# Patient Record
Sex: Female | Born: 1960
Health system: Southern US, Community
[De-identification: ages and names within clinical notes are randomized; demographics above are authoritative.]

## PROBLEM LIST (undated history)

## (undated) DIAGNOSIS — I341 Nonrheumatic mitral (valve) prolapse: Secondary | ICD-10-CM

## (undated) DIAGNOSIS — K219 Gastro-esophageal reflux disease without esophagitis: Secondary | ICD-10-CM

## (undated) DIAGNOSIS — D649 Anemia, unspecified: Secondary | ICD-10-CM

## (undated) DIAGNOSIS — E2740 Unspecified adrenocortical insufficiency: Secondary | ICD-10-CM

## (undated) DIAGNOSIS — G8929 Other chronic pain: Secondary | ICD-10-CM

## (undated) DIAGNOSIS — I951 Orthostatic hypotension: Secondary | ICD-10-CM

## (undated) DIAGNOSIS — J449 Chronic obstructive pulmonary disease, unspecified: Secondary | ICD-10-CM

## (undated) DIAGNOSIS — N6019 Diffuse cystic mastopathy of unspecified breast: Secondary | ICD-10-CM

## (undated) DIAGNOSIS — F419 Anxiety disorder, unspecified: Secondary | ICD-10-CM

## (undated) DIAGNOSIS — M810 Age-related osteoporosis without current pathological fracture: Secondary | ICD-10-CM

## (undated) DIAGNOSIS — G629 Polyneuropathy, unspecified: Secondary | ICD-10-CM

## (undated) DIAGNOSIS — Z9889 Other specified postprocedural states: Secondary | ICD-10-CM

## (undated) DIAGNOSIS — M8700 Idiopathic aseptic necrosis of unspecified bone: Secondary | ICD-10-CM

## (undated) DIAGNOSIS — E039 Hypothyroidism, unspecified: Secondary | ICD-10-CM

## (undated) DIAGNOSIS — J984 Other disorders of lung: Secondary | ICD-10-CM

## (undated) DIAGNOSIS — R112 Nausea with vomiting, unspecified: Secondary | ICD-10-CM

## (undated) DIAGNOSIS — M797 Fibromyalgia: Secondary | ICD-10-CM

## (undated) DIAGNOSIS — R0602 Shortness of breath: Secondary | ICD-10-CM

## (undated) DIAGNOSIS — D869 Sarcoidosis, unspecified: Secondary | ICD-10-CM

## (undated) HISTORY — DX: Fibromyalgia: M79.7

## (undated) HISTORY — DX: Gastro-esophageal reflux disease without esophagitis: K21.9

## (undated) HISTORY — PX: TUBAL LIGATION: SHX77

## (undated) HISTORY — DX: Diffuse cystic mastopathy of unspecified breast: N60.19

## (undated) HISTORY — DX: Nonrheumatic mitral (valve) prolapse: I34.1

## (undated) HISTORY — DX: Sarcoidosis, unspecified: D86.9

## (undated) HISTORY — DX: Other disorders of lung: J98.4

## (undated) SURGERY — EGD (ESOPHAGOGASTRODUODENOSCOPY)
Anesthesia: Moderate Sedation

---

## 1998-04-05 ENCOUNTER — Ambulatory Visit (HOSPITAL_COMMUNITY): Admission: RE | Admit: 1998-04-05 | Discharge: 1998-04-05 | Payer: Self-pay | Admitting: Family Medicine

## 1998-07-04 ENCOUNTER — Ambulatory Visit (HOSPITAL_COMMUNITY): Admission: RE | Admit: 1998-07-04 | Discharge: 1998-07-04 | Payer: Self-pay | Admitting: Internal Medicine

## 1998-07-04 ENCOUNTER — Encounter: Admission: RE | Admit: 1998-07-04 | Discharge: 1998-07-04 | Payer: Self-pay | Admitting: Internal Medicine

## 1998-10-17 ENCOUNTER — Ambulatory Visit (HOSPITAL_COMMUNITY): Admission: RE | Admit: 1998-10-17 | Discharge: 1998-10-17 | Payer: Self-pay

## 1998-11-02 ENCOUNTER — Ambulatory Visit (HOSPITAL_COMMUNITY): Admission: RE | Admit: 1998-11-02 | Discharge: 1998-11-02 | Payer: Self-pay | Admitting: *Deleted

## 1999-05-11 ENCOUNTER — Encounter: Admission: RE | Admit: 1999-05-11 | Discharge: 1999-05-11 | Payer: Self-pay | Admitting: Hematology and Oncology

## 2000-12-05 ENCOUNTER — Ambulatory Visit (HOSPITAL_COMMUNITY): Admission: RE | Admit: 2000-12-05 | Discharge: 2000-12-05 | Payer: Self-pay | Admitting: Family Medicine

## 2000-12-05 ENCOUNTER — Encounter: Payer: Self-pay | Admitting: *Deleted

## 2001-03-18 ENCOUNTER — Encounter: Payer: Self-pay | Admitting: Family Medicine

## 2001-03-18 ENCOUNTER — Ambulatory Visit (HOSPITAL_COMMUNITY): Admission: RE | Admit: 2001-03-18 | Discharge: 2001-03-18 | Payer: Self-pay | Admitting: Family Medicine

## 2001-05-08 ENCOUNTER — Encounter: Payer: Self-pay | Admitting: *Deleted

## 2001-05-08 ENCOUNTER — Emergency Department (HOSPITAL_COMMUNITY): Admission: EM | Admit: 2001-05-08 | Discharge: 2001-05-08 | Payer: Self-pay | Admitting: *Deleted

## 2001-05-27 ENCOUNTER — Other Ambulatory Visit: Admission: RE | Admit: 2001-05-27 | Discharge: 2001-05-27 | Payer: Self-pay | Admitting: *Deleted

## 2002-06-13 ENCOUNTER — Emergency Department (HOSPITAL_COMMUNITY): Admission: EM | Admit: 2002-06-13 | Discharge: 2002-06-14 | Payer: Self-pay | Admitting: Emergency Medicine

## 2002-06-13 ENCOUNTER — Encounter: Payer: Self-pay | Admitting: Emergency Medicine

## 2002-08-13 ENCOUNTER — Ambulatory Visit (HOSPITAL_COMMUNITY): Admission: RE | Admit: 2002-08-13 | Discharge: 2002-08-13 | Payer: Self-pay | Admitting: *Deleted

## 2002-08-13 ENCOUNTER — Encounter: Payer: Self-pay | Admitting: *Deleted

## 2002-10-06 ENCOUNTER — Other Ambulatory Visit: Admission: RE | Admit: 2002-10-06 | Discharge: 2002-10-06 | Payer: Self-pay | Admitting: *Deleted

## 2003-03-24 ENCOUNTER — Encounter: Payer: Self-pay | Admitting: *Deleted

## 2003-03-24 ENCOUNTER — Ambulatory Visit (HOSPITAL_COMMUNITY): Admission: RE | Admit: 2003-03-24 | Discharge: 2003-03-24 | Payer: Self-pay | Admitting: *Deleted

## 2003-07-19 ENCOUNTER — Inpatient Hospital Stay (HOSPITAL_COMMUNITY): Admission: AD | Admit: 2003-07-19 | Discharge: 2003-07-19 | Payer: Self-pay | Admitting: Obstetrics & Gynecology

## 2003-09-08 ENCOUNTER — Ambulatory Visit (HOSPITAL_COMMUNITY): Admission: RE | Admit: 2003-09-08 | Discharge: 2003-09-08 | Payer: Self-pay | Admitting: *Deleted

## 2003-11-07 ENCOUNTER — Emergency Department (HOSPITAL_COMMUNITY): Admission: EM | Admit: 2003-11-07 | Discharge: 2003-11-07 | Payer: Self-pay | Admitting: Emergency Medicine

## 2003-11-09 ENCOUNTER — Encounter (HOSPITAL_COMMUNITY): Admission: RE | Admit: 2003-11-09 | Discharge: 2003-12-09 | Payer: Self-pay | Admitting: Family Medicine

## 2003-11-22 ENCOUNTER — Emergency Department (HOSPITAL_COMMUNITY): Admission: EM | Admit: 2003-11-22 | Discharge: 2003-11-22 | Payer: Self-pay | Admitting: Emergency Medicine

## 2004-05-15 ENCOUNTER — Other Ambulatory Visit: Admission: RE | Admit: 2004-05-15 | Discharge: 2004-05-15 | Payer: Self-pay | Admitting: Obstetrics and Gynecology

## 2004-05-18 ENCOUNTER — Encounter: Admission: RE | Admit: 2004-05-18 | Discharge: 2004-05-18 | Payer: Self-pay | Admitting: Obstetrics and Gynecology

## 2004-06-15 ENCOUNTER — Encounter (INDEPENDENT_AMBULATORY_CARE_PROVIDER_SITE_OTHER): Payer: Self-pay | Admitting: Specialist

## 2004-06-15 ENCOUNTER — Ambulatory Visit (HOSPITAL_BASED_OUTPATIENT_CLINIC_OR_DEPARTMENT_OTHER): Admission: RE | Admit: 2004-06-15 | Discharge: 2004-06-15 | Payer: Self-pay | Admitting: General Surgery

## 2004-06-15 ENCOUNTER — Ambulatory Visit (HOSPITAL_COMMUNITY): Admission: RE | Admit: 2004-06-15 | Discharge: 2004-06-15 | Payer: Self-pay | Admitting: General Surgery

## 2004-08-04 ENCOUNTER — Ambulatory Visit: Payer: Self-pay | Admitting: Internal Medicine

## 2005-01-19 ENCOUNTER — Ambulatory Visit (HOSPITAL_COMMUNITY): Admission: RE | Admit: 2005-01-19 | Discharge: 2005-01-19 | Payer: Self-pay | Admitting: Specialist

## 2005-01-24 ENCOUNTER — Ambulatory Visit: Payer: Self-pay | Admitting: Internal Medicine

## 2005-03-16 ENCOUNTER — Encounter: Admission: RE | Admit: 2005-03-16 | Discharge: 2005-03-16 | Payer: Self-pay | Admitting: General Surgery

## 2005-03-23 ENCOUNTER — Encounter: Admission: RE | Admit: 2005-03-23 | Discharge: 2005-03-23 | Payer: Self-pay | Admitting: General Surgery

## 2005-11-11 ENCOUNTER — Ambulatory Visit (HOSPITAL_COMMUNITY): Admission: RE | Admit: 2005-11-11 | Discharge: 2005-11-11 | Payer: Self-pay | Admitting: Family Medicine

## 2005-11-11 ENCOUNTER — Emergency Department (HOSPITAL_COMMUNITY): Admission: EM | Admit: 2005-11-11 | Discharge: 2005-11-11 | Payer: Self-pay | Admitting: Family Medicine

## 2005-11-12 ENCOUNTER — Ambulatory Visit: Payer: Self-pay | Admitting: Internal Medicine

## 2005-11-13 ENCOUNTER — Ambulatory Visit: Payer: Self-pay | Admitting: Internal Medicine

## 2006-11-26 ENCOUNTER — Encounter: Admission: RE | Admit: 2006-11-26 | Discharge: 2006-11-26 | Payer: Self-pay | Admitting: Occupational Medicine

## 2007-02-18 ENCOUNTER — Encounter: Admission: RE | Admit: 2007-02-18 | Discharge: 2007-02-18 | Payer: Self-pay | Admitting: Obstetrics and Gynecology

## 2007-04-08 ENCOUNTER — Emergency Department (HOSPITAL_COMMUNITY): Admission: EM | Admit: 2007-04-08 | Discharge: 2007-04-08 | Payer: Self-pay | Admitting: Emergency Medicine

## 2007-04-11 ENCOUNTER — Ambulatory Visit: Payer: Self-pay | Admitting: Internal Medicine

## 2007-04-11 LAB — CONVERTED CEMR LAB
ALT: 16 units/L (ref 0–35)
AST: 18 units/L (ref 0–37)
Albumin: 3.3 g/dL — ABNORMAL LOW (ref 3.5–5.2)
Alkaline Phosphatase: 63 units/L (ref 39–117)
Angiotensin 1 Converting Enzyme: 82 units/L — ABNORMAL HIGH (ref 9–67)
Bilirubin, Direct: 0.1 mg/dL (ref 0.0–0.3)
Sed Rate: 30 mm/hr — ABNORMAL HIGH (ref 0–25)
TSH: 2.48 microintl units/mL (ref 0.35–5.50)
Total Bilirubin: 0.8 mg/dL (ref 0.3–1.2)
Total Protein: 7.2 g/dL (ref 6.0–8.3)

## 2007-08-06 ENCOUNTER — Encounter: Admission: RE | Admit: 2007-08-06 | Discharge: 2007-08-06 | Payer: Self-pay | Admitting: Obstetrics and Gynecology

## 2007-08-23 ENCOUNTER — Emergency Department (HOSPITAL_COMMUNITY): Admission: EM | Admit: 2007-08-23 | Discharge: 2007-08-23 | Payer: Self-pay | Admitting: Family Medicine

## 2007-09-12 ENCOUNTER — Ambulatory Visit (HOSPITAL_COMMUNITY): Payer: Self-pay | Admitting: Psychology

## 2007-09-23 ENCOUNTER — Ambulatory Visit (HOSPITAL_COMMUNITY): Payer: Self-pay | Admitting: Psychology

## 2007-11-21 ENCOUNTER — Emergency Department (HOSPITAL_COMMUNITY): Admission: EM | Admit: 2007-11-21 | Discharge: 2007-11-21 | Payer: Self-pay | Admitting: Family Medicine

## 2008-03-09 ENCOUNTER — Encounter: Admission: RE | Admit: 2008-03-09 | Discharge: 2008-03-09 | Payer: Self-pay | Admitting: Obstetrics and Gynecology

## 2008-06-21 DIAGNOSIS — D869 Sarcoidosis, unspecified: Secondary | ICD-10-CM | POA: Insufficient documentation

## 2008-10-15 ENCOUNTER — Ambulatory Visit (HOSPITAL_COMMUNITY): Payer: Self-pay | Admitting: Psychology

## 2008-11-20 ENCOUNTER — Emergency Department (HOSPITAL_COMMUNITY): Admission: EM | Admit: 2008-11-20 | Discharge: 2008-11-21 | Payer: Self-pay | Admitting: Emergency Medicine

## 2008-11-22 ENCOUNTER — Ambulatory Visit: Payer: Self-pay | Admitting: Internal Medicine

## 2008-11-22 ENCOUNTER — Encounter: Payer: Self-pay | Admitting: Adult Health

## 2008-11-22 DIAGNOSIS — R079 Chest pain, unspecified: Secondary | ICD-10-CM | POA: Insufficient documentation

## 2008-11-22 LAB — CONVERTED CEMR LAB
ALT: 17 units/L (ref 0–35)
AST: 19 units/L (ref 0–37)
Albumin: 3.4 g/dL — ABNORMAL LOW (ref 3.5–5.2)
Alkaline Phosphatase: 59 units/L (ref 39–117)
Angiotensin 1 Converting Enzyme: 55 units/L (ref 9–67)
BUN: 9 mg/dL (ref 6–23)
Bilirubin, Direct: 0.1 mg/dL (ref 0.0–0.3)
CO2: 30 meq/L (ref 19–32)
Calcium: 8.9 mg/dL (ref 8.4–10.5)
Chloride: 109 meq/L (ref 96–112)
Creatinine, Ser: 0.8 mg/dL (ref 0.4–1.2)
GFR calc non Af Amer: 82.03 mL/min (ref >60)
Glucose, Bld: 87 mg/dL (ref 70–99)
Potassium: 4.1 meq/L (ref 3.5–5.1)
Sodium: 142 meq/L (ref 135–145)
TSH: 2.4 microintl units/mL (ref 0.35–5.50)
Total Bilirubin: 0.7 mg/dL (ref 0.3–1.2)
Total Protein: 6.7 g/dL (ref 6.0–8.3)

## 2008-11-25 ENCOUNTER — Ambulatory Visit: Payer: Self-pay | Admitting: Internal Medicine

## 2009-05-05 ENCOUNTER — Encounter: Admission: RE | Admit: 2009-05-05 | Discharge: 2009-05-05 | Payer: Self-pay | Admitting: Obstetrics and Gynecology

## 2009-05-12 ENCOUNTER — Encounter: Admission: RE | Admit: 2009-05-12 | Discharge: 2009-05-12 | Payer: Self-pay | Admitting: Obstetrics and Gynecology

## 2009-06-20 ENCOUNTER — Ambulatory Visit: Payer: Self-pay | Admitting: Internal Medicine

## 2009-06-20 DIAGNOSIS — J069 Acute upper respiratory infection, unspecified: Secondary | ICD-10-CM | POA: Insufficient documentation

## 2009-09-21 ENCOUNTER — Ambulatory Visit: Admission: RE | Admit: 2009-09-21 | Discharge: 2009-09-21 | Payer: Self-pay | Admitting: Cardiovascular Disease

## 2009-10-01 DIAGNOSIS — R0689 Other abnormalities of breathing: Secondary | ICD-10-CM | POA: Insufficient documentation

## 2010-01-10 ENCOUNTER — Telehealth (INDEPENDENT_AMBULATORY_CARE_PROVIDER_SITE_OTHER): Payer: Self-pay | Admitting: *Deleted

## 2010-01-10 ENCOUNTER — Ambulatory Visit: Payer: Self-pay | Admitting: Internal Medicine

## 2010-01-10 ENCOUNTER — Encounter: Payer: Self-pay | Admitting: Internal Medicine

## 2010-02-13 ENCOUNTER — Telehealth (INDEPENDENT_AMBULATORY_CARE_PROVIDER_SITE_OTHER): Payer: Self-pay | Admitting: *Deleted

## 2010-02-14 ENCOUNTER — Telehealth (INDEPENDENT_AMBULATORY_CARE_PROVIDER_SITE_OTHER): Payer: Self-pay | Admitting: *Deleted

## 2010-03-03 ENCOUNTER — Encounter: Payer: Self-pay | Admitting: Internal Medicine

## 2010-03-07 ENCOUNTER — Telehealth (INDEPENDENT_AMBULATORY_CARE_PROVIDER_SITE_OTHER): Payer: Self-pay | Admitting: *Deleted

## 2010-04-26 ENCOUNTER — Telehealth: Payer: Self-pay | Admitting: Internal Medicine

## 2010-05-23 ENCOUNTER — Ambulatory Visit: Payer: Self-pay | Admitting: Internal Medicine

## 2010-05-23 ENCOUNTER — Encounter: Payer: Self-pay | Admitting: Adult Health

## 2010-05-27 ENCOUNTER — Emergency Department (HOSPITAL_COMMUNITY): Admission: EM | Admit: 2010-05-27 | Discharge: 2010-05-28 | Payer: Self-pay | Admitting: Emergency Medicine

## 2010-05-29 ENCOUNTER — Ambulatory Visit: Payer: Self-pay | Admitting: Internal Medicine

## 2010-05-29 ENCOUNTER — Telehealth (INDEPENDENT_AMBULATORY_CARE_PROVIDER_SITE_OTHER): Payer: Self-pay | Admitting: *Deleted

## 2010-05-29 ENCOUNTER — Encounter: Payer: Self-pay | Admitting: Adult Health

## 2010-05-29 DIAGNOSIS — N390 Urinary tract infection, site not specified: Secondary | ICD-10-CM | POA: Insufficient documentation

## 2010-05-29 LAB — CONVERTED CEMR LAB
Bilirubin Urine: NEGATIVE
Ketones, ur: NEGATIVE mg/dL
Nitrite: NEGATIVE
Specific Gravity, Urine: 1.02 (ref 1.000–1.030)
Total Protein, Urine: NEGATIVE mg/dL
Urine Glucose: NEGATIVE mg/dL
Urobilinogen, UA: 0.2 (ref 0.0–1.0)
pH: 5.5 (ref 5.0–8.0)

## 2010-05-30 ENCOUNTER — Encounter: Payer: Self-pay | Admitting: Adult Health

## 2010-05-30 DIAGNOSIS — J209 Acute bronchitis, unspecified: Secondary | ICD-10-CM | POA: Insufficient documentation

## 2010-06-09 ENCOUNTER — Telehealth (INDEPENDENT_AMBULATORY_CARE_PROVIDER_SITE_OTHER): Payer: Self-pay | Admitting: *Deleted

## 2010-06-12 ENCOUNTER — Telehealth (INDEPENDENT_AMBULATORY_CARE_PROVIDER_SITE_OTHER): Payer: Self-pay | Admitting: *Deleted

## 2010-08-15 ENCOUNTER — Encounter
Admission: RE | Admit: 2010-08-15 | Discharge: 2010-08-15 | Payer: Self-pay | Source: Home / Self Care | Attending: Obstetrics and Gynecology | Admitting: Obstetrics and Gynecology

## 2010-09-16 ENCOUNTER — Encounter: Payer: Self-pay | Admitting: Obstetrics and Gynecology

## 2010-09-17 ENCOUNTER — Encounter: Payer: Self-pay | Admitting: Specialist

## 2010-09-17 ENCOUNTER — Encounter: Payer: Self-pay | Admitting: *Deleted

## 2010-09-21 ENCOUNTER — Encounter
Admission: RE | Admit: 2010-09-21 | Discharge: 2010-09-21 | Payer: Self-pay | Source: Home / Self Care | Attending: Obstetrics and Gynecology | Admitting: Obstetrics and Gynecology

## 2010-09-21 ENCOUNTER — Other Ambulatory Visit: Payer: Self-pay | Admitting: Diagnostic Radiology

## 2010-09-26 NOTE — Progress Notes (Signed)
Summary: nexium  ok but needs GI eval  Phone Note From Pharmacy   Caller: [facility]* Call For: wert  Summary of Call: needs new rx stating she takes this 3 times a day her nexium Initial call taken by: Lacinda Axon,  February 14, 2010 4:27 PM  Follow-up for Phone Call        per last ov with MW on 01-10-2010, pt instructions state to take Nexium two times a day until feeling better and then go back to once daily.  I don't see any mention of MW advising taking 3 tabs daily.  LM with pharmacy to get clarification.  Aundra Millet Reynolds LPN  February 14, 2010 4:35 PM   Returning call.Darletta Moll  February 14, 2010 4:38 PM   Additional Follow-up for Phone Call Additional follow up Details #1::        ashley with [facility] pharm called again stating that pt states that instead of doubling the nexium until better she tripled, and now does not have enough to last her until she can refill.    called spoke with patient who states that he was indeed taking the nexium three times a day, but continues to take up to 2 daily.  pt states she is okay with buying an OTC ppi until she can refill her script for nexium.  pt aware MW out of office until tomorrow afternoon.  Dr. Sherene Sires, do you want pt to come in for med cal to get this straight? Boone Master CNA/MA  February 14, 2010 5:03 PM  she's only on the nexium so shouldn't need a med calendar but does need GI referral if can't get by with one nexium a day. Please refer to our GI docs and let her have whatever she needs until that appt Additional Follow-up by: Nyoka Cowden MD,  February 15, 2010 8:44 AM    Additional Follow-up for Phone Call Additional follow up Details #2::    [email or web address] [facility]  February 15, 2010 9:34   Returning [place] call, told pt she would call back per leigh.Darletta Moll  February 15, 2010 9:54 AM pt refused gi ref. will only take 1 a day and will just ust otc meds until she can refill for ins. to pay, told pt if she changed her mind  about ref. to call back--she said she would  Follow-up by: Lumi Winslett CMA,  February 15, 2010 10:04 AM

## 2010-09-26 NOTE — Letter (Signed)
Summary: [facility]   Imported By: Lester [place] 03/23/2010 08:17:56  _____________________________________________________________________  External Attachment:    Type:   Image     Comment:   External Document

## 2010-09-26 NOTE — Progress Notes (Signed)
Summary: req to be seen today  Phone Note Call from Patient Call back at Home Phone [phone]   Caller: Patient Call For: wert Summary of Call: pt requests to be seen today for chest congestion. (pt has sarcoid). no avail w/ mw or tp (or any other dr.) [phone] Initial call taken by: Tivis Ringer, CNA,  Jan 10, 2010 10:23 AM  Follow-up for Phone Call        Nicholes Calamity has a 12:00 avail today.  Aundra Millet Reynolds LPN  Jan 10, 2010 10:57 AM   pt has scheduled ov for today at 1200 with VS.  will sign off on note.  Gweneth Dimitri RN  Jan 10, 2010 11:13 AM

## 2010-09-26 NOTE — Progress Notes (Signed)
Summary: ER follow up - appt w/ TP 10.3.11  Phone Note Call from Patient Call back at Work Phone [phone]   Caller: Patient Call For: parrett Summary of Call: Pt saw TP on 9/27, states she not any better, pls advise.//[facility] Initial call taken by: Darletta Moll,  May 29, 2010 9:46 AM  Follow-up for Phone Call        [facility] Vernie Murders  May 29, 2010 10:13 AM  Spoke with pt.  She states that she has finished the zpack given on 9/27 and symptoms have not improved.  She states that she had to go to ER with vomiting over the wkend and was given fluids and sent home.  Her sore throat and cough are the same and still has congestion in chest and head.  She also has had fever x 2 days.  Would like something else called in.  Will forward to TP.  Pls advise, thanks Follow-up by: Vernie Murders,  May 29, 2010 10:27 AM  Additional Follow-up for Phone Call Additional follow up Details #1::        ov this evening at 4pm Additional Follow-up by: Rubye Oaks NP,  May 29, 2010 10:50 AM    Additional Follow-up for Phone Call Additional follow up Details #2::    appt scheduled in IDX. Boone Master CNA/MA  May 29, 2010 10:55 AM

## 2010-09-26 NOTE — Assessment & Plan Note (Signed)
Summary: Pulmonary/ acute ov ? uri   Primary Provider/Referring Provider:  [facility]  CC:  Acute visit.  Pt c/o cough x several days.  She states that cough is non prod and but she coughs to the point of vomiting.  Also c/o muscle aches.  No fever.Marland Kitchen  History of Present Illness: 75 yowf never smoker who has a history of sarcoid w/ positive biopsy for noncaseating granulomatous inflammation in 1994. Prev. on chronic steroids, off for >5 yrs w. follow w/ no active sarcoid. Has hx of atypical chest pain evaluated in November 12, 2005 and felt to be irritable bowel syndrome and GERD.     November 22, 2008 --last seen 2008. Presents for follow up from ER on 11/20/08 for episode of chest pain, dyspnea. Has hx of extensive CAD on mother's side of family. CXR showed chronic changes. Cardiac enzymes neg.  seen by [facility] rx with a as needed med for cp, resolved, does not know what her as needed is.  June 20, 2009 Acute visit.  Pt c/o cough x several days.  She states that cough is non prod, but she coughs to the point of vomiting.  Also c/o muscle aches.  Took flu shot at work > 1 week prior to onset.  Pt denies any significant sore throat, dysphagia, itching, sneezing,  nasal congestion or excess secretions,  fever, chills, sweats, unintended wt loss, pleuritic or exertional cp, hempoptysis, change in activity tolerance  orthopnea pnd or leg swelling.  No ocular c/os or rash.  Current Medications (verified): 1)  Nexium 40 Mg Cpdr (Esomeprazole Magnesium) .Marland Kitchen.. 1 By Mouth Once Daily 2)  Robitussin Dm 100-10 Mg/38ml Syrp (Dextromethorphan-Guaifenesin) .... As Directed As Needed  Allergies (verified): No Known Drug Allergies  Past History:  Past Medical History: Sarcoidosis-    -positive biopsy for NCG   1994. Prev. on steroids x several years only Breast fibrocystic-prev. bx  Vital Signs:  Patient profile:   50 year old female Weight:      188 pounds O2 Sat:      95 % on Room air Temp:      97.8 degrees F oral Pulse rate:   75 / minute BP sitting:   112 / 74  (left arm)  Vitals Entered By: Vernie Murders (June 20, 2009 10:51 AM)  O2 Flow:  Room air  Physical Exam  Additional Exam:  amb wf nad wt  187 > 188 June 20, 2009 HEENT: nl dentition, turbinates, and orophanx. Nl external ear canals without cough reflex NECK :  without JVD/Nodes/TM/ nl carotid upstrokes bilaterally LUNGS: no acc muscle use, clear to A and P bilaterally without cough on insp or exp maneuvers CV:  RRR  no s3 or murmur or increase in P2, no edema  ABD:  soft and nontender with nl excursion in the supine position. No bruits or organomegaly, bowel sounds nl MS:  warm without deformities, calf tenderness, cyanosis or clubbing SKIN: warm and dry without lesions   NEURO:  alert, approp, no deficits     Impression & Recommendations:  Problem # 1:  URI, ACUTE (ICD-465.9)  Explained natural h/o uri and why it's necessary in patients at risk to rx short term with PPI to reduce risk of evolving cyclical cough triggered by epithelial injury and a heightened sensitivty to the effects of any upper airway irritants,  most importantly acid - related   Clearly if she's coughing so hard she's vomiting she must be refluxing.  Each maintenance medication  was reviewed in detail including most importantly the difference between maintenance and as needed and under what circumstances the prns are to be used.  In addition, these two groups of medications (for which the patient should keep up with refills) were distinguished from a third group, namely those meds that are used only short term with the intent to complete a course of therapy and then not refill them without further evaluation. See instructions for specific recommendations   Orders: Est. Patient Level IV ([address])  Problem # 2:  SARCOIDOSIS (ICD-135) no evidence of active dz  Medications Added to Medication List This Visit: 1)  Robitussin Dm 100-10  Mg/28ml Syrp (Dextromethorphan-guaifenesin) .... As directed as needed 2)  Tramadol Hcl 50 Mg Tabs (Tramadol hcl) .... One to two by mouth every 4-6 hours as needed for cough, pain  Patient Instructions: 1)  Take robitussin dm 2 tsp every 4 hours  and add tramadol 50 mg up to 2 every 4 hours to suppress the urge to cough. Swallowing water or using ice chips/non mint and menthol containing candies (such as lifesavers or sugarless jolly ranchers) are also effective. 2)  GERD (gastroesophageal reflux disease) was discussed. It is a common cause of respiratory symptoms. It commonly presents in the absence of heartburn. GERD can be treated with medication, but also with lifestyle changes including avoidance of late meals, excessive alcohol, smoking cessation, and avoid fatty foods, chocolate, peppermint, colas, red wine, and acidic juices such as orange juice. NO MINT OR MENTHOL PRODUCTS(no cough drops) 3)  USE SUGARLESS CANDY INSTEAD (jolley ranchers)   4)  whenever you have either chest pain or any respiratory complaints restart nexium Take one 30-60 min before first and last meals of the day until better x 2 weeks. 5)    Prescriptions: TRAMADOL HCL 50 MG  TABS (TRAMADOL HCL) One to two by mouth every 4-6 hours as needed for cough, pain  #40 x 0   Entered and Authorized by:   Nyoka Cowden MD   Signed by:   Nyoka Cowden MD on 06/20/2009   Method used:   Electronically to        [facility]* (retail)       [address]       [address] Shipping/mailing       [place], [place]  [address]       Ph: [phone]       Fax: [phone]   RxID:   [id number]

## 2010-09-26 NOTE — Progress Notes (Signed)
Summary: congestion- requests to be seen today---  Phone Note Call from Patient Call back at Home Phone [phone]   Caller: Patient Call For: wert Summary of Call: pt requests to be seen today. cough/ congestion.  Initial call taken by: Tivis Ringer, CNA,  June 09, 2010 11:22 AM  Follow-up for Phone Call        ATC pt at home #.  Was told pt was at work.  ATC pt at work #  [id number].  Aundra Millet Reynolds LPN  June 09, 2010 11:39 AM   pt called back.  pt asked if Cynethia P was here today and I informed her TP was off today but that I had other physician's in the office that could address her message.  However, pt refused to give me her Sx or info what this message what regarding and stated she will wait until Monday and call back.  Aundra Millet Reynolds LPN  June 09, 2010 1:42 PM

## 2010-09-26 NOTE — Progress Notes (Signed)
Summary: requests to have mother in law seen today-new consult  Phone Note Call from Patient   Caller: Patient Call For: wert Summary of Call: Wants to know if MW can see her mother-in-law Aurore Redinger dob 11/27/30) for a consult asap. I offered her first available, but she wants her to be seen sooner, pls advise. Her mother-in-law is not in emr. i'm sending this back to triage. forward where this needs to be - thanks.  Initial call taken by: Darletta Moll,  March 07, 2010 8:36 AM  Follow-up for Phone Call        dr wert is this something you want me to work in?   Mychael Soots [facility]  March 07, 2010 8:56 AM   Aaryanna called back, stated they are leaving for the beach at 12p, and they want to know before they leave if MW will see Talbert Nan. Darletta Moll  March 07, 2010 9:36 AM   Additional Follow-up for Phone Call Additional follow up Details #1::        Pearlean Hussey has called for a 3rd time requesting to have her mother-in-law seen today (new consult- recent pna-sob). i made an appt w/ mw for 7/26. daughter-in-law is going to take her to urgent care in the meantime today but wants to hear from someone next monday to see if this appt w/ dr wert can be moved up sooner. she still wants to be called to see if someone can see her here today.  Tivis Ringer, CNA  March 07, 2010 11:42 AM Additional Follow-up by: Tivis Ringer, CNA,  March 07, 2010 11:42 AM    Additional Follow-up for Phone Call Additional follow up Details #2::    pt will see dr wert tomorrow 7/13 at 11:55 per tammyn Kulaga- ok per Brendan davis- RN slot. no call back needed now. Tivis Ringer, CNA  March 07, 2010 12:14 PM

## 2010-09-26 NOTE — Progress Notes (Signed)
Summary: speak to nurse-MTCBx1  Phone Note Call from Patient Call back at Work Phone [phone]   Caller: Patient Call For: parrett Reason for Call: Talk to Nurse Summary of Call: Wants to speak to nurse in ref to appt she cancelled today. Initial call taken by: Darletta Moll,  June 12, 2010 10:26 AM  Follow-up for Phone Call        [email or web address]. Carron Curie CMA  June 12, 2010 11:51 AM   lmtcb x 2 [facility]  June 15, 2010 4:08 PM  pt wanted to say she cx appt so she could have more time to get better if still hurting next week she will make appt with mw Follow-up by: Donnis Pecha CMA,  June 16, 2010 11:57 AM

## 2010-09-26 NOTE — Letter (Signed)
Summary: Out of Work  [employer]  [address]   [place], [place] [address]   Phone: [phone]  Fax: [phone]    Jan 10, 2010   Employee:  Kathleen [name]    To Whom It May Concern:   For Medical reasons, please excuse the above named employee from work for the following dates:  Start:   May 17th, 2011  End:   May 19th, 2011  If you need additional information, please feel free to contact our office.         Sincerely,    Vernie Murders

## 2010-09-26 NOTE — Assessment & Plan Note (Signed)
Summary: Acute NP office visit - ER follow up   Primary Provider/Referring Provider:  [facility]  CC:  went to ER on Sat for n/v, still having prod cough with green/yellow mucus, hoarseness - states tramadol hasn't helped, and finished zpak yesterday.  History of Present Illness: 57  yowf never smoker who has a history of sarcoid w/ positive biopsy for noncaseating granulomatous inflammation in 1994. Prev. on chronic steroids off since around 2005  w. follow w/ no active sarcoid. Has hx of atypical chest pain evaluated in November 12, 2005 and felt to be irritable bowel syndrome and GERD.     June 20, 2009 Acute visit.  Pt c/o cough x several days.  She states that cough is non prod, but she coughs to the point of vomiting.  Also c/o muscle aches.  Took flu shot at work > 1 week prior to onset.  rec rx as uri/ viral and suppress acid reflux.  Jan 10, 2010 Acute visit.  Pt c/o head and chest congestion x 2 days with non prod.  She also has had some wheezing when she lies down and increased SOB with exertion. cough is dry to point of vomiting -  did not activate plan for gerd rx as rec and rob dm no t helping.  no purulent sputum, HA, scratchy throat.   May 23, 2010 --Presents for an acute office visit. Complains of sinus  and chest congestion for 3 days. Also has sore throat and non prod cough. Coughs so hard she near vomits. - Body aches -and hoarseness for last 3 days. She is out of  Tramadol. Blowing out green /yellow mucus. OTC not helping. Dx URI-tx w/ zpack.   May 29, 2010 --Presents for work in visit, Complains of persistent symptoms. Seen last week with bronchitic complaints tx w/ zpack. Has had no improvment in cough or congestion. Developed n/v on 05/27/10 w/ subsequent ER visit. Tx w/ IVF and phenergan. XRay with no acute changes. labs showed ? positive udip. repeated today w/ some bacteria. labs were unrevealing. She has not filled her  filled phenergan. No further  vomitting but no appetitie and lots of nausea. Still has very deep cough. with   green/yellow mucus, hoarseness - states tramadol hasn't helped, finished zpak yesterday. Denies chest pain, orthopnea, hemoptysis, fever,  diarrhea  edema, headache.   Medications Prior to Update: 1)  Nexium 40 Mg Cpdr (Esomeprazole Magnesium) .Marland Kitchen.. 1 By Mouth Once Daily 2)  Robitussin Dm 100-10 Mg/75ml Syrp (Dextromethorphan-Guaifenesin) .... As Directed As Needed 3)  Tramadol Hcl 50 Mg  Tabs (Tramadol Hcl) .... One To Two By Mouth Every 4-6 Hours As Needed For Cough or Pain 4)  Xanax 0.5 Mg Tabs (Alprazolam) .... 1/2 By Mouth Every 6 Hours During The Day As Needed  For Anxiety and 1 By Mouth At Bedtime For Sleep If Needed 5)  Zithromax Z-Pak 250 Mg Tabs (Azithromycin) .... Take As Directed  Current Medications (verified): 1)  Nexium 40 Mg Cpdr (Esomeprazole Magnesium) .Marland Kitchen.. 1 By Mouth Once Daily 2)  Robitussin Dm 100-10 Mg/81ml Syrp (Dextromethorphan-Guaifenesin) .... As Directed As Needed 3)  Tramadol Hcl 50 Mg  Tabs (Tramadol Hcl) .... One To Two By Mouth Every 4-6 Hours As Needed For Cough or Pain 4)  Xanax 0.5 Mg Tabs (Alprazolam) .... 1/2 By Mouth Every 6 Hours During The Day As Needed  For Anxiety and 1 By Mouth At Bedtime For Sleep If Needed  Allergies (verified): 1)  ! Codeine  Past History:  Past Medical History: Last updated: 01/10/2010 Sarcoidosis    -positive biopsy for NCG   1994. Prev. on steroids x several years only Breast fibrocystic-prev. 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 Married 1 kid-age 87 never smoker  Risk Factors: Smoking Status: never (06/21/2008)  Review of Systems      See HPI  Vital Signs:  Patient profile:   50 year old  female Height:      70 inches Weight:      184.19 pounds BMI:     26.52 O2 Sat:      98 % on Room air Temp:     97.9 degrees F oral Pulse rate:   57 / minute BP sitting:   96 / 62  (left arm) Cuff size:   regular  Vitals Entered By: Boone Master CNA/MA (May 29, 2010 4:04 PM)  O2 Flow:  Room air CC: went to ER on Sat for n/v, still having prod cough with green/yellow mucus, hoarseness - states tramadol hasn't helped, finished zpak yesterday Is Patient Diabetic? No Comments Medications reviewed with patient Daytime contact number verified with patient. Boone Master CNA/MA  May 29, 2010 4:03 PM    Physical Exam  Additional Exam:  amb wf nad  wt   188 June 20, 2009 > 187 Jan 10, 2010 >>185 05/23/10>>184 10/4  HEENT: nl dentition,  , and orophanx. Nl external ear canals without cough reflex, clear nasal drainage, non tender sinus.  NECK :  without JVD/Nodes/TM/ nl carotid upstrokes bilaterally LUNGS coarse BS w/ faint exp wheeze on forced expiration.  CV:  RRR  no s3 or murmur or increase in P2, no edema  ABD:  soft and nontender with nl excursion in the supine position. No bruits or organomegaly, bowel sounds nl, no guarding.  MS:  warm without deformities, calf tenderness, cyanosis or clubbing      Impression & Recommendations:  Problem # 1:  BRONCHITIS, ACUTE (ICD-466.0) Slow to resolve bronchitis w/ associated viral gastroenteritis w/ underlying sarcoid (?flare) Plan:  Levaquin 500mg  once daily for 7 days Prednisone taper over next week.  Mucinex DM two times a day as needed cough  Add pepcid 20mg  at bedtime  INcrease fluids. and rest.  small meals, advance bland diet as tolerated.  Phenergan as needed nausea  Tramadol 50mg  every 4 hr as needed cough Please contact office for sooner follow up if symptoms do not improve or worsen   The following medications were removed from the medication list:    Zithromax Z-pak 250 Mg Tabs (Azithromycin) .Marland Kitchen... Take as  directed Her updated medication list for this problem includes:    Robitussin Dm 100-10 Mg/28ml Syrp (Dextromethorphan-guaifenesin) .Marland Kitchen... As directed as needed    Levaquin 500 Mg Tabs (Levofloxacin) .Marland Kitchen... 1 by mouth once daily  Orders: Est. Patient Level IV ([address])  Medications Added to Medication List This Visit: 1)  Levaquin 500 Mg Tabs (Levofloxacin) .Marland Kitchen.. 1 by mouth once daily 2)  Prednisone 10 Mg Tabs (Prednisone) .... 4 tabs for 2 days, then 3 tabs for 2 days, 2 tabs for 2 days, then 1 tab for 2 days, then stop  Complete Medication List: 1)  Nexium 40 Mg Cpdr (Esomeprazole magnesium) .Marland Kitchen.. 1 by mouth once daily 2)  Robitussin Dm 100-10 Mg/89ml Syrp (Dextromethorphan-guaifenesin) .Marland KitchenMarland KitchenMarland Kitchen  As directed as needed 3)  Tramadol Hcl 50 Mg Tabs (Tramadol hcl) .... One to two by mouth every 4-6 hours as needed for cough or pain 4)  Xanax 0.5 Mg Tabs (Alprazolam) .... 1/2 by mouth every 6 hours during the day as needed  for anxiety and 1 by mouth at bedtime for sleep if needed 5)  Levaquin 500 Mg Tabs (Levofloxacin) .Marland Kitchen.. 1 by mouth once daily 6)  Prednisone 10 Mg Tabs (Prednisone) .... 4 tabs for 2 days, then 3 tabs for 2 days, 2 tabs for 2 days, then 1 tab for 2 days, then stop  Other Orders: T-Urine Culture (Spectrum Order) ([id number]) Nebulizer Tx (09811) Admin of Therapeutic Inj  intramuscular or subcutaneous (91478) Depo- Medrol 80mg  (J1040) Depo- Medrol 40mg  (J1030) TLB-Udip w/ Micro (81001-URINE)  Patient Instructions: 1)  Levaquin 500mg  once daily for 7 days 2)  Prednisone taper over next week.  3)  Mucinex DM two times a day as needed cough  4)  Add pepcid 20mg  at bedtime  5)  INcrease fluids. and rest.  6)  small meals, advance bland diet as tolerated.  7)  Phenergan as needed nausea  8)  Tramadol 50mg  every 4 hr as needed cough 9)  Please contact office for sooner follow up if symptoms do not improve or worsen  Prescriptions: PREDNISONE 10 MG TABS (PREDNISONE) 4 tabs for 2  days, then 3 tabs for 2 days, 2 tabs for 2 days, then 1 tab for 2 days, then stop  #20 x 0   Entered and Authorized by:   Rubye Oaks NP   Signed by:   Rubye Oaks NP on 05/29/2010   Method used:   Electronically to        [facility]* (retail)       [address]       [address] Shipping/mailing       [place], [place]  [address]       Ph: [phone]       Fax: [phone]   RxID:   [id number] LEVAQUIN 500 MG TABS (LEVOFLOXACIN) 1 by mouth once daily  #7 x 0   Entered and Authorized by:   Rubye Oaks NP   Signed by:   Rubye Oaks NP on 05/29/2010   Method used:   Electronically to        [facility]* (retail)       [address]       [address] Shipping/mailing       [place], [place]  [address]       Ph: [phone]       Fax: [phone]   RxID:   [id number]    Medication Administration  Injection # 1:    Medication: Depo- Medrol 40mg     Diagnosis: URI, ACUTE (ICD-465.9)    Route: IM    Site: LUOQ gluteus    Exp Date: 11-2012    Lot #: [id number]    Mfr: Pharmacia    Patient tolerated injection without complications    Given by: Boone Master CNA/MA (May 29, 2010 5:23 PM)  Injection # 2:    Medication: Depo- Medrol 80mg     Diagnosis: URI, ACUTE (ICD-465.9)    Route: IM    Site: LUOQ gluteus    Exp Date: 11-2012    Lot #: [id number]    Mfr: Pharmacia    Patient tolerated injection without complications    Given by: Shanda Bumps  Jones CNA/MA (May 29, 2010 5:23 PM)  Orders Added: 1)  T-Urine Culture (Spectrum Order) [id number] 2)  Nebulizer Tx [phone] 3)  Admin of Therapeutic Inj  intramuscular or subcutaneous [96372] 4)  Depo- Medrol 80mg  [J1040] 5)  Depo- Medrol 40mg  [J1030] 6)  TLB-Udip w/ Micro [81001-URINE] 7)  Est. Patient Level IV [51761]

## 2010-09-26 NOTE — Progress Notes (Signed)
Summary: Ok to rx xanax 0.5 #30  Phone Note Call from Patient   Caller: Patient Call For: Reyah Streeter Summary of Call: pt states she is having anxiety (because her brother is having brain surgery this week). requests rx for this or to be seen today. pt ph# [phone] ([facility]). [facility] Initial call taken by: Tivis Ringer, CNA,  April 26, 2010 12:59 PM  Follow-up for Phone Call        called spoke with patient who states her brother will be having brain surgery on friday and has been having anxiety, frequent crying and difficulty sleeping.  pt states she does not have a PCP.  MW, is it okay for patient to have rx for anxiety? Boone Master CNA/MA  April 26, 2010 2:44 PM   ALLERGIES: codeine rx xanax 0.5 one half every 6 hours as needed daytime  for anxiety and one at hs as needed insominia #30 no refills Follow-up by: Nyoka Cowden MD,  April 26, 2010 5:15 PM  Additional Follow-up for Phone Call Additional follow up Details #1::        Pharmacy is closed and [facility] to let pt know we will call this in for her in the morning.Michel Bickers [facility]  April 26, 2010 5:22 PM  rx called into [facility] pharmacy.pt aware.Carron Curie CMA  April 27, 2010 9:28 AM     New/Updated Medications: XANAX 0.5 MG TABS (ALPRAZOLAM) 1/2 by mouth every 6 hours during the day as needed  for anxiety and 1 by mouth at bedtime for sleep if needed Prescriptions: XANAX 0.5 MG TABS (ALPRAZOLAM) 1/2 by mouth every 6 hours during the day as needed  for anxiety and 1 by mouth at bedtime for sleep if needed  #30 x 0   Entered by:   Michel Bickers CMA   Authorized by:   Nyoka Cowden MD   Signed by:   Michel Bickers CMA on 04/26/2010   Method used:   Historical   RxID:   [id number]

## 2010-09-26 NOTE — Letter (Signed)
Summary: Out of Work  [employer]  [address]   [place], [place] [address]   Phone: [phone]  Fax: [phone]    May 29, 2010   Employee:  Kathleen [name]    To Whom It May Concern:   For Medical reasons, please excuse the above named employee from work for the following dates:  Start:   Monday May 29, 2010  End:   Sunday June 04, 2010.    Patient may return to work earlier than the above date if her symptoms have resolved.  If you need additional information, please feel free to contact our office.         Sincerely,        Giah Arbutus Nelligan, N.P.

## 2010-09-26 NOTE — Progress Notes (Signed)
Summary: rx or samples  Phone Note Call from Patient Call back at Work Phone [phone]   Caller: Patient Call For: wert Reason for Call: Talk to Nurse Summary of Call: pt out of her Nexium - Needs this called in - she has been taking them as she needed them.  Out before rx is due for refill.  If you can't call in can she get a sample? [facility] Initial call taken by: Eugene Gavia,  February 13, 2010 10:48 AM  Follow-up for Phone Call        Pt advised when she was sick and came in to see MW she was taking Nexium 40mg  three times a day, and usually takes same two times a day. Pt called pharmacy and was advised to call us. I called pharmacy and LMOMTCB. Rx was filled 01-10-10, need to know why pt can not get same filled. Zackery Barefoot CMA  February 13, 2010 12:08 PM   Additional Follow-up for Phone Call Additional follow up Details #1::        Spoke with pt.  She ran out of her nexium because she was taking med two times a day as per MW instructions from last ov.  I telephoned a refill in to [facility].  Pt aware. Additional Follow-up by: Vernie Murders,  February 13, 2010 2:41 PM    Prescriptions: NEXIUM 40 MG CPDR (ESOMEPRAZOLE MAGNESIUM) 1 by mouth once daily  #30 x 5   Entered by:   Vernie Murders   Authorized by:   Nyoka Cowden MD   Signed by:   Vernie Murders on 02/13/2010   Method used:   Telephoned to ...       [facility]* (retail)       [address]       [address] Shipping/mailing       [place], [place]  [address]       Ph: [phone]       Fax: [phone]   RxID:   [id number]

## 2010-09-26 NOTE — Assessment & Plan Note (Signed)
Summary: Pulmonary/ acute ov for recurrent severe cough   Primary Provider/Referring Provider:  [facility]  CC:  Acute visit.  Pt c/o head and chest congestion x 2 days with non prod.  She also has had some wheezing when she lies down and increased SOB with exertion.Kathleen [name]  History of Present Illness: 50  yowf never smoker who has a history of sarcoid w/ positive biopsy for noncaseating granulomatous inflammation in 1994. Prev. on chronic steroids off since around 2005  w. follow w/ no active sarcoid. Has hx of atypical chest pain evaluated in November 12, 2005 and felt to be irritable bowel syndrome and GERD.      June 20, 2009 Acute visit.  Pt c/o cough x several days.  She states that cough is non prod, but she coughs to the point of vomiting.  Also c/o muscle aches.  Took flu shot at work > 1 week prior to onset.  rec rx as uri/ viral and suppress acid reflux.  Jan 10, 2010 Acute visit.  Pt c/o head and chest congestion x 2 days with non prod.  She also has had some wheezing when she lies down and increased SOB with exertion. cough is dry to point of vomiting -  did not activate plan for gerd rx as rec and rob dm no t helping.  no purulent sputum, HA, scratchy throat. Pt denies any significant  dysphagia, itching, sneezing,  nasal congestion or excess secretions,  fever, chills, sweats, unintended wt loss, pleuritic or exertional cp, hempoptysis, change in activity tolerance  orthopnea pnd or leg swelling.  Current Medications (verified): 1)  Nexium 40 Mg Cpdr (Esomeprazole Magnesium) .Kathleen [name].. 1 By Mouth Once Daily 2)  Robitussin Dm 100-10 Mg/48ml Syrp (Dextromethorphan-Guaifenesin) .... As Directed As Needed  Allergies (verified): 1)  ! Codeine  Past History:  Past Medical History: Sarcoidosis    -positive biopsy for NCG   1994. Prev. on steroids x several years only Breast fibrocystic-prev. bx  Vital Signs:  Patient profile:   50 year old female Weight:      187 pounds BMI:      26.93 O2 Sat:      97 % on Room air Temp:     97.7 degrees F oral Pulse rate:   65 / minute BP sitting:   98 / 60  (left arm)  Vitals Entered By: Vernie Murders (Jan 10, 2010 12:06 PM)  O2 Flow:  Room air  Physical Exam  Additional Exam:  amb wf nad  wt   188 June 20, 2009 > 187 Jan 10, 2010  HEENT: nl dentition, turbinates, and orophanx. Nl external ear canals without cough reflex NECK :  without JVD/Nodes/TM/ nl carotid upstrokes bilaterally LUNGS: no acc muscle use, clear to A and P bilaterally without cough on insp or exp maneuvers CV:  RRR  no s3 or murmur or increase in P2, no edema  ABD:  soft and nontender with nl excursion in the supine position. No bruits or organomegaly, bowel sounds nl MS:  warm without deformities, calf tenderness, cyanosis or clubbing      CXR  Procedure date:  01/10/2010  Findings:      1.  Stable appearance of prominent nodularity in both lungs with the interstitial accentuation compatible with prominent pulmonary sarcoidosis.  There is persistent confluent airspace opacity in the paramediastinal region, likely than left, and also stable.  Impression & Recommendations:  Problem # 1:  URI, ACUTE (ICD-465.9)  Her updated medication list for this  problem includes:    Robitussin Dm 100-10 Mg/84ml Syrp (Dextromethorphan-guaifenesin) .Kathleen [name]... As directed as needed  Explained natural h/o uri and why it's necessary in patients at risk to rx short term with PPI to reduce risk of evolving cyclical cough triggered by epithelial injury and a heightened sensitivty to the effects of any upper airway irritants,  most importantly acid - related    Orders: Est. Patient Level IV ([address])  Problem # 2:  SARCOIDOSIS (ICD-135) No sign progression radiographically nor clinically to support active dz.   Each maintenance medication was reviewed in detail including most importantly the difference between maintenance and as needed and under what circumstances  the prns are to be used. See instructions for specific recommendations   Medications Added to Medication List This Visit: 1)  Nexium 40 Mg Cpdr (Esomeprazole magnesium) .Kathleen [name].. 1 by mouth once daily 2)  Tramadol Hcl 50 Mg Tabs (Tramadol hcl) .... One to two by mouth every 4-6 hours as needed for cough or pain 3)  Pepcid Ac Maximum Strength 20 Mg Tabs (Famotidine) .... One at bedtime whenever having any respiratory symptoms  Other Orders: T-2 View CXR (71020TC)  Patient Instructions: 1)  Take robitussin dm 2 tsp every 4 hours  and add tramadol 50 mg up to 2 every 4 hours to suppress the urge to cough. Swallowing water or using ice chips/non mint and menthol containing candies (such as lifesavers or sugarless jolly ranchers) are also effective. 2)  GERD (gastroesophageal reflux disease) was discussed   3)  whenever you have either chest pain or any respiratory complaints restart nexium Take one 30-60 min before first and last meals of the day until better x 2 weeks. 4)  GERD (REFLUX)  is a common cause of respiratory symptoms. It commonly presents without heartburn and can be treated with medication, but also with lifestyle changes including avoidance of late meals, excessive alcohol, smoking cessation, and avoid fatty foods, chocolate, peppermint, colas, red wine, and acidic juices such as orange juice. NO MINT OR MENTHOL PRODUCTS SO NO COUGH DROPS  5)  USE SUGARLESS CANDY INSTEAD (jolley ranchers)  6)  NO OIL BASED VITAMINS  Prescriptions: NEXIUM 40 MG CPDR (ESOMEPRAZOLE MAGNESIUM) 1 by mouth once daily  #34 x 3   Entered and Authorized by:   Nyoka Cowden MD   Signed by:   Nyoka Cowden MD on 01/10/2010   Method used:   Electronically to        [facility]* (retail)       [address] [address]       [address] Shipping/mailing       [place], [place]  [address]       Ph: [phone]       Fax: [phone]   RxID:   [id number] PEPCID AC MAXIMUM STRENGTH 20 MG TABS  (FAMOTIDINE) one at bedtime whenever having any respiratory symptoms  #30 x 11   Entered and Authorized by:   Nyoka Cowden MD   Signed by:   Nyoka Cowden MD on 01/10/2010   Method used:   Electronically to        [facility]* (retail)       [address]       [address] Shipping/mailing       [place], [place]  [address]       Ph: [phone]       Fax: [phone]   RxID:  [id number] TRAMADOL HCL 50 MG  TABS (TRAMADOL HCL) One to two by mouth every 4-6 hours as needed for cough or pain  #40 x 0   Entered and Authorized by:   Nyoka Cowden MD   Signed by:   Nyoka Cowden MD on 01/10/2010   Method used:   Electronically to        [facility]* (retail)       [address]       [address] Shipping/mailing       [place], [place]  [address]       Ph: [phone]       Fax: [phone]   RxID:   [id number]    CXR  Procedure date:  01/10/2010  Findings:      1.  Stable appearance of prominent nodularity in both lungs with the interstitial accentuation compatible with prominent pulmonary sarcoidosis.  There is persistent confluent airspace opacity in the paramediastinal region, likely than left, and also stable.

## 2010-09-26 NOTE — Assessment & Plan Note (Signed)
Summary: sob/coughing and congested/ mbw   Primary Provider/Referring Provider:  [facility]  CC:  OV - SInus and chest congestion for 3 days - Sorethroat - Non prod cough leads to vomiting - Body aches - Hoarseness - Out of Tramadol.  History of Present Illness: 76  yowf never smoker who has a history of sarcoid w/ positive biopsy for noncaseating granulomatous inflammation in 1994. Prev. on chronic steroids off since around 2005  w. follow w/ no active sarcoid. Has hx of atypical chest pain evaluated in November 12, 2005 and felt to be irritable bowel syndrome and GERD.     June 20, 2009 Acute visit.  Pt c/o cough x several days.  She states that cough is non prod, but she coughs to the point of vomiting.  Also c/o muscle aches.  Took flu shot at work > 1 week prior to onset.  rec rx as uri/ viral and suppress acid reflux.  Jan 10, 2010 Acute visit.  Pt c/o head and chest congestion x 2 days with non prod.  She also has had some wheezing when she lies down and increased SOB with exertion. cough is dry to point of vomiting -  did not activate plan for gerd rx as rec and rob dm no t helping.  no purulent sputum, HA, scratchy throat.   May 23, 2010 --Presents for an acute office visit. Complains of sinus  and chest congestion for 3 days. Also has sore throat and non prod cough. Coughs so hard she near vomits. - Body aches -and hoarseness for last 3 days. She is out of  Tramadol. Blowing out green /yellow mucus. OTC not helping. Denies chest pain,  orthopnea, hemoptysis, fever, n/v/d, edema, headache.  Current Medications (verified): 1)  Nexium 40 Mg Cpdr (Esomeprazole Magnesium) .Marland Kitchen.. 1 By Mouth Once Daily 2)  Robitussin Dm 100-10 Mg/14ml Syrp (Dextromethorphan-Guaifenesin) .... As Directed As Needed 3)  Tramadol Hcl 50 Mg  Tabs (Tramadol Hcl) .... One To Two By Mouth Every 4-6 Hours As Needed For Cough or Pain 4)  Xanax 0.5 Mg Tabs (Alprazolam) .... 1/2 By Mouth Every 6 Hours During  The Day As Needed  For Anxiety and 1 By Mouth At Bedtime For Sleep If Needed  Allergies (verified): 1)  ! Codeine  Comments:  Nurse/Medical Assistant: The patient's medications and allergies were reviewed with the patient and were updated in the Medication and Allergy Lists.  Past History:  Past Medical History: Last updated: 01/10/2010 Sarcoidosis    -positive biopsy for NCG   1994. Prev. on steroids x several years only Breast fibrocystic-prev. bx  Review of Systems      See HPI  Vital Signs:  Patient profile:   50 year old female Height:      70 inches Weight:      185.50 pounds O2 Sat:      95 % on Room air Temp:     97.6 degrees F oral Pulse rate:   76 / minute BP sitting:   124 / 66  (left arm) Cuff size:   regular  Vitals Entered By: Boone Master CNA/MA (May 23, 2010 11:40 AM)  O2 Flow:  Room air  Physical Exam  Additional Exam:  amb wf nad  wt   188 June 20, 2009 > 187 Jan 10, 2010 >>185 05/23/10 HEENT: nl dentition,  , and orophanx. Nl external ear canals without cough reflex, clear nasal drainage, non tender sinus.  NECK :  without JVD/Nodes/TM/ nl carotid upstrokes  bilaterally LUNGS: no acc muscle use, clear to A and P bilaterally without cough on insp or exp maneuvers CV:  RRR  no s3 or murmur or increase in P2, no edema  ABD:  soft and nontender with nl excursion in the supine position. No bruits or organomegaly, bowel sounds nl MS:  warm without deformities, calf tenderness, cyanosis or clubbing      Impression & Recommendations:  Problem # 1:  URI, ACUTE (ICD-465.9)  Mucinex DM two times a day as needed cough/congestion Zyrtec 10mg  at bedtime as needed drainge.  Saline nasal rinses.  INcrease fluids. and rest.  Zpack take as directed  Tramadol 50mg  every 4 hr as needed cough Restart Nexium 40mg  once daily before meal .  Please contact office for sooner follow up if symptoms do not improve or worsen  Her updated medication list for  this problem includes:    Robitussin Dm 100-10 Mg/33ml Syrp (Dextromethorphan-guaifenesin) .Marland Kitchen... As directed as needed  Orders: Est. Patient Level III ([address])  Medications Added to Medication List This Visit: 1)  Zithromax Z-pak 250 Mg Tabs (Azithromycin) .... Take as directed  Complete Medication List: 1)  Nexium 40 Mg Cpdr (Esomeprazole magnesium) .Marland Kitchen.. 1 by mouth once daily 2)  Robitussin Dm 100-10 Mg/55ml Syrp (Dextromethorphan-guaifenesin) .... As directed as needed 3)  Tramadol Hcl 50 Mg Tabs (Tramadol hcl) .... One to two by mouth every 4-6 hours as needed for cough or pain 4)  Xanax 0.5 Mg Tabs (Alprazolam) .... 1/2 by mouth every 6 hours during the day as needed  for anxiety and 1 by mouth at bedtime for sleep if needed 5)  Zithromax Z-pak 250 Mg Tabs (Azithromycin) .... Take as directed  Patient Instructions: 1)  Mucinex DM two times a day as needed cough/congestion 2)  Zyrtec 10mg  at bedtime as needed drainge.  3)  Saline nasal rinses.  4)  INcrease fluids. and rest.  5)  Zpack take as directed  6)  Tramadol 50mg  every 4 hr as needed cough 7)  Restart Nexium 40mg  once daily before meal .  8)  Please contact office for sooner follow up if symptoms do not improve or worsen  Prescriptions: ZITHROMAX Z-PAK 250 MG TABS (AZITHROMYCIN) take as directed  #1 x 0   Entered and Authorized by:   Rubye Oaks NP   Signed by:   Rubye Oaks NP on 05/23/2010   Method used:   Electronically to        [facility]* (retail)       [address]       [address] Shipping/mailing       [place], [place]  [address]       Ph: [phone]       Fax: [phone]   RxID:   [id number] TRAMADOL HCL 50 MG  TABS (TRAMADOL HCL) One to two by mouth every 4-6 hours as needed for cough or pain  #40 x 0   Entered and Authorized by:   Rubye Oaks NP   Signed by:   Rubye Oaks NP on 05/23/2010   Method used:   Electronically to        [facility]*  (retail)       [address]       [address] [place] [address]mailing       [place], [place]  [address]       Ph: [phone]       Fax: [phone]  RxID:   [id number]

## 2010-09-26 NOTE — Letter (Signed)
Summary: [employer]  [address]   [place], [place] [address]   Phone: [phone]  Fax: [phone]    Today's Date: May 23, 2010  Name of Patient: Kathleen [name]  The above named patient had a medical visit today at:  11:45 am / pm.  Please take this into consideration when reviewing the time away from work/school.    Special Instructions: Pt can return to work Friday May 26, 2010  [ X ] None  [  ] To be off the remainder of today, returning to the normal work / school schedule tomorrow.  [  ] To be off until the next scheduled appointment on ______________________.  [  ] Other ________________________________________________________________ ________________________________________________________________________   Sincerely yours,      Rubye Oaks, NP

## 2010-09-28 ENCOUNTER — Ambulatory Visit (INDEPENDENT_AMBULATORY_CARE_PROVIDER_SITE_OTHER): Payer: Commercial Managed Care - PPO | Admitting: Cardiovascular Disease

## 2010-09-28 DIAGNOSIS — R079 Chest pain, unspecified: Secondary | ICD-10-CM

## 2010-10-17 ENCOUNTER — Ambulatory Visit (INDEPENDENT_AMBULATORY_CARE_PROVIDER_SITE_OTHER): Payer: Commercial Managed Care - PPO | Admitting: Adult Health

## 2010-10-17 ENCOUNTER — Encounter: Payer: Self-pay | Admitting: Adult Health

## 2010-10-17 DIAGNOSIS — F411 Generalized anxiety disorder: Secondary | ICD-10-CM

## 2010-10-17 DIAGNOSIS — D869 Sarcoidosis, unspecified: Secondary | ICD-10-CM

## 2010-10-19 DIAGNOSIS — F411 Generalized anxiety disorder: Secondary | ICD-10-CM | POA: Insufficient documentation

## 2010-10-23 ENCOUNTER — Telehealth (INDEPENDENT_AMBULATORY_CARE_PROVIDER_SITE_OTHER): Payer: Self-pay | Admitting: *Deleted

## 2010-10-24 NOTE — Letter (Signed)
Summary: [employer]  [address]   [place], [place] [address]   Phone: [phone]  Fax: [phone]    Today's Date: October 17, 2010  Name of Patient: Kathleen [name]  The above named patient had a medical visit today at:  9:00 am.  Please take this into consideration when reviewing the time away from work.    Special Instructions:  [ X ] None  [  ] To be off the remainder of today, returning to the normal work / school schedule tomorrow.  [  ] To be off until the next scheduled appointment on ______________________.  [  ] Other ________________________________________________________________ ________________________________________________________________________   Sincerely yours,      Eliza Parrett, N.P.

## 2010-10-24 NOTE — Assessment & Plan Note (Signed)
Summary: Acute NP office visit - sarcoid   Primary Provider/Referring Provider:  [facility]  CC:  increased SOB, wheezing esp at night, reports panic attacks d/t difficulty breathing, dry cough, and chills x1week.  History of Present Illness: 50  yowf never smoker who has a history of sarcoid w/ positive biopsy for noncaseating granulomatous inflammation in 1994. Prev. on chronic steroids off since around 2005  w. follow w/ no active sarcoid. Has hx of atypical chest pain evaluated in November 12, 2005 and felt to be irritable bowel syndrome and GERD.     June 20, 2009 Acute visit.  Pt c/o cough x several days.  She states that cough is non prod, but she coughs to the point of vomiting.  Also c/o muscle aches.  Took flu shot at work > 1 week prior to onset.  rec rx as uri/ viral and suppress acid reflux.  Jan 10, 2010 Acute visit.  Pt c/o head and chest congestion x 2 days with non prod.  She also has had some wheezing when she lies down and increased SOB with exertion. cough is dry to point of vomiting -  did not activate plan for gerd rx as rec and rob dm no t helping.  no purulent sputum, HA, scratchy throat.   May 23, 2010 --Presents for an acute office visit. Complains of sinus  and chest congestion for 3 days. Also has sore throat and non prod cough. Coughs so hard she near vomits. - Body aches -and hoarseness for last 3 days. She is out of  Tramadol. Blowing out green /yellow mucus. OTC not helping. Dx URI-tx w/ zpack.   May 29, 2010 --Presents for work in visit, Complains of persistent symptoms. Seen last week with bronchitic complaints tx w/ zpack. Has had no improvment in cough or congestion. Developed n/v on 05/27/10 w/ subsequent ER visit. Tx w/ IVF and phenergan. XRay with no acute changes. labs showed ? positive udip. repeated today w/ some bacteria. labs were unrevealing. She has not filled her  filled phenergan. No further vomitting but no appetitie and lots of  nausea. Still has very deep cough. with   green/yellow mucus, hoarseness - states tramadol hasn't helped, finished zpak yesterday. Denies chest pain, orthopnea, hemoptysis, fever,  diarrhea  edema, headache.  October 17, 2010--Presents for an acute office visit. Complains that she is under alot of stress , esp at work is having alot of anxiety and panic attacks.She is very tearful during visit. No suicidal ideations. Has noted increased SOB, wheezing esp at night,   dry cough, chills x1week.No discolored mucus. She does not have a PCP. We discussed referral to discuss her anxiety issues. Says she always feels she will be in trouble at work, about losing job because she gets sick alot.  Denies chest pain,  orthopnea, hemoptysis, fever, n/v/d, edema, headache,exertional chest pain, syncope.  Medications Prior to Update: 1)  Nexium 40 Mg Cpdr (Esomeprazole Magnesium) .Marland Kitchen.. 1 By Mouth Once Daily 2)  Robitussin Dm 100-10 Mg/73ml Syrp (Dextromethorphan-Guaifenesin) .... As Directed As Needed 3)  Tramadol Hcl 50 Mg  Tabs (Tramadol Hcl) .... One To Two By Mouth Every 4-6 Hours As Needed For Cough or Pain 4)  Xanax 0.5 Mg Tabs (Alprazolam) .... 1/2 By Mouth Every 6 Hours During The Day As Needed  For Anxiety and 1 By Mouth At Bedtime For Sleep If Needed 5)  Levaquin 500 Mg Tabs (Levofloxacin) .Marland Kitchen.. 1 By Mouth Once Daily 6)  Prednisone 10 Mg  Tabs (Prednisone) .... 4 Tabs For 2 Days, Then 3 Tabs For 2 Days, 2 Tabs For 2 Days, Then 1 Tab For 2 Days, Then Stop  Current Medications (verified): 1)  Nexium 40 Mg Cpdr (Esomeprazole Magnesium) .Marland Kitchen.. 1 By Mouth Once Daily 2)  Robitussin Dm 100-10 Mg/26ml Syrp (Dextromethorphan-Guaifenesin) .... As Directed As Needed 3)  Tramadol Hcl 50 Mg  Tabs (Tramadol Hcl) .... One To Two By Mouth Every 4-6 Hours As Needed For Cough or Pain 4)  Xanax 0.5 Mg Tabs (Alprazolam) .... 1/2 By Mouth Every 6 Hours During The Day As Needed  For Anxiety and 1 By Mouth At Bedtime For Sleep If  Needed 5)  Propranolol Hcl 10 Mg Tabs (Propranolol Hcl) .... Take 1 Tablet By Mouth Four Times A Day As Needed Chest Pain  Allergies (verified): 1)  ! Codeine  Past History:  Past Medical History: Last updated: 01/10/2010 Sarcoidosis    -positive biopsy for NCG   1994. Prev. on steroids x several years only Breast fibrocystic-prev. 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 No Comments Medications reviewed with patient Daytime contact number verified with patient. Boone Master CNA/MA  October 17, 2010 9:07 AM    Physical Exam  Additional Exam:  amb wf nad  wt   188 June 20, 2009 > 187 Jan 10, 2010 >>185 05/23/10>>184 10/4 >193 2/21 HEENT: nl dentition,  , and orophanx. Nl external ear canals without cough reflex, clear nasal drainage, non tender sinus.  NECK :  without JVD/Nodes/TM/ nl carotid upstrokes bilaterally LUNGS coarse BS w/ no wheezing  CV:  RRR  no s3 or murmur or increase in P2, no edema  ABD:  soft and nontender with nl excursion in the supine position. No bruits or organomegaly, bowel sounds nl, no guarding.  MS:  warm  without deformities, calf tenderness, cyanosis or clubbing Pschy: tearful, anxious      Impression & Recommendations:  Problem # 1:  SARCOIDOSIS (ICD-135)  Flare  Plan:  steroid taper  Prednisone taper as directed.   follow up Dr. Sherene Sires in 4 weeks and as needed Please contact office for sooner follow up if symptoms do not improve or worsen  The following medications were removed from the medication list:    Levaquin 500 Mg Tabs (Levofloxacin) .Marland Kitchen... 1 by mouth once daily Her updated medication list for this problem includes:    Robitussin Dm 100-10 Mg/107ml Syrp (Dextromethorphan-guaifenesin) .Marland Kitchen... As directed as needed  Orders: Internal Medicine Referral (Internal) Est. Patient Level IV (  11914)  Problem # 2:  ANXIETY (ICD-300.00)  advised on stress reducers   Xanax as needed anxiety We are setting you up with primary care doctor .  Please contact office for sooner follow up if symptoms do not improve or worsen  Her updated medication list for this problem includes:    Xanax 0.5 Mg Tabs (Alprazolam) .Marland Kitchen... 1/2-1 by mouth three times a day as needed anxiety  Orders: Est. Patient Level IV (78295)  Medications Added to Medication List This Visit: 1)  Xanax 0.5 Mg Tabs (Alprazolam) .... 1/2-1 by mouth three times a day as needed anxiety 2)  Propranolol Hcl 10 Mg Tabs (Propranolol hcl) .... Take 1 tablet by mouth four times a day as needed chest pain 3)  Prednisone 10 Mg Tabs (Prednisone) .... 2 by mouth once daily x 1 week then 1 by mouth once daily x 1 week then 1/2 once daily x 1 week and stop  Patient Instructions: 1)  Prednisone taper as directed.  2)  Xanax as needed anxiety 3)  We are setting you up with primary care doctor .  4)  Please contact office for sooner follow up if symptoms do not improve or worsen  Prescriptions: XANAX 0.5 MG TABS (ALPRAZOLAM) 1/2-1 by mouth three times a day as needed anxiety  #45 x 0   Entered and Authorized by:   Rubye Oaks NP   Signed  by:   Brylee Kjuan Seipp NP on 10/17/2010   Method used:   Print then Give to Patient   RxID:   [id number] PREDNISONE 10 MG TABS (PREDNISONE) 2 by mouth once daily x 1 week then 1 by mouth once daily x 1 week then 1/2 once daily x 1 week and stop  #28 x 0   Entered and Authorized by:   Rubye Oaks NP   Signed by:   Rubye Oaks NP on 10/17/2010   Method used:   Electronically to        [facility]* (retail)       [address]       [address] [place] [address]       [place], [place]  [address]       Ph: [phone]       Fax: [phone]   RxID:   [id number]    Immunization History:  Influenza Immunization History:    Influenza:  historical (06/27/2010)

## 2010-10-24 NOTE — Letter (Signed)
Summary: Out of Work  [facility]  [address]   [place], [place] [address]   Phone: [phone]  Fax: [phone]    October 17, 2010   Employee:  ALETHEIA TANGREDI    To Whom It May Concern:   For Medical reasons, please excuse the above named employee from work for the following dates:  Start:   Wednesday October 18, 2010 - 1/2 day  End:   Thursday October 19, 2010  If you need additional information, please feel free to contact our office.         Sincerely,       Canda Lane Eland, N.P.

## 2010-10-31 ENCOUNTER — Other Ambulatory Visit: Payer: Self-pay | Admitting: Obstetrics and Gynecology

## 2010-10-31 DIAGNOSIS — N63 Unspecified lump in unspecified breast: Secondary | ICD-10-CM

## 2010-11-01 ENCOUNTER — Ambulatory Visit: Payer: Commercial Managed Care - PPO | Admitting: Internal Medicine

## 2010-11-02 NOTE — Progress Notes (Signed)
Summary: 4 week follow up w/ MW > 3.26.12   Phone Note Outgoing Call Call back at Work Phone [phone]   Call placed by: Boone Master CNA/MA,  October 23, 2010 3:41 PM Call placed to: Patient Summary of Call: ---- Converted from flag ---- ---- 10/19/2010 1:25 PM, Rubye Oaks NP wrote: needs ov with Dr. Sherene Sires in 4 weeks .for follow up ------------------------------  ATC pt at home # to schedule appt w/ TP.  was told VM is not set up yet.  called spoke with patient at work number.  appt sched w/ MW 3.26.12 @ 0915.  pt okay with this date and time. Boone Master CNA/MA  October 23, 2010 3:38 PM

## 2010-11-03 ENCOUNTER — Ambulatory Visit
Admission: RE | Admit: 2010-11-03 | Discharge: 2010-11-03 | Disposition: A | Discharge status: Home / Self Care | Payer: 59 | Source: Ambulatory Visit | Attending: Obstetrics and Gynecology | Admitting: Obstetrics and Gynecology

## 2010-11-03 ENCOUNTER — Ambulatory Visit
Admission: RE | Admit: 2010-11-03 | Discharge: 2010-11-03 | Disposition: A | Discharge status: Home / Self Care | Payer: Commercial Managed Care - PPO | Source: Ambulatory Visit | Attending: Obstetrics and Gynecology | Admitting: Obstetrics and Gynecology

## 2010-11-03 ENCOUNTER — Other Ambulatory Visit: Payer: Self-pay | Admitting: Obstetrics and Gynecology

## 2010-11-03 DIAGNOSIS — N63 Unspecified lump in unspecified breast: Secondary | ICD-10-CM

## 2010-11-08 LAB — BASIC METABOLIC PANEL
BUN: 6 mg/dL (ref 6–23)
CO2: 25 mEq/L (ref 19–32)
Calcium: 8.8 mg/dL (ref 8.4–10.5)
Chloride: 106 mEq/L (ref 96–112)
Creatinine, Ser: 0.71 mg/dL (ref 0.4–1.2)
GFR calc Af Amer: >60 mL/min (ref >60)
GFR calc non Af Amer: >60 mL/min (ref >60)
Glucose, Bld: 98 mg/dL (ref 70–99)
Potassium: 3.5 mEq/L (ref 3.5–5.1)
Sodium: 136 mEq/L (ref 135–145)

## 2010-11-08 LAB — CBC
HCT: 33.2 % — ABNORMAL LOW (ref 36.0–46.0)
Hemoglobin: 11.6 g/dL — ABNORMAL LOW (ref 12.0–15.0)
MCH: 29.5 pg (ref 26.0–34.0)
MCHC: 35.1 g/dL (ref 30.0–36.0)
MCV: 84.2 fL (ref 78.0–100.0)
Platelets: 295 10*3/uL (ref 150–400)
RBC: 3.94 MIL/uL (ref 3.87–5.11)
RDW: 14.3 % (ref 11.5–15.5)
WBC: 5.5 10*3/uL (ref 4.0–10.5)

## 2010-11-08 LAB — DIFFERENTIAL
Basophils Absolute: 0 10*3/uL (ref 0.0–0.1)
Basophils Relative: 1 % (ref 0–1)
Eosinophils Absolute: 0.2 10*3/uL (ref 0.0–0.7)
Eosinophils Relative: 3 % (ref 0–5)
Lymphocytes Relative: 20 % (ref 12–46)
Lymphs Abs: 1.1 10*3/uL (ref 0.7–4.0)
Monocytes Absolute: 0.5 10*3/uL (ref 0.1–1.0)
Monocytes Relative: 10 % (ref 3–12)
Neutro Abs: 3.7 10*3/uL (ref 1.7–7.7)
Neutrophils Relative %: 67 % (ref 43–77)

## 2010-11-08 LAB — URINE MICROSCOPIC-ADD ON

## 2010-11-08 LAB — URINALYSIS, ROUTINE W REFLEX MICROSCOPIC
Bilirubin Urine: NEGATIVE
Glucose, UA: NEGATIVE mg/dL
Ketones, ur: NEGATIVE mg/dL
Leukocytes, UA: NEGATIVE
Nitrite: POSITIVE — AB
Protein, ur: NEGATIVE mg/dL
Specific Gravity, Urine: 1.025 (ref 1.005–1.030)
Urobilinogen, UA: 0.2 mg/dL (ref 0.0–1.0)
pH: 6 (ref 5.0–8.0)

## 2010-11-10 ENCOUNTER — Encounter: Payer: Self-pay | Admitting: Adult Health

## 2010-11-10 ENCOUNTER — Other Ambulatory Visit: Payer: 59

## 2010-11-10 ENCOUNTER — Ambulatory Visit (INDEPENDENT_AMBULATORY_CARE_PROVIDER_SITE_OTHER): Payer: 59 | Admitting: Adult Health

## 2010-11-10 ENCOUNTER — Other Ambulatory Visit: Payer: Self-pay | Admitting: Adult Health

## 2010-11-10 ENCOUNTER — Telehealth (INDEPENDENT_AMBULATORY_CARE_PROVIDER_SITE_OTHER): Payer: Self-pay | Admitting: *Deleted

## 2010-11-10 DIAGNOSIS — J069 Acute upper respiratory infection, unspecified: Secondary | ICD-10-CM

## 2010-11-10 LAB — BETA STREP SCREEN: Streptococcus, Group A Screen (Direct): NEGATIVE

## 2010-11-14 NOTE — Assessment & Plan Note (Signed)
Summary: Acute NP office visit - URI   Primary Provider/Referring Provider:  [place]  CC:  dry cough, wheezing, increased SOB, hoarseness, and body aches x1days.  History of Present Illness: 7  yowf never smoker who has a history of sarcoid w/ positive biopsy for noncaseating granulomatous inflammation in 1994. Prev. on chronic steroids off since around 2005  w. follow w/ no active sarcoid. Has hx of atypical chest pain evaluated in November 12, 2005 and felt to be irritable bowel syndrome and GERD.     June 20, 2009 Acute visit.  Pt c/o cough x several days.  She states that cough is non prod, but she coughs to the point of vomiting.  Also c/o muscle aches.  Took flu shot at work > 1 week prior to onset.  rec rx as uri/ viral and suppress acid reflux.  Jan 10, 2010 Acute visit.  Pt c/o head and chest congestion x 2 days with non prod.  She also has had some wheezing when she lies down and increased SOB with exertion. cough is dry to point of vomiting -  did not activate plan for gerd rx as rec and rob dm no t helping.  no purulent sputum, HA, scratchy throat.   May 23, 2010 --Presents for an acute office visit. Complains of sinus  and chest congestion for 3 days. Also has sore throat and non prod cough. Coughs so hard she near vomits. - Body aches -and hoarseness for last 3 days. She is out of  Tramadol. Blowing out green /yellow mucus. OTC not helping. Dx URI-tx w/ zpack.   May 29, 2010 --Presents for work in visit, Complains of persistent symptoms. Seen last week with bronchitic complaints tx w/ zpack. Has had no improvment in cough or congestion. Developed n/v on 05/27/10 w/ subsequent ER visit. Tx w/ IVF and phenergan. XRay with no acute changes. labs showed ? positive udip. repeated today w/ some bacteria. labs were unrevealing. She has not filled her  filled phenergan. No further vomitting but no appetitie and lots of nausea. Still has very deep cough. with   green/yellow mucus, hoarseness - states tramadol hasn't helped, finished zpak yesterday. Denies chest pain, orthopnea, hemoptysis, fever,  diarrhea  edema, headache.  October 17, 2010--Presents for an acute office visit. Complains that she is under alot of stress , esp at work is having alot of anxiety and panic attacks.She is very tearful during visit. No suicidal ideations. Has noted increased SOB, wheezing esp at night,   dry cough, chills x1week.No discolored mucus. She does not have a PCP. We discussed referral to discuss her anxiety issues. Says she always feels she will be in trouble at work, about losing job because she gets sick alot.  --  November 10, 2010 --Presents for an acute office. Complains of dry cough, wheezing, increased SOB, hoarseness, body aches x1days. NO otc used. Throat is sore. Denies chest pain, orthopnea, hemoptysis, fever, n/v/d, edema, headache. Feels run down . Worse this am.   Medications Prior to Update: 1)  Nexium 40 Mg Cpdr (Esomeprazole Magnesium) .Marland Kitchen.. 1 By Mouth Once Daily 2)  Robitussin Dm 100-10 Mg/42ml Syrp (Dextromethorphan-Guaifenesin) .... As Directed As Needed 3)  Tramadol Hcl 50 Mg  Tabs (Tramadol Hcl) .... One To Two By Mouth Every 4-6 Hours As Needed For Cough or Pain 4)  Xanax 0.5 Mg Tabs (Alprazolam) .... 1/2-1 By Mouth Three Times A Day As Needed Anxiety 5)  Propranolol Hcl 10 Mg Tabs (Propranolol Hcl) .Marland KitchenMarland KitchenMarland Kitchen  Take 1 Tablet By Mouth Four Times A Day As Needed Chest Pain 6)  Prednisone 10 Mg Tabs (Prednisone) .... 2 By Mouth Once Daily X 1 Week Then 1 By Mouth Once Daily X 1 Week Then 1/2 Once Daily X 1 Week and Stop  Current Medications (verified): 1)  Nexium 40 Mg Cpdr (Esomeprazole Magnesium) .Marland Kitchen.. 1 By Mouth Once Daily 2)  Robitussin Dm 100-10 Mg/63ml Syrp (Dextromethorphan-Guaifenesin) .... As Directed As Needed 3)  Tramadol Hcl 50 Mg  Tabs (Tramadol Hcl) .... One To Two By Mouth Every 4-6 Hours As Needed For Cough or Pain 4)  Xanax 0.5 Mg Tabs  (Alprazolam) .... 1/2-1 By Mouth Three Times A Day As Needed Anxiety 5)  Propranolol Hcl 10 Mg Tabs (Propranolol Hcl) .... Take 1 Tablet By Mouth Four Times A Day As Needed Chest Pain  Allergies (verified): 1)  ! Codeine  Past History:  Past Medical History: Last updated: 01/10/2010 Sarcoidosis    -positive biopsy for NCG   1994. Prev. on steroids x several years only Breast fibrocystic-prev. 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 No Comments Medications reviewed with patient Daytime contact number verified with patient. Boone Master CNA/MA  November 10, 2010 3:25 PM    Physical Exam  Additional Exam:  amb wf nad  wt   188 June 20, 2009 > 187 Jan 10, 2010 >>185 05/23/10>>184 10/4 >193 2/21>>192 November 10, 2010  HEENT: nl dentition,  , and orophanx. Nl external ear canals without cough reflex, clear nasal drainage, non tender sinus.  NECK :  without JVD/Nodes/TM/ nl carotid upstrokes bilaterally LUNGS coarse BS w/ no wheezing  CV:  RRR  no s3 or murmur or increase in P2, no edema  ABD:  soft and nontender with nl excursion in the supine position. No bruits or organomegaly, bowel sounds  nl, no guarding.  MS:  warm without deformities, calf tenderness, cyanosis or clubbing Pschy: tearful, anxious      Impression & Recommendations:  Problem # 1:  URI, ACUTE (ICD-465.9)  Zyrtec 10mg  by mouth at bedtime x 1 week then as needed for drainage.  Mucinex DM two times a day as needed cough .  Tramadol 50mg  1-2 every 4 hr as needed cough-may make you sleepy.  Salt water gargles as needed sore throat. Majic Mouthwash 1 tsp four times a day as needed sore throat , swish and swallow.  Please contact office for sooner follow up if symptoms do not improve or worsen  Her updated medication list for this problem includes:  Robitussin Dm 100-10 Mg/58ml Syrp (Dextromethorphan-guaifenesin) .Marland Kitchen... As directed as needed  Orders: TLB-Beta Strep Screen (87880-STRSCR) Est. Patient Level III ([address])  Medications Added to Medication List This Visit: 1)  Tramadol Hcl 50 Mg Tabs (Tramadol hcl) .Marland Kitchen.. 1-2 by mouth every 4-6 hrs as needed 2)  First-dukes Mouthwash Susp (Diphenhyd-hydrocort-nystatin) .Marland Kitchen.. 1 tsp four times a day as needed sore throat , swish and swallow.  Patient Instructions: 1)  Zyrtec 10mg  by mouth at bedtime x 1 week then as needed for drainage.  2)  Mucinex DM two times a day as needed cough .  3)  Tramadol 50mg  1-2 every 4 hr as needed cough-may make you sleepy.  4)  Salt water gargles as needed sore throat. 5)  Majic Mouthwash 1 tsp four times a day as needed sore throat , swish and swallow.  6)  Please contact office for sooner follow up if symptoms do not improve or worsen  Prescriptions: FIRST-DUKES MOUTHWASH  SUSP (DIPHENHYD-HYDROCORT-NYSTATIN) 1 tsp four times a day as needed sore throat , swish and swallow.  #4 oz x 0   Entered and Authorized by:   Rubye Oaks NP   Signed by:   Rubye Oaks NP on 11/10/2010   Method used:   Electronically to        [facility]* (retail)       [address]       [address] Shipping/mailing        [place], [place]  [address]       Ph: [phone]       Fax: [phone]   RxID:   [id number] TRAMADOL HCL 50 MG TABS (TRAMADOL HCL) 1-2 by mouth every 4-6 hrs as needed  #30 x 0   Entered and Authorized by:   Rubye Oaks NP   Signed by:   Rubye Oaks NP on 11/10/2010   Method used:   Electronically to        [facility]* (retail)       [address]       [address] Shipping/mailing       [place], [place]  [address]       Ph: [phone]       Fax: [phone]   RxID:   [id number]

## 2010-11-14 NOTE — Progress Notes (Signed)
Summary: cough/ vomiting--sched TP at 3:15  Phone Note Call from Patient Call back at Home Phone [phone]   Caller: Patient Call For: wert Summary of Call: pt is "barely audible"- losing her voice. c/o cough/ vomiting x 1 day. denies fever- cone out pt pharm Initial call taken by: Tivis Ringer, CNA,  November 10, 2010 1:52 PM  Follow-up for Phone Call        Called and spoke with pt and she c/o losing voice, nausea, vomiting, dry cough, feels congested, sore throat, wheezing, pt denies any fever. Pt has tried mucinex and it's not helping. Pt is coming in to see TP  at 3:15 [facility]  November 10, 2010 2:33 PM

## 2010-11-14 NOTE — Letter (Signed)
Summary: Out of Work  [facility]  [address]   [place], [place] [address]   Phone: [phone]  Fax: [phone]    November 10, 2010   Employee:  [name]    To Whom It May Concern:   For Medical reasons, please excuse the above named employee from work for the following dates:  Start:   Friday November 10, 2010  End:   Tuesday November 14, 2010 - may return to work on this date.  If you need additional information, please feel free to contact our office.         Sincerely,        [name], N.P.

## 2010-11-15 ENCOUNTER — Encounter: Payer: Self-pay | Admitting: Internal Medicine

## 2010-11-20 ENCOUNTER — Ambulatory Visit (INDEPENDENT_AMBULATORY_CARE_PROVIDER_SITE_OTHER): Payer: 59 | Admitting: Internal Medicine

## 2010-11-20 ENCOUNTER — Encounter: Payer: Self-pay | Admitting: Internal Medicine

## 2010-11-20 ENCOUNTER — Ambulatory Visit (INDEPENDENT_AMBULATORY_CARE_PROVIDER_SITE_OTHER)
Admission: RE | Admit: 2010-11-20 | Discharge: 2010-11-20 | Disposition: A | Discharge status: Home / Self Care | Payer: 59 | Source: Ambulatory Visit | Attending: Internal Medicine | Admitting: Internal Medicine

## 2010-11-20 VITALS — BP 120/64 | HR 60 | Temp 97.4°F | Ht 70.0 in | Wt 193.4 lb

## 2010-11-20 DIAGNOSIS — R05 Cough: Secondary | ICD-10-CM

## 2010-11-20 DIAGNOSIS — R059 Cough, unspecified: Secondary | ICD-10-CM | POA: Insufficient documentation

## 2010-11-20 NOTE — Patient Instructions (Addendum)
Unlike when you get a prescription for eyeglasses, it's not possible to always walk out of this or any medical office with a perfect prescription that is immediately effective  based on any test that we offer here.    On the contrary, it may take several weeks for the full impact of changes recommened today - hopefully you will respond well.  If not, then we'll adjust your medication on your next visit accordingly, knowing more then than we can possibly know now.       Take delsym two tsp every 12 hours and supplement if needed with  tramadol 50 mg up to 2 every 4 hours to suppress the urge to cough. Swallowing water or using ice chips/non mint and menthol containing candies (such as lifesavers or sugarless jolly ranchers) are also effective.  You should rest your voice and avoid activities that you know make you cough.  Once you have eliminated the cough for 3 straight days try reducing the tramadol first,  then the delsym as tolerated.    Add pepcid 20 mg one at bedtime  Prednisone Take 4 for two days, three for two days, two for two days, then one for two days and stop   GERD (REFLUX)  is an extremely common cause of respiratory symptoms, many times with no significant heartburn at all.    It can be treated with medication, but also with lifestyle changes including avoidance of late meals, excessive alcohol, smoking cessation, and avoid fatty foods, chocolate, peppermint, colas, red wine, and acidic juices such as orange juice.  NO MINT OR MENTHOL PRODUCTS SO NO COUGH DROPS  USE SUGARLESS CANDY INSTEAD (jolley ranchers or Stover's)  NO OIL BASED VITAMINS   Return in two weeks with all medications in hand.

## 2010-11-20 NOTE — Progress Notes (Signed)
Subjective:    Patient ID: Kathleen [name], female    DOB: Aug 31, 1960, 50 y.o.   MRN: [id number]  HPI   39 yowf never smoker who has a history of sarcoid w/ positive biopsy for noncaseating granulomatous inflammation in 1994. Prev. on chronic steroids off since around 2005 w. follow w/ no active sarcoid. Has hx of atypical chest pain evaluated in November 12, 2005 and felt to be irritable bowel syndrome and GERD.   June 20, 2009 Acute visit. Pt c/o cough x several days. She states that cough is non prod, but she coughs to the point of vomiting. Also c/o muscle aches. Took flu shot at work > 1 week prior to onset. rec rx as uri/ viral and suppress acid reflux.   Jan 10, 2010 Acute visit. Pt c/o head and chest congestion x 2 days with non prod. She also has had some wheezing when she lies down and increased SOB with exertion. cough is dry to point of vomiting - did not activate plan for gerd rx as rec and rob dm not helping. no purulent sputum, HA, scratchy throat.   May 23, 2010 --Presents for an acute office visit. Complains of sinus and chest congestion for 3 days. Also has sore throat and non prod cough. Coughs so hard she near vomits. - Body aches -and hoarseness for last 3 days. She is out of Tramadol. Blowing out green /yellow mucus. OTC not helping. Dx URI-tx w/ zpack.   May 29, 2010 --Presents for work in visit, Complains of persistent symptoms. Seen last week with bronchitic complaints tx w/ zpack. Has had no improvment in cough or congestion. Developed n/v on 05/27/10 w/ subsequent ER visit. Tx w/ IVF and phenergan. XRay with no acute changes. labs showed ? positive udip. repeated today w/ some bacteria. labs were unrevealing. She has not filled her filled phenergan. No further vomitting but no appetitie and lots of nausea. Still has very deep cough. with  green/yellow mucus, hoarseness - states tramadol hasn't helped, finished   October 17, 2010--Presents for an acute office  visit. Complains that she is under alot of stress , esp at work is having alot of anxiety and panic attacks.She is very tearful during visit. No suicidal ideations. Has noted increased SOB, wheezing esp at night, dry cough, chills x1week.No discolored mucus. rec   Patient Instructions:  1) Prednisone taper as directed.  2) Xanax as needed anxiety      11/20/2010  Ov cc cough day and night  X 6 weeks  not producing sputum.  Did not take prednisone and tramadol at the same time. No better at all.    Pt denies any significant assoc  sore throat, dysphagia, itching, sneezing,  nasal congestion or excess/ purulent secretions,  fever, chills, sweats, unintended wt loss, pleuritic or exertional cp, hempoptysis, orthopnea pnd or leg swelling.    Also denies any obvious fluctuation of symptoms with weather or environmental changes or other aggravating or alleviating factors.      Past Medical History:  Sarcoidosis  -positive biopsy for NCG 1994. Prev. on steroids x several years only  Breast fibrocystic-prev. bx   Family History:  Maternal grandparents-cardiac dz-died w/ MI age 64  Mother-cardiomyopathy  Aunts -CABG (age 71)  Breast cancer-mother   Social History:  Last updated: 11/22/2008  Works for [employer] payable at [employer] fulltime  limited exercise  Married  1 kid-age 89  never smoker  Risk Factors:  Smoking Status: never (06/21/2008)  Review of Systems     Objective:   Physical Exam Anxious wf hoarse with barking harsh upper airway pattern cough   wt 188 June 20, 2009 > 187 Jan 10, 2010 >>185 05/23/10>>184 10/4 >193 2/21 > 193 11/20/2010  HEENT: nl dentition, , and orophanx. Nl external ear canals without cough reflex, clear nasal drainage, non tender sinus.  NECK : without JVD/Nodes/TM/ nl carotid upstrokes bilaterally  LUNGS coarse BS w/ no wheezing  CV: RRR no s3 or murmur or increase in P2, no edema  ABD: soft and nontender with nl excursion in the  supine position. No bruits or organomegaly, bowel sounds nl, no guarding.  MS: warm without deformities, calf tenderness, cyanosis or clubbing  Pschy:  anxious / hopeless helpless affect and attitude     Assessment & Plan:

## 2010-11-20 NOTE — Assessment & Plan Note (Signed)
The most common causes of chronic cough in immunocompetent adults include the following: upper airway cough syndrome (UACS), previously referred to as postnasal drip syndrome (PNDS), which is caused by variety of rhinosinus conditions; (2) asthma; (3) GERD; (4) chronic bronchitis from cigarette smoking or other inhaled environmental irritants; (5) nonasthmatic eosinophilic bronchitis; and (6) bronchiectasis.   These conditions, singly or in combination, have accounted for up to 94% of the causes of chronic cough in prospective studies.   Other conditions have constituted no >6% of the causes in prospective studies These have included bronchogenic carcinoma, chronic interstitial pneumonia, sarcoidosis, left ventricular failure, ACEI-induced cough, and aspiration from a condition associated with pharyngeal dysfunction.   This is most c/w  Classic Upper airway cough syndrome, so named because it's frequently impossible to sort out how much is  CR/sinusitis with freq throat clearing (which can be related to primary GERD)   vs  causing  secondary (" extra esophageal")  GERD from wide swings in gastric pressure that occur with throat clearing, often  promoting self use of mint and menthol lozenges that reduce the lower esophageal sphincter tone and exacerbate the problem further in a cyclical fashion.   These are the same pts who not infrequently have failed to tolerate ace inhibitors,  dry powder inhalers or biphosphonates or report having reflux symptoms that don't respond to standard doses of PPI , and are easily confused as having aecopd or asthma flares,   Of the three most common causes of chronic cough, only one (GERD)  can actually cause the other two (asthma and post nasal drip syndrome)  and perpetuate the cylce of cough inducing airway trauma, inflammation, heightened sensitivity to reflux which is prompted by the cough itself via a cyclical mechanism.    This may partially respond to steroids and  look like asthma and post nasal drainage but never erradicated completely unless the cough and the secondary reflux are eliminated, preferably both at the same time.  While not intuitively obvious, many patients with chronic low grade reflux do not cough until there is a secondary insult that disturbs the protective epithelial barrier and exposes sensitive nerve endings.  This can be viral or direct physical injury such as with an endotracheal tube.   The point is that once this occurs, it is difficult to eliminate using anything but a maximally effective acid suppression regimen at least in the short run, accompanied by an appropriate diet to address non acid GERD.

## 2010-11-21 ENCOUNTER — Telehealth: Payer: Self-pay | Admitting: Internal Medicine

## 2010-11-21 NOTE — Telephone Encounter (Signed)
Called, spoke with pt.  States she has [employer] forms that need to be filled out by Dr. Sherene Sires and would like to know what she needs to do with them.  I advised pt to take them to [facility] [place] as they take care of these forms.  She verbalized understanding of this.

## 2010-11-22 NOTE — Telephone Encounter (Signed)
Kathleen [name], pt has some concerns.  She is requesting to speak to you about them.  Pt did not want to make an OV at this time.  Just would like to speak to you about her concerns because she "trusts" you.  Pt aware you will be out of office until Thursday or Friday and ok with this.  Will you pls call her.  Thanks!

## 2010-11-23 ENCOUNTER — Other Ambulatory Visit: Payer: Self-pay | Admitting: Internal Medicine

## 2010-11-23 ENCOUNTER — Other Ambulatory Visit: Payer: Self-pay | Admitting: General Surgery

## 2010-11-23 DIAGNOSIS — N6310 Unspecified lump in the right breast, unspecified quadrant: Secondary | ICD-10-CM

## 2010-11-24 NOTE — Telephone Encounter (Signed)
ATC pt on her cell number which is also considered her home number and NA and VM box not set up so could not leave msg.  Called her work number listed and was able to leave msg on her VM. Will await a call back.

## 2010-11-24 NOTE — Telephone Encounter (Signed)
I have tried x 2 to call her back. Used both numbers and get voicemail message that is not set up .  Please see if she will let yall know what I can help with  Thanks Tam

## 2010-11-24 NOTE — Telephone Encounter (Signed)
I have tried to call x 2 - message says voice mail not set up  Please see if she still needs to speak with me and get correct no.

## 2010-11-24 NOTE — Telephone Encounter (Signed)
Spoke with pt.  She states that TP can call her on her Cell number any time after 4 pm today.  Will forward to TP.

## 2010-11-27 NOTE — Telephone Encounter (Signed)
Spoke with pt and she is feeling better.  Cont follow up as planned.

## 2010-11-27 NOTE — Telephone Encounter (Signed)
Called and spoke with pt this morning and she states he wants to talk to [facility] about her concerns and did not want to explain to me. Pt states she will be home after 4 p.m. Today and to call her at [phone]. Please advise [facility]. Thanks

## 2010-12-04 ENCOUNTER — Encounter: Payer: Self-pay | Admitting: Internal Medicine

## 2010-12-06 ENCOUNTER — Ambulatory Visit: Payer: 59 | Admitting: Internal Medicine

## 2010-12-07 LAB — DIFFERENTIAL
Basophils Absolute: 0 10*3/uL (ref 0.0–0.1)
Basophils Relative: 1 % (ref 0–1)
Eosinophils Absolute: 0.2 10*3/uL (ref 0.0–0.7)
Eosinophils Relative: 3 % (ref 0–5)
Lymphocytes Relative: 22 % (ref 12–46)
Lymphs Abs: 1.2 10*3/uL (ref 0.7–4.0)
Monocytes Absolute: 0.5 10*3/uL (ref 0.1–1.0)
Monocytes Relative: 10 % (ref 3–12)
Neutro Abs: 3.5 10*3/uL (ref 1.7–7.7)
Neutrophils Relative %: 65 % (ref 43–77)

## 2010-12-07 LAB — BASIC METABOLIC PANEL
BUN: 11 mg/dL (ref 6–23)
CO2: 28 mEq/L (ref 19–32)
Calcium: 9.5 mg/dL (ref 8.4–10.5)
Chloride: 103 mEq/L (ref 96–112)
Creatinine, Ser: 0.86 mg/dL (ref 0.4–1.2)
GFR calc Af Amer: >60 mL/min (ref >60)
GFR calc non Af Amer: >60 mL/min (ref >60)
Glucose, Bld: 106 mg/dL — ABNORMAL HIGH (ref 70–99)
Potassium: 3 mEq/L — ABNORMAL LOW (ref 3.5–5.1)
Sodium: 140 mEq/L (ref 135–145)

## 2010-12-07 LAB — POCT CARDIAC MARKERS
CKMB, poc: <1 ng/mL — ABNORMAL LOW (ref 1.0–8.0)
Myoglobin, poc: 48.7 ng/mL (ref 12–200)
Troponin i, poc: <0.05 ng/mL (ref 0.00–0.09)

## 2010-12-07 LAB — CBC
HCT: 35.5 % — ABNORMAL LOW (ref 36.0–46.0)
Hemoglobin: 12.3 g/dL (ref 12.0–15.0)
MCHC: 34.6 g/dL (ref 30.0–36.0)
MCV: 85.9 fL (ref 78.0–100.0)
Platelets: 333 10*3/uL (ref 150–400)
RBC: 4.13 MIL/uL (ref 3.87–5.11)
RDW: 14.1 % (ref 11.5–15.5)
WBC: 5.4 10*3/uL (ref 4.0–10.5)

## 2010-12-25 ENCOUNTER — Encounter: Payer: Self-pay | Admitting: Internal Medicine

## 2010-12-27 ENCOUNTER — Ambulatory Visit: Payer: 59 | Admitting: Internal Medicine

## 2011-01-08 ENCOUNTER — Encounter (INDEPENDENT_AMBULATORY_CARE_PROVIDER_SITE_OTHER): Payer: Self-pay | Admitting: General Surgery

## 2011-01-09 ENCOUNTER — Telehealth: Payer: Self-pay | Admitting: Cardiovascular Disease

## 2011-01-09 NOTE — Telephone Encounter (Signed)
[facility] WITH [facility] NEEDS RECORDS LAST OFFICE NOTE ,EKG, STRESS, ECHO FAX [phone]  SURG. FRI 5/18

## 2011-01-09 NOTE — Assessment & Plan Note (Signed)
[facility]                             PULMONARY OFFICE NOTE   Kathleen [name], Kathleen [name]                        MRN:          [id number]  DATE:04/11/2007                            DOB:          1960/10/19    This is a 50 year old white female who has a history of sarcoid with  atypical chest pain evaluated in November 12, 2005 and felt to be irritable  bowel syndrome. She underwent a CT scan dated March 20 with findings  consistent with progressive sarcoid and was supposed to return to  follow up but never did. She says she did not follow up because she felt  fine on the Citrucel.   However, she comes in now with complaints of progressive weight gain and  fatigue over the last several months, and now for the last week has been  having intermittent left-sided chest discomfort that she thinks is  located about the same place as it was before, but more severe, and is  pleuritic and positional in nature. It is made worse with walking. It is  not associated however, with any nausea, vomiting, fevers, chills,  sweats, and does not radiate. She was seen in the emergency room in  [place] for this pain, and had a negative cardiac workup, as well as  chest x-ray which actually showed improvement compared to previous  studies. She was given Toradol which she said helps some, and also  Vicodin which she says she can not take and work, and so has not been  taking.   The patient denies any ocular, or articular symptoms, or rash consistent  with sarcoid, nor significant dyspnea or cough even during episodes of  pain, which wax and wane over a period from minutes to hours, but never  occur while sleeping.   PAST MEDICAL HISTORY:  Significant for sarcoidosis as noted above  documented in 1994 with a transbronchial biopsy.   ALLERGIES:  None known.   MEDICATIONS:  She was very vague about her medicines and is not  consistent about using Citrucel. She also has Pepcid  chewable, uses  Advil occasionally. Does not think the Pepcid chewable helps.   SOCIAL HISTORY:  She has never smoked. She has a 23 year old son who  drives her nuts and is concerned that she allowed the 30 minutes to  eat and she eats bad food because that is all she has available.   FAMILY HISTORY:  Negative for sarcoid or asthma or premature heart  disease.   PHYSICAL EXAMINATION:  This is a somewhat depressed appearing,  ambulatory white female in no acute distress. She is afebrile, stable  vital signs.  HEENT: Unremarkable. Pharynx clear.  NECK: Supple without cervical adenopathy or tenderness. Trachea is  midline. No thyromegaly.  LUNG FIELDS: Perfectly clear bilaterally to auscultation and percussion.  There is a regular rate and rhythm without murmur, gallop, rub.  ABDOMEN: Soft, benign.  EXTREMITIES: Warm without calf tenderness, cyanosis, clubbing, or edema.   Chest x-ray was reviewed from [facility] dated April 08, 2007  and shows improvement in sarcoid changes in  the apices. The troponins  were normal. CBC was notable for hematocrit of 33.6 with an MVC of 82.8.  Sed rate and LFTs as well as ACE level are all pending from today's  visit. Calcium level was 9.6 when she was seen.   IMPRESSION:  No evidence of active sarcoid. Her x-ray shows improvement  today despite the fact that her CT scan dated a year ago showed  progressive disease. Note she is not really limited by dyspnea so much  as she is fatigue, and has been experiencing significant weight gaining  for which I recommended a TSH, and spent extra time talking about  calorie balance issues.   I note for the record she also weighed 144 pounds when we first meet her  here in 1994 and is gaining weight steadily, although not at the  morbidly obese state yet. I spent extra time reviewing calorie balance  issues with her and recommending a nutrition evaluation.   The main issue however for today was to  determine the cause of her left-  sided chest pain. I believe it is almost identical to what she had a  year ago and seemed to improve with Citrucel a teaspoon b.i.d.  indicating that it probably was splenic flexure syndrome. Note the  absence of pain while supine is strongly indicative of this. As is  midline anterior pleuritic pain for which pulmonary embolism would be  extremely unlikely especially in the absence of associated dyspnea.   Therefore I recommended Citrucel 1 teaspoon b.i.d., reviewed a diet with  her and also asked her to empirically start Prilosec before breakfast  daily. I recommended Librax 1 q. 6 p.r.n. for the pain. I do not  recommend that she take Vioxx, Vicodin, and do not see the reason why  she can not return to work and take p.r.n. Librax. If the pain worsens  over the next 2 weeks I recommend a CT scan of the chest. If it improves  by the end of 2 weeks she can follow up with Dr. Sudie Bailey and see [place]  here p.r.n. because I do not believe she is at risk of recurrent sarcoid  at this point.     Charlaine Dalton. Sherene Sires, MD, [facility]  Electronically Signed    MBW/MedQ  DD: 04/11/2007  DT: 04/11/2007  Job #: [id number]   cc:   Mila Homer. Sudie Bailey, M.D.

## 2011-01-10 ENCOUNTER — Encounter (HOSPITAL_BASED_OUTPATIENT_CLINIC_OR_DEPARTMENT_OTHER)
Admission: RE | Admit: 2011-01-10 | Discharge: 2011-01-10 | Disposition: A | Discharge status: Home / Self Care | Payer: 59 | Source: Ambulatory Visit | Attending: General Surgery | Admitting: General Surgery

## 2011-01-10 LAB — COMPREHENSIVE METABOLIC PANEL
ALT: 15 U/L (ref 0–35)
AST: 18 U/L (ref 0–37)
Albumin: 3.5 g/dL (ref 3.5–5.2)
Alkaline Phosphatase: 74 U/L (ref 39–117)
BUN: 13 mg/dL (ref 6–23)
CO2: 25 mEq/L (ref 19–32)
Calcium: 9.5 mg/dL (ref 8.4–10.5)
Chloride: 104 mEq/L (ref 96–112)
Creatinine, Ser: 0.8 mg/dL (ref 0.4–1.2)
GFR calc Af Amer: >60 mL/min (ref >60)
GFR calc non Af Amer: >60 mL/min (ref >60)
Glucose, Bld: 94 mg/dL (ref 70–99)
Potassium: 3.6 mEq/L (ref 3.5–5.1)
Sodium: 138 mEq/L (ref 135–145)
Total Bilirubin: 0.3 mg/dL (ref 0.3–1.2)
Total Protein: 7.2 g/dL (ref 6.0–8.3)

## 2011-01-10 LAB — CBC
HCT: 34.5 % — ABNORMAL LOW (ref 36.0–46.0)
Hemoglobin: 11.8 g/dL — ABNORMAL LOW (ref 12.0–15.0)
MCH: 28 pg (ref 26.0–34.0)
MCHC: 34.2 g/dL (ref 30.0–36.0)
MCV: 81.9 fL (ref 78.0–100.0)
Platelets: 311 10*3/uL (ref 150–400)
RBC: 4.21 MIL/uL (ref 3.87–5.11)
RDW: 14.5 % (ref 11.5–15.5)
WBC: 6 10*3/uL (ref 4.0–10.5)

## 2011-01-10 LAB — URINALYSIS, ROUTINE W REFLEX MICROSCOPIC
Bilirubin Urine: NEGATIVE
Glucose, UA: NEGATIVE mg/dL
Hgb urine dipstick: NEGATIVE
Ketones, ur: NEGATIVE mg/dL
Nitrite: NEGATIVE
Protein, ur: NEGATIVE mg/dL
Specific Gravity, Urine: 1.006 (ref 1.005–1.030)
Urobilinogen, UA: 1 mg/dL (ref 0.0–1.0)
pH: 6.5 (ref 5.0–8.0)

## 2011-01-10 LAB — DIFFERENTIAL
Basophils Absolute: 0 10*3/uL (ref 0.0–0.1)
Basophils Relative: 0 % (ref 0–1)
Eosinophils Absolute: 0.2 10*3/uL (ref 0.0–0.7)
Eosinophils Relative: 3 % (ref 0–5)
Lymphocytes Relative: 22 % (ref 12–46)
Lymphs Abs: 1.3 10*3/uL (ref 0.7–4.0)
Monocytes Absolute: 0.6 10*3/uL (ref 0.1–1.0)
Monocytes Relative: 9 % (ref 3–12)
Neutro Abs: 4 10*3/uL (ref 1.7–7.7)
Neutrophils Relative %: 66 % (ref 43–77)

## 2011-01-10 LAB — URINE MICROSCOPIC-ADD ON

## 2011-01-12 ENCOUNTER — Other Ambulatory Visit: Payer: Self-pay | Admitting: General Surgery

## 2011-01-12 ENCOUNTER — Ambulatory Visit
Admission: RE | Admit: 2011-01-12 | Discharge: 2011-01-12 | Disposition: A | Discharge status: Home / Self Care | Payer: 59 | Source: Ambulatory Visit | Attending: General Surgery | Admitting: General Surgery

## 2011-01-12 ENCOUNTER — Ambulatory Visit (HOSPITAL_BASED_OUTPATIENT_CLINIC_OR_DEPARTMENT_OTHER)
Admission: RE | Admit: 2011-01-12 | Discharge: 2011-01-12 | Disposition: A | Discharge status: Home / Self Care | Payer: 59 | Source: Ambulatory Visit | Attending: General Surgery | Admitting: General Surgery

## 2011-01-12 ENCOUNTER — Other Ambulatory Visit (INDEPENDENT_AMBULATORY_CARE_PROVIDER_SITE_OTHER): Payer: Self-pay | Admitting: General Surgery

## 2011-01-12 DIAGNOSIS — N6310 Unspecified lump in the right breast, unspecified quadrant: Secondary | ICD-10-CM

## 2011-01-12 DIAGNOSIS — R011 Cardiac murmur, unspecified: Secondary | ICD-10-CM | POA: Insufficient documentation

## 2011-01-12 DIAGNOSIS — K219 Gastro-esophageal reflux disease without esophagitis: Secondary | ICD-10-CM | POA: Insufficient documentation

## 2011-01-12 DIAGNOSIS — D249 Benign neoplasm of unspecified breast: Secondary | ICD-10-CM | POA: Insufficient documentation

## 2011-01-12 HISTORY — PX: BREAST LUMPECTOMY: SHX2

## 2011-01-12 NOTE — Op Note (Signed)
NAMEMAVERY, MILLING               ACCOUNT NO.:  [id number]   MEDICAL RECORD NO.:  [id number]          PATIENT TYPE:  AMB   LOCATION:  DSC                          FACILITY:  [facility]   PHYSICIAN:  Rose Phi. Maple Hudson, M.D.   DATE OF BIRTH:  Jan 11, 1961   DATE OF PROCEDURE:  06/15/2004  DATE OF DISCHARGE:                                 OPERATIVE REPORT   PREOPERATIVE DIAGNOSIS:  Dense fibrocystic disease of the right breast with  mass.   POSTOPERATIVE DIAGNOSIS:  Dense fibrocystic disease of the right breast with  mass.   OPERATION PERFORMED:  Excision of right breast mass.   SURGEON:  Rose Phi. Maple Hudson, M.D.   ANESTHESIA:  MAC.   DESCRIPTION OF PROCEDURE:  The patient was placed on the operating table and  the right breast prepped and draped in the usual fashion.  A curvilinear  incision was outlined over the palpable abnormality and then the area  thoroughly infiltrated with a local anesthetic mixture.  Incision was made  and I excised this large area of dense fibrotic breast tissue.  It did not  appear to be malignant.  Hemostasis was obtained with the cautery.  Incision  was closed in two layers with 3-0 Vicryl and subcuticular 4-0 Monocryl and  Steri-Strips.  Dressing applied.  The patient was then transferred to the  recovery room in satisfactory condition, having tolerated the procedure  well.      Pete   PRY/MEDQ  D:  06/15/2004  T:  06/15/2004  Job:  [id number]

## 2011-01-12 NOTE — Op Note (Signed)
Kathleen [name], Kathleen [name]                         ACCOUNT NO.:  [id number]   MEDICAL RECORD NO.:  [id number]                   PATIENT TYPE:  OUT   LOCATION:  RAD                                  FACILITY:  [facility]   PHYSICIAN:  Langley Gauss, M.D.                DATE OF BIRTH:  Dec 08, 1960   DATE OF PROCEDURE:  08/13/2002  DATE OF DISCHARGE:  08/13/2002                                  PROCEDURE NOTE   PROCEDURE:  Hysterosalpingogram performed in the department of radiology,  performed by Langley Gauss, M.D.   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   DESCRIPTION OF PROCEDURE:  Appropriate informed consent was obtained by the  patient.  The patient had just recently completed her menses.  She was  placed in the lithotomy position, at which time pelvic examination revealed  normal external genitalia.  Sterile speculum examination reveals a normal-  appearing cervix with no bleeding identified.  Posterior lip of the cervix  is grasped with a single-tooth tenaculum.  The reusable acorn device was  utilized, which easily passed through the endocervix and into the lower  uterine segment.  This had been flushed previously with the contrast media.  I was then assisted by one of the radiology technicians, and it was done  under fluoroscopy.  As I slowly injected the contrast material, the tech  appropriately took pictures of images.  Multiple pictures were taken along  the way, a total of about eight, as I slowly injected the contrast media in  the uterine cavity, which was noted to be normal in appearance with the  exception of being anteflexed with what appeared to be a fundal fibroid.  The left fallopian tube appeared to spill very well with some loculation of  fluid at its spillage point.  The right fallopian tube, however, appeared to  be completely blocked with extravasation of contrast media only and no  spillage through the right  fallopian tube.  After performing the procedure,  the technologist specifically asked me which images she should save.  I  replied, All of them, and then requested that these be put on film.  The  technologist then left the room and returned with a film which inexplicably  only contained the very last image which she had taken.  I then asked if she  had saved the images which we could review directly on the monitor, and she  stated at that time that she did not save any of the images.  Thus, the  single image is shown to the radiologist, who of course has some difficulty  doing a complete interpretation as he was not present at he performance of  the hysterosalpingogram and does not have serial images for interpretation  of the spillage.   IMPRESSION:  History of secondary infertility with tubal ligation reversal.  Left tube appears to spill.  Right tube appears to be completely occluded  proximally.  Echogenic area at the fundal portion of the uterus likely  represents a moderately large fundal fibroid.                                                Langley Gauss, M.D.    DC/MEDQ  D:  08/18/2002  T:  08/18/2002  Job:  [id number]

## 2011-01-19 NOTE — Op Note (Signed)
  [name], [name]                ACCOUNT NO.:  [id number]  MEDICAL RECORD NO.:  [id number]            PATIENT TYPE:  LOCATION:                                 FACILITY:  PHYSICIAN:  Angelia Mould. Derrell Lolling, M.D.     DATE OF BIRTH:  DATE OF PROCEDURE:  01/12/2011 DATE OF DISCHARGE:                              OPERATIVE REPORT   PREOPERATIVE DIAGNOSIS:  Fibroadenoma, right breast, upper outer quadrant.  POSTOPERATIVE DIAGNOSIS:  Fibroadenoma, right breast, upper outer quadrant.  OPERATION PERFORMED:  Right partial mastectomy with needle localization.  SURGEON:  Angelia Mould. Derrell Lolling, MD.  OPERATIVE INDICATIONS:  This is a 51 year old Caucasian female, who has a history of prior breast biopsies for benign disease.  She has never been diagnosed of having cancer, although, her mother was diagnosed at age 19 with breast cancer and had a mastectomy.  Recent screening mammograms and ultrasound revealed a solid mass in the right breast at 10 o'clock position about 8 cm from the nipple.  The biopsy showed fibroadenoma.  The patient states that this area has been painful before the biopsy and subsequent to the biopsy.  She requested this area be excised because of anxiety about breast cancer and the pain.  I have counseled her as an outpatient.  She is brought to operating room electively.  OPERATIVE TECHNIQUE:  The patient underwent wire localization at [facility].  The wire entered laterally and was directed superiorly and posteriorly very near to the marker clip.  The general anesthesia was induced.  The right breast was prepped and draped in sterile fashion.  Intravenous antibiotics were given.  Surgical time- out was held, identifying correct patient, correct procedure, and correct site.  A 0.25% Marcaine with epinephrine was used a local infiltration anesthetic.  I chose to make a curvilinear incision in the upper outer quadrant of the right breast overlying  where I felt the fibroadenoma was. Dissection was carried down into the breast tissue and around the localizing wire.  The specimen was removed and marked with the six color margin marker kit.  Specimen mammogram was performed and revealed that the tip of the wire and the marker clip were within the specimen.  I was satisfied as was the radiologist.  Specimen was then sent to pathology for routine histology.  Hemostasis was excellent and achieved electrocautery.  The wound was irrigated with saline.  The deeper breast tissues were closed with interrupted sutures of 3-0 Vicryl.  The superficial breast tissue was closed with interrupted suture of 3-0 Vicryl.  The skin was closed with a running subcuticular suture of 4-0 Monocryl and Dermabond.  The patient was taken to recovery room in stable condition.  Estimated blood loss was about 15 mL.  Complications none.  Sponge, needle, and instrument counts were correct.     Angelia Mould. Derrell Lolling, M.D.     HMI/MEDQ  D:  01/12/2011  T:  01/13/2011  Job:  [id number]  cc:   Vesta Mixer, M.D.Juluis Mire, M.D. Norva Pavlov, M.D.  Electronically Signed by Claud Kelp M.D. on 01/19/2011 08:43:12 AM

## 2011-02-19 ENCOUNTER — Telehealth: Payer: Self-pay | Admitting: Cardiovascular Disease

## 2011-02-19 NOTE — Telephone Encounter (Signed)
Has been experiencing numbness in her extremities and wants to know if she should make an appointment here or with someone else.

## 2011-02-19 NOTE — Telephone Encounter (Signed)
Called/msg start with pcp and they will send you were you need to go, call us with any further questions or concerns.

## 2011-02-21 ENCOUNTER — Other Ambulatory Visit: Payer: Self-pay | Admitting: Cardiovascular Disease

## 2011-02-21 NOTE — Telephone Encounter (Signed)
Fax received from pharmacy. Refill completed. Jodette Ceaira Ernster RN  

## 2011-04-02 ENCOUNTER — Ambulatory Visit (INDEPENDENT_AMBULATORY_CARE_PROVIDER_SITE_OTHER): Payer: Commercial Managed Care - PPO | Admitting: General Surgery

## 2011-04-02 ENCOUNTER — Encounter (INDEPENDENT_AMBULATORY_CARE_PROVIDER_SITE_OTHER): Payer: Self-pay | Admitting: General Surgery

## 2011-04-02 DIAGNOSIS — Z9889 Other specified postprocedural states: Secondary | ICD-10-CM

## 2011-04-02 NOTE — Patient Instructions (Signed)
Your breast exam today is normal. The scar is well-healed. There is no sign of infection, fluid collection, or hematoma. I do not feel any breast mass. I think that the pain is you have it secondary to the surgery and possibly chronic fibrocystic disease. I advise that you use ibuprofen for pain, and heating pad, avoid caffeine and chocolate, and take vitamin E 400 units daily. Return to see me p.r.n.

## 2011-04-02 NOTE — Progress Notes (Signed)
Subjective:     Patient ID: Kathleen [name], female   DOB: 24-Apr-1961, 50 y.o.   MRN: [id number]  HPI Patient wanted me to check her right breast incision because she still having pain. Otherwise no healing problems. Review of Systems     Objective:   Physical Exam The right breast incision is well healed. The tissues are soft. There is no mass and no hematoma it doesn't appear tender to palpation. There is no axillary mass. She has multiple breast scars.    Assessment:     Right breast pain. There is no sign of complication of the surgery, so she either has fibrocystic disease or chronic nerve damage.    Plan:     She was reassured.  She was advised to discontinue caffeine and chocolate. She was advised to use ibuprofen or naproxen for pain. She was advised that vitamin E 400 international units daily.  Return to see me p.r.n.

## 2011-05-14 ENCOUNTER — Ambulatory Visit (INDEPENDENT_AMBULATORY_CARE_PROVIDER_SITE_OTHER): Payer: 59 | Admitting: Adult Health

## 2011-05-14 ENCOUNTER — Ambulatory Visit (INDEPENDENT_AMBULATORY_CARE_PROVIDER_SITE_OTHER)
Admission: RE | Admit: 2011-05-14 | Discharge: 2011-05-14 | Disposition: A | Discharge status: Home / Self Care | Payer: 59 | Source: Ambulatory Visit | Attending: Adult Health | Admitting: Adult Health

## 2011-05-14 ENCOUNTER — Encounter: Payer: Self-pay | Admitting: Adult Health

## 2011-05-14 ENCOUNTER — Encounter: Payer: Self-pay | Admitting: *Deleted

## 2011-05-14 VITALS — BP 118/80 | HR 70 | Temp 97.1°F | Ht 70.0 in | Wt 192.6 lb

## 2011-05-14 DIAGNOSIS — D869 Sarcoidosis, unspecified: Secondary | ICD-10-CM

## 2011-05-14 MED ORDER — AMOXICILLIN-POT CLAVULANATE 875-125 MG PO TABS: 1.0000 | ORAL_TABLET | Freq: Two times a day (BID) | ORAL | Status: AC

## 2011-05-14 MED ORDER — PREDNISONE 10 MG PO TABS: ORAL_TABLET | ORAL | Status: AC

## 2011-05-14 MED ORDER — ALBUTEROL SULFATE (2.5 MG/3ML) 0.083% IN NEBU
2.5000 mg | INHALATION_SOLUTION | Freq: Once | RESPIRATORY_TRACT | Status: AC
  Administered 2011-05-14: 2.5 mg via RESPIRATORY_TRACT

## 2011-05-14 NOTE — Progress Notes (Signed)
Addended by: Tommie Sams on: 05/14/2011 04:35 PM   Modules accepted: Orders

## 2011-05-14 NOTE — Patient Instructions (Signed)
Augmentin 875mg  Twice daily  Take with food for 7 days Mucinex DM Twice daily  As needed  Cough/congestion  Fluids and rest  Prednisone taper over next week.  Please contact office for sooner follow up if symptoms do not improve or worsen or seek emergency care  follow up Dr. Sherene Sires  In 2 months and As needed

## 2011-05-14 NOTE — Progress Notes (Signed)
Subjective:    Patient ID: Kathleen [name], female    DOB: August 12, 1961, 50 y.o.   MRN: [id number]  HPI 18 yowf never smoker who has a history of sarcoid w/ positive biopsy for noncaseating granulomatous inflammation in 1994. Prev. on chronic steroids off since around 2005 w. follow w/ no active sarcoid. Has hx of atypical chest pain evaluated in November 12, 2005 and felt to be irritable bowel syndrome and GERD.   May 23, 2010 --Presents for an acute office visit. Complains of sinus and chest congestion for 3 days. Also has sore throat and non prod cough. Coughs so hard she near vomits. - Body aches -and hoarseness for last 3 days. She is out of Tramadol. Blowing out green /yellow mucus. OTC not helping. Dx URI-tx w/ zpack.   May 29, 2010 --Presents for work in visit, Complains of persistent symptoms. Seen last week with bronchitic complaints tx w/ zpack. Has had no improvment in cough or congestion. Developed n/v on 05/27/10 w/ subsequent ER visit. Tx w/ IVF and phenergan. XRay with no acute changes. labs showed ? positive udip. repeated today w/ some bacteria. labs were unrevealing. She has not filled her filled phenergan. No further vomitting but no appetitie and lots of nausea. Still has very deep cough. with  green/yellow mucus, hoarseness - states tramadol hasn't helped, finished   October 17, 2010--Presents for an acute office visit. Complains that she is under alot of stress , esp at work is having alot of anxiety and panic attacks.She is very tearful during visit. No suicidal ideations. Has noted increased SOB, wheezing esp at night, dry cough, chills x1week>>steroid taper     11/20/2010  Ov cc cough day and night  X 6 weeks  not producing sputum.  Did not take prednisone and tramadol at the same time. No better at all.  >>zyrtec    05/14/2011 Acute OV  Pt complains of chest congestion and wheezing for several weeks, along with SOB at rest and exertion . Has a lot of rattling with  breathing, chest tightness. Wheezing a lot at night .  Mucus is thick can not get up at times. Has rattling esp at night.  No otc used.  ocsasional heartburn , nexium helps.  No chest pain or fever.   Past Medical History:  Sarcoidosis  -positive biopsy for NCG 1994. Prev. on steroids x several years only  Breast fibrocystic-prev. 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  HEENT:   No headaches,  Difficulty swallowing,  Tooth/dental problems, or  Sore throat,                No sneezing, itching, ear ache, nasal congestion, post nasal drip,   CV:  No chest pain,  Orthopnea, PND, swelling in lower extremities, anasarca, dizziness, palpitations, syncope.   GI  No  abdominal pain, nausea, vomiting, diarrhea, change in bowel habits, loss of appetite, bloody stools.   Resp:   No coughing up of blood.    No chest wall deformity  Skin: no rash or lesions.  GU:  no dysuria, change in color of urine, no urgency or frequency.  No flank pain, no hematuria   MS:  No joint pain or swelling.  No decreased range of motion.  No back pain.  Psych:  No change in mood or affect. No depression or anxiety.  No memory loss.             Objective:   Physical Exam Anxious wf hoarse with barking harsh upper airway pattern cough   wt 188 June 20, 2009 > 187 Jan 10, 2010 >>185 05/23/10>>184 10/4 >193 2/21 > 193 11/20/2010  >>192 05/14/2011  HEENT: nl dentition, , and orophanx. Nl external ear canals without cough reflex, clear nasal drainage, non tender sinus.  NECK : without  JVD/Nodes/TM/ nl carotid upstrokes bilaterally  LUNGS coarse BS w/ no wheezing  CV: RRR no s3 or murmur or increase in P2, no edema  ABD: soft and nontender with nl excursion in the supine position. No bruits or organomegaly, bowel sounds nl, no guarding.  MS: warm without deformities, calf tenderness, cyanosis or clubbing  Pschy:  anxious / hopeless helpless affect and attitude     Assessment & Plan:

## 2011-05-14 NOTE — Assessment & Plan Note (Signed)
Flare with associated URI  XR pending.   Plan:  Augmentin 875mg  Twice daily  Take with food for 7 days Mucinex DM Twice daily  As needed  Cough/congestion  Fluids and rest  Prednisone taper over next week.  Please contact office for sooner follow up if symptoms do not improve or worsen or seek emergency care  follow up Dr. Sherene Sires  In 2 months and As needed

## 2011-05-31 ENCOUNTER — Telehealth: Payer: Self-pay | Admitting: Pulmonary Disease

## 2011-05-31 NOTE — Telephone Encounter (Signed)
PATIENT

## 2011-05-31 NOTE — Telephone Encounter (Signed)
Called and spoke with pt.  Pt states she was told by her employer she needed to get her flu vac today.  However, pt concerned as she is having symptoms of increased sob with exertion, chest congestion, non productive cough, wheezing, and she states chest hurts d/t the congestion.  Denies fever chills or sweats.  Pt is not on abx currently but did have some prednisone left over from a previous taper and is taking this.     Spoke with TD, pt needs to be evaluated.  LMOM for pt TCB to add on to TP's schedule this afternoon.

## 2011-05-31 NOTE — Telephone Encounter (Signed)
[id number] 

## 2011-05-31 NOTE — Telephone Encounter (Signed)
Spoke with pt and I advised that she will need to be seen in the office before receiving the flu shot and or receiving a note stating otherwise d/t her symptoms. Pt states she is just going to go ahead and get the flu shot stating "what's the worst that can happen" and I advised her she could feel worse. Scheduled appointment with TP on Friday. Will sign and forward message to MW for FYI.

## 2011-06-08 ENCOUNTER — Ambulatory Visit (INDEPENDENT_AMBULATORY_CARE_PROVIDER_SITE_OTHER): Payer: 59 | Admitting: Adult Health

## 2011-06-08 ENCOUNTER — Encounter: Payer: Self-pay | Admitting: Adult Health

## 2011-06-08 VITALS — BP 106/78 | HR 76 | Temp 97.5°F | Ht 70.0 in | Wt 189.8 lb

## 2011-06-08 DIAGNOSIS — D869 Sarcoidosis, unspecified: Secondary | ICD-10-CM

## 2011-06-08 MED ORDER — FAMOTIDINE 20 MG PO TABS: 20.0000 mg | ORAL_TABLET | Freq: Every evening | ORAL | Status: DC | PRN

## 2011-06-08 MED ORDER — CETIRIZINE HCL 10 MG PO TABS: 10.0000 mg | ORAL_TABLET | Freq: Every day | ORAL | Status: DC

## 2011-06-08 NOTE — Assessment & Plan Note (Signed)
Resolving flare will hold on oral steroids for now  PFT show airflow obstruction w/ FEV1 at 59%  Will begin trial of Symbicort 80 , and have her return for PFTs  Cover for possible triggers of rhinitis and gerd.   Plan  Add Zyrtec 10mg  At bedtime   Add Pepcid 20 mg At bedtime   Begin Symbicort 80/4.28mcg 2 puffs Twice daily   follow up Dr. Sherene Sires  In 4 weeks w/ PFTs  Please contact office for sooner follow up if symptoms do not improve or worsen or seek emergency care

## 2011-06-08 NOTE — Progress Notes (Signed)
Subjective:    Patient ID: Kathleen [name], female    DOB: [birth year], 50 y.o.   MRN: [id number]  HPI 38 yowf never smoker who has a history of sarcoid w/ positive biopsy for noncaseating granulomatous inflammation in 1994. Prev. on chronic steroids off since around 2005 w. follow w/ no active sarcoid. Has hx of atypical chest pain evaluated in November 12, 2005 and felt to be irritable bowel syndrome and GERD.   May 23, 2010 --Presents for an acute office visit. Complains of sinus and chest congestion for 3 days. Also has sore throat and non prod cough. Coughs so hard she near vomits. - Body aches -and hoarseness for last 3 days. She is out of Tramadol. Blowing out green /yellow mucus. OTC not helping. Dx URI-tx w/ zpack.   May 29, 2010 --Presents for work in visit, Complains of persistent symptoms. Seen last week with bronchitic complaints tx w/ zpack. Has had no improvment in cough or congestion. Developed n/v on 05/27/10 w/ subsequent ER visit. Tx w/ IVF and phenergan. XRay with no acute changes. labs showed ? positive udip. repeated today w/ some bacteria. labs were unrevealing. She has not filled her filled phenergan. No further vomitting but no appetitie and lots of nausea. Still has very deep cough. with  green/yellow mucus, hoarseness - states tramadol hasn't helped, finished   October 17, 2010--Presents for an acute office visit. Complains that she is under alot of stress , esp at work is having alot of anxiety and panic attacks.She is very tearful during visit. No suicidal ideations. Has noted increased SOB, wheezing esp at night, dry cough, chills x1week>>steroid taper     11/20/2010  Ov cc cough day and night  X 6 weeks  not producing sputum.  Did not take prednisone and tramadol at the same time. No better at all.  >>zyrtec    05/14/2011 Acute OV  Pt complains of chest congestion and wheezing for several weeks, along with SOB at rest and exertion . Has a lot of rattling with  breathing, chest tightness. Wheezing a lot at night .  Mucus is thick can not get up at times. Has rattling esp at night.  No otc used.  ocsasional heartburn , nexium helps.  >>Augmnetin , and steroids   06/08/2011 Acute OV  Complains of  Dry cough, rattle in throat that is worse at night. Cough but cant get  Anything up. Finshed abx and steroids . Feels better but not over her bronchitic flare last month.  Feels she gets short of breath easily, has to rest with walking at times.  No discolored mucus or fever. No chest pain or edema    Past Medical History:  Sarcoidosis  -positive biopsy for NCG 1994. Prev. on steroids x several years only  Breast fibrocystic-prev. 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  HEENT:   No headaches,  Difficulty swallowing,  Tooth/dental problems, or  Sore throat,                No sneezing, itching, ear ache, nasal  congestion, + post nasal drip,   CV:  No chest pain,  Orthopnea, PND, swelling in lower extremities, anasarca, dizziness, palpitations, syncope.   GI  No  abdominal pain, nausea, vomiting, diarrhea, change in bowel habits, loss of appetite, bloody stools.   Resp:   No coughing up of blood.    No chest wall deformity  Skin: no rash or lesions.  GU: no dysuria, change in color of urine, no urgency or frequency.  No flank pain, no hematuria   MS:  No joint pain or swelling.  No decreased range of motion.  No back pain.  Psych:  No change in mood or affect. No depression or anxiety.  No memory loss.               Objective:   Physical Exam Anxious wf hoarse with barking harsh upper airway pattern cough   wt 188 June 20, 2009 > 187 Jan 10, 2010 >>185 05/23/10>>184 10/4 >193 2/21 > 193 11/20/2010  >>192 06/08/2011  HEENT: nl dentition, , and orophanx. Nl external ear canals without cough reflex, clear nasal drainage, non tender sinus.  NECK : without JVD/Nodes/TM/ nl carotid upstrokes bilaterally  LUNGS coarse BS w/ no wheezing  CV: RRR no s3 or murmur or increase in P2, no edema  ABD: soft and nontender with nl excursion in the supine position. No bruits or organomegaly, bowel sounds nl, no guarding.  MS: warm without deformities, calf tenderness, cyanosis or clubbing       Assessment & Plan:

## 2011-06-08 NOTE — Patient Instructions (Addendum)
Add Zyrtec 10mg  At bedtime   Add Pepcid 20 mg At bedtime   Begin Symbicort 80/4.33mcg 2 puffs Twice daily   follow up Dr. Sherene Sires  In 4 weeks w/ PFTs  Please contact office for sooner follow up if symptoms do not improve or worsen or seek emergency care

## 2011-06-11 LAB — POCT CARDIAC MARKERS
CKMB, poc: <1 — ABNORMAL LOW
Myoglobin, poc: 26.6
Operator id: 189501
Troponin i, poc: <0.05

## 2011-06-11 LAB — BASIC METABOLIC PANEL
BUN: 12
CO2: 25
Calcium: 9.6
Chloride: 106
Creatinine, Ser: 0.67
GFR calc Af Amer: >60
GFR calc non Af Amer: >60
Glucose, Bld: 101 — ABNORMAL HIGH
Potassium: 3.7
Sodium: 137

## 2011-06-11 LAB — DIFFERENTIAL
Basophils Absolute: 0
Basophils Relative: 1
Eosinophils Absolute: 0.2
Eosinophils Relative: 4
Lymphocytes Relative: 27
Lymphs Abs: 1.3
Monocytes Absolute: 0.6
Monocytes Relative: 12 — ABNORMAL HIGH
Neutro Abs: 2.8
Neutrophils Relative %: 56

## 2011-06-11 LAB — CBC
HCT: 33.6 — ABNORMAL LOW
Hemoglobin: 11.8 — ABNORMAL LOW
MCHC: 35
MCV: 82.8
Platelets: 317
RBC: 4.06
RDW: 14.5 — ABNORMAL HIGH
WBC: 4.9

## 2011-07-16 ENCOUNTER — Encounter: Payer: Self-pay | Admitting: Internal Medicine

## 2011-07-16 ENCOUNTER — Encounter: Payer: 59 | Admitting: Internal Medicine

## 2011-07-16 NOTE — Progress Notes (Signed)
Subjective:    Patient ID: Kathleen [name], female    DOB: 1961/05/29, 50 y.o.   MRN: [id number]  HPI 54 yowf never smoker who has a history of sarcoid w/ positive biopsy for noncaseating granulomatous inflammation in 1994. Prev. on chronic steroids off since around 2005 w. follow w/ no active sarcoid. Has hx of atypical chest pain evaluated in November 12, 2005 and felt to be irritable bowel syndrome and GERD.   May 23, 2010 --Presents for an acute office visit. Complains of sinus and chest congestion for 3 days. Also has sore throat and non prod cough. Coughs so hard she near vomits. - Body aches -and hoarseness for last 3 days. She is out of Tramadol. Blowing out green /yellow mucus. OTC not helping. Dx URI-tx w/ zpack.   May 29, 2010 --Presents for work in visit, Complains of persistent symptoms. Seen last week with bronchitic complaints tx w/ zpack. Has had no improvment in cough or congestion. Developed n/v on 05/27/10 w/ subsequent ER visit. Tx w/ IVF and phenergan. XRay with no acute changes. labs showed ? positive udip. repeated today w/ some bacteria. labs were unrevealing. She has not filled her filled phenergan. No further vomitting but no appetitie and lots of nausea. Still has very deep cough. with  green/yellow mucus, hoarseness - states tramadol hasn't helped, finished   October 17, 2010--Presents for an acute office visit. Complains that she is under alot of stress , esp at work is having alot of anxiety and panic attacks.She is very tearful during visit. No suicidal ideations. Has noted increased SOB, wheezing esp at night, dry cough, chills x1week>>steroid taper     11/20/2010  Ov cc cough day and night  X 6 weeks  not producing sputum.  Did not take prednisone and tramadol at the same time. No better at all.  >>zyrtec    05/14/2011 Acute OV  Pt complains of chest congestion and wheezing for several weeks, along with SOB at rest and exertion . Has a lot of rattling with  breathing, chest tightness. Wheezing a lot at night .  Mucus is thick can not get up at times. Has rattling esp at night.  No otc used.  ocsasional heartburn , nexium helps.  >>Augmnetin , and steroids   06/08/2011 Acute OV  Complains of  Dry cough, rattle in throat that is worse at night. Cough but cant get  Anything up. Finshed abx and steroids . Feels better but not over her bronchitic flare last month.  Feels she gets short of breath easily, has to rest with walking at times.  No discolored mucus or fever. No chest pain or edema    Past Medical History:  Sarcoidosis  -positive biopsy for NCG 1994. Prev. on steroids x several years only  Breast fibrocystic-prev. 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 Nl external ear canals without cough reflex,  clear nasal drainage, non tender sinus.  NECK : without JVD/Nodes/TM/ nl carotid upstrokes bilaterally  LUNGS coarse BS w/ no wheezing  CV: RRR no s3 or murmur or increase in P2, no edema  ABD: soft and nontender with nl excursion in the supine position. No bruits or organomegaly, bowel sounds nl, no guarding.  MS: warm without deformities, calf tenderness, cyanosis or clubbing       Assessment & Plan:

## 2011-07-24 ENCOUNTER — Ambulatory Visit: Payer: 59 | Admitting: Internal Medicine

## 2011-08-03 ENCOUNTER — Encounter: Payer: Self-pay | Admitting: Nurse Practitioner

## 2011-08-03 ENCOUNTER — Ambulatory Visit (INDEPENDENT_AMBULATORY_CARE_PROVIDER_SITE_OTHER): Payer: 59 | Admitting: Nurse Practitioner

## 2011-08-03 VITALS — BP 110/70 | Ht 70.0 in | Wt 189.0 lb

## 2011-08-03 DIAGNOSIS — R0602 Shortness of breath: Secondary | ICD-10-CM

## 2011-08-03 DIAGNOSIS — R079 Chest pain, unspecified: Secondary | ICD-10-CM

## 2011-08-03 DIAGNOSIS — R0609 Other forms of dyspnea: Secondary | ICD-10-CM | POA: Insufficient documentation

## 2011-08-03 DIAGNOSIS — R9431 Abnormal electrocardiogram [ECG] [EKG]: Secondary | ICD-10-CM

## 2011-08-03 DIAGNOSIS — R0600 Dyspnea, unspecified: Secondary | ICD-10-CM | POA: Insufficient documentation

## 2011-08-03 NOTE — Assessment & Plan Note (Signed)
She does have an abnormal EKG. She has a positive family history of CAD. This may be more related to the dyspnea. We will arrange for a Lexiscan. I do not think she can walk on a treadmill.

## 2011-08-03 NOTE — Assessment & Plan Note (Signed)
She has had progressive DOE. No overt signs of heart failure. We will also be checking an echo. She is to continue with her pulmonary follow up as well.

## 2011-08-03 NOTE — Patient Instructions (Signed)
We are going to arrange for an ultrasound of your heart and a stress test to evaluate your shortness of breath.  We will see you back to discuss your tests in about 2 weeks.  Call the [facility] office at [phone] if you have any questions, problems or concerns.

## 2011-08-03 NOTE — Progress Notes (Signed)
Kathleen [name] Date of Birth: December 08, 1960 Medical Record #[id number]  History of Present Illness: Kathleen [name] is seen today for a work in visit. She is seen for Dr. Elease [name]. Her last evaluation was back in July of 2011. She presents with worsening DOE over the past several months. It is primarily with activity and rarely with rest. She does not cough. Has some mild swelling of her fingers. No edema in the lower extremities. She feels like her chest is tight when she has the shortness of breath. She will feel a little "fainty" if she does not stop her activity and rest. She is quite frustrated. She is not able to exercise and has trouble just with walking from the parking lot to her office. She does work at [employer] in [employer] She does have a known history of sarcoid. She has been seen recently by pulmonary with cold symptoms. FEV1 was 59% predicted. She will be having full PFT's and a visit with Dr. Sherene [name] later this month. She has been given Symbicort but not sure if it helps. She does not smoke. Her family history is positive for CAD. Multiple members have had CABG.   Current Outpatient Prescriptions on File Prior to Visit  Medication Sig Dispense Refill  . cetirizine (ZYRTEC ALLERGY) 10 MG tablet Take 1 tablet (10 mg total) by mouth daily.      . Diphenhyd-Hydrocort-Nystatin (FIRST-DUKES MOUTHWASH MT) Use as directed 5 mLs in the mouth or throat 4 (four) times daily as needed.        Marland Kitchen esomeprazole (NEXIUM) 40 MG packet Take 40 mg by mouth daily before breakfast.        . famotidine (PEPCID) 20 MG tablet Take 1 tablet (20 mg total) by mouth at bedtime as needed for heartburn.  60 tablet  1  . traMADol (ULTRAM) 50 MG tablet 1-2 every 4-6 hrs as needed         Allergies  Allergen Reactions  . Codeine     REACTION: itch    Past Medical History  Diagnosis Date  . Sarcoidosis   . Breast fibrocystic disorder   . Mitral valve prolapse   . Breast pain     right  . Normal stress  echocardiogram Jan 2011    Past Surgical History  Procedure Date  . Breast surgery   . Tubal ligation     History  Smoking status  . Never Smoker   Smokeless tobacco  . Not on file    History  Alcohol Use No    Family History  Problem Relation Age of Onset  . Cardiomyopathy Mother   . Breast cancer Mother   . Cancer Mother     breast  . Heart disease Maternal Aunt   . Cancer Father     prostate    Review of Systems: The review of systems is positive for occasional numbness of her hands and feet. She is not able to exercise. No syncope.  All other systems were reviewed and are negative.  Physical Exam: BP 110/70  Ht 5\' 10"  (1.778 m)  Wt 189 lb (85.73 kg)  BMI 27.12 kg/m2 Patient is very pleasant and in no acute distress. Skin is warm and dry. Color is normal.  HEENT is unremarkable. Normocephalic/atraumatic. PERRL. Sclera are nonicteric. Neck is supple. No masses. No JVD. Lungs are clear. Cardiac exam shows a regular rate and rhythm. Abdomen is soft. Extremities are without edema. Gait and ROM are intact. No gross neurologic deficits  noted.  LABORATORY DATA:  EKG shows sinus rhythm. She does have inferior T wave inversion. She has had this on prior tracings.    Assessment / Plan:

## 2011-08-08 ENCOUNTER — Ambulatory Visit (HOSPITAL_BASED_OUTPATIENT_CLINIC_OR_DEPARTMENT_OTHER): Payer: 59 | Admitting: Radiology

## 2011-08-08 ENCOUNTER — Ambulatory Visit (HOSPITAL_COMMUNITY): Payer: 59 | Attending: Internal Medicine | Admitting: Radiology

## 2011-08-08 DIAGNOSIS — R0989 Other specified symptoms and signs involving the circulatory and respiratory systems: Secondary | ICD-10-CM | POA: Insufficient documentation

## 2011-08-08 DIAGNOSIS — I059 Rheumatic mitral valve disease, unspecified: Secondary | ICD-10-CM | POA: Insufficient documentation

## 2011-08-08 DIAGNOSIS — R9431 Abnormal electrocardiogram [ECG] [EKG]: Secondary | ICD-10-CM

## 2011-08-08 DIAGNOSIS — R0602 Shortness of breath: Secondary | ICD-10-CM

## 2011-08-08 DIAGNOSIS — Z8249 Family history of ischemic heart disease and other diseases of the circulatory system: Secondary | ICD-10-CM | POA: Insufficient documentation

## 2011-08-08 DIAGNOSIS — R0600 Dyspnea, unspecified: Secondary | ICD-10-CM

## 2011-08-08 DIAGNOSIS — I079 Rheumatic tricuspid valve disease, unspecified: Secondary | ICD-10-CM | POA: Insufficient documentation

## 2011-08-08 DIAGNOSIS — R0609 Other forms of dyspnea: Secondary | ICD-10-CM | POA: Insufficient documentation

## 2011-08-08 MED ORDER — REGADENOSON 0.4 MG/5ML IV SOLN
0.4000 mg | Freq: Once | INTRAVENOUS | Status: AC
  Administered 2011-08-08: 0.4 mg via INTRAVENOUS

## 2011-08-08 MED ORDER — TECHNETIUM TC 99M TETROFOSMIN IV KIT
10.0000 | PACK | Freq: Once | INTRAVENOUS | Status: AC | PRN
  Administered 2011-08-08: 10 via INTRAVENOUS

## 2011-08-08 MED ORDER — TECHNETIUM TC 99M TETROFOSMIN IV KIT
30.0000 | PACK | Freq: Once | INTRAVENOUS | Status: AC | PRN
  Administered 2011-08-08: 30 via INTRAVENOUS

## 2011-08-08 NOTE — Progress Notes (Signed)
[facility] SITE 3 NUCLEAR MED [address] [place] [address] [phone]  Cardiology Nuclear Med Study  Kathleen [name] is a 50 y.o. female [id number] November 18, 1960   Nuclear Med Background Indication for Stress Test:  Evaluation for Ischemia and Abnormal EKG History: 11' Echo Cardiac Risk Factors: Family History - CAD  Symptoms:  Near Syncope, Rapid HR, SOB and fainting with activity   Nuclear Pre-Procedure Caffeine/Decaff Intake:  None NPO After: 9:00pm   Lungs:  clear IV 0.9% NS with Angio Cath:  20g  IV Site: R Antecubital  IV Started by:  Bonnita Levan, RN  Chest Size (in):  38 Cup Size: C  Height: 5\' 10"  (1.778 m)  Weight:  188 lb (85.276 kg)  BMI:  Body mass index is 26.98 kg/(m^2). Tech Comments:  N/A    Nuclear Med Study 1 or 2 day study: 1 day  Stress Test Type:  Lexiscan  Reading MD: Kristeen Miss, MD  Order Authorizing Provider:  Kristeen Miss, MD  Resting Radionuclide: Technetium 101m Tetrofosmin  Resting Radionuclide Dose: 11 mCi   Stress Radionuclide:  Technetium 56m Tetrofosmin  Stress Radionuclide Dose: 32.3 mCi           Stress Protocol Rest HR: 64 Stress HR: 102  Rest BP: 94/64 Stress BP: 105/62  Exercise Time (min): n/a METS: n/a   Predicted Max HR: 172 bpm % Max HR: 57.56 bpm Rate Pressure Product: 21308   Dose of Adenosine (mg):  n/a Dose of Lexiscan: 0.4 mg  Dose of Atropine (mg): n/a Dose of Dobutamine: n/a mcg/kg/min (at max HR)  Stress Test Technologist: Frederick Peers, EMT-P  Nuclear Technologist:  Domenic Polite, CNMT     Rest Procedure:  Myocardial perfusion imaging was performed at rest 45 minutes following the intravenous administration of Technetium 13m Tetrofosmin. Rest ECG: NSR with non-specific ST-T wave changes  Stress Procedure:  The patient received IV Lexiscan 0.4 mg over 15-seconds.  Technetium 93m Tetrofosmin injected at 30-seconds.  There were non specific ST changes with Lexiscan.  Quantitative spect  images were obtained after a 45 minute delay. Stress ECG: Mild worsening of diffuse T-wave inversion with Lexiscan.  QPS Raw Data Images:  RV appears prominent. Signifcant gut uptake overlying inferior wall.  Stress Images:  Normal homogeneous uptake in all areas of the myocardium. Rest Images:  Normal homogeneous uptake in all areas of the myocardium. Subtraction (SDS):  Normal Transient Ischemic Dilatation (Normal <1.22):  .95 Lung/Heart Ratio (Normal <0.45):  .50  Quantitative Gated Spect Images QGS EDV:  69 ml QGS ESV:  24 ml QGS cine images:  NL LV Function; NL Wall Motion QGS EF: 65% Impression Exercise Capacity:  Lexiscan with no exercise. BP Response:  n/a Clinical Symptoms:  n/a ECG Impression:  Mild worsening of diffuse T-wave inversion with Lexiscan Comparison with Prior Nuclear Study: No previous nuclear study performed  Overall Impression:  There is significant gut uptake overlying the inferior wall which lowers the sensitivity of this study but LV perfusion appears normal. LV function is normal. The RV appears dilated and there is significant tracer uptake over the RV suggesting elevated R sided pressures. Would consider echocardiogram to evaluate for pulmonary HTN.   Daniel Bensimhon,MD 7:01 PM

## 2011-08-15 ENCOUNTER — Encounter: Payer: Self-pay | Admitting: Nurse Practitioner

## 2011-08-15 ENCOUNTER — Ambulatory Visit (INDEPENDENT_AMBULATORY_CARE_PROVIDER_SITE_OTHER): Payer: 59 | Admitting: Nurse Practitioner

## 2011-08-15 VITALS — BP 108/70 | HR 86 | Ht 70.0 in | Wt 192.0 lb

## 2011-08-15 DIAGNOSIS — R0600 Dyspnea, unspecified: Secondary | ICD-10-CM

## 2011-08-15 DIAGNOSIS — R0609 Other forms of dyspnea: Secondary | ICD-10-CM

## 2011-08-15 DIAGNOSIS — R0602 Shortness of breath: Secondary | ICD-10-CM

## 2011-08-15 DIAGNOSIS — D869 Sarcoidosis, unspecified: Secondary | ICD-10-CM

## 2011-08-15 NOTE — Assessment & Plan Note (Signed)
I have reviewed her case with Dr. Elease Hashimoto. He feels like her heart is ok. She does drop her sats with walking. She has planned follow up with pulmonary next week. She is to keep that appointment. We will see her back in a couple of months. Patient is agreeable to this plan and will call if any problems develop in the interim.

## 2011-08-15 NOTE — Assessment & Plan Note (Signed)
She does drop her sats with walking her in the office. Has pulmonary follow up planned for next week.

## 2011-08-15 NOTE — Progress Notes (Signed)
   Kathleen [name] Date of Birth: [birth year] Medical Record #[id number]  History of Present Illness: Kathleen [name] is seen today for a follow up visit. She is seen for Dr. Elease Hashimoto. She was here about 2 weeks ago with the complaint of DOE. This has been progressive over the last few months. She was worried about her heart. She does have a chronically abnormal EKG and a positive family history.  Her stress test was felt to be ok. LV function was normal. There was concern about elevated right sided pressures but her echo showed just mildly elevated systolic pressures. I have reviewed these studies with Dr. Elease Hashimoto and he feels like her heart is ok.   She remains short of breath with exertion. It is no better but no worse. She has an appointment with pulmonary next week for PFT's and an OV. She is very frustrated. She is not able to exercise so that she can lose weight.   Current Outpatient Prescriptions on File Prior to Visit  Medication Sig Dispense Refill  . esomeprazole (NEXIUM) 40 MG packet Take 40 mg by mouth daily before breakfast.        . famotidine (PEPCID) 20 MG tablet Take 1 tablet (20 mg total) by mouth at bedtime as needed for heartburn.  60 tablet  1  . traMADol (ULTRAM) 50 MG tablet 1-2 every 4-6 hrs as needed       . DISCONTD: cetirizine (ZYRTEC ALLERGY) 10 MG tablet Take 1 tablet (10 mg total) by mouth daily.        Allergies  Allergen Reactions  . Codeine     REACTION: itch    Past Medical History  Diagnosis Date  . Sarcoidosis   . Breast fibrocystic disorder   . Mitral valve prolapse   . Breast pain     right  . Normal stress echocardiogram Jan 2011  . Normal nuclear stress test December 2012  . Normal echocardiogram December 2012    EF 55 to 60%; mild MVP, grade 1 diastolic dysfunction, PAP 32    Past Surgical History  Procedure Date  . Breast surgery   . Tubal ligation     History  Smoking status  . Never Smoker   Smokeless tobacco  . Not on file    History    Alcohol Use No    Family History  Problem Relation Age of Onset  . Cardiomyopathy Mother   . Breast cancer Mother   . Cancer Mother     breast  . Heart disease Maternal Aunt   . Cancer Father     prostate    Review of Systems: The review of systems is positive for DOE.  All other systems were reviewed and are negative.  Physical Exam: BP 108/70  Pulse 86  Ht 5\' 10"  (1.778 m)  Wt 192 lb (87.091 kg)  BMI 27.55 kg/m2 Oxygen sat at rest was 96%. With walking in the office, she dropped to 88% just briefly, then back up to the 90's.  Patient is very pleasant and in no acute distress. Skin is warm and dry. Color is normal.  HEENT is unremarkable. Normocephalic/atraumatic. PERRL. Sclera are nonicteric. Neck is supple. No masses. No JVD. Lungs are clear. Cardiac exam shows a regular rate and rhythm. Abdomen is soft. Extremities are without edema. Gait and ROM are intact. No gross neurologic deficits noted.   LABORATORY DATA: Myoview and Echo as noted above.    Assessment / Plan:

## 2011-08-15 NOTE — Patient Instructions (Signed)
Stay on your current medicines.  Keep your appointment with Dr. Sherene Sires next week for your PFT's.  We will see you back in 2 months.  Call the [facility] office at [phone] if you have any questions, problems or concerns.

## 2011-08-24 ENCOUNTER — Telehealth: Payer: Self-pay | Admitting: Internal Medicine

## 2011-08-24 ENCOUNTER — Other Ambulatory Visit (INDEPENDENT_AMBULATORY_CARE_PROVIDER_SITE_OTHER): Payer: 59

## 2011-08-24 ENCOUNTER — Ambulatory Visit (INDEPENDENT_AMBULATORY_CARE_PROVIDER_SITE_OTHER): Payer: 59 | Admitting: Internal Medicine

## 2011-08-24 ENCOUNTER — Encounter: Payer: Self-pay | Admitting: Internal Medicine

## 2011-08-24 ENCOUNTER — Telehealth: Payer: Self-pay | Admitting: General Practice

## 2011-08-24 ENCOUNTER — Ambulatory Visit (INDEPENDENT_AMBULATORY_CARE_PROVIDER_SITE_OTHER)
Admission: RE | Admit: 2011-08-24 | Discharge: 2011-08-24 | Disposition: A | Discharge status: Home / Self Care | Payer: 59 | Source: Ambulatory Visit | Attending: Internal Medicine | Admitting: Internal Medicine

## 2011-08-24 VITALS — BP 122/72 | HR 57 | Temp 97.7°F | Ht 70.0 in | Wt 191.0 lb

## 2011-08-24 DIAGNOSIS — R0989 Other specified symptoms and signs involving the circulatory and respiratory systems: Secondary | ICD-10-CM

## 2011-08-24 DIAGNOSIS — R0600 Dyspnea, unspecified: Secondary | ICD-10-CM

## 2011-08-24 DIAGNOSIS — R05 Cough: Secondary | ICD-10-CM

## 2011-08-24 DIAGNOSIS — D869 Sarcoidosis, unspecified: Secondary | ICD-10-CM

## 2011-08-24 DIAGNOSIS — R0609 Other forms of dyspnea: Secondary | ICD-10-CM

## 2011-08-24 DIAGNOSIS — R059 Cough, unspecified: Secondary | ICD-10-CM

## 2011-08-24 LAB — BRAIN NATRIURETIC PEPTIDE: Pro B Natriuretic peptide (BNP): 8 pg/mL (ref 0.0–100.0)

## 2011-08-24 LAB — CBC WITH DIFFERENTIAL/PLATELET
Basophils Absolute: 0 10*3/uL (ref 0.0–0.1)
Basophils Relative: 0.4 % (ref 0.0–3.0)
Eosinophils Absolute: 0.1 10*3/uL (ref 0.0–0.7)
Eosinophils Relative: 2.4 % (ref 0.0–5.0)
HCT: 36.1 % (ref 36.0–46.0)
Hemoglobin: 12.4 g/dL (ref 12.0–15.0)
Lymphocytes Relative: 25.6 % (ref 12.0–46.0)
Lymphs Abs: 1.4 10*3/uL (ref 0.7–4.0)
MCHC: 34.3 g/dL (ref 30.0–36.0)
MCV: 82.2 fl (ref 78.0–100.0)
Monocytes Absolute: 0.5 10*3/uL (ref 0.1–1.0)
Monocytes Relative: 10.1 % (ref 3.0–12.0)
Neutro Abs: 3.3 10*3/uL (ref 1.4–7.7)
Neutrophils Relative %: 61.5 % (ref 43.0–77.0)
Platelets: 301 10*3/uL (ref 150.0–400.0)
RBC: 4.39 Mil/uL (ref 3.87–5.11)
RDW: 15.5 % — ABNORMAL HIGH (ref 11.5–14.6)
WBC: 5.4 10*3/uL (ref 4.5–10.5)

## 2011-08-24 LAB — BASIC METABOLIC PANEL
BUN: 12 mg/dL (ref 6–23)
CO2: 24 mEq/L (ref 19–32)
Calcium: 9.4 mg/dL (ref 8.4–10.5)
Chloride: 105 mEq/L (ref 96–112)
Creatinine, Ser: 0.8 mg/dL (ref 0.4–1.2)
GFR: 76.63 mL/min (ref >60.00)
Glucose, Bld: 89 mg/dL (ref 70–99)
Potassium: 3.9 mEq/L (ref 3.5–5.1)
Sodium: 137 mEq/L (ref 135–145)

## 2011-08-24 LAB — PULMONARY FUNCTION TEST

## 2011-08-24 MED ORDER — IOHEXOL 350 MG/ML SOLN: 75.0000 mL | Freq: Once | INTRAVENOUS | Status: AC | PRN

## 2011-08-24 MED ORDER — ALPRAZOLAM 0.5 MG PO TABS: 0.5000 mg | ORAL_TABLET | Freq: Three times a day (TID) | ORAL | Status: DC | PRN

## 2011-08-24 NOTE — Telephone Encounter (Signed)
Pt called back. Says that libby had offered an appt today (for a test). Pt says she would rather do this today instead of Monday if it's possible. Call [phone]. Kathleen [name]

## 2011-08-24 NOTE — Progress Notes (Signed)
Quick Note:  Spoke with pt and notified of results per Dr. Wert. Pt verbalized understanding and denied any questions.  ______ 

## 2011-08-24 NOTE — Progress Notes (Signed)
PFT was done today.  

## 2011-08-24 NOTE — Patient Instructions (Signed)
Stop symbicort  Try taking xanax 0.5 mg one half to one  every 8 hours as needed for nerves  Continue the nexium 40 mg Take 30-60 min before first meal of the day and pepcid at bedtime  Please remember to go to the lab and x-ray department downstairs for your tests - we will call you with the results when they are available.    Please see patient coordinator before you leave today  to schedule a ct chest to be done Monday Dec 31 so I have time to review your cxr and labs first

## 2011-08-24 NOTE — Telephone Encounter (Signed)
Pt has spoken to rose in [facility] ct and she will be doing her chest ct angio today

## 2011-08-24 NOTE — Telephone Encounter (Signed)
This is a duplicate message.  Will sign off. 

## 2011-08-24 NOTE — Progress Notes (Signed)
Subjective:    Patient ID: Kathleen [name], female    DOB: 06/16/1961, 50 y.o.   MRN: [id number]  HPI 29 yowf never smoker who has a history of sarcoid w/ positive biopsy for noncaseating granulomatous inflammation in 1994. Prev. on chronic steroids off since around 2005 w. w/ no convincing active sarcoid. Has hx of atypical chest pain evaluated in November 12, 2005 and felt to be irritable bowel syndrome and GERD.     05/14/2011 Acute OV NP Cc chest congestion and wheezing for several weeks, along with SOB at rest and exertion . Has a lot of rattling with breathing, chest tightness. Wheezing a lot at night .  Mucus is thick can not get up at times. Has rattling esp at night.  No otc used.  occasional heartburn , nexium helps.  >>Augmentin , and steroids   06/08/2011 Acute OV/NP cc  Dry cough, rattle in throat that is worse at night. Cough but can't get  Anything up. Finshed abx and steroids . Feels better but not over her bronchitic flare last month.  Feels she gets short of breath easily, has to rest with walking at times.  No discolored mucus or fever. No chest pain or edema  rec Add Zyrtec 10mg  At bedtime   Add Pepcid 20 mg At bedtime   Begin Symbicort 80/4.55mcg 2 puffs Twice daily   follow up Dr. Sherene [name]  In 4 weeks w/ PFTs    08/24/2011 f/u ov/Kathleen [name] cc progressively worse doe x months, indolent onset, cough better with gerd rx. Has trouble getting across parking lot now at work where this didn't used to be the case. No better on symbicort. No obst on pft's  Sleeping ok without nocturnal  or early am exacerbation  of respiratory  c/o's or need for noct saba. Also denies any obvious fluctuation of symptoms with weather or environmental changes or other aggravating or alleviating factors except as outlined above   ROS  At present neg for  any significant sore throat, dysphagia, itching, sneezing,  nasal congestion or excess/ purulent secretions,  fever, chills, sweats, unintended wt loss,  pleuritic or exertional cp, hempoptysis, orthopnea pnd or leg swelling.  Also denies presyncope, palpitations, heartburn, abdominal pain, nausea, vomiting, diarrhea  or change in bowel or urinary habits, dysuria,hematuria,  rash, arthralgias, visual complaints, headache, numbness weakness or ataxia.         Past Medical History:  Sarcoidosis  -positive biopsy for NCG 1994 rx steroids x several years only  Breast fibrocystic-prev. 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 Nl external ear canals without cough reflex, clear nasal drainage, non tender sinus.  NECK : without JVD/Nodes/TM/ nl carotid upstrokes bilaterally  LUNGS coarse BS w/ no wheezing  CV: RRR no s3 or murmur or increase in P2, no edema  ABD: soft and nontender with nl excursion in the supine position. No bruits or organomegaly, bowel sounds nl, no guarding.  MS: warm without deformities, calf  tenderness, cyanosis or clubbing     CXR  08/24/2011 :  Stable architectural distortion and bilateral upper lobe and perihilar scarring with volume loss consistent with chronic sarcoid. No definite superimposed infiltrates.   CTa chest 08/24/11 Sarcoid changes mostly upper lobe, no pe    Assessment & Plan:

## 2011-08-25 NOTE — Assessment & Plan Note (Signed)
PFTs c/w restriction, not obst, and no better on symbicort with low dlco, ex desat and ct c/w sarcoid though more upper lobe.  Anxiety may limit use of empiric steroids so rx with max gerd rx then regroup after holidays and consider steroid trial

## 2011-08-25 NOTE — Assessment & Plan Note (Signed)
No evidence of cardiac cause specifically PAH though she is at risk

## 2011-08-27 ENCOUNTER — Telehealth: Payer: Self-pay | Admitting: Internal Medicine

## 2011-08-27 ENCOUNTER — Other Ambulatory Visit: Payer: 59

## 2011-08-27 NOTE — Telephone Encounter (Signed)
LMTCB

## 2011-08-27 NOTE — Telephone Encounter (Signed)
Result Note     Call patient : Study is unremarkable, no change in recs - just shows old sarcoid changes     Pt returned call.  Advised of CT results / recs as stated by MW.  Pt then stated that she would like recs from MW - her hands and feet still get numb and she is still having DOE.  I asked pt if the xanax given at ov help at all with her SOB, and pt informed me that she is due to pick up this rx today.  Pt requesting recs and call back today.  Dr Sherene Sires please advise, thanks!

## 2011-08-29 ENCOUNTER — Telehealth: Payer: Self-pay | Admitting: *Deleted

## 2011-08-29 NOTE — Telephone Encounter (Signed)
Spoke with pt and scheduled appt with MW for 08/31/11 at 9 am.

## 2011-08-29 NOTE — Telephone Encounter (Signed)
Message copied by Christen Butter on Wed Aug 29, 2011 12:06 PM ------      Message from: Nyoka Cowden      Created: Sat Aug 25, 2011  9:44 AM       Needs ov p holidays to regroup re longterm rx for doe but she should definitely follow the recs I made for now

## 2011-08-30 NOTE — Telephone Encounter (Signed)
Returned call > f/u 1/4

## 2011-08-31 ENCOUNTER — Encounter: Payer: Self-pay | Admitting: Internal Medicine

## 2011-08-31 ENCOUNTER — Ambulatory Visit (INDEPENDENT_AMBULATORY_CARE_PROVIDER_SITE_OTHER): Payer: 59 | Admitting: Internal Medicine

## 2011-08-31 ENCOUNTER — Other Ambulatory Visit (INDEPENDENT_AMBULATORY_CARE_PROVIDER_SITE_OTHER): Payer: 59

## 2011-08-31 DIAGNOSIS — R0600 Dyspnea, unspecified: Secondary | ICD-10-CM

## 2011-08-31 DIAGNOSIS — F411 Generalized anxiety disorder: Secondary | ICD-10-CM

## 2011-08-31 DIAGNOSIS — R0989 Other specified symptoms and signs involving the circulatory and respiratory systems: Secondary | ICD-10-CM

## 2011-08-31 DIAGNOSIS — R0609 Other forms of dyspnea: Secondary | ICD-10-CM

## 2011-08-31 DIAGNOSIS — D869 Sarcoidosis, unspecified: Secondary | ICD-10-CM

## 2011-08-31 LAB — TSH: TSH: 3.79 u[IU]/mL (ref 0.35–5.50)

## 2011-08-31 LAB — SEDIMENTATION RATE: Sed Rate: 30 mm/hr — ABNORMAL HIGH (ref 0–22)

## 2011-08-31 MED ORDER — PROPRANOLOL HCL ER 80 MG PO CP24: 80.0000 mg | ORAL_CAPSULE | Freq: Every day | ORAL | Status: DC

## 2011-08-31 NOTE — Progress Notes (Signed)
Patient ID: Kathleen [name], female   DOB: 1961/03/25, 51 y.o.   MRN: [id number]  Subjective:     HPI 50 yowf never smoker who has a history of sarcoid w/ positive biopsy for noncaseating granulomatous inflammation in 1994. Prev. on chronic steroids off since around 2005 w. w/ no convincing active sarcoid. Has hx of atypical chest pain evaluated in November 12, 2005 and felt to be irritable bowel syndrome and GERD.     05/14/2011 Acute OV NP Cc chest congestion and wheezing for several weeks, along with SOB at rest and exertion . Has a lot of rattling with breathing, chest tightness. Wheezing a lot at night .  Mucus is thick can not get up at times. Has rattling esp at night.  No otc used.  occasional heartburn , nexium helps.  >>Augmentin , and steroids   06/08/2011 Acute OV/NP cc  Dry cough, rattle in throat that is worse at night. Cough but can't get  Anything up. Finshed abx and steroids . Feels better but not over her bronchitic flare last month.  Feels she gets short of breath easily, has to rest with walking at times.  No discolored mucus or fever. No chest pain or edema  rec Add Zyrtec 10mg  At bedtime   Add Pepcid 20 mg At bedtime   Begin Symbicort 80/4.39mcg 2 puffs Twice daily   follow up Dr. Sherene [name]  In 4 weeks w/ PFTs    08/24/2011 f/u ov/Kathleen [name] cc progressively worse doe x months, indolent onset, cough better with gerd rx. Has trouble getting across parking lot now at work where this didn't used to be the case. No better on symbicort. No obst on pft's rec Stop symbicort Try taking xanax 0.5 mg one half to one  every 8 hours as needed for nerves Continue the nexium 40 mg Take 30-60 min before first meal of the day and pepcid at bedtime CT chest > sarcoid changes only, chronic appearing   08/31/2011 f/u ov/Kathleen [name] cough gone cc no change doe, no cough or rash or ocular/articular complaints, no worse off symbicort, some better on xanax when she takes it, which is not often.   Sleeping  ok without nocturnal  or early am exacerbation  of respiratory  c/o's or need for noct saba. Also denies any obvious fluctuation of symptoms with weather or environmental changes or other aggravating or alleviating factors except as outlined above   ROS  At present neg for  any significant sore throat, dysphagia, itching, sneezing,  nasal congestion or excess/ purulent secretions,  fever, chills, sweats, unintended wt loss, pleuritic or exertional cp, hempoptysis, orthopnea pnd or leg swelling.  Also denies presyncope, palpitations, heartburn, abdominal pain, nausea, vomiting, diarrhea  or change in bowel or urinary habits, dysuria,hematuria,  rash, arthralgias, visual complaints, headache, numbness weakness or ataxia.         Past Medical History:  Sarcoidosis  -positive biopsy for NCG 1994 rx steroids x several years only  Breast fibrocystic-prev. 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 Nl external ear canals without cough reflex, clear nasal drainage, non tender sinus.  NECK : without JVD/Nodes/TM/ nl carotid upstrokes bilaterally  LUNGS coarse BS w/ no wheezing  CV: RRR no s3 or murmur or increase in P2, no edema  ABD: soft and nontender with nl excursion in the supine position. No bruits or organomegaly, bowel sounds nl, no guarding.  MS: warm without deformities, calf tenderness, cyanosis or clubbing     CXR  08/24/2011 :  Stable architectural distortion and bilateral upper lobe and perihilar scarring with volume loss consistent with chronic sarcoid. No  definite superimposed infiltrates.   CTa chest 08/24/11 Sarcoid changes mostly upper lobe, no pe    Assessment & Plan:

## 2011-08-31 NOTE — Patient Instructions (Signed)
Please remember to go to the lab department  downstairs for your tests - we will call you with the results when they are available.  Start Propranolol 80 mg one daily and use xanax as needed for breathing attacks at rest  Please schedule a follow up office visit in 4 weeks, sooner if needed

## 2011-09-01 LAB — ANGIOTENSIN CONVERTING ENZYME: Angiotensin-Converting Enzyme: 92 U/L — ABNORMAL HIGH (ref 8–52)

## 2011-09-02 NOTE — Assessment & Plan Note (Signed)
Trial of propranolol started 08/31/11 for assoc tachycardia with nl tsh

## 2011-09-02 NOTE — Assessment & Plan Note (Addendum)
09/07/10 PFTs  FEV1 2.41  (59%), ratio 79>> Symbicort 80/4.32mcg 2 puffs Twice daily > no improvement 08/24/2011 PFTs FEV1  1.92 (62%) ratio 79 and no better p B2,  DLC0 35 corrects to 78% > d/c symbicort 08/24/2011   Walked RA x one lap @ 185 stopped due to  desat to 85% CTangiogram of chest  08/24/11 c/w sarcoid only but upper lobe predominant ECHO 2d 08/08/11 PAS only 32 Repeat walking sats ok x one lap 08/31/11 I had an extended discussion with the patient today lasting 15 to 20 minutes of a 25 minute visit on the following issues:  No evidence of PE or PAH but she still may have active sarcoid explaining her recent desats with ex - she is better objectively with rx of gerd though so reasonable to continue this rx for now and reserve steroid trial for next ov if not improving ex tol with pacing and propranolol  See instructions for specific recommendations which were reviewed directly with the patient who was given a copy with highlighter outlining the key components.

## 2011-09-03 NOTE — Progress Notes (Signed)
Quick Note:  Spoke with pt and notified of results per Dr. Wert. Pt verbalized understanding and denied any questions.  ______ 

## 2011-10-01 ENCOUNTER — Encounter: Payer: 59 | Admitting: Internal Medicine

## 2011-10-01 NOTE — Progress Notes (Signed)
Patient ID: Kathleen [name], female   DOB: 1961/03/24, 51 y.o.   MRN: [id number]  Subjective:     HPI 50 yowf never smoker who has a history of sarcoid w/ positive biopsy for noncaseating granulomatous inflammation in 1994. Prev. on chronic steroids off since around 2005 w. w/ no convincing active sarcoid. Has hx of atypical chest pain evaluated in November 12, 2005 and felt to be irritable bowel syndrome and GERD.     05/14/2011 Acute OV NP Cc chest congestion and wheezing for several weeks, along with SOB at rest and exertion . Has a lot of rattling with breathing, chest tightness. Wheezing a lot at night .  Mucus is thick can not get up at times. Has rattling esp at night.  No otc used.  occasional heartburn , nexium helps.  >>Augmentin , and steroids   06/08/2011 Acute OV/NP cc  Dry cough, rattle in throat that is worse at night. Cough but can't get  Anything up. Finshed abx and steroids . Feels better but not over her bronchitic flare last month.  Feels she gets short of breath easily, has to rest with walking at times.  No discolored mucus or fever. No chest pain or edema  rec Add Zyrtec 10mg  At bedtime   Add Pepcid 20 mg At bedtime   Begin Symbicort 80/4.9mcg 2 puffs Twice daily   follow up Dr. Sherene Sires  In 4 weeks w/ PFTs    08/24/2011 f/u ov/Wert cc progressively worse doe x months, indolent onset, cough better with gerd rx. Has trouble getting across parking lot now at work where this didn't used to be the case. No better on symbicort. No obst on pft's rec Stop symbicort Try taking xanax 0.5 mg one half to one  every 8 hours as needed for nerves Continue the nexium 40 mg Take 30-60 min before first meal of the day and pepcid at bedtime CT chest > sarcoid changes only, chronic appearing   08/31/2011 f/u ov/Wert cough gone cc no change doe, no cough or rash or ocular/articular complaints, no worse off symbicort, some better on xanax when she takes it, which is not often. rec Please  remember to go to the lab department  downstairs for your tests - we will call you with the results when they are available. Start Propranolol 80 mg one daily and use xanax as needed for breathing attacks at rest    10/01/2011 f/u ov/Wert cc  Sleeping ok without nocturnal  or early am exacerbation  of respiratory  c/o's or need for noct saba. Also denies any obvious fluctuation of symptoms with weather or environmental changes or other aggravating or alleviating factors except as outlined above   ROS  At present neg for  any significant sore throat, dysphagia, itching, sneezing,  nasal congestion or excess/ purulent secretions,  fever, chills, sweats, unintended wt loss, pleuritic or exertional cp, hempoptysis, orthopnea pnd or leg swelling.  Also denies presyncope, palpitations, heartburn, abdominal pain, nausea, vomiting, diarrhea  or change in bowel or urinary habits, dysuria,hematuria,  rash, arthralgias, visual complaints, headache, numbness weakness or ataxia.         Past Medical History:  Sarcoidosis  -positive biopsy for NCG 1994 rx steroids x several years only  Breast fibrocystic-prev. 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 Nl external ear canals without cough reflex, clear nasal drainage, non tender sinus.  NECK : without JVD/Nodes/TM/ nl carotid upstrokes bilaterally  LUNGS coarse BS w/ no wheezing  CV: RRR no s3 or murmur or increase in P2, no edema  ABD: soft and nontender with nl excursion in the supine position. No bruits or  organomegaly, bowel sounds nl, no guarding.  MS: warm without deformities, calf tenderness, cyanosis or clubbing     CXR  08/24/2011 :  Stable architectural distortion and bilateral upper lobe and perihilar scarring with volume loss consistent with chronic sarcoid. No definite superimposed infiltrates.   CTa chest 08/24/11 Sarcoid changes mostly upper lobe, no pe    Assessment & Plan:

## 2011-10-15 ENCOUNTER — Ambulatory Visit (INDEPENDENT_AMBULATORY_CARE_PROVIDER_SITE_OTHER): Payer: 59 | Admitting: Internal Medicine

## 2011-10-15 ENCOUNTER — Encounter: Payer: Self-pay | Admitting: Internal Medicine

## 2011-10-15 DIAGNOSIS — D869 Sarcoidosis, unspecified: Secondary | ICD-10-CM

## 2011-10-15 DIAGNOSIS — R0989 Other specified symptoms and signs involving the circulatory and respiratory systems: Secondary | ICD-10-CM

## 2011-10-15 DIAGNOSIS — R0609 Other forms of dyspnea: Secondary | ICD-10-CM

## 2011-10-15 DIAGNOSIS — R0600 Dyspnea, unspecified: Secondary | ICD-10-CM

## 2011-10-15 MED ORDER — PROPRANOLOL HCL ER 80 MG PO CP24: ORAL_CAPSULE | ORAL | Status: DC

## 2011-10-15 MED ORDER — FAMOTIDINE 10 MG PO TABS: ORAL_TABLET | ORAL | Status: DC

## 2011-10-15 NOTE — Assessment & Plan Note (Signed)
Echo ok 08/08/11 and sats ok today walking so strongly suspect vcd / obesity deconditioning here  Discussed in detail all the  indications, usual  risks and alternatives  relative to the benefits with patient who agrees to proceed with formal cpst.

## 2011-10-15 NOTE — Progress Notes (Signed)
Patient ID: Kathleen [name], female   DOB: 1961-02-08 .   MRN: [id number]  Subjective:     Brief patient profile:  51 yowf never smoker who has a history of sarcoid w/ positive biopsy for noncaseating granulomatous inflammation in 1994. Prev. on chronic steroids off since around 2005 w. w/ no convincing active sarcoid. Has hx of atypical chest pain evaluated in November 12, 2005 and felt to be irritable bowel syndrome and GERD.     05/14/2011 Acute OV NP Cc chest congestion and wheezing for several weeks, along with SOB at rest and exertion . Has a lot of rattling with breathing, chest tightness. Wheezing a lot at night .  Mucus is thick can not get up at times. Has rattling esp at night.  No otc used.  occasional heartburn , nexium helps.  >>Augmentin , and steroids   06/08/2011 Acute OV/NP cc  Dry cough, rattle in throat that is worse at night. Cough but can't get  Anything up. Finshed abx and steroids . Feels better but not over her bronchitic flare last month.  Feels she gets short of breath easily, has to rest with walking at times.  No discolored mucus or fever. No chest pain or edema  rec Add Zyrtec 10mg  At bedtime   Add Pepcid 20 mg At bedtime   Begin Symbicort 80/4.48mcg 2 puffs Twice daily   follow up Dr. Sherene [name]  In 4 weeks w/ PFTs    08/24/2011 f/u ov/Kathleen [name] cc progressively worse doe x months, indolent onset, cough better with gerd rx. Has trouble getting across parking lot now at work where this didn't used to be the case. No better on symbicort. No obst on pft's rec Stop symbicort Try taking xanax 0.5 mg one half to one  every 8 hours as needed for nerves Continue the nexium 40 mg Take 30-60 min before first meal of the day and pepcid at bedtime CT chest > sarcoid changes only, chronic appearing   08/31/2011 f/u ov/Kathleen [name] cough gone cc no change doe, no cough or rash or ocular/articular complaints, no worse off symbicort, some better on xanax when she takes it, which is not  often. rec Please remember to go to the lab department  downstairs for your tests - we will call you with the results when they are available. Start Propranolol 80 mg one daily and use xanax as needed for breathing attacks at rest    10/15/2011 f/u ov/Kathleen [name] cc sob "all the time"  =  Doe x across the parking lot, also room to room exactly the same problem, not really proportional to activity,  not taking propranolol - tramadol helps urge to cough.  Sleeping ok without nocturnal  or early am exacerbation  of respiratory  c/o's or need for noct saba. Also denies any obvious fluctuation of symptoms with weather or environmental changes or other aggravating or alleviating factors except as outlined above   ROS  At present neg for  any significant sore throat, dysphagia, itching, sneezing,  nasal congestion or excess/ purulent secretions,  fever, chills, sweats, unintended wt loss, pleuritic or exertional cp, hempoptysis, orthopnea pnd or leg swelling.  Also denies presyncope, palpitations, heartburn, abdominal pain, nausea, vomiting, diarrhea  or change in bowel or urinary habits, dysuria,hematuria,  rash, arthralgias, visual complaints, headache, numbness weakness or ataxia.         Past Medical History:  Sarcoidosis  -positive biopsy for NCG 1994 rx steroids x several years only  Breast fibrocystic-prev. 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 Nl external ear canals without cough reflex, clear nasal drainage, non  tender sinus.  NECK : without JVD/Nodes/TM/ nl carotid upstrokes bilaterally  LUNGS coarse BS w/ no wheezing  CV: RRR no s3 or murmur or increase in P2, no edema  ABD: soft and nontender with nl excursion in the supine position. No bruits or organomegaly, bowel sounds nl, no guarding.  MS: warm without deformities, calf tenderness, cyanosis or clubbing     CXR  08/24/2011 :  Stable architectural distortion and bilateral upper lobe and perihilar scarring with volume loss consistent with chronic sarcoid. No definite superimposed infiltrates.   CTa chest 08/24/11 Sarcoid changes mostly upper lobe, no pe    Assessment & Plan:

## 2011-10-15 NOTE — Patient Instructions (Signed)
Take delsym two tsp every 12 hours and supplement if needed with  tramadol 50 mg up to 2 every 4 hours to suppress the urge to cough. Swallowing water or using ice chips/non mint and menthol containing candies (such as lifesavers or sugarless jolly ranchers) are also effective.  You should rest your voice and avoid activities that you know make you cough.  Once you have eliminated the cough for 3 straight days try reducing the tramadol first,  then the delsym as tolerated.    Propranolol 80 mg one half daily  Please see patient coordinator before you leave today  to schedule cpst

## 2011-10-15 NOTE — Assessment & Plan Note (Signed)
09/07/10  FEV1 2.41  (59%), ratio 79>> Symbicort 80/4.22mcg 2 puffs Twice daily > no improvement PFT's 08/24/2011 FEV1  1.92 (62%) ratio 79 and no better p B2,  DLC0 35 corrects to 78% > d/c symbicort 08/24/2011   Walked RA x one lap @ 185 stopped due to  desat to 85% CTangiogram of chest  08/24/11 c/w sarcoid only but upper lobe predominant ECHO 2d 08/08/11 PAS only 32 Repeat walking sats ok x one lap 08/31/11 ACE 90 08/31/11  Since predominantly upper lobe and not desaturating don't feel sarcoid is the cause for her sob > rec cpst

## 2011-10-16 ENCOUNTER — Ambulatory Visit: Payer: 59 | Admitting: Cardiovascular Disease

## 2011-10-22 ENCOUNTER — Encounter: Payer: Self-pay | Admitting: Cardiovascular Disease

## 2011-10-22 ENCOUNTER — Ambulatory Visit (INDEPENDENT_AMBULATORY_CARE_PROVIDER_SITE_OTHER): Payer: 59 | Admitting: Cardiovascular Disease

## 2011-10-22 VITALS — BP 102/70 | HR 63 | Ht 70.5 in | Wt 191.8 lb

## 2011-10-22 DIAGNOSIS — R0609 Other forms of dyspnea: Secondary | ICD-10-CM

## 2011-10-22 DIAGNOSIS — R0600 Dyspnea, unspecified: Secondary | ICD-10-CM

## 2011-10-22 DIAGNOSIS — R0989 Other specified symptoms and signs involving the circulatory and respiratory systems: Secondary | ICD-10-CM

## 2011-10-22 NOTE — Progress Notes (Signed)
Kathleen [name] Date of Birth  24-Jul-1961 [facility] Office  [address]    [address]   [address] [place], [place]  [address]    [place], [place]  [address] [phone]  Fax  [phone]  [phone]  Fax [phone]  Problem list: 1. History of chest pain- negative stress echocardiogram in January, 2011. She walked for a total of 9 minutes on an advanced Bruce protocol. She achieved a peak heart rate 181 which was 99% of her predicted maximal heart rate. She had no evidence of ischemia by EKG or by echo. She has normal left ventricular systolic function with an EF of 55-60%. 2. Sarcoidosis 3. Gastroesophageal reflux 4. Fatigue   History of Present Illness:  She has continued to have severe dyspnea with any exertion.  Lexiscan myoview was normal except for some right ventricular uptake. A subsequent echocardiogram revealed normal left ventricular systolic function and no significant RV enlargement.  Her pulmonary pressures were mildly elevated. She had a CT angiogram which was negative for pulmonary embolus. It did show an old sarcoidosis.  She does continue to have lots of shortness breath particularly with exertion. She is scheduled to have a cardiopulmonary stress test in March.  Current Outpatient Prescriptions on File Prior to Visit  Medication Sig Dispense Refill  . ALPRAZolam (XANAX) 0.5 MG tablet 1/2-1 tablet by mouth three times a day as needed for anxiety      . esomeprazole (NEXIUM) 40 MG packet Take 40 mg by mouth daily before breakfast.        . famotidine (PEPCID) 10 MG tablet 2 at bedtime      . ibuprofen (ADVIL,MOTRIN) 200 MG tablet Take 200 mg by mouth every 6 (six) hours as needed.        . propranolol (INDERAL LA) 80 MG 24 hr capsule One half each am  30 capsule  11  . traMADol (ULTRAM) 50 MG tablet 1-2 every 4-6 hrs as needed         Allergies  Allergen Reactions  . Codeine     REACTION: itch    Past Medical History    Diagnosis Date  . Sarcoidosis   . Breast fibrocystic disorder   . Mitral valve prolapse   . Breast pain     right  . Normal stress echocardiogram Jan 2011  . Normal nuclear stress test December 2012  . Normal echocardiogram December 2012    EF 55 to 60%; mild MVP, grade 1 diastolic dysfunction, PAP 32    Past Surgical History  Procedure Date  . Breast surgery   . Tubal ligation     History  Smoking status  . Never Smoker   Smokeless tobacco  . Not on file    History  Alcohol Use No    Family History  Problem Relation Age of Onset  . Cardiomyopathy Mother   . Breast cancer Mother   . Cancer Mother     breast  . Heart disease Maternal Aunt   . Cancer Father     prostate    Reviw of Systems:  Reviewed in the HPI.  All other systems are negative.  Physical Exam: Blood pressure 102/70, pulse 63, height 5' 10.5" (1.791 m), weight 191 lb 12.8 oz (87 kg). General: Well developed, well nourished, in no acute distress.  Head: Normocephalic, atraumatic, sclera non-icteric, mucus membranes are moist,   Neck: Supple. Carotids are 2 + without bruits. No  JVD  Lungs: Clear bilaterally to auscultation.  Heart: regular rate  With normal  S1 S2. No murmurs, gallops or rubs.  Abdomen: Soft, non-tender, non-distended with normal bowel sounds. No hepatomegaly. No rebound/guarding. No masses.  Msk:  Strength and tone are normal  Extremities: No clubbing or cyanosis. No edema.  Distal pedal pulses are 2+ and equal bilaterally.  Neuro: Alert and oriented X 3. Moves all extremities spontaneously.  Psych:  Responds to questions appropriately with a normal affect.  ECG:  Assessment / Plan:

## 2011-10-22 NOTE — Patient Instructions (Signed)
Your physician wants you to follow-up in: 6 months  You will receive a reminder letter in the mail two months in advance. If you don't receive a letter, please call our office to schedule the follow-up appointment.  Your physician recommends that you continue on your current medications as directed. Please refer to the Current Medication list given to you today.  

## 2011-10-22 NOTE — Assessment & Plan Note (Signed)
Kathleen [name] continues to have some shortness of breath. She has known sarcoidosis. She has minimally elevated pulmonary pressures. There is no evidence of coronary artery disease or ischemia.  She is scheduled to have a cardiopulmonary stress test in March.

## 2011-11-01 ENCOUNTER — Ambulatory Visit (HOSPITAL_COMMUNITY): Payer: 59 | Attending: Internal Medicine

## 2011-11-01 DIAGNOSIS — R0602 Shortness of breath: Secondary | ICD-10-CM | POA: Insufficient documentation

## 2011-11-01 DIAGNOSIS — R0609 Other forms of dyspnea: Secondary | ICD-10-CM

## 2011-11-01 DIAGNOSIS — R0600 Dyspnea, unspecified: Secondary | ICD-10-CM

## 2011-11-01 DIAGNOSIS — D869 Sarcoidosis, unspecified: Secondary | ICD-10-CM

## 2011-11-13 DIAGNOSIS — R0602 Shortness of breath: Secondary | ICD-10-CM

## 2011-11-21 ENCOUNTER — Encounter: Payer: Self-pay | Admitting: *Deleted

## 2011-11-21 NOTE — Progress Notes (Signed)
Quick Note:  Called pt again and still unable to reach. Will send letter to call for results. ______

## 2011-11-27 ENCOUNTER — Telehealth: Payer: Self-pay | Admitting: Internal Medicine

## 2011-11-27 NOTE — Telephone Encounter (Signed)
lmomtcb x1 

## 2011-11-27 NOTE — Telephone Encounter (Signed)
Notes Recorded by Nyoka Cowden, MD on 11/13/2011 at 5:39 PM Call patient : Study is unremarkable x for what is likely deconditioning - I don't have any further recs but would strongly rec she set up an appt with Ramaswamy for 2nd opinion and go over the details of the study since he is our exercise specialist.     The above results are what the pt is calling in reference to.  I ATC her back at the last number left and NA, mailbox not yet set up so will have TCB.

## 2011-11-27 NOTE — Telephone Encounter (Signed)
Spoke with pt and notified of results per Dr. Sherene Sires. Pt verbalized understanding and denied any questions. OV with MR for second opinion 01-10-12 at 3:30 pm.

## 2011-11-27 NOTE — Telephone Encounter (Signed)
Pt returned call.  Call back at  [phone] Kathleen [name]

## 2011-11-29 ENCOUNTER — Telehealth: Payer: Self-pay | Admitting: Internal Medicine

## 2011-11-29 MED ORDER — ESOMEPRAZOLE MAGNESIUM 40 MG PO PACK: 40.0000 mg | PACK | Freq: Every day | ORAL | Status: DC

## 2011-11-29 NOTE — Telephone Encounter (Signed)
I spoke with pt and she stated she needed her nexium rx sent to [facility] pharm. I advised will send rx and nothing further was needed

## 2011-11-29 NOTE — Telephone Encounter (Signed)
I spoke with pt and she states she has been having some cough and congestion. Pt is scheduled to come in and see tp 11/30/11 at 2:15 w/ TP to be evaluated. She is aware to seek emergency care if she worsens. She voiced her understanding and had no questions

## 2011-11-30 ENCOUNTER — Encounter: Payer: Self-pay | Admitting: Adult Health

## 2011-11-30 ENCOUNTER — Ambulatory Visit (INDEPENDENT_AMBULATORY_CARE_PROVIDER_SITE_OTHER): Payer: 59 | Admitting: Adult Health

## 2011-11-30 VITALS — BP 100/58 | HR 81 | Temp 96.8°F | Ht 70.0 in | Wt 187.0 lb

## 2011-11-30 DIAGNOSIS — D869 Sarcoidosis, unspecified: Secondary | ICD-10-CM

## 2011-11-30 MED ORDER — PREDNISONE 10 MG PO TABS: ORAL_TABLET | ORAL | Status: DC

## 2011-11-30 MED ORDER — ONDANSETRON HCL 4 MG PO TABS: 4.0000 mg | ORAL_TABLET | Freq: Three times a day (TID) | ORAL | Status: DC | PRN

## 2011-11-30 NOTE — Progress Notes (Signed)
Patient ID: Kathleen [name], female   DOB: [birth year] .   MRN: [id number]  Subjective:     Brief patient profile:  51 yowf never smoker who has a history of sarcoid w/ positive biopsy for noncaseating granulomatous inflammation in 1994. Prev. on chronic steroids off since around 2005 w. w/ no convincing active sarcoid. Has hx of atypical chest pain evaluated in November 12, 2005 and felt to be irritable bowel syndrome and GERD.     05/14/2011 Acute OV NP Cc chest congestion and wheezing for several weeks, along with SOB at rest and exertion . Has a lot of rattling with breathing, chest tightness. Wheezing a lot at night .  Mucus is thick can not get up at times. Has rattling esp at night.  No otc used.  occasional heartburn , nexium helps.  >>Augmentin , and steroids   06/08/2011 Acute OV/NP cc  Dry cough, rattle in throat that is worse at night. Cough but can't get  Anything up. Finshed abx and steroids . Feels better but not over her bronchitic flare last month.  Feels she gets short of breath easily, has to rest with walking at times.  No discolored mucus or fever. No chest pain or edema  rec Add Zyrtec 10mg  At bedtime   Add Pepcid 20 mg At bedtime   Begin Symbicort 80/4.87mcg 2 puffs Twice daily   follow up Dr. Sherene Sires  In 4 weeks w/ PFTs    08/24/2011 f/u ov/Wert cc progressively worse doe x months, indolent onset, cough better with gerd rx. Has trouble getting across parking lot now at work where this didn't used to be the case. No better on symbicort. No obst on pft's rec Stop symbicort Try taking xanax 0.5 mg one half to one  every 8 hours as needed for nerves Continue the nexium 40 mg Take 30-60 min before first meal of the day and pepcid at bedtime CT chest > sarcoid changes only, chronic appearing   08/31/2011 f/u ov/Wert cough gone cc no change doe, no cough or rash or ocular/articular complaints, no worse off symbicort, some better on xanax when she takes it, which is not  often. rec Please remember to go to the lab department  downstairs for your tests - we will call you with the results when they are available. Start Propranolol 80 mg one daily and use xanax as needed for breathing attacks at rest    10/15/2011 f/u ov/Wert cc sob "all the time"  =  Doe x across the parking lot, also room to room exactly the same problem, not really proportional to activity,  not taking propranolol - tramadol helps urge to cough. >>tramadol/delsym  11/30/2011 Acute OV  Complains of head congestion, dry cough, chest congestion, wheezing, increased SOB, nausea/vomiting x1day . Coughing so hard she vomits. No fever or body aches. Has had a lot of nausea and had vomiting. This morning. Complains of a severe dry barky cough. Patient has been using Delsym and tramadol with some relief. She denies any chest pain, hemoptysis, abdominal pain, bloody stools, diarrhea, or leg swelling. No recent travel or antibiotic use.        Past Medical History:  Sarcoidosis  -positive biopsy for NCG 1994 rx steroids x several years only  Breast fibrocystic-prev. 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 Nl external ear canals without cough reflex, clear nasal drainage, non tender sinus.  NECK : without JVD/Nodes/TM/ nl carotid upstrokes bilaterally  LUNGS coarse BS w/ no wheezing  CV: RRR no s3 or murmur or increase in P2, no edema  ABD: soft and nontender with nl excursion in the supine position. No bruits or  organomegaly, bowel sounds nl, no guarding.  MS: warm without deformities, calf tenderness, cyanosis or clubbing     CXR  08/24/2011 :  Stable architectural distortion and bilateral upper lobe and perihilar scarring with volume loss consistent with chronic sarcoid. No definite superimposed infiltrates.   CTa chest 08/24/11 Sarcoid changes mostly upper lobe, no pe    Assessment & Plan:

## 2011-11-30 NOTE — Assessment & Plan Note (Signed)
Sarcoid Flare with associated viral illness  Plan:  Mucinex DM Twice daily  As needed  Cough/congestion  Fluids and rest  Prednisone taper over next week.  Advance diet as tolerated.  Zofran 4mg  every 8 hr As needed  Nausea.  Tylenol As needed  Aches.  Please contact office for sooner follow up if symptoms do not improve or worsen or seek emergency care  follow up Dr. Sherene Sires  As planned and As needed

## 2011-11-30 NOTE — Patient Instructions (Addendum)
Mucinex DM Twice daily  As needed  Cough/congestion  Fluids and rest  Prednisone taper over next week.  Advance diet as tolerated.  Zofran 4mg  every 8 hr As needed  Nausea.  Tylenol As needed  Aches.  Please contact office for sooner follow up if symptoms do not improve or worsen or seek emergency care  follow up Dr. Sherene Sires  As planned and As needed

## 2012-01-10 ENCOUNTER — Institutional Professional Consult (permissible substitution): Payer: 59 | Admitting: Internal Medicine

## 2012-03-03 ENCOUNTER — Other Ambulatory Visit: Payer: Self-pay | Admitting: Internal Medicine

## 2012-03-03 NOTE — Telephone Encounter (Signed)
Dr. Sherene Sires, is this okay to refill? Pls advise, thanks!

## 2012-03-04 ENCOUNTER — Telehealth (INDEPENDENT_AMBULATORY_CARE_PROVIDER_SITE_OTHER): Payer: Self-pay | Admitting: General Surgery

## 2012-03-04 NOTE — Telephone Encounter (Signed)
Pt has discovered a new lump in the top of her Rt breast.  It has been there for about a week now and is not diminishing in size.  It is somewhat tender.  Pt is quite anxious about it.  She was last seen by Dr. Derrell Lolling last August 2012, so I made her an appt for 04/09/12, but informed her we may need to order some imaging well-ahead of the appt.  [She works for [employer], near [facility].]  Please call her with any ordered testing as she is eager to comply.

## 2012-03-05 ENCOUNTER — Other Ambulatory Visit (INDEPENDENT_AMBULATORY_CARE_PROVIDER_SITE_OTHER): Payer: Self-pay | Admitting: General Surgery

## 2012-03-05 ENCOUNTER — Telehealth (INDEPENDENT_AMBULATORY_CARE_PROVIDER_SITE_OTHER): Payer: Self-pay | Admitting: General Surgery

## 2012-03-05 DIAGNOSIS — N6310 Unspecified lump in the right breast, unspecified quadrant: Secondary | ICD-10-CM

## 2012-03-05 NOTE — Telephone Encounter (Signed)
Called patient to advise Dr. Derrell Lolling made aware of her call regarding breast lump. Orders submitted to the breast center for testing. Patient advised of the tests requested and given their direct contact number in order to set up appointment. I advised the patient that he will need those tests completed before seeing her. I advised her to please call me back with the dates she has set up and I will also look out for an earlier appointment time than what is in the system right now. Patient agreed.

## 2012-03-07 ENCOUNTER — Telehealth: Payer: Self-pay | Admitting: Internal Medicine

## 2012-03-07 MED ORDER — ALPRAZOLAM 0.5 MG PO TABS: ORAL_TABLET | ORAL | Status: DC

## 2012-03-07 NOTE — Telephone Encounter (Signed)
Called the pharmacy at [facility] out pt and they stated that they did not have any refills of the alprazolam on file for the pt.  Looks like this was called in on 7/8 but the pharmacy did not have this.  Called the refills in #90 with 3 refills.  Called and spoke with pt and she is aware.

## 2012-03-14 ENCOUNTER — Ambulatory Visit
Admission: RE | Admit: 2012-03-14 | Discharge: 2012-03-14 | Disposition: A | Discharge status: Home / Self Care | Payer: 59 | Source: Ambulatory Visit | Attending: General Surgery | Admitting: General Surgery

## 2012-03-14 ENCOUNTER — Other Ambulatory Visit (INDEPENDENT_AMBULATORY_CARE_PROVIDER_SITE_OTHER): Payer: Self-pay | Admitting: General Surgery

## 2012-03-14 DIAGNOSIS — N6310 Unspecified lump in the right breast, unspecified quadrant: Secondary | ICD-10-CM

## 2012-04-09 ENCOUNTER — Ambulatory Visit (INDEPENDENT_AMBULATORY_CARE_PROVIDER_SITE_OTHER): Payer: Commercial Managed Care - PPO | Admitting: General Surgery

## 2012-04-09 ENCOUNTER — Telehealth (INDEPENDENT_AMBULATORY_CARE_PROVIDER_SITE_OTHER): Payer: Self-pay | Admitting: General Surgery

## 2012-04-09 ENCOUNTER — Encounter (INDEPENDENT_AMBULATORY_CARE_PROVIDER_SITE_OTHER): Payer: Self-pay | Admitting: General Surgery

## 2012-04-09 VITALS — BP 106/58 | HR 76 | Temp 97.2°F | Resp 20 | Ht 70.5 in | Wt 191.6 lb

## 2012-04-09 DIAGNOSIS — N6019 Diffuse cystic mastopathy of unspecified breast: Secondary | ICD-10-CM

## 2012-04-09 NOTE — Telephone Encounter (Signed)
Called patient cell/home number and was not able to leave a message due to voicemail box not having been set up yet. Called patient work number and had to leave a message for call to be returned to determine why appointment with Dr. Derrell Lolling was missed.

## 2012-04-09 NOTE — Telephone Encounter (Signed)
Patient returned your call about rescheduling an appt. Please call patient.

## 2012-04-09 NOTE — Patient Instructions (Signed)
Your breast exam shows no mass or abnormality. Your recent mammograms showed no area of concern for malignancy. The large, 5 cm cyst in the right breast was aspirated, and fortunately that has improved your pain.  You are advised to discontinue caffeine and chocolate and to take vitamin D daily, as was advised last year.  Because of your family history of breast cancer in your mother,   you need careful annual screening with bilateral mammograms and a physician breast exam. You states that he will see Dr. Richardean Chimera annually.  Return to see Dr. Derrell Lolling if further problems arise.

## 2012-04-09 NOTE — Progress Notes (Signed)
Patient ID: Kathleen [name], female   DOB: Mar 19, 1961, 51 y.o.   MRN: [id number]  Chief Complaint  Patient presents with  . Other    eval new lump in rt br    HPI Kathleen [name] is a 51 y.o. female.  She returns for breast exam following annual mammograms and aspiration of a right breast cyst.  On 01/12/2011 this patient underwent right partial mastectomy with needle localization. Fortunately this showed fibrocystic disease and calcifications and adenosis. She is at increased risk because of a family history of breast cancer in her mother.  She says that her right breast pain resolved until recently when she developed a large cyst and tenderness on the right. Mammograms and right breast ultrasound were performed on 03/14/2012. They showed  a 5 cm simple cyst in the right breast which accounted for the palpable lump and pain. No other abnormalities were noted.Britta Mccreedy aspirated this and the patient felt much better. She still has some pain but it is not very bad.  She presents today for a followup exam HPI  Past Medical History  Diagnosis Date  . Sarcoidosis   . Breast fibrocystic disorder   . Mitral valve prolapse   . Breast pain     right  . Normal stress echocardiogram Jan 2011  . Normal nuclear stress test December 2012  . Normal echocardiogram December 2012    EF 55 to 60%; mild MVP, grade 1 diastolic dysfunction, PAP 32    Past Surgical History  Procedure Date  . Breast surgery   . Tubal ligation     Family History  Problem Relation Age of Onset  . Cardiomyopathy Mother   . Breast cancer Mother   . Cancer Mother     breast  . Heart disease Maternal Aunt   . Cancer Father     prostate    Social History History  Substance Use Topics  . Smoking status: Never Smoker   . Smokeless tobacco: Never Used  . Alcohol Use: No    Allergies  Allergen Reactions  . Codeine     REACTION: itch    Current Outpatient Prescriptions  Medication Sig Dispense Refill  .  ALPRAZolam (XANAX) 0.5 MG tablet Take 1/2 to 1 tablet by mouth three times daily as needed for anxiety  90 tablet  3  . esomeprazole (NEXIUM) 40 MG packet Take 40 mg by mouth daily before breakfast.  30 each  5  . famotidine (PEPCID) 10 MG tablet 2 at bedtime      . ibuprofen (ADVIL,MOTRIN) 200 MG tablet Take 200 mg by mouth every 6 (six) hours as needed.        . propranolol (INDERAL LA) 80 MG 24 hr capsule One half each am  30 capsule  11  . traMADol (ULTRAM) 50 MG tablet 1-2 every 4-6 hrs as needed       . ondansetron (ZOFRAN) 4 MG tablet Take 1 tablet (4 mg total) by mouth every 8 (eight) hours as needed for nausea.  30 tablet  0  . predniSONE (DELTASONE) 10 MG tablet 4 tabs for 2 days, then 3 tabs for 2 days, 2 tabs for 2 days, then 1 tab for 2 days, then stop  20 tablet  0    Review of Systems Review of Systems  Constitutional: Negative for fever, chills and unexpected weight change.  HENT: Negative for hearing loss, congestion, sore throat, trouble swallowing and voice change.   Eyes: Negative for visual  disturbance.  Respiratory: Negative for cough and wheezing.   Cardiovascular: Negative for chest pain, palpitations and leg swelling.  Gastrointestinal: Negative for nausea, vomiting, abdominal pain, diarrhea, constipation, blood in stool, abdominal distention and anal bleeding.  Genitourinary: Negative for hematuria, vaginal bleeding and difficulty urinating.  Musculoskeletal: Negative for arthralgias.  Skin: Negative for rash and wound.  Neurological: Negative for seizures, syncope and headaches.  Hematological: Negative for adenopathy. Does not bruise/bleed easily.  Psychiatric/Behavioral: Negative for confusion.    Blood pressure 106/58, pulse 76, temperature 97.2 F (36.2 C), temperature source Temporal, resp. rate 20, height 5' 10.5" (1.791 m), weight 191 lb 9.6 oz (86.909 kg), last menstrual period 03/24/2012.  Physical Exam Physical Exam  Constitutional: She is oriented  to person, place, and time. She appears well-developed and well-nourished. No distress.  HENT:  Head: Normocephalic and atraumatic.  Pulmonary/Chest:       Right breast is soft without palpable mass. Minimally tender. Recent biopsy site. No adenopathy. No other skin change.  Neurological: She is alert and oriented to person, place, and time. She exhibits normal muscle tone.  Skin: Skin is warm and dry. No rash noted. She is not diaphoretic. No erythema. No pallor.  Psychiatric: She has a normal mood and affect. Her behavior is normal. Judgment and thought content normal.    Data Reviewed Mammograms and ultrasound.  Assessment    Fibrocystic disease right breast with recent aspiration of macrocyst. Symptomatically improved.  Mild chronic right breast pain  Family history breast cancer in mother    Plan    Discontinue caffeine and chocolate. Vitamin D 400 IU daily  Annual mammography and breast exam. She will followup with Dr. Richardean Chimera  Return to see me if any further problems arise.       Angelia Mould. Derrell Lolling, M.D., [facility] General and Minimally invasive Surgery Breast and Colorectal Surgery Office:   [phone] Pager:   [phone]  04/09/2012, 5:30 PM

## 2012-05-02 ENCOUNTER — Encounter: Payer: Self-pay | Admitting: Internal Medicine

## 2012-05-02 ENCOUNTER — Ambulatory Visit (INDEPENDENT_AMBULATORY_CARE_PROVIDER_SITE_OTHER): Payer: 59 | Admitting: Internal Medicine

## 2012-05-02 VITALS — BP 102/68 | HR 86 | Temp 97.0°F | Ht 70.0 in | Wt 189.4 lb

## 2012-05-02 DIAGNOSIS — R05 Cough: Secondary | ICD-10-CM | POA: Insufficient documentation

## 2012-05-02 DIAGNOSIS — R059 Cough, unspecified: Secondary | ICD-10-CM

## 2012-05-02 MED ORDER — TRAMADOL HCL 50 MG PO TABS: ORAL_TABLET | ORAL | Status: DC

## 2012-05-02 MED ORDER — PROPRANOLOL HCL ER 80 MG PO CP24: 80.0000 mg | ORAL_CAPSULE | Freq: Two times a day (BID) | ORAL | Status: DC

## 2012-05-02 MED ORDER — FAMOTIDINE 20 MG PO TABS: ORAL_TABLET | ORAL | Status: DC

## 2012-05-02 MED ORDER — PREDNISONE (PAK) 10 MG PO TABS: ORAL_TABLET | ORAL | Status: AC

## 2012-05-02 NOTE — Progress Notes (Signed)
Patient ID: Kathleen [name], female   DOB: 07-13-1961 .   MRN: [id number]  Subjective:     Brief patient profile:  51 yowf never smoker who has a history of sarcoid w/ positive biopsy for noncaseating granulomatous inflammation in 1994. Prev. on chronic steroids off since around 2005 w. w/ no convincing active sarcoid. Has hx of atypical chest pain evaluated in November 12, 2005 and felt to be irritable bowel syndrome and GERD.     05/14/2011 Acute OV NP Cc chest congestion and wheezing for several weeks, along with SOB at rest and exertion . Has a lot of rattling with breathing, chest tightness. Wheezing a lot at night .  Mucus is thick can not get up at times. Has rattling esp at night.  No otc used.  occasional heartburn , nexium helps.  >>Augmentin , and steroids   06/08/2011 Acute OV/NP cc  Dry cough, rattle in throat that is worse at night. Cough but can't get  Anything up. Finshed abx and steroids . Feels better but not over her bronchitic flare last month.  Feels she gets short of breath easily, has to rest with walking at times.  No discolored mucus or fever. No chest pain or edema  rec Add Zyrtec 10mg  At bedtime   Add Pepcid 20 mg At bedtime   Begin Symbicort 80/4.53mcg 2 puffs Twice daily   follow up Dr. Sherene Sires  In 4 weeks w/ PFTs    08/24/2011 f/u ov/Kathleen [name] cc progressively worse doe x months, indolent onset, cough better with gerd rx. Has trouble getting across parking lot now at work where this didn't used to be the case. No better on symbicort. No obst on pft's rec Stop symbicort Try taking xanax 0.5 mg one half to one  every 8 hours as needed for nerves Continue the nexium 40 mg Take 30-60 min before first meal of the day and pepcid at bedtime CT chest > sarcoid changes only, chronic appearing   08/31/2011 f/u ov/Kathleen [name] cough gone cc no change doe, no cough or rash or ocular/articular complaints, no worse off symbicort, some better on xanax when she takes it, which is not  often. rec Please remember to go to the lab department  downstairs for your tests - we will call you with the results when they are available. Start Propranolol 80 mg one daily and use xanax as needed for breathing attacks at rest    10/15/2011 f/u ov/Kathleen [name] cc sob "all the time"  =  Doe x across the parking lot, also room to room exactly the same problem, not really proportional to activity,  not taking propranolol - tramadol helps urge to cough. >>tramadol/delsym Propranolol 80 mg one half daily Please see patient coordinator before you leave today  to schedule cpst > Deconditioned / rec reconditions and f/u with Dr Marchelle Gearing > did not set up  11/30/2011 Acute OV  Complains of head congestion, dry cough, chest congestion, wheezing, increased SOB, nausea/vomiting x1day . Coughing so hard she vomits. No fever or body aches. Has had a lot of nausea and had vomiting. This morning. Complains of a severe dry barky cough. Patient has been using Delsym and tramadol with some relief. rec Mucinex DM Twice daily  As needed  Cough/congestion  Fluids and rest  Prednisone taper over next week.  Advance diet as tolerated.  Zofran 4mg  every 8 hr As needed  Nausea.  Tylenol As needed  Aches.   05/02/2012 f/u ov/Kathleen [name] cc breathing some better overall,  but recurrent "cough hard she vomits" x 24 h and no better with delsym and tramadol.  Maintaining on nexium daily  before bfast. No purulent sputum or overt sinus/ hb symptoms.    Sleeping ok without nocturnal  or early am exacerbation  of respiratory  c/o's or need for noct saba. Also denies any obvious fluctuation of symptoms with weather or environmental changes or other aggravating or alleviating factors except as outlined above   ROS  The following are not active complaints unless bolded sore throat, dysphagia, dental problems, itching, sneezing,  nasal congestion or excess/ purulent secretions, ear ache,   fever, chills, sweats, unintended wt loss, pleuritic  or exertional cp, hemoptysis,  orthopnea pnd or leg swelling, presyncope, palpitations, heartburn, abdominal pain, anorexia, nausea, vomiting, diarrhea  or change in bowel or urinary habits, change in stools or urine, dysuria,hematuria,  rash, arthralgias, visual complaints, headache, numbness weakness or ataxia or problems with walking or coordination,  change in mood/affect(anxious and depressed, xanax not helping) or memory.               Past Medical History:  Sarcoidosis  -positive biopsy for NCG 1994 rx steroids x several years only  Breast fibrocystic-prev. 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 Nl external ear canals without cough reflex, clear nasal drainage, non tender sinus.  NECK : without JVD/Nodes/TM/ nl carotid upstrokes bilaterally  LUNGS coarse BS w/ no wheezing  CV: RRR no s3 or murmur or increase in P2, no edema  ABD: soft and nontender with nl excursion in the supine position. No bruits or organomegaly, bowel sounds nl, no guarding.  MS: warm without deformities, calf tenderness, cyanosis or clubbing     CXR  08/24/2011 :  Stable architectural distortion and bilateral upper lobe and perihilar scarring with volume loss consistent with chronic sarcoid. No definite superimposed infiltrates.   CTa chest 08/24/11 Sarcoid changes mostly upper lobe, no pe    Assessment & Plan:

## 2012-05-02 NOTE — Assessment & Plan Note (Addendum)
DDX of  difficult airways managment all start with A and  include Adherence, Ace Inhibitors, Acid Reflux, Active Sinus Disease, Alpha 1 Antitripsin deficiency, Anxiety masquerading as Airways dz,  ABPA,  allergy(esp in young), Aspiration (esp in elderly), Adverse effects of DPI,  Active smokers, plus two Bs  = Bronchiectasis and Beta blocker use..and one C= CHF   ? Adherence.  To keep things simple, I have asked the patient to first separate medicines that are perceived as maintenance, that is to be taken daily "no matter what", from those medicines that are taken on only on an as-needed basis and I have given the patient examples of both, and then return to see our NP to generate a  detailed  medication calendar which should be followed until the next physician sees the patient and updates it.   ? Acid reflux > diet plus ppi plus h2hs reviewed  ? Anxiety/ depression,  Usually a dx of exclusion but I think a large component of her problems - try increasing propranolol to 80 mg bid as clearly not adequately beta blocked at present and refer back to primary for ? Psych eval vs start on SSRI?   See instructions for specific recommendations which were reviewed directly with the patient who was given a copy with highlighter outlining the key components.

## 2012-05-02 NOTE — Patient Instructions (Addendum)
Excuse from work for 2 weeks > in meantime please see Dr Sudie Bailey or employee assistance for referral for anxiety issue  Take delsym two tsp every 12 hours and supplement if needed with  tramadol 50 mg up to 2 every 4 hours to suppress the urge to cough. Swallowing water or using ice chips/non mint and menthol containing candies (such as lifesavers or sugarless jolly ranchers) are also effective.  You should rest your voice and avoid activities that you know make you cough.  Once you have eliminated the cough for 3 straight days try reducing the tramadol first,  then the delsym as tolerated.    Increase your propranolol to 80 mg twice daily  Prednisone 10 mg take  4 each am x 2 days,   2 each am x 2 days,  1 each am x2days and stop   See Yashica NP w/in 2 weeks with all your medications, even over the counter meds, separated in two separate bags, the ones you take no matter what vs the ones you stop once you feel better and take only as needed when you feel you need them.   [place]  will generate for you a new user friendly medication calendar that will put [place] all on the same page re: your medication use.     Without this process, it simply isn't possible to assure that we are providing  your outpatient care  with  the attention to detail we feel you deserve.   If we cannot assure that you're getting that kind of care,  then we cannot manage your problem effectively from this clinic.  Once you have seen Penne and we are sure that we're all on the same page with your medication use she will arrange follow up with me.

## 2012-05-12 ENCOUNTER — Encounter: Payer: Self-pay | Admitting: Internal Medicine

## 2012-05-15 ENCOUNTER — Other Ambulatory Visit (INDEPENDENT_AMBULATORY_CARE_PROVIDER_SITE_OTHER): Payer: 59

## 2012-05-15 ENCOUNTER — Encounter: Payer: Self-pay | Admitting: Internal Medicine

## 2012-05-15 ENCOUNTER — Ambulatory Visit (INDEPENDENT_AMBULATORY_CARE_PROVIDER_SITE_OTHER): Payer: 59 | Admitting: Internal Medicine

## 2012-05-15 VITALS — BP 104/60 | HR 80 | Ht 70.0 in | Wt 186.0 lb

## 2012-05-15 DIAGNOSIS — R6881 Early satiety: Secondary | ICD-10-CM

## 2012-05-15 DIAGNOSIS — R112 Nausea with vomiting, unspecified: Secondary | ICD-10-CM

## 2012-05-15 LAB — CBC WITH DIFFERENTIAL/PLATELET
Basophils Absolute: 0 10*3/uL (ref 0.0–0.1)
Basophils Relative: 0.5 % (ref 0.0–3.0)
Eosinophils Absolute: 0.2 10*3/uL (ref 0.0–0.7)
Eosinophils Relative: 4.2 % (ref 0.0–5.0)
HCT: 35 % — ABNORMAL LOW (ref 36.0–46.0)
Hemoglobin: 11.8 g/dL — ABNORMAL LOW (ref 12.0–15.0)
Lymphocytes Relative: 17.8 % (ref 12.0–46.0)
Lymphs Abs: 1 10*3/uL (ref 0.7–4.0)
MCHC: 33.7 g/dL (ref 30.0–36.0)
MCV: 82.9 fl (ref 78.0–100.0)
Monocytes Absolute: 0.6 10*3/uL (ref 0.1–1.0)
Monocytes Relative: 9.9 % (ref 3.0–12.0)
Neutro Abs: 4 10*3/uL (ref 1.4–7.7)
Neutrophils Relative %: 67.6 % (ref 43.0–77.0)
Platelets: 338 10*3/uL (ref 150.0–400.0)
RBC: 4.22 Mil/uL (ref 3.87–5.11)
RDW: 15.5 % — ABNORMAL HIGH (ref 11.5–14.6)
WBC: 5.9 10*3/uL (ref 4.5–10.5)

## 2012-05-15 LAB — BASIC METABOLIC PANEL
BUN: 7 mg/dL (ref 6–23)
CO2: 24 mEq/L (ref 19–32)
Calcium: 8.9 mg/dL (ref 8.4–10.5)
Chloride: 101 mEq/L (ref 96–112)
Creatinine, Ser: 0.8 mg/dL (ref 0.4–1.2)
GFR: 84.47 mL/min (ref >60.00)
Glucose, Bld: 96 mg/dL (ref 70–99)
Potassium: 3.6 mEq/L (ref 3.5–5.1)
Sodium: 133 mEq/L — ABNORMAL LOW (ref 135–145)

## 2012-05-15 NOTE — Patient Instructions (Signed)
Your physician has requested that you go to the basement for the following lab work before leaving today:CBC, Bmet.  You have been scheduled for an endoscopy with propofol. Please follow written instructions given to you at your visit today. If you use inhalers (even only as needed), please bring them with you on the day of your procedure.  You have been given a Gastroparesis diet to start following Step 1 diet and slowly progress to Step 2 diet.  Continue to use Ondansetron for Nausea.

## 2012-05-15 NOTE — Progress Notes (Addendum)
  Subjective:    Patient ID: Kathleen [name], female    DOB: 04-19-1961, 51 y.o.   MRN: [id number]  HPI This middle-aged married white woman, a [employer] employee, as a few weeks worth of nausea, vomiting and early satiety. She has lost her appetite as well. She has been on Nexium and Pepcid 4 months at least, dating back to at least the spring to try to reduce chronic cough and throat clearing, managed by Dr. Sherene Sires. She can only a few bites before she feels full, and she either regurgitates her vomitus soon after eating at times. There is no dysphagia. She says her heartburn is controlled on her Nexium and Pepcid. Never had problems like this before. She does suffer from chronic constipation and uses fiber pills and MiraLax and moves her bowels every 2-3 days, there is no change. There is no abdominal distention. She feels nauseous when she awakens in the morning at times. She is using her ondansetron with some relief. She does have adult on her medication list but says she uses it frequently.  She does have a history of sarcoidosis though I don't think she's never had any gastrointestinal involvement.  GI review of systems is otherwise negative  She does have recent stress with work, and her father-in-law might have pancreatic cancer, this is been playing out over the last few weeks. She has some chronic intermittent anxiety and depressive symptoms but nothing persistent she says.  Medications, allergies, past medical history, past surgical history, family history and social history are reviewed and updated in the EMR.  Review of Systems As per history of present illness, she has also had an upper respiratory infection described as a cold with sinus and nasal drainage that is improving. It was associated with some shortness of breath. All other review of systems are negative.    Objective:   Physical Exam General:  Well-developed, well-nourished and in no acute distress Eyes:  anicteric. ENT:      Mouth and posterior pharynx free of lesions.  Neck:   supple w/o thyromegaly or mass.  Lungs: Clear to auscultation bilaterally. Heart:  S1S2, no rubs, murmurs, gallops. Abdomen:  soft, non-tender, no hepatosplenomegaly, hernia, or mass and BS+. There is no succussion splash Lymph:  no cervical or supraclavicular adenopathy. Extremities:   no edema Skin   no rash. Neuro:  A&O x 3.  Psych:  appropriate mood and  Affect.   Data Reviewed: I have reviewed     Assessment & Plan:   1. Nausea and vomiting   2. Early satiety    1. Cause of these problems not entirely clear. The differential includes some sort of functional gastrointestinal problem, gastroparesis is possible, upper GI tract malignancy is possible, functional disturbance related to new or recent anxiety and stress is also possible. It could be related to her known sarcoidosis as well. 2. I've asked her to try a step 1 moving into step 2  gastroparesis diet. Handout given. 3. I have recommended an upper GI endoscopy to evaluate for cause of this problem. Risks benefits and indications are explained and she understands and agrees to proceed. Alternative evaluation using imaging was discussed also. 4. She is to use her ondansetron for the time being remain on her other medications.

## 2012-05-16 ENCOUNTER — Encounter: Payer: 59 | Admitting: Adult Health

## 2012-05-19 ENCOUNTER — Encounter: Payer: 59 | Admitting: Internal Medicine

## 2012-05-19 ENCOUNTER — Ambulatory Visit (AMBULATORY_SURGERY_CENTER): Payer: 59 | Admitting: Internal Medicine

## 2012-05-19 ENCOUNTER — Encounter: Payer: Self-pay | Admitting: Internal Medicine

## 2012-05-19 VITALS — BP 108/65 | HR 58 | Temp 99.2°F | Resp 16 | Ht 70.0 in | Wt 186.0 lb

## 2012-05-19 DIAGNOSIS — R112 Nausea with vomiting, unspecified: Secondary | ICD-10-CM

## 2012-05-19 DIAGNOSIS — A048 Other specified bacterial intestinal infections: Secondary | ICD-10-CM

## 2012-05-19 DIAGNOSIS — R110 Nausea: Secondary | ICD-10-CM

## 2012-05-19 DIAGNOSIS — R6881 Early satiety: Secondary | ICD-10-CM

## 2012-05-19 MED ORDER — ONDANSETRON HCL 4 MG/2ML IJ SOLN
4.0000 mg | Freq: Once | INTRAMUSCULAR | Status: AC
  Administered 2012-05-19: 4 mg via INTRAVENOUS

## 2012-05-19 MED ORDER — SODIUM CHLORIDE 0.9 % IV SOLN: 500.0000 mL | INTRAVENOUS | Status: DC

## 2012-05-19 NOTE — Op Note (Signed)
[facility] [address] [place], [address]   ENDOSCOPY PROCEDURE REPORT  PATIENT: Kathleen [name], Kathleen [name]  MR#: [id number] BIRTHDATE: 10-21-1960 , 51  yrs. old GENDER: Female ENDOSCOPIST: Iva Boop, MD, [facility] REFERRED BY: PROCEDURE DATE:  05/19/2012 PROCEDURE:  EGD w/ biopsy ASA CLASS:     Class II INDICATIONS:  nausea.   vomiting. MEDICATIONS: Propofol (Diprivan) 140 mg IV, MAC sedation, administered by CRNA, and These medications were titrated to patient response per physician's verbal order TOPICAL ANESTHETIC: none  DESCRIPTION OF PROCEDURE: After the risks benefits and alternatives of the procedure were thoroughly explained, informed consent was obtained.  The LB GIF-H180 [id number] endoscope was introduced through the mouth and advanced to the second portion of the duodenum. Without limitations.  The instrument was slowly withdrawn as the mucosa was fully examined.    The upper, middle and distal third of the esophagus were carefully inspected and no abnormalities were noted.  The z-line was well seen at the GEJ.  The endoscope was pushed into the fundus which was normal including a retroflexed view.  The antrum, gastric body, first and second part of the duodenum were unremarkable.  Multiple random biopsies were performed in the stomach.  Retroflexed views revealed no abnormalities.     The scope was then withdrawn from the patient and the procedure completed.  COMPLICATIONS: There were no complications. ENDOSCOPIC IMPRESSION: Normal EGD; multiple random biopsies of stomach taken given history of sarcoidosis  RECOMMENDATIONS: await biopsy results, consider gastric emptying study vs. trial of metaclopramide try alprazolam before meals while waiting for pathology results    eSigned:  Iva Boop, MD, [facility] 05/19/2012 11:50 AM   AO:[id number] B Sherene Sires, MD and The Patient

## 2012-05-19 NOTE — Progress Notes (Signed)
Patient did not experience any of the following events: a burn prior to discharge; a fall within the facility; wrong site/side/patient/procedure/implant event; or a hospital transfer or hospital admission upon discharge from the facility. (G8907) Patient did not have preoperative order for IV antibiotic SSI prophylaxis. (G8918)  

## 2012-05-19 NOTE — Progress Notes (Signed)
Pt complained of nausea. Vital signs were taken per MD request and told to MD. Orders received to give 4mg  Zofran IV now. Right Hand IV was started. Zofran was given. Pt's IVF left open to gravity per Dr. Leone Payor.

## 2012-05-19 NOTE — Progress Notes (Signed)
When taken to the procedure room, pt states her nausea is "slightly better."

## 2012-05-19 NOTE — Patient Instructions (Addendum)
Your upper endoscopy exam was normal. I did take biopsies of the stomach to see if there was any sarcoid involvement or another cause of stomach problems. I will let you know those results later this week.  Please see if the alprazolam helps your nausea and stomach symptoms, try taking that 30 minutes before you eat to see if it helps. If it makes you sleepy take a half a tablet.  Thank you for choosing me and [facility].  Iva Boop, MD, FACG YOU HAD AN ENDOSCOPIC PROCEDURE TODAY AT THE [facility]: Refer to the procedure report that was given to you for any specific questions about what was found during the examination.  If the procedure report does not answer your questions, please call your gastroenterologist to clarify.  If you requested that your care partner not be given the details of your procedure findings, then the procedure report has been included in a sealed envelope for you to review at your convenience later.  YOU SHOULD EXPECT: Some feelings of bloating in the abdomen. Passage of more gas than usual.  Walking can help get rid of the air that was put into your GI tract during the procedure and reduce the bloating. If you had a lower endoscopy (such as a colonoscopy or flexible sigmoidoscopy) you may notice spotting of blood in your stool or on the toilet paper. If you underwent a bowel prep for your procedure, then you may not have a normal bowel movement for a few days.  DIET: Your first meal following the procedure should be a light meal and then it is ok to progress to your normal diet.  A half-sandwich or bowl of soup is an example of a good first meal.  Heavy or fried foods are harder to digest and may make you feel nauseous or bloated.  Likewise meals heavy in dairy and vegetables can cause extra gas to form and this can also increase the bloating.  Drink plenty of fluids but you should avoid alcoholic beverages for 24 hours.  ACTIVITY: Your care  partner should take you home directly after the procedure.  You should plan to take it easy, moving slowly for the rest of the day.  You can resume normal activity the day after the procedure however you should NOT DRIVE or use heavy machinery for 24 hours (because of the sedation medicines used during the test).    SYMPTOMS TO REPORT IMMEDIATELY: A gastroenterologist can be reached at any hour.  During normal business hours, 8:30 AM to 5:00 PM Monday through Friday, call [phone].  After hours and on weekends, please call the GI answering service at [phone] who will take a message and have the physician on call contact you.   Following lower endoscopy (colonoscopy or flexible sigmoidoscopy):  Excessive amounts of blood in the stool  Significant tenderness or worsening of abdominal pains  Swelling of the abdomen that is new, acute  Fever of 100F or higher  Following upper endoscopy (EGD)  Vomiting of blood or coffee ground material  New chest pain or pain under the shoulder blades  Painful or persistently difficult swallowing  New shortness of breath  Fever of 100F or higher  Black, tarry-looking stools  FOLLOW UP: If any biopsies were taken you will be contacted by phone or by letter within the next 1-3 weeks.  Call your gastroenterologist if you have not heard about the biopsies in 3 weeks.  Our staff will call the home  number listed on your records the next business day following your procedure to check on you and address any questions or concerns that you may have at that time regarding the information given to you following your procedure. This is a courtesy call and so if there is no answer at the home number and we have not heard from you through the emergency physician on call, we will assume that you have returned to your regular daily activities without incident.  SIGNATURES/CONFIDENTIALITY: You and/or your care partner have signed paperwork which will be entered  into your electronic medical record.  These signatures attest to the fact that that the information above on your After Visit Summary has been reviewed and is understood.  Full responsibility of the confidentiality of this discharge information lies with you and/or your care-partner.

## 2012-05-20 ENCOUNTER — Telehealth: Payer: Self-pay | Admitting: *Deleted

## 2012-05-20 NOTE — Telephone Encounter (Signed)
  Follow up Call-  Call back number 05/19/2012  Post procedure Call Back phone  # [phone]  Permission to leave phone message No     Mailbox not set up yet; unable to leave message

## 2012-05-21 ENCOUNTER — Encounter: Payer: 59 | Admitting: Adult Health

## 2012-05-23 ENCOUNTER — Encounter: Payer: Self-pay | Admitting: Internal Medicine

## 2012-05-23 ENCOUNTER — Other Ambulatory Visit: Payer: Self-pay

## 2012-05-23 DIAGNOSIS — K2970 Gastritis, unspecified, without bleeding: Secondary | ICD-10-CM | POA: Insufficient documentation

## 2012-05-23 DIAGNOSIS — B9681 Helicobacter pylori [H. pylori] as the cause of diseases classified elsewhere: Secondary | ICD-10-CM | POA: Insufficient documentation

## 2012-05-23 MED ORDER — BIS SUBCIT-METRONID-TETRACYC 140-125-125 MG PO CAPS: 3.0000 | ORAL_CAPSULE | Freq: Four times a day (QID) | ORAL | Status: DC

## 2012-05-23 NOTE — Progress Notes (Signed)
Quick Note:  Office  Biopsies show H. Pylori + gastritis Needs treatment with Pylera if possible to get that  Office visit in 1 month please to see effects of that treatment  No recall or letter from [facility] ______

## 2012-06-27 ENCOUNTER — Other Ambulatory Visit: Payer: Self-pay | Admitting: Internal Medicine

## 2012-08-18 ENCOUNTER — Encounter (HOSPITAL_COMMUNITY): Payer: Self-pay | Admitting: *Deleted

## 2012-08-18 ENCOUNTER — Emergency Department (HOSPITAL_COMMUNITY)
Admission: EM | Admit: 2012-08-18 | Discharge: 2012-08-19 | Disposition: A | Discharge status: Home / Self Care | Payer: 59 | Attending: Emergency Medicine | Admitting: Emergency Medicine

## 2012-08-18 DIAGNOSIS — R1013 Epigastric pain: Secondary | ICD-10-CM | POA: Insufficient documentation

## 2012-08-18 DIAGNOSIS — K529 Noninfective gastroenteritis and colitis, unspecified: Secondary | ICD-10-CM

## 2012-08-18 DIAGNOSIS — R51 Headache: Secondary | ICD-10-CM | POA: Insufficient documentation

## 2012-08-18 DIAGNOSIS — Z8679 Personal history of other diseases of the circulatory system: Secondary | ICD-10-CM | POA: Insufficient documentation

## 2012-08-18 DIAGNOSIS — K5289 Other specified noninfective gastroenteritis and colitis: Secondary | ICD-10-CM | POA: Insufficient documentation

## 2012-08-18 DIAGNOSIS — Z8742 Personal history of other diseases of the female genital tract: Secondary | ICD-10-CM | POA: Insufficient documentation

## 2012-08-18 DIAGNOSIS — Z8739 Personal history of other diseases of the musculoskeletal system and connective tissue: Secondary | ICD-10-CM | POA: Insufficient documentation

## 2012-08-18 DIAGNOSIS — Z79899 Other long term (current) drug therapy: Secondary | ICD-10-CM | POA: Insufficient documentation

## 2012-08-18 LAB — URINALYSIS, ROUTINE W REFLEX MICROSCOPIC
Bilirubin Urine: NEGATIVE
Glucose, UA: NEGATIVE mg/dL
Ketones, ur: NEGATIVE mg/dL
Nitrite: POSITIVE — AB
Specific Gravity, Urine: 1.01 (ref 1.005–1.030)
Urobilinogen, UA: 0.2 mg/dL (ref 0.0–1.0)
pH: 7 (ref 5.0–8.0)

## 2012-08-18 LAB — URINE MICROSCOPIC-ADD ON

## 2012-08-18 LAB — POCT I-STAT, CHEM 8
BUN: 11 mg/dL (ref 6–23)
Calcium, Ion: 1.13 mmol/L (ref 1.12–1.23)
Chloride: 106 mEq/L (ref 96–112)
Creatinine, Ser: 0.8 mg/dL (ref 0.50–1.10)
Glucose, Bld: 94 mg/dL (ref 70–99)
HCT: 33 % — ABNORMAL LOW (ref 36.0–46.0)
Hemoglobin: 11.2 g/dL — ABNORMAL LOW (ref 12.0–15.0)
Potassium: 3.9 mEq/L (ref 3.5–5.1)
Sodium: 138 mEq/L (ref 135–145)
TCO2: 23 mmol/L (ref 0–100)

## 2012-08-18 MED ORDER — ONDANSETRON HCL 4 MG PO TABS: 4.0000 mg | ORAL_TABLET | Freq: Four times a day (QID) | ORAL | Status: DC

## 2012-08-18 MED ORDER — SODIUM CHLORIDE 0.9 % IV SOLN: 1000.0000 mL | INTRAVENOUS | Status: DC

## 2012-08-18 MED ORDER — ONDANSETRON HCL 4 MG/2ML IJ SOLN
4.0000 mg | Freq: Once | INTRAMUSCULAR | Status: AC
  Administered 2012-08-18: 4 mg via INTRAVENOUS
  Filled 2012-08-18: qty 2

## 2012-08-18 MED ORDER — DIPHENHYDRAMINE HCL 50 MG/ML IJ SOLN
50.0000 mg | Freq: Once | INTRAMUSCULAR | Status: DC
  Filled 2012-08-18: qty 1

## 2012-08-18 MED ORDER — METOCLOPRAMIDE HCL 5 MG/ML IJ SOLN
10.0000 mg | Freq: Once | INTRAMUSCULAR | Status: AC
  Administered 2012-08-18: 10 mg via INTRAVENOUS
  Filled 2012-08-18: qty 2

## 2012-08-18 MED ORDER — SODIUM CHLORIDE 0.9 % IV SOLN
1000.0000 mL | Freq: Once | INTRAVENOUS | Status: AC
  Administered 2012-08-18: 1000 mL via INTRAVENOUS

## 2012-08-18 MED ORDER — SODIUM CHLORIDE 0.9 % IV BOLUS (SEPSIS): 1000.0000 mL | Freq: Once | INTRAVENOUS | Status: DC

## 2012-08-18 NOTE — ED Notes (Signed)
Patient ambulatory to restroom  ?

## 2012-08-18 NOTE — ED Notes (Signed)
Vomiting and diarrhea since last night.  No fever.  Dizzy at times

## 2012-08-18 NOTE — ED Notes (Signed)
Asked patient to collect urine sample at this time. Patient unable to void stating  "I haven't had anything to drink" patient asked to wait a few more minutes

## 2012-08-18 NOTE — ED Provider Notes (Signed)
Patient left with me at change of shift to finish IV hydration.   Patient has had 2 L of fluid. She states she had a small amount urinary output. She also complains of a headache. She will be given IV Reglan and Benadryl for that and another liter of IV fluids and hopefully be discharged.  00:45 headache gone. Sleeping and feels ready to go home.   Diagnoses that have been ruled out:  None  Diagnoses that are still under consideration:  None  Final diagnoses:  Gastroenteritis  Headache   New Prescriptions   ONDANSETRON (ZOFRAN) 4 MG TABLET    Take 1 tablet (4 mg total) by mouth every 6 (six) hours.     Plan discharge   Devoria Albe, MD, Franz Dell, MD 08/19/12 [phone]

## 2012-08-18 NOTE — ED Notes (Signed)
Family member came to desk and stated that her mother needed something for a migraine.  Dr. Lynelle Doctor walking into room.

## 2012-08-18 NOTE — ED Provider Notes (Signed)
History     CSN: [id number]  Arrival date & time 08/18/12  1818   First MD Initiated Contact with Patient 08/18/12 2102      Chief Complaint  Patient presents with  . Emesis    (Consider location/radiation/quality/duration/timing/severity/associated sxs/prior treatment) HPI Comments: Kathleen [name] is a 51 y.o. Female who has had vomiting, and diarrhea, since last night. She has a known sick contacts at home. She denies blood in the emesis or diarrhea. She's not had fever. She has had abdominal cramping, but no persistent, abdominal pain. She denies weakness, or dizziness. There has been no fever. There no known modifying factors  Patient is a 51 y.o. female presenting with vomiting. The history is provided by the patient.  Emesis     Past Medical History  Diagnosis Date  . Sarcoidosis   . Breast fibrocystic disorder   . Mitral valve prolapse   . Breast pain     right  . Normal stress echocardiogram Jan 2011  . Normal nuclear stress test December 2012  . Normal echocardiogram December 2012    EF 55 to 60%; mild MVP, grade 1 diastolic dysfunction, PAP 32    Past Surgical History  Procedure Date  . Tubal ligation   . Breast lumpectomy 01/12/2011    right    Family History  Problem Relation Age of Onset  . Cardiomyopathy Mother   . Breast cancer Mother   . Heart disease Maternal Aunt   . Prostate cancer Father   . Colon cancer Neg Hx     History  Substance Use Topics  . Smoking status: Never Smoker   . Smokeless tobacco: Never Used  . Alcohol Use: No    OB History    Grav Para Term Preterm Abortions TAB SAB Ect Mult Living                  Review of Systems  Gastrointestinal: Positive for vomiting.  All other systems reviewed and are negative.    Allergies  Benadryl and Codeine  Home Medications   Current Outpatient Rx  Name  Route  Sig  Dispense  Refill  . ALPRAZOLAM 0.5 MG PO TABS   Oral   Take 0.5 mg by mouth 3 (three) times daily as  needed. Take 1/2 to 1 tablet by mouth three times daily as needed for anxiety         . ESOMEPRAZOLE MAGNESIUM 40 MG PO CPDR   Oral   Take 40 mg by mouth daily before breakfast.         . PROPRANOLOL HCL ER 80 MG PO CP24   Oral   Take 80 mg by mouth 2 (two) times daily.         Marland Kitchen ONDANSETRON HCL 4 MG PO TABS   Oral   Take 1 tablet (4 mg total) by mouth every 6 (six) hours.   12 tablet   0     BP 109/63  Pulse 98  Temp 98.9 F (37.2 C) (Oral)  Resp 18  Ht 5\' 10"  (1.778 m)  Wt 180 lb (81.647 kg)  BMI 25.83 kg/m2  SpO2 98%  LMP 08/04/2012  Physical Exam  Nursing note and vitals reviewed. Constitutional: She is oriented to person, place, and time. She appears well-developed and well-nourished.  HENT:  Head: Normocephalic and atraumatic.  Eyes: Conjunctivae normal and EOM are normal. Pupils are equal, round, and reactive to light.  Neck: Normal range of motion and phonation normal. Neck  supple.  Cardiovascular: Normal rate, regular rhythm and intact distal pulses.   Pulmonary/Chest: Effort normal and breath sounds normal. She exhibits no tenderness.  Abdominal: Soft. She exhibits no distension. There is tenderness (mild epigastric). There is no guarding.  Musculoskeletal: Normal range of motion.  Neurological: She is alert and oriented to person, place, and time. She has normal strength. She exhibits normal muscle tone.  Skin: Skin is warm and dry.  Psychiatric: She has a normal mood and affect. Her behavior is normal. Judgment and thought content normal.    ED Course  Procedures (including critical care time)   Emrgency department treatment: IV fluid bolus, and drip.  Screening labs, I Stat 8     Labs Reviewed  POCT I-STAT, CHEM 8 - Abnormal; Notable for the following:    Hemoglobin 11.2 (*)     HCT 33.0 (*)     All other components within normal limits  URINALYSIS, ROUTINE W REFLEX MICROSCOPIC - Abnormal; Notable for the following:    APPearance HAZY  (*)     Hgb urine dipstick SMALL (*)     Protein, ur TRACE (*)     Nitrite POSITIVE (*)     Leukocytes, UA SMALL (*)     All other components within normal limits  URINE MICROSCOPIC-ADD ON - Abnormal; Notable for the following:    Squamous Epithelial / LPF FEW (*)     Bacteria, UA MANY (*)     All other components within normal limits  URINE CULTURE   No results found.   1. Gastroenteritis   2. Headache       MDM  Evaluation consistent with gastroenteritis.  Treatment initiated in the emergency department.  Doubt metabolic instability, serious bacterial infection or impending vascular collapse; the patient is stable for discharge.      Plan: Home Medications- Zofran Treatments- gradually advance diet; Recommended follow up- PCP, when necessary   Flint Melter, MD 08/21/12 1539

## 2012-08-18 NOTE — ED Notes (Signed)
Patient c/o headache at this time and has requested that she be checked for a UTI.  Dr. Effie Shy aware and order received to collect urine.

## 2012-08-19 MED ORDER — PANTOPRAZOLE SODIUM 40 MG PO TBEC
DELAYED_RELEASE_TABLET | ORAL | Status: AC
  Filled 2012-08-19: qty 1

## 2012-08-19 NOTE — ED Notes (Signed)
Discharge instructions given and reviewed with patient.  Prescription given for Zofran; effects and use explained.  Patient verbalized understanding to maintain clear liquid diet and to take Zofran as directed.  Patient discharged to home in good condition.

## 2012-08-21 LAB — URINE CULTURE: Colony Count: >=100000

## 2012-08-22 NOTE — ED Notes (Signed)
Positive urnc- chart sent to edp office for review.  

## 2012-08-24 ENCOUNTER — Telehealth (HOSPITAL_COMMUNITY): Payer: Self-pay | Admitting: Emergency Medicine

## 2012-08-24 NOTE — ED Notes (Signed)
Chart returned from EDP office. Per Madelaine Bhat PA-C, start patient on Cipro 500 mg 1 po bid for 3 days.

## 2012-08-24 NOTE — ED Notes (Signed)
Rx called in to [facility] by Norm Parcel PFM.

## 2012-09-02 ENCOUNTER — Emergency Department (HOSPITAL_COMMUNITY)
Admission: EM | Admit: 2012-09-02 | Discharge: 2012-09-03 | Disposition: A | Discharge status: Home / Self Care | Payer: 59 | Attending: Emergency Medicine | Admitting: Emergency Medicine

## 2012-09-02 ENCOUNTER — Encounter (HOSPITAL_COMMUNITY): Payer: Self-pay

## 2012-09-02 ENCOUNTER — Emergency Department (HOSPITAL_COMMUNITY): Payer: 59

## 2012-09-02 DIAGNOSIS — R0602 Shortness of breath: Secondary | ICD-10-CM | POA: Insufficient documentation

## 2012-09-02 DIAGNOSIS — Z8742 Personal history of other diseases of the female genital tract: Secondary | ICD-10-CM | POA: Insufficient documentation

## 2012-09-02 DIAGNOSIS — Z8619 Personal history of other infectious and parasitic diseases: Secondary | ICD-10-CM | POA: Insufficient documentation

## 2012-09-02 DIAGNOSIS — F411 Generalized anxiety disorder: Secondary | ICD-10-CM | POA: Insufficient documentation

## 2012-09-02 DIAGNOSIS — Z9889 Other specified postprocedural states: Secondary | ICD-10-CM | POA: Insufficient documentation

## 2012-09-02 DIAGNOSIS — Z3202 Encounter for pregnancy test, result negative: Secondary | ICD-10-CM | POA: Insufficient documentation

## 2012-09-02 DIAGNOSIS — Z8679 Personal history of other diseases of the circulatory system: Secondary | ICD-10-CM | POA: Insufficient documentation

## 2012-09-02 DIAGNOSIS — R0789 Other chest pain: Secondary | ICD-10-CM | POA: Insufficient documentation

## 2012-09-02 DIAGNOSIS — R079 Chest pain, unspecified: Secondary | ICD-10-CM

## 2012-09-02 DIAGNOSIS — Z79899 Other long term (current) drug therapy: Secondary | ICD-10-CM | POA: Insufficient documentation

## 2012-09-02 LAB — COMPREHENSIVE METABOLIC PANEL
ALT: 15 U/L (ref 0–35)
AST: 17 U/L (ref 0–37)
Albumin: 3.6 g/dL (ref 3.5–5.2)
Alkaline Phosphatase: 75 U/L (ref 39–117)
BUN: 13 mg/dL (ref 6–23)
CO2: 24 mEq/L (ref 19–32)
Calcium: 9.7 mg/dL (ref 8.4–10.5)
Chloride: 100 mEq/L (ref 96–112)
Creatinine, Ser: 0.84 mg/dL (ref 0.50–1.10)
GFR calc Af Amer: >90 mL/min (ref >90)
GFR calc non Af Amer: 80 mL/min — ABNORMAL LOW (ref >90)
Glucose, Bld: 88 mg/dL (ref 70–99)
Potassium: 3.5 mEq/L (ref 3.5–5.1)
Sodium: 134 mEq/L — ABNORMAL LOW (ref 135–145)
Total Bilirubin: 0.2 mg/dL — ABNORMAL LOW (ref 0.3–1.2)
Total Protein: 7.7 g/dL (ref 6.0–8.3)

## 2012-09-02 LAB — CBC
HCT: 35.2 % — ABNORMAL LOW (ref 36.0–46.0)
Hemoglobin: 11.9 g/dL — ABNORMAL LOW (ref 12.0–15.0)
MCH: 27.8 pg (ref 26.0–34.0)
MCHC: 33.8 g/dL (ref 30.0–36.0)
MCV: 82.2 fL (ref 78.0–100.0)
Platelets: 347 10*3/uL (ref 150–400)
RBC: 4.28 MIL/uL (ref 3.87–5.11)
RDW: 14.9 % (ref 11.5–15.5)
WBC: 6.7 10*3/uL (ref 4.0–10.5)

## 2012-09-02 LAB — POCT I-STAT TROPONIN I: Troponin i, poc: 0.01 ng/mL (ref 0.00–0.08)

## 2012-09-02 LAB — PROTIME-INR
INR: 1.01 (ref 0.00–1.49)
Prothrombin Time: 13.2 seconds (ref 11.6–15.2)

## 2012-09-02 LAB — POCT PREGNANCY, URINE: Preg Test, Ur: NEGATIVE

## 2012-09-02 LAB — TROPONIN I: Troponin I: <0.3 ng/mL (ref <0.30)

## 2012-09-02 LAB — APTT: aPTT: 34 seconds (ref 24–37)

## 2012-09-02 LAB — D-DIMER, QUANTITATIVE: D-Dimer, Quant: 0.36 ug/mL-FEU (ref 0.00–0.48)

## 2012-09-02 MED ORDER — MORPHINE SULFATE 4 MG/ML IJ SOLN
4.0000 mg | Freq: Once | INTRAMUSCULAR | Status: AC
  Administered 2012-09-02: 4 mg via INTRAVENOUS

## 2012-09-02 MED ORDER — HYDROCODONE-ACETAMINOPHEN 5-325 MG PO TABS: 1.0000 | ORAL_TABLET | Freq: Four times a day (QID) | ORAL | Status: DC | PRN

## 2012-09-02 MED ORDER — ACETAMINOPHEN 325 MG PO TABS
ORAL_TABLET | ORAL | Status: AC
  Administered 2012-09-02: 650 mg via ORAL
  Filled 2012-09-02: qty 2

## 2012-09-02 MED ORDER — MORPHINE SULFATE 4 MG/ML IJ SOLN
4.0000 mg | Freq: Once | INTRAMUSCULAR | Status: AC
  Administered 2012-09-02: 2 mg via INTRAVENOUS
  Filled 2012-09-02 (×2): qty 1

## 2012-09-02 MED ORDER — ONDANSETRON HCL 4 MG/2ML IJ SOLN
4.0000 mg | Freq: Once | INTRAMUSCULAR | Status: AC
  Administered 2012-09-02: 4 mg via INTRAVENOUS
  Filled 2012-09-02: qty 2

## 2012-09-02 MED ORDER — MORPHINE SULFATE 4 MG/ML IJ SOLN
INTRAMUSCULAR | Status: AC
  Filled 2012-09-02: qty 1

## 2012-09-02 MED ORDER — SODIUM CHLORIDE 0.9 % IV SOLN
1000.0000 mL | INTRAVENOUS | Status: DC
  Administered 2012-09-02: 22:00:00 via INTRAVENOUS

## 2012-09-02 MED ORDER — ACETAMINOPHEN 325 MG PO TABS
650.0000 mg | ORAL_TABLET | Freq: Once | ORAL | Status: AC
  Administered 2012-09-02: 650 mg via ORAL

## 2012-09-02 NOTE — ED Provider Notes (Addendum)
History   This chart was scribed for Kathleen Kras, MD by Donne Anon, ED Scribe. This patient was seen in room APA05/APA05 and the patient's care was started at 2117.  CSN: [id number] Arrival date & time 09/02/12  2103 First MD Initiated Contact with Patient 09/02/12 2117    Chief Complaint  Patient presents with  . Chest Pain  . Shortness of Breath    The history is provided by the patient. No language interpreter was used.   Kathleen [name] is a 52 y.o. female who presents to the Emergency Department complaining of gradual onset, intermittent (several times a week) moderate chest pain that radiates down her right arm and back and began a few months ago. She reports she has had similar episodes in the past that usually subside with OTC medication, but that tonight the pain feels different. She is currently seeing a doctor about this CP who is running ongoing tests. She reports associated SOB and nausea. She denies emesis or any other pain. She has a family history of DVT, MI, breast cancer.  Past Medical History  Diagnosis Date  . Sarcoidosis   . Breast fibrocystic disorder   . Mitral valve prolapse   . Breast pain     right  . Normal stress echocardiogram Jan 2011  . Normal nuclear stress test December 2012  . Normal echocardiogram December 2012    EF 55 to 60%; mild MVP, grade 1 diastolic dysfunction, PAP 32    Past Surgical History  Procedure Date  . Tubal ligation   . Breast lumpectomy 01/12/2011    right    Family History  Problem Relation Age of Onset  . Cardiomyopathy Mother   . Breast cancer Mother   . Heart disease Maternal Aunt   . Prostate cancer Father   . Colon cancer Neg Hx     History  Substance Use Topics  . Smoking status: Never Smoker   . Smokeless tobacco: Never Used  . Alcohol Use: No    OB History    Grav Para Term Preterm Abortions TAB SAB Ect Mult Living                  Review of Systems  All other systems reviewed and are  negative.   10 Systems reviewed and all are negative for acute change except as noted in the HPI.   Allergies  Benadryl and Codeine  Home Medications   Current Outpatient Rx  Name  Route  Sig  Dispense  Refill  . ESOMEPRAZOLE MAGNESIUM 40 MG PO CPDR   Oral   Take 40 mg by mouth daily before breakfast.         . PROPRANOLOL HCL ER 80 MG PO CP24   Oral   Take 80 mg by mouth 2 (two) times daily.         Marland Kitchen ALPRAZOLAM 0.5 MG PO TABS   Oral   Take 0.5 mg by mouth daily as needed. Take 1/2 to 1 tablet by mouth three times daily as needed for anxiety           LMP 08/04/2012  Physical Exam  Nursing note and vitals reviewed. Constitutional: She appears well-developed and well-nourished. No distress.       Anxious  HENT:  Head: Normocephalic and atraumatic.  Right Ear: External ear normal.  Left Ear: External ear normal.  Eyes: Conjunctivae normal are normal. Right eye exhibits no discharge. Left eye exhibits no discharge. No scleral icterus.  Neck: Neck supple. No tracheal deviation present.  Cardiovascular: Normal rate, regular rhythm and intact distal pulses.   Pulmonary/Chest: Effort normal and breath sounds normal. No stridor. No respiratory distress. She has no wheezes. She has no rales.  Abdominal: Soft. Bowel sounds are normal. She exhibits no distension. There is no tenderness. There is no rebound and no guarding.  Musculoskeletal: She exhibits no edema and no tenderness.  Neurological: She is alert. She has normal strength. No sensory deficit. Cranial nerve deficit:  no gross defecits noted. She exhibits normal muscle tone. She displays no seizure activity. Coordination normal.  Skin: Skin is warm and dry. No rash noted.  Psychiatric: She has a normal mood and affect.    ED Course  Procedures (including critical care time) DIAGNOSTIC STUDIES: Oxygen Saturation is 98% on room air, normal by my interpretation.    COORDINATION OF CARE: 9:21 PM Discussed  treatment plan which includes chest xray and medication with pt at bedside and pt agreed to plan.  EKG Rate 62 Sinus rhythm with Premature atrial complexes Nonspecific T wave abnormality Abnormal ECG When compared with ECG of 02-Sep-2012 21:12, Premature atrial complexes are now Present, nonspecific t wave changes are similar  11:18 PM patient initially refused any pain medications. She just wanted a Tylenol. Patient later asked for additional pain medications because the Tylenol did not help. Patient requested just 2 mg of morphine.  The patient was then given 4 mg morphine because she did not have relief with 2 mg morphine  Labs Reviewed  CBC - Abnormal; Notable for the following:    Hemoglobin 11.9 (*)     HCT 35.2 (*)     All other components within normal limits  COMPREHENSIVE METABOLIC PANEL - Abnormal; Notable for the following:    Sodium 134 (*)     Total Bilirubin 0.2 (*)     GFR calc non Af Amer 80 (*)     All other components within normal limits  PROTIME-INR  APTT  D-DIMER, QUANTITATIVE  POCT I-STAT TROPONIN I  POCT PREGNANCY, URINE  TROPONIN I   Dg Chest 2 View  09/02/2012  *RADIOLOGY REPORT*  Clinical Data: Sarcoidosis.  Chest pain and shortness of breath.  CHEST - 2 VIEW  Comparison: CT and plain films of the chest 08/24/2011.  Findings: Chronic interstitial lung disease is again seen.  No new airspace opacity is identified.  Heart size is normal.  No pneumothorax or pleural fluid.  IMPRESSION: No change in chronic interstitial lung disease in patient with sarcoidosis.  No acute abnormality.   Original Report Authenticated By: Holley Dexter, M.D.       MDM  Pt complains of recurrent severe chest pain.  She has been evaluated in the past for this.  Normal echocardiogram in Dec 2012.  Nl nuclear stress test in nov 2012.  Symptoms atypical for cardiac.  Doubt ACS.  Low risk for PE.  Negative d dimer.  Doubt PE  Patient states she has been having persistent  shortness of breath for months. Patient has previously seen [facility] pulmonology specifically Dr. Sherene Sires.   Notes dated September 2013 describes the patient having chronic shortness of breath even back as far as February 2013. Notes indicate that she complained of constant shortness of breath at that time.  Overall these symptoms have been chronic and recurrent. Although this evening this attack is worse per the patient. I do not feel there is evidence to suggest any acute emergency medical condition.  I recommended  the patient follow up with her primary Dr. and Dr. Sherene Sires to investigate further the cause of her symptoms. The patient apparently has been diagnosed with sarcoidosis in the past and this certainly could be a contributing factor.   I personally performed the services described in this documentation, which was scribed in my presence. The recorded information has been reviewed and is accurate.       Kathleen Kras, MD 09/02/12 1610  Kathleen Kras, MD 09/02/12 [phone]

## 2012-09-02 NOTE — ED Notes (Signed)
Pt has history of chest pain similar to this tonight but is much worse.  Pt has been evaluated for same, had just finished eating supper tonight when pain worsened, relates as sharp pain on her right side that radiates around to her back, also has sob

## 2012-09-04 ENCOUNTER — Other Ambulatory Visit: Payer: Self-pay | Admitting: Family Medicine

## 2012-09-04 DIAGNOSIS — R0609 Other forms of dyspnea: Secondary | ICD-10-CM

## 2012-09-04 DIAGNOSIS — R1011 Right upper quadrant pain: Secondary | ICD-10-CM

## 2012-09-04 DIAGNOSIS — R0600 Dyspnea, unspecified: Secondary | ICD-10-CM

## 2012-09-11 ENCOUNTER — Ambulatory Visit (HOSPITAL_COMMUNITY)
Admission: RE | Admit: 2012-09-11 | Discharge: 2012-09-11 | Disposition: A | Discharge status: Home / Self Care | Payer: 59 | Source: Ambulatory Visit | Attending: Family Medicine | Admitting: Family Medicine

## 2012-09-11 ENCOUNTER — Other Ambulatory Visit: Payer: Self-pay | Admitting: Family Medicine

## 2012-09-11 DIAGNOSIS — R0609 Other forms of dyspnea: Secondary | ICD-10-CM | POA: Insufficient documentation

## 2012-09-11 DIAGNOSIS — K769 Liver disease, unspecified: Secondary | ICD-10-CM | POA: Insufficient documentation

## 2012-09-11 DIAGNOSIS — R1011 Right upper quadrant pain: Secondary | ICD-10-CM | POA: Insufficient documentation

## 2012-09-11 DIAGNOSIS — R0600 Dyspnea, unspecified: Secondary | ICD-10-CM

## 2012-09-11 DIAGNOSIS — R0989 Other specified symptoms and signs involving the circulatory and respiratory systems: Secondary | ICD-10-CM | POA: Insufficient documentation

## 2012-09-11 MED ORDER — IOHEXOL 350 MG/ML SOLN
100.0000 mL | Freq: Once | INTRAVENOUS | Status: AC | PRN
  Administered 2012-09-11: 100 mL via INTRAVENOUS

## 2012-09-11 MED ORDER — IOHEXOL 300 MG/ML  SOLN: 80.0000 mL | Freq: Once | INTRAMUSCULAR | Status: AC | PRN

## 2012-09-12 ENCOUNTER — Other Ambulatory Visit: Payer: Self-pay | Admitting: Family Medicine

## 2012-09-12 DIAGNOSIS — Z09 Encounter for follow-up examination after completed treatment for conditions other than malignant neoplasm: Secondary | ICD-10-CM

## 2012-09-16 ENCOUNTER — Ambulatory Visit (HOSPITAL_COMMUNITY)
Admission: RE | Admit: 2012-09-16 | Discharge: 2012-09-16 | Disposition: A | Discharge status: Home / Self Care | Payer: 59 | Source: Ambulatory Visit | Attending: Family Medicine | Admitting: Family Medicine

## 2012-09-16 DIAGNOSIS — Z09 Encounter for follow-up examination after completed treatment for conditions other than malignant neoplasm: Secondary | ICD-10-CM

## 2012-09-16 DIAGNOSIS — K769 Liver disease, unspecified: Secondary | ICD-10-CM | POA: Insufficient documentation

## 2012-09-16 MED ORDER — GADOBENATE DIMEGLUMINE 529 MG/ML IV SOLN
15.0000 mL | Freq: Once | INTRAVENOUS | Status: AC | PRN
  Administered 2012-09-16: 15 mL via INTRAVENOUS

## 2012-09-16 NOTE — Progress Notes (Signed)
Blood sample obtained from left arm IV for Creatnine level.  

## 2012-11-12 ENCOUNTER — Encounter: Payer: Self-pay | Admitting: Family Medicine

## 2012-11-12 ENCOUNTER — Ambulatory Visit (INDEPENDENT_AMBULATORY_CARE_PROVIDER_SITE_OTHER): Payer: 59 | Admitting: Family Medicine

## 2012-11-12 VITALS — BP 112/72 | Temp 98.2°F | Wt 193.0 lb

## 2012-11-12 DIAGNOSIS — D869 Sarcoidosis, unspecified: Secondary | ICD-10-CM

## 2012-11-12 DIAGNOSIS — J322 Chronic ethmoidal sinusitis: Secondary | ICD-10-CM

## 2012-11-12 DIAGNOSIS — G609 Hereditary and idiopathic neuropathy, unspecified: Secondary | ICD-10-CM

## 2012-11-12 DIAGNOSIS — M25512 Pain in left shoulder: Secondary | ICD-10-CM

## 2012-11-12 DIAGNOSIS — M25519 Pain in unspecified shoulder: Secondary | ICD-10-CM

## 2012-11-12 MED ORDER — LEVOFLOXACIN 500 MG PO TABS: 500.0000 mg | ORAL_TABLET | Freq: Every day | ORAL | Status: AC

## 2012-11-12 MED ORDER — DICLOFENAC SODIUM 75 MG PO TBEC: 75.0000 mg | DELAYED_RELEASE_TABLET | Freq: Two times a day (BID) | ORAL | Status: DC

## 2012-11-12 NOTE — Patient Instructions (Signed)
Make sure that you follow up in one month's time. Try anti-inflammatory for your shoulder along with range of motion exercises. Hopefully your her lung biopsy will have good results. Make sure that the facilities sends [place] a copy. Thank you

## 2012-11-12 NOTE — Progress Notes (Signed)
Subjective:    Patient ID: Kathleen [name], female    DOB: 1960/09/03, 52 y.o.   MRN: [id number]  Sinusitis This is a new problem. The current episode started in the past 7 days. The problem has been gradually worsening since onset. There has been no fever. The fever has been present for less than 1 day. Her pain is at a severity of 3/10. The pain is mild. Associated symptoms include chills, congestion and coughing. Pertinent negatives include no diaphoresis, ear pain, headaches, hoarse voice or neck pain. Past treatments include acetaminophen. The treatment provided no relief.    Should be noted that the sinus symptoms are gradually getting worse and she is concerned about this. She had a pulmonary biopsy earlier this week for her sarcoidosis. She has not heard results yet. She is still using her special medications and she was told by the specialist that it could take up to 6 months before seen significant improvement in her breathing she is uncertain it should be able to return to work over the course of next 6 months. In addition to this she is very worried that she still has pulmonary fibrosis even though the specialist reassured her that he did not feel she was she also states today that her hands and her feet become blue at times and this doesn't seem to have any rhyme or reason to it. There is no triggering factors it'll last for a few minutes then it goes away. She also states that in the morning times when she gets up she has numbness in her feet it is very painful to stand on the numbness sometimes goes away sometimes doesn't she cannot explain the numbness she denies any numbness radiating down the legs. Her symptoms are difficult to pinpoint.  Past medical history was reviewed with the patient and social history as well. Patient does not smoke. She is currently out on disability. There is hoped that she'll return to work somewhere in the next couple months but what the specialist told her puts  this in some doubt. I told the patient that if she is given need to be taken out of work for 6 months based on his opinion he will need to issue a letter stating that.  Review of Systems  Constitutional: Positive for chills. Negative for diaphoresis.  HENT: Positive for congestion. Negative for ear pain, hoarse voice and neck pain.   Respiratory: Positive for cough.   Neurological: Negative for headaches.   Patient also complains of left shoulder pain discomfort over the past few weeks with stiffness decreased range of motion no known injury to it nothing seems to make it better nothing seems to make it worse she hasn't really tried anything for it. She's never had this problem before. No known injury.    Objective:   Physical Exam  Constitutional: She appears well-developed.  HENT:  Head: Normocephalic.  Right Ear: External ear normal.  Left Ear: External ear normal.  There is moderate sinus tenderness in the frontal sinuses  Neck: Normal range of motion. No thyromegaly present.  Cardiovascular: Normal rate and regular rhythm.   Pulmonary/Chest: Effort normal and breath sounds normal. No respiratory distress.  Patient complains of feeling short of breath overall she is moving air fairly well. Reassurance given that I don't feel her sarcoidosis is flaring up.  Abdominal: Soft. She exhibits no distension. There is no tenderness.  Musculoskeletal: She exhibits no edema.  Lymphadenopathy:    She has cervical adenopathy.  On physical exam she does have some stiffness of the left shoulder with decreased range of motion it is difficult to ascertain strength.range of motion exercises were shown to the patient she is to do this over the next several weeks back with the anti-inflammatory should make this better      Assessment & Plan:  Pain in joint, shoulder region, left - Plan: diclofenac (VOLTAREN) 75 MG EC tablet  Unspecified hereditary and idiopathic peripheral neuropathy - Plan:  Ambulatory referral to Neurology  Ethmoid sinusitis - Plan: levofloxacin (LEVAQUIN) 500 MG tablet  Sarcoidosis  Range of motion exercises I believe it was about taking nerve conduction studies by the neurologist to help figure out if she truly has peripheral neuropathy she also relates that time cyanosis in the hands and feet which raises the possibility of Raynaud syndrome.I believe it is important for this patient to take pictures of when this occurs so we can see the severity of the cyanosis. The pulses in her feet and her wrists were normal.  Currently she is disabled she will be going through some pulmonary rehabilitation. It will be up to the pulmonary specialist regarding if she needs to stay out for 6 months.

## 2012-11-26 ENCOUNTER — Encounter: Payer: Self-pay | Admitting: Neurology

## 2012-11-26 ENCOUNTER — Ambulatory Visit (INDEPENDENT_AMBULATORY_CARE_PROVIDER_SITE_OTHER): Payer: 59 | Admitting: Neurology

## 2012-11-26 ENCOUNTER — Telehealth: Payer: Self-pay | Admitting: Family Medicine

## 2012-11-26 VITALS — BP 110/78 | HR 68 | Ht 69.75 in | Wt 192.0 lb

## 2012-11-26 DIAGNOSIS — M79609 Pain in unspecified limb: Secondary | ICD-10-CM

## 2012-11-26 DIAGNOSIS — R209 Unspecified disturbances of skin sensation: Secondary | ICD-10-CM | POA: Insufficient documentation

## 2012-11-26 NOTE — Telephone Encounter (Signed)
Patient was seen by Dr. Anne Hahn this morning.  Called and is not happy with Dr. Anne Hahn or the office.  States Dr. Anne Hahn had no time for the patient.  She is extremely upset that she was sent to this Doctor.  Patient wants to be seen by Dr. Barnett Abu.  Also stated that she wants our practice to call and cancel all future appointments with Dr. Anne Hahn or any appointments pertaining to Dr. Anne Hahn.  Wants someone to please call her as soon as possible regarding this appointment and an a new referral.

## 2012-11-26 NOTE — Patient Instructions (Signed)
  We will check and X-ray of the left shoulder, and get blood work and a nerve test on the arms and leg.

## 2012-11-26 NOTE — Progress Notes (Signed)
Reason for visit: Numbness  Kathleen [name] is a 52 y.o. female  History of present illness:  Kathleen [name] is a 52 year old right-handed white female with a history of numbness in the feet and hands dating back approximately 2 years. The patient indicates that over the last 2 years, the numbness sensations have worsened. The numbness in the legs may occasionally go up to midcalf level, and the numbness is usually present in the feet only. The patient indicates that her feet hurt all the time, but that pain is worse when she is weightbearing. The patient is able to rest well at night, but as soon as her feet hit the floor, the pain worsens. The patient also has noted some left shoulder discomfort that is worse with abduction of the arm. The shoulder pain is better when she is resting her arm. The patient does note some problems with balance, but she denies any falls. The patient has some constipation issues, but she denies any problems controlling the bladder. The patient has a history of fibromyalgia. The patient was recently worked up for shortness of breath and chest pain, and she was found to have pulmonary fibrosis. The patient is on prednisone and methotrexate at this time. The patient just started the prednisone within the last week. She indicates that this has not helped her feet and left shoulder pain. The patient also notes some problems with word finding, and she has difficulty with speech at times. The patient denies any memory issues. The patient denies any neck or low back pain, or pain radiating down the arms or legs. The patient is sent to this office for an evaluation.  Past Medical History  Diagnosis Date  . Sarcoidosis   . Breast fibrocystic disorder   . Mitral valve prolapse   . Breast pain     right  . Normal stress echocardiogram Jan 2011  . Normal nuclear stress test December 2012  . Normal echocardiogram December 2012    EF 55 to 60%; mild MVP, grade 1 diastolic dysfunction,  PAP 32  . Fibromyalgia   . DOE (dyspnea on exertion)   . Pulmonary fibrosis   . Gastroesophageal reflux disease     Past Surgical History  Procedure Laterality Date  . Tubal ligation    . Breast lumpectomy  01/12/2011    right    Family History  Problem Relation Age of Onset  . Cardiomyopathy Mother   . Breast cancer Mother   . Heart disease Maternal Aunt   . Prostate cancer Father   . Colon cancer Neg Hx   . Neurofibromatosis Brother     Social history:  reports that she has never smoked. She has never used smokeless tobacco. She reports that she does not drink alcohol or use illicit drugs.  Medications:  Current Outpatient Prescriptions on File Prior to Visit  Medication Sig Dispense Refill  . ALPRAZolam (XANAX) 0.5 MG tablet Take 0.5 mg by mouth daily as needed. Take 1/2 to 1 tablet by mouth three times daily as needed for anxiety      . diclofenac (VOLTAREN) 75 MG EC tablet Take 1 tablet (75 mg total) by mouth 2 (two) times daily.  60 tablet  1  . DULoxetine (CYMBALTA) 30 MG capsule Take 30 mg by mouth 2 (two) times daily.      Marland Kitchen esomeprazole (NEXIUM) 40 MG capsule Take 40 mg by mouth daily before breakfast.      . famotidine (PEPCID) 20 MG tablet Take  20 mg by mouth daily.      Marland Kitchen ibuprofen (ADVIL,MOTRIN) 200 MG tablet Take 200 mg by mouth every 6 (six) hours as needed for pain.      Marland Kitchen ondansetron (ZOFRAN) 4 MG tablet Take 4 mg by mouth every 8 (eight) hours as needed for nausea.      . polycarbophil (FIBERCON) 625 MG tablet Take 625 mg by mouth daily.       No current facility-administered medications on file prior to visit.    Allergies:  Allergies  Allergen Reactions  . Bee Venom   . Benadryl (Diphenhydramine Hcl)     "makes my throat close up"  . Codeine Itching    ROS:  Out of a complete 14 system review of symptoms, the patient complains only of the following symptoms, and all other reviewed systems are negative.  Weight gain Chest pain Shortness of  breath, cough Constipation Joint pain, achy muscles Numbness, weakness, slurred speech, tremor Anxiety, decreased energy  Blood pressure 110/78, pulse 68, height 5' 9.75" (1.772 m), weight 192 lb (87.091 kg).  Physical Exam  General: The patient is alert and cooperative at the time of the examination. The patient is minimally obese.  Head: Pupils are equal, round, and reactive to light. Discs are flat bilaterally.  Neck: The neck is supple, no carotid bruits are noted.  Respiratory: The respiratory examination is clear.  Cardiovascular: The cardiovascular examination reveals a regular rate and rhythm, no obvious murmurs or rubs are noted.  Skin: Extremities are without significant edema.  Neurologic Exam  Mental status:  Cranial nerves: Facial symmetry is present. There is good sensation of the face to pinprick and soft touch bilaterally. The strength of the facial muscles and the muscles to head turning and shoulder shrug are normal bilaterally. Speech is well enunciated, no aphasia or dysarthria is noted. Extraocular movements are full. Visual fields are full.  Motor: The motor testing reveals 5 over 5 strength of all 4 extremities. Good symmetric motor tone is noted throughout. The patient has some discomfort with abduction of the left arm and with external rotation of the left arm.  Sensory: Sensory testing is intact to pinprick, soft touch, vibration sensation, and position sense on all 4 extremities. No evidence of extinction is noted.  Coordination: Cerebellar testing reveals good finger-nose-finger and heel-to-shin bilaterally.  Gait and station: Gait is normal. Tandem gait is normal. Romberg is negative. No drift is seen  Reflexes: Deep tendon reflexes are symmetric bilaterally, but the ankle jerk reflexes are depressed bilaterally. Toes are downgoing bilaterally.   Assessment/Plan:  1. Numbness of the extremities, rule out neuropathy  2. Fibromyalgia  3. Left  shoulder pain, probable intrinsic shoulder disease  The patient will be sent for an x-ray of the left shoulder. Nerve conduction studies will be done on all 4 extremities, and EMG evaluation will be done on both legs. The patient will have blood work done today. If the above workup is completely unremarkable, MRI of the cervical spine and brain will be done in the future. The patient reports pain in the feet with weightbearing mainly, and the patient may require a referral to a podiatrist in the future to exclude the possibility of plantar fasciitis or arthritis involving the feet. The patient is on Cymbalta with minimal benefit.  Marlan Palau MD 11/26/2012 8:41 PM  [facility] [address] [place], [place] [address]  Phone [phone] Fax [phone]

## 2012-11-27 ENCOUNTER — Telehealth: Payer: Self-pay | Admitting: *Deleted

## 2012-11-27 NOTE — Telephone Encounter (Signed)
Pt states she saw you for numbness and tingling in hands and feet.  Wants referral to [facility] or someone in his group bc she didn't like dr. Anne Hahn. And her family sees that group.

## 2012-11-27 NOTE — Telephone Encounter (Signed)
First, I am not certain why she saw Dr. Anne Hahn. In a kind way please ask for what Dr. Anne Hahn all her for.  Secondly, she does not had to see Dr. Anne Hahn again. She can take it upon herself to go ahead and called their office and cancel any future appointments.  Third, we can referred to Dr. Danielle Dess but I need more to go on Y. She would like to be seen by them.

## 2012-11-27 NOTE — Telephone Encounter (Signed)
The other partners in the groove may not see her. We will have [place] ask. If they will not see her in next that would be a [employer]. Such as [facility]  Please talk with patient and then forward message to referrals

## 2012-11-28 NOTE — Telephone Encounter (Signed)
[facility] 

## 2012-12-01 ENCOUNTER — Telehealth: Payer: Self-pay | Admitting: Internal Medicine

## 2012-12-01 ENCOUNTER — Other Ambulatory Visit: Payer: Self-pay | Admitting: Family Medicine

## 2012-12-01 DIAGNOSIS — R202 Paresthesia of skin: Secondary | ICD-10-CM

## 2012-12-01 NOTE — Telephone Encounter (Signed)
Patient called with request for all medical records in the [facility] system by today for a doctor visit tomorrow 12/02/2012. Call transferred to me after patient becoming irate.   Miss Hawkey explained she got all her records a month ago from [place], when I questioned her she only received records from Cardiology. She said she gave them to [facility] and now wants all her [facility] Records to pick up.  Patient does not have a current authorization on file. Offered her last three office visit notes and asked if I could accommodate her to get an authorization to her. I explained the process we have in place to ensure her record request would be a complete disclosure. She did not want a form nor did she want the last three visits. She repeated she wants all records today she has in the [facility] System because MyChart is not all her records! I explained that application is only an abstract.   I asked her if I could send her an authorization because she does not want the records sent directly to the provider she wants them in her hands. She responded NO I'll get them some other way and hung up. rmf

## 2012-12-01 NOTE — Telephone Encounter (Signed)
Spoke with patient, she chose [facility], wants appt ASAP, explained would take a couple days at least, please initiate referral in Epic so I may proceed

## 2012-12-05 ENCOUNTER — Telehealth: Payer: Self-pay | Admitting: Family Medicine

## 2012-12-05 LAB — TSH: TSH: 7.35 u[IU]/mL — ABNORMAL HIGH (ref 0.450–4.500)

## 2012-12-05 LAB — PE (RFX IFE), S
A/G Ratio: 1.3 (ref 0.7–2.0)
Albumin ELP: 3.8 g/dL (ref 3.2–5.6)
Alpha 1: 0.2 g/dL (ref 0.1–0.4)
Alpha 2: 0.6 g/dL (ref 0.4–1.2)
Beta: 0.9 g/dL (ref 0.6–1.3)
Gamma Globulin: 1.3 g/dL (ref 0.5–1.6)
Globulin, Total: 3 g/dL (ref 2.0–4.5)
Total Protein: 6.8 g/dL (ref 6.0–8.5)

## 2012-12-05 LAB — RHEUMATOID FACTOR: Rhuematoid fact SerPl-aCnc: 9.1 IU/mL (ref 0.0–13.9)

## 2012-12-05 LAB — ANA: Anti Nuclear Antibody(ANA): NEGATIVE

## 2012-12-05 LAB — VITAMIN B12: Vitamin B-12: 477 pg/mL (ref 211–946)

## 2012-12-05 LAB — ANGIOTENSIN CONVERTING ENZYME: Angio Convert Enzyme: 50 U/L (ref 14–82)

## 2012-12-05 NOTE — Telephone Encounter (Signed)
Pt LMOVM for me, ? Status of referral to [facility].  Called them this morning to check, is in doctor review, they should be calling her mid to late next week to schedule.  Tried to call pt back to inform and she's got voice mail that's not set up unable to leave message.

## 2012-12-05 NOTE — Telephone Encounter (Signed)
Pt cld me back, must have seen # on caller ID cause voice mail don't work.  Explained that [facility] neuro would call her mid to late next week to schedule.  She asked when to come back to see [place], looked in system - no appts scheduled-no recommendation in office note on return, told pt to call us if she needed anything.  She also questioned appts for the pulmon rehab, told her that her pulmon doc at [facility] would be setting that up for her, we didn't order it

## 2012-12-15 ENCOUNTER — Telehealth: Payer: Self-pay | Admitting: Neurology

## 2012-12-15 NOTE — Telephone Encounter (Signed)
I called patient. The blood work was unremarkable with exception that the thyroid was slightly low, with a TSH of around 7. I discussed this with the patient. She will be coming in for EMG and nerve conduction study evaluation.

## 2012-12-17 DIAGNOSIS — R202 Paresthesia of skin: Secondary | ICD-10-CM | POA: Insufficient documentation

## 2012-12-23 ENCOUNTER — Telehealth: Payer: Self-pay | Admitting: *Deleted

## 2012-12-23 NOTE — Telephone Encounter (Signed)
Pt was at [facility] outpt pharm picking up prescriptions and pharm called stated she told her she was having severe chest pain.pharm advised her to go to er pt declined wanted to talk with Dr. Lorin Picket first. I called pt. Pt stated she was having chest pain all over and shortness of breath advised to go directly to ER. Pt stated she would call her specialist to see what he recommeded.

## 2012-12-23 NOTE — Telephone Encounter (Signed)
Ok, this is appropriate

## 2012-12-24 ENCOUNTER — Encounter: Payer: Self-pay | Admitting: *Deleted

## 2013-01-01 ENCOUNTER — Encounter (HOSPITAL_COMMUNITY)
Admission: RE | Admit: 2013-01-01 | Discharge: 2013-01-01 | Disposition: A | Discharge status: Home / Self Care | Payer: 59 | Source: Ambulatory Visit | Attending: Family Medicine | Admitting: Family Medicine

## 2013-01-01 VITALS — BP 102/60 | HR 66 | Ht 70.0 in | Wt 201.6 lb

## 2013-01-01 DIAGNOSIS — D869 Sarcoidosis, unspecified: Secondary | ICD-10-CM | POA: Insufficient documentation

## 2013-01-01 DIAGNOSIS — Z5189 Encounter for other specified aftercare: Secondary | ICD-10-CM | POA: Insufficient documentation

## 2013-01-01 NOTE — Patient Instructions (Signed)
Pt has finished orientation and is scheduled to start PR on 01/05/13 at 8:15am. Pt has been instructed to arrive to class 15 minutes early for scheduled class. Pt has been instructed to wear comfortable clothing and shoes with rubber soles. Pt has been told to take their medications 1 hour prior to coming to class.  If the patient is not going to attend class, he/she has been instructed to call.

## 2013-01-01 NOTE — Progress Notes (Addendum)
Patient was referred to [facility] by Dr. Baldo Ash from [place], [place]. Due to Sarcoidosis 135. During orientation advised patient on arrival and appointment times what to wear, what to do before, during and after exercise. Reviewed attendance and class policy. Talked about inclement weather and class consultation policy. Pt is scheduled to start Pulm Rehab on 01/05/13 at 8:15 am. Pt was advised to come to class 5 minutes before class starts. He was also given instructions on meeting with the dietician and attending the Family Structure classes. Pt is eager to get started. Did not do the six minute walk test. Used results from walk test taken in [facility].

## 2013-01-05 ENCOUNTER — Encounter (HOSPITAL_COMMUNITY)
Admission: RE | Admit: 2013-01-05 | Discharge: 2013-01-05 | Disposition: A | Discharge status: Home / Self Care | Payer: 59 | Source: Ambulatory Visit | Attending: *Deleted | Admitting: *Deleted

## 2013-01-07 ENCOUNTER — Encounter (HOSPITAL_COMMUNITY)
Admission: RE | Admit: 2013-01-07 | Discharge: 2013-01-07 | Disposition: A | Discharge status: Home / Self Care | Payer: 59 | Source: Ambulatory Visit | Attending: *Deleted | Admitting: *Deleted

## 2013-01-08 ENCOUNTER — Encounter: Payer: Self-pay | Admitting: Family Medicine

## 2013-01-08 ENCOUNTER — Ambulatory Visit (INDEPENDENT_AMBULATORY_CARE_PROVIDER_SITE_OTHER): Payer: 59 | Admitting: Family Medicine

## 2013-01-08 VITALS — BP 114/74 | Temp 98.8°F | Wt 204.6 lb

## 2013-01-08 DIAGNOSIS — R197 Diarrhea, unspecified: Secondary | ICD-10-CM

## 2013-01-08 DIAGNOSIS — D869 Sarcoidosis, unspecified: Secondary | ICD-10-CM

## 2013-01-08 LAB — CBC WITH DIFFERENTIAL/PLATELET
Basophils Absolute: 0 10*3/uL (ref 0.0–0.1)
Basophils Relative: 0 % (ref 0–1)
Eosinophils Absolute: 0 10*3/uL (ref 0.0–0.7)
Eosinophils Relative: 0 % (ref 0–5)
HCT: 39.6 % (ref 36.0–46.0)
Hemoglobin: 13.3 g/dL (ref 12.0–15.0)
Lymphocytes Relative: 4 % — ABNORMAL LOW (ref 12–46)
Lymphs Abs: 0.6 10*3/uL — ABNORMAL LOW (ref 0.7–4.0)
MCH: 27.3 pg (ref 26.0–34.0)
MCHC: 33.6 g/dL (ref 30.0–36.0)
MCV: 81.3 fL (ref 78.0–100.0)
Monocytes Absolute: 0.5 10*3/uL (ref 0.1–1.0)
Monocytes Relative: 3 % (ref 3–12)
Neutro Abs: 14.2 10*3/uL — ABNORMAL HIGH (ref 1.7–7.7)
Neutrophils Relative %: 93 % — ABNORMAL HIGH (ref 43–77)
Platelets: 412 10*3/uL — ABNORMAL HIGH (ref 150–400)
RBC: 4.87 MIL/uL (ref 3.87–5.11)
RDW: 16.2 % — ABNORMAL HIGH (ref 11.5–15.5)
WBC: 15.4 10*3/uL — ABNORMAL HIGH (ref 4.0–10.5)

## 2013-01-08 MED ORDER — ONDANSETRON HCL 8 MG PO TABS: 8.0000 mg | ORAL_TABLET | Freq: Three times a day (TID) | ORAL | Status: DC | PRN

## 2013-01-08 NOTE — Progress Notes (Signed)
  Subjective:    Patient ID: Kathleen [name], female    DOB: May 29, 1961, 52 y.o.   MRN: [id number]  HPIThis patient with severe sarcoidosis is on 2 immunosuppressive medicines. She is on CellCept and also on prednisone. She denies fevers or chills but she does state intermittent diarrhea since starting CellCept she also states nausea over the past 24 hours with intermittent vomiting. Denies dysuria or sweats chills fevers. PMH sarcoidosis patient is debilitated from this. Hopefully we'll get some improvement. According to her husband 40% of her lung is damaged but they're hopeful the other part will be able to function better with treatment she is going through pulmonary therapy currently. She does not smoke Family history noncontributory    Review of Systems Denies chest pressure or tightness it's more shortness of breath at times also sharp pains in her chest when she takes a deep breath she gets soreness on her chest wall. She denies bloody stools.    Objective:   Physical Exam  Lungs have diminished air movement but otherwise normal heart regular pulse normal extremities no edema abdomen soft       Assessment & Plan:  Severe sarcoidosis-guarded long-term prognosis but some hope with current medications she sees specialists on a regular basis Recent lumbar puncture or to rule out involvement of sarcoid in the neurologic pathways. This may be contributing to her back pain but I do not feel it's causing her vomiting. Gastroenteritis possible viral possible side effects of medication I find no evidence of a severe infection I do recommend CBC, metabolic 7. Also Zofran 8 mg 3 times a day when necessary followup over the next 48 hours if not improving

## 2013-01-08 NOTE — Patient Instructions (Signed)
If fevers be seen right away Do your lab work

## 2013-01-09 ENCOUNTER — Encounter (HOSPITAL_COMMUNITY)
Admission: RE | Admit: 2013-01-09 | Discharge: 2013-01-09 | Disposition: A | Discharge status: Home / Self Care | Payer: 59 | Source: Ambulatory Visit | Attending: *Deleted | Admitting: *Deleted

## 2013-01-09 LAB — BASIC METABOLIC PANEL
BUN: 14 mg/dL (ref 6–23)
CO2: 29 mEq/L (ref 19–32)
Calcium: 8.6 mg/dL (ref 8.4–10.5)
Chloride: 98 mEq/L (ref 96–112)
Creat: 0.84 mg/dL (ref 0.50–1.10)
Glucose, Bld: 80 mg/dL (ref 70–99)
Potassium: 4.3 mEq/L (ref 3.5–5.3)
Sodium: 134 mEq/L — ABNORMAL LOW (ref 135–145)

## 2013-01-11 ENCOUNTER — Emergency Department (HOSPITAL_COMMUNITY)
Admission: EM | Admit: 2013-01-11 | Discharge: 2013-01-12 | Disposition: A | Discharge status: Home / Self Care | Payer: 59 | Attending: Emergency Medicine | Admitting: Emergency Medicine

## 2013-01-11 ENCOUNTER — Encounter (HOSPITAL_COMMUNITY): Payer: Self-pay | Admitting: Emergency Medicine

## 2013-01-11 ENCOUNTER — Emergency Department (HOSPITAL_COMMUNITY): Payer: 59

## 2013-01-11 DIAGNOSIS — IMO0002 Reserved for concepts with insufficient information to code with codable children: Secondary | ICD-10-CM | POA: Insufficient documentation

## 2013-01-11 DIAGNOSIS — R209 Unspecified disturbances of skin sensation: Secondary | ICD-10-CM | POA: Insufficient documentation

## 2013-01-11 DIAGNOSIS — Z79899 Other long term (current) drug therapy: Secondary | ICD-10-CM | POA: Insufficient documentation

## 2013-01-11 DIAGNOSIS — E669 Obesity, unspecified: Secondary | ICD-10-CM | POA: Insufficient documentation

## 2013-01-11 DIAGNOSIS — R109 Unspecified abdominal pain: Secondary | ICD-10-CM | POA: Insufficient documentation

## 2013-01-11 DIAGNOSIS — R1110 Vomiting, unspecified: Secondary | ICD-10-CM

## 2013-01-11 DIAGNOSIS — Z8739 Personal history of other diseases of the musculoskeletal system and connective tissue: Secondary | ICD-10-CM | POA: Insufficient documentation

## 2013-01-11 DIAGNOSIS — Z8742 Personal history of other diseases of the female genital tract: Secondary | ICD-10-CM | POA: Insufficient documentation

## 2013-01-11 DIAGNOSIS — K219 Gastro-esophageal reflux disease without esophagitis: Secondary | ICD-10-CM | POA: Insufficient documentation

## 2013-01-11 DIAGNOSIS — D869 Sarcoidosis, unspecified: Secondary | ICD-10-CM | POA: Insufficient documentation

## 2013-01-11 DIAGNOSIS — R112 Nausea with vomiting, unspecified: Secondary | ICD-10-CM | POA: Insufficient documentation

## 2013-01-11 DIAGNOSIS — Z9851 Tubal ligation status: Secondary | ICD-10-CM | POA: Insufficient documentation

## 2013-01-11 DIAGNOSIS — Z8679 Personal history of other diseases of the circulatory system: Secondary | ICD-10-CM | POA: Insufficient documentation

## 2013-01-11 DIAGNOSIS — Z8709 Personal history of other diseases of the respiratory system: Secondary | ICD-10-CM | POA: Insufficient documentation

## 2013-01-11 LAB — HEPATIC FUNCTION PANEL
ALT: 16 U/L (ref 0–35)
AST: 11 U/L (ref 0–37)
Albumin: 3.3 g/dL — ABNORMAL LOW (ref 3.5–5.2)
Alkaline Phosphatase: 52 U/L (ref 39–117)
Bilirubin, Direct: <0.1 mg/dL (ref 0.0–0.3)
Total Bilirubin: 0.4 mg/dL (ref 0.3–1.2)
Total Protein: 6.5 g/dL (ref 6.0–8.3)

## 2013-01-11 LAB — CBC WITH DIFFERENTIAL/PLATELET
Basophils Absolute: 0 10*3/uL (ref 0.0–0.1)
Basophils Relative: 0 % (ref 0–1)
Eosinophils Absolute: 0 10*3/uL (ref 0.0–0.7)
Eosinophils Relative: 0 % (ref 0–5)
HCT: 41.6 % (ref 36.0–46.0)
Hemoglobin: 14 g/dL (ref 12.0–15.0)
Lymphocytes Relative: 12 % (ref 12–46)
Lymphs Abs: 1.4 10*3/uL (ref 0.7–4.0)
MCH: 28.5 pg (ref 26.0–34.0)
MCHC: 33.7 g/dL (ref 30.0–36.0)
MCV: 84.6 fL (ref 78.0–100.0)
Monocytes Absolute: 0.5 10*3/uL (ref 0.1–1.0)
Monocytes Relative: 4 % (ref 3–12)
Neutro Abs: 9.8 10*3/uL — ABNORMAL HIGH (ref 1.7–7.7)
Neutrophils Relative %: 84 % — ABNORMAL HIGH (ref 43–77)
Platelets: 345 10*3/uL (ref 150–400)
RBC: 4.92 MIL/uL (ref 3.87–5.11)
RDW: 15.2 % (ref 11.5–15.5)
WBC: 11.7 10*3/uL — ABNORMAL HIGH (ref 4.0–10.5)

## 2013-01-11 LAB — URINALYSIS, ROUTINE W REFLEX MICROSCOPIC
Bilirubin Urine: NEGATIVE
Glucose, UA: NEGATIVE mg/dL
Hgb urine dipstick: NEGATIVE
Ketones, ur: NEGATIVE mg/dL
Leukocytes, UA: NEGATIVE
Nitrite: NEGATIVE
Protein, ur: NEGATIVE mg/dL
Specific Gravity, Urine: 1.01 (ref 1.005–1.030)
Urobilinogen, UA: 0.2 mg/dL (ref 0.0–1.0)
pH: 6 (ref 5.0–8.0)

## 2013-01-11 LAB — BASIC METABOLIC PANEL
BUN: 13 mg/dL (ref 6–23)
CO2: 24 mEq/L (ref 19–32)
Calcium: 8.8 mg/dL (ref 8.4–10.5)
Chloride: 99 mEq/L (ref 96–112)
Creatinine, Ser: 0.61 mg/dL (ref 0.50–1.10)
GFR calc Af Amer: >90 mL/min (ref >90)
GFR calc non Af Amer: >90 mL/min (ref >90)
Glucose, Bld: 107 mg/dL — ABNORMAL HIGH (ref 70–99)
Potassium: 4.1 mEq/L (ref 3.5–5.1)
Sodium: 133 mEq/L — ABNORMAL LOW (ref 135–145)

## 2013-01-11 LAB — LIPASE, BLOOD: Lipase: 49 U/L (ref 11–59)

## 2013-01-11 LAB — LACTIC ACID, PLASMA: Lactic Acid, Venous: 0.8 mmol/L (ref 0.5–2.2)

## 2013-01-11 MED ORDER — PROMETHAZINE HCL 25 MG/ML IJ SOLN
12.5000 mg | INTRAMUSCULAR | Status: DC | PRN
  Administered 2013-01-11: 12.5 mg via INTRAVENOUS
  Filled 2013-01-11: qty 1

## 2013-01-11 MED ORDER — IOHEXOL 300 MG/ML  SOLN
100.0000 mL | Freq: Once | INTRAMUSCULAR | Status: AC | PRN
  Administered 2013-01-11: 100 mL via INTRAVENOUS

## 2013-01-11 MED ORDER — SODIUM CHLORIDE 0.9 % IV SOLN
INTRAVENOUS | Status: DC
  Administered 2013-01-11: 22:00:00 via INTRAVENOUS

## 2013-01-11 MED ORDER — ONDANSETRON HCL 4 MG/2ML IJ SOLN
4.0000 mg | Freq: Once | INTRAMUSCULAR | Status: AC
  Administered 2013-01-11: 4 mg via INTRAVENOUS
  Filled 2013-01-11: qty 2

## 2013-01-11 MED ORDER — ONDANSETRON HCL 8 MG PO TABS: 8.0000 mg | ORAL_TABLET | Freq: Three times a day (TID) | ORAL | Status: DC | PRN

## 2013-01-11 MED ORDER — IOHEXOL 300 MG/ML  SOLN
50.0000 mL | Freq: Once | INTRAMUSCULAR | Status: AC | PRN
  Administered 2013-01-11: 50 mL via ORAL

## 2013-01-11 MED ORDER — SODIUM CHLORIDE 0.9 % IV BOLUS (SEPSIS)
1000.0000 mL | Freq: Once | INTRAVENOUS | Status: AC
  Administered 2013-01-11: 1000 mL via INTRAVENOUS

## 2013-01-11 NOTE — ED Provider Notes (Signed)
History     CSN: [id number]  Arrival date & time 01/11/13  1954   First MD Initiated Contact with Patient 01/11/13 2042      Chief Complaint  Patient presents with  . Nausea  . Emesis    (Consider location/radiation/quality/duration/timing/severity/associated sxs/prior treatment) HPI Comments: Kathleen [name] is a 52 y.o. Female who complains of persistent nausea, vomiting, and diarrhea for 5 days. She saw her PCP, 3 days ago, and was prescribed, Zofran, it has not helped. She saw her doctor at [facility]. One week ago and had a LP done to evaluate for neuro sarcoid. This has been done to 2 some paresthesias in the arms and legs. She is taking her other medications, as usual, without relief. She denies fever, chills, cough, shortness of breath, chest pain, abdominal pain, back pain. She feels dizzy when standing. There's been no blood in the emesis or diarrhea. She cannot tolerate anything by mouth without vomiting.  Patient is a 52 y.o. female presenting with vomiting. The history is provided by the patient.  Emesis   Past Medical History  Diagnosis Date  . Sarcoidosis   . Breast fibrocystic disorder   . Mitral valve prolapse   . Breast pain     right  . Normal stress echocardiogram Jan 2011  . Normal nuclear stress test December 2012  . Normal echocardiogram December 2012    EF 55 to 60%; mild MVP, grade 1 diastolic dysfunction, PAP 32  . Fibromyalgia   . DOE (dyspnea on exertion)   . Pulmonary fibrosis   . Gastroesophageal reflux disease   . Heart disease     Past Surgical History  Procedure Laterality Date  . Tubal ligation    . Breast lumpectomy  01/12/2011    right    Family History  Problem Relation Age of Onset  . Cardiomyopathy Mother   . Breast cancer Mother   . Heart disease Maternal Aunt   . Prostate cancer Father   . Colon cancer Neg Hx   . Neurofibromatosis Brother     History  Substance Use Topics  . Smoking status: Never Smoker   . Smokeless  tobacco: Never Used  . Alcohol Use: No    OB History   Grav Para Term Preterm Abortions TAB SAB Ect Mult Living                  Review of Systems  Gastrointestinal: Positive for vomiting.  All other systems reviewed and are negative.    Allergies  Bee venom; Benadryl; and Codeine  Home Medications   Current Outpatient Rx  Name  Route  Sig  Dispense  Refill  . ALPRAZolam (XANAX) 0.5 MG tablet   Oral   Take 0.5 mg by mouth 3 (three) times daily as needed for sleep or anxiety. Take 1/2 to 1 tablet by mouth three times daily as needed for anxiety         . diclofenac (VOLTAREN) 75 MG EC tablet   Oral   Take 1 tablet (75 mg total) by mouth 2 (two) times daily.   60 tablet   1   . esomeprazole (NEXIUM) 40 MG capsule   Oral   Take 40 mg by mouth daily before breakfast.         . famotidine (PEPCID) 20 MG tablet   Oral   Take 20 mg by mouth daily as needed for heartburn.          . mycophenolate (CELLCEPT) 500  MG tablet   Oral   Take 500 mg by mouth 2 (two) times daily.         . ondansetron (ZOFRAN) 8 MG tablet   Oral   Take 1 tablet (8 mg total) by mouth every 8 (eight) hours as needed for nausea.   20 tablet   2   . polycarbophil (FIBERCON) 625 MG tablet   Oral   Take 625 mg by mouth daily.         . predniSONE (DELTASONE) 10 MG tablet   Oral   Take 10 mg by mouth 2 (two) times daily.         . pregabalin (LYRICA) 200 MG capsule   Oral   Take 200 mg by mouth daily.           BP 114/59  Pulse 85  Temp(Src) 97.9 F (36.6 C) (Oral)  Resp 18  Ht 5\' 10"  (1.778 m)  Wt 210 lb (95.255 kg)  BMI 30.13 kg/m2  SpO2 96%  LMP 01/04/2013  Physical Exam  Nursing note and vitals reviewed. Constitutional: She is oriented to person, place, and time. She appears well-developed.  Obese  HENT:  Head: Normocephalic and atraumatic.  Eyes: Conjunctivae and EOM are normal. Pupils are equal, round, and reactive to light.  Neck: Normal range of motion  and phonation normal. Neck supple.  Cardiovascular: Normal rate, regular rhythm and intact distal pulses.   Pulmonary/Chest: Effort normal and breath sounds normal. No respiratory distress. She exhibits no tenderness.  Abdominal: Soft. She exhibits no distension. There is no tenderness. There is no guarding.  Hyperactive bowel sounds  Musculoskeletal: Normal range of motion.  Neurological: She is alert and oriented to person, place, and time. She has normal strength. She exhibits normal muscle tone.  Skin: Skin is warm and dry.  Psychiatric: She has a normal mood and affect. Her behavior is normal. Judgment and thought content normal.    ED Course  Procedures (including critical care time)  Medications  0.9 %  sodium chloride infusion (not administered)  promethazine (PHENERGAN) injection 12.5 mg (25 mg Intravenous Given 01/11/13 2115)  iohexol (OMNIPAQUE) 300 MG/ML solution 50 mL (not administered)  sodium chloride 0.9 % bolus 1,000 mL (1,000 mLs Intravenous New Bag/Given 01/11/13 2007)    Patient Vitals for the past 24 hrs:  BP Temp Temp src Pulse Resp SpO2 Height Weight  01/11/13 1957 114/59 mmHg 97.9 F (36.6 C) Oral 85 18 96 % 5\' 10"  (1.778 m) 210 lb (95.255 kg)   21:16 CT the abdomen, pelvis, ordered to evaluate for intra-abdominal sarcoid as cause for her nausea and vomiting. She may need to be admitted, if her symptoms, cannot be controlled.  Labs Reviewed  CBC WITH DIFFERENTIAL - Abnormal; Notable for the following:    WBC 11.7 (*)    Neutrophils Relative % 84 (*)    Neutro Abs 9.8 (*)    All other components within normal limits  BASIC METABOLIC PANEL - Abnormal; Notable for the following:    Sodium 133 (*)    Glucose, Bld 107 (*)    All other components within normal limits  URINE CULTURE  LIPASE, BLOOD  LACTIC ACID, PLASMA  URINALYSIS, ROUTINE W REFLEX MICROSCOPIC  HEPATIC FUNCTION PANEL      No diagnosis found.    MDM  Nonspecific, vomiting, and diarrhea  in patient with sarcoid disease. She is relatively immunocompromised, secondary to treatment for sarcoid.   22:00-Care to oncoming provider team to evaluate  after CT imaging and administration of fluids and antiemetic        Flint Melter, MD 01/11/13 2147

## 2013-01-11 NOTE — ED Notes (Signed)
Patient has drank 1/2 bottle of oral contrast.  Patient informed she needs to finish oral contrast and that she needs to give a urine specimen.

## 2013-01-11 NOTE — ED Notes (Signed)
Patient ambulated to bathroom with steady gait to collect urine specimen.

## 2013-01-11 NOTE — ED Provider Notes (Signed)
Care assumed from Dr. Effie Shy at shift change awaiting ct scan.  The results returned showing a normal study with no acute process.  I went to the patient's room to inform her of the results, however she is not feeling much better.  I have ordered another dose of zofran and will discharge her to home with odt zofran, fluids as tolerated.  Geoffery Lyons, MD 01/11/13 [phone]

## 2013-01-11 NOTE — ED Notes (Signed)
Patient presents to ER via [facility] with c/o nausea and vomiting x 4 days.

## 2013-01-12 ENCOUNTER — Encounter (HOSPITAL_COMMUNITY): Payer: 59

## 2013-01-12 NOTE — ED Notes (Signed)
Discharge instructions given and reviewed with patient and husband.  Prescription given for Zofran; effects and use explained.  Patient verbalized understanding to take Zofran as directed and to follow up with her PMD as needed.  Patient discharged home in good condition via wheelchair.

## 2013-01-13 ENCOUNTER — Ambulatory Visit: Payer: 59 | Admitting: Family Medicine

## 2013-01-13 LAB — URINE CULTURE: Colony Count: >=100000

## 2013-01-14 ENCOUNTER — Encounter (HOSPITAL_COMMUNITY): Payer: 59

## 2013-01-14 ENCOUNTER — Telehealth (HOSPITAL_COMMUNITY): Payer: Self-pay | Admitting: Emergency Medicine

## 2013-01-14 NOTE — Progress Notes (Signed)
ED Antimicrobial Stewardship Positive Culture Follow Up   Kathleen [name] is an 52 y.o. female who presented to [facility] on 01/11/2013 with a chief complaint of N/V x 5 days  Chief Complaint  Patient presents with  . Nausea  . Emesis    Recent Results (from the past 720 hour(s))  URINE CULTURE     Status: None   Collection Time    01/11/13 10:30 PM      Result Value Range Status   Specimen Description URINE, CLEAN CATCH   Final   Special Requests NONE   Final   Culture  Setup Time 01/11/2013 23:00   Final   Colony Count >=100,000 COLONIES/ML   Final   Culture PSEUDOMONAS AERUGINOSA   Final   Report Status 01/13/2013 FINAL   Final   Organism ID, Bacteria PSEUDOMONAS AERUGINOSA   Final    []  Treated with  organism resistant to prescribed antimicrobial [x]  Patient discharged originally without antimicrobial agent and treatment is now indicated  The patient is immunocompromised as she is on Cellcept for sarcoidosis -- therefore should receive treatment for PSA UTI.  New antibiotic prescription: Ciprofloxacin 250 mg bid x 7 days  ED Provider: Elpidio Anis, PA-C   Rolley Sims 01/14/2013, 12:00 PM Infectious Diseases Pharmacist Phone# [phone]

## 2013-01-14 NOTE — ED Notes (Signed)
Post ED Visit - Positive Culture Follow-up: Successful Patient Follow-Up  Culture assessed and recommendations reviewed by: []  Wes Dulaney, Pharm.D., BCPS []  Celedonio Miyamoto, Pharm.D., BCPS [x]  Georgina Pillion, Pharm.D., BCPS []  [place], [address].D., BCPS, AAHIVP []  Estella Husk, Pharm.D., BCPS, AAHIVP  Positive urine culture  [x]  Patient discharged without antimicrobial prescription and treatment is now indicated []  Organism is resistant to prescribed ED discharge antimicrobial []  Patient with positive blood cultures  Changes discussed with ED provider: Elpidio Anis New antibiotic prescription Cipro 250 mg BID x 7 days written by Gwyndolyn Saxon 01/14/2013, 3:51 PM

## 2013-01-16 ENCOUNTER — Encounter: Payer: Self-pay | Admitting: Family Medicine

## 2013-01-16 ENCOUNTER — Encounter (HOSPITAL_COMMUNITY): Payer: 59

## 2013-01-16 ENCOUNTER — Ambulatory Visit (INDEPENDENT_AMBULATORY_CARE_PROVIDER_SITE_OTHER): Payer: 59 | Admitting: Family Medicine

## 2013-01-16 ENCOUNTER — Encounter (HOSPITAL_COMMUNITY): Payer: Self-pay | Admitting: *Deleted

## 2013-01-16 ENCOUNTER — Inpatient Hospital Stay (HOSPITAL_COMMUNITY)
Admission: EM | Admit: 2013-01-16 | Discharge: 2013-01-21 | DRG: 392 | Disposition: A | Discharge status: Home / Self Care | Payer: 59 | Attending: Internal Medicine | Admitting: Internal Medicine

## 2013-01-16 VITALS — BP 82/52 | Temp 98.1°F | Ht 70.0 in | Wt 195.4 lb

## 2013-01-16 DIAGNOSIS — Z8249 Family history of ischemic heart disease and other diseases of the circulatory system: Secondary | ICD-10-CM

## 2013-01-16 DIAGNOSIS — Z91038 Other insect allergy status: Secondary | ICD-10-CM

## 2013-01-16 DIAGNOSIS — R209 Unspecified disturbances of skin sensation: Secondary | ICD-10-CM

## 2013-01-16 DIAGNOSIS — R05 Cough: Secondary | ICD-10-CM

## 2013-01-16 DIAGNOSIS — IMO0001 Reserved for inherently not codable concepts without codable children: Secondary | ICD-10-CM | POA: Diagnosis present

## 2013-01-16 DIAGNOSIS — R0609 Other forms of dyspnea: Secondary | ICD-10-CM

## 2013-01-16 DIAGNOSIS — I059 Rheumatic mitral valve disease, unspecified: Secondary | ICD-10-CM | POA: Diagnosis present

## 2013-01-16 DIAGNOSIS — Z79899 Other long term (current) drug therapy: Secondary | ICD-10-CM

## 2013-01-16 DIAGNOSIS — E876 Hypokalemia: Secondary | ICD-10-CM | POA: Diagnosis present

## 2013-01-16 DIAGNOSIS — J99 Respiratory disorders in diseases classified elsewhere: Secondary | ICD-10-CM | POA: Diagnosis present

## 2013-01-16 DIAGNOSIS — R1110 Vomiting, unspecified: Secondary | ICD-10-CM

## 2013-01-16 DIAGNOSIS — I959 Hypotension, unspecified: Secondary | ICD-10-CM | POA: Diagnosis present

## 2013-01-16 DIAGNOSIS — R079 Chest pain, unspecified: Secondary | ICD-10-CM

## 2013-01-16 DIAGNOSIS — IMO0002 Reserved for concepts with insufficient information to code with codable children: Secondary | ICD-10-CM

## 2013-01-16 DIAGNOSIS — E860 Dehydration: Secondary | ICD-10-CM | POA: Diagnosis present

## 2013-01-16 DIAGNOSIS — K2970 Gastritis, unspecified, without bleeding: Secondary | ICD-10-CM

## 2013-01-16 DIAGNOSIS — K219 Gastro-esophageal reflux disease without esophagitis: Secondary | ICD-10-CM | POA: Diagnosis present

## 2013-01-16 DIAGNOSIS — F411 Generalized anxiety disorder: Secondary | ICD-10-CM | POA: Diagnosis present

## 2013-01-16 DIAGNOSIS — R197 Diarrhea, unspecified: Principal | ICD-10-CM | POA: Diagnosis present

## 2013-01-16 DIAGNOSIS — Z803 Family history of malignant neoplasm of breast: Secondary | ICD-10-CM

## 2013-01-16 DIAGNOSIS — D869 Sarcoidosis, unspecified: Secondary | ICD-10-CM | POA: Diagnosis present

## 2013-01-16 DIAGNOSIS — M79609 Pain in unspecified limb: Secondary | ICD-10-CM

## 2013-01-16 DIAGNOSIS — R059 Cough, unspecified: Secondary | ICD-10-CM

## 2013-01-16 DIAGNOSIS — B9681 Helicobacter pylori [H. pylori] as the cause of diseases classified elsewhere: Secondary | ICD-10-CM

## 2013-01-16 DIAGNOSIS — K5900 Constipation, unspecified: Secondary | ICD-10-CM | POA: Diagnosis present

## 2013-01-16 DIAGNOSIS — I951 Orthostatic hypotension: Secondary | ICD-10-CM

## 2013-01-16 DIAGNOSIS — N390 Urinary tract infection, site not specified: Secondary | ICD-10-CM

## 2013-01-16 DIAGNOSIS — B965 Pseudomonas (aeruginosa) (mallei) (pseudomallei) as the cause of diseases classified elsewhere: Secondary | ICD-10-CM | POA: Diagnosis present

## 2013-01-16 DIAGNOSIS — R0600 Dyspnea, unspecified: Secondary | ICD-10-CM

## 2013-01-16 DIAGNOSIS — R112 Nausea with vomiting, unspecified: Secondary | ICD-10-CM | POA: Diagnosis present

## 2013-01-16 LAB — URINALYSIS, ROUTINE W REFLEX MICROSCOPIC
Bilirubin Urine: NEGATIVE
Glucose, UA: NEGATIVE mg/dL
Ketones, ur: NEGATIVE mg/dL
Leukocytes, UA: NEGATIVE
Nitrite: NEGATIVE
Protein, ur: NEGATIVE mg/dL
Specific Gravity, Urine: <1.005 — ABNORMAL LOW (ref 1.005–1.030)
Urobilinogen, UA: 0.2 mg/dL (ref 0.0–1.0)
pH: 6 (ref 5.0–8.0)

## 2013-01-16 LAB — COMPREHENSIVE METABOLIC PANEL
ALT: 18 U/L (ref 0–35)
AST: 12 U/L (ref 0–37)
Albumin: 3.6 g/dL (ref 3.5–5.2)
Alkaline Phosphatase: 50 U/L (ref 39–117)
BUN: 12 mg/dL (ref 6–23)
CO2: 22 mEq/L (ref 19–32)
Calcium: 8.5 mg/dL (ref 8.4–10.5)
Chloride: 103 mEq/L (ref 96–112)
Creatinine, Ser: 0.68 mg/dL (ref 0.50–1.10)
GFR calc Af Amer: >90 mL/min (ref >90)
GFR calc non Af Amer: >90 mL/min (ref >90)
Glucose, Bld: 117 mg/dL — ABNORMAL HIGH (ref 70–99)
Potassium: 3.9 mEq/L (ref 3.5–5.1)
Sodium: 136 mEq/L (ref 135–145)
Total Bilirubin: 0.3 mg/dL (ref 0.3–1.2)
Total Protein: 6.3 g/dL (ref 6.0–8.3)

## 2013-01-16 LAB — POCT URINALYSIS DIPSTICK
Blood, UA: 10
Spec Grav, UA: 1.01
pH, UA: 5

## 2013-01-16 LAB — CBC
HCT: 36.8 % (ref 36.0–46.0)
Hemoglobin: 12.6 g/dL (ref 12.0–15.0)
MCH: 28.8 pg (ref 26.0–34.0)
MCHC: 34.2 g/dL (ref 30.0–36.0)
MCV: 84 fL (ref 78.0–100.0)
Platelets: 275 10*3/uL (ref 150–400)
RBC: 4.38 MIL/uL (ref 3.87–5.11)
RDW: 15.3 % (ref 11.5–15.5)
WBC: 7.7 10*3/uL (ref 4.0–10.5)

## 2013-01-16 LAB — URINE MICROSCOPIC-ADD ON

## 2013-01-16 MED ORDER — SODIUM CHLORIDE 0.9 % IV SOLN
INTRAVENOUS | Status: AC
  Administered 2013-01-16: 21:00:00 via INTRAVENOUS

## 2013-01-16 MED ORDER — PREGABALIN 75 MG PO CAPS
200.0000 mg | ORAL_CAPSULE | Freq: Every day | ORAL | Status: DC
  Administered 2013-01-17 – 2013-01-21 (×5): 200 mg via ORAL
  Filled 2013-01-16 (×2): qty 1
  Filled 2013-01-16: qty 4
  Filled 2013-01-16 (×2): qty 1

## 2013-01-16 MED ORDER — SODIUM CHLORIDE 0.9 % IV SOLN
INTRAVENOUS | Status: AC
  Administered 2013-01-16 – 2013-01-17 (×2): via INTRAVENOUS

## 2013-01-16 MED ORDER — ONDANSETRON HCL 4 MG PO TABS: 4.0000 mg | ORAL_TABLET | Freq: Four times a day (QID) | ORAL | Status: DC | PRN

## 2013-01-16 MED ORDER — SODIUM CHLORIDE 0.9 % IV BOLUS (SEPSIS)
1000.0000 mL | Freq: Once | INTRAVENOUS | Status: AC
  Administered 2013-01-16: 1000 mL via INTRAVENOUS

## 2013-01-16 MED ORDER — HYDROCORTISONE SOD SUCCINATE 100 MG IJ SOLR
100.0000 mg | Freq: Once | INTRAMUSCULAR | Status: AC
  Administered 2013-01-16: 100 mg via INTRAVENOUS
  Filled 2013-01-16: qty 2

## 2013-01-16 MED ORDER — ACETAMINOPHEN 650 MG RE SUPP: 650.0000 mg | Freq: Four times a day (QID) | RECTAL | Status: DC | PRN

## 2013-01-16 MED ORDER — ACETAMINOPHEN 325 MG PO TABS: 650.0000 mg | ORAL_TABLET | Freq: Four times a day (QID) | ORAL | Status: DC | PRN

## 2013-01-16 MED ORDER — DEXTROSE 5 % IV SOLN
1.0000 g | Freq: Two times a day (BID) | INTRAVENOUS | Status: DC
  Administered 2013-01-17 – 2013-01-21 (×10): 1 g via INTRAVENOUS
  Filled 2013-01-16 (×12): qty 1

## 2013-01-16 MED ORDER — ONDANSETRON HCL 4 MG/2ML IJ SOLN
4.0000 mg | Freq: Four times a day (QID) | INTRAMUSCULAR | Status: DC | PRN
  Administered 2013-01-17 – 2013-01-18 (×3): 4 mg via INTRAVENOUS
  Filled 2013-01-16 (×3): qty 2

## 2013-01-16 MED ORDER — DEXTROSE 5 % IV SOLN
INTRAVENOUS | Status: AC
  Filled 2013-01-16: qty 1

## 2013-01-16 MED ORDER — PANTOPRAZOLE SODIUM 40 MG PO TBEC
80.0000 mg | DELAYED_RELEASE_TABLET | Freq: Every day | ORAL | Status: DC
  Administered 2013-01-17 – 2013-01-21 (×5): 80 mg via ORAL
  Filled 2013-01-16 (×5): qty 2

## 2013-01-16 MED ORDER — ENOXAPARIN SODIUM 40 MG/0.4ML ~~LOC~~ SOLN
40.0000 mg | SUBCUTANEOUS | Status: DC
  Administered 2013-01-16 – 2013-01-20 (×5): 40 mg via SUBCUTANEOUS
  Filled 2013-01-16 (×5): qty 0.4

## 2013-01-16 MED ORDER — PIPERACILLIN-TAZOBACTAM 3.375 G IVPB
3.3750 g | Freq: Once | INTRAVENOUS | Status: AC
  Administered 2013-01-16: 3.375 g via INTRAVENOUS
  Filled 2013-01-16: qty 50

## 2013-01-16 MED ORDER — SODIUM CHLORIDE 0.9 % IV BOLUS (SEPSIS): 500.0000 mL | Freq: Once | INTRAVENOUS | Status: DC

## 2013-01-16 MED ORDER — ONDANSETRON HCL 4 MG/2ML IJ SOLN: 4.0000 mg | Freq: Three times a day (TID) | INTRAMUSCULAR | Status: DC | PRN

## 2013-01-16 MED ORDER — ALPRAZOLAM 0.5 MG PO TABS
0.2500 mg | ORAL_TABLET | Freq: Three times a day (TID) | ORAL | Status: DC | PRN
  Administered 2013-01-16 – 2013-01-20 (×7): 0.5 mg via ORAL
  Filled 2013-01-16 (×7): qty 1

## 2013-01-16 MED ORDER — CIPROFLOXACIN HCL 500 MG PO TABS: 500.0000 mg | ORAL_TABLET | Freq: Two times a day (BID) | ORAL | Status: DC

## 2013-01-16 MED ORDER — HYDROCORTISONE SOD SUCCINATE 100 MG IJ SOLR
50.0000 mg | Freq: Four times a day (QID) | INTRAMUSCULAR | Status: DC
  Administered 2013-01-17 – 2013-01-18 (×6): 50 mg via INTRAVENOUS
  Filled 2013-01-16 (×6): qty 2

## 2013-01-16 NOTE — Patient Instructions (Addendum)
If fevers then go back to the ER  Do your best to eat and drink  Use Immodium up to 4 times a day  Get meds at [facility] : Cipro 500mg , one 2 times a day for 10 days,   If bloody stools or if mucous in stools then go to ER  Follow up here in 5 to 7 days

## 2013-01-16 NOTE — Progress Notes (Addendum)
  Subjective:    Patient ID: Kathleen [name], female    DOB: June 10, 1961, 52 y.o.   MRN: [id number]  Emesis  This is a new problem. The current episode started 1 to 4 weeks ago. The problem occurs 5 to 10 times per day. The problem has been unchanged. The emesis has an appearance of stomach contents. There has been no fever. Associated symptoms include diarrhea. She has tried increased fluids (zofran) for the symptoms. The treatment provided no relief.      Review of Systems  Gastrointestinal: Positive for vomiting and diarrhea.       Objective:   Physical Exam Lungs are clear heart is regular patient appears to feel very ill she looks extremely uncomfortable more than likely due to extreme nausea. Patient was seen walking in the hallway very slowly touching the wall as she went. She states that she drove herself here to the office. Her abdomen was soft with subjective soreness skin turgor good mucous membranes moist eardrums normal neurologic grossly normal vital signs were noted. I did do orthostatics and she was orthostatic.       Assessment & Plan:  Severe gastroenteritis-I. Cannot rule out the possibility of underlying infection related to the prednisone she is using. I am very concerned about her. I feel that she because of her orthostasis is at risk if she should go home. I recommend that she go to the ER. She was not capable of going there by herself she did not have any family with her. She was driven safely over to the ER by myself without incident.

## 2013-01-16 NOTE — ED Notes (Signed)
Patient has had low blood pressure and weakness for the past

## 2013-01-16 NOTE — ED Notes (Signed)
Patient has had several episodes of diarrhea and low blood pressure recnetly, family is frustrated and want to know what is wrong, patient is accompanied to ed by Dr Gerda Diss

## 2013-01-16 NOTE — H&P (Addendum)
[facility] History and Physical  Kathleen [name] WUJ:[id number] DOB: [birth year] DOA: 01/16/2013   PCP: [name], MD  Specialists: She is followed by Dr. Dudley Major in [facility] for sarcoidosis  Chief Complaint: Weakness, nausea and vomiting, diarrhea for the past 2-3 weeks  HPI: Kathleen [name] is a 52 y.o. female with a past medical history of fibromyalgia, acid reflux disease, and sarcoidosis that was recently evaluated in [facility] in February of this year. She was started on Bactrim for 2 weeks, prednisone, as well as mycophenolate. She was also given methotrexate but she could not tolerate it due to hair loss. She was taken off of the mycophenolate about one week ago. She tells me that her nausea and vomiting, started about 2 weeks ago, followed by onset of diarrhea. She's been vomiting everything that she eats and bilious material. Denies any blood in the emesis. Has been having diarrhea about 5-6 times a day. Without any blood. It has been watery. No mucus. No abdominal pain or cramping. Denies any fever. Has had chills. No urinary complaints. She came in to the ED a few days ago, underwent blood workup, which was unremarkable. She was given IV fluids. Urine obtained from that visit grew Pseudomonas, more than 100,000 colonies. Ciprofloxacin was called a few days ago, however the patient has not taken yet. She's also has the weakness and has lost about 15 pounds in the last 2 weeks. She's very anxious. She is accompanied by her husband and her daughter. She does admit to dizziness, but no syncopal episodes. Denies any shortness of breath, or chest pains.  Home Medications: Prior to Admission medications   Medication Sig Start Date End Date Taking? Authorizing Provider  ALPRAZolam Prudy Feeler) 0.5 MG tablet Take 0.5 mg by mouth 3 (three) times daily as needed for sleep or anxiety. Take 1/2 to 1 tablet by mouth three times daily as needed for anxiety 03/07/12  Yes Nyoka Cowden, MD  esomeprazole  (NEXIUM) 40 MG capsule Take 40 mg by mouth daily before breakfast.   Yes Historical Provider, MD  loperamide (IMODIUM) 2 MG capsule Take 2 mg by mouth 4 (four) times daily as needed for diarrhea or loose stools.   Yes Historical Provider, MD  ondansetron (ZOFRAN) 8 MG tablet Take 1 tablet (8 mg total) by mouth every 8 (eight) hours as needed for nausea. 01/11/13  Yes Geoffery Lyons, MD  predniSONE (DELTASONE) 10 MG tablet Take 10 mg by mouth 6 (six) times daily.   Yes Historical Provider, MD  pregabalin (LYRICA) 200 MG capsule Take 200 mg by mouth daily.   Yes Historical Provider, MD  ciprofloxacin (CIPRO) 500 MG tablet Take 500 mg by mouth 2 (two) times daily. 01/16/13 01/23/13  [name], MD  famotidine (PEPCID) 20 MG tablet Take 20 mg by mouth daily as needed for heartburn.     Historical Provider, MD    Allergies:  Allergies  Allergen Reactions  . Bee Venom   . Benadryl (Diphenhydramine Hcl)     "makes my throat close up"  . Codeine Itching    Past Medical History: Past Medical History  Diagnosis Date  . Sarcoidosis   . Breast fibrocystic disorder   . Mitral valve prolapse   . Breast pain     right  . Normal stress echocardiogram Jan 2011  . Normal nuclear stress test December 2012  . Normal echocardiogram December 2012    EF 55 to 60%; mild MVP, grade 1 diastolic dysfunction,  PAP 32  . Fibromyalgia   . DOE (dyspnea on exertion)   . Pulmonary fibrosis   . Gastroesophageal reflux disease   . Heart disease     Past Surgical History  Procedure Laterality Date  . Tubal ligation    . Breast lumpectomy  01/12/2011    right    Social History:  reports that she has never smoked. She has never used smokeless tobacco. She reports that she does not drink alcohol or use illicit drugs.  Living Situation: She lives with her husband Activity Level: Usually independent with daily activities   Family History:  Family History  Problem Relation Age of Onset  . Cardiomyopathy  Mother   . Breast cancer Mother   . Heart disease Maternal Aunt   . Prostate cancer Father   . Colon cancer Neg Hx   . Neurofibromatosis Brother      Review of Systems - History obtained from the patient General ROS: positive for  - fatigue Psychological ROS: positive for - anxiety Ophthalmic ROS: negative ENT ROS: negative Allergy and Immunology ROS: negative Hematological and Lymphatic ROS: negative Endocrine ROS: negative Respiratory ROS: no cough, shortness of breath, or wheezing Cardiovascular ROS: no chest pain or dyspnea on exertion Gastrointestinal ROS: as in hpi Genito-Urinary ROS: no dysuria, trouble voiding, or hematuria Musculoskeletal ROS: negative Neurological ROS: dizziness Dermatological ROS: negative  Physical Examination  Filed Vitals:   01/16/13 1749 01/16/13 2000  BP: 92/47 112/62  Pulse: 82 64  Temp: 98.8 F (37.1 C) 98 F (36.7 C)  TempSrc: Oral Oral  Resp: 20 20  SpO2: 96% 97%    General appearance: alert, cooperative, appears stated age and no distress Head: Normocephalic, without obvious abnormality, atraumatic Eyes: conjunctivae/corneas clear. PERRL, EOM's intact.  Throat: lips, mucosa, and tongue normal; teeth and gums normal Neck: no adenopathy, no carotid bruit, no JVD, supple, symmetrical, trachea midline and thyroid not enlarged, symmetric, no tenderness/mass/nodules Resp: clear to auscultation bilaterally Cardio: regular rate and rhythm, S1, S2 normal, no murmur, click, rub or gallop GI: soft, non-tender; bowel sounds normal; no masses,  no organomegaly Extremities: extremities normal, atraumatic, no cyanosis or edema Pulses: 2+ and symmetric Skin: Skin color, texture, turgor normal. No rashes or lesions Lymph nodes: Cervical, supraclavicular, and axillary nodes normal. Neurologic: She is alert and oriented x3. Cranial nerves are intact. No focal neurological deficits are noted.  Laboratory Data: Results for orders placed during the  hospital encounter of 01/16/13 (from the past 48 hour(s))  CBC     Status: None   Collection Time    01/16/13  6:33 PM      Result Value Range   WBC 7.7  4.0 - 10.5 K/uL   RBC 4.38  3.87 - 5.11 MIL/uL   Hemoglobin 12.6  12.0 - 15.0 g/dL   HCT 16.1  09.6 - 04.5 %   MCV 84.0  78.0 - 100.0 fL   MCH 28.8  26.0 - 34.0 pg   MCHC 34.2  30.0 - 36.0 g/dL   RDW 40.9  81.1 - 91.4 %   Platelets 275  150 - 400 K/uL  COMPREHENSIVE METABOLIC PANEL     Status: Abnormal   Collection Time    01/16/13  6:33 PM      Result Value Range   Sodium 136  135 - 145 mEq/L   Potassium 3.9  3.5 - 5.1 mEq/L   Chloride 103  96 - 112 mEq/L   CO2 22  19 - 32  mEq/L   Glucose, Bld 117 (*) 70 - 99 mg/dL   BUN 12  6 - 23 mg/dL   Creatinine, Ser 1.61  0.50 - 1.10 mg/dL   Calcium 8.5  8.4 - 09.6 mg/dL   Total Protein 6.3  6.0 - 8.3 g/dL   Albumin 3.6  3.5 - 5.2 g/dL   AST 12  0 - 37 U/L   ALT 18  0 - 35 U/L   Alkaline Phosphatase 50  39 - 117 U/L   Total Bilirubin 0.3  0.3 - 1.2 mg/dL   GFR calc non Af Amer >90  >90 mL/min   GFR calc Af Amer >90  >90 mL/min   Comment:            The eGFR has been calculated     using the CKD EPI equation.     This calculation has not been     validated in all clinical     situations.     eGFR's persistently     <90 mL/min signify     possible Chronic Kidney Disease.  URINALYSIS, ROUTINE W REFLEX MICROSCOPIC     Status: Abnormal   Collection Time    01/16/13  8:09 PM      Result Value Range   Color, Urine YELLOW  YELLOW   APPearance CLEAR  CLEAR   Specific Gravity, Urine <1.005 (*) 1.005 - 1.030   pH 6.0  5.0 - 8.0   Glucose, UA NEGATIVE  NEGATIVE mg/dL   Hgb urine dipstick TRACE (*) NEGATIVE   Bilirubin Urine NEGATIVE  NEGATIVE   Ketones, ur NEGATIVE  NEGATIVE mg/dL   Protein, ur NEGATIVE  NEGATIVE mg/dL   Urobilinogen, UA 0.2  0.0 - 1.0 mg/dL   Nitrite NEGATIVE  NEGATIVE   Leukocytes, UA NEGATIVE  NEGATIVE  URINE MICROSCOPIC-ADD ON     Status: None   Collection  Time    01/16/13  8:09 PM      Result Value Range   WBC, UA 0-2  <3 WBC/hpf   RBC / HPF 0-2  <3 RBC/hpf    Radiology Reports: No results found.  Problem List  Principal Problem:   Acute diarrhea Active Problems:   Sarcoidosis   ANXIETY   Orthostatic hypotension   Dehydration   Nausea and vomiting   Assessment: This is a 52 year old, Caucasian female, with sarcoidosis, and recently started on immunomodulating agents, including prednisone. She's been having nausea, vomiting, and diarrhea for the last 2-3 weeks. Etiology is unclear. She had a CT scan of abdomen few days ago, which was unremarkable. She was also orthostatic here probably due to dehydration. Adrenal insufficiency, though unlikely, cannot be ruled out.  Plan: #1 acute diarrhea with nausea and vomiting: Etiology remains unclear. But probably related to side effects of medications. C. Difficile cannot be ruled out. She'll be on contact precautions. Stool studies will be ordered. Gastroenterology will be consulted. She might require sigmoidoscopy or colonoscopy if c diff is negative. TSH will be checked  #2 dehydration with orthostatic hypotension. Blood pressure is improved with IV fluids. She'll be admitted to the hospital and continued on fluids.  #3 UTI with pseudomonas: Cefepime will be ordered for the same.  #4 recently diagnosed sarcoidosis: Continue with steroids. However, we will give it intravenously in the form of hydrocortisone for presumed adrenal insufficiency.  #5 history of sarcoidosis: Continue to monitor for now. Continue with steroids as mentioned above.  Slightly elevated glucose is probably due to prednisone. We'll  get a fasting level in the morning.  DVT Prophylaxis: Lovenox Code Status: She is a full code Family Communication: Discussed with the patient, her husband and her daughter, with the patient's permission  Disposition Plan: Admit to MedSurg.   Further management decisions will depend on  results of further testing and patient's response to treatment.  [facility]  [facility] Pager [phone]  If 7PM-7AM, please contact night-coverage [email or web address] Password [facility]  01/16/2013, 9:24 PM

## 2013-01-16 NOTE — ED Provider Notes (Signed)
History     CSN: [id number]  Arrival date & time 01/16/13  1732   First MD Initiated Contact with Patient 01/16/13 1804      Chief complaint: generalized weakness  (Consider location/radiation/quality/duration/timing/severity/associated sxs/prior treatment) The history is provided by the patient.  pt from pcp office, indicates nvd for the past week. Several episodes of each. Vomiting clear or color recently ingested food/liquids, not bloody or bilious. Nausea today but no vomiting today. Diarrhea watery, 4-5 x today, not bloody. No known bad food ingestion or ill contacts. Was on abx for 'sarcoid' 2-3 weeks ago. Denies cough or fever. No sob. Denies abd pain. No headaches. Denies change in meds or new meds. States compliant w meds but unsure if keeping down, including prednisone which pt is on chronically. No dysuria or gu c/o. Went to pcp today, was found to be orthostatic, sent to ed for iv and admit.       Past Medical History  Diagnosis Date  . Sarcoidosis   . Breast fibrocystic disorder   . Mitral valve prolapse   . Breast pain     right  . Normal stress echocardiogram Jan 2011  . Normal nuclear stress test December 2012  . Normal echocardiogram December 2012    EF 55 to 60%; mild MVP, grade 1 diastolic dysfunction, PAP 32  . Fibromyalgia   . DOE (dyspnea on exertion)   . Pulmonary fibrosis   . Gastroesophageal reflux disease   . Heart disease     Past Surgical History  Procedure Laterality Date  . Tubal ligation    . Breast lumpectomy  01/12/2011    right    Family History  Problem Relation Age of Onset  . Cardiomyopathy Mother   . Breast cancer Mother   . Heart disease Maternal Aunt   . Prostate cancer Father   . Colon cancer Neg Hx   . Neurofibromatosis Brother     History  Substance Use Topics  . Smoking status: Never Smoker   . Smokeless tobacco: Never Used  . Alcohol Use: No    OB History   Grav Para Term Preterm Abortions TAB SAB Ect Mult Living                   Review of Systems  Constitutional: Negative for fever and chills.  HENT: Negative for neck pain.   Eyes: Negative for visual disturbance.  Respiratory: Negative for cough and shortness of breath.   Cardiovascular: Negative for chest pain.  Gastrointestinal: Positive for vomiting and diarrhea. Negative for abdominal pain.  Genitourinary: Negative for dysuria and flank pain.  Musculoskeletal: Negative for back pain.  Skin: Negative for rash.  Neurological: Negative for headaches.  Hematological: Does not bruise/bleed easily.  Psychiatric/Behavioral: Negative for confusion.    Allergies  Bee venom; Benadryl; and Codeine  Home Medications   Current Outpatient Rx  Name  Route  Sig  Dispense  Refill  . ALPRAZolam (XANAX) 0.5 MG tablet   Oral   Take 0.5 mg by mouth 3 (three) times daily as needed for sleep or anxiety. Take 1/2 to 1 tablet by mouth three times daily as needed for anxiety         . esomeprazole (NEXIUM) 40 MG capsule   Oral   Take 40 mg by mouth daily before breakfast.         . loperamide (IMODIUM) 2 MG capsule   Oral   Take 2 mg by mouth 4 (four) times daily as  needed for diarrhea or loose stools.         . ondansetron (ZOFRAN) 8 MG tablet   Oral   Take 1 tablet (8 mg total) by mouth every 8 (eight) hours as needed for nausea.   6 tablet   1   . predniSONE (DELTASONE) 10 MG tablet   Oral   Take 10 mg by mouth daily.          . pregabalin (LYRICA) 200 MG capsule   Oral   Take 200 mg by mouth daily.         . ciprofloxacin (CIPRO) 500 MG tablet   Oral   Take 500 mg by mouth 2 (two) times daily.         . famotidine (PEPCID) 20 MG tablet   Oral   Take 20 mg by mouth daily as needed for heartburn.            BP 92/47  Pulse 82  Temp(Src) 98.8 F (37.1 C) (Oral)  Resp 20  SpO2 96%  LMP 01/04/2013  Physical Exam  Nursing note and vitals reviewed. Constitutional: She is oriented to person, place, and time. She  appears well-developed. No distress.  HENT:  Mouth/Throat: Oropharynx is clear and moist.  Eyes: Conjunctivae are normal. Pupils are equal, round, and reactive to light. No scleral icterus.  Neck: Neck supple. No tracheal deviation present. No thyromegaly present.  Cardiovascular: Normal rate, regular rhythm, normal heart sounds and intact distal pulses.   Pulmonary/Chest: Effort normal and breath sounds normal. No respiratory distress.  Abdominal: Soft. Normal appearance and bowel sounds are normal. She exhibits no distension and no mass. There is no tenderness. There is no rebound and no guarding.  Genitourinary:  No cva tenderness  Musculoskeletal: She exhibits no edema and no tenderness.  Neurological: She is alert and oriented to person, place, and time.  Skin: Skin is warm and dry. No rash noted. She is not diaphoretic.  Psychiatric: She has a normal mood and affect.    ED Course  Procedures (including critical care time)  Results for orders placed during the hospital encounter of 01/16/13  CBC      Result Value Range   WBC 7.7  4.0 - 10.5 K/uL   RBC 4.38  3.87 - 5.11 MIL/uL   Hemoglobin 12.6  12.0 - 15.0 g/dL   HCT 16.1  09.6 - 04.5 %   MCV 84.0  78.0 - 100.0 fL   MCH 28.8  26.0 - 34.0 pg   MCHC 34.2  30.0 - 36.0 g/dL   RDW 40.9  81.1 - 91.4 %   Platelets 275  150 - 400 K/uL  COMPREHENSIVE METABOLIC PANEL      Result Value Range   Sodium 136  135 - 145 mEq/L   Potassium 3.9  3.5 - 5.1 mEq/L   Chloride 103  96 - 112 mEq/L   CO2 22  19 - 32 mEq/L   Glucose, Bld 117 (*) 70 - 99 mg/dL   BUN 12  6 - 23 mg/dL   Creatinine, Ser 7.82  0.50 - 1.10 mg/dL   Calcium 8.5  8.4 - 95.6 mg/dL   Total Protein 6.3  6.0 - 8.3 g/dL   Albumin 3.6  3.5 - 5.2 g/dL   AST 12  0 - 37 U/L   ALT 18  0 - 35 U/L   Alkaline Phosphatase 50  39 - 117 U/L   Total Bilirubin 0.3  0.3 - 1.2  mg/dL   GFR calc non Af Amer >90  >90 mL/min   GFR calc Af Amer >90  >90 mL/min   Ct Abdomen Pelvis W  Contrast  01/11/2013   *RADIOLOGY REPORT*  Clinical Data: Abdominal discomfort.  Nausea vomiting.  Diarrhea. Sarcoidosis.  CT ABDOMEN AND PELVIS WITH CONTRAST  Technique:  Multidetector CT imaging of the abdomen and pelvis was performed following the standard protocol during bolus administration of intravenous contrast.  Contrast: 100 ml Omnipaque-300 and oral contrast  Comparison: Abdomen MRI of 09/16/2012  Findings: A 1.2 cm low attenuation lesion in the inferior right hepatic lobe is stable compared with previous MRI. Other tiny central sub centimeter probable hepatic cysts are stable.  No new or enlarging liver lesions are seen.  Gallbladder is unremarkable. The pancreas, spleen, adrenal glands, and kidneys are normal appearance.  No evidence of hydronephrosis.  Uterus adnexal regions are unremarkable.  No evidence of lymphadenopathy.  No evidence of inflammatory process or abnormal fluid collections.  No evidence of bowel wall thickening, dilatation, or hernia.  IMPRESSION:  1.  No acute findings. 2.  Stable tiny liver lesions, likely benign.   Original Report Authenticated By: Myles Rosenthal, M.D.       MDM  Iv ns bolus. Labs.   Reviewed nursing notes and prior charts for additional history.   w recurrent nv, ?relative adrenal insuff as not tolerated prednisone.  Will give fluids, solucortef.   2 liters urine. Still no uo/ua.  pts recent urine culture positive pseudomonas - pt indicates was called and told about uti, but no abx ever called in or taken.  Zosyn iv.   Med service called to admit.         Suzi Roots, MD 01/16/13 2009

## 2013-01-17 ENCOUNTER — Encounter: Payer: Self-pay | Admitting: Family Medicine

## 2013-01-17 DIAGNOSIS — R112 Nausea with vomiting, unspecified: Secondary | ICD-10-CM

## 2013-01-17 DIAGNOSIS — D869 Sarcoidosis, unspecified: Secondary | ICD-10-CM

## 2013-01-17 DIAGNOSIS — R197 Diarrhea, unspecified: Secondary | ICD-10-CM

## 2013-01-17 DIAGNOSIS — E860 Dehydration: Secondary | ICD-10-CM

## 2013-01-17 DIAGNOSIS — R109 Unspecified abdominal pain: Secondary | ICD-10-CM

## 2013-01-17 LAB — COMPREHENSIVE METABOLIC PANEL
ALT: 14 U/L (ref 0–35)
AST: 11 U/L (ref 0–37)
Albumin: 2.9 g/dL — ABNORMAL LOW (ref 3.5–5.2)
Alkaline Phosphatase: 41 U/L (ref 39–117)
BUN: 7 mg/dL (ref 6–23)
CO2: 24 mEq/L (ref 19–32)
Calcium: 8.1 mg/dL — ABNORMAL LOW (ref 8.4–10.5)
Chloride: 107 mEq/L (ref 96–112)
Creatinine, Ser: 0.57 mg/dL (ref 0.50–1.10)
GFR calc Af Amer: >90 mL/min (ref >90)
GFR calc non Af Amer: >90 mL/min (ref >90)
Glucose, Bld: 113 mg/dL — ABNORMAL HIGH (ref 70–99)
Potassium: 3.6 mEq/L (ref 3.5–5.1)
Sodium: 138 mEq/L (ref 135–145)
Total Bilirubin: 0.2 mg/dL — ABNORMAL LOW (ref 0.3–1.2)
Total Protein: 5.4 g/dL — ABNORMAL LOW (ref 6.0–8.3)

## 2013-01-17 LAB — CBC
HCT: 32.1 % — ABNORMAL LOW (ref 36.0–46.0)
Hemoglobin: 10.7 g/dL — ABNORMAL LOW (ref 12.0–15.0)
MCH: 28.3 pg (ref 26.0–34.0)
MCHC: 33.3 g/dL (ref 30.0–36.0)
MCV: 84.9 fL (ref 78.0–100.0)
Platelets: 252 10*3/uL (ref 150–400)
RBC: 3.78 MIL/uL — ABNORMAL LOW (ref 3.87–5.11)
RDW: 15.4 % (ref 11.5–15.5)
WBC: 5.2 10*3/uL (ref 4.0–10.5)

## 2013-01-17 LAB — CLOSTRIDIUM DIFFICILE BY PCR: Toxigenic C. Difficile by PCR: NEGATIVE

## 2013-01-17 MED ORDER — SODIUM CHLORIDE 0.9 % IV SOLN
INTRAVENOUS | Status: DC
  Administered 2013-01-17 – 2013-01-19 (×5): via INTRAVENOUS

## 2013-01-17 MED ORDER — SODIUM CHLORIDE 0.9 % IV BOLUS (SEPSIS)
500.0000 mL | Freq: Once | INTRAVENOUS | Status: AC
  Administered 2013-01-17: 500 mL via INTRAVENOUS

## 2013-01-17 MED ORDER — LOPERAMIDE HCL 2 MG PO CAPS
2.0000 mg | ORAL_CAPSULE | Freq: Three times a day (TID) | ORAL | Status: DC | PRN
  Administered 2013-01-17: 2 mg via ORAL
  Filled 2013-01-17: qty 1

## 2013-01-17 NOTE — Progress Notes (Signed)
     Subjective: This lady feels somewhat better than when she was admitted. She is approximately a 3 week history of nausea vomiting and diarrhea. She does have a Pseudomonas UTI which has not really been treated until now.           Physical Exam: Blood pressure 100/55, pulse 61, temperature 97.7 F (36.5 C), temperature source Oral, resp. rate 18, height 5\' 10"  (1.778 m), weight 91.6 kg (201 lb 15.1 oz), last menstrual period 01/04/2013, SpO2 93.00%. She does look systemically well. However, her radial pulses somewhat weak and her blood pressure is soft. Abdomen is soft and nontender. She is alert and orientated.   Investigations:  Recent Results (from the past 240 hour(s))  URINE CULTURE     Status: None   Collection Time    01/11/13 10:30 PM      Result Value Range Status   Specimen Description URINE, CLEAN CATCH   Final   Special Requests NONE   Final   Culture  Setup Time 01/11/2013 23:00   Final   Colony Count >=100,000 COLONIES/ML   Final   Culture PSEUDOMONAS AERUGINOSA   Final   Report Status 01/13/2013 FINAL   Final   Organism ID, Bacteria PSEUDOMONAS AERUGINOSA   Final  CLOSTRIDIUM DIFFICILE BY PCR     Status: None   Collection Time    01/16/13 11:37 PM      Result Value Range Status   C difficile by pcr NEGATIVE  NEGATIVE Final     Basic Metabolic Panel:  Recent Labs  16/10/96 1833 01/17/13 0655  NA 136 138  K 3.9 3.6  CL 103 107  CO2 22 24  GLUCOSE 117* 113*  BUN 12 7  CREATININE 0.68 0.57  CALCIUM 8.5 8.1*   Liver Function Tests:  Recent Labs  01/16/13 1833 01/17/13 0655  AST 12 11  ALT 18 14  ALKPHOS 50 41  BILITOT 0.3 0.2*  PROT 6.3 5.4*  ALBUMIN 3.6 2.9*     CBC:  Recent Labs  01/16/13 1833 01/17/13 0655  WBC 7.7 5.2  HGB 12.6 10.7*  HCT 36.8 32.1*  MCV 84.0 84.9  PLT 275 252        Medications: I have reviewed the patient's current medications.  Impression: 1. Nausea vomiting and now diarrhea, unclear  etiology. Possibilities include side effects from immunosuppressive medications for sarcoidosis versus viral infection. 2. Hypokalemia with soft blood pressure secondary to #1. 3. Sarcoidosis on steroids.     Plan: 1. Continue supportive care. Give normal saline bolus for blood pressure. 2. Await gastroenterology opinion.     LOS: 1 day   Wilson Singer Pager [phone]  01/17/2013, 9:59 AM

## 2013-01-17 NOTE — Consult Note (Signed)
Kathleen [name], Kathleen [name] NO.:  [id number]  MEDICAL RECORD NO.:  [id number]  LOCATION:  A334                          FACILITY:  [facility]  PHYSICIAN:  Lionel December, M.D.    DATE OF BIRTH:  26-Dec-1960  DATE OF CONSULTATION:  01/17/2013 DATE OF DISCHARGE:                                CONSULTATION   CONSULTING PHYSICIAN:  Wilson Singer, MD.  PRIMARY CARE PHYSICIAN:  W Simone Curia, MD.  REASON FOR CONSULTATION:  Nausea-vomiting and diarrhea.  HISTORY OF PRESENT ILLNESS:  The patient is a 52 year old Caucasian female who was admitted to Dr. Karilyn Cota' service yesterday via emergency room where she presented with nausea vomiting, diarrhea, and profound weakness.  The patient has history of progressive pulmonary sarcoidosis.  She is being evaluated and treated at [facility] by Dr. Dudley Major.  Last time, she was begun on prednisone and CellCept.  Her present symptoms began about 3 weeks ago.  Her husband states that she had nausea-vomiting for about a week before she developed diarrhea.  She has had as many as six- seven stools on her worse days.  Stools described to be loose and watery.  She denies hematemesis, melena, or rectal bleeding.  She also denies abdominal pain.  She states she has lost 15 pounds.  She complains of profound weakness.  She has not been able to function at home.  She feels her shortness of breath has improved slightly since she has been on prednisone.  She also has history of chest pain which was finally felt to be due to fibromyalgia.  She is not having any pain at the present time.  She was treated with Bactrim which she took for 10 days.  She was seen in the emergency room 6 days ago and had urine testing.  She was found to have Pseudomonas in her urine and she is now being treated.  However, she denies dysuria or increased frequency of micturition.  The patient reports that she has chronic constipation and she would be lucky if she had  two bowel movements per week.  She has had constipation most of her life.  She has been taking fiber supplements and FiberCon, but does not take any laxatives.  She is being treated with IV fluids.  Stool C diff by PCR is negative.  Other stool studies are pending.  MEDICATIONS:  Home meds include, 1. Alprazolam 0.5 mg 3 times a day as needed for anxiety and her to     sleep. 2. Nexium 40 mg p.o. q.a.m. 3. Imodium 2 mg every 6 p.r.n. 4. Ondansetron 8 mg p.o. every 8 hours p.r.n. 5. Prednisone 60 mg a day. 6. Lyrica 200 mg p.o. daily. 7. Cipro 500 mg p.o. b.i.d. 8. Pepcid 20 mg p.o. daily p.r.n. for breakthrough heartburn.  Current medications include, 1. Acetaminophen 650 mg p.o. every 6 hours p.r.n. 2. Alprazolam 0.25-0.5 mg t.i.d. p.r.n. 3. Cefepime 1 g IV every 12 hours. 4. Enoxaparin 40 mg subcutaneous every 24 hours. 5. Solu-Cortef 250 mg every 6 hours. 6. Ondansetron 4 mg p.o. or IV every 6 hours p.r.n. 7. Pantoprazole 80 mg p.o. q.a.m. 8. Lyrica 200 mg p.o.  daily. 9. She is also receiving IV fluids.  PAST MEDICAL HISTORY:  She was diagnosed with pulmonary sarcoidosis in 1994.  She apparently was in remission or disease was inactive, but she has had problems since the fall of 2013.  She is now being treated by Dr. Dudley Major of [facility], and was told that 40% of her lung function is lost.  She has not been on immunosuppressive therapy as above, but CellCept was discontinued over a week ago.  She has mitral valve prolapse.  Fibromyalgia was diagnosed in January 2014.  She has had GERD symptoms for 5 years.  She had EGD by Dr. Stan Head in 2013 because of nausea and vomiting was a normal exam.  Gastric biopsy was negative for sarcoidosis which showed H pylori gastritis and she was treated. She had tubal ligation in 1990 and she has had multiple benign lumps removed from her right breast mass (5 or 6 times).  ALLERGIES:  Benadryl and codeine.  She is also allergic to  bee venom.  FAMILY HISTORY:  Both parents are in good health, mother is 15 and father is 59.  She has a brother and a sister also in good health.  SOCIAL HISTORY:  She is married (second marriage).  She has one daughter from her first marriage and she has two stepchildren.  Her husband, Deshawna Mcneece, is the room with her.  She worked at [employer] for 1 year in late '80s.  She has been with [facility] since 1989 and she is on short-term disability since February 2014.  PHYSICAL EXAMINATION:  VITAL SIGNS:  Admission weight 195 pounds and 6 ounces.  She is 70 inches tall.  Pulse 60 per minute and regular.  Blood pressure 118/55.  Respiration rate 18.  Temp is 97.4. GENERAL:  The patient is alert and in no acute distress, but she appears to be chronically ill. HEENT:  Conjunctivae pink.  Sclerae nonicteric.  Oropharyngeal mucosa is normal. NECK:  No neck masses or thyromegaly noted. CARDIAC:  Regular rhythm.  Normal S1, S2.  No murmur or gallop noted. LUNGS:  Auscultation of lungs reveal vesicular breath sounds. Diminished intensity at bases and few rales. ABDOMEN:  Protuberant.  Bowel sounds are normal.  On palpation, soft abdomen with mild generalized tenderness.  No organomegaly or masses noted. EXTREMITIES:  No peripheral edema or clubbing noted.  LABORATORY DATA:  From admission, WBC 7.7, hemoglobin 12.6, hematocrit 36.8, platelet count 275,000.  Serum sodium 136, potassium 3.9, chloride 103, CO2 of 20, glucose 117, BUN 12, creatinine 0.68, calcium 8.5. Bilirubin 0.3, AP 50, AST 12, ALT 18, albumin 3.6.  C diff by PCR is negative.  H and H from this morning is 10.7 and 32.1.  Her albumin from this morning was 2.9.  Other data that was reviewed, Upper abdominal ultrasound, September 11, 2012, negative for cholelithiasis or dilated bile duct.  She had hyperechoic area in the left lobe of the liver measuring 56 x 47 x 32 mm.  This was further evaluated with MR on  September 16, 2012 which revealed lesion in inferior aspect of the right hepatic lobe, technically indeterminate, but favored to be hemangioma given T2 hyperintensity and delayed hyperenhancement.  She had abdominopelvic CT with contrast when she was in emergency room on Jan 11, 2013.  No acute abnormality noted and previously described liver lesion is stable.  ASSESSMENT:  The patient is a 52 year old, Caucasian female, who presents with a 3-week history of nausea and  vomiting, and about 2-week history of nonbloody diarrhea.  She has profound weakness.  The patient has fairly advanced pulmonary sarcoidosis and has been on high-dose prednisone and CellCept. CellCept was discontinued over a week ago with the thought that the symptoms could be due to CellCept.  However on stopping this medication, her symptoms have not resolved.  She was treated with Bactrim recently.  Her stool C diff by PCR is negative.  Other stool test results are pending.  She could have small bowel disease which would explain all of her symptoms or she could have two conditions to account for nausea-vomiting and diarrhea.  No abdominal adenopathy noted on recent CT.  Given that she is immunosuppressed, she needs to be evaluated for pathogens that are usually found in immunodeficient patient as you are doing.  I believe we need to also go ahead and get GI pathogen panel.  If all of these studies are negative, she may need a colonoscopy.  She has chronic GERD, but symptoms are well controlled with PPI and p.r.n. H2B.  RECOMMENDATIONS: 1. Advance diet to full liquids. 2. GI pathogen panel. 3. HIDA scan with CCK in a.m. 4. We will consider colonoscopy and small bowel follow-through if     stool studies are negative. 5. The patient will be re-evaluated in a.m.  We appreciate the opportunity to participate in the care this nice lady.          ______________________________ Lionel December, M.D.     NR/MEDQ   D:  01/17/2013  T:  01/17/2013  Job:  [id number]

## 2013-01-17 NOTE — Progress Notes (Signed)
Patient's BP has remained low 83/49 this AM. MD notified.

## 2013-01-17 NOTE — Progress Notes (Signed)
Nutrition Brief Note  Patient identified on the Malnutrition Screening Tool (MST) Report.   Wt Readings from Last 10 Encounters:  01/16/13 201 lb 15.1 oz (91.6 kg)  01/16/13 195 lb 6 oz (88.622 kg)  01/11/13 210 lb (95.255 kg)  01/08/13 204 lb 9.6 oz (92.806 kg)  01/01/13 201 lb 9.6 oz (91.445 kg)  11/26/12 192 lb (87.091 kg)  11/12/12 193 lb (87.544 kg)  08/18/12 180 lb (81.647 kg)  05/19/12 186 lb (84.369 kg)  05/15/12 186 lb (84.369 kg)   Chart reviewed. Pt with hx of weight gain. UBW: 185-190#. Noted 9# (4.7%) x 1 month. Also with 3# (1.5%) wt loss x 1 week, which is not clinically significant.  Body mass index is 28.98 kg/(m^2). Patient meets criteria for overweight based on current BMI.   Current diet order is clear liquid, patient is consuming approximately n/a% of meals at this time. Labs and medications reviewed.   No nutrition interventions warranted at this time. If nutrition issues arise, please consult RD.   Melody Haver, RD, LDN Pager: [phone]

## 2013-01-17 NOTE — Consult Note (Signed)
Note to be dictated. Patient with complicated history who presents with nausea vomiting and diarrhea vomiting started one week prior to onset of diarrhea. No improvement reported with CellCept which was discontinued about a week ago. Stool studies are pending. She apparently had EGD back in September 2013 by Dr. Stan Head for nausea vomiting and gastric biopsy was negative for sarcoid. No other abnormalities were noted. She is being checked for common and uncommon pathogens. Will also do GI pathogen panel. Patient will be reevaluated in a.m.

## 2013-01-18 ENCOUNTER — Inpatient Hospital Stay (HOSPITAL_COMMUNITY): Payer: 59

## 2013-01-18 LAB — BASIC METABOLIC PANEL
BUN: 5 mg/dL — ABNORMAL LOW (ref 6–23)
CO2: 28 mEq/L (ref 19–32)
Calcium: 7.9 mg/dL — ABNORMAL LOW (ref 8.4–10.5)
Chloride: 112 mEq/L (ref 96–112)
Creatinine, Ser: 0.6 mg/dL (ref 0.50–1.10)
GFR calc Af Amer: >90 mL/min (ref >90)
GFR calc non Af Amer: >90 mL/min (ref >90)
Glucose, Bld: 90 mg/dL (ref 70–99)
Potassium: 3.4 mEq/L — ABNORMAL LOW (ref 3.5–5.1)
Sodium: 143 mEq/L (ref 135–145)

## 2013-01-18 LAB — TSH: TSH: 0.602 u[IU]/mL (ref 0.350–4.500)

## 2013-01-18 MED ORDER — SIMETHICONE 40 MG/0.6ML PO SUSP
40.0000 mg | Freq: Four times a day (QID) | ORAL | Status: DC | PRN
  Administered 2013-01-18 – 2013-01-19 (×2): 40 mg via ORAL
  Filled 2013-01-18: qty 30

## 2013-01-18 MED ORDER — HYDROCORTISONE SOD SUCCINATE 100 MG IJ SOLR
100.0000 mg | Freq: Three times a day (TID) | INTRAMUSCULAR | Status: DC
  Administered 2013-01-18 – 2013-01-21 (×9): 100 mg via INTRAVENOUS
  Filled 2013-01-18 (×9): qty 2

## 2013-01-18 NOTE — Progress Notes (Addendum)
  Kathleen [name] YNW:[id number] DOB: 03-18-1961 DOA: 01/16/2013 PCP: [name], MD   Subjective: This lady feels her abdomen is more swollen today. She feels nauseous.           Physical Exam: Blood pressure 88/51, pulse 58, temperature 97.6 F (36.4 C), temperature source Oral, resp. rate 18, height 5\' 10"  (1.778 m), weight 91.6 kg (201 lb 15.1 oz), last menstrual period 01/04/2013, SpO2 95.00%. She looks systemically well despite the soft blood pressure, she does not look like she is in hypovolemic shock. Abdomen is distended and bowel sounds are active. There is generalized minimal tenderness. Lung fields are clear. She is alert and oriented.   Investigations:  Recent Results (from the past 240 hour(s))  URINE CULTURE     Status: None   Collection Time    01/11/13 10:30 PM      Result Value Range Status   Specimen Description URINE, CLEAN CATCH   Final   Special Requests NONE   Final   Culture  Setup Time 01/11/2013 23:00   Final   Colony Count >=100,000 COLONIES/ML   Final   Culture PSEUDOMONAS AERUGINOSA   Final   Report Status 01/13/2013 FINAL   Final   Organism ID, Bacteria PSEUDOMONAS AERUGINOSA   Final  CLOSTRIDIUM DIFFICILE BY PCR     Status: None   Collection Time    01/16/13 11:37 PM      Result Value Range Status   C difficile by pcr NEGATIVE  NEGATIVE Final     Basic Metabolic Panel:  Recent Labs  65/78/46 0655 01/18/13 0805  NA 138 143  K 3.6 3.4*  CL 107 112  CO2 24 28  GLUCOSE 113* 90  BUN 7 5*  CREATININE 0.57 0.60  CALCIUM 8.1* 7.9*   Liver Function Tests:  Recent Labs  01/16/13 1833 01/17/13 0655  AST 12 11  ALT 18 14  ALKPHOS 50 41  BILITOT 0.3 0.2*  PROT 6.3 5.4*  ALBUMIN 3.6 2.9*     CBC:  Recent Labs  01/16/13 1833 01/17/13 0655  WBC 7.7 5.2  HGB 12.6 10.7*  HCT 36.8 32.1*  MCV 84.0 84.9  PLT 275 252        Medications: I have reviewed the patient's current medications.  Impression: 1. Pseudomonas  UTI, being treated currently. 2. Nausea, vomiting and diarrhea, unclear etiology. This is likely multifactorial, immunosuppressive medications versus viral infection versus other etiologies. 3. Sarcoidosis on steroids.     Plan: 1. Increase intravenous hydrocortisone. With these IV fluids as renal function is normal. 2. KUB to look her abdomen swelling. 3. Appreciate gastroenterology consultation. She will require a HIDA scan.  Consultants:  Gastroenterology, Dr. Karilyn Cota   Procedures:  None.   Antibiotics:  Maxipime IV started on 01/16/2013.                   Code Status: Full code.  Family Communication: Discussed plan with patient at the bedside.   Disposition Plan: Home when medically stable.  Time spent: 20 minutes.   LOS: 2 days   Wilson Singer Pager [phone]  01/18/2013, 11:11 AM

## 2013-01-18 NOTE — Progress Notes (Signed)
Pt's BP 88/46. MD paged, no new orders given at this time. Will continue to monitor Pt.

## 2013-01-18 NOTE — Progress Notes (Signed)
Pt c/o of feeling bloated and having a lot of gas pressure. MD paged and new orders given.

## 2013-01-18 NOTE — Progress Notes (Signed)
Subjective; Patient continues to complain of nausea. She woke up with nausea and was somewhat worse after breakfast but no vomiting. She complains of bloating and soreness. She has not had diarrhea in the last 24 hours. She is passing a lot of flatus. She continues to complain of feeling weak. She would like to have her only scattered removed. He continues to complain of feeling weak. Objective; BP 88/51  Pulse 58  Temp(Src) 97.6 F (36.4 C) (Oral)  Resp 18  Ht 5\' 10"  (1.778 m)  Wt 201 lb 15.1 oz (91.6 kg)  BMI 28.98 kg/m2  SpO2 95%  LMP 01/04/2013 Ration does not appear to be in acute distress. Abdomen is protuberant. Bowel sounds are present. She has mild generalized tenderness. Lab data; Stool studies are pending except C. difficile is negative. Serum sodium 143, potassium 3.4, chloride 112, CO2 28, BUN 5, creatinine 0.6 and glucose 90. Serum calcium 7.9. Assessment; Diarrhea seemed to have gotten better. She only received single dose of loperamide since admission. Stool studies are still pending including GI pathogen panel. Nausea appears to be her major symptom. She seemed to have more nausea after meals. Need to make sure she does not have GB disease. Recommendations; Advance diet to  bland diet Increase steroid dose given that she is acutely ill(stress dose). Celiac antibody panel. HIDA scan.

## 2013-01-19 ENCOUNTER — Encounter (HOSPITAL_COMMUNITY): Payer: 59

## 2013-01-19 ENCOUNTER — Encounter (HOSPITAL_COMMUNITY): Payer: Self-pay

## 2013-01-19 ENCOUNTER — Inpatient Hospital Stay (HOSPITAL_COMMUNITY): Payer: 59

## 2013-01-19 LAB — CBC
HCT: 30.5 % — ABNORMAL LOW (ref 36.0–46.0)
Hemoglobin: 10.3 g/dL — ABNORMAL LOW (ref 12.0–15.0)
MCH: 28.9 pg (ref 26.0–34.0)
MCHC: 33.8 g/dL (ref 30.0–36.0)
MCV: 85.7 fL (ref 78.0–100.0)
Platelets: 211 10*3/uL (ref 150–400)
RBC: 3.56 MIL/uL — ABNORMAL LOW (ref 3.87–5.11)
RDW: 15.6 % — ABNORMAL HIGH (ref 11.5–15.5)
WBC: 5.3 10*3/uL (ref 4.0–10.5)

## 2013-01-19 LAB — BASIC METABOLIC PANEL
BUN: 11 mg/dL (ref 6–23)
CO2: 27 mEq/L (ref 19–32)
Calcium: 7.9 mg/dL — ABNORMAL LOW (ref 8.4–10.5)
Chloride: 107 mEq/L (ref 96–112)
Creatinine, Ser: 0.63 mg/dL (ref 0.50–1.10)
GFR calc Af Amer: >90 mL/min (ref >90)
GFR calc non Af Amer: >90 mL/min (ref >90)
Glucose, Bld: 113 mg/dL — ABNORMAL HIGH (ref 70–99)
Potassium: 3.2 mEq/L — ABNORMAL LOW (ref 3.5–5.1)
Sodium: 140 mEq/L (ref 135–145)

## 2013-01-19 MED ORDER — SIMETHICONE 80 MG PO CHEW
160.0000 mg | CHEWABLE_TABLET | Freq: Four times a day (QID) | ORAL | Status: DC | PRN
  Administered 2013-01-19: 160 mg via ORAL
  Filled 2013-01-19: qty 2

## 2013-01-19 MED ORDER — TECHNETIUM TC 99M MEBROFENIN IV KIT
5.0000 | PACK | Freq: Once | INTRAVENOUS | Status: AC | PRN
  Administered 2013-01-19: 5 via INTRAVENOUS

## 2013-01-19 MED ORDER — METOCLOPRAMIDE HCL 5 MG/ML IJ SOLN
10.0000 mg | INTRAMUSCULAR | Status: AC
  Administered 2013-01-19: 10 mg via INTRAVENOUS
  Filled 2013-01-19: qty 2

## 2013-01-19 MED ORDER — METOCLOPRAMIDE HCL 5 MG/ML IJ SOLN
10.0000 mg | Freq: Three times a day (TID) | INTRAMUSCULAR | Status: DC
  Administered 2013-01-19 – 2013-01-21 (×5): 10 mg via INTRAVENOUS
  Filled 2013-01-19 (×5): qty 2

## 2013-01-19 MED ORDER — POTASSIUM CHLORIDE IN NACL 40-0.9 MEQ/L-% IV SOLN
INTRAVENOUS | Status: DC
  Administered 2013-01-19: 11:00:00 via INTRAVENOUS

## 2013-01-19 MED ORDER — SINCALIDE 5 MCG IJ SOLR
INTRAMUSCULAR | Status: AC
  Administered 2013-01-19: 1.82 ug
  Filled 2013-01-19: qty 5

## 2013-01-19 MED ORDER — GLYCERIN (LAXATIVE) 2.1 G RE SUPP: 1.0000 | Freq: Once | RECTAL | Status: DC

## 2013-01-19 MED ORDER — FLEET ENEMA 7-19 GM/118ML RE ENEM
1.0000 | ENEMA | Freq: Once | RECTAL | Status: AC
  Administered 2013-01-19: 1 via RECTAL

## 2013-01-19 MED ORDER — SIMETHICONE 80 MG PO CHEW
160.0000 mg | CHEWABLE_TABLET | Freq: Four times a day (QID) | ORAL | Status: DC
  Administered 2013-01-20 – 2013-01-21 (×5): 160 mg via ORAL
  Filled 2013-01-19 (×6): qty 2

## 2013-01-19 NOTE — Progress Notes (Signed)
UR Chart Review Completed  

## 2013-01-19 NOTE — Progress Notes (Signed)
  Kathleen [name] NGE:[id number] DOB: [birth year] DOA: 01/16/2013 PCP: [name], MD   Subjective: This lady feels somewhat better today. She wants to eat. She has just come back from her scan.           Physical Exam: Blood pressure 102/64, pulse 58, temperature 97.4 F (36.3 C), temperature source Oral, resp. rate 16, height 5\' 10"  (1.778 m), weight 91.6 kg (201 lb 15.1 oz), last menstrual period 01/04/2013, SpO2 99.00%. She looks systemically well despite the soft blood pressure, she does not look like she is in hypovolemic shock. Abdomen is less distended and bowel sounds are active. There is no tenderness today. Lung fields are clear. She is alert and oriented.   Investigations:  Recent Results (from the past 240 hour(s))  URINE CULTURE     Status: None   Collection Time    01/11/13 10:30 PM      Result Value Range Status   Specimen Description URINE, CLEAN CATCH   Final   Special Requests NONE   Final   Culture  Setup Time 01/11/2013 23:00   Final   Colony Count >=100,000 COLONIES/ML   Final   Culture PSEUDOMONAS AERUGINOSA   Final   Report Status 01/13/2013 FINAL   Final   Organism ID, Bacteria PSEUDOMONAS AERUGINOSA   Final  CLOSTRIDIUM DIFFICILE BY PCR     Status: None   Collection Time    01/16/13 11:37 PM      Result Value Range Status   C difficile by pcr NEGATIVE  NEGATIVE Final     Basic Metabolic Panel:  Recent Labs  40/10/27 0805 01/19/13 0617  NA 143 140  K 3.4* 3.2*  CL 112 107  CO2 28 27  GLUCOSE 90 113*  BUN 5* 11  CREATININE 0.60 0.63  CALCIUM 7.9* 7.9*   Liver Function Tests:  Recent Labs  01/16/13 1833 01/17/13 0655  AST 12 11  ALT 18 14  ALKPHOS 50 41  BILITOT 0.3 0.2*  PROT 6.3 5.4*  ALBUMIN 3.6 2.9*     CBC:  Recent Labs  01/17/13 0655 01/19/13 0617  WBC 5.2 5.3  HGB 10.7* 10.3*  HCT 32.1* 30.5*  MCV 84.9 85.7  PLT 252 211        Medications: I have reviewed the patient's current  medications.  Impression: 1. Pseudomonas UTI, being treated currently. 2. Nausea, vomiting and diarrhea, unclear etiology. This is likely multifactorial, immunosuppressive medications versus viral infection versus other etiologies. 3. Sarcoidosis on steroids.     Plan: 1. Await the result of the HIDA scan. 2. Patient can have a bland diet. Consultants:  Gastroenterology, Dr. Karilyn Cota   Procedures:  None.   Antibiotics:  Maxipime IV started on 01/16/2013.                   Code Status: Full code.  Family Communication: Discussed plan with patient at the bedside.   Disposition Plan: Home when medically stable.  Time spent: 20 minutes.   LOS: 3 days   Wilson Singer Pager [phone]  01/19/2013, 10:44 AM

## 2013-01-19 NOTE — Progress Notes (Signed)
Patient reports excessive bloating after she ate less than half of her lunch. She feels miserable. She felt much better this morning. She has not had a bowel movement in over 24 hours. Abdomen is distended with normal bowel sounds and mild generalized tenderness. Percussion note is tympanitic across upper abdomen. HIDA scan with CCK is within normal limits. EF almost 79%. Patient's bloating is possibly multifactorial. She could have gastroparesis or small bowel bacterial overgrowth. She possibly also has aerophagia. Recommendations; Change Mylicon dose to 160 mg 4 times a day x6 doses. Reglan 10 mg IV x4 doses. Await results of celiac antibody panel and stool studies.

## 2013-01-20 LAB — FECAL LACTOFERRIN, QUANT: Fecal Lactoferrin: POSITIVE

## 2013-01-20 LAB — BASIC METABOLIC PANEL
BUN: 12 mg/dL (ref 6–23)
CO2: 28 mEq/L (ref 19–32)
Calcium: 8 mg/dL — ABNORMAL LOW (ref 8.4–10.5)
Chloride: 106 mEq/L (ref 96–112)
Creatinine, Ser: 0.59 mg/dL (ref 0.50–1.10)
GFR calc Af Amer: >90 mL/min (ref >90)
GFR calc non Af Amer: >90 mL/min (ref >90)
Glucose, Bld: 118 mg/dL — ABNORMAL HIGH (ref 70–99)
Potassium: 3.8 mEq/L (ref 3.5–5.1)
Sodium: 139 mEq/L (ref 135–145)

## 2013-01-20 LAB — RETICULIN ANTIBODIES, IGA W TITER: Reticulin Ab, IgA: NEGATIVE

## 2013-01-20 MED ORDER — FLEET ENEMA 7-19 GM/118ML RE ENEM
1.0000 | ENEMA | Freq: Once | RECTAL | Status: AC
  Administered 2013-01-20: 1 via RECTAL

## 2013-01-20 NOTE — Progress Notes (Signed)
Pt very apprehensive about possible discharge for tomorrow. She doesn't understand how to manage her symptoms at home. I explained that walking and drinking fluids can help to promote regular bowel movements. I also helped her think of some questions to ask the doctor regarding symptom management when she sees him tomorrow morning. She mainly wants to get across that her distension is not resolving. An enema helps a little, but her simethicone (Mylicon) seems to have little to no affect. Also she feels that steroid is the reason for her distension and discomfort. We wrote down some questions for the doctor for when he comes to see the pt in the morning, which made her feel more at ease about everything. Will continue to monitor.

## 2013-01-20 NOTE — Care Management Note (Signed)
    Page 1 of 1   01/21/2013     11:21:30 AM   CARE MANAGEMENT NOTE 01/21/2013  Patient:  MAO, LOCKNER   Account Number:  [id number]  Date Initiated:  01/20/2013  Documentation initiated by:  Sharrie Rothman  Subjective/Objective Assessment:   Pt admitted from home with UTI and diarrhea. Pt lives with her husband and is fairly indpendent with ADL's. Pt has been out of work for 14 weeks due to various illnesses.     Action/Plan:   Cm spoke with pt about resources since pt is out of work. Pt gets medications from [facility] outpt pharmacy. Pt asked about food vouchers and CM recommended pt to check with DSS for help.   Anticipated DC Date:  01/22/2013   Anticipated DC Plan:  HOME/SELF CARE      DC Planning Services  CM consult      Choice offered to / List presented to:             Status of service:  Completed, signed off Medicare Important Message given?   (If response is "NO", the following Medicare IM given date fields will be blank) Date Medicare IM given:   Date Additional Medicare IM given:    Discharge Disposition:  HOME/SELF CARE  Per UR Regulation:    If discussed at Long Length of Stay Meetings, dates discussed:    Comments:  01/21/13 1120 Arlyss Queen, RN BSN CM Pt discharged home today. Drug card given to pt but knows that it cannot be used wit the [facility]. No other CM needs noted.  01/20/13 1602 Arlyss Queen, RN BSN CM

## 2013-01-20 NOTE — Progress Notes (Signed)
Subjective; Patient continues to complain of bloating. She says by late afternoon she feels as you she is 6 months pregnant. She feels the best in the morning. She finally had a bowel movement after using Fleet enema. She ate all of her breakfast this morning. She did feel any better with IV Reglan. Objective; BP 110/63  Pulse 54  Temp(Src) 97.5 F (36.4 C) (Oral)  Resp 16  Ht 5\' 10"  (1.778 m)  Wt 201 lb 15.1 oz (91.6 kg)  BMI 28.98 kg/m2  SpO2 96%  LMP 01/04/2013 Patient remains very anxious. She is in no acute distress. Abdomen is protuberant but soft and nontender. Lab data; Fecal lactoferrin positive. Stool culture preliminary no pathogens. Celiac antibody panel and other studies are pending. Assessment; Diarrhea has resolved. As a matter of fact she required fleets enema yesterday for relief. Her baseline is one of chronic constipation. Bloating. This symptom appears to be quite distressing to her. Her bloating appears to be multifactorial. If celiac antibody panel is negative she may have to be evaluated for small bowel disease or bacterial overgrowth and we should also rule out gastroparesis. Recommendations; Patient advised to take MiraLax daily when she goes home. Will arrange for office visit when all stool studies are back. She will need to call Dr. Veva Holes  office to review immunosuppressive therapy

## 2013-01-20 NOTE — Progress Notes (Signed)
  Kathleen [name]:[id number] DOB: [birth year] DOA: 01/16/2013 PCP: [name], MD   Subjective: This lady feels intermittently better and then worse again. She does significant bloating her abdomen. Fortunately, the diarrhea does seem to have stopped. Yesterday, she was frankly constipated and required Fleet Enema before she obtained relief from her distended abdomen. There is no vomiting.           Physical Exam: Blood pressure 110/63, pulse 54, temperature 97.5 F (36.4 C), temperature source Oral, resp. rate 16, height 5\' 10"  (1.778 m), weight 91.6 kg (201 lb 15.1 oz), last menstrual period 01/04/2013, SpO2 96.00%. She looks systemically well. Abdomen is distended and bowel sounds are active. There is no tenderness today. Lung fields are clear. She is alert and oriented.   Investigations:  Recent Results (from the past 240 hour(s))  URINE CULTURE     Status: None   Collection Time    01/11/13 10:30 PM      Result Value Range Status   Specimen Description URINE, CLEAN CATCH   Final   Special Requests NONE   Final   Culture  Setup Time 01/11/2013 23:00   Final   Colony Count >=100,000 COLONIES/ML   Final   Culture PSEUDOMONAS AERUGINOSA   Final   Report Status 01/13/2013 FINAL   Final   Organism ID, Bacteria PSEUDOMONAS AERUGINOSA   Final  CLOSTRIDIUM DIFFICILE BY PCR     Status: None   Collection Time    01/16/13 11:37 PM      Result Value Range Status   C difficile by pcr NEGATIVE  NEGATIVE Final  STOOL CULTURE     Status: None   Collection Time    01/16/13 11:37 PM      Result Value Range Status   Specimen Description STOOL   Final   Special Requests Immunocompromised   Final   Culture     Final   Value: NO SUSPICIOUS COLONIES, CONTINUING TO HOLD     Note: REDUCED NORMAL FLORA PRESENT   Report Status PENDING   Incomplete     Basic Metabolic Panel:  Recent Labs  81/19/14 0617 01/20/13 0627  NA 140 139  K 3.2* 3.8  CL 107 106  CO2 27 28  GLUCOSE 113*  118*  BUN 11 12  CREATININE 0.63 0.59  CALCIUM 7.9* 8.0*       CBC:  Recent Labs  01/19/13 0617  WBC 5.3  HGB 10.3*  HCT 30.5*  MCV 85.7  PLT 211        Medications: I have reviewed the patient's current medications.  Impression: 1. Pseudomonas UTI, being treated currently. 2. Nausea, vomiting and diarrhea, unclear etiology. This is likely multifactorial, immunosuppressive medications versus viral infection versus other etiologies. 3. Sarcoidosis on steroids.     Plan: 1. Discontinue IV fluids and saline lock. Encourage oral intake. 2. Possible discharge home tomorrow and further investigation as an outpatient. Consultants:  Gastroenterology, Dr. Karilyn Cota   Procedures:  None.   Antibiotics:  Maxipime IV started on 01/16/2013.                   Code Status: Full code.  Family Communication: Discussed plan with patient at the bedside.   Disposition Plan: Home when medically stable.  Time spent: 20 minutes.   LOS: 4 days   Wilson Singer Pager [phone]  01/20/2013, 10:41 AM

## 2013-01-21 ENCOUNTER — Encounter (HOSPITAL_COMMUNITY): Payer: 59

## 2013-01-21 LAB — COMPREHENSIVE METABOLIC PANEL
ALT: 20 U/L (ref 0–35)
AST: 17 U/L (ref 0–37)
Albumin: 2.6 g/dL — ABNORMAL LOW (ref 3.5–5.2)
Alkaline Phosphatase: 39 U/L (ref 39–117)
BUN: 10 mg/dL (ref 6–23)
CO2: 34 mEq/L — ABNORMAL HIGH (ref 19–32)
Calcium: 8.4 mg/dL (ref 8.4–10.5)
Chloride: 104 mEq/L (ref 96–112)
Creatinine, Ser: 0.64 mg/dL (ref 0.50–1.10)
GFR calc Af Amer: >90 mL/min (ref >90)
GFR calc non Af Amer: >90 mL/min (ref >90)
Glucose, Bld: 107 mg/dL — ABNORMAL HIGH (ref 70–99)
Potassium: 3.2 mEq/L — ABNORMAL LOW (ref 3.5–5.1)
Sodium: 141 mEq/L (ref 135–145)
Total Bilirubin: 0.1 mg/dL — ABNORMAL LOW (ref 0.3–1.2)
Total Protein: 5.1 g/dL — ABNORMAL LOW (ref 6.0–8.3)

## 2013-01-21 LAB — CRYPTOSPORIDIUM SMEAR, FECAL: Cryptosporidium Smear.: NONE SEEN

## 2013-01-21 LAB — CBC
HCT: 32.6 % — ABNORMAL LOW (ref 36.0–46.0)
Hemoglobin: 10.9 g/dL — ABNORMAL LOW (ref 12.0–15.0)
MCH: 28.5 pg (ref 26.0–34.0)
MCHC: 33.4 g/dL (ref 30.0–36.0)
MCV: 85.1 fL (ref 78.0–100.0)
Platelets: 254 10*3/uL (ref 150–400)
RBC: 3.83 MIL/uL — ABNORMAL LOW (ref 3.87–5.11)
RDW: 15.6 % — ABNORMAL HIGH (ref 11.5–15.5)
WBC: 6.1 10*3/uL (ref 4.0–10.5)

## 2013-01-21 LAB — STOOL CULTURE

## 2013-01-21 MED ORDER — METOCLOPRAMIDE HCL 5 MG PO TABS: 5.0000 mg | ORAL_TABLET | Freq: Three times a day (TID) | ORAL | Status: DC

## 2013-01-21 NOTE — Plan of Care (Signed)
Problem: Phase III Progression Outcomes Goal: Pain controlled on oral analgesia Outcome: Completed/Met Date Met:  01/21/13 Denies pain at this time.

## 2013-01-21 NOTE — Progress Notes (Signed)
UR chart review completed.  

## 2013-01-21 NOTE — Discharge Summary (Signed)
Physician Discharge Summary  Kathleen [name]:[id number] DOB: July 27, 1961 DOA: 01/16/2013  PCP: [name], MD  Admit date: 01/16/2013 Discharge date: 01/21/2013  Time spent: Greater than 30 minutes  Recommendations for Outpatient Follow-up:  1. Followup with gastroenterology, Dr. Karilyn Cota in the next couple weeks.   Discharge Diagnoses:  1. Pseudomonas UTI, appropriately treated. 2. Abdominal bloating, unclear etiology. 3. Nausea vomiting and diarrhea, resolved. 4. Sarcoidosis,on steroids.   Discharge Condition: Stable.  Diet recommendation: Small frequent meals.  Filed Weights   01/16/13 2148  Weight: 91.6 kg (201 lb 15.1 oz)    History of present illness:  This 52 year old lady presented to the hospital with symptoms of nausea, vomiting, diarrhea and abdominal bloating for the last 2-3 weeks. Please see initial history as outlined below: HPI: Kathleen [name] is a 52 y.o. female with a past medical history of fibromyalgia, acid reflux disease, and sarcoidosis that was recently evaluated in [facility] in February of this year. She was started on Bactrim for 2 weeks, prednisone, as well as mycophenolate. She was also given methotrexate but she could not tolerate it due to hair loss. She was taken off of the mycophenolate about one week ago. She tells me that her nausea and vomiting, started about 2 weeks ago, followed by onset of diarrhea. She's been vomiting everything that she eats and bilious material. Denies any blood in the emesis. Has been having diarrhea about 5-6 times a day. Without any blood. It has been watery. No mucus. No abdominal pain or cramping. Denies any fever. Has had chills. No urinary complaints. She came in to the ED a few days ago, underwent blood workup, which was unremarkable. She was given IV fluids. Urine obtained from that visit grew Pseudomonas, more than 100,000 colonies. Ciprofloxacin was called a few days ago, however the patient has not taken yet. She's also  has the weakness and has lost about 15 pounds in the last 2 weeks. She's very anxious. She is accompanied by her husband and her daughter. She does admit to dizziness, but no syncopal episodes. Denies any shortness of breath, or chest pains.  Hospital Course:  The patient was admitted and started on intravenous antibiotics for a Pseudomonas UTI that was documented previously but antibiotics had not been started. She was also given intravenous fluids and her steroid was increased with intravenous hydrocortisone. She was seen by gastroenterology, Dr Karilyn Cota, who felt that the symptoms were multifactorial. She was started on intravenous Reglan which may have helped to some degree. She had a HIDA  scan done which was negative for gallbladder disease. During her hospitalization, she did improve and the diarrhea stopped. She was able to tolerate a diet without diarrhea but  still continued to have some bloating. This is a problem that probably she could managed  at home but will need close outpatient followup. She will followup with gastroenterology soon.  Procedures:  None.  Consultations:  Gastroenterology, Dr. Karilyn Cota  Discharge Exam: Filed Vitals:   01/20/13 0413 01/20/13 1424 01/20/13 2127 01/21/13 0524  BP: 110/63 111/68 112/80 100/49  Pulse: 54 79 59 60  Temp: 97.5 F (36.4 C) 97.9 F (36.6 C) 97.6 F (36.4 C) 97.3 F (36.3 C)  TempSrc: Oral  Oral Oral  Resp: 16 18 18 18   Height:      Weight:      SpO2: 96% 97% 96% 97%    General: She is systemically well. She is somewhat cushingoid. Cardiovascular: Heart sounds are present and normal without  murmurs or added sounds. Respiratory: Lung fields are clear. Abdomen is soft and nontender but is distended.  Discharge Instructions  Discharge Orders   Future Appointments Provider Department Dept Phone   01/21/2013 1:00 PM [facility] [place] [facility] [phone]   01/23/2013 1:00 PM [facility] [phone]   01/26/2013 1:00 PM [facility] [place] [facility] [phone]   01/28/2013 1:00 PM [facility] [place] [facility] [phone]   01/30/2013 1:00 PM [facility] [place] [facility] [phone]   02/02/2013 1:00 PM [facility] [place] [facility] [phone]   02/04/2013 1:00 PM [facility] [place] [facility] [phone]   02/06/2013 1:00 PM [facility] [place] [facility] [phone]   02/09/2013 1:00 PM [facility] [place] [facility] [phone]   02/11/2013 1:00 PM [facility] [place] [facility] [phone]   02/13/2013 1:00 PM [facility] [place] [facility] [phone]   02/16/2013 1:00 PM [facility] [place] [facility] [phone]   02/18/2013 1:00 PM [facility] [place] [facility] [phone]   02/20/2013 1:00 PM [facility] [place] [facility] [phone]   02/23/2013 1:00 PM [facility] [place] [facility] [phone]   02/25/2013 1:00 PM [facility] [place] [facility] [phone]   02/27/2013 1:00 PM [facility] [place] [facility] [phone]   03/02/2013 1:00 PM [facility] [place] [facility] [phone]   03/04/2013 1:00 PM [facility] [place] [facility] [phone]   03/06/2013 1:00 PM [facility] [place] [facility] [phone]   03/09/2013 1:00 PM [facility] [place] [facility] [phone]   03/11/2013 1:00 PM [facility] [place] [facility] [phone]   03/13/2013 1:00 PM [facility] [place] [facility] [phone]   03/16/2013 1:00 PM  [facility] [place] [facility] [phone]   03/18/2013 1:00 PM [facility] [place] [facility] [phone]   03/20/2013 1:00 PM [facility] [place] [facility] [phone]   03/23/2013 1:00 PM [facility] [place] [facility] [phone]   03/25/2013 1:00 PM [facility] [place] [facility] [phone]   03/27/2013 1:00 PM [facility]  [facility] [phone]   Future Orders Complete By Expires     Diet - low sodium heart healthy  As directed     Increase activity slowly  As directed         Medication List    TAKE these medications       ALPRAZolam 0.5 MG tablet  Commonly known as:  XANAX  Take 0.5 mg by mouth 3 (three) times daily as needed for sleep or anxiety. Take 1/2 to 1 tablet by mouth three times daily as needed for anxiety     ciprofloxacin 500 MG tablet  Commonly known as:  CIPRO  Take 500 mg by mouth 2 (two) times daily.     esomeprazole 40 MG capsule  Commonly known as:  NEXIUM  Take 40 mg by mouth daily before breakfast.     famotidine 20 MG tablet  Commonly known as:  PEPCID  Take 20 mg by mouth daily as needed for heartburn.     loperamide 2 MG capsule  Commonly known as:  IMODIUM  Take 2 mg by mouth 4 (four) times daily as needed  for diarrhea or loose stools.     metoCLOPramide 5 MG tablet  Commonly known as:  REGLAN  Take 1 tablet (5 mg total) by mouth 3 (three) times daily before meals.     ondansetron 8 MG tablet  Commonly known as:  ZOFRAN  Take 1 tablet (8 mg total) by mouth every 8 (eight) hours as needed for nausea.     predniSONE 10 MG tablet  Commonly known as:  DELTASONE  Take 10 mg by mouth 6 (six) times daily.     pregabalin 200 MG capsule  Commonly known as:  LYRICA  Take 200 mg by mouth daily.       Allergies  Allergen Reactions  . Bee Venom   . Benadryl (Diphenhydramine Hcl)      "makes my throat close up"  . Codeine Itching      The results of significant diagnostics from this hospitalization (including imaging, microbiology, ancillary and laboratory) are listed below for reference.    Significant Diagnostic Studies: Dg Abd 1 View  01/18/2013   *RADIOLOGY REPORT*  Clinical Data: Nausea, abdominal pain, abdominal distension.  ABDOMEN - 1 VIEW  Comparison: None.  Findings: No ileus or abnormal calcifications.  Soft tissue planes and bony structures are normal.  No dilatation of the bowel. Moderate stool in a nondistended colon.  IMPRESSION: No acute findings.  Moderate stool in the colon.   Original Report Authenticated By: Sander Radon, M.D.   Ct Abdomen Pelvis W Contrast  01/11/2013   *RADIOLOGY REPORT*  Clinical Data: Abdominal discomfort.  Nausea vomiting.  Diarrhea. Sarcoidosis.  CT ABDOMEN AND PELVIS WITH CONTRAST  Technique:  Multidetector CT imaging of the abdomen and pelvis was performed following the standard protocol during bolus administration of intravenous contrast.  Contrast: 100 ml Omnipaque-300 and oral contrast  Comparison: Abdomen MRI of 09/16/2012  Findings: A 1.2 cm low attenuation lesion in the inferior right hepatic lobe is stable compared with previous MRI. Other tiny central sub centimeter probable hepatic cysts are stable.  No new or enlarging liver lesions are seen.  Gallbladder is unremarkable. The pancreas, spleen, adrenal glands, and kidneys are normal appearance.  No evidence of hydronephrosis.  Uterus adnexal regions are unremarkable.  No evidence of lymphadenopathy.  No evidence of inflammatory process or abnormal fluid collections.  No evidence of bowel wall thickening, dilatation, or hernia.  IMPRESSION:  1.  No acute findings. 2.  Stable tiny liver lesions, likely benign.   Original Report Authenticated By: Myles Rosenthal, M.D.   Nm Hepato W/eject Fract  01/19/2013   *RADIOLOGY REPORT*  Clinical Data:  Nausea, vomiting and abdominal pain.   NUCLEAR MEDICINE HEPATOBILIARY IMAGING WITH GALLBLADDER EF  Technique:  Sequential images of the abdomen were obtained out to 60 minutes following intravenous administration of radiopharmaceutical.  After slow intravenous infusion of 1.82 micrograms Cholecystokinin, gallbladder ejection fraction was determined.  Radiopharmaceutical:  5.0 mCi Tc-26m Choletec  Comparison:  CT abdomen 01/11/2013.  Findings: There is symmetric uptake in the liver and prompt excretion into the biliary tree which is visualized by 10 minutes. The gallbladder is visualized at .  Activity is noted in the small bowel at 30 minutes.  The patient received a protocoled infusion of CCK and a gallbladder ejection fraction was estimated at 78.7%.  Normal is greater than 30%.  The patient did not experience symptoms during CCK infusion.  IMPRESSION:  Normal biliary patency study and normal gallbladder ejection fraction.   Original Report Authenticated By: Rudie Meyer, M.D.  Microbiology: Recent Results (from the past 240 hour(s))  URINE CULTURE     Status: None   Collection Time    01/11/13 10:30 PM      Result Value Range Status   Specimen Description URINE, CLEAN CATCH   Final   Special Requests NONE   Final   Culture  Setup Time 01/11/2013 23:00   Final   Colony Count >=100,000 COLONIES/ML   Final   Culture PSEUDOMONAS AERUGINOSA   Final   Report Status 01/13/2013 FINAL   Final   Organism ID, Bacteria PSEUDOMONAS AERUGINOSA   Final  CLOSTRIDIUM DIFFICILE BY PCR     Status: None   Collection Time    01/16/13 11:37 PM      Result Value Range Status   C difficile by pcr NEGATIVE  NEGATIVE Final  STOOL CULTURE     Status: None   Collection Time    01/16/13 11:37 PM      Result Value Range Status   Specimen Description STOOL   Final   Special Requests IMMUNE:COMPROMISED   Final   Culture     Final   Value: NO SALMONELLA, SHIGELLA, CAMPYLOBACTER, YERSINIA, OR E.COLI 0157:H7 ISOLATED     Note: REDUCED NORMAL  FLORA PRESENT   Report Status 01/21/2013 FINAL   Final     Labs: Basic Metabolic Panel:  Recent Labs Lab 01/17/13 0655 01/18/13 0805 01/19/13 0617 01/20/13 0627 01/21/13 0552  NA 138 143 140 139 141  K 3.6 3.4* 3.2* 3.8 3.2*  CL 107 112 107 106 104  CO2 24 28 27 28  34*  GLUCOSE 113* 90 113* 118* 107*  BUN 7 5* 11 12 10   CREATININE 0.57 0.60 0.63 0.59 0.64  CALCIUM 8.1* 7.9* 7.9* 8.0* 8.4   Liver Function Tests:  Recent Labs Lab 01/16/13 1833 01/17/13 0655 01/21/13 0552  AST 12 11 17   ALT 18 14 20   ALKPHOS 50 41 39  BILITOT 0.3 0.2* 0.1*  PROT 6.3 5.4* 5.1*  ALBUMIN 3.6 2.9* 2.6*     CBC:  Recent Labs Lab 01/16/13 1833 01/17/13 0655 01/19/13 0617 01/21/13 0552  WBC 7.7 5.2 5.3 6.1  HGB 12.6 10.7* 10.3* 10.9*  HCT 36.8 32.1* 30.5* 32.6*  MCV 84.0 84.9 85.7 85.1  PLT 275 252 211 254        Signed:  Senaya Dicenso C  [facility] 01/21/2013, 10:50 AM

## 2013-01-21 NOTE — Progress Notes (Signed)
Pt and her mother verbalize understanding of d/c instructions, medications, and follow up appts with Cardiac Rehab and Dr. Karilyn Cota. Pt has no questions at this time. IV d/c without complications by NT. Pt d/c via wheelchair, accompanied by NT and her mother. Sheryn Bison

## 2013-01-22 ENCOUNTER — Ambulatory Visit (INDEPENDENT_AMBULATORY_CARE_PROVIDER_SITE_OTHER): Payer: 59 | Admitting: Family Medicine

## 2013-01-22 ENCOUNTER — Telehealth: Payer: Self-pay | Admitting: Family Medicine

## 2013-01-22 ENCOUNTER — Encounter: Payer: Self-pay | Admitting: Family Medicine

## 2013-01-22 VITALS — BP 112/70 | Temp 97.7°F | Ht 70.0 in | Wt 220.2 lb

## 2013-01-22 DIAGNOSIS — D869 Sarcoidosis, unspecified: Secondary | ICD-10-CM

## 2013-01-22 DIAGNOSIS — R609 Edema, unspecified: Secondary | ICD-10-CM

## 2013-01-22 DIAGNOSIS — R600 Localized edema: Secondary | ICD-10-CM | POA: Insufficient documentation

## 2013-01-22 LAB — GASTRIN: Gastrin: 77 pg/mL (ref <=100)

## 2013-01-22 LAB — TISSUE TRANSGLUTAMINASE, IGA: Tissue Transglutaminase Ab, IgA: 2.8 U/mL (ref <20)

## 2013-01-22 LAB — GLIADIN ANTIBODIES, SERUM
Gliadin IgA: 28.8 U/mL — ABNORMAL HIGH (ref <20)
Gliadin IgG: 4.8 U/mL (ref <20)

## 2013-01-22 MED ORDER — TORSEMIDE 20 MG PO TABS: ORAL_TABLET | ORAL | Status: DC

## 2013-01-22 MED ORDER — POTASSIUM CHLORIDE ER 10 MEQ PO TBCR: 10.0000 meq | EXTENDED_RELEASE_TABLET | Freq: Two times a day (BID) | ORAL | Status: DC

## 2013-01-22 NOTE — Patient Instructions (Signed)
Stop Lyrica  Use torsemide 20 mg one in the am and one at lunch  Take potassium twice a day  Recheck here by [facility]

## 2013-01-22 NOTE — Progress Notes (Signed)
  Subjective:    Patient ID: Kathleen [name], female    DOB: 05-07-1961, 52 y.o.   MRN: [id number]  HPI Patient states that she is swelling up all over her body. She is concerned about her circulation being affected by the edema. The edema increased since yesterday.  She received large doses of steroids and iv fluids while in the hospital. Now with edema. Breathing stable. No PND or orthopnea PMH- sarcoid FMHx - negative  Review of Systems See above    Objective:   Physical Exam  Vitals reviewed. Constitutional: She appears well-developed.  HENT:  Head: Normocephalic.  Neck: Neck supple. No thyromegaly present.  Cardiovascular: Normal rate, regular rhythm and normal heart sounds.  Exam reveals no gallop.   No murmur heard. Pulmonary/Chest: Effort normal and breath sounds normal. No respiratory distress.  Musculoskeletal: She exhibits edema.  2 + edema in lower legs          Assessment & Plan:  Pedal edema- demadex 20 mg qam,qnoon and KCL 10 bid, recheck 5 days Warnings discussed

## 2013-01-22 NOTE — Telephone Encounter (Signed)
Pt to make app today to be seen in office

## 2013-01-22 NOTE — Telephone Encounter (Signed)
Patient states she is swelling all over her body to the point to where it is hard walk due to feet being so swollen.  States it is also making her breathing harder.  Patient is taking Prednisone 10mg  six times a day.  Patient would like to know if there is anything that can be done to treat the swelling?  Please call patient.  Thanks.

## 2013-01-23 ENCOUNTER — Encounter (HOSPITAL_COMMUNITY): Payer: 59

## 2013-01-23 ENCOUNTER — Encounter: Payer: Self-pay | Admitting: Family Medicine

## 2013-01-26 ENCOUNTER — Encounter (HOSPITAL_COMMUNITY): Payer: 59

## 2013-01-28 ENCOUNTER — Encounter (HOSPITAL_COMMUNITY)
Admission: RE | Admit: 2013-01-28 | Discharge: 2013-01-28 | Disposition: A | Discharge status: Home / Self Care | Payer: 59 | Source: Ambulatory Visit | Attending: Family Medicine | Admitting: Family Medicine

## 2013-01-28 ENCOUNTER — Encounter: Payer: Self-pay | Admitting: Family Medicine

## 2013-01-28 ENCOUNTER — Ambulatory Visit (INDEPENDENT_AMBULATORY_CARE_PROVIDER_SITE_OTHER): Payer: 59 | Admitting: Family Medicine

## 2013-01-28 VITALS — BP 104/70 | Temp 97.5°F | Ht 70.0 in | Wt 200.2 lb

## 2013-01-28 DIAGNOSIS — Z5189 Encounter for other specified aftercare: Secondary | ICD-10-CM | POA: Insufficient documentation

## 2013-01-28 DIAGNOSIS — R609 Edema, unspecified: Secondary | ICD-10-CM

## 2013-01-28 DIAGNOSIS — D869 Sarcoidosis, unspecified: Secondary | ICD-10-CM | POA: Insufficient documentation

## 2013-01-28 DIAGNOSIS — R600 Localized edema: Secondary | ICD-10-CM

## 2013-01-28 NOTE — Patient Instructions (Addendum)
toresemide one each am for Wed through Sat then just use as needed. Still  Take potassium with it one daily   Store brand Benifiber 1 to 2 tsp twice a day with liquid do this daily  Do blood work next week  Follow up here in 2 months  Use EGG timer when cooking to avoid forgetting that the stove is on.

## 2013-01-28 NOTE — Progress Notes (Signed)
  Subjective:    Patient ID: Kathleen [name], female    DOB: 05/29/1961, 52 y.o.   MRN: [id number]  HPI Patient is here today for a follow up visit on her pedal edema. Patient states that the edema is getting a lot better. Notes swelling has decreased a lot. Patient denies any wheezing shortness breath she is having swelling in her abdomen she relates is due to the prednisone plus also Dr. Dionicia Abler the gastroenterologist is working with her with her some sort possibility of an intestinal problem. In addition to this she also relates numbness in her hands and her feet that comes and goes causes her not to feel her feet and be able to use her hands with dexterity. Because of this she has been unable to go back to work as well as because of the severe sarcoidosis in her lungs. She is written out of work until least mid-July but there is a possibility she'll be out for several more months. In addition to this she states she has seen a neurologist and pulmonologist at [facility].  The diuretic is doing very well for her. She states that it has helped greatly with the swelling.  Review of Systems See above. She denies shortness of breath anymore than what it usually is. No fevers.    Objective:   Physical Exam Lungs are clear no crackles heart is regular pulses normal blood pressure is good abdomen obese extremities minimal edema       Assessment & Plan:  Pedal edema much better-decrease Demadex  20 mg one every morning for the next 4 days then when necessary. When she does state that she needs to take her potassium with it. Also metabolic 7 in approximately 7-14 days. Followup if progressive troubles otherwise see her back in 2 months time. Patient is currently disabled it is hard to know if she will be able to return to work. Her specialists are working with her in her handling this issue currently.

## 2013-01-29 ENCOUNTER — Ambulatory Visit (INDEPENDENT_AMBULATORY_CARE_PROVIDER_SITE_OTHER): Payer: 59 | Admitting: Internal Medicine

## 2013-01-29 NOTE — Progress Notes (Signed)
Apt has been scheduled for 03/03/13 at 10:15 am with Dorene Ar, NP.

## 2013-01-30 ENCOUNTER — Encounter (HOSPITAL_COMMUNITY): Payer: 59

## 2013-01-31 ENCOUNTER — Emergency Department (HOSPITAL_COMMUNITY)
Admission: EM | Admit: 2013-01-31 | Discharge: 2013-02-01 | Disposition: A | Discharge status: Home / Self Care | Payer: 59 | Attending: Emergency Medicine | Admitting: Emergency Medicine

## 2013-01-31 ENCOUNTER — Encounter (HOSPITAL_COMMUNITY): Payer: Self-pay | Admitting: *Deleted

## 2013-01-31 DIAGNOSIS — R21 Rash and other nonspecific skin eruption: Secondary | ICD-10-CM | POA: Insufficient documentation

## 2013-01-31 DIAGNOSIS — K219 Gastro-esophageal reflux disease without esophagitis: Secondary | ICD-10-CM | POA: Insufficient documentation

## 2013-01-31 DIAGNOSIS — Z8619 Personal history of other infectious and parasitic diseases: Secondary | ICD-10-CM | POA: Insufficient documentation

## 2013-01-31 DIAGNOSIS — Z8709 Personal history of other diseases of the respiratory system: Secondary | ICD-10-CM | POA: Insufficient documentation

## 2013-01-31 DIAGNOSIS — IMO0002 Reserved for concepts with insufficient information to code with codable children: Secondary | ICD-10-CM | POA: Insufficient documentation

## 2013-01-31 DIAGNOSIS — Z8742 Personal history of other diseases of the female genital tract: Secondary | ICD-10-CM | POA: Insufficient documentation

## 2013-01-31 DIAGNOSIS — Z79899 Other long term (current) drug therapy: Secondary | ICD-10-CM | POA: Insufficient documentation

## 2013-01-31 DIAGNOSIS — Z8679 Personal history of other diseases of the circulatory system: Secondary | ICD-10-CM | POA: Insufficient documentation

## 2013-01-31 DIAGNOSIS — Z87448 Personal history of other diseases of urinary system: Secondary | ICD-10-CM | POA: Insufficient documentation

## 2013-01-31 DIAGNOSIS — B029 Zoster without complications: Secondary | ICD-10-CM | POA: Insufficient documentation

## 2013-01-31 DIAGNOSIS — R300 Dysuria: Secondary | ICD-10-CM | POA: Insufficient documentation

## 2013-01-31 LAB — CBC WITH DIFFERENTIAL/PLATELET
Basophils Absolute: 0.1 10*3/uL (ref 0.0–0.1)
Basophils Relative: 1 % (ref 0–1)
Eosinophils Absolute: 0 10*3/uL (ref 0.0–0.7)
Eosinophils Relative: 0 % (ref 0–5)
HCT: 39.7 % (ref 36.0–46.0)
Hemoglobin: 13.3 g/dL (ref 12.0–15.0)
Lymphocytes Relative: 13 % (ref 12–46)
Lymphs Abs: 1.3 10*3/uL (ref 0.7–4.0)
MCH: 28.4 pg (ref 26.0–34.0)
MCHC: 33.5 g/dL (ref 30.0–36.0)
MCV: 84.8 fL (ref 78.0–100.0)
Monocytes Absolute: 0.8 10*3/uL (ref 0.1–1.0)
Monocytes Relative: 8 % (ref 3–12)
Neutro Abs: 7.7 10*3/uL (ref 1.7–7.7)
Neutrophils Relative %: 78 % — ABNORMAL HIGH (ref 43–77)
Platelets: 313 10*3/uL (ref 150–400)
RBC: 4.68 MIL/uL (ref 3.87–5.11)
RDW: 15.3 % (ref 11.5–15.5)
WBC: 9.9 10*3/uL (ref 4.0–10.5)

## 2013-01-31 LAB — URINALYSIS, ROUTINE W REFLEX MICROSCOPIC
Bilirubin Urine: NEGATIVE
Glucose, UA: NEGATIVE mg/dL
Hgb urine dipstick: NEGATIVE
Ketones, ur: NEGATIVE mg/dL
Leukocytes, UA: NEGATIVE
Nitrite: NEGATIVE
Protein, ur: NEGATIVE mg/dL
Specific Gravity, Urine: 1.01 (ref 1.005–1.030)
Urobilinogen, UA: 0.2 mg/dL (ref 0.0–1.0)
pH: 6.5 (ref 5.0–8.0)

## 2013-01-31 LAB — BASIC METABOLIC PANEL
BUN: 15 mg/dL (ref 6–23)
CO2: 28 mEq/L (ref 19–32)
Calcium: 9.7 mg/dL (ref 8.4–10.5)
Chloride: 90 mEq/L — ABNORMAL LOW (ref 96–112)
Creatinine, Ser: 0.98 mg/dL (ref 0.50–1.10)
GFR calc Af Amer: 77 mL/min — ABNORMAL LOW (ref >90)
GFR calc non Af Amer: 66 mL/min — ABNORMAL LOW (ref >90)
Glucose, Bld: 117 mg/dL — ABNORMAL HIGH (ref 70–99)
Potassium: 3.2 mEq/L — ABNORMAL LOW (ref 3.5–5.1)
Sodium: 131 mEq/L — ABNORMAL LOW (ref 135–145)

## 2013-01-31 MED ORDER — HYDROMORPHONE HCL PF 1 MG/ML IJ SOLN
1.0000 mg | Freq: Once | INTRAMUSCULAR | Status: AC
  Administered 2013-01-31: 1 mg via INTRAVENOUS
  Filled 2013-01-31: qty 1

## 2013-01-31 MED ORDER — HYDROMORPHONE HCL PF 1 MG/ML IJ SOLN
0.5000 mg | Freq: Once | INTRAMUSCULAR | Status: AC
  Administered 2013-01-31: 0.5 mg via INTRAVENOUS
  Filled 2013-01-31: qty 1

## 2013-01-31 MED ORDER — LORAZEPAM 2 MG/ML IJ SOLN
1.0000 mg | Freq: Once | INTRAMUSCULAR | Status: AC
  Administered 2013-01-31: 1 mg via INTRAVENOUS
  Filled 2013-01-31: qty 1

## 2013-01-31 MED ORDER — SODIUM CHLORIDE 0.9 % IV SOLN
Freq: Once | INTRAVENOUS | Status: AC
  Administered 2013-01-31: 125 mL/h via INTRAVENOUS

## 2013-01-31 MED ORDER — OXYCODONE-ACETAMINOPHEN 5-325 MG PO TABS: 1.0000 | ORAL_TABLET | Freq: Four times a day (QID) | ORAL | Status: DC | PRN

## 2013-01-31 MED ORDER — FAMCICLOVIR 500 MG PO TABS: 500.0000 mg | ORAL_TABLET | Freq: Three times a day (TID) | ORAL | Status: DC

## 2013-01-31 MED ORDER — VALACYCLOVIR HCL 500 MG PO TABS
1000.0000 mg | ORAL_TABLET | Freq: Every day | ORAL | Status: DC
  Administered 2013-01-31: 1000 mg via ORAL
  Filled 2013-01-31: qty 2

## 2013-01-31 NOTE — ED Notes (Signed)
Patient up to bathroom and cannot void.  Drinking water now and remaining fluids infusing.  Does not want to leave until she knows she can void.  Medicated for pain

## 2013-01-31 NOTE — ED Notes (Signed)
Difficulty urinating with retention x 1 wk.  Also states was admitted x 2 wks ago and has bedsores all over her body, c/o pain.

## 2013-01-31 NOTE — ED Provider Notes (Signed)
History    This chart was scribed for Kathleen Lennert, MD by Leone Payor, ED Scribe. This patient was seen in room APA12/AP[facility]12 and the patient's care was started 8:14 PM.   CSN: [id number]  Arrival date & time 01/31/13  1842   First MD Initiated Contact with Patient 01/31/13 2013      Chief Complaint  Patient presents with  . Urinary Retention     The history is provided by the patient. No language interpreter was used.    HPI Comments: Kathleen [name] is a 52 y.o. female who presents to the Emergency Department complaining of 1 week of ongoing, constant difficulty urinating with retention. States she was just able to void a few minutes ago. She was admitted 2 weeks ago and stayed in the hospital for 6 days. She now complains of sores to bilateral buttocks. States they burn and "feel like they are on fire". She denies any itching but states the sores are gradually spreading.    Past Medical History  Diagnosis Date  . Sarcoidosis   . Breast fibrocystic disorder   . Mitral valve prolapse   . Breast pain     right  . Normal stress echocardiogram Jan 2011  . Normal nuclear stress test December 2012  . Normal echocardiogram December 2012    EF 55 to 60%; mild MVP, grade 1 diastolic dysfunction, PAP 32  . Fibromyalgia   . DOE (dyspnea on exertion)   . Pulmonary fibrosis   . Gastroesophageal reflux disease   . Heart disease     Past Surgical History  Procedure Laterality Date  . Tubal ligation    . Breast lumpectomy  01/12/2011    right    Family History  Problem Relation Age of Onset  . Cardiomyopathy Mother   . Breast cancer Mother   . Heart disease Maternal Aunt   . Prostate cancer Father   . Colon cancer Neg Hx   . Neurofibromatosis Brother     History  Substance Use Topics  . Smoking status: Never Smoker   . Smokeless tobacco: Never Used  . Alcohol Use: No    OB History   Grav Para Term Preterm Abortions TAB SAB Ect Mult Living                  Review  of Systems  Constitutional: Negative for appetite change and fatigue.  HENT: Negative for congestion, sinus pressure and ear discharge.   Eyes: Negative for discharge.  Respiratory: Negative for cough.   Cardiovascular: Negative for chest pain.  Gastrointestinal: Negative for abdominal pain and diarrhea.  Genitourinary: Positive for difficulty urinating. Negative for frequency and hematuria.  Musculoskeletal: Negative for back pain.  Skin: Positive for rash.  Neurological: Negative for seizures and headaches.  Psychiatric/Behavioral: Negative for hallucinations.    Allergies  Bee venom; Benadryl; and Codeine  Home Medications   Current Outpatient Rx  Name  Route  Sig  Dispense  Refill  . ALPRAZolam (XANAX) 0.5 MG tablet   Oral   Take 0.5 mg by mouth at bedtime.          Marland Kitchen azaTHIOprine (IMURAN) 50 MG tablet   Oral   Take 100 mg by mouth daily.          Marland Kitchen esomeprazole (NEXIUM) 40 MG capsule   Oral   Take 40 mg by mouth daily before breakfast.         . potassium chloride (K-DUR) 10 MEQ tablet  Oral   Take 1 tablet (10 mEq total) by mouth 2 (two) times daily.   60 tablet   4   . predniSONE (DELTASONE) 10 MG tablet   Oral   Take 30 mg by mouth daily.         Marland Kitchen torsemide (DEMADEX) 20 MG tablet   Oral   Take 20 mg by mouth 2 (two) times daily.         . pregabalin (LYRICA) 200 MG capsule   Oral   Take 200 mg by mouth daily.           BP 124/52  Pulse 101  Temp(Src) 96.5 F (35.8 C) (Oral)  Resp 23  Ht 5\' 10"  (1.778 m)  Wt 200 lb (90.719 kg)  BMI 28.7 kg/m2  SpO2 95%  LMP 01/17/2013  Physical Exam  Nursing note and vitals reviewed. Constitutional: She is oriented to person, place, and time. She appears well-developed.  HENT:  Head: Normocephalic.  Eyes: Conjunctivae and EOM are normal. No scleral icterus.  Neck: Neck supple. No thyromegaly present.  Cardiovascular: Normal rate, regular rhythm and normal heart sounds.  Exam reveals no gallop  and no friction rub.   No murmur heard. Pulmonary/Chest: Breath sounds normal. No stridor. She has no wheezes. She has no rales. She exhibits no tenderness.  Abdominal: She exhibits no distension. There is no tenderness. There is no rebound.  Genitourinary:  Rash to both sides of buttocks with vesicles. Area is tender to palpation.    Musculoskeletal: Normal range of motion. She exhibits no edema.  Lymphadenopathy:    She has no cervical adenopathy.  Neurological: She is oriented to person, place, and time. Coordination normal.  Skin: No rash noted. No erythema.  Psychiatric: She has a normal mood and affect. Her behavior is normal.    ED Course  Procedures (including critical care time)  DIAGNOSTIC STUDIES: Oxygen Saturation is 95% on room air, adequate by my interpretation.    COORDINATION OF CARE: 8:19 PM Discussed treatment plan with pt at bedside and pt agreed to plan.   Labs Reviewed  CBC WITH DIFFERENTIAL - Abnormal; Notable for the following:    Neutrophils Relative % 78 (*)    All other components within normal limits  BASIC METABOLIC PANEL - Abnormal; Notable for the following:    Sodium 131 (*)    Potassium 3.2 (*)    Chloride 90 (*)    Glucose, Bld 117 (*)    GFR calc non Af Amer 66 (*)    GFR calc Af Amer 77 (*)    All other components within normal limits  URINALYSIS, ROUTINE W REFLEX MICROSCOPIC   No results found.   No diagnosis found.    MDM  shingles  The chart was scribed for me under my direct supervision.  I personally performed the history, physical, and medical decision making and all procedures in the evaluation of this patient.Kathleen Lennert, MD 01/31/13 [phone]

## 2013-02-01 ENCOUNTER — Encounter (HOSPITAL_COMMUNITY): Payer: Self-pay | Admitting: *Deleted

## 2013-02-01 ENCOUNTER — Emergency Department (HOSPITAL_COMMUNITY)
Admission: EM | Admit: 2013-02-01 | Discharge: 2013-02-01 | Disposition: A | Discharge status: Home / Self Care | Payer: 59 | Attending: Emergency Medicine | Admitting: Emergency Medicine

## 2013-02-01 DIAGNOSIS — R300 Dysuria: Secondary | ICD-10-CM | POA: Insufficient documentation

## 2013-02-01 DIAGNOSIS — Z8742 Personal history of other diseases of the female genital tract: Secondary | ICD-10-CM | POA: Insufficient documentation

## 2013-02-01 DIAGNOSIS — IMO0001 Reserved for inherently not codable concepts without codable children: Secondary | ICD-10-CM | POA: Insufficient documentation

## 2013-02-01 DIAGNOSIS — R109 Unspecified abdominal pain: Secondary | ICD-10-CM | POA: Insufficient documentation

## 2013-02-01 DIAGNOSIS — Z8679 Personal history of other diseases of the circulatory system: Secondary | ICD-10-CM | POA: Insufficient documentation

## 2013-02-01 DIAGNOSIS — K219 Gastro-esophageal reflux disease without esophagitis: Secondary | ICD-10-CM | POA: Insufficient documentation

## 2013-02-01 DIAGNOSIS — R110 Nausea: Secondary | ICD-10-CM | POA: Insufficient documentation

## 2013-02-01 DIAGNOSIS — R34 Anuria and oliguria: Secondary | ICD-10-CM | POA: Insufficient documentation

## 2013-02-01 DIAGNOSIS — Z8619 Personal history of other infectious and parasitic diseases: Secondary | ICD-10-CM | POA: Insufficient documentation

## 2013-02-01 DIAGNOSIS — Z9851 Tubal ligation status: Secondary | ICD-10-CM | POA: Insufficient documentation

## 2013-02-01 DIAGNOSIS — B029 Zoster without complications: Secondary | ICD-10-CM | POA: Insufficient documentation

## 2013-02-01 DIAGNOSIS — Z79899 Other long term (current) drug therapy: Secondary | ICD-10-CM | POA: Insufficient documentation

## 2013-02-01 DIAGNOSIS — R339 Retention of urine, unspecified: Secondary | ICD-10-CM | POA: Insufficient documentation

## 2013-02-01 DIAGNOSIS — Z8709 Personal history of other diseases of the respiratory system: Secondary | ICD-10-CM | POA: Insufficient documentation

## 2013-02-01 LAB — URINALYSIS, ROUTINE W REFLEX MICROSCOPIC
Bilirubin Urine: NEGATIVE
Glucose, UA: NEGATIVE mg/dL
Ketones, ur: NEGATIVE mg/dL
Leukocytes, UA: NEGATIVE
Nitrite: NEGATIVE
Protein, ur: NEGATIVE mg/dL
Specific Gravity, Urine: 1.02 (ref 1.005–1.030)
Urobilinogen, UA: 0.2 mg/dL (ref 0.0–1.0)
pH: 6 (ref 5.0–8.0)

## 2013-02-01 LAB — URINE MICROSCOPIC-ADD ON

## 2013-02-01 MED ORDER — HYDROMORPHONE HCL PF 1 MG/ML IJ SOLN
1.0000 mg | Freq: Once | INTRAMUSCULAR | Status: AC
  Administered 2013-02-01: 1 mg via INTRAVENOUS
  Filled 2013-02-01: qty 1

## 2013-02-01 MED ORDER — ONDANSETRON HCL 4 MG/2ML IJ SOLN
4.0000 mg | Freq: Once | INTRAMUSCULAR | Status: AC
  Administered 2013-02-01: 4 mg via INTRAVENOUS
  Filled 2013-02-01: qty 2

## 2013-02-01 MED ORDER — HYDROMORPHONE HCL PF 1 MG/ML IJ SOLN
INTRAMUSCULAR | Status: AC
  Administered 2013-02-01: 1 mg via INTRAVENOUS
  Filled 2013-02-01: qty 1

## 2013-02-01 MED ORDER — HYDROMORPHONE HCL PF 1 MG/ML IJ SOLN: 1.0000 mg | Freq: Once | INTRAMUSCULAR | Status: AC

## 2013-02-01 MED ORDER — SODIUM CHLORIDE 0.9 % IV BOLUS (SEPSIS)
250.0000 mL | Freq: Once | INTRAVENOUS | Status: AC
  Administered 2013-02-01: 250 mL via INTRAVENOUS

## 2013-02-01 MED ORDER — ONDANSETRON HCL 4 MG/2ML IJ SOLN: 4.0000 mg | Freq: Once | INTRAMUSCULAR | Status: AC

## 2013-02-01 MED ORDER — ONDANSETRON HCL 4 MG/2ML IJ SOLN
INTRAMUSCULAR | Status: AC
  Administered 2013-02-01: 4 mg via INTRAVENOUS
  Filled 2013-02-01: qty 2

## 2013-02-01 MED ORDER — SODIUM CHLORIDE 0.9 % IV SOLN: INTRAVENOUS | Status: DC

## 2013-02-01 NOTE — ED Notes (Signed)
Pt given leg bag, and explained to pt how to use the leg bag properly, and how to switch leg bag to foley bag. Pt states she understood the directions.

## 2013-02-01 NOTE — ED Notes (Signed)
Pt presents to er this am with c/o not being able to urinate and rash to vaginal and buttock area, was seen in er last night and diagnosed with shingles. Pt states that she is having trouble urinating for the past week, lower abd pressure.

## 2013-02-01 NOTE — ED Provider Notes (Signed)
History  This chart was scribed for Kathleen Jakes, MD by Greggory Stallion, ED Scribe. This patient was seen in room APA11/APA11 and the patient's care was started at 8:32 AM.  CSN: [id number]  Arrival date & time 02/01/13  4098   Chief Complaint  Patient presents with  . Urinary Retention  . Abdominal Pain    The history is provided by the patient. No language interpreter was used.    HPI Comments: Kathleen [name] is a 52 y.o. female who presents to the Emergency Department complaining of urinary retention with associated lower abdominal pain that started Thursday. Pt states she has a rash to vaginal and buttock area. She states she was seen here last night and diagnosed with shingles. Pt states the last time she urinated was "a drop here and there" before she came to the ED this morning but the last time she urinated well was last night while she was in the ED. She states the pain from her shingles is 10/10. Pt denies fever, neck pain, sore throat, visual disturbance, CP, cough, SOB, nausea, emesis, diarrhea, back pain, HA, weakness, numbness and rash as associated symptoms. Pt states she would like pain medication.   PCP is Dr. [name]  Past Medical History  Diagnosis Date  . Sarcoidosis   . Breast fibrocystic disorder   . Mitral valve prolapse   . Breast pain     right  . Normal stress echocardiogram Jan 2011  . Normal nuclear stress test December 2012  . Normal echocardiogram December 2012    EF 55 to 60%; mild MVP, grade 1 diastolic dysfunction, PAP 32  . Fibromyalgia   . DOE (dyspnea on exertion)   . Pulmonary fibrosis   . Gastroesophageal reflux disease   . Heart disease     Past Surgical History  Procedure Laterality Date  . Tubal ligation    . Breast lumpectomy  01/12/2011    right    Family History  Problem Relation Age of Onset  . Cardiomyopathy Mother   . Breast cancer Mother   . Heart disease Maternal Aunt   . Prostate cancer Father   . Colon  cancer Neg Hx   . Neurofibromatosis Brother     History  Substance Use Topics  . Smoking status: Never Smoker   . Smokeless tobacco: Never Used  . Alcohol Use: No    OB History   Grav Para Term Preterm Abortions TAB SAB Ect Mult Living                  Review of Systems  Constitutional: Negative for fever and chills.  HENT: Negative for congestion, sore throat, rhinorrhea and sneezing.   Eyes: Negative for visual disturbance.  Respiratory: Negative for cough.   Cardiovascular: Negative for leg swelling.  Gastrointestinal: Positive for nausea. Negative for vomiting and diarrhea.  Genitourinary: Positive for flank pain, decreased urine volume and difficulty urinating. Negative for dysuria.  Musculoskeletal: Positive for back pain.  Skin: Positive for rash.  Neurological: Negative for headaches.  Hematological: Does not bruise/bleed easily.  Psychiatric/Behavioral: Negative for confusion.  All other systems reviewed and are negative.    Allergies  Bee venom; Benadryl; and Codeine  Home Medications   Current Outpatient Rx  Name  Route  Sig  Dispense  Refill  . ALPRAZolam (XANAX) 0.5 MG tablet   Oral   Take 0.5 mg by mouth at bedtime.          Marland Kitchen azaTHIOprine (IMURAN)  50 MG tablet   Oral   Take 100 mg by mouth daily.          Marland Kitchen esomeprazole (NEXIUM) 40 MG capsule   Oral   Take 40 mg by mouth daily before breakfast.         . famciclovir (FAMVIR) 500 MG tablet   Oral   Take 1 tablet (500 mg total) by mouth 3 (three) times daily.   30 tablet   0   . oxyCODONE-acetaminophen (PERCOCET/ROXICET) 5-325 MG per tablet   Oral   Take 1 tablet by mouth every 6 (six) hours as needed for pain.   40 tablet   0   . potassium chloride (K-DUR) 10 MEQ tablet   Oral   Take 1 tablet (10 mEq total) by mouth 2 (two) times daily.   60 tablet   4   . predniSONE (DELTASONE) 10 MG tablet   Oral   Take 30 mg by mouth daily.         . pregabalin (LYRICA) 200 MG  capsule   Oral   Take 200 mg by mouth daily.         Marland Kitchen torsemide (DEMADEX) 20 MG tablet   Oral   Take 20 mg by mouth 2 (two) times daily.           BP 116/59  Pulse 92  Temp(Src) 98.2 F (36.8 C) (Oral)  Resp 20  SpO2 96%  LMP 01/17/2013  Physical Exam  Nursing note and vitals reviewed. Constitutional: She is oriented to person, place, and time. She appears well-developed and well-nourished.  HENT:  Head: Normocephalic and atraumatic.  Eyes: Pupils are equal, round, and reactive to light.  Neck: Normal range of motion.  Cardiovascular: Normal rate and regular rhythm.   No murmur heard. Pulmonary/Chest: Effort normal and breath sounds normal. She has no wheezes.  Abdominal: Soft. Bowel sounds are normal. There is no tenderness.  Musculoskeletal: Normal range of motion.  No leg swelling.  Neurological: She is alert and oriented to person, place, and time. No cranial nerve deficit. She exhibits normal muscle tone. Coordination normal.  Skin: Skin is warm and dry. Rash noted.  Large rash 7 cm on left side of buttocks, down in sacral area, does not cross midline. Erythema and vesicles. No rash on abdomen.     ED Course  Procedures (including critical care time)  DIAGNOSTIC STUDIES: Oxygen Saturation is 96% on RA, normal by my interpretation.    COORDINATION OF CARE: 8:59 AM-Discussed treatment plan with pt at bedside and pt agreed to plan.   Results for orders placed during the hospital encounter of 02/01/13  URINALYSIS, ROUTINE W REFLEX MICROSCOPIC      Result Value Range   Color, Urine YELLOW  YELLOW   APPearance CLEAR  CLEAR   Specific Gravity, Urine 1.020  1.005 - 1.030   pH 6.0  5.0 - 8.0   Glucose, UA NEGATIVE  NEGATIVE mg/dL   Hgb urine dipstick TRACE (*) NEGATIVE   Bilirubin Urine NEGATIVE  NEGATIVE   Ketones, ur NEGATIVE  NEGATIVE mg/dL   Protein, ur NEGATIVE  NEGATIVE mg/dL   Urobilinogen, UA 0.2  0.0 - 1.0 mg/dL   Nitrite NEGATIVE  NEGATIVE    Leukocytes, UA NEGATIVE  NEGATIVE  URINE MICROSCOPIC-ADD ON      Result Value Range   Squamous Epithelial / LPF RARE  RARE   RBC / HPF 0-2  <3 RBC/hpf   Dg Abd 1 View  01/18/2013   *  RADIOLOGY REPORT*  Clinical Data: Nausea, abdominal pain, abdominal distension.  ABDOMEN - 1 VIEW  Comparison: None.  Findings: No ileus or abnormal calcifications.  Soft tissue planes and bony structures are normal.  No dilatation of the bowel. Moderate stool in a nondistended colon.  IMPRESSION: No acute findings.  Moderate stool in the colon.   Original Report Authenticated By: Sander Radon, M.D.   Ct Abdomen Pelvis W Contrast  01/11/2013   *RADIOLOGY REPORT*  Clinical Data: Abdominal discomfort.  Nausea vomiting.  Diarrhea. Sarcoidosis.  CT ABDOMEN AND PELVIS WITH CONTRAST  Technique:  Multidetector CT imaging of the abdomen and pelvis was performed following the standard protocol during bolus administration of intravenous contrast.  Contrast: 100 ml Omnipaque-300 and oral contrast  Comparison: Abdomen MRI of 09/16/2012  Findings: A 1.2 cm low attenuation lesion in the inferior right hepatic lobe is stable compared with previous MRI. Other tiny central sub centimeter probable hepatic cysts are stable.  No new or enlarging liver lesions are seen.  Gallbladder is unremarkable. The pancreas, spleen, adrenal glands, and kidneys are normal appearance.  No evidence of hydronephrosis.  Uterus adnexal regions are unremarkable.  No evidence of lymphadenopathy.  No evidence of inflammatory process or abnormal fluid collections.  No evidence of bowel wall thickening, dilatation, or hernia.  IMPRESSION:  1.  No acute findings. 2.  Stable tiny liver lesions, likely benign.   Original Report Authenticated By: Myles Rosenthal, M.D.   Nm Hepato W/eject Fract  01/19/2013   *RADIOLOGY REPORT*  Clinical Data:  Nausea, vomiting and abdominal pain.  NUCLEAR MEDICINE HEPATOBILIARY IMAGING WITH GALLBLADDER EF  Technique:  Sequential images of  the abdomen were obtained out to 60 minutes following intravenous administration of radiopharmaceutical.  After slow intravenous infusion of 1.82 micrograms Cholecystokinin, gallbladder ejection fraction was determined.  Radiopharmaceutical:  5.0 mCi Tc-77m Choletec  Comparison:  CT abdomen 01/11/2013.  Findings: There is symmetric uptake in the liver and prompt excretion into the biliary tree which is visualized by 10 minutes. The gallbladder is visualized at .  Activity is noted in the small bowel at 30 minutes.  The patient received a protocoled infusion of CCK and a gallbladder ejection fraction was estimated at 78.7%.  Normal is greater than 30%.  The patient did not experience symptoms during CCK infusion.  IMPRESSION:  Normal biliary patency study and normal gallbladder ejection fraction.   Original Report Authenticated By: Rudie Meyer, M.D.    Medications  0.9 %  sodium chloride infusion ( Intravenous Rate/Dose Change 02/01/13 0958)  sodium chloride 0.9 % bolus 250 mL (0 mLs Intravenous Stopped 02/01/13 0958)  ondansetron (ZOFRAN) injection 4 mg (4 mg Intravenous Given 02/01/13 0943)  HYDROmorphone (DILAUDID) injection 1 mg (1 mg Intravenous Given 02/01/13 0943)  ondansetron (ZOFRAN) injection 4 mg (4 mg Intravenous Given 02/01/13 1051)  HYDROmorphone (DILAUDID) injection 1 mg (1 mg Intravenous Given 02/01/13 1051)      MDM   Patient the with a sacral shingles. Could be playing some role with the difficulty in voiding due to the pain. However placement of Foley catheter here got about 1200 cc of urine out. Obvious urinary retention and probably overstretching of the bladder. Patient will be converted over to a leg bag that can be removed in 2 days. Patient was here last evening and was prescribed antiviral medicine for the shingles and does have pain medicine that she can fill. Patient's primary care Dr. she can followup with them to have a leg bag removed  or to return to the ED.  A  urinalysis negative for urinary tract infection.     I personally performed the services described in this documentation, which was scribed in my presence. The recorded information has been reviewed and is accurate.    Kathleen Jakes, MD 02/01/13 1245

## 2013-02-02 ENCOUNTER — Encounter (HOSPITAL_COMMUNITY): Payer: 59

## 2013-02-02 ENCOUNTER — Encounter: Payer: Self-pay | Admitting: Family Medicine

## 2013-02-02 ENCOUNTER — Ambulatory Visit (INDEPENDENT_AMBULATORY_CARE_PROVIDER_SITE_OTHER): Payer: 59 | Admitting: Family Medicine

## 2013-02-02 VITALS — BP 112/77 | Temp 98.4°F | Wt 196.4 lb

## 2013-02-02 DIAGNOSIS — R339 Retention of urine, unspecified: Secondary | ICD-10-CM | POA: Insufficient documentation

## 2013-02-02 DIAGNOSIS — B029 Zoster without complications: Secondary | ICD-10-CM | POA: Insufficient documentation

## 2013-02-02 MED ORDER — OXYCODONE-ACETAMINOPHEN 10-325 MG PO TABS: 1.0000 | ORAL_TABLET | ORAL | Status: DC | PRN

## 2013-02-02 NOTE — Progress Notes (Signed)
  Subjective:    Patient ID: Kathleen [name], female    DOB: 03-16-1961, 52 y.o.   MRN: [id number]  HPI Patient with significant case of shingles seen in the ER also had urinary retention related to the shingles. Actually doing a fair amount better compared where she was. He still having significant pain Percocet 5 mg not doing the job. She has a urinary cath bag on the left leg no fevers. Past medical history she is on Imuran as well as prednisone for sarcoid. Family history noncontributory social noncontributory   Review of Systems    See above Objective:   Physical Exam  Lungs are clear hearts regular significant blistering noted associated with the shingles.      Assessment & Plan:  Shingles-continue Famvir as prescribed use Percocet 10 mg/325 mg one every 4 hours when necessary severe pain cautioned drowsiness, followup within one week's time. Will discuss case with infectious disease to see if other measures are necessary.

## 2013-02-04 ENCOUNTER — Encounter (HOSPITAL_COMMUNITY): Payer: 59

## 2013-02-04 ENCOUNTER — Ambulatory Visit (INDEPENDENT_AMBULATORY_CARE_PROVIDER_SITE_OTHER): Payer: 59 | Admitting: Family Medicine

## 2013-02-04 ENCOUNTER — Encounter: Payer: Self-pay | Admitting: Family Medicine

## 2013-02-04 DIAGNOSIS — B029 Zoster without complications: Secondary | ICD-10-CM

## 2013-02-04 DIAGNOSIS — R339 Retention of urine, unspecified: Secondary | ICD-10-CM

## 2013-02-04 NOTE — Progress Notes (Signed)
  Subjective:    Patient ID: Kathleen [name], female    DOB: Feb 19, 1961, 52 y.o.   MRN: [id number]  HPI Patient overall is doing pretty good she is still having urinary retention has a catheter in shingles actually doing much better the pain is doing much better inflammation doing better we will go ahead with removing the catheter today she states pain levels she takes 2 at a time when she gets the new prescription filled showing do one at times  History of shingles on prednisone and Imuran has history of sarcoid Family history noncontributory   Review of Systems See above    Objective:   Physical Exam Lungs clear hearts regular shingles is starting to heal up some erythema around it concerning for the possibility of secondary infection but does not appear severe  Catheter was removed by the nurse without difficulty       Assessment & Plan:  Urinary retention hopefully resolved patient was told if she starts having problems notify us or go to the ER Shingles improving continue Sam Bier possibly will have to extend if still persistent into next week she will call us on Monday Bactrim DS twice a day for 5 days then resume one daily.

## 2013-02-05 ENCOUNTER — Telehealth: Payer: Self-pay | Admitting: *Deleted

## 2013-02-05 NOTE — Telephone Encounter (Signed)
[facility] 02/05/13 1545 "no voice mail" set up

## 2013-02-06 ENCOUNTER — Encounter (HOSPITAL_COMMUNITY): Payer: 59

## 2013-02-09 ENCOUNTER — Encounter (HOSPITAL_COMMUNITY): Payer: Self-pay | Admitting: Emergency Medicine

## 2013-02-09 ENCOUNTER — Encounter (HOSPITAL_COMMUNITY): Payer: 59

## 2013-02-09 DIAGNOSIS — Z3202 Encounter for pregnancy test, result negative: Secondary | ICD-10-CM | POA: Insufficient documentation

## 2013-02-09 DIAGNOSIS — Z8709 Personal history of other diseases of the respiratory system: Secondary | ICD-10-CM | POA: Insufficient documentation

## 2013-02-09 DIAGNOSIS — Z859 Personal history of malignant neoplasm, unspecified: Secondary | ICD-10-CM | POA: Insufficient documentation

## 2013-02-09 DIAGNOSIS — B029 Zoster without complications: Secondary | ICD-10-CM | POA: Insufficient documentation

## 2013-02-09 DIAGNOSIS — K219 Gastro-esophageal reflux disease without esophagitis: Secondary | ICD-10-CM | POA: Insufficient documentation

## 2013-02-09 DIAGNOSIS — Z79899 Other long term (current) drug therapy: Secondary | ICD-10-CM | POA: Insufficient documentation

## 2013-02-09 DIAGNOSIS — Z8679 Personal history of other diseases of the circulatory system: Secondary | ICD-10-CM | POA: Insufficient documentation

## 2013-02-09 DIAGNOSIS — IMO0001 Reserved for inherently not codable concepts without codable children: Secondary | ICD-10-CM | POA: Insufficient documentation

## 2013-02-09 DIAGNOSIS — R3915 Urgency of urination: Secondary | ICD-10-CM | POA: Insufficient documentation

## 2013-02-09 LAB — URINE MICROSCOPIC-ADD ON

## 2013-02-09 LAB — URINALYSIS, ROUTINE W REFLEX MICROSCOPIC
Bilirubin Urine: NEGATIVE
Glucose, UA: NEGATIVE mg/dL
Ketones, ur: NEGATIVE mg/dL
Nitrite: NEGATIVE
Protein, ur: NEGATIVE mg/dL
Specific Gravity, Urine: 1.006 (ref 1.005–1.030)
Urobilinogen, UA: 0.2 mg/dL (ref 0.0–1.0)
pH: 6.5 (ref 5.0–8.0)

## 2013-02-09 LAB — POCT PREGNANCY, URINE: Preg Test, Ur: NEGATIVE

## 2013-02-09 NOTE — ED Notes (Signed)
PT. REPORTS URINARY RETENTION ONSET TODAY WITH BLADDER PRESSURE , DENIES HEMATURIA OR DYSURIA , SEEN AT [facility] LAST WEEK FOLEY CATHETER INSERTED BUT WAS REMOVED AFTER 2 DAYS .

## 2013-02-10 ENCOUNTER — Emergency Department (HOSPITAL_COMMUNITY)
Admission: EM | Admit: 2013-02-10 | Discharge: 2013-02-10 | Disposition: A | Discharge status: Home / Self Care | Payer: 59 | Attending: Emergency Medicine | Admitting: Emergency Medicine

## 2013-02-10 DIAGNOSIS — R3915 Urgency of urination: Secondary | ICD-10-CM

## 2013-02-10 DIAGNOSIS — B029 Zoster without complications: Secondary | ICD-10-CM

## 2013-02-10 LAB — POCT I-STAT, CHEM 8
BUN: 16 mg/dL (ref 6–23)
Calcium, Ion: 1.17 mmol/L (ref 1.12–1.23)
Chloride: 105 mEq/L (ref 96–112)
Creatinine, Ser: 0.8 mg/dL (ref 0.50–1.10)
Glucose, Bld: 88 mg/dL (ref 70–99)
HCT: 37 % (ref 36.0–46.0)
Hemoglobin: 12.6 g/dL (ref 12.0–15.0)
Potassium: 3.8 mEq/L (ref 3.5–5.1)
Sodium: 139 mEq/L (ref 135–145)
TCO2: 25 mmol/L (ref 0–100)

## 2013-02-10 MED ORDER — HYDROMORPHONE HCL PF 1 MG/ML IJ SOLN
1.0000 mg | Freq: Once | INTRAMUSCULAR | Status: AC
  Administered 2013-02-10: 1 mg via INTRAVENOUS
  Filled 2013-02-10: qty 1

## 2013-02-10 MED ORDER — ONDANSETRON HCL 4 MG/2ML IJ SOLN
4.0000 mg | Freq: Once | INTRAMUSCULAR | Status: AC
  Administered 2013-02-10: 4 mg via INTRAVENOUS
  Filled 2013-02-10: qty 2

## 2013-02-10 MED ORDER — OXYCODONE-ACETAMINOPHEN 5-325 MG PO TABS: 2.0000 | ORAL_TABLET | ORAL | Status: DC | PRN

## 2013-02-10 NOTE — ED Notes (Addendum)
Pt states that she was hospitalized for kidney infection 2 weeks ago then shingles last week and then because of medication and shingles Pt couldn't void and foley was placed and kept for 3 days and removed 6 days ago Wednesday.

## 2013-02-10 NOTE — ED Notes (Signed)
Pt given discharge paperwork and education. Pt cautioned not to drive until after 0454 or when duilauded wears off. Pt states that her husband and daughter will be off soon and could pick her up.

## 2013-02-10 NOTE — ED Provider Notes (Signed)
History     CSN: [id number]  Arrival date & time 02/09/13  2158   First MD Initiated Contact with Patient 02/10/13 [phone]      Chief Complaint  Patient presents with  . Urinary Retention    (Consider location/radiation/quality/duration/timing/severity/associated sxs/prior treatment) HPI History provided by patient. Last week diagnosed with shingles, complicated by urinary retention requiring Foley catheter. Her primary care physician remove the Foley a few days ago and she presents tonight with persistent shingles pain and urinary urgency and frequency - patient is very worried that she has urinary retention again. Pain is sharp and severe located right sacral and rectal area. No difficulty with bowel movements. No fevers or chills. No nausea vomiting or diarrhea.  Past Medical History  Diagnosis Date  . Sarcoidosis   . Breast fibrocystic disorder   . Mitral valve prolapse   . Breast pain     right  . Normal stress echocardiogram Jan 2011  . Normal nuclear stress test December 2012  . Normal echocardiogram December 2012    EF 55 to 60%; mild MVP, grade 1 diastolic dysfunction, PAP 32  . Fibromyalgia   . DOE (dyspnea on exertion)   . Pulmonary fibrosis   . Gastroesophageal reflux disease   . Heart disease     Past Surgical History  Procedure Laterality Date  . Tubal ligation    . Breast lumpectomy  01/12/2011    right    Family History  Problem Relation Age of Onset  . Cardiomyopathy Mother   . Breast cancer Mother   . Heart disease Maternal Aunt   . Prostate cancer Father   . Colon cancer Neg Hx   . Neurofibromatosis Brother     History  Substance Use Topics  . Smoking status: Never Smoker   . Smokeless tobacco: Never Used  . Alcohol Use: No    OB History   Grav Para Term Preterm Abortions TAB SAB Ect Mult Living                  Review of Systems  Constitutional: Negative for fever and chills.  HENT: Negative for neck pain and neck stiffness.   Eyes:  Negative for pain.  Respiratory: Negative for shortness of breath.   Cardiovascular: Negative for chest pain.  Gastrointestinal: Positive for rectal pain. Negative for abdominal pain.  Genitourinary: Positive for urgency. Negative for dysuria.  Musculoskeletal: Negative for back pain.  Skin: Negative for rash.  Neurological: Negative for headaches.  All other systems reviewed and are negative.    Allergies  Bee venom; Benadryl; and Codeine  Home Medications   Current Outpatient Rx  Name  Route  Sig  Dispense  Refill  . ALPRAZolam (XANAX) 0.5 MG tablet   Oral   Take 0.5 mg by mouth at bedtime.          Marland Kitchen azaTHIOprine (IMURAN) 50 MG tablet   Oral   Take 100 mg by mouth daily.          Marland Kitchen esomeprazole (NEXIUM) 40 MG capsule   Oral   Take 40 mg by mouth daily before breakfast.         . famciclovir (FAMVIR) 500 MG tablet   Oral   Take 1 tablet (500 mg total) by mouth 3 (three) times daily.   30 tablet   0   . oxyCODONE-acetaminophen (PERCOCET) 10-325 MG per tablet   Oral   Take 1 tablet by mouth every 4 (four) hours as needed for pain.  40 tablet   0   . predniSONE (DELTASONE) 10 MG tablet   Oral   Take 30 mg by mouth daily.         . pregabalin (LYRICA) 200 MG capsule   Oral   Take 200 mg by mouth daily.         Marland Kitchen torsemide (DEMADEX) 20 MG tablet   Oral   Take 20 mg by mouth daily as needed. Fluid retention           BP 117/70  Pulse 82  Temp(Src) 97.7 F (36.5 C) (Oral)  Resp 18  SpO2 98%  LMP 01/17/2013  Physical Exam  Nursing note and vitals reviewed. Constitutional: She is oriented to person, place, and time. She appears well-developed and well-nourished.  HENT:  Head: Normocephalic and atraumatic.  Eyes: EOM are normal. Pupils are equal, round, and reactive to light.  Neck: Neck supple.  Cardiovascular: Normal heart sounds and intact distal pulses.   Pulmonary/Chest: Effort normal. No respiratory distress.  Musculoskeletal:  Normal range of motion. She exhibits no edema.  Left sacral shingles lesions present  Neurological: She is alert and oriented to person, place, and time.  Skin: Skin is warm and dry.    ED Course  Procedures (including critical care time)  Results for orders placed during the hospital encounter of 02/10/13  URINALYSIS, ROUTINE W REFLEX MICROSCOPIC      Result Value Range   Color, Urine YELLOW  YELLOW   APPearance CLEAR  CLEAR   Specific Gravity, Urine 1.006  1.005 - 1.030   pH 6.5  5.0 - 8.0   Glucose, UA NEGATIVE  NEGATIVE mg/dL   Hgb urine dipstick MODERATE (*) NEGATIVE   Bilirubin Urine NEGATIVE  NEGATIVE   Ketones, ur NEGATIVE  NEGATIVE mg/dL   Protein, ur NEGATIVE  NEGATIVE mg/dL   Urobilinogen, UA 0.2  0.0 - 1.0 mg/dL   Nitrite NEGATIVE  NEGATIVE   Leukocytes, UA SMALL (*) NEGATIVE  URINE MICROSCOPIC-ADD ON      Result Value Range   Squamous Epithelial / LPF RARE  RARE   WBC, UA 0-2  <3 WBC/hpf   RBC / HPF 3-6  <3 RBC/hpf   Bacteria, UA RARE  RARE  POCT PREGNANCY, URINE      Result Value Range   Preg Test, Ur NEGATIVE  NEGATIVE  POCT I-STAT, CHEM 8      Result Value Range   Sodium 139  135 - 145 mEq/L   Potassium 3.8  3.5 - 5.1 mEq/L   Chloride 105  96 - 112 mEq/L   BUN 16  6 - 23 mg/dL   Creatinine, Ser 1.47  0.50 - 1.10 mg/dL   Glucose, Bld 88  70 - 99 mg/dL   Calcium, Ion 8.29  5.62 - 1.23 mmol/L   TCO2 25  0 - 100 mmol/L   Hemoglobin 12.6  12.0 - 15.0 g/dL   HCT 13.0  86.5 - 78.4 %    UA on arrival reviewed as above   Urinating at this time, post void residual checked = 41 and 43cc estimated  IV Dilaudid  5:25 AM recheck symptoms improving - no urinary retention or indication for Foley catheter at this time. Plan followup with primary care physician, urology referral provided.  Patient agrees to take pain medications as prescribed - 2 pills every 4 hours as needed. She agrees to strict return precautions and is stable for discharge home. MDM   Left-sided sacral shingles infection active  with Urinary frequency no UTI. Symptoms likely due to her shingles - review of old records did require Foley catheter for related urinary retention - although patient is very worried about urinary retention she does not have any at this time  Evaluated with urinalysis and labs reviewed as above.  Symptoms improved with IV narcotics  Vital signs and nursing notes reviewed and considered     Sunnie Nielsen, MD 02/10/13 [phone]

## 2013-02-10 NOTE — ED Notes (Signed)
Bladder scan after attempt to void with no urine voided. Bladder scan post void attempt resulted in 41 and 43ml scans

## 2013-02-11 ENCOUNTER — Encounter: Payer: Self-pay | Admitting: Family Medicine

## 2013-02-11 ENCOUNTER — Ambulatory Visit (HOSPITAL_COMMUNITY)
Admission: RE | Admit: 2013-02-11 | Discharge: 2013-02-11 | Disposition: A | Discharge status: Home / Self Care | Payer: 59 | Source: Ambulatory Visit | Attending: Family Medicine | Admitting: Family Medicine

## 2013-02-11 ENCOUNTER — Ambulatory Visit (INDEPENDENT_AMBULATORY_CARE_PROVIDER_SITE_OTHER): Payer: 59 | Admitting: Family Medicine

## 2013-02-11 ENCOUNTER — Encounter (HOSPITAL_COMMUNITY): Payer: 59

## 2013-02-11 VITALS — BP 110/72 | Wt 200.8 lb

## 2013-02-11 DIAGNOSIS — M79609 Pain in unspecified limb: Secondary | ICD-10-CM

## 2013-02-11 DIAGNOSIS — R339 Retention of urine, unspecified: Secondary | ICD-10-CM | POA: Insufficient documentation

## 2013-02-11 DIAGNOSIS — R609 Edema, unspecified: Secondary | ICD-10-CM

## 2013-02-11 DIAGNOSIS — R600 Localized edema: Secondary | ICD-10-CM

## 2013-02-11 DIAGNOSIS — F32A Depression, unspecified: Secondary | ICD-10-CM

## 2013-02-11 DIAGNOSIS — M797 Fibromyalgia: Secondary | ICD-10-CM | POA: Insufficient documentation

## 2013-02-11 DIAGNOSIS — IMO0001 Reserved for inherently not codable concepts without codable children: Secondary | ICD-10-CM

## 2013-02-11 DIAGNOSIS — R109 Unspecified abdominal pain: Secondary | ICD-10-CM

## 2013-02-11 DIAGNOSIS — F3289 Other specified depressive episodes: Secondary | ICD-10-CM

## 2013-02-11 DIAGNOSIS — B029 Zoster without complications: Secondary | ICD-10-CM

## 2013-02-11 DIAGNOSIS — F329 Major depressive disorder, single episode, unspecified: Secondary | ICD-10-CM

## 2013-02-11 LAB — HEPATIC FUNCTION PANEL
ALT: 14 U/L (ref 0–35)
AST: 11 U/L (ref 0–37)
Albumin: 4.4 g/dL (ref 3.5–5.2)
Alkaline Phosphatase: 46 U/L (ref 39–117)
Bilirubin, Direct: 0.1 mg/dL (ref 0.0–0.3)
Indirect Bilirubin: 0.6 mg/dL (ref 0.0–0.9)
Total Bilirubin: 0.7 mg/dL (ref 0.3–1.2)
Total Protein: 6.9 g/dL (ref 6.0–8.3)

## 2013-02-11 LAB — BASIC METABOLIC PANEL
BUN: 12 mg/dL (ref 6–23)
CO2: 28 mEq/L (ref 19–32)
Calcium: 9.7 mg/dL (ref 8.4–10.5)
Chloride: 100 mEq/L (ref 96–112)
Creat: 0.81 mg/dL (ref 0.50–1.10)
Glucose, Bld: 78 mg/dL (ref 70–99)
Potassium: 4.5 mEq/L (ref 3.5–5.3)
Sodium: 139 mEq/L (ref 135–145)

## 2013-02-11 LAB — CK: Total CK: 11 U/L (ref 7–177)

## 2013-02-11 LAB — LIPASE: Lipase: 29 U/L (ref 0–75)

## 2013-02-11 MED ORDER — CITALOPRAM HYDROBROMIDE 20 MG PO TABS: 20.0000 mg | ORAL_TABLET | Freq: Every day | ORAL | Status: DC

## 2013-02-11 NOTE — Progress Notes (Signed)
  Subjective:    Patient ID: Kathleen [name], female    DOB: 1961/04/06, 52 y.o.   MRN: [id number]  HPI #1 shingles-actually doing much better than what it was. She is pretty much finished up the medication. #2 shingles-related pain is decreasing not having he use oxycodone as often #3 patient relates muscle aches all over this been going on for weeks concerning for the possibility of fibromyalgia flareup. #4 patient relates intermittent trouble with urination she has had indwelling catheter wants had to go to the ER second time and feels like she has a hard time emptying her bladder currently. This started after her shingles. It certainly is true that shingles can cause neuropathy and given the location of her shingles it may be affecting the ability to empty her bladder. #5 patient relates feeling somewhat depressed at times not suicidal she just feels tired of all her sickness she cannot work and it does not look like she really go back to work anytime soon #6 patient has significant sarcoidosis. She sees a specialist should be seen a pulmonary specialist later this summer sure see her neurologist next week  Past medical history-sarcoidosis-urinary retention-fibromyalgia, shingles Family history noncontributory social her husband helps her out as best as he can   Review of Systems She denies shortness of breath she denies headaches nausea vomiting she just feels bad she has a frequent bloating sensation in her abdomen no swelling in her legs    Objective:   Physical Exam Eardrums normal throat normal neck is supple no masses lungs are clear no crackles heart is regular abdomen is soft no guarding rebound or tenderness skin warm dry       Assessment & Plan:  #1 fibromyalgia-she tried Cymbalta didn't help Lyrica seems to be helping a little bit I told her narcotics or not a good plan for long-term use. #2 shingles and shingles-related pain gradually taper off narcotics #3 urinary retention  consultation with urology. Ultrasound today. #4 depression Celexa 20 mg daily followup within a few weeks patient not suicidal followup sooner if worse #5 patient is disabled currently possibly be able to return to work by the end of July or August see patient back in a few weeks

## 2013-02-12 LAB — CBC WITH DIFFERENTIAL/PLATELET
Basophils Absolute: 0.1 10*3/uL (ref 0.0–0.1)
Basophils Relative: 1 % (ref 0–1)
Eosinophils Absolute: 0.1 10*3/uL (ref 0.0–0.7)
Eosinophils Relative: 1 % (ref 0–5)
HCT: 37.1 % (ref 36.0–46.0)
Hemoglobin: 12.4 g/dL (ref 12.0–15.0)
Lymphocytes Relative: 22 % (ref 12–46)
Lymphs Abs: 2.1 10*3/uL (ref 0.7–4.0)
MCH: 27.8 pg (ref 26.0–34.0)
MCHC: 33.4 g/dL (ref 30.0–36.0)
MCV: 83.2 fL (ref 78.0–100.0)
Monocytes Absolute: 0.6 10*3/uL (ref 0.1–1.0)
Monocytes Relative: 7 % (ref 3–12)
Neutro Abs: 6.7 10*3/uL (ref 1.7–7.7)
Neutrophils Relative %: 69 % (ref 43–77)
Platelets: 412 10*3/uL — ABNORMAL HIGH (ref 150–400)
RBC: 4.46 MIL/uL (ref 3.87–5.11)
RDW: 17.4 % — ABNORMAL HIGH (ref 11.5–15.5)
WBC: 9.5 10*3/uL (ref 4.0–10.5)

## 2013-02-12 LAB — SEDIMENTATION RATE: Sed Rate: 13 mm/hr (ref 0–22)

## 2013-02-13 ENCOUNTER — Encounter (HOSPITAL_COMMUNITY): Payer: 59

## 2013-02-13 ENCOUNTER — Other Ambulatory Visit: Payer: Self-pay | Admitting: Obstetrics and Gynecology

## 2013-02-13 ENCOUNTER — Ambulatory Visit (INDEPENDENT_AMBULATORY_CARE_PROVIDER_SITE_OTHER): Payer: 59 | Admitting: Urology

## 2013-02-13 DIAGNOSIS — R338 Other retention of urine: Secondary | ICD-10-CM

## 2013-02-13 DIAGNOSIS — Z1231 Encounter for screening mammogram for malignant neoplasm of breast: Secondary | ICD-10-CM

## 2013-02-16 ENCOUNTER — Encounter: Payer: Self-pay | Admitting: *Deleted

## 2013-02-16 ENCOUNTER — Encounter (HOSPITAL_COMMUNITY): Payer: 59

## 2013-02-16 ENCOUNTER — Telehealth: Payer: Self-pay | Admitting: *Deleted

## 2013-02-16 LAB — MICROSPORIDIA SPORE STAIN, FECES

## 2013-02-16 NOTE — Telephone Encounter (Signed)
tcna [id number]

## 2013-02-18 ENCOUNTER — Encounter (HOSPITAL_COMMUNITY): Payer: 59

## 2013-02-18 ENCOUNTER — Ambulatory Visit: Payer: 59 | Admitting: Family Medicine

## 2013-02-18 ENCOUNTER — Other Ambulatory Visit: Payer: Self-pay | Admitting: Family Medicine

## 2013-02-18 ENCOUNTER — Telehealth: Payer: Self-pay | Admitting: Family Medicine

## 2013-02-18 NOTE — Telephone Encounter (Signed)
Spoke with pt, will make an appointment to be seen

## 2013-02-18 NOTE — Telephone Encounter (Signed)
Refill times two

## 2013-02-18 NOTE — Telephone Encounter (Signed)
Patient called to inform Dr. Lorin Picket she can not urinate on her own, she has to use in/out Cath to urinate.  States she has several appointments with Urologist, Lung Specialist, Neuro MD, Stomach Specialist, etc as far out as March 30, 2013.  Ms. Tewell would like a note/letter stating she is out of work from June 4th, 2014 to undetermined time.  States she does not know when she will get to return to work.  Also needs copy of all medical records for the past several months (atlest until beginning of May of this year), for work and Scientist, forensic.  At this point she has shingles at this point for the past 3 weeks.    Also, states she is having serve depression and would like medication for this if possible.  Please call patient for further information.

## 2013-02-18 NOTE — Telephone Encounter (Signed)
Spoke with patient to inform of medication called into [facility] outpatient pharmacy

## 2013-02-18 NOTE — Telephone Encounter (Signed)
This patient is on celexa, discuss her depression, i recommend appt Thur with me or next week with Eber Jones

## 2013-02-18 NOTE — Telephone Encounter (Signed)
Pt to make an appointment with Dr. Lorin Picket this week or with Sherie Don next week.

## 2013-02-19 ENCOUNTER — Encounter: Payer: Self-pay | Admitting: Family Medicine

## 2013-02-19 ENCOUNTER — Ambulatory Visit (INDEPENDENT_AMBULATORY_CARE_PROVIDER_SITE_OTHER): Payer: 59 | Admitting: Family Medicine

## 2013-02-19 VITALS — BP 118/76 | Ht 70.0 in | Wt 204.1 lb

## 2013-02-19 DIAGNOSIS — F329 Major depressive disorder, single episode, unspecified: Secondary | ICD-10-CM

## 2013-02-19 DIAGNOSIS — R600 Localized edema: Secondary | ICD-10-CM

## 2013-02-19 DIAGNOSIS — B029 Zoster without complications: Secondary | ICD-10-CM

## 2013-02-19 DIAGNOSIS — R609 Edema, unspecified: Secondary | ICD-10-CM

## 2013-02-19 DIAGNOSIS — M797 Fibromyalgia: Secondary | ICD-10-CM

## 2013-02-19 DIAGNOSIS — M79609 Pain in unspecified limb: Secondary | ICD-10-CM

## 2013-02-19 DIAGNOSIS — R079 Chest pain, unspecified: Secondary | ICD-10-CM

## 2013-02-19 DIAGNOSIS — F32A Depression, unspecified: Secondary | ICD-10-CM

## 2013-02-19 DIAGNOSIS — IMO0001 Reserved for inherently not codable concepts without codable children: Secondary | ICD-10-CM

## 2013-02-19 DIAGNOSIS — D869 Sarcoidosis, unspecified: Secondary | ICD-10-CM

## 2013-02-19 DIAGNOSIS — F411 Generalized anxiety disorder: Secondary | ICD-10-CM

## 2013-02-19 DIAGNOSIS — F3289 Other specified depressive episodes: Secondary | ICD-10-CM

## 2013-02-19 DIAGNOSIS — Z0000 Encounter for general adult medical examination without abnormal findings: Secondary | ICD-10-CM

## 2013-02-19 DIAGNOSIS — R109 Unspecified abdominal pain: Secondary | ICD-10-CM

## 2013-02-19 DIAGNOSIS — R339 Retention of urine, unspecified: Secondary | ICD-10-CM

## 2013-02-19 MED ORDER — PREGABALIN 150 MG PO CAPS: 150.0000 mg | ORAL_CAPSULE | Freq: Two times a day (BID) | ORAL | Status: DC

## 2013-02-19 MED ORDER — OXYBUTYNIN CHLORIDE ER 5 MG PO TB24: 5.0000 mg | ORAL_TABLET | Freq: Every day | ORAL | Status: DC

## 2013-02-19 MED ORDER — CITALOPRAM HYDROBROMIDE 20 MG PO TABS: 20.0000 mg | ORAL_TABLET | Freq: Every day | ORAL | Status: DC

## 2013-02-19 MED ORDER — CITALOPRAM HYDROBROMIDE 40 MG PO TABS: 40.0000 mg | ORAL_TABLET | Freq: Every day | ORAL | Status: DC

## 2013-02-19 NOTE — Progress Notes (Signed)
Subjective:    Patient ID: Kathleen [name], female    DOB: 02-Feb-1961, 52 y.o.   MRN: [id number]  HPI Patient states she has multiple concerns about her overall health. She is dealing with depression due to her health issues and needs medical attention for it at this point. States it has been going on for awhile now. Denies any suicidal or homicidal intentions. This patient comes in today very overwhelmed by what's been going on with her. She has sarcoid she is on 2 different medications which lowers the median response. As a result of all of this she does have shortness of breath with activity and can barely walk any significant distance more than 200 feet without severe shortness of breath. 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 She no longer has active shingles but she does have the neuropathy as a result which is causing her to have urinary retention. She sees a urologist for this we discussed this at length and how it is possible that this may be long-term for her.  Patient finds herself feeling depressed she is not suicidal she does find herself feeling distraught about things often saying what am I going to do she is unable to really do much at all her husband states she lays around in bed all day because of the pain and discomfort and has to do catheterizations every 2-3 hours. She also describes a lot of bladder spasms as well.  She also relates severe chest pain intermittently over the past few weeks she states she'll do no get better then on 8 get better it's worse when she's anxious or when she's nervous she denies any angina symptoms such as pressure.  She also has sciatica down the left but not around to the vagina area related to the shingles.  It's a slightly better but she's stressed use oxycodone for this she states she takes a medicine responsibly she's not suicidal. Past medical history was reviewed with patient. Family history reviewed. Social does not smoke.  Review of Systems Denies wheezing relates shortness of breath relates intermittent chest pains also intermittent lower abdominal pain she does have a ultrasound coming up she also states severe sciatica-type symptoms in the left but I that is result of the neuropathy from the sciatica. She also relates crying spells and anxiousness. She denies any fevers she states she has low appetite    Objective:   Physical Exam Neck no masses eardrums normal throat is normal neck is supple lungs are clear no crackles heart is regular abdomen is soft subjective discomfort down the left but I       Assessment & Plan:  Depression - Plan: Ambulatory referral to Psychology  Routine general medical examination at a health care facility - Plan: EKG 12-Lead  Abdominal  pain, other specified site  Fibromyalgia  Urinary retention  Shingles  Pedal edema  ANXIETY  CHEST PAIN  Sarcoidosis  Pain in limb  We talked at length about all that's going on with her. EKG looks normal. I do believe this patient is suffering with depression start Celexa 20 mg daily also recommend counseling with psychology. Also recommend she followup in 3 weeks. In addition to this no further testing for the chest pain at this time. Lower  abdominal discomfort is nonspecific currently she has a followup with gastroenterology she also has ultrasound coming up. The fibromyalgia is giving her severe amount of pain but I would not recommend any additional medicine for this. Certainly we could increase Lyrica 300 mg daily and hopefully that will help as well also in addition to all this she has urinary retention she sees urologist she is doing and now 72-3 hours she will use Ditropan XL 5 mg daily see if that helps  with some of the bladder spasms in addition to this she is disabled by the combination of sarcoid, fibromyalgia, shingles, post shingles neuralgia, urinary retention due to the shingles neuralgia and nerve damage. We'll see this patient back in 3 weeks there is absolutely no way this patient will be out of work anywhere in the next 90 days it is hard to tell her sugar average get back her improved enough to go back to work. My hopes would be is that her mental health will improve to where she will be able to gather herself to fight against these multiple illnesses. If she becomes suicidal she is immediately go to the ER call us sooner if any problems followup in 3 weeks. It should be noted that approximately 45 minutes was spent with this patient.

## 2013-02-19 NOTE — Patient Instructions (Signed)
glycolax 1 capful daily in fluids  May use senokot as needed as a stimulant

## 2013-02-20 ENCOUNTER — Telehealth: Payer: Self-pay | Admitting: Family Medicine

## 2013-02-20 ENCOUNTER — Encounter (HOSPITAL_COMMUNITY): Payer: 59

## 2013-02-20 NOTE — Telephone Encounter (Signed)
Patient needs medical records from May until now faxed to Kathleen [name] at [phone]. She would like a copy of what is sent and a copy of confirmation.

## 2013-02-23 ENCOUNTER — Encounter (HOSPITAL_COMMUNITY): Payer: 59

## 2013-02-25 ENCOUNTER — Encounter (HOSPITAL_COMMUNITY): Payer: 59

## 2013-02-27 ENCOUNTER — Encounter (HOSPITAL_COMMUNITY): Payer: 59

## 2013-03-02 ENCOUNTER — Encounter (HOSPITAL_COMMUNITY): Payer: 59

## 2013-03-03 ENCOUNTER — Ambulatory Visit (INDEPENDENT_AMBULATORY_CARE_PROVIDER_SITE_OTHER): Payer: 59 | Admitting: Internal Medicine

## 2013-03-04 ENCOUNTER — Ambulatory Visit: Payer: 59 | Admitting: Family Medicine

## 2013-03-04 ENCOUNTER — Encounter (HOSPITAL_COMMUNITY): Payer: 59

## 2013-03-05 ENCOUNTER — Ambulatory Visit (INDEPENDENT_AMBULATORY_CARE_PROVIDER_SITE_OTHER): Payer: 59 | Admitting: Psychology

## 2013-03-05 DIAGNOSIS — F411 Generalized anxiety disorder: Secondary | ICD-10-CM

## 2013-03-05 DIAGNOSIS — F332 Major depressive disorder, recurrent severe without psychotic features: Secondary | ICD-10-CM

## 2013-03-06 ENCOUNTER — Encounter (HOSPITAL_COMMUNITY): Payer: 59

## 2013-03-09 ENCOUNTER — Encounter (HOSPITAL_COMMUNITY): Payer: 59

## 2013-03-11 ENCOUNTER — Encounter (HOSPITAL_COMMUNITY): Payer: 59

## 2013-03-13 ENCOUNTER — Encounter (HOSPITAL_COMMUNITY): Payer: 59

## 2013-03-16 ENCOUNTER — Ambulatory Visit (INDEPENDENT_AMBULATORY_CARE_PROVIDER_SITE_OTHER): Payer: Self-pay | Admitting: Psychology

## 2013-03-16 ENCOUNTER — Encounter (HOSPITAL_COMMUNITY)
Admission: RE | Admit: 2013-03-16 | Discharge: 2013-03-16 | Disposition: A | Discharge status: Home / Self Care | Payer: 59 | Source: Ambulatory Visit | Attending: Family Medicine | Admitting: Family Medicine

## 2013-03-16 DIAGNOSIS — D869 Sarcoidosis, unspecified: Secondary | ICD-10-CM | POA: Insufficient documentation

## 2013-03-16 DIAGNOSIS — F332 Major depressive disorder, recurrent severe without psychotic features: Secondary | ICD-10-CM

## 2013-03-16 DIAGNOSIS — Z5189 Encounter for other specified aftercare: Secondary | ICD-10-CM | POA: Insufficient documentation

## 2013-03-16 DIAGNOSIS — F411 Generalized anxiety disorder: Secondary | ICD-10-CM

## 2013-03-17 ENCOUNTER — Encounter: Payer: Self-pay | Admitting: Family Medicine

## 2013-03-17 ENCOUNTER — Ambulatory Visit (INDEPENDENT_AMBULATORY_CARE_PROVIDER_SITE_OTHER): Payer: 59 | Admitting: Family Medicine

## 2013-03-17 ENCOUNTER — Ambulatory Visit: Payer: 59

## 2013-03-17 VITALS — BP 112/76 | Wt 202.2 lb

## 2013-03-17 DIAGNOSIS — F329 Major depressive disorder, single episode, unspecified: Secondary | ICD-10-CM

## 2013-03-17 DIAGNOSIS — F3289 Other specified depressive episodes: Secondary | ICD-10-CM

## 2013-03-17 DIAGNOSIS — B029 Zoster without complications: Secondary | ICD-10-CM

## 2013-03-17 DIAGNOSIS — F411 Generalized anxiety disorder: Secondary | ICD-10-CM

## 2013-03-17 DIAGNOSIS — R339 Retention of urine, unspecified: Secondary | ICD-10-CM

## 2013-03-17 DIAGNOSIS — F32A Depression, unspecified: Secondary | ICD-10-CM

## 2013-03-17 DIAGNOSIS — IMO0001 Reserved for inherently not codable concepts without codable children: Secondary | ICD-10-CM

## 2013-03-17 DIAGNOSIS — M797 Fibromyalgia: Secondary | ICD-10-CM

## 2013-03-17 MED ORDER — ALPRAZOLAM 0.5 MG PO TABS: ORAL_TABLET | ORAL | Status: DC

## 2013-03-17 NOTE — Progress Notes (Signed)
  Subjective:    Patient ID: Kathleen [name], female    DOB: 1961/01/20, 52 y.o.   MRN: [id number]  HPI This patient complains of aching all over she just doesn't feel good she describes body aches discomforts soreness just not feeling well. She thinks she is having a flare up of the far mild jet may be worse because of her depression. Her depression is fairly stable but at times she feels better other times she feels sad she denies being suicidal. She is going to be seen a psychiatrist in the near future and is doing counseling currently. Her neuropathy is still giving her severe pain in the left leg Lyrica seems to be helping to some degree she uses pain medicine but she tries not to over use them because they make her nauseous Urinary retention is still a significant problem she is concerned because her urine has a heavy odor to it I would recommend a urine analysis as well as urine culture she is seeing urologist later this week. She does in and out tasks multiple times a day She relates severe fatigue and tiredness related to her illnesses she relates is very difficult to just get through the day she is unable to do much in the way of walking because of her pulmonary fibrosis/sarcoidosis she sees a specialist she has a lot of misgivings about everything going on. Pulmonary sarcoidosis still giving her significant problems. Past medical history family history social history reviewed patient does not smoke   Review of Systems    no vomiting no rectal bleeding denies joint pain please see above for other details Objective:   Physical Exam Blood pressure is good lungs diminished air movement but lungs sound good hearts regular pulse normal abdomen soft no guarding or rebound extremities no edema skin warm dry neurologic grossly normal patient's affect is blunted. She does get fairly anxious about things   I believe that this patient is trying hard but she is overwhelmed by her problems there is  no way that she can work currently she should file for disability between her pulmonary problems the shingles the neural neuropathy and urinary retention it would be very difficult for this patient her regular job and try to keep it. I don't feel she would be capable of doing so. We will see her back in approximately 2-3 months sooner if any problems.  Xanax 0.5 mg she may take a half tablet during the day on a when necessary basis not to over use it not to drive with it use occasionally. May take a sterile 0.5 mg at nighttime to help her rest she will see the psychiatrist in the near future    Assessment & Plan:  Fibromyalgia  Depression  Shingles  Urinary retention - Plan: Urine culture, Urinalysis  ANXIETY  25 minutes was spent with patient

## 2013-03-18 ENCOUNTER — Telehealth: Payer: Self-pay | Admitting: Family Medicine

## 2013-03-18 ENCOUNTER — Encounter (HOSPITAL_COMMUNITY): Payer: 59

## 2013-03-18 ENCOUNTER — Encounter (HOSPITAL_COMMUNITY): Payer: Self-pay | Admitting: Psychology

## 2013-03-18 LAB — URINALYSIS
Bilirubin Urine: NEGATIVE
Glucose, UA: NEGATIVE mg/dL
Hgb urine dipstick: NEGATIVE
Ketones, ur: NEGATIVE mg/dL
Nitrite: POSITIVE — AB
Protein, ur: NEGATIVE mg/dL
Specific Gravity, Urine: 1.02 (ref 1.005–1.030)
Urobilinogen, UA: 0.2 mg/dL (ref 0.0–1.0)
pH: 5.5 (ref 5.0–8.0)

## 2013-03-18 NOTE — Progress Notes (Signed)
Patient:   Kathleen [name]   DOB:   Mar 27, 1961  MR Number:  [id number]  Location:  [facility]HAVIORAL [facility] [address] [place] [address] Dept: [phone]           Date of Service:   03/05/2013  Start Time:   10 AM End Time:   11 AM  Provider/Observer:  Hershal Coria PSYD       Billing Code/Service: [phone]  Chief Complaint:     Chief Complaint  Patient presents with  . Anxiety  . Depression  . Stress  . Trauma    Reason for Service:  The patient was referred back to our office by her treating physicians Kathleen Punt, MD do to significant increases in anxiety and depression I seen the patient back in 2003 and again in 2009 for severe depression and anxiety. Significant psychosocial features and stressors were present at that time. The patient also been diagnosed with fibromyalgia and other pain related conditions. 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 She was told 10 years ago according to the patient, that her condition was in a non-active phase. However, she had increasing problems with breathing and even though her doctor at that time did not think it was an active stay Kathleen [name] set her up to see someone in [place] because of her concerns and his concerns about the situation. She was diagnosed with an active case and had lost 50% of her lung function and needed immune suppressant medications. Since that time, she is developed other subsequent conditions likely as a result of these needed medications. This includes the development of an active case of shingles which is causing her a great deal of pain and bladder difficulties at this point.   The patient has been experiencing severe depression and anxiety and constant ruminations about frustration and fear towards past Drs. that the patient felt did not listen to her and her reports of  difficulties. These are the patient's impressions and has not had any communications with previous doctors but it is clear that she is very upset about the permanent loss of lung function and he is enormously grateful to Kathleen [name] for identifying the need for further intervention and setting her up with the specialist in [facility] who is able to diagnosis and begin treating her condition.  Current Status:  The patient comes in today extremely fragile and clearly has severe depressive and anxious state. Constant ruminations about her medical status and the lifelong implications this will have her present. The patient expresses anger and frustration and sadness about previous medical interventions or lack thereof over the past decade and she feels like she was regularly told her situation was stable and was not an active case. The patient believes that her current pulmonologist is told her that he would she come to see them 10 years ago and she regularly ruminates about this issue.  Reliability of Information: The information was provided by the patient as well as review of available medical records. I will also talk with Kathleen [name] regarding her case and this referral.  Behavioral Observation: Kathleen [name]  presents as a 52 y.o.-year-old Right Caucasian Female who appeared her stated age. her dress was Appropriate and she was Well Groomed and her manners were Appropriate to the situation.  There were any physical disabilities noted primarily related to weakness and apparent reduction in  lung capacity.  she displayed an appropriate level of cooperation and motivation.    Interactions:    Active   Attention:   The patient is clearly distracted by internal preoccupations and there are clear problems with ruminations and fixations on her medical status and difficulties.  Memory:   The patient is describing some memory problems likely do to severe depression and anxiety.  Visuo-spatial:   within normal  limits  Speech (Volume):  low  Speech:   normal pitch  Thought Process:  Coherent and Ruminations  Though Content:  Rumination  Orientation:   person, place, time/date and situation  Judgment:   Fair  Planning:   Fair  Affect:    Depressed and Labile  Mood:    Angry and Depressed  Insight:   Fair  Intelligence:   normal  Current Employment: The patient has been working at [employer] for some time. However, she has been taken out of work because of her medical issues and clearly is not in a situation where she can return to work at this time.  Substance Use:  No concerns of substance abuse are reported.    Education:   College  Medical History:   Past Medical History  Diagnosis Date  . Sarcoidosis   . Breast fibrocystic disorder   . Mitral valve prolapse   . Breast pain     right  . Normal stress echocardiogram Jan 2011  . Normal nuclear stress test December 2012  . Normal echocardiogram December 2012    EF 55 to 60%; mild MVP, grade 1 diastolic dysfunction, PAP 32  . Fibromyalgia   . DOE (dyspnea on exertion)   . Pulmonary fibrosis   . Gastroesophageal reflux disease   . Heart disease         Outpatient Encounter Prescriptions as of 03/05/2013  Medication Sig Dispense Refill  . azaTHIOprine (IMURAN) 50 MG tablet Take 100 mg by mouth daily.       . citalopram (CELEXA) 20 MG tablet Take 1 tablet (20 mg total) by mouth daily.  90 tablet  1  . esomeprazole (NEXIUM) 40 MG capsule Take 40 mg by mouth daily before breakfast.      . famciclovir (FAMVIR) 500 MG tablet Take 1 tablet (500 mg total) by mouth 3 (three) times daily.  30 tablet  0  . oxybutynin (DITROPAN-XL) 5 MG 24 hr tablet Take 1 tablet (5 mg total) by mouth daily.  90 tablet  2  . oxyCODONE-acetaminophen (PERCOCET) 10-325 MG per tablet Take 1 tablet by mouth every 4 (four) hours as needed for pain.  40 tablet  0  . oxyCODONE-acetaminophen (PERCOCET/ROXICET) 5-325 MG per tablet Take 2 tablets by  mouth every 4 (four) hours as needed for pain.  15 tablet  0  . predniSONE (DELTASONE) 10 MG tablet Take 30 mg by mouth daily.      . pregabalin (LYRICA) 150 MG capsule Take 1 capsule (150 mg total) by mouth 2 (two) times daily.  180 capsule  2  . torsemide (DEMADEX) 20 MG tablet Take 20 mg by mouth daily as needed. Fluid retention      . [DISCONTINUED] ALPRAZolam (XANAX) 0.5 MG tablet Take 0.5 mg by mouth at bedtime.        No facility-administered encounter medications on file as of 03/05/2013.          Sexual History:   History  Sexual Activity  . Sexually Active: Not on file    Abuse/Trauma  History: The patient does have a history of traumatic experiences in the past.  Psychiatric History:  The patient has a history going back at least until 2003 when I first saw the patient having to do with major depressive disorder and anxiety disorder.  Family Med/Psych History:  Family History  Problem Relation Age of Onset  . Cardiomyopathy Mother   . Breast cancer Mother   . Heart disease Maternal Aunt   . Prostate cancer Father   . Colon cancer Neg Hx   . Neurofibromatosis Brother     Risk of Suicide/Violence: low the patient denies any suicidal or homicidal ideation at this point.  Impression/DX:  The patient is clearly overwhelmed by her current situation and significant reduction in physical functioning primarily related to her autoimmune disorder and its significant deleterious effect it is having on her pulmonary function. She has been told she has at least lost 50% of her lung function and it will not come back and they're trying to keep her from losing more. The patient has a history of significant depression and anxiety in the past as well as traumatic experiences. The patient is severely depressed and anxious at this point and overwhelmed by her status in ruminating about her situation.   Disposition/Plan:   we will set the patient up for individual psychotherapeutic  interventions and coordinate care with her treating physician.  Diagnosis:    Axis I:  Major depressive disorder, recurrent episode, severe, without mention of psychotic behavior  Generalized anxiety disorder

## 2013-03-18 NOTE — Progress Notes (Signed)
Patient:  Kathleen [name]   DOB: 01-10-1961  MR Number: [id number]  Location: BEHAVIORAL [facility] [address] [place] [address] Dept: [phone]  Start: 9 AM End: 10 AM  Provider/Observer:     Hershal Coria PSYD  Chief Complaint:      Chief Complaint  Patient presents with  . Anxiety  . Depression    Reason For Service:    The patient was referred back to our office by her treating physicians [name], MD do to significant increases in anxiety and depression I seen the patient back in 2003 and again in 2009 for severe depression and anxiety. Significant psychosocial features and stressors were present at that time. The patient also been diagnosed with fibromyalgia and other pain related conditions. 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 She was told 10 years ago according to the patient, that her condition was in a non-active phase. However, she had increasing problems with breathing and even though her doctor at that time did not think it was an active stay Dr. Gerda Diss set her up to see someone in [place] because of her concerns and his concerns about the situation. She was diagnosed with an active case and had lost 50% of her lung function and needed immune suppressant medications. Since that time, she is developed other subsequent conditions likely as a result of these needed medications. This includes the development of an active case of shingles which is causing her a great deal of pain and bladder difficulties at this point. The patient has been experiencing severe depression and anxiety and constant ruminations about frustration and fear towards past Drs. that the patient felt did not listen to her and her reports of difficulties. These are the patient's impressions and has not had any communications with previous doctors but it is clear  that she is very upset about the permanent loss of lung function and he is enormously grateful to Dr. Gerda Diss for identifying the need for further intervention and setting her up with the specialist in [facility] who is able to diagnosis and begin treating her condition.   Interventions Strategy:  Cognitive/behavioral psychotherapeutic interventions  Participation Level:   Active  Participation Quality:  Appropriate      Behavioral Observation:  Well Groomed, Alert, and Depressed and Tearful.   Current Psychosocial Factors: The patient reports that she is continued to struggle mildly with her current medical status and has struggled with severe pain and bladder difficulties from residual effects of her shingles outbreak.  The patient reports that she continues to experience anxiety ruminations.  Content of Session:   Review current symptoms and continued work on therapeutic interventions.  Current Status:   The patient is doing very poorly with regard to her psychiatric status. She denies any suicidal ideation she is constantly ruminating about her current status and severe pain and second-guessing every medical move that she urinated without status. The patient does continue to feel very comfortable with Dr. Gerda Diss but is more worried about her lung doctor as the immunosuppressive medications that she has had a have contributed to the operative shingles. However, this was a medicine that she had to take.  Patient Progress:   Stable but continued to be severely depressed.  Target Goals:   Reduce the intensity, frequency, and duration of severe depression.  Last Reviewed:   03/16/2013  Goals Addressed Today:  Worked on issues specifically with her ruminating nature regarding her severe depression and anxiety.  Impression/Diagnosis:   The patient is clearly overwhelmed by her current situation and significant reduction in physical functioning primarily related to her autoimmune disorder and  its significant deleterious effect it is having on her pulmonary function. She has been told she has at least lost 50% of her lung function and it will not come back and they're trying to keep her from losing more. The patient has a history of significant depression and anxiety in the past as well as traumatic experiences. The patient is severely depressed and anxious at this point and overwhelmed by her status in ruminating about her situation.   Diagnosis:    Axis I: Major depressive disorder, recurrent episode, severe, without mention of psychotic behavior  Generalized anxiety disorder

## 2013-03-18 NOTE — Telephone Encounter (Signed)
Pt calling to say she would like for you to go ahead an write for her Handicap sticker, she forgot to get this from you yesterday. Call when form up front please.

## 2013-03-19 NOTE — Telephone Encounter (Signed)
Please fill in a form ALL the way except the reason and my signature

## 2013-03-19 NOTE — Telephone Encounter (Signed)
Patient notified form ready for pick up

## 2013-03-20 ENCOUNTER — Encounter (HOSPITAL_COMMUNITY)
Admission: RE | Admit: 2013-03-20 | Discharge: 2013-03-20 | Disposition: A | Discharge status: Home / Self Care | Payer: 59 | Source: Ambulatory Visit | Attending: Family Medicine | Admitting: Family Medicine

## 2013-03-20 ENCOUNTER — Ambulatory Visit (INDEPENDENT_AMBULATORY_CARE_PROVIDER_SITE_OTHER): Payer: 59 | Admitting: Urology

## 2013-03-20 DIAGNOSIS — N312 Flaccid neuropathic bladder, not elsewhere classified: Secondary | ICD-10-CM

## 2013-03-20 DIAGNOSIS — R338 Other retention of urine: Secondary | ICD-10-CM

## 2013-03-20 LAB — URINE CULTURE: Colony Count: >=100000

## 2013-03-23 ENCOUNTER — Encounter (HOSPITAL_COMMUNITY): Payer: 59

## 2013-03-23 MED ORDER — LEVOFLOXACIN 500 MG PO TABS: 500.0000 mg | ORAL_TABLET | Freq: Every day | ORAL | Status: AC

## 2013-03-23 NOTE — Addendum Note (Signed)
Addended by: Margaretha Sheffield on: 03/23/2013 04:52 PM   Modules accepted: Orders

## 2013-03-25 ENCOUNTER — Encounter (HOSPITAL_COMMUNITY): Payer: 59

## 2013-03-27 ENCOUNTER — Encounter (HOSPITAL_COMMUNITY): Payer: 59

## 2013-03-30 ENCOUNTER — Encounter (HOSPITAL_COMMUNITY): Payer: 59

## 2013-03-31 ENCOUNTER — Ambulatory Visit: Payer: 59 | Admitting: Family Medicine

## 2013-04-01 ENCOUNTER — Encounter (HOSPITAL_COMMUNITY): Payer: 59

## 2013-04-02 ENCOUNTER — Ambulatory Visit: Payer: 59

## 2013-04-03 ENCOUNTER — Encounter (HOSPITAL_COMMUNITY): Payer: 59

## 2013-04-06 ENCOUNTER — Encounter (HOSPITAL_COMMUNITY): Payer: 59

## 2013-04-08 ENCOUNTER — Encounter (HOSPITAL_COMMUNITY): Payer: 59

## 2013-04-08 ENCOUNTER — Ambulatory Visit (INDEPENDENT_AMBULATORY_CARE_PROVIDER_SITE_OTHER): Payer: 59 | Admitting: Family Medicine

## 2013-04-08 ENCOUNTER — Encounter: Payer: Self-pay | Admitting: Family Medicine

## 2013-04-08 VITALS — BP 128/82 | Ht 69.0 in | Wt 207.4 lb

## 2013-04-08 DIAGNOSIS — R339 Retention of urine, unspecified: Secondary | ICD-10-CM

## 2013-04-08 DIAGNOSIS — J019 Acute sinusitis, unspecified: Secondary | ICD-10-CM

## 2013-04-08 DIAGNOSIS — D869 Sarcoidosis, unspecified: Secondary | ICD-10-CM

## 2013-04-08 MED ORDER — CEFPROZIL 500 MG PO TABS: 500.0000 mg | ORAL_TABLET | Freq: Two times a day (BID) | ORAL | Status: DC

## 2013-04-08 NOTE — Patient Instructions (Addendum)
Loratadine 10 mg one daily   Postherpetic Neuralgia Shingles is a painful disease. It is caused by the herpes zoster virus. This is the same virus which also causes chickenpox. It can affect the torso, limbs, or the face. For most people, shingles is a condition of rather sudden onset. Pain usually lasts about 1 month. In older patients, or patients with poor immune systems, a painful, long-standing (chronic) condition called postherpetic neuralgia can develop. This condition rarely happens before age 2. But at least 50% of people over 50 become affected following an attack of shingles. There is a natural tendency for this condition to improve over time with no treatment. Less than 5% of patients have pain that lasts for more than 1 year. DIAGNOSIS  Herpes is usually easily diagnosed on physical exam. Pain sometimes follows when the skin sores (lesions) have disappeared. It is called postherpetic neuralgia. That name simply means the pain that follows herpes. TREATMENT   Treating this condition may be difficult. Usually one of the tricyclic antidepressants, often amitriptyline, is the first line of treatment. There is evidence that the sooner these medications are given, the more likely they are to reduce pain.  Conventional analgesics, regional nerve blocks, and anticonvulsants have little benefit in most cases when used alone. Other tricyclic anti-depressants are used as a second option if the first antidepressant is unsuccessful.  Anticonvulsants, including carbamazepine, have been found to provide some added benefit when used with a tricyclic anti-depressant. This is especially for the stabbing type of pain similar to that of trigeminal neuralgia.  Chronic opioid therapy. This is a strong narcotic pain medication. It is used to treat pain that is resistant to other measures. The issues of dependency and tolerance can be reduced with closely managed care.  Some cream treatments are applied  locally to the affected area. They can help when used with other treatments. Their use may be difficult in the case of postherpetic trigeminal neuralgia. This is involved with the face. So the substances can irritate the eye and the skin around the eye. Examples of creams used include Capsaicin and lidocaine creams.  For shingles, antiviral therapies along with analgesics are recommended. Studies of the effect of anti-viral agents such as acyclovir on shingles have been done. They show improved rates of healing and decreased severity of sudden (acute) pain. Some observations suggest that nerve blocks during shingles infection will:  Reduce pain.  Shorten the acute episode.  Prevent the emergence of postherpetic neuralgia. Viral medications used include Acyclovir (Zovirax), Valacyclovir, Famciclovir and a lysine diet. Document Released: 11/03/2002 Document Revised: 11/05/2011 Document Reviewed: 08/13/2005 [facility] Patient Information 2014 [place], [place].

## 2013-04-08 NOTE — Progress Notes (Signed)
  Subjective:    Patient ID: Kathleen [name], female    DOB: 1961-01-13, 53 y.o.   MRN: [id number]  Sore Throat  This is a new problem. The current episode started in the past 7 days.   Patient relates postnasal drip sore throat cough denies wheeze denies nausea vomiting diarrhea. Denies headaches. She is under a lot of stress she's very anxious. She finds her felt himself feeling depressed. She states she doesn't sleep well. She has post herpetic neuralgia in the left leg despite multiple medications. In addition to this she also has severe sarcoidosis for which she is under a specialist care. Has chronic shortness of breath. Unable to work currently. 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 PMH benign see above  Review of Systems See above    Objective:   Physical Exam Eardrums normal throat is normal neck is supple mild sinus tenderness lungs clear heart regular       Assessment & Plan:  Sinusitis-antibiotics prescribed as directed if doesn't get better let us know Postherpetic neuralgia slightly better than what it was but still causing problems Sarcoid followed by specialists Followup in September as previously discussed patient is disabled

## 2013-04-10 ENCOUNTER — Encounter (HOSPITAL_COMMUNITY): Payer: 59

## 2013-04-13 ENCOUNTER — Encounter (HOSPITAL_COMMUNITY): Payer: 59

## 2013-04-15 ENCOUNTER — Encounter (HOSPITAL_COMMUNITY): Payer: 59

## 2013-04-15 ENCOUNTER — Other Ambulatory Visit: Payer: Self-pay | Admitting: Gastroenterology

## 2013-04-16 ENCOUNTER — Ambulatory Visit
Admission: RE | Admit: 2013-04-16 | Discharge: 2013-04-16 | Disposition: A | Discharge status: Home / Self Care | Payer: 59 | Source: Ambulatory Visit | Attending: Obstetrics and Gynecology | Admitting: Obstetrics and Gynecology

## 2013-04-16 DIAGNOSIS — Z1231 Encounter for screening mammogram for malignant neoplasm of breast: Secondary | ICD-10-CM

## 2013-04-17 ENCOUNTER — Encounter (HOSPITAL_COMMUNITY): Payer: 59

## 2013-04-20 ENCOUNTER — Encounter (HOSPITAL_COMMUNITY): Payer: 59

## 2013-05-01 DIAGNOSIS — Z0289 Encounter for other administrative examinations: Secondary | ICD-10-CM

## 2013-05-05 ENCOUNTER — Other Ambulatory Visit: Payer: Self-pay | Admitting: Obstetrics and Gynecology

## 2013-05-05 DIAGNOSIS — Z803 Family history of malignant neoplasm of breast: Secondary | ICD-10-CM

## 2013-05-06 ENCOUNTER — Ambulatory Visit (INDEPENDENT_AMBULATORY_CARE_PROVIDER_SITE_OTHER): Payer: 59 | Admitting: Family Medicine

## 2013-05-06 ENCOUNTER — Encounter: Payer: Self-pay | Admitting: Family Medicine

## 2013-05-06 VITALS — BP 130/78 | Ht 69.0 in | Wt 218.8 lb

## 2013-05-06 DIAGNOSIS — IMO0001 Reserved for inherently not codable concepts without codable children: Secondary | ICD-10-CM

## 2013-05-06 DIAGNOSIS — R339 Retention of urine, unspecified: Secondary | ICD-10-CM

## 2013-05-06 DIAGNOSIS — E669 Obesity, unspecified: Secondary | ICD-10-CM

## 2013-05-06 DIAGNOSIS — M797 Fibromyalgia: Secondary | ICD-10-CM

## 2013-05-06 DIAGNOSIS — D869 Sarcoidosis, unspecified: Secondary | ICD-10-CM

## 2013-05-06 MED ORDER — PREGABALIN 150 MG PO CAPS: 150.0000 mg | ORAL_CAPSULE | Freq: Three times a day (TID) | ORAL | Status: DC

## 2013-05-06 NOTE — Progress Notes (Signed)
  Subjective:    Patient ID: Kathleen [name], female    DOB: 1960/10/13, 52 y.o.   MRN: [id number]  HPI Patient arrive to discuss ongoing problems with a chronic sore throat. Pt frustrated about her severe disease and the lack of getting well Severe dow with all activity, sob at rest Also with severe fibromyalgia Pt having hard time adjusting to poor outlook Hx fibro and sarcoidosis Review of Systems  Constitutional: Positive for activity change (poor) and fatigue (severe).  HENT: Negative for congestion and rhinorrhea.   Respiratory: Positive for cough, shortness of breath and wheezing.   Cardiovascular: Positive for chest pain (sharp).  Gastrointestinal: Negative for abdominal pain and abdominal distention.  Neurological: Positive for dizziness, tremors, syncope and numbness.  Psychiatric/Behavioral: Positive for dysphoric mood (related to feeling bad from her illness).       Objective:   Physical Exam  Vitals reviewed. Constitutional: She is oriented to person, place, and time. She appears well-developed and well-nourished.  HENT:  Head: Normocephalic and atraumatic.  Right Ear: External ear normal.  Left Ear: External ear normal.  Neck: Normal range of motion.  Cardiovascular: Normal rate, regular rhythm and normal heart sounds.   No murmur heard. Pulmonary/Chest: Effort normal. She has wheezes (scattered). She has no rales. She exhibits tenderness (from coughing).  Abdominal: Soft. She exhibits no distension. There is no tenderness. There is no rebound.  Musculoskeletal: Normal range of motion. She exhibits tenderness. She exhibits no edema.  Lymphadenopathy:    She has no cervical adenopathy.  Neurological: She is alert and oriented to person, place, and time.  Skin: Skin is warm and dry.  Psychiatric: Her behavior is normal. Thought content normal.          Assessment & Plan:  Severe disabled-with her severe lung disease there is no way she is ever going to be able  to work. This patient may easily end up with a lung transplant in a few years time. She is being followed by [facility] specialist. Patient having difficult time grasping that her condition is sadly [place] and worsening Urinary retention-she still has to use in and out catheterization several times per day. Hopefully this will get somewhat better. This is related to damage done by the shingles. Post shingles pain-we will try increasing her Lyrica 150 mg 3 times a day to see if that help her she ought to followup in 4-6 weeks we can discuss this further. She is permanently disabled

## 2013-05-07 ENCOUNTER — Other Ambulatory Visit: Payer: Self-pay | Admitting: Family Medicine

## 2013-05-07 ENCOUNTER — Other Ambulatory Visit: Payer: Self-pay

## 2013-05-08 NOTE — Telephone Encounter (Signed)
Okay x4 

## 2013-05-11 ENCOUNTER — Other Ambulatory Visit: Payer: Self-pay | Admitting: *Deleted

## 2013-05-13 NOTE — Progress Notes (Signed)
This encounter was created in error - please disregard.

## 2013-05-18 ENCOUNTER — Ambulatory Visit: Payer: 59 | Admitting: Family Medicine

## 2013-05-18 ENCOUNTER — Ambulatory Visit: Payer: Self-pay | Admitting: Family Medicine

## 2013-05-22 ENCOUNTER — Ambulatory Visit (INDEPENDENT_AMBULATORY_CARE_PROVIDER_SITE_OTHER): Payer: 59 | Admitting: Urology

## 2013-05-22 DIAGNOSIS — N312 Flaccid neuropathic bladder, not elsewhere classified: Secondary | ICD-10-CM

## 2013-05-22 DIAGNOSIS — R338 Other retention of urine: Secondary | ICD-10-CM

## 2013-05-25 ENCOUNTER — Encounter: Payer: Self-pay | Admitting: Family Medicine

## 2013-05-25 ENCOUNTER — Ambulatory Visit (INDEPENDENT_AMBULATORY_CARE_PROVIDER_SITE_OTHER): Payer: 59 | Admitting: Family Medicine

## 2013-05-25 VITALS — BP 120/84 | Ht 70.0 in | Wt 215.0 lb

## 2013-05-25 DIAGNOSIS — R51 Headache: Secondary | ICD-10-CM

## 2013-05-25 DIAGNOSIS — B029 Zoster without complications: Secondary | ICD-10-CM

## 2013-05-25 DIAGNOSIS — F411 Generalized anxiety disorder: Secondary | ICD-10-CM

## 2013-05-25 DIAGNOSIS — R339 Retention of urine, unspecified: Secondary | ICD-10-CM

## 2013-05-25 MED ORDER — OXYCODONE-ACETAMINOPHEN 5-325 MG PO TABS: 1.0000 | ORAL_TABLET | ORAL | Status: DC | PRN

## 2013-05-25 NOTE — Progress Notes (Signed)
  Subjective:    Patient ID: Kathleen [name], female    DOB: [birth year], 52 y.o.   MRN: [id number]  HPI Patient states that she has a knot on the back of her head and it is causing her to have headaches. She states that she has not had an injury to her head that she knows of. It has been present for about 3 days now.   She states that she is still having pain from her shingles and she may need a refill on her oxycodone.  She states feeling uses oxycodone one to 2 times a day she denies abusing it.  She is somewhat confused about her Xanax she has 2 different strengths one at bedtime to help her rest not when during the day for anxiety I told her it would be best to be on just one medication she needs to come in on her next visit bring all her medicines so that we can discuss this.  Review of Systems    no vomiting no fevers no blurred visio Objective:   Physical Exam Bilateral related to having fibromyalgia.  She has a small bump on the back of the head consistent with a sebaceous cyst versus a small lymph node no intervention necessary currently lungs stable heart regular    Assessment & Plan:  Bladder issue-urinary retention seems to be doing fair. Seen urologist.  Shingles pain-Lyrica 150 mg she isn't taking it properly she needs to get 3 times a day oxycodone sparingly  Sebaceuos knot small spot on the scalp has not been causing time series from watch

## 2013-05-25 NOTE — Patient Instructions (Addendum)
Lyrica 150 mg: take one three times a day  Percocet use sparingly for pain try to limit to one or two per day  Bring your medicines to your next visit  We will set you up a nutritionist consult

## 2013-06-10 ENCOUNTER — Ambulatory Visit (INDEPENDENT_AMBULATORY_CARE_PROVIDER_SITE_OTHER): Payer: 59 | Admitting: Family Medicine

## 2013-06-10 ENCOUNTER — Encounter: Payer: Self-pay | Admitting: Family Medicine

## 2013-06-10 VITALS — BP 124/76 | Ht 70.0 in | Wt 211.0 lb

## 2013-06-10 DIAGNOSIS — Z23 Encounter for immunization: Secondary | ICD-10-CM

## 2013-06-10 DIAGNOSIS — Z79899 Other long term (current) drug therapy: Secondary | ICD-10-CM

## 2013-06-10 DIAGNOSIS — E785 Hyperlipidemia, unspecified: Secondary | ICD-10-CM

## 2013-06-10 MED ORDER — ALPRAZOLAM 1 MG PO TABS: ORAL_TABLET | ORAL | Status: DC

## 2013-06-10 MED ORDER — GABAPENTIN 300 MG PO CAPS: 300.0000 mg | ORAL_CAPSULE | Freq: Three times a day (TID) | ORAL | Status: DC

## 2013-06-10 NOTE — Progress Notes (Signed)
  Subjective:    Patient ID: Kathleen [name], female    DOB: 1961-07-19, 52 y.o.   MRN: [id number]  HPIHere for a med check.   Clarify xanax. She takes 0.5 mg and 1mg . the patient somehow has started using 0.5 mg during the day for anxiety and 1 mg at night for sleep consolidate this medication so she is not getting 2 prescriptions filled. This should be cheaper for her. Lyrica is too expensive. Wants to try something cheaper. I talked with her at length about how Neurontin has been out for years and was forerunner to Lyrica. She understands this after significant discussion is willing to try to see Annette Stable help save her money.  Pt declines flu vaccine. 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 Patient also has fibromyalgia as well and suffers with depression as well Family history noncontributory  Review of Systems    see above Objective:   Physical Exam  Lungs are clear hearts regular pulse normal abdomen soft subjective discomfort in the left hip left leg Patient got very winded just walking into our office     Assessment & Plan:  Sarcoidosis followup with specialist ask him about flu vaccine Shingles neuropathy slightly better try Neurontin see if that will save her some money 300 mg re\re times daily stopped Lyrica Anxiety issues 1 mg Xanax at night as needed for sleep half of a tablet during the daytime as needed for anxiety Long discussion held with the patient regarding these various issues 25 minutes spent with patient

## 2013-06-29 LAB — BASIC METABOLIC PANEL
BUN: 15 mg/dL (ref 6–23)
CO2: 23 mEq/L (ref 19–32)
Calcium: 9.6 mg/dL (ref 8.4–10.5)
Chloride: 105 mEq/L (ref 96–112)
Creat: 0.78 mg/dL (ref 0.50–1.10)
Glucose, Bld: 67 mg/dL — ABNORMAL LOW (ref 70–99)
Potassium: 4.3 mEq/L (ref 3.5–5.3)
Sodium: 140 mEq/L (ref 135–145)

## 2013-06-29 LAB — LIPID PANEL
Cholesterol: 209 mg/dL — ABNORMAL HIGH (ref 0–200)
HDL: 53 mg/dL (ref >39)
LDL Cholesterol: 123 mg/dL — ABNORMAL HIGH (ref 0–99)
Total CHOL/HDL Ratio: 3.9 Ratio
Triglycerides: 166 mg/dL — ABNORMAL HIGH (ref <150)
VLDL: 33 mg/dL (ref 0–40)

## 2013-07-02 ENCOUNTER — Encounter: Payer: Self-pay | Admitting: Family Medicine

## 2013-07-02 ENCOUNTER — Other Ambulatory Visit: Payer: Self-pay

## 2013-07-15 DIAGNOSIS — Z0289 Encounter for other administrative examinations: Secondary | ICD-10-CM

## 2013-07-27 ENCOUNTER — Encounter: Payer: Self-pay | Admitting: Family Medicine

## 2013-07-27 ENCOUNTER — Ambulatory Visit (INDEPENDENT_AMBULATORY_CARE_PROVIDER_SITE_OTHER): Payer: 59 | Admitting: Family Medicine

## 2013-07-27 VITALS — BP 118/78 | Temp 98.8°F | Ht 70.0 in | Wt 210.4 lb

## 2013-07-27 DIAGNOSIS — J019 Acute sinusitis, unspecified: Secondary | ICD-10-CM

## 2013-07-27 MED ORDER — AMOXICILLIN 500 MG PO TABS: 500.0000 mg | ORAL_TABLET | Freq: Three times a day (TID) | ORAL | Status: DC

## 2013-07-27 MED ORDER — PANTOPRAZOLE SODIUM 40 MG PO TBEC: 40.0000 mg | DELAYED_RELEASE_TABLET | Freq: Every day | ORAL | Status: DC

## 2013-07-27 MED ORDER — CITALOPRAM HYDROBROMIDE 40 MG PO TABS: 40.0000 mg | ORAL_TABLET | Freq: Every day | ORAL | Status: DC

## 2013-07-27 NOTE — Progress Notes (Signed)
   Subjective:    Patient ID: Kathleen [name], female    DOB: Dec 29, 1960, 52 y.o.   MRN: [id number]  Sore Throat  This is a new problem. The current episode started in the past 7 days. The problem has been gradually worsening. Neither side of throat is experiencing more pain than the other. There has been no fever. The pain is at a severity of 3/10. The pain is mild. Associated symptoms include congestion and coughing. Pertinent negatives include no ear pain or shortness of breath.    PMH patient has disability do to severe sarcoidosis  Review of Systems  Constitutional: Negative for fever and activity change.  HENT: Positive for congestion and rhinorrhea. Negative for ear pain.   Eyes: Negative for discharge.  Respiratory: Positive for cough. Negative for shortness of breath and wheezing.   Cardiovascular: Negative for chest pain.       Objective:   Physical Exam  Nursing note and vitals reviewed. Constitutional: She appears well-developed.  HENT:  Head: Normocephalic.  Nose: Nose normal.  Mouth/Throat: Oropharynx is clear and moist. No oropharyngeal exudate.  Neck: Neck supple.  Cardiovascular: Normal rate and normal heart sounds.   No murmur heard. Pulmonary/Chest: Effort normal and breath sounds normal. She has no wheezes.  Lymphadenopathy:    She has no cervical adenopathy.  Skin: Skin is warm and dry.          Assessment & Plan:  Sarcoid her specialists stable but severe or unrelenting disabled. URI antibiotics prescribed should gradually get better probable sinus Followup if ongoing troubles Moderate depression increase Celexa from 20 mg to 40 mg patient to followup in a few weeks time to see how this is doing. It is been of severe struggle for her because of her sarcoid. She is not suicidal.

## 2013-07-28 NOTE — Progress Notes (Signed)
Cardiac Rehabilitation Program Outcomes Report   Orientation:  01/01/2013 Graduate Date:  NA Discharge Date:  03/20/2013 # of sessions completed: 6 DX: Sarcodosis, ILD  Pulmonologist: Baldo Ash Family MD:  [name] Class Time:  13:00  A.  Exercise Program:  Tolerates exercise @ 3.83 METS for 15 minutes, Walk Test Results:  Pre: Pre walk Test performed at [facility] records were not transfered per request. Only attended 6 sessions. Marland Kitchen and Discharged  B.  Mental Health:  Good mental attitude  C.  Education/Instruction/Skills  Accurately checks own pulse.  Rest:  82  Exercise: 102, Knows THR for exercise, Uses Perceived Exertion Scale and/or Dyspnea Scale and Attended 6 education classes  Demonstrates accurate pursed lip breathing  D.  Nutrition/Weight Control/Body Composition:  Adherence to prescribed nutrition program: good    E.  Blood Lipids    Lab Results  Component Value Date   CHOL 209* 06/29/2013   HDL 53 06/29/2013   LDLCALC 123* 06/29/2013   TRIG 166* 06/29/2013   CHOLHDL 3.9 06/29/2013    F.  Lifestyle Changes:  Making positive lifestyle changes  G.  Symptoms noted with exercise:  Asymptomatic  Report Completed By:  Lelon Huh. Cherrelle Plante RN   Comments:  This is patients discharge report . She only attended 6 sessions. She did well while in rehab. Her resting HR was 82 and resting BP was 100/70, Her peak HR was 102 and her Peak BP was 100/70.

## 2013-07-28 NOTE — Addendum Note (Signed)
Encounter addended by: Angelica Pou, RN on: 07/28/2013  8:11 AM<BR>     Documentation filed: Clinical Notes

## 2013-07-28 NOTE — Addendum Note (Signed)
Encounter addended by: Angelica Pou, RN on: 07/28/2013  8:09 AM<BR>     Documentation filed: Notes Section

## 2013-08-07 ENCOUNTER — Ambulatory Visit: Payer: Self-pay | Admitting: Urology

## 2013-08-18 ENCOUNTER — Ambulatory Visit (INDEPENDENT_AMBULATORY_CARE_PROVIDER_SITE_OTHER): Payer: 59 | Admitting: Family Medicine

## 2013-08-18 ENCOUNTER — Encounter: Payer: Self-pay | Admitting: Family Medicine

## 2013-08-18 VITALS — BP 110/70 | Ht 70.0 in | Wt 212.1 lb

## 2013-08-18 DIAGNOSIS — R0989 Other specified symptoms and signs involving the circulatory and respiratory systems: Secondary | ICD-10-CM

## 2013-08-18 DIAGNOSIS — F3289 Other specified depressive episodes: Secondary | ICD-10-CM

## 2013-08-18 DIAGNOSIS — F411 Generalized anxiety disorder: Secondary | ICD-10-CM

## 2013-08-18 DIAGNOSIS — M79609 Pain in unspecified limb: Secondary | ICD-10-CM

## 2013-08-18 DIAGNOSIS — R0609 Other forms of dyspnea: Secondary | ICD-10-CM

## 2013-08-18 DIAGNOSIS — IMO0001 Reserved for inherently not codable concepts without codable children: Secondary | ICD-10-CM

## 2013-08-18 DIAGNOSIS — F32A Depression, unspecified: Secondary | ICD-10-CM

## 2013-08-18 DIAGNOSIS — F329 Major depressive disorder, single episode, unspecified: Secondary | ICD-10-CM

## 2013-08-18 DIAGNOSIS — R0600 Dyspnea, unspecified: Secondary | ICD-10-CM

## 2013-08-18 DIAGNOSIS — M797 Fibromyalgia: Secondary | ICD-10-CM

## 2013-08-18 DIAGNOSIS — R339 Retention of urine, unspecified: Secondary | ICD-10-CM

## 2013-08-18 DIAGNOSIS — D869 Sarcoidosis, unspecified: Secondary | ICD-10-CM

## 2013-08-18 MED ORDER — CEFPROZIL 500 MG PO TABS: 500.0000 mg | ORAL_TABLET | Freq: Two times a day (BID) | ORAL | Status: DC

## 2013-08-18 NOTE — Progress Notes (Signed)
   Subjective:    Patient ID: Kathleen [name], female    DOB: April 04, 1961, 52 y.o.   MRN: [id number]  HPI Patient is here today for follow up visit on depression. Celexa was increased to 40 mg daily. Patient states that the medication is helping her symptoms.  Long discussion held regarding her sarcoidosis shortness of breath with minimal activity she also at times gets depressed about anxious about it. She relates some coughing and congestion but no fevers no wheezing she also has burning pain down the left leg from previous shingles. Complains of congestion, runny nose and sore throat for several days now.    Review of Systems  Constitutional: Negative for fever and activity change.  HENT: Positive for congestion and rhinorrhea. Negative for ear pain.   Eyes: Negative for discharge.  Respiratory: Positive for cough. Negative for shortness of breath and wheezing.   Cardiovascular: Negative for chest pain.       Objective:   Physical Exam  Nursing note and vitals reviewed. Constitutional: She appears well-developed.  HENT:  Head: Normocephalic.  Nose: Nose normal.  Mouth/Throat: Oropharynx is clear and moist. No oropharyngeal exudate.  Neck: Neck supple.  Cardiovascular: Normal rate and normal heart sounds.   No murmur heard. Pulmonary/Chest: Effort normal and breath sounds normal. She has no wheezes.  Lymphadenopathy:    She has no cervical adenopathy.  Skin: Skin is warm and dry.          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  Sarcoidosis-patient is disabled with this. I will never get better. The best hope is that he will stabilize. She sees her specialist.  Major depression-actually doing better on the higher dose to continue medication. Followup with [place] in 3-4 months  Anxiety-patient has a lot of anxiety about the future. We talked through various  issues I encouraged her to have a counselor  Bladder atony due to shingles-she does in and out catheterizations a couple times a day due to this problem  URI-viral process should gradually get better but if worsens get antibiotics filled.

## 2013-09-02 ENCOUNTER — Encounter: Payer: Self-pay | Admitting: Family Medicine

## 2013-09-02 ENCOUNTER — Ambulatory Visit (INDEPENDENT_AMBULATORY_CARE_PROVIDER_SITE_OTHER): Payer: 59 | Admitting: Family Medicine

## 2013-09-02 VITALS — BP 118/80 | Temp 98.5°F | Ht 70.0 in | Wt 209.0 lb

## 2013-09-02 DIAGNOSIS — J019 Acute sinusitis, unspecified: Secondary | ICD-10-CM

## 2013-09-02 DIAGNOSIS — R059 Cough, unspecified: Secondary | ICD-10-CM

## 2013-09-02 DIAGNOSIS — R05 Cough: Secondary | ICD-10-CM

## 2013-09-02 MED ORDER — CEFTRIAXONE SODIUM 1 G IJ SOLR
1.0000 g | Freq: Once | INTRAMUSCULAR | Status: AC
  Administered 2013-09-02: 1 g via INTRAMUSCULAR

## 2013-09-02 MED ORDER — CEFPROZIL 500 MG PO TABS: 500.0000 mg | ORAL_TABLET | Freq: Two times a day (BID) | ORAL | Status: DC

## 2013-09-02 MED ORDER — ALBUTEROL SULFATE HFA 108 (90 BASE) MCG/ACT IN AERS: 2.0000 | INHALATION_SPRAY | Freq: Four times a day (QID) | RESPIRATORY_TRACT | Status: DC | PRN

## 2013-09-02 NOTE — Progress Notes (Signed)
   Subjective:    Patient ID: Kathleen [name], female    DOB: Sep 20, 1960, 53 y.o.   MRN: [id number]  Cough This is a new problem. The current episode started in the past 7 days. Associated symptoms include myalgias and a sore throat. Associated symptoms comments: Weakness, congestion.   she has underlying problems with interstitial lung disease-sarcoidosis, she is disabled    Review of Systems  HENT: Positive for sore throat.   Respiratory: Positive for cough.   Musculoskeletal: Positive for myalgias.   she denies any high fever headaches vomiting diarrhea     Objective:   Physical Exam Her lungs are clear she does have a cough neck is supple sinus mild tenderness eardrums normal skin warm dry patient not toxic       Assessment & Plan:  Viral syndrome with secondary sinusitis antibiotics prescribed. Cefzil 10 days if progressive troubles or if worse followup warning signs discussed

## 2013-09-22 ENCOUNTER — Other Ambulatory Visit: Payer: Self-pay | Admitting: Family Medicine

## 2013-09-22 NOTE — Telephone Encounter (Signed)
May give this +3 refills 

## 2013-09-25 ENCOUNTER — Ambulatory Visit (INDEPENDENT_AMBULATORY_CARE_PROVIDER_SITE_OTHER): Payer: 59 | Admitting: Family Medicine

## 2013-09-25 ENCOUNTER — Encounter: Payer: Self-pay | Admitting: Family Medicine

## 2013-09-25 VITALS — BP 122/78 | Temp 98.6°F | Ht 70.0 in | Wt 212.4 lb

## 2013-09-25 DIAGNOSIS — R0989 Other specified symptoms and signs involving the circulatory and respiratory systems: Secondary | ICD-10-CM

## 2013-09-25 DIAGNOSIS — D869 Sarcoidosis, unspecified: Secondary | ICD-10-CM

## 2013-09-25 DIAGNOSIS — R0600 Dyspnea, unspecified: Secondary | ICD-10-CM

## 2013-09-25 DIAGNOSIS — R0609 Other forms of dyspnea: Secondary | ICD-10-CM

## 2013-09-25 MED ORDER — BENZONATATE 100 MG PO CAPS: 200.0000 mg | ORAL_CAPSULE | Freq: Three times a day (TID) | ORAL | Status: DC | PRN

## 2013-09-25 MED ORDER — CHLORZOXAZONE 500 MG PO TABS: 500.0000 mg | ORAL_TABLET | Freq: Three times a day (TID) | ORAL | Status: DC | PRN

## 2013-09-25 MED ORDER — METHYLPREDNISOLONE ACETATE 40 MG/ML IJ SUSP
40.0000 mg | Freq: Once | INTRAMUSCULAR | Status: AC
  Administered 2013-09-25: 40 mg via INTRAMUSCULAR

## 2013-09-25 NOTE — Patient Instructions (Signed)
If high fevers or severe shortness of breath then you need to get rechecked right away here or ER  We will consult to try to get you help at home

## 2013-09-25 NOTE — Progress Notes (Signed)
   Subjective:    Patient ID: Kathleen [name], female    DOB: Dec 15, 1960, 53 y.o.   MRN: [id number]  Cough This is a new problem. The current episode started 1 to 4 weeks ago. Associated symptoms include wheezing.   she relates coughing congestion she denies head pain she was just in [place] for couple days PMH sarcoid   Review of Systems  Respiratory: Positive for cough and wheezing.        Objective:   Physical Exam  Bilateral expiratory wheezes heart regular pulse normal neck no masses cough noted      Assessment & Plan:  Severe sarcoidosis given injection of steroids today continue steroid taper as [facility] pulmonology prescribed followup if progressive troubles recheck again in 2 weeks' time  This patient is disabled. She is on immunosuppressant met medicines. She has severe shortness of breath with minimal activity. There is no way she is ever to be able to hold a job.

## 2013-10-01 ENCOUNTER — Telehealth: Payer: Self-pay | Admitting: Family Medicine

## 2013-10-01 NOTE — Telephone Encounter (Signed)
See note below from [facility] w/[facility]  got your referral. Right now we are low on nursing and I will have to decline this patient for RN, however, I see he wants strengthening exercises... Do you think he would order PT because we aren't low on PTs? We could start her soon and then maybe add nursing later on if he still wants it...   What do you think?   Please advise and I will notify Terrence Dupont

## 2013-10-02 NOTE — Telephone Encounter (Signed)
What is difficult about this patient's situation is she is feeling like she is getting to the point where she is having a hard time completing her activities of daily living in the house. My gut feeling sad she would benefit more from having someone to come assist her couple times a week. I really don't think physical therapy would necessarily help her. Possibly [facility] would have some options they could suggest for the patient to get some qualified help. It is possible that the patient just doesn't qualify for this currently.

## 2013-10-02 NOTE — Telephone Encounter (Signed)
Terrence Dupont, see Dr. Bary Leriche note below.  I really don't know what to do from here. THANKS

## 2013-10-06 NOTE — Telephone Encounter (Signed)
Per Terrence Dupont w/[facility] - If this patient just wants someone to come out and help around the house I tell them to call [facility]... [phone] they send people out to help caregiving  Please advise

## 2013-10-06 NOTE — Telephone Encounter (Signed)
Please discuss this with the patient how currently home health really has nothing to offer her but they do have some leads on people to call to help with home care. Part of the drawback is this patient probably does need help but am not sure she can afford to pay someone for the help. I think it is worthwhile to call her and let her know regarding this and give her the name and phone number of this place in it she would like to connect with them to see if they can come help her that would be fine.

## 2013-10-09 ENCOUNTER — Encounter: Payer: Self-pay | Admitting: Family Medicine

## 2013-10-09 ENCOUNTER — Ambulatory Visit (INDEPENDENT_AMBULATORY_CARE_PROVIDER_SITE_OTHER): Payer: 59 | Admitting: Family Medicine

## 2013-10-09 VITALS — BP 118/80 | Ht 70.0 in | Wt 206.0 lb

## 2013-10-09 DIAGNOSIS — D869 Sarcoidosis, unspecified: Secondary | ICD-10-CM

## 2013-10-09 DIAGNOSIS — B029 Zoster without complications: Secondary | ICD-10-CM

## 2013-10-09 DIAGNOSIS — R05 Cough: Secondary | ICD-10-CM

## 2013-10-09 DIAGNOSIS — R059 Cough, unspecified: Secondary | ICD-10-CM

## 2013-10-09 MED ORDER — OXYCODONE-ACETAMINOPHEN 5-325 MG PO TABS: 1.0000 | ORAL_TABLET | ORAL | Status: DC | PRN

## 2013-10-09 MED ORDER — VALACYCLOVIR HCL 1 G PO TABS: 1000.0000 mg | ORAL_TABLET | Freq: Three times a day (TID) | ORAL | Status: AC

## 2013-10-09 NOTE — Progress Notes (Signed)
Subjective:    Patient ID: Kathleen [name], female    DOB: November 23, 1960, 53 y.o.   MRN: [id number]  HPI Patient is here today for a 2 week f/u on sarcoidosis.   She went to [facility] pulmonology. She said the visit went well.  She is feeling overwhelmed. Below are some parameters of her physical capabilities currently Carries 5 lbs briefly Sit for 5 to 10 minutes then has to change Stand 5 to 10 only at a time Walk 200 feet then SOB/DOE Can't do flight of steps Bending difficult due to fibromyalgia and hip pain If gets down on knees then can't get up without help Local driving only Can work with hands briefly but hands go numb and she drops items     Review of Systems  Constitutional: Positive for fatigue. Negative for activity change and appetite change.  HENT: Negative for congestion, ear discharge and rhinorrhea.   Eyes: Negative for discharge.  Respiratory: Positive for shortness of breath. Negative for cough, chest tightness and wheezing.   Cardiovascular: Positive for chest pain. Negative for leg swelling.       She describes intermittent sharp pains in her chest when she takes a deep breath  Gastrointestinal: Negative for vomiting and abdominal pain.  Genitourinary: Positive for frequency. Negative for difficulty urinating.  Musculoskeletal: Positive for back pain and myalgias. Negative for neck pain.  Allergic/Immunologic: Negative for environmental allergies and food allergies.  Neurological: Negative for weakness and headaches.  Psychiatric/Behavioral: Positive for sleep disturbance and dysphoric mood. Negative for behavioral problems and agitation. The patient is nervous/anxious.        Objective:   Physical Exam  Constitutional: She is oriented to person, place, and time. She appears well-developed and well-nourished.  HENT:  Head: Normocephalic.  Right Ear: External ear normal.  Left Ear: External ear normal.  Eyes: Pupils are equal, round, and reactive to light.    Neck: Normal range of motion. No thyromegaly present.  Cardiovascular: Normal rate, regular rhythm, normal heart sounds and intact distal pulses.   No murmur heard. Pulmonary/Chest: Effort normal and breath sounds normal. No respiratory distress. She has no wheezes.  Patient has some shortness of breath which is sitting but not severe She certainly gets severe shortness of breath with walking  Abdominal: Soft. Bowel sounds are normal. She exhibits no distension and no mass. There is no tenderness.  Musculoskeletal: Normal range of motion. She exhibits no edema and no tenderness.  Lymphadenopathy:    She has no cervical adenopathy.  Neurological: She is alert and oriented to person, place, and time. She exhibits normal muscle tone.  Skin: Skin is warm and dry.  She has several scarred areas from previous shingles but she also has some spots that could be new lesions. Culture was taken.  Psychiatric: She has a normal mood and affect. Her behavior is normal.          Assessment & Plan:  #1-possible return of shingles Valtrex 1 g 3 times a day for 7 days. Culture taken. #2 severe sarcoid continue prednisone I do not hear any pneumonia in her lungs she continues to have significant shortness of breath #3 depression-patient has a lot of anxiety issues she will be getting in with a counselor should continue her antidepressant and nerve medication I sympathize with her because a lot of this has dramatically changed her life for the worse. She denies being suicidal #4 fibromyalgia generalized body aches she may use Tylenol as necessary  #5  urinary retention she continues to have use in and out catheters 2-3 times a day she also has loss of bladder control at nighttime using pull-ups currently  #6 lumbar pain with sciatic pain down the right leg related to shingles I recommend oxycodone as necessary for the pain cautioned drowsiness #7 this patient is disabled is appealing her disability.  25  minutes was spent with the patient.

## 2013-10-16 ENCOUNTER — Telehealth: Payer: Self-pay | Admitting: Family Medicine

## 2013-10-16 NOTE — Telephone Encounter (Signed)
Patient called wanting to know the results of her shingles test.

## 2013-10-16 NOTE — Telephone Encounter (Signed)
Results not ready per lab

## 2013-10-19 DIAGNOSIS — Z0289 Encounter for other administrative examinations: Secondary | ICD-10-CM

## 2013-10-19 LAB — VIRAL CULTURE VIRC: Organism ID, Bacteria: NEGATIVE

## 2013-10-20 ENCOUNTER — Ambulatory Visit (HOSPITAL_COMMUNITY): Payer: Self-pay | Admitting: Psychiatry

## 2013-10-20 ENCOUNTER — Encounter: Payer: Self-pay | Admitting: Family Medicine

## 2013-10-21 DIAGNOSIS — Z0289 Encounter for other administrative examinations: Secondary | ICD-10-CM

## 2013-10-30 ENCOUNTER — Encounter: Payer: Self-pay | Admitting: Family Medicine

## 2013-10-30 ENCOUNTER — Ambulatory Visit (INDEPENDENT_AMBULATORY_CARE_PROVIDER_SITE_OTHER): Payer: 59 | Admitting: Family Medicine

## 2013-10-30 VITALS — BP 112/68 | Ht 70.0 in | Wt 210.0 lb

## 2013-10-30 DIAGNOSIS — IMO0001 Reserved for inherently not codable concepts without codable children: Secondary | ICD-10-CM

## 2013-10-30 DIAGNOSIS — D869 Sarcoidosis, unspecified: Secondary | ICD-10-CM

## 2013-10-30 DIAGNOSIS — M797 Fibromyalgia: Secondary | ICD-10-CM

## 2013-10-30 NOTE — Progress Notes (Signed)
   Subjective:    Patient ID: Kathleen [name], female    DOB: 01-31-1961, 53 y.o.   MRN: [id number]  HPIPatient here for a follow up. Patient states no concerns at this time.  She has severe sarcoidosis she follows with the specialist she is having significant emotional issues related to being stressed about having stage IV sarcoidosis   Review of Systems Negative for fevers vomiting diarrhea. Positive for cough shortness of breath intermittent chest pains    Objective:   Physical Exam  Lungs-no respiratory distress. Some intermittent crackles. No Rales. Patient is having frequent coughing Heart regular no murmurs Sinus nontender throat normal neck supple      Assessment & Plan:  Sarcoidosis unchanged. Very severe.  Upper respiratory illness if high fevers or if worse followup  Recheck patient in 3 months, patient is disabled.

## 2013-10-30 NOTE — Patient Instructions (Signed)
Follow up in 3 months  Tell us about any yard sales  Also try to find a hobby : writing/ poetry/ drawing / painting/ playing music/ other choices ?

## 2013-11-11 ENCOUNTER — Encounter (HOSPITAL_COMMUNITY): Payer: Self-pay | Admitting: Psychiatry

## 2013-11-11 ENCOUNTER — Ambulatory Visit (INDEPENDENT_AMBULATORY_CARE_PROVIDER_SITE_OTHER): Payer: 59 | Admitting: Psychiatry

## 2013-11-11 VITALS — BP 110/78 | Ht 70.0 in | Wt 204.0 lb

## 2013-11-11 DIAGNOSIS — F332 Major depressive disorder, recurrent severe without psychotic features: Secondary | ICD-10-CM

## 2013-11-11 DIAGNOSIS — F411 Generalized anxiety disorder: Secondary | ICD-10-CM

## 2013-11-11 MED ORDER — ALPRAZOLAM 1 MG PO TABS: ORAL_TABLET | ORAL | Status: DC

## 2013-11-11 MED ORDER — DULOXETINE HCL 60 MG PO CPEP: 60.0000 mg | ORAL_CAPSULE | Freq: Every day | ORAL | Status: DC
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
## 2013-11-25 ENCOUNTER — Encounter: Payer: Self-pay | Admitting: Family Medicine

## 2013-11-25 ENCOUNTER — Ambulatory Visit (INDEPENDENT_AMBULATORY_CARE_PROVIDER_SITE_OTHER): Payer: 59 | Admitting: Family Medicine

## 2013-11-25 VITALS — BP 112/72 | Temp 97.7°F | Ht 70.0 in | Wt 209.4 lb

## 2013-11-25 DIAGNOSIS — J209 Acute bronchitis, unspecified: Secondary | ICD-10-CM

## 2013-11-25 MED ORDER — CEFDINIR 300 MG PO CAPS: 300.0000 mg | ORAL_CAPSULE | Freq: Two times a day (BID) | ORAL | Status: DC

## 2013-11-25 NOTE — Progress Notes (Signed)
   Subjective:    Patient ID: Kathleen [name], female    DOB: [birth year], 53 y.o.   MRN: [id number]  Sore Throat  This is a new problem. The current episode started in the past 7 days. The problem has been unchanged. Neither side of throat is experiencing more pain than the other. There has been no fever. The pain is moderate. Associated symptoms include congestion and coughing. She has tried nothing for the symptoms. The treatment provided no relief.   Patient has no other concerns at this time.   Woke up with sore throat  Headache and cough  Non prod cough  No fever  pso drainage had flu and pneum shot   Review of Systems  HENT: Positive for congestion.   Respiratory: Positive for cough.    No vomiting no diarrhea no rash otherwise negative    Objective:   Physical Exam Alert moderate malaise. Nasal discharge. Plus minus frontal tenderness pharynx slight erythema neck supple. Lungs no true wheezes currently bronchial cough heart regular in rhythm.       Assessment & Plan:  Impression post viral sinusitis and bronchitis. Complicated by chronic immune suppression and sarcoidosis and fibromyalgia. Plan Omnicef twice a day 10 days. Increase nebulizer to 4 times a day. Warning signs discussed. WSL

## 2013-11-25 NOTE — Patient Instructions (Signed)
Use plain robitussin or mucinex as needed for cough

## 2013-12-09 ENCOUNTER — Ambulatory Visit (HOSPITAL_COMMUNITY): Payer: Self-pay | Admitting: Psychiatry

## 2013-12-09 ENCOUNTER — Encounter (HOSPITAL_COMMUNITY): Payer: Self-pay | Admitting: Psychiatry

## 2013-12-30 ENCOUNTER — Ambulatory Visit: Payer: 59 | Admitting: Family Medicine

## 2014-01-12 ENCOUNTER — Other Ambulatory Visit (HOSPITAL_COMMUNITY): Payer: Self-pay

## 2014-01-14 ENCOUNTER — Ambulatory Visit: Payer: 59 | Attending: Internal Medicine | Admitting: Sleep Medicine

## 2014-01-14 DIAGNOSIS — Z0289 Encounter for other administrative examinations: Secondary | ICD-10-CM

## 2014-01-14 DIAGNOSIS — G4733 Obstructive sleep apnea (adult) (pediatric): Secondary | ICD-10-CM | POA: Insufficient documentation

## 2014-01-14 DIAGNOSIS — Z79899 Other long term (current) drug therapy: Secondary | ICD-10-CM | POA: Insufficient documentation

## 2014-01-14 DIAGNOSIS — IMO0002 Reserved for concepts with insufficient information to code with codable children: Secondary | ICD-10-CM | POA: Insufficient documentation

## 2014-01-22 ENCOUNTER — Encounter: Payer: Self-pay | Admitting: Family Medicine

## 2014-01-22 ENCOUNTER — Ambulatory Visit (INDEPENDENT_AMBULATORY_CARE_PROVIDER_SITE_OTHER): Payer: 59 | Admitting: Family Medicine

## 2014-01-22 VITALS — BP 102/68 | Ht 70.0 in | Wt 212.0 lb

## 2014-01-22 DIAGNOSIS — R339 Retention of urine, unspecified: Secondary | ICD-10-CM

## 2014-01-22 DIAGNOSIS — IMO0001 Reserved for inherently not codable concepts without codable children: Secondary | ICD-10-CM

## 2014-01-22 DIAGNOSIS — F3289 Other specified depressive episodes: Secondary | ICD-10-CM

## 2014-01-22 DIAGNOSIS — F32A Depression, unspecified: Secondary | ICD-10-CM

## 2014-01-22 DIAGNOSIS — F329 Major depressive disorder, single episode, unspecified: Secondary | ICD-10-CM

## 2014-01-22 DIAGNOSIS — M797 Fibromyalgia: Secondary | ICD-10-CM

## 2014-01-22 DIAGNOSIS — D869 Sarcoidosis, unspecified: Secondary | ICD-10-CM

## 2014-01-22 MED ORDER — CHLORZOXAZONE 500 MG PO TABS: 500.0000 mg | ORAL_TABLET | Freq: Three times a day (TID) | ORAL | Status: DC | PRN

## 2014-01-22 MED ORDER — OXYCODONE-ACETAMINOPHEN 5-325 MG PO TABS: 1.0000 | ORAL_TABLET | ORAL | Status: DC | PRN

## 2014-01-22 NOTE — Progress Notes (Signed)
   Subjective:    Patient ID: Kathleen [name], female    DOB: May 01, 1961, 53 y.o.   MRN: [id number]  HPIDepression.   Needs refill on oxycodone and muscle relaxer.   Saw lung specialist yesterday. Dr Farley Ly. Some hobby activity    Review of Systems  Constitutional: Negative for fever and activity change.  HENT: Positive for congestion and rhinorrhea. Negative for ear pain.   Eyes: Negative for discharge.  Respiratory: Positive for cough and shortness of breath. Negative for wheezing.   Cardiovascular: Negative for chest pain.  Musculoskeletal: Positive for arthralgias and myalgias.       Objective:   Physical Exam  Lungs are clear hearts regular she does have decreased respiratory because of the sarcoidosis she also states soreness in her muscles and soreness in her joints she has a flat affect. Her neck has no masses extremities no edema skin warm dry.      Assessment & Plan:  1. Fibromyalgia Her fibromyalgia is still debilitating. She takes her medication but she suffers with a lot of pain and discomfort T.  2. Urinary retention Patient asked to do in and out cath at least 4 times a day she does not have any urinary tract infection symptoms recently. This urinary retention was due to nerve damage from severe case of shingles  3. Sarcoidosis She has stable sarcoidosis but she only has 60% lung function. I spoke with her pulmonologist yesterday who didn't feel that she could come off prednisone we will gradually titrate that down she will reduce it down to a half of a tablet which is 5 mg daily she will followup in one month at that point in time we'll reduce it down to 2-1/2 mg daily. The goal will be to get her completely off prednisone over the next couple months  4. Depression I am concerned about her depression she is instructed to followup with behavioral health she was also instructed to try to get active in some hobby use in family activities even if she is disabled she  should still engage herself in activity to avoid worsening depression patient not suicidal

## 2014-01-24 ENCOUNTER — Emergency Department (HOSPITAL_COMMUNITY)
Admission: EM | Admit: 2014-01-24 | Discharge: 2014-01-24 | Disposition: A | Discharge status: Home / Self Care | Payer: 59 | Attending: Emergency Medicine | Admitting: Emergency Medicine

## 2014-01-24 ENCOUNTER — Encounter (HOSPITAL_COMMUNITY): Payer: Self-pay | Admitting: Emergency Medicine

## 2014-01-24 ENCOUNTER — Emergency Department (HOSPITAL_COMMUNITY): Payer: 59

## 2014-01-24 DIAGNOSIS — R072 Precordial pain: Secondary | ICD-10-CM | POA: Insufficient documentation

## 2014-01-24 DIAGNOSIS — IMO0002 Reserved for concepts with insufficient information to code with codable children: Secondary | ICD-10-CM | POA: Insufficient documentation

## 2014-01-24 DIAGNOSIS — Z8709 Personal history of other diseases of the respiratory system: Secondary | ICD-10-CM | POA: Insufficient documentation

## 2014-01-24 DIAGNOSIS — Z8679 Personal history of other diseases of the circulatory system: Secondary | ICD-10-CM | POA: Insufficient documentation

## 2014-01-24 DIAGNOSIS — Z8619 Personal history of other infectious and parasitic diseases: Secondary | ICD-10-CM | POA: Insufficient documentation

## 2014-01-24 DIAGNOSIS — R079 Chest pain, unspecified: Secondary | ICD-10-CM

## 2014-01-24 DIAGNOSIS — Z79899 Other long term (current) drug therapy: Secondary | ICD-10-CM | POA: Insufficient documentation

## 2014-01-24 DIAGNOSIS — Z8742 Personal history of other diseases of the female genital tract: Secondary | ICD-10-CM | POA: Insufficient documentation

## 2014-01-24 DIAGNOSIS — K219 Gastro-esophageal reflux disease without esophagitis: Secondary | ICD-10-CM | POA: Insufficient documentation

## 2014-01-24 LAB — COMPREHENSIVE METABOLIC PANEL
ALT: 10 U/L (ref 0–35)
AST: 11 U/L (ref 0–37)
Albumin: 3.6 g/dL (ref 3.5–5.2)
Alkaline Phosphatase: 57 U/L (ref 39–117)
BUN: 14 mg/dL (ref 6–23)
CO2: 25 mEq/L (ref 19–32)
Calcium: 9.1 mg/dL (ref 8.4–10.5)
Chloride: 105 mEq/L (ref 96–112)
Creatinine, Ser: 0.81 mg/dL (ref 0.50–1.10)
GFR calc Af Amer: >90 mL/min (ref >90)
GFR calc non Af Amer: 83 mL/min — ABNORMAL LOW (ref >90)
Glucose, Bld: 103 mg/dL — ABNORMAL HIGH (ref 70–99)
Potassium: 4.6 mEq/L (ref 3.7–5.3)
Sodium: 141 mEq/L (ref 137–147)
Total Bilirubin: 0.2 mg/dL — ABNORMAL LOW (ref 0.3–1.2)
Total Protein: 6.8 g/dL (ref 6.0–8.3)

## 2014-01-24 LAB — CBC WITH DIFFERENTIAL/PLATELET
Basophils Absolute: 0 10*3/uL (ref 0.0–0.1)
Basophils Relative: 0 % (ref 0–1)
Eosinophils Absolute: 0 10*3/uL (ref 0.0–0.7)
Eosinophils Relative: 0 % (ref 0–5)
HCT: 32.3 % — ABNORMAL LOW (ref 36.0–46.0)
Hemoglobin: 10.7 g/dL — ABNORMAL LOW (ref 12.0–15.0)
Lymphocytes Relative: 10 % — ABNORMAL LOW (ref 12–46)
Lymphs Abs: 0.8 10*3/uL (ref 0.7–4.0)
MCH: 28.8 pg (ref 26.0–34.0)
MCHC: 33.1 g/dL (ref 30.0–36.0)
MCV: 86.8 fL (ref 78.0–100.0)
Monocytes Absolute: 0.3 10*3/uL (ref 0.1–1.0)
Monocytes Relative: 4 % (ref 3–12)
Neutro Abs: 6.8 10*3/uL (ref 1.7–7.7)
Neutrophils Relative %: 86 % — ABNORMAL HIGH (ref 43–77)
Platelets: 340 10*3/uL (ref 150–400)
RBC: 3.72 MIL/uL — ABNORMAL LOW (ref 3.87–5.11)
RDW: 15.4 % (ref 11.5–15.5)
WBC: 7.9 10*3/uL (ref 4.0–10.5)

## 2014-01-24 LAB — TROPONIN I: Troponin I: <0.3 ng/mL (ref <0.30)

## 2014-01-24 MED ORDER — SODIUM CHLORIDE 0.9 % IV BOLUS (SEPSIS)
500.0000 mL | Freq: Once | INTRAVENOUS | Status: AC
  Administered 2014-01-24: 500 mL via INTRAVENOUS

## 2014-01-24 NOTE — ED Notes (Signed)
Pt c/o intermittent right side chest pain described as sharp in nature and is worse with movement that started yesterday, pt also concerned that her right foot is turning blue, pt has bluish tint noted l to right foot and radiates up toward right great toe, positive pedal pulse noted,

## 2014-01-24 NOTE — ED Provider Notes (Signed)
CSN: [id number]     Arrival date & time 01/24/14  1626 History   First MD Initiated Contact with Patient 01/24/14 1713     Chief Complaint  Patient presents with  . Chest Pain     (Consider location/radiation/quality/duration/timing/severity/associated sxs/prior Treatment) Patient is a 52 y.o. female presenting with chest pain. The history is provided by the patient (the pt states she has some chest pain today.  sometimes she has pain with her sarcoid).  Chest Pain Pain location:  Substernal area Pain quality: aching   Pain radiates to:  Does not radiate Pain radiates to the back: no   Pain severity:  Mild Onset quality:  Sudden Timing:  Intermittent Progression:  Improving Chronicity:  Recurrent Context: not breathing   Associated symptoms: no abdominal pain, no back pain, no cough, no fatigue and no headache     Past Medical History  Diagnosis Date  . Sarcoidosis   . Breast fibrocystic disorder   . Mitral valve prolapse   . Breast pain     right  . Normal stress echocardiogram Jan 2011  . Normal nuclear stress test December 2012  . Normal echocardiogram December 2012    EF 55 to 60%; mild MVP, grade 1 diastolic dysfunction, PAP 32  . Fibromyalgia   . DOE (dyspnea on exertion)   . Pulmonary fibrosis   . Gastroesophageal reflux disease   . Heart disease    Past Surgical History  Procedure Laterality Date  . Tubal ligation    . Breast lumpectomy  01/12/2011    right   Family History  Problem Relation Age of Onset  . Cardiomyopathy Mother   . Breast cancer Mother   . Heart disease Maternal Aunt   . Prostate cancer Father   . Colon cancer Neg Hx   . Neurofibromatosis Brother   . Bipolar disorder Daughter    History  Substance Use Topics  . Smoking status: Never Smoker   . Smokeless tobacco: Never Used  . Alcohol Use: No   OB History   Grav Para Term Preterm Abortions TAB SAB Ect Mult Living                 Review of Systems  Constitutional: Negative  for appetite change and fatigue.  HENT: Negative for congestion, ear discharge and sinus pressure.   Eyes: Negative for discharge.  Respiratory: Negative for cough.   Cardiovascular: Positive for chest pain.  Gastrointestinal: Negative for abdominal pain and diarrhea.  Genitourinary: Negative for frequency and hematuria.  Musculoskeletal: Negative for back pain.  Skin: Negative for rash.  Neurological: Negative for seizures and headaches.  Psychiatric/Behavioral: Negative for hallucinations.      Allergies  Bee venom; Benadryl; Codeine; and Diphenhydramine  Home Medications   Prior to Admission medications   Medication Sig Start Date End Date Taking? Authorizing Provider  albuterol (PROVENTIL) (2.5 MG/3ML) 0.083% nebulizer solution Take 2.5 mg by nebulization every 6 (six) hours as needed for wheezing or shortness of breath.   Yes Historical Provider, MD  ALPRAZolam Duanne Moron) 1 MG tablet Take 0.5-1 mg by mouth 3 (three) times daily. Takes one half tablet twice daily and one tablet at bedtime   Yes Historical Provider, MD  azaTHIOprine (IMURAN) 50 MG tablet Take 100 mg by mouth every morning.    Yes Historical Provider, MD  chlorzoxazone (PARAFON FORTE DSC) 500 MG tablet Take 1 tablet (500 mg total) by mouth 3 (three) times daily as needed for muscle spasms. 01/22/14  Yes Scott  A Luking, MD  DULoxetine (CYMBALTA) 60 MG capsule Take 1 capsule (60 mg total) by mouth daily. 11/11/13 11/11/14 Yes Levonne Spiller, MD  gabapentin (NEURONTIN) 300 MG capsule Take 1 capsule (300 mg total) by mouth 3 (three) times daily. 06/10/13 06/10/14 Yes [name], MD  oxyCODONE-acetaminophen (PERCOCET/ROXICET) 5-325 MG per tablet Take 1 tablet by mouth every 4 (four) hours as needed. 01/22/14  Yes [name], MD  pantoprazole (PROTONIX) 40 MG tablet Take 1 tablet (40 mg total) by mouth daily. 07/27/13 07/27/14 Yes [name], MD  predniSONE (DELTASONE) 10 MG tablet Take 10 mg by mouth daily.    Yes  Historical Provider, MD   BP 108/67  Pulse 58  Temp(Src) 98.1 F (36.7 C) (Oral)  Resp 23  Ht 5\' 10"  (1.778 m)  Wt 215 lb (97.523 kg)  BMI 30.85 kg/m2  SpO2 97% Physical Exam  Constitutional: She is oriented to person, place, and time. She appears well-developed.  HENT:  Head: Normocephalic.  Eyes: Conjunctivae and EOM are normal. No scleral icterus.  Neck: Neck supple. No thyromegaly present.  Cardiovascular: Normal rate and regular rhythm.  Exam reveals no gallop and no friction rub.   No murmur heard. Pulmonary/Chest: No stridor. She has no wheezes. She has no rales. She exhibits no tenderness.  Abdominal: She exhibits no distension. There is no tenderness. There is no rebound.  Musculoskeletal: Normal range of motion. She exhibits no edema.  Lymphadenopathy:    She has no cervical adenopathy.  Neurological: She is oriented to person, place, and time. She exhibits normal muscle tone. Coordination normal.  Skin: No rash noted. No erythema.  Psychiatric: She has a normal mood and affect. Her behavior is normal.    ED Course  Procedures (including critical care time) Labs Review Labs Reviewed  CBC WITH DIFFERENTIAL - Abnormal; Notable for the following:    RBC 3.72 (*)    Hemoglobin 10.7 (*)    HCT 32.3 (*)    Neutrophils Relative % 86 (*)    Lymphocytes Relative 10 (*)    All other components within normal limits  COMPREHENSIVE METABOLIC PANEL - Abnormal; Notable for the following:    Glucose, Bld 103 (*)    Total Bilirubin 0.2 (*)    GFR calc non Af Amer 83 (*)    All other components within normal limits  TROPONIN I    Imaging Review Dg Chest 2 View  01/24/2014   CLINICAL DATA:  Intermittent right-sided chest pain for 1 day. Sarcoidosis.  EXAM: CHEST  2 VIEW  COMPARISON:  Chest radiograph 09/02/2012.  Chest CT 09/11/2012.  FINDINGS: Cardiopericardial silhouette appears similar to prior. Pulmonary parenchymal scarring and interstitial lung disease appears  radiographically similar to prior examinations. No superimposed airspace disease. Monitoring leads project over the chest. No pleural effusions are identified.  IMPRESSION: No acute abnormality or interval change. Chronic changes of sarcoidosis.   Electronically Signed   By: Dereck Ligas M.D.   On: 01/24/2014 18:06     EKG Interpretation   Date/Time:  Sunday Jan 24 2014 16:32:44 EDT Ventricular Rate:  60 PR Interval:  126 QRS Duration: 72 QT Interval:  398 QTC Calculation: 398 R Axis:   44 Text Interpretation:  Normal sinus rhythm Normal ECG Confirmed by Ceasia Elwell   MD, Halim Surrette [phone]) on 01/24/2014 5:16:44 PM      MDM   Final diagnoses:  Chest pain        Maudry Diego, MD 01/24/14 2041

## 2014-01-24 NOTE — Discharge Instructions (Signed)
Follow up with your md this week for recheck  °

## 2014-01-24 NOTE — ED Notes (Signed)
Pt c/o chest pain on R side which radiates to flank area and arm.
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
## 2014-01-25 NOTE — Sleep Study (Signed)
  [place] A. Merlene Laughter, MD     [email or web address]        NOCTURNAL POLYSOMNOGRAM    LOCATION: SLEEP LAB FACILITY: [facility]   PHYSICIAN: Subrena Devereux A. Merlene Laughter, M.D.   DATE OF STUDY: 01/14/2014.   REFERRING PHYSICIAN: Deon Pilling, MD.  INDICATIONS: This is a 53 year old female who presents with fatigue and snoring.  MEDICATIONS:  Prior to Admission medications   Medication Sig Start Date End Date Taking? Authorizing Provider  albuterol (PROVENTIL) (2.5 MG/3ML) 0.083% nebulizer solution Take 2.5 mg by nebulization every 6 (six) hours as needed for wheezing or shortness of breath.    Historical Provider, MD  ALPRAZolam Duanne Moron) 1 MG tablet Take 0.5-1 mg by mouth 3 (three) times daily. Takes one half tablet twice daily and one tablet at bedtime    Historical Provider, MD  azaTHIOprine (IMURAN) 50 MG tablet Take 100 mg by mouth every morning.     Historical Provider, MD  chlorzoxazone (PARAFON FORTE DSC) 500 MG tablet Take 1 tablet (500 mg total) by mouth 3 (three) times daily as needed for muscle spasms. 01/22/14   [name], MD  DULoxetine (CYMBALTA) 60 MG capsule Take 1 capsule (60 mg total) by mouth daily. 11/11/13 11/11/14  Levonne Spiller, MD  gabapentin (NEURONTIN) 300 MG capsule Take 1 capsule (300 mg total) by mouth 3 (three) times daily. 06/10/13 06/10/14  [name], MD  oxyCODONE-acetaminophen (PERCOCET/ROXICET) 5-325 MG per tablet Take 1 tablet by mouth every 4 (four) hours as needed. 01/22/14   [name], MD  pantoprazole (PROTONIX) 40 MG tablet Take 1 tablet (40 mg total) by mouth daily. 07/27/13 07/27/14  [name], MD  predniSONE (DELTASONE) 10 MG tablet Take 10 mg by mouth daily.     Historical Provider, MD      EPWORTH SLEEPINESS SCALE: 5.   BMI: 30.   ARCHITECTURAL SUMMARY: Total recording time was 400 minutes. Sleep efficiency 81 %. Sleep latency 35 minutes. REM latency 81 minutes. Stage NI 6 %, N2 53 % and N3 21 % and REM sleep 20 %.     RESPIRATORY DATA:  Baseline oxygen saturation is 94 %. The lowest saturation is 79 %. The diagnostic AHI is 6. The RDI is 6. The REM AHI is 31.  LIMB MOVEMENT SUMMARY: PLM index 1.   ELECTROCARDIOGRAM SUMMARY: Average heart rate is 67 with no significant dysrhythmias observed.   IMPRESSION:  1. Mild REM-related obstructive sleep apnea syndrome requiring positive pressure treatment.  Thanks for this referral.  Homero Hyson A. Merlene Laughter, M.D. Diplomat, Tax adviser of Sleep Medicine.

## 2014-02-01 ENCOUNTER — Ambulatory Visit (INDEPENDENT_AMBULATORY_CARE_PROVIDER_SITE_OTHER): Payer: 59 | Admitting: Family Medicine

## 2014-02-01 ENCOUNTER — Encounter: Payer: Self-pay | Admitting: Family Medicine

## 2014-02-01 ENCOUNTER — Ambulatory Visit: Payer: 59 | Admitting: Family Medicine

## 2014-02-01 VITALS — BP 114/80 | Temp 97.4°F | Ht 70.0 in | Wt 213.0 lb

## 2014-02-01 DIAGNOSIS — R300 Dysuria: Secondary | ICD-10-CM

## 2014-02-01 LAB — POCT URINALYSIS DIPSTICK
Spec Grav, UA: <=1.005
pH, UA: 7

## 2014-02-01 MED ORDER — CEFPROZIL 500 MG PO TABS: 500.0000 mg | ORAL_TABLET | Freq: Two times a day (BID) | ORAL | Status: DC

## 2014-02-01 NOTE — Progress Notes (Signed)
   Subjective:    Patient ID: Kathleen [name], female    DOB: Sep 20, 1960, 53 y.o.   MRN: [id number]  Emesis  This is a new problem. The current episode started in the past 7 days. The problem occurs intermittently. The problem has been unchanged. The emesis has an appearance of stomach contents. There has been no fever. Associated symptoms include abdominal pain and diarrhea. Pertinent negatives include no chest pain or coughing. She has tried increased fluids for the symptoms. The treatment provided mild relief.   Patient states she is having dysuria for about 2 days now. No hematuria Felt dizzy this am  Review of Systems  HENT: Negative for congestion.   Respiratory: Positive for shortness of breath. Negative for cough.   Cardiovascular: Negative for chest pain.  Gastrointestinal: Positive for vomiting, abdominal pain and diarrhea. Negative for blood in stool and anal bleeding.       Objective:   Physical Exam  Vitals reviewed. Constitutional: She appears well-nourished. No distress.  Cardiovascular: Normal rate, regular rhythm and normal heart sounds.   No murmur heard. Pulmonary/Chest: Effort normal and breath sounds normal. No respiratory distress.  Abdominal: Soft. There is tenderness.  Slight abd tenderness no rebound  Musculoskeletal: She exhibits no edema.  Lymphadenopathy:    She has no cervical adenopathy.  Neurological: She is alert. She exhibits normal muscle tone.  Psychiatric: Her behavior is normal.    t      Assessment & Plan:  UTI- Cefzil, should get well, warnings discussed  Gastroenteritis- could be related to what she ate, resolving, call if worse, warnings discussed

## 2014-02-01 NOTE — Patient Instructions (Signed)
Viral Gastroenteritis Viral gastroenteritis is also known as stomach flu. This condition affects the stomach and intestinal tract. It can cause sudden diarrhea and vomiting. The illness typically lasts 3 to 8 days. Most people develop an immune response that eventually gets rid of the virus. While this natural response develops, the virus can make you quite ill. CAUSES  Many different viruses can cause gastroenteritis, such as rotavirus or noroviruses. You can catch one of these viruses by consuming contaminated food or water. You may also catch a virus by sharing utensils or other personal items with an infected person or by touching a contaminated surface. SYMPTOMS  The most common symptoms are diarrhea and vomiting. These problems can cause a severe loss of body fluids (dehydration) and a body salt (electrolyte) imbalance. Other symptoms may include:  Fever.  Headache.  Fatigue.  Abdominal pain. DIAGNOSIS  Your caregiver can usually diagnose viral gastroenteritis based on your symptoms and a physical exam. A stool sample may also be taken to test for the presence of viruses or other infections. TREATMENT  This illness typically goes away on its own. Treatments are aimed at rehydration. The most serious cases of viral gastroenteritis involve vomiting so severely that you are not able to keep fluids down. In these cases, fluids must be given through an intravenous line (IV). HOME CARE INSTRUCTIONS   Drink enough fluids to keep your urine clear or pale yellow. Drink small amounts of fluids frequently and increase the amounts as tolerated.  Ask your caregiver for specific rehydration instructions.  Avoid:  Foods high in sugar.  Alcohol.  Carbonated drinks.  Tobacco.  Juice.  Caffeine drinks.  Extremely hot or cold fluids.  Fatty, greasy foods.  Too much intake of anything at one time.  Dairy products until 24 to 48 hours after diarrhea stops.  You may consume probiotics.  Probiotics are active cultures of beneficial bacteria. They may lessen the amount and number of diarrheal stools in adults. Probiotics can be found in yogurt with active cultures and in supplements.  Wash your hands well to avoid spreading the virus.  Only take over-the-counter or prescription medicines for pain, discomfort, or fever as directed by your caregiver. Do not give aspirin to children. Antidiarrheal medicines are not recommended.  Ask your caregiver if you should continue to take your regular prescribed and over-the-counter medicines.  Keep all follow-up appointments as directed by your caregiver. SEEK IMMEDIATE MEDICAL CARE IF:   You are unable to keep fluids down.  You do not urinate at least once every 6 to 8 hours.  You develop shortness of breath.  You notice blood in your stool or vomit. This may look like coffee grounds.  You have abdominal pain that increases or is concentrated in one small area (localized).  You have persistent vomiting or diarrhea.  You have a fever.  The patient is a child younger than 3 months, and he or she has a fever.  The patient is a child older than 3 months, and he or she has a fever and persistent symptoms.  The patient is a child older than 3 months, and he or she has a fever and symptoms suddenly get worse.  The patient is a baby, and he or she has no tears when crying. MAKE SURE YOU:   Understand these instructions.  Will watch your condition.  Will get help right away if you are not doing well or get worse. Document Released: 08/13/2005 Document Revised: 11/05/2011 Document Reviewed: 05/30/2011   [employer] Patient Information 2014 [employer].  

## 2014-02-18 ENCOUNTER — Ambulatory Visit (INDEPENDENT_AMBULATORY_CARE_PROVIDER_SITE_OTHER): Payer: 59 | Admitting: Family Medicine

## 2014-02-18 ENCOUNTER — Encounter: Payer: Self-pay | Admitting: Family Medicine

## 2014-02-18 VITALS — BP 102/68 | Ht 70.0 in | Wt 213.0 lb

## 2014-02-18 DIAGNOSIS — G4733 Obstructive sleep apnea (adult) (pediatric): Secondary | ICD-10-CM | POA: Insufficient documentation

## 2014-02-18 DIAGNOSIS — F3289 Other specified depressive episodes: Secondary | ICD-10-CM

## 2014-02-18 DIAGNOSIS — F32A Depression, unspecified: Secondary | ICD-10-CM

## 2014-02-18 DIAGNOSIS — F329 Major depressive disorder, single episode, unspecified: Secondary | ICD-10-CM

## 2014-02-18 DIAGNOSIS — D869 Sarcoidosis, unspecified: Secondary | ICD-10-CM

## 2014-02-18 MED ORDER — BUPROPION HCL ER (SR) 150 MG PO TB12: 150.0000 mg | ORAL_TABLET | Freq: Two times a day (BID) | ORAL | Status: DC

## 2014-02-18 MED ORDER — EPINEPHRINE 0.3 MG/0.3ML IJ SOAJ: 0.3000 mg | Freq: Once | INTRAMUSCULAR | Status: DC

## 2014-02-18 NOTE — Progress Notes (Signed)
   Subjective:    Patient ID: Kathleen [name], female    DOB: Feb 24, 1961, 53 y.o.   MRN: [id number]  HPIDepression. Taking cymbalta 60mg . Patient states its not helping any more. Under a lot of stress from family issues.  Relates she is coughing up a lot of phlegm   Bilateral foot numbness. Ongoing. Related to shingles.   Requesting epi pen for bee allergy.   Review of Systems  Constitutional: Negative for fever and activity change.  HENT: Positive for congestion and rhinorrhea. Negative for ear pain.   Eyes: Negative for discharge.  Respiratory: Positive for cough and shortness of breath. Negative for wheezing.   Cardiovascular: Negative for chest pain.  Gastrointestinal: Negative for abdominal pain.  left foot numb     Objective:   Physical Exam  Vitals reviewed. Constitutional: She appears well-nourished. No distress.  Cardiovascular: Normal rate, regular rhythm and normal heart sounds.   No murmur heard. Pulmonary/Chest: Effort normal and breath sounds normal. No respiratory distress.  Musculoskeletal: She exhibits no edema.  Lymphadenopathy:    She has no cervical adenopathy.  Neurological: She is alert. She exhibits normal muscle tone.  Psychiatric: Her behavior is normal.          Assessment & Plan:  #1 probable sleep apnea recommend sleep study. Await the results of this. #2 sarcoidosis stable under the care of specialist patient disabled #3 depression patient needs to get back and with psychology for counseling. Greater than 25 minutes spent with patient discussing all of her issues and trying to help bring them all together.

## 2014-02-18 NOTE — Patient Instructions (Signed)
Wellbutrin start 1 each evening for 1 week then 1 twice a day

## 2014-02-23 ENCOUNTER — Ambulatory Visit (INDEPENDENT_AMBULATORY_CARE_PROVIDER_SITE_OTHER): Payer: 59 | Admitting: Psychology

## 2014-02-23 DIAGNOSIS — F411 Generalized anxiety disorder: Secondary | ICD-10-CM

## 2014-02-23 DIAGNOSIS — F332 Major depressive disorder, recurrent severe without psychotic features: Secondary | ICD-10-CM

## 2014-03-15 ENCOUNTER — Encounter: Payer: Self-pay | Admitting: Family Medicine

## 2014-03-15 ENCOUNTER — Ambulatory Visit (INDEPENDENT_AMBULATORY_CARE_PROVIDER_SITE_OTHER): Payer: 59 | Admitting: Family Medicine

## 2014-03-15 VITALS — BP 118/72 | Temp 98.2°F | Ht 70.0 in | Wt 211.4 lb

## 2014-03-15 DIAGNOSIS — D869 Sarcoidosis, unspecified: Secondary | ICD-10-CM

## 2014-03-15 DIAGNOSIS — J209 Acute bronchitis, unspecified: Secondary | ICD-10-CM

## 2014-03-15 DIAGNOSIS — J208 Acute bronchitis due to other specified organisms: Secondary | ICD-10-CM

## 2014-03-15 MED ORDER — OXYCODONE-ACETAMINOPHEN 5-325 MG PO TABS: 1.0000 | ORAL_TABLET | ORAL | Status: DC | PRN

## 2014-03-15 MED ORDER — BENZONATATE 100 MG PO CAPS: 100.0000 mg | ORAL_CAPSULE | Freq: Four times a day (QID) | ORAL | Status: DC | PRN

## 2014-03-15 MED ORDER — PREDNISONE 20 MG PO TABS: ORAL_TABLET | ORAL | Status: DC

## 2014-03-15 MED ORDER — ONDANSETRON HCL 8 MG PO TABS: 8.0000 mg | ORAL_TABLET | Freq: Three times a day (TID) | ORAL | Status: DC | PRN

## 2014-03-15 MED ORDER — LEVOFLOXACIN 500 MG PO TABS: 500.0000 mg | ORAL_TABLET | Freq: Every day | ORAL | Status: AC

## 2014-03-15 NOTE — Patient Instructions (Signed)
Prednisone 10 mg hold while on prednisone taper

## 2014-03-15 NOTE — Progress Notes (Signed)
   Subjective:    Patient ID: ANJULI GEMMILL, female    DOB: Dec 15, 1960, 53 y.o.   MRN: [id number]  Cough This is a new problem. The current episode started in the past 7 days. Associated symptoms include nasal congestion and rhinorrhea. Pertinent negatives include no chest pain, ear pain, fever, shortness of breath or wheezing. She has tried nothing for the symptoms.   Started last weds persistant Severe cough No fever occas vomiting Some chills Fair appetite   Review of Systems  Constitutional: Negative for fever and activity change.  HENT: Positive for congestion and rhinorrhea. Negative for ear pain.   Eyes: Negative for discharge.  Respiratory: Positive for cough. Negative for shortness of breath and wheezing.   Cardiovascular: Negative for chest pain.       Objective:   Physical Exam  Nursing note and vitals reviewed. Constitutional: She appears well-developed.  HENT:  Head: Normocephalic.  Nose: Nose normal.  Mouth/Throat: Oropharynx is clear and moist. No oropharyngeal exudate.  Neck: Neck supple.  Cardiovascular: Normal rate and normal heart sounds.   No murmur heard. Pulmonary/Chest: Effort normal and breath sounds normal. She has no wheezes.  Lymphadenopathy:    She has no cervical adenopathy.  Skin: Skin is warm and dry.          Assessment & Plan:  1. Sarcoidosis Stable sees specialist in Aug  2. Acute bronchitis due to other specified organisms Antibiotic and short boost on steroids, warnings discussed

## 2014-03-19 ENCOUNTER — Ambulatory Visit: Payer: 59 | Admitting: Family Medicine

## 2014-03-26 ENCOUNTER — Ambulatory Visit (INDEPENDENT_AMBULATORY_CARE_PROVIDER_SITE_OTHER): Payer: 59 | Admitting: Family Medicine

## 2014-03-26 ENCOUNTER — Ambulatory Visit (HOSPITAL_COMMUNITY)
Admission: RE | Admit: 2014-03-26 | Discharge: 2014-03-26 | Disposition: A | Discharge status: Home / Self Care | Payer: 59 | Source: Ambulatory Visit | Attending: Family Medicine | Admitting: Family Medicine

## 2014-03-26 ENCOUNTER — Encounter: Payer: Self-pay | Admitting: Family Medicine

## 2014-03-26 VITALS — BP 118/78 | Temp 98.3°F | Ht 70.0 in | Wt 209.8 lb

## 2014-03-26 DIAGNOSIS — R0602 Shortness of breath: Secondary | ICD-10-CM | POA: Insufficient documentation

## 2014-03-26 DIAGNOSIS — R059 Cough, unspecified: Secondary | ICD-10-CM | POA: Insufficient documentation

## 2014-03-26 DIAGNOSIS — D869 Sarcoidosis, unspecified: Secondary | ICD-10-CM | POA: Insufficient documentation

## 2014-03-26 DIAGNOSIS — J209 Acute bronchitis, unspecified: Secondary | ICD-10-CM

## 2014-03-26 DIAGNOSIS — J208 Acute bronchitis due to other specified organisms: Secondary | ICD-10-CM

## 2014-03-26 DIAGNOSIS — F411 Generalized anxiety disorder: Secondary | ICD-10-CM

## 2014-03-26 DIAGNOSIS — R05 Cough: Secondary | ICD-10-CM | POA: Insufficient documentation

## 2014-03-26 MED ORDER — PREDNISONE 20 MG PO TABS: ORAL_TABLET | ORAL | Status: DC

## 2014-03-26 MED ORDER — CEFTRIAXONE SODIUM 1 G IJ SOLR
1.0000 g | Freq: Once | INTRAMUSCULAR | Status: AC
  Administered 2014-03-26: 1 g via INTRAMUSCULAR

## 2014-03-26 MED ORDER — HYDROCODONE-HOMATROPINE 5-1.5 MG/5ML PO SYRP: 5.0000 mL | ORAL_SOLUTION | Freq: Three times a day (TID) | ORAL | Status: DC | PRN

## 2014-03-26 MED ORDER — LEVOFLOXACIN 500 MG PO TABS: 500.0000 mg | ORAL_TABLET | Freq: Every day | ORAL | Status: AC

## 2014-03-26 NOTE — Progress Notes (Signed)
   Subjective:    Patient ID: Kathleen [name], female    DOB: 1961-04-11, 53 y.o.   MRN: [id number]  HPI  Patient arrives with continued cough- see office visit last week. Patient has history of Sarcoidosis and having spells where she cant catch her breath.  Went to [employer] before last visit and got sick others in the family were sick,  Neb rx's using regularly. Significant congestion and drainage. At times major coughing spells. Loses her breath.  Due to see sarcoidosis specialist next month.  Cough productive at times.  Seen recently for bronchitis.  0 2 sat 98% on room air Review of Systems No vomiting no diarrhea or rash no headache no chest pain ROS otherwise negative    Objective:   Physical Exam Alert very anxious appearing. Vitals stable. Lungs trace wheezing at most. No inspiratory crackles no tachypnea heart regular rate and rhythm. H&T slight nasal congestion. Impressive challenge during heavy coughing spells       Assessment & Plan:  Impression bronchitis with underlying sarcoidosis and dyspnea during coughing spells only. Plan sent for chest x-ray fortunately no true infiltrate. Prednisone taper antibiotics prescribed cough medicine prescribed. 25 minutes spent with patient most in discussion. WSL

## 2014-03-30 ENCOUNTER — Encounter (HOSPITAL_COMMUNITY): Payer: Self-pay | Admitting: Psychology

## 2014-03-30 NOTE — Progress Notes (Signed)
PROGRESS NOTE  Patient:  Kathleen [name]   DOB: 1961/08/02  MR Number: [id number]  Location: [facility] [address] [place] [address] Dept: [phone]  Start: 4 PM End: 5 PM  Provider/Observer:     Edgardo Roys PSYD  Chief Complaint:      Chief Complaint  Patient presents with  . Anxiety  . Depression    Reason For Service:    The patient was referred back to our office by her treating physicians Sallee Lange, MD do to significant increases in anxiety and depression I seen the patient back in 2003 and again in 2009 for severe depression and anxiety. Significant psychosocial features and stressors were present at that time. The patient also been diagnosed with fibromyalgia and other pain related conditions. 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 She was told 10 years ago according to the patient, that her condition was in a non-active phase. However, she had increasing problems with breathing and even though her doctor at that time did not think it was an active stay Dr. Wolfgang Phoenix set her up to see someone in [place] because of her concerns and his concerns about the situation. She was diagnosed with an active case and had lost 50% of her lung function and needed immune suppressant medications. Since that time, she is developed other subsequent conditions likely as a result of these needed medications. This includes the development of an active case of shingles which is causing her a great deal of pain and bladder difficulties at this point. The patient has been experiencing severe depression and anxiety and constant ruminations about frustration and fear towards past Drs. that the patient felt did not listen to her and her reports of difficulties. These are the patient's impressions and has not had any communications with previous  doctors but it is clear that she is very upset about the permanent loss of lung function and he is enormously grateful to Dr. Wolfgang Phoenix for identifying the need for further intervention and setting her up with the specialist in [facility] who is able to diagnosis and begin treating her condition.   Interventions Strategy:  Cognitive/behavioral psychotherapeutic interventions  Participation Level:   Active  Participation Quality:  Appropriate      Behavioral Observation:  Well Groomed, Alert, and Depressed and Tearful.   Current Psychosocial Factors: The patient returns today after not being seen by myself for quite some time. The patient is continuing to have significant struggle with her issues of anxiety and depression and has been a lot of stress on her continuing. The patient reports that there continues to be a lot of conflict between her and her daughter and stress associated with his relationship.  Content of Session:   Review current symptoms and continued work on therapeutic interventions.  Current Status:   The patient reports that she has had resurgence of her anxiety and depression and feels like she is just not able to cope very well. She reports severe and sustained issues of depression and anxiety.  Patient Progress:   Stable but continued to be severely depressed.  Target Goals:   Reduce the intensity, frequency, and duration of severe depression.  Last Reviewed:   02/23/2014  Goals Addressed Today:    Worked on issues specifically with her ruminating nature regarding her severe depression and anxiety.  Impression/Diagnosis:   The patient is  clearly overwhelmed by her current situation and significant reduction in physical functioning primarily related to her autoimmune disorder and its significant deleterious effect it is having on her pulmonary function. She has been told she has at least lost 50% of her lung function and it will not come back and they're trying to keep her  from losing more. The patient has a history of significant depression and anxiety in the past as well as traumatic experiences. The patient is severely depressed and anxious at this point and overwhelmed by her status in ruminating about her situation.   Diagnosis:    Axis I: Major depressive disorder, recurrent episode, severe, without mention of psychotic behavior  Generalized anxiety disorder        RODENBOUGH,JOHN R, PsyD 03/30/2014

## 2014-04-01 ENCOUNTER — Telehealth: Payer: Self-pay | Admitting: Family Medicine

## 2014-04-01 MED ORDER — HYDROCODONE-HOMATROPINE 5-1.5 MG/5ML PO SYRP: 5.0000 mL | ORAL_SOLUTION | Freq: Three times a day (TID) | ORAL | Status: DC | PRN

## 2014-04-01 NOTE — Telephone Encounter (Signed)
Hycodan 4 oz filled 03/26/14

## 2014-04-01 NOTE — Telephone Encounter (Signed)
Rx up front for patient pick up. Patient notified. 

## 2014-04-01 NOTE — Telephone Encounter (Signed)
One additional refill. Not to take with oxycodone.

## 2014-04-01 NOTE — Telephone Encounter (Signed)
HYDROcodone-homatropine (HYCODAN) 5-1.5 MG/5ML syrup  Pt states she has enough to get through today an maybe tomorrow  Morning, her cough is better but not gone. Wants to know if she can  Get another round of this please.  Call when ready  Seen 03/26/14 Filled same day

## 2014-04-05 ENCOUNTER — Encounter: Payer: Self-pay | Admitting: Family Medicine

## 2014-04-05 ENCOUNTER — Ambulatory Visit (INDEPENDENT_AMBULATORY_CARE_PROVIDER_SITE_OTHER): Payer: 59 | Admitting: Family Medicine

## 2014-04-05 VITALS — BP 128/82 | Ht 70.0 in | Wt 215.2 lb

## 2014-04-05 DIAGNOSIS — R1310 Dysphagia, unspecified: Secondary | ICD-10-CM

## 2014-04-05 NOTE — Progress Notes (Signed)
   Subjective:    Patient ID: Kathleen [name], female    DOB: [birth year], 53 y.o.   MRN: [id number]  HPI Patient with complaint of feeling like she is choking on food- and it takes her breath- Patient also states that even when not eating she wakes up at night feeling like she is choking and can't catch breath. Patient set to see Pulmonologist at the end of the month.  PMH she had endoscopy a couple years ago no history of dysphagia Review of Systems Denies vomiting denies diarrhea denies bloody stools    Objective:   Physical Exam Lungs are clear hearts regular abdomen soft no guarding rebound extremities no edema       Assessment & Plan:  Probable dysphagia seems to be more to solid and liquid will need referral to gastroenterology

## 2014-04-08 ENCOUNTER — Telehealth: Payer: Self-pay | Admitting: Family Medicine

## 2014-04-08 ENCOUNTER — Encounter: Payer: Self-pay | Admitting: Gastroenterology

## 2014-04-08 NOTE — Telephone Encounter (Signed)
appt made with Janett Billow at Dr. Celesta Aver office. Aug 31st. 10 am. Dr. Carlean Purl booked up til October. Pt states she only wants to see Dr. Carlean Purl. She wants to wait til Oct and she will call and reschedule her visit.

## 2014-04-08 NOTE — Telephone Encounter (Signed)
Certainly that is her choice but my advice is to see the PA for Dr. Carlean Purl

## 2014-04-08 NOTE — Telephone Encounter (Signed)
Patient calling to check on her referral to gastro. She said she was told she was going to be called yesterday.

## 2014-04-26 ENCOUNTER — Ambulatory Visit: Payer: Self-pay | Admitting: Gastroenterology

## 2014-04-30 ENCOUNTER — Ambulatory Visit: Payer: Self-pay | Admitting: Gastroenterology

## 2014-05-14 ENCOUNTER — Ambulatory Visit (INDEPENDENT_AMBULATORY_CARE_PROVIDER_SITE_OTHER): Payer: 59 | Admitting: Psychiatry

## 2014-05-14 ENCOUNTER — Telehealth (HOSPITAL_COMMUNITY): Payer: Self-pay | Admitting: *Deleted

## 2014-05-14 ENCOUNTER — Telehealth: Payer: Self-pay | Admitting: Family Medicine

## 2014-05-14 ENCOUNTER — Encounter (HOSPITAL_COMMUNITY): Payer: Self-pay | Admitting: Psychiatry

## 2014-05-14 VITALS — BP 112/72 | HR 82 | Ht 70.0 in | Wt 206.0 lb

## 2014-05-14 DIAGNOSIS — F332 Major depressive disorder, recurrent severe without psychotic features: Secondary | ICD-10-CM

## 2014-05-14 DIAGNOSIS — F411 Generalized anxiety disorder: Secondary | ICD-10-CM

## 2014-05-14 MED ORDER — ALPRAZOLAM 1 MG PO TABS: 0.5000 mg | ORAL_TABLET | Freq: Three times a day (TID) | ORAL | Status: DC

## 2014-05-14 MED ORDER — DULOXETINE HCL 60 MG PO CPEP: 60.0000 mg | ORAL_CAPSULE | Freq: Two times a day (BID) | ORAL | Status: DC

## 2014-05-14 MED ORDER — BUPROPION HCL ER (SR) 150 MG PO TB12
150.0000 mg | ORAL_TABLET | Freq: Two times a day (BID) | ORAL | Status: DC
Start: 2014-05-14 — End: 2014-08-03

## 2014-05-14 NOTE — Telephone Encounter (Signed)
Ok plus 5 monthly ref 

## 2014-05-14 NOTE — Telephone Encounter (Signed)
Patient needs Rx for alprazolam to [facility].

## 2014-05-14 NOTE — Telephone Encounter (Signed)
[facility] . Pt has been getting from Dr. Harrington Challenger. Dr. Nicki Reaper has never prescribed in [facility].

## 2014-05-14 NOTE — Telephone Encounter (Signed)
noted 

## 2014-05-14 NOTE — Telephone Encounter (Signed)
Pt advised to call Dr. Alan Ripper office and ask for refill since he is the physician that has been prescribing this med.
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
## 2014-05-14 NOTE — Telephone Encounter (Signed)
Called pt and advised her that she needed an appt for further refills due to her not being seen since 10-2013. Pt agreed to come into office to make appt and was scheduled for 05-14-14 and pt agreed to come to appt.

## 2014-05-20 DIAGNOSIS — Z029 Encounter for administrative examinations, unspecified: Secondary | ICD-10-CM

## 2014-05-24 ENCOUNTER — Ambulatory Visit: Payer: 59 | Admitting: Family Medicine

## 2014-05-25 ENCOUNTER — Telehealth (HOSPITAL_COMMUNITY): Payer: Self-pay | Admitting: *Deleted

## 2014-05-25 NOTE — Telephone Encounter (Signed)
lmtcb so office to resch appt due to provider being out of office

## 2014-05-27 ENCOUNTER — Ambulatory Visit (INDEPENDENT_AMBULATORY_CARE_PROVIDER_SITE_OTHER): Payer: 59 | Admitting: Family Medicine

## 2014-05-27 ENCOUNTER — Encounter: Payer: Self-pay | Admitting: Family Medicine

## 2014-05-27 VITALS — BP 128/88 | Ht 70.0 in | Wt 209.6 lb

## 2014-05-27 DIAGNOSIS — M797 Fibromyalgia: Secondary | ICD-10-CM

## 2014-05-27 DIAGNOSIS — D869 Sarcoidosis, unspecified: Secondary | ICD-10-CM

## 2014-05-27 DIAGNOSIS — R339 Retention of urine, unspecified: Secondary | ICD-10-CM

## 2014-05-27 DIAGNOSIS — M8580 Other specified disorders of bone density and structure, unspecified site: Secondary | ICD-10-CM

## 2014-05-27 MED ORDER — OXYCODONE-ACETAMINOPHEN 5-325 MG PO TABS: 1.0000 | ORAL_TABLET | ORAL | Status: DC | PRN

## 2014-05-27 NOTE — Progress Notes (Signed)
   Subjective:    Patient ID: Kathleen [name], female    DOB: 1961/06/21, 53 y.o.   MRN: [id number]  HPI Patient arrives to follow up on her breathing.  Patient needs refill of Oxycodone. She still suffers from fibromyalgia a lot of soreness and tenderness in the muscles She suffers with sarcoid she sees a specialist on regular basis she is disabled Past shingles neuralgia has to take oxycodone for the pain as well as Neurontin Patient has esophageal dysfunction as well his vocal cord dysfunction is seen speech therapy to help her with this condition She is on prednisone she needs a bone density Greater than 25 minutes the patient covering her multiple issues Review of Systems     Objective:   Physical Exam On today's exam patient not in respiratory distress the neck has no masses lungs have some crackles bilateral no wheezes heard mild cough noted heart regular normal extremities no edema significant tenderness in the upper back lower back near the knees and elbows   #1 fibromyalgia stable continue current measures #2 sarcoidosis severe under the care of specialists patient is not capable of working. #3 osteopenia on prednisone check bone density #4 patient on immunosuppressive medicine she needs ophthalmology evaluation to make sure there is no secondary problems with the retina  #5 post-shingles neuralgia-oxycodone as necessary, patient does not abuse medication. She uses it sparingly it typically lasts a couple months #6 bladder neurogenic-use in and out catheter as needed patient is going to start paying attention to how much residual urine she has she will let us know that will help [place] know if she can potentially stop doing the catheterizations      Assessment & Plan:1 fibromyalgia stable continue current measures

## 2014-05-31 ENCOUNTER — Other Ambulatory Visit (HOSPITAL_COMMUNITY): Payer: Self-pay

## 2014-06-04 ENCOUNTER — Ambulatory Visit (INDEPENDENT_AMBULATORY_CARE_PROVIDER_SITE_OTHER): Payer: 59 | Admitting: Psychiatry

## 2014-06-04 ENCOUNTER — Encounter (HOSPITAL_COMMUNITY): Payer: Self-pay | Admitting: Psychiatry

## 2014-06-04 VITALS — BP 114/59 | HR 77 | Ht 70.0 in | Wt 207.0 lb

## 2014-06-04 DIAGNOSIS — F332 Major depressive disorder, recurrent severe without psychotic features: Secondary | ICD-10-CM

## 2014-06-04 DIAGNOSIS — F411 Generalized anxiety disorder: Secondary | ICD-10-CM
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
## 2014-06-08 ENCOUNTER — Ambulatory Visit (HOSPITAL_COMMUNITY)
Admission: RE | Admit: 2014-06-08 | Discharge: 2014-06-08 | Disposition: A | Discharge status: Home / Self Care | Payer: 59 | Source: Ambulatory Visit | Attending: Family Medicine | Admitting: Family Medicine

## 2014-06-08 DIAGNOSIS — M8580 Other specified disorders of bone density and structure, unspecified site: Secondary | ICD-10-CM | POA: Diagnosis not present

## 2014-06-15 ENCOUNTER — Encounter: Payer: Self-pay | Admitting: Family Medicine

## 2014-06-15 ENCOUNTER — Ambulatory Visit: Payer: 59

## 2014-06-15 ENCOUNTER — Ambulatory Visit (INDEPENDENT_AMBULATORY_CARE_PROVIDER_SITE_OTHER): Payer: 59 | Admitting: Family Medicine

## 2014-06-15 VITALS — BP 124/78 | Ht 70.0 in | Wt 209.0 lb

## 2014-06-15 DIAGNOSIS — M545 Low back pain, unspecified: Secondary | ICD-10-CM

## 2014-06-15 DIAGNOSIS — N3000 Acute cystitis without hematuria: Secondary | ICD-10-CM

## 2014-06-15 DIAGNOSIS — Z23 Encounter for immunization: Secondary | ICD-10-CM

## 2014-06-15 LAB — POCT URINALYSIS DIPSTICK
Protein, UA: 30
Spec Grav, UA: 1.025
pH, UA: 5

## 2014-06-15 MED ORDER — OXYCODONE-ACETAMINOPHEN 5-325 MG PO TABS: 1.0000 | ORAL_TABLET | ORAL | Status: DC | PRN

## 2014-06-15 MED ORDER — CIPROFLOXACIN HCL 500 MG PO TABS: 500.0000 mg | ORAL_TABLET | Freq: Two times a day (BID) | ORAL | Status: DC

## 2014-06-15 NOTE — Progress Notes (Signed)
   Subjective:    Patient ID: Kathleen [name], female    DOB: February 19, 1961, 53 y.o.   MRN: [id number]  Back Pain This is a new problem. The current episode started in the past 7 days. The problem occurs constantly. The problem is unchanged. Pain location: left side of back. Radiates to: left flank. The pain is the same all the time. The symptoms are aggravated by sitting, standing and lying down. Associated symptoms include dysuria. Treatments tried: Oxycodone. The treatment provided mild relief.    Side pain is  constant no V no fevr , nausea Urine occas dysuria  Review of Systems  Genitourinary: Positive for dysuria.  Musculoskeletal: Positive for back pain.   Does relate some dysuria denies high fever chills states her breathing is about the same she is disabled    Objective:   Physical Exam Lungs no change compared to previous hearts regular there is flank tenderness on the left side not severe there is no sinus shingles abdomen soft       Assessment & Plan:  Flank pain probable UTI I doubt pyelonephritis we will culture the urine I recommend treatment with antibiotics if fevers severe pain or worse followup may need ultrasound. I also instructed her what to watch for regarding possibility of shingles

## 2014-06-16 NOTE — Addendum Note (Signed)
Addended byCharolotte Capuchin D on: 06/16/2014 09:42 AM   Modules accepted: Orders

## 2014-06-17 LAB — URINALYSIS, ROUTINE W REFLEX MICROSCOPIC
Bilirubin Urine: NEGATIVE
Glucose, UA: NEGATIVE mg/dL
Hgb urine dipstick: NEGATIVE
Ketones, ur: NEGATIVE mg/dL
Leukocytes, UA: NEGATIVE
Nitrite: NEGATIVE
Protein, ur: NEGATIVE mg/dL
Specific Gravity, Urine: 1.007 (ref 1.005–1.030)
Urobilinogen, UA: 0.2 mg/dL (ref 0.0–1.0)
pH: 5.5 (ref 5.0–8.0)

## 2014-06-18 NOTE — Progress Notes (Signed)
Patient will come in and give a urine sample to send to lab for urine culture, after she is done with the antibiotics. Pt verbalized understanding.

## 2014-06-24 ENCOUNTER — Other Ambulatory Visit: Payer: Self-pay | Admitting: Family Medicine

## 2014-06-24 ENCOUNTER — Telehealth: Payer: Self-pay | Admitting: Family Medicine

## 2014-06-24 NOTE — Telephone Encounter (Signed)
Cefzil 500 mg 1 twice a day for 7 days. Follow-up office visit for urinalysis and urine culture recommended in 10 days. Certainly if patient feels she is getting worse she needs to be seen sooner.

## 2014-06-24 NOTE — Telephone Encounter (Signed)
Patient calls today and says that she finished her antibiotic that she was prescribed on 06/15/14.  She says that she is still having burning with urination, feeling sluggish, and feels a "shaking" in her body.  She said she still has the pain in her side, but she is not running a fever. Please advise.  [facility]

## 2014-06-25 ENCOUNTER — Other Ambulatory Visit: Payer: Self-pay | Admitting: *Deleted

## 2014-06-25 MED ORDER — CEFPROZIL 500 MG PO TABS: 500.0000 mg | ORAL_TABLET | Freq: Two times a day (BID) | ORAL | Status: DC

## 2014-06-25 NOTE — Telephone Encounter (Signed)
Discussed with patient. Med sent to pharm.  

## 2014-07-01 ENCOUNTER — Ambulatory Visit (HOSPITAL_COMMUNITY)
Admission: RE | Admit: 2014-07-01 | Discharge: 2014-07-01 | Disposition: A | Discharge status: Home / Self Care | Payer: 59 | Source: Ambulatory Visit | Attending: Family Medicine | Admitting: Family Medicine

## 2014-07-01 ENCOUNTER — Ambulatory Visit (INDEPENDENT_AMBULATORY_CARE_PROVIDER_SITE_OTHER): Payer: 59 | Admitting: Family Medicine

## 2014-07-01 ENCOUNTER — Encounter: Payer: Self-pay | Admitting: Family Medicine

## 2014-07-01 VITALS — BP 110/74 | Ht 70.0 in | Wt 212.0 lb

## 2014-07-01 DIAGNOSIS — M25552 Pain in left hip: Secondary | ICD-10-CM | POA: Insufficient documentation

## 2014-07-01 MED ORDER — OXYCODONE-ACETAMINOPHEN 5-325 MG PO TABS: 1.0000 | ORAL_TABLET | ORAL | Status: DC | PRN

## 2014-07-01 NOTE — Progress Notes (Signed)
   Subjective:    Patient ID: Kathleen [name], female    DOB: 23-Dec-1960, 53 y.o.   MRN: [id number]  Hip Pain  The incident occurred more than 1 week ago. Incident location: fell outside at [employer]. Pain location: left hip. Treatments tried: oxycodone. The treatment provided mild relief.   [address] in the parking lot [place] onto the left side Started having left side-hip pain Stays in the hip Down the sde of the thigh Trying massage/[place]/heat  Discuss rx for catheter.    Review of Systems Complains a hip pain    Objective:   Physical Exam  Lungs clear heart regular tenderness in the hip fair range of motion      Assessment & Plan:  Hip pain I doubt a fracture but we need to rule out out x-ray recommended because of the fall range of motion exercises shown she may use pain medicine as necessary. She should gradually get better.if not getting better follow-up Prescription for catheter given has history and current problem of urinary retention

## 2014-07-07 ENCOUNTER — Telehealth: Payer: Self-pay | Admitting: Family Medicine

## 2014-07-07 DIAGNOSIS — M25559 Pain in unspecified hip: Secondary | ICD-10-CM

## 2014-07-07 NOTE — Telephone Encounter (Signed)
Patient called today to find out what she needs to do about her hip that hurts so bad.  She said that she had the x-ray done, but isn't sure what step is next.

## 2014-07-07 NOTE — Telephone Encounter (Signed)
With the hip still causing her significant pain her next option is seeing orthopedics and they can try injection and if need be also physical therapy. If so please find out which orthopedist she would like for [place] to set her up

## 2014-07-08 NOTE — Progress Notes (Signed)
Pt would like to be referred to [facility] for ortho. Referral placed, patient notified.

## 2014-07-08 NOTE — Telephone Encounter (Signed)
Pt would like to be referred to [facility] for ortho. Referral placed, patient notified.

## 2014-08-03 ENCOUNTER — Encounter (HOSPITAL_COMMUNITY): Payer: Self-pay | Admitting: Psychiatry

## 2014-08-03 ENCOUNTER — Ambulatory Visit (INDEPENDENT_AMBULATORY_CARE_PROVIDER_SITE_OTHER): Payer: 59 | Admitting: Psychiatry

## 2014-08-03 ENCOUNTER — Telehealth: Payer: Self-pay | Admitting: Family Medicine

## 2014-08-03 VITALS — BP 117/64 | HR 86 | Ht 70.0 in | Wt 212.6 lb

## 2014-08-03 DIAGNOSIS — F332 Major depressive disorder, recurrent severe without psychotic features: Secondary | ICD-10-CM

## 2014-08-03 DIAGNOSIS — F411 Generalized anxiety disorder: Secondary | ICD-10-CM

## 2014-08-03 MED ORDER — OXYCODONE-ACETAMINOPHEN 5-325 MG PO TABS: 1.0000 | ORAL_TABLET | ORAL | Status: DC | PRN

## 2014-08-03 MED ORDER — ALPRAZOLAM 1 MG PO TABS: 0.5000 mg | ORAL_TABLET | Freq: Three times a day (TID) | ORAL | Status: DC

## 2014-08-03 MED ORDER — DULOXETINE HCL 60 MG PO CPEP: 60.0000 mg | ORAL_CAPSULE | Freq: Two times a day (BID) | ORAL | Status: DC

## 2014-08-03 MED ORDER — BUPROPION HCL ER (SR) 150 MG PO TB12: 150.0000 mg | ORAL_TABLET | Freq: Two times a day (BID) | ORAL | Status: DC

## 2014-08-03 NOTE — Telephone Encounter (Signed)
May have refill of oxycodone ( and may do additional refills of her other maeds if needed as well as Rx for catheters

## 2014-08-03 NOTE — Telephone Encounter (Signed)
Rx for catheters [facility]ed [facility]. Rx for Oxycodone printed and up front for patient pick up. Patient notified.

## 2014-08-03 NOTE — Telephone Encounter (Signed)
Patient wants a refill of Oxycodone to have on hand since her insurance running out end of year
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
## 2014-08-03 NOTE — Telephone Encounter (Signed)
1) Pt needs her disposable Catheters for [facility] [phone]  Needs to fax copy of her Insurance card with this script to [facility]  2) pt states her insurance will run out at the end of this month, can we  Send in a 90 day supply on all her meds, and she will pick up the ones  She needs to hand deliver  Call when ready

## 2014-08-25 ENCOUNTER — Ambulatory Visit (INDEPENDENT_AMBULATORY_CARE_PROVIDER_SITE_OTHER): Payer: 59 | Admitting: Family Medicine

## 2014-08-25 ENCOUNTER — Encounter: Payer: Self-pay | Admitting: Family Medicine

## 2014-08-25 ENCOUNTER — Telehealth: Payer: Self-pay | Admitting: Family Medicine

## 2014-08-25 VITALS — BP 112/74 | Temp 98.9°F | Wt 214.0 lb

## 2014-08-25 DIAGNOSIS — R5383 Other fatigue: Secondary | ICD-10-CM

## 2014-08-25 DIAGNOSIS — M797 Fibromyalgia: Secondary | ICD-10-CM

## 2014-08-25 DIAGNOSIS — E782 Mixed hyperlipidemia: Secondary | ICD-10-CM | POA: Insufficient documentation

## 2014-08-25 DIAGNOSIS — D869 Sarcoidosis, unspecified: Secondary | ICD-10-CM

## 2014-08-25 DIAGNOSIS — E785 Hyperlipidemia, unspecified: Secondary | ICD-10-CM

## 2014-08-25 DIAGNOSIS — Z79899 Other long term (current) drug therapy: Secondary | ICD-10-CM

## 2014-08-25 DIAGNOSIS — D649 Anemia, unspecified: Secondary | ICD-10-CM

## 2014-08-25 DIAGNOSIS — R339 Retention of urine, unspecified: Secondary | ICD-10-CM

## 2014-08-25 MED ORDER — OXYCODONE-ACETAMINOPHEN 5-325 MG PO TABS: 1.0000 | ORAL_TABLET | ORAL | Status: DC | PRN

## 2014-08-25 MED ORDER — ESOMEPRAZOLE MAGNESIUM 40 MG PO CPDR: 40.0000 mg | DELAYED_RELEASE_CAPSULE | Freq: Every day | ORAL | Status: DC

## 2014-08-25 MED ORDER — CEFPROZIL 500 MG PO TABS: 500.0000 mg | ORAL_TABLET | Freq: Two times a day (BID) | ORAL | Status: DC

## 2014-08-25 MED ORDER — PREDNISONE 10 MG PO TABS: 10.0000 mg | ORAL_TABLET | Freq: Every day | ORAL | Status: DC

## 2014-08-25 NOTE — Telephone Encounter (Signed)
Most likley inner ear, can come aND go, not likely to cause ongoing issues, hopefully better over the next 10 days follow up if worse

## 2014-08-25 NOTE — Telephone Encounter (Signed)
Pt states that on last Saturday or Sunday she was dizzy all day if she was up Moving. If she layed down she was fine but if she got up an moved around it was  Bad. Thinking veritgo  She had a little bit of chest pain with this, an she forgot to tell you about this while here today  She has not had this since then or ever before   Please advise

## 2014-08-25 NOTE — Telephone Encounter (Signed)
[facility]

## 2014-08-25 NOTE — Progress Notes (Signed)
   Subjective:    Patient ID: Kathleen [name], female    DOB: [birth year], 53 y.o.   MRN: [id number]  Cough This is a new problem. The current episode started in the past 7 days. Associated symptoms include rhinorrhea and shortness of breath. Pertinent negatives include no chest pain, ear pain, fever or wheezing. Associated symptoms comments: Sore throat.   This patient was seen today for chronic pain  The medication list was reviewed and updated.   -Compliance with pain medication: Overall good  The patient was advised the importance of maintaining medication and not using illegal substances with these.  Refills needed: States she needs 1 refill today  The patient was educated that we can provide 3 monthly scripts for their medication, it is their responsibility to follow the instructions.  Side effects or complications from medications: She denies side effects to medications.  Patient is aware that pain medications are meant to minimize the severity of the pain to allow their pain levels to improve to allow for better function. They are aware of that pain medications cannot totally remove their pain.  Due for UDT ( at least once per year) : Next visit.   Long discussion held with patient regarding her sarcoidosis fibromyalgia chronic back pain also discussing how she is disabled unable to work. In addition to this mental health still bothers her she sees a psychiatrist for this 25 minutes was spent with the patient covering these multitude of problems     Review of Systems  Constitutional: Negative for fever and activity change.  HENT: Positive for congestion and rhinorrhea. Negative for ear pain.   Eyes: Negative for discharge.  Respiratory: Positive for cough and shortness of breath. Negative for wheezing.   Cardiovascular: Negative for chest pain.       Objective:   Physical Exam  Constitutional: She appears well-developed.  HENT:  Head: Normocephalic.  Nose: Nose  normal.  Mouth/Throat: Oropharynx is clear and moist. No oropharyngeal exudate.  Neck: Neck supple.  Cardiovascular: Normal rate and normal heart sounds.   No murmur heard. Pulmonary/Chest: Effort normal and breath sounds normal. She has no wheezes.  Lymphadenopathy:    She has no cervical adenopathy.  Skin: Skin is warm and dry.  Nursing note and vitals reviewed.         Assessment & Plan:  Pt still disabled  1. Fibromyalgia Overall stable. To do stretching exercise on a regular basis  2. Urinary retention Uses intermittent catheterization to help her.  3. Sarcoidosis Stable currently on lifelong suppressive medicines. At higher risk for infections  4. Hyperlipidemia History hyperlipidemia I do recommend checking lipid profile  Acute bronchitis Because of her sarcoidosis and recent infection I highly recommend put patient on antibiotics  gastritis Nexium prescribed she states from [place] was not helping, she states protonic's in the past did not help

## 2014-08-26 ENCOUNTER — Ambulatory Visit: Payer: 59 | Admitting: Family Medicine

## 2014-08-26 LAB — BASIC METABOLIC PANEL
BUN: 11 mg/dL (ref 6–23)
CO2: 26 mEq/L (ref 19–32)
Calcium: 9.2 mg/dL (ref 8.4–10.5)
Chloride: 103 mEq/L (ref 96–112)
Creat: 0.85 mg/dL (ref 0.50–1.10)
Glucose, Bld: 73 mg/dL (ref 70–99)
Potassium: 4 mEq/L (ref 3.5–5.3)
Sodium: 139 mEq/L (ref 135–145)

## 2014-08-26 LAB — LIPID PANEL
Cholesterol: 205 mg/dL — ABNORMAL HIGH (ref 0–200)
HDL: 51 mg/dL (ref >39)
LDL Cholesterol: 123 mg/dL — ABNORMAL HIGH (ref 0–99)
Total CHOL/HDL Ratio: 4 Ratio
Triglycerides: 156 mg/dL — ABNORMAL HIGH (ref <150)
VLDL: 31 mg/dL (ref 0–40)

## 2014-08-26 LAB — CBC WITH DIFFERENTIAL/PLATELET
Basophils Absolute: 0 10*3/uL (ref 0.0–0.1)
Basophils Relative: 0 % (ref 0–1)
Eosinophils Absolute: 0.1 10*3/uL (ref 0.0–0.7)
Eosinophils Relative: 1 % (ref 0–5)
HCT: 32.2 % — ABNORMAL LOW (ref 36.0–46.0)
Hemoglobin: 11 g/dL — ABNORMAL LOW (ref 12.0–15.0)
Lymphocytes Relative: 10 % — ABNORMAL LOW (ref 12–46)
Lymphs Abs: 1 10*3/uL (ref 0.7–4.0)
MCH: 27.4 pg (ref 26.0–34.0)
MCHC: 34.2 g/dL (ref 30.0–36.0)
MCV: 80.3 fL (ref 78.0–100.0)
MPV: 8.4 fL — ABNORMAL LOW (ref 8.6–12.4)
Monocytes Absolute: 1 10*3/uL (ref 0.1–1.0)
Monocytes Relative: 11 % (ref 3–12)
Neutro Abs: 7.4 10*3/uL (ref 1.7–7.7)
Neutrophils Relative %: 78 % — ABNORMAL HIGH (ref 43–77)
Platelets: 481 10*3/uL — ABNORMAL HIGH (ref 150–400)
RBC: 4.01 MIL/uL (ref 3.87–5.11)
RDW: 16.1 % — ABNORMAL HIGH (ref 11.5–15.5)
WBC: 9.5 10*3/uL (ref 4.0–10.5)

## 2014-08-31 ENCOUNTER — Ambulatory Visit (HOSPITAL_COMMUNITY): Payer: Self-pay | Admitting: Psychology

## 2014-09-01 NOTE — Addendum Note (Signed)
Addended byCharolotte Capuchin D on: 09/01/2014 09:31 AM   Modules accepted: Orders

## 2014-09-01 NOTE — Progress Notes (Signed)
Patient notified and verbalized understanding of test results. No further questions. Orders are in.

## 2014-09-02 LAB — HEMOGLOBIN AND HEMATOCRIT, BLOOD
HCT: 32.2 % — ABNORMAL LOW (ref 36.0–46.0)
Hemoglobin: 10.9 g/dL — ABNORMAL LOW (ref 12.0–15.0)

## 2014-09-03 LAB — IRON AND TIBC
%SAT: 12 % — ABNORMAL LOW (ref 20–55)
Iron: 42 ug/dL (ref 42–145)
TIBC: 340 ug/dL (ref 250–470)
UIBC: 298 ug/dL (ref 125–400)

## 2014-09-03 LAB — FERRITIN: Ferritin: 10 ng/mL (ref 10–291)

## 2014-09-03 NOTE — Telephone Encounter (Signed)
Patient said she is no longer experiencing the dizziness. She said she feels much better.

## 2014-09-09 ENCOUNTER — Telehealth: Payer: Self-pay | Admitting: Family Medicine

## 2014-09-09 MED ORDER — CEFPROZIL 500 MG PO TABS: 500.0000 mg | ORAL_TABLET | Freq: Two times a day (BID) | ORAL | Status: DC

## 2014-09-09 NOTE — Telephone Encounter (Signed)
Pt is requesting a refill on her cephprozil. [facility]

## 2014-09-09 NOTE — Telephone Encounter (Signed)
Medication sent in to the pharmacy. Patient notified to follow up if no better.

## 2014-09-09 NOTE — Telephone Encounter (Signed)
May refill it, if not doing better than I rec follow up

## 2014-09-09 NOTE — Telephone Encounter (Signed)
Still with cough and congestion

## 2014-09-27 ENCOUNTER — Emergency Department (HOSPITAL_COMMUNITY)
Admission: EM | Admit: 2014-09-27 | Discharge: 2014-09-27 | Disposition: A | Discharge status: Home / Self Care | Payer: 59 | Attending: Emergency Medicine | Admitting: Emergency Medicine

## 2014-09-27 ENCOUNTER — Encounter (HOSPITAL_COMMUNITY): Payer: Self-pay | Admitting: *Deleted

## 2014-09-27 DIAGNOSIS — Z8659 Personal history of other mental and behavioral disorders: Secondary | ICD-10-CM | POA: Insufficient documentation

## 2014-09-27 DIAGNOSIS — M797 Fibromyalgia: Secondary | ICD-10-CM | POA: Insufficient documentation

## 2014-09-27 DIAGNOSIS — R0602 Shortness of breath: Secondary | ICD-10-CM | POA: Insufficient documentation

## 2014-09-27 DIAGNOSIS — K219 Gastro-esophageal reflux disease without esophagitis: Secondary | ICD-10-CM | POA: Insufficient documentation

## 2014-09-27 DIAGNOSIS — N939 Abnormal uterine and vaginal bleeding, unspecified: Secondary | ICD-10-CM

## 2014-09-27 DIAGNOSIS — Z7952 Long term (current) use of systemic steroids: Secondary | ICD-10-CM | POA: Insufficient documentation

## 2014-09-27 DIAGNOSIS — D649 Anemia, unspecified: Secondary | ICD-10-CM | POA: Insufficient documentation

## 2014-09-27 DIAGNOSIS — N938 Other specified abnormal uterine and vaginal bleeding: Secondary | ICD-10-CM | POA: Insufficient documentation

## 2014-09-27 DIAGNOSIS — R079 Chest pain, unspecified: Secondary | ICD-10-CM | POA: Insufficient documentation

## 2014-09-27 DIAGNOSIS — Z8709 Personal history of other diseases of the respiratory system: Secondary | ICD-10-CM | POA: Insufficient documentation

## 2014-09-27 DIAGNOSIS — Z79899 Other long term (current) drug therapy: Secondary | ICD-10-CM | POA: Insufficient documentation

## 2014-09-27 DIAGNOSIS — R42 Dizziness and giddiness: Secondary | ICD-10-CM | POA: Insufficient documentation

## 2014-09-27 HISTORY — DX: Anemia, unspecified: D64.9

## 2014-09-27 LAB — CBC
HCT: 31.3 % — ABNORMAL LOW (ref 36.0–46.0)
Hemoglobin: 10 g/dL — ABNORMAL LOW (ref 12.0–15.0)
MCH: 27.6 pg (ref 26.0–34.0)
MCHC: 31.9 g/dL (ref 30.0–36.0)
MCV: 86.5 fL (ref 78.0–100.0)
Platelets: 405 10*3/uL — ABNORMAL HIGH (ref 150–400)
RBC: 3.62 MIL/uL — ABNORMAL LOW (ref 3.87–5.11)
RDW: 16.2 % — ABNORMAL HIGH (ref 11.5–15.5)
WBC: 7.1 10*3/uL (ref 4.0–10.5)

## 2014-09-27 LAB — I-STAT BETA HCG BLOOD, ED (MC, WL, AP ONLY): I-stat hCG, quantitative: <5 m[IU]/mL (ref <5)

## 2014-09-27 NOTE — Discharge Instructions (Signed)

## 2014-09-27 NOTE — ED Notes (Signed)
Vaginal bleeding for 2 weeks, feels dizzy.

## 2014-09-27 NOTE — ED Provider Notes (Signed)
CSN: [id number]     Arrival date & time 09/27/14  2032 History  This chart was scribed for Kathleen Cable, MD by Chester Holstein, ED Scribe. This patient was seen in room APA09/APA09 and the patient's care was started at 9:16 PM.    Chief Complaint  Patient presents with  . Vaginal Bleeding   Patient is a 54 y.o. female presenting with vaginal bleeding. The history is provided by the patient. No language interpreter was used.  Vaginal Bleeding Quality:  Clots and heavier than menses Severity:  Severe Duration:  2 weeks Timing:  Constant Progression:  Unchanged Chronicity:  New Menstrual history:  Regular Number of pads used:  Unspecified Number of tampons used:  Unspecified Possible pregnancy: no   Associated symptoms: dizziness   Associated symptoms: no fever    HPI Comments: Kathleen [name] is a 54 y.o. female with PMHx of breast fibrocystic disorder, mitral valve prolapse, fibromyalgia, DOE, pulmonary fibrosis, GERD, heart disease, and anemia, who presents to the Emergency Department complaining of vaginal bleeding for 2 weeks. Pt states she had labs done with low HGB count around 3 weeks ago.  Pt was told her iron may be low. Pt has been taking FE supplements. Pt notes LNMP was around 01/17, noting menstrual periods are 4 days at baseline, but vaginal bleeding has continued and is heavier than menses. Pt notes associated sporadic dizziness, mild chest pain and SOB (reports h/o sarcoidosis), and intermittent pelvic cramping. She notes moderate amounts of clots passed. Pt is not on blood thinners. Pt denies fever, vomiting, and LOC. Pt's OB/GYN is Dr. Radene Knee.  Past Medical History  Diagnosis Date  . Sarcoidosis   . Breast fibrocystic disorder   . Mitral valve prolapse   . Breast pain     right  . Normal stress echocardiogram Jan 2011  . Normal nuclear stress test December 2012  . Normal echocardiogram December 2012    EF 55 to 60%; mild MVP, grade 1 diastolic dysfunction, PAP 32   . Fibromyalgia   . DOE (dyspnea on exertion)   . Pulmonary fibrosis   . Gastroesophageal reflux disease   . Heart disease   . Anemia    Past Surgical History  Procedure Laterality Date  . Tubal ligation    . Breast lumpectomy  01/12/2011    right   Family History  Problem Relation Age of Onset  . Cardiomyopathy Mother   . Breast cancer Mother   . Heart disease Maternal Aunt   . Prostate cancer Father   . Colon cancer Neg Hx   . Neurofibromatosis Brother   . Bipolar disorder Daughter    History  Substance Use Topics  . Smoking status: Never Smoker   . Smokeless tobacco: Never Used  . Alcohol Use: No   OB History    No data available     Review of Systems  Constitutional: Negative for fever.  Gastrointestinal: Negative for vomiting.  Genitourinary: Positive for vaginal bleeding and pelvic pain (cramps).  Neurological: Positive for dizziness. Negative for syncope.  All other systems reviewed and are negative.     Allergies  Bee venom; Benadryl; Codeine; Diphenhydramine; and Morphine and related  Home Medications   Prior to Admission medications   Medication Sig Start Date End Date Taking? Authorizing Provider  ALPRAZolam Duanne Moron) 1 MG tablet Take 0.5-1 tablets (0.5-1 mg total) by mouth 3 (three) times daily. Takes one half tablet twice daily and one tablet at bedtime 08/03/14  Yes Levonne Spiller,  MD  azaTHIOprine (IMURAN) 50 MG tablet Take 100 mg by mouth every morning.    Yes Historical Provider, MD  buPROPion (WELLBUTRIN SR) 150 MG 12 hr tablet Take 1 tablet (150 mg total) by mouth 2 (two) times daily. 08/03/14  Yes Levonne Spiller, MD  DULoxetine (CYMBALTA) 60 MG capsule Take 1 capsule (60 mg total) by mouth 2 (two) times daily. 08/03/14 08/03/15 Yes Levonne Spiller, MD  famotidine (PEPCID) 20 MG tablet Take 20 mg by mouth 2 (two) times daily. 07/09/14  Yes Historical Provider, MD  ferrous sulfate 325 (65 FE) MG EC tablet Take 325 mg by mouth daily.   Yes Historical  Provider, MD  gabapentin (NEURONTIN) 300 MG capsule TAKE 1 CAPSULE BY MOUTH 3 TIMES DAILY. Patient taking differently: TAKE 1 CAPSULE BY MOUTH 3 TIMES DAILY PRN 06/24/14  Yes [name], MD  oxyCODONE-acetaminophen (PERCOCET/ROXICET) 5-325 MG per tablet Take 1 tablet by mouth every 4 (four) hours as needed. Patient taking differently: Take 1 tablet by mouth every 4 (four) hours as needed for moderate pain or severe pain.  08/25/14  Yes [name], MD  predniSONE (DELTASONE) 10 MG tablet Take 1 tablet (10 mg total) by mouth daily. 08/25/14  Yes [name], MD  albuterol (PROVENTIL) (2.5 MG/3ML) 0.083% nebulizer solution Take 2.5 mg by nebulization every 6 (six) hours as needed for wheezing or shortness of breath.    Historical Provider, MD  cefPROZIL (CEFZIL) 500 MG tablet Take 1 tablet (500 mg total) by mouth 2 (two) times daily. Patient not taking: Reported on 09/27/2014 09/09/14   [name], MD  chlorzoxazone (PARAFON FORTE DSC) 500 MG tablet Take 1 tablet (500 mg total) by mouth 3 (three) times daily as needed for muscle spasms. Patient not taking: Reported on 09/27/2014 01/22/14   [name], MD  EPINEPHrine 0.3 mg/0.3 mL IJ SOAJ injection Inject 0.3 mLs (0.3 mg total) into the muscle once. 02/18/14   [name], MD  esomeprazole (NEXIUM) 40 MG capsule Take 1 capsule (40 mg total) by mouth daily. Patient not taking: Reported on 09/27/2014 08/25/14   [name], MD  ondansetron (ZOFRAN) 8 MG tablet Take 1 tablet (8 mg total) by mouth every 8 (eight) hours as needed for nausea. 03/15/14   [name], MD   BP 111/64 mmHg  Pulse 97  Temp(Src) 97.8 F (36.6 C) (Oral)  Resp 20  Ht 5\' 10"  (1.778 m)  Wt 200 lb (90.719 kg)  BMI 28.70 kg/m2  SpO2 100%  LMP 09/13/2014 Physical Exam  Nursing note and vitals reviewed. CONSTITUTIONAL: Well developed/well nourished HEAD: Normocephalic/atraumatic EYES: EOMI/PERRL ENMT: Mucous membranes moist NECK: supple no meningeal  signs SPINE/BACK:entire spine nontender CV: S1/S2 noted, no murmurs/rubs/gallops noted LUNGS: Lungs are clear to auscultation bilaterally, no apparent distress ABDOMEN: soft, nontender, no rebound or guarding, bowel sounds noted throughout abdomen GU:no cva tenderness.  Pelvic - small amt of blood in vault.  No lacerations or signs of trauma.  No pooling of blood.  Female Chaperone present NEURO: Pt is awake/alert/appropriate, moves all extremitiesx4.  No facial droop.   EXTREMITIES: pulses normal/equal, full ROM SKIN: warm, color normal PSYCH: no abnormalities of mood noted, alert and oriented to situation   ED Course  Procedures  DIAGNOSTIC STUDIES: Oxygen Saturation is 100% on room air, normal by my interpretation.    COORDINATION OF CARE: 9:21 PM Discussed treatment plan with patient at beside, the patient agrees with the plan and has no  further questions at this time.   Labs Review Labs Reviewed  CBC - Abnormal; Notable for the following:    RBC 3.62 (*)    Hemoglobin 10.0 (*)    HCT 31.3 (*)    RDW 16.2 (*)    Platelets 405 (*)    All other components within normal limits  I-STAT BETA HCG BLOOD, ED ([facility], [facility], [facility] ONLY)    MDM   Final diagnoses:  Vaginal bleeding    Nursing notes including past medical history and social history reviewed and considered in documentation Labs/vital reviewed myself and considered during evaluation    I personally performed the services described in this documentation, which was scribed in my presence. The recorded information has been reviewed and is accurate.       Kathleen Cable, MD 09/27/14 2152

## 2014-10-05 ENCOUNTER — Ambulatory Visit (INDEPENDENT_AMBULATORY_CARE_PROVIDER_SITE_OTHER): Payer: 59 | Admitting: Psychiatry

## 2014-10-05 ENCOUNTER — Encounter (HOSPITAL_COMMUNITY): Payer: Self-pay | Admitting: Psychiatry

## 2014-10-05 VITALS — BP 100/52 | HR 77 | Ht 70.0 in | Wt 213.6 lb

## 2014-10-05 DIAGNOSIS — F411 Generalized anxiety disorder: Secondary | ICD-10-CM

## 2014-10-05 DIAGNOSIS — F332 Major depressive disorder, recurrent severe without psychotic features: Secondary | ICD-10-CM

## 2014-10-05 MED ORDER — ALPRAZOLAM 1 MG PO TABS: 0.5000 mg | ORAL_TABLET | Freq: Three times a day (TID) | ORAL | Status: DC

## 2014-10-05 MED ORDER — BUPROPION HCL ER (SR) 150 MG PO TB12: 150.0000 mg | ORAL_TABLET | Freq: Two times a day (BID) | ORAL | Status: DC

## 2014-10-05 MED ORDER — DULOXETINE HCL 60 MG PO CPEP
60.0000 mg | ORAL_CAPSULE | Freq: Two times a day (BID) | ORAL | Status: DC
Start: 2014-10-05 — End: 2015-02-11
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
## 2014-10-13 ENCOUNTER — Ambulatory Visit (INDEPENDENT_AMBULATORY_CARE_PROVIDER_SITE_OTHER): Payer: 59 | Admitting: Family Medicine

## 2014-10-13 ENCOUNTER — Encounter: Payer: Self-pay | Admitting: Family Medicine

## 2014-10-13 VITALS — BP 130/88 | Ht 70.0 in | Wt 216.0 lb

## 2014-10-13 DIAGNOSIS — J208 Acute bronchitis due to other specified organisms: Secondary | ICD-10-CM

## 2014-10-13 DIAGNOSIS — J0120 Acute ethmoidal sinusitis, unspecified: Secondary | ICD-10-CM

## 2014-10-13 MED ORDER — CEFPROZIL 500 MG PO TABS: 500.0000 mg | ORAL_TABLET | Freq: Two times a day (BID) | ORAL | Status: DC

## 2014-10-13 MED ORDER — PREDNISONE 20 MG PO TABS: ORAL_TABLET | ORAL | Status: DC

## 2014-10-13 NOTE — Progress Notes (Signed)
   Subjective:    Patient ID: Kathleen [name], female    DOB: 05-31-1961, 54 y.o.   MRN: [id number]  Wheezing  This is a new problem. The current episode started in the past 7 days. The problem occurs daily. The problem has been gradually worsening. Associated symptoms include chest pain, coughing, rhinorrhea, shortness of breath and a sore throat. Pertinent negatives include no chills, ear pain, fever, hemoptysis or vomiting. The symptoms are aggravated by URIs. She has tried beta agonist inhalers for the symptoms. The treatment provided no relief. Her past medical history is significant for chronic lung disease.   PMH benign, lung condition   Review of Systems  Constitutional: Negative for fever, chills and activity change.  HENT: Positive for congestion, rhinorrhea and sore throat. Negative for ear pain.   Eyes: Negative for discharge.  Respiratory: Positive for cough, shortness of breath and wheezing. Negative for hemoptysis.   Cardiovascular: Positive for chest pain.  Gastrointestinal: Negative for vomiting.       Objective:   Physical Exam  Constitutional: She appears well-developed.  HENT:  Head: Normocephalic.  Nose: Nose normal.  Mouth/Throat: Oropharynx is clear and moist. No oropharyngeal exudate.  Neck: Neck supple.  Cardiovascular: Normal rate and normal heart sounds.   No murmur heard. Pulmonary/Chest: Effort normal and breath sounds normal. She has no wheezes.  Lymphadenopathy:    She has no cervical adenopathy.  Skin: Skin is warm and dry.  Nursing note and vitals reviewed.  Patient not toxic       Assessment & Plan:  Has a history of fibrosis continue current measures with specialist Secondary pulmonary infection antibiotics prescribed prednisone recommended warning signs discussed  I encouraged patient to follow-up somewhere in the next several weeks to discuss her standard health issues and concerns she was seen today as part of urgent care visit

## 2014-10-13 NOTE — Progress Notes (Deleted)
Patient has had congestion & wheezing for a few weeks now.  She stated that it will go away for a few days & then return.

## 2014-10-22 ENCOUNTER — Ambulatory Visit (INDEPENDENT_AMBULATORY_CARE_PROVIDER_SITE_OTHER): Payer: 59 | Admitting: Family Medicine

## 2014-10-22 ENCOUNTER — Telehealth: Payer: Self-pay | Admitting: Family Medicine

## 2014-10-22 VITALS — BP 110/76 | Wt 213.0 lb

## 2014-10-22 DIAGNOSIS — B0229 Other postherpetic nervous system involvement: Secondary | ICD-10-CM

## 2014-10-22 DIAGNOSIS — R339 Retention of urine, unspecified: Secondary | ICD-10-CM | POA: Diagnosis not present

## 2014-10-22 MED ORDER — OXYCODONE-ACETAMINOPHEN 5-325 MG PO TABS
1.0000 | ORAL_TABLET | ORAL | Status: DC | PRN
Start: 2014-10-22 — End: 2014-12-06

## 2014-10-22 NOTE — Telephone Encounter (Signed)
Patient was seen this morning for med check call back this afternoon about her disability from [place] stating they dont want to continue to pay her.I told her they have to contact us its not my responsibility to contact them if they are needing further records on her.  She wanted you to know this and I told her I would send message back but it still their responsibility to get the information they need for her disability determination.

## 2014-10-22 NOTE — Telephone Encounter (Signed)
Pt notified and was told to call Dr. Harrington Challenger. Told her that we are here for her and that she should not go back to work, per Dr. Nicki Reaper.

## 2014-10-22 NOTE — Progress Notes (Signed)
   Subjective:    Patient ID: Kathleen [name], female    DOB: 11-15-1960, 54 y.o.   MRN: [id number]  HPI This patient was seen today for chronic pain  The medication list was reviewed and updated.   -Compliance with pain medication:yes  The patient was advised the importance of maintaining medication and not using illegal substances with these.  Refills needed: yes  The patient was educated that we can provide 3 monthly scripts for their medication, it is their responsibility to follow the instructions.  Side effects or complications from medications:none  Patient is aware that pain medications are meant to minimize the severity of the pain to allow their pain levels to improve to allow for better function. They are aware of that pain medications cannot totally remove their pain.  Due for UDT ( at least once per year) : not needed Uses infrequently  No concerns today  Long discussion held regarding her pulmonary status left hip pain urinary retention she is stable on all these areas still suffers with depression she is disabled. 25 minutes spent with patient today.   Review of Systems  Constitutional: Negative for activity change, appetite change and fatigue.  HENT: Negative for congestion.   Respiratory: Negative for cough.   Cardiovascular: Negative for chest pain.  Gastrointestinal: Negative for abdominal pain.  Endocrine: Negative for polydipsia and polyphagia.  Neurological: Negative for weakness.  Psychiatric/Behavioral: Negative for confusion.       Objective:   Physical Exam  Constitutional: She appears well-nourished. No distress.  Cardiovascular: Normal rate, regular rhythm and normal heart sounds.   No murmur heard. Pulmonary/Chest: Effort normal and breath sounds normal. No respiratory distress.  Musculoskeletal: She exhibits no edema.  Lymphadenopathy:    She has no cervical adenopathy.  Neurological: She is alert. She exhibits normal muscle tone.    Psychiatric: Her behavior is normal.  Vitals reviewed.         Assessment & Plan:  Patient depressed follows with Dr. Harrington Challenger not some suicidal continue medication Pulmonary sarcoidosis stable yet disabled. Follow-up here for any flareups follow-up with her specialists every 6 months  Bladder urinary retention uses catheters at least 2 times a day this will probably never change Patient with postherpetic neuralgia pain uses oxycodone sparingly. Patient to follow-up here 3-4 months

## 2014-11-01 ENCOUNTER — Ambulatory Visit (INDEPENDENT_AMBULATORY_CARE_PROVIDER_SITE_OTHER): Payer: 59 | Admitting: Family Medicine

## 2014-11-01 ENCOUNTER — Encounter: Payer: Self-pay | Admitting: Family Medicine

## 2014-11-01 VITALS — BP 122/78 | Temp 98.3°F | Ht 70.0 in | Wt 211.0 lb

## 2014-11-01 DIAGNOSIS — L723 Sebaceous cyst: Secondary | ICD-10-CM | POA: Diagnosis not present

## 2014-11-01 DIAGNOSIS — D649 Anemia, unspecified: Secondary | ICD-10-CM | POA: Diagnosis not present

## 2014-11-01 NOTE — Progress Notes (Signed)
   Subjective:    Patient ID: Kathleen [name], female    DOB: July 30, 1961, 54 y.o.   MRN: [id number]  HPI Patient is here today d/t cysts.  She has one on the back of her head on her scalp, underneath her right upper arm, and one on her right upper thigh.  She noticed these over the weekend.  They are painful to the touch.   patient very worried about these because she states she never noticed them before   Review of Systems  denies fever chills sweats.    Objective:   Physical Exam   multiple small cysts no more than 3-4 mm one on the scalp one on the right arm one on the right leg none of them red none of them fluid-filled in terms of fluctuance. I don't recommend removal or in drainage      Assessment & Plan:   patient with multiple sebaceous cysts one on the scalp one on the arm one on the leg. None of them appear infected. Their smooth mobile. Subjectively tender. Patient [place] stressed by it. I tried to reassure her. I recommend follow-up 4-6 weeks. If it hasn't changed then more than likely all just be something she lives with. If she continues to feel stressed about it we may have to set her up with surgery. I told the patient I did not recommend having them removed currently.    patient also relates fatigue and tiredness has history of anemia we will check some lab work she may need referral for confusion. She is had a colonoscopy within the past 2 years.  Patient having significant mental health illnesses that are preventing her from being able to return to work

## 2014-11-04 ENCOUNTER — Telehealth: Payer: Self-pay | Admitting: Family Medicine

## 2014-11-04 NOTE — Telephone Encounter (Signed)
Dr. Eliseo Gum is a pulmonologist that see Kathleen [name].  He would like Dr. Nicki Reaper to call him at his earliest convenience when he comes back in the office.  He would like to discuss her condition.

## 2014-11-14 NOTE — Telephone Encounter (Signed)
This patient is currently out on disability main reason is the depression and psychiatric illness. I had spoken with her pulmonologist at [facility] who did not feel that her pulmonary status was severe enough for her to be on disability. The specialist was told on the day he called to call us back. He never did. Try to get this doctor on the line. I am not sure why they are calling.

## 2014-11-15 NOTE — Telephone Encounter (Signed)
[facility]

## 2014-12-04 LAB — CBC WITH DIFFERENTIAL/PLATELET
Basophils Absolute: 0 10*3/uL (ref 0.0–0.2)
Basos: 0 %
Eos: 1 %
Eosinophils Absolute: 0.1 10*3/uL (ref 0.0–0.4)
HCT: 35.6 % (ref 34.0–46.6)
Hemoglobin: 11.5 g/dL (ref 11.1–15.9)
Immature Grans (Abs): 0 10*3/uL (ref 0.0–0.1)
Immature Granulocytes: 0 %
Lymphocytes Absolute: 0.6 10*3/uL — ABNORMAL LOW (ref 0.7–3.1)
Lymphs: 7 %
MCH: 27.6 pg (ref 26.6–33.0)
MCHC: 32.3 g/dL (ref 31.5–35.7)
MCV: 85 fL (ref 79–97)
Monocytes Absolute: 0.4 10*3/uL (ref 0.1–0.9)
Monocytes: 4 %
Neutrophils Absolute: 7.6 10*3/uL — ABNORMAL HIGH (ref 1.4–7.0)
Neutrophils Relative %: 88 %
Platelets: 413 10*3/uL — ABNORMAL HIGH (ref 150–379)
RBC: 4.17 x10E6/uL (ref 3.77–5.28)
RDW: 16.5 % — ABNORMAL HIGH (ref 12.3–15.4)
WBC: 8.7 10*3/uL (ref 3.4–10.8)

## 2014-12-04 LAB — IRON AND TIBC
Iron Saturation: 15 % (ref 15–55)
Iron: 48 ug/dL (ref 27–159)
Total Iron Binding Capacity: 326 ug/dL (ref 250–450)
UIBC: 278 ug/dL (ref 131–425)

## 2014-12-04 LAB — FERRITIN: Ferritin: 16 ng/mL (ref 15–150)

## 2014-12-06 ENCOUNTER — Ambulatory Visit (INDEPENDENT_AMBULATORY_CARE_PROVIDER_SITE_OTHER): Payer: 59 | Admitting: Family Medicine

## 2014-12-06 ENCOUNTER — Encounter: Payer: Self-pay | Admitting: Family Medicine

## 2014-12-06 VITALS — BP 132/82 | Ht 70.0 in | Wt 213.8 lb

## 2014-12-06 DIAGNOSIS — E785 Hyperlipidemia, unspecified: Secondary | ICD-10-CM | POA: Diagnosis not present

## 2014-12-06 DIAGNOSIS — F329 Major depressive disorder, single episode, unspecified: Secondary | ICD-10-CM | POA: Diagnosis not present

## 2014-12-06 DIAGNOSIS — M792 Neuralgia and neuritis, unspecified: Secondary | ICD-10-CM

## 2014-12-06 DIAGNOSIS — B0229 Other postherpetic nervous system involvement: Secondary | ICD-10-CM

## 2014-12-06 DIAGNOSIS — F32A Depression, unspecified: Secondary | ICD-10-CM

## 2014-12-06 DIAGNOSIS — M25552 Pain in left hip: Secondary | ICD-10-CM | POA: Diagnosis not present

## 2014-12-06 MED ORDER — OXYCODONE-ACETAMINOPHEN 5-325 MG PO TABS: 1.0000 | ORAL_TABLET | ORAL | Status: DC | PRN

## 2014-12-06 NOTE — Progress Notes (Signed)
   Subjective:    Patient ID: Kathleen [name], female    DOB: [birth year], 54 y.o.   MRN: [id number]  HPI Patient arrives to follow up on lab work and fatigue.  Raliegh Ip - Hip injection did not help, pt states she cant walk on it, 4 months ago  Prednisone 10 mg daily and Immuran  Patient disagrees with her lungs she feels she cant breath well and feels disabled due to it and other issues  Still with shingles pain  Bladder- caths several times a week  Pain uses Oxycontin intermittently  Dr Harrington Challenger q3 months  Does not feel she can do a part time job  Mild constipation due to meds  Numbness in feet and hands occurs often with sitting on the commode        Review of Systems  Constitutional: Negative for activity change, appetite change and fatigue.  HENT: Negative for congestion.   Respiratory: Negative for cough.   Cardiovascular: Negative for chest pain.  Gastrointestinal: Negative for abdominal pain.  Endocrine: Negative for polydipsia and polyphagia.  Musculoskeletal: Positive for arthralgias and gait problem.  Neurological: Negative for weakness.  Psychiatric/Behavioral: Negative for confusion.       Objective:   Physical Exam  Constitutional: She appears well-nourished. No distress.  Cardiovascular: Normal rate, regular rhythm and normal heart sounds.   No murmur heard. Pulmonary/Chest: Effort normal and breath sounds normal. No respiratory distress.  Musculoskeletal: She exhibits no edema.  Lymphadenopathy:    She has no cervical adenopathy.  Neurological: She is alert. She exhibits normal muscle tone.  Psychiatric: Her behavior is normal.  Vitals reviewed.         Assessment & Plan:  Feet and hands go numb- neurology referral, pay states frequently every week she gets numbness in her hands and in her feet she has seen neurology a couple years ago she feels that neurology can't help her she feels that no one seems to understand. She has requested to  see Dr. Carloyn Manner. She states she has had family members who have seen him for this similar problem and was helped. We will go forward with referral but I cannot guarantee that Dr. Carloyn Manner will be willing to see her.  Left hip pain - pt to call Deirdre Priest this patient that she should go ahead and call and set herself up for follow-up she had an injection 4 weeks ago felt did not help her. She wonders if she needs surgery. She states it causes severe pain when she walks and she has fallen because of  Pulmonary follow up recommended, she is taking her prednisone she states that she will be going back to see the specialist in the near future she feels very frustrated how her lungs are doing she feels that people do not appears to understand how severe she is having trouble.  Her depression is stable yet still having significant troubles. I talked with the patient at length today about trying to learn to move forward despite having all these health issues. She currently does not have much insight in regards to these issues and states that she is not capable of doing any type of work or other activity  Labs were reviewed with the patient.  Refill of oxycodone for chronic pain was given as well

## 2014-12-09 ENCOUNTER — Telehealth (HOSPITAL_COMMUNITY): Payer: Self-pay | Admitting: *Deleted

## 2014-12-09 NOTE — Telephone Encounter (Signed)
lmtcb due to provider being out of office May 9, number provided

## 2014-12-10 ENCOUNTER — Telehealth (HOSPITAL_COMMUNITY): Payer: Self-pay | Admitting: *Deleted

## 2014-12-10 NOTE — Telephone Encounter (Signed)
lmtcb due to provider being out of the office. Number was provided

## 2014-12-21 ENCOUNTER — Telehealth: Payer: Self-pay | Admitting: Family Medicine

## 2014-12-21 NOTE — Telephone Encounter (Signed)
Dr. Rex Kras office will not see the patient unless she is had a recent MRI. We had sent the information to them and they stated they would not see her unless it was a neurosurgical problem. With her having numbness in the hands and the feet when sitting on the commode as she discussed I truly believe that she ought to go ahead and be seen by neurology first. They can try to figure out where the numbness is coming from in then we can always set up an appointment with neurosurgery if there evaluation points toward toward a neurosurgical problem. Please discuss with the patient and allow [place] to set her back up with neurology for this issue first. We are trying to do this in the best interest of the patient. Certainly if neurology reveals a neurosurgical problem the next logical step would be to see Dr. Carloyn Manner.

## 2014-12-22 NOTE — Telephone Encounter (Signed)
Discussed with pt. She will call back with the name of the neurologist that she wants to see.

## 2014-12-22 NOTE — Telephone Encounter (Signed)
[facility]

## 2014-12-26 NOTE — Telephone Encounter (Signed)
Phone call was open in error

## 2014-12-29 ENCOUNTER — Other Ambulatory Visit (HOSPITAL_COMMUNITY): Payer: Self-pay | Admitting: Orthopedic Surgery

## 2014-12-29 DIAGNOSIS — M7062 Trochanteric bursitis, left hip: Secondary | ICD-10-CM

## 2014-12-30 ENCOUNTER — Encounter (HOSPITAL_COMMUNITY): Payer: Self-pay

## 2014-12-30 ENCOUNTER — Encounter (HOSPITAL_COMMUNITY)
Admission: RE | Admit: 2014-12-30 | Discharge: 2014-12-30 | Disposition: A | Discharge status: Home / Self Care | Payer: 59 | Source: Ambulatory Visit | Attending: Internal Medicine | Admitting: Internal Medicine

## 2014-12-30 VITALS — BP 110/70 | HR 60 | Ht 70.0 in | Wt 209.8 lb

## 2014-12-30 DIAGNOSIS — J849 Interstitial pulmonary disease, unspecified: Secondary | ICD-10-CM | POA: Insufficient documentation

## 2014-12-30 NOTE — Progress Notes (Signed)
Patient referred to Pulmonary Rehab by Dr. Farley Ly due to Interstitial Lung Disease.  Dr.  Farley Ly is her pulmonologist and Dr. Wolfgang Phoenix is her PCP.  During orientation advised patient on arrival and appointment times what to wear, what to do before, during and after exercise.  Reviewed attendance and class policy.  Talked about inclement weather and class consultation policy. Patient is scheduled to start cardiac Rehab on Jan 04, 2015 at 1330.  Patient was advised to come to class 15 minutes before class starts.  She was also given instructions on meeting with the dietician and attending the Family Structure classes. Pt is eager to get started.  Pt completed 6 min walk test.

## 2014-12-30 NOTE — Progress Notes (Signed)
Pt has finished orientation and is scheduled to start PR on 01/04/15 at 1330. Pt has been instructed to arrive to class 15 minutes early for scheduled class. Pt has been instructed to wear comfortable clothing and shoes with rubber soles. Pt has been told to take their medications 1 hour prior to coming to class.  If the patient is not going to attend class, she has been instructed to call.

## 2014-12-30 NOTE — Progress Notes (Signed)
Cardiac/Pulmonary Rehab Medication Review by a Pharmacist  Does the patient  feel that his/her medications are working for him/her?  yes  Has the patient been experiencing any side effects to the medications prescribed?  no  Does the patient measure his/her own blood pressure or blood glucose at home?  no   Does the patient have any problems obtaining medications due to transportation or finances?   no  Understanding of regimen: good Understanding of indications: good Potential of compliance: good  Questions asked to Determine Patient Understanding of Medication Regimen:  1. What is the name of the medication?  2. What is the medication used for?  3. When should it be taken?  4. How much should be taken?  5. How will you take it?  6. What side effects should you report?  Understanding Defined as: Excellent: All questions above are correct Good: Questions 1-4 are correct Fair: Questions 1-2 are correct  Poor: 1 or none of the above questions are correct   Pharmacist comments: Pt states she is having hip pain, reported this to MD and is having MRI on Friday.  Pt was told it may be avascular necrosis due to long term Prednisone use.  Otherwise no side effects from medications noted.  Pt has some nerve damage from h/o shingles, uses a catheter at times to empty bladder.    Hart Robinsons A 12/30/2014 3:21 PM

## 2014-12-31 ENCOUNTER — Ambulatory Visit (INDEPENDENT_AMBULATORY_CARE_PROVIDER_SITE_OTHER): Payer: 59 | Admitting: Psychology

## 2014-12-31 ENCOUNTER — Ambulatory Visit (HOSPITAL_COMMUNITY): Payer: Self-pay | Admitting: Psychiatry

## 2014-12-31 ENCOUNTER — Encounter (HOSPITAL_COMMUNITY): Payer: Self-pay | Admitting: Psychology

## 2014-12-31 DIAGNOSIS — F411 Generalized anxiety disorder: Secondary | ICD-10-CM

## 2014-12-31 NOTE — Progress Notes (Signed)
PROGRESS NOTE  Patient:  Kathleen [name]   DOB: Dec 30, 1960  MR Number: [id number]  Location: [facility] [address] [place] [address] Dept: [phone]  Start: 9 AM End: 10 AM  Provider/Observer:     Edgardo Roys PSYD  Chief Complaint:      Chief Complaint  Patient presents with  . Anxiety  . Stress  . Trauma  . Depression    Reason For Service:    The patient was referred back to our office by her treating physicians Sallee Lange, MD do to significant increases in anxiety and depression I seen the patient back in 2003 and again in 2009 for severe depression and anxiety. Significant psychosocial features and stressors were present at that time. The patient also been diagnosed with fibromyalgia and other pain related conditions. 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 She was told 10 years ago according to the patient, that her condition was in a non-active phase. However, she had increasing problems with breathing and even though her doctor at that time did not think it was an active stay Dr. Wolfgang Phoenix set her up to see someone in [place] because of her concerns and his concerns about the situation. She was diagnosed with an active case and had lost 50% of her lung function and needed immune suppressant medications. Since that time, she is developed other subsequent conditions likely as a result of these needed medications. This includes the development of an active case of shingles which is causing her a great deal of pain and bladder difficulties at this point. The patient has been experiencing severe depression and anxiety and constant ruminations about frustration and fear towards past Drs. that the patient felt did not listen to her and her reports of difficulties. These are the patient's impressions and has not had any  communications with previous doctors but it is clear that she is very upset about the permanent loss of lung function and he is enormously grateful to Dr. Wolfgang Phoenix for identifying the need for further intervention and setting her up with the specialist in [facility] who is able to diagnosis and begin treating her condition.  The patient has continued to get treatment for her lung damage/condition.  But she is now having complications from the treatment that is essential including possible issues with hip.  She is also likely having blood flow issues with feet and hands (rhanoids?).   Interventions Strategy:  Cognitive/behavioral psychotherapeutic interventions  Participation Level:   Active  Participation Quality:  Appropriate      Behavioral Observation:  Well Groomed, Alert, and Depressed and Tearful.   Current Psychosocial Factors: The patient returns and describes family stress related to step father dying and family stress from that as well as continued development of new medical issues.  She can not work at all.  Content of Session:   Review current symptoms and continued work on therapeutic interventions.  Current Status:   The patient reports that she has had resurgence of her anxiety and depression and is not able to work due to breathing issues and other medical issues.  Patient Progress:   Stable but continued to be severely depressed.  Target Goals:   Reduce the intensity, frequency, and duration of severe depression.  Last Reviewed:   12/31/2014  Goals Addressed Today:    Worked on issues specifically with her ruminating nature  regarding her severe depression and anxiety.  Impression/Diagnosis:   The patient is clearly overwhelmed by her current situation and significant reduction in physical functioning primarily related to her autoimmune disorder and its significant deleterious effect it is having on her pulmonary function. She has been told she has at least lost 50% of her  lung function and it will not come back and they're trying to keep her from losing more. The patient has a history of significant depression and anxiety in the past as well as traumatic experiences. The patient is severely depressed and anxious at this point and overwhelmed by her status in ruminating about her situation.   Diagnosis:    Axis I: Generalized anxiety disorder        RODENBOUGH,JOHN R, PsyD 12/31/2014    RODENBOUGH,JOHN R, PsyD 12/31/2014

## 2015-01-03 ENCOUNTER — Ambulatory Visit (HOSPITAL_COMMUNITY): Payer: Self-pay | Admitting: Psychiatry

## 2015-01-04 ENCOUNTER — Encounter (HOSPITAL_COMMUNITY)
Admission: RE | Admit: 2015-01-04 | Discharge: 2015-01-04 | Disposition: A | Discharge status: Home / Self Care | Payer: 59 | Source: Ambulatory Visit | Attending: Internal Medicine | Admitting: Internal Medicine

## 2015-01-04 ENCOUNTER — Ambulatory Visit (HOSPITAL_COMMUNITY)
Admission: RE | Admit: 2015-01-04 | Discharge: 2015-01-04 | Disposition: A | Discharge status: Home / Self Care | Payer: 59 | Source: Ambulatory Visit | Attending: Orthopedic Surgery | Admitting: Orthopedic Surgery

## 2015-01-04 DIAGNOSIS — J849 Interstitial pulmonary disease, unspecified: Secondary | ICD-10-CM | POA: Diagnosis not present

## 2015-01-04 DIAGNOSIS — M25551 Pain in right hip: Secondary | ICD-10-CM | POA: Diagnosis not present

## 2015-01-04 DIAGNOSIS — M25559 Pain in unspecified hip: Secondary | ICD-10-CM | POA: Diagnosis present

## 2015-01-04 DIAGNOSIS — M25552 Pain in left hip: Secondary | ICD-10-CM | POA: Diagnosis not present

## 2015-01-04 DIAGNOSIS — M7062 Trochanteric bursitis, left hip: Secondary | ICD-10-CM

## 2015-01-06 ENCOUNTER — Ambulatory Visit: Payer: 59 | Admitting: Family Medicine

## 2015-01-06 ENCOUNTER — Encounter (HOSPITAL_COMMUNITY): Payer: 59

## 2015-01-06 DIAGNOSIS — Z029 Encounter for administrative examinations, unspecified: Secondary | ICD-10-CM

## 2015-01-10 ENCOUNTER — Telehealth: Payer: Self-pay | Admitting: Family Medicine

## 2015-01-10 NOTE — Telephone Encounter (Signed)
Pt would like to discuss her MRI results with you sometime In the near future. She'll do an appt or a phone call please

## 2015-01-11 ENCOUNTER — Encounter (HOSPITAL_COMMUNITY)
Admission: RE | Admit: 2015-01-11 | Discharge: 2015-01-11 | Disposition: A | Discharge status: Home / Self Care | Payer: 59 | Source: Ambulatory Visit | Attending: Internal Medicine | Admitting: Internal Medicine

## 2015-01-11 DIAGNOSIS — J849 Interstitial pulmonary disease, unspecified: Secondary | ICD-10-CM | POA: Diagnosis not present

## 2015-01-11 NOTE — Telephone Encounter (Signed)
ntc ( in all due respect) if just a few minutes I can call her if for the length of an OV 10 to 15 min then I rec OV

## 2015-01-11 NOTE — Telephone Encounter (Signed)
Patient scheduled office visit to discuss MRI

## 2015-01-11 NOTE — Progress Notes (Signed)
Pulmonary Rehabilitation Program Outcomes Report   Orientation:  12/30/14 Graduate Date:  tbd Discharge Date:  tbd # of sessions completed: 3  Pulmonologist: Farley Ly Family MD:  Wolfgang Phoenix Class Time:  1330  A.  Exercise Program:  Tolerates exercise @ 3.62 METS for 15 minutes and Walk Test Results:  Pre: 2.17 mets  B.  Mental Health:  Good mental attitude  C.  Education/Instruction/Skills  Uses Perceived Exertion Scale and/or Dyspnea Scale  Demonstrates accurate pursed lip breathing  D.  Nutrition/Weight Control/Body Composition:  Adherence to prescribed nutrition program: fair    E.  Blood Lipids    Lab Results  Component Value Date   CHOL 205* 08/26/2014   HDL 51 08/26/2014   LDLCALC 123* 08/26/2014   TRIG 156* 08/26/2014   CHOLHDL 4.0 08/26/2014    F.  Lifestyle Changes:  Making positive lifestyle changes  G.  Symptoms noted with exercise:  Asymptomatic  Report Completed By:  Stevphen Rochester RN   Comments:  This is patients first week progress note in [facility].

## 2015-01-13 ENCOUNTER — Encounter (HOSPITAL_COMMUNITY)
Admission: RE | Admit: 2015-01-13 | Discharge: 2015-01-13 | Disposition: A | Discharge status: Home / Self Care | Payer: 59 | Source: Ambulatory Visit | Attending: Internal Medicine | Admitting: Internal Medicine

## 2015-01-13 DIAGNOSIS — J849 Interstitial pulmonary disease, unspecified: Secondary | ICD-10-CM | POA: Diagnosis not present

## 2015-01-18 ENCOUNTER — Encounter (HOSPITAL_COMMUNITY)
Admission: RE | Admit: 2015-01-18 | Discharge: 2015-01-18 | Disposition: A | Discharge status: Home / Self Care | Payer: 59 | Source: Ambulatory Visit | Attending: Internal Medicine | Admitting: Internal Medicine

## 2015-01-18 DIAGNOSIS — J849 Interstitial pulmonary disease, unspecified: Secondary | ICD-10-CM | POA: Diagnosis not present

## 2015-01-20 ENCOUNTER — Ambulatory Visit: Payer: Self-pay | Admitting: Family Medicine

## 2015-01-20 ENCOUNTER — Encounter (HOSPITAL_COMMUNITY): Payer: 59

## 2015-01-25 ENCOUNTER — Encounter (HOSPITAL_COMMUNITY): Payer: 59

## 2015-01-25 ENCOUNTER — Ambulatory Visit (INDEPENDENT_AMBULATORY_CARE_PROVIDER_SITE_OTHER): Payer: 59 | Admitting: Family Medicine

## 2015-01-25 VITALS — BP 118/74 | Ht 70.0 in | Wt 213.0 lb

## 2015-01-25 DIAGNOSIS — M87052 Idiopathic aseptic necrosis of left femur: Secondary | ICD-10-CM | POA: Diagnosis not present

## 2015-01-25 MED ORDER — OXYCODONE-ACETAMINOPHEN 5-325 MG PO TABS: 1.0000 | ORAL_TABLET | ORAL | Status: DC | PRN

## 2015-01-25 MED ORDER — PREDNISONE 5 MG PO TABS
ORAL_TABLET | ORAL | Status: DC
Start: 2015-01-25 — End: 2015-02-11

## 2015-01-25 MED ORDER — PREDNISONE 5 MG PO TABS: ORAL_TABLET | ORAL | Status: DC

## 2015-01-25 NOTE — Progress Notes (Signed)
   Subjective:    Patient ID: Kathleen [name], female    DOB: December 02, 1960, 54 y.o.   MRN: [id number]  HPIPt arrives today to discuss MRI results.   Pt wants to discuss her meds. Was told she would have to come off all meds 2 -3 prior to surgery and 3 weeks after.   We reviewed over her MRI results we reviewed over how her hip is feeling and over treatment options including surgery [place] versus at [facility]    Review of Systems     Objective:   Physical Exam        Assessment & Plan:  Approximately 35 to 40 minutes spent with the patient regarding this issue. Her specialists Dr. Farley Ly pulmonology [facility] was spoken with. He will go ahead and have orthopedics see the patient for this issue.  Patient can go ahead and start decreasing prednisone she has been taking 10 mg daily. Her pulmonologist was under the impression she was taking 5 milligrams daily. I have recommended to the patient to go to 7.5 mg daily for the next 2 weeks. Then we will go to a gradual taper over the next 8 weeks.  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  A copy of that taper instructions were scanned into the system. She has 5 mg tablet. She will use 1-1/2 daily for 2 weeks. Then the following 2 weeks she will do one and a half every other day with 1 tablet every other day in between. Then for the next 2 weeks she will do 5 mg once daily then for the following 2 weeks half a tablet every other day with 1 tablet every other day in between. Finally a half a tablet will be done until she follows up in [place]. Discussion was held with her pulmonologist who agreed with this plan. A written explanation was given to the patient.

## 2015-01-27 ENCOUNTER — Encounter (HOSPITAL_COMMUNITY): Payer: 59

## 2015-02-01 ENCOUNTER — Encounter (HOSPITAL_COMMUNITY): Payer: 59

## 2015-02-03 ENCOUNTER — Encounter (HOSPITAL_COMMUNITY): Payer: 59

## 2015-02-08 ENCOUNTER — Encounter (HOSPITAL_COMMUNITY): Payer: 59

## 2015-02-09 ENCOUNTER — Telehealth: Payer: Self-pay | Admitting: Family Medicine

## 2015-02-09 ENCOUNTER — Encounter (HOSPITAL_COMMUNITY): Payer: Self-pay

## 2015-02-09 ENCOUNTER — Emergency Department (HOSPITAL_COMMUNITY)
Admission: EM | Admit: 2015-02-09 | Discharge: 2015-02-09 | Disposition: A | Discharge status: Home / Self Care | Payer: 59 | Attending: Emergency Medicine | Admitting: Emergency Medicine

## 2015-02-09 ENCOUNTER — Emergency Department (HOSPITAL_COMMUNITY): Payer: 59

## 2015-02-09 DIAGNOSIS — W19XXXA Unspecified fall, initial encounter: Secondary | ICD-10-CM

## 2015-02-09 DIAGNOSIS — S99911A Unspecified injury of right ankle, initial encounter: Secondary | ICD-10-CM | POA: Diagnosis present

## 2015-02-09 DIAGNOSIS — Y998 Other external cause status: Secondary | ICD-10-CM | POA: Diagnosis not present

## 2015-02-09 DIAGNOSIS — Y9289 Other specified places as the place of occurrence of the external cause: Secondary | ICD-10-CM | POA: Diagnosis not present

## 2015-02-09 DIAGNOSIS — Y9389 Activity, other specified: Secondary | ICD-10-CM | POA: Diagnosis not present

## 2015-02-09 DIAGNOSIS — Z8742 Personal history of other diseases of the female genital tract: Secondary | ICD-10-CM | POA: Insufficient documentation

## 2015-02-09 DIAGNOSIS — S8264XA Nondisplaced fracture of lateral malleolus of right fibula, initial encounter for closed fracture: Secondary | ICD-10-CM | POA: Diagnosis not present

## 2015-02-09 DIAGNOSIS — S82401A Unspecified fracture of shaft of right fibula, initial encounter for closed fracture: Secondary | ICD-10-CM

## 2015-02-09 DIAGNOSIS — M797 Fibromyalgia: Secondary | ICD-10-CM | POA: Diagnosis not present

## 2015-02-09 DIAGNOSIS — K219 Gastro-esophageal reflux disease without esophagitis: Secondary | ICD-10-CM | POA: Insufficient documentation

## 2015-02-09 DIAGNOSIS — Z79899 Other long term (current) drug therapy: Secondary | ICD-10-CM | POA: Diagnosis not present

## 2015-02-09 DIAGNOSIS — D649 Anemia, unspecified: Secondary | ICD-10-CM | POA: Diagnosis not present

## 2015-02-09 DIAGNOSIS — W010XXA Fall on same level from slipping, tripping and stumbling without subsequent striking against object, initial encounter: Secondary | ICD-10-CM | POA: Insufficient documentation

## 2015-02-09 DIAGNOSIS — Z8679 Personal history of other diseases of the circulatory system: Secondary | ICD-10-CM | POA: Insufficient documentation

## 2015-02-09 MED ORDER — LORAZEPAM 2 MG/ML IJ SOLN
1.0000 mg | Freq: Once | INTRAMUSCULAR | Status: AC
  Administered 2015-02-09: 1 mg via INTRAVENOUS
  Filled 2015-02-09: qty 1

## 2015-02-09 MED ORDER — HYDROCODONE-ACETAMINOPHEN 5-325 MG PO TABS: 1.0000 | ORAL_TABLET | ORAL | Status: DC | PRN

## 2015-02-09 MED ORDER — HYDROMORPHONE HCL 1 MG/ML IJ SOLN
1.0000 mg | Freq: Once | INTRAMUSCULAR | Status: AC
  Administered 2015-02-09: 1 mg via INTRAVENOUS
  Filled 2015-02-09: qty 1

## 2015-02-09 MED ORDER — KETOROLAC TROMETHAMINE 30 MG/ML IJ SOLN: 15.0000 mg | Freq: Once | INTRAMUSCULAR | Status: DC

## 2015-02-09 MED ORDER — HYDROMORPHONE HCL 1 MG/ML IJ SOLN
1.0000 mg | Freq: Once | INTRAMUSCULAR | Status: DC
Start: 2015-02-09 — End: 2015-02-09

## 2015-02-09 MED ORDER — KETOROLAC TROMETHAMINE 30 MG/ML IJ SOLN
15.0000 mg | Freq: Once | INTRAMUSCULAR | Status: AC
  Administered 2015-02-09: 15 mg via INTRAVENOUS
  Filled 2015-02-09: qty 1

## 2015-02-09 MED ORDER — LORAZEPAM 2 MG/ML IJ SOLN: 1.0000 mg | Freq: Once | INTRAMUSCULAR | Status: DC

## 2015-02-09 NOTE — ED Notes (Signed)
Family at bedside. Pt's daughter came out and asked for more pain medication on behalf of patient. Explained to pt, husband, and daughter that more pain medication could not be given until after assessment by ED MD. Family verbalized understanding. Pt's foot continues to be elevated with ice packs on it.

## 2015-02-09 NOTE — ED Notes (Signed)
Pt states she tripped on a rug and injured her right ankle. Pt was given morphine 8 mg IV prior to arrival

## 2015-02-09 NOTE — Discharge Instructions (Signed)
Cast or Splint Care °Casts and splints support injured limbs and keep bones from moving while they heal. It is important to care for your cast or splint at home.   °HOME CARE INSTRUCTIONS °· Keep the cast or splint uncovered during the drying period. It can take 24 to 48 hours to dry if it is made of plaster. A fiberglass cast will dry in less than 1 hour. °· Do not rest the cast on anything harder than a pillow for the first 24 hours. °· Do not put weight on your injured limb or apply pressure to the cast until your health care provider gives you permission. °· Keep the cast or splint dry. Wet casts or splints can lose their shape and may not support the limb as well. A wet cast that has lost its shape can also create harmful pressure on your skin when it dries. Also, wet skin can become infected. °¨ Cover the cast or splint with a plastic bag when bathing or when out in the rain or snow. If the cast is on the trunk of the body, take sponge baths until the cast is removed. °¨ If your cast does become wet, dry it with a towel or a blow dryer on the cool setting only. °· Keep your cast or splint clean. Soiled casts may be wiped with a moistened cloth. °· Do not place any hard or soft foreign objects under your cast or splint, such as cotton, toilet paper, lotion, or powder. °· Do not try to scratch the skin under the cast with any object. The object could get stuck inside the cast. Also, scratching could lead to an infection. If itching is a problem, use a blow dryer on a cool setting to relieve discomfort. °· Do not trim or cut your cast or remove padding from inside of it. °· Exercise all joints next to the injury that are not immobilized by the cast or splint. For example, if you have a long leg cast, exercise the hip joint and toes. If you have an arm cast or splint, exercise the shoulder, elbow, thumb, and fingers. °· Elevate your injured arm or leg on 1 or 2 pillows for the first 1 to 3 days to decrease  swelling and pain. It is best if you can comfortably elevate your cast so it is higher than your heart. °SEEK MEDICAL CARE IF:  °· Your cast or splint cracks. °· Your cast or splint is too tight or too loose. °· You have unbearable itching inside the cast. °· Your cast becomes wet or develops a soft spot or area. °· You have a bad smell coming from inside your cast. °· You get an object stuck under your cast. °· Your skin around the cast becomes red or raw. °· You have new pain or worsening pain after the cast has been applied. °SEEK IMMEDIATE MEDICAL CARE IF:  °· You have fluid leaking through the cast. °· You are unable to move your fingers or toes. °· You have discolored (blue or white), cool, painful, or very swollen fingers or toes beyond the cast. °· You have tingling or numbness around the injured area. °· You have severe pain or pressure under the cast. °· You have any difficulty with your breathing or have shortness of breath. °· You have chest pain. °Document Released: 08/10/2000 Document Revised: 06/03/2013 Document Reviewed: 02/19/2013 °ExitCare® Patient Information ©2015 [employer]. This information is not intended to replace advice given to you by your health care   provider. Make sure you discuss any questions you have with your health care provider. ° °

## 2015-02-09 NOTE — ED Notes (Addendum)
Pt keeps stating, "please stop the pain please stop the pain. Just knock me out." Explained to the patient that the MD has to assess patient before more pain medication can be given. Pt continues to ask every person that enters the room for pain medication. Pt given 8mg  IV Morphine by EMS. States it did not even touch the pain.

## 2015-02-09 NOTE — ED Notes (Signed)
MD Kohut at bedside. 

## 2015-02-09 NOTE — Telephone Encounter (Signed)
FYI - pt's daughter called, pt is in the hospital at [facility], fell down some stairs and is in a lot of pain, pt wanted her daughter to make sure that Dr. Nicki Reaper knew.

## 2015-02-10 ENCOUNTER — Emergency Department (HOSPITAL_BASED_OUTPATIENT_CLINIC_OR_DEPARTMENT_OTHER): Payer: 59

## 2015-02-10 ENCOUNTER — Emergency Department (HOSPITAL_BASED_OUTPATIENT_CLINIC_OR_DEPARTMENT_OTHER)
Admission: EM | Admit: 2015-02-10 | Discharge: 2015-02-11 | Disposition: A | Discharge status: Home / Self Care | Payer: 59 | Attending: Emergency Medicine | Admitting: Emergency Medicine

## 2015-02-10 ENCOUNTER — Encounter: Payer: Self-pay | Admitting: Orthopedic Surgery

## 2015-02-10 ENCOUNTER — Encounter (HOSPITAL_BASED_OUTPATIENT_CLINIC_OR_DEPARTMENT_OTHER): Payer: Self-pay | Admitting: *Deleted

## 2015-02-10 ENCOUNTER — Ambulatory Visit (INDEPENDENT_AMBULATORY_CARE_PROVIDER_SITE_OTHER): Payer: 59 | Admitting: Orthopedic Surgery

## 2015-02-10 ENCOUNTER — Telehealth: Payer: Self-pay | Admitting: *Deleted

## 2015-02-10 ENCOUNTER — Encounter (HOSPITAL_COMMUNITY): Payer: 59

## 2015-02-10 VITALS — BP 106/61 | Ht 70.0 in | Wt 200.0 lb

## 2015-02-10 DIAGNOSIS — Z8742 Personal history of other diseases of the female genital tract: Secondary | ICD-10-CM | POA: Diagnosis not present

## 2015-02-10 DIAGNOSIS — Z8679 Personal history of other diseases of the circulatory system: Secondary | ICD-10-CM | POA: Diagnosis not present

## 2015-02-10 DIAGNOSIS — S82891A Other fracture of right lower leg, initial encounter for closed fracture: Secondary | ICD-10-CM | POA: Diagnosis not present

## 2015-02-10 DIAGNOSIS — Z79899 Other long term (current) drug therapy: Secondary | ICD-10-CM | POA: Diagnosis not present

## 2015-02-10 DIAGNOSIS — K219 Gastro-esophageal reflux disease without esophagitis: Secondary | ICD-10-CM | POA: Diagnosis not present

## 2015-02-10 DIAGNOSIS — S82831D Other fracture of upper and lower end of right fibula, subsequent encounter for closed fracture with routine healing: Secondary | ICD-10-CM | POA: Diagnosis not present

## 2015-02-10 DIAGNOSIS — S99921D Unspecified injury of right foot, subsequent encounter: Secondary | ICD-10-CM | POA: Diagnosis present

## 2015-02-10 DIAGNOSIS — W010XXD Fall on same level from slipping, tripping and stumbling without subsequent striking against object, subsequent encounter: Secondary | ICD-10-CM | POA: Insufficient documentation

## 2015-02-10 DIAGNOSIS — M797 Fibromyalgia: Secondary | ICD-10-CM | POA: Insufficient documentation

## 2015-02-10 DIAGNOSIS — D649 Anemia, unspecified: Secondary | ICD-10-CM | POA: Diagnosis not present

## 2015-02-10 DIAGNOSIS — S82401D Unspecified fracture of shaft of right fibula, subsequent encounter for closed fracture with routine healing: Secondary | ICD-10-CM

## 2015-02-10 MED ORDER — MORPHINE SULFATE 4 MG/ML IJ SOLN
8.0000 mg | Freq: Once | INTRAMUSCULAR | Status: AC
  Administered 2015-02-10: 8 mg via INTRAMUSCULAR
  Filled 2015-02-10: qty 2

## 2015-02-10 MED ORDER — HYDROCODONE-ACETAMINOPHEN 10-325 MG PO TABS: 1.0000 | ORAL_TABLET | ORAL | Status: DC | PRN

## 2015-02-10 NOTE — Telephone Encounter (Signed)
Patient called this evening stating her ankle is hurting really bad, advised patient that she would have some increased pain since the foot was moved and bent some during splint application, advised we also gave her a higher dose of pain medication, patient asked if she should loosen ace bandages and i advised no, leave splint in place

## 2015-02-10 NOTE — Discharge Instructions (Signed)
Please call Dr. Ruthe Mannan office in the morning for follow up asap.

## 2015-02-10 NOTE — ED Notes (Signed)
Foot injury. She broke her foot yesterday and was seen by an orthopedist in [place] today. He said without a CT he could not help her. She is here for pain medication. Does not know if the orthopedist made an appointment for the CT.

## 2015-02-10 NOTE — Progress Notes (Signed)
Patient ID: Kathleen [name], female   DOB: 09-19-1960, 54 y.o.   MRN: [id number] Patient ID: Kathleen [name], female   DOB: 1960-09-25, 53 y.o.   MRN: [id number] New fracture     Chief Complaint  Patient presents with  . Follow-up    er follow up Right lateral malleolar fracture, DOI 02/09/15     Kathleen [name] is a 54 y.o. female.   HPI 54 year old female with sarcoidosis and avascular necrosis left hip fell at home injured right ankle. Initial films show what appears to be a relatively nondisplaced lateral malleolus fracture. However, at the distal tibial area there appears to be a double density sign and she will need a CT scan to clarify she has a posterior malleolar fracture and if she indeed had a fracture dislocation of the ankle with spontaneous reduction. Her pain is out of proportion to what the x-rays show and she has a significant amount of swelling. Decreased range of motion is also noted. She does not complain of numbness or tingling. She has shortness of breath. Review of Systems See hpi  Past Medical History  Diagnosis Date  . Sarcoidosis   . Breast fibrocystic disorder   . Mitral valve prolapse   . Breast pain     right  . Normal stress echocardiogram Jan 2011  . Normal nuclear stress test December 2012  . Normal echocardiogram December 2012    EF 55 to 60%; mild MVP, grade 1 diastolic dysfunction, PAP 32  . Fibromyalgia   . DOE (dyspnea on exertion)   . Pulmonary fibrosis   . Gastroesophageal reflux disease   . Heart disease   . Anemia     Past Surgical History  Procedure Laterality Date  . Tubal ligation    . Breast lumpectomy  01/12/2011    right    Family History  Problem Relation Age of Onset  . Cardiomyopathy Mother   . Breast cancer Mother   . Heart disease Maternal Aunt   . Prostate cancer Father   . Colon cancer Neg Hx   . Neurofibromatosis Brother   . Bipolar disorder Daughter     Social History History  Substance Use Topics  . Smoking  status: Never Smoker   . Smokeless tobacco: Never Used  . Alcohol Use: No    Allergies  Allergen Reactions  . Bee Venom Anaphylaxis  . Benadryl [Diphenhydramine Hcl] Nausea Only and Swelling    "makes my throat close up" "makes my throat close up"  . Codeine Itching    Other reaction(s): ITCHING  . Diphenhydramine Nausea Only    "makes my throat close up" "makes my throat close up"  . Morphine And Related Other (See Comments)    Unknown reaction- pt gets sick, dizzy, and confused    Current Outpatient Prescriptions  Medication Sig Dispense Refill  . albuterol (PROVENTIL) (2.5 MG/3ML) 0.083% nebulizer solution Take 2.5 mg by nebulization every 6 (six) hours as needed for wheezing or shortness of breath.    . ALPRAZolam (XANAX) 1 MG tablet Take 0.5-1 tablets (0.5-1 mg total) by mouth 3 (three) times daily. Takes one half tablet twice daily and one tablet at bedtime (Patient taking differently: Take 0.5-1 mg by mouth 3 (three) times daily as needed for anxiety or sleep (Pt takes as needed during the day and every night for sleep.). Takes one half tablet twice daily and one tablet at bedtime) 90 tablet 2  . azaTHIOprine (IMURAN) 50 MG tablet Take 100 mg  by mouth every morning.     Marland Kitchen buPROPion (WELLBUTRIN SR) 150 MG 12 hr tablet Take 1 tablet (150 mg total) by mouth 2 (two) times daily. (Patient taking differently: Take 150 mg by mouth daily. ) 180 tablet 2  . chlorzoxazone (PARAFON FORTE DSC) 500 MG tablet Take 1 tablet (500 mg total) by mouth 3 (three) times daily as needed for muscle spasms. 40 tablet 4  . DULoxetine (CYMBALTA) 60 MG capsule Take 1 capsule (60 mg total) by mouth 2 (two) times daily. 180 capsule 2  . EPINEPHrine 0.3 mg/0.3 mL IJ SOAJ injection Inject 0.3 mLs (0.3 mg total) into the muscle once. (Patient taking differently: Inject 0.3 mg into the muscle daily as needed (carries on person in case of BEE STING.). ) 1 Device 5  . esomeprazole (NEXIUM) 40 MG capsule Take 1  capsule (40 mg total) by mouth daily. 90 capsule 3  . famotidine (PEPCID) 20 MG tablet Take 20 mg by mouth 2 (two) times daily as needed for heartburn or indigestion.    . ferrous sulfate 325 (65 FE) MG EC tablet Take 325 mg by mouth daily.    Marland Kitchen gabapentin (NEURONTIN) 300 MG capsule TAKE 1 CAPSULE BY MOUTH 3 TIMES DAILY. (Patient taking differently: TAKE 1 CAPSULE BY MOUTH 3 TIMES DAILY PRN) 270 capsule 1  . HYDROcodone-acetaminophen (NORCO) 10-325 MG per tablet Take 1 tablet by mouth every 4 (four) hours as needed. 42 tablet 0  . HYDROcodone-acetaminophen (NORCO/VICODIN) 5-325 MG per tablet Take 1-2 tablets by mouth every 4 (four) hours as needed. 20 tablet 0  . ondansetron (ZOFRAN) 8 MG tablet Take 1 tablet (8 mg total) by mouth every 8 (eight) hours as needed for nausea. (Patient not taking: Reported on 02/09/2015) 20 tablet 1  . oxyCODONE-acetaminophen (PERCOCET/ROXICET) 5-325 MG per tablet Take 1 tablet by mouth every 4 (four) hours as needed for moderate pain or severe pain. 40 tablet 0  . predniSONE (DELTASONE) 5 MG tablet Take one and a half daily (Patient taking differently: Take 5 mg by mouth daily. Take one and a half daily) 45 tablet 5   No current facility-administered medications for this visit.       Physical Exam Blood pressure 106/61, height 5\' 10"  (1.778 m), weight 200 lb (90.719 kg), last menstrual period 02/02/2015. Physical Exam The patient is well developed well nourished and well groomed. Orientation to person place and time is normal  Mood is pleasant. Ambulatory status she is really basically confined to a wheelchair except for pivoting on the left leg which has avascular necrosis Right ankle is tender medially laterally posteriorly and anteriorly most the swelling is anterior and lateral skin is ecchymotic and bruised she has normal soft touch good distal pulse normal capillary refill could not test ankle stability muscle tone was normal. There was no lymph node  enlargement.  Data Reviewed The radiographs have been independently reviewed and I gave my interpretation in the history  Assessment Encounter Diagnosis  Name Primary?  Marland Kitchen Ankle fracture, right Yes    Plan splint applied  sent patient for CT scan  increased pain medicine to 10 mg hydrocodone

## 2015-02-10 NOTE — ED Provider Notes (Signed)
CSN: [id number]     Arrival date & time 02/10/15  2100 History  This chart was scribed for Pattricia Boss, MD by Julien Nordmann, ED Scribe. This patient was seen in room [facility]1/[facility]1 and the patient's care was started at 10:02 PM.    Chief Complaint  Patient presents with  . Foot Injury      The history is provided by the patient. No language interpreter was used.    HPI Comments: Kathleen [name] is a 54 y.o. female who presents to the Emergency Department complaining of a foot injury that occurred yesterday. Pt notes she tripped on a rug yesterday and injured her right ankle. She was seen yesterday in the ED for her injury and was diagnosed with a broken foot. She was also seen by an orthopedist earlier today and noted without a CT he could not help her. She is taking pain medication to alleviate the pain with minimal relief. Right lower extremity is in splint.   Past Medical History  Diagnosis Date  . Sarcoidosis   . Breast fibrocystic disorder   . Mitral valve prolapse   . Breast pain     right  . Normal stress echocardiogram Jan 2011  . Normal nuclear stress test December 2012  . Normal echocardiogram December 2012    EF 55 to 60%; mild MVP, grade 1 diastolic dysfunction, PAP 32  . Fibromyalgia   . DOE (dyspnea on exertion)   . Pulmonary fibrosis   . Gastroesophageal reflux disease   . Heart disease   . Anemia    Past Surgical History  Procedure Laterality Date  . Tubal ligation    . Breast lumpectomy  01/12/2011    right   Family History  Problem Relation Age of Onset  . Cardiomyopathy Mother   . Breast cancer Mother   . Heart disease Maternal Aunt   . Prostate cancer Father   . Colon cancer Neg Hx   . Neurofibromatosis Brother   . Bipolar disorder Daughter    History  Substance Use Topics  . Smoking status: Never Smoker   . Smokeless tobacco: Never Used  . Alcohol Use: No   OB History    No data available     Review of Systems  Musculoskeletal: Positive for  joint swelling.  All other systems reviewed and are negative.     Allergies  Bee venom; Benadryl; Codeine; Diphenhydramine; and Morphine and related  Home Medications   Prior to Admission medications   Medication Sig Start Date End Date Taking? Authorizing Provider  albuterol (PROVENTIL) (2.5 MG/3ML) 0.083% nebulizer solution Take 2.5 mg by nebulization every 6 (six) hours as needed for wheezing or shortness of breath.    Historical Provider, MD  ALPRAZolam Duanne Moron) 1 MG tablet Take 0.5-1 tablets (0.5-1 mg total) by mouth 3 (three) times daily. Takes one half tablet twice daily and one tablet at bedtime Patient taking differently: Take 0.5-1 mg by mouth 3 (three) times daily as needed for anxiety or sleep (Pt takes as needed during the day and every night for sleep.). Takes one half tablet twice daily and one tablet at bedtime 10/05/14   Cloria Spring, MD  azaTHIOprine (IMURAN) 50 MG tablet Take 100 mg by mouth every morning.     Historical Provider, MD  buPROPion (WELLBUTRIN SR) 150 MG 12 hr tablet Take 1 tablet (150 mg total) by mouth 2 (two) times daily. Patient taking differently: Take 150 mg by mouth daily.  10/05/14   Neoma Laming  Trixie Dredge, MD  chlorzoxazone (PARAFON FORTE DSC) 500 MG tablet Take 1 tablet (500 mg total) by mouth 3 (three) times daily as needed for muscle spasms. 01/22/14   [name], MD  DULoxetine (CYMBALTA) 60 MG capsule Take 1 capsule (60 mg total) by mouth 2 (two) times daily. 10/05/14 10/05/15  Cloria Spring, MD  EPINEPHrine 0.3 mg/0.3 mL IJ SOAJ injection Inject 0.3 mLs (0.3 mg total) into the muscle once. Patient taking differently: Inject 0.3 mg into the muscle daily as needed (carries on person in case of BEE STING.).  02/18/14   [name], MD  esomeprazole (NEXIUM) 40 MG capsule Take 1 capsule (40 mg total) by mouth daily. 08/25/14   [name], MD  famotidine (PEPCID) 20 MG tablet Take 20 mg by mouth 2 (two) times daily as needed for heartburn or indigestion.     Historical Provider, MD  ferrous sulfate 325 (65 FE) MG EC tablet Take 325 mg by mouth daily.    Historical Provider, MD  gabapentin (NEURONTIN) 300 MG capsule TAKE 1 CAPSULE BY MOUTH 3 TIMES DAILY. Patient taking differently: TAKE 1 CAPSULE BY MOUTH 3 TIMES DAILY PRN 06/24/14   [name], MD  HYDROcodone-acetaminophen (NORCO) 10-325 MG per tablet Take 1 tablet by mouth every 4 (four) hours as needed. 02/10/15   Carole Civil, MD  HYDROcodone-acetaminophen (NORCO/VICODIN) 5-325 MG per tablet Take 1-2 tablets by mouth every 4 (four) hours as needed. 02/09/15   Virgel Manifold, MD  ondansetron (ZOFRAN) 8 MG tablet Take 1 tablet (8 mg total) by mouth every 8 (eight) hours as needed for nausea. Patient not taking: Reported on 02/09/2015 03/15/14   [name], MD  oxyCODONE-acetaminophen (PERCOCET/ROXICET) 5-325 MG per tablet Take 1 tablet by mouth every 4 (four) hours as needed for moderate pain or severe pain. 01/25/15   [name], MD  predniSONE (DELTASONE) 5 MG tablet Take one and a half daily Patient taking differently: Take 5 mg by mouth daily. Take one and a half daily 01/25/15   [name], MD   Triage vitals: BP 108/54 mmHg  Pulse 77  Temp(Src) 98.1 F (36.7 C) (Oral)  Resp 18  Ht 5\' 10"  (1.778 m)  Wt 200 lb (90.719 kg)  BMI 28.70 kg/m2  SpO2 96%  LMP 02/02/2015 Physical Exam  Constitutional: She is oriented to person, place, and time. She appears well-developed and well-nourished.  HENT:  Head: Normocephalic and atraumatic.  Right Ear: External ear normal.  Left Ear: External ear normal.  Nose: Nose normal.  Mouth/Throat: Oropharynx is clear and moist.  Eyes: Conjunctivae and EOM are normal. Pupils are equal, round, and reactive to light.  Neck: Normal range of motion. Neck supple. No JVD present. No tracheal deviation present. No thyromegaly present.  Cardiovascular: Normal rate, regular rhythm, normal heart sounds and intact distal pulses.   Pulmonary/Chest:  Effort normal and breath sounds normal. She has no wheezes.  Abdominal: Soft. Bowel sounds are normal. She exhibits no mass. There is no tenderness. There is no guarding.  Musculoskeletal: Normal range of motion.       Feet:  Toes pink cap refill normal.  NOrmal movememnt foot and toes.  NO ttp proximal to injury.  Swelling and ttp as noted on diagram.  Skin is intact.   Lymphadenopathy:    She has no cervical adenopathy.  Neurological: She is alert and oriented to person, place, and time. She has normal reflexes. No cranial nerve  deficit or sensory deficit. Gait normal. GCS eye subscore is 4. GCS verbal subscore is 5. GCS motor subscore is 6.  Reflex Scores:      Bicep reflexes are 2+ on the right side and 2+ on the left side.      Patellar reflexes are 2+ on the right side and 2+ on the left side. Cranial nerves grossly intact. Patient fluent.   Skin: Skin is warm and dry.  Psychiatric: She has a normal mood and affect. Her behavior is normal. Judgment and thought content normal.  Nursing note and vitals reviewed.   ED Course  Procedures  DIAGNOSTIC STUDIES:   COORDINATION OF CARE:  10:08 PM Discussed treatment plan which includes follow up with another specialist with pt at bedside and pt agreed to plan.  Labs Review Labs Reviewed - No data to display  Imaging Review Dg Ankle 2 Views Right  02/09/2015   CLINICAL DATA:  Tripped on rug. Fall. Right ankle pain and decreased range of motion. Initial encounter.  EXAM: RIGHT ANKLE - 2 VIEW  COMPARISON:  None.  FINDINGS: Nondisplaced oblique fracture of the lateral malleolus is seen just above the level of the tibial plafond. No other ankle fractures identified. No evidence of dislocation.  IMPRESSION: Nondisplaced lateral malleolar fracture, just superior to level of tibial plafond.   Electronically Signed   By: Earle Gell M.D.   On: 02/09/2015 16:01     EKG Interpretation None      MDM   Final diagnoses:  Closed fracture of  right fibula, with routine healing, subsequent encounter   Reviewed prior x-rays adn notes.  CT ordered at patient request and after noting that Dr. Aline Brochure had ordered one.  Splint replaced as broken andpadding added.  Patient advised regarding additional comfort measures- cold therapy, elevation. She has norco at home and additional im ms given here. I personally performed the services described in this documentation, which was scribed in my presence. The recorded information has been reviewed and considered.   Pattricia Boss, MD 02/13/15 [phone]

## 2015-02-10 NOTE — Patient Instructions (Signed)
We will schedule CT right ankle and call you with results

## 2015-02-10 NOTE — ED Notes (Signed)
Dr. Jeanell Sparrow seeing pt in triage prior to RN assessment, see MD notes, pending orders. Pt sitting in w/c, alert, NAD, calm, interactive. Family at [facility].

## 2015-02-10 NOTE — Addendum Note (Signed)
Addended by: Baldomero Lamy B on: 02/10/2015 03:26 PM   Modules accepted: Orders

## 2015-02-10 NOTE — ED Notes (Signed)
Pt was seen for leg fx,  States splint is to tight, and still having pain,  States she thinks she is to have a ct but was not told where to go or when

## 2015-02-11 ENCOUNTER — Ambulatory Visit (INDEPENDENT_AMBULATORY_CARE_PROVIDER_SITE_OTHER): Payer: 59 | Admitting: Sports Medicine

## 2015-02-11 ENCOUNTER — Encounter: Payer: Self-pay | Admitting: Sports Medicine

## 2015-02-11 VITALS — BP 104/61 | HR 61 | Ht 70.0 in

## 2015-02-11 DIAGNOSIS — S82841A Displaced bimalleolar fracture of right lower leg, initial encounter for closed fracture: Secondary | ICD-10-CM | POA: Diagnosis not present

## 2015-02-11 MED ORDER — AMBULATORY NON FORMULARY MEDICATION: Status: DC

## 2015-02-11 MED ORDER — HYDROMORPHONE HCL 4 MG PO TABS: ORAL_TABLET | ORAL | Status: DC

## 2015-02-11 NOTE — Progress Notes (Signed)
   Subjective:    I'm seeing this patient as a consultation for:  Dr. Wolfgang Phoenix, Dr. Wilson Singer  CC: Foot fracture  HPI: This is a pleasant 54 year old female, 2 days ago she fell and had an injury to her ankle, x-ray showed a fracture, she was seen at [facility], CT was obtained, she was then seen in the emergency department for persistent pain. CT scan results will be dictated below. She was given some morphine, pain medication, and placed in a posterior slab splint in the emergency department. She was referred here for further evaluation and definitive treatment. Pain is severe, persistent.  Past medical history, Surgical history, Family history not pertinant except as noted below, Social history, Allergies, and medications have been entered into the medical record, reviewed, and no changes needed.   Review of Systems: No headache, visual changes, nausea, vomiting, diarrhea, constipation, dizziness, abdominal pain, skin rash, fevers, chills, night sweats, weight loss, swollen lymph nodes, body aches, joint swelling, muscle aches, chest pain, shortness of breath, mood changes, visual or auditory hallucinations.   Objective:   General: Well Developed, well nourished, and in no acute distress.  Neuro/Psych: Alert and oriented x3, extra-ocular muscles intact, able to move all 4 extremities, sensation grossly intact. Skin: Warm and dry, no rashes noted.  Respiratory: Not using accessory muscles, speaking in full sentences, trachea midline.  Cardiovascular: Pulses palpable, no extremity edema. Abdomen: Does not appear distended. Right foot: Entire leg is in a well shaped posterior slab splint. She is neurovascularly intact distally, the splint was not removed.  X-rays show a nondisplaced lateral malleolus oblique fracture into the mortise, subsequent CT scan shows bimalleolar fracture with posterior malleolus fragment into the joint but nondisplaced, and lateral malleolus fragment also into  the joint but below the tibiofibular ligament without any shift of the ankle mortise itself.  Impression and Recommendations:   This case required medical decision making of moderate complexity.

## 2015-02-11 NOTE — Telephone Encounter (Signed)
SPOKE WITH PATIENT AND SHE STATES THAT SHE WENT TO ER FOR PAIN LAST NIGHT, STATES THAT THEY DID CT SCAN SINCE YOU HAD ALREADY ORDERED AND STATED WHEN THEY REMOVED SPLINT IT HAD BROKEN AT HEEL BEND, PLEASE SEE HER CT SCAN AND ADVISE

## 2015-02-11 NOTE — Telephone Encounter (Signed)
Splints  Ok to loosen ace bandages if too tight   Call her this morning ask if too tight and if she says yes then tell her ok to loosen

## 2015-02-11 NOTE — Assessment & Plan Note (Addendum)
This is a bimalleolar fracture including the fibula as well as the posterior malleolus of the tibia. The fractures do into the joint but are nondisplaced. The tibiofibular joint distally also appears to be intact, and there is no widening of the ankle mortise. She is a well formed posterior slab splint, I'm going to add hydromorphone for pain relief, she will discontinue her prednisone currently being used for her sarcoidosis, and I'm going to give her a prescription for a rolling the walker. She will come back to see me early next week for repeat x-rays. She should be nonweightbearing. I did discuss this with 2 of my orthopedic surgery colleagues, considering stability and anatomic alignment of fractures, we can simply observe this before considering ORIF.  I billed a fracture code for this encounter, all subsequent visits will be post-op checks in the global period.

## 2015-02-14 NOTE — Telephone Encounter (Signed)
No surgery needed   Come in next week weds pm for poss cast

## 2015-02-14 NOTE — Telephone Encounter (Signed)
Scheduled patient per Dr Aline Brochure accordingly, for 02/23/15.

## 2015-02-14 NOTE — Telephone Encounter (Signed)
Patient aware, states will call back to schedule that appt

## 2015-02-14 NOTE — Telephone Encounter (Signed)
I reviewed over the ER notes after I came back from vacation plus also orthopedic notes. They are handling this issue for her.

## 2015-02-15 ENCOUNTER — Encounter (HOSPITAL_COMMUNITY): Payer: 59

## 2015-02-16 ENCOUNTER — Ambulatory Visit (INDEPENDENT_AMBULATORY_CARE_PROVIDER_SITE_OTHER): Payer: 59 | Admitting: Sports Medicine

## 2015-02-16 ENCOUNTER — Encounter: Payer: Self-pay | Admitting: Sports Medicine

## 2015-02-16 ENCOUNTER — Ambulatory Visit (INDEPENDENT_AMBULATORY_CARE_PROVIDER_SITE_OTHER): Payer: 59

## 2015-02-16 DIAGNOSIS — S82841D Displaced bimalleolar fracture of right lower leg, subsequent encounter for closed fracture with routine healing: Secondary | ICD-10-CM

## 2015-02-16 DIAGNOSIS — F329 Major depressive disorder, single episode, unspecified: Secondary | ICD-10-CM

## 2015-02-16 DIAGNOSIS — X58XXXD Exposure to other specified factors, subsequent encounter: Secondary | ICD-10-CM

## 2015-02-16 DIAGNOSIS — F32A Depression, unspecified: Secondary | ICD-10-CM

## 2015-02-16 DIAGNOSIS — S82841A Displaced bimalleolar fracture of right lower leg, initial encounter for closed fracture: Secondary | ICD-10-CM

## 2015-02-16 MED ORDER — DULOXETINE HCL 60 MG PO CPEP: 120.0000 mg | ORAL_CAPSULE | Freq: Every day | ORAL | Status: DC

## 2015-02-16 NOTE — Assessment & Plan Note (Signed)
Patient is unsure what dose of Cymbalta she is on. We are simply going to increase to the maximum dose. Cymbalta 120 daily.

## 2015-02-16 NOTE — Progress Notes (Signed)
  Subjective: 7 days post-bimalleolar right ankle fracture, doing well, still with some pain. She has developed a degree of depression. No suicidal or homicidal ideation.   Objective: General: Well-developed, well-nourished, and in no acute distress. Right ankle: Splint is removed, tender to palpation over the fibular fracture as well as over the posterior malleolus fracture, swollen and bruised but significantly better than before per patient report.  Short leg cast placed  Assessment/plan:

## 2015-02-16 NOTE — Assessment & Plan Note (Signed)
Doing well 7 days post fracture, x-ray show stable alignment of both fractures, short leg cast placed. Double hydromorphone, return to see me in 2 weeks, x-ray before visit. Considering her sarcoidosis and chronic steroid treatment, I did ask her to discontinue steroidal's and we are going to obtain a bone density test.

## 2015-02-17 ENCOUNTER — Telehealth: Payer: Self-pay | Admitting: *Deleted

## 2015-02-17 ENCOUNTER — Encounter (HOSPITAL_COMMUNITY): Payer: 59

## 2015-02-17 NOTE — ED Provider Notes (Signed)
CSN: [id number]     Arrival date & time 02/09/15  1525 History   First MD Initiated Contact with Patient 02/09/15 1552     Chief Complaint  Patient presents with  . Ankle Injury     (Consider location/radiation/quality/duration/timing/severity/associated sxs/prior Treatment) HPI  54yF with r ankle pain. Injury happened shortly before arrival. Stripped over rug. Twists ankle. Persistent pain and then swelling since. No numbness or tingling. Denies any other injuries.   Past Medical History  Diagnosis Date  . Sarcoidosis   . Breast fibrocystic disorder   . Mitral valve prolapse   . Breast pain     right  . Normal stress echocardiogram Jan 2011  . Normal nuclear stress test December 2012  . Normal echocardiogram December 2012    EF 55 to 60%; mild MVP, grade 1 diastolic dysfunction, PAP 32  . Fibromyalgia   . DOE (dyspnea on exertion)   . Pulmonary fibrosis   . Gastroesophageal reflux disease   . Heart disease   . Anemia    Past Surgical History  Procedure Laterality Date  . Tubal ligation    . Breast lumpectomy  01/12/2011    right   Family History  Problem Relation Age of Onset  . Cardiomyopathy Mother   . Breast cancer Mother   . Heart disease Maternal Aunt   . Prostate cancer Father   . Colon cancer Neg Hx   . Neurofibromatosis Brother   . Bipolar disorder Daughter    History  Substance Use Topics  . Smoking status: Never Smoker   . Smokeless tobacco: Never Used  . Alcohol Use: No   OB History    No data available     Review of Systems  All systems reviewed and negative, other than as noted in HPI.   Allergies  Bee venom; Benadryl; Codeine; Diphenhydramine; and Morphine and related  Home Medications   Prior to Admission medications   Medication Sig Start Date End Date Taking? Authorizing Provider  ALPRAZolam Duanne Moron) 1 MG tablet Take 0.5-1 tablets (0.5-1 mg total) by mouth 3 (three) times daily. Takes one half tablet twice daily and one tablet at  bedtime Patient taking differently: Take 0.5-1 mg by mouth 3 (three) times daily as needed for anxiety or sleep (Pt takes as needed during the day and every night for sleep.). Takes one half tablet twice daily and one tablet at bedtime 10/05/14  Yes Cloria Spring, MD  azaTHIOprine (IMURAN) 50 MG tablet Take 100 mg by mouth every morning.    Yes Historical Provider, MD  ferrous sulfate 325 (65 FE) MG EC tablet Take 325 mg by mouth daily.   Yes Historical Provider, MD  albuterol (PROVENTIL) (2.5 MG/3ML) 0.083% nebulizer solution Take 2.5 mg by nebulization every 6 (six) hours as needed for wheezing or shortness of breath.    Historical Provider, MD  AMBULATORY NON FORMULARY MEDICATION Rolling Knee Scooter, please take Rx to medical supply store. 02/11/15   Silverio Decamp, MD  DULoxetine (CYMBALTA) 60 MG capsule Take 2 capsules (120 mg total) by mouth daily. 02/16/15   Silverio Decamp, MD  EPINEPHrine 0.3 mg/0.3 mL IJ SOAJ injection Inject 0.3 mLs (0.3 mg total) into the muscle once. 02/18/14   [name], MD  HYDROmorphone (DILAUDID) 4 MG tablet 1-2 tabs PO q8h prn pain. 02/11/15   Silverio Decamp, MD  Pantoprazole Sodium (PROTONIX PO) Take by mouth daily.    Historical Provider, MD   BP 101/55 mmHg  Pulse 56  Temp(Src) 97.9 F (36.6 C) (Oral)  Resp 18  Ht 5\' 10"  (1.778 m)  Wt 200 lb (90.719 kg)  BMI 28.70 kg/m2  SpO2 93%  LMP 02/02/2015 Physical Exam  Constitutional: She appears well-developed and well-nourished. No distress.  HENT:  Head: Normocephalic and atraumatic.  Eyes: Conjunctivae are normal. Right eye exhibits no discharge. Left eye exhibits no discharge.  Neck: Neck supple.  Cardiovascular: Normal rate, regular rhythm and normal heart sounds.  Exam reveals no gallop and no friction rub.   No murmur heard. Pulmonary/Chest: Effort normal and breath sounds normal. No respiratory distress.  Abdominal: Soft. She exhibits no distension. There is no tenderness.   Musculoskeletal:  Swelling, ecchymosis to R foot/ankle. TTP over lateral mal. NVI. Closed injury.   Neurological: She is alert.  Skin: Skin is warm and dry.  Psychiatric: She has a normal mood and affect. Her behavior is normal. Thought content normal.  Nursing note and vitals reviewed.   ED Course  Procedures (including critical care time) Labs Review Labs Reviewed - No data to display  Imaging Review Dg Ankle Complete Right  02/16/2015   CLINICAL DATA:  Right ankle fracture, followup  EXAM: RIGHT ANKLE - COMPLETE 3+ VIEW  COMPARISON:  Right ankle films of 02/09/2015  FINDINGS: The fracture through the distal right fibula is only vaguely visualized, with no displacement. There may be a small amount of callus present although overlapping material makes assessment difficult. The ankle joint appears normal.  IMPRESSION: The nondisplaced fracture of the distal right fibula is less well seen but no change in alignment is noted. The ankle joint is unremarkable.   Electronically Signed   By: Ivar Drape M.D.   On: 02/16/2015 08:28     EKG Interpretation None      MDM   Final diagnoses:  Fibula fracture, right, closed, initial encounter    54yF with ankle pain. Closed distal fibula fx. NVI. Splint. PRN pain meds. Ortho FU.     Virgel Manifold, MD 02/17/15 1121

## 2015-02-17 NOTE — Telephone Encounter (Signed)
PHARMACIST FROM [facility] CALLED TO ADVISE PATIENT BROUGHT IN OUR HYDROCODONE SCRIPT WITH A PERCOCET SCRIPT FROM DR Wolfgang Phoenix, STATES HE FILLED PERCOCET AND HELD OUR SCRIPT, ADVISED MULTIPLE SCRIPTS FROM MULTIPLE MD'S

## 2015-02-18 ENCOUNTER — Telehealth: Payer: Self-pay | Admitting: *Deleted

## 2015-02-18 NOTE — Telephone Encounter (Signed)
Discussed with patient. Patient verbalized understanding and stated she is mostly taking the Percocet at this time for her pain-has taken very few Dilaudid. Patient states she is spacing meds and not taking together.

## 2015-02-18 NOTE — Telephone Encounter (Signed)
I instructed the pharmacy that they should not fill her hydrocodone at the same time his Percocet I'm afraid this patient would take too much medication she is recently had a fracture and she has necrosis of the hip I'm sure she has pain but her Percocet should help her if she should need more medication she should get this through just 1 provider not through several providers.

## 2015-02-18 NOTE — Telephone Encounter (Signed)
[facility] called and spoke to Dr Nicki Reaper concerning patient trying to fill pain meds from multiple providers. Hydromorphone 4mg   #30 prescribed by ortho in [place] was filled 02/11/15 and patient presented to pharmacy yest with a script of percocet 5/325 #40 from Dr Nicki Reaper and Hydrocodone 10/325 #42 from Dr Aline Brochure. Pharmacy filled the Percocet prescription but not the hydrocodone prescription and notified Dr Aline Brochure and Dr Nicki Reaper.

## 2015-02-21 ENCOUNTER — Telehealth: Payer: Self-pay | Admitting: Family Medicine

## 2015-02-21 MED ORDER — CEFPROZIL 500 MG PO TABS
500.0000 mg | ORAL_TABLET | Freq: Two times a day (BID) | ORAL | Status: DC
Start: 2015-02-21 — End: 2015-03-17

## 2015-02-21 NOTE — Telephone Encounter (Signed)
Pt is having burning/pain with urination  Can you call in some antibiotics to [facility]   Call pt when this has been sent

## 2015-02-21 NOTE — Telephone Encounter (Signed)
Rx sent electronically to pharmacy. Patient notified and advised to go to ER if fevers or worse.

## 2015-02-22 ENCOUNTER — Encounter (HOSPITAL_COMMUNITY): Payer: 59

## 2015-02-23 ENCOUNTER — Ambulatory Visit: Payer: Self-pay | Admitting: Orthopedic Surgery

## 2015-02-24 ENCOUNTER — Encounter (HOSPITAL_COMMUNITY): Payer: 59

## 2015-02-25 NOTE — Addendum Note (Signed)
Encounter addended by: Cathie Olden, RN on: 02/25/2015  3:04 PM<BR>     Documentation filed: Notes Section

## 2015-02-25 NOTE — Progress Notes (Signed)
Patient is discharged from [place] [facility] Pul[facility] program today with 5 sessions.  Patient stopped due to health.  Patients last session was 01/18/15.

## 2015-03-01 ENCOUNTER — Encounter (HOSPITAL_COMMUNITY): Payer: 59

## 2015-03-02 ENCOUNTER — Ambulatory Visit (INDEPENDENT_AMBULATORY_CARE_PROVIDER_SITE_OTHER): Payer: 59

## 2015-03-02 ENCOUNTER — Ambulatory Visit (INDEPENDENT_AMBULATORY_CARE_PROVIDER_SITE_OTHER): Payer: 59 | Admitting: Sports Medicine

## 2015-03-02 ENCOUNTER — Encounter: Payer: Self-pay | Admitting: Sports Medicine

## 2015-03-02 VITALS — BP 118/63 | HR 88

## 2015-03-02 DIAGNOSIS — S82841D Displaced bimalleolar fracture of right lower leg, subsequent encounter for closed fracture with routine healing: Secondary | ICD-10-CM

## 2015-03-02 DIAGNOSIS — X58XXXD Exposure to other specified factors, subsequent encounter: Secondary | ICD-10-CM | POA: Diagnosis not present

## 2015-03-02 DIAGNOSIS — S82841A Displaced bimalleolar fracture of right lower leg, initial encounter for closed fracture: Secondary | ICD-10-CM | POA: Diagnosis not present

## 2015-03-02 NOTE — Progress Notes (Signed)
  Subjective: 3 weeks post bimalleolar fracture, lateral and posterior, doing well and short leg cast.   Objective: General: Well-developed, well-nourished, and in no acute distress. Right Ankle: Cast is removed, minimal tenderness and swelling as well as bruising as expected but neurovascularly intact, overall significantly improved.  New short leg walking cast placed.  X-rays reviewed, with bony callus and no change in alignment.  Assessment/plan:

## 2015-03-02 NOTE — Progress Notes (Signed)
Patient unable to weigh due to being on a scooter. Kathleen [name],CMA

## 2015-03-02 NOTE — Assessment & Plan Note (Signed)
Doing extremely well. 2 more weeks in a walking cast. Return in 2 weeks, we will remove the cast at that time and probably transition to a boot.  She does want beach in 2.5 weeks.

## 2015-03-03 ENCOUNTER — Encounter (HOSPITAL_COMMUNITY): Payer: 59

## 2015-03-07 ENCOUNTER — Ambulatory Visit: Payer: 59 | Admitting: Family Medicine

## 2015-03-08 ENCOUNTER — Encounter (HOSPITAL_COMMUNITY): Payer: 59

## 2015-03-10 ENCOUNTER — Encounter (HOSPITAL_COMMUNITY): Payer: 59

## 2015-03-15 ENCOUNTER — Ambulatory Visit (INDEPENDENT_AMBULATORY_CARE_PROVIDER_SITE_OTHER): Payer: 59 | Admitting: Sports Medicine

## 2015-03-15 ENCOUNTER — Ambulatory Visit (INDEPENDENT_AMBULATORY_CARE_PROVIDER_SITE_OTHER): Payer: 59

## 2015-03-15 ENCOUNTER — Telehealth: Payer: Self-pay

## 2015-03-15 ENCOUNTER — Encounter (HOSPITAL_COMMUNITY): Payer: 59

## 2015-03-15 DIAGNOSIS — S82841D Displaced bimalleolar fracture of right lower leg, subsequent encounter for closed fracture with routine healing: Secondary | ICD-10-CM | POA: Diagnosis not present

## 2015-03-15 DIAGNOSIS — X58XXXD Exposure to other specified factors, subsequent encounter: Secondary | ICD-10-CM

## 2015-03-15 NOTE — Telephone Encounter (Signed)
Just wanted to inform you all that patient Kathleen [name]  [id number]#[id number] was seen in office today 03/15/15  follow up for right ankle fracture. Dr. Darene Lamer asked me to give the patient ankle stirrup brace.  We only had 1 that was a left and Urgent Care didn't have any. I showed Dr. Darene Lamer and he said that it was ok it would fit. I completed the paperwork , advised patient that it could go on either ankle. I put it on the patient asked the patient how did it fit she said it felt comfortable, patient put on post op shoe and walked out of the clinic with the brace on. A couple of hours later patient called the office screaming at [facility] about the brace not fitting and she demanded that someone bring her the correct brace to her house.  I called Thurmond Butts [phone]  from [facility] supply and asked him if he could bring in a supply of the brace. He stated that we had the universal kits but the only difference between what the patient got and the universal is that more stuff come in the one with the kit but the white ones were the older ones and it didn't matter if left was placed on right or if right is placed on left. I called the patient and advised her again of this and she stated that [facility] will be picking one up for her 03/16/2015. The universal care kit was placed in the triage office for patient. Rhonda Cunningham,CMA

## 2015-03-15 NOTE — Assessment & Plan Note (Signed)
Doing extremely well, transition to a stirrup Aircast. X-rays, return in 3 weeks. I suspect she can restart her daily prednisone in about a week or 2.

## 2015-03-15 NOTE — Progress Notes (Signed)
  Subjective: 5.5 weeks post right bimalleolar fracture, doing well, cast immobilization for most of this.   Objective: General: Well-developed, well-nourished, and in no acute distress. Right ankle: Cast is removed, no longer tender over the fracture, able to and when appropriate we, still has a bit of swelling over the midfoot.  Ankle was strapped with compressive dressing  Assessment/plan:

## 2015-03-17 ENCOUNTER — Encounter (HOSPITAL_COMMUNITY): Payer: 59

## 2015-03-17 ENCOUNTER — Ambulatory Visit (INDEPENDENT_AMBULATORY_CARE_PROVIDER_SITE_OTHER): Payer: 59 | Admitting: Family Medicine

## 2015-03-17 ENCOUNTER — Encounter: Payer: Self-pay | Admitting: Family Medicine

## 2015-03-17 VITALS — BP 102/68 | Ht 70.0 in | Wt 213.0 lb

## 2015-03-17 DIAGNOSIS — D649 Anemia, unspecified: Secondary | ICD-10-CM

## 2015-03-17 DIAGNOSIS — S82841D Displaced bimalleolar fracture of right lower leg, subsequent encounter for closed fracture with routine healing: Secondary | ICD-10-CM | POA: Diagnosis not present

## 2015-03-17 DIAGNOSIS — M87052 Idiopathic aseptic necrosis of left femur: Secondary | ICD-10-CM | POA: Diagnosis not present

## 2015-03-17 DIAGNOSIS — E785 Hyperlipidemia, unspecified: Secondary | ICD-10-CM | POA: Diagnosis not present

## 2015-03-17 DIAGNOSIS — Z139 Encounter for screening, unspecified: Secondary | ICD-10-CM | POA: Diagnosis not present

## 2015-03-17 DIAGNOSIS — M8580 Other specified disorders of bone density and structure, unspecified site: Secondary | ICD-10-CM | POA: Diagnosis not present

## 2015-03-17 MED ORDER — OXYCODONE-ACETAMINOPHEN 5-325 MG PO TABS: 1.0000 | ORAL_TABLET | Freq: Two times a day (BID) | ORAL | Status: DC | PRN

## 2015-03-17 NOTE — Progress Notes (Signed)
   Subjective:    Patient ID: Kathleen [name], female    DOB: [birth year], 54 y.o.   MRN: [id number]  HPIpt arrives today for a follow up on hip pain. Has seen ortho for her hip.   Pt is taking oxycodone for the pain. Pt requesting refill on pain med. Pt states she got #40 last time but she is using about 2 per day. She denies misusing pain medicine she states she is going to throw the other pain medicine away  Right foot pain. She is seeing a specialist for her foot.   Pt is following up with lung specialist. The specialist told her she needs to stay on Imuran they state that she cannot come off this  She saw orthopedics specialist who told her that the surgery could not be done because she is on Imuran this is let the patient between a rock and a hard place    Review of Systems Breathing is stable but still impaired denies chest pain relates severe hip pain and ankle pain patient relates being anxious and depressed following up with specialist for this    Objective:   Physical Exam  Her lungs are clear hearts regular pulse normal there is a brace on her right ankle in subjective pain in her left hip where she has avascular necrosis      Assessment & Plan:  1. Anemia, unspecified anemia type Anemia under decent control but needs to be monitored - CBC with Differential/Platelet - Ferritin - Iron Binding Cap (TIBC)  2. Hyperlipemia History hyperlipidemia check lipid profile - Lipid panel  3. Screening Screening because of high risk medication she is still tapering down prednisone - Glucose, random  4. Avascular necrosis of bone of hip, left Patient would like to have surgery by orthopedic surgeon told her that she cannot have surgery we will try to see what can be done to get the 2 specialty groups together  5. Bimalleolar fracture of right ankle, closed, with routine healing, subsequent encounter This is gradually healing she has a brace on  6. Osteopenia She will need a  bone density the fall of 2017    I believe it would be a good idea for her pulmonologist to talk with her regarding whether or not she can come off Imuran in order to have hip surgery. Currently even the orthopedic specialist at [facility] recommends that she not have hip surgery while on Imuran. Obviously the patient is having significant pain in her hip it would be nice if all of the specialty groups might be able to work out some sort of solution. A copy of this dictation as well as a cover letter will be sent to her pulmonologist Dr. Farley Ly at [facility]

## 2015-03-18 LAB — CBC WITH DIFFERENTIAL/PLATELET
Basophils Absolute: 0 10*3/uL (ref 0.0–0.2)
Basos: 0 %
EOS (ABSOLUTE): 0.1 10*3/uL (ref 0.0–0.4)
Eos: 3 %
Hematocrit: 35.9 % (ref 34.0–46.6)
Hemoglobin: 11.9 g/dL (ref 11.1–15.9)
Immature Grans (Abs): 0 10*3/uL (ref 0.0–0.1)
Immature Granulocytes: 0 %
Lymphocytes Absolute: 0.6 10*3/uL — ABNORMAL LOW (ref 0.7–3.1)
Lymphs: 12 %
MCH: 28.6 pg (ref 26.6–33.0)
MCHC: 33.1 g/dL (ref 31.5–35.7)
MCV: 86 fL (ref 79–97)
Monocytes Absolute: 0.5 10*3/uL (ref 0.1–0.9)
Monocytes: 11 %
Neutrophils Absolute: 3.2 10*3/uL (ref 1.4–7.0)
Neutrophils: 74 %
Platelets: 346 10*3/uL (ref 150–379)
RBC: 4.16 x10E6/uL (ref 3.77–5.28)
RDW: 16.6 % — ABNORMAL HIGH (ref 12.3–15.4)
WBC: 4.5 10*3/uL (ref 3.4–10.8)

## 2015-03-18 LAB — FERRITIN: Ferritin: 28 ng/mL (ref 15–150)

## 2015-03-18 LAB — LIPID PANEL
Chol/HDL Ratio: 4.1 ratio units (ref 0.0–4.4)
Cholesterol, Total: 203 mg/dL — ABNORMAL HIGH (ref 100–199)
HDL: 49 mg/dL (ref >39)
LDL Calculated: 128 mg/dL — ABNORMAL HIGH (ref 0–99)
Triglycerides: 130 mg/dL (ref 0–149)
VLDL Cholesterol Cal: 26 mg/dL (ref 5–40)

## 2015-03-18 LAB — IRON AND TIBC
Iron Saturation: 14 % — ABNORMAL LOW (ref 15–55)
Iron: 49 ug/dL (ref 27–159)
Total Iron Binding Capacity: 348 ug/dL (ref 250–450)
UIBC: 299 ug/dL (ref 131–425)

## 2015-03-18 LAB — GLUCOSE, RANDOM: Glucose: 91 mg/dL (ref 65–99)

## 2015-03-22 ENCOUNTER — Encounter (HOSPITAL_COMMUNITY): Payer: 59

## 2015-03-24 ENCOUNTER — Encounter (HOSPITAL_COMMUNITY): Payer: 59

## 2015-04-05 ENCOUNTER — Ambulatory Visit (INDEPENDENT_AMBULATORY_CARE_PROVIDER_SITE_OTHER): Payer: 59 | Admitting: Sports Medicine

## 2015-04-05 ENCOUNTER — Encounter: Payer: Self-pay | Admitting: Sports Medicine

## 2015-04-05 ENCOUNTER — Telehealth: Payer: Self-pay | Admitting: Family Medicine

## 2015-04-05 VITALS — BP 114/58 | HR 100 | Ht 70.0 in | Wt 200.0 lb

## 2015-04-05 DIAGNOSIS — S82841G Displaced bimalleolar fracture of right lower leg, subsequent encounter for closed fracture with delayed healing: Secondary | ICD-10-CM

## 2015-04-05 DIAGNOSIS — M87052 Idiopathic aseptic necrosis of left femur: Secondary | ICD-10-CM | POA: Diagnosis not present

## 2015-04-05 MED ORDER — ALENDRONATE SODIUM 70 MG PO TABS: 70.0000 mg | ORAL_TABLET | ORAL | Status: DC

## 2015-04-05 NOTE — Telephone Encounter (Signed)
pts calling to let you know the med that the orthopedic put her on is   Fosamax 1 time a week   She wasn't sure if she needed to make an appt, she is just picking it up Today. Has not tried it yet to see how it works for her hip pain.   Please advise

## 2015-04-05 NOTE — Assessment & Plan Note (Signed)
Doing extremely well now 8.5 weeks post fracture, still has some swelling, pain, and significant weakness. She can transition to a regular shoe with the Aircast, and we are going to start formal physical therapy. Ankle was strapped with compressive dressing Patient is on daily prednisone for sarcoidosis, and healing will probably be delayed Return to see me in 6 weeks.

## 2015-04-05 NOTE — Telephone Encounter (Signed)
Notified patient purpose of Fosamax is to help strengthen the bone. Dr. Nicki Reaper would imagine the orthopedist is trying to help the bone stay as strong as possible for any upcoming surgery. It is always important to make sure she is taking it properly on an empty stomach in the morning 30 minutes before meal. Take with a tall glass of water. On empty stomach. Keep upright after taking it. If it starts causing severe heartburn she may need to come off of it. Patient verbalized understanding.

## 2015-04-05 NOTE — Progress Notes (Signed)
  Subjective:    CC: Follow-up  HPI: Bimalleolar fracture of the right ankle: 8 weeks out, doing well in simply an Aircast, still has some weakness and difficulty walking. Amenable to start formal physical therapy, overall pain-free.  Sarcoidosis: I think it's okay for her to now restart her daily prednisone  Avascular necrosis of the left femoral head: With osteoarthritis of the right side, recent x-ray [facility] showed minimal collapse with sclerosis of the femoral head, she is amenable to starting medication to decrease the chance of femoral head collapse.  Past medical history, Surgical history, Family history not pertinant except as noted below, Social history, Allergies, and medications have been entered into the medical record, reviewed, and no changes needed.   Review of Systems: No fevers, chills, night sweats, weight loss, chest pain, or shortness of breath.   Objective:    General: Well Developed, well nourished, and in no acute distress.  Neuro: Alert and oriented x3, extra-ocular muscles intact, sensation grossly intact.  HEENT: Normocephalic, atraumatic, pupils equal round reactive to light, neck supple, no masses, no lymphadenopathy, thyroid nonpalpable.  Skin: Warm and dry, no rashes. Cardiac: Regular rate and rhythm, no murmurs rubs or gallops, no lower extremity edema.  Respiratory: Clear to auscultation bilaterally. Not using accessory muscles, speaking in full sentences. Right ankle: Minimally swollen, no longer tender to palpation, weak in all directions. Ligament is structures are stable.  Ankle was strapped with compressive dressing  Impression and Recommendations:    I spent 25 minutes with this patient, greater than 50% was face-to-face time counseling regarding the above diagnoses

## 2015-04-05 NOTE — Telephone Encounter (Signed)
The purpose of Fosamax is to help strengthen the bone. I would imagine the orthopedist is trying to help the bone stay as strong as possible for any upcoming surgery. It is always important to make sure she is taking it properly on an empty stomach in the morning 30 minutes before meal. Take with a tall glass of water. On empty stomach. Keep upright after taking it. If it starts causing severe heartburn she may need to come off of it

## 2015-04-05 NOTE — Assessment & Plan Note (Signed)
On the most recent x-ray at [facility] there was evidence of sclerosis of the femoral head with only minimal collapse, for this reason I am going to start Fosamax which has been shown to delay collapse of the femoral head with avascular necrosis. This will eventually progressed due to chronic prednisone therapy.

## 2015-04-05 NOTE — Telephone Encounter (Signed)
[facility]

## 2015-04-15 ENCOUNTER — Other Ambulatory Visit: Payer: Self-pay | Admitting: Family Medicine

## 2015-04-15 MED ORDER — OXYCODONE-ACETAMINOPHEN 5-325 MG PO TABS: 1.0000 | ORAL_TABLET | Freq: Two times a day (BID) | ORAL | Status: DC | PRN

## 2015-04-15 NOTE — Telephone Encounter (Signed)
oxyCODONE-acetaminophen (PERCOCET/ROXICET) 5-325 MG per tablet  Pt needs refill on this med please, headed to the beach in the morning an needs to  Get it filled today if possible

## 2015-04-15 NOTE — Telephone Encounter (Signed)
Rx up front for patient pick up. Patient notified. 

## 2015-04-15 NOTE — Telephone Encounter (Signed)
May have refill of her medicine please notify her to pick up prescription

## 2015-04-26 ENCOUNTER — Ambulatory Visit: Payer: 59 | Admitting: Family Medicine

## 2015-04-27 ENCOUNTER — Ambulatory Visit: Payer: 59 | Admitting: Family Medicine

## 2015-05-18 ENCOUNTER — Ambulatory Visit (INDEPENDENT_AMBULATORY_CARE_PROVIDER_SITE_OTHER): Payer: 59 | Admitting: Sports Medicine

## 2015-05-18 ENCOUNTER — Encounter: Payer: Self-pay | Admitting: Sports Medicine

## 2015-05-18 VITALS — BP 121/56 | HR 82 | Ht 68.0 in | Wt 200.0 lb

## 2015-05-18 DIAGNOSIS — M87052 Idiopathic aseptic necrosis of left femur: Secondary | ICD-10-CM | POA: Diagnosis not present

## 2015-05-18 DIAGNOSIS — S82841G Displaced bimalleolar fracture of right lower leg, subsequent encounter for closed fracture with delayed healing: Secondary | ICD-10-CM | POA: Diagnosis not present

## 2015-05-18 NOTE — Assessment & Plan Note (Signed)
Continue Fosamax which has been shown to delay collapse of the femoral head in avascular necrosis. Unfortunately we are not facing the quandary of treating sarcoidosis and considering hip arthroplasty. She is unable to come off of the Imuran due to potential flare of her sarcoidosis, and her previous 3 orthopedic surgeons have recommended not proceeding with hip replacement while on immunosuppressives. I wonder if core drilling/decompression would be a possibility, and I would like her to touch base with Dr. Jaynee Eagles up the road to discuss this.

## 2015-05-18 NOTE — Progress Notes (Signed)
  Subjective:    CC:  Follow-up  HPI: Overall 3 months post-bimalleolar fracture, doing well with the exception of some stiffness and swelling, She has not yet proceeded with physical therapy, and continues to wear unsupportive slippers.  Left hip avascular necrosis: Currently on Fosamax now to delay femoral head collapse, she is also on Imuran for sarcoidosis and unfortunately none of the previous surgeons will operate on her if she continues Imuran. It seems total hip arthroplasty has been the only option discussed, and she  Has not yet discussed core decompression.  Past medical history, Surgical history, Family history not pertinant except as noted below, Social history, Allergies, and medications have been entered into the medical record, reviewed, and no changes needed.   Review of Systems: No fevers, chills, night sweats, weight loss, chest pain, or shortness of breath.   Objective:    General: Well Developed, well nourished, and in no acute distress.  Neuro: Alert and oriented x3, extra-ocular muscles intact, sensation grossly intact.  HEENT: Normocephalic, atraumatic, pupils equal round reactive to light, neck supple, no masses, no lymphadenopathy, thyroid nonpalpable.  Skin: Warm and dry, no rashes. Cardiac: Regular rate and rhythm, no murmurs rubs or gallops, no lower extremity edema.  Respiratory: Clear to auscultation bilaterally. Not using accessory muscles, speaking in full sentences. Right Ankle: Visibly swollen over the dorsum of the midfoot with minimal tenderness, no tenderness over the fractures. Range of motion is minimally decreased actively, passively I can attain near full range of motion. Strength is 5/5 in all directions. Stable lateral and medial ligaments; squeeze test and kleiger test unremarkable; Talar dome nontender; No pain at base of 5th MT; No tenderness over cuboid; No tenderness over N spot or navicular prominence No tenderness on posterior aspects of  lateral and medial malleolus No sign of peroneal tendon subluxations; Negative tarsal tunnel tinel's ambulates with rolling walker.  Impression and Recommendations:

## 2015-05-18 NOTE — Assessment & Plan Note (Signed)
Continues to improve but unfortunately has not yet pursued  Physical therapy, and continues to wear unsupportive slippers.  I have recommended that she proceed with formal physical therapy to regain her strength and range of motion after the period of immobilization, and to only wear supportive athletic shoes.

## 2015-05-19 ENCOUNTER — Ambulatory Visit (INDEPENDENT_AMBULATORY_CARE_PROVIDER_SITE_OTHER): Payer: 59 | Admitting: Family Medicine

## 2015-05-19 ENCOUNTER — Encounter: Payer: Self-pay | Admitting: Family Medicine

## 2015-05-19 VITALS — BP 124/70 | Ht 68.0 in | Wt 200.0 lb

## 2015-05-19 DIAGNOSIS — D649 Anemia, unspecified: Secondary | ICD-10-CM | POA: Diagnosis not present

## 2015-05-19 DIAGNOSIS — E785 Hyperlipidemia, unspecified: Secondary | ICD-10-CM | POA: Diagnosis not present

## 2015-05-19 DIAGNOSIS — M87052 Idiopathic aseptic necrosis of left femur: Secondary | ICD-10-CM | POA: Diagnosis not present

## 2015-05-19 DIAGNOSIS — G894 Chronic pain syndrome: Secondary | ICD-10-CM

## 2015-05-19 DIAGNOSIS — D869 Sarcoidosis, unspecified: Secondary | ICD-10-CM

## 2015-05-19 MED ORDER — OXYCODONE-ACETAMINOPHEN 5-325 MG PO TABS: 1.0000 | ORAL_TABLET | Freq: Two times a day (BID) | ORAL | Status: DC | PRN

## 2015-05-19 NOTE — Progress Notes (Signed)
   Subjective:    Patient ID: Kathleen [name], female    DOB: 1961/07/06, 54 y.o.   MRN: [id number]  Hyperlipidemia This is a chronic problem. The current episode started more than 1 year ago. There are no known factors aggravating her hyperlipidemia. Associated symptoms include shortness of breath. Pertinent negatives include no chest pain. Current antihyperlipidemic treatment includes diet change. The current treatment provides moderate improvement of lipids. There are no compliance problems.  There are no known risk factors for coronary artery disease.   Patient states that her breathing has not been very good lately. She has an appointment with her pulmonary doctor on Monday.  Patient receives her flu shot when she goes to her specialist in [place].  This patient has chronic pain in her left hip from avascular necrosis she also has chronic pain in the right ankle for fracture she uses pain medication intermittently. She denies abusing the medication. She has had some problems with being anemic in the past although recently has been doing better she is been encouraged to continue medication although she was encouraged to bump up her iron tablets twice daily She does have hyperlipidemia and she tries keep this under control. Diet alone. I spent time with the patient today reviewing over her lipids CBC. 10 TIBC. Review of Systems  Constitutional: Negative for activity change, appetite change and fatigue.  HENT: Negative for congestion.   Respiratory: Positive for shortness of breath. Negative for cough.   Cardiovascular: Negative for chest pain.  Gastrointestinal: Negative for abdominal pain.  Endocrine: Negative for polydipsia and polyphagia.  Neurological: Negative for weakness.  Psychiatric/Behavioral: Negative for confusion.       Objective:   Physical Exam  Constitutional: She appears well-nourished. No distress.  Cardiovascular: Normal rate, regular rhythm and normal heart sounds.    No murmur heard. Pulmonary/Chest: Effort normal and breath sounds normal. No respiratory distress.  Musculoskeletal: She exhibits no edema.  Lymphadenopathy:    She has no cervical adenopathy.  Neurological: She is alert. She exhibits normal muscle tone.  Psychiatric: Her behavior is normal.  Vitals reviewed.   Greater than 25 minutes spent with patient discussing her sarcoidosis the dilemma she is having regarding her left hip inhaled no surgeon wants to do surgery on her because she is on Imuran plus also her feelings of being overwhelmed with the disease process and in addition to this managing multiple other health problems area      Assessment & Plan:  1. Avascular necrosis of bone of left hip So the patient was encouraged to talk with her pulmonologist regarding whether or not they could stop the Imuran for shortness at a time for her to have surgery on her hip currently no surgeon will do surgery on her hip because she is on Imuran  2. Hyperlipemia Her cholesterol is reasonable continue watching diet staying as active as she can tolerate  3. Anemia, unspecified anemia type She has significant anemia in the past but hemoglobin looks good now but iron storage low she is to take twice daily. She is up-to-date on colonoscopy  4. Chronic pain syndrome She has chronic pain pain medications were refilled.  5. Sarcoidosis Sarcoidosis stable. Follow through pulmonology. A copy of this note will be sent to her pulmonologist

## 2015-05-19 NOTE — Patient Instructions (Signed)
Take one Iron tablet twice a day  Use Fibercon powder, 2 tsp twice a day in water

## 2015-06-01 ENCOUNTER — Ambulatory Visit (HOSPITAL_COMMUNITY): Payer: 59 | Attending: Internal Medicine | Admitting: Physical Therapy

## 2015-06-01 DIAGNOSIS — R2681 Unsteadiness on feet: Secondary | ICD-10-CM | POA: Insufficient documentation

## 2015-06-01 DIAGNOSIS — M25552 Pain in left hip: Secondary | ICD-10-CM | POA: Insufficient documentation

## 2015-06-01 DIAGNOSIS — M87052 Idiopathic aseptic necrosis of left femur: Secondary | ICD-10-CM | POA: Insufficient documentation

## 2015-06-01 DIAGNOSIS — M1612 Unilateral primary osteoarthritis, left hip: Secondary | ICD-10-CM | POA: Insufficient documentation

## 2015-06-01 DIAGNOSIS — M6281 Muscle weakness (generalized): Secondary | ICD-10-CM | POA: Diagnosis present

## 2015-06-01 DIAGNOSIS — Z9181 History of falling: Secondary | ICD-10-CM | POA: Diagnosis present

## 2015-06-01 DIAGNOSIS — R262 Difficulty in walking, not elsewhere classified: Secondary | ICD-10-CM | POA: Insufficient documentation

## 2015-06-01 NOTE — Patient Instructions (Signed)
   ANKLE PUMPS - AP  Bend your foot up and down at your ankle joint as shown. Repeat 10 times, 1 time per day.    LONG ARC QUAD - LAQ - HIGH SEAT  While seated with your knee in a bent position, slowly straighten your knee as you raise your foot upwards as shown.   Repeat 10 times each leg, 1x/day.   SUPINE HIP ABDUCTION  While lying on your back, slowly bring your leg out to the side. Keep  your knee straight the entire time. Your husband may need you help you with this.  Repeat 5 times, 1-2 times per day.    Supine Marching  Lay on back with knees bent. Keeping you stomach muscles tight, lift one leg a few inches off the floor, place back down, and repeat with the other leg. One on each side is one repetition. You may need your husband to help you with this exercise.  Repeat 5 times, 1-2 times per day.

## 2015-06-01 NOTE — Therapy (Addendum)
[facility] [place], [place], [address] Phone: [phone]   Fax:  [phone]  Physical Therapy Evaluation  Patient Details  Name: Kathleen [name] MRN: [id number] Date of Birth: [birth year] Referring Provider:  Patrina Levering, MD  Encounter Date: 06/01/2015      PT End of Session - 06/01/15 1203    Visit Number 1   Number of Visits 12   Date for PT Re-Evaluation 06/29/15   Authorization Type MCR and UHC UMR    Authorization Time Period 06/01/15 to 08/01/15   Authorization - Visit Number 1   Authorization - Number of Visits 10   PT Start Time 1110   PT Stop Time 1155   PT Time Calculation (min) 45 min   Activity Tolerance Patient limited by pain   Behavior During Therapy [facility] for tasks assessed/performed      Past Medical History  Diagnosis Date  . Sarcoidosis   . Breast fibrocystic disorder   . Mitral valve prolapse   . Breast pain     right  . Normal stress echocardiogram Jan 2011  . Normal nuclear stress test December 2012  . Normal echocardiogram December 2012    EF 55 to 60%; mild MVP, grade 1 diastolic dysfunction, PAP 32  . Fibromyalgia   . DOE (dyspnea on exertion)   . Pulmonary fibrosis   . Gastroesophageal reflux disease   . Heart disease   . Anemia     Past Surgical History  Procedure Laterality Date  . Tubal ligation    . Breast lumpectomy  01/12/2011    right    There were no vitals filed for this visit.  Visit Diagnosis:  Avascular necrosis of bone of hip, left ([place]) - Plan: PT plan of care cert/re-cert  Osteoarthritis of left hip, unspecified osteoarthritis type - Plan: PT plan of care cert/re-cert  Muscle weakness - Plan: PT plan of care cert/re-cert  Left hip pain - Plan: PT plan of care cert/re-cert  Difficulty walking - Plan: PT plan of care cert/re-cert  At risk for falling - Plan: PT plan of care cert/re-cert  Unsteadiness - Plan: PT plan of care cert/re-cert       Subjective Assessment - 06/01/15 1115    Subjective Very painful on a daily basis, cannot find comfortable position. Constant pain.    Pertinent History Patient was first diagnosed with AVN of L hip around 6-8 months ago; she did not know what was going on, just thought she had a sore hip. Cannot have a hip replacement due to medicine she is on for her lungs, she is not able to handle the anesthetic. Cannot stand pain in sitting or laying- really cannot find a comfortable position. She has tried to do things at home on her own but cannot tell a difference in it yet.    How long can you sit comfortably? Immediate discomfort    How long can you stand comfortably? Immediate discomfort    How long can you walk comfortably? Immediate discomfort    Patient Stated Goals try to get better    Currently in Pain? Yes   Pain Score 9    Pain Location Hip   Pain Orientation Left            [facility] PT Assessment - 06/01/15 0001    Assessment   Medical Diagnosis L hip AVN and DJD    Onset Date/Surgical Date 12/30/14  approx    Next MD  Visit Lung MD in December; should be going back to hip doctor after PT    Precautions   Precautions Other (comment)   Precaution Comments sarcoidosis    Restrictions   Weight Bearing Restrictions No   Balance Screen   Has the patient fallen in the past 6 months No   Has the patient had a decrease in activity level because of a fear of falling?  Yes   Is the patient reluctant to leave their home because of a fear of falling?  Yes   Prior Function   Level of Independence Independent;Independent with basic ADLs;Independent with gait;Independent with transfers   Vocation On disability   Vocation Requirements no hobbies    Posture/Postural Control   Posture Comments flexed at hips, reduced weight bearing L LE, forward head with B IR shoulders, reduced spinal curves    Strength   Right Hip Flexion 3-/5   Right Hip Extension --  did not place patient in prone due to  breathing complication   Right Hip ABduction --  unable to lay on L hip to assess against gravity    Left Hip Flexion 2/5  pain limited    Left Hip Extension --  did not place patient in prone due to breathing complication   Left Hip ABduction 2-/5  pain limited    Right Knee Flexion 3+/5   Right Knee Extension 4-/5   Left Knee Flexion 3/5   Left Knee Extension 3/5   Right Ankle Dorsiflexion 4-/5   Left Ankle Dorsiflexion 3/5   Ambulation/Gait   Gait Comments reduced gait speed, antalgic gait pattern, proximal muscle weakness, flexed at hips, reduced step lengths, dependent on walker due to pain                            PT Education - 06/01/15 1201    Education provided Yes   Education Details prognosis, plan of care, HEP; possible benefits of rollator; possible benefits of bolster at home so she can be more upright in bed to assist with breathing    Person(s) Educated Patient   Methods Explanation;Demonstration;Handout   Comprehension Verbalized understanding          PT Short Term Goals - 06/01/15 1210    PT SHORT TERM GOAL #1   Title Patient will demonstrate improved energy conservation techniques as demonstrated by effective use of rollator during ambulation in her home and in community    Time 3   Period Weeks   Status New   PT SHORT TERM GOAL #2   Title Patient will demonstrate no more than 7/10 pain L hip during functional standing tasks and ambulation    Time 3   Period Weeks   Status New   PT SHORT TERM GOAL #3   Title Patient will be independent in correctly and consistently performing appropriate HEP, to be updated or modified PRN    Time 3   Period Weeks   Status New           PT Long Term Goals - 06/01/15 1213    PT LONG TERM GOAL #1   Title Patient to demonstrate at least 3+/5 strength in proximal musculature and at least 4/5 strength in bilateral LEs    Time 6   Period Weeks   Status New   PT LONG TERM GOAL #2   Title  Patient will experience no more than 5/10 pain L hip with functional standing  tasks and ambulation    Time 6   Status New   PT LONG TERM GOAL #3   Title Patient will be able to complete TUG in 13 seconds or less with LRAD to demonstrate improved balance and reduced fall risk    Time 6   Period Weeks   Status New   PT LONG TERM GOAL #4   Title Patient to have good knowledge of safety during mobilty in her home , and will correctly navigate (on 5/5 attempts) during simulated safety based situations, in order to reduce her overall fall risk with LRAD    Time 6   Period Weeks   Status New               Plan - Jun 03, 2015 1204    Clinical Impression Statement Patient presents status post diagnosis of L hip AVN and DJD; she has severe sarcoidosis and at this time is not able to tolerate surgery due to her respiratory complications as well as due to the medicines she is on for her sarcoidosis. Patient is very pain limited at this time, and presents with significant muscle weakness, gait and postural impairment, balance impairment, and impaired functional mobility and functional task performance at this time. THe patient reports that she would just like to ttry anything that will help her pain, and is intersted in possibly getting rollator at this time. Patient will beneift from skilled PT services in order to attempt to reduce her pain and assist her in reaching an improved level of function.    Pt will benefit from skilled therapeutic intervention in order to improve on the following deficits Abnormal gait;Decreased endurance;Decreased activity tolerance;Decreased strength;Pain;Decreased balance;Difficulty walking;Improper body mechanics;Decreased coordination;Impaired flexibility;Postural dysfunction   Rehab Potential Fair   Clinical Impairments Affecting Rehab Potential severity of diagnosis, complications from sarcoidosis    PT Frequency 2x / week   PT Duration 6 weeks   PT  Treatment/Interventions ADLs/Self Care Home Management;DME Instruction;Gait training;Stair training;Functional mobility training;Therapeutic activities;Therapeutic exercise;Balance training;Neuromuscular re-education;Patient/family education;Passive range of motion;Energy conservation   PT Next Visit Plan review HEP and goals; functional stretching of proximal muscles and LEs as tolerated, balance training   PT Home Exercise Plan given    Consulted and Agree with Plan of Care Patient    Please ask patient to call her MD regarding rollator prescription- he is not on EPIC to message       G-Codes - 06/03/15 1215/11/28    Functional Assessment Tool Used Based on skilled clinical assessment of strength, gait, pain, functional mobility, balance    Functional Limitation Mobility: Walking and moving around   Mobility: Walking and Moving Around Current Status ([id number]) At least 60 percent but less than 80 percent impaired, limited or restricted   Mobility: Walking and Moving Around Goal Status [phone]) At least 40 percent but less than 60 percent impaired, limited or restricted       Problem List Patient Active Problem List   Diagnosis Date Noted  . Avascular necrosis of bone of left hip ([place]) 04/05/2015  . Bimalleolar fracture of right ankle 02/11/2015  . Post herpetic neuralgia 10/22/2014  . Hyperlipidemia 08/25/2014  . Obstructive sleep apnea 02/18/2014  . Obesity, unspecified 05/06/2013  . Fibromyalgia 02/11/2013  . Depression 02/11/2013  . Abdominal pain, other specified site 02/11/2013  . Shingles 02/02/2013  . Urinary retention 02/02/2013  . Pedal edema 01/22/2013  . Orthostatic hypotension 01/16/2013  . Disturbance of skin sensation 11/26/2012  . Pain in limb 11/26/2012  .  Helicobacter pylori gastritis 05/23/2012  . Cough 05/02/2012  . Fibrocystic breast disease in female 04/09/2012  . DOE (dyspnea on exertion) 08/03/2011  . ANXIETY 10/19/2010  . CHEST PAIN 11/22/2008  . Sarcoidosis  ([place]) 06/21/2008    Deniece Ree PT, DPT [phone]  [facility] [address] [place], [place], [address] Phone: [phone]   Fax:  [phone]

## 2015-06-02 ENCOUNTER — Ambulatory Visit (HOSPITAL_COMMUNITY): Payer: 59

## 2015-06-02 DIAGNOSIS — M87052 Idiopathic aseptic necrosis of left femur: Secondary | ICD-10-CM | POA: Diagnosis not present

## 2015-06-02 DIAGNOSIS — M6281 Muscle weakness (generalized): Secondary | ICD-10-CM

## 2015-06-02 DIAGNOSIS — Z9181 History of falling: Secondary | ICD-10-CM

## 2015-06-02 DIAGNOSIS — M25552 Pain in left hip: Secondary | ICD-10-CM

## 2015-06-02 DIAGNOSIS — R2681 Unsteadiness on feet: Secondary | ICD-10-CM

## 2015-06-02 DIAGNOSIS — M1612 Unilateral primary osteoarthritis, left hip: Secondary | ICD-10-CM

## 2015-06-02 DIAGNOSIS — R262 Difficulty in walking, not elsewhere classified: Secondary | ICD-10-CM

## 2015-06-02 NOTE — Therapy (Signed)
[facility] [address] [place], [place], [address] Phone: [phone]   Fax:  [phone]  Physical Therapy Treatment  Patient Details  [name]: Kathleen [name] MRN: [id number] Date of Birth: [birth year] Referring Provider:  [name], MD  Encounter Date: 06/02/2015      PT End of Session - 06/02/15 1734    Visit Number 2   Number of Visits 12   Date for PT Re-Evaluation 06/29/15   Authorization Type MCR and UHC UMR    Authorization Time Period 06/01/15 to 08/01/15   Authorization - Visit Number 2   Authorization - Number of Visits 10   PT Start Time 2122   PT Stop Time 1730   PT Time Calculation (min) 40 min   Activity Tolerance Patient limited by pain   Behavior During Therapy [facility] for tasks assessed/performed      Past Medical History  Diagnosis Date  . Sarcoidosis   . Breast fibrocystic disorder   . Mitral valve prolapse   . Breast pain     right  . Normal stress echocardiogram Jan 2011  . Normal nuclear stress test December 2012  . Normal echocardiogram December 2012    EF 55 to 60%; mild MVP, grade 1 diastolic dysfunction, PAP 32  . Fibromyalgia   . DOE (dyspnea on exertion)   . Pulmonary fibrosis   . Gastroesophageal reflux disease   . Heart disease   . Anemia     Past Surgical History  Procedure Laterality Date  . Tubal ligation    . Breast lumpectomy  01/12/2011    right    There were no vitals filed for this visit.  Visit Diagnosis:  Avascular necrosis of bone of hip, left ([facility])  Osteoarthritis of left hip, unspecified osteoarthritis type  Muscle weakness  Left hip pain  Difficulty walking  At risk for falling  Unsteadiness      Subjective Assessment - 06/02/15 1704    Subjective Pt stated she cannot sit or stand for any length of time without increased pain.     Pertinent History Patient was first diagnosed with AVN of L hip around 6-8 months ago; she did not know what was going on,  just thought she had a sore hip. Cannot have a hip replacement due to medicine she is on for her lungs, she is not able to handle the anesthetic. Cannot stand pain in sitting or laying- really cannot find a comfortable position. She has tried to do things at home on her own but cannot tell a difference in it yet.    Currently in Pain? Yes   Pain Score 9    Pain Location Hip   Pain Orientation Left   Pain Descriptors / Indicators Aching;Sore           [facility] Adult PT Treatment/Exercise - 06/02/15 0001    Exercises   Exercises Knee/Hip   Knee/Hip Exercises: Standing   Gait Training Gait training x 80 feet with appropriate height RW   Knee/Hip Exercises: Seated   Long Arc Quad Both;10 reps   Knee/Hip Exercises: Supine   Bridges Both;10 reps   Straight Leg Raises Right;10 reps;Left;Limitations   Straight Leg Raises Limitations unable to complete Lt LE (bent knee raise)   Other Supine Knee/Hip Exercises Decompression exercise   Other Supine Knee/Hip Exercises Ab sets 10x5"; Bent knee raise 10x 5"   Knee/Hip Exercises: Sidelying   Clams Lt 10x 5"   Manual Therapy  Manual Therapy Joint mobilization   Joint Mobilization Lt hip distractions 5x 30"            PT Education - 06/01/15 1201    Education provided Yes   Education Details prognosis, plan of care, HEP; possible benefits of rollator; possible benefits of bolster at home so she can be more upright in bed to assist with breathing    Person(s) Educated Patient   Methods Explanation;Demonstration;Handout   Comprehension Verbalized understanding          PT Short Term Goals - 06/01/15 1210    PT SHORT TERM GOAL #1   Title Patient will demonstrate improved energy conservation techniques as demonstrated by effective use of rollator during ambulation in her home and in community    Time 3   Period Weeks   Status New   PT SHORT TERM GOAL #2   Title Patient will demonstrate no more than 7/10 pain L hip during functional  standing tasks and ambulation    Time 3   Period Weeks   Status New   PT SHORT TERM GOAL #3   Title Patient will be independent in correctly and consistently performing appropriate HEP, to be updated or modified PRN    Time 3   Period Weeks   Status New           PT Long Term Goals - 06/01/15 1213    PT LONG TERM GOAL #1   Title Patient to demonstrate at least 3+/5 strength in proximal musculature and at least 4/5 strength in bilateral LEs    Time 6   Period Weeks   Status New   PT LONG TERM GOAL #2   Title Patient will experience no more than 5/10 pain L hip with functional standing tasks and ambulation    Time 6   Status New   PT LONG TERM GOAL #3   Title Patient will be able to complete TUG in 13 seconds or less with LRAD to demonstrate improved balance and reduced fall risk    Time 6   Period Weeks   Status New   PT LONG TERM GOAL #4   Title Patient to have good knowledge of safety during mobilty in her home , and will correctly navigate (on 5/5 attempts) during simulated safety based situations, in order to reduce her overall fall risk with LRAD    Time 6   Period Weeks   Status New               Plan - 06/02/15 1735    Clinical Impression Statement Reviewed goals, compliance with HEP and copy of evaluation given to pt.  Pt entered dept ambulating with RW that was too short, pt explained importance of ambulating with proper height RW.  Unable to adjust her current walker, pt. stated she has higher walker at home and will bring it next session.  Session focus on improving promximal musculature with cueing for proper form and technique.  Trial with hip distraction with no reports of pain relief.  End of session pt limited by fatigue, reported no increased pain through session though does continue to have high pain scale.     PT Next Visit Plan Continue with current PT POC:  functional stretching of proximal muscles and LEs as tolerated, balance training        Problem List Patient Active Problem List   Diagnosis Date Noted  . Avascular necrosis of bone of left hip ([place]) 04/05/2015  .  Bimalleolar fracture of right ankle 02/11/2015  . Post herpetic neuralgia 10/22/2014  . Hyperlipidemia 08/25/2014  . Obstructive sleep apnea 02/18/2014  . Obesity, unspecified 05/06/2013  . Fibromyalgia 02/11/2013  . Depression 02/11/2013  . Abdominal pain, other specified site 02/11/2013  . Shingles 02/02/2013  . Urinary retention 02/02/2013  . Pedal edema 01/22/2013  . Orthostatic hypotension 01/16/2013  . Disturbance of skin sensation 11/26/2012  . Pain in limb 11/26/2012  . Helicobacter pylori gastritis 05/23/2012  . Cough 05/02/2012  . Fibrocystic breast disease in female 04/09/2012  . DOE (dyspnea on exertion) 08/03/2011  . ANXIETY 10/19/2010  . CHEST PAIN 11/22/2008  . Sarcoidosis [facility]) 06/21/2008   Ihor Austin, [place]; [place]  Aldona Lento 06/02/2015, 5:43 PM  [facility] [address] [place], [place], [address] Phone: [phone]   Fax:  [phone]

## 2015-06-03 ENCOUNTER — Encounter (HOSPITAL_COMMUNITY): Payer: Self-pay | Admitting: Psychiatry

## 2015-06-03 ENCOUNTER — Ambulatory Visit (INDEPENDENT_AMBULATORY_CARE_PROVIDER_SITE_OTHER): Payer: 59 | Admitting: Psychiatry

## 2015-06-03 VITALS — BP 136/58 | HR 84 | Ht 68.0 in | Wt 204.4 lb

## 2015-06-03 DIAGNOSIS — F411 Generalized anxiety disorder: Secondary | ICD-10-CM | POA: Diagnosis not present

## 2015-06-03 DIAGNOSIS — F32A Depression, unspecified: Secondary | ICD-10-CM

## 2015-06-03 DIAGNOSIS — F329 Major depressive disorder, single episode, unspecified: Secondary | ICD-10-CM

## 2015-06-03 MED ORDER — ALPRAZOLAM 1 MG PO TABS: 0.5000 mg | ORAL_TABLET | Freq: Three times a day (TID) | ORAL | Status: DC | PRN

## 2015-06-03 MED ORDER — BUPROPION HCL ER (XL) 150 MG PO TB24: 150.0000 mg | ORAL_TABLET | ORAL | Status: DC

## 2015-06-03 MED ORDER — DULOXETINE HCL 60 MG PO CPEP: 120.0000 mg | ORAL_CAPSULE | Freq: Every day | ORAL | Status: DC
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
## 2015-06-07 ENCOUNTER — Telehealth (HOSPITAL_COMMUNITY): Payer: Self-pay

## 2015-06-07 ENCOUNTER — Ambulatory Visit (HOSPITAL_COMMUNITY): Payer: 59

## 2015-06-07 DIAGNOSIS — R2681 Unsteadiness on feet: Secondary | ICD-10-CM

## 2015-06-07 DIAGNOSIS — M87052 Idiopathic aseptic necrosis of left femur: Secondary | ICD-10-CM | POA: Diagnosis not present

## 2015-06-07 DIAGNOSIS — R262 Difficulty in walking, not elsewhere classified: Secondary | ICD-10-CM

## 2015-06-07 DIAGNOSIS — Z9181 History of falling: Secondary | ICD-10-CM

## 2015-06-07 DIAGNOSIS — M6281 Muscle weakness (generalized): Secondary | ICD-10-CM

## 2015-06-07 DIAGNOSIS — M1612 Unilateral primary osteoarthritis, left hip: Secondary | ICD-10-CM

## 2015-06-07 DIAGNOSIS — M25552 Pain in left hip: Secondary | ICD-10-CM

## 2015-06-07 NOTE — Therapy (Signed)
[facility] [address] [place], [place], [address] Phone: [phone]   Fax:  [phone]  Physical Therapy Treatment  Patient Details  Name: Kathleen [name] MRN: [id number] Date of Birth: 1961-03-18 Referring Provider:  Patrina Levering, MD  Encounter Date: 06/07/2015      PT End of Session - 06/07/15 1030    Visit Number 3   Number of Visits 12   Date for PT Re-Evaluation 06/29/15   Authorization Type MCR and UHC UMR    Authorization Time Period 06/01/15 to 08/01/15   Authorization - Visit Number 3   Authorization - Number of Visits 10   PT Start Time 0935   PT Stop Time 1020   PT Time Calculation (min) 45 min   Equipment Utilized During Treatment Gait belt   Activity Tolerance Patient limited by pain   Behavior During Therapy [facility] for tasks assessed/performed      Past Medical History  Diagnosis Date  . Sarcoidosis ([place])   . Breast fibrocystic disorder   . Mitral valve prolapse   . Breast pain     right  . Normal stress echocardiogram Jan 2011  . Normal nuclear stress test December 2012  . Normal echocardiogram December 2012    EF 55 to 60%; mild MVP, grade 1 diastolic dysfunction, PAP 32  . Fibromyalgia   . DOE (dyspnea on exertion)   . Pulmonary fibrosis ([place])   . Gastroesophageal reflux disease   . Heart disease   . Anemia     Past Surgical History  Procedure Laterality Date  . Tubal ligation    . Breast lumpectomy  01/12/2011    right    There were no vitals filed for this visit.  Visit Diagnosis:  Avascular necrosis of bone of hip, left (HCC)  Osteoarthritis of left hip, unspecified osteoarthritis type  Muscle weakness  Left hip pain  Difficulty walking  At risk for falling  Unsteadiness      Subjective Assessment - 06/07/15 0938    Subjective Pt entered dept with short RW stateing she was unable to fold up higher walker to put in vehicle, current pain scale 8-9/10 Lt hip.  Reportes  difficulty breathing level 5/10 today.   Currently in Pain? Yes   Pain Score 9    Pain Location Hip   Pain Orientation Left   Pain Descriptors / Indicators Aching;Sore           [facility] Adult PT Treatment/Exercise - 06/07/15 0001    Exercises   Exercises Knee/Hip   Knee/Hip Exercises: Stretches   Passive Hamstring Stretch 3 reps;30 seconds   Passive Hamstring Stretch Limitations supine   Other Knee/Hip Stretches Trial with ITB stretch, too painful   Knee/Hip Exercises: Standing   Heel Raises Both;10 reps   Hip Abduction Both;10 reps   Hip Extension Both;10 reps   Other Standing Knee Exercises 3x 20" Tandem stance   Knee/Hip Exercises: Seated   Long Arc Quad Both;15 reps   Long Arc Quad Weight 2 lbs.   Knee/Hip Exercises: Supine   Bridges Both;10 reps   Straight Leg Raises Right;AROM;10 reps;AAROM;Left   Other Supine Knee/Hip Exercises Bent knee raise 15x 5"   Knee/Hip Exercises: Sidelying   Hip ABduction AAROM;Left;10 reps   Manual Therapy   Manual Therapy Myofascial release   Myofascial Release Lt ITB gentle                  PT Short Term Goals -  06/01/15 1210    PT SHORT TERM GOAL #1   Title Patient will demonstrate improved energy conservation techniques as demonstrated by effective use of rollator during ambulation in her home and in community    Time 3   Period Weeks   Status New   PT SHORT TERM GOAL #2   Title Patient will demonstrate no more than 7/10 pain L hip during functional standing tasks and ambulation    Time 3   Period Weeks   Status New   PT SHORT TERM GOAL #3   Title Patient will be independent in correctly and consistently performing appropriate HEP, to be updated or modified PRN    Time 3   Period Weeks   Status New           PT Long Term Goals - 06/01/15 1213    PT LONG TERM GOAL #1   Title Patient to demonstrate at least 3+/5 strength in proximal musculature and at least 4/5 strength in bilateral LEs    Time 6   Period  Weeks   Status New   PT LONG TERM GOAL #2   Title Patient will experience no more than 5/10 pain L hip with functional standing tasks and ambulation    Time 6   Status New   PT LONG TERM GOAL #3   Title Patient will be able to complete TUG in 13 seconds or less with LRAD to demonstrate improved balance and reduced fall risk    Time 6   Period Weeks   Status New   PT LONG TERM GOAL #4   Title Patient to have good knowledge of safety during mobilty in her home , and will correctly navigate (on 5/5 attempts) during simulated safety based situations, in order to reduce her overall fall risk with LRAD    Time 6   Period Weeks   Status New               Plan - 06/07/15 1033    Clinical Impression Statement Session focus on strengthening proximal musculature and balance training.  Pt able to complete SLR in supine and sidelying with AAROM for strengthening though limited by pain with all exercises.  Progressed to standing exercises with abiltiy to complete standing abduction and extension with cueing for proper form and limited by pain with movements, able to tolerate weight bearing with no c/o increased pain.  Pt reported a lot of pain with palpation to ITBand, unable to tolerate passive stretch.  Began balance training with pt. expression of fear of falling though no LOB episodes through session.  Discussion held about appropriate walker height, believe pt would benefit from rollator due to SOB with gait and pain limiting ability to ambulatue any distance and fear of falling.  Called MD Lobo's office and faxed referral for Rollator.   PT Next Visit Plan F/U on rollator referral.  Continue with current PT POC:  functional stretching of proximal muscles and LEs as tolerated, balance training        Problem List Patient Active Problem List   Diagnosis Date Noted  . Avascular necrosis of bone of left hip ([place]) 04/05/2015  . Bimalleolar fracture of right ankle 02/11/2015  . Post herpetic  neuralgia 10/22/2014  . Hyperlipidemia 08/25/2014  . Obstructive sleep apnea 02/18/2014  . Obesity, unspecified 05/06/2013  . Fibromyalgia 02/11/2013  . Depression 02/11/2013  . Abdominal pain, other specified site 02/11/2013  . Shingles 02/02/2013  . Urinary retention 02/02/2013  .  Pedal edema 01/22/2013  . Orthostatic hypotension 01/16/2013  . Disturbance of skin sensation 11/26/2012  . Pain in limb 11/26/2012  . Helicobacter pylori gastritis 05/23/2012  . Cough 05/02/2012  . Fibrocystic breast disease in female 04/09/2012  . DOE (dyspnea on exertion) 08/03/2011  . ANXIETY 10/19/2010  . CHEST PAIN 11/22/2008  . Sarcoidosis [facility]) 06/21/2008   Ihor Austin, [place]; [place]  Aldona Lento 06/07/2015, 10:52 AM  [facility] [place], [place], [address] Phone: [phone]   Fax:  [phone]

## 2015-06-08 ENCOUNTER — Ambulatory Visit (HOSPITAL_COMMUNITY): Payer: 59 | Admitting: Physical Therapy

## 2015-06-08 DIAGNOSIS — M87052 Idiopathic aseptic necrosis of left femur: Secondary | ICD-10-CM

## 2015-06-08 DIAGNOSIS — M25552 Pain in left hip: Secondary | ICD-10-CM

## 2015-06-08 DIAGNOSIS — M1612 Unilateral primary osteoarthritis, left hip: Secondary | ICD-10-CM

## 2015-06-08 DIAGNOSIS — R2681 Unsteadiness on feet: Secondary | ICD-10-CM

## 2015-06-08 DIAGNOSIS — R262 Difficulty in walking, not elsewhere classified: Secondary | ICD-10-CM

## 2015-06-08 DIAGNOSIS — Z9181 History of falling: Secondary | ICD-10-CM

## 2015-06-08 DIAGNOSIS — M6281 Muscle weakness (generalized): Secondary | ICD-10-CM

## 2015-06-08 NOTE — Therapy (Signed)
[place], [place], [address] Phone: [phone]   Fax:  [phone]  Physical Therapy Treatment  Patient Details  Name: Kathleen [name] MRN: [id number] Date of Birth: 07-03-1961 Referring Provider:  Patrina Levering, MD  Encounter Date: 06/08/2015      PT End of Session - 06/08/15 1614    Visit Number 4   Number of Visits 12   Date for PT Re-Evaluation 06/29/15   Authorization Type MCR and UHC UMR    Authorization Time Period 06/01/15 to 08/01/15   Authorization - Visit Number 4   Authorization - Number of Visits 10   PT Start Time 7824   PT Stop Time 1556   PT Time Calculation (min) 40 min   Activity Tolerance Patient limited by pain   Behavior During Therapy [facility] for tasks assessed/performed      Past Medical History  Diagnosis Date  . Sarcoidosis ([place])   . Breast fibrocystic disorder   . Mitral valve prolapse   . Breast pain     right  . Normal stress echocardiogram Jan 2011  . Normal nuclear stress test December 2012  . Normal echocardiogram December 2012    EF 55 to 60%; mild MVP, grade 1 diastolic dysfunction, PAP 32  . Fibromyalgia   . DOE (dyspnea on exertion)   . Pulmonary fibrosis ([place])   . Gastroesophageal reflux disease   . Heart disease   . Anemia     Past Surgical History  Procedure Laterality Date  . Tubal ligation    . Breast lumpectomy  01/12/2011    right    There were no vitals filed for this visit.  Visit Diagnosis:  Avascular necrosis of bone of hip, left (HCC)  Osteoarthritis of left hip, unspecified osteoarthritis type  Muscle weakness  Left hip pain  Difficulty walking  At risk for falling  Unsteadiness      Subjective Assessment - 06/08/15 1519    Subjective Patient reports that she is hurting today, with focus of pain still in L hip; looking forward to getting rollator. Breathing difficulty at 5/10 today.    Pertinent History Patient was first  diagnosed with AVN of L hip around 6-8 months ago; she did not know what was going on, just thought she had a sore hip. Cannot have a hip replacement due to medicine she is on for her lungs, she is not able to handle the anesthetic. Cannot stand pain in sitting or laying- really cannot find a comfortable position. She has tried to do things at home on her own but cannot tell a difference in it yet.    Currently in Pain? Yes   Pain Score 8    Pain Location Hip   Pain Orientation Left                         [facility] Adult PT Treatment/Exercise - 06/08/15 0001    Knee/Hip Exercises: Standing   Heel Raises Both;10 reps   Heel Raises Limitations heel and toe raises    Other Standing Knee Exercises Mini-squats 1x10 in parallel bars    Knee/Hip Exercises: Seated   Long Arc Quad Both;15 reps   Long Arc Quad Weight 2 lbs.   Heel Slides Both;1 set;15 reps   Heel Slides Limitations 2   Ball Squeeze 1x15   Other Seated Knee/Hip Exercises Hip ABD 1x15, light manual resistance; ankle PF/DF 1x15  Knee/Hip Exercises: Supine   Bridges Both;10 reps   Straight Leg Raises Right;AROM;Left;AAROM;Both;1 set;10 reps   Other Supine Knee/Hip Exercises Bent knee raises 1x10   Other Supine Knee/Hip Exercises Ab sets 1x20 with 3 second hold; supine hip ABD AAROM L LE  1x10              Balance Exercises - 06/08/15 1612    Balance Exercises: Standing   Standing Eyes Opened Narrow base of support (BOS);Solid surface;3 reps;20 secs;Other (comment)  Min(A) to maintain balance    Tandem Stance Eyes open;2 reps;10 secs;Other (comment)  Min-Mod(A) to maintain balance           PT Education - 06/08/15 1613    Education provided Yes   Education Details DOMS; status of request for rollator from MD; encouraged patient to work IT band at home    Person(s) Educated Patient   Methods Explanation   Comprehension Verbalized understanding          PT Short Term Goals - 06/01/15 1210    PT  SHORT TERM GOAL #1   Title Patient will demonstrate improved energy conservation techniques as demonstrated by effective use of rollator during ambulation in her home and in community    Time 3   Period Weeks   Status New   PT SHORT TERM GOAL #2   Title Patient will demonstrate no more than 7/10 pain L hip during functional standing tasks and ambulation    Time 3   Period Weeks   Status New   PT SHORT TERM GOAL #3   Title Patient will be independent in correctly and consistently performing appropriate HEP, to be updated or modified PRN    Time 3   Period Weeks   Status New           PT Long Term Goals - 06/01/15 1213    PT LONG TERM GOAL #1   Title Patient to demonstrate at least 3+/5 strength in proximal musculature and at least 4/5 strength in bilateral LEs    Time 6   Period Weeks   Status New   PT LONG TERM GOAL #2   Title Patient will experience no more than 5/10 pain L hip with functional standing tasks and ambulation    Time 6   Status New   PT LONG TERM GOAL #3   Title Patient will be able to complete TUG in 13 seconds or less with LRAD to demonstrate improved balance and reduced fall risk    Time 6   Period Weeks   Status New   PT LONG TERM GOAL #4   Title Patient to have good knowledge of safety during mobilty in her home , and will correctly navigate (on 5/5 attempts) during simulated safety based situations, in order to reduce her overall fall risk with LRAD    Time 6   Period Weeks   Status New               Plan - 06/08/15 1616    Clinical Impression Statement Continued functional exercises and manual to ITB today, also introduced patient to ways to mobilize ITB at home. Also introduced standing balance activities today, which patient demonsrated great difficulty with.    Pt will benefit from skilled therapeutic intervention in order to improve on the following deficits Abnormal gait;Decreased endurance;Decreased activity tolerance;Decreased  strength;Pain;Decreased balance;Difficulty walking;Improper body mechanics;Decreased coordination;Impaired flexibility;Postural dysfunction   Rehab Potential Fair   Clinical Impairments Affecting Rehab Potential severity of  diagnosis, complications from sarcoidosis    PT Frequency 2x / week   PT Duration 6 weeks   PT Treatment/Interventions ADLs/Self Care Home Management;DME Instruction;Gait training;Stair training;Functional mobility training;Therapeutic activities;Therapeutic exercise;Balance training;Neuromuscular re-education;Patient/family education;Passive range of motion;Energy conservation   PT Next Visit Plan F/U on rollator referral.  Continue with current PT POC:  functional stretching of proximal muscles and LEs as tolerated, balance training   PT Home Exercise Plan given    Consulted and Agree with Plan of Care Patient        Problem List Patient Active Problem List   Diagnosis Date Noted  . Avascular necrosis of bone of left hip ([place]) 04/05/2015  . Bimalleolar fracture of right ankle 02/11/2015  . Post herpetic neuralgia 10/22/2014  . Hyperlipidemia 08/25/2014  . Obstructive sleep apnea 02/18/2014  . Obesity, unspecified 05/06/2013  . Fibromyalgia 02/11/2013  . Depression 02/11/2013  . Abdominal pain, other specified site 02/11/2013  . Shingles 02/02/2013  . Urinary retention 02/02/2013  . Pedal edema 01/22/2013  . Orthostatic hypotension 01/16/2013  . Disturbance of skin sensation 11/26/2012  . Pain in limb 11/26/2012  . Helicobacter pylori gastritis 05/23/2012  . Cough 05/02/2012  . Fibrocystic breast disease in female 04/09/2012  . DOE (dyspnea on exertion) 08/03/2011  . ANXIETY 10/19/2010  . CHEST PAIN 11/22/2008  . Sarcoidosis ([place]) 06/21/2008    Deniece Ree PT, DPT [phone]  [place] [address] [place], [place], [address] Phone: [phone]   Fax:  [phone]

## 2015-06-09 ENCOUNTER — Telehealth (HOSPITAL_COMMUNITY): Payer: Self-pay | Admitting: *Deleted

## 2015-06-09 NOTE — Telephone Encounter (Signed)
Pt pharmacy sent request for refills for pt Xanax. Informed Romie Minus that per pt chart, pt was given a script on 06-03-15. Romie Minus showed understanding and stated thank you for informing us

## 2015-06-15 ENCOUNTER — Telehealth (HOSPITAL_COMMUNITY): Payer: Self-pay | Admitting: Physical Therapy

## 2015-06-15 ENCOUNTER — Ambulatory Visit (HOSPITAL_COMMUNITY): Payer: 59 | Admitting: Physical Therapy

## 2015-06-15 NOTE — Telephone Encounter (Signed)
Patient a no-show for today's appointment. Attempted to call however no one answered phone and voicemail box was not functioning correctly, prevent PT from leaving message.   Deniece Ree PT, DPT [phone]

## 2015-06-16 ENCOUNTER — Ambulatory Visit (HOSPITAL_COMMUNITY): Payer: 59

## 2015-06-16 DIAGNOSIS — M87052 Idiopathic aseptic necrosis of left femur: Secondary | ICD-10-CM | POA: Diagnosis not present

## 2015-06-16 NOTE — Therapy (Signed)
[place] [address] [place], [place], [address] Phone: [phone]   Fax:  [phone]  Physical Therapy Treatment  Patient Details  Name: Kathleen [name] MRN: [id number] Date of Birth: 1960/11/15 No Data Recorded  Encounter Date: 06/16/2015      PT End of Session - 06/16/15 1612    Visit Number 5   Number of Visits 12   Date for PT Re-Evaluation 06/29/15   Authorization Type MCR and UHC UMR    Authorization Time Period 06/01/15 to 08/01/15   Authorization - Visit Number 5   Authorization - Number of Visits 10   PT Start Time 3810   PT Stop Time 1600   PT Time Calculation (min) 30 min   Activity Tolerance No increased pain   Behavior During Therapy [facility] for tasks assessed/performed      Past Medical History  Diagnosis Date  . Sarcoidosis ([place])   . Breast fibrocystic disorder   . Mitral valve prolapse   . Breast pain     right  . Normal stress echocardiogram Jan 2011  . Normal nuclear stress test December 2012  . Normal echocardiogram December 2012    EF 55 to 60%; mild MVP, grade 1 diastolic dysfunction, PAP 32  . Fibromyalgia   . DOE (dyspnea on exertion)   . Pulmonary fibrosis ([place])   . Gastroesophageal reflux disease   . Heart disease   . Anemia     Past Surgical History  Procedure Laterality Date  . Tubal ligation    . Breast lumpectomy  01/12/2011    right    There were no vitals filed for this visit.  Visit Diagnosis:  Avascular necrosis of bone of hip, left (HCC)  Osteoarthritis of left hip, unspecified osteoarthritis type  Muscle weakness  Left hip pain  Difficulty walking  At risk for falling  Unsteadiness      Subjective Assessment - 06/16/15 1656    Subjective Pt forgot apt today, called and pt close by so made it to apt but late for session.  Pt reports increased stress due to her father having stroke and high pain scale with hip 9/10.  Pt stated her back is starting to hurt from  walking with RW that is too short, pt looking forward to getting rollator.     Currently in Pain? Yes   Pain Score 9    Pain Location Hip           [facility] Adult PT Treatment/Exercise - 06/16/15 0001    Exercises   Exercises Knee/Hip   Knee/Hip Exercises: Stretches   Passive Hamstring Stretch 3 reps;30 seconds   Passive Hamstring Stretch Limitations supine   Knee/Hip Exercises: Seated   Long Arc Quad Both;15 reps   Long Arc Quad Weight 2 lbs.   Other Seated Knee/Hip Exercises Diagraphmatic breathing   Knee/Hip Exercises: Supine   Heel Slides 10 reps   Bridges Both;10 reps   Other Supine Knee/Hip Exercises Bent knee raises 1x10   Other Supine Knee/Hip Exercises Ab sets 1x20 with 3 second hold; supine hip ABD AAROM L LE  1x10    Manual Therapy   Manual Therapy Myofascial release   Myofascial Release Lt hip and ITB gentle           PT Short Term Goals - 06/01/15 1210    PT SHORT TERM GOAL #1   Title Patient will demonstrate improved energy conservation techniques as demonstrated by effective use of rollator during  ambulation in her home and in community    Time 3   Period Weeks   Status New   PT SHORT TERM GOAL #2   Title Patient will demonstrate no more than 7/10 pain L hip during functional standing tasks and ambulation    Time 3   Period Weeks   Status New   PT SHORT TERM GOAL #3   Title Patient will be independent in correctly and consistently performing appropriate HEP, to be updated or modified PRN    Time 3   Period Weeks   Status New           PT Long Term Goals - 06/01/15 1213    PT LONG TERM GOAL #1   Title Patient to demonstrate at least 3+/5 strength in proximal musculature and at least 4/5 strength in bilateral LEs    Time 6   Period Weeks   Status New   PT LONG TERM GOAL #2   Title Patient will experience no more than 5/10 pain L hip with functional standing tasks and ambulation    Time 6   Status New   PT LONG TERM GOAL #3   Title Patient  will be able to complete TUG in 13 seconds or less with LRAD to demonstrate improved balance and reduced fall risk    Time 6   Period Weeks   Status New   PT LONG TERM GOAL #4   Title Patient to have good knowledge of safety during mobilty in her home , and will correctly navigate (on 5/5 attempts) during simulated safety based situations, in order to reduce her overall fall risk with LRAD    Time 6   Period Weeks   Status New               Plan - 06/16/15 1700    Clinical Impression Statement Pt late for apt and limited by high pain scale Lt hip with reports of increased difficulty sleeping at night.  Pt stress level very high this session as her father had stroke yesterday.  Session focus on attempt at pain control with manual to reduce spasms Lt piriformis and IT band, strenghtening per pain tolerance and self care techniques to reduce stress for pain control.  Pt stated she now has increased lower back pain from ambulating with inapproraite walker height.  Called MD office during session and spoke to physician about referral, referral was faxed again (3rd attempt) for appropriate walker height to assist with pain control.  Vital signs taken following reports of difficulty breathing and instructed diagraphmatic breathing to improve deep breathing for stress control.  BP 125/68 mmHG, HR 86 and O2 sat at 97%.  Pt limited by pain whole session, reports pain scale at 8-9/10 at end of session.     PT Next Visit Plan F/U on rollator referral.  Continue with current PT POC:  functional stretching of proximal muscles and LEs as tolerated, balance training        Problem List Patient Active Problem List   Diagnosis Date Noted  . Avascular necrosis of bone of left hip ([place]) 04/05/2015  . Bimalleolar fracture of right ankle 02/11/2015  . Post herpetic neuralgia 10/22/2014  . Hyperlipidemia 08/25/2014  . Obstructive sleep apnea 02/18/2014  . Obesity, unspecified 05/06/2013  . Fibromyalgia  02/11/2013  . Depression 02/11/2013  . Abdominal pain, other specified site 02/11/2013  . Shingles 02/02/2013  . Urinary retention 02/02/2013  . Pedal edema 01/22/2013  . Orthostatic hypotension  01/16/2013  . Disturbance of skin sensation 11/26/2012  . Pain in limb 11/26/2012  . Helicobacter pylori gastritis 05/23/2012  . Cough 05/02/2012  . Fibrocystic breast disease in female 04/09/2012  . DOE (dyspnea on exertion) 08/03/2011  . ANXIETY 10/19/2010  . CHEST PAIN 11/22/2008  . Sarcoidosis [facility]) 06/21/2008   Ihor Austin, [place]; [place]  Aldona Lento 06/16/2015, 5:10 PM  [place], [place], [address] Phone: [phone]   Fax:  [phone]  Name: Kathleen [name] MRN: [id number] Date of Birth: 01-26-1961

## 2015-06-21 NOTE — Telephone Encounter (Signed)
Called and informed pt that we have received signed referral for Rollator, reminded pt of next apt date and time.    [address] [place]; [facility] [phone]

## 2015-06-22 ENCOUNTER — Ambulatory Visit (HOSPITAL_COMMUNITY): Payer: 59 | Admitting: Physical Therapy

## 2015-06-22 DIAGNOSIS — M1612 Unilateral primary osteoarthritis, left hip: Secondary | ICD-10-CM

## 2015-06-22 DIAGNOSIS — Z9181 History of falling: Secondary | ICD-10-CM

## 2015-06-22 DIAGNOSIS — R262 Difficulty in walking, not elsewhere classified: Secondary | ICD-10-CM

## 2015-06-22 DIAGNOSIS — M87052 Idiopathic aseptic necrosis of left femur: Secondary | ICD-10-CM

## 2015-06-22 DIAGNOSIS — M6281 Muscle weakness (generalized): Secondary | ICD-10-CM

## 2015-06-22 DIAGNOSIS — M25552 Pain in left hip: Secondary | ICD-10-CM

## 2015-06-22 DIAGNOSIS — R2681 Unsteadiness on feet: Secondary | ICD-10-CM

## 2015-06-22 NOTE — Patient Instructions (Signed)
Bent Leg Lift (Hook-Lying)    Tighten stomach and slowly raise right leg __2__ inches from floor. Keep trunk rigid. Hold __2__ seconds. Repeat _10___ times per set. Do _1___ sets per session. Do _2___ sessions per day.  http://orth.exer.us/1090   Copyright  VHI. All rights reserved.  Hip Abduction / Adduction: with Knee Flexion (Supine)    With right knee bent, gently lower knee to side and return. Repeat __10__ times per set. Do __1__ sets per session. Do __2__ sessions per day.  http://orth.exer.us/682   Copyright  VHI. All rights reserved.  Strengthening: Hip Adduction - Isometric    With ball or folded pillow between knees, squeeze knees together. Hold ___5_ seconds. Repeat _10___ times per set. Do __1__ sets per session. Do ___2_ sessions per day.  http://orth.exer.us/612   Copyright  VHI. All rights reserved.  Strengthening: Hip Abduction - Isometric    Using ball or folded pillow, push outside of right knee into wall. Hold ___5_ seconds. Repeat __10__ times per set. Do _1___ sets per session. Do __2__ sessions per day.  http://orth.exer.us/610   Copyright  VHI. All rights reserved.

## 2015-06-22 NOTE — Therapy (Signed)
[place] [address] [place], [place], [address] Phone: [phone]   Fax:  [phone]  Physical Therapy Treatment  Patient Details  Name: Kathleen [name] MRN: [id number] Date of Birth: October 10, 1960 No Data Recorded  Encounter Date: 06/22/2015      PT End of Session - 06/22/15 1520    Visit Number 6   Number of Visits 12   Date for PT Re-Evaluation 06/29/15   Authorization - Visit Number 6   Authorization - Number of Visits 10   PT Start Time 2409   PT Stop Time 1520   PT Time Calculation (min) 35 min   Equipment Utilized During Treatment Gait belt   Activity Tolerance Patient tolerated treatment well   Behavior During Therapy [facility] for tasks assessed/performed      Past Medical History  Diagnosis Date  . Sarcoidosis ([place])   . Breast fibrocystic disorder   . Mitral valve prolapse   . Breast pain     right  . Normal stress echocardiogram Jan 2011  . Normal nuclear stress test December 2012  . Normal echocardiogram December 2012    EF 55 to 60%; mild MVP, grade 1 diastolic dysfunction, PAP 32  . Fibromyalgia   . DOE (dyspnea on exertion)   . Pulmonary fibrosis ([place])   . Gastroesophageal reflux disease   . Heart disease   . Anemia     Past Surgical History  Procedure Laterality Date  . Tubal ligation    . Breast lumpectomy  01/12/2011    right    There were no vitals filed for this visit.  Visit Diagnosis:  Avascular necrosis of bone of hip, left (HCC)  Osteoarthritis of left hip, unspecified osteoarthritis type  Muscle weakness  Left hip pain  Difficulty walking  At risk for falling  Unsteadiness      Subjective Assessment - 06/22/15 1445    Subjective Pt states her walker can not be adjusted and is to short,(pt given new referral for rollator);   Currently in Pain? Yes   Pain Score 8    Pain Location Hip   Pain Orientation Left                         [facility] Adult PT  Treatment/Exercise - 06/22/15 0001    Exercises   Exercises Knee/Hip   Knee/Hip Exercises: Stretches   Active Hamstring Stretch Left;3 reps;30 seconds   Active Hamstring Stretch Limitations supine    Other Knee/Hip Stretches adductor stretch 5 x 20"    Knee/Hip Exercises: Aerobic   Nustep level 1 x 3'    Knee/Hip Exercises: Standing   Heel Raises Both;10 reps   Other Standing Knee Exercises Mini-squats 1x10 in parallel bars    Knee/Hip Exercises: Seated   Long Arc Quad Both;15 reps;Weights   Long Arc Quad Weight 4 lbs.   Knee/Hip Exercises: Supine   Bridges 10 reps   Straight Leg Raises Strengthening;Right;10 reps   Other Supine Knee/Hip Exercises clam and isometric adduction x 10    Other Supine Knee/Hip Exercises bent knee raise x 10                 PT Education - 06/22/15 1520    Education provided Yes   Education Details additional HEP   Person(s) Educated Patient   Methods Explanation;Tactile cues;Verbal cues;Handout   Comprehension Returned demonstration;Verbalized understanding          PT Short Term  Goals - 06/01/15 1210    PT SHORT TERM GOAL #1   Title Patient will demonstrate improved energy conservation techniques as demonstrated by effective use of rollator during ambulation in her home and in community    Time 3   Period Weeks   Status New   PT SHORT TERM GOAL #2   Title Patient will demonstrate no more than 7/10 pain L hip during functional standing tasks and ambulation    Time 3   Period Weeks   Status New   PT SHORT TERM GOAL #3   Title Patient will be independent in correctly and consistently performing appropriate HEP, to be updated or modified PRN    Time 3   Period Weeks   Status New           PT Long Term Goals - 06/01/15 1213    PT LONG TERM GOAL #1   Title Patient to demonstrate at least 3+/5 strength in proximal musculature and at least 4/5 strength in bilateral LEs    Time 6   Period Weeks   Status New   PT LONG TERM GOAL  #2   Title Patient will experience no more than 5/10 pain L hip with functional standing tasks and ambulation    Time 6   Status New   PT LONG TERM GOAL #3   Title Patient will be able to complete TUG in 13 seconds or less with LRAD to demonstrate improved balance and reduced fall risk    Time 6   Period Weeks   Status New   PT LONG TERM GOAL #4   Title Patient to have good knowledge of safety during mobilty in her home , and will correctly navigate (on 5/5 attempts) during simulated safety based situations, in order to reduce her overall fall risk with LRAD    Time 6   Period Weeks   Status New               Plan - 06/22/15 1522    Clinical Impression Statement Pt states she is always having a high amount of pain.  Pt only able to raise hips up 1/3 height with bridge but was able to come sit to stand with not UE at end of treatment.  Pt instructed in new strengthening exercises for hip able to demonstrate good form with verbal cuing.  Pt given prescrioption for rollator.    Clinical Impairments Affecting Rehab Potential severity of diagnosis, complications from sarcoidosis    PT Next Visit Plan begin lunging activites.         Problem List Patient Active Problem List   Diagnosis Date Noted  . Avascular necrosis of bone of left hip ([place]) 04/05/2015  . Bimalleolar fracture of right ankle 02/11/2015  . Post herpetic neuralgia 10/22/2014  . Hyperlipidemia 08/25/2014  . Obstructive sleep apnea 02/18/2014  . Obesity, unspecified 05/06/2013  . Fibromyalgia 02/11/2013  . Depression 02/11/2013  . Abdominal pain, other specified site 02/11/2013  . Shingles 02/02/2013  . Urinary retention 02/02/2013  . Pedal edema 01/22/2013  . Orthostatic hypotension 01/16/2013  . Disturbance of skin sensation 11/26/2012  . Pain in limb 11/26/2012  . Helicobacter pylori gastritis 05/23/2012  . Cough 05/02/2012  . Fibrocystic breast disease in female 04/09/2012  . DOE (dyspnea on exertion)  08/03/2011  . ANXIETY 10/19/2010  . CHEST PAIN 11/22/2008  . Sarcoidosis [facility]) 06/21/2008    Rayetta Humphrey, PT CLT [phone] 06/22/2015, 3:26 PM  [facility]  [place], [place], [address] Phone: [phone]   Fax:  [phone]  Name: Kathleen [name] MRN: [id number] Date of Birth: December 08, 1960

## 2015-06-23 DIAGNOSIS — Z029 Encounter for administrative examinations, unspecified: Secondary | ICD-10-CM

## 2015-06-24 ENCOUNTER — Ambulatory Visit (HOSPITAL_COMMUNITY): Payer: 59

## 2015-06-24 ENCOUNTER — Telehealth (HOSPITAL_COMMUNITY): Payer: Self-pay | Admitting: *Deleted

## 2015-06-24 DIAGNOSIS — R262 Difficulty in walking, not elsewhere classified: Secondary | ICD-10-CM

## 2015-06-24 DIAGNOSIS — M6281 Muscle weakness (generalized): Secondary | ICD-10-CM

## 2015-06-24 DIAGNOSIS — M87052 Idiopathic aseptic necrosis of left femur: Secondary | ICD-10-CM | POA: Diagnosis not present

## 2015-06-24 DIAGNOSIS — M1612 Unilateral primary osteoarthritis, left hip: Secondary | ICD-10-CM

## 2015-06-24 DIAGNOSIS — M25552 Pain in left hip: Secondary | ICD-10-CM

## 2015-06-24 DIAGNOSIS — Z9181 History of falling: Secondary | ICD-10-CM

## 2015-06-24 DIAGNOSIS — R2681 Unsteadiness on feet: Secondary | ICD-10-CM

## 2015-06-24 NOTE — Telephone Encounter (Signed)
phone call from [facility] regarding Xanax.   Please call to confirm directions.

## 2015-06-24 NOTE — Therapy (Signed)
[facility] [address] [place], [place], [address] Phone: [phone]   Fax:  [phone]  Physical Therapy Treatment  Patient Details  Name: Kathleen [name] MRN: [id number] Date of Birth: 09-Apr-1961 No Data Recorded  Encounter Date: 06/24/2015      PT End of Session - 06/24/15 1430    Visit Number 7   Number of Visits 12   Date for PT Re-Evaluation 06/29/15   Authorization Type MCR and UHC UMR    Authorization Time Period 06/01/15 to 08/01/15   Authorization - Visit Number 7   Authorization - Number of Visits 10   PT Start Time 1346   PT Stop Time 1430   PT Time Calculation (min) 44 min   Activity Tolerance Patient tolerated treatment well   Behavior During Therapy [facility] for tasks assessed/performed      Past Medical History  Diagnosis Date  . Sarcoidosis ([place])   . Breast fibrocystic disorder   . Mitral valve prolapse   . Breast pain     right  . Normal stress echocardiogram Jan 2011  . Normal nuclear stress test December 2012  . Normal echocardiogram December 2012    EF 55 to 60%; mild MVP, grade 1 diastolic dysfunction, PAP 32  . Fibromyalgia   . DOE (dyspnea on exertion)   . Pulmonary fibrosis ([place])   . Gastroesophageal reflux disease   . Heart disease   . Anemia     Past Surgical History  Procedure Laterality Date  . Tubal ligation    . Breast lumpectomy  01/12/2011    right    There were no vitals filed for this visit.  Visit Diagnosis:  Avascular necrosis of bone of hip, left (HCC)  Osteoarthritis of left hip, unspecified osteoarthritis type  Muscle weakness  Left hip pain  Difficulty walking  At risk for falling  Unsteadiness      Subjective Assessment - 06/24/15 1359    Subjective Pt walker into dept ambulating with no AD, stated her current walker is too short and increased back pain walking with, plans to get her rollator later today   Currently in Pain? Yes   Pain Score 8    Pain  Location Hip   Pain Orientation Left   Pain Descriptors / Indicators Aching             [facility] Adult PT Treatment/Exercise - 06/24/15 0001    Knee/Hip Exercises: Stretches   Active Hamstring Stretch Left;3 reps;30 seconds   Active Hamstring Stretch Limitations supine    Knee/Hip Exercises: Aerobic   Nustep L2 resistance 2 x 8'   Knee/Hip Exercises: Standing   Heel Raises Both;15 reps   Heel Raises Limitations Toe raises 15x   Forward Lunges Left;10 reps   Forward Lunges Limitations 6in step, unable to bear weight for Rt LE   Hip Abduction Left;15 reps   Abduction Limitations unable to bear weight for Rt LE   Hip Extension Left;15 reps   Extension Limitations unable to bear weight for Rt LE   Other Standing Knee Exercises Mini-squats 1x10 in parallel bars    Knee/Hip Exercises: Seated   Long Arc Quad Both;15 reps;Weights   Long Arc Quad Weight 4 lbs.   Knee/Hip Exercises: Supine   Heel Slides 10 reps   Heel Slides Limitations abduction slides BLE   Bridges 2 sets;10 reps   Straight Leg Raises Strengthening;Right;10 reps   Other Supine Knee/Hip Exercises clam and isometric adduction x 10  Other Supine Knee/Hip Exercises bent knee raise x 10              PT Short Term Goals - 06/01/15 1210    PT SHORT TERM GOAL #1   Title Patient will demonstrate improved energy conservation techniques as demonstrated by effective use of rollator during ambulation in her home and in community    Time 3   Period Weeks   Status New   PT SHORT TERM GOAL #2   Title Patient will demonstrate no more than 7/10 pain L hip during functional standing tasks and ambulation    Time 3   Period Weeks   Status New   PT SHORT TERM GOAL #3   Title Patient will be independent in correctly and consistently performing appropriate HEP, to be updated or modified PRN    Time 3   Period Weeks   Status New           PT Long Term Goals - 06/01/15 1213    PT LONG TERM GOAL #1   Title Patient to  demonstrate at least 3+/5 strength in proximal musculature and at least 4/5 strength in bilateral LEs    Time 6   Period Weeks   Status New   PT LONG TERM GOAL #2   Title Patient will experience no more than 5/10 pain L hip with functional standing tasks and ambulation    Time 6   Status New   PT LONG TERM GOAL #3   Title Patient will be able to complete TUG in 13 seconds or less with LRAD to demonstrate improved balance and reduced fall risk    Time 6   Period Weeks   Status New   PT LONG TERM GOAL #4   Title Patient to have good knowledge of safety during mobilty in her home , and will correctly navigate (on 5/5 attempts) during simulated safety based situations, in order to reduce her overall fall risk with LRAD    Time 6   Period Weeks   Status New               Plan - 06/24/15 1436    Clinical Impression Statement Pt continues to be limited by high levels of pain in hip, expressed new symptoms of dizziness and reports of depression due to pain.  BP and O2sat taken following reports of dizziness, BP at 113/68 mmHg and O2 at 95%.  Session focus on LE functional strengthening, added forward luges and Nustep for strengthening and activity tolerance.  Pt unable to tolerate weight bearing Lt LE with lunges, standing abduction and extension, able to complete with good form and technique with Lt LE.  Pt headed to [facility] to purchase rollator following treatment.     PT Next Visit Plan Continue with current PT POC for functional strengthening.          Problem List Patient Active Problem List   Diagnosis Date Noted  . Avascular necrosis of bone of left hip ([place]) 04/05/2015  . Bimalleolar fracture of right ankle 02/11/2015  . Post herpetic neuralgia 10/22/2014  . Hyperlipidemia 08/25/2014  . Obstructive sleep apnea 02/18/2014  . Obesity, unspecified 05/06/2013  . Fibromyalgia 02/11/2013  . Depression 02/11/2013  . Abdominal pain, other specified site 02/11/2013  .  Shingles 02/02/2013  . Urinary retention 02/02/2013  . Pedal edema 01/22/2013  . Orthostatic hypotension 01/16/2013  . Disturbance of skin sensation 11/26/2012  . Pain in limb 11/26/2012  . Helicobacter pylori gastritis  05/23/2012  . Cough 05/02/2012  . Fibrocystic breast disease in female 04/09/2012  . DOE (dyspnea on exertion) 08/03/2011  . ANXIETY 10/19/2010  . CHEST PAIN 11/22/2008  . Sarcoidosis [facility]) 06/21/2008   Ihor Austin, [place]; [place]  Aldona Lento 06/24/2015, 2:42 PM  [facility] [place], [place], [address] Phone: [phone]   Fax:  [phone]  Name: Kathleen [name] MRN: [id number] Date of Birth: November 21, 1960

## 2015-06-24 NOTE — Telephone Encounter (Signed)
Called pt pharmacy and they stated they already filled medication with the first direction which is take 0.5-1mg  TID.

## 2015-06-28 ENCOUNTER — Encounter (HOSPITAL_COMMUNITY): Payer: Self-pay

## 2015-06-29 ENCOUNTER — Encounter: Payer: Self-pay | Admitting: Family Medicine

## 2015-06-29 ENCOUNTER — Ambulatory Visit (INDEPENDENT_AMBULATORY_CARE_PROVIDER_SITE_OTHER): Payer: 59 | Admitting: Family Medicine

## 2015-06-29 VITALS — BP 118/78 | Temp 97.7°F | Ht 68.0 in | Wt 202.8 lb

## 2015-06-29 DIAGNOSIS — D869 Sarcoidosis, unspecified: Secondary | ICD-10-CM

## 2015-06-29 DIAGNOSIS — R05 Cough: Secondary | ICD-10-CM

## 2015-06-29 DIAGNOSIS — R059 Cough, unspecified: Secondary | ICD-10-CM

## 2015-06-29 DIAGNOSIS — J019 Acute sinusitis, unspecified: Secondary | ICD-10-CM | POA: Diagnosis not present

## 2015-06-29 MED ORDER — AZITHROMYCIN 250 MG PO TABS: ORAL_TABLET | ORAL | Status: DC

## 2015-06-29 NOTE — Progress Notes (Signed)
   Subjective:    Patient ID: Kathleen [name], female    DOB: Nov 08, 1960, 54 y.o.   MRN: [id number]  Cough This is a new problem. The current episode started in the past 7 days. Associated symptoms include headaches, myalgias, nasal congestion, rhinorrhea and a sore throat. Pertinent negatives include no chest pain, ear pain, fever, shortness of breath or wheezing. She has tried nothing for the symptoms.    patient has history of pulmonary fibrosis. She is under the care of specialists. She relates increased coughing denies fever or wheezing or shortness of breath currently   Review of Systems  Constitutional: Negative for fever and activity change.  HENT: Positive for congestion, rhinorrhea and sore throat. Negative for ear pain.   Eyes: Negative for discharge.  Respiratory: Positive for cough. Negative for shortness of breath and wheezing.   Cardiovascular: Negative for chest pain.  Musculoskeletal: Positive for myalgias.  Neurological: Positive for headaches.       Objective:   Physical Exam  Constitutional: She appears well-developed.  HENT:  Head: Normocephalic.  Nose: Nose normal.  Mouth/Throat: Oropharynx is clear and moist. No oropharyngeal exudate.  Neck: Neck supple.  Cardiovascular: Normal rate and normal heart sounds.   No murmur heard. Pulmonary/Chest: Effort normal and breath sounds normal. She has no wheezes.  Lymphadenopathy:    She has no cervical adenopathy.  Skin: Skin is warm and dry.  Nursing note and vitals reviewed.         Assessment & Plan:   viral illness , acute rhinosinusitis , acute bronchitis, chronic lung disease  Antibiotics prescribed warning signs discuss no need for x-rays or lab work currently

## 2015-06-30 ENCOUNTER — Ambulatory Visit (HOSPITAL_COMMUNITY): Payer: 59 | Attending: Internal Medicine

## 2015-06-30 DIAGNOSIS — Z9181 History of falling: Secondary | ICD-10-CM | POA: Insufficient documentation

## 2015-06-30 DIAGNOSIS — M1612 Unilateral primary osteoarthritis, left hip: Secondary | ICD-10-CM | POA: Insufficient documentation

## 2015-06-30 DIAGNOSIS — M6281 Muscle weakness (generalized): Secondary | ICD-10-CM | POA: Insufficient documentation

## 2015-06-30 DIAGNOSIS — M87052 Idiopathic aseptic necrosis of left femur: Secondary | ICD-10-CM | POA: Insufficient documentation

## 2015-06-30 DIAGNOSIS — M25552 Pain in left hip: Secondary | ICD-10-CM | POA: Insufficient documentation

## 2015-06-30 DIAGNOSIS — R262 Difficulty in walking, not elsewhere classified: Secondary | ICD-10-CM | POA: Insufficient documentation

## 2015-06-30 DIAGNOSIS — R2681 Unsteadiness on feet: Secondary | ICD-10-CM | POA: Insufficient documentation

## 2015-07-01 ENCOUNTER — Telehealth (HOSPITAL_COMMUNITY): Payer: Self-pay

## 2015-07-01 NOTE — Telephone Encounter (Signed)
Patient called to say she have a virus and will see [place] on 07-07-15

## 2015-07-04 ENCOUNTER — Telehealth: Payer: Self-pay | Admitting: Family Medicine

## 2015-07-04 MED ORDER — CEFPROZIL 500 MG PO TABS: 500.0000 mg | ORAL_TABLET | Freq: Two times a day (BID) | ORAL | Status: DC

## 2015-07-04 NOTE — Telephone Encounter (Signed)
patient given z pack

## 2015-07-04 NOTE — Telephone Encounter (Signed)
Pt was seen 11/2 and was issued an anti biotic She is very hoarse, cough, congestion Wants another round of anti biotic or what ever you  Think she needs, states she can't get anything up when  Coughing   [facility]

## 2015-07-04 NOTE — Telephone Encounter (Signed)
Cefzil 500 mg 1 twice a day 10 days follow-up if ongoing troubles

## 2015-07-04 NOTE — Telephone Encounter (Signed)
Rx sent electronically to pharmacy. Patient notified. 

## 2015-07-07 ENCOUNTER — Ambulatory Visit (HOSPITAL_COMMUNITY): Payer: 59

## 2015-07-07 ENCOUNTER — Telehealth (HOSPITAL_COMMUNITY): Payer: Self-pay

## 2015-07-07 NOTE — Telephone Encounter (Signed)
She is still sick and cannot come in °

## 2015-07-12 ENCOUNTER — Ambulatory Visit (HOSPITAL_COMMUNITY): Payer: 59 | Admitting: Physical Therapy

## 2015-07-12 DIAGNOSIS — M87052 Idiopathic aseptic necrosis of left femur: Secondary | ICD-10-CM | POA: Diagnosis not present

## 2015-07-12 DIAGNOSIS — M6281 Muscle weakness (generalized): Secondary | ICD-10-CM

## 2015-07-12 DIAGNOSIS — R262 Difficulty in walking, not elsewhere classified: Secondary | ICD-10-CM

## 2015-07-12 DIAGNOSIS — Z9181 History of falling: Secondary | ICD-10-CM | POA: Diagnosis present

## 2015-07-12 DIAGNOSIS — R2681 Unsteadiness on feet: Secondary | ICD-10-CM

## 2015-07-12 DIAGNOSIS — M1612 Unilateral primary osteoarthritis, left hip: Secondary | ICD-10-CM | POA: Diagnosis present

## 2015-07-12 DIAGNOSIS — M25552 Pain in left hip: Secondary | ICD-10-CM

## 2015-07-12 NOTE — Therapy (Addendum)
[facility] [place], [place], [address] Phone: [phone]   Fax:  [phone]  Physical Therapy Treatment (Re-Assessment)  Patient Details  Name: Kathleen [name] MRN: [id number] Date of Birth: 1961/05/31 No Data Recorded  Encounter Date: 07/12/2015      PT End of Session - 07/12/15 1111    Visit Number 8   Number of Visits 12   Date for PT Re-Evaluation 08/09/15   Authorization Type MCR and UHC UMR    Authorization Time Period 06/01/15 to 08/01/15   Authorization - Visit Number 8   Authorization - Number of Visits 10   PT Start Time 1016   PT Stop Time 1058   PT Time Calculation (min) 42 min   Activity Tolerance Patient limited by pain   Behavior During Therapy [facility] for tasks assessed/performed      Past Medical History  Diagnosis Date  . Sarcoidosis ([place])   . Breast fibrocystic disorder   . Mitral valve prolapse   . Breast pain     right  . Normal stress echocardiogram Jan 2011  . Normal nuclear stress test December 2012  . Normal echocardiogram December 2012    EF 55 to 60%; mild MVP, grade 1 diastolic dysfunction, PAP 32  . Fibromyalgia   . DOE (dyspnea on exertion)   . Pulmonary fibrosis ([place])   . Gastroesophageal reflux disease   . Heart disease   . Anemia     Past Surgical History  Procedure Laterality Date  . Tubal ligation    . Breast lumpectomy  01/12/2011    right    There were no vitals filed for this visit.  Visit Diagnosis:  Avascular necrosis of bone of hip, left (HCC)  Osteoarthritis of left hip, unspecified osteoarthritis type  Muscle weakness  Left hip pain  Difficulty walking  At risk for falling  Unsteadiness      Subjective Assessment - 07/12/15 1020    Subjective Patient arrives today without assitive device, reports taht her current walker has broken and she unable to get Rollator due to copay that she has been unable to afford but may get it today Has been walking  her cane, crutches, or no device.    Pertinent History Patient was first diagnosed with AVN of L hip around 6-8 months ago; she did not know what was going on, just thought she had a sore hip. Cannot have a hip replacement due to medicine she is on for her lungs, she is not able to handle the anesthetic. Cannot stand pain in sitting or laying- really cannot find a comfortable position. She has tried to do things at home on her own but cannot tell a difference in it yet.    How long can you sit comfortably? 11/15- immediate discomfort    How long can you stand comfortably? 11/15- immediate discomfort    How long can you walk comfortably? 11/15- immediate discomfort    Currently in Pain? Yes   Pain Score 8    Pain Location Hip   Pain Orientation Left            [facility] PT Assessment - 07/12/15 0001    Strength   Right Hip Flexion 3-/5   Left Hip Flexion 2+/5   Left Hip ABduction 2-/5   Right Knee Flexion 3+/5   Right Knee Extension 4-/5   Left Knee Flexion 3/5   Left Knee Extension 3/5   Right Ankle Dorsiflexion 3+/5  Left Ankle Dorsiflexion 3/5                     [facility] Adult PT Treatment/Exercise - 07/12/15 0001    Knee/Hip Exercises: Stretches   Active Hamstring Stretch Both;3 reps;30 seconds   Active Hamstring Stretch Limitations supine by PT    Gastroc Stretch 3 reps;30 seconds   Gastroc Stretch Limitations slantboard    Manual Therapy   Manual Therapy Soft tissue mobilization   Myofascial Release Lt hip and ITB gentle                PT Education - 07/12/15 1111    Education provided Yes   Education Details prognosis considering her diagnosis, one more session to make sure she is safe with rollator and adjust HEP, general information regarding AVN; encouraged to see massage tehrapist regarding ITB/TFL tightness and pain    Person(s) Educated Patient   Methods Explanation   Comprehension Verbalized understanding          PT Short Term Goals -  07/12/15 1037    PT SHORT TERM GOAL #1   Title Patient will demonstrate improved energy conservation techniques as demonstrated by effective use of rollator during ambulation in her home and in community    Baseline 11/14- patient reports that hopefully she will be able to get rollator later today    Time 3   Period Weeks   Status On-going   PT SHORT TERM GOAL #2   Title Patient will demonstrate no more than 7/10 pain L hip during functional standing tasks and ambulation    Baseline 11/14- continues to stay at least an 8/10   Time 3   Period Weeks   Status On-going   PT SHORT TERM GOAL #3   Title Patient will be independent in correctly and consistently performing appropriate HEP, to be updated or modified PRN    Baseline 11/14- has not been able to do anything for the last 2 weeks, had virus; however reports that before she got sick HEP was not really helping a lot    Time 3   Period Weeks   Status On-going           PT Long Term Goals - 07/12/15 1039    PT LONG TERM GOAL #1   Title Patient to demonstrate at least 3+/5 strength in proximal musculature and at least 4/5 strength in bilateral LEs    Time 6   Period Weeks   Status On-going   PT LONG TERM GOAL #2   Title Patient will experience no more than 5/10 pain L hip with functional standing tasks and ambulation    Time 6   Period Weeks   Status On-going   PT LONG TERM GOAL #3   Title Patient will be able to complete TUG in 13 seconds or less with LRAD to demonstrate improved balance and reduced fall risk    Time 6   Period Weeks   Status On-going   PT LONG TERM GOAL #4   Title Patient to have good knowledge of safety during mobilty in her home , and will correctly navigate (on 5/5 attempts) during simulated safety based situations, in order to reduce her overall fall risk with LRAD    Baseline 11/14- patient reports that she feels like she does ahve good safety awareness however this has been a concern in the past due to  almost falling   Time 6   Period Weeks   Status  On-going               Plan - 2015-08-06 1112    Clinical Impression Statement Re-assessment performed today. Patient has been sick for last two weeks and does not show significant improvement in pain, strength, gait pattern, or functional mobility; she reports that her HEP has not been feeling like it is helping and that she has not noticed any major chaanges since she has started PT, has even noticed increased knotting and tightening of L ITB. At this piont patient is not benefitting from skilled PT services, and it is posible that it may even be exaerbating her condition and pain. She will recieve one more skilled PT session  focusing on safety with new rollator, as she is planning on getting this this afternoon, and appropriate HEP.    Pt will benefit from skilled therapeutic intervention in order to improve on the following deficits Abnormal gait;Decreased endurance;Decreased activity tolerance;Decreased strength;Pain;Decreased balance;Difficulty walking;Improper body mechanics;Decreased coordination;Impaired flexibility;Postural dysfunction   Rehab Potential Fair   Clinical Impairments Affecting Rehab Potential severity of diagnosis, complications from sarcoidosis    PT Frequency Other (comment)  1 more session    PT Duration Other (comment)  1 more session    PT Treatment/Interventions ADLs/Self Care Home Management;DME Instruction;Gait training;Stair training;Functional mobility training;Therapeutic activities;Therapeutic exercise;Balance training;Neuromuscular re-education;Patient/family education;Passive range of motion;Energy conservation   PT Next Visit Plan one more session to ensure safety with rollator, give appropriate HEP. DC.    PT Home Exercise Plan given    Consulted and Agree with Plan of Care Patient          G-Codes - 08/06/2015 1200    Functional Assessment Tool Used Based on skilled clinical assessment of strength,  gait, pain, functional mobility, balance    Functional Limitation Mobility: Walking and moving around   Mobility: Walking and Moving Around Current Status ([id number]) At least 60 percent but less than 80 percent impaired, limited or restricted   Mobility: Walking and Moving Around Goal Status [phone]) At least 40 percent but less than 60 percent impaired, limited or restricted      Problem List Patient Active Problem List   Diagnosis Date Noted  . Avascular necrosis of bone of left hip ([place]) 04/05/2015  . Bimalleolar fracture of right ankle 02/11/2015  . Post herpetic neuralgia 10/22/2014  . Hyperlipidemia 08/25/2014  . Obstructive sleep apnea 02/18/2014  . Obesity, unspecified 05/06/2013  . Fibromyalgia 02/11/2013  . Depression 02/11/2013  . Abdominal pain, other specified site 02/11/2013  . Shingles 02/02/2013  . Urinary retention 02/02/2013  . Pedal edema 01/22/2013  . Orthostatic hypotension 01/16/2013  . Disturbance of skin sensation 11/26/2012  . Pain in limb 11/26/2012  . Helicobacter pylori gastritis 05/23/2012  . Cough 05/02/2012  . Fibrocystic breast disease in female 04/09/2012  . DOE (dyspnea on exertion) 08/03/2011  . ANXIETY 10/19/2010  . CHEST PAIN 11/22/2008  . Sarcoidosis ([place]) 06/21/2008    Physical Therapy Progress Note  Dates of Reporting Period: 06/01/15 to 07/11/15  Objective Reports of Subjective Statement: see above   Objective Measurements: see above   Goal Update: see above   Plan: see above   Reason Skilled Services are Required: ensure safety with rollator if patient has it, appropriate HEP, [place], DPT [place], [place], [address] Phone: [phone]   Fax:  [phone]  Name: Kathleen [name] MRN: [id number]  Date of Birth: 1961-01-29  PHYSICAL THERAPY DISCHARGE SUMMARY  Visits from Start of Care: 8  Current functional level  related to goals / functional outcomes: See above    Remaining deficits: See above   Education / Equipment: HEP  Plan: Patient agrees to discharge.  Patient goals were partially met. Patient is being discharged due to the patient's request.  ?????       Rayetta Humphrey, [place] CLT [phone]

## 2015-07-14 ENCOUNTER — Ambulatory Visit (INDEPENDENT_AMBULATORY_CARE_PROVIDER_SITE_OTHER): Payer: 59 | Admitting: Psychiatry

## 2015-07-14 ENCOUNTER — Encounter (HOSPITAL_COMMUNITY): Payer: Self-pay | Admitting: Psychiatry

## 2015-07-14 ENCOUNTER — Ambulatory Visit (HOSPITAL_COMMUNITY): Payer: 59

## 2015-07-14 VITALS — BP 111/60 | HR 81 | Ht 68.0 in | Wt 205.4 lb

## 2015-07-14 DIAGNOSIS — F329 Major depressive disorder, single episode, unspecified: Secondary | ICD-10-CM | POA: Diagnosis not present

## 2015-07-14 DIAGNOSIS — F32A Depression, unspecified: Secondary | ICD-10-CM

## 2015-07-14 DIAGNOSIS — F411 Generalized anxiety disorder: Secondary | ICD-10-CM

## 2015-07-14 MED ORDER — BUPROPION HCL ER (XL) 300 MG PO TB24
300.0000 mg | ORAL_TABLET | ORAL | Status: DC
Start: 2015-07-14 — End: 2015-11-14

## 2015-07-14 MED ORDER — ALPRAZOLAM 1 MG PO TABS: 1.0000 mg | ORAL_TABLET | Freq: Three times a day (TID) | ORAL | Status: DC
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
## 2015-07-18 ENCOUNTER — Ambulatory Visit (HOSPITAL_COMMUNITY): Payer: 59 | Admitting: Physical Therapy

## 2015-07-20 ENCOUNTER — Telehealth (HOSPITAL_COMMUNITY): Payer: Self-pay | Admitting: Physical Therapy

## 2015-07-20 ENCOUNTER — Encounter (HOSPITAL_COMMUNITY): Payer: Self-pay | Admitting: Physical Therapy

## 2015-07-20 NOTE — Telephone Encounter (Signed)
Returned patient's phone call; explained that PT at this time is recommending DC as at last re-assessment she did not show any functional improvements and was continuing to experience very high levels of pain, also educated patient regarding concern that continued PT might actually be exacerbating her pain and about the progressive nature of her condition. Patient gave verbal understanding and reported that she is going to speak to MD about what she needs to do moving forward.  Deniece Ree PT, DPT [phone]

## 2015-07-26 ENCOUNTER — Encounter (HOSPITAL_COMMUNITY): Payer: Self-pay

## 2015-07-28 ENCOUNTER — Encounter (HOSPITAL_COMMUNITY): Payer: Self-pay | Admitting: Physical Therapy

## 2015-08-16 ENCOUNTER — Ambulatory Visit (INDEPENDENT_AMBULATORY_CARE_PROVIDER_SITE_OTHER): Payer: 59 | Admitting: Family Medicine

## 2015-08-16 ENCOUNTER — Encounter: Payer: Self-pay | Admitting: Family Medicine

## 2015-08-16 VITALS — BP 130/76 | Ht 68.0 in | Wt 201.1 lb

## 2015-08-16 DIAGNOSIS — E785 Hyperlipidemia, unspecified: Secondary | ICD-10-CM | POA: Diagnosis not present

## 2015-08-16 DIAGNOSIS — N3000 Acute cystitis without hematuria: Secondary | ICD-10-CM

## 2015-08-16 DIAGNOSIS — G894 Chronic pain syndrome: Secondary | ICD-10-CM | POA: Diagnosis not present

## 2015-08-16 DIAGNOSIS — M87052 Idiopathic aseptic necrosis of left femur: Secondary | ICD-10-CM | POA: Diagnosis not present

## 2015-08-16 LAB — POCT URINALYSIS DIPSTICK
Bilirubin, UA: POSITIVE
pH, UA: 5

## 2015-08-16 MED ORDER — OXYCODONE HCL 5 MG PO TABA: 5.0000 mg | ORAL_TABLET | Freq: Four times a day (QID) | ORAL | Status: DC | PRN

## 2015-08-16 MED ORDER — SULFAMETHOXAZOLE-TRIMETHOPRIM 800-160 MG PO TABS: ORAL_TABLET | ORAL | Status: DC

## 2015-08-16 NOTE — Patient Instructions (Addendum)
Pain meds one every 4 to 6 hours as needed  If causing drowsiness stop and call  Do not take the pain med and Xanax together at bedtime due to risk of accidental death   As part of your visit today we have covered your chronic pain. You have been given prescription(s) for pain medicines.The DEA and [place] require that any patient on pain medications must be seen every 3 months. You are expected to come in for a office visit before further pain medications are issued.  Since we are managing your pain do not get pain scripts from other doctors. We check the prescription registry regularly. If you are receiving pain medicines from another source we will STOP prescribing pain medicines.   We will not refill medications or early nor will we give an extended month supply at the end of these prescriptions.It is your responsibility to keep up with medications. They will not be replaced.  It is your responsibility to schedule an office visit in 3 months to be seen before you are out of your medication. Do not call our office to request early refills or additional refills. Do not wait till the last moment to schedule the follow up visit. We highly recommend you schedule this now for 3 months.  We believe that most patients take their meds as prescribed but drug misuse and diversion is a serious problem in the [place]. Our office does standard measures to insure proper care to all. All patients are subject to random urine drug screens/ saliva tests and random pill counts. Also all patients drug prescription records are reviewed on a regular basis in accordance with [facility] medical board policies.  Remember, do not use alcohol or illegal drugs with your pain medications. If you are feeling drowsy or affected by your medicine you are not to operate any machinery , do any dangerous activities or drive while this is occurring.   We are required by law to adhere to strict regulations. Failure on our part to  follow these regulations could jeopardize our prescription license which in turn would cause [place] not to be able to care for you.Thank you for your understanding and following these policies.

## 2015-08-16 NOTE — Progress Notes (Signed)
   Subjective:    Patient ID: Kathleen [name], female    DOB: 10-27-1960, 54 y.o.   MRN: [id number]  Hyperlipidemia This is a chronic problem. The current episode started more than 1 year ago. There are no compliance problems.    Patient states no other concerns this visit.  patient comes in today for follow-up. She has chronic pain and discomfort in her left hip and lower back she takes medication takes the edge off pain she denies abusing it recently she had take the medicine 3-4 times per day because of increased pain she did see the back specialist who told her that she ought to use of plain oxycodone to avoid acetaminophen to spare her liver   Patient also having depression she is seen counselor regular basis this seems to help as well as medication may have her on which also seems to help   She relates reflux under decent control with her medication she relates she is continuing to take this on a regular basis She is also having dysuria urinary frequency some symptoms of UTI  Review of Systems  Constitutional: Negative for activity change and appetite change.  Gastrointestinal: Negative for vomiting and abdominal pain.  Neurological: Negative for weakness.  Psychiatric/Behavioral: Negative for confusion.       Objective:   Physical Exam  Constitutional: She appears well-nourished. No distress.  HENT:  Head: Normocephalic.  Cardiovascular: Normal rate, regular rhythm and normal heart sounds.   No murmur heard. Pulmonary/Chest: Effort normal and breath sounds normal.  Musculoskeletal: She exhibits no edema.  Lymphadenopathy:    She has no cervical adenopathy.  Neurological: She is alert.  Psychiatric: Her behavior is normal.  Vitals reviewed.   25 minutes was spent with the patient. Greater than half the time was spent in discussion and answering questions and counseling regarding the issues that the patient came in for today.       Assessment & Plan:  The patient was  seen today as part of a comprehensive visit regarding pain control. Patient's compliance with the medication as well as discussion regarding effectiveness was completed. Prescriptions were written. Patient was advised to follow-up in 3 months. The patient was assessed for any signs of severe side effects. The patient was advised to take the medicine as directed and to report to [place] if any side effect issues.  1. Avascular necrosis of bone of left hip (HCC)  unfortunately there is nothing that can be done for this other than pain control the specialist does not want to do surgery because her frayed of infection or DVT obviously gets worse as she gets older she may need surgery patient sad about this  2. Hyperlipemia  hyperlipidemia the importance of watching diet discussed  3. Chronic pain syndrome  patient with chronic pain in her left hip and lower back pain medication maybe use as stated above up to 4 times per day not to take this at bedtime not to take this with her nerve pill discussion held with the patient how taking both together can accidentally increased risk of death  4. Acute cystitis without hematuria  antibiotics prescribed. - Urine Culture - POCT urinalysis dipstick   patient with depression being monitored by mental health.

## 2015-08-18 LAB — URINE CULTURE

## 2015-08-26 ENCOUNTER — Other Ambulatory Visit: Payer: Self-pay

## 2015-08-26 MED ORDER — OXYCODONE HCL 5 MG PO TABS: 5.0000 mg | ORAL_TABLET | Freq: Four times a day (QID) | ORAL | Status: DC | PRN

## 2015-09-06 ENCOUNTER — Ambulatory Visit (HOSPITAL_COMMUNITY): Payer: Self-pay | Admitting: Psychology

## 2015-09-12 MED FILL — predniSONE 5 MG TABS: 5 | 90 days supply | Qty: 90 | Fill #0

## 2015-09-13 ENCOUNTER — Ambulatory Visit (HOSPITAL_COMMUNITY): Payer: Self-pay | Admitting: Psychiatry

## 2015-09-22 ENCOUNTER — Encounter (HOSPITAL_COMMUNITY): Payer: Self-pay | Admitting: Psychology

## 2015-09-22 ENCOUNTER — Ambulatory Visit (HOSPITAL_COMMUNITY): Payer: Self-pay | Admitting: Psychology

## 2015-09-29 MED FILL — ALPRAZolam 1 MG TABS: 1 | 30 days supply | Qty: 90 | Fill #0

## 2015-09-29 MED FILL — oxyCODONE HCL 5 MG TABS: 5 | 30 days supply | Qty: 120 | Fill #0

## 2015-10-20 ENCOUNTER — Encounter (HOSPITAL_COMMUNITY): Payer: Self-pay | Admitting: Emergency Medicine

## 2015-10-20 ENCOUNTER — Emergency Department (HOSPITAL_COMMUNITY)
Admission: EM | Admit: 2015-10-20 | Discharge: 2015-10-21 | Disposition: A | Discharge status: Home / Self Care | Payer: 59 | Attending: Emergency Medicine | Admitting: Emergency Medicine

## 2015-10-20 ENCOUNTER — Emergency Department (HOSPITAL_COMMUNITY): Payer: 59

## 2015-10-20 DIAGNOSIS — D649 Anemia, unspecified: Secondary | ICD-10-CM | POA: Insufficient documentation

## 2015-10-20 DIAGNOSIS — Z8709 Personal history of other diseases of the respiratory system: Secondary | ICD-10-CM | POA: Diagnosis not present

## 2015-10-20 DIAGNOSIS — R062 Wheezing: Secondary | ICD-10-CM | POA: Diagnosis not present

## 2015-10-20 DIAGNOSIS — Z8742 Personal history of other diseases of the female genital tract: Secondary | ICD-10-CM | POA: Diagnosis not present

## 2015-10-20 DIAGNOSIS — R0602 Shortness of breath: Secondary | ICD-10-CM | POA: Insufficient documentation

## 2015-10-20 DIAGNOSIS — Z7952 Long term (current) use of systemic steroids: Secondary | ICD-10-CM | POA: Diagnosis not present

## 2015-10-20 DIAGNOSIS — Z79899 Other long term (current) drug therapy: Secondary | ICD-10-CM | POA: Insufficient documentation

## 2015-10-20 DIAGNOSIS — R079 Chest pain, unspecified: Secondary | ICD-10-CM | POA: Insufficient documentation

## 2015-10-20 DIAGNOSIS — R51 Headache: Secondary | ICD-10-CM | POA: Insufficient documentation

## 2015-10-20 DIAGNOSIS — R42 Dizziness and giddiness: Secondary | ICD-10-CM | POA: Diagnosis not present

## 2015-10-20 DIAGNOSIS — R05 Cough: Secondary | ICD-10-CM | POA: Insufficient documentation

## 2015-10-20 DIAGNOSIS — Z8679 Personal history of other diseases of the circulatory system: Secondary | ICD-10-CM | POA: Insufficient documentation

## 2015-10-20 DIAGNOSIS — M797 Fibromyalgia: Secondary | ICD-10-CM | POA: Diagnosis not present

## 2015-10-20 DIAGNOSIS — K219 Gastro-esophageal reflux disease without esophagitis: Secondary | ICD-10-CM | POA: Insufficient documentation

## 2015-10-20 LAB — CBC WITH DIFFERENTIAL/PLATELET
Basophils Absolute: 0 10*3/uL (ref 0.0–0.1)
Basophils Relative: 0 %
Eosinophils Absolute: 0.2 10*3/uL (ref 0.0–0.7)
Eosinophils Relative: 3 %
HCT: 36.7 % (ref 36.0–46.0)
Hemoglobin: 12.4 g/dL (ref 12.0–15.0)
Lymphocytes Relative: 17 %
Lymphs Abs: 1.1 10*3/uL (ref 0.7–4.0)
MCH: 29.6 pg (ref 26.0–34.0)
MCHC: 33.8 g/dL (ref 30.0–36.0)
MCV: 87.6 fL (ref 78.0–100.0)
Monocytes Absolute: 0.4 10*3/uL (ref 0.1–1.0)
Monocytes Relative: 7 %
Neutro Abs: 4.9 10*3/uL (ref 1.7–7.7)
Neutrophils Relative %: 73 %
Platelets: 325 10*3/uL (ref 150–400)
RBC: 4.19 MIL/uL (ref 3.87–5.11)
RDW: 14.6 % (ref 11.5–15.5)
WBC: 6.7 10*3/uL (ref 4.0–10.5)

## 2015-10-20 LAB — BASIC METABOLIC PANEL
Anion gap: 8 (ref 5–15)
BUN: 14 mg/dL (ref 6–20)
CO2: 27 mmol/L (ref 22–32)
Calcium: 9.2 mg/dL (ref 8.9–10.3)
Chloride: 105 mmol/L (ref 101–111)
Creatinine, Ser: 0.93 mg/dL (ref 0.44–1.00)
GFR calc Af Amer: >60 mL/min (ref >60)
GFR calc non Af Amer: >60 mL/min (ref >60)
Glucose, Bld: 100 mg/dL — ABNORMAL HIGH (ref 65–99)
Potassium: 4 mmol/L (ref 3.5–5.1)
Sodium: 140 mmol/L (ref 135–145)

## 2015-10-20 LAB — TROPONIN I: Troponin I: <0.03 ng/mL (ref <0.031)

## 2015-10-20 MED ORDER — DEXAMETHASONE SODIUM PHOSPHATE 4 MG/ML IJ SOLN
10.0000 mg | Freq: Once | INTRAMUSCULAR | Status: AC
  Administered 2015-10-20: 10 mg via INTRAMUSCULAR
  Filled 2015-10-20: qty 3

## 2015-10-20 MED ORDER — ACETAMINOPHEN 500 MG PO TABS
1000.0000 mg | ORAL_TABLET | Freq: Once | ORAL | Status: AC
  Administered 2015-10-20: 1000 mg via ORAL
  Filled 2015-10-20: qty 2

## 2015-10-20 MED ORDER — IPRATROPIUM-ALBUTEROL 0.5-2.5 (3) MG/3ML IN SOLN
3.0000 mL | Freq: Once | RESPIRATORY_TRACT | Status: AC
  Administered 2015-10-21: 3 mL via RESPIRATORY_TRACT
  Filled 2015-10-20: qty 3

## 2015-10-20 MED ORDER — ALBUTEROL SULFATE (2.5 MG/3ML) 0.083% IN NEBU
5.0000 mg | INHALATION_SOLUTION | Freq: Once | RESPIRATORY_TRACT | Status: AC
  Administered 2015-10-20: 5 mg via RESPIRATORY_TRACT
  Filled 2015-10-20: qty 6

## 2015-10-20 NOTE — ED Notes (Signed)
Patient complaining of shortness of breath starting today. Also states "my chest has been hurting today but it hurts a lot off and on."

## 2015-10-20 NOTE — ED Notes (Signed)
No distress noted at present time.

## 2015-10-20 NOTE — ED Provider Notes (Signed)
CSN: [id number]     Arrival date & time 10/20/15  2050 History  By signing my name below, I, Dora Sims, attest that this documentation has been prepared under the direction and in the presence of Merryl Hacker, MD . Electronically Signed: Dora Sims, Scribe. 10/20/2015. 11:06 PM.    Chief Complaint  Patient presents with  . Shortness of Breath  . Chest Pain    Patient is a 55 y.o. female presenting with chest pain. The history is provided by the patient. No language interpreter was used.  Chest Pain Associated symptoms: cough, headache and shortness of breath   Associated symptoms: no fever, no numbness, not vomiting and no weakness      HPI Comments: Kathleen [name] is a 55 y.o. female with h/o pulmonary fibrosis who presents to the Emergency Department complaining of sudden onset, constant, 3/10, mid-chest pain beginning earlier today. She states that she went to the grocery store this morning and suddenly experienced chest pain. She states that her chest pain radiates throughout her chest; she describes the pain as a "pushing pressure." She endorses mild pain exacerbation with palpation to middle chest. Pt reports that she has laid on the couch since onset of CP and endorses that she also felt lightheaded. She notes that she occasionally experiences CP due to pulmonary fibrosis; she takes Prednisone and Imuran. Pt reports that she took a breathing treatment at the hospital earlier tonight with mild relief. She states that she has been coughing constantly and feeling SOB onset of CP. Pt notes an associated headache. Pt also reports a new right hip issue as a side effect of her Imuran medication. Pt denies smoking cigarettes or drinking alcohol. She has no h/o HTN or HLD. She denies numbness, weakness, or any other associated symptoms at this time.    Past Medical History  Diagnosis Date  . Sarcoidosis ([place])   . Breast fibrocystic disorder   . Mitral valve prolapse   . Breast  pain     right  . Normal stress echocardiogram Jan 2011  . Normal nuclear stress test December 2012  . Normal echocardiogram December 2012    EF 55 to 60%; mild MVP, grade 1 diastolic dysfunction, PAP 32  . Fibromyalgia   . DOE (dyspnea on exertion)   . Pulmonary fibrosis ([place])   . Gastroesophageal reflux disease   . Heart disease   . Anemia    Past Surgical History  Procedure Laterality Date  . Tubal ligation    . Breast lumpectomy  01/12/2011    right   Family History  Problem Relation Age of Onset  . Cardiomyopathy Mother   . Breast cancer Mother   . Heart disease Maternal Aunt   . Prostate cancer Father   . Colon cancer Neg Hx   . Neurofibromatosis Brother   . Bipolar disorder Daughter    Social History  Substance Use Topics  . Smoking status: Never Smoker   . Smokeless tobacco: Never Used  . Alcohol Use: No   OB History    No data available     Review of Systems  Constitutional: Negative for fever.  Respiratory: Positive for cough and shortness of breath.   Cardiovascular: Positive for chest pain.  Gastrointestinal: Negative for vomiting.  Neurological: Positive for light-headedness and headaches. Negative for weakness and numbness.  All other systems reviewed and are negative.     Allergies  Bee venom; Benadryl; Codeine; Diphenhydramine; and Morphine and related  Home Medications  Prior to Admission medications   Medication Sig Start Date End Date Taking? Authorizing Provider  albuterol (PROVENTIL) (2.5 MG/3ML) 0.083% nebulizer solution Take 2.5 mg by nebulization every 6 (six) hours as needed for wheezing or shortness of breath.   Yes Historical Provider, MD  ALPRAZolam Duanne Moron) 1 MG tablet Take 1 tablet (1 mg total) by mouth 3 (three) times daily. 07/14/15  Yes Cloria Spring, MD  azaTHIOprine (IMURAN) 50 MG tablet Take 100 mg by mouth every morning.    Yes Historical Provider, MD  budesonide (PULMICORT) 0.5 MG/2ML nebulizer solution Take 0.5 mg by  nebulization daily as needed (for shortness of breath).  05/23/15 05/22/16 Yes Historical Provider, MD  ferrous sulfate 325 (65 FE) MG EC tablet Take 325 mg by mouth daily.   Yes Historical Provider, MD  oxyCODONE (OXY IR/ROXICODONE) 5 MG immediate release tablet Take 1 tablet (5 mg total) by mouth 4 (four) times daily as needed for severe pain. 08/26/15  Yes [name], MD  pantoprazole (PROTONIX) 40 MG tablet Take 40 mg by mouth every morning.   Yes Historical Provider, MD  predniSONE (DELTASONE) 5 MG tablet Take 5 mg by mouth daily with breakfast.  09/12/15  Yes Historical Provider, MD  buPROPion (WELLBUTRIN XL) 300 MG 24 hr tablet Take 1 tablet (300 mg total) by mouth every morning. Patient not taking: Reported on 10/20/2015 07/14/15 07/13/16  Cloria Spring, MD  EPINEPHrine 0.3 mg/0.3 mL IJ SOAJ injection Inject 0.3 mLs (0.3 mg total) into the muscle once. 02/18/14   [name], MD   BP 115/70 mmHg  Pulse 72  Temp(Src) 97.3 F (36.3 C) (Temporal)  Resp 21  Ht 5\' 10"  (1.778 m)  Wt 200 lb (90.719 kg)  BMI 28.70 kg/m2  SpO2 97%  LMP 09/28/2015 Physical Exam  Constitutional: She is oriented to person, place, and time. She appears well-developed and well-nourished.  HENT:  Head: Normocephalic and atraumatic.  Eyes: Pupils are equal, round, and reactive to light.  Cardiovascular: Normal rate, regular rhythm and normal heart sounds.   Pulmonary/Chest: Effort normal. No respiratory distress. She has wheezes. She exhibits tenderness.  Scant wheeze  Abdominal: Soft. Bowel sounds are normal. There is no tenderness. There is no rebound.  Musculoskeletal: She exhibits no edema.  Neurological: She is alert and oriented to person, place, and time.  Skin: Skin is warm and dry.  Psychiatric: She has a normal mood and affect.  Nursing note and vitals reviewed.   ED Course  Procedures (including critical care time)  DIAGNOSTIC STUDIES: Oxygen Saturation is 95% on RA, adequate by my  interpretation.    COORDINATION OF CARE: 11:07 PM Will order blood work. Will administer nebulizer, Decadron 10 mg, Tylenol 1,000 mg. Discussed treatment plan with pt at bedside and pt agreed to plan.   Labs Review Labs Reviewed  BASIC METABOLIC PANEL - Abnormal; Notable for the following:    Glucose, Bld 100 (*)    All other components within normal limits  CBC WITH DIFFERENTIAL/PLATELET  TROPONIN I    Imaging Review Dg Chest 2 View  10/20/2015  CLINICAL DATA:  Pt c/o left sided chest pain, SOB and cough, onset this morning. Sarcoidosis, pulmonary fibrosis, non smoker. Hx of right sided breat lumpectomy. EXAM: CHEST  2 VIEW COMPARISON:  03/26/2014 FINDINGS: Cardiac silhouette is normal in size and configuration. No convincing mediastinal or hilar masses. Lungs show irregular interstitial thickening consistent with sarcoid interstitial lung disease, similar to the prior exam. No evidence  of pneumonia or pulmonary edema. No pleural effusion or pneumothorax. Skeletal structures are unremarkable. IMPRESSION: 1. No acute cardiopulmonary disease. 2. Chronic lung changes consistent with sarcoidosis. Electronically Signed   By: Lajean Manes M.D.   On: 10/20/2015 21:15   I have personally reviewed and evaluated these lab results as part of my medical decision-making.   EKG Interpretation   Date/Time:  Thursday October 20 2015 20:55:20 EST Ventricular Rate:  68 PR Interval:  136 QRS Duration: 70 QT Interval:  404 QTC Calculation: 429 R Axis:   46 Text Interpretation:  Normal sinus rhythm with sinus arrhythmia Normal ECG  Confirmed by HORTON  MD, COURTNEY ([id number]) on 10/20/2015 10:55:25 PM      MDM   Final diagnoses:  Shortness of breath  Chest pain, unspecified chest pain type  Hx of pulmonary fibrosis    Patient presents with chest pain and shortness of breath. History of pulmonary fibrosis. No acute distress. Reproducible chest pain on exam with bilateral wheezing. Improved with  nebulization. Patient was given IV Decadron and a second duo neb. Lab work was obtained. EKG is nonischemic and troponin is negative. Troponin is a approximately 12 hours after onset of pain. Doubt ACS.  Given history of pulmonary fibrosis and improvement with DuoNeb, suspect this is the cause or contributing to the patient's chest pain. No PE risk factors. Age is the only factor that makes her PERC positive.    On recheck, patient reports improvement of symptoms. States that she feels jittery. She was able to angulate and maintain pulse ox greater than 99%. Will discharge home and have her follow up closely with her primary physician.  After history, exam, and medical workup I feel the patient has been appropriately medically screened and is safe for discharge home. Pertinent diagnoses were discussed with the patient. Patient was given return precautions.  I personally performed the services described in this documentation, which was scribed in my presence. The recorded information has been reviewed and is accurate.    Merryl Hacker, MD 10/21/15 [place]

## 2015-10-21 ENCOUNTER — Other Ambulatory Visit (HOSPITAL_COMMUNITY)
Admission: RE | Admit: 2015-10-21 | Discharge: 2015-10-21 | Disposition: A | Discharge status: Home / Self Care | Payer: 59 | Source: Ambulatory Visit | Attending: Family Medicine | Admitting: Family Medicine

## 2015-10-21 ENCOUNTER — Telehealth: Payer: Self-pay | Admitting: *Deleted

## 2015-10-21 ENCOUNTER — Ambulatory Visit (INDEPENDENT_AMBULATORY_CARE_PROVIDER_SITE_OTHER): Payer: 59 | Admitting: Family Medicine

## 2015-10-21 ENCOUNTER — Encounter: Payer: Self-pay | Admitting: Family Medicine

## 2015-10-21 VITALS — BP 132/76 | Temp 98.4°F | Ht 68.0 in | Wt 205.0 lb

## 2015-10-21 DIAGNOSIS — R0600 Dyspnea, unspecified: Secondary | ICD-10-CM | POA: Diagnosis not present

## 2015-10-21 DIAGNOSIS — R51 Headache: Secondary | ICD-10-CM | POA: Diagnosis not present

## 2015-10-21 DIAGNOSIS — R05 Cough: Secondary | ICD-10-CM | POA: Diagnosis not present

## 2015-10-21 DIAGNOSIS — K219 Gastro-esophageal reflux disease without esophagitis: Secondary | ICD-10-CM | POA: Diagnosis not present

## 2015-10-21 DIAGNOSIS — R1012 Left upper quadrant pain: Secondary | ICD-10-CM

## 2015-10-21 DIAGNOSIS — D649 Anemia, unspecified: Secondary | ICD-10-CM | POA: Diagnosis not present

## 2015-10-21 DIAGNOSIS — R079 Chest pain, unspecified: Secondary | ICD-10-CM | POA: Diagnosis not present

## 2015-10-21 DIAGNOSIS — R42 Dizziness and giddiness: Secondary | ICD-10-CM | POA: Diagnosis not present

## 2015-10-21 DIAGNOSIS — R062 Wheezing: Secondary | ICD-10-CM | POA: Diagnosis not present

## 2015-10-21 DIAGNOSIS — M797 Fibromyalgia: Secondary | ICD-10-CM | POA: Diagnosis not present

## 2015-10-21 DIAGNOSIS — R0602 Shortness of breath: Secondary | ICD-10-CM | POA: Diagnosis not present

## 2015-10-21 LAB — D-DIMER, QUANTITATIVE (NOT AT ARMC): D-Dimer, Quant: <0.27 ug/mL-FEU (ref 0.00–0.50)

## 2015-10-21 MED FILL — BUDESONIDE 0.5 MG/2 ML SUSP: 0.5 | 30 days supply | Qty: 60 | Fill #0

## 2015-10-21 NOTE — Telephone Encounter (Signed)
Called pt. D dimer normal per dr Nicki Reaper. Pt needs to do hemoccult cards per dr Nicki Reaper. Hemoccult cards mailed for pt to do and instructed on how to do them. Pt verbalized understanding.

## 2015-10-21 NOTE — Discharge Instructions (Signed)
You were seen today for chest pain. Your workup is reassuring. This may be related to your known pulmonary fibrosis. He need follow-up with her primary physician and cardiology. If you have any new or worsening symptoms she should be reevaluated immediately.  Nonspecific Chest Pain  Chest pain can be caused by many different conditions. There is always a chance that your pain could be related to something serious, such as a heart attack or a blood clot in your lungs. Chest pain can also be caused by conditions that are not life-threatening. If you have chest pain, it is very important to follow up with your health care provider. CAUSES  Chest pain can be caused by:  Heartburn.  Pneumonia or bronchitis.  Anxiety or stress.  Inflammation around your heart (pericarditis) or lung (pleuritis or pleurisy).  A blood clot in your lung.  A collapsed lung (pneumothorax). It can develop suddenly on its own (spontaneous pneumothorax) or from trauma to the chest.  Shingles infection (varicella-zoster virus).  Heart attack.  Damage to the bones, muscles, and cartilage that make up your chest wall. This can include:  Bruised bones due to injury.  Strained muscles or cartilage due to frequent or repeated coughing or overwork.  Fracture to one or more ribs.  Sore cartilage due to inflammation (costochondritis). RISK FACTORS  Risk factors for chest pain may include:  Activities that increase your risk for trauma or injury to your chest.  Respiratory infections or conditions that cause frequent coughing.  Medical conditions or overeating that can cause heartburn.  Heart disease or family history of heart disease.  Conditions or health behaviors that increase your risk of developing a blood clot.  Having had chicken pox (varicella zoster). SIGNS AND SYMPTOMS Chest pain can feel like:  Burning or tingling on the surface of your chest or deep in your chest.  Crushing, pressure, aching,  or squeezing pain.  Dull or sharp pain that is worse when you move, cough, or take a deep breath.  Pain that is also felt in your back, neck, shoulder, or arm, or pain that spreads to any of these areas. Your chest pain may come and go, or it may stay constant. DIAGNOSIS Lab tests or other studies may be needed to find the cause of your pain. Your health care provider may have you take a test called an ambulatory ECG (electrocardiogram). An ECG records your heartbeat patterns at the time the test is performed. You may also have other tests, such as:  Transthoracic echocardiogram (TTE). During echocardiography, sound waves are used to create a picture of all of the heart structures and to look at how blood flows through your heart.  Transesophageal echocardiogram (TEE).This is a more advanced imaging test that obtains images from inside your body. It allows your health care provider to see your heart in finer detail.  Cardiac monitoring. This allows your health care provider to monitor your heart rate and rhythm in real time.  Holter monitor. This is a portable device that records your heartbeat and can help to diagnose abnormal heartbeats. It allows your health care provider to track your heart activity for several days, if needed.  Stress tests. These can be done through exercise or by taking medicine that makes your heart beat more quickly.  Blood tests.  Imaging tests. TREATMENT  Your treatment depends on what is causing your chest pain. Treatment may include:  Medicines. These may include:  Acid blockers for heartburn.  Anti-inflammatory medicine.  Pain  medicine for inflammatory conditions.  Antibiotic medicine, if an infection is present.  Medicines to dissolve blood clots.  Medicines to treat coronary artery disease.  Supportive care for conditions that do not require medicines. This may include:  Resting.  Applying heat or cold packs to injured areas.  Limiting  activities until pain decreases. HOME CARE INSTRUCTIONS  If you were prescribed an antibiotic medicine, finish it all even if you start to feel better.  Avoid any activities that bring on chest pain.  Do not use any tobacco products, including cigarettes, chewing tobacco, or electronic cigarettes. If you need help quitting, ask your health care provider.  Do not drink alcohol.  Take medicines only as directed by your health care provider.  Keep all follow-up visits as directed by your health care provider. This is important. This includes any further testing if your chest pain does not go away.  If heartburn is the cause for your chest pain, you may be told to keep your head raised (elevated) while sleeping. This reduces the chance that acid will go from your stomach into your esophagus.  Make lifestyle changes as directed by your health care provider. These may include:  Getting regular exercise. Ask your health care provider to suggest some activities that are safe for you.  Eating a heart-healthy diet. A registered dietitian can help you to learn healthy eating options.  Maintaining a healthy weight.  Managing diabetes, if necessary.  Reducing stress. SEEK MEDICAL CARE IF:  Your chest pain does not go away after treatment.  You have a rash with blisters on your chest.  You have a fever. SEEK IMMEDIATE MEDICAL CARE IF:   Your chest pain is worse.  You have an increasing cough, or you cough up blood.  You have severe abdominal pain.  You have severe weakness.  You faint.  You have chills.  You have sudden, unexplained chest discomfort.  You have sudden, unexplained discomfort in your arms, back, neck, or jaw.  You have shortness of breath at any time.  You suddenly start to sweat, or your skin gets clammy.  You feel nauseous or you vomit.  You suddenly feel light-headed or dizzy.  Your heart begins to beat quickly, or it feels like it is skipping  beats. These symptoms may represent a serious problem that is an emergency. Do not wait to see if the symptoms will go away. Get medical help right away. Call your local emergency services (911 in the [place] Do not drive yourself to the hospital.   This information is not intended to replace advice given to you by your health care provider. Make sure you discuss any questions you have with your health care provider.   Document Released: 05/23/2005 Document Revised: 09/03/2014 Document Reviewed: 03/19/2014 Elsevier Interactive Patient Education [employer].

## 2015-10-21 NOTE — ED Notes (Signed)
Pt walked around the nurses station with 99% oxygen level. Pt denied short of breath and no dizziness.

## 2015-10-21 NOTE — Progress Notes (Signed)
Packet to be mailed.

## 2015-10-21 NOTE — Progress Notes (Signed)
   Subjective:    Patient ID: Kathleen [name], female    DOB: Apr 09, 1961, 55 y.o.   MRN: [id number]  Abdominal Pain This is a new problem. Episode onset: 2 days ago. The pain is located in the LUQ. Pertinent negatives include no constipation, diarrhea, fever, nausea or vomiting. Associated symptoms comments: Chest pain and sob. Treatments tried: neb treatments.   WEnt to aph ed last night.   ER notes were reviewed.   Review of Systems  Constitutional: Positive for fatigue. Negative for fever.  HENT: Negative for congestion and rhinorrhea.   Respiratory: Positive for cough, shortness of breath and wheezing.   Gastrointestinal: Positive for abdominal pain. Negative for nausea, vomiting, diarrhea and constipation.       Objective:   Physical Exam  Constitutional: She appears well-developed.  HENT:  Head: Normocephalic.  Nose: Nose normal.  Mouth/Throat: Oropharynx is clear and moist. No oropharyngeal exudate.  Neck: Neck supple.  Cardiovascular: Normal rate and normal heart sounds.   No murmur heard. Pulmonary/Chest: Effort normal and breath sounds normal. She has no wheezes.  Lymphadenopathy:    She has no cervical adenopathy.  Skin: Skin is warm and dry.  Nursing note and vitals reviewed.   she has severe hip arthritis but is scared to get the surgery completed she will stick with her current medicines       Assessment & Plan:   dyspnea-patient has underlying condition with fibrosis. She needs a stat d-dimer. Await the results of this. May need CT scan. Patient states O2 sat is good.   addendum- d-dimer came back negative. No need for CT scan Lipoma left upper quadrant discomfort probably more musculoskeletal. I find no evidence of any type underlying tumor growth issues. I would recommend Hemoccult cards as a precaution. We will mail those to her.

## 2015-10-21 NOTE — ED Notes (Signed)
Pt ambulated back and forth to BR without difficulty.

## 2015-10-28 MED FILL — oxyCODONE HCL 5 MG TABS: 5 | 30 days supply | Qty: 120 | Fill #0

## 2015-11-01 DIAGNOSIS — M87052 Idiopathic aseptic necrosis of left femur: Secondary | ICD-10-CM | POA: Diagnosis not present

## 2015-11-02 DIAGNOSIS — R0602 Shortness of breath: Secondary | ICD-10-CM | POA: Diagnosis not present

## 2015-11-02 DIAGNOSIS — D869 Sarcoidosis, unspecified: Secondary | ICD-10-CM | POA: Diagnosis not present

## 2015-11-02 DIAGNOSIS — N6019 Diffuse cystic mastopathy of unspecified breast: Secondary | ICD-10-CM | POA: Diagnosis not present

## 2015-11-02 DIAGNOSIS — K219 Gastro-esophageal reflux disease without esophagitis: Secondary | ICD-10-CM | POA: Diagnosis not present

## 2015-11-02 DIAGNOSIS — Z803 Family history of malignant neoplasm of breast: Secondary | ICD-10-CM | POA: Diagnosis not present

## 2015-11-02 DIAGNOSIS — F329 Major depressive disorder, single episode, unspecified: Secondary | ICD-10-CM | POA: Diagnosis not present

## 2015-11-02 DIAGNOSIS — Z79899 Other long term (current) drug therapy: Secondary | ICD-10-CM | POA: Diagnosis not present

## 2015-11-02 MED FILL — ALPRAZolam 1 MG TABS: 1 | 30 days supply | Qty: 90 | Fill #1

## 2015-11-14 ENCOUNTER — Ambulatory Visit (INDEPENDENT_AMBULATORY_CARE_PROVIDER_SITE_OTHER): Payer: 59 | Admitting: Family Medicine

## 2015-11-14 ENCOUNTER — Encounter: Payer: Self-pay | Admitting: Family Medicine

## 2015-11-14 VITALS — BP 122/84 | Ht 68.0 in | Wt 209.0 lb

## 2015-11-14 DIAGNOSIS — D869 Sarcoidosis, unspecified: Secondary | ICD-10-CM | POA: Diagnosis not present

## 2015-11-14 DIAGNOSIS — K5909 Other constipation: Secondary | ICD-10-CM

## 2015-11-14 DIAGNOSIS — K581 Irritable bowel syndrome with constipation: Secondary | ICD-10-CM | POA: Insufficient documentation

## 2015-11-14 DIAGNOSIS — K5900 Constipation, unspecified: Secondary | ICD-10-CM | POA: Insufficient documentation

## 2015-11-14 DIAGNOSIS — M797 Fibromyalgia: Secondary | ICD-10-CM | POA: Diagnosis not present

## 2015-11-14 DIAGNOSIS — M87052 Idiopathic aseptic necrosis of left femur: Secondary | ICD-10-CM

## 2015-11-14 MED ORDER — LINACLOTIDE 145 MCG PO CAPS: 145.0000 ug | ORAL_CAPSULE | Freq: Every day | ORAL | Status: DC

## 2015-11-14 MED ORDER — OXYCODONE HCL 5 MG PO TABS: 5.0000 mg | ORAL_TABLET | Freq: Four times a day (QID) | ORAL | Status: DC | PRN

## 2015-11-14 MED ORDER — SERTRALINE HCL 50 MG PO TABS: ORAL_TABLET | ORAL | Status: DC

## 2015-11-14 MED FILL — SERTRALINE HCL 50 MG TABLET: 50 | 30 days supply | Qty: 30 | Fill #0

## 2015-11-14 NOTE — Progress Notes (Signed)
Subjective:    Patient ID: Kathleen [name], female    DOB: 1961-04-24, 55 y.o.   MRN: [id number]  HPI  This patient was seen today for chronic pain  The medication list was reviewed and updated.   -Compliance with medication: yes  - Number patient states they take daily: 4 tabs a day  -when was the last dose patient took? today  The patient was advised the importance of maintaining medication and not using illegal substances with these.  Refills needed: yes  The patient was educated that we can provide 3 monthly scripts for their medication, it is their responsibility to follow the instructions.  Side effects or complications from medications: constipation issues  Patient is aware that pain medications are meant to minimize the severity of the pain to allow their pain levels to improve to allow for better function. They are aware of that pain medications cannot totally remove their pain.  Due for UDT ( at least once per year) : Later this year  Has multiple worries 25 minutes was spent with the patient. Greater than half the time was spent in discussion and answering questions and counseling regarding the issues that the patient came in for today. Concern of constipation -She states that she would like to try some have something called in she is up-to-date on her colonoscopy denies any bleeding she believes that this constipation is related to her pain medicine. Patient also with significant depression followed by psychiatry has a hard time getting over how her health is. Finds herself sad but not suicidal. She also has cysts pulmonary fibrosis/sarcoid she is on Imuran which seems to be doing a fairly decent job She has chronic pain from her hip which is inoperable currently because of being on Imuran     Review of Systems  Constitutional: Negative for activity change and appetite change.  Gastrointestinal: Negative for vomiting and abdominal pain.  Neurological: Negative for  weakness.  Psychiatric/Behavioral: Negative for confusion.       Objective:   Physical Exam  Constitutional: She appears well-nourished. No distress.  HENT:  Head: Normocephalic.  Cardiovascular: Normal rate, regular rhythm and normal heart sounds.   No murmur heard. Pulmonary/Chest: Effort normal and breath sounds normal.  Musculoskeletal: She exhibits no edema.  Lymphadenopathy:    She has no cervical adenopathy.  Neurological: She is alert.  Psychiatric: Her behavior is normal.  Vitals reviewed.   25 minutes was spent with the patient. Greater than half the time was spent in discussion and answering questions and counseling regarding the issues that the patient came in for today.       Assessment & Plan:  Depression-patient still having difficult time adjusting to her overall health issues. Does see psychiatry. I would recommend starting Zoloft 50 mg. Half tablet per day for the first week then 1 tablet daily  Recheck patient within 4-6 weeks  Patient encouraged to follow-up with psychiatry  The patient was seen today as part of a comprehensive visit regarding pain control. Patient's compliance with the medication as well as discussion regarding effectiveness was completed. Prescriptions were written. Patient was advised to follow-up in 3 months. The patient was assessed for any signs of severe side effects. The patient was advised to take the medicine as directed and to report to [place] if any side effect issues.  Sarcoidosis stable. On lifelong Imuran Patient with inoperable hip avascular necrosis. The specialist will not let her have surgery while on Imuran the other specialist  will not let her get off Imuran.

## 2015-11-29 ENCOUNTER — Encounter (HOSPITAL_COMMUNITY): Payer: 59

## 2015-12-06 ENCOUNTER — Ambulatory Visit (INDEPENDENT_AMBULATORY_CARE_PROVIDER_SITE_OTHER): Payer: 59 | Admitting: Psychiatry

## 2015-12-06 ENCOUNTER — Encounter (HOSPITAL_COMMUNITY): Payer: Self-pay | Admitting: Psychiatry

## 2015-12-06 VITALS — BP 93/46 | HR 75 | Ht 68.0 in | Wt 208.0 lb

## 2015-12-06 DIAGNOSIS — F411 Generalized anxiety disorder: Secondary | ICD-10-CM

## 2015-12-06 DIAGNOSIS — F332 Major depressive disorder, recurrent severe without psychotic features: Secondary | ICD-10-CM

## 2015-12-06 MED ORDER — ALPRAZOLAM 1 MG PO TABS: 1.0000 mg | ORAL_TABLET | Freq: Three times a day (TID) | ORAL | Status: DC

## 2015-12-06 MED ORDER — SERTRALINE HCL 100 MG PO TABS: 100.0000 mg | ORAL_TABLET | Freq: Every day | ORAL | Status: DC

## 2015-12-06 MED FILL — oxyCODONE HCL 5 MG TABS: 5 | 30 days supply | Qty: 120 | Fill #0

## 2015-12-06 MED FILL — SERTRALINE HCL 100 MG TAB: 100 | 30 days supply | Qty: 30 | Fill #0
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
## 2015-12-07 ENCOUNTER — Encounter (HOSPITAL_COMMUNITY): Payer: 59

## 2015-12-21 ENCOUNTER — Encounter (HOSPITAL_COMMUNITY): Payer: Self-pay | Admitting: Psychology

## 2015-12-21 ENCOUNTER — Ambulatory Visit (INDEPENDENT_AMBULATORY_CARE_PROVIDER_SITE_OTHER): Payer: 59 | Admitting: Psychology

## 2015-12-21 DIAGNOSIS — F332 Major depressive disorder, recurrent severe without psychotic features: Secondary | ICD-10-CM

## 2015-12-21 DIAGNOSIS — F411 Generalized anxiety disorder: Secondary | ICD-10-CM | POA: Diagnosis not present

## 2015-12-21 MED FILL — ALPRAZolam 1 MG TABS: 1 | 30 days supply | Qty: 90 | Fill #2

## 2015-12-21 MED FILL — predniSONE 5 MG TABS: 5 | 90 days supply | Qty: 90 | Fill #1

## 2015-12-21 NOTE — Progress Notes (Signed)
PROGRESS NOTE  Patient:  Kathleen [name]   DOB: 02-22-1961  MR Number: [id number]  Location: [facility] [address] [place] [address] Dept: [phone]  Start: 9 AM End: 10 AM  Provider/Observer:     Edgardo Roys PSYD  Chief Complaint:      Chief Complaint  Patient presents with  . Depression  . Anxiety    Reason For Service:    The patient was referred back to our office by her treating physicians Sallee Lange, MD do to significant increases in anxiety and depression I seen the patient back in 2003 and again in 2009 for severe depression and anxiety. Significant psychosocial features and stressors were present at that time. The patient also been diagnosed with fibromyalgia and other pain related conditions. 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 She was told 10 years ago according to the patient, that her condition was in a non-active phase. However, she had increasing problems with breathing and even though her doctor at that time did not think it was an active stay Dr. Wolfgang Phoenix set her up to see someone in [place] because of her concerns and his concerns about the situation. She was diagnosed with an active case and had lost 50% of her lung function and needed immune suppressant medications. Since that time, she is developed other subsequent conditions likely as a result of these needed medications. This includes the development of an active case of shingles which is causing her a great deal of pain and bladder difficulties at this point. The patient has been experiencing severe depression and anxiety and constant ruminations about frustration and fear towards past Drs. that the patient felt did not listen to her and her reports of difficulties. These are the patient's impressions and has not had any communications with previous  doctors but it is clear that she is very upset about the permanent loss of lung function and he is enormously grateful to Dr. Wolfgang Phoenix for identifying the need for further intervention and setting her up with the specialist in [facility] who is able to diagnosis and begin treating her condition.  The patient has continued to get treatment for her lung damage/condition.  But she is now having complications from the treatment that is essential including possible issues with hip.  She is also likely having blood flow issues with feet and hands (rhanoids?).  The patient is returning with worsening lung functioning and degenerative hip issues due to blood flow.  However, the patient can not have surgery due to medications for her lungs and and pulmonary functioning.   Interventions Strategy:  Cognitive/behavioral psychotherapeutic interventions  Participation Level:   Active  Participation Quality:  Appropriate      Behavioral Observation:  Well Groomed, Alert, and Depressed and Tearful.   Current Psychosocial Factors: The patient returns reporting that family stresses have improved some, but her continued deteriating heath functioning, both orthopedic and lung functioning are leaving her unable to do many things at all.  Content of Session:   Review current symptoms and continued work on therapeutic interventions.  Current Status:   The patient reports that she has had resurgence of her anxiety and depression and is not able to work due to breathing issues and other medical issues.  Patient Progress:   Stable but continued to be severely depressed.  Target Goals:   Reduce the intensity,  frequency, and duration of severe depression.  Last Reviewed:   12/21/2015  Goals Addressed Today:    Worked on issues specifically with her ruminating nature regarding her severe depression and anxiety.  Impression/Diagnosis:   The patient is clearly overwhelmed by her current situation and significant  reduction in physical functioning primarily related to her autoimmune disorder and its significant deleterious effect it is having on her pulmonary function. She has been told she has at least lost 50% of her lung function and it will not come back and they're trying to keep her from losing more. The patient has a history of significant depression and anxiety in the past as well as traumatic experiences. The patient is severely depressed and anxious at this point and overwhelmed by her status in ruminating about her situation.   Diagnosis:    Axis I: Generalized anxiety disorder  Severe episode of recurrent major depressive disorder, without psychotic features ([place])        Rmani Kellogg R, PsyD 12/21/2015    Anastasija Anfinson R, PsyD 12/21/2015   Edgardo Roys, PsyD 12/21/2015

## 2015-12-28 ENCOUNTER — Ambulatory Visit (INDEPENDENT_AMBULATORY_CARE_PROVIDER_SITE_OTHER): Payer: 59 | Admitting: Family Medicine

## 2015-12-28 ENCOUNTER — Encounter: Payer: Self-pay | Admitting: Family Medicine

## 2015-12-28 ENCOUNTER — Encounter (HOSPITAL_COMMUNITY): Payer: Self-pay

## 2015-12-28 ENCOUNTER — Inpatient Hospital Stay (HOSPITAL_COMMUNITY)
Admission: EM | Admit: 2015-12-28 | Discharge: 2016-01-02 | DRG: 391 | Disposition: A | Discharge status: Home Health | Payer: 59 | Attending: Internal Medicine | Admitting: Internal Medicine

## 2015-12-28 ENCOUNTER — Emergency Department (HOSPITAL_COMMUNITY): Payer: 59

## 2015-12-28 VITALS — BP 104/74 | Temp 97.8°F | Ht 68.0 in | Wt 201.5 lb

## 2015-12-28 DIAGNOSIS — D869 Sarcoidosis, unspecified: Secondary | ICD-10-CM

## 2015-12-28 DIAGNOSIS — R0902 Hypoxemia: Secondary | ICD-10-CM | POA: Diagnosis present

## 2015-12-28 DIAGNOSIS — R05 Cough: Secondary | ICD-10-CM

## 2015-12-28 DIAGNOSIS — A084 Viral intestinal infection, unspecified: Secondary | ICD-10-CM

## 2015-12-28 DIAGNOSIS — N390 Urinary tract infection, site not specified: Secondary | ICD-10-CM | POA: Diagnosis present

## 2015-12-28 DIAGNOSIS — R112 Nausea with vomiting, unspecified: Secondary | ICD-10-CM | POA: Diagnosis not present

## 2015-12-28 DIAGNOSIS — F411 Generalized anxiety disorder: Secondary | ICD-10-CM | POA: Diagnosis present

## 2015-12-28 DIAGNOSIS — K529 Noninfective gastroenteritis and colitis, unspecified: Secondary | ICD-10-CM | POA: Diagnosis not present

## 2015-12-28 DIAGNOSIS — R0609 Other forms of dyspnea: Secondary | ICD-10-CM

## 2015-12-28 DIAGNOSIS — D62 Acute posthemorrhagic anemia: Secondary | ICD-10-CM | POA: Diagnosis present

## 2015-12-28 DIAGNOSIS — E860 Dehydration: Secondary | ICD-10-CM | POA: Diagnosis present

## 2015-12-28 DIAGNOSIS — M797 Fibromyalgia: Secondary | ICD-10-CM | POA: Diagnosis present

## 2015-12-28 DIAGNOSIS — I959 Hypotension, unspecified: Secondary | ICD-10-CM | POA: Diagnosis present

## 2015-12-28 DIAGNOSIS — R571 Hypovolemic shock: Secondary | ICD-10-CM | POA: Diagnosis not present

## 2015-12-28 DIAGNOSIS — R001 Bradycardia, unspecified: Secondary | ICD-10-CM | POA: Diagnosis not present

## 2015-12-28 DIAGNOSIS — R079 Chest pain, unspecified: Secondary | ICD-10-CM | POA: Diagnosis present

## 2015-12-28 DIAGNOSIS — R0600 Dyspnea, unspecified: Secondary | ICD-10-CM

## 2015-12-28 DIAGNOSIS — M87052 Idiopathic aseptic necrosis of left femur: Secondary | ICD-10-CM

## 2015-12-28 DIAGNOSIS — N939 Abnormal uterine and vaginal bleeding, unspecified: Secondary | ICD-10-CM | POA: Diagnosis present

## 2015-12-28 DIAGNOSIS — N3000 Acute cystitis without hematuria: Secondary | ICD-10-CM

## 2015-12-28 DIAGNOSIS — Z7952 Long term (current) use of systemic steroids: Secondary | ICD-10-CM

## 2015-12-28 DIAGNOSIS — F32A Depression, unspecified: Secondary | ICD-10-CM

## 2015-12-28 DIAGNOSIS — R1084 Generalized abdominal pain: Secondary | ICD-10-CM | POA: Diagnosis not present

## 2015-12-28 DIAGNOSIS — K219 Gastro-esophageal reflux disease without esophagitis: Secondary | ICD-10-CM | POA: Diagnosis not present

## 2015-12-28 DIAGNOSIS — R0602 Shortness of breath: Secondary | ICD-10-CM

## 2015-12-28 DIAGNOSIS — R059 Cough, unspecified: Secondary | ICD-10-CM

## 2015-12-28 DIAGNOSIS — F329 Major depressive disorder, single episode, unspecified: Secondary | ICD-10-CM | POA: Diagnosis not present

## 2015-12-28 DIAGNOSIS — J9811 Atelectasis: Secondary | ICD-10-CM | POA: Diagnosis not present

## 2015-12-28 LAB — COMPREHENSIVE METABOLIC PANEL
ALT: 10 U/L — ABNORMAL LOW (ref 14–54)
AST: 13 U/L — ABNORMAL LOW (ref 15–41)
Albumin: 3.8 g/dL (ref 3.5–5.0)
Alkaline Phosphatase: 63 U/L (ref 38–126)
Anion gap: 8 (ref 5–15)
BUN: 8 mg/dL (ref 6–20)
CO2: 25 mmol/L (ref 22–32)
Calcium: 9 mg/dL (ref 8.9–10.3)
Chloride: 104 mmol/L (ref 101–111)
Creatinine, Ser: 0.77 mg/dL (ref 0.44–1.00)
GFR calc Af Amer: >60 mL/min (ref >60)
GFR calc non Af Amer: >60 mL/min (ref >60)
Glucose, Bld: 101 mg/dL — ABNORMAL HIGH (ref 65–99)
Potassium: 3.7 mmol/L (ref 3.5–5.1)
Sodium: 137 mmol/L (ref 135–145)
Total Bilirubin: 0.5 mg/dL (ref 0.3–1.2)
Total Protein: 7.4 g/dL (ref 6.5–8.1)

## 2015-12-28 LAB — LIPASE, BLOOD: Lipase: 21 U/L (ref 11–51)

## 2015-12-28 LAB — CBC
HCT: 31.9 % — ABNORMAL LOW (ref 36.0–46.0)
Hemoglobin: 10.7 g/dL — ABNORMAL LOW (ref 12.0–15.0)
MCH: 29.6 pg (ref 26.0–34.0)
MCHC: 33.5 g/dL (ref 30.0–36.0)
MCV: 88.4 fL (ref 78.0–100.0)
Platelets: 243 10*3/uL (ref 150–400)
RBC: 3.61 MIL/uL — ABNORMAL LOW (ref 3.87–5.11)
RDW: 14.4 % (ref 11.5–15.5)
WBC: 8.3 10*3/uL (ref 4.0–10.5)

## 2015-12-28 LAB — TROPONIN I: Troponin I: <0.03 ng/mL (ref <0.031)

## 2015-12-28 LAB — MRSA PCR SCREENING: MRSA by PCR: NEGATIVE

## 2015-12-28 LAB — D-DIMER, QUANTITATIVE (NOT AT ARMC): D-Dimer, Quant: 0.5 ug/mL-FEU (ref 0.00–0.50)

## 2015-12-28 MED ORDER — SODIUM CHLORIDE 0.9 % IV SOLN
Freq: Once | INTRAVENOUS | Status: AC
  Administered 2015-12-28: 16:00:00 via INTRAVENOUS

## 2015-12-28 MED ORDER — ONDANSETRON HCL 4 MG/2ML IJ SOLN
4.0000 mg | Freq: Once | INTRAMUSCULAR | Status: AC
  Administered 2015-12-28: 4 mg via INTRAVENOUS
  Filled 2015-12-28: qty 2

## 2015-12-28 MED ORDER — FENTANYL CITRATE (PF) 100 MCG/2ML IJ SOLN
50.0000 ug | Freq: Once | INTRAMUSCULAR | Status: AC
  Administered 2015-12-28: 50 ug via INTRAVENOUS
  Filled 2015-12-28: qty 2

## 2015-12-28 MED ORDER — SODIUM CHLORIDE 0.9% FLUSH
3.0000 mL | Freq: Two times a day (BID) | INTRAVENOUS | Status: DC
  Administered 2015-12-28 – 2016-01-02 (×8): 3 mL via INTRAVENOUS

## 2015-12-28 MED ORDER — SODIUM CHLORIDE 0.9 % IV BOLUS (SEPSIS)
1000.0000 mL | Freq: Once | INTRAVENOUS | Status: AC
  Administered 2015-12-28: 1000 mL via INTRAVENOUS

## 2015-12-28 MED ORDER — SERTRALINE HCL 50 MG PO TABS: 100.0000 mg | ORAL_TABLET | Freq: Every day | ORAL | Status: DC

## 2015-12-28 MED ORDER — ONDANSETRON HCL 8 MG PO TABS: 8.0000 mg | ORAL_TABLET | Freq: Three times a day (TID) | ORAL | Status: DC | PRN

## 2015-12-28 MED ORDER — ACETAMINOPHEN 325 MG PO TABS
650.0000 mg | ORAL_TABLET | Freq: Four times a day (QID) | ORAL | Status: DC | PRN
  Administered 2015-12-28 – 2015-12-30 (×5): 650 mg via ORAL
  Filled 2015-12-28 (×5): qty 2

## 2015-12-28 MED ORDER — PANTOPRAZOLE SODIUM 40 MG IV SOLR
40.0000 mg | Freq: Once | INTRAVENOUS | Status: AC
  Administered 2015-12-28: 40 mg via INTRAVENOUS
  Filled 2015-12-28: qty 40

## 2015-12-28 MED ORDER — OXYCODONE HCL 5 MG PO TABS: 5.0000 mg | ORAL_TABLET | Freq: Four times a day (QID) | ORAL | Status: DC | PRN

## 2015-12-28 MED ORDER — ENOXAPARIN SODIUM 40 MG/0.4ML ~~LOC~~ SOLN
40.0000 mg | SUBCUTANEOUS | Status: DC
  Administered 2015-12-28 – 2015-12-30 (×3): 40 mg via SUBCUTANEOUS
  Filled 2015-12-28 (×3): qty 0.4

## 2015-12-28 MED ORDER — ONDANSETRON HCL 4 MG/2ML IJ SOLN
4.0000 mg | Freq: Four times a day (QID) | INTRAMUSCULAR | Status: DC | PRN
  Administered 2015-12-28 – 2015-12-29 (×2): 4 mg via INTRAVENOUS
  Filled 2015-12-28 (×2): qty 2

## 2015-12-28 MED ORDER — SERTRALINE HCL 50 MG PO TABS
100.0000 mg | ORAL_TABLET | Freq: Every day | ORAL | Status: DC
  Administered 2015-12-28 – 2016-01-02 (×6): 100 mg via ORAL
  Filled 2015-12-28 (×6): qty 2

## 2015-12-28 MED ORDER — OXYCODONE HCL 5 MG PO TABS
5.0000 mg | ORAL_TABLET | Freq: Four times a day (QID) | ORAL | Status: DC | PRN
  Administered 2015-12-28 – 2016-01-02 (×7): 5 mg via ORAL
  Filled 2015-12-28 (×7): qty 1

## 2015-12-28 MED ORDER — ZOLPIDEM TARTRATE 5 MG PO TABS
5.0000 mg | ORAL_TABLET | Freq: Once | ORAL | Status: AC
  Administered 2015-12-28: 5 mg via ORAL
  Filled 2015-12-28: qty 1

## 2015-12-28 MED ORDER — PREDNISONE 10 MG PO TABS
5.0000 mg | ORAL_TABLET | Freq: Every day | ORAL | Status: DC
  Administered 2015-12-29: 5 mg via ORAL
  Filled 2015-12-28: qty 1

## 2015-12-28 MED ORDER — SODIUM CHLORIDE 0.9 % IV SOLN
INTRAVENOUS | Status: DC
  Administered 2015-12-28: 18:00:00 via INTRAVENOUS

## 2015-12-28 MED ORDER — BUDESONIDE 0.5 MG/2ML IN SUSP
0.5000 mg | Freq: Every day | RESPIRATORY_TRACT | Status: DC | PRN
  Administered 2015-12-29: 0.5 mg via RESPIRATORY_TRACT
  Filled 2015-12-28: qty 2

## 2015-12-28 MED ORDER — METOCLOPRAMIDE HCL 5 MG/ML IJ SOLN
10.0000 mg | Freq: Once | INTRAMUSCULAR | Status: AC
  Administered 2015-12-28: 10 mg via INTRAVENOUS
  Filled 2015-12-28: qty 2

## 2015-12-28 MED ORDER — ALPRAZOLAM 0.5 MG PO TABS
0.5000 mg | ORAL_TABLET | Freq: Three times a day (TID) | ORAL | Status: DC | PRN
  Administered 2015-12-28 – 2015-12-29 (×2): 1 mg via ORAL
  Filled 2015-12-28 (×2): qty 2

## 2015-12-28 MED ORDER — ONDANSETRON HCL 4 MG PO TABS: 4.0000 mg | ORAL_TABLET | Freq: Four times a day (QID) | ORAL | Status: DC | PRN

## 2015-12-28 MED ORDER — SODIUM CHLORIDE 0.9 % IV SOLN: INTRAVENOUS | Status: DC

## 2015-12-28 MED ORDER — FENTANYL CITRATE (PF) 100 MCG/2ML IJ SOLN
50.0000 ug | Freq: Once | INTRAMUSCULAR | Status: DC
  Filled 2015-12-28: qty 2

## 2015-12-28 MED ORDER — ALBUTEROL SULFATE (2.5 MG/3ML) 0.083% IN NEBU: 2.5000 mg | INHALATION_SOLUTION | RESPIRATORY_TRACT | Status: DC | PRN

## 2015-12-28 MED ORDER — AZATHIOPRINE 50 MG PO TABS
100.0000 mg | ORAL_TABLET | Freq: Every morning | ORAL | Status: DC
  Administered 2015-12-29 – 2016-01-02 (×5): 100 mg via ORAL
  Filled 2015-12-28 (×7): qty 2

## 2015-12-28 MED ORDER — PANTOPRAZOLE SODIUM 40 MG PO TBEC
40.0000 mg | DELAYED_RELEASE_TABLET | Freq: Every morning | ORAL | Status: DC
  Administered 2015-12-29 – 2016-01-02 (×5): 40 mg via ORAL
  Filled 2015-12-28 (×5): qty 1

## 2015-12-28 MED ORDER — ACETAMINOPHEN 650 MG RE SUPP: 650.0000 mg | Freq: Four times a day (QID) | RECTAL | Status: DC | PRN

## 2015-12-28 MED ORDER — ENOXAPARIN SODIUM 40 MG/0.4ML ~~LOC~~ SOLN
40.0000 mg | SUBCUTANEOUS | Status: DC
Start: 2015-12-28 — End: 2015-12-28

## 2015-12-28 MED ORDER — FERROUS SULFATE 325 (65 FE) MG PO TABS
325.0000 mg | ORAL_TABLET | Freq: Every day | ORAL | Status: DC
Start: 2015-12-28 — End: 2016-01-02
  Administered 2015-12-28 – 2016-01-02 (×6): 325 mg via ORAL
  Filled 2015-12-28 (×6): qty 1

## 2015-12-28 MED FILL — ONDANSETRON HCL 8 MG TABLET: 8 | 6 days supply | Qty: 20 | Fill #0

## 2015-12-28 NOTE — ED Notes (Signed)
Patient unable to void. Dr. Lacinda Axon made aware.

## 2015-12-28 NOTE — ED Notes (Signed)
EDP at bedside  

## 2015-12-28 NOTE — Patient Instructions (Signed)
As for your Xanax I recommend only to use 1/2 tablet 3 times a day as needed for anxiety to lessen the risk of accidental overdose with the pain med

## 2015-12-28 NOTE — ED Notes (Signed)
Dr. Lacinda Axon notified of pt's bp and 02 sat.  Placed pt on 02 at 2liters. Sat increased to 92%.

## 2015-12-28 NOTE — ED Notes (Signed)
Complaining of left side pain for 1-2 weeks. States some urinary issues. Has to self cath PRN

## 2015-12-28 NOTE — ED Notes (Addendum)
Dr. Lacinda Axon made aware of patient BP. No new orders given.

## 2015-12-28 NOTE — H&P (Addendum)
History and Physical    Kathleen [name] [id number] DOB: 1960-12-07 DOA: 12/28/2015  Referring MD/NP/PA: Dr. Lacinda Axon PCP: Kathleen Lange, MD  Outpatient Specialists: Dr. Deon Pilling, [facility] pulmonology Patient coming from: Home  Chief Complaint: Vomiting  HPI: Kathleen [name] is a 55 y.o. female with medical history significant of sarcoidosis, pulmonary fibrosis, anemia, anxiety, mitral valve prolapse; who presents with complaints of nausea and vomiting. Symptoms have been ongoing over the last 2-3 days. Notes that she cannot think of anything in particular that brought about symptoms. Denies any new foods, restaurants, or sick contacts. She has had associated symptoms of diarrhea, left upper quadrant pain, decreased urine output, generalized weakness, lightheadedness, and intermittent substernal chest pain. She denies having any loss of consciousness, falls, fever, chills, leg swelling, focal weakness, changes in vision, or new rashes. She did not try anything prior to presenting.  Patient was able to drive herself here to the emergency department today.  ED Course: Upon admission into the emergency department patient blood pressure was seen to be as low as 78/31, because desaturations seen to be as low as 88%, and all other vital signs within normal limits. Patient was given 4 L of IV fluids with systolic blood pressure still seen to be in the mid 80s.  Review of Systems: As per HPI otherwise 10 point review of systems negative.    Past Medical History  Diagnosis Date  . Sarcoidosis ([place])   . Breast fibrocystic disorder   . Mitral valve prolapse   . Breast pain     right  . Normal stress echocardiogram Jan 2011  . Normal nuclear stress test December 2012  . Normal echocardiogram December 2012    EF 55 to 60%; mild MVP, grade 1 diastolic dysfunction, PAP 32  . Fibromyalgia   . DOE (dyspnea on exertion)   . Pulmonary fibrosis ([place])   . Gastroesophageal reflux disease   . Heart disease   .  Anemia     Past Surgical History  Procedure Laterality Date  . Tubal ligation    . Breast lumpectomy  01/12/2011    right     reports that she has never smoked. She has never used smokeless tobacco. She reports that she does not drink alcohol or use illicit drugs.  Allergies  Allergen Reactions  . Bee Venom Anaphylaxis  . Benadryl [Diphenhydramine Hcl] Nausea Only and Swelling    "makes my throat close up" "makes my throat close up"  . Codeine Itching    Other reaction(s): ITCHING  . Diphenhydramine Nausea Only    "makes my throat close up" "makes my throat close up"  . Morphine And Related Other (See Comments)    Unknown reaction- pt gets sick, dizzy, and confused Intolerance, not an allergy, received this in the emergency department recently without a problem.    Family History  Problem Relation Age of Onset  . Cardiomyopathy Mother   . Breast cancer Mother   . Heart disease Maternal Aunt   . Prostate cancer Father   . Colon cancer Neg Hx   . Neurofibromatosis Brother   . Bipolar disorder Daughter     Prior to Admission medications   Medication Sig Start Date End Date Taking? Authorizing Provider  ALPRAZolam Duanne Moron) 1 MG tablet Take 1 tablet (1 mg total) by mouth 3 (three) times daily. Patient taking differently: Take 0.5-1 mg by mouth 3 (three) times daily as needed for anxiety.  12/06/15  Yes Cloria Spring, MD  azaTHIOprine (  IMURAN) 50 MG tablet Take 100 mg by mouth every morning.    Yes Historical Provider, MD  budesonide (PULMICORT) 0.5 MG/2ML nebulizer solution Take 0.5 mg by nebulization daily as needed (for shortness of breath).  05/23/15 05/22/16 Yes Historical Provider, MD  EPINEPHrine 0.3 mg/0.3 mL IJ SOAJ injection Inject 0.3 mLs (0.3 mg total) into the muscle once. 02/18/14  Yes [name], MD  ferrous sulfate 325 (65 FE) MG EC tablet Take 325 mg by mouth daily.   Yes Historical Provider, MD  oxyCODONE (OXY IR/ROXICODONE) 5 MG immediate release tablet Take  1 tablet (5 mg total) by mouth 4 (four) times daily as needed for severe pain. 12/28/15  Yes [name], MD  pantoprazole (PROTONIX) 40 MG tablet Take 40 mg by mouth every morning.   Yes Historical Provider, MD  predniSONE (DELTASONE) 5 MG tablet Take 5 mg by mouth daily with breakfast.  09/12/15  Yes Historical Provider, MD  sertraline (ZOLOFT) 100 MG tablet Take 1 tablet (100 mg total) by mouth daily. 12/06/15 12/05/16 Yes Cloria Spring, MD  Linaclotide [facility]) 145 MCG CAPS capsule Take 1 capsule (145 mcg total) by mouth daily. Patient not taking: Reported on 12/28/2015 11/14/15   [name], MD  ondansetron (ZOFRAN) 8 MG tablet Take 1 tablet (8 mg total) by mouth every 8 (eight) hours as needed for nausea. 12/28/15   [name], MD    Physical Exam: Filed Vitals:   12/28/15 1436 12/28/15 1500 12/28/15 1530 12/28/15 1614  BP: 83/44 79/40 80/32  84/46  Pulse:  73 72 75  Temp:      TempSrc:      Resp:  13 21 18   Height:      Weight:      SpO2: 88% 100% 99% 100%      Constitutional:Patient is ill appearing, but nontoxic, able to follow commands.  Filed Vitals:   12/28/15 1436 12/28/15 1500 12/28/15 1530 12/28/15 1614  BP: 83/44 79/40 80/32  84/46  Pulse:  73 72 75  Temp:      TempSrc:      Resp:  13 21 18   Height:      Weight:      SpO2: 88% 100% 99% 100%   Eyes: PERRL, lids and conjunctivae normal ENMT:  dry mucous membranes. Posterior pharynx clear of any exudate or lesions.Normal dentition.  Neck: normal, supple, no masses, no thyromegaly Respiratory: Tachypneic, but able to talk in complete sentences Cardiovascular: Tachycardic no murmurs / rubs / gallops. No extremity edema. 2+ pedal pulses. No carotid bruits.  Abdomen: no tenderness, no masses palpated. No hepatosplenomegaly. Bowel sounds positive.  Musculoskeletal: no clubbing / cyanosis. No joint deformity upper and lower extremities. Good ROM, no contractures. Normal muscle tone.  Skin: no rashes, lesions,  ulcers. No induration Neurologic: CN 2-12 grossly intact. Sensation intact, DTR normal. Strength 4+/5 in all 4.  Psychiatric: Normal judgment and insight. Alert and oriented x 3. Normal mood.   (  Labs on Admission: I have personally reviewed following labs and imaging studies  CBC:  Recent Labs Lab 12/28/15 1120  WBC 8.3  HGB 10.7*  HCT 31.9*  MCV 88.4  PLT 0000000   Basic Metabolic Panel:  Recent Labs Lab 12/28/15 1120  NA 137  K 3.7  CL 104  CO2 25  GLUCOSE 101*  BUN 8  CREATININE 0.77  CALCIUM 9.0   GFR: Estimated Creatinine Clearance: 99.4 mL/min (by C-G formula based on Cr of 0.77). Liver  Function Tests:  Recent Labs Lab 12/28/15 1120  AST 13*  ALT 10*  ALKPHOS 63  BILITOT 0.5  PROT 7.4  ALBUMIN 3.8    Recent Labs Lab 12/28/15 1120  LIPASE 21   No results for input(s): AMMONIA in the last 168 hours. Coagulation Profile: No results for input(s): INR, PROTIME in the last 168 hours. Cardiac Enzymes:  Recent Labs Lab 12/28/15 1120  TROPONINI <0.03   BNP (last 3 results) No results for input(s): PROBNP in the last 8760 hours. HbA1C: No results for input(s): HGBA1C in the last 72 hours. CBG: No results for input(s): GLUCAP in the last 168 hours. Lipid Profile: No results for input(s): CHOL, HDL, LDLCALC, TRIG, CHOLHDL, LDLDIRECT in the last 72 hours. Thyroid Function Tests: No results for input(s): TSH, T4TOTAL, FREET4, T3FREE, THYROIDAB in the last 72 hours. Anemia Panel: No results for input(s): VITAMINB12, FOLATE, FERRITIN, TIBC, IRON, RETICCTPCT in the last 72 hours. Urine analysis:    Component Value Date/Time   COLORURINE YELLOW 06/16/2014 [place] 06/16/2014 0942   LABSPEC 1.007 06/16/2014 0942   PHURINE 5.5 06/16/2014 0942   GLUCOSEU NEG 06/16/2014 0942   GLUCOSEU NEGATIVE 05/29/2010 1622   HGBUR NEG 06/16/2014 0942   BILIRUBINUR Positive 08/16/2015 0928   BILIRUBINUR NEG 06/16/2014 0942   KETONESUR NEG  06/16/2014 0942   PROTEINUR NEG 06/16/2014 0942   PROTEINUR 30 06/15/2014 0952   UROBILINOGEN 0.2 06/16/2014 0942   NITRITE NEG 06/16/2014 0942   LEUKOCYTESUR moderate (2+)* 08/16/2015 0928   Sepsis Labs:No results found for this or any previous visit (from the past 240 hour(s)).   Radiological Exams on Admission: Dg Chest Port 1 View  12/28/2015  CLINICAL DATA:  Chest pain, cough, history of pulmonary fibrosis EXAM: PORTABLE CHEST 1 VIEW COMPARISON:  10/20/2015 FINDINGS: Cardiomediastinal silhouette is stable. Again noted chronic interstitial prominence and fibrotic changes especially in upper lobes. Slight worsening mild perihilar interstitial prominence. Mild superimposed interstitial edema cannot be excluded. Mild basilar atelectasis. IMPRESSION: Again noted chronic interstitial prominence and fibrotic changes especially in upper lobes. Slight worsening mild perihilar interstitial prominence. Mild superimposed interstitial edema cannot be excluded. Mild basilar atelectasis. Electronically Signed   By: Lahoma Crocker M.D.   On: 12/28/2015 14:10      Assessment/Plan Suspect Severe Dehydration with hypotension: question if 2/2 Nausea and vomiting or infection - Admit to stepdown - follow-up blood cultres - IV fluids 125 mL per hour - check 8 am cortisol level  UTI: Acute. UA seen to be positive once obtained - follow up urine culture  - Antibiotics of Roecphin     Nausea and vomiting - Zofran prn  Chest pain - Trend cardiac troponins - Follow-up telemetry overnight  Pulmonary fibrosis history - continue Prednisone and Azathioprine  Anxiety - Continue zoloft and Xanax  Prn   DVT prophylaxis: Lovenox Code Status: Full  Family Communication: None Disposition Plan:  Consults called: None Admission status: observation stepdown unit   Norval Morton MD [facility] Pager [phone]  If 7PM-7AM, please contact night-coverage [email or web address] Password  [email or web address]  12/28/2015, 4:15 PM

## 2015-12-28 NOTE — Progress Notes (Signed)
   Subjective:    Patient ID: Kathleen [name], female    DOB: [birth year], 55 y.o.   MRN: [id number]  HPI Patient is here today for a follow up visit on depression. Patient is currently taking Zoloft 100 mg daily. Patient tates that the medication is not helping her symptoms.  Patient states that she has vomiting, diarrhea and congestion. Onset 3 days ago. Denies fever chills sweats denies bloody stools relates vomiting diarrhea states energy level subpar PMH benign  Sarcoid is on medication suppresses immune system which makes it harder for her to fight infection Her hip still has avascular necrosis no orthopedic is willing to do surgery while she is on Imuran Chronic pain she uses medication takes the edge off pain  Severe depression she follows up with psychiatry not suicidal but it does affect her well-being She is disabled  Review of Systems    see above Objective:   Physical Exam  Neck no masses HEENT benign cough noted not respiratory distress heart regular abdomen soft no guarding rebound or tenderness extremities left hip pain discomfort limited range of motion    Assessment & Plan:  1. Avascular necrosis of bone of left hip ([place]) Very difficult situation so far her pulmonologist does not one are in a have surgery if she has to come off of her medicine and all the orthopedics do not want to do the surgery if she is taking Imuran so she is soreness.currently she is using pain medicine and using a walker she is disabled  2. Sarcoidosis ([place]) Sarcoidosis stable on Imuran see specialist on a regular basis  3. DOE (dyspnea on exertion) She does get dyspnea on exertion but this is worse when she has respiratory infections. She is on her medication denies any fever  4. Cough Cough stable no x-rays indicated today  5. Depression Depression severe depression sees specialist is on medication hopefully they can help her I have advised her to only use half of the Xanax no more than 3  times per day to lessen the risk of interaction  6. Generalized abdominal pain Generalized abdominal achiness but denies fever chills or bloody stools lab work indicated - Lipase - CBC with Differential/Platelet - Basic metabolic panel - Hepatic function panel  7. Viral gastroenteritis Viral gastroenteritis Zofran as necessary clear liquids if progression or troubles follow-up go to ER if worse - Lipase - CBC with Differential/Platelet - Basic metabolic panel - Hepatic function panel  Chronic pain prescriptions given today follow-up 3 months

## 2015-12-28 NOTE — ED Notes (Signed)
Pt reports that she started with nausea, vomiting and diarrhea on Sunday and it continues. Feels weak

## 2015-12-28 NOTE — ED Provider Notes (Signed)
CSN: [id number]     Arrival date & time 12/28/15  1111 History  By signing my name below, I, [name], attest that this documentation has been prepared under the direction and in the presence of Nat Christen, MD Electronically Signed: Ladene Artist, ED Scribe 12/28/2015 at 12:42 PM   Chief Complaint  Patient presents with  . Emesis   The history is provided by the patient. No language interpreter was used.   HPI Comments: Kathleen [name] is a 55 y.o. female who presents to the Emergency Department complaining of multiple episodes of emesis for the past 3 days. Pt reports associated symptoms of nausea, diarrhea and LUQ pain that radiates to the lateral aspect for the past 1-2 weeks. No treatments tried PTA. No sick contacts.Patient was able to drive herself to the emergency department. Patient goes to [facility] for her pulmonary fibrosis. Primary care physician in [place] is Dr. Mickie Hillier  Past Medical History  Diagnosis Date  . Sarcoidosis ([place])   . Breast fibrocystic disorder   . Mitral valve prolapse   . Breast pain     right  . Normal stress echocardiogram Jan 2011  . Normal nuclear stress test December 2012  . Normal echocardiogram December 2012    EF 55 to 60%; mild MVP, grade 1 diastolic dysfunction, PAP 32  . Fibromyalgia   . DOE (dyspnea on exertion)   . Pulmonary fibrosis ([place])   . Gastroesophageal reflux disease   . Heart disease   . Anemia    Past Surgical History  Procedure Laterality Date  . Tubal ligation    . Breast lumpectomy  01/12/2011    right   Family History  Problem Relation Age of Onset  . Cardiomyopathy Mother   . Breast cancer Mother   . Heart disease Maternal Aunt   . Prostate cancer Father   . Colon cancer Neg Hx   . Neurofibromatosis Brother   . Bipolar disorder Daughter    Social History  Substance Use Topics  . Smoking status: Never Smoker   . Smokeless tobacco: Never Used  . Alcohol Use: No   OB History    No data  available     Review of Systems  All other systems reviewed and are negative.  A complete 10 system review of systems was obtained and all systems are negative except as noted in the HPI and PMH.  Allergies  Bee venom; Benadryl; Codeine; Diphenhydramine; and Morphine and related  Home Medications   Prior to Admission medications   Medication Sig Start Date End Date Taking? Authorizing Provider  ALPRAZolam Duanne Moron) 1 MG tablet Take 1 tablet (1 mg total) by mouth 3 (three) times daily. Patient taking differently: Take 0.5-1 mg by mouth 3 (three) times daily as needed for anxiety.  12/06/15  Yes Cloria Spring, MD  azaTHIOprine (IMURAN) 50 MG tablet Take 100 mg by mouth every morning.    Yes Historical Provider, MD  budesonide (PULMICORT) 0.5 MG/2ML nebulizer solution Take 0.5 mg by nebulization daily as needed (for shortness of breath).  05/23/15 05/22/16 Yes Historical Provider, MD  EPINEPHrine 0.3 mg/0.3 mL IJ SOAJ injection Inject 0.3 mLs (0.3 mg total) into the muscle once. 02/18/14  Yes [name], MD  ferrous sulfate 325 (65 FE) MG EC tablet Take 325 mg by mouth daily.   Yes Historical Provider, MD  oxyCODONE (OXY IR/ROXICODONE) 5 MG immediate release tablet Take 1 tablet (5 mg total) by mouth 4 (four) times  daily as needed for severe pain. 12/28/15  Yes [name], MD  pantoprazole (PROTONIX) 40 MG tablet Take 40 mg by mouth every morning.   Yes Historical Provider, MD  predniSONE (DELTASONE) 5 MG tablet Take 5 mg by mouth daily with breakfast.  09/12/15  Yes Historical Provider, MD  sertraline (ZOLOFT) 100 MG tablet Take 1 tablet (100 mg total) by mouth daily. 12/06/15 12/05/16 Yes Cloria Spring, MD  Linaclotide [facility]) 145 MCG CAPS capsule Take 1 capsule (145 mcg total) by mouth daily. Patient not taking: Reported on 12/28/2015 11/14/15   [name], MD  ondansetron (ZOFRAN) 8 MG tablet Take 1 tablet (8 mg total) by mouth every 8 (eight) hours as needed for nausea. 12/28/15   [name], MD   BP 118/64 mmHg  Pulse 91  Temp(Src) 98.3 F (36.8 C) (Oral)  Resp 18  Ht 5\' 10"  (1.778 m)  Wt 200 lb (90.719 kg)  BMI 28.70 kg/m2  SpO2 100%  LMP 12/26/2015 Physical Exam  Constitutional: She is oriented to person, place, and time. She appears well-developed and well-nourished.  Pale. Slightly dehydrated.   HENT:  Head: Normocephalic and atraumatic.  Eyes: Conjunctivae and EOM are normal. Pupils are equal, round, and reactive to light.  Neck: Normal range of motion. Neck supple.  Cardiovascular: Normal rate and regular rhythm.   Pulmonary/Chest: Effort normal and breath sounds normal.  Abdominal: Soft. Bowel sounds are normal. There is tenderness in the left upper quadrant.  LUQ radiation to lateral aspect  Musculoskeletal: Normal range of motion.  Neurological: She is alert and oriented to person, place, and time.  Skin: Skin is warm and dry.  Psychiatric: She has a normal mood and affect. Her behavior is normal.  Nursing note and vitals reviewed.  ED Course  Procedures (including critical care time) DIAGNOSTIC STUDIES: Oxygen Saturation is 100% on RA, normal by my interpretation.    COORDINATION OF CARE: 11:33 AM-Discussed treatment plan which includes IV with pt at bedside and pt agreed to plan.   Labs Review Labs Reviewed  COMPREHENSIVE METABOLIC PANEL - Abnormal; Notable for the following:    Glucose, Bld 101 (*)    AST 13 (*)    ALT 10 (*)    All other components within normal limits  CBC - Abnormal; Notable for the following:    RBC 3.61 (*)    Hemoglobin 10.7 (*)    HCT 31.9 (*)    All other components within normal limits  LIPASE, BLOOD  TROPONIN I  URINALYSIS, ROUTINE W REFLEX MICROSCOPIC (NOT AT [facility])  POC URINE PREG, ED   Imaging Review Dg Chest Port 1 View  12/28/2015  CLINICAL DATA:  Chest pain, cough, history of pulmonary fibrosis EXAM: PORTABLE CHEST 1 VIEW COMPARISON:  10/20/2015 FINDINGS: Cardiomediastinal silhouette is stable.  Again noted chronic interstitial prominence and fibrotic changes especially in upper lobes. Slight worsening mild perihilar interstitial prominence. Mild superimposed interstitial edema cannot be excluded. Mild basilar atelectasis. IMPRESSION: Again noted chronic interstitial prominence and fibrotic changes especially in upper lobes. Slight worsening mild perihilar interstitial prominence. Mild superimposed interstitial edema cannot be excluded. Mild basilar atelectasis. Electronically Signed   By: Lahoma Crocker M.D.   On: 12/28/2015 14:10   I have personally reviewed and evaluated these images and lab results as part of my medical decision-making.   EKG Interpretation   Date/Time:  Wednesday Dec 28 2015 13:29:21 EDT Ventricular Rate:  83 PR Interval:  145 QRS Duration: 88  QT Interval:  361 QTC Calculation: 424 R Axis:   85 Text Interpretation:  Sinus rhythm Low voltage, precordial leads Probable  anteroseptal infarct, old Nonspecific T abnormalities, inferior leads  Confirmed by Albeiro Trompeter  MD, Vashawn Ekstein ([id number]) on 12/28/2015 1:36:01 PM      MDM   Final diagnoses:  Gastroenteritis  Hypotension, unspecified hypotension type    Patient has required 3 L of IV fluids and has not urinated yet. She is hypotensive, but not septic appearing. Will admit for dehydration.  I personally performed the services described in this documentation, which was scribed in my presence. The recorded information has been reviewed and is accurate.     Nat Christen, MD 12/28/15 [id number]

## 2015-12-29 ENCOUNTER — Observation Stay (HOSPITAL_BASED_OUTPATIENT_CLINIC_OR_DEPARTMENT_OTHER): Payer: 59

## 2015-12-29 ENCOUNTER — Inpatient Hospital Stay (HOSPITAL_COMMUNITY): Payer: 59

## 2015-12-29 DIAGNOSIS — M797 Fibromyalgia: Secondary | ICD-10-CM | POA: Diagnosis present

## 2015-12-29 DIAGNOSIS — N390 Urinary tract infection, site not specified: Secondary | ICD-10-CM | POA: Diagnosis present

## 2015-12-29 DIAGNOSIS — J849 Interstitial pulmonary disease, unspecified: Secondary | ICD-10-CM | POA: Diagnosis not present

## 2015-12-29 DIAGNOSIS — R571 Hypovolemic shock: Secondary | ICD-10-CM | POA: Diagnosis not present

## 2015-12-29 DIAGNOSIS — D869 Sarcoidosis, unspecified: Secondary | ICD-10-CM | POA: Diagnosis present

## 2015-12-29 DIAGNOSIS — I959 Hypotension, unspecified: Secondary | ICD-10-CM | POA: Diagnosis present

## 2015-12-29 DIAGNOSIS — D62 Acute posthemorrhagic anemia: Secondary | ICD-10-CM | POA: Diagnosis not present

## 2015-12-29 DIAGNOSIS — A084 Viral intestinal infection, unspecified: Secondary | ICD-10-CM | POA: Diagnosis present

## 2015-12-29 DIAGNOSIS — K529 Noninfective gastroenteritis and colitis, unspecified: Secondary | ICD-10-CM | POA: Diagnosis not present

## 2015-12-29 DIAGNOSIS — R9431 Abnormal electrocardiogram [ECG] [EKG]: Secondary | ICD-10-CM

## 2015-12-29 DIAGNOSIS — R079 Chest pain, unspecified: Secondary | ICD-10-CM | POA: Diagnosis not present

## 2015-12-29 DIAGNOSIS — R0902 Hypoxemia: Secondary | ICD-10-CM | POA: Diagnosis present

## 2015-12-29 DIAGNOSIS — R001 Bradycardia, unspecified: Secondary | ICD-10-CM | POA: Diagnosis not present

## 2015-12-29 DIAGNOSIS — R112 Nausea with vomiting, unspecified: Secondary | ICD-10-CM | POA: Diagnosis not present

## 2015-12-29 DIAGNOSIS — Z7952 Long term (current) use of systemic steroids: Secondary | ICD-10-CM | POA: Diagnosis not present

## 2015-12-29 DIAGNOSIS — N939 Abnormal uterine and vaginal bleeding, unspecified: Secondary | ICD-10-CM | POA: Diagnosis present

## 2015-12-29 DIAGNOSIS — F411 Generalized anxiety disorder: Secondary | ICD-10-CM | POA: Diagnosis not present

## 2015-12-29 DIAGNOSIS — K219 Gastro-esophageal reflux disease without esophagitis: Secondary | ICD-10-CM | POA: Diagnosis present

## 2015-12-29 DIAGNOSIS — E860 Dehydration: Secondary | ICD-10-CM | POA: Diagnosis not present

## 2015-12-29 LAB — CBC
HCT: 26.5 % — ABNORMAL LOW (ref 36.0–46.0)
Hemoglobin: 9 g/dL — ABNORMAL LOW (ref 12.0–15.0)
MCH: 30.4 pg (ref 26.0–34.0)
MCHC: 34 g/dL (ref 30.0–36.0)
MCV: 89.5 fL (ref 78.0–100.0)
Platelets: 300 10*3/uL (ref 150–400)
RBC: 2.96 MIL/uL — ABNORMAL LOW (ref 3.87–5.11)
RDW: 14.7 % (ref 11.5–15.5)
WBC: 4.9 10*3/uL (ref 4.0–10.5)

## 2015-12-29 LAB — BASIC METABOLIC PANEL
Anion gap: 7 (ref 5–15)
BUN/Creatinine Ratio: 10 (ref 9–23)
BUN: 8 mg/dL (ref 6–24)
BUN: 4 mg/dL — ABNORMAL LOW (ref 6–20)
CO2: 23 mmol/L (ref 18–29)
CO2: 22 mmol/L (ref 22–32)
Calcium: 9.6 mg/dL (ref 8.7–10.2)
Calcium: 7.2 mg/dL — ABNORMAL LOW (ref 8.9–10.3)
Chloride: 99 mmol/L (ref 96–106)
Chloride: 114 mmol/L — ABNORMAL HIGH (ref 101–111)
Creatinine, Ser: 0.82 mg/dL (ref 0.57–1.00)
Creatinine, Ser: 0.74 mg/dL (ref 0.44–1.00)
GFR calc Af Amer: 94 mL/min/{1.73_m2} (ref >59)
GFR calc Af Amer: >60 mL/min (ref >60)
GFR calc non Af Amer: 82 mL/min/{1.73_m2} (ref >59)
GFR calc non Af Amer: >60 mL/min (ref >60)
Glucose, Bld: 91 mg/dL (ref 65–99)
Glucose: 93 mg/dL (ref 65–99)
Potassium: 4 mmol/L (ref 3.5–5.2)
Potassium: 4.1 mmol/L (ref 3.5–5.1)
Sodium: 139 mmol/L (ref 134–144)
Sodium: 143 mmol/L (ref 135–145)

## 2015-12-29 LAB — CBC WITH DIFFERENTIAL/PLATELET
Basophils Absolute: 0.1 10*3/uL (ref 0.0–0.2)
Basos: 1 %
EOS (ABSOLUTE): 0.1 10*3/uL (ref 0.0–0.4)
Eos: 1 %
Hematocrit: 33.9 % — ABNORMAL LOW (ref 34.0–46.6)
Hemoglobin: 11.6 g/dL (ref 11.1–15.9)
Immature Grans (Abs): 0 10*3/uL (ref 0.0–0.1)
Immature Granulocytes: 0 %
Lymphocytes Absolute: 0.6 10*3/uL — ABNORMAL LOW (ref 0.7–3.1)
Lymphs: 7 %
MCH: 29.6 pg (ref 26.6–33.0)
MCHC: 34.2 g/dL (ref 31.5–35.7)
MCV: 87 fL (ref 79–97)
Monocytes Absolute: 0.7 10*3/uL (ref 0.1–0.9)
Monocytes: 7 %
Neutrophils Absolute: 8.3 10*3/uL — ABNORMAL HIGH (ref 1.4–7.0)
Neutrophils: 84 %
Platelets: 469 10*3/uL — ABNORMAL HIGH (ref 150–379)
RBC: 3.92 x10E6/uL (ref 3.77–5.28)
RDW: 14.3 % (ref 12.3–15.4)
WBC: 9.9 10*3/uL (ref 3.4–10.8)

## 2015-12-29 LAB — ECHOCARDIOGRAM COMPLETE
Height: 70 in
Weight: 3343.94 oz

## 2015-12-29 LAB — LACTIC ACID, PLASMA
Lactic Acid, Venous: 0.7 mmol/L (ref 0.5–2.0)
Lactic Acid, Venous: 0.5 mmol/L (ref 0.5–2.0)

## 2015-12-29 LAB — CORTISOL-AM, BLOOD: Cortisol - AM: 9.7 ug/dL (ref 6.7–22.6)

## 2015-12-29 LAB — HEPATIC FUNCTION PANEL
ALT: 6 IU/L (ref 0–32)
AST: 15 IU/L (ref 0–40)
Albumin: 4.1 g/dL (ref 3.5–5.5)
Alkaline Phosphatase: 73 IU/L (ref 39–117)
Bilirubin Total: 0.4 mg/dL (ref 0.0–1.2)
Bilirubin, Direct: 0.11 mg/dL (ref 0.00–0.40)
Total Protein: 7 g/dL (ref 6.0–8.5)

## 2015-12-29 LAB — LIPASE: Lipase: 38 U/L (ref 0–59)

## 2015-12-29 MED ORDER — DEXTROSE 5 % IV SOLN
INTRAVENOUS | Status: AC
  Filled 2015-12-29: qty 10

## 2015-12-29 MED ORDER — PROCHLORPERAZINE EDISYLATE 5 MG/ML IJ SOLN
10.0000 mg | Freq: Four times a day (QID) | INTRAMUSCULAR | Status: DC | PRN
  Administered 2015-12-29: 10 mg via INTRAVENOUS
  Filled 2015-12-29: qty 2

## 2015-12-29 MED ORDER — DEXTROSE 5 % IV SOLN
1.0000 g | INTRAVENOUS | Status: DC
  Filled 2015-12-29: qty 10

## 2015-12-29 MED ORDER — ALPRAZOLAM 0.5 MG PO TABS
0.5000 mg | ORAL_TABLET | Freq: Every evening | ORAL | Status: DC | PRN
  Administered 2015-12-29: 0.5 mg via ORAL
  Filled 2015-12-29: qty 1

## 2015-12-29 MED ORDER — PHENYLEPHRINE HCL 10 MG/ML IJ SOLN
0.0000 ug/min | INTRAMUSCULAR | Status: DC
  Administered 2015-12-29: 25 ug/min via INTRAVENOUS
  Administered 2015-12-29 – 2015-12-30 (×2): 20 ug/min via INTRAVENOUS
  Administered 2015-12-30: 13.333 ug/min via INTRAVENOUS
  Administered 2015-12-30: 25 ug/min via INTRAVENOUS
  Administered 2016-01-01: 6.667 ug/min via INTRAVENOUS
  Filled 2015-12-29 (×4): qty 1

## 2015-12-29 MED ORDER — SODIUM CHLORIDE 0.9 % IV BOLUS (SEPSIS)
500.0000 mL | Freq: Once | INTRAVENOUS | Status: AC
  Administered 2015-12-29: 500 mL via INTRAVENOUS

## 2015-12-29 MED ORDER — PHENYLEPHRINE HCL 10 MG/ML IJ SOLN
INTRAMUSCULAR | Status: AC
  Filled 2015-12-29: qty 1

## 2015-12-29 MED ORDER — DEXTROSE 5 % IV SOLN
1.0000 g | INTRAVENOUS | Status: DC
  Administered 2015-12-29: 1 g via INTRAVENOUS
  Filled 2015-12-29 (×5): qty 10

## 2015-12-29 MED ORDER — IPRATROPIUM-ALBUTEROL 0.5-2.5 (3) MG/3ML IN SOLN
3.0000 mL | Freq: Four times a day (QID) | RESPIRATORY_TRACT | Status: DC
  Administered 2015-12-29 – 2015-12-31 (×8): 3 mL via RESPIRATORY_TRACT
  Filled 2015-12-29 (×8): qty 3

## 2015-12-29 MED ORDER — HYDROCORTISONE NA SUCCINATE PF 100 MG IJ SOLR
100.0000 mg | Freq: Three times a day (TID) | INTRAMUSCULAR | Status: DC
  Administered 2015-12-29 – 2015-12-31 (×7): 100 mg via INTRAVENOUS
  Filled 2015-12-29 (×6): qty 2

## 2015-12-29 NOTE — Progress Notes (Signed)
PROGRESS NOTE    Kathleen [name]  [id number] DOB: 04-14-1961 DOA: 12/28/2015 PCP: Sallee Lange, MD Outpatient Specialists:  Dr. Deon Pilling, [facility] pulmonology   Brief Narrative:  60 yof with a hx of pulmonary fibrosis, anemia, mitral valve prolapse, and sarcoidosis, presented with complaints of nausea and vomiting with associated diarrhea, left upper quadrant pain, generalized weakness, lightheadedness, decreased urine output, and intermittent substernal chest pain. She denies eating any new foods, restaurants, or sick contacts. ED workup showed her to be hypotensive and hypoxic. She was given 4L IVFs and referred for admission. She will have a PICC line inserted 5/04.    Assessment & Plan:   Principal Problem:   Hypotension Active Problems:   Sarcoidosis (HCC)   CHEST PAIN   Anxiety state   Dehydration   Nausea and vomiting   UTI (urinary tract infection)   1. Hypotension. Suspect severe dehydration due to vomiting and UTI. Her blood pressure does not normally run low. Continue IVFs. Follow up cortisol level. Will give stress dose steroids and consider pressors via PICC line. Will check CVP and follow up blood cx. Obtain urine studies. She is otherwise afebrile at this time and does not appear septic. Check lactic acid.  2. Gastroenteritis. Pt presents with persistent vomiting and diarrhea. Will order GI panel and stool for C diff. Continue supportive tx with antiemetics. Continue clear liquids for now. Follow up urine cx. Blood cx negative at 12 hrs.   3. Nausea and vomiting. Zofran PRN. 4. Chest pain. Appears atypical / pleuritic. Cardiac enzymes thus far have been negative. She does have T-wave inversions in her inferior leads. Will check ECHO. CXR showed no acute changes. Trend troponins. Continue to monitor on telemetry.  5. Hx of sarcoidosis with pulmonary fibrosis. Continue steroids and azathioprine. 6. Anxiety. Continue zoloft and xanax PRN.   DVT prophylaxis:  Lovenox Code Status: Full Family Communication: no family present at bedside.  Disposition Plan: Continue to monitor in stepdown. Discharge home once improved.   Consultants:   none  Procedures:   PICC line 5/04  Antimicrobials:   Rocephin 5/03 >>    Subjective: Does not feel well. She still feels nauseated, but has not thrown up in a while. She feels very tired and weak. She has not had a fever at home. She is urinating well. Her chest hurts when she inhales and coughs.  Objective: Filed Vitals:   12/29/15 0300 12/29/15 0400 12/29/15 0500 12/29/15 0600  BP: 71/41 76/38 76/58  89/51  Pulse: 76 78 67 65  Temp:  98.1 F (36.7 C)    TempSrc:  Oral    Resp: 20 18 16 15   Height:      Weight:   94.8 kg (208 lb 15.9 oz)   SpO2: 94% 92% 100% 100%    Intake/Output Summary (Last 24 hours) at 12/29/15 0616 Last data filed at 12/29/15 0600  Gross per 24 hour  Intake 2538.75 ml  Output    500 ml  Net 2038.75 ml   Filed Weights   12/28/15 1115 12/28/15 1642 12/29/15 0500  Weight: 90.719 kg (200 lb) 94.8 kg (208 lb 15.9 oz) 94.8 kg (208 lb 15.9 oz)    Examination: General exam: Appears calm and comfortable  Respiratory system: Clear to auscultation. Respiratory effort normal. Cardiovascular system: S1 & S2 heard, RRR. No JVD, murmurs, rubs, gallops or clicks. No pedal edema. Gastrointestinal system: Abdomen is nondistended, soft and nontender. No organomegaly or masses felt. Normal bowel sounds heard. Central nervous  system: Alert and oriented. No focal neurological deficits. Extremities: Symmetric 5 x 5 power. Skin: No rashes, lesions or ulcers Psychiatry: Judgement and insight appear normal. Mood & affect appropriate.    Data Reviewed: I have personally reviewed following labs and imaging studies  CBC:  Recent Labs Lab 12/28/15 1120 12/29/15 0228  WBC 8.3 4.9  HGB 10.7* 9.0*  HCT 31.9* 26.5*  MCV 88.4 89.5  PLT 243 [id number]   Basic Metabolic Panel:  Recent  Labs Lab 12/28/15 1120 12/29/15 0228  NA 137 143  K 3.7 4.1  CL 104 114*  CO2 25 22  GLUCOSE 101* 91  BUN 8 4*  CREATININE 0.77 0.74  CALCIUM 9.0 7.2*   GFR: Estimated Creatinine Clearance: 101.4 mL/min (by C-G formula based on Cr of 0.74). Liver Function Tests:  Recent Labs Lab 12/28/15 1120  AST 13*  ALT 10*  ALKPHOS 63  BILITOT 0.5  PROT 7.4  ALBUMIN 3.8    Recent Labs Lab 12/28/15 1120  LIPASE 21   No results for input(s): AMMONIA in the last 168 hours. Coagulation Profile: No results for input(s): INR, PROTIME in the last 168 hours. Cardiac Enzymes:  Recent Labs Lab 12/28/15 1120  TROPONINI <0.03   BNP (last 3 results) No results for input(s): PROBNP in the last 8760 hours. HbA1C: No results for input(s): HGBA1C in the last 72 hours. CBG: No results for input(s): GLUCAP in the last 168 hours. Lipid Profile: No results for input(s): CHOL, HDL, LDLCALC, TRIG, CHOLHDL, LDLDIRECT in the last 72 hours. Thyroid Function Tests: No results for input(s): TSH, T4TOTAL, FREET4, T3FREE, THYROIDAB in the last 72 hours. Anemia Panel: No results for input(s): VITAMINB12, FOLATE, FERRITIN, TIBC, IRON, RETICCTPCT in the last 72 hours. Urine analysis:    Component Value Date/Time   COLORURINE YELLOW 06/16/2014 [place] 06/16/2014 0942   LABSPEC 1.007 06/16/2014 0942   PHURINE 5.5 06/16/2014 0942   GLUCOSEU NEG 06/16/2014 0942   GLUCOSEU NEGATIVE 05/29/2010 1622   HGBUR NEG 06/16/2014 0942   BILIRUBINUR Positive 08/16/2015 0928   BILIRUBINUR NEG 06/16/2014 0942   KETONESUR NEG 06/16/2014 0942   PROTEINUR NEG 06/16/2014 0942   PROTEINUR 30 06/15/2014 0952   UROBILINOGEN 0.2 06/16/2014 0942   NITRITE NEG 06/16/2014 0942   LEUKOCYTESUR moderate (2+)* 08/16/2015 0928   Sepsis Labs: @LABRCNTIP (procalcitonin:4,lacticidven:4)  ) Recent Results (from the past 240 hour(s))  MRSA PCR Screening     Status: None   Collection Time: 12/28/15  4:47  PM  Result Value Ref Range Status   MRSA by PCR NEGATIVE NEGATIVE Final    Comment:        The GeneXpert MRSA Assay (FDA approved for NASAL specimens only), is one component of a comprehensive MRSA colonization surveillance program. It is not intended to diagnose MRSA infection nor to guide or monitor treatment for MRSA infections.   Culture, blood (routine x 2)     Status: None (Preliminary result)   Collection Time: 12/29/15  2:28 AM  Result Value Ref Range Status   Specimen Description BLOOD LEFT ANTECUBITAL  Final   Special Requests BOTTLES DRAWN AEROBIC AND ANAEROBIC [facility] EACH  Final   Culture PENDING  Incomplete   Report Status PENDING  Incomplete  Culture, blood (routine x 2)     Status: None (Preliminary result)   Collection Time: 12/29/15  2:40 AM  Result Value Ref Range Status   Specimen Description BLOOD LEFT HAND  Final   Special Requests BOTTLES DRAWN  AEROBIC ONLY 4CC  Final   Culture PENDING  Incomplete   Report Status PENDING  Incomplete     Radiology Studies: Dg Chest Port 1 View  12/28/2015  CLINICAL DATA:  Chest pain, cough, history of pulmonary fibrosis EXAM: PORTABLE CHEST 1 VIEW COMPARISON:  10/20/2015 FINDINGS: Cardiomediastinal silhouette is stable. Again noted chronic interstitial prominence and fibrotic changes especially in upper lobes. Slight worsening mild perihilar interstitial prominence. Mild superimposed interstitial edema cannot be excluded. Mild basilar atelectasis. IMPRESSION: Again noted chronic interstitial prominence and fibrotic changes especially in upper lobes. Slight worsening mild perihilar interstitial prominence. Mild superimposed interstitial edema cannot be excluded. Mild basilar atelectasis. Electronically Signed   By: Lahoma Crocker M.D.   On: 12/28/2015 14:10    Scheduled Meds: . azaTHIOprine  100 mg Oral q morning - 10a  . cefTRIAXone (ROCEPHIN)  IV  1 g Intravenous STAT  . enoxaparin (LOVENOX) injection  40 mg Subcutaneous Q24H  .  fentaNYL (SUBLIMAZE) injection  50 mcg Intravenous Once  . ferrous sulfate  325 mg Oral Daily  . pantoprazole  40 mg Oral q morning - 10a  . predniSONE  5 mg Oral Q breakfast  . sertraline  100 mg Oral Daily  . sodium chloride flush  3 mL Intravenous Q12H   Continuous Infusions: . sodium chloride 125 mL/hr at 12/28/15 1743     LOS: 1 day   Time spent: 25 minutes   [name], MD [facility] Pager [phone]  If 7PM-7AM, please contact night-coverage [email or web address] Password [email or web address] 12/29/2015, 6:16 AM   By signing my name below, I, Delene Ruffini, attest that this documentation has been prepared under the direction and in the presence of [name], MD. Electronically Signed: Delene Ruffini 12/29/2015 10:27am  I, Dr. [name], personally performed the services described in this documentaiton. All medical record entries made by the scribe were at my direction and in my presence. I have reviewed the chart and agree that the record reflects my personal performance and is accurate and complete  [name], MD, 12/29/2015 11:23 AM

## 2015-12-29 NOTE — Progress Notes (Signed)
Pharmacy Antibiotic Note  Kathleen [name] is a 55 y.o. female admitted on 12/28/2015 with UTI.  Pharmacy has been consulted for ROCEPHIN dosing.  Plan: Rocephin 1gm IV q24hrs Monitor labs, progress, c/s  Height: 5\' 10"  (177.8 cm) Weight: 208 lb 15.9 oz (94.8 kg) IBW/kg (Calculated) : 68.5  Temp (24hrs), Avg:98 F (36.7 C), Min:97.5 F (36.4 C), Max:98.7 F (37.1 C)   Recent Labs Lab 12/28/15 0930 12/28/15 1120 12/29/15 0228  WBC 9.9 8.3 4.9  CREATININE 0.82 0.77 0.74    Estimated Creatinine Clearance: 101.4 mL/min (by C-G formula based on Cr of 0.74).    Allergies  Allergen Reactions  . Bee Venom Anaphylaxis  . Benadryl [Diphenhydramine Hcl] Nausea Only and Swelling    "makes my throat close up" "makes my throat close up"  . Codeine Itching    Other reaction(s): ITCHING  . Diphenhydramine Nausea Only    "makes my throat close up" "makes my throat close up"  . Morphine And Related Other (See Comments)    Unknown reaction- pt gets sick, dizzy, and confused Intolerance, not an allergy, received this in the emergency department recently without a problem.   Antimicrobials this admission: 5/4 ROCEPHIN >>   Microbiology results: 5/4 BCx: pending 5/4 UCx: pending  5/3 MRSA PCR: negative  Thank you for allowing pharmacy to be a part of this patient's care.  Hart Robinsons A 12/29/2015 8:41 AM

## 2015-12-30 DIAGNOSIS — R571 Hypovolemic shock: Secondary | ICD-10-CM | POA: Diagnosis present

## 2015-12-30 LAB — CBC
HCT: 26.1 % — ABNORMAL LOW (ref 36.0–46.0)
Hemoglobin: 8.9 g/dL — ABNORMAL LOW (ref 12.0–15.0)
MCH: 30 pg (ref 26.0–34.0)
MCHC: 34.1 g/dL (ref 30.0–36.0)
MCV: 87.9 fL (ref 78.0–100.0)
Platelets: 419 10*3/uL — ABNORMAL HIGH (ref 150–400)
RBC: 2.97 MIL/uL — ABNORMAL LOW (ref 3.87–5.11)
RDW: 14.5 % (ref 11.5–15.5)
WBC: 7.9 10*3/uL (ref 4.0–10.5)

## 2015-12-30 LAB — BASIC METABOLIC PANEL
Anion gap: 6 (ref 5–15)
BUN: <5 mg/dL — ABNORMAL LOW (ref 6–20)
CO2: 23 mmol/L (ref 22–32)
Calcium: 7.8 mg/dL — ABNORMAL LOW (ref 8.9–10.3)
Chloride: 110 mmol/L (ref 101–111)
Creatinine, Ser: 0.61 mg/dL (ref 0.44–1.00)
GFR calc Af Amer: >60 mL/min (ref >60)
GFR calc non Af Amer: >60 mL/min (ref >60)
Glucose, Bld: 128 mg/dL — ABNORMAL HIGH (ref 65–99)
Potassium: 3.5 mmol/L (ref 3.5–5.1)
Sodium: 139 mmol/L (ref 135–145)

## 2015-12-30 MED ORDER — PHENYLEPHRINE HCL 10 MG/ML IJ SOLN
INTRAMUSCULAR | Status: AC
Start: 2015-12-30 — End: 2015-12-30
  Filled 2015-12-30: qty 1

## 2015-12-30 MED ORDER — GUAIFENESIN ER 600 MG PO TB12
1200.0000 mg | ORAL_TABLET | Freq: Two times a day (BID) | ORAL | Status: DC
  Administered 2015-12-30 – 2016-01-02 (×7): 1200 mg via ORAL
  Filled 2015-12-30 (×7): qty 2

## 2015-12-30 MED ORDER — ALPRAZOLAM 0.5 MG PO TABS
1.0000 mg | ORAL_TABLET | Freq: Every evening | ORAL | Status: DC | PRN
  Administered 2015-12-30 – 2016-01-01 (×3): 1 mg via ORAL
  Filled 2015-12-30 (×3): qty 2

## 2015-12-30 MED ORDER — PHENAZOPYRIDINE HCL 100 MG PO TABS
100.0000 mg | ORAL_TABLET | Freq: Three times a day (TID) | ORAL | Status: AC
  Administered 2015-12-30 – 2016-01-01 (×6): 100 mg via ORAL
  Filled 2015-12-30 (×6): qty 1

## 2015-12-30 NOTE — Care Management Note (Signed)
Case Management Note  Patient Details  Name: Kathleen [name] MRN: [id number] Date of Birth: April 11, 1961  Subjective/Objective:                  Pt is from home, lives with her husband and is ind with ADL's. Pt plans to return home with self care at DC.   Action/Plan: No CM needs anticipated.   Expected Discharge Date:     01/01/2016             Expected Discharge Plan:  Home/Self Care  In-House Referral:  NA  Discharge planning Services  NA  Post Acute Care Choice:  NA Choice offered to:  NA  DME Arranged:    DME Agency:     HH Arranged:    HH Agency:     Status of Service:  Completed, signed off  Medicare Important Message Given:    Date Medicare IM Given:    Medicare IM give by:    Date Additional Medicare IM Given:    Additional Medicare Important Message give by:     If discussed at [place] of Stay Meetings, dates discussed:    Additional Comments:  Sherald Barge, RN 12/30/2015, 12:37 PM

## 2015-12-30 NOTE — Progress Notes (Signed)
PROGRESS NOTE    Kathleen [name]  [id number] DOB: [birth year] DOA: 12/28/2015 PCP: Sallee Lange, MD Outpatient Specialists:  Dr. Deon Pilling, [facility] pulmonology   Brief Narrative:  66 yof with a hx of pulmonary fibrosis, anemia, mitral valve prolapse, and sarcoidosis, presented with complaints of nausea and vomiting with associated diarrhea, left upper quadrant pain, generalized weakness, lightheadedness, decreased urine output, and intermittent substernal chest pain. She denies eating any new foods, restaurants, or sick contacts. ED workup showed her to be hypotensive and hypoxic. She was given 4L IVFs and referred for admission. She had a PICC line inserted 5/04.    Assessment & Plan:   Principal Problem:   Hypotension Active Problems:   Sarcoidosis (HCC)   CHEST PAIN   Anxiety state   Dehydration   Nausea and vomiting   UTI (urinary tract infection)   1. Hypovolemic shock. Improved. Suspect severe dehydration due to vomiting and diarrhea. Her blood pressure does not normally run low. Cortisol level wnl, but she is chronically on prednisone. Blood cx show no growth 12 hrs. Lactic acid 0.5. Obtain urine studies. She is otherwise afebrile at this time and does not appear septic. Continue stress dose steroids and wean off pressors as tolerated.  2. Gastroenteritis. Likely viral. Pt presents with persistent vomiting and diarrhea. She has not vomited today and has not had a BM since arriving. Follow up GI panel and stool for C diff. Continue supportive tx with antiemetics. Continue clear liquids for now and advance diet as tolerated. Follow up urine cx. Blood cx negative at 12 hrs.   3. Nausea and vomiting. Zofran PRN. 4. Chest pain. Appears atypical / pleuritic. Cardiac enzymes thus far have been negative. She does have T-wave inversions in her inferior leads. ECHO as below. CXR showed no acute changes. Trend troponins. Continue to monitor on telemetry.  5. Hx of sarcoidosis with  pulmonary fibrosis. Continue steroids and azathioprine and pulmonary hygiene. 6. Anxiety. Continue zoloft and xanax PRN.   DVT prophylaxis: Lovenox Code Status: Full Family Communication: no family present at bedside.  Disposition Plan: Continue to monitor in stepdown. Discharge home once improved.   Consultants:   none  Procedures:   PICC line 5/04  ECHO 5/04 Study Conclusions  - Left ventricle: The cavity size was normal. Wall thickness was  normal. Systolic function was normal. The estimated ejection  fraction was in the range of 60% to 65%. Wall motion was normal;  there were no regional wall motion abnormalities. Left  ventricular diastolic function parameters were normal. - Mitral valve: Mild systolic bowing without prolapse, involving  the anterior leaflet and the posterior leaflet. There was trivial  regurgitation. - Right atrium: Central venous pressure (est): 3 mm Hg. - Atrial septum: No defect or patent foramen ovale was identified. - Tricuspid valve: There was trivial regurgitation. - Pulmonary arteries: PA peak pressure: 35 mm Hg (S). - Pericardium, extracardiac: There was no pericardial effusion.  Antimicrobials:   Rocephin 5/03 >> 5/05   Subjective: Feels weak. Is still coughing constantly and complains of congestion. She threw up yesterday, but has not vomited today. She Has not had any BM since arrived. Her chest is still sore.  Objective: Filed Vitals:   12/30/15 0415 12/30/15 0430 12/30/15 0445 12/30/15 0500  BP: 105/64 124/66  117/64  Pulse: 52 66 52 49  Temp:      TempSrc:      Resp: 15 18 16 14   Height:      Weight:  98.3 kg (216 lb 11.4 oz)  SpO2: 92% 92% 93% 95%    Intake/Output Summary (Last 24 hours) at 12/30/15 [id number] Last data filed at 12/30/15 0500  Gross per 24 hour  Intake 2876.91 ml  Output   1850 ml  Net 1026.91 ml   Filed Weights   12/28/15 1642 12/29/15 0500 12/30/15 0500  Weight: 94.8 kg (208 lb 15.9 oz) 94.8  kg (208 lb 15.9 oz) 98.3 kg (216 lb 11.4 oz)    Examination:  General exam: Appears calm and comfortable  Respiratory system: Clear to auscultation. Respiratory effort normal. Cardiovascular system: S1 & S2 heard, RRR. No JVD, murmurs, rubs, gallops or clicks. No pedal edema. Gastrointestinal system: Abdomen is nondistended, soft and nontender. No organomegaly or masses felt. Normal bowel sounds heard. Central nervous system: Alert and oriented. No focal neurological deficits. Extremities: Symmetric 5 x 5 power. Skin: No rashes, lesions or ulcers Psychiatry: Judgement and insight appear normal. Mood & affect appropriate.    Data Reviewed: I have personally reviewed following labs and imaging studies  CBC:  Recent Labs Lab 12/28/15 0930 12/28/15 1120 12/29/15 0228 12/30/15 0440  WBC 9.9 8.3 4.9 7.9  NEUTROABS 8.3*  --   --   --   HGB  --  10.7* 9.0* 8.9*  HCT 33.9* 31.9* 26.5* 26.1*  MCV 87 88.4 89.5 87.9  PLT 469* 243 300 [id number]*   Basic Metabolic Panel:  Recent Labs Lab 12/28/15 0930 12/28/15 1120 12/29/15 0228 12/30/15 0440  NA 139 137 143 139  K 4.0 3.7 4.1 3.5  CL 99 104 114* 110  CO2 23 25 22 23   GLUCOSE 93 101* 91 128*  BUN 8 8 4* <5*  CREATININE 0.82 0.77 0.74 0.61  CALCIUM 9.6 9.0 7.2* 7.8*   GFR: Estimated Creatinine Clearance: 103.2 mL/min (by C-G formula based on Cr of 0.61). Liver Function Tests:  Recent Labs Lab 12/28/15 0930 12/28/15 1120  AST 15 13*  ALT 6 10*  ALKPHOS 73 63  BILITOT 0.4 0.5  PROT 7.0 7.4  ALBUMIN 4.1 3.8    Recent Labs Lab 12/28/15 0930 12/28/15 1120  LIPASE 38 21   No results for input(s): AMMONIA in the last 168 hours. Coagulation Profile: No results for input(s): INR, PROTIME in the last 168 hours. Cardiac Enzymes:  Recent Labs Lab 12/28/15 1120  TROPONINI <0.03   BNP (last 3 results) No results for input(s): PROBNP in the last 8760 hours. HbA1C: No results for input(s): HGBA1C in the last 72  hours. CBG: No results for input(s): GLUCAP in the last 168 hours. Lipid Profile: No results for input(s): CHOL, HDL, LDLCALC, TRIG, CHOLHDL, LDLDIRECT in the last 72 hours. Thyroid Function Tests: No results for input(s): TSH, T4TOTAL, FREET4, T3FREE, THYROIDAB in the last 72 hours. Anemia Panel: No results for input(s): VITAMINB12, FOLATE, FERRITIN, TIBC, IRON, RETICCTPCT in the last 72 hours. Urine analysis:    Component Value Date/Time   COLORURINE YELLOW 06/16/2014 0942   APPEARANCEUR CLEAR 06/16/2014 0942   LABSPEC 1.007 06/16/2014 0942   PHURINE 5.5 06/16/2014 0942   GLUCOSEU NEG 06/16/2014 0942   GLUCOSEU NEGATIVE 05/29/2010 1622   HGBUR NEG 06/16/2014 0942   BILIRUBINUR Positive 08/16/2015 0928   BILIRUBINUR NEG 06/16/2014 0942   KETONESUR NEG 06/16/2014 0942   PROTEINUR NEG 06/16/2014 0942   PROTEINUR 30 06/15/2014 0952   UROBILINOGEN 0.2 06/16/2014 0942   NITRITE NEG 06/16/2014 0942   LEUKOCYTESUR moderate (2+)* 08/16/2015 0928   Sepsis Labs: @LABRCNTIP (procalcitonin:4,lacticidven:4)  )  Recent Results (from the past 240 hour(s))  MRSA PCR Screening     Status: None   Collection Time: 12/28/15  4:47 PM  Result Value Ref Range Status   MRSA by PCR NEGATIVE NEGATIVE Final    Comment:        The GeneXpert MRSA Assay (FDA approved for NASAL specimens only), is one component of a comprehensive MRSA colonization surveillance program. It is not intended to diagnose MRSA infection nor to guide or monitor treatment for MRSA infections.   Culture, blood (routine x 2)     Status: None (Preliminary result)   Collection Time: 12/29/15  2:28 AM  Result Value Ref Range Status   Specimen Description BLOOD LEFT ANTECUBITAL  Final   Special Requests BOTTLES DRAWN AEROBIC AND ANAEROBIC 6CC EACH  Final   Culture NO GROWTH < 12 HOURS  Final   Report Status PENDING  Incomplete  Culture, blood (routine x 2)     Status: None (Preliminary result)   Collection Time: 12/29/15   2:40 AM  Result Value Ref Range Status   Specimen Description BLOOD LEFT HAND  Final   Special Requests BOTTLES DRAWN AEROBIC ONLY 4CC  Final   Culture NO GROWTH < 12 HOURS  Final   Report Status PENDING  Incomplete     Radiology Studies: Dg Chest Port 1 View  12/29/2015  CLINICAL DATA:  Pt c/o SOB this afternoon. Hx nonsmoker, sarcoidosis, mitral valve prolapse, pulmonary fibrosis, heart disease EXAM: PORTABLE CHEST 1 VIEW COMPARISON:  12/28/2015 FINDINGS: Upper lobe predominant interstitial thickening and irregularity is similar to prior studies consistent with interstitial lung disease, reportedly sarcoidosis. No convincing pneumonia or other acute lung abnormality. No pleural effusion.  No pneumothorax. Cardiac silhouette is top-normal in size. No mediastinal or hilar masses. Bony thorax is grossly intact. IMPRESSION: 1. No convincing acute cardiopulmonary disease. 2. Interstitial lung disease similar to prior studies. Electronically Signed   By: Lajean Manes M.D.   On: 12/29/2015 18:44   Dg Chest Port 1 View  12/28/2015  CLINICAL DATA:  Chest pain, cough, history of pulmonary fibrosis EXAM: PORTABLE CHEST 1 VIEW COMPARISON:  10/20/2015 FINDINGS: Cardiomediastinal silhouette is stable. Again noted chronic interstitial prominence and fibrotic changes especially in upper lobes. Slight worsening mild perihilar interstitial prominence. Mild superimposed interstitial edema cannot be excluded. Mild basilar atelectasis. IMPRESSION: Again noted chronic interstitial prominence and fibrotic changes especially in upper lobes. Slight worsening mild perihilar interstitial prominence. Mild superimposed interstitial edema cannot be excluded. Mild basilar atelectasis. Electronically Signed   By: Lahoma Crocker M.D.   On: 12/28/2015 14:10    Scheduled Meds: . azaTHIOprine  100 mg Oral q morning - 10a  . cefTRIAXone (ROCEPHIN)  IV  1 g Intravenous Q24H  . enoxaparin (LOVENOX) injection  40 mg Subcutaneous Q24H  .  fentaNYL (SUBLIMAZE) injection  50 mcg Intravenous Once  . ferrous sulfate  325 mg Oral Daily  . hydrocortisone sod succinate (SOLU-CORTEF) inj  100 mg Intravenous Q8H  . ipratropium-albuterol  3 mL Nebulization Q6H  . pantoprazole  40 mg Oral q morning - 10a  . sertraline  100 mg Oral Daily  . sodium chloride flush  3 mL Intravenous Q12H   Continuous Infusions: . sodium chloride 10 mL/hr at 12/29/15 1846  . phenylephrine (NEO-SYNEPHRINE) Adult infusion 25 mcg/min (12/30/15 0317)     LOS: 2 days   Time spent: 25 minutes   [name], MD [facility] Pager [phone]  If 7PM-7AM, please contact night-coverage [email or web address]  Password [email or web address] 12/30/2015, 6:32 AM   By signing my name below, I, Delene Ruffini, attest that this documentation has been prepared under the direction and in the presence of [name], MD. Electronically Signed: Delene Ruffini 12/30/2015 8:54am  I, Dr. [name], personally performed the services described in this documentaiton. All medical record entries made by the scribe were at my direction and in my presence. I have reviewed the chart and agree that the record reflects my personal performance and is accurate and complete  [name], MD, 12/30/2015 9:29 AM

## 2015-12-31 DIAGNOSIS — D62 Acute posthemorrhagic anemia: Secondary | ICD-10-CM | POA: Diagnosis present

## 2015-12-31 DIAGNOSIS — K529 Noninfective gastroenteritis and colitis, unspecified: Secondary | ICD-10-CM | POA: Insufficient documentation

## 2015-12-31 LAB — URINALYSIS, ROUTINE W REFLEX MICROSCOPIC
Bilirubin Urine: NEGATIVE
Glucose, UA: 100 mg/dL — AB
Ketones, ur: NEGATIVE mg/dL
Leukocytes, UA: NEGATIVE
Nitrite: POSITIVE — AB
Protein, ur: NEGATIVE mg/dL
Specific Gravity, Urine: <1.005 — ABNORMAL LOW (ref 1.005–1.030)
pH: 5.5 (ref 5.0–8.0)

## 2015-12-31 LAB — BASIC METABOLIC PANEL
Anion gap: 6 (ref 5–15)
BUN: 6 mg/dL (ref 6–20)
CO2: 26 mmol/L (ref 22–32)
Calcium: 8 mg/dL — ABNORMAL LOW (ref 8.9–10.3)
Chloride: 108 mmol/L (ref 101–111)
Creatinine, Ser: 0.55 mg/dL (ref 0.44–1.00)
GFR calc Af Amer: >60 mL/min (ref >60)
GFR calc non Af Amer: >60 mL/min (ref >60)
Glucose, Bld: 138 mg/dL — ABNORMAL HIGH (ref 65–99)
Potassium: 3.5 mmol/L (ref 3.5–5.1)
Sodium: 140 mmol/L (ref 135–145)

## 2015-12-31 LAB — URINE MICROSCOPIC-ADD ON

## 2015-12-31 LAB — CBC
HCT: 23.3 % — ABNORMAL LOW (ref 36.0–46.0)
Hemoglobin: 7.8 g/dL — ABNORMAL LOW (ref 12.0–15.0)
MCH: 29.7 pg (ref 26.0–34.0)
MCHC: 33.5 g/dL (ref 30.0–36.0)
MCV: 88.6 fL (ref 78.0–100.0)
Platelets: 331 10*3/uL (ref 150–400)
RBC: 2.63 MIL/uL — ABNORMAL LOW (ref 3.87–5.11)
RDW: 14.7 % (ref 11.5–15.5)
WBC: 6 10*3/uL (ref 4.0–10.5)

## 2015-12-31 MED ORDER — POLYETHYLENE GLYCOL 3350 17 G PO PACK
17.0000 g | PACK | Freq: Every day | ORAL | Status: DC
  Administered 2015-12-31 – 2016-01-02 (×3): 17 g via ORAL
  Filled 2015-12-31 (×3): qty 1

## 2015-12-31 MED ORDER — HYDROCORTISONE NA SUCCINATE PF 100 MG IJ SOLR
50.0000 mg | Freq: Three times a day (TID) | INTRAMUSCULAR | Status: DC
  Administered 2015-12-31 – 2016-01-02 (×6): 50 mg via INTRAVENOUS
  Filled 2015-12-31 (×6): qty 2

## 2015-12-31 MED ORDER — IPRATROPIUM-ALBUTEROL 0.5-2.5 (3) MG/3ML IN SOLN
3.0000 mL | Freq: Two times a day (BID) | RESPIRATORY_TRACT | Status: DC
  Administered 2016-01-01 – 2016-01-02 (×3): 3 mL via RESPIRATORY_TRACT
  Filled 2015-12-31 (×3): qty 3

## 2015-12-31 MED ORDER — MEGESTROL ACETATE 40 MG PO TABS
40.0000 mg | ORAL_TABLET | Freq: Two times a day (BID) | ORAL | Status: DC
  Administered 2015-12-31 – 2016-01-02 (×5): 40 mg via ORAL
  Filled 2015-12-31 (×9): qty 1

## 2015-12-31 MED ORDER — ALPRAZOLAM 0.5 MG PO TABS
0.5000 mg | ORAL_TABLET | Freq: Every day | ORAL | Status: DC | PRN
  Administered 2015-12-31 – 2016-01-01 (×2): 0.5 mg via ORAL
  Filled 2015-12-31 (×2): qty 1

## 2015-12-31 NOTE — Progress Notes (Signed)
PROGRESS NOTE    Kathleen [name]  [id number] DOB: [birth year] DOA: 12/28/2015 PCP: Sallee Lange, MD Outpatient Specialists:  Dr. Deon Pilling, [facility] pulmonology   Brief Narrative:  78 yof with a hx of sarcoidosis/pulmonary fibrosis, anemia, and mitral valve prolapse presented with complaints of nausea and vomiting with associated diarrhea, left upper quadrant pain, generalized weakness, lightheadedness, decreased urine output, and intermittent substernal chest pain. She denies eating any new foods, restaurants, or sick contacts. ED workup showed her to be hypotensive and hypoxic. She was given 4L IVFs and referred for admission. She had a PICC line inserted 5/04.    Assessment & Plan:   Principal Problem:   Hypotension Active Problems:   Sarcoidosis (HCC)   CHEST PAIN   Anxiety state   Dehydration   Nausea and vomiting   UTI (urinary tract infection)   Hypovolemic shock (HCC)   1. Hypovolemic shock. Improved. Suspect severe dehydration due to vomiting and diarrhea. Her blood pressure does not normally run low. Cortisol level wnl, but she is chronically on prednisone. Blood cx negative. Lactic acid 0.5. Obtain urine studies. She is otherwise afebrile at this time, her breathing has improved, and does not appear septic. Continue stress dose steroids and wean off pressors as tolerated. She has been weaned off of pressors. Will start to wean steroids.  2. Gastroenteritis. Likely viral. Pt presents with persistent vomiting and diarrhea. Blood cx negative. She has not had a BM since arriving, making C. diff unlikely. Will give stool softeners. Continue supportive tx with antiemetics. She is tolerating a solid diet. Follow up urine cx. 3. Nausea and vomiting. Zofran PRN. Improved. 4. Chest pain. Appears atypical / pleuritic. Cardiac enzymes thus far have been negative. She does have T-wave inversions in her inferior leads. ECHO as below. CXR showed no acute changes. Continue to monitor on  telemetry.  5. Hx of sarcoidosis with pulmonary fibrosis. Continue steroids and azathioprine and pulmonary hygiene. 6. Anxiety. Continue zoloft and xanax PRN. 7. Anemia, likely due to vaginal bleeding. Pt reports that she's had vaginal bleeding for the past 2 weeks with somewhat heavy flow. Her menstraal cycle has been irregular. She is followed by a gynecologist in [place]. Baseline Hgb appears to be between 11-12. Will start the pt on megace to help with vaginal bleeding until she can see her GYN. Repeat Hgb in a.m. and transfuse for Hgb less than 7. Will stop Lovenox.   DVT prophylaxis: SCDs Code Status: Full Family Communication: Discussed with patient and family at bedside.  Disposition Plan: Continue to monitor in stepdown. Discharge home once improved, hopefully tomorrow.   Consultants:   none  Procedures:   PICC line 5/04  ECHO 5/04 Study Conclusions  - Left ventricle: The cavity size was normal. Wall thickness was  normal. Systolic function was normal. The estimated ejection  fraction was in the range of 60% to 65%. Wall motion was normal;  there were no regional wall motion abnormalities. Left  ventricular diastolic function parameters were normal. - Mitral valve: Mild systolic bowing without prolapse, involving  the anterior leaflet and the posterior leaflet. There was trivial  regurgitation. - Right atrium: Central venous pressure (est): 3 mm Hg. - Atrial septum: No defect or patent foramen ovale was identified. - Tricuspid valve: There was trivial regurgitation. - Pulmonary arteries: PA peak pressure: 35 mm Hg (S). - Pericardium, extracardiac: There was no pericardial effusion.  Antimicrobials:   Rocephin 5/03 >> 5/05   Subjective: She reports frequent coughing. She has  been using flutter valve but does not believe that it is helping much. She also reports that she is starting to get anxious.  Objective: Filed Vitals:   12/31/15 0400 12/31/15 0451  12/31/15 0500 12/31/15 0600  BP: 106/55  108/59 100/63  Pulse: 45  42 45  Temp:  95.1 F (35.1 C)    TempSrc:  Oral    Resp: 18  17 17   Height:      Weight:      SpO2: 93%  92% 93%    Intake/Output Summary (Last 24 hours) at 12/31/15 0619 Last data filed at 12/30/15 2112  Gross per 24 hour  Intake   1626 ml  Output   1500 ml  Net    126 ml   Filed Weights   12/28/15 1642 12/29/15 0500 12/30/15 0500  Weight: 94.8 kg (208 lb 15.9 oz) 94.8 kg (208 lb 15.9 oz) 98.3 kg (216 lb 11.4 oz)    Examination:  General exam: Appears calm and comfortable  Respiratory system: Scattered rhonchi. Cardiovascular system: S1 & S2 heard, RRR. No JVD, murmurs, rubs, gallops or clicks. No pedal edema. Gastrointestinal system: Abdomen is nondistended, soft and nontender. No organomegaly or masses felt. Normal bowel sounds heard. Central nervous system: Alert and oriented. No focal neurological deficits. Extremities: Symmetric 5 x 5 power. Skin: No rashes, lesions or ulcers Psychiatry: Judgement and insight appear normal. Mood & affect appropriate.   Data Reviewed: I have personally reviewed following labs and imaging studies  CBC:  Recent Labs Lab 12/28/15 0930 12/28/15 1120 12/29/15 0228 12/30/15 0440 12/31/15 0425  WBC 9.9 8.3 4.9 7.9 6.0  NEUTROABS 8.3*  --   --   --   --   HGB  --  10.7* 9.0* 8.9* 7.8*  HCT 33.9* 31.9* 26.5* 26.1* 23.3*  MCV 87 88.4 89.5 87.9 88.6  PLT 469* 243 300 419* [id number]   Basic Metabolic Panel:  Recent Labs Lab 12/28/15 0930 12/28/15 1120 12/29/15 0228 12/30/15 0440 12/31/15 0425  NA 139 137 143 139 140  K 4.0 3.7 4.1 3.5 3.5  CL 99 104 114* 110 108  CO2 23 25 22 23 26   GLUCOSE 93 101* 91 128* 138*  BUN 8 8 4* <5* 6  CREATININE 0.82 0.77 0.74 0.61 0.55  CALCIUM 9.6 9.0 7.2* 7.8* 8.0*   GFR: Estimated Creatinine Clearance: 103.2 mL/min (by C-G formula based on Cr of 0.55). Liver Function Tests:  Recent Labs Lab 12/28/15 0930 12/28/15 1120    AST 15 13*  ALT 6 10*  ALKPHOS 73 63  BILITOT 0.4 0.5  PROT 7.0 7.4  ALBUMIN 4.1 3.8    Recent Labs Lab 12/28/15 0930 12/28/15 1120  LIPASE 38 21   No results for input(s): AMMONIA in the last 168 hours. Coagulation Profile: No results for input(s): INR, PROTIME in the last 168 hours. Cardiac Enzymes:  Recent Labs Lab 12/28/15 1120  TROPONINI <0.03   BNP (last 3 results) No results for input(s): PROBNP in the last 8760 hours. HbA1C: No results for input(s): HGBA1C in the last 72 hours. CBG: No results for input(s): GLUCAP in the last 168 hours. Lipid Profile: No results for input(s): CHOL, HDL, LDLCALC, TRIG, CHOLHDL, LDLDIRECT in the last 72 hours. Thyroid Function Tests: No results for input(s): TSH, T4TOTAL, FREET4, T3FREE, THYROIDAB in the last 72 hours. Anemia Panel: No results for input(s): VITAMINB12, FOLATE, FERRITIN, TIBC, IRON, RETICCTPCT in the last 72 hours. Urine analysis:    Component Value Date/Time  COLORURINE YELLOW 06/16/2014 [place] 06/16/2014 0942   LABSPEC 1.007 06/16/2014 0942   PHURINE 5.5 06/16/2014 0942   GLUCOSEU NEG 06/16/2014 0942   GLUCOSEU NEGATIVE 05/29/2010 1622   HGBUR NEG 06/16/2014 0942   BILIRUBINUR Positive 08/16/2015 0928   BILIRUBINUR NEG 06/16/2014 0942   KETONESUR NEG 06/16/2014 0942   PROTEINUR NEG 06/16/2014 0942   PROTEINUR 30 06/15/2014 0952   UROBILINOGEN 0.2 06/16/2014 0942   NITRITE NEG 06/16/2014 0942   LEUKOCYTESUR moderate (2+)* 08/16/2015 0928   Sepsis Labs: @LABRCNTIP (procalcitonin:4,lacticidven:4)  ) Recent Results (from the past 240 hour(s))  MRSA PCR Screening     Status: None   Collection Time: 12/28/15  4:47 PM  Result Value Ref Range Status   MRSA by PCR NEGATIVE NEGATIVE Final    Comment:        The GeneXpert MRSA Assay (FDA approved for NASAL specimens only), is one component of a comprehensive MRSA colonization surveillance program. It is not intended to diagnose  MRSA infection nor to guide or monitor treatment for MRSA infections.   Culture, blood (routine x 2)     Status: None (Preliminary result)   Collection Time: 12/29/15  2:28 AM  Result Value Ref Range Status   Specimen Description BLOOD LEFT ANTECUBITAL  Final   Special Requests BOTTLES DRAWN AEROBIC AND ANAEROBIC 6CC EACH  Final   Culture NO GROWTH < 12 HOURS  Final   Report Status PENDING  Incomplete  Culture, blood (routine x 2)     Status: None (Preliminary result)   Collection Time: 12/29/15  2:40 AM  Result Value Ref Range Status   Specimen Description BLOOD LEFT HAND  Final   Special Requests BOTTLES DRAWN AEROBIC ONLY 4CC  Final   Culture NO GROWTH < 12 HOURS  Final   Report Status PENDING  Incomplete     Radiology Studies: Dg Chest Port 1 View  12/29/2015  CLINICAL DATA:  Pt c/o SOB this afternoon. Hx nonsmoker, sarcoidosis, mitral valve prolapse, pulmonary fibrosis, heart disease EXAM: PORTABLE CHEST 1 VIEW COMPARISON:  12/28/2015 FINDINGS: Upper lobe predominant interstitial thickening and irregularity is similar to prior studies consistent with interstitial lung disease, reportedly sarcoidosis. No convincing pneumonia or other acute lung abnormality. No pleural effusion.  No pneumothorax. Cardiac silhouette is top-normal in size. No mediastinal or hilar masses. Bony thorax is grossly intact. IMPRESSION: 1. No convincing acute cardiopulmonary disease. 2. Interstitial lung disease similar to prior studies. Electronically Signed   By: Lajean Manes M.D.   On: 12/29/2015 18:44    Scheduled Meds: . azaTHIOprine  100 mg Oral q morning - 10a  . enoxaparin (LOVENOX) injection  40 mg Subcutaneous Q24H  . fentaNYL (SUBLIMAZE) injection  50 mcg Intravenous Once  . ferrous sulfate  325 mg Oral Daily  . guaiFENesin  1,200 mg Oral BID  . hydrocortisone sod succinate (SOLU-CORTEF) inj  100 mg Intravenous Q8H  . ipratropium-albuterol  3 mL Nebulization Q6H  . pantoprazole  40 mg Oral q  morning - 10a  . phenazopyridine  100 mg Oral TID WC  . sertraline  100 mg Oral Daily  . sodium chloride flush  3 mL Intravenous Q12H   Continuous Infusions: . sodium chloride 10 mL/hr at 12/29/15 1846  . phenylephrine (NEO-SYNEPHRINE) Adult infusion 10 mcg/min (12/31/15 0317)     LOS: 3 days   Time spent: 25 minutes   [name], MD [facility] Pager [phone]  If 7PM-7AM, please contact night-coverage [email or web address] Password [email or web address]  12/31/2015, 6:19 AM   By signing my name below, I, Delene Ruffini, attest that this documentation has been prepared under the direction and in the presence of [name], MD. Electronically Signed: Delene Ruffini 12/31/2015 10:30am  I, Dr. [name], personally performed the services described in this documentaiton. All medical record entries made by the scribe were at my direction and in my presence. I have reviewed the chart and agree that the record reflects my personal performance and is accurate and complete  [name], MD, 12/31/2015 11:01 AM

## 2015-12-31 NOTE — Progress Notes (Signed)
Patient resting through the night. Patient able to tolerate standing and stepping to [facility] with standby assist. Patient c/o burning with urination, and requested pyridium for symptoms-order given by midlevel. Patients NEO has been titrated down throughout the night, and patients blood pressure has remained stable with decrease in medication.

## 2016-01-01 LAB — CBC
HCT: 22.6 % — ABNORMAL LOW (ref 36.0–46.0)
Hemoglobin: 7.3 g/dL — ABNORMAL LOW (ref 12.0–15.0)
MCH: 29.7 pg (ref 26.0–34.0)
MCHC: 32.3 g/dL (ref 30.0–36.0)
MCV: 91.9 fL (ref 78.0–100.0)
Platelets: 341 10*3/uL (ref 150–400)
RBC: 2.46 MIL/uL — ABNORMAL LOW (ref 3.87–5.11)
RDW: 14.6 % (ref 11.5–15.5)
WBC: 5 10*3/uL (ref 4.0–10.5)

## 2016-01-01 LAB — TSH: TSH: 0.412 u[IU]/mL (ref 0.350–4.500)

## 2016-01-01 LAB — ABO/RH: ABO/RH(D): O POS

## 2016-01-01 LAB — PREPARE RBC (CROSSMATCH)

## 2016-01-01 LAB — BASIC METABOLIC PANEL
Anion gap: 8 (ref 5–15)
BUN: 9 mg/dL (ref 6–20)
CO2: 27 mmol/L (ref 22–32)
Calcium: 8.2 mg/dL — ABNORMAL LOW (ref 8.9–10.3)
Chloride: 106 mmol/L (ref 101–111)
Creatinine, Ser: 0.66 mg/dL (ref 0.44–1.00)
GFR calc Af Amer: >60 mL/min (ref >60)
GFR calc non Af Amer: >60 mL/min (ref >60)
Glucose, Bld: 99 mg/dL (ref 65–99)
Potassium: 3.4 mmol/L — ABNORMAL LOW (ref 3.5–5.1)
Sodium: 141 mmol/L (ref 135–145)

## 2016-01-01 MED ORDER — SODIUM CHLORIDE 0.9 % IV SOLN
Freq: Once | INTRAVENOUS | Status: AC
  Administered 2016-01-01: 13:00:00 via INTRAVENOUS

## 2016-01-01 MED ORDER — POTASSIUM CHLORIDE CRYS ER 20 MEQ PO TBCR
40.0000 meq | EXTENDED_RELEASE_TABLET | Freq: Once | ORAL | Status: AC
  Administered 2016-01-01: 40 meq via ORAL
  Filled 2016-01-01: qty 2

## 2016-01-01 NOTE — Progress Notes (Addendum)
PT'S BP CONTINUES TO DROP IN THE 80'S OVER 30'S AND 40'S. THIS IS CAUSING PT VERY MUCH ANXIETY. SHE IS AFRAID TO GO HOME IN FEAR THAT BP MAY DROP WHEN SHE IS HOME ALONE AND SHE W3ILL NOT HAVE A HELP.  ALSO HEART RATE IN THE 50'S AT NIGHT.DROPS TO 40'S WHEN ASLEEP.

## 2016-01-01 NOTE — Progress Notes (Signed)
PROGRESS NOTE    Kathleen [name]  [id number] DOB: December 22, 1960 DOA: 12/28/2015 PCP: Sallee Lange, MD Outpatient Specialists:  Dr. Deon Pilling, [facility] pulmonology   Brief Narrative:  20 yof with a hx of sarcoidosis/pulmonary fibrosis, anemia, and mitral valve prolapse presented with complaints of nausea and vomiting with associated diarrhea, left upper quadrant pain, generalized weakness, lightheadedness, decreased urine output, and intermittent substernal chest pain. She denies eating any new foods, restaurants, or sick contacts. ED workup showed her to be hypotensive and hypoxic. She was given 4L IVFs and referred for admission. She had a PICC line inserted 5/04.    Assessment & Plan:   Principal Problem:   Hypotension Active Problems:   Sarcoidosis (HCC)   CHEST PAIN   Anxiety state   Dehydration   Nausea and vomiting   Hypovolemic shock (HCC)   Acute blood loss anemia   Gastroenteritis   1. Hypovolemic shock. Improved. Suspect severe dehydration due to vomiting and diarrhea. Her blood pressure does not normally run low. Cortisol level wnl, but she is chronically on prednisone. Blood cx negative. Lactic acid 0.5. She is otherwise afebrile at this time, her breathing has improved, and does not appear septic. She has been weaned off of pressors but due to recurrent hypotension has been restarted on Phenylephrine. Will try to wean off pressors as tolerated. Continue steroids. Check TSH.  2. Gastroenteritis. Likely viral. Pt presents with persistent vomiting and diarrhea. Blood cx negative. She has not had a BM since arriving, making C. diff unlikely.  Continue supportive tx with antiemetics and stool softeners. She is tolerating a solid diet. Follow up urine cx. 3. Nausea and vomiting.  Improved. Continue Zofran PRN. 4. Chest pain. Appears atypical / pleuritic. Cardiac enzymes thus far have been negative. She does have T-wave inversions in her inferior leads. ECHO as below. CXR showed no  acute changes. Continue to monitor on telemetry.  5. Generalized weakness. Patient has been having weakness since admission. Anemia/low blood pressure is likely contributing. Will hopefully improve after transfusion. TSH is pending.  6. Bradycardia. Patient noted to be bradycardic overnight. Unfortunately no EKG on record. Review of vitals show that she is bradycardic with heart rate in the 40s over the last several nights. May have an element of sleep apnea which may be contributing. HR at this time is within normal range. Will continue to follow.  7. Hx of sarcoidosis with pulmonary fibrosis. Continue steroids and azathioprine and pulmonary hygiene. 8. Anxiety. Continue zoloft and xanax PRN. 9. Anemia, likely due to vaginal bleeding. Pt reports that she's had vaginal bleeding for the past 2 weeks with somewhat heavy flow. Her menstral cycle has been irregular. She is followed by a gynecologist in [place]. Baseline Hgb appears to be between 11-12. She was started on Megace on 5/6 and is no longer having vaginal bleeding. Repeat Hgb this morning is 7.3. Will transfuse 2U and repeat H&H. Lovenox has been d/c.   DVT prophylaxis: SCDs Code Status: Full Family Communication: Discussed with patient  Disposition Plan: Continue to monitor in stepdown. Discharge home once improved, hopefully tomorrow.   Consultants:   none  Procedures:   PICC line 5/04  ECHO 5/04 Study Conclusions  - Left ventricle: The cavity size was normal. Wall thickness was  normal. Systolic function was normal. The estimated ejection  fraction was in the range of 60% to 65%. Wall motion was normal;  there were no regional wall motion abnormalities. Left  ventricular diastolic function parameters were normal. -  Mitral valve: Mild systolic bowing without prolapse, involving  the anterior leaflet and the posterior leaflet. There was trivial  regurgitation. - Right atrium: Central venous pressure (est): 3 mm  Hg. - Atrial septum: No defect or patent foramen ovale was identified. - Tricuspid valve: There was trivial regurgitation. - Pulmonary arteries: PA peak pressure: 35 mm Hg (S). - Pericardium, extracardiac: There was no pericardial effusion.  Antimicrobials:   Rocephin 5/03 >> 5/05   Subjective: Feeling weak. Does not believe she has the strength to ambulate. Still having anxiety.   Objective: Filed Vitals:   01/01/16 0615 01/01/16 0630 01/01/16 0645 01/01/16 0700  BP: 103/44 101/47 101/46 108/48  Pulse: 47 40 39 37  Temp:      TempSrc:      Resp: 14 16 14 14   Height:      Weight:      SpO2: 95% 95% 95% 95%    Intake/Output Summary (Last 24 hours) at 01/01/16 0721 Last data filed at 01/01/16 0600  Gross per 24 hour  Intake   1590 ml  Output    600 ml  Net    990 ml   Filed Weights   12/29/15 0500 12/30/15 0500 01/01/16 0500  Weight: 94.8 kg (208 lb 15.9 oz) 98.3 kg (216 lb 11.4 oz) 96.1 kg (211 lb 13.8 oz)    Examination:  General exam: Appears calm and comfortable  Respiratory system: Scattered rhonchi. Cardiovascular system: S1 & S2 heard, RRR. No JVD, murmurs, rubs, gallops or clicks. No pedal edema. Gastrointestinal system: Abdomen is nondistended, soft and nontender. No organomegaly or masses felt. Normal bowel sounds heard. Central nervous system: Alert and oriented. No focal neurological deficits. Extremities: Symmetric 5 x 5 power. Skin: No rashes, lesions or ulcers Psychiatry: Judgement and insight appear normal. Mood & affect appropriate.   Data Reviewed: I have personally reviewed following labs and imaging studies  CBC:  Recent Labs Lab 12/28/15 0930 12/28/15 1120 12/29/15 0228 12/30/15 0440 12/31/15 0425 01/01/16 0445  WBC 9.9 8.3 4.9 7.9 6.0 5.0  NEUTROABS 8.3*  --   --   --   --   --   HGB  --  10.7* 9.0* 8.9* 7.8* 7.3*  HCT 33.9* 31.9* 26.5* 26.1* 23.3* 22.6*  MCV 87 88.4 89.5 87.9 88.6 91.9  PLT 469* 243 300 419* 331 [id number]   Basic  Metabolic Panel:  Recent Labs Lab 12/28/15 1120 12/29/15 0228 12/30/15 0440 12/31/15 0425 01/01/16 0445  NA 137 143 139 140 141  K 3.7 4.1 3.5 3.5 3.4*  CL 104 114* 110 108 106  CO2 25 22 23 26 27   GLUCOSE 101* 91 128* 138* 99  BUN 8 4* <5* 6 9  CREATININE 0.77 0.74 0.61 0.55 0.66  CALCIUM 9.0 7.2* 7.8* 8.0* 8.2*   Liver Function Tests:  Recent Labs Lab 12/28/15 0930 12/28/15 1120  AST 15 13*  ALT 6 10*  ALKPHOS 73 63  BILITOT 0.4 0.5  PROT 7.0 7.4  ALBUMIN 4.1 3.8    Recent Labs Lab 12/28/15 0930 12/28/15 1120  LIPASE 38 21    Cardiac Enzymes:  Recent Labs Lab 12/28/15 1120  TROPONINI <0.03    Urine analysis:    Component Value Date/Time   COLORURINE ORANGE* 12/31/2015 [place] 12/31/2015 1235   LABSPEC <1.005* 12/31/2015 1235   PHURINE 5.5 12/31/2015 1235   GLUCOSEU 100* 12/31/2015 1235   GLUCOSEU NEGATIVE 05/29/2010 1622   HGBUR LARGE* 12/31/2015 1235   BILIRUBINUR  NEGATIVE 12/31/2015 1235   BILIRUBINUR Positive 08/16/2015 0928   [place] 12/31/2015 1235   PROTEINUR NEGATIVE 12/31/2015 1235   PROTEINUR 30 06/15/2014 0952   UROBILINOGEN 0.2 06/16/2014 0942   NITRITE POSITIVE* 12/31/2015 1235   LEUKOCYTESUR NEGATIVE 12/31/2015 1235     Recent Results (from the past 240 hour(s))  MRSA PCR Screening     Status: None   Collection Time: 12/28/15  4:47 PM  Result Value Ref Range Status   MRSA by PCR NEGATIVE NEGATIVE Final    Comment:        The GeneXpert MRSA Assay (FDA approved for NASAL specimens only), is one component of a comprehensive MRSA colonization surveillance program. It is not intended to diagnose MRSA infection nor to guide or monitor treatment for MRSA infections.   Culture, blood (routine x 2)     Status: None (Preliminary result)   Collection Time: 12/29/15  2:28 AM  Result Value Ref Range Status   Specimen Description BLOOD LEFT ANTECUBITAL  Final   Special Requests BOTTLES DRAWN AEROBIC  AND ANAEROBIC 6CC EACH  Final   Culture NO GROWTH 3 DAYS  Final   Report Status PENDING  Incomplete  Culture, blood (routine x 2)     Status: None (Preliminary result)   Collection Time: 12/29/15  2:40 AM  Result Value Ref Range Status   Specimen Description BLOOD LEFT HAND  Final   Special Requests BOTTLES DRAWN AEROBIC ONLY 4CC  Final   Culture NO GROWTH 3 DAYS  Final   Report Status PENDING  Incomplete       Scheduled Meds: . azaTHIOprine  100 mg Oral q morning - 10a  . fentaNYL (SUBLIMAZE) injection  50 mcg Intravenous Once  . ferrous sulfate  325 mg Oral Daily  . guaiFENesin  1,200 mg Oral BID  . hydrocortisone sod succinate (SOLU-CORTEF) inj  50 mg Intravenous Q8H  . ipratropium-albuterol  3 mL Nebulization BID  . megestrol  40 mg Oral BID  . pantoprazole  40 mg Oral q morning - 10a  . phenazopyridine  100 mg Oral TID WC  . polyethylene glycol  17 g Oral Daily  . sertraline  100 mg Oral Daily  . sodium chloride flush  3 mL Intravenous Q12H   Continuous Infusions: . sodium chloride 10 mL/hr at 12/29/15 1846  . phenylephrine (NEO-SYNEPHRINE) Adult infusion 10 mcg/min (01/01/16 0600)     LOS: 4 days   Time spent: 25 minutes   [name], MD [facility] Pager [phone]  If 7PM-7AM, please contact night-coverage [email or web address] Password [email or web address] 01/01/2016, 7:21 AM   By signing my name below, I, Rosalie Doctor, attest that this documentation has been prepared under the direction and in the presence of [facility]. MD Electronically Signed: Rosalie Doctor, Scribe. 01/01/2016 9:42am  I, Dr. [name], personally performed the services described in this documentaiton. All medical record entries made by the scribe were at my direction and in my presence. I have reviewed the chart and agree that the record reflects my personal performance and is accurate and complete  [name], MD, 01/01/2016 10:22 AM

## 2016-01-01 NOTE — Progress Notes (Signed)
Heart rate this AM at shift change 0705 was 36 beats per minute. Placed back on Neo last night.

## 2016-01-01 NOTE — Plan of Care (Signed)
Problem: Physical Regulation: Goal: Ability to maintain clinical measurements within normal limits will improve Outcome: Not Progressing PT IS VERY WEAK AND FEELS UNABLE TO MEET HER OWN PERSONAL NEEDS.  Problem: Tissue Perfusion: Goal: Risk factors for ineffective tissue perfusion will decrease Outcome: Not Progressing PT CONTINUES TO HAVE FREQUENT LOW BP'S

## 2016-01-02 DIAGNOSIS — N939 Abnormal uterine and vaginal bleeding, unspecified: Secondary | ICD-10-CM | POA: Diagnosis present

## 2016-01-02 LAB — TYPE AND SCREEN
ABO/RH(D): O POS
Antibody Screen: NEGATIVE
Unit division: 0
Unit division: 0

## 2016-01-02 LAB — CBC
HCT: 31.1 % — ABNORMAL LOW (ref 36.0–46.0)
Hemoglobin: 10.4 g/dL — ABNORMAL LOW (ref 12.0–15.0)
MCH: 30 pg (ref 26.0–34.0)
MCHC: 33.4 g/dL (ref 30.0–36.0)
MCV: 89.6 fL (ref 78.0–100.0)
Platelets: 303 10*3/uL (ref 150–400)
RBC: 3.47 MIL/uL — ABNORMAL LOW (ref 3.87–5.11)
RDW: 15.3 % (ref 11.5–15.5)
WBC: 6.1 10*3/uL (ref 4.0–10.5)

## 2016-01-02 LAB — BASIC METABOLIC PANEL
Anion gap: 6 (ref 5–15)
BUN: 13 mg/dL (ref 6–20)
CO2: 26 mmol/L (ref 22–32)
Calcium: 8 mg/dL — ABNORMAL LOW (ref 8.9–10.3)
Chloride: 106 mmol/L (ref 101–111)
Creatinine, Ser: 0.67 mg/dL (ref 0.44–1.00)
GFR calc Af Amer: >60 mL/min (ref >60)
GFR calc non Af Amer: >60 mL/min (ref >60)
Glucose, Bld: 102 mg/dL — ABNORMAL HIGH (ref 65–99)
Potassium: 3.8 mmol/L (ref 3.5–5.1)
Sodium: 138 mmol/L (ref 135–145)

## 2016-01-02 MED ORDER — SPHYGMOMANOMETER MISC: Status: DC

## 2016-01-02 MED ORDER — PREDNISONE 20 MG PO TABS
40.0000 mg | ORAL_TABLET | Freq: Every day | ORAL | Status: DC
  Administered 2016-01-02: 40 mg via ORAL
  Filled 2016-01-02: qty 2

## 2016-01-02 MED ORDER — MEGESTROL ACETATE 40 MG PO TABS: 40.0000 mg | ORAL_TABLET | Freq: Two times a day (BID) | ORAL | Status: DC

## 2016-01-02 MED ORDER — PREDNISONE 10 MG PO TABS: ORAL_TABLET | ORAL | Status: DC

## 2016-01-02 NOTE — Discharge Summary (Signed)
Physician Discharge Summary  Kathleen [name] [id number] DOB: 06/12/1961 DOA: 12/28/2015  PCP: Sallee Lange, MD  Admit date: 12/28/2015 Discharge date: 01/02/2016  Time spent: 35 minutes  Recommendations for Outpatient Follow-up:  1. Follow up with PCP within 1-2 week 2. Follow up with outpatient OBGYN to discuss vaginal bleeding.  3. Consider outpatient sleep study to evaluated for nocturnal bradycardia.  4. Consider outpatient cardiology evaluation, if chest pain recurs.  5. Discharge with home health PT  Discharge Diagnoses:  Principal Problem:   Hypotension Active Problems:   Sarcoidosis (HCC)   CHEST PAIN   Anxiety state   Dehydration   Nausea and vomiting   Hypovolemic shock (HCC)   Acute blood loss anemia   Gastroenteritis   Discharge Condition: Improved  Diet recommendation: Heart healthy   Filed Weights   12/30/15 0500 01/01/16 0500 01/02/16 0500  Weight: 98.3 kg (216 lb 11.4 oz) 96.1 kg (211 lb 13.8 oz) 100.2 kg (220 lb 14.4 oz)    History of present illness:  42 yof with a hx of sarcoidosis/pulmonary fibrosis, anemia, and mitral valve prolapse presented with complaints of nausea and vomiting with associated diarrhea, left upper quadrant pain, generalized weakness, lightheadedness, decreased urine output, and intermittent substernal chest pain. She denies eating any new foods, restaurants, or sick contacts. ED workup showed her to be hypotensive and hypoxic. She was given 4L IVFs and referred for admission. She had a PICC line inserted 5/04.   Hospital Course:  Patient was admitted for hypovolemic shock. Suspect severe dehydration due to vomiting and diarrhea. Patient is also chronically on prednisone, so may have had an episode of adrenal insuffiencey. She was aggressively hydrated with IVF and started on stress dose steroids. She briefly required vasopressor support, but has since been weaned off. Work up otherwise has been unremarkable. Steroids have been changed to  prednisone taper. She was also noted to be significantly anemic, related to vaginal bleeding. After transfusion of PRBCs her blood pressure has stabilized. TSH and cortisol were in normal limits.   1. Gastroenteritis. Likely viral. Pt presents with persistent vomiting and diarrhea. Blood cx negative. She has not had a BM since arriving, making C. diff unlikely. Continue supportive tx with antiemetics and stool softeners. She is tolerating a solid diet. .  2. Nausea and vomiting. Resolved with Zofran PRN. 3. Chest pain. Appears atypical / pleuritic. Cardiac enzymes thus far have been negative. She does have T-wave inversions in her inferior leads. ECHO as below. CXR showed no acute changes. Can consider outpatient cardiology evaluation if symptoms persist.  4. Generalized weakness. Patient has been having weakness since admission. Anemia/low blood pressure is likely contributing. Improved after transfusion. PT evaluated and recommended HHPT.  5. Bradycardia. Patient noted to have nocturnal bradycardia during her hospital stay. While awake HR noted to be in normal range. TSH found to be normal. Since she complains of chronic fatigue, she may benefit from outpatient sleep study. Will defer to PCP. 6. Hx of sarcoidosis with pulmonary fibrosis. Continue steroids and azathioprine and pulmonary hygiene. 7. Anxiety. Continue zoloft and xanax PRN. 8. Anemia, likely due to vaginal bleeding. Pt reports that she's had vaginal bleeding for the past 2 weeks with somewhat heavy flow. Her menstral cycle has been irregular. She is followed by a gynecologist in [place]. Baseline Hgb appears to be between 11-12. She was started on Megace on 5/6 and is no longer having vaginal bleeding. Hgb improved s/p transfusion 2u PRBC.  Procedures:  2U PRBC 5/6  PICC line 5/04  ECHO 5/04  Study Conclusions  - Left ventricle: The cavity size was normal. Wall thickness was  normal. Systolic function was normal. The  estimated ejection  fraction was in the range of 60% to 65%. Wall motion was normal;  there were no regional wall motion abnormalities. Left  ventricular diastolic function parameters were normal. - Mitral valve: Mild systolic bowing without prolapse, involving  the anterior leaflet and the posterior leaflet. There was trivial  regurgitation. - Right atrium: Central venous pressure (est): 3 mm Hg. - Atrial septum: No defect or patent foramen ovale was identified. - Tricuspid valve: There was trivial regurgitation. - Pulmonary arteries: PA peak pressure: 35 mm Hg (S). - Pericardium, extracardiac: There was no pericardial effusion.  Consultations:  None  Discharge Exam: Filed Vitals:   01/02/16 0919 01/02/16 0920  BP: 109/56 128/65  Pulse: 71 71  Temp:    Resp:      General: NAD, looks comfortable Cardiovascular: RRR, S1, S2  Respiratory: clear bilaterally, No wheezing, rales or rhonchi Abdomen: soft, non tender, no distention , bowel sounds normal Musculoskeletal: No edema b/l  Discharge Instructions    Current Discharge Medication List    CONTINUE these medications which have NOT CHANGED   Details  ALPRAZolam (XANAX) 1 MG tablet Take 1 tablet (1 mg total) by mouth 3 (three) times daily. Qty: 90 tablet, Refills: 2    azaTHIOprine (IMURAN) 50 MG tablet Take 100 mg by mouth every morning.     budesonide (PULMICORT) 0.5 MG/2ML nebulizer solution Take 0.5 mg by nebulization daily as needed (for shortness of breath).     EPINEPHrine 0.3 mg/0.3 mL IJ SOAJ injection Inject 0.3 mLs (0.3 mg total) into the muscle once. Qty: 1 Device, Refills: 5    ferrous sulfate 325 (65 FE) MG EC tablet Take 325 mg by mouth daily.    oxyCODONE (OXY IR/ROXICODONE) 5 MG immediate release tablet Take 1 tablet (5 mg total) by mouth 4 (four) times daily as needed for severe pain. Qty: 120 tablet, Refills: 0    pantoprazole (PROTONIX) 40 MG tablet Take 40 mg by mouth every morning.     predniSONE (DELTASONE) 5 MG tablet Take 5 mg by mouth daily with breakfast.  Refills: 3    sertraline (ZOLOFT) 100 MG tablet Take 1 tablet (100 mg total) by mouth daily. Qty: 30 tablet, Refills: 2    Linaclotide (LINZESS) 145 MCG CAPS capsule Take 1 capsule (145 mcg total) by mouth daily. Qty: 90 capsule, Refills: 1    ondansetron (ZOFRAN) 8 MG tablet Take 1 tablet (8 mg total) by mouth every 8 (eight) hours as needed for nausea. Qty: 20 tablet, Refills: 0       Allergies  Allergen Reactions  . Bee Venom Anaphylaxis  . Benadryl [Diphenhydramine Hcl] Nausea Only and Swelling    "makes my throat close up" "makes my throat close up"  . Codeine Itching    Other reaction(s): ITCHING  . Diphenhydramine Nausea Only    "makes my throat close up" "makes my throat close up"  . Morphine And Related Other (See Comments)    Unknown reaction- pt gets sick, dizzy, and confused Intolerance, not an allergy, received this in the emergency department recently without a problem.      The results of significant diagnostics from this hospitalization (including imaging, microbiology, ancillary and laboratory) are listed below for reference.    Significant Diagnostic Studies: Dg Chest Port 1 View  12/29/2015  CLINICAL DATA:  Pt c/o SOB this afternoon. Hx nonsmoker, sarcoidosis, mitral valve prolapse, pulmonary fibrosis, heart disease EXAM: PORTABLE CHEST 1 VIEW COMPARISON:  12/28/2015 FINDINGS: Upper lobe predominant interstitial thickening and irregularity is similar to prior studies consistent with interstitial lung disease, reportedly sarcoidosis. No convincing pneumonia or other acute lung abnormality. No pleural effusion.  No pneumothorax. Cardiac silhouette is top-normal in size. No mediastinal or hilar masses. Bony thorax is grossly intact. IMPRESSION: 1. No convincing acute cardiopulmonary disease. 2. Interstitial lung disease similar to prior studies. Electronically Signed   By: Lajean Manes  M.D.   On: 12/29/2015 18:44   Dg Chest Port 1 View  12/28/2015  CLINICAL DATA:  Chest pain, cough, history of pulmonary fibrosis EXAM: PORTABLE CHEST 1 VIEW COMPARISON:  10/20/2015 FINDINGS: Cardiomediastinal silhouette is stable. Again noted chronic interstitial prominence and fibrotic changes especially in upper lobes. Slight worsening mild perihilar interstitial prominence. Mild superimposed interstitial edema cannot be excluded. Mild basilar atelectasis. IMPRESSION: Again noted chronic interstitial prominence and fibrotic changes especially in upper lobes. Slight worsening mild perihilar interstitial prominence. Mild superimposed interstitial edema cannot be excluded. Mild basilar atelectasis. Electronically Signed   By: Lahoma Crocker M.D.   On: 12/28/2015 14:10    Microbiology: Recent Results (from the past 240 hour(s))  MRSA PCR Screening     Status: None   Collection Time: 12/28/15  4:47 PM  Result Value Ref Range Status   MRSA by PCR NEGATIVE NEGATIVE Final    Comment:        The GeneXpert MRSA Assay (FDA approved for NASAL specimens only), is one component of a comprehensive MRSA colonization surveillance program. It is not intended to diagnose MRSA infection nor to guide or monitor treatment for MRSA infections.   Culture, blood (routine x 2)     Status: None (Preliminary result)   Collection Time: 12/29/15  2:28 AM  Result Value Ref Range Status   Specimen Description BLOOD LEFT ANTECUBITAL  Final   Special Requests BOTTLES DRAWN AEROBIC AND ANAEROBIC 6CC EACH  Final   Culture NO GROWTH 3 DAYS  Final   Report Status PENDING  Incomplete  Culture, blood (routine x 2)     Status: None (Preliminary result)   Collection Time: 12/29/15  2:40 AM  Result Value Ref Range Status   Specimen Description BLOOD LEFT HAND  Final   Special Requests BOTTLES DRAWN AEROBIC ONLY 4CC  Final   Culture NO GROWTH 3 DAYS  Final   Report Status PENDING  Incomplete     Labs: Basic Metabolic  Panel:  Recent Labs Lab 12/29/15 0228 12/30/15 0440 12/31/15 0425 01/01/16 0445 01/02/16 0445  NA 143 139 140 141 138  K 4.1 3.5 3.5 3.4* 3.8  CL 114* 110 108 106 106  CO2 22 23 26 27 26   GLUCOSE 91 128* 138* 99 102*  BUN 4* <5* 6 9 13   CREATININE 0.74 0.61 0.55 0.66 0.67  CALCIUM 7.2* 7.8* 8.0* 8.2* 8.0*   Liver Function Tests:  Recent Labs Lab 12/28/15 0930 12/28/15 1120  AST 15 13*  ALT 6 10*  ALKPHOS 73 63  BILITOT 0.4 0.5  PROT 7.0 7.4  ALBUMIN 4.1 3.8    Recent Labs Lab 12/28/15 0930 12/28/15 1120  LIPASE 38 21   No results for input(s): AMMONIA in the last 168 hours. CBC:  Recent Labs Lab 12/28/15 0930  12/29/15 0228 12/30/15 0440 12/31/15 0425 01/01/16 0445 01/02/16 0445  WBC 9.9  < > 4.9 7.9 6.0 5.0 6.1  NEUTROABS 8.3*  --   --   --   --   --   --   HGB  --   < > 9.0* 8.9* 7.8* 7.3* 10.4*  HCT 33.9*  < > 26.5* 26.1* 23.3* 22.6* 31.1*  MCV 87  < > 89.5 87.9 88.6 91.9 89.6  PLT 469*  < > 300 419* 331 341 303  < > = values in this interval not displayed. Cardiac Enzymes:  Recent Labs Lab 12/28/15 1120  TROPONINI <0.03     Signed:  [name], MD  [facility] 01/02/2016, 9:50 AM   By signing my name below, I, Rennis Harding, attest that this documentation has been prepared under the direction and in the presence of [name], MD. Electronically signed: Rennis Harding, Scribe. 01/02/2016 9:45am   I, Dr. [name], personally performed the services described in this documentaiton. All medical record entries made by the scribe were at my direction and in my presence. I have reviewed the chart and agree that the record reflects my personal performance and is accurate and complete  [name], MD, 01/02/2016 4:12 PM

## 2016-01-02 NOTE — Care Management Note (Signed)
Case Management Note  Patient Details  Name: Kathleen [name] MRN: [id number] Date of Birth: 06/07/1961   Expected Discharge Date:    01/02/2016              Expected Discharge Plan:  [place]  In-House Referral:  NA  Discharge planning Services  NA, CM Consult  Post Acute Care Choice:  Home Health Choice offered to:  Patient  DME Arranged:    DME Agency:     HH Arranged:  RN, PT [place] Agency:  [place]  Status of Service:  Completed, signed off  Medicare Important Message Given:  Yes Date Medicare IM Given:    Medicare IM give by:    Date Additional Medicare IM Given:    Additional Medicare Important Message give by:     If discussed at [place] of Stay Meetings, dates discussed:    Additional Comments: Pt discharging home today with [place] [facility]. PT has chosen [facility] from [facility] agency list. Pt is aware HH has 48 hours to make first visit. Romualdo Bolk, of [facility], made aware of referral and will obtain pt info from chart. Pt has no DME needs.  Sherald Barge, RN 01/02/2016, 4:09 PM

## 2016-01-02 NOTE — Evaluation (Signed)
Physical Therapy Evaluation Patient Details Name: Kathleen [name] MRN: [id number] DOB: 08/01/1961 Today's Date: 01/02/2016   History of Present Illness  55 yo F admitted with N/V and found to have hypovolemic shock due to viral gastroenteritis, and UTI.  PMH includes: sarcoidosis, pulmonary fibrosis, mitral valve prolapse.  H&H today: 10.4/31.1.  Clinical Impression  Pt received in bed, and was agreeable to PT evaluation despite wanting to take a nap.  Pt expressed that she normally ambulates with a rollator at home, but is independent with dressing, and occasionally requires A for tub transfers.  Pt demonstrated all transfers including bed mobility and sit<>stand transfers at Mod (I) level, and ambulate 3ft with RW and supervision.  Pt was limited today due to fatigue, and expressed that she was completely exhausted after PT evaluation today.  Will recommend HHPT to continue to optimize pt's endurance.  Pt states that she was supposed to be evaluated at outpatient pulmonary rehab, but does not feel she would be strong enough to go to the appointment.     Follow Up Recommendations Home health PT    Equipment Recommendations  None recommended by PT    Recommendations for Other Services       Precautions / Restrictions Precautions Precautions: None Restrictions Weight Bearing Restrictions: No      Mobility  Bed Mobility Overal bed mobility: Modified Independent                Transfers Overall transfer level: Modified independent                  Ambulation/Gait Ambulation/Gait assistance: Supervision Ambulation Distance (Feet): 80 Feet Assistive device: Rolling walker (2 wheeled) Gait Pattern/deviations: Step-through pattern     General Gait Details: decreased cadence.  Limited due to fatigue.  Pt stated that she felt completely worn out after that.   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 She is able to ambulate in community, and run errands/groceries.   ADL's / Homemaking Assistance Needed: Pt is independent with dressing.  She occasionally requires assistance for tub  transfers.         Hand Dominance        Extremity/Trunk Assessment   Upper Extremity Assessment: Overall WFL for tasks assessed           Lower Extremity Assessment: Overall WFL for tasks assessed         Communication  Communication: No difficulties  Cognition Arousal/Alertness: Awake/alert Behavior During Therapy: WFL for tasks assessed/performed Overall Cognitive Status: Within Functional Limits for tasks assessed                      General Comments      Exercises        Assessment/Plan    PT Assessment Patient needs continued PT services  PT Diagnosis Difficulty walking;Generalized weakness   PT Problem List Decreased strength;Decreased activity tolerance;Decreased balance;Decreased mobility;Cardiopulmonary status limiting activity;Decreased knowledge of precautions  PT Treatment Interventions  Gait training;Stair training;Functional mobility training;Therapeutic activities;Therapeutic exercise;Balance training;Patient/family education   PT Goals (Current goals can be found in the Care Plan section) Acute Rehab PT Goals Patient Stated Goal: Pt expressed she wants to get stronger and move better.  PT Goal Formulation: With patient Time For Goal Achievement: 01/09/16 Potential to Achieve Goals: Good    Frequency Min 2X/week   Barriers to discharge        Co-evaluation               End of Session Equipment Utilized During Treatment: Gait belt Activity Tolerance: Patient limited by fatigue Patient left: in bed;with call bell/phone within reach Nurse Communication: Mobility status    Functional Assessment Tool Used: [employer] "6-clicks" Functional Limitation: Mobility: Walking and moving around Mobility: Walking and Moving Around Current Status [phone]): At least 20 percent but less than 40 percent impaired, limited or restricted Mobility: Walking and Moving Around Goal Status [phone]): At least 1 percent but less than 20 percent impaired, limited or restricted    Time: 0857-0915 PT Time Calculation (min) (ACUTE ONLY): 18 min   Charges:   PT Evaluation $PT Eval Low Complexity: 1 Procedure     PT G Codes:   PT G-Codes **NOT FOR INPATIENT CLASS** Functional Assessment Tool Used: [employer] "6-clicks" Functional Limitation: Mobility: Walking and moving around Mobility: Walking and Moving Around Current Status [phone]): At least 20 percent but less than 40 percent impaired, limited or restricted Mobility: Walking and Moving Around Goal Status [phone]): At least 1 percent but less than 20 percent impaired, limited or restricted    Tacy Learn, PT, DPT X: [id number]   01/02/2016, 9:31 AM

## 2016-01-02 NOTE — Care Management Important Message (Signed)
Important Message  Patient Details  Name: Kathleen [name] MRN: [id number] Date of Birth: Oct 09, 1960   Medicare Important Message Given:  Yes    Sherald Barge, RN 01/02/2016, 4:07 PM

## 2016-01-02 NOTE — Progress Notes (Signed)
Reviewed discharge instruction patient. Discuss with patient to monitor BP at home and write down BP reading to maintain a record.Informed patient to take BP recording to her next MD appointment.

## 2016-01-03 LAB — CULTURE, BLOOD (ROUTINE X 2)
Culture: NO GROWTH
Culture: NO GROWTH

## 2016-01-03 MED FILL — MEGESTROL 40 MG TABLET: 40 | 7 days supply | Qty: 14 | Fill #0

## 2016-01-03 MED FILL — predniSONE 10 MG TABS: 10 | 4 days supply | Qty: 10 | Fill #0

## 2016-01-05 DIAGNOSIS — K529 Noninfective gastroenteritis and colitis, unspecified: Secondary | ICD-10-CM | POA: Diagnosis not present

## 2016-01-05 DIAGNOSIS — D860 Sarcoidosis of lung: Secondary | ICD-10-CM | POA: Diagnosis not present

## 2016-01-05 DIAGNOSIS — I959 Hypotension, unspecified: Secondary | ICD-10-CM | POA: Diagnosis not present

## 2016-01-09 DIAGNOSIS — I959 Hypotension, unspecified: Secondary | ICD-10-CM | POA: Diagnosis not present

## 2016-01-09 DIAGNOSIS — D860 Sarcoidosis of lung: Secondary | ICD-10-CM | POA: Diagnosis not present

## 2016-01-09 DIAGNOSIS — K529 Noninfective gastroenteritis and colitis, unspecified: Secondary | ICD-10-CM | POA: Diagnosis not present

## 2016-01-10 ENCOUNTER — Ambulatory Visit (HOSPITAL_COMMUNITY): Payer: 59

## 2016-01-11 DIAGNOSIS — I959 Hypotension, unspecified: Secondary | ICD-10-CM | POA: Diagnosis not present

## 2016-01-11 DIAGNOSIS — D860 Sarcoidosis of lung: Secondary | ICD-10-CM | POA: Diagnosis not present

## 2016-01-11 DIAGNOSIS — K529 Noninfective gastroenteritis and colitis, unspecified: Secondary | ICD-10-CM | POA: Diagnosis not present

## 2016-01-12 DIAGNOSIS — K529 Noninfective gastroenteritis and colitis, unspecified: Secondary | ICD-10-CM | POA: Diagnosis not present

## 2016-01-12 DIAGNOSIS — I959 Hypotension, unspecified: Secondary | ICD-10-CM | POA: Diagnosis not present

## 2016-01-12 DIAGNOSIS — D860 Sarcoidosis of lung: Secondary | ICD-10-CM | POA: Diagnosis not present

## 2016-01-13 ENCOUNTER — Ambulatory Visit (INDEPENDENT_AMBULATORY_CARE_PROVIDER_SITE_OTHER): Payer: 59 | Admitting: Family Medicine

## 2016-01-13 ENCOUNTER — Encounter: Payer: Self-pay | Admitting: Family Medicine

## 2016-01-13 VITALS — BP 120/72 | Ht 68.0 in | Wt 207.1 lb

## 2016-01-13 DIAGNOSIS — K529 Noninfective gastroenteritis and colitis, unspecified: Secondary | ICD-10-CM | POA: Diagnosis not present

## 2016-01-13 DIAGNOSIS — E860 Dehydration: Secondary | ICD-10-CM

## 2016-01-13 DIAGNOSIS — I959 Hypotension, unspecified: Secondary | ICD-10-CM | POA: Diagnosis not present

## 2016-01-13 DIAGNOSIS — D860 Sarcoidosis of lung: Secondary | ICD-10-CM | POA: Diagnosis not present

## 2016-01-13 DIAGNOSIS — D509 Iron deficiency anemia, unspecified: Secondary | ICD-10-CM | POA: Diagnosis not present

## 2016-01-13 DIAGNOSIS — J8410 Pulmonary fibrosis, unspecified: Secondary | ICD-10-CM | POA: Diagnosis not present

## 2016-01-13 NOTE — Progress Notes (Signed)
   Subjective:    Patient ID: Kathleen [name], female    DOB: 11-Sep-1960, 55 y.o.   MRN: [id number]  HPI Patient is here today for a hospital follow up visit. Patient was treated at [facility] on 12/28/15 for gastroenteritis. Patient states that she is still very weak.  This patient was initially seen a couple weeks ago with gastroenteritis then shortly thereafter ended up in the hospital with severe dehydration and anemia and was given transfusion along with IV fluids she had swelling in her legs felt horrible for multiple days and finally went home there was no reason for her blood loss given in the record. She is up-to-date on colonoscopy. She does state that she has significant fatigue tiredness feeling very weak she denies feeling woozy. No dizziness or passing out. No fevers. She does have cough and shortness of breath related to her interstitial lung disease Patient states that she has no other concerns at this time.    Review of Systems  Constitutional: Positive for fatigue. Negative for fever.  HENT: Negative for congestion.   Respiratory: Positive for cough and shortness of breath.   Cardiovascular: Negative for chest pain.  Gastrointestinal: Negative for nausea, abdominal pain and diarrhea.  Genitourinary: Negative for dysuria.       Objective:   Physical Exam  Constitutional: She appears well-nourished. No distress.  Cardiovascular: Normal rate, regular rhythm and normal heart sounds.   No murmur heard. Pulmonary/Chest: Effort normal and breath sounds normal. No respiratory distress.  Musculoskeletal: She exhibits no edema.  Lymphadenopathy:    She has no cervical adenopathy.  Neurological: She is alert. She exhibits normal muscle tone.  Psychiatric: Her behavior is normal.  Vitals reviewed.   25 minutes was spent with the patient. Greater than half the time was spent in discussion and answering questions and counseling regarding the issues that the patient came in for  today. Greater than half the time was spent reviewing with the patient what went on while she was in the hospital plus also to see how she was doing since then and counseling her how to improve her energy and feel better  Blood pressure taken sitting and standing no significant change    Assessment & Plan:  Recent hospitalization Report patient was anemic enough to get transfusion she is on medication Imuran which would potentially cause bone marrow suppression but also is possible she may have had a GI bleed with her being on prednisone I recommend this patient have further evaluation with Hemoccult cards plus also CBC TIBC ferritin to evaluate for the possibility of iron deficient anemia, GI blood loss, versus bone marrow suppression may need referral to hematology.  Recent fatigue tiredness I think this is related to her long hospitalization for dehydration I do believe it is wise to check metabolic 7 to rule out electrolyte abnormalities  It is possible that this patient may end up needing referral to hematology for iron infusion or further workup

## 2016-01-14 LAB — CBC WITH DIFFERENTIAL/PLATELET
Basophils Absolute: 0 10*3/uL (ref 0.0–0.2)
Basos: 1 %
EOS (ABSOLUTE): 0.2 10*3/uL (ref 0.0–0.4)
Eos: 2 %
Hematocrit: 40.2 % (ref 34.0–46.6)
Hemoglobin: 13.3 g/dL (ref 11.1–15.9)
Immature Grans (Abs): 0.1 10*3/uL (ref 0.0–0.1)
Immature Granulocytes: 1 %
Lymphocytes Absolute: 1.1 10*3/uL (ref 0.7–3.1)
Lymphs: 15 %
MCH: 29.2 pg (ref 26.6–33.0)
MCHC: 33.1 g/dL (ref 31.5–35.7)
MCV: 88 fL (ref 79–97)
Monocytes Absolute: 0.2 10*3/uL (ref 0.1–0.9)
Monocytes: 3 %
Neutrophils Absolute: 5.9 10*3/uL (ref 1.4–7.0)
Neutrophils: 78 %
Platelets: 397 10*3/uL — ABNORMAL HIGH (ref 150–379)
RBC: 4.56 x10E6/uL (ref 3.77–5.28)
RDW: 14.4 % (ref 12.3–15.4)
WBC: 7.5 10*3/uL (ref 3.4–10.8)

## 2016-01-14 LAB — BASIC METABOLIC PANEL
BUN/Creatinine Ratio: 11 (ref 9–23)
BUN: 9 mg/dL (ref 6–24)
CO2: 21 mmol/L (ref 18–29)
Calcium: 9.2 mg/dL (ref 8.7–10.2)
Chloride: 103 mmol/L (ref 96–106)
Creatinine, Ser: 0.81 mg/dL (ref 0.57–1.00)
GFR calc Af Amer: 96 mL/min/{1.73_m2} (ref >59)
GFR calc non Af Amer: 83 mL/min/{1.73_m2} (ref >59)
Glucose: 85 mg/dL (ref 65–99)
Potassium: 4.5 mmol/L (ref 3.5–5.2)
Sodium: 140 mmol/L (ref 134–144)

## 2016-01-14 LAB — IRON AND TIBC
Iron Saturation: 20 % (ref 15–55)
Iron: 58 ug/dL (ref 27–159)
Total Iron Binding Capacity: 286 ug/dL (ref 250–450)
UIBC: 228 ug/dL (ref 131–425)

## 2016-01-14 LAB — FERRITIN: Ferritin: 109 ng/mL (ref 15–150)

## 2016-01-16 DIAGNOSIS — K529 Noninfective gastroenteritis and colitis, unspecified: Secondary | ICD-10-CM | POA: Diagnosis not present

## 2016-01-16 DIAGNOSIS — D860 Sarcoidosis of lung: Secondary | ICD-10-CM | POA: Diagnosis not present

## 2016-01-16 DIAGNOSIS — I959 Hypotension, unspecified: Secondary | ICD-10-CM | POA: Diagnosis not present

## 2016-01-17 MED FILL — oxyCODONE HCL 5 MG TABS: 5 | 30 days supply | Qty: 120 | Fill #0

## 2016-01-21 DIAGNOSIS — I959 Hypotension, unspecified: Secondary | ICD-10-CM | POA: Diagnosis not present

## 2016-01-21 DIAGNOSIS — K529 Noninfective gastroenteritis and colitis, unspecified: Secondary | ICD-10-CM | POA: Diagnosis not present

## 2016-01-21 DIAGNOSIS — D860 Sarcoidosis of lung: Secondary | ICD-10-CM | POA: Diagnosis not present

## 2016-01-23 DIAGNOSIS — I959 Hypotension, unspecified: Secondary | ICD-10-CM | POA: Diagnosis not present

## 2016-01-23 DIAGNOSIS — D860 Sarcoidosis of lung: Secondary | ICD-10-CM | POA: Diagnosis not present

## 2016-01-23 DIAGNOSIS — K529 Noninfective gastroenteritis and colitis, unspecified: Secondary | ICD-10-CM | POA: Diagnosis not present

## 2016-01-25 ENCOUNTER — Telehealth: Payer: Self-pay | Admitting: Family Medicine

## 2016-01-25 DIAGNOSIS — K529 Noninfective gastroenteritis and colitis, unspecified: Secondary | ICD-10-CM | POA: Diagnosis not present

## 2016-01-25 DIAGNOSIS — I959 Hypotension, unspecified: Secondary | ICD-10-CM | POA: Diagnosis not present

## 2016-01-25 DIAGNOSIS — D860 Sarcoidosis of lung: Secondary | ICD-10-CM | POA: Diagnosis not present

## 2016-01-25 DIAGNOSIS — D869 Sarcoidosis, unspecified: Secondary | ICD-10-CM

## 2016-01-25 NOTE — Telephone Encounter (Signed)
Please give referral for medical social worker

## 2016-01-25 NOTE — Telephone Encounter (Signed)
Di Kindle at [facility] is wanting to know if the medical social worker can do a visit and see if there are any resources available to help Jonesha?

## 2016-01-26 NOTE — Telephone Encounter (Signed)
Referral was put into epic for a medical social worker. Di Kindle at [facility] was notified and verbalized understanding.

## 2016-01-27 ENCOUNTER — Encounter (HOSPITAL_COMMUNITY): Payer: Self-pay | Admitting: Psychiatry

## 2016-01-27 ENCOUNTER — Telehealth (HOSPITAL_COMMUNITY): Payer: Self-pay | Admitting: *Deleted

## 2016-01-31 DIAGNOSIS — D860 Sarcoidosis of lung: Secondary | ICD-10-CM | POA: Diagnosis not present

## 2016-01-31 DIAGNOSIS — K529 Noninfective gastroenteritis and colitis, unspecified: Secondary | ICD-10-CM | POA: Diagnosis not present

## 2016-01-31 DIAGNOSIS — I959 Hypotension, unspecified: Secondary | ICD-10-CM | POA: Diagnosis not present

## 2016-02-01 ENCOUNTER — Encounter (HOSPITAL_COMMUNITY): Payer: Self-pay | Admitting: Emergency Medicine

## 2016-02-01 ENCOUNTER — Emergency Department (HOSPITAL_COMMUNITY): Payer: 59

## 2016-02-01 ENCOUNTER — Telehealth: Payer: Self-pay | Admitting: Family Medicine

## 2016-02-01 ENCOUNTER — Emergency Department (HOSPITAL_COMMUNITY)
Admission: EM | Admit: 2016-02-01 | Discharge: 2016-02-01 | Disposition: A | Discharge status: Home / Self Care | Payer: 59 | Attending: Emergency Medicine | Admitting: Emergency Medicine

## 2016-02-01 DIAGNOSIS — R112 Nausea with vomiting, unspecified: Secondary | ICD-10-CM | POA: Insufficient documentation

## 2016-02-01 DIAGNOSIS — R34 Anuria and oliguria: Secondary | ICD-10-CM | POA: Insufficient documentation

## 2016-02-01 DIAGNOSIS — J8410 Pulmonary fibrosis, unspecified: Secondary | ICD-10-CM | POA: Diagnosis not present

## 2016-02-01 DIAGNOSIS — I959 Hypotension, unspecified: Secondary | ICD-10-CM | POA: Diagnosis not present

## 2016-02-01 DIAGNOSIS — K529 Noninfective gastroenteritis and colitis, unspecified: Secondary | ICD-10-CM | POA: Diagnosis not present

## 2016-02-01 DIAGNOSIS — D860 Sarcoidosis of lung: Secondary | ICD-10-CM | POA: Diagnosis not present

## 2016-02-01 LAB — CBC
HCT: 37.2 % (ref 36.0–46.0)
Hemoglobin: 12.3 g/dL (ref 12.0–15.0)
MCH: 28.9 pg (ref 26.0–34.0)
MCHC: 33.1 g/dL (ref 30.0–36.0)
MCV: 87.5 fL (ref 78.0–100.0)
Platelets: 323 10*3/uL (ref 150–400)
RBC: 4.25 MIL/uL (ref 3.87–5.11)
RDW: 14.7 % (ref 11.5–15.5)
WBC: 6.4 10*3/uL (ref 4.0–10.5)

## 2016-02-01 LAB — COMPREHENSIVE METABOLIC PANEL
ALT: 12 U/L — ABNORMAL LOW (ref 14–54)
AST: 15 U/L (ref 15–41)
Albumin: 3.9 g/dL (ref 3.5–5.0)
Alkaline Phosphatase: 56 U/L (ref 38–126)
Anion gap: 6 (ref 5–15)
BUN: 9 mg/dL (ref 6–20)
CO2: 24 mmol/L (ref 22–32)
Calcium: 8.6 mg/dL — ABNORMAL LOW (ref 8.9–10.3)
Chloride: 105 mmol/L (ref 101–111)
Creatinine, Ser: 0.7 mg/dL (ref 0.44–1.00)
GFR calc Af Amer: >60 mL/min (ref >60)
GFR calc non Af Amer: >60 mL/min (ref >60)
Glucose, Bld: 88 mg/dL (ref 65–99)
Potassium: 3.6 mmol/L (ref 3.5–5.1)
Sodium: 135 mmol/L (ref 135–145)
Total Bilirubin: 0.4 mg/dL (ref 0.3–1.2)
Total Protein: 7.3 g/dL (ref 6.5–8.1)

## 2016-02-01 LAB — URINALYSIS, ROUTINE W REFLEX MICROSCOPIC
Bilirubin Urine: NEGATIVE
Glucose, UA: NEGATIVE mg/dL
Hgb urine dipstick: NEGATIVE
Ketones, ur: NEGATIVE mg/dL
Leukocytes, UA: NEGATIVE
Nitrite: NEGATIVE
Protein, ur: NEGATIVE mg/dL
Specific Gravity, Urine: 1.025 (ref 1.005–1.030)
pH: 5.5 (ref 5.0–8.0)

## 2016-02-01 LAB — LIPASE, BLOOD: Lipase: 31 U/L (ref 11–51)

## 2016-02-01 MED ORDER — METOCLOPRAMIDE HCL 5 MG/ML IJ SOLN
10.0000 mg | Freq: Once | INTRAMUSCULAR | Status: AC
  Administered 2016-02-01: 10 mg via INTRAVENOUS
  Filled 2016-02-01: qty 2

## 2016-02-01 MED ORDER — ONDANSETRON 4 MG PO TBDP: 4.0000 mg | ORAL_TABLET | Freq: Once | ORAL | Status: DC | PRN

## 2016-02-01 MED ORDER — SODIUM CHLORIDE 0.9 % IV BOLUS (SEPSIS)
1000.0000 mL | Freq: Once | INTRAVENOUS | Status: AC
  Administered 2016-02-01: 1000 mL via INTRAVENOUS

## 2016-02-01 MED ORDER — PROMETHAZINE HCL 25 MG PO TABS: 25.0000 mg | ORAL_TABLET | Freq: Four times a day (QID) | ORAL | Status: DC | PRN

## 2016-02-01 NOTE — Discharge Instructions (Signed)

## 2016-02-01 NOTE — ED Notes (Signed)
From radiology 

## 2016-02-01 NOTE — ED Provider Notes (Signed)
CSN: [id number]     Arrival date & time 02/01/16  0056 History   First MD Initiated Contact with Patient 02/01/16 0106     Chief Complaint  Patient presents with  . Emesis     (Consider location/radiation/quality/duration/timing/severity/associated sxs/prior Treatment) HPI Comments: Patient presents to the ER for evaluatiand vomiting. Pati the symptoms have been ongoing for 2 dayshe has been having nausea and some mild cramping but no continuous abdominal pain. There has not been any fever. Patient denies diarrhea and constipation. No chest pain or shortness of breath.  Patient is a 55 y.o. female presenting with vomiting.  Emesis Associated symptoms: no diarrhea     Past Medical History  Diagnosis Date  . Sarcoidosis ([place])   . Breast fibrocystic disorder   . Mitral valve prolapse   . Breast pain     right  . Normal stress echocardiogram Jan 2011  . Normal nuclear stress test December 2012  . Normal echocardiogram December 2012    EF 55 to 60%; mild MVP, grade 1 diastolic dysfunction, PAP 32  . Fibromyalgia   . DOE (dyspnea on exertion)   . Pulmonary fibrosis ([place])   . Gastroesophageal reflux disease   . Heart disease   . Anemia    Past Surgical History  Procedure Laterality Date  . Tubal ligation    . Breast lumpectomy  01/12/2011    right   Family History  Problem Relation Age of Onset  . Cardiomyopathy Mother   . Breast cancer Mother   . Heart disease Maternal Aunt   . Prostate cancer Father   . Colon cancer Neg Hx   . Neurofibromatosis Brother   . Bipolar disorder Daughter    Social History  Substance Use Topics  . Smoking status: Never Smoker   . Smokeless tobacco: Never Used  . Alcohol Use: No   OB History    No data available     Review of Systems  Gastrointestinal: Positive for nausea and vomiting. Negative for diarrhea.  Genitourinary: Positive for decreased urine volume. Negative for dysuria and frequency.  All other systems reviewed and are  negative.     Allergies  Bee venom; Benadryl; Codeine; Diphenhydramine; and Morphine and related  Home Medications   Prior to Admission medications   Medication Sig Start Date End Date Taking? Authorizing Provider  ALPRAZolam Duanne Moron) 1 MG tablet Take 1 tablet (1 mg total) by mouth 3 (three) times daily. Patient taking differently: Take 0.5-1 mg by mouth 3 (three) times daily as needed for anxiety.  12/06/15   Cloria Spring, MD  azaTHIOprine (IMURAN) 50 MG tablet Take 100 mg by mouth every morning.     Historical Provider, MD  Blood Pressure Monitoring (SPHYGMOMANOMETER) MISC 1 automatic blood pressure monitor. Use as directed 01/02/16   [name], MD  budesonide (PULMICORT) 0.5 MG/2ML nebulizer solution Take 0.5 mg by nebulization daily as needed (for shortness of breath).  05/23/15 05/22/16  Historical Provider, MD  EPINEPHrine 0.3 mg/0.3 mL IJ SOAJ injection Inject 0.3 mLs (0.3 mg total) into the muscle once. 02/18/14   [name], MD  ferrous sulfate 325 (65 FE) MG EC tablet Take 325 mg by mouth daily.    Historical Provider, MD  megestrol (MEGACE) 40 MG tablet Take 1 tablet (40 mg total) by mouth 2 (two) times daily. 01/02/16   [name], MD  ondansetron (ZOFRAN) 8 MG tablet Take 1 tablet (8 mg total) by mouth every 8 (eight) hours as needed for  nausea. 12/28/15   [name], MD  oxyCODONE (OXY IR/ROXICODONE) 5 MG immediate release tablet Take 1 tablet (5 mg total) by mouth 4 (four) times daily as needed for severe pain. 12/28/15   [name], MD  pantoprazole (PROTONIX) 40 MG tablet Take 40 mg by mouth every morning.    Historical Provider, MD  predniSONE (DELTASONE) 5 MG tablet Take 5 mg by mouth daily with breakfast.    Historical Provider, MD  sertraline (ZOLOFT) 100 MG tablet Take 1 tablet (100 mg total) by mouth daily. 12/06/15 12/05/16  Cloria Spring, MD   BP 93/66 mmHg  Pulse 92  Temp(Src) 97.7 F (36.5 C) (Oral)  Resp 18  Ht 5\' 10"  (1.778 m)  Wt 200 lb (90.719  kg)  BMI 28.70 kg/m2  SpO2 97%  LMP 01/25/2016 Physical Exam  Constitutional: She is oriented to person, place, and time. She appears well-developed and well-nourished. No distress.  HENT:  Head: Normocephalic and atraumatic.  Right Ear: Hearing normal.  Left Ear: Hearing normal.  Nose: Nose normal.  Mouth/Throat: Oropharynx is clear and moist and mucous membranes are normal.  Eyes: Conjunctivae and EOM are normal. Pupils are equal, round, and reactive to light.  Neck: Normal range of motion. Neck supple.  Cardiovascular: Regular rhythm, S1 normal and S2 normal.  Exam reveals no gallop and no friction rub.   No murmur heard. Pulmonary/Chest: Effort normal and breath sounds normal. No respiratory distress. She exhibits no tenderness.  Abdominal: Soft. Normal appearance and bowel sounds are normal. There is no hepatosplenomegaly. There is no tenderness. There is no rebound, no guarding, no tenderness at McBurney's point and negative Murphy's sign. No hernia.  Musculoskeletal: Normal range of motion.  Neurological: She is alert and oriented to person, place, and time. She has normal strength. No cranial nerve deficit or sensory deficit. Coordination normal. GCS eye subscore is 4. GCS verbal subscore is 5. GCS motor subscore is 6.  Skin: Skin is warm, dry and intact. No rash noted. No cyanosis.  Psychiatric: She has a normal mood and affect. Her speech is normal and behavior is normal. Thought content normal.  Nursing note and vitals reviewed.   ED Course  Procedures (including critical care time) Labs Review Labs Reviewed  COMPREHENSIVE METABOLIC PANEL - Abnormal; Notable for the following:    Calcium 8.6 (*)    ALT 12 (*)    All other components within normal limits  LIPASE, BLOOD  CBC  URINALYSIS, ROUTINE W REFLEX MICROSCOPIC (NOT AT [facility])    Imaging Review Dg Abd Acute W/chest  02/01/2016  CLINICAL DATA:  Vomiting EXAM: DG ABDOMEN ACUTE W/ 1V CHEST COMPARISON:  12/29/2015  FINDINGS: Nonobstructive bowel gas pattern. Prominent stool retention without rectal impaction. No concerning intra-abdominal mass effect or calcification. No pneumoperitoneum. Avascular necrosis of the left femoral head with subchondral lucency, known based on 2016 pelvis MRI. Chronic lung disease with hyperinflation and paramediastinal confluent fibrosis. No superimposed opacity when compared to prior. No cardiomegaly. IMPRESSION: 1. Moderate stool volume without obstruction or rectal impaction. 2. Sarcoid with pulmonary fibrosis. No acute superimposed finding in the chest. Electronically Signed   By: Monte Fantasia M.D.   On: 02/01/2016 02:06   I have personally reviewed and evaluated these images and lab results as part of my medical decision-making.   EKG Interpretation None      MDM   Final diagnoses:  None  Nausea and vomiting  Patient presents to the ER for evaluation of  nausea and vomiting. Patient reports that symptoms have been present for 2 days. She has not been experiencing abdominal pain with her nausea and vomiting. No diarrhea or constipation. Patient reports that she has had decreased urine output which is concerning for mild dehydration. BUN and creatinine, however, were unremarkable. Urinalysis did not show signs of infection and her specific gravity was normal. All of her lab work was normal including a normal white blood cell count. Abdominal exam was benign and nontender. Plain film x-ray does show moderate stool volume but there is no sign of impaction or obstruction. Patient treated with IV fluids, Zofran, Reglan in the ER with improvement. Workup has been unremarkable and her exam is essentially normal, therefore she is appropriate for discharge and symptomatic treatment.    Orpah Greek, MD 02/01/16 [phone]

## 2016-02-01 NOTE — ED Notes (Signed)
To radiology

## 2016-02-01 NOTE — Telephone Encounter (Signed)
Home Health nurse spoke to you yesterday about her BP dropping and her vomiting She did end up going to the ED yesterday evening. It has stayed up since then and  No more vomiting. Pt wants to know if you want her to wait and see how it goes  Or do you want to see her?

## 2016-02-01 NOTE — ED Notes (Signed)
Pt c/o vomiting x 2 days.

## 2016-02-01 NOTE — Telephone Encounter (Signed)
If doing ok she does not have to follow up at this time but if BP issues or frequent nausea the she needs to follow up sooner than her next scheduled ov

## 2016-02-02 NOTE — Telephone Encounter (Addendum)
Patient advised If doing ok she does not have to follow up at this time but if BP issues or frequent nausea the she needs to follow up sooner than her next scheduled ov. Patient stated she is doing ok - scheduled regular office visit in July will schedule office visit sooner if having problems.

## 2016-02-08 ENCOUNTER — Other Ambulatory Visit: Payer: Self-pay | Admitting: Family Medicine

## 2016-02-08 MED FILL — PANTOPRAZOLE SOD DR 40 MG T: 40 | 90 days supply | Qty: 90 | Fill #0

## 2016-02-08 MED FILL — azaTHIOprine 50 MG TABS: 50 | 90 days supply | Qty: 180 | Fill #0

## 2016-02-09 DIAGNOSIS — D860 Sarcoidosis of lung: Secondary | ICD-10-CM | POA: Diagnosis not present

## 2016-02-09 DIAGNOSIS — K529 Noninfective gastroenteritis and colitis, unspecified: Secondary | ICD-10-CM | POA: Diagnosis not present

## 2016-02-09 DIAGNOSIS — I959 Hypotension, unspecified: Secondary | ICD-10-CM | POA: Diagnosis not present

## 2016-02-20 DIAGNOSIS — F329 Major depressive disorder, single episode, unspecified: Secondary | ICD-10-CM | POA: Diagnosis not present

## 2016-02-20 DIAGNOSIS — Z803 Family history of malignant neoplasm of breast: Secondary | ICD-10-CM | POA: Diagnosis not present

## 2016-02-20 DIAGNOSIS — Z79899 Other long term (current) drug therapy: Secondary | ICD-10-CM | POA: Diagnosis not present

## 2016-02-20 DIAGNOSIS — R29898 Other symptoms and signs involving the musculoskeletal system: Secondary | ICD-10-CM | POA: Diagnosis not present

## 2016-02-20 DIAGNOSIS — M879 Osteonecrosis, unspecified: Secondary | ICD-10-CM | POA: Diagnosis not present

## 2016-02-20 DIAGNOSIS — R471 Dysarthria and anarthria: Secondary | ICD-10-CM | POA: Diagnosis not present

## 2016-02-20 DIAGNOSIS — R0602 Shortness of breath: Secondary | ICD-10-CM | POA: Diagnosis not present

## 2016-02-20 DIAGNOSIS — K219 Gastro-esophageal reflux disease without esophagitis: Secondary | ICD-10-CM | POA: Diagnosis not present

## 2016-02-20 DIAGNOSIS — S8290XA Unspecified fracture of unspecified lower leg, initial encounter for closed fracture: Secondary | ICD-10-CM | POA: Diagnosis not present

## 2016-02-20 DIAGNOSIS — D860 Sarcoidosis of lung: Secondary | ICD-10-CM | POA: Diagnosis not present

## 2016-02-20 DIAGNOSIS — N6019 Diffuse cystic mastopathy of unspecified breast: Secondary | ICD-10-CM | POA: Diagnosis not present

## 2016-02-20 DIAGNOSIS — J45909 Unspecified asthma, uncomplicated: Secondary | ICD-10-CM | POA: Diagnosis not present

## 2016-02-20 DIAGNOSIS — R270 Ataxia, unspecified: Secondary | ICD-10-CM | POA: Diagnosis not present

## 2016-02-20 DIAGNOSIS — F419 Anxiety disorder, unspecified: Secondary | ICD-10-CM | POA: Diagnosis not present

## 2016-02-20 DIAGNOSIS — Z7952 Long term (current) use of systemic steroids: Secondary | ICD-10-CM | POA: Diagnosis not present

## 2016-02-21 DIAGNOSIS — D860 Sarcoidosis of lung: Secondary | ICD-10-CM | POA: Diagnosis not present

## 2016-02-21 DIAGNOSIS — M87052 Idiopathic aseptic necrosis of left femur: Secondary | ICD-10-CM | POA: Diagnosis not present

## 2016-02-21 DIAGNOSIS — G6289 Other specified polyneuropathies: Secondary | ICD-10-CM | POA: Diagnosis not present

## 2016-02-22 ENCOUNTER — Other Ambulatory Visit: Payer: Self-pay

## 2016-02-22 LAB — POC HEMOCCULT BLD/STL (HOME/3-CARD/SCREEN)
Card #2 Fecal Occult Blod, POC: NEGATIVE
Card #3 Fecal Occult Blood, POC: NEGATIVE
Fecal Occult Blood, POC: NEGATIVE

## 2016-02-22 MED ORDER — OXYCODONE HCL 5 MG PO TABS: 5.0000 mg | ORAL_TABLET | Freq: Four times a day (QID) | ORAL | Status: DC | PRN

## 2016-02-22 MED FILL — oxyCODONE HCL 5 MG TABS: 5 | 30 days supply | Qty: 120 | Fill #0

## 2016-03-06 ENCOUNTER — Ambulatory Visit (HOSPITAL_COMMUNITY): Payer: Self-pay | Admitting: Psychiatry

## 2016-03-20 DIAGNOSIS — F419 Anxiety disorder, unspecified: Secondary | ICD-10-CM | POA: Diagnosis not present

## 2016-03-20 DIAGNOSIS — J45909 Unspecified asthma, uncomplicated: Secondary | ICD-10-CM | POA: Diagnosis not present

## 2016-03-20 DIAGNOSIS — M1612 Unilateral primary osteoarthritis, left hip: Secondary | ICD-10-CM | POA: Diagnosis not present

## 2016-03-20 DIAGNOSIS — D860 Sarcoidosis of lung: Secondary | ICD-10-CM | POA: Diagnosis not present

## 2016-03-20 DIAGNOSIS — M8700 Idiopathic aseptic necrosis of unspecified bone: Secondary | ICD-10-CM | POA: Diagnosis not present

## 2016-03-20 DIAGNOSIS — R270 Ataxia, unspecified: Secondary | ICD-10-CM | POA: Diagnosis not present

## 2016-03-23 ENCOUNTER — Other Ambulatory Visit: Payer: Self-pay | Admitting: Family Medicine

## 2016-03-23 ENCOUNTER — Ambulatory Visit (INDEPENDENT_AMBULATORY_CARE_PROVIDER_SITE_OTHER): Payer: 59 | Admitting: Family Medicine

## 2016-03-23 ENCOUNTER — Telehealth: Payer: Self-pay | Admitting: *Deleted

## 2016-03-23 ENCOUNTER — Encounter: Payer: Self-pay | Admitting: Family Medicine

## 2016-03-23 ENCOUNTER — Other Ambulatory Visit: Payer: Self-pay | Admitting: *Deleted

## 2016-03-23 ENCOUNTER — Telehealth: Payer: Self-pay | Admitting: Family Medicine

## 2016-03-23 VITALS — BP 118/70 | Ht 70.0 in | Wt 207.0 lb

## 2016-03-23 DIAGNOSIS — M8700 Idiopathic aseptic necrosis of unspecified bone: Secondary | ICD-10-CM | POA: Diagnosis not present

## 2016-03-23 DIAGNOSIS — M1612 Unilateral primary osteoarthritis, left hip: Secondary | ICD-10-CM | POA: Diagnosis not present

## 2016-03-23 DIAGNOSIS — D860 Sarcoidosis of lung: Secondary | ICD-10-CM | POA: Diagnosis not present

## 2016-03-23 DIAGNOSIS — J45909 Unspecified asthma, uncomplicated: Secondary | ICD-10-CM | POA: Diagnosis not present

## 2016-03-23 DIAGNOSIS — Z79899 Other long term (current) drug therapy: Secondary | ICD-10-CM

## 2016-03-23 DIAGNOSIS — D62 Acute posthemorrhagic anemia: Secondary | ICD-10-CM

## 2016-03-23 DIAGNOSIS — E785 Hyperlipidemia, unspecified: Secondary | ICD-10-CM

## 2016-03-23 DIAGNOSIS — F419 Anxiety disorder, unspecified: Secondary | ICD-10-CM | POA: Diagnosis not present

## 2016-03-23 DIAGNOSIS — R270 Ataxia, unspecified: Secondary | ICD-10-CM | POA: Diagnosis not present

## 2016-03-23 MED ORDER — OXYCODONE HCL 5 MG PO TABS: ORAL_TABLET | ORAL | 0 refills | Status: DC

## 2016-03-23 MED ORDER — OXYCODONE HCL 10 MG PO TABS: ORAL_TABLET | ORAL | 0 refills | Status: DC

## 2016-03-23 MED ORDER — OXYCODONE HCL 7.5 MG PO TABS: ORAL_TABLET | ORAL | 0 refills | Status: DC

## 2016-03-23 MED FILL — oxyCODONE HCL 10 MG TABS: 10 | 15 days supply | Qty: 60 | Fill #0

## 2016-03-23 NOTE — Telephone Encounter (Signed)
New rx for oxycodone ready for pickup. Pt notified. See previous messages

## 2016-03-23 NOTE — Telephone Encounter (Signed)
Discussed with pt. Pt verbalized understanding. Script ready for pickup.  

## 2016-03-23 NOTE — Telephone Encounter (Signed)
Please let the patient know that I am willing to use a 5 mg tablet 1-1/2 maximum 4 times per day, in my opinion going to a 10 mg tablet is too strong- new prescription would be for oxycodone 5 mg, 180 tablets, directions one and a half every 6 hours when necessary pain

## 2016-03-23 NOTE — Telephone Encounter (Signed)
7.5 mg oxycodone's are not available therefore the patient will use 10 mg we gave her prescription for 60 tablets she is to take 1 every 6-8 hours as needed if it causes drowsiness she will stop it and go back to her previous dose she will let us know in a couple weeks how this is doing and she will try to stick with 3 or less per day if possible

## 2016-03-23 NOTE — Telephone Encounter (Signed)
Discussed with pt. Pt states she cannot cut the pills in half. She states when she has tried in the past the pill just crumples. Consult with dr Nicki Reaper. Give rx for oxycodone 10mg  #60 one every 6 - 8 hours prn pain. Call back in 2 weeks with update.

## 2016-03-23 NOTE — Telephone Encounter (Signed)
Patient requesting a nurse to call her regarding her pain medication.  She was wrote for a higher strength pain medication today, but the pharmacy does not have it in stock.  Please advise.  They told her she would either have to go to 10 mg, or do something else.

## 2016-03-23 NOTE — Progress Notes (Signed)
   Subjective:    Patient ID: Kathleen [name], female    DOB: 1961/03/06, 55 y.o.   MRN: [id number]  HPI This patient was seen today for chronic pain. Takes for left hip pain  The medication list was reviewed and updated.   -Compliance with medication: yes  - Number patient states they take daily: takes 2 -3 a day  -when was the last dose patient took? Last night  The patient was advised the importance of maintaining medication and not using illegal substances with these.  Refills needed: yes  The patient was educated that we can provide 3 monthly scripts for their medication, it is their responsibility to follow the instructions.  Side effects or complications from medications: none  Patient is aware that pain medications are meant to minimize the severity of the pain to allow their pain levels to improve to allow for better function. They are aware of that pain medications cannot totally remove their pain.  Due for UDT ( at least once per year) : pt due today but cannot do because she does not have her cath with her.   Needs refill on epi pen and oxycodone     25 minutes was spent with the patient. Greater than half the time was spent in discussion and answering questions and counseling regarding the issues that the patient came in for today.   Review of Systems  Constitutional: Negative for activity change and appetite change.  Gastrointestinal: Negative for abdominal pain and vomiting.  Musculoskeletal: Positive for arthralgias and back pain.  Neurological: Negative for weakness.  Psychiatric/Behavioral: Negative for confusion.       Objective:   Physical Exam  Constitutional: She appears well-nourished. No distress.  HENT:  Head: Normocephalic.  Cardiovascular: Normal rate, regular rhythm and normal heart sounds.   No murmur heard. Pulmonary/Chest: Effort normal and breath sounds normal.  Musculoskeletal: She exhibits tenderness. She exhibits no edema.    Lymphadenopathy:    She has no cervical adenopathy.  Neurological: She is alert.  Psychiatric: Her behavior is normal.  Vitals reviewed. She states her moods overall are doing okay She has chronic hip pain cannot get surgery currently She has chronic lung problems seen with the specialists no flareups recently no fevers She also has iron deficient anemia need some follow-up lab work.      Assessment & Plan:  Depression stable follow-up with specialist continue medication Lung condition follow-up with specialist stable on current medications Reflux doing well with current medication watch diet Hyperlipidemia check lipid profile await results Anemia-lab work indicated look for iron deficient anemia Chronic hip pain increase her pain medication 7.5 mg 4 times a day when necessary Xanax patient was cautioned to cut back to 0.5 mg no more than 3 times a day not with her pain meds she was told the increased risk of accidental overdoses with pain medicines nerve pills that increases risk by 40% I encouraged her to discuss her case with her psychiatrist about cutting back on her Xanax

## 2016-03-23 NOTE — Telephone Encounter (Signed)
This strength is no longer available in generic . Patietn would need to be on 5 mg or 10 mg per pharmacy

## 2016-03-26 ENCOUNTER — Telehealth (HOSPITAL_COMMUNITY): Payer: Self-pay | Admitting: *Deleted

## 2016-03-26 DIAGNOSIS — J45909 Unspecified asthma, uncomplicated: Secondary | ICD-10-CM | POA: Diagnosis not present

## 2016-03-26 DIAGNOSIS — R270 Ataxia, unspecified: Secondary | ICD-10-CM | POA: Diagnosis not present

## 2016-03-26 DIAGNOSIS — M8700 Idiopathic aseptic necrosis of unspecified bone: Secondary | ICD-10-CM | POA: Diagnosis not present

## 2016-03-26 DIAGNOSIS — D860 Sarcoidosis of lung: Secondary | ICD-10-CM | POA: Diagnosis not present

## 2016-03-26 DIAGNOSIS — F419 Anxiety disorder, unspecified: Secondary | ICD-10-CM | POA: Diagnosis not present

## 2016-03-26 DIAGNOSIS — M1612 Unilateral primary osteoarthritis, left hip: Secondary | ICD-10-CM | POA: Diagnosis not present

## 2016-03-26 MED ORDER — ALPRAZOLAM 1 MG PO TABS: 1.0000 mg | ORAL_TABLET | Freq: Three times a day (TID) | ORAL | 0 refills | Status: DC | PRN

## 2016-03-26 NOTE — Telephone Encounter (Signed)
Called pt pharmacy on file and spoke with [place]. 1 mo supply called in.

## 2016-03-26 NOTE — Telephone Encounter (Signed)
Pt pharmacy requesting refills for Xanax. Pt medication last filled on 12-06-15 with 90 tablets 2 refills. Pt had an appt for March 06, 2016 but had to resch due to provider being out of office. Pt f/u appt is 04-04-16. Pt pharmacy number is [phone].

## 2016-03-26 NOTE — Telephone Encounter (Signed)
Per Dr. Harrington Challenger to call in 1 mo supply of pt Xanax to her pharmacy. Called pharmacy on file and spoke with [facility] and she verbalized understanding.

## 2016-03-26 NOTE — Telephone Encounter (Signed)
You may call in 30 day supply 

## 2016-03-28 DIAGNOSIS — D860 Sarcoidosis of lung: Secondary | ICD-10-CM | POA: Diagnosis not present

## 2016-03-28 DIAGNOSIS — R270 Ataxia, unspecified: Secondary | ICD-10-CM | POA: Diagnosis not present

## 2016-03-28 DIAGNOSIS — J45909 Unspecified asthma, uncomplicated: Secondary | ICD-10-CM | POA: Diagnosis not present

## 2016-03-28 DIAGNOSIS — F419 Anxiety disorder, unspecified: Secondary | ICD-10-CM | POA: Diagnosis not present

## 2016-03-28 DIAGNOSIS — M1612 Unilateral primary osteoarthritis, left hip: Secondary | ICD-10-CM | POA: Diagnosis not present

## 2016-03-28 DIAGNOSIS — M8700 Idiopathic aseptic necrosis of unspecified bone: Secondary | ICD-10-CM | POA: Diagnosis not present

## 2016-04-04 ENCOUNTER — Telehealth: Payer: Self-pay | Admitting: Family Medicine

## 2016-04-04 ENCOUNTER — Encounter (HOSPITAL_COMMUNITY): Payer: Self-pay | Admitting: Psychiatry

## 2016-04-04 ENCOUNTER — Ambulatory Visit (INDEPENDENT_AMBULATORY_CARE_PROVIDER_SITE_OTHER): Payer: 59 | Admitting: Psychiatry

## 2016-04-04 VITALS — BP 102/61 | HR 65 | Ht 70.0 in | Wt 210.6 lb

## 2016-04-04 DIAGNOSIS — F411 Generalized anxiety disorder: Secondary | ICD-10-CM

## 2016-04-04 DIAGNOSIS — F332 Major depressive disorder, recurrent severe without psychotic features: Secondary | ICD-10-CM

## 2016-04-04 MED ORDER — ALPRAZOLAM 1 MG PO TABS: 0.5000 mg | ORAL_TABLET | Freq: Three times a day (TID) | ORAL | 2 refills | Status: DC | PRN

## 2016-04-04 MED ORDER — SERTRALINE HCL 100 MG PO TABS: 100.0000 mg | ORAL_TABLET | Freq: Every day | ORAL | 2 refills | Status: DC

## 2016-04-04 MED FILL — SERTRALINE HCL 100 MG TAB: 100 | 30 days supply | Qty: 30 | Fill #0

## 2016-04-04 NOTE — Telephone Encounter (Signed)
Form in doctors folder

## 2016-04-04 NOTE — Telephone Encounter (Signed)
Pt dropped off a handicap placard form to be filled out. Form in nurse box.
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
## 2016-04-05 DIAGNOSIS — F419 Anxiety disorder, unspecified: Secondary | ICD-10-CM | POA: Diagnosis not present

## 2016-04-05 DIAGNOSIS — M1612 Unilateral primary osteoarthritis, left hip: Secondary | ICD-10-CM | POA: Diagnosis not present

## 2016-04-05 DIAGNOSIS — M8700 Idiopathic aseptic necrosis of unspecified bone: Secondary | ICD-10-CM | POA: Diagnosis not present

## 2016-04-05 DIAGNOSIS — D860 Sarcoidosis of lung: Secondary | ICD-10-CM | POA: Diagnosis not present

## 2016-04-05 DIAGNOSIS — J45909 Unspecified asthma, uncomplicated: Secondary | ICD-10-CM | POA: Diagnosis not present

## 2016-04-05 DIAGNOSIS — R270 Ataxia, unspecified: Secondary | ICD-10-CM | POA: Diagnosis not present

## 2016-04-05 MED FILL — ALPRAZolam 1 MG TABS: 1 | 30 days supply | Qty: 90 | Fill #0

## 2016-04-05 NOTE — Telephone Encounter (Signed)
completed

## 2016-04-05 NOTE — Telephone Encounter (Signed)
Patient notified

## 2016-04-09 DIAGNOSIS — D860 Sarcoidosis of lung: Secondary | ICD-10-CM | POA: Diagnosis not present

## 2016-04-09 DIAGNOSIS — J45909 Unspecified asthma, uncomplicated: Secondary | ICD-10-CM | POA: Diagnosis not present

## 2016-04-09 DIAGNOSIS — F419 Anxiety disorder, unspecified: Secondary | ICD-10-CM | POA: Diagnosis not present

## 2016-04-09 DIAGNOSIS — M8700 Idiopathic aseptic necrosis of unspecified bone: Secondary | ICD-10-CM | POA: Diagnosis not present

## 2016-04-09 DIAGNOSIS — R270 Ataxia, unspecified: Secondary | ICD-10-CM | POA: Diagnosis not present

## 2016-04-09 DIAGNOSIS — M1612 Unilateral primary osteoarthritis, left hip: Secondary | ICD-10-CM | POA: Diagnosis not present

## 2016-04-12 DIAGNOSIS — R270 Ataxia, unspecified: Secondary | ICD-10-CM | POA: Diagnosis not present

## 2016-04-12 DIAGNOSIS — D860 Sarcoidosis of lung: Secondary | ICD-10-CM | POA: Diagnosis not present

## 2016-04-12 DIAGNOSIS — M1612 Unilateral primary osteoarthritis, left hip: Secondary | ICD-10-CM | POA: Diagnosis not present

## 2016-04-12 DIAGNOSIS — M8700 Idiopathic aseptic necrosis of unspecified bone: Secondary | ICD-10-CM | POA: Diagnosis not present

## 2016-04-12 DIAGNOSIS — F419 Anxiety disorder, unspecified: Secondary | ICD-10-CM | POA: Diagnosis not present

## 2016-04-12 DIAGNOSIS — J45909 Unspecified asthma, uncomplicated: Secondary | ICD-10-CM | POA: Diagnosis not present

## 2016-04-17 ENCOUNTER — Ambulatory Visit (HOSPITAL_COMMUNITY): Payer: 59 | Admitting: Psychology

## 2016-04-18 DIAGNOSIS — R270 Ataxia, unspecified: Secondary | ICD-10-CM | POA: Diagnosis not present

## 2016-04-18 DIAGNOSIS — F419 Anxiety disorder, unspecified: Secondary | ICD-10-CM | POA: Diagnosis not present

## 2016-04-18 DIAGNOSIS — M8700 Idiopathic aseptic necrosis of unspecified bone: Secondary | ICD-10-CM | POA: Diagnosis not present

## 2016-04-18 DIAGNOSIS — J45909 Unspecified asthma, uncomplicated: Secondary | ICD-10-CM | POA: Diagnosis not present

## 2016-04-18 DIAGNOSIS — M1612 Unilateral primary osteoarthritis, left hip: Secondary | ICD-10-CM | POA: Diagnosis not present

## 2016-04-18 DIAGNOSIS — D860 Sarcoidosis of lung: Secondary | ICD-10-CM | POA: Diagnosis not present

## 2016-04-20 ENCOUNTER — Encounter: Payer: Self-pay | Admitting: Family Medicine

## 2016-04-20 ENCOUNTER — Ambulatory Visit (INDEPENDENT_AMBULATORY_CARE_PROVIDER_SITE_OTHER): Payer: 59 | Admitting: Family Medicine

## 2016-04-20 VITALS — BP 110/80 | Ht 70.0 in | Wt 209.0 lb

## 2016-04-20 DIAGNOSIS — R300 Dysuria: Secondary | ICD-10-CM

## 2016-04-20 DIAGNOSIS — R339 Retention of urine, unspecified: Secondary | ICD-10-CM

## 2016-04-20 DIAGNOSIS — M87052 Idiopathic aseptic necrosis of left femur: Secondary | ICD-10-CM | POA: Diagnosis not present

## 2016-04-20 DIAGNOSIS — G894 Chronic pain syndrome: Secondary | ICD-10-CM | POA: Diagnosis not present

## 2016-04-20 LAB — POCT URINALYSIS DIPSTICK
Spec Grav, UA: 1.02
pH, UA: 5

## 2016-04-20 MED ORDER — OXYCODONE HCL 5 MG PO TABS: 5.0000 mg | ORAL_TABLET | Freq: Four times a day (QID) | ORAL | 0 refills | Status: DC | PRN

## 2016-04-20 MED ORDER — CEFPROZIL 500 MG PO TABS: 500.0000 mg | ORAL_TABLET | Freq: Two times a day (BID) | ORAL | 0 refills | Status: DC

## 2016-04-20 MED FILL — CEFPROZIL 500 MG TABLET: 500 | 5 days supply | Qty: 10 | Fill #0

## 2016-04-20 NOTE — Progress Notes (Signed)
Subjective:    Patient ID: ONI ORE, female    DOB: 03-05-1961, 55 y.o.   MRN: [id number]  HPI This patient was seen today for chronic pain  The medication list was reviewed and updated.   -Compliance with medication: yes  - Number patient states they take daily: 2 daily  -when was the last dose patient took: today   The patient was advised the importance of maintaining medication and not using illegal substances with these.  Refills needed: yes  The patient was educated that we can provide 3 monthly scripts for their medication, it is their responsibility to follow the instructions.  Side effects or complications from medications: Patient wants to decrease the strength of her pain med. It is too strong.   Patient is aware that pain medications are meant to minimize the severity of the pain to allow their pain levels to improve to allow for better function. They are aware of that pain medications cannot totally remove their pain.  Due for UDT ( at least once per year) : Patient unable to give adequate amount of urine for drug testing. Needs catheter.   Patient states that she thinks she has a urinary tract infection. Onset 2 days ago.    Results for orders placed or performed in visit on 04/20/16  POCT urinalysis dipstick  Result Value Ref Range   Color, UA     Clarity, UA     Glucose, UA     Bilirubin, UA     Ketones, UA     Spec Grav, UA 1.020    Blood, UA     pH, UA 5.0    Protein, UA     Urobilinogen, UA     Nitrite, UA     Leukocytes, UA  Negative    patient unable to do urine specimen for  Urine drug screen today because she does not have a catheter with her she will bring this on the next visit. Patient drug registry was reviewed. No abnormality seen   Has ongoing hip pain discomfort sometimes radiates into the leg  also has history of frequent urinary tract infections having dysuria urinary from C  patient states depression doing well sees  specialist Her lung issue is stable see specialist for this as well.    25 minutes was spent with the patient. Greater than half the time was spent in discussion and answering questions and counseling regarding the issues that the patient came in for today.  Review of Systems     Objective:   Physical Exam  lungs clear heart regular some sciatic discomfort in the left leg. Some pain in the hip. Extremities no edema.       Assessment & Plan:  The patient was seen today as part of a comprehensive visit regarding pain control. Patient's compliance with the medication as well as discussion regarding effectiveness was completed. Prescriptions were written. Patient was advised to follow-up in 3 months. The patient was assessed for any signs of severe side effects. The patient was advised to take the medicine as directed and to report to [place] if any side effect issues.  patient asked a much better she does not one of be on oxycodone 10 any longer we will reduce this a 5 mg She is aware to not take nerve medication and pain medicine together she does have psychiatric illness followed by psychiatrist she is cutting back on Xanax and using the pain medicine sparingly  Chronic lung condition  stable  Hip chronic hip pain patient is disabled she does not one have any type of surgery currently.   Urinary tract infection antibiotics prescribed  TENS unit prescribed for her hip and leg hopefully this will help  Patient to follow-up 3 months from now regarding pain

## 2016-04-24 MED FILL — oxyCODONE HCL 5 MG TABS: 5 | 30 days supply | Qty: 120 | Fill #0

## 2016-05-15 ENCOUNTER — Ambulatory Visit (INDEPENDENT_AMBULATORY_CARE_PROVIDER_SITE_OTHER): Payer: 59 | Admitting: Psychology

## 2016-05-15 ENCOUNTER — Encounter (HOSPITAL_COMMUNITY): Payer: Self-pay | Admitting: Psychology

## 2016-05-15 DIAGNOSIS — F332 Major depressive disorder, recurrent severe without psychotic features: Secondary | ICD-10-CM
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
## 2016-05-17 MED FILL — predniSONE 5 MG TABS: 5 | 90 days supply | Qty: 90 | Fill #2

## 2016-06-06 ENCOUNTER — Telehealth: Payer: Self-pay | Admitting: Family Medicine

## 2016-06-06 NOTE — Telephone Encounter (Signed)
Pt dropped off a handicap placard form to be filled out. Form in nurse box.

## 2016-06-07 NOTE — Telephone Encounter (Signed)
Form completed, copy made for the chart.  I notified the patient that she can pick this up.

## 2016-06-07 NOTE — Telephone Encounter (Signed)
done

## 2016-06-12 ENCOUNTER — Ambulatory Visit (HOSPITAL_COMMUNITY): Payer: 59 | Admitting: Psychology

## 2016-06-13 MED FILL — oxyCODONE HCL 5 MG TABS: 5 | 30 days supply | Qty: 120 | Fill #0

## 2016-06-18 DIAGNOSIS — J479 Bronchiectasis, uncomplicated: Secondary | ICD-10-CM | POA: Diagnosis not present

## 2016-06-18 DIAGNOSIS — R0602 Shortness of breath: Secondary | ICD-10-CM | POA: Diagnosis not present

## 2016-06-18 DIAGNOSIS — J9811 Atelectasis: Secondary | ICD-10-CM | POA: Diagnosis not present

## 2016-06-18 DIAGNOSIS — R918 Other nonspecific abnormal finding of lung field: Secondary | ICD-10-CM | POA: Diagnosis not present

## 2016-06-18 DIAGNOSIS — D869 Sarcoidosis, unspecified: Secondary | ICD-10-CM | POA: Diagnosis not present

## 2016-06-18 DIAGNOSIS — Z803 Family history of malignant neoplasm of breast: Secondary | ICD-10-CM | POA: Diagnosis not present

## 2016-06-18 DIAGNOSIS — D860 Sarcoidosis of lung: Secondary | ICD-10-CM | POA: Diagnosis not present

## 2016-06-20 ENCOUNTER — Ambulatory Visit (INDEPENDENT_AMBULATORY_CARE_PROVIDER_SITE_OTHER): Payer: 59 | Admitting: Psychology

## 2016-06-20 DIAGNOSIS — F411 Generalized anxiety disorder: Secondary | ICD-10-CM

## 2016-06-20 DIAGNOSIS — F332 Major depressive disorder, recurrent severe without psychotic features: Secondary | ICD-10-CM

## 2016-06-27 ENCOUNTER — Encounter (HOSPITAL_COMMUNITY): Payer: Self-pay | Admitting: Psychology

## 2016-06-27 NOTE — Progress Notes (Signed)
PROGRESS NOTE  Patient:  Kathleen [name]   DOB: 1961-08-20  MR Number: [id number]  Location: [facility] [address] [place] [address] Dept: [phone]  Start: 8 AM End: 9 AM  Provider/Observer:     Edgardo Roys PSYD  Chief Complaint:      Chief Complaint  Patient presents with  . Anxiety  . Depression  . Stress  . Trauma    Reason For Service:    The patient was referred back to our office by her treating physicians Sallee Lange, MD do to significant increases in anxiety and depression I seen the patient back in 2003 and again in 2009 for severe depression and anxiety. Significant psychosocial features and stressors were present at that time. The patient also been diagnosed with fibromyalgia and other pain related conditions. 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 She was told 10 years ago according to the patient, that her condition was in a non-active phase. However, she had increasing problems with breathing and even though her doctor at that time did not think it was an active stay Dr. Wolfgang Phoenix set her up to see someone in [place] because of her concerns and his concerns about the situation. She was diagnosed with an active case and had lost 50% of her lung function and needed immune suppressant medications. Since that time, she is developed other subsequent conditions likely as a result of these needed medications. This includes the development of an active case of shingles which is causing her a great deal of pain and bladder difficulties at this point. The patient has been experiencing severe depression and anxiety and constant ruminations about frustration and fear towards past Drs. that the patient felt did not listen to her and her reports of difficulties. These are the patient's impressions and has not had any  communications with previous doctors but it is clear that she is very upset about the permanent loss of lung function and he is enormously grateful to Dr. Wolfgang Phoenix for identifying the need for further intervention and setting her up with the specialist in [facility] who is able to diagnosis and begin treating her condition.  The patient has continued to get treatment for her lung damage/condition.  But she is now having complications from the treatment that is essential including possible issues with hip.  She is also likely having blood flow issues with feet and hands (rhanoids?).  The patient is returning with worsening lung functioning and degenerative hip issues due to blood flow.  However, the patient can not have surgery due to medications for her lungs and and pulmonary functioning.   Interventions Strategy:  Cognitive/behavioral psychotherapeutic interventions  Participation Level:   Active  Participation Quality:  Appropriate      Behavioral Observation:  Well Groomed, Alert, and Depressed and Tearful.   Current Psychosocial Factors: The patient reports that she has been doing very poorly over the past week or so. She reports that her anxiety and depression have significantly increased and she has struggling with whether or not to have surgery on her hip is causing her severe pain but at the same time fearful about what might happen with regard to her pulmonary condition. She is fearful that if she stops taking her current medication regimen which she has to do for to have the surgery that her condition that has remained stable with this  medication will start to progress again. This verifies her..  Content of Session:   Review current symptoms and continued work on therapeutic interventions.  Current Status:   The patient reports that she has continued to have a lot of stress and worry and her medical status has worsened.  She is having trouble walking due to hip degeneration.  Patient  Progress:   Stable but continued to be severely depressed.  Target Goals:   Reduce the intensity, frequency, and duration of severe depression.  Last Reviewed:   06/20/2016  Goals Addressed Today:    Worked on issues specifically with her ruminating nature regarding her severe depression and anxiety.  Impression/Diagnosis:   The patient is clearly overwhelmed by her current situation and significant reduction in physical functioning primarily related to her autoimmune disorder and its significant deleterious effect it is having on her pulmonary function. She has been told she has at least lost 50% of her lung function and it will not come back and they're trying to keep her from losing more. The patient has a history of significant depression and anxiety in the past as well as traumatic experiences. The patient is severely depressed and anxious at this point and overwhelmed by her status in ruminating about her situation.   Diagnosis:    Axis I: Severe episode of recurrent major depressive disorder, without psychotic features ([place])  Generalized anxiety disorder        RODENBOUGH,JOHN R, PsyD 06/27/2016

## 2016-07-05 ENCOUNTER — Ambulatory Visit (INDEPENDENT_AMBULATORY_CARE_PROVIDER_SITE_OTHER): Payer: 59 | Admitting: Psychiatry

## 2016-07-05 ENCOUNTER — Encounter (HOSPITAL_COMMUNITY): Payer: Self-pay | Admitting: Psychiatry

## 2016-07-05 VITALS — BP 114/69 | HR 75 | Ht 70.0 in | Wt 208.6 lb

## 2016-07-05 DIAGNOSIS — Z79899 Other long term (current) drug therapy: Secondary | ICD-10-CM | POA: Diagnosis not present

## 2016-07-05 DIAGNOSIS — Z888 Allergy status to other drugs, medicaments and biological substances status: Secondary | ICD-10-CM | POA: Diagnosis not present

## 2016-07-05 DIAGNOSIS — F332 Major depressive disorder, recurrent severe without psychotic features: Secondary | ICD-10-CM

## 2016-07-05 DIAGNOSIS — Z91030 Bee allergy status: Secondary | ICD-10-CM | POA: Diagnosis not present

## 2016-07-05 MED ORDER — SERTRALINE HCL 100 MG PO TABS: 100.0000 mg | ORAL_TABLET | Freq: Every day | ORAL | 2 refills | Status: DC

## 2016-07-05 MED ORDER — ALPRAZOLAM 1 MG PO TABS: 0.5000 mg | ORAL_TABLET | Freq: Three times a day (TID) | ORAL | 2 refills | Status: DC | PRN
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
## 2016-07-18 MED FILL — oxyCODONE HCL 5 MG TABS: 5 | 30 days supply | Qty: 120 | Fill #0

## 2016-07-23 ENCOUNTER — Ambulatory Visit (INDEPENDENT_AMBULATORY_CARE_PROVIDER_SITE_OTHER): Payer: 59 | Admitting: Family Medicine

## 2016-07-23 ENCOUNTER — Encounter: Payer: Self-pay | Admitting: Family Medicine

## 2016-07-23 VITALS — BP 128/82 | Ht 70.0 in | Wt 212.8 lb

## 2016-07-23 DIAGNOSIS — Z79899 Other long term (current) drug therapy: Secondary | ICD-10-CM | POA: Diagnosis not present

## 2016-07-23 DIAGNOSIS — Z13220 Encounter for screening for lipoid disorders: Secondary | ICD-10-CM | POA: Diagnosis not present

## 2016-07-23 DIAGNOSIS — Z79891 Long term (current) use of opiate analgesic: Secondary | ICD-10-CM

## 2016-07-23 DIAGNOSIS — R6881 Early satiety: Secondary | ICD-10-CM | POA: Diagnosis not present

## 2016-07-23 DIAGNOSIS — M87052 Idiopathic aseptic necrosis of left femur: Secondary | ICD-10-CM

## 2016-07-23 MED ORDER — OXYCODONE HCL 5 MG PO TABS: 5.0000 mg | ORAL_TABLET | Freq: Four times a day (QID) | ORAL | 0 refills | Status: DC | PRN

## 2016-07-23 NOTE — Progress Notes (Signed)
Subjective:    Patient ID: Kathleen [name], female    DOB: 11/23/1960, 55 y.o.   MRN: [id number]  HPI This patient was seen today for chronic pain  The medication list was reviewed and updated.   -Compliance with medication: yes-oxycodone  - Number patient states they take daily: 3 a day  -when was the last dose patient took? This am  The patient was advised the importance of maintaining medication and not using illegal substances with these.  Refills needed: yes  The patient was educated that we can provide 3 monthly scripts for their medication, it is their responsibility to follow the instructions.  Side effects or complications from medications: none  Patient is aware that pain medications are meant to minimize the severity of the pain to allow their pain levels to improve to allow for better function. They are aware of that pain medications cannot totally remove their pain.  Due for UDT ( at least once per year) : today Long discussion held today with the patient regarding how pain medications are to help take the edge off the pain but will not totally remove the pain will just make it more manageable. In addition to this discussion was held that nerve medication with pain medicine as a bad idea. Paced states that she rarely if ever takes Xanax that's prescribed by the psychiatrist. Patient is having ongoing left hip pain but it does radiate some down the leg. Her orthopedist raised concern that there could be some underlying nerve impingement in the back but did not set her up with the specialists Patient states her pulmonary status is doing better. She states they took her off Imuran. They have her on 5 mg prednisone daily, may plan on seeing her back in the spring.       Review of Systems  Constitutional: Negative for activity change and appetite change.  Gastrointestinal: Negative for abdominal pain and vomiting.  Neurological: Negative for weakness.    Psychiatric/Behavioral: Negative for confusion.       Objective:   Physical Exam  Constitutional: She appears well-nourished. No distress.  HENT:  Head: Normocephalic.  Cardiovascular: Normal rate, regular rhythm and normal heart sounds.   No murmur heard. Pulmonary/Chest: Effort normal and breath sounds normal.  Musculoskeletal: She exhibits no edema.  Lymphadenopathy:    She has no cervical adenopathy.  Neurological: She is alert.  Psychiatric: Her behavior is normal.  Vitals reviewed.  Patient denies being depressed she feels her moods overall are doing better she describes difficulty sleeping but the main issue is her hip and leg pain Patient does relate how her appetite has decreased over the past 6 months to where she might eat a little bit in then typically feel full and not one eat any further she denies abdominal pain and rectal bleeding.      Assessment & Plan:  The patient was seen today as part of a comprehensive visit regarding pain control. Patient's compliance with the medication as well as discussion regarding effectiveness was completed. Prescriptions were written. Patient was advised to follow-up in 3 months. The patient was assessed for any signs of severe side effects. The patient was advised to take the medicine as directed and to report to [place] if any side effect issues. 3 scripts were written. Urine drug screen obtained today. Hold to scripts for now  Chronic lung issues stable currently off Imuran  Left hip pain she is to go back to see orthopedics. Have this further  evaluated. If they do not feel her avascular necrosis of the left hip is causing her pain they will be sending her up with the spine specialist at [facility] certainly if the patient needs [place] to help set her up locally we will be happy to do so  25 minutes was spent with the patient covering multiple different options in covering how her moods are doing as well as chronic pain left hip pain and how her  lungs are doing. Greater in half in discussion

## 2016-07-27 DIAGNOSIS — R6881 Early satiety: Secondary | ICD-10-CM | POA: Diagnosis not present

## 2016-07-27 DIAGNOSIS — Z79899 Other long term (current) drug therapy: Secondary | ICD-10-CM | POA: Diagnosis not present

## 2016-07-27 DIAGNOSIS — Z13220 Encounter for screening for lipoid disorders: Secondary | ICD-10-CM | POA: Diagnosis not present

## 2016-07-27 LAB — TOXASSURE SELECT 13 (MW), URINE

## 2016-07-28 LAB — BASIC METABOLIC PANEL
BUN/Creatinine Ratio: 13 (ref 9–23)
BUN: 12 mg/dL (ref 6–24)
CO2: 25 mmol/L (ref 18–29)
Calcium: 10.2 mg/dL (ref 8.7–10.2)
Chloride: 101 mmol/L (ref 96–106)
Creatinine, Ser: 0.91 mg/dL (ref 0.57–1.00)
GFR calc Af Amer: 83 mL/min/{1.73_m2} (ref >59)
GFR calc non Af Amer: 72 mL/min/{1.73_m2} (ref >59)
Glucose: 86 mg/dL (ref 65–99)
Potassium: 4.7 mmol/L (ref 3.5–5.2)
Sodium: 142 mmol/L (ref 134–144)

## 2016-07-28 LAB — CBC WITH DIFFERENTIAL/PLATELET
Basophils Absolute: 0 10*3/uL (ref 0.0–0.2)
Basos: 1 %
EOS (ABSOLUTE): 0.2 10*3/uL (ref 0.0–0.4)
Eos: 2 %
Hematocrit: 37.6 % (ref 34.0–46.6)
Hemoglobin: 12.7 g/dL (ref 11.1–15.9)
Immature Grans (Abs): 0 10*3/uL (ref 0.0–0.1)
Immature Granulocytes: 1 %
Lymphocytes Absolute: 0.8 10*3/uL (ref 0.7–3.1)
Lymphs: 12 %
MCH: 28.9 pg (ref 26.6–33.0)
MCHC: 33.8 g/dL (ref 31.5–35.7)
MCV: 86 fL (ref 79–97)
Monocytes Absolute: 0.6 10*3/uL (ref 0.1–0.9)
Monocytes: 10 %
Neutrophils Absolute: 4.7 10*3/uL (ref 1.4–7.0)
Neutrophils: 74 %
Platelets: 353 10*3/uL (ref 150–379)
RBC: 4.39 x10E6/uL (ref 3.77–5.28)
RDW: 15.2 % (ref 12.3–15.4)
WBC: 6.3 10*3/uL (ref 3.4–10.8)

## 2016-07-28 LAB — HEPATIC FUNCTION PANEL
ALT: 14 IU/L (ref 0–32)
AST: 12 IU/L (ref 0–40)
Albumin: 4.2 g/dL (ref 3.5–5.5)
Alkaline Phosphatase: 85 IU/L (ref 39–117)
Bilirubin Total: 0.4 mg/dL (ref 0.0–1.2)
Bilirubin, Direct: 0.11 mg/dL (ref 0.00–0.40)
Total Protein: 6.9 g/dL (ref 6.0–8.5)

## 2016-07-28 LAB — LIPID PANEL
Chol/HDL Ratio: 4.5 ratio units — ABNORMAL HIGH (ref 0.0–4.4)
Cholesterol, Total: 221 mg/dL — ABNORMAL HIGH (ref 100–199)
HDL: 49 mg/dL (ref >39)
LDL Calculated: 133 mg/dL — ABNORMAL HIGH (ref 0–99)
Triglycerides: 193 mg/dL — ABNORMAL HIGH (ref 0–149)
VLDL Cholesterol Cal: 39 mg/dL (ref 5–40)

## 2016-07-28 LAB — LIPASE: Lipase: 31 U/L (ref 14–72)

## 2016-08-08 DIAGNOSIS — M7062 Trochanteric bursitis, left hip: Secondary | ICD-10-CM | POA: Diagnosis not present

## 2016-08-08 DIAGNOSIS — M89752 Major osseous defect, left pelvic region and thigh: Secondary | ICD-10-CM | POA: Diagnosis not present

## 2016-08-08 DIAGNOSIS — M87852 Other osteonecrosis, left femur: Secondary | ICD-10-CM | POA: Diagnosis not present

## 2016-08-08 DIAGNOSIS — M1612 Unilateral primary osteoarthritis, left hip: Secondary | ICD-10-CM | POA: Diagnosis not present

## 2016-08-08 DIAGNOSIS — M87052 Idiopathic aseptic necrosis of left femur: Secondary | ICD-10-CM | POA: Diagnosis not present

## 2016-08-08 MED FILL — DICLOFENAC SOD 75 MG TAB EC: 75 | 30 days supply | Qty: 60 | Fill #0

## 2016-08-16 ENCOUNTER — Encounter (HOSPITAL_COMMUNITY): Payer: Self-pay | Admitting: Emergency Medicine

## 2016-08-16 ENCOUNTER — Emergency Department (HOSPITAL_COMMUNITY)
Admission: EM | Admit: 2016-08-16 | Discharge: 2016-08-16 | Disposition: A | Discharge status: Home / Self Care | Payer: 59 | Source: Home / Self Care | Attending: Emergency Medicine | Admitting: Emergency Medicine

## 2016-08-16 DIAGNOSIS — G43809 Other migraine, not intractable, without status migrainosus: Secondary | ICD-10-CM | POA: Diagnosis not present

## 2016-08-16 DIAGNOSIS — I959 Hypotension, unspecified: Secondary | ICD-10-CM | POA: Diagnosis not present

## 2016-08-16 DIAGNOSIS — J9601 Acute respiratory failure with hypoxia: Secondary | ICD-10-CM | POA: Diagnosis not present

## 2016-08-16 DIAGNOSIS — R1084 Generalized abdominal pain: Secondary | ICD-10-CM

## 2016-08-16 DIAGNOSIS — E271 Primary adrenocortical insufficiency: Secondary | ICD-10-CM | POA: Diagnosis not present

## 2016-08-16 DIAGNOSIS — Z79899 Other long term (current) drug therapy: Secondary | ICD-10-CM | POA: Insufficient documentation

## 2016-08-16 DIAGNOSIS — I272 Pulmonary hypertension, unspecified: Secondary | ICD-10-CM | POA: Diagnosis not present

## 2016-08-16 DIAGNOSIS — A419 Sepsis, unspecified organism: Secondary | ICD-10-CM | POA: Diagnosis not present

## 2016-08-16 DIAGNOSIS — K529 Noninfective gastroenteritis and colitis, unspecified: Secondary | ICD-10-CM | POA: Diagnosis not present

## 2016-08-16 DIAGNOSIS — D869 Sarcoidosis, unspecified: Secondary | ICD-10-CM | POA: Diagnosis not present

## 2016-08-16 DIAGNOSIS — J9621 Acute and chronic respiratory failure with hypoxia: Secondary | ICD-10-CM | POA: Diagnosis not present

## 2016-08-16 DIAGNOSIS — J8410 Pulmonary fibrosis, unspecified: Secondary | ICD-10-CM | POA: Diagnosis not present

## 2016-08-16 DIAGNOSIS — J189 Pneumonia, unspecified organism: Secondary | ICD-10-CM | POA: Diagnosis not present

## 2016-08-16 DIAGNOSIS — E272 Addisonian crisis: Secondary | ICD-10-CM | POA: Diagnosis not present

## 2016-08-16 DIAGNOSIS — I480 Paroxysmal atrial fibrillation: Secondary | ICD-10-CM | POA: Diagnosis not present

## 2016-08-16 DIAGNOSIS — B3324 Viral cardiomyopathy: Secondary | ICD-10-CM | POA: Diagnosis not present

## 2016-08-16 DIAGNOSIS — R6521 Severe sepsis with septic shock: Secondary | ICD-10-CM | POA: Diagnosis not present

## 2016-08-16 DIAGNOSIS — I5021 Acute systolic (congestive) heart failure: Secondary | ICD-10-CM | POA: Diagnosis not present

## 2016-08-16 DIAGNOSIS — R197 Diarrhea, unspecified: Secondary | ICD-10-CM | POA: Insufficient documentation

## 2016-08-16 DIAGNOSIS — E876 Hypokalemia: Secondary | ICD-10-CM | POA: Diagnosis not present

## 2016-08-16 DIAGNOSIS — R112 Nausea with vomiting, unspecified: Secondary | ICD-10-CM

## 2016-08-16 DIAGNOSIS — R0600 Dyspnea, unspecified: Secondary | ICD-10-CM | POA: Diagnosis not present

## 2016-08-16 DIAGNOSIS — I400 Infective myocarditis: Secondary | ICD-10-CM | POA: Diagnosis not present

## 2016-08-16 HISTORY — DX: Idiopathic aseptic necrosis of unspecified bone: M87.00

## 2016-08-16 LAB — URINALYSIS, ROUTINE W REFLEX MICROSCOPIC
Bilirubin Urine: NEGATIVE
Glucose, UA: NEGATIVE mg/dL
Hgb urine dipstick: NEGATIVE
Ketones, ur: NEGATIVE mg/dL
Leukocytes, UA: NEGATIVE
Nitrite: NEGATIVE
Protein, ur: NEGATIVE mg/dL
Specific Gravity, Urine: 1.013 (ref 1.005–1.030)
pH: 5 (ref 5.0–8.0)

## 2016-08-16 LAB — COMPREHENSIVE METABOLIC PANEL
ALT: 15 U/L (ref 14–54)
AST: 17 U/L (ref 15–41)
Albumin: 4 g/dL (ref 3.5–5.0)
Alkaline Phosphatase: 70 U/L (ref 38–126)
Anion gap: 6 (ref 5–15)
BUN: 8 mg/dL (ref 6–20)
CO2: 26 mmol/L (ref 22–32)
Calcium: 9.8 mg/dL (ref 8.9–10.3)
Chloride: 108 mmol/L (ref 101–111)
Creatinine, Ser: 0.77 mg/dL (ref 0.44–1.00)
GFR calc Af Amer: >60 mL/min (ref >60)
GFR calc non Af Amer: >60 mL/min (ref >60)
Glucose, Bld: 100 mg/dL — ABNORMAL HIGH (ref 65–99)
Potassium: 4.5 mmol/L (ref 3.5–5.1)
Sodium: 140 mmol/L (ref 135–145)
Total Bilirubin: 0.6 mg/dL (ref 0.3–1.2)
Total Protein: 7.4 g/dL (ref 6.5–8.1)

## 2016-08-16 LAB — CBC
HCT: 39.6 % (ref 36.0–46.0)
Hemoglobin: 13.2 g/dL (ref 12.0–15.0)
MCH: 29.1 pg (ref 26.0–34.0)
MCHC: 33.3 g/dL (ref 30.0–36.0)
MCV: 87.2 fL (ref 78.0–100.0)
Platelets: 320 10*3/uL (ref 150–400)
RBC: 4.54 MIL/uL (ref 3.87–5.11)
RDW: 14.3 % (ref 11.5–15.5)
WBC: 6.5 10*3/uL (ref 4.0–10.5)

## 2016-08-16 LAB — LIPASE, BLOOD: Lipase: 33 U/L (ref 11–51)

## 2016-08-16 MED ORDER — ONDANSETRON HCL 4 MG/2ML IJ SOLN
4.0000 mg | Freq: Once | INTRAMUSCULAR | Status: AC
  Administered 2016-08-16: 4 mg via INTRAVENOUS
  Filled 2016-08-16: qty 2

## 2016-08-16 MED ORDER — HYDROCORTISONE NA SUCCINATE PF 100 MG IJ SOLR
100.0000 mg | Freq: Once | INTRAMUSCULAR | Status: AC
  Administered 2016-08-16: 100 mg via INTRAVENOUS
  Filled 2016-08-16: qty 2

## 2016-08-16 MED ORDER — SODIUM CHLORIDE 0.9 % IV BOLUS (SEPSIS)
1000.0000 mL | Freq: Once | INTRAVENOUS | Status: AC
  Administered 2016-08-16: 1000 mL via INTRAVENOUS

## 2016-08-16 MED ORDER — ONDANSETRON 4 MG PREPACK (~~LOC~~): ORAL_TABLET | ORAL | 0 refills | Status: DC

## 2016-08-16 MED ORDER — FENTANYL CITRATE (PF) 100 MCG/2ML IJ SOLN
100.0000 ug | Freq: Once | INTRAMUSCULAR | Status: AC
  Administered 2016-08-16: 100 ug via INTRAVENOUS
  Filled 2016-08-16: qty 2

## 2016-08-16 MED ORDER — ONDANSETRON HCL 8 MG PO TABS: 8.0000 mg | ORAL_TABLET | Freq: Three times a day (TID) | ORAL | 0 refills | Status: DC | PRN

## 2016-08-16 NOTE — ED Triage Notes (Signed)
Pt reports n/v/d x 2 days. Pt states she has become weak from being unable to keep anything down. Pt denies known fever.

## 2016-08-16 NOTE — Discharge Instructions (Signed)
Start with a clear liquid diet first, then gradually introduce foods over the next 1-2 days.  If you feel dizzy when you  stand up, sit down immediately.  Return here, if needed, for problems.

## 2016-08-16 NOTE — ED Notes (Signed)
Pt given fluids and warm blankets. No diarrhea since she has been in room

## 2016-08-16 NOTE — ED Provider Notes (Signed)
[facility] DEPT Provider Note   CSN: [id number] Arrival date & time: 08/16/16  1236     History   Chief Complaint Chief Complaint  Patient presents with  . Diarrhea  . Emesis    HPI Kathleen [name] is a 55 y.o. female.  She presents for evaluation of nausea, vomiting, diarrhea, presents for 2 days. Symptoms gradually worsening. No blood in emesis or diarrhea. She is able to ambulate without falling. She drove her vehicle here today. She denies fever, cough, chest pain, focal weakness or paresthesia. She has been unable to tolerate her prednisone. There are no other known modifying factors.  HPI  Past Medical History:  Diagnosis Date  . Anemia   . Avascular bone necrosis ([place])   . Breast fibrocystic disorder   . Breast pain    right  . DOE (dyspnea on exertion)   . Fibromyalgia   . Gastroesophageal reflux disease   . Heart disease   . Mitral valve prolapse   . Normal echocardiogram December 2012   EF 55 to 60%; mild MVP, grade 1 diastolic dysfunction, PAP 32  . Normal nuclear stress test December 2012  . Normal stress echocardiogram Jan 2011  . Pulmonary fibrosis ([place])   . Sarcoidosis [facility])     Patient Active Problem List   Diagnosis Date Noted  . Chronic pain syndrome 04/20/2016  . Vaginal bleeding 01/02/2016  . Acute blood loss anemia 12/31/2015  . Gastroenteritis   . Hypovolemic shock ([place]) 12/30/2015  . Nausea and vomiting 12/29/2015  . Hypotension 12/29/2015  . Dehydration 12/28/2015  . Constipation 11/14/2015  . Avascular necrosis of bone of left hip ([place]) 04/05/2015  . Bimalleolar fracture of right ankle 02/11/2015  . Post herpetic neuralgia 10/22/2014  . Hyperlipidemia 08/25/2014  . Obstructive sleep apnea 02/18/2014  . Obesity, unspecified 05/06/2013  . Fibromyalgia 02/11/2013  . Depression 02/11/2013  . Abdominal pain, other specified site 02/11/2013  . Shingles 02/02/2013  . Urinary retention 02/02/2013  . Pedal edema 01/22/2013  .  Orthostatic hypotension 01/16/2013  . Disturbance of skin sensation 11/26/2012  . Pain in limb 11/26/2012  . Helicobacter pylori gastritis 05/23/2012  . Cough 05/02/2012  . Fibrocystic breast disease in female 04/09/2012  . DOE (dyspnea on exertion) 08/03/2011  . Anxiety state 10/19/2010  . CHEST PAIN 11/22/2008  . Sarcoidosis ([place]) 06/21/2008    Past Surgical History:  Procedure Laterality Date  . BREAST LUMPECTOMY  01/12/2011   right  . TUBAL LIGATION      OB History    Gravida Para Term Preterm AB Living   1 1 1          SAB TAB Ectopic Multiple Live Births                   Home Medications    Prior to Admission medications   Medication Sig Start Date End Date Taking? Authorizing Provider  ALPRAZolam Duanne Moron) 1 MG tablet Take 0.5-1 tablets (0.5-1 mg total) by mouth 3 (three) times daily as needed for anxiety. 07/05/16  Yes Cloria Spring, MD  Blood Pressure Monitoring [facility]) MISC 1 automatic blood pressure monitor. Use as directed 01/02/16  Yes [name], MD  budesonide (PULMICORT) 0.5 MG/2ML nebulizer solution Take 0.5 mg by nebulization daily as needed (for shortness of breath).  05/23/15 08/16/16 Yes Historical Provider, MD  diclofenac (VOLTAREN) 75 MG EC tablet Take 75 mg by mouth 2 (two) times daily. 08/08/16 09/07/16 Yes Historical Provider, MD  EPINEPHrine 0.3 mg/0.3  mL IJ SOAJ injection Inject 0.3 mLs (0.3 mg total) into the muscle once. 02/18/14  Yes [name], MD  ferrous sulfate 325 (65 FE) MG EC tablet Take 325 mg by mouth daily.   Yes Historical Provider, MD  oxyCODONE (ROXICODONE) 5 MG immediate release tablet Take 1 tablet (5 mg total) by mouth every 6 (six) hours as needed for severe pain. 07/23/16  Yes [name], MD  pantoprazole (PROTONIX) 40 MG tablet TAKE 1 TABLET (40 MG TOTAL) BY MOUTH DAILY. 02/08/16  Yes [name], MD  predniSONE (DELTASONE) 5 MG tablet Take 5 mg by mouth daily with breakfast.   Yes Historical Provider, MD    sertraline (ZOLOFT) 100 MG tablet Take 1 tablet (100 mg total) by mouth daily. 07/05/16 07/05/17 Yes Cloria Spring, MD  ondansetron [facility]) 4 mg TABS tablet 1-2 q6 hours prn 08/16/16   Daleen Bo, MD  ondansetron [facility]) 8 MG tablet Take 1 tablet (8 mg total) by mouth every 8 (eight) hours as needed for nausea or vomiting. 08/16/16   Daleen Bo, MD    Family History Family History  Problem Relation Age of Onset  . Cardiomyopathy Mother   . Breast cancer Mother   . Prostate cancer Father   . Neurofibromatosis Brother   . Heart disease Maternal Aunt   . Bipolar disorder Daughter   . Colon cancer Neg Hx     Social History Social History  Substance Use Topics  . Smoking status: Never Smoker  . Smokeless tobacco: Never Used  . Alcohol use No     Allergies   Bee venom; Benadryl [diphenhydramine hcl]; Codeine; Diphenhydramine; and Morphine and related   Review of Systems Review of Systems  All other systems reviewed and are negative.    Physical Exam Updated Vital Signs BP 97/58 (BP Location: Left Arm)   Pulse 71   Temp 97.6 F (36.4 C) (Oral)   Resp 16   Ht 5\' 10"  (1.778 m)   Wt 200 lb (90.7 kg)   LMP 05/17/2016   SpO2 96%   BMI 28.70 kg/m   Physical Exam  Constitutional: She is oriented to person, place, and time. She appears well-developed and well-nourished. No distress.  HENT:  Head: Normocephalic and atraumatic.  Eyes: Conjunctivae and EOM are normal. Pupils are equal, round, and reactive to light.  Neck: Normal range of motion and phonation normal. Neck supple.  Cardiovascular: Normal rate and regular rhythm.   Pulmonary/Chest: Effort normal and breath sounds normal. She exhibits no tenderness.  Abdominal: Soft. She exhibits no distension and no mass. There is tenderness (Diffuse, mild). There is no rebound and no guarding. No hernia.  Musculoskeletal: Normal range of motion.  Neurological: She is alert and oriented to person, place, and time. She  exhibits normal muscle tone.  Skin: Skin is warm and dry.  Psychiatric: She has a normal mood and affect. Her behavior is normal. Judgment and thought content normal.  Nursing note and vitals reviewed.    ED Treatments / Results  Labs (all labs ordered are listed, but only abnormal results are displayed) Labs Reviewed  COMPREHENSIVE METABOLIC PANEL - Abnormal; Notable for the following:       Result Value   Glucose, Bld 100 (*)    All other components within normal limits  LIPASE, BLOOD  CBC  URINALYSIS, ROUTINE W REFLEX MICROSCOPIC    EKG  EKG Interpretation None       Radiology No results found.  Procedures  Procedures (including critical care time)  Medications Ordered in ED Medications  sodium chloride 0.9 % bolus 1,000 mL (0 mLs Intravenous Stopped 08/16/16 1814)  ondansetron (ZOFRAN) injection 4 mg (4 mg Intravenous Given 08/16/16 1550)  fentaNYL (SUBLIMAZE) injection 100 mcg (100 mcg Intravenous Given 08/16/16 1552)  hydrocortisone sodium succinate (SOLU-CORTEF) 100 MG injection 100 mg (100 mg Intravenous Given 08/16/16 1553)  sodium chloride 0.9 % bolus 1,000 mL (0 mLs Intravenous Stopped 08/16/16 1934)     Initial Impression / Assessment and Plan / ED Course  I have reviewed the triage vital signs and the nursing notes.  Pertinent labs & imaging results that were available during my care of the patient were reviewed by me and considered in my medical decision making (see chart for details).  Clinical Course     Medications  sodium chloride 0.9 % bolus 1,000 mL (0 mLs Intravenous Stopped 08/16/16 1814)  ondansetron (ZOFRAN) injection 4 mg (4 mg Intravenous Given 08/16/16 1550)  fentaNYL (SUBLIMAZE) injection 100 mcg (100 mcg Intravenous Given 08/16/16 1552)  hydrocortisone sodium succinate (SOLU-CORTEF) 100 MG injection 100 mg (100 mg Intravenous Given 08/16/16 1553)  sodium chloride 0.9 % bolus 1,000 mL (0 mLs Intravenous Stopped 08/16/16 1934)     Patient Vitals for the past 24 hrs:  BP Temp Temp src Pulse Resp SpO2 Height Weight  08/16/16 1938 97/58 - - 71 16 96 % - -  08/16/16 1242 - - - - - - 5\' 10"  (1.778 m) 200 lb (90.7 kg)  08/16/16 1239 114/70 97.6 F (36.4 C) Oral 88 17 100 % - -    9:02 PM Reevaluation with update and discussion. After initial assessment and treatment, an updated evaluation reveals She's been able to tolerate some food and fluids here. Orthostatic vital signs do not indicate orthostatic hypotension. She was able, and right to the bathroom and back without problem. Findings discussed with the patient and her husband, all questions were answered. Kourtland Coopman L    Final Clinical Impressions(s) / ED Diagnoses   Final diagnoses:  Nausea vomiting and diarrhea     Nonspecific, nausea, vomiting and diarrhea. Blood pressure is soft, however, it is been like that before. She has received a dose of hydrocortisone, to help with supporting her blood pressure. Doubt serious depression metabolic instability or impending vascular collapse.  Nursing Notes Reviewed/ Care Coordinated Applicable Imaging Reviewed Interpretation of Laboratory Data incorporated into ED treatment  The patient appears reasonably screened and/or stabilized for discharge and I doubt any other medical condition or other [facility] requiring further screening, evaluation, or treatment in the ED at this time prior to discharge.  Plan: Home Medications- continue; Home Treatments- rest; return here if the recommended treatment, does not improve the symptoms; Recommended follow up- PCP prn   New Prescriptions New Prescriptions   ONDANSETRON (ZOFRAN) 4 MG TABS TABLET    1-2 q6 hours prn   ONDANSETRON (ZOFRAN) 8 MG TABLET    Take 1 tablet (8 mg total) by mouth every 8 (eight) hours as needed for nausea or vomiting.     Daleen Bo, MD 08/16/16 2103

## 2016-08-17 ENCOUNTER — Inpatient Hospital Stay (HOSPITAL_COMMUNITY)
Admission: EM | Admit: 2016-08-17 | Discharge: 2016-08-22 | DRG: 643 | Disposition: A | Discharge status: Other Acute Inpatient Hospital | Payer: 59 | Attending: Pulmonary Disease | Admitting: Pulmonary Disease

## 2016-08-17 ENCOUNTER — Encounter (HOSPITAL_COMMUNITY): Payer: Self-pay | Admitting: Emergency Medicine

## 2016-08-17 DIAGNOSIS — I5021 Acute systolic (congestive) heart failure: Secondary | ICD-10-CM | POA: Diagnosis present

## 2016-08-17 DIAGNOSIS — A419 Sepsis, unspecified organism: Secondary | ICD-10-CM

## 2016-08-17 DIAGNOSIS — I4891 Unspecified atrial fibrillation: Secondary | ICD-10-CM | POA: Diagnosis not present

## 2016-08-17 DIAGNOSIS — E669 Obesity, unspecified: Secondary | ICD-10-CM | POA: Diagnosis present

## 2016-08-17 DIAGNOSIS — D860 Sarcoidosis of lung: Secondary | ICD-10-CM | POA: Diagnosis present

## 2016-08-17 DIAGNOSIS — I272 Pulmonary hypertension, unspecified: Secondary | ICD-10-CM | POA: Diagnosis present

## 2016-08-17 DIAGNOSIS — G43809 Other migraine, not intractable, without status migrainosus: Secondary | ICD-10-CM

## 2016-08-17 DIAGNOSIS — Z452 Encounter for adjustment and management of vascular access device: Secondary | ICD-10-CM

## 2016-08-17 DIAGNOSIS — R112 Nausea with vomiting, unspecified: Secondary | ICD-10-CM

## 2016-08-17 DIAGNOSIS — R197 Diarrhea, unspecified: Secondary | ICD-10-CM

## 2016-08-17 DIAGNOSIS — I400 Infective myocarditis: Secondary | ICD-10-CM | POA: Diagnosis present

## 2016-08-17 DIAGNOSIS — R0602 Shortness of breath: Secondary | ICD-10-CM

## 2016-08-17 DIAGNOSIS — Z7952 Long term (current) use of systemic steroids: Secondary | ICD-10-CM

## 2016-08-17 DIAGNOSIS — Z4659 Encounter for fitting and adjustment of other gastrointestinal appliance and device: Secondary | ICD-10-CM

## 2016-08-17 DIAGNOSIS — E871 Hypo-osmolality and hyponatremia: Secondary | ICD-10-CM | POA: Diagnosis not present

## 2016-08-17 DIAGNOSIS — R6521 Severe sepsis with septic shock: Secondary | ICD-10-CM | POA: Diagnosis not present

## 2016-08-17 DIAGNOSIS — E271 Primary adrenocortical insufficiency: Secondary | ICD-10-CM | POA: Diagnosis present

## 2016-08-17 DIAGNOSIS — R7989 Other specified abnormal findings of blood chemistry: Secondary | ICD-10-CM

## 2016-08-17 DIAGNOSIS — Z95828 Presence of other vascular implants and grafts: Secondary | ICD-10-CM

## 2016-08-17 DIAGNOSIS — E876 Hypokalemia: Secondary | ICD-10-CM | POA: Diagnosis present

## 2016-08-17 DIAGNOSIS — D869 Sarcoidosis, unspecified: Secondary | ICD-10-CM | POA: Diagnosis present

## 2016-08-17 DIAGNOSIS — Z6832 Body mass index (BMI) 32.0-32.9, adult: Secondary | ICD-10-CM

## 2016-08-17 DIAGNOSIS — J189 Pneumonia, unspecified organism: Secondary | ICD-10-CM | POA: Diagnosis not present

## 2016-08-17 DIAGNOSIS — E2740 Unspecified adrenocortical insufficiency: Secondary | ICD-10-CM | POA: Diagnosis present

## 2016-08-17 DIAGNOSIS — I959 Hypotension, unspecified: Secondary | ICD-10-CM | POA: Diagnosis present

## 2016-08-17 DIAGNOSIS — E272 Addisonian crisis: Principal | ICD-10-CM | POA: Diagnosis present

## 2016-08-17 DIAGNOSIS — A084 Viral intestinal infection, unspecified: Secondary | ICD-10-CM | POA: Diagnosis present

## 2016-08-17 DIAGNOSIS — Z803 Family history of malignant neoplasm of breast: Secondary | ICD-10-CM

## 2016-08-17 DIAGNOSIS — Z8249 Family history of ischemic heart disease and other diseases of the circulatory system: Secondary | ICD-10-CM

## 2016-08-17 DIAGNOSIS — R778 Other specified abnormalities of plasma proteins: Secondary | ICD-10-CM

## 2016-08-17 DIAGNOSIS — R001 Bradycardia, unspecified: Secondary | ICD-10-CM

## 2016-08-17 DIAGNOSIS — K529 Noninfective gastroenteritis and colitis, unspecified: Secondary | ICD-10-CM | POA: Diagnosis present

## 2016-08-17 DIAGNOSIS — M797 Fibromyalgia: Secondary | ICD-10-CM | POA: Diagnosis present

## 2016-08-17 DIAGNOSIS — J8410 Pulmonary fibrosis, unspecified: Secondary | ICD-10-CM | POA: Diagnosis present

## 2016-08-17 DIAGNOSIS — N179 Acute kidney failure, unspecified: Secondary | ICD-10-CM | POA: Diagnosis not present

## 2016-08-17 DIAGNOSIS — K219 Gastro-esophageal reflux disease without esophagitis: Secondary | ICD-10-CM | POA: Diagnosis present

## 2016-08-17 DIAGNOSIS — J9621 Acute and chronic respiratory failure with hypoxia: Secondary | ICD-10-CM

## 2016-08-17 DIAGNOSIS — J9601 Acute respiratory failure with hypoxia: Secondary | ICD-10-CM

## 2016-08-17 LAB — MRSA PCR SCREENING: MRSA by PCR: NEGATIVE

## 2016-08-17 LAB — COMPREHENSIVE METABOLIC PANEL
ALT: 14 U/L (ref 14–54)
AST: 13 U/L — ABNORMAL LOW (ref 15–41)
Albumin: 3.3 g/dL — ABNORMAL LOW (ref 3.5–5.0)
Alkaline Phosphatase: 54 U/L (ref 38–126)
Anion gap: 4 — ABNORMAL LOW (ref 5–15)
BUN: 6 mg/dL (ref 6–20)
CO2: 25 mmol/L (ref 22–32)
Calcium: 8.8 mg/dL — ABNORMAL LOW (ref 8.9–10.3)
Chloride: 112 mmol/L — ABNORMAL HIGH (ref 101–111)
Creatinine, Ser: 0.84 mg/dL (ref 0.44–1.00)
GFR calc Af Amer: >60 mL/min (ref >60)
GFR calc non Af Amer: >60 mL/min (ref >60)
Glucose, Bld: 87 mg/dL (ref 65–99)
Potassium: 3.4 mmol/L — ABNORMAL LOW (ref 3.5–5.1)
Sodium: 141 mmol/L (ref 135–145)
Total Bilirubin: 0.4 mg/dL (ref 0.3–1.2)
Total Protein: 6.1 g/dL — ABNORMAL LOW (ref 6.5–8.1)

## 2016-08-17 LAB — CBC WITH DIFFERENTIAL/PLATELET
Basophils Absolute: 0 10*3/uL (ref 0.0–0.1)
Basophils Relative: 0 %
Eosinophils Absolute: 0.1 10*3/uL (ref 0.0–0.7)
Eosinophils Relative: 2 %
HCT: 35.2 % — ABNORMAL LOW (ref 36.0–46.0)
Hemoglobin: 11.7 g/dL — ABNORMAL LOW (ref 12.0–15.0)
Lymphocytes Relative: 12 %
Lymphs Abs: 0.6 10*3/uL — ABNORMAL LOW (ref 0.7–4.0)
MCH: 29 pg (ref 26.0–34.0)
MCHC: 33.2 g/dL (ref 30.0–36.0)
MCV: 87.3 fL (ref 78.0–100.0)
Monocytes Absolute: 0.5 10*3/uL (ref 0.1–1.0)
Monocytes Relative: 10 %
Neutro Abs: 3.8 10*3/uL (ref 1.7–7.7)
Neutrophils Relative %: 76 %
Platelets: 305 10*3/uL (ref 150–400)
RBC: 4.03 MIL/uL (ref 3.87–5.11)
RDW: 14.1 % (ref 11.5–15.5)
WBC: 5 10*3/uL (ref 4.0–10.5)

## 2016-08-17 LAB — LACTIC ACID, PLASMA: Lactic Acid, Venous: 0.8 mmol/L (ref 0.5–1.9)

## 2016-08-17 LAB — LIPASE, BLOOD: Lipase: 31 U/L (ref 11–51)

## 2016-08-17 LAB — TROPONIN I: Troponin I: <0.03 ng/mL (ref <0.03)

## 2016-08-17 MED ORDER — KETOROLAC TROMETHAMINE 30 MG/ML IJ SOLN
15.0000 mg | Freq: Once | INTRAMUSCULAR | Status: AC
  Administered 2016-08-17: 15 mg via INTRAVENOUS
  Filled 2016-08-17: qty 1

## 2016-08-17 MED ORDER — PROMETHAZINE HCL 25 MG RE SUPP: 25.0000 mg | Freq: Four times a day (QID) | RECTAL | 0 refills | Status: DC | PRN

## 2016-08-17 MED ORDER — OXYCODONE HCL 5 MG PO TABS
5.0000 mg | ORAL_TABLET | Freq: Four times a day (QID) | ORAL | Status: DC | PRN
  Administered 2016-08-17: 5 mg via ORAL
  Filled 2016-08-17: qty 1

## 2016-08-17 MED ORDER — FENTANYL CITRATE (PF) 100 MCG/2ML IJ SOLN
100.0000 ug | Freq: Once | INTRAMUSCULAR | Status: AC
  Administered 2016-08-17: 100 ug via INTRAVENOUS
  Filled 2016-08-17: qty 2

## 2016-08-17 MED ORDER — FAMOTIDINE 20 MG PO TABS: 20.0000 mg | ORAL_TABLET | Freq: Two times a day (BID) | ORAL | 0 refills | Status: DC

## 2016-08-17 MED ORDER — ACETAMINOPHEN 650 MG RE SUPP: 650.0000 mg | Freq: Four times a day (QID) | RECTAL | Status: DC | PRN

## 2016-08-17 MED ORDER — PROCHLORPERAZINE EDISYLATE 5 MG/ML IJ SOLN
10.0000 mg | Freq: Once | INTRAMUSCULAR | Status: AC
  Administered 2016-08-17: 10 mg via INTRAVENOUS
  Filled 2016-08-17: qty 2

## 2016-08-17 MED ORDER — HYDROCORTISONE NA SUCCINATE PF 100 MG IJ SOLR
100.0000 mg | Freq: Three times a day (TID) | INTRAMUSCULAR | Status: DC
  Administered 2016-08-17 – 2016-08-18 (×2): 100 mg via INTRAVENOUS
  Filled 2016-08-17 (×3): qty 2

## 2016-08-17 MED ORDER — SERTRALINE HCL 100 MG PO TABS
100.0000 mg | ORAL_TABLET | Freq: Every day | ORAL | Status: DC
  Administered 2016-08-17 – 2016-08-19 (×3): 100 mg via ORAL
  Filled 2016-08-17: qty 2
  Filled 2016-08-17: qty 1
  Filled 2016-08-17 (×2): qty 2

## 2016-08-17 MED ORDER — SODIUM CHLORIDE 0.9 % IV BOLUS (SEPSIS)
1000.0000 mL | Freq: Once | INTRAVENOUS | Status: AC
  Administered 2016-08-17: 1000 mL via INTRAVENOUS

## 2016-08-17 MED ORDER — ONDANSETRON HCL 4 MG PO TABS
4.0000 mg | ORAL_TABLET | Freq: Four times a day (QID) | ORAL | Status: DC | PRN
  Administered 2016-08-17: 4 mg via ORAL
  Filled 2016-08-17: qty 1

## 2016-08-17 MED ORDER — ALBUTEROL SULFATE (2.5 MG/3ML) 0.083% IN NEBU
INHALATION_SOLUTION | RESPIRATORY_TRACT | Status: AC
  Administered 2016-08-17: 2.5 mg
  Filled 2016-08-17: qty 3

## 2016-08-17 MED ORDER — SODIUM CHLORIDE 0.9 % IV BOLUS (SEPSIS)
2000.0000 mL | Freq: Once | INTRAVENOUS | Status: AC
  Administered 2016-08-17: 2000 mL via INTRAVENOUS

## 2016-08-17 MED ORDER — POTASSIUM CHLORIDE IN NACL 40-0.9 MEQ/L-% IV SOLN
INTRAVENOUS | Status: DC
  Administered 2016-08-17: 100 mL/h via INTRAVENOUS

## 2016-08-17 MED ORDER — ACETAMINOPHEN 325 MG PO TABS
650.0000 mg | ORAL_TABLET | Freq: Four times a day (QID) | ORAL | Status: DC | PRN
  Administered 2016-08-17 – 2016-08-19 (×4): 650 mg via ORAL
  Filled 2016-08-17 (×4): qty 2

## 2016-08-17 MED ORDER — HYDROCORTISONE NA SUCCINATE PF 100 MG IJ SOLR
100.0000 mg | Freq: Once | INTRAMUSCULAR | Status: AC
  Administered 2016-08-17: 100 mg via INTRAVENOUS
  Filled 2016-08-17: qty 2

## 2016-08-17 MED ORDER — DEXAMETHASONE SODIUM PHOSPHATE 10 MG/ML IJ SOLN
10.0000 mg | Freq: Once | INTRAMUSCULAR | Status: DC
  Filled 2016-08-17: qty 1

## 2016-08-17 MED ORDER — FERROUS SULFATE 325 (65 FE) MG PO TABS
325.0000 mg | ORAL_TABLET | Freq: Every day | ORAL | Status: DC
  Administered 2016-08-18 – 2016-08-19 (×2): 325 mg via ORAL
  Filled 2016-08-17 (×4): qty 1

## 2016-08-17 MED ORDER — ONDANSETRON HCL 4 MG/2ML IJ SOLN
4.0000 mg | Freq: Four times a day (QID) | INTRAMUSCULAR | Status: DC | PRN
  Administered 2016-08-18 – 2016-08-21 (×4): 4 mg via INTRAVENOUS
  Filled 2016-08-17 (×5): qty 2

## 2016-08-17 MED ORDER — ENOXAPARIN SODIUM 40 MG/0.4ML ~~LOC~~ SOLN
40.0000 mg | SUBCUTANEOUS | Status: DC
  Administered 2016-08-17 – 2016-08-19 (×3): 40 mg via SUBCUTANEOUS
  Filled 2016-08-17 (×3): qty 0.4

## 2016-08-17 MED ORDER — BUDESONIDE 0.5 MG/2ML IN SUSP
0.5000 mg | Freq: Every day | RESPIRATORY_TRACT | Status: DC | PRN
  Filled 2016-08-17: qty 2

## 2016-08-17 MED ORDER — PANTOPRAZOLE SODIUM 40 MG PO TBEC
40.0000 mg | DELAYED_RELEASE_TABLET | Freq: Two times a day (BID) | ORAL | Status: DC
  Administered 2016-08-17 – 2016-08-19 (×5): 40 mg via ORAL
  Filled 2016-08-17 (×6): qty 1

## 2016-08-17 MED ORDER — PROMETHAZINE HCL 12.5 MG PO TABS
25.0000 mg | ORAL_TABLET | Freq: Once | ORAL | Status: AC
  Administered 2016-08-17: 25 mg via ORAL
  Filled 2016-08-17: qty 2

## 2016-08-17 NOTE — H&P (Signed)
History and Physical  Kathleen [name] [id number] DOB: 1960-12-13 DOA: 08/17/2016  Referring physician: Dr Eulis Foster, ED physician PCP: Sallee Lange, MD  Outpatient Specialists:   Chief Complaint: nausea, vomiting, diarrhea  HPI: Kathleen [name] is a 55 y.o. female with a history of gastroesophageal reflux disease, fibromyalgia, sarcoidosis with pulmonary fibrosis. Patient seen for nausea, vomiting, diarrhea for 3 days. Patient was seen in the emergency department yesterday, was fluid hydrated and given a dose of Solu-Cortef 100 mg IV. Patient has been unable to take her prednisone answer symptoms began. Patient has been intolerant of oral food and liquid as these make her symptoms worse. She briefly improved after the fluids and steroids yesterday, but her symptoms returned.  Emergency Department Course: The patient became hypotensive in the emergency department, although has not had orthostatic hypotension. Patient received 1 L of fluids and a dose of Solu-Cortef 100 mg IV.  Review of Systems:   Pt denies any fevers, chills, constipation, abdominal pain, shortness of breath, dyspnea on exertion, orthopnea, cough, wheezing, palpitations, headache, vision changes, lightheadedness, dizziness, melena, rectal bleeding.  Review of systems are otherwise negative  Past Medical History:  Diagnosis Date  . Anemia   . Avascular bone necrosis ([place])   . Breast fibrocystic disorder   . Breast pain    right  . DOE (dyspnea on exertion)   . Fibromyalgia   . Gastroesophageal reflux disease   . Heart disease   . Mitral valve prolapse   . Normal echocardiogram December 2012   EF 55 to 60%; mild MVP, grade 1 diastolic dysfunction, PAP 32  . Normal nuclear stress test December 2012  . Normal stress echocardiogram Jan 2011  . Pulmonary fibrosis ([place])   . Sarcoidosis [facility])    Past Surgical History:  Procedure Laterality Date  . BREAST LUMPECTOMY  01/12/2011   right  . TUBAL LIGATION     Social  History:  reports that she has never smoked. She has never used smokeless tobacco. She reports that she does not drink alcohol or use drugs. Patient lives at [facility]  . Bee Venom Anaphylaxis  . Benadryl [Diphenhydramine Hcl] Nausea Only and Swelling    "makes my throat close up" "makes my throat close up"  . Codeine Itching    Other reaction(s): ITCHING  . Diphenhydramine Nausea Only    "makes my throat close up" "makes my throat close up"  . Morphine And Related Other (See Comments)    Unknown reaction- pt gets sick, dizzy, and confused Intolerance, not an allergy, received this in the emergency department recently without a problem.    Family History  Problem Relation Age of Onset  . Cardiomyopathy Mother   . Breast cancer Mother   . Prostate cancer Father   . Neurofibromatosis Brother   . Heart disease Maternal Aunt   . Bipolar disorder Daughter   . Colon cancer Neg Hx       Prior to Admission medications   Medication Sig Start Date End Date Taking? Authorizing Provider  Blood Pressure Monitoring (SPHYGMOMANOMETER) MISC 1 automatic blood pressure monitor. Use as directed 01/02/16  Yes [name], MD  budesonide (PULMICORT) 0.5 MG/2ML nebulizer solution Take 0.5 mg by nebulization daily as needed (for shortness of breath).  05/23/15 08/17/16 Yes Historical Provider, MD  diclofenac (VOLTAREN) 75 MG EC tablet Take 75 mg by mouth 2 (two) times daily. 08/08/16 09/07/16 Yes Historical Provider, MD  EPINEPHrine 0.3 mg/0.3 mL IJ SOAJ injection Inject  0.3 mLs (0.3 mg total) into the muscle once. 02/18/14  Yes [name], MD  ferrous sulfate 325 (65 FE) MG EC tablet Take 325 mg by mouth daily.   Yes Historical Provider, MD  ondansetron (ZOFRAN) 8 MG tablet Take 1 tablet (8 mg total) by mouth every 8 (eight) hours as needed for nausea or vomiting. 08/16/16  Yes Daleen Bo, MD  oxyCODONE (ROXICODONE) 5 MG immediate release tablet Take 1 tablet (5 mg  total) by mouth every 6 (six) hours as needed for severe pain. 07/23/16  Yes [name], MD  pantoprazole (PROTONIX) 40 MG tablet TAKE 1 TABLET (40 MG TOTAL) BY MOUTH DAILY. 02/08/16  Yes [name], MD  predniSONE (DELTASONE) 5 MG tablet Take 5 mg by mouth daily with breakfast.   Yes Historical Provider, MD  sertraline (ZOLOFT) 100 MG tablet Take 1 tablet (100 mg total) by mouth daily. 07/05/16 07/05/17 Yes Cloria Spring, MD  ALPRAZolam Duanne Moron) 1 MG tablet Take 0.5-1 tablets (0.5-1 mg total) by mouth 3 (three) times daily as needed for anxiety. Patient not taking: Reported on 08/17/2016 07/05/16   Cloria Spring, MD  famotidine (PEPCID) 20 MG tablet Take 1 tablet (20 mg total) by mouth 2 (two) times daily. 08/17/16   Evalee Jefferson, PA-C  promethazine (PHENERGAN) 25 MG suppository Place 1 suppository (25 mg total) rectally every 6 (six) hours as needed for nausea or vomiting. 08/17/16   Evalee Jefferson, PA-C    Physical Exam: BP (!) 92/40   Pulse (!) 43   Temp 97.9 F (36.6 C) (Oral)   Resp (!) 9   Ht 5\' 10"  (1.778 m)   Wt 90.7 kg (200 lb)   LMP 05/17/2016   SpO2 94%   BMI 28.70 kg/m   General: 39 Caucasian female. Awake and alert and oriented x3. No acute cardiopulmonary distress.  HEENT: Normocephalic atraumatic.  Right and left ears normal in appearance.  Pupils equal, round, reactive to light. Extraocular muscles are intact. Sclerae anicteric and noninjected.  Moist mucosal membranes. No mucosal lesions.  Neck: Neck supple without lymphadenopathy. No carotid bruits. No masses palpated.  Cardiovascular: Slightly bradycardic. Normal S1-S2 sounds. No murmurs, rubs, gallops auscultated. No JVD.  Respiratory: Good respiratory effort with no wheezes, rales, rhonchi. Lungs clear to auscultation bilaterally.  No accessory muscle use. Abdomen: Soft, mild diffuse tenderness without rebound or guarding. Nondistended. Active bowel sounds. No masses or hepatosplenomegaly  Skin: No rashes,  lesions, or ulcerations.  Dry, warm to touch. 2+ dorsalis pedis and radial pulses. Musculoskeletal: No calf or leg pain. All major joints not erythematous nontender.  No upper or lower joint deformation.  Good ROM.  No contractures  Psychiatric: Intact judgment and insight. Pleasant and cooperative. Neurologic: No focal neurological deficits. Strength is 5/5 and symmetric in upper and lower extremities.  Cranial nerves II through XII are grossly intact.           Labs on Admission: I have personally reviewed following labs and imaging studies  CBC:  Recent Labs Lab 08/16/16 1316 08/17/16 1055  WBC 6.5 5.0  NEUTROABS  --  3.8  HGB 13.2 11.7*  HCT 39.6 35.2*  MCV 87.2 87.3  PLT 320 [id number]   Basic Metabolic Panel:  Recent Labs Lab 08/16/16 1316 08/17/16 1055  NA 140 141  K 4.5 3.4*  CL 108 112*  CO2 26 25  GLUCOSE 100* 87  BUN 8 6  CREATININE 0.77 0.84  CALCIUM 9.8 8.8*  GFR: Estimated Creatinine Clearance: 94.6 mL/min (by C-G formula based on SCr of 0.84 mg/dL). Liver Function Tests:  Recent Labs Lab 08/16/16 1316 08/17/16 1055  AST 17 13*  ALT 15 14  ALKPHOS 70 54  BILITOT 0.6 0.4  PROT 7.4 6.1*  ALBUMIN 4.0 3.3*    Recent Labs Lab 08/16/16 1316 08/17/16 1055  LIPASE 33 31   No results for input(s): AMMONIA in the last 168 hours. Coagulation Profile: No results for input(s): INR, PROTIME in the last 168 hours. Cardiac Enzymes:  Recent Labs Lab 08/17/16 1055  TROPONINI <0.03   BNP (last 3 results) No results for input(s): PROBNP in the last 8760 hours. HbA1C: No results for input(s): HGBA1C in the last 72 hours. CBG: No results for input(s): GLUCAP in the last 168 hours. Lipid Profile: No results for input(s): CHOL, HDL, LDLCALC, TRIG, CHOLHDL, LDLDIRECT in the last 72 hours. Thyroid Function Tests: No results for input(s): TSH, T4TOTAL, FREET4, T3FREE, THYROIDAB in the last 72 hours. Anemia Panel: No results for input(s): VITAMINB12,  FOLATE, FERRITIN, TIBC, IRON, RETICCTPCT in the last 72 hours. Urine analysis:    Component Value Date/Time   COLORURINE YELLOW 08/16/2016 [place] 08/16/2016 1247   LABSPEC 1.013 08/16/2016 1247   PHURINE 5.0 08/16/2016 1247   GLUCOSEU NEGATIVE 08/16/2016 1247   GLUCOSEU NEGATIVE 05/29/2010 1622   HGBUR NEGATIVE 08/16/2016 1247   BILIRUBINUR NEGATIVE 08/16/2016 1247   BILIRUBINUR Positive 08/16/2015 0928   KETONESUR NEGATIVE 08/16/2016 1247   PROTEINUR NEGATIVE 08/16/2016 1247   UROBILINOGEN 0.2 06/16/2014 0942   NITRITE NEGATIVE 08/16/2016 1247   LEUKOCYTESUR NEGATIVE 08/16/2016 1247   Sepsis Labs: @LABRCNTIP (procalcitonin:4,lacticidven:4) )No results found for this or any previous visit (from the past 240 hour(s)).   Radiological Exams on Admission: No results found.  EKG: Independently reviewed. Sinus rhythm. No acute ST changes.  Assessment/Plan: Principal Problem:   Adrenal insufficiency (Addison's disease) ([place]) Active Problems:   Sarcoidosis ([place])   Hypotension   Gastroenteritis   Hypokalemia    This patient was discussed with the ED physician, including pertinent vitals, physical exam findings, labs, and imaging.  We also discussed care given by the ED provider.  #1 adrenal insufficiency secondary to chronic steroid use  Observation on stepdown  Continue Solu-Cortef 100 mg IV every 8 hours  Anticipate that the patient should improve over the next 24 hours and should be able to return home #2 gastroenteritis  Clear liquid diet  Anti-emetics #3 hypokalemia  We'll replace potassium #4 hypotension  We'll bolus with 2 L of IV fluids, then 125 mL per hour #5 sarcoidosis  Continue steroids.  DVT prophylaxis: Lovenox Consultants: None Code Status: Full code Family Communication: None  Disposition Plan: Patient should be able to return home following observation.   Truett Mainland, DO [facility] Pager [phone]  If  7PM-7AM, please contact night-coverage [email or web address] Password [email or web address]

## 2016-08-17 NOTE — ED Provider Notes (Signed)
  Face-to-face evaluation   History: She presents for persistent nausea and vomiting, now with headache and chest discomfort. Seen by me yesterday with nausea, vomiting, diarrhea, treated symptomatically, as well as received hydrocortisone for stress dosing, since she is on chronic prednisone treatment. States she has been essentially unable to eat or drink anything since she left the emergency department, yesterday. She is not having. She has not had any diarrhea today.  Physical exam: Alert, calm, cooperative. Abdomen mildly tender to palpation. Neurologic- alert and oriented 3. No aphasia or dysarthria.  Medications  sodium chloride 0.9 % bolus 1,000 mL (0 mLs Intravenous Stopped 08/17/16 1147)  prochlorperazine (COMPAZINE) injection 10 mg (10 mg Intravenous Given 08/17/16 1049)  fentaNYL (SUBLIMAZE) injection 100 mcg (100 mcg Intravenous Given 08/17/16 1049)  ketorolac (TORADOL) 30 MG/ML injection 15 mg (15 mg Intravenous Given 08/17/16 1050)  sodium chloride 0.9 % bolus 1,000 mL (1,000 mLs Intravenous New Bag/Given 08/17/16 1517)  promethazine (PHENERGAN) tablet 25 mg (25 mg Oral Given 08/17/16 1614)  hydrocortisone sodium succinate (SOLU-CORTEF) 100 MG injection 100 mg (100 mg Intravenous Given 08/17/16 1614)    Patient Vitals for the past 24 hrs:  BP Temp Temp src Pulse Resp SpO2 Height Weight  08/17/16 1630 112/69 - - 65 23 100 % - -  08/17/16 1600 (!) 86/50 - - (!) 44 11 96 % - -  08/17/16 1530 (!) 75/37 - - - 17 - - -  08/17/16 1442 (!) 104/54 - - 80 19 98 % - -  08/17/16 1300 (!) 90/44 - - 65 15 96 % - -  08/17/16 1230 (!) 87/51 - - 65 11 96 % - -  08/17/16 1200 (!) 103/52 - - 77 16 95 % - -  08/17/16 1130 (!) 86/44 - - (!) 56 13 99 % - -  08/17/16 1103 - - - - - 94 % - -  08/17/16 1100 (!) 101/54 - - 84 25 (!) 89 % - -  08/17/16 1000 113/75 - - 75 - 97 % - -  08/17/16 0952 - - - - - - 5\' 10"  (1.778 m) 200 lb (90.7 kg)  08/17/16 0951 122/56 97.9 F (36.6 C) Oral 82 20 100  % - -    4:40 PM-Consult complete with Hospitalist. Patient case explained and discussed. he agrees to admit patient for further evaluation and treatment. Call ended at [place] Performed by: Richarda Blade Total critical care time: 35 minutes Critical care time was exclusive of separately billable procedures and treating other patients. Critical care was necessary to treat or prevent imminent or life-threatening deterioration. Critical care was time spent personally by me on the following activities: development of treatment plan with patient and/or surrogate as well as nursing, discussions with consultants, evaluation of patient's response to treatment, examination of patient, obtaining history from patient or surrogate, ordering and performing treatments and interventions, ordering and review of laboratory studies, ordering and review of radiographic studies, pulse oximetry and re-evaluation of patient's condition.     Medical screening examination/treatment/procedure(s) were conducted as a shared visit with non-physician practitioner(s) and myself.  I personally evaluated the patient during the encounter    Daleen Bo, MD 08/22/16 1625

## 2016-08-17 NOTE — ED Notes (Signed)
Attempted to call report

## 2016-08-17 NOTE — ED Notes (Signed)
Pt O2 saturation 86% on room air.  Pt is now on 2LPM nasal cannual.  O2 saturation 95%.

## 2016-08-17 NOTE — Discharge Instructions (Signed)
Make sure you are drinking plenty of fluids.  Use the phenergan suppository if you are unable to keep the zofran down (which was prescribed last night).  Get rechecked if you have any worsened, new or persistent symptoms.  I have prescribed pepcid for you to take in addition to your protonix for better relief from gastritis.

## 2016-08-17 NOTE — ED Triage Notes (Signed)
Pt reports n/v and migraine x3 days with onset of cp in right chest since this morning.  Pt alert and oriented, appears anxious in triage.

## 2016-08-17 NOTE — ED Provider Notes (Signed)
[facility] DEPT Provider Note   CSN: [id number] Arrival date & time: 08/17/16  0941     History   Chief Complaint Chief Complaint  Patient presents with  . Emesis  . Chest Pain    HPI Kathleen [name] is a 55 y.o. female who was seen here yesterday for nausea, vomiting and diarrhea and left here feeling somewhat better, but with return of the nausea and vomiting along with a typical migraine headache as well which includes light sensitivity but no vision changes, focal weakness or prodromal symptoms.  She continues to have nausea with emesis but diarrhea is improved today.   She has generalized weakness and feels lightheaded when she stands.  She also endorses midsternal chest pain which started late last night, worsened with emesis.  She has shortness of breath but endorses is baseline and chronic from her sarcoidosis and stated pulmonary fibrosis.  She has found no alleviators.  The history is provided by the patient.  Chest Pain   Associated symptoms include nausea, shortness of breath and vomiting. Pertinent negatives include no abdominal pain, no dizziness, no fever, no headaches, no numbness and no weakness.    Past Medical History:  Diagnosis Date  . Anemia   . Avascular bone necrosis ([place])   . Breast fibrocystic disorder   . Breast pain    right  . DOE (dyspnea on exertion)   . Fibromyalgia   . Gastroesophageal reflux disease   . Heart disease   . Mitral valve prolapse   . Normal echocardiogram December 2012   EF 55 to 60%; mild MVP, grade 1 diastolic dysfunction, PAP 32  . Normal nuclear stress test December 2012  . Normal stress echocardiogram Jan 2011  . Pulmonary fibrosis ([place])   . Sarcoidosis [facility])     Patient Active Problem List   Diagnosis Date Noted  . Chronic pain syndrome 04/20/2016  . Vaginal bleeding 01/02/2016  . Acute blood loss anemia 12/31/2015  . Gastroenteritis   . Hypovolemic shock ([place]) 12/30/2015  . Nausea and vomiting 12/29/2015  .  Hypotension 12/29/2015  . Dehydration 12/28/2015  . Constipation 11/14/2015  . Avascular necrosis of bone of left hip ([place]) 04/05/2015  . Bimalleolar fracture of right ankle 02/11/2015  . Post herpetic neuralgia 10/22/2014  . Hyperlipidemia 08/25/2014  . Obstructive sleep apnea 02/18/2014  . Obesity, unspecified 05/06/2013  . Fibromyalgia 02/11/2013  . Depression 02/11/2013  . Abdominal pain, other specified site 02/11/2013  . Shingles 02/02/2013  . Urinary retention 02/02/2013  . Pedal edema 01/22/2013  . Orthostatic hypotension 01/16/2013  . Disturbance of skin sensation 11/26/2012  . Pain in limb 11/26/2012  . Helicobacter pylori gastritis 05/23/2012  . Cough 05/02/2012  . Fibrocystic breast disease in female 04/09/2012  . DOE (dyspnea on exertion) 08/03/2011  . Anxiety state 10/19/2010  . CHEST PAIN 11/22/2008  . Sarcoidosis ([place]) 06/21/2008    Past Surgical History:  Procedure Laterality Date  . BREAST LUMPECTOMY  01/12/2011   right  . TUBAL LIGATION      OB History    Gravida Para Term Preterm AB Living   1 1 1          SAB TAB Ectopic Multiple Live Births                   Home Medications    Prior to Admission medications   Medication Sig Start Date End Date Taking? Authorizing Provider  Blood Pressure Monitoring (SPHYGMOMANOMETER) MISC 1 automatic blood pressure monitor.  Use as directed 01/02/16  Yes [name], MD  budesonide (PULMICORT) 0.5 MG/2ML nebulizer solution Take 0.5 mg by nebulization daily as needed (for shortness of breath).  05/23/15 08/17/16 Yes Historical Provider, MD  diclofenac (VOLTAREN) 75 MG EC tablet Take 75 mg by mouth 2 (two) times daily. 08/08/16 09/07/16 Yes Historical Provider, MD  EPINEPHrine 0.3 mg/0.3 mL IJ SOAJ injection Inject 0.3 mLs (0.3 mg total) into the muscle once. 02/18/14  Yes [name], MD  ferrous sulfate 325 (65 FE) MG EC tablet Take 325 mg by mouth daily.   Yes Historical Provider, MD  ondansetron (ZOFRAN) 8 MG  tablet Take 1 tablet (8 mg total) by mouth every 8 (eight) hours as needed for nausea or vomiting. 08/16/16  Yes Daleen Bo, MD  oxyCODONE (ROXICODONE) 5 MG immediate release tablet Take 1 tablet (5 mg total) by mouth every 6 (six) hours as needed for severe pain. 07/23/16  Yes [name], MD  pantoprazole (PROTONIX) 40 MG tablet TAKE 1 TABLET (40 MG TOTAL) BY MOUTH DAILY. 02/08/16  Yes [name], MD  predniSONE (DELTASONE) 5 MG tablet Take 5 mg by mouth daily with breakfast.   Yes Historical Provider, MD  sertraline (ZOLOFT) 100 MG tablet Take 1 tablet (100 mg total) by mouth daily. 07/05/16 07/05/17 Yes Cloria [place], MD  ALPRAZolam Duanne Moron) 1 MG tablet Take 0.5-1 tablets (0.5-1 mg total) by mouth 3 (three) times daily as needed for anxiety. Patient not taking: Reported on 08/17/2016 07/05/16   Cloria [place], MD  famotidine (PEPCID) 20 MG tablet Take 1 tablet (20 mg total) by mouth 2 (two) times daily. 08/17/16   Evalee Jefferson, PA-C  promethazine (PHENERGAN) 25 MG suppository Place 1 suppository (25 mg total) rectally every 6 (six) hours as needed for nausea or vomiting. 08/17/16   Evalee Jefferson, PA-C    Family History Family History  Problem Relation Age of Onset  . Cardiomyopathy Mother   . Breast cancer Mother   . Prostate cancer Father   . Neurofibromatosis Brother   . Heart disease Maternal Aunt   . Bipolar disorder Daughter   . Colon cancer Neg Hx     Social History Social History  Substance Use Topics  . Smoking status: Never Smoker  . Smokeless tobacco: Never Used  . Alcohol use No     Allergies   Bee venom; Benadryl [diphenhydramine hcl]; Codeine; Diphenhydramine; and Morphine and related   Review of Systems Review of Systems  Constitutional: Negative for fever.  HENT: Negative for congestion and sore throat.   Eyes: Negative.   Respiratory: Positive for shortness of breath. Negative for chest tightness.   Cardiovascular: Positive for chest pain.    Gastrointestinal: Positive for nausea and vomiting. Negative for abdominal distention and abdominal pain.  Genitourinary: Negative.   Musculoskeletal: Negative for arthralgias, joint swelling and neck pain.  Skin: Negative.  Negative for rash and wound.  Neurological: Negative for dizziness, weakness, light-headedness, numbness and headaches.  Psychiatric/Behavioral: Negative.      Physical Exam Updated Vital Signs BP 112/69   Pulse 65   Temp 97.9 F (36.6 C) (Oral)   Resp 23   Ht 5\' 10"  (1.778 m)   Wt 90.7 kg   LMP 05/17/2016   SpO2 100%   BMI 28.70 kg/m   Physical Exam  Constitutional: She appears well-developed and well-nourished.  HENT:  Head: Normocephalic and atraumatic.  Eyes: Conjunctivae are normal.  Neck: Normal range of motion.  Cardiovascular: Normal  rate, regular rhythm, normal heart sounds and intact distal pulses.   Pulmonary/Chest: Effort normal and breath sounds normal. She has no wheezes.  Abdominal: Soft. Bowel sounds are normal. There is no tenderness.  Musculoskeletal: Normal range of motion.  Neurological: She is alert.  Skin: Skin is warm and dry.  Psychiatric: She has a normal mood and affect.  Nursing note and vitals reviewed.    ED Treatments / Results  Labs (all labs ordered are listed, but only abnormal results are displayed) Labs Reviewed  CBC WITH DIFFERENTIAL/PLATELET - Abnormal; Notable for the following:       Result Value   Hemoglobin 11.7 (*)    HCT 35.2 (*)    Lymphs Abs 0.6 (*)    All other components within normal limits  COMPREHENSIVE METABOLIC PANEL - Abnormal; Notable for the following:    Potassium 3.4 (*)    Chloride 112 (*)    Calcium 8.8 (*)    Total Protein 6.1 (*)    Albumin 3.3 (*)    AST 13 (*)    Anion gap 4 (*)    All other components within normal limits  TROPONIN I  LIPASE, BLOOD    EKG ED ECG REPORT   Date: 08/17/2016  Rate: 67  Rhythm: normal sinus rhythm  QRS Axis: normal  Intervals:  normal  ST/T Wave abnormalities: nonspecific T wave changes  Conduction Disutrbances:none  Narrative Interpretation:   Old EKG Reviewed: unchanged  I have personally reviewed the EKG tracing and agree with the computerized printout as noted.   Radiology No results found.  Procedures Procedures (including critical care time)  Medications Ordered in ED Medications  sodium chloride 0.9 % bolus 1,000 mL (0 mLs Intravenous Stopped 08/17/16 1147)  prochlorperazine (COMPAZINE) injection 10 mg (10 mg Intravenous Given 08/17/16 1049)  fentaNYL (SUBLIMAZE) injection 100 mcg (100 mcg Intravenous Given 08/17/16 1049)  ketorolac (TORADOL) 30 MG/ML injection 15 mg (15 mg Intravenous Given 08/17/16 1050)  sodium chloride 0.9 % bolus 1,000 mL (1,000 mLs Intravenous New Bag/Given 08/17/16 1517)  promethazine (PHENERGAN) tablet 25 mg (25 mg Oral Given 08/17/16 1614)  hydrocortisone sodium succinate (SOLU-CORTEF) 100 MG injection 100 mg (100 mg Intravenous Given 08/17/16 1614)     Initial Impression / Assessment and Plan / ED Course  I have reviewed the triage vital signs and the nursing notes.  Pertinent labs & imaging results that were available during my care of the patient were reviewed by me and considered in my medical decision making (see chart for details).  Clinical Course     Pt given IV fluids and migraine cocktail, mild emesis remained but no emesis and pt tolerated PO intake.  Her headache was improved after receiving compazine, fentanyl and toradol.  She has no focal neuro findings on exam today and she was ambulatory to the bathroom without dizziness,  Just felt "weak".  Orthostatics tilt positive.  Additional NS liter given prior to dc home.    Pt's bp were soft during this visit, historically this has been the case, not profoundly different today.  She takes prednisone 5 mg daily but unable to tolerate PO intake since ytd.  Was given solu cortef injection ytd.  Describes burning  chest pain, worsened with emesis, suspect gastritis, trop negative, ekg negative for acute changes.  Prior to dc, pt bp on monitor was 75/37, still with mild nausea, but tolerating PO intake.  She was given another dose of solu cortef here.    Pt seen  by Dr. Eulis Foster who recommends admission and will arrange.  Call from Dr. Nehemiah Settle - overnight obs admission.  Final Clinical Impressions(s) / ED Diagnoses   Final diagnoses:  Nausea vomiting and diarrhea  Other migraine without status migrainosus, not intractable  Adrenal insufficiency (Addison's disease) (HCC)    New Prescriptions New Prescriptions   FAMOTIDINE (PEPCID) 20 MG TABLET    Take 1 tablet (20 mg total) by mouth 2 (two) times daily.   PROMETHAZINE (PHENERGAN) 25 MG SUPPOSITORY    Place 1 suppository (25 mg total) rectally every 6 (six) hours as needed for nausea or vomiting.     Evalee Jefferson, PA-C 08/17/16 1546    Evalee Jefferson, PA-C 08/17/16 1642    Evalee Jefferson, PA-C 08/17/16 1707    Daleen Bo, MD 08/22/16 [phone]

## 2016-08-18 ENCOUNTER — Encounter (HOSPITAL_COMMUNITY): Payer: Self-pay | Admitting: Internal Medicine

## 2016-08-18 DIAGNOSIS — R570 Cardiogenic shock: Secondary | ICD-10-CM | POA: Diagnosis not present

## 2016-08-18 DIAGNOSIS — J90 Pleural effusion, not elsewhere classified: Secondary | ICD-10-CM | POA: Diagnosis not present

## 2016-08-18 DIAGNOSIS — A084 Viral intestinal infection, unspecified: Secondary | ICD-10-CM | POA: Diagnosis present

## 2016-08-18 DIAGNOSIS — I272 Pulmonary hypertension, unspecified: Secondary | ICD-10-CM | POA: Diagnosis present

## 2016-08-18 DIAGNOSIS — E272 Addisonian crisis: Secondary | ICD-10-CM | POA: Diagnosis present

## 2016-08-18 DIAGNOSIS — D869 Sarcoidosis, unspecified: Secondary | ICD-10-CM | POA: Diagnosis not present

## 2016-08-18 DIAGNOSIS — J9600 Acute respiratory failure, unspecified whether with hypoxia or hypercapnia: Secondary | ICD-10-CM | POA: Diagnosis not present

## 2016-08-18 DIAGNOSIS — I959 Hypotension, unspecified: Secondary | ICD-10-CM | POA: Diagnosis not present

## 2016-08-18 DIAGNOSIS — K219 Gastro-esophageal reflux disease without esophagitis: Secondary | ICD-10-CM | POA: Diagnosis present

## 2016-08-18 DIAGNOSIS — R6521 Severe sepsis with septic shock: Secondary | ICD-10-CM | POA: Diagnosis not present

## 2016-08-18 DIAGNOSIS — I5021 Acute systolic (congestive) heart failure: Secondary | ICD-10-CM | POA: Diagnosis not present

## 2016-08-18 DIAGNOSIS — R918 Other nonspecific abnormal finding of lung field: Secondary | ICD-10-CM | POA: Diagnosis not present

## 2016-08-18 DIAGNOSIS — I400 Infective myocarditis: Secondary | ICD-10-CM | POA: Diagnosis present

## 2016-08-18 DIAGNOSIS — D860 Sarcoidosis of lung: Secondary | ICD-10-CM | POA: Diagnosis present

## 2016-08-18 DIAGNOSIS — E871 Hypo-osmolality and hyponatremia: Secondary | ICD-10-CM | POA: Diagnosis not present

## 2016-08-18 DIAGNOSIS — Z8249 Family history of ischemic heart disease and other diseases of the circulatory system: Secondary | ICD-10-CM | POA: Diagnosis not present

## 2016-08-18 DIAGNOSIS — R0902 Hypoxemia: Secondary | ICD-10-CM | POA: Diagnosis not present

## 2016-08-18 DIAGNOSIS — R112 Nausea with vomiting, unspecified: Secondary | ICD-10-CM | POA: Diagnosis not present

## 2016-08-18 DIAGNOSIS — I499 Cardiac arrhythmia, unspecified: Secondary | ICD-10-CM | POA: Diagnosis not present

## 2016-08-18 DIAGNOSIS — B3324 Viral cardiomyopathy: Secondary | ICD-10-CM | POA: Diagnosis not present

## 2016-08-18 DIAGNOSIS — Z7952 Long term (current) use of systemic steroids: Secondary | ICD-10-CM | POA: Diagnosis not present

## 2016-08-18 DIAGNOSIS — J811 Chronic pulmonary edema: Secondary | ICD-10-CM | POA: Diagnosis not present

## 2016-08-18 DIAGNOSIS — J9621 Acute and chronic respiratory failure with hypoxia: Secondary | ICD-10-CM | POA: Diagnosis not present

## 2016-08-18 DIAGNOSIS — Z4682 Encounter for fitting and adjustment of non-vascular catheter: Secondary | ICD-10-CM | POA: Diagnosis not present

## 2016-08-18 DIAGNOSIS — M797 Fibromyalgia: Secondary | ICD-10-CM | POA: Diagnosis present

## 2016-08-18 DIAGNOSIS — R001 Bradycardia, unspecified: Secondary | ICD-10-CM | POA: Diagnosis not present

## 2016-08-18 DIAGNOSIS — R0600 Dyspnea, unspecified: Secondary | ICD-10-CM | POA: Diagnosis not present

## 2016-08-18 DIAGNOSIS — A419 Sepsis, unspecified organism: Secondary | ICD-10-CM | POA: Diagnosis not present

## 2016-08-18 DIAGNOSIS — I248 Other forms of acute ischemic heart disease: Secondary | ICD-10-CM | POA: Diagnosis not present

## 2016-08-18 DIAGNOSIS — Z452 Encounter for adjustment and management of vascular access device: Secondary | ICD-10-CM | POA: Diagnosis not present

## 2016-08-18 DIAGNOSIS — I4891 Unspecified atrial fibrillation: Secondary | ICD-10-CM | POA: Diagnosis not present

## 2016-08-18 DIAGNOSIS — J189 Pneumonia, unspecified organism: Secondary | ICD-10-CM | POA: Diagnosis not present

## 2016-08-18 DIAGNOSIS — J8410 Pulmonary fibrosis, unspecified: Secondary | ICD-10-CM | POA: Diagnosis present

## 2016-08-18 DIAGNOSIS — N179 Acute kidney failure, unspecified: Secondary | ICD-10-CM | POA: Diagnosis not present

## 2016-08-18 DIAGNOSIS — E271 Primary adrenocortical insufficiency: Secondary | ICD-10-CM | POA: Diagnosis not present

## 2016-08-18 DIAGNOSIS — I480 Paroxysmal atrial fibrillation: Secondary | ICD-10-CM | POA: Diagnosis not present

## 2016-08-18 DIAGNOSIS — I340 Nonrheumatic mitral (valve) insufficiency: Secondary | ICD-10-CM | POA: Diagnosis not present

## 2016-08-18 DIAGNOSIS — R578 Other shock: Secondary | ICD-10-CM | POA: Diagnosis not present

## 2016-08-18 DIAGNOSIS — J984 Other disorders of lung: Secondary | ICD-10-CM | POA: Diagnosis not present

## 2016-08-18 DIAGNOSIS — J9601 Acute respiratory failure with hypoxia: Secondary | ICD-10-CM | POA: Diagnosis not present

## 2016-08-18 DIAGNOSIS — E669 Obesity, unspecified: Secondary | ICD-10-CM | POA: Diagnosis present

## 2016-08-18 DIAGNOSIS — R0602 Shortness of breath: Secondary | ICD-10-CM | POA: Diagnosis not present

## 2016-08-18 DIAGNOSIS — E876 Hypokalemia: Secondary | ICD-10-CM | POA: Diagnosis present

## 2016-08-18 DIAGNOSIS — Z803 Family history of malignant neoplasm of breast: Secondary | ICD-10-CM | POA: Diagnosis not present

## 2016-08-18 LAB — BASIC METABOLIC PANEL
Anion gap: 4 — ABNORMAL LOW (ref 5–15)
BUN: <5 mg/dL — ABNORMAL LOW (ref 6–20)
CO2: 20 mmol/L — ABNORMAL LOW (ref 22–32)
Calcium: 7.6 mg/dL — ABNORMAL LOW (ref 8.9–10.3)
Chloride: 118 mmol/L — ABNORMAL HIGH (ref 101–111)
Creatinine, Ser: 0.6 mg/dL (ref 0.44–1.00)
GFR calc Af Amer: >60 mL/min (ref >60)
GFR calc non Af Amer: >60 mL/min (ref >60)
Glucose, Bld: 104 mg/dL — ABNORMAL HIGH (ref 65–99)
Potassium: 4.2 mmol/L (ref 3.5–5.1)
Sodium: 142 mmol/L (ref 135–145)

## 2016-08-18 LAB — COMPREHENSIVE METABOLIC PANEL
ALT: 14 U/L (ref 14–54)
AST: 24 U/L (ref 15–41)
Albumin: 3.1 g/dL — ABNORMAL LOW (ref 3.5–5.0)
Alkaline Phosphatase: 48 U/L (ref 38–126)
Anion gap: 6 (ref 5–15)
BUN: <5 mg/dL — ABNORMAL LOW (ref 6–20)
CO2: 17 mmol/L — ABNORMAL LOW (ref 22–32)
Calcium: 7.8 mg/dL — ABNORMAL LOW (ref 8.9–10.3)
Chloride: 117 mmol/L — ABNORMAL HIGH (ref 101–111)
Creatinine, Ser: 0.71 mg/dL (ref 0.44–1.00)
GFR calc Af Amer: >60 mL/min (ref >60)
GFR calc non Af Amer: >60 mL/min (ref >60)
Glucose, Bld: 168 mg/dL — ABNORMAL HIGH (ref 65–99)
Potassium: 4 mmol/L (ref 3.5–5.1)
Sodium: 140 mmol/L (ref 135–145)
Total Bilirubin: 0.3 mg/dL (ref 0.3–1.2)
Total Protein: 5.8 g/dL — ABNORMAL LOW (ref 6.5–8.1)

## 2016-08-18 LAB — C DIFFICILE QUICK SCREEN W PCR REFLEX
C Diff antigen: NEGATIVE
C Diff interpretation: NOT DETECTED
C Diff toxin: NEGATIVE

## 2016-08-18 LAB — TSH: TSH: 0.463 u[IU]/mL (ref 0.350–4.500)

## 2016-08-18 MED ORDER — CALCIUM GLUCONATE 10 % IV SOLN
1.0000 g | Freq: Once | INTRAVENOUS | Status: AC
  Administered 2016-08-18: 1 g via INTRAVENOUS
  Filled 2016-08-18: qty 10

## 2016-08-18 MED ORDER — HYDROCORTISONE NA SUCCINATE PF 100 MG IJ SOLR
100.0000 mg | Freq: Once | INTRAMUSCULAR | Status: AC
Start: 2016-08-18 — End: 2016-08-18
  Administered 2016-08-18: 100 mg via INTRAVENOUS

## 2016-08-18 MED ORDER — PREDNISONE 20 MG PO TABS: 20.0000 mg | ORAL_TABLET | Freq: Every day | ORAL | Status: DC

## 2016-08-18 MED ORDER — SODIUM CHLORIDE 0.9 % IV BOLUS (SEPSIS): 500.0000 mL | INTRAVENOUS | Status: DC | PRN

## 2016-08-18 MED ORDER — HYDROCORTISONE NA SUCCINATE PF 100 MG IJ SOLR
100.0000 mg | Freq: Three times a day (TID) | INTRAMUSCULAR | Status: DC
  Administered 2016-08-18 – 2016-08-19 (×6): 100 mg via INTRAVENOUS
  Filled 2016-08-18 (×6): qty 2

## 2016-08-18 MED ORDER — PHENAZOPYRIDINE HCL 100 MG PO TABS
100.0000 mg | ORAL_TABLET | Freq: Three times a day (TID) | ORAL | Status: DC
  Administered 2016-08-18 – 2016-08-19 (×3): 100 mg via ORAL
  Filled 2016-08-18 (×10): qty 1

## 2016-08-18 MED ORDER — POTASSIUM CHLORIDE IN NACL 40-0.9 MEQ/L-% IV SOLN
INTRAVENOUS | Status: AC
  Administered 2016-08-18: 50 mL/h via INTRAVENOUS

## 2016-08-18 MED ORDER — MIDODRINE HCL 5 MG PO TABS
10.0000 mg | ORAL_TABLET | Freq: Three times a day (TID) | ORAL | Status: DC
  Administered 2016-08-18: 10 mg via ORAL
  Filled 2016-08-18: qty 2

## 2016-08-18 MED ORDER — BUDESONIDE 0.5 MG/2ML IN SUSP
0.5000 mg | Freq: Two times a day (BID) | RESPIRATORY_TRACT | Status: DC
  Administered 2016-08-18 – 2016-08-21 (×5): 0.5 mg via RESPIRATORY_TRACT
  Filled 2016-08-18 (×8): qty 2

## 2016-08-18 MED ORDER — SODIUM CHLORIDE 0.9 % IV BOLUS (SEPSIS)
1000.0000 mL | Freq: Once | INTRAVENOUS | Status: AC
  Administered 2016-08-18: 1000 mL via INTRAVENOUS

## 2016-08-18 MED ORDER — TEMAZEPAM 15 MG PO CAPS
15.0000 mg | ORAL_CAPSULE | Freq: Every evening | ORAL | Status: DC | PRN
  Administered 2016-08-18: 15 mg via ORAL
  Filled 2016-08-18: qty 1

## 2016-08-18 MED ORDER — ALBUTEROL SULFATE (2.5 MG/3ML) 0.083% IN NEBU
2.5000 mg | INHALATION_SOLUTION | RESPIRATORY_TRACT | Status: DC | PRN
  Administered 2016-08-19 – 2016-08-21 (×2): 2.5 mg via RESPIRATORY_TRACT
  Filled 2016-08-18 (×2): qty 3

## 2016-08-18 NOTE — Progress Notes (Signed)
MD notified of hr in low 40s to mid 30s and of sbp in the 70s. New orders received and noted

## 2016-08-18 NOTE — Progress Notes (Signed)
Patient ID: Kathleen [name], female   DOB: [birth year], 55 y.o.   MRN: [id number] I saw the patient earlier due to sinus bradycardia in the high 30s and low 40s. The patient has good chronotropic response with activity and her HR has been in the 70's and 80's earlier. I told her that since it is sinus bradycardia and not a heart block.  She does not need to be transferred immediately to [facility] for this, but I reassured her that we will be monitored closely for any changes. She was not very interested in a cardiology evaluation in AM and would prefer cardiology evaluation in [place].   She also complaints of transient postural dizziness, when getting up from bed and feels dehydrated. Advised to call the staff to get out of bed and will give a NS 1,000 mL bolus.  Tennis Must, MD [phone].

## 2016-08-18 NOTE — Progress Notes (Addendum)
PROGRESS NOTE                                                                                                                                                                                                             Patient Demographics:    Kathleen [name], is a 55 y.o. female, DOB - [birth year], [id number]  Admit date - 08/17/2016   Admitting Physician Truett Mainland, DO  Outpatient Primary MD for the patient is Sallee Lange, MD  LOS - 0  Chief Complaint  Patient presents with  . Emesis  . Chest Pain       Brief Narrative   Kathleen [name] is a 55 y.o. female with a history of gastroesophageal reflux disease, fibromyalgia, sarcoidosis with pulmonary fibrosisOn chronic prednisone. Patient seen for nausea, vomiting, diarrhea for 3 days. Patient was seen in the emergency department yesterday, was fluid hydrated and given a dose of Solu-Cortef 100 mg IV. Patient has been unable to take her prednisone answer symptoms began. Patient has been intolerant of oral food and liquid as these make her symptoms worse. She briefly improved after the fluids and steroids yesterday, but her symptoms returned   Subjective:    Kathleen [name] today has, No headache, No chest pain, No abdominal pain - No Nausea, No new weakness tingling or numbness, No Cough - SOB.    Assessment  & Plan :     1.Nausea vomiting, hypotension due to adrenal insufficiency in a patient who is chronically on prednisone but was unable to take it due to gastroenteritis. Gastroenteritis symptoms have improved, she has been adequately hydrated, on IV hydrocortisone with stable blood pressures. Symptom-free at this time. Ambulating in the hallway without any symptoms. We will monitor her on gentle IV fluids for 24 hours if stable discharge home tomorrow.  2. Gastroenteritis. No emesis, stool mildly soft, continue bowel rest and monitor. No further emesis.  3.  Sarcoidosis with pulmonary fibrosis. On chronic steroids, currently on stress dose and monitor. Thereafter outpatient pulmonary and rheumatology follow-up. Continue supportive care.  4. Sinus bradycardia when she is in bed and sleeping. Excellent chronotropic response upon sitting up and walking without any symptoms, EKG is stable, stable TSH.  5. GERD. On PPI     Family Communication  :  None  Code Status :  Full  Diet :  Diet clear liquid Room service appropriate? Yes; Fluid consistency: Thin   Disposition Plan  :  Tele  Consults  :  None  Procedures  :  None  DVT Prophylaxis  :  Lovenox    Lab Results  Component Value Date   PLT 305 08/17/2016    Inpatient Medications  Scheduled Meds: . budesonide  0.5 mg Nebulization BID  . enoxaparin (LOVENOX) injection  40 mg Subcutaneous Q24H  . ferrous sulfate  325 mg Oral Daily  . hydrocortisone sod succinate (SOLU-CORTEF) inj  100 mg Intravenous Q8H  . pantoprazole  40 mg Oral BID  . sertraline  100 mg Oral Daily   Continuous Infusions: . 0.9 % NaCl with KCl 40 mEq / L     PRN Meds:.acetaminophen **OR** acetaminophen, albuterol, [DISCONTINUED] ondansetron **OR** ondansetron (ZOFRAN) IV, sodium chloride  Antibiotics  :    Anti-infectives    None         Objective:   Vitals:   08/18/16 0800 08/18/16 0805 08/18/16 0830 08/18/16 0900  BP: (!) 100/45  108/60 (!) 110/53  Pulse: 68  73 (!) 44  Resp: (!) 23  (!) 21 (!) 24  Temp:      TempSrc:      SpO2: 91% 93% 94% 96%  Weight:      Height:        Wt Readings from Last 3 Encounters:  08/18/16 99.6 kg (219 lb 9.3 oz)  08/16/16 90.7 kg (200 lb)  01/02/16 100.2 kg (220 lb 14.4 oz)     Intake/Output Summary (Last 24 hours) at 08/18/16 0934 Last data filed at 08/18/16 0843  Gross per 24 hour  Intake             2065 ml  Output                0 ml  Net             2065 ml     Physical Exam  Awake Alert, Oriented X 3, No new F.N deficits, Normal  affect [place].AT,PERRAL Supple Neck,No JVD, No cervical lymphadenopathy appriciated.  Symmetrical Chest wall movement, Good air movement bilaterally, CTAB RRR,No Gallops,Rubs or new Murmurs, No Parasternal Heave +ve B.Sounds, Abd Soft, No tenderness, No organomegaly appriciated, No rebound - guarding or rigidity. No Cyanosis, Clubbing or edema, No new Rash or bruise       Data Review:    CBC  Recent Labs Lab 08/16/16 1316 08/17/16 1055  WBC 6.5 5.0  HGB 13.2 11.7*  HCT 39.6 35.2*  PLT 320 305  MCV 87.2 87.3  MCH 29.1 29.0  MCHC 33.3 33.2  RDW 14.3 14.1  LYMPHSABS  --  0.6*  MONOABS  --  0.5  EOSABS  --  0.1  BASOSABS  --  0.0    Chemistries   Recent Labs Lab 08/16/16 1316 08/17/16 1055 08/18/16 0519  NA 140 141 142  K 4.5 3.4* 4.2  CL 108 112* 118*  CO2 26 25 20*  GLUCOSE 100* 87 104*  BUN 8 6 <5*  CREATININE 0.77 0.84 0.60  CALCIUM 9.8 8.8* 7.6*  AST 17 13*  --   ALT 15 14  --   ALKPHOS 70 54  --   BILITOT 0.6 0.4  --    ------------------------------------------------------------------------------------------------------------------ No results for input(s): CHOL, HDL, LDLCALC, TRIG, CHOLHDL, LDLDIRECT in the last 72 hours.  No results found for: HGBA1C ------------------------------------------------------------------------------------------------------------------ No results for input(s): TSH, T4TOTAL, T3FREE, THYROIDAB in  the last 72 hours.  Invalid input(s): FREET3 ------------------------------------------------------------------------------------------------------------------ No results for input(s): VITAMINB12, FOLATE, FERRITIN, TIBC, IRON, RETICCTPCT in the last 72 hours.  Coagulation profile No results for input(s): INR, PROTIME in the last 168 hours.  No results for input(s): DDIMER in the last 72 hours.  Cardiac Enzymes  Recent Labs Lab 08/17/16 1055  TROPONINI <0.03    ------------------------------------------------------------------------------------------------------------------ No results found for: BNP  Micro Results Recent Results (from the past 240 hour(s))  MRSA PCR Screening     Status: None   Collection Time: 08/17/16  7:18 PM  Result Value Ref Range Status   MRSA by PCR NEGATIVE NEGATIVE Final    Comment:        The GeneXpert MRSA Assay (FDA approved for NASAL specimens only), is one component of a comprehensive MRSA colonization surveillance program. It is not intended to diagnose MRSA infection nor to guide or monitor treatment for MRSA infections.     Radiology Reports No results found.  Time Spent in minutes  30   [name] Lund K M.D on 08/18/2016 at 9:34 AM  Between 7am to 7pm - Pager - [phone]  After 7pm go to [email or web address] - password [email or web address]  [facility] -  Office  [phone]

## 2016-08-18 NOTE — Progress Notes (Signed)
Patient Heart rate still slow 37. Patient is concerned that she should  Leave and be moved to [facility]. For hr rate.

## 2016-08-18 NOTE — Progress Notes (Signed)
Patient transferred to medical surgical unit in stable condition in wheelchair. IV patient and infusing as ordered. Ambulated to bed x 1 assist. Call bell within reach. Unit made aware, report given to [facility] RN

## 2016-08-19 ENCOUNTER — Inpatient Hospital Stay (HOSPITAL_COMMUNITY): Payer: 59

## 2016-08-19 ENCOUNTER — Encounter (HOSPITAL_COMMUNITY): Payer: Self-pay | Admitting: Cardiology

## 2016-08-19 DIAGNOSIS — I400 Infective myocarditis: Secondary | ICD-10-CM | POA: Diagnosis not present

## 2016-08-19 DIAGNOSIS — J9620 Acute and chronic respiratory failure, unspecified whether with hypoxia or hypercapnia: Secondary | ICD-10-CM

## 2016-08-19 DIAGNOSIS — R6521 Severe sepsis with septic shock: Secondary | ICD-10-CM | POA: Diagnosis not present

## 2016-08-19 DIAGNOSIS — R001 Bradycardia, unspecified: Secondary | ICD-10-CM

## 2016-08-19 DIAGNOSIS — J9621 Acute and chronic respiratory failure with hypoxia: Secondary | ICD-10-CM | POA: Diagnosis not present

## 2016-08-19 DIAGNOSIS — I272 Pulmonary hypertension, unspecified: Secondary | ICD-10-CM | POA: Diagnosis not present

## 2016-08-19 DIAGNOSIS — J9601 Acute respiratory failure with hypoxia: Secondary | ICD-10-CM

## 2016-08-19 DIAGNOSIS — D869 Sarcoidosis, unspecified: Secondary | ICD-10-CM

## 2016-08-19 DIAGNOSIS — A419 Sepsis, unspecified organism: Secondary | ICD-10-CM | POA: Diagnosis not present

## 2016-08-19 DIAGNOSIS — I5021 Acute systolic (congestive) heart failure: Secondary | ICD-10-CM

## 2016-08-19 DIAGNOSIS — I959 Hypotension, unspecified: Secondary | ICD-10-CM

## 2016-08-19 DIAGNOSIS — R0600 Dyspnea, unspecified: Secondary | ICD-10-CM

## 2016-08-19 DIAGNOSIS — J8410 Pulmonary fibrosis, unspecified: Secondary | ICD-10-CM | POA: Diagnosis not present

## 2016-08-19 DIAGNOSIS — E272 Addisonian crisis: Secondary | ICD-10-CM | POA: Diagnosis not present

## 2016-08-19 DIAGNOSIS — B3324 Viral cardiomyopathy: Secondary | ICD-10-CM

## 2016-08-19 DIAGNOSIS — J189 Pneumonia, unspecified organism: Secondary | ICD-10-CM | POA: Diagnosis not present

## 2016-08-19 LAB — ECHOCARDIOGRAM COMPLETE
E decel time: 197 msec
E/e' ratio: 8.72
FS: 21 % — AB (ref 28–44)
Height: 70 in
IVS/LV PW RATIO, ED: 0.96
LA ID, A-P, ES: 37 mm
LA diam end sys: 37 mm
LA diam index: 1.62 cm/m2
LA vol A4C: 45.9 ml
LA vol index: 24.2 mL/m2
LA vol: 55.1 mL
LV E/e' medial: 8.72
LV E/e'average: 8.72
LV PW d: 13.6 mm — AB (ref 0.6–1.1)
LV e' LATERAL: 10.4 cm/s
LVOT area: 3.8 cm2
LVOT diameter: 22 mm
Lateral S' vel: 11.6 cm/s
MV Dec: 197
MV Peak grad: 3 mmHg
MV pk A vel: 52.4 m/s
MV pk E vel: 90.7 m/s
RV sys press: 47 mmHg
Reg peak vel: 314 cm/s
TAPSE: 16.1 mm
TDI e' lateral: 10.4
TDI e' medial: 7.07
TR max vel: 314 cm/s
Weight: 3615.54 oz

## 2016-08-19 LAB — BLOOD GAS, ARTERIAL
Acid-base deficit: 0.7 mmol/L (ref 0.0–2.0)
Bicarbonate: 23.6 mmol/L (ref 20.0–28.0)
Drawn by: 257081
O2 Content: 2 L/min
O2 Saturation: 94.1 %
Patient temperature: 97.6
pCO2 arterial: 39 mmHg (ref 32.0–48.0)
pH, Arterial: 7.396 (ref 7.350–7.450)
pO2, Arterial: 82.9 mmHg — ABNORMAL LOW (ref 83.0–108.0)

## 2016-08-19 LAB — BRAIN NATRIURETIC PEPTIDE: B Natriuretic Peptide: 382.4 pg/mL — ABNORMAL HIGH (ref 0.0–100.0)

## 2016-08-19 LAB — BASIC METABOLIC PANEL
Anion gap: 4 — ABNORMAL LOW (ref 5–15)
BUN: 5 mg/dL — ABNORMAL LOW (ref 6–20)
CO2: 20 mmol/L — ABNORMAL LOW (ref 22–32)
Calcium: 8.4 mg/dL — ABNORMAL LOW (ref 8.9–10.3)
Chloride: 115 mmol/L — ABNORMAL HIGH (ref 101–111)
Creatinine, Ser: 0.66 mg/dL (ref 0.44–1.00)
GFR calc Af Amer: >60 mL/min (ref >60)
GFR calc non Af Amer: >60 mL/min (ref >60)
Glucose, Bld: 104 mg/dL — ABNORMAL HIGH (ref 65–99)
Potassium: 3.7 mmol/L (ref 3.5–5.1)
Sodium: 139 mmol/L (ref 135–145)

## 2016-08-19 LAB — MAGNESIUM: Magnesium: 1.6 mg/dL — ABNORMAL LOW (ref 1.7–2.4)

## 2016-08-19 LAB — TROPONIN I: Troponin I: <0.03 ng/mL (ref <0.03)

## 2016-08-19 MED ORDER — FUROSEMIDE 10 MG/ML IJ SOLN
20.0000 mg | Freq: Once | INTRAMUSCULAR | Status: AC
  Administered 2016-08-19: 20 mg via INTRAVENOUS
  Filled 2016-08-19: qty 2

## 2016-08-19 MED ORDER — LOPERAMIDE HCL 2 MG PO CAPS
2.0000 mg | ORAL_CAPSULE | Freq: Four times a day (QID) | ORAL | Status: DC | PRN
  Administered 2016-08-19: 2 mg via ORAL
  Filled 2016-08-19: qty 1

## 2016-08-19 MED ORDER — FUROSEMIDE 10 MG/ML IJ SOLN
20.0000 mg | Freq: Four times a day (QID) | INTRAMUSCULAR | Status: DC
  Administered 2016-08-19 – 2016-08-20 (×4): 20 mg via INTRAVENOUS
  Filled 2016-08-19 (×4): qty 2

## 2016-08-19 MED ORDER — DOPAMINE-DEXTROSE 3.2-5 MG/ML-% IV SOLN
0.0000 ug/kg/min | INTRAVENOUS | Status: DC
  Administered 2016-08-19: 5 ug/kg/min via INTRAVENOUS
  Administered 2016-08-20: 18 ug/kg/min via INTRAVENOUS
  Administered 2016-08-20: 15 ug/kg/min via INTRAVENOUS
  Filled 2016-08-19 (×2): qty 250

## 2016-08-19 MED ORDER — DOPAMINE-DEXTROSE 3.2-5 MG/ML-% IV SOLN
INTRAVENOUS | Status: AC
  Filled 2016-08-19: qty 250

## 2016-08-19 MED ORDER — MAGNESIUM SULFATE 2 GM/50ML IV SOLN
2.0000 g | Freq: Once | INTRAVENOUS | Status: DC
  Filled 2016-08-19: qty 50

## 2016-08-19 MED ORDER — PROMETHAZINE HCL 25 MG RE SUPP
25.0000 mg | Freq: Four times a day (QID) | RECTAL | Status: DC | PRN
  Administered 2016-08-19: 25 mg via RECTAL
  Filled 2016-08-19: qty 1

## 2016-08-19 MED ORDER — PROCHLORPERAZINE EDISYLATE 5 MG/ML IJ SOLN
10.0000 mg | Freq: Three times a day (TID) | INTRAMUSCULAR | Status: AC | PRN
  Administered 2016-08-20 (×2): 10 mg via INTRAVENOUS
  Filled 2016-08-19 (×4): qty 2

## 2016-08-19 MED ORDER — POTASSIUM CHLORIDE CRYS ER 20 MEQ PO TBCR
20.0000 meq | EXTENDED_RELEASE_TABLET | Freq: Once | ORAL | Status: AC
  Administered 2016-08-19: 20 meq via ORAL
  Filled 2016-08-19: qty 1

## 2016-08-19 NOTE — Progress Notes (Signed)
Patient was complaining about HR being low when I arrived on unit for my shift. Called MD and made aware of her concerns. Patient and family wanted to transfer to [facility] if nothing was going to be done here. MD advised patient that all her test came back normal. MD ordered 1034ml bolus and stopped NS with 40mg  of Potassium. Patient resting at this time

## 2016-08-19 NOTE — Progress Notes (Signed)
0804 Patient was ambulated w/o O2 2L [place], O2 SAT dropped to 83%, HR 68, BP 98/65. Patient c/o feeling dizzy and lightheaded and requested to return back to bed. O2 2L [place] reapplied and patient's O2 SAT increased to 96%. Patient encouraged to sit up in chair but refused at this time. Will continue to monitor.

## 2016-08-19 NOTE — Consult Note (Signed)
Name: Kathleen [name] MRN: [id number] DOB: [birth year]    ADMISSION DATE:  08/17/2016 CONSULTATION DATE:  08/19/16  REFERRING MD :  Dr. Candiss [name]   CHIEF COMPLAINT:  Weakness / Bradycardia    HISTORY OF PRESENT ILLNESS:  55 y/o F with PMH of anemia, fibromyalgia, GERD, mitral valve prolapse, sarcoidosis and pulmonary fibrosis on chronic prednisone who was admitted to [facility] on 12/22 for gastroenteritis (nausea / vomiting and poor PO intake) which led to mild adrenal crisis as she could not take PO prednisone.  She was stabilized with IV fluids and steroids.  C-Diff was ruled out.  She was found to have bradycardia and requested transfer to [facility] for evaluation by Cardiology.  She was also noted to have a new O2 requirement of 2L.   CXR showed mild edema superimposed on IPF.  She was treated with IV lasix with improvement in respiratory status.  The patient was also treated with dopamine for bradycardia.    PCCM called for evaluation of hypoxia.    PAST MEDICAL HISTORY :   has a past medical history of Anemia; Avascular bone necrosis ([place]); Breast fibrocystic disorder; Breast pain; DOE (dyspnea on exertion); Fibromyalgia; Gastroesophageal reflux disease; Heart disease; Mitral valve prolapse; Normal echocardiogram (December 2012); Normal nuclear stress test (December 2012); Normal stress echocardiogram (Jan 2011); Pulmonary fibrosis ([place]); and Sarcoidosis ([place]).   has a past surgical history that includes Tubal ligation and Breast lumpectomy (01/12/2011).  Prior to Admission medications   Medication Sig Start Date End Date Taking? Authorizing Provider  Blood Pressure Monitoring (SPHYGMOMANOMETER) MISC 1 automatic blood pressure monitor. Use as directed 01/02/16  Yes Kathleen Dike, MD  budesonide (PULMICORT) 0.5 MG/2ML nebulizer solution Take 0.5 mg by nebulization daily as needed (for shortness of breath).  05/23/15 08/17/16 Yes Historical Provider, MD  diclofenac (VOLTAREN) 75 MG  EC tablet Take 75 mg by mouth 2 (two) times daily. 08/08/16 09/07/16 Yes Historical Provider, MD  EPINEPHrine 0.3 mg/0.3 mL IJ SOAJ injection Inject 0.3 mLs (0.3 mg total) into the muscle once. 02/18/14  Yes Kathleen Drown, MD  ferrous sulfate 325 (65 FE) MG EC tablet Take 325 mg by mouth daily.   Yes Historical Provider, MD  ondansetron (ZOFRAN) 8 MG tablet Take 1 tablet (8 mg total) by mouth every 8 (eight) hours as needed for nausea or vomiting. 08/16/16  Yes Kathleen Bo, MD  oxyCODONE (ROXICODONE) 5 MG immediate release tablet Take 1 tablet (5 mg total) by mouth every 6 (six) hours as needed for severe pain. 07/23/16  Yes Kathleen Drown, MD  pantoprazole (PROTONIX) 40 MG tablet TAKE 1 TABLET (40 MG TOTAL) BY MOUTH DAILY. 02/08/16  Yes Kathleen Drown, MD  predniSONE (DELTASONE) 5 MG tablet Take 5 mg by mouth daily with breakfast.   Yes Historical Provider, MD  sertraline (ZOLOFT) 100 MG tablet Take 1 tablet (100 mg total) by mouth daily. 07/05/16 07/05/17 Yes Kathleen Spring, MD  ALPRAZolam Duanne Moron) 1 MG tablet Take 0.5-1 tablets (0.5-1 mg total) by mouth 3 (three) times daily as needed for anxiety. Patient not taking: Reported on 08/17/2016 07/05/16   Kathleen Spring, MD  famotidine (PEPCID) 20 MG tablet Take 1 tablet (20 mg total) by mouth 2 (two) times daily. 08/17/16   Kathleen Jefferson, PA-C  promethazine (PHENERGAN) 25 MG suppository Place 1 suppository (25 mg total) rectally every 6 (six) hours as needed for nausea or vomiting. 08/17/16   Kathleen Jefferson, PA-C    Allergies  Allergen Reactions  . Bee Venom Anaphylaxis  . Benadryl [Diphenhydramine Hcl] Nausea Only and Swelling    "makes my throat close up" "makes my throat close up"  . Codeine Itching    Other reaction(s): ITCHING  . Diphenhydramine Nausea Only    "makes my throat close up" "makes my throat close up"  . Morphine And Related Other (See Comments)    Unknown reaction- pt gets sick, dizzy, and confused Intolerance, not an allergy,  received this in the emergency department recently without a problem.    FAMILY HISTORY:  family history includes Bipolar disorder in her daughter; Breast cancer in her mother; Cardiomyopathy in her mother; Heart disease in her maternal aunt; Neurofibromatosis in her brother; Prostate cancer in her father.  SOCIAL HISTORY:  reports that she has never smoked. She has never used smokeless tobacco. She reports that she does not drink alcohol or use drugs.  REVIEW OF SYSTEMS:  POSITIVES IN BOLD Constitutional: Negative for fever, chills, weight loss, malaise/fatigue and diaphoresis.  HENT: Negative for hearing loss, ear pain, nosebleeds, congestion, sore throat, neck pain, tinnitus and ear discharge.   Eyes: Negative for blurred vision, double vision, photophobia, pain, discharge and redness.  Respiratory: Negative for cough, hemoptysis, sputum production, mild shortness of breath, wheezing and stridor.   Cardiovascular: Negative for chest pain, palpitations, orthopnea, claudication, leg swelling and PND.  Gastrointestinal: Negative for heartburn, nausea, vomiting, abdominal pain, diarrhea, constipation, blood in stool and melena.  Genitourinary: Negative for dysuria, urgency, frequency, hematuria and flank pain.  Musculoskeletal: Negative for myalgias, back pain, joint pain and falls.  Skin: Negative for itching and rash.  Neurological: Negative for dizziness, tingling, tremors, sensory change, speech change, focal weakness, seizures, loss of consciousness, weakness and headaches.  Endo/Heme/Allergies: Negative for environmental allergies and polydipsia. Does not bruise/bleed easily.  SUBJECTIVE:   VITAL SIGNS: Temp:  [97.6 F (36.4 C)-98.2 F (36.8 C)] 97.6 F (36.4 C) (12/24 1142) Pulse Rate:  [35-68] 40 (12/24 1000) Resp:  [6-23] 20 (12/24 1000) BP: (98-126)/(43-69) 113/63 (12/24 1142) SpO2:  [83 %-100 %] 100 % (12/24 1142) Weight:  [225 lb 15.5 oz (102.5 kg)] 225 lb 15.5 oz (102.5  kg) (12/24 0353)  PHYSICAL EXAMINATION: General:  Well developed adult female in NAD  Neuro:  AAOx4, speech clear, MAE  HEENT:  MM pink/dry, no jvd, Zeb O2  Cardiovascular:  s1s2 rrr, no m/r/g  Lungs:  Even/non-labored, lungs bilaterally diminished anteriorly, crackles lower  Abdomen:  Obese/soft, bsx4 active  Musculoskeletal:  No acute deformities  Skin:  Warm/dry, no edema    Recent Labs Lab 08/18/16 0519 08/18/16 0900 08/19/16 0545  NA 142 140 139  K 4.2 4.0 3.7  CL 118* 117* 115*  CO2 20* 17* 20*  BUN <5* <5* 5*  CREATININE 0.60 0.71 0.66  GLUCOSE 104* 168* 104*     Recent Labs Lab 08/16/16 1316 08/17/16 1055  HGB 13.2 11.7*  HCT 39.6 35.2*  WBC 6.5 5.0  PLT 320 305    Dg Chest Port 1 View  Result Date: 08/19/2016 CLINICAL DATA:  Acute shortness of breath for 2 days. History of sarcoidosis and pulmonary fibrosis. EXAM: PORTABLE CHEST 1 VIEW COMPARISON:  02/01/2016 FINDINGS: Cardiomegaly evident with increased vascular congestion and interstitial opacities suspicious for mild superimposed edema. Background chronic interstitial lung disease also evident as before. Hazy lung bases noted obscuring the hemidiaphragms suggesting small effusions. No pneumothorax. Trachea is midline. IMPRESSION: Slight increased diffuse interstitial opacities, suspect mild edema superimposed on chronic  lung disease. Electronically Signed   By: Jerilynn Mages.  Shick M.D.   On: 08/19/2016 10:30      SIGNIFICANT EVENTS  12/22  Admit with viral gastroenteritis, adrenal insufficiency related to poor intake / inability to take prednisone 12/23  Improvement with IVF's / IV steroids 12/24  Tx to [facility] for cardiology eval of bradycardia, new O2 requirement > mild edema on CXR  STUDIES:  ECHO 12/24 >> LVEF 50-55%, normal wall motion, mild diffuse hypokinesis, grade 2 diastolic dysfunction, PA peak 96   ASSESSMENT / PLAN:  55 y/o F with PMH of sarcoidosis (biopsy proven at [facility]) on prednisone, IPF (followed  by Dr. Farley Ly at [facility]) admitted with likely viral gastroenteritis / adrenal insufficiency on 12/22 to [facility].  She was volume resuscitated and treated with IV steroids with improvement.  Course complicated by bradycardia and was transferred to [facility] for further evaluation.  Concern for possible cardiac sarcoidosis.    1.  Acute Hypoxic Respiratory Failure - in setting of diastolic dysfunction, pulmonary edema, pulmonary hypertension (PA peak 54) on ECHO   2.  Pulmonary Sarcoidosis / Moderate to Severe Restrictive Lung Disease - on 5 mg prednisone at baseline, has not tolerated higher doses of steroids due to "severe paranoia & mood swings" per her primary pulmonologist.    3.  Pulmonary Hypertension - prior PASP estimated at 32 in 2012, currently 54  4.  Bradycardia - ? Related to sarcoidosis vs other primary arrhythmia / conduction issue   Plan: Continue gentle diuresis as renal function / BP permit  Trend UOP  Monitor BMP  O2 as needed for sats > 90% Consider reduction of stress dose steroids in am 12/25 to baseline dosing  CXR in am to follow up edema  Will need follow up ECHO as an outpatient to reassess PASP.   Will need follow up with her primary pulmonologist, Dr. Farley Ly at discharge  Defer bradycardia evaluation to Cardiology   [name] Gens, NP-C [place] Pgr: [phone] or if no answer [phone] 08/19/2016, 3:16 PM

## 2016-08-19 NOTE — Consult Note (Addendum)
CARDIOLOGY CONSULT NOTE   Patient ID: Kathleen [name] MRN: [id number], DOB/AGE: [birth year]   Admit date: 08/17/2016 Date of Consult: 08/19/2016  Primary Physician: Kathleen Lange, MD Primary Cardiologist: Dr Kathleen [name]   Reason for consult:  Bradycardia  Problem List  Past Medical History:  Diagnosis Date  . Anemia   . Avascular bone necrosis ([place])   . Breast fibrocystic disorder   . Breast pain    right  . DOE (dyspnea on exertion)   . Fibromyalgia   . Gastroesophageal reflux disease   . Heart disease   . Mitral valve prolapse   . Normal echocardiogram December 2012   EF 55 to 60%; mild MVP, grade 1 diastolic dysfunction, PAP 32  . Normal nuclear stress test December 2012  . Normal stress echocardiogram Jan 2011  . Pulmonary fibrosis ([place])   . Sarcoidosis [facility])     Past Surgical History:  Procedure Laterality Date  . BREAST LUMPECTOMY  01/12/2011   right  . TUBAL LIGATION       Allergies  Allergies  Allergen Reactions  . Bee Venom Anaphylaxis  . Benadryl [Diphenhydramine Hcl] Nausea Only and Swelling    "makes my throat close up" "makes my throat close up"  . Codeine Itching    Other reaction(s): ITCHING  . Diphenhydramine Nausea Only    "makes my throat close up" "makes my throat close up"  . Morphine And Related Other (See Comments)    Unknown reaction- pt gets sick, dizzy, and confused Intolerance, not an allergy, received this in the emergency department recently without a problem.    HPI   Kathleen R Lawsonis a 55 y.o.femalewith a history of gastroesophageal reflux disease, fibromyalgia, sarcoidosis with pulmonary fibrosis, bipolar  Disease, stage 4 pulmonary fibrosis on chronic prednisone and pulmonary sarcoidosis who presented to [facility] with acute gastroenteritis - nausea/vomiting and diarrhea.  She was fluid resuscitated and went home, she went back after she felt progressively more SOB, now at erst and was found to be in profound sinus  bradycardia, down to 27 BPM while awake.  She is currently gasping for breath while talking, she has no chest pain. She is saturating 98% on RA.  She was started on hydrocortisone 100 mg Q8H for presumed adrenal insufficiency.  Her daughter states that similar episode happened to her approximately 6 months ago with nausea/vomiting and hypotension, bradycardia after initiation of high dose of steroids.   Inpatient Medications  . budesonide  0.5 mg Nebulization BID  . enoxaparin (LOVENOX) injection  40 mg Subcutaneous Q24H  . ferrous sulfate  325 mg Oral Daily  . hydrocortisone sod succinate (SOLU-CORTEF) inj  100 mg Intravenous Q8H  . magnesium sulfate 1 - 4 g bolus IVPB  2 g Intravenous Once  . pantoprazole  40 mg Oral BID  . phenazopyridine  100 mg Oral TID WC  . potassium chloride  20 mEq Oral Once  . sertraline  100 mg Oral Daily   Family History Family History  Problem Relation Age of Onset  . Cardiomyopathy Mother   . Breast cancer Mother   . Prostate cancer Father   . Neurofibromatosis Brother   . Heart disease Maternal Aunt   . Bipolar disorder Daughter   . Colon cancer Neg Hx     Social History Social History   Social History  . Marital status: Married    Spouse name: N/A  . Number of children: 1  . Years of education: N/A   Occupational  History  . 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 Smoking status: Never Smoker  . Smokeless tobacco: Never Used  . Alcohol use No  . Drug use: No  . Sexual activity: Yes   Other Topics Concern  . Not on file   Social History Narrative   Daily caffeine      Review of Systems  General:  No chills, fever, night sweats or weight changes.  Cardiovascular:  No chest pain, dyspnea on exertion, edema, orthopnea, palpitations, paroxysmal nocturnal dyspnea. Dermatological: No rash, lesions/masses Respiratory: No cough, dyspnea Urologic: No hematuria, dysuria Abdominal:   No nausea, vomiting, diarrhea,  bright red blood per rectum, melena, or hematemesis Neurologic:  No visual changes, wkns, changes in mental status. All other systems reviewed and are otherwise negative except as noted above.  Physical Exam  Blood pressure 113/63, pulse (!) 40, temperature 97.6 F (36.4 C), temperature source Oral, resp. rate 20, height 5\' 10"  (1.778 m), weight 225 lb 15.5 oz (102.5 kg), last menstrual period 05/17/2016, SpO2 100 %.  General: Pleasant, in mild respiratory distress Psych: Normal affect. Neuro: Alert and oriented X 3. Moves all extremities spontaneously. HEENT: Normal  Neck: Supple without bruits or JVD. Lungs:  Resp regular and unlabored, CTA. Heart: RRR no s3, s4, or murmurs. Abdomen: Soft, non-tender, non-distended, BS + x 4.  Extremities: No clubbing, cyanosis or edema. DP/PT/Radials 2+ and equal bilaterally.  Labs   Recent Labs  08/17/16 1055 08/19/16 0545  TROPONINI <0.03 <0.03   Lab Results  Component Value Date   WBC 5.0 08/17/2016   HGB 11.7 (L) 08/17/2016   HCT 35.2 (L) 08/17/2016   MCV 87.3 08/17/2016   PLT 305 08/17/2016    Recent Labs Lab 08/18/16 0900 08/19/16 0545  NA 140 139  K 4.0 3.7  CL 117* 115*  CO2 17* 20*  BUN <5* 5*  CREATININE 0.71 0.66  CALCIUM 7.8* 8.4*  PROT 5.8*  --   BILITOT 0.3  --   ALKPHOS 48  --   ALT 14  --   AST 24  --   GLUCOSE 168* 104*   Lab Results  Component Value Date   CHOL 221 (H) 07/27/2016   HDL 49 07/27/2016   LDLCALC 133 (H) 07/27/2016   TRIG 193 (H) 07/27/2016   Lab Results  Component Value Date   DDIMER 0.50 12/28/2015   Radiology/Studies  Dg Chest Port 1 View  Result Date: 08/19/2016 CLINICAL DATA:  Acute shortness of breath for 2 days. History of sarcoidosis and pulmonary fibrosis. EXAM: PORTABLE CHEST 1 VIEW COMPARISON:  02/01/2016 FINDINGS: Cardiomegaly evident with increased vascular congestion and interstitial opacities suspicious for mild superimposed edema. Background chronic interstitial  lung disease also evident as before. Hazy lung bases noted obscuring the hemidiaphragms suggesting small effusions. No pneumothorax. Trachea is midline. IMPRESSION: Slight increased diffuse interstitial opacities, suspect mild edema superimposed on chronic lung disease. Electronically Signed   By: Jerilynn Mages.  Shick M.D.   On: 08/19/2016 10:30   Echocardiogram - 12/29/2015  - Left ventricle: The cavity size was normal. Wall thickness was   normal. Systolic function was normal. The estimated ejection   fraction was in the range of 60% to 65%. Wall motion was normal;   there were no regional wall motion abnormalities. Left   ventricular diastolic function parameters were normal. - Mitral valve: Mild systolic bowing without prolapse, involving   the anterior leaflet and the posterior leaflet. There was trivial  regurgitation. - Right atrium: Central venous pressure (est): 3 mm Hg. - Atrial septum: No defect or patent foramen ovale was identified. - Tricuspid valve: There was trivial regurgitation. - Pulmonary arteries: PA peak pressure: 35 mm Hg (S). - Pericardium, extracardiac: There was no pericardial effusion.  Impressions:  - Normal LV wall thickness with LVEF 60-65%. Normal diastolic   function. Mild systolic bowing of the mitral valve without frank   prolapse, trivial mitral regurgitation. Trivial tricuspid   rotation with PASP 35 mmHg. No pericardial effusion.  ECG: SR, negative T waves in the inferolateral leads, unchanged from prior ECGs     ASSESSMENT AND PLAN  1. SOB 2. Acute systolic CHF 3. Profound sinus bradycardia 4. Pulmonary fibrosis 5. Pulmnary sarcoidosis  This is a very complicated patient with h/o pulmonary sarcoidosis, stage 4 lung fibrosis on chronic steroid therapy and acute gastroenteritis and acute bradycardia This is possibly an acute viral myocarditis - bedside echo shows LVEF mildly decreased 50-55%, however 60-65% in May 2017 Also RVEF is decreased,  previously described as normal. The differential should also include cardiac sarcoidosis with involvement of consuction system, she will need cardiac MRI once clinically improved.  Her clinical picture is also worsened by underlying lung disease.  Plan:  - I will start dopamine infusion to support her HR - we will draw BNP, stat ABG - we will call PCM to evaluate - place central line, assess Co-ox - start lasix 20 mg iv q6h  Total time spent with the patient 120 minutes including assessment of bedside echocardiogram, talking to the patient and her family.   Signed, Ena Dawley, MD, [facility] 08/19/2016, 1:36 PM

## 2016-08-19 NOTE — Progress Notes (Signed)
0844 Patient and family at bedside requesting that patient be transferred to [facility] asap, MD notified.

## 2016-08-19 NOTE — Progress Notes (Addendum)
PROGRESS NOTE                                                                                                                                                                                                             Patient Demographics:    Kathleen [name], is a 55 y.o. female, DOB - 04-12-1961, [id number]  Admit date - 08/17/2016   Admitting Physician Truett Mainland, DO  Outpatient Primary MD for the patient is Sallee Lange, MD  LOS - 1  Chief Complaint  Patient presents with  . Emesis  . Chest Pain       Brief Narrative   Kathleen [name] is a 55 y.o. female with a history of gastroesophageal reflux disease, fibromyalgia, sarcoidosis with pulmonary fibrosis On chronic prednisone. She was admitted for gastroenteritis induced mild adrenal crisis, stabilized after IV fluids and IV steroids. Her current issue is sinus bradycardia as noted in #4 below. She is adamant that she wants to see a cardiologist and wants to be transferred to [facility].   Subjective:    [name] today has, No headache, No chest pain, No abdominal pain - No Nausea, No new weakness tingling or numbness, No Cough - SOB. Feels weak overall.   Assessment  & Plan :     1.Nausea vomiting, hypotension due to adrenal insufficiency in a patient who is chronically on prednisone but was unable to take it due to gastroenteritis. Gastroenteritis symptoms have improved, She has ruled out for C. difficile, she is on Imodium when necessary, she has been adequately hydrated, continue on IV stress dose steroids still blood pressure stabilize completely. We'll stop IV fluids as she is developing mild rales. Monitor blood pressure closely. Upon ambulating today she says that she feels slightly weak and does not feel like she can go home at all.  2. Gastroenteritis. No emesis, stool mildly soft, continue bowel rest and monitor. No further emesis.  3.  Sarcoidosis with pulmonary fibrosis. On chronic steroids, currently on stress dose and monitor. Thereafter outpatient pulmonary and rheumatology follow-up. Continue supportive care.  4. Sinus bradycardia when she is in bed or sleeping does have Sarcoid but Excellent chronotropic response upon sitting up and walking without any symptoms, EKG is stable, stable TSH. Her case was discussed with cardiologist Dr. Gwenlyn Found on 08/18/2016 morning, on 08/19/2016 case  was discussed with cardiologist Dr. Domenic Polite and EP MD Dr. Rayann Heman, both suggested no further workup.  The nursing staff was educated by me personally on 08/18/2016 about bradycardia as above, however late at night on 08/18/2016 nighttime nursing staff according to the patient told her that her heart rate was of concern that she had heart block. Now patient and family are convinced that patient needs to be seen by cardiology ASAP, I had detailed discussion with the patient however she is adamant that she wants to be transferred to [facility] I am transferring her to [facility] upon family and patient's insistence. She will be seen by cardiology and hopefully this will ease her concern. Upon telemetry review I did not appreciate any heart block, again this morning she had good chronotropic response upon walking heart rate was 60s.   5. GERD. On PPI  6. Mild shortness of breath. Few rales on exam, likely due to overzealous IV fluid administration + chronic Pulm fibrosis and atelectasis while she was hypotensive, fluids stopped, gentle Lasix 1, incentive spirometer, check CXR and monitor.    Family Communication  :  None  Code Status :  Full  Diet : Diet clear liquid Room service appropriate? Yes; Fluid consistency: Thin   Disposition Plan  :  [facility]  Consults  : [facility] 3 over the phone  Procedures  :  None  DVT Prophylaxis  :  Lovenox    Lab Results  Component Value Date   PLT 305 08/17/2016    Inpatient  Medications  Scheduled Meds: . budesonide  0.5 mg Nebulization BID  . enoxaparin (LOVENOX) injection  40 mg Subcutaneous Q24H  . ferrous sulfate  325 mg Oral Daily  . hydrocortisone sod succinate (SOLU-CORTEF) inj  100 mg Intravenous Q8H  . magnesium sulfate 1 - 4 g bolus IVPB  2 g Intravenous Once  . pantoprazole  40 mg Oral BID  . phenazopyridine  100 mg Oral TID WC  . potassium chloride  20 mEq Oral Once  . sertraline  100 mg Oral Daily   Continuous Infusions:  PRN Meds:.acetaminophen **OR** [DISCONTINUED] acetaminophen, albuterol, loperamide, [DISCONTINUED] ondansetron **OR** ondansetron (ZOFRAN) IV, sodium chloride  Antibiotics  :    Anti-infectives    None         Objective:   Vitals:   08/19/16 0500 08/19/16 0700 08/19/16 0804 08/19/16 0810  BP: (!) 105/50 (!) 108/44 98/65   Pulse: (!) 39 (!) 35 68 (!) 37  Resp: (!) 6 (!) 23 18 17   Temp:      TempSrc:      SpO2: 96% 98% (!) 83% 96%  Weight:      Height:        Wt Readings from Last 3 Encounters:  08/19/16 102.5 kg (225 lb 15.5 oz)  08/16/16 90.7 kg (200 lb)  01/02/16 100.2 kg (220 lb 14.4 oz)     Intake/Output Summary (Last 24 hours) at 08/19/16 0855 Last data filed at 08/19/16 [id number]  Gross per 24 hour  Intake          1760.83 ml  Output             1901 ml  Net          -140.17 ml     Physical Exam  Awake Alert, Oriented X 3, No new F.N deficits, Normal affect [place].AT,PERRAL Supple Neck,No JVD, No cervical lymphadenopathy appriciated.  Symmetrical Chest wall movement, Good air movement bilaterally, CTAB RRR,No  Gallops,Rubs or new Murmurs, No Parasternal Heave +ve B.Sounds, Abd Soft, No tenderness, No organomegaly appriciated, No rebound - guarding or rigidity. No Cyanosis, Clubbing or edema, No new Rash or bruise       Data Review:    CBC  Recent Labs Lab 08/16/16 1316 08/17/16 1055  WBC 6.5 5.0  HGB 13.2 11.7*  HCT 39.6 35.2*  PLT 320 305  MCV 87.2 87.3  MCH 29.1 29.0  MCHC 33.3  33.2  RDW 14.3 14.1  LYMPHSABS  --  0.6*  MONOABS  --  0.5  EOSABS  --  0.1  BASOSABS  --  0.0    Chemistries   Recent Labs Lab 08/16/16 1316 08/17/16 1055 08/18/16 0519 08/18/16 0900 08/19/16 0545  NA 140 141 142 140 139  K 4.5 3.4* 4.2 4.0 3.7  CL 108 112* 118* 117* 115*  CO2 26 25 20* 17* 20*  GLUCOSE 100* 87 104* 168* 104*  BUN 8 6 <5* <5* 5*  CREATININE 0.77 0.84 0.60 0.71 0.66  CALCIUM 9.8 8.8* 7.6* 7.8* 8.4*  MG  --   --   --   --  1.6*  AST 17 13*  --  24  --   ALT 15 14  --  14  --   ALKPHOS 70 54  --  48  --   BILITOT 0.6 0.4  --  0.3  --    ------------------------------------------------------------------------------------------------------------------ No results for input(s): CHOL, HDL, LDLCALC, TRIG, CHOLHDL, LDLDIRECT in the last 72 hours.  No results found for: HGBA1C ------------------------------------------------------------------------------------------------------------------  Recent Labs  08/18/16 0856  TSH 0.463   ------------------------------------------------------------------------------------------------------------------ No results for input(s): VITAMINB12, FOLATE, FERRITIN, TIBC, IRON, RETICCTPCT in the last 72 hours.  Coagulation profile No results for input(s): INR, PROTIME in the last 168 hours.  No results for input(s): DDIMER in the last 72 hours.  Cardiac Enzymes  Recent Labs Lab 08/17/16 1055 08/19/16 0545  TROPONINI <0.03 <0.03   ------------------------------------------------------------------------------------------------------------------ No results found for: BNP  Micro Results Recent Results (from the past 240 hour(s))  MRSA PCR Screening     Status: None   Collection Time: 08/17/16  7:18 PM  Result Value Ref Range Status   MRSA by PCR NEGATIVE NEGATIVE Final    Comment:        The GeneXpert MRSA Assay (FDA approved for NASAL specimens only), is one component of a comprehensive MRSA  colonization surveillance program. It is not intended to diagnose MRSA infection nor to guide or monitor treatment for MRSA infections.   C difficile quick scan w PCR reflex     Status: None   Collection Time: 08/18/16  5:30 PM  Result Value Ref Range Status   C Diff antigen NEGATIVE NEGATIVE Final   C Diff toxin NEGATIVE NEGATIVE Final   C Diff interpretation No C. difficile detected.  Final    Radiology Reports No results found.  Time Spent in minutes  30   [name] Lund K M.D on 08/19/2016 at 8:55 AM  Between 7am to 7pm - Pager - [phone]  After 7pm go to [email or web address] - password [facility]  [facility] -  Office  [phone]

## 2016-08-19 NOTE — Progress Notes (Signed)
46 Patient's mother and sister upset and requesting that patient please be sent to [facility] d/t her being sick since Tuesday per patient and patient's mother. MD notified.

## 2016-08-19 NOTE — Progress Notes (Signed)
Received notification from telemetry that patient had a 2.52sec pause on monitor and MD was paged and made aware. MD wants patient transferred back to [facility] will follow up with charge nurse.

## 2016-08-19 NOTE — Progress Notes (Signed)
Patient ID: Kathleen [name], female   DOB: 1960-12-21, 55 y.o.   MRN: [id number] The patient was seen earlier because of pleuritic chest pain after nebulizer treatment. The pain is in the mid-lower left chest wall and usually is triggered by nebulizer treatment induced cough. Lungs were clear to ausculation. Heart bradycardic at 42 bpm. Troponin was ordered earlier and EKG does not show any new changes.   Tennis Must, MD [phone].

## 2016-08-19 NOTE — Progress Notes (Signed)
Patient ID: Kathleen [name], female   DOB: 01-13-1961, 54 y.o.   MRN: [id number] The patient was transferred to the stepdown unit due to bradycardia in the 20s and 2 possibly some more than 2.5 seconds while she was sleeping. Telemetry strips could not be fully evaluated, but it seems that this may have been vasovagal. I discussed it with Dr. Kenton Kingfisher from cardiology who agreed with close monitoring, but did not feel that the patient needed to be transferred right away to [facility]. However, she could benefit from cardiology evaluation later in the day.  Tennis Must, M.D. [phone].

## 2016-08-19 NOTE — Progress Notes (Signed)
1005 called [place] 3W and spoke with Cyril Mourning, RN and report was given on patient transferring to [facility] 3W room# 22. Awaiting [facility] arrival at this time.

## 2016-08-19 NOTE — Progress Notes (Signed)
1100 patient left with [facility] in route to [facility] 3W. Patient A&O with O2 2L [place] in place.

## 2016-08-20 ENCOUNTER — Inpatient Hospital Stay (HOSPITAL_COMMUNITY): Payer: 59

## 2016-08-20 DIAGNOSIS — R778 Other specified abnormalities of plasma proteins: Secondary | ICD-10-CM

## 2016-08-20 DIAGNOSIS — R7989 Other specified abnormal findings of blood chemistry: Secondary | ICD-10-CM

## 2016-08-20 DIAGNOSIS — R001 Bradycardia, unspecified: Secondary | ICD-10-CM

## 2016-08-20 DIAGNOSIS — R748 Abnormal levels of other serum enzymes: Secondary | ICD-10-CM

## 2016-08-20 LAB — RESPIRATORY PANEL BY PCR

## 2016-08-20 LAB — BASIC METABOLIC PANEL
Anion gap: 11 (ref 5–15)
BUN: 5 mg/dL — ABNORMAL LOW (ref 6–20)
CO2: 31 mmol/L (ref 22–32)
Calcium: 9.5 mg/dL (ref 8.9–10.3)
Chloride: 100 mmol/L — ABNORMAL LOW (ref 101–111)
Creatinine, Ser: 0.79 mg/dL (ref 0.44–1.00)
GFR calc Af Amer: >60 mL/min (ref >60)
GFR calc non Af Amer: >60 mL/min (ref >60)
Glucose, Bld: 144 mg/dL — ABNORMAL HIGH (ref 65–99)
Potassium: 3.2 mmol/L — ABNORMAL LOW (ref 3.5–5.1)
Sodium: 142 mmol/L (ref 135–145)

## 2016-08-20 LAB — TROPONIN I
Troponin I: 0.1 ng/mL (ref <0.03)
Troponin I: 0.47 ng/mL (ref <0.03)

## 2016-08-20 LAB — CBC
HCT: 41.7 % (ref 36.0–46.0)
Hemoglobin: 14.8 g/dL (ref 12.0–15.0)
MCH: 28.9 pg (ref 26.0–34.0)
MCHC: 35.5 g/dL (ref 30.0–36.0)
MCV: 81.4 fL (ref 78.0–100.0)
Platelets: 337 10*3/uL (ref 150–400)
RBC: 5.12 MIL/uL — ABNORMAL HIGH (ref 3.87–5.11)
RDW: 13.9 % (ref 11.5–15.5)
WBC: 13.8 10*3/uL — ABNORMAL HIGH (ref 4.0–10.5)

## 2016-08-20 LAB — MAGNESIUM: Magnesium: 1.8 mg/dL (ref 1.7–2.4)

## 2016-08-20 MED ORDER — PREDNISONE 20 MG PO TABS
40.0000 mg | ORAL_TABLET | Freq: Two times a day (BID) | ORAL | Status: DC
  Filled 2016-08-20: qty 2

## 2016-08-20 MED ORDER — ASPIRIN 81 MG PO CHEW
81.0000 mg | CHEWABLE_TABLET | Freq: Every day | ORAL | Status: DC
  Administered 2016-08-21: 81 mg via ORAL
  Filled 2016-08-20: qty 1

## 2016-08-20 MED ORDER — PREDNISONE 20 MG PO TABS: 20.0000 mg | ORAL_TABLET | Freq: Every day | ORAL | Status: DC

## 2016-08-20 MED ORDER — SODIUM CHLORIDE 0.9 % IV SOLN
30.0000 meq | INTRAVENOUS | Status: AC
  Administered 2016-08-20 (×2): 30 meq via INTRAVENOUS
  Filled 2016-08-20 (×2): qty 15

## 2016-08-20 MED ORDER — METHYLPREDNISOLONE SODIUM SUCC 40 MG IJ SOLR: 40.0000 mg | Freq: Two times a day (BID) | INTRAMUSCULAR | Status: DC

## 2016-08-20 MED ORDER — NYSTATIN 100000 UNIT/ML MT SUSP
5.0000 mL | Freq: Four times a day (QID) | OROMUCOSAL | Status: DC
  Administered 2016-08-20 – 2016-08-21 (×2): 500000 [IU] via ORAL
  Filled 2016-08-20 (×2): qty 5

## 2016-08-20 MED ORDER — PREDNISONE 50 MG PO TABS: 50.0000 mg | ORAL_TABLET | Freq: Every day | ORAL | Status: DC

## 2016-08-20 MED ORDER — SERTRALINE HCL 50 MG PO TABS
50.0000 mg | ORAL_TABLET | Freq: Every day | ORAL | Status: DC
  Filled 2016-08-20: qty 1

## 2016-08-20 MED ORDER — NOREPINEPHRINE BITARTRATE 1 MG/ML IV SOLN
2.0000 ug/min | INTRAVENOUS | Status: DC
  Administered 2016-08-20: 10 ug/min via INTRAVENOUS
  Administered 2016-08-20: 45 ug/min via INTRAVENOUS
  Administered 2016-08-21: 35 ug/min via INTRAVENOUS
  Filled 2016-08-20 (×5): qty 4

## 2016-08-20 MED ORDER — VASOPRESSIN 20 UNIT/ML IV SOLN
0.0400 [IU]/min | INTRAVENOUS | Status: DC
  Administered 2016-08-20: 0.03 [IU]/min via INTRAVENOUS
  Filled 2016-08-20 (×2): qty 2

## 2016-08-20 MED ORDER — AMIODARONE HCL IN DEXTROSE 360-4.14 MG/200ML-% IV SOLN
60.0000 mg/h | INTRAVENOUS | Status: AC
  Administered 2016-08-20 (×2): 60 mg/h via INTRAVENOUS
  Filled 2016-08-20 (×2): qty 200

## 2016-08-20 MED ORDER — AMIODARONE HCL IN DEXTROSE 360-4.14 MG/200ML-% IV SOLN
30.0000 mg/h | INTRAVENOUS | Status: DC
  Administered 2016-08-21: 30 mg/h via INTRAVENOUS

## 2016-08-20 MED ORDER — ALUM & MAG HYDROXIDE-SIMETH 200-200-20 MG/5ML PO SUSP: 15.0000 mL | ORAL | Status: DC | PRN

## 2016-08-20 MED ORDER — HEPARIN BOLUS VIA INFUSION
4000.0000 [IU] | Freq: Once | INTRAVENOUS | Status: AC
  Administered 2016-08-20: 4000 [IU] via INTRAVENOUS
  Filled 2016-08-20: qty 4000

## 2016-08-20 MED ORDER — SODIUM CHLORIDE 0.9 % IV BOLUS (SEPSIS)
500.0000 mL | Freq: Once | INTRAVENOUS | Status: AC
  Administered 2016-08-20: 500 mL via INTRAVENOUS

## 2016-08-20 MED ORDER — FAMOTIDINE IN NACL 20-0.9 MG/50ML-% IV SOLN
20.0000 mg | Freq: Two times a day (BID) | INTRAVENOUS | Status: DC
  Administered 2016-08-20 – 2016-08-21 (×4): 20 mg via INTRAVENOUS
  Filled 2016-08-20 (×4): qty 50

## 2016-08-20 MED ORDER — METHYLPREDNISOLONE SODIUM SUCC 40 MG IJ SOLR
20.0000 mg | Freq: Every day | INTRAMUSCULAR | Status: DC
  Administered 2016-08-20: 20 mg via INTRAVENOUS
  Filled 2016-08-20: qty 1

## 2016-08-20 MED ORDER — AMIODARONE HCL IN DEXTROSE 360-4.14 MG/200ML-% IV SOLN
INTRAVENOUS | Status: AC
  Filled 2016-08-20: qty 200

## 2016-08-20 MED ORDER — PREDNISONE 10 MG PO TABS: 5.0000 mg | ORAL_TABLET | Freq: Every day | ORAL | Status: DC

## 2016-08-20 MED ORDER — POTASSIUM CHLORIDE CRYS ER 20 MEQ PO TBCR: 40.0000 meq | EXTENDED_RELEASE_TABLET | ORAL | Status: DC

## 2016-08-20 MED ORDER — HEPARIN (PORCINE) IN NACL 100-0.45 UNIT/ML-% IJ SOLN
1300.0000 [IU]/h | INTRAMUSCULAR | Status: DC
  Administered 2016-08-20: 1150 [IU]/h via INTRAVENOUS
  Administered 2016-08-21: 1300 [IU]/h via INTRAVENOUS
  Filled 2016-08-20 (×2): qty 250

## 2016-08-20 MED ORDER — PHENYLEPHRINE HCL 10 MG/ML IJ SOLN
30.0000 ug/min | INTRAVENOUS | Status: DC
  Filled 2016-08-20: qty 1

## 2016-08-20 MED ORDER — AMIODARONE LOAD VIA INFUSION
300.0000 mg | Freq: Once | INTRAVENOUS | Status: AC
  Administered 2016-08-20: 300 mg via INTRAVENOUS

## 2016-08-20 MED ORDER — ASPIRIN 81 MG PO CHEW
324.0000 mg | CHEWABLE_TABLET | ORAL | Status: AC
  Administered 2016-08-20: 324 mg via ORAL
  Filled 2016-08-20: qty 4

## 2016-08-20 NOTE — Progress Notes (Signed)
S:  Called to bedside for hypotension.  Dopamine had previously been titrated off.  Pt became nauseated and vomited.  She reports intermittent nausea / vomiting throughout the day.    O:  Blood pressure 134/68, pulse 86, temperature 98.1 F (36.7 C), temperature source Oral, resp. rate 18, height 5\' 10"  (1.778 m), weight 225 lb 15.5 oz (102.5 kg), last menstrual period 05/17/2016, SpO2 93 %.    Recent Labs Lab 08/16/16 1316 08/17/16 1055 08/20/16 0314  HGB 13.2 11.7* 14.8  HCT 39.6 35.2* 41.7  WBC 6.5 5.0 13.8*  PLT 320 305 337    Recent Labs Lab 08/17/16 1055 08/18/16 0519 08/18/16 0900 08/19/16 0545 08/20/16 0314  NA 141 142 140 139 142  K 3.4* 4.2 4.0 3.7 3.2*  CL 112* 118* 117* 115* 100*  CO2 25 20* 17* 20* 31  GLUCOSE 87 104* 168* 104* 144*  BUN 6 <5* <5* 5* 5*  CREATININE 0.84 0.60 0.71 0.66 0.79  CALCIUM 8.8* 7.6* 7.8* 8.4* 9.5  MG  --   --   --  1.6* 1.8   A:  Nausea / Vomiting  Hypotension  Bradycardia  Steroid Dependent - 5mg  baseline, does not tolerate high dose steroids  Relative Adrenal Insufficiency - improving on steriods  P:  Continue dopamine, titrate for HR >50 and SBP >100 PRN zofran for nausea  Assess EKG, troponin  NS 500 ml bolus x1 Transition to solumedrol IV to ensure patient receives her maintenance steroids, will bump to 20 mg QD (as per TRH) D/C neosynephrine (never started)  [name] Gens, NP-C [facility] Pgr: [phone] or if no answer [phone] 08/20/2016, 3:17 PM

## 2016-08-20 NOTE — Progress Notes (Signed)
eLink Physician-Brief Progress Note Patient Name: Kathleen [name] DOB: Sep 13, 1960 MRN: [id number]   Date of Service  08/20/2016  HPI/Events of Note  BP 40/20 on dopamine infusion Patient nautious, CAN NOT camera check at this time  eICU Interventions  Will start NEO        Bryker Fletchall 08/20/2016, 3:04 PM

## 2016-08-20 NOTE — Progress Notes (Addendum)
   CARDIOLOGY ON-CALL NOTE  Patient with persistent AF with RVR and hypotension.   CCM at bedside but family angry and demanding to see cardiology.   On my arrival patient had already received 300mg  amio bolus. Now predominantly NSR but in out AF with RVR. SBP previously in 70s now in low to mid 90s on norepi 40. Dopamine stopped. Given IVF.  She denies CP or SOB or dizziness. Feels weak.    Will start vasopressin and wean norepi as tolerated. Patient was extremely bradycardic yesterday raising concern for sarcoid infiltration into conduction system. cMRI pending. Will need to be careful with amio gtt.   CCM to place central line (much appreciated) for administration of pressors. Can follow CVP and co-ox.   Patient's family with multiple questions at bedside which were answered regarding sarcoid, heart cath, cMRI. I made it clear that we weren't completely sure of the etiology for her decompensation but that the appropriate consultants were following and w/u to date has been excellent.   The patient is critically ill with multiple organ systems failure and requires high complexity decision making for assessment and support, frequent evaluation and titration of therapies, application of advanced monitoring technologies and extensive interpretation of multiple databases.   Critical Care Time devoted to patient care services described in this note is 35 Minutes.  Kaitelyn Jamison,MD 10:32 PM

## 2016-08-20 NOTE — Progress Notes (Signed)
ANTICOAGULATION CONSULT NOTE - Initial Consult  Pharmacy Consult for Heparin Indication: chest pain/ACS  Assessment: 65 yoF admitted 12/22 with c/o N/V found to have acute on chronic adrenal insufficiency likely due to viral gastroenteritis. Pharmacy has been consulted to dose and monitor heparin for suspected ACS event in setting of elevated troponin level and ST segment changes. Patient was on enoxaparin since admission for VTE prophylaxis, last dose 12/24 at 2100. No PTA anticoagulation. Given timing of last dose of enoxaparin, feel comfortable dosing bolus given ACS indication. Hemoglobin and platelets wnl, no bleeding noted. Heparin dosing weight 90.7 kg.   Goal of Therapy:  Heparin level 0.3-0.7 units/ml Monitor platelets by anticoagulation protocol: Yes   Plan:  Give 4000 units bolus x 1 Start heparin infusion at 1150 units/hr Check anti-Xa level in 6 hours and daily while on heparin Continue to monitor H&H and platelets   Allergies  Allergen Reactions  . Bee Venom Anaphylaxis  . Benadryl [Diphenhydramine Hcl] Nausea Only and Swelling    "makes my throat close up" "makes my throat close up"  . Codeine Itching    Other reaction(s): ITCHING  . Diphenhydramine Nausea Only    "makes my throat close up" "makes my throat close up"  . Morphine And Related Other (See Comments)    Unknown reaction- pt gets sick, dizzy, and confused Intolerance, not an allergy, received this in the emergency department recently without a problem.    Patient Measurements: Height: 5\' 10"  (177.8 cm) Weight: 225 lb 15.5 oz (102.5 kg) IBW/kg (Calculated) : 68.5 Heparin Dosing Weight: 90.7 kg  Vital Signs: Temp: 98.1 F (36.7 C) (12/25 1148) Temp Source: Oral (12/25 1148) BP: 142/70 (12/25 1600) Pulse Rate: 76 (12/25 1600)  Labs:  Recent Labs  08/18/16 0900 08/19/16 0545 08/20/16 0314 08/20/16 1518  HGB  --   --  14.8  --   HCT  --   --  41.7  --   PLT  --   --  337  --   CREATININE  0.71 0.66 0.79  --   TROPONINI  --  <0.03  --  0.10*    Estimated Creatinine Clearance: 105.4 mL/min (by C-G formula based on SCr of 0.79 mg/dL).   Medical History: Past Medical History:  Diagnosis Date  . Anemia   . Avascular bone necrosis ([place])   . Breast fibrocystic disorder   . Breast pain    right  . DOE (dyspnea on exertion)   . Fibromyalgia   . Gastroesophageal reflux disease   . Heart disease   . Mitral valve prolapse   . Normal echocardiogram December 2012   EF 55 to 60%; mild MVP, grade 1 diastolic dysfunction, PAP 32  . Normal nuclear stress test December 2012  . Normal stress echocardiogram Jan 2011  . Pulmonary fibrosis ([place])   . Sarcoidosis ([place])     Medications:  Scheduled:  . aspirin  324 mg Oral NOW  . [START ON 08/21/2016] aspirin  81 mg Oral Daily  . budesonide  0.5 mg Nebulization BID  . famotidine (PEPCID) IV  20 mg Intravenous Q12H  . ferrous sulfate  325 mg Oral Daily  . heparin  4,000 Units Intravenous Once  . methylPREDNISolone (SOLU-MEDROL) injection  20 mg Intravenous Daily  . nystatin  5 mL Oral QID  . phenazopyridine  100 mg Oral TID WC  . [START ON 08/21/2016] sertraline  50 mg Oral Daily   Infusions:  . heparin    . norepinephrine (LEVOPHED)  Adult infusion      Belia Heman, PharmD PGY1 Pharmacy Resident [phone] (Pager) 08/20/2016 5:54 PM

## 2016-08-20 NOTE — Progress Notes (Addendum)
  Patient developed rapid AF with HR ~190. Dopamine stopped. Remains on norepi. BP 90/45.   Will give 250 NS. Start IV amiodarone.   CCM at bedside to place central line. If AF with RVR persists can consider emergent DC-CV as needed. She is already on dopamine for presumed ACS.  Will follow closely.   Laurelyn Terrero,MD 9:27 PM

## 2016-08-20 NOTE — Progress Notes (Addendum)
Patient seen after being called to the room for elevated troponin and hypotension, with inability to wean dopamine. She was seen earlier by Dr. Acie Fredrickson and felt to have a possible acute viral myocarditis. Echo shows a newly reduced LVEF of 50-55% and there is moderate RV dysfunction. While I agree that she may have involvement of the myocardium with sarcoid and a cMRI would be the best way to evaluate this, she now has an elevated troponin and lateral ST segment depression with TWI in III and flattening in II and AVF. This could suggest that she has significant CAD, possible LCx or RCA involvement causing her bradycardia and hypotension. As she is on dopamine, this may potentially worsen her ischemia. I would recommend switch from dopamine to levophed, start IV Heparin per pharmacy for ACS and trend troponins overnight. Load with ASA 324 mg x 1 now, the 81 mg daily thereafter. She would likely benefit from [facility] in the next few days. Monitor HR closely on levophed for recurrent bradycardia since there is less B1 activity. Discussed the medication changes with the family at the bedside and they are agreeable.  TIME SPENT WITH THE PATIENT: 15 minutes  Pixie Casino, MD, [facility] Attending Cardiologist [place]

## 2016-08-20 NOTE — Progress Notes (Addendum)
Noted patients BP dropped to 58/35, HR NSR 70's, arrived to bedside immediately. Pt states, "she feels fine" but on assessment appears diaphoretic and lethargic, though she remains A&Ox4. Rechecked manual cuff pressure and noted it to be 40/20. HR SB in 40's. Zoll pads applied. MD Elsworth Soho and NP Velna Hatchet paged. 500cc bolus ordered and given. Dopamine titrated to 72mcgs. BP currently 81/59, and HR NSR 70's.  Will continue to monitor closely.

## 2016-08-20 NOTE — Progress Notes (Signed)
Name: Kathleen [name] MRN: [id number] DOB: August 22, 1961    ADMISSION DATE:  08/17/2016 CONSULTATION DATE:  08/19/16  REFERRING MD :  Dr. Candiss Norse   CHIEF COMPLAINT:  Weakness / Bradycardia    HISTORY OF PRESENT ILLNESS:  55 y/o F with PMH of anemia, fibromyalgia, GERD, mitral valve prolapse, sarcoidosis and pulmonary fibrosis on chronic prednisone who was admitted to [facility] on 12/22 for gastroenteritis (nausea / vomiting and poor PO intake) which led to mild adrenal crisis as she could not take PO prednisone.  She was stabilized with IV fluids and steroids.  C-Diff was ruled out.  She was found to have bradycardia and requested transfer to [facility] for evaluation by Cardiology.  She was also noted to have a new O2 requirement of 2L.   CXR showed mild edema superimposed on IPF.  She was treated with IV lasix with improvement in respiratory status.  The patient was also treated with dopamine for bradycardia.    PCCM called for evaluation of hypoxia.    SUBJECTIVE: Afebrile C./o nausea Dopamine increased to 10 mics    VITAL SIGNS: Temp:  [97.7 F (36.5 C)-98.2 F (36.8 C)] 98.1 F (36.7 C) (12/25 1148) Pulse Rate:  [49-127] 77 (12/25 1300) Resp:  [14-29] 18 (12/25 1300) BP: (75-160)/(47-89) 127/77 (12/25 1300) SpO2:  [92 %-99 %] 92 % (12/25 1300)  PHYSICAL EXAMINATION: General:  Well developed adult female in NAD  Neuro:  AAOx4, speech clear, MAE  HEENT:  MM pink/dry, no jvd, [place] O2  Cardiovascular:  s1s2 rrr, no m/r/g  Lungs:  Even/non-labored, lungs bilaterally diminished anteriorly, crackles lower  Abdomen:  Obese/soft, bsx4 active  Musculoskeletal:  No acute deformities  Skin:  Warm/dry, no edema    Recent Labs Lab 08/18/16 0900 08/19/16 0545 08/20/16 0314  NA 140 139 142  K 4.0 3.7 3.2*  CL 117* 115* 100*  CO2 17* 20* 31  BUN <5* 5* 5*  CREATININE 0.71 0.66 0.79  GLUCOSE 168* 104* 144*     Recent Labs Lab 08/16/16 1316 08/17/16 1055  08/20/16 0314  HGB 13.2 11.7* 14.8  HCT 39.6 35.2* 41.7  WBC 6.5 5.0 13.8*  PLT 320 305 337    Dg Chest Port 1 View  Result Date: 08/20/2016 CLINICAL DATA:  Acute respiratory failure with hypoxia. EXAM: PORTABLE CHEST 1 VIEW COMPARISON:  1 day prior FINDINGS: Numerous leads and wires project over the chest. Patient rotated to the left. Cardiomegaly accentuated by AP portable technique. Small left pleural effusion is new or increased. No pneumothorax. Moderate interstitial edema, increased and accentuated by diminished lung volumes. Left base airspace disease. IMPRESSION: Worsening aeration with increased interstitial edema and decreased ventilation. New or increased left pleural effusion with adjacent airspace disease. Electronically Signed   By: Abigail Miyamoto M.D.   On: 08/20/2016 08:27   Dg Chest Port 1 View  Result Date: 08/19/2016 CLINICAL DATA:  Acute shortness of breath for 2 days. History of sarcoidosis and pulmonary fibrosis. EXAM: PORTABLE CHEST 1 VIEW COMPARISON:  02/01/2016 FINDINGS: Cardiomegaly evident with increased vascular congestion and interstitial opacities suspicious for mild superimposed edema. Background chronic interstitial lung disease also evident as before. Hazy lung bases noted obscuring the hemidiaphragms suggesting small effusions. No pneumothorax. Trachea is midline. IMPRESSION: Slight increased diffuse interstitial opacities, suspect mild edema superimposed on chronic lung disease. Electronically Signed   By: Jerilynn Mages.  Shick M.D.   On: 08/19/2016 10:30   Dg Abd Portable 1v  Result Date: 08/20/2016 CLINICAL  DATA:  Nausea and vomiting. EXAM: PORTABLE ABDOMEN - 1 VIEW COMPARISON:  02/01/2016 and chest x-ray earlier today. MRI pelvis 01/04/2015. FINDINGS: Bowel gas pattern is nonobstructive. No abdominal mass or mass effect. No abnormal calcifications. Couple left-sided pelvic phleboliths. Known avascular necrosis of the left femoral head with possible subchondral fracture.  Small amount left pleural fluid. IMPRESSION: Nonobstructive bowel gas pattern. Known avascular necrosis of the left femoral head with possible subchondral fracture. Small left pleural effusion. Electronically Signed   By: Marin Olp M.D.   On: 08/20/2016 10:41      SIGNIFICANT EVENTS  12/22  Admit with viral gastroenteritis, adrenal insufficiency related to poor intake / inability to take prednisone 12/23  Improvement with IVF's / IV steroids 12/24  Tx to [facility] for cardiology eval of bradycardia, new O2 requirement > mild edema on CXR  STUDIES:  ECHO 12/24 >> LVEF 50-55%, normal wall motion, mild diffuse hypokinesis, grade 2 diastolic dysfunction, PA peak 13   ASSESSMENT / PLAN:  55 y/o F with PMH of sarcoidosis (biopsy proven at [facility]) on prednisone, IPF (followed by Dr. Farley Ly at [facility]) admitted with likely viral gastroenteritis / adrenal insufficiency on 12/22 to [facility].  She was volume resuscitated and treated with IV steroids with improvement.  Course complicated by bradycardia and was transferred to [facility] for further evaluation.  Concern for possible cardiac sarcoidosis.    1.  Acute Hypoxic Respiratory Failure - in setting of diastolic dysfunction, pulmonary edema, pulmonary hypertension (PA peak 54) on ECHO  I do not think she will need oxygen long-term, need for oxygen can be reassessed on discharge  2.  Pulmonary Sarcoidosis / Moderate to Severe Restrictive Lung Disease - on 5 mg prednisone at baseline, has not tolerated higher doses of steroids due to "severe paranoia & mood swings" per her primary pulmonologist.  She does not need stress dose steroids currently- IV equivalent of 5 mg of prednisone will be 6-10 mg of Solu-Medrol  lung function in the 05/2016 was estimated to be at 55% with DLCO of 38%-impression has been burned out sarcoidosis   3.  Pulmonary Hypertension - prior PASP estimated at 32 in 2012, currently 71 -I think this is explained on the basis of fluid  overload, she will likely need a follow-up echo  4.  Bradycardia - ? Related to sarcoidosis vs other primary arrhythmia / conduction issue, titrate dopamine to off  [facility] available as needed  Rigoberto Noel. MD   08/20/2016, 1:55 PM

## 2016-08-20 NOTE — Progress Notes (Signed)
CRITICAL VALUE ALERT  Critical value received: Troponin 0.10  Date of notification:  08/20/16  Time of notification:  [id number]  Critical value read back:Yes.    Nurse who received alert:  Gevena Cotton  MD notified (1st page):  PA Hilty   Time of first page:  [phone]

## 2016-08-20 NOTE — Progress Notes (Signed)
[facility] Progress Note  Patient: Kathleen [name] [id number]   PCP: Sallee Lange, MD DOB: Sep 16, 1960   DOA: 08/17/2016   DOS: 08/20/2016   Date of Service: the patient was seen and examined on 08/20/2016  Brief hospital course: Pt. with PMH of Sarcoidosis, adrenal insufficiency, chronic steroid dependence, GERD; admitted on 08/17/2016, with complaint of nausea and vomiting, was found to have acute on chronic adrenal insufficiency in the setting of viral gastroenteritis as well as sinus bradycardia. Currently further plan is continue current care.  Assessment and Plan: 1. Acute on chronic adrenal insufficiency Hypotensive on arrival, received IV fluids in the ER. Started on IV Solu-Cortef 100 mg every 8 hours. At present suspected to have recurrent nausea associated with Solu-Cortef and therefore initially was recommended to change to oral prednisone. I would attempt IV Solu Medrol instead of IV Solu-Cortef since the patient remains nauseated at present. Equivalent dose of 300 mg daily started Cortef is 75 mg daily some Medrol. We will use 40 mg every 12 hours at present.  2. Sinus bradycardia Viral myocarditis Heart rate remains stable on dopamine infusion at present. Apparently patient's heart rate was low when the patient was sleeping and would improve while the patient would be active. Family requested the patient to be transferred to [facility] for immediate cardiology consultation. Cardiology started the patient on IV dopamine infusion at present. Repeat reduced today from 15-10 mics. Monitor. There was a question about cardiac sarcoidosis although pulmonary does not feel that the patient has any evidence of cardiac subcortical doses at present.  3. Acute hypoxic respiratory failure. Possible sarcoidosis flareup. acute on chronic diastolic dysfunction. Cardiology on board. Pulmonary following peripherally. Started on IV Solu Cortef. Currently unable to  tolerate Solu-Cortef and still remains nauseated and therefore switch to IV Solu-Medrol. Chest x-ray today shows worsening aeration. Patient remains somnolent as well. On 2 L of oxygen. Continue IV Lasix at present. Changing oral medications to IV given patient's somnolence. ABG yesterday did not show any acute abnormality. Continue close monitoring in the intensive care unit.  4. History of GERD. Patient on PPI daily. Given presentation with nausea and vomiting. PSH 2 twice a day. Given the patient remain nauseated at present I would switch to IV Pepcid.  5. Viral gastroenteritis. Patient presented with diarrhea as well as nausea and vomiting. Unable to tolerate by mouth steroids as an outpatient. Unable to tolerate by mouth still. C. difficile by PCR negative. Probable viral etiology contributing to viral myocarditis. Check x-ray of the abdomen  4. Hypokalemia. Replacing IV.  Pain management: When necessary Tylenol Activity: Will need physical therapy Bowel regimen: last BM 08/19/2016 Diet: Clear liquid diet DVT Prophylaxis: subcutaneous Heparin  Advance goals of care discussion: Full code  Family Communication: family was present at bedside, at the time of interview. The pt provided permission to discuss medical plan with the family. Opportunity was given to ask question and all questions were answered satisfactorily.   Disposition:  Discharge to home. Expected discharge date: 08/24/2016, improvement in clinical picture  Consultants: Cardiology, pulmonary critical care Procedures: Dopamine infusion  Antibiotics: Anti-infectives    None        Subjective: Denies any acute complaints, remains nauseated, minimally interactive and drowsy and sleepy  Objective: Physical Exam: Vitals:   08/20/16 0700 08/20/16 0715 08/20/16 0730 08/20/16 0800  BP: 137/67 (!) 144/59 (!) 141/58 (!) 147/66  Pulse: 66 68 67 68  Resp: 20 18 19 19   Temp:  97.7 F (36.5 C)  TempSrc:     Oral  SpO2: 99% 99% 99% 97%  Weight:      Height:        Intake/Output Summary (Last 24 hours) at 08/20/16 0937 Last data filed at 08/20/16 0900  Gross per 24 hour  Intake            362.4 ml  Output             6725 ml  Net          -6362.6 ml   Filed Weights   08/17/16 1926 08/18/16 0400 08/19/16 0353  Weight: 98.4 kg (216 lb 14.9 oz) 99.6 kg (219 lb 9.3 oz) 102.5 kg (225 lb 15.5 oz)    General: Drowsy and sleepy Appear in marked distress, affect appropriate Eyes: PERRL, Conjunctiva normal ENT: Oral Mucosa clear dry. Neck: no JVD, no Abnormal Mass Or lumps Cardiovascular: S1 and S2 Present, no Murmur, Respiratory: Bilateral Air entry equal and Decreased, no use of accessory muscle, bilateral Crackles, no wheezes Abdomen: Bowel Sound present, Soft and no tenderness Skin: no redness, no Rash, no induration Extremities: trace Pedal edema, no calf tenderness Neurologic: Grossly no focal neuro deficit. Bilaterally Equal motor strength Generalized weakness  Data Reviewed: CBC:  Recent Labs Lab 08/16/16 1316 08/17/16 1055 08/20/16 0314  WBC 6.5 5.0 13.8*  NEUTROABS  --  3.8  --   HGB 13.2 11.7* 14.8  HCT 39.6 35.2* 41.7  MCV 87.2 87.3 81.4  PLT 320 305 [id number]   Basic Metabolic Panel:  Recent Labs Lab 08/17/16 1055 08/18/16 0519 08/18/16 0900 08/19/16 0545 08/20/16 0314  NA 141 142 140 139 142  K 3.4* 4.2 4.0 3.7 3.2*  CL 112* 118* 117* 115* 100*  CO2 25 20* 17* 20* 31  GLUCOSE 87 104* 168* 104* 144*  BUN 6 <5* <5* 5* 5*  CREATININE 0.84 0.60 0.71 0.66 0.79  CALCIUM 8.8* 7.6* 7.8* 8.4* 9.5  MG  --   --   --  1.6* 1.8    Liver Function Tests:  Recent Labs Lab 08/16/16 1316 08/17/16 1055 08/18/16 0900  AST 17 13* 24  ALT 15 14 14   ALKPHOS 70 54 48  BILITOT 0.6 0.4 0.3  PROT 7.4 6.1* 5.8*  ALBUMIN 4.0 3.3* 3.1*    Recent Labs Lab 08/16/16 1316 08/17/16 1055  LIPASE 33 31   No results for input(s): AMMONIA in the last 168 hours. Coagulation  Profile: No results for input(s): INR, PROTIME in the last 168 hours. Cardiac Enzymes:  Recent Labs Lab 08/17/16 1055 08/19/16 0545  TROPONINI <0.03 <0.03   BNP (last 3 results) No results for input(s): PROBNP in the last 8760 hours.  CBG: No results for input(s): GLUCAP in the last 168 hours.  Studies: Dg Chest Port 1 View  Result Date: 08/20/2016 CLINICAL DATA:  Acute respiratory failure with hypoxia. EXAM: PORTABLE CHEST 1 VIEW COMPARISON:  1 day prior FINDINGS: Numerous leads and wires project over the chest. Patient rotated to the left. Cardiomegaly accentuated by AP portable technique. Small left pleural effusion is new or increased. No pneumothorax. Moderate interstitial edema, increased and accentuated by diminished lung volumes. Left base airspace disease. IMPRESSION: Worsening aeration with increased interstitial edema and decreased ventilation. New or increased left pleural effusion with adjacent airspace disease. Electronically Signed   By: Abigail Miyamoto M.D.   On: 08/20/2016 08:27   Dg Chest Port 1 View  Result Date: 08/19/2016 CLINICAL DATA:  Acute  shortness of breath for 2 days. History of sarcoidosis and pulmonary fibrosis. EXAM: PORTABLE CHEST 1 VIEW COMPARISON:  02/01/2016 FINDINGS: Cardiomegaly evident with increased vascular congestion and interstitial opacities suspicious for mild superimposed edema. Background chronic interstitial lung disease also evident as before. Hazy lung bases noted obscuring the hemidiaphragms suggesting small effusions. No pneumothorax. Trachea is midline. IMPRESSION: Slight increased diffuse interstitial opacities, suspect mild edema superimposed on chronic lung disease. Electronically Signed   By: Jerilynn Mages.  Shick M.D.   On: 08/19/2016 10:30     Scheduled Meds: . budesonide  0.5 mg Nebulization BID  . enoxaparin (LOVENOX) injection  40 mg Subcutaneous Q24H  . famotidine (PEPCID) IV  20 mg Intravenous Q12H  . ferrous sulfate  325 mg Oral Daily    . furosemide  20 mg Intravenous Q6H  . methylPREDNISolone (SOLU-MEDROL) injection  40 mg Intravenous Q12H  . nystatin  5 mL Oral QID  . phenazopyridine  100 mg Oral TID WC  . potassium chloride (KCL MULTIRUN) 30 mEq in 265 mL IVPB  30 mEq Intravenous Q4H  . [START ON 08/21/2016] sertraline  50 mg Oral Daily   Continuous Infusions: . DOPamine 10 mcg/kg/min (08/20/16 0823)   PRN Meds: acetaminophen **OR** [DISCONTINUED] acetaminophen, albuterol, alum & mag hydroxide-simeth, loperamide, [DISCONTINUED] ondansetron **OR** ondansetron (ZOFRAN) IV, prochlorperazine, promethazine, sodium chloride  Time spent: 30 minutes  Author: Berle Mull, MD [facility] Pager: [phone] 08/20/2016 9:37 AM  If 7PM-7AM, please contact night-coverage at [email or web address], password [facility]

## 2016-08-20 NOTE — Consult Note (Signed)
CARDIOLOGY CONSULT NOTE   Patient ID: Kathleen [name] MRN: [id number], DOB/AGE: 04-29-1961   Admit date: 08/17/2016 Date of Consult: 08/20/2016  Primary Physician: Sallee Lange, MD Primary Cardiologist: Dr Acie Fredrickson   Reason for consult:  Bradycardia  Problem List  Past Medical History:  Diagnosis Date  . Anemia   . Avascular bone necrosis ([place])   . Breast fibrocystic disorder   . Breast pain    right  . DOE (dyspnea on exertion)   . Fibromyalgia   . Gastroesophageal reflux disease   . Heart disease   . Mitral valve prolapse   . Normal echocardiogram December 2012   EF 55 to 60%; mild MVP, grade 1 diastolic dysfunction, PAP 32  . Normal nuclear stress test December 2012  . Normal stress echocardiogram Jan 2011  . Pulmonary fibrosis ([place])   . Sarcoidosis [facility])     Past Surgical History:  Procedure Laterality Date  . BREAST LUMPECTOMY  01/12/2011   right  . TUBAL LIGATION       Allergies  Allergies  Allergen Reactions  . Bee Venom Anaphylaxis  . Benadryl [Diphenhydramine Hcl] Nausea Only and Swelling    "makes my throat close up" "makes my throat close up"  . Codeine Itching    Other reaction(s): ITCHING  . Diphenhydramine Nausea Only    "makes my throat close up" "makes my throat close up"  . Morphine And Related Other (See Comments)    Unknown reaction- pt gets sick, dizzy, and confused Intolerance, not an allergy, received this in the emergency department recently without a problem.    HPI   Kathleen R Lawsonis a 55 y.o.femalewith a history of gastroesophageal reflux disease, fibromyalgia, sarcoidosis with pulmonary fibrosis, bipolar  Disease, stage 4 pulmonary fibrosis on chronic prednisone and pulmonary sarcoidosis who presented to [facility] with acute gastroenteritis - nausea/vomiting and diarrhea.  She was fluid resuscitated and went home, she went back after she felt progressively more SOB, now at erst and was found to be in profound sinus  bradycardia, down to 27 BPM while awake.  She is currently gasping for breath while talking, she has no chest pain. She is saturating 98% on RA.  She was started on hydrocortisone 100 mg Q8H for presumed adrenal insufficiency.  Her daughter states that similar episode happened to her approximately 6 months ago with nausea/vomiting and hypotension, bradycardia after initiation of high dose of steroids.   Inpatient Medications  . budesonide  0.5 mg Nebulization BID  . enoxaparin (LOVENOX) injection  40 mg Subcutaneous Q24H  . ferrous sulfate  325 mg Oral Daily  . furosemide  20 mg Intravenous Q6H  . hydrocortisone sod succinate (SOLU-CORTEF) inj  100 mg Intravenous Q8H  . magnesium sulfate 1 - 4 g bolus IVPB  2 g Intravenous Once  . pantoprazole  40 mg Oral BID  . phenazopyridine  100 mg Oral TID WC  . potassium chloride (KCL MULTIRUN) 30 mEq in 265 mL IVPB  30 mEq Intravenous Q4H  . sertraline  100 mg Oral Daily   Family History Family History  Problem Relation Age of Onset  . Cardiomyopathy Mother   . Breast cancer Mother   . Prostate cancer Father   . Neurofibromatosis Brother   . Heart disease Maternal Aunt   . Bipolar disorder Daughter   . Colon cancer Neg Hx     Social History Social History   Social History  . Marital status: Married    Spouse name:  N/A  . Number of children: 1  . Years of education: N/A   Occupational History  . [employer]   Social History Main Topics  . Smoking status: Never Smoker  . Smokeless tobacco: Never Used  . Alcohol use No  . Drug use: No  . Sexual activity: Yes   Other Topics Concern  . Not on file   Social History Narrative   Daily caffeine      Review of Systems  General:  No chills, fever, night sweats or weight changes.  Cardiovascular:  No chest pain, dyspnea on exertion, edema, orthopnea, palpitations, paroxysmal nocturnal dyspnea. Dermatological: No rash, lesions/masses Respiratory: No cough,  dyspnea Urologic: No hematuria, dysuria Abdominal:   No nausea, vomiting, diarrhea, bright red blood per rectum, melena, or hematemesis Neurologic:  No visual changes, wkns, changes in mental status. All other systems reviewed and are otherwise negative except as noted above.  Physical Exam  Blood pressure (!) 141/58, pulse 67, temperature 98.1 F (36.7 C), temperature source Oral, resp. rate 19, height 5\' 10"  (1.778 m), weight 225 lb 15.5 oz (102.5 kg), last menstrual period 05/17/2016, SpO2 99 %.  General: Pleasant, in mild respiratory distress Psych: Normal affect. Neuro: Alert and oriented X 3. Moves all extremities spontaneously. HEENT: Normal  Neck: Supple without bruits or JVD. Lungs:  Resp regular and unlabored, CTA. Heart: RRR no s3, s4, or murmurs. Abdomen: Soft, non-tender, non-distended, BS + x 4.  Extremities: No clubbing, cyanosis or edema. DP/PT/Radials 2+ and equal bilaterally.  Labs   Recent Labs  08/17/16 1055 08/19/16 0545  TROPONINI <0.03 <0.03   Lab Results  Component Value Date   WBC 13.8 (H) 08/20/2016   HGB 14.8 08/20/2016   HCT 41.7 08/20/2016   MCV 81.4 08/20/2016   PLT 337 08/20/2016    Recent Labs Lab 08/18/16 0900  08/20/16 0314  NA 140  < > 142  K 4.0  < > 3.2*  CL 117*  < > 100*  CO2 17*  < > 31  BUN <5*  < > 5*  CREATININE 0.71  < > 0.79  CALCIUM 7.8*  < > 9.5  PROT 5.8*  --   --   BILITOT 0.3  --   --   ALKPHOS 48  --   --   ALT 14  --   --   AST 24  --   --   GLUCOSE 168*  < > 144*  < > = values in this interval not displayed. Lab Results  Component Value Date   CHOL 221 (H) 07/27/2016   HDL 49 07/27/2016   LDLCALC 133 (H) 07/27/2016   TRIG 193 (H) 07/27/2016   Lab Results  Component Value Date   DDIMER 0.50 12/28/2015   Radiology/Studies  Dg Chest Port 1 View  Result Date: 08/19/2016 CLINICAL DATA:  Acute shortness of breath for 2 days. History of sarcoidosis and pulmonary fibrosis. EXAM: PORTABLE CHEST 1 VIEW  COMPARISON:  02/01/2016 FINDINGS: Cardiomegaly evident with increased vascular congestion and interstitial opacities suspicious for mild superimposed edema. Background chronic interstitial lung disease also evident as before. Hazy lung bases noted obscuring the hemidiaphragms suggesting small effusions. No pneumothorax. Trachea is midline. IMPRESSION: Slight increased diffuse interstitial opacities, suspect mild edema superimposed on chronic lung disease. Electronically Signed   By: Jerilynn Mages.  Shick M.D.   On: 08/19/2016 10:30   Echocardiogram - 12/29/2015  - Left ventricle: The cavity size was normal. Wall thickness was   normal.  Systolic function was normal. The estimated ejection   fraction was in the range of 60% to 65%. Wall motion was normal;   there were no regional wall motion abnormalities. Left   ventricular diastolic function parameters were normal. - Mitral valve: Mild systolic bowing without prolapse, involving   the anterior leaflet and the posterior leaflet. There was trivial   regurgitation. - Right atrium: Central venous pressure (est): 3 mm Hg. - Atrial septum: No defect or patent foramen ovale was identified. - Tricuspid valve: There was trivial regurgitation. - Pulmonary arteries: PA peak pressure: 35 mm Hg (S). - Pericardium, extracardiac: There was no pericardial effusion.  Impressions:  - Normal LV wall thickness with LVEF 60-65%. Normal diastolic   function. Mild systolic bowing of the mitral valve without frank   prolapse, trivial mitral regurgitation. Trivial tricuspid   rotation with PASP 35 mmHg. No pericardial effusion.  ECG: SR, negative T waves in the inferolateral leads, unchanged from prior ECGs     ASSESSMENT AND PLAN  1. SOB 2. Acute systolic CHF 3. Profound sinus bradycardia 4. Pulmonary fibrosis 5. Pulmnary sarcoidosis  This is a very complicated patient with h/o pulmonary sarcoidosis, stage 4 lung fibrosis on chronic steroid therapy and acute  gastroenteritis and acute bradycardia This is possibly an acute viral myocarditis - bedside echo shows LVEF mildly decreased 50-55%, however 60-65% in May 2017 Also RVEF is decreased, previously described as normal. The differential should also include cardiac sarcoidosis with involvement of conduction  system, she will need cardiac MRI once clinically improved.  Her clinical picture is also worsened by underlying lung disease.  She has improved on Dopamine.   Currently on Dop 15 mcg/kg/min Will decrease dose to 10 mcg/kg/min   Continue supportive care    Mertie Moores, MD  08/20/2016 8:20 AM    [facility] [address],  [address], [place]  [address] Pager [phone] Phone: [phone]; Fax: [phone]

## 2016-08-20 NOTE — Progress Notes (Signed)
  Amiodarone Drug - Drug Interaction Consult Note  Recommendations: Monitor QTc, Mg, and K while on amiodarone and ondansetron.   Amiodarone is metabolized by the cytochrome P450 system and therefore has the potential to cause many drug interactions. Amiodarone has an average plasma half-life of 50 days (range 20 to 100 days).   There is potential for drug interactions to occur several weeks or months after stopping treatment and the onset of drug interactions may be slow after initiating amiodarone.   []  Statins: Increased risk of myopathy. Simvastatin- restrict dose to 20mg  daily. Other statins: counsel patients to report any muscle pain or weakness immediately.  []  Anticoagulants: Amiodarone can increase anticoagulant effect. Consider warfarin dose reduction. Patients should be monitored closely and the dose of anticoagulant altered accordingly, remembering that amiodarone levels take several weeks to stabilize.  []  Antiepileptics: Amiodarone can increase plasma concentration of phenytoin, the dose should be reduced. Note that small changes in phenytoin dose can result in large changes in levels. Monitor patient and counsel on signs of toxicity.  []  Beta blockers: increased risk of bradycardia, AV block and myocardial depression. Sotalol - avoid concomitant use.  []   Calcium channel blockers (diltiazem and verapamil): increased risk of bradycardia, AV block and myocardial depression.  []   Cyclosporine: Amiodarone increases levels of cyclosporine. Reduced dose of cyclosporine is recommended.  []  Digoxin dose should be halved when amiodarone is started.  []  Diuretics: increased risk of cardiotoxicity if hypokalemia occurs.  []  Oral hypoglycemic agents (glyburide, glipizide, glimepiride): increased risk of hypoglycemia. Patient's glucose levels should be monitored closely when initiating amiodarone therapy.   [x]  Drugs that prolong the QT interval:  Torsades de pointes risk may be  increased with concurrent use - avoid if possible.  Monitor QTc, also keep magnesium/potassium WNL if concurrent therapy can't be avoided. Marland Kitchen Antibiotics: e.g. fluoroquinolones, erythromycin. . Antiarrhythmics: e.g. quinidine, procainamide, disopyramide, sotalol. . Antipsychotics: e.g. phenothiazines, haloperidol.  . Lithium, tricyclic antidepressants, and methadone.             .Ondansetron (prn only) Thank Leilani Merl, PharmD PGY1 Pharmacy Resident [phone] (Pager) 08/20/2016 9:31 PM

## 2016-08-21 ENCOUNTER — Inpatient Hospital Stay (HOSPITAL_COMMUNITY): Payer: 59

## 2016-08-21 DIAGNOSIS — E271 Primary adrenocortical insufficiency: Secondary | ICD-10-CM

## 2016-08-21 DIAGNOSIS — A419 Sepsis, unspecified organism: Secondary | ICD-10-CM

## 2016-08-21 DIAGNOSIS — I480 Paroxysmal atrial fibrillation: Secondary | ICD-10-CM

## 2016-08-21 DIAGNOSIS — R6521 Severe sepsis with septic shock: Secondary | ICD-10-CM

## 2016-08-21 LAB — COMPREHENSIVE METABOLIC PANEL
ALT: 16 U/L (ref 14–54)
AST: 25 U/L (ref 15–41)
Albumin: 3.6 g/dL (ref 3.5–5.0)
Alkaline Phosphatase: 63 U/L (ref 38–126)
Anion gap: 12 (ref 5–15)
BUN: 10 mg/dL (ref 6–20)
CO2: 26 mmol/L (ref 22–32)
Calcium: 8.6 mg/dL — ABNORMAL LOW (ref 8.9–10.3)
Chloride: 94 mmol/L — ABNORMAL LOW (ref 101–111)
Creatinine, Ser: 1.24 mg/dL — ABNORMAL HIGH (ref 0.44–1.00)
GFR calc Af Amer: 56 mL/min — ABNORMAL LOW (ref >60)
GFR calc non Af Amer: 49 mL/min — ABNORMAL LOW (ref >60)
Glucose, Bld: 218 mg/dL — ABNORMAL HIGH (ref 65–99)
Potassium: 2.5 mmol/L — CL (ref 3.5–5.1)
Sodium: 132 mmol/L — ABNORMAL LOW (ref 135–145)
Total Bilirubin: 0.5 mg/dL (ref 0.3–1.2)
Total Protein: 6.6 g/dL (ref 6.5–8.1)

## 2016-08-21 LAB — POCT I-STAT 3, ART BLOOD GAS (G3+)
Acid-base deficit: 2 mmol/L (ref 0.0–2.0)
Bicarbonate: 24 mmol/L (ref 20.0–28.0)
O2 Saturation: 99 %
Patient temperature: 99.5
TCO2: 25 mmol/L (ref 0–100)
pCO2 arterial: 44.7 mmHg (ref 32.0–48.0)
pH, Arterial: 7.34 — ABNORMAL LOW (ref 7.350–7.450)
pO2, Arterial: 151 mmHg — ABNORMAL HIGH (ref 83.0–108.0)

## 2016-08-21 LAB — CBC WITH DIFFERENTIAL/PLATELET
Basophils Absolute: 0 10*3/uL (ref 0.0–0.1)
Basophils Relative: 0 %
Eosinophils Absolute: 0 10*3/uL (ref 0.0–0.7)
Eosinophils Relative: 0 %
HCT: 37.5 % (ref 36.0–46.0)
Hemoglobin: 13 g/dL (ref 12.0–15.0)
Lymphocytes Relative: 9 %
Lymphs Abs: 1.7 10*3/uL (ref 0.7–4.0)
MCH: 28.8 pg (ref 26.0–34.0)
MCHC: 34.7 g/dL (ref 30.0–36.0)
MCV: 83.1 fL (ref 78.0–100.0)
Monocytes Absolute: 1.2 10*3/uL — ABNORMAL HIGH (ref 0.1–1.0)
Monocytes Relative: 6 %
Neutro Abs: 17 10*3/uL — ABNORMAL HIGH (ref 1.7–7.7)
Neutrophils Relative %: 85 %
Platelets: 410 10*3/uL — ABNORMAL HIGH (ref 150–400)
RBC: 4.51 MIL/uL (ref 3.87–5.11)
RDW: 14.4 % (ref 11.5–15.5)
WBC: 19.9 10*3/uL — ABNORMAL HIGH (ref 4.0–10.5)

## 2016-08-21 LAB — BASIC METABOLIC PANEL
Anion gap: 9 (ref 5–15)
BUN: 9 mg/dL (ref 6–20)
CO2: 25 mmol/L (ref 22–32)
Calcium: 7.9 mg/dL — ABNORMAL LOW (ref 8.9–10.3)
Chloride: 102 mmol/L (ref 101–111)
Creatinine, Ser: 0.99 mg/dL (ref 0.44–1.00)
GFR calc Af Amer: >60 mL/min (ref >60)
GFR calc non Af Amer: >60 mL/min (ref >60)
Glucose, Bld: 205 mg/dL — ABNORMAL HIGH (ref 65–99)
Potassium: 3.6 mmol/L (ref 3.5–5.1)
Sodium: 136 mmol/L (ref 135–145)

## 2016-08-21 LAB — GLUCOSE, CAPILLARY
Glucose-Capillary: 197 mg/dL — ABNORMAL HIGH (ref 65–99)
Glucose-Capillary: 154 mg/dL — ABNORMAL HIGH (ref 65–99)
Glucose-Capillary: 202 mg/dL — ABNORMAL HIGH (ref 65–99)
Glucose-Capillary: 273 mg/dL — ABNORMAL HIGH (ref 65–99)

## 2016-08-21 LAB — URINALYSIS, ROUTINE W REFLEX MICROSCOPIC
Bilirubin Urine: NEGATIVE
Glucose, UA: 50 mg/dL — AB
Hgb urine dipstick: NEGATIVE
Ketones, ur: 5 mg/dL — AB
Leukocytes, UA: NEGATIVE
Nitrite: NEGATIVE
Protein, ur: 100 mg/dL — AB
Specific Gravity, Urine: 1.021 (ref 1.005–1.030)
Squamous Epithelial / LPF: NONE SEEN
pH: 5 (ref 5.0–8.0)

## 2016-08-21 LAB — COOXEMETRY PANEL
Carboxyhemoglobin: 1.3 % (ref 0.5–1.5)
Methemoglobin: 1.2 % (ref 0.0–1.5)
O2 Saturation: 64.1 %
Total hemoglobin: 11.2 g/dL — ABNORMAL LOW (ref 12.0–16.0)

## 2016-08-21 LAB — D-DIMER, QUANTITATIVE: D-Dimer, Quant: 0.53 ug/mL-FEU — ABNORMAL HIGH (ref 0.00–0.50)

## 2016-08-21 LAB — HEMOGLOBIN A1C
Hgb A1c MFr Bld: 5.3 % (ref 4.8–5.6)
Mean Plasma Glucose: 105 mg/dL

## 2016-08-21 LAB — HEPARIN LEVEL (UNFRACTIONATED)
Heparin Unfractionated: <0.1 IU/mL — ABNORMAL LOW (ref 0.30–0.70)
Heparin Unfractionated: 0.46 IU/mL (ref 0.30–0.70)

## 2016-08-21 LAB — MAGNESIUM
Magnesium: 1.3 mg/dL — ABNORMAL LOW (ref 1.7–2.4)
Magnesium: 2.6 mg/dL — ABNORMAL HIGH (ref 1.7–2.4)

## 2016-08-21 LAB — TROPONIN I: Troponin I: 1.66 ng/mL (ref <0.03)

## 2016-08-21 LAB — LACTATE DEHYDROGENASE: LDH: 173 U/L (ref 98–192)

## 2016-08-21 LAB — STREP PNEUMONIAE URINARY ANTIGEN: Strep Pneumo Urinary Antigen: NEGATIVE

## 2016-08-21 LAB — PROCALCITONIN: Procalcitonin: 0.26 ng/mL

## 2016-08-21 MED ORDER — INSULIN ASPART 100 UNIT/ML ~~LOC~~ SOLN: 0.0000 [IU] | SUBCUTANEOUS | 11 refills | Status: DC

## 2016-08-21 MED ORDER — NOREPINEPHRINE BITARTRATE 1 MG/ML IV SOLN: 2.0000 ug/min | INTRAVENOUS | Status: DC

## 2016-08-21 MED ORDER — MIDAZOLAM HCL 2 MG/2ML IJ SOLN
INTRAMUSCULAR | Status: AC
  Filled 2016-08-21: qty 2

## 2016-08-21 MED ORDER — VANCOMYCIN HCL 10 G IV SOLR
2000.0000 mg | Freq: Once | INTRAVENOUS | Status: AC
  Administered 2016-08-21: 2000 mg via INTRAVENOUS
  Filled 2016-08-21: qty 2000

## 2016-08-21 MED ORDER — SODIUM CHLORIDE 0.9 % IV BOLUS (SEPSIS)
1000.0000 mL | Freq: Once | INTRAVENOUS | Status: AC
Start: 2016-08-21 — End: 2016-08-21
  Administered 2016-08-21: 1000 mL via INTRAVENOUS

## 2016-08-21 MED ORDER — AMIODARONE HCL IN DEXTROSE 360-4.14 MG/200ML-% IV SOLN
30.0000 mg/h | INTRAVENOUS | Status: DC
Start: 2016-08-21 — End: 2016-09-18

## 2016-08-21 MED ORDER — INSULIN ASPART 100 UNIT/ML ~~LOC~~ SOLN
0.0000 [IU] | SUBCUTANEOUS | Status: DC
Start: 2016-08-21 — End: 2016-08-22
  Administered 2016-08-21 (×2): 2 [IU] via SUBCUTANEOUS
  Administered 2016-08-21: 5 [IU] via SUBCUTANEOUS

## 2016-08-21 MED ORDER — FENTANYL CITRATE (PF) 100 MCG/2ML IJ SOLN: 25.0000 ug | INTRAMUSCULAR | 0 refills | Status: DC | PRN

## 2016-08-21 MED ORDER — FENTANYL CITRATE (PF) 100 MCG/2ML IJ SOLN
INTRAMUSCULAR | Status: AC
  Filled 2016-08-21: qty 2

## 2016-08-21 MED ORDER — POTASSIUM CHLORIDE 2 MEQ/ML IV SOLN
Freq: Once | INTRAVENOUS | Status: AC
  Filled 2016-08-21: qty 1000

## 2016-08-21 MED ORDER — VASOPRESSIN 20 UNIT/ML IV SOLN: 0.0400 [IU]/min | INTRAVENOUS | Status: DC

## 2016-08-21 MED ORDER — ALBUTEROL SULFATE (2.5 MG/3ML) 0.083% IN NEBU: 2.5000 mg | INHALATION_SOLUTION | RESPIRATORY_TRACT | 12 refills | Status: DC | PRN

## 2016-08-21 MED ORDER — MIDAZOLAM HCL 2 MG/2ML IJ SOLN: 4.0000 mg | Freq: Once | INTRAMUSCULAR | Status: DC

## 2016-08-21 MED ORDER — VANCOMYCIN HCL IN DEXTROSE 1-5 GM/200ML-% IV SOLN: 1000.0000 mg | Freq: Two times a day (BID) | INTRAVENOUS | Status: DC

## 2016-08-21 MED ORDER — CHLORHEXIDINE GLUCONATE 0.12% ORAL RINSE (MEDLINE KIT)
15.0000 mL | Freq: Two times a day (BID) | OROMUCOSAL | Status: DC
  Administered 2016-08-21: 15 mL via OROMUCOSAL

## 2016-08-21 MED ORDER — SODIUM CHLORIDE 0.9% FLUSH
10.0000 mL | INTRAVENOUS | Status: DC | PRN
Start: 2016-08-21 — End: 2016-08-22

## 2016-08-21 MED ORDER — HEPARIN (PORCINE) IN NACL 100-0.45 UNIT/ML-% IJ SOLN: 1300.0000 [IU]/h | INTRAMUSCULAR | Status: DC

## 2016-08-21 MED ORDER — MAGNESIUM SULFATE 2 GM/50ML IV SOLN
2.0000 g | Freq: Once | INTRAVENOUS | Status: AC
  Administered 2016-08-21: 2 g via INTRAVENOUS
  Filled 2016-08-21: qty 50

## 2016-08-21 MED ORDER — MIDAZOLAM HCL 2 MG/2ML IJ SOLN: 1.0000 mg | INTRAMUSCULAR | 0 refills | Status: DC | PRN

## 2016-08-21 MED ORDER — PIPERACILLIN-TAZOBACTAM 3.375 G IVPB
3.3750 g | Freq: Three times a day (TID) | INTRAVENOUS | Status: DC
  Administered 2016-08-21 (×2): 3.375 g via INTRAVENOUS
  Filled 2016-08-21 (×3): qty 50

## 2016-08-21 MED ORDER — POTASSIUM CHLORIDE 20 MEQ/15ML (10%) PO SOLN: 40.0000 meq | ORAL | Status: DC

## 2016-08-21 MED ORDER — ORAL CARE MOUTH RINSE
15.0000 mL | Freq: Four times a day (QID) | OROMUCOSAL | Status: DC
  Administered 2016-08-22: 15 mL via OROMUCOSAL

## 2016-08-21 MED ORDER — ETOMIDATE 2 MG/ML IV SOLN
30.0000 mg | Freq: Once | INTRAVENOUS | Status: AC
  Administered 2016-08-21: 30 mg via INTRAVENOUS

## 2016-08-21 MED ORDER — ACETAMINOPHEN 325 MG PO TABS: 650.0000 mg | ORAL_TABLET | Freq: Four times a day (QID) | ORAL | Status: DC | PRN

## 2016-08-21 MED ORDER — SODIUM CHLORIDE 0.9 % IV SOLN
30.0000 meq | Freq: Once | INTRAVENOUS | Status: DC
  Filled 2016-08-21: qty 15

## 2016-08-21 MED ORDER — SODIUM CHLORIDE 0.9% FLUSH
10.0000 mL | Freq: Two times a day (BID) | INTRAVENOUS | Status: DC
Start: 2016-08-21 — End: 2016-08-22
  Administered 2016-08-21 (×2): 10 mL

## 2016-08-21 MED ORDER — BUDESONIDE 0.5 MG/2ML IN SUSP: 0.5000 mg | Freq: Every day | RESPIRATORY_TRACT | 12 refills | Status: DC | PRN

## 2016-08-21 MED ORDER — PIPERACILLIN-TAZOBACTAM 3.375 G IVPB
3.3750 g | Freq: Three times a day (TID) | INTRAVENOUS | Status: DC
Start: 2016-08-21 — End: 2016-09-18

## 2016-08-21 MED ORDER — HYDROCORTISONE NA SUCCINATE PF 100 MG IJ SOLR
50.0000 mg | Freq: Four times a day (QID) | INTRAMUSCULAR | Status: DC
  Administered 2016-08-21 (×3): 50 mg via INTRAVENOUS
  Filled 2016-08-21 (×3): qty 2

## 2016-08-21 MED ORDER — FENTANYL CITRATE (PF) 100 MCG/2ML IJ SOLN
25.0000 ug | INTRAMUSCULAR | Status: DC | PRN
  Administered 2016-08-21 – 2016-08-22 (×3): 50 ug via INTRAVENOUS
  Filled 2016-08-21 (×2): qty 2

## 2016-08-21 MED ORDER — SODIUM CHLORIDE 0.9 % IV SOLN
Freq: Once | INTRAVENOUS | Status: AC
  Administered 2016-08-21: 05:00:00 via INTRAVENOUS
  Filled 2016-08-21: qty 1000

## 2016-08-21 MED ORDER — HYDROCORTISONE NA SUCCINATE PF 100 MG IJ SOLR: 50.0000 mg | Freq: Four times a day (QID) | INTRAMUSCULAR | Status: DC

## 2016-08-21 MED ORDER — FAMOTIDINE IN NACL 20-0.9 MG/50ML-% IV SOLN
20.0000 mg | Freq: Two times a day (BID) | INTRAVENOUS | Status: DC
Start: 2016-08-21 — End: 2016-09-18

## 2016-08-21 MED ORDER — PIPERACILLIN-TAZOBACTAM 3.375 G IVPB 30 MIN
3.3750 g | Freq: Once | INTRAVENOUS | Status: AC
  Administered 2016-08-21: 3.375 g via INTRAVENOUS
  Filled 2016-08-21 (×2): qty 50

## 2016-08-21 MED ORDER — SULFAMETHOXAZOLE-TRIMETHOPRIM 400-80 MG/5ML IV SOLN: 455.0000 mg | Freq: Four times a day (QID) | INTRAVENOUS | Status: DC

## 2016-08-21 MED ORDER — NOREPINEPHRINE BITARTRATE 1 MG/ML IV SOLN
2.0000 ug/min | INTRAVENOUS | Status: DC
  Administered 2016-08-21: 45 ug/min via INTRAVENOUS
  Administered 2016-08-21: 35 ug/min via INTRAVENOUS
  Administered 2016-08-21 (×2): 55 ug/min via INTRAVENOUS
  Filled 2016-08-21 (×4): qty 16

## 2016-08-21 MED ORDER — MIDAZOLAM HCL 2 MG/2ML IJ SOLN
1.0000 mg | INTRAMUSCULAR | Status: DC | PRN
  Administered 2016-08-21: 2 mg via INTRAVENOUS
  Administered 2016-08-21: 1 mg via INTRAVENOUS
  Administered 2016-08-22: 2 mg via INTRAVENOUS
  Filled 2016-08-21 (×3): qty 2

## 2016-08-21 MED ORDER — VANCOMYCIN HCL IN DEXTROSE 1-5 GM/200ML-% IV SOLN
1000.0000 mg | Freq: Two times a day (BID) | INTRAVENOUS | Status: DC
  Administered 2016-08-21: 1000 mg via INTRAVENOUS
  Filled 2016-08-21: qty 200

## 2016-08-21 MED ORDER — SULFAMETHOXAZOLE-TRIMETHOPRIM 400-80 MG/5ML IV SOLN
455.0000 mg | Freq: Four times a day (QID) | INTRAVENOUS | Status: DC
  Administered 2016-08-21: 455 mg via INTRAVENOUS
  Filled 2016-08-21 (×3): qty 28.4

## 2016-08-21 MED ORDER — HEPARIN BOLUS VIA INFUSION
2000.0000 [IU] | Freq: Once | INTRAVENOUS | Status: AC
  Administered 2016-08-21: 2000 [IU] via INTRAVENOUS
  Filled 2016-08-21: qty 2000

## 2016-08-21 MED ORDER — ROCURONIUM BROMIDE 50 MG/5ML IV SOLN
60.0000 mg | Freq: Once | INTRAVENOUS | Status: AC
  Administered 2016-08-21: 60 mg via INTRAVENOUS
  Filled 2016-08-21: qty 6

## 2016-08-21 NOTE — Progress Notes (Signed)
  Patient seen at request of Dr. Acie Fredrickson.   On my arrival, patient and family speaking with Dr. Ashok Cordia.   Her respiratory status is much worse after volume loading for presumed sepsis with CVP 1. Still on high-dose pressors. Agree with Dr. Ammie Dalton recommendation for intubation and transfer to [facility] at family's request. Can give careful trial of low-dose lasix as BP tolerates.   Cardiology team will continue to follow and support as needed. If remains here we will consider R/L heart cath and cMRI as tolerated.   Kathleen Scripter,MD 5:33 PM

## 2016-08-21 NOTE — Progress Notes (Signed)
ANTICOAGULATION CONSULT NOTE - Follow Up Consult  Pharmacy Consult for Heparin Indication: chest pain/ACS  Allergies  Allergen Reactions  . Bee Venom Anaphylaxis  . Benadryl [Diphenhydramine Hcl] Nausea Only and Swelling    "makes my throat close up" "makes my throat close up"  . Codeine Itching    Other reaction(s): ITCHING  . Diphenhydramine Nausea Only    "makes my throat close up" "makes my throat close up"  . Morphine And Related Other (See Comments)    Unknown reaction- pt gets sick, dizzy, and confused Intolerance, not an allergy, received this in the emergency department recently without a problem.    Patient Measurements: Height: 5\' 10"  (177.8 cm) Weight: 225 lb 15.5 oz (102.5 kg) IBW/kg (Calculated) : 68.5 Heparin Dosing Weight: 90 kg  Vital Signs: Temp: 98 F (36.7 C) (12/26 0000) Temp Source: Oral (12/26 0000) BP: 137/118 (12/26 0130) Pulse Rate: 88 (12/26 0145)  Labs:  Recent Labs  08/18/16 0900 08/19/16 0545 08/20/16 0314 08/20/16 1518 08/20/16 2045 08/21/16 0054  HGB  --   --  14.8  --   --   --   HCT  --   --  41.7  --   --   --   PLT  --   --  337  --   --   --   HEPARINUNFRC  --   --   --   --   --  <0.10*  CREATININE 0.71 0.66 0.79  --   --   --   TROPONINI  --  <0.03  --  0.10* 0.47*  --     Estimated Creatinine Clearance: 105.4 mL/min (by C-G formula based on SCr of 0.79 mg/dL).  Assessment: 55 y.o. female with elevated cardiac makers and EKG changes for heparin.  Heparin paused for ~ 1 hour for central line placement  Goal of Therapy:  Heparin level 0.3-0.7 units/ml Monitor platelets by anticoagulation protocol: Yes   Plan:  Heparin 2000 units IV bolus, then increase heparin  1300 units/hr Check heparin level in 6 hours.   Betania Dizon, Bronson Curb 08/21/2016,1:51 AM

## 2016-08-21 NOTE — Progress Notes (Signed)
  Amiodarone Drug - Drug Interaction Consult Note  Recommendations: Current medications have minimal interaction with amiodarone. Will follow for any medication additions that warrant dose adjustments or alerts. Amiodarone is metabolized by the cytochrome P450 system and therefore has the potential to cause many drug interactions. Amiodarone has an average plasma half-life of 50 days (range 20 to 100 days).   There is potential for drug interactions to occur several weeks or months after stopping treatment and the onset of drug interactions may be slow after initiating amiodarone.   []  Statins: Increased risk of myopathy. Simvastatin- restrict dose to 20mg  daily. Other statins: counsel patients to report any muscle pain or weakness immediately.  []  Anticoagulants: Amiodarone can increase anticoagulant effect. Consider warfarin dose reduction. Patients should be monitored closely and the dose of anticoagulant altered accordingly, remembering that amiodarone levels take several weeks to stabilize.  []  Antiepileptics: Amiodarone can increase plasma concentration of phenytoin, the dose should be reduced. Note that small changes in phenytoin dose can result in large changes in levels. Monitor patient and counsel on signs of toxicity.  []  Beta blockers: increased risk of bradycardia, AV block and myocardial depression. Sotalol - avoid concomitant use.  []   Calcium channel blockers (diltiazem and verapamil): increased risk of bradycardia, AV block and myocardial depression.  []   Cyclosporine: Amiodarone increases levels of cyclosporine. Reduced dose of cyclosporine is recommended.  []  Digoxin dose should be halved when amiodarone is started.  []  Diuretics: increased risk of cardiotoxicity if hypokalemia occurs.  []  Oral hypoglycemic agents (glyburide, glipizide, glimepiride): increased risk of hypoglycemia. Patient's glucose levels should be monitored closely when initiating amiodarone therapy.    []  Drugs that prolong the QT interval:  Torsades de pointes risk may be increased with concurrent use - avoid if possible.  Monitor QTc, also keep magnesium/potassium WNL if concurrent therapy can't be avoided. Marland Kitchen Antibiotics: e.g. fluoroquinolones, erythromycin. . Antiarrhythmics: e.g. quinidine, procainamide, disopyramide, sotalol. . Antipsychotics: e.g. phenothiazines, haloperidol.  . Lithium, tricyclic antidepressants, and methadone.  Thank You,  Melburn Popper  08/21/2016 10:07 AM

## 2016-08-21 NOTE — Progress Notes (Addendum)
MD Nahser paged and notified of increase in levophed & vasopressin along with co-ox results. Order for d-dimer given. Will continue to monitor.

## 2016-08-21 NOTE — Progress Notes (Signed)
PCCM Phone Transfer Discussion: Spoke with Dr. Azucena Cecil of [facility] relaying the patient's medical condition. She agrees that the safest transport for the patient would be to electively intubate prior to transport. She is willing to accept the patient and they will notify our ICU once a bed is available allowing [place] to arrange for transport.  Sonia Baller Ashok Cordia, M.D. [facility] Pulmonary & Critical Care Pager:  [phone] After 3pm or if no response, call [phone] 6:08 PM 08/21/16

## 2016-08-21 NOTE — Progress Notes (Signed)
Patient Name: Kathleen [name] Date of Encounter: 08/21/2016  Primary Cardiologist: [facility] Problem List     Principal Problem:   Adrenal insufficiency (Addison's disease) [facility]) Active Problems:   Sarcoidosis ([place])   Hypotension   Gastroenteritis   Hypokalemia   Acute respiratory failure with hypoxia (HCC)   Elevated troponin   Symptomatic bradycardia     Subjective   55 y.o.femalewith a history of gastroesophageal reflux disease, fibromyalgia, sarcoidosis with pulmonary fibrosis, bipolar  Disease, stage 4 pulmonary fibrosis on chronic prednisone and pulmonary sarcoidosis who presented to [facility] with acute gastroenteritis - nausea/vomiting and diarrhea  She developed atrial fib yesterday. Was started on amio and is now in NSR BP is still low. Doesn't feel well.    Inpatient Medications    Scheduled Meds: . amiodarone      . aspirin  81 mg Oral Daily  . budesonide  0.5 mg Nebulization BID  . famotidine (PEPCID) IV  20 mg Intravenous Q12H  . ferrous sulfate  325 mg Oral Daily  . magnesium sulfate 1 - 4 g bolus IVPB  2 g Intravenous Once  . methylPREDNISolone (SOLU-MEDROL) injection  20 mg Intravenous Daily  . nystatin  5 mL Oral QID  . phenazopyridine  100 mg Oral TID WC  . sertraline  50 mg Oral Daily  . 0.9 % sodium chloride with kcl   Intravenous Once  . sodium chloride flush  10-40 mL Intracatheter Q12H   Continuous Infusions: . amiodarone 30 mg/hr (08/21/16 0700)  . heparin 1,300 Units/hr (08/21/16 0700)  . norepinephrine (LEVOPHED) Adult infusion 45 mcg/min (08/21/16 0817)  . vasopressin (PITRESSIN) infusion - *FOR SHOCK* 0.03 Units/min (08/21/16 0700)   PRN Meds: acetaminophen **OR** [DISCONTINUED] acetaminophen, albuterol, alum & mag hydroxide-simeth, loperamide, [DISCONTINUED] ondansetron **OR** ondansetron (ZOFRAN) IV, promethazine, sodium chloride, sodium chloride flush   Vital Signs    Vitals:   08/21/16 0758 08/21/16  0800 08/21/16 0815 08/21/16 0836  BP: (!) 87/48 (!) 87/48 (!) 86/45   Pulse: 75 75 87   Resp: (!) 30 (!) 23 (!) 24   Temp:    97.6 F (36.4 C)  TempSrc:    Oral  SpO2: 95% 93% 93%   Weight:      Height:        Intake/Output Summary (Last 24 hours) at 08/21/16 0844 Last data filed at 08/21/16 0800  Gross per 24 hour  Intake          2764.71 ml  Output             1450 ml  Net          1314.71 ml   Filed Weights   08/17/16 1926 08/18/16 0400 08/19/16 0353  Weight: 216 lb 14.9 oz (98.4 kg) 219 lb 9.3 oz (99.6 kg) 225 lb 15.5 oz (102.5 kg)    Physical Exam    GEN: Well nourished, well developed, in no acute distress.  HEENT: Grossly normal.  Neck: Supple, no JVD, carotid bruits, or masses. Cardiac: RRR,  [id number] systolic murmur  Respiratory:  Respirations regular and unlabored, clear to auscultation bilaterally. GI: Soft, nontender, nondistended, BS + x 4. MS: no deformity or atrophy. Skin: warm and dry, no rash. Neuro:  Strength and sensation are intact. Psych: AAOx3.  Normal affect.  Labs    CBC  Recent Labs  08/20/16 0314 08/21/16 0305  WBC 13.8* 19.9*  NEUTROABS  --  17.0*  HGB 14.8 13.0  HCT 41.7  37.5  MCV 81.4 83.1  PLT 337 [id number]*   Basic Metabolic Panel  Recent Labs  08/20/16 0314 08/21/16 0305  NA 142 132*  K 3.2* 2.5*  CL 100* 94*  CO2 31 26  GLUCOSE 144* 218*  BUN 5* 10  CREATININE 0.79 1.24*  CALCIUM 9.5 8.6*  MG 1.8 1.3*   Liver Function Tests  Recent Labs  08/18/16 0900 08/21/16 0305  AST 24 25  ALT 14 16  ALKPHOS 48 63  BILITOT 0.3 0.5  PROT 5.8* 6.6  ALBUMIN 3.1* 3.6   No results for input(s): LIPASE, AMYLASE in the last 72 hours. Cardiac Enzymes  Recent Labs  08/20/16 1518 08/20/16 2045 08/21/16 0305  TROPONINI 0.10* 0.47* 1.66*   BNP Invalid input(s): POCBNP D-Dimer No results for input(s): DDIMER in the last 72 hours. Hemoglobin A1C No results for input(s): HGBA1C in the last 72 hours. Fasting Lipid Panel No  results for input(s): CHOL, HDL, LDLCALC, TRIG, CHOLHDL, LDLDIRECT in the last 72 hours. Thyroid Function Tests  Recent Labs  08/18/16 0856  TSH 0.463    Telemetry    NSR  - Personally Reviewed  ECG    NSR  - Personally Reviewed  Radiology    Dg Chest Port 1 View  Result Date: 08/20/2016 CLINICAL DATA:  Central line placement EXAM: PORTABLE CHEST 1 VIEW COMPARISON:  608 hours on the same day FINDINGS: Portable semi upright view of the chest demonstrates a new right IJ central line catheter with tip at the cavoatrial junction. Heart is top-normal in size. Interstitial edema persists. Probable small left effusion. External defibrillator and cardiac monitoring devices are noted. IMPRESSION: No pneumothorax status post right IJ central line catheter. Tip noted at the cavoatrial junction. Persistent mild interstitial edema and small left effusion. Electronically Signed   By: Ashley Royalty M.D.   On: 08/20/2016 23:44   Dg Chest Port 1 View  Result Date: 08/20/2016 CLINICAL DATA:  Acute respiratory failure with hypoxia. EXAM: PORTABLE CHEST 1 VIEW COMPARISON:  1 day prior FINDINGS: Numerous leads and wires project over the chest. Patient rotated to the left. Cardiomegaly accentuated by AP portable technique. Small left pleural effusion is new or increased. No pneumothorax. Moderate interstitial edema, increased and accentuated by diminished lung volumes. Left base airspace disease. IMPRESSION: Worsening aeration with increased interstitial edema and decreased ventilation. New or increased left pleural effusion with adjacent airspace disease. Electronically Signed   By: Abigail Miyamoto M.D.   On: 08/20/2016 08:27   Dg Chest Port 1 View  Result Date: 08/19/2016 CLINICAL DATA:  Acute shortness of breath for 2 days. History of sarcoidosis and pulmonary fibrosis. EXAM: PORTABLE CHEST 1 VIEW COMPARISON:  02/01/2016 FINDINGS: Cardiomegaly evident with increased vascular congestion and interstitial  opacities suspicious for mild superimposed edema. Background chronic interstitial lung disease also evident as before. Hazy lung bases noted obscuring the hemidiaphragms suggesting small effusions. No pneumothorax. Trachea is midline. IMPRESSION: Slight increased diffuse interstitial opacities, suspect mild edema superimposed on chronic lung disease. Electronically Signed   By: Jerilynn Mages.  Shick M.D.   On: 08/19/2016 10:30   Dg Abd Portable 1v  Result Date: 08/20/2016 CLINICAL DATA:  Nausea and vomiting. EXAM: PORTABLE ABDOMEN - 1 VIEW COMPARISON:  02/01/2016 and chest x-ray earlier today. MRI pelvis 01/04/2015. FINDINGS: Bowel gas pattern is nonobstructive. No abdominal mass or mass effect. No abnormal calcifications. Couple left-sided pelvic phleboliths. Known avascular necrosis of the left femoral head with possible subchondral fracture. Small amount left pleural fluid. IMPRESSION:  Nonobstructive bowel gas pattern. Known avascular necrosis of the left femoral head with possible subchondral fracture. Small left pleural effusion. Electronically Signed   By: Marin Olp M.D.   On: 08/20/2016 10:41    Cardiac Studies     Patient Profile     55 y.o.femalewith a history of gastroesophageal reflux disease, fibromyalgia, sarcoidosis with pulmonary fibrosis, bipolar  Disease, stage 4 pulmonary fibrosis on chronic prednisone and pulmonary sarcoidosis who presented to [facility] with acute gastroenteritis - nausea/vomiting and diarrhea  Assessment & Plan    1.  Dyspnea:   Has pulmonary sarcoidosis,  Stage 4 lung fibrosis - on chronic steroids Further plans per Dr. Ashok Cordia  2. Atrial fib:   Has converted to NSR with amio.   Continue drip for now Has had bradycardia in the past.   ? May have cardiac sarcoidosis of the conduction system .   3. Hypotension:   Further plans per PCCM    Signed, Mertie Moores, MD  08/21/2016, 8:44 AM

## 2016-08-21 NOTE — Discharge Summary (Signed)
Physician Discharge Summary       Patient ID: Kathleen [name] MRN: [id number] DOB/AGE: 31-Dec-1960 55 y.o.  Admit date: 08/17/2016 Discharge date: 08/21/2016  Discharge Diagnoses:  Principal Problem:   Acute respiratory failure with hypoxia ([place]) Active Problems:   Sarcoidosis (HCC)   Hypotension   Gastroenteritis   Adrenal insufficiency (Addison's disease) (HCC)   Hypokalemia   Elevated troponin   Symptomatic bradycardia   Septic shock (HCC)   History of Present Illness:  55 y/o F with PMH of anemia, fibromyalgia, GERD, mitral valve prolapse, sarcoidosis (biopsy proven at [facility]) and pulmonary fibrosis on chronic prednisone. She is followed by Dr. Farley Ly at [facility] for pulmonary issues who was admitted to [facility] on 12/22 for gastroenteritis (nausea / vomiting and poor PO intake) which led to mild adrenal crisis as she could not take PO prednisone.  She was stabilized with IV fluids and steroids.  C-Diff was ruled out.  She was found to have bradycardia and requested transfer to [facility] for evaluation by Cardiology.  She was also noted to have a new O2 requirement of 2L.   CXR showed mild edema superimposed on IPF.  She was treated with IV lasix with improvement in respiratory status. Hospital course complicated by profound bradycardia (sinus, 27bpm) requiring dopamine. She was transferred to [facility] for this reason.  Hospital Course:  Upon arrival to [facility] her heart rate had improved on dopamine, but she remained hypoxemic, which improved with supplemental O2. Course then complicated by atrail fibrillation with RVR 12/25 PM. Dopamine was stopped and amiodarone initiated with return to NSR. However, she then became hypotensive progressing to shock and hypoxemia progressed. No clear etiology has been identified, however, this is thought to be the result of worsening pulmonary hypertension coupled with acute on chronic respiratory failure in the setting of viral/bacterial  pneumonia. She has required an escalating dose of norepinephrine (now 50mcg/kg/min), shock dose vasopressin, and increasing O2 requirement (now 12L from baseline 0). Based on this constellation of issues and now a significant worsening in WOB it has been recommended that she undergo intubation for ventilatory support as we continue to treat her. She has requested a to be transferred to her primary pulmonologist at [facility].    Discharge Plan by active problems   Acute on chronic hypoxemic respiratory failure - multifactorial in the setting of sarcoidosis, pulmonary fibrosis, and now with likely pulmonary edema developing (PAH noted on echo 12/24) and possible pneumonia/pneumonitis. Diuresis limited by shock. Antibiotics started 12/26 (Zosyn & Vancomycin) with cultures pending. Will plan to intubate and transfer to her pulmonology team at [facility].   Shock - again likely multifactorial. Suspect largely cardiogenic however septic possible. Immunocompromised host. Continue norepinephrine for MAP goal 73mmHg, shock dose vasopressin as well. CVP has been low (<5) and BP responds to volume, but respiratory status then worsens. Will have to [facility] IVF for this reason. Stress dose steroids (solu-cortef) initiated.   Atrial fibrillation with RVR - rate/rhythm controlled with amiodarone. Heparin infusion  Acute renal failure secondary to shock and diuresis - holding duresis, follow BMP and urine output.   Viral gastroenteritis - Continue NPO status, Zofran PRN for nausea    Significant Hospital tests/ studies   STUDIES:  TTE 12/24: LVEF 50-55%, normal wall motion, mild diffuse hypokinesis, grade 2 diastolic dysfunction, PA peak 54  MICROBIOLOGY: MRSA PCR 12/22:  Negative Blood Ctx x2 12/26 >> U/A 12/26 >> Urine Ctx 12/26 >> Urine legionella 12/26 > Urine strep antigen  12/26 > Respiratory viral panel > negative Beta D Glucan 12/26  ANTIBIOTICS: Vancomycin 12/26 >> Zosyn 12/26 >> Bactrim 12/26  >   SIGNIFICANT EVENTS: 12/22 -Admit with viral gastroenteritis, adrenal insufficiency related to poor intake / inability to take prednisone 12/23 - Improvement with IVF's / IV steroids 12/24 -Tx to [facility] for cardiology eval of bradycardia, new O2 requirement >mild edema on CXR 12/25 - A fib w/ RVR converted after Amio gtt started 12/26 - Antibiotics started for worsening hypotension  LINES/TUBES: R IJ CVL 12/25 >> PIV  Consults - Cardiology  Discharge Exam: BP (!) 109/56 (BP Location: Right Arm)   Pulse 82   Temp 99.6 F (37.6 C) (Axillary)   Resp (!) 28   Ht 5\' 10"  (1.778 m)   Wt 102.5 kg (225 lb 15.5 oz)   LMP 05/17/2016   SpO2 92%   BMI 32.42 kg/m   General:  Awake. No family at bedside. No distress. Neuro:  A&O x4. No meningismus. Grossly nonfocal.  HEENT:  Moist membranes. No scleral icterus or injection.  Cardiovascular:  Regular rate. No edema. No JVD. Pulmonary:  Clear with auscultation. Normal work of breathing on nasal cannula.  Abdomen:  Soft. Nontender. Normal bowel sounds.  Skin:  Warm & dry. No rash on exposed skin.   Labs at discharge Lab Results  Component Value Date   CREATININE 0.99 08/21/2016   BUN 9 08/21/2016   NA 136 08/21/2016   K 3.6 08/21/2016   CL 102 08/21/2016   CO2 25 08/21/2016   Lab Results  Component Value Date   WBC 19.9 (H) 08/21/2016   HGB 13.0 08/21/2016   HCT 37.5 08/21/2016   MCV 83.1 08/21/2016   PLT 410 (H) 08/21/2016   Lab Results  Component Value Date   ALT 16 08/21/2016   AST 25 08/21/2016   ALKPHOS 63 08/21/2016   BILITOT 0.5 08/21/2016   Lab Results  Component Value Date   INR 1.01 09/02/2012    Current radiology studies Dg Chest Port 1 View  Result Date: 08/21/2016 CLINICAL DATA:  Shortness of breath, mitral valve prolapse EXAM: PORTABLE CHEST 1 VIEW COMPARISON:  08/20/2016 FINDINGS: There is bilateral diffuse interstitial thickening. Small bilateral pleural effusions. No pneumothorax. Stable  cardiomegaly. Right jugular central venous catheter with the tip projecting over the SVC. The osseous structures are unremarkable. IMPRESSION: Mild CHF. Electronically Signed   By: Kathreen Devoid   On: 08/21/2016 17:10   Dg Chest Port 1 View  Result Date: 08/20/2016 CLINICAL DATA:  Central line placement EXAM: PORTABLE CHEST 1 VIEW COMPARISON:  608 hours on the same day FINDINGS: Portable semi upright view of the chest demonstrates a new right IJ central line catheter with tip at the cavoatrial junction. Heart is top-normal in size. Interstitial edema persists. Probable small left effusion. External defibrillator and cardiac monitoring devices are noted. IMPRESSION: No pneumothorax status post right IJ central line catheter. Tip noted at the cavoatrial junction. Persistent mild interstitial edema and small left effusion. Electronically Signed   By: Ashley Royalty M.D.   On: 08/20/2016 23:44   Dg Chest Port 1 View  Result Date: 08/20/2016 CLINICAL DATA:  Acute respiratory failure with hypoxia. EXAM: PORTABLE CHEST 1 VIEW COMPARISON:  1 day prior FINDINGS: Numerous leads and wires project over the chest. Patient rotated to the left. Cardiomegaly accentuated by AP portable technique. Small left pleural effusion is new or increased. No pneumothorax. Moderate interstitial edema, increased and accentuated by diminished lung volumes. Left  base airspace disease. IMPRESSION: Worsening aeration with increased interstitial edema and decreased ventilation. New or increased left pleural effusion with adjacent airspace disease. Electronically Signed   By: Abigail Miyamoto M.D.   On: 08/20/2016 08:27   Dg Abd Portable 1v  Result Date: 08/20/2016 CLINICAL DATA:  Nausea and vomiting. EXAM: PORTABLE ABDOMEN - 1 VIEW COMPARISON:  02/01/2016 and chest x-ray earlier today. MRI pelvis 01/04/2015. FINDINGS: Bowel gas pattern is nonobstructive. No abdominal mass or mass effect. No abnormal calcifications. Couple left-sided pelvic  phleboliths. Known avascular necrosis of the left femoral head with possible subchondral fracture. Small amount left pleural fluid. IMPRESSION: Nonobstructive bowel gas pattern. Known avascular necrosis of the left femoral head with possible subchondral fracture. Small left pleural effusion. Electronically Signed   By: Marin Olp M.D.   On: 08/20/2016 10:41    Disposition:  01-Home or Self Care   Allergies as of 08/21/2016      Reactions   Bee Venom Anaphylaxis   Benadryl [diphenhydramine Hcl] Nausea Only, Swelling   "makes my throat close up" "makes my throat close up"   Codeine Itching   Other reaction(s): ITCHING   Diphenhydramine Nausea Only   "makes my throat close up" "makes my throat close up"   Morphine And Related Other (See Comments)   Unknown reaction- pt gets sick, dizzy, and confused Intolerance, not an allergy, received this in the emergency department recently without a problem.      Medication List    STOP taking these medications   ALPRAZolam 1 MG tablet Commonly known as:  XANAX   diclofenac 75 MG EC tablet Commonly known as:  VOLTAREN   EPINEPHrine 0.3 mg/0.3 mL Soaj injection Commonly known as:  EPI-PEN   ferrous sulfate 325 (65 FE) MG EC tablet   ondansetron 8 MG tablet Commonly known as:  ZOFRAN   oxyCODONE 5 MG immediate release tablet Commonly known as:  ROXICODONE   pantoprazole 40 MG tablet Commonly known as:  PROTONIX   predniSONE 5 MG tablet Commonly known as:  DELTASONE   sertraline 100 MG tablet Commonly known as:  ZOLOFT   Sphygmomanometer Misc     TAKE these medications   acetaminophen 325 MG tablet Commonly known as:  TYLENOL Take 2 tablets (650 mg total) by mouth every 6 (six) hours as needed for mild pain (or Fever >/= 101).   albuterol (2.5 MG/3ML) 0.083% nebulizer solution Commonly known as:  PROVENTIL Take 3 mLs (2.5 mg total) by nebulization every 2 (two) hours as needed for wheezing or shortness of breath.     amiodarone 360-4.14 MG/200ML-% Soln Commonly known as:  NEXTERONE PREMIX Inject 16.67 mL/hr into the vein continuous.   budesonide 0.5 MG/2ML nebulizer solution Commonly known as:  PULMICORT Take 2 mLs (0.5 mg total) by nebulization daily as needed (for shortness of breath).   famotidine 20 MG tablet Commonly known as:  PEPCID Take 1 tablet (20 mg total) by mouth 2 (two) times daily.   famotidine 20-0.9 MG/50ML-% Commonly known as:  PEPCID Inject 50 mLs (20 mg total) into the vein every 12 (twelve) hours.   fentaNYL 100 MCG/2ML injection Commonly known as:  SUBLIMAZE Inject 0.5-1 mLs (25-50 mcg total) into the vein every hour as needed (Pain or Discomfort).   heparin 100-0.45 UNIT/ML-% infusion Inject 1,300 Units/hr into the vein continuous.   hydrocortisone sodium succinate 100 MG Solr injection Commonly known as:  SOLU-CORTEF Inject 1 mL (50 mg total) into the vein every 6 (six) hours.  insulin aspart 100 UNIT/ML injection Commonly known as:  novoLOG Inject 0-9 Units into the skin every 4 (four) hours.   midazolam 2 MG/2ML Soln injection Commonly known as:  VERSED Inject 1-4 mLs (1-4 mg total) into the vein every hour as needed for agitation or sedation.   norepinephrine 16 mg in dextrose 5 % 250 mL Inject 2-60 mcg/min into the vein continuous.   piperacillin-tazobactam 3.375 GM/50ML IVPB Commonly known as:  ZOSYN Inject 50 mLs (3.375 g total) into the vein every 8 (eight) hours.   sulfamethoxazole-trimethoprim 455 mg in dextrose 5 % 500 mL Inject 455 mg into the vein every 6 (six) hours. Start taking on:  08/22/2016   vancomycin 1-5 GM/200ML-% Soln Commonly known as:  VANCOCIN Inject 200 mLs (1,000 mg total) into the vein every 12 (twelve) hours.   vasopressin 40 Units in sodium chloride 0.9 % 250 mL Inject 0.04 Units/min into the vein continuous.       Discharged Condition: serious  Dispo: [facility] ICU  Greater than 35 minutes of time have  been dedicated to discharge assessment, planning and discharge instructions.   Signed: Georgann Housekeeper, AGACNP-BC [place] [facility] Pager [phone] or [phone]  08/21/2016 7:11 PM

## 2016-08-21 NOTE — Progress Notes (Signed)
Notified NP Hoffman of pt's increase  WOB with visible belly breathing and RR high 30's and expiratory wheezing. Prn albuterol given. MD and NP on way to bedside. Family at bedside with many questions and awaiting on MD for answers. Education given on pt's current status. Emotional support given to family. Will continue to monitor closely.

## 2016-08-21 NOTE — Progress Notes (Signed)
[place] Progress Note Patient Name: ALLEEN RAUM DOB: 1961-03-23 MRN: [id number]   Date of Service  08/21/2016  HPI/Events of Note  K=2.5 mg=1.3  eICU Interventions  Replace lytes     Intervention Category Major Interventions: Electrolyte abnormality - evaluation and management  Flora Lipps 08/21/2016, 4:45 AM

## 2016-08-21 NOTE — Progress Notes (Signed)
PULMONARY / CRITICAL CARE MEDICINE   Name: Kathleen [name] MRN: [id number] DOB: 28-May-1961    ADMISSION DATE:  08/17/2016 CONSULTATION DATE:  08/19/2016  REFERRING MD:  Candiss Norse, M.D. / [facility]  CHIEF COMPLAINT:  Shock  HISTORY OF PRESENT ILLNESS:  55 y.o. F with PMH of anemia, fibromyalgia, GERD, mitral valve prolapse, sarcoidosis and pulmonary fibrosis on chronic prednisone who was admitted to [facility] on 12/22 for gastroenteritis (nausea / vomiting and poor PO intake) which led to mild adrenal crisis as she could not take PO prednisone.  She was stabilized with IV fluids and steroids.  C-Diff was ruled out.  She was found to have bradycardia and requested transfer to [facility] for evaluation by Cardiology.  She was also noted to have a new O2 requirement of 2L.   CXR showed mild edema superimposed on IPF.  She was treated with IV lasix with improvement in respiratory status.  The patient was also treated with dopamine for bradycardia.    SUBJECTIVE: Patient had worsening hypotension overnight and vasopressin was started. Developed A fib w/ RVR on 12/25 & converted to NSR after starting IV amiodarone. Patient denies any difficulty breathing or cough. No chest pain or pressure.  REVIEW OF SYSTEMS:  No abdominal pain or nausea. No fever or chills.   VITAL SIGNS: BP (!) 86/45   Pulse 87   Temp 99.1 F (37.3 C) (Oral)   Resp (!) 24   Ht 5\' 10"  (1.778 m)   Wt 225 lb 15.5 oz (102.5 kg)   LMP 05/17/2016   SpO2 93%   BMI 32.42 kg/m   HEMODYNAMICS:    VENTILATOR SETTINGS:    INTAKE / OUTPUT: I/O last 3 completed shifts: In: 3058 [P.O.:120; I.V.:2308; IV Piggyback:630] Out: [id number] [Urine:4925]  PHYSICAL EXAMINATION: General:  Awake. No family at bedside. No distress. Neuro:  A&O x4. No meningismus. Grossly nonfocal.  HEENT:  Moist membranes. No scleral icterus or injection.  Cardiovascular:  Regular rate. No edema. No JVD. Pulmonary:  Clear with auscultation. Normal work of  breathing on nasal cannula.  Abdomen:  Soft. Nontender. Normal bowel sounds.  Skin:  Warm & dry. No rash on exposed skin.   LABS:  BMET  Recent Labs Lab 08/19/16 0545 08/20/16 0314 08/21/16 0305  NA 139 142 132*  K 3.7 3.2* 2.5*  CL 115* 100* 94*  CO2 20* 31 26  BUN 5* 5* 10  CREATININE 0.66 0.79 1.24*  GLUCOSE 104* 144* 218*    Electrolytes  Recent Labs Lab 08/19/16 0545 08/20/16 0314 08/21/16 0305  CALCIUM 8.4* 9.5 8.6*  MG 1.6* 1.8 1.3*    CBC  Recent Labs Lab 08/17/16 1055 08/20/16 0314 08/21/16 0305  WBC 5.0 13.8* 19.9*  HGB 11.7* 14.8 13.0  HCT 35.2* 41.7 37.5  PLT 305 337 410*    Coag's No results for input(s): APTT, INR in the last 168 hours.  Sepsis Markers  Recent Labs Lab 08/17/16 1743  LATICACIDVEN 0.8    ABG  Recent Labs Lab 08/19/16 1513  PHART 7.396  PCO2ART 39.0  PO2ART 82.9*    Liver Enzymes  Recent Labs Lab 08/17/16 1055 08/18/16 0900 08/21/16 0305  AST 13* 24 25  ALT 14 14 16   ALKPHOS 54 48 63  BILITOT 0.4 0.3 0.5  ALBUMIN 3.3* 3.1* 3.6    Cardiac Enzymes  Recent Labs Lab 08/20/16 1518 08/20/16 2045 08/21/16 0305  TROPONINI 0.10* 0.47* 1.66*    Glucose No results for input(s): GLUCAP in  the last 168 hours.  Imaging Dg Chest Port 1 View  Result Date: 08/20/2016 CLINICAL DATA:  Central line placement EXAM: PORTABLE CHEST 1 VIEW COMPARISON:  608 hours on the same day FINDINGS: Portable semi upright view of the chest demonstrates a new right IJ central line catheter with tip at the cavoatrial junction. Heart is top-normal in size. Interstitial edema persists. Probable small left effusion. External defibrillator and cardiac monitoring devices are noted. IMPRESSION: No pneumothorax status post right IJ central line catheter. Tip noted at the cavoatrial junction. Persistent mild interstitial edema and small left effusion. Electronically Signed   By: Ashley Royalty M.D.   On: 08/20/2016 23:44   Dg Abd Portable  1v  Result Date: 08/20/2016 CLINICAL DATA:  Nausea and vomiting. EXAM: PORTABLE ABDOMEN - 1 VIEW COMPARISON:  02/01/2016 and chest x-ray earlier today. MRI pelvis 01/04/2015. FINDINGS: Bowel gas pattern is nonobstructive. No abdominal mass or mass effect. No abnormal calcifications. Couple left-sided pelvic phleboliths. Known avascular necrosis of the left femoral head with possible subchondral fracture. Small amount left pleural fluid. IMPRESSION: Nonobstructive bowel gas pattern. Known avascular necrosis of the left femoral head with possible subchondral fracture. Small left pleural effusion. Electronically Signed   By: Marin Olp M.D.   On: 08/20/2016 10:41     STUDIES:  TTE 12/24: LVEF 50-55%, normal wall motion, mild diffuse hypokinesis, grade 2 diastolic dysfunction, PA peak 54  MICROBIOLOGY: MRSA PCR 12/22:  Negative Blood Ctx x2 12/26 >> U/A 12/26 >> Urine Ctx 12/26 >>  ANTIBIOTICS: Vancomycin 12/26 >> Zosyn 12/26 >>  SIGNIFICANT EVENTS: 12/22 - Admit with viral gastroenteritis, adrenal insufficiency related to poor intake / inability to take prednisone 12/23 - Improvement with IVF's / IV steroids 12/24 - Tx to [facility] for cardiology eval of bradycardia, new O2 requirement > mild edema on CXR 12/25 - A fib w/ RVR converted after Amio gtt started 12/26 - Antibiotics started for worsening hypotension  LINES/TUBES: R IJ CVL 12/25 >> PIV  ASSESSMENT / PLAN:  PULMONARY A: Acute Hypoxic Respiratory Failure - Mild. Pulmonary Hypertension - Seen on TTE. Pulmonary Sarcoidosis w/ ILD  P:   Weaning FiO2 for Sat >92% Continuous Pulse Oximetry Holding on diuresis given acute renal failure Stress dose Steroids  Pulmicort Neb BID  CARDIOVASCULAR A:  Shock - Septic vs Cardiogenic Atrial Fibrillation w/ RVR - New onset 12/25 & converted after starting Amiodarone drip. H/O Mitral Valve Prolapse  P:  Continuous Telemetry Monitoring Vitals per unit protocol Levophed &  Vasopressin to maintain MAP >65 Cardiology Following & appreciate assistance Amiodarone gtt Heparin gtt per pharmacy protocol  RENAL A:   Hyponatremia - Mild. Hypokalemia - Replaced. Hypomagnesemia - Replaced. Acute Renal Failure - Likely secondary to shock & diuresis. Worsening.   P:   Monitoring UOP Trending renal function & electrolytes daily Replacing electrolytes as indicated  Magnesium Sulfate 4gm IV KCl 18mEq IV Holding on further diuresis  GASTROINTESTINAL A:   H/O GERD  P:   Clear liquid diet Pepcid IV q12hr  HEMATOLOGIC A:   H/O Anemia   P:  Trending cell counts daily w/ CBC Ferrous Sulfate 325mg  daily Heparin gtt per pharmacy protocol  INFECTIOUS A:   Possible Sepsis  P:   Blood Cultures, Urine Culture & U/A now Empiric Vancomycin & Zosyn Trending Procalcitonin per algorithm   ENDOCRINE A:   Chronic Prednisone Use - Usual dose 5mg  daily. Hyperglycemia - No h/o DM.  P:   Checking Hgb A1c SSI per Sensitive Algorithm  Accu-Checks q4hr Solu-Cortef 50mg  IV q6hr  NEUROLOGIC A:   H/O Fibromyalgia H/O Bipolar Disorder  P:   Monitor closely in ICU Zoloft PO dialy   FAMILY  - Updates:  Patient updated 12/26 at bedside.   - Inter-disciplinary family meet or Palliative Care meeting due by:  12/31  TODAY'S SUMMARY:  55 y.o. female with history of ILD and Sarcoidosis on chronic Prednisone therapy. Patient with worsening shock now requiring vasopressin & levophed infusions. Resuming stress dose steroids with Solu-Cortef. Starting broad spectrum antibiotics and obtaining cultures.   I have spent a total of 34 minutes of critical care time today caring for the patient and reviewing the patient's electronic medical record.   Sonia Baller Ashok Cordia, M.D. [facility] Pulmonary [facility] Critical [facility] Pager:  [phone] After 3pm or if no response, call [phone] 08/21/2016, 8:35 AM

## 2016-08-21 NOTE — Progress Notes (Signed)
Daughter Caryl Pina called and given update. Daughter plans on being at bedside later today. Will continue to monitor.

## 2016-08-21 NOTE — Progress Notes (Signed)
CRITICAL VALUE ALERT  Critical value received:  K+ 2.5  Date of notification:  08/21/16  Time of notification:  0410  Critical value read back:Yes.    Nurse who received alert:  Fredonia Highland, RN   MD notified (1st page):  E-Link called and notified   Time of first page: [phone]

## 2016-08-21 NOTE — Progress Notes (Signed)
Blood cultures obtained prior to patient receiving vancomycin dose. Will continue to monitor.

## 2016-08-21 NOTE — Plan of Care (Signed)
  Interdisciplinary Goals of Care Family Meeting   Date carried out:: 08/21/2016  Location of the meeting: Bedside  Member's involved: Physician, Bedside Registered Nurse, Family Member or next of kin and Other: Dr. Farley Ly [facility]) via phone.  Durable Power of Tour manager: Patient/Herself    Discussion: We discussed goals of care for Kathleen [name] .  Updated patient's family & specifically her husband now at bedside. Also spoke with her primary pulmonologist Dr. Farley Ly via patient's daughter's cell phone. I explained her current clinical state and my concerns regarding transporting the patient without endotracheally intubating her first. Family and patient agreeing to intubation knowing the potential risks to the patient.   Code status: Full Code  Disposition: Continue current acute care  Time spent for the meeting: 17 minutes  Kathleen [name] 08/21/2016, 6:28 PM

## 2016-08-21 NOTE — Progress Notes (Signed)
MEDICATION CONSULT NOTE - Follow Up Consult  Pharmacy Consult for Heparin + Vancomycin + Zosyn Indication: chest pain/ACS/Atrial fibrillation + ?sepsis  Patient Measurements: Height: 5\' 10"  (177.8 cm) Weight: 225 lb 15.5 oz (102.5 kg) IBW/kg (Calculated) : 68.5 Heparin Dosing Weight: 90 kg  Labs:  Recent Labs  08/19/16 0545 08/20/16 0314 08/20/16 1518 08/20/16 2045 08/21/16 0054 08/21/16 0305  HGB  --  14.8  --   --   --  13.0  HCT  --  41.7  --   --   --  37.5  PLT  --  337  --   --   --  410*  HEPARINUNFRC  --   --   --   --  <0.10*  --   CREATININE 0.66 0.79  --   --   --  1.24*  TROPONINI <0.03  --  0.10* 0.47*  --  1.66*    Estimated Creatinine Clearance: 68 mL/min (by C-G formula based on SCr of 1.24 mg/dL (H)).  Antimicrobials this admission: Vancomycin 12/26 >> Zosyn 12/26 >>  Dose adjustments this admission: NA  Microbiology results: MRSA PCR 12/22: Negative Blood Ctx x2 12/26 >> U/A 12/26 >> Urine Ctx 12/26 >>  Assessment: 55 y.o. female with elevated cardiac makers and EKG changes w/consult for heparin for r/o ACS. On 12/25 patient also experienced atrial fibrillation. CBC stable, no issues reported. Heparin level now therapeutic (0.46)   ?cardiogenic vs septic shock with elevated WBC. New consults for vancomycin and zosyn.  Goal of Therapy:  Heparin level 0.3-0.7 units/ml Monitor platelets by anticoagulation protocol: Yes  Vancomycin trough 15-20   Plan:  Heparin at 1300 units/hr Daily heparin level/CBC  Vancomycin 2g IV load*1, then 1000mg  IV every 12 hours Zosyn 3.375g IV every 8 hours Follow renal function closely for dose adjustments  Melburn Popper, PharmD Clinical Pharmacy Resident Pager: [phone] 08/21/16 10:15 AM

## 2016-08-21 NOTE — Progress Notes (Signed)
Pharmacy Antibiotic Note  Kathleen [name] is a 55 y.o. female admitted on 08/17/2016 with nausea, vomiting and diarrhea.  Pharmacy has been consulted for vancomycin and Zosyn dosing for sepsis.  Now to add Septra for PCP PNA.  Patient's renal function is improving.  Potassium improved to a low normal level.  She remains afebrile and her WBC is elevated at 19.9.   Plan: - Septra 455mg  IV Q6H (~5 mg/kg of TMP, using admit weight of 90.7kg) - Monitor renal fxn, K+   Height: 5\' 10"  (177.8 cm) Weight: 225 lb 15.5 oz (102.5 kg) IBW/kg (Calculated) : 68.5  Temp (24hrs), Avg:98.7 F (37.1 C), Min:97.6 F (36.4 C), Max:99.6 F (37.6 C)   Recent Labs Lab 08/16/16 1316 08/17/16 1055 08/17/16 1743  08/18/16 0900 08/19/16 0545 08/20/16 0314 08/21/16 0305 08/21/16 1620  WBC 6.5 5.0  --   --   --   --  13.8* 19.9*  --   CREATININE 0.77 0.84  --   < > 0.71 0.66 0.79 1.24* 0.99  LATICACIDVEN  --   --  0.8  --   --   --   --   --   --   < > = values in this interval not displayed.  Estimated Creatinine Clearance: 85.2 mL/min (by C-G formula based on SCr of 0.99 mg/dL).    Allergies  Allergen Reactions  . Bee Venom Anaphylaxis  . Benadryl [Diphenhydramine Hcl] Nausea Only and Swelling    "makes my throat close up" "makes my throat close up"  . Codeine Itching    Other reaction(s): ITCHING  . Diphenhydramine Nausea Only    "makes my throat close up" "makes my throat close up"  . Morphine And Related Other (See Comments)    Unknown reaction- pt gets sick, dizzy, and confused Intolerance, not an allergy, received this in the emergency department recently without a problem.    Antimicrobials this admission: Vanc 12/26 >> Zosyn 12/26 >> Septra 12/26 >>  Dose adjustments this admission: N/A  Microbiology results: MRSA PCR 12/22:  Negative Blood Ctx x2 12/26 >> U/A 12/26 >> Urine Ctx 12/26 >>  Tory Mckissack D. Mina Marble, PharmD, BCPS Pager:  [phone] 08/21/2016, 6:06 PM

## 2016-08-21 NOTE — Procedures (Signed)
Intubation Procedure Note Kathleen [name] [id number] [birth year]  Procedure: Intubation Indications: Respiratory insufficiency  Procedure Details Consent: Risks of procedure as well as the alternatives and risks of each were explained to the (patient/caregiver).  Consent for procedure obtained. Time Out: Verified patient identification, verified procedure, site/side was marked, verified correct patient position, special equipment/implants available, medications/allergies/relevent history reviewed, required imaging and test results available.  Performed  Maximum sterile technique was used including gloves, hand hygiene and mask.   Medications Etomidate 30mg  Rocuronium 60mg   MAC 4 glidescope was used for intubation.  Grade 1 Airway view  8.0 ETT placed to 23cm.   Evaluation Hemodynamic Status: BP stable throughout; O2 sats: stable throughout Patient's Current Condition: stable Complications: No apparent complications Patient did tolerate procedure well. Chest X-ray ordered to verify placement.  CXR: pending.   Kathleen [name], AGACNP-BC [facility] Pulmonology/Critical Care Pager [phone] or [phone]  08/21/2016 7:01 PM

## 2016-08-21 NOTE — Plan of Care (Signed)
  Interdisciplinary Goals of Care Family Meeting   Date carried out:: 08/21/2016  Location of the meeting: Bedside  Member's involved: Physician, Bedside Registered Nurse, Family Member or next of kin and Other: Cardiologist  Durable Power of Attorney or acting medical decision maker: Patient/Herself    Discussion: We discussed goals of care for Kathleen [name] .  I had a lengthy discussion with the patient, her daughter, her mother-in-law, sister-in-law, and other care team members. I explained that the patient's respiratory status has been progressively worsening since the early morning when I rounded on the patient. She has stabilized without escalating doses of Vasopressin or Levophed. The patient has responded to fluid boluses but she has now some audible wheezing and increasing abdominal breathing. I pointed out to the family that it would not be safe to transport her without electively intubating her prior to transport and also that I feel her respiratory status will continue to worsen and necessitate her intubation before long. Obviously we would want to do the intubation in a controlled setting to minimize any potential complications to the patient. Cardiology concurred pointing out that if she were to become more hypoxic it could again precipitate a bradycardia and potentially even a cardiac arrest. The patient and family inquired about transferring her to [facility] in [place], [place], as her primary pulmonologist is at that facility. I think this is certainly reasonable given the severity of her illness. We also addressed the fact that her chances of a successful extubation are low and there is a definite risk of continued ventilator requirement even after she recovers from her acute illness. The patient wishes to discuss this with her family first before deciding on whether or not to intubate. I have placed a call to the Medical ICU Intensivist at [facility] to discuss transferring the patient for  ongoing care. We are continuing to hold her Imuran and continuing stress dose steroids. I have also ordered Bactrim for treatment of PCP with worsening bilateral alveolar opacities. I have also added a serum LDH and Beta-D Glucan to her infectious workup.   Code status: Full Code  Disposition: Continue current acute care  Time spent for the meeting: 21 minutes  Tera Partridge 08/21/2016, 5:45 PM

## 2016-08-21 NOTE — Progress Notes (Signed)
[facility] ADULT ICU REPLACEMENT PROTOCOL FOR AM LAB REPLACEMENT ONLY  The patient does not apply for the [facility] Adult ICU Electrolyte Replacment Protocol based on the criteria listed below:     Is urine output >/= 0.5 ml/kg/hr for the last 6 hours? No. Patient's UOP is unknown   Abnormal electrolyte(s):  K2.5, Mg 1.3  If a panic level lab has been reported, has the CCM MD in charge been notified? Yes.  .   Physician:  Jasper Loser, MD  Vear Clock 08/21/2016 4:25 AM

## 2016-08-22 DIAGNOSIS — D869 Sarcoidosis, unspecified: Secondary | ICD-10-CM | POA: Diagnosis not present

## 2016-08-22 DIAGNOSIS — N179 Acute kidney failure, unspecified: Secondary | ICD-10-CM | POA: Diagnosis not present

## 2016-08-22 DIAGNOSIS — R570 Cardiogenic shock: Secondary | ICD-10-CM | POA: Diagnosis present

## 2016-08-22 DIAGNOSIS — E271 Primary adrenocortical insufficiency: Secondary | ICD-10-CM | POA: Insufficient documentation

## 2016-08-22 DIAGNOSIS — A419 Sepsis, unspecified organism: Secondary | ICD-10-CM | POA: Insufficient documentation

## 2016-08-22 DIAGNOSIS — I4891 Unspecified atrial fibrillation: Secondary | ICD-10-CM | POA: Diagnosis not present

## 2016-08-22 DIAGNOSIS — I248 Other forms of acute ischemic heart disease: Secondary | ICD-10-CM | POA: Diagnosis present

## 2016-08-22 DIAGNOSIS — R7989 Other specified abnormal findings of blood chemistry: Secondary | ICD-10-CM | POA: Insufficient documentation

## 2016-08-22 DIAGNOSIS — R000 Tachycardia, unspecified: Secondary | ICD-10-CM | POA: Diagnosis not present

## 2016-08-22 DIAGNOSIS — E876 Hypokalemia: Secondary | ICD-10-CM | POA: Diagnosis present

## 2016-08-22 DIAGNOSIS — J90 Pleural effusion, not elsewhere classified: Secondary | ICD-10-CM | POA: Diagnosis not present

## 2016-08-22 DIAGNOSIS — R578 Other shock: Secondary | ICD-10-CM | POA: Diagnosis not present

## 2016-08-22 DIAGNOSIS — J9601 Acute respiratory failure with hypoxia: Secondary | ICD-10-CM | POA: Insufficient documentation

## 2016-08-22 DIAGNOSIS — Z7952 Long term (current) use of systemic steroids: Secondary | ICD-10-CM | POA: Diagnosis not present

## 2016-08-22 DIAGNOSIS — I272 Pulmonary hypertension, unspecified: Secondary | ICD-10-CM | POA: Diagnosis not present

## 2016-08-22 DIAGNOSIS — J8410 Pulmonary fibrosis, unspecified: Secondary | ICD-10-CM | POA: Diagnosis not present

## 2016-08-22 DIAGNOSIS — M87852 Other osteonecrosis, left femur: Secondary | ICD-10-CM | POA: Diagnosis present

## 2016-08-22 DIAGNOSIS — I4581 Long QT syndrome: Secondary | ICD-10-CM | POA: Diagnosis present

## 2016-08-22 DIAGNOSIS — J9621 Acute and chronic respiratory failure with hypoxia: Secondary | ICD-10-CM | POA: Diagnosis not present

## 2016-08-22 DIAGNOSIS — J189 Pneumonia, unspecified organism: Secondary | ICD-10-CM | POA: Diagnosis not present

## 2016-08-22 DIAGNOSIS — D649 Anemia, unspecified: Secondary | ICD-10-CM | POA: Diagnosis present

## 2016-08-22 DIAGNOSIS — R6521 Severe sepsis with septic shock: Secondary | ICD-10-CM | POA: Diagnosis present

## 2016-08-22 DIAGNOSIS — M797 Fibromyalgia: Secondary | ICD-10-CM | POA: Diagnosis present

## 2016-08-22 DIAGNOSIS — R778 Other specified abnormalities of plasma proteins: Secondary | ICD-10-CM | POA: Insufficient documentation

## 2016-08-22 DIAGNOSIS — I959 Hypotension, unspecified: Secondary | ICD-10-CM | POA: Diagnosis not present

## 2016-08-22 DIAGNOSIS — K529 Noninfective gastroenteritis and colitis, unspecified: Secondary | ICD-10-CM | POA: Insufficient documentation

## 2016-08-22 DIAGNOSIS — E871 Hypo-osmolality and hyponatremia: Secondary | ICD-10-CM | POA: Diagnosis present

## 2016-08-22 DIAGNOSIS — R918 Other nonspecific abnormal finding of lung field: Secondary | ICD-10-CM | POA: Diagnosis not present

## 2016-08-22 DIAGNOSIS — F419 Anxiety disorder, unspecified: Secondary | ICD-10-CM | POA: Diagnosis present

## 2016-08-22 DIAGNOSIS — R001 Bradycardia, unspecified: Secondary | ICD-10-CM | POA: Diagnosis not present

## 2016-08-22 DIAGNOSIS — R0902 Hypoxemia: Secondary | ICD-10-CM | POA: Diagnosis not present

## 2016-08-22 DIAGNOSIS — D860 Sarcoidosis of lung: Secondary | ICD-10-CM | POA: Diagnosis present

## 2016-08-22 MED FILL — Ondansetron HCl Tab 4 MG: ORAL | Qty: 4 | Status: AC

## 2016-08-23 LAB — LEGIONELLA PNEUMOPHILA SEROGP 1 UR AG: L. pneumophila Serogp 1 Ur Ag: NEGATIVE

## 2016-08-23 LAB — GLUCOSE, CAPILLARY: Glucose-Capillary: 574 mg/dL (ref 65–99)

## 2016-08-24 LAB — URINE CULTURE: Culture: >=100000 — AB

## 2016-08-26 LAB — CULTURE, BLOOD (ROUTINE X 2)
Culture: NO GROWTH
Culture: NO GROWTH

## 2016-08-27 DIAGNOSIS — A419 Sepsis, unspecified organism: Secondary | ICD-10-CM | POA: Diagnosis not present

## 2016-08-27 DIAGNOSIS — J189 Pneumonia, unspecified organism: Secondary | ICD-10-CM | POA: Diagnosis not present

## 2016-08-27 DIAGNOSIS — N179 Acute kidney failure, unspecified: Secondary | ICD-10-CM | POA: Diagnosis not present

## 2016-08-27 DIAGNOSIS — J9621 Acute and chronic respiratory failure with hypoxia: Secondary | ICD-10-CM | POA: Diagnosis not present

## 2016-08-27 DIAGNOSIS — I248 Other forms of acute ischemic heart disease: Secondary | ICD-10-CM | POA: Diagnosis not present

## 2016-08-27 DIAGNOSIS — E271 Primary adrenocortical insufficiency: Secondary | ICD-10-CM | POA: Diagnosis not present

## 2016-08-27 DIAGNOSIS — R570 Cardiogenic shock: Secondary | ICD-10-CM | POA: Diagnosis not present

## 2016-08-27 DIAGNOSIS — R6521 Severe sepsis with septic shock: Secondary | ICD-10-CM | POA: Diagnosis not present

## 2016-08-27 DIAGNOSIS — J90 Pleural effusion, not elsewhere classified: Secondary | ICD-10-CM | POA: Diagnosis not present

## 2016-08-29 MED FILL — MIDODRINE HCL 5 MG TABLET: 5 | 30 days supply | Qty: 90 | Fill #0

## 2016-08-29 MED FILL — predniSONE 5 MG TABS: 5 | 10 days supply | Qty: 10 | Fill #0

## 2016-08-30 LAB — MISC LABCORP TEST (SEND OUT): Labcorp test code: 284526

## 2016-08-31 DIAGNOSIS — E271 Primary adrenocortical insufficiency: Secondary | ICD-10-CM | POA: Diagnosis not present

## 2016-08-31 DIAGNOSIS — D860 Sarcoidosis of lung: Secondary | ICD-10-CM | POA: Diagnosis not present

## 2016-08-31 DIAGNOSIS — K529 Noninfective gastroenteritis and colitis, unspecified: Secondary | ICD-10-CM | POA: Diagnosis not present

## 2016-08-31 DIAGNOSIS — Z7952 Long term (current) use of systemic steroids: Secondary | ICD-10-CM | POA: Diagnosis not present

## 2016-08-31 DIAGNOSIS — Z8701 Personal history of pneumonia (recurrent): Secondary | ICD-10-CM | POA: Diagnosis not present

## 2016-08-31 DIAGNOSIS — D649 Anemia, unspecified: Secondary | ICD-10-CM | POA: Diagnosis not present

## 2016-08-31 DIAGNOSIS — J84112 Idiopathic pulmonary fibrosis: Secondary | ICD-10-CM | POA: Diagnosis not present

## 2016-08-31 DIAGNOSIS — I272 Pulmonary hypertension, unspecified: Secondary | ICD-10-CM | POA: Diagnosis not present

## 2016-08-31 MED FILL — SERTRALINE HCL 100 MG TAB: 100 | 30 days supply | Qty: 30 | Fill #0

## 2016-09-03 ENCOUNTER — Telehealth: Payer: Self-pay | Admitting: Family Medicine

## 2016-09-03 DIAGNOSIS — R5381 Other malaise: Secondary | ICD-10-CM

## 2016-09-03 NOTE — Telephone Encounter (Signed)
How would you like to proceed?  [facility] notes in chart.

## 2016-09-03 NOTE — Telephone Encounter (Signed)
[facility] is requesting home health orders for physical therapy, home health aide, and occupational therapy. Pt was discharged from [facility] last Thurs for pneumonia and respiratory issues.

## 2016-09-03 NOTE — Telephone Encounter (Signed)
[facility] - Dr. Nicki Reaper would like to see pt within the next 2 weeks for hospital follow up

## 2016-09-03 NOTE — Telephone Encounter (Signed)
#  1 patient may have orders for physical therapy home health aide occupational therapy #2 the patient should do a follow-up visit with [place] within the next 2 weeks if possible

## 2016-09-04 DIAGNOSIS — K529 Noninfective gastroenteritis and colitis, unspecified: Secondary | ICD-10-CM | POA: Diagnosis not present

## 2016-09-04 DIAGNOSIS — D860 Sarcoidosis of lung: Secondary | ICD-10-CM | POA: Diagnosis not present

## 2016-09-04 DIAGNOSIS — J84112 Idiopathic pulmonary fibrosis: Secondary | ICD-10-CM | POA: Diagnosis not present

## 2016-09-04 DIAGNOSIS — E271 Primary adrenocortical insufficiency: Secondary | ICD-10-CM | POA: Diagnosis not present

## 2016-09-04 DIAGNOSIS — Z8701 Personal history of pneumonia (recurrent): Secondary | ICD-10-CM | POA: Diagnosis not present

## 2016-09-04 DIAGNOSIS — I272 Pulmonary hypertension, unspecified: Secondary | ICD-10-CM | POA: Diagnosis not present

## 2016-09-05 ENCOUNTER — Telehealth: Payer: Self-pay | Admitting: Family Medicine

## 2016-09-05 DIAGNOSIS — K529 Noninfective gastroenteritis and colitis, unspecified: Secondary | ICD-10-CM | POA: Diagnosis not present

## 2016-09-05 DIAGNOSIS — D860 Sarcoidosis of lung: Secondary | ICD-10-CM | POA: Diagnosis not present

## 2016-09-05 DIAGNOSIS — Z8701 Personal history of pneumonia (recurrent): Secondary | ICD-10-CM | POA: Diagnosis not present

## 2016-09-05 DIAGNOSIS — E271 Primary adrenocortical insufficiency: Secondary | ICD-10-CM | POA: Diagnosis not present

## 2016-09-05 DIAGNOSIS — I272 Pulmonary hypertension, unspecified: Secondary | ICD-10-CM | POA: Diagnosis not present

## 2016-09-05 DIAGNOSIS — J84112 Idiopathic pulmonary fibrosis: Secondary | ICD-10-CM | POA: Diagnosis not present

## 2016-09-05 NOTE — Telephone Encounter (Signed)
Per chart, midodrine is 5 mg TID.  She states she is not sleeping.  She reports taking 0800, 1500, and at 2100.  I advised her to con't taking as prescribed until Dr. Nicki Reaper responds with recommendation.  I advised her she could take last dose earlier than 2100 to see if that helps.  I advised her Dr. Nicki Reaper was out of office today, asked if she would like me to send this message to Dr. Richardson Landry, she replied-no. I advised her I would call back as soon as Dr. Nicki Reaper gave recommendations. She voiced understanding and agreed with plan.

## 2016-09-05 NOTE — Telephone Encounter (Signed)
Patient states doctor at the hospital put her on new blood pressure medication while she was in there and its causing her not to sleep at night and wanting it adjusted so she can sleep at night  She gave me midodrine didn't know mg

## 2016-09-06 NOTE — Telephone Encounter (Signed)
I would recommend she reduce the medication to 5 mg twice daily bring all medicines with her when she comes for office visit next week

## 2016-09-06 NOTE — Telephone Encounter (Signed)
I advised patient of recommendation. She voiced understanding and agreed with plan.

## 2016-09-07 DIAGNOSIS — J84112 Idiopathic pulmonary fibrosis: Secondary | ICD-10-CM | POA: Diagnosis not present

## 2016-09-07 DIAGNOSIS — K529 Noninfective gastroenteritis and colitis, unspecified: Secondary | ICD-10-CM | POA: Diagnosis not present

## 2016-09-07 DIAGNOSIS — E271 Primary adrenocortical insufficiency: Secondary | ICD-10-CM | POA: Diagnosis not present

## 2016-09-07 DIAGNOSIS — D860 Sarcoidosis of lung: Secondary | ICD-10-CM | POA: Diagnosis not present

## 2016-09-07 DIAGNOSIS — I272 Pulmonary hypertension, unspecified: Secondary | ICD-10-CM | POA: Diagnosis not present

## 2016-09-07 DIAGNOSIS — Z8701 Personal history of pneumonia (recurrent): Secondary | ICD-10-CM | POA: Diagnosis not present

## 2016-09-10 DIAGNOSIS — Z8701 Personal history of pneumonia (recurrent): Secondary | ICD-10-CM | POA: Diagnosis not present

## 2016-09-10 DIAGNOSIS — D860 Sarcoidosis of lung: Secondary | ICD-10-CM | POA: Diagnosis not present

## 2016-09-10 DIAGNOSIS — J84112 Idiopathic pulmonary fibrosis: Secondary | ICD-10-CM | POA: Diagnosis not present

## 2016-09-10 DIAGNOSIS — K529 Noninfective gastroenteritis and colitis, unspecified: Secondary | ICD-10-CM | POA: Diagnosis not present

## 2016-09-10 DIAGNOSIS — E271 Primary adrenocortical insufficiency: Secondary | ICD-10-CM | POA: Diagnosis not present

## 2016-09-10 DIAGNOSIS — I272 Pulmonary hypertension, unspecified: Secondary | ICD-10-CM | POA: Diagnosis not present

## 2016-09-12 ENCOUNTER — Ambulatory Visit: Payer: 59 | Admitting: Family Medicine

## 2016-09-13 ENCOUNTER — Ambulatory Visit: Payer: 59 | Admitting: Family Medicine

## 2016-09-14 DIAGNOSIS — K529 Noninfective gastroenteritis and colitis, unspecified: Secondary | ICD-10-CM | POA: Diagnosis not present

## 2016-09-14 DIAGNOSIS — J84112 Idiopathic pulmonary fibrosis: Secondary | ICD-10-CM | POA: Diagnosis not present

## 2016-09-14 DIAGNOSIS — D860 Sarcoidosis of lung: Secondary | ICD-10-CM | POA: Diagnosis not present

## 2016-09-14 DIAGNOSIS — I272 Pulmonary hypertension, unspecified: Secondary | ICD-10-CM | POA: Diagnosis not present

## 2016-09-14 DIAGNOSIS — E271 Primary adrenocortical insufficiency: Secondary | ICD-10-CM | POA: Diagnosis not present

## 2016-09-14 DIAGNOSIS — Z8701 Personal history of pneumonia (recurrent): Secondary | ICD-10-CM | POA: Diagnosis not present

## 2016-09-14 MED FILL — predniSONE 5 MG TABS: 5 | 90 days supply | Qty: 90 | Fill #0

## 2016-09-17 DIAGNOSIS — J84112 Idiopathic pulmonary fibrosis: Secondary | ICD-10-CM | POA: Diagnosis not present

## 2016-09-17 DIAGNOSIS — I272 Pulmonary hypertension, unspecified: Secondary | ICD-10-CM | POA: Diagnosis not present

## 2016-09-17 DIAGNOSIS — K529 Noninfective gastroenteritis and colitis, unspecified: Secondary | ICD-10-CM | POA: Diagnosis not present

## 2016-09-17 DIAGNOSIS — E271 Primary adrenocortical insufficiency: Secondary | ICD-10-CM | POA: Diagnosis not present

## 2016-09-17 DIAGNOSIS — D860 Sarcoidosis of lung: Secondary | ICD-10-CM | POA: Diagnosis not present

## 2016-09-17 DIAGNOSIS — Z8701 Personal history of pneumonia (recurrent): Secondary | ICD-10-CM | POA: Diagnosis not present

## 2016-09-18 ENCOUNTER — Encounter: Payer: Self-pay | Admitting: Family Medicine

## 2016-09-18 ENCOUNTER — Ambulatory Visit (INDEPENDENT_AMBULATORY_CARE_PROVIDER_SITE_OTHER): Payer: 59 | Admitting: Family Medicine

## 2016-09-18 VITALS — BP 112/72 | Ht 70.0 in | Wt 203.6 lb

## 2016-09-18 DIAGNOSIS — E271 Primary adrenocortical insufficiency: Secondary | ICD-10-CM | POA: Diagnosis not present

## 2016-09-18 DIAGNOSIS — I951 Orthostatic hypotension: Secondary | ICD-10-CM

## 2016-09-18 DIAGNOSIS — I272 Pulmonary hypertension, unspecified: Secondary | ICD-10-CM | POA: Diagnosis not present

## 2016-09-18 DIAGNOSIS — D860 Sarcoidosis of lung: Secondary | ICD-10-CM | POA: Diagnosis not present

## 2016-09-18 DIAGNOSIS — D869 Sarcoidosis, unspecified: Secondary | ICD-10-CM

## 2016-09-18 DIAGNOSIS — K529 Noninfective gastroenteritis and colitis, unspecified: Secondary | ICD-10-CM | POA: Diagnosis not present

## 2016-09-18 DIAGNOSIS — I5189 Other ill-defined heart diseases: Secondary | ICD-10-CM

## 2016-09-18 DIAGNOSIS — J84112 Idiopathic pulmonary fibrosis: Secondary | ICD-10-CM | POA: Diagnosis not present

## 2016-09-18 DIAGNOSIS — I519 Heart disease, unspecified: Secondary | ICD-10-CM | POA: Diagnosis not present

## 2016-09-18 DIAGNOSIS — Z8701 Personal history of pneumonia (recurrent): Secondary | ICD-10-CM | POA: Diagnosis not present

## 2016-09-18 MED ORDER — HYDROCODONE-ACETAMINOPHEN 5-325 MG PO TABS
1.0000 | ORAL_TABLET | Freq: Two times a day (BID) | ORAL | 0 refills | Status: DC | PRN
Start: 2016-09-18 — End: 2016-11-01

## 2016-09-18 NOTE — Progress Notes (Signed)
   Subjective:    Patient ID: Kathleen [name], female    DOB: [birth year], 56 y.o.   MRN: [id number]  HPI Patient arrives for a hospital follow up. Patient in hospital-on ventilator and transferred to [facility]. Hospital notes from [place], [facility] [place] hospital was reviewed with the patient Patient's echo did show class II diastolic dysfunction Patient also was diagnosed with adrenal insufficiency and was treated with IV steroids and is currently on oral steroids She is been on steroids for months because of sarcoidosis. Patient relates she was told by her pulmonologist that the main problem she had was fluid overload from overaggressive rehydration of her gastroenteritis/dehydration. Review of Systems    currently denies shortness of breath she does relate fatigue tiredness denies fever chills denies vomiting diarrhea does relate left hip pain Objective:   Physical Exam Neck no masses lungs are clear no crackles heart is regular pulses normal extremities no edema skin warm dry blood pressure laying and sitting shows a relatively low blood pressure with some drop when she sits up       Assessment & Plan:  Extremely complex patient Discussed at length her recent problems Referral to cardiology because of diastolic dysfunction and her report that she had fluid overload in the hospital Referral to endocrinology because of adrenal insufficiency patient on chronic prednisone Follow-up a pulmonary specialist at [facility] for sarcoidosis Blood pressure stable on current medication continue healthy diet and taking medicine Patient to follow-up with [place] in approximately 6-8 weeks  Significant left hip pain she would like pain medication but does not one oxycodone we will try hydrocodone first patient understands importance of not overusing the medicine the risk of addiction and the risk of accidental overdose

## 2016-09-19 DIAGNOSIS — Z8701 Personal history of pneumonia (recurrent): Secondary | ICD-10-CM | POA: Diagnosis not present

## 2016-09-19 DIAGNOSIS — I272 Pulmonary hypertension, unspecified: Secondary | ICD-10-CM | POA: Diagnosis not present

## 2016-09-19 DIAGNOSIS — K529 Noninfective gastroenteritis and colitis, unspecified: Secondary | ICD-10-CM | POA: Diagnosis not present

## 2016-09-19 DIAGNOSIS — D860 Sarcoidosis of lung: Secondary | ICD-10-CM | POA: Diagnosis not present

## 2016-09-19 DIAGNOSIS — E271 Primary adrenocortical insufficiency: Secondary | ICD-10-CM | POA: Diagnosis not present

## 2016-09-19 DIAGNOSIS — J84112 Idiopathic pulmonary fibrosis: Secondary | ICD-10-CM | POA: Diagnosis not present

## 2016-09-20 ENCOUNTER — Telehealth: Payer: Self-pay | Admitting: Family Medicine

## 2016-09-20 NOTE — Telephone Encounter (Signed)
Patient was seen 1/23 and states still not sleeping is their something over the counter she can take to help her sleep.

## 2016-09-20 NOTE — Telephone Encounter (Signed)
Tried to call no answer (voicemail box not setup)

## 2016-09-20 NOTE — Telephone Encounter (Signed)
Melatonin 5 mg one qhs prn

## 2016-09-21 MED FILL — PANTOPRAZOLE SOD DR 40 MG T: 40 | 90 days supply | Qty: 90 | Fill #1

## 2016-09-21 NOTE — Telephone Encounter (Signed)
That will be fine but it is the same as generic Benadryl, just marketed a different way. Same as Tylenol PM. It would be cheaper to take Benadryl 25 mg at bedtime. Make sure no drowsiness the next am.

## 2016-09-21 NOTE — Telephone Encounter (Signed)
Patient states she has tried Melatonin and it is not helping with sleep.  She would like to know if she can take ZQuil or something similar.

## 2016-09-21 NOTE — Telephone Encounter (Signed)
Discussed with pt. Pt verbalized understanding.  °

## 2016-09-24 DIAGNOSIS — N6019 Diffuse cystic mastopathy of unspecified breast: Secondary | ICD-10-CM | POA: Diagnosis not present

## 2016-09-24 DIAGNOSIS — Z79899 Other long term (current) drug therapy: Secondary | ICD-10-CM | POA: Diagnosis not present

## 2016-09-24 DIAGNOSIS — R0602 Shortness of breath: Secondary | ICD-10-CM | POA: Diagnosis not present

## 2016-09-24 DIAGNOSIS — K219 Gastro-esophageal reflux disease without esophagitis: Secondary | ICD-10-CM | POA: Diagnosis not present

## 2016-09-24 DIAGNOSIS — F419 Anxiety disorder, unspecified: Secondary | ICD-10-CM | POA: Diagnosis not present

## 2016-09-24 DIAGNOSIS — R270 Ataxia, unspecified: Secondary | ICD-10-CM | POA: Diagnosis not present

## 2016-09-24 DIAGNOSIS — Z803 Family history of malignant neoplasm of breast: Secondary | ICD-10-CM | POA: Diagnosis not present

## 2016-09-24 DIAGNOSIS — G479 Sleep disorder, unspecified: Secondary | ICD-10-CM | POA: Diagnosis not present

## 2016-09-24 DIAGNOSIS — R29898 Other symptoms and signs involving the musculoskeletal system: Secondary | ICD-10-CM | POA: Diagnosis not present

## 2016-09-24 DIAGNOSIS — J45909 Unspecified asthma, uncomplicated: Secondary | ICD-10-CM | POA: Diagnosis not present

## 2016-09-24 DIAGNOSIS — Z8619 Personal history of other infectious and parasitic diseases: Secondary | ICD-10-CM | POA: Diagnosis not present

## 2016-09-24 DIAGNOSIS — Z792 Long term (current) use of antibiotics: Secondary | ICD-10-CM | POA: Diagnosis not present

## 2016-09-24 DIAGNOSIS — S82891A Other fracture of right lower leg, initial encounter for closed fracture: Secondary | ICD-10-CM | POA: Diagnosis not present

## 2016-09-24 DIAGNOSIS — M79604 Pain in right leg: Secondary | ICD-10-CM | POA: Diagnosis not present

## 2016-09-24 DIAGNOSIS — D860 Sarcoidosis of lung: Secondary | ICD-10-CM | POA: Diagnosis not present

## 2016-09-24 DIAGNOSIS — Z7952 Long term (current) use of systemic steroids: Secondary | ICD-10-CM | POA: Diagnosis not present

## 2016-09-24 DIAGNOSIS — J849 Interstitial pulmonary disease, unspecified: Secondary | ICD-10-CM | POA: Diagnosis not present

## 2016-09-24 DIAGNOSIS — F329 Major depressive disorder, single episode, unspecified: Secondary | ICD-10-CM | POA: Diagnosis not present

## 2016-09-26 DIAGNOSIS — J84112 Idiopathic pulmonary fibrosis: Secondary | ICD-10-CM | POA: Diagnosis not present

## 2016-09-26 DIAGNOSIS — I272 Pulmonary hypertension, unspecified: Secondary | ICD-10-CM | POA: Diagnosis not present

## 2016-09-26 DIAGNOSIS — D860 Sarcoidosis of lung: Secondary | ICD-10-CM | POA: Diagnosis not present

## 2016-09-26 DIAGNOSIS — K529 Noninfective gastroenteritis and colitis, unspecified: Secondary | ICD-10-CM | POA: Diagnosis not present

## 2016-09-26 DIAGNOSIS — E271 Primary adrenocortical insufficiency: Secondary | ICD-10-CM | POA: Diagnosis not present

## 2016-09-26 DIAGNOSIS — Z8701 Personal history of pneumonia (recurrent): Secondary | ICD-10-CM | POA: Diagnosis not present

## 2016-09-27 MED FILL — HYDROCODON-APAP 5-325: 5-325 | 20 days supply | Qty: 40 | Fill #0

## 2016-09-28 ENCOUNTER — Telehealth: Payer: Self-pay | Admitting: Family Medicine

## 2016-09-28 DIAGNOSIS — J84112 Idiopathic pulmonary fibrosis: Secondary | ICD-10-CM | POA: Diagnosis not present

## 2016-09-28 DIAGNOSIS — K529 Noninfective gastroenteritis and colitis, unspecified: Secondary | ICD-10-CM | POA: Diagnosis not present

## 2016-09-28 DIAGNOSIS — Z8701 Personal history of pneumonia (recurrent): Secondary | ICD-10-CM | POA: Diagnosis not present

## 2016-09-28 DIAGNOSIS — I272 Pulmonary hypertension, unspecified: Secondary | ICD-10-CM | POA: Diagnosis not present

## 2016-09-28 DIAGNOSIS — E271 Primary adrenocortical insufficiency: Secondary | ICD-10-CM | POA: Diagnosis not present

## 2016-09-28 DIAGNOSIS — D860 Sarcoidosis of lung: Secondary | ICD-10-CM | POA: Diagnosis not present

## 2016-09-28 NOTE — Telephone Encounter (Signed)
His give verbal order to go ahead and extend physical therapy thank you

## 2016-09-28 NOTE — Telephone Encounter (Signed)
Amy at PT given verbal order to extend PT as requested

## 2016-09-28 NOTE — Telephone Encounter (Signed)
Amy, PT at [facility], requesting to extend home health physical therapy for 3 additional weeks.

## 2016-10-03 ENCOUNTER — Other Ambulatory Visit: Payer: Self-pay | Admitting: Family Medicine

## 2016-10-03 DIAGNOSIS — D860 Sarcoidosis of lung: Secondary | ICD-10-CM | POA: Diagnosis not present

## 2016-10-03 DIAGNOSIS — E271 Primary adrenocortical insufficiency: Secondary | ICD-10-CM | POA: Diagnosis not present

## 2016-10-03 DIAGNOSIS — K529 Noninfective gastroenteritis and colitis, unspecified: Secondary | ICD-10-CM | POA: Diagnosis not present

## 2016-10-03 DIAGNOSIS — I272 Pulmonary hypertension, unspecified: Secondary | ICD-10-CM | POA: Diagnosis not present

## 2016-10-03 DIAGNOSIS — Z8701 Personal history of pneumonia (recurrent): Secondary | ICD-10-CM | POA: Diagnosis not present

## 2016-10-03 DIAGNOSIS — J84112 Idiopathic pulmonary fibrosis: Secondary | ICD-10-CM | POA: Diagnosis not present

## 2016-10-03 NOTE — Telephone Encounter (Signed)
Medication originally prescribed by another Doctor. May we refill?

## 2016-10-03 NOTE — Telephone Encounter (Signed)
May refill 4 

## 2016-10-04 ENCOUNTER — Ambulatory Visit (INDEPENDENT_AMBULATORY_CARE_PROVIDER_SITE_OTHER): Payer: 59 | Admitting: Psychiatry

## 2016-10-04 ENCOUNTER — Encounter (HOSPITAL_COMMUNITY): Payer: Self-pay | Admitting: Psychiatry

## 2016-10-04 VITALS — BP 104/73 | HR 78 | Ht 70.0 in | Wt 206.4 lb

## 2016-10-04 DIAGNOSIS — Z803 Family history of malignant neoplasm of breast: Secondary | ICD-10-CM | POA: Diagnosis not present

## 2016-10-04 DIAGNOSIS — Z8042 Family history of malignant neoplasm of prostate: Secondary | ICD-10-CM | POA: Diagnosis not present

## 2016-10-04 DIAGNOSIS — F411 Generalized anxiety disorder: Secondary | ICD-10-CM

## 2016-10-04 DIAGNOSIS — Z8249 Family history of ischemic heart disease and other diseases of the circulatory system: Secondary | ICD-10-CM

## 2016-10-04 DIAGNOSIS — F332 Major depressive disorder, recurrent severe without psychotic features: Secondary | ICD-10-CM

## 2016-10-04 DIAGNOSIS — Z818 Family history of other mental and behavioral disorders: Secondary | ICD-10-CM | POA: Diagnosis not present

## 2016-10-04 DIAGNOSIS — Z888 Allergy status to other drugs, medicaments and biological substances status: Secondary | ICD-10-CM | POA: Diagnosis not present

## 2016-10-04 DIAGNOSIS — Z91030 Bee allergy status: Secondary | ICD-10-CM

## 2016-10-04 DIAGNOSIS — Z79899 Other long term (current) drug therapy: Secondary | ICD-10-CM | POA: Diagnosis not present

## 2016-10-04 MED ORDER — TRAZODONE HCL 50 MG PO TABS: 50.0000 mg | ORAL_TABLET | Freq: Every day | ORAL | 2 refills | Status: DC

## 2016-10-04 MED ORDER — SERTRALINE HCL 100 MG PO TABS: 100.0000 mg | ORAL_TABLET | Freq: Every day | ORAL | 2 refills | Status: DC

## 2016-10-04 MED ORDER — MIDODRINE HCL 5 MG PO TABS: 5.0000 mg | ORAL_TABLET | Freq: Two times a day (BID) | ORAL | 4 refills | Status: DC

## 2016-10-04 MED FILL — MIDODRINE HCL 5 MG TABLET: 5 | 30 days supply | Qty: 60 | Fill #0

## 2016-10-04 MED FILL — SERTRALINE HCL 100 MG TAB: 100 | 30 days supply | Qty: 30 | Fill #0

## 2016-10-04 MED FILL — traZODone HCL 50 MG TABS: 50 | 30 days supply | Qty: 30 | Fill #0
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
## 2016-10-05 DIAGNOSIS — I272 Pulmonary hypertension, unspecified: Secondary | ICD-10-CM | POA: Diagnosis not present

## 2016-10-05 DIAGNOSIS — J84112 Idiopathic pulmonary fibrosis: Secondary | ICD-10-CM | POA: Diagnosis not present

## 2016-10-05 DIAGNOSIS — K529 Noninfective gastroenteritis and colitis, unspecified: Secondary | ICD-10-CM | POA: Diagnosis not present

## 2016-10-05 DIAGNOSIS — E271 Primary adrenocortical insufficiency: Secondary | ICD-10-CM | POA: Diagnosis not present

## 2016-10-05 DIAGNOSIS — D860 Sarcoidosis of lung: Secondary | ICD-10-CM | POA: Diagnosis not present

## 2016-10-05 DIAGNOSIS — Z8701 Personal history of pneumonia (recurrent): Secondary | ICD-10-CM | POA: Diagnosis not present

## 2016-10-09 ENCOUNTER — Encounter: Payer: Self-pay | Admitting: Cardiology

## 2016-10-09 NOTE — Progress Notes (Signed)
Cardiology Office Note  Date: 10/10/2016   ID: DAIRA HAUN, DOB 1961-04-22, MRN [id number]  PCP: Sallee Lange, MD  Consulting Cardiologist: Rozann Lesches, MD   Chief Complaint  Patient presents with  . Diastolic dysfunction    History of Present Illness: Kathleen [name] is a 56 y.o. female referred for cardiology consultation by Dr. Wolfgang Phoenix. I reviewed extensive records and updated the chart. She had hospital stay between [facility] and [facility] back in December 2017. At that time she presented after an episode of apparent gastroenteritis with poor oral intake as well as nausea and emesis. It was felt that this led to an adrenal crisis with subsequent hypotension. In the setting of volume resuscitation she ultimately developed hypoxic respiratory failure with volume overload, and was also found to be bradycardic with intermittent pauses on telemetry. She ultimately required support with mechanical ventilation and diuresis as well as pressor support and broad-spectrum antibiotics for the possibility of septic shock. Cardiology was involved with transient atrial fibrillation that ultimately converted to sinus rhythm with amiodarone. Report from [facility] indicates the chest CT did not show pulmonary embolus or significant pulmonary edema. She eventually improved with supportive measures. No other specific measures were required to support her heart rhythm. She was placed on midodrine while at [facility] for blood pressure support.  She follows with Pulmonary at [facility] for management of sarcoidosis, I reviewed the recent note from Dr. Farley Ly for post hospital follow-up of above condition. She has had no recent medication adjustments in her pulmonary regimen.   Chart review indicates prior cardiology evaluation by Dr. Acie Fredrickson in 2013. She has had negative ischemic testing in the past, no clearly documented CAD.  Today she states that she is essentially back at baseline. She does not report any sudden dizziness or  syncope, no exertional chest pain or palpitations. Medications include prednisone 5 mg daily, MDIs, and she also remains on midodrine.  Past Medical History:  Diagnosis Date  . Anemia   . Avascular bone necrosis ([place])   . Breast fibrocystic disorder   . Fibromyalgia   . Gastroesophageal reflux disease   . Mitral valve prolapse   . Restrictive lung disease    Moderate to severe  . Sarcoidosis ([place])    Biopsy proven - [facility]    Past Surgical History:  Procedure Laterality Date  . BREAST LUMPECTOMY  01/12/2011   right  . TUBAL LIGATION      Current Outpatient Prescriptions  Medication Sig Dispense Refill  . acetaminophen (TYLENOL) 325 MG tablet Take 2 tablets (650 mg total) by mouth every 6 (six) hours as needed for mild pain (or Fever >/= 101).    Marland Kitchen albuterol (PROVENTIL) (2.5 MG/3ML) 0.083% nebulizer solution Take 3 mLs (2.5 mg total) by nebulization every 2 (two) hours as needed for wheezing or shortness of breath. 75 mL 12  . budesonide (PULMICORT) 0.5 MG/2ML nebulizer solution Take 2 mLs (0.5 mg total) by nebulization daily as needed (for shortness of breath).  12  . ferrous sulfate 325 (65 FE) MG EC tablet Take 325 mg by mouth daily with breakfast.    . HYDROcodone-acetaminophen (NORCO/VICODIN) 5-325 MG tablet Take 1 tablet by mouth 2 (two) times daily as needed. 40 tablet 0  . midodrine (PROAMATINE) 5 MG tablet Take 1 tablet (5 mg total) by mouth 2 (two) times daily with a meal. 60 tablet 4  . pantoprazole (PROTONIX) 20 MG tablet Take 20 mg by mouth daily.    . predniSONE (  DELTASONE) 5 MG tablet Take 5 mg by mouth daily with breakfast.    . sertraline (ZOLOFT) 100 MG tablet Take 1 tablet (100 mg total) by mouth daily. 30 tablet 2  . traZODone (DESYREL) 50 MG tablet Take 1 tablet (50 mg total) by mouth at bedtime. 30 tablet 2   No current facility-administered medications for this visit.    Allergies:  Bee venom; Codeine; Diphenhydramine; and Morphine and related   Social  History: The patient  reports that she has never smoked. She has never used smokeless tobacco. She reports that she does not drink alcohol or use drugs.   Family History: The patient's family history includes Bipolar disorder in her daughter; Breast cancer in her mother; Cardiomyopathy in her mother; Heart disease in her maternal aunt; Neurofibromatosis in her brother; Prostate cancer in her father.   ROS:  Please see the history of present illness. Otherwise, complete review of systems is positive for chronic dyspnea on exertion.  All other systems are reviewed and negative.   Physical Exam: VS:  BP 102/60 (BP Location: Left Arm)   Pulse 87   Ht 5\' 10"  (1.778 m)   Wt 208 lb (94.3 kg)   SpO2 93%   BMI 29.84 kg/m , BMI Body mass index is 29.84 kg/m.  Wt Readings from Last 3 Encounters:  10/10/16 208 lb (94.3 kg)  10/04/16 206 lb 6.4 oz (93.6 kg)  09/18/16 203 lb 9.6 oz (92.4 kg)    General: Overweight woman, appears comfortable at rest. HEENT: Conjunctiva and lids normal, oropharynx clear. Neck: Supple, no elevated JVP or carotid bruits, no thyromegaly. Lungs: Coarse breath sounds, nonlabored breathing at rest. Cardiac: Regular rate and rhythm, no S3, soft apical systolic murmur, no pericardial rub. Abdomen: Soft, nontender, bowel sounds present, no guarding or rebound. Extremities: No pitting edema, distal pulses 2+. Skin: Warm and dry. Musculoskeletal: No kyphosis. Neuropsychiatric: Alert and oriented x3, affect grossly appropriate.  ECG: I personally reviewed the tracing from 08/21/2016 which showed sinus rhythm with short PR interval, nonspecific ST-T changes.  Recent Labwork: 08/18/2016: TSH 0.463 08/19/2016: B Natriuretic Peptide 382.4 08/21/2016: ALT 16; AST 25; BUN 9; Creatinine, Ser 0.99; Hemoglobin 13.0; Magnesium 2.6; Platelets 410; Potassium 3.6; Sodium 136     Component Value Date/Time   CHOL 221 (H) 07/27/2016 1046   TRIG 193 (H) 07/27/2016 1046   HDL 49  07/27/2016 1046   CHOLHDL 4.5 (H) 07/27/2016 1046   CHOLHDL 4.0 08/26/2014 0831   VLDL 31 08/26/2014 0831   LDLCALC 133 (H) 07/27/2016 1046    Other Studies Reviewed Today:  Echocardiograph 08/19/2016: Study Conclusions  - Left ventricle: The cavity size was normal. There was moderate   concentric hypertrophy. Systolic function was normal. The   estimated ejection fraction was in the range of 50% to 55%. Wall   motion was normal; there were no regional wall motion   abnormalities. Features are consistent with a pseudonormal left   ventricular filling pattern, with concomitant abnormal relaxation   and increased filling pressure (grade 2 diastolic dysfunction).   Doppler parameters are consistent with elevated ventricular   end-diastolic filling pressure. - Aortic valve: Trileaflet; normal thickness leaflets. There was no   regurgitation. - Aortic root: The aortic root was normal in size. - Right ventricle: The cavity size was mildly dilated. Wall   thickness was normal. Systolic function was mildly reduced. - Right atrium: The atrium was normal in size. - Tricuspid valve: There was mild regurgitation. - Pulmonary arteries:  Systolic pressure was severely increased. PA   peak pressure: 54 mm Hg (S). - Inferior vena cava: The vessel was dilated. The respirophasic   diameter changes were blunted (< 50%), consistent with elevated   central venous pressure. - Pericardium, extracardiac: There was no pericardial effusion.  Impressions:  - LVEF low normal 50-55%, mild diffuse hypokinesis.   RVEF mildly decreased.   Severe pulmonary hypertension.  Lexiscan Myoview 08/08/2011: Impression Exercise Capacity:  Lexiscan with no exercise. BP Response:  n/a  Clinical Symptoms:  n/a ECG Impression:  Mild worsening of diffuse T-wave inversion with Lexiscan Comparison with Prior Nuclear Study: No previous nuclear study performed  Overall Impression:  There is significant gut uptake  overlying the inferior wall which lowers the sensitivity of this study but LV perfusion appears normal. LV function is normal. The RV appears dilated and there is significant tracer uptake over the RV suggesting elevated R sided pressures. Would consider echocardiogram to evaluate for pulmonary HTN.   Assessment and Plan:  1. Patient with biopsy-proven pulmonary sarcoidosis and moderate to severe restrictive lung disease, followed by Dr. Farley Ly in the Pulmonary Division at [facility]. I reviewed available records which do not indicate any long-standing pulmonary hypertension, although her echocardiogram from December 2017 revealed a PASP of 54 mmHg in the setting of acute illness. I have recommended a follow-up echocardiogram now that she is back at baseline reassess. These results could be forwarded to Dr. Farley Ly.  2. Diastolic dysfunction by echocardiogram in December 2017. Not entirely clear that this is of major clinical significance at this time (ie: doubt that it explains her presentation back in December 2017). She did require diuretics in the setting of iatrogenic volume overload, but is not on a standing diuretic at baseline, and I would be wary of putting her on one given low-normal blood pressure requiring midodrine and decompensation that has been noted in the setting of volume contraction.  3. Transient bradycardia and PAF in the setting of acute illness. Heart rate is normal today. Would simply follow for now in the absence of unexplained syncope or progressive symptoms. It is possible that she could have sarcoid heart disease with affect on the conduction system, so symptomatic bradycardia in the future may need to be addressed. A cardiac MRI could always be considered to evaluate this further.  4. History of mitral valve prolapse although not evident by her most recent echocardiogram in December 2017.  Current medicines were reviewed with the patient today.   Orders Placed This Encounter    Procedures  . ECHOCARDIOGRAM COMPLETE    Disposition: Call with chest results and also forward to Dr. Farley Ly.  Signed, Satira Sark, MD, [facility] 10/10/2016 2:01 PM    [place] [facility] [address] [place], [place] [address] Phone: [phone]; Fax: [phone]

## 2016-10-10 ENCOUNTER — Encounter: Payer: Self-pay | Admitting: Cardiology

## 2016-10-10 ENCOUNTER — Ambulatory Visit (INDEPENDENT_AMBULATORY_CARE_PROVIDER_SITE_OTHER): Payer: 59 | Admitting: Cardiology

## 2016-10-10 VITALS — BP 102/60 | HR 87 | Ht 70.0 in | Wt 208.0 lb

## 2016-10-10 DIAGNOSIS — I519 Heart disease, unspecified: Secondary | ICD-10-CM | POA: Diagnosis not present

## 2016-10-10 DIAGNOSIS — I5189 Other ill-defined heart diseases: Secondary | ICD-10-CM

## 2016-10-10 DIAGNOSIS — Z87898 Personal history of other specified conditions: Secondary | ICD-10-CM

## 2016-10-10 DIAGNOSIS — Z8679 Personal history of other diseases of the circulatory system: Secondary | ICD-10-CM | POA: Diagnosis not present

## 2016-10-10 DIAGNOSIS — K529 Noninfective gastroenteritis and colitis, unspecified: Secondary | ICD-10-CM | POA: Diagnosis not present

## 2016-10-10 DIAGNOSIS — J84112 Idiopathic pulmonary fibrosis: Secondary | ICD-10-CM | POA: Diagnosis not present

## 2016-10-10 DIAGNOSIS — Z8701 Personal history of pneumonia (recurrent): Secondary | ICD-10-CM | POA: Diagnosis not present

## 2016-10-10 DIAGNOSIS — D860 Sarcoidosis of lung: Secondary | ICD-10-CM

## 2016-10-10 DIAGNOSIS — E271 Primary adrenocortical insufficiency: Secondary | ICD-10-CM | POA: Diagnosis not present

## 2016-10-10 DIAGNOSIS — I272 Pulmonary hypertension, unspecified: Secondary | ICD-10-CM

## 2016-10-10 NOTE — Patient Instructions (Signed)
Medication Instructions:  Your physician recommends that you continue on your current medications as directed. Please refer to the Current Medication list given to you today.   Labwork: none  Testing/Procedures: Your physician has requested that you have an echocardiogram. Echocardiography is a painless test that uses sound waves to create images of your heart. It provides your doctor with information about the size and shape of your heart and how well your heart's chambers and valves are working. This procedure takes approximately one hour. There are no restrictions for this procedure.    Follow-Up: Your physician recommends that you schedule a follow-up appointment in: to be determined, will call with results   Any Other Special Instructions Will Be Listed Below (If Applicable).     If you need a refill on your cardiac medications before your next appointment, please call your pharmacy.

## 2016-10-12 DIAGNOSIS — E271 Primary adrenocortical insufficiency: Secondary | ICD-10-CM | POA: Diagnosis not present

## 2016-10-12 DIAGNOSIS — K529 Noninfective gastroenteritis and colitis, unspecified: Secondary | ICD-10-CM | POA: Diagnosis not present

## 2016-10-12 DIAGNOSIS — J84112 Idiopathic pulmonary fibrosis: Secondary | ICD-10-CM | POA: Diagnosis not present

## 2016-10-12 DIAGNOSIS — Z8701 Personal history of pneumonia (recurrent): Secondary | ICD-10-CM | POA: Diagnosis not present

## 2016-10-12 DIAGNOSIS — I272 Pulmonary hypertension, unspecified: Secondary | ICD-10-CM | POA: Diagnosis not present

## 2016-10-12 DIAGNOSIS — D860 Sarcoidosis of lung: Secondary | ICD-10-CM | POA: Diagnosis not present

## 2016-10-15 ENCOUNTER — Ambulatory Visit (HOSPITAL_COMMUNITY)
Admission: RE | Admit: 2016-10-15 | Discharge: 2016-10-15 | Disposition: A | Discharge status: Home / Self Care | Payer: 59 | Source: Ambulatory Visit | Attending: Cardiology | Admitting: Cardiology

## 2016-10-15 ENCOUNTER — Telehealth (HOSPITAL_COMMUNITY): Payer: Self-pay | Admitting: *Deleted

## 2016-10-15 DIAGNOSIS — E785 Hyperlipidemia, unspecified: Secondary | ICD-10-CM | POA: Insufficient documentation

## 2016-10-15 DIAGNOSIS — I272 Pulmonary hypertension, unspecified: Secondary | ICD-10-CM | POA: Diagnosis not present

## 2016-10-15 NOTE — Telephone Encounter (Signed)
Office received refill request for pt Xanax from [facility]. Called pharmacy and informed Aaron Edelman that as of 10-04-2016 notes on file, pt is no longer taking medication. Aaron Edelman verbalized understanding.

## 2016-10-15 NOTE — Progress Notes (Signed)
*  PRELIMINARY RESULTS* Echocardiogram 2D Echocardiogram has been performed.  Kathleen [name] 10/15/2016, 10:49 AM

## 2016-10-16 ENCOUNTER — Encounter: Payer: Self-pay | Admitting: "Endocrinology

## 2016-10-16 ENCOUNTER — Ambulatory Visit (INDEPENDENT_AMBULATORY_CARE_PROVIDER_SITE_OTHER): Payer: 59 | Admitting: "Endocrinology

## 2016-10-16 VITALS — BP 114/77 | HR 89 | Ht 70.0 in | Wt 208.0 lb

## 2016-10-16 DIAGNOSIS — E2740 Unspecified adrenocortical insufficiency: Secondary | ICD-10-CM

## 2016-10-16 MED ORDER — PREDNISONE 10 MG PO TABS: 10.0000 mg | ORAL_TABLET | Freq: Every day | ORAL | 12 refills | Status: DC

## 2016-10-16 MED FILL — predniSONE 10 MG TABS: 10 | 30 days supply | Qty: 30 | Fill #0

## 2016-10-16 NOTE — Progress Notes (Signed)
Subjective:    Patient ID: Kathleen [name], female    DOB: July 10, 1961, PCP Sallee Lange, MD   Past Medical History:  Diagnosis Date  . Anemia   . Avascular bone necrosis ([place])   . Breast fibrocystic disorder   . Fibromyalgia   . Gastroesophageal reflux disease   . Mitral valve prolapse   . Restrictive lung disease    Moderate to severe  . Sarcoidosis ([place])    Biopsy proven - [facility]   Past Surgical History:  Procedure Laterality Date  . BREAST LUMPECTOMY  01/12/2011   right  . TUBAL LIGATION     Social History   Social History  . Marital status: Married    Spouse name: N/A  . Number of children: 1  . Years of education: N/A   Occupational History  . [employer]   Social History Main Topics  . Smoking status: Never Smoker  . Smokeless tobacco: Never Used  . Alcohol use No  . Drug use: No  . Sexual activity: Yes   Other Topics Concern  . None   Social History Narrative   Daily caffeine    Outpatient Encounter Prescriptions as of 10/16/2016  Medication Sig  . acetaminophen (TYLENOL) 325 MG tablet Take 2 tablets (650 mg total) by mouth every 6 (six) hours as needed for mild pain (or Fever >/= 101).  Marland Kitchen albuterol (PROVENTIL) (2.5 MG/3ML) 0.083% nebulizer solution Take 3 mLs (2.5 mg total) by nebulization every 2 (two) hours as needed for wheezing or shortness of breath.  . budesonide (PULMICORT) 0.5 MG/2ML nebulizer solution Take 2 mLs (0.5 mg total) by nebulization daily as needed (for shortness of breath).  . ferrous sulfate 325 (65 FE) MG EC tablet Take 325 mg by mouth daily with breakfast.  . HYDROcodone-acetaminophen (NORCO/VICODIN) 5-325 MG tablet Take 1 tablet by mouth 2 (two) times daily as needed.  . midodrine (PROAMATINE) 5 MG tablet Take 1 tablet (5 mg total) by mouth 2 (two) times daily with a meal.  . pantoprazole (PROTONIX) 20 MG tablet Take 20 mg by mouth daily.  . predniSONE (DELTASONE) 10 MG tablet Take 1 tablet (10 mg total) by mouth  daily with breakfast.  . sertraline (ZOLOFT) 100 MG tablet Take 1 tablet (100 mg total) by mouth daily.  . traZODone (DESYREL) 50 MG tablet Take 1 tablet (50 mg total) by mouth at bedtime.  . [DISCONTINUED] predniSONE (DELTASONE) 5 MG tablet Take 10 mg by mouth daily with breakfast.   No facility-administered encounter medications on file as of 10/16/2016.    ALLERGIES: Allergies  Allergen Reactions  . Bee Venom Anaphylaxis  . Codeine Itching    Other reaction(s): ITCHING  . Diphenhydramine Nausea Only    "makes my throat close up" "makes my throat close up"  . Morphine And Related Other (See Comments)    Unknown reaction- pt gets sick, dizzy, and confused Intolerance, not an allergy, received this in the emergency department recently without a problem.    VACCINATION STATUS: Immunization History  Administered Date(s) Administered  . Influenza Whole 06/27/2010, 07/28/2011  . Pneumococcal Conjugate-13 06/15/2014  . Pneumococcal Polysaccharide-23 06/10/2013    HPI   56 yr old female patient with medical history as above. She is being seen in consultation for history of adrenal insufficiency requested by Dr. Wolfgang Phoenix. - Her history is complicated including sarcoidosis which she dealt with for the last 10 years. Her therapy included high-dose steroids, up to 60 mg daily of  prednisone, for the last 4-6 years. He was recently tapered to prednisone 5 mg by mouth every morning. - Last month,  she was hospitalized for what appears to be septic shock. She required mechanical ventilation for respiratory failure and was transferred to [facility] for pulmonary care. - She denies interrupting her steroids replacement prior to her hospitalization. - The details of her diagnosis with adrenal insufficiency  is not available to review. On 08/22/2016 her cortisol level was 24.6, however at that time she was on prednisone already. -She does not wear medic alert. She denies any history of tuberculosis,  incarceration, nor overseas travel . -She denies family history of adrenal, pituitary dysfunction. She has family history of hypothyroidism one sibling. - Denies any history of intra-abdominal injury. She is on hydrocodone for chronic pain and inhaled bronchodilators. She is known to have orthostatic hypotension for which she is on midodrine. -She does not have acute complaints today.  Review of Systems  Constitutional: She does have steady weight lately after progressive weight gain over the years. + fatigue, no subjective hyperthermia, no subjective hypothermia Eyes: no blurry vision, no xerophthalmia ENT: no sore throat, no nodules palpated in throat, no dysphagia/odynophagia, no hoarseness Cardiovascular: no Chest Pain, no Shortness of Breath, no palpitations, no leg swelling Respiratory:  + Intermittent nonproductive cough, no SOB Gastrointestinal: no Nausea/Vomiting/Diarhhea Musculoskeletal: no muscle/joint aches Skin: no rashes Neurological: no tremors, no numbness, no tingling, no dizziness Psychiatric: no depression, no anxiety  Objective:    BP 114/77   Pulse 89   Ht 5\' 10"  (1.778 m)   Wt 208 lb (94.3 kg)   BMI 29.84 kg/m   Wt Readings from Last 3 Encounters:  10/16/16 208 lb (94.3 kg)  10/10/16 208 lb (94.3 kg)  10/04/16 206 lb 6.4 oz (93.6 kg)    Physical Exam  Constitutional: Moderately over- weight for hight, not in acute distress, normal state of mind Eyes: PERRLA, EOMI, no exophthalmos ENT: moist mucous membranes, no thyromegaly, no cervical lymphadenopathy Cardiovascular: normal precordial activity, Regular Rate and Rhythm, no Murmur/Rubs/Gallops Respiratory:  adequate breathing efforts, no gross chest deformity, Clear to auscultation bilaterally Gastrointestinal: abdomen soft, Non -tender, No distension, Bowel Sounds present Musculoskeletal: no gross deformities, strength intact in all four extremities Skin: moist, warm, no rashes Neurological: no tremor  with outstretched hands, Deep tendon reflexes normal in all four extremities.  CMP     Component Value Date/Time   NA 136 08/21/2016 1620   NA 142 07/27/2016 1046   K 3.6 08/21/2016 1620   CL 102 08/21/2016 1620   CO2 25 08/21/2016 1620   GLUCOSE 205 (H) 08/21/2016 1620   BUN 9 08/21/2016 1620   BUN 12 07/27/2016 1046   CREATININE 0.99 08/21/2016 1620   CREATININE 0.85 08/26/2014 0831   CALCIUM 7.9 (L) 08/21/2016 1620   PROT 6.6 08/21/2016 0305   PROT 6.9 07/27/2016 1046   ALBUMIN 3.6 08/21/2016 0305   ALBUMIN 4.2 07/27/2016 1046   AST 25 08/21/2016 0305   ALT 16 08/21/2016 0305   ALKPHOS 63 08/21/2016 0305   BILITOT 0.5 08/21/2016 0305   BILITOT 0.4 07/27/2016 1046   GFRNONAA >60 08/21/2016 1620   GFRAA >60 08/21/2016 1620     Diabetic Labs (most recent): Lab Results  Component Value Date   HGBA1C 5.3 08/21/2016    Lipid Panel     Component Value Date/Time   CHOL 221 (H) 07/27/2016 1046   TRIG 193 (H) 07/27/2016 1046   HDL 49 07/27/2016 1046  CHOLHDL 4.5 (H) 07/27/2016 1046   CHOLHDL 4.0 08/26/2014 0831   VLDL 31 08/26/2014 0831   LDLCALC 133 (H) 07/27/2016 1046      Assessment & Plan:   1. Adrenal insufficiency ([place]) -I have reviewed her available records and clinically evaluated this patient. She does have multiple risk factors for adrenal insufficiency including exposure to high-dose steroids related to her sarcoidosis. - She has taken prednisone up to 60 mg for 4-6 years prior to her diagnosis with adrenal insufficiency. -She still is subtly  symptomatic of adrenal insufficiency, may benefit from slight increase in her prednisone. I Advised her to increase her prednisone to 10 mg by mouth every morning. - At this time, it would be unsafe to withdraw steroids to do tests to specify  etiology of adrenal insufficiency such as Addison's disease. -Given her recent hospitalization for septic shock, I decided to keep replacing her until she becomes stable. I  advised her to wear medical alert depicting her adrenal insufficiency and need for daily steroids likely for life. - She will be evaluated for the need for mineralocorticoid replacement which may help to stabilize her blood pressure. - I had a long discussion with her regarding steroids sick day rules, risk of weight gain/diabetes and the need for periodic screening with A1c. -Her last A1c was 5.3% indicating absence of prediabetes/diabetes. -  - I advised patient to maintain close follow up with Sallee Lange, MD for primary care needs. Follow up plan: Return for follow up with pre-visit labs.  Glade Lloyd, MD Phone: [phone]  Fax: [phone]   10/16/2016, 1:01 PM

## 2016-10-19 DIAGNOSIS — K529 Noninfective gastroenteritis and colitis, unspecified: Secondary | ICD-10-CM | POA: Diagnosis not present

## 2016-10-19 DIAGNOSIS — J84112 Idiopathic pulmonary fibrosis: Secondary | ICD-10-CM | POA: Diagnosis not present

## 2016-10-19 DIAGNOSIS — E271 Primary adrenocortical insufficiency: Secondary | ICD-10-CM | POA: Diagnosis not present

## 2016-10-19 DIAGNOSIS — I272 Pulmonary hypertension, unspecified: Secondary | ICD-10-CM | POA: Diagnosis not present

## 2016-10-19 DIAGNOSIS — D860 Sarcoidosis of lung: Secondary | ICD-10-CM | POA: Diagnosis not present

## 2016-10-19 DIAGNOSIS — Z8701 Personal history of pneumonia (recurrent): Secondary | ICD-10-CM | POA: Diagnosis not present

## 2016-11-01 ENCOUNTER — Encounter: Payer: Self-pay | Admitting: Family Medicine

## 2016-11-01 ENCOUNTER — Ambulatory Visit (INDEPENDENT_AMBULATORY_CARE_PROVIDER_SITE_OTHER): Payer: 59 | Admitting: Family Medicine

## 2016-11-01 VITALS — BP 118/76 | Ht 70.0 in | Wt 207.0 lb

## 2016-11-01 DIAGNOSIS — I951 Orthostatic hypotension: Secondary | ICD-10-CM

## 2016-11-01 DIAGNOSIS — M87052 Idiopathic aseptic necrosis of left femur: Secondary | ICD-10-CM

## 2016-11-01 DIAGNOSIS — E2740 Unspecified adrenocortical insufficiency: Secondary | ICD-10-CM | POA: Diagnosis not present

## 2016-11-01 MED ORDER — HYDROCODONE-ACETAMINOPHEN 5-325 MG PO TABS: 1.0000 | ORAL_TABLET | Freq: Two times a day (BID) | ORAL | 0 refills | Status: DC | PRN

## 2016-11-01 MED FILL — HYDROCODON-APAP 5-325: 5-325 | 20 days supply | Qty: 40 | Fill #0

## 2016-11-01 NOTE — Progress Notes (Signed)
   Subjective:    Patient ID: Kathleen [name], female    DOB: Nov 27, 1960, 56 y.o.   MRN: [id number]  Hyperlipidemia  This is a chronic problem. The current episode started more than 1 year ago.   Pt states no concerns or problems today.  Patient had a fair amount of questions about her recent visits with cardiology including the echo results we reviewed those plus also her visit with Dr. Dorris Fetch in discussion about adrenal insufficiency. Her hip is doing well she only has to use hydrocodone intermittently  Review of Systems Denies any chest tightness pressure pain shortness breath nausea vomiting diarrhea    Objective:   Physical Exam Lungs clear heart regular pulse normal extremities no edema skin warm dry blood pressure good laying sitting standing is still on the low when she needs to continue the medication       Assessment & Plan:  #1 adrenal insufficiency continue prednisone long discussion held with the patient this is what she needs to do 10 mg daily  Hyperlipidemia-previous lab reviewed the patient know additional labs necessary currently  Hip pain and discomfort uses hydrocodone intermittently but sparingly  Orthostatic hypotension doing well on current medication continue midodrine  Recheck patient in approximate 3 months

## 2016-12-06 MED FILL — MIDODRINE HCL 5 MG TABLET: 5 | 30 days supply | Qty: 60 | Fill #1

## 2016-12-06 MED FILL — predniSONE 10 MG TABS: 10 | 30 days supply | Qty: 30 | Fill #1

## 2016-12-19 MED FILL — ALPRAZolam 1 MG TABS: 1 | 30 days supply | Qty: 90 | Fill #0

## 2016-12-31 ENCOUNTER — Other Ambulatory Visit: Payer: Self-pay | Admitting: "Endocrinology

## 2016-12-31 DIAGNOSIS — E2740 Unspecified adrenocortical insufficiency: Secondary | ICD-10-CM | POA: Diagnosis not present

## 2017-01-01 LAB — COMPREHENSIVE METABOLIC PANEL
ALT: 12 IU/L (ref 0–32)
AST: 12 IU/L (ref 0–40)
Albumin/Globulin Ratio: 1.6 (ref 1.2–2.2)
Albumin: 4.1 g/dL (ref 3.5–5.5)
Alkaline Phosphatase: 77 IU/L (ref 39–117)
BUN/Creatinine Ratio: 19 (ref 9–23)
BUN: 18 mg/dL (ref 6–24)
Bilirubin Total: 0.3 mg/dL (ref 0.0–1.2)
CO2: 23 mmol/L (ref 18–29)
Calcium: 9.7 mg/dL (ref 8.7–10.2)
Chloride: 102 mmol/L (ref 96–106)
Creatinine, Ser: 0.94 mg/dL (ref 0.57–1.00)
GFR calc Af Amer: 80 mL/min/{1.73_m2} (ref >59)
GFR calc non Af Amer: 69 mL/min/{1.73_m2} (ref >59)
Globulin, Total: 2.5 g/dL (ref 1.5–4.5)
Glucose: 79 mg/dL (ref 65–99)
Potassium: 4 mmol/L (ref 3.5–5.2)
Sodium: 141 mmol/L (ref 134–144)
Total Protein: 6.6 g/dL (ref 6.0–8.5)

## 2017-01-01 LAB — AMBIG ABBREV CMP14 DEFAULT

## 2017-01-01 LAB — TSH: TSH: 4.95 u[IU]/mL — ABNORMAL HIGH (ref 0.450–4.500)

## 2017-01-01 LAB — HGB A1C W/O EAG: Hgb A1c MFr Bld: 5 % (ref 4.8–5.6)

## 2017-01-01 LAB — T4, FREE: Free T4: 0.99 ng/dL (ref 0.82–1.77)

## 2017-01-09 DIAGNOSIS — R0602 Shortness of breath: Secondary | ICD-10-CM | POA: Diagnosis not present

## 2017-01-09 DIAGNOSIS — J849 Interstitial pulmonary disease, unspecified: Secondary | ICD-10-CM | POA: Diagnosis not present

## 2017-01-15 ENCOUNTER — Ambulatory Visit (INDEPENDENT_AMBULATORY_CARE_PROVIDER_SITE_OTHER): Payer: 59 | Admitting: "Endocrinology

## 2017-01-15 ENCOUNTER — Encounter: Payer: Self-pay | Admitting: "Endocrinology

## 2017-01-15 VITALS — BP 105/71 | HR 90 | Ht 70.0 in | Wt 211.0 lb

## 2017-01-15 DIAGNOSIS — E2740 Unspecified adrenocortical insufficiency: Secondary | ICD-10-CM | POA: Diagnosis not present

## 2017-01-15 DIAGNOSIS — E039 Hypothyroidism, unspecified: Secondary | ICD-10-CM | POA: Diagnosis not present

## 2017-01-15 MED ORDER — LEVOTHYROXINE SODIUM 50 MCG PO CAPS: 50.0000 ug | ORAL_CAPSULE | Freq: Every day | ORAL | 3 refills | Status: DC

## 2017-01-15 MED FILL — LEVOTHYROXINE 50 MCG TABLET: 50 | 30 days supply | Qty: 30 | Fill #0

## 2017-01-15 NOTE — Progress Notes (Signed)
Subjective:    Patient ID: Kathleen [name], female    DOB: 01/09/1961, PCP [name], MD   Past Medical History:  Diagnosis Date  . Anemia   . Avascular bone necrosis ([place])   . Breast fibrocystic disorder   . Fibromyalgia   . Gastroesophageal reflux disease   . Mitral valve prolapse   . Restrictive lung disease    Moderate to severe  . Sarcoidosis    Biopsy proven - [facility]   Past Surgical History:  Procedure Laterality Date  . BREAST LUMPECTOMY  01/12/2011   right  . TUBAL LIGATION     Social History   Social History  . Marital status: Married    Spouse name: N/A  . Number of children: 1  . Years of education: N/A   Occupational History  . [employer]   Social History Main Topics  . Smoking status: Never Smoker  . Smokeless tobacco: Never Used  . Alcohol use No  . Drug use: No  . Sexual activity: Yes   Other Topics Concern  . None   Social History Narrative   Daily caffeine    Outpatient Encounter Prescriptions as of 01/15/2017  Medication Sig  . acetaminophen (TYLENOL) 325 MG tablet Take 2 tablets (650 mg total) by mouth every 6 (six) hours as needed for mild pain (or Fever >/= 101).  Marland Kitchen albuterol (PROVENTIL) (2.5 MG/3ML) 0.083% nebulizer solution Take 3 mLs (2.5 mg total) by nebulization every 2 (two) hours as needed for wheezing or shortness of breath.  . budesonide (PULMICORT) 0.5 MG/2ML nebulizer solution Take 2 mLs (0.5 mg total) by nebulization daily as needed (for shortness of breath).  . ferrous sulfate 325 (65 FE) MG EC tablet Take 325 mg by mouth daily with breakfast.  . HYDROcodone-acetaminophen (NORCO/VICODIN) 5-325 MG tablet Take 1 tablet by mouth 2 (two) times daily as needed.  . Levothyroxine Sodium 50 MCG CAPS Take 1 capsule (50 mcg total) by mouth daily before breakfast.  . midodrine (PROAMATINE) 5 MG tablet Take 1 tablet (5 mg total) by mouth 2 (two) times daily with a meal.  . pantoprazole (PROTONIX) 20 MG tablet Take  20 mg by mouth daily.  . predniSONE (DELTASONE) 10 MG tablet Take 1 tablet (10 mg total) by mouth daily with breakfast.  . sertraline (ZOLOFT) 100 MG tablet Take 1 tablet (100 mg total) by mouth daily.  . traZODone (DESYREL) 50 MG tablet Take 1 tablet (50 mg total) by mouth at bedtime.   No facility-administered encounter medications on file as of 01/15/2017.    ALLERGIES: Allergies  Allergen Reactions  . Bee Venom Anaphylaxis  . Codeine Itching    Other reaction(s): ITCHING  . Morphine And Related Other (See Comments)    Unknown reaction- pt gets sick, dizzy, and confused Intolerance, not an allergy, received this in the emergency department recently without a problem.    VACCINATION STATUS: Immunization History  Administered Date(s) Administered  . Influenza Whole 06/27/2010, 07/28/2011  . Pneumococcal Conjugate-13 06/15/2014  . Pneumococcal Polysaccharide-23 06/10/2013    HPI   56 yr old female patient with medical history as above. She is being seen in f/u for history of adrenal insufficiency requested by Dr. Wolfgang Phoenix. - Her history is complicated including sarcoidosis which she dealt with for the last 10 years. Her therapy included high-dose steroids, up to 60 mg daily of prednisone, for the last 4-6 years. - Last month,  she was hospitalized for what appears  to be septic shock. She required mechanical ventilation for respiratory failure and was transferred to [facility] for pulmonary care.  - During her last visit, I have increased her prednisone to 10 mg by mouth daily. She continued to feel better, no hospitalization in the interim. - She has no new complaints today, except gaining 3 more pounds.   - She denies interrupting her steroids replacement. - The details of her diagnosis with adrenal insufficiency  is not available to review. On 08/22/2016 her cortisol level was 24.6, however at that time she was on prednisone already. -She does not wear medic alert. She denies any  history of tuberculosis, incarceration, nor overseas travel . -She denies family history of adrenal, pituitary dysfunction. She has family history of hypothyroidism one sibling. - Denies any history of intra-abdominal injury. She is on hydrocodone for chronic pain and inhaled bronchodilators. She is known to have orthostatic hypotension for which she is on midodrine.   Review of Systems  Constitutional: She has gained 3 pounds since last visit, + fatigue, no subjective hyperthermia, no subjective hypothermia Eyes: no blurry vision, no xerophthalmia ENT: no sore throat, no nodules palpated in throat, no dysphagia/odynophagia, no hoarseness Cardiovascular: no Chest Pain, no Shortness of Breath, no palpitations, no leg swelling Respiratory:  + Intermittent nonproductive cough, no SOB Gastrointestinal: no Nausea/Vomiting/Diarhhea Musculoskeletal: no muscle/joint aches Skin: no rashes Neurological: no tremors, no numbness, no tingling, no dizziness Psychiatric: no depression, no anxiety  Objective:    BP 105/71   Pulse 90   Ht 5\' 10"  (1.778 m)   Wt 211 lb (95.7 kg)   BMI 30.28 kg/m   Wt Readings from Last 3 Encounters:  01/15/17 211 lb (95.7 kg)  11/01/16 207 lb (93.9 kg)  10/16/16 208 lb (94.3 kg)    Physical Exam  Constitutional: Moderately over- weight for hight, not in acute distress, normal state of mind Eyes: PERRLA, EOMI, no exophthalmos ENT: moist mucous membranes, no thyromegaly, no cervical lymphadenopathy Cardiovascular: normal precordial activity, Regular Rate and Rhythm, no Murmur/Rubs/Gallops Respiratory:  adequate breathing efforts, no gross chest deformity, Clear to auscultation bilaterally Gastrointestinal: abdomen soft, Non -tender, No distension, Bowel Sounds present Musculoskeletal: no gross deformities, strength intact in all four extremities Skin: moist, warm, no rashes Neurological: no tremor with outstretched hands, Deep tendon reflexes normal in all four  extremities.  CMP     Component Value Date/Time   NA 141 12/31/2016 0817   K 4.0 12/31/2016 0817   CL 102 12/31/2016 0817   CO2 23 12/31/2016 0817   GLUCOSE 79 12/31/2016 0817   GLUCOSE 205 (H) 08/21/2016 1620   BUN 18 12/31/2016 0817   CREATININE 0.94 12/31/2016 0817   CREATININE 0.85 08/26/2014 0831   CALCIUM 9.7 12/31/2016 0817   PROT 6.6 12/31/2016 0817   ALBUMIN 4.1 12/31/2016 0817   AST 12 12/31/2016 0817   ALT 12 12/31/2016 0817   ALKPHOS 77 12/31/2016 0817   BILITOT 0.3 12/31/2016 0817   GFRNONAA 69 12/31/2016 0817   GFRAA 80 12/31/2016 0817     Diabetic Labs (most recent): Lab Results  Component Value Date   HGBA1C 5.0 12/31/2016   HGBA1C 5.3 08/21/2016    Lipid Panel     Component Value Date/Time   CHOL 221 (H) 07/27/2016 1046   TRIG 193 (H) 07/27/2016 1046   HDL 49 07/27/2016 1046   CHOLHDL 4.5 (H) 07/27/2016 1046   CHOLHDL 4.0 08/26/2014 0831   VLDL 31 08/26/2014 0831   LDLCALC 133 (H)  07/27/2016 1046    Results for JOLEIGH, MINEAU (MRN [id number]) as of 01/15/2017 09:11  Ref. Range 12/31/2016 08:17  TSH Latest Ref Range: 0.450 - 4.500 uIU/mL 4.950 (H)  T4,Free(Direct) Latest Ref Range: 0.82 - 1.77 ng/dL 0.99    Assessment & Plan:   1. Adrenal insufficiency ([place]) -I have reviewed her available records and clinically evaluated this patient. She does have multiple risk factors for adrenal insufficiency including exposure to high-dose steroids related to her sarcoidosis. - She has taken prednisone up to 60 mg for 4-6 years prior to her diagnosis with adrenal insufficiency. - She did better on current dose of prednisone 10 mg by mouth every morning.  - At this time, it would be unsafe to withdraw steroids to do tests to specify  etiology of adrenal insufficiency such as Addison's disease. -Given her recent hospitalization for septic shock, I decided to keep replacing her until she becomes stable. I advised her to wear medical alert depicting her adrenal  insufficiency and need for daily steroids likely for life. - She will be evaluated for the need for mineralocorticoid replacement which may help to stabilize her blood pressure. - I had a long discussion with her regarding steroids sick day rules, risk of weight gain/diabetes and the need for periodic screening with A1c. -Her last A1c was 5.3% indicating absence of prediabetes/diabetes.  2. Hypothyroidism: New diagnosis  - I discussed and initiated levothyroxine 50 g by mouth every morning.  - We discussed about correct intake of levothyroxine, at fasting, with water, separated by at least 30 minutes from breakfast, and separated by more than 4 hours from calcium, iron, multivitamins, acid reflux medications (PPIs). -Patient is made aware of the fact that thyroid hormone replacement is needed for life, dose to be adjusted by periodic monitoring of thyroid function tests. - I advised patient to maintain close follow up with [name], MD for primary care needs. Follow up plan: Return in about 3 months (around 04/17/2017) for follow up with pre-visit labs.  Glade Lloyd, MD Phone: [phone]  Fax: [phone]   01/15/2017, 9:22 AM

## 2017-01-18 MED FILL — predniSONE 10 MG TABS: 10 | 30 days supply | Qty: 30 | Fill #2

## 2017-02-04 DIAGNOSIS — R05 Cough: Secondary | ICD-10-CM | POA: Diagnosis not present

## 2017-02-04 MED FILL — AZITHROMYCIN 250 MG TAB: 250 | 30 days supply | Qty: 30 | Fill #0

## 2017-02-07 ENCOUNTER — Encounter: Payer: Self-pay | Admitting: Family Medicine

## 2017-02-07 ENCOUNTER — Ambulatory Visit (INDEPENDENT_AMBULATORY_CARE_PROVIDER_SITE_OTHER): Payer: 59 | Admitting: Family Medicine

## 2017-02-07 VITALS — BP 122/74 | Ht 70.0 in | Wt 212.2 lb

## 2017-02-07 DIAGNOSIS — E039 Hypothyroidism, unspecified: Secondary | ICD-10-CM | POA: Diagnosis not present

## 2017-02-07 DIAGNOSIS — G894 Chronic pain syndrome: Secondary | ICD-10-CM

## 2017-02-07 DIAGNOSIS — M85851 Other specified disorders of bone density and structure, right thigh: Secondary | ICD-10-CM | POA: Diagnosis not present

## 2017-02-07 DIAGNOSIS — E2740 Unspecified adrenocortical insufficiency: Secondary | ICD-10-CM

## 2017-02-07 DIAGNOSIS — I951 Orthostatic hypotension: Secondary | ICD-10-CM | POA: Diagnosis not present

## 2017-02-07 DIAGNOSIS — M85852 Other specified disorders of bone density and structure, left thigh: Secondary | ICD-10-CM | POA: Diagnosis not present

## 2017-02-07 MED ORDER — ALBUTEROL SULFATE (2.5 MG/3ML) 0.083% IN NEBU: 2.5000 mg | INHALATION_SOLUTION | RESPIRATORY_TRACT | 12 refills | Status: DC | PRN

## 2017-02-07 MED ORDER — HYDROCODONE-ACETAMINOPHEN 5-325 MG PO TABS: 1.0000 | ORAL_TABLET | Freq: Two times a day (BID) | ORAL | 0 refills | Status: DC | PRN

## 2017-02-07 MED ORDER — MIDODRINE HCL 5 MG PO TABS: 5.0000 mg | ORAL_TABLET | Freq: Two times a day (BID) | ORAL | 4 refills | Status: DC

## 2017-02-07 NOTE — Progress Notes (Signed)
Subjective:    Patient ID: Kathleen [name], female    DOB: [birth year], 56 y.o.   MRN: [id number]  HPI  Patient has a history of interstitial lung disease sees a specialist on a regular basis she's been on multiple medications. Her current lung condition is stable but she is disabled  She also has avascular necrosis of left hip related to previous treatments and steroids this causes a great deal of pain and discomfort that's in the hip and radiates down the leg surgeons have stated to her that it is not a surgical case at this point the pain medicine does give her some relief she uses it intermittently she does not abuse it  Drug registry was checked patient reliable in her history This patient was seen today for chronic pain  The medication list was reviewed and updated.    -Compliance with medication: States takes pain medication daily.   - Number patient states they take daily: States takes 1-2 tablets daily   -when was the last dose patient took? Last dose over a week ago.  The patient was advised the importance of maintaining medication and not using illegal substances with these.  Refills needed: Yes   The patient was educated that we can provide 3 monthly scripts for their medication, it is their responsibility to follow the instructions.  Side effects or complications from medications: None  Patient is aware that pain medications are meant to minimize the severity of the pain to allow their pain levels to improve to allow for better function. They are aware of that pain medications cannot totally remove their pain.  Due for UDT ( at least once per year) : Yes, Today    Patient would like to discuss midodrine medication.  She wonders if she needs to keep taking this medicine is being used for orthostatic hypotension  She does have adrenal insufficiency she seen a specialist for this they have her on prednisone that does concern her regarding her potential for osteoporosis.  She is due for a bone density. She is also moderately overweight she is try to watch her diet and stay active as possible  Review of Systems  Constitutional: Negative for activity change, appetite change and fatigue.  HENT: Negative for congestion, ear discharge and rhinorrhea.   Eyes: Negative for discharge.  Respiratory: Negative for cough, chest tightness and wheezing.   Cardiovascular: Negative for chest pain.  Gastrointestinal: Negative for abdominal pain and vomiting.  Genitourinary: Negative for difficulty urinating and frequency.  Musculoskeletal: Negative for neck pain.  Allergic/Immunologic: Negative for environmental allergies and food allergies.  Neurological: Negative for weakness and headaches.  Psychiatric/Behavioral: Negative for agitation and behavioral problems.       Objective:   Physical Exam  Constitutional: She is oriented to person, place, and time. She appears well-developed and well-nourished.  HENT:  Head: Normocephalic.  Right Ear: External ear normal.  Left Ear: External ear normal.  Eyes: Pupils are equal, round, and reactive to light.  Neck: Normal range of motion. No thyromegaly present.  Cardiovascular: Normal rate, regular rhythm, normal heart sounds and intact distal pulses.   No murmur heard. Pulmonary/Chest: Effort normal and breath sounds normal. No respiratory distress. She has no wheezes.  Abdominal: Soft. Bowel sounds are normal. She exhibits no distension and no mass. There is no tenderness.  Musculoskeletal: Normal range of motion. She exhibits no edema or tenderness.  Lymphadenopathy:    She has no cervical adenopathy.  Neurological: She is alert  and oriented to person, place, and time. She exhibits normal muscle tone.  Skin: Skin is warm and dry.  Psychiatric: She has a normal mood and affect. Her behavior is normal.          Assessment & Plan:  Orthostasis continuemidodrine-she is currently taking 1 per day, on today's evaluation  blood pressure in the low normal range but not orthostatic  Adrenal insufficiency long discussion about the importance prednisone on a daily basis she is worried that this will affect her bones and her avascular necrosis on the left hip but she is in a [place] and this is just what it is no good solution-she needs follow-up with endocrinology regarding this  Hypothyroidism continue current thyroid medicine. Patient to follow-up with endocrinology-eventually this is something that we can handle at our office.  The patient was seen today as part of a comprehensive visit regarding pain control. Patient's compliance with the medication as well as discussion regarding effectiveness was completed. Prescriptions were written. Patient was advised to follow-up in 3 months. The patient was assessed for any signs of severe side effects. The patient was advised to take the medicine as directed and to report to [place] if any side effect issues. Patient states she has not pain medicine she does not take it on a regular basis-prescriptions are given to the patient this should last her at least 3 months  Osteopenia bone density ordered  Lung disease stable  Follow-up in approximate 3 months

## 2017-02-19 ENCOUNTER — Other Ambulatory Visit: Payer: Self-pay | Admitting: Family Medicine

## 2017-02-19 MED FILL — traZODone HCL 50 MG TABS: 50 | 30 days supply | Qty: 30 | Fill #1

## 2017-02-19 MED FILL — LEVOTHYROXINE 50 MCG TABLET: 50 | 30 days supply | Qty: 30 | Fill #1

## 2017-02-19 MED FILL — predniSONE 10 MG TABS: 10 | 30 days supply | Qty: 30 | Fill #3

## 2017-02-19 MED FILL — MIDODRINE HCL 5 MG TABLET: 5 | 30 days supply | Qty: 60 | Fill #2

## 2017-02-19 MED FILL — SERTRALINE HCL 100 MG TAB: 100 | 30 days supply | Qty: 30 | Fill #1

## 2017-02-19 NOTE — Telephone Encounter (Signed)
Left message to return call 

## 2017-02-19 NOTE — Telephone Encounter (Signed)
Somewhat puzzling, find out from the patient is she on acid blocker pantoprazole currently if so may have 90 day with 3 refills

## 2017-02-20 MED FILL — PANTOPRAZOLE SOD DR 40 MG T: 40 | 90 days supply | Qty: 90 | Fill #0

## 2017-02-21 MED FILL — HYDROCODON-APAP 5-325: 5-325 | 30 days supply | Qty: 60 | Fill #0

## 2017-03-19 ENCOUNTER — Emergency Department (HOSPITAL_COMMUNITY): Payer: 59

## 2017-03-19 ENCOUNTER — Encounter (HOSPITAL_COMMUNITY): Payer: Self-pay

## 2017-03-19 ENCOUNTER — Emergency Department (HOSPITAL_COMMUNITY)
Admission: EM | Admit: 2017-03-19 | Discharge: 2017-03-19 | Disposition: A | Discharge status: Home / Self Care | Payer: 59 | Attending: Emergency Medicine | Admitting: Emergency Medicine

## 2017-03-19 DIAGNOSIS — Z79899 Other long term (current) drug therapy: Secondary | ICD-10-CM | POA: Insufficient documentation

## 2017-03-19 DIAGNOSIS — M25551 Pain in right hip: Secondary | ICD-10-CM | POA: Diagnosis not present

## 2017-03-19 DIAGNOSIS — E039 Hypothyroidism, unspecified: Secondary | ICD-10-CM | POA: Insufficient documentation

## 2017-03-19 DIAGNOSIS — M25572 Pain in left ankle and joints of left foot: Secondary | ICD-10-CM | POA: Diagnosis not present

## 2017-03-19 DIAGNOSIS — S79911A Unspecified injury of right hip, initial encounter: Secondary | ICD-10-CM | POA: Diagnosis not present

## 2017-03-19 DIAGNOSIS — W19XXXA Unspecified fall, initial encounter: Secondary | ICD-10-CM

## 2017-03-19 DIAGNOSIS — M25552 Pain in left hip: Secondary | ICD-10-CM | POA: Insufficient documentation

## 2017-03-19 DIAGNOSIS — I10 Essential (primary) hypertension: Secondary | ICD-10-CM | POA: Insufficient documentation

## 2017-03-19 DIAGNOSIS — R531 Weakness: Secondary | ICD-10-CM | POA: Diagnosis not present

## 2017-03-19 DIAGNOSIS — M25571 Pain in right ankle and joints of right foot: Secondary | ICD-10-CM | POA: Diagnosis not present

## 2017-03-19 DIAGNOSIS — M545 Low back pain: Secondary | ICD-10-CM | POA: Diagnosis not present

## 2017-03-19 DIAGNOSIS — M25559 Pain in unspecified hip: Secondary | ICD-10-CM | POA: Diagnosis present

## 2017-03-19 DIAGNOSIS — T148XXA Other injury of unspecified body region, initial encounter: Secondary | ICD-10-CM | POA: Diagnosis not present

## 2017-03-19 MED FILL — SERTRALINE HCL 100 MG TAB: 100 | 30 days supply | Qty: 30 | Fill #2

## 2017-03-19 MED FILL — LEVOTHYROXINE 50 MCG TABLET: 50 | 30 days supply | Qty: 30 | Fill #2

## 2017-03-19 MED FILL — MIDODRINE HCL 5 MG TABLET: 5 | 30 days supply | Qty: 60 | Fill #3

## 2017-03-19 MED FILL — traZODone HCL 50 MG TABS: 50 | 30 days supply | Qty: 30 | Fill #2

## 2017-03-19 MED FILL — predniSONE 10 MG TABS: 10 | 30 days supply | Qty: 30 | Fill #4

## 2017-03-19 NOTE — ED Triage Notes (Signed)
Per EMS, pt slipped on some water and fell, injuring her left hip, leg, and foot. Pt has hx of avascular necrosis.

## 2017-03-19 NOTE — Discharge Instructions (Signed)
Take ibuprofen for pain and inflammation. Follow up with PCP for further evaluation if symptoms do not improve. Return to ED for worsening pain, trouble walking, numbness, weakness, additional injury.

## 2017-03-19 NOTE — ED Provider Notes (Signed)
[facility] DEPT Provider Note   CSN: [id number] Arrival date & time: 03/19/17  1544     History   Chief Complaint No chief complaint on file.   HPI Kathleen [name] is a 56 y.o. female.  HPI Patient, with past medical history of avascular necrosis from chronic prednisone use due to restrictive lung disease, presents to ED after mechanical fall that occurred prior to arrival. She states that she was visiting her father when she slipped on a wet floor in her left leg went forward and her right leg went back. She denies any head injury or loss of consciousness. She states that she has been ambulatory since the accident but is having bilateral hip pain down to her feet. She also reports lower back pain. She denies any numbness, weakness, urinary incontinence, prior back surgeries, history of cancer, history of IV drug use.  Past Medical History:  Diagnosis Date  . Anemia   . Avascular bone necrosis ([place])   . Breast fibrocystic disorder   . Fibromyalgia   . Gastroesophageal reflux disease   . Hypertension   . Mitral valve prolapse   . Restrictive lung disease    Moderate to severe  . Sarcoidosis    Biopsy proven - [facility]    Patient Active Problem List   Diagnosis Date Noted  . Hypothyroidism 01/15/2017  . Septic shock ([place])   . Elevated troponin   . Symptomatic bradycardia   . Acute respiratory failure with hypoxia ([place])   . Adrenal insufficiency ([place]) 08/17/2016  . Hypokalemia 08/17/2016  . Chronic pain syndrome 04/20/2016  . Vaginal bleeding 01/02/2016  . Acute blood loss anemia 12/31/2015  . Gastroenteritis   . Hypovolemic shock ([place]) 12/30/2015  . Nausea and vomiting 12/29/2015  . Hypotension 12/29/2015  . Dehydration 12/28/2015  . Constipation 11/14/2015  . Avascular necrosis of bone of left hip ([place]) 04/05/2015  . Bimalleolar fracture of right ankle 02/11/2015  . Post herpetic neuralgia 10/22/2014  . Hyperlipidemia 08/25/2014  . Obstructive sleep apnea  02/18/2014  . Obesity, unspecified 05/06/2013  . Fibromyalgia 02/11/2013  . Depression 02/11/2013  . Abdominal pain, other specified site 02/11/2013  . Shingles 02/02/2013  . Urinary retention 02/02/2013  . Pedal edema 01/22/2013  . Orthostatic hypotension 01/16/2013  . Disturbance of skin sensation 11/26/2012  . Pain in limb 11/26/2012  . Helicobacter pylori gastritis 05/23/2012  . Cough 05/02/2012  . Fibrocystic breast disease in female 04/09/2012  . DOE (dyspnea on exertion) 08/03/2011  . Anxiety state 10/19/2010  . CHEST PAIN 11/22/2008  . Sarcoidosis 06/21/2008    Past Surgical History:  Procedure Laterality Date  . BREAST LUMPECTOMY  01/12/2011   right  . TUBAL LIGATION      OB History    Gravida Para Term Preterm AB Living   1 1 1          SAB TAB Ectopic Multiple Live Births                   Home Medications    Prior to Admission medications   Medication Sig Start Date End Date Taking? Authorizing Provider  acetaminophen (TYLENOL) 325 MG tablet Take 2 tablets (650 mg total) by mouth every 6 (six) hours as needed for mild pain (or Fever >/= 101). 08/21/16  Yes Corey Harold, NP  ALPRAZolam Duanne Moron) 1 MG tablet Take 1 mg by mouth 3 (three) times daily as needed for anxiety. 12/19/16  Yes [provider]  budesonide (PULMICORT) 0.5 MG/2ML  nebulizer solution Take 2 mLs (0.5 mg total) by nebulization daily as needed (for shortness of breath). 08/21/16 11/16/17 Yes Corey Harold, NP  ferrous sulfate 325 (65 FE) MG EC tablet Take 325 mg by mouth daily with breakfast.   Yes [provider]  HYDROcodone-acetaminophen (NORCO/VICODIN) 5-325 MG tablet Take 1 tablet by mouth 2 (two) times daily as needed. 02/07/17  Yes [name], MD  Levothyroxine Sodium 50 MCG CAPS Take 1 capsule (50 mcg total) by mouth daily before breakfast. 01/15/17  Yes Nida, Marella Chimes, MD  midodrine (PROAMATINE) 5 MG tablet Take 1 tablet (5 mg total) by mouth 2 (two) times  daily with a meal. 02/07/17  Yes Luking, Elayne Snare, MD  pantoprazole (PROTONIX) 40 MG tablet TAKE 1 TABLET BY MOUTH ONCE DAILY 02/20/17  Yes [name], MD  predniSONE (DELTASONE) 10 MG tablet Take 1 tablet (10 mg total) by mouth daily with breakfast. 10/16/16  Yes Nida, Marella Chimes, MD  sertraline (ZOLOFT) 100 MG tablet Take 1 tablet (100 mg total) by mouth daily. 10/04/16  Yes Cloria Spring, MD  albuterol (PROVENTIL) (2.5 MG/3ML) 0.083% nebulizer solution Take 3 mLs (2.5 mg total) by nebulization every 2 (two) hours as needed for wheezing or shortness of breath. Patient not taking: Reported on 03/19/2017 02/07/17   [name], MD  traZODone (DESYREL) 50 MG tablet Take 1 tablet (50 mg total) by mouth at bedtime. Patient not taking: Reported on 03/19/2017 10/04/16   Cloria Spring, MD    Family History Family History  Problem Relation Age of Onset  . Cardiomyopathy Mother   . Breast cancer Mother   . Prostate cancer Father   . Neurofibromatosis Brother   . Heart disease Maternal Aunt   . Bipolar disorder Daughter   . Colon cancer Neg Hx     Social History Social History  Substance Use Topics  . Smoking status: Never Smoker  . Smokeless tobacco: Never Used  . Alcohol use No     Allergies   Bee venom; Other; Codeine; and Morphine and related   Review of Systems Review of Systems  Constitutional: Negative for appetite change, chills and fever.  HENT: Negative for ear pain, rhinorrhea, sneezing and sore throat.   Eyes: Negative for photophobia and visual disturbance.  Respiratory: Negative for cough, chest tightness, shortness of breath and wheezing.   Cardiovascular: Negative for chest pain and palpitations.  Gastrointestinal: Negative for abdominal pain, nausea and vomiting.  Genitourinary: Negative for dysuria, frequency, hematuria and urgency.  Musculoskeletal: Positive for arthralgias and back pain. Negative for myalgias.  Skin: Negative for rash.  Neurological:  Negative for dizziness, weakness, light-headedness and headaches.     Physical Exam Updated Vital Signs BP (!) 114/58 (BP Location: Left Arm)   Pulse 63   Temp 97.8 F (36.6 C) (Oral)   Resp 20   Ht 5\' 10"  (1.778 m)   Wt 93 kg (205 lb)   SpO2 97%   BMI 29.41 kg/m   Physical Exam  Constitutional: She appears well-developed and well-nourished. No distress.  HENT:  Head: Normocephalic and atraumatic.  Nose: Nose normal.  Eyes: Conjunctivae and EOM are normal. Left eye exhibits no discharge. No scleral icterus.  Neck: Normal range of motion. Neck supple.  Cardiovascular: Normal rate, regular rhythm, normal heart sounds and intact distal pulses.  Exam reveals no gallop and no friction rub.   No murmur heard. Pulmonary/Chest: Effort normal and breath sounds normal. No respiratory distress.  Abdominal: Soft. Bowel sounds are normal. She exhibits no distension. There is no tenderness. There is no guarding.  Musculoskeletal: She exhibits tenderness. She exhibits no edema.  Tenderness to palpation of bilateral hips. There is no midline lumbar spinal tenderness present as well as paraspinal musculature tenderness. Tenderness to palpation of bilateral ankles. No visible deformity, bruising or color change noted in any areas. No midline spinal tenderness present in thoracic or cervical spine. No step-off palpated. No visible bruising, edema or temperature change noted. No objective signs of numbness present. No saddle anesthesia. 2+ DP pulses bilaterally. Sensation intact to light touch. Strength 5/5 in bilateral lower extremities.   Neurological: She is alert. She exhibits normal muscle tone. Coordination normal.  Skin: Skin is warm and dry. No rash noted.  Psychiatric: She has a normal mood and affect.  Nursing note and vitals reviewed.    ED Treatments / Results  Labs (all labs ordered are listed, but only abnormal results are displayed) Labs Reviewed - No data to display  EKG  EKG  Interpretation None       Radiology Dg Lumbar Spine Complete  Result Date: 03/19/2017 CLINICAL DATA:  Recent fall with low back pain, initial encounter EXAM: LUMBAR SPINE - COMPLETE 4+ VIEW COMPARISON:  None. FINDINGS: Five lumbar type vertebral bodies are well visualized. Vertebral body height is well maintained. Mild disc space narrowing is noted at L4-5 and L5-S1. No anterolisthesis is seen. No pars defects are noted. No soft tissue changes are seen. IMPRESSION: Mild degenerative change without acute abnormality. Electronically Signed   By: Inez Catalina M.D.   On: 03/19/2017 18:08   Dg Ankle Complete Left  Result Date: 03/19/2017 CLINICAL DATA:  Recent slip and fall with left ankle pain, initial encounter EXAM: LEFT ANKLE COMPLETE - 3+ VIEW COMPARISON:  None. FINDINGS: There is no evidence of fracture, dislocation, or joint effusion. There is no evidence of arthropathy or other focal bone abnormality. Soft tissues are unremarkable. IMPRESSION: No acute abnormality noted. Electronically Signed   By: Inez Catalina M.D.   On: 03/19/2017 18:10   Dg Ankle Complete Right  Result Date: 03/19/2017 CLINICAL DATA:  Right ankle pain following fall earlier today, initial encounter EXAM: RIGHT ANKLE - COMPLETE 3+ VIEW COMPARISON:  03/15/2015 FINDINGS: Previously seen distal fibular fracture has healed without complicating factors. No soft tissue changes are seen. No acute bony abnormality is noted. A small calcaneal spur is seen. IMPRESSION: No acute abnormality noted. Electronically Signed   By: Inez Catalina M.D.   On: 03/19/2017 18:09   Dg Hips Bilat W Or Wo Pelvis 3-4 Views  Result Date: 03/19/2017 CLINICAL DATA:  Left greater than right hip pain following fall, initial encounter EXAM: DG HIP (WITH OR WITHOUT PELVIS) 3-4V BILAT COMPARISON:  None. FINDINGS: The pelvic ring is intact. Mild degenerative changes of the hip joints are noted bilaterally. There are changes in the left femoral head of increased  sclerosis somewhat suggestive of avascular necrosis. No acute fracture is noted. No soft tissue changes are seen. IMPRESSION: No acute abnormality is noted. There changes suggestive of avascular necrosis of the left femoral head. Nonemergent MRI may be helpful for further evaluation as clinically indicated. Electronically Signed   By: Inez Catalina M.D.   On: 03/19/2017 18:07    Procedures Procedures (including critical care time)  Medications Ordered in ED Medications - No data to display   Initial Impression / Assessment and Plan / ED Course  I have reviewed the triage  vital signs and the nursing notes.  Pertinent labs & imaging results that were available during my care of the patient were reviewed by me and considered in my medical decision making (see chart for details).     Patient presents to ED for evaluation of lower back pain, bilateral hip pain and foot pain that occurred after mechanical fall prior to arrival. She states that she's been ambulatory since the incident but is continuing to have pain in the above-mentioned areas. She has a history of avascular necrosis on the left hip due to chronic prednisone use due to her lung disease. She reports a tingling sensation in both legs that has improved since being here in the ED. She denies any head injury or loss of consciousness. She denies any urinary incontinence, numbness, weakness, prior back surgery, history of cancer, history of IV drug use. She has some midline lumbar spinal tenderness present noted as well as bilateral hip tenderness to palpation. She has full active and passive range of motion of all joints. X-rays of bilateral hips, bilateral ankles and lumbar spine were negative. Hip x-ray showed previously mentioned avascular necrosis on the left side. We'll advise patient to continue taking anti-inflammatories for pain, apply heat or ice to affected area and stretch as tolerated. Weightbearing as tolerated. Patient appears  stable for discharge at this time. Strict return precautions given.  Final Clinical Impressions(s) / ED Diagnoses   Final diagnoses:  Fall, initial encounter  Pain of both hip joints    New Prescriptions New Prescriptions   No medications on file     Delia Heady, Hershal Coria 03/19/17 1828    Tegeler, Gwenyth Allegra, MD 03/20/17 [phone]

## 2017-03-21 ENCOUNTER — Encounter: Payer: Self-pay | Admitting: Family Medicine

## 2017-03-21 ENCOUNTER — Ambulatory Visit (INDEPENDENT_AMBULATORY_CARE_PROVIDER_SITE_OTHER): Payer: 59 | Admitting: Family Medicine

## 2017-03-21 VITALS — BP 118/82 | Ht 70.0 in | Wt 216.1 lb

## 2017-03-21 DIAGNOSIS — S7002XD Contusion of left hip, subsequent encounter: Secondary | ICD-10-CM | POA: Diagnosis not present

## 2017-03-21 MED ORDER — OXYCODONE-ACETAMINOPHEN 5-325 MG PO TABS: 1.0000 | ORAL_TABLET | Freq: Three times a day (TID) | ORAL | 0 refills | Status: DC | PRN

## 2017-03-21 NOTE — Progress Notes (Signed)
   Subjective:    Patient ID: Kathleen [name], female    DOB: [birth year], 56 y.o.   MRN: [id number]  HPI: Patient comes in today for a follow up on recent hospitalization due to a fall. States she fell this past Tuesday and then went to the hospital, they did xrays on her,and per pt they showed nothing broken or fractured. She states she is sore all over at this time. Patient slipped at the nursing home on a wet floor, EMS took her to the hospital No other concerns  ER record reviewed in detail relates a lot of stiffness and soreness in her hips and legs  Review of Systems Denies any chest pressure tightness vomiting diarrhea fever chills sweats    Objective:   Physical Exam  Lungs clear heart regular pulse normal patient walks with a mild limp fair range of motion in hips knees ankles and wrists bruising on the left lower leg noted    Assessment & Plan:  Multiple contusions from recent fall Avascular necrosis of the left hip Heard aching and stiffness should gradually get better over the course of the next couple weeks Oxycodone for severe pain over the next few days then afterwards may revert back to her hydrocodone

## 2017-03-21 NOTE — Patient Instructions (Signed)
Use cane with walking

## 2017-03-27 MED FILL — OXYCODONE-ACETAMINOPHEN 5-3: 5-325 | 6 days supply | Qty: 20 | Fill #0

## 2017-04-08 ENCOUNTER — Ambulatory Visit (INDEPENDENT_AMBULATORY_CARE_PROVIDER_SITE_OTHER): Payer: 59 | Admitting: Family Medicine

## 2017-04-08 ENCOUNTER — Encounter: Payer: Self-pay | Admitting: Family Medicine

## 2017-04-08 VITALS — BP 128/84 | Ht 70.0 in | Wt 220.1 lb

## 2017-04-08 DIAGNOSIS — G609 Hereditary and idiopathic neuropathy, unspecified: Secondary | ICD-10-CM | POA: Diagnosis not present

## 2017-04-08 MED ORDER — OXYCODONE-ACETAMINOPHEN 5-325 MG PO TABS: ORAL_TABLET | ORAL | 0 refills | Status: DC

## 2017-04-08 NOTE — Progress Notes (Addendum)
   Subjective:    Patient ID: Kathleen [name], female    DOB: 1961-07-06, 56 y.o.   MRN: [id number]  Leg Pain   Associated symptoms include numbness.  Patient reports she fell July 24, has been seen by [place] after the fall,but bilateral leg pain has gotten worse. The left has a knot/swollen near her ankle. No other concerns. Patient states she has soreness in the lower leg on the lateral aspect denies calf pain no swelling in the lower leg no sign of DVT Patient using walker to get around Patient frustrated by her troubles  Having bilateral leg numbness that comes and goes last anywhere from 30 minutes to several hours at a time lower leg legs will go numb makes it difficult for her to walk denies any numbness in the arms Review of Systems  Constitutional: Negative for activity change and appetite change.  Gastrointestinal: Negative for abdominal pain and vomiting.  Neurological: Positive for numbness. Negative for weakness.  Psychiatric/Behavioral: Negative for confusion.       Objective:   Physical Exam  Constitutional: She appears well-nourished. No distress.  HENT:  Head: Normocephalic.  Cardiovascular: Normal rate, regular rhythm and normal heart sounds.   No murmur heard. Pulmonary/Chest: Effort normal and breath sounds normal.  Musculoskeletal: She exhibits no edema.  Lymphadenopathy:    She has no cervical adenopathy.  Neurological: She is alert.  Psychiatric: Her behavior is normal.  Vitals reviewed.  Strength seems to be okay she does walk with a walker Subjectively not having neuropathy issues right at the moment      Assessment & Plan:  Intermittent neuropathy lower legs I do believe it's reasonable for her to see neurology for nerve conduction studies Oxycodone being used for pain caution drowsiness Follow-up office visit within 30 days to recheck progress I do not find evidence of a DVT in the left leg  Patient also having weakness in the leg difficulty  walking as well as ataxia Face-to-face evaluation was done for this patient It was determined that physical therapy would be the most efficacious way of helping her with her pain and helping rehabilitate her in strengthening her to prevent falls Referral to home health/physical therapy.

## 2017-04-09 ENCOUNTER — Encounter: Payer: Self-pay | Admitting: Family Medicine

## 2017-04-09 MED FILL — OXYCODONE-ACETAMINOPHEN 5-3: 5-325 | 30 days supply | Qty: 60 | Fill #0

## 2017-04-10 ENCOUNTER — Ambulatory Visit (HOSPITAL_COMMUNITY): Payer: 59 | Admitting: Psychiatry

## 2017-04-12 ENCOUNTER — Ambulatory Visit (INDEPENDENT_AMBULATORY_CARE_PROVIDER_SITE_OTHER): Payer: 59 | Admitting: Psychiatry

## 2017-04-12 ENCOUNTER — Encounter (HOSPITAL_COMMUNITY): Payer: Self-pay | Admitting: Psychiatry

## 2017-04-12 ENCOUNTER — Telehealth: Payer: Self-pay | Admitting: Family Medicine

## 2017-04-12 VITALS — BP 118/71 | HR 86 | Ht 70.0 in | Wt 219.0 lb

## 2017-04-12 DIAGNOSIS — M791 Myalgia: Secondary | ICD-10-CM

## 2017-04-12 DIAGNOSIS — F411 Generalized anxiety disorder: Secondary | ICD-10-CM | POA: Diagnosis not present

## 2017-04-12 DIAGNOSIS — F332 Major depressive disorder, recurrent severe without psychotic features: Secondary | ICD-10-CM | POA: Diagnosis not present

## 2017-04-12 DIAGNOSIS — R0602 Shortness of breath: Secondary | ICD-10-CM

## 2017-04-12 DIAGNOSIS — D869 Sarcoidosis, unspecified: Secondary | ICD-10-CM | POA: Diagnosis not present

## 2017-04-12 DIAGNOSIS — R5383 Other fatigue: Secondary | ICD-10-CM | POA: Diagnosis not present

## 2017-04-12 DIAGNOSIS — Z736 Limitation of activities due to disability: Secondary | ICD-10-CM

## 2017-04-12 DIAGNOSIS — R270 Ataxia, unspecified: Secondary | ICD-10-CM

## 2017-04-12 DIAGNOSIS — R296 Repeated falls: Secondary | ICD-10-CM

## 2017-04-12 DIAGNOSIS — M79606 Pain in leg, unspecified: Secondary | ICD-10-CM

## 2017-04-12 MED ORDER — SERTRALINE HCL 50 MG PO TABS: 50.0000 mg | ORAL_TABLET | Freq: Every day | ORAL | 2 refills | Status: DC

## 2017-04-12 MED FILL — predniSONE 10 MG TABS: 10 | 30 days supply | Qty: 30 | Fill #5

## 2017-04-12 MED FILL — SERTRALINE HCL 50 MG TABLET: 50 | 30 days supply | Qty: 30 | Fill #0

## 2017-04-12 MED FILL — MIDODRINE HCL 5 MG TABLET: 5 | 30 days supply | Qty: 60 | Fill #4

## 2017-04-12 NOTE — Telephone Encounter (Signed)
Pt called to state that Dr. Nicki Reaper was going to refer her for home health PT due to a recent fall  Nothing noted in last OV - please advise, initiate referral in system so that I may process  Also will need specific face-to-face documentation for home health order  Pt wants [facility] due to she's had their services before

## 2017-04-12 NOTE — Telephone Encounter (Signed)
Physical Therapy consultation due to falling, ataxia, leg pain from injury
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
## 2017-04-12 NOTE — Telephone Encounter (Signed)
Referral made 

## 2017-04-19 ENCOUNTER — Other Ambulatory Visit: Payer: Self-pay | Admitting: "Endocrinology

## 2017-04-19 ENCOUNTER — Ambulatory Visit: Payer: 59 | Admitting: "Endocrinology

## 2017-04-19 DIAGNOSIS — E2740 Unspecified adrenocortical insufficiency: Secondary | ICD-10-CM | POA: Diagnosis not present

## 2017-04-19 NOTE — Telephone Encounter (Signed)
Please add to last OV note for home health PT order, please see note below from [place] (documentation needed per insurance)  (Please advise when done so I may notify Vaughan Basta)  Lajuana Ripple, Donnajean Lopes, [place]  If he could add a note stating a referral for [facility] PT will be made  And his usual note for [id number]  I will send in the telephone encounter note and see if they will accept for a order

## 2017-04-20 LAB — COMPREHENSIVE METABOLIC PANEL
ALT: 15 IU/L (ref 0–32)
AST: 15 IU/L (ref 0–40)
Albumin/Globulin Ratio: 1.7 (ref 1.2–2.2)
Albumin: 4.2 g/dL (ref 3.5–5.5)
Alkaline Phosphatase: 75 IU/L (ref 39–117)
BUN/Creatinine Ratio: 13 (ref 9–23)
BUN: 12 mg/dL (ref 6–24)
Bilirubin Total: 0.3 mg/dL (ref 0.0–1.2)
CO2: 27 mmol/L (ref 20–29)
Calcium: 9.9 mg/dL (ref 8.7–10.2)
Chloride: 100 mmol/L (ref 96–106)
Creatinine, Ser: 0.91 mg/dL (ref 0.57–1.00)
GFR calc Af Amer: 83 mL/min/{1.73_m2} (ref >59)
GFR calc non Af Amer: 72 mL/min/{1.73_m2} (ref >59)
Globulin, Total: 2.5 g/dL (ref 1.5–4.5)
Glucose: 72 mg/dL (ref 65–99)
Potassium: 4.8 mmol/L (ref 3.5–5.2)
Sodium: 142 mmol/L (ref 134–144)
Total Protein: 6.7 g/dL (ref 6.0–8.5)

## 2017-04-20 LAB — TSH: TSH: 5.19 u[IU]/mL — ABNORMAL HIGH (ref 0.450–4.500)

## 2017-04-20 LAB — T4, FREE: Free T4: 1.15 ng/dL (ref 0.82–1.77)

## 2017-04-20 LAB — AMBIG ABBREV CMP14 DEFAULT

## 2017-04-22 ENCOUNTER — Telehealth: Payer: Self-pay | Admitting: Family Medicine

## 2017-04-22 NOTE — Telephone Encounter (Signed)
Patient said that she had labs done in May 2018 and November 2017.  She said she received a bill for these services because they weren't considered medically necessary.  She wants to know if there is anything that Dr. Nicki Reaper can do for her.

## 2017-04-23 NOTE — Telephone Encounter (Signed)
Addendum was completed

## 2017-04-26 ENCOUNTER — Encounter: Payer: Self-pay | Admitting: "Endocrinology

## 2017-04-26 ENCOUNTER — Ambulatory Visit (INDEPENDENT_AMBULATORY_CARE_PROVIDER_SITE_OTHER): Payer: 59 | Admitting: "Endocrinology

## 2017-04-26 VITALS — BP 112/79 | HR 88 | Ht 70.0 in | Wt 222.0 lb

## 2017-04-26 DIAGNOSIS — E039 Hypothyroidism, unspecified: Secondary | ICD-10-CM | POA: Diagnosis not present

## 2017-04-26 DIAGNOSIS — Z9119 Patient's noncompliance with other medical treatment and regimen: Secondary | ICD-10-CM

## 2017-04-26 DIAGNOSIS — E2740 Unspecified adrenocortical insufficiency: Secondary | ICD-10-CM | POA: Diagnosis not present

## 2017-04-26 DIAGNOSIS — G4733 Obstructive sleep apnea (adult) (pediatric): Secondary | ICD-10-CM | POA: Diagnosis not present

## 2017-04-26 DIAGNOSIS — F329 Major depressive disorder, single episode, unspecified: Secondary | ICD-10-CM | POA: Diagnosis not present

## 2017-04-26 DIAGNOSIS — G629 Polyneuropathy, unspecified: Secondary | ICD-10-CM | POA: Diagnosis not present

## 2017-04-26 DIAGNOSIS — M79605 Pain in left leg: Secondary | ICD-10-CM | POA: Diagnosis not present

## 2017-04-26 DIAGNOSIS — M79604 Pain in right leg: Secondary | ICD-10-CM | POA: Diagnosis not present

## 2017-04-26 DIAGNOSIS — Z91199 Patient's noncompliance with other medical treatment and regimen due to unspecified reason: Secondary | ICD-10-CM | POA: Insufficient documentation

## 2017-04-26 DIAGNOSIS — Z7952 Long term (current) use of systemic steroids: Secondary | ICD-10-CM | POA: Diagnosis not present

## 2017-04-26 DIAGNOSIS — R531 Weakness: Secondary | ICD-10-CM | POA: Diagnosis not present

## 2017-04-26 DIAGNOSIS — G894 Chronic pain syndrome: Secondary | ICD-10-CM | POA: Diagnosis not present

## 2017-04-26 DIAGNOSIS — Z9181 History of falling: Secondary | ICD-10-CM | POA: Diagnosis not present

## 2017-04-26 DIAGNOSIS — M797 Fibromyalgia: Secondary | ICD-10-CM | POA: Diagnosis not present

## 2017-04-26 DIAGNOSIS — R270 Ataxia, unspecified: Secondary | ICD-10-CM | POA: Diagnosis not present

## 2017-04-26 MED ORDER — LEVOTHYROXINE SODIUM 88 MCG PO TABS: 88.0000 ug | ORAL_TABLET | Freq: Every day | ORAL | 3 refills | Status: DC

## 2017-04-26 MED ORDER — LEVOTHYROXINE SODIUM 75 MCG PO TABS: 75.0000 ug | ORAL_TABLET | Freq: Every day | ORAL | 3 refills | Status: DC

## 2017-04-26 NOTE — Progress Notes (Signed)
Subjective:    Patient ID: Kathleen [name], female    DOB: Jan 19, 1961, PCP [name], MD   Past Medical History:  Diagnosis Date  . Anemia   . Avascular bone necrosis ([place])   . Breast fibrocystic disorder   . Fibromyalgia   . Gastroesophageal reflux disease   . Hypertension   . Mitral valve prolapse   . Restrictive lung disease    Moderate to severe  . Sarcoidosis    Biopsy proven - [facility]   Past Surgical History:  Procedure Laterality Date  . BREAST LUMPECTOMY  01/12/2011   right  . TUBAL LIGATION     Social History   Social History  . Marital status: Married    Spouse name: N/A  . Number of children: 1  . Years of education: N/A   Occupational History  . Accounts Payable [employer]   Social History Main Topics  . Smoking status: Never Smoker  . Smokeless tobacco: Never Used  . Alcohol use No  . Drug use: No  . Sexual activity: Yes   Other Topics Concern  . None   Social History Narrative   Daily caffeine    Outpatient Encounter Prescriptions as of 04/26/2017  Medication Sig  . acetaminophen (TYLENOL) 325 MG tablet Take 2 tablets (650 mg total) by mouth every 6 (six) hours as needed for mild pain (or Fever >/= 101).  Marland Kitchen albuterol (PROVENTIL) (2.5 MG/3ML) 0.083% nebulizer solution Take 3 mLs (2.5 mg total) by nebulization every 2 (two) hours as needed for wheezing or shortness of breath.  . budesonide (PULMICORT) 0.5 MG/2ML nebulizer solution Take 2 mLs (0.5 mg total) by nebulization daily as needed (for shortness of breath).  . ferrous sulfate 325 (65 FE) MG EC tablet Take 325 mg by mouth daily with breakfast.  . levothyroxine (SYNTHROID) 75 MCG tablet Take 1 tablet (75 mcg total) by mouth daily before breakfast.  . midodrine (PROAMATINE) 5 MG tablet Take 1 tablet (5 mg total) by mouth 2 (two) times daily with a meal.  . oxyCODONE-acetaminophen (ROXICET) 5-325 MG tablet 1 bid prn pain  . pantoprazole (PROTONIX) 40 MG tablet TAKE 1 TABLET BY MOUTH ONCE  DAILY  . predniSONE (DELTASONE) 10 MG tablet Take 1 tablet (10 mg total) by mouth daily with breakfast.  . sertraline (ZOLOFT) 50 MG tablet Take 1 tablet (50 mg total) by mouth daily.  . [DISCONTINUED] levothyroxine (SYNTHROID, LEVOTHROID) 88 MCG tablet Take 1 tablet (88 mcg total) by mouth daily before breakfast.  . [DISCONTINUED] Levothyroxine Sodium 50 MCG CAPS Take 1 capsule (50 mcg total) by mouth daily before breakfast.   No facility-administered encounter medications on file as of 04/26/2017.    ALLERGIES: Allergies  Allergen Reactions  . Bee Venom Anaphylaxis  . Other Other (See Comments)    Contrast Dye CT chest w/ contrast at OSH followed by SOB resolved w/ single dose steroids, never ENT swelling, intubation or pressors, has had multiple contrasted scans since  . Codeine Itching    Other reaction(s): ITCHING  . Morphine And Related Other (See Comments)    Unknown reaction- pt gets sick, dizzy, and confused Intolerance, not an allergy, received this in the emergency department recently without a problem.    VACCINATION STATUS: Immunization History  Administered Date(s) Administered  . Influenza Whole 06/27/2010, 07/28/2011  . Pneumococcal Conjugate-13 06/15/2014  . Pneumococcal Polysaccharide-23 06/10/2013    HPI   56 yr old female patient with medical history as above. She  is being seen in f/u for history of adrenal insufficiency requested by Dr. Wolfgang Phoenix. - Her history is complicated including sarcoidosis which she dealt with for the last 10 years. Her therapy included high-dose steroids, up to 60 mg daily of prednisone, for the last 4-6 years. - Recently,   she was hospitalized for what appears to be septic shock. She required mechanical ventilation for respiratory failure and was transferred to [facility] for pulmonary care.  - During her last visit, I have increased her prednisone to 10 mg by mouth daily. She continued to feel better, no hospitalization in the interim. - She  was also recently diagnosed with hypothyroidism and was initiated on levothyroxine 50 g by mouth every morning. - She has not been consistent taking this medication. - She reports weight gain, approximately 9 pounds since her last visit. - She admits to dietary indiscretion including consumption of large quantities of sweet tea. - She denies interrupting her steroids replacement. - The details of her diagnosis with adrenal insufficiency  is not available to review. On 08/22/2016 her cortisol level was 24.6, however at that time she was on prednisone already. -She does not wear medic alert. She denies any history of tuberculosis, incarceration, nor overseas travel . -She denies family history of adrenal, pituitary dysfunction. She has family history of hypothyroidism in one sibling. - Denies any history of intra-abdominal injury. She is on hydrocodone for chronic pain and inhaled bronchodilators. She is known to have orthostatic hypotension for which she is on midodrine.   Review of Systems  Constitutional: She has gained 9 pounds since last visit, + fatigue, no subjective hyperthermia, no subjective hypothermia Eyes: no blurry vision, no xerophthalmia ENT: no sore throat, no nodules palpated in throat, no dysphagia/odynophagia, no hoarseness Cardiovascular: no Chest Pain, no Shortness of Breath, no palpitations, no leg swelling Respiratory:  + Intermittent nonproductive cough, no SOB Gastrointestinal: no Nausea/Vomiting/Diarhhea Musculoskeletal: no muscle/joint aches Skin: no rashes Neurological: no tremors, no numbness, no tingling, no dizziness Psychiatric: no depression, no anxiety  Objective:    BP 112/79   Pulse 88   Ht 5\' 10"  (1.778 m)   Wt 222 lb (100.7 kg)   BMI 31.85 kg/m   Wt Readings from Last 3 Encounters:  04/26/17 222 lb (100.7 kg)  04/12/17 219 lb (99.3 kg)  04/08/17 220 lb 0.8 oz (99.8 kg)    Physical Exam  Constitutional:  + Obese, not in acute distress,  normal state of mind Eyes: PERRLA, EOMI, no exophthalmos ENT: moist mucous membranes, no thyromegaly, no cervical lymphadenopathy Cardiovascular: normal precordial activity, Regular Rate and Rhythm, no Murmur/Rubs/Gallops Respiratory:  adequate breathing efforts, no gross chest deformity, Clear to auscultation bilaterally Gastrointestinal: abdomen soft, Non -tender, No distension, Bowel Sounds present Musculoskeletal: no gross deformities, strength intact in all four extremities Skin: moist, warm, no rashes Neurological: no tremor with outstretched hands, Deep tendon reflexes normal in all four extremities.  CMP     Component Value Date/Time   NA 142 04/19/2017 0842   K 4.8 04/19/2017 0842   CL 100 04/19/2017 0842   CO2 27 04/19/2017 0842   GLUCOSE 72 04/19/2017 0842   GLUCOSE 205 (H) 08/21/2016 1620   BUN 12 04/19/2017 0842   CREATININE 0.91 04/19/2017 0842   CREATININE 0.85 08/26/2014 0831   CALCIUM 9.9 04/19/2017 0842   PROT 6.7 04/19/2017 0842   ALBUMIN 4.2 04/19/2017 0842   AST 15 04/19/2017 0842   ALT 15 04/19/2017 0842   ALKPHOS 75 04/19/2017 0842  BILITOT 0.3 04/19/2017 0842   GFRNONAA 72 04/19/2017 0842   GFRAA 83 04/19/2017 0842     Diabetic Labs (most recent): Lab Results  Component Value Date   HGBA1C 5.0 12/31/2016   HGBA1C 5.3 08/21/2016    Lipid Panel     Component Value Date/Time   CHOL 221 (H) 07/27/2016 1046   TRIG 193 (H) 07/27/2016 1046   HDL 49 07/27/2016 1046   CHOLHDL 4.5 (H) 07/27/2016 1046   CHOLHDL 4.0 08/26/2014 0831   VLDL 31 08/26/2014 0831   LDLCALC 133 (H) 07/27/2016 1046    Results for KANIYA, TRUEHEART (MRN [id number]) as of 04/26/2017 08:24  Ref. Range 12/31/2016 08:17 04/19/2017 08:42  TSH Latest Ref Range: 0.450 - 4.500 uIU/mL 4.950 (H) 5.190 (H)  T4,Free(Direct) Latest Ref Range: 0.82 - 1.77 ng/dL 0.99 1.15    Assessment & Plan:   1. Adrenal insufficiency ([place]) -I have reviewed her available records and clinically evaluated  this patient. She does have multiple risk factors for adrenal insufficiency including exposure to high-dose steroids related to her sarcoidosis. - She has taken prednisone up to 60 mg for 4-6 years prior to her diagnosis with adrenal insufficiency. - She did better on current dose of prednisone 10 mg by mouth every morning.  - At this time, it would be unsafe to withdraw steroids to do tests to specify  etiology of adrenal insufficiency such as Addison's disease. -Given her recent hospitalization for septic shock, I decided to keep replacing her until she becomes stable. I advised her to wear medical alert depicting her adrenal insufficiency and need for daily steroids likely for life. - She will be evaluated for the need for mineralocorticoid replacement which may help to stabilize her blood pressure. - I had a long discussion with her regarding steroids sick day rules, risk of weight gain/diabetes and the need for periodic screening with A1c. -Her last A1c was 5.3% indicating absence of prediabetes/diabetes.  2. Hypothyroidism: Recent diagnosis - She has not been consistent taking her levothyroxine 50 g by mouth every morning. - She will likely require an benefit from a slightly higher dose. I'll prescribe levothyroxine 75 g by mouth every morning.  - We discussed about correct intake of levothyroxine, at fasting, with water, separated by at least 30 minutes from breakfast, and separated by more than 4 hours from calcium, iron, multivitamins, acid reflux medications (PPIs). -Patient is made aware of the fact that thyroid hormone replacement is needed for life, dose to be adjusted by periodic monitoring of thyroid function tests.  - I advised patient to maintain close follow up with [name], MD for primary care needs. Follow up plan: Return in about 3 months (around 07/26/2017) for follow up with pre-visit labs.  Glade Lloyd, MD Phone: [phone]  Fax: [phone]  This note was  partially dictated with voice recognition software. Similar sounding words can be transcribed inadequately or may not  be corrected upon review.  04/26/2017, 8:36 AM

## 2017-04-29 DIAGNOSIS — G4733 Obstructive sleep apnea (adult) (pediatric): Secondary | ICD-10-CM | POA: Diagnosis not present

## 2017-04-29 DIAGNOSIS — M79605 Pain in left leg: Secondary | ICD-10-CM | POA: Diagnosis not present

## 2017-04-29 DIAGNOSIS — G894 Chronic pain syndrome: Secondary | ICD-10-CM | POA: Diagnosis not present

## 2017-04-29 DIAGNOSIS — R531 Weakness: Secondary | ICD-10-CM | POA: Diagnosis not present

## 2017-04-29 DIAGNOSIS — M797 Fibromyalgia: Secondary | ICD-10-CM | POA: Diagnosis not present

## 2017-04-29 DIAGNOSIS — F329 Major depressive disorder, single episode, unspecified: Secondary | ICD-10-CM | POA: Diagnosis not present

## 2017-04-29 DIAGNOSIS — M79604 Pain in right leg: Secondary | ICD-10-CM | POA: Diagnosis not present

## 2017-04-29 DIAGNOSIS — G629 Polyneuropathy, unspecified: Secondary | ICD-10-CM | POA: Diagnosis not present

## 2017-04-29 DIAGNOSIS — R270 Ataxia, unspecified: Secondary | ICD-10-CM | POA: Diagnosis not present

## 2017-05-01 ENCOUNTER — Encounter: Payer: Self-pay | Admitting: Family Medicine

## 2017-05-01 MED FILL — LEVOTHYROXINE 75 MCG TABLET: 75 | 30 days supply | Qty: 30 | Fill #0

## 2017-05-02 ENCOUNTER — Encounter: Payer: Self-pay | Admitting: Neurology

## 2017-05-03 DIAGNOSIS — R531 Weakness: Secondary | ICD-10-CM | POA: Diagnosis not present

## 2017-05-03 DIAGNOSIS — G629 Polyneuropathy, unspecified: Secondary | ICD-10-CM | POA: Diagnosis not present

## 2017-05-03 DIAGNOSIS — G4733 Obstructive sleep apnea (adult) (pediatric): Secondary | ICD-10-CM | POA: Diagnosis not present

## 2017-05-03 DIAGNOSIS — R270 Ataxia, unspecified: Secondary | ICD-10-CM | POA: Diagnosis not present

## 2017-05-03 DIAGNOSIS — M79605 Pain in left leg: Secondary | ICD-10-CM | POA: Diagnosis not present

## 2017-05-03 DIAGNOSIS — M79604 Pain in right leg: Secondary | ICD-10-CM | POA: Diagnosis not present

## 2017-05-03 DIAGNOSIS — M797 Fibromyalgia: Secondary | ICD-10-CM | POA: Diagnosis not present

## 2017-05-03 DIAGNOSIS — F329 Major depressive disorder, single episode, unspecified: Secondary | ICD-10-CM | POA: Diagnosis not present

## 2017-05-03 DIAGNOSIS — G894 Chronic pain syndrome: Secondary | ICD-10-CM | POA: Diagnosis not present

## 2017-05-04 ENCOUNTER — Emergency Department (HOSPITAL_COMMUNITY)
Admission: EM | Admit: 2017-05-04 | Discharge: 2017-05-04 | Disposition: A | Discharge status: Home / Self Care | Payer: 59 | Attending: Emergency Medicine | Admitting: Emergency Medicine

## 2017-05-04 DIAGNOSIS — R112 Nausea with vomiting, unspecified: Secondary | ICD-10-CM | POA: Insufficient documentation

## 2017-05-04 DIAGNOSIS — R197 Diarrhea, unspecified: Secondary | ICD-10-CM | POA: Insufficient documentation

## 2017-05-04 DIAGNOSIS — Z5321 Procedure and treatment not carried out due to patient leaving prior to being seen by health care provider: Secondary | ICD-10-CM | POA: Insufficient documentation

## 2017-05-04 LAB — COMPREHENSIVE METABOLIC PANEL
ALT: 18 U/L (ref 14–54)
AST: 39 U/L (ref 15–41)
Albumin: 3.7 g/dL (ref 3.5–5.0)
Alkaline Phosphatase: 60 U/L (ref 38–126)
Anion gap: 8 (ref 5–15)
BUN: 12 mg/dL (ref 6–20)
CO2: 25 mmol/L (ref 22–32)
Calcium: 9.4 mg/dL (ref 8.9–10.3)
Chloride: 102 mmol/L (ref 101–111)
Creatinine, Ser: 0.99 mg/dL (ref 0.44–1.00)
GFR calc Af Amer: >60 mL/min (ref >60)
GFR calc non Af Amer: >60 mL/min (ref >60)
Glucose, Bld: 108 mg/dL — ABNORMAL HIGH (ref 65–99)
Potassium: 5 mmol/L (ref 3.5–5.1)
Sodium: 135 mmol/L (ref 135–145)
Total Bilirubin: 1.3 mg/dL — ABNORMAL HIGH (ref 0.3–1.2)
Total Protein: 7.1 g/dL (ref 6.5–8.1)

## 2017-05-04 LAB — URINALYSIS, ROUTINE W REFLEX MICROSCOPIC
Bilirubin Urine: NEGATIVE
Glucose, UA: NEGATIVE mg/dL
Hgb urine dipstick: NEGATIVE
Ketones, ur: NEGATIVE mg/dL
Leukocytes, UA: NEGATIVE
Nitrite: NEGATIVE
Protein, ur: NEGATIVE mg/dL
Specific Gravity, Urine: 1.01 (ref 1.005–1.030)
pH: 6 (ref 5.0–8.0)

## 2017-05-04 LAB — CBC
HCT: 37.4 % (ref 36.0–46.0)
Hemoglobin: 12.6 g/dL (ref 12.0–15.0)
MCH: 29 pg (ref 26.0–34.0)
MCHC: 33.7 g/dL (ref 30.0–36.0)
MCV: 86.2 fL (ref 78.0–100.0)
Platelets: 349 10*3/uL (ref 150–400)
RBC: 4.34 MIL/uL (ref 3.87–5.11)
RDW: 14.1 % (ref 11.5–15.5)
WBC: 9.1 10*3/uL (ref 4.0–10.5)

## 2017-05-04 LAB — LIPASE, BLOOD: Lipase: 26 U/L (ref 11–51)

## 2017-05-04 NOTE — ED Notes (Signed)
Pt's husband has told staff to take pt off the list and that she'd go to see her own doctor.

## 2017-05-06 ENCOUNTER — Ambulatory Visit (INDEPENDENT_AMBULATORY_CARE_PROVIDER_SITE_OTHER): Payer: 59 | Admitting: Family Medicine

## 2017-05-06 ENCOUNTER — Encounter: Payer: Self-pay | Admitting: Family Medicine

## 2017-05-06 ENCOUNTER — Emergency Department (HOSPITAL_COMMUNITY)
Admission: EM | Admit: 2017-05-06 | Discharge: 2017-05-06 | Disposition: A | Discharge status: Home / Self Care | Payer: 59 | Attending: Emergency Medicine | Admitting: Emergency Medicine

## 2017-05-06 ENCOUNTER — Encounter (HOSPITAL_COMMUNITY): Payer: Self-pay | Admitting: Emergency Medicine

## 2017-05-06 ENCOUNTER — Emergency Department (HOSPITAL_COMMUNITY): Payer: 59

## 2017-05-06 VITALS — BP 122/74 | Temp 98.1°F | Ht 70.0 in | Wt 203.0 lb

## 2017-05-06 DIAGNOSIS — R1084 Generalized abdominal pain: Secondary | ICD-10-CM | POA: Diagnosis not present

## 2017-05-06 DIAGNOSIS — R197 Diarrhea, unspecified: Secondary | ICD-10-CM | POA: Diagnosis not present

## 2017-05-06 DIAGNOSIS — R1110 Vomiting, unspecified: Secondary | ICD-10-CM | POA: Diagnosis not present

## 2017-05-06 DIAGNOSIS — R112 Nausea with vomiting, unspecified: Secondary | ICD-10-CM | POA: Insufficient documentation

## 2017-05-06 DIAGNOSIS — E039 Hypothyroidism, unspecified: Secondary | ICD-10-CM | POA: Diagnosis not present

## 2017-05-06 DIAGNOSIS — R109 Unspecified abdominal pain: Secondary | ICD-10-CM | POA: Diagnosis not present

## 2017-05-06 DIAGNOSIS — A084 Viral intestinal infection, unspecified: Secondary | ICD-10-CM | POA: Diagnosis not present

## 2017-05-06 DIAGNOSIS — Z79899 Other long term (current) drug therapy: Secondary | ICD-10-CM | POA: Diagnosis not present

## 2017-05-06 HISTORY — DX: Polyneuropathy, unspecified: G62.9

## 2017-05-06 HISTORY — DX: Unspecified adrenocortical insufficiency: E27.40

## 2017-05-06 HISTORY — DX: Shortness of breath: R06.02

## 2017-05-06 HISTORY — DX: Orthostatic hypotension: I95.1

## 2017-05-06 HISTORY — DX: Other chronic pain: G89.29

## 2017-05-06 LAB — COMPREHENSIVE METABOLIC PANEL
ALT: 18 U/L (ref 14–54)
AST: 17 U/L (ref 15–41)
Albumin: 4.2 g/dL (ref 3.5–5.0)
Alkaline Phosphatase: 70 U/L (ref 38–126)
Anion gap: 11 (ref 5–15)
BUN: 13 mg/dL (ref 6–20)
CO2: 26 mmol/L (ref 22–32)
Calcium: 9.7 mg/dL (ref 8.9–10.3)
Chloride: 100 mmol/L — ABNORMAL LOW (ref 101–111)
Creatinine, Ser: 0.92 mg/dL (ref 0.44–1.00)
GFR calc Af Amer: >60 mL/min (ref >60)
GFR calc non Af Amer: >60 mL/min (ref >60)
Glucose, Bld: 92 mg/dL (ref 65–99)
Potassium: 3.5 mmol/L (ref 3.5–5.1)
Sodium: 137 mmol/L (ref 135–145)
Total Bilirubin: 0.8 mg/dL (ref 0.3–1.2)
Total Protein: 7.7 g/dL (ref 6.5–8.1)

## 2017-05-06 LAB — URINALYSIS, ROUTINE W REFLEX MICROSCOPIC
Glucose, UA: NEGATIVE mg/dL
Hgb urine dipstick: NEGATIVE
Ketones, ur: NEGATIVE mg/dL
Leukocytes, UA: NEGATIVE
Nitrite: NEGATIVE
Protein, ur: NEGATIVE mg/dL
Specific Gravity, Urine: 1.02 (ref 1.005–1.030)
pH: 6 (ref 5.0–8.0)

## 2017-05-06 LAB — CBC WITH DIFFERENTIAL/PLATELET
Basophils Absolute: 0 10*3/uL (ref 0.0–0.1)
Basophils Relative: 0 %
Eosinophils Absolute: 0.2 10*3/uL (ref 0.0–0.7)
Eosinophils Relative: 2 %
HCT: 39.4 % (ref 36.0–46.0)
Hemoglobin: 13.3 g/dL (ref 12.0–15.0)
Lymphocytes Relative: 11 %
Lymphs Abs: 1.1 10*3/uL (ref 0.7–4.0)
MCH: 29.5 pg (ref 26.0–34.0)
MCHC: 33.8 g/dL (ref 30.0–36.0)
MCV: 87.4 fL (ref 78.0–100.0)
Monocytes Absolute: 0.8 10*3/uL (ref 0.1–1.0)
Monocytes Relative: 8 %
Neutro Abs: 7.4 10*3/uL (ref 1.7–7.7)
Neutrophils Relative %: 79 %
Platelets: 371 10*3/uL (ref 150–400)
RBC: 4.51 MIL/uL (ref 3.87–5.11)
RDW: 14.1 % (ref 11.5–15.5)
WBC: 9.5 10*3/uL (ref 4.0–10.5)

## 2017-05-06 LAB — LIPASE, BLOOD: Lipase: 33 U/L (ref 11–51)

## 2017-05-06 MED ORDER — ONDANSETRON 4 MG PO TBDP: 4.0000 mg | ORAL_TABLET | Freq: Three times a day (TID) | ORAL | 0 refills | Status: DC | PRN

## 2017-05-06 MED ORDER — SODIUM CHLORIDE 0.9 % IV BOLUS (SEPSIS)
1000.0000 mL | Freq: Once | INTRAVENOUS | Status: AC
  Administered 2017-05-06: 1000 mL via INTRAVENOUS

## 2017-05-06 MED ORDER — FAMOTIDINE IN NACL 20-0.9 MG/50ML-% IV SOLN
20.0000 mg | Freq: Once | INTRAVENOUS | Status: AC
  Administered 2017-05-06: 20 mg via INTRAVENOUS
  Filled 2017-05-06: qty 50

## 2017-05-06 MED ORDER — IOPAMIDOL (ISOVUE-300) INJECTION 61%
100.0000 mL | Freq: Once | INTRAVENOUS | Status: AC | PRN
  Administered 2017-05-06: 100 mL via INTRAVENOUS

## 2017-05-06 MED ORDER — ONDANSETRON HCL 4 MG/2ML IJ SOLN: 4.0000 mg | INTRAMUSCULAR | Status: DC | PRN

## 2017-05-06 NOTE — Progress Notes (Signed)
   Subjective:    Patient ID: Kathleen [name], female    DOB: 1961/02/25, 56 y.o.   MRN: [id number]  Leg Pain   Incident onset: fell in july. pain since. The pain is present in the left leg. Treatments tried: oxycodone. The treatment provided moderate relief.  needs refill on oxycodone.   Vomiting, nausea, diarrhea. Started yesterday morning.   Patient having significant nausea and vomiting over the past 3 days started late Friday night into Saturday morning with frequent vomiting watery stools nausea and not feeling good headache body aches denies high fever chills sweats  It should be noted that this patient had significant nausea vomiting diarrhea on Saturday went to the ER at [facility] because of tremendous back up at the ER the patient did not stay  Patient had similar issue in December and the patient states that she received a lot of IV fluid in it exacerbated her chronic lung condition and she had to spend time on a ventilator. She is worried about getting too much IV  The patient has tried Zofran without success is had multiple vomiting episodes unable to even sip on liquids. Review of Systems  Constitutional: Negative for activity change and fever.  HENT: Negative for congestion, ear pain and rhinorrhea.   Eyes: Negative for discharge.  Respiratory: Negative for cough, shortness of breath and wheezing.   Cardiovascular: Negative for chest pain.  Gastrointestinal: Positive for abdominal pain, diarrhea and nausea.       Objective:   Physical Exam  Constitutional: She appears well-developed and well-nourished. No distress.  HENT:  Head: Normocephalic and atraumatic.  Eyes: Right eye exhibits no discharge. Left eye exhibits no discharge.  Neck: No tracheal deviation present.  Cardiovascular: Normal rate, regular rhythm and normal heart sounds.   No murmur heard. Pulmonary/Chest: Effort normal and breath sounds normal. No respiratory distress. She has no wheezes. She has no  rales.  Abdominal: Soft. She exhibits no distension and no mass. There is no tenderness. There is no rebound and no guarding.  Musculoskeletal: She exhibits no edema.  Lymphadenopathy:    She has no cervical adenopathy.  Neurological: She is alert. She exhibits normal muscle tone.  Skin: Skin is warm and dry. No erythema.  Psychiatric: Her behavior is normal.  Vitals reviewed.   Orthostatics blood pressure checked by myself 108/70 sitting and standing 92/58      Assessment & Plan:  Significant gastroenteritis with weakness and probable dehydration possibility of electrolyte abnormalities patient needs to go ahead and be seen in the ER receive IV fluids nausea medicine potentially admitted versus being able to come home undetermined at this moment  ER nurse spoke with to give report  The patient was able to drive here. But I think she needs to be evaluated in the ER.

## 2017-05-06 NOTE — ED Triage Notes (Signed)
Pt c/o n/v/d x 2 days. Triaged at [facility]ng but left bc didn't want to wait. Sent by dr Wolfgang Phoenix today due to orthostatic hypotension in his office this morning. Nad. staedy on feet. C/o "some" dizziness. Mm moist to dry

## 2017-05-06 NOTE — Discharge Instructions (Signed)
Take the prescriptions as directed.  Increase your fluid intake (ie:  Gatoraide) for the next few days, as discussed.  Eat a bland diet and advance to your regular diet slowly as you can tolerate it.   Avoid full strength juices, as well as milk and milk products until your diarrhea has resolved.   Call your regular medical doctor today to schedule a follow up appointment this week.  Return to the Emergency Department immediately if not improving (or even worsening) despite taking the medicines as prescribed, any black or bloody stool or vomit, if you develop a fever over "101," or for any other concerns.

## 2017-05-06 NOTE — ED Provider Notes (Signed)
[facility] DEPT Provider Note   CSN: [id number] Arrival date & time: 05/06/17  0944     History   Chief Complaint Chief Complaint  Patient presents with  . Emesis  . Diarrhea    HPI Kathleen [name] is a 56 y.o. female.  HPI Pt was seen at 1015. Per pt, c/o gradual onset and persistence of multiple intermittent episodes of N/V/D that began 2 to 3 days ago.   Describes the stools as "watery." Has been associated with generalized abd "pain." Pt was evaluated by her PMD today, then sent to the ED "because they thought I was dehydrated." Denies CP/SOB, no back pain, no fevers, no black or blood in stools or emesis.    Past Medical History:  Diagnosis Date  . Adrenal insufficiency ([place])   . Anemia   . Avascular bone necrosis ([place])   . Breast fibrocystic disorder   . Chronic pain   . Fibromyalgia   . Gastroesophageal reflux disease   . Mitral valve prolapse   . Orthostatic hypotension   . Peripheral neuropathy   . Restrictive lung disease    Moderate to severe  . Sarcoidosis    Biopsy proven - [facility]  . SOB (shortness of breath)    chronic    Patient Active Problem List   Diagnosis Date Noted  . Personal history of noncompliance with medical treatment, presenting hazards to health 04/26/2017  . Hypothyroidism 01/15/2017  . Septic shock ([place])   . Elevated troponin   . Symptomatic bradycardia   . Acute respiratory failure with hypoxia ([place])   . Adrenal insufficiency ([place]) 08/17/2016  . Hypokalemia 08/17/2016  . Chronic pain syndrome 04/20/2016  . Vaginal bleeding 01/02/2016  . Acute blood loss anemia 12/31/2015  . Gastroenteritis   . Hypovolemic shock ([place]) 12/30/2015  . Nausea and vomiting 12/29/2015  . Hypotension 12/29/2015  . Dehydration 12/28/2015  . Constipation 11/14/2015  . Avascular necrosis of bone of left hip ([place]) 04/05/2015  . Bimalleolar fracture of right ankle 02/11/2015  . Post herpetic neuralgia 10/22/2014  . Hyperlipidemia 08/25/2014  .  Obstructive sleep apnea 02/18/2014  . Obesity, unspecified 05/06/2013  . Fibromyalgia 02/11/2013  . Depression 02/11/2013  . Abdominal pain, other specified site 02/11/2013  . Shingles 02/02/2013  . Urinary retention 02/02/2013  . Pedal edema 01/22/2013  . Orthostatic hypotension 01/16/2013  . Disturbance of skin sensation 11/26/2012  . Pain in limb 11/26/2012  . Helicobacter pylori gastritis 05/23/2012  . Cough 05/02/2012  . Fibrocystic breast disease in female 04/09/2012  . DOE (dyspnea on exertion) 08/03/2011  . Anxiety state 10/19/2010  . CHEST PAIN 11/22/2008  . Sarcoidosis 06/21/2008    Past Surgical History:  Procedure Laterality Date  . BREAST LUMPECTOMY  01/12/2011   right  . TUBAL LIGATION      OB History    Gravida Para Term Preterm AB Living   1 1 1          SAB TAB Ectopic Multiple Live Births                   Home Medications    Prior to Admission medications   Medication Sig Start Date End Date Taking? Authorizing Provider  acetaminophen (TYLENOL) 325 MG tablet Take 2 tablets (650 mg total) by mouth every 6 (six) hours as needed for mild pain (or Fever >/= 101). 08/21/16  Yes Corey Harold, NP  albuterol (PROVENTIL) (2.5 MG/3ML) 0.083% nebulizer solution Take 3 mLs (2.5 mg total) by  nebulization every 2 (two) hours as needed for wheezing or shortness of breath. 02/07/17  Yes [name], MD  ferrous sulfate 325 (65 FE) MG EC tablet Take 325 mg by mouth daily with breakfast.   Yes [provider]  levothyroxine (SYNTHROID) 75 MCG tablet Take 1 tablet (75 mcg total) by mouth daily before breakfast. 04/26/17  Yes Nida, Marella Chimes, MD  midodrine (PROAMATINE) 5 MG tablet Take 1 tablet (5 mg total) by mouth 2 (two) times daily with a meal. 02/07/17  Yes [name], MD  oxyCODONE-acetaminophen (ROXICET) 5-325 MG tablet 1 bid prn pain 04/08/17  Yes Luking, Scott A, MD  pantoprazole (PROTONIX) 40 MG tablet TAKE 1 TABLET BY MOUTH ONCE DAILY  02/20/17  Yes [name], MD  predniSONE (DELTASONE) 10 MG tablet Take 1 tablet (10 mg total) by mouth daily with breakfast. 10/16/16  Yes Nida, Marella Chimes, MD  sertraline (ZOLOFT) 50 MG tablet Take 1 tablet (50 mg total) by mouth daily. Patient not taking: Reported on 05/06/2017 04/12/17 04/12/18  Cloria Spring, MD    Family History Family History  Problem Relation Age of Onset  . Cardiomyopathy Mother   . Breast cancer Mother   . Prostate cancer Father   . Neurofibromatosis Brother   . Heart disease Maternal Aunt   . Bipolar disorder Daughter   . Colon cancer Neg Hx     Social History Social History  Substance Use Topics  . Smoking status: Never Smoker  . Smokeless tobacco: Never Used  . Alcohol use No     Allergies   Bee venom; Other; Codeine; and Morphine and related   Review of Systems Review of Systems ROS: Statement: All systems negative except as marked or noted in the HPI; Constitutional: Negative for fever and chills. ; ; Eyes: Negative for eye pain, redness and discharge. ; ; ENMT: Negative for ear pain, hoarseness, nasal congestion, sinus pressure and sore throat. ; ; Cardiovascular: Negative for chest pain, palpitations, diaphoresis, dyspnea and peripheral edema. ; ; Respiratory: Negative for cough, wheezing and stridor. ; ; Gastrointestinal: +abd pain, N/V/D. Negative for blood in stool, hematemesis, jaundice and rectal bleeding. . ; ; Genitourinary: Negative for dysuria, flank pain and hematuria. ; ; Musculoskeletal: Negative for back pain and neck pain. Negative for swelling and trauma.; ; Skin: Negative for pruritus, rash, abrasions, blisters, bruising and skin lesion.; ; Neuro: Negative for headache, lightheadedness and neck stiffness. Negative for weakness, altered level of consciousness, altered mental status, extremity weakness, paresthesias, involuntary movement, seizure and syncope.       Physical Exam Updated Vital Signs BP 119/62 (BP Location:  Right Arm)   Pulse 84   Temp 97.8 F (36.6 C) (Oral)   Resp 17   SpO2 97%   Physical Exam 1020: Physical examination:  Nursing notes reviewed; Vital signs and O2 SAT reviewed;  Constitutional: Well developed, Well nourished, Well hydrated, In no acute distress; Head:  Normocephalic, atraumatic; Eyes: EOMI, PERRL, No scleral icterus; ENMT: Mouth and pharynx normal, Mucous membranes moist; Neck: Supple, Full range of motion, No lymphadenopathy; Cardiovascular: Regular rate and rhythm, No gallop; Respiratory: Breath sounds clear & equal bilaterally, No wheezes.  Speaking full sentences with ease, Normal respiratory effort/excursion; Chest: Nontender, Movement normal; Abdomen: Soft, +diffuse tenderness to palp. No rebound or guarding. Nondistended, Normal bowel sounds; Genitourinary: No CVA tenderness; Extremities: Pulses normal, No tenderness, No edema, No calf edema or asymmetry.; Neuro: AA&Ox3, Major CN grossly intact.  Speech clear. No  gross focal motor or sensory deficits in extremities. Climbs on and off stretcher easily by herself. Gait steady.; Skin: Color normal, Warm, Dry.   ED Treatments / Results  Labs (all labs ordered are listed, but only abnormal results are displayed)   EKG  EKG Interpretation None       Radiology   Procedures Procedures (including critical care time)  Medications Ordered in ED Medications  ondansetron (ZOFRAN) injection 4 mg (not administered)  sodium chloride 0.9 % bolus 1,000 mL (1,000 mLs Intravenous New Bag/Given 05/06/17 1046)  famotidine (PEPCID) IVPB 20 mg premix (0 mg Intravenous Stopped 05/06/17 1115)     Initial Impression / Assessment and Plan / ED Course  I have reviewed the triage vital signs and the nursing notes.  Pertinent labs & imaging results that were available during my care of the patient were reviewed by me and considered in my medical decision making (see chart for details).  MDM Reviewed: previous chart, nursing note and  vitals Reviewed previous: labs Interpretation: labs and CT scan   Results for orders placed or performed during the hospital encounter of 05/06/17  Urinalysis, Routine w reflex microscopic  Result Value Ref Range   Color, Urine YELLOW YELLOW   APPearance CLEAR CLEAR   Specific Gravity, Urine 1.020 1.005 - 1.030   pH 6.0 5.0 - 8.0   Glucose, UA NEGATIVE NEGATIVE mg/dL   Hgb urine dipstick NEGATIVE NEGATIVE   Bilirubin Urine SMALL (A) NEGATIVE   Ketones, ur NEGATIVE NEGATIVE mg/dL   Protein, ur NEGATIVE NEGATIVE mg/dL   Nitrite NEGATIVE NEGATIVE   Leukocytes, UA NEGATIVE NEGATIVE  Comprehensive metabolic panel  Result Value Ref Range   Sodium 137 135 - 145 mmol/L   Potassium 3.5 3.5 - 5.1 mmol/L   Chloride 100 (L) 101 - 111 mmol/L   CO2 26 22 - 32 mmol/L   Glucose, Bld 92 65 - 99 mg/dL   BUN 13 6 - 20 mg/dL   Creatinine, Ser 0.92 0.44 - 1.00 mg/dL   Calcium 9.7 8.9 - 10.3 mg/dL   Total Protein 7.7 6.5 - 8.1 g/dL   Albumin 4.2 3.5 - 5.0 g/dL   AST 17 15 - 41 U/L   ALT 18 14 - 54 U/L   Alkaline Phosphatase 70 38 - 126 U/L   Total Bilirubin 0.8 0.3 - 1.2 mg/dL   GFR calc non Af Amer >60 >60 mL/min   GFR calc Af Amer >60 >60 mL/min   Anion gap 11 5 - 15  Lipase, blood  Result Value Ref Range   Lipase 33 11 - 51 U/L  CBC with Differential  Result Value Ref Range   WBC 9.5 4.0 - 10.5 K/uL   RBC 4.51 3.87 - 5.11 MIL/uL   Hemoglobin 13.3 12.0 - 15.0 g/dL   HCT 39.4 36.0 - 46.0 %   MCV 87.4 78.0 - 100.0 fL   MCH 29.5 26.0 - 34.0 pg   MCHC 33.8 30.0 - 36.0 g/dL   RDW 14.1 11.5 - 15.5 %   Platelets 371 150 - 400 K/uL   Neutrophils Relative % 79 %   Neutro Abs 7.4 1.7 - 7.7 K/uL   Lymphocytes Relative 11 %   Lymphs Abs 1.1 0.7 - 4.0 K/uL   Monocytes Relative 8 %   Monocytes Absolute 0.8 0.1 - 1.0 K/uL   Eosinophils Relative 2 %   Eosinophils Absolute 0.2 0.0 - 0.7 K/uL   Basophils Relative 0 %   Basophils Absolute 0.0  0.0 - 0.1 K/uL   Ct Abdomen Pelvis W  Contrast Result Date: 05/06/2017 CLINICAL DATA:  Abdominal pain, nausea, vomiting. EXAM: CT ABDOMEN AND PELVIS WITH CONTRAST TECHNIQUE: Multidetector CT imaging of the abdomen and pelvis was performed using the standard protocol following bolus administration of intravenous contrast. CONTRAST:  158mL ISOVUE-300 IOPAMIDOL (ISOVUE-300) INJECTION 61% COMPARISON:  01/11/2013 FINDINGS: Lower chest: No acute abnormality. Hepatobiliary: No focal hepatic abnormality. Gallbladder unremarkable. Pancreas: Fatty replacement of the pancreatic head. No pancreatic mass or ductal dilatation. Spleen: No focal abnormality.  Normal size. Adrenals/Urinary Tract: No adrenal abnormality. No focal renal abnormality. No stones or hydronephrosis. Urinary bladder is unremarkable. Stomach/Bowel: Stomach, large and small bowel grossly unremarkable. Appendix not visualized. No inflammatory process in the right lower quadrant. Vascular/Lymphatic: No evidence of aneurysm or adenopathy. Reproductive: Uterus and adnexa unremarkable.  No mass. Other: No free fluid or free air. Musculoskeletal: No acute bony abnormality. IMPRESSION: No acute findings in the abdomen or pelvis. Electronically Signed   By: Rolm Baptise M.D.   On: 05/06/2017 12:33    1330: Pt has tol PO well while in the ED without N/V.  No stooling while in the ED.  Abd benign, VSS. Feels better and wants to go home now. Workup reassuring. Tx symptomatically at this time. Dx and testing d/w pt and family.  Questions answered.  Verb understanding, agreeable to d/c home with outpt f/u.    Final Clinical Impressions(s) / ED Diagnoses   Final diagnoses:  None    New Prescriptions New Prescriptions   No medications on file     Francine Graven, DO 05/09/17 2641

## 2017-05-07 DIAGNOSIS — G629 Polyneuropathy, unspecified: Secondary | ICD-10-CM | POA: Diagnosis not present

## 2017-05-07 DIAGNOSIS — R531 Weakness: Secondary | ICD-10-CM | POA: Diagnosis not present

## 2017-05-07 DIAGNOSIS — G4733 Obstructive sleep apnea (adult) (pediatric): Secondary | ICD-10-CM | POA: Diagnosis not present

## 2017-05-07 DIAGNOSIS — F329 Major depressive disorder, single episode, unspecified: Secondary | ICD-10-CM | POA: Diagnosis not present

## 2017-05-07 DIAGNOSIS — M79604 Pain in right leg: Secondary | ICD-10-CM | POA: Diagnosis not present

## 2017-05-07 DIAGNOSIS — M79605 Pain in left leg: Secondary | ICD-10-CM | POA: Diagnosis not present

## 2017-05-07 DIAGNOSIS — G894 Chronic pain syndrome: Secondary | ICD-10-CM | POA: Diagnosis not present

## 2017-05-07 DIAGNOSIS — M797 Fibromyalgia: Secondary | ICD-10-CM | POA: Diagnosis not present

## 2017-05-07 DIAGNOSIS — R270 Ataxia, unspecified: Secondary | ICD-10-CM | POA: Diagnosis not present

## 2017-05-07 LAB — URINE CULTURE

## 2017-05-08 DIAGNOSIS — M79605 Pain in left leg: Secondary | ICD-10-CM | POA: Diagnosis not present

## 2017-05-08 DIAGNOSIS — G894 Chronic pain syndrome: Secondary | ICD-10-CM | POA: Diagnosis not present

## 2017-05-08 DIAGNOSIS — G629 Polyneuropathy, unspecified: Secondary | ICD-10-CM | POA: Diagnosis not present

## 2017-05-08 DIAGNOSIS — M79604 Pain in right leg: Secondary | ICD-10-CM | POA: Diagnosis not present

## 2017-05-08 DIAGNOSIS — R270 Ataxia, unspecified: Secondary | ICD-10-CM | POA: Diagnosis not present

## 2017-05-08 DIAGNOSIS — F329 Major depressive disorder, single episode, unspecified: Secondary | ICD-10-CM | POA: Diagnosis not present

## 2017-05-08 DIAGNOSIS — R531 Weakness: Secondary | ICD-10-CM | POA: Diagnosis not present

## 2017-05-08 DIAGNOSIS — M797 Fibromyalgia: Secondary | ICD-10-CM | POA: Diagnosis not present

## 2017-05-08 DIAGNOSIS — G4733 Obstructive sleep apnea (adult) (pediatric): Secondary | ICD-10-CM | POA: Diagnosis not present

## 2017-05-13 DIAGNOSIS — G4733 Obstructive sleep apnea (adult) (pediatric): Secondary | ICD-10-CM | POA: Diagnosis not present

## 2017-05-13 DIAGNOSIS — R270 Ataxia, unspecified: Secondary | ICD-10-CM | POA: Diagnosis not present

## 2017-05-13 DIAGNOSIS — F329 Major depressive disorder, single episode, unspecified: Secondary | ICD-10-CM | POA: Diagnosis not present

## 2017-05-13 DIAGNOSIS — M79605 Pain in left leg: Secondary | ICD-10-CM | POA: Diagnosis not present

## 2017-05-13 DIAGNOSIS — M79604 Pain in right leg: Secondary | ICD-10-CM | POA: Diagnosis not present

## 2017-05-13 DIAGNOSIS — G894 Chronic pain syndrome: Secondary | ICD-10-CM | POA: Diagnosis not present

## 2017-05-13 DIAGNOSIS — G629 Polyneuropathy, unspecified: Secondary | ICD-10-CM | POA: Diagnosis not present

## 2017-05-13 DIAGNOSIS — R531 Weakness: Secondary | ICD-10-CM | POA: Diagnosis not present

## 2017-05-13 DIAGNOSIS — M797 Fibromyalgia: Secondary | ICD-10-CM | POA: Diagnosis not present

## 2017-05-17 DIAGNOSIS — M79604 Pain in right leg: Secondary | ICD-10-CM | POA: Diagnosis not present

## 2017-05-17 DIAGNOSIS — G629 Polyneuropathy, unspecified: Secondary | ICD-10-CM | POA: Diagnosis not present

## 2017-05-17 DIAGNOSIS — F329 Major depressive disorder, single episode, unspecified: Secondary | ICD-10-CM | POA: Diagnosis not present

## 2017-05-17 DIAGNOSIS — R531 Weakness: Secondary | ICD-10-CM | POA: Diagnosis not present

## 2017-05-17 DIAGNOSIS — G4733 Obstructive sleep apnea (adult) (pediatric): Secondary | ICD-10-CM | POA: Diagnosis not present

## 2017-05-17 DIAGNOSIS — M79605 Pain in left leg: Secondary | ICD-10-CM | POA: Diagnosis not present

## 2017-05-17 DIAGNOSIS — G894 Chronic pain syndrome: Secondary | ICD-10-CM | POA: Diagnosis not present

## 2017-05-17 DIAGNOSIS — R270 Ataxia, unspecified: Secondary | ICD-10-CM | POA: Diagnosis not present

## 2017-05-17 DIAGNOSIS — M797 Fibromyalgia: Secondary | ICD-10-CM | POA: Diagnosis not present

## 2017-05-20 DIAGNOSIS — G629 Polyneuropathy, unspecified: Secondary | ICD-10-CM | POA: Diagnosis not present

## 2017-05-20 DIAGNOSIS — R531 Weakness: Secondary | ICD-10-CM | POA: Diagnosis not present

## 2017-05-20 DIAGNOSIS — G894 Chronic pain syndrome: Secondary | ICD-10-CM | POA: Diagnosis not present

## 2017-05-20 DIAGNOSIS — R270 Ataxia, unspecified: Secondary | ICD-10-CM | POA: Diagnosis not present

## 2017-05-21 ENCOUNTER — Encounter: Payer: Self-pay | Admitting: Family Medicine

## 2017-05-21 ENCOUNTER — Ambulatory Visit (INDEPENDENT_AMBULATORY_CARE_PROVIDER_SITE_OTHER): Payer: 59 | Admitting: Family Medicine

## 2017-05-21 VITALS — BP 118/72 | Ht 70.0 in | Wt 221.1 lb

## 2017-05-21 DIAGNOSIS — G894 Chronic pain syndrome: Secondary | ICD-10-CM | POA: Diagnosis not present

## 2017-05-21 DIAGNOSIS — E039 Hypothyroidism, unspecified: Secondary | ICD-10-CM

## 2017-05-21 MED ORDER — MIDODRINE HCL 5 MG PO TABS: 5.0000 mg | ORAL_TABLET | Freq: Two times a day (BID) | ORAL | 4 refills | Status: DC

## 2017-05-21 MED ORDER — OXYCODONE-ACETAMINOPHEN 5-325 MG PO TABS: ORAL_TABLET | ORAL | 0 refills | Status: DC

## 2017-05-21 MED ORDER — TRAZODONE HCL 50 MG PO TABS: 25.0000 mg | ORAL_TABLET | Freq: Every evening | ORAL | 3 refills | Status: DC | PRN

## 2017-05-21 MED FILL — PANTOPRAZOLE SOD DR 40 MG T: 40 | 90 days supply | Qty: 90 | Fill #1

## 2017-05-21 MED FILL — predniSONE 10 MG TABS: 10 | 30 days supply | Qty: 30 | Fill #6

## 2017-05-21 MED FILL — MIDODRINE HCL 5 MG TABLET: 5 | 30 days supply | Qty: 60 | Fill #0

## 2017-05-21 MED FILL — traZODone HCL 50 MG TABS: 50 | 30 days supply | Qty: 30 | Fill #0

## 2017-05-21 NOTE — Progress Notes (Signed)
   Subjective:    Patient ID: Kathleen [name], female    DOB: [birth year], 56 y.o.   MRN: [id number]  HPI  Patient in today for a 6 month follow up on Hypothyroidism. She takes her thyroid medicine usually but 2 months ago she stopped taking it because she states she felt it was causing her to be ill she understands she needs to be on thyroid medicine and she agrees to having some testing. She states she takes her pain medicine on a twice-daily basis it helps keep the pain under some control she denies being depressed currently States no other concerns this visit.  The patient does relate she has her ongoing lower lower leg pain on the left side related to the fall that occurred at the nursing center several months back she did do physical therapy did help so she relates burning aching sensation it causes her to limp.  She has chronic left hip pain for which she takes oxycodone intermittently this also contributes to her leg discomfort   Review of Systems  Constitutional: Negative for activity change and appetite change.  HENT: Negative for congestion.   Respiratory: Negative for cough.   Cardiovascular: Negative for chest pain.  Gastrointestinal: Negative for abdominal pain and vomiting.  Skin: Negative for color change.  Neurological: Negative for weakness.  Psychiatric/Behavioral: Negative for confusion.       Objective:   Physical Exam  Constitutional: She appears well-nourished. No distress.  HENT:  Head: Normocephalic.  Right Ear: External ear normal.  Left Ear: External ear normal.  Eyes: Right eye exhibits no discharge. Left eye exhibits no discharge.  Neck: No tracheal deviation present.  Cardiovascular: Normal rate, regular rhythm and normal heart sounds.   No murmur heard. Pulmonary/Chest: Effort normal and breath sounds normal. No respiratory distress. She has no wheezes. She has no rales.  Musculoskeletal: She exhibits no edema.  Lymphadenopathy:    She has no  cervical adenopathy.  Neurological: She is alert.  Psychiatric: Her behavior is normal.  Vitals reviewed.         Assessment & Plan:  Chronic pain-3 prescriptions written for the patient follow-up in 3 months Left leg pain chronic-orthopedic at this point does not want to do replacement Lower leg pain from recent accidental fall gradually getting better but still causing significant pain and causing limping She has done physical therapy which did seem to help She will follow-up here in 3 months regarding her chronic pain She is not been taking her thyroid medicine we ordered a thyroid test she believes that medication caused her to get second be in the hospital I do not feel that that is the case She will follow-up with endocrinology later this fall

## 2017-05-23 MED FILL — OXYCOD/ACETAMINOPHEN 5-325M: 5-325 | 30 days supply | Qty: 60 | Fill #0

## 2017-05-26 ENCOUNTER — Encounter (HOSPITAL_COMMUNITY): Payer: Self-pay

## 2017-05-26 ENCOUNTER — Emergency Department (HOSPITAL_COMMUNITY)
Admission: EM | Admit: 2017-05-26 | Discharge: 2017-05-26 | Disposition: A | Discharge status: Home / Self Care | Payer: 59 | Attending: Emergency Medicine | Admitting: Emergency Medicine

## 2017-05-26 ENCOUNTER — Emergency Department (HOSPITAL_COMMUNITY): Payer: 59

## 2017-05-26 DIAGNOSIS — R1110 Vomiting, unspecified: Secondary | ICD-10-CM

## 2017-05-26 DIAGNOSIS — E876 Hypokalemia: Secondary | ICD-10-CM | POA: Diagnosis not present

## 2017-05-26 DIAGNOSIS — Z79899 Other long term (current) drug therapy: Secondary | ICD-10-CM | POA: Insufficient documentation

## 2017-05-26 DIAGNOSIS — R112 Nausea with vomiting, unspecified: Secondary | ICD-10-CM | POA: Insufficient documentation

## 2017-05-26 DIAGNOSIS — E785 Hyperlipidemia, unspecified: Secondary | ICD-10-CM | POA: Insufficient documentation

## 2017-05-26 DIAGNOSIS — E039 Hypothyroidism, unspecified: Secondary | ICD-10-CM | POA: Insufficient documentation

## 2017-05-26 DIAGNOSIS — R109 Unspecified abdominal pain: Secondary | ICD-10-CM | POA: Diagnosis not present

## 2017-05-26 LAB — COMPREHENSIVE METABOLIC PANEL
ALT: 16 U/L (ref 14–54)
AST: 19 U/L (ref 15–41)
Albumin: 3.7 g/dL (ref 3.5–5.0)
Alkaline Phosphatase: 69 U/L (ref 38–126)
Anion gap: 11 (ref 5–15)
BUN: 13 mg/dL (ref 6–20)
CO2: 25 mmol/L (ref 22–32)
Calcium: 9.5 mg/dL (ref 8.9–10.3)
Chloride: 102 mmol/L (ref 101–111)
Creatinine, Ser: 0.81 mg/dL (ref 0.44–1.00)
GFR calc Af Amer: >60 mL/min (ref >60)
GFR calc non Af Amer: >60 mL/min (ref >60)
Glucose, Bld: 120 mg/dL — ABNORMAL HIGH (ref 65–99)
Potassium: 3.3 mmol/L — ABNORMAL LOW (ref 3.5–5.1)
Sodium: 138 mmol/L (ref 135–145)
Total Bilirubin: 0.8 mg/dL (ref 0.3–1.2)
Total Protein: 6.9 g/dL (ref 6.5–8.1)

## 2017-05-26 LAB — CBC
HCT: 34.9 % — ABNORMAL LOW (ref 36.0–46.0)
Hemoglobin: 11.9 g/dL — ABNORMAL LOW (ref 12.0–15.0)
MCH: 29.5 pg (ref 26.0–34.0)
MCHC: 34.1 g/dL (ref 30.0–36.0)
MCV: 86.4 fL (ref 78.0–100.0)
Platelets: 317 10*3/uL (ref 150–400)
RBC: 4.04 MIL/uL (ref 3.87–5.11)
RDW: 14 % (ref 11.5–15.5)
WBC: 6.8 10*3/uL (ref 4.0–10.5)

## 2017-05-26 LAB — LIPASE, BLOOD: Lipase: 34 U/L (ref 11–51)

## 2017-05-26 MED ORDER — SODIUM CHLORIDE 0.9 % IV BOLUS (SEPSIS)
1000.0000 mL | Freq: Once | INTRAVENOUS | Status: AC
Start: 2017-05-26 — End: 2017-05-26
  Administered 2017-05-26: 1000 mL via INTRAVENOUS

## 2017-05-26 MED ORDER — POTASSIUM CHLORIDE CRYS ER 20 MEQ PO TBCR: 20.0000 meq | EXTENDED_RELEASE_TABLET | Freq: Every day | ORAL | 0 refills | Status: DC

## 2017-05-26 MED ORDER — AMMONIA AROMATIC IN INHA
RESPIRATORY_TRACT | Status: AC
  Filled 2017-05-26: qty 10

## 2017-05-26 MED ORDER — ACETAMINOPHEN 325 MG PO TABS
650.0000 mg | ORAL_TABLET | Freq: Once | ORAL | Status: AC
  Administered 2017-05-26: 650 mg via ORAL
  Filled 2017-05-26: qty 2

## 2017-05-26 MED ORDER — PROMETHAZINE HCL 25 MG/ML IJ SOLN
25.0000 mg | INTRAMUSCULAR | Status: AC | PRN
  Administered 2017-05-26: 25 mg via INTRAVENOUS
  Filled 2017-05-26: qty 1

## 2017-05-26 MED ORDER — PROMETHAZINE HCL 25 MG PO TABS
25.0000 mg | ORAL_TABLET | Freq: Four times a day (QID) | ORAL | 0 refills | Status: DC | PRN
Start: 2017-05-26 — End: 2017-11-04

## 2017-05-26 MED ORDER — POTASSIUM CHLORIDE CRYS ER 20 MEQ PO TBCR
40.0000 meq | EXTENDED_RELEASE_TABLET | Freq: Once | ORAL | Status: AC
  Administered 2017-05-26: 40 meq via ORAL
  Filled 2017-05-26: qty 2

## 2017-05-26 MED ORDER — ONDANSETRON HCL 4 MG/2ML IJ SOLN
4.0000 mg | Freq: Once | INTRAMUSCULAR | Status: AC | PRN
  Administered 2017-05-26: 4 mg via INTRAVENOUS
  Filled 2017-05-26: qty 2

## 2017-05-26 NOTE — ED Triage Notes (Signed)
Patient states she may have consumed bad food. Reports of nausea, vomiting and epigastric pain since yesterday morning.

## 2017-05-26 NOTE — ED Provider Notes (Signed)
[facility] DEPT Provider Note   CSN: [id number] Arrival date & time: 05/26/17  1034     History   Chief Complaint Chief Complaint  Patient presents with  . Abdominal Pain  . Emesis    HPI Kathleen [name] is a 56 y.o. female.  HPI  56 year old female with a history of sarcoidosis, restrictive lung disease, hypothyroidism presents with nausea, vomiting, and abdominal pain. Nausea and vomiting started yesterday. Her husband has had similar nausea and they think it's from what they ate at time of [place]. She's been having vomiting but no diarrhea. No hematemesis. After vomiting she eventually developed upper abdominal pain. This morning she has developed some chest pain that she thinks is from throwing up. No new shortness of breath. No fevers. Denies any urinary or vaginal symptoms. She states she frequently gets vomiting episodes like this. She tried Zofran yesterday but had no relief. Developed a headache this morning and took tylenol at about 1 am.  Past Medical History:  Diagnosis Date  . Adrenal insufficiency ([place])   . Anemia   . Avascular bone necrosis ([place])   . Breast fibrocystic disorder   . Chronic pain   . Fibromyalgia   . Gastroesophageal reflux disease   . Mitral valve prolapse   . Orthostatic hypotension   . Peripheral neuropathy   . Restrictive lung disease    Moderate to severe  . Sarcoidosis    Biopsy proven - [facility]  . SOB (shortness of breath)    chronic    Patient Active Problem List   Diagnosis Date Noted  . Personal history of noncompliance with medical treatment, presenting hazards to health 04/26/2017  . Hypothyroidism 01/15/2017  . Septic shock ([place])   . Elevated troponin   . Symptomatic bradycardia   . Acute respiratory failure with hypoxia ([place])   . Adrenal insufficiency ([place]) 08/17/2016  . Hypokalemia 08/17/2016  . Chronic pain syndrome 04/20/2016  . Vaginal bleeding 01/02/2016  . Acute blood loss anemia 12/31/2015  . Gastroenteritis   .  Hypovolemic shock ([place]) 12/30/2015  . Nausea and vomiting 12/29/2015  . Hypotension 12/29/2015  . Dehydration 12/28/2015  . Constipation 11/14/2015  . Avascular necrosis of bone of left hip ([place]) 04/05/2015  . Bimalleolar fracture of right ankle 02/11/2015  . Post herpetic neuralgia 10/22/2014  . Hyperlipidemia 08/25/2014  . Obstructive sleep apnea 02/18/2014  . Obesity, unspecified 05/06/2013  . Fibromyalgia 02/11/2013  . Depression 02/11/2013  . Abdominal pain, other specified site 02/11/2013  . Shingles 02/02/2013  . Urinary retention 02/02/2013  . Pedal edema 01/22/2013  . Orthostatic hypotension 01/16/2013  . Disturbance of skin sensation 11/26/2012  . Pain in limb 11/26/2012  . Helicobacter pylori gastritis 05/23/2012  . Cough 05/02/2012  . Fibrocystic breast disease in female 04/09/2012  . DOE (dyspnea on exertion) 08/03/2011  . Anxiety state 10/19/2010  . CHEST PAIN 11/22/2008  . Sarcoidosis 06/21/2008    Past Surgical History:  Procedure Laterality Date  . BREAST LUMPECTOMY  01/12/2011   right  . TUBAL LIGATION      OB History    Gravida Para Term Preterm AB Living   1 1 1          SAB TAB Ectopic Multiple Live Births                   Home Medications    Prior to Admission medications   Medication Sig Start Date End Date Taking? Authorizing Provider  acetaminophen (TYLENOL) 325 MG tablet  Take 2 tablets (650 mg total) by mouth every 6 (six) hours as needed for mild pain (or Fever >/= 101). 08/21/16  Yes Corey Harold, NP  albuterol (PROVENTIL) (2.5 MG/3ML) 0.083% nebulizer solution Take 3 mLs (2.5 mg total) by nebulization every 2 (two) hours as needed for wheezing or shortness of breath. 02/07/17  Yes Luking, Elayne Snare, MD  ferrous sulfate 325 (65 FE) MG EC tablet Take 325 mg by mouth daily with breakfast.   Yes [provider]  levothyroxine (SYNTHROID, LEVOTHROID) 50 MCG tablet Take 50 mcg by mouth daily. 03/19/17  Yes [provider]    midodrine (PROAMATINE) 5 MG tablet Take 1 tablet (5 mg total) by mouth 2 (two) times daily with a meal. 05/21/17  Yes [name], MD  oxyCODONE-acetaminophen (ROXICET) 5-325 MG tablet 1 bid prn pain 05/21/17  Yes Luking, Maximo Spratling A, MD  pantoprazole (PROTONIX) 40 MG tablet TAKE 1 TABLET BY MOUTH ONCE DAILY 02/20/17  Yes [name], MD  predniSONE (DELTASONE) 10 MG tablet Take 1 tablet (10 mg total) by mouth daily with breakfast. 10/16/16  Yes Nida, Marella Chimes, MD  traZODone (DESYREL) 50 MG tablet Take 0.5-1 tablets (25-50 mg total) by mouth at bedtime as needed for sleep. 05/21/17  Yes Luking, Elayne Snare, MD  ondansetron (ZOFRAN ODT) 4 MG disintegrating tablet Take 1 tablet (4 mg total) by mouth every 8 (eight) hours as needed for nausea or vomiting. Patient not taking: Reported on 05/21/2017 05/06/17   Francine Graven, DO  potassium chloride SA (K-DUR,KLOR-CON) 20 MEQ tablet Take 1 tablet (20 mEq total) by mouth daily. 05/26/17   Sherwood Gambler, MD  promethazine (PHENERGAN) 25 MG tablet Take 1 tablet (25 mg total) by mouth every 6 (six) hours as needed for nausea or vomiting. 05/26/17   Sherwood Gambler, MD    Family History Family History  Problem Relation Age of Onset  . Cardiomyopathy Mother   . Breast cancer Mother   . Prostate cancer Father   . Neurofibromatosis Brother   . Heart disease Maternal Aunt   . Bipolar disorder Daughter   . Colon cancer Neg Hx     Social History Social History  Substance Use Topics  . Smoking status: Never Smoker  . Smokeless tobacco: Never Used  . Alcohol use No     Allergies   Bee venom; Other; Codeine; and Morphine and related   Review of Systems Review of Systems  Constitutional: Negative for fever.  Respiratory: Negative for shortness of breath.   Cardiovascular: Positive for chest pain.  Gastrointestinal: Positive for abdominal pain, nausea and vomiting. Negative for diarrhea.  Genitourinary: Negative for dysuria, hematuria,  vaginal bleeding and vaginal discharge.  Neurological: Positive for headaches.  All other systems reviewed and are negative.    Physical Exam Updated Vital Signs BP 128/67 (BP Location: Right Arm)   Pulse 60   Temp 97.9 F (36.6 C) (Oral)   Resp 18   Ht 5\' 10"  (1.778 m)   Wt 93 kg (205 lb)   SpO2 94%   BMI 29.41 kg/m   Physical Exam  Constitutional: She is oriented to person, place, and time. She appears well-developed and well-nourished.  obese  HENT:  Head: Normocephalic and atraumatic.  Right Ear: External ear normal.  Left Ear: External ear normal.  Nose: Nose normal.  Eyes: Right eye exhibits no discharge. Left eye exhibits no discharge.  Cardiovascular: Normal rate, regular rhythm and normal heart sounds.   Pulmonary/Chest: Effort normal  and breath sounds normal. She exhibits tenderness (mid chest, mild, no crepitus).  Abdominal: Soft. There is tenderness in the epigastric area and left upper quadrant.  Neurological: She is alert and oriented to person, place, and time.  Skin: Skin is warm and dry. She is not diaphoretic.  Nursing note and vitals reviewed.    ED Treatments / Results  Labs (all labs ordered are listed, but only abnormal results are displayed) Labs Reviewed  COMPREHENSIVE METABOLIC PANEL - Abnormal; Notable for the following:       Result Value   Potassium 3.3 (*)    Glucose, Bld 120 (*)    All other components within normal limits  CBC - Abnormal; Notable for the following:    Hemoglobin 11.9 (*)    HCT 34.9 (*)    All other components within normal limits  LIPASE, BLOOD    EKG  EKG Interpretation  Date/Time:  Sunday May 26 2017 10:50:57 EDT Ventricular Rate:  77 PR Interval:    QRS Duration: 87 QT Interval:  358 QTC Calculation: 406 R Axis:   60 Text Interpretation:  Sinus rhythm Abnormal R-wave progression, early transition Borderline T abnormalities, diffuse leads T wave changes in same distribution as Dec 2017 Confirmed by  Sherwood Gambler [phone]) on 05/26/2017 10:53:26 AM       Radiology Dg Abd Acute W/chest  Result Date: 05/26/2017 CLINICAL DATA:  Abdominal pain and vomiting for 2 days. History of sarcoid. EXAM: DG ABDOMEN ACUTE W/ 1V CHEST COMPARISON:  05/06/2017 CT, 08/21/2016 radiographs and prior exams. FINDINGS: Elevation of the right hemidiaphragm again noted. Mediastinal and hilar fullness again noted. The cardiopericardial silhouette is unchanged. No focal airspace disease, pleural effusion or pneumothorax. The bowel gas pattern is unremarkable. No bowel obstruction or pneumoperitoneum. No suspicious calcifications are identified. No acute bony abnormalities are present. AVN of the left femoral head is again noted. IMPRESSION: 1. No evidence of acute abnormality. Unremarkable bowel gas pattern. 2. Unchanged mediastinal and hilar prominence likely related to sarcoid. Electronically Signed   By: Margarette Canada M.D.   On: 05/26/2017 12:53    Procedures Procedures (including critical care time)  Medications Ordered in ED Medications  ondansetron (ZOFRAN) injection 4 mg (4 mg Intravenous Given 05/26/17 1110)  sodium chloride 0.9 % bolus 1,000 mL (0 mLs Intravenous Stopped 05/26/17 1300)  acetaminophen (TYLENOL) tablet 650 mg (650 mg Oral Given 05/26/17 1152)  promethazine (PHENERGAN) injection 25 mg (25 mg Intravenous Given 05/26/17 1223)  potassium chloride SA (K-DUR,KLOR-CON) CR tablet 40 mEq (40 mEq Oral Given 05/26/17 1152)     Initial Impression / Assessment and Plan / ED Course  I have reviewed the triage vital signs and the nursing notes.  Pertinent labs & imaging results that were available during my care of the patient were reviewed by me and considered in my medical decision making (see chart for details).     Patient has not had any vomiting in the ED. Her chest/abdominal pain are likely from the vomiting. However have low suspicion for ACS, PE, dissection, or esophageal perforation. No  hematemesis. She has been seen in the ED multiple times in the past for vomiting and abdominal pain similar. She is able to tolerate oral fluids and potassium. Mild hypokalemia likely from vomiting. Discharge home with anti-emetics and follow-up with PCP. Discussed return precautions.  Final Clinical Impressions(s) / ED Diagnoses   Final diagnoses:  Acute vomiting  Hypokalemia    New Prescriptions Discharge Medication List as of 05/26/2017  1:13 PM    START taking these medications   Details  potassium chloride SA (K-DUR,KLOR-CON) 20 MEQ tablet Take 1 tablet (20 mEq total) by mouth daily., Starting Sun 05/26/2017, Print    promethazine (PHENERGAN) 25 MG tablet Take 1 tablet (25 mg total) by mouth every 6 (six) hours as needed for nausea or vomiting., Starting Sun 05/26/2017, Print         Sherwood Gambler, MD 05/26/17 1535

## 2017-05-29 MED FILL — POTASSIUM CL ER 20 MEQ TAB: 20 | 3 days supply | Qty: 3 | Fill #0

## 2017-05-29 MED FILL — PROMETHAZINE 25 MG TABLET: 25 | 2 days supply | Qty: 10 | Fill #0

## 2017-06-03 DIAGNOSIS — Z7952 Long term (current) use of systemic steroids: Secondary | ICD-10-CM | POA: Diagnosis not present

## 2017-06-03 DIAGNOSIS — R471 Dysarthria and anarthria: Secondary | ICD-10-CM | POA: Diagnosis not present

## 2017-06-03 DIAGNOSIS — Z79899 Other long term (current) drug therapy: Secondary | ICD-10-CM | POA: Diagnosis not present

## 2017-06-03 DIAGNOSIS — J849 Interstitial pulmonary disease, unspecified: Secondary | ICD-10-CM | POA: Diagnosis not present

## 2017-06-03 DIAGNOSIS — G903 Multi-system degeneration of the autonomic nervous system: Secondary | ICD-10-CM | POA: Diagnosis not present

## 2017-06-03 DIAGNOSIS — M25559 Pain in unspecified hip: Secondary | ICD-10-CM | POA: Diagnosis not present

## 2017-06-03 DIAGNOSIS — F329 Major depressive disorder, single episode, unspecified: Secondary | ICD-10-CM | POA: Diagnosis not present

## 2017-06-03 DIAGNOSIS — K219 Gastro-esophageal reflux disease without esophagitis: Secondary | ICD-10-CM | POA: Diagnosis not present

## 2017-06-03 DIAGNOSIS — R270 Ataxia, unspecified: Secondary | ICD-10-CM | POA: Diagnosis not present

## 2017-06-03 DIAGNOSIS — R112 Nausea with vomiting, unspecified: Secondary | ICD-10-CM | POA: Diagnosis not present

## 2017-06-03 DIAGNOSIS — D860 Sarcoidosis of lung: Secondary | ICD-10-CM | POA: Diagnosis not present

## 2017-06-03 DIAGNOSIS — R0602 Shortness of breath: Secondary | ICD-10-CM | POA: Diagnosis not present

## 2017-06-03 DIAGNOSIS — F419 Anxiety disorder, unspecified: Secondary | ICD-10-CM | POA: Diagnosis not present

## 2017-06-03 DIAGNOSIS — S8291XD Unspecified fracture of right lower leg, subsequent encounter for closed fracture with routine healing: Secondary | ICD-10-CM | POA: Diagnosis not present

## 2017-06-03 DIAGNOSIS — R29898 Other symptoms and signs involving the musculoskeletal system: Secondary | ICD-10-CM | POA: Diagnosis not present

## 2017-06-03 DIAGNOSIS — J45909 Unspecified asthma, uncomplicated: Secondary | ICD-10-CM | POA: Diagnosis not present

## 2017-06-03 DIAGNOSIS — Z2821 Immunization not carried out because of patient refusal: Secondary | ICD-10-CM | POA: Diagnosis not present

## 2017-06-03 DIAGNOSIS — E2740 Unspecified adrenocortical insufficiency: Secondary | ICD-10-CM | POA: Diagnosis not present

## 2017-06-13 DIAGNOSIS — D860 Sarcoidosis of lung: Secondary | ICD-10-CM | POA: Diagnosis not present

## 2017-06-14 ENCOUNTER — Encounter: Payer: Self-pay | Admitting: Family Medicine

## 2017-06-14 ENCOUNTER — Ambulatory Visit: Payer: 59 | Admitting: Family Medicine

## 2017-06-14 ENCOUNTER — Ambulatory Visit (INDEPENDENT_AMBULATORY_CARE_PROVIDER_SITE_OTHER): Payer: 59 | Admitting: Family Medicine

## 2017-06-14 VITALS — BP 118/72 | Temp 98.7°F | Ht 70.0 in | Wt 225.4 lb

## 2017-06-14 DIAGNOSIS — J209 Acute bronchitis, unspecified: Secondary | ICD-10-CM | POA: Diagnosis not present

## 2017-06-14 DIAGNOSIS — E2740 Unspecified adrenocortical insufficiency: Secondary | ICD-10-CM

## 2017-06-14 MED ORDER — PREDNISONE 10 MG PO TABS: ORAL_TABLET | ORAL | 0 refills | Status: DC

## 2017-06-14 MED ORDER — CEFDINIR 300 MG PO CAPS: 300.0000 mg | ORAL_CAPSULE | Freq: Two times a day (BID) | ORAL | 0 refills | Status: DC

## 2017-06-14 MED FILL — predniSONE 10 MG TABS: 10 | 16 days supply | Qty: 50 | Fill #0

## 2017-06-14 MED FILL — CEFDINIR 300 MG CAPSULE: 300 | 10 days supply | Qty: 20 | Fill #0

## 2017-06-14 MED FILL — LEVOTHYROXINE 75 MCG TABLET: 75 | 30 days supply | Qty: 30 | Fill #1

## 2017-06-14 NOTE — Progress Notes (Signed)
   Subjective:    Patient ID: Kathleen [name], female    DOB: 07-30-1961, 56 y.o.   MRN: [id number]  Cough  This is a new problem. The current episode started yesterday. Associated symptoms include nasal congestion and a sore throat.   Father had bronchitis and got sick  Was given a z pk  Pos exposure  Just yestfelt weak and congested and achey  Dim energy  sor throat and achey   istory reviewed at length. Patient has history of sarcoidosis. Also pulmonary fibrosis.  In fact has worsened in the past bad enough to lead to hospitalization in the ICU with respiratory failure. Review of Systems  HENT: Positive for sore throat.   Respiratory: Positive for cough.        Objective:   Physical Exam Alert, mild malaise. Hydration good Vitals stable. frontal/ maxillary tenderness evident positive nasal congestion. pharynx normal neck supple  lungs clear/no crackles , there are present faint expiratory wheezes. heart regular in rhythm        Assessment & Plan:  Impression rhinosinusitis/bronchitis/bronchitis likely post viral,complicated by patient's underlyingsarcoidosisand adrenal insufficiency discussion held. Will increase prednisone next 5 days. Patient to touch base with her endocrinologist early next week. Use albuterol regularly. discussed with patient. plan antibiotics prescribed. Questions answered. Symptomatic care discussed. warning signs discussed. Very long discussion heldregarding interventions and warning signsWSL  Greater than 50% of this 25 minute face to face visit was spent in counseling and discussion and coordination of care regarding the above diagnosis/diagnosies

## 2017-06-20 ENCOUNTER — Ambulatory Visit (INDEPENDENT_AMBULATORY_CARE_PROVIDER_SITE_OTHER): Payer: 59 | Admitting: Family Medicine

## 2017-06-20 ENCOUNTER — Emergency Department (HOSPITAL_COMMUNITY)
Admission: EM | Admit: 2017-06-20 | Discharge: 2017-06-20 | Disposition: A | Discharge status: Home / Self Care | Payer: 59 | Attending: Emergency Medicine | Admitting: Emergency Medicine

## 2017-06-20 ENCOUNTER — Emergency Department (HOSPITAL_COMMUNITY): Payer: 59

## 2017-06-20 ENCOUNTER — Encounter: Payer: Self-pay | Admitting: Family Medicine

## 2017-06-20 ENCOUNTER — Encounter (HOSPITAL_COMMUNITY): Payer: Self-pay

## 2017-06-20 VITALS — BP 126/72 | Temp 97.7°F | Ht 70.0 in | Wt 220.4 lb

## 2017-06-20 DIAGNOSIS — E039 Hypothyroidism, unspecified: Secondary | ICD-10-CM | POA: Diagnosis not present

## 2017-06-20 DIAGNOSIS — Z79899 Other long term (current) drug therapy: Secondary | ICD-10-CM | POA: Insufficient documentation

## 2017-06-20 DIAGNOSIS — J209 Acute bronchitis, unspecified: Secondary | ICD-10-CM | POA: Diagnosis not present

## 2017-06-20 DIAGNOSIS — I951 Orthostatic hypotension: Secondary | ICD-10-CM | POA: Diagnosis not present

## 2017-06-20 DIAGNOSIS — R05 Cough: Secondary | ICD-10-CM | POA: Diagnosis not present

## 2017-06-20 DIAGNOSIS — R51 Headache: Secondary | ICD-10-CM | POA: Diagnosis not present

## 2017-06-20 DIAGNOSIS — D649 Anemia, unspecified: Secondary | ICD-10-CM | POA: Diagnosis not present

## 2017-06-20 DIAGNOSIS — J40 Bronchitis, not specified as acute or chronic: Secondary | ICD-10-CM | POA: Diagnosis not present

## 2017-06-20 DIAGNOSIS — E2740 Unspecified adrenocortical insufficiency: Secondary | ICD-10-CM

## 2017-06-20 DIAGNOSIS — D869 Sarcoidosis, unspecified: Secondary | ICD-10-CM

## 2017-06-20 LAB — COMPREHENSIVE METABOLIC PANEL
ALT: 17 U/L (ref 14–54)
AST: 18 U/L (ref 15–41)
Albumin: 3.9 g/dL (ref 3.5–5.0)
Alkaline Phosphatase: 72 U/L (ref 38–126)
Anion gap: 10 (ref 5–15)
BUN: 11 mg/dL (ref 6–20)
CO2: 26 mmol/L (ref 22–32)
Calcium: 9.4 mg/dL (ref 8.9–10.3)
Chloride: 102 mmol/L (ref 101–111)
Creatinine, Ser: 0.96 mg/dL (ref 0.44–1.00)
GFR calc Af Amer: >60 mL/min (ref >60)
GFR calc non Af Amer: >60 mL/min (ref >60)
Glucose, Bld: 88 mg/dL (ref 65–99)
Potassium: 3.9 mmol/L (ref 3.5–5.1)
Sodium: 138 mmol/L (ref 135–145)
Total Bilirubin: 0.6 mg/dL (ref 0.3–1.2)
Total Protein: 7.4 g/dL (ref 6.5–8.1)

## 2017-06-20 LAB — CBC WITH DIFFERENTIAL/PLATELET
Basophils Absolute: 0 K/uL (ref 0.0–0.1)
Basophils Relative: 0 %
Eosinophils Absolute: 0.3 K/uL (ref 0.0–0.7)
Eosinophils Relative: 4 %
HCT: 38.2 % (ref 36.0–46.0)
Hemoglobin: 12.5 g/dL (ref 12.0–15.0)
Lymphocytes Relative: 10 %
Lymphs Abs: 0.8 K/uL (ref 0.7–4.0)
MCH: 29.3 pg (ref 26.0–34.0)
MCHC: 32.7 g/dL (ref 30.0–36.0)
MCV: 89.7 fL (ref 78.0–100.0)
Monocytes Absolute: 0.8 K/uL (ref 0.1–1.0)
Monocytes Relative: 10 %
Neutro Abs: 6.3 K/uL (ref 1.7–7.7)
Neutrophils Relative %: 76 %
Platelets: 338 K/uL (ref 150–400)
RBC: 4.26 MIL/uL (ref 3.87–5.11)
RDW: 14.3 % (ref 11.5–15.5)
WBC: 8.3 K/uL (ref 4.0–10.5)

## 2017-06-20 MED ORDER — GUAIFENESIN 100 MG/5ML PO SOLN
5.0000 mL | Freq: Once | ORAL | Status: AC
  Administered 2017-06-20: 100 mg via ORAL
  Filled 2017-06-20: qty 5

## 2017-06-20 MED ORDER — KETOROLAC TROMETHAMINE 30 MG/ML IJ SOLN
30.0000 mg | Freq: Once | INTRAMUSCULAR | Status: AC
  Administered 2017-06-20: 30 mg via INTRAVENOUS
  Filled 2017-06-20: qty 1

## 2017-06-20 MED ORDER — LEVOFLOXACIN 500 MG PO TABS: 500.0000 mg | ORAL_TABLET | Freq: Every day | ORAL | 0 refills | Status: DC

## 2017-06-20 MED ORDER — TRAMADOL HCL 50 MG PO TABS: 50.0000 mg | ORAL_TABLET | Freq: Four times a day (QID) | ORAL | 0 refills | Status: DC | PRN

## 2017-06-20 MED ORDER — SODIUM CHLORIDE 0.9 % IV BOLUS (SEPSIS)
1000.0000 mL | Freq: Once | INTRAVENOUS | Status: AC
  Administered 2017-06-20: 1000 mL via INTRAVENOUS

## 2017-06-20 NOTE — Progress Notes (Signed)
   Subjective:    Patient ID: Kathleen [name], female    DOB: Sep 15, 1960, 56 y.o.   MRN: [id number]  Cough  This is a new problem. The current episode started in the past 7 days. Associated symptoms include ear pain, headaches, myalgias, nasal congestion, rhinorrhea and a sore throat.   Patient was seen last week with significant cough congestion drainage was thought to have a flareup of her sarcoidosis as well as bronchitis issues she was put on antibiotics and prednisone taper She saw her specialist at [facility] just recently and they did do a CT scan of her chest which showed stable sarcoidosis  Patient does have a history of adrenal insufficiency she has been on prednisone for her pulmonary issues for years  She has had a history of dehydration with orthostatic hypotension when she gets sick States no other concerns this visit.   Review of Systems  HENT: Positive for ear pain, rhinorrhea and sore throat.   Respiratory: Positive for cough.   Musculoskeletal: Positive for myalgias.  Neurological: Positive for headaches.       Objective:   Physical Exam  Constitutional: She appears well-developed.  HENT:  Head: Normocephalic.  Right Ear: External ear normal.  Left Ear: External ear normal.  Nose: Nose normal.  Mouth/Throat: Oropharynx is clear and moist. No oropharyngeal exudate.  Eyes: Right eye exhibits no discharge. Left eye exhibits no discharge.  Neck: Neck supple. No tracheal deviation present.  Cardiovascular: Normal rate and normal heart sounds.   No murmur heard. Pulmonary/Chest: Effort normal and breath sounds normal. She has no wheezes. She has no rales.  Lymphadenopathy:    She has no cervical adenopathy.  Skin: Skin is warm and dry.  Nursing note and vitals reviewed.  Blood pressure 120/70 laying 110/64 sitting 108/60 standing Patient relates she feels awful she does have a deep cough but I do not hear any crackles  ER MD spoke with    Assessment & Plan:    Persistent bronchitis History of chronic lung disease Mild orthostatic hypotension Poor p.o. intake Continue antibiotics. Referral to the ER for further evaluation I believe patient will end up needing to have lab work x-rays and probable IV fluids We will provide follow-up visits upon release from ER

## 2017-06-20 NOTE — ED Triage Notes (Signed)
Reports of headache and diarrhea x2-3 days. Patient was sent by Dr. Wolfgang Phoenix for possible flu per staff.

## 2017-06-20 NOTE — ED Provider Notes (Signed)
[facility] EMERGENCY DEPARTMENT Provider Note   CSN: [id number] Arrival date & time: 06/20/17  1428     History   Chief Complaint Chief Complaint  Patient presents with  . Headache  . Diarrhea    HPI Kathleen [name] is a 56 y.o. female.  Patient complains of cough and headache.  Patient has sarcoid and has been on antibiotics for the last 5 days not getting better.  She has finished the antibiotics.   The history is provided by the patient. No language interpreter was used.  Cough  This is a recurrent problem. The current episode started more than 2 days ago. The problem occurs constantly. The problem has not changed since onset.The cough is non-productive. There has been no fever. Pertinent negatives include no chest pain and no headaches. Treatments tried: Antibiotics. The treatment provided no relief. She is not a smoker.    Past Medical History:  Diagnosis Date  . Adrenal insufficiency ([place])   . Anemia   . Avascular bone necrosis ([place])   . Breast fibrocystic disorder   . Chronic pain   . Fibromyalgia   . Gastroesophageal reflux disease   . Mitral valve prolapse   . Orthostatic hypotension   . Peripheral neuropathy   . Restrictive lung disease    Moderate to severe  . Sarcoidosis    Biopsy proven - [facility]  . SOB (shortness of breath)    chronic    Patient Active Problem List   Diagnosis Date Noted  . Personal history of noncompliance with medical treatment, presenting hazards to health 04/26/2017  . Hypothyroidism 01/15/2017  . Septic shock ([place])   . Elevated troponin   . Symptomatic bradycardia   . Acute respiratory failure with hypoxia ([place])   . Adrenal insufficiency ([place]) 08/17/2016  . Hypokalemia 08/17/2016  . Chronic pain syndrome 04/20/2016  . Vaginal bleeding 01/02/2016  . Acute blood loss anemia 12/31/2015  . Gastroenteritis   . Hypovolemic shock ([place]) 12/30/2015  . Nausea and vomiting 12/29/2015  . Hypotension 12/29/2015  . Dehydration  12/28/2015  . Constipation 11/14/2015  . Avascular necrosis of bone of left hip ([place]) 04/05/2015  . Bimalleolar fracture of right ankle 02/11/2015  . Post herpetic neuralgia 10/22/2014  . Hyperlipidemia 08/25/2014  . Obstructive sleep apnea 02/18/2014  . Obesity, unspecified 05/06/2013  . Fibromyalgia 02/11/2013  . Depression 02/11/2013  . Abdominal pain, other specified site 02/11/2013  . Shingles 02/02/2013  . Urinary retention 02/02/2013  . Pedal edema 01/22/2013  . Orthostatic hypotension 01/16/2013  . Disturbance of skin sensation 11/26/2012  . Pain in limb 11/26/2012  . Helicobacter pylori gastritis 05/23/2012  . Cough 05/02/2012  . Fibrocystic breast disease in female 04/09/2012  . DOE (dyspnea on exertion) 08/03/2011  . Anxiety state 10/19/2010  . CHEST PAIN 11/22/2008  . Sarcoidosis 06/21/2008    Past Surgical History:  Procedure Laterality Date  . BREAST LUMPECTOMY  01/12/2011   right  . TUBAL LIGATION      OB History    Gravida Para Term Preterm AB Living   1 1 1          SAB TAB Ectopic Multiple Live Births                   Home Medications    Prior to Admission medications   Medication Sig Start Date End Date Taking? Authorizing Provider  acetaminophen (TYLENOL) 325 MG tablet Take 2 tablets (650 mg total) by mouth every 6 (six) hours as  needed for mild pain (or Fever >/= 101). 08/21/16  Yes Corey Harold, NP  albuterol (PROVENTIL) (2.5 MG/3ML) 0.083% nebulizer solution Take 3 mLs (2.5 mg total) by nebulization every 2 (two) hours as needed for wheezing or shortness of breath. 02/07/17  Yes Luking, Scott A, MD  budesonide (PULMICORT) 0.5 MG/2ML nebulizer solution Take 0.5 mg by nebulization 2 (two) times daily as needed (for shortness of breath).  06/03/17  Yes [provider]  ferrous sulfate 325 (65 FE) MG EC tablet Take 325 mg by mouth daily with breakfast.   Yes [provider]  levothyroxine (SYNTHROID, LEVOTHROID) 75 MCG tablet Take  75 mcg by mouth daily before breakfast.  06/14/17  Yes [provider]  midodrine (PROAMATINE) 5 MG tablet Take 1 tablet (5 mg total) by mouth 2 (two) times daily with a meal. 05/21/17  Yes Luking, Elayne Snare, MD  oxyCODONE-acetaminophen (ROXICET) 5-325 MG tablet 1 bid prn pain Patient taking differently: Take 1 tablet by mouth 2 (two) times daily as needed for moderate pain. 1 bid prn pain 05/21/17  Yes Luking, Scott A, MD  pantoprazole (PROTONIX) 40 MG tablet TAKE 1 TABLET BY MOUTH ONCE DAILY 02/20/17  Yes Luking, Scott A, MD  potassium chloride SA (K-DUR,KLOR-CON) 20 MEQ tablet Take 1 tablet (20 mEq total) by mouth daily. 05/26/17  Yes Sherwood Gambler, MD  predniSONE (DELTASONE) 10 MG tablet Take 1 tablet (10 mg total) by mouth daily with breakfast. 10/16/16  Yes Nida, Marella Chimes, MD  promethazine (PHENERGAN) 25 MG tablet Take 1 tablet (25 mg total) by mouth every 6 (six) hours as needed for nausea or vomiting. 05/26/17  Yes Sherwood Gambler, MD  cefdinir (OMNICEF) 300 MG capsule Take 1 capsule (300 mg total) by mouth 2 (two) times daily. Patient not taking: Reported on 06/20/2017 06/14/17   Mikey Kirschner, MD  levofloxacin (LEVAQUIN) 500 MG tablet Take 1 tablet (500 mg total) by mouth daily. 06/20/17   Milton Ferguson, MD  ondansetron (ZOFRAN ODT) 4 MG disintegrating tablet Take 1 tablet (4 mg total) by mouth every 8 (eight) hours as needed for nausea or vomiting. Patient not taking: Reported on 06/20/2017 05/06/17   Francine Graven, DO  predniSONE (DELTASONE) 10 MG tablet 3 tablets a day for 5 days then further dose per endocrinology Patient not taking: Reported on 06/20/2017 06/14/17   Mikey Kirschner, MD  traZODone (DESYREL) 50 MG tablet Take 0.5-1 tablets (25-50 mg total) by mouth at bedtime as needed for sleep. Patient not taking: Reported on 06/20/2017 05/21/17   [name], MD    Family History Family History  Problem Relation Age of Onset  . Cardiomyopathy Mother   .  Breast cancer Mother   . Prostate cancer Father   . Neurofibromatosis Brother   . Heart disease Maternal Aunt   . Bipolar disorder Daughter   . Colon cancer Neg Hx     Social History Social History  Substance Use Topics  . Smoking status: Never Smoker  . Smokeless tobacco: Never Used  . Alcohol use No     Allergies   Bee venom; Other; Codeine; and Morphine and related   Review of Systems Review of Systems  Constitutional: Negative for appetite change and fatigue.  HENT: Negative for congestion, ear discharge and sinus pressure.   Eyes: Negative for discharge.  Respiratory: Positive for cough.   Cardiovascular: Negative for chest pain.  Gastrointestinal: Negative for abdominal pain and diarrhea.  Genitourinary: Negative for frequency and hematuria.  Musculoskeletal: Negative for back pain.  Skin: Negative for rash.  Neurological: Negative for seizures and headaches.  Psychiatric/Behavioral: Negative for hallucinations.     Physical Exam Updated Vital Signs BP 113/62   Pulse 69   Temp 97.8 F (36.6 C) (Oral)   Resp 18   Ht 5\' 10"  (1.778 m)   Wt 99.8 kg (220 lb)   SpO2 100%   BMI 31.57 kg/m   Physical Exam  Constitutional: She is oriented to person, place, and time. She appears well-developed.  HENT:  Head: Normocephalic.  Eyes: Conjunctivae and EOM are normal. No scleral icterus.  Neck: Neck supple. No thyromegaly present.  Cardiovascular: Normal rate and regular rhythm.  Exam reveals no gallop and no friction rub.   No murmur heard. Pulmonary/Chest: No stridor. She has no wheezes. She has no rales. She exhibits no tenderness.  Abdominal: She exhibits no distension. There is no tenderness. There is no rebound.  Musculoskeletal: Normal range of motion. She exhibits no edema.  Lymphadenopathy:    She has no cervical adenopathy.  Neurological: She is oriented to person, place, and time. She exhibits normal muscle tone. Coordination normal.  Skin: No rash  noted. No erythema.  Psychiatric: She has a normal mood and affect. Her behavior is normal.     ED Treatments / Results  Labs (all labs ordered are listed, but only abnormal results are displayed) Labs Reviewed  CBC WITH DIFFERENTIAL/PLATELET  COMPREHENSIVE METABOLIC PANEL    EKG  EKG Interpretation None       Radiology Dg Chest 2 View  Result Date: 06/20/2017 CLINICAL DATA:  Patient with mildly productive cough. Shortness of breath. EXAM: CHEST  2 VIEW COMPARISON:  Chest radiograph 05/26/2017. FINDINGS: Stable cardiac and mediastinal contours. Bilateral hazy pulmonary opacities. No definite pleural effusion or pneumothorax. Regional skeleton is unremarkable. IMPRESSION: Interval development of bilateral perihilar hazy pulmonary opacities concerning for edema in the appropriate clinical setting. Infection could have a similar appearance. Electronically Signed   By: Lovey Newcomer M.D.   On: 06/20/2017 16:33    Procedures Procedures (including critical care time)  Medications Ordered in ED Medications  sodium chloride 0.9 % bolus 1,000 mL (0 mLs Intravenous Stopped 06/20/17 1751)  ketorolac (TORADOL) 30 MG/ML injection 30 mg (30 mg Intravenous Given 06/20/17 1549)     Initial Impression / Assessment and Plan / ED Course  I have reviewed the triage vital signs and the nursing notes.  Pertinent labs & imaging results that were available during my care of the patient were reviewed by me and considered in my medical decision making (see chart for details).   Patient with sarcoid and persistent cough.  She has just increased her prednisone.  We will place her on some antibiotics for possible infection and have her follow-up next week    Final Clinical Impressions(s) / ED Diagnoses   Final diagnoses:  Bronchitis    New Prescriptions New Prescriptions   LEVOFLOXACIN (LEVAQUIN) 500 MG TABLET    Take 1 tablet (500 mg total) by mouth daily.     Milton Ferguson, MD 06/20/17  [phone]

## 2017-06-20 NOTE — ED Notes (Signed)
IV started and would not thread all of the way but returned blood good and flushes well.  Fluids infusing.  Pt c/o pain at site, but no swelling or blanching noted.  Offered to move IV to another location, but pt declined.

## 2017-06-20 NOTE — Discharge Instructions (Signed)
Follow up next week if not improving. °

## 2017-06-21 MED FILL — levoFLOXacin 500 MG TABS: 500 | 7 days supply | Qty: 7 | Fill #0

## 2017-06-21 MED FILL — traMADol HCL 50 MG TABS: 50 | 5 days supply | Qty: 20 | Fill #0

## 2017-06-26 MED FILL — OXYCOD/ACETAMINOPHEN 5-325M: 5-325 | 30 days supply | Qty: 60 | Fill #0

## 2017-07-22 MED FILL — OXYCOD/ACETAMINOPHEN 5-325M: 5-325 | 30 days supply | Qty: 60 | Fill #0

## 2017-08-01 ENCOUNTER — Other Ambulatory Visit: Payer: Self-pay | Admitting: Family Medicine

## 2017-08-01 MED FILL — MIDODRINE HCL 5 MG TABLET: 5 | 30 days supply | Qty: 60 | Fill #1

## 2017-08-01 MED FILL — PANTOPRAZOLE SOD DR 40 MG T: 40 | 90 days supply | Qty: 90 | Fill #2

## 2017-08-01 MED FILL — LEVOTHYROXINE 75 MCG TABLET: 75 | 30 days supply | Qty: 30 | Fill #2

## 2017-08-01 NOTE — Telephone Encounter (Signed)
This prescription should be per Dr. need please send back to pharmacy have them forwarded to Dr. Dorris Fetch

## 2017-08-05 MED FILL — predniSONE 10 MG TABS: 10 | 30 days supply | Qty: 30 | Fill #7

## 2017-08-07 ENCOUNTER — Encounter: Payer: Self-pay | Admitting: Family Medicine

## 2017-08-07 ENCOUNTER — Ambulatory Visit (INDEPENDENT_AMBULATORY_CARE_PROVIDER_SITE_OTHER): Payer: 59 | Admitting: Family Medicine

## 2017-08-07 VITALS — BP 104/80 | HR 91 | Temp 98.0°F | Ht 70.0 in | Wt 228.0 lb

## 2017-08-07 DIAGNOSIS — I951 Orthostatic hypotension: Secondary | ICD-10-CM | POA: Diagnosis not present

## 2017-08-07 DIAGNOSIS — R5383 Other fatigue: Secondary | ICD-10-CM | POA: Diagnosis not present

## 2017-08-07 LAB — POCT HEMOGLOBIN: Hemoglobin: 13.4 g/dL (ref 12.2–16.2)

## 2017-08-07 NOTE — Progress Notes (Signed)
   Subjective:    Patient ID: Kathleen [name], female    DOB: 04-09-1961, 56 y.o.   MRN: [id number]  HPI Patient is here today with complaints of feeling dizzy and weak for the last two days. Results for orders placed or performed in visit on 08/07/17  POCT hemoglobin  Result Value Ref Range   Hemoglobin 13.4 12.2 - 16.2 g/dL   Patient has history of orthostatic hypotension Has a history of adrenal insufficiency Is on prednisone regular basis Also is on medication to boost her blood pressure she takes this medicine at 9 AM and 3 PM Orthostatics done. Review of Systems Denies headache nausea vomiting fever chills sweats    Objective:   Physical Exam  Lungs clear heart regular HEENT benign Blood pressure was checked lying and sitting and standing by myself overall good     Assessment & Plan:  I believe patient having mild orthostasis but none currently I recommend for the patient to follow-up if ongoing troubles I recommend that she takes her medication at 9 AM and closer to 4 or 5 PM  Keep follow-up in several weeks I do not find any evidence of infection or adrenal crisis I do not recommend any lab work currently

## 2017-08-08 ENCOUNTER — Encounter (HOSPITAL_COMMUNITY): Payer: Self-pay | Admitting: Emergency Medicine

## 2017-08-08 ENCOUNTER — Emergency Department (HOSPITAL_COMMUNITY): Payer: 59

## 2017-08-08 ENCOUNTER — Emergency Department (HOSPITAL_COMMUNITY)
Admission: EM | Admit: 2017-08-08 | Discharge: 2017-08-09 | Disposition: A | Discharge status: Home / Self Care | Payer: 59 | Attending: Emergency Medicine | Admitting: Emergency Medicine

## 2017-08-08 DIAGNOSIS — J449 Chronic obstructive pulmonary disease, unspecified: Secondary | ICD-10-CM | POA: Insufficient documentation

## 2017-08-08 DIAGNOSIS — R079 Chest pain, unspecified: Secondary | ICD-10-CM | POA: Insufficient documentation

## 2017-08-08 DIAGNOSIS — R0789 Other chest pain: Secondary | ICD-10-CM | POA: Diagnosis not present

## 2017-08-08 DIAGNOSIS — Z79899 Other long term (current) drug therapy: Secondary | ICD-10-CM | POA: Diagnosis not present

## 2017-08-08 LAB — TROPONIN I: Troponin I: <0.03 ng/mL (ref <0.03)

## 2017-08-08 LAB — CBC WITH DIFFERENTIAL/PLATELET
Basophils Absolute: 0 10*3/uL (ref 0.0–0.1)
Basophils Relative: 0 %
Eosinophils Absolute: 0.1 10*3/uL (ref 0.0–0.7)
Eosinophils Relative: 1 %
HCT: 39.8 % (ref 36.0–46.0)
Hemoglobin: 12.8 g/dL (ref 12.0–15.0)
Lymphocytes Relative: 8 %
Lymphs Abs: 0.7 10*3/uL (ref 0.7–4.0)
MCH: 28.5 pg (ref 26.0–34.0)
MCHC: 32.2 g/dL (ref 30.0–36.0)
MCV: 88.6 fL (ref 78.0–100.0)
Monocytes Absolute: 0.3 10*3/uL (ref 0.1–1.0)
Monocytes Relative: 4 %
Neutro Abs: 7.8 10*3/uL — ABNORMAL HIGH (ref 1.7–7.7)
Neutrophils Relative %: 87 %
Platelets: 387 10*3/uL (ref 150–400)
RBC: 4.49 MIL/uL (ref 3.87–5.11)
RDW: 14.2 % (ref 11.5–15.5)
WBC: 8.9 10*3/uL (ref 4.0–10.5)

## 2017-08-08 LAB — BASIC METABOLIC PANEL
Anion gap: 11 (ref 5–15)
BUN: 16 mg/dL (ref 6–20)
CO2: 23 mmol/L (ref 22–32)
Calcium: 9.7 mg/dL (ref 8.9–10.3)
Chloride: 104 mmol/L (ref 101–111)
Creatinine, Ser: 0.91 mg/dL (ref 0.44–1.00)
GFR calc Af Amer: >60 mL/min (ref >60)
GFR calc non Af Amer: >60 mL/min (ref >60)
Glucose, Bld: 98 mg/dL (ref 65–99)
Potassium: 4 mmol/L (ref 3.5–5.1)
Sodium: 138 mmol/L (ref 135–145)

## 2017-08-08 NOTE — ED Notes (Signed)
Pt given diet coke with EDP approval. Pt tolerating well at this time.

## 2017-08-08 NOTE — ED Notes (Signed)
EDP at bedside  

## 2017-08-08 NOTE — ED Notes (Signed)
Pt transported to xray 

## 2017-08-08 NOTE — ED Provider Notes (Signed)
Results for orders placed or performed during the hospital encounter of 08/08/17  CBC with Differential  Result Value Ref Range   WBC 8.9 4.0 - 10.5 K/uL   RBC 4.49 3.87 - 5.11 MIL/uL   Hemoglobin 12.8 12.0 - 15.0 g/dL   HCT 39.8 36.0 - 46.0 %   MCV 88.6 78.0 - 100.0 fL   MCH 28.5 26.0 - 34.0 pg   MCHC 32.2 30.0 - 36.0 g/dL   RDW 14.2 11.5 - 15.5 %   Platelets 387 150 - 400 K/uL   Neutrophils Relative % 87 %   Neutro Abs 7.8 (H) 1.7 - 7.7 K/uL   Lymphocytes Relative 8 %   Lymphs Abs 0.7 0.7 - 4.0 K/uL   Monocytes Relative 4 %   Monocytes Absolute 0.3 0.1 - 1.0 K/uL   Eosinophils Relative 1 %   Eosinophils Absolute 0.1 0.0 - 0.7 K/uL   Basophils Relative 0 %   Basophils Absolute 0.0 0.0 - 0.1 K/uL  Basic metabolic panel  Result Value Ref Range   Sodium 138 135 - 145 mmol/L   Potassium 4.0 3.5 - 5.1 mmol/L   Chloride 104 101 - 111 mmol/L   CO2 23 22 - 32 mmol/L   Glucose, Bld 98 65 - 99 mg/dL   BUN 16 6 - 20 mg/dL   Creatinine, Ser 0.91 0.44 - 1.00 mg/dL   Calcium 9.7 8.9 - 10.3 mg/dL   GFR calc non Af Amer >60 >60 mL/min   GFR calc Af Amer >60 >60 mL/min   Anion gap 11 5 - 15  Troponin I  Result Value Ref Range   Troponin I <0.03 <0.03 ng/mL   Dg Chest 2 View  Result Date: 08/08/2017 CLINICAL DATA:  Mid to left-sided chest pain. History restrictive lung disease and sarcoidosis. EXAM: CHEST  2 VIEW COMPARISON:  06/20/2017 FINDINGS: The heart size and mediastinal contours are stable and unchanged in appearance. Mild streaky parenchymal opacities are noted about the lungs bilaterally, chronic in appearance and may reflect chronic interstitial change possibly areas of mild fibrosis. No alveolar consolidation or CHF. No effusion or pneumothorax. The visualized skeletal structures are unremarkable. IMPRESSION: Chronic mild interstitial prominence which may reflect stigmata of interstitial lung disease and possibly areas of mild fibrosis. No active pulmonary disease. Electronically  Signed   By: Ashley Royalty M.D.   On: 08/08/2017 22:08   Patient turned over to me with labs pending.  Labs without any significant abnormalities.  Patient has a history of pulmonary fibrosis followed by Dr. Liborio Nixon and has a pulmonary specialist to [facility].  Workup for the intermittent chest pain lasting less than 5 minutes throughout today or this evening without any acute findings.  Patient without any significant hypoxia.  Or tachycardia.  Recommend follow-up with cardiology and continuing current meds.  Recommend patient return for chest pain lasting 15 minutes or longer.   Fredia Sorrow, MD 08/08/17 [phone]

## 2017-08-08 NOTE — ED Provider Notes (Signed)
[facility] EMERGENCY DEPARTMENT Provider Note   CSN: [id number] Arrival date & time: 08/08/17  2102     History   Chief Complaint Chief Complaint  Patient presents with  . Chest Pain    HPI Kathleen [name] is a 56 y.o. female.  She complains of intermittent chest pain, several times yesterday and several times today.  The pain lasts about 1 minute in his central chest and resolve spontaneously.  There is no associated nausea, vomiting, diaphoresis, weakness or dizziness.  She has noticed some pain also in the middle of her back which comes and goes.  She denies fever, cough, shortness of breath, focal weakness or paresthesia.  She has not have chronic chest pain or known prior cardiac disease.  Her pulmonary status has been relatively stable and she is taking her usual medications as directed.  There are no other known modifying factors.    HPI  Past Medical History:  Diagnosis Date  . Adrenal insufficiency ([place])   . Anemia   . Avascular bone necrosis ([place])   . Breast fibrocystic disorder   . Chronic pain   . Fibromyalgia   . Gastroesophageal reflux disease   . Mitral valve prolapse   . Orthostatic hypotension   . Peripheral neuropathy   . Restrictive lung disease    Moderate to severe  . Sarcoidosis    Biopsy proven - [facility]  . SOB (shortness of breath)    chronic    Patient Active Problem List   Diagnosis Date Noted  . Personal history of noncompliance with medical treatment, presenting hazards to health 04/26/2017  . Hypothyroidism 01/15/2017  . Septic shock ([place])   . Elevated troponin   . Symptomatic bradycardia   . Acute respiratory failure with hypoxia ([place])   . Adrenal insufficiency ([place]) 08/17/2016  . Hypokalemia 08/17/2016  . Chronic pain syndrome 04/20/2016  . Vaginal bleeding 01/02/2016  . Acute blood loss anemia 12/31/2015  . Gastroenteritis   . Hypovolemic shock ([place]) 12/30/2015  . Nausea and vomiting 12/29/2015  . Hypotension 12/29/2015  .  Dehydration 12/28/2015  . Constipation 11/14/2015  . Avascular necrosis of bone of left hip ([place]) 04/05/2015  . Bimalleolar fracture of right ankle 02/11/2015  . Post herpetic neuralgia 10/22/2014  . Hyperlipidemia 08/25/2014  . Obstructive sleep apnea 02/18/2014  . Obesity, unspecified 05/06/2013  . Fibromyalgia 02/11/2013  . Depression 02/11/2013  . Abdominal pain, other specified site 02/11/2013  . Shingles 02/02/2013  . Urinary retention 02/02/2013  . Pedal edema 01/22/2013  . Orthostatic hypotension 01/16/2013  . Disturbance of skin sensation 11/26/2012  . Pain in limb 11/26/2012  . Helicobacter pylori gastritis 05/23/2012  . Cough 05/02/2012  . Fibrocystic breast disease in female 04/09/2012  . DOE (dyspnea on exertion) 08/03/2011  . Anxiety state 10/19/2010  . CHEST PAIN 11/22/2008  . Sarcoidosis 06/21/2008    Past Surgical History:  Procedure Laterality Date  . BREAST LUMPECTOMY  01/12/2011   right  . TUBAL LIGATION      OB History    Gravida Para Term Preterm AB Living   1 1 1          SAB TAB Ectopic Multiple Live Births                   Home Medications    Prior to Admission medications   Medication Sig Start Date End Date Taking? Authorizing Provider  acetaminophen (TYLENOL) 325 MG tablet Take 2 tablets (650 mg total) by mouth every  6 (six) hours as needed for mild pain (or Fever >/= 101). 08/21/16   Corey Harold, NP  albuterol (PROVENTIL) (2.5 MG/3ML) 0.083% nebulizer solution Take 3 mLs (2.5 mg total) by nebulization every 2 (two) hours as needed for wheezing or shortness of breath. 02/07/17   [name], MD  budesonide (PULMICORT) 0.5 MG/2ML nebulizer solution Take 0.5 mg by nebulization 2 (two) times daily as needed (for shortness of breath).  06/03/17   [provider]  ferrous sulfate 325 (65 FE) MG EC tablet Take 325 mg by mouth daily with breakfast.    [provider]  levothyroxine (SYNTHROID, LEVOTHROID) 75 MCG tablet Take  75 mcg by mouth daily before breakfast.  06/14/17   [provider]  midodrine (PROAMATINE) 5 MG tablet Take 1 tablet (5 mg total) by mouth 2 (two) times daily with a meal. 05/21/17   Luking, Elayne Snare, MD  ondansetron (ZOFRAN ODT) 4 MG disintegrating tablet Take 1 tablet (4 mg total) by mouth every 8 (eight) hours as needed for nausea or vomiting. 05/06/17   Francine Graven, DO  oxyCODONE-acetaminophen (ROXICET) 5-325 MG tablet 1 bid prn pain Patient taking differently: Take 1 tablet by mouth 2 (two) times daily as needed for moderate pain. 1 bid prn pain 05/21/17   [name], MD  pantoprazole (PROTONIX) 40 MG tablet TAKE 1 TABLET BY MOUTH ONCE DAILY 02/20/17   [name], MD  potassium chloride SA (K-DUR,KLOR-CON) 20 MEQ tablet Take 1 tablet (20 mEq total) by mouth daily. 05/26/17   Sherwood Gambler, MD  predniSONE (DELTASONE) 10 MG tablet Take 1 tablet (10 mg total) by mouth daily with breakfast. 10/16/16   [name], MD  predniSONE (DELTASONE) 10 MG tablet 3 tablets a day for 5 days then further dose per endocrinology 06/14/17   Mikey Kirschner, MD  promethazine (PHENERGAN) 25 MG tablet Take 1 tablet (25 mg total) by mouth every 6 (six) hours as needed for nausea or vomiting. 05/26/17   Sherwood Gambler, MD  traMADol (ULTRAM) 50 MG tablet Take 1 tablet (50 mg total) by mouth every 6 (six) hours as needed. 06/20/17   Milton Ferguson, MD  traZODone (DESYREL) 50 MG tablet Take 0.5-1 tablets (25-50 mg total) by mouth at bedtime as needed for sleep. 05/21/17   [name], MD    Family History Family History  Problem Relation Age of Onset  . Cardiomyopathy Mother   . Breast cancer Mother   . Prostate cancer Father   . Neurofibromatosis Brother   . Heart disease Maternal Aunt   . Bipolar disorder Daughter   . Colon cancer Neg Hx     Social History Social History   Tobacco Use  . Smoking status: Never Smoker  . Smokeless tobacco: Never Used  Substance Use  Topics  . Alcohol use: No  . Drug use: No     Allergies   Bee venom; Other; Codeine; and Morphine and related   Review of Systems Review of Systems  All other systems reviewed and are negative.    Physical Exam Updated Vital Signs BP 108/73   Pulse 87   Temp 97.8 F (36.6 C) (Oral)   Resp (!) 22   Ht 5\' 10"  (1.778 m)   Wt 99.8 kg (220 lb)   SpO2 95%   BMI 31.57 kg/m   Physical Exam  Constitutional: She is oriented to person, place, and time. She appears well-developed and well-nourished. She does not appear ill.  HENT:  Head: Normocephalic and atraumatic.  Eyes: Conjunctivae and EOM are normal. Pupils are equal, round, and reactive to light.  Neck: Normal range of motion and phonation normal. Neck supple.  Cardiovascular: Normal rate and regular rhythm.  Pulmonary/Chest: Effort normal and breath sounds normal. No accessory muscle usage or stridor. No tachypnea. No respiratory distress. She exhibits no tenderness.  Abdominal: Soft. She exhibits no distension. There is no tenderness. There is no guarding.  Musculoskeletal: Normal range of motion.       Right lower leg: She exhibits no edema.       Left lower leg: She exhibits no edema.  No chest wall tenderness.  Legs nontender to palpation.  Neurological: She is alert and oriented to person, place, and time. She exhibits normal muscle tone.  Skin: Skin is warm and dry.  Psychiatric: She has a normal mood and affect. Her behavior is normal. Judgment and thought content normal.  Nursing note and vitals reviewed.    ED Treatments / Results  Labs (all labs ordered are listed, but only abnormal results are displayed) Labs Reviewed  CBC WITH DIFFERENTIAL/PLATELET - Abnormal; Notable for the following components:      Result Value   Neutro Abs 7.8 (*)    All other components within normal limits  BASIC METABOLIC PANEL  TROPONIN I    EKG  EKG Interpretation  Date/Time:  Thursday August 08 2017 21:16:22  EST Ventricular Rate:  92 PR Interval:    QRS Duration: 79 QT Interval:  357 QTC Calculation: 442 R Axis:   71 Text Interpretation:  Sinus rhythm Nonspecific T abnormalities, diffuse leads since last tracing no significant change Confirmed by Daleen Bo [phone]) on 08/08/2017 9:29:26 PM       Radiology No results found.  Procedures Procedures (including critical care time)  Medications Ordered in ED Medications - No data to display   Initial Impression / Assessment and Plan / ED Course  I have reviewed the triage vital signs and the nursing notes.  Pertinent labs & imaging results that were available during my care of the patient were reviewed by me and considered in my medical decision making (see chart for details).      Patient Vitals for the past 24 hrs:  BP Temp Temp src Pulse Resp SpO2 Height Weight  08/08/17 2130 108/73 - - 87 (!) 22 95 % - -  08/08/17 2118 118/76 - - 92 20 95 % - -  08/08/17 2113 - 97.8 F (36.6 C) Oral - - - - -  08/08/17 2110 - - - - - - 5\' 10"  (1.778 m) 99.8 kg (220 lb)   Care to Dr. Rogene Houston to evaluate after return of chest x-ray results, troponin and bmet.   Final Clinical Impressions(s) / ED Diagnoses   Final diagnoses:  Nonspecific chest pain   Nonspecific chest pain with reassuring EKG.  Doubt ACS or PE.  Labs pending at time of turnover.  Patient is low risk for coronary disease.  The chest discomfort is noncardiac in nature.  Nursing Notes Reviewed/ Care Coordinated Applicable Imaging Reviewed Interpretation of Laboratory Data incorporated into ED treatment  Plan disposition per oncoming provider team  ED Discharge Orders    None       Daleen Bo, MD 08/08/17 2209

## 2017-08-08 NOTE — Discharge Instructions (Signed)
Workup for the chest pain without acute findings.  Return for any chest pain lasting 15 minutes or longer.  Make an appointment to follow-up with your primary care doctor.  Continue all your current meds.  Also referral information to cardiology provided.  Would recommend cardiology follow-up for further evaluation is very possible all symptoms are related to your pulmonary disease.

## 2017-08-08 NOTE — ED Notes (Signed)
Pt returned from xray

## 2017-08-08 NOTE — ED Triage Notes (Addendum)
CP under lt breast x 2 days. Today pt also having back pain in the middle of back.  States back started hurting worse after she ate dinner at [employer] tonight.

## 2017-08-09 ENCOUNTER — Other Ambulatory Visit: Payer: Self-pay | Admitting: *Deleted

## 2017-08-09 ENCOUNTER — Other Ambulatory Visit: Payer: Self-pay | Admitting: "Endocrinology

## 2017-08-09 ENCOUNTER — Ambulatory Visit (INDEPENDENT_AMBULATORY_CARE_PROVIDER_SITE_OTHER): Payer: 59 | Admitting: Neurology

## 2017-08-09 DIAGNOSIS — E039 Hypothyroidism, unspecified: Secondary | ICD-10-CM | POA: Diagnosis not present

## 2017-08-09 DIAGNOSIS — R200 Anesthesia of skin: Secondary | ICD-10-CM

## 2017-08-09 DIAGNOSIS — E2740 Unspecified adrenocortical insufficiency: Secondary | ICD-10-CM | POA: Diagnosis not present

## 2017-08-09 NOTE — Procedures (Signed)
[facility]  [place], [address]  [place], [place] [address] Tel: [phone] Fax:  [phone] Test Date:  08/09/2017  Patient: Kathleen [name] DOB: [birth year] Physician: Narda Amber, DO  Sex: Female Height: 5\' 10"  Ref Phys: Sallee Lange, MD  ID#: [id number] Temp: 34.2 Technician:    Patient Complaints: This is a 56 year old female referred for evaluation of bilateral lower leg pain and paresthesias.  NCV & EMG Findings: Electrodiagnostic testing was prematurely terminated at patient's request due to pain and intolerance of testing. Findings are limited to only the right right sural and superficial peroneal sensory responses which are within normal limits.   Impression: There is no evidence of a sensory polyneuropathy in the right lower extremity. No other meaningful conclusions can be made from the study, as it was prematurely terminated due to pain.   ___________________________ Narda Amber, DO    Nerve Conduction Studies Anti Sensory Summary Table   Site NR Peak (ms) Norm Peak (ms) P-T Amp (V) Norm P-T Amp  Right Sup Peroneal Anti Sensory (Ant Lat Mall)  34.2C  12 cm    2.9 <4.6 6.1 >4  Right Sural Anti Sensory (Lat Mall)  34.2C  Calf    3.0 <4.6 5.9 >4       Waveforms:

## 2017-08-10 LAB — RENAL FUNCTION PANEL
Albumin: 4.3 g/dL (ref 3.5–5.5)
BUN/Creatinine Ratio: 14 (ref 9–23)
BUN: 14 mg/dL (ref 6–24)
CO2: 24 mmol/L (ref 20–29)
Calcium: 9.9 mg/dL (ref 8.7–10.2)
Chloride: 101 mmol/L (ref 96–106)
Creatinine, Ser: 0.97 mg/dL (ref 0.57–1.00)
GFR calc Af Amer: 77 mL/min/{1.73_m2} (ref >59)
GFR calc non Af Amer: 66 mL/min/{1.73_m2} (ref >59)
Glucose: 89 mg/dL (ref 65–99)
Phosphorus: 4.2 mg/dL (ref 2.5–4.5)
Potassium: 3.9 mmol/L (ref 3.5–5.2)
Sodium: 142 mmol/L (ref 134–144)

## 2017-08-10 LAB — T4, FREE: Free T4: 1.38 ng/dL (ref 0.82–1.77)

## 2017-08-10 LAB — THYROID PEROXIDASE ANTIBODY: Thyroperoxidase Ab SerPl-aCnc: 15 IU/mL (ref 0–34)

## 2017-08-10 LAB — THYROGLOBULIN ANTIBODY: Thyroglobulin Antibody: <1 IU/mL (ref 0.0–0.9)

## 2017-08-10 LAB — TSH: TSH: 0.545 u[IU]/mL (ref 0.450–4.500)

## 2017-08-22 ENCOUNTER — Ambulatory Visit (INDEPENDENT_AMBULATORY_CARE_PROVIDER_SITE_OTHER): Payer: 59 | Admitting: Family Medicine

## 2017-08-22 VITALS — BP 106/68 | Ht 70.0 in | Wt 226.8 lb

## 2017-08-22 DIAGNOSIS — E785 Hyperlipidemia, unspecified: Secondary | ICD-10-CM

## 2017-08-22 DIAGNOSIS — M1612 Unilateral primary osteoarthritis, left hip: Secondary | ICD-10-CM

## 2017-08-22 MED ORDER — OXYCODONE-ACETAMINOPHEN 5-325 MG PO TABS: ORAL_TABLET | ORAL | 0 refills | Status: DC

## 2017-08-22 MED FILL — OXYCOD/ACETAMINOPHEN 5-325M: 5-325 | 30 days supply | Qty: 90 | Fill #0

## 2017-08-22 NOTE — Progress Notes (Signed)
   Subjective:    Patient ID: Kathleen [name], female    DOB: Jan 01, 1961, 56 y.o.   MRN: [id number]  HPI  Patient arrives with ongoing problems with her left hip. Patient has arthritis in her hip along with avascular necrosis she is under the care of pulmonology for her pulmonary fibrosis in addition to this her specialist set her up with orthopedics which told her that they would not recommend surgery because of the risk of infection although this was 1 year ago so it is possible they have changed her mind she is no longer on Imuran she is on prednisone  This patient was seen today for chronic pain She relates compliance The medication list was reviewed and updated.   -Compliance with medication: She relates compliance  - Number patient states they take daily: Takes 2/day  -when was the last dose patient took?  Took one earlier today  The patient was advised the importance of maintaining medication and not using illegal substances with these.  Refills needed: Does need refills  The patient was educated that we can provide 3 monthly scripts for their medication, it is their responsibility to follow the instructions.  Side effects or complications from medications: Denies side effects  Patient is aware that pain medications are meant to minimize the severity of the pain to allow their pain levels to improve to allow for better function. They are aware of that pain medications cannot totally remove their pain.  Due for UDT ( at least once per year) : This is done yearly  The patient does relate that the pain medicine is not as effective because of the level of her pain we talked about various options regarding possibly increasing the strength of the medicine or the frequency      Review of Systems  Constitutional: Negative for activity change and appetite change.  HENT: Negative for congestion.   Respiratory: Negative for cough.   Cardiovascular: Negative for chest pain.    Gastrointestinal: Negative for abdominal pain and vomiting.  Skin: Negative for color change.  Neurological: Negative for weakness.  Psychiatric/Behavioral: Negative for confusion.       Objective:   Physical Exam  Constitutional: She appears well-nourished. No distress.  HENT:  Head: Normocephalic.  Right Ear: External ear normal.  Left Ear: External ear normal.  Eyes: Right eye exhibits no discharge. Left eye exhibits no discharge.  Neck: No tracheal deviation present.  Cardiovascular: Normal rate, regular rhythm and normal heart sounds.  No murmur heard. Pulmonary/Chest: Effort normal and breath sounds normal. No respiratory distress. She has no wheezes. She has no rales.  Musculoskeletal: She exhibits no edema.  Lymphadenopathy:    She has no cervical adenopathy.  Neurological: She is alert.  Psychiatric: Her behavior is normal.  Vitals reviewed.         Assessment & Plan:  Chronic pain-3 prescriptions given See discussion above Increase the frequency of the medicine to 3 times per day Follow-up in 1 month Depending on how she is doing either stick with that dosing or may have to increase his strength of the medicine Certainly if the patient is doing better we can issue additional prescriptions then see her back in 3 months rather than 4 weeks

## 2017-09-02 ENCOUNTER — Ambulatory Visit (INDEPENDENT_AMBULATORY_CARE_PROVIDER_SITE_OTHER): Payer: 59 | Admitting: "Endocrinology

## 2017-09-02 ENCOUNTER — Encounter: Payer: Self-pay | Admitting: "Endocrinology

## 2017-09-02 VITALS — BP 127/80 | HR 88 | Ht 70.0 in | Wt 226.0 lb

## 2017-09-02 DIAGNOSIS — E785 Hyperlipidemia, unspecified: Secondary | ICD-10-CM | POA: Diagnosis not present

## 2017-09-02 DIAGNOSIS — E2740 Unspecified adrenocortical insufficiency: Secondary | ICD-10-CM | POA: Diagnosis not present

## 2017-09-02 DIAGNOSIS — E039 Hypothyroidism, unspecified: Secondary | ICD-10-CM | POA: Diagnosis not present

## 2017-09-02 DIAGNOSIS — Z6832 Body mass index (BMI) 32.0-32.9, adult: Secondary | ICD-10-CM

## 2017-09-02 DIAGNOSIS — E6609 Other obesity due to excess calories: Secondary | ICD-10-CM | POA: Diagnosis not present

## 2017-09-02 MED ORDER — LEVOTHYROXINE SODIUM 75 MCG PO TABS: 75.0000 ug | ORAL_TABLET | Freq: Every day | ORAL | 6 refills | Status: DC

## 2017-09-02 MED ORDER — PREDNISONE 10 MG PO TABS: 10.0000 mg | ORAL_TABLET | Freq: Every day | ORAL | 12 refills | Status: DC

## 2017-09-02 MED FILL — LEVOTHYROXINE 75 MCG TABLET: 75 | 30 days supply | Qty: 30 | Fill #3

## 2017-09-02 MED FILL — predniSONE 10 MG TABS: 10 | 30 days supply | Qty: 30 | Fill #8

## 2017-09-02 NOTE — Progress Notes (Signed)
Subjective:    Patient ID: Kathleen [name], female    DOB: [birth year], PCP [name], MD   Past Medical History:  Diagnosis Date  . Adrenal insufficiency ([place])   . Anemia   . Avascular bone necrosis ([place])   . Breast fibrocystic disorder   . Chronic pain   . Fibromyalgia   . Gastroesophageal reflux disease   . Mitral valve prolapse   . Orthostatic hypotension   . Peripheral neuropathy   . Restrictive lung disease    Moderate to severe  . Sarcoidosis    Biopsy proven - [facility]  . SOB (shortness of breath)    chronic   Past Surgical History:  Procedure Laterality Date  . BREAST LUMPECTOMY  01/12/2011   right  . TUBAL LIGATION     Social History   Socioeconomic History  . Marital status: Married    Spouse name: None  . Number of children: 1  . Years of education: None  . Highest education level: None  Social Needs  . Financial resource strain: None  . Food insecurity - worry: None  . Food insecurity - inability: None  . Transportation needs - medical: None  . Transportation needs - non-medical: None  Occupational History  . Occupation: Teacher, early years/pre: [employer]  Tobacco Use  . Smoking status: Never Smoker  . Smokeless tobacco: Never Used  Substance and Sexual Activity  . Alcohol use: No  . Drug use: No  . Sexual activity: Yes  Other Topics Concern  . None  Social History Narrative   Daily caffeine    Outpatient Encounter Medications as of 09/02/2017  Medication Sig  . acetaminophen (TYLENOL) 325 MG tablet Take 2 tablets (650 mg total) by mouth every 6 (six) hours as needed for mild pain (or Fever >/= 101).  Marland Kitchen albuterol (PROVENTIL) (2.5 MG/3ML) 0.083% nebulizer solution Take 3 mLs (2.5 mg total) by nebulization every 2 (two) hours as needed for wheezing or shortness of breath.  . budesonide (PULMICORT) 0.5 MG/2ML nebulizer solution Take 0.5 mg by nebulization 2 (two) times daily as needed (for shortness of breath).   . ferrous sulfate 325  (65 FE) MG EC tablet Take 325 mg by mouth daily with breakfast.  . levothyroxine (SYNTHROID, LEVOTHROID) 75 MCG tablet Take 1 tablet (75 mcg total) by mouth daily before breakfast.  . midodrine (PROAMATINE) 5 MG tablet Take 1 tablet (5 mg total) by mouth 2 (two) times daily with a meal.  . ondansetron (ZOFRAN ODT) 4 MG disintegrating tablet Take 1 tablet (4 mg total) by mouth every 8 (eight) hours as needed for nausea or vomiting.  Marland Kitchen oxyCODONE-acetaminophen (ROXICET) 5-325 MG tablet 1 TID prn pain  . pantoprazole (PROTONIX) 40 MG tablet TAKE 1 TABLET BY MOUTH ONCE DAILY  . potassium chloride SA (K-DUR,KLOR-CON) 20 MEQ tablet Take 1 tablet (20 mEq total) by mouth daily.  . predniSONE (DELTASONE) 10 MG tablet Take 1 tablet (10 mg total) by mouth daily with breakfast.  . promethazine (PHENERGAN) 25 MG tablet Take 1 tablet (25 mg total) by mouth every 6 (six) hours as needed for nausea or vomiting.  . [DISCONTINUED] levothyroxine (SYNTHROID, LEVOTHROID) 75 MCG tablet Take 75 mcg by mouth daily before breakfast.   . [DISCONTINUED] predniSONE (DELTASONE) 10 MG tablet Take 1 tablet (10 mg total) by mouth daily with breakfast.   No facility-administered encounter medications on file as of 09/02/2017.    ALLERGIES: Allergies  Allergen Reactions  .  Bee Venom Anaphylaxis  . Other Other (See Comments)    Contrast Dye CT chest w/ contrast at OSH followed by SOB resolved w/ single dose steroids, never ENT swelling, intubation or pressors, has had multiple contrasted scans since  . Codeine Itching    Other reaction(s): ITCHING  . Morphine And Related Other (See Comments)    Unknown reaction- pt gets sick, dizzy, and confused Intolerance, not an allergy, received this in the emergency department recently without a problem.    VACCINATION STATUS: Immunization History  Administered Date(s) Administered  . Influenza Whole 06/27/2010, 07/28/2011  . Pneumococcal Conjugate-13 06/15/2014  . Pneumococcal  Polysaccharide-23 06/10/2013    HPI   57 yr old female patient with medical history as above. She is being seen in follow up for history of adrenal insufficiency  And hypothyroidism.  - Her history is complicated including sarcoidosis which she dealt with for the last 10 years. Her therapy included high-dose steroids, up to 60 mg daily of prednisone, for the last 4-6 years. - Last year,   she was hospitalized for what appears to be septic shock. She required mechanical ventilation for respiratory failure and was transferred to [facility] for pulmonary care.  - After that episode she was diagnosed with adrenal insufficiency. At the beginning, she was not consistent taking her pants on. She is currently more consistent taking her prednisone 10 mmol by mouth every morning. She continued to feel better, no interval hospitalizations, no new complaints today.   - She was also recently diagnosed with hypothyroidism and was initiated on levothyroxine , currently at 75 g by mouth every morning.  - She is also more consistent taking this medication lately. - She reports weight gain, approximately 4 pounds since her last visit. - She admits to dietary indiscretion including consumption of large quantities of sweet tea. - She denies interrupting her steroids replacement. - The details of her diagnosis with adrenal insufficiency  is not available to review. -She does not wear medic alert. She denies any history of tuberculosis, incarceration, nor overseas travel . -She denies family history of adrenal, pituitary dysfunction. She has family history of hypothyroidism in one sibling. - Denies any history of intra-abdominal injury. She is on hydrocodone for chronic pain and inhaled bronchodilators. She is known to have orthostatic hypotension for which she is on midodrine- currently 5 minute grams by mouth twice a day.   Review of Systems  Constitutional:  + She has gained 4 pounds since last visit, + fatigue, no  subjective hyperthermia, no subjective hypothermia Eyes: no blurry vision, no xerophthalmia ENT: No sore throat, no nodules in the neck, no dysphagia odynophagia.  Cardiovascular: no Chest Pain, no Shortness of Breath, no palpitations, no leg swelling Respiratory:  + Intermittent dry  cough, no SOB Gastrointestinal: no Nausea/Vomiting/Diarhhea Musculoskeletal: no muscle/joint aches Skin: no rashes Neurological: + no tremors, no numbness, no tingling. No dizziness,  no headaches. Psychiatric: no depression, no anxiety  Objective:    BP 127/80   Pulse 88   Ht 5\' 10"  (1.778 m)   Wt 226 lb (102.5 kg)   BMI 32.43 kg/m   Wt Readings from Last 3 Encounters:  09/02/17 226 lb (102.5 kg)  08/22/17 226 lb 12.8 oz (102.9 kg)  08/08/17 220 lb (99.8 kg)    Physical Exam  Constitutional:  + Obese, not in acute distress, normal state of mind Eyes: PERRLA, EOMI, no exophthalmos ENT: moist mucous membranes, no thyromegaly, no cervical lymphadenopathy Cardiovascular: normal precordial activity, Regular  Rate and Rhythm, no Murmur/Rubs/Gallops Respiratory:  adequate breathing efforts, no gross chest deformity, Clear to auscultation bilaterally Gastrointestinal: abdomen soft, Non -tender, No distension, Bowel Sounds present Musculoskeletal: no gross deformities, strength intact in all four extremities Skin: moist, warm, no rashes Neurological: no tremor with outstretched hands, Deep tendon reflexes normal in all four extremities.  CMP     Component Value Date/Time   NA 142 08/09/2017 0957   K 3.9 08/09/2017 0957   CL 101 08/09/2017 0957   CO2 24 08/09/2017 0957   GLUCOSE 89 08/09/2017 0957   GLUCOSE 98 08/08/2017 2122   BUN 14 08/09/2017 0957   CREATININE 0.97 08/09/2017 0957   CREATININE 0.85 08/26/2014 0831   CALCIUM 9.9 08/09/2017 0957   PROT 7.4 06/20/2017 1538   PROT 6.7 04/19/2017 0842   ALBUMIN 4.3 08/09/2017 0957   AST 18 06/20/2017 1538   ALT 17 06/20/2017 1538   ALKPHOS 72  06/20/2017 1538   BILITOT 0.6 06/20/2017 1538   BILITOT 0.3 04/19/2017 0842   GFRNONAA 66 08/09/2017 0957   GFRAA 77 08/09/2017 0957     Diabetic Labs (most recent): Lab Results  Component Value Date   HGBA1C 5.0 12/31/2016   HGBA1C 5.3 08/21/2016    Lipid Panel     Component Value Date/Time   CHOL 221 (H) 07/27/2016 1046   TRIG 193 (H) 07/27/2016 1046   HDL 49 07/27/2016 1046   CHOLHDL 4.5 (H) 07/27/2016 1046   CHOLHDL 4.0 08/26/2014 0831   VLDL 31 08/26/2014 0831   LDLCALC 133 (H) 07/27/2016 1046    Results for VENDELA, TROUNG (MRN [id number]) as of 09/02/2017 08:26  Ref. Range 04/19/2017 08:42 08/09/2017 09:57  TSH Latest Ref Range: 0.450 - 4.500 uIU/mL 5.190 (H) 0.545  T4,Free(Direct) Latest Ref Range: 0.82 - 1.77 ng/dL 1.15 1.38  Thyroperoxidase Ab SerPl-aCnc Latest Ref Range: 0 - 34 IU/mL  15  Thyroglobulin Antibody Latest Ref Range: 0.0 - 0.9 IU/mL  <1.0    Assessment & Plan:   1. Adrenal insufficiency ([place]) -I have reviewed her available records and clinically evaluated this patient. She does have multiple risk factors for adrenal insufficiency including exposure to high-dose steroids related to her sarcoidosis. -  She has taken prednisone up to 60 mg daily for up to 6 years prior to her diagnosis with adrenal insufficiency.  - She is stable on current dose of prednisone 10 mg by mouth every morning.   - At this time, it would be unsafe to withdraw steroids to do tests to specify  etiology of adrenal insufficiency such as Addison's disease. -Given her recent hospitalization for septic shock, I decided to keep replacing her until she becomes stable. I advised her to wear medical alert depicting her adrenal insufficiency and need for daily steroids likely for life. - She will be evaluated for the need for mineralocorticoid replacement which may help to stabilize her blood pressure. She is currently on medical drinking 5 mg by mouth twice a day. - I had a long  discussion with her regarding steroids sick day rules, risk of weight gain/diabetes and the need for periodic screening with A1c. -Her last A1c was 5% indicating absence of prediabetes/diabetes.    2. Hypothyroidism:  - She is more consistent taking her levothyroxine lately. -   Her thyroid function tests are consistent with appropriate replacement. I advised her to continue levothyroxine 75 g by mouth every morning. - - We discussed about correct intake of levothyroxine, at fasting, with water,  separated by at least 30 minutes from breakfast, and separated by more than 4 hours from calcium, iron, multivitamins, acid reflux medications (PPIs). -Patient is made aware of the fact that thyroid hormone replacement is needed for life, dose to be adjusted by periodic monitoring of thyroid function tests.  - I advised patient to maintain close follow up with [name], MD for primary care needs. Follow up plan: Return in about 6 months (around 03/02/2018) for follow up with pre-visit labs.  Glade Lloyd, MD Phone: [phone]  Fax: [phone]  This note was partially dictated with voice recognition software. Similar sounding words can be transcribed inadequately or may not  be corrected upon review.  09/02/2017, 8:39 AM

## 2017-09-03 LAB — LIPID PANEL
Chol/HDL Ratio: 4.4 ratio (ref 0.0–4.4)
Cholesterol, Total: 253 mg/dL — ABNORMAL HIGH (ref 100–199)
HDL: 57 mg/dL (ref >39)
LDL Calculated: 161 mg/dL — ABNORMAL HIGH (ref 0–99)
Triglycerides: 176 mg/dL — ABNORMAL HIGH (ref 0–149)
VLDL Cholesterol Cal: 35 mg/dL (ref 5–40)

## 2017-09-03 MED FILL — MIDODRINE HCL 5 MG TABLET: 5 | 30 days supply | Qty: 60 | Fill #2

## 2017-09-05 DIAGNOSIS — R6889 Other general symptoms and signs: Secondary | ICD-10-CM | POA: Diagnosis not present

## 2017-09-05 DIAGNOSIS — J849 Interstitial pulmonary disease, unspecified: Secondary | ICD-10-CM | POA: Diagnosis not present

## 2017-09-05 DIAGNOSIS — R0600 Dyspnea, unspecified: Secondary | ICD-10-CM | POA: Diagnosis not present

## 2017-09-05 DIAGNOSIS — R0602 Shortness of breath: Secondary | ICD-10-CM | POA: Diagnosis not present

## 2017-09-10 DIAGNOSIS — J849 Interstitial pulmonary disease, unspecified: Secondary | ICD-10-CM | POA: Diagnosis not present

## 2017-09-10 DIAGNOSIS — D860 Sarcoidosis of lung: Secondary | ICD-10-CM | POA: Diagnosis not present

## 2017-09-10 DIAGNOSIS — R0602 Shortness of breath: Secondary | ICD-10-CM | POA: Diagnosis not present

## 2017-09-19 ENCOUNTER — Encounter: Payer: Self-pay | Admitting: Family Medicine

## 2017-09-19 ENCOUNTER — Ambulatory Visit (INDEPENDENT_AMBULATORY_CARE_PROVIDER_SITE_OTHER): Payer: 59 | Admitting: Family Medicine

## 2017-09-19 VITALS — BP 132/82 | Ht 70.0 in | Wt 220.6 lb

## 2017-09-19 DIAGNOSIS — G894 Chronic pain syndrome: Secondary | ICD-10-CM | POA: Diagnosis not present

## 2017-09-19 DIAGNOSIS — M87052 Idiopathic aseptic necrosis of left femur: Secondary | ICD-10-CM

## 2017-09-19 MED ORDER — AMOXICILLIN-POT CLAVULANATE 875-125 MG PO TABS: 1.0000 | ORAL_TABLET | Freq: Two times a day (BID) | ORAL | 0 refills | Status: DC

## 2017-09-19 MED ORDER — OXYCODONE-ACETAMINOPHEN 5-325 MG PO TABS: ORAL_TABLET | ORAL | 0 refills | Status: DC

## 2017-09-19 MED ORDER — OXYCODONE-ACETAMINOPHEN 7.5-325 MG PO TABS: 1.0000 | ORAL_TABLET | Freq: Three times a day (TID) | ORAL | 0 refills | Status: DC | PRN

## 2017-09-19 MED ORDER — AMOXICILLIN-POT CLAVULANATE 875-125 MG PO TABS: 1.0000 | ORAL_TABLET | Freq: Two times a day (BID) | ORAL | 0 refills | Status: AC

## 2017-09-19 NOTE — Progress Notes (Signed)
   Subjective:    Patient ID: Kathleen [name], female    DOB: 12/22/1960, 57 y.o.   MRN: [id number]  HPI Patient arrives for a follow up on hip pain.  Patient was severe pain left hip has avascular necrosis her lung specialist sent her to orthopedist at [facility] they did not feel surgery indicated yet pain medicine helping a little not much denies abuse of it denies addicted to it   Patient states she has been around a sick child and is starting with symptoms and wants to knock it out early.  Relates head congestion sore throat denies high fever chills sweats   Review of Systems  Constitutional: Negative for activity change and appetite change.  HENT: Negative for congestion.   Respiratory: Negative for cough.   Cardiovascular: Negative for chest pain.  Gastrointestinal: Negative for abdominal pain and vomiting.  Skin: Negative for color change.  Neurological: Negative for weakness.  Psychiatric/Behavioral: Negative for confusion.       Objective:   Physical Exam  Constitutional: She appears well-nourished. No distress.  HENT:  Head: Normocephalic.  Right Ear: External ear normal.  Left Ear: External ear normal.  Eyes: Right eye exhibits no discharge. Left eye exhibits no discharge.  Neck: No tracheal deviation present.  Cardiovascular: Normal rate, regular rhythm and normal heart sounds.  No murmur heard. Pulmonary/Chest: Effort normal and breath sounds normal. No respiratory distress. She has no wheezes. She has no rales.  Musculoskeletal: She exhibits no edema.  Lymphadenopathy:    She has no cervical adenopathy.  Neurological: She is alert.  Psychiatric: Her behavior is normal.  Vitals reviewed.         Assessment & Plan:  Viral syndrome because patient is at high risk of pneumonia printed prescription of antibiotics given she can get this filled if worsening symptoms  Left hip necrosis follow through with orthopedics hopefully at some point they will do  surgery  Chronic hip pain increase a dose of the medication if this does not is good if not we will need to bump up the dose the patient is aware that there is a letter to what pain medication can do for her she agrees not to abuse it she will take 1 in the morning 1 midday want me in the evening drug registry was checked  If this increased dose is helping she will notify us and we will give 2 additional prescriptions follow-up in 3 months

## 2017-09-20 MED FILL — AMOX TR-K CLV 875-125 MG TA: 875-125 | 10 days supply | Qty: 20 | Fill #0

## 2017-09-20 MED FILL — OXYCODONE/APAP 7.5/325MG: 7.5-325 | 30 days supply | Qty: 90 | Fill #0

## 2017-09-30 ENCOUNTER — Emergency Department (HOSPITAL_COMMUNITY)
Admission: EM | Admit: 2017-09-30 | Discharge: 2017-09-30 | Disposition: A | Discharge status: Home / Self Care | Payer: 59 | Attending: Emergency Medicine | Admitting: Emergency Medicine

## 2017-09-30 ENCOUNTER — Other Ambulatory Visit: Payer: Self-pay

## 2017-09-30 ENCOUNTER — Encounter (HOSPITAL_COMMUNITY): Payer: Self-pay | Admitting: Emergency Medicine

## 2017-09-30 ENCOUNTER — Emergency Department (HOSPITAL_COMMUNITY): Payer: 59

## 2017-09-30 DIAGNOSIS — F439 Reaction to severe stress, unspecified: Secondary | ICD-10-CM | POA: Diagnosis not present

## 2017-09-30 DIAGNOSIS — R079 Chest pain, unspecified: Secondary | ICD-10-CM | POA: Insufficient documentation

## 2017-09-30 DIAGNOSIS — F419 Anxiety disorder, unspecified: Secondary | ICD-10-CM | POA: Diagnosis not present

## 2017-09-30 DIAGNOSIS — Z79899 Other long term (current) drug therapy: Secondary | ICD-10-CM | POA: Diagnosis not present

## 2017-09-30 DIAGNOSIS — R0602 Shortness of breath: Secondary | ICD-10-CM | POA: Diagnosis not present

## 2017-09-30 DIAGNOSIS — Z885 Allergy status to narcotic agent status: Secondary | ICD-10-CM | POA: Insufficient documentation

## 2017-09-30 DIAGNOSIS — E039 Hypothyroidism, unspecified: Secondary | ICD-10-CM | POA: Diagnosis not present

## 2017-09-30 DIAGNOSIS — Z91030 Bee allergy status: Secondary | ICD-10-CM | POA: Insufficient documentation

## 2017-09-30 LAB — BASIC METABOLIC PANEL
Anion gap: 10 (ref 5–15)
BUN: 12 mg/dL (ref 6–20)
CO2: 23 mmol/L (ref 22–32)
Calcium: 9 mg/dL (ref 8.9–10.3)
Chloride: 106 mmol/L (ref 101–111)
Creatinine, Ser: 0.96 mg/dL (ref 0.44–1.00)
GFR calc Af Amer: >60 mL/min (ref >60)
GFR calc non Af Amer: >60 mL/min (ref >60)
Glucose, Bld: 121 mg/dL — ABNORMAL HIGH (ref 65–99)
Potassium: 3.4 mmol/L — ABNORMAL LOW (ref 3.5–5.1)
Sodium: 139 mmol/L (ref 135–145)

## 2017-09-30 LAB — CBC
HCT: 37 % (ref 36.0–46.0)
Hemoglobin: 11.8 g/dL — ABNORMAL LOW (ref 12.0–15.0)
MCH: 28 pg (ref 26.0–34.0)
MCHC: 31.9 g/dL (ref 30.0–36.0)
MCV: 87.7 fL (ref 78.0–100.0)
Platelets: 353 10*3/uL (ref 150–400)
RBC: 4.22 MIL/uL (ref 3.87–5.11)
RDW: 14.8 % (ref 11.5–15.5)
WBC: 6.6 10*3/uL (ref 4.0–10.5)

## 2017-09-30 LAB — I-STAT BETA HCG BLOOD, ED (MC, WL, AP ONLY): I-stat hCG, quantitative: <5 m[IU]/mL (ref <5)

## 2017-09-30 LAB — I-STAT TROPONIN, ED: Troponin i, poc: 0 ng/mL (ref 0.00–0.08)

## 2017-09-30 MED ORDER — ACETAMINOPHEN 500 MG PO TABS
1000.0000 mg | ORAL_TABLET | Freq: Once | ORAL | Status: AC
  Administered 2017-09-30: 1000 mg via ORAL
  Filled 2017-09-30: qty 2

## 2017-09-30 NOTE — ED Triage Notes (Signed)
Pt c/o CP with SOB since last night. Mid chest into back.

## 2017-09-30 NOTE — Discharge Instructions (Signed)
The testing today, is reassuring.  There is no sign of cardiac or pulmonary abnormalities at this time.    Make sure that you are working on reducing stress, in your life, and consider seeing a therapist.  Also, follow-up with your primary care doctor as needed for problems.

## 2017-09-30 NOTE — ED Provider Notes (Signed)
[facility] EMERGENCY DEPARTMENT Provider Note   CSN: [id number] Arrival date & time: 09/30/17  2020     History   Chief Complaint Chief Complaint  Patient presents with  . Chest Pain    HPI Kathleen [name] is a 57 y.o. female.  Patient is here for evaluation of her.  When she could not talk, and intermittent chest pain.  She feels like the talking problem was related to her prior diagnosis of vocal cord dysfunction.  The discomfort lasted just a few seconds and has not recurred.  She reports that it occurred when she was coming home from the hospital visiting her husband's daughter.  Patient has chronic baseline stress is under more stress recently because of the daughter's illness.  She also had a brief episode of chest discomfort yesterday.  The chest pain which she is having last just a few seconds and comes on spontaneously and randomly.  She denies fever, chills, cough, persistent shortness of breath, weakness or dizziness.  There are no other known modifying factors.  HPI  Past Medical History:  Diagnosis Date  . Adrenal insufficiency ([place])   . Anemia   . Avascular bone necrosis ([place])   . Breast fibrocystic disorder   . Chronic pain   . Fibromyalgia   . Gastroesophageal reflux disease   . Mitral valve prolapse   . Orthostatic hypotension   . Peripheral neuropathy   . Restrictive lung disease    Moderate to severe  . Sarcoidosis    Biopsy proven - [facility]  . SOB (shortness of breath)    chronic    Patient Active Problem List   Diagnosis Date Noted  . Personal history of noncompliance with medical treatment, presenting hazards to health 04/26/2017  . Hypothyroidism 01/15/2017  . Septic shock ([place])   . Elevated troponin   . Symptomatic bradycardia   . Acute respiratory failure with hypoxia ([place])   . Adrenal insufficiency ([place]) 08/17/2016  . Hypokalemia 08/17/2016  . Chronic pain syndrome 04/20/2016  . Vaginal bleeding 01/02/2016  . Acute blood loss anemia  12/31/2015  . Gastroenteritis   . Hypovolemic shock ([place]) 12/30/2015  . Nausea and vomiting 12/29/2015  . Hypotension 12/29/2015  . Dehydration 12/28/2015  . Constipation 11/14/2015  . Avascular necrosis of bone of left hip ([place]) 04/05/2015  . Bimalleolar fracture of right ankle 02/11/2015  . Post herpetic neuralgia 10/22/2014  . Hyperlipidemia 08/25/2014  . Obstructive sleep apnea 02/18/2014  . Obesity, unspecified 05/06/2013  . Fibromyalgia 02/11/2013  . Depression 02/11/2013  . Abdominal pain, other specified site 02/11/2013  . Shingles 02/02/2013  . Urinary retention 02/02/2013  . Pedal edema 01/22/2013  . Orthostatic hypotension 01/16/2013  . Disturbance of skin sensation 11/26/2012  . Pain in limb 11/26/2012  . Helicobacter pylori gastritis 05/23/2012  . Cough 05/02/2012  . Fibrocystic breast disease in female 04/09/2012  . DOE (dyspnea on exertion) 08/03/2011  . Anxiety state 10/19/2010  . CHEST PAIN 11/22/2008  . Sarcoidosis 06/21/2008    Past Surgical History:  Procedure Laterality Date  . BREAST LUMPECTOMY  01/12/2011   right  . TUBAL LIGATION      OB History    Gravida Para Term Preterm AB Living   1 1 1          SAB TAB Ectopic Multiple Live Births                   Home Medications    Prior to Admission medications  Medication Sig Start Date End Date Taking? Authorizing Provider  acetaminophen (TYLENOL) 325 MG tablet Take 2 tablets (650 mg total) by mouth every 6 (six) hours as needed for mild pain (or Fever >/= 101). 08/21/16  Yes Corey Harold, NP  albuterol (PROVENTIL) (2.5 MG/3ML) 0.083% nebulizer solution Take 3 mLs (2.5 mg total) by nebulization every 2 (two) hours as needed for wheezing or shortness of breath. 02/07/17  Yes Luking, Scott A, MD  budesonide (PULMICORT) 0.5 MG/2ML nebulizer solution Take 0.5 mg by nebulization 2 (two) times daily as needed (for shortness of breath).  06/03/17  Yes [provider]  ferrous sulfate 325 (65  FE) MG EC tablet Take 325 mg by mouth daily with breakfast.   Yes [provider]  levothyroxine (SYNTHROID, LEVOTHROID) 75 MCG tablet Take 1 tablet (75 mcg total) by mouth daily before breakfast. 09/02/17  Yes Nida, Marella Chimes, MD  midodrine (PROAMATINE) 5 MG tablet Take 1 tablet (5 mg total) by mouth 2 (two) times daily with a meal. 05/21/17  Yes Luking, Elayne Snare, MD  Multiple Vitamins-Minerals (EMERGEN-C IMMUNE PO) Take 1-2 each by mouth daily. GUMMIES   Yes [provider]  ondansetron (ZOFRAN ODT) 4 MG disintegrating tablet Take 1 tablet (4 mg total) by mouth every 8 (eight) hours as needed for nausea or vomiting. 05/06/17  Yes Francine Graven, DO  oxyCODONE-acetaminophen (PERCOCET) 7.5-325 MG tablet Take 1 tablet by mouth 3 (three) times daily as needed for severe pain. 09/19/17  Yes [name], MD  pantoprazole (PROTONIX) 40 MG tablet TAKE 1 TABLET BY MOUTH ONCE DAILY 02/20/17  Yes [name], MD  predniSONE (DELTASONE) 10 MG tablet Take 1 tablet (10 mg total) by mouth daily with breakfast. 09/02/17  Yes Nida, Marella Chimes, MD  promethazine (PHENERGAN) 25 MG tablet Take 1 tablet (25 mg total) by mouth every 6 (six) hours as needed for nausea or vomiting. 05/26/17  Yes Sherwood Gambler, MD    Family History Family History  Problem Relation Age of Onset  . Cardiomyopathy Mother   . Breast cancer Mother   . Prostate cancer Father   . Neurofibromatosis Brother   . Heart disease Maternal Aunt   . Bipolar disorder Daughter   . Colon cancer Neg Hx     Social History Social History   Tobacco Use  . Smoking status: Never Smoker  . Smokeless tobacco: Never Used  Substance Use Topics  . Alcohol use: No  . Drug use: No     Allergies   Bee venom; Other; Codeine; and Morphine and related   Review of Systems Review of Systems  All other systems reviewed and are negative.    Physical Exam Updated Vital Signs BP (!) 104/51 (BP Location: Left Arm)    Pulse 74   Temp 97.7 F (36.5 C) (Oral)   Resp (!) 22   Ht 5\' 10"  (1.778 m)   Wt 99.8 kg (220 lb)   SpO2 97%   BMI 31.57 kg/m   Physical Exam  Constitutional: She is oriented to person, place, and time. She appears well-developed.  Overweight  HENT:  Head: Normocephalic and atraumatic.  Eyes: Conjunctivae and EOM are normal. Pupils are equal, round, and reactive to light.  Neck: Normal range of motion and phonation normal. Neck supple.  Cardiovascular: Normal rate and regular rhythm.  Pulmonary/Chest: Effort normal and breath sounds normal. She has no decreased breath sounds. She has no wheezes. She has no rales. She exhibits no tenderness.  Abdominal: Soft. She exhibits no distension. There is no tenderness. There is no guarding.  Musculoskeletal: Normal range of motion.  Neurological: She is alert and oriented to person, place, and time. She exhibits normal muscle tone.  Skin: Skin is warm and dry.  Psychiatric: Her behavior is normal. Judgment and thought content normal.  Mildly anxious  Nursing note and vitals reviewed.    ED Treatments / Results  Labs (all labs ordered are listed, but only abnormal results are displayed) Labs Reviewed  CBC - Abnormal; Notable for the following components:      Result Value   Hemoglobin 11.8 (*)    All other components within normal limits  BASIC METABOLIC PANEL  I-STAT BETA HCG BLOOD, ED ([facility], [facility], [facility] ONLY)  I-STAT TROPONIN, ED    EKG  EKG Interpretation  Date/Time:  Monday September 30 2017 20:41:07 EST Ventricular Rate:  80 PR Interval:  134 QRS Duration: 72 QT Interval:  354 QTC Calculation: 408 R Axis:   40 Text Interpretation:  Normal sinus rhythm Nonspecific T wave abnormality Abnormal ECG since last tracing no significant change Reconfirmed by Daleen Bo [phone]) on 09/30/2017 10:01:46 PM       Radiology Dg Chest 2 View  Result Date: 09/30/2017 CLINICAL DATA:  Shortness of breath and chest pain. History of  sarcoidosis EXAM: CHEST  2 VIEW COMPARISON:  August 08, 2017 and October 20, 2015 FINDINGS: There is chronic opacification in the posterior aspect of the left upper lobe better seen on lateral view. Milder scarring is noted in the right upper lobe. There is no edema or consolidation. Heart size and pulmonary vascularity are normal. No adenopathy. No bone lesions. IMPRESSION: Chronic scarring, most notable in the posterior segment of the left upper lobe, likely residua from known chronic sarcoidosis. No new opacity. Heart size normal. No adenopathy appreciable. Electronically Signed   By: Lowella Grip III M.D.   On: 09/30/2017 20:58    Procedures Procedures (including critical care time)  Medications Ordered in ED Medications - No data to display   Initial Impression / Assessment and Plan / ED Course  I have reviewed the triage vital signs and the nursing notes.  Pertinent labs & imaging results that were available during my care of the patient were reviewed by me and considered in my medical decision making (see chart for details).      Patient Vitals for the past 24 hrs:  BP Temp Temp src Pulse Resp SpO2 Height Weight  09/30/17 2157 (!) 104/51 - - 74 (!) 22 97 % - -  09/30/17 2036 - - - - - - 5\' 10"  (1.778 m) 99.8 kg (220 lb)  09/30/17 2035 135/72 97.7 F (36.5 C) Oral 77 18 97 % - -    10:31 PM Reevaluation with update and discussion. After initial assessment and treatment, an updated evaluation reveals no change in clinical status, she remains comfortable.  Findings discussed and questions answered.  Family members are with the patient. Daleen Bo      Final Clinical Impressions(s) / ED Diagnoses   Final diagnoses:  None   Nonspecific difficulty talking, and chest pain.  Both of these are likely stress related.  Both of the symptoms are transient and self resolved.  Doubt ACS, PE or pneumonia.  Screening EKG, chest x-ray, cardiac troponin and CBC/BMETare  normal.  Nursing Notes Reviewed/ Care Coordinated Applicable Imaging Reviewed Interpretation of Laboratory Data incorporated into ED treatment  The patient appears reasonably screened and/or stabilized  for discharge and I doubt any other medical condition or other [facility] requiring further screening, evaluation, or treatment in the ED at this time prior to discharge.  Plan: Home Medications-continue usual medications; Home Treatments-rest, fluids; return here if the recommended treatment, does not improve the symptoms; Recommended follow up-PCP as needed.  Consider seeing counselor.   ED Discharge Orders    None       Daleen Bo, MD 09/30/17 2232

## 2017-09-30 NOTE — ED Notes (Signed)
Pt transported from xray to room #9

## 2017-10-01 MED FILL — predniSONE 10 MG TABS: 10 | 30 days supply | Qty: 30 | Fill #9

## 2017-10-02 ENCOUNTER — Telehealth: Payer: Self-pay | Admitting: *Deleted

## 2017-10-02 NOTE — Telephone Encounter (Signed)
Patient may have 2 additional prescriptions for her oxycodone 7.5 mg / 325 mg, #90, one taken 3 times daily as needed, the drug registry was checked, please state these for October 20, 2017 and November 19, 2017 thank you the patient will need to do a follow-up visit toward the end of April

## 2017-10-02 NOTE — Telephone Encounter (Signed)
Pt seen 1/24 for pain management. She states she was only given one script at the visit and told if the new dose change was working well to call back for the other two scripts. Pt states she is doing well with new dose.

## 2017-10-03 MED ORDER — OXYCODONE-ACETAMINOPHEN 7.5-325 MG PO TABS: 1.0000 | ORAL_TABLET | Freq: Three times a day (TID) | ORAL | 0 refills | Status: DC | PRN

## 2017-10-03 NOTE — Telephone Encounter (Signed)
Patient called back, I let her know her prescriptions were ready.  She said she will pick up 10/04/17.

## 2017-10-03 NOTE — Telephone Encounter (Signed)
Printed awaiting signature. 

## 2017-10-03 NOTE — Telephone Encounter (Signed)
I called left a message to r/c. I have put the rx up front to be picked up.

## 2017-10-10 ENCOUNTER — Telehealth: Payer: Self-pay | Admitting: Family Medicine

## 2017-10-10 NOTE — Telephone Encounter (Signed)
Discussed with pt. Pt verbalized understanding.  °

## 2017-10-10 NOTE — Telephone Encounter (Signed)
Pt called to let Dr. Nicki Reaper know that she has been exposed to the flu. Pt states that a family member has tested positive. Pt states that she is only having body aches at the moment. Pt will call back to make an appt if more symptoms occur.

## 2017-10-10 NOTE — Telephone Encounter (Signed)
If the patient starts having body aches fever chills we need to see her or at the very least she needs to get started on Tamiflu

## 2017-10-15 ENCOUNTER — Inpatient Hospital Stay (HOSPITAL_COMMUNITY)
Admission: EM | Admit: 2017-10-15 | Discharge: 2017-10-18 | DRG: 644 | Disposition: A | Discharge status: Home Health | Payer: 59 | Attending: Internal Medicine | Admitting: Internal Medicine

## 2017-10-15 ENCOUNTER — Ambulatory Visit (INDEPENDENT_AMBULATORY_CARE_PROVIDER_SITE_OTHER): Payer: 59 | Admitting: Family Medicine

## 2017-10-15 ENCOUNTER — Emergency Department (HOSPITAL_COMMUNITY): Payer: 59

## 2017-10-15 ENCOUNTER — Encounter (HOSPITAL_COMMUNITY): Payer: Self-pay | Admitting: Emergency Medicine

## 2017-10-15 ENCOUNTER — Other Ambulatory Visit: Payer: Self-pay

## 2017-10-15 ENCOUNTER — Encounter: Payer: Self-pay | Admitting: Family Medicine

## 2017-10-15 VITALS — BP 128/76 | Temp 99.5°F | Ht 70.0 in | Wt 221.4 lb

## 2017-10-15 DIAGNOSIS — Z885 Allergy status to narcotic agent status: Secondary | ICD-10-CM | POA: Diagnosis not present

## 2017-10-15 DIAGNOSIS — Z7952 Long term (current) use of systemic steroids: Secondary | ICD-10-CM | POA: Diagnosis not present

## 2017-10-15 DIAGNOSIS — E785 Hyperlipidemia, unspecified: Secondary | ICD-10-CM | POA: Diagnosis not present

## 2017-10-15 DIAGNOSIS — G4733 Obstructive sleep apnea (adult) (pediatric): Secondary | ICD-10-CM | POA: Diagnosis present

## 2017-10-15 DIAGNOSIS — J111 Influenza due to unidentified influenza virus with other respiratory manifestations: Secondary | ICD-10-CM | POA: Diagnosis not present

## 2017-10-15 DIAGNOSIS — K219 Gastro-esophageal reflux disease without esophagitis: Secondary | ICD-10-CM | POA: Diagnosis not present

## 2017-10-15 DIAGNOSIS — E039 Hypothyroidism, unspecified: Secondary | ICD-10-CM | POA: Diagnosis present

## 2017-10-15 DIAGNOSIS — D869 Sarcoidosis, unspecified: Secondary | ICD-10-CM | POA: Diagnosis present

## 2017-10-15 DIAGNOSIS — Z91030 Bee allergy status: Secondary | ICD-10-CM | POA: Diagnosis not present

## 2017-10-15 DIAGNOSIS — B349 Viral infection, unspecified: Secondary | ICD-10-CM | POA: Diagnosis not present

## 2017-10-15 DIAGNOSIS — G894 Chronic pain syndrome: Secondary | ICD-10-CM | POA: Diagnosis present

## 2017-10-15 DIAGNOSIS — J101 Influenza due to other identified influenza virus with other respiratory manifestations: Secondary | ICD-10-CM

## 2017-10-15 DIAGNOSIS — E2740 Unspecified adrenocortical insufficiency: Secondary | ICD-10-CM

## 2017-10-15 DIAGNOSIS — Z7951 Long term (current) use of inhaled steroids: Secondary | ICD-10-CM

## 2017-10-15 DIAGNOSIS — M797 Fibromyalgia: Secondary | ICD-10-CM | POA: Diagnosis present

## 2017-10-15 DIAGNOSIS — M879 Osteonecrosis, unspecified: Secondary | ICD-10-CM | POA: Diagnosis present

## 2017-10-15 DIAGNOSIS — G629 Polyneuropathy, unspecified: Secondary | ICD-10-CM | POA: Diagnosis present

## 2017-10-15 DIAGNOSIS — Z8249 Family history of ischemic heart disease and other diseases of the circulatory system: Secondary | ICD-10-CM

## 2017-10-15 DIAGNOSIS — D649 Anemia, unspecified: Secondary | ICD-10-CM | POA: Diagnosis present

## 2017-10-15 DIAGNOSIS — Z8042 Family history of malignant neoplasm of prostate: Secondary | ICD-10-CM

## 2017-10-15 DIAGNOSIS — Z803 Family history of malignant neoplasm of breast: Secondary | ICD-10-CM | POA: Diagnosis not present

## 2017-10-15 DIAGNOSIS — E272 Addisonian crisis: Secondary | ICD-10-CM | POA: Diagnosis not present

## 2017-10-15 DIAGNOSIS — I341 Nonrheumatic mitral (valve) prolapse: Secondary | ICD-10-CM | POA: Diagnosis present

## 2017-10-15 DIAGNOSIS — Z79890 Hormone replacement therapy (postmenopausal): Secondary | ICD-10-CM | POA: Diagnosis not present

## 2017-10-15 DIAGNOSIS — R112 Nausea with vomiting, unspecified: Secondary | ICD-10-CM | POA: Diagnosis present

## 2017-10-15 DIAGNOSIS — I700 Atherosclerosis of aorta: Secondary | ICD-10-CM | POA: Diagnosis present

## 2017-10-15 DIAGNOSIS — I959 Hypotension, unspecified: Secondary | ICD-10-CM | POA: Diagnosis present

## 2017-10-15 DIAGNOSIS — E860 Dehydration: Secondary | ICD-10-CM | POA: Diagnosis present

## 2017-10-15 DIAGNOSIS — R05 Cough: Secondary | ICD-10-CM | POA: Diagnosis not present

## 2017-10-15 DIAGNOSIS — E782 Mixed hyperlipidemia: Secondary | ICD-10-CM | POA: Diagnosis present

## 2017-10-15 LAB — BASIC METABOLIC PANEL
Anion gap: 11 (ref 5–15)
BUN: 12 mg/dL (ref 6–20)
CO2: 22 mmol/L (ref 22–32)
Calcium: 9.1 mg/dL (ref 8.9–10.3)
Chloride: 103 mmol/L (ref 101–111)
Creatinine, Ser: 1.03 mg/dL — ABNORMAL HIGH (ref 0.44–1.00)
GFR calc Af Amer: >60 mL/min (ref >60)
GFR calc non Af Amer: >60 mL/min (ref >60)
Glucose, Bld: 97 mg/dL (ref 65–99)
Potassium: 3.8 mmol/L (ref 3.5–5.1)
Sodium: 136 mmol/L (ref 135–145)

## 2017-10-15 LAB — MAGNESIUM: Magnesium: 2 mg/dL (ref 1.7–2.4)

## 2017-10-15 LAB — CBC WITH DIFFERENTIAL/PLATELET
Basophils Absolute: 0 10*3/uL (ref 0.0–0.1)
Basophils Relative: 0 %
Eosinophils Absolute: 0.1 10*3/uL (ref 0.0–0.7)
Eosinophils Relative: 1 %
HCT: 38.4 % (ref 36.0–46.0)
Hemoglobin: 12.2 g/dL (ref 12.0–15.0)
Lymphocytes Relative: 6 %
Lymphs Abs: 0.4 10*3/uL — ABNORMAL LOW (ref 0.7–4.0)
MCH: 28 pg (ref 26.0–34.0)
MCHC: 31.8 g/dL (ref 30.0–36.0)
MCV: 88.1 fL (ref 78.0–100.0)
Monocytes Absolute: 0.9 10*3/uL (ref 0.1–1.0)
Monocytes Relative: 12 %
Neutro Abs: 5.5 10*3/uL (ref 1.7–7.7)
Neutrophils Relative %: 81 %
Platelets: 348 10*3/uL (ref 150–400)
RBC: 4.36 MIL/uL (ref 3.87–5.11)
RDW: 14.8 % (ref 11.5–15.5)
WBC: 6.9 10*3/uL (ref 4.0–10.5)

## 2017-10-15 LAB — TSH: TSH: 0.769 u[IU]/mL (ref 0.350–4.500)

## 2017-10-15 LAB — TROPONIN I: Troponin I: <0.03 ng/mL (ref <0.03)

## 2017-10-15 LAB — RAPID STREP SCREEN (MED CTR MEBANE ONLY): Streptococcus, Group A Screen (Direct): NEGATIVE

## 2017-10-15 LAB — PHOSPHORUS: Phosphorus: 3.4 mg/dL (ref 2.5–4.6)

## 2017-10-15 MED ORDER — IPRATROPIUM-ALBUTEROL 0.5-2.5 (3) MG/3ML IN SOLN
3.0000 mL | Freq: Once | RESPIRATORY_TRACT | Status: AC
  Administered 2017-10-15: 3 mL via RESPIRATORY_TRACT
  Filled 2017-10-15: qty 3

## 2017-10-15 MED ORDER — SODIUM CHLORIDE 0.9 % IV BOLUS (SEPSIS)
1000.0000 mL | Freq: Once | INTRAVENOUS | Status: AC
  Administered 2017-10-15: 1000 mL via INTRAVENOUS

## 2017-10-15 MED ORDER — ONDANSETRON 8 MG PO TBDP: 8.0000 mg | ORAL_TABLET | Freq: Three times a day (TID) | ORAL | 1 refills | Status: DC | PRN

## 2017-10-15 MED ORDER — ACETAMINOPHEN 500 MG PO TABS
500.0000 mg | ORAL_TABLET | Freq: Once | ORAL | Status: AC
  Administered 2017-10-15: 500 mg via ORAL
  Filled 2017-10-15: qty 1

## 2017-10-15 MED ORDER — HYDROCORTISONE NA SUCCINATE PF 100 MG IJ SOLR
100.0000 mg | Freq: Once | INTRAMUSCULAR | Status: AC
  Administered 2017-10-15: 100 mg via INTRAVENOUS
  Filled 2017-10-15: qty 2

## 2017-10-15 MED ORDER — MIDODRINE HCL 5 MG PO TABS
10.0000 mg | ORAL_TABLET | Freq: Once | ORAL | Status: AC
  Administered 2017-10-16: 10 mg via ORAL
  Filled 2017-10-15: qty 2

## 2017-10-15 MED ORDER — OSELTAMIVIR PHOSPHATE 75 MG PO CAPS: 75.0000 mg | ORAL_CAPSULE | Freq: Two times a day (BID) | ORAL | 0 refills | Status: DC

## 2017-10-15 MED ORDER — ONDANSETRON HCL 4 MG/2ML IJ SOLN
4.0000 mg | Freq: Once | INTRAMUSCULAR | Status: AC
  Administered 2017-10-15: 4 mg via INTRAVENOUS
  Filled 2017-10-15: qty 2

## 2017-10-15 MED ORDER — METHYLPREDNISOLONE SODIUM SUCC 125 MG IJ SOLR
125.0000 mg | Freq: Once | INTRAMUSCULAR | Status: AC
  Administered 2017-10-15: 125 mg via INTRAVENOUS
  Filled 2017-10-15: qty 2

## 2017-10-15 NOTE — ED Notes (Signed)
Have elevated pt's bed and declined the [facility] to try to compensate for BP. MD at bedside

## 2017-10-15 NOTE — Patient Instructions (Signed)

## 2017-10-15 NOTE — H&P (Signed)
History and Physical    Kathleen [name] YCX:[id number] DOB: Apr 07, 1961 DOA: 10/15/2017  PCP: [name], MD   Patient coming from: PCP's office..  I have personally briefly reviewed patient's old medical records in [place]  Chief Complaint: Hypotension nausea and vomiting.  HPI: Kathleen [name] is a 57 y.o. female with medical history significant of renal insufficiency, anemia, avascular bone necrosis, breast fibrocystic disorder, chronic pain, fibromyalgia, GERD, mitral valve prolapse, orthostatic hypotension, peripheral neuropathy, restrictive lung disease, sarcoidosis, chronic dyspnea  who is coming to the emergency department, referred by Dr. Wolfgang Phoenix her PCP, due to nausea, vomiting with hypotension and his office.  She has had a fever, chills, fatigue and malaise.  Positive productive cough, wheezing and dyspnea, but no hemoptysis.  She had been seen earlier this week for flulike symptoms and tested positive for influenza.  She has been taking Tamiflu since yesterday.  Otherwise, no chest pain, palpitations, diaphoresis or recent pitting edema of the lower extremities.  She has been lightheaded at times.  No abdominal pain, diarrhea, melena or hematochezia.  She denies dysuria, frequency or hematuria.   And she mentions that she has been taking her daily prednisone.  However, Dr. Wolfgang Phoenix mentioned that she may be off the prednisone and her daughter stated that she has been vomiting, so there is a possibility that the patient may have thrown up her prednisone tablets, although she thinks that this did not happen.   ED Course: Initial vital signs temperature 100.72F, pulse 104, respirations 21, blood pressure 117/70 mmHg and O2 sat 98%.  Blood pressure rechecks have been ranging from 87-101 on the SBP and 46-55 on DBP.   Her lab work shows a CBC with a white count was 6.9, 81% neutrophils, 12% monocytes and 6% lymphocytes, hemoglobin 12.2 g/dL and platelets 348.  Her BMP  shows a creatinine of 1.03 mg/dL, all other values were normal.  Troponin was normal.  Rapid strep group A was negative.  Magnesium was 2.0 and phosphorus 3.4 mg/dL.  Her TSH was 0.769 uU/L.  Imaging: her chest radiograph shows areas of scaring, most notably on the posterior of left upper lobe.  No edema or consolidation with normal heart size and evidence of aortic atherosclerosis.  Medications in the ED.  She received 2000 mL bolus of normal saline in the emergency department, a DuoNeb, Zofran 4 mg IVP and Solu-Medrol 125 mg.  I added Solu-Cortef 100 mg IVP x1 and Midodrin 10 mg p.o. x1.  Review of Systems: As per HPI otherwise 10 point review of systems negative.    Past Medical History:  Diagnosis Date  . Adrenal insufficiency ([place])   . Anemia   . Avascular bone necrosis ([place])   . Breast fibrocystic disorder   . Chronic pain   . Fibromyalgia   . Gastroesophageal reflux disease   . Mitral valve prolapse   . Orthostatic hypotension   . Peripheral neuropathy   . Restrictive lung disease    Moderate to severe  . Sarcoidosis    Biopsy proven - [facility]  . SOB (shortness of breath)    chronic    Past Surgical History:  Procedure Laterality Date  . BREAST LUMPECTOMY  01/12/2011   right  . TUBAL LIGATION       reports that  has never smoked. she has never used smokeless tobacco. She reports that she does not drink alcohol or use drugs.  Allergies  Allergen Reactions  .  Bee Venom Anaphylaxis  . Other Other (See Comments)    Contrast Dye CT chest w/ contrast at OSH followed by SOB resolved w/ single dose steroids, never ENT swelling, intubation or pressors, has had multiple contrasted scans since  . Codeine Itching    Other reaction(s): ITCHING  . Morphine And Related Other (See Comments)    Unknown reaction- pt gets sick, dizzy, and confused Intolerance, not an allergy, received this in the emergency department recently without a problem.    Family History  Problem Relation  Age of Onset  . Cardiomyopathy Mother   . Breast cancer Mother   . Prostate cancer Father   . Neurofibromatosis Brother   . Heart disease Maternal Aunt   . Bipolar disorder Daughter   . Colon cancer Neg Hx     Prior to Admission medications   Medication Sig Start Date End Date Taking? Authorizing Provider  acetaminophen (TYLENOL) 325 MG tablet Take 2 tablets (650 mg total) by mouth every 6 (six) hours as needed for mild pain (or Fever >/= 101). 08/21/16  Yes Corey Harold, NP  albuterol (PROVENTIL) (2.5 MG/3ML) 0.083% nebulizer solution Take 3 mLs (2.5 mg total) by nebulization every 2 (two) hours as needed for wheezing or shortness of breath. 02/07/17  Yes Luking, Scott A, MD  budesonide (PULMICORT) 0.5 MG/2ML nebulizer solution Take 0.5 mg by nebulization 2 (two) times daily as needed (for shortness of breath).  06/03/17  Yes [provider]  ferrous sulfate 325 (65 FE) MG EC tablet Take 325 mg by mouth daily with breakfast.   Yes [provider]  levothyroxine (SYNTHROID, LEVOTHROID) 75 MCG tablet Take 1 tablet (75 mcg total) by mouth daily before breakfast. 09/02/17  Yes Nida, Marella Chimes, MD  midodrine (PROAMATINE) 5 MG tablet Take 1 tablet (5 mg total) by mouth 2 (two) times daily with a meal. 05/21/17  Yes Luking, Elayne Snare, MD  Multiple Vitamins-Minerals (EMERGEN-C IMMUNE PO) Take 1-2 each by mouth daily. GUMMIES   Yes [provider]  ondansetron (ZOFRAN ODT) 8 MG disintegrating tablet Take 1 tablet (8 mg total) by mouth every 8 (eight) hours as needed for nausea or vomiting. 10/15/17  Yes [name], MD  oseltamivir (TAMIFLU) 75 MG capsule Take 1 capsule (75 mg total) by mouth 2 (two) times daily. 10/15/17  Yes [name], MD  oxyCODONE-acetaminophen (PERCOCET) 7.5-325 MG tablet Take 1 tablet by mouth 3 (three) times daily as needed for severe pain. 10/03/17  Yes [name], MD  pantoprazole (PROTONIX) 40 MG tablet TAKE 1 TABLET BY MOUTH ONCE DAILY  02/20/17  Yes [name], MD  predniSONE (DELTASONE) 10 MG tablet Take 1 tablet (10 mg total) by mouth daily with breakfast. 09/02/17  Yes Nida, Marella Chimes, MD  promethazine (PHENERGAN) 25 MG tablet Take 1 tablet (25 mg total) by mouth every 6 (six) hours as needed for nausea or vomiting. 05/26/17  Yes Sherwood Gambler, MD    Physical Exam: Vitals:   10/15/17 2130 10/15/17 2218 10/15/17 2300 10/15/17 2303  BP: (!) 87/55 (!) 87/53 (!) 101/46   Pulse: 82 81 84   Resp: (!) 28 (!) 24 (!) 26   Temp:      TempSrc:      SpO2: 96% 95% 94% 96%    Constitutional: NAD, calm, comfortable Eyes: PERRL, lids and conjunctivae normal ENMT: Mucous membranes are moist. Posterior pharynx clear of any exudate or lesions. Neck: normal, supple, no masses, no thyromegaly Respiratory:  clear to auscultation bilaterally, no wheezing, no crackles. Normal respiratory effort. No accessory muscle use.  Cardiovascular: Regular rate and rhythm, no murmurs / rubs / gallops. No extremity edema. 2+ pedal pulses. No carotid bruits.  Abdomen: Soft, no tenderness, no masses palpated. No hepatosplenomegaly. Bowel sounds positive.  Musculoskeletal: no clubbing / cyanosis. Good ROM, no contractures. Normal muscle tone.  Skin: no rashes, lesions, ulcers on limited skin exam. Neurologic: CN 2-12 grossly intact. Sensation intact, DTR normal. Strength 5/5 in all 4.  Psychiatric: Normal judgment and insight. Alert and oriented x 4. Normal mood.    Labs on Admission: I have personally reviewed following labs and imaging studies  CBC: Recent Labs  Lab 10/15/17 1738  WBC 6.9  NEUTROABS 5.5  HGB 12.2  HCT 38.4  MCV 88.1  PLT 182   Basic Metabolic Panel: Recent Labs  Lab 10/15/17 1738  NA 136  K 3.8  CL 103  CO2 22  GLUCOSE 97  BUN 12  CREATININE 1.03*  CALCIUM 9.1   GFR: Estimated Creatinine Clearance: 79.2 mL/min (A) (by C-G formula based on SCr of 1.03 mg/dL (H)). Liver Function Tests: No results for  input(s): AST, ALT, ALKPHOS, BILITOT, PROT, ALBUMIN in the last 168 hours. No results for input(s): LIPASE, AMYLASE in the last 168 hours. No results for input(s): AMMONIA in the last 168 hours. Coagulation Profile: No results for input(s): INR, PROTIME in the last 168 hours. Cardiac Enzymes: Recent Labs  Lab 10/15/17 1738  TROPONINI <0.03   BNP (last 3 results) No results for input(s): PROBNP in the last 8760 hours. HbA1C: No results for input(s): HGBA1C in the last 72 hours. CBG: No results for input(s): GLUCAP in the last 168 hours. Lipid Profile: No results for input(s): CHOL, HDL, LDLCALC, TRIG, CHOLHDL, LDLDIRECT in the last 72 hours. Thyroid Function Tests: Recent Labs    10/15/17 2203  TSH 0.769   Anemia Panel: No results for input(s): VITAMINB12, FOLATE, FERRITIN, TIBC, IRON, RETICCTPCT in the last 72 hours. Urine analysis:    Component Value Date/Time   COLORURINE YELLOW 05/06/2017 1019   APPEARANCEUR CLEAR 05/06/2017 1019   LABSPEC 1.020 05/06/2017 1019   PHURINE 6.0 05/06/2017 1019   GLUCOSEU NEGATIVE 05/06/2017 1019   GLUCOSEU NEGATIVE 05/29/2010 1622   HGBUR NEGATIVE 05/06/2017 1019   BILIRUBINUR SMALL (A) 05/06/2017 1019   BILIRUBINUR Positive 08/16/2015 0928   KETONESUR NEGATIVE 05/06/2017 1019   PROTEINUR NEGATIVE 05/06/2017 1019   UROBILINOGEN 0.2 06/16/2014 0942   NITRITE NEGATIVE 05/06/2017 1019   LEUKOCYTESUR NEGATIVE 05/06/2017 1019    Radiological Exams on Admission: Dg Chest 2 View  Result Date: 10/15/2017 CLINICAL DATA:  Cough and congestion EXAM: CHEST  2 VIEW COMPARISON:  September 30, 2017 and October 20, 2015 FINDINGS: There is scarring in the left upper lobe posteriorly. A lesser degree of scarring is noted in the left base and right upper lobe. There is no edema or consolidation. The heart size and pulmonary vascularity are normal. No adenopathy. There is aortic atherosclerosis. No evident bone lesions. IMPRESSION: Areas of scarring, most  notably in the posterior segment of the left upper lobe. No edema or consolidation. Heart size normal. There is aortic atherosclerosis. Aortic Atherosclerosis (ICD10-I70.0). Electronically Signed   By: Lowella Grip III M.D.   On: 10/15/2017 17:35   Echocardiogram complete 10/15/2016  ------------------------------------------------------------------- LV EF: 55% -   60%  ------------------------------------------------------------------- History:   PMH:  Hypotension, Acute Respiratory Failure, Elevated Troponin. PHTN.  Risk factors:  Dyslipidemia.  -------------------------------------------------------------------  Study Conclusions  - Left ventricle: The cavity size was normal. Wall thickness was   increased in a pattern of mild LVH. Systolic function was normal.   The estimated ejection fraction was in the range of 55% to 60%.   Wall motion was normal; there were no regional wall motion   abnormalities. Diastolic dysfunction, grade indeterminate. Normal   filling pressures. - Right ventricle: The cavity size was mildly dilated. Systolic   function was abnormal (appears mild to moderately reduced) in   spite of normal hemodynamic parameters. Right ventricular apex   appears trabeculated.   EKG: Independently reviewed. Vent. rate 114 BPM PR interval 120 ms QRS duration 70 ms QT/QTc 306/421 ms P-R-T axes 56 36 -12 Sinus tachycardia ST & T wave abnormality, consider inferior ischemia Abnormal ECG  Assessment/Plan Principal Problem:   Acute adrenal crisis ([place]) Admit to stepdown/inpatient. Continue IV fluids. Monitor intake and output. Solu-Cortef 100 mg x1 dose given. She also received Solu-Medrol 125 mg IVP earlier. Continue Solu-Cortef 50 mg IV every 8 hours.  Active Problems:   Hypotension Continue normal saline at 125 mL/hr. Continue Midodrin.  Will temporarily increase to 10 mg p.o. twice daily. The patient's daughters stated that she has history of volume  overload in the past. Monitor intake and output closely.    Nausea and vomiting Continue IV fluids. Antiemetics as needed.    Influenza Continue Tamiflu 75 mg p.o. twice daily.    Sarcoidosis Currently getting hydrocortisone. Resume prednisone once the patient is more stable. Continue albuterol and budesonide nebulizer treatments.    Hyperlipidemia Currently not on medical therapy.    Gastroesophageal reflux disease Pantoprazole 40 mg p.o. daily.    DVT prophylaxis: Lovenox SQ. Code Status: Full code. Family Communication: 2 daughters were present in the emergency department room. Disposition Plan: Admit to stepdown for IV hydration, IV hydrocortisone and close blood pressure monitoring. Consults called:  Admission status: Inpatient/stepdown.   Reubin Milan MD [facility] Pager [phone]  If 7PM-7AM, please contact night-coverage [email or web address] Password [email or web address]  10/15/2017, 11:14 PM

## 2017-10-15 NOTE — ED Notes (Signed)
MD at bedside. 

## 2017-10-15 NOTE — ED Triage Notes (Signed)
Pt sent by Dr Wolfgang Phoenix.  Dr Wolfgang Phoenix reports pt was hypotensive at his office, pt has had n/v, hx of adrenal insufficiency.

## 2017-10-15 NOTE — Progress Notes (Signed)
   Subjective:    Patient ID: Kathleen [name], female    DOB: 04/10/1961, 57 y.o.   MRN: [id number]  Sinusitis  The current episode started yesterday. Associated symptoms include chills, congestion, coughing, headaches, shortness of breath and a sore throat. (Vomiting, nausea, left side pain (arm down), SOB) Treatments tried: nausea med.   This patient has a history of sarcoidosis She has a history of pneumonia with respiratory failure Also a history of adrenal insufficiency She is off prednisone She did not get flu shot She has a 24-36-hour history of sore throat runny nose body aches headache fever and chills She has not been drinking well today states she is thrown up some She relates she feels very weak.  She was able to drive herself here   Review of Systems  Constitutional: Positive for chills.  HENT: Positive for congestion and sore throat.   Respiratory: Positive for cough and shortness of breath.   Neurological: Positive for headaches.       Objective:   Physical Exam Eardrums are normal mucous membranes are moist neck is supple lungs clear no crackles heart slight tachycardia blood pressure 92/68 extremities no edema skin warm dry       Assessment & Plan:  Influenza Tamiflu was sent to [facility] as well as Zofran Patient states she feels very similar to how she did before when she needed fluids she requests further evaluation ER ER was spoken with at triage They will see the patient and evaluate her further possible IV fluids possible lab work  Should be noted that the patient does have a history of aggressive pneumonia and respiratory failure.  She also has a history of adrenal insufficiency with low blood pressure for which she takes midodrine and she follows with endocrinology regarding her renal insufficiency  Patient states she was able to drive to the office and feels safe to drive to the ER at [facility]

## 2017-10-15 NOTE — ED Provider Notes (Addendum)
[facility] EMERGENCY DEPARTMENT Provider Note   CSN: [id number] Arrival date & time: 10/15/17  1657     History   Chief Complaint Chief Complaint  Patient presents with  . Hypotension    HPI Kathleen [name] is a 57 y.o. female.  Patient reports prodromal viral syndrome for the past day, typically achiness, sore throat, cough, nausea.  She was seen by her primary care doctor today and started on Tamiflu and Zofran.  She also reports low blood pressure today which is often present with her adrenal insufficiency.  It is not unusual for her blood pressure to be low.  She has had to be admitted to the hospital in the past for similar symptoms.  Severity of illness is moderate.      Past Medical History:  Diagnosis Date  . Adrenal insufficiency ([place])   . Anemia   . Avascular bone necrosis ([place])   . Breast fibrocystic disorder   . Chronic pain   . Fibromyalgia   . Gastroesophageal reflux disease   . Mitral valve prolapse   . Orthostatic hypotension   . Peripheral neuropathy   . Restrictive lung disease    Moderate to severe  . Sarcoidosis    Biopsy proven - [facility]  . SOB (shortness of breath)    chronic    Patient Active Problem List   Diagnosis Date Noted  . Personal history of noncompliance with medical treatment, presenting hazards to health 04/26/2017  . Hypothyroidism 01/15/2017  . Septic shock ([place])   . Elevated troponin   . Symptomatic bradycardia   . Acute respiratory failure with hypoxia ([place])   . Adrenal insufficiency ([place]) 08/17/2016  . Hypokalemia 08/17/2016  . Chronic pain syndrome 04/20/2016  . Vaginal bleeding 01/02/2016  . Acute blood loss anemia 12/31/2015  . Gastroenteritis   . Hypovolemic shock ([place]) 12/30/2015  . Nausea and vomiting 12/29/2015  . Hypotension 12/29/2015  . Dehydration 12/28/2015  . Constipation 11/14/2015  . Avascular necrosis of bone of left hip ([place]) 04/05/2015  . Bimalleolar fracture of right ankle 02/11/2015  . Post  herpetic neuralgia 10/22/2014  . Hyperlipidemia 08/25/2014  . Obstructive sleep apnea 02/18/2014  . Obesity, unspecified 05/06/2013  . Fibromyalgia 02/11/2013  . Depression 02/11/2013  . Abdominal pain, other specified site 02/11/2013  . Shingles 02/02/2013  . Urinary retention 02/02/2013  . Pedal edema 01/22/2013  . Orthostatic hypotension 01/16/2013  . Disturbance of skin sensation 11/26/2012  . Pain in limb 11/26/2012  . Helicobacter pylori gastritis 05/23/2012  . Cough 05/02/2012  . Fibrocystic breast disease in female 04/09/2012  . DOE (dyspnea on exertion) 08/03/2011  . Anxiety state 10/19/2010  . CHEST PAIN 11/22/2008  . Sarcoidosis 06/21/2008    Past Surgical History:  Procedure Laterality Date  . BREAST LUMPECTOMY  01/12/2011   right  . TUBAL LIGATION      OB History    Gravida Para Term Preterm AB Living   1 1 1          SAB TAB Ectopic Multiple Live Births                   Home Medications    Prior to Admission medications   Medication Sig Start Date End Date Taking? Authorizing Provider  acetaminophen (TYLENOL) 325 MG tablet Take 2 tablets (650 mg total) by mouth every 6 (six) hours as needed for mild pain (or Fever >/= 101). 08/21/16   Corey Harold, NP  albuterol (PROVENTIL) (2.5 MG/3ML) 0.083%  nebulizer solution Take 3 mLs (2.5 mg total) by nebulization every 2 (two) hours as needed for wheezing or shortness of breath. 02/07/17   [name], MD  budesonide (PULMICORT) 0.5 MG/2ML nebulizer solution Take 0.5 mg by nebulization 2 (two) times daily as needed (for shortness of breath).  06/03/17   [provider]  ferrous sulfate 325 (65 FE) MG EC tablet Take 325 mg by mouth daily with breakfast.    [provider]  levothyroxine (SYNTHROID, LEVOTHROID) 75 MCG tablet Take 1 tablet (75 mcg total) by mouth daily before breakfast. 09/02/17   Nida, Marella Chimes, MD  midodrine (PROAMATINE) 5 MG tablet Take 1 tablet (5 mg total) by mouth 2  (two) times daily with a meal. 05/21/17   Luking, Elayne Snare, MD  Multiple Vitamins-Minerals (EMERGEN-C IMMUNE PO) Take 1-2 each by mouth daily. [place]    [provider]  ondansetron (ZOFRAN ODT) 8 MG disintegrating tablet Take 1 tablet (8 mg total) by mouth every 8 (eight) hours as needed for nausea or vomiting. 10/15/17   [name], MD  oseltamivir (TAMIFLU) 75 MG capsule Take 1 capsule (75 mg total) by mouth 2 (two) times daily. 10/15/17   [name], MD  oxyCODONE-acetaminophen (PERCOCET) 7.5-325 MG tablet Take 1 tablet by mouth 3 (three) times daily as needed for severe pain. 10/03/17   [name], MD  pantoprazole (PROTONIX) 40 MG tablet TAKE 1 TABLET BY MOUTH ONCE DAILY 02/20/17   [name], MD  predniSONE (DELTASONE) 10 MG tablet Take 1 tablet (10 mg total) by mouth daily with breakfast. 09/02/17   Nida, Marella Chimes, MD  promethazine (PHENERGAN) 25 MG tablet Take 1 tablet (25 mg total) by mouth every 6 (six) hours as needed for nausea or vomiting. 05/26/17   Sherwood Gambler, MD    Family History Family History  Problem Relation Age of Onset  . Cardiomyopathy Mother   . Breast cancer Mother   . Prostate cancer Father   . Neurofibromatosis Brother   . Heart disease Maternal Aunt   . Bipolar disorder Daughter   . Colon cancer Neg Hx     Social History Social History   Tobacco Use  . Smoking status: Never Smoker  . Smokeless tobacco: Never Used  Substance Use Topics  . Alcohol use: No  . Drug use: No     Allergies   Bee venom; Other; Codeine; and Morphine and related   Review of Systems Review of Systems  All other systems reviewed and are negative.    Physical Exam Updated Vital Signs BP (!) 87/55   Pulse 82   Temp (!) 100.9 F (38.3 C) (Oral)   Resp (!) 28   SpO2 96%   Physical Exam  Constitutional: She is oriented to person, place, and time.  Alert, nad  HENT:  Head: Normocephalic and atraumatic.  Eyes: Conjunctivae are normal.   Neck: Neck supple.  Cardiovascular: Normal rate and regular rhythm.  Pulmonary/Chest: Effort normal and breath sounds normal.  Abdominal: Soft. Bowel sounds are normal.  Musculoskeletal: Normal range of motion.  Neurological: She is alert and oriented to person, place, and time.  Skin: Skin is warm and dry.  Psychiatric: She has a normal mood and affect. Her behavior is normal.  Nursing note and vitals reviewed.    ED Treatments / Results  Labs (all labs ordered are listed, but only abnormal results are displayed) Labs Reviewed  CBC WITH DIFFERENTIAL/PLATELET - Abnormal; Notable for the  following components:      Result Value   Lymphs Abs 0.4 (*)    All other components within normal limits  BASIC METABOLIC PANEL - Abnormal; Notable for the following components:   Creatinine, Ser 1.03 (*)    All other components within normal limits  RAPID STREP SCREEN (NOT AT [facility])  TROPONIN I    EKG  EKG Interpretation  Date/Time:  Tuesday October 15 2017 17:09:36 EST Ventricular Rate:  114 PR Interval:  120 QRS Duration: 70 QT Interval:  306 QTC Calculation: 421 R Axis:   36 Text Interpretation:  Sinus tachycardia ST & T wave abnormality, consider inferior ischemia Abnormal ECG Confirmed by Nat Christen [phone]) on 10/15/2017 9:36:37 PM Also confirmed by Nat Christen [phone])  on 10/15/2017 9:36:56 PM       Radiology Dg Chest 2 View  Result Date: 10/15/2017 CLINICAL DATA:  Cough and congestion EXAM: CHEST  2 VIEW COMPARISON:  September 30, 2017 and October 20, 2015 FINDINGS: There is scarring in the left upper lobe posteriorly. A lesser degree of scarring is noted in the left base and right upper lobe. There is no edema or consolidation. The heart size and pulmonary vascularity are normal. No adenopathy. There is aortic atherosclerosis. No evident bone lesions. IMPRESSION: Areas of scarring, most notably in the posterior segment of the left upper lobe. No edema or consolidation. Heart size  normal. There is aortic atherosclerosis. Aortic Atherosclerosis (ICD10-I70.0). Electronically Signed   By: Lowella Grip III M.D.   On: 10/15/2017 17:35    Procedures Procedures (including critical care time)  Medications Ordered in ED Medications  ipratropium-albuterol (DUONEB) 0.5-2.5 (3) MG/3ML nebulizer solution 3 mL (not administered)  acetaminophen (TYLENOL) tablet 500 mg (500 mg Oral Given 10/15/17 1712)  sodium chloride 0.9 % bolus 1,000 mL (1,000 mLs Intravenous New Bag/Given 10/15/17 2101)  methylPREDNISolone sodium succinate (SOLU-MEDROL) 125 mg/2 mL injection 125 mg (125 mg Intravenous Given 10/15/17 2059)  sodium chloride 0.9 % bolus 1,000 mL (1,000 mLs Intravenous New Bag/Given 10/15/17 2101)  ondansetron (ZOFRAN) injection 4 mg (4 mg Intravenous Given 10/15/17 2059)     Initial Impression / Assessment and Plan / ED Course  I have reviewed the triage vital signs and the nursing notes.  Pertinent labs & imaging results that were available during my care of the patient were reviewed by me and considered in my medical decision making (see chart for details).     History and physical most consistent with viral syndrome.  Patient has adrenal insufficiency.  She was given IV fluids and IV steroids.  She is very alert, but blood pressure has remained soft. Will admit for hydration and observation.  CRITICAL CARE Performed by: Nat Christen Total critical care time: 30 minutes Critical care time was exclusive of separately billable procedures and treating other patients. Critical care was necessary to treat or prevent imminent or life-threatening deterioration. Critical care was time spent personally by me on the following activities: development of treatment plan with patient and/or surrogate as well as nursing, discussions with consultants, evaluation of patient's response to treatment, examination of patient, obtaining history from patient or surrogate, ordering and performing  treatments and interventions, ordering and review of laboratory studies, ordering and review of radiographic studies, pulse oximetry and re-evaluation of patient's condition.  Final Clinical Impressions(s) / ED Diagnoses   Final diagnoses:  Viral syndrome  Adrenal insufficiency (HCC)  Hypotension, unspecified hypotension type    ED Discharge Orders    None  Nat Christen, MD 10/15/17 0488    Nat Christen, MD 10/15/17 [phone]

## 2017-10-15 NOTE — ED Notes (Signed)
Pt walking around to bathroom without assistance

## 2017-10-15 NOTE — ED Notes (Signed)
Have started pt on a NS infusion at 250 an hour per orders from Dr. Lacinda Axon

## 2017-10-15 NOTE — ED Notes (Signed)
Pt states she would like a catheter. NT scanned over 858mL in bladder. Will notify oncoming nurse.

## 2017-10-16 ENCOUNTER — Other Ambulatory Visit: Payer: Self-pay

## 2017-10-16 DIAGNOSIS — D869 Sarcoidosis, unspecified: Secondary | ICD-10-CM

## 2017-10-16 DIAGNOSIS — E272 Addisonian crisis: Principal | ICD-10-CM

## 2017-10-16 DIAGNOSIS — K219 Gastro-esophageal reflux disease without esophagitis: Secondary | ICD-10-CM

## 2017-10-16 DIAGNOSIS — J101 Influenza due to other identified influenza virus with other respiratory manifestations: Secondary | ICD-10-CM

## 2017-10-16 DIAGNOSIS — J111 Influenza due to unidentified influenza virus with other respiratory manifestations: Secondary | ICD-10-CM

## 2017-10-16 DIAGNOSIS — I959 Hypotension, unspecified: Secondary | ICD-10-CM

## 2017-10-16 DIAGNOSIS — R112 Nausea with vomiting, unspecified: Secondary | ICD-10-CM

## 2017-10-16 LAB — COMPREHENSIVE METABOLIC PANEL
ALT: 15 U/L (ref 14–54)
AST: 17 U/L (ref 15–41)
Albumin: 3.3 g/dL — ABNORMAL LOW (ref 3.5–5.0)
Alkaline Phosphatase: 56 U/L (ref 38–126)
Anion gap: 8 (ref 5–15)
BUN: 11 mg/dL (ref 6–20)
CO2: 22 mmol/L (ref 22–32)
Calcium: 8.6 mg/dL — ABNORMAL LOW (ref 8.9–10.3)
Chloride: 109 mmol/L (ref 101–111)
Creatinine, Ser: 0.91 mg/dL (ref 0.44–1.00)
GFR calc Af Amer: >60 mL/min (ref >60)
GFR calc non Af Amer: >60 mL/min (ref >60)
Glucose, Bld: 148 mg/dL — ABNORMAL HIGH (ref 65–99)
Potassium: 4.2 mmol/L (ref 3.5–5.1)
Sodium: 139 mmol/L (ref 135–145)
Total Bilirubin: 0.3 mg/dL (ref 0.3–1.2)
Total Protein: 6.3 g/dL — ABNORMAL LOW (ref 6.5–8.1)

## 2017-10-16 LAB — URINALYSIS, ROUTINE W REFLEX MICROSCOPIC
Bilirubin Urine: NEGATIVE
Glucose, UA: NEGATIVE mg/dL
Hgb urine dipstick: NEGATIVE
Ketones, ur: NEGATIVE mg/dL
Leukocytes, UA: NEGATIVE
Nitrite: NEGATIVE
Protein, ur: NEGATIVE mg/dL
Specific Gravity, Urine: 1.004 — ABNORMAL LOW (ref 1.005–1.030)
pH: 5 (ref 5.0–8.0)

## 2017-10-16 LAB — CBC WITH DIFFERENTIAL/PLATELET
Basophils Absolute: 0 10*3/uL (ref 0.0–0.1)
Basophils Relative: 0 %
Eosinophils Absolute: 0 10*3/uL (ref 0.0–0.7)
Eosinophils Relative: 0 %
HCT: 34.3 % — ABNORMAL LOW (ref 36.0–46.0)
Hemoglobin: 10.9 g/dL — ABNORMAL LOW (ref 12.0–15.0)
Lymphocytes Relative: 7 %
Lymphs Abs: 0.4 10*3/uL — ABNORMAL LOW (ref 0.7–4.0)
MCH: 28.2 pg (ref 26.0–34.0)
MCHC: 31.8 g/dL (ref 30.0–36.0)
MCV: 88.9 fL (ref 78.0–100.0)
Monocytes Absolute: 0.1 10*3/uL (ref 0.1–1.0)
Monocytes Relative: 1 %
Neutro Abs: 5 10*3/uL (ref 1.7–7.7)
Neutrophils Relative %: 92 %
Platelets: 294 10*3/uL (ref 150–400)
RBC: 3.86 MIL/uL — ABNORMAL LOW (ref 3.87–5.11)
RDW: 14.7 % (ref 11.5–15.5)
WBC: 5.5 10*3/uL (ref 4.0–10.5)

## 2017-10-16 LAB — RESPIRATORY PANEL BY PCR
Adenovirus: NOT DETECTED
Bordetella pertussis: NOT DETECTED
Chlamydophila pneumoniae: NOT DETECTED
Coronavirus 229E: NOT DETECTED
Coronavirus HKU1: DETECTED — AB
Coronavirus NL63: NOT DETECTED
Coronavirus OC43: DETECTED — AB
Influenza A H3: DETECTED — AB
Influenza B: NOT DETECTED
Metapneumovirus: NOT DETECTED
Mycoplasma pneumoniae: NOT DETECTED
Parainfluenza Virus 1: NOT DETECTED
Parainfluenza Virus 2: NOT DETECTED
Parainfluenza Virus 3: NOT DETECTED
Parainfluenza Virus 4: NOT DETECTED
Respiratory Syncytial Virus: NOT DETECTED
Rhinovirus / Enterovirus: NOT DETECTED

## 2017-10-16 LAB — INFLUENZA PANEL BY PCR (TYPE A & B)
Influenza A By PCR: POSITIVE — AB
Influenza B By PCR: NEGATIVE

## 2017-10-16 MED ORDER — FERROUS SULFATE 325 (65 FE) MG PO TABS
325.0000 mg | ORAL_TABLET | Freq: Every day | ORAL | Status: DC
  Administered 2017-10-16 – 2017-10-18 (×3): 325 mg via ORAL
  Filled 2017-10-16 (×5): qty 1

## 2017-10-16 MED ORDER — ACETAMINOPHEN 325 MG PO TABS
650.0000 mg | ORAL_TABLET | Freq: Four times a day (QID) | ORAL | Status: DC | PRN
  Administered 2017-10-16: 650 mg via ORAL
  Filled 2017-10-16: qty 2

## 2017-10-16 MED ORDER — GUAIFENESIN ER 600 MG PO TB12
1200.0000 mg | ORAL_TABLET | Freq: Two times a day (BID) | ORAL | Status: DC
  Administered 2017-10-16 – 2017-10-18 (×5): 1200 mg via ORAL
  Filled 2017-10-16 (×9): qty 2

## 2017-10-16 MED ORDER — BUDESONIDE 0.5 MG/2ML IN SUSP
0.5000 mg | Freq: Two times a day (BID) | RESPIRATORY_TRACT | Status: DC
  Administered 2017-10-17 – 2017-10-18 (×3): 0.5 mg via RESPIRATORY_TRACT
  Filled 2017-10-16 (×8): qty 2

## 2017-10-16 MED ORDER — ENOXAPARIN SODIUM 40 MG/0.4ML ~~LOC~~ SOLN
40.0000 mg | SUBCUTANEOUS | Status: DC
  Administered 2017-10-16 – 2017-10-17 (×2): 40 mg via SUBCUTANEOUS
  Filled 2017-10-16 (×2): qty 0.4

## 2017-10-16 MED ORDER — MIDODRINE HCL 5 MG PO TABS
10.0000 mg | ORAL_TABLET | Freq: Two times a day (BID) | ORAL | Status: DC
  Administered 2017-10-16 – 2017-10-17 (×3): 10 mg via ORAL
  Filled 2017-10-16 (×6): qty 2

## 2017-10-16 MED ORDER — HYDROCORTISONE NA SUCCINATE PF 100 MG IJ SOLR
50.0000 mg | Freq: Four times a day (QID) | INTRAMUSCULAR | Status: DC
  Administered 2017-10-16 – 2017-10-17 (×3): 50 mg via INTRAVENOUS
  Filled 2017-10-16 (×4): qty 2

## 2017-10-16 MED ORDER — SODIUM CHLORIDE 0.9 % IV SOLN
INTRAVENOUS | Status: DC
Start: 2017-10-16 — End: 2017-10-17
  Administered 2017-10-16 (×4): via INTRAVENOUS

## 2017-10-16 MED ORDER — ONDANSETRON HCL 4 MG PO TABS: 4.0000 mg | ORAL_TABLET | Freq: Four times a day (QID) | ORAL | Status: DC | PRN

## 2017-10-16 MED ORDER — ONDANSETRON HCL 4 MG/2ML IJ SOLN
4.0000 mg | Freq: Four times a day (QID) | INTRAMUSCULAR | Status: DC | PRN
  Administered 2017-10-17: 4 mg via INTRAVENOUS
  Filled 2017-10-16: qty 2

## 2017-10-16 MED ORDER — HYDROCORTISONE NA SUCCINATE PF 100 MG IJ SOLR
50.0000 mg | Freq: Three times a day (TID) | INTRAMUSCULAR | Status: DC
  Administered 2017-10-16: 50 mg via INTRAVENOUS
  Filled 2017-10-16: qty 2

## 2017-10-16 MED ORDER — BUDESONIDE 0.5 MG/2ML IN SUSP
0.5000 mg | Freq: Two times a day (BID) | RESPIRATORY_TRACT | Status: DC | PRN
  Filled 2017-10-16: qty 2

## 2017-10-16 MED ORDER — PANTOPRAZOLE SODIUM 40 MG PO TBEC
40.0000 mg | DELAYED_RELEASE_TABLET | Freq: Every day | ORAL | Status: DC
  Administered 2017-10-16 – 2017-10-18 (×3): 40 mg via ORAL
  Filled 2017-10-16 (×3): qty 1

## 2017-10-16 MED ORDER — OSELTAMIVIR PHOSPHATE 75 MG PO CAPS
75.0000 mg | ORAL_CAPSULE | Freq: Two times a day (BID) | ORAL | Status: DC
  Administered 2017-10-16 – 2017-10-18 (×5): 75 mg via ORAL
  Filled 2017-10-16 (×5): qty 1

## 2017-10-16 MED ORDER — LEVOTHYROXINE SODIUM 75 MCG PO TABS
75.0000 ug | ORAL_TABLET | Freq: Every day | ORAL | Status: DC
Start: 2017-10-16 — End: 2017-10-18
  Administered 2017-10-16 – 2017-10-18 (×3): 75 ug via ORAL
  Filled 2017-10-16 (×2): qty 1
  Filled 2017-10-16: qty 2

## 2017-10-16 MED ORDER — ALBUTEROL SULFATE (2.5 MG/3ML) 0.083% IN NEBU: 2.5000 mg | INHALATION_SOLUTION | RESPIRATORY_TRACT | Status: DC | PRN

## 2017-10-16 MED ORDER — IPRATROPIUM-ALBUTEROL 0.5-2.5 (3) MG/3ML IN SOLN
3.0000 mL | Freq: Four times a day (QID) | RESPIRATORY_TRACT | Status: DC
Start: 2017-10-16 — End: 2017-10-17
  Administered 2017-10-16 – 2017-10-17 (×6): 3 mL via RESPIRATORY_TRACT
  Filled 2017-10-16 (×6): qty 3

## 2017-10-16 NOTE — ED Notes (Signed)
PT ambulated to bathroom at this time with NAD noted.

## 2017-10-16 NOTE — ED Notes (Addendum)
Note on other patient.

## 2017-10-16 NOTE — ED Notes (Signed)
Patient given water at this time.  

## 2017-10-16 NOTE — ED Notes (Signed)
Pt states catheter is hurting and wants it out. Dr Lavell Islam states take it out.

## 2017-10-16 NOTE — Progress Notes (Signed)
PROGRESS NOTE    Kathleen [name]  CBJ:[id number] DOB: December 10, 1960 DOA: 10/15/2017 PCP: [name], MD (Confirm with patient/family/NH records and if not entered, this HAS to be entered at [facility] point of entry. "No PCP" if truly none.)   Brief Narrative:  57 year old female with a history of sarcoidosis, adrenal insufficiency, was sent to the hospital from her primary care physician's office due to nausea, vomiting and hypotension.  She had fever, chills, fatigue and malaise.  She tested positive for influenza earlier in the week and had been taking Tamiflu.  She is chronically on prednisone, but was slightly unable to take it due to persistent vomiting.  She was found to be hypotensive on arrival.  Started on IV fluids, intravenous Solu-Cortef and continued on Tamiflu.   Assessment & Plan:   Principal Problem:   Acute adrenal crisis [facility]) Active Problems:   Sarcoidosis   Hyperlipidemia   Nausea and vomiting   Hypotension   Gastroesophageal reflux disease   Influenza   1. Acute adrenal crisis.  Patient had significant nausea and vomiting related to her viral unable to take prednisone.  She is been started on intravenous Solu-Cortef.  She was significantly hypotensive on admission, but overall blood pressures have improved with steroids and IV fluids.  Continue current treatments for today. 2. Hypotension.  Suspect this is related to dehydration/volume depletion, as well as possibly adrenal crisis.  Patient is on IV fluids, admitted drain as well as intravenous steroids.  Monitor closely for signs of volume overload.  Last ejection fraction system is from 09/2016 showed a normal ejection fraction. 3. Nausea and vomiting.  Suspect is related to viral illness.  Continue antiemetics and advance diet as tolerated. 4. Influenza A.  Currently on Tamiflu. 5. Sarcoidosis.  Chronically on prednisone.  Currently on IV steroids.  Continue on inhaled steroids as well as bronchodilators. 6. GERD.   Continue on PPI   DVT prophylaxis: Lovenox Code Status: Full code Family Communication: Discussed with daughter at the bedside Disposition Plan: Discharge home once improved   Consultants:     Procedures:     Antimicrobials:       Subjective: So she does not feel good.  Still feels short of breath.  Feels generally weak.  Objective: Vitals:   10/16/17 1602 10/16/17 1730 10/16/17 1800 10/16/17 1830  BP: (!) 96/51 94/61 (!) 93/55 (!) 98/52  Pulse: 83 61 (!) 50 61  Resp: 18 20 20  (!) 21  Temp:      TempSrc:      SpO2: 98% 97% 100% 98%   No intake or output data in the 24 hours ending 10/16/17 1913 There were no vitals filed for this visit.  Examination:  General exam: Appears calm and comfortable  Respiratory system: Clear to auscultation. Respiratory effort normal. Cardiovascular system: S1 & S2 heard, RRR. No JVD, murmurs, rubs, gallops or clicks. No pedal edema. Gastrointestinal system: Abdomen is nondistended, soft and nontender. No organomegaly or masses felt. Normal bowel sounds heard. Central nervous system: Alert and oriented. No focal neurological deficits. Extremities: Symmetric 5 x 5 power. Skin: No rashes, lesions or ulcers Psychiatry: Judgement and insight appear normal. Mood & affect appropriate.     Data Reviewed: I have personally reviewed following labs and imaging studies  CBC: Recent Labs  Lab 10/15/17 1738 10/16/17 0513  WBC 6.9 5.5  NEUTROABS 5.5 5.0  HGB 12.2 10.9*  HCT 38.4 34.3*  MCV 88.1 88.9  PLT 348 160   Basic Metabolic  Panel: Recent Labs  Lab 10/15/17 1738 10/16/17 0513  NA 136 139  K 3.8 4.2  CL 103 109  CO2 22 22  GLUCOSE 97 148*  BUN 12 11  CREATININE 1.03* 0.91  CALCIUM 9.1 8.6*  MG 2.0  --   PHOS 3.4  --    GFR: Estimated Creatinine Clearance: 89.7 mL/min (by C-G formula based on SCr of 0.91 mg/dL). Liver Function Tests: Recent Labs  Lab 10/16/17 0513  AST 17  ALT 15  ALKPHOS 56  BILITOT 0.3    PROT 6.3*  ALBUMIN 3.3*   No results for input(s): LIPASE, AMYLASE in the last 168 hours. No results for input(s): AMMONIA in the last 168 hours. Coagulation Profile: No results for input(s): INR, PROTIME in the last 168 hours. Cardiac Enzymes: Recent Labs  Lab 10/15/17 1738  TROPONINI <0.03   BNP (last 3 results) No results for input(s): PROBNP in the last 8760 hours. HbA1C: No results for input(s): HGBA1C in the last 72 hours. CBG: No results for input(s): GLUCAP in the last 168 hours. Lipid Profile: No results for input(s): CHOL, HDL, LDLCALC, TRIG, CHOLHDL, LDLDIRECT in the last 72 hours. Thyroid Function Tests: Recent Labs    10/15/17 2203  TSH 0.769   Anemia Panel: No results for input(s): VITAMINB12, FOLATE, FERRITIN, TIBC, IRON, RETICCTPCT in the last 72 hours. Sepsis Labs: No results for input(s): PROCALCITON, LATICACIDVEN in the last 168 hours.  Recent Results (from the past 240 hour(s))  Rapid strep screen     Status: None   Collection Time: 10/15/17  8:54 PM  Result Value Ref Range Status   Streptococcus, Group A Screen (Direct) NEGATIVE NEGATIVE Final    Comment: (NOTE) A Rapid Antigen test may result negative if the antigen level in the sample is below the detection level of this test. The FDA has not cleared this test as a stand-alone test therefore the rapid antigen negative result has reflexed to a Group A Strep culture. Performed at [facility], [address] [place], [place] [address]          Radiology Studies: Dg Chest 2 View  Result Date: 10/15/2017 CLINICAL DATA:  Cough and congestion EXAM: CHEST  2 VIEW COMPARISON:  September 30, 2017 and October 20, 2015 FINDINGS: There is scarring in the left upper lobe posteriorly. A lesser degree of scarring is noted in the left base and right upper lobe. There is no edema or consolidation. The heart size and pulmonary vascularity are normal. No adenopathy. There is aortic atherosclerosis. No evident  bone lesions. IMPRESSION: Areas of scarring, most notably in the posterior segment of the left upper lobe. No edema or consolidation. Heart size normal. There is aortic atherosclerosis. Aortic Atherosclerosis (ICD10-I70.0). Electronically Signed   By: Lowella Grip III M.D.   On: 10/15/2017 17:35        Scheduled Meds: . budesonide  0.5 mg Nebulization BID  . enoxaparin (LOVENOX) injection  40 mg Subcutaneous Q24H  . ferrous sulfate  325 mg Oral Q breakfast  . guaiFENesin  1,200 mg Oral BID  . hydrocortisone sod succinate (SOLU-CORTEF) inj  50 mg Intravenous Q6H  . ipratropium-albuterol  3 mL Nebulization Q6H  . levothyroxine  75 mcg Oral QAC breakfast  . midodrine  10 mg Oral BID WC  . oseltamivir  75 mg Oral BID  . pantoprazole  40 mg Oral Daily   Continuous Infusions: . sodium chloride 100 mL/hr at 10/16/17 1753     LOS: 1  day    Time spent: 56mins    [name], MD [facility] Pager [phone]  If 7PM-7AM, please contact night-coverage [email or web address] Password [facility] 10/16/2017, 7:13 PM

## 2017-10-17 DIAGNOSIS — G629 Polyneuropathy, unspecified: Secondary | ICD-10-CM | POA: Diagnosis not present

## 2017-10-17 DIAGNOSIS — B349 Viral infection, unspecified: Secondary | ICD-10-CM

## 2017-10-17 DIAGNOSIS — M879 Osteonecrosis, unspecified: Secondary | ICD-10-CM | POA: Diagnosis not present

## 2017-10-17 DIAGNOSIS — I341 Nonrheumatic mitral (valve) prolapse: Secondary | ICD-10-CM | POA: Diagnosis not present

## 2017-10-17 DIAGNOSIS — M797 Fibromyalgia: Secondary | ICD-10-CM | POA: Diagnosis not present

## 2017-10-17 DIAGNOSIS — E272 Addisonian crisis: Secondary | ICD-10-CM | POA: Diagnosis not present

## 2017-10-17 DIAGNOSIS — E785 Hyperlipidemia, unspecified: Secondary | ICD-10-CM

## 2017-10-17 DIAGNOSIS — D869 Sarcoidosis, unspecified: Secondary | ICD-10-CM | POA: Diagnosis not present

## 2017-10-17 DIAGNOSIS — K219 Gastro-esophageal reflux disease without esophagitis: Secondary | ICD-10-CM | POA: Diagnosis not present

## 2017-10-17 DIAGNOSIS — J101 Influenza due to other identified influenza virus with other respiratory manifestations: Secondary | ICD-10-CM | POA: Diagnosis not present

## 2017-10-17 DIAGNOSIS — E039 Hypothyroidism, unspecified: Secondary | ICD-10-CM | POA: Diagnosis not present

## 2017-10-17 LAB — CBC WITH DIFFERENTIAL/PLATELET
Basophils Absolute: 0 10*3/uL (ref 0.0–0.1)
Basophils Relative: 0 %
Eosinophils Absolute: 0 10*3/uL (ref 0.0–0.7)
Eosinophils Relative: 0 %
HCT: 31.9 % — ABNORMAL LOW (ref 36.0–46.0)
Hemoglobin: 10.3 g/dL — ABNORMAL LOW (ref 12.0–15.0)
Lymphocytes Relative: 8 %
Lymphs Abs: 0.5 10*3/uL — ABNORMAL LOW (ref 0.7–4.0)
MCH: 28.9 pg (ref 26.0–34.0)
MCHC: 32.3 g/dL (ref 30.0–36.0)
MCV: 89.4 fL (ref 78.0–100.0)
Monocytes Absolute: 0.6 10*3/uL (ref 0.1–1.0)
Monocytes Relative: 9 %
Neutro Abs: 5.2 10*3/uL (ref 1.7–7.7)
Neutrophils Relative %: 83 %
Platelets: 290 10*3/uL (ref 150–400)
RBC: 3.57 MIL/uL — ABNORMAL LOW (ref 3.87–5.11)
RDW: 15 % (ref 11.5–15.5)
WBC: 6.3 10*3/uL (ref 4.0–10.5)

## 2017-10-17 LAB — HIV ANTIBODY (ROUTINE TESTING W REFLEX): HIV Screen 4th Generation wRfx: NONREACTIVE

## 2017-10-17 LAB — MRSA PCR SCREENING: MRSA by PCR: NEGATIVE

## 2017-10-17 MED ORDER — PREDNISONE 20 MG PO TABS
40.0000 mg | ORAL_TABLET | Freq: Every day | ORAL | Status: DC
  Administered 2017-10-17 – 2017-10-18 (×2): 40 mg via ORAL
  Filled 2017-10-17 (×2): qty 2

## 2017-10-17 MED ORDER — SODIUM CHLORIDE 3 % IN NEBU
4.0000 mL | INHALATION_SOLUTION | Freq: Two times a day (BID) | RESPIRATORY_TRACT | Status: DC
  Administered 2017-10-17 (×2): 4 mL via RESPIRATORY_TRACT
  Filled 2017-10-17 (×2): qty 4

## 2017-10-17 MED ORDER — OXYCODONE-ACETAMINOPHEN 7.5-325 MG PO TABS
1.0000 | ORAL_TABLET | Freq: Three times a day (TID) | ORAL | Status: DC | PRN
  Administered 2017-10-17: 1 via ORAL
  Filled 2017-10-17: qty 1

## 2017-10-17 MED ORDER — BISACODYL 5 MG PO TBEC
5.0000 mg | DELAYED_RELEASE_TABLET | Freq: Every day | ORAL | Status: DC | PRN
  Administered 2017-10-17: 5 mg via ORAL
  Filled 2017-10-17: qty 1

## 2017-10-17 MED ORDER — MIDODRINE HCL 5 MG PO TABS
10.0000 mg | ORAL_TABLET | Freq: Three times a day (TID) | ORAL | Status: DC
  Administered 2017-10-17 – 2017-10-18 (×4): 10 mg via ORAL
  Filled 2017-10-17 (×4): qty 2

## 2017-10-17 MED ORDER — SODIUM CHLORIDE 3 % IN NEBU
4.0000 mL | INHALATION_SOLUTION | Freq: Two times a day (BID) | RESPIRATORY_TRACT | Status: DC
  Administered 2017-10-18: 4 mL via RESPIRATORY_TRACT
  Filled 2017-10-17: qty 4

## 2017-10-17 MED ORDER — IPRATROPIUM-ALBUTEROL 0.5-2.5 (3) MG/3ML IN SOLN
3.0000 mL | Freq: Four times a day (QID) | RESPIRATORY_TRACT | Status: DC
  Administered 2017-10-18 (×2): 3 mL via RESPIRATORY_TRACT
  Filled 2017-10-17 (×3): qty 3

## 2017-10-17 NOTE — Progress Notes (Signed)
PROGRESS NOTE    Kathleen [name]  JKK:[id number] DOB: 03/27/1961 DOA: 10/15/2017 PCP: [name], MD    Brief Narrative:  57 year old female with a history of sarcoidosis, adrenal insufficiency, was sent to the hospital from her primary care physician's office due to nausea, vomiting and hypotension.  She had fever, chills, fatigue and malaise.  She tested positive for influenza earlier in the week and had been taking Tamiflu.  She is chronically on prednisone, but was slightly unable to take it due to persistent vomiting.  She was found to be hypotensive on arrival.  Started on IV fluids, intravenous Solu-Cortef and continued on Tamiflu.   Assessment & Plan:   Principal Problem:   Acute adrenal crisis [facility]) Active Problems:   Sarcoidosis   Hyperlipidemia   Nausea and vomiting   Hypotension   Gastroesophageal reflux disease   Influenza   1. Acute adrenal crisis.  Patient had significant nausea and vomiting related to her viral illness and was unable to take prednisone.  She was started on intravenous solucortef and has started to improve.  She was significantly hypotensive on admission, but overall blood pressures have improved with steroids and IV fluids.  Will discontinue IV steroids and transition to prednisone. Continue to monitor 2. Hypotension.  Suspect this is related to dehydration/volume depletion, as well as possibly adrenal crisis.  Patient was treated with IV fluids, midodrine as well as intravenous steroids.  Overall blood pressures appear to be improving.  Last ejection fraction system is from 09/2016 showed a normal ejection fraction. 3. Nausea and vomiting.  Suspect is related to viral illness.  Continue antiemetics and advance diet as tolerated. Appears to be improving 4. Influenza A.  Currently on Tamiflu. 5. Sarcoidosis.  Chronically on prednisone.  Currently on IV steroids, but will try transitioning back to prednisone today.  Continue on inhaled steroids as well as  bronchodilators. 6. GERD.  Continue on PPI   DVT prophylaxis: Lovenox Code Status: Full code Family Communication: Discussed with husband at the bedside Disposition Plan: Discharge home once improved   Consultants:     Procedures:     Antimicrobials:       Subjective: Feeling better today. Still has nonproductive cough. Having difficulty expectorating sputum. Generalized weakness is better. Tolerating diet  Objective: Vitals:   10/17/17 1400 10/17/17 1500 10/17/17 1600 10/17/17 1613  BP: (!) 104/32 (!) 106/48 (!) 111/58   Pulse: (!) 53 73 63 62  Resp: 19 (!) 23 (!) 23 (!) 26  Temp:    98.7 F (37.1 C)  TempSrc:    Oral  SpO2: 100% 93% 96% 95%  Weight:      Height:        Intake/Output Summary (Last 24 hours) at 10/17/2017 1647 Last data filed at 10/17/2017 1200 Gross per 24 hour  Intake 1160 ml  Output 1400 ml  Net -240 ml   Filed Weights   10/16/17 2225 10/17/17 0400  Weight: 102.2 kg (225 lb 5 oz) 93.1 kg (205 lb 4 oz)    Examination:  General exam: Alert, awake, oriented x 3 Respiratory system: Clear to auscultation. Respiratory effort normal. Cardiovascular system:RRR. No murmurs, rubs, gallops. Gastrointestinal system: Abdomen is nondistended, soft and nontender. No organomegaly or masses felt. Normal bowel sounds heard. Central nervous system: Alert and oriented. No focal neurological deficits. Extremities: No C/C/E, +pedal pulses Skin: No rashes, lesions or ulcers Psychiatry: Judgement and insight appear normal. Mood & affect appropriate.       Data  Reviewed: I have personally reviewed following labs and imaging studies  CBC: Recent Labs  Lab 10/15/17 1738 10/16/17 0513 10/17/17 0606  WBC 6.9 5.5 6.3  NEUTROABS 5.5 5.0 5.2  HGB 12.2 10.9* 10.3*  HCT 38.4 34.3* 31.9*  MCV 88.1 88.9 89.4  PLT 348 294 542   Basic Metabolic Panel: Recent Labs  Lab 10/15/17 1738 10/16/17 0513  NA 136 139  K 3.8 4.2  CL 103 109  CO2 22 22    GLUCOSE 97 148*  BUN 12 11  CREATININE 1.03* 0.91  CALCIUM 9.1 8.6*  MG 2.0  --   PHOS 3.4  --    GFR: Estimated Creatinine Clearance: 86.3 mL/min (by C-G formula based on SCr of 0.91 mg/dL). Liver Function Tests: Recent Labs  Lab 10/16/17 0513  AST 17  ALT 15  ALKPHOS 56  BILITOT 0.3  PROT 6.3*  ALBUMIN 3.3*   No results for input(s): LIPASE, AMYLASE in the last 168 hours. No results for input(s): AMMONIA in the last 168 hours. Coagulation Profile: No results for input(s): INR, PROTIME in the last 168 hours. Cardiac Enzymes: Recent Labs  Lab 10/15/17 1738  TROPONINI <0.03   BNP (last 3 results) No results for input(s): PROBNP in the last 8760 hours. HbA1C: No results for input(s): HGBA1C in the last 72 hours. CBG: No results for input(s): GLUCAP in the last 168 hours. Lipid Profile: No results for input(s): CHOL, HDL, LDLCALC, TRIG, CHOLHDL, LDLDIRECT in the last 72 hours. Thyroid Function Tests: Recent Labs    10/15/17 2203  TSH 0.769   Anemia Panel: No results for input(s): VITAMINB12, FOLATE, FERRITIN, TIBC, IRON, RETICCTPCT in the last 72 hours. Sepsis Labs: No results for input(s): PROCALCITON, LATICACIDVEN in the last 168 hours.  Recent Results (from the past 240 hour(s))  Rapid strep screen     Status: None   Collection Time: 10/15/17  8:54 PM  Result Value Ref Range Status   Streptococcus, Group A Screen (Direct) NEGATIVE NEGATIVE Final    Comment: (NOTE) A Rapid Antigen test may result negative if the antigen level in the sample is below the detection level of this test. The FDA has not cleared this test as a stand-alone test therefore the rapid antigen negative result has reflexed to a Group A Strep culture. Performed at [facility], [address] [place], [place] [address]   Culture, group A strep     Status: None (Preliminary result)   Collection Time: 10/15/17  8:54 PM  Result Value Ref Range Status   Specimen Description   Final     THROAT Performed at [facility], [address], [place], [place] [address]    Special Requests   Final    NONE Reflexed from [id number] Performed at [facility], [address], [place], [place] [address]    Culture   Final    CULTURE REINCUBATED FOR BETTER GROWTH Performed at [facility], [place] [address], [place], [place] [address]    Report Status PENDING  Incomplete  Respiratory Panel by PCR     Status: Abnormal   Collection Time: 10/15/17 10:08 PM  Result Value Ref Range Status   Adenovirus NOT DETECTED NOT DETECTED Final   Coronavirus 229E NOT DETECTED NOT DETECTED Final   Coronavirus HKU1 DETECTED (A) NOT DETECTED Final   Coronavirus NL63 NOT DETECTED NOT DETECTED Final   Coronavirus OC43 DETECTED (A) NOT DETECTED Final   Metapneumovirus NOT DETECTED NOT DETECTED Final   Rhinovirus /  Enterovirus NOT DETECTED NOT DETECTED Final   Influenza A H3 DETECTED (A) NOT DETECTED Final   Influenza B NOT DETECTED NOT DETECTED Final   Parainfluenza Virus 1 NOT DETECTED NOT DETECTED Final   Parainfluenza Virus 2 NOT DETECTED NOT DETECTED Final   Parainfluenza Virus 3 NOT DETECTED NOT DETECTED Final   Parainfluenza Virus 4 NOT DETECTED NOT DETECTED Final   Respiratory Syncytial Virus NOT DETECTED NOT DETECTED Final   Bordetella pertussis NOT DETECTED NOT DETECTED Final   Chlamydophila pneumoniae NOT DETECTED NOT DETECTED Final   Mycoplasma pneumoniae NOT DETECTED NOT DETECTED Final    Comment: Performed at [facility], [place] [address], [place], [place] [address]  MRSA PCR Screening     Status: None   Collection Time: 10/16/17 11:22 PM  Result Value Ref Range Status   MRSA by PCR NEGATIVE NEGATIVE Final    Comment:        The GeneXpert MRSA Assay (FDA approved for NASAL specimens only), is one component of a comprehensive MRSA colonization surveillance program. It is not intended to diagnose MRSA infection nor to guide or monitor treatment for MRSA infections. Performed  at [facility], [address] [place], [place] [address]          Radiology Studies: Dg Chest 2 View  Result Date: 10/15/2017 CLINICAL DATA:  Cough and congestion EXAM: CHEST  2 VIEW COMPARISON:  September 30, 2017 and October 20, 2015 FINDINGS: There is scarring in the left upper lobe posteriorly. A lesser degree of scarring is noted in the left base and right upper lobe. There is no edema or consolidation. The heart size and pulmonary vascularity are normal. No adenopathy. There is aortic atherosclerosis. No evident bone lesions. IMPRESSION: Areas of scarring, most notably in the posterior segment of the left upper lobe. No edema or consolidation. Heart size normal. There is aortic atherosclerosis. Aortic Atherosclerosis (ICD10-I70.0). Electronically Signed   By: Lowella Grip III M.D.   On: 10/15/2017 17:35        Scheduled Meds: . budesonide  0.5 mg Nebulization BID  . enoxaparin (LOVENOX) injection  40 mg Subcutaneous Q24H  . ferrous sulfate  325 mg Oral Q breakfast  . guaiFENesin  1,200 mg Oral BID  . ipratropium-albuterol  3 mL Nebulization Q6H  . levothyroxine  75 mcg Oral QAC breakfast  . midodrine  10 mg Oral TID WC  . oseltamivir  75 mg Oral BID  . pantoprazole  40 mg Oral Daily  . predniSONE  40 mg Oral Q breakfast  . sodium chloride HYPERTONIC  4 mL Nebulization BID   Continuous Infusions:    LOS: 2 days    Time spent: 78mins    [name], MD [facility] Pager [phone]  If 7PM-7AM, please contact night-coverage [email or web address] Password [email or web address] 10/17/2017, 4:47 PM

## 2017-10-18 LAB — CBC WITH DIFFERENTIAL/PLATELET
Basophils Absolute: 0 10*3/uL (ref 0.0–0.1)
Basophils Relative: 0 %
Eosinophils Absolute: 0 10*3/uL (ref 0.0–0.7)
Eosinophils Relative: 0 %
HCT: 32.2 % — ABNORMAL LOW (ref 36.0–46.0)
Hemoglobin: 10.2 g/dL — ABNORMAL LOW (ref 12.0–15.0)
Lymphocytes Relative: 9 %
Lymphs Abs: 0.7 10*3/uL (ref 0.7–4.0)
MCH: 28.7 pg (ref 26.0–34.0)
MCHC: 31.7 g/dL (ref 30.0–36.0)
MCV: 90.4 fL (ref 78.0–100.0)
Monocytes Absolute: 0.4 10*3/uL (ref 0.1–1.0)
Monocytes Relative: 6 %
Neutro Abs: 6.2 10*3/uL (ref 1.7–7.7)
Neutrophils Relative %: 85 %
Platelets: 306 10*3/uL (ref 150–400)
RBC: 3.56 MIL/uL — ABNORMAL LOW (ref 3.87–5.11)
RDW: 15.1 % (ref 11.5–15.5)
WBC: 7.2 10*3/uL (ref 4.0–10.5)

## 2017-10-18 LAB — CULTURE, GROUP A STREP (THRC)

## 2017-10-18 MED ORDER — PREDNISONE 10 MG PO TABS: ORAL_TABLET | ORAL | 0 refills | Status: DC

## 2017-10-18 MED ORDER — DM-GUAIFENESIN ER 30-600 MG PO TB12: 1.0000 | ORAL_TABLET | Freq: Two times a day (BID) | ORAL | Status: DC

## 2017-10-18 MED ORDER — BENZONATATE 100 MG PO CAPS: 100.0000 mg | ORAL_CAPSULE | Freq: Three times a day (TID) | ORAL | 0 refills | Status: DC | PRN

## 2017-10-18 MED ORDER — MIDODRINE HCL 10 MG PO TABS: 10.0000 mg | ORAL_TABLET | Freq: Three times a day (TID) | ORAL | 0 refills | Status: DC

## 2017-10-18 MED ORDER — BUDESONIDE 0.5 MG/2ML IN SUSP: 0.5000 mg | Freq: Two times a day (BID) | RESPIRATORY_TRACT | 6 refills | Status: DC

## 2017-10-18 MED FILL — MIDODRINE HCL 10 MG TABLET: 10 | 30 days supply | Qty: 90 | Fill #0

## 2017-10-18 MED FILL — BENZONATATE 100 MG CAP: 100 | 10 days supply | Qty: 30 | Fill #0

## 2017-10-18 NOTE — Care Management Note (Signed)
Case Management Note  Patient Details  Name: Kathleen [name] MRN: [id number] Date of Birth: 1961/07/22    Expected Discharge Date:  10/18/17               Expected Discharge Plan:  [place]  In-House Referral:     Discharge planning Services  CM Consult  Post Acute Care Choice:  Home Health Choice offered to:  Patient  DME Arranged:    DME Agency:     HH Arranged:  RN, PT, Respirator Therapy [place] Agency:     Status of Service:  Completed, signed off  If discussed at [place] of Stay Meetings, dates discussed:    Additional Comments:Pt discharging home today with [facility] PT, RN and will need overnight oximetry testing. Pt. has chosen [facility] from [facility] agency list. Pt is aware HH has 48 hours to make first visit. Kathleen [name], of [facility], made aware of referral and will obtain pt info from chart.   Kathleen [name], Chauncey [place], RN 10/18/2017, 12:51 PM

## 2017-10-18 NOTE — Discharge Summary (Signed)
Physician Discharge Summary  TANZANIA BASHAM QPY:[id number] DOB: 10-12-1960 DOA: 10/15/2017  PCP: [name], MD  Admit date: 10/15/2017 Discharge date: 10/18/2017  Admitted From:home Disposition:  home  Recommendations for Outpatient Follow-up:  1. Follow up with PCP in 1-2 weeks 2. Please obtain BMP/CBC in one week  Home Health: HHRN, PT, RT Equipment/Devices:  Discharge Condition:stable CODE STATUS: full code Diet recommendation: Heart Healthy   Brief/Interim Summary: 57 year old female with a history of sarcoidosis, adrenal insufficiency, was sent to the hospital from her primary care physician's office due to nausea, vomiting and hypotension.  She had fever, chills, fatigue and malaise.  She tested positive for influenza earlier in the week and had been taking Tamiflu.  She is chronically on prednisone, but was slightly unable to take it due to persistent vomiting.  She was found to be hypotensive on arrival.  Started on IV fluids, intravenous Solu-Cortef and continued on Tamiflu.  Discharge Diagnoses:  Principal Problem:   Acute adrenal crisis [facility]) Active Problems:   Sarcoidosis   Hyperlipidemia   Nausea and vomiting   Hypotension   Gastroesophageal reflux disease   Influenza  1. Acute adrenal crisis.  Patient has been having significant nausea and vomiting due to her viral illness prior to admission.  She was unable to take her chronic prednisone.  She was noted to be hypotensive on arrival and started on intravenous hydrocortisone.  With IV fluids and steroids, she did improve.  She has since been placed back on prednisone taper. 2. Hypotension.  Related to dehydration/volume depletion as well as adrenal crisis.  Patient is chronically on midodrine and was continued on a higher dose of this.  She was treated with IV fluids and overall blood pressures have improved.  She does still have some low blood pressures, but these occur mostly while sleeping.  While she is awake  they appear to be in reasonable range and she is asymptomatic.  She is able to ambulate without feeling dizzy. 3. Nausea and vomiting.  Related to viral illness.  Treat supportively with antiemetics.  Now tolerating a solid diet. 4. Influenza A.  Treated with Tamiflu 5. Sarcoidosis.  Chronically on steroids.  Continue on inhaled steroids as well as bronchodilators.  Patient is concerned that she may have had some hypoxia overnight, although this I do not see this documented in the chart.  We will set up for overnight pulse oximetry with [facility] home [facility]. 6. GERD.  Continue on PPI. 7. Hip pain.  She has chronic pain on opiates due to avascular necrosis of the hip.  She ambulates with a walker.  Will request home health PT.  Discharge Instructions  Discharge Instructions    Diet - low sodium heart healthy   Complete by:  As directed    Increase activity slowly   Complete by:  As directed      Allergies as of 10/18/2017      Reactions   Bee Venom Anaphylaxis   Other Other (See Comments)   Contrast Dye CT chest w/ contrast at OSH followed by SOB resolved w/ single dose steroids, never ENT swelling, intubation or pressors, has had multiple contrasted scans since   Codeine Itching   Other reaction(s): ITCHING   Morphine And Related Other (See Comments)   Unknown reaction- pt gets sick, dizzy, and confused Intolerance, not an allergy, received this in the emergency department recently without a problem.      Medication List    TAKE these medications  acetaminophen 325 MG tablet Commonly known as:  TYLENOL Take 2 tablets (650 mg total) by mouth every 6 (six) hours as needed for mild pain (or Fever >/= 101).   albuterol (2.5 MG/3ML) 0.083% nebulizer solution Commonly known as:  PROVENTIL Take 3 mLs (2.5 mg total) by nebulization every 2 (two) hours as needed for wheezing or shortness of breath.   benzonatate 100 MG capsule Commonly known as:  TESSALON PERLES Take 1 capsule (100 mg  total) by mouth 3 (three) times daily as needed for cough.   budesonide 0.5 MG/2ML nebulizer solution Commonly known as:  PULMICORT Take 2 mLs (0.5 mg total) by nebulization 2 (two) times daily. What changed:    when to take this  reasons to take this   dextromethorphan-guaiFENesin 30-600 MG 12hr tablet Commonly known as:  MUCINEX DM Take 1 tablet by mouth 2 (two) times daily.   EMERGEN-C IMMUNE PO Take 1-2 each by mouth daily. GUMMIES   ferrous sulfate 325 (65 FE) MG EC tablet Take 325 mg by mouth daily with breakfast.   levothyroxine 75 MCG tablet Commonly known as:  SYNTHROID, LEVOTHROID Take 1 tablet (75 mcg total) by mouth daily before breakfast.   midodrine 10 MG tablet Commonly known as:  PROAMATINE Take 1 tablet (10 mg total) by mouth 3 (three) times daily with meals. What changed:    medication strength  how much to take  when to take this   ondansetron 8 MG disintegrating tablet Commonly known as:  ZOFRAN ODT Take 1 tablet (8 mg total) by mouth every 8 (eight) hours as needed for nausea or vomiting.   oseltamivir 75 MG capsule Commonly known as:  TAMIFLU Take 1 capsule (75 mg total) by mouth 2 (two) times daily.   oxyCODONE-acetaminophen 7.5-325 MG tablet Commonly known as:  PERCOCET Take 1 tablet by mouth 3 (three) times daily as needed for severe pain.   pantoprazole 40 MG tablet Commonly known as:  PROTONIX TAKE 1 TABLET BY MOUTH ONCE DAILY   predniSONE 10 MG tablet Commonly known as:  DELTASONE Take 30mg  daily for 1 day then 20mg  daily for 1 day then 10mg  daily What changed:    how much to take  how to take this  when to take this  additional instructions   promethazine 25 MG tablet Commonly known as:  PHENERGAN Take 1 tablet (25 mg total) by mouth every 6 (six) hours as needed for nausea or vomiting.       Allergies  Allergen Reactions  . Bee Venom Anaphylaxis  . Other Other (See Comments)    Contrast Dye CT chest w/ contrast  at OSH followed by SOB resolved w/ single dose steroids, never ENT swelling, intubation or pressors, has had multiple contrasted scans since  . Codeine Itching    Other reaction(s): ITCHING  . Morphine And Related Other (See Comments)    Unknown reaction- pt gets sick, dizzy, and confused Intolerance, not an allergy, received this in the emergency department recently without a problem.    Consultations:     Procedures/Studies: Dg Chest 2 View  Result Date: 10/15/2017 CLINICAL DATA:  Cough and congestion EXAM: CHEST  2 VIEW COMPARISON:  September 30, 2017 and October 20, 2015 FINDINGS: There is scarring in the left upper lobe posteriorly. A lesser degree of scarring is noted in the left base and right upper lobe. There is no edema or consolidation. The heart size and pulmonary vascularity are normal. No adenopathy. There is aortic atherosclerosis. No  evident bone lesions. IMPRESSION: Areas of scarring, most notably in the posterior segment of the left upper lobe. No edema or consolidation. Heart size normal. There is aortic atherosclerosis. Aortic Atherosclerosis (ICD10-I70.0). Electronically Signed   By: Lowella Grip III M.D.   On: 10/15/2017 17:35   Dg Chest 2 View  Result Date: 09/30/2017 CLINICAL DATA:  Shortness of breath and chest pain. History of sarcoidosis EXAM: CHEST  2 VIEW COMPARISON:  August 08, 2017 and October 20, 2015 FINDINGS: There is chronic opacification in the posterior aspect of the left upper lobe better seen on lateral view. Milder scarring is noted in the right upper lobe. There is no edema or consolidation. Heart size and pulmonary vascularity are normal. No adenopathy. No bone lesions. IMPRESSION: Chronic scarring, most notable in the posterior segment of the left upper lobe, likely residua from known chronic sarcoidosis. No new opacity. Heart size normal. No adenopathy appreciable. Electronically Signed   By: Lowella Grip III M.D.   On: 09/30/2017 20:58        Subjective: Feeling better. No nausea or vomiting. Still having difficulty bringing up sputum with cough. Didn't sleep well last night due to cough  Discharge Exam: Vitals:   10/18/17 0800 10/18/17 0818  BP: 104/87   Pulse: 60   Resp: 18   Temp:  97.6 F (36.4 C)  SpO2: 100%    Vitals:   10/18/17 0727 10/18/17 0752 10/18/17 0800 10/18/17 0818  BP:   104/87   Pulse:   60   Resp:   18   Temp:    97.6 F (36.4 C)  TempSrc:    Oral  SpO2: 94% 96% 100%   Weight:      Height:        General: Pt is alert, awake, not in acute distress Cardiovascular: RRR, S1/S2 +, no rubs, no gallops Respiratory: CTA bilaterally, no wheezing, no rhonchi Abdominal: Soft, NT, ND, bowel sounds + Extremities: no edema, no cyanosis    The results of significant diagnostics from this hospitalization (including imaging, microbiology, ancillary and laboratory) are listed below for reference.     Microbiology: Recent Results (from the past 240 hour(s))  Rapid strep screen     Status: None   Collection Time: 10/15/17  8:54 PM  Result Value Ref Range Status   Streptococcus, Group A Screen (Direct) NEGATIVE NEGATIVE Final    Comment: (NOTE) A Rapid Antigen test may result negative if the antigen level in the sample is below the detection level of this test. The FDA has not cleared this test as a stand-alone test therefore the rapid antigen negative result has reflexed to a Group A Strep culture. Performed at [facility], [address] [place], [place] [address]   Culture, group A strep     Status: None (Preliminary result)   Collection Time: 10/15/17  8:54 PM  Result Value Ref Range Status   Specimen Description   Final    THROAT Performed at [facility], [address], [place], [place] [address]    Special Requests   Final    NONE Reflexed from [id number] Performed at [facility] [address] [place], [place] [address]    Culture   Final    CULTURE REINCUBATED FOR BETTER  GROWTH Performed at [facility], [place] [address] [place], [place] [address]    Report Status PENDING  Incomplete  Respiratory Panel by PCR     Status: Abnormal   Collection Time: 10/15/17 10:08  PM  Result Value Ref Range Status   Adenovirus NOT DETECTED NOT DETECTED Final   Coronavirus 229E NOT DETECTED NOT DETECTED Final   Coronavirus HKU1 DETECTED (A) NOT DETECTED Final   Coronavirus NL63 NOT DETECTED NOT DETECTED Final   Coronavirus OC43 DETECTED (A) NOT DETECTED Final   Metapneumovirus NOT DETECTED NOT DETECTED Final   Rhinovirus / Enterovirus NOT DETECTED NOT DETECTED Final   Influenza A H3 DETECTED (A) NOT DETECTED Final   Influenza B NOT DETECTED NOT DETECTED Final   Parainfluenza Virus 1 NOT DETECTED NOT DETECTED Final   Parainfluenza Virus 2 NOT DETECTED NOT DETECTED Final   Parainfluenza Virus 3 NOT DETECTED NOT DETECTED Final   Parainfluenza Virus 4 NOT DETECTED NOT DETECTED Final   Respiratory Syncytial Virus NOT DETECTED NOT DETECTED Final   Bordetella pertussis NOT DETECTED NOT DETECTED Final   Chlamydophila pneumoniae NOT DETECTED NOT DETECTED Final   Mycoplasma pneumoniae NOT DETECTED NOT DETECTED Final    Comment: Performed at [facility], [address] [place], [place] [address]  MRSA PCR Screening     Status: None   Collection Time: 10/16/17 11:22 PM  Result Value Ref Range Status   MRSA by PCR NEGATIVE NEGATIVE Final    Comment:        The GeneXpert MRSA Assay (FDA approved for NASAL specimens only), is one component of a comprehensive MRSA colonization surveillance program. It is not intended to diagnose MRSA infection nor to guide or monitor treatment for MRSA infections. Performed at [facility], [address] [place], [place] [address]      Labs: BNP (last 3 results) No results for input(s): BNP in the last 8760 hours. Basic Metabolic Panel: Recent Labs  Lab 10/15/17 1738 10/16/17 0513  NA 136 139  K 3.8 4.2  CL 103 109   CO2 22 22  GLUCOSE 97 148*  BUN 12 11  CREATININE 1.03* 0.91  CALCIUM 9.1 8.6*  MG 2.0  --   PHOS 3.4  --    Liver Function Tests: Recent Labs  Lab 10/16/17 0513  AST 17  ALT 15  ALKPHOS 56  BILITOT 0.3  PROT 6.3*  ALBUMIN 3.3*   No results for input(s): LIPASE, AMYLASE in the last 168 hours. No results for input(s): AMMONIA in the last 168 hours. CBC: Recent Labs  Lab 10/15/17 1738 10/16/17 0513 10/17/17 0606 10/18/17 0411  WBC 6.9 5.5 6.3 7.2  NEUTROABS 5.5 5.0 5.2 6.2  HGB 12.2 10.9* 10.3* 10.2*  HCT 38.4 34.3* 31.9* 32.2*  MCV 88.1 88.9 89.4 90.4  PLT 348 294 290 306   Cardiac Enzymes: Recent Labs  Lab 10/15/17 1738  TROPONINI <0.03   BNP: Invalid input(s): POCBNP CBG: No results for input(s): GLUCAP in the last 168 hours. D-Dimer No results for input(s): DDIMER in the last 72 hours. Hgb A1c No results for input(s): HGBA1C in the last 72 hours. Lipid Profile No results for input(s): CHOL, HDL, LDLCALC, TRIG, CHOLHDL, LDLDIRECT in the last 72 hours. Thyroid function studies Recent Labs    10/15/17 2203  TSH 0.769   Anemia work up No results for input(s): VITAMINB12, FOLATE, FERRITIN, TIBC, IRON, RETICCTPCT in the last 72 hours. Urinalysis    Component Value Date/Time   COLORURINE COLORLESS (A) 10/16/2017 0100   APPEARANCEUR CLEAR 10/16/2017 0100   LABSPEC 1.004 (L) 10/16/2017 0100   PHURINE 5.0 10/16/2017 0100   GLUCOSEU NEGATIVE 10/16/2017 0100   GLUCOSEU NEGATIVE 05/29/2010 1622   HGBUR  NEGATIVE 10/16/2017 0100   BILIRUBINUR NEGATIVE 10/16/2017 0100   BILIRUBINUR Positive 08/16/2015 0928   KETONESUR NEGATIVE 10/16/2017 0100   PROTEINUR NEGATIVE 10/16/2017 0100   UROBILINOGEN 0.2 06/16/2014 0942   NITRITE NEGATIVE 10/16/2017 0100   LEUKOCYTESUR NEGATIVE 10/16/2017 0100   Sepsis Labs Invalid input(s): PROCALCITONIN,  WBC,  LACTICIDVEN Microbiology Recent Results (from the past 240 hour(s))  Rapid strep screen     Status: None    Collection Time: 10/15/17  8:54 PM  Result Value Ref Range Status   Streptococcus, Group A Screen (Direct) NEGATIVE NEGATIVE Final    Comment: (NOTE) A Rapid Antigen test may result negative if the antigen level in the sample is below the detection level of this test. The FDA has not cleared this test as a stand-alone test therefore the rapid antigen negative result has reflexed to a Group A Strep culture. Performed at [facility], [address], [place], [place] [address]   Culture, group A strep     Status: None (Preliminary result)   Collection Time: 10/15/17  8:54 PM  Result Value Ref Range Status   Specimen Description   Final    THROAT Performed at [facility], [address], [place], [place] [address]    Special Requests   Final    NONE Reflexed from [id number] Performed at [facility], [address], [place], [place] [address]    Culture   Final    CULTURE REINCUBATED FOR BETTER GROWTH Performed at [facility], [place] [address] [place], [place] [address]    Report Status PENDING  Incomplete  Respiratory Panel by PCR     Status: Abnormal   Collection Time: 10/15/17 10:08 PM  Result Value Ref Range Status   Adenovirus NOT DETECTED NOT DETECTED Final   Coronavirus 229E NOT DETECTED NOT DETECTED Final   Coronavirus HKU1 DETECTED (A) NOT DETECTED Final   Coronavirus NL63 NOT DETECTED NOT DETECTED Final   Coronavirus OC43 DETECTED (A) NOT DETECTED Final   Metapneumovirus NOT DETECTED NOT DETECTED Final   Rhinovirus / Enterovirus NOT DETECTED NOT DETECTED Final   Influenza A H3 DETECTED (A) NOT DETECTED Final   Influenza B NOT DETECTED NOT DETECTED Final   Parainfluenza Virus 1 NOT DETECTED NOT DETECTED Final   Parainfluenza Virus 2 NOT DETECTED NOT DETECTED Final   Parainfluenza Virus 3 NOT DETECTED NOT DETECTED Final   Parainfluenza Virus 4 NOT DETECTED NOT DETECTED Final   Respiratory Syncytial Virus NOT DETECTED NOT DETECTED Final   Bordetella pertussis NOT  DETECTED NOT DETECTED Final   Chlamydophila pneumoniae NOT DETECTED NOT DETECTED Final   Mycoplasma pneumoniae NOT DETECTED NOT DETECTED Final    Comment: Performed at [facility], [place] [address] [place], [place] [address]  MRSA PCR Screening     Status: None   Collection Time: 10/16/17 11:22 PM  Result Value Ref Range Status   MRSA by PCR NEGATIVE NEGATIVE Final    Comment:        The GeneXpert MRSA Assay (FDA approved for NASAL specimens only), is one component of a comprehensive MRSA colonization surveillance program. It is not intended to diagnose MRSA infection nor to guide or monitor treatment for MRSA infections. Performed at [facility], [address], [place], [place] [address]      Time coordinating discharge: Over 30 minutes  SIGNED:   [name], MD  [facility] 10/18/2017, 10:30 AM Pager   If 7PM-7AM, please contact night-coverage [email or web address] Password [email or web address]

## 2017-10-18 NOTE — Care Management Important Message (Signed)
Important Message  Patient Details  Name: Kathleen [name] MRN: [id number] Date of Birth: 05/15/1961   Medicare Important Message Given:  Yes    Alixandra Alfieri, Chauncey [place], RN 10/18/2017, 12:55 PM

## 2017-10-19 ENCOUNTER — Telehealth: Payer: Self-pay | Admitting: Family Medicine

## 2017-10-19 DIAGNOSIS — K219 Gastro-esophageal reflux disease without esophagitis: Secondary | ICD-10-CM | POA: Diagnosis not present

## 2017-10-19 DIAGNOSIS — J984 Other disorders of lung: Secondary | ICD-10-CM | POA: Diagnosis not present

## 2017-10-19 DIAGNOSIS — I951 Orthostatic hypotension: Secondary | ICD-10-CM | POA: Diagnosis not present

## 2017-10-19 DIAGNOSIS — E785 Hyperlipidemia, unspecified: Secondary | ICD-10-CM | POA: Diagnosis not present

## 2017-10-19 DIAGNOSIS — G629 Polyneuropathy, unspecified: Secondary | ICD-10-CM | POA: Diagnosis not present

## 2017-10-19 DIAGNOSIS — J111 Influenza due to unidentified influenza virus with other respiratory manifestations: Secondary | ICD-10-CM | POA: Diagnosis not present

## 2017-10-19 DIAGNOSIS — D869 Sarcoidosis, unspecified: Secondary | ICD-10-CM | POA: Diagnosis not present

## 2017-10-19 DIAGNOSIS — E272 Addisonian crisis: Secondary | ICD-10-CM | POA: Diagnosis not present

## 2017-10-19 DIAGNOSIS — G8929 Other chronic pain: Secondary | ICD-10-CM | POA: Diagnosis not present

## 2017-10-19 NOTE — Telephone Encounter (Signed)
Patient recently discharged from hospital please make sure she does a follow-up visit within 7-10 days thank you-hospitalist recommend that the patient does do a follow-up

## 2017-10-21 ENCOUNTER — Other Ambulatory Visit: Payer: Self-pay

## 2017-10-21 ENCOUNTER — Telehealth: Payer: Self-pay | Admitting: Family Medicine

## 2017-10-21 ENCOUNTER — Emergency Department (HOSPITAL_COMMUNITY): Payer: 59

## 2017-10-21 ENCOUNTER — Encounter (HOSPITAL_COMMUNITY): Payer: Self-pay | Admitting: Emergency Medicine

## 2017-10-21 ENCOUNTER — Inpatient Hospital Stay (HOSPITAL_COMMUNITY)
Admission: EM | Admit: 2017-10-21 | Discharge: 2017-10-22 | DRG: 194 | Disposition: A | Discharge status: Home Health | Payer: 59 | Attending: Internal Medicine | Admitting: Internal Medicine

## 2017-10-21 DIAGNOSIS — E860 Dehydration: Secondary | ICD-10-CM | POA: Diagnosis not present

## 2017-10-21 DIAGNOSIS — R0602 Shortness of breath: Secondary | ICD-10-CM | POA: Diagnosis not present

## 2017-10-21 DIAGNOSIS — R05 Cough: Secondary | ICD-10-CM

## 2017-10-21 DIAGNOSIS — M797 Fibromyalgia: Secondary | ICD-10-CM | POA: Diagnosis present

## 2017-10-21 DIAGNOSIS — J111 Influenza due to unidentified influenza virus with other respiratory manifestations: Secondary | ICD-10-CM | POA: Diagnosis not present

## 2017-10-21 DIAGNOSIS — E876 Hypokalemia: Secondary | ICD-10-CM | POA: Diagnosis present

## 2017-10-21 DIAGNOSIS — I341 Nonrheumatic mitral (valve) prolapse: Secondary | ICD-10-CM | POA: Diagnosis present

## 2017-10-21 DIAGNOSIS — R0902 Hypoxemia: Secondary | ICD-10-CM | POA: Diagnosis not present

## 2017-10-21 DIAGNOSIS — Z91030 Bee allergy status: Secondary | ICD-10-CM

## 2017-10-21 DIAGNOSIS — Z885 Allergy status to narcotic agent status: Secondary | ICD-10-CM

## 2017-10-21 DIAGNOSIS — J101 Influenza due to other identified influenza virus with other respiratory manifestations: Secondary | ICD-10-CM

## 2017-10-21 DIAGNOSIS — R0600 Dyspnea, unspecified: Secondary | ICD-10-CM | POA: Diagnosis not present

## 2017-10-21 DIAGNOSIS — K219 Gastro-esophageal reflux disease without esophagitis: Secondary | ICD-10-CM | POA: Diagnosis present

## 2017-10-21 DIAGNOSIS — D860 Sarcoidosis of lung: Secondary | ICD-10-CM | POA: Diagnosis not present

## 2017-10-21 DIAGNOSIS — G4733 Obstructive sleep apnea (adult) (pediatric): Secondary | ICD-10-CM | POA: Diagnosis present

## 2017-10-21 DIAGNOSIS — E2740 Unspecified adrenocortical insufficiency: Secondary | ICD-10-CM | POA: Diagnosis present

## 2017-10-21 DIAGNOSIS — R059 Cough, unspecified: Secondary | ICD-10-CM

## 2017-10-21 DIAGNOSIS — D649 Anemia, unspecified: Secondary | ICD-10-CM | POA: Diagnosis present

## 2017-10-21 DIAGNOSIS — E039 Hypothyroidism, unspecified: Secondary | ICD-10-CM | POA: Diagnosis present

## 2017-10-21 DIAGNOSIS — Z8249 Family history of ischemic heart disease and other diseases of the circulatory system: Secondary | ICD-10-CM

## 2017-10-21 DIAGNOSIS — G894 Chronic pain syndrome: Secondary | ICD-10-CM | POA: Diagnosis present

## 2017-10-21 DIAGNOSIS — Z888 Allergy status to other drugs, medicaments and biological substances status: Secondary | ICD-10-CM

## 2017-10-21 DIAGNOSIS — Z7951 Long term (current) use of inhaled steroids: Secondary | ICD-10-CM

## 2017-10-21 DIAGNOSIS — Z79899 Other long term (current) drug therapy: Secondary | ICD-10-CM

## 2017-10-21 DIAGNOSIS — J09X2 Influenza due to identified novel influenza A virus with other respiratory manifestations: Principal | ICD-10-CM | POA: Diagnosis present

## 2017-10-21 DIAGNOSIS — R079 Chest pain, unspecified: Secondary | ICD-10-CM | POA: Diagnosis not present

## 2017-10-21 DIAGNOSIS — G629 Polyneuropathy, unspecified: Secondary | ICD-10-CM | POA: Diagnosis present

## 2017-10-21 DIAGNOSIS — Z79890 Hormone replacement therapy (postmenopausal): Secondary | ICD-10-CM

## 2017-10-21 DIAGNOSIS — Z9119 Patient's noncompliance with other medical treatment and regimen: Secondary | ICD-10-CM

## 2017-10-21 LAB — CBC WITH DIFFERENTIAL/PLATELET
Basophils Absolute: 0 10*3/uL (ref 0.0–0.1)
Basophils Relative: 0 %
Eosinophils Absolute: 0.1 10*3/uL (ref 0.0–0.7)
Eosinophils Relative: 1 %
HCT: 35.8 % — ABNORMAL LOW (ref 36.0–46.0)
Hemoglobin: 11.5 g/dL — ABNORMAL LOW (ref 12.0–15.0)
Lymphocytes Relative: 10 %
Lymphs Abs: 0.9 10*3/uL (ref 0.7–4.0)
MCH: 28 pg (ref 26.0–34.0)
MCHC: 32.1 g/dL (ref 30.0–36.0)
MCV: 87.3 fL (ref 78.0–100.0)
Monocytes Absolute: 0.9 10*3/uL (ref 0.1–1.0)
Monocytes Relative: 9 %
Neutro Abs: 7.7 10*3/uL (ref 1.7–7.7)
Neutrophils Relative %: 80 %
Platelets: 343 10*3/uL (ref 150–400)
RBC: 4.1 MIL/uL (ref 3.87–5.11)
RDW: 14.3 % (ref 11.5–15.5)
WBC: 9.6 10*3/uL (ref 4.0–10.5)

## 2017-10-21 LAB — COMPREHENSIVE METABOLIC PANEL
ALT: 13 U/L — ABNORMAL LOW (ref 14–54)
AST: 14 U/L — ABNORMAL LOW (ref 15–41)
Albumin: 3.2 g/dL — ABNORMAL LOW (ref 3.5–5.0)
Alkaline Phosphatase: 54 U/L (ref 38–126)
Anion gap: 11 (ref 5–15)
BUN: 12 mg/dL (ref 6–20)
CO2: 26 mmol/L (ref 22–32)
Calcium: 9 mg/dL (ref 8.9–10.3)
Chloride: 102 mmol/L (ref 101–111)
Creatinine, Ser: 0.89 mg/dL (ref 0.44–1.00)
GFR calc Af Amer: >60 mL/min (ref >60)
GFR calc non Af Amer: >60 mL/min (ref >60)
Glucose, Bld: 91 mg/dL (ref 65–99)
Potassium: 3.4 mmol/L — ABNORMAL LOW (ref 3.5–5.1)
Sodium: 139 mmol/L (ref 135–145)
Total Bilirubin: 0.3 mg/dL (ref 0.3–1.2)
Total Protein: 6.5 g/dL (ref 6.5–8.1)

## 2017-10-21 MED ORDER — DM-GUAIFENESIN ER 30-600 MG PO TB12
1.0000 | ORAL_TABLET | Freq: Two times a day (BID) | ORAL | Status: DC
  Administered 2017-10-22: 1 via ORAL
  Filled 2017-10-21 (×2): qty 1

## 2017-10-21 MED ORDER — MIDODRINE HCL 5 MG PO TABS
10.0000 mg | ORAL_TABLET | Freq: Three times a day (TID) | ORAL | Status: DC
  Administered 2017-10-22: 10 mg via ORAL
  Filled 2017-10-21: qty 2

## 2017-10-21 MED ORDER — SODIUM CHLORIDE 0.9 % IV SOLN
500.0000 mg | INTRAVENOUS | Status: DC
  Administered 2017-10-21: 500 mg via INTRAVENOUS
  Filled 2017-10-21 (×2): qty 500

## 2017-10-21 MED ORDER — SODIUM CHLORIDE 0.9% FLUSH
3.0000 mL | Freq: Two times a day (BID) | INTRAVENOUS | Status: DC
  Administered 2017-10-22: 3 mL via INTRAVENOUS

## 2017-10-21 MED ORDER — SODIUM CHLORIDE 0.9 % IV SOLN
250.0000 mL | INTRAVENOUS | Status: DC | PRN
Start: 2017-10-21 — End: 2017-10-22

## 2017-10-21 MED ORDER — OXYCODONE-ACETAMINOPHEN 7.5-325 MG PO TABS
1.0000 | ORAL_TABLET | Freq: Three times a day (TID) | ORAL | Status: DC | PRN
Start: 2017-10-21 — End: 2017-10-22
  Administered 2017-10-21 – 2017-10-22 (×2): 1 via ORAL
  Filled 2017-10-21 (×4): qty 1

## 2017-10-21 MED ORDER — ENOXAPARIN SODIUM 40 MG/0.4ML ~~LOC~~ SOLN: 40.0000 mg | SUBCUTANEOUS | Status: DC

## 2017-10-21 MED ORDER — IPRATROPIUM-ALBUTEROL 0.5-2.5 (3) MG/3ML IN SOLN
3.0000 mL | Freq: Once | RESPIRATORY_TRACT | Status: AC
Start: 2017-10-21 — End: 2017-10-21
  Administered 2017-10-21: 3 mL via RESPIRATORY_TRACT
  Filled 2017-10-21: qty 3

## 2017-10-21 MED ORDER — PANTOPRAZOLE SODIUM 40 MG PO TBEC
40.0000 mg | DELAYED_RELEASE_TABLET | Freq: Every day | ORAL | Status: DC
  Administered 2017-10-22: 40 mg via ORAL
  Filled 2017-10-21: qty 1

## 2017-10-21 MED ORDER — OSELTAMIVIR PHOSPHATE 75 MG PO CAPS
75.0000 mg | ORAL_CAPSULE | Freq: Two times a day (BID) | ORAL | Status: DC
  Administered 2017-10-22: 75 mg via ORAL
  Filled 2017-10-21: qty 1

## 2017-10-21 MED ORDER — ACETAMINOPHEN 650 MG RE SUPP: 650.0000 mg | Freq: Four times a day (QID) | RECTAL | Status: DC | PRN

## 2017-10-21 MED ORDER — LEVOTHYROXINE SODIUM 75 MCG PO TABS
75.0000 ug | ORAL_TABLET | Freq: Every day | ORAL | Status: DC
  Administered 2017-10-22: 75 ug via ORAL
  Filled 2017-10-21: qty 1

## 2017-10-21 MED ORDER — METHYLPREDNISOLONE SODIUM SUCC 125 MG IJ SOLR
125.0000 mg | Freq: Once | INTRAMUSCULAR | Status: AC
Start: 2017-10-21 — End: 2017-10-21
  Administered 2017-10-21: 125 mg via INTRAVENOUS
  Filled 2017-10-21: qty 2

## 2017-10-21 MED ORDER — SODIUM CHLORIDE 0.9% FLUSH: 3.0000 mL | INTRAVENOUS | Status: DC | PRN

## 2017-10-21 MED ORDER — BENZONATATE 100 MG PO CAPS: 100.0000 mg | ORAL_CAPSULE | Freq: Three times a day (TID) | ORAL | Status: DC | PRN

## 2017-10-21 MED ORDER — IPRATROPIUM-ALBUTEROL 0.5-2.5 (3) MG/3ML IN SOLN
3.0000 mL | Freq: Once | RESPIRATORY_TRACT | Status: AC
  Administered 2017-10-21: 3 mL via RESPIRATORY_TRACT
  Filled 2017-10-21: qty 3

## 2017-10-21 MED ORDER — FERROUS SULFATE 325 (65 FE) MG PO TABS
325.0000 mg | ORAL_TABLET | Freq: Every day | ORAL | Status: DC
  Administered 2017-10-22: 325 mg via ORAL
  Filled 2017-10-21: qty 1

## 2017-10-21 MED ORDER — PREDNISONE 20 MG PO TABS
60.0000 mg | ORAL_TABLET | Freq: Every day | ORAL | Status: DC
  Administered 2017-10-22: 60 mg via ORAL
  Filled 2017-10-21: qty 3

## 2017-10-21 MED ORDER — PROMETHAZINE HCL 12.5 MG PO TABS: 25.0000 mg | ORAL_TABLET | Freq: Four times a day (QID) | ORAL | Status: DC | PRN

## 2017-10-21 MED ORDER — ACETAMINOPHEN 325 MG PO TABS
650.0000 mg | ORAL_TABLET | Freq: Once | ORAL | Status: AC
  Administered 2017-10-21: 650 mg via ORAL
  Filled 2017-10-21: qty 2

## 2017-10-21 MED ORDER — BUDESONIDE 0.5 MG/2ML IN SUSP
0.5000 mg | Freq: Two times a day (BID) | RESPIRATORY_TRACT | Status: DC
  Administered 2017-10-21 – 2017-10-22 (×2): 0.5 mg via RESPIRATORY_TRACT
  Filled 2017-10-21 (×2): qty 2

## 2017-10-21 MED ORDER — ACETAMINOPHEN 325 MG PO TABS: 650.0000 mg | ORAL_TABLET | Freq: Four times a day (QID) | ORAL | Status: DC | PRN

## 2017-10-21 MED ORDER — METHYLPREDNISOLONE SODIUM SUCC 125 MG IJ SOLR
80.0000 mg | Freq: Once | INTRAMUSCULAR | Status: DC
  Filled 2017-10-21: qty 2

## 2017-10-21 MED ORDER — SODIUM CHLORIDE 0.9 % IV SOLN
INTRAVENOUS | Status: DC
  Administered 2017-10-21 – 2017-10-22 (×2): via INTRAVENOUS

## 2017-10-21 MED ORDER — ALBUTEROL SULFATE (2.5 MG/3ML) 0.083% IN NEBU
2.5000 mg | INHALATION_SOLUTION | Freq: Three times a day (TID) | RESPIRATORY_TRACT | Status: DC
  Administered 2017-10-21 – 2017-10-22 (×2): 2.5 mg via RESPIRATORY_TRACT
  Filled 2017-10-21 (×2): qty 3

## 2017-10-21 MED ORDER — SODIUM CHLORIDE 0.9 % IV BOLUS (SEPSIS)
1000.0000 mL | Freq: Once | INTRAVENOUS | Status: AC
  Administered 2017-10-21: 1000 mL via INTRAVENOUS

## 2017-10-21 MED ORDER — IOPAMIDOL (ISOVUE-370) INJECTION 76%
100.0000 mL | Freq: Once | INTRAVENOUS | Status: AC | PRN
  Administered 2017-10-21: 100 mL via INTRAVENOUS

## 2017-10-21 MED ORDER — ALBUTEROL SULFATE (2.5 MG/3ML) 0.083% IN NEBU: 2.5000 mg | INHALATION_SOLUTION | RESPIRATORY_TRACT | Status: DC | PRN

## 2017-10-21 MED ORDER — HYDROCODONE-ACETAMINOPHEN 5-325 MG PO TABS
1.0000 | ORAL_TABLET | Freq: Once | ORAL | Status: AC
  Administered 2017-10-21: 1 via ORAL
  Filled 2017-10-21: qty 1

## 2017-10-21 NOTE — Telephone Encounter (Signed)
I called and spoke with the pt husband Quita Skye. He states she is at the Ed [facility]) now. He says the pt was still exp cough congestion and chest pain from all of the coughing.I told him I would just hold off on asking Dr.Scott for Tamiflu as she is being reevaluated at the ed at the present moment to see what they tell her and dx her with and the medications they recommend.

## 2017-10-21 NOTE — ED Notes (Signed)
Report given to [place], Therapist, sports. Patient ready for transfer to the floor.

## 2017-10-21 NOTE — Telephone Encounter (Signed)
I am fine with giving the order but patient was just admitted into the hospital today

## 2017-10-21 NOTE — ED Triage Notes (Signed)
Pt recent admitted for influenza. Pt states after d/c she has felt worse than before. Pt c/o cough and generalized body aches.

## 2017-10-21 NOTE — ED Notes (Signed)
[place] for patient to eat and drink per Dr. Rogene Houston. Patient given fluids and crackers.

## 2017-10-21 NOTE — Telephone Encounter (Signed)
Called patient, left message to call back and schedule a hospital follow up with Dr. Nicki Reaper.

## 2017-10-21 NOTE — H&P (Addendum)
TRH H&P   Patient Demographics:    Kathleen [name], is a 57 y.o. female  MRN: [id number]   DOB - Feb 04, 1961  Admit Date - 10/21/2017  Outpatient Primary MD for the patient is [name], MD  Referring MD/NP/PA: Aundria Rud  Outpatient Specialists:      Patient coming from: home  Chief Complaint  Patient presents with  . Cough      HPI:    Kathleen [name]  is a 57 y.o. female, w Sarcoidosis, ? ILD (per pt), Adrenal insufficiency,  Fibromyalgia, Anemia apparently presents with c/o dyspnea.  Pt was diagnosed 5 days ago w influenza.  Pt had had increase in dyspnea, wheezing and dry cough. Pt denies fever, chills, cp, palp, n/v, heartburn, abd pain, diarrhea, brbpr, black stool.  Pt presented to ED due to dyspnea  In ED, apparently pox 80's  CTA chest IMPRESSION: 1.  Negative for acute pulmonary embolus.  Negative visible aorta. 2. No definite acute pulmonary finding. Chronic lung disease with bilateral upper lobe perihilar bronchiectasis, consolidation, and architectural distortion. 3. Stable mild mediastinal lymphadenopathy compatible with chronic Sarcoidosis.  Na 139, K 3.4 Bun 12, Creatinine 0.89 Alb 3.2 Ast 14, Alt 13 Wbc 9.6, Hgb 11.5, Plt 343  Pt will be admitted for dyspnea.     Review of systems:    In addition to the HPI above,  No Fever-chills, No Headache, No changes with Vision or hearing, No problems swallowing food or Liquids, No Chest pain, No Abdominal pain, No Nausea or Vommitting, Bowel movements are regular, No Blood in stool or Urine, No dysuria, No new skin rashes or bruises, No new joints pains-aches,  No new weakness, tingling, numbness in any extremity, No recent weight gain or loss, No polyuria, polydypsia or polyphagia, No significant Mental Stressors.  A full 10 point Review of Systems was done, except as stated above, all  other Review of Systems were negative.   With Past History of the following :    Past Medical History:  Diagnosis Date  . Adrenal insufficiency ([place])   . Anemia   . Avascular bone necrosis ([place])   . Breast fibrocystic disorder   . Chronic pain   . Fibromyalgia   . Gastroesophageal reflux disease   . Mitral valve prolapse   . Orthostatic hypotension   . Peripheral neuropathy   . Restrictive lung disease    Moderate to severe  . Sarcoidosis    Biopsy proven - [facility]  . SOB (shortness of breath)    chronic      Past Surgical History:  Procedure Laterality Date  . BREAST LUMPECTOMY  01/12/2011   right  . TUBAL LIGATION        Social History:     Social History   Tobacco Use  . Smoking status: Never Smoker  . Smokeless tobacco: Never Used  Substance Use Topics  .  Alcohol use: No     Lives - at home  Mobility - walks by self   Family History :     Family History  Problem Relation Age of Onset  . Cardiomyopathy Mother   . Breast cancer Mother   . Prostate cancer Father   . Neurofibromatosis Brother   . Heart disease Maternal Aunt   . Bipolar disorder Daughter   . Colon cancer Neg Hx       Home Medications:   Prior to Admission medications   Medication Sig Start Date End Date Taking? Authorizing Provider  acetaminophen (TYLENOL) 325 MG tablet Take 2 tablets (650 mg total) by mouth every 6 (six) hours as needed for mild pain (or Fever >/= 101). 08/21/16  Yes Corey Harold, NP  albuterol (PROVENTIL) (2.5 MG/3ML) 0.083% nebulizer solution Take 3 mLs (2.5 mg total) by nebulization every 2 (two) hours as needed for wheezing or shortness of breath. 02/07/17  Yes Luking, Elayne Snare, MD  benzonatate (TESSALON PERLES) 100 MG capsule Take 1 capsule (100 mg total) by mouth 3 (three) times daily as needed for cough. 10/18/17 10/18/18 Yes [name], MD  budesonide (PULMICORT) 0.5 MG/2ML nebulizer solution Take 2 mLs (0.5 mg total) by nebulization 2 (two) times daily.  10/18/17  Yes [name], MD  dextromethorphan-guaiFENesin (MUCINEX DM) 30-600 MG 12hr tablet Take 1 tablet by mouth 2 (two) times daily. 10/18/17  Yes [name], MD  ferrous sulfate 325 (65 FE) MG EC tablet Take 325 mg by mouth daily with breakfast.   Yes [provider]  levothyroxine (SYNTHROID, LEVOTHROID) 75 MCG tablet Take 1 tablet (75 mcg total) by mouth daily before breakfast. 09/02/17  Yes Nida, Marella Chimes, MD  midodrine (PROAMATINE) 10 MG tablet Take 1 tablet (10 mg total) by mouth 3 (three) times daily with meals. 10/18/17  Yes [name], MD  Multiple Vitamins-Minerals (EMERGEN-C IMMUNE PO) Take 1-2 each by mouth daily. GUMMIES   Yes [provider]  ondansetron (ZOFRAN ODT) 8 MG disintegrating tablet Take 1 tablet (8 mg total) by mouth every 8 (eight) hours as needed for nausea or vomiting. 10/15/17  Yes [name], MD  oseltamivir (TAMIFLU) 75 MG capsule Take 1 capsule (75 mg total) by mouth 2 (two) times daily. 10/15/17  Yes [name], MD  oxyCODONE-acetaminophen (PERCOCET) 7.5-325 MG tablet Take 1 tablet by mouth 3 (three) times daily as needed for severe pain. 10/03/17  Yes [name], MD  pantoprazole (PROTONIX) 40 MG tablet TAKE 1 TABLET BY MOUTH ONCE DAILY 02/20/17  Yes [name], MD  predniSONE (DELTASONE) 10 MG tablet Take 30mg  daily for 1 day then 20mg  daily for 1 day then 10mg  daily 10/18/17  Yes [name], MD  promethazine (PHENERGAN) 25 MG tablet Take 1 tablet (25 mg total) by mouth every 6 (six) hours as needed for nausea or vomiting. 05/26/17  Yes Sherwood Gambler, MD     Allergies:     Allergies  Allergen Reactions  . Bee Venom Anaphylaxis  . Other Other (See Comments)    Contrast Dye CT chest w/ contrast at OSH followed by SOB resolved w/ single dose steroids, never ENT swelling, intubation or pressors, has had multiple contrasted scans since  . Codeine Itching    Other reaction(s): ITCHING  . Morphine And  Related Other (See Comments)    Unknown reaction- pt gets sick, dizzy, and confused Intolerance, not an allergy, received this in the emergency department recently without a  problem.     Physical Exam:   Vitals  Blood pressure 122/66, pulse 67, temperature 98.6 F (37 C), resp. rate (!) 22, height 5\' 10"  (1.778 m), weight 102.5 kg (226 lb), SpO2 96 %.   1. General  lying in bed in NAD,   2. Normal affect and insight, Not Suicidal or Homicidal, Awake Alert, Oriented X 3.  3. No F.N deficits, ALL C.Nerves Intact, Strength 5/5 all 4 extremities, Sensation intact all 4 extremities, Plantars down going.  4. Ears and Eyes appear Normal, Conjunctivae clear, PERRLA. Moist Oral Mucosa.  5. Supple Neck, No JVD, No cervical lymphadenopathy appriciated, No Carotid Bruits.  6. Symmetrical Chest wall movement, Good air movement bilaterally, slight crackles right lung 1/4 up, and left lung base, slight wheezing  7. RRR, No Gallops, Rubs or Murmurs, No Parasternal Heave.  8. Positive Bowel Sounds, Abdomen Soft, No tenderness, No organomegaly appriciated,No rebound -guarding or rigidity.  9.  No Cyanosis, Normal Skin Turgor, No Skin Rash or Bruise.  10. Good muscle tone,  joints appear normal , no effusions, Normal ROM.  11. No Palpable Lymph Nodes in Neck or Axillae     Data Review:    CBC Recent Labs  Lab 10/15/17 1738 10/16/17 0513 10/17/17 0606 10/18/17 0411 10/21/17 1335  WBC 6.9 5.5 6.3 7.2 9.6  HGB 12.2 10.9* 10.3* 10.2* 11.5*  HCT 38.4 34.3* 31.9* 32.2* 35.8*  PLT 348 294 290 306 343  MCV 88.1 88.9 89.4 90.4 87.3  MCH 28.0 28.2 28.9 28.7 28.0  MCHC 31.8 31.8 32.3 31.7 32.1  RDW 14.8 14.7 15.0 15.1 14.3  LYMPHSABS 0.4* 0.4* 0.5* 0.7 0.9  MONOABS 0.9 0.1 0.6 0.4 0.9  EOSABS 0.1 0.0 0.0 0.0 0.1  BASOSABS 0.0 0.0 0.0 0.0 0.0   ------------------------------------------------------------------------------------------------------------------  Chemistries  Recent Labs    Lab 10/15/17 1738 10/16/17 0513 10/21/17 1335  NA 136 139 139  K 3.8 4.2 3.4*  CL 103 109 102  CO2 22 22 26   GLUCOSE 97 148* 91  BUN 12 11 12   CREATININE 1.03* 0.91 0.89  CALCIUM 9.1 8.6* 9.0  MG 2.0  --   --   AST  --  17 14*  ALT  --  15 13*  ALKPHOS  --  56 54  BILITOT  --  0.3 0.3   ------------------------------------------------------------------------------------------------------------------ estimated creatinine clearance is 92.6 mL/min (by C-G formula based on SCr of 0.89 mg/dL). ------------------------------------------------------------------------------------------------------------------ No results for input(s): TSH, T4TOTAL, T3FREE, THYROIDAB in the last 72 hours.  Invalid input(s): FREET3  Coagulation profile No results for input(s): INR, PROTIME in the last 168 hours. ------------------------------------------------------------------------------------------------------------------- No results for input(s): DDIMER in the last 72 hours. -------------------------------------------------------------------------------------------------------------------  Cardiac Enzymes Recent Labs  Lab 10/15/17 1738  TROPONINI <0.03   ------------------------------------------------------------------------------------------------------------------    Component Value Date/Time   BNP 382.4 (H) 08/19/2016 2138     ---------------------------------------------------------------------------------------------------------------  Urinalysis    Component Value Date/Time   COLORURINE COLORLESS (A) 10/16/2017 0100   APPEARANCEUR CLEAR 10/16/2017 0100   LABSPEC 1.004 (L) 10/16/2017 0100   PHURINE 5.0 10/16/2017 0100   GLUCOSEU NEGATIVE 10/16/2017 0100   GLUCOSEU NEGATIVE 05/29/2010 1622   HGBUR NEGATIVE 10/16/2017 0100   BILIRUBINUR NEGATIVE 10/16/2017 0100   BILIRUBINUR Positive 08/16/2015 0928   KETONESUR NEGATIVE 10/16/2017 0100   PROTEINUR NEGATIVE 10/16/2017 0100    UROBILINOGEN 0.2 06/16/2014 0942   NITRITE NEGATIVE 10/16/2017 0100   LEUKOCYTESUR NEGATIVE 10/16/2017 0100    ----------------------------------------------------------------------------------------------------------------   Imaging Results:  Dg Chest 2 View  Result Date: 10/21/2017 CLINICAL DATA:  Chest pain EXAM: CHEST  2 VIEW COMPARISON:  10/15/2017, 09/30/2017, 08/08/2017, 08/21/2016, CT chest 09/11/2012, 10/20/2015 FINDINGS: Coarse chronic interstitial opacity. No acute consolidation or effusion. Posterior upper consolidation similar compared to prior presumably corresponds to the consolidation and scarring noted on prior chest CT. Stable cardiomediastinal silhouette. No pneumothorax. IMPRESSION: No active cardiopulmonary disease. Similar appearance coarse chronic interstitial opacity and upper lobe posterior lung scarring and fibrosis. Electronically Signed   By: Donavan Foil M.D.   On: 10/21/2017 14:13   Ct Angio Chest Pe W/cm &/or Wo Cm  Result Date: 10/21/2017 CLINICAL DATA:  57 year old female with recent influenza diagnosis. Cough, dizziness, fatigue. Sarcoidosis. EXAM: CT ANGIOGRAPHY CHEST WITH CONTRAST TECHNIQUE: Multidetector CT imaging of the chest was performed using the standard protocol during bolus administration of intravenous contrast. Multiplanar CT image reconstructions and MIPs were obtained to evaluate the vascular anatomy. CONTRAST:  156mL ISOVUE-370 IOPAMIDOL (ISOVUE-370) INJECTION 76% COMPARISON:  CTA chest 09/11/2012. Chest radiographs today, and earlier. FINDINGS: Cardiovascular: Adequate contrast bolus timing in the pulmonary arterial tree. Chronic mild enlargement of the central pulmonary arteries. No focal filling defect identified in the pulmonary arteries to suggest acute pulmonary embolism. Possible preferential contrast enhancement of the upper lobe rather than lower lobe pulmonary arteries, possibly related to the chronic lung disease. Negative visible aorta.  Borderline to mild cardiomegaly appears stable since 2014. No pericardial effusion. Mediastinum/Nodes: Generally sub centimeter mediastinal lymph nodes. The largest nodes are in the right paratracheal station and are stable since 2014. No definite hilar lymphadenopathy. No acute mediastinal findings. No axillary lymphadenopathy. Lungs/Pleura: Larger lung volumes compared to 2014. The major airways remain patent. Chronic bilateral upper lobe perihilar bronchiectasis, consolidation, and architectural distortion has not significantly changed. Superimposed scattered subpleural pulmonary scarring with mild additional architectural distortion. No definite acute pulmonary opacity. No pleural effusion. Upper Abdomen: Negative visible liver, gallbladder, spleen, pancreas, adrenal glands, and bowel in the upper abdomen. Musculoskeletal: No acute osseous abnormality identified. Review of the MIP images confirms the above findings. IMPRESSION: 1.  Negative for acute pulmonary embolus.  Negative visible aorta. 2. No definite acute pulmonary finding. Chronic lung disease with bilateral upper lobe perihilar bronchiectasis, consolidation, and architectural distortion. 3. Stable mild mediastinal lymphadenopathy compatible with chronic Sarcoidosis. Electronically Signed   By: Genevie Ann M.D.   On: 10/21/2017 16:44       Assessment & Plan:    Principal Problem:   Dyspnea Active Problems:   Hypokalemia   Anemia    Dyspnea ? Reactive airway disease Prednisone 60mg  po qday zithromax 500mg  iv qday Cont pulmicort Albuterol neb tid and q2h prn Check cardiac echo  Influenza  Cont Tamiflu 75mg  po bid  Adrenal insufficiency ? Prednisone as above Cont midodrine  Hypothyroidism Cont levothyroxine  Hypokalemia Replete Check cmp in am  Anemia Check cbc in am    DVT Prophylaxis   Lovenox - SCDs  AM Labs Ordered, also please review Full Orders  Family Communication: Admission, patients condition and plan of  care including tests being ordered have been discussed with the patient  who indicate understanding and agree with the plan and Code Status.  Code Status FULL CODE  Likely DC to  home  Condition GUARDED    Consults called:  none  Admission status: observation  Time spent in minutes : 45   Jani Gravel M.D on 10/21/2017 at 6:49 PM  Between 7am to 7pm - Pager - [phone]   After  7pm go to [email or web address] - password [facility]  [facility] - Office  [phone]

## 2017-10-21 NOTE — Telephone Encounter (Signed)
I spoke with Kathleen [name] with [facility] she states she needed orders on this pt for general assessment with the below frequency . I informed her that the pt was seen this am in the hospital ed,no new news if admitted.She states she would like to get the orders for this ( for the general assesment) and if admitted these orders would be null and void.Please advise.

## 2017-10-21 NOTE — Telephone Encounter (Signed)
Requesting refill on Tamiflu.  She was diagnosed with the flu on 10/15/17 by Dr. Nicki Reaper.  Also, patients husband, Quita Skye, wanted [place] to know that he dropped her off at the hospital this morning because she is no better.  [facility]

## 2017-10-21 NOTE — Telephone Encounter (Signed)
Requesting order for start of care for 1 time for 1 week, 2 times for 1 week, 1 time for 2 weeks.  Also her discharge summary from the hospital said they recommended to have a CBC w/ diff and bMet rechecked in 1 week and they can go ahead and do that as well if Dr. Nicki Reaper would like.

## 2017-10-21 NOTE — ED Provider Notes (Signed)
[facility] EMERGENCY DEPARTMENT Provider Note   CSN: [id number] Arrival date & time: 10/21/17  0930     History   Chief Complaint Chief Complaint  Patient presents with  . Cough    HPI Kathleen [name] is a 57 y.o. female.  Patient admitted to the hospital on February 19 discharged on February 22 for complications of influenza A.  Patient was hypotensive and initially admitted to the ICU.  Patient is completed a course of Tamiflu.  He is using albuterol inhaler at home and Mucinex DM.  Patient not feeling any better still very short of breath still coughing a lot still very fatigued.  Still not eating and drinking well.  Patient has a past history significant for sarcoidosis.      Past Medical History:  Diagnosis Date  . Adrenal insufficiency ([place])   . Anemia   . Avascular bone necrosis ([place])   . Breast fibrocystic disorder   . Chronic pain   . Fibromyalgia   . Gastroesophageal reflux disease   . Mitral valve prolapse   . Orthostatic hypotension   . Peripheral neuropathy   . Restrictive lung disease    Moderate to severe  . Sarcoidosis    Biopsy proven - [facility]  . SOB (shortness of breath)    chronic    Patient Active Problem List   Diagnosis Date Noted  . Dyspnea 10/21/2017  . Influenza 10/16/2017  . Acute adrenal crisis ([place]) 10/15/2017  . Gastroesophageal reflux disease 10/15/2017  . Personal history of noncompliance with medical treatment, presenting hazards to health 04/26/2017  . Hypothyroidism 01/15/2017  . Septic shock ([place])   . Elevated troponin   . Symptomatic bradycardia   . Acute respiratory failure with hypoxia ([place])   . Adrenal insufficiency ([place]) 08/17/2016  . Hypokalemia 08/17/2016  . Chronic pain syndrome 04/20/2016  . Vaginal bleeding 01/02/2016  . Acute blood loss anemia 12/31/2015  . Gastroenteritis   . Hypovolemic shock ([place]) 12/30/2015  . Nausea and vomiting 12/29/2015  . Hypotension 12/29/2015  . Dehydration 12/28/2015  .  Constipation 11/14/2015  . Avascular necrosis of bone of left hip ([place]) 04/05/2015  . Bimalleolar fracture of right ankle 02/11/2015  . Post herpetic neuralgia 10/22/2014  . Hyperlipidemia 08/25/2014  . Obstructive sleep apnea 02/18/2014  . Obesity, unspecified 05/06/2013  . Fibromyalgia 02/11/2013  . Depression 02/11/2013  . Abdominal pain, other specified site 02/11/2013  . Shingles 02/02/2013  . Urinary retention 02/02/2013  . Pedal edema 01/22/2013  . Orthostatic hypotension 01/16/2013  . Disturbance of skin sensation 11/26/2012  . Pain in limb 11/26/2012  . Helicobacter pylori gastritis 05/23/2012  . Cough 05/02/2012  . Fibrocystic breast disease in female 04/09/2012  . DOE (dyspnea on exertion) 08/03/2011  . Anxiety state 10/19/2010  . CHEST PAIN 11/22/2008  . Sarcoidosis 06/21/2008    Past Surgical History:  Procedure Laterality Date  . BREAST LUMPECTOMY  01/12/2011   right  . TUBAL LIGATION      OB History    Gravida Para Term Preterm AB Living   1 1 1          SAB TAB Ectopic Multiple Live Births                   Home Medications    Prior to Admission medications   Medication Sig Start Date End Date Taking? Authorizing Provider  acetaminophen (TYLENOL) 325 MG tablet Take 2 tablets (650 mg total) by mouth every 6 (six) hours as needed  for mild pain (or Fever >/= 101). 08/21/16  Yes Corey Harold, NP  albuterol (PROVENTIL) (2.5 MG/3ML) 0.083% nebulizer solution Take 3 mLs (2.5 mg total) by nebulization every 2 (two) hours as needed for wheezing or shortness of breath. 02/07/17  Yes Luking, Elayne Snare, MD  benzonatate (TESSALON PERLES) 100 MG capsule Take 1 capsule (100 mg total) by mouth 3 (three) times daily as needed for cough. 10/18/17 10/18/18 Yes [name], MD  budesonide (PULMICORT) 0.5 MG/2ML nebulizer solution Take 2 mLs (0.5 mg total) by nebulization 2 (two) times daily. 10/18/17  Yes [name], MD  dextromethorphan-guaiFENesin (MUCINEX DM)  30-600 MG 12hr tablet Take 1 tablet by mouth 2 (two) times daily. 10/18/17  Yes [name], MD  ferrous sulfate 325 (65 FE) MG EC tablet Take 325 mg by mouth daily with breakfast.   Yes [provider]  levothyroxine (SYNTHROID, LEVOTHROID) 75 MCG tablet Take 1 tablet (75 mcg total) by mouth daily before breakfast. 09/02/17  Yes Nida, Marella Chimes, MD  midodrine (PROAMATINE) 10 MG tablet Take 1 tablet (10 mg total) by mouth 3 (three) times daily with meals. 10/18/17  Yes [name], MD  Multiple Vitamins-Minerals (EMERGEN-C IMMUNE PO) Take 1-2 each by mouth daily. GUMMIES   Yes [provider]  ondansetron (ZOFRAN ODT) 8 MG disintegrating tablet Take 1 tablet (8 mg total) by mouth every 8 (eight) hours as needed for nausea or vomiting. 10/15/17  Yes [name], MD  oseltamivir (TAMIFLU) 75 MG capsule Take 1 capsule (75 mg total) by mouth 2 (two) times daily. 10/15/17  Yes [name], MD  oxyCODONE-acetaminophen (PERCOCET) 7.5-325 MG tablet Take 1 tablet by mouth 3 (three) times daily as needed for severe pain. 10/03/17  Yes [name], MD  pantoprazole (PROTONIX) 40 MG tablet TAKE 1 TABLET BY MOUTH ONCE DAILY 02/20/17  Yes [name], MD  predniSONE (DELTASONE) 10 MG tablet Take 30mg  daily for 1 day then 20mg  daily for 1 day then 10mg  daily 10/18/17  Yes [name], MD  promethazine (PHENERGAN) 25 MG tablet Take 1 tablet (25 mg total) by mouth every 6 (six) hours as needed for nausea or vomiting. 05/26/17  Yes Sherwood Gambler, MD    Family History Family History  Problem Relation Age of Onset  . Cardiomyopathy Mother   . Breast cancer Mother   . Prostate cancer Father   . Neurofibromatosis Brother   . Heart disease Maternal Aunt   . Bipolar disorder Daughter   . Colon cancer Neg Hx     Social History Social History   Tobacco Use  . Smoking status: Never Smoker  . Smokeless tobacco: Never Used  Substance Use Topics  . Alcohol use: No  .  Drug use: No     Allergies   Bee venom; Other; Codeine; and Morphine and related   Review of Systems Review of Systems  Constitutional: Positive for fatigue.  HENT: Positive for congestion.   Eyes: Negative for redness.  Respiratory: Positive for cough and shortness of breath.   Cardiovascular: Negative for chest pain.  Gastrointestinal: Negative for abdominal pain, nausea and vomiting.  Genitourinary: Negative for dysuria.  Musculoskeletal: Positive for myalgias.  Skin: Negative for rash.  Neurological: Positive for headaches.  Hematological: Does not bruise/bleed easily.  Psychiatric/Behavioral: Negative for confusion.     Physical Exam Updated Vital Signs BP (!) 111/43   Pulse 83   Temp 98.6 F (37 C)   Resp (!) 22  Ht 1.778 m (5\' 10" )   Wt 102.5 kg (226 lb)   SpO2 97%   BMI 32.43 kg/m   Physical Exam  Constitutional: She is oriented to person, place, and time. She appears well-developed and well-nourished. She appears distressed.  HENT:  Head: Normocephalic and atraumatic.  Mucous membranes dry  Eyes: Conjunctivae and EOM are normal. Pupils are equal, round, and reactive to light.  Neck: Normal range of motion.  Cardiovascular: Normal rate, regular rhythm and normal heart sounds.  Pulmonary/Chest: She has wheezes.  Abdominal: Soft. Bowel sounds are normal. There is no tenderness.  Musculoskeletal: Normal range of motion. She exhibits no edema.  Neurological: She is alert and oriented to person, place, and time. No cranial nerve deficit or sensory deficit. She exhibits normal muscle tone. Coordination normal.  Skin: Skin is warm.  Nursing note and vitals reviewed.    ED Treatments / Results  Labs (all labs ordered are listed, but only abnormal results are displayed) Labs Reviewed  COMPREHENSIVE METABOLIC PANEL - Abnormal; Notable for the following components:      Result Value   Potassium 3.4 (*)    Albumin 3.2 (*)    AST 14 (*)    ALT 13 (*)     All other components within normal limits  CBC WITH DIFFERENTIAL/PLATELET - Abnormal; Notable for the following components:   Hemoglobin 11.5 (*)    HCT 35.8 (*)    All other components within normal limits    EKG  EKG Interpretation None       Radiology Dg Chest 2 View  Result Date: 10/21/2017 CLINICAL DATA:  Chest pain EXAM: CHEST  2 VIEW COMPARISON:  10/15/2017, 09/30/2017, 08/08/2017, 08/21/2016, CT chest 09/11/2012, 10/20/2015 FINDINGS: Coarse chronic interstitial opacity. No acute consolidation or effusion. Posterior upper consolidation similar compared to prior presumably corresponds to the consolidation and scarring noted on prior chest CT. Stable cardiomediastinal silhouette. No pneumothorax. IMPRESSION: No active cardiopulmonary disease. Similar appearance coarse chronic interstitial opacity and upper lobe posterior lung scarring and fibrosis. Electronically Signed   By: Donavan Foil M.D.   On: 10/21/2017 14:13   Ct Angio Chest Pe W/cm &/or Wo Cm  Result Date: 10/21/2017 CLINICAL DATA:  57 year old female with recent influenza diagnosis. Cough, dizziness, fatigue. Sarcoidosis. EXAM: CT ANGIOGRAPHY CHEST WITH CONTRAST TECHNIQUE: Multidetector CT imaging of the chest was performed using the standard protocol during bolus administration of intravenous contrast. Multiplanar CT image reconstructions and MIPs were obtained to evaluate the vascular anatomy. CONTRAST:  179mL ISOVUE-370 IOPAMIDOL (ISOVUE-370) INJECTION 76% COMPARISON:  CTA chest 09/11/2012. Chest radiographs today, and earlier. FINDINGS: Cardiovascular: Adequate contrast bolus timing in the pulmonary arterial tree. Chronic mild enlargement of the central pulmonary arteries. No focal filling defect identified in the pulmonary arteries to suggest acute pulmonary embolism. Possible preferential contrast enhancement of the upper lobe rather than lower lobe pulmonary arteries, possibly related to the chronic lung disease. Negative  visible aorta. Borderline to mild cardiomegaly appears stable since 2014. No pericardial effusion. Mediastinum/Nodes: Generally sub centimeter mediastinal lymph nodes. The largest nodes are in the right paratracheal station and are stable since 2014. No definite hilar lymphadenopathy. No acute mediastinal findings. No axillary lymphadenopathy. Lungs/Pleura: Larger lung volumes compared to 2014. The major airways remain patent. Chronic bilateral upper lobe perihilar bronchiectasis, consolidation, and architectural distortion has not significantly changed. Superimposed scattered subpleural pulmonary scarring with mild additional architectural distortion. No definite acute pulmonary opacity. No pleural effusion. Upper Abdomen: Negative visible liver, gallbladder, spleen, pancreas, adrenal glands,  and bowel in the upper abdomen. Musculoskeletal: No acute osseous abnormality identified. Review of the MIP images confirms the above findings. IMPRESSION: 1.  Negative for acute pulmonary embolus.  Negative visible aorta. 2. No definite acute pulmonary finding. Chronic lung disease with bilateral upper lobe perihilar bronchiectasis, consolidation, and architectural distortion. 3. Stable mild mediastinal lymphadenopathy compatible with chronic Sarcoidosis. Electronically Signed   By: Genevie Ann M.D.   On: 10/21/2017 16:44    Procedures Procedures (including critical care time)  Medications Ordered in ED Medications  0.9 %  sodium chloride infusion ( Intravenous New Bag/Given 10/21/17 1340)  ipratropium-albuterol (DUONEB) 0.5-2.5 (3) MG/3ML nebulizer solution 3 mL (3 mLs Nebulization Given 10/21/17 1108)  sodium chloride 0.9 % bolus 1,000 mL (0 mLs Intravenous Stopped 10/21/17 1530)  ipratropium-albuterol (DUONEB) 0.5-2.5 (3) MG/3ML nebulizer solution 3 mL (3 mLs Nebulization Given 10/21/17 1346)  acetaminophen (TYLENOL) tablet 650 mg (650 mg Oral Given 10/21/17 1340)  iopamidol (ISOVUE-370) 76 % injection 100 mL (100 mLs  Intravenous Contrast Given 10/21/17 1621)  methylPREDNISolone sodium succinate (SOLU-MEDROL) 125 mg/2 mL injection 125 mg (125 mg Intravenous Given 10/21/17 1719)     Initial Impression / Assessment and Plan / ED Course  I have reviewed the triage vital signs and the nursing notes.  Pertinent labs & imaging results that were available during my care of the patient were reviewed by me and considered in my medical decision making (see chart for details).     Patient with admission February 19 for complications of influenza.  Patient was hypotensive at the time and was originally admitted to the ICU waiting 10.  Patient was discharged home on Friday the 22nd.  Patient has completed Tamiflu.  Using albuterol inhaler and is on a steroid taper.  Patient still feeling very fatigued still having a lot of shortness of breath coughing a lot.  Not feeling any better.  Observed here on pulse ox and when patient falls asleep she will desat down to around 87%.  Patient does not normally have oxygen at home.  This was following an albuterol Atrovent nebulized fluids.  Chest x-ray negative for any obvious pneumonia.  Since she was in the ICU CT angios chest was done to rule out pulmonary embolus it was negative.  Feel that patient has a significant viral bronchitis with a lot of lung irritation.  May need more steroids so given Solu-Medrol 125 mg here.  Consult to hospitalist for admission patient placed on 2 L nasal cannula oxygen.  Patient did receive 1 L of IV fluids.  Patient clinically also looked dehydrated.  Patient's labs without significant abnormalities.  Addition patient has a history of sarcoidosis.  Final Clinical Impressions(s) / ED Diagnoses   Final diagnoses:  Influenza A  Cough  SOB (shortness of breath)  Dehydration  Hypoxia    ED Discharge Orders    None       Fredia Sorrow, MD 10/21/17 1732

## 2017-10-21 NOTE — ED Notes (Signed)
Patient able to ambulate to bathroom. Cardiac monitor placed.

## 2017-10-22 ENCOUNTER — Telehealth: Payer: Self-pay | Admitting: Family Medicine

## 2017-10-22 ENCOUNTER — Observation Stay (HOSPITAL_COMMUNITY): Payer: 59

## 2017-10-22 DIAGNOSIS — J101 Influenza due to other identified influenza virus with other respiratory manifestations: Secondary | ICD-10-CM | POA: Diagnosis not present

## 2017-10-22 DIAGNOSIS — Z79890 Hormone replacement therapy (postmenopausal): Secondary | ICD-10-CM | POA: Diagnosis not present

## 2017-10-22 DIAGNOSIS — E876 Hypokalemia: Secondary | ICD-10-CM | POA: Diagnosis not present

## 2017-10-22 DIAGNOSIS — K219 Gastro-esophageal reflux disease without esophagitis: Secondary | ICD-10-CM | POA: Diagnosis present

## 2017-10-22 DIAGNOSIS — G629 Polyneuropathy, unspecified: Secondary | ICD-10-CM | POA: Diagnosis present

## 2017-10-22 DIAGNOSIS — Z888 Allergy status to other drugs, medicaments and biological substances status: Secondary | ICD-10-CM | POA: Diagnosis not present

## 2017-10-22 DIAGNOSIS — M797 Fibromyalgia: Secondary | ICD-10-CM | POA: Diagnosis present

## 2017-10-22 DIAGNOSIS — Z885 Allergy status to narcotic agent status: Secondary | ICD-10-CM | POA: Diagnosis not present

## 2017-10-22 DIAGNOSIS — R0600 Dyspnea, unspecified: Secondary | ICD-10-CM | POA: Diagnosis not present

## 2017-10-22 DIAGNOSIS — E2740 Unspecified adrenocortical insufficiency: Secondary | ICD-10-CM | POA: Diagnosis present

## 2017-10-22 DIAGNOSIS — Z79899 Other long term (current) drug therapy: Secondary | ICD-10-CM | POA: Diagnosis not present

## 2017-10-22 DIAGNOSIS — Z91030 Bee allergy status: Secondary | ICD-10-CM | POA: Diagnosis not present

## 2017-10-22 DIAGNOSIS — R05 Cough: Secondary | ICD-10-CM | POA: Diagnosis not present

## 2017-10-22 DIAGNOSIS — Z9119 Patient's noncompliance with other medical treatment and regimen: Secondary | ICD-10-CM | POA: Diagnosis not present

## 2017-10-22 DIAGNOSIS — G4733 Obstructive sleep apnea (adult) (pediatric): Secondary | ICD-10-CM | POA: Diagnosis present

## 2017-10-22 DIAGNOSIS — G894 Chronic pain syndrome: Secondary | ICD-10-CM | POA: Diagnosis present

## 2017-10-22 DIAGNOSIS — R0902 Hypoxemia: Secondary | ICD-10-CM | POA: Diagnosis present

## 2017-10-22 DIAGNOSIS — Z7951 Long term (current) use of inhaled steroids: Secondary | ICD-10-CM | POA: Diagnosis not present

## 2017-10-22 DIAGNOSIS — Z8249 Family history of ischemic heart disease and other diseases of the circulatory system: Secondary | ICD-10-CM | POA: Diagnosis not present

## 2017-10-22 DIAGNOSIS — E039 Hypothyroidism, unspecified: Secondary | ICD-10-CM | POA: Diagnosis present

## 2017-10-22 DIAGNOSIS — D860 Sarcoidosis of lung: Secondary | ICD-10-CM | POA: Diagnosis present

## 2017-10-22 DIAGNOSIS — J09X2 Influenza due to identified novel influenza A virus with other respiratory manifestations: Secondary | ICD-10-CM | POA: Diagnosis present

## 2017-10-22 DIAGNOSIS — E860 Dehydration: Secondary | ICD-10-CM | POA: Diagnosis present

## 2017-10-22 DIAGNOSIS — D649 Anemia, unspecified: Secondary | ICD-10-CM | POA: Diagnosis present

## 2017-10-22 DIAGNOSIS — I341 Nonrheumatic mitral (valve) prolapse: Secondary | ICD-10-CM | POA: Diagnosis present

## 2017-10-22 LAB — CBC
HCT: 34.7 % — ABNORMAL LOW (ref 36.0–46.0)
Hemoglobin: 11 g/dL — ABNORMAL LOW (ref 12.0–15.0)
MCH: 27.9 pg (ref 26.0–34.0)
MCHC: 31.7 g/dL (ref 30.0–36.0)
MCV: 88.1 fL (ref 78.0–100.0)
Platelets: 355 10*3/uL (ref 150–400)
RBC: 3.94 MIL/uL (ref 3.87–5.11)
RDW: 14.2 % (ref 11.5–15.5)
WBC: 6.3 10*3/uL (ref 4.0–10.5)

## 2017-10-22 LAB — COMPREHENSIVE METABOLIC PANEL WITH GFR
ALT: 12 U/L — ABNORMAL LOW (ref 14–54)
AST: 13 U/L — ABNORMAL LOW (ref 15–41)
Albumin: 2.9 g/dL — ABNORMAL LOW (ref 3.5–5.0)
Alkaline Phosphatase: 50 U/L (ref 38–126)
Anion gap: 9 (ref 5–15)
BUN: 13 mg/dL (ref 6–20)
CO2: 25 mmol/L (ref 22–32)
Calcium: 8.9 mg/dL (ref 8.9–10.3)
Chloride: 105 mmol/L (ref 101–111)
Creatinine, Ser: 0.71 mg/dL (ref 0.44–1.00)
GFR calc Af Amer: >60 mL/min
GFR calc non Af Amer: >60 mL/min
Glucose, Bld: 127 mg/dL — ABNORMAL HIGH (ref 65–99)
Potassium: 4.3 mmol/L (ref 3.5–5.1)
Sodium: 139 mmol/L (ref 135–145)
Total Bilirubin: 0.2 mg/dL — ABNORMAL LOW (ref 0.3–1.2)
Total Protein: 6.1 g/dL — ABNORMAL LOW (ref 6.5–8.1)

## 2017-10-22 LAB — TSH: TSH: 0.242 u[IU]/mL — ABNORMAL LOW (ref 0.350–4.500)

## 2017-10-22 MED ORDER — PREDNISONE 10 MG PO TABS: 10.0000 mg | ORAL_TABLET | Freq: Every day | ORAL | 2 refills | Status: DC

## 2017-10-22 MED ORDER — PREDNISONE 10 MG PO TABS: 10.0000 mg | ORAL_TABLET | Freq: Every day | ORAL | 0 refills | Status: DC

## 2017-10-22 NOTE — Telephone Encounter (Signed)
Nurse's-patient recently discharged from the hospital. Please call patient, let them know that we are aware that they were discharged from the hospital. Please schedule them to follow-up with us within the next 14 days. Advised the patient to bring all of their medications with him to the visit. Please inquire if they are having any acute issues currently and documented accordingly.  

## 2017-10-22 NOTE — Discharge Summary (Signed)
Physician Discharge Summary  MADLINE OESTERLING OQH:[id number] DOB: 04/06/1961 DOA: 10/21/2017  PCP: [name], MD  Admit date: 10/21/2017 Discharge date: 10/22/2017  Time spent: 45 minutes  Recommendations for Outpatient Follow-up:  -Will be discharged home today. -Advised to follow up with PCP in 2 weeks.   Discharge Diagnoses:  Principal Problem:   Dyspnea Active Problems:   Hypokalemia   Influenza A   Anemia   Discharge Condition: Stable and improved  Filed Weights   10/21/17 0935 10/22/17 0600  Weight: 102.5 kg (226 lb) 102 kg (224 lb 13.9 oz)    History of present illness:  As per Dr. Maudie Mercury on 2/25: Noemie Devivo  is a 57 y.o. female, w Sarcoidosis, ? ILD (per pt), Adrenal insufficiency,  Fibromyalgia, Anemia apparently presents with c/o dyspnea.  Pt was diagnosed 5 days ago w influenza.  Pt had had increase in dyspnea, wheezing and dry cough. Pt denies fever, chills, cp, palp, n/v, heartburn, abd pain, diarrhea, brbpr, black stool.  Pt presented to ED due to dyspnea  In ED, apparently pox 80's  CTA chest IMPRESSION: 1. Negative for acute pulmonary embolus. Negative visible aorta. 2. No definite acute pulmonary finding. Chronic lung disease with bilateral upper lobe perihilar bronchiectasis, consolidation, and architectural distortion. 3. Stable mild mediastinal lymphadenopathy compatible with chronic Sarcoidosis.  Na 139, K 3.4 Bun 12, Creatinine 0.89 Alb 3.2 Ast 14, Alt 13 Wbc 9.6, Hgb 11.5, Plt 343  Pt will be admitted for dyspnea.      Hospital Course:   Subjective fever, myalgias, transient hypoxemia -Was recently diagnosed with influenza A and has completed treatment with Tamiflu. -Chest x-ray without acute infiltrates. -Patient has a history of sarcoidosis and has chronic lung infiltrates and consolidations that are present again on CT scan but nothing is acute. -Do not believe she needs treatment with antibiotics and will discontinue at  this time. -She no longer has oxygen requirements. -Because of her history of adrenal insufficiency and sarcoidosis will be discharged on stress dose steroids and a longer than normal prednisone taper.  Procedures:  None   Consultations:  None  Discharge Instructions  Discharge Instructions    Diet - low sodium heart healthy   Complete by:  As directed    Increase activity slowly   Complete by:  As directed    Place in observation (patient's expected length of stay will be less than 2 midnights)   Complete by:  As directed      Allergies as of 10/22/2017      Reactions   Bee Venom Anaphylaxis   Other Other (See Comments)   Contrast Dye CT chest w/ contrast at OSH followed by SOB resolved w/ single dose steroids, never ENT swelling, intubation or pressors, has had multiple contrasted scans since   Codeine Itching   Other reaction(s): ITCHING   Morphine And Related Other (See Comments)   Unknown reaction- pt gets sick, dizzy, and confused Intolerance, not an allergy, received this in the emergency department recently without a problem.      Medication List    STOP taking these medications   oseltamivir 75 MG capsule Commonly known as:  TAMIFLU     TAKE these medications   acetaminophen 325 MG tablet Commonly known as:  TYLENOL Take 2 tablets (650 mg total) by mouth every 6 (six) hours as needed for mild pain (or Fever >/= 101).   albuterol (2.5 MG/3ML) 0.083% nebulizer solution Commonly known as:  PROVENTIL Take 3  mLs (2.5 mg total) by nebulization every 2 (two) hours as needed for wheezing or shortness of breath.   benzonatate 100 MG capsule Commonly known as:  TESSALON PERLES Take 1 capsule (100 mg total) by mouth 3 (three) times daily as needed for cough.   budesonide 0.5 MG/2ML nebulizer solution Commonly known as:  PULMICORT Take 2 mLs (0.5 mg total) by nebulization 2 (two) times daily.   dextromethorphan-guaiFENesin 30-600 MG 12hr tablet Commonly known  as:  MUCINEX DM Take 1 tablet by mouth 2 (two) times daily.   EMERGEN-C IMMUNE PO Take 1-2 each by mouth daily. GUMMIES   ferrous sulfate 325 (65 FE) MG EC tablet Take 325 mg by mouth daily with breakfast.   levothyroxine 75 MCG tablet Commonly known as:  SYNTHROID, LEVOTHROID Take 1 tablet (75 mcg total) by mouth daily before breakfast.   midodrine 10 MG tablet Commonly known as:  PROAMATINE Take 1 tablet (10 mg total) by mouth 3 (three) times daily with meals.   ondansetron 8 MG disintegrating tablet Commonly known as:  ZOFRAN ODT Take 1 tablet (8 mg total) by mouth every 8 (eight) hours as needed for nausea or vomiting.   oxyCODONE-acetaminophen 7.5-325 MG tablet Commonly known as:  PERCOCET Take 1 tablet by mouth 3 (three) times daily as needed for severe pain.   pantoprazole 40 MG tablet Commonly known as:  PROTONIX TAKE 1 TABLET BY MOUTH ONCE DAILY   predniSONE 10 MG tablet Commonly known as:  DELTASONE Take 1 tablet (10 mg total) by mouth daily with breakfast. What changed:    how much to take  how to take this  when to take this  additional instructions   predniSONE 10 MG tablet Commonly known as:  DELTASONE Take 1 tablet (10 mg total) by mouth daily with breakfast. Take 6 tabs for 3 days, 5 tabs for 3 days, 4 tabs for 3 days, 3 tabs for 3 days, 2 tabs for 3 days What changed:  You were already taking a medication with the same name, and this prescription was added. Make sure you understand how and when to take each.   promethazine 25 MG tablet Commonly known as:  PHENERGAN Take 1 tablet (25 mg total) by mouth every 6 (six) hours as needed for nausea or vomiting.      Allergies  Allergen Reactions  . Bee Venom Anaphylaxis  . Other Other (See Comments)    Contrast Dye CT chest w/ contrast at OSH followed by SOB resolved w/ single dose steroids, never ENT swelling, intubation or pressors, has had multiple contrasted scans since  . Codeine Itching     Other reaction(s): ITCHING  . Morphine And Related Other (See Comments)    Unknown reaction- pt gets sick, dizzy, and confused Intolerance, not an allergy, received this in the emergency department recently without a problem.   Follow-up Information    Luking, Elayne Snare, MD. Schedule an appointment as soon as possible for a visit in 2 week(s).   Specialty:  Family Medicine Contact information: [address] [place] [address] [phone]            The results of significant diagnostics from this hospitalization (including imaging, microbiology, ancillary and laboratory) are listed below for reference.    Significant Diagnostic Studies: Dg Chest 2 View  Result Date: 10/21/2017 CLINICAL DATA:  Chest pain EXAM: CHEST  2 VIEW COMPARISON:  10/15/2017, 09/30/2017, 08/08/2017, 08/21/2016, CT chest 09/11/2012, 10/20/2015 FINDINGS: Coarse chronic interstitial opacity. No  acute consolidation or effusion. Posterior upper consolidation similar compared to prior presumably corresponds to the consolidation and scarring noted on prior chest CT. Stable cardiomediastinal silhouette. No pneumothorax. IMPRESSION: No active cardiopulmonary disease. Similar appearance coarse chronic interstitial opacity and upper lobe posterior lung scarring and fibrosis. Electronically Signed   By: Donavan Foil M.D.   On: 10/21/2017 14:13   Dg Chest 2 View  Result Date: 10/15/2017 CLINICAL DATA:  Cough and congestion EXAM: CHEST  2 VIEW COMPARISON:  September 30, 2017 and October 20, 2015 FINDINGS: There is scarring in the left upper lobe posteriorly. A lesser degree of scarring is noted in the left base and right upper lobe. There is no edema or consolidation. The heart size and pulmonary vascularity are normal. No adenopathy. There is aortic atherosclerosis. No evident bone lesions. IMPRESSION: Areas of scarring, most notably in the posterior segment of the left upper lobe. No edema or consolidation. Heart  size normal. There is aortic atherosclerosis. Aortic Atherosclerosis (ICD10-I70.0). Electronically Signed   By: Lowella Grip III M.D.   On: 10/15/2017 17:35   Dg Chest 2 View  Result Date: 09/30/2017 CLINICAL DATA:  Shortness of breath and chest pain. History of sarcoidosis EXAM: CHEST  2 VIEW COMPARISON:  August 08, 2017 and October 20, 2015 FINDINGS: There is chronic opacification in the posterior aspect of the left upper lobe better seen on lateral view. Milder scarring is noted in the right upper lobe. There is no edema or consolidation. Heart size and pulmonary vascularity are normal. No adenopathy. No bone lesions. IMPRESSION: Chronic scarring, most notable in the posterior segment of the left upper lobe, likely residua from known chronic sarcoidosis. No new opacity. Heart size normal. No adenopathy appreciable. Electronically Signed   By: Lowella Grip III M.D.   On: 09/30/2017 20:58   Ct Angio Chest Pe W/cm &/or Wo Cm  Result Date: 10/21/2017 CLINICAL DATA:  57 year old female with recent influenza diagnosis. Cough, dizziness, fatigue. Sarcoidosis. EXAM: CT ANGIOGRAPHY CHEST WITH CONTRAST TECHNIQUE: Multidetector CT imaging of the chest was performed using the standard protocol during bolus administration of intravenous contrast. Multiplanar CT image reconstructions and MIPs were obtained to evaluate the vascular anatomy. CONTRAST:  163mL ISOVUE-370 IOPAMIDOL (ISOVUE-370) INJECTION 76% COMPARISON:  CTA chest 09/11/2012. Chest radiographs today, and earlier. FINDINGS: Cardiovascular: Adequate contrast bolus timing in the pulmonary arterial tree. Chronic mild enlargement of the central pulmonary arteries. No focal filling defect identified in the pulmonary arteries to suggest acute pulmonary embolism. Possible preferential contrast enhancement of the upper lobe rather than lower lobe pulmonary arteries, possibly related to the chronic lung disease. Negative visible aorta. Borderline to mild  cardiomegaly appears stable since 2014. No pericardial effusion. Mediastinum/Nodes: Generally sub centimeter mediastinal lymph nodes. The largest nodes are in the right paratracheal station and are stable since 2014. No definite hilar lymphadenopathy. No acute mediastinal findings. No axillary lymphadenopathy. Lungs/Pleura: Larger lung volumes compared to 2014. The major airways remain patent. Chronic bilateral upper lobe perihilar bronchiectasis, consolidation, and architectural distortion has not significantly changed. Superimposed scattered subpleural pulmonary scarring with mild additional architectural distortion. No definite acute pulmonary opacity. No pleural effusion. Upper Abdomen: Negative visible liver, gallbladder, spleen, pancreas, adrenal glands, and bowel in the upper abdomen. Musculoskeletal: No acute osseous abnormality identified. Review of the MIP images confirms the above findings. IMPRESSION: 1.  Negative for acute pulmonary embolus.  Negative visible aorta. 2. No definite acute pulmonary finding. Chronic lung disease with bilateral upper lobe perihilar bronchiectasis, consolidation, and architectural  distortion. 3. Stable mild mediastinal lymphadenopathy compatible with chronic Sarcoidosis. Electronically Signed   By: Genevie Ann M.D.   On: 10/21/2017 16:44    Microbiology: Recent Results (from the past 240 hour(s))  Rapid strep screen     Status: None   Collection Time: 10/15/17  8:54 PM  Result Value Ref Range Status   Streptococcus, Group A Screen (Direct) NEGATIVE NEGATIVE Final    Comment: (NOTE) A Rapid Antigen test may result negative if the antigen level in the sample is below the detection level of this test. The FDA has not cleared this test as a stand-alone test therefore the rapid antigen negative result has reflexed to a Group A Strep culture. Performed at [facility], [address] [place], [place] [address]   Culture, group A strep     Status: None   Collection  Time: 10/15/17  8:54 PM  Result Value Ref Range Status   Specimen Description   Final    THROAT Performed at [facility], [address], [place], [place] [address]    Special Requests   Final    NONE Reflexed from [id number] Performed at [facility], [address] [place], [place] [address]    Culture   Final    NO GROUP A STREP (S.PYOGENES) ISOLATED Performed at [facility], [place] [address], [place], [place] [address]    Report Status 10/18/2017 FINAL  Final  Respiratory Panel by PCR     Status: Abnormal   Collection Time: 10/15/17 10:08 PM  Result Value Ref Range Status   Adenovirus NOT DETECTED NOT DETECTED Final   Coronavirus 229E NOT DETECTED NOT DETECTED Final   Coronavirus HKU1 DETECTED (A) NOT DETECTED Final   Coronavirus NL63 NOT DETECTED NOT DETECTED Final   Coronavirus OC43 DETECTED (A) NOT DETECTED Final   Metapneumovirus NOT DETECTED NOT DETECTED Final   Rhinovirus / Enterovirus NOT DETECTED NOT DETECTED Final   Influenza A H3 DETECTED (A) NOT DETECTED Final   Influenza B NOT DETECTED NOT DETECTED Final   Parainfluenza Virus 1 NOT DETECTED NOT DETECTED Final   Parainfluenza Virus 2 NOT DETECTED NOT DETECTED Final   Parainfluenza Virus 3 NOT DETECTED NOT DETECTED Final   Parainfluenza Virus 4 NOT DETECTED NOT DETECTED Final   Respiratory Syncytial Virus NOT DETECTED NOT DETECTED Final   Bordetella pertussis NOT DETECTED NOT DETECTED Final   Chlamydophila pneumoniae NOT DETECTED NOT DETECTED Final   Mycoplasma pneumoniae NOT DETECTED NOT DETECTED Final    Comment: Performed at [facility], [place] [address], [place], [place] [address]  MRSA PCR Screening     Status: None   Collection Time: 10/16/17 11:22 PM  Result Value Ref Range Status   MRSA by PCR NEGATIVE NEGATIVE Final    Comment:        The GeneXpert MRSA Assay (FDA approved for NASAL specimens only), is one component of a comprehensive MRSA colonization surveillance program. It is not intended  to diagnose MRSA infection nor to guide or monitor treatment for MRSA infections. Performed at [facility], [address] [place], [place] [address]      Labs: Basic Metabolic Panel: Recent Labs  Lab 10/15/17 1738 10/16/17 0513 10/21/17 1335 10/22/17 0636  NA 136 139 139 139  K 3.8 4.2 3.4* 4.3  CL 103 109 102 105  CO2 22 22 26 25   GLUCOSE 97 148* 91 127*  BUN 12 11 12 13   CREATININE 1.03* 0.91 0.89 0.71  CALCIUM  9.1 8.6* 9.0 8.9  MG 2.0  --   --   --   PHOS 3.4  --   --   --    Liver Function Tests: Recent Labs  Lab 10/16/17 0513 10/21/17 1335 10/22/17 0636  AST 17 14* 13*  ALT 15 13* 12*  ALKPHOS 56 54 50  BILITOT 0.3 0.3 0.2*  PROT 6.3* 6.5 6.1*  ALBUMIN 3.3* 3.2* 2.9*   No results for input(s): LIPASE, AMYLASE in the last 168 hours. No results for input(s): AMMONIA in the last 168 hours. CBC: Recent Labs  Lab 10/15/17 1738 10/16/17 0513 10/17/17 0606 10/18/17 0411 10/21/17 1335 10/22/17 0636  WBC 6.9 5.5 6.3 7.2 9.6 6.3  NEUTROABS 5.5 5.0 5.2 6.2 7.7  --   HGB 12.2 10.9* 10.3* 10.2* 11.5* 11.0*  HCT 38.4 34.3* 31.9* 32.2* 35.8* 34.7*  MCV 88.1 88.9 89.4 90.4 87.3 88.1  PLT 348 294 290 306 343 355   Cardiac Enzymes: Recent Labs  Lab 10/15/17 1738  TROPONINI <0.03   BNP: BNP (last 3 results) No results for input(s): BNP in the last 8760 hours.  ProBNP (last 3 results) No results for input(s): PROBNP in the last 8760 hours.  CBG: No results for input(s): GLUCAP in the last 168 hours.     Signed:  Lelon Frohlich  [facility] Pager: [phone] 10/22/2017, 2:17 PM

## 2017-10-22 NOTE — Care Management Note (Signed)
Case Management Note  Patient Details  Name: Kathleen [name] MRN: [id number] Date of Birth: Jan 10, 1961  Subjective/Objective:             Admitted with flu. Pt from home with husband, recently [place] with same dx. Pt active with [facility].        Action/Plan: DC home today with resumption of HH serivces. Juliann Pulse, [facility] rep, aware of  DC and will pull resumption order from chart.   Expected Discharge Date:  10/22/17               Expected Discharge Plan:  [place]  In-House Referral:  NA  Discharge planning Services  CM Consult  Post Acute Care Choice:  Home Health, Resumption of Svcs/PTA Provider Choice offered to:  Patient  HH Arranged:  RN, PT [facility] Agency:  [place]  Status of Service:  Completed, signed off  Sherald Barge, RN 10/22/2017, 2:07 PM

## 2017-10-22 NOTE — Telephone Encounter (Signed)
Kathleen [name] is aware she states she will send orders for [place] to sign.

## 2017-10-22 NOTE — Progress Notes (Signed)
Discharge instructions reviewed with patient by Radene Gunning, LPN. Given copy of AVS. Stated patient verbalized understanding of instructions. Pt left floor in stable condition via w/c accompanied by nursing staff. Donavan Foil, RN

## 2017-10-23 ENCOUNTER — Encounter: Payer: Self-pay | Admitting: *Deleted

## 2017-10-23 ENCOUNTER — Telehealth: Payer: Self-pay | Admitting: Family Medicine

## 2017-10-23 ENCOUNTER — Other Ambulatory Visit: Payer: Self-pay | Admitting: *Deleted

## 2017-10-23 DIAGNOSIS — J111 Influenza due to unidentified influenza virus with other respiratory manifestations: Secondary | ICD-10-CM | POA: Diagnosis not present

## 2017-10-23 DIAGNOSIS — G629 Polyneuropathy, unspecified: Secondary | ICD-10-CM | POA: Diagnosis not present

## 2017-10-23 DIAGNOSIS — J984 Other disorders of lung: Secondary | ICD-10-CM | POA: Diagnosis not present

## 2017-10-23 DIAGNOSIS — E272 Addisonian crisis: Secondary | ICD-10-CM | POA: Diagnosis not present

## 2017-10-23 DIAGNOSIS — D869 Sarcoidosis, unspecified: Secondary | ICD-10-CM | POA: Diagnosis not present

## 2017-10-23 DIAGNOSIS — K219 Gastro-esophageal reflux disease without esophagitis: Secondary | ICD-10-CM | POA: Diagnosis not present

## 2017-10-23 DIAGNOSIS — E785 Hyperlipidemia, unspecified: Secondary | ICD-10-CM | POA: Diagnosis not present

## 2017-10-23 DIAGNOSIS — I951 Orthostatic hypotension: Secondary | ICD-10-CM | POA: Diagnosis not present

## 2017-10-23 DIAGNOSIS — G8929 Other chronic pain: Secondary | ICD-10-CM | POA: Diagnosis not present

## 2017-10-23 MED FILL — LEVOTHYROXINE 75 MCG TABLET: 75 | 30 days supply | Qty: 30 | Fill #0

## 2017-10-23 MED FILL — OXYCODONE/APAP 7.5/325MG: 7.5-325 | 30 days supply | Qty: 90 | Fill #0

## 2017-10-23 NOTE — Patient Outreach (Addendum)
[facility]) Care Management  10/23/2017  Kathleen [name] 1960-10-14 [id number]   Subjective: Telephone call to patient's home / mobile number, spoke with patient, and HIPAA verified.  Discussed [facility] Care Management [facility]MR Transition of care follow up, patient voiced understanding, and is in agreement to follow up.    Patient states she is doing ok, currently resting, primary MD's office has called to check on her since she has been home from the hospital, and has a follow up appointment with primary MD on 11/05/17.   Patient states she is able to manage self care and has assistance as needed with activities of daily living / home management.   Patient voices understanding of medical diagnosis and treatment plan.  States she is accessing the following [facility] benefits: outpatient pharmacy, hospital indemnity (not sure if chosen, will ask husband who is the [employer] employee, to verify benefit, will file claim if appropriate), and no family medical leave act (FMLA) needed at this time.  Patient states she does not have any education material, transition of care, care coordination, disease management, disease monitoring, transportation, community resource, or pharmacy needs at this time.  States she is very appreciative of the follow up and is in agreement to receive [place] Management information.       Objective:  Per KPN (Knowledge Performance Now, point of care tool) and chart review, patient hospitalized 10/21/17 -10/22/17 for Dyspnea, hypokalemia, Influenza A, and anemia.   Patient hospitalized 10/15/17  -10/18/17 for Acute adrenal crisis.   Patient also has a history of sarcoidosis, Hyperlipidemia, avascular bone necrosis, breast fibrocystic disorder, chronic pain, fibromyalgia, and adrenal insufficiency.         Assessment: Received UMR Transition of care referral on 10/17/17.  Transition of care follow up completed, no care management needs, and will proceed with case closure.       Plan:RNCM will send patient successful outreach letter, [facility] pamphlet, and magnet. RNCM will send case closure due to follow up completed / no care management needs request to [facility].       Brooklynne Pereida H. Annia Friendly, BSN, [place] Management [facility] Telephonic CM Phone: [phone] Fax: [phone]

## 2017-10-23 NOTE — Telephone Encounter (Signed)
Patient states she was dxd with the flu. She states she still has residual cough,She aware to call us if needed,and transferred up front to sched hospital follow up appt.

## 2017-10-23 NOTE — Telephone Encounter (Signed)
Please advise 

## 2017-10-23 NOTE — Telephone Encounter (Signed)
Please go ahead with trying to help set this up.  The overnight oxygen study potentially could be done by advance home care or by [facility] respiratory services

## 2017-10-23 NOTE — Telephone Encounter (Signed)
Calling to get verbal order for still nursing to resume care.  Kathleen [name] was just discharged from the hospital.  Also, would like overnight oxygen study done.

## 2017-10-24 MED FILL — predniSONE 10 MG TABS: 10 | 18 days supply | Qty: 60 | Fill #0

## 2017-10-24 NOTE — Telephone Encounter (Signed)
Please address, If you need anything just let me know.Thanks

## 2017-10-24 NOTE — Telephone Encounter (Signed)
Kathleen [name] with [facility]  Gave verbal OK to resume skilled nursing  Please fax Rx for overnight pulse oximetry Fx# [phone]

## 2017-10-28 ENCOUNTER — Encounter: Payer: Self-pay | Admitting: Family Medicine

## 2017-10-28 ENCOUNTER — Ambulatory Visit (INDEPENDENT_AMBULATORY_CARE_PROVIDER_SITE_OTHER): Payer: 59 | Admitting: Family Medicine

## 2017-10-28 VITALS — BP 104/70 | Ht 70.0 in | Wt 224.0 lb

## 2017-10-28 DIAGNOSIS — D649 Anemia, unspecified: Secondary | ICD-10-CM

## 2017-10-28 DIAGNOSIS — E2740 Unspecified adrenocortical insufficiency: Secondary | ICD-10-CM

## 2017-10-28 DIAGNOSIS — Z79899 Other long term (current) drug therapy: Secondary | ICD-10-CM

## 2017-10-28 DIAGNOSIS — G4730 Sleep apnea, unspecified: Secondary | ICD-10-CM

## 2017-10-28 DIAGNOSIS — E039 Hypothyroidism, unspecified: Secondary | ICD-10-CM

## 2017-10-28 MED ORDER — BUSPIRONE HCL 5 MG PO TABS: ORAL_TABLET | ORAL | 1 refills | Status: DC

## 2017-10-28 MED ORDER — LEVOTHYROXINE SODIUM 75 MCG PO TABS: ORAL_TABLET | ORAL | 5 refills | Status: DC

## 2017-10-28 NOTE — Progress Notes (Signed)
   Subjective:    Patient ID: Kathleen [name], female    DOB: 1961/01/08, 57 y.o.   MRN: [id number]  HPI Patient is here today for a hospital follow up from 10/21/2017. She had the flu.She feels some what better,but still fight a non productive cough and congestion.No fevers, no other complaints.  ER notes, admission notes, labs reviewed in detail. Review of Systems  Constitutional: Negative for activity change and fever.  HENT: Positive for congestion and rhinorrhea. Negative for ear pain.   Eyes: Negative for discharge.  Respiratory: Positive for cough. Negative for shortness of breath and wheezing.   Cardiovascular: Negative for chest pain.       Objective:   Physical Exam  Constitutional: She appears well-developed.  HENT:  Head: Normocephalic.  Right Ear: External ear normal.  Left Ear: External ear normal.  Nose: Nose normal.  Mouth/Throat: Oropharynx is clear and moist. No oropharyngeal exudate.  Eyes: Right eye exhibits no discharge. Left eye exhibits no discharge.  Neck: Neck supple. No tracheal deviation present.  Cardiovascular: Normal rate and normal heart sounds.  No murmur heard. Pulmonary/Chest: Effort normal and breath sounds normal. She has no wheezes. She has no rales.  Lymphadenopathy:    She has no cervical adenopathy.  Skin: Skin is warm and dry.  Nursing note and vitals reviewed.         Assessment & Plan:  Lab work ordered.  Patient encouraged to stay on her prednisone, also on the medication helps her blood pressure, patient instructed to do a follow-up in several months Overnight oxygenation study recommended because of low oxygen's when in the hospital

## 2017-10-29 DIAGNOSIS — G629 Polyneuropathy, unspecified: Secondary | ICD-10-CM | POA: Diagnosis not present

## 2017-10-29 DIAGNOSIS — E785 Hyperlipidemia, unspecified: Secondary | ICD-10-CM | POA: Diagnosis not present

## 2017-10-29 DIAGNOSIS — K219 Gastro-esophageal reflux disease without esophagitis: Secondary | ICD-10-CM | POA: Diagnosis not present

## 2017-10-29 DIAGNOSIS — J984 Other disorders of lung: Secondary | ICD-10-CM | POA: Diagnosis not present

## 2017-10-29 DIAGNOSIS — G8929 Other chronic pain: Secondary | ICD-10-CM | POA: Diagnosis not present

## 2017-10-29 DIAGNOSIS — E272 Addisonian crisis: Secondary | ICD-10-CM | POA: Diagnosis not present

## 2017-10-29 DIAGNOSIS — D869 Sarcoidosis, unspecified: Secondary | ICD-10-CM | POA: Diagnosis not present

## 2017-10-29 DIAGNOSIS — J111 Influenza due to unidentified influenza virus with other respiratory manifestations: Secondary | ICD-10-CM | POA: Diagnosis not present

## 2017-10-29 DIAGNOSIS — I951 Orthostatic hypotension: Secondary | ICD-10-CM | POA: Diagnosis not present

## 2017-10-29 MED FILL — busPIRone HCL 5 MG TABS: 5 | 10 days supply | Qty: 30 | Fill #0

## 2017-10-29 MED FILL — BUDESONIDE 0.5 MG/2ML SUSP: 0.5 | 30 days supply | Qty: 120 | Fill #0

## 2017-10-31 ENCOUNTER — Telehealth: Payer: Self-pay | Admitting: Family Medicine

## 2017-10-31 NOTE — Telephone Encounter (Signed)
Need diagnosis for order of overnight pulse oximetry   Please add to order & sign so I may fax to [facility]  (in red folder in yellow box)

## 2017-10-31 NOTE — Telephone Encounter (Signed)
This was filled out thank you 

## 2017-11-01 ENCOUNTER — Telehealth: Payer: Self-pay

## 2017-11-01 NOTE — Telephone Encounter (Signed)
[id number] with [facility] called states pt missed an appt with them.[phone]

## 2017-11-04 ENCOUNTER — Observation Stay (HOSPITAL_COMMUNITY)
Admission: EM | Admit: 2017-11-04 | Discharge: 2017-11-06 | Disposition: A | Discharge status: Home / Self Care | Payer: 59 | Attending: Internal Medicine | Admitting: Internal Medicine

## 2017-11-04 ENCOUNTER — Encounter (HOSPITAL_COMMUNITY): Payer: Self-pay | Admitting: Emergency Medicine

## 2017-11-04 ENCOUNTER — Other Ambulatory Visit: Payer: Self-pay

## 2017-11-04 ENCOUNTER — Emergency Department (HOSPITAL_COMMUNITY): Payer: 59

## 2017-11-04 DIAGNOSIS — R1084 Generalized abdominal pain: Secondary | ICD-10-CM

## 2017-11-04 DIAGNOSIS — R109 Unspecified abdominal pain: Secondary | ICD-10-CM | POA: Diagnosis present

## 2017-11-04 DIAGNOSIS — I959 Hypotension, unspecified: Secondary | ICD-10-CM | POA: Diagnosis present

## 2017-11-04 DIAGNOSIS — E271 Primary adrenocortical insufficiency: Secondary | ICD-10-CM | POA: Diagnosis not present

## 2017-11-04 DIAGNOSIS — R197 Diarrhea, unspecified: Secondary | ICD-10-CM | POA: Diagnosis not present

## 2017-11-04 DIAGNOSIS — E039 Hypothyroidism, unspecified: Secondary | ICD-10-CM | POA: Diagnosis not present

## 2017-11-04 DIAGNOSIS — E2740 Unspecified adrenocortical insufficiency: Secondary | ICD-10-CM | POA: Diagnosis not present

## 2017-11-04 DIAGNOSIS — K529 Noninfective gastroenteritis and colitis, unspecified: Principal | ICD-10-CM | POA: Diagnosis present

## 2017-11-04 DIAGNOSIS — R112 Nausea with vomiting, unspecified: Secondary | ICD-10-CM | POA: Diagnosis not present

## 2017-11-04 DIAGNOSIS — D869 Sarcoidosis, unspecified: Secondary | ICD-10-CM | POA: Diagnosis not present

## 2017-11-04 LAB — CBC
HCT: 42.6 % (ref 36.0–46.0)
Hemoglobin: 13.2 g/dL (ref 12.0–15.0)
MCH: 28.1 pg (ref 26.0–34.0)
MCHC: 31 g/dL (ref 30.0–36.0)
MCV: 90.6 fL (ref 78.0–100.0)
Platelets: 344 10*3/uL (ref 150–400)
RBC: 4.7 MIL/uL (ref 3.87–5.11)
RDW: 15.2 % (ref 11.5–15.5)
WBC: 10.2 10*3/uL (ref 4.0–10.5)

## 2017-11-04 LAB — COMPREHENSIVE METABOLIC PANEL
ALT: 22 U/L (ref 14–54)
AST: 20 U/L (ref 15–41)
Albumin: 3.7 g/dL (ref 3.5–5.0)
Alkaline Phosphatase: 59 U/L (ref 38–126)
Anion gap: 10 (ref 5–15)
BUN: 13 mg/dL (ref 6–20)
CO2: 27 mmol/L (ref 22–32)
Calcium: 9.1 mg/dL (ref 8.9–10.3)
Chloride: 103 mmol/L (ref 101–111)
Creatinine, Ser: 0.84 mg/dL (ref 0.44–1.00)
GFR calc Af Amer: >60 mL/min (ref >60)
GFR calc non Af Amer: >60 mL/min (ref >60)
Glucose, Bld: 91 mg/dL (ref 65–99)
Potassium: 3.9 mmol/L (ref 3.5–5.1)
Sodium: 140 mmol/L (ref 135–145)
Total Bilirubin: 0.9 mg/dL (ref 0.3–1.2)
Total Protein: 6.8 g/dL (ref 6.5–8.1)

## 2017-11-04 LAB — URINALYSIS, ROUTINE W REFLEX MICROSCOPIC
Bilirubin Urine: NEGATIVE
Glucose, UA: NEGATIVE mg/dL
Hgb urine dipstick: NEGATIVE
Ketones, ur: NEGATIVE mg/dL
Leukocytes, UA: NEGATIVE
Nitrite: NEGATIVE
Protein, ur: NEGATIVE mg/dL
Specific Gravity, Urine: 1.008 (ref 1.005–1.030)
pH: 5 (ref 5.0–8.0)

## 2017-11-04 LAB — LIPASE, BLOOD: Lipase: 28 U/L (ref 11–51)

## 2017-11-04 LAB — TROPONIN I: Troponin I: <0.03 ng/mL (ref <0.03)

## 2017-11-04 MED ORDER — ACETAMINOPHEN 325 MG PO TABS
ORAL_TABLET | ORAL | Status: AC
  Filled 2017-11-04: qty 2

## 2017-11-04 MED ORDER — ONDANSETRON HCL 4 MG/2ML IJ SOLN
4.0000 mg | Freq: Once | INTRAMUSCULAR | Status: AC
  Administered 2017-11-04: 4 mg via INTRAVENOUS
  Filled 2017-11-04: qty 2

## 2017-11-04 MED ORDER — ENOXAPARIN SODIUM 60 MG/0.6ML ~~LOC~~ SOLN
50.0000 mg | SUBCUTANEOUS | Status: DC
  Administered 2017-11-04 – 2017-11-05 (×2): 50 mg via SUBCUTANEOUS
  Filled 2017-11-04 (×2): qty 0.6

## 2017-11-04 MED ORDER — SODIUM CHLORIDE 0.9 % IV SOLN
INTRAVENOUS | Status: DC
  Administered 2017-11-04 – 2017-11-06 (×3): via INTRAVENOUS

## 2017-11-04 MED ORDER — BENZONATATE 100 MG PO CAPS: 100.0000 mg | ORAL_CAPSULE | Freq: Three times a day (TID) | ORAL | Status: DC | PRN

## 2017-11-04 MED ORDER — PANTOPRAZOLE SODIUM 40 MG PO TBEC
40.0000 mg | DELAYED_RELEASE_TABLET | Freq: Every day | ORAL | Status: DC
  Administered 2017-11-05 – 2017-11-06 (×2): 40 mg via ORAL
  Filled 2017-11-04 (×2): qty 1

## 2017-11-04 MED ORDER — ONDANSETRON HCL 4 MG PO TABS: 4.0000 mg | ORAL_TABLET | Freq: Four times a day (QID) | ORAL | Status: DC | PRN

## 2017-11-04 MED ORDER — OXYCODONE-ACETAMINOPHEN 7.5-325 MG PO TABS
1.0000 | ORAL_TABLET | Freq: Three times a day (TID) | ORAL | Status: DC | PRN
  Administered 2017-11-06: 1 via ORAL
  Filled 2017-11-04 (×2): qty 1

## 2017-11-04 MED ORDER — SODIUM CHLORIDE 0.9 % IV BOLUS (SEPSIS)
1000.0000 mL | Freq: Once | INTRAVENOUS | Status: AC
  Administered 2017-11-04: 1000 mL via INTRAVENOUS

## 2017-11-04 MED ORDER — HYDROCORTISONE NA SUCCINATE PF 100 MG IJ SOLR
100.0000 mg | Freq: Once | INTRAMUSCULAR | Status: AC
  Administered 2017-11-04: 100 mg via INTRAVENOUS
  Filled 2017-11-04: qty 2

## 2017-11-04 MED ORDER — BUSPIRONE HCL 5 MG PO TABS
5.0000 mg | ORAL_TABLET | Freq: Three times a day (TID) | ORAL | Status: DC | PRN
  Administered 2017-11-05: 5 mg via ORAL
  Filled 2017-11-04: qty 1

## 2017-11-04 MED ORDER — ONDANSETRON HCL 4 MG/2ML IJ SOLN
4.0000 mg | Freq: Four times a day (QID) | INTRAMUSCULAR | Status: DC | PRN
  Administered 2017-11-05 – 2017-11-06 (×3): 4 mg via INTRAVENOUS
  Filled 2017-11-04 (×3): qty 2

## 2017-11-04 MED ORDER — ALBUTEROL SULFATE (2.5 MG/3ML) 0.083% IN NEBU: 2.5000 mg | INHALATION_SOLUTION | Freq: Four times a day (QID) | RESPIRATORY_TRACT | Status: DC | PRN

## 2017-11-04 MED ORDER — MIDODRINE HCL 5 MG PO TABS
10.0000 mg | ORAL_TABLET | Freq: Three times a day (TID) | ORAL | Status: DC
  Administered 2017-11-05 – 2017-11-06 (×4): 10 mg via ORAL
  Filled 2017-11-04 (×3): qty 2

## 2017-11-04 MED ORDER — ACETAMINOPHEN 325 MG PO TABS
650.0000 mg | ORAL_TABLET | Freq: Once | ORAL | Status: AC
  Administered 2017-11-04: 650 mg via ORAL

## 2017-11-04 MED ORDER — HYDROCORTISONE NA SUCCINATE PF 100 MG IJ SOLR
50.0000 mg | Freq: Three times a day (TID) | INTRAMUSCULAR | Status: DC
  Administered 2017-11-04 – 2017-11-06 (×5): 50 mg via INTRAVENOUS
  Filled 2017-11-04 (×5): qty 2

## 2017-11-04 MED ORDER — LEVOTHYROXINE SODIUM 75 MCG PO TABS
75.0000 ug | ORAL_TABLET | Freq: Every day | ORAL | Status: DC
  Administered 2017-11-05 – 2017-11-06 (×2): 75 ug via ORAL
  Filled 2017-11-04: qty 1
  Filled 2017-11-04: qty 3

## 2017-11-04 NOTE — ED Provider Notes (Signed)
[facility] EMERGENCY DEPARTMENT Provider Note   CSN: [id number] Arrival date & time: 11/04/17  1024     History   Chief Complaint Chief Complaint  Patient presents with  . Abdominal Pain    HPI Kathleen [name] is a 57 y.o. female.  She has a history of sarcoidosis and adrenal insufficiency.  This is her third visit in about a month for feeling ill.  She had 2 admissions for flu weakness and possible adrenal insufficiency.  She does not feel like she ever fully Back to normal yet.  She is complaining of continued nausea and vomiting and diarrhea and unable to hold on her meds.  There is been no fever.  There is been some cough but not much.  She is diffuse abdominal pain that she attributes to her vomiting.  There is been no blood in her vomit or diarrhea.  She denies recent antibiotics.  She feels generally weak.  She is also had intermittent chest pain for the last few days.  The history is provided by the patient.  Abdominal Pain   This is a new problem. The current episode started more than 2 days ago. The problem occurs constantly. The problem has not changed since onset.The pain is associated with an unknown factor. The pain is located in the generalized abdominal region. The quality of the pain is aching. The pain is moderate. Associated symptoms include diarrhea, nausea and vomiting. Pertinent negatives include fever, melena, constipation, dysuria, frequency, hematuria and arthralgias. Nothing aggravates the symptoms. Nothing relieves the symptoms.    Past Medical History:  Diagnosis Date  . Adrenal insufficiency ([place])   . Anemia   . Avascular bone necrosis ([place])   . Breast fibrocystic disorder   . Chronic pain   . Fibromyalgia   . Gastroesophageal reflux disease   . Mitral valve prolapse   . Orthostatic hypotension   . Peripheral neuropathy   . Restrictive lung disease    Moderate to severe  . Sarcoidosis    Biopsy proven - [facility]  . SOB (shortness of breath)    chronic      Patient Active Problem List   Diagnosis Date Noted  . Dyspnea 10/21/2017  . Anemia 10/21/2017  . Influenza A 10/16/2017  . Acute adrenal crisis ([place]) 10/15/2017  . Gastroesophageal reflux disease 10/15/2017  . Personal history of noncompliance with medical treatment, presenting hazards to health 04/26/2017  . Hypothyroidism 01/15/2017  . Septic shock ([place])   . Elevated troponin   . Symptomatic bradycardia   . Acute respiratory failure with hypoxia ([place])   . Adrenal insufficiency ([place]) 08/17/2016  . Hypokalemia 08/17/2016  . Chronic pain syndrome 04/20/2016  . Vaginal bleeding 01/02/2016  . Acute blood loss anemia 12/31/2015  . Gastroenteritis   . Hypovolemic shock ([place]) 12/30/2015  . Nausea and vomiting 12/29/2015  . Hypotension 12/29/2015  . Dehydration 12/28/2015  . Constipation 11/14/2015  . Avascular necrosis of bone of left hip ([place]) 04/05/2015  . Bimalleolar fracture of right ankle 02/11/2015  . Post herpetic neuralgia 10/22/2014  . Hyperlipidemia 08/25/2014  . Obstructive sleep apnea 02/18/2014  . Obesity, unspecified 05/06/2013  . Fibromyalgia 02/11/2013  . Depression 02/11/2013  . Abdominal pain, other specified site 02/11/2013  . Shingles 02/02/2013  . Urinary retention 02/02/2013  . Pedal edema 01/22/2013  . Orthostatic hypotension 01/16/2013  . Disturbance of skin sensation 11/26/2012  . Pain in limb 11/26/2012  . Helicobacter pylori gastritis 05/23/2012  . Cough 05/02/2012  . Fibrocystic  breast disease in female 04/09/2012  . DOE (dyspnea on exertion) 08/03/2011  . Anxiety state 10/19/2010  . CHEST PAIN 11/22/2008  . Sarcoidosis 06/21/2008    Past Surgical History:  Procedure Laterality Date  . BREAST LUMPECTOMY  01/12/2011   right  . TUBAL LIGATION      OB History    Gravida Para Term Preterm AB Living   1 1 1          SAB TAB Ectopic Multiple Live Births                   Home Medications    Prior to Admission medications    Medication Sig Start Date End Date Taking? Authorizing Provider  acetaminophen (TYLENOL) 325 MG tablet Take 2 tablets (650 mg total) by mouth every 6 (six) hours as needed for mild pain (or Fever >/= 101). 08/21/16   Corey Harold, NP  albuterol (PROVENTIL) (2.5 MG/3ML) 0.083% nebulizer solution Take 3 mLs (2.5 mg total) by nebulization every 2 (two) hours as needed for wheezing or shortness of breath. 02/07/17   [name], MD  benzonatate (TESSALON PERLES) 100 MG capsule Take 1 capsule (100 mg total) by mouth 3 (three) times daily as needed for cough. 10/18/17 10/18/18  [name], MD  budesonide (PULMICORT) 0.5 MG/2ML nebulizer solution Take 2 mLs (0.5 mg total) by nebulization 2 (two) times daily. 10/18/17   [name], MD  busPIRone (BUSPAR) 5 MG tablet One po TID Prn for anxiety 10/28/17   [name], MD  dextromethorphan-guaiFENesin [facility] DM) 30-600 MG 12hr tablet Take 1 tablet by mouth 2 (two) times daily. 10/18/17   [name], MD  ferrous sulfate 325 (65 FE) MG EC tablet Take 325 mg by mouth daily with breakfast.    [provider]  levothyroxine (SYNTHROID, LEVOTHROID) 75 MCG tablet Take half tablet on Monday then one tablet all other days 10/28/17   [name], MD  midodrine (PROAMATINE) 10 MG tablet Take 1 tablet (10 mg total) by mouth 3 (three) times daily with meals. 10/18/17   [name], MD  Multiple Vitamins-Minerals (EMERGEN-C IMMUNE PO) Take 1-2 each by mouth daily. [place]    [provider]  ondansetron (ZOFRAN ODT) 8 MG disintegrating tablet Take 1 tablet (8 mg total) by mouth every 8 (eight) hours as needed for nausea or vomiting. 10/15/17   [name], MD  oxyCODONE-acetaminophen (PERCOCET) 7.5-325 MG tablet Take 1 tablet by mouth 3 (three) times daily as needed for severe pain. 10/03/17   [name], MD  pantoprazole (PROTONIX) 40 MG tablet TAKE 1 TABLET BY MOUTH ONCE DAILY 02/20/17   [name], MD  predniSONE  (DELTASONE) 10 MG tablet Take 1 tablet (10 mg total) by mouth daily with breakfast. 10/22/17   Isaac Bliss, Rayford Halsted, MD  predniSONE (DELTASONE) 10 MG tablet Take 1 tablet (10 mg total) by mouth daily with breakfast. Take 6 tabs for 3 days, 5 tabs for 3 days, 4 tabs for 3 days, 3 tabs for 3 days, 2 tabs for 3 days 10/22/17   Isaac Bliss, Rayford Halsted, MD  promethazine (PHENERGAN) 25 MG tablet Take 1 tablet (25 mg total) by mouth every 6 (six) hours as needed for nausea or vomiting. 05/26/17   Sherwood Gambler, MD    Family History Family History  Problem Relation Age of Onset  . Cardiomyopathy Mother   . Breast cancer Mother   . Prostate cancer Father   .  Neurofibromatosis Brother   . Heart disease Maternal Aunt   . Bipolar disorder Daughter   . Colon cancer Neg Hx     Social History Social History   Tobacco Use  . Smoking status: Never Smoker  . Smokeless tobacco: Never Used  Substance Use Topics  . Alcohol use: No  . Drug use: No     Allergies   Bee venom; Other; Codeine; and Morphine and related   Review of Systems Review of Systems  Constitutional: Positive for fatigue. Negative for chills and fever.  HENT: Negative for ear pain and sore throat.   Eyes: Negative for pain and visual disturbance.  Respiratory: Negative for cough and shortness of breath.   Cardiovascular: Positive for chest pain. Negative for palpitations.  Gastrointestinal: Positive for abdominal pain, diarrhea, nausea and vomiting. Negative for constipation and melena.  Genitourinary: Negative for dysuria, frequency and hematuria.  Musculoskeletal: Negative for arthralgias and back pain.  Skin: Negative for color change and rash.  Neurological: Negative for seizures and syncope.  All other systems reviewed and are negative.    Physical Exam Updated Vital Signs BP 101/74 (BP Location: Right Arm)   Pulse 80   Temp (!) 97.5 F (36.4 C) (Oral)   Resp 18   Ht 5\' 10"  (1.778 m)   Wt 99.8 kg (220  lb)   SpO2 98%   BMI 31.57 kg/m   Physical Exam  Constitutional: She appears well-developed and well-nourished. No distress.  HENT:  Head: Normocephalic and atraumatic.  Eyes: Conjunctivae are normal.  Neck: Neck supple.  Cardiovascular: Normal rate and regular rhythm.  No murmur heard. Pulmonary/Chest: Effort normal and breath sounds normal. No respiratory distress.  Abdominal: Soft. Bowel sounds are normal. She exhibits no mass. There is generalized tenderness (mild). There is no rigidity and no guarding.  Musculoskeletal: She exhibits no edema, tenderness or deformity.  Neurological: She is alert.  Skin: Skin is warm and dry. Capillary refill takes less than 2 seconds.  Psychiatric: She has a normal mood and affect.  Nursing note and vitals reviewed.    ED Treatments / Results  Labs (all labs ordered are listed, but only abnormal results are displayed) Labs Reviewed  COMPREHENSIVE METABOLIC PANEL - Abnormal; Notable for the following components:      Result Value   Calcium 8.3 (*)    Total Protein 6.0 (*)    Albumin 3.2 (*)    All other components within normal limits  C DIFFICILE QUICK SCREEN W PCR REFLEX  LIPASE, BLOOD  COMPREHENSIVE METABOLIC PANEL  CBC  URINALYSIS, ROUTINE W REFLEX MICROSCOPIC  TROPONIN I  CBC    EKG  EKG Interpretation  Date/Time:  Monday November 04 2017 13:45:18 EDT Ventricular Rate:  77 PR Interval:    QRS Duration: 86 QT Interval:  372 QTC Calculation: 421 R Axis:   51 Text Interpretation:  Sinus rhythm Low voltage, precordial leads Abnormal R-wave progression, early transition Nonspecific T abnormalities, diffuse leads similar to prior 2/19 Confirmed by Aletta Edouard [phone]) on 11/04/2017 2:14:59 PM       Radiology Dg Chest 2 View  Result Date: 11/04/2017 CLINICAL DATA:  Nausea and vomiting EXAM: CHEST - 2 VIEW COMPARISON:  10/21/2017 FINDINGS: Cardiac shadow is stable. The lungs are well aerated bilaterally. No acute  infiltrate is seen. Chronic scarring is again noted similar to that seen on the prior CT examination in the posterior aspect of the upper lobes bilaterally. No effusion is seen. No bony abnormality is noted.  IMPRESSION: Chronic scarring.  No acute abnormality noted. Electronically Signed   By: Inez Catalina M.D.   On: 11/04/2017 14:46    Procedures Procedures (including critical care time)  Medications Ordered in ED Medications  hydrocortisone sodium succinate (SOLU-CORTEF) 100 MG injection 100 mg (not administered)  sodium chloride 0.9 % bolus 1,000 mL (not administered)  ondansetron (ZOFRAN) injection 4 mg (not administered)     Initial Impression / Assessment and Plan / ED Course  I have reviewed the triage vital signs and the nursing notes.  Pertinent labs & imaging results that were available during my care of the patient were reviewed by me and considered in my medical decision making (see chart for details).  Clinical Course as of Nov 05 2121  Mon Nov 04, 2017  1624 Evaluated patient.  Her blood pressure is now up to the 1 teens.  She still is very concerned about getting sent home despite her normal workup.  We will try another fluid bolus but I am not sure that I can reassure her.  [MB]  1934 He is still very upset and concerned about the fact that she might worsen on going home.  She is eaten here but because she has recurred.  I have talked to the hospitalist and explained the situation and they will observe and see if they can get her feeling better.  [MB]    Clinical Course User Index [MB] Hayden Rasmussen, MD     Final Clinical Impressions(s) / ED Diagnoses   Final diagnoses:  Generalized abdominal pain  Nausea vomiting and diarrhea  Adrenal insufficiency (Addison's disease) [facility])    ED Discharge Orders    None       Hayden Rasmussen, MD 11/05/17 2124

## 2017-11-04 NOTE — ED Triage Notes (Signed)
PT c/o generalized abdominal discomfort with n/v/d x3 days with generalized weakness. PT states recent hospitalization post flu last week.

## 2017-11-04 NOTE — H&P (Signed)
TRH H&P    Patient Demographics:    Kathleen [name], is a 57 y.o. female  MRN: [id number]  DOB - 10/09/1960  Admit Date - 11/04/2017  Referring MD/NP/PA: Dr. Melina Copa  Outpatient Primary MD for the patient is Kathleen Drown, MD  Patient coming from: Home  Chief complaint- Abdominal pain    HPI:    Kathleen [name]  is a 57 y.o. female, with history of adrenal insufficiency on chronic prednisone therapy, fibromyalgia, sarcoidosis who was recently discharged from the hospital on February 26 after she was treated for influenza A.  Patient says that for past 2 days she has been having nausea, vomiting and diarrhea which has been persistent.  Has been having difficulty keeping food down.  Complains of abdominal pain.  Pain has improved since she came to hospital. Patient was able to eat food in the ED without having vomiting. She denies chest pain, no shortness of breath. Denies fever or chills. No dysuria, urgency or frequency of urination. Patient takes chronic prednisone for adrenal insufficiency. No history of stroke or seizures. Denies passing out. In the ED patient was found to be hypotensive With SBP in 80s, patient received 100 mg of Solu-Cortef which improved the blood pressure.    Review of systems:      All other systems reviewed and are negative.   With Past History of the following :    Past Medical History:  Diagnosis Date  . Adrenal insufficiency ([place])   . Anemia   . Avascular bone necrosis ([place])   . Breast fibrocystic disorder   . Chronic pain   . Fibromyalgia   . Gastroesophageal reflux disease   . Mitral valve prolapse   . Orthostatic hypotension   . Peripheral neuropathy   . Restrictive lung disease    Moderate to severe  . Sarcoidosis    Biopsy proven - [facility]  . SOB (shortness of breath)    chronic      Past Surgical History:  Procedure Laterality Date  . BREAST LUMPECTOMY   01/12/2011   right  . TUBAL LIGATION        Social History:      Social History   Tobacco Use  . Smoking status: Never Smoker  . Smokeless tobacco: Never Used  Substance Use Topics  . Alcohol use: No       Family History :     Family History  Problem Relation Age of Onset  . Cardiomyopathy Mother   . Breast cancer Mother   . Prostate cancer Father   . Neurofibromatosis Brother   . Heart disease Maternal Aunt   . Bipolar disorder Daughter   . Colon cancer Neg Hx       Home Medications:   Prior to Admission medications   Medication Sig Start Date End Date Taking? Authorizing Provider  acetaminophen (TYLENOL) 500 MG tablet Take 1,000 mg by mouth every 8 (eight) hours as needed for headache.   Yes [provider]  albuterol (PROVENTIL) (2.5 MG/3ML) 0.083% nebulizer solution Take 3 mLs (2.5  mg total) by nebulization every 2 (two) hours as needed for wheezing or shortness of breath. 02/07/17  Yes Luking, Elayne Snare, MD  benzonatate (TESSALON PERLES) 100 MG capsule Take 1 capsule (100 mg total) by mouth 3 (three) times daily as needed for cough. 10/18/17 10/18/18 Yes [name], MD  budesonide (PULMICORT) 0.5 MG/2ML nebulizer solution Take 2 mLs (0.5 mg total) by nebulization 2 (two) times daily. 10/18/17  Yes [name], MD  busPIRone (BUSPAR) 5 MG tablet One po TID Prn for anxiety 10/28/17  Yes Luking, Elayne Snare, MD  ferrous sulfate 325 (65 FE) MG EC tablet Take 325 mg by mouth daily with breakfast.   Yes [provider]  levothyroxine (SYNTHROID, LEVOTHROID) 75 MCG tablet Take half tablet on Monday then one tablet all other days 10/28/17  Yes Luking, Elayne Snare, MD  midodrine (PROAMATINE) 10 MG tablet Take 1 tablet (10 mg total) by mouth 3 (three) times daily with meals. 10/18/17  Yes [name], MD  Multiple Vitamins-Minerals (EMERGEN-C IMMUNE PO) Take 1-2 each by mouth daily. GUMMIES   Yes [provider]  ondansetron (ZOFRAN ODT) 8 MG  disintegrating tablet Take 1 tablet (8 mg total) by mouth every 8 (eight) hours as needed for nausea or vomiting. 10/15/17  Yes Kathleen Drown, MD  oxyCODONE-acetaminophen (PERCOCET) 7.5-325 MG tablet Take 1 tablet by mouth 3 (three) times daily as needed for severe pain. 10/03/17  Yes Kathleen Drown, MD  pantoprazole (PROTONIX) 40 MG tablet TAKE 1 TABLET BY MOUTH ONCE DAILY 02/20/17  Yes Kathleen Drown, MD  predniSONE (DELTASONE) 10 MG tablet Take 1 tablet (10 mg total) by mouth daily with breakfast. 10/22/17  Yes Isaac Bliss, Rayford Halsted, MD     Allergies:     Allergies  Allergen Reactions  . Bee Venom Anaphylaxis  . Other Other (See Comments)    Contrast Dye CT chest w/ contrast at OSH followed by SOB resolved w/ single dose steroids, never ENT swelling, intubation or pressors, has had multiple contrasted scans since  . Codeine Itching    Other reaction(s): ITCHING  . Morphine And Related Other (See Comments)    Unknown reaction- pt gets sick, dizzy, and confused Intolerance, not an allergy, received this in the emergency department recently without a problem.     Physical Exam:   Vitals  Blood pressure 118/62, pulse 72, temperature (!) 97.5 F (36.4 C), temperature source Oral, resp. rate 16, height 5\' 10"  (1.778 m), weight 99.8 kg (220 lb), SpO2 94 %.  1.  General: Appears in no acute distress  2. Psychiatric:  Intact judgement and  insight, awake alert, oriented x 3.  3. Neurologic: No focal neurological deficits, all cranial nerves intact.Strength 5/5 all 4 extremities, sensation intact all 4 extremities, plantars down going.  4. Eyes :  anicteric sclerae, moist conjunctivae with no lid lag. PERRLA.  5. ENMT:  Oropharynx clear with moist mucous membranes and good dentition  6. Neck:  supple, no cervical lymphadenopathy appriciated, No thyromegaly  7. Respiratory : Normal respiratory effort, good air movement bilaterally,clear to  auscultation bilaterally  8.  Cardiovascular : RRR, no gallops, rubs or murmurs, no leg edema  9. Gastrointestinal:  Positive bowel sounds, abdomen soft, mild diffuse tenderness to palpation ,no hepatosplenomegaly, no rigidity or guarding       10. Skin:  No cyanosis, normal texture and turgor, no rash, lesions or ulcers  11.Musculoskeletal:  Good muscle tone,  joints appear normal , no effusions,  normal range of motion    Data Review:    CBC Recent Labs  Lab 11/04/17 1110  WBC 10.2  HGB 13.2  HCT 42.6  PLT 344  MCV 90.6  MCH 28.1  MCHC 31.0  RDW 15.2   ------------------------------------------------------------------------------------------------------------------  Chemistries  Recent Labs  Lab 11/04/17 1110  NA 140  K 3.9  CL 103  CO2 27  GLUCOSE 91  BUN 13  CREATININE 0.84  CALCIUM 9.1  AST 20  ALT 22  ALKPHOS 59  BILITOT 0.9   ------------------------------------------------------------------------------------------------------------------  ------------------------------------------------------------------------------------------------------------------ GFR: Estimated Creatinine Clearance: 96.8 mL/min (by C-G formula based on SCr of 0.84 mg/dL). Liver Function Tests: Recent Labs  Lab 11/04/17 1110  AST 20  ALT 22  ALKPHOS 59  BILITOT 0.9  PROT 6.8  ALBUMIN 3.7   Recent Labs  Lab 11/04/17 1110  LIPASE 28   No results for input(s): AMMONIA in the last 168 hours. Coagulation Profile: No results for input(s): INR, PROTIME in the last 168 hours. Cardiac Enzymes: Recent Labs  Lab 11/04/17 1403  TROPONINI <0.03    --------------------------------------------------------------------------------------------------------------- Urine analysis:    Component Value Date/Time   COLORURINE YELLOW 11/04/2017 [place] 11/04/2017 1745   LABSPEC 1.008 11/04/2017 1745   PHURINE 5.0 11/04/2017 1745   GLUCOSEU NEGATIVE 11/04/2017 1745   GLUCOSEU NEGATIVE  05/29/2010 1622   HGBUR NEGATIVE 11/04/2017 1745   BILIRUBINUR NEGATIVE 11/04/2017 1745   BILIRUBINUR Positive 08/16/2015 0928   KETONESUR NEGATIVE 11/04/2017 1745   PROTEINUR NEGATIVE 11/04/2017 1745   UROBILINOGEN 0.2 06/16/2014 0942   NITRITE NEGATIVE 11/04/2017 1745   LEUKOCYTESUR NEGATIVE 11/04/2017 1745      Imaging Results:    Dg Chest 2 View  Result Date: 11/04/2017 CLINICAL DATA:  Nausea and vomiting EXAM: CHEST - 2 VIEW COMPARISON:  10/21/2017 FINDINGS: Cardiac shadow is stable. The lungs are well aerated bilaterally. No acute infiltrate is seen. Chronic scarring is again noted similar to that seen on the prior CT examination in the posterior aspect of the upper lobes bilaterally. No effusion is seen. No bony abnormality is noted. IMPRESSION: Chronic scarring.  No acute abnormality noted. Electronically Signed   By: Inez Catalina M.D.   On: 11/04/2017 14:46    My personal review of EKG: Rhythm NSR, Rate    Assessment & Plan:    Principal Problem:   Gastroenteritis Active Problems:   Sarcoidosis   Nausea and vomiting   Hypotension   Adrenal insufficiency (HCC)   Hypothyroidism   Abdominal pain   1. Gastroenteritis-patient has nausea vomiting and diarrhea with abdominal pain likely viral.  Labs shows no significant abnormality.  Will start on clear liquid diet, supportive care with IV fluids.  Advance diet as tolerated.  Consider imaging studies in a.m. if patient does not improve with supportive care. 2. Adrenal insufficiency-the patient was hypotensive when she arrived in ED.  We will continue with IV stress  dose steroids with hydrocortisone 50 mg IV every 8 hours.  Continue midodrine 3. Sarcoidosis-patient is on chronic prednisone therapy at home.  Continue with prednisone at discharge.  Continue albuterol as needed 4. GERD-continue Protonix 5. Hypotension-continue midodrine 10 mg 3 times daily  6. Anxiety-continue BuSpar  7. Hypothyroidism-continue  Synthroid    DVT Prophylaxis-   Lovenox   AM Labs Ordered, also please review Full Orders  Family Communication: Admission, patients condition and plan of care including tests being ordered have been discussed with the patient and her daughter at bedside*  who indicate understanding and agree with the plan and Code Status.  Code Status: Full code  Admission status: Observation  Time spent in minutes : 60 minutes   Oswald Hillock M.D on 11/04/2017 at 8:17 PM  Between 7am to 7pm - Pager - [phone]. After 7pm go to [email or web address] - password [facility]  [facility] - Office  [phone]

## 2017-11-04 NOTE — ED Notes (Signed)
Pt ambulated with oxygen 95% while walking.

## 2017-11-05 ENCOUNTER — Ambulatory Visit: Payer: 59 | Admitting: Family Medicine

## 2017-11-05 DIAGNOSIS — E2740 Unspecified adrenocortical insufficiency: Secondary | ICD-10-CM | POA: Diagnosis not present

## 2017-11-05 DIAGNOSIS — E032 Hypothyroidism due to medicaments and other exogenous substances: Secondary | ICD-10-CM

## 2017-11-05 DIAGNOSIS — I959 Hypotension, unspecified: Secondary | ICD-10-CM | POA: Diagnosis not present

## 2017-11-05 DIAGNOSIS — K529 Noninfective gastroenteritis and colitis, unspecified: Secondary | ICD-10-CM | POA: Diagnosis not present

## 2017-11-05 DIAGNOSIS — R112 Nausea with vomiting, unspecified: Secondary | ICD-10-CM | POA: Diagnosis not present

## 2017-11-05 DIAGNOSIS — D869 Sarcoidosis, unspecified: Secondary | ICD-10-CM | POA: Diagnosis not present

## 2017-11-05 DIAGNOSIS — E039 Hypothyroidism, unspecified: Secondary | ICD-10-CM | POA: Diagnosis not present

## 2017-11-05 DIAGNOSIS — E271 Primary adrenocortical insufficiency: Secondary | ICD-10-CM | POA: Diagnosis not present

## 2017-11-05 LAB — C DIFFICILE QUICK SCREEN W PCR REFLEX
C Diff antigen: NEGATIVE
C Diff interpretation: NOT DETECTED
C Diff toxin: NEGATIVE

## 2017-11-05 LAB — COMPREHENSIVE METABOLIC PANEL
ALT: 22 U/L (ref 14–54)
AST: 16 U/L (ref 15–41)
Albumin: 3.2 g/dL — ABNORMAL LOW (ref 3.5–5.0)
Alkaline Phosphatase: 58 U/L (ref 38–126)
Anion gap: 10 (ref 5–15)
BUN: 15 mg/dL (ref 6–20)
CO2: 23 mmol/L (ref 22–32)
Calcium: 8.3 mg/dL — ABNORMAL LOW (ref 8.9–10.3)
Chloride: 110 mmol/L (ref 101–111)
Creatinine, Ser: 0.78 mg/dL (ref 0.44–1.00)
GFR calc Af Amer: >60 mL/min (ref >60)
GFR calc non Af Amer: >60 mL/min (ref >60)
Glucose, Bld: 96 mg/dL (ref 65–99)
Potassium: 4 mmol/L (ref 3.5–5.1)
Sodium: 143 mmol/L (ref 135–145)
Total Bilirubin: 0.6 mg/dL (ref 0.3–1.2)
Total Protein: 6 g/dL — ABNORMAL LOW (ref 6.5–8.1)

## 2017-11-05 LAB — CBC
HCT: 40.2 % (ref 36.0–46.0)
Hemoglobin: 12.4 g/dL (ref 12.0–15.0)
MCH: 28.1 pg (ref 26.0–34.0)
MCHC: 30.8 g/dL (ref 30.0–36.0)
MCV: 91.2 fL (ref 78.0–100.0)
Platelets: 357 10*3/uL (ref 150–400)
RBC: 4.41 MIL/uL (ref 3.87–5.11)
RDW: 15.4 % (ref 11.5–15.5)
WBC: 8.2 10*3/uL (ref 4.0–10.5)

## 2017-11-05 NOTE — Progress Notes (Signed)
PROGRESS NOTE    Kathleen [name]  PPJ:[id number] DOB: 05/14/1961 DOA: 11/04/2017 PCP: [name], MD     Brief Narrative:  57 year old woman admitted from home on 3/11 due to abdominal pain, diarrhea, nausea, vomiting and weakness.  She was recently admitted with influenza A.  She also has a history of adrenal insufficiency and sarcoidosis and is prednisone dependent.  She was found to be hypotensive in the ED which resolved after administration of Solu-Cortef.  Admission requested.   Assessment & Plan:   Principal Problem:   Gastroenteritis Active Problems:   Sarcoidosis   Nausea and vomiting   Hypotension   Adrenal insufficiency (HCC)   Hypothyroidism   Abdominal pain   Abdominal pain, nausea, vomiting, diarrhea -Likely acute viral gastroenteritis, patient is already improving. -She still feels very weak. -No recent antibiotic use that makes me suspicious for C. difficile. -Continue IV fluids. -Monitor for continued symptomatic improvement.  History of adrenal insufficiency - Agree with continued stress dose steroids especially given acute illness. -Will likely need prednisone taper on discharge. -Continue Middaugh drain  Hypothyroidism -Continue Synthroid  Anxiety -Continue BuSpar  GERD -Continue Protonix   DVT prophylaxis: Lovenox Code Status: Full code  Family Communication: Patient only Disposition Plan: Anticipate discharge home in 24 hours  Consultants:   None  Procedures:   None  Antimicrobials:  Anti-infectives (From admission, onward)   None       Subjective: In bed, feels very weak, states has had about 2 episodes of diarrhea this morning, no emesis  Objective: Vitals:   11/04/17 2100 11/04/17 2144 11/05/17 0630 11/05/17 1200  BP: (!) 113/44 (!) 149/84 (!) 105/58 (!) 123/53  Pulse: 79 89 73 61  Resp: (!) 21 20 20    Temp:  97.8 F (36.6 C) 97.6 F (36.4 C)   TempSrc:  Oral Oral   SpO2: 91% 93% 95%   Weight:  100.3 kg (221  lb 3.2 oz)    Height:  5\' 10"  (1.778 m)      Intake/Output Summary (Last 24 hours) at 11/05/2017 1451 Last data filed at 11/05/2017 1442 Gross per 24 hour  Intake 2780 ml  Output 1450 ml  Net 1330 ml   Filed Weights   11/04/17 1051 11/04/17 2144  Weight: 99.8 kg (220 lb) 100.3 kg (221 lb 3.2 oz)    Examination:  General exam: Alert, awake, oriented x 3 Respiratory system: Clear to auscultation. Respiratory effort normal. Cardiovascular system:RRR. No murmurs, rubs, gallops. Gastrointestinal system: Abdomen is nondistended, soft and nontender. No organomegaly or masses felt. Normal bowel sounds heard. Central nervous system: Alert and oriented. No focal neurological deficits. Extremities: No C/C/E, +pedal pulses Skin: No rashes, lesions or ulcers Psychiatry: Judgement and insight appear normal. Mood & affect appropriate.     Data Reviewed: I have personally reviewed following labs and imaging studies  CBC: Recent Labs  Lab 11/04/17 1110 11/05/17 0606  WBC 10.2 8.2  HGB 13.2 12.4  HCT 42.6 40.2  MCV 90.6 91.2  PLT 344 580   Basic Metabolic Panel: Recent Labs  Lab 11/04/17 1110 11/05/17 0606  NA 140 143  K 3.9 4.0  CL 103 110  CO2 27 23  GLUCOSE 91 96  BUN 13 15  CREATININE 0.84 0.78  CALCIUM 9.1 8.3*   GFR: Estimated Creatinine Clearance: 101.9 mL/min (by C-G formula based on SCr of 0.78 mg/dL). Liver Function Tests: Recent Labs  Lab 11/04/17 1110 11/05/17 0606  AST 20 16  ALT 22 22  ALKPHOS 59 58  BILITOT 0.9 0.6  PROT 6.8 6.0*  ALBUMIN 3.7 3.2*   Recent Labs  Lab 11/04/17 1110  LIPASE 28   No results for input(s): AMMONIA in the last 168 hours. Coagulation Profile: No results for input(s): INR, PROTIME in the last 168 hours. Cardiac Enzymes: Recent Labs  Lab 11/04/17 1403  TROPONINI <0.03   BNP (last 3 results) No results for input(s): PROBNP in the last 8760 hours. HbA1C: No results for input(s): HGBA1C in the last 72  hours. CBG: No results for input(s): GLUCAP in the last 168 hours. Lipid Profile: No results for input(s): CHOL, HDL, LDLCALC, TRIG, CHOLHDL, LDLDIRECT in the last 72 hours. Thyroid Function Tests: No results for input(s): TSH, T4TOTAL, FREET4, T3FREE, THYROIDAB in the last 72 hours. Anemia Panel: No results for input(s): VITAMINB12, FOLATE, FERRITIN, TIBC, IRON, RETICCTPCT in the last 72 hours. Urine analysis:    Component Value Date/Time   COLORURINE YELLOW 11/04/2017 1745   APPEARANCEUR CLEAR 11/04/2017 1745   LABSPEC 1.008 11/04/2017 1745   PHURINE 5.0 11/04/2017 1745   GLUCOSEU NEGATIVE 11/04/2017 1745   GLUCOSEU NEGATIVE 05/29/2010 1622   HGBUR NEGATIVE 11/04/2017 1745   BILIRUBINUR NEGATIVE 11/04/2017 1745   BILIRUBINUR Positive 08/16/2015 0928   KETONESUR NEGATIVE 11/04/2017 1745   PROTEINUR NEGATIVE 11/04/2017 1745   UROBILINOGEN 0.2 06/16/2014 0942   NITRITE NEGATIVE 11/04/2017 1745   LEUKOCYTESUR NEGATIVE 11/04/2017 1745   Sepsis Labs: @LABRCNTIP (procalcitonin:4,lacticidven:4)  ) Recent Results (from the past 240 hour(s))  C difficile quick scan w PCR reflex     Status: None   Collection Time: 11/05/17  4:08 AM  Result Value Ref Range Status   C Diff antigen NEGATIVE NEGATIVE Final   C Diff toxin NEGATIVE NEGATIVE Final   C Diff interpretation No C. difficile detected.  Final    Comment: Performed at [facility], [address], [place], [place] [address]         Radiology Studies: Dg Chest 2 View  Result Date: 11/04/2017 CLINICAL DATA:  Nausea and vomiting EXAM: CHEST - 2 VIEW COMPARISON:  10/21/2017 FINDINGS: Cardiac shadow is stable. The lungs are well aerated bilaterally. No acute infiltrate is seen. Chronic scarring is again noted similar to that seen on the prior CT examination in the posterior aspect of the upper lobes bilaterally. No effusion is seen. No bony abnormality is noted. IMPRESSION: Chronic scarring.  No acute abnormality noted.  Electronically Signed   By: Inez Catalina M.D.   On: 11/04/2017 14:46        Scheduled Meds: . enoxaparin (LOVENOX) injection  50 mg Subcutaneous Q24H  . hydrocortisone sod succinate (SOLU-CORTEF) inj  50 mg Intravenous Q8H  . levothyroxine  75 mcg Oral QAC breakfast  . midodrine  10 mg Oral TID WC  . pantoprazole  40 mg Oral Daily   Continuous Infusions: . sodium chloride 75 mL/hr at 11/05/17 1134     LOS: 0 days    Time spent: 25 minutes. Greater than 50% of this time was spent in direct contact with the patient coordinating care.     Lelon Frohlich, MD [facility] Pager [phone]  If 7PM-7AM, please contact night-coverage [email or web address] Password [facility] 11/05/2017, 2:51 PM

## 2017-11-06 DIAGNOSIS — K529 Noninfective gastroenteritis and colitis, unspecified: Secondary | ICD-10-CM | POA: Diagnosis not present

## 2017-11-06 DIAGNOSIS — R1084 Generalized abdominal pain: Secondary | ICD-10-CM | POA: Diagnosis not present

## 2017-11-06 DIAGNOSIS — R112 Nausea with vomiting, unspecified: Secondary | ICD-10-CM

## 2017-11-06 DIAGNOSIS — R197 Diarrhea, unspecified: Secondary | ICD-10-CM

## 2017-11-06 DIAGNOSIS — E039 Hypothyroidism, unspecified: Secondary | ICD-10-CM | POA: Diagnosis not present

## 2017-11-06 DIAGNOSIS — D869 Sarcoidosis, unspecified: Secondary | ICD-10-CM | POA: Diagnosis not present

## 2017-11-06 DIAGNOSIS — E038 Other specified hypothyroidism: Secondary | ICD-10-CM

## 2017-11-06 DIAGNOSIS — I959 Hypotension, unspecified: Secondary | ICD-10-CM | POA: Diagnosis not present

## 2017-11-06 DIAGNOSIS — E271 Primary adrenocortical insufficiency: Secondary | ICD-10-CM

## 2017-11-06 MED ORDER — PREDNISONE 20 MG PO TABS: 60.0000 mg | ORAL_TABLET | Freq: Every day | ORAL | Status: DC

## 2017-11-06 MED ORDER — PREDNISONE 10 MG PO TABS: 60.0000 mg | ORAL_TABLET | Freq: Every day | ORAL | 0 refills | Status: DC

## 2017-11-06 MED FILL — predniSONE 10 MG (21) TBPK: 10 | 6 days supply | Qty: 21 | Fill #0

## 2017-11-06 NOTE — Discharge Summary (Signed)
Physician Discharge Summary  Kathleen [name] ZOX:[id number] DOB: 08-Jan-1961 DOA: 11/04/2017  PCP: Kathleen Drown, MD  Admit date: 11/04/2017 Discharge date: 11/06/2017  Admitted From: Home Disposition:  Home  Recommendations for Outpatient Follow-up:  1. Follow up with PCP in 1-2 weeks 2. Please obtain BMP/CBC in one week     Discharge Condition: Stable CODE STATUS:FULL Diet recommendation: Heart Healthy   Brief/Interim Summary: 57 y.o. female, with history of adrenal insufficiency on chronic prednisone therapy, fibromyalgia, sarcoidosis who was recently discharged from the hospital on February 26 after she was treated for influenza A.  Patient says that for 2 days she has been having nausea, vomiting and diarrhea which has been persistent.  Has been having difficulty keeping food down.  Complains of abdominal pain.  She was found to be hypotensive with SBP in 80s in ED.  She was given fluids and IV solucortef with improvement.  She was started back on her midodrine she remained afebrile hemodynamically stable throughout the rest of the hospitalization.. C. difficile was negative.  She was initially started on a clear liquid diet which she tolerated.  Her diet was gradually advanced.  On the day of discharge, her family brought her in chicken soup with bread which she ate without difficulty.  Urinalysis was negative for pyuria.  The patient was continued on Solu-Cortef during hospitalization.  She will be discharged home with a prednisone taper.    Discharge Diagnoses:  Abdominal pain, nausea, vomiting, diarrhea -Likely acute viral gastroenteritis -improved -Continue IV fluids. -Continued on IV Solu-Cortef -Initially started on clear liquid diet -C. difficile was negative -Diet was advanced which she tolerated.  Hypotension with adrenal insufficiency - Precipitated by the patient's gastroenteritis resulting and volume depletion and increased demand for steroid -Blood pressure  improved with IV  Solu-Cortef and IV fluids -prednisone taper on discharge.  At the end of the taper, the patient will resume her baseline dose of prednisone 10 mg daily -Continue midodrine  Sarcoidosis -Follow-up with pulmonology at [facility] [place] -Stable on room air -Continue home dose prednisone after prednisone taper  Fibromyalgia/chronic pain -Continue home dose of Percocet  Hypothyroidism -Continue Synthroid  Anxiety -Continue BuSpar  GERD -Continue Protonix     Discharge Instructions  Discharge Instructions    Diet - low sodium heart healthy   Complete by:  As directed    Increase activity slowly   Complete by:  As directed      Allergies as of 11/06/2017      Reactions   Bee Venom Anaphylaxis   Other Other (See Comments)   Contrast Dye CT chest w/ contrast at OSH followed by SOB resolved w/ single dose steroids, never ENT swelling, intubation or pressors, has had multiple contrasted scans since   Codeine Itching   Other reaction(s): ITCHING   Morphine And Related Other (See Comments)   Unknown reaction- pt gets sick, dizzy, and confused Intolerance, not an allergy, received this in the emergency department recently without a problem.      Medication List    TAKE these medications   acetaminophen 500 MG tablet Commonly known as:  TYLENOL Take 1,000 mg by mouth every 8 (eight) hours as needed for headache.   albuterol (2.5 MG/3ML) 0.083% nebulizer solution Commonly known as:  PROVENTIL Take 3 mLs (2.5 mg total) by nebulization every 2 (two) hours as needed for wheezing or shortness of breath.   benzonatate 100 MG capsule Commonly known as:  TESSALON PERLES Take 1 capsule (100 mg total)  by mouth 3 (three) times daily as needed for cough.   budesonide 0.5 MG/2ML nebulizer solution Commonly known as:  PULMICORT Take 2 mLs (0.5 mg total) by nebulization 2 (two) times daily.   busPIRone 5 MG tablet Commonly known as:  BUSPAR One po TID Prn for  anxiety   EMERGEN-C IMMUNE PO Take 1-2 each by mouth daily. GUMMIES   ferrous sulfate 325 (65 FE) MG EC tablet Take 325 mg by mouth daily with breakfast.   levothyroxine 75 MCG tablet Commonly known as:  SYNTHROID, LEVOTHROID Take half tablet on Monday then one tablet all other days   midodrine 10 MG tablet Commonly known as:  PROAMATINE Take 1 tablet (10 mg total) by mouth 3 (three) times daily with meals.   ondansetron 8 MG disintegrating tablet Commonly known as:  ZOFRAN ODT Take 1 tablet (8 mg total) by mouth every 8 (eight) hours as needed for nausea or vomiting.   oxyCODONE-acetaminophen 7.5-325 MG tablet Commonly known as:  PERCOCET Take 1 tablet by mouth 3 (three) times daily as needed for severe pain.   pantoprazole 40 MG tablet Commonly known as:  PROTONIX TAKE 1 TABLET BY MOUTH ONCE DAILY   predniSONE 10 MG tablet Commonly known as:  DELTASONE Take 1 tablet (10 mg total) by mouth daily with breakfast. What changed:  Another medication with the same name was added. Make sure you understand how and when to take each.   predniSONE 10 MG tablet Commonly known as:  DELTASONE Take 6 tablets (60 mg total) by mouth daily with breakfast. And decrease by one tablet daily Start taking on:  11/07/2017 What changed:  You were already taking a medication with the same name, and this prescription was added. Make sure you understand how and when to take each.       Allergies  Allergen Reactions  . Bee Venom Anaphylaxis  . Other Other (See Comments)    Contrast Dye CT chest w/ contrast at OSH followed by SOB resolved w/ single dose steroids, never ENT swelling, intubation or pressors, has had multiple contrasted scans since  . Codeine Itching    Other reaction(s): ITCHING  . Morphine And Related Other (See Comments)    Unknown reaction- pt gets sick, dizzy, and confused Intolerance, not an allergy, received this in the emergency department recently without a problem.     Consultations:  none   Procedures/Studies: Dg Chest 2 View  Result Date: 11/04/2017 CLINICAL DATA:  Nausea and vomiting EXAM: CHEST - 2 VIEW COMPARISON:  10/21/2017 FINDINGS: Cardiac shadow is stable. The lungs are well aerated bilaterally. No acute infiltrate is seen. Chronic scarring is again noted similar to that seen on the prior CT examination in the posterior aspect of the upper lobes bilaterally. No effusion is seen. No bony abnormality is noted. IMPRESSION: Chronic scarring.  No acute abnormality noted. Electronically Signed   By: Inez Catalina M.D.   On: 11/04/2017 14:46   Dg Chest 2 View  Result Date: 10/21/2017 CLINICAL DATA:  Chest pain EXAM: CHEST  2 VIEW COMPARISON:  10/15/2017, 09/30/2017, 08/08/2017, 08/21/2016, CT chest 09/11/2012, 10/20/2015 FINDINGS: Coarse chronic interstitial opacity. No acute consolidation or effusion. Posterior upper consolidation similar compared to prior presumably corresponds to the consolidation and scarring noted on prior chest CT. Stable cardiomediastinal silhouette. No pneumothorax. IMPRESSION: No active cardiopulmonary disease. Similar appearance coarse chronic interstitial opacity and upper lobe posterior lung scarring and fibrosis. Electronically Signed   By: Donavan Foil M.D.   On:  10/21/2017 14:13   Dg Chest 2 View  Result Date: 10/15/2017 CLINICAL DATA:  Cough and congestion EXAM: CHEST  2 VIEW COMPARISON:  September 30, 2017 and October 20, 2015 FINDINGS: There is scarring in the left upper lobe posteriorly. A lesser degree of scarring is noted in the left base and right upper lobe. There is no edema or consolidation. The heart size and pulmonary vascularity are normal. No adenopathy. There is aortic atherosclerosis. No evident bone lesions. IMPRESSION: Areas of scarring, most notably in the posterior segment of the left upper lobe. No edema or consolidation. Heart size normal. There is aortic atherosclerosis. Aortic Atherosclerosis  (ICD10-I70.0). Electronically Signed   By: Lowella Grip III M.D.   On: 10/15/2017 17:35   Ct Angio Chest Pe W/cm &/or Wo Cm  Result Date: 10/21/2017 CLINICAL DATA:  57 year old female with recent influenza diagnosis. Cough, dizziness, fatigue. Sarcoidosis. EXAM: CT ANGIOGRAPHY CHEST WITH CONTRAST TECHNIQUE: Multidetector CT imaging of the chest was performed using the standard protocol during bolus administration of intravenous contrast. Multiplanar CT image reconstructions and MIPs were obtained to evaluate the vascular anatomy. CONTRAST:  15mL ISOVUE-370 IOPAMIDOL (ISOVUE-370) INJECTION 76% COMPARISON:  CTA chest 09/11/2012. Chest radiographs today, and earlier. FINDINGS: Cardiovascular: Adequate contrast bolus timing in the pulmonary arterial tree. Chronic mild enlargement of the central pulmonary arteries. No focal filling defect identified in the pulmonary arteries to suggest acute pulmonary embolism. Possible preferential contrast enhancement of the upper lobe rather than lower lobe pulmonary arteries, possibly related to the chronic lung disease. Negative visible aorta. Borderline to mild cardiomegaly appears stable since 2014. No pericardial effusion. Mediastinum/Nodes: Generally sub centimeter mediastinal lymph nodes. The largest nodes are in the right paratracheal station and are stable since 2014. No definite hilar lymphadenopathy. No acute mediastinal findings. No axillary lymphadenopathy. Lungs/Pleura: Larger lung volumes compared to 2014. The major airways remain patent. Chronic bilateral upper lobe perihilar bronchiectasis, consolidation, and architectural distortion has not significantly changed. Superimposed scattered subpleural pulmonary scarring with mild additional architectural distortion. No definite acute pulmonary opacity. No pleural effusion. Upper Abdomen: Negative visible liver, gallbladder, spleen, pancreas, adrenal glands, and bowel in the upper abdomen. Musculoskeletal: No  acute osseous abnormality identified. Review of the MIP images confirms the above findings. IMPRESSION: 1.  Negative for acute pulmonary embolus.  Negative visible aorta. 2. No definite acute pulmonary finding. Chronic lung disease with bilateral upper lobe perihilar bronchiectasis, consolidation, and architectural distortion. 3. Stable mild mediastinal lymphadenopathy compatible with chronic Sarcoidosis. Electronically Signed   By: Genevie Ann M.D.   On: 10/21/2017 16:44        Discharge Exam: Vitals:   11/06/17 0010 11/06/17 0415  BP: 132/62 (!) 111/50  Pulse: 68 60  Resp:  16  Temp:  98.3 F (36.8 C)  SpO2: 95% 95%   Vitals:   11/05/17 1930 11/05/17 2222 11/06/17 0010 11/06/17 0415  BP:  (!) 112/44 132/62 (!) 111/50  Pulse:  60 68 60  Resp:  18  16  Temp:  98.4 F (36.9 C)  98.3 F (36.8 C)  TempSrc:  Oral  Oral  SpO2: 95% 93% 95% 95%  Weight:      Height:        General: Pt is alert, awake, not in acute distress Cardiovascular: RRR, S1/S2 +, no rubs, no gallops Respiratory: CTA bilaterally, no wheezing, no rhonchi Abdominal: Soft, NT, ND, bowel sounds + Extremities: no edema, no cyanosis   The results of significant diagnostics from this hospitalization (including imaging, microbiology,  ancillary and laboratory) are listed below for reference.    Significant Diagnostic Studies: Dg Chest 2 View  Result Date: 11/04/2017 CLINICAL DATA:  Nausea and vomiting EXAM: CHEST - 2 VIEW COMPARISON:  10/21/2017 FINDINGS: Cardiac shadow is stable. The lungs are well aerated bilaterally. No acute infiltrate is seen. Chronic scarring is again noted similar to that seen on the prior CT examination in the posterior aspect of the upper lobes bilaterally. No effusion is seen. No bony abnormality is noted. IMPRESSION: Chronic scarring.  No acute abnormality noted. Electronically Signed   By: Inez Catalina M.D.   On: 11/04/2017 14:46   Dg Chest 2 View  Result Date: 10/21/2017 CLINICAL DATA:   Chest pain EXAM: CHEST  2 VIEW COMPARISON:  10/15/2017, 09/30/2017, 08/08/2017, 08/21/2016, CT chest 09/11/2012, 10/20/2015 FINDINGS: Coarse chronic interstitial opacity. No acute consolidation or effusion. Posterior upper consolidation similar compared to prior presumably corresponds to the consolidation and scarring noted on prior chest CT. Stable cardiomediastinal silhouette. No pneumothorax. IMPRESSION: No active cardiopulmonary disease. Similar appearance coarse chronic interstitial opacity and upper lobe posterior lung scarring and fibrosis. Electronically Signed   By: Donavan Foil M.D.   On: 10/21/2017 14:13   Dg Chest 2 View  Result Date: 10/15/2017 CLINICAL DATA:  Cough and congestion EXAM: CHEST  2 VIEW COMPARISON:  September 30, 2017 and October 20, 2015 FINDINGS: There is scarring in the left upper lobe posteriorly. A lesser degree of scarring is noted in the left base and right upper lobe. There is no edema or consolidation. The heart size and pulmonary vascularity are normal. No adenopathy. There is aortic atherosclerosis. No evident bone lesions. IMPRESSION: Areas of scarring, most notably in the posterior segment of the left upper lobe. No edema or consolidation. Heart size normal. There is aortic atherosclerosis. Aortic Atherosclerosis (ICD10-I70.0). Electronically Signed   By: Lowella Grip III M.D.   On: 10/15/2017 17:35   Ct Angio Chest Pe W/cm &/or Wo Cm  Result Date: 10/21/2017 CLINICAL DATA:  57 year old female with recent influenza diagnosis. Cough, dizziness, fatigue. Sarcoidosis. EXAM: CT ANGIOGRAPHY CHEST WITH CONTRAST TECHNIQUE: Multidetector CT imaging of the chest was performed using the standard protocol during bolus administration of intravenous contrast. Multiplanar CT image reconstructions and MIPs were obtained to evaluate the vascular anatomy. CONTRAST:  15mL ISOVUE-370 IOPAMIDOL (ISOVUE-370) INJECTION 76% COMPARISON:  CTA chest 09/11/2012. Chest radiographs today,  and earlier. FINDINGS: Cardiovascular: Adequate contrast bolus timing in the pulmonary arterial tree. Chronic mild enlargement of the central pulmonary arteries. No focal filling defect identified in the pulmonary arteries to suggest acute pulmonary embolism. Possible preferential contrast enhancement of the upper lobe rather than lower lobe pulmonary arteries, possibly related to the chronic lung disease. Negative visible aorta. Borderline to mild cardiomegaly appears stable since 2014. No pericardial effusion. Mediastinum/Nodes: Generally sub centimeter mediastinal lymph nodes. The largest nodes are in the right paratracheal station and are stable since 2014. No definite hilar lymphadenopathy. No acute mediastinal findings. No axillary lymphadenopathy. Lungs/Pleura: Larger lung volumes compared to 2014. The major airways remain patent. Chronic bilateral upper lobe perihilar bronchiectasis, consolidation, and architectural distortion has not significantly changed. Superimposed scattered subpleural pulmonary scarring with mild additional architectural distortion. No definite acute pulmonary opacity. No pleural effusion. Upper Abdomen: Negative visible liver, gallbladder, spleen, pancreas, adrenal glands, and bowel in the upper abdomen. Musculoskeletal: No acute osseous abnormality identified. Review of the MIP images confirms the above findings. IMPRESSION: 1.  Negative for acute pulmonary embolus.  Negative visible aorta. 2. No  definite acute pulmonary finding. Chronic lung disease with bilateral upper lobe perihilar bronchiectasis, consolidation, and architectural distortion. 3. Stable mild mediastinal lymphadenopathy compatible with chronic Sarcoidosis. Electronically Signed   By: Genevie Ann M.D.   On: 10/21/2017 16:44     Microbiology: Recent Results (from the past 240 hour(s))  C difficile quick scan w PCR reflex     Status: None   Collection Time: 11/05/17  4:08 AM  Result Value Ref Range Status   C Diff  antigen NEGATIVE NEGATIVE Final   C Diff toxin NEGATIVE NEGATIVE Final   C Diff interpretation No C. difficile detected.  Final    Comment: Performed at [facility], [address] [place], [place] [address]     Labs: Basic Metabolic Panel: Recent Labs  Lab 11/04/17 1110 11/05/17 0606  NA 140 143  K 3.9 4.0  CL 103 110  CO2 27 23  GLUCOSE 91 96  BUN 13 15  CREATININE 0.84 0.78  CALCIUM 9.1 8.3*   Liver Function Tests: Recent Labs  Lab 11/04/17 1110 11/05/17 0606  AST 20 16  ALT 22 22  ALKPHOS 59 58  BILITOT 0.9 0.6  PROT 6.8 6.0*  ALBUMIN 3.7 3.2*   Recent Labs  Lab 11/04/17 1110  LIPASE 28   No results for input(s): AMMONIA in the last 168 hours. CBC: Recent Labs  Lab 11/04/17 1110 11/05/17 0606  WBC 10.2 8.2  HGB 13.2 12.4  HCT 42.6 40.2  MCV 90.6 91.2  PLT 344 357   Cardiac Enzymes: Recent Labs  Lab 11/04/17 1403  TROPONINI <0.03   BNP: Invalid input(s): POCBNP CBG: No results for input(s): GLUCAP in the last 168 hours.  Time coordinating discharge:  Greater than 30 minutes  Signed:  Orson Eva, DO [facility] Pager: [phone] 11/06/2017, 11:38 AM

## 2017-11-06 NOTE — Discharge Instructions (Signed)
See primary doctor in one to two weeks and obtain BMP and CBC lab work

## 2017-11-06 NOTE — Progress Notes (Signed)
IV removed and discharge papers reviewed.   To call office for f/u appt and have labs then.  To pick up scripts from pharmacy.  Family to drive home.

## 2017-11-06 NOTE — Care Management Note (Signed)
Case Management Note  Patient Details  Name: BAANI BOBER MRN: [id number] Date of Birth: October 05, 1960    Expected Discharge Date:  11/06/17               Expected Discharge Plan:  [place]  In-House Referral:     Discharge planning Services  CM Consult  Post Acute Care Choice:  [place], Resumption of Svcs/PTA Provider Choice offered to:  Patient  DME Arranged:    DME Agency:     HH Arranged:    [place] Agency:     Status of Service:  Completed, signed off  If discussed at [employer] of Stay Meetings, dates discussed:    Additional Comments: Patient discharging home today with resumption of home health with [facility]. Juliann Pulse of [facility] aware of DC.   Tawfiq Favila, Chauncey Reading, RN 11/06/2017, 1:18 PM

## 2017-11-06 NOTE — Care Management Obs Status (Signed)
[facility] NOTIFICATION   Patient Details  Name: Kathleen [name] MRN: [id number] Date of Birth: Mar 02, 1961   Medicare Observation Status Notification Given:  Yes    Christian Treadway, Chauncey Reading, RN 11/06/2017, 7:33 AM

## 2017-11-07 NOTE — Telephone Encounter (Signed)
Order faxed & filed  °

## 2017-11-08 DIAGNOSIS — J111 Influenza due to unidentified influenza virus with other respiratory manifestations: Secondary | ICD-10-CM | POA: Diagnosis not present

## 2017-11-08 DIAGNOSIS — J984 Other disorders of lung: Secondary | ICD-10-CM | POA: Diagnosis not present

## 2017-11-08 DIAGNOSIS — E272 Addisonian crisis: Secondary | ICD-10-CM | POA: Diagnosis not present

## 2017-11-08 DIAGNOSIS — D869 Sarcoidosis, unspecified: Secondary | ICD-10-CM | POA: Diagnosis not present

## 2017-11-08 DIAGNOSIS — K219 Gastro-esophageal reflux disease without esophagitis: Secondary | ICD-10-CM | POA: Diagnosis not present

## 2017-11-08 DIAGNOSIS — E785 Hyperlipidemia, unspecified: Secondary | ICD-10-CM | POA: Diagnosis not present

## 2017-11-08 DIAGNOSIS — I951 Orthostatic hypotension: Secondary | ICD-10-CM | POA: Diagnosis not present

## 2017-11-08 DIAGNOSIS — G629 Polyneuropathy, unspecified: Secondary | ICD-10-CM | POA: Diagnosis not present

## 2017-11-08 DIAGNOSIS — G8929 Other chronic pain: Secondary | ICD-10-CM | POA: Diagnosis not present

## 2017-11-11 DIAGNOSIS — D869 Sarcoidosis, unspecified: Secondary | ICD-10-CM | POA: Diagnosis not present

## 2017-11-11 DIAGNOSIS — J111 Influenza due to unidentified influenza virus with other respiratory manifestations: Secondary | ICD-10-CM | POA: Diagnosis not present

## 2017-11-11 DIAGNOSIS — G8929 Other chronic pain: Secondary | ICD-10-CM | POA: Diagnosis not present

## 2017-11-11 DIAGNOSIS — I951 Orthostatic hypotension: Secondary | ICD-10-CM | POA: Diagnosis not present

## 2017-11-11 DIAGNOSIS — J984 Other disorders of lung: Secondary | ICD-10-CM | POA: Diagnosis not present

## 2017-11-11 DIAGNOSIS — E272 Addisonian crisis: Secondary | ICD-10-CM | POA: Diagnosis not present

## 2017-11-11 DIAGNOSIS — E785 Hyperlipidemia, unspecified: Secondary | ICD-10-CM | POA: Diagnosis not present

## 2017-11-11 DIAGNOSIS — K219 Gastro-esophageal reflux disease without esophagitis: Secondary | ICD-10-CM | POA: Diagnosis not present

## 2017-11-11 DIAGNOSIS — G629 Polyneuropathy, unspecified: Secondary | ICD-10-CM | POA: Diagnosis not present

## 2017-11-13 DIAGNOSIS — J111 Influenza due to unidentified influenza virus with other respiratory manifestations: Secondary | ICD-10-CM | POA: Diagnosis not present

## 2017-11-13 DIAGNOSIS — E785 Hyperlipidemia, unspecified: Secondary | ICD-10-CM | POA: Diagnosis not present

## 2017-11-13 DIAGNOSIS — E272 Addisonian crisis: Secondary | ICD-10-CM | POA: Diagnosis not present

## 2017-11-13 DIAGNOSIS — D869 Sarcoidosis, unspecified: Secondary | ICD-10-CM | POA: Diagnosis not present

## 2017-11-13 DIAGNOSIS — G8929 Other chronic pain: Secondary | ICD-10-CM | POA: Diagnosis not present

## 2017-11-13 DIAGNOSIS — G629 Polyneuropathy, unspecified: Secondary | ICD-10-CM | POA: Diagnosis not present

## 2017-11-13 DIAGNOSIS — I951 Orthostatic hypotension: Secondary | ICD-10-CM | POA: Diagnosis not present

## 2017-11-13 DIAGNOSIS — J984 Other disorders of lung: Secondary | ICD-10-CM | POA: Diagnosis not present

## 2017-11-13 DIAGNOSIS — K219 Gastro-esophageal reflux disease without esophagitis: Secondary | ICD-10-CM | POA: Diagnosis not present

## 2017-11-20 MED FILL — OXYCODONE/APAP 7.5/325MG: 7.5-325 | 30 days supply | Qty: 90 | Fill #0

## 2017-11-20 MED FILL — LEVOTHYROXINE 75 MCG TABLET: 75 | 30 days supply | Qty: 30 | Fill #1

## 2017-11-22 ENCOUNTER — Encounter: Payer: Self-pay | Admitting: Family Medicine

## 2017-11-22 ENCOUNTER — Ambulatory Visit (INDEPENDENT_AMBULATORY_CARE_PROVIDER_SITE_OTHER): Payer: 59 | Admitting: Family Medicine

## 2017-11-22 VITALS — BP 126/74 | Temp 97.7°F | Ht 70.0 in | Wt 221.0 lb

## 2017-11-22 DIAGNOSIS — E2740 Unspecified adrenocortical insufficiency: Secondary | ICD-10-CM

## 2017-11-22 DIAGNOSIS — I959 Hypotension, unspecified: Secondary | ICD-10-CM | POA: Diagnosis not present

## 2017-11-22 MED ORDER — OXYCODONE-ACETAMINOPHEN 7.5-325 MG PO TABS: 1.0000 | ORAL_TABLET | Freq: Three times a day (TID) | ORAL | 0 refills | Status: DC | PRN

## 2017-11-22 NOTE — Progress Notes (Signed)
   Subjective:    Patient ID: Kathleen [name], female    DOB: May 01, 1961, 57 y.o.   MRN: [id number]  HPIpt arrives today and states she was admitted twice to the hospital with the flu. Still having a little cough.  This patient has had some bad luck over the past month and a half she has underlying adrenal insufficiency as well as sarcoid she is on prednisone on a regular basis she has had 3 separate episodes being admitted into the hospital one time with the flu the other 2 times with vomiting diarrhea low blood pressure and some dehydration she feels like she is improving.  She is frustrated by the number of times she had to go into the hospital.  Currently right now with a little bit of coughing but no wheezing or shortness of breath she is on her all of her medicines   Review of Systems  Constitutional: Negative for activity change and appetite change.  HENT: Negative for congestion.   Respiratory: Negative for cough.   Cardiovascular: Negative for chest pain.  Gastrointestinal: Negative for abdominal pain and vomiting.  Skin: Negative for color change.  Neurological: Negative for weakness.  Psychiatric/Behavioral: Negative for confusion.       Objective:   Physical Exam  Constitutional: She appears well-developed and well-nourished. No distress.  HENT:  Head: Normocephalic and atraumatic.  Eyes: Right eye exhibits no discharge. Left eye exhibits no discharge.  Neck: No tracheal deviation present.  Cardiovascular: Normal rate, regular rhythm and normal heart sounds.  No murmur heard. Pulmonary/Chest: Effort normal and breath sounds normal. No respiratory distress. She has no wheezes. She has no rales.  Musculoskeletal: She exhibits no edema.  Lymphadenopathy:    She has no cervical adenopathy.  Neurological: She is alert. She exhibits normal muscle tone.  Skin: Skin is warm and dry. No erythema.  Psychiatric: Her behavior is normal.  Vitals reviewed.         Assessment &  Plan:  Adrenal insufficiency continue the prednisone daily  Sarcoid continue the prednisone daily  No sign of any hypotension currently She will continue to take her midodrine 3 times daily to help support her blood pressure she will follow-up in approximately 3 months  Chronic pain medication given drug registry checked 2 prescriptions given this will get her into June when we will see her again she Artie has one other pain prescription that she just recently got filled  We will communicate with her endocrinologist to see if they recommend any additional increase with prednisone when she is having sickness to try to avoid hospitalization when possible  Recent labs in the hospital showed low calcium we will repeat metabolic 7

## 2017-11-25 ENCOUNTER — Encounter: Payer: Self-pay | Admitting: Family Medicine

## 2017-11-25 DIAGNOSIS — R0902 Hypoxemia: Secondary | ICD-10-CM | POA: Diagnosis not present

## 2017-11-25 DIAGNOSIS — J449 Chronic obstructive pulmonary disease, unspecified: Secondary | ICD-10-CM | POA: Diagnosis not present

## 2017-11-27 ENCOUNTER — Encounter: Payer: Self-pay | Admitting: Family Medicine

## 2017-12-02 MED FILL — predniSONE 10 MG TABS: 10 | 30 days supply | Qty: 30 | Fill #0

## 2017-12-02 MED FILL — BUDESONIDE 0.5 MG/2ML SUSP: 0.5 | 30 days supply | Qty: 120 | Fill #1

## 2017-12-04 ENCOUNTER — Telehealth: Payer: Self-pay | Admitting: Family Medicine

## 2017-12-04 DIAGNOSIS — I959 Hypotension, unspecified: Secondary | ICD-10-CM | POA: Diagnosis not present

## 2017-12-04 MED FILL — MIDODRINE HCL 5 MG TABLET: 5 | 30 days supply | Qty: 60 | Fill #3

## 2017-12-04 NOTE — Telephone Encounter (Signed)
Patient had O2 monitoring.  Patient's O2 saturation was low.  Please find out from the patient was these O2 monitoring overnight?  Or while she was asleep? (We may need to refer her to a sleep doctor for further evaluation)

## 2017-12-05 LAB — BASIC METABOLIC PANEL
BUN/Creatinine Ratio: 13 (ref 9–23)
BUN: 16 mg/dL (ref 6–24)
CO2: 21 mmol/L (ref 20–29)
Calcium: 9.6 mg/dL (ref 8.7–10.2)
Chloride: 106 mmol/L (ref 96–106)
Creatinine, Ser: 1.26 mg/dL — ABNORMAL HIGH (ref 0.57–1.00)
GFR calc Af Amer: 55 mL/min/{1.73_m2} — ABNORMAL LOW (ref >59)
GFR calc non Af Amer: 48 mL/min/{1.73_m2} — ABNORMAL LOW (ref >59)
Glucose: 117 mg/dL — ABNORMAL HIGH (ref 65–99)
Potassium: 4.8 mmol/L (ref 3.5–5.2)
Sodium: 142 mmol/L (ref 134–144)

## 2017-12-05 LAB — TSH: TSH: 0.527 u[IU]/mL (ref 0.450–4.500)

## 2017-12-05 NOTE — Telephone Encounter (Signed)
Spoke with patient. States she did the 02 monitoring overnight while asleep. States that when she was in the hospital, when her oxygen stats dropped she also had to wear the machine.

## 2017-12-08 NOTE — Telephone Encounter (Signed)
Please find out from the patient-does she use a CPAP machine currently? (There is a possibility we may need to refer her to a sleep medicine specialist)

## 2017-12-09 NOTE — Telephone Encounter (Signed)
Pt does not have a cpap machine

## 2017-12-11 NOTE — Telephone Encounter (Signed)
Pt wants to know if she can get oxygen to use at night. She does not think she could wear a cpap due to anxiety. Pt states her breathing is short sometimes. Offered appt this morning with carolyn she declined. States it has been going on for awhile.

## 2017-12-11 NOTE — Telephone Encounter (Signed)
Please go ahead and make order for nighttime oxygen-I am not sure if this will go through [place] or through [facility].  Please have [place] help with this.  If necessary please make sure either myself or Dr. Richardson Landry signed the order for this.  Also the patient should be having a referral to a sleep medicine specialist as well.  I am hopeful they will find a CPAP machine she can use.

## 2017-12-12 NOTE — Telephone Encounter (Signed)
Discussed with pt. Pt verbalized understanding. She would like a copy of the report sent to DR. Farley Ly in [place]. Copy faxed to his office fax # [phone]. Order for oxygen and copy of report faxed to [facility].

## 2017-12-12 NOTE — Telephone Encounter (Signed)
Pt declines sleep medicine specialist at this time. She states she wants to wait til she sees DR. Farley Ly and get oxygen and see how she is doing then

## 2017-12-12 NOTE — Telephone Encounter (Signed)
So noted 

## 2017-12-16 ENCOUNTER — Telehealth: Payer: Self-pay | Admitting: Family Medicine

## 2017-12-16 NOTE — Telephone Encounter (Signed)
Discussed with pt that oxygen was for night time only. Pt still wants order for portable tank. Pt aware dr Nicki Reaper is out of office til Thursday.

## 2017-12-16 NOTE — Telephone Encounter (Signed)
[facility] called pt and stated they were bringing oxygen out tomorrow and told pt to call and have the dr office to call them to request the portable oxygen. Need [place] to call today because they want to bring it tomorrow.

## 2017-12-16 NOTE — Telephone Encounter (Signed)
Pt is requesting a call from the nurse. Pt states that she has a question that she would like to discuss with a nurse. Please advise.

## 2017-12-17 ENCOUNTER — Ambulatory Visit: Payer: 59 | Admitting: Family Medicine

## 2017-12-17 DIAGNOSIS — D860 Sarcoidosis of lung: Secondary | ICD-10-CM | POA: Diagnosis not present

## 2017-12-17 DIAGNOSIS — M6281 Muscle weakness (generalized): Secondary | ICD-10-CM | POA: Diagnosis not present

## 2017-12-17 DIAGNOSIS — J849 Interstitial pulmonary disease, unspecified: Secondary | ICD-10-CM | POA: Diagnosis not present

## 2017-12-17 DIAGNOSIS — J9601 Acute respiratory failure with hypoxia: Secondary | ICD-10-CM | POA: Diagnosis not present

## 2017-12-19 NOTE — Telephone Encounter (Signed)
ok 

## 2017-12-19 NOTE — Telephone Encounter (Signed)
Patient states understanding and wants to hold off on [place] doing anything. She states she has an appointment with the pulmonary Dr on Monday she will discuss with him.

## 2017-12-19 NOTE — Telephone Encounter (Signed)
She may have a order for portable tank but please let the patient know that her insurance company more than likely will not cover it because it is nocturnal oxygen not for daytime because her oxygen saturations dropped only when she is sleeping

## 2017-12-20 MED FILL — OXYCODONE/APAP 7.5/325MG: 7.5-325 | 30 days supply | Qty: 90 | Fill #0

## 2017-12-23 DIAGNOSIS — R0602 Shortness of breath: Secondary | ICD-10-CM | POA: Diagnosis not present

## 2017-12-23 DIAGNOSIS — K219 Gastro-esophageal reflux disease without esophagitis: Secondary | ICD-10-CM | POA: Diagnosis not present

## 2017-12-23 DIAGNOSIS — R1032 Left lower quadrant pain: Secondary | ICD-10-CM | POA: Diagnosis not present

## 2017-12-23 DIAGNOSIS — F419 Anxiety disorder, unspecified: Secondary | ICD-10-CM | POA: Diagnosis not present

## 2017-12-23 DIAGNOSIS — D860 Sarcoidosis of lung: Secondary | ICD-10-CM | POA: Diagnosis not present

## 2017-12-23 DIAGNOSIS — J45909 Unspecified asthma, uncomplicated: Secondary | ICD-10-CM | POA: Diagnosis not present

## 2017-12-23 DIAGNOSIS — R1031 Right lower quadrant pain: Secondary | ICD-10-CM | POA: Diagnosis not present

## 2017-12-23 DIAGNOSIS — R112 Nausea with vomiting, unspecified: Secondary | ICD-10-CM | POA: Diagnosis not present

## 2017-12-23 DIAGNOSIS — R140 Abdominal distension (gaseous): Secondary | ICD-10-CM | POA: Diagnosis not present

## 2017-12-23 DIAGNOSIS — Z885 Allergy status to narcotic agent status: Secondary | ICD-10-CM | POA: Diagnosis not present

## 2017-12-23 DIAGNOSIS — R109 Unspecified abdominal pain: Secondary | ICD-10-CM | POA: Diagnosis not present

## 2017-12-23 DIAGNOSIS — Z760 Encounter for issue of repeat prescription: Secondary | ICD-10-CM | POA: Diagnosis not present

## 2017-12-23 DIAGNOSIS — F329 Major depressive disorder, single episode, unspecified: Secondary | ICD-10-CM | POA: Diagnosis not present

## 2017-12-23 DIAGNOSIS — M545 Low back pain: Secondary | ICD-10-CM | POA: Diagnosis not present

## 2017-12-24 MED FILL — PEG-3350 AND ELECTROLYTES S: 236 | 2 days supply | Qty: 4000 | Fill #0

## 2017-12-25 MED FILL — ONDANSETRON ODT 4 MG TABLET: 4 | 5 days supply | Qty: 14 | Fill #0

## 2017-12-31 MED FILL — predniSONE 10 MG TABS: 10 | 30 days supply | Qty: 30 | Fill #1

## 2017-12-31 MED FILL — MIDODRINE HCL 5 MG TABLET: 5 | 30 days supply | Qty: 60 | Fill #4

## 2017-12-31 MED FILL — PANTOPRAZOLE SOD DR 40 MG T: 40 | 90 days supply | Qty: 90 | Fill #3

## 2017-12-31 MED FILL — LEVOTHYROXINE 75 MCG TABLET: 75 | 30 days supply | Qty: 30 | Fill #2

## 2018-01-10 DIAGNOSIS — D860 Sarcoidosis of lung: Secondary | ICD-10-CM | POA: Diagnosis not present

## 2018-01-16 DIAGNOSIS — D860 Sarcoidosis of lung: Secondary | ICD-10-CM | POA: Diagnosis not present

## 2018-01-16 DIAGNOSIS — J9601 Acute respiratory failure with hypoxia: Secondary | ICD-10-CM | POA: Diagnosis not present

## 2018-01-16 DIAGNOSIS — M6281 Muscle weakness (generalized): Secondary | ICD-10-CM | POA: Diagnosis not present

## 2018-01-16 DIAGNOSIS — J849 Interstitial pulmonary disease, unspecified: Secondary | ICD-10-CM | POA: Diagnosis not present

## 2018-01-22 MED FILL — OXYCODONE/APAP 7.5/325MG: 7.5-325 | 30 days supply | Qty: 90 | Fill #0

## 2018-01-27 ENCOUNTER — Other Ambulatory Visit: Payer: Self-pay | Admitting: Family Medicine

## 2018-01-27 MED FILL — predniSONE 10 MG TABS: 10 | 30 days supply | Qty: 30 | Fill #2

## 2018-01-27 MED FILL — LEVOTHYROXINE 75 MCG TABLET: 75 | 30 days supply | Qty: 30 | Fill #3

## 2018-01-28 MED FILL — MIDODRINE HCL 5 MG TABLET: 5 | 30 days supply | Qty: 60 | Fill #0

## 2018-02-05 ENCOUNTER — Ambulatory Visit (INDEPENDENT_AMBULATORY_CARE_PROVIDER_SITE_OTHER): Payer: 59 | Admitting: Family Medicine

## 2018-02-05 ENCOUNTER — Encounter: Payer: Self-pay | Admitting: Family Medicine

## 2018-02-05 VITALS — Ht 70.0 in | Wt 221.6 lb

## 2018-02-05 DIAGNOSIS — E2740 Unspecified adrenocortical insufficiency: Secondary | ICD-10-CM | POA: Diagnosis not present

## 2018-02-05 DIAGNOSIS — M87052 Idiopathic aseptic necrosis of left femur: Secondary | ICD-10-CM | POA: Diagnosis not present

## 2018-02-05 DIAGNOSIS — E785 Hyperlipidemia, unspecified: Secondary | ICD-10-CM

## 2018-02-05 DIAGNOSIS — G894 Chronic pain syndrome: Secondary | ICD-10-CM | POA: Diagnosis not present

## 2018-02-05 DIAGNOSIS — Z1159 Encounter for screening for other viral diseases: Secondary | ICD-10-CM

## 2018-02-05 DIAGNOSIS — Z0000 Encounter for general adult medical examination without abnormal findings: Secondary | ICD-10-CM

## 2018-02-05 DIAGNOSIS — Z0001 Encounter for general adult medical examination with abnormal findings: Secondary | ICD-10-CM | POA: Diagnosis not present

## 2018-02-05 DIAGNOSIS — F320 Major depressive disorder, single episode, mild: Secondary | ICD-10-CM

## 2018-02-05 DIAGNOSIS — E039 Hypothyroidism, unspecified: Secondary | ICD-10-CM | POA: Diagnosis not present

## 2018-02-05 MED ORDER — BENZONATATE 100 MG PO CAPS: 100.0000 mg | ORAL_CAPSULE | Freq: Three times a day (TID) | ORAL | 5 refills | Status: DC | PRN

## 2018-02-05 MED ORDER — PANTOPRAZOLE SODIUM 40 MG PO TBEC: 40.0000 mg | DELAYED_RELEASE_TABLET | Freq: Every day | ORAL | 3 refills | Status: DC

## 2018-02-05 MED ORDER — BUSPIRONE HCL 5 MG PO TABS: ORAL_TABLET | ORAL | 5 refills | Status: DC

## 2018-02-05 MED ORDER — BUDESONIDE 0.5 MG/2ML IN SUSP: 0.5000 mg | Freq: Two times a day (BID) | RESPIRATORY_TRACT | 6 refills | Status: DC

## 2018-02-05 MED ORDER — OXYCODONE-ACETAMINOPHEN 7.5-325 MG PO TABS: 1.0000 | ORAL_TABLET | Freq: Three times a day (TID) | ORAL | 0 refills | Status: DC | PRN

## 2018-02-05 MED ORDER — LEVOTHYROXINE SODIUM 75 MCG PO TABS: ORAL_TABLET | ORAL | 5 refills | Status: DC

## 2018-02-05 MED ORDER — ONDANSETRON 8 MG PO TBDP: 8.0000 mg | ORAL_TABLET | Freq: Three times a day (TID) | ORAL | 5 refills | Status: DC | PRN

## 2018-02-05 MED ORDER — MIDODRINE HCL 5 MG PO TABS: ORAL_TABLET | ORAL | 4 refills | Status: DC

## 2018-02-05 MED ORDER — ALBUTEROL SULFATE (2.5 MG/3ML) 0.083% IN NEBU: 2.5000 mg | INHALATION_SOLUTION | RESPIRATORY_TRACT | 12 refills | Status: DC | PRN

## 2018-02-05 MED ORDER — SERTRALINE HCL 50 MG PO TABS: 50.0000 mg | ORAL_TABLET | Freq: Every day | ORAL | 5 refills | Status: DC

## 2018-02-05 MED FILL — BENZONATATE 100 MG CAP: 100 | 10 days supply | Qty: 30 | Fill #0

## 2018-02-05 MED FILL — SERTRALINE HCL 50 MG TABS: 50 | 30 days supply | Qty: 30 | Fill #0

## 2018-02-05 MED FILL — ONDANSETRON ODT 8 MG TABLET: 8 | 4 days supply | Qty: 12 | Fill #0

## 2018-02-05 MED FILL — busPIRone HCL 5 MG TABS: 5 | 10 days supply | Qty: 30 | Fill #0

## 2018-02-05 NOTE — Progress Notes (Signed)
Subjective:    Patient ID: Kathleen [name], female    DOB: Apr 28, 1961, 57 y.o.   MRN: [id number]  HPI The patient comes in today for a wellness visit. No breast or pelvic exam   A review of their health history was completed.  A review of medications was also completed.  Any needed refills; Zofran  Eating habits: Eating ok  Falls/  MVA accidents in past few months: none  Regular exercise: not a lot, has bad hip  Specialist pt sees on regular basis: Lung specialist  Preventative health issues were discussed.   Additional concerns: Pt states she is having burning while urinating, about a day or two. Is unable to void at this time due to not bringing her cath.    This patient was seen today for chronic pain  The medication list was reviewed and updated.   -Compliance with medication: She relates compliance  - Number patient states they take daily: She takes 3 daily  -when was the last dose patient took?  Earlier today  The patient was advised the importance of maintaining medication and not using illegal substances with these.  Here for refills and follow up  The patient was educated that we can provide 3 monthly scripts for their medication, it is their responsibility to follow the instructions.  Side effects or complications from medications: Denies side effects  Patient is aware that pain medications are meant to minimize the severity of the pain to allow their pain levels to improve to allow for better function. They are aware of that pain medications cannot totally remove their pain.  Due for UDT ( at least once per year) : Later this summer       Review of Systems  Constitutional: Negative for activity change, appetite change and fatigue.  HENT: Negative for congestion and rhinorrhea.   Eyes: Negative for discharge.  Respiratory: Negative for cough, chest tightness and wheezing.   Cardiovascular: Negative for chest pain.  Gastrointestinal: Negative for  abdominal pain, blood in stool and vomiting.  Endocrine: Negative for polyphagia.  Genitourinary: Negative for difficulty urinating and frequency.  Musculoskeletal: Negative for neck pain.  Skin: Negative for color change.  Allergic/Immunologic: Negative for environmental allergies and food allergies.  Neurological: Negative for weakness and headaches.  Psychiatric/Behavioral: Positive for dysphoric mood. Negative for agitation and behavioral problems.       Objective:   Physical Exam  Constitutional: She is oriented to person, place, and time. She appears well-developed and well-nourished.  HENT:  Head: Normocephalic.  Right Ear: External ear normal.  Left Ear: External ear normal.  Eyes: Pupils are equal, round, and reactive to light.  Neck: Normal range of motion. No thyromegaly present.  Cardiovascular: Normal rate, regular rhythm, normal heart sounds and intact distal pulses.  No murmur heard. Pulmonary/Chest: Effort normal and breath sounds normal. No respiratory distress. She has no wheezes.  Abdominal: Soft. Bowel sounds are normal. She exhibits no distension and no mass. There is no tenderness.  Musculoskeletal: Normal range of motion. She exhibits no edema or tenderness.  Lymphadenopathy:    She has no cervical adenopathy.  Neurological: She is alert and oriented to person, place, and time. She exhibits normal muscle tone.  Skin: Skin is warm and dry.  Psychiatric: She has a normal mood and affect. Her behavior is normal.   Patient defers on breast exam pelvic exam states she gets this her gynecologist  Patient relates she is up-to-date on mammogram  Assessment & Plan:  Adult wellness-complete.wellness physical was conducted today. Importance of diet and exercise were discussed in detail.  In addition to this a discussion regarding safety was also covered. We also reviewed over immunizations and gave recommendations regarding current immunization needed for age.    In addition to this additional areas were also touched on including: Preventative health exams needed:  Colonoscopy 2024  Patient was advised yearly wellness exam   The patient was seen in followup for chronic pain. A review over at their current pain status was discussed. Drug registry was checked. Prescriptions were given. Discussion was held regarding the importance of compliance with medication as well as pain medication contract.  Time for questions regarding pain management plan occurred. Importance of regular followup visits was discussed. Patient was informed that medication may cause drowsiness and should not be combined  with other medications/alcohol or street drugs. Patient was cautioned that medication could cause drowsiness. If the patient feels medication is causing altered alertness then do not drive or operate dangerous equipment.  Drug registry checked 3 prescriptions given  Screening hepatitis C antibody  Lab work indicated for kidney function and cholesterol  Avascular necrosis of the left hip patient is holding off on surgery currently using pain medicine  Mild depression patient agrees to trying Zoloft will follow-up in a few months follow-up sooner problems patient not suicidal  Patient will submit urine later this week

## 2018-02-07 ENCOUNTER — Ambulatory Visit: Payer: 59 | Admitting: Family Medicine

## 2018-02-07 ENCOUNTER — Other Ambulatory Visit: Payer: Self-pay

## 2018-02-07 ENCOUNTER — Telehealth: Payer: Self-pay | Admitting: Family Medicine

## 2018-02-07 ENCOUNTER — Other Ambulatory Visit: Payer: Self-pay | Admitting: *Deleted

## 2018-02-07 DIAGNOSIS — R300 Dysuria: Secondary | ICD-10-CM | POA: Diagnosis not present

## 2018-02-07 MED ORDER — CEFPROZIL 500 MG PO TABS: 500.0000 mg | ORAL_TABLET | Freq: Two times a day (BID) | ORAL | 0 refills | Status: DC

## 2018-02-07 MED FILL — CEFPROZIL 500 MG TABLET: 500 | 5 days supply | Qty: 10 | Fill #0

## 2018-02-07 NOTE — Telephone Encounter (Signed)
Discussed with pt. Med sent to [facility] per pt request. Pt states she does not want it to go to a local pharm. Advised pt she should start on antibiotic this weekend but she still wanted to go to [facility] to pickup on Monday. Med sent to [facility].

## 2018-02-07 NOTE — Telephone Encounter (Signed)
cefzil 500 one bid for 5 days for UTI, please send culture, follow up if problems

## 2018-02-07 NOTE — Telephone Encounter (Signed)
A reasonable attempt was made to get the patient to start on it now she deferred medicine until Monday

## 2018-02-07 NOTE — Telephone Encounter (Signed)
Pts husband was seen today for office visit. Husband brought in urine sample for patient. Pt was seen on 02/05/18 for well adult exam and stated she was having UTI symptoms

## 2018-02-10 LAB — URINE CULTURE

## 2018-02-10 LAB — SPECIMEN STATUS REPORT

## 2018-02-16 DIAGNOSIS — J849 Interstitial pulmonary disease, unspecified: Secondary | ICD-10-CM | POA: Diagnosis not present

## 2018-02-16 DIAGNOSIS — J9601 Acute respiratory failure with hypoxia: Secondary | ICD-10-CM | POA: Diagnosis not present

## 2018-02-16 DIAGNOSIS — D860 Sarcoidosis of lung: Secondary | ICD-10-CM | POA: Diagnosis not present

## 2018-02-16 DIAGNOSIS — M6281 Muscle weakness (generalized): Secondary | ICD-10-CM | POA: Diagnosis not present

## 2018-02-18 ENCOUNTER — Ambulatory Visit (INDEPENDENT_AMBULATORY_CARE_PROVIDER_SITE_OTHER): Payer: 59 | Admitting: Family Medicine

## 2018-02-18 ENCOUNTER — Encounter: Payer: Self-pay | Admitting: Family Medicine

## 2018-02-18 VITALS — BP 130/80 | Temp 97.9°F | Ht 70.0 in | Wt 222.0 lb

## 2018-02-18 DIAGNOSIS — A09 Infectious gastroenteritis and colitis, unspecified: Secondary | ICD-10-CM

## 2018-02-18 NOTE — Progress Notes (Signed)
   Subjective:    Patient ID: Kathleen [name], female    DOB: 11-Oct-1960, 57 y.o.   MRN: [id number]  HPI Patient is here today with complaints of diarrhea for a week now. She is not taking anything for it. Severe diarrhea issues Not having any major setbacks No high fevers chills sweats or bloody stools [place] at city like [place] have had problems with contaminated water and to have band the use of water for cooking and drinking Patient does take immunosuppressants  Review of Systems    Please see above negative for vomiting positive for nausea positive for abdominal cramps and diarrhea negative for bloody stools negative for high fevers or chills Objective:   Physical Exam  Lungs clear respiratory rate normal heart is regular no murmurs pulses normal abdomen is soft  Warning signs discussed including high fevers bloody stools vomiting or worse come here or go to ER if worse    Assessment & Plan:  Significant diarrhea issues Probably related to exposure to coliform/E. coli Stool cultures stool O&P plus C. difficile PCR ordered no antibiotics indicated currently use Kaopectate or Pepto-Bismol

## 2018-02-20 DIAGNOSIS — E785 Hyperlipidemia, unspecified: Secondary | ICD-10-CM | POA: Diagnosis not present

## 2018-02-20 DIAGNOSIS — Z0000 Encounter for general adult medical examination without abnormal findings: Secondary | ICD-10-CM | POA: Diagnosis not present

## 2018-02-20 DIAGNOSIS — Z1159 Encounter for screening for other viral diseases: Secondary | ICD-10-CM | POA: Diagnosis not present

## 2018-02-21 LAB — LIPID PANEL
Chol/HDL Ratio: 5 ratio — ABNORMAL HIGH (ref 0.0–4.4)
Cholesterol, Total: 259 mg/dL — ABNORMAL HIGH (ref 100–199)
HDL: 52 mg/dL (ref >39)
LDL Calculated: 131 mg/dL — ABNORMAL HIGH (ref 0–99)
Triglycerides: 380 mg/dL — ABNORMAL HIGH (ref 0–149)
VLDL Cholesterol Cal: 76 mg/dL — ABNORMAL HIGH (ref 5–40)

## 2018-02-21 LAB — BASIC METABOLIC PANEL
BUN/Creatinine Ratio: 14 (ref 9–23)
BUN: 13 mg/dL (ref 6–24)
CO2: 23 mmol/L (ref 20–29)
Calcium: 10.3 mg/dL — ABNORMAL HIGH (ref 8.7–10.2)
Chloride: 100 mmol/L (ref 96–106)
Creatinine, Ser: 0.91 mg/dL (ref 0.57–1.00)
GFR calc Af Amer: 82 mL/min/{1.73_m2} (ref >59)
GFR calc non Af Amer: 71 mL/min/{1.73_m2} (ref >59)
Glucose: 88 mg/dL (ref 65–99)
Potassium: 4.7 mmol/L (ref 3.5–5.2)
Sodium: 138 mmol/L (ref 134–144)

## 2018-02-21 LAB — HEPATITIS C ANTIBODY: Hep C Virus Ab: <0.1 s/co ratio (ref 0.0–0.9)

## 2018-02-21 MED FILL — OXYCODONE/APAP 7.5/325MG: 7.5-325 | 30 days supply | Qty: 90 | Fill #0

## 2018-02-24 DIAGNOSIS — R0609 Other forms of dyspnea: Secondary | ICD-10-CM | POA: Diagnosis not present

## 2018-02-24 DIAGNOSIS — R0602 Shortness of breath: Secondary | ICD-10-CM | POA: Diagnosis not present

## 2018-02-24 DIAGNOSIS — J849 Interstitial pulmonary disease, unspecified: Secondary | ICD-10-CM | POA: Diagnosis not present

## 2018-02-26 ENCOUNTER — Other Ambulatory Visit: Payer: Self-pay | Admitting: Family Medicine

## 2018-02-26 DIAGNOSIS — E038 Other specified hypothyroidism: Secondary | ICD-10-CM

## 2018-03-05 ENCOUNTER — Telehealth: Payer: Self-pay | Admitting: Family Medicine

## 2018-03-05 MED FILL — predniSONE 5 MG TABS: 5 | 90 days supply | Qty: 90 | Fill #0

## 2018-03-05 MED FILL — LEVOTHYROXINE 75 MCG TABLET: 75 | 30 days supply | Qty: 30 | Fill #4

## 2018-03-05 NOTE — Telephone Encounter (Signed)
Whoa that s a substantial risk benefit long term medicine. How long has pt been taking pred every day of her life? Is she certain that has been uncder direction of dr scott? Why calling now, [employer]?

## 2018-03-05 NOTE — Telephone Encounter (Signed)
Patient is requesting a refill on her prednisone.  She is completely out.  [name]

## 2018-03-05 NOTE — Telephone Encounter (Signed)
Last prescribed by Dr. Jerilee HohDeniece Ree. Pt states that must of been someone in the hospital. She states dr Nicki Reaper has prescribed in the past. She takes 10mg  one daily for her lung disease.

## 2018-03-05 NOTE — Telephone Encounter (Signed)
good

## 2018-03-05 NOTE — Telephone Encounter (Signed)
Patient has a pulmonologist Dr Farley Ly and stated she will call their office for a refill of her chronic daily prednisone

## 2018-03-12 DIAGNOSIS — R0602 Shortness of breath: Secondary | ICD-10-CM | POA: Diagnosis not present

## 2018-03-12 DIAGNOSIS — J849 Interstitial pulmonary disease, unspecified: Secondary | ICD-10-CM | POA: Diagnosis not present

## 2018-03-12 DIAGNOSIS — D860 Sarcoidosis of lung: Secondary | ICD-10-CM | POA: Diagnosis not present

## 2018-03-18 DIAGNOSIS — J849 Interstitial pulmonary disease, unspecified: Secondary | ICD-10-CM | POA: Diagnosis not present

## 2018-03-18 DIAGNOSIS — M6281 Muscle weakness (generalized): Secondary | ICD-10-CM | POA: Diagnosis not present

## 2018-03-18 DIAGNOSIS — J9601 Acute respiratory failure with hypoxia: Secondary | ICD-10-CM | POA: Diagnosis not present

## 2018-03-18 DIAGNOSIS — D860 Sarcoidosis of lung: Secondary | ICD-10-CM | POA: Diagnosis not present

## 2018-03-24 MED FILL — OXYCODONE/APAP 7.5/325MG: 7.5-325 | 30 days supply | Qty: 90 | Fill #0

## 2018-04-11 MED FILL — ONDANSETRON ODT 8 MG TABLET: 8 | 4 days supply | Qty: 12 | Fill #1

## 2018-04-11 MED FILL — MIDODRINE HCL 5 MG TABLET: 5 | 30 days supply | Qty: 60 | Fill #1

## 2018-04-11 MED FILL — LEVOTHYROXINE 75 MCG TABLET: 75 | 30 days supply | Qty: 30 | Fill #5

## 2018-04-15 ENCOUNTER — Ambulatory Visit (INDEPENDENT_AMBULATORY_CARE_PROVIDER_SITE_OTHER): Payer: 59 | Admitting: Family Medicine

## 2018-04-15 ENCOUNTER — Encounter: Payer: Self-pay | Admitting: Family Medicine

## 2018-04-15 VITALS — BP 122/74 | Temp 97.4°F | Ht 70.0 in | Wt 213.0 lb

## 2018-04-15 DIAGNOSIS — M87052 Idiopathic aseptic necrosis of left femur: Secondary | ICD-10-CM

## 2018-04-15 DIAGNOSIS — I959 Hypotension, unspecified: Secondary | ICD-10-CM

## 2018-04-15 DIAGNOSIS — G894 Chronic pain syndrome: Secondary | ICD-10-CM

## 2018-04-15 DIAGNOSIS — R112 Nausea with vomiting, unspecified: Secondary | ICD-10-CM

## 2018-04-15 DIAGNOSIS — E038 Other specified hypothyroidism: Secondary | ICD-10-CM | POA: Diagnosis not present

## 2018-04-15 DIAGNOSIS — E2740 Unspecified adrenocortical insufficiency: Secondary | ICD-10-CM

## 2018-04-15 MED ORDER — OXYCODONE-ACETAMINOPHEN 7.5-325 MG PO TABS: 1.0000 | ORAL_TABLET | Freq: Three times a day (TID) | ORAL | 0 refills | Status: DC | PRN

## 2018-04-15 MED ORDER — PROMETHAZINE HCL 12.5 MG PO TABS: 12.5000 mg | ORAL_TABLET | Freq: Three times a day (TID) | ORAL | 3 refills | Status: DC | PRN

## 2018-04-15 MED FILL — PROMETHAZINE 12.5 MG TABLET: 12.5 | 5 days supply | Qty: 15 | Fill #0

## 2018-04-15 NOTE — Progress Notes (Signed)
Subjective:    Patient ID: Kathleen [name], female    DOB: 12-22-1960, 57 y.o.   MRN: [id number]  HPIpt arrives today to discuss midodrine being discontinued.  This patient relates that the medication is no longer available [facility] told her it is no longer available She relates her blood pressure stays on the low end   Pt also having nausea and diarrhea for 2 days.  She relates intermittent nausea vomiting as well as some diarrhea over the past couple days denies any high fever chills sweats states she does feel somewhat drained.  She does not know what caused this but she has this intermittently she denies any abdominal pain with it  She does have adrenal insufficiency for which she takes prednisone on a daily basis she states that she has not missed any dose  Patient has had low calcium in the past we will need to recheck this to see if this could be playing a role in her symptoms  Patient has avascular necrosis of the left hip causes significant pain discomfort because it has has chronic pain syndrome takes her pain medicine on a regular basis it does seem to help she denies abusing the medication and the pain registry was checked  She does have hypothyroidism she takes her medicine on a regular basis and denies missing the medicine the medication   Review of Systems  Constitutional: Negative for activity change, fatigue and fever.  HENT: Negative for congestion and rhinorrhea.   Respiratory: Negative for cough, chest tightness and shortness of breath.   Cardiovascular: Negative for chest pain and leg swelling.  Gastrointestinal: Negative for abdominal pain and nausea.  Skin: Negative for color change.  Neurological: Negative for dizziness and headaches.  Psychiatric/Behavioral: Negative for agitation and behavioral problems.       Objective:   Physical Exam  Constitutional: She appears well-nourished. No distress.  HENT:  Head: Normocephalic and atraumatic.    Eyes: Right eye exhibits no discharge. Left eye exhibits no discharge.  Neck: No tracheal deviation present.  Cardiovascular: Normal rate, regular rhythm and normal heart sounds.  No murmur heard. Pulmonary/Chest: Effort normal and breath sounds normal. No respiratory distress.  Musculoskeletal: She exhibits no edema.  Lymphadenopathy:    She has no cervical adenopathy.  Neurological: She is alert. Coordination normal.  Skin: Skin is warm and dry.  Psychiatric: She has a normal mood and affect. Her behavior is normal.  Vitals reviewed.         Assessment & Plan:  Significant nausea vomiting a lot of times this patient gets this intermittently I recommend nausea medicine Zofran and Phenergan if she has ongoing trouble she is let us know  Adrenal insufficiency continue her prednisone I do not feel she is in adrenal crisis currently  Hypothyroidism check lab work continue medications  Patient is to follow-up with Dr. Dorris Fetch on every six-month basis  Blood pressure is on the low end of normal but she states that the medication she is currently on is no longer going to be continued so therefore we need to see if something different can be used we will discuss this with Dr. Dorris Fetch  Avascular necrosis of the left hip currently right now specialist says she does not need surgery but possibly down the road she will pain medications were given 3 prescriptions drug registry was checked patient is responsible with her medicine does not abuse it  Hypocalcemia she will recheck lab work  Patient will follow-up  in 3 months  Patient to go to ER if worse

## 2018-04-16 ENCOUNTER — Telehealth: Payer: Self-pay | Admitting: Family Medicine

## 2018-04-16 NOTE — Telephone Encounter (Signed)
Patient was seen yesterday and needing something for constipation called in also to [facility]

## 2018-04-17 DIAGNOSIS — G894 Chronic pain syndrome: Secondary | ICD-10-CM | POA: Diagnosis not present

## 2018-04-17 DIAGNOSIS — E2740 Unspecified adrenocortical insufficiency: Secondary | ICD-10-CM | POA: Diagnosis not present

## 2018-04-17 DIAGNOSIS — R112 Nausea with vomiting, unspecified: Secondary | ICD-10-CM | POA: Diagnosis not present

## 2018-04-17 DIAGNOSIS — I959 Hypotension, unspecified: Secondary | ICD-10-CM | POA: Diagnosis not present

## 2018-04-17 DIAGNOSIS — M87052 Idiopathic aseptic necrosis of left femur: Secondary | ICD-10-CM | POA: Diagnosis not present

## 2018-04-17 DIAGNOSIS — E038 Other specified hypothyroidism: Secondary | ICD-10-CM | POA: Diagnosis not present

## 2018-04-17 MED ORDER — LUBIPROSTONE 24 MCG PO CAPS: 24.0000 ug | ORAL_CAPSULE | Freq: Two times a day (BID) | ORAL | 5 refills | Status: DC

## 2018-04-17 NOTE — Telephone Encounter (Signed)
Patient is aware 

## 2018-04-17 NOTE — Telephone Encounter (Signed)
Medication sent to [facility]

## 2018-04-17 NOTE — Telephone Encounter (Signed)
It would be reasonable to try Amitiza 24 mcg 1 twice daily #60 with 5 refills Indication opioid induced constipation Send the medication to the pharmacy Let the patient know Make the patient aware that it may require prior approval which could cause it to take a little while longer to get approved There are other medicines similar that might be recommended by her insurance company obviously this will be found out over time If any troubles or problems patient will need to call and follow-up

## 2018-04-18 DIAGNOSIS — J849 Interstitial pulmonary disease, unspecified: Secondary | ICD-10-CM | POA: Diagnosis not present

## 2018-04-18 DIAGNOSIS — M6281 Muscle weakness (generalized): Secondary | ICD-10-CM | POA: Diagnosis not present

## 2018-04-18 DIAGNOSIS — J9601 Acute respiratory failure with hypoxia: Secondary | ICD-10-CM | POA: Diagnosis not present

## 2018-04-18 DIAGNOSIS — D860 Sarcoidosis of lung: Secondary | ICD-10-CM | POA: Diagnosis not present

## 2018-04-18 LAB — BASIC METABOLIC PANEL
BUN/Creatinine Ratio: 13 (ref 9–23)
BUN: 11 mg/dL (ref 6–24)
CO2: 23 mmol/L (ref 20–29)
Calcium: 9.7 mg/dL (ref 8.7–10.2)
Chloride: 105 mmol/L (ref 96–106)
Creatinine, Ser: 0.87 mg/dL (ref 0.57–1.00)
GFR calc Af Amer: 87 mL/min/{1.73_m2} (ref >59)
GFR calc non Af Amer: 75 mL/min/{1.73_m2} (ref >59)
Glucose: 83 mg/dL (ref 65–99)
Potassium: 4.8 mmol/L (ref 3.5–5.2)
Sodium: 142 mmol/L (ref 134–144)

## 2018-04-18 LAB — TSH: TSH: 2.28 u[IU]/mL (ref 0.450–4.500)

## 2018-04-18 LAB — PARATHYROID HORMONE, INTACT (NO CA): PTH: 25 pg/mL (ref 15–65)

## 2018-04-22 ENCOUNTER — Telehealth: Payer: Self-pay

## 2018-04-22 MED FILL — AMITIZA 24 MCG CAPSULES: 24 | 30 days supply | Qty: 60 | Fill #0

## 2018-04-22 NOTE — Telephone Encounter (Signed)
Amitiza 24 mcg approved per [employer]. I called [facility] and they ran it through insurance and it was approved. I called the pt no answer left a vm,asking her to return call so I can notify her the medication is ready for pick up.

## 2018-04-23 ENCOUNTER — Telehealth: Payer: Self-pay | Admitting: Family Medicine

## 2018-04-23 NOTE — Telephone Encounter (Signed)
Will send note to Dr Dorris Fetch for his input  Dr Dorris Fetch- mutual patient with [place] and low BP. [facility] telling her that midodrine no longer available Should this just be stopped or should we put her on different med- such as Florinef? Thanks for your input- Sallee Lange [facility]

## 2018-04-23 NOTE — Telephone Encounter (Signed)
Nurses- I sent Dr Dorris Fetch a note regarding Midodrine- when I get a reply we can assist Ber[place] more - FYI- see previous meessages

## 2018-04-23 NOTE — Telephone Encounter (Signed)
ok 

## 2018-04-23 NOTE — Telephone Encounter (Signed)
Pt returned call. Informed patient that medication is ready to be picked up. Pt had question about blood pressure med that provider was going to ask Dr.Nidas opinion on. While patient was on phone nurse looked in chart but did not see anything. Informed patient that I would call her back when I found something or when the provider responded. Left message to return call to inform pt that provider is out of office and is this something that can wait until next week.

## 2018-04-23 NOTE — Telephone Encounter (Signed)
Pt contacted office. Pt states she cut her finger while cleaning out her knife drawer. Pt wanted to come in to get the finger stitched. Informed patient that we have no availability and that there was only one doctor here until next week. Pt advised to go to ED to get finger stitched or at least looked at. Pt verbalized understanding.

## 2018-04-23 NOTE — Telephone Encounter (Signed)
Pt has not returned call regarding this. Will send to provider to look into

## 2018-04-24 NOTE — Telephone Encounter (Signed)
Contacted patient to let her know that a note was sent to Dr.Nida and when we get a reply, we will contact her. Pt verbalized understanding.

## 2018-04-25 MED FILL — OXYCODONE/APAP 7.5/325MG: 7.5-325 | 30 days supply | Qty: 90 | Fill #0

## 2018-04-25 NOTE — Telephone Encounter (Signed)
Nurses- tell patient I consulted with Dr Dorris Fetch She may come off Midrodrine She will need a follow up within the next 4 weeks for BP and orthostatics Follow up sooner if low BP There is another medicine we can use if BP become low (florinef 0.1)

## 2018-04-25 NOTE — Telephone Encounter (Signed)
Thanks Event organiser, Now that she has adequate glucocorticoid replacement and stable blood pressure for some time, she can be taken off of Midodrin (she was on relatively low dose anyway).  She will however need close follow-up.  If she becomes symptomatic of orthostatic hypotension, as you correctly planned, can be considered for Florinef at a dose of 0.1 mg p.o. Daily for mineralocorticoid replacement. Tawania Daponte

## 2018-04-25 NOTE — Telephone Encounter (Signed)
Patient advised per Dr Nicki Reaper: Dr Nicki Reaper consulted with Dr Dorris Fetch She may come off Midrodrine She will need a follow up within the next 4 weeks for BP and orthostatics Follow up sooner if low BP. There is another medicine we can use if BP become low. Patient verbalized understanding but stated she is not comfortable coming off medications for her low blood pressure. Patient states she fears she will pass out and hurt herself and is not comfortable with these recommendations. Please advise.

## 2018-04-30 NOTE — Telephone Encounter (Signed)
[facility]s have [facility] fill the medicine for her.,  Inform the patient we want her to stay with the medicine for now, I would asked that the patient schedule an office visit to follow-up in approximately 4 to 6 weeks so we can discuss options once this supply runs out

## 2018-04-30 NOTE — Telephone Encounter (Signed)
Patient stated she will get her medication filled at [facility] and follow up with Dr Nicki Reaper before she runs out of medication.

## 2018-04-30 NOTE — Telephone Encounter (Addendum)
Pharmacy states the med is on back order but [facility] has one bottle of 100 which is a 50 day supply they could fill for her.   [facility] (where she normally goes) is out and med on back order.

## 2018-04-30 NOTE — Telephone Encounter (Signed)
[facility] check with [facility]-the patient states this medication is no longer available?  Does that mean is just no longer available to [facility] or that it is no longer being produced?  Further decisions can be made after this information is gathered

## 2018-05-05 MED FILL — MIDODRINE HCL 5 MG TABLET: 5 | 48 days supply | Qty: 97 | Fill #2

## 2018-05-12 MED FILL — PANTOPRAZOLE SOD DR 40 MG T: 40 | 90 days supply | Qty: 90 | Fill #0

## 2018-05-12 MED FILL — LEVOTHYROXINE 75 MCG TABLET: 75 | 30 days supply | Qty: 30 | Fill #6

## 2018-05-13 ENCOUNTER — Ambulatory Visit (INDEPENDENT_AMBULATORY_CARE_PROVIDER_SITE_OTHER): Payer: 59 | Admitting: Family Medicine

## 2018-05-13 ENCOUNTER — Encounter: Payer: Self-pay | Admitting: Family Medicine

## 2018-05-13 VITALS — BP 118/78 | Temp 97.5°F | Ht 70.0 in | Wt 223.0 lb

## 2018-05-13 DIAGNOSIS — L0291 Cutaneous abscess, unspecified: Secondary | ICD-10-CM

## 2018-05-13 DIAGNOSIS — B349 Viral infection, unspecified: Secondary | ICD-10-CM | POA: Diagnosis not present

## 2018-05-13 MED ORDER — DOXYCYCLINE HYCLATE 100 MG PO TABS: 100.0000 mg | ORAL_TABLET | Freq: Two times a day (BID) | ORAL | 0 refills | Status: DC

## 2018-05-13 MED FILL — DOXYCYCLINE HYCLATE 100 MG: 100 | 10 days supply | Qty: 20 | Fill #0

## 2018-05-13 MED FILL — ONDANSETRON ODT 8 MG TABLET: 8 | 4 days supply | Qty: 12 | Fill #2

## 2018-05-13 NOTE — Progress Notes (Addendum)
   Subjective:    Patient ID: Kathleen [name], female    DOB: [birth year], 57 y.o.   MRN: [id number]  HPI  Patient is here today with chest and nasal congestion.She also have bump under her right arm.  Patient relates couple days of head congestion drainage body aches does not feel good some nausea no vomiting no diarrhea PMH has underlying immune deficiency issues because of her treatment for chronic lung She also has an abscess in the right upper axilla region that has been present over the past few days seemingly getting bigger or painful and discomforting  Review of Systems  Constitutional: Positive for chills and fatigue. Negative for activity change and fever.  HENT: Positive for congestion and rhinorrhea. Negative for ear pain.   Eyes: Negative for discharge.  Respiratory: Positive for cough. Negative for shortness of breath and wheezing.   Cardiovascular: Negative for chest pain.  Gastrointestinal: Positive for nausea.       Objective:   Physical Exam  Constitutional: She appears well-developed.  HENT:  Head: Normocephalic.  Nose: Nose normal.  Mouth/Throat: Oropharynx is clear and moist. No oropharyngeal exudate.  Neck: Neck supple.  Cardiovascular: Normal rate and normal heart sounds.  No murmur heard. Pulmonary/Chest: Effort normal and breath sounds normal. She has no wheezes.  Lymphadenopathy:    She has no cervical adenopathy.  Skin: Skin is warm and dry.  Nursing note and vitals reviewed.   Abscess is noted in the right upper arm region.      Assessment & Plan:  Viral URI should gradually get better warning signs discussed follow-up if progressive troubles or worse  Abscess- under sterile technique 1% lidocaine was injected into the area #11 blade was used after prepping the area with Betadine mild amount of pus was obtained Area was cleansed bandage was placed It was not necessary to put a packing in this Warm compresses frequently Follow-up if progressive  troubles  Doxycycline 100 mg twice daily for the next 10 days follow-up if ongoing troubles warnings were discussed

## 2018-05-19 DIAGNOSIS — J849 Interstitial pulmonary disease, unspecified: Secondary | ICD-10-CM | POA: Diagnosis not present

## 2018-05-19 DIAGNOSIS — J9601 Acute respiratory failure with hypoxia: Secondary | ICD-10-CM | POA: Diagnosis not present

## 2018-05-19 DIAGNOSIS — D860 Sarcoidosis of lung: Secondary | ICD-10-CM | POA: Diagnosis not present

## 2018-05-19 DIAGNOSIS — M6281 Muscle weakness (generalized): Secondary | ICD-10-CM | POA: Diagnosis not present

## 2018-05-23 ENCOUNTER — Telehealth: Payer: Self-pay | Admitting: Family Medicine

## 2018-05-23 ENCOUNTER — Other Ambulatory Visit: Payer: Self-pay

## 2018-05-23 MED ORDER — DOXYCYCLINE HYCLATE 100 MG PO TABS: ORAL_TABLET | ORAL | 0 refills | Status: DC

## 2018-05-23 NOTE — Telephone Encounter (Signed)
Med sent in and patient is aware  

## 2018-05-23 NOTE — Telephone Encounter (Signed)
Pt is requesting another round of antibiotics be called in she was seen on 05/13/18 and everything hasnt cleared up yet. Please send to [place], [place]

## 2018-05-23 NOTE — Telephone Encounter (Signed)
Please refill doxycycline 100 mg 1 twice daily 10 days

## 2018-05-23 NOTE — Telephone Encounter (Signed)
Please advise 

## 2018-06-05 MED FILL — predniSONE 5 MG TABS: 5 | 90 days supply | Qty: 90 | Fill #1

## 2018-06-18 DIAGNOSIS — M6281 Muscle weakness (generalized): Secondary | ICD-10-CM | POA: Diagnosis not present

## 2018-06-18 DIAGNOSIS — J849 Interstitial pulmonary disease, unspecified: Secondary | ICD-10-CM | POA: Diagnosis not present

## 2018-06-18 DIAGNOSIS — D860 Sarcoidosis of lung: Secondary | ICD-10-CM | POA: Diagnosis not present

## 2018-06-18 DIAGNOSIS — J9601 Acute respiratory failure with hypoxia: Secondary | ICD-10-CM | POA: Diagnosis not present

## 2018-06-22 MED FILL — OXYCODONE/APAP 7.5/325MG: 7.5-325 | 30 days supply | Qty: 90 | Fill #0

## 2018-06-23 ENCOUNTER — Other Ambulatory Visit: Payer: Self-pay | Admitting: "Endocrinology

## 2018-06-23 MED FILL — ONDANSETRON ODT 8 MG TABLET: 8 | 4 days supply | Qty: 12 | Fill #3

## 2018-06-24 MED FILL — LEVOTHYROXINE 75 MCG TABLET: 75 | 30 days supply | Qty: 30 | Fill #0

## 2018-07-01 DIAGNOSIS — J849 Interstitial pulmonary disease, unspecified: Secondary | ICD-10-CM | POA: Diagnosis not present

## 2018-07-01 DIAGNOSIS — R0600 Dyspnea, unspecified: Secondary | ICD-10-CM | POA: Diagnosis not present

## 2018-07-01 DIAGNOSIS — R0602 Shortness of breath: Secondary | ICD-10-CM | POA: Diagnosis not present

## 2018-07-02 DIAGNOSIS — R0602 Shortness of breath: Secondary | ICD-10-CM | POA: Diagnosis not present

## 2018-07-02 DIAGNOSIS — D860 Sarcoidosis of lung: Secondary | ICD-10-CM | POA: Diagnosis not present

## 2018-07-02 MED FILL — AZITHROMYCIN 250 MG TABLET: 250 | 5 days supply | Qty: 10 | Fill #0

## 2018-07-09 DIAGNOSIS — D860 Sarcoidosis of lung: Secondary | ICD-10-CM | POA: Diagnosis not present

## 2018-07-09 DIAGNOSIS — R0602 Shortness of breath: Secondary | ICD-10-CM | POA: Diagnosis not present

## 2018-07-09 MED FILL — MIDODRINE HCL 5 MG TABLET: 5 | 30 days supply | Qty: 60 | Fill #3

## 2018-07-09 MED FILL — PROMETHAZINE 12.5 MG TABLET: 12.5 | 5 days supply | Qty: 15 | Fill #1

## 2018-07-09 MED FILL — ONDANSETRON ODT 8 MG TABLET: 8 | 4 days supply | Qty: 12 | Fill #4

## 2018-07-19 DIAGNOSIS — D860 Sarcoidosis of lung: Secondary | ICD-10-CM | POA: Diagnosis not present

## 2018-07-19 DIAGNOSIS — M6281 Muscle weakness (generalized): Secondary | ICD-10-CM | POA: Diagnosis not present

## 2018-07-19 DIAGNOSIS — J9601 Acute respiratory failure with hypoxia: Secondary | ICD-10-CM | POA: Diagnosis not present

## 2018-07-19 DIAGNOSIS — J849 Interstitial pulmonary disease, unspecified: Secondary | ICD-10-CM | POA: Diagnosis not present

## 2018-07-22 DIAGNOSIS — I348 Other nonrheumatic mitral valve disorders: Secondary | ICD-10-CM | POA: Diagnosis not present

## 2018-07-22 DIAGNOSIS — R0602 Shortness of breath: Secondary | ICD-10-CM | POA: Diagnosis not present

## 2018-07-22 DIAGNOSIS — I517 Cardiomegaly: Secondary | ICD-10-CM | POA: Diagnosis not present

## 2018-07-22 MED FILL — OXYCODONE/APAP 7.5/325MG: 7.5-325 | 30 days supply | Qty: 90 | Fill #0

## 2018-07-25 MED FILL — ONDANSETRON ODT 8 MG TABLET: 8 | 4 days supply | Qty: 12 | Fill #5

## 2018-08-04 ENCOUNTER — Other Ambulatory Visit: Payer: Self-pay | Admitting: Family Medicine

## 2018-08-04 MED FILL — MIDODRINE HCL 5 MG TABLET: 5 | 30 days supply | Qty: 60 | Fill #0

## 2018-08-04 MED FILL — LEVOTHYROXINE 75 MCG TABLET: 75 | 30 days supply | Qty: 30 | Fill #1

## 2018-08-04 MED FILL — ONDANSETRON ODT 8 MG TABLET: 8 | 4 days supply | Qty: 12 | Fill #0

## 2018-08-07 ENCOUNTER — Other Ambulatory Visit: Payer: Self-pay | Admitting: Family Medicine

## 2018-08-07 ENCOUNTER — Encounter: Payer: Self-pay | Admitting: Family Medicine

## 2018-08-07 ENCOUNTER — Ambulatory Visit (INDEPENDENT_AMBULATORY_CARE_PROVIDER_SITE_OTHER): Payer: 59 | Admitting: Family Medicine

## 2018-08-07 VITALS — BP 112/72 | Temp 98.6°F | Ht 70.0 in | Wt 222.0 lb

## 2018-08-07 DIAGNOSIS — R6881 Early satiety: Secondary | ICD-10-CM | POA: Diagnosis not present

## 2018-08-07 DIAGNOSIS — E039 Hypothyroidism, unspecified: Secondary | ICD-10-CM

## 2018-08-07 DIAGNOSIS — R112 Nausea with vomiting, unspecified: Secondary | ICD-10-CM

## 2018-08-07 MED ORDER — ONDANSETRON 8 MG PO TBDP: ORAL_TABLET | ORAL | 5 refills | Status: DC

## 2018-08-07 MED FILL — ONDANSETRON ODT 8 MG TABLET: 8 | 10 days supply | Qty: 30 | Fill #0

## 2018-08-07 NOTE — Progress Notes (Signed)
Subjective:    Patient ID: Kathleen [name], female    DOB: [birth year], 57 y.o.   MRN: [id number]  HPI  Patient arrives today with complaints of nausea for the last week.She has vomited 3-4 times off and one for the last week also. She has taken Zofran and it sometimes helps and others it does not. Unable to eat much without nausea Sometimes vomits despite zofran Sometimes forceful No fevers No bloody stools  Feels better if she avoids food Can occur safter eating  Patient relates feeling like food just stops in her stomach she denies true abdominal pain she relates more is nausea vomiting throwing up soon after eating.  This patient was seen today for chronic pain  The medication list was reviewed and updated.   -Compliance with medication: Patient relates compliance  - Number patient states they take daily: Takes 3 daily  -when was the last dose patient took?  Earlier today  The patient was advised the importance of maintaining medication and not using illegal substances with these.  Here for refills and follow up  The patient was educated that we can provide 3 monthly scripts for their medication, it is their responsibility to follow the instructions.  Side effects or complications from medications: Denies side effects  Patient is aware that pain medications are meant to minimize the severity of the pain to allow their pain levels to improve to allow for better function. They are aware of that pain medications cannot totally remove their pain.  Due for UDT ( at least once per year) : On a yearly basis not due today  Patient relates that the 7.5 mg does not adequately help her discomfort she would like to go to the stronger strength.  We will shift over to the 10 mg Drug registry was checked       Review of Systems  Constitutional: Negative for activity change, fatigue and fever.  HENT: Negative for congestion and rhinorrhea.   Respiratory: Negative for cough, chest  tightness and shortness of breath.   Cardiovascular: Negative for chest pain and leg swelling.  Gastrointestinal: Positive for nausea. Negative for abdominal pain, blood in stool and constipation.  Skin: Negative for color change.  Neurological: Negative for dizziness and headaches.  Psychiatric/Behavioral: Negative for agitation and behavioral problems.       Objective:   Physical Exam Vitals signs reviewed.  Constitutional:      General: She is not in acute distress. HENT:     Head: Normocephalic and atraumatic.  Eyes:     General:        Right eye: No discharge.        Left eye: No discharge.  Neck:     Trachea: No tracheal deviation.  Cardiovascular:     Rate and Rhythm: Normal rate and regular rhythm.     Heart sounds: Normal heart sounds. No murmur.  Pulmonary:     Effort: Pulmonary effort is normal. No respiratory distress.     Breath sounds: Normal breath sounds.  Lymphadenopathy:     Cervical: No cervical adenopathy.  Skin:    General: Skin is warm and dry.  Neurological:     Mental Status: She is alert.     Coordination: Coordination normal.  Psychiatric:        Behavior: Behavior normal.           Assessment & Plan:  The patient was seen in followup for chronic pain. A review over at their current  pain status was discussed. Drug registry was checked. Prescriptions were given. Discussion was held regarding the importance of compliance with medication as well as pain medication contract.  Time for questions regarding pain management plan occurred. Importance of regular followup visits was discussed. Patient was informed that medication may cause drowsiness and should not be combined  with other medications/alcohol or street drugs. Patient was cautioned that medication could cause drowsiness. If the patient feels medication is causing altered alertness then do not drive or operate dangerous equipment.  Drug registry checked We will go ahead and increase  to the 10 mg/325 taken 3 times daily  Early satiety along with nausea and vomiting after eating need to rule out the possibility of gastroparesis versus gallbladder dysfunction start off with ultrasound and lab work. May need HIDA May need gastric emptying study May need gastroenterology consultation with EGD hold off on these currently Zofran as needed for nausea Phenergan when necessary caution drowsiness  25 minutes was spent with the patient.  This statement verifies that 25 minutes was indeed spent with the patient.  More than 50% of this visit-total duration of the visit-was spent in counseling and coordination of care. The issues that the patient came in for today as reflected in the diagnosis (s) please refer to documentation for further details.  Patient also with hypothyroidism takes her medicine regular basis check lab work await the results

## 2018-08-08 LAB — BASIC METABOLIC PANEL
BUN/Creatinine Ratio: 9 (ref 9–23)
BUN: 9 mg/dL (ref 6–24)
CO2: 24 mmol/L (ref 20–29)
Calcium: 10 mg/dL (ref 8.7–10.2)
Chloride: 101 mmol/L (ref 96–106)
Creatinine, Ser: 1.05 mg/dL — ABNORMAL HIGH (ref 0.57–1.00)
GFR calc Af Amer: 69 mL/min/{1.73_m2} (ref >59)
GFR calc non Af Amer: 60 mL/min/{1.73_m2} (ref >59)
Glucose: 77 mg/dL (ref 65–99)
Potassium: 4.6 mmol/L (ref 3.5–5.2)
Sodium: 141 mmol/L (ref 134–144)

## 2018-08-08 LAB — CBC WITH DIFFERENTIAL/PLATELET
Basophils Absolute: 0.1 10*3/uL (ref 0.0–0.2)
Basos: 1 %
EOS (ABSOLUTE): 0.3 10*3/uL (ref 0.0–0.4)
Eos: 4 %
Hematocrit: 36.9 % (ref 34.0–46.6)
Hemoglobin: 12.6 g/dL (ref 11.1–15.9)
Immature Grans (Abs): 0.1 10*3/uL (ref 0.0–0.1)
Immature Granulocytes: 1 %
Lymphocytes Absolute: 0.9 10*3/uL (ref 0.7–3.1)
Lymphs: 10 %
MCH: 29.2 pg (ref 26.6–33.0)
MCHC: 34.1 g/dL (ref 31.5–35.7)
MCV: 86 fL (ref 79–97)
Monocytes Absolute: 0.9 10*3/uL (ref 0.1–0.9)
Monocytes: 11 %
Neutrophils Absolute: 6.1 10*3/uL (ref 1.4–7.0)
Neutrophils: 73 %
Platelets: 359 10*3/uL (ref 150–450)
RBC: 4.31 x10E6/uL (ref 3.77–5.28)
RDW: 14.1 % (ref 12.3–15.4)
WBC: 8.3 10*3/uL (ref 3.4–10.8)

## 2018-08-08 LAB — HEPATIC FUNCTION PANEL
ALT: 12 IU/L (ref 0–32)
AST: 14 IU/L (ref 0–40)
Albumin: 4.4 g/dL (ref 3.5–5.5)
Alkaline Phosphatase: 83 IU/L (ref 39–117)
Bilirubin Total: 0.4 mg/dL (ref 0.0–1.2)
Bilirubin, Direct: 0.1 mg/dL (ref 0.00–0.40)
Total Protein: 6.9 g/dL (ref 6.0–8.5)

## 2018-08-08 LAB — LIPASE: Lipase: 38 U/L (ref 14–72)

## 2018-08-08 LAB — TSH: TSH: 1.29 u[IU]/mL (ref 0.450–4.500)

## 2018-08-13 ENCOUNTER — Ambulatory Visit (HOSPITAL_COMMUNITY)
Admission: RE | Admit: 2018-08-13 | Discharge: 2018-08-13 | Disposition: A | Discharge status: Home / Self Care | Payer: 59 | Source: Ambulatory Visit | Attending: Family Medicine | Admitting: Family Medicine

## 2018-08-13 DIAGNOSIS — R6881 Early satiety: Secondary | ICD-10-CM | POA: Diagnosis not present

## 2018-08-13 DIAGNOSIS — R112 Nausea with vomiting, unspecified: Secondary | ICD-10-CM | POA: Insufficient documentation

## 2018-08-13 DIAGNOSIS — K7689 Other specified diseases of liver: Secondary | ICD-10-CM | POA: Diagnosis not present

## 2018-08-14 ENCOUNTER — Other Ambulatory Visit: Payer: Self-pay | Admitting: Family Medicine

## 2018-08-14 ENCOUNTER — Telehealth: Payer: Self-pay | Admitting: Family Medicine

## 2018-08-14 MED ORDER — OXYCODONE-ACETAMINOPHEN 10-325 MG PO TABS: ORAL_TABLET | ORAL | 0 refills | Status: DC

## 2018-08-14 NOTE — Telephone Encounter (Signed)
It should be noted that we did enter patients pain medication and sent them to the [facility]

## 2018-08-18 ENCOUNTER — Other Ambulatory Visit: Payer: Self-pay | Admitting: *Deleted

## 2018-08-18 DIAGNOSIS — J849 Interstitial pulmonary disease, unspecified: Secondary | ICD-10-CM | POA: Diagnosis not present

## 2018-08-18 DIAGNOSIS — M6281 Muscle weakness (generalized): Secondary | ICD-10-CM | POA: Diagnosis not present

## 2018-08-18 DIAGNOSIS — D860 Sarcoidosis of lung: Secondary | ICD-10-CM | POA: Diagnosis not present

## 2018-08-18 DIAGNOSIS — J9601 Acute respiratory failure with hypoxia: Secondary | ICD-10-CM | POA: Diagnosis not present

## 2018-08-18 DIAGNOSIS — R109 Unspecified abdominal pain: Secondary | ICD-10-CM

## 2018-08-18 MED FILL — PROMETHAZINE 12.5 MG TABLET: 12.5 | 5 days supply | Qty: 15 | Fill #2

## 2018-08-21 MED FILL — OXYCODONE-ACETAMINOPHEN 10-: 10-325 | 30 days supply | Qty: 90 | Fill #0

## 2018-08-25 MED FILL — predniSONE 5 MG TABS: 5 | 90 days supply | Qty: 90 | Fill #2

## 2018-09-03 ENCOUNTER — Ambulatory Visit (HOSPITAL_COMMUNITY)
Admission: RE | Admit: 2018-09-03 | Discharge: 2018-09-03 | Disposition: A | Discharge status: Home / Self Care | Payer: 59 | Source: Ambulatory Visit | Attending: Family Medicine | Admitting: Family Medicine

## 2018-09-03 DIAGNOSIS — R109 Unspecified abdominal pain: Secondary | ICD-10-CM | POA: Insufficient documentation

## 2018-09-03 DIAGNOSIS — R112 Nausea with vomiting, unspecified: Secondary | ICD-10-CM | POA: Diagnosis not present

## 2018-09-03 MED ORDER — TECHNETIUM TC 99M MEBROFENIN IV KIT
5.0000 | PACK | Freq: Once | INTRAVENOUS | Status: AC | PRN
  Administered 2018-09-03: 5.4 via INTRAVENOUS

## 2018-09-04 ENCOUNTER — Telehealth: Payer: Self-pay | Admitting: Family Medicine

## 2018-09-04 DIAGNOSIS — R6881 Early satiety: Secondary | ICD-10-CM

## 2018-09-04 DIAGNOSIS — R112 Nausea with vomiting, unspecified: Secondary | ICD-10-CM

## 2018-09-04 NOTE — Telephone Encounter (Signed)
Patient advised per Dr Nicki Reaper: His advice to her is small amounts of food on a frequent basis throughout the day into the evening It would be best for her to go small amounts of things like dry cereal, mashed potatoes, Jell-O, pudding, soft fruits, soft vegetables She may even have to drink some supplements such as Ensure or boost until she is able to eat larger portions If she has ongoing troubles would like to be rechecked she can call us we will be happy to recheck her Otherwise lets have her see Dr.[name] in the near future Referral ordered in Epic. Patient verbalized understanding and stated she has been unable to keep anything down today including fluids and feel she is getting dehydrated. Patient advised to go to ER for evaluation and treatment. Patient states she is heading to ER as advised.

## 2018-09-04 NOTE — Telephone Encounter (Signed)
HIDA test is normal.  Next step would be referral to gastroenterology. (Please set her up with gastroenterology-preferably whoever she is seen in the past) If having severe ongoing troubles we can recheck her

## 2018-09-04 NOTE — Telephone Encounter (Signed)
Patient advised HIDA test is normal.  Next step would be referral to gastroenterology. Patient states she would like to see Dr Kathleen [name]. Patient states she is unable to eat anything-she takes one bite of food and that is all she can do

## 2018-09-04 NOTE — Telephone Encounter (Signed)
I agree with the ER assessment being needed

## 2018-09-04 NOTE — Telephone Encounter (Signed)
Patient would like results of her scan from yesterday.

## 2018-09-04 NOTE — Telephone Encounter (Signed)
My advice to her is small amounts of food on a frequent basis throughout the day into the evening It would be best for her to go small amounts of things like dry cereal, mashed potatoes, Jell-O, pudding, soft fruits, soft vegetables She may even have to drink some supplements such as Ensure or boost until she is able to eat larger portions If she has ongoing troubles would like to be rechecked she can call us we will be happy to recheck her Otherwise lets have her see Dr.[name] in the near future

## 2018-09-10 ENCOUNTER — Emergency Department (HOSPITAL_COMMUNITY)
Admission: EM | Admit: 2018-09-10 | Discharge: 2018-09-10 | Disposition: A | Discharge status: Home / Self Care | Payer: 59 | Attending: Emergency Medicine | Admitting: Emergency Medicine

## 2018-09-10 ENCOUNTER — Encounter (HOSPITAL_COMMUNITY): Payer: Self-pay | Admitting: Emergency Medicine

## 2018-09-10 ENCOUNTER — Encounter: Payer: Self-pay | Admitting: Family Medicine

## 2018-09-10 ENCOUNTER — Other Ambulatory Visit: Payer: Self-pay

## 2018-09-10 DIAGNOSIS — D869 Sarcoidosis, unspecified: Secondary | ICD-10-CM | POA: Diagnosis not present

## 2018-09-10 DIAGNOSIS — R112 Nausea with vomiting, unspecified: Secondary | ICD-10-CM | POA: Insufficient documentation

## 2018-09-10 MED ORDER — PROCHLORPERAZINE MALEATE 10 MG PO TABS: 10.0000 mg | ORAL_TABLET | Freq: Two times a day (BID) | ORAL | 0 refills | Status: DC | PRN

## 2018-09-10 MED ORDER — PANTOPRAZOLE SODIUM 20 MG PO TBEC: 20.0000 mg | DELAYED_RELEASE_TABLET | Freq: Every day | ORAL | 0 refills | Status: DC

## 2018-09-10 MED FILL — PROMETHAZINE 12.5 MG TABLET: 12.5 | 5 days supply | Qty: 15 | Fill #3

## 2018-09-10 MED FILL — ONDANSETRON ODT 8 MG TABLET: 8 | 10 days supply | Qty: 30 | Fill #1

## 2018-09-10 MED FILL — LEVOTHYROXINE 75 MCG TABLET: 75 | 30 days supply | Qty: 30 | Fill #2

## 2018-09-10 MED FILL — PANTOPRAZOLE SOD DR 40 MG T: 40 | 90 days supply | Qty: 90 | Fill #1

## 2018-09-10 NOTE — ED Triage Notes (Signed)
Pt has not been able to keep any solid food down x1 month. Has seen pcp and had neg ultrasounds.  Has taken zofran and phenergan with no relief.

## 2018-09-10 NOTE — ED Provider Notes (Signed)
[facility] EMERGENCY DEPARTMENT Provider Note   CSN: [id number] Arrival date & time: 09/10/18  1107     History   Chief Complaint Chief Complaint  Patient presents with  . Nausea    HPI Kathleen [name] is a 58 y.o. female.  Nausea and vomiting for multiple weeks.  She has had labs, an ultrasound and HIDA scan both of which were unhelpful.  She goes to [facility] for pulmonary fibrosis and sarcoidosis.  She takes Zofran for nausea, but Compazine has worked better in the past.  She now has to eat her food mushy such as applesauce.  No fever, sweats, chills, weight loss, diarrhea.  She has an appointment with Dr. [name] in the future.  Severity is moderate.  Nothing makes symptoms better or worse.     Past Medical History:  Diagnosis Date  . Adrenal insufficiency ([place])   . Anemia   . Avascular bone necrosis ([place])   . Breast fibrocystic disorder   . Chronic pain   . Fibromyalgia   . Gastroesophageal reflux disease   . Mitral valve prolapse   . Orthostatic hypotension   . Peripheral neuropathy   . Restrictive lung disease    Moderate to severe  . Sarcoidosis    Biopsy proven - [facility]  . SOB (shortness of breath)    chronic    Patient Active Problem List   Diagnosis Date Noted  . Adrenal insufficiency (Addison's disease) ([place])   . Nausea vomiting and diarrhea   . Abdominal pain 11/04/2017  . Dyspnea 10/21/2017  . Anemia 10/21/2017  . Influenza A 10/16/2017  . Acute adrenal crisis ([place]) 10/15/2017  . Gastroesophageal reflux disease 10/15/2017  . Personal history of noncompliance with medical treatment, presenting hazards to health 04/26/2017  . Hypothyroidism 01/15/2017  . Septic shock ([place])   . Elevated troponin   . Symptomatic bradycardia   . Acute respiratory failure with hypoxia ([place])   . Adrenal insufficiency ([place]) 08/17/2016  . Hypokalemia 08/17/2016  . Chronic pain syndrome 04/20/2016  . Vaginal bleeding 01/02/2016  . Acute blood loss anemia  12/31/2015  . Gastroenteritis   . Hypovolemic shock ([place]) 12/30/2015  . Nausea and vomiting 12/29/2015  . Hypotension 12/29/2015  . Dehydration 12/28/2015  . Constipation 11/14/2015  . Avascular necrosis of bone of left hip ([place]) 04/05/2015  . Bimalleolar fracture of right ankle 02/11/2015  . Post herpetic neuralgia 10/22/2014  . Hyperlipidemia 08/25/2014  . Obstructive sleep apnea 02/18/2014  . Obesity, unspecified 05/06/2013  . Fibromyalgia 02/11/2013  . Depression 02/11/2013  . Abdominal pain, other specified site 02/11/2013  . Shingles 02/02/2013  . Urinary retention 02/02/2013  . Pedal edema 01/22/2013  . Orthostatic hypotension 01/16/2013  . Disturbance of skin sensation 11/26/2012  . Pain in limb 11/26/2012  . Helicobacter pylori gastritis 05/23/2012  . Cough 05/02/2012  . Fibrocystic breast disease in female 04/09/2012  . DOE (dyspnea on exertion) 08/03/2011  . Anxiety state 10/19/2010  . CHEST PAIN 11/22/2008  . Sarcoidosis 06/21/2008    Past Surgical History:  Procedure Laterality Date  . BREAST LUMPECTOMY  01/12/2011   right  . TUBAL LIGATION       OB History    Gravida  1   Para  1   Term  1   Preterm      AB      Living        SAB      TAB      Ectopic  Multiple      Live Births               Home Medications    Prior to Admission medications   Medication Sig Start Date End Date Taking? Authorizing Provider  acetaminophen (TYLENOL) 500 MG tablet Take 1,000 mg by mouth every 8 (eight) hours as needed for headache.    [provider]  albuterol (PROVENTIL) (2.5 MG/3ML) 0.083% nebulizer solution Take 3 mLs (2.5 mg total) by nebulization every 2 (two) hours as needed for wheezing or shortness of breath. 02/05/18   [name], MD  benzonatate (TESSALON PERLES) 100 MG capsule Take 1 capsule (100 mg total) by mouth 3 (three) times daily as needed for cough. 02/05/18 02/05/19  [name], MD  budesonide (PULMICORT) 0.5  MG/2ML nebulizer solution Take 2 mLs (0.5 mg total) by nebulization 2 (two) times daily. 02/05/18   [name], MD  busPIRone (BUSPAR) 5 MG tablet One po TID Prn for anxiety 02/05/18   [name], MD  ferrous sulfate 325 (65 FE) MG EC tablet Take 325 mg by mouth daily with breakfast.    [provider]  levothyroxine (SYNTHROID, LEVOTHROID) 75 MCG tablet Take half tablet on Monday then one tablet all other days 02/05/18   [name], MD  levothyroxine (SYNTHROID, LEVOTHROID) 75 MCG tablet TAKE 1 TABLET BY MOUTH DAILY BEFORE BREAKFAST. 06/24/18   [name], MD  lubiprostone (AMITIZA) 24 MCG capsule Take 1 capsule (24 mcg total) by mouth 2 (two) times daily with a meal. 04/17/18   Luking, Elayne Snare, MD  midodrine (PROAMATINE) 5 MG tablet TAKE 1 TABLET BY MOUTH 2 TIMES DAILY WITH A MEAL. 02/05/18   [name], MD  Multiple Vitamins-Minerals (EMERGEN-C IMMUNE PO) Take 1-2 each by mouth daily. GUMMIES    [provider]  ondansetron (ZOFRAN-ODT) 8 MG disintegrating tablet DISSOLVE 1 TABLET BY MOUTH EVERY 8 HOURS AS NEEDED FOR NAUSEA OR VOMITING 08/07/18   [name], MD  oxyCODONE-acetaminophen (PERCOCET) 10-325 MG tablet 1 taken 3 times daily as needed pain 08/14/18   [name], MD  oxyCODONE-acetaminophen (PERCOCET) 10-325 MG tablet 1 taken 3 times daily as needed pain 08/14/18   [name], MD  oxyCODONE-acetaminophen (PERCOCET) 10-325 MG tablet 1 taken 3 times daily as needed pain 08/14/18   [name], MD  pantoprazole (PROTONIX) 20 MG tablet Take 1 tablet (20 mg total) by mouth daily. 09/10/18   Nat Christen, MD  pantoprazole (PROTONIX) 40 MG tablet Take 1 tablet (40 mg total) by mouth daily. 02/05/18   [name], MD  predniSONE (DELTASONE) 10 MG tablet Take 1 tablet (10 mg total) by mouth daily with breakfast. 10/22/17   Isaac Bliss, Rayford Halsted, MD  prochlorperazine (COMPAZINE) 10 MG tablet Take 1 tablet (10 mg total) by mouth 2  (two) times daily as needed for nausea or vomiting. 09/10/18   Nat Christen, MD  promethazine (PHENERGAN) 12.5 MG tablet Take 1 tablet (12.5 mg total) by mouth every 8 (eight) hours as needed for nausea or vomiting. 04/15/18   [name], MD  sertraline (ZOLOFT) 50 MG tablet Take 1 tablet (50 mg total) by mouth daily. 02/05/18   [name], MD    Family History Family History  Problem Relation Age of Onset  . Cardiomyopathy Mother   . Breast cancer Mother   . Prostate cancer Father   . Neurofibromatosis Brother   . Heart  disease Maternal Aunt   . Bipolar disorder Daughter   . Colon cancer Neg Hx     Social History Social History   Tobacco Use  . Smoking status: Never Smoker  . Smokeless tobacco: Never Used  Substance Use Topics  . Alcohol use: No  . Drug use: No     Allergies   Bee venom; Other; Codeine; and Morphine and related   Review of Systems Review of Systems  All other systems reviewed and are negative.    Physical Exam Updated Vital Signs BP 117/71 (BP Location: Right Arm)   Pulse 83   Temp 97.8 F (36.6 C) (Oral)   Resp 18   Ht 5\' 10"  (1.778 m)   Wt 99.8 kg   SpO2 96%   BMI 31.57 kg/m   Physical Exam Vitals signs and nursing note reviewed.  Constitutional:      Appearance: She is well-developed.     Comments: nad  HENT:     Head: Normocephalic and atraumatic.  Eyes:     Conjunctiva/sclera: Conjunctivae normal.  Neck:     Musculoskeletal: Neck supple.  Cardiovascular:     Rate and Rhythm: Normal rate and regular rhythm.  Pulmonary:     Effort: Pulmonary effort is normal.     Breath sounds: Normal breath sounds.  Abdominal:     General: Bowel sounds are normal.     Palpations: Abdomen is soft.  Musculoskeletal: Normal range of motion.  Skin:    General: Skin is warm and dry.  Neurological:     Mental Status: She is alert and oriented to person, place, and time.  Psychiatric:        Behavior: Behavior normal.      ED  Treatments / Results  Labs (all labs ordered are listed, but only abnormal results are displayed) Labs Reviewed - No data to display  EKG None  Radiology No results found.  Procedures Procedures (including critical care time)  Medications Ordered in ED Medications - No data to display   Initial Impression / Assessment and Plan / ED Course  I have reviewed the triage vital signs and the nursing notes.  Pertinent labs & imaging results that were available during my care of the patient were reviewed by me and considered in my medical decision making (see chart for details).     Patient presents with persistent nausea and vomiting.  She has been evaluated for this in the recent past.  She does not want a major work-up today.  Will Rx Compazine 10 mg and Protonix 20 mg in effort to help her.  I called the gastroenterology office in an attempt to move up her appointment.  Final Clinical Impressions(s) / ED Diagnoses   Final diagnoses:  Nausea and vomiting, intractability of vomiting not specified, unspecified vomiting type    ED Discharge Orders         Ordered    prochlorperazine (COMPAZINE) 10 MG tablet  2 times daily PRN     09/10/18 1255    pantoprazole (PROTONIX) 20 MG tablet  Daily     09/10/18 1255           Nat Christen, MD 09/10/18 1733

## 2018-09-10 NOTE — Discharge Instructions (Signed)
Prescription for Compazine and ulcer medication.  I called the gastroenterologist and left a message to try to get you in quicker.

## 2018-09-11 ENCOUNTER — Encounter (INDEPENDENT_AMBULATORY_CARE_PROVIDER_SITE_OTHER): Payer: Self-pay

## 2018-09-11 ENCOUNTER — Encounter (INDEPENDENT_AMBULATORY_CARE_PROVIDER_SITE_OTHER): Payer: Self-pay | Admitting: *Deleted

## 2018-09-11 ENCOUNTER — Ambulatory Visit (INDEPENDENT_AMBULATORY_CARE_PROVIDER_SITE_OTHER): Payer: Self-pay | Admitting: Internal Medicine

## 2018-09-11 ENCOUNTER — Encounter (INDEPENDENT_AMBULATORY_CARE_PROVIDER_SITE_OTHER): Payer: Self-pay | Admitting: Internal Medicine

## 2018-09-11 ENCOUNTER — Ambulatory Visit (INDEPENDENT_AMBULATORY_CARE_PROVIDER_SITE_OTHER): Payer: 59 | Admitting: Internal Medicine

## 2018-09-11 DIAGNOSIS — R112 Nausea with vomiting, unspecified: Secondary | ICD-10-CM

## 2018-09-11 NOTE — Progress Notes (Signed)
Subjective:    Patient ID: Kathleen [name], female    DOB: 09/30/1960, 58 y.o.   MRN: [id number]  HPI Referred by Dr. Sallee Lange for nausea.vomiting. Has had nausea for years. She has lost 10 pounds in the last couple of months.  Her appetite is not good. She vomits every day.  No abdominal pain. She says when she smells foods she becomes nauseated.  09/03/2017 Underwent a HIDA scan for nausea which was normal. Hx of constipation and maintained on Amitiza. [place] on 08/13/2018 was normal. CBC 3.34m. Underwent an EGD in 2013 for chronic nausea/vomiting by Dr. GCarlean Purlwhich was normal.  Hx of adrenal insufficiency, pulmonary fibrosis, sarcoidosis  CBC    Component Value Date/Time   WBC 8.3 08/07/2018 0958   WBC 8.2 11/05/2017 0606   RBC 4.31 08/07/2018 0958   RBC 4.41 11/05/2017 0606   HGB 12.6 08/07/2018 0958   HCT 36.9 08/07/2018 0958   PLT 359 08/07/2018 0958   MCV 86 08/07/2018 0958   MCH 29.2 08/07/2018 0958   MCH 28.1 11/05/2017 0606   MCHC 34.1 08/07/2018 0958   MCHC 30.8 11/05/2017 0606   RDW 14.1 08/07/2018 0958   LYMPHSABS 0.9 08/07/2018 0958   MONOABS 0.9 10/21/2017 1335   EOSABS 0.3 08/07/2018 0958   BASOSABS 0.1 08/07/2018 0958      Hepatic Function Latest Ref Rng & Units 08/07/2018 11/05/2017 11/04/2017  Total Protein 6.0 - 8.5 g/dL 6.9 6.0(L) 6.8  Albumin 3.5 - 5.5 g/dL 4.4 3.2(L) 3.7  AST 0 - 40 IU/L 14 16 20   ALT 0 - 32 IU/L 12 22 22   Alk Phosphatase 39 - 117 IU/L 83 58 59  Total Bilirubin 0.0 - 1.2 mg/dL 0.4 0.6 0.9  Bilirubin, Direct 0.00 - 0.40 mg/dL 0.10 - -     Review of Systems Past Medical History:  Diagnosis Date  . [place]   . Anemia   . [place]   . Breast fibrocystic disorder   . Chronic pain   . Fibromyalgia   . Gastroesophageal reflux disease   . Mitral valve prolapse   . Orthostatic hypotension   . Peripheral neuropathy   . Restrictive lung disease    Moderate to severe  . Sarcoidosis    Biopsy proven - [facility]  . SOB (shortness of breath)    chronic    Past Surgical History:  Procedure Laterality Date  . BREAST LUMPECTOMY  01/12/2011   right  . TUBAL LIGATION      Allergies  Allergen Reactions  . Bee Venom Anaphylaxis  . Other Other (See Comments)    Contrast Dye CT chest w/ contrast at OSH followed by SOB resolved w/ single dose steroids, never ENT swelling, intubation or pressors, has had multiple contrasted scans since  . Codeine Itching    Other reaction(s): ITCHING  . Morphine And Related Other (See Comments)    Unknown reaction- pt gets sick, dizzy, and confused Intolerance, not an allergy, received this in the emergency department recently without a problem.    Current Outpatient Medications on File Prior to Visit  Medication Sig Dispense Refill  . acetaminophen (TYLENOL) 500 MG tablet Take 1,000 mg by mouth every 8 (eight) hours as needed for headache.    . albuterol (PROVENTIL) (2.5 MG/3ML) 0.083% nebulizer solution Take 3 mLs (2.5 mg total) by nebulization every 2 (two) hours as needed for wheezing or shortness of breath. 75 mL 12  . budesonide (PULMICORT) 0.5 MG/2ML  nebulizer solution Take 2 mLs (0.5 mg total) by nebulization 2 (two) times daily.  6  . busPIRone (BUSPAR) 5 MG tablet One po TID Prn for anxiety 30 tablet 5  . ferrous sulfate 325 (65 FE) MG EC tablet Take 325 mg by mouth daily with breakfast.    . levothyroxine (SYNTHROID, LEVOTHROID) 75 MCG tablet Take half tablet on Monday then one tablet all other days 26 tablet 5  . lubiprostone (AMITIZA) 24 MCG capsule Take 1 capsule (24 mcg total) by mouth 2 (two) times daily with a meal. 60 capsule 5  . midodrine (PROAMATINE) 5 MG tablet TAKE 1 TABLET BY MOUTH 2 TIMES DAILY WITH A MEAL. 60 tablet 4  . ondansetron (ZOFRAN-ODT) 8 MG disintegrating tablet DISSOLVE 1 TABLET BY MOUTH EVERY 8 HOURS AS NEEDED FOR NAUSEA OR VOMITING 30 tablet 5  . oxyCODONE-acetaminophen (PERCOCET) 10-325 MG tablet 1 taken 3  times daily as needed pain (Patient taking differently: 3 (three) times daily. 1 taken 3 times daily as needed pain) 90 tablet 0  . pantoprazole (PROTONIX) 40 MG tablet Take 1 tablet (40 mg total) by mouth daily. 90 tablet 3  . predniSONE (DELTASONE) 10 MG tablet Take 1 tablet (10 mg total) by mouth daily with breakfast. 30 tablet 2  . prochlorperazine (COMPAZINE) 10 MG tablet Take 1 tablet (10 mg total) by mouth 2 (two) times daily as needed for nausea or vomiting. 10 tablet 0  . promethazine (PHENERGAN) 12.5 MG tablet Take 1 tablet (12.5 mg total) by mouth every 8 (eight) hours as needed for nausea or vomiting. 15 tablet 3   No current facility-administered medications on file prior to visit.         Objective:   Physical Exam Blood pressure 115/74, pulse 89, temperature 98.1 F (36.7 C), height 5' 10"  (1.778 m), weight 215 lb 1.6 oz (97.6 kg).  Alert and oriented. Skin warm and dry. Oral mucosa is moist.   . Sclera anicteric, conjunctivae is pink. Thyroid not enlarged. No cervical lymphadenopathy. Lungs clear. Heart regular rate and rhythm.  Abdomen is soft. Bowel sounds are positive. No hepatomegaly. No abdominal masses felt. No tenderness.  No edema to lower extremities.        Assessment & Plan:  Nausea and vomiting. Am going to get an Emptying study. If normal. EGD. If she needs an EGD, will need to be with propofol.

## 2018-09-11 NOTE — Patient Instructions (Signed)
Emptying study.  

## 2018-09-17 ENCOUNTER — Telehealth (INDEPENDENT_AMBULATORY_CARE_PROVIDER_SITE_OTHER): Payer: Self-pay | Admitting: Internal Medicine

## 2018-09-17 ENCOUNTER — Encounter (HOSPITAL_COMMUNITY)
Admission: RE | Admit: 2018-09-17 | Discharge: 2018-09-17 | Disposition: A | Discharge status: Home / Self Care | Payer: 59 | Source: Ambulatory Visit | Attending: Internal Medicine | Admitting: Internal Medicine

## 2018-09-17 ENCOUNTER — Other Ambulatory Visit (INDEPENDENT_AMBULATORY_CARE_PROVIDER_SITE_OTHER): Payer: Self-pay | Admitting: Internal Medicine

## 2018-09-17 ENCOUNTER — Encounter (HOSPITAL_COMMUNITY): Payer: Self-pay

## 2018-09-17 DIAGNOSIS — R112 Nausea with vomiting, unspecified: Secondary | ICD-10-CM | POA: Insufficient documentation

## 2018-09-17 DIAGNOSIS — R110 Nausea: Secondary | ICD-10-CM

## 2018-09-17 MED ORDER — TECHNETIUM TC 99M SULFUR COLLOID
2.0000 | Freq: Once | INTRAVENOUS | Status: AC | PRN
  Administered 2018-09-17: 2.1 via ORAL

## 2018-09-17 NOTE — Telephone Encounter (Signed)
Kathleen [name], EGD. I have spoken with patient.  

## 2018-09-17 NOTE — Telephone Encounter (Signed)
err

## 2018-09-18 ENCOUNTER — Other Ambulatory Visit (HOSPITAL_COMMUNITY): Payer: Self-pay

## 2018-09-18 DIAGNOSIS — J849 Interstitial pulmonary disease, unspecified: Secondary | ICD-10-CM | POA: Diagnosis not present

## 2018-09-18 DIAGNOSIS — D860 Sarcoidosis of lung: Secondary | ICD-10-CM | POA: Diagnosis not present

## 2018-09-18 DIAGNOSIS — J9601 Acute respiratory failure with hypoxia: Secondary | ICD-10-CM | POA: Diagnosis not present

## 2018-09-18 DIAGNOSIS — M6281 Muscle weakness (generalized): Secondary | ICD-10-CM | POA: Diagnosis not present

## 2018-09-18 NOTE — Telephone Encounter (Signed)
Will need to be done with propofol

## 2018-09-18 NOTE — Telephone Encounter (Signed)
Please review for possible propofol

## 2018-09-22 ENCOUNTER — Encounter (INDEPENDENT_AMBULATORY_CARE_PROVIDER_SITE_OTHER): Payer: Self-pay | Admitting: *Deleted

## 2018-09-22 ENCOUNTER — Other Ambulatory Visit (INDEPENDENT_AMBULATORY_CARE_PROVIDER_SITE_OTHER): Payer: Self-pay | Admitting: Internal Medicine

## 2018-09-22 DIAGNOSIS — R112 Nausea with vomiting, unspecified: Secondary | ICD-10-CM

## 2018-09-22 DIAGNOSIS — R1110 Vomiting, unspecified: Secondary | ICD-10-CM | POA: Insufficient documentation

## 2018-09-22 MED FILL — OXYCODONE-ACETAMINOPHEN 10-: 10-325 | 30 days supply | Qty: 90 | Fill #0

## 2018-09-22 NOTE — Telephone Encounter (Signed)
noted 

## 2018-09-22 NOTE — Telephone Encounter (Signed)
Spoke to patient, she wants to talk to her lung doctor before scheduling because if she's at high risk because of her kung disease she wants to have it done at [facility], she will call me after her appt with lung doctor on 10/07/18

## 2018-09-30 ENCOUNTER — Encounter: Payer: Self-pay | Admitting: Family Medicine

## 2018-09-30 ENCOUNTER — Ambulatory Visit (INDEPENDENT_AMBULATORY_CARE_PROVIDER_SITE_OTHER): Payer: 59 | Admitting: Family Medicine

## 2018-09-30 VITALS — BP 112/72 | Temp 97.5°F | Ht 70.0 in | Wt 220.0 lb

## 2018-09-30 DIAGNOSIS — L0291 Cutaneous abscess, unspecified: Secondary | ICD-10-CM

## 2018-09-30 MED ORDER — DOXYCYCLINE HYCLATE 100 MG PO TABS: 100.0000 mg | ORAL_TABLET | Freq: Two times a day (BID) | ORAL | 0 refills | Status: DC

## 2018-09-30 MED FILL — DOXYCYCLINE HYCLATE 100 MG: 100 | 7 days supply | Qty: 14 | Fill #0

## 2018-09-30 NOTE — Patient Instructions (Signed)
ROM compresses 15 minutes at a time 4 5 times per day  If not dramatically better by Thursday morning call us and we will recheck  Doxycycline 100 mg 1 twice daily with a snack in the tall glass of water for the next 7 days

## 2018-09-30 NOTE — Progress Notes (Signed)
   Subjective:    Patient ID: Kathleen [name], female    DOB: [birth year], 58 y.o.   MRN: [id number]  HPI  Patient is here today with complaints of a knot under her left arm. Patient under the left arm has a small abscess that she stated is been coming on the past few days painful  This is reoccurring.She states noticed this this past weekend. It is painful and she is unable to apply her deodorant.  Review of Systems Denies high fever chills sweats wheezing difficulty breathing vomiting diarrhea    Objective:   Physical Exam Respiratory rate normal heart rate normal Small abscess underneath the left arm 15 minutes was spent with patient today discussing healthcare issues which they came.  More than 50% of this visit-total duration of visit-was spent in counseling and coordination of care.  Please see diagnosis regarding the focus of this coordination and care        Assessment & Plan:  Abscess Verbal consent obtained Timeout taken Patient did well with procedure Lidocaine with epinephrine Betadine 11.  Blade small incision may Several mL's of pus obtained No packing necessary Doxycycline twice daily for the next 7 days warm compresses frequently warning signs discussed follow-up 48 hours if not doing better sooner if worse

## 2018-10-01 ENCOUNTER — Other Ambulatory Visit: Payer: Self-pay | Admitting: Family Medicine

## 2018-10-02 MED FILL — PROMETHAZINE 12.5 MG TABLET: 12.5 | 5 days supply | Qty: 15 | Fill #0

## 2018-10-03 ENCOUNTER — Other Ambulatory Visit (HOSPITAL_COMMUNITY): Payer: Self-pay

## 2018-10-06 DIAGNOSIS — M25559 Pain in unspecified hip: Secondary | ICD-10-CM | POA: Diagnosis not present

## 2018-10-06 DIAGNOSIS — I951 Orthostatic hypotension: Secondary | ICD-10-CM | POA: Diagnosis not present

## 2018-10-06 DIAGNOSIS — J849 Interstitial pulmonary disease, unspecified: Secondary | ICD-10-CM | POA: Diagnosis not present

## 2018-10-06 DIAGNOSIS — R0602 Shortness of breath: Secondary | ICD-10-CM | POA: Diagnosis not present

## 2018-10-06 DIAGNOSIS — R112 Nausea with vomiting, unspecified: Secondary | ICD-10-CM | POA: Diagnosis not present

## 2018-10-06 DIAGNOSIS — D860 Sarcoidosis of lung: Secondary | ICD-10-CM | POA: Diagnosis not present

## 2018-10-07 ENCOUNTER — Other Ambulatory Visit (INDEPENDENT_AMBULATORY_CARE_PROVIDER_SITE_OTHER): Payer: Self-pay | Admitting: Internal Medicine

## 2018-10-07 DIAGNOSIS — R112 Nausea with vomiting, unspecified: Secondary | ICD-10-CM

## 2018-10-10 ENCOUNTER — Ambulatory Visit (HOSPITAL_COMMUNITY): Admit: 2018-10-10 | Payer: 59 | Admitting: Internal Medicine

## 2018-10-10 ENCOUNTER — Encounter (HOSPITAL_COMMUNITY): Payer: Self-pay

## 2018-10-10 SURGERY — ESOPHAGOGASTRODUODENOSCOPY (EGD) WITH PROPOFOL
Anesthesia: Monitor Anesthesia Care

## 2018-10-10 MED FILL — PROMETHAZINE 12.5 MG TABLET: 12.5 | 5 days supply | Qty: 15 | Fill #1

## 2018-10-10 MED FILL — ONDANSETRON ODT 8 MG TABLET: 8 | 10 days supply | Qty: 30 | Fill #2

## 2018-10-13 MED FILL — LEVOTHYROXINE 75 MCG TABLET: 75 | 30 days supply | Qty: 30 | Fill #3

## 2018-10-14 ENCOUNTER — Encounter (HOSPITAL_COMMUNITY)
Admission: RE | Admit: 2018-10-14 | Discharge: 2018-10-14 | Disposition: A | Discharge status: Home / Self Care | Payer: 59 | Source: Ambulatory Visit | Attending: Internal Medicine | Admitting: Internal Medicine

## 2018-10-17 ENCOUNTER — Encounter (HOSPITAL_COMMUNITY)
Admission: RE | Disposition: A | Discharge status: Home / Self Care | Payer: Self-pay | Source: Home / Self Care | Attending: Internal Medicine

## 2018-10-17 ENCOUNTER — Ambulatory Visit (HOSPITAL_COMMUNITY): Payer: 59 | Admitting: Anesthesiology

## 2018-10-17 ENCOUNTER — Ambulatory Visit (HOSPITAL_COMMUNITY)
Admission: RE | Admit: 2018-10-17 | Discharge: 2018-10-17 | Disposition: A | Discharge status: Home / Self Care | Payer: 59 | Attending: Internal Medicine | Admitting: Internal Medicine

## 2018-10-17 ENCOUNTER — Other Ambulatory Visit: Payer: Self-pay

## 2018-10-17 ENCOUNTER — Encounter (HOSPITAL_COMMUNITY): Payer: Self-pay

## 2018-10-17 DIAGNOSIS — Z79899 Other long term (current) drug therapy: Secondary | ICD-10-CM | POA: Diagnosis not present

## 2018-10-17 DIAGNOSIS — I341 Nonrheumatic mitral (valve) prolapse: Secondary | ICD-10-CM | POA: Insufficient documentation

## 2018-10-17 DIAGNOSIS — D649 Anemia, unspecified: Secondary | ICD-10-CM | POA: Diagnosis not present

## 2018-10-17 DIAGNOSIS — Z8269 Family history of other diseases of the musculoskeletal system and connective tissue: Secondary | ICD-10-CM | POA: Diagnosis not present

## 2018-10-17 DIAGNOSIS — D869 Sarcoidosis, unspecified: Secondary | ICD-10-CM | POA: Insufficient documentation

## 2018-10-17 DIAGNOSIS — K2970 Gastritis, unspecified, without bleeding: Secondary | ICD-10-CM

## 2018-10-17 DIAGNOSIS — M797 Fibromyalgia: Secondary | ICD-10-CM | POA: Insufficient documentation

## 2018-10-17 DIAGNOSIS — Z885 Allergy status to narcotic agent status: Secondary | ICD-10-CM | POA: Insufficient documentation

## 2018-10-17 DIAGNOSIS — R112 Nausea with vomiting, unspecified: Secondary | ICD-10-CM

## 2018-10-17 DIAGNOSIS — K5909 Other constipation: Secondary | ICD-10-CM | POA: Insufficient documentation

## 2018-10-17 DIAGNOSIS — G8929 Other chronic pain: Secondary | ICD-10-CM | POA: Diagnosis not present

## 2018-10-17 DIAGNOSIS — J984 Other disorders of lung: Secondary | ICD-10-CM | POA: Insufficient documentation

## 2018-10-17 DIAGNOSIS — F419 Anxiety disorder, unspecified: Secondary | ICD-10-CM | POA: Insufficient documentation

## 2018-10-17 DIAGNOSIS — R1110 Vomiting, unspecified: Secondary | ICD-10-CM | POA: Insufficient documentation

## 2018-10-17 DIAGNOSIS — M879 Osteonecrosis, unspecified: Secondary | ICD-10-CM | POA: Insufficient documentation

## 2018-10-17 DIAGNOSIS — K2950 Unspecified chronic gastritis without bleeding: Secondary | ICD-10-CM | POA: Diagnosis not present

## 2018-10-17 DIAGNOSIS — K219 Gastro-esophageal reflux disease without esophagitis: Secondary | ICD-10-CM | POA: Insufficient documentation

## 2018-10-17 DIAGNOSIS — F329 Major depressive disorder, single episode, unspecified: Secondary | ICD-10-CM | POA: Diagnosis not present

## 2018-10-17 DIAGNOSIS — Z8042 Family history of malignant neoplasm of prostate: Secondary | ICD-10-CM | POA: Insufficient documentation

## 2018-10-17 DIAGNOSIS — Z8249 Family history of ischemic heart disease and other diseases of the circulatory system: Secondary | ICD-10-CM | POA: Insufficient documentation

## 2018-10-17 DIAGNOSIS — Z818 Family history of other mental and behavioral disorders: Secondary | ICD-10-CM | POA: Insufficient documentation

## 2018-10-17 DIAGNOSIS — Z91030 Bee allergy status: Secondary | ICD-10-CM | POA: Diagnosis not present

## 2018-10-17 DIAGNOSIS — G629 Polyneuropathy, unspecified: Secondary | ICD-10-CM | POA: Diagnosis not present

## 2018-10-17 DIAGNOSIS — K449 Diaphragmatic hernia without obstruction or gangrene: Secondary | ICD-10-CM | POA: Insufficient documentation

## 2018-10-17 DIAGNOSIS — E2740 Unspecified adrenocortical insufficiency: Secondary | ICD-10-CM | POA: Insufficient documentation

## 2018-10-17 DIAGNOSIS — Z888 Allergy status to other drugs, medicaments and biological substances status: Secondary | ICD-10-CM | POA: Insufficient documentation

## 2018-10-17 DIAGNOSIS — Z803 Family history of malignant neoplasm of breast: Secondary | ICD-10-CM | POA: Diagnosis not present

## 2018-10-17 HISTORY — PX: BIOPSY: SHX5522

## 2018-10-17 HISTORY — PX: ESOPHAGOGASTRODUODENOSCOPY (EGD) WITH PROPOFOL: SHX5813

## 2018-10-17 SURGERY — ESOPHAGOGASTRODUODENOSCOPY (EGD) WITH PROPOFOL
Anesthesia: General

## 2018-10-17 MED ORDER — PROMETHAZINE HCL 25 MG/ML IJ SOLN
INTRAMUSCULAR | Status: AC
  Filled 2018-10-17: qty 1

## 2018-10-17 MED ORDER — MIDAZOLAM HCL 2 MG/2ML IJ SOLN: 0.5000 mg | Freq: Once | INTRAMUSCULAR | Status: DC | PRN

## 2018-10-17 MED ORDER — PROPOFOL 500 MG/50ML IV EMUL
INTRAVENOUS | Status: DC | PRN
  Administered 2018-10-17: 200 ug/kg/min via INTRAVENOUS

## 2018-10-17 MED ORDER — PROMETHAZINE HCL 25 MG/ML IJ SOLN
6.2500 mg | INTRAMUSCULAR | Status: DC | PRN
  Administered 2018-10-17: 6.25 mg via INTRAVENOUS

## 2018-10-17 MED ORDER — KETAMINE HCL 50 MG/5ML IJ SOSY
PREFILLED_SYRINGE | INTRAMUSCULAR | Status: AC
  Filled 2018-10-17: qty 5

## 2018-10-17 MED ORDER — SODIUM CHLORIDE 0.9% FLUSH
INTRAVENOUS | Status: AC
  Filled 2018-10-17: qty 10

## 2018-10-17 MED ORDER — LACTATED RINGERS IV SOLN
INTRAVENOUS | Status: DC | PRN
  Administered 2018-10-17: 09:00:00 via INTRAVENOUS

## 2018-10-17 MED ORDER — CHLORHEXIDINE GLUCONATE CLOTH 2 % EX PADS: 6.0000 | MEDICATED_PAD | Freq: Once | CUTANEOUS | Status: DC

## 2018-10-17 MED ORDER — LACTATED RINGERS IV SOLN
INTRAVENOUS | Status: DC
  Administered 2018-10-17: 10:00:00 via INTRAVENOUS

## 2018-10-17 MED ORDER — PROPOFOL 10 MG/ML IV BOLUS
INTRAVENOUS | Status: DC | PRN
  Administered 2018-10-17: 20 mg via INTRAVENOUS

## 2018-10-17 MED ORDER — STERILE WATER FOR IRRIGATION IR SOLN
Status: DC | PRN
  Administered 2018-10-17: 1.5 mL

## 2018-10-17 MED ORDER — KETAMINE HCL 10 MG/ML IJ SOLN
INTRAMUSCULAR | Status: DC | PRN
  Administered 2018-10-17: 10 mg via INTRAVENOUS
  Administered 2018-10-17 (×2): 5 mg via INTRAVENOUS

## 2018-10-17 NOTE — Discharge Instructions (Signed)
No aspirin or NSAIDs for 24 hours. Resume usual medications as before. Resume usual diet. No driving for 24 hours. Keep daily symptom diary asked to frequency of vomiting spells and mealtimes. Physician will call with biopsy results and further recommendations.

## 2018-10-17 NOTE — Anesthesia Postprocedure Evaluation (Signed)
Anesthesia Post Note  Patient: Kathleen [name]  Procedure(s) Performed: ESOPHAGOGASTRODUODENOSCOPY (EGD) WITH PROPOFOL (N/A ) BIOPSY  Patient location during evaluation: PACU Anesthesia Type: General Level of consciousness: awake and patient cooperative Pain management: pain level controlled Vital Signs Assessment: post-procedure vital signs reviewed and stable Respiratory status: spontaneous breathing, nonlabored ventilation and respiratory function stable Cardiovascular status: blood pressure returned to baseline Postop Assessment: no apparent nausea or vomiting Anesthetic complications: no     Last Vitals:  Vitals:   10/17/18 0911  BP: 121/70  Pulse: 85  Resp: 16  Temp: 36.7 C  SpO2: 96%    Last Pain:  Vitals:   10/17/18 1031  TempSrc:   PainSc: 3                  [name] J

## 2018-10-17 NOTE — Anesthesia Preprocedure Evaluation (Signed)
Anesthesia Evaluation  Patient identified by MRN, date of birth, ID band Patient awake    Reviewed: Allergy & Precautions, NPO status , Patient's Chart, lab work & pertinent test results  Airway Mallampati: II  TM Distance: >3 FB Neck ROM: Full    Dental no notable dental hx. (+) Poor Dentition   Pulmonary shortness of breath, with exertion and Long-Term Oxygen Therapy,  Denies OSA when questioned  Doesn't use CPAP Reports home o2 use and previous ventilator need  Has Sarcoid and pulm fibrosis by history     Pulmonary exam normal breath sounds clear to auscultation       Cardiovascular Exercise Tolerance: Good + DOE  Normal cardiovascular examI Rhythm:Regular Rate:Normal  Feb/18 EF 55-60   Neuro/Psych Anxiety Depression negative neurological ROS  negative psych ROS   GI/Hepatic Neg liver ROS, GERD  Medicated and Controlled,Denies GERD Sx today   Endo/Other  Hypothyroidism   Renal/GU negative Renal ROS  negative genitourinary   Musculoskeletal  (+) Fibromyalgia -  Abdominal   Peds negative pediatric ROS (+)  Hematology negative hematology ROS (+) anemia ,   Anesthesia Other Findings   Reproductive/Obstetrics negative OB ROS                             Anesthesia Physical Anesthesia Plan  ASA: III  Anesthesia Plan: General   Post-op Pain Management:    Induction: Intravenous  PONV Risk Score and Plan:   Airway Management Planned: Nasal Cannula and Simple Face Mask  Additional Equipment:   Intra-op Plan:   Post-operative Plan: Extubation in OR  Informed Consent: I have reviewed the patients History and Physical, chart, labs and discussed the procedure including the risks, benefits and alternatives for the proposed anesthesia with the patient or authorized representative who has indicated his/her understanding and acceptance.     Dental advisory given  Plan Discussed  with: CRNA  Anesthesia Plan Comments: (Plan Ketamine/Propofol only  Will do GETA as needed  Plan without if tolerates)        Anesthesia Quick Evaluation

## 2018-10-17 NOTE — Op Note (Signed)
[facility] Patient Name: Kathleen [name] Procedure Date: 10/17/2018 10:03 AM MRN: [id number] Date of Birth: [birth year] Attending MD: Hildred Laser , MD CSN: [id number] Age: 58 Admit Type: Outpatient Procedure:                Upper GI endoscopy Indications:              Nausea with vomiting Providers:                Hildred Laser, MD, Real Cons. Sharlet Salina MD, MD,                            Hinton Rao, RN, Charlsie Quest. Theda Sers RN, RN Referring MD:             Elayne Snare. Luking, MD Medicines:                Propofol per Anesthesia Complications:            No immediate complications. Estimated Blood Loss:     Estimated blood loss was minimal. Procedure:                Pre-Anesthesia Assessment:                           - Prior to the procedure, a History and Physical                            was performed, and patient medications and                            allergies were reviewed. The patient's tolerance of                            previous anesthesia was also reviewed. The risks                            and benefits of the procedure and the sedation                            options and risks were discussed with the patient.                            All questions were answered, and informed consent                            was obtained. Prior Anticoagulants: The patient has                            taken no previous anticoagulant or antiplatelet                            agents. ASA Grade Assessment: III - A patient with                            severe systemic disease. After reviewing the risks  and benefits, the patient was deemed in                            satisfactory condition to undergo the procedure.                           After obtaining informed consent, the endoscope was                            passed under direct vision. Throughout the                            procedure, the patient's blood pressure, pulse, and                         oxygen saturations were monitored continuously. The                            [id number]-H190 ([id number]) scope was introduced through the                            and advanced to the second part of duodenum. The                            upper GI endoscopy was accomplished without                            difficulty. The patient tolerated the procedure                            well. Scope In: 10:34:06 AM Scope Out: 10:45:08 AM Total Procedure Duration: 0 hours 11 minutes 2 seconds  Findings:      The examined esophagus was normal.      The Z-line was regular and was found 40 cm from the incisors.      A 2 cm hiatal hernia was present.      Patchy mild inflammation characterized by congestion (edema) and       erythema was found in the gastric body and in the gastric antrum.       Biopsies were taken from gastric body with a cold forceps for histology.       The pathology specimen was placed into Bottle Number 3. Biopsies were       taken from antral mucosa with a cold forceps for histology. The       pathology specimen was placed into Bottle Number 2.      The cardia, gastric fundus and pylorus were normal.      The duodenal bulb and second portion of the duodenum were normal.       Biopsies for histology were taken with a cold forceps for evaluation of       celiac disease. The pathology specimen was placed into Bottle Number 1. Impression:               - Normal esophagus.                           - Z-line regular,  40 cm from the incisors.                           - 2 cm hiatal hernia.                           - Gastritis. Biopsied.                           - Normal cardia, gastric fundus and pylorus.                           - Normal duodenal bulb and second portion of the                            duodenum. Biopsied. Moderate Sedation:      Per Anesthesia Care      Per Anesthesia Care Recommendation:           - Patient has a contact number available  for                            emergencies. The signs and symptoms of potential                            delayed complications were discussed with the                            patient. Return to normal activities tomorrow.                            Written discharge instructions were provided to the                            patient.                           - Resume previous diet today.                           - Continue present medications.                           - No aspirin, ibuprofen, naproxen, or other                            non-steroidal anti-inflammatory drugs for 1 day.                           - Await pathology results. Procedure Code(s):        --- Professional ---                           [phone], Esophagogastroduodenoscopy, flexible,                            transoral; with biopsy, single or multiple Diagnosis Code(s):        ---  Professional ---                           K44.9, Diaphragmatic hernia without obstruction or                            gangrene                           K29.70, Gastritis, unspecified, without bleeding                           R11.2, Nausea with vomiting, unspecified CPT copyright 2018 American Medical Association. All rights reserved. The codes documented in this report are preliminary and upon coder review may  be revised to meet current compliance requirements. Hildred Laser, MD Hildred Laser, MD 10/17/2018 10:54:10 AM This report has been signed electronically. Elizabeth A. Crawford MD, MD Number of Addenda: 0

## 2018-10-17 NOTE — H&P (Signed)
Kathleen [name] is an 58 y.o. female.   Chief Complaint: Patient is here for EGD. HPI: Patient is 58 year old Caucasian female who has history of vomiting in the past with negative EGD about 7 years ago.  She says her vomiting is back.  She has been having frequent of daily vomiting for the last 6 to 8 months.  She is taking ondansetron before each meal and Phenergan at bedtime and it seems to have helped some.  Vomiting occurs for meals.  She generally vomits food that she had just eaten.  No history of hematemesis or melena.  She is not losing weight.  She has chronic constipation for which she takes Amitiza and it helps.  She says heartburn is well controlled with PPI.  She has chronic pain due to avascular necrosis of hip and not able to do much exercise.  She is never been diagnosed with peptic ulcer disease or H. pylori infection. Recent work-up includes ultrasound negative for cholelithiasis HIDA scan with normal EF and normal gastric emptying study.  She does not take OTC NSAIDs.  Past Medical History:  Diagnosis Date  . Adrenal insufficiency ([place])   . Anemia   . Avascular bone necrosis ([place])   . Breast fibrocystic disorder   . Chronic pain   . Fibromyalgia   . Gastroesophageal reflux disease   . Mitral valve prolapse   . Orthostatic hypotension   . Peripheral neuropathy   . Restrictive lung disease    Moderate to severe  . Sarcoidosis    Biopsy proven - [facility]  . SOB (shortness of breath)    chronic    Past Surgical History:  Procedure Laterality Date  . BREAST LUMPECTOMY  01/12/2011   right  . TUBAL LIGATION      Family History  Problem Relation Age of Onset  . Cardiomyopathy Mother   . Breast cancer Mother   . Prostate cancer Father   . Neurofibromatosis Brother   . Heart disease Maternal Aunt   . Bipolar disorder Daughter   . Colon cancer Neg Hx    Social History:  reports that she has never smoked. She has never used smokeless tobacco. She reports that she does not  drink alcohol or use drugs.  Allergies:  Allergies  Allergen Reactions  . Bee Venom Anaphylaxis  . Other Other (See Comments)    Contrast Dye CT chest w/ contrast at OSH followed by SOB resolved w/ single dose steroids, never ENT swelling, intubation or pressors, has had multiple contrasted scans since  . Codeine Itching  . Morphine And Related Other (See Comments)    Unknown reaction- pt gets sick, dizzy, and confused Intolerance, not an allergy, received this in the emergency department recently without a problem.    Medications Prior to Admission  Medication Sig Dispense Refill  . acetaminophen (TYLENOL) 500 MG tablet Take 1,000 mg by mouth every 8 (eight) hours as needed for headache.    . albuterol (PROVENTIL) (2.5 MG/3ML) 0.083% nebulizer solution Take 3 mLs (2.5 mg total) by nebulization every 2 (two) hours as needed for wheezing or shortness of breath. 75 mL 12  . budesonide (PULMICORT) 0.5 MG/2ML nebulizer solution Take 2 mLs (0.5 mg total) by nebulization 2 (two) times daily.  6  . busPIRone (BUSPAR) 5 MG tablet One po TID Prn for anxiety 30 tablet 5  . doxycycline (VIBRA-TABS) 100 MG tablet Take 1 tablet (100 mg total) by mouth 2 (two) times daily. 14 tablet 0  . ferrous  sulfate 325 (65 FE) MG EC tablet Take 325 mg by mouth daily with breakfast.    . levothyroxine (SYNTHROID, LEVOTHROID) 75 MCG tablet Take half tablet on Monday then one tablet all other days 26 tablet 5  . lubiprostone (AMITIZA) 24 MCG capsule Take 1 capsule (24 mcg total) by mouth 2 (two) times daily with a meal. 60 capsule 5  . midodrine (PROAMATINE) 5 MG tablet TAKE 1 TABLET BY MOUTH 2 TIMES DAILY WITH A MEAL. 60 tablet 4  . ondansetron (ZOFRAN-ODT) 8 MG disintegrating tablet DISSOLVE 1 TABLET BY MOUTH EVERY 8 HOURS AS NEEDED FOR NAUSEA OR VOMITING 30 tablet 5  . oxyCODONE-acetaminophen (PERCOCET) 10-325 MG tablet 1 taken 3 times daily as needed pain (Patient taking differently: 3 (three) times daily. 1 taken  3 times daily as needed pain) 90 tablet 0  . pantoprazole (PROTONIX) 40 MG tablet Take 1 tablet (40 mg total) by mouth daily. 90 tablet 3  . predniSONE (DELTASONE) 10 MG tablet Take 1 tablet (10 mg total) by mouth daily with breakfast. 30 tablet 2  . prochlorperazine (COMPAZINE) 10 MG tablet Take 1 tablet (10 mg total) by mouth 2 (two) times daily as needed for nausea or vomiting. 10 tablet 0  . promethazine (PHENERGAN) 12.5 MG tablet TAKE 1 TABLET BY MOUTH EVERY 8 HOURS AS NEEDED FOR NAUSEA OR VOMITING. 15 tablet 3    No results found for this or any previous visit (from the past 48 hour(s)). No results found.  ROS  Blood pressure 121/70, pulse 85, temperature 98.1 F (36.7 C), temperature source Oral, resp. rate 16, height 5\' 10"  (1.778 m), weight 95.3 kg, SpO2 96 %. Physical Exam  Constitutional: She appears well-developed and well-nourished.  HENT:  Mouth/Throat: Oropharynx is clear and moist.  Eyes: Conjunctivae are normal. No scleral icterus.  Neck: No thyromegaly present.  Cardiovascular: Normal rate, regular rhythm and normal heart sounds.  No murmur heard. Respiratory: Effort normal and breath sounds normal.  GI:  Abdomen is full.  Bowel sounds are normal.  On palpation abdomen is soft and nontender with organomegaly or masses.  Musculoskeletal:        General: No edema.  Lymphadenopathy:    She has no cervical adenopathy.  Neurological: She is alert.  Skin: Skin is warm and dry.     Assessment/Plan Chronic nausea and vomiting. Diagnostic EGD.  Hildred Laser, MD 10/17/2018, 10:19 AM

## 2018-10-17 NOTE — Transfer of Care (Signed)
Immediate Anesthesia Transfer of Care Note  Patient: Kathleen [name]  Procedure(s) Performed: ESOPHAGOGASTRODUODENOSCOPY (EGD) WITH PROPOFOL (N/A ) BIOPSY  Patient Location: PACU  Anesthesia Type:MAC  Level of Consciousness: awake and patient cooperative  Airway & Oxygen Therapy: Patient Spontanous Breathing and Patient connected to nasal cannula oxygen  Post-op Assessment: Report given to RN, Post -op Vital signs reviewed and stable and Patient moving all extremities  Post vital signs: Reviewed and stable  Last Vitals:  Vitals Value Taken Time  BP    Temp    Pulse    Resp    SpO2      Last Pain:  Vitals:   10/17/18 1031  TempSrc:   PainSc: 3       Patients Stated Pain Goal: 6 (76/22/63 3354)  Complications: No apparent anesthesia complications

## 2018-10-19 DIAGNOSIS — J849 Interstitial pulmonary disease, unspecified: Secondary | ICD-10-CM | POA: Diagnosis not present

## 2018-10-19 DIAGNOSIS — M6281 Muscle weakness (generalized): Secondary | ICD-10-CM | POA: Diagnosis not present

## 2018-10-19 DIAGNOSIS — J9601 Acute respiratory failure with hypoxia: Secondary | ICD-10-CM | POA: Diagnosis not present

## 2018-10-19 DIAGNOSIS — D860 Sarcoidosis of lung: Secondary | ICD-10-CM | POA: Diagnosis not present

## 2018-10-20 MED FILL — PROMETHAZINE 12.5 MG TABLET: 12.5 | 5 days supply | Qty: 15 | Fill #2

## 2018-10-20 MED FILL — ONDANSETRON ODT 8 MG TABLET: 8 | 10 days supply | Qty: 30 | Fill #3

## 2018-10-20 MED FILL — OXYCODONE-ACETAMINOPHEN 10-: 10-325 | 30 days supply | Qty: 90 | Fill #0

## 2018-10-21 ENCOUNTER — Ambulatory Visit (INDEPENDENT_AMBULATORY_CARE_PROVIDER_SITE_OTHER): Payer: 59 | Admitting: Family Medicine

## 2018-10-21 ENCOUNTER — Encounter: Payer: Self-pay | Admitting: Family Medicine

## 2018-10-21 VITALS — BP 118/72 | Temp 98.8°F | Ht 70.0 in | Wt 218.0 lb

## 2018-10-21 DIAGNOSIS — R112 Nausea with vomiting, unspecified: Secondary | ICD-10-CM | POA: Diagnosis not present

## 2018-10-21 DIAGNOSIS — E271 Primary adrenocortical insufficiency: Secondary | ICD-10-CM

## 2018-10-21 MED ORDER — HYDROCORTISONE 10 MG PO TABS: ORAL_TABLET | ORAL | 0 refills | Status: DC

## 2018-10-21 NOTE — Progress Notes (Signed)
   Subjective:    Patient ID: Kathleen [name], female    DOB: [birth year], 58 y.o.   MRN: [id number]  HPINausea and vomiting for a few months. Getting worse. zofran sometimes helps sometimes not. Phenergan does help. Pt has not filled compazine that was prescribed.  She has a history of Addison's disease She also has a history of underlying lung issues and is on prednisone 10 mg daily She sees a specialist at [facility] for her lungs She has a history of Addison's but has not seen endocrinology in over a year Finds her self feeling rundown fatigued tired nausea frequently some vomiting occasionally but not right now had recent EGD and gastric emptying study which was negative   Review of Systems  Constitutional: Negative for activity change, appetite change and fatigue.  HENT: Negative for congestion.   Respiratory: Negative for cough.   Cardiovascular: Negative for chest pain.  Gastrointestinal: Negative for abdominal pain.  Skin: Negative for color change.  Neurological: Negative for headaches.  Psychiatric/Behavioral: Negative for behavioral problems.       Objective:   Physical Exam Vitals signs reviewed.  Constitutional:      General: She is not in acute distress. HENT:     Head: Normocephalic.  Cardiovascular:     Rate and Rhythm: Normal rate and regular rhythm.     Heart sounds: Normal heart sounds. No murmur.  Pulmonary:     Effort: Pulmonary effort is normal.     Breath sounds: Normal breath sounds.  Lymphadenopathy:     Cervical: No cervical adenopathy.  Neurological:     Mental Status: She is alert.  Psychiatric:        Behavior: Behavior normal.           Assessment & Plan:  Addison's disease Adrenal insufficiency I did speak with her endocrinologist they will see her in March Hydrocortisone 10 mg tablet 1-1/2 tablet in the morning 1 tablet at 1 PM Stop prednisone Comprehensive metabolic profile Follow-up with Dr. Dorris Fetch in March as planned If severe  symptoms vomiting cannot keep things down then immediately go to ER Patient understands the importance of treating this adequately

## 2018-10-21 NOTE — Patient Instructions (Signed)
Keep appointment with Dr Dorris Fetch on March 9  If severe vomiting issues go to ER

## 2018-10-22 ENCOUNTER — Encounter (HOSPITAL_COMMUNITY): Payer: Self-pay | Admitting: Internal Medicine

## 2018-10-24 ENCOUNTER — Other Ambulatory Visit (INDEPENDENT_AMBULATORY_CARE_PROVIDER_SITE_OTHER): Payer: Self-pay | Admitting: *Deleted

## 2018-10-24 DIAGNOSIS — R112 Nausea with vomiting, unspecified: Secondary | ICD-10-CM

## 2018-10-27 DIAGNOSIS — E271 Primary adrenocortical insufficiency: Secondary | ICD-10-CM | POA: Diagnosis not present

## 2018-10-27 DIAGNOSIS — R112 Nausea with vomiting, unspecified: Secondary | ICD-10-CM | POA: Diagnosis not present

## 2018-10-28 LAB — COMPREHENSIVE METABOLIC PANEL
ALT: 17 IU/L (ref 0–32)
AST: 21 IU/L (ref 0–40)
Albumin/Globulin Ratio: 1.7 (ref 1.2–2.2)
Albumin: 4.2 g/dL (ref 3.8–4.9)
Alkaline Phosphatase: 84 IU/L (ref 39–117)
BUN/Creatinine Ratio: 11 (ref 9–23)
BUN: 11 mg/dL (ref 6–24)
Bilirubin Total: 0.3 mg/dL (ref 0.0–1.2)
CO2: 23 mmol/L (ref 20–29)
Calcium: 9.7 mg/dL (ref 8.7–10.2)
Chloride: 101 mmol/L (ref 96–106)
Creatinine, Ser: 0.98 mg/dL (ref 0.57–1.00)
GFR calc Af Amer: 75 mL/min/{1.73_m2} (ref >59)
GFR calc non Af Amer: 65 mL/min/{1.73_m2} (ref >59)
Globulin, Total: 2.5 g/dL (ref 1.5–4.5)
Glucose: 82 mg/dL (ref 65–99)
Potassium: 5 mmol/L (ref 3.5–5.2)
Sodium: 139 mmol/L (ref 134–144)
Total Protein: 6.7 g/dL (ref 6.0–8.5)

## 2018-10-29 ENCOUNTER — Other Ambulatory Visit (HOSPITAL_COMMUNITY): Payer: Self-pay

## 2018-10-29 ENCOUNTER — Ambulatory Visit (INDEPENDENT_AMBULATORY_CARE_PROVIDER_SITE_OTHER): Payer: 59 | Admitting: Family Medicine

## 2018-10-29 VITALS — Temp 98.2°F | Ht 70.0 in | Wt 216.1 lb

## 2018-10-29 DIAGNOSIS — J111 Influenza due to unidentified influenza virus with other respiratory manifestations: Secondary | ICD-10-CM | POA: Diagnosis not present

## 2018-10-29 MED ORDER — OSELTAMIVIR PHOSPHATE 75 MG PO CAPS: 75.0000 mg | ORAL_CAPSULE | Freq: Two times a day (BID) | ORAL | 0 refills | Status: DC

## 2018-10-29 NOTE — Progress Notes (Signed)
   Subjective:    Patient ID: Kathleen [name], female    DOB: 01-Jul-1961, 58 y.o.   MRN: [id number]  HPI  Patient is here today with complaints of dry cough,sore throat,runny nose. Raises into question the possibility of the flu.  Patient relates body aches low-grade fever not feeling good symptoms over the past 36 hours.  Denies vomiting diarrhea but does relate persistent nausea supposed to see specialist coming up for possible Addison's disease issues Ongoing for the last two days.  She has been taking otc mucinex .  Review of Systems  Constitutional: Negative for activity change and fever.  HENT: Positive for congestion and rhinorrhea. Negative for ear pain.   Eyes: Negative for discharge.  Respiratory: Positive for cough. Negative for shortness of breath and wheezing.   Cardiovascular: Negative for chest pain.       Objective:   Physical Exam Vitals signs and nursing note reviewed.  Constitutional:      Appearance: She is well-developed.  HENT:     Head: Normocephalic.     Nose: Nose normal.     Mouth/Throat:     Pharynx: No oropharyngeal exudate.  Neck:     Musculoskeletal: Neck supple.  Cardiovascular:     Rate and Rhythm: Normal rate.     Heart sounds: Normal heart sounds. No murmur.  Pulmonary:     Effort: Pulmonary effort is normal.     Breath sounds: Normal breath sounds. No wheezing.  Lymphadenopathy:     Cervical: No cervical adenopathy.  Skin:    General: Skin is warm and dry.           Assessment & Plan:  Probable influenza based upon symptoms Tamiflu as prescribed Influenza-the patient was diagnosed with influenza. Patient/family educated about the flu and warning signs to watch for. If difficulty breathing,  cyanosis, disorientation, or progressive worsening then immediately get rechecked at the ER. If progressive symptoms be certain to be rechecked. Supportive measures such as Tylenol/ibuprofen was discussed. No aspirin use in children. Patient  not toxic currently no pneumonia heard

## 2018-10-29 NOTE — Patient Instructions (Signed)
Influenza, Adult Influenza, more commonly known as "the flu," is a viral infection that mainly affects the respiratory tract. The respiratory tract includes organs that help you breathe, such as the lungs, nose, and throat. The flu causes many symptoms similar to the common cold along with high fever and body aches. The flu spreads easily from person to person (is contagious). Getting a flu shot (influenza vaccination) every year is the best way to prevent the flu. What are the causes? This condition is caused by the influenza virus. You can get the virus by:  Breathing in droplets that are in the air from an infected person's cough or sneeze.  Touching something that has been exposed to the virus (has been contaminated) and then touching your mouth, nose, or eyes. What increases the risk? The following factors may make you more likely to get the flu:  Not washing or sanitizing your hands often.  Having close contact with many people during cold and flu season.  Touching your mouth, eyes, or nose without first washing or sanitizing your hands.  Not getting a yearly (annual) flu shot. You may have a higher risk for the flu, including serious problems such as a lung infection (pneumonia), if you:  Are older than 65.  Are pregnant.  Have a weakened disease-fighting system (immune system). You may have a weakened immune system if you: ? Have HIV or AIDS. ? Are undergoing chemotherapy. ? Are taking medicines that reduce (suppress) the activity of your immune system.  Have a long-term (chronic) illness, such as heart disease, kidney disease, diabetes, or lung disease.  Have a liver disorder.  Are severely overweight (morbidly obese).  Have anemia. This is a condition that affects your red blood cells.  Have asthma. What are the signs or symptoms? Symptoms of this condition usually begin suddenly and last 4-14 days. They may include:  Fever and chills.  Headaches, body aches, or  muscle aches.  Sore throat.  Cough.  Runny or stuffy (congested) nose.  Chest discomfort.  Poor appetite.  Weakness or fatigue.  Dizziness.  Nausea or vomiting. How is this diagnosed? This condition may be diagnosed based on:  Your symptoms and medical history.  A physical exam.  Swabbing your nose or throat and testing the fluid for the influenza virus. How is this treated? If the flu is diagnosed early, you can be treated with medicine that can help reduce how severe the illness is and how long it lasts (antiviral medicine). This may be given by mouth (orally) or through an IV. Taking care of yourself at home can help relieve symptoms. Your health care provider may recommend:  Taking over-the-counter medicines.  Drinking plenty of fluids. In many cases, the flu goes away on its own. If you have severe symptoms or complications, you may be treated in a hospital. Follow these instructions at home: Activity  Rest as needed and get plenty of sleep.  Stay home from work or school as told by your health care provider. Unless you are visiting your health care provider, avoid leaving home until your fever has been gone for 24 hours without taking medicine. Eating and drinking  Take an oral rehydration solution (ORS). This is a drink that is sold at pharmacies and retail stores.  Drink enough fluid to keep your urine pale yellow.  Drink clear fluids in small amounts as you are able. Clear fluids include water, ice chips, diluted fruit juice, and low-calorie sports drinks.  Eat bland, easy-to-digest foods   in small amounts as you are able. These foods include bananas, applesauce, rice, lean meats, toast, and crackers.  Avoid drinking fluids that contain a lot of sugar or caffeine, such as energy drinks, regular sports drinks, and soda.  Avoid alcohol.  Avoid spicy or fatty foods. General instructions      Take over-the-counter and prescription medicines only as told  by your health care provider.  Use a cool mist humidifier to add humidity to the air in your home. This can make it easier to breathe.  Cover your mouth and nose when you cough or sneeze.  Wash your hands with soap and water often, especially after you cough or sneeze. If soap and water are not available, use alcohol-based hand sanitizer.  Keep all follow-up visits as told by your health care provider. This is important. How is this prevented?   Get an annual flu shot. You may get the flu shot in late summer, fall, or winter. Ask your health care provider when you should get your flu shot.  Avoid contact with people who are sick during cold and flu season. This is generally fall and winter. Contact a health care provider if:  You develop new symptoms.  You have: ? Chest pain. ? Diarrhea. ? A fever.  Your cough gets worse.  You produce more mucus.  You feel nauseous or you vomit. Get help right away if:  You develop shortness of breath or difficulty breathing.  Your skin or nails turn a bluish color.  You have severe pain or stiffness in your neck.  You develop a sudden headache or sudden pain in your face or ear.  You cannot eat or drink without vomiting. Summary  Influenza, more commonly known as "the flu," is a viral infection that primarily affects your respiratory tract.  Symptoms of the flu usually begin suddenly and last 4-14 days.  Getting an annual flu shot is the best way to prevent getting the flu.  Stay home from work or school as told by your health care provider. Unless you are visiting your health care provider, avoid leaving home until your fever has been gone for 24 hours without taking medicine.  Keep all follow-up visits as told by your health care provider. This is important. This information is not intended to replace advice given to you by your health care provider. Make sure you discuss any questions you have with your health care  provider. Document Released: 08/10/2000 Document Revised: 01/29/2018 Document Reviewed: 01/29/2018 Elsevier Interactive Patient Education  2019 Elsevier Inc.  

## 2018-10-31 ENCOUNTER — Ambulatory Visit (INDEPENDENT_AMBULATORY_CARE_PROVIDER_SITE_OTHER): Payer: 59 | Admitting: "Endocrinology

## 2018-10-31 ENCOUNTER — Encounter: Payer: Self-pay | Admitting: "Endocrinology

## 2018-10-31 VITALS — BP 123/74 | HR 72 | Temp 97.4°F | Ht 70.0 in | Wt 220.2 lb

## 2018-10-31 DIAGNOSIS — E039 Hypothyroidism, unspecified: Secondary | ICD-10-CM | POA: Diagnosis not present

## 2018-10-31 DIAGNOSIS — E2740 Unspecified adrenocortical insufficiency: Secondary | ICD-10-CM | POA: Diagnosis not present

## 2018-10-31 MED ORDER — HYDROCORTISONE 10 MG PO TABS: ORAL_TABLET | ORAL | 0 refills | Status: DC

## 2018-10-31 NOTE — Progress Notes (Signed)
Endocrinology follow-up  Note                                            10/31/2018, 2:23 PM   Subjective:    Patient ID: Kathleen [name], female    DOB: [birth year], PCP [name], MD   Past Medical History:  Diagnosis Date  . Adrenal insufficiency ([place])   . Anemia   . Avascular bone necrosis ([place])   . Breast fibrocystic disorder   . Chronic pain   . Fibromyalgia   . Gastroesophageal reflux disease   . Mitral valve prolapse   . Orthostatic hypotension   . Peripheral neuropathy   . Restrictive lung disease    Moderate to severe  . Sarcoidosis    Biopsy proven - [facility]  . SOB (shortness of breath)    chronic   Past Surgical History:  Procedure Laterality Date  . BIOPSY  10/17/2018   Procedure: BIOPSY;  Surgeon: Rogene Houston, MD;  Location: [facility];  Service: Endoscopy;;  duodenum, antrum, gastric body  . BREAST LUMPECTOMY  01/12/2011   right  . ESOPHAGOGASTRODUODENOSCOPY (EGD) WITH PROPOFOL N/A 10/17/2018   Procedure: ESOPHAGOGASTRODUODENOSCOPY (EGD) WITH PROPOFOL;  Surgeon: Rogene Houston, MD;  Location: [facility];  Service: Endoscopy;  Laterality: N/A;  2:25  . TUBAL LIGATION     Social History   Socioeconomic History  . Marital status: Married    Spouse name: Not on file  . Number of children: 1  . Years of education: Not on file  . Highest education level: Not on file  Occupational History  . Occupation: Teacher, early years/pre: [employer]  . Financial resource strain: Not on file  . Food insecurity:    Worry: Not on file    Inability: Not on file  . Transportation needs:    Medical: Not on file    Non-medical: Not on file  Tobacco Use  . Smoking status: Never Smoker  . Smokeless tobacco: Never Used  Substance and Sexual Activity  . Alcohol use: No  . Drug use: No  . Sexual activity: Yes  Lifestyle  . Physical activity:    Days per week: Not on file    Minutes per session: Not on file  . Stress: Not on  file  Relationships  . Social connections:    Talks on phone: Not on file    Gets together: Not on file    Attends religious service: Not on file    Active member of club or organization: Not on file    Attends meetings of clubs or organizations: Not on file    Relationship status: Not on file  Other Topics Concern  . Not on file  Social History Narrative   Daily caffeine    Family History  Problem Relation Age of Onset  . Cardiomyopathy Mother   . Breast cancer Mother   . Prostate cancer Father   . Neurofibromatosis Brother   . Heart disease Maternal Aunt   . Bipolar disorder Daughter   . Colon cancer Neg Hx    Outpatient Encounter Medications as of 10/31/2018  Medication Sig  . acetaminophen (TYLENOL) 500 MG tablet Take 1,000 mg by mouth every 8 (eight) hours as needed for headache.  . albuterol (PROVENTIL) (2.5 MG/3ML) 0.083% nebulizer solution  Take 3 mLs (2.5 mg total) by nebulization every 2 (two) hours as needed for wheezing or shortness of breath.  . budesonide (PULMICORT) 0.5 MG/2ML nebulizer solution Take 2 mLs (0.5 mg total) by nebulization 2 (two) times daily.  . busPIRone (BUSPAR) 5 MG tablet One po TID Prn for anxiety  . ferrous sulfate 325 (65 FE) MG EC tablet Take 325 mg by mouth daily with breakfast.  . hydrocortisone (CORTEF) 10 MG tablet 2  tablets at 8 AM and 1 tablet at 1PM  . levothyroxine (SYNTHROID, LEVOTHROID) 75 MCG tablet Take half tablet on Monday then one tablet all other days  . lubiprostone (AMITIZA) 24 MCG capsule Take 1 capsule (24 mcg total) by mouth 2 (two) times daily with a meal.  . midodrine (PROAMATINE) 5 MG tablet TAKE 1 TABLET BY MOUTH 2 TIMES DAILY WITH A MEAL.  Marland Kitchen ondansetron (ZOFRAN-ODT) 8 MG disintegrating tablet DISSOLVE 1 TABLET BY MOUTH EVERY 8 HOURS AS NEEDED FOR NAUSEA OR VOMITING  . oseltamivir (TAMIFLU) 75 MG capsule Take 1 capsule (75 mg total) by mouth 2 (two) times daily.  Marland Kitchen oxyCODONE-acetaminophen (PERCOCET) 10-325 MG tablet 1  taken 3 times daily as needed pain (Patient taking differently: 3 (three) times daily. 1 taken 3 times daily as needed pain)  . pantoprazole (PROTONIX) 40 MG tablet Take 1 tablet (40 mg total) by mouth daily.  . predniSONE (DELTASONE) 10 MG tablet Take 1 tablet (10 mg total) by mouth daily with breakfast.  . prochlorperazine (COMPAZINE) 10 MG tablet Take 1 tablet (10 mg total) by mouth 2 (two) times daily as needed for nausea or vomiting.  . promethazine (PHENERGAN) 12.5 MG tablet TAKE 1 TABLET BY MOUTH EVERY 8 HOURS AS NEEDED FOR NAUSEA OR VOMITING.  . [DISCONTINUED] hydrocortisone (CORTEF) 10 MG tablet 1.5 tablets at 8am and 1 tablet at 1pm   No facility-administered encounter medications on file as of 10/31/2018.    ALLERGIES: Allergies  Allergen Reactions  . Bee Venom Anaphylaxis  . Other Other (See Comments)    Contrast Dye CT chest w/ contrast at OSH followed by SOB resolved w/ single dose steroids, never ENT swelling, intubation or pressors, has had multiple contrasted scans since  . Codeine Itching  . Morphine And Related Other (See Comments)    Unknown reaction- pt gets sick, dizzy, and confused Intolerance, not an allergy, received this in the emergency department recently without a problem.    VACCINATION STATUS: Immunization History  Administered Date(s) Administered  . Influenza Whole 06/27/2010, 07/28/2011  . Pneumococcal Conjugate-13 06/15/2014  . Pneumococcal Polysaccharide-23 06/10/2013    HPI Kathleen [name] is 58 y.o. female who presents today with a medical history as above.  -After missing her previous appointments for unclear reasons, she is returning for follow-up of her history of adrenal insufficiency and hypothyroidism.    - Her history is complicated including sarcoidosis which she dealt with for more than the last 10 years.  She is on regular follow-up with her pulmonologist.  Over the years, her therapy included high-dose steroids, up to 60 mg daily of  prednisone, for the last 4-6 years.  She is currently on maintenance dose of prednisone 10 mg p.o. daily. - Last year,   she was hospitalized for what appears to be septic shock. She required mechanical ventilation for respiratory failure and was transferred to [facility] for pulmonary care.  - After that episode she was diagnosed with adrenal insufficiency.  She was seen in this clinic for 3 visits  when it was emphasized for her the absolute necessity of staying on prednisone treatment, educated on sick day rules, and maintain follow-up appointments.  However she disappeared from clinic.  She appeared in her PMD office last week with complaints of fatigue, nausea, or vomiting.  She was stable enough to be managed as an outpatient.  Hydrocortisone 50 mg a.m. and 10 mg at noon was added.    She avoided hospitalization, she still complains of the same symptoms.  She thinks she may be losing her medications when she is vomiting including her thyroid hormone.  -She has significant cognitive deficit, may be forgetting her medications at times. -Her weight is more or less the same over the last several visits.  -She was seen for febrile illness in her PMD office recently.  - The details of her diagnosis with adrenal insufficiency  is not available to review. -Despite previous recommendation, she still does not wear  medic alert. She denies any history of tuberculosis, incarceration, nor overseas travel . -She denies family history of adrenal, pituitary dysfunction. She has family history of hypothyroidism in one sibling. - Denies any history of intra-abdominal injury. She is on hydrocodone for chronic pain and inhaled bronchodilators. She is known to have orthostatic hypotension for which she is on midodrine- currently 5 minute grams by mouth twice a day.   Review of Systems  Constitutional:  + Steady weight lately, + fatigue, no subjective hyperthermia, no subjective hypothermia Eyes: no blurry vision,  no xerophthalmia ENT: no sore throat, no nodules palpated in throat, no dysphagia/odynophagia, no hoarseness Cardiovascular: no Chest Pain, no Shortness of Breath, no palpitations, no leg swelling Respiratory: no cough, no shortness of breath Gastrointestinal: no Nausea/Vomiting/Diarhhea Musculoskeletal: no muscle/joint aches Skin: no rashes Neurological: no tremors, no numbness, no tingling, no dizziness Psychiatric: no depression, no anxiety  Objective:    BP 123/74   Pulse 72   Temp (!) 97.4 F (36.3 C)   Ht 5\' 10"  (1.778 m)   Wt 220 lb 3.2 oz (99.9 kg)   SpO2 98%   BMI 31.60 kg/m   Wt Readings from Last 3 Encounters:  10/31/18 220 lb 3.2 oz (99.9 kg)  10/29/18 216 lb 0.8 oz (98 kg)  10/21/18 218 lb (98.9 kg)    Physical Exam  Constitutional: +Obese for height, not in acute distress, + significant cognitive deficit.   Eyes: PERRLA, EOMI, no exophthalmos ENT: moist mucous membranes, no gross thyromegaly, no gross cervical lymphadenopathy Musculoskeletal: no gross deformities, strength intact in all four extremities Skin: moist, warm, no rashes Neurological: no tremor with outstretched hands, Deep tendon reflexes normal in bilateral lower extremities.  CMP ( most recent) CMP     Component Value Date/Time   NA 139 10/27/2018 1608   K 5.0 10/27/2018 1608   CL 101 10/27/2018 1608   CO2 23 10/27/2018 1608   GLUCOSE 82 10/27/2018 1608   GLUCOSE 96 11/05/2017 0606   BUN 11 10/27/2018 1608   CREATININE 0.98 10/27/2018 1608   CREATININE 0.85 08/26/2014 0831   CALCIUM 9.7 10/27/2018 1608   PROT 6.7 10/27/2018 1608   ALBUMIN 4.2 10/27/2018 1608   AST 21 10/27/2018 1608   ALT 17 10/27/2018 1608   ALKPHOS 84 10/27/2018 1608   BILITOT 0.3 10/27/2018 1608   GFRNONAA 65 10/27/2018 1608   GFRAA 75 10/27/2018 1608     Diabetic Labs (most recent): Lab Results  Component Value Date   HGBA1C 5.0 12/31/2016   HGBA1C 5.3 08/21/2016  Lipid Panel ( most recent) Lipid  Panel     Component Value Date/Time   CHOL 259 (H) 02/20/2018 1346   TRIG 380 (H) 02/20/2018 1346   HDL 52 02/20/2018 1346   CHOLHDL 5.0 (H) 02/20/2018 1346   CHOLHDL 4.0 08/26/2014 0831   VLDL 31 08/26/2014 0831   LDLCALC 131 (H) 02/20/2018 1346      Lab Results  Component Value Date   TSH 1.290 08/07/2018   TSH 2.280 04/17/2018   TSH 0.527 12/04/2017   TSH 0.242 (L) 10/22/2017   TSH 0.769 10/15/2017   TSH 0.545 08/09/2017   TSH 5.190 (H) 04/19/2017   TSH 4.950 (H) 12/31/2016   TSH 0.463 08/18/2016   TSH 0.412 01/01/2016   FREET4 1.38 08/09/2017   FREET4 1.15 04/19/2017   FREET4 0.99 12/31/2016      Assessment & Plan:   1. Adrenal insufficiency ([place]) -I have reviewed her available records and clinically evaluated this patient. She does have multiple risk factors for adrenal insufficiency including exposure to high-dose steroids related to her sarcoidosis, as well as opioid therapy related to diffuse pain. -  She has taken prednisone up to 60 mg daily for up to 6 years prior to her diagnosis with adrenal insufficiency. -She is on a stable dose of prednisone 10 mg p.o. daily related related to her sarcoidosis. -Given her recent symptoms indicative of adequate steroid replacement, hydrocortisone was added.   -Until she becomes stable, I have recommended to increase hydrocortisone to 20 mg every 8 AM, and 10 mg every 1 PM daily.  This is in addition to her ongoing prednisone 10 mg p.o. daily.  -She is also advised to wear medical alert affecting her diagnosis and need for steroids.   -She will not be considered for work-up by withdrawal state supports, risk of adrenal crisis.  -She is advised to go to ER if she cannot keep her medications down due to intractable vomiting. - She will be evaluated for the need for mineralocorticoid replacement which may help to stabilize her blood pressure. She is currently on medodrine 5 mg by mouth twice a day. - I had a long discussion  with her regarding steroids sick day rules, risk of weight gain/diabetes and the need for periodic screening with A1c.  -Her last A1c was 5% indicating absence of prediabetes/diabetes.    2. Hypothyroidism:  - She may be losing her thyroid hormone during vomiting.  She does not have recent thyroid function test to review.  She is advised to continue levothyroxine 75 mcg p.o. every morning.  She will have repeat thyroid function tests on subsequent visits.    - We discussed about the correct intake of her thyroid hormone, on empty stomach at fasting, with water, separated by at least 30 minutes from breakfast and other medications,  and separated by more than 4 hours from calcium, iron, multivitamins, acid reflux medications (PPIs). -Patient is made aware of the fact that thyroid hormone replacement is needed for life, dose to be adjusted by periodic monitoring of thyroid function tests.   - I advised her  to maintain close follow up with [name], MD for primary care needs.  Kathleen [name] participated in the discussions, expressed understanding, and voiced agreement with the above plans.  All questions were answered to her satisfaction. she is encouraged to contact clinic should she have any questions or concerns prior to her return visit.  Follow up plan: Return in about 3 months (around 01/31/2019)  for Follow up with Pre-visit Labs.   Glade Lloyd, MD [facility] [address] [place], [place] [address] Phone: [phone]  Fax: [phone]     10/31/2018, 2:23 PM  This note was partially dictated with voice recognition software. Similar sounding words can be transcribed inadequately or may not  be corrected upon review.

## 2018-10-31 NOTE — Progress Notes (Signed)
Kathleen [name], CMA  

## 2018-11-02 ENCOUNTER — Emergency Department (HOSPITAL_COMMUNITY)
Admission: EM | Admit: 2018-11-02 | Discharge: 2018-11-02 | Disposition: A | Discharge status: Home / Self Care | Payer: 59 | Attending: Emergency Medicine | Admitting: Emergency Medicine

## 2018-11-02 ENCOUNTER — Other Ambulatory Visit: Payer: Self-pay

## 2018-11-02 ENCOUNTER — Encounter (HOSPITAL_COMMUNITY): Payer: Self-pay | Admitting: *Deleted

## 2018-11-02 ENCOUNTER — Emergency Department (HOSPITAL_COMMUNITY): Payer: 59

## 2018-11-02 DIAGNOSIS — Z79899 Other long term (current) drug therapy: Secondary | ICD-10-CM | POA: Diagnosis not present

## 2018-11-02 DIAGNOSIS — R112 Nausea with vomiting, unspecified: Secondary | ICD-10-CM | POA: Insufficient documentation

## 2018-11-02 DIAGNOSIS — J111 Influenza due to unidentified influenza virus with other respiratory manifestations: Secondary | ICD-10-CM | POA: Diagnosis not present

## 2018-11-02 DIAGNOSIS — R05 Cough: Secondary | ICD-10-CM | POA: Diagnosis not present

## 2018-11-02 LAB — CBC WITH DIFFERENTIAL/PLATELET
Abs Immature Granulocytes: 0.06 10*3/uL (ref 0.00–0.07)
Basophils Absolute: 0.1 10*3/uL (ref 0.0–0.1)
Basophils Relative: 1 %
Eosinophils Absolute: 0.3 10*3/uL (ref 0.0–0.5)
Eosinophils Relative: 5 %
HCT: 36.9 % (ref 36.0–46.0)
Hemoglobin: 11.9 g/dL — ABNORMAL LOW (ref 12.0–15.0)
Immature Granulocytes: 1 %
Lymphocytes Relative: 13 %
Lymphs Abs: 0.9 10*3/uL (ref 0.7–4.0)
MCH: 28.5 pg (ref 26.0–34.0)
MCHC: 32.2 g/dL (ref 30.0–36.0)
MCV: 88.3 fL (ref 80.0–100.0)
Monocytes Absolute: 0.7 10*3/uL (ref 0.1–1.0)
Monocytes Relative: 10 %
Neutro Abs: 4.7 10*3/uL (ref 1.7–7.7)
Neutrophils Relative %: 70 %
Platelets: 342 10*3/uL (ref 150–400)
RBC: 4.18 MIL/uL (ref 3.87–5.11)
RDW: 14.4 % (ref 11.5–15.5)
WBC: 6.7 10*3/uL (ref 4.0–10.5)
nRBC: 0 % (ref 0.0–0.2)

## 2018-11-02 LAB — BASIC METABOLIC PANEL
Anion gap: 7 (ref 5–15)
BUN: 9 mg/dL (ref 6–20)
CO2: 25 mmol/L (ref 22–32)
Calcium: 9.4 mg/dL (ref 8.9–10.3)
Chloride: 106 mmol/L (ref 98–111)
Creatinine, Ser: 0.97 mg/dL (ref 0.44–1.00)
GFR calc Af Amer: >60 mL/min (ref >60)
GFR calc non Af Amer: >60 mL/min (ref >60)
Glucose, Bld: 90 mg/dL (ref 70–99)
Potassium: 3.8 mmol/L (ref 3.5–5.1)
Sodium: 138 mmol/L (ref 135–145)

## 2018-11-02 MED ORDER — HYDROCORTISONE NA SUCCINATE PF 100 MG IJ SOLR
100.0000 mg | INTRAMUSCULAR | Status: AC
  Administered 2018-11-02: 100 mg via INTRAVENOUS
  Filled 2018-11-02: qty 2

## 2018-11-02 MED ORDER — ONDANSETRON HCL 4 MG/2ML IJ SOLN
4.0000 mg | Freq: Once | INTRAMUSCULAR | Status: AC
  Administered 2018-11-02: 4 mg via INTRAVENOUS
  Filled 2018-11-02: qty 2

## 2018-11-02 MED ORDER — SODIUM CHLORIDE 0.9 % IV BOLUS
1000.0000 mL | Freq: Once | INTRAVENOUS | Status: AC
  Administered 2018-11-02: 1000 mL via INTRAVENOUS

## 2018-11-02 MED ORDER — ONDANSETRON HCL 4 MG PO TABS: 4.0000 mg | ORAL_TABLET | Freq: Four times a day (QID) | ORAL | 0 refills | Status: DC | PRN

## 2018-11-02 NOTE — ED Provider Notes (Signed)
[facility] Provider Note   CSN: [id number] Arrival date & time: 11/02/18  0923    History   Chief Complaint Chief Complaint  Patient presents with  . Cough    HPI Kathleen [name] is a 58 y.o. female.     Patient presents to the emergency department for evaluation of flu like illness.  Patient reports that she has been sick for approximately a week.  She was diagnosed with the flu by her doctor.  Patient reports that she has had nausea and vomiting through the course of the day yesterday, is concerned that she has not been holding down her medications.  She has a history of adrenal insufficiency.  Patient is not experiencing any abdominal pain.     Past Medical History:  Diagnosis Date  . Adrenal insufficiency ([place])   . Anemia   . Avascular bone necrosis ([place])   . Breast fibrocystic disorder   . Chronic pain   . Fibromyalgia   . Gastroesophageal reflux disease   . Mitral valve prolapse   . Orthostatic hypotension   . Peripheral neuropathy   . Restrictive lung disease    Moderate to severe  . Sarcoidosis    Biopsy proven - [facility]  . SOB (shortness of breath)    chronic    Patient Active Problem List   Diagnosis Date Noted  . Non-intractable vomiting 09/22/2018  . Adrenal insufficiency (Addison's disease) ([place])   . Nausea vomiting and diarrhea   . Abdominal pain 11/04/2017  . Dyspnea 10/21/2017  . Anemia 10/21/2017  . Influenza A 10/16/2017  . Acute adrenal crisis ([place]) 10/15/2017  . Gastroesophageal reflux disease 10/15/2017  . Personal history of noncompliance with medical treatment, presenting hazards to health 04/26/2017  . Hypothyroidism 01/15/2017  . Septic shock ([place])   . Elevated troponin   . Symptomatic bradycardia   . Acute respiratory failure with hypoxia ([place])   . Adrenal insufficiency ([place]) 08/17/2016  . Hypokalemia 08/17/2016  . Chronic pain syndrome 04/20/2016  . Vaginal bleeding 01/02/2016  . Acute blood loss anemia  12/31/2015  . Gastroenteritis   . Hypovolemic shock ([place]) 12/30/2015  . Nausea and vomiting 12/29/2015  . Hypotension 12/29/2015  . Dehydration 12/28/2015  . Constipation 11/14/2015  . Avascular necrosis of bone of left hip ([place]) 04/05/2015  . Bimalleolar fracture of right ankle 02/11/2015  . Post herpetic neuralgia 10/22/2014  . Hyperlipidemia 08/25/2014  . Obstructive sleep apnea 02/18/2014  . Obesity, unspecified 05/06/2013  . Fibromyalgia 02/11/2013  . Depression 02/11/2013  . Abdominal pain, other specified site 02/11/2013  . Shingles 02/02/2013  . Urinary retention 02/02/2013  . Pedal edema 01/22/2013  . Orthostatic hypotension 01/16/2013  . Disturbance of skin sensation 11/26/2012  . Pain in limb 11/26/2012  . Helicobacter pylori gastritis 05/23/2012  . Cough 05/02/2012  . Fibrocystic breast disease in female 04/09/2012  . DOE (dyspnea on exertion) 08/03/2011  . Anxiety state 10/19/2010  . CHEST PAIN 11/22/2008  . Sarcoidosis 06/21/2008    Past Surgical History:  Procedure Laterality Date  . BIOPSY  10/17/2018   Procedure: BIOPSY;  Surgeon: Rogene Houston, MD;  Location: [facility];  Service: Endoscopy;;  duodenum, antrum, gastric body  . BREAST LUMPECTOMY  01/12/2011   right  . ESOPHAGOGASTRODUODENOSCOPY (EGD) WITH PROPOFOL N/A 10/17/2018   Procedure: ESOPHAGOGASTRODUODENOSCOPY (EGD) WITH PROPOFOL;  Surgeon: Rogene Houston, MD;  Location: [facility];  Service: Endoscopy;  Laterality: N/A;  2:25  . TUBAL LIGATION  OB History    Gravida  1   Para  1   Term  1   Preterm      AB      Living        SAB      TAB      Ectopic      Multiple      Live Births               Home Medications    Prior to Admission medications   Medication Sig Start Date End Date Taking? Authorizing Provider  acetaminophen (TYLENOL) 500 MG tablet Take 1,000 mg by mouth every 8 (eight) hours as needed for headache.   Yes [provider]    albuterol (PROVENTIL) (2.5 MG/3ML) 0.083% nebulizer solution Take 3 mLs (2.5 mg total) by nebulization every 2 (two) hours as needed for wheezing or shortness of breath. 02/05/18  Yes Luking, Scott A, MD  budesonide (PULMICORT) 0.5 MG/2ML nebulizer solution Take 2 mLs (0.5 mg total) by nebulization 2 (two) times daily. 02/05/18  Yes Luking, Elayne Snare, MD  ferrous sulfate 325 (65 FE) MG EC tablet Take 325 mg by mouth daily with breakfast.   Yes [provider]  hydrocortisone (CORTEF) 10 MG tablet 2  tablets at 8 AM and 1 tablet at 1PM 10/31/18  Yes Nida, Marella Chimes, MD  levothyroxine (SYNTHROID, LEVOTHROID) 75 MCG tablet Take half tablet on Monday then one tablet all other days 02/05/18  Yes Luking, Elayne Snare, MD  lubiprostone (AMITIZA) 24 MCG capsule Take 1 capsule (24 mcg total) by mouth 2 (two) times daily with a meal. 04/17/18  Yes Luking, Scott A, MD  midodrine (PROAMATINE) 5 MG tablet TAKE 1 TABLET BY MOUTH 2 TIMES DAILY WITH A MEAL. 02/05/18  Yes Luking, Scott A, MD  ondansetron (ZOFRAN-ODT) 8 MG disintegrating tablet DISSOLVE 1 TABLET BY MOUTH EVERY 8 HOURS AS NEEDED FOR NAUSEA OR VOMITING 08/07/18  Yes Luking, Elayne Snare, MD  oxyCODONE-acetaminophen (PERCOCET) 10-325 MG tablet 1 taken 3 times daily as needed pain Patient taking differently: 3 (three) times daily. 1 taken 3 times daily as needed pain 08/14/18  Yes Luking, Scott A, MD  pantoprazole (PROTONIX) 40 MG tablet Take 1 tablet (40 mg total) by mouth daily. 02/05/18  Yes [name], MD  predniSONE (DELTASONE) 10 MG tablet Take 1 tablet (10 mg total) by mouth daily with breakfast. 10/22/17  Yes Isaac Bliss, Rayford Halsted, MD  promethazine (PHENERGAN) 12.5 MG tablet TAKE 1 TABLET BY MOUTH EVERY 8 HOURS AS NEEDED FOR NAUSEA OR VOMITING. 10/01/18  Yes [name], MD  busPIRone (BUSPAR) 5 MG tablet One po TID Prn for anxiety 02/05/18   [name], MD  ondansetron (ZOFRAN) 4 MG tablet Take 1 tablet (4 mg total) by mouth every 6  (six) hours as needed for nausea or vomiting. 11/02/18   Orpah Greek, MD  oseltamivir (TAMIFLU) 75 MG capsule Take 1 capsule (75 mg total) by mouth 2 (two) times daily. 10/29/18   [name], MD  prochlorperazine (COMPAZINE) 10 MG tablet Take 1 tablet (10 mg total) by mouth 2 (two) times daily as needed for nausea or vomiting. 09/10/18   Nat Christen, MD    Family History Family History  Problem Relation Age of Onset  . Cardiomyopathy Mother   . Breast cancer Mother   . Prostate cancer Father   . Neurofibromatosis Brother   . Heart disease Maternal Aunt   .  Bipolar disorder Daughter   . Colon cancer Neg Hx     Social History Social History   Tobacco Use  . Smoking status: Never Smoker  . Smokeless tobacco: Never Used  Substance Use Topics  . Alcohol use: No  . Drug use: No     Allergies   Bee venom; Other; Codeine; and Morphine and related   Review of Systems Review of Systems  Respiratory: Positive for cough.   Gastrointestinal: Positive for nausea and vomiting.  All other systems reviewed and are negative.    Physical Exam Updated Vital Signs BP 109/66   Pulse 74   Temp 98.3 F (36.8 C) (Oral)   Resp 20   Ht 5\' 10"  (1.778 m)   Wt 99 kg   SpO2 96%   BMI 31.32 kg/m   Physical Exam Vitals signs and nursing note reviewed.  Constitutional:      General: She is not in acute distress.    Appearance: Normal appearance. She is well-developed.  HENT:     Head: Normocephalic and atraumatic.     Right Ear: Hearing normal.     Left Ear: Hearing normal.     Nose: Nose normal.  Eyes:     Conjunctiva/sclera: Conjunctivae normal.     Pupils: Pupils are equal, round, and reactive to light.  Neck:     Musculoskeletal: Normal range of motion and neck supple.  Cardiovascular:     Rate and Rhythm: Regular rhythm.     Heart sounds: S1 normal and S2 normal. No murmur. No friction rub. No gallop.   Pulmonary:     Effort: Pulmonary effort is normal. No  respiratory distress.     Breath sounds: Normal breath sounds.  Chest:     Chest wall: No tenderness.  Abdominal:     General: Bowel sounds are normal.     Palpations: Abdomen is soft.     Tenderness: There is no abdominal tenderness. There is no guarding or rebound. Negative signs include Murphy's sign and McBurney's sign.     Hernia: No hernia is present.  Musculoskeletal: Normal range of motion.  Skin:    General: Skin is warm and dry.     Findings: No rash.  Neurological:     Mental Status: She is alert and oriented to person, place, and time.     GCS: GCS eye subscore is 4. GCS verbal subscore is 5. GCS motor subscore is 6.     Cranial Nerves: No cranial nerve deficit.     Sensory: No sensory deficit.     Coordination: Coordination normal.  Psychiatric:        Speech: Speech normal.        Behavior: Behavior normal.        Thought Content: Thought content normal.      ED Treatments / Results  Labs (all labs ordered are listed, but only abnormal results are displayed) Labs Reviewed  CBC WITH DIFFERENTIAL/PLATELET - Abnormal; Notable for the following components:      Result Value   Hemoglobin 11.9 (*)    All other components within normal limits  BASIC METABOLIC PANEL    EKG None  Radiology No results found.  Procedures Procedures (including critical care time)  Medications Ordered in ED Medications  sodium chloride 0.9 % bolus 1,000 mL (1,000 mLs Intravenous New Bag/Given 11/02/18 0610)  ondansetron (ZOFRAN) injection 4 mg (4 mg Intravenous Given 11/02/18 0611)  hydrocortisone sodium succinate (SOLU-CORTEF) 100 MG injection 100 mg (100 mg  Intravenous Given 11/02/18 0375)     Initial Impression / Assessment and Plan / ED Course  I have reviewed the triage vital signs and the nursing notes.  Pertinent labs & imaging results that were available during my care of the patient were reviewed by me and considered in my medical decision making (see chart for  details).        Patient presents to the emergency department with concerns over nausea and vomiting.  Patient has been diagnosed with the flu earlier this week.  She is concerned tonight because she has not been able to hold down her meds and this includes the hydrocortisone that she takes for adrenal insufficiency.  Patient appears well at arrival.  She does not clinically appear to be significantly dehydrated.  Labs are unremarkable.  Patient's vitals are normal, no tachycardia, no hypotension.  Patient administered hydrocortisone 100 mg IV.  She was given IV fluids and Zofran, will be appropriate for discharge with antiemetics, continue her normal stress dose steroids at home, follow-up as needed.  Final Clinical Impressions(s) / ED Diagnoses   Final diagnoses:  Influenza  Non-intractable vomiting with nausea, unspecified vomiting type    ED Discharge Orders         Ordered    ondansetron (ZOFRAN) 4 MG tablet  Every 6 hours PRN     11/02/18 0707           Orpah Greek, MD 11/02/18 [phone]

## 2018-11-02 NOTE — ED Triage Notes (Signed)
Pt c/o n/v, cough that is productive with green sputum that started after being diagnosed with the flu last week,

## 2018-11-03 ENCOUNTER — Ambulatory Visit: Payer: 59 | Admitting: "Endocrinology

## 2018-11-05 MED FILL — ONDANSETRON ODT 8 MG TABLET: 8 | 10 days supply | Qty: 30 | Fill #4

## 2018-11-05 MED FILL — PROMETHAZINE 12.5 MG TABLET: 12.5 | 5 days supply | Qty: 15 | Fill #3

## 2018-11-12 ENCOUNTER — Ambulatory Visit (INDEPENDENT_AMBULATORY_CARE_PROVIDER_SITE_OTHER): Payer: 59 | Admitting: Family Medicine

## 2018-11-12 ENCOUNTER — Encounter: Payer: Self-pay | Admitting: Family Medicine

## 2018-11-12 ENCOUNTER — Ambulatory Visit: Payer: 59 | Admitting: Family Medicine

## 2018-11-12 ENCOUNTER — Other Ambulatory Visit: Payer: Self-pay

## 2018-11-12 VITALS — BP 140/82 | Temp 97.4°F | Wt 221.4 lb

## 2018-11-12 DIAGNOSIS — G894 Chronic pain syndrome: Secondary | ICD-10-CM | POA: Diagnosis not present

## 2018-11-12 DIAGNOSIS — R112 Nausea with vomiting, unspecified: Secondary | ICD-10-CM | POA: Diagnosis not present

## 2018-11-12 DIAGNOSIS — E2740 Unspecified adrenocortical insufficiency: Secondary | ICD-10-CM

## 2018-11-12 MED ORDER — OXYCODONE-ACETAMINOPHEN 10-325 MG PO TABS: ORAL_TABLET | ORAL | 0 refills | Status: DC

## 2018-11-12 MED ORDER — PROMETHAZINE HCL 25 MG PO TABS: ORAL_TABLET | ORAL | 3 refills | Status: DC

## 2018-11-12 MED ORDER — PANTOPRAZOLE SODIUM 40 MG PO TBEC: 40.0000 mg | DELAYED_RELEASE_TABLET | Freq: Every day | ORAL | 3 refills | Status: DC

## 2018-11-12 MED ORDER — LUBIPROSTONE 24 MCG PO CAPS: 24.0000 ug | ORAL_CAPSULE | Freq: Two times a day (BID) | ORAL | 5 refills | Status: DC

## 2018-11-12 MED FILL — PROMETHAZINE 25 MG TABLET: 25 | 8 days supply | Qty: 25 | Fill #0

## 2018-11-12 MED FILL — PANTOPRAZOLE SOD DR 40 MG T: 40 | 90 days supply | Qty: 90 | Fill #0

## 2018-11-12 MED FILL — AMITIZA 24 MCG CAPSULES: 24 | 30 days supply | Qty: 60 | Fill #0

## 2018-11-12 NOTE — Progress Notes (Signed)
Subjective:    Patient ID: Kathleen [name], female    DOB: 04-27-1961, 58 y.o.   MRN: [id number]  HPI Pt here today for nausea. Pt states she has had nausea for several weeks. Pt has also been vomiting in the evenings. Able to keep food and water down. Pt has tried Zofran and Phenergan with no relief   Pt would also like refill Amitiza and pain medication.   This patient was seen today for chronic pain  The medication list was reviewed and updated.   -Compliance with medication: Patient relates compliance  - Number patient states they take daily: Takes 2 or 3 daily  -when was the last dose patient took?  1 earlier today  The patient was advised the importance of maintaining medication and not using illegal substances with these.  Here for refills and follow up  The patient was educated that we can provide 3 monthly scripts for their medication, it is their responsibility to follow the instructions.  Side effects or complications from medications: Denies any side effects states medication does do a good job at helping her with pain and discomfort  Patient is aware that pain medications are meant to minimize the severity of the pain to allow their pain levels to improve to allow for better function. They are aware of that pain medications cannot totally remove their pain.  Due for UDT ( at least once per year) : No urine drug test indicated today  Patient with nausea toward the end of the day for which medication does help she is frustrated by her persistent nausea she is seen gastroenterology who told her from their perspective everything was okay  Has seen endocrinology that recommended adjustment of her hydrocortisone for Addison's she will follow-up with them in 3 months    Review of Systems  Constitutional: Negative for activity change and appetite change.  HENT: Negative for congestion and rhinorrhea.   Respiratory: Negative for cough and shortness of breath.    Cardiovascular: Negative for chest pain and leg swelling.  Gastrointestinal: Negative for abdominal pain, nausea and vomiting.  Skin: Negative for color change.  Neurological: Negative for dizziness and weakness.  Psychiatric/Behavioral: Negative for agitation and confusion.       Objective:   Physical Exam Vitals signs reviewed.  Constitutional:      General: She is not in acute distress. HENT:     Head: Normocephalic and atraumatic.  Eyes:     General:        Right eye: No discharge.        Left eye: No discharge.  Neck:     Trachea: No tracheal deviation.  Cardiovascular:     Rate and Rhythm: Normal rate and regular rhythm.     Heart sounds: Normal heart sounds. No murmur.  Pulmonary:     Effort: Pulmonary effort is normal. No respiratory distress.     Breath sounds: Normal breath sounds.  Lymphadenopathy:     Cervical: No cervical adenopathy.  Skin:    General: Skin is warm and dry.  Neurological:     Mental Status: She is alert.     Coordination: Coordination normal.  Psychiatric:        Behavior: Behavior normal.           Assessment & Plan:  The patient was seen in followup for chronic pain. A review over at their current pain status was discussed. Drug registry was checked. Prescriptions were given. Discussion was held regarding the  importance of compliance with medication as well as pain medication contract.  Time for questions regarding pain management plan occurred. Importance of regular followup visits was discussed. Patient was informed that medication may cause drowsiness and should not be combined  with other medications/alcohol or street drugs. Patient was cautioned that medication could cause drowsiness. If the patient feels medication is causing altered alertness then do not drive or operate dangerous equipment.  Addison's disease seemingly adequately controlled to follow-up with endocrinology every 3 months recommend follow-up sooner with [place]  for any other problems warning signs discussed  I told her if she kept having nausea and vomiting to also reconnect with gastroenterology and reconnect with endocrinology to see if they have any other suggestions for her  Follow-up in 3 months

## 2018-11-13 MED FILL — LEVOTHYROXINE 75 MCG TABLET: 75 | 30 days supply | Qty: 30 | Fill #4

## 2018-11-17 DIAGNOSIS — J9601 Acute respiratory failure with hypoxia: Secondary | ICD-10-CM | POA: Diagnosis not present

## 2018-11-17 DIAGNOSIS — M6281 Muscle weakness (generalized): Secondary | ICD-10-CM | POA: Diagnosis not present

## 2018-11-17 DIAGNOSIS — D860 Sarcoidosis of lung: Secondary | ICD-10-CM | POA: Diagnosis not present

## 2018-11-17 DIAGNOSIS — J849 Interstitial pulmonary disease, unspecified: Secondary | ICD-10-CM | POA: Diagnosis not present

## 2018-11-18 MED FILL — OXYCODONE-APAP 10-325: 10-325 | 30 days supply | Qty: 90 | Fill #0

## 2018-11-19 ENCOUNTER — Ambulatory Visit: Payer: 59 | Admitting: Family Medicine

## 2018-11-21 ENCOUNTER — Other Ambulatory Visit: Payer: Self-pay | Admitting: Family Medicine

## 2018-11-21 NOTE — Telephone Encounter (Signed)
Left message to return call 

## 2018-11-24 ENCOUNTER — Other Ambulatory Visit: Payer: Self-pay

## 2018-11-24 MED ORDER — HYDROCORTISONE 10 MG PO TABS: ORAL_TABLET | ORAL | 2 refills | Status: DC

## 2018-11-26 MED FILL — MIDODRINE HCL 5 MG TABLET: 5 | 30 days supply | Qty: 60 | Fill #1

## 2018-12-02 MED FILL — ONDANSETRON ODT 8 MG TABLET: 8 | 10 days supply | Qty: 30 | Fill #5

## 2018-12-16 ENCOUNTER — Telehealth: Payer: Self-pay

## 2018-12-16 DIAGNOSIS — E2740 Unspecified adrenocortical insufficiency: Secondary | ICD-10-CM

## 2018-12-16 MED ORDER — HYDROCORTISONE 10 MG PO TABS: ORAL_TABLET | ORAL | 2 refills | Status: DC

## 2018-12-16 MED FILL — HYDROCORTISONE 10 MG TABLET: 10 | 25 days supply | Qty: 75 | Fill #0

## 2018-12-16 NOTE — Telephone Encounter (Signed)
LeighAnn Melek Pownall, CMA  

## 2018-12-18 DIAGNOSIS — M6281 Muscle weakness (generalized): Secondary | ICD-10-CM | POA: Diagnosis not present

## 2018-12-18 DIAGNOSIS — J9601 Acute respiratory failure with hypoxia: Secondary | ICD-10-CM | POA: Diagnosis not present

## 2018-12-18 DIAGNOSIS — J849 Interstitial pulmonary disease, unspecified: Secondary | ICD-10-CM | POA: Diagnosis not present

## 2018-12-18 DIAGNOSIS — D860 Sarcoidosis of lung: Secondary | ICD-10-CM | POA: Diagnosis not present

## 2018-12-18 MED FILL — OXYCODONE-APAP 10-325: 10-325 | 30 days supply | Qty: 90 | Fill #0

## 2018-12-26 MED FILL — AMITIZA 24 MCG CAPSULES: 24 | 30 days supply | Qty: 60 | Fill #1

## 2018-12-26 MED FILL — predniSONE 5 MG TABS: 5 | 90 days supply | Qty: 90 | Fill #3

## 2018-12-26 MED FILL — LEVOTHYROXINE 75 MCG TABLET: 75 | 30 days supply | Qty: 30 | Fill #5

## 2018-12-26 MED FILL — MIDODRINE HCL 5 MG TABLET: 5 | 30 days supply | Qty: 60 | Fill #2

## 2018-12-29 ENCOUNTER — Other Ambulatory Visit: Payer: Self-pay

## 2018-12-29 ENCOUNTER — Ambulatory Visit (INDEPENDENT_AMBULATORY_CARE_PROVIDER_SITE_OTHER): Payer: 59 | Admitting: Family Medicine

## 2018-12-29 DIAGNOSIS — M87052 Idiopathic aseptic necrosis of left femur: Secondary | ICD-10-CM

## 2018-12-29 DIAGNOSIS — M79605 Pain in left leg: Secondary | ICD-10-CM | POA: Diagnosis not present

## 2018-12-29 NOTE — Progress Notes (Addendum)
   Subjective:    Patient ID: Kathleen [name], female    DOB: 04/09/1961, 58 y.o.   MRN: [id number] Video and [id number]  Leg Pain   Incident onset: going on for a few days. The pain is present in the left leg. The quality of the pain is described as cramping (tightness). Associated symptoms include an inability to bear weight. She has tried heat (TENS unit) for the symptoms. The treatment provided mild relief.  The patient denies any injury to it denies any swelling in her ankle or foot she states this started a few days ago and progressively has become more painful difficult for her to put weight on it  The visit is a virtual visit she is unable to come to the office today   Virtual Visit via Video Note  I connected with Kathleen [name] on 12/29/18 at  2:00 PM EDT by a video enabled telemedicine application and verified that I am speaking with the correct person using two identifiers.  Location: Patient: home Provider: office   I discussed the limitations of evaluation and management by telemedicine and the availability of in person appointments. The patient expressed understanding and agreed to proceed.  History of Present Illness:    Observations/Objective:   Assessment and Plan:   Follow Up Instructions:    I discussed the assessment and treatment plan with the patient. The patient was provided an opportunity to ask questions and all were answered. The patient agreed with the plan and demonstrated an understanding of the instructions.   The patient was advised to call back or seek an in-person evaluation if the symptoms worsen or if the condition fails to improve as anticipated.  I provided 15 minutes of non-face-to-face time during this encounter.   Kathleen Males, LPN     Review of Systems     Objective:   Physical Exam        Assessment & Plan:  Significant leg pain Cannot rule out the possibility of DVT but I think it is less likely We will do a in person  visit in the morning Warm compresses today patient did not have anyone who could bring her to the office late in the day  Addendum-the patient was seen on 12/30/2018 in the side parking lot (patient has underlying health issues and did not want to come into the office) her husband brought her.  She has left thigh tenderness no redness no swelling in the calf no tenderness in the calf she has a history of left hip degeneration-avascular necrosis she would benefit from stat ultrasound to rule out DVT as well as stat x-ray of the left hip and left femur also a CBC and a stat d-dimer we await the results of this

## 2018-12-30 ENCOUNTER — Ambulatory Visit (HOSPITAL_COMMUNITY)
Admission: RE | Admit: 2018-12-30 | Discharge: 2018-12-30 | Disposition: A | Discharge status: Home / Self Care | Payer: 59 | Source: Ambulatory Visit | Attending: Family Medicine | Admitting: Family Medicine

## 2018-12-30 ENCOUNTER — Telehealth: Payer: Self-pay | Admitting: Family Medicine

## 2018-12-30 ENCOUNTER — Other Ambulatory Visit (HOSPITAL_COMMUNITY)
Admission: RE | Admit: 2018-12-30 | Discharge: 2018-12-30 | Disposition: A | Discharge status: Home / Self Care | Payer: 59 | Source: Ambulatory Visit | Attending: Family Medicine | Admitting: Family Medicine

## 2018-12-30 ENCOUNTER — Other Ambulatory Visit: Payer: Self-pay | Admitting: Family Medicine

## 2018-12-30 DIAGNOSIS — M79604 Pain in right leg: Secondary | ICD-10-CM | POA: Diagnosis not present

## 2018-12-30 DIAGNOSIS — M79605 Pain in left leg: Secondary | ICD-10-CM | POA: Insufficient documentation

## 2018-12-30 DIAGNOSIS — M25552 Pain in left hip: Secondary | ICD-10-CM | POA: Diagnosis not present

## 2018-12-30 LAB — CBC WITH DIFFERENTIAL/PLATELET
Abs Immature Granulocytes: 0.08 10*3/uL — ABNORMAL HIGH (ref 0.00–0.07)
Basophils Absolute: 0.1 10*3/uL (ref 0.0–0.1)
Basophils Relative: 1 %
Eosinophils Absolute: 0.5 10*3/uL (ref 0.0–0.5)
Eosinophils Relative: 6 %
HCT: 38.8 % (ref 36.0–46.0)
Hemoglobin: 12.5 g/dL (ref 12.0–15.0)
Immature Granulocytes: 1 %
Lymphocytes Relative: 14 %
Lymphs Abs: 1.1 10*3/uL (ref 0.7–4.0)
MCH: 28 pg (ref 26.0–34.0)
MCHC: 32.2 g/dL (ref 30.0–36.0)
MCV: 86.8 fL (ref 80.0–100.0)
Monocytes Absolute: 0.7 10*3/uL (ref 0.1–1.0)
Monocytes Relative: 8 %
Neutro Abs: 6 10*3/uL (ref 1.7–7.7)
Neutrophils Relative %: 70 %
Platelets: 409 10*3/uL — ABNORMAL HIGH (ref 150–400)
RBC: 4.47 MIL/uL (ref 3.87–5.11)
RDW: 14 % (ref 11.5–15.5)
WBC: 8.4 10*3/uL (ref 4.0–10.5)
nRBC: 0 % (ref 0.0–0.2)

## 2018-12-30 LAB — D-DIMER, QUANTITATIVE: D-Dimer, Quant: 0.28 ug/mL-FEU (ref 0.00–0.50)

## 2018-12-30 MED ORDER — PREDNISONE 10 MG PO TABS: ORAL_TABLET | ORAL | 0 refills | Status: DC

## 2018-12-30 MED ORDER — OXYCODONE HCL 10 MG PO TABS: ORAL_TABLET | ORAL | 0 refills | Status: DC

## 2018-12-30 MED FILL — predniSONE 10 MG TABS: 10 | 9 days supply | Qty: 24 | Fill #0

## 2018-12-30 MED FILL — oxyCODONE HCL 10 MG TABS: 10 | 14 days supply | Qty: 84 | Fill #0

## 2018-12-30 NOTE — Telephone Encounter (Signed)
Lab work, ultrasound and xrays ordered. Pt is aware and is going over to [facility] at this time.

## 2018-12-30 NOTE — Telephone Encounter (Signed)
Per my recommendation the patient was seen in the parking area (she has underlying illness did not want to come into the office) Based on physical findings and her history I recommend the following Stat x-ray left hip, left femur Stat ultrasound venous to rule out DVT left leg Stat labs CBC, d-dimer  Diagnosis left hip and thigh pain

## 2018-12-30 NOTE — Telephone Encounter (Signed)
Please set the patient up for a follow-up visit on May 19 morning time with me in the office  Please notify patient

## 2018-12-30 NOTE — Addendum Note (Signed)
Addended by: Vicente Males on: 12/30/2018 09:23 AM   Modules accepted: Orders

## 2018-12-30 NOTE — Addendum Note (Signed)
Addended by: Vicente Males on: 12/30/2018 09:33 AM   Modules accepted: Orders

## 2019-01-12 MED FILL — PROMETHAZINE 25 MG TABLET: 25 | 8 days supply | Qty: 25 | Fill #1

## 2019-01-12 MED FILL — HYDROCORTISONE 10 MG TABLET: 10 | 25 days supply | Qty: 75 | Fill #1

## 2019-01-12 MED FILL — ONDANSETRON ODT 8 MG TABLET: 8 | 4 days supply | Qty: 12 | Fill #1

## 2019-01-13 ENCOUNTER — Other Ambulatory Visit: Payer: Self-pay

## 2019-01-13 ENCOUNTER — Encounter: Payer: Self-pay | Admitting: Family Medicine

## 2019-01-13 ENCOUNTER — Ambulatory Visit (INDEPENDENT_AMBULATORY_CARE_PROVIDER_SITE_OTHER): Payer: 59 | Admitting: Family Medicine

## 2019-01-13 VITALS — BP 110/72 | Temp 97.7°F | Wt 214.4 lb

## 2019-01-13 DIAGNOSIS — M79605 Pain in left leg: Secondary | ICD-10-CM

## 2019-01-13 MED ORDER — OXYCODONE HCL 10 MG PO TABS: ORAL_TABLET | ORAL | 0 refills | Status: DC

## 2019-01-13 MED FILL — oxyCODONE HCL 10 MG TABS: 10 | 30 days supply | Qty: 120 | Fill #0

## 2019-01-13 NOTE — Addendum Note (Signed)
Addended by: Karle Barr on: 01/13/2019 02:05 PM   Modules accepted: Orders

## 2019-01-13 NOTE — Progress Notes (Signed)
   Subjective:    Patient ID: Kathleen [name], female    DOB: [birth year], 58 y.o.   MRN: [id number]  HPI Pt here today for follow up visit. Pt was seen on 12/29/2018 for left leg pain. Pt has xray and ultrasound of legs. Still having significant pain in the left leg.  Worse in the left side.  Hurts with certain movements.  Uses walker to get around.  Having to take pain medicine 3-4 times per day.  Denies abusing the medicine.  She is concerned about her hip and that is interested in potentially getting surgery in the future but is not contemplating that currently because of coronavirus  Pt is still having pain in legs. Pt states her left leg is the issue. Pt states she is having bad days and good day.    Review of Systems  Constitutional: Negative for activity change and appetite change.  HENT: Negative for congestion and rhinorrhea.   Respiratory: Negative for cough and shortness of breath.   Cardiovascular: Negative for chest pain and leg swelling.  Gastrointestinal: Negative for abdominal pain, nausea and vomiting.  Musculoskeletal: Positive for arthralgias and gait problem.  Skin: Negative for color change.  Neurological: Negative for dizziness and weakness.  Psychiatric/Behavioral: Negative for agitation and confusion.       Objective:   Physical Exam Vitals signs reviewed.  Constitutional:      General: She is not in acute distress. HENT:     Head: Normocephalic.  Cardiovascular:     Rate and Rhythm: Normal rate and regular rhythm.     Heart sounds: Normal heart sounds. No murmur.  Pulmonary:     Effort: Pulmonary effort is normal.     Breath sounds: Normal breath sounds.  Lymphadenopathy:     Cervical: No cervical adenopathy.  Neurological:     Mental Status: She is alert.  Psychiatric:        Behavior: Behavior normal.           Assessment & Plan:  Patient has underlying lung issue which puts her at higher risk for coronavirus she will hold off on any type of  surgery until coronavirus risk die down  I think it is reasonable for her to get an opinion from Dr. Percell Miller regarding her hip she does have underlying lung issue for which other orthopedics have been reluctant to do surgery on her but stated it could be done the patient states that she has had family members with Dr. Percell Miller and has a high level of trust of his opinion we will help set up the appointment  As for her pain she may use the pain medicine up to 4 times per day avoid excessive use.

## 2019-01-13 NOTE — Progress Notes (Signed)
Referral made in Epic.(patient aware in the office)

## 2019-01-17 DIAGNOSIS — J9601 Acute respiratory failure with hypoxia: Secondary | ICD-10-CM | POA: Diagnosis not present

## 2019-01-17 DIAGNOSIS — M6281 Muscle weakness (generalized): Secondary | ICD-10-CM | POA: Diagnosis not present

## 2019-01-17 DIAGNOSIS — J849 Interstitial pulmonary disease, unspecified: Secondary | ICD-10-CM | POA: Diagnosis not present

## 2019-01-17 DIAGNOSIS — D860 Sarcoidosis of lung: Secondary | ICD-10-CM | POA: Diagnosis not present

## 2019-01-21 MED FILL — PROMETHAZINE 25 MG TABLET: 25 | 8 days supply | Qty: 25 | Fill #2

## 2019-01-21 MED FILL — AMITIZA 24 MCG CAPSULES: 24 | 30 days supply | Qty: 60 | Fill #2

## 2019-01-21 MED FILL — LEVOTHYROXINE 75 MCG TABLET: 75 | 30 days supply | Qty: 30 | Fill #6

## 2019-01-21 MED FILL — MIDODRINE HCL 5 MG TABLET: 5 | 30 days supply | Qty: 60 | Fill #3

## 2019-02-02 ENCOUNTER — Ambulatory Visit (INDEPENDENT_AMBULATORY_CARE_PROVIDER_SITE_OTHER): Payer: 59 | Admitting: "Endocrinology

## 2019-02-02 ENCOUNTER — Other Ambulatory Visit: Payer: Self-pay

## 2019-02-02 ENCOUNTER — Encounter: Payer: Self-pay | Admitting: "Endocrinology

## 2019-02-02 ENCOUNTER — Telehealth: Payer: Self-pay | Admitting: "Endocrinology

## 2019-02-02 VITALS — BP 104/66 | HR 98 | Ht 70.0 in | Wt 217.0 lb

## 2019-02-02 DIAGNOSIS — E2740 Unspecified adrenocortical insufficiency: Secondary | ICD-10-CM

## 2019-02-02 DIAGNOSIS — E039 Hypothyroidism, unspecified: Secondary | ICD-10-CM | POA: Diagnosis not present

## 2019-02-02 NOTE — Progress Notes (Signed)
Endocrinology follow-up  Note                                            02/02/2019, 11:32 AM   Subjective:    Patient ID: Kathleen [name], female    DOB: 1960-10-30, PCP [name], MD   Past Medical History:  Diagnosis Date  . Adrenal insufficiency ([place])   . Anemia   . Avascular bone necrosis ([place])   . Breast fibrocystic disorder   . Chronic pain   . Fibromyalgia   . Gastroesophageal reflux disease   . Mitral valve prolapse   . Orthostatic hypotension   . Peripheral neuropathy   . Restrictive lung disease    Moderate to severe  . Sarcoidosis    Biopsy proven - [facility]  . SOB (shortness of breath)    chronic   Past Surgical History:  Procedure Laterality Date  . BIOPSY  10/17/2018   Procedure: BIOPSY;  Surgeon: Rogene Houston, MD;  Location: [facility];  Service: Endoscopy;;  duodenum, antrum, gastric body  . BREAST LUMPECTOMY  01/12/2011   right  . ESOPHAGOGASTRODUODENOSCOPY (EGD) WITH PROPOFOL N/A 10/17/2018   Procedure: ESOPHAGOGASTRODUODENOSCOPY (EGD) WITH PROPOFOL;  Surgeon: Rogene Houston, MD;  Location: [facility];  Service: Endoscopy;  Laterality: N/A;  2:25  . TUBAL LIGATION     Social History   Socioeconomic History  . Marital status: Married    Spouse name: Not on file  . Number of children: 1  . Years of education: Not on file  . Highest education level: Not on file  Occupational History  . Occupation: Teacher, early years/pre: [employer]  . Financial resource strain: Not on file  . Food insecurity:    Worry: Not on file    Inability: Not on file  . Transportation needs:    Medical: Not on file    Non-medical: Not on file  Tobacco Use  . Smoking status: Never Smoker  . Smokeless tobacco: Never Used  Substance and Sexual Activity  . Alcohol use: No  . Drug use: No  . Sexual activity: Yes  Lifestyle  . Physical activity:    Days per week: Not on file    Minutes per session: Not on file  . Stress: Not on  file  Relationships  . Social connections:    Talks on phone: Not on file    Gets together: Not on file    Attends religious service: Not on file    Active member of club or organization: Not on file    Attends meetings of clubs or organizations: Not on file    Relationship status: Not on file  Other Topics Concern  . Not on file  Social History Narrative   Daily caffeine    Family History  Problem Relation Age of Onset  . Cardiomyopathy Mother   . Breast cancer Mother   . Prostate cancer Father   . Neurofibromatosis Brother   . Heart disease Maternal Aunt   . Bipolar disorder Daughter   . Colon cancer Neg Hx    Outpatient Encounter Medications as of 02/02/2019  Medication Sig  . acetaminophen (TYLENOL) 500 MG tablet Take 1,000 mg by mouth every 8 (eight) hours as needed for headache.  . albuterol (PROVENTIL) (2.5 MG/3ML) 0.083% nebulizer solution  Take 3 mLs (2.5 mg total) by nebulization every 2 (two) hours as needed for wheezing or shortness of breath.  . budesonide (PULMICORT) 0.5 MG/2ML nebulizer solution Take 2 mLs (0.5 mg total) by nebulization 2 (two) times daily.  . busPIRone (BUSPAR) 5 MG tablet One po TID Prn for anxiety  . ferrous sulfate 325 (65 FE) MG EC tablet Take 325 mg by mouth daily with breakfast.  . hydrocortisone (CORTEF) 10 MG tablet 2  tablets at 8 AM and 1 tablet at 1PM  . levothyroxine (SYNTHROID, LEVOTHROID) 75 MCG tablet Take half tablet on Monday then one tablet all other days  . lubiprostone (AMITIZA) 24 MCG capsule Take 1 capsule (24 mcg total) by mouth 2 (two) times daily with a meal.  . midodrine (PROAMATINE) 5 MG tablet TAKE 1 TABLET BY MOUTH 2 TIMES DAILY WITH A MEAL.  Marland Kitchen ondansetron (ZOFRAN-ODT) 8 MG disintegrating tablet DISSOLVE 1 TABLET BY MOUTH EVERY 8 HOURS AS NEEDED FOR NAUSEA OR VOMITING  . Oxycodone HCl 10 MG TABS 1 qid  as needed severe pain  . pantoprazole (PROTONIX) 40 MG tablet Take 1 tablet (40 mg total) by mouth daily.  . predniSONE  (DELTASONE) 10 MG tablet Take 1 tablet (10 mg total) by mouth daily with breakfast.  . predniSONE (DELTASONE) 10 MG tablet 4 daily for 2 days then 3 daily for 2 days then 2 daily for 5 days then resume usual dosing  . promethazine (PHENERGAN) 25 MG tablet TAKE 1 TABLET BY MOUTH EVERY 8 HOURS AS NEEDED FOR NAUSEA OR VOMITING.   No facility-administered encounter medications on file as of 02/02/2019.    ALLERGIES: Allergies  Allergen Reactions  . Bee Venom Anaphylaxis  . Other Other (See Comments)    Contrast Dye CT chest w/ contrast at OSH followed by SOB resolved w/ single dose steroids, never ENT swelling, intubation or pressors, has had multiple contrasted scans since  . Codeine Itching  . Morphine And Related Other (See Comments)    Unknown reaction- pt gets sick, dizzy, and confused Intolerance, not an allergy, received this in the emergency department recently without a problem.    VACCINATION STATUS: Immunization History  Administered Date(s) Administered  . Influenza Whole 06/27/2010, 07/28/2011  . Pneumococcal Conjugate-13 06/15/2014  . Pneumococcal Polysaccharide-23 06/10/2013    HPI Kathleen [name] is 58 y.o. female who presents today with a medical history as above.  -she is returning for follow-up of her history of adrenal insufficiency and hypothyroidism.    - Her history is complicated including sarcoidosis which she dealt with for more than the last 10 years.  She is on regular follow-up with her pulmonologist.  Over the years, her therapy included high-dose steroids, up to 60 mg daily of prednisone, for the last 4-6 years.  She is currently on maintenance dose of prednisone 10 mg p.o. daily.  For continued insufficiency, she was initiated and kept on hydrocortisone 20 mg a.m. and 10 mg p.m.  She has relatively stable course since her last visit. -She is more consistent taking her medications this time.  She also has hypothyroidism on levothyroxine 75 mcg p.o. every  morning.  She avoided hospitalization, she still complains of the same symptoms including fatigue.  However, she has done very well in terms of her GI complaints, no vomiting/diarrhea.    -She has significant cognitive deficit, may be forgetting her medications at times. -Her weight is stable since last visit.   - The details of her diagnosis with  adrenal insufficiency  is not available to review. -Despite previous recommendation, she still does not wear  medic alert. She denies any history of tuberculosis, incarceration, nor overseas travel . -She denies family history of adrenal, pituitary dysfunction. She has family history of hypothyroidism in one sibling. - Denies any history of intra-abdominal injury. She is on hydrocodone for chronic pain and inhaled bronchodilators. She is known to have orthostatic hypotension for which she is on midodrine.    Review of Systems  Constitutional:  + Steady weight lately, + fatigue, no subjective hyperthermia, no subjective hypothermia Eyes: no blurry vision, no xerophthalmia ENT: no sore throat, no nodules palpated in throat, no dysphagia/odynophagia, no hoarseness Cardiovascular: no Chest Pain, no Shortness of Breath, no palpitations, no leg swelling Respiratory: no cough, no shortness of breath Gastrointestinal: no Nausea/Vomiting/Diarhhea Musculoskeletal: no muscle/joint aches Skin: no rashes Neurological: no tremors, no numbness, no tingling, no dizziness Psychiatric: no depression, + anxiety  Objective:    BP 104/66   Pulse 98   Ht 5\' 10"  (1.778 m)   Wt 217 lb (98.4 kg)   LMP 05/17/2016   BMI 31.14 kg/m   Wt Readings from Last 3 Encounters:  02/02/19 217 lb (98.4 kg)  01/13/19 214 lb 6.4 oz (97.3 kg)  11/12/18 221 lb 6.4 oz (100.4 kg)    Physical Exam  Constitutional:  not in acute distress, normal state of mind Eyes:  EOMI, no exophthalmos Neck: Supple Respiratory: Adequate breathing efforts Musculoskeletal: no gross  deformities, strength intact in all four extremities Skin:  no rashes, no hyperemia Neurological: no tremor with outstretched hands.  CMP ( most recent) CMP     Component Value Date/Time   NA 138 11/02/2018 0556   NA 139 10/27/2018 1608   K 3.8 11/02/2018 0556   CL 106 11/02/2018 0556   CO2 25 11/02/2018 0556   GLUCOSE 90 11/02/2018 0556   BUN 9 11/02/2018 0556   BUN 11 10/27/2018 1608   CREATININE 0.97 11/02/2018 0556   CREATININE 0.85 08/26/2014 0831   CALCIUM 9.4 11/02/2018 0556   PROT 6.7 10/27/2018 1608   ALBUMIN 4.2 10/27/2018 1608   AST 21 10/27/2018 1608   ALT 17 10/27/2018 1608   ALKPHOS 84 10/27/2018 1608   BILITOT 0.3 10/27/2018 1608   GFRNONAA >60 11/02/2018 0556   GFRAA >60 11/02/2018 0556     Diabetic Labs (most recent): Lab Results  Component Value Date   HGBA1C 5.0 12/31/2016   HGBA1C 5.3 08/21/2016     Lipid Panel ( most recent) Lipid Panel     Component Value Date/Time   CHOL 259 (H) 02/20/2018 1346   TRIG 380 (H) 02/20/2018 1346   HDL 52 02/20/2018 1346   CHOLHDL 5.0 (H) 02/20/2018 1346   CHOLHDL 4.0 08/26/2014 0831   VLDL 31 08/26/2014 0831   LDLCALC 131 (H) 02/20/2018 1346      Lab Results  Component Value Date   TSH 1.290 08/07/2018   TSH 2.280 04/17/2018   TSH 0.527 12/04/2017   TSH 0.242 (L) 10/22/2017   TSH 0.769 10/15/2017   TSH 0.545 08/09/2017   TSH 5.190 (H) 04/19/2017   TSH 4.950 (H) 12/31/2016   TSH 0.463 08/18/2016   TSH 0.412 01/01/2016   FREET4 1.38 08/09/2017   FREET4 1.15 04/19/2017   FREET4 0.99 12/31/2016      Assessment & Plan:   1. Adrenal insufficiency ([place]) -I have reviewed her available records and clinically evaluated this patient. She does have multiple risk factors  for adrenal insufficiency including exposure to high-dose steroids related to her sarcoidosis, as well as opioid therapy related to diffuse pain. -  She has taken prednisone up to 60 mg daily for up to 6 years prior to her diagnosis with  adrenal insufficiency. -She is on a stable dose of prednisone 10 mg p.o. daily related related to her sarcoidosis. -Given her recent symptoms indicative of adequate steroid replacement, hydrocortisone was added.   -Until she becomes stable, I have recommended her to continue hydrocortisone 20 mg every 8 AM, and 10 mg every 1 PM daily.  This is in addition to her ongoing prednisone 10 mg p.o. daily.  -She is also advised to wear medical alert affecting her diagnosis and need for steroids.   -She will not be considered for work-up by withdrawal state supports, risk of adrenal crisis.  -She is advised to go to ER if she cannot keep her medications down due to intractable vomiting. - She will be evaluated for the need for mineralocorticoid replacement which may help to stabilize her blood pressure. She is currently on medodrine 5 mg by mouth twice a day. - I had a long discussion with her regarding steroids sick day rules, risk of weight gain/diabetes and the need for periodic screening with A1c.  -Her last A1c was 5% indicating absence of prediabetes/diabetes.    2. Hypothyroidism:  - She may be losing her thyroid hormone during vomiting.  She does not have recent thyroid function test to review.  She is advised to continue levothyroxine 75 mcg p.o. every morning.  She will have repeat thyroid function tests on subsequent visits.    - We discussed about the correct intake of her thyroid hormone, on empty stomach at fasting, with water, separated by at least 30 minutes from breakfast and other medications,  and separated by more than 4 hours from calcium, iron, multivitamins, acid reflux medications (PPIs). -Patient is made aware of the fact that thyroid hormone replacement is needed for life, dose to be adjusted by periodic monitoring of thyroid function tests.    - I advised her  to maintain close follow up with [name], MD for primary care needs. - Time spent with the patient: 20 min,  of which >50% was spent in reviewing her  current and  previous labs/studies, previous treatments, and medications doses and developing a plan for long-term care based on the latest recommendations for standards of care. Please refer to " Patient Self Inventory" in the Media  tab for reviewed elements of pertinent patient history. Kathleen [name] participated in the discussions, expressed understanding, and voiced agreement with the above plans.  All questions were answered to her satisfaction. she is encouraged to contact clinic should she have any questions or concerns prior to her return visit.   Follow up plan: Return in about 6 months (around 08/04/2019), or labs today and before next visit, for Follow up with Pre-visit Labs.   Glade Lloyd, MD [facility] [address] [place], [place] [address] Phone: [phone]  Fax: [phone]     02/02/2019, 11:32 AM  This note was partially dictated with voice recognition software. Similar sounding words can be transcribed inadequately or may not  be corrected upon review.

## 2019-02-02 NOTE — Telephone Encounter (Signed)
Patient said she went to get her labs done and it was a long wait. She will go back in the morning first thing

## 2019-02-03 ENCOUNTER — Other Ambulatory Visit: Payer: Self-pay | Admitting: "Endocrinology

## 2019-02-03 DIAGNOSIS — E2740 Unspecified adrenocortical insufficiency: Secondary | ICD-10-CM | POA: Diagnosis not present

## 2019-02-03 DIAGNOSIS — R5383 Other fatigue: Secondary | ICD-10-CM | POA: Diagnosis not present

## 2019-02-03 DIAGNOSIS — E039 Hypothyroidism, unspecified: Secondary | ICD-10-CM | POA: Diagnosis not present

## 2019-02-04 ENCOUNTER — Other Ambulatory Visit: Payer: Self-pay | Admitting: "Endocrinology

## 2019-02-04 LAB — HGB A1C W/O EAG: Hgb A1c MFr Bld: 5.4 % (ref 4.8–5.6)

## 2019-02-04 LAB — T4, FREE: Free T4: 1.32 ng/dL (ref 0.82–1.77)

## 2019-02-04 LAB — VITAMIN D 25 HYDROXY (VIT D DEFICIENCY, FRACTURES): Vit D, 25-Hydroxy: 19.3 ng/mL — ABNORMAL LOW (ref 30.0–100.0)

## 2019-02-04 LAB — TSH: TSH: 0.746 u[IU]/mL (ref 0.450–4.500)

## 2019-02-04 MED ORDER — VITAMIN D3 125 MCG (5000 UT) PO CAPS: 5000.0000 [IU] | ORAL_CAPSULE | Freq: Every day | ORAL | 0 refills | Status: DC

## 2019-02-09 ENCOUNTER — Encounter: Payer: Self-pay | Admitting: Family Medicine

## 2019-02-09 DIAGNOSIS — F419 Anxiety disorder, unspecified: Secondary | ICD-10-CM | POA: Diagnosis not present

## 2019-02-09 DIAGNOSIS — D860 Sarcoidosis of lung: Secondary | ICD-10-CM | POA: Diagnosis not present

## 2019-02-09 DIAGNOSIS — K219 Gastro-esophageal reflux disease without esophagitis: Secondary | ICD-10-CM | POA: Diagnosis not present

## 2019-02-09 DIAGNOSIS — J45909 Unspecified asthma, uncomplicated: Secondary | ICD-10-CM | POA: Diagnosis not present

## 2019-02-09 DIAGNOSIS — F329 Major depressive disorder, single episode, unspecified: Secondary | ICD-10-CM | POA: Diagnosis not present

## 2019-02-09 DIAGNOSIS — J849 Interstitial pulmonary disease, unspecified: Secondary | ICD-10-CM | POA: Diagnosis not present

## 2019-02-09 DIAGNOSIS — R0602 Shortness of breath: Secondary | ICD-10-CM | POA: Diagnosis not present

## 2019-02-11 ENCOUNTER — Other Ambulatory Visit: Payer: Self-pay

## 2019-02-11 ENCOUNTER — Ambulatory Visit (INDEPENDENT_AMBULATORY_CARE_PROVIDER_SITE_OTHER): Payer: 59 | Admitting: Family Medicine

## 2019-02-11 ENCOUNTER — Encounter: Payer: Self-pay | Admitting: Family Medicine

## 2019-02-11 VITALS — BP 102/65

## 2019-02-11 DIAGNOSIS — E271 Primary adrenocortical insufficiency: Secondary | ICD-10-CM

## 2019-02-11 DIAGNOSIS — G894 Chronic pain syndrome: Secondary | ICD-10-CM | POA: Diagnosis not present

## 2019-02-11 DIAGNOSIS — E038 Other specified hypothyroidism: Secondary | ICD-10-CM

## 2019-02-11 DIAGNOSIS — J9601 Acute respiratory failure with hypoxia: Secondary | ICD-10-CM

## 2019-02-11 DIAGNOSIS — E039 Hypothyroidism, unspecified: Secondary | ICD-10-CM | POA: Diagnosis not present

## 2019-02-11 DIAGNOSIS — M87052 Idiopathic aseptic necrosis of left femur: Secondary | ICD-10-CM

## 2019-02-11 MED ORDER — OXYCODONE HCL 10 MG PO TABS: ORAL_TABLET | ORAL | 0 refills | Status: DC

## 2019-02-11 MED ORDER — EPINEPHRINE 0.3 MG/0.3ML IJ SOAJ: 0.3000 mg | INTRAMUSCULAR | 1 refills | Status: DC | PRN

## 2019-02-11 MED FILL — EPINEPHRINE 0.3 MG AUTO-INJ: 0.3 | 30 days supply | Qty: 2 | Fill #0

## 2019-02-11 NOTE — Progress Notes (Signed)
Subjective:    Patient ID: Kathleen [name], female    DOB: 16-Aug-1961, 58 y.o.   MRN: [id number] Video only HPI Pt having 3 week follow up. Pt was seen for left leg pain about 3 weeks ago. Pt states that her leg is better but not much. This patient was seen today for chronic pain  The medication list was reviewed and updated.   -Compliance with medication: She relates compliance with her medicine  - Number patient states they take daily: Takes approximately 4 a day  -when was the last dose patient took?  Earlier today  The patient was advised the importance of maintaining medication and not using illegal substances with these.  Here for refills and follow up  The patient was educated that we can provide 3 monthly scripts for their medication, it is their responsibility to follow the instructions.  Side effects or complications from medications: Denies any complications does need refills  Patient is aware that pain medications are meant to minimize the severity of the pain to allow their pain levels to improve to allow for better function. They are aware of that pain medications cannot totally remove their pain.  Due for UDT ( at least once per year) : Later this year We had a long discussion regarding her pulmonary fibrosis she is disabled she is also at high risk of coronavirus she will do the best she can minimizing her risk I also advised her to get up pulse oximetry meter she is on chronic oxygen     Virtual Visit via Video Note  I connected with Kathleen [name] on 02/11/19 at 10:00 AM EDT by a video enabled telemedicine application and verified that I am speaking with the correct person using two identifiers.  Location: Patient: home Provider: office   I discussed the limitations of evaluation and management by telemedicine and the availability of in person appointments. The patient expressed understanding and agreed to proceed.  History of Present Illness:     Observations/Objective:   Assessment and Plan:   Follow Up Instructions:    I discussed the assessment and treatment plan with the patient. The patient was provided an opportunity to ask questions and all were answered. The patient agreed with the plan and demonstrated an understanding of the instructions.   The patient was advised to call back or seek an in-person evaluation if the symptoms worsen or if the condition fails to improve as anticipated.  I provided 17 minutes of non-face-to-face time during this encounter.   Vicente Males, LPN    Review of Systems  Constitutional: Negative for activity change and appetite change.  HENT: Negative for congestion and rhinorrhea.   Respiratory: Negative for cough and shortness of breath.   Cardiovascular: Negative for chest pain and leg swelling.  Gastrointestinal: Negative for abdominal pain, nausea and vomiting.  Skin: Negative for color change.  Neurological: Positive for weakness. Negative for dizziness.  Psychiatric/Behavioral: Negative for agitation and confusion.       Objective:   Physical Exam  Patient had virtual visit Appears to be in no distress Atraumatic Neuro able to relate and oriented No apparent resp distress Color normal       Assessment & Plan:  The patient was seen in followup for chronic pain. A review over at their current pain status was discussed. Drug registry was checked. Prescriptions were given. Discussion was held regarding the importance of compliance with medication as well as pain medication contract.  Time  for questions regarding pain management plan occurred. Importance of regular followup visits was discussed. Patient was informed that medication may cause drowsiness and should not be combined  with other medications/alcohol or street drugs. Patient was cautioned that medication could cause drowsiness. If the patient feels medication is causing altered alertness then do not drive or  operate dangerous equipment.  This patient does benefit from her pain medicine because of the left hip avascular necrosis it is medically necessary for her patient has been counseled regarding the safety of the medicine  Patient has history of respiratory failure and having to be on oxygen  Also has adrenal insufficiency followed by endocrinologist and on chronic steroids  15 minutes was spent with patient today discussing healthcare issues which they came.  More than 50% of this visit-total duration of visit-was spent in counseling and coordination of care.  Please see diagnosis regarding the focus of this coordination and care

## 2019-02-12 ENCOUNTER — Ambulatory Visit: Payer: 59 | Admitting: Family Medicine

## 2019-02-12 MED FILL — oxyCODONE HCL 10 MG TABS: 10 | 30 days supply | Qty: 120 | Fill #0

## 2019-02-13 ENCOUNTER — Other Ambulatory Visit: Payer: Self-pay | Admitting: "Endocrinology

## 2019-02-13 MED FILL — PANTOPRAZOLE SOD DR 40 MG T: 40 | 90 days supply | Qty: 90 | Fill #1

## 2019-02-16 MED FILL — LEVOTHYROXINE 75 MCG TABLET: 75 | 30 days supply | Qty: 30 | Fill #0

## 2019-02-17 DIAGNOSIS — M6281 Muscle weakness (generalized): Secondary | ICD-10-CM | POA: Diagnosis not present

## 2019-02-17 DIAGNOSIS — D860 Sarcoidosis of lung: Secondary | ICD-10-CM | POA: Diagnosis not present

## 2019-02-17 DIAGNOSIS — J9601 Acute respiratory failure with hypoxia: Secondary | ICD-10-CM | POA: Diagnosis not present

## 2019-02-17 DIAGNOSIS — J849 Interstitial pulmonary disease, unspecified: Secondary | ICD-10-CM | POA: Diagnosis not present

## 2019-02-23 MED FILL — HYDROCORTISONE 10 MG TABLET: 10 | 25 days supply | Qty: 75 | Fill #2

## 2019-03-12 ENCOUNTER — Other Ambulatory Visit: Payer: Self-pay | Admitting: "Endocrinology

## 2019-03-12 DIAGNOSIS — E2740 Unspecified adrenocortical insufficiency: Secondary | ICD-10-CM

## 2019-03-12 MED FILL — ONDANSETRON ODT 8 MG TABLET: 8 | 4 days supply | Qty: 12 | Fill #2

## 2019-03-12 MED FILL — PROMETHAZINE 25 MG TABLET: 25 | 8 days supply | Qty: 25 | Fill #3

## 2019-03-13 MED FILL — oxyCODONE HCL 10 MG TABS: 10 | 30 days supply | Qty: 120 | Fill #0

## 2019-03-15 MED FILL — HYDROCORTISONE 10 MG TABLET: 10 | 25 days supply | Qty: 75 | Fill #0

## 2019-03-19 DIAGNOSIS — J849 Interstitial pulmonary disease, unspecified: Secondary | ICD-10-CM | POA: Diagnosis not present

## 2019-03-19 DIAGNOSIS — M6281 Muscle weakness (generalized): Secondary | ICD-10-CM | POA: Diagnosis not present

## 2019-03-19 DIAGNOSIS — J9601 Acute respiratory failure with hypoxia: Secondary | ICD-10-CM | POA: Diagnosis not present

## 2019-03-19 DIAGNOSIS — D860 Sarcoidosis of lung: Secondary | ICD-10-CM | POA: Diagnosis not present

## 2019-03-27 ENCOUNTER — Other Ambulatory Visit: Payer: Self-pay | Admitting: Family Medicine

## 2019-03-27 MED FILL — MIDODRINE HCL 5 MG TABLET: 5 | 30 days supply | Qty: 60 | Fill #0

## 2019-03-27 MED FILL — predniSONE 5 MG TABS: 5 | 90 days supply | Qty: 180 | Fill #0

## 2019-03-27 MED FILL — HYDROCORTISONE 10 MG TABLET: 10 | 25 days supply | Qty: 75 | Fill #0

## 2019-04-09 ENCOUNTER — Other Ambulatory Visit: Payer: Self-pay | Admitting: Internal Medicine

## 2019-04-09 ENCOUNTER — Other Ambulatory Visit: Payer: Self-pay

## 2019-04-09 ENCOUNTER — Ambulatory Visit (INDEPENDENT_AMBULATORY_CARE_PROVIDER_SITE_OTHER): Payer: 59 | Admitting: Family Medicine

## 2019-04-09 DIAGNOSIS — K146 Glossodynia: Secondary | ICD-10-CM

## 2019-04-09 DIAGNOSIS — R432 Parageusia: Secondary | ICD-10-CM | POA: Diagnosis not present

## 2019-04-09 DIAGNOSIS — Z20822 Contact with and (suspected) exposure to covid-19: Secondary | ICD-10-CM

## 2019-04-09 NOTE — Progress Notes (Signed)
   Subjective:    Patient ID: Kathleen [name], female    DOB: [birth year], 58 y.o.   MRN: [id number]  HPI Me a this is Dr. Nicki Reaper hi there are you today right yet good right okay Patient calls reporting she could not taste her breakfast this am. Patient states she burnt her tongue last night on soup and not sure if it is from that or from covid. PMH has underlying serious lung disease Virtual Visit via Video Note right  I connected with Kathleen [name] on 04/09/19 at  9:30 AM EDT by a video enabled telemedicine application and verified that I am speaking with the correct person using two identifiers.  Location: Patient: home Provider: office   I discussed the limitations of evaluation and management by telemedicine and the availability of in person appointments. The patient expressed understanding and agreed to proceed.  History of Present Illness:    Observations/Objective:   Assessment and Plan:   Follow Up Instructions:    I discussed the assessment and treatment plan with the patient. The patient was provided an opportunity to ask questions and all were answered. The patient agreed with the plan and demonstrated an understanding of the instructions.   The patient was advised to call back or seek an in-person evaluation if the symptoms worsen or if the condition fails to improve as anticipated.  I provided 1 chart5 minutes of non-face-to-face time during this encounter.       Review of Systems  Constitutional: Negative for activity change and appetite change.  HENT: Negative for congestion and rhinorrhea.   Respiratory: Negative for cough and shortness of breath.   Cardiovascular: Negative for chest pain and leg swelling.  Gastrointestinal: Negative for abdominal pain, nausea and vomiting.  Skin: Negative for color change.  Neurological: Negative for dizziness and weakness.  Psychiatric/Behavioral: Negative for agitation and confusion.  burning tongue raw feeling   Objective:   Physical Exam Today's visit was via telephone Physical exam was not possible for this visit        Assessment & Plan:  Viral-like illness Could be but tongue from the hot soup Does need to do COVID testing Warning signs for COVID discussed We will follow-up regarding the results  Addendum- COVID testing came back negative which is good talk with the patient she is doing well  .15 15 minutes was spent with patient today discussing healthcare issues which they came.  More than 50% of this visit-total duration of visit-was spent in counseling and coordination of care.  Please see diagnosis regarding the focus of this coordination and care

## 2019-04-10 MED FILL — ONDANSETRON ODT 8 MG TABLET: 8 | 4 days supply | Qty: 12 | Fill #3

## 2019-04-10 MED FILL — LEVOTHYROXINE 75 MCG TABLET: 75 | 30 days supply | Qty: 30 | Fill #1

## 2019-04-11 LAB — NOVEL CORONAVIRUS, NAA: SARS-CoV-2, NAA: NOT DETECTED

## 2019-04-13 MED FILL — oxyCODONE HCL 10 MG TABS: 10 | 30 days supply | Qty: 120 | Fill #0

## 2019-04-14 ENCOUNTER — Other Ambulatory Visit: Payer: Self-pay

## 2019-04-14 ENCOUNTER — Encounter: Payer: Self-pay | Admitting: Family Medicine

## 2019-04-14 ENCOUNTER — Ambulatory Visit (INDEPENDENT_AMBULATORY_CARE_PROVIDER_SITE_OTHER): Payer: 59 | Admitting: Family Medicine

## 2019-04-14 DIAGNOSIS — J849 Interstitial pulmonary disease, unspecified: Secondary | ICD-10-CM | POA: Diagnosis not present

## 2019-04-14 DIAGNOSIS — M87052 Idiopathic aseptic necrosis of left femur: Secondary | ICD-10-CM | POA: Diagnosis not present

## 2019-04-14 DIAGNOSIS — E2740 Unspecified adrenocortical insufficiency: Secondary | ICD-10-CM

## 2019-04-14 MED ORDER — OXYCODONE HCL 10 MG PO TABS: ORAL_TABLET | ORAL | 0 refills | Status: DC

## 2019-04-14 NOTE — Progress Notes (Signed)
Subjective:    Patient ID: Kathleen [name], female    DOB: Aug 19, 1961, 58 y.o.   MRN: [id number]  HPI This patient was seen today for chronic pain In my opinion the pain medicine is necessary to allow her to function at the level she does she states 4 days not doing now she is requesting increased dose either in the strength or frequency I told her the maximum we would do is 5/day otherwise we will need to send her to a pain management this Also in addition to this patient denies that medication causes any significant drowsiness The medication list was reviewed and updated.   -Compliance with medication: Oxycodone 10 mg take one qid prn severe pain   - Number patient states they take daily: 3-4  -when was the last dose patient took? This morning before breakfast   The patient was advised the importance of maintaining medication and not using illegal substances with these.  Here for refills and follow up  The patient was educated that we can provide 3 monthly scripts for their medication, it is their responsibility to follow the instructions.  Side effects or complications from medications: none  Patient is aware that pain medications are meant to minimize the severity of the pain to allow their pain levels to improve to allow for better function. They are aware of that pain medications cannot totally remove their pain.  Due for UDT ( at least once per year) :   Virtual Visit via Video Note  I connected with Kathleen [name] on 04/14/19 at  8:30 AM EDT by a video enabled telemedicine application and verified that I am speaking with the correct person using two identifiers.  Location: Patient: home Provider: office   I discussed the limitations of evaluation and management by telemedicine and the availability of in person appointments. The patient expressed understanding and agreed to proceed.  History of Present Illness:    Observations/Objective:   Assessment and Plan:    Follow Up Instructions:    I discussed the assessment and treatment plan with the patient. The patient was provided an opportunity to ask questions and all were answered. The patient agreed with the plan and demonstrated an understanding of the instructions.   The patient was advised to call back or seek an in-person evaluation if the symptoms worsen or if the condition fails to improve as anticipated.  I provided 17 minutes of non-face-to-face time during this encounter.  Evaluate opening message on the first of   Kathleen Males, LPN        Review of Systems  Constitutional: Negative for activity change, appetite change and fatigue.  HENT: Negative for congestion and rhinorrhea.   Respiratory: Negative for cough and shortness of breath.   Cardiovascular: Negative for chest pain and leg swelling.  Gastrointestinal: Negative for abdominal pain and diarrhea.  Endocrine: Negative for polydipsia and polyphagia.  Musculoskeletal: Positive for arthralgias. Negative for back pain.  Skin: Negative for color change.  Neurological: Negative for dizziness and weakness.  Psychiatric/Behavioral: Negative for behavioral problems and confusion.       Objective:   Physical Exam  Patient had virtual visit Appears to be in no distress Atraumatic Neuro able to relate and oriented No apparent resp distress Color normal       Assessment & Plan:  Patient does have avascular necrosis Having severe pain Unable to do surgery currently Recommend pain medicine maximum 5/day 150 tablets 3 scripts granted may get her  next prescription on September 9 Patient will keep [place] updated how she is doing  Patient denies any addiction issues denies any side effects with the medicine She is following through with her endocrinologist regarding adrenal insufficiency as well as she follows up with her specialist regarding interstitial lung disease she is at high risk for Kathleen [name] then we did discuss  protection mechanisms

## 2019-04-19 DIAGNOSIS — J849 Interstitial pulmonary disease, unspecified: Secondary | ICD-10-CM | POA: Diagnosis not present

## 2019-04-19 DIAGNOSIS — D860 Sarcoidosis of lung: Secondary | ICD-10-CM | POA: Diagnosis not present

## 2019-04-19 DIAGNOSIS — J9601 Acute respiratory failure with hypoxia: Secondary | ICD-10-CM | POA: Diagnosis not present

## 2019-04-19 DIAGNOSIS — M6281 Muscle weakness (generalized): Secondary | ICD-10-CM | POA: Diagnosis not present

## 2019-04-27 ENCOUNTER — Other Ambulatory Visit: Payer: Self-pay | Admitting: Family Medicine

## 2019-04-27 MED FILL — HYDROCORTISONE 10 MG TABLET: 10 | 25 days supply | Qty: 75 | Fill #1

## 2019-04-27 MED FILL — ONDANSETRON ODT 8 MG TABLET: 8 | 4 days supply | Qty: 12 | Fill #4

## 2019-04-27 MED FILL — AMITIZA 24 MCG CAPSULES: 24 | 30 days supply | Qty: 60 | Fill #3

## 2019-04-28 ENCOUNTER — Telehealth: Payer: Self-pay | Admitting: "Endocrinology

## 2019-04-28 MED FILL — PROMETHAZINE 25 MG TABLET: 25 | 8 days supply | Qty: 25 | Fill #0

## 2019-04-28 NOTE — Telephone Encounter (Signed)
It is not her thyroid , nor her adrenal gland. She has to continue her meds same way, suggest eating slowly ( may be 4-5 small meals) than large meals. Keep her next appointment.

## 2019-04-28 NOTE — Telephone Encounter (Signed)
Pt is stating that she thinks something is wrong with her thyroid or her adrenal insufficieny. She said she is getting full really fast, she said that she ate a pudding cup this morning for breakfast and she was full. She wanted an appt but wanted to know if she would need blood work first

## 2019-04-29 ENCOUNTER — Ambulatory Visit (INDEPENDENT_AMBULATORY_CARE_PROVIDER_SITE_OTHER): Payer: 59 | Admitting: Family Medicine

## 2019-04-29 ENCOUNTER — Other Ambulatory Visit: Payer: Self-pay

## 2019-04-29 DIAGNOSIS — J849 Interstitial pulmonary disease, unspecified: Secondary | ICD-10-CM

## 2019-04-29 DIAGNOSIS — R6881 Early satiety: Secondary | ICD-10-CM | POA: Diagnosis not present

## 2019-04-29 NOTE — Progress Notes (Addendum)
Subjective:    Patient ID: Kathleen [name], female    DOB: [birth year], 58 y.o.   MRN: [id number] Virtual Visit via Video Note  I connected with Kathleen [name] on 05/20/19 at  3:30 PM EDT by a video enabled telemedicine application and verified that I am speaking with the correct person using two identifiers.  Location: Patient: Home Provider: Office   I discussed the limitations of evaluation and management by telemedicine and the availability of in person appointments. The patient expressed understanding and agreed to proceed.  History of Present Illness:    Observations/Objective:   Assessment and Plan:   Follow Up Instructions:    I discussed the assessment and treatment plan with the patient. The patient was provided an opportunity to ask questions and all were answered. The patient agreed with the plan and demonstrated an understanding of the instructions.   The patient was advised to call back or seek an in-person evaluation if the symptoms worsen or if the condition fails to improve as anticipated.  I provided 15 minutes of non-face-to-face time during this encounter.   Sallee Lange, MD   HPI Patient with intermittent stomach issues going on for months she is seen the GI doctor.  They have tried various measures.  In addition to this patient has had an ultrasound she has had gastric emptying study she has had EGD none of this is seem to gotten to the root of what is going on she denies any major setbacks.  She states she is only able to eat a small amount and then she gets full.  Patient calls with stomach issues for a while. Patient states she gets full easy and has ongoing issues with her bowels.  Patient also concerned about oral surgery. Patient states she cant stand pain or the shot for numbing and they said they could put her to sleep and she wanted you to know with her lung issues.  Virtual Visit via Video Note  I connected with Kathleen [name] on 04/29/19 at   3:30 PM EDT by a video enabled telemedicine application and verified that I am speaking with the correct person using two identifiers.  Location: Patient: home Provider: office  I discussed the limitations of evaluation and management by telemedicine and the availability of in person appointments. The patient expressed understanding and agreed to proceed.  History of Present Illness:    Observations/Objective:   Assessment and Plan:   Follow Up Instructions:    I discussed the assessment and treatment plan with the patient. The patient was provided an opportunity to ask questions and all were answered. The patient agreed with the plan and demonstrated an understanding of the instructions.   The patient was advised to call back or seek an in-person evaluation if the symptoms worsen or if the condition fails to impro1ve as anticipated.  I provided 17 minutes of non-face-to-face time during this encounter.     Review of Systems Patient relates stomach fullness stomach intermittent nausea denies diarrhea   Denies fever chills sweats wheezing difficulty breathing does have history of chronic lung disease no COVID symptoms Objective:   Physical Exam  Today's visit was via telephone Physical exam was not possible for this visit       Assessment & Plan:  Dyspepsia I recommend small meals frequently If progressive troubles or worse to follow-up Warning signs were discussed  Patient also to have a dental procedure coming up she states that nerve root is going  to have to be cut out I did call and talk to the dental office they will have the dentist call me regarding this patient with the patient underlying condition if they do deep sedation they will need to monitor her oxygen but I believe the patient can safely do this.  I do not feel the patient would tolerate just a nerve block alone

## 2019-04-29 NOTE — Telephone Encounter (Signed)
Pt.notified

## 2019-05-07 MED FILL — PROMETHAZINE 25 MG TABLET: 25 | 8 days supply | Qty: 25 | Fill #0

## 2019-05-12 MED FILL — oxyCODONE HCL 10 MG TABS: 10 | 30 days supply | Qty: 150 | Fill #0

## 2019-05-12 MED FILL — LEVOTHYROXINE 75 MCG TABLET: 75 | 30 days supply | Qty: 30 | Fill #2

## 2019-05-12 MED FILL — MIDODRINE HCL 5 MG TABLET: 5 | 30 days supply | Qty: 60 | Fill #1

## 2019-05-12 MED FILL — PANTOPRAZOLE SOD DR 40 MG T: 40 | 90 days supply | Qty: 90 | Fill #2

## 2019-05-13 ENCOUNTER — Ambulatory Visit (INDEPENDENT_AMBULATORY_CARE_PROVIDER_SITE_OTHER): Payer: 59 | Admitting: Family Medicine

## 2019-05-13 DIAGNOSIS — E2740 Unspecified adrenocortical insufficiency: Secondary | ICD-10-CM | POA: Diagnosis not present

## 2019-05-13 DIAGNOSIS — G4734 Idiopathic sleep related nonobstructive alveolar hypoventilation: Secondary | ICD-10-CM

## 2019-05-13 NOTE — Progress Notes (Signed)
   Subjective:    Patient ID: Kathleen [name], female    DOB: 08/16/1961, 58 y.o.   MRN: [id number]  HPI Face-to-face evaluation was completed today Patient would benefit from ongoing oxygen therapy See discussion below Patient calls and would like her oxygen changed to a different company. Patient currently using [facility] and had a problem with her service on Friday and wants to switch to [place] patient states she is not feeling well today -having nausea and diarrhea. Patient does have underlying problems with adrenal insufficiency she does try to follow the directions of Dr. Lum Babe.  She does take her medicines on a regular basis  She does have pulmonary fibrosis and is on chronic oxygen.  Back in 2017 patient was on ventilator afterwards she had severe nocturnal hypoxia and would also have hypoxia intermittently during the day she benefits from having 2 L of O2 per nasal cannula Virtual Visit via Video Note  I connected with Kathleen [name] on 05/13/19 at 11:30 AM EDT by a video enabled telemedicine application and verified that I am speaking with the correct person using two identifiers.  Location: Patient: home Provider: office   I discussed the limitations of evaluation and management by telemedicine and the availability of in person appointments. The patient expressed understanding and agreed to proceed.  History of Present Illness:    Observations/Objective:   Assessment and Plan:   Follow Up Instructions:    I discussed the assessment and treatment plan with the patient. The patient was provided an opportunity to ask questions and all were answered. The patient agreed with the plan and demonstrated an understanding of the instructions.   The patient was advised to call back or seek an in-person evaluation if the symptoms worsen or if the condition fails to improve as anticipated.  I provided 17 minutes of non-face-to-face time during this encounter.    Mitzie Na, RN   Review of Systems  Constitutional: Negative for activity change, appetite change and fatigue.  HENT: Negative for congestion and rhinorrhea.   Respiratory: Positive for shortness of breath. Negative for cough and wheezing.   Cardiovascular: Negative for chest pain and leg swelling.  Gastrointestinal: Positive for diarrhea and nausea. Negative for abdominal pain.  Endocrine: Negative for polydipsia and polyphagia.  Skin: Negative for color change.  Neurological: Negative for dizziness and weakness.  Psychiatric/Behavioral: Negative for behavioral problems and confusion.       Objective:   Physical Exam   Today's visit was via telephone Physical exam was not possible for this visit      Assessment & Plan:  Adrenal insufficiency Continue her current medications Zofran for nausea If progressive troubles or worse it would be highly important to go to the ER for IV fluids  Nocturnal hypoxia she benefits from ongoing O2 I recommend continuing 2 L per nasal cannula follow-up if any progressive troubles or worse we will try to get her established with [employer]

## 2019-05-15 ENCOUNTER — Telehealth: Payer: Self-pay | Admitting: Family Medicine

## 2019-05-15 ENCOUNTER — Other Ambulatory Visit: Payer: Self-pay

## 2019-05-15 ENCOUNTER — Emergency Department (HOSPITAL_COMMUNITY)
Admission: EM | Admit: 2019-05-15 | Discharge: 2019-05-15 | Discharge status: Against Medical Advice | Payer: 59 | Attending: Emergency Medicine | Admitting: Emergency Medicine

## 2019-05-15 ENCOUNTER — Encounter (HOSPITAL_COMMUNITY): Payer: Self-pay

## 2019-05-15 DIAGNOSIS — Z5321 Procedure and treatment not carried out due to patient leaving prior to being seen by health care provider: Secondary | ICD-10-CM | POA: Diagnosis not present

## 2019-05-15 DIAGNOSIS — L989 Disorder of the skin and subcutaneous tissue, unspecified: Secondary | ICD-10-CM | POA: Diagnosis present

## 2019-05-15 NOTE — Telephone Encounter (Signed)
Spoke with [facility]. Patient's insurance will not pay to get her oxygen thru them. [facility] is reaching out to the patient to inform her of this today.

## 2019-05-15 NOTE — Telephone Encounter (Signed)
Patient came in office stating she needs a prescription called in for oxygen at [facility] now because she can no longer use [facility]. She wanted someone in our area.She needs prescription for POC called into [place]

## 2019-05-15 NOTE — ED Triage Notes (Signed)
Pt reports skin discoloration to medial aspect of right ring finger that she noticed today.  Appears like ecchymosis, however pt denies injury and pain. Cap refill < 3. No other symptoms reported.

## 2019-05-17 NOTE — Telephone Encounter (Signed)
I advised the patient stick with whoever her insurance company covers We will not be able to change healthcare rules

## 2019-05-18 NOTE — Telephone Encounter (Signed)
Pt contacted and verbalized understanding. Pt states that Kathleen [name] did reach out to her.

## 2019-05-21 ENCOUNTER — Telehealth: Payer: Self-pay | Admitting: Family Medicine

## 2019-05-21 NOTE — Telephone Encounter (Signed)
So now I received a form for her to get oxygen from [facility]  On a previous phone message it was stated that the patient could not get it there because of her insurance  Before we go through the hassle of filling out another form please clarify is this form really legitimate?

## 2019-05-22 NOTE — Telephone Encounter (Signed)
Spoke with [facility]-pulmonary dme- please shred the form -Patient's insurance does not allow her to get her oxygen there and they have notified patient of this.

## 2019-05-22 NOTE — Telephone Encounter (Signed)
Thank you :)

## 2019-05-28 MED FILL — HYDROCORTISONE 10 MG TABLET: 10 | 25 days supply | Qty: 75 | Fill #2

## 2019-06-09 ENCOUNTER — Other Ambulatory Visit: Payer: Self-pay

## 2019-06-09 ENCOUNTER — Ambulatory Visit (INDEPENDENT_AMBULATORY_CARE_PROVIDER_SITE_OTHER): Payer: 59 | Admitting: Obstetrics and Gynecology

## 2019-06-09 ENCOUNTER — Encounter: Payer: Self-pay | Admitting: Obstetrics and Gynecology

## 2019-06-09 ENCOUNTER — Other Ambulatory Visit (HOSPITAL_COMMUNITY)
Admission: RE | Admit: 2019-06-09 | Discharge: 2019-06-09 | Disposition: A | Discharge status: Home / Self Care | Payer: 59 | Source: Ambulatory Visit | Attending: Obstetrics and Gynecology | Admitting: Obstetrics and Gynecology

## 2019-06-09 VITALS — BP 119/75 | HR 80 | Ht 70.0 in | Wt 217.8 lb

## 2019-06-09 DIAGNOSIS — Z780 Asymptomatic menopausal state: Secondary | ICD-10-CM | POA: Insufficient documentation

## 2019-06-09 DIAGNOSIS — Z01419 Encounter for gynecological examination (general) (routine) without abnormal findings: Secondary | ICD-10-CM | POA: Diagnosis not present

## 2019-06-09 DIAGNOSIS — Z1151 Encounter for screening for human papillomavirus (HPV): Secondary | ICD-10-CM | POA: Insufficient documentation

## 2019-06-09 NOTE — Progress Notes (Signed)
Patient ID: Kathleen [name], female   DOB: July 27, 1961, 58 y.o.   MRN: [id number]  Assessment:  Annual Gyn Exam Situational stressors Sarcoidosis with pulmonary fibrosis  Plan:  1. pap smear done, next pap due 5 years 2. return annually or prn 3    Annual mammogram advised after age 55 Subjective:  Kathleen [name] is a 58 y.o. female G1P1000 who presents for annual exam. Patient's last menstrual period was 05/17/2016. The patient has complaints today of life and situational stressor. Pt not wearing mask during appointment. Says she isn't able to breathe.  Has restrictive lung disease and uses oxygen at night, stays at home and doesn't leave home unless she has to. She sister in law recently contracted COVID and she hasn't seen her recently. Was working until 2014 until she had to go back on disability.Does not see father who is 97 with Covid, in [place] x 2 wk, Very cautious.  The following portions of the patient's history were reviewed and updated as appropriate: allergies, current medications, past family history, past medical history, past social history, past surgical history and problem list. Past Medical History:  Diagnosis Date  . Adrenal insufficiency ([place])   . Anemia   . Avascular bone necrosis ([place])   . Breast fibrocystic disorder   . Chronic pain   . Fibromyalgia   . Gastroesophageal reflux disease   . Mitral valve prolapse   . Orthostatic hypotension   . Peripheral neuropathy   . Restrictive lung disease    Moderate to severe  . Sarcoidosis    Biopsy proven - [facility]  . SOB (shortness of breath)    chronic    Past Surgical History:  Procedure Laterality Date  . BIOPSY  10/17/2018   Procedure: BIOPSY;  Surgeon: Rogene Houston, MD;  Location[facility];  Service: Endoscopy;;  duodenum, antrum, gastric body  . BREAST LUMPECTOMY  01/12/2011   right  . ESOPHAGOGASTRODUODENOSCOPY (EGD) WITH PROPOFOL N/A 10/17/2018   Procedure: ESOPHAGOGASTRODUODENOSCOPY (EGD) WITH  PROPOFOL;  Surgeon: Rogene Houston, MD;  Location[facility];  Service: Endoscopy;  Laterality: N/A;  2:25  . TUBAL LIGATION       Current Outpatient Medications:  .  acetaminophen (TYLENOL) 500 MG tablet, Take 1,000 mg by mouth every 8 (eight) hours as needed for headache., Disp: , Rfl:  .  albuterol (PROVENTIL) (2.5 MG/3ML) 0.083% nebulizer solution, Take 3 mLs (2.5 mg total) by nebulization every 2 (two) hours as needed for wheezing or shortness of breath., Disp: 75 mL, Rfl: 12 .  budesonide (PULMICORT) 0.5 MG/2ML nebulizer solution, Take 2 mLs (0.5 mg total) by nebulization 2 (two) times daily., Disp: , Rfl: 6 .  Cholecalciferol (VITAMIN D3) 125 MCG (5000 UT) CAPS, Take 1 capsule (5,000 Units total) by mouth daily., Disp: 90 capsule, Rfl: 0 .  EPINEPHrine 0.3 mg/0.3 mL IJ SOAJ injection, Inject 0.3 mLs (0.3 mg total) into the muscle as needed for anaphylaxis., Disp: 1 Device, Rfl: 1 .  ferrous sulfate 325 (65 FE) MG EC tablet, Take 325 mg by mouth daily with breakfast., Disp: , Rfl:  .  hydrocortisone (CORTEF) 10 MG tablet, TAKE 2 TABLETS BY MOUTH AT 8AM AND 1 TABLET AT 1PM, Disp: 75 tablet, Rfl: 2 .  levothyroxine (SYNTHROID) 75 MCG tablet, TAKE 1 TABLET BY MOUTH DAILY BEFORE BREAKFAST., Disp: 30 tablet, Rfl: 6 .  lubiprostone (AMITIZA) 24 MCG capsule, Take 1 capsule (24 mcg total) by mouth 2 (two) times daily with a  meal., Disp: 60 capsule, Rfl: 5 .  midodrine (PROAMATINE) 5 MG tablet, TAKE 1 TABLET BY MOUTH TWICE DAILY WITH A MEAL, Disp: 60 tablet, Rfl: 4 .  ondansetron (ZOFRAN-ODT) 8 MG disintegrating tablet, DISSOLVE 1 TABLET BY MOUTH EVERY 8 HOURS AS NEEDED FOR NAUSEA OR VOMITING, Disp: 30 tablet, Rfl: 5 .  Oxycodone HCl 10 MG TABS, Take one tablet po q 4 hrs prn pain. Max 5 per day, Disp: 150 tablet, Rfl: 0 .  Oxycodone HCl 10 MG TABS, Take one tablet po q 4 hrs prn pain. Max 5 per day, Disp: 150 tablet, Rfl: 0 .  Oxycodone HCl 10 MG TABS, Take one tablet po q 4 hrs prn pain. Max  5 per day, Disp: 150 tablet, Rfl: 0 .  pantoprazole (PROTONIX) 40 MG tablet, Take 1 tablet (40 mg total) by mouth daily., Disp: 90 tablet, Rfl: 3 .  predniSONE (DELTASONE) 10 MG tablet, Take 1 tablet (10 mg total) by mouth daily with breakfast., Disp: 30 tablet, Rfl: 2 .  promethazine (PHENERGAN) 25 MG tablet, TAKE 1 TABLET BY MOUTH EVERY 8 HOURS AS NEEDED FOR NAUSEA OR VOMITING, Disp: 25 tablet, Rfl: 3  Review of Systems Constitutional: negative Gastrointestinal: negative Genitourinary: normal  Objective:  LMP 05/17/2016    BMI: There is no height or weight on file to calculate BMI.  General Appearance: Alert, appropriate appearance for age. No acute distress HEENT: Grossly normal Neck / Thyroid:  Cardiovascular: RRR; normal S1, S2, no murmur Lungs: CTA bilaterally Back: No CVAT Breast Exam: Normal breast tissue bilaterally and No masses or nodes.No dimpling, nipple retraction or discharge. Gastrointestinal: Soft, non-tender, no masses or organomegaly Pelvic Exam:  VULVA: normal appearing vulva with no masses, tenderness or lesions,  VAGINA: normal appearing vagina CERVIX: normal appearing cervix, slight bleeding from brush specimen  UTERUS: uterus is normal size, shape, consistency and nontender,  RECTAL: guaiac negative stool obtained, stool collected was  dark w/ neg results for heme PAP: Pap smear done today. Lymphatic Exam: Non-palpable nodes in neck, clavicular, axillary, or inguinal regions  Skin: no rash or abnormalities Neurologic: Normal gait and speech, no tremor  Psychiatric: Alert and oriented, appropriate affect.  Urinalysis:Not done  By signing my name below, I, Samul Dada, attest that this documentation has been prepared under the direction and in the presence of Jonnie Kind, MD. Electronically Signed: [place]. 06/09/19. 3:06 PM.  I personally performed the services described in this documentation, which was SCRIBED in my presence. The  recorded information has been reviewed and considered accurate. It has been edited as necessary during review. Jonnie Kind, MD

## 2019-06-12 DIAGNOSIS — R0602 Shortness of breath: Secondary | ICD-10-CM | POA: Diagnosis not present

## 2019-06-12 MED FILL — LEVOTHYROXINE 75 MCG TABLET: 75 | 30 days supply | Qty: 30 | Fill #3

## 2019-06-12 MED FILL — MIDODRINE HCL 5 MG TABLET: 5 | 30 days supply | Qty: 60 | Fill #2

## 2019-06-12 MED FILL — oxyCODONE HCL 10 MG TABS: 10 | 30 days supply | Qty: 150 | Fill #0

## 2019-06-16 DIAGNOSIS — J849 Interstitial pulmonary disease, unspecified: Secondary | ICD-10-CM | POA: Diagnosis not present

## 2019-06-16 DIAGNOSIS — D860 Sarcoidosis of lung: Secondary | ICD-10-CM | POA: Diagnosis not present

## 2019-06-16 DIAGNOSIS — R0602 Shortness of breath: Secondary | ICD-10-CM | POA: Diagnosis not present

## 2019-06-16 LAB — CYTOLOGY - PAP
Comment: NEGATIVE
Diagnosis: NEGATIVE
High risk HPV: NEGATIVE

## 2019-06-22 ENCOUNTER — Ambulatory Visit (INDEPENDENT_AMBULATORY_CARE_PROVIDER_SITE_OTHER): Payer: 59 | Admitting: Family Medicine

## 2019-06-22 ENCOUNTER — Other Ambulatory Visit: Payer: Self-pay

## 2019-06-22 ENCOUNTER — Encounter: Payer: Self-pay | Admitting: Family Medicine

## 2019-06-22 VITALS — BP 124/76 | Temp 97.6°F | Ht 70.0 in | Wt 221.0 lb

## 2019-06-22 DIAGNOSIS — L02419 Cutaneous abscess of limb, unspecified: Secondary | ICD-10-CM | POA: Diagnosis not present

## 2019-06-22 MED ORDER — DOXYCYCLINE HYCLATE 100 MG PO TABS: 100.0000 mg | ORAL_TABLET | Freq: Two times a day (BID) | ORAL | 0 refills | Status: DC

## 2019-06-22 NOTE — Progress Notes (Signed)
   Subjective:    Patient ID: Kathleen [name], female    DOB: [birth year], 58 y.o.   MRN: [id number]  HPI painful knot under right arm. Came up 3 days ago.  Patient with developing abscess underneath right arm present over the past few days painful discomforting denies fever chills sweats no Covid symptoms.  PMH benign  Review of Systems See above.  Denies any chest congestion tightness lung issue is stable denies any chest pressure pain    Objective:   Physical Exam  Lungs clear respiratory rate normal heart regular abscess noted underneath the right arm moderate sized      Assessment & Plan:  After discussion with the patient she consents to have this area numbed and lanced  Surgical procedure area was numbed with 1% lidocaine with epinephrine.  Cleansed with Betadine sterile technique #11 blade was used 7 mm cut was made moderate amount of pus obtained small packing placed without trouble no bleeding issues patient tolerated procedure well  Warm compresses frequently Continue same pain medicine Doxycycline twice daily 10 days sent in Probably will need to take for 5 to 7 days Follow-up 24 hours to have packing removed

## 2019-06-23 ENCOUNTER — Ambulatory Visit (INDEPENDENT_AMBULATORY_CARE_PROVIDER_SITE_OTHER): Payer: 59 | Admitting: Family Medicine

## 2019-06-23 DIAGNOSIS — L03111 Cellulitis of right axilla: Secondary | ICD-10-CM

## 2019-06-23 NOTE — Progress Notes (Signed)
   Subjective:    Patient ID: Kathleen [name], female    DOB: [birth year], 58 y.o.   MRN: [id number]  HPI Follow up axillary abscess.  This patient had an abscess we placed packing yesterday she is here today to have it removed she denies any particular fevers or chills but does state the area is tender to the touch she denies any other particular troubles or problems  Review of Systems Relates a little localized pain and discomfort denies fever chills    Objective:   Physical Exam On examination there is a small amount of pus that is still within the abscess after the packing was removed there is also some associated cellulitis although it does appear improved compared to how it was yesterday       Assessment & Plan:  Abscess Packing removed Dressing placed Recheck patient in 48 hours Doxycycline twice daily ongoing follow-up if ongoing troubles warning signs were discussed

## 2019-06-25 ENCOUNTER — Ambulatory Visit: Payer: Medicare Other | Admitting: Family Medicine

## 2019-06-29 ENCOUNTER — Encounter: Payer: Self-pay | Admitting: Family Medicine

## 2019-06-29 ENCOUNTER — Other Ambulatory Visit: Payer: Self-pay

## 2019-06-29 ENCOUNTER — Ambulatory Visit (INDEPENDENT_AMBULATORY_CARE_PROVIDER_SITE_OTHER): Payer: 59 | Admitting: Family Medicine

## 2019-06-29 VITALS — BP 124/84 | Temp 97.5°F | Wt 213.9 lb

## 2019-06-29 DIAGNOSIS — R5383 Other fatigue: Secondary | ICD-10-CM

## 2019-06-29 DIAGNOSIS — E785 Hyperlipidemia, unspecified: Secondary | ICD-10-CM

## 2019-06-29 DIAGNOSIS — E2740 Unspecified adrenocortical insufficiency: Secondary | ICD-10-CM | POA: Diagnosis not present

## 2019-06-29 DIAGNOSIS — Z1231 Encounter for screening mammogram for malignant neoplasm of breast: Secondary | ICD-10-CM

## 2019-06-29 DIAGNOSIS — E039 Hypothyroidism, unspecified: Secondary | ICD-10-CM

## 2019-06-29 MED ORDER — OXYCODONE HCL 10 MG PO TABS: ORAL_TABLET | ORAL | 0 refills | Status: DC

## 2019-06-29 MED FILL — predniSONE 5 MG TABS: 5 | 90 days supply | Qty: 180 | Fill #1

## 2019-06-29 MED FILL — HYDROCORTISONE 10 MG TABLET: 10 | 25 days supply | Qty: 75 | Fill #0

## 2019-06-29 NOTE — Progress Notes (Addendum)
   Subjective:    Patient ID: Kathleen [name], female    DOB: 03-02-1961, 58 y.o.   MRN: [id number]  HPI Patient arrives for a follow up on right axilla cellulitis. Patient states the area is doing better.  Review of Systems     Objective:   Physical Exam Neck no masses lungs CTA respiratory rate normal heart regular no murmurs skin warm dry pulses are normal extremities no edema patient does have scar tissue around the area of the abscess which was drained.       Assessment & Plan:  Axilla cellulitis actually doing better does have some scar tissue underneath I do not feel that this is a sign of any type of breast cancer  Patient is due for mammogram I have encouraged her strongly to get this completed  If the area where the abscess was does not completely resolve over the next month she is to follow-up  Chronic low back pain as well as hip pain unfortunately she has been on pain medicine for a long span of time and currently it is felt that the medicine helps but does not completely minimize the pain the way it used to she would like to go up to 6/day which I think is fine as long as it does not cause drowsiness therefore we did go ahead and change the prescriptions  She will follow-up in February  It should also be noted that this patient does suffer with neurogenic bladder.  It is necessary for her to self catheterize herself multiple times per day It is felt that this condition will be lifelong.  Unfortunately urology has not been able to come up with any other tenable solutions other than self catheterizations.  Patient will do lab work in the near future to look at her cholesterol has history of hyperlipidemia diet was recommended  She will continue follow-up with her pulmonary specialist as well as her endocrinologist.  The pulmonary doctor has her on prednisone and she is on hydrocortisone by the endocrinology his best I can tell this is perfectly necessary but I will touch  base with endocrinology to see if they feel there needs to be any change in how this is done

## 2019-06-30 LAB — VITAMIN D 25 HYDROXY (VIT D DEFICIENCY, FRACTURES): Vit D, 25-Hydroxy: 20.3 ng/mL — ABNORMAL LOW (ref 30.0–100.0)

## 2019-06-30 LAB — LIPID PANEL
Chol/HDL Ratio: 3.9 ratio (ref 0.0–4.4)
Cholesterol, Total: 227 mg/dL — ABNORMAL HIGH (ref 100–199)
HDL: 58 mg/dL (ref >39)
LDL Chol Calc (NIH): 130 mg/dL — ABNORMAL HIGH (ref 0–99)
Triglycerides: 222 mg/dL — ABNORMAL HIGH (ref 0–149)
VLDL Cholesterol Cal: 39 mg/dL (ref 5–40)

## 2019-06-30 LAB — BASIC METABOLIC PANEL WITH GFR
BUN/Creatinine Ratio: 14 (ref 9–23)
BUN: 13 mg/dL (ref 6–24)
CO2: 20 mmol/L (ref 20–29)
Calcium: 9.9 mg/dL (ref 8.7–10.2)
Chloride: 102 mmol/L (ref 96–106)
Creatinine, Ser: 0.93 mg/dL (ref 0.57–1.00)
GFR calc Af Amer: 79 mL/min/{1.73_m2}
GFR calc non Af Amer: 69 mL/min/{1.73_m2}
Glucose: 88 mg/dL (ref 65–99)
Potassium: 4.7 mmol/L (ref 3.5–5.2)
Sodium: 138 mmol/L (ref 134–144)

## 2019-06-30 LAB — T4, FREE: Free T4: 1.43 ng/dL (ref 0.82–1.77)

## 2019-06-30 LAB — TSH: TSH: 0.364 u[IU]/mL — ABNORMAL LOW (ref 0.450–4.500)

## 2019-07-07 ENCOUNTER — Other Ambulatory Visit: Payer: Self-pay | Admitting: *Deleted

## 2019-07-07 MED ORDER — LEVOTHYROXINE SODIUM 75 MCG PO TABS: ORAL_TABLET | ORAL | 1 refills | Status: DC

## 2019-07-07 MED ORDER — VITAMIN D (ERGOCALCIFEROL) 1.25 MG (50000 UNIT) PO CAPS: 50000.0000 [IU] | ORAL_CAPSULE | ORAL | 2 refills | Status: DC

## 2019-07-07 MED FILL — LEVOTHYROXINE 75 MCG TABLET: 75 | 90 days supply | Qty: 84 | Fill #0

## 2019-07-08 ENCOUNTER — Encounter: Payer: Self-pay | Admitting: Family Medicine

## 2019-07-08 ENCOUNTER — Other Ambulatory Visit: Payer: Self-pay | Admitting: "Endocrinology

## 2019-07-08 ENCOUNTER — Ambulatory Visit (INDEPENDENT_AMBULATORY_CARE_PROVIDER_SITE_OTHER): Payer: 59 | Admitting: Family Medicine

## 2019-07-08 ENCOUNTER — Telehealth: Payer: Self-pay | Admitting: Family Medicine

## 2019-07-08 ENCOUNTER — Other Ambulatory Visit: Payer: Self-pay

## 2019-07-08 DIAGNOSIS — E2740 Unspecified adrenocortical insufficiency: Secondary | ICD-10-CM

## 2019-07-08 DIAGNOSIS — R110 Nausea: Secondary | ICD-10-CM

## 2019-07-08 MED ORDER — HYDROCORTISONE NA SUCCINATE PF 250 MG IJ SOLR: 250.0000 mg | Freq: Once | INTRAMUSCULAR | 99 refills | Status: AC

## 2019-07-08 NOTE — Telephone Encounter (Signed)
Nurses Please communicate with Kathleen [name] that I did communicate with Dr. Dorris Fetch  He recommends for the next 3 to 5 days to increase her hydrocortisone Current dose 10 mg tablet, taking 2 tablets in the morning and 1 tablet at lunch  For the next 3 to 5 days she should take 4 tablets in the morning and 2 tablets at lunch  If she gets to the point where she cannot keep anything down she may need to go get injection of steroid via urgent care ER at the hospital

## 2019-07-08 NOTE — Progress Notes (Signed)
   Subjective:    Patient ID: Kathleen [name], female    DOB: [birth year], 58 y.o.   MRN: [id number]  HPI Pt has been nauseated. The past couple day pt has been nauseated. Has been taking Zofran.  Relates a lot of nausea feels sort of rundown denies any abdominal pain denies sweats chills fever has this problem quite frequently sometimes bad not had to go to the ER no other particular troubles   Last week pt went to aunts house and two of her aunts friends tested positive for COVID. Pt has not shown any symptoms at this time.   Virtual Visit via Telephone Note  I connected with Kathleen [name] on 07/08/19 at 11:00 AM EST by telephone and verified that I am speaking with the correct person using two identifiers.  Location: Patient: home Provider: office   I discussed the limitations, risks, security and privacy concerns of performing an evaluation and management service by telephone and the availability of in person appointments. I also discussed with the patient that there may be a patient responsible charge related to this service. The patient expressed understanding and agreed to proceed.   History of Present Illness:    Observations/Objective:   Assessment and Plan:   Follow Up Instructions:    I discussed the assessment and treatment plan with the patient. The patient was provided an opportunity to ask questions and all were answered. The patient agreed with the plan and demonstrated an understanding of the instructions.   The patient was advised to call back or seek an in-person evaluation if the symptoms worsen or if the condition fails to improve as anticipated.  I provided 15 minutes of non-face-to-face time during this encounter.   Vicente Males, LPN  Review of Systems  Constitutional: Negative for activity change, appetite change and fatigue.  HENT: Negative for congestion and rhinorrhea.   Respiratory: Negative for cough and shortness of breath.   Cardiovascular:  Negative for chest pain and leg swelling.  Gastrointestinal: Positive for nausea. Negative for abdominal pain and diarrhea.  Endocrine: Negative for polydipsia and polyphagia.  Skin: Negative for color change.  Neurological: Negative for dizziness and weakness.  Psychiatric/Behavioral: Negative for behavioral problems and confusion.       Objective:   Physical Exam  Today's visit was via telephone Physical exam was not possible for this visit       Assessment & Plan:  Nausea continue Zofran and Phenergan as needed encourage p.o. intake  Has a history of adrenal insufficiency will send communication to Dr. Dorris Fetch to ask him if the patient should do a increased dose of hydrocortisone for a short span of time to try to help with this?  If patient is not improving over the course next several days may need lab work a.m. further testing  Recommend Covid testing patient should go to the hospital to get this completed at the [place]

## 2019-07-08 NOTE — Telephone Encounter (Signed)
Left message to return call 

## 2019-07-08 NOTE — Telephone Encounter (Signed)
Discussed with pt. Pt verbalized understanding.  °

## 2019-07-10 ENCOUNTER — Other Ambulatory Visit: Payer: Self-pay

## 2019-07-10 ENCOUNTER — Telehealth: Payer: Self-pay | Admitting: Family Medicine

## 2019-07-10 DIAGNOSIS — Z20822 Contact with and (suspected) exposure to covid-19: Secondary | ICD-10-CM

## 2019-07-10 MED FILL — oxyCODONE HCL 10 MG TABS: 10 | 30 days supply | Qty: 180 | Fill #0

## 2019-07-10 MED FILL — ONDANSETRON ODT 8 MG TABLET: 8 | 4 days supply | Qty: 12 | Fill #5

## 2019-07-10 NOTE — Telephone Encounter (Signed)
Pt went and had COVID test done today. She didn't feel well yesterday so she waited. She would like a call next week when her results are in.

## 2019-07-12 IMAGING — DX DG CHEST 2V
2 series · 2 of 2 positions shown · non-contrast
Comparison: August 08, 2017 and October 20, 2015

CLINICAL DATA: Shortness of breath and chest pain. History of
sarcoidosis

EXAM:
CHEST  2 VIEW

[chest lat]
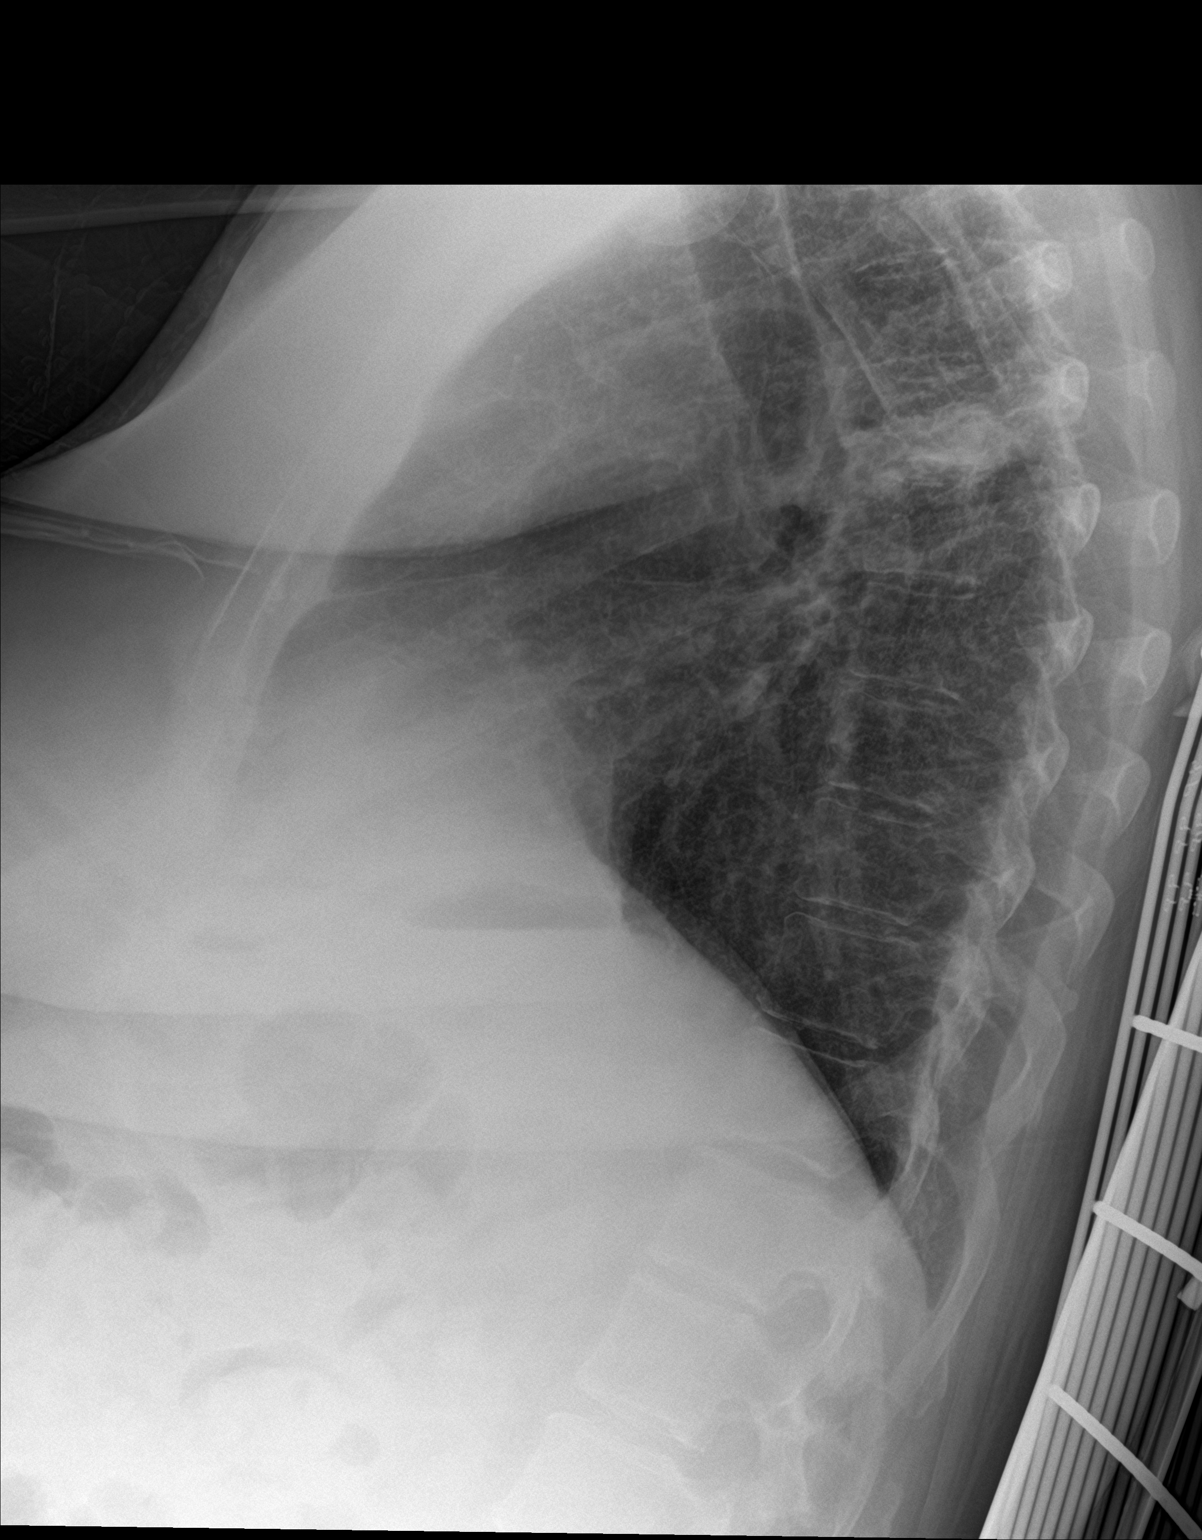

[chest ap]
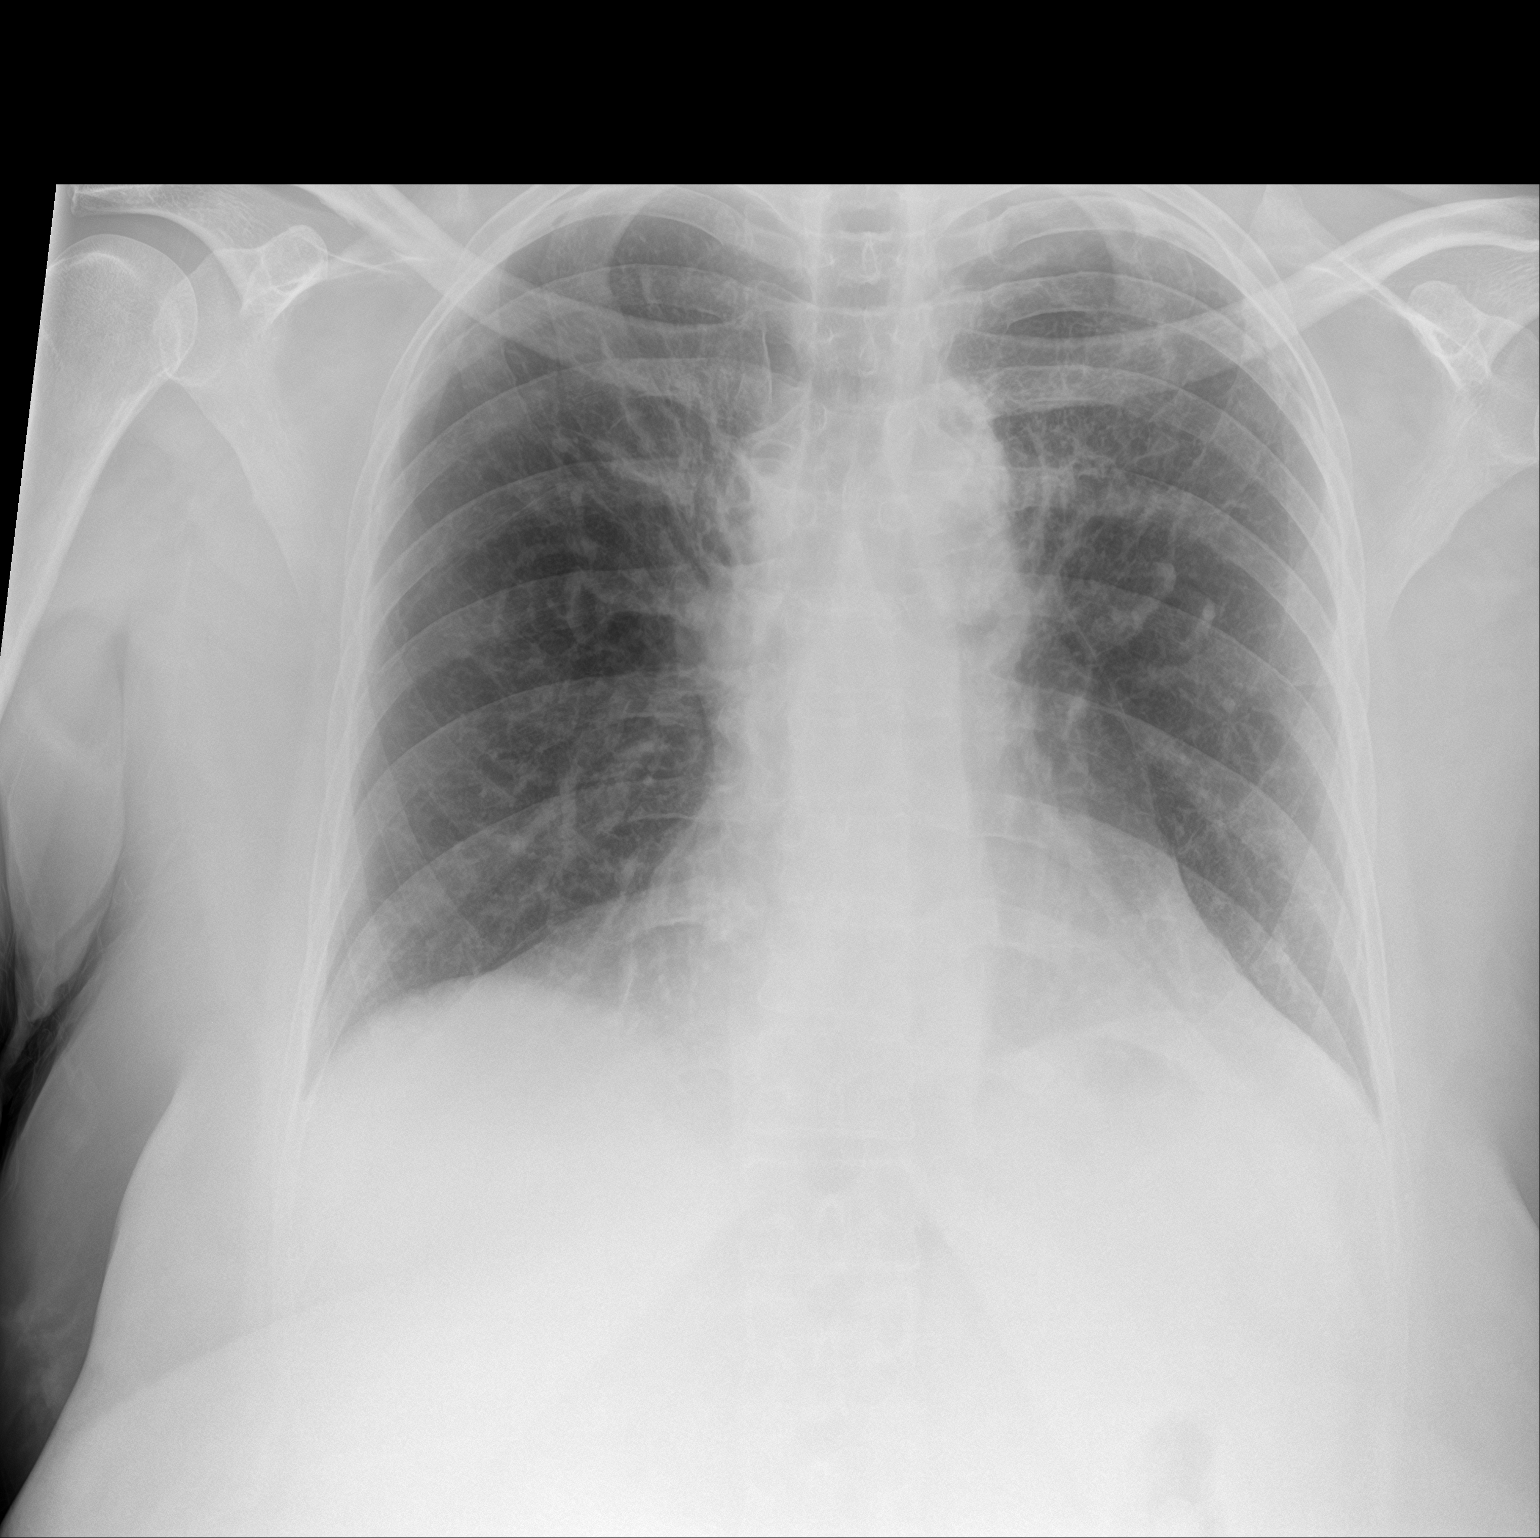

[2 of 2 positions shown; findings below may reference images not displayed]

FINDINGS: There is chronic opacification in the posterior aspect of the left
upper lobe better seen on lateral view. Milder scarring is noted in
the right upper lobe. There is no edema or consolidation. Heart size
and pulmonary vascularity are normal. No adenopathy. No bone
lesions.
IMPRESSION: Chronic scarring, most notable in the posterior segment of the left
upper lobe, likely residua from known chronic sarcoidosis. No new
opacity. Heart size normal. No adenopathy appreciable.

## 2019-07-13 LAB — NOVEL CORONAVIRUS, NAA: SARS-CoV-2, NAA: NOT DETECTED

## 2019-07-13 NOTE — Telephone Encounter (Signed)
Covid test negative please inform patient

## 2019-07-13 NOTE — Telephone Encounter (Signed)
Left message to return call 

## 2019-07-14 NOTE — Telephone Encounter (Signed)
Left message to return call 

## 2019-07-14 NOTE — Telephone Encounter (Signed)
At this point she can go back to her standard dose

## 2019-07-14 NOTE — Telephone Encounter (Signed)
Patient notified of negative covid results and verbalized understanding. Patient wants to know if she continues the increased dose of hydrocortisone or when she goes back to her regular dose.

## 2019-07-15 ENCOUNTER — Other Ambulatory Visit: Payer: Self-pay | Admitting: "Endocrinology

## 2019-07-15 DIAGNOSIS — E2740 Unspecified adrenocortical insufficiency: Secondary | ICD-10-CM

## 2019-07-15 NOTE — Telephone Encounter (Signed)
Patient notified. Patient stated she was slightly nervous to go back to her regular dose for fear she will end up back in the hospital but will go back to that dose and call us if any problems.

## 2019-07-16 DIAGNOSIS — R0602 Shortness of breath: Secondary | ICD-10-CM | POA: Diagnosis not present

## 2019-07-16 DIAGNOSIS — D869 Sarcoidosis, unspecified: Secondary | ICD-10-CM | POA: Diagnosis not present

## 2019-07-16 DIAGNOSIS — D860 Sarcoidosis of lung: Secondary | ICD-10-CM | POA: Diagnosis not present

## 2019-07-16 DIAGNOSIS — J849 Interstitial pulmonary disease, unspecified: Secondary | ICD-10-CM | POA: Diagnosis not present

## 2019-07-19 MED FILL — HYDROCORTISONE 10 MG TABLET: 10 | 25 days supply | Qty: 75 | Fill #0

## 2019-07-20 ENCOUNTER — Ambulatory Visit (HOSPITAL_COMMUNITY)
Admission: RE | Admit: 2019-07-20 | Discharge: 2019-07-20 | Disposition: A | Discharge status: Home / Self Care | Payer: 59 | Source: Ambulatory Visit | Attending: Family Medicine | Admitting: Family Medicine

## 2019-07-20 ENCOUNTER — Other Ambulatory Visit: Payer: Self-pay

## 2019-07-20 DIAGNOSIS — Z1231 Encounter for screening mammogram for malignant neoplasm of breast: Secondary | ICD-10-CM | POA: Insufficient documentation

## 2019-07-22 ENCOUNTER — Other Ambulatory Visit (HOSPITAL_COMMUNITY): Payer: Self-pay | Admitting: Family Medicine

## 2019-07-22 DIAGNOSIS — R928 Other abnormal and inconclusive findings on diagnostic imaging of breast: Secondary | ICD-10-CM

## 2019-07-27 ENCOUNTER — Other Ambulatory Visit: Payer: Self-pay

## 2019-07-27 ENCOUNTER — Encounter: Payer: Self-pay | Admitting: Family Medicine

## 2019-07-27 ENCOUNTER — Ambulatory Visit (INDEPENDENT_AMBULATORY_CARE_PROVIDER_SITE_OTHER): Payer: 59 | Admitting: Family Medicine

## 2019-07-27 DIAGNOSIS — J849 Interstitial pulmonary disease, unspecified: Secondary | ICD-10-CM | POA: Diagnosis not present

## 2019-07-27 DIAGNOSIS — E2740 Unspecified adrenocortical insufficiency: Secondary | ICD-10-CM

## 2019-07-27 DIAGNOSIS — Z20822 Contact with and (suspected) exposure to covid-19: Secondary | ICD-10-CM

## 2019-07-27 DIAGNOSIS — Z20828 Contact with and (suspected) exposure to other viral communicable diseases: Secondary | ICD-10-CM

## 2019-07-27 MED ORDER — CEFPROZIL 500 MG PO TABS: 500.0000 mg | ORAL_TABLET | Freq: Two times a day (BID) | ORAL | 0 refills | Status: DC

## 2019-07-27 NOTE — Progress Notes (Signed)
   Subjective:  Audio  Patient ID: Kathleen [name], female    DOB: October 15, 1960, 58 y.o.   MRN: [id number]  Cough This is a new problem. The current episode started in the past 7 days. Associated symptoms include headaches and nasal congestion. Associated symptoms comments: Fatigue, nausea.   Patient wonders id she needs to go to hospital for shots of steroid since she not able to keep her hydrocortisone tablets down at times   Review of Systems  Respiratory: Positive for cough.   Neurological: Positive for headaches.   Virtual Visit via Video Note  I connected with Kathleen [name] on 07/27/19 at  1:10 PM EST by a video enabled telemedicine application and verified that I am speaking with the correct person using two identifiers.  Location: Patient: home Provider: office    I discussed the limitations of evaluation and management by telemedicine and the availability of in person appointments. The patient expressed understanding and agreed to proceed.  History of Present Illness:    Observations/Objective:   Assessment and Plan:   Follow Up Instructions:    I discussed the assessment and treatment plan with the patient. The patient was provided an opportunity to ask questions and all were answered. The patient agreed with the plan and demonstrated an understanding of the instructions.   The patient was advised to call back or seek an in-person evaluation if the symptoms worsen or if the condition fails to improve as anticipated.  I provided 20 minutes of non-face-to-face time during this encounter.   Patient has known history of Addison's disease.  Unfortunately last 2 days of nausea.  At times has been unable to keep down her steroids.  Uncertain how much she is retained before lost.  Progressive cough past week productive in nature yellowish phlegm at times  Also history of chronic pulmonary disease with prior ventilator support necessary  No known exposures to someone  else sick  Notes achy mild flulike feeling.  Along with substantial cough productive in nature and nasal congestion and gunky  No vomiting no diarrhea no rash    Objective:   Physical Exam   Virtual    Assessment & Plan:  Impression exacerbation of chronic lung disease with sinus and bronchial element.  Unfortunately also has Addison's disease with plus minus intake of steroids recent days.  Warning signs discussed carefully.  Increased steroids by doubling over the next 3 days-this was used in prior illness.  As Cefzil 500 twice daily.  Covid testing tomorrow

## 2019-07-28 ENCOUNTER — Other Ambulatory Visit: Payer: Self-pay

## 2019-07-28 DIAGNOSIS — Z20822 Contact with and (suspected) exposure to covid-19: Secondary | ICD-10-CM

## 2019-07-30 LAB — NOVEL CORONAVIRUS, NAA: SARS-CoV-2, NAA: NOT DETECTED

## 2019-08-03 ENCOUNTER — Other Ambulatory Visit: Payer: Self-pay

## 2019-08-03 ENCOUNTER — Ambulatory Visit (INDEPENDENT_AMBULATORY_CARE_PROVIDER_SITE_OTHER): Payer: 59 | Admitting: "Endocrinology

## 2019-08-03 ENCOUNTER — Encounter: Payer: Self-pay | Admitting: "Endocrinology

## 2019-08-03 DIAGNOSIS — E039 Hypothyroidism, unspecified: Secondary | ICD-10-CM

## 2019-08-03 DIAGNOSIS — E2740 Unspecified adrenocortical insufficiency: Secondary | ICD-10-CM

## 2019-08-03 NOTE — Progress Notes (Signed)
08/03/2019, 1:08 PM                                Endocrinology Telehealth Visit Follow up Note -During COVID -19 Pandemic  I connected with Kathleen [name] on 08/03/2019   by telephone and verified that I am speaking with the correct person using two identifiers. Kathleen [name], [birth year]. she has verbally consented to this visit. All issues noted in this document were discussed and addressed. The format was not optimal for physical exam.   Subjective:    Patient ID: Kathleen [name], female    DOB: 1961-07-13, PCP Kathleen Drown, MD   Past Medical History:  Diagnosis Date  . Adrenal insufficiency ([place])   . Anemia   . Avascular bone necrosis ([place])   . Breast fibrocystic disorder   . Chronic pain   . Fibromyalgia   . Gastroesophageal reflux disease   . Mitral valve prolapse   . Orthostatic hypotension   . Peripheral neuropathy   . Restrictive lung disease    Moderate to severe  . Sarcoidosis    Biopsy proven - [facility]  . SOB (shortness of breath)    chronic   Past Surgical History:  Procedure Laterality Date  . BIOPSY  10/17/2018   Procedure: BIOPSY;  Surgeon: Rogene Houston, MD;  Location: [facility];  Service: Endoscopy;;  duodenum, antrum, gastric body  . BREAST LUMPECTOMY  01/12/2011   right  . ESOPHAGOGASTRODUODENOSCOPY (EGD) WITH PROPOFOL N/A 10/17/2018   Procedure: ESOPHAGOGASTRODUODENOSCOPY (EGD) WITH PROPOFOL;  Surgeon: Rogene Houston, MD;  Location: [facility];  Service: Endoscopy;  Laterality: N/A;  2:25  . TUBAL LIGATION     Social History   Socioeconomic History  . Marital status: Married    Spouse name: Not on file  . Number of children: 1  . Years of education: Not on file  . Highest education level: Not on file  Occupational History  . Occupation: Teacher, early years/pre: [employer]  . Financial resource strain: Not on file  . Food insecurity    Worry: Not on file     Inability: Not on file  . Transportation needs    Medical: Not on file    Non-medical: Not on file  Tobacco Use  . Smoking status: Never Smoker  . Smokeless tobacco: Never Used  Substance and Sexual Activity  . Alcohol use: No  . Drug use: No  . Sexual activity: Yes  Lifestyle  . Physical activity    Days per week: Not on file    Minutes per session: Not on file  . Stress: Not on file  Relationships  . Social Herbalist on phone: Not on file    Gets together: Not on file    Attends religious service: Not on file    Active member of club or organization: Not on file    Attends meetings of clubs or organizations: Not on file    Relationship status: Not on file  Other Topics Concern  . Not on file  Social History Narrative   Daily caffeine  Family History  Problem Relation Age of Onset  . Cardiomyopathy Mother   . Breast cancer Mother   . Prostate cancer Father   . Neurofibromatosis Brother   . Heart disease Maternal Aunt   . Bipolar disorder Daughter   . Colon cancer Neg Hx    Outpatient Encounter Medications as of 08/03/2019  Medication Sig  . acetaminophen (TYLENOL) 500 MG tablet Take 1,000 mg by mouth every 8 (eight) hours as needed for headache.  . albuterol (PROVENTIL) (2.5 MG/3ML) 0.083% nebulizer solution Take 3 mLs (2.5 mg total) by nebulization every 2 (two) hours as needed for wheezing or shortness of breath.  . budesonide (PULMICORT) 0.5 MG/2ML nebulizer solution Take 2 mLs (0.5 mg total) by nebulization 2 (two) times daily.  . cefPROZIL (CEFZIL) 500 MG tablet Take 1 tablet (500 mg total) by mouth 2 (two) times daily.  . Cholecalciferol (VITAMIN D3) 125 MCG (5000 UT) CAPS Take 1 capsule (5,000 Units total) by mouth daily.  Marland Kitchen EPINEPHrine 0.3 mg/0.3 mL IJ SOAJ injection Inject 0.3 mLs (0.3 mg total) into the muscle as needed for anaphylaxis.  . ferrous sulfate 325 (65 FE) MG EC tablet Take 325 mg by mouth daily with breakfast.  . hydrocortisone  (CORTEF) 10 MG tablet TAKE 2 TABLETS BY MOUTH AT 8AM AND 1 TABLET AT 1PM. STOP PREDNISONE.  Marland Kitchen levothyroxine (SYNTHROID) 75 MCG tablet TAKE 1 TABLET BY MOUTH DAILY ON ALL DAYS EXCEPT FOR ONE DAY TAKE A HALF TABLET. TAKE BEFORE BREAKFAST.  Marland Kitchen lubiprostone (AMITIZA) 24 MCG capsule Take 1 capsule (24 mcg total) by mouth 2 (two) times daily with a meal.  . midodrine (PROAMATINE) 5 MG tablet TAKE 1 TABLET BY MOUTH TWICE DAILY WITH A MEAL  . ondansetron (ZOFRAN-ODT) 8 MG disintegrating tablet DISSOLVE 1 TABLET BY MOUTH EVERY 8 HOURS AS NEEDED FOR NAUSEA OR VOMITING  . Oxycodone HCl 10 MG TABS Take one tablet po q 4 hrs prn pain. Max 6 per day  . Oxycodone HCl 10 MG TABS Take one tablet po q 4 hrs prn pain. Max 6 per day  . Oxycodone HCl 10 MG TABS Take one tablet po q 4 hrs prn pain. Max 6 per day  . pantoprazole (PROTONIX) 40 MG tablet Take 1 tablet (40 mg total) by mouth daily.  . promethazine (PHENERGAN) 25 MG tablet TAKE 1 TABLET BY MOUTH EVERY 8 HOURS AS NEEDED FOR NAUSEA OR VOMITING  . Vitamin D, Ergocalciferol, (DRISDOL) 1.25 MG (50000 UT) CAPS capsule Take 1 capsule (50,000 Units total) by mouth every 7 (seven) days.   No facility-administered encounter medications on file as of 08/03/2019.    ALLERGIES: Allergies  Allergen Reactions  . Bee Venom Anaphylaxis  . Other Other (See Comments)    Contrast Dye CT chest w/ contrast at OSH followed by SOB resolved w/ single dose steroids, never ENT swelling, intubation or pressors, has had multiple contrasted scans since  . Codeine Itching  . Morphine And Related Other (See Comments)    Unknown reaction- pt gets sick, dizzy, and confused Intolerance, not an allergy, received this in the emergency department recently without a problem.    VACCINATION STATUS: Immunization History  Administered Date(s) Administered  . Influenza Whole 06/27/2010, 07/28/2011  . Pneumococcal Conjugate-13 06/15/2014  . Pneumococcal Polysaccharide-23 06/10/2013     HPI Kathleen [name] is 58 y.o. female who is engaged in telehealth via telephone for follow-up of adrenal insufficiency and hypothyroidism.   She has no new complaints today.  -  Her history is complicated including sarcoidosis which she dealt with for more than the last 10 years.  She is on regular follow-up with her pulmonologist.  Over the years, her therapy included high-dose steroids, up to 60 mg daily of prednisone, for the last 4-6 years.  She is currently on maintenance dose of prednisone 10 mg p.o. daily.  For continued insufficiency, she was initiated and kept on hydrocortisone 20 mg a.m. and 10 mg p.m.  She has relatively stable course since her last visit. -She is more consistent taking her medications this time.  She also has hypothyroidism on levothyroxine 75 mcg p.o. every morning. She mentions that she has problem following her hip more basic than not and think that she is getting too much steroids.  She avoided hospitalization, she still complains of the same symptoms including fatigue.  However, she has done very well in terms of her GI complaints, no vomiting/diarrhea.    -She has significant cognitive deficit, may be forgetting her medications at times. -Her weight is stable since last visit.   - The details of her diagnosis with adrenal insufficiency  is not available to review. -She is advised to wear medic alert. She denies any history of tuberculosis, incarceration, nor overseas travel . -She denies family history of adrenal, pituitary dysfunction. She has family history of hypothyroidism in one sibling. - Denies any history of intra-abdominal injury. She is on hydrocodone for chronic pain and inhaled bronchodilators. She is known to have orthostatic hypotension for which she is on midodrine.    Review of Systems Limited as above.  Objective:    LMP 05/17/2016   Wt Readings from Last 3 Encounters:  06/29/19 213 lb 14.4 oz (97 kg)  06/22/19 221 lb (100.2 kg)   06/09/19 217 lb 12.8 oz (98.8 kg)    Physical Exam   CMP ( most recent) CMP     Component Value Date/Time   NA 138 06/29/2019 1537   K 4.7 06/29/2019 1537   CL 102 06/29/2019 1537   CO2 20 06/29/2019 1537   GLUCOSE 88 06/29/2019 1537   GLUCOSE 90 11/02/2018 0556   BUN 13 06/29/2019 1537   CREATININE 0.93 06/29/2019 1537   CREATININE 0.85 08/26/2014 0831   CALCIUM 9.9 06/29/2019 1537   PROT 6.7 10/27/2018 1608   ALBUMIN 4.2 10/27/2018 1608   AST 21 10/27/2018 1608   ALT 17 10/27/2018 1608   ALKPHOS 84 10/27/2018 1608   BILITOT 0.3 10/27/2018 1608   GFRNONAA 69 06/29/2019 1537   GFRAA 79 06/29/2019 1537     Diabetic Labs (most recent): Lab Results  Component Value Date   HGBA1C 5.4 02/03/2019   HGBA1C 5.0 12/31/2016   HGBA1C 5.3 08/21/2016     Lipid Panel ( most recent) Lipid Panel     Component Value Date/Time   CHOL 227 (H) 06/29/2019 1537   TRIG 222 (H) 06/29/2019 1537   HDL 58 06/29/2019 1537   CHOLHDL 3.9 06/29/2019 1537   CHOLHDL 4.0 08/26/2014 0831   VLDL 31 08/26/2014 0831   LDLCALC 130 (H) 06/29/2019 1537      Lab Results  Component Value Date   TSH 0.364 (L) 06/29/2019   TSH 0.746 02/03/2019   TSH 1.290 08/07/2018   TSH 2.280 04/17/2018   TSH 0.527 12/04/2017   TSH 0.242 (L) 10/22/2017   TSH 0.769 10/15/2017   TSH 0.545 08/09/2017   TSH 5.190 (H) 04/19/2017   TSH 4.950 (H) 12/31/2016   FREET4 1.43 06/29/2019  FREET4 1.32 02/03/2019   FREET4 1.38 08/09/2017   FREET4 1.15 04/19/2017   FREET4 0.99 12/31/2016      Assessment & Plan:   1. Adrenal insufficiency ([place]) -I have reviewed her available records and clinically evaluated this patient. She does have multiple risk factors for adrenal insufficiency including exposure to high-dose steroids related to her sarcoidosis, as well as opioid therapy related to diffuse pain. -  She has taken prednisone up to 60 mg daily for up to 6 years prior to her diagnosis with adrenal  insufficiency. -She is on a stable dose of prednisone 10 mg p.o. daily related related to her sarcoidosis. -Given her recent symptoms indicative of adequate steroid replacement, hydrocortisone was added.   -For superimposed steroid induced adrenal insufficiency, she is on hydrocortisone supplement.  Due to her complaint of sleeplessness, she is advised to continue only with morning hydrocortisone 20 mg 8 AM and skip her noon hydrocortisone 10 mg.    -She is also advised to wear medical alert affecting her diagnosis and need for steroids.   -She will not be considered for work-up by withdrawal state supports, risk of adrenal crisis.  -She is advised to go to ER if she cannot keep her medications down due to intractable vomiting. - She will be evaluated for the need for mineralocorticoid replacement which may help to stabilize her blood pressure. She is currently on medodrine 5 mg by mouth twice a day. - I had a long discussion with her regarding steroids sick day rules, risk of weight gain/diabetes and the need for periodic screening with A1c.  -Her last A1c was 5% indicating absence of prediabetes/diabetes.    2. Hypothyroidism:  -Her recent thyroid function tests are consistent with appropriate replacement.  She is advised to continue  levothyroxine 75 mcg p.o. every morning.    - We discussed about the correct intake of her thyroid hormone, on empty stomach at fasting, with water, separated by at least 30 minutes from breakfast and other medications,  and separated by more than 4 hours from calcium, iron, multivitamins, acid reflux medications (PPIs). -Patient is made aware of the fact that thyroid hormone replacement is needed for life, dose to be adjusted by periodic monitoring of thyroid function tests.  - I advised her  to maintain close follow up with Kathleen Drown, MD for primary care needs.  - Patient Care Time Today:  20 min, of which >50% was spent in  counseling and the rest  reviewing her  current and  previous labs/studies, previous treatments, her blood glucose readings, and medications' doses and developing a plan for long-term care based on the latest recommendations for standards of care.   Kathleen [name] participated in the discussions, expressed understanding, and voiced agreement with the above plans.  All questions were answered to her satisfaction. she is encouraged to contact clinic should she have any questions or concerns prior to her return visit.   Follow up plan: Return in about 4 months (around 12/02/2019) for Follow up with Pre-visit Labs.   Glade Lloyd, MD [facility] [address] [place], [place] [address] Phone: [phone]  Fax: [phone]     08/03/2019, 1:08 PM  This note was partially dictated with voice recognition software. Similar sounding words can be transcribed inadequately or may not  be corrected upon review.

## 2019-08-04 ENCOUNTER — Ambulatory Visit (HOSPITAL_COMMUNITY): Admission: RE | Admit: 2019-08-04 | Payer: 59 | Source: Ambulatory Visit

## 2019-08-04 ENCOUNTER — Other Ambulatory Visit: Payer: Self-pay

## 2019-08-04 ENCOUNTER — Ambulatory Visit (HOSPITAL_COMMUNITY)
Admission: RE | Admit: 2019-08-04 | Discharge: 2019-08-04 | Disposition: A | Discharge status: Home / Self Care | Payer: 59 | Source: Ambulatory Visit | Attending: Family Medicine | Admitting: Family Medicine

## 2019-08-04 DIAGNOSIS — R928 Other abnormal and inconclusive findings on diagnostic imaging of breast: Secondary | ICD-10-CM | POA: Insufficient documentation

## 2019-08-04 DIAGNOSIS — R922 Inconclusive mammogram: Secondary | ICD-10-CM | POA: Diagnosis not present

## 2019-08-07 ENCOUNTER — Other Ambulatory Visit: Payer: Self-pay | Admitting: "Endocrinology

## 2019-08-07 DIAGNOSIS — E2740 Unspecified adrenocortical insufficiency: Secondary | ICD-10-CM

## 2019-08-07 MED FILL — MIDODRINE HCL 5 MG TABLET: 5 | 30 days supply | Qty: 60 | Fill #3

## 2019-08-07 MED FILL — PANTOPRAZOLE SOD DR 40 MG T: 40 | 90 days supply | Qty: 90 | Fill #3

## 2019-08-08 MED FILL — HYDROCORTISONE 10 MG TABS: 10 | 25 days supply | Qty: 75 | Fill #0

## 2019-08-11 ENCOUNTER — Other Ambulatory Visit: Payer: Self-pay | Admitting: Family Medicine

## 2019-08-11 MED FILL — ONDANSETRON ODT 8 MG TABLET: 8 | 4 days supply | Qty: 12 | Fill #0

## 2019-08-11 MED FILL — oxyCODONE HCL 10 MG TABS: 10 | 30 days supply | Qty: 180 | Fill #0

## 2019-08-11 NOTE — Telephone Encounter (Signed)
Last seen for nausea 07/08/19

## 2019-08-17 ENCOUNTER — Telehealth: Payer: Self-pay | Admitting: Family Medicine

## 2019-08-17 NOTE — Telephone Encounter (Signed)
Pt would like to have a script for catheters. She used [facility] in the past but would rather use someone else and would like to know if Dr. Nicki Reaper has a recommendation.

## 2019-08-17 NOTE — Telephone Encounter (Signed)
I do not have any recommendations but please go ahead and give her a prescription because of atonic bladder

## 2019-08-17 NOTE — Telephone Encounter (Signed)
Called and discussed with pt. She states she will look online for a supplier and call us back to let us know where to send the rx. Will write it after she sends [place] the information.

## 2019-08-19 DIAGNOSIS — J849 Interstitial pulmonary disease, unspecified: Secondary | ICD-10-CM | POA: Diagnosis not present

## 2019-08-19 DIAGNOSIS — M6281 Muscle weakness (generalized): Secondary | ICD-10-CM | POA: Diagnosis not present

## 2019-08-19 DIAGNOSIS — D860 Sarcoidosis of lung: Secondary | ICD-10-CM | POA: Diagnosis not present

## 2019-08-19 DIAGNOSIS — J9601 Acute respiratory failure with hypoxia: Secondary | ICD-10-CM | POA: Diagnosis not present

## 2019-08-27 NOTE — Telephone Encounter (Signed)
Patient states she thinks she will go with [facility](she has used them in the past) and will call us back with fax # (patient currently not home)

## 2019-09-09 ENCOUNTER — Other Ambulatory Visit: Payer: Self-pay | Admitting: "Endocrinology

## 2019-09-09 DIAGNOSIS — E2740 Unspecified adrenocortical insufficiency: Secondary | ICD-10-CM

## 2019-09-09 MED FILL — HYDROCORTISONE 10 MG TABS: 10 | 25 days supply | Qty: 75 | Fill #0

## 2019-09-09 MED FILL — PROMETHAZINE 25 MG TABLET: 25 | 8 days supply | Qty: 25 | Fill #1

## 2019-09-09 MED FILL — ONDANSETRON ODT 8 MG TABLET: 8 | 4 days supply | Qty: 12 | Fill #1

## 2019-09-09 MED FILL — MIDODRINE HCL 5 MG TABLET: 5 | 30 days supply | Qty: 60 | Fill #4

## 2019-09-10 ENCOUNTER — Telehealth: Payer: Self-pay | Admitting: Family Medicine

## 2019-09-10 MED FILL — oxyCODONE HCL 10 MG TABS: 10 | 30 days supply | Qty: 180 | Fill #0

## 2019-09-10 NOTE — Telephone Encounter (Signed)
May have 90-day with 1 additional refill

## 2019-09-10 NOTE — Telephone Encounter (Signed)
Fax from [facility] requesting refill on Amitiza 24 mcg capsules. Pt last seen 07/27/2019 for adrenal insufficiency. Please advise. Thank you

## 2019-09-11 ENCOUNTER — Telehealth: Payer: Self-pay | Admitting: Family Medicine

## 2019-09-11 MED ORDER — LUBIPROSTONE 24 MCG PO CAPS: 24.0000 ug | ORAL_CAPSULE | Freq: Two times a day (BID) | ORAL | 1 refills | Status: DC

## 2019-09-11 NOTE — Telephone Encounter (Signed)
Pt would like Dr. Nicki Reaper to contact her lung specialist in [facility] (Dr. Farley Ly) to discuss pt's need to get the Covid vaccine due to her serious lung condition  (pt has phone visit scheduled with Dr. Nicki Reaper 09/15/2019)

## 2019-09-11 NOTE — Addendum Note (Signed)
Addended by: Vicente Males on: 09/11/2019 02:28 PM   Modules accepted: Orders

## 2019-09-11 NOTE — Telephone Encounter (Signed)
So unfortunately there is no ability to get her the vaccine sooner we have to follow the state guidelines  #2 I recommend for the patient to call her lung specialist and ask their staff if she can get it with her underlying condition and the medication she is on  #3 in my opinion the vaccine would be a good idea for her but we need her specialist approval

## 2019-09-11 NOTE — Telephone Encounter (Signed)
Patient was wanting you to call her lung specialist so yall could discuss her condition and if she can get the shot and if so when and how

## 2019-09-11 NOTE — Telephone Encounter (Signed)
So therefore Nurses Next week reach out to her lung specialist Dr. Farley Ly Please let me talk with him or his clinical assistant regarding whether or not [place] can get Covid vaccine

## 2019-09-11 NOTE — Telephone Encounter (Signed)
Patient states she saw lung doctor in December and won't see him again till March and she didn't discuss it with him but she knows with her lungs as bad as they are she would not do well if she got Covid and is trying to figure out a way to get the vaccine now even though she only 58. Patient said she needs to have someone have it arranged for her because she cant schedule herself since she does not meet the age criteria for now and also needs to know if it is safe for her to get it with her health problems and if it is how she can get it now

## 2019-09-11 NOTE — Telephone Encounter (Signed)
So please talk with the patient Before I tried to call the lung doctor I would like to know more from the patient Find out from the patient what did her lung doctor say regarding whether or not she can get the Covid vaccine Was there any particular questions she has asked him?  When was her last visit with him?  Did she bring this issue up with him? (Currently right now they are doing the vaccine for individual 65 and up)

## 2019-09-11 NOTE — Telephone Encounter (Signed)
Refills sent to pharmacy. 

## 2019-09-14 NOTE — Telephone Encounter (Signed)
Patient stated she will call and have the specialist contact us or send [place] a message with his recommendations for the Covid Vaccination.

## 2019-09-14 NOTE — Telephone Encounter (Signed)
Patient wants to go with [facility] - form received

## 2019-09-14 NOTE — Telephone Encounter (Signed)
Please let the patient know that we tried we called-specifically you called-and was on hold for over 35 minutes then the phone hung up on you At this point it will be the patient's responsibility to check with her specialist

## 2019-09-14 NOTE — Telephone Encounter (Signed)
[facility] needs progress notes on patient for coverage of catheter supplies/ Notes must include frequency of use and diagnosis, state permanence of condition and continued plan of care

## 2019-09-15 ENCOUNTER — Telehealth: Payer: Self-pay | Admitting: *Deleted

## 2019-09-15 ENCOUNTER — Ambulatory Visit (INDEPENDENT_AMBULATORY_CARE_PROVIDER_SITE_OTHER): Payer: 59 | Admitting: Family Medicine

## 2019-09-15 ENCOUNTER — Other Ambulatory Visit: Payer: Self-pay

## 2019-09-15 ENCOUNTER — Other Ambulatory Visit: Payer: Self-pay | Admitting: *Deleted

## 2019-09-15 DIAGNOSIS — G894 Chronic pain syndrome: Secondary | ICD-10-CM

## 2019-09-15 DIAGNOSIS — J849 Interstitial pulmonary disease, unspecified: Secondary | ICD-10-CM | POA: Diagnosis not present

## 2019-09-15 DIAGNOSIS — M87052 Idiopathic aseptic necrosis of left femur: Secondary | ICD-10-CM | POA: Diagnosis not present

## 2019-09-15 DIAGNOSIS — K219 Gastro-esophageal reflux disease without esophagitis: Secondary | ICD-10-CM

## 2019-09-15 DIAGNOSIS — E271 Primary adrenocortical insufficiency: Secondary | ICD-10-CM | POA: Diagnosis not present

## 2019-09-15 DIAGNOSIS — K5909 Other constipation: Secondary | ICD-10-CM | POA: Diagnosis not present

## 2019-09-15 DIAGNOSIS — E038 Other specified hypothyroidism: Secondary | ICD-10-CM | POA: Diagnosis not present

## 2019-09-15 DIAGNOSIS — E559 Vitamin D deficiency, unspecified: Secondary | ICD-10-CM

## 2019-09-15 DIAGNOSIS — R339 Retention of urine, unspecified: Secondary | ICD-10-CM | POA: Diagnosis not present

## 2019-09-15 DIAGNOSIS — E785 Hyperlipidemia, unspecified: Secondary | ICD-10-CM

## 2019-09-15 DIAGNOSIS — E876 Hypokalemia: Secondary | ICD-10-CM

## 2019-09-15 MED ORDER — OXYCODONE HCL 10 MG PO TABS: ORAL_TABLET | ORAL | 0 refills | Status: DC

## 2019-09-15 MED ORDER — CEFPROZIL 500 MG PO TABS: 500.0000 mg | ORAL_TABLET | Freq: Two times a day (BID) | ORAL | 0 refills | Status: DC

## 2019-09-15 MED ORDER — EPINEPHRINE 0.3 MG/0.3ML IJ SOAJ: 0.3000 mg | INTRAMUSCULAR | 0 refills | Status: DC | PRN

## 2019-09-15 MED ORDER — LINACLOTIDE 145 MCG PO CAPS: 145.0000 ug | ORAL_CAPSULE | Freq: Every day | ORAL | 1 refills | Status: DC

## 2019-09-15 MED FILL — EPINEPHRINE 0.3 MG AUTO-INJ: 0.3 | 30 days supply | Qty: 2 | Fill #0

## 2019-09-15 MED FILL — LINZESS 145 MCG CAPSULE: 145 | 90 days supply | Qty: 90 | Fill #0

## 2019-09-15 MED FILL — CEFPROZIL 500 MG TABLET: 500 | 5 days supply | Qty: 10 | Fill #0

## 2019-09-15 NOTE — Telephone Encounter (Signed)
All of this will be addressed in her office visit dictation for today thank you

## 2019-09-15 NOTE — Progress Notes (Signed)
Subjective:    Patient ID: Kathleen [name], female    DOB: 04/06/1961, 59 y.o.   MRN: [id number]  HPI This patient was seen today for chronic pain Patient states she has constant hip pain and back pain.  She is hoping Monday to get surgery but currently is not a good idea.  Combination of her underlying lung disease and pandemic is preventing this She states the pain medicine does allow her to function better she denies any setbacks or problems with it. The medication list was reviewed and updated.   -Compliance with medication: takes 5 -6 a day  - Number patient states they take daily: 5 -6  -when was the last dose patient took? today  The patient was advised the importance of maintaining medication and not using illegal substances with these.  Here for refills and follow up  The patient was educated that we can provide 3 monthly scripts for their medication, it is their responsibility to follow the instructions.  Side effects or complications from medications: none  Patient is aware that pain medications are meant to minimize the severity of the pain to allow their pain levels to improve to allow for better function. They are aware of that pain medications cannot totally remove their pain.  Due for UDT ( at least once per year) : due today but doing a virtual visit so unable to collect urine sample.   Would like to know if she needs to continue carrying epi pen. States she was stung by a bee and did not have epi pen with her and she did not have a reaction.   Pt states she needs something in the place of amitiza because it cost $300.  She has chronic constipation issues and Amitiza is just too expensive so we will look into other options according to the pharmacist Linzess is cheaper so we will go ahead with that medicine  This patient has atonic bladder and has to use a catheterization at least once every single day in order to help so we will help her get her catheters  burning  with urination started 2 days ago.  She does relate some dysuria urinary frequency so we will send her in an antibiotic.  Discuss covid vaccine and her lung condition.  She is very scared about getting Covid we did discuss how to help prevent this we also discussed the vaccine in my opinion that she would be a good candidate for the vaccine but she needs to talk with her pulmonologist to make sure that her current underlying medications are suitable for her to get the vaccine  Virtual Visit via Telephone Note  I connected with [name] on 09/15/19 at  9:00 AM EST by telephone and verified that I am speaking with the correct person using two identifiers.  Location: Patient: home Provider: office   I discussed the limitations, risks, security and privacy concerns of performing an evaluation and management service by telephone and the availability of in person appointments. I also discussed with the patient that there may be a patient responsible charge related to this service. The patient expressed understanding and agreed to proceed.   History of Present Illness:    Observations/Objective:   Assessment and Plan:   Follow Up Instructions:    I discussed the assessment and treatment plan with the patient. The patient was provided an opportunity to ask questions and all were answered. The patient agreed with the plan and demonstrated an understanding  of the instructions.   The patient was advised to call back or seek an in-person evaluation if the symptoms worsen or if the condition fails to improve as anticipated.  I provided 30 minutes of non-face-to-face time during this encounter.        Review of Systems  Constitutional: Negative for activity change, appetite change and fatigue.  HENT: Negative for congestion and rhinorrhea.   Respiratory: Negative for cough and shortness of breath.   Cardiovascular: Negative for chest pain and leg swelling.  Gastrointestinal:  Negative for abdominal pain and diarrhea.  Endocrine: Negative for polydipsia and polyphagia.  Musculoskeletal: Positive for arthralgias and gait problem. Negative for back pain.  Skin: Negative for color change.  Neurological: Negative for dizziness and weakness.  Psychiatric/Behavioral: Negative for behavioral problems and confusion.       Objective:   Physical Exam  Today's visit was via telephone Physical exam was not possible for this visit       Assessment & Plan:  1. Urinary retention She uses a catheter at least once a day she will have this condition probably lifelong due to her underlying health condition  2. Avascular necrosis of bone of hip, left ([place]) Eventually should need surgery but for now pain medicine The patient was seen in followup for chronic pain. A review over at their current pain status was discussed. Drug registry was checked. Prescriptions were given. Discussion was held regarding the importance of compliance with medication as well as pain medication contract.  Time for questions regarding pain management plan occurred. Importance of regular followup visits was discussed. Patient was informed that medication may cause drowsiness and should not be combined  with other medications/alcohol or street drugs. Patient was cautioned that medication could cause drowsiness. If the patient feels medication is causing altered alertness then do not drive or operate dangerous equipment.  She states medicine does do a decent job allowing her to stay functional  3. Adrenal insufficiency (Addison's disease) ([place]) She follows with Dr. Dorris Fetch regarding this takes her medicine  4. Interstitial lung disease ([place]) She follows with Dr. Farley [facility] at [facility] takes her medicine  5. Gastroesophageal reflux disease, unspecified whether esophagitis present Under good control with current medication she watches her diet closely  6. Hyperlipidemia, unspecified hyperlipidemia type She  will need to have lab work later this spring continue to have healthy eating to minimize risk of heart disease  7. Other constipation Current medication too expensive we will use Linzess this will be less expensive for her to do  8. Chronic pain syndrome See pain management as per above continue pain medicine.  I do not want to go up on the dose currently.  9. Other specified hypothyroidism Check thyroid function on her next lab work continue current approach - TSH - T4, free  10. Vitamin D deficiency Check vitamin D level on next lab work - VITAMIN D 25 Hydroxy (Vit-D Deficiency, Fractures)  11. Hypokalemia Check potassium on next blood work - Basic metabolic panel 30 minutes spent with patient greater than half in discussion of multiple health issues.

## 2019-09-15 NOTE — Telephone Encounter (Signed)
amitiza is $300 and pt would like changed. I called her pharm [facility] and was told that Linzess would be a $35 co pay for her and she could also use a coupon to make it cheaper.

## 2019-09-15 NOTE — Telephone Encounter (Signed)
The medication was changed and was sent in thank you

## 2019-09-16 NOTE — Telephone Encounter (Signed)
Form and office notes were faxed yesterday 09/15/19

## 2019-09-19 DIAGNOSIS — D860 Sarcoidosis of lung: Secondary | ICD-10-CM | POA: Diagnosis not present

## 2019-09-19 DIAGNOSIS — J849 Interstitial pulmonary disease, unspecified: Secondary | ICD-10-CM | POA: Diagnosis not present

## 2019-09-19 DIAGNOSIS — M6281 Muscle weakness (generalized): Secondary | ICD-10-CM | POA: Diagnosis not present

## 2019-09-19 DIAGNOSIS — J9601 Acute respiratory failure with hypoxia: Secondary | ICD-10-CM | POA: Diagnosis not present

## 2019-09-29 DIAGNOSIS — R339 Retention of urine, unspecified: Secondary | ICD-10-CM | POA: Diagnosis not present

## 2019-09-30 ENCOUNTER — Other Ambulatory Visit: Payer: Self-pay | Admitting: "Endocrinology

## 2019-09-30 DIAGNOSIS — E2740 Unspecified adrenocortical insufficiency: Secondary | ICD-10-CM

## 2019-09-30 MED FILL — HYDROCORTISONE 10 MG TABS: 10 | 25 days supply | Qty: 75 | Fill #0

## 2019-10-01 ENCOUNTER — Telehealth: Payer: Self-pay | Admitting: *Deleted

## 2019-10-01 NOTE — Telephone Encounter (Signed)
Pt states she got the wrong catheters. She got the in and out and she needs the ones with the bag. She called the company and they are going to let her send those back and they are suppose to send an order for the ones she needs. Company is [employer]. I told her I would look out for fax and give to dr scott once we received it.

## 2019-10-05 NOTE — Telephone Encounter (Signed)
Form was completed by Dr Nicki Reaper and faxed back to company.

## 2019-10-06 DIAGNOSIS — M25559 Pain in unspecified hip: Secondary | ICD-10-CM | POA: Diagnosis not present

## 2019-10-06 DIAGNOSIS — F419 Anxiety disorder, unspecified: Secondary | ICD-10-CM | POA: Diagnosis not present

## 2019-10-06 DIAGNOSIS — J45909 Unspecified asthma, uncomplicated: Secondary | ICD-10-CM | POA: Diagnosis not present

## 2019-10-06 DIAGNOSIS — K219 Gastro-esophageal reflux disease without esophagitis: Secondary | ICD-10-CM | POA: Diagnosis not present

## 2019-10-06 DIAGNOSIS — Z9981 Dependence on supplemental oxygen: Secondary | ICD-10-CM | POA: Diagnosis not present

## 2019-10-06 DIAGNOSIS — J849 Interstitial pulmonary disease, unspecified: Secondary | ICD-10-CM | POA: Diagnosis not present

## 2019-10-06 DIAGNOSIS — R079 Chest pain, unspecified: Secondary | ICD-10-CM | POA: Diagnosis not present

## 2019-10-06 DIAGNOSIS — G8929 Other chronic pain: Secondary | ICD-10-CM | POA: Diagnosis not present

## 2019-10-06 DIAGNOSIS — R0989 Other specified symptoms and signs involving the circulatory and respiratory systems: Secondary | ICD-10-CM | POA: Diagnosis not present

## 2019-10-06 DIAGNOSIS — R110 Nausea: Secondary | ICD-10-CM | POA: Diagnosis not present

## 2019-10-06 DIAGNOSIS — D860 Sarcoidosis of lung: Secondary | ICD-10-CM | POA: Diagnosis not present

## 2019-10-06 DIAGNOSIS — R0602 Shortness of breath: Secondary | ICD-10-CM | POA: Diagnosis not present

## 2019-10-06 DIAGNOSIS — I951 Orthostatic hypotension: Secondary | ICD-10-CM | POA: Diagnosis not present

## 2019-10-06 DIAGNOSIS — D869 Sarcoidosis, unspecified: Secondary | ICD-10-CM | POA: Diagnosis not present

## 2019-10-06 DIAGNOSIS — F329 Major depressive disorder, single episode, unspecified: Secondary | ICD-10-CM | POA: Diagnosis not present

## 2019-10-09 ENCOUNTER — Other Ambulatory Visit: Payer: Self-pay | Admitting: Family Medicine

## 2019-10-09 MED FILL — oxyCODONE HCL 10 MG TABS: 10 | 30 days supply | Qty: 180 | Fill #0

## 2019-10-09 MED FILL — HYDROCORTISONE 10 MG TABS: 10 | 25 days supply | Qty: 75 | Fill #0

## 2019-10-16 DIAGNOSIS — R339 Retention of urine, unspecified: Secondary | ICD-10-CM | POA: Diagnosis not present

## 2019-10-20 DIAGNOSIS — D860 Sarcoidosis of lung: Secondary | ICD-10-CM | POA: Diagnosis not present

## 2019-10-20 DIAGNOSIS — M6281 Muscle weakness (generalized): Secondary | ICD-10-CM | POA: Diagnosis not present

## 2019-10-20 DIAGNOSIS — J9601 Acute respiratory failure with hypoxia: Secondary | ICD-10-CM | POA: Diagnosis not present

## 2019-10-20 DIAGNOSIS — J849 Interstitial pulmonary disease, unspecified: Secondary | ICD-10-CM | POA: Diagnosis not present

## 2019-11-05 ENCOUNTER — Other Ambulatory Visit: Payer: Self-pay | Admitting: Family Medicine

## 2019-11-05 MED FILL — PANTOPRAZOLE SOD DR 40 MG T: 40 | 90 days supply | Qty: 90 | Fill #0

## 2019-11-05 NOTE — Telephone Encounter (Signed)
Last seen 09/15/19 for pain management

## 2019-11-05 NOTE — Telephone Encounter (Signed)
Please verify patient is taking this medication on a regular basis before filling if she is she may have 4 months worth of meds

## 2019-11-06 MED FILL — MIDODRINE HCL 5 MG TABS: 5 | 30 days supply | Qty: 60 | Fill #0

## 2019-11-06 MED FILL — oxyCODONE HCL 10 MG TABS: 10 | 30 days supply | Qty: 180 | Fill #0

## 2019-11-06 NOTE — Telephone Encounter (Signed)
Pt states she takes it once a day not twice a day.   Pt states the label of hydrocortisone said stopped prednisone. But her lung specialist told her not to stop prednisone. I asked pt to call specialist who prescribed but she states she wants to know what dr Nicki Reaper thinks because he knows her situation.   Pt states she discuss taking melatonin for sleep with dr Nicki Reaper and it is not helping. She states she needs some sleep at night. Wants to know if she can take tylenol pm or something else. She thinks the bp med and steroids keeps her up. States her body is wore out.   She wants a call back today.

## 2019-11-06 NOTE — Telephone Encounter (Signed)
She may have the prescription +5 refills keep the label as twice a day but she can take it is once a day  She needs to continue the prednisone and follow her specialist direction  Melatonin or Tylenol PM okay  Follow-up here by later in the spring for her regular pain management and regular visits

## 2019-11-06 NOTE — Telephone Encounter (Signed)
Left message to return call 

## 2019-11-06 NOTE — Telephone Encounter (Signed)
Called and discussed with pt. She verbalized understanding of all and refills sent to pharm.

## 2019-11-12 DIAGNOSIS — Z23 Encounter for immunization: Secondary | ICD-10-CM | POA: Diagnosis not present

## 2019-11-16 DIAGNOSIS — H0261 Xanthelasma of right upper eyelid: Secondary | ICD-10-CM | POA: Diagnosis not present

## 2019-11-16 DIAGNOSIS — H0264 Xanthelasma of left upper eyelid: Secondary | ICD-10-CM | POA: Diagnosis not present

## 2019-11-16 DIAGNOSIS — H2513 Age-related nuclear cataract, bilateral: Secondary | ICD-10-CM | POA: Diagnosis not present

## 2019-11-17 ENCOUNTER — Ambulatory Visit (INDEPENDENT_AMBULATORY_CARE_PROVIDER_SITE_OTHER): Payer: 59 | Admitting: Family Medicine

## 2019-11-17 ENCOUNTER — Other Ambulatory Visit: Payer: Self-pay

## 2019-11-17 DIAGNOSIS — M6281 Muscle weakness (generalized): Secondary | ICD-10-CM | POA: Diagnosis not present

## 2019-11-17 DIAGNOSIS — J849 Interstitial pulmonary disease, unspecified: Secondary | ICD-10-CM

## 2019-11-17 DIAGNOSIS — G894 Chronic pain syndrome: Secondary | ICD-10-CM

## 2019-11-17 DIAGNOSIS — E559 Vitamin D deficiency, unspecified: Secondary | ICD-10-CM

## 2019-11-17 DIAGNOSIS — J9601 Acute respiratory failure with hypoxia: Secondary | ICD-10-CM | POA: Diagnosis not present

## 2019-11-17 DIAGNOSIS — E2740 Unspecified adrenocortical insufficiency: Secondary | ICD-10-CM | POA: Diagnosis not present

## 2019-11-17 DIAGNOSIS — M87052 Idiopathic aseptic necrosis of left femur: Secondary | ICD-10-CM

## 2019-11-17 DIAGNOSIS — Z79899 Other long term (current) drug therapy: Secondary | ICD-10-CM

## 2019-11-17 DIAGNOSIS — N3000 Acute cystitis without hematuria: Secondary | ICD-10-CM

## 2019-11-17 DIAGNOSIS — E785 Hyperlipidemia, unspecified: Secondary | ICD-10-CM | POA: Diagnosis not present

## 2019-11-17 DIAGNOSIS — D860 Sarcoidosis of lung: Secondary | ICD-10-CM | POA: Diagnosis not present

## 2019-11-17 MED ORDER — CEPHALEXIN 500 MG PO CAPS: 500.0000 mg | ORAL_CAPSULE | Freq: Three times a day (TID) | ORAL | 0 refills | Status: DC

## 2019-11-17 MED ORDER — OXYCODONE HCL 10 MG PO TABS: ORAL_TABLET | ORAL | 0 refills | Status: DC

## 2019-11-17 MED ORDER — ROSUVASTATIN CALCIUM 5 MG PO TABS: ORAL_TABLET | ORAL | 1 refills | Status: DC

## 2019-11-17 MED FILL — ROSUVASTATIN CALCIUM 5 MG T: 5 | 90 days supply | Qty: 90 | Fill #0

## 2019-11-17 MED FILL — CEPHALEXIN 500 MG CAPSULE: 500 | 5 days supply | Qty: 15 | Fill #0

## 2019-11-17 NOTE — Progress Notes (Signed)
Subjective:    Patient ID: AYIRA MICHEALS, female    DOB: February 24, 1961, 59 y.o.   MRN: [id number]  Hyperlipidemia This is a chronic problem. Associated symptoms include shortness of breath. Pertinent negatives include no chest pain. Treatments tried: not taking any meds.  pt states she went to eye doctor yesterday and was told she needed to check cholesterol because she had xelanthesma on her eye lid. Adrenal insufficiency ([facility])  Avascular necrosis of bone of hip, left ([facility])  Interstitial lung disease ([facility])  Chronic pain syndrome  Vitamin D deficiency - Plan: VITAMIN D 25 Hydroxy (Vit-D Deficiency, Fractures)  Acute cystitis without hematuria  Hyperlipidemia, unspecified hyperlipidemia type - Plan: Lipid panel  High risk medication use - Plan: Hepatic function panel   Patient with UTI symptoms burning urinary symptoms does her in and out catheters on a frequent basis therefore she is possible probable having UTI Also left hip pain severe requiring narcotics denies abusing it needs refills Also cholesterol deposits around her eye increasing risk of heart disease elevated LDL on blood work therefore go ahead and start with Crestor on a daily basis side effects discussed Finished cefzil in january and started having burning with urination again yesterday.   Virtual Visit via Telephone Note  I connected with [name] on 11/17/19 at 10:30 AM EDT by telephone and verified that I am speaking with the correct person using two identifiers.  Location: Patient: home Provider: office   I discussed the limitations, risks, security and privacy concerns of performing an evaluation and management service by telephone and the availability of in person appointments. I also discussed with the patient that there may be a patient responsible charge related to this service. The patient expressed understanding and agreed to proceed.   History of Present Illness:     Observations/Objective:   Assessment and Plan:   Follow Up Instructions:    I discussed the assessment and treatment plan with the patient. The patient was provided an opportunity to ask questions and all were answered. The patient agreed with the plan and demonstrated an understanding of the instructions.   The patient was advised to call back or seek an in-person evaluation if the symptoms worsen or if the condition fails to improve as anticipated.  I provided 30 minutes of non-face-to-face time during this encounter.     Review of Systems  Constitutional: Positive for fatigue. Negative for activity change and appetite change.  HENT: Negative for congestion and rhinorrhea.   Respiratory: Positive for shortness of breath. Negative for cough.   Cardiovascular: Negative for chest pain and leg swelling.  Gastrointestinal: Negative for abdominal pain and diarrhea.  Endocrine: Negative for polydipsia and polyphagia.  Musculoskeletal: Positive for arthralgias and back pain.  Skin: Negative for color change.  Neurological: Negative for dizziness and weakness.  Psychiatric/Behavioral: Negative for behavioral problems and confusion.       Objective:   Physical Exam  Exam unable to do physical      Assessment & Plan:  1. Adrenal insufficiency ([place]) She is followed by Dr. Dorris Fetch she takes her medication on a regular basis states she has been doing well  2. Avascular necrosis of bone of hip, left ([place]) She has severe pain related to this takes oxycodone up to 6 times per day helps to some degree with pain allows her to function better but she is debilitated from this  3. Interstitial lung disease ([place]) She does see her specialist twice a year she  has had her Covid vaccine #1 she states her breathing is hanging in there but she does have a history of this disease and is disabled  4. Chronic pain syndrome The patient was seen in followup for chronic pain. A review over at their  current pain status was discussed. Drug registry was checked. Prescriptions were given.  Regular follow-up recommended. Discussion was held regarding the importance of compliance with medication as well as pain medication contract.  Patient was informed that medication may cause drowsiness and should not be combined  with other medications/alcohol or street drugs. If the patient feels medication is causing altered alertness then do not drive or operate dangerous equipment.  3 scripts were given today and it does allow her to function better with that outcome she would be very debilitated she denies feeling drowsy with the medicine denies abusing the medicine  5. Vitamin D deficiency We will check vitamin D level previous labs reviewed - VITAMIN D 25 Hydroxy (Vit-D Deficiency, Fractures)  6. Acute cystitis without hematuria Antibiotics prescribed probably related to her frequent catheterization because of urinary retention  7. Hyperlipidemia, unspecified hyperlipidemia type Patient now having cholesterol deposits around her eyes so therefore go ahead with Crestor 5 mg daily check lab work in 8 weeks this would be wise to help prevent further deposits and prevent coronary artery disease - Lipid panel  8. High risk medication use Check lab work 8 weeks - Hepatic function panel

## 2019-11-20 ENCOUNTER — Other Ambulatory Visit: Payer: Self-pay | Admitting: "Endocrinology

## 2019-11-20 DIAGNOSIS — E039 Hypothyroidism, unspecified: Secondary | ICD-10-CM | POA: Diagnosis not present

## 2019-11-20 DIAGNOSIS — E2740 Unspecified adrenocortical insufficiency: Secondary | ICD-10-CM | POA: Diagnosis not present

## 2019-11-21 LAB — COMPREHENSIVE METABOLIC PANEL
ALT: 12 IU/L (ref 0–32)
AST: 14 IU/L (ref 0–40)
Albumin/Globulin Ratio: 1.9 (ref 1.2–2.2)
Albumin: 4.1 g/dL (ref 3.8–4.9)
Alkaline Phosphatase: 86 IU/L (ref 39–117)
BUN/Creatinine Ratio: 13 (ref 9–23)
BUN: 11 mg/dL (ref 6–24)
Bilirubin Total: 0.3 mg/dL (ref 0.0–1.2)
CO2: 25 mmol/L (ref 20–29)
Calcium: 9.5 mg/dL (ref 8.7–10.2)
Chloride: 103 mmol/L (ref 96–106)
Creatinine, Ser: 0.85 mg/dL (ref 0.57–1.00)
GFR calc Af Amer: 88 mL/min/{1.73_m2} (ref >59)
GFR calc non Af Amer: 76 mL/min/{1.73_m2} (ref >59)
Globulin, Total: 2.2 g/dL (ref 1.5–4.5)
Glucose: 93 mg/dL (ref 65–99)
Potassium: 4.8 mmol/L (ref 3.5–5.2)
Sodium: 140 mmol/L (ref 134–144)
Total Protein: 6.3 g/dL (ref 6.0–8.5)

## 2019-11-21 LAB — T4, FREE: Free T4: 1.25 ng/dL (ref 0.82–1.77)

## 2019-11-21 LAB — SPECIMEN STATUS REPORT

## 2019-11-21 LAB — TSH: TSH: 0.689 u[IU]/mL (ref 0.450–4.500)

## 2019-11-23 ENCOUNTER — Ambulatory Visit: Payer: 59 | Admitting: Family Medicine

## 2019-11-24 ENCOUNTER — Other Ambulatory Visit: Payer: Self-pay | Admitting: "Endocrinology

## 2019-11-24 DIAGNOSIS — E2740 Unspecified adrenocortical insufficiency: Secondary | ICD-10-CM

## 2019-11-24 MED FILL — PROMETHAZINE 25 MG TABLET: 25 | 8 days supply | Qty: 25 | Fill #2

## 2019-11-24 MED FILL — ONDANSETRON ODT 8 MG TABLET: 8 | 4 days supply | Qty: 12 | Fill #2

## 2019-11-25 ENCOUNTER — Other Ambulatory Visit: Payer: Self-pay

## 2019-11-25 DIAGNOSIS — E2740 Unspecified adrenocortical insufficiency: Secondary | ICD-10-CM

## 2019-11-25 MED ORDER — HYDROCORTISONE 10 MG PO TABS: ORAL_TABLET | ORAL | 0 refills | Status: DC

## 2019-11-25 MED FILL — HYDROCORTISONE 10 MG TABLET: 10 | 90 days supply | Qty: 180 | Fill #0

## 2019-12-02 ENCOUNTER — Ambulatory Visit: Payer: 59 | Admitting: "Endocrinology

## 2019-12-03 ENCOUNTER — Encounter: Payer: Self-pay | Admitting: Family Medicine

## 2019-12-03 ENCOUNTER — Other Ambulatory Visit: Payer: Self-pay | Admitting: Family Medicine

## 2019-12-03 ENCOUNTER — Ambulatory Visit (INDEPENDENT_AMBULATORY_CARE_PROVIDER_SITE_OTHER): Payer: 59 | Admitting: Family Medicine

## 2019-12-03 ENCOUNTER — Other Ambulatory Visit: Payer: Self-pay

## 2019-12-03 VITALS — BP 138/82 | Temp 97.0°F | Wt 218.2 lb

## 2019-12-03 DIAGNOSIS — H60501 Unspecified acute noninfective otitis externa, right ear: Secondary | ICD-10-CM | POA: Diagnosis not present

## 2019-12-03 MED ORDER — CIPROFLOXACIN-DEXAMETHASONE 0.3-0.1 % OT SUSP: 4.0000 [drp] | Freq: Two times a day (BID) | OTIC | 0 refills | Status: DC

## 2019-12-03 MED ORDER — CIPROFLOXACIN HCL 500 MG PO TABS: 500.0000 mg | ORAL_TABLET | Freq: Two times a day (BID) | ORAL | 0 refills | Status: DC

## 2019-12-03 NOTE — Progress Notes (Signed)
   Subjective:    Patient ID: Kathleen [name], female    DOB: 08/26/1961, 59 y.o.   MRN: [id number]  HPI Pt having right ear pain. Going on for about 3 days. Pt states it feels as if her ear is closed up. Tender to touch. Tried ear drops from [employer]. Pt states she is unable to sleep on right side.   Pt did take about 3 Keflex left over from UTI and took those to try and help ear. Pt states she has tried ice and a heating pad to ear with no relief.  Patient very tearful because her brother is in the hospital with severe neurofibromatosis and paralysis he is adopted  Review of Systems     Objective:   Physical Exam  Lungs clear respiratory rate normal heart regular no murmurs right otitis externa noted throat appears normal neck no masses      Assessment & Plan:  Right otitis externa Antibiotic eardrops Antibiotics also Patient is on prednisone which could complicate matters If progressive troubles over the next several days to notify us No need for x-rays CT scan sed rate CBC or hospitalization at this point Follow-up if progressive troubles Warning signs were discussed

## 2019-12-04 ENCOUNTER — Encounter: Payer: Self-pay | Admitting: "Endocrinology

## 2019-12-04 ENCOUNTER — Ambulatory Visit (INDEPENDENT_AMBULATORY_CARE_PROVIDER_SITE_OTHER): Payer: 59 | Admitting: "Endocrinology

## 2019-12-04 VITALS — BP 122/80 | HR 93 | Ht 70.0 in | Wt 218.2 lb

## 2019-12-04 DIAGNOSIS — E2740 Unspecified adrenocortical insufficiency: Secondary | ICD-10-CM | POA: Diagnosis not present

## 2019-12-04 DIAGNOSIS — E039 Hypothyroidism, unspecified: Secondary | ICD-10-CM | POA: Diagnosis not present

## 2019-12-04 MED ORDER — LEVOTHYROXINE SODIUM 75 MCG PO TABS: ORAL_TABLET | ORAL | 1 refills | Status: DC

## 2019-12-04 MED FILL — LEVOTHYROXINE 75 MCG TABLET: 75 | 90 days supply | Qty: 84 | Fill #0

## 2019-12-04 MED FILL — oxyCODONE HCL 10 MG TABS: 10 | 30 days supply | Qty: 180 | Fill #0

## 2019-12-04 NOTE — Progress Notes (Signed)
12/04/2019, 1:33 PM            Endocrinology follow-up note    Subjective:    Patient ID: Kathleen [name], female    DOB: [birth year], PCP [name], MD   Past Medical History:  Diagnosis Date  . Adrenal insufficiency ([place])   . Anemia   . Avascular bone necrosis ([place])   . Breast fibrocystic disorder   . Chronic pain   . Fibromyalgia   . Gastroesophageal reflux disease   . Mitral valve prolapse   . Orthostatic hypotension   . Peripheral neuropathy   . Restrictive lung disease    Moderate to severe  . Sarcoidosis    Biopsy proven - [facility]  . SOB (shortness of breath)    chronic   Past Surgical History:  Procedure Laterality Date  . BIOPSY  10/17/2018   Procedure: BIOPSY;  Surgeon: Rogene Houston, MD;  Location: [facility];  Service: Endoscopy;;  duodenum, antrum, gastric body  . BREAST LUMPECTOMY  01/12/2011   right  . ESOPHAGOGASTRODUODENOSCOPY (EGD) WITH PROPOFOL N/A 10/17/2018   Procedure: ESOPHAGOGASTRODUODENOSCOPY (EGD) WITH PROPOFOL;  Surgeon: Rogene Houston, MD;  Location: [facility];  Service: Endoscopy;  Laterality: N/A;  2:25  . TUBAL LIGATION     Social History   Socioeconomic History  . Marital status: Married    Spouse name: Not on file  . Number of children: 1  . Years of education: Not on file  . Highest education level: Not on file  Occupational History  . Occupation: Teacher, early years/pre: [employer]  Tobacco Use  . Smoking status: Never Smoker  . Smokeless tobacco: Never Used  Substance and Sexual Activity  . Alcohol use: No  . Drug use: No  . Sexual activity: Yes  Other Topics Concern  . Not on file  Social History Narrative   Daily caffeine    Social Determinants of Health   Financial Resource Strain:   . Difficulty of Paying Living Expenses:   Food Insecurity:   . Worried About Charity fundraiser in the Last Year:   . Arboriculturist in the Last Year:    Transportation Needs:   . Film/video editor (Medical):   Marland Kitchen Lack of Transportation (Non-Medical):   Physical Activity:   . Days of Exercise per Week:   . Minutes of Exercise per Session:   Stress:   . Feeling of Stress :   Social Connections:   . Frequency of Communication with Friends and Family:   . Frequency of Social Gatherings with Friends and Family:   . Attends Religious Services:   . Active Member of Clubs or Organizations:   . Attends Archivist Meetings:   Marland Kitchen Marital Status:    Family History  Problem Relation Age of Onset  . Cardiomyopathy Mother   . Breast cancer Mother   . Prostate cancer Father   . Neurofibromatosis Brother   . Heart disease Maternal Aunt   . Bipolar disorder Daughter   . Colon cancer Neg Hx    Outpatient Encounter Medications as of 12/04/2019  Medication Sig  . acetaminophen (TYLENOL) 500 MG tablet Take 1,000 mg  by mouth every 8 (eight) hours as needed for headache.  . albuterol (PROVENTIL) (2.5 MG/3ML) 0.083% nebulizer solution Take 3 mLs (2.5 mg total) by nebulization every 2 (two) hours as needed for wheezing or shortness of breath.  . ciprofloxacin (CIPRO) 500 MG tablet Take 1 tablet (500 mg total) by mouth 2 (two) times daily.  . ciprofloxacin-dexamethasone (CIPRODEX) OTIC suspension Place 4 drops into the right ear 2 (two) times daily.  Marland Kitchen EPINEPHrine 0.3 mg/0.3 mL IJ SOAJ injection Inject 0.3 mLs (0.3 mg total) into the muscle as needed for anaphylaxis.  . ferrous sulfate 325 (65 FE) MG EC tablet Take 325 mg by mouth daily with breakfast.  . hydrocortisone (CORTEF) 10 MG tablet TAKE 2 TABLETS BY MOUTH AT 8AM  . levothyroxine (SYNTHROID) 75 MCG tablet TAKE 1 TABLET BY MOUTH BEFORE BREAKFAST EVERY DAY EXCEPT TAKE 1/2 TABLET 1 DAY A WEEK  . linaclotide (LINZESS) 145 MCG CAPS capsule Take 1 capsule (145 mcg total) by mouth daily before breakfast.  . midodrine (PROAMATINE) 5 MG tablet TAKE 1 TABLET BY MOUTH TWICE DAILY WITH A MEAL  .  ondansetron (ZOFRAN-ODT) 8 MG disintegrating tablet DISSOLVE 1 TABLET BY MOUTH EVERY 8 HOURS AS NEEDED FOR NAUSEA OR VOMITING  . Oxycodone HCl 10 MG TABS Take one tablet po q 4 hrs prn pain. Max 6 per day  . Oxycodone HCl 10 MG TABS Take one tablet po q 4 hrs prn pain. Max 6 per day  . Oxycodone HCl 10 MG TABS Take one tablet po q 4 hrs prn pain. Max 6 per day  . pantoprazole (PROTONIX) 40 MG tablet TAKE 1 TABLET BY MOUTH ONCE DAILY  . predniSONE (DELTASONE) 5 MG tablet Take by mouth.  . promethazine (PHENERGAN) 25 MG tablet TAKE 1 TABLET BY MOUTH EVERY 8 HOURS AS NEEDED FOR NAUSEA OR VOMITING  . rosuvastatin (CRESTOR) 5 MG tablet Take one tablet every evening  . [DISCONTINUED] levothyroxine (SYNTHROID) 75 MCG tablet TAKE 1 TABLET BY MOUTH BEFORE BREAKFAST EVERY DAY EXCEPT TAKE 1/2 TABLET 1 DAY A WEEK   No facility-administered encounter medications on file as of 12/04/2019.   ALLERGIES: Allergies  Allergen Reactions  . Bee Venom Anaphylaxis  . Other Other (See Comments)    Contrast Dye CT chest w/ contrast at OSH followed by SOB resolved w/ single dose steroids, never ENT swelling, intubation or pressors, has had multiple contrasted scans since  . Codeine Itching  . Morphine And Related Other (See Comments)    Unknown reaction- pt gets sick, dizzy, and confused Intolerance, not an allergy, received this in the emergency department recently without a problem.    VACCINATION STATUS: Immunization History  Administered Date(s) Administered  . Influenza Whole 06/27/2010, 07/28/2011  . Pneumococcal Conjugate-13 06/15/2014  . Pneumococcal Polysaccharide-23 06/10/2013    HPI Kathleen [name] is 59 y.o. female who is engaged in telehealth via telephone for follow-up of adrenal insufficiency and hypothyroidism.   She has no new complaints today.  She has been dealing with a few family emergency situations including hospitalized parents, and a daughter.  - Her history is complicated including  sarcoidosis which she dealt with for more than the last 10 years.  She is on regular follow-up with her pulmonologist.  Over the years, her therapy included high-dose steroids, up to 60 mg daily of prednisone, for the last 4-6 years.  She is currently on maintenance dose of prednisone 10 mg p.o. daily.  For adrenal  insufficiency, she was initiated and  kept on hydrocortisone 20 mg a.m, her PM dose  was discontinued during her last visit.  She has relatively stable course since her last visit. -She is more consistent taking her medications this time.  She also has hypothyroidism on levothyroxine 75 mcg p.o. every morning.  She avoided hospitalization, she still complains of the same symptoms including fatigue.  However, she has done very well in terms of her GI complaints, no vomiting/diarrhea.    -She has significant cognitive deficit, may be forgetting her medications at times. -Her weight is stable since last visit.   - The details of her diagnosis with adrenal insufficiency  is not available to review. -She is advised to wear medic alert. She denies any history of tuberculosis, incarceration, nor overseas travel . -She denies family history of adrenal, pituitary dysfunction. She has family history of hypothyroidism in one sibling. - Denies any history of intra-abdominal injury. She is on hydrocodone for chronic pain and inhaled bronchodilators. She is known to have orthostatic hypotension for which she is on midodrine.    Review of Systems Limited as above.  Objective:    BP 122/80   Pulse 93   Ht 5\' 10"  (1.778 m)   Wt 218 lb 3.2 oz (99 kg)   LMP 05/17/2016   BMI 31.31 kg/m   Wt Readings from Last 3 Encounters:  12/04/19 218 lb 3.2 oz (99 kg)  12/03/19 218 lb 3.2 oz (99 kg)  06/29/19 213 lb 14.4 oz (97 kg)    Physical Exam  Physical Exam- Limited  Constitutional:  She is  tearful this morning due to family stress. Body mass index is 31.31 kg/m. , not in acute distress,  normal state of mind Eyes:  EOMI, no exophthalmos Neck: Supple Thyroid: No gross goiter Respiratory: Adequate breathing efforts Musculoskeletal: no gross deformities, strength intact in all four extremities, no gross restriction of joint movements Skin:  no rashes, no hyperemia Neurological: no tremor with outstretched hands,    CMP ( most recent) CMP     Component Value Date/Time   NA 140 11/20/2019 1209   K 4.8 11/20/2019 1209   CL 103 11/20/2019 1209   CO2 25 11/20/2019 1209   GLUCOSE 93 11/20/2019 1209   GLUCOSE 90 11/02/2018 0556   BUN 11 11/20/2019 1209   CREATININE 0.85 11/20/2019 1209   CREATININE 0.85 08/26/2014 0831   CALCIUM 9.5 11/20/2019 1209   PROT 6.3 11/20/2019 1209   ALBUMIN 4.1 11/20/2019 1209   AST 14 11/20/2019 1209   ALT 12 11/20/2019 1209   ALKPHOS 86 11/20/2019 1209   BILITOT 0.3 11/20/2019 1209   GFRNONAA 76 11/20/2019 1209   GFRAA 88 11/20/2019 1209     Diabetic Labs (most recent): Lab Results  Component Value Date   HGBA1C 5.4 02/03/2019   HGBA1C 5.0 12/31/2016   HGBA1C 5.3 08/21/2016     Lipid Panel ( most recent) Lipid Panel     Component Value Date/Time   CHOL 227 (H) 06/29/2019 1537   TRIG 222 (H) 06/29/2019 1537   HDL 58 06/29/2019 1537   CHOLHDL 3.9 06/29/2019 1537   CHOLHDL 4.0 08/26/2014 0831   VLDL 31 08/26/2014 0831   LDLCALC 130 (H) 06/29/2019 1537      Lab Results  Component Value Date   TSH 0.689 11/20/2019   TSH 0.364 (L) 06/29/2019   TSH 0.746 02/03/2019   TSH 1.290 08/07/2018   TSH 2.280 04/17/2018   TSH 0.527 12/04/2017   TSH 0.242 (L) 10/22/2017  TSH 0.769 10/15/2017   TSH 0.545 08/09/2017   TSH 5.190 (H) 04/19/2017   FREET4 1.25 11/20/2019   FREET4 1.43 06/29/2019   FREET4 1.32 02/03/2019   FREET4 1.38 08/09/2017   FREET4 1.15 04/19/2017   FREET4 0.99 12/31/2016      Assessment & Plan:   1. Adrenal insufficiency ([place]) -I have reviewed her available records and clinically evaluated this  patient. She does have multiple risk factors for adrenal insufficiency including exposure to high-dose steroids related to her sarcoidosis, as well as opioid therapy related to diffuse pain. -  She has taken prednisone up to 60 mg daily for up to 6 years prior to her diagnosis with adrenal insufficiency. -She is on a stable dose of prednisone 10 mg p.o. daily related related to her sarcoidosis. -Given her recent symptoms indicative of adequate steroid replacement, hydrocortisone was added.   -For superimposed steroid induced adrenal insufficiency, she is on hydrocortisone supplement.  she is advised to continue only with morning hydrocortisone 20 mg 8 AM and skip her noon hydrocortisone 10 mg.    -She is also advised to wear medical alert affecting her diagnosis and need for steroids.   -She will not be considered for work-up by withdrawal state supports, risk of adrenal crisis.  -She is advised to go to ER if she cannot keep her medications down due to intractable vomiting. - She will be evaluated for the need for mineralocorticoid replacement which may help to stabilize her blood pressure. She is currently on medodrine 5 mg by mouth twice a day. - I had a long discussion with her regarding steroids sick day rules, risk of weight gain/diabetes and the need for periodic screening with A1c.  -Her last A1c was 5% indicating absence of prediabetes/diabetes.    2. Hypothyroidism:  -Her previsit thyroid function tests are consistent with appropriate replacement.  She keeps asking if she can take herself off of this for months.  It is impossible for her to come off of the hormones, she is advised to continue  levothyroxine 75 mcg p.o. every morning.  This was clearly communicated to the patient, and she voices understanding.  - We discussed about the correct intake of her thyroid hormone, on empty stomach at fasting, with water, separated by at least 30 minutes from breakfast and other medications,   and separated by more than 4 hours from calcium, iron, multivitamins, acid reflux medications (PPIs). -Patient is made aware of the fact that thyroid hormone replacement is needed for life, dose to be adjusted by periodic monitoring of thyroid function tests.   - I advised her  to maintain close follow up with [name], MD for primary care needs.     - Time spent on this patient care encounter:  25 minutes of which 50% was spent in  counseling and the rest reviewing  her current and  previous labs / studies and medications  doses and developing a plan for long term care. Kathleen [name]  participated in the discussions, expressed understanding, and voiced agreement with the above plans.  All questions were answered to her satisfaction. she is encouraged to contact clinic should she have any questions or concerns prior to her return visit.    Follow up plan: Return in about 6 months (around 06/04/2020) for Follow up with Pre-visit Labs.   Glade Lloyd, MD [facility] [address] [place], [place] [address] Phone: [phone]  Fax: [phone]  12/04/2019, 1:33 PM  This note was partially dictated with voice recognition software. Similar sounding words can be transcribed inadequately or may not  be corrected upon review.

## 2019-12-08 ENCOUNTER — Ambulatory Visit: Payer: 59 | Admitting: "Endocrinology

## 2019-12-09 ENCOUNTER — Telehealth: Payer: Self-pay | Admitting: Family Medicine

## 2019-12-09 NOTE — Telephone Encounter (Signed)
We do not recommend ibuprofen Naprosyn or Aleve or Goody powders  The low-dose of prednisone that she is on is something she can continue

## 2019-12-09 NOTE — Telephone Encounter (Signed)
Patient is wanting to know what medication not to take before taking her first shot on Friday. Please advise

## 2019-12-10 NOTE — Telephone Encounter (Signed)
Pt contacted and verbalized understanding.  

## 2019-12-11 DIAGNOSIS — Z23 Encounter for immunization: Secondary | ICD-10-CM | POA: Diagnosis not present

## 2019-12-18 DIAGNOSIS — J849 Interstitial pulmonary disease, unspecified: Secondary | ICD-10-CM | POA: Diagnosis not present

## 2019-12-18 DIAGNOSIS — J9601 Acute respiratory failure with hypoxia: Secondary | ICD-10-CM | POA: Diagnosis not present

## 2019-12-18 DIAGNOSIS — M6281 Muscle weakness (generalized): Secondary | ICD-10-CM | POA: Diagnosis not present

## 2019-12-18 DIAGNOSIS — D860 Sarcoidosis of lung: Secondary | ICD-10-CM | POA: Diagnosis not present

## 2019-12-18 MED FILL — LINZESS 145 MCG CAPSULE: 145 | 90 days supply | Qty: 90 | Fill #1

## 2019-12-18 MED FILL — MIDODRINE HCL 5 MG TABS: 5 | 30 days supply | Qty: 60 | Fill #1

## 2020-01-01 MED FILL — predniSONE 5 MG TABS: 5 | 90 days supply | Qty: 180 | Fill #0

## 2020-01-01 MED FILL — oxyCODONE HCL 10 MG TABS: 10 | 30 days supply | Qty: 180 | Fill #0

## 2020-01-05 ENCOUNTER — Encounter: Payer: Self-pay | Admitting: Family Medicine

## 2020-01-05 ENCOUNTER — Other Ambulatory Visit: Payer: Self-pay

## 2020-01-05 ENCOUNTER — Telehealth: Payer: Self-pay | Admitting: *Deleted

## 2020-01-05 ENCOUNTER — Ambulatory Visit (INDEPENDENT_AMBULATORY_CARE_PROVIDER_SITE_OTHER): Payer: 59 | Admitting: Family Medicine

## 2020-01-05 VITALS — BP 118/74 | HR 101 | Temp 97.8°F | Ht 70.0 in | Wt 217.0 lb

## 2020-01-05 DIAGNOSIS — R300 Dysuria: Secondary | ICD-10-CM

## 2020-01-05 DIAGNOSIS — L02411 Cutaneous abscess of right axilla: Secondary | ICD-10-CM

## 2020-01-05 LAB — POCT URINALYSIS DIPSTICK
Spec Grav, UA: 1.01 (ref 1.010–1.025)
pH, UA: 5 (ref 5.0–8.0)

## 2020-01-05 MED ORDER — SULFAMETHOXAZOLE-TRIMETHOPRIM 800-160 MG PO TABS: 1.0000 | ORAL_TABLET | Freq: Two times a day (BID) | ORAL | 0 refills | Status: DC

## 2020-01-05 NOTE — Progress Notes (Signed)
Patient ID: Kathleen [name], female    DOB: [birth year], 59 y.o.   MRN: [id number]   Chief Complaint  Patient presents with  . Urinary Tract Infection   Subjective:    HPI   "Knot" under right arm. Painful. Noticed it 2 days ago. Dysuria for 2 days. Unable to get urine sample. She uses straight catheters at home.   Rt axillary area with abscess. Had in  past in same area.  Tried warm compress.  Nothing expressed,  Has had this for 2 days.  No fever.   Pt also has concern of uti, h/o doing straight cath.  Didn't bring a cath today.  Occasionally can produce a urine w/o straight cath. Having burning with urination. H/o utis due to straight cathing. Seeing urology. Hasn't seen them in a while. No back pain or lower abd pain.  Dr.scott- gave cipro on last visit for UTI- last tablet was 1 month ago. Not blood or dark urine.  No cloudiness or odor.  Results for orders placed or performed in visit on 01/05/20  POCT urinalysis dipstick  Result Value Ref Range   Color, UA     Clarity, UA     Glucose, UA     Bilirubin, UA     Ketones, UA     Spec Grav, UA 1.010 1.010 - 1.025   Blood, UA     pH, UA 5.0 5.0 - 8.0   Protein, UA     Urobilinogen, UA     Nitrite, UA     Leukocytes, UA Small (1+) (A) Negative   Appearance     Odor      Medical History Kathleen [name] has a past medical history of Adrenal insufficiency ([facility]), Anemia, Avascular bone necrosis ([place]), Breast fibrocystic disorder, Chronic pain, Fibromyalgia, Gastroesophageal reflux disease, Mitral valve prolapse, Orthostatic hypotension, Peripheral neuropathy, Restrictive lung disease, Sarcoidosis, and SOB (shortness of breath).   Outpatient Encounter Medications as of 01/05/2020  Medication Sig  . acetaminophen (TYLENOL) 500 MG tablet Take 1,000 mg by mouth every 8 (eight) hours as needed for headache.  . albuterol (PROVENTIL) (2.5 MG/3ML) 0.083% nebulizer solution Take 3 mLs (2.5 mg total) by nebulization every 2 (two)  hours as needed for wheezing or shortness of breath.  . EPINEPHrine 0.3 mg/0.3 mL IJ SOAJ injection Inject 0.3 mLs (0.3 mg total) into the muscle as needed for anaphylaxis.  . ferrous sulfate 325 (65 FE) MG EC tablet Take 325 mg by mouth daily with breakfast.  . hydrocortisone (CORTEF) 10 MG tablet TAKE 2 TABLETS BY MOUTH AT 8AM  . levothyroxine (SYNTHROID) 75 MCG tablet TAKE 1 TABLET BY MOUTH BEFORE BREAKFAST EVERY DAY EXCEPT TAKE 1/2 TABLET 1 DAY A WEEK  . linaclotide (LINZESS) 145 MCG CAPS capsule Take 1 capsule (145 mcg total) by mouth daily before breakfast.  . midodrine (PROAMATINE) 5 MG tablet TAKE 1 TABLET BY MOUTH TWICE DAILY WITH A MEAL  . ondansetron (ZOFRAN-ODT) 8 MG disintegrating tablet DISSOLVE 1 TABLET BY MOUTH EVERY 8 HOURS AS NEEDED FOR NAUSEA OR VOMITING  . Oxycodone HCl 10 MG TABS Take one tablet po q 4 hrs prn pain. Max 6 per day  . Oxycodone HCl 10 MG TABS Take one tablet po q 4 hrs prn pain. Max 6 per day  . Oxycodone HCl 10 MG TABS Take one tablet po q 4 hrs prn pain. Max 6 per day  . pantoprazole (PROTONIX) 40 MG tablet TAKE 1 TABLET BY MOUTH ONCE DAILY  .  predniSONE (DELTASONE) 5 MG tablet Take by mouth.  . promethazine (PHENERGAN) 25 MG tablet TAKE 1 TABLET BY MOUTH EVERY 8 HOURS AS NEEDED FOR NAUSEA OR VOMITING  . rosuvastatin (CRESTOR) 5 MG tablet Take one tablet every evening  . [DISCONTINUED] ciprofloxacin (CIPRO) 500 MG tablet Take 1 tablet (500 mg total) by mouth 2 (two) times daily.  . [DISCONTINUED] ciprofloxacin-dexamethasone (CIPRODEX) OTIC suspension Place 4 drops into the right ear 2 (two) times daily.  Marland Kitchen sulfamethoxazole-trimethoprim (BACTRIM DS) 800-160 MG tablet Take 1 tablet by mouth 2 (two) times daily.   No facility-administered encounter medications on file as of 01/05/2020.     Review of Systems  Gastrointestinal: Negative for abdominal pain, diarrhea, nausea and vomiting.  Genitourinary: Positive for dysuria. Negative for difficulty urinating,  frequency, genital sores, hematuria, urgency, vaginal bleeding, vaginal discharge and vaginal pain.  Musculoskeletal: Negative for arthralgias and back pain.  Skin: Negative for rash.       +small nodules in rt axilla     Vitals BP 118/74   Pulse (!) 101   Temp 97.8 F (36.6 C)   Ht 5\' 10"  (1.778 m)   Wt 217 lb (98.4 kg)   LMP 05/17/2016   SpO2 96%   BMI 31.14 kg/m   Objective:   Physical Exam Vitals and nursing note reviewed.  Constitutional:      General: She is not in acute distress.    Appearance: Normal appearance.  Cardiovascular:     Rate and Rhythm: Regular rhythm. Tachycardia present.  Pulmonary:     Effort: Pulmonary effort is normal. No respiratory distress.  Abdominal:     General: There is no distension.     Tenderness: There is no abdominal tenderness. There is no guarding or rebound.  Musculoskeletal:        General: Normal range of motion.  Skin:    General: Skin is warm and dry.     Comments: +3 small nodules under rt axilla, no warmth, fluctuance.  All smaller than 1cm.  Mild erythema on one nodule.  No surrounding cellulitis.  Neurological:     Mental Status: She is alert.      Assessment and Plan   1. Dysuria - POCT urinalysis dipstick - Urine Culture  2. Abscess of axilla, right   Dysuria- UA- small LE, neg nit or blood. all others negative. Micro- negative.  Cont to monitor urine can call or rto ir worsening. ordered urine culture.  Abscesses- rt axilla- all very small, and no fluctuance.  Not at point for successful I&D. will treat with bactrim and cont with warm compress.  rto in 2-3 days for I&D if needed. Pt in agreement with plan.  F/u prn.  01/05/2020

## 2020-01-05 NOTE — Telephone Encounter (Signed)
Patient notified and scheduled follow up with Dr Nicki Reaper and will get her blood work completed before the visit.

## 2020-01-05 NOTE — Telephone Encounter (Signed)
The labs ordered on 11/17/2019 are still active I would recommend doing those-please see labs section  Should also be noted that Dr. Dorris Fetch checked her thyroid function as well as metabolic 7 and liver therefore we do not need to do that

## 2020-01-05 NOTE — Telephone Encounter (Signed)
Pt would like for dr scott to order labs. Would like orders today if possible. Last labs 12/04/19 tsh, free t4, cmp

## 2020-01-06 DIAGNOSIS — E785 Hyperlipidemia, unspecified: Secondary | ICD-10-CM | POA: Diagnosis not present

## 2020-01-06 DIAGNOSIS — E559 Vitamin D deficiency, unspecified: Secondary | ICD-10-CM | POA: Diagnosis not present

## 2020-01-06 DIAGNOSIS — Z79899 Other long term (current) drug therapy: Secondary | ICD-10-CM | POA: Diagnosis not present

## 2020-01-07 ENCOUNTER — Telehealth: Payer: Self-pay | Admitting: Family Medicine

## 2020-01-07 LAB — HEPATIC FUNCTION PANEL
ALT: 11 IU/L (ref 0–32)
AST: 14 IU/L (ref 0–40)
Albumin: 4.1 g/dL (ref 3.8–4.9)
Alkaline Phosphatase: 79 IU/L (ref 39–117)
Bilirubin Total: 0.3 mg/dL (ref 0.0–1.2)
Bilirubin, Direct: 0.1 mg/dL (ref 0.00–0.40)
Total Protein: 6.3 g/dL (ref 6.0–8.5)

## 2020-01-07 LAB — LIPID PANEL
Chol/HDL Ratio: 4.4 ratio (ref 0.0–4.4)
Cholesterol, Total: 213 mg/dL — ABNORMAL HIGH (ref 100–199)
HDL: 48 mg/dL (ref >39)
LDL Chol Calc (NIH): 133 mg/dL — ABNORMAL HIGH (ref 0–99)
Triglycerides: 182 mg/dL — ABNORMAL HIGH (ref 0–149)
VLDL Cholesterol Cal: 32 mg/dL (ref 5–40)

## 2020-01-07 LAB — URINE CULTURE

## 2020-01-07 LAB — VITAMIN D 25 HYDROXY (VIT D DEFICIENCY, FRACTURES): Vit D, 25-Hydroxy: 20.3 ng/mL — ABNORMAL LOW (ref 30.0–100.0)

## 2020-01-07 MED ORDER — CLINDAMYCIN HCL 300 MG PO CAPS: 300.0000 mg | ORAL_CAPSULE | Freq: Three times a day (TID) | ORAL | 0 refills | Status: DC

## 2020-01-07 NOTE — Addendum Note (Signed)
Addended by: Dairl Ponder on: 01/07/2020 11:07 AM   Modules accepted: Orders

## 2020-01-07 NOTE — Telephone Encounter (Signed)
Kathleen [name], Malena M, DO     For the abscess under arm we can change medication to clindamycin.  Her urine grew out skin flora, so no UTI at this time. If worsening pain in bladder to rto.   Thx,  Dr. Lovena [name]

## 2020-01-07 NOTE — Telephone Encounter (Signed)
Left message to return call 

## 2020-01-07 NOTE — Telephone Encounter (Signed)
Pt states sulfamethoxazole-trimethoprim (BACTRIM DS) 800-160 MG tablet is making her nauseous and would like to know what she should do or if something else can be called in.  [id number] - [place], [address]

## 2020-01-08 ENCOUNTER — Ambulatory Visit
Admission: EM | Admit: 2020-01-08 | Discharge: 2020-01-08 | Disposition: A | Discharge status: Home / Self Care | Payer: 59

## 2020-01-08 ENCOUNTER — Other Ambulatory Visit: Payer: Self-pay

## 2020-01-08 DIAGNOSIS — L02411 Cutaneous abscess of right axilla: Secondary | ICD-10-CM | POA: Diagnosis not present

## 2020-01-08 MED ORDER — DOXYCYCLINE HYCLATE 100 MG PO TABS: 100.0000 mg | ORAL_TABLET | Freq: Two times a day (BID) | ORAL | 0 refills | Status: DC

## 2020-01-08 NOTE — Telephone Encounter (Signed)
Prescription sent electronically to pharmacy. Patient notified.  Called [facility] urgent care in [place] and they stated they do lance abscesses and have minimal wait time at the moment. Patient notified and verbalized understanding.

## 2020-01-08 NOTE — Telephone Encounter (Signed)
Left message to return call 

## 2020-01-08 NOTE — ED Triage Notes (Signed)
Pt has abscess under right arm, sent by PCP office for I&D. Pt reports PCP office calling in doxycyline today.

## 2020-01-08 NOTE — Telephone Encounter (Signed)
Patient advised that the new medication was sent to the pharmacy. Patient states she has a lot of allergies and has never taken that med before and she usually takes doxycycline for this.  Patient also states that the abscess is very painful and she feels it needs to be lanced to get the pus out for it to get better- states it normally has to be lanced.

## 2020-01-08 NOTE — Addendum Note (Signed)
Addended by: Dairl Ponder on: 01/08/2020 02:10 PM   Modules accepted: Orders

## 2020-01-08 NOTE — ED Provider Notes (Addendum)
[place]   [id number] 01/08/20 Arrival Time: 34   [id number]  SUBJECTIVE:  Kathleen [name] is a 59 y.o. female who presents with a possible abscess of her RT axilla x few days. Denies precipitating event or trauma.  Has tried taking antibiotics without relief.  Sent by Dr. Lance Sell office to have abscess drained.  Reports similar symptoms in the past that improved with lancing.  Denies fever, nausea, vomiting, drainage.     ROS: As per HPI.  All other pertinent ROS negative.     Past Medical History:  Diagnosis Date  . Adrenal insufficiency ([place])   . Anemia   . Avascular bone necrosis ([place])   . Breast fibrocystic disorder   . Chronic pain   . Fibromyalgia   . Gastroesophageal reflux disease   . Mitral valve prolapse   . Orthostatic hypotension   . Peripheral neuropathy   . Restrictive lung disease    Moderate to severe  . Sarcoidosis    Biopsy proven - [facility]  . SOB (shortness of breath)    chronic   Past Surgical History:  Procedure Laterality Date  . BIOPSY  10/17/2018   Procedure: BIOPSY;  Surgeon: Rogene Houston, MD;  [facility];  Service: Endoscopy;;  duodenum, antrum, gastric body  . BREAST LUMPECTOMY  01/12/2011   right  . ESOPHAGOGASTRODUODENOSCOPY (EGD) WITH PROPOFOL N/A 10/17/2018   Procedure: ESOPHAGOGASTRODUODENOSCOPY (EGD) WITH PROPOFOL;  Surgeon: Rogene Houston, MD;  [facility];  Service: Endoscopy;  Laterality: N/A;  2:25  . TUBAL LIGATION     Allergies  Allergen Reactions  . Bee Venom Anaphylaxis  . Other Other (See Comments)    Contrast Dye CT chest w/ contrast at OSH followed by SOB resolved w/ single dose steroids, never ENT swelling, intubation or pressors, has had multiple contrasted scans since  . Bactrim [Sulfamethoxazole-Trimethoprim] Nausea And Vomiting  . Codeine Itching  . Morphine And Related Other (See Comments)    Unknown reaction- pt gets sick, dizzy, and confused Intolerance, not an allergy,  received this in the emergency department recently without a problem.   No current facility-administered medications on file prior to encounter.   Current Outpatient Medications on File Prior to Encounter  Medication Sig Dispense Refill  . acetaminophen (TYLENOL) 500 MG tablet Take 1,000 mg by mouth every 8 (eight) hours as needed for headache.    . albuterol (PROVENTIL) (2.5 MG/3ML) 0.083% nebulizer solution Take 3 mLs (2.5 mg total) by nebulization every 2 (two) hours as needed for wheezing or shortness of breath. 75 mL 12  . doxycycline (VIBRA-TABS) 100 MG tablet Take 1 tablet (100 mg total) by mouth 2 (two) times daily. 20 tablet 0  . EPINEPHrine 0.3 mg/0.3 mL IJ SOAJ injection Inject 0.3 mLs (0.3 mg total) into the muscle as needed for anaphylaxis. 1 each 0  . ferrous sulfate 325 (65 FE) MG EC tablet Take 325 mg by mouth daily with breakfast.    . hydrocortisone (CORTEF) 10 MG tablet TAKE 2 TABLETS BY MOUTH AT 8AM 180 tablet 0  . levothyroxine (SYNTHROID) 75 MCG tablet TAKE 1 TABLET BY MOUTH BEFORE BREAKFAST EVERY DAY EXCEPT TAKE 1/2 TABLET 1 DAY A WEEK 90 tablet 1  . linaclotide (LINZESS) 145 MCG CAPS capsule Take 1 capsule (145 mcg total) by mouth daily before breakfast. 90 capsule 1  . midodrine (PROAMATINE) 5 MG tablet TAKE 1 TABLET BY MOUTH TWICE DAILY WITH A MEAL 60 tablet 5  . ondansetron (  ZOFRAN-ODT) 8 MG disintegrating tablet DISSOLVE 1 TABLET BY MOUTH EVERY 8 HOURS AS NEEDED FOR NAUSEA OR VOMITING 12 tablet 5  . Oxycodone HCl 10 MG TABS Take one tablet po q 4 hrs prn pain. Max 6 per day 180 tablet 0  . Oxycodone HCl 10 MG TABS Take one tablet po q 4 hrs prn pain. Max 6 per day 180 tablet 0  . Oxycodone HCl 10 MG TABS Take one tablet po q 4 hrs prn pain. Max 6 per day 180 tablet 0  . pantoprazole (PROTONIX) 40 MG tablet TAKE 1 TABLET BY MOUTH ONCE DAILY 90 tablet 1  . predniSONE (DELTASONE) 5 MG tablet Take by mouth.    . promethazine (PHENERGAN) 25 MG tablet TAKE 1 TABLET BY MOUTH  EVERY 8 HOURS AS NEEDED FOR NAUSEA OR VOMITING 25 tablet 3  . rosuvastatin (CRESTOR) 5 MG tablet Take one tablet every evening 90 tablet 1   Social History   Socioeconomic History  . Marital status: Married    Spouse name: Not on file  . Number of children: 1  . Years of education: Not on file  . Highest education level: Not on file  Occupational History  . Occupation: Teacher, early years/pre: [employer]  Tobacco Use  . Smoking status: Never Smoker  . Smokeless tobacco: Never Used  Substance and Sexual Activity  . Alcohol use: No  . Drug use: No  . Sexual activity: Yes  Other Topics Concern  . Not on file  Social History Narrative   Daily caffeine    Social Determinants of Health   Financial Resource Strain:   . Difficulty of Paying Living Expenses:   Food Insecurity:   . Worried About Charity fundraiser in the Last Year:   . Arboriculturist in the Last Year:   Transportation Needs:   . Film/video editor (Medical):   Marland Kitchen Lack of Transportation (Non-Medical):   Physical Activity:   . Days of Exercise per Week:   . Minutes of Exercise per Session:   Stress:   . Feeling of Stress :   Social Connections:   . Frequency of Communication with Friends and Family:   . Frequency of Social Gatherings with Friends and Family:   . Attends Religious Services:   . Active Member of Clubs or Organizations:   . Attends Archivist Meetings:   Marland Kitchen Marital Status:   Intimate Partner Violence:   . Fear of Current or Ex-Partner:   . Emotionally Abused:   Marland Kitchen Physically Abused:   . Sexually Abused:    Family History  Problem Relation Age of Onset  . Cardiomyopathy Mother   . Breast cancer Mother   . Prostate cancer Father   . Neurofibromatosis Brother   . Heart disease Maternal Aunt   . Bipolar disorder Daughter   . Colon cancer Neg Hx     OBJECTIVE:  Vitals:   01/08/20 1416  BP: 118/74  Pulse: 65  Resp: 16  Temp: (!) 97 F (36.1 C)  SpO2: 96%      General appearance: alert; no distress Skin: two 1-1.5 cm areas of induration of her RT axilla; tender to touch; no active drainage Psychological: alert and cooperative; normal mood and affect  Procedure: Incision and drainage x 2 Verbal consent obtained. Area over induration cleaned with betadine. Lidocaine 2% with epinephrine used to obtain local anesthesia. The most fluctuant portion of the abscesses were incised with a #11  blade scalpel. Abscess cavities explored and evacuated. Loculations broken up with a curved hemostat as best as possible given patient discomfort. Cavity not packed due to patient's discomfort. Minimal bleeding. No complications.    ASSESSMENT & PLAN:  1. Abscess of right axilla    Keep dry and covered for next 24-48 hours After packing is removed in you may then begin appling warm compresses 3-4x daily for 10-15 minutes.  You may then wash site daily with warm water and mild soap Keep covered to avoid friction Take antibiotic as prescribed and to completion Return or go to the ED if you have any new or worsening symptoms such as increased redness, swelling, pain, nausea, vomiting, fever, chills, etc...   Reviewed expectations re: course of current medical issues. Questions answered. Outlined signs and symptoms indicating need for more acute intervention. Patient verbalized understanding. After Visit Summary given.          Lestine Box, PA-C 01/08/20 [place], [place], PA-C 01/08/20 1513

## 2020-01-08 NOTE — Discharge Instructions (Signed)
Keep dry and covered for next 24-48 hours After packing is removed in you may then begin appling warm compresses 3-4x daily for 10-15 minutes.  You may then wash site daily with warm water and mild soap Keep covered to avoid friction Take antibiotic as prescribed and to completion Return or go to the ED if you have any new or worsening symptoms such as increased redness, swelling, pain, nausea, vomiting, fever, chills, etc..Marland Kitchen

## 2020-01-08 NOTE — Telephone Encounter (Signed)
Change to doxy 100 bid tyen d, no fluctulance the other day so not lancing then was appropriater, if has progressed pt may need to go to [facility]

## 2020-01-12 NOTE — Telephone Encounter (Signed)
Please sign encounter 

## 2020-01-14 ENCOUNTER — Other Ambulatory Visit: Payer: Self-pay | Admitting: *Deleted

## 2020-01-14 MED ORDER — VITAMIN D (ERGOCALCIFEROL) 1.25 MG (50000 UNIT) PO CAPS: 50000.0000 [IU] | ORAL_CAPSULE | ORAL | 1 refills | Status: DC

## 2020-01-14 MED ORDER — ROSUVASTATIN CALCIUM 10 MG PO TABS: 10.0000 mg | ORAL_TABLET | Freq: Every day | ORAL | 1 refills | Status: DC

## 2020-01-14 MED FILL — ROSUVASTATIN CALCIUM 10 MG: 10 | 90 days supply | Qty: 90 | Fill #0

## 2020-01-14 MED FILL — VIT D2 1.25 MG (50,000 UNIT: 1.25 MG | 28 days supply | Qty: 4 | Fill #0

## 2020-01-15 MED FILL — predniSONE 5 MG TABS: 5 | 90 days supply | Qty: 180 | Fill #0

## 2020-01-15 MED FILL — ONDANSETRON ODT 8 MG TABLET: 8 | 8 days supply | Qty: 24 | Fill #3

## 2020-01-17 DIAGNOSIS — D860 Sarcoidosis of lung: Secondary | ICD-10-CM | POA: Diagnosis not present

## 2020-01-17 DIAGNOSIS — J9601 Acute respiratory failure with hypoxia: Secondary | ICD-10-CM | POA: Diagnosis not present

## 2020-01-17 DIAGNOSIS — J849 Interstitial pulmonary disease, unspecified: Secondary | ICD-10-CM | POA: Diagnosis not present

## 2020-01-17 DIAGNOSIS — M6281 Muscle weakness (generalized): Secondary | ICD-10-CM | POA: Diagnosis not present

## 2020-01-26 ENCOUNTER — Other Ambulatory Visit: Payer: Self-pay

## 2020-01-26 ENCOUNTER — Inpatient Hospital Stay (HOSPITAL_COMMUNITY)
Admission: EM | Admit: 2020-01-26 | Discharge: 2020-01-28 | DRG: 392 | Disposition: A | Discharge status: Home / Self Care | Payer: 59 | Attending: Family Medicine | Admitting: Family Medicine

## 2020-01-26 ENCOUNTER — Encounter (HOSPITAL_COMMUNITY): Payer: Self-pay | Admitting: Emergency Medicine

## 2020-01-26 ENCOUNTER — Emergency Department (HOSPITAL_COMMUNITY): Payer: 59

## 2020-01-26 ENCOUNTER — Telehealth: Payer: Self-pay | Admitting: "Endocrinology

## 2020-01-26 DIAGNOSIS — Z79899 Other long term (current) drug therapy: Secondary | ICD-10-CM

## 2020-01-26 DIAGNOSIS — Z7952 Long term (current) use of systemic steroids: Secondary | ICD-10-CM | POA: Diagnosis not present

## 2020-01-26 DIAGNOSIS — K5903 Drug induced constipation: Secondary | ICD-10-CM | POA: Diagnosis present

## 2020-01-26 DIAGNOSIS — I959 Hypotension, unspecified: Secondary | ICD-10-CM | POA: Diagnosis present

## 2020-01-26 DIAGNOSIS — K219 Gastro-esophageal reflux disease without esophagitis: Secondary | ICD-10-CM | POA: Diagnosis present

## 2020-01-26 DIAGNOSIS — R9431 Abnormal electrocardiogram [ECG] [EKG]: Secondary | ICD-10-CM | POA: Diagnosis not present

## 2020-01-26 DIAGNOSIS — E271 Primary adrenocortical insufficiency: Secondary | ICD-10-CM | POA: Diagnosis present

## 2020-01-26 DIAGNOSIS — Z79890 Hormone replacement therapy (postmenopausal): Secondary | ICD-10-CM | POA: Diagnosis not present

## 2020-01-26 DIAGNOSIS — M797 Fibromyalgia: Secondary | ICD-10-CM | POA: Diagnosis present

## 2020-01-26 DIAGNOSIS — E039 Hypothyroidism, unspecified: Secondary | ICD-10-CM | POA: Diagnosis present

## 2020-01-26 DIAGNOSIS — J45909 Unspecified asthma, uncomplicated: Secondary | ICD-10-CM | POA: Diagnosis present

## 2020-01-26 DIAGNOSIS — G629 Polyneuropathy, unspecified: Secondary | ICD-10-CM | POA: Diagnosis present

## 2020-01-26 DIAGNOSIS — Z20822 Contact with and (suspected) exposure to covid-19: Secondary | ICD-10-CM | POA: Diagnosis present

## 2020-01-26 DIAGNOSIS — I10 Essential (primary) hypertension: Secondary | ICD-10-CM | POA: Diagnosis present

## 2020-01-26 DIAGNOSIS — R112 Nausea with vomiting, unspecified: Secondary | ICD-10-CM | POA: Diagnosis present

## 2020-01-26 DIAGNOSIS — E2740 Unspecified adrenocortical insufficiency: Secondary | ICD-10-CM | POA: Diagnosis present

## 2020-01-26 DIAGNOSIS — T402X5A Adverse effect of other opioids, initial encounter: Secondary | ICD-10-CM | POA: Diagnosis present

## 2020-01-26 DIAGNOSIS — R109 Unspecified abdominal pain: Secondary | ICD-10-CM | POA: Diagnosis not present

## 2020-01-26 DIAGNOSIS — K529 Noninfective gastroenteritis and colitis, unspecified: Secondary | ICD-10-CM | POA: Diagnosis present

## 2020-01-26 LAB — COMPREHENSIVE METABOLIC PANEL
ALT: 17 U/L (ref 0–44)
AST: 17 U/L (ref 15–41)
Albumin: 4.1 g/dL (ref 3.5–5.0)
Alkaline Phosphatase: 73 U/L (ref 38–126)
Anion gap: 11 (ref 5–15)
BUN: 9 mg/dL (ref 6–20)
CO2: 25 mmol/L (ref 22–32)
Calcium: 9.3 mg/dL (ref 8.9–10.3)
Chloride: 101 mmol/L (ref 98–111)
Creatinine, Ser: 0.76 mg/dL (ref 0.44–1.00)
GFR calc Af Amer: >60 mL/min (ref >60)
GFR calc non Af Amer: >60 mL/min (ref >60)
Glucose, Bld: 91 mg/dL (ref 70–99)
Potassium: 3.7 mmol/L (ref 3.5–5.1)
Sodium: 137 mmol/L (ref 135–145)
Total Bilirubin: 0.8 mg/dL (ref 0.3–1.2)
Total Protein: 7.4 g/dL (ref 6.5–8.1)

## 2020-01-26 LAB — URINALYSIS, ROUTINE W REFLEX MICROSCOPIC
Bilirubin Urine: NEGATIVE
Glucose, UA: NEGATIVE mg/dL
Hgb urine dipstick: NEGATIVE
Ketones, ur: NEGATIVE mg/dL
Leukocytes,Ua: NEGATIVE
Nitrite: NEGATIVE
Protein, ur: NEGATIVE mg/dL
Specific Gravity, Urine: 1.008 (ref 1.005–1.030)
pH: 7 (ref 5.0–8.0)

## 2020-01-26 LAB — SARS CORONAVIRUS 2 BY RT PCR (HOSPITAL ORDER, PERFORMED IN ~~LOC~~ HOSPITAL LAB): SARS Coronavirus 2: NEGATIVE

## 2020-01-26 LAB — CBC
HCT: 40 % (ref 36.0–46.0)
Hemoglobin: 13 g/dL (ref 12.0–15.0)
MCH: 29 pg (ref 26.0–34.0)
MCHC: 32.5 g/dL (ref 30.0–36.0)
MCV: 89.3 fL (ref 80.0–100.0)
Platelets: 371 10*3/uL (ref 150–400)
RBC: 4.48 MIL/uL (ref 3.87–5.11)
RDW: 14.3 % (ref 11.5–15.5)
WBC: 8.9 10*3/uL (ref 4.0–10.5)
nRBC: 0 % (ref 0.0–0.2)

## 2020-01-26 LAB — LIPASE, BLOOD: Lipase: 27 U/L (ref 11–51)

## 2020-01-26 MED ORDER — SODIUM CHLORIDE 0.9 % IV BOLUS
500.0000 mL | Freq: Once | INTRAVENOUS | Status: AC
  Administered 2020-01-26: 500 mL via INTRAVENOUS

## 2020-01-26 MED ORDER — HYDROCORTISONE NA SUCCINATE PF 100 MG IJ SOLR
100.0000 mg | Freq: Once | INTRAMUSCULAR | Status: AC
  Administered 2020-01-26: 100 mg via INTRAVENOUS
  Filled 2020-01-26: qty 2

## 2020-01-26 MED ORDER — ONDANSETRON HCL 4 MG/2ML IJ SOLN
4.0000 mg | Freq: Once | INTRAMUSCULAR | Status: AC
  Administered 2020-01-26: 4 mg via INTRAVENOUS
  Filled 2020-01-26: qty 2

## 2020-01-26 MED ORDER — FENTANYL CITRATE (PF) 100 MCG/2ML IJ SOLN
50.0000 ug | Freq: Once | INTRAMUSCULAR | Status: AC
  Administered 2020-01-26: 50 ug via INTRAVENOUS
  Filled 2020-01-26: qty 2

## 2020-01-26 NOTE — Telephone Encounter (Signed)
Pt said she can not keep her medicine down, she has been throwing up the last few days. She is not able to eat anything after breakfast. She thinks its something with her adrenal sufficiency. Can you give her a recommendation? Last time she had to go to the hospital and stay over night.

## 2020-01-26 NOTE — Telephone Encounter (Signed)
Discussed with pt, understanding voiced. 

## 2020-01-26 NOTE — Telephone Encounter (Signed)
She has to go to the hospital again if she can not keep her medications down.

## 2020-01-26 NOTE — ED Provider Notes (Signed)
[facility] EMERGENCY DEPARTMENT Provider Note   CSN: [id number] Arrival date & time: 01/26/20  1816     History Chief Complaint  Patient presents with  . Emesis    Kathleen [name] is a 59 y.o. female with a past medical history consistent for sarcoidosis, fibromyalgia, avascular bone necrosis, Addison's disease/adrenal insufficiency, mitral valve prolapse who presents today for evaluation of nausea, vomiting, and inability to tolerate p.o. including her steroids over the past 2 days.  She reports mild pain in her left lower abdominal quadrant.  She states that she is chronically constipated because of her home pain medicines however her last bowel movement was yesterday and was normal for her.  No fevers.  She states that normally when she has a Transport planner" of her adrenal insufficiency her meds will stop working, she will get nauseous, abdominal pain and require admission for IV fluids and steroids.  She has not given her self any IM steroids at home.   No fevers.  She is fully vaccinated against covid. She has not had increased dose steroids at home.   HPI     Past Medical History:  Diagnosis Date  . Adrenal insufficiency ([place])   . Anemia   . Avascular bone necrosis ([place])   . Breast fibrocystic disorder   . Chronic pain   . Fibromyalgia   . Gastroesophageal reflux disease   . Mitral valve prolapse   . Orthostatic hypotension   . Peripheral neuropathy   . Restrictive lung disease    Moderate to severe  . Sarcoidosis    Biopsy proven - [facility]  . SOB (shortness of breath)    chronic    Patient Active Problem List   Diagnosis Date Noted  . Interstitial lung disease ([place]) 04/14/2019  . Non-intractable vomiting 09/22/2018  . Adrenal insufficiency (Addison's disease) ([place])   . Nausea vomiting and diarrhea   . Acute adrenal crisis ([place]) 10/15/2017  . Gastroesophageal reflux disease 10/15/2017  . Personal history of noncompliance with medical treatment, presenting hazards to health  04/26/2017  . Hypothyroidism 01/15/2017  . Symptomatic bradycardia   . Acute respiratory failure with hypoxia ([place])   . Adrenal insufficiency ([place]) 08/17/2016  . Hypokalemia 08/17/2016  . Chronic pain syndrome 04/20/2016  . Vaginal bleeding 01/02/2016  . Hypovolemic shock ([place]) 12/30/2015  . Nausea and vomiting 12/29/2015  . Hypotension 12/29/2015  . Constipation 11/14/2015  . Avascular necrosis of bone of hip, left ([place]) 04/05/2015  . Bimalleolar fracture of right ankle 02/11/2015  . Post herpetic neuralgia 10/22/2014  . Hyperlipidemia 08/25/2014  . Obstructive sleep apnea 02/18/2014  . Obesity, unspecified 05/06/2013  . Fibromyalgia 02/11/2013  . Depression 02/11/2013  . Urinary retention 02/02/2013  . Pedal edema 01/22/2013  . Orthostatic hypotension 01/16/2013  . Helicobacter pylori gastritis 05/23/2012  . Fibrocystic breast disease in female 04/09/2012  . Anxiety state 10/19/2010  . CHEST PAIN 11/22/2008  . Sarcoidosis 06/21/2008    Past Surgical History:  Procedure Laterality Date  . BIOPSY  10/17/2018   Procedure: BIOPSY;  Surgeon: Rogene Houston, MD;  [facility];  Service: Endoscopy;;  duodenum, antrum, gastric body  . BREAST LUMPECTOMY  01/12/2011   right  . ESOPHAGOGASTRODUODENOSCOPY (EGD) WITH PROPOFOL N/A 10/17/2018   Procedure: ESOPHAGOGASTRODUODENOSCOPY (EGD) WITH PROPOFOL;  Surgeon: Rogene Houston, MD;  [facility];  Service: Endoscopy;  Laterality: N/A;  2:25  . TUBAL LIGATION       OB History    Gravida  1  Para  1   Term  1   Preterm      AB      Living        SAB      TAB      Ectopic      Multiple      Live Births              Family History  Problem Relation Age of Onset  . Cardiomyopathy Mother   . Breast cancer Mother   . Prostate cancer Father   . Neurofibromatosis Brother   . Heart disease Maternal Aunt   . Bipolar disorder Daughter   . Colon cancer Neg Hx     Social History    Tobacco Use  . Smoking status: Never Smoker  . Smokeless tobacco: Never Used  Substance Use Topics  . Alcohol use: No  . Drug use: No    Home Medications Prior to Admission medications   Medication Sig Start Date End Date Taking? Authorizing Provider  acetaminophen (TYLENOL) 500 MG tablet Take 1,000 mg by mouth every 8 (eight) hours as needed for headache.   Yes [provider]  docusate sodium (COLACE) 100 MG capsule Take 100 mg by mouth 2 (two) times daily.   Yes [provider]  EPINEPHrine 0.3 mg/0.3 mL IJ SOAJ injection Inject 0.3 mLs (0.3 mg total) into the muscle as needed for anaphylaxis. 09/15/19  Yes Luking, Elayne Snare, MD  ferrous sulfate 325 (65 FE) MG EC tablet Take 325 mg by mouth daily with breakfast.   Yes [provider]  hydrocortisone (CORTEF) 10 MG tablet TAKE 2 TABLETS BY MOUTH AT 8AM Patient taking differently: Take 20 mg by mouth in the morning.  11/25/19  Yes Nida, Marella Chimes, MD  levothyroxine (SYNTHROID) 75 MCG tablet TAKE 1 TABLET BY MOUTH BEFORE BREAKFAST EVERY DAY EXCEPT TAKE 1/2 TABLET 1 DAY A WEEK Patient taking differently: Take 75 mcg by mouth daily before breakfast.  12/04/19  Yes Nida, Marella Chimes, MD  linaclotide [name]) 145 MCG CAPS capsule Take 1 capsule (145 mcg total) by mouth daily before breakfast. 09/15/19  Yes Luking, Scott A, MD  midodrine (PROAMATINE) 5 MG tablet TAKE 1 TABLET BY MOUTH TWICE DAILY WITH A MEAL Patient taking differently: Take 5 mg by mouth in the morning. TAKE 1 TABLET BY MOUTH TWICE DAILY WITH A MEAL 11/06/19  Yes Luking, Scott A, MD  ondansetron (ZOFRAN-ODT) 8 MG disintegrating tablet DISSOLVE 1 TABLET BY MOUTH EVERY 8 HOURS AS NEEDED FOR NAUSEA OR VOMITING Patient taking differently: Take 8 mg by mouth every 8 (eight) hours as needed for nausea or vomiting.  08/11/19  Yes Luking, Scott A, MD  Oxycodone HCl 10 MG TABS Take one tablet po q 4 hrs prn pain. Max 6 per day Patient taking differently:  Take 10 mg by mouth every 4 (four) hours.  11/17/19  Yes Luking, Scott A, MD  pantoprazole (PROTONIX) 40 MG tablet TAKE 1 TABLET BY MOUTH ONCE DAILY Patient taking differently: Take 40 mg by mouth daily.  10/09/19  Yes Luking, Elayne Snare, MD  predniSONE (DELTASONE) 5 MG tablet Take 5 mg by mouth daily.  02/23/19 02/18/20 Yes [provider]  promethazine (PHENERGAN) 25 MG tablet TAKE 1 TABLET BY MOUTH EVERY 8 HOURS AS NEEDED FOR NAUSEA OR VOMITING Patient taking differently: Take 25 mg by mouth every 8 (eight) hours as needed for nausea or vomiting.  04/28/19  Yes [name], MD  rosuvastatin (  CRESTOR) 10 MG tablet Take 1 tablet (10 mg total) by mouth daily. 01/14/20  Yes Luking, Elayne Snare, MD  Vitamin D, Ergocalciferol, (DRISDOL) 1.25 MG (50000 UNIT) CAPS capsule Take 1 capsule (50,000 Units total) by mouth every 7 (seven) days. 01/14/20  Yes [name], MD  doxycycline (VIBRA-TABS) 100 MG tablet Take 1 tablet (100 mg total) by mouth 2 (two) times daily. Patient not taking: Reported on 01/26/2020 01/08/20   Mikey Kirschner, MD  Oxycodone HCl 10 MG TABS Take one tablet po q 4 hrs prn pain. Max 6 per day Patient not taking: Reported on 01/26/2020 11/17/19   [name], MD  Oxycodone HCl 10 MG TABS Take one tablet po q 4 hrs prn pain. Max 6 per day Patient not taking: Reported on 01/26/2020 11/17/19   [name], MD    Allergies    Bee venom, Bactrim [sulfamethoxazole-trimethoprim], Codeine, Contrast media [iodinated diagnostic agents], and Morphine and related  Review of Systems   Review of Systems  Constitutional: Positive for fatigue. Negative for chills and fever.  HENT: Negative for congestion.   Eyes: Negative for visual disturbance.  Respiratory: Negative for cough and shortness of breath.   Cardiovascular: Negative for chest pain.  Gastrointestinal: Positive for abdominal pain, nausea and vomiting.  Genitourinary: Negative for dysuria.  Neurological: Positive for weakness  (Generalized, body wide. ). Negative for headaches.  All other systems reviewed and are negative.   Physical Exam Updated Vital Signs BP (!) 114/56   Pulse (!) 59   Temp 97.6 F (36.4 C) (Temporal)   Resp 19   Ht 5\' 10"  (1.778 m)   Wt 90.7 kg   LMP 05/17/2016   SpO2 90%   BMI 28.70 kg/m   Physical Exam Vitals and nursing note reviewed.  Constitutional:      Appearance: She is well-developed. She is obese. She is ill-appearing. She is not diaphoretic.  HENT:     Head: Normocephalic and atraumatic.  Eyes:     General: No scleral icterus.       Right eye: No discharge.        Left eye: No discharge.     Conjunctiva/sclera: Conjunctivae normal.  Cardiovascular:     Rate and Rhythm: Normal rate and regular rhythm.     Pulses: Normal pulses.     Heart sounds: Normal heart sounds.  Pulmonary:     Effort: Pulmonary effort is normal. No respiratory distress.     Breath sounds: No stridor.  Abdominal:     General: Bowel sounds are normal. There is no distension.     Palpations: Abdomen is soft.     Tenderness: There is abdominal tenderness (RLQ).  Musculoskeletal:        General: No deformity.     Cervical back: Normal range of motion and neck supple.  Skin:    General: Skin is warm and dry.  Neurological:     General: No focal deficit present.     Mental Status: She is alert and oriented to person, place, and time.     Motor: No abnormal muscle tone.  Psychiatric:        Mood and Affect: Mood normal.        Behavior: Behavior normal.     ED Results / Procedures / Treatments   Labs (all labs ordered are listed, but only abnormal results are displayed) Labs Reviewed  SARS CORONAVIRUS 2 BY RT PCR ([facility])  LIPASE, BLOOD  COMPREHENSIVE METABOLIC PANEL  CBC  URINALYSIS, ROUTINE W REFLEX MICROSCOPIC    EKG None  Radiology CT ABDOMEN PELVIS WO CONTRAST  Result Date: 01/26/2020 CLINICAL DATA:  Nausea and vomiting for  2 days with abdominal pain, initial encounter EXAM: CT ABDOMEN AND PELVIS WITHOUT CONTRAST TECHNIQUE: Multidetector CT imaging of the abdomen and pelvis was performed following the standard protocol without IV contrast. COMPARISON:  05/06/2017 FINDINGS: Lower chest: Mild emphysematous changes are noted. No focal infiltrate or sizable effusion is seen. Hepatobiliary: No focal liver abnormality is seen. No gallstones, gallbladder wall thickening, or biliary dilatation. Pancreas: Unremarkable. No pancreatic ductal dilatation or surrounding inflammatory changes. Spleen: Normal in size without focal abnormality. Adrenals/Urinary Tract: Adrenal glands are unremarkable. Kidneys demonstrate no renal calculi or obstructive changes. The ureters are within normal limits. The bladder is well distended. Stomach/Bowel: Mild fecal material is noted within the colon. No obstructive or inflammatory changes of the colon are seen. The appendix is within normal limits. Small bowel and stomach are within normal limits. Vascular/Lymphatic: Aortic atherosclerosis. No enlarged abdominal or pelvic lymph nodes. Reproductive: Uterus and bilateral adnexa are unremarkable. Other: No abdominal wall hernia or abnormality. No abdominopelvic ascites. Musculoskeletal: Mild degenerative changes of the lumbar spine are noted. IMPRESSION: No acute abnormality noted to correspond with the patient's given clinical symptomatology. Electronically Signed   By: Inez Catalina M.D.   On: 01/26/2020 22:43    Procedures Procedures (including critical care time)  Medications Ordered in ED Medications  hydrocortisone sodium succinate (SOLU-CORTEF) 100 MG injection 100 mg (100 mg Intravenous Given 01/26/20 2202)  ondansetron (ZOFRAN) injection 4 mg (4 mg Intravenous Given 01/26/20 2257)  sodium chloride 0.9 % bolus 500 mL (0 mLs Intravenous Stopped 01/26/20 2339)  fentaNYL (SUBLIMAZE) injection 50 mcg (50 mcg Intravenous Given 01/26/20 2339)    ED Course  I  have reviewed the triage vital signs and the nursing notes.  Pertinent labs & imaging results that were available during my care of the patient were reviewed by me and considered in my medical decision making (see chart for details).    MDM Rules/Calculators/A&P                      Patient is a 59 year old woman with a past medical history significant for adrenal insufficiency/Addison's disease who presents today for evaluation of nausea, vomiting, and inability to tolerate p.o. intake for 2 days.  She has been unable to tolerate her p.o. steroids for 2 days.  She denies any fevers.  She feels this is consistent with a "flare" of her Addison's disease.  She does have mild left lower quadrant abdominal pain and tenderness.  She is chronically constipated due to her chronic opioid use with increased risk of diverticulitis.  CT scan was obtained, without contrast due to allergy, without evidence of diverticulitis or other cause for her abdominal pain and vomiting found.  CMP, CBC, lipase, UA and Covid testing all obtained and are unremarkable.  Gentle fluid hydration was given.  Due to her Addison's disease, with her inability to tolerate p.o. intake it does not appear to be safe for her to discharge home.  Patient will be admitted.  I spoke with hospitalist who is here for admission.  Note: Portions of this report may have been transcribed using voice recognition software. Every effort was made to ensure accuracy; however, inadvertent computerized transcription errors may be present  Final Clinical Impression(s) / ED Diagnoses Final diagnoses:  Nausea  and vomiting, intractability of vomiting not specified, unspecified vomiting type  Adrenal insufficiency (Addison's disease) [employer])    Rx / DC Orders ED Discharge Orders    None       Lorin Glass, PA-C 01/27/20 0011    Margette Fast, MD 01/27/20 [phone]

## 2020-01-26 NOTE — ED Triage Notes (Signed)
Pt states shes been having n/v x2 days. Pt called pcp today and was told to come to ED to be seen. Pt states she has adrenal insuffiencey and hasnt been able to keep meds down.

## 2020-01-27 DIAGNOSIS — T402X5A Adverse effect of other opioids, initial encounter: Secondary | ICD-10-CM | POA: Diagnosis present

## 2020-01-27 DIAGNOSIS — R112 Nausea with vomiting, unspecified: Secondary | ICD-10-CM | POA: Diagnosis present

## 2020-01-27 DIAGNOSIS — Z79899 Other long term (current) drug therapy: Secondary | ICD-10-CM | POA: Diagnosis not present

## 2020-01-27 DIAGNOSIS — J45909 Unspecified asthma, uncomplicated: Secondary | ICD-10-CM | POA: Diagnosis present

## 2020-01-27 DIAGNOSIS — Z79890 Hormone replacement therapy (postmenopausal): Secondary | ICD-10-CM | POA: Diagnosis not present

## 2020-01-27 DIAGNOSIS — Z7952 Long term (current) use of systemic steroids: Secondary | ICD-10-CM | POA: Diagnosis not present

## 2020-01-27 DIAGNOSIS — E271 Primary adrenocortical insufficiency: Secondary | ICD-10-CM | POA: Diagnosis present

## 2020-01-27 DIAGNOSIS — K219 Gastro-esophageal reflux disease without esophagitis: Secondary | ICD-10-CM | POA: Diagnosis present

## 2020-01-27 DIAGNOSIS — M797 Fibromyalgia: Secondary | ICD-10-CM | POA: Diagnosis present

## 2020-01-27 DIAGNOSIS — E039 Hypothyroidism, unspecified: Secondary | ICD-10-CM | POA: Diagnosis present

## 2020-01-27 DIAGNOSIS — K5903 Drug induced constipation: Secondary | ICD-10-CM | POA: Diagnosis present

## 2020-01-27 DIAGNOSIS — E2740 Unspecified adrenocortical insufficiency: Secondary | ICD-10-CM | POA: Diagnosis present

## 2020-01-27 DIAGNOSIS — G629 Polyneuropathy, unspecified: Secondary | ICD-10-CM | POA: Diagnosis present

## 2020-01-27 DIAGNOSIS — Z20822 Contact with and (suspected) exposure to covid-19: Secondary | ICD-10-CM | POA: Diagnosis present

## 2020-01-27 DIAGNOSIS — I959 Hypotension, unspecified: Secondary | ICD-10-CM | POA: Diagnosis present

## 2020-01-27 DIAGNOSIS — I10 Essential (primary) hypertension: Secondary | ICD-10-CM | POA: Diagnosis present

## 2020-01-27 DIAGNOSIS — K529 Noninfective gastroenteritis and colitis, unspecified: Secondary | ICD-10-CM | POA: Diagnosis present

## 2020-01-27 LAB — COMPREHENSIVE METABOLIC PANEL
ALT: 17 U/L (ref 0–44)
AST: 18 U/L (ref 15–41)
Albumin: 3.5 g/dL (ref 3.5–5.0)
Alkaline Phosphatase: 67 U/L (ref 38–126)
Anion gap: 7 (ref 5–15)
BUN: 9 mg/dL (ref 6–20)
CO2: 28 mmol/L (ref 22–32)
Calcium: 9.3 mg/dL (ref 8.9–10.3)
Chloride: 107 mmol/L (ref 98–111)
Creatinine, Ser: 0.74 mg/dL (ref 0.44–1.00)
GFR calc Af Amer: >60 mL/min (ref >60)
GFR calc non Af Amer: >60 mL/min (ref >60)
Glucose, Bld: 118 mg/dL — ABNORMAL HIGH (ref 70–99)
Potassium: 4.4 mmol/L (ref 3.5–5.1)
Sodium: 142 mmol/L (ref 135–145)
Total Bilirubin: 0.5 mg/dL (ref 0.3–1.2)
Total Protein: 6.7 g/dL (ref 6.5–8.1)

## 2020-01-27 LAB — CBC
HCT: 37 % (ref 36.0–46.0)
Hemoglobin: 12 g/dL (ref 12.0–15.0)
MCH: 29.1 pg (ref 26.0–34.0)
MCHC: 32.4 g/dL (ref 30.0–36.0)
MCV: 89.8 fL (ref 80.0–100.0)
Platelets: 326 10*3/uL (ref 150–400)
RBC: 4.12 MIL/uL (ref 3.87–5.11)
RDW: 14.1 % (ref 11.5–15.5)
WBC: 8.4 10*3/uL (ref 4.0–10.5)
nRBC: 0 % (ref 0.0–0.2)

## 2020-01-27 LAB — HIV ANTIBODY (ROUTINE TESTING W REFLEX): HIV Screen 4th Generation wRfx: NONREACTIVE

## 2020-01-27 MED ORDER — MIDODRINE HCL 5 MG PO TABS
5.0000 mg | ORAL_TABLET | Freq: Two times a day (BID) | ORAL | Status: DC
  Administered 2020-01-27 – 2020-01-28 (×2): 5 mg via ORAL
  Filled 2020-01-27 (×7): qty 1

## 2020-01-27 MED ORDER — ENOXAPARIN SODIUM 40 MG/0.4ML ~~LOC~~ SOLN
40.0000 mg | Freq: Every day | SUBCUTANEOUS | Status: DC
  Administered 2020-01-27 – 2020-01-28 (×2): 40 mg via SUBCUTANEOUS
  Filled 2020-01-27 (×2): qty 0.4

## 2020-01-27 MED ORDER — ONDANSETRON HCL 4 MG PO TABS: 4.0000 mg | ORAL_TABLET | Freq: Four times a day (QID) | ORAL | Status: DC | PRN

## 2020-01-27 MED ORDER — POLYETHYLENE GLYCOL 3350 17 G PO PACK
17.0000 g | PACK | Freq: Two times a day (BID) | ORAL | Status: DC | PRN
  Administered 2020-01-27 – 2020-01-28 (×2): 17 g via ORAL
  Filled 2020-01-27 (×2): qty 1

## 2020-01-27 MED ORDER — FERROUS SULFATE 325 (65 FE) MG PO TABS
325.0000 mg | ORAL_TABLET | Freq: Every day | ORAL | Status: DC
  Administered 2020-01-27 – 2020-01-28 (×2): 325 mg via ORAL
  Filled 2020-01-27 (×2): qty 1

## 2020-01-27 MED ORDER — ONDANSETRON HCL 4 MG/2ML IJ SOLN
4.0000 mg | Freq: Four times a day (QID) | INTRAMUSCULAR | Status: DC | PRN
  Administered 2020-01-27: 4 mg via INTRAVENOUS
  Filled 2020-01-27: qty 2

## 2020-01-27 MED ORDER — LINACLOTIDE 145 MCG PO CAPS
145.0000 ug | ORAL_CAPSULE | Freq: Every day | ORAL | Status: DC
  Administered 2020-01-27 – 2020-01-28 (×2): 145 ug via ORAL
  Filled 2020-01-27 (×4): qty 1

## 2020-01-27 MED ORDER — PANTOPRAZOLE SODIUM 40 MG PO TBEC
40.0000 mg | DELAYED_RELEASE_TABLET | Freq: Every day | ORAL | Status: DC
  Administered 2020-01-27 – 2020-01-28 (×2): 40 mg via ORAL
  Filled 2020-01-27 (×2): qty 1

## 2020-01-27 MED ORDER — ACETAMINOPHEN 650 MG RE SUPP: 650.0000 mg | Freq: Four times a day (QID) | RECTAL | Status: DC | PRN

## 2020-01-27 MED ORDER — ACETAMINOPHEN 325 MG PO TABS
650.0000 mg | ORAL_TABLET | Freq: Four times a day (QID) | ORAL | Status: DC | PRN
  Administered 2020-01-27: 650 mg via ORAL
  Filled 2020-01-27: qty 2

## 2020-01-27 MED ORDER — SENNOSIDES-DOCUSATE SODIUM 8.6-50 MG PO TABS
1.0000 | ORAL_TABLET | Freq: Two times a day (BID) | ORAL | Status: DC | PRN
  Administered 2020-01-27: 1 via ORAL
  Filled 2020-01-27: qty 1

## 2020-01-27 MED ORDER — HYDROCORTISONE 20 MG PO TABS
20.0000 mg | ORAL_TABLET | Freq: Three times a day (TID) | ORAL | Status: DC
  Administered 2020-01-28: 20 mg via ORAL
  Filled 2020-01-27 (×4): qty 1

## 2020-01-27 MED ORDER — LEVOTHYROXINE SODIUM 75 MCG PO TABS
75.0000 ug | ORAL_TABLET | Freq: Every day | ORAL | Status: DC
  Administered 2020-01-27 – 2020-01-28 (×2): 75 ug via ORAL
  Filled 2020-01-27: qty 2
  Filled 2020-01-27: qty 1

## 2020-01-27 MED ORDER — DOCUSATE SODIUM 100 MG PO CAPS
100.0000 mg | ORAL_CAPSULE | Freq: Two times a day (BID) | ORAL | Status: DC
  Administered 2020-01-27 – 2020-01-28 (×3): 100 mg via ORAL
  Filled 2020-01-27 (×3): qty 1

## 2020-01-27 MED ORDER — HYDROCORTISONE NA SUCCINATE PF 100 MG IJ SOLR
50.0000 mg | Freq: Four times a day (QID) | INTRAMUSCULAR | Status: AC
  Administered 2020-01-27 – 2020-01-28 (×4): 50 mg via INTRAVENOUS
  Filled 2020-01-27 (×4): qty 2

## 2020-01-27 MED ORDER — OXYCODONE HCL 5 MG PO TABS
10.0000 mg | ORAL_TABLET | ORAL | Status: DC
  Administered 2020-01-27 – 2020-01-28 (×7): 10 mg via ORAL
  Filled 2020-01-27 (×7): qty 2

## 2020-01-27 MED ORDER — SODIUM CHLORIDE 0.9 % IV SOLN: INTRAVENOUS | Status: DC

## 2020-01-27 NOTE — ED Notes (Signed)
Pt given as requested cereal bar, fruit cup and OJ.

## 2020-01-27 NOTE — Progress Notes (Deleted)
Pt. now wanting her midodrine, passed on to night shift nurse.

## 2020-01-27 NOTE — Progress Notes (Signed)
PROGRESS NOTE    [name]  [id number] DOB: 1961/08/13 DOA: 01/26/2020 PCP: [name], MD      Brief Narrative:  Kathleen [name] is a 59 y.o. F with Addison's disease hypothyroidism who presents with nausea and vomiting.  Patient had had several days inability to tolerate anything by mouth, vomiting all her medicines.  She could not keep down her steroids for 2 days so she called her endocrinologist who recommended she come to the ER.  In the ER, blood pressure soft but near baseline.  CT abdomen and pelvis normal.  She was started on IV steroids and admitted.       Assessment & Plan:  Nausea and vomiting No radiographic or laboratory findings to explain her vomiting.  Presumably this is a gastroenteritis, self-limited unfortunately is precluding her taking her life-sustaining steroids. -Advance diet as tolerated   Addison's disease -Continue IV steroids  Asthma -Hold prednisone  Hypothyroidism -Continue levothyroxine  Hypertension This is a chronic problem, she is on midodrine -Continue midodrine  IBS -Continue Linzess, PPI      Disposition: The patient has attempted oral intake twice today, but each time is severely ill afterwards.  It is my medical judgment that she may not be able to tolerate oral intake and that it is unsafe to discahrge her at this time.  I recommend continued IV steroids, and attempts to advance diet tomorrow.        MDM: This is a no charge note.  For further details, please see H&P by my partner Dr. Darrick Meigs from earlier today.  The below labs and imaging reports were reviewed and summarized above.    DVT prophylaxis:  Code Status:  Family Communication:     Consultants:     Procedures:     Antimicrobials:      Culture data:              Subjective: Patient feels sevely nauseated.        Objective: Vitals:   01/27/20 1045 01/27/20 1100 01/27/20 1115 01/27/20 1130  BP:  (!) 107/56     Pulse: 67 (!) 51 (!) 59 (!) 59  Resp:      Temp:      TempSrc:      SpO2: 92% 94% 93% 93%  Weight:      Height:       No intake or output data in the 24 hours ending 01/27/20 1513 Filed Weights   01/26/20 1920  Weight: 90.7 kg    Examination: The patient was seen and examined.      Data Reviewed: I have personally reviewed following labs and imaging studies:  CBC: Recent Labs  Lab 01/26/20 2105 01/27/20 0400  WBC 8.9 8.4  HGB 13.0 12.0  HCT 40.0 37.0  MCV 89.3 89.8  PLT 371 [id number]   Basic Metabolic Panel: Recent Labs  Lab 01/26/20 2105 01/27/20 0400  NA 137 142  K 3.7 4.4  CL 101 107  CO2 25 28  GLUCOSE 91 118*  BUN 9 9  CREATININE 0.76 0.74  CALCIUM 9.3 9.3   GFR: Estimated Creatinine Clearance: 94.8 mL/min (by C-G formula based on SCr of 0.74 mg/dL). Liver Function Tests: Recent Labs  Lab 01/26/20 2105 01/27/20 0400  AST 17 18  ALT 17 17  ALKPHOS 73 67  BILITOT 0.8 0.5  PROT 7.4 6.7  ALBUMIN 4.1 3.5   Recent Labs  Lab 01/26/20 2105  LIPASE 27   No results for input(s):  AMMONIA in the last 168 hours. Coagulation Profile: No results for input(s): INR, PROTIME in the last 168 hours. Cardiac Enzymes: No results for input(s): CKTOTAL, CKMB, CKMBINDEX, TROPONINI in the last 168 hours. BNP (last 3 results) No results for input(s): PROBNP in the last 8760 hours. HbA1C: No results for input(s): HGBA1C in the last 72 hours. CBG: No results for input(s): GLUCAP in the last 168 hours. Lipid Profile: No results for input(s): CHOL, HDL, LDLCALC, TRIG, CHOLHDL, LDLDIRECT in the last 72 hours. Thyroid Function Tests: No results for input(s): TSH, T4TOTAL, FREET4, T3FREE, THYROIDAB in the last 72 hours. Anemia Panel: No results for input(s): VITAMINB12, FOLATE, FERRITIN, TIBC, IRON, RETICCTPCT in the last 72 hours. Urine analysis:    Component Value Date/Time   COLORURINE YELLOW 01/26/2020 2000   APPEARANCEUR CLEAR 01/26/2020 2000   LABSPEC 1.008  01/26/2020 2000   PHURINE 7.0 01/26/2020 2000   GLUCOSEU NEGATIVE 01/26/2020 2000   GLUCOSEU NEGATIVE 05/29/2010 1622   HGBUR NEGATIVE 01/26/2020 2000   BILIRUBINUR NEGATIVE 01/26/2020 2000   BILIRUBINUR Positive 08/16/2015 0928   KETONESUR NEGATIVE 01/26/2020 2000   PROTEINUR NEGATIVE 01/26/2020 2000   UROBILINOGEN 0.2 06/16/2014 0942   NITRITE NEGATIVE 01/26/2020 2000   LEUKOCYTESUR NEGATIVE 01/26/2020 2000   Sepsis Labs: @LABRCNTIP (procalcitonin:4,lacticacidven:4)  ) Recent Results (from the past 240 hour(s))  SARS Coronavirus 2 by RT PCR (hospital order, performed in [place] hospital lab) Nasopharyngeal Nasopharyngeal Swab     Status: None   Collection Time: 01/26/20  9:53 PM   Specimen: Nasopharyngeal Swab  Result Value Ref Range Status   SARS Coronavirus 2 NEGATIVE NEGATIVE Final    Comment: (NOTE) SARS-CoV-2 target nucleic acids are NOT DETECTED. The SARS-CoV-2 RNA is generally detectable in upper and lower respiratory specimens during the acute phase of infection. The lowest concentration of SARS-CoV-2 viral copies this assay can detect is 250 copies / mL. A negative result does not preclude SARS-CoV-2 infection and should not be used as the sole basis for treatment or other patient management decisions.  A negative result may occur with improper specimen collection / handling, submission of specimen other than nasopharyngeal swab, presence of viral mutation(s) within the areas targeted by this assay, and inadequate number of viral copies (<250 copies / mL). A negative result must be combined with clinical observations, patient history, and epidemiological information. Fact Sheet for Patients:   StrictlyIdeas.no Fact Sheet for Healthcare Providers: BankingDealers.co.za This test is not yet approved or cleared  by the [place] FDA and has been authorized for detection and/or diagnosis of SARS-CoV-2 by FDA under an  Emergency Use Authorization (EUA).  This EUA will remain in effect (meaning this test can be used) for the duration of the COVID-19 declaration under Section 564(b)(1) of the Act, 21 U.S.C. section 360bbb-3(b)(1), unless the authorization is terminated or revoked sooner. Performed at [facility], [address] [place], [place] [address]          Radiology Studies: CT ABDOMEN PELVIS WO CONTRAST  Result Date: 01/26/2020 CLINICAL DATA:  Nausea and vomiting for 2 days with abdominal pain, initial encounter EXAM: CT ABDOMEN AND PELVIS WITHOUT CONTRAST TECHNIQUE: Multidetector CT imaging of the abdomen and pelvis was performed following the standard protocol without IV contrast. COMPARISON:  05/06/2017 FINDINGS: Lower chest: Mild emphysematous changes are noted. No focal infiltrate or sizable effusion is seen. Hepatobiliary: No focal liver abnormality is seen. No gallstones, gallbladder wall thickening, or biliary dilatation. Pancreas: Unremarkable. No pancreatic ductal dilatation or surrounding inflammatory  changes. Spleen: Normal in size without focal abnormality. Adrenals/Urinary Tract: Adrenal glands are unremarkable. Kidneys demonstrate no renal calculi or obstructive changes. The ureters are within normal limits. The bladder is well distended. Stomach/Bowel: Mild fecal material is noted within the colon. No obstructive or inflammatory changes of the colon are seen. The appendix is within normal limits. Small bowel and stomach are within normal limits. Vascular/Lymphatic: Aortic atherosclerosis. No enlarged abdominal or pelvic lymph nodes. Reproductive: Uterus and bilateral adnexa are unremarkable. Other: No abdominal wall hernia or abnormality. No abdominopelvic ascites. Musculoskeletal: Mild degenerative changes of the lumbar spine are noted. IMPRESSION: No acute abnormality noted to correspond with the patient's given clinical symptomatology. Electronically Signed   By: Inez Catalina M.D.   On:  01/26/2020 22:43        Scheduled Meds: . docusate sodium  100 mg Oral BID  . enoxaparin (LOVENOX) injection  40 mg Subcutaneous Daily  . ferrous sulfate  325 mg Oral Q breakfast  . hydrocortisone sod succinate (SOLU-CORTEF) inj  50 mg Intravenous Q6H  . levothyroxine  75 mcg Oral QAC breakfast  . linaclotide  145 mcg Oral QAC breakfast  . midodrine  5 mg Oral BID WC  . oxyCODONE  10 mg Oral Q4H  . pantoprazole  40 mg Oral Daily   Continuous Infusions: . sodium chloride 100 mL/hr at 01/27/20 0617     LOS: 0 days    Time spent: 15 minuets    Edwin Dada, MD [facility] 01/27/2020, 3:13 PM     Please page though [place] or Epic secure chat:  For password, contact charge nurse

## 2020-01-27 NOTE — ED Notes (Signed)
Pt placed on 2LPM via [place] per request. Pt states she wears O2 at home when she sleeps.

## 2020-01-27 NOTE — H&P (Signed)
TRH H&P    Patient Demographics:    Kathleen [name], is a 59 y.o. female  MRN: [id number]  DOB - 1960-11-15  Admit Date - 01/26/2020  Referring MD/NP/PA: Nanda Quinton  Outpatient Primary MD for the patient is [name], MD  Patient coming from: Home  Chief complaint-nausea vomiting   HPI:    Kathleen [name]  is a 59 y.o. female, with medical history of sarcoidosis, fibromyalgia, avascular bone necrosis, Addison's disease/adrenal insufficiency, mitral valve prolapse who presented for evaluation of nausea, vomiting and inability to tolerate p.o. including steroids over the past 2 days.  Denies fever or chills.  Denies dysuria.  Has mild abdominal pain.  Patient called her endocrinologist who asked her to come to the ED for IV steroids. In the ED patient was given hydrocortisone 100 mg IV x1 She denies chest pain or shortness of breath. Denies blurred vision or passing out.    Review of systems:    In addition to the HPI above,    All other systems reviewed and are negative.    Past History of the following :    Past Medical History:  Diagnosis Date  . Adrenal insufficiency ([place])   . Anemia   . Avascular bone necrosis ([place])   . Breast fibrocystic disorder   . Chronic pain   . Fibromyalgia   . Gastroesophageal reflux disease   . Mitral valve prolapse   . Orthostatic hypotension   . Peripheral neuropathy   . Restrictive lung disease    Moderate to severe  . Sarcoidosis    Biopsy proven - [facility]  . SOB (shortness of breath)    chronic      Past Surgical History:  Procedure Laterality Date  . BIOPSY  10/17/2018   Procedure: BIOPSY;  Surgeon: Rogene Houston, MD;  [facility];  Service: Endoscopy;;  duodenum, antrum, gastric body  . BREAST LUMPECTOMY  01/12/2011   right  . ESOPHAGOGASTRODUODENOSCOPY (EGD) WITH PROPOFOL N/A 10/17/2018   Procedure: ESOPHAGOGASTRODUODENOSCOPY (EGD)  WITH PROPOFOL;  Surgeon: Rogene Houston, MD;  Location[facility];  Service: Endoscopy;  Laterality: N/A;  2:25  . TUBAL LIGATION        Social History:      Social History   Tobacco Use  . Smoking status: Never Smoker  . Smokeless tobacco: Never Used  Substance Use Topics  . Alcohol use: No       Family History :     Family History  Problem Relation Age of Onset  . Cardiomyopathy Mother   . Breast cancer Mother   . Prostate cancer Father   . Neurofibromatosis Brother   . Heart disease Maternal Aunt   . Bipolar disorder Daughter   . Colon cancer Neg Hx       Home Medications:   Prior to Admission medications   Medication Sig Start Date End Date Taking? Authorizing Provider  acetaminophen (TYLENOL) 500 MG tablet Take 1,000 mg by mouth every 8 (eight) hours as needed for headache.   Yes [provider]  docusate sodium (COLACE) 100 MG capsule Take 100 mg by mouth 2 (two) times daily.   Yes [provider]  EPINEPHrine 0.3 mg/0.3 mL IJ SOAJ injection Inject 0.3 mLs (0.3 mg total) into the muscle as needed for anaphylaxis. 09/15/19  Yes Luking, Elayne Snare, MD  ferrous sulfate 325 (65 FE) MG EC tablet Take 325 mg by mouth daily with breakfast.   Yes [provider]  hydrocortisone (CORTEF) 10 MG tablet TAKE 2 TABLETS BY MOUTH AT 8AM Patient taking differently: Take 20 mg by mouth in the morning.  11/25/19  Yes Nida, Marella Chimes, MD  levothyroxine (SYNTHROID) 75 MCG tablet TAKE 1 TABLET BY MOUTH BEFORE BREAKFAST EVERY DAY EXCEPT TAKE 1/2 TABLET 1 DAY A WEEK Patient taking differently: Take 75 mcg by mouth daily before breakfast.  12/04/19  Yes Nida, Marella Chimes, MD  linaclotide [name]) 145 MCG CAPS capsule Take 1 capsule (145 mcg total) by mouth daily before breakfast. 09/15/19  Yes Luking, Scott A, MD  midodrine (PROAMATINE) 5 MG tablet TAKE 1 TABLET BY MOUTH TWICE DAILY WITH A MEAL Patient taking differently: Take 5 mg by mouth in the  morning. TAKE 1 TABLET BY MOUTH TWICE DAILY WITH A MEAL 11/06/19  Yes Luking, Scott A, MD  ondansetron (ZOFRAN-ODT) 8 MG disintegrating tablet DISSOLVE 1 TABLET BY MOUTH EVERY 8 HOURS AS NEEDED FOR NAUSEA OR VOMITING Patient taking differently: Take 8 mg by mouth every 8 (eight) hours as needed for nausea or vomiting.  08/11/19  Yes Luking, Scott A, MD  Oxycodone HCl 10 MG TABS Take one tablet po q 4 hrs prn pain. Max 6 per day Patient taking differently: Take 10 mg by mouth every 4 (four) hours.  11/17/19  Yes Luking, Scott A, MD  pantoprazole (PROTONIX) 40 MG tablet TAKE 1 TABLET BY MOUTH ONCE DAILY Patient taking differently: Take 40 mg by mouth daily.  10/09/19  Yes Luking, Elayne Snare, MD  predniSONE (DELTASONE) 5 MG tablet Take 5 mg by mouth daily.  02/23/19 02/18/20 Yes [provider]  promethazine (PHENERGAN) 25 MG tablet TAKE 1 TABLET BY MOUTH EVERY 8 HOURS AS NEEDED FOR NAUSEA OR VOMITING Patient taking differently: Take 25 mg by mouth every 8 (eight) hours as needed for nausea or vomiting.  04/28/19  Yes [name], MD  rosuvastatin (CRESTOR) 10 MG tablet Take 1 tablet (10 mg total) by mouth daily. 01/14/20  Yes Luking, Elayne Snare, MD  Vitamin D, Ergocalciferol, (DRISDOL) 1.25 MG (50000 UNIT) CAPS capsule Take 1 capsule (50,000 Units total) by mouth every 7 (seven) days. 01/14/20  Yes [name], MD  doxycycline (VIBRA-TABS) 100 MG tablet Take 1 tablet (100 mg total) by mouth 2 (two) times daily. Patient not taking: Reported on 01/26/2020 01/08/20   Mikey Kirschner, MD  Oxycodone HCl 10 MG TABS Take one tablet po q 4 hrs prn pain. Max 6 per day Patient not taking: Reported on 01/26/2020 11/17/19   [name], MD  Oxycodone HCl 10 MG TABS Take one tablet po q 4 hrs prn pain. Max 6 per day Patient not taking: Reported on 01/26/2020 11/17/19   [name], MD     Allergies:     Allergies  Allergen Reactions  . Bee Venom Anaphylaxis  . Bactrim [Sulfamethoxazole-Trimethoprim]  Nausea And Vomiting  . Codeine Itching  . Contrast Media [Iodinated Diagnostic Agents]     CT chest w/ contrast at OSH followed by SOB resolved w/ single  dose steroids, never ENT swelling, intubation or pressors, has had multiple contrasted scans since  . Morphine And Related Other (See Comments)    Unknown reaction- pt gets sick, dizzy, and confused Intolerance, not an allergy, received this in the emergency department recently without a problem.     Physical Exam:   Vitals  Blood pressure 116/62, pulse (!) 58, temperature 97.6 F (36.4 C), temperature source Temporal, resp. rate 20, height 5\' 10"  (1.778 m), weight 90.7 kg, last menstrual period 05/17/2016, SpO2 97 %.  1.  General: Appears in no acute distress  2. Psychiatric: Alert, Oriented x3, intact insight and judgment  3. Neurologic: Cranial nerves II through XII grossly intact, no focal deficit noted  4. HEENMT:  Atraumatic normocephalic, extraocular muscles are intact  5. Respiratory : Clear to auscultation bilaterally  6. Cardiovascular : S1-S2, regular, no murmur auscultated  7. Gastrointestinal:  Abdominal soft, mild generalized tenderness to  Palpation, no organomegaly     Data Review:    CBC Recent Labs  Lab 01/26/20 2105  WBC 8.9  HGB 13.0  HCT 40.0  PLT 371  MCV 89.3  MCH 29.0  MCHC 32.5  RDW 14.3   ------------------------------------------------------------------------------------------------------------------  Results for orders placed or performed during the hospital encounter of 01/26/20 (from the past 48 hour(s))  Urinalysis, Routine w reflex microscopic     Status: None   Collection Time: 01/26/20  8:00 PM  Result Value Ref Range   Color, Urine YELLOW YELLOW   APPearance CLEAR CLEAR   Specific Gravity, Urine 1.008 1.005 - 1.030   pH 7.0 5.0 - 8.0   Glucose, UA NEGATIVE NEGATIVE mg/dL   Hgb urine dipstick NEGATIVE NEGATIVE   Bilirubin Urine NEGATIVE NEGATIVE   Ketones, ur  NEGATIVE NEGATIVE mg/dL   Protein, ur NEGATIVE NEGATIVE mg/dL   Nitrite NEGATIVE NEGATIVE   Leukocytes,Ua NEGATIVE NEGATIVE    Comment: Performed at [facility], [address] [place], [place] [address]  Lipase, blood     Status: None   Collection Time: 01/26/20  9:05 PM  Result Value Ref Range   Lipase 27 11 - 51 U/L    Comment: Performed at [facility], [address], [place], [place] [address]  Comprehensive metabolic panel     Status: None   Collection Time: 01/26/20  9:05 PM  Result Value Ref Range   Sodium 137 135 - 145 mmol/L   Potassium 3.7 3.5 - 5.1 mmol/L   Chloride 101 98 - 111 mmol/L   CO2 25 22 - 32 mmol/L   Glucose, Bld 91 70 - 99 mg/dL    Comment: Glucose reference range applies only to samples taken after fasting for at least 8 hours.   BUN 9 6 - 20 mg/dL   Creatinine, Ser 0.76 0.44 - 1.00 mg/dL   Calcium 9.3 8.9 - 10.3 mg/dL   Total Protein 7.4 6.5 - 8.1 g/dL   Albumin 4.1 3.5 - 5.0 g/dL   AST 17 15 - 41 U/L   ALT 17 0 - 44 U/L   Alkaline Phosphatase 73 38 - 126 U/L   Total Bilirubin 0.8 0.3 - 1.2 mg/dL   GFR calc non Af Amer >60 >60 mL/min   GFR calc Af Amer >60 >60 mL/min   Anion gap 11 5 - 15    Comment: Performed at [facility], [address], [place], [place] [address]  CBC     Status: None   Collection Time: 01/26/20  9:05 PM  Result  Value Ref Range   WBC 8.9 4.0 - 10.5 K/uL   RBC 4.48 3.87 - 5.11 MIL/uL   Hemoglobin 13.0 12.0 - 15.0 g/dL   HCT 40.0 36.0 - 46.0 %   MCV 89.3 80.0 - 100.0 fL   MCH 29.0 26.0 - 34.0 pg   MCHC 32.5 30.0 - 36.0 g/dL   RDW 14.3 11.5 - 15.5 %   Platelets 371 150 - 400 K/uL   nRBC 0.0 0.0 - 0.2 %    Comment: Performed at [facility], [address], [place], [place] [address]  SARS Coronavirus 2 by RT PCR (hospital order, performed in [facility] hospital lab) Nasopharyngeal Nasopharyngeal Swab     Status: None   Collection Time: 01/26/20  9:53 PM   Specimen: Nasopharyngeal Swab  Result Value Ref Range   SARS  Coronavirus 2 NEGATIVE NEGATIVE    Comment: (NOTE) SARS-CoV-2 target nucleic acids are NOT DETECTED. The SARS-CoV-2 RNA is generally detectable in upper and lower respiratory specimens during the acute phase of infection. The lowest concentration of SARS-CoV-2 viral copies this assay can detect is 250 copies / mL. A negative result does not preclude SARS-CoV-2 infection and should not be used as the sole basis for treatment or other patient management decisions.  A negative result may occur with improper specimen collection / handling, submission of specimen other than nasopharyngeal swab, presence of viral mutation(s) within the areas targeted by this assay, and inadequate number of viral copies (<250 copies / mL). A negative result must be combined with clinical observations, patient history, and epidemiological information. Fact Sheet for Patients:   StrictlyIdeas.no Fact Sheet for Healthcare Providers: BankingDealers.co.za This test is not yet approved or cleared  by the [place] FDA and has been authorized for detection and/or diagnosis of SARS-CoV-2 by FDA under an Emergency Use Authorization (EUA).  This EUA will remain in effect (meaning this test can be used) for the duration of the COVID-19 declaration under Section 564(b)(1) of the Act, 21 U.S.C. section 360bbb-3(b)(1), unless the authorization is terminated or revoked sooner. Performed at [facility], [address] [place], [place] [address]     Chemistries  Recent Labs  Lab 01/26/20 2105  NA 137  K 3.7  CL 101  CO2 25  GLUCOSE 91  BUN 9  CREATININE 0.76  CALCIUM 9.3  AST 17  ALT 17  ALKPHOS 73  BILITOT 0.8   ------------------------------------------------------------------------------------------------------------------  ------------------------------------------------------------------------------------------------------------------ GFR: Estimated  Creatinine Clearance: 94.8 mL/min (by C-G formula based on SCr of 0.76 mg/dL). Liver Function Tests: Recent Labs  Lab 01/26/20 2105  AST 17  ALT 17  ALKPHOS 73  BILITOT 0.8  PROT 7.4  ALBUMIN 4.1   Recent Labs  Lab 01/26/20 2105  LIPASE 27    --------------------------------------------------------------------------------------------------------------- Urine analysis:    Component Value Date/Time   COLORURINE YELLOW 01/26/2020 2000   APPEARANCEUR CLEAR 01/26/2020 2000   LABSPEC 1.008 01/26/2020 2000   PHURINE 7.0 01/26/2020 2000   GLUCOSEU NEGATIVE 01/26/2020 2000   GLUCOSEU NEGATIVE 05/29/2010 1622   HGBUR NEGATIVE 01/26/2020 2000   BILIRUBINUR NEGATIVE 01/26/2020 2000   BILIRUBINUR Positive 08/16/2015 0928   KETONESUR NEGATIVE 01/26/2020 2000   PROTEINUR NEGATIVE 01/26/2020 2000   UROBILINOGEN 0.2 06/16/2014 0942   NITRITE NEGATIVE 01/26/2020 2000   LEUKOCYTESUR NEGATIVE 01/26/2020 2000      Imaging Results:    CT ABDOMEN PELVIS WO CONTRAST  Result Date: 01/26/2020 CLINICAL DATA:  Nausea and vomiting for 2 days with abdominal pain,  initial encounter EXAM: CT ABDOMEN AND PELVIS WITHOUT CONTRAST TECHNIQUE: Multidetector CT imaging of the abdomen and pelvis was performed following the standard protocol without IV contrast. COMPARISON:  05/06/2017 FINDINGS: Lower chest: Mild emphysematous changes are noted. No focal infiltrate or sizable effusion is seen. Hepatobiliary: No focal liver abnormality is seen. No gallstones, gallbladder wall thickening, or biliary dilatation. Pancreas: Unremarkable. No pancreatic ductal dilatation or surrounding inflammatory changes. Spleen: Normal in size without focal abnormality. Adrenals/Urinary Tract: Adrenal glands are unremarkable. Kidneys demonstrate no renal calculi or obstructive changes. The ureters are within normal limits. The bladder is well distended. Stomach/Bowel: Mild fecal material is noted within the colon. No obstructive or  inflammatory changes of the colon are seen. The appendix is within normal limits. Small bowel and stomach are within normal limits. Vascular/Lymphatic: Aortic atherosclerosis. No enlarged abdominal or pelvic lymph nodes. Reproductive: Uterus and bilateral adnexa are unremarkable. Other: No abdominal wall hernia or abnormality. No abdominopelvic ascites. Musculoskeletal: Mild degenerative changes of the lumbar spine are noted. IMPRESSION: No acute abnormality noted to correspond with the patient's given clinical symptomatology. Electronically Signed   By: Inez Catalina M.D.   On: 01/26/2020 22:43    My personal review of EKG: Rhythm NSR, T wave inversions noted in multiple leads, unchanged from previous EKG from March 2019   Assessment & Plan:    Active Problems:   Acute adrenal insufficiency ([place])   1. Acute acute insufficiency-patient presenting with acute renal insufficiency, intractable nausea vomiting, unable to take p.o. hydrocortisone.  She was given hydrocortisone 100 mg IV in the ED, will start hydrocortisone 50 mg IV every 6 hours,.  Blood pressure stable.  Once patient is stable and able to take p.o., consider transitioning to p.o. hydrocortisone at her home dose. 2. Intractable nausea vomiting-we will keep patient n.p.o., start Zofran as needed for nausea vomiting.  Start IV normal saline at 100 mL/h. 3. Hypothyroidism-continue Synthroid    DVT Prophylaxis-   Lovenox  AM Labs Ordered, also please review Full Orders  Family Communication: Admission, patients condition and plan of care including tests being ordered have been discussed with the patient who indicate understanding and agree with the plan and Code Status.  Code Status: Full code  Admission status: Observation/Inpatient :The appropriate admission status for this patient is INPATIENT. Inpatient status is judged to be reasonable and necessary in order to provide the required intensity of service to ensure the patient's  safety. The patient's presenting symptoms, physical exam findings, and initial radiographic and laboratory data in the context of their chronic comorbidities is felt to place them at high risk for further clinical deterioration. Furthermore, it is not anticipated that the patient will be medically stable for discharge from the hospital within 2 midnights of admission. The following factors support the admission status of inpatient.     The patient's presenting symptoms include nausea and vomiting. The worrisome physical exam findings include hypotension. The initial radiographic and laboratory data are worrisome because of added insufficiency. The chronic co-morbidities include adrenal insufficiency.       * I certify that at the point of admission it is my clinical judgment that the patient will require inpatient hospital care spanning beyond 2 midnights from the point of admission due to high intensity of service, high risk for further deterioration and high frequency of surveillance required.*  Time spent in minutes : 60 minutes   Tunya Held S Trenise Turay M.D

## 2020-01-27 NOTE — ED Notes (Signed)
Pt had a pack of crackers, applesauce, and 2 OJ's with morning meds with no vomiting noted, verbalized some nausea but no need for zofran. Marland Kitchen

## 2020-01-27 NOTE — Progress Notes (Signed)
Pt asking for something to help her sleep at night.. Normally takes tylenol PMs at home. She's refusing her midodrine right now, says she normally only takes it once a day and that midodrine keeps her up (will take if we give her something for sleep). zofran also keeps her up at night. Myrene Buddy, MD notified.

## 2020-01-28 LAB — CBC
HCT: 34.2 % — ABNORMAL LOW (ref 36.0–46.0)
Hemoglobin: 10.8 g/dL — ABNORMAL LOW (ref 12.0–15.0)
MCH: 28.9 pg (ref 26.0–34.0)
MCHC: 31.6 g/dL (ref 30.0–36.0)
MCV: 91.4 fL (ref 80.0–100.0)
Platelets: 325 10*3/uL (ref 150–400)
RBC: 3.74 MIL/uL — ABNORMAL LOW (ref 3.87–5.11)
RDW: 14.1 % (ref 11.5–15.5)
WBC: 6.4 10*3/uL (ref 4.0–10.5)
nRBC: 0 % (ref 0.0–0.2)

## 2020-01-28 LAB — COMPREHENSIVE METABOLIC PANEL
ALT: 15 U/L (ref 0–44)
AST: 15 U/L (ref 15–41)
Albumin: 3 g/dL — ABNORMAL LOW (ref 3.5–5.0)
Alkaline Phosphatase: 55 U/L (ref 38–126)
Anion gap: 8 (ref 5–15)
BUN: 13 mg/dL (ref 6–20)
CO2: 26 mmol/L (ref 22–32)
Calcium: 8.8 mg/dL — ABNORMAL LOW (ref 8.9–10.3)
Chloride: 107 mmol/L (ref 98–111)
Creatinine, Ser: 0.76 mg/dL (ref 0.44–1.00)
GFR calc Af Amer: >60 mL/min (ref >60)
GFR calc non Af Amer: >60 mL/min (ref >60)
Glucose, Bld: 113 mg/dL — ABNORMAL HIGH (ref 70–99)
Potassium: 4.3 mmol/L (ref 3.5–5.1)
Sodium: 141 mmol/L (ref 135–145)
Total Bilirubin: 0.4 mg/dL (ref 0.3–1.2)
Total Protein: 6 g/dL — ABNORMAL LOW (ref 6.5–8.1)

## 2020-01-28 MED ORDER — ONDANSETRON HCL 4 MG PO TABS: 4.0000 mg | ORAL_TABLET | Freq: Every day | ORAL | 1 refills | Status: DC | PRN

## 2020-01-28 NOTE — Progress Notes (Signed)
AVS went over with patient. No questions or concerns. IV removed, catheter intact. Telemetry monitor removed.

## 2020-01-28 NOTE — TOC Progression Note (Signed)
Transition of Care [facility]) - Progression Note    Patient Details  Name: Kathleen [name] MRN: [id number] Date of Birth: 1960-12-16  Transition of Care [facility]) CM/SW Contact  Salome Arnt, [place] Phone Number: 01/28/2020, 8:30 AM  Clinical Narrative:   TOC received consult for home health/DME needs. Pt discharging home today and per MD has no needs.          Expected Discharge Plan and Services           Expected Discharge Date: 01/28/20                                     Social Determinants of Health (SDOH) Interventions    Readmission Risk Interventions No flowsheet data found.

## 2020-01-28 NOTE — Discharge Summary (Signed)
Physician Discharge Summary  Kathleen [name] [id number] DOB: October 05, 1960 DOA: 01/26/2020  PCP: [name], MD  Admit date: 01/26/2020 Discharge date: 01/28/2020  Admitted From: Home  Disposition:  Home   Recommendations for Outpatient Follow-up:  1. Follow up with PCP in 1-2 weeks    Home Health: None  Equipment/Devices: None  Discharge Condition: Good  CODE STATUS: FULL Diet recommendation: Soft  Brief/Interim Summary: Mrs. Lallo is a 59 y.o. F with Addison's disease hypothyroidism who presents with nausea and vomiting.  Patient had had several days inability to tolerate anything by mouth, vomiting all her medicines.  She could not keep down her steroids for 2 days so she called her endocrinologist who recommended she come to the ER.  In the ER, blood pressure soft but near baseline.  CT abdomen and pelvis normal.  She was started on IV steroids and admitted.      PRINCIPAL HOSPITAL DIAGNOSIS: Nausea and vomiting    Discharge Diagnoses:   Nausea and vomiting No radiographic or laboratory findings to explain her vomiting.  Presumably this is a gastroenteritis, self-limited.    This was resolving and patient able to tolerate solid food today as well as morning meds without difficulty.     Addison's disease Patient started on stress dose steroids at admission.  Clinically she improved and her vital signs and mentation were good.  Resume steroids at discharge with one more day stress dosing by mouth.  Asthma No flare.  Hypothyroidism Continue levothyroxine  Hypotension This is a chronic problem, she is on midodrine  IBS with constipation Continue Linzess, PPI             Discharge Instructions  Discharge Instructions    Discharge instructions   Complete by: As directed    From Dr. Loleta Books: You were admitted for IV steroids while you were unable to take them by mouth.  For now, as you recover, take it easy. Stick to foods that are  easy on the stomach: bananas, rice, toast, etc. Push fluids  For the next two days: Take hydrocortisone 20 mg THREE TIMES DAILY with each meal Then Saturday resume your normal home dose of once daily  Resume all your other home medicines  Take ondansetron/Zofran 4mg  for nausea, this is the same nausea medicine you were taking here.   Increase activity slowly   Complete by: As directed      Allergies as of 01/28/2020      Reactions   Bee Venom Anaphylaxis   Bactrim [sulfamethoxazole-trimethoprim] Nausea And Vomiting   Codeine Itching   Contrast Media [iodinated Diagnostic Agents]    CT chest w/ contrast at OSH followed by SOB resolved w/ single dose steroids, never ENT swelling, intubation or pressors, has had multiple contrasted scans since   Morphine And Related Other (See Comments)   Unknown reaction- pt gets sick, dizzy, and confused Intolerance, not an allergy, received this in the emergency department recently without a problem.      Medication List    STOP taking these medications   doxycycline 100 MG tablet Commonly known as: VIBRA-TABS     TAKE these medications   acetaminophen 500 MG tablet Commonly known as: TYLENOL Take 1,000 mg by mouth every 8 (eight) hours as needed for headache.   docusate sodium 100 MG capsule Commonly known as: COLACE Take 100 mg by mouth 2 (two) times daily.   EPINEPHrine 0.3 mg/0.3 mL Soaj injection Commonly known as: EPI-PEN Inject 0.3 mLs (0.3 mg total)  into the muscle as needed for anaphylaxis.   ferrous sulfate 325 (65 FE) MG EC tablet Take 325 mg by mouth daily with breakfast.   hydrocortisone 10 MG tablet Commonly known as: CORTEF TAKE 2 TABLETS BY MOUTH AT 8AM What changed:   how much to take  how to take this  when to take this  additional instructions   levothyroxine 75 MCG tablet Commonly known as: SYNTHROID TAKE 1 TABLET BY MOUTH BEFORE BREAKFAST EVERY DAY EXCEPT TAKE 1/2 TABLET 1 DAY A WEEK What changed:    how much to take  how to take this  when to take this  additional instructions   linaclotide 145 MCG Caps capsule Commonly known as: Linzess Take 1 capsule (145 mcg total) by mouth daily before breakfast.   midodrine 5 MG tablet Commonly known as: PROAMATINE TAKE 1 TABLET BY MOUTH TWICE DAILY WITH A MEAL What changed: See the new instructions.   ondansetron 4 MG tablet Commonly known as: Zofran Take 1 tablet (4 mg total) by mouth daily as needed for nausea or vomiting.   ondansetron 8 MG disintegrating tablet Commonly known as: ZOFRAN-ODT DISSOLVE 1 TABLET BY MOUTH EVERY 8 HOURS AS NEEDED FOR NAUSEA OR VOMITING What changed: See the new instructions.   Oxycodone HCl 10 MG Tabs Take one tablet po q 4 hrs prn pain. Max 6 per day What changed:   how much to take  how to take this  when to take this  additional instructions  Another medication with the same name was removed. Continue taking this medication, and follow the directions you see here.   pantoprazole 40 MG tablet Commonly known as: PROTONIX TAKE 1 TABLET BY MOUTH ONCE DAILY   predniSONE 5 MG tablet Commonly known as: DELTASONE Take 5 mg by mouth daily.   promethazine 25 MG tablet Commonly known as: PHENERGAN TAKE 1 TABLET BY MOUTH EVERY 8 HOURS AS NEEDED FOR NAUSEA OR VOMITING What changed: See the new instructions.   rosuvastatin 10 MG tablet Commonly known as: Crestor Take 1 tablet (10 mg total) by mouth daily.   Vitamin D (Ergocalciferol) 1.25 MG (50000 UNIT) Caps capsule Commonly known as: DRISDOL Take 1 capsule (50,000 Units total) by mouth every 7 (seven) days.       Allergies  Allergen Reactions  . Bee Venom Anaphylaxis  . Bactrim [Sulfamethoxazole-Trimethoprim] Nausea And Vomiting  . Codeine Itching  . Contrast Media [Iodinated Diagnostic Agents]     CT chest w/ contrast at OSH followed by SOB resolved w/ single dose steroids, never ENT swelling, intubation or pressors, has had  multiple contrasted scans since  . Morphine And Related Other (See Comments)    Unknown reaction- pt gets sick, dizzy, and confused Intolerance, not an allergy, received this in the emergency department recently without a problem.    Consultations:     Procedures/Studies: CT ABDOMEN PELVIS WO CONTRAST  Result Date: 01/26/2020 CLINICAL DATA:  Nausea and vomiting for 2 days with abdominal pain, initial encounter EXAM: CT ABDOMEN AND PELVIS WITHOUT CONTRAST TECHNIQUE: Multidetector CT imaging of the abdomen and pelvis was performed following the standard protocol without IV contrast. COMPARISON:  05/06/2017 FINDINGS: Lower chest: Mild emphysematous changes are noted. No focal infiltrate or sizable effusion is seen. Hepatobiliary: No focal liver abnormality is seen. No gallstones, gallbladder wall thickening, or biliary dilatation. Pancreas: Unremarkable. No pancreatic ductal dilatation or surrounding inflammatory changes. Spleen: Normal in size without focal abnormality. Adrenals/Urinary Tract: Adrenal glands are unremarkable. Kidneys demonstrate no  renal calculi or obstructive changes. The ureters are within normal limits. The bladder is well distended. Stomach/Bowel: Mild fecal material is noted within the colon. No obstructive or inflammatory changes of the colon are seen. The appendix is within normal limits. Small bowel and stomach are within normal limits. Vascular/Lymphatic: Aortic atherosclerosis. No enlarged abdominal or pelvic lymph nodes. Reproductive: Uterus and bilateral adnexa are unremarkable. Other: No abdominal wall hernia or abnormality. No abdominopelvic ascites. Musculoskeletal: Mild degenerative changes of the lumbar spine are noted. IMPRESSION: No acute abnormality noted to correspond with the patient's given clinical symptomatology. Electronically Signed   By: Inez Catalina M.D.   On: 01/26/2020 22:43       Subjective: Still mild nausea after eating, but able to tolerate good  PO intake and fluids without vomiting.  No fever.  No diarrhea.  No melena.  Discharge Exam: Vitals:   01/28/20 0000 01/28/20 0320  BP: 95/61 101/61  Pulse: 70 (!) 59  Resp:  18  Temp:  98.1 F (36.7 C)  SpO2:  98%   Vitals:   01/27/20 1930 01/27/20 2330 01/28/20 0000 01/28/20 0320  BP: 118/66 (!) 104/42 95/61 101/61  Pulse: 66 77 70 (!) 59  Resp: 20 20  18   Temp: 97.9 F (36.6 C) 98.2 F (36.8 C)  98.1 F (36.7 C)  TempSrc: Oral Oral  Oral  SpO2: 97% 98%  98%  Weight:      Height:        General: Pt is alert, awake, not in acute distress Cardiovascular: RRR, nl S1-S2, no murmurs appreciated.   No LE edema.   Respiratory: Normal respiratory rate and rhythm.  CTAB without rales or wheezes. Abdominal: Abdomen soft and non-tender.  No distension or HSM.   Neuro/Psych: Strength symmetric in upper and lower extremities.  Judgment and insight appear normal.   The results of significant diagnostics from this hospitalization (including imaging, microbiology, ancillary and laboratory) are listed below for reference.     Microbiology: Recent Results (from the past 240 hour(s))  SARS Coronavirus 2 by RT PCR (hospital order, performed in [facility] hospital lab) Nasopharyngeal Nasopharyngeal Swab     Status: None   Collection Time: 01/26/20  9:53 PM   Specimen: Nasopharyngeal Swab  Result Value Ref Range Status   SARS Coronavirus 2 NEGATIVE NEGATIVE Final    Comment: (NOTE) SARS-CoV-2 target nucleic acids are NOT DETECTED. The SARS-CoV-2 RNA is generally detectable in upper and lower respiratory specimens during the acute phase of infection. The lowest concentration of SARS-CoV-2 viral copies this assay can detect is 250 copies / mL. A negative result does not preclude SARS-CoV-2 infection and should not be used as the sole basis for treatment or other patient management decisions.  A negative result may occur with improper specimen collection / handling, submission of  specimen other than nasopharyngeal swab, presence of viral mutation(s) within the areas targeted by this assay, and inadequate number of viral copies (<250 copies / mL). A negative result must be combined with clinical observations, patient history, and epidemiological information. Fact Sheet for Patients:   StrictlyIdeas.no Fact Sheet for Healthcare Providers: BankingDealers.co.za This test is not yet approved or cleared  by the [place] FDA and has been authorized for detection and/or diagnosis of SARS-CoV-2 by FDA under an Emergency Use Authorization (EUA).  This EUA will remain in effect (meaning this test can be used) for the duration of the COVID-19 declaration under Section 564(b)(1) of the Act, 21 U.S.C. section 360bbb-3(b)(1),  unless the authorization is terminated or revoked sooner. Performed at [facility], [address] [place],  [address]      Labs: BNP (last 3 results) No results for input(s): BNP in the last 8760 hours. Basic Metabolic Panel: Recent Labs  Lab 01/26/20 2105 01/27/20 0400 01/28/20 0457  NA 137 142 141  K 3.7 4.4 4.3  CL 101 107 107  CO2 25 28 26   GLUCOSE 91 118* 113*  BUN 9 9 13   CREATININE 0.76 0.74 0.76  CALCIUM 9.3 9.3 8.8*   Liver Function Tests: Recent Labs  Lab 01/26/20 2105 01/27/20 0400 01/28/20 0457  AST 17 18 15   ALT 17 17 15   ALKPHOS 73 67 55  BILITOT 0.8 0.5 0.4  PROT 7.4 6.7 6.0*  ALBUMIN 4.1 3.5 3.0*   Recent Labs  Lab 01/26/20 2105  LIPASE 27   No results for input(s): AMMONIA in the last 168 hours. CBC: Recent Labs  Lab 01/26/20 2105 01/27/20 0400 01/28/20 0457  WBC 8.9 8.4 6.4  HGB 13.0 12.0 10.8*  HCT 40.0 37.0 34.2*  MCV 89.3 89.8 91.4  PLT 371 326 325   Cardiac Enzymes: No results for input(s): CKTOTAL, CKMB, CKMBINDEX, TROPONINI in the last 168 hours. BNP: Invalid input(s): POCBNP CBG: No results for input(s): GLUCAP in the last 168  hours. D-Dimer No results for input(s): DDIMER in the last 72 hours. Hgb A1c No results for input(s): HGBA1C in the last 72 hours. Lipid Profile No results for input(s): CHOL, HDL, LDLCALC, TRIG, CHOLHDL, LDLDIRECT in the last 72 hours. Thyroid function studies No results for input(s): TSH, T4TOTAL, T3FREE, THYROIDAB in the last 72 hours.  Invalid input(s): FREET3 Anemia work up No results for input(s): VITAMINB12, FOLATE, FERRITIN, TIBC, IRON, RETICCTPCT in the last 72 hours. Urinalysis    Component Value Date/Time   COLORURINE YELLOW 01/26/2020 2000   APPEARANCEUR CLEAR 01/26/2020 2000   LABSPEC 1.008 01/26/2020 2000   PHURINE 7.0 01/26/2020 2000   GLUCOSEU NEGATIVE 01/26/2020 2000   GLUCOSEU NEGATIVE 05/29/2010 1622   HGBUR NEGATIVE 01/26/2020 2000   BILIRUBINUR NEGATIVE 01/26/2020 2000   BILIRUBINUR Positive 08/16/2015 0928   KETONESUR NEGATIVE 01/26/2020 2000   PROTEINUR NEGATIVE 01/26/2020 2000   UROBILINOGEN 0.2 06/16/2014 0942   NITRITE NEGATIVE 01/26/2020 2000   LEUKOCYTESUR NEGATIVE 01/26/2020 2000   Sepsis Labs Invalid input(s): PROCALCITONIN,  WBC,  LACTICIDVEN Microbiology Recent Results (from the past 240 hour(s))  SARS Coronavirus 2 by RT PCR (hospital order, performed in [place] hospital lab) Nasopharyngeal Nasopharyngeal Swab     Status: None   Collection Time: 01/26/20  9:53 PM   Specimen: Nasopharyngeal Swab  Result Value Ref Range Status   SARS Coronavirus 2 NEGATIVE NEGATIVE Final    Comment: (NOTE) SARS-CoV-2 target nucleic acids are NOT DETECTED. The SARS-CoV-2 RNA is generally detectable in upper and lower respiratory specimens during the acute phase of infection. The lowest concentration of SARS-CoV-2 viral copies this assay can detect is 250 copies / mL. A negative result does not preclude SARS-CoV-2 infection and should not be used as the sole basis for treatment or other patient management decisions.  A negative result may occur  with improper specimen collection / handling, submission of specimen other than nasopharyngeal swab, presence of viral mutation(s) within the areas targeted by this assay, and inadequate number of viral copies (<250 copies / mL). A negative result must be combined with clinical observations, patient history, and epidemiological information. Fact Sheet for Patients:   StrictlyIdeas.no  Fact Sheet for Healthcare Providers: BankingDealers.co.za This test is not yet approved or cleared  by the [place] FDA and has been authorized for detection and/or diagnosis of SARS-CoV-2 by FDA under an Emergency Use Authorization (EUA).  This EUA will remain in effect (meaning this test can be used) for the duration of the COVID-19 declaration under Section 564(b)(1) of the Act, 21 U.S.C. section 360bbb-3(b)(1), unless the authorization is terminated or revoked sooner. Performed at [facility] [address] [place], [place] [address]      Time coordinating discharge: 25 minutes      SIGNED:   Edwin Dada, MD  [facility] 01/28/2020, 5:20 PM

## 2020-01-29 ENCOUNTER — Other Ambulatory Visit: Payer: Self-pay | Admitting: *Deleted

## 2020-01-29 ENCOUNTER — Telehealth: Payer: Self-pay | Admitting: *Deleted

## 2020-01-29 ENCOUNTER — Other Ambulatory Visit: Payer: Self-pay

## 2020-01-29 ENCOUNTER — Emergency Department (HOSPITAL_COMMUNITY)
Admission: EM | Admit: 2020-01-29 | Discharge: 2020-01-29 | Disposition: A | Discharge status: Home / Self Care | Payer: 59 | Attending: Emergency Medicine | Admitting: Emergency Medicine

## 2020-01-29 ENCOUNTER — Encounter (HOSPITAL_COMMUNITY): Payer: Self-pay

## 2020-01-29 ENCOUNTER — Telehealth (INDEPENDENT_AMBULATORY_CARE_PROVIDER_SITE_OTHER): Payer: 59 | Admitting: Family Medicine

## 2020-01-29 DIAGNOSIS — Z8719 Personal history of other diseases of the digestive system: Secondary | ICD-10-CM | POA: Diagnosis not present

## 2020-01-29 DIAGNOSIS — Z888 Allergy status to other drugs, medicaments and biological substances status: Secondary | ICD-10-CM | POA: Diagnosis not present

## 2020-01-29 DIAGNOSIS — Z885 Allergy status to narcotic agent status: Secondary | ICD-10-CM | POA: Diagnosis not present

## 2020-01-29 DIAGNOSIS — G894 Chronic pain syndrome: Secondary | ICD-10-CM

## 2020-01-29 DIAGNOSIS — R110 Nausea: Secondary | ICD-10-CM | POA: Diagnosis not present

## 2020-01-29 DIAGNOSIS — Z79899 Other long term (current) drug therapy: Secondary | ICD-10-CM | POA: Diagnosis not present

## 2020-01-29 DIAGNOSIS — R112 Nausea with vomiting, unspecified: Secondary | ICD-10-CM | POA: Diagnosis not present

## 2020-01-29 DIAGNOSIS — E2740 Unspecified adrenocortical insufficiency: Secondary | ICD-10-CM

## 2020-01-29 DIAGNOSIS — Z91041 Radiographic dye allergy status: Secondary | ICD-10-CM | POA: Diagnosis not present

## 2020-01-29 DIAGNOSIS — Z91030 Bee allergy status: Secondary | ICD-10-CM | POA: Diagnosis not present

## 2020-01-29 DIAGNOSIS — Z882 Allergy status to sulfonamides status: Secondary | ICD-10-CM | POA: Insufficient documentation

## 2020-01-29 DIAGNOSIS — Z853 Personal history of malignant neoplasm of breast: Secondary | ICD-10-CM | POA: Diagnosis not present

## 2020-01-29 DIAGNOSIS — Z881 Allergy status to other antibiotic agents status: Secondary | ICD-10-CM | POA: Diagnosis not present

## 2020-01-29 DIAGNOSIS — R1111 Vomiting without nausea: Secondary | ICD-10-CM | POA: Diagnosis not present

## 2020-01-29 LAB — BASIC METABOLIC PANEL
Anion gap: 11 (ref 5–15)
BUN: 13 mg/dL (ref 6–20)
CO2: 29 mmol/L (ref 22–32)
Calcium: 9 mg/dL (ref 8.9–10.3)
Chloride: 100 mmol/L (ref 98–111)
Creatinine, Ser: 0.91 mg/dL (ref 0.44–1.00)
GFR calc Af Amer: >60 mL/min (ref >60)
GFR calc non Af Amer: >60 mL/min (ref >60)
Glucose, Bld: 89 mg/dL (ref 70–99)
Potassium: 3.8 mmol/L (ref 3.5–5.1)
Sodium: 140 mmol/L (ref 135–145)

## 2020-01-29 LAB — CBC WITH DIFFERENTIAL/PLATELET
Abs Immature Granulocytes: 0.04 10*3/uL (ref 0.00–0.07)
Basophils Absolute: 0 10*3/uL (ref 0.0–0.1)
Basophils Relative: 1 %
Eosinophils Absolute: 0.2 10*3/uL (ref 0.0–0.5)
Eosinophils Relative: 3 %
HCT: 36 % (ref 36.0–46.0)
Hemoglobin: 11.6 g/dL — ABNORMAL LOW (ref 12.0–15.0)
Immature Granulocytes: 1 %
Lymphocytes Relative: 12 %
Lymphs Abs: 0.8 10*3/uL (ref 0.7–4.0)
MCH: 28.9 pg (ref 26.0–34.0)
MCHC: 32.2 g/dL (ref 30.0–36.0)
MCV: 89.8 fL (ref 80.0–100.0)
Monocytes Absolute: 0.6 10*3/uL (ref 0.1–1.0)
Monocytes Relative: 9 %
Neutro Abs: 5.4 10*3/uL (ref 1.7–7.7)
Neutrophils Relative %: 74 %
Platelets: 327 10*3/uL (ref 150–400)
RBC: 4.01 MIL/uL (ref 3.87–5.11)
RDW: 14 % (ref 11.5–15.5)
WBC: 7.1 10*3/uL (ref 4.0–10.5)
nRBC: 0 % (ref 0.0–0.2)

## 2020-01-29 LAB — RAPID URINE DRUG SCREEN, HOSP PERFORMED
Amphetamines: NOT DETECTED
Barbiturates: NOT DETECTED
Benzodiazepines: NOT DETECTED
Cocaine: NOT DETECTED
Opiates: NOT DETECTED
Tetrahydrocannabinol: NOT DETECTED

## 2020-01-29 MED ORDER — OXYCODONE HCL 10 MG PO TABS: ORAL_TABLET | ORAL | 0 refills | Status: DC

## 2020-01-29 MED ORDER — PROMETHAZINE HCL 25 MG/ML IJ SOLN
25.0000 mg | Freq: Once | INTRAMUSCULAR | Status: AC
  Administered 2020-01-29: 25 mg via INTRAVENOUS
  Filled 2020-01-29: qty 1

## 2020-01-29 MED ORDER — PROMETHAZINE HCL 25 MG RE SUPP: 25.0000 mg | Freq: Four times a day (QID) | RECTAL | 0 refills | Status: DC | PRN

## 2020-01-29 MED ORDER — ONDANSETRON HCL 4 MG/2ML IJ SOLN
4.0000 mg | Freq: Once | INTRAMUSCULAR | Status: AC
  Administered 2020-01-29: 4 mg via INTRAVENOUS
  Filled 2020-01-29: qty 2

## 2020-01-29 MED ORDER — SODIUM CHLORIDE 0.9 % IV BOLUS
1000.0000 mL | Freq: Once | INTRAVENOUS | Status: AC
  Administered 2020-01-29: 1000 mL via INTRAVENOUS

## 2020-01-29 MED ORDER — HYDROCORTISONE NA SUCCINATE PF 100 MG IJ SOLR
100.0000 mg | Freq: Once | INTRAMUSCULAR | Status: AC
  Administered 2020-01-29: 100 mg via INTRAVENOUS
  Filled 2020-01-29: qty 2

## 2020-01-29 MED FILL — PROMETHAZINE HCL 25 MG SUPP: 25 | 2 days supply | Qty: 8 | Fill #0

## 2020-01-29 MED FILL — oxyCODONE HCL 10 MG TABS: 10 | 30 days supply | Qty: 180 | Fill #0

## 2020-01-29 NOTE — Discharge Instructions (Signed)
Follow up with your Physician for recheck  

## 2020-01-29 NOTE — Telephone Encounter (Signed)
[name], moronta are scheduled for a virtual visit with your provider today.    Just as we do with appointments in the office, we must obtain your consent to participate.  Your consent will be active for this visit and any virtual visit you may have with one of our providers in the next 365 days.    If you have a MyChart account, I can also send a copy of this consent to you electronically.  All virtual visits are billed to your insurance company just like a traditional visit in the office.  As this is a virtual visit, video technology does not allow for your provider to perform a traditional examination.  This may limit your provider's ability to fully assess your condition.  If your provider identifies any concerns that need to be evaluated in person or the need to arrange testing such as labs, EKG, etc, we will make arrangements to do so.    Although advances in technology are sophisticated, we cannot ensure that it will always work on either your end or our end.  If the connection with a video visit is poor, we may have to switch to a telephone visit.  With either a video or telephone visit, we are not always able to ensure that we have a secure connection.   I need to obtain your verbal consent now.   Are you willing to proceed with your visit today?   Kathleen [name] has provided verbal consent on 01/29/2020 for a virtual visit (video or telephone).   Dayton Bailiff, LPN 12/25/8341  7:35 AM

## 2020-01-29 NOTE — Patient Outreach (Signed)
[name]  01/29/2020  Kathleen [name] Oct 09, 1960 [id number]   Transition of care   Referral received : 01/26/20 Initial outreach :01/29/20- see note  Insurance : U[employer]    Subjective: In preparing for initial transition of care call, noted patient currently in ED at [facility].   Objective: Per electronic record patient was hospitalized at [facility] 6/1-01/28/20 for Acute Adrenal Insufficiency.  Past Medical history significant for : Adrenal insufficiency , sacroidosis, fibromyalgia, hyperlipidemia.    Plan Will follow disposition  for transition of care outreach .    Joylene Draft, RN, BSN  [facility] Management Coordinator  [phone]- Mobile [phone]- Toll Free Main Office

## 2020-01-29 NOTE — ED Notes (Signed)
Pt provided with warm blanked and repositioned. Emesis bag provided, reports feeling sick to stomach. Resting quietly in bed.

## 2020-01-29 NOTE — ED Notes (Signed)
Pt has taken one sip of ginger ale and couple of sips of sprite along with one saltine cracker with strong encouragement.  Pt keeps saying she is sick.

## 2020-01-29 NOTE — ED Notes (Signed)
Pt ambulated to BR x 2 without difficultly.

## 2020-01-29 NOTE — ED Provider Notes (Addendum)
[facility] EMERGENCY DEPARTMENT Provider Note   CSN: [id number] Arrival date & time: 01/29/20  4128     History Chief Complaint  Patient presents with  . Emesis    Kathleen [name] is a 59 y.o. female.  Pt was seen here 3 days ago and admitted for vomiting.  Pt reports she is still nauseated.  Pt unable to take her prednisone due to nausea.   The history is provided by the patient. The history is limited by a language barrier. No language interpreter was used.  Emesis Severity:  Moderate Timing:  Constant Progression:  Worsening Chronicity:  New Relieved by:  Nothing Worsened by:  Nothing Ineffective treatments:  None tried Associated symptoms: no abdominal pain   Risk factors: no alcohol use    Pt had a ct on 6/1.     Past Medical History:  Diagnosis Date  . Adrenal insufficiency ([place])   . Anemia   . Avascular bone necrosis ([place])   . Breast fibrocystic disorder   . Chronic pain   . Fibromyalgia   . Gastroesophageal reflux disease   . Mitral valve prolapse   . Orthostatic hypotension   . Peripheral neuropathy   . Restrictive lung disease    Moderate to severe  . Sarcoidosis    Biopsy proven - [facility]  . SOB (shortness of breath)    chronic    Patient Active Problem List   Diagnosis Date Noted  . Acute adrenal insufficiency ([place]) 01/27/2020  . Interstitial lung disease ([place]) 04/14/2019  . Non-intractable vomiting 09/22/2018  . Adrenal insufficiency (Addison's disease) ([place])   . Nausea vomiting and diarrhea   . Acute adrenal crisis ([place]) 10/15/2017  . Gastroesophageal reflux disease 10/15/2017  . Personal history of noncompliance with medical treatment, presenting hazards to health 04/26/2017  . Hypothyroidism 01/15/2017  . Symptomatic bradycardia   . Acute respiratory failure with hypoxia ([place])   . Adrenal insufficiency ([place]) 08/17/2016  . Hypokalemia 08/17/2016  . Chronic pain syndrome 04/20/2016  . Vaginal bleeding 01/02/2016  . Hypovolemic shock ([place])  12/30/2015  . Nausea and vomiting 12/29/2015  . Hypotension 12/29/2015  . Constipation 11/14/2015  . Avascular necrosis of bone of hip, left ([place]) 04/05/2015  . Bimalleolar fracture of right ankle 02/11/2015  . Post herpetic neuralgia 10/22/2014  . Hyperlipidemia 08/25/2014  . Obstructive sleep apnea 02/18/2014  . Obesity, unspecified 05/06/2013  . Fibromyalgia 02/11/2013  . Depression 02/11/2013  . Urinary retention 02/02/2013  . Pedal edema 01/22/2013  . Orthostatic hypotension 01/16/2013  . Helicobacter pylori gastritis 05/23/2012  . Fibrocystic breast disease in female 04/09/2012  . Anxiety state 10/19/2010  . CHEST PAIN 11/22/2008  . Sarcoidosis 06/21/2008    Past Surgical History:  Procedure Laterality Date  . BIOPSY  10/17/2018   Procedure: BIOPSY;  Surgeon: Rogene Houston, MD;  Location: [facility] ENDO SUITE;  Service: Endoscopy;;  duodenum, antrum, gastric body  . BREAST LUMPECTOMY  01/12/2011   right  . ESOPHAGOGASTRODUODENOSCOPY (EGD) WITH PROPOFOL N/A 10/17/2018   Procedure: ESOPHAGOGASTRODUODENOSCOPY (EGD) WITH PROPOFOL;  Surgeon: Rogene Houston, MD;  Location: [facility] ENDO SU[facility];  Service: Endoscopy;  Laterality: N/A;  2:25  . TUBAL LIGATION       OB History    Gravida  1   Para  1   Term  1   Preterm      AB      Living        SAB      TAB  Ectopic      Multiple      Live Births              Family History  Problem Relation Age of Onset  . Cardiomyopathy Mother   . Breast cancer Mother   . Prostate cancer Father   . Neurofibromatosis Brother   . Heart disease Maternal Aunt   . Bipolar disorder Daughter   . Colon cancer Neg Hx     Social History   Tobacco Use  . Smoking status: Never Smoker  . Smokeless tobacco: Never Used  Substance Use Topics  . Alcohol use: No  . Drug use: No    Home Medications Prior to Admission medications   Medication Sig Start Date End Date Taking? Authorizing Provider  acetaminophen (TYLENOL) 500 MG  tablet Take 1,000 mg by mouth every 8 (eight) hours as needed for headache.    [provider]  docusate sodium (COLACE) 100 MG capsule Take 100 mg by mouth 2 (two) times daily.    [provider]  EPINEPHrine 0.3 mg/0.3 mL IJ SOAJ injection Inject 0.3 mLs (0.3 mg total) into the muscle as needed for anaphylaxis. 09/15/19   [name], MD  ferrous sulfate 325 (65 FE) MG EC tablet Take 325 mg by mouth daily with breakfast.    [provider]  hydrocortisone (CORTEF) 10 MG tablet TAKE 2 TABLETS BY MOUTH AT 8AM Patient taking differently: Take 20 mg by mouth in the morning.  11/25/19   Nida, Marella Chimes, MD  levothyroxine (SYNTHROID) 75 MCG tablet TAKE 1 TABLET BY MOUTH BEFORE BREAKFAST EVERY DAY EXCEPT TAKE 1/2 TABLET 1 DAY A WEEK Patient taking differently: Take 75 mcg by mouth daily before breakfast.  12/04/19   Nida, Marella Chimes, MD  linaclotide [name]) 145 MCG CAPS capsule Take 1 capsule (145 mcg total) by mouth daily before breakfast. 09/15/19   [name], MD  midodrine (PROAMATINE) 5 MG tablet TAKE 1 TABLET BY MOUTH TWICE DAILY WITH A MEAL Patient taking differently: Take 5 mg by mouth in the morning. TAKE 1 TABLET BY MOUTH TWICE DAILY WITH A MEAL 11/06/19   Luking, Elayne Snare, MD  ondansetron (ZOFRAN) 4 MG tablet Take 1 tablet (4 mg total) by mouth daily as needed for nausea or vomiting. 01/28/20 01/27/21  Danford, Suann Larry, MD  ondansetron (ZOFRAN-ODT) 8 MG disintegrating tablet DISSOLVE 1 TABLET BY MOUTH EVERY 8 HOURS AS NEEDED FOR NAUSEA OR VOMITING Patient taking differently: Take 8 mg by mouth every 8 (eight) hours as needed for nausea or vomiting.  08/11/19   [name], MD  Oxycodone HCl 10 MG TABS Take one tablet po q 4 hrs prn pain. Max 6 per day 01/29/20   [name], MD  Oxycodone HCl 10 MG TABS Take one tablet po q 4 hours prn pain. Max 6 per day 01/29/20   [name], MD  Oxycodone HCl 10 MG TABS Take one tablet po q 4 hours prn  pain. Max 6 per day 01/29/20   [name], MD  pantoprazole (PROTONIX) 40 MG tablet TAKE 1 TABLET BY MOUTH ONCE DAILY Patient taking differently: Take 40 mg by mouth daily.  10/09/19   [name], MD  predniSONE (DELTASONE) 5 MG tablet Take 5 mg by mouth daily.  02/23/19 02/18/20  [provider]  promethazine (PHENERGAN) 25 MG suppository Place 1 suppository (25 mg total) rectally every 6 (six) hours as needed for nausea or vomiting.  01/29/20   [name], MD  promethazine (PHENERGAN) 25 MG tablet TAKE 1 TABLET BY MOUTH EVERY 8 HOURS AS NEEDED FOR NAUSEA OR VOMITING Patient taking differently: Take 25 mg by mouth every 8 (eight) hours as needed for nausea or vomiting.  04/28/19   [name], MD  rosuvastatin (CRESTOR) 10 MG tablet Take 1 tablet (10 mg total) by mouth daily. 01/14/20   [name], MD  Vitamin D, Ergocalciferol, (DRISDOL) 1.25 MG (50000 UNIT) CAPS capsule Take 1 capsule (50,000 Units total) by mouth every 7 (seven) days. 01/14/20   [name], MD    Allergies    Bee venom, Bactrim [sulfamethoxazole-trimethoprim], Codeine, Contrast media [iodinated diagnostic agents], and Morphine and related  Review of Systems   Review of Systems  Gastrointestinal: Positive for vomiting. Negative for abdominal pain.  All other systems reviewed and are negative.   Physical Exam Updated Vital Signs BP (!) 103/42   Pulse 76   Temp 98.4 F (36.9 C) (Oral)   Resp 18   Ht 5\' 10"  (1.778 m)   Wt 101.1 kg   LMP 05/17/2016   SpO2 99%   BMI 31.98 kg/m   Physical Exam Vitals and nursing note reviewed.  Constitutional:      Appearance: She is well-developed.  HENT:     Head: Normocephalic.     Nose: Nose normal.     Mouth/Throat:     Mouth: Mucous membranes are moist.  Cardiovascular:     Rate and Rhythm: Normal rate.  Pulmonary:     Effort: Pulmonary effort is normal.  Abdominal:     General: Abdomen is flat. There is no distension.  Musculoskeletal:         General: Normal range of motion.     Cervical back: Normal range of motion.  Skin:    General: Skin is warm.  Neurological:     General: No focal deficit present.     Mental Status: She is alert and oriented to person, place, and time.  Psychiatric:        Mood and Affect: Mood normal.     ED Results / Procedures / Treatments   Labs (all labs ordered are listed, but only abnormal results are displayed) Labs Reviewed  CBC WITH DIFFERENTIAL/PLATELET - Abnormal; Notable for the following components:      Result Value   Hemoglobin 11.6 (*)    All other components within normal limits  BASIC METABOLIC PANEL  RAPID URINE DRUG SCREEN, HOSP PERFORMED    EKG None  Radiology No results found.  Procedures Procedures (including critical care time)  Medications Ordered in ED Medications  hydrocortisone sodium succinate (SOLU-CORTEF) 100 MG injection 100 mg (100 mg Intravenous Given 01/29/20 1103)  ondansetron (ZOFRAN) injection 4 mg (4 mg Intravenous Given 01/29/20 1103)  sodium chloride 0.9 % bolus 1,000 mL (0 mLs Intravenous Stopped 01/29/20 1319)  sodium chloride 0.9 % bolus 1,000 mL (0 mLs Intravenous Stopped 01/29/20 1320)  promethazine (PHENERGAN) injection 25 mg (25 mg Intravenous Given 01/29/20 1344)    ED Course  I have reviewed the triage vital signs and the nursing notes.  Pertinent labs & imaging results that were available during my care of the patient were reviewed by me and considered in my medical decision making (see chart for details).    MDM Rules/Calculators/A&P                      MDM:  Pt given  zofran iv, pt reports she still feels nauseated.  Pt given phenergan IV.  Pt's labs reviewed. No sign of dehydration.  Pt given solucortef.  Pt sleepy after phenergan. I discussed pt with Dr. Audie Pinto who admitted her last.  He advised pt did not vomit during hospital stay. He advised if no vomiting in ED pt can go home  Pt observed  No vomiting while in ED.  Pt refuses  to drink.  Pt states there is something wrong with her and she wants to be admitted.  Pt reports she has been on nausea medications for over a  year and says that is not normal.  Pt reports she can not eat.  Pt's weight 220 pounds and shows no sign of malnutrition.  Dr. Ashok Cordia in to see and evaluate pt.  Pt offered option of overnight observation for vomiting.  Pt will go home.  She will return if she does not improve  Final Clinical Impression(s) / ED Diagnoses Final diagnoses:  Non-intractable vomiting with nausea, unspecified vomiting type    Rx / DC Orders ED Discharge Orders    None    An After Visit Summary was printed and given to the patient.   Fransico Meadow, PA-C 01/29/20 1519    Fransico Meadow, PA-C 01/29/20 1720    Fransico Meadow, PA-C 01/29/20 1724    Lajean Saver, MD 02/01/20 1404

## 2020-01-29 NOTE — Progress Notes (Signed)
Subjective:    Patient ID: Kathleen [name], female    DOB: Nov 25, 1960, 59 y.o.   MRN: [id number]  HPI Hospital follow up . Pt states she is thinking she needs to go back to hospital. No vomiting now just feels weak. Pt states she is not able to get out of bed.  Patient states she has had intractable vomiting ever since coming back from the hospital yesterday Hospital notes were reviewed Patient has tried oral Phenergan but has not been able to keep it down She states she feels so weak and dizzy that she cannot get out of bed She is tried sipping on liquids multiple different times throughout the day but not having any success in keeping things in line This patient was seen today for chronic pain  The medication list was reviewed and updated.   -Compliance with medication: Patient relates compliance with her pain medicine  - Number patient states they take daily: She takes approximately 6 a day.  She does state that she has been unable to keep this down over the past 24 hours.  -when was the last dose patient took?  Earlier today but vomited it up  The patient was advised the importance of maintaining medication and not using illegal substances with these.  Here for refills and follow up  The patient was educated that we can provide 3 monthly scripts for their medication, it is their responsibility to follow the instructions.  Side effects or complications from medications: When things are going well she tolerates the medicine well and it does help her keep her pain under control  Patient is aware that pain medications are meant to minimize the severity of the pain to allow their pain levels to improve to allow for better function. They are aware of that pain medications cannot totally remove their pain.  Due for UDT ( at least once per year) : This summer      Virtual Visit via Telephone Note  I connected with TAMSYN OWUSU on 01/29/20 at  8:40 AM EDT by telephone and verified  that I am speaking with the correct person using two identifiers.  Location: Patient: home Provider: office   I discussed the limitations, risks, security and privacy concerns of performing an evaluation and management service by telephone and the availability of in person appointments. I also discussed with the patient that there may be a patient responsible charge related to this service. The patient expressed understanding and agreed to proceed.   History of Present Illness:    Observations/Objective:   Assessment and Plan:   Follow Up Instructions:    I discussed the assessment and treatment plan with the patient. The patient was provided an opportunity to ask questions and all were answered. The patient agreed with the plan and demonstrated an understanding of the instructions.   The patient was advised to call back or seek an in-person evaluation if the symptoms worsen or if the condition fails to improve as anticipated.  I provided 20 minutes of non-face-to-face time during this encounter.      Review of Systems  Constitutional: Positive for fatigue. Negative for activity change and appetite change.  HENT: Negative for congestion and rhinorrhea.   Respiratory: Negative for cough and shortness of breath.   Cardiovascular: Negative for chest pain and leg swelling.  Gastrointestinal: Positive for nausea and vomiting. Negative for abdominal pain and diarrhea.  Endocrine: Negative for polydipsia and polyphagia.  Musculoskeletal: Positive for arthralgias and gait  problem.  Skin: Negative for color change.  Neurological: Positive for weakness. Negative for dizziness.  Psychiatric/Behavioral: Negative for behavioral problems and confusion.       Objective:   Physical Exam  Today's visit was via telephone Physical exam was not possible for this visit       Assessment & Plan:  The patient was seen in followup for chronic pain. A review over at their current pain  status was discussed. Drug registry was checked. Prescriptions were given.  Regular follow-up recommended. Discussion was held regarding the importance of compliance with medication as well as pain medication contract.  Patient was informed that medication may cause drowsiness and should not be combined  with other medications/alcohol or street drugs. If the patient feels medication is causing altered alertness then do not drive or operate dangerous equipment.  Drug registry was checked prescriptions were sent in she is to follow up in person on the next visit  Intractable vomiting possibly Addison's related issue.  Because she cannot keep her regular medicines down including her hydrocortisone it is necessary for her to go back to the ER for hopefully IV fluids steroids and potentially go home but patient has had numerous spells like this in the past  Phenergan suppository sent into her pharmacy of choice for her to use when having severe nausea unable to keep things down  We will provide her follow-up after her hospital evaluation Triage at ER was called by ER staff to alert them that the patient more than likely would be coming back

## 2020-01-29 NOTE — ED Triage Notes (Signed)
Pt is having NV and abdominal pain. Was discharged yesterday. Pt tried to take her medicine this morning and vomited it up. Pt has Addison's disease. VSS. Pt has 20G in left AC.

## 2020-02-01 ENCOUNTER — Other Ambulatory Visit: Payer: Self-pay | Admitting: *Deleted

## 2020-02-01 NOTE — Patient Outreach (Addendum)
[facility]) Care Management  02/01/2020  Kathleen [name] 01/25/1961 [id number]   Transition of care call   Referral received:01/26/20 Initial outreach:01/29/20 Insurance: [place] [employer]     Subjective:  Initial outreach call to patient to complete transition of care call, introduced self and reason for call. Patient states she will need to call me back, call ended prior to offering return call later today.      Objective:  Per electronic record patient was hospitalized at [facility] 6/1-01/28/20 for Acute Adrenal Insufficiency, ED visit on 01/29/20 for Nausea and vomiting .   Past Medical history significant for : Adrenal insufficiency , sacroidosis, fibromyalgia, hyperlipidemia.    Assessment:  Patient voices good understanding of all discharge instructions.  See transition of care flowsheet for assessment details.   Plan No return call from patient .  Will send unsuccessful outreach letter to patient and plan return call in the next 4 business days.    Joylene Draft, RN, BSN  [place] Management Coordinator  [phone]- Mobile [phone]- Toll Free Main Office

## 2020-02-02 ENCOUNTER — Encounter: Payer: Self-pay | Admitting: Family Medicine

## 2020-02-02 DIAGNOSIS — Z79891 Long term (current) use of opiate analgesic: Secondary | ICD-10-CM

## 2020-02-02 HISTORY — DX: Long term (current) use of opiate analgesic: Z79.891

## 2020-02-03 ENCOUNTER — Other Ambulatory Visit: Payer: Self-pay | Admitting: *Deleted

## 2020-02-03 ENCOUNTER — Other Ambulatory Visit: Payer: Self-pay | Admitting: Family Medicine

## 2020-02-03 NOTE — Patient Outreach (Signed)
[place] [facility]) Care Management  02/03/2020  Kathleen [name] 13-Feb-1961 [id number]   Transition of care call Referral received: 01/26/20 Initial outreach attempt: 01/29/20 Insurance: UMR    2nd unsuccessful telephone call to patient's preferred contact number in order to complete post hospital discharge transition of care assessment , no answer left HIPAA compliant message requesting return call.    Objective: Per electronic record patient was hospitalized at [facility] 6/1-01/28/20 for Acute Adrenal Insufficiency, ED visit on 01/29/20 for Nausea and vomiting .  Past Medical history significant for : Adrenal insufficiency , sacroidosis, fibromyalgia, hyperlipidemia.    Plan If no return call from patient will attempt 3rd outreach in the next 4 business days.   Joylene Draft, RN, BSN  [place] Management Coordinator  [phone]- Mobile [phone]- Toll Free Main Office

## 2020-02-04 ENCOUNTER — Other Ambulatory Visit: Payer: Self-pay

## 2020-02-04 ENCOUNTER — Telehealth (INDEPENDENT_AMBULATORY_CARE_PROVIDER_SITE_OTHER): Payer: 59 | Admitting: Family Medicine

## 2020-02-04 ENCOUNTER — Telehealth: Payer: Self-pay | Admitting: Family Medicine

## 2020-02-04 DIAGNOSIS — E038 Other specified hypothyroidism: Secondary | ICD-10-CM

## 2020-02-04 DIAGNOSIS — R112 Nausea with vomiting, unspecified: Secondary | ICD-10-CM

## 2020-02-04 DIAGNOSIS — E2740 Unspecified adrenocortical insufficiency: Secondary | ICD-10-CM

## 2020-02-04 NOTE — Progress Notes (Signed)
   Subjective:    Patient ID: Kathleen [name], female    DOB: 03-25-1961, 59 y.o.   MRN: [id number]  HPI Pt was in ER on 01/29/20 for emesis. Pt states that she has had some episode of vomiting. Pt is also having some headaches. Pt has Phenergan at home for nausea/vomiting; pt states that helps some.  Severe nausea and intermittent vomiting.  Feeling very fatigued has been to the ER 2 separate times she is able to keep her medicines down recently but is just struggling with the nausea. Virtual Visit via Telephone Note  I connected with Kathleen [name] on 02/04/20 at  9:00 AM EDT by telephone and verified that I am speaking with the correct person using two identifiers.  Location: Patient: home Provider: office   I discussed the limitations, risks, security and privacy concerns of performing an evaluation and management service by telephone and the availability of in person appointments. I also discussed with the patient that there may be a patient responsible charge related to this service. The patient expressed understanding and agreed to proceed.   History of Present Illness:    Observations/Objective:   Assessment and Plan:   Follow Up Instructions:    I discussed the assessment and treatment plan with the patient. The patient was provided an opportunity to ask questions and all were answered. The patient agreed with the plan and demonstrated an understanding of the instructions.   The patient was advised to call back or seek an in-person evaluation if the symptoms worsen or if the condition fails to improve as anticipated.  I provided 20 minutes of non-face-to-face time during this encounter.       Review of Systems  Constitutional: Negative for activity change, fatigue and fever.  HENT: Negative for congestion and rhinorrhea.   Respiratory: Negative for cough, chest tightness and shortness of breath.   Cardiovascular: Negative for chest pain and leg swelling.    Gastrointestinal: Positive for nausea. Negative for abdominal pain and vomiting.  Skin: Negative for color change.  Neurological: Negative for dizziness and headaches.  Psychiatric/Behavioral: Negative for agitation and behavioral problems.       Objective:   Physical Exam  Today's visit was via telephone Physical exam was not possible for this visit       Assessment & Plan:  1. Nausea and vomiting, intractability of vomiting not specified, unspecified vomiting type Continue Zofran small meals frequently warning signs discussed when to go to ER  2. Adrenal insufficiency (HCC) Increase the hydrocortisone to 2 tablets in the morning 1 at noon for the next 5 days We will communicate with endocrinology if other measures would be helpful  3. Other specified hypothyroidism Continue current medication  If progressive symptoms especially if unable to keep medications down 2 days in a row very important to go to ER Follow-up here 4 to 6 weeks

## 2020-02-04 NOTE — Telephone Encounter (Signed)
Pt contact Kathleen [name] for her medical records from her ER and hospital stay last week. They stated they do not have a medical records dept there anymore. She is wanting [place] to print out her records for her to send to an insurance company. Did tell patient she would need to sign a release when she picks up records.   She is hoping to pick them up today. Please call pt when ready for pick up.

## 2020-02-04 NOTE — Telephone Encounter (Signed)
Pt notified she would have to go through [facility].

## 2020-02-05 NOTE — Progress Notes (Signed)
Please let the patient know that I did touch base with Dr. Dorris Fetch he agrees with the management we did.  He states that his office will work her in sooner than her scheduled appointment thank you

## 2020-02-05 NOTE — Progress Notes (Signed)
Nicki Reaper,  Greetings. Yes it is reasonable adjustment.  She has a tendency to lose her tablets from vomiting, I allowed her previously to replace any loss of tablets by replacing the dose.  She also has injectable preparations for emergency. I will communicate with my receptionist to see her sooner than her scheduled appointment. Thanks.

## 2020-02-05 NOTE — Progress Notes (Signed)
02/05/20 left message to return call

## 2020-02-08 ENCOUNTER — Telehealth: Payer: Self-pay | Admitting: Family Medicine

## 2020-02-08 NOTE — Telephone Encounter (Signed)
You may call her and if she is stable , keep her original appt.

## 2020-02-08 NOTE — Telephone Encounter (Signed)
-----   Message from [name], MD sent at 02/05/2020 12:13 PM EDT -----   ----- Message ----- From: [name], MD Sent: 02/04/2020  12:30 PM EDT To: [name], MD  Mutual patient-recently with severe nausea has been to the ER twice I instructed her to increase hydrocortisone.  2 tablets in the morning 1 tablet at noon for the next 5 days then give [place] an update.  Does that seem reasonable?

## 2020-02-08 NOTE — Telephone Encounter (Signed)
She may do this increased dose through tomorrow then afterwards revert back to her normal dose Dr. Dorris Fetch stated that they would be seeing her sooner.  If patient has not heard from Dr. Liliane Channel office please have her call their office

## 2020-02-08 NOTE — Telephone Encounter (Signed)
Pt contacted and informed to do increased dose through tomorrow. Pt states she has not heard from [place] at this time. Pt advised to call [place] office to set up appointment. Pt verbalized understanding.

## 2020-02-08 NOTE — Progress Notes (Signed)
02/08/20 called and discussed with pt. Pt verbalized understanding.

## 2020-02-08 NOTE — Telephone Encounter (Signed)
Dr Dorris Fetch, Do I still need to work this patient in sooner than October?

## 2020-02-08 NOTE — Telephone Encounter (Signed)
Pt contacted office and is wanting to know if she is to continue to take her Hydrocortisone 10 mg 2 in the morning and one at noon. Pt was advised to take 2 in the morning and one at noon for 5 days on 02/04/2020. Pt states this has helped her nausea. She still is getting nauseous but not nearly as much. Please advise. Thank you.

## 2020-02-09 ENCOUNTER — Other Ambulatory Visit: Payer: Self-pay

## 2020-02-09 ENCOUNTER — Ambulatory Visit (INDEPENDENT_AMBULATORY_CARE_PROVIDER_SITE_OTHER): Payer: 59 | Admitting: "Endocrinology

## 2020-02-09 ENCOUNTER — Other Ambulatory Visit: Payer: Self-pay | Admitting: *Deleted

## 2020-02-09 ENCOUNTER — Encounter: Payer: Self-pay | Admitting: "Endocrinology

## 2020-02-09 VITALS — BP 117/71 | HR 61 | Ht 70.0 in | Wt 221.4 lb

## 2020-02-09 DIAGNOSIS — E039 Hypothyroidism, unspecified: Secondary | ICD-10-CM

## 2020-02-09 DIAGNOSIS — E2740 Unspecified adrenocortical insufficiency: Secondary | ICD-10-CM | POA: Diagnosis not present

## 2020-02-09 NOTE — Patient Outreach (Signed)
[facility]) Care Management  02/09/2020  Kathleen [name] 01/21/1961 [id number]   Transition of care call Referral received: 01/28/20 Initial outreach attempt: 02/01/20 Insurance: [place] unsuccessful telephone call to patient's preferred contact number in order to complete post hospital discharge transition of care assessment; Patient declines being able to  complete assessment at this time reports , declined follow up at this time.   Objective: Per electronic record patient was hospitalized at [facility] 6/1-01/28/20 for Acute Adrenal Insufficiency, ED visit on 01/29/20 for Nausea and vomiting .Past Medical history significant for : Adrenal insufficiency , sacroidosis, fibromyalgia, hyperlipidemia.  Plan: If no return call from patient, will close case to [facility] in 10 business days after initial post hospital discharge outreach, on 02/01/20.  Joylene Draft, RN, BSN  [place] Management Coordinator  [phone]- Mobile [phone]- Toll Free Main Office

## 2020-02-09 NOTE — Progress Notes (Signed)
02/09/2020, 4:42 PM            Endocrinology follow-up note    Subjective:    Patient ID: Kathleen [name], female    DOB: [birth year], PCP [name], MD   Past Medical History:  Diagnosis Date  . Adrenal insufficiency ([place])   . Anemia   . Avascular bone necrosis ([place])   . Breast fibrocystic disorder   . Chronic pain   . Encounter for long-term opiate analgesic use 02/02/2020   Patient has chronic pain with femoral head avascular necrosis.  Is under pain contract  . Fibromyalgia   . Gastroesophageal reflux disease   . Mitral valve prolapse   . Orthostatic hypotension   . Peripheral neuropathy   . Restrictive lung disease    Moderate to severe  . Sarcoidosis    Biopsy proven - [facility]  . SOB (shortness of breath)    chronic   Past Surgical History:  Procedure Laterality Date  . BIOPSY  10/17/2018   Procedure: BIOPSY;  Surgeon: Rogene Houston, MD;  Location: [facility];  Service: Endoscopy;;  duodenum, antrum, gastric body  . BREAST LUMPECTOMY  01/12/2011   right  . ESOPHAGOGASTRODUODENOSCOPY (EGD) WITH PROPOFOL N/A 10/17/2018   Procedure: ESOPHAGOGASTRODUODENOSCOPY (EGD) WITH PROPOFOL;  Surgeon: Rogene Houston, MD;  Location: [facility];  Service: Endoscopy;  Laterality: N/A;  2:25  . TUBAL LIGATION     Social History   Socioeconomic History  . Marital status: Married    Spouse name: Not on file  . Number of children: 1  . Years of education: Not on file  . Highest education level: Not on file  Occupational History  . Occupation: Teacher, early years/pre: [employer]  Tobacco Use  . Smoking status: Never Smoker  . Smokeless tobacco: Never Used  Vaping Use  . Vaping Use: Never used  Substance and Sexual Activity  . Alcohol use: No  . Drug use: No  . Sexual activity: Yes  Other Topics Concern  . Not on file  Social History Narrative   Daily caffeine    Social Determinants of Health    Financial Resource Strain:   . Difficulty of Paying Living Expenses:   Food Insecurity:   . Worried About Charity fundraiser in the Last Year:   . Arboriculturist in the Last Year:   Transportation Needs:   . Film/video editor (Medical):   Marland Kitchen Lack of Transportation (Non-Medical):   Physical Activity:   . Days of Exercise per Week:   . Minutes of Exercise per Session:   Stress:   . Feeling of Stress :   Social Connections:   . Frequency of Communication with Friends and Family:   . Frequency of Social Gatherings with Friends and Family:   . Attends Religious Services:   . Active Member of Clubs or Organizations:   . Attends Archivist Meetings:   Marland Kitchen Marital Status:    Family History  Problem Relation Age of Onset  . Cardiomyopathy Mother   . Breast cancer Mother   . Prostate cancer Father   . Neurofibromatosis Brother   . Heart disease Maternal Aunt   .  Bipolar disorder Daughter   . Colon cancer Neg Hx    Outpatient Encounter Medications as of 02/09/2020  Medication Sig  . acetaminophen (TYLENOL) 500 MG tablet Take 1,000 mg by mouth every 8 (eight) hours as needed for headache.  . docusate sodium (COLACE) 100 MG capsule Take 100 mg by mouth 2 (two) times daily.  Marland Kitchen EPINEPHrine 0.3 mg/0.3 mL IJ SOAJ injection Inject 0.3 mLs (0.3 mg total) into the muscle as needed for anaphylaxis.  . ferrous sulfate 325 (65 FE) MG EC tablet Take 325 mg by mouth daily with breakfast.  . hydrocortisone (CORTEF) 10 MG tablet TAKE 2 TABLETS BY MOUTH AT 8AM (Patient taking differently: Take 20 mg by mouth in the morning. )  . levothyroxine (SYNTHROID) 75 MCG tablet TAKE 1 TABLET BY MOUTH BEFORE BREAKFAST EVERY DAY EXCEPT TAKE 1/2 TABLET 1 DAY A WEEK (Patient taking differently: Take 75 mcg by mouth daily before breakfast. )  . LINZESS 145 MCG CAPS capsule TAKE 1 CAPSULE BY MOUTH ONCE A DAY BEFORE BREAKFAST  . midodrine (PROAMATINE) 5 MG tablet TAKE 1 TABLET BY MOUTH TWICE DAILY WITH A  MEAL (Patient taking differently: Take 5 mg by mouth in the morning. TAKE 1 TABLET BY MOUTH TWICE DAILY WITH A MEAL)  . ondansetron (ZOFRAN) 4 MG tablet Take 1 tablet (4 mg total) by mouth daily as needed for nausea or vomiting.  . ondansetron (ZOFRAN-ODT) 8 MG disintegrating tablet DISSOLVE 1 TABLET BY MOUTH EVERY 8 HOURS AS NEEDED FOR NAUSEA OR VOMITING (Patient taking differently: Take 8 mg by mouth every 8 (eight) hours as needed for nausea or vomiting. )  . Oxycodone HCl 10 MG TABS Take one tablet po q 4 hrs prn pain. Max 6 per day  . Oxycodone HCl 10 MG TABS Take one tablet po q 4 hours prn pain. Max 6 per day  . Oxycodone HCl 10 MG TABS Take one tablet po q 4 hours prn pain. Max 6 per day  . pantoprazole (PROTONIX) 40 MG tablet TAKE 1 TABLET BY MOUTH ONCE DAILY (Patient taking differently: Take 40 mg by mouth daily. )  . predniSONE (DELTASONE) 5 MG tablet Take 5 mg by mouth daily.   . promethazine (PHENERGAN) 25 MG suppository Place 1 suppository (25 mg total) rectally every 6 (six) hours as needed for nausea or vomiting.  . promethazine (PHENERGAN) 25 MG tablet TAKE 1 TABLET BY MOUTH EVERY 8 HOURS AS NEEDED FOR NAUSEA OR VOMITING (Patient taking differently: Take 25 mg by mouth every 8 (eight) hours as needed for nausea or vomiting. )  . rosuvastatin (CRESTOR) 10 MG tablet Take 1 tablet (10 mg total) by mouth daily.  . Vitamin D, Ergocalciferol, (DRISDOL) 1.25 MG (50000 UNIT) CAPS capsule Take 1 capsule (50,000 Units total) by mouth every 7 (seven) days.   No facility-administered encounter medications on file as of 02/09/2020.   ALLERGIES: Allergies  Allergen Reactions  . Bee Venom Anaphylaxis  . Bactrim [Sulfamethoxazole-Trimethoprim] Nausea And Vomiting  . Codeine Itching  . Contrast Media [Iodinated Diagnostic Agents]     CT chest w/ contrast at OSH followed by SOB resolved w/ single dose steroids, never ENT swelling, intubation or pressors, has had multiple contrasted scans since   . Morphine And Related Other (See Comments)    Unknown reaction- pt gets sick, dizzy, and confused Intolerance, not an allergy, received this in the emergency department recently without a problem.    VACCINATION STATUS: Immunization History  Administered Date(s) Administered  .  Influenza Whole 06/27/2010, 07/28/2011  . Pneumococcal Conjugate-13 06/15/2014  . Pneumococcal Polysaccharide-23 06/10/2013    HPI Esperansa RAYLYN CARTON is 59 y.o. female who is engaged in telehealth via telephone for follow-up of adrenal insufficiency and hypothyroidism.   -Recently intractable nausea/ vomiting, she had to visit ER 2 separate occasions for IV steroid replacement.  She has no new complaints today.   -She insists that she cannot empty her parathyroid forms at home.  - Her history is complicated including sarcoidosis which she dealt with for more than the last 10 years.  She is on regular follow-up with her pulmonologist.  Over the years, her therapy included high-dose steroids, up to 60 mg daily of prednisone, for the last 4-6 years.  She is currently on maintenance dose of prednisone 5 mg p.o. daily.  For adrenal  insufficiency, she was initiated and kept on hydrocortisone . She is currently on 20 mg p.o. every morning and 10 mg p.o. daily at noon.   -She is more consistent taking her medications this time.  She also has hypothyroidism on levothyroxine 75 mcg p.o. every morning.   -She has significant cognitive deficit, may be forgetting her medications at times. -Her weight is stable since last visit.   - The details of her diagnosis with adrenal insufficiency  is not available to review. -She is advised to wear medic alert. She denies any history of tuberculosis, incarceration, nor overseas travel . -She denies family history of adrenal, pituitary dysfunction. She has family history of hypothyroidism in one sibling. - Denies any history of intra-abdominal injury. She is on hydrocodone for chronic  pain and inhaled bronchodilators. She is known to have orthostatic hypotension for which she is on midodrine.    Review of Systems Limited as above.  Objective:    BP 117/71   Pulse 61   Ht 5\' 10"  (1.778 m)   Wt 221 lb 6.4 oz (100.4 kg)   LMP 05/17/2016   BMI 31.77 kg/m   Wt Readings from Last 3 Encounters:  02/09/20 221 lb 6.4 oz (100.4 kg)  01/29/20 222 lb 14.2 oz (101.1 kg)  01/27/20 222 lb 14.2 oz (101.1 kg)    Physical Exam  Physical Exam- Limited  Constitutional:  She is stable, accompanied by her husband to clinic.  Body mass index is 31.77 kg/m. , not in acute distress, normal state of mind Eyes:  EOMI, no exophthalmos Neck: Supple Thyroid: No gross goiter Respiratory: Adequate breathing efforts Musculoskeletal: no gross deformities, strength intact in all four extremities, no gross restriction of joint movements Skin:  no rashes, no hyperemia Neurological: no tremor with outstretched hands,    CMP ( most recent) CMP     Component Value Date/Time   NA 140 01/29/2020 1052   NA 140 11/20/2019 1209   K 3.8 01/29/2020 1052   CL 100 01/29/2020 1052   CO2 29 01/29/2020 1052   GLUCOSE 89 01/29/2020 1052   BUN 13 01/29/2020 1052   BUN 11 11/20/2019 1209   CREATININE 0.91 01/29/2020 1052   CREATININE 0.85 08/26/2014 0831   CALCIUM 9.0 01/29/2020 1052   PROT 6.0 (L) 01/28/2020 0457   PROT 6.3 01/06/2020 0816   ALBUMIN 3.0 (L) 01/28/2020 0457   ALBUMIN 4.1 01/06/2020 0816   AST 15 01/28/2020 0457   ALT 15 01/28/2020 0457   ALKPHOS 55 01/28/2020 0457   BILITOT 0.4 01/28/2020 0457   BILITOT 0.3 01/06/2020 0816   GFRNONAA >60 01/29/2020 1052   GFRAA >60 01/29/2020  1052     Diabetic Labs (most recent): Lab Results  Component Value Date   HGBA1C 5.4 02/03/2019   HGBA1C 5.0 12/31/2016   HGBA1C 5.3 08/21/2016     Lipid Panel ( most recent) Lipid Panel     Component Value Date/Time   CHOL 213 (H) 01/06/2020 0816   TRIG 182 (H) 01/06/2020 0816   HDL  48 01/06/2020 0816   CHOLHDL 4.4 01/06/2020 0816   CHOLHDL 4.0 08/26/2014 0831   VLDL 31 08/26/2014 0831   LDLCALC 133 (H) 01/06/2020 0816      Lab Results  Component Value Date   TSH 0.689 11/20/2019   TSH 0.364 (L) 06/29/2019   TSH 0.746 02/03/2019   TSH 1.290 08/07/2018   TSH 2.280 04/17/2018   TSH 0.527 12/04/2017   TSH 0.242 (L) 10/22/2017   TSH 0.769 10/15/2017   TSH 0.545 08/09/2017   TSH 5.190 (H) 04/19/2017   FREET4 1.25 11/20/2019   FREET4 1.43 06/29/2019   FREET4 1.32 02/03/2019   FREET4 1.38 08/09/2017   FREET4 1.15 04/19/2017   FREET4 0.99 12/31/2016      Assessment & Plan:   1. Adrenal insufficiency ([place]) -I have reviewed her available records and clinically evaluated this patient. She does have multiple risk factors for adrenal insufficiency including exposure to high-dose steroids related to her sarcoidosis, as well as opioid therapy related to diffuse pain. -  She has taken prednisone up to 60 mg daily for up to 6 years prior to her diagnosis with adrenal insufficiency. -She is on a stable dose of hydrocortisone 20 mg a.m. and 10 mg daily at noon in addition to her prednisone 5 mg started related to her sarcoidosis.   she is advised to continue only with same steroid regimen.  She is advised not to skip that midday dose.    -She is still does not wear a medical alert bracelet she states she had , advised to wear medical alert .   -She will not be considered for work-up by withdrawal state supports, risk of adrenal crisis. -She does not have enough support to perform steroid injection at home. -She is advised to go to ER if she cannot keep her medications down due to intractable vomiting. - She will be evaluated for the need for mineralocorticoid replacement which may help to stabilize her blood pressure. She is currently on medodrine 5 mg by mouth twice a day. - I had a long discussion with her and her husband regarding steroids sick day rules, risk of  weight gain/diabetes and the need for periodic screening with A1c.  -Her last A1c was 5% indicating absence of prediabetes/diabetes.    2. Hypothyroidism:  -Her recent labs for thyroid function are consistent with appropriate replacement.  She is advised to continue levothyroxine 25 mcg p.o. daily before breakfast.     This was clearly communicated to the patient, and she voices understanding.   - We discussed about the correct intake of her thyroid hormone, on empty stomach at fasting, with water, separated by at least 30 minutes from breakfast and other medications,  and separated by more than 4 hours from calcium, iron, multivitamins, acid reflux medications (PPIs). -Patient is made aware of the fact that thyroid hormone replacement is needed for life, dose to be adjusted by periodic monitoring of thyroid function tests.   - I advised her  to maintain close follow up with [name], MD for primary care needs.      -  Time spent on this patient care encounter:  33 minutes of which 50% was spent in  counseling and the rest reviewing  her current and  previous labs / studies and medications  doses and developing a plan for long term care. Kathleen [name]  participated in the discussions, expressed understanding, and voiced agreement with the above plans.  All questions were answered to her satisfaction. she is encouraged to contact clinic should she have any questions or concerns prior to her return visit.    Follow up plan: Return for Keep Reg. Appt. with Pre-visit Labs.   Glade Lloyd, MD [facility] [address] [place], [place] [address] Phone: [phone]  Fax: [phone]     02/09/2020, 4:42 PM  This note was partially dictated with voice recognition software. Similar sounding words can be transcribed inadequately or may not  be corrected upon review.

## 2020-02-11 ENCOUNTER — Other Ambulatory Visit: Payer: Self-pay | Admitting: "Endocrinology

## 2020-02-11 ENCOUNTER — Telehealth: Payer: Self-pay | Admitting: "Endocrinology

## 2020-02-11 DIAGNOSIS — E2740 Unspecified adrenocortical insufficiency: Secondary | ICD-10-CM

## 2020-02-11 MED ORDER — LEVOTHYROXINE SODIUM 75 MCG PO TABS: 75.0000 ug | ORAL_TABLET | Freq: Every day | ORAL | 1 refills | Status: DC

## 2020-02-11 MED ORDER — HYDROCORTISONE NA SUCCINATE PF 250 MG IJ SOLR
250.0000 mg | Freq: Once | INTRAMUSCULAR | 2 refills | Status: DC | PRN
Start: 2020-02-11 — End: 2020-04-12

## 2020-02-11 MED ORDER — HYDROCORTISONE 10 MG PO TABS: ORAL_TABLET | ORAL | 1 refills | Status: DC

## 2020-02-11 MED FILL — HYDROCORTISONE 10 MG TABLET: 10 | 90 days supply | Qty: 270 | Fill #0

## 2020-02-11 NOTE — Telephone Encounter (Signed)
When pt was here, you mentioned her doing her own shots instead of gong to the hospital. She said she did not think she could do the shots, but her and her husband can do it. Will you call her in the shot to have on hand. She also needs a refill on her hydrocortisone (CORTEF) 10 MG tablet and levothyroxine (SYNTHROID) 75 MCG tablet. [place]

## 2020-02-11 NOTE — Telephone Encounter (Signed)
Done

## 2020-02-12 MED FILL — SOLU-CORTEF (PF) 250 MG VIA: 250 | 1 days supply | Qty: 1 | Fill #0

## 2020-02-16 ENCOUNTER — Other Ambulatory Visit: Payer: Self-pay | Admitting: *Deleted

## 2020-02-16 MED FILL — LEVOTHYROXINE 75 MCG TABLET: 75 | 90 days supply | Qty: 90 | Fill #0

## 2020-02-16 NOTE — Patient Outreach (Signed)
[facility]) Care Management  02/16/2020  Kathleen [name] Dec 27, 1960 [id number]   Transition of care /Case Closure Unsuccessful outreach    Referral received:01/28/20 Initial outreach:02/01/20 Insurance: UMR    Unable to complete post hospital discharge transition of care assessment. No return call form patient after 3 call attempts and no response to request to contact RN Care Coordinator in unsuccessful outreach letter mailed to home on 6/7 /21.  Objective: Per electronic record patient was hospitalized at [facility] 6/1-01/28/20 for Acute Adrenal Insufficiency, ED visit on 01/29/20 for Nausea and vomiting .  Past Medical history significant for : Adrenal insufficiency , sacroidosis, fibromyalgia, hyperlipidemia.  Plan Case closed to [facility] [employer] as it has been 10 days since initial post discharge outreach attempt.   Joylene Draft, RN, BSN  [place] Management Coordinator  [phone]- Mobile [phone]- Toll Free Main Office

## 2020-02-17 ENCOUNTER — Other Ambulatory Visit: Payer: Self-pay | Admitting: *Deleted

## 2020-02-17 DIAGNOSIS — M8580 Other specified disorders of bone density and structure, unspecified site: Secondary | ICD-10-CM

## 2020-02-17 DIAGNOSIS — M6281 Muscle weakness (generalized): Secondary | ICD-10-CM | POA: Diagnosis not present

## 2020-02-17 DIAGNOSIS — D860 Sarcoidosis of lung: Secondary | ICD-10-CM | POA: Diagnosis not present

## 2020-02-17 DIAGNOSIS — J849 Interstitial pulmonary disease, unspecified: Secondary | ICD-10-CM | POA: Diagnosis not present

## 2020-02-17 DIAGNOSIS — Z780 Asymptomatic menopausal state: Secondary | ICD-10-CM

## 2020-02-17 DIAGNOSIS — J9601 Acute respiratory failure with hypoxia: Secondary | ICD-10-CM | POA: Diagnosis not present

## 2020-02-23 ENCOUNTER — Other Ambulatory Visit: Payer: Self-pay

## 2020-02-23 ENCOUNTER — Ambulatory Visit (HOSPITAL_COMMUNITY)
Admission: RE | Admit: 2020-02-23 | Discharge: 2020-02-23 | Disposition: A | Discharge status: Home / Self Care | Payer: 59 | Source: Ambulatory Visit | Attending: Family Medicine | Admitting: Family Medicine

## 2020-02-23 DIAGNOSIS — M8580 Other specified disorders of bone density and structure, unspecified site: Secondary | ICD-10-CM | POA: Insufficient documentation

## 2020-02-23 DIAGNOSIS — Z780 Asymptomatic menopausal state: Secondary | ICD-10-CM | POA: Diagnosis not present

## 2020-02-23 DIAGNOSIS — R29890 Loss of height: Secondary | ICD-10-CM | POA: Diagnosis not present

## 2020-02-23 DIAGNOSIS — M8589 Other specified disorders of bone density and structure, multiple sites: Secondary | ICD-10-CM | POA: Diagnosis not present

## 2020-02-26 ENCOUNTER — Encounter: Payer: Self-pay | Admitting: Family Medicine

## 2020-02-26 ENCOUNTER — Other Ambulatory Visit: Payer: Self-pay

## 2020-02-26 ENCOUNTER — Ambulatory Visit (INDEPENDENT_AMBULATORY_CARE_PROVIDER_SITE_OTHER): Payer: 59 | Admitting: Family Medicine

## 2020-02-26 VITALS — BP 114/70 | Temp 97.6°F | Ht 70.0 in | Wt 223.0 lb

## 2020-02-26 DIAGNOSIS — E2740 Unspecified adrenocortical insufficiency: Secondary | ICD-10-CM | POA: Diagnosis not present

## 2020-02-26 DIAGNOSIS — L0291 Cutaneous abscess, unspecified: Secondary | ICD-10-CM | POA: Diagnosis not present

## 2020-02-26 MED ORDER — EPINEPHRINE 0.3 MG/0.3ML IJ SOAJ: 0.3000 mg | INTRAMUSCULAR | 2 refills | Status: DC | PRN

## 2020-02-26 MED ORDER — DOXYCYCLINE HYCLATE 100 MG PO TABS: 100.0000 mg | ORAL_TABLET | Freq: Two times a day (BID) | ORAL | 0 refills | Status: DC

## 2020-02-26 MED FILL — EPINEPHRINE 0.3 MG AUTO-INJ: 0.3 | 30 days supply | Qty: 2 | Fill #0

## 2020-02-26 MED FILL — DOXYCYCLINE HYCLATE 100 MG: 100 | 7 days supply | Qty: 14 | Fill #0

## 2020-02-26 NOTE — Progress Notes (Signed)
   Subjective:    Patient ID: Kathleen [name], female    DOB: 07/08/1961, 59 y.o.   MRN: [id number]  HPI4 week follow up.  Patient presents for follow-up.  She has not had any flareups of her adrenal insufficiency since last being seen.  She states that Dr. Dorris Fetch prescribed intravenous hydrocortisone in case she has a flareup with vomiting but she states she cannot do that is because it is called for IV.  I encourage her to talk with Dr. Dorris Fetch to see if this is something that can be given subcutaneous but I told the patient I was not certain.  Pt states she has another abscess under left arm.  Started up over the past few days.  Tender painful to the touch    Review of Systems Relates pain and discomfort from the abscess denies any nausea vomiting currently    Objective:   Physical Exam  Lungs clear respiratory rate normal heart regular no murmurs Abscess noted underneath the left arm Abscess was drained Timeout taken Protocol followed Packing placed 1% lidocaine with epi Patient did give consent      Assessment & Plan:  Skin abscess Iand D today Packing placed Patient will remove this tomorrow Doxycycline twice daily for 7 days Follow-up if progressive troubles  Adrenal insufficiency stable on current medications patient will clarify with her specialist regarding the backup plan if she gets nausea and vomiting

## 2020-02-29 MED FILL — oxyCODONE HCL 10 MG TABS: 10 | 30 days supply | Qty: 180 | Fill #0

## 2020-03-10 ENCOUNTER — Ambulatory Visit: Payer: 59 | Admitting: Family Medicine

## 2020-03-15 DIAGNOSIS — J849 Interstitial pulmonary disease, unspecified: Secondary | ICD-10-CM | POA: Diagnosis not present

## 2020-03-15 DIAGNOSIS — R0602 Shortness of breath: Secondary | ICD-10-CM | POA: Diagnosis not present

## 2020-03-18 DIAGNOSIS — M6281 Muscle weakness (generalized): Secondary | ICD-10-CM | POA: Diagnosis not present

## 2020-03-18 DIAGNOSIS — D860 Sarcoidosis of lung: Secondary | ICD-10-CM | POA: Diagnosis not present

## 2020-03-18 DIAGNOSIS — J849 Interstitial pulmonary disease, unspecified: Secondary | ICD-10-CM | POA: Diagnosis not present

## 2020-03-18 DIAGNOSIS — J9601 Acute respiratory failure with hypoxia: Secondary | ICD-10-CM | POA: Diagnosis not present

## 2020-03-21 ENCOUNTER — Other Ambulatory Visit: Payer: Self-pay | Admitting: Family Medicine

## 2020-03-21 MED FILL — MIDODRINE HCL 5 MG TABS: 5 | 30 days supply | Qty: 60 | Fill #3

## 2020-03-28 ENCOUNTER — Other Ambulatory Visit (HOSPITAL_COMMUNITY): Payer: Self-pay | Admitting: Internal Medicine

## 2020-03-28 DIAGNOSIS — R0602 Shortness of breath: Secondary | ICD-10-CM | POA: Diagnosis not present

## 2020-03-28 DIAGNOSIS — J849 Interstitial pulmonary disease, unspecified: Secondary | ICD-10-CM | POA: Diagnosis not present

## 2020-03-28 DIAGNOSIS — D860 Sarcoidosis of lung: Secondary | ICD-10-CM | POA: Diagnosis not present

## 2020-03-28 MED FILL — predniSONE 5 MG TABS: 5 | 90 days supply | Qty: 180 | Fill #0

## 2020-03-30 MED FILL — LINZESS 145 MCG CAPSULE: 145 | 90 days supply | Qty: 90 | Fill #0

## 2020-03-30 MED FILL — oxyCODONE HCL 10 MG TABS: 10 | 30 days supply | Qty: 180 | Fill #0

## 2020-04-04 DIAGNOSIS — M7062 Trochanteric bursitis, left hip: Secondary | ICD-10-CM | POA: Diagnosis not present

## 2020-04-07 ENCOUNTER — Encounter (HOSPITAL_COMMUNITY): Payer: Self-pay | Admitting: *Deleted

## 2020-04-07 ENCOUNTER — Emergency Department (HOSPITAL_COMMUNITY): Payer: 59

## 2020-04-07 ENCOUNTER — Emergency Department (HOSPITAL_COMMUNITY)
Admission: EM | Admit: 2020-04-07 | Discharge: 2020-04-07 | Disposition: A | Discharge status: Home / Self Care | Payer: 59 | Attending: Emergency Medicine | Admitting: Emergency Medicine

## 2020-04-07 ENCOUNTER — Other Ambulatory Visit: Payer: Self-pay

## 2020-04-07 DIAGNOSIS — Y998 Other external cause status: Secondary | ICD-10-CM | POA: Insufficient documentation

## 2020-04-07 DIAGNOSIS — M542 Cervicalgia: Secondary | ICD-10-CM | POA: Diagnosis not present

## 2020-04-07 DIAGNOSIS — Z79899 Other long term (current) drug therapy: Secondary | ICD-10-CM | POA: Insufficient documentation

## 2020-04-07 DIAGNOSIS — Y9289 Other specified places as the place of occurrence of the external cause: Secondary | ICD-10-CM | POA: Insufficient documentation

## 2020-04-07 DIAGNOSIS — M87852 Other osteonecrosis, left femur: Secondary | ICD-10-CM | POA: Diagnosis not present

## 2020-04-07 DIAGNOSIS — S161XXA Strain of muscle, fascia and tendon at neck level, initial encounter: Secondary | ICD-10-CM | POA: Diagnosis not present

## 2020-04-07 DIAGNOSIS — M545 Low back pain: Secondary | ICD-10-CM | POA: Diagnosis not present

## 2020-04-07 DIAGNOSIS — S79912A Unspecified injury of left hip, initial encounter: Secondary | ICD-10-CM | POA: Diagnosis not present

## 2020-04-07 DIAGNOSIS — S3992XA Unspecified injury of lower back, initial encounter: Secondary | ICD-10-CM | POA: Diagnosis present

## 2020-04-07 DIAGNOSIS — E039 Hypothyroidism, unspecified: Secondary | ICD-10-CM | POA: Diagnosis not present

## 2020-04-07 DIAGNOSIS — Y9389 Activity, other specified: Secondary | ICD-10-CM | POA: Insufficient documentation

## 2020-04-07 DIAGNOSIS — S39012A Strain of muscle, fascia and tendon of lower back, initial encounter: Secondary | ICD-10-CM | POA: Diagnosis not present

## 2020-04-07 MED ORDER — FENTANYL CITRATE (PF) 100 MCG/2ML IJ SOLN
50.0000 ug | Freq: Once | INTRAMUSCULAR | Status: AC
  Administered 2020-04-07: 50 ug via INTRAMUSCULAR
  Filled 2020-04-07: qty 2

## 2020-04-07 MED ORDER — METHOCARBAMOL 500 MG PO TABS
500.0000 mg | ORAL_TABLET | Freq: Three times a day (TID) | ORAL | 0 refills | Status: DC
Start: 2020-04-07 — End: 2021-02-21

## 2020-04-07 NOTE — Discharge Instructions (Addendum)
Your x-rays this evening did not show evidence of fractures or dislocations. Alternate ice and heat to your neck and lower back. Follow-up with your primary care provider for recheck. Return emergency department if you develop any worsening symptoms.
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
## 2020-04-12 ENCOUNTER — Other Ambulatory Visit: Payer: Self-pay

## 2020-04-12 ENCOUNTER — Ambulatory Visit (INDEPENDENT_AMBULATORY_CARE_PROVIDER_SITE_OTHER): Payer: 59 | Admitting: Family Medicine

## 2020-04-12 ENCOUNTER — Encounter: Payer: Self-pay | Admitting: Family Medicine

## 2020-04-12 ENCOUNTER — Other Ambulatory Visit: Payer: Self-pay | Admitting: Family Medicine

## 2020-04-12 VITALS — BP 118/74 | HR 83 | Temp 97.8°F | Ht 70.0 in | Wt 220.0 lb

## 2020-04-12 DIAGNOSIS — M546 Pain in thoracic spine: Secondary | ICD-10-CM

## 2020-04-12 DIAGNOSIS — R519 Headache, unspecified: Secondary | ICD-10-CM | POA: Diagnosis not present

## 2020-04-12 MED ORDER — OXYCODONE HCL 10 MG PO TABS: ORAL_TABLET | ORAL | 0 refills | Status: DC

## 2020-04-12 MED ORDER — ONDANSETRON 8 MG PO TBDP: 8.0000 mg | ORAL_TABLET | Freq: Three times a day (TID) | ORAL | 5 refills | Status: DC | PRN

## 2020-04-12 MED ORDER — ONDANSETRON HCL 4 MG PO TABS: 4.0000 mg | ORAL_TABLET | Freq: Every day | ORAL | 1 refills | Status: DC | PRN
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
## 2020-04-13 MED FILL — ONDANSETRON ODT 8 MG TABLET: 8 | 10 days supply | Qty: 30 | Fill #0

## 2020-04-13 MED FILL — ONDANSETRON HCL 4 MG TABS: 4 | 30 days supply | Qty: 30 | Fill #0

## 2020-04-18 DIAGNOSIS — J9601 Acute respiratory failure with hypoxia: Secondary | ICD-10-CM | POA: Diagnosis not present

## 2020-04-18 DIAGNOSIS — M6281 Muscle weakness (generalized): Secondary | ICD-10-CM | POA: Diagnosis not present

## 2020-04-18 DIAGNOSIS — J849 Interstitial pulmonary disease, unspecified: Secondary | ICD-10-CM | POA: Diagnosis not present

## 2020-04-18 DIAGNOSIS — D860 Sarcoidosis of lung: Secondary | ICD-10-CM | POA: Diagnosis not present

## 2020-04-18 MED FILL — PANTOPRAZOLE SOD DR 40 MG T: 40 | 90 days supply | Qty: 90 | Fill #0

## 2020-04-28 MED FILL — PANTOPRAZOLE SOD DR 40 MG T: 40 | 90 days supply | Qty: 90 | Fill #0

## 2020-04-28 MED FILL — MIDODRINE HCL 5 MG TABS: 5 | 30 days supply | Qty: 60 | Fill #4

## 2020-04-28 MED FILL — oxyCODONE HCL 10 MG TABS: 10 | 30 days supply | Qty: 180 | Fill #0

## 2020-04-30 ENCOUNTER — Emergency Department (HOSPITAL_COMMUNITY): Payer: 59

## 2020-04-30 ENCOUNTER — Encounter (HOSPITAL_COMMUNITY): Payer: Self-pay | Admitting: Emergency Medicine

## 2020-04-30 ENCOUNTER — Emergency Department (HOSPITAL_COMMUNITY)
Admission: EM | Admit: 2020-04-30 | Discharge: 2020-04-30 | Disposition: A | Discharge status: Home / Self Care | Payer: 59 | Attending: Emergency Medicine | Admitting: Emergency Medicine

## 2020-04-30 ENCOUNTER — Ambulatory Visit (INDEPENDENT_AMBULATORY_CARE_PROVIDER_SITE_OTHER)
Admission: EM | Admit: 2020-04-30 | Discharge: 2020-04-30 | Disposition: A | Discharge status: Home / Self Care | Payer: 59 | Source: Home / Self Care

## 2020-04-30 ENCOUNTER — Other Ambulatory Visit: Payer: Self-pay

## 2020-04-30 DIAGNOSIS — R0602 Shortness of breath: Secondary | ICD-10-CM | POA: Diagnosis not present

## 2020-04-30 DIAGNOSIS — Z20822 Contact with and (suspected) exposure to covid-19: Secondary | ICD-10-CM | POA: Diagnosis not present

## 2020-04-30 DIAGNOSIS — Z1152 Encounter for screening for COVID-19: Secondary | ICD-10-CM

## 2020-04-30 DIAGNOSIS — J069 Acute upper respiratory infection, unspecified: Secondary | ICD-10-CM | POA: Insufficient documentation

## 2020-04-30 DIAGNOSIS — R300 Dysuria: Secondary | ICD-10-CM

## 2020-04-30 DIAGNOSIS — J90 Pleural effusion, not elsewhere classified: Secondary | ICD-10-CM | POA: Diagnosis not present

## 2020-04-30 DIAGNOSIS — R079 Chest pain, unspecified: Secondary | ICD-10-CM | POA: Diagnosis not present

## 2020-04-30 DIAGNOSIS — Z5321 Procedure and treatment not carried out due to patient leaving prior to being seen by health care provider: Secondary | ICD-10-CM | POA: Diagnosis not present

## 2020-04-30 LAB — CBC
HCT: 38.5 % (ref 36.0–46.0)
Hemoglobin: 12.6 g/dL (ref 12.0–15.0)
MCH: 29.9 pg (ref 26.0–34.0)
MCHC: 32.7 g/dL (ref 30.0–36.0)
MCV: 91.2 fL (ref 80.0–100.0)
Platelets: 304 10*3/uL (ref 150–400)
RBC: 4.22 MIL/uL (ref 3.87–5.11)
RDW: 14.6 % (ref 11.5–15.5)
WBC: 7.9 10*3/uL (ref 4.0–10.5)
nRBC: 0 % (ref 0.0–0.2)

## 2020-04-30 LAB — POCT URINALYSIS DIP (MANUAL ENTRY)
Bilirubin, UA: NEGATIVE
Blood, UA: NEGATIVE
Glucose, UA: 100 mg/dL — AB
Ketones, POC UA: NEGATIVE mg/dL
Nitrite, UA: POSITIVE — AB
Protein Ur, POC: NEGATIVE mg/dL
Spec Grav, UA: 1.02 (ref 1.010–1.025)
Urobilinogen, UA: 1 E.U./dL
pH, UA: 5.5 (ref 5.0–8.0)

## 2020-04-30 LAB — BASIC METABOLIC PANEL
Anion gap: 10 (ref 5–15)
BUN: 14 mg/dL (ref 6–20)
CO2: 27 mmol/L (ref 22–32)
Calcium: 9.1 mg/dL (ref 8.9–10.3)
Chloride: 103 mmol/L (ref 98–111)
Creatinine, Ser: 0.85 mg/dL (ref 0.44–1.00)
GFR calc Af Amer: >60 mL/min (ref >60)
GFR calc non Af Amer: >60 mL/min (ref >60)
Glucose, Bld: 90 mg/dL (ref 70–99)
Potassium: 3.8 mmol/L (ref 3.5–5.1)
Sodium: 140 mmol/L (ref 135–145)

## 2020-04-30 LAB — TROPONIN I (HIGH SENSITIVITY): Troponin I (High Sensitivity): 2 ng/L (ref <18)

## 2020-04-30 MED ORDER — CEPHALEXIN 500 MG PO CAPS: 500.0000 mg | ORAL_CAPSULE | Freq: Four times a day (QID) | ORAL | 0 refills | Status: DC

## 2020-04-30 MED ORDER — PHENAZOPYRIDINE HCL 200 MG PO TABS: 200.0000 mg | ORAL_TABLET | Freq: Three times a day (TID) | ORAL | 0 refills | Status: DC

## 2020-04-30 MED ORDER — FLUTICASONE PROPIONATE 50 MCG/ACT NA SUSP: 2.0000 | Freq: Every day | NASAL | 0 refills | Status: DC

## 2020-04-30 MED ORDER — CETIRIZINE-PSEUDOEPHEDRINE ER 5-120 MG PO TB12: 1.0000 | ORAL_TABLET | Freq: Every day | ORAL | 0 refills | Status: DC

## 2020-04-30 MED ORDER — BENZONATATE 100 MG PO CAPS: 100.0000 mg | ORAL_CAPSULE | Freq: Three times a day (TID) | ORAL | 0 refills | Status: DC

## 2020-04-30 NOTE — ED Triage Notes (Signed)
Patient states chest pain that woke her up from her sleep. Patient also states shortness of breath but is on 2 liters of home oxygen. Patient had negative COVID test yesterday.

## 2020-04-30 NOTE — Discharge Instructions (Signed)
Reviewed blood work and chest x-ray results.  Everything within normal limits  COVID symptoms:  COVID testing ordered.  It will take between 5-7 days for test results.  Someone will contact you regarding abnormal results.   In the meantime: You should remain isolated in your home for 10 days from symptom onset AND greater than 72 hours after symptoms resolution (absence of fever without the use of fever-reducing medication and improvement in respiratory symptoms), whichever is longer Get plenty of rest and push fluids Tessalon Perles prescribed for cough Zyrtec D for nasal congestion and runny nose Use medications daily for symptom relief Use OTC medications like ibuprofen or tylenol as needed fever or pain Call or go to the ED if you have any new or worsening symptoms such as fever, worsening cough, shortness of breath, chest tightness, chest pain, turning blue, changes in mental status, etc...   UTI:  Urine culture sent.  We will call you with the results.   Push fluids and get plenty of rest.   Take antibiotic as directed and to completion Take pyridium as prescribed and as needed for symptomatic relief Follow up with PCP if symptoms persists Return here or go to ER if you have any new or worsening symptoms such as fever, worsening abdominal pain, nausea/vomiting, flank pain, etc..Marland Kitchen

## 2020-04-30 NOTE — ED Notes (Signed)
Pt is not in waiting room

## 2020-04-30 NOTE — ED Triage Notes (Signed)
Pt has c/o sore throat and feeling unwell, pt also states that she has had some dysuria as well. Pt went to ED but was unable to be seen due to wait time

## 2020-04-30 NOTE — ED Notes (Signed)
Patient placed on 2 liters of oxygen by nasal cannula.

## 2020-04-30 NOTE — ED Provider Notes (Signed)
[place]   [id number] 04/30/20 Arrival Time: 0805   CC: Multiple complaints  SUBJECTIVE: History from: patient.  Kathleen [name] is a 59 y.o. female hx significant for sarcoidosis, on oxygen at home, who presents with sore throat, headache, congestion, dry cough, SOB, and fatigue x 1 day .  Denies sick exposure to COVID, flu or strep.  Has tried OTC medications without relief.  Symptoms are made worse with activity.  Reports previous symptoms in the past with flu.  Denies previous COVID infection.  Did received COVID vaccines.   Denies fever, chills, rhinorrhea, wheezing, chest pain, nausea, vomiting, changes in bowel or bladder habits.    Did COVID home test that was negative.    Patient also complains of dysuria that began on couple days.  She denies a precipitating event or recent sexual encounter.  She has tried AZO without relief.  Her symptoms are made worse with urination.  She reports similar symptoms in the past with UTI.  She denies fever, chills, nausea, vomiting, abdominal pain, flank pain, hematuria, or incontinence   Seen in the ED this morning [facility].  Had blood work, EKG, and CXR.     ROS: As per HPI.  All other pertinent ROS negative.     Past Medical History:  Diagnosis Date   Adrenal insufficiency (HCC)    Anemia    Avascular bone necrosis ([place])    Breast fibrocystic disorder    Chronic pain    Encounter for long-term opiate analgesic use 02/02/2020   Patient has chronic pain with femoral head avascular necrosis.  Is under pain contract   Fibromyalgia    Gastroesophageal reflux disease    Mitral valve prolapse    Orthostatic hypotension    Peripheral neuropathy    Restrictive lung disease    Moderate to severe   Sarcoidosis    Biopsy proven - [facility]   SOB (shortness of breath)    chronic   Past Surgical History:  Procedure Laterality Date   BIOPSY  10/17/2018   Procedure: BIOPSY;  Surgeon: Rogene Houston, MD;  Location: [facility];  Service: Endoscopy;;  duodenum, antrum, gastric body   BREAST LUMPECTOMY  01/12/2011   right   ESOPHAGOGASTRODUODENOSCOPY (EGD) WITH PROPOFOL N/A 10/17/2018   Procedure: ESOPHAGOGASTRODUODENOSCOPY (EGD) WITH PROPOFOL;  Surgeon: Rogene Houston, MD;  Location: [facility];  Service: Endoscopy;  Laterality: N/A;  2:25   TUBAL LIGATION     Allergies  Allergen Reactions   Bee Venom Anaphylaxis   Bactrim [Sulfamethoxazole-Trimethoprim] Nausea And Vomiting   Codeine Itching   Contrast Media [Iodinated Diagnostic Agents]     CT chest w/ contrast at OSH followed by SOB resolved w/ single dose steroids, never ENT swelling, intubation or pressors, has had multiple contrasted scans since   Morphine And Related Other (See Comments)    Unknown reaction- pt gets sick, dizzy, and confused Intolerance, not an allergy, received this in the emergency department recently without a problem.   No current facility-administered medications on file prior to encounter.   Current Outpatient Medications on File Prior to Encounter  Medication Sig Dispense Refill   acetaminophen (TYLENOL) 500 MG tablet Take 1,000 mg by mouth every 8 (eight) hours as needed for headache.     docusate sodium (COLACE) 100 MG capsule Take 100 mg by mouth 2 (two) times daily.     EPINEPHrine 0.3 mg/0.3 mL IJ SOAJ injection Inject 0.3 mLs (0.3 mg total) into the muscle as needed  for anaphylaxis. 1 each 2   ferrous sulfate 325 (65 FE) MG EC tablet Take 325 mg by mouth daily with breakfast.     hydrocortisone (CORTEF) 10 MG tablet Take 2 tablets by mouth daily at 8AM, 1 tablet by mouth daily at noon. 270 tablet 1   levothyroxine (SYNTHROID) 75 MCG tablet Take 1 tablet (75 mcg total) by mouth daily before breakfast. 90 tablet 1   LINZESS 145 MCG CAPS capsule TAKE 1 CAPSULE BY MOUTH ONCE A DAY BEFORE BREAKFAST (Patient taking differently: Take 145 mcg by mouth daily before breakfast. ) 90 capsule 0   methocarbamol  (ROBAXIN) 500 MG tablet Take 1 tablet (500 mg total) by mouth 3 (three) times daily. 21 tablet 0   midodrine (PROAMATINE) 5 MG tablet TAKE 1 TABLET BY MOUTH TWICE DAILY WITH A MEAL (Patient taking differently: Take 5 mg by mouth in the morning. TAKE 1 TABLET BY MOUTH TWICE DAILY WITH A MEAL) 60 tablet 5   ondansetron (ZOFRAN) 4 MG tablet Take 1 tablet (4 mg total) by mouth daily as needed for nausea or vomiting. 30 tablet 1   ondansetron (ZOFRAN-ODT) 8 MG disintegrating tablet Take 1 tablet (8 mg total) by mouth every 8 (eight) hours as needed for nausea or vomiting. 30 tablet 5   Oxycodone HCl 10 MG TABS Take one tablet po q 4 hrs prn pain. Max 6 per day 180 tablet 0   Oxycodone HCl 10 MG TABS Take one tablet po q 4 hours prn pain. Max 6 per day 180 tablet 0   Oxycodone HCl 10 MG TABS Take one tablet po q 4 hours prn pain. Max 6 per day 180 tablet 0   pantoprazole (PROTONIX) 40 MG tablet TAKE 1 TABLET BY MOUTH ONCE DAILY 90 tablet 1   predniSONE (DELTASONE) 5 MG tablet Take 10 mg by mouth daily.     promethazine (PHENERGAN) 25 MG suppository Place 1 suppository (25 mg total) rectally every 6 (six) hours as needed for nausea or vomiting. 8 each 0   promethazine (PHENERGAN) 25 MG tablet TAKE 1 TABLET BY MOUTH EVERY 8 HOURS AS NEEDED FOR NAUSEA OR VOMITING (Patient taking differently: Take 25 mg by mouth every 8 (eight) hours as needed for nausea or vomiting. ) 25 tablet 3   rosuvastatin (CRESTOR) 10 MG tablet Take 1 tablet (10 mg total) by mouth daily. 90 tablet 1   Social History   Socioeconomic History   Marital status: Married    Spouse name: Not on file   Number of children: 1   Years of education: Not on file   Highest education level: Not on file  Occupational History   Occupation: Equities trader Payable    Employer: [employer]  Tobacco Use   Smoking status: Never Smoker   Smokeless tobacco: Never Used  Scientific laboratory technician Use: Never used  Substance and Sexual Activity    Alcohol use: No   Drug use: No   Sexual activity: Yes  Other Topics Concern   Not on file  Social History Narrative   Daily caffeine    Social Determinants of Health   Financial Resource Strain:    Difficulty of Paying Living Expenses: Not on file  Food Insecurity:    Worried About Charity fundraiser in the Last Year: Not on file   [employer] of Food in the Last Year: Not on file  Transportation Needs:    Lack of Transportation (Medical): Not on file  Lack of Transportation (Non-Medical): Not on file  Physical Activity:    Days of Exercise per Week: Not on file   Minutes of Exercise per Session: Not on file  Stress:    Feeling of Stress : Not on file  Social Connections:    Frequency of Communication with Friends and Family: Not on file   Frequency of Social Gatherings with Friends and Family: Not on file   Attends Religious Services: Not on file   Active Member of Clubs or Organizations: Not on file   Attends Archivist Meetings: Not on file   Marital Status: Not on file  Intimate Partner Violence:    Fear of Current or Ex-Partner: Not on file   Emotionally Abused: Not on file   Physically Abused: Not on file   Sexually Abused: Not on file   Family History  Problem Relation Age of Onset   Cardiomyopathy Mother    Breast cancer Mother    Prostate cancer Father    Neurofibromatosis Brother    Heart disease Maternal Aunt    Bipolar disorder Daughter    Colon cancer Neg Hx     OBJECTIVE:  Vitals:   04/30/20 0817  BP: 115/75  Pulse: 87  Resp: 18  Temp: 97.9 F (36.6 C)  SpO2: 94%     General appearance: alert; appears fatigued, but nontoxic; speaking in full sentences and tolerating own secretions HEENT: NCAT; Ears: EACs clear, TMs pearly gray; Eyes: PERRL.  EOM grossly intact. Nose: nares patent without rhinorrhea, Throat: oropharynx clear, tonsils non erythematous or enlarged, uvula midline  Neck: supple without  LAD Lungs: unlabored respirations, symmetrical air entry; cough: mild; no respiratory distress; CTAB Heart: regular rate and rhythm.   Abdomen: soft, nondistended, normal active bowel sounds; nontender to palpation; no guarding  Skin: warm and dry Psychological: alert and cooperative; normal mood and affect  LABS:  Results for orders placed or performed during the hospital encounter of 04/30/20 (from the past 24 hour(s))  POCT urinalysis dipstick     Status: Abnormal   Collection Time: 04/30/20  8:31 AM  Result Value Ref Range   Color, UA orange (A) yellow   Clarity, UA clear clear   Glucose, UA =100 (A) negative mg/dL   Bilirubin, UA negative negative   Ketones, POC UA negative negative mg/dL   Spec Grav, UA 1.020 1.010 - 1.025   Blood, UA negative negative   pH, UA 5.5 5.0 - 8.0   Protein Ur, POC negative negative mg/dL   Urobilinogen, UA 1.0 0.2 or 1.0 E.U./dL   Nitrite, UA Positive (A) Negative   Leukocytes, UA Small (1+) (A) Negative   Recent Results (from the past 2160 hour(s))  CBC     Status: None   Collection Time: 04/30/20  5:48 AM  Result Value Ref Range   WBC 7.9 4.0 - 10.5 K/uL   RBC 4.22 3.87 - 5.11 MIL/uL   Hemoglobin 12.6 12.0 - 15.0 g/dL   HCT 38.5 36 - 46 %   MCV 91.2 80.0 - 100.0 fL   MCH 29.9 26.0 - 34.0 pg   MCHC 32.7 30.0 - 36.0 g/dL   RDW 14.6 11.5 - 15.5 %   Platelets 304 150 - 400 K/uL   nRBC 0.0 0.0 - 0.2 %    Comment: Performed at [facility], [address], [place], [place] [address]  Basic metabolic panel     Status: None   Collection Time: 04/30/20  5:48 AM  Result  Value Ref Range   Sodium 140 135 - 145 mmol/L   Potassium 3.8 3.5 - 5.1 mmol/L   Chloride 103 98 - 111 mmol/L   CO2 27 22 - 32 mmol/L   Glucose, Bld 90 70 - 99 mg/dL    Comment: Glucose reference range applies only to samples taken after fasting for at least 8 hours.   BUN 14 6 - 20 mg/dL   Creatinine, Ser 0.85 0.44 - 1.00 mg/dL   Calcium 9.1 8.9 - 10.3 mg/dL   GFR calc  non Af Amer >60 >60 mL/min   GFR calc Af Amer >60 >60 mL/min   Anion gap 10 5 - 15    Comment: Performed at [facility], [address], [place], [place] [address]  Troponin I (High Sensitivity)     Status: None   Collection Time: 04/30/20  5:48 AM  Result Value Ref Range   Troponin I (High Sensitivity) 2 <18 ng/L    Comment: (NOTE) Elevated high sensitivity troponin I (hsTnI) values and significant  changes across serial measurements may suggest ACS but many other  chronic and acute conditions are known to elevate hsTnI results.  Refer to the "Links" section for chest pain algorithms and additional  guidance. Performed at [facility], [address] [place], [place] [address]   POCT urinalysis dipstick     Status: Abnormal   Collection Time: 04/30/20  8:31 AM  Result Value Ref Range   Color, UA orange (A) yellow   Clarity, UA clear clear   Glucose, UA =100 (A) negative mg/dL   Bilirubin, UA negative negative   Ketones, POC UA negative negative mg/dL   Spec Grav, UA 1.020 1.010 - 1.025   Blood, UA negative negative   pH, UA 5.5 5.0 - 8.0   Protein Ur, POC negative negative mg/dL   Urobilinogen, UA 1.0 0.2 or 1.0 E.U./dL   Nitrite, UA Positive (A) Negative   Leukocytes, UA Small (1+) (A) Negative   DIAGNOSTIC STUDIES:  DG Chest Portable 1 View  Result Date: 04/30/2020 CLINICAL DATA:  Chest pain EXAM: PORTABLE CHEST 1 VIEW COMPARISON:  11/02/2018 FINDINGS: Heart is normal size. Biapical scarring. No acute airspace opacities or effusions. No acute bony abnormality. IMPRESSION: Chronic changes.  No active disease. Electronically Signed   By: Rolm Baptise M.D.   On: 04/30/2020 03:40      ASSESSMENT & PLAN:  1. Encounter for screening for COVID-19   2. Viral URI with cough   3. Dysuria   4. Suspected COVID-19 virus infection     Meds ordered this encounter  Medications   cetirizine-pseudoephedrine (ZYRTEC-D) 5-120 MG tablet    Sig: Take 1 tablet by mouth daily.     Dispense:  30 tablet    Refill:  0    Order Specific Question:   Supervising Provider    Answer:   Raylene Everts [[id number]]   fluticasone (FLONASE) 50 MCG/ACT nasal spray    Sig: Place 2 sprays into both nostrils daily.    Dispense:  16 g    Refill:  0    Order Specific Question:   Supervising Provider    Answer:   Raylene Everts [[id number]]   benzonatate (TESSALON) 100 MG capsule    Sig: Take 1 capsule (100 mg total) by mouth every 8 (eight) hours.    Dispense:  21 capsule    Refill:  0    Order Specific Question:   Supervising Provider    Answer:  Blanchie Serve SUE [[id number]]   cephALEXin (KEFLEX) 500 MG capsule    Sig: Take 1 capsule (500 mg total) by mouth 4 (four) times daily.    Dispense:  20 capsule    Refill:  0    Order Specific Question:   Supervising Provider    Answer:   Raylene Everts [[id number]]   phenazopyridine (PYRIDIUM) 200 MG tablet    Sig: Take 1 tablet (200 mg total) by mouth 3 (three) times daily.    Dispense:  6 tablet    Refill:  0    Order Specific Question:   Supervising Provider    Answer:   Raylene Everts [[id number]]   Reviewed blood work and chest x-ray results.  Everything within normal limits  COVID symptoms:  COVID testing ordered.  It will take between 5-7 days for test results.  Someone will contact you regarding abnormal results.   In the meantime: You should remain isolated in your home for 10 days from symptom onset AND greater than 72 hours after symptoms resolution (absence of fever without the use of fever-reducing medication and improvement in respiratory symptoms), whichever is longer Get plenty of rest and push fluids Tessalon Perles prescribed for cough Zyrtec D for nasal congestion and runny nose Use medications daily for symptom relief Use OTC medications like ibuprofen or tylenol as needed fever or pain Call or go to the ED if you have any new or worsening symptoms such as fever, worsening cough, shortness of  breath, chest tightness, chest pain, turning blue, changes in mental status, etc...   UTI:  Urine culture sent.  We will call you with the results.   Push fluids and get plenty of rest.   Take antibiotic as directed and to completion Take pyridium as prescribed and as needed for symptomatic relief Follow up with PCP if symptoms persists Return here or go to ER if you have any new or worsening symptoms such as fever, worsening abdominal pain, nausea/vomiting, flank pain, etc...  Reviewed expectations re: course of current medical issues. Questions answered. Outlined signs and symptoms indicating need for more acute intervention. Patient verbalized understanding. After Visit Summary given.         Stacey Drain [place], PA-C 04/30/20 [phone]

## 2020-05-01 ENCOUNTER — Emergency Department (HOSPITAL_COMMUNITY)
Admission: EM | Admit: 2020-05-01 | Discharge: 2020-05-01 | Disposition: A | Discharge status: Home / Self Care | Payer: 59 | Attending: Emergency Medicine | Admitting: Emergency Medicine

## 2020-05-01 ENCOUNTER — Emergency Department (HOSPITAL_COMMUNITY): Payer: 59

## 2020-05-01 ENCOUNTER — Encounter (HOSPITAL_COMMUNITY): Payer: Self-pay

## 2020-05-01 ENCOUNTER — Other Ambulatory Visit: Payer: Self-pay

## 2020-05-01 DIAGNOSIS — E039 Hypothyroidism, unspecified: Secondary | ICD-10-CM | POA: Insufficient documentation

## 2020-05-01 DIAGNOSIS — R5383 Other fatigue: Secondary | ICD-10-CM | POA: Diagnosis not present

## 2020-05-01 DIAGNOSIS — R519 Headache, unspecified: Secondary | ICD-10-CM | POA: Insufficient documentation

## 2020-05-01 DIAGNOSIS — M791 Myalgia, unspecified site: Secondary | ICD-10-CM | POA: Insufficient documentation

## 2020-05-01 DIAGNOSIS — Z79890 Hormone replacement therapy (postmenopausal): Secondary | ICD-10-CM | POA: Diagnosis not present

## 2020-05-01 DIAGNOSIS — Z20822 Contact with and (suspected) exposure to covid-19: Secondary | ICD-10-CM | POA: Insufficient documentation

## 2020-05-01 DIAGNOSIS — R112 Nausea with vomiting, unspecified: Secondary | ICD-10-CM | POA: Insufficient documentation

## 2020-05-01 DIAGNOSIS — R05 Cough: Secondary | ICD-10-CM | POA: Diagnosis present

## 2020-05-01 DIAGNOSIS — J189 Pneumonia, unspecified organism: Secondary | ICD-10-CM | POA: Diagnosis not present

## 2020-05-01 DIAGNOSIS — R0602 Shortness of breath: Secondary | ICD-10-CM | POA: Insufficient documentation

## 2020-05-01 DIAGNOSIS — R109 Unspecified abdominal pain: Secondary | ICD-10-CM | POA: Diagnosis not present

## 2020-05-01 LAB — COMPREHENSIVE METABOLIC PANEL
ALT: 14 U/L (ref 0–44)
AST: 13 U/L — ABNORMAL LOW (ref 15–41)
Albumin: 3.7 g/dL (ref 3.5–5.0)
Alkaline Phosphatase: 63 U/L (ref 38–126)
Anion gap: 10 (ref 5–15)
BUN: 12 mg/dL (ref 6–20)
CO2: 25 mmol/L (ref 22–32)
Calcium: 8.9 mg/dL (ref 8.9–10.3)
Chloride: 101 mmol/L (ref 98–111)
Creatinine, Ser: 0.81 mg/dL (ref 0.44–1.00)
GFR calc Af Amer: >60 mL/min (ref >60)
GFR calc non Af Amer: >60 mL/min (ref >60)
Glucose, Bld: 119 mg/dL — ABNORMAL HIGH (ref 70–99)
Potassium: 4.1 mmol/L (ref 3.5–5.1)
Sodium: 136 mmol/L (ref 135–145)
Total Bilirubin: 0.8 mg/dL (ref 0.3–1.2)
Total Protein: 6.9 g/dL (ref 6.5–8.1)

## 2020-05-01 LAB — CBC WITH DIFFERENTIAL/PLATELET
Abs Immature Granulocytes: 0.07 10*3/uL (ref 0.00–0.07)
Basophils Absolute: 0 10*3/uL (ref 0.0–0.1)
Basophils Relative: 0 %
Eosinophils Absolute: 0.1 10*3/uL (ref 0.0–0.5)
Eosinophils Relative: 1 %
HCT: 39.2 % (ref 36.0–46.0)
Hemoglobin: 12.7 g/dL (ref 12.0–15.0)
Immature Granulocytes: 1 %
Lymphocytes Relative: 8 %
Lymphs Abs: 0.8 10*3/uL (ref 0.7–4.0)
MCH: 29.5 pg (ref 26.0–34.0)
MCHC: 32.4 g/dL (ref 30.0–36.0)
MCV: 91.2 fL (ref 80.0–100.0)
Monocytes Absolute: 0.6 10*3/uL (ref 0.1–1.0)
Monocytes Relative: 6 %
Neutro Abs: 8.5 10*3/uL — ABNORMAL HIGH (ref 1.7–7.7)
Neutrophils Relative %: 84 %
Platelets: 331 10*3/uL (ref 150–400)
RBC: 4.3 MIL/uL (ref 3.87–5.11)
RDW: 14.6 % (ref 11.5–15.5)
WBC: 10.1 10*3/uL (ref 4.0–10.5)
nRBC: 0 % (ref 0.0–0.2)

## 2020-05-01 LAB — BRAIN NATRIURETIC PEPTIDE: B Natriuretic Peptide: 68 pg/mL (ref 0.0–100.0)

## 2020-05-01 LAB — NOVEL CORONAVIRUS, NAA: SARS-CoV-2, NAA: NOT DETECTED

## 2020-05-01 LAB — SARS CORONAVIRUS 2 BY RT PCR (HOSPITAL ORDER, PERFORMED IN ~~LOC~~ HOSPITAL LAB): SARS Coronavirus 2: NEGATIVE

## 2020-05-01 MED ORDER — AMOXICILLIN-POT CLAVULANATE 875-125 MG PO TABS: 1.0000 | ORAL_TABLET | Freq: Two times a day (BID) | ORAL | 0 refills | Status: DC

## 2020-05-01 MED ORDER — AZITHROMYCIN 250 MG PO TABS
500.0000 mg | ORAL_TABLET | Freq: Once | ORAL | Status: AC
  Administered 2020-05-01: 500 mg via ORAL
  Filled 2020-05-01: qty 2

## 2020-05-01 MED ORDER — AZITHROMYCIN 250 MG PO TABS: ORAL_TABLET | ORAL | 0 refills | Status: DC

## 2020-05-01 MED ORDER — ONDANSETRON HCL 4 MG/2ML IJ SOLN
4.0000 mg | Freq: Once | INTRAMUSCULAR | Status: AC
  Administered 2020-05-01: 4 mg via INTRAVENOUS
  Filled 2020-05-01: qty 2

## 2020-05-01 MED ORDER — AMOXICILLIN-POT CLAVULANATE 875-125 MG PO TABS
1.0000 | ORAL_TABLET | Freq: Once | ORAL | Status: AC
  Administered 2020-05-01: 1 via ORAL
  Filled 2020-05-01: qty 1

## 2020-05-01 MED ORDER — HYDROCORTISONE NA SUCCINATE PF 100 MG IJ SOLR
100.0000 mg | Freq: Once | INTRAMUSCULAR | Status: AC
  Administered 2020-05-01: 100 mg via INTRAVENOUS
  Filled 2020-05-01: qty 2

## 2020-05-01 MED ORDER — ACETAMINOPHEN 325 MG PO TABS
650.0000 mg | ORAL_TABLET | Freq: Once | ORAL | Status: AC
  Administered 2020-05-01: 650 mg via ORAL
  Filled 2020-05-01: qty 2

## 2020-05-01 NOTE — ED Notes (Signed)
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

## 2020-05-01 NOTE — ED Provider Notes (Signed)
[facility] EMERGENCY DEPARTMENT Provider Note   CSN: [id number] Arrival date & time: 05/01/20  1230     History Chief Complaint  Patient presents with  . Cough    Kathleen [name] is a 59 y.o. female.  HPI      Kathleen [name] is a 59 y.o. female with past medical history for adrenal insufficiency, anemia, fibromyalgia, and sarcoidosis who presents to the Emergency Department complaining of cough, generalized body aches, fatigue and frontal headache.  Symptoms have been present for several days, gradually worsening.  Today, she states that she began having nausea and one episode of vomiting this morning.  She took her oral hydrocortisone and vomited shortly after.  She is concerned she may have vomited her medication up.  She is concerned she may have Covid although she has been fully vaccinated.  No known family members with Covid.  She denies fever, chills, dysuria, chest pain or shortness of breath.  She was seen yesterday at the local urgent care and treated for possible viral infection.   Past Medical History:  Diagnosis Date  . Adrenal insufficiency ([place])   . Anemia   . Avascular bone necrosis ([place])   . Breast fibrocystic disorder   . Chronic pain   . Encounter for long-term opiate analgesic use 02/02/2020   Patient has chronic pain with femoral head avascular necrosis.  Is under pain contract  . Fibromyalgia   . Gastroesophageal reflux disease   . Mitral valve prolapse   . Orthostatic hypotension   . Peripheral neuropathy   . Restrictive lung disease    Moderate to severe  . Sarcoidosis    Biopsy proven - [facility]  . SOB (shortness of breath)    chronic    Patient Active Problem List   Diagnosis Date Noted  . Encounter for long-term opiate analgesic use 02/02/2020  . Acute adrenal insufficiency ([place]) 01/27/2020  . Interstitial lung disease ([place]) 04/14/2019  . Non-intractable vomiting 09/22/2018  . Adrenal insufficiency (Addison's disease) ([place])   . Nausea vomiting  and diarrhea   . Acute adrenal crisis ([place]) 10/15/2017  . Gastroesophageal reflux disease 10/15/2017  . Personal history of noncompliance with medical treatment, presenting hazards to health 04/26/2017  . Hypothyroidism 01/15/2017  . Symptomatic bradycardia   . Acute respiratory failure with hypoxia ([place])   . Adrenal insufficiency ([place]) 08/17/2016  . Hypokalemia 08/17/2016  . Chronic pain syndrome 04/20/2016  . Vaginal bleeding 01/02/2016  . Hypovolemic shock ([place]) 12/30/2015  . Nausea and vomiting 12/29/2015  . Hypotension 12/29/2015  . Constipation 11/14/2015  . Avascular necrosis of bone of hip, left ([place]) 04/05/2015  . Bimalleolar fracture of right ankle 02/11/2015  . Post herpetic neuralgia 10/22/2014  . Hyperlipidemia 08/25/2014  . Obstructive sleep apnea 02/18/2014  . Obesity, unspecified 05/06/2013  . Fibromyalgia 02/11/2013  . Depression 02/11/2013  . Urinary retention 02/02/2013  . Pedal edema 01/22/2013  . Orthostatic hypotension 01/16/2013  . Helicobacter pylori gastritis 05/23/2012  . Fibrocystic breast disease in female 04/09/2012  . Anxiety state 10/19/2010  . CHEST PAIN 11/22/2008  . Sarcoidosis 06/21/2008    Past Surgical History:  Procedure Laterality Date  . BIOPSY  10/17/2018   Procedure: BIOPSY;  Surgeon: Rogene Houston, MD;  Location: [facility];  Service: Endoscopy;;  duodenum, antrum, gastric body  . BREAST LUMPECTOMY  01/12/2011   right  . ESOPHAGOGASTRODUODENOSCOPY (EGD) WITH PROPOFOL N/A 10/17/2018   Procedure: ESOPHAGOGASTRODUODENOSCOPY (EGD) WITH PROPOFOL;  Surgeon: Rogene Houston, MD;  Location: [facility];  Service: Endoscopy;  Laterality: N/A;  2:25  . TUBAL LIGATION       OB History    Gravida  1   Para  1   Term  1   Preterm      AB      Living        SAB      TAB      Ectopic      Multiple      Live Births              Family History  Problem Relation Age of Onset  . Cardiomyopathy Mother   . Breast  cancer Mother   . Prostate cancer Father   . Neurofibromatosis Brother   . Heart disease Maternal Aunt   . Bipolar disorder Daughter   . Colon cancer Neg Hx     Social History   Tobacco Use  . Smoking status: Never Smoker  . Smokeless tobacco: Never Used  Vaping Use  . Vaping Use: Never used  Substance Use Topics  . Alcohol use: No  . Drug use: No    Home Medications Prior to Admission medications   Medication Sig Start Date End Date Taking? Authorizing Provider  acetaminophen (TYLENOL) 500 MG tablet Take 1,000 mg by mouth every 8 (eight) hours as needed for headache.    [provider]  benzonatate (TESSALON) 100 MG capsule Take 1 capsule (100 mg total) by mouth every 8 (eight) hours. 04/30/20   Wurst, [place], PA-C  cephALEXin (KEFLEX) 500 MG capsule Take 1 capsule (500 mg total) by mouth 4 (four) times daily. 04/30/20   Wurst, [place], PA-C  cetirizine-pseudoephedrine (ZYRTEC-D) 5-120 MG tablet Take 1 tablet by mouth daily. 04/30/20   Wurst, [place], PA-C  docusate sodium (COLACE) 100 MG capsule Take 100 mg by mouth 2 (two) times daily.    [provider]  EPINEPHrine 0.3 mg/0.3 mL IJ SOAJ injection Inject 0.3 mLs (0.3 mg total) into the muscle as needed for anaphylaxis. 02/26/20   [name], MD  ferrous sulfate 325 (65 FE) MG EC tablet Take 325 mg by mouth daily with breakfast.    [provider]  fluticasone (FLONASE) 50 MCG/ACT nasal spray Place 2 sprays into both nostrils daily. 04/30/20   Wurst, [place], PA-C  hydrocortisone (CORTEF) 10 MG tablet Take 2 tablets by mouth daily at 8AM, 1 tablet by mouth daily at noon. 02/11/20   [name], MD  levothyroxine (SYNTHROID) 75 MCG tablet Take 1 tablet (75 mcg total) by mouth daily before breakfast. 02/11/20   Nida, Marella Chimes, MD  LINZESS 145 MCG CAPS capsule TAKE 1 CAPSULE BY MOUTH ONCE A DAY BEFORE BREAKFAST Patient taking differently: Take 145 mcg by mouth daily before breakfast.  02/03/20    [name], MD  methocarbamol (ROBAXIN) 500 MG tablet Take 1 tablet (500 mg total) by mouth 3 (three) times daily. 04/07/20   Emelia Sandoval, [place], PA-C  midodrine (PROAMATINE) 5 MG tablet TAKE 1 TABLET BY MOUTH TWICE DAILY WITH A MEAL Patient taking differently: Take 5 mg by mouth in the morning. TAKE 1 TABLET BY MOUTH TWICE DAILY WITH A MEAL 11/06/19   [name], MD  ondansetron (ZOFRAN) 4 MG tablet Take 1 tablet (4 mg total) by mouth daily as needed for nausea or vomiting. 04/12/20 04/12/21  [name], MD  ondansetron (ZOFRAN-ODT) 8 MG disintegrating tablet Take 1 tablet (8 mg total) by mouth every  8 (eight) hours as needed for nausea or vomiting. 04/12/20   [name], MD  Oxycodone HCl 10 MG TABS Take one tablet po q 4 hrs prn pain. Max 6 per day 01/29/20   [name], MD  Oxycodone HCl 10 MG TABS Take one tablet po q 4 hours prn pain. Max 6 per day 01/29/20   [name], MD  Oxycodone HCl 10 MG TABS Take one tablet po q 4 hours prn pain. Max 6 per day 04/12/20   [name], MD  pantoprazole (PROTONIX) 40 MG tablet TAKE 1 TABLET BY MOUTH ONCE DAILY 03/21/20   [name], MD  phenazopyridine (PYRIDIUM) 200 MG tablet Take 1 tablet (200 mg total) by mouth 3 (three) times daily. 04/30/20   Wurst, [place], PA-C  predniSONE (DELTASONE) 5 MG tablet Take 10 mg by mouth daily. 04/04/20   [provider]  promethazine (PHENERGAN) 25 MG suppository Place 1 suppository (25 mg total) rectally every 6 (six) hours as needed for nausea or vomiting. 01/29/20   [name], MD  promethazine (PHENERGAN) 25 MG tablet TAKE 1 TABLET BY MOUTH EVERY 8 HOURS AS NEEDED FOR NAUSEA OR VOMITING Patient taking differently: Take 25 mg by mouth every 8 (eight) hours as needed for nausea or vomiting.  04/28/19   [name], MD  rosuvastatin (CRESTOR) 10 MG tablet Take 1 tablet (10 mg total) by mouth daily. 01/14/20   [name], MD    Allergies    Bee venom, Bactrim  [sulfamethoxazole-trimethoprim], Codeine, Contrast media [iodinated diagnostic agents], and Morphine and related  Review of Systems   Review of Systems  Constitutional: Negative for appetite change, chills and fever.  Respiratory: Positive for cough. Negative for chest tightness and shortness of breath.   Cardiovascular: Negative for chest pain.  Gastrointestinal: Positive for abdominal pain, nausea and vomiting. Negative for diarrhea.  Genitourinary: Negative for difficulty urinating and dysuria.  Musculoskeletal: Positive for myalgias. Negative for arthralgias, back pain, joint swelling, neck pain and neck stiffness.  Skin: Negative for color change and wound.  Neurological: Positive for headaches. Negative for dizziness, syncope, weakness and numbness.    Physical Exam Updated Vital Signs BP 110/67 (BP Location: Right Arm)   Pulse 100   Temp 97.9 F (36.6 C) (Oral)   Resp (!) 26   Ht 5\' 10"  (1.778 m)   Wt 90.7 kg   LMP 05/17/2016   SpO2 94%   BMI 28.70 kg/m   Physical Exam Vitals and nursing note reviewed.  Constitutional:      Appearance: Normal appearance. She is not ill-appearing or toxic-appearing.  HENT:     Mouth/Throat:     Mouth: Mucous membranes are moist.  Cardiovascular:     Rate and Rhythm: Normal rate and regular rhythm.     Pulses: Normal pulses.  Pulmonary:     Effort: Pulmonary effort is normal.     Breath sounds: Normal breath sounds.  Chest:     Chest wall: No tenderness.  Abdominal:     General: There is no distension.     Palpations: Abdomen is soft.     Tenderness: There is abdominal tenderness. There is no right CVA tenderness or left CVA tenderness.  Musculoskeletal:     Right lower leg: No edema.     Left lower leg: No edema.  Skin:    General: Skin is warm.     Capillary Refill: Capillary refill takes less than 2 seconds.  Findings: No rash.  Neurological:     Mental Status: She is alert.     ED Results / Procedures /  Treatments   Labs (all labs ordered are listed, but only abnormal results are displayed) Labs Reviewed  CBC WITH DIFFERENTIAL/PLATELET - Abnormal; Notable for the following components:      Result Value   Neutro Abs 8.5 (*)    All other components within normal limits  COMPREHENSIVE METABOLIC PANEL - Abnormal; Notable for the following components:   Glucose, Bld 119 (*)    AST 13 (*)    All other components within normal limits  SARS CORONAVIRUS 2 BY RT PCR (HOSPITAL ORDER, [place] [facility])  BRAIN NATRIURETIC PEPTIDE    EKG None  Radiology DG Chest Portable 1 View  Result Date: 05/01/2020 CLINICAL DATA:  Cough, shortness of breath. EXAM: PORTABLE CHEST 1 VIEW COMPARISON:  April 30, 2020. FINDINGS: Stable cardiomediastinal silhouette. Mildly increased interstitial densities are noted in perihilar and basilar regions concerning for either atypical infection or possibly edema. No pneumothorax pleural effusion is noted. Bony thorax is unremarkable. IMPRESSION: Mildly increased perihilar and basilar interstitial densities are noted concerning for either atypical infection or possibly edema. Electronically Signed   By: Marijo Conception M.D.   On: 05/01/2020 14:56   DG Chest Portable 1 View  Result Date: 04/30/2020 CLINICAL DATA:  Chest pain EXAM: PORTABLE CHEST 1 VIEW COMPARISON:  11/02/2018 FINDINGS: Heart is normal size. Biapical scarring. No acute airspace opacities or effusions. No acute bony abnormality. IMPRESSION: Chronic changes.  No active disease. Electronically Signed   By: Rolm Baptise M.D.   On: 04/30/2020 03:40    Procedures Procedures (including critical care time)  Medications Ordered in ED Medications  hydrocortisone sodium succinate (SOLU-CORTEF) 100 MG injection 100 mg (has no administration in time range)  acetaminophen (TYLENOL) tablet 650 mg (has no administration in time range)  ondansetron (ZOFRAN) injection 4 mg (4 mg Intravenous Given  05/01/20 1556)    ED Course  I have reviewed the triage vital signs and the nursing notes.  Pertinent labs & imaging results that were available during my care of the patient were reviewed by me and considered in my medical decision making (see chart for details).    MDM Rules/Calculators/A&P                           Pt fully vaccinated here with generalized weakness, fatigue and cough.  Immunosuppressed d/t adrenal insufficiency.  Sx's concerning for covid. Pt is uncomfortable, but non-toxic appearing.  Vitals reassuring. Concerning that she vomited after taking her hydrocortisone  Lab interpreted by me, no leukocytosis, electrolytes reassuring.  covid testing negative.  CXR shows basilar densities which may be infectious, BNP unremarkable and densities felt to be less likely related to edema.  She has tolerated oral fluids w/o difficulty.  Resting comfortably.  No hypoxia.  No chest pain or dyspnea.  Doubt cardiac process or PE.  Feeling better after solu-cortef  rx for doxy and pt agrees to close f/u with PCP this week.  Return precautions given   Final Clinical Impression(s) / ED Diagnoses Final diagnoses:  Community acquired pneumonia, unspecified laterality    Rx / DC Orders ED Discharge Orders    None       Kaori, [place], PA-C 05/05/20 1421    Milton Ferguson, MD 05/06/20 [phone]

## 2020-05-01 NOTE — Discharge Instructions (Addendum)
Stop the cephalexin and start the 2 antibiotics that were prescribed today.  Continue to use your albuterol nebulizer, 1 treatment every 4-6 hours as needed.  Be sure to follow-up with Dr. Lance Sell office on Tuesday for recheck.  Return to the emergency department if you develop any worsening symptoms.  Your Covid test today was negative, you likely have a early developing pneumonia.

## 2020-05-01 NOTE — ED Triage Notes (Signed)
Pt to er, pt states that for the past several days she has had a headache, cough, body aches, and fatigue.  Pt states that she has been vaccinated for covid.

## 2020-05-01 NOTE — ED Notes (Signed)
Pt daughter called   reported that staff should have known mother was on O2 having read her old chart  She reports her mother is on O2 at night not 24/7  TT, PA in lengthy conversation w daughter regarding pt

## 2020-05-01 NOTE — ED Notes (Signed)
Pt spouse states pt needs more oxygen  Pt has been on room air   She reports she is on home oxygen   TT, PA is informed   Pt on O2/2L

## 2020-05-02 LAB — URINE CULTURE

## 2020-05-03 MED FILL — predniSONE 10 MG TABS: 10 | 5 days supply | Qty: 20 | Fill #0

## 2020-05-08 DIAGNOSIS — S39012A Strain of muscle, fascia and tendon of lower back, initial encounter: Secondary | ICD-10-CM | POA: Diagnosis not present

## 2020-05-08 DIAGNOSIS — S161XXA Strain of muscle, fascia and tendon at neck level, initial encounter: Secondary | ICD-10-CM | POA: Diagnosis not present

## 2020-05-11 ENCOUNTER — Other Ambulatory Visit: Payer: Self-pay

## 2020-05-11 ENCOUNTER — Encounter: Payer: Self-pay | Admitting: Family Medicine

## 2020-05-11 ENCOUNTER — Ambulatory Visit (INDEPENDENT_AMBULATORY_CARE_PROVIDER_SITE_OTHER): Payer: 59 | Admitting: Family Medicine

## 2020-05-11 VITALS — BP 134/78 | HR 91 | Temp 96.4°F | Wt 227.4 lb

## 2020-05-11 DIAGNOSIS — Z8701 Personal history of pneumonia (recurrent): Secondary | ICD-10-CM

## 2020-05-11 DIAGNOSIS — G894 Chronic pain syndrome: Secondary | ICD-10-CM

## 2020-05-11 DIAGNOSIS — Z79891 Long term (current) use of opiate analgesic: Secondary | ICD-10-CM

## 2020-05-11 LAB — MED LIST OPTION NOT SELECTED

## 2020-05-11 MED ORDER — OXYCODONE HCL 10 MG PO TABS: ORAL_TABLET | ORAL | 0 refills | Status: DC

## 2020-05-11 MED ORDER — OXYCODONE HCL 10 MG PO TABS
ORAL_TABLET | ORAL | 0 refills | Status: DC
Start: 2020-05-11 — End: 2020-08-25

## 2020-05-11 NOTE — Progress Notes (Signed)
Subjective:    Patient ID: Kathleen [name], female    DOB: October 30, 1960, 59 y.o.   MRN: [id number]  HPI Pt here for hospital follow up. Pt went to ER on 05/01/20 for pneumonia. Pt was having cough, congestion and headache. Pt states she is weak.  Still feels weak Having thick mucous No fever today Some vomiting No sweats Has hx chronic lung issues This patient was seen today for chronic pain  The medication list was reviewed and updated.   -Compliance with medication: Relates compliance  - Number patient states they take daily: 5 or 6 a day  -when was the last dose patient took?  Earlier today  The patient was advised the importance of maintaining medication and not using illegal substances with these.  Here for refills and follow up  The patient was educated that we can provide 3 monthly scripts for their medication, it is their responsibility to follow the instructions.  Side effects or complications from medications: Denies side effects with medication  Patient is aware that pain medications are meant to minimize the severity of the pain to allow their pain levels to improve to allow for better function. They are aware of that pain medications cannot totally remove their pain.  Due for UDT ( at least once per year) : Due today  Scale of 1 to 10 ( 1 is least 10 is most) Your pain level without the medicine: Relates its around the 8 Your pain level with medication around a 4  Scale 1 to 10 ( 1-helps very little, 10 helps very well) How well does your pain medication reduce your pain so you can function better through out the day?  7 or 8   The reason for her pain medicine is femoral head necrosis with severe chronic pain.  Unable to function without her pain medicine.  Has been on it for years.  Has been reliable with her medicines.   Review of Systems  Constitutional: Negative for activity change and fever.  HENT: Positive for congestion and rhinorrhea. Negative for ear  pain.   Eyes: Negative for discharge.  Respiratory: Positive for cough. Negative for shortness of breath and wheezing.   Cardiovascular: Negative for chest pain.       Objective:   Physical Exam Vitals and nursing note reviewed.  Constitutional:      Appearance: She is well-developed.  HENT:     Head: Normocephalic.     Nose: Nose normal.     Mouth/Throat:     Pharynx: No oropharyngeal exudate.  Cardiovascular:     Rate and Rhythm: Normal rate.     Heart sounds: Normal heart sounds. No murmur heard.   Pulmonary:     Effort: Pulmonary effort is normal.     Breath sounds: Normal breath sounds. No wheezing.  Musculoskeletal:     Cervical back: Neck supple.  Lymphadenopathy:     Cervical: No cervical adenopathy.  Skin:    General: Skin is warm and dry.    O2 95%       Assessment & Plan:  1. Chronic pain syndrome The patient was seen in followup for chronic pain. A review over at their current pain status was discussed. Drug registry was checked. Prescriptions were given.  Regular follow-up recommended. Discussion was held regarding the importance of compliance with medication as well as pain medication contract.  Patient was informed that medication may cause drowsiness and should not be combined  with other medications/alcohol or street  drugs. If the patient feels medication is causing altered alertness then do not drive or operate dangerous equipment.  Drug registry was checked.  3 scripts given.  Patient relates that pain medicine does allow her to function better - ToxASSURE Select 13 (MW), Urine  2. Encounter for long-term opiate analgesic use See above - ToxASSURE Select 13 (MW), Urine  3. History of bacterial pneumonia No need for follow-up x-rays.  Lungs sound good.  Minimizing risk of Covid was discussed.

## 2020-05-13 ENCOUNTER — Ambulatory Visit: Payer: Medicare Other | Admitting: Family Medicine

## 2020-05-13 LAB — SPECIMEN STATUS REPORT

## 2020-05-13 LAB — TOXASSURE SELECT 13 (MW), URINE

## 2020-05-16 MED FILL — HYDROCORTISONE 10 MG TABLET: 10 | 90 days supply | Qty: 270 | Fill #1

## 2020-05-19 DIAGNOSIS — J849 Interstitial pulmonary disease, unspecified: Secondary | ICD-10-CM | POA: Diagnosis not present

## 2020-05-19 DIAGNOSIS — M6281 Muscle weakness (generalized): Secondary | ICD-10-CM | POA: Diagnosis not present

## 2020-05-19 DIAGNOSIS — J9601 Acute respiratory failure with hypoxia: Secondary | ICD-10-CM | POA: Diagnosis not present

## 2020-05-19 DIAGNOSIS — D860 Sarcoidosis of lung: Secondary | ICD-10-CM | POA: Diagnosis not present

## 2020-05-24 ENCOUNTER — Other Ambulatory Visit: Payer: Self-pay | Admitting: "Endocrinology

## 2020-05-24 DIAGNOSIS — E039 Hypothyroidism, unspecified: Secondary | ICD-10-CM | POA: Diagnosis not present

## 2020-05-24 DIAGNOSIS — E2740 Unspecified adrenocortical insufficiency: Secondary | ICD-10-CM | POA: Diagnosis not present

## 2020-05-25 LAB — COMPREHENSIVE METABOLIC PANEL
ALT: 13 IU/L (ref 0–32)
AST: 12 IU/L (ref 0–40)
Albumin/Globulin Ratio: 1.7 (ref 1.2–2.2)
Albumin: 4.3 g/dL (ref 3.8–4.9)
Alkaline Phosphatase: 77 IU/L (ref 44–121)
BUN/Creatinine Ratio: 18 (ref 9–23)
BUN: 15 mg/dL (ref 6–24)
Bilirubin Total: 0.3 mg/dL (ref 0.0–1.2)
CO2: 23 mmol/L (ref 20–29)
Calcium: 9.4 mg/dL (ref 8.7–10.2)
Chloride: 101 mmol/L (ref 96–106)
Creatinine, Ser: 0.82 mg/dL (ref 0.57–1.00)
GFR calc Af Amer: 92 mL/min/{1.73_m2} (ref >59)
GFR calc non Af Amer: 80 mL/min/{1.73_m2} (ref >59)
Globulin, Total: 2.6 g/dL (ref 1.5–4.5)
Glucose: 99 mg/dL (ref 65–99)
Potassium: 5.2 mmol/L (ref 3.5–5.2)
Sodium: 139 mmol/L (ref 134–144)
Total Protein: 6.9 g/dL (ref 6.0–8.5)

## 2020-05-25 LAB — SPECIMEN STATUS REPORT

## 2020-05-25 LAB — T4, FREE: Free T4: 1.46 ng/dL (ref 0.82–1.77)

## 2020-05-25 LAB — TSH: TSH: 0.548 u[IU]/mL (ref 0.450–4.500)

## 2020-05-27 MED FILL — LEVOTHYROXINE 75 MCG TABLET: 75 | 90 days supply | Qty: 90 | Fill #1

## 2020-05-28 MED FILL — oxyCODONE HCL 10 MG TABS: 10 | 30 days supply | Qty: 180 | Fill #0

## 2020-06-02 ENCOUNTER — Telehealth (INDEPENDENT_AMBULATORY_CARE_PROVIDER_SITE_OTHER): Payer: 59 | Admitting: "Endocrinology

## 2020-06-02 ENCOUNTER — Encounter: Payer: Self-pay | Admitting: "Endocrinology

## 2020-06-02 DIAGNOSIS — E039 Hypothyroidism, unspecified: Secondary | ICD-10-CM

## 2020-06-02 DIAGNOSIS — E2740 Unspecified adrenocortical insufficiency: Secondary | ICD-10-CM

## 2020-06-02 NOTE — Progress Notes (Signed)
06/02/2020, 12:03 PM                                          Endocrinology Telehealth Visit Follow up Note -During COVID -19 Pandemic  This visit type was conducted  via telephone due to national recommendations for restrictions regarding the COVID-19 Pandemic  in an effort to limit this patient's exposure and mitigate transmission of the corona virus.   I connected with Kathleen [name] on 06/02/2020   by telephone and verified that I am speaking with the correct person using two identifiers. Kathleen [name], [id number]. she has verbally consented to this visit.  I was in my office and patient was in her residence. No other persons were with me during the encounter. All issues noted in this document were discussed and addressed. The format was not optimal for physical exam.   Subjective:    Patient ID: Kathleen [name], female    DOB: 02-Jul-1961, PCP [name], MD   Past Medical History:  Diagnosis Date  . Adrenal insufficiency ([place])   . Anemia   . Avascular bone necrosis ([place])   . Breast fibrocystic disorder   . Chronic pain   . Encounter for long-term opiate analgesic use 02/02/2020   Patient has chronic pain with femoral head avascular necrosis.  Is under pain contract  . Fibromyalgia   . Gastroesophageal reflux disease   . Mitral valve prolapse   . Orthostatic hypotension   . Peripheral neuropathy   . Restrictive lung disease    Moderate to severe  . Sarcoidosis    Biopsy proven - [facility]  . SOB (shortness of breath)    chronic   Past Surgical History:  Procedure Laterality Date  . BIOPSY  10/17/2018   Procedure: BIOPSY;  Surgeon: Rogene Houston, MD;  Location: [facility];  Service: Endoscopy;;  duodenum, antrum, gastric body  . BREAST LUMPECTOMY  01/12/2011   right  . ESOPHAGOGASTRODUODENOSCOPY (EGD) WITH PROPOFOL N/A 10/17/2018   Procedure: ESOPHAGOGASTRODUODENOSCOPY (EGD) WITH PROPOFOL;  Surgeon: Rogene Houston,  MD;  Location: [facility];  Service: Endoscopy;  Laterality: N/A;  2:25  . TUBAL LIGATION     Social History   Socioeconomic History  . Marital status: Married    Spouse name: Not on file  . Number of children: 1  . Years of education: Not on file  . Highest education level: Not on file  Occupational History  . Occupation: Teacher, early years/pre: [employer]  Tobacco Use  . Smoking status: Never Smoker  . Smokeless tobacco: Never Used  Vaping Use  . Vaping Use: Never used  Substance and Sexual Activity  . Alcohol use: No  . Drug use: No  . Sexual activity: Yes  Other Topics Concern  . Not on file  Social History Narrative   Daily caffeine    Social Determinants of Health   Financial Resource Strain:   . Difficulty of Paying Living Expenses: Not on file  Food Insecurity:   . Worried About Charity fundraiser in the Last Year: Not on  file  . [place] in the Last Year: Not on file  Transportation Needs:   . Lack of Transportation (Medical): Not on file  . Lack of Transportation (Non-Medical): Not on file  Physical Activity:   . Days of Exercise per Week: Not on file  . Minutes of Exercise per Session: Not on file  Stress:   . Feeling of Stress : Not on file  Social Connections:   . Frequency of Communication with Friends and Family: Not on file  . Frequency of Social Gatherings with Friends and Family: Not on file  . Attends Religious Services: Not on file  . Active Member of Clubs or Organizations: Not on file  . Attends Archivist Meetings: Not on file  . Marital Status: Not on file   Family History  Problem Relation Age of Onset  . Cardiomyopathy Mother   . Breast cancer Mother   . Prostate cancer Father   . Neurofibromatosis Brother   . Heart disease Maternal Aunt   . Bipolar disorder Daughter   . Colon cancer Neg Hx    Outpatient Encounter Medications as of 06/02/2020  Medication Sig  . acetaminophen (TYLENOL) 500 MG tablet  Take 1,000 mg by mouth every 8 (eight) hours as needed for headache.  . benzonatate (TESSALON) 100 MG capsule Take 1 capsule (100 mg total) by mouth every 8 (eight) hours.  . cetirizine-pseudoephedrine (ZYRTEC-D) 5-120 MG tablet Take 1 tablet by mouth daily.  Marland Kitchen docusate sodium (COLACE) 100 MG capsule Take 100 mg by mouth 2 (two) times daily.  Marland Kitchen EPINEPHrine 0.3 mg/0.3 mL IJ SOAJ injection Inject 0.3 mLs (0.3 mg total) into the muscle as needed for anaphylaxis.  . ferrous sulfate 325 (65 FE) MG EC tablet Take 325 mg by mouth daily with breakfast.  . fluticasone (FLONASE) 50 MCG/ACT nasal spray Place 2 sprays into both nostrils daily.  . hydrocortisone (CORTEF) 10 MG tablet Take 2 tablets by mouth daily at 8AM, 1 tablet by mouth daily at noon.  Marland Kitchen levothyroxine (SYNTHROID) 75 MCG tablet Take 1 tablet (75 mcg total) by mouth daily before breakfast.  . LINZESS 145 MCG CAPS capsule TAKE 1 CAPSULE BY MOUTH ONCE A DAY BEFORE BREAKFAST (Patient taking differently: Take 145 mcg by mouth daily before breakfast. )  . methocarbamol (ROBAXIN) 500 MG tablet Take 1 tablet (500 mg total) by mouth 3 (three) times daily.  . midodrine (PROAMATINE) 5 MG tablet TAKE 1 TABLET BY MOUTH TWICE DAILY WITH A MEAL (Patient taking differently: Take 5 mg by mouth in the morning. TAKE 1 TABLET BY MOUTH TWICE DAILY WITH A MEAL)  . ondansetron (ZOFRAN) 4 MG tablet Take 1 tablet (4 mg total) by mouth daily as needed for nausea or vomiting.  . ondansetron (ZOFRAN-ODT) 8 MG disintegrating tablet Take 1 tablet (8 mg total) by mouth every 8 (eight) hours as needed for nausea or vomiting.  . Oxycodone HCl 10 MG TABS Take one tablet po q 4 hrs prn pain. Max 6 per day  . Oxycodone HCl 10 MG TABS Take one tablet po q 4 hours prn pain. Max 6 per day  . Oxycodone HCl 10 MG TABS Take one tablet po q 4 hours prn pain. Max 6 per day  . pantoprazole (PROTONIX) 40 MG tablet TAKE 1 TABLET BY MOUTH ONCE DAILY  . phenazopyridine (PYRIDIUM) 200 MG  tablet Take 1 tablet (200 mg total) by mouth 3 (three) times daily.  . predniSONE (DELTASONE) 5 MG tablet  Take 10 mg by mouth daily.  . promethazine (PHENERGAN) 25 MG suppository Place 1 suppository (25 mg total) rectally every 6 (six) hours as needed for nausea or vomiting.  . promethazine (PHENERGAN) 25 MG tablet TAKE 1 TABLET BY MOUTH EVERY 8 HOURS AS NEEDED FOR NAUSEA OR VOMITING (Patient taking differently: Take 25 mg by mouth every 8 (eight) hours as needed for nausea or vomiting. )  . rosuvastatin (CRESTOR) 10 MG tablet Take 1 tablet (10 mg total) by mouth daily.   No facility-administered encounter medications on file as of 06/02/2020.   ALLERGIES: Allergies  Allergen Reactions  . Bee Venom Anaphylaxis  . Bactrim [Sulfamethoxazole-Trimethoprim] Nausea And Vomiting  . Codeine Itching  . Contrast Media [Iodinated Diagnostic Agents]     CT chest w/ contrast at OSH followed by SOB resolved w/ single dose steroids, never ENT swelling, intubation or pressors, has had multiple contrasted scans since  . Morphine And Related Other (See Comments)    Unknown reaction- pt gets sick, dizzy, and confused Intolerance, not an allergy, received this in the emergency department recently without a problem.    VACCINATION STATUS: Immunization History  Administered Date(s) Administered  . Influenza Whole 06/27/2010, 07/28/2011  . Moderna SARS-COVID-2 Vaccination 10/27/2019, 11/27/2019  . Pneumococcal Conjugate-13 06/15/2014  . Pneumococcal Polysaccharide-23 06/10/2013    HPI Anzleigh Mccain ISABELL is 58 y.o. female who is engaged in telehealth via telephone for follow-up of adrenal insufficiency and hypothyroidism.   -Patient has no new complaints, no ER visits since last visit.   - Her history is complicated including sarcoidosis which she dealt with for more than the last 10 years.  She is on regular follow-up with her pulmonologist.  Over the years, her therapy included high-dose steroids, up to 60 mg  daily of prednisone, for the last 4-6 years.  She is currently on maintenance dose of prednisone 5 mg p.o. daily.  For adrenal  insufficiency, she was initiated and kept on hydrocortisone . She is currently on 20 mg p.o. every morning and 10 mg p.o. daily every noon.     -She reports better consistency taking her medications this time.  She also has hypothyroidism on levothyroxine 75 mcg p.o. every morning.   -She has significant cognitive deficit. -Her weight is stable since last visit.   - The details of her diagnosis with adrenal insufficiency  is not available to review. -She is advised to wear medic alert. She denies any history of tuberculosis, incarceration, nor overseas travel . -She denies family history of adrenal, pituitary dysfunction. She has family history of hypothyroidism in one sibling. - Denies any history of intra-abdominal injury. She is on hydrocodone for chronic pain and inhaled bronchodilators. She is known to have orthostatic hypotension for which she is on midodrine.    Review of Systems Limited as above.  Objective:    LMP 05/17/2016   Wt Readings from Last 3 Encounters:  05/11/20 227 lb 6.4 oz (103.1 kg)  05/01/20 200 lb (90.7 kg)  04/12/20 220 lb (99.8 kg)    Physical Exam    CMP ( most recent) CMP     Component Value Date/Time   NA 139 05/24/2020 1038   K 5.2 05/24/2020 1038   CL 101 05/24/2020 1038   CO2 23 05/24/2020 1038   GLUCOSE 99 05/24/2020 1038   GLUCOSE 119 (H) 05/01/2020 1423   BUN 15 05/24/2020 1038   CREATININE 0.82 05/24/2020 1038   CREATININE 0.85 08/26/2014 0831   CALCIUM 9.4 05/24/2020  1038   PROT 6.9 05/24/2020 1038   ALBUMIN 4.3 05/24/2020 1038   AST 12 05/24/2020 1038   ALT 13 05/24/2020 1038   ALKPHOS 77 05/24/2020 1038   BILITOT 0.3 05/24/2020 1038   GFRNONAA 80 05/24/2020 1038   GFRAA 92 05/24/2020 1038     Diabetic Labs (most recent): Lab Results  Component Value Date   HGBA1C 5.4 02/03/2019   HGBA1C 5.0  12/31/2016   HGBA1C 5.3 08/21/2016     Lipid Panel ( most recent) Lipid Panel     Component Value Date/Time   CHOL 213 (H) 01/06/2020 0816   TRIG 182 (H) 01/06/2020 0816   HDL 48 01/06/2020 0816   CHOLHDL 4.4 01/06/2020 0816   CHOLHDL 4.0 08/26/2014 0831   VLDL 31 08/26/2014 0831   LDLCALC 133 (H) 01/06/2020 0816      Lab Results  Component Value Date   TSH 0.548 05/24/2020   TSH 0.689 11/20/2019   TSH 0.364 (L) 06/29/2019   TSH 0.746 02/03/2019   TSH 1.290 08/07/2018   TSH 2.280 04/17/2018   TSH 0.527 12/04/2017   TSH 0.242 (L) 10/22/2017   TSH 0.769 10/15/2017   TSH 0.545 08/09/2017   FREET4 1.46 05/24/2020   FREET4 1.25 11/20/2019   FREET4 1.43 06/29/2019   FREET4 1.32 02/03/2019   FREET4 1.38 08/09/2017   FREET4 1.15 04/19/2017   FREET4 0.99 12/31/2016      Assessment & Plan:   1. Adrenal insufficiency ([place]) -I have reviewed her available records and clinically evaluated this patient. She does have multiple risk factors for adrenal insufficiency  including exposure to high-dose steroids related to her sarcoidosis, as well as opioid therapy related to diffuse pain. -  She has taken prednisone up to 60 mg daily for up to 6 years prior to her diagnosis with adrenal insufficiency. -She is on a stable dose of hydrocortisone 20 mg a.m. and 10 mg daily at noon in addition to her prednisone 5 mg started related to her sarcoidosis.  She is advised to remain on same steroid replacement dose and schedule. She is advised not to skip that midday dose.    -She will not be considered for work-up by withdrawal state supports, risk of adrenal crisis. -She does not have enough support to perform steroid injection at home. -She is advised to go to ER if she cannot keep her medications down due to intractable vomiting. - She will be evaluated for the need for mineralocorticoid replacement which may help to stabilize her blood pressure. She is currently on medodrine 5 mg by mouth  twice a day. - I had a long discussion with her and her husband regarding steroids sick day rules, risk of weight gain/diabetes and the need for periodic screening with A1c.  -Her last A1c was 5% indicating absence of prediabetes/diabetes.    2. Hypothyroidism:  -Her recent labs for thyroid function are consistent with appropriate replacement.  She is advised to continue levothyroxine 75 mcg p.o. daily before breakfast.      This was clearly communicated to the patient, and she voices understanding.   - We discussed about the correct intake of her thyroid hormone, on empty stomach at fasting, with water, separated by at least 30 minutes from breakfast and other medications,  and separated by more than 4 hours from calcium, iron, multivitamins, acid reflux medications (PPIs). -Patient is made aware of the fact that thyroid hormone replacement is needed for life, dose to be adjusted by periodic monitoring  of thyroid function tests.    - I advised her  to maintain close follow up with [name], MD for primary care needs.     - Time spent on this patient care encounter:  20 minutes of which 50% was spent in  counseling and the rest reviewing  her current and  previous labs / studies and medications  doses and developing a plan for long term care. Kathleen [name]  participated in the discussions, expressed understanding, and voiced agreement with the above plans.  All questions were answered to her satisfaction. she is encouraged to contact clinic should she have any questions or concerns prior to her return visit.    Follow up plan: Return in about 6 months (around 12/01/2020) for F/U with Pre-visit Labs.   Glade Lloyd, MD [facility] [address] [place], [place] [address] Phone: [phone]  Fax: [phone]     06/02/2020, 12:03 PM  This note was partially dictated with voice recognition software. Similar sounding words can  be transcribed inadequately or may not  be corrected upon review.

## 2020-06-06 ENCOUNTER — Ambulatory Visit: Payer: 59 | Admitting: "Endocrinology

## 2020-06-10 ENCOUNTER — Ambulatory Visit (INDEPENDENT_AMBULATORY_CARE_PROVIDER_SITE_OTHER): Payer: 59 | Admitting: Obstetrics & Gynecology

## 2020-06-10 ENCOUNTER — Other Ambulatory Visit: Payer: Self-pay

## 2020-06-10 ENCOUNTER — Encounter: Payer: Self-pay | Admitting: Obstetrics & Gynecology

## 2020-06-10 VITALS — BP 129/74 | HR 92 | Ht 70.0 in | Wt 232.0 lb

## 2020-06-10 DIAGNOSIS — N9089 Other specified noninflammatory disorders of vulva and perineum: Secondary | ICD-10-CM | POA: Diagnosis not present

## 2020-06-10 NOTE — Progress Notes (Signed)
Chief Complaint  Patient presents with   Knot in vagina    slightly painful-noticed last week      59 y.o. G1P1000 Patient's last menstrual period was 05/17/2016. The current method of family planning is tubal ligation.  Outpatient Encounter Medications as of 06/10/2020  Medication Sig   acetaminophen (TYLENOL) 500 MG tablet Take 1,000 mg by mouth every 8 (eight) hours as needed for headache.   docusate sodium (COLACE) 100 MG capsule Take 100 mg by mouth 2 (two) times daily.   ferrous sulfate 325 (65 FE) MG EC tablet Take 325 mg by mouth daily with breakfast.   fluticasone (FLONASE) 50 MCG/ACT nasal spray Place 2 sprays into both nostrils daily.   hydrocortisone (CORTEF) 10 MG tablet Take 2 tablets by mouth daily at 8AM, 1 tablet by mouth daily at noon.   levothyroxine (SYNTHROID) 75 MCG tablet Take 1 tablet (75 mcg total) by mouth daily before breakfast.   LINZESS 145 MCG CAPS capsule TAKE 1 CAPSULE BY MOUTH ONCE A DAY BEFORE BREAKFAST (Patient taking differently: Take 145 mcg by mouth daily before breakfast. )   methocarbamol (ROBAXIN) 500 MG tablet Take 1 tablet (500 mg total) by mouth 3 (three) times daily.   midodrine (PROAMATINE) 5 MG tablet TAKE 1 TABLET BY MOUTH TWICE DAILY WITH A MEAL (Patient taking differently: Take 5 mg by mouth in the morning. TAKE 1 TABLET BY MOUTH TWICE DAILY WITH A MEAL)   ondansetron (ZOFRAN-ODT) 8 MG disintegrating tablet Take 1 tablet (8 mg total) by mouth every 8 (eight) hours as needed for nausea or vomiting.   Oxycodone HCl 10 MG TABS Take one tablet po q 4 hrs prn pain. Max 6 per day   Oxycodone HCl 10 MG TABS Take one tablet po q 4 hours prn pain. Max 6 per day   Oxycodone HCl 10 MG TABS Take one tablet po q 4 hours prn pain. Max 6 per day   pantoprazole (PROTONIX) 40 MG tablet TAKE 1 TABLET BY MOUTH ONCE DAILY   phenazopyridine (PYRIDIUM) 200 MG tablet Take 1 tablet (200 mg total) by mouth 3 (three) times daily.    predniSONE (DELTASONE) 5 MG tablet Take 10 mg by mouth daily.   promethazine (PHENERGAN) 25 MG suppository Place 1 suppository (25 mg total) rectally every 6 (six) hours as needed for nausea or vomiting.   promethazine (PHENERGAN) 25 MG tablet TAKE 1 TABLET BY MOUTH EVERY 8 HOURS AS NEEDED FOR NAUSEA OR VOMITING (Patient taking differently: Take 25 mg by mouth every 8 (eight) hours as needed for nausea or vomiting. )   rosuvastatin (CRESTOR) 10 MG tablet Take 1 tablet (10 mg total) by mouth daily.   cetirizine-pseudoephedrine (ZYRTEC-D) 5-120 MG tablet Take 1 tablet by mouth daily. (Patient not taking: Reported on 06/10/2020)   EPINEPHrine 0.3 mg/0.3 mL IJ SOAJ injection Inject 0.3 mLs (0.3 mg total) into the muscle as needed for anaphylaxis. (Patient not taking: Reported on 06/10/2020)   ondansetron (ZOFRAN) 4 MG tablet Take 1 tablet (4 mg total) by mouth daily as needed for nausea or vomiting. (Patient not taking: Reported on 06/10/2020)   [DISCONTINUED] benzonatate (TESSALON) 100 MG capsule Take 1 capsule (100 mg total) by mouth every 8 (eight) hours. (Patient not taking: Reported on 06/10/2020)   No facility-administered encounter medications on file as of 06/10/2020.    Subjective Pt felt a "bump" on the inside of her labia last week but thinks in the interim it has resolved  No pain  No drainage No discahrge  No fever or chills  Past Medical History:  Diagnosis Date   Adrenal insufficiency (HCC)    Anemia    Avascular bone necrosis (HCC)    Breast fibrocystic disorder    Chronic pain    Encounter for long-term opiate analgesic use 02/02/2020   Patient has chronic pain with femoral head avascular necrosis.  Is under pain contract   Fibromyalgia    Gastroesophageal reflux disease    Mitral valve prolapse    Orthostatic hypotension    Peripheral neuropathy    Restrictive lung disease    Moderate to severe   Sarcoidosis    Biopsy proven - [facility]   SOB (shortness  of breath)    chronic    Past Surgical History:  Procedure Laterality Date   BIOPSY  10/17/2018   Procedure: BIOPSY;  Surgeon: Rogene Houston, MD;  Location: [facility];  Service: Endoscopy;;  duodenum, antrum, gastric body   BREAST LUMPECTOMY  01/12/2011   right   ESOPHAGOGASTRODUODENOSCOPY (EGD) WITH PROPOFOL N/A 10/17/2018   Procedure: ESOPHAGOGASTRODUODENOSCOPY (EGD) WITH PROPOFOL;  Surgeon: Rogene Houston, MD;  Location: [facility];  Service: Endoscopy;  Laterality: N/A;  2:25   TUBAL LIGATION      OB History    Gravida  1   Para  1   Term  1   Preterm      AB      Living        SAB      TAB      Ectopic      Multiple      Live Births              Allergies  Allergen Reactions   Bee Venom Anaphylaxis   Bactrim [Sulfamethoxazole-Trimethoprim] Nausea And Vomiting   Codeine Itching   Contrast Media [Iodinated Diagnostic Agents]     CT chest w/ contrast at OSH followed by SOB resolved w/ single dose steroids, never ENT swelling, intubation or pressors, has had multiple contrasted scans since   Morphine And Related Other (See Comments)    Unknown reaction- pt gets sick, dizzy, and confused Intolerance, not an allergy, received this in the emergency department recently without a problem.    Social History   Socioeconomic History   Marital status: Married    Spouse name: Not on file   Number of children: 1   Years of education: Not on file   Highest education level: Not on file  Occupational History   Occupation: Equities trader Payable    Employer: [employer]  Tobacco Use   Smoking status: Never Smoker   Smokeless tobacco: Never Used  Scientific laboratory technician Use: Never used  Substance and Sexual Activity   Alcohol use: No   Drug use: No   Sexual activity: Not Currently    Birth control/protection: Post-menopausal  Other Topics Concern   Not on file  Social History Narrative   Daily caffeine    Social Determinants of  Health   Financial Resource Strain:    Difficulty of Paying Living Expenses: Not on file  Food Insecurity:    Worried About Charity fundraiser in the Last Year: Not on file   [employer] of Food in the Last Year: Not on file  Transportation Needs:    Lack of Transportation (Medical): Not on file   Lack of Transportation (Non-Medical): Not on file  Physical Activity:    Days  of Exercise per Week: Not on file   Minutes of Exercise per Session: Not on file  Stress:    Feeling of Stress : Not on file  Social Connections:    Frequency of Communication with Friends and Family: Not on file   Frequency of Social Gatherings with Friends and Family: Not on file   Attends Religious Services: Not on file   Active Member of Clubs or Organizations: Not on file   Attends Archivist Meetings: Not on file   Marital Status: Not on file    Family History  Problem Relation Age of Onset   Cardiomyopathy Mother    Breast cancer Mother    Prostate cancer Father    Neurofibromatosis Brother    Heart disease Maternal Aunt    Bipolar disorder Daughter    Colon cancer Neg Hx     Medications:       Current Outpatient Medications:    acetaminophen (TYLENOL) 500 MG tablet, Take 1,000 mg by mouth every 8 (eight) hours as needed for headache., Disp: , Rfl:    docusate sodium (COLACE) 100 MG capsule, Take 100 mg by mouth 2 (two) times daily., Disp: , Rfl:    ferrous sulfate 325 (65 FE) MG EC tablet, Take 325 mg by mouth daily with breakfast., Disp: , Rfl:    fluticasone (FLONASE) 50 MCG/ACT nasal spray, Place 2 sprays into both nostrils daily., Disp: 16 g, Rfl: 0   hydrocortisone (CORTEF) 10 MG tablet, Take 2 tablets by mouth daily at 8AM, 1 tablet by mouth daily at noon., Disp: 270 tablet, Rfl: 1   levothyroxine (SYNTHROID) 75 MCG tablet, Take 1 tablet (75 mcg total) by mouth daily before breakfast., Disp: 90 tablet, Rfl: 1   LINZESS 145 MCG CAPS capsule, TAKE 1 CAPSULE  BY MOUTH ONCE A DAY BEFORE BREAKFAST (Patient taking differently: Take 145 mcg by mouth daily before breakfast. ), Disp: 90 capsule, Rfl: 0   methocarbamol (ROBAXIN) 500 MG tablet, Take 1 tablet (500 mg total) by mouth 3 (three) times daily., Disp: 21 tablet, Rfl: 0   midodrine (PROAMATINE) 5 MG tablet, TAKE 1 TABLET BY MOUTH TWICE DAILY WITH A MEAL (Patient taking differently: Take 5 mg by mouth in the morning. TAKE 1 TABLET BY MOUTH TWICE DAILY WITH A MEAL), Disp: 60 tablet, Rfl: 5   ondansetron (ZOFRAN-ODT) 8 MG disintegrating tablet, Take 1 tablet (8 mg total) by mouth every 8 (eight) hours as needed for nausea or vomiting., Disp: 30 tablet, Rfl: 5   Oxycodone HCl 10 MG TABS, Take one tablet po q 4 hrs prn pain. Max 6 per day, Disp: 180 tablet, Rfl: 0   Oxycodone HCl 10 MG TABS, Take one tablet po q 4 hours prn pain. Max 6 per day, Disp: 180 tablet, Rfl: 0   Oxycodone HCl 10 MG TABS, Take one tablet po q 4 hours prn pain. Max 6 per day, Disp: 180 tablet, Rfl: 0   pantoprazole (PROTONIX) 40 MG tablet, TAKE 1 TABLET BY MOUTH ONCE DAILY, Disp: 90 tablet, Rfl: 1   phenazopyridine (PYRIDIUM) 200 MG tablet, Take 1 tablet (200 mg total) by mouth 3 (three) times daily., Disp: 6 tablet, Rfl: 0   predniSONE (DELTASONE) 5 MG tablet, Take 10 mg by mouth daily., Disp: , Rfl:    promethazine (PHENERGAN) 25 MG suppository, Place 1 suppository (25 mg total) rectally every 6 (six) hours as needed for nausea or vomiting., Disp: 8 each, Rfl: 0   promethazine (PHENERGAN) 25  MG tablet, TAKE 1 TABLET BY MOUTH EVERY 8 HOURS AS NEEDED FOR NAUSEA OR VOMITING (Patient taking differently: Take 25 mg by mouth every 8 (eight) hours as needed for nausea or vomiting. ), Disp: 25 tablet, Rfl: 3   rosuvastatin (CRESTOR) 10 MG tablet, Take 1 tablet (10 mg total) by mouth daily., Disp: 90 tablet, Rfl: 1   cetirizine-pseudoephedrine (ZYRTEC-D) 5-120 MG tablet, Take 1 tablet by mouth daily. (Patient not taking: Reported on  06/10/2020), Disp: 30 tablet, Rfl: 0   EPINEPHrine 0.3 mg/0.3 mL IJ SOAJ injection, Inject 0.3 mLs (0.3 mg total) into the muscle as needed for anaphylaxis. (Patient not taking: Reported on 06/10/2020), Disp: 1 each, Rfl: 2   ondansetron (ZOFRAN) 4 MG tablet, Take 1 tablet (4 mg total) by mouth daily as needed for nausea or vomiting. (Patient not taking: Reported on 06/10/2020), Disp: 30 tablet, Rfl: 1  Objective Blood pressure 129/74, pulse 92, height 5\' 10"  (1.778 m), weight 232 lb (105.2 kg), last menstrual period 05/17/2016.  Vulva and vagina without any bumps or abnormalities except for a skin tag in the crease of the left leg and vulva, likely from friction from the underwear  Skin tag removed sharply with iris scissors and cauterized with silver nitrate  Pertinent ROS No burning with urination, frequency or urgency No nausea, vomiting or diarrhea Nor fever chills or other constitutional symptoms   Labs or studies No new to review    Impression Diagnoses this Encounter::   ICD-10-CM   1. Skin tag of vulva  N90.89    left fold of the leg and vulva    Established relevant diagnosis(es):   Plan/Recommendations: No orders of the defined types were placed in this encounter.   Labs or Scans Ordered: No orders of the defined types were placed in this encounter.   Management:: Skin tag removed, single  Follow up Return if symptoms worsen or fail to improve.       All questions were answered.

## 2020-06-18 DIAGNOSIS — J9601 Acute respiratory failure with hypoxia: Secondary | ICD-10-CM | POA: Diagnosis not present

## 2020-06-18 DIAGNOSIS — M6281 Muscle weakness (generalized): Secondary | ICD-10-CM | POA: Diagnosis not present

## 2020-06-18 DIAGNOSIS — J849 Interstitial pulmonary disease, unspecified: Secondary | ICD-10-CM | POA: Diagnosis not present

## 2020-06-18 DIAGNOSIS — D860 Sarcoidosis of lung: Secondary | ICD-10-CM | POA: Diagnosis not present

## 2020-06-27 MED FILL — MIDODRINE HCL 5 MG TABS: 5 | 30 days supply | Qty: 60 | Fill #5

## 2020-06-27 MED FILL — oxyCODONE HCL 10 MG TABS: 10 | 30 days supply | Qty: 180 | Fill #0

## 2020-06-28 ENCOUNTER — Emergency Department (HOSPITAL_COMMUNITY)
Admission: EM | Admit: 2020-06-28 | Discharge: 2020-06-29 | Disposition: A | Discharge status: Home / Self Care | Payer: 59 | Attending: Emergency Medicine | Admitting: Emergency Medicine

## 2020-06-28 ENCOUNTER — Other Ambulatory Visit (HOSPITAL_COMMUNITY): Payer: Self-pay | Admitting: Physician Assistant

## 2020-06-28 ENCOUNTER — Ambulatory Visit
Admission: EM | Admit: 2020-06-28 | Discharge: 2020-06-28 | Disposition: A | Discharge status: Home / Self Care | Payer: No Typology Code available for payment source

## 2020-06-28 ENCOUNTER — Emergency Department (HOSPITAL_COMMUNITY): Payer: 59

## 2020-06-28 ENCOUNTER — Encounter (HOSPITAL_COMMUNITY): Payer: Self-pay

## 2020-06-28 ENCOUNTER — Other Ambulatory Visit: Payer: Self-pay

## 2020-06-28 DIAGNOSIS — Z9011 Acquired absence of right breast and nipple: Secondary | ICD-10-CM | POA: Insufficient documentation

## 2020-06-28 DIAGNOSIS — J069 Acute upper respiratory infection, unspecified: Secondary | ICD-10-CM | POA: Insufficient documentation

## 2020-06-28 DIAGNOSIS — E039 Hypothyroidism, unspecified: Secondary | ICD-10-CM | POA: Diagnosis not present

## 2020-06-28 DIAGNOSIS — U071 COVID-19: Secondary | ICD-10-CM | POA: Insufficient documentation

## 2020-06-28 DIAGNOSIS — D860 Sarcoidosis of lung: Secondary | ICD-10-CM | POA: Insufficient documentation

## 2020-06-28 DIAGNOSIS — R059 Cough, unspecified: Secondary | ICD-10-CM | POA: Diagnosis not present

## 2020-06-28 DIAGNOSIS — R0602 Shortness of breath: Secondary | ICD-10-CM | POA: Diagnosis not present

## 2020-06-28 DIAGNOSIS — Z885 Allergy status to narcotic agent status: Secondary | ICD-10-CM | POA: Insufficient documentation

## 2020-06-28 DIAGNOSIS — Z23 Encounter for immunization: Secondary | ICD-10-CM | POA: Diagnosis not present

## 2020-06-28 DIAGNOSIS — J90 Pleural effusion, not elsewhere classified: Secondary | ICD-10-CM | POA: Diagnosis not present

## 2020-06-28 DIAGNOSIS — Z79899 Other long term (current) drug therapy: Secondary | ICD-10-CM | POA: Insufficient documentation

## 2020-06-28 DIAGNOSIS — Z881 Allergy status to other antibiotic agents status: Secondary | ICD-10-CM | POA: Insufficient documentation

## 2020-06-28 DIAGNOSIS — E2740 Unspecified adrenocortical insufficiency: Secondary | ICD-10-CM | POA: Diagnosis not present

## 2020-06-28 DIAGNOSIS — B9789 Other viral agents as the cause of diseases classified elsewhere: Secondary | ICD-10-CM | POA: Diagnosis not present

## 2020-06-28 LAB — CBC WITH DIFFERENTIAL/PLATELET
Abs Immature Granulocytes: 0.09 10*3/uL — ABNORMAL HIGH (ref 0.00–0.07)
Basophils Absolute: 0 10*3/uL (ref 0.0–0.1)
Basophils Relative: 1 %
Eosinophils Absolute: 0 10*3/uL (ref 0.0–0.5)
Eosinophils Relative: 1 %
HCT: 38.8 % (ref 36.0–46.0)
Hemoglobin: 12.6 g/dL (ref 12.0–15.0)
Immature Granulocytes: 2 %
Lymphocytes Relative: 13 %
Lymphs Abs: 0.8 10*3/uL (ref 0.7–4.0)
MCH: 30 pg (ref 26.0–34.0)
MCHC: 32.5 g/dL (ref 30.0–36.0)
MCV: 92.4 fL (ref 80.0–100.0)
Monocytes Absolute: 0.8 10*3/uL (ref 0.1–1.0)
Monocytes Relative: 13 %
Neutro Abs: 4.4 10*3/uL (ref 1.7–7.7)
Neutrophils Relative %: 70 %
Platelets: 326 10*3/uL (ref 150–400)
RBC: 4.2 MIL/uL (ref 3.87–5.11)
RDW: 14.3 % (ref 11.5–15.5)
WBC: 6.1 10*3/uL (ref 4.0–10.5)
nRBC: 0 % (ref 0.0–0.2)

## 2020-06-28 LAB — COMPREHENSIVE METABOLIC PANEL
ALT: 17 U/L (ref 0–44)
AST: 13 U/L — ABNORMAL LOW (ref 15–41)
Albumin: 3.9 g/dL (ref 3.5–5.0)
Alkaline Phosphatase: 62 U/L (ref 38–126)
Anion gap: 11 (ref 5–15)
BUN: 10 mg/dL (ref 6–20)
CO2: 29 mmol/L (ref 22–32)
Calcium: 9.6 mg/dL (ref 8.9–10.3)
Chloride: 100 mmol/L (ref 98–111)
Creatinine, Ser: 0.95 mg/dL (ref 0.44–1.00)
GFR, Estimated: >60 mL/min (ref >60)
Glucose, Bld: 94 mg/dL (ref 70–99)
Potassium: 3.7 mmol/L (ref 3.5–5.1)
Sodium: 140 mmol/L (ref 135–145)
Total Bilirubin: 0.6 mg/dL (ref 0.3–1.2)
Total Protein: 7 g/dL (ref 6.5–8.1)

## 2020-06-28 LAB — RESPIRATORY PANEL BY RT PCR (FLU A&B, COVID)
Influenza A by PCR: NEGATIVE
Influenza B by PCR: NEGATIVE
SARS Coronavirus 2 by RT PCR: POSITIVE — AB

## 2020-06-28 LAB — TROPONIN I (HIGH SENSITIVITY)
Troponin I (High Sensitivity): <2 ng/L (ref <18)
Troponin I (High Sensitivity): <2 ng/L (ref <18)

## 2020-06-28 LAB — I-STAT BETA HCG BLOOD, ED (MC, WL, AP ONLY): I-stat hCG, quantitative: <5 m[IU]/mL (ref <5)

## 2020-06-28 MED ORDER — SODIUM CHLORIDE 0.9 % IV SOLN: 1200.0000 mg | Freq: Once | INTRAVENOUS | Status: DC

## 2020-06-28 MED ORDER — METHYLPREDNISOLONE SODIUM SUCC 125 MG IJ SOLR: 125.0000 mg | Freq: Once | INTRAMUSCULAR | Status: DC | PRN

## 2020-06-28 MED ORDER — DIPHENHYDRAMINE HCL 50 MG/ML IJ SOLN: 50.0000 mg | Freq: Once | INTRAMUSCULAR | Status: DC | PRN

## 2020-06-28 MED ORDER — SODIUM CHLORIDE 0.9 % IV SOLN
Freq: Once | INTRAVENOUS | Status: AC
  Filled 2020-06-28: qty 20

## 2020-06-28 MED ORDER — ACETAMINOPHEN 325 MG PO TABS
650.0000 mg | ORAL_TABLET | Freq: Once | ORAL | Status: AC
  Administered 2020-06-28: 650 mg via ORAL
  Filled 2020-06-28: qty 2

## 2020-06-28 MED ORDER — EPINEPHRINE 0.3 MG/0.3ML IJ SOAJ: 0.3000 mg | Freq: Once | INTRAMUSCULAR | Status: DC | PRN

## 2020-06-28 MED ORDER — GUAIFENESIN 100 MG/5ML PO SOLN
15.0000 mL | Freq: Once | ORAL | Status: AC
  Administered 2020-06-28: 300 mg via ORAL
  Filled 2020-06-28: qty 10

## 2020-06-28 MED ORDER — BENZONATATE 100 MG PO CAPS: 100.0000 mg | ORAL_CAPSULE | Freq: Three times a day (TID) | ORAL | 0 refills | Status: DC | PRN

## 2020-06-28 MED ORDER — SODIUM CHLORIDE 0.9 % IV SOLN: INTRAVENOUS | Status: DC | PRN

## 2020-06-28 MED ORDER — FAMOTIDINE IN NACL 20-0.9 MG/50ML-% IV SOLN: 20.0000 mg | Freq: Once | INTRAVENOUS | Status: DC | PRN

## 2020-06-28 MED ORDER — ALBUTEROL SULFATE HFA 108 (90 BASE) MCG/ACT IN AERS: 2.0000 | INHALATION_SPRAY | Freq: Once | RESPIRATORY_TRACT | Status: DC | PRN

## 2020-06-28 NOTE — Discharge Instructions (Addendum)
I prescribed you a medication called Tessalon Perles.  You can take these up to 3 times per day for your cough.  I would recommend taking Tylenol for fevers and headache.  Please follow the instructions on the bottle.  If you develop any new or worsening symptoms please return to the ER for re-evaluation.  It was a pleasure to meet you.

## 2020-06-28 NOTE — ED Notes (Signed)
Pt ambulated in room O2 stat at 93 . Pt stated "when I am home it normally drops to 88%

## 2020-06-28 NOTE — ED Provider Notes (Signed)
[facility] DEPT Provider Note   CSN: [id number] Arrival date & time: 06/28/20  1859     History No chief complaint on file.   Kathleen [name] is a 59 y.o. female.  HPI Patient is a 59 year old female with history of adrenal insufficiency, sarcoidosis, chronic shortness of breath, MVP, chronic pain, who presents to the emergency department with cough.  Patient states her symptoms started yesterday.  Her cough is dry.  She reports associated congestion, sore throat, voice change, chest tightness, worsening shortness of breath, moderate diffuse HA.  Denies any known sick contacts.  States that she was vaccinated for COVID-19 in April of this year with both does of the Moderna vaccine.  No nausea, vomiting, abdominal pain, urinary changes, syncope.    Past Medical History:  Diagnosis Date  . Adrenal insufficiency ([place])   . Anemia   . Avascular bone necrosis ([place])   . Breast fibrocystic disorder   . Chronic pain   . Encounter for long-term opiate analgesic use 02/02/2020   Patient has chronic pain with femoral head avascular necrosis.  Is under pain contract  . Fibromyalgia   . Gastroesophageal reflux disease   . Mitral valve prolapse   . Orthostatic hypotension   . Peripheral neuropathy   . Restrictive lung disease    Moderate to severe  . Sarcoidosis    Biopsy proven - [facility]  . SOB (shortness of breath)    chronic    Patient Active Problem List   Diagnosis Date Noted  . Encounter for long-term opiate analgesic use 02/02/2020  . Acute adrenal insufficiency ([place]) 01/27/2020  . Interstitial lung disease ([place]) 04/14/2019  . Non-intractable vomiting 09/22/2018  . Adrenal insufficiency (Addison's disease) ([place])   . Nausea vomiting and diarrhea   . Acute adrenal crisis ([place]) 10/15/2017  . Gastroesophageal reflux disease 10/15/2017  . Personal history of noncompliance with medical treatment, presenting hazards to health 04/26/2017  . Hypothyroidism  01/15/2017  . Symptomatic bradycardia   . Acute respiratory failure with hypoxia ([place])   . Adrenal insufficiency ([place]) 08/17/2016  . Hypokalemia 08/17/2016  . Chronic pain syndrome 04/20/2016  . Vaginal bleeding 01/02/2016  . Hypovolemic shock ([place]) 12/30/2015  . Nausea and vomiting 12/29/2015  . Hypotension 12/29/2015  . Constipation 11/14/2015  . Avascular necrosis of bone of hip, left ([place]) 04/05/2015  . Bimalleolar fracture of right ankle 02/11/2015  . Post herpetic neuralgia 10/22/2014  . Hyperlipidemia 08/25/2014  . Obstructive sleep apnea 02/18/2014  . Obesity, unspecified 05/06/2013  . Fibromyalgia 02/11/2013  . Depression 02/11/2013  . Urinary retention 02/02/2013  . Pedal edema 01/22/2013  . Orthostatic hypotension 01/16/2013  . Helicobacter pylori gastritis 05/23/2012  . Fibrocystic breast disease in female 04/09/2012  . Anxiety state 10/19/2010  . CHEST PAIN 11/22/2008  . Sarcoidosis 06/21/2008    Past Surgical History:  Procedure Laterality Date  . BIOPSY  10/17/2018   Procedure: BIOPSY;  Surgeon: Rogene Houston, MD;  Location: [facility];  Service: Endoscopy;;  duodenum, antrum, gastric body  . BREAST LUMPECTOMY  01/12/2011   right  . ESOPHAGOGASTRODUODENOSCOPY (EGD) WITH PROPOFOL N/A 10/17/2018   Procedure: ESOPHAGOGASTRODUODENOSCOPY (EGD) WITH PROPOFOL;  Surgeon: Rogene Houston, MD;  Location: [facility];  Service: Endoscopy;  Laterality: N/A;  2:25  . TUBAL LIGATION       OB History    Gravida  1   Para  1   Term  1   Preterm  AB      Living        SAB      TAB      Ectopic      Multiple      Live Births              Family History  Problem Relation Age of Onset  . Cardiomyopathy Mother   . Breast cancer Mother   . Prostate cancer Father   . Neurofibromatosis Brother   . Heart disease Maternal Aunt   . Bipolar disorder Daughter   . Colon cancer Neg Hx     Social History   Tobacco Use  . Smoking status:  Never Smoker  . Smokeless tobacco: Never Used  Vaping Use  . Vaping Use: Never used  Substance Use Topics  . Alcohol use: No  . Drug use: No    Home Medications Prior to Admission medications   Medication Sig Start Date End Date Taking? Authorizing Provider  acetaminophen (TYLENOL) 500 MG tablet Take 1,000 mg by mouth every 8 (eight) hours as needed for headache.    [provider]  cetirizine-pseudoephedrine (ZYRTEC-D) 5-120 MG tablet Take 1 tablet by mouth daily. Patient not taking: Reported on 06/10/2020 04/30/20   Wurst, [place], PA-C  docusate sodium (COLACE) 100 MG capsule Take 100 mg by mouth 2 (two) times daily.    [provider]  EPINEPHrine 0.3 mg/0.3 mL IJ SOAJ injection Inject 0.3 mLs (0.3 mg total) into the muscle as needed for anaphylaxis. Patient not taking: Reported on 06/10/2020 02/26/20   [name], MD  ferrous sulfate 325 (65 FE) MG EC tablet Take 325 mg by mouth daily with breakfast.    [provider]  fluticasone (FLONASE) 50 MCG/ACT nasal spray Place 2 sprays into both nostrils daily. 04/30/20   Wurst, [place], PA-C  hydrocortisone (CORTEF) 10 MG tablet Take 2 tablets by mouth daily at 8AM, 1 tablet by mouth daily at noon. 02/11/20   [name], MD  levothyroxine (SYNTHROID) 75 MCG tablet Take 1 tablet (75 mcg total) by mouth daily before breakfast. 02/11/20   Nida, Marella Chimes, MD  LINZESS 145 MCG CAPS capsule TAKE 1 CAPSULE BY MOUTH ONCE A DAY BEFORE BREAKFAST Patient taking differently: Take 145 mcg by mouth daily before breakfast.  02/03/20   [name], MD  methocarbamol (ROBAXIN) 500 MG tablet Take 1 tablet (500 mg total) by mouth 3 (three) times daily. 04/07/20   Triplett, Danaiya, PA-C  midodrine (PROAMATINE) 5 MG tablet TAKE 1 TABLET BY MOUTH TWICE DAILY WITH A MEAL Patient taking differently: Take 5 mg by mouth in the morning. TAKE 1 TABLET BY MOUTH TWICE DAILY WITH A MEAL 11/06/19   Luking, Elayne Snare, MD  ondansetron  (ZOFRAN) 4 MG tablet Take 1 tablet (4 mg total) by mouth daily as needed for nausea or vomiting. Patient not taking: Reported on 06/10/2020 04/12/20 04/12/21  [name], MD  ondansetron (ZOFRAN-ODT) 8 MG disintegrating tablet Take 1 tablet (8 mg total) by mouth every 8 (eight) hours as needed for nausea or vomiting. 04/12/20   [name], MD  Oxycodone HCl 10 MG TABS Take one tablet po q 4 hrs prn pain. Max 6 per day 05/11/20   [name], MD  Oxycodone HCl 10 MG TABS Take one tablet po q 4 hours prn pain. Max 6 per day 05/11/20   [name], MD  Oxycodone HCl 10 MG TABS Take one tablet po  q 4 hours prn pain. Max 6 per day 05/11/20   [name], MD  pantoprazole (PROTONIX) 40 MG tablet TAKE 1 TABLET BY MOUTH ONCE DAILY 03/21/20   [name], MD  phenazopyridine (PYRIDIUM) 200 MG tablet Take 1 tablet (200 mg total) by mouth 3 (three) times daily. 04/30/20   Wurst, [place], PA-C  predniSONE (DELTASONE) 5 MG tablet Take 10 mg by mouth daily. 04/04/20   [provider]  promethazine (PHENERGAN) 25 MG suppository Place 1 suppository (25 mg total) rectally every 6 (six) hours as needed for nausea or vomiting. 01/29/20   [name], MD  promethazine (PHENERGAN) 25 MG tablet TAKE 1 TABLET BY MOUTH EVERY 8 HOURS AS NEEDED FOR NAUSEA OR VOMITING Patient taking differently: Take 25 mg by mouth every 8 (eight) hours as needed for nausea or vomiting.  04/28/19   [name], MD  rosuvastatin (CRESTOR) 10 MG tablet Take 1 tablet (10 mg total) by mouth daily. 01/14/20   [name], MD    Allergies    Bee venom, Bactrim [sulfamethoxazole-trimethoprim], Codeine, Contrast media [iodinated diagnostic agents], and Morphine and related  Review of Systems   Review of Systems  All other systems reviewed and are negative. Ten systems reviewed and are negative for acute change, except as noted in the HPI.   Physical Exam Updated Vital Signs BP 123/61 (BP Location: Left Arm)    Pulse 96   Temp 98.1 F (36.7 C) (Oral)   Resp 18   Ht 5\' 10"  (1.778 m)   Wt 105 kg   LMP 05/17/2016   SpO2 96%   BMI 33.21 kg/m   Physical Exam Vitals and nursing note reviewed.  Constitutional:      General: She is not in acute distress.    Appearance: Normal appearance. She is obese. She is not ill-appearing, toxic-appearing or diaphoretic.     Comments: Well-developed adult female lying in the semi-Fowlers position.  She speaks clearly and coherently but speaks with a notably raspy voice.    HENT:     Head: Normocephalic and atraumatic.     Right Ear: External ear normal.     Left Ear: External ear normal.     Nose: Nose normal.     Mouth/Throat:     Mouth: Mucous membranes are moist.     Pharynx: Oropharynx is clear. No oropharyngeal exudate or posterior oropharyngeal erythema.  Eyes:     General: No scleral icterus.       Right eye: No discharge.        Left eye: No discharge.     Extraocular Movements: Extraocular movements intact.     Conjunctiva/sclera: Conjunctivae normal.     Comments: No significant erythema noted in the posterior oropharynx.  No exudates.  Status post tonsillectomy.  Uvula midline.  Readily handling secretions.  Cardiovascular:     Rate and Rhythm: Normal rate and regular rhythm.     Pulses: Normal pulses.     Heart sounds: Normal heart sounds. No murmur heard.  No friction rub. No gallop.      Comments: Regular rate and rhythm without murmurs, rubs, gallops. Pulmonary:     Effort: Pulmonary effort is normal. No respiratory distress.     Breath sounds: Normal breath sounds. No stridor. No wheezing, rhonchi or rales.     Comments: Lungs are clear to auscultation bilaterally.  Oxygen saturations between 98 to 100% on room air while conversing with me. Abdominal:  General: Abdomen is flat.     Palpations: Abdomen is soft.     Tenderness: There is no abdominal tenderness.  Musculoskeletal:        General: Normal range of motion.      Cervical back: Normal range of motion and neck supple. No tenderness.     Right lower leg: No edema.     Left lower leg: No edema.     Comments: No leg swelling or calf pain.  Skin:    General: Skin is warm and dry.  Neurological:     General: No focal deficit present.     Mental Status: She is alert and oriented to person, place, and time.  Psychiatric:        Mood and Affect: Mood normal.        Behavior: Behavior normal.    ED Results / Procedures / Treatments   Labs (all labs ordered are listed, but only abnormal results are displayed) Labs Reviewed  COMPREHENSIVE METABOLIC PANEL - Abnormal; Notable for the following components:      Result Value   AST 13 (*)    All other components within normal limits  CBC WITH DIFFERENTIAL/PLATELET - Abnormal; Notable for the following components:   Abs Immature Granulocytes 0.09 (*)    All other components within normal limits  RESPIRATORY PANEL BY RT PCR (FLU A&B, COVID)  I-STAT BETA HCG BLOOD, ED ([facility], [facility], [facility] ONLY)  TROPONIN I (HIGH SENSITIVITY)  TROPONIN I (HIGH SENSITIVITY)   EKG None  Radiology DG Chest Portable 1 View  Result Date: 06/28/2020 CLINICAL DATA:  Shortness of breath.  Cough and congestion. EXAM: PORTABLE CHEST 1 VIEW COMPARISON:  Radiograph 05/01/2020.  CT 10/21/2017 FINDINGS: Upper normal heart size. Chronic hilar retraction. Multifocal areas of scarring and interstitial coarsening, upper lobe predominant. No definite acute airspace disease. No pneumothorax or large pleural effusion. Stable osseous structures. IMPRESSION: Chronic lung findings with scarring. No definite superimposed acute finding. Electronically Signed   By: Keith Rake M.D.   On: 06/28/2020 19:56    Procedures Procedures (including critical care time)  Medications Ordered in ED Medications  guaiFENesin (ROBITUSSIN) 100 MG/5ML solution 300 mg (has no administration in time range)  acetaminophen (TYLENOL) tablet 650 mg (650 mg Oral Given  06/28/20 2119)   ED Course  I have reviewed the triage vital signs and the nursing notes.  Pertinent labs & imaging results that were available during my care of the patient were reviewed by me and considered in my medical decision making (see chart for details).  Clinical Course as of Jun 28 2209  Tue Jun 28, 2020  2004 FINDINGS: Upper normal heart size. Chronic hilar retraction. Multifocal areas of scarring and interstitial coarsening, upper lobe predominant. No definite acute airspace disease. No pneumothorax or large pleural effusion. Stable osseous structures.  DG Chest Portable 1 View [LJ]    Clinical Course User Index [LJ] [name], PA-C   MDM Rules/Calculators/A&P                          Pt is a 59 y.o. female that presents with a history, physical exam, and ED Clinical Course as noted above.   Patient presents today with a multitude of symptoms consistent with a viral URI.  Physical exam is generally reassuring.  Patient has a raspy voice but no significant erythema noted in the posterior oropharynx.  Status post tonsillectomy.  Readily handling secretions.  Lungs are clear  to auscultation bilaterally.  Chest x-ray obtained shows chronic scarring but no definite acute airspace disease.  Patient has baseline shortness of breath which she feels has gotten a bit worse.  Oxygen saturations in the high 90s on room air.  Patient given a dose of Robitussin as well as APAP for her headache.  Basic labs obtained today are all reassuring.  CBC without leukocytosis.  Normal electrolytes on CMP.  Troponin less than 2.  It is the end of my shift and patient care will be transferred to [place], PA-C.  Respiratory panel is pending at this time.  If patient is positive for COVID-19 feel that she would benefit from the MAB infusion before leaving the emergency department.  If negative, Tessalon Perles have been sent to the patient's pharmacy and feel that she will be safe for discharge at  this time.  Both of these plans were discussed with the patient and she is amenable.  She is currently sleeping comfortably in bed.  Note: Portions of this report may have been transcribed using voice recognition software. Every effort was made to ensure accuracy; however, inadvertent computerized transcription errors may be present.   Final Clinical Impression(s) / ED Diagnoses Final diagnoses:  Viral URI with cough   Rx / DC Orders ED Discharge Orders         Ordered    benzonatate (TESSALON) 100 MG capsule  3 times daily PRN        06/28/20 2159           [name], PA-C 06/28/20 2210    Quintella Reichert, MD 06/29/20 0006

## 2020-06-28 NOTE — ED Provider Notes (Signed)
59 year old female received a signout from [facility] pending respiratory virus panel.  Per her HPI:  "Kathleen [name] is a 59 y.o. female.  HPI Patient is a 59 year old female with history of adrenal insufficiency, sarcoidosis, chronic shortness of breath, MVP, chronic pain, who presents to the emergency department with cough.  Patient states her symptoms started yesterday.  Her cough is dry.  She reports associated congestion, sore throat, voice change, chest tightness, worsening shortness of breath, moderate diffuse HA.  Denies any known sick contacts.  States that she was vaccinated for COVID-19 in April of this year with the Moderna vaccine.  No nausea, vomiting, abdominal pain, urinary changes, syncope."   Physical Exam  BP 123/61 (BP Location: Left Arm)   Pulse 96   Temp 98.1 F (36.7 C) (Oral)   Resp 18   Ht 5\' 10"  (1.778 m)   Wt 105 kg   LMP 05/17/2016   SpO2 96%   BMI 33.21 kg/m   Physical Exam Vitals and nursing note reviewed.  Constitutional:      Appearance: She is well-developed.     Comments: Nontoxic-appearing.  HENT:     Head: Normocephalic and atraumatic.  Pulmonary:     Comments: Able to speak in complete, fluent sentences without increased work of breathing. Musculoskeletal:        General: Normal range of motion.     Cervical back: Normal range of motion.  Neurological:     Mental Status: She is alert and oriented to person, place, and time.     ED Course/Procedures   Clinical Course as of Jun 28 2202  Tue Jun 28, 2020  2004 FINDINGS: Upper normal heart size. Chronic hilar retraction. Multifocal areas of scarring and interstitial coarsening, upper lobe predominant. No definite acute airspace disease. No pneumothorax or large pleural effusion. Stable osseous structures.  DG Chest Portable 1 View [LJ]    Clinical Course User Index [LJ] [name], PA-C    Procedures  MDM  59 year old female received a signout from [facility] pending  viral panel.  Please see his note for further work-up and medical decision making.  In brief, this is a 59 year old with multiple chronic medical conditions presenting with viral URI symptoms.  She has been fully vaccinated against COVID-19.  Unfortunately, her COVID-19 test was positive today.  I discussed these findings with the patient bedside.  We discussed the risks and benefit of monoclonal antibody infusion, which she was agreeable to in the ER today.  The infusion was performed and she was observed for an appropriate amount of time without developing any associated symptoms.  Was sent home with symptomatic management.  COVID-19 safety precautions discussed with the patient at bedside.  ER return precautions given.  She is hemodynamically stable and in no acute distress.  Safe for discharge to home with outpatient follow-up as needed.     [name], PA-C 06/29/20 0137    [name], MD 06/29/20 [phone]

## 2020-06-28 NOTE — ED Triage Notes (Signed)
Pt reports cough, congestion, shob. And pain on inspiration since lunch time yesterday. Denies exposure to anyone with covid that she knows of.

## 2020-06-29 MED FILL — BENZONATATE 100 MG CAPS: 100 | 7 days supply | Qty: 21 | Fill #0

## 2020-07-04 ENCOUNTER — Telehealth: Payer: Self-pay | Admitting: Family Medicine

## 2020-07-04 ENCOUNTER — Other Ambulatory Visit: Payer: Self-pay

## 2020-07-04 ENCOUNTER — Other Ambulatory Visit: Payer: Self-pay | Admitting: Family Medicine

## 2020-07-04 ENCOUNTER — Telehealth (INDEPENDENT_AMBULATORY_CARE_PROVIDER_SITE_OTHER): Payer: 59 | Admitting: Family Medicine

## 2020-07-04 DIAGNOSIS — U071 COVID-19: Secondary | ICD-10-CM | POA: Diagnosis not present

## 2020-07-04 DIAGNOSIS — R0902 Hypoxemia: Secondary | ICD-10-CM

## 2020-07-04 MED ORDER — FLUOXETINE HCL 10 MG PO CAPS: 10.0000 mg | ORAL_CAPSULE | Freq: Every day | ORAL | 1 refills | Status: DC

## 2020-07-04 MED ORDER — HYDROXYZINE HCL 25 MG PO TABS: ORAL_TABLET | ORAL | 0 refills | Status: DC

## 2020-07-04 NOTE — Telephone Encounter (Signed)
Ms. Kathleen [name], Kathleen [name] are scheduled for a virtual visit with your provider today.    Just as we do with appointments in the office, we must obtain your consent to participate.  Your consent will be active for this visit and any virtual visit you may have with one of our providers in the next 365 days.    If you have a MyChart account, I can also send a copy of this consent to you electronically.  All virtual visits are billed to your insurance company just like a traditional visit in the office.  As this is a virtual visit, video technology does not allow for your provider to perform a traditional examination.  This may limit your provider's ability to fully assess your condition.  If your provider identifies any concerns that need to be evaluated in person or the need to arrange testing such as labs, EKG, etc, we will make arrangements to do so.    Although advances in technology are sophisticated, we cannot ensure that it will always work on either your end or our end.  If the connection with a video visit is poor, we may have to switch to a telephone visit.  With either a video or telephone visit, we are not always able to ensure that we have a secure connection.   I need to obtain your verbal consent now.   Are you willing to proceed with your visit today?   Kathleen [name] has provided verbal consent on 07/04/2020 for a virtual visit (video or telephone).   Vicente Males, LPN [id number]  9:38 PM

## 2020-07-04 NOTE — Progress Notes (Signed)
   Subjective:    Patient ID: Kathleen [name], female    DOB: 09-27-1960, 59 y.o.   MRN: [id number]   HPI Pt went to [facility] on 06/28/20 and was diagnosed with COVID. Pt having cough, congestion and sore throat. Pt having to use oxygen 24 hours a day. If she takes oxygen off, her stats drop to 84%. Pt had monoclonial antibody infusion at [facility]. Patient with some shortness of breath with activity denies high fever chills relates recent Covid test positive has underlying lung disease was had a phone conversation with her lung doctor earlier today Virtual Visit via Telephone Note  I connected with [name] on 07/04/20 at  3:00 PM EST by telephone and verified that I am speaking with the correct person using two identifiers.  Location: Patient: home Provider: office   I discussed the limitations, risks, security and privacy concerns of performing an evaluation and management service by telephone and the availability of in person appointments. I also discussed with the patient that there may be a patient responsible charge related to this service. The patient expressed understanding and agreed to proceed.   History of Present Illness:    Observations/Objective:   Assessment and Plan:   Follow Up Instructions:    I discussed the assessment and treatment plan with the patient. The patient was provided an opportunity to ask questions and all were answered. The patient agreed with the plan and demonstrated an understanding of the instructions.   The patient was advised to call back or seek an in-person evaluation if the symptoms worsen or if the condition fails to improve as anticipated.  I provided 25 including chart review documentation and time spent with patient minutes of non-face-to-face time during this encounter.       Review of Systems  Constitutional: Negative for activity change and fever.  HENT: Positive for congestion and rhinorrhea. Negative for ear pain.    Eyes: Negative for discharge.  Respiratory: Positive for cough. Negative for shortness of breath and wheezing.   Cardiovascular: Negative for chest pain.       Objective:   Physical Exam   Today's visit was via telephone Physical exam was not possible for this visit  Patient has already had infusion    Assessment & Plan:  Covid infection Underlying lung disease Hypoxia Continue using O2 Give [place] update in a few days how she is doing If worse may need x-rays and/or lab work

## 2020-07-05 ENCOUNTER — Telehealth: Payer: Self-pay

## 2020-07-05 NOTE — Telephone Encounter (Signed)
Fluoxetine is 1 a day every day to help with anxiety and any depression symptoms  Hydroxyzine is something that can be used once every 6 hours as needed for anxiousness This medication helps to some degree with anxiousness but in some people can cause drowsiness It is worth trying but if it causes significant issues to let us know

## 2020-07-05 NOTE — Telephone Encounter (Signed)
Patient wants to know if she can take both medication today and at separate times. You prescribed fluoxetine 10 mg and hydroxyzine 25 mg she wants to know how to take them. Please advise

## 2020-07-05 NOTE — Telephone Encounter (Signed)
Patient notified and verbalized understanding. 

## 2020-07-13 ENCOUNTER — Other Ambulatory Visit: Payer: Self-pay | Admitting: Family Medicine

## 2020-07-13 ENCOUNTER — Other Ambulatory Visit: Payer: Self-pay | Admitting: "Endocrinology

## 2020-07-13 DIAGNOSIS — E2740 Unspecified adrenocortical insufficiency: Secondary | ICD-10-CM

## 2020-07-13 MED FILL — LINZESS 145 MCG CAPSULE: 145 | 90 days supply | Qty: 90 | Fill #0

## 2020-07-19 DIAGNOSIS — D860 Sarcoidosis of lung: Secondary | ICD-10-CM | POA: Diagnosis not present

## 2020-07-19 DIAGNOSIS — J9601 Acute respiratory failure with hypoxia: Secondary | ICD-10-CM | POA: Diagnosis not present

## 2020-07-19 DIAGNOSIS — M6281 Muscle weakness (generalized): Secondary | ICD-10-CM | POA: Diagnosis not present

## 2020-07-19 DIAGNOSIS — J849 Interstitial pulmonary disease, unspecified: Secondary | ICD-10-CM | POA: Diagnosis not present

## 2020-07-26 ENCOUNTER — Other Ambulatory Visit: Payer: Self-pay | Admitting: Family Medicine

## 2020-07-26 MED FILL — PANTOPRAZOLE SOD DR 40 MG T: 40 | 90 days supply | Qty: 90 | Fill #1

## 2020-07-26 MED FILL — MIDODRINE HCL 5 MG TABS: 5 | 30 days supply | Qty: 60 | Fill #0

## 2020-07-26 NOTE — Telephone Encounter (Signed)
02/26/20 was last check up

## 2020-07-27 MED FILL — oxyCODONE HCL 10 MG TABS: 10 | 30 days supply | Qty: 180 | Fill #0

## 2020-07-28 ENCOUNTER — Other Ambulatory Visit: Payer: Self-pay | Admitting: Family Medicine

## 2020-07-28 ENCOUNTER — Telehealth: Payer: Self-pay | Admitting: Family Medicine

## 2020-07-28 DIAGNOSIS — F411 Generalized anxiety disorder: Secondary | ICD-10-CM

## 2020-07-28 MED ORDER — HYDROXYZINE HCL 25 MG PO TABS: ORAL_TABLET | ORAL | 0 refills | Status: DC

## 2020-07-28 MED FILL — HYDROXYZINE HCL 25 MG TABS: 25 | 15 days supply | Qty: 30 | Fill #0

## 2020-07-28 MED FILL — FLUoxetine HCL 10 MG CAPS: 10 | 30 days supply | Qty: 30 | Fill #0

## 2020-07-28 NOTE — Telephone Encounter (Signed)
Kathleen [name]

## 2020-07-28 NOTE — Telephone Encounter (Signed)
Pharmacy requesting refill on Atarax 25 mg. Pt last seen 07/04/20. Please advise. Thank you  [place]

## 2020-08-01 MED FILL — HYDROCORTISONE 10 MG TABLET: 10 | 90 days supply | Qty: 270 | Fill #0

## 2020-08-02 ENCOUNTER — Telehealth: Payer: Self-pay | Admitting: *Deleted

## 2020-08-02 ENCOUNTER — Other Ambulatory Visit: Payer: Self-pay | Admitting: Family Medicine

## 2020-08-02 NOTE — Telephone Encounter (Signed)
Voicemail left at 1:11pm today  Dr. Alonza Bogus - medical director with [employer] employee plan. Would like a call back from Dr. Wolfgang Phoenix about a potential inappropriate medication.   Phone number [phone]

## 2020-08-03 ENCOUNTER — Telehealth: Payer: Self-pay
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
## 2020-08-03 NOTE — Telephone Encounter (Signed)
Nurses I spoke with the medical director (They were concerned that her miderine medicine could potentially cause urinary retention)  Nurses please call Kathleen [name] she has an appointment with me at the end of the month please encourage her to bring all of her medications with her so we can review them to make sure that all of her medicines get along thank you

## 2020-08-04 NOTE — Telephone Encounter (Signed)
Will call pt closer to 12/30 appt so that she does not forget to bring meds.

## 2020-08-07 NOTE — Telephone Encounter (Signed)
This form was filled out 

## 2020-08-08 DIAGNOSIS — Z029 Encounter for administrative examinations, unspecified: Secondary | ICD-10-CM

## 2020-08-16 ENCOUNTER — Other Ambulatory Visit: Payer: Self-pay | Admitting: Family Medicine

## 2020-08-16 DIAGNOSIS — F411 Generalized anxiety disorder: Secondary | ICD-10-CM

## 2020-08-16 MED FILL — ONDANSETRON ODT 8 MG TABLET: 8 | 10 days supply | Qty: 30 | Fill #1

## 2020-08-16 MED FILL — HYDROXYZINE HCL 25 MG TABS: 25 | 15 days supply | Qty: 30 | Fill #0

## 2020-08-16 NOTE — Telephone Encounter (Signed)
May have 5 refills on each 

## 2020-08-18 DIAGNOSIS — M6281 Muscle weakness (generalized): Secondary | ICD-10-CM | POA: Diagnosis not present

## 2020-08-18 DIAGNOSIS — D860 Sarcoidosis of lung: Secondary | ICD-10-CM | POA: Diagnosis not present

## 2020-08-18 DIAGNOSIS — J849 Interstitial pulmonary disease, unspecified: Secondary | ICD-10-CM | POA: Diagnosis not present

## 2020-08-18 DIAGNOSIS — J9601 Acute respiratory failure with hypoxia: Secondary | ICD-10-CM | POA: Diagnosis not present

## 2020-08-19 MED FILL — MIDODRINE HCL 5 MG TABS: 5 | 30 days supply | Qty: 60 | Fill #1

## 2020-08-21 MED FILL — FLUoxetine HCL 10 MG CAPS: 10 | 30 days supply | Qty: 30 | Fill #0

## 2020-08-24 NOTE — Telephone Encounter (Signed)
Pt returned call and verbalized understanding  

## 2020-08-24 NOTE — Telephone Encounter (Signed)
Left message to return call 

## 2020-08-25 ENCOUNTER — Ambulatory Visit (INDEPENDENT_AMBULATORY_CARE_PROVIDER_SITE_OTHER): Payer: 59 | Admitting: Family Medicine

## 2020-08-25 ENCOUNTER — Other Ambulatory Visit: Payer: Self-pay

## 2020-08-25 ENCOUNTER — Other Ambulatory Visit: Payer: Self-pay | Admitting: *Deleted

## 2020-08-25 DIAGNOSIS — F411 Generalized anxiety disorder: Secondary | ICD-10-CM | POA: Diagnosis not present

## 2020-08-25 DIAGNOSIS — R059 Cough, unspecified: Secondary | ICD-10-CM

## 2020-08-25 MED ORDER — AMOXICILLIN 500 MG PO CAPS: 500.0000 mg | ORAL_CAPSULE | Freq: Three times a day (TID) | ORAL | 0 refills | Status: DC

## 2020-08-25 MED ORDER — HYDROXYZINE HCL 25 MG PO TABS
ORAL_TABLET | ORAL | 2 refills | Status: DC
Start: 2020-08-25 — End: 2020-08-25

## 2020-08-25 MED ORDER — OXYCODONE HCL 10 MG PO TABS: ORAL_TABLET | ORAL | 0 refills | Status: DC

## 2020-08-25 MED ORDER — FLUOXETINE HCL 20 MG PO TABS
20.0000 mg | ORAL_TABLET | Freq: Every day | ORAL | 0 refills | Status: DC
Start: 2020-08-25 — End: 2020-10-14

## 2020-08-25 MED ORDER — FLUOXETINE HCL 20 MG PO TABS: 20.0000 mg | ORAL_TABLET | Freq: Every day | ORAL | 3 refills | Status: DC

## 2020-08-25 MED ORDER — HYDROXYZINE HCL 25 MG PO TABS
ORAL_TABLET | ORAL | 2 refills | Status: DC
Start: 2020-08-25 — End: 2020-11-08

## 2020-08-25 MED FILL — HYDROXYZINE HCL 25 MG TABS: 25 | 20 days supply | Qty: 60 | Fill #0

## 2020-08-25 NOTE — Progress Notes (Signed)
   Subjective:    Patient ID: Kathleen [name], female    DOB: 08-Sep-1960, 59 y.o.   MRN: [id number]  Cough This is a new problem. The current episode started today. Associated symptoms include nasal congestion, rhinorrhea and a sore throat. Pertinent negatives include no chest pain, ear pain, fever, shortness of breath or wheezing.   Patient had Covid in November per patient  Review of Systems  Constitutional: Negative for activity change and fever.  HENT: Positive for congestion, rhinorrhea and sore throat. Negative for ear pain.   Eyes: Negative for discharge.  Respiratory: Positive for cough. Negative for shortness of breath and wheezing.   Cardiovascular: Negative for chest pain.       Objective:   Physical Exam Vitals and nursing note reviewed.  Constitutional:      Appearance: She is well-developed.  HENT:     Head: Normocephalic.     Nose: Nose normal.     Mouth/Throat:     Mouth: Oropharynx is clear and moist.     Pharynx: No oropharyngeal exudate.  Cardiovascular:     Rate and Rhythm: Normal rate.     Heart sounds: Normal heart sounds. No murmur heard.   Pulmonary:     Effort: Pulmonary effort is normal.     Breath sounds: Normal breath sounds. No wheezing.  Musculoskeletal:     Cervical back: Neck supple.  Lymphadenopathy:     Cervical: No cervical adenopathy.  Skin:    General: Skin is warm and dry.           Assessment & Plan:  Unlikely to be Covid since patient just had Covid More than likely a mild virus May increase her prednisone should she start having lung related issues with it Amoxicillin prescribed just in case she develops into a sinus infection Hold off on any additional medications currently

## 2020-08-26 MED FILL — oxyCODONE HCL 10 MG TABS: 10 | 30 days supply | Qty: 180 | Fill #0

## 2020-08-26 MED FILL — LEVOTHYROXINE 75 MCG TABLET: 75 | 90 days supply | Qty: 84 | Fill #1

## 2020-08-26 MED FILL — predniSONE 5 MG TABS: 5 | 90 days supply | Qty: 180 | Fill #1

## 2020-08-27 LAB — NOVEL CORONAVIRUS, NAA: SARS-CoV-2, NAA: NOT DETECTED

## 2020-08-27 LAB — SARS-COV-2, NAA 2 DAY TAT

## 2020-08-30 ENCOUNTER — Other Ambulatory Visit: Payer: Self-pay | Admitting: *Deleted

## 2020-08-30 ENCOUNTER — Telehealth: Payer: Self-pay

## 2020-08-30 MED ORDER — LEVOFLOXACIN 500 MG PO TABS: 500.0000 mg | ORAL_TABLET | Freq: Every day | ORAL | 0 refills | Status: DC

## 2020-08-30 NOTE — Telephone Encounter (Signed)
Nurses Please communicate with patient If not seeing significant improvement over the next several days she will need to be seen Levaquin 500 mg 1 daily for 7 days

## 2020-08-30 NOTE — Telephone Encounter (Signed)
Seen 12/30 and prescribed amoxil. States she is feeling worse. Having cough and congestion. States no more than normal sob. No fever. Cough is non productive.

## 2020-08-30 NOTE — Telephone Encounter (Signed)
Patient had appt on December 30 th and prescribed amoxicillin. Cough, congestion still really bad.   She still feels the same, if not worse.  She started taking the amoxicillin and that has not helped at all.  She is wanting a stronger antibiotic called in to [facility] on [address]. Patient said to call her house # at [phone]

## 2020-08-30 NOTE — Telephone Encounter (Signed)
Rx sent and patient notified.

## 2020-09-13 ENCOUNTER — Ambulatory Visit (INDEPENDENT_AMBULATORY_CARE_PROVIDER_SITE_OTHER): Payer: 59 | Admitting: Family Medicine

## 2020-09-13 ENCOUNTER — Encounter: Payer: Self-pay | Admitting: Family Medicine

## 2020-09-13 ENCOUNTER — Other Ambulatory Visit: Payer: Self-pay

## 2020-09-13 VITALS — BP 120/86 | HR 88 | Temp 96.8°F | Wt 236.4 lb

## 2020-09-13 DIAGNOSIS — M79641 Pain in right hand: Secondary | ICD-10-CM | POA: Diagnosis not present

## 2020-09-13 DIAGNOSIS — T148XXA Other injury of unspecified body region, initial encounter: Secondary | ICD-10-CM

## 2020-09-13 NOTE — Progress Notes (Signed)
   Subjective:    Patient ID: Kathleen [name], female    DOB: 1961-03-18, 61 y.o.   MRN: [id number]  Patient reports waking up yesterday morning with swelling and bruising on her right pointer finger. Does not recall doing anything to cause it, not having any pain but numbness at times. Concerned about blood poisoning as this has happened to her mother in the past. Finished an abx several days ago.  Patient relates bruising on her index finger she does not recall hitting it.  She denies any stiffness with it.  Denies fever chills sweats.   Review of Systems  Constitutional: Negative for activity change and appetite change.  HENT: Negative for congestion and rhinorrhea.   Respiratory: Negative for cough and shortness of breath.   Cardiovascular: Negative for chest pain and leg swelling.  Gastrointestinal: Negative for abdominal pain, nausea and vomiting.  Skin: Negative for color change.  Neurological: Negative for dizziness and weakness.  Psychiatric/Behavioral: Negative for agitation and confusion.       Objective:   Physical Exam Lungs clear respiratory rate normal heart regular pulse normal skin warm dry finger has bruising on the index finger but no significant swelling O2 saturation good the palmar or sole surface is a normal appearance       Assessment & Plan:  Finger bruising I do not recommend any lab work currently I believe that this is mainly due to the prednisone she is on causing fragile blood vessels there is no sign of bruising anywhere else if she starts having any increased issues of bruising then obviously will need to have further work-up

## 2020-09-18 DIAGNOSIS — J9601 Acute respiratory failure with hypoxia: Secondary | ICD-10-CM | POA: Diagnosis not present

## 2020-09-18 DIAGNOSIS — D860 Sarcoidosis of lung: Secondary | ICD-10-CM | POA: Diagnosis not present

## 2020-09-18 DIAGNOSIS — M6281 Muscle weakness (generalized): Secondary | ICD-10-CM | POA: Diagnosis not present

## 2020-09-18 DIAGNOSIS — J849 Interstitial pulmonary disease, unspecified: Secondary | ICD-10-CM | POA: Diagnosis not present

## 2020-09-22 ENCOUNTER — Other Ambulatory Visit: Payer: Self-pay | Admitting: Family Medicine

## 2020-09-22 MED FILL — FLUOXETINE HCL 20 MG TABS: 20 | 30 days supply | Qty: 30 | Fill #0

## 2020-09-22 MED FILL — HYDROXYZINE HCL 25 MG TABS: 25 | 20 days supply | Qty: 60 | Fill #1

## 2020-09-22 MED FILL — MIDODRINE HCL 5 MG TABS: 5 | 30 days supply | Qty: 60 | Fill #2

## 2020-09-23 ENCOUNTER — Other Ambulatory Visit: Payer: Self-pay | Admitting: Family Medicine

## 2020-09-28 ENCOUNTER — Emergency Department (HOSPITAL_BASED_OUTPATIENT_CLINIC_OR_DEPARTMENT_OTHER)
Admission: EM | Admit: 2020-09-28 | Discharge: 2020-09-29 | Disposition: A | Discharge status: Home / Self Care | Payer: 59 | Attending: Emergency Medicine | Admitting: Emergency Medicine

## 2020-09-28 ENCOUNTER — Other Ambulatory Visit: Payer: Self-pay

## 2020-09-28 ENCOUNTER — Encounter (HOSPITAL_BASED_OUTPATIENT_CLINIC_OR_DEPARTMENT_OTHER): Payer: Self-pay | Admitting: *Deleted

## 2020-09-28 DIAGNOSIS — L02811 Cutaneous abscess of head [any part, except face]: Secondary | ICD-10-CM | POA: Diagnosis not present

## 2020-09-28 MED ORDER — DOXYCYCLINE HYCLATE 100 MG PO TABS
100.0000 mg | ORAL_TABLET | Freq: Once | ORAL | Status: AC
  Administered 2020-09-29: 100 mg via ORAL
  Filled 2020-09-28: qty 1

## 2020-09-28 MED ORDER — LIDOCAINE-EPINEPHRINE (PF) 2 %-1:200000 IJ SOLN
INTRAMUSCULAR | Status: AC
  Administered 2020-09-28: 20 mL
  Filled 2020-09-28: qty 20

## 2020-09-28 MED ORDER — LIDOCAINE-EPINEPHRINE 1 %-1:100000 IJ SOLN
INTRAMUSCULAR | Status: AC
  Filled 2020-09-28: qty 1

## 2020-09-28 MED ORDER — LIDOCAINE-EPINEPHRINE 2 %-1:100000 IJ SOLN: 20.0000 mL | Freq: Once | INTRAMUSCULAR | Status: DC

## 2020-09-28 MED ORDER — HYDROMORPHONE HCL 1 MG/ML IJ SOLN
2.0000 mg | Freq: Once | INTRAMUSCULAR | Status: AC
Start: 2020-09-28 — End: 2020-09-28
  Administered 2020-09-28: 2 mg via INTRAMUSCULAR
  Filled 2020-09-28: qty 2

## 2020-09-28 MED ORDER — DOXYCYCLINE HYCLATE 100 MG PO CAPS: 100.0000 mg | ORAL_CAPSULE | Freq: Two times a day (BID) | ORAL | 0 refills | Status: DC

## 2020-09-28 MED ORDER — ONDANSETRON 4 MG PO TBDP
8.0000 mg | ORAL_TABLET | Freq: Once | ORAL | Status: AC
  Administered 2020-09-28: 8 mg via ORAL
  Filled 2020-09-28: qty 2

## 2020-09-28 MED FILL — oxyCODONE HCL 10 MG TABS: 10 | 30 days supply | Qty: 180 | Fill #0

## 2020-09-28 NOTE — ED Triage Notes (Signed)
C/o abscess to top of head x 2 days

## 2020-09-28 NOTE — ED Notes (Signed)
Pt O2 stats is 97. Pt is requesting to be placed on oxygen. Pt states she is on 2lpm O2 at home at night. Pt placed on 2lpm O2 by N/C per pt request. RN Delrae Alfred informed

## 2020-09-28 NOTE — ED Provider Notes (Signed)
[facility] Provider Note: Georgena Spurling, MD, FACEP  CSN: [id number] MRN: [id number] ARRIVAL: 09/28/20 at 2108 ROOM: [place]  Abscess   HISTORY OF PRESENT ILLNESS  09/28/20 11:22 PM Kathleen [name] is a 60 y.o. female who is on pain management receiving 180, 10 mg OxyIR tablets monthly.  She is here with a tender, swollen area of her right frontal scalp that began yesterday.  She rates the pain is a 10 out of 10, worse with palpation.  She is not aware of any injury or other inciting event.  She has not had a fever with this.  She has not gotten relief with her home narcotic medications.   Past Medical History:  Diagnosis Date  . Adrenal insufficiency ([place])   . Anemia   . Avascular bone necrosis ([place])   . Breast fibrocystic disorder   . Chronic pain   . Encounter for long-term opiate analgesic use 02/02/2020   Patient has chronic pain with femoral head avascular necrosis.  Is under pain contract  . Fibromyalgia   . Gastroesophageal reflux disease   . Mitral valve prolapse   . Orthostatic hypotension   . Peripheral neuropathy   . Restrictive lung disease    Moderate to severe  . Sarcoidosis    Biopsy proven - [facility]  . SOB (shortness of breath)    chronic    Past Surgical History:  Procedure Laterality Date  . BIOPSY  10/17/2018   Procedure: BIOPSY;  Surgeon: Rogene Houston, MD;  Location: [facility];  Service: Endoscopy;;  duodenum, antrum, gastric body  . BREAST LUMPECTOMY  01/12/2011   right  . ESOPHAGOGASTRODUODENOSCOPY (EGD) WITH PROPOFOL N/A 10/17/2018   Procedure: ESOPHAGOGASTRODUODENOSCOPY (EGD) WITH PROPOFOL;  Surgeon: Rogene Houston, MD;  Location: [facility];  Service: Endoscopy;  Laterality: N/A;  2:25  . TUBAL LIGATION      Family History  Problem Relation Age of Onset  . Cardiomyopathy Mother   . Breast cancer Mother   . Prostate cancer Father   . Neurofibromatosis Brother   . Heart disease Maternal Aunt   .  Bipolar disorder Daughter   . Colon cancer Neg Hx     Social History   Tobacco Use  . Smoking status: Never Smoker  . Smokeless tobacco: Never Used  Vaping Use  . Vaping Use: Never used  Substance Use Topics  . Alcohol use: No  . Drug use: No    Prior to Admission medications   Medication Sig Start Date End Date Taking? Authorizing Provider  doxycycline (VIBRAMYCIN) 100 MG capsule Take 1 capsule (100 mg total) by mouth 2 (two) times daily. One po bid x 7 days 09/28/20  Yes Cherryl Babin, MD  acetaminophen (TYLENOL) 500 MG tablet Take 1,000 mg by mouth every 8 (eight) hours as needed for headache.    [provider]  cetirizine-pseudoephedrine (ZYRTEC-D) 5-120 MG tablet Take 1 tablet by mouth daily. 04/30/20   Wurst, [place], PA-C  docusate sodium (COLACE) 100 MG capsule Take 100 mg by mouth 2 (two) times daily.    [provider]  EPINEPHrine 0.3 mg/0.3 mL IJ SOAJ injection Inject 0.3 mLs (0.3 mg total) into the muscle as needed for anaphylaxis. 02/26/20   [name], MD  ferrous sulfate 325 (65 FE) MG EC tablet Take 325 mg by mouth daily with breakfast.    [provider]  FLUoxetine (PROZAC) 20 MG tablet Take 1 tablet (20 mg total)  by mouth daily. 08/25/20   [name], MD  fluticasone (FLONASE) 50 MCG/ACT nasal spray Place 2 sprays into both nostrils daily. 04/30/20   Wurst, [place], PA-C  hydrocortisone (CORTEF) 10 MG tablet TAKE 2 TABLETS BY MOUTH DAILY AT 8 AM AND 1 TABLET DAILY AT NOON 07/13/20   [name], MD  hydrOXYzine (ATARAX/VISTARIL) 25 MG tablet TAKE 1 TABLET BY MOUTH 3 TIMES DAILY AS NEEDED FOR ANXIETY 08/25/20   [name], MD  levofloxacin (LEVAQUIN) 500 MG tablet Take 1 tablet (500 mg total) by mouth daily. 08/30/20   [name], MD  levothyroxine (SYNTHROID) 75 MCG tablet Take 1 tablet (75 mcg total) by mouth daily before breakfast. 02/11/20   Nida, Marella Chimes, MD  LINZESS 145 MCG CAPS capsule TAKE 1 CAPSULE BY  MOUTH ONCE A DAY BEFORE BREAKFAST 07/13/20   [name], MD  methocarbamol (ROBAXIN) 500 MG tablet Take 1 tablet (500 mg total) by mouth 3 (three) times daily. 04/07/20   Triplett, Dorraine, PA-C  midodrine (PROAMATINE) 5 MG tablet TAKE 1 TABLET BY MOUTH TWICE DAILY WITH A MEAL 07/26/20   Luking, Elayne Snare, MD  ondansetron (ZOFRAN-ODT) 8 MG disintegrating tablet Take 1 tablet (8 mg total) by mouth every 8 (eight) hours as needed for nausea or vomiting. 04/12/20   [name], MD  Oxycodone HCl 10 MG TABS Take one tablet po q 4 hrs prn pain. Max 6 per day 08/25/20   [name], MD  Oxycodone HCl 10 MG TABS Take one tablet po q 4 hours prn pain. Max 6 per day 08/25/20   [name], MD  Oxycodone HCl 10 MG TABS Take one tablet po q 4 hours prn pain. Max 6 per day 08/25/20   [name], MD  pantoprazole (PROTONIX) 40 MG tablet TAKE 1 TABLET BY MOUTH ONCE DAILY 09/23/20   [name], MD    Allergies Bee venom, Bactrim [sulfamethoxazole-trimethoprim], Codeine, Contrast media [iodinated diagnostic agents], and Morphine and related   REVIEW OF SYSTEMS  Negative except as noted here or in the History of Present Illness.   PHYSICAL EXAMINATION  Initial Vital Signs Blood pressure (!) 143/64, pulse 90, temperature 97.6 F (36.4 C), resp. rate 20, height 5\' 10"  (1.778 m), weight 90.7 kg, last menstrual period 05/17/2016, SpO2 98 %.  Examination General: Well-developed, well-nourished female in no acute distress; appearance consistent with age of record HENT: normocephalic; atraumatic; tender, swollen, pointing, erythematous mass right frontal scalp consistent with abscess EEyes: pupils equal, round and reactive to light; extraocular muscles intact Neck: supple Heart: regular rate and rhythm Lungs: clear to auscultation bilaterally Abdomen: soft; nondistended; nontender; bowel sounds present Extremities: No deformity; full range of motion Neurologic: Awake, alert and oriented;  motor function intact in all extremities and symmetric; no facial droop Skin: Warm and dry Psychiatric: Grimacing; writhing   RESULTS  Summary of this visit's results, reviewed and interpreted by myself:   EKG Interpretation  Date/Time:    Ventricular Rate:    PR Interval:    QRS Duration:   QT Interval:    QTC Calculation:   R Axis:     Text Interpretation:        Laboratory Studies: No results found for this or any previous visit (from the past 24 hour(s)). Imaging Studies: No results found.  ED COURSE and MDM  Nursing notes, initial and subsequent vitals signs, including pulse oximetry, reviewed and interpreted by myself.  Vitals:  09/28/20 2116 09/28/20 2333  BP: (!) 143/64 (!) 142/75  Pulse: 90 83  Resp: 20 16  Temp: 97.6 F (36.4 C)   SpO2: 98% 97%  Weight: 90.7 kg   Height: 5\' 10"  (1.778 m)    Medications  lidocaine-EPINEPHrine (XYLOCAINE W/EPI) 2 %-1:100000 (with pres) injection 20 mL (has no administration in time range)  doxycycline (VIBRA-TABS) tablet 100 mg (has no administration in time range)  ondansetron (ZOFRAN-ODT) disintegrating tablet 8 mg (8 mg Oral Given 09/28/20 2337)  HYDROmorphone (DILAUDID) injection 2 mg (2 mg Intramuscular Given 09/28/20 2339)  lidocaine-EPINEPHrine (XYLOCAINE W/EPI) 2 %-1:200000 (PF) injection (20 mLs  Given by Other 09/28/20 2338)    Patient has another nodule of her scalp that is not erythematous or tender but is concerning for another early abscess.  We will go ahead and start her on doxycycline.  PROCEDURES  Procedures INCISION AND DRAINAGE Performed by: Karen Chafe Marciel Offenberger Consent: Verbal consent obtained. Risks and benefits: risks, benefits and alternatives were discussed Type: abscess  Body area: Right frontal scalp  Anesthesia: local infiltration  Incision was made with a scalpel.  Local anesthetic: lidocaine 2% with epinephrine  Anesthetic total: 3 ml  Complexity: complex Blunt dissection to break up  loculations  Drainage: purulent  Drainage amount: Moderate  Packing material: None  Patient tolerance: Patient tolerated the procedure well with no immediate complications.   ED DIAGNOSES     ICD-10-CM   1. Abscess of scalp  L02.811        Gurleen Larrivee, Jenny Reichmann, MD 09/28/20 2350

## 2020-09-29 ENCOUNTER — Encounter: Payer: Self-pay | Admitting: Family Medicine

## 2020-09-29 ENCOUNTER — Ambulatory Visit (INDEPENDENT_AMBULATORY_CARE_PROVIDER_SITE_OTHER): Payer: 59 | Admitting: Family Medicine

## 2020-09-29 VITALS — BP 122/60 | HR 110 | Temp 97.2°F | Ht 70.0 in | Wt 237.0 lb

## 2020-09-29 DIAGNOSIS — R300 Dysuria: Secondary | ICD-10-CM | POA: Diagnosis not present

## 2020-09-29 DIAGNOSIS — L02811 Cutaneous abscess of head [any part, except face]: Secondary | ICD-10-CM | POA: Diagnosis not present

## 2020-09-29 LAB — POCT URINALYSIS DIPSTICK
Spec Grav, UA: 1.02 (ref 1.010–1.025)
pH, UA: 5 (ref 5.0–8.0)

## 2020-09-29 MED ORDER — DOXYCYCLINE HYCLATE 100 MG PO CAPS: 100.0000 mg | ORAL_CAPSULE | Freq: Two times a day (BID) | ORAL | 0 refills | Status: DC

## 2020-09-29 NOTE — Addendum Note (Signed)
Addended by: Carmelina Noun on: 09/29/2020 10:48 AM   Modules accepted: Orders

## 2020-09-29 NOTE — Progress Notes (Addendum)
Patient ID: Kathleen [name], female    DOB: 1961-08-23, 60 y.o.   MRN: [id number]   Chief Complaint  Patient presents with  . Cyst   Subjective:  CC: scalp abscess  This is a new problem.  Presents today for follow-up on right side of head scalp abscess.  Reports that she was out to eat, last evening in the pain was so bad that she went to [facility] emergency department.  Symptoms started 2 days ago, while at the emergency room last night, they did an incision and drainage and it was noted that there was moderate purulent drainage from the abscess area.  They wrote a prescription for her to be on doxycycline for 7 days.  She also reports urinary pain and burning, denies fever, chills, shortness of breath, chest pain.  Has chronic pain, on oxycodone given by her PCP.  She reports that she does experience shortness of breath, this is not new for her this is a chronic problem.  This will not be addressed today at this visit, she is in no obvious shortness of breath during the visit.   Follow up abscess of scalp. Went to ED last night.   Cyst under left arm. Came up a couple of days ago.    Medical History Kathleen [name] has a past medical history of Adrenal insufficiency ([place]), Anemia, Avascular bone necrosis ([place]), Breast fibrocystic disorder, Chronic pain, Encounter for long-term opiate analgesic use (02/02/2020), Fibromyalgia, Gastroesophageal reflux disease, Mitral valve prolapse, Orthostatic hypotension, Peripheral neuropathy, Restrictive lung disease, Sarcoidosis, and SOB (shortness of breath).   Outpatient Encounter Medications as of 09/29/2020  Medication Sig  . acetaminophen (TYLENOL) 500 MG tablet Take 1,000 mg by mouth every 8 (eight) hours as needed for headache.  . cetirizine-pseudoephedrine (ZYRTEC-D) 5-120 MG tablet Take 1 tablet by mouth daily.  Marland Kitchen docusate sodium (COLACE) 100 MG capsule Take 100 mg by mouth 2 (two) times daily.  Marland Kitchen doxycycline (VIBRAMYCIN) 100 MG capsule Take  1 capsule (100 mg total) by mouth 2 (two) times daily. One po bid x 7 days  . EPINEPHrine 0.3 mg/0.3 mL IJ SOAJ injection Inject 0.3 mLs (0.3 mg total) into the muscle as needed for anaphylaxis.  . ferrous sulfate 325 (65 FE) MG EC tablet Take 325 mg by mouth daily with breakfast.  . FLUoxetine (PROZAC) 20 MG tablet Take 1 tablet (20 mg total) by mouth daily.  . fluticasone (FLONASE) 50 MCG/ACT nasal spray Place 2 sprays into both nostrils daily.  . hydrocortisone (CORTEF) 10 MG tablet TAKE 2 TABLETS BY MOUTH DAILY AT 8 AM AND 1 TABLET DAILY AT NOON  . hydrOXYzine (ATARAX/VISTARIL) 25 MG tablet TAKE 1 TABLET BY MOUTH 3 TIMES DAILY AS NEEDED FOR ANXIETY  . levothyroxine (SYNTHROID) 75 MCG tablet Take 1 tablet (75 mcg total) by mouth daily before breakfast.  . LINZESS 145 MCG CAPS capsule TAKE 1 CAPSULE BY MOUTH ONCE A DAY BEFORE BREAKFAST  . methocarbamol (ROBAXIN) 500 MG tablet Take 1 tablet (500 mg total) by mouth 3 (three) times daily.  . midodrine (PROAMATINE) 5 MG tablet TAKE 1 TABLET BY MOUTH TWICE DAILY WITH A MEAL  . ondansetron (ZOFRAN-ODT) 8 MG disintegrating tablet Take 1 tablet (8 mg total) by mouth every 8 (eight) hours as needed for nausea or vomiting.  . Oxycodone HCl 10 MG TABS Take one tablet po q 4 hrs prn pain. Max 6 per day  . Oxycodone HCl 10 MG TABS Take one tablet po q 4  hours prn pain. Max 6 per day  . Oxycodone HCl 10 MG TABS Take one tablet po q 4 hours prn pain. Max 6 per day  . pantoprazole (PROTONIX) 40 MG tablet TAKE 1 TABLET BY MOUTH ONCE DAILY  . [DISCONTINUED] doxycycline (VIBRAMYCIN) 100 MG capsule Take 1 capsule (100 mg total) by mouth 2 (two) times daily. One po bid x 7 days  . [DISCONTINUED] levofloxacin (LEVAQUIN) 500 MG tablet Take 1 tablet (500 mg total) by mouth daily.   No facility-administered encounter medications on file as of 09/29/2020.     Review of Systems  Constitutional: Negative for chills and fever.  HENT: Negative for congestion.    Respiratory: Positive for shortness of breath. Negative for cough and chest tightness.        Not new shortness of breath.  Cardiovascular: Negative for chest pain.  Gastrointestinal: Positive for diarrhea. Negative for abdominal pain, constipation, nausea and vomiting.       Diarrhea x 2 yesterday.  Genitourinary: Positive for dysuria.       X 2 days  Musculoskeletal: Negative for back pain and neck pain.  Skin: Positive for wound.       Right side of scalp abscess. Went to ED last evening. Has not started doxy yet.      Vitals BP 122/60   Pulse (!) 110   Temp (!) 97.2 F (36.2 C)   Ht 5\' 10"  (1.778 m)   Wt 237 lb (107.5 kg)   LMP 05/17/2016   SpO2 95%   BMI 34.01 kg/m   Objective:   Physical Exam Vitals reviewed.  Constitutional:      Appearance: Normal appearance.  Cardiovascular:     Rate and Rhythm: Normal rate and regular rhythm.     Heart sounds: Normal heart sounds.  Pulmonary:     Effort: Pulmonary effort is normal.     Breath sounds: Normal breath sounds.  Skin:    General: Skin is warm and dry.     Findings: Lesion present.     Comments: right side of head, I&D per ED last night. Area with scab over it. Has not started antibiotic yet. Will start today.   Neurological:     General: No focal deficit present.     Mental Status: She is alert.  Psychiatric:        Behavior: Behavior normal.    Results for orders placed or performed in visit on 09/29/20  POCT urinalysis dipstick  Result Value Ref Range   Color, UA     Clarity, UA     Glucose, UA     Bilirubin, UA     Ketones, UA     Spec Grav, UA 1.020 1.010 - 1.025   Blood, UA     pH, UA 5.0 5.0 - 8.0   Protein, UA     Urobilinogen, UA     Nitrite, UA     Leukocytes, UA     Appearance     Odor       Assessment and Plan   1. Scalp abscess - doxycycline (VIBRAMYCIN) 100 MG capsule; Take 1 capsule (100 mg total) by mouth 2 (two) times daily. One po bid x 7 days  Dispense: 14 capsule;  Refill: 0  2. Dysuria   Patient has paper prescription from ED. Will shred and get one sent electronically today.  She is encouraged to go to the pharmacy right after this visit and to start the doxycycline ASAP.  Patient reports  dysuria, she performs self cath catherization, she is unable to give urine today in the office, she is encouraged to bring the urine back later today for assessment.  She is given a urine cup.  Update: Return to urine, negative for infection.  Agrees with plan of care discussed today. Understands warning signs to seek further care: chest pain, shortness of breath, any significant change in health.  Understands to follow-up in one week for re evaluation of scalp.  Bring urine sample back to the office for evaluation later today.   Pecolia Ades, NP 09/29/2020

## 2020-09-29 NOTE — Patient Instructions (Addendum)
Keep area clean with soap and water. Use ice pack for comfort, 20 minutes every 1-2 hours as needed.  Compete antibiotics as prescribed by ED. Follow up here in one week    Skin Abscess  A skin abscess is an infected area of your skin that contains pus and other material. An abscess can happen in any part of your body. Some abscesses break open (rupture) on their own. Most continue to get worse unless they are treated. The infection can spread deeper into the body and into your blood, which can make you feel sick. A skin abscess is caused by germs that enter the skin through a cut or scrape. It can also be caused by blocked oil and sweat glands or infected hair follicles. This condition is usually treated by:  Draining the pus.  Taking antibiotic medicines.  Placing a warm, wet washcloth over the abscess. Follow these instructions at home: Medicines  Take over-the-counter and prescription medicines only as told by your doctor.  If you were prescribed an antibiotic medicine, take it as told by your doctor. Do not stop taking the antibiotic even if you start to feel better.   Abscess care  If you have an abscess that has not drained, place a warm, clean, wet washcloth over the abscess several times a day. Do this as told by your doctor.  Follow instructions from your doctor about how to take care of your abscess. Make sure you: ? Cover the abscess with a bandage (dressing). ? Change your bandage or gauze as told by your doctor. ? Wash your hands with soap and water before you change the bandage or gauze. If you cannot use soap and water, use hand sanitizer.  Check your abscess every day for signs that the infection is getting worse. Check for: ? More redness, swelling, or pain. ? More fluid or blood. ? Warmth. ? More pus or a bad smell.   General instructions  To avoid spreading the infection: ? Do not share personal care items, towels, or hot tubs with others. ? Avoid making  skin-to-skin contact with other people.  Keep all follow-up visits as told by your doctor. This is important. Contact a doctor if:  You have more redness, swelling, or pain around your abscess.  You have more fluid or blood coming from your abscess.  Your abscess feels warm when you touch it.  You have more pus or a bad smell coming from your abscess.  You have a fever.  Your muscles ache.  You have chills.  You feel sick. Get help right away if:  You have very bad (severe) pain.  You see red streaks on your skin spreading away from the abscess. Summary  A skin abscess is an infected area of your skin that contains pus and other material.  The abscess is caused by germs that enter the skin through a cut or scrape. It can also be caused by blocked oil and sweat glands or infected hair follicles.  Follow your doctor's instructions on caring for your abscess, taking medicines, preventing infections, and keeping follow-up visits. This information is not intended to replace advice given to you by your health care provider. Make sure you discuss any questions you have with your health care provider. Document Revised: 03/19/2019 Document Reviewed: 09/26/2017 Elsevier Patient Education  2021 [employer].

## 2020-10-04 MED FILL — ONDANSETRON ODT 8 MG TABLET: 8 | 10 days supply | Qty: 30 | Fill #2

## 2020-10-06 ENCOUNTER — Encounter: Payer: Self-pay | Admitting: Family Medicine

## 2020-10-06 ENCOUNTER — Ambulatory Visit: Payer: 59 | Admitting: Family Medicine

## 2020-10-10 DIAGNOSIS — M25572 Pain in left ankle and joints of left foot: Secondary | ICD-10-CM | POA: Diagnosis not present

## 2020-10-11 ENCOUNTER — Telehealth: Payer: Self-pay

## 2020-10-11 MED FILL — PANTOPRAZOLE SOD DR 40 MG T: 40 | 90 days supply | Qty: 90 | Fill #0

## 2020-10-11 NOTE — Telephone Encounter (Signed)
Urine on 2/3 was negative.  If she is still having urinary symptoms, I would recommend another urine and we will send for culture. She can drop off at her convenience.   Thanks, KD

## 2020-10-11 NOTE — Telephone Encounter (Signed)
Discussed with pt. Pt verbalized understanding. Will pick up sterile cup and get a urine sample.

## 2020-10-11 NOTE — Telephone Encounter (Signed)
Kathleen [name] turned a urine test early last week and she never got a call back or no meds called in and she is still in pain and needs something called in.   Pt call back [phone] or  [phone]

## 2020-10-11 NOTE — Telephone Encounter (Signed)
Pt seen on 2/3 and could not give urine specimen at office because she uses caths and she brought one back that day but a note was not put in and the poc u/a was put in under dr scott's name so karen did not receive note. No urine culture sent. Should pt bring in another specimen or can you look at her dip from 2/3

## 2020-10-12 ENCOUNTER — Other Ambulatory Visit: Payer: Self-pay

## 2020-10-12 ENCOUNTER — Ambulatory Visit (INDEPENDENT_AMBULATORY_CARE_PROVIDER_SITE_OTHER): Payer: 59 | Admitting: Family Medicine

## 2020-10-12 ENCOUNTER — Other Ambulatory Visit: Payer: Self-pay | Admitting: Family Medicine

## 2020-10-12 VITALS — BP 138/88 | Ht 70.0 in | Wt 237.0 lb

## 2020-10-12 DIAGNOSIS — M87052 Idiopathic aseptic necrosis of left femur: Secondary | ICD-10-CM

## 2020-10-12 DIAGNOSIS — E2740 Unspecified adrenocortical insufficiency: Secondary | ICD-10-CM | POA: Diagnosis not present

## 2020-10-12 DIAGNOSIS — J849 Interstitial pulmonary disease, unspecified: Secondary | ICD-10-CM | POA: Diagnosis not present

## 2020-10-12 DIAGNOSIS — R300 Dysuria: Secondary | ICD-10-CM | POA: Diagnosis not present

## 2020-10-12 DIAGNOSIS — I272 Pulmonary hypertension, unspecified: Secondary | ICD-10-CM | POA: Insufficient documentation

## 2020-10-12 LAB — POCT URINALYSIS DIPSTICK
Spec Grav, UA: 1.015 (ref 1.010–1.025)
pH, UA: 6 (ref 5.0–8.0)

## 2020-10-12 MED FILL — HYDROXYZINE HCL 25 MG TABS: 25 | 20 days supply | Qty: 60 | Fill #2

## 2020-10-12 NOTE — Progress Notes (Signed)
Subjective:    Patient ID: Kathleen [name], female    DOB: Oct 08, 1960, 60 y.o.   MRN: [id number]  HPI  Patient arrives with left foot pain s/p fall- saw ortho and was put in boot and has MRI scheduled for Friday Patient with continuing dysuria. She denies high fever chills sweats below She does have a history of significant orthopedic troubles and now she has foot pain from a fall  Dysuria - Plan: POCT urinalysis dipstick  Pulmonary HTN (HCC)  Avascular necrosis of bone of hip, left (HCC), Chronic  Interstitial lung disease ([place]), Chronic  Adrenal insufficiency ([place]), Chronic  Please see below Results for orders placed or performed in visit on 10/12/20  POCT urinalysis dipstick  Result Value Ref Range   Color, UA     Clarity, UA     Glucose, UA     Bilirubin, UA     Ketones, UA     Spec Grav, UA 1.015 1.010 - 1.025   Blood, UA     pH, UA 6.0 5.0 - 8.0   Protein, UA     Urobilinogen, UA     Nitrite, UA     Leukocytes, UA     Appearance     Odor      Review of Systems  Constitutional: Negative for activity change and appetite change.  HENT: Negative for congestion and rhinorrhea.   Respiratory: Negative for cough and shortness of breath.   Cardiovascular: Negative for chest pain and leg swelling.  Gastrointestinal: Negative for abdominal pain, nausea and vomiting.  Skin: Negative for color change.  Neurological: Negative for dizziness and weakness.  Psychiatric/Behavioral: Negative for agitation and confusion.       Objective:   Physical Exam Vitals reviewed.  Constitutional:      General: She is not in acute distress.    Appearance: She is well-nourished.  HENT:     Head: Normocephalic and atraumatic.  Eyes:     General:        Right eye: No discharge.        Left eye: No discharge.  Neck:     Trachea: No tracheal deviation.  Cardiovascular:     Rate and Rhythm: Normal rate and regular rhythm.     Heart sounds: Normal heart sounds. No murmur  heard.   Pulmonary:     Effort: Pulmonary effort is normal. No respiratory distress.     Breath sounds: Normal breath sounds.  Musculoskeletal:        General: No edema.  Lymphadenopathy:     Cervical: No cervical adenopathy.  Skin:    General: Skin is warm and dry.  Neurological:     Mental Status: She is alert.     Coordination: Coordination normal.  Psychiatric:        Mood and Affect: Mood and affect normal.        Behavior: Behavior normal.    She is stressed about the fall she took in the foot injury she took and as a result she is worried that MRI will show a fracture she is trying the best she can to deal with this but she is very stressed about these things       Assessment & Plan:  1. Dysuria Urine looks good did not do a culture but we will offer her the ability become fine give a urine for culture - POCT urinalysis dipstick  2. Pulmonary HTN ([place]) She does have underlying pulmonary fibrosis with pulmonary hypertension  followed by Dr. Farley Ly at [facility] stable currently  3. Avascular necrosis of bone of hip, left (HCC) Has avascular necrosis of the hip unfortunately not a surgical candidate currently because of her lung related issues.  4. Interstitial lung disease ([place]) Has lung related issues followed by Dr. Farley Ly currently stable  5. Adrenal insufficiency (HCC) Has adrenal insufficiency stable being followed by Dr. Dorris Fetch  She has a foot injury I looked at it it is bruised up some swelling.  She is wearing surgical boot she will follow up with orthopedic surgeon for MRI and she will keep [place] posted

## 2020-10-12 NOTE — Patient Instructions (Signed)
Stop flexeril  Urine culture will be done over the next few days we will call you the result

## 2020-10-13 NOTE — Progress Notes (Signed)
10/13/2020-contacted patient and pt states she will come by tomorrow to give urine sample to send for culture.

## 2020-10-14 ENCOUNTER — Telehealth: Payer: Self-pay | Admitting: Family Medicine

## 2020-10-14 ENCOUNTER — Other Ambulatory Visit: Payer: Self-pay | Admitting: Family Medicine

## 2020-10-14 DIAGNOSIS — R300 Dysuria: Secondary | ICD-10-CM

## 2020-10-14 DIAGNOSIS — M25572 Pain in left ankle and joints of left foot: Secondary | ICD-10-CM | POA: Diagnosis not present

## 2020-10-14 MED FILL — LINZESS 145 MCG CAPSULE: 145 | 90 days supply | Qty: 90 | Fill #0

## 2020-10-14 NOTE — Telephone Encounter (Signed)
Patient left urine sample in door in restroom.

## 2020-10-14 NOTE — Telephone Encounter (Signed)
Urine culture ordered per Dr.Scott (Cc'd chart request pt drop off urine for culture). Urine sent for culture

## 2020-10-16 LAB — URINE CULTURE

## 2020-10-16 LAB — SPECIMEN STATUS REPORT

## 2020-10-17 MED FILL — FLUOXETINE HCL 20 MG TABS: 20 | 90 days supply | Qty: 90 | Fill #0

## 2020-10-19 DIAGNOSIS — J9601 Acute respiratory failure with hypoxia: Secondary | ICD-10-CM | POA: Diagnosis not present

## 2020-10-19 DIAGNOSIS — M6281 Muscle weakness (generalized): Secondary | ICD-10-CM | POA: Diagnosis not present

## 2020-10-19 DIAGNOSIS — J849 Interstitial pulmonary disease, unspecified: Secondary | ICD-10-CM | POA: Diagnosis not present

## 2020-10-19 DIAGNOSIS — D860 Sarcoidosis of lung: Secondary | ICD-10-CM | POA: Diagnosis not present

## 2020-10-19 NOTE — Telephone Encounter (Signed)
Nurse please close .

## 2020-10-20 DIAGNOSIS — M79672 Pain in left foot: Secondary | ICD-10-CM | POA: Diagnosis not present

## 2020-10-24 ENCOUNTER — Other Ambulatory Visit: Payer: Self-pay

## 2020-10-24 ENCOUNTER — Other Ambulatory Visit (HOSPITAL_COMMUNITY): Payer: Self-pay | Admitting: Orthopedic Surgery

## 2020-10-24 ENCOUNTER — Ambulatory Visit (HOSPITAL_COMMUNITY)
Admission: RE | Admit: 2020-10-24 | Discharge: 2020-10-24 | Disposition: A | Discharge status: Home / Self Care | Payer: 59 | Source: Ambulatory Visit | Attending: Orthopedic Surgery | Admitting: Orthopedic Surgery

## 2020-10-24 DIAGNOSIS — M79605 Pain in left leg: Secondary | ICD-10-CM | POA: Insufficient documentation

## 2020-10-24 DIAGNOSIS — M79672 Pain in left foot: Secondary | ICD-10-CM | POA: Diagnosis not present

## 2020-10-26 MED FILL — oxyCODONE HCL 10 MG TABS: 10 | 30 days supply | Qty: 180 | Fill #0

## 2020-10-26 MED FILL — HYDROCORTISONE 10 MG TABLET: 10 | 90 days supply | Qty: 270 | Fill #1

## 2020-11-01 DIAGNOSIS — M5410 Radiculopathy, site unspecified: Secondary | ICD-10-CM | POA: Diagnosis not present

## 2020-11-01 DIAGNOSIS — Z8249 Family history of ischemic heart disease and other diseases of the circulatory system: Secondary | ICD-10-CM | POA: Diagnosis not present

## 2020-11-01 DIAGNOSIS — S92901D Unspecified fracture of right foot, subsequent encounter for fracture with routine healing: Secondary | ICD-10-CM | POA: Diagnosis not present

## 2020-11-01 DIAGNOSIS — F32A Depression, unspecified: Secondary | ICD-10-CM | POA: Diagnosis not present

## 2020-11-01 DIAGNOSIS — R110 Nausea: Secondary | ICD-10-CM | POA: Diagnosis not present

## 2020-11-01 DIAGNOSIS — D860 Sarcoidosis of lung: Secondary | ICD-10-CM | POA: Diagnosis not present

## 2020-11-01 DIAGNOSIS — Z9981 Dependence on supplemental oxygen: Secondary | ICD-10-CM | POA: Diagnosis not present

## 2020-11-01 DIAGNOSIS — R5383 Other fatigue: Secondary | ICD-10-CM | POA: Diagnosis not present

## 2020-11-01 DIAGNOSIS — K219 Gastro-esophageal reflux disease without esophagitis: Secondary | ICD-10-CM | POA: Diagnosis not present

## 2020-11-01 DIAGNOSIS — Z803 Family history of malignant neoplasm of breast: Secondary | ICD-10-CM | POA: Diagnosis not present

## 2020-11-01 DIAGNOSIS — Z8616 Personal history of COVID-19: Secondary | ICD-10-CM | POA: Diagnosis not present

## 2020-11-01 DIAGNOSIS — R0602 Shortness of breath: Secondary | ICD-10-CM | POA: Diagnosis not present

## 2020-11-01 DIAGNOSIS — J849 Interstitial pulmonary disease, unspecified: Secondary | ICD-10-CM | POA: Diagnosis not present

## 2020-11-01 DIAGNOSIS — Z2821 Immunization not carried out because of patient refusal: Secondary | ICD-10-CM | POA: Diagnosis not present

## 2020-11-01 DIAGNOSIS — I959 Hypotension, unspecified: Secondary | ICD-10-CM | POA: Diagnosis not present

## 2020-11-01 DIAGNOSIS — R4589 Other symptoms and signs involving emotional state: Secondary | ICD-10-CM | POA: Diagnosis not present

## 2020-11-01 DIAGNOSIS — Z634 Disappearance and death of family member: Secondary | ICD-10-CM | POA: Diagnosis not present

## 2020-11-01 DIAGNOSIS — E2740 Unspecified adrenocortical insufficiency: Secondary | ICD-10-CM | POA: Diagnosis not present

## 2020-11-01 DIAGNOSIS — F419 Anxiety disorder, unspecified: Secondary | ICD-10-CM | POA: Diagnosis not present

## 2020-11-01 DIAGNOSIS — J45909 Unspecified asthma, uncomplicated: Secondary | ICD-10-CM | POA: Diagnosis not present

## 2020-11-08 ENCOUNTER — Other Ambulatory Visit: Payer: Self-pay | Admitting: Family Medicine

## 2020-11-08 ENCOUNTER — Other Ambulatory Visit: Payer: Self-pay

## 2020-11-08 ENCOUNTER — Ambulatory Visit (INDEPENDENT_AMBULATORY_CARE_PROVIDER_SITE_OTHER): Payer: 59 | Admitting: Family Medicine

## 2020-11-08 VITALS — BP 136/76

## 2020-11-08 DIAGNOSIS — R300 Dysuria: Secondary | ICD-10-CM | POA: Diagnosis not present

## 2020-11-08 DIAGNOSIS — R109 Unspecified abdominal pain: Secondary | ICD-10-CM | POA: Diagnosis not present

## 2020-11-08 LAB — POCT URINALYSIS DIPSTICK
Protein, UA: POSITIVE — AB
Spec Grav, UA: 1.01 (ref 1.010–1.025)
pH, UA: 6 (ref 5.0–8.0)

## 2020-11-08 NOTE — Progress Notes (Signed)
   Subjective:    Patient ID: Kathleen [name], female    DOB: [birth year], 60 y.o.   MRN: [id number]  HPI Pt having fatigue, left side pain and breathing not as good. Pt states this has been going on for a couple of days.  Patient with intermittent side pain states she feels like her breathing may be compromised she does not know she has any lung infection currently denies any wheezing or difficulty breathing recently saw her pulmonary doctor who felt like things were going well she does relate some slight dysuria denies fever chills sweats  Review of Systems See above   Has had some diarrhea Objective:   Physical Exam  Lungs clear heart regular pulse normal Urine with some leukocytes but await the culture.  Hold off on antibiotics mainly because with her having diarrhea I am concerned that if we add an antibiotic currently we could cause troubles      Assessment & Plan:  Dysuria await culture I find no evidence of pneumonia or lung related issues Follow-up as needed on a regular basis  Patient feels that she finds her self feeling very fatigued tired wonders if her adrenal insufficiency is causing her troubles.  Blood pressure seems to be doing good not tachycardic.  Patient has follow-up before the visit in April with endocrinology

## 2020-11-09 ENCOUNTER — Telehealth: Payer: Self-pay

## 2020-11-09 ENCOUNTER — Ambulatory Visit: Payer: 59 | Admitting: "Endocrinology

## 2020-11-09 DIAGNOSIS — E039 Hypothyroidism, unspecified: Secondary | ICD-10-CM | POA: Diagnosis not present

## 2020-11-09 DIAGNOSIS — E2740 Unspecified adrenocortical insufficiency: Secondary | ICD-10-CM | POA: Diagnosis not present

## 2020-11-09 NOTE — Telephone Encounter (Signed)
Called pt, advised her what she needs to do.

## 2020-11-09 NOTE — Telephone Encounter (Signed)
She can do her labs today. If she is too sick to do her labs or if she is vomiting and can not keep her medications down, she has to go to ER for acute care.

## 2020-11-09 NOTE — Telephone Encounter (Signed)
Patient is asking for a call back because she is having issues with her Adrenal Insufficeny. She said she feels like she is going to end up back in the hospital. Do you want her to go ahead and get her labs done today? She is wanting to come in and see you. She said her medicine needs adjusted for this issue

## 2020-11-10 ENCOUNTER — Encounter: Payer: Self-pay | Admitting: "Endocrinology

## 2020-11-10 ENCOUNTER — Ambulatory Visit (INDEPENDENT_AMBULATORY_CARE_PROVIDER_SITE_OTHER): Payer: 59 | Admitting: "Endocrinology

## 2020-11-10 ENCOUNTER — Other Ambulatory Visit: Payer: Self-pay

## 2020-11-10 VITALS — BP 122/68 | HR 64 | Ht 70.0 in | Wt 240.6 lb

## 2020-11-10 DIAGNOSIS — E2740 Unspecified adrenocortical insufficiency: Secondary | ICD-10-CM

## 2020-11-10 DIAGNOSIS — E039 Hypothyroidism, unspecified: Secondary | ICD-10-CM

## 2020-11-10 LAB — COMPREHENSIVE METABOLIC PANEL
ALT: 11 IU/L (ref 0–32)
AST: 13 IU/L (ref 0–40)
Albumin/Globulin Ratio: 1.9 (ref 1.2–2.2)
Albumin: 3.9 g/dL (ref 3.8–4.9)
Alkaline Phosphatase: 90 IU/L (ref 44–121)
BUN/Creatinine Ratio: 21 (ref 9–23)
BUN: 18 mg/dL (ref 6–24)
Bilirubin Total: <0.2 mg/dL (ref 0.0–1.2)
CO2: 21 mmol/L (ref 20–29)
Calcium: 9.5 mg/dL (ref 8.7–10.2)
Chloride: 104 mmol/L (ref 96–106)
Creatinine, Ser: 0.84 mg/dL (ref 0.57–1.00)
Globulin, Total: 2.1 g/dL (ref 1.5–4.5)
Glucose: 118 mg/dL — ABNORMAL HIGH (ref 65–99)
Potassium: 4.5 mmol/L (ref 3.5–5.2)
Sodium: 140 mmol/L (ref 134–144)
Total Protein: 6 g/dL (ref 6.0–8.5)
eGFR: 80 mL/min/{1.73_m2} (ref >59)

## 2020-11-10 LAB — URINE CULTURE

## 2020-11-10 LAB — T4, FREE: Free T4: 1.32 ng/dL (ref 0.82–1.77)

## 2020-11-10 LAB — TSH: TSH: 0.307 u[IU]/mL — ABNORMAL LOW (ref 0.450–4.500)

## 2020-11-10 NOTE — Progress Notes (Signed)
11/10/2020, 4:59 PM      Endocrinology follow-up note    Subjective:    Patient ID: Kathleen [name], female    DOB: June 21, 1961, PCP [name], MD   Past Medical History:  Diagnosis Date  . Adrenal insufficiency ([place])   . Anemia   . Avascular bone necrosis ([place])   . Breast fibrocystic disorder   . Chronic pain   . Encounter for long-term opiate analgesic use 02/02/2020   Patient has chronic pain with femoral head avascular necrosis.  Is under pain contract  . Fibromyalgia   . Gastroesophageal reflux disease   . Mitral valve prolapse   . Orthostatic hypotension   . Peripheral neuropathy   . Restrictive lung disease    Moderate to severe  . Sarcoidosis    Biopsy proven - [facility]  . SOB (shortness of breath)    chronic   Past Surgical History:  Procedure Laterality Date  . BIOPSY  10/17/2018   Procedure: BIOPSY;  Surgeon: Rogene Houston, MD;  Location: [facility];  Service: Endoscopy;;  duodenum, antrum, gastric body  . BREAST LUMPECTOMY  01/12/2011   right  . ESOPHAGOGASTRODUODENOSCOPY (EGD) WITH PROPOFOL N/A 10/17/2018   Procedure: ESOPHAGOGASTRODUODENOSCOPY (EGD) WITH PROPOFOL;  Surgeon: Rogene Houston, MD;  Location: [facility];  Service: Endoscopy;  Laterality: N/A;  2:25  . TUBAL LIGATION     Social History   Socioeconomic History  . Marital status: Married    Spouse name: Not on file  . Number of children: 1  . Years of education: Not on file  . Highest education level: Not on file  Occupational History  . Occupation: Teacher, early years/pre: [employer]  Tobacco Use  . Smoking status: Never Smoker  . Smokeless tobacco: Never Used  Vaping Use  . Vaping Use: Never used  Substance and Sexual Activity  . Alcohol use: No  . Drug use: No  . Sexual activity: Not Currently    Birth control/protection: Post-menopausal  Other Topics Concern  . Not on file  Social History Narrative   Daily  caffeine    Social Determinants of Health   Financial Resource Strain: Not on file  Food Insecurity: Not on file  Transportation Needs: Not on file  Physical Activity: Not on file  Stress: Not on file  Social Connections: Not on file   Family History  Problem Relation Age of Onset  . Cardiomyopathy Mother   . Breast cancer Mother   . Prostate cancer Father   . Neurofibromatosis Brother   . Heart disease Maternal Aunt   . Bipolar disorder Daughter   . Colon cancer Neg Hx    Outpatient Encounter Medications as of 11/10/2020  Medication Sig  . predniSONE (DELTASONE) 10 MG tablet Take 10 mg by mouth daily with breakfast.  . acetaminophen (TYLENOL) 500 MG tablet Take 1,000 mg by mouth every 8 (eight) hours as needed for headache.  . cetirizine-pseudoephedrine (ZYRTEC-D) 5-120 MG tablet Take 1 tablet by mouth daily.  Marland Kitchen docusate sodium (COLACE) 100 MG capsule Take 100 mg by mouth 2 (two) times daily.  Marland Kitchen EPINEPHrine 0.3 mg/0.3 mL IJ SOAJ injection Inject 0.3 mLs (0.3 mg total) into  the muscle as needed for anaphylaxis.  . ferrous sulfate 325 (65 FE) MG EC tablet Take 325 mg by mouth daily with breakfast.  . FLUoxetine (PROZAC) 20 MG tablet TAKE 1 TABLET BY MOUTH ONCE A DAY  . fluticasone (FLONASE) 50 MCG/ACT nasal spray Place 2 sprays into both nostrils daily.  . hydrocortisone (CORTEF) 10 MG tablet TAKE 2 TABLETS BY MOUTH DAILY AT 8 AM AND 1 TABLET DAILY AT NOON  . hydrOXYzine (ATARAX/VISTARIL) 25 MG tablet TAKE 1 TABLET BY MOUTH 3 TIMES DAILY AS NEEDED FOR ANXIETY  . levothyroxine (SYNTHROID) 75 MCG tablet Take 1 tablet (75 mcg total) by mouth daily before breakfast.  . LINZESS 145 MCG CAPS capsule TAKE 1 CAPSULE BY MOUTH ONCE A DAY BEFORE BREAKFAST  . methocarbamol (ROBAXIN) 500 MG tablet Take 1 tablet (500 mg total) by mouth 3 (three) times daily.  . midodrine (PROAMATINE) 5 MG tablet TAKE 1 TABLET BY MOUTH TWICE DAILY WITH A MEAL  . ondansetron (ZOFRAN-ODT) 8 MG disintegrating tablet  Take 1 tablet (8 mg total) by mouth every 8 (eight) hours as needed for nausea or vomiting.  . Oxycodone HCl 10 MG TABS Take one tablet po q 4 hrs prn pain. Max 6 per day  . Oxycodone HCl 10 MG TABS Take one tablet po q 4 hours prn pain. Max 6 per day  . Oxycodone HCl 10 MG TABS Take one tablet po q 4 hours prn pain. Max 6 per day  . pantoprazole (PROTONIX) 40 MG tablet TAKE 1 TABLET BY MOUTH ONCE DAILY   No facility-administered encounter medications on file as of 11/10/2020.   ALLERGIES: Allergies  Allergen Reactions  . Bee Venom Anaphylaxis  . Bactrim [Sulfamethoxazole-Trimethoprim] Nausea And Vomiting  . Codeine Itching  . Contrast Media [Iodinated Diagnostic Agents]     CT chest w/ contrast at OSH followed by SOB resolved w/ single dose steroids, never ENT swelling, intubation or pressors, has had multiple contrasted scans since  . Morphine And Related Other (See Comments)    Unknown reaction- pt gets sick, dizzy, and confused Intolerance, not an allergy, received this in the emergency department recently without a problem.    VACCINATION STATUS: Immunization History  Administered Date(s) Administered  . Influenza Whole 06/27/2010, 07/28/2011  . Moderna Sars-Covid-2 Vaccination 10/27/2019, 11/27/2019  . Pneumococcal Conjugate-13 06/15/2014  . Pneumococcal Polysaccharide-23 06/10/2013    HPI Kathleen [name] is 60 y.o. female who is being seen in follow-up in the management of adrenal insufficiency, hypothyroidism.  -She is on a stable dose of steroids including hydrocortisone 30 mg a day, prednisone 10 mg daily.  She continues to complain of fatigue.  She denies nausea, vomiting, abdominal pain.  She has occasional diarrhea. -Patient largely avoided ER visits in recent month. - Her history is complicated including sarcoidosis which she dealt with for more than the last 10 years.  She is on regular follow-up with her pulmonologist.  Over the years, her therapy included high-dose  steroids, up to 60 mg daily of prednisone, for the last 4-6 years.  She is currently on maintenance dose of prednisone 10 mg p.o. daily.  For adrenal  insufficiency, she was initiated and kept on hydrocortisone. She is currently on 20 mg p.o. every morning and 10 mg p.o. daily every noon.     -She reports better consistency taking her medications this time.  She also has hypothyroidism on levothyroxine 75 mcg p.o. every morning.  -She has significant cognitive deficit.  She continues to  have progressive weight gain up to 240 pounds currently.  - The details of her diagnosis with adrenal insufficiency  is not available to review. -She is not wearing her medic alert today.  . She denies any history of tuberculosis, incarceration, nor overseas travel . -She denies family history of adrenal, pituitary dysfunction. She has family history of hypothyroidism in one sibling. - Denies any history of intra-abdominal injury. She is on hydrocodone for chronic pain and inhaled bronchodilators. She is known to have orthostatic hypotension for which she is on midodrine.    Review of Systems Limited as above.  Objective:    BP 122/68   Pulse 64   Ht 5\' 10"  (1.778 m)   Wt 240 lb 9.6 oz (109.1 kg)   LMP 05/17/2016   BMI 34.52 kg/m   Wt Readings from Last 3 Encounters:  11/10/20 240 lb 9.6 oz (109.1 kg)  10/12/20 237 lb (107.5 kg)  09/29/20 237 lb (107.5 kg)    Physical Exam    CMP ( most recent) CMP     Component Value Date/Time   NA 140 11/09/2020 1053   K 4.5 11/09/2020 1053   CL 104 11/09/2020 1053   CO2 21 11/09/2020 1053   GLUCOSE 118 (H) 11/09/2020 1053   GLUCOSE 94 06/28/2020 2027   BUN 18 11/09/2020 1053   CREATININE 0.84 11/09/2020 1053   CREATININE 0.85 08/26/2014 0831   CALCIUM 9.5 11/09/2020 1053   PROT 6.0 11/09/2020 1053   ALBUMIN 3.9 11/09/2020 1053   AST 13 11/09/2020 1053   ALT 11 11/09/2020 1053   ALKPHOS 90 11/09/2020 1053   BILITOT <0.2 11/09/2020 1053    GFRNONAA >60 06/28/2020 2027   GFRAA 92 05/24/2020 1038     Diabetic Labs (most recent): Lab Results  Component Value Date   HGBA1C 5.4 02/03/2019   HGBA1C 5.0 12/31/2016   HGBA1C 5.3 08/21/2016     Lipid Panel ( most recent) Lipid Panel     Component Value Date/Time   CHOL 213 (H) 01/06/2020 0816   TRIG 182 (H) 01/06/2020 0816   HDL 48 01/06/2020 0816   CHOLHDL 4.4 01/06/2020 0816   CHOLHDL 4.0 08/26/2014 0831   VLDL 31 08/26/2014 0831   LDLCALC 133 (H) 01/06/2020 0816      Lab Results  Component Value Date   TSH 0.307 (L) 11/09/2020   TSH 0.548 05/24/2020   TSH 0.689 11/20/2019   TSH 0.364 (L) 06/29/2019   TSH 0.746 02/03/2019   TSH 1.290 08/07/2018   TSH 2.280 04/17/2018   TSH 0.527 12/04/2017   TSH 0.242 (L) 10/22/2017   TSH 0.769 10/15/2017   FREET4 1.32 11/09/2020   FREET4 1.46 05/24/2020   FREET4 1.25 11/20/2019   FREET4 1.43 06/29/2019   FREET4 1.32 02/03/2019   FREET4 1.38 08/09/2017   FREET4 1.15 04/19/2017   FREET4 0.99 12/31/2016      Assessment & Plan:   1. Adrenal insufficiency ([place]) -I have reviewed her new set of labs, available records and clinically evaluated this patient. She does have multiple risk factors for adrenal insufficiency  including exposure to high-dose steroids related to her sarcoidosis, as well as opioid therapy related to diffuse pain. -  She has taken prednisone up to 60 mg daily for up to 6 years prior to her diagnosis with adrenal insufficiency. -Her SPO2 is 94%, 5 to 6% deficit could be a reason for her fatigue.  She will not need any higher dose of steroids than what she  takes. -She is on a stable dose of hydrocortisone 20 mg a.m. and 10 mg daily at noon in addition to her prednisone 10 mg started related to her sarcoidosis.  I had a lengthy discussion with her about the need to avoid any further unnecessary escalation on steroid replacement.    She is advised to remain on same steroid replacement dose and  schedule.   -She will not be considered for work-up by withdrawing steroids supports,  risk of adrenal crisis. -She does not have enough support to perform steroid injection at home. -She is advised to go to ER if she cannot keep her medications down due to intractable vomiting. - She will be evaluated for the need for mineralocorticoid replacement which may help to stabilize her blood pressure. She is currently on medodrine 5 mg by mouth twice a day, maintaining her blood pressure within normal range. - I had a long discussion with her regarding steroids sick day rules, risk of unintended weight gain/diabetes and the need for periodic screening with A1c.  -Her last A1c was 5% indicating absence of prediabetes/diabetes.    2. Hypothyroidism:  -Her recent labs for thyroid function are consistent with appropriate replacement.  She is advised to continue levothyroxine 75 mcg p.o. daily before breakfast.       This was clearly communicated to the patient, and she voices understanding.  - We discussed about the correct intake of her thyroid hormone, on empty stomach at fasting, with water, separated by at least 30 minutes from breakfast and other medications,  and separated by more than 4 hours from calcium, iron, multivitamins, acid reflux medications (PPIs). -Patient is made aware of the fact that thyroid hormone replacement is needed for life, dose to be adjusted by periodic monitoring of thyroid function tests.  - I advised her  to maintain close follow up with [name], MD for primary care needs.      - Time spent on this patient care encounter:  30 minutes of which 50% was spent in  counseling and the rest reviewing  her current and  previous labs / studies and medications  doses and developing a plan for long term care, and documenting this care. Kathleen [name]  participated in the discussions, expressed understanding, and voiced agreement with the above plans.  All questions were  answered to her satisfaction. she is encouraged to contact clinic should she have any questions or concerns prior to her return visit.    Follow up plan: Return in about 6 months (around 05/13/2021) for F/U with Pre-visit Labs.   Glade Lloyd, MD [facility] [address] [place], [place] [address] Phone: [phone]  Fax: [phone]     11/10/2020, 4:59 PM  This note was partially dictated with voice recognition software. Similar sounding words can be transcribed inadequately or may not  be corrected upon review.

## 2020-11-10 NOTE — Progress Notes (Signed)
Experiencing difficulty sleeping at night, increased fatigue x 1 month.

## 2020-11-11 ENCOUNTER — Ambulatory Visit: Payer: 59 | Admitting: Family Medicine

## 2020-11-15 ENCOUNTER — Ambulatory Visit (HOSPITAL_COMMUNITY)
Admission: RE | Admit: 2020-11-15 | Discharge: 2020-11-15 | Disposition: A | Discharge status: Home / Self Care | Payer: 59 | Source: Ambulatory Visit | Attending: Family Medicine | Admitting: Family Medicine

## 2020-11-15 ENCOUNTER — Other Ambulatory Visit (HOSPITAL_COMMUNITY)
Admission: RE | Admit: 2020-11-15 | Discharge: 2020-11-15 | Disposition: A | Discharge status: Home / Self Care | Payer: 59 | Source: Ambulatory Visit | Attending: Family Medicine | Admitting: Family Medicine

## 2020-11-15 ENCOUNTER — Ambulatory Visit (INDEPENDENT_AMBULATORY_CARE_PROVIDER_SITE_OTHER): Payer: 59 | Admitting: Family Medicine

## 2020-11-15 ENCOUNTER — Other Ambulatory Visit: Payer: Self-pay

## 2020-11-15 ENCOUNTER — Encounter: Payer: Self-pay | Admitting: Family Medicine

## 2020-11-15 VITALS — BP 128/80 | HR 94 | Temp 97.8°F | Ht 70.0 in | Wt 232.0 lb

## 2020-11-15 DIAGNOSIS — D869 Sarcoidosis, unspecified: Secondary | ICD-10-CM | POA: Diagnosis not present

## 2020-11-15 DIAGNOSIS — R0609 Other forms of dyspnea: Secondary | ICD-10-CM

## 2020-11-15 DIAGNOSIS — M79661 Pain in right lower leg: Secondary | ICD-10-CM

## 2020-11-15 DIAGNOSIS — J849 Interstitial pulmonary disease, unspecified: Secondary | ICD-10-CM

## 2020-11-15 DIAGNOSIS — M79662 Pain in left lower leg: Secondary | ICD-10-CM

## 2020-11-15 DIAGNOSIS — E038 Other specified hypothyroidism: Secondary | ICD-10-CM

## 2020-11-15 DIAGNOSIS — R300 Dysuria: Secondary | ICD-10-CM

## 2020-11-15 DIAGNOSIS — R0600 Dyspnea, unspecified: Secondary | ICD-10-CM

## 2020-11-15 DIAGNOSIS — E2740 Unspecified adrenocortical insufficiency: Secondary | ICD-10-CM

## 2020-11-15 LAB — D-DIMER, QUANTITATIVE: D-Dimer, Quant: 0.37 ug/mL-FEU (ref 0.00–0.50)

## 2020-11-15 NOTE — Progress Notes (Signed)
Subjective:    Patient ID: Kathleen [name], female    DOB: [birth year], 60 y.o.   MRN: [id number]  HPI pt states she has pain in calf of both legs for several days.   Pt declined to do phq9.   Pt  to collect urine for repeat urine culture.   This patient was seen today for chronic pain  The medication list was reviewed and updated.   -Compliance with medication: 5 -6  - Number patient states they take daily: 5 -6   -when was the last dose patient took? today  The patient was advised the importance of maintaining medication and not using illegal substances with these.  Here for refills and follow up  The patient was educated that we can provide 3 monthly scripts for their medication, it is their responsibility to follow the instructions.  Side effects or complications from medications: none  Patient is aware that pain medications are meant to minimize the severity of the pain to allow their pain levels to improve to allow for better function. They are aware of that pain medications cannot totally remove their pain.  Due for UDT ( at least once per year) : last one done 05/11/20  Scale of 1 to 10 ( 1 is least 10 is most) Your pain level without the medicine: Pt cant give number states pain is still bad. Hurts all the time.   Your pain level with medication. Pt cant give number states pain is still bad. Hurts all the time.  Patient does relate that the pain medicine does help her function better throughout the day.  She denies drowsiness with the medicine.  Scale 1 to 10 ( 1-helps very little, 10 helps very well) How well does your pain medication reduce your pain so you can function better through out the day? Pt cant give number states pain is still bad. Hurts all the time.   Calf pain for 3 days Ache and pain Cant stand long Trying stretches or massage No known injury   Dysuria - Plan: Urine Culture  Interstitial lung disease ([place])  Adrenal insufficiency  (HCC)  Other specified hypothyroidism  DOE (dyspnea on exertion)  Bilateral calf pain - Plan: US Venous Img Lower Bilateral, D-dimer, quantitative, CANCELED: D-dimer, quantitative     Review of Systems  Constitutional: Negative for activity change and appetite change.  HENT: Negative for congestion and rhinorrhea.   Respiratory: Negative for cough and shortness of breath.   Cardiovascular: Negative for chest pain and leg swelling.  Gastrointestinal: Negative for abdominal pain, nausea and vomiting.  Skin: Negative for color change.  Neurological: Negative for dizziness and weakness.  Psychiatric/Behavioral: Negative for agitation and confusion.       Objective:   Physical Exam Vitals reviewed.  Constitutional:      General: She is not in acute distress. HENT:     Head: Normocephalic.  Cardiovascular:     Rate and Rhythm: Normal rate and regular rhythm.     Heart sounds: Normal heart sounds. No murmur heard.   Pulmonary:     Effort: Pulmonary effort is normal.     Breath sounds: Normal breath sounds.  Lymphadenopathy:     Cervical: No cervical adenopathy.  Neurological:     Mental Status: She is alert.  Psychiatric:        Behavior: Behavior normal.           Assessment & Plan:  1. Dysuria Urine culture pending no obvious infection currently -  Urine Culture  2. Interstitial lung disease ([place]) Being followed by Dr. Farley Ly at [facility].  Patient will be having CT scan at [facility] in the near future.  Patient's resting O2 saturation 93% after walking 1 lab in our office which is approximately 100 feet her oxygen went down to 90%.  3. Adrenal insufficiency ([place]) She is followed by Dr. Dorris Fetch.  States is under good control currently.  4. Other specified hypothyroidism Takes her levothyroxine currently.  Lab work and look good.  5. DOE (dyspnea on exertion) Patient does get out of breath with activity I believe that this is related to her interstitial lung disease.   Unfortunately long-term this will probably get worse for her.  Hopefully she will do her best with this  6. Bilateral calf pain Significant bilateral calf pain and discomfort.  Stretches were recommended.  D-dimer came back negative.  Ultrasound negative.  No sign of blood clot.  No need to do any type of stat CT scan of the chest If progressive troubles or worse follow-up   - US Venous Img Lower Bilateral - D-dimer, quantitative   The patient was seen in followup for chronic pain. A review over at their current pain status was discussed. Drug registry was checked. Prescriptions were given.  Regular follow-up recommended. Discussion was held regarding the importance of compliance with medication as well as pain medication contract.  Patient was informed that medication may cause drowsiness and should not be combined  with other medications/alcohol or street drugs. If the patient feels medication is causing altered alertness then do not drive or operate dangerous equipment.  Drug registry was checked.  Prescriptions were sent in.  Patient was encouraged to stick with the medication plan.  It does allow her to function better with her hip and back pain.

## 2020-11-16 DIAGNOSIS — M6281 Muscle weakness (generalized): Secondary | ICD-10-CM | POA: Diagnosis not present

## 2020-11-16 DIAGNOSIS — J9601 Acute respiratory failure with hypoxia: Secondary | ICD-10-CM | POA: Diagnosis not present

## 2020-11-16 DIAGNOSIS — D860 Sarcoidosis of lung: Secondary | ICD-10-CM | POA: Diagnosis not present

## 2020-11-16 DIAGNOSIS — J849 Interstitial pulmonary disease, unspecified: Secondary | ICD-10-CM | POA: Diagnosis not present

## 2020-11-17 LAB — URINE CULTURE

## 2020-11-19 DIAGNOSIS — J849 Interstitial pulmonary disease, unspecified: Secondary | ICD-10-CM | POA: Diagnosis not present

## 2020-11-19 DIAGNOSIS — M6281 Muscle weakness (generalized): Secondary | ICD-10-CM | POA: Diagnosis not present

## 2020-11-19 DIAGNOSIS — D860 Sarcoidosis of lung: Secondary | ICD-10-CM | POA: Diagnosis not present

## 2020-11-19 DIAGNOSIS — J9601 Acute respiratory failure with hypoxia: Secondary | ICD-10-CM | POA: Diagnosis not present

## 2020-11-23 ENCOUNTER — Telehealth: Payer: Self-pay | Admitting: Family Medicine

## 2020-11-23 ENCOUNTER — Other Ambulatory Visit: Payer: Self-pay | Admitting: *Deleted

## 2020-11-23 ENCOUNTER — Other Ambulatory Visit: Payer: Self-pay | Admitting: Family Medicine

## 2020-11-23 DIAGNOSIS — R0602 Shortness of breath: Secondary | ICD-10-CM | POA: Diagnosis not present

## 2020-11-23 DIAGNOSIS — J984 Other disorders of lung: Secondary | ICD-10-CM | POA: Diagnosis not present

## 2020-11-23 DIAGNOSIS — J849 Interstitial pulmonary disease, unspecified: Secondary | ICD-10-CM | POA: Diagnosis not present

## 2020-11-23 DIAGNOSIS — R590 Localized enlarged lymph nodes: Secondary | ICD-10-CM | POA: Diagnosis not present

## 2020-11-23 DIAGNOSIS — R918 Other nonspecific abnormal finding of lung field: Secondary | ICD-10-CM | POA: Diagnosis not present

## 2020-11-23 DIAGNOSIS — D860 Sarcoidosis of lung: Secondary | ICD-10-CM | POA: Diagnosis not present

## 2020-11-23 DIAGNOSIS — I517 Cardiomegaly: Secondary | ICD-10-CM | POA: Diagnosis not present

## 2020-11-23 MED ORDER — OXYCODONE HCL 10 MG PO TABS: ORAL_TABLET | ORAL | 0 refills | Status: DC

## 2020-11-23 MED ORDER — CEPHALEXIN 500 MG PO CAPS: 500.0000 mg | ORAL_CAPSULE | Freq: Three times a day (TID) | ORAL | 0 refills | Status: DC

## 2020-11-23 MED ORDER — OXYCODONE HCL 10 MG PO TABS
ORAL_TABLET | ORAL | 0 refills | Status: DC
Start: 2020-11-23 — End: 2020-11-23

## 2020-11-23 MED FILL — predniSONE 5 MG TABS: 5 | 90 days supply | Qty: 180 | Fill #2

## 2020-11-23 MED FILL — HYDROXYZINE HCL 25 MG TABS: 25 | 20 days supply | Qty: 60 | Fill #0

## 2020-11-23 MED FILL — LEVOTHYROXINE 75 MCG TABLET: 75 | 90 days supply | Qty: 84 | Fill #0

## 2020-11-23 NOTE — Telephone Encounter (Signed)
Discussed with pt. Pt verbalized understanding. Med sent to pharm.  

## 2020-11-23 NOTE — Telephone Encounter (Signed)
Please advise. Thank you

## 2020-11-23 NOTE — Telephone Encounter (Signed)
Patient would like results of urine test. She states still having burning when she urinates.

## 2020-11-23 NOTE — Telephone Encounter (Signed)
Urine culture showed multiple organisms which is an indication that this is more than likely a contaminant urine. Given that she is having discomfort with urination I would recommend the following OTC Azo-Standard as needed Keflex 500 mg 1 3 times daily for 7 days Follow-up if ongoing

## 2020-11-25 MED FILL — oxyCODONE HCL 10 MG TABS: 10 | 30 days supply | Qty: 180 | Fill #0

## 2020-11-25 MED FILL — CEPHALEXIN 500 MG CAPSULE: 500 | 7 days supply | Qty: 21 | Fill #0 | Status: TO

## 2020-11-28 ENCOUNTER — Other Ambulatory Visit (HOSPITAL_COMMUNITY): Payer: Self-pay

## 2020-12-06 ENCOUNTER — Ambulatory Visit: Payer: 59 | Admitting: "Endocrinology

## 2020-12-07 ENCOUNTER — Ambulatory Visit: Payer: 59 | Admitting: "Endocrinology

## 2020-12-16 ENCOUNTER — Other Ambulatory Visit (HOSPITAL_COMMUNITY): Payer: Self-pay

## 2020-12-16 MED FILL — Hydroxyzine HCl Tab 25 MG: ORAL | 20 days supply | Qty: 60 | Fill #0 | Status: AC

## 2020-12-16 MED FILL — Pantoprazole Sodium EC Tab 40 MG (Base Equiv): ORAL | 90 days supply | Qty: 90 | Fill #0 | Status: CN

## 2020-12-17 DIAGNOSIS — J9601 Acute respiratory failure with hypoxia: Secondary | ICD-10-CM | POA: Diagnosis not present

## 2020-12-17 DIAGNOSIS — D860 Sarcoidosis of lung: Secondary | ICD-10-CM | POA: Diagnosis not present

## 2020-12-17 DIAGNOSIS — J849 Interstitial pulmonary disease, unspecified: Secondary | ICD-10-CM | POA: Diagnosis not present

## 2020-12-17 DIAGNOSIS — M6281 Muscle weakness (generalized): Secondary | ICD-10-CM | POA: Diagnosis not present

## 2020-12-20 DIAGNOSIS — M6281 Muscle weakness (generalized): Secondary | ICD-10-CM | POA: Diagnosis not present

## 2020-12-20 DIAGNOSIS — D860 Sarcoidosis of lung: Secondary | ICD-10-CM | POA: Diagnosis not present

## 2020-12-20 DIAGNOSIS — J9601 Acute respiratory failure with hypoxia: Secondary | ICD-10-CM | POA: Diagnosis not present

## 2020-12-20 DIAGNOSIS — J849 Interstitial pulmonary disease, unspecified: Secondary | ICD-10-CM | POA: Diagnosis not present

## 2020-12-26 ENCOUNTER — Other Ambulatory Visit (HOSPITAL_COMMUNITY): Payer: Self-pay

## 2020-12-26 MED FILL — Oxycodone HCl Tab 10 MG: ORAL | 30 days supply | Qty: 180 | Fill #0 | Status: AC

## 2021-01-09 ENCOUNTER — Other Ambulatory Visit (HOSPITAL_COMMUNITY): Payer: Self-pay

## 2021-01-09 ENCOUNTER — Other Ambulatory Visit: Payer: Self-pay | Admitting: "Endocrinology

## 2021-01-09 ENCOUNTER — Encounter: Payer: Self-pay | Admitting: Family Medicine

## 2021-01-09 ENCOUNTER — Other Ambulatory Visit: Payer: Self-pay

## 2021-01-09 ENCOUNTER — Other Ambulatory Visit: Payer: Self-pay | Admitting: Family Medicine

## 2021-01-09 ENCOUNTER — Ambulatory Visit (INDEPENDENT_AMBULATORY_CARE_PROVIDER_SITE_OTHER): Payer: 59 | Admitting: Family Medicine

## 2021-01-09 VITALS — BP 127/81 | HR 65 | Ht 70.0 in | Wt 240.0 lb

## 2021-01-09 DIAGNOSIS — R29898 Other symptoms and signs involving the musculoskeletal system: Secondary | ICD-10-CM | POA: Insufficient documentation

## 2021-01-09 DIAGNOSIS — L02419 Cutaneous abscess of limb, unspecified: Secondary | ICD-10-CM

## 2021-01-09 DIAGNOSIS — E2740 Unspecified adrenocortical insufficiency: Secondary | ICD-10-CM

## 2021-01-09 DIAGNOSIS — M21372 Foot drop, left foot: Secondary | ICD-10-CM

## 2021-01-09 MED ORDER — DOXYCYCLINE HYCLATE 100 MG PO TABS
100.0000 mg | ORAL_TABLET | Freq: Two times a day (BID) | ORAL | 0 refills | Status: DC
Start: 2021-01-09 — End: 2021-01-16

## 2021-01-09 MED ORDER — LEVOTHYROXINE SODIUM 75 MCG PO TABS
ORAL_TABLET | Freq: Every day | ORAL | 1 refills | Status: DC
  Filled 2021-01-09: qty 90, 90d supply, fill #0

## 2021-01-09 MED ORDER — HYDROCORTISONE 10 MG PO TABS
ORAL_TABLET | ORAL | 1 refills | Status: DC
  Filled 2021-01-09 – 2021-01-17 (×2): qty 270, 90d supply, fill #0

## 2021-01-09 MED ORDER — LINACLOTIDE 145 MCG PO CAPS
ORAL_CAPSULE | ORAL | 1 refills | Status: DC
  Filled 2021-01-09: qty 90, 90d supply, fill #0

## 2021-01-09 MED FILL — Pantoprazole Sodium EC Tab 40 MG (Base Equiv): ORAL | 90 days supply | Qty: 90 | Fill #0 | Status: AC

## 2021-01-09 MED FILL — Fluoxetine HCl Tab 20 MG: ORAL | 90 days supply | Qty: 90 | Fill #0 | Status: AC

## 2021-01-09 MED FILL — Hydroxyzine HCl Tab 25 MG: ORAL | 20 days supply | Qty: 60 | Fill #1 | Status: AC

## 2021-01-09 MED FILL — Midodrine HCl Tab 5 MG: ORAL | 30 days supply | Qty: 60 | Fill #0 | Status: AC

## 2021-01-09 NOTE — Patient Instructions (Signed)
It is possible this is caused by foot drop. Continue to use walker for safety to avoid falling.  For underarm abscess:  Use warm compresses for 20 minutes every 2 hours Put rice in sock and heat it and use as heat pad for 20 minutes every 2 hours (be careful not to burn skin) Avoid shaving under arms for now.      Common Peroneal Nerve Entrapment  Common peroneal nerve entrapment is a condition that can make it hard to lift a foot. The condition results from pressure on a nerve in the lower leg called the common peroneal nerve. Your common peroneal nerve provides feeling to your outer lower leg and foot. It also supplies the muscles that move your foot and toes upward and outward. What are the causes? This condition may be caused by:  Sitting cross-legged, squatting, or kneeling for long periods of time.  A hard, direct hit to the side of the lower leg.  Swelling from a knee injury.  A break (fracture) in one of the lower leg bones.  Wearing a boot or cast that ends just below the knee.  A growth or cyst near the nerve. What increases the risk? This condition is more likely to develop in people who play:  Contact sports, such as football or hockey.  Sports where you wear high and stiff boots, such as skiing. What are the signs or symptoms? Symptoms of this condition include:  Trouble lifting your foot up (foot drop).  Tripping often.  Your foot hitting the ground harder than normal as you walk.  Numbness, tingling, or pain in the outside of the knee, outside of the lower leg, and top of the foot.  Sensitivity to pressure on the front or side of the leg. How is this diagnosed? This condition may be diagnosed based on:  Your symptoms.  Your medical history.  A physical exam.  Tests, such as: ? An X-ray to check the bones of your knee and leg. ? MRI to check tendons that attach to the side of your knee. ? An ultrasound to check for a growth or cyst. ? An  electromyogram (EMG) to check your nerves. During your physical exam, your health care provider will check for numbness in your leg and test the strength of your lower leg muscles. He or she may tap the side of your lower leg to see if that causes tingling. How is this treated? Treatment for this condition may include:  Avoiding activities that make symptoms worse.  Using a brace to hold up your foot and toes.  Taking anti-inflammatory pain medicines to relieve swelling and lessen pain.  Having medicines injected into your ankle joint to lessen pain and swelling.  Doing exercises to help you regain or maintain movement (physical therapy).  Surgery to take pressure off the nerve. This may be needed if there is no improvement after 2-3 months or if there is a growth pushing on the nerve.  Returning gradually to full activity. Follow these instructions at home: If you have a brace:  Wear it as told by your health care provider. Remove it only as told by your health care provider.  Loosen the brace if your toes tingle, become numb, or turn cold and blue.  Keep the brace clean.  If the brace is not waterproof: ? Do not let it get wet. ? Cover it with a watertight covering when you take a bath or a shower.  Ask your health care provider  when it is safe to drive with a brace on your foot. Activity  Return to your normal activities as told by your health care provider. Ask your health care provider what activities are safe for you.  Do not do any activities that make pain or swelling worse.  Do exercises as told by your health care provider. General instructions  Take over-the-counter and prescription medicines only as told by your health care provider.  Do not put your full weight on your knee until your health care provider says you can. Use crutches as directed by your health care provider.  Keep all follow-up visits as told by your health care provider. This is  important. How is this prevented?  Wear supportive footwear that is appropriate for your athletic activity.  Avoid athletic activities that cause ankle pain or swelling.  Wear protective padding over your lower legs when playing contact sports.  Make sure your boots do not put extra pressure on the area just below your knees.  Do not sit cross-legged for long periods of time. Contact a health care provider if:  Your symptoms do not get better in 2-3 months.  The weakness or numbness in your leg or foot gets worse. Summary  Common peroneal nerve entrapment is a condition that results from pressure on a nerve in the lower leg called the common peroneal nerve.  This condition may be caused by a hard hit, swelling, a fracture, or a cyst in the lower leg.  Treatment may include rest, a brace, medicines, and physical therapy. Sometimes surgery is needed.  Do not do any activities that make pain or swelling worse. This information is not intended to replace advice given to you by your health care provider. Make sure you discuss any questions you have with your health care provider. Document Revised: 06/23/2018 Document Reviewed: 06/23/2018 Elsevier Patient Education  2021 [employer].

## 2021-01-09 NOTE — Progress Notes (Signed)
Patient ID: Kathleen [name], female    DOB: 1961-06-27, 60 y.o.   MRN: [id number]   Chief Complaint  Patient presents with  . tingling in left foot- goes completely numb    Affects her ability to walk Recurrent abscesses under both arms- very painful   Subjective:  Cc: numbness and tingling in left leg/ abscesses under arm   This is a new problem.  Presents today with a complaint of numbness and tingling in the left foot, describes as "electricity ".  Also has bilateral under the arm skin abscesses.  Numbness and tingling has been present for weeks, worse now is happening daily.  Having to use a walker for mobility, to avoid falling.  Reports that she has lots of issues with her left side, left hip avascular necrosis, and in February broke her left foot.  Skin abscesses, gets frequent abscesses, reports that Dr. Nicki Reaper Luking drains these abscesses and they resolved.  Denies fever, chills, shortness of breath.  Reports that she had episode of chest pain last night took a Tums and it resolved.  Endorses gait instability due to left foot numbness.    Medical History Gao has a past medical history of Adrenal insufficiency ([place]), Anemia, Avascular bone necrosis ([place]), Breast fibrocystic disorder, Chronic pain, Encounter for long-term opiate analgesic use (02/02/2020), Fibromyalgia, Gastroesophageal reflux disease, Mitral valve prolapse, Orthostatic hypotension, Peripheral neuropathy, Restrictive lung disease, Sarcoidosis, and SOB (shortness of breath).   Outpatient Encounter Medications as of 01/09/2021  Medication Sig  . doxycycline (VIBRA-TABS) 100 MG tablet Take 1 tablet (100 mg total) by mouth 2 (two) times daily.  Marland Kitchen acetaminophen (TYLENOL) 500 MG tablet Take 1,000 mg by mouth every 8 (eight) hours as needed for headache.  . cetirizine-pseudoephedrine (ZYRTEC-D) 5-120 MG tablet Take 1 tablet by mouth daily.  Marland Kitchen docusate sodium (COLACE) 100 MG capsule Take 100 mg by mouth 2 (two) times daily.   Marland Kitchen EPINEPHrine 0.3 mg/0.3 mL IJ SOAJ injection Inject 0.3 mLs (0.3 mg total) into the muscle as needed for anaphylaxis.  . ferrous sulfate 325 (65 FE) MG EC tablet Take 325 mg by mouth daily with breakfast.  . FLUoxetine (PROZAC) 20 MG tablet TAKE 1 TABLET BY MOUTH ONCE A DAY  . fluticasone (FLONASE) 50 MCG/ACT nasal spray Place 2 sprays into both nostrils daily.  . hydrocortisone (CORTEF) 10 MG tablet TAKE 2 TABLETS BY MOUTH DAILY AT 8 AM AND 1 TABLET DAILY AT NOON  . hydrOXYzine (ATARAX/VISTARIL) 25 MG tablet TAKE 1 TABLET BY MOUTH 3 TIMES DAILY AS NEEDED FOR ANXIETY  . levothyroxine (SYNTHROID) 75 MCG tablet TAKE 1 TABLET BY MOUTH ONCE DAILY BEFORE BREAKFAST  . levothyroxine (SYNTHROID) 75 MCG tablet TAKE 1 TABLET BY MOUTH BEFORE BREAKFAST EVERY DAY EXCEPT TAKE 1/2 TABLET 1 DAY A WEEK  . linaclotide (LINZESS) 145 MCG CAPS capsule TAKE 1 CAPSULE BY MOUTH ONCE A DAY BEFORE BREAKFAST  . methocarbamol (ROBAXIN) 500 MG tablet Take 1 tablet (500 mg total) by mouth 3 (three) times daily.  . midodrine (PROAMATINE) 5 MG tablet TAKE 1 TABLET BY MOUTH TWICE DAILY WITH A MEAL  . ondansetron (ZOFRAN-ODT) 8 MG disintegrating tablet TAKE 1 TABLET BY MOUTH EVERY 8 HOURS AS NEEDED FOR NAUSEA & VOMITING  . Oxycodone HCl 10 MG TABS TAKE 1 TABLET BY MOUTH EVERY 4 HOURS AS NEEDED FOR PAIN (MAX 6 TABLETS DAILY) **FILL 09/25/20**  . Oxycodone HCl 10 MG TABS TAKE 1 TABLET BY MOUTH EVERY 4 HOURS AS NEEDED FOR PAIN (  MAX 6 TABLETS DAILY) **FILL 10/24/20**  . Oxycodone HCl 10 MG TABS TAKE ONE TABLET BY MOUTH EVERY 4 HOURS AS NEEDED FOR PAIN. MAX 6 PER DAY. **MAY FILL 12/24/20  . Oxycodone HCl 10 MG TABS TAKE 1 TABLET BY MOUTH EVERY 4 HOURS AS NEEDED FOR PAIN. MAX 6 PER DAY. **MAY FILL 11/25/20.  Marland Kitchen Oxycodone HCl 10 MG TABS TAKE ONE TABLET BY MOUTH EVERY 4 HOURS AS NEEDED FOR PAIN. MAX 6 PER DAY. **MAY FILL 01/24/21  . pantoprazole (PROTONIX) 40 MG tablet TAKE 1 TABLET BY MOUTH ONCE DAILY  . predniSONE (DELTASONE) 10 MG tablet  Take 10 mg by mouth daily with breakfast.  . predniSONE (DELTASONE) 5 MG tablet TAKE 2 TABLETS BY MOUTH ONCE A DAY  . [DISCONTINUED] cephALEXin (KEFLEX) 500 MG capsule Take 1 capsule (500 mg total) by mouth 3 (three) times daily.   No facility-administered encounter medications on file as of 01/09/2021.     Review of Systems  Constitutional: Negative for chills and fever.  Respiratory: Negative for shortness of breath.   Cardiovascular: Positive for chest pain (had pain last night, took tums and resolved).  Gastrointestinal: Negative for abdominal pain.  Genitourinary: Negative for dysuria.  Musculoskeletal: Positive for gait problem (left leg numbness and tingling).  Neurological: Positive for weakness (left leg) and numbness.     Vitals BP 127/81   Pulse 65   Ht 5\' 10"  (1.778 m)   Wt 240 lb (108.9 kg)   LMP 05/17/2016   SpO2 96%   BMI 34.44 kg/m   Objective:   Physical Exam Vitals reviewed.  Constitutional:      Appearance: Normal appearance.  Cardiovascular:     Rate and Rhythm: Normal rate and regular rhythm.     Heart sounds: Normal heart sounds.  Pulmonary:     Effort: Pulmonary effort is normal.     Breath sounds: Normal breath sounds.  Musculoskeletal:     Left foot: Foot drop present.     Comments: Weakness to left foot.  Reports "electricity "in left foot.  Skin:    General: Skin is warm and dry.          Comments: Raised areas noted under each arm, tender to touch, nonerythematous.  Neurological:     General: No focal deficit present.     Mental Status: She is alert.  Psychiatric:        Behavior: Behavior normal.      Assessment and Plan   1. Left foot drop - Nerve conduction test - Ambulatory referral to Physical Therapy  2. Cutaneous abscess of axilla, unspecified laterality - doxycycline (VIBRA-TABS) 100 MG tablet; Take 1 tablet (100 mg total) by mouth 2 (two) times daily.  Dispense: 20 tablet; Refill: 0   Consulted with Dr. Sallee Lange while patient in the office.  He assessed Kathleen [name] today.  Plan of care includes nerve conduction study for left lower extremity, and ambulatory referral to physical therapy.  Will start doxycycline twice per day for 10 days for axillary abscesses.  She is instructed to use warm compresses frequently, for 20 minutes every 2 hours.  She is also instructed to put rice in a sock, heated in the microwave and apply to areas 20 minutes every 2 hours avoiding burning the skin.  She will take doxycycline for 10 days, and avoid shaving under her arms.  Agrees with plan of care discussed today. Understands warning signs to seek further care: chest pain, shortness of breath, any significant  change in health.  Understands to follow-up in one week with PCP, Dr. Wolfgang Phoenix for reassess of issues.    Pecolia Ades, NP 01/09/21

## 2021-01-10 ENCOUNTER — Other Ambulatory Visit (HOSPITAL_COMMUNITY): Payer: Self-pay

## 2021-01-16 ENCOUNTER — Encounter: Payer: Self-pay | Admitting: Family Medicine

## 2021-01-16 ENCOUNTER — Ambulatory Visit (HOSPITAL_COMMUNITY)
Admission: RE | Admit: 2021-01-16 | Discharge: 2021-01-16 | Disposition: A | Discharge status: Home / Self Care | Payer: 59 | Source: Ambulatory Visit | Attending: Family Medicine | Admitting: Family Medicine

## 2021-01-16 ENCOUNTER — Ambulatory Visit (INDEPENDENT_AMBULATORY_CARE_PROVIDER_SITE_OTHER): Payer: 59 | Admitting: Family Medicine

## 2021-01-16 ENCOUNTER — Other Ambulatory Visit: Payer: Self-pay

## 2021-01-16 VITALS — BP 132/74 | HR 74 | Temp 97.1°F | Ht 70.0 in

## 2021-01-16 DIAGNOSIS — R29898 Other symptoms and signs involving the musculoskeletal system: Secondary | ICD-10-CM | POA: Insufficient documentation

## 2021-01-16 DIAGNOSIS — L739 Follicular disorder, unspecified: Secondary | ICD-10-CM

## 2021-01-16 DIAGNOSIS — M5432 Sciatica, left side: Secondary | ICD-10-CM | POA: Diagnosis not present

## 2021-01-16 DIAGNOSIS — R531 Weakness: Secondary | ICD-10-CM | POA: Diagnosis not present

## 2021-01-16 DIAGNOSIS — G959 Disease of spinal cord, unspecified: Secondary | ICD-10-CM

## 2021-01-16 DIAGNOSIS — M6281 Muscle weakness (generalized): Secondary | ICD-10-CM | POA: Diagnosis not present

## 2021-01-16 DIAGNOSIS — Z8669 Personal history of other diseases of the nervous system and sense organs: Secondary | ICD-10-CM | POA: Diagnosis not present

## 2021-01-16 DIAGNOSIS — J9601 Acute respiratory failure with hypoxia: Secondary | ICD-10-CM | POA: Diagnosis not present

## 2021-01-16 DIAGNOSIS — D860 Sarcoidosis of lung: Secondary | ICD-10-CM | POA: Diagnosis not present

## 2021-01-16 DIAGNOSIS — M5126 Other intervertebral disc displacement, lumbar region: Secondary | ICD-10-CM | POA: Diagnosis not present

## 2021-01-16 DIAGNOSIS — R258 Other abnormal involuntary movements: Secondary | ICD-10-CM | POA: Diagnosis not present

## 2021-01-16 DIAGNOSIS — J849 Interstitial pulmonary disease, unspecified: Secondary | ICD-10-CM | POA: Diagnosis not present

## 2021-01-16 DIAGNOSIS — R200 Anesthesia of skin: Secondary | ICD-10-CM

## 2021-01-16 DIAGNOSIS — M4802 Spinal stenosis, cervical region: Secondary | ICD-10-CM | POA: Diagnosis not present

## 2021-01-16 MED ORDER — DOXYCYCLINE HYCLATE 100 MG PO TABS: ORAL_TABLET | ORAL | 0 refills | Status: DC

## 2021-01-16 NOTE — Progress Notes (Signed)
   Subjective:    Patient ID: Kathleen [name], female    DOB: June 03, 1961, 60 y.o.   MRN: [id number]  HPIrecheck abscess under both arms. Finished doxy.    States she is not able to get to PT and would like them to come to her home. Hard for her to carry around her oxygen tank.   Patient relates over the past 3 weeks increased weakness difficult time getting around her legs are going weak she relates falling even with a walker.  In addition to this she states the grip in her strength is going down.  She is noticed that her legs shake as well as her arm shake she does have some increased weakness on the left side compared to the right side and also some intermittent numbness  She does have history of avascular necrosis of the hip  Review of Systems     Objective:   Physical Exam Patient does have clonus of both her arms and legs which is concerning.  Patient also has weakness which is very difficult to get to the bottom of this because of patient's increased symptomatology today but as best I can tell there is weakness in both grips as well as in her legs The folliculitis under the arm seem to be doing a little bit better Lungs clear heart regular       Assessment & Plan:  1. Clonus Mild clonus is noted in the arms and in the legs this is concerning for myelopathy of the cervical spine MRI is ordered.  2. Cervical myelopathy (HCC) Weakness in arms and legs along with clonus difficult time walking.  Recommend the patient avoid driving after today.  MRI cervical spine recommended. - Ambulatory referral to Physical Therapy - MR Cervical Spine Wo Contrast - MR Lumbar Spine Wo Contrast  3. Sciatica, left side Severe sciatica been going on for months worse now recommend MRI lumbar spine especially with the weakness in her legs although that could be due to cervical myelopathy - Ambulatory referral to Physical Therapy - MR Cervical Spine Wo Contrast - MR Lumbar Spine Wo Contrast  4.  Weakness of left lower extremity Please see above physical therapy would be beneficial patient prefers home physical therapy because she will not be able to drive and will be homebound - Ambulatory referral to Physical Therapy - MR Cervical Spine Wo Contrast - MR Lumbar Spine Wo Contrast  5. Decreased grip strength Please see above - Ambulatory referral to Physical Therapy - MR Cervical Spine Wo Contrast - MR Lumbar Spine Wo Contrast  6. Arm numbness See above - Ambulatory referral to Physical Therapy - MR Cervical Spine Wo Contrast - MR Lumbar Spine Wo Contrast  7. Folliculitis Doxycycline for 7 days follow-up if problems Recheck several weeks

## 2021-01-17 ENCOUNTER — Ambulatory Visit (HOSPITAL_COMMUNITY): Payer: 59

## 2021-01-17 ENCOUNTER — Other Ambulatory Visit (HOSPITAL_COMMUNITY): Payer: Self-pay

## 2021-01-17 ENCOUNTER — Other Ambulatory Visit: Payer: Self-pay | Admitting: *Deleted

## 2021-01-17 ENCOUNTER — Telehealth: Payer: Self-pay | Admitting: *Deleted

## 2021-01-17 DIAGNOSIS — R258 Other abnormal involuntary movements: Secondary | ICD-10-CM

## 2021-01-17 DIAGNOSIS — M6281 Muscle weakness (generalized): Secondary | ICD-10-CM

## 2021-01-17 NOTE — Telephone Encounter (Signed)
Neurosurgery has already looked at her images and states that she is not a surgical patient at this time They recommend neurology That is [facility] or [facility] for further evaluation (Dr. Trenton Gammon and Dr. Ellene Route are neurosurgeons)

## 2021-01-17 NOTE — Telephone Encounter (Signed)
Pt asking for a motorized scooter that is light weight and she can get through small doorways. One that she can handle by herself to get in and out of the car by herself.

## 2021-01-17 NOTE — Telephone Encounter (Signed)
Pt called back and said she looked up dr pool and he only had 3.5 stars and she wants to go someone in [facility]. I canceled referral with dr pool and is it ok to put in referral for dr [facility] for pt.

## 2021-01-17 NOTE — Telephone Encounter (Signed)
1.  I have no idea where she could get this More than likely it is not covered directly by insurance If it is a motorized scooter perhaps [place] could help guide her where to go If it is more of a ambulatory wheelchair apothecary would have these with a prescription But the patient may need to check with her insurance to see if they have a preferred provider for durable medical equipment  I am not sure that she would qualify for a scooter under her insurance without having a physical therapy evaluation to see if she qualifies for a scooter  There are numerous rules or regulations with insurance companies about this type of equipment that goes beyond my scope and hopefully the patient can work with [employer] as well as physical therapy to see what she would qualify for

## 2021-01-17 NOTE — Telephone Encounter (Signed)
Pt called back and said she looked up wrong neurologist and dr pool was good if he was [facility]. If not she did want someone [facility]

## 2021-01-18 ENCOUNTER — Other Ambulatory Visit: Payer: Self-pay | Admitting: *Deleted

## 2021-01-18 DIAGNOSIS — R258 Other abnormal involuntary movements: Secondary | ICD-10-CM

## 2021-01-18 DIAGNOSIS — M6281 Muscle weakness (generalized): Secondary | ICD-10-CM

## 2021-01-18 NOTE — Telephone Encounter (Signed)
Discussed with pt. Pt verbalized understanding.  °

## 2021-01-18 NOTE — Telephone Encounter (Signed)
Discussed with pt and referral to neurology put in

## 2021-01-19 DIAGNOSIS — M6281 Muscle weakness (generalized): Secondary | ICD-10-CM | POA: Diagnosis not present

## 2021-01-19 DIAGNOSIS — J849 Interstitial pulmonary disease, unspecified: Secondary | ICD-10-CM | POA: Diagnosis not present

## 2021-01-19 DIAGNOSIS — D860 Sarcoidosis of lung: Secondary | ICD-10-CM | POA: Diagnosis not present

## 2021-01-19 DIAGNOSIS — J9601 Acute respiratory failure with hypoxia: Secondary | ICD-10-CM | POA: Diagnosis not present

## 2021-01-20 ENCOUNTER — Encounter: Payer: Self-pay | Admitting: Neurology

## 2021-01-20 ENCOUNTER — Other Ambulatory Visit: Payer: Self-pay | Admitting: *Deleted

## 2021-01-20 DIAGNOSIS — M21379 Foot drop, unspecified foot: Secondary | ICD-10-CM

## 2021-01-20 DIAGNOSIS — G709 Myoneural disorder, unspecified: Secondary | ICD-10-CM

## 2021-01-25 ENCOUNTER — Other Ambulatory Visit (HOSPITAL_COMMUNITY): Payer: Self-pay

## 2021-01-25 ENCOUNTER — Telehealth: Payer: Self-pay | Admitting: Neurology

## 2021-01-25 MED FILL — Oxycodone HCl Tab 10 MG: ORAL | 30 days supply | Qty: 180 | Fill #0 | Status: AC

## 2021-01-25 NOTE — Telephone Encounter (Signed)
Patient was offered appt with Sharene Butters and Dr Tomi Likens sooner than July . Patient states that she wanted to see a Dr and not a PA. She did not want to see a different provider she wanted to see Dr Posey Pronto. I have added her to the wait list and we will give her a call

## 2021-01-30 ENCOUNTER — Ambulatory Visit (INDEPENDENT_AMBULATORY_CARE_PROVIDER_SITE_OTHER): Payer: 59 | Admitting: Neurology

## 2021-01-30 ENCOUNTER — Encounter: Payer: Self-pay | Admitting: Neurology

## 2021-01-30 ENCOUNTER — Encounter (HOSPITAL_COMMUNITY): Payer: Self-pay

## 2021-01-30 ENCOUNTER — Other Ambulatory Visit: Payer: Self-pay

## 2021-01-30 ENCOUNTER — Ambulatory Visit (HOSPITAL_COMMUNITY): Payer: 59 | Attending: Family Medicine | Admitting: Physical Therapy

## 2021-01-30 VITALS — BP 122/72 | HR 74 | Ht 71.0 in

## 2021-01-30 DIAGNOSIS — M6281 Muscle weakness (generalized): Secondary | ICD-10-CM | POA: Insufficient documentation

## 2021-01-30 DIAGNOSIS — R202 Paresthesia of skin: Secondary | ICD-10-CM

## 2021-01-30 DIAGNOSIS — G8929 Other chronic pain: Secondary | ICD-10-CM | POA: Insufficient documentation

## 2021-01-30 DIAGNOSIS — M47812 Spondylosis without myelopathy or radiculopathy, cervical region: Secondary | ICD-10-CM

## 2021-01-30 DIAGNOSIS — M5442 Lumbago with sciatica, left side: Secondary | ICD-10-CM | POA: Insufficient documentation

## 2021-01-30 NOTE — Patient Instructions (Addendum)
We will refer you to [facility]   Start physical therapy

## 2021-01-30 NOTE — Progress Notes (Signed)
[facility] Clinic Note - Initial Visit   Date: 01/30/21  [name] MRN: [id number] DOB: Apr 10, 1961   Dear Dr. Wolfgang Phoenix:  Thank you for your kind referral of Kathleen [name] for consultation of leg weakness. Although her history is well known to you, please allow [place] to reiterate it for the purpose of our medical record. The patient was accompanied to the clinic by self.    History of Present Illness: Kathleen [name] is a 60 y.o. right-handed female with pulmonary sarcoidosis, hypothyroidism, GERD, and orthostatic hypotension presenting for evaluation of generalized tingling and weakness. Starting around summer 2021, she began having tingling of the hands and feet which has progressively got worse over the past year.  Symptoms are intermittent and can involve the feet and hands separately. No specific triggers.  More recently, she was at the beach with her husband in mid-[place] and was unable to stand up because her left foot was numbness/tingling.  Eventually, the numbness subsided and she was able to walk about.  She has been using a walker for past few weeks.   She endorses mild low back pain, no neck pain.  She denies cramps or radicular pain. She is dropping things from her right hand.  She fell on Valentine's Day and broke her left foot.   She saw her PCP's office for these symptoms who ordered MRI cervical spine and lumbar spine.  Imaging of the cervical spine shows congenitally narrow spinal canal, no stenosis.  MRI lumbar spine with mild degenerative changes, no stenosis. Review of her chart shows that she was referred here for EMG in 2018.  We were only able to study two sensory nerves, which were normal, before patient requested to stop the test due to pain. In 2014, she was evaluated by Dr. Jannifer Franklin at [facility] and Dr Anna Genre at [facility] for paresthesias of the arms and legs.  MRI brain and CSF testing was normal, specifically no evidence of  neurosarcoidosis.  She is been on disability since 2014 for pulmonary sarcoidosis.  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  She lives at home with her husband in a one-level home.    Out-side paper records, electronic medical record, and images have been reviewed where available and summarized as:  NCS/EMG of the legs 08/09/2017: There is no evidence of a sensory polyneuropathy in the right lower extremity. No other meaningful conclusions can be made from the study, as it was prematurely terminated due to pain.  MRI lumbar spine wo contrast 01/16/2021: Transitional L5 vertebra with well-formed intervertebral disc space, and left-sided assimilation joint, at L5-S1 Mild lumbar spondylosis, as outlined and greatest at L3-L4 and L4-L5. No significant spinal canal stenosis or neural foraminal narrowing. Subtle degenerative edema within the midline posterior elements at L5.  MRI cervical spine wo contrast 01/16/2021: Mild to moderately motion degraded examination, limiting evaluation.  Cervical spondylosis superimposed upon a congenitally narrow cervical spinal canal, as outlined. No more than mild degenerative spinal canal stenosis. Moderate bilateral neural foraminal narrowing at C5-C6. Additional sites of mild neural foraminal narrowing, as detailed.  Lab Results  Component Value Date   HGBA1C 5.4 02/03/2019   Lab Results  Component Value Date   RXVQMGQQ76 195 11/26/2012   Lab Results  Component Value Date   TSH 0.307 (L) 11/09/2020   Lab Results  Component Value Date   ESRSEDRATE 13 02/11/2013    Past Medical History:  Diagnosis Date  . Adrenal insufficiency ([place])   .  Anemia   . Avascular bone necrosis ([place])   . Breast fibrocystic disorder   . Chronic pain   . Encounter for long-term opiate analgesic use 02/02/2020   Patient has chronic pain with femoral head avascular necrosis.  Is under pain contract  . Fibromyalgia   . Gastroesophageal reflux disease   . Mitral  valve prolapse   . Orthostatic hypotension   . Peripheral neuropathy   . Restrictive lung disease    Moderate to severe  . Sarcoidosis    Biopsy proven - [facility]  . SOB (shortness of breath)    chronic    Past Surgical History:  Procedure Laterality Date  . BIOPSY  10/17/2018   Procedure: BIOPSY;  Surgeon: Rogene Houston, MD;  [facility];  Service: Endoscopy;;  duodenum, antrum, gastric body  . BREAST LUMPECTOMY  01/12/2011   right  . ESOPHAGOGASTRODUODENOSCOPY (EGD) WITH PROPOFOL N/A 10/17/2018   Procedure: ESOPHAGOGASTRODUODENOSCOPY (EGD) WITH PROPOFOL;  Surgeon: Rogene Houston, MD;  Location: [facility];  Service: Endoscopy;  Laterality: N/A;  2:25  . TUBAL LIGATION       Medications:  Outpatient Encounter Medications as of 01/30/2021  Medication Sig  . acetaminophen (TYLENOL) 500 MG tablet Take 1,000 mg by mouth every 8 (eight) hours as needed for headache.  . docusate sodium (COLACE) 100 MG capsule Take 100 mg by mouth 2 (two) times daily.  Marland Kitchen EPINEPHrine 0.3 mg/0.3 mL IJ SOAJ injection Inject 0.3 mLs (0.3 mg total) into the muscle as needed for anaphylaxis.  . ferrous sulfate 325 (65 FE) MG EC tablet Take 325 mg by mouth daily with breakfast.  . FLUoxetine (PROZAC) 20 MG tablet TAKE 1 TABLET BY MOUTH ONCE A DAY  . fluticasone (FLONASE) 50 MCG/ACT nasal spray Place 2 sprays into both nostrils daily.  . hydrocortisone (CORTEF) 10 MG tablet TAKE 2 TABLETS BY MOUTH DAILY AT 8 AM AND 1 TABLET DAILY AT NOON  . hydrOXYzine (ATARAX/VISTARIL) 25 MG tablet TAKE 1 TABLET BY MOUTH 3 TIMES DAILY AS NEEDED FOR ANXIETY  . levothyroxine (SYNTHROID) 75 MCG tablet TAKE 1 TABLET BY MOUTH BEFORE BREAKFAST EVERY DAY EXCEPT TAKE 1/2 TABLET 1 DAY A WEEK  . linaclotide (LINZESS) 145 MCG CAPS capsule TAKE 1 CAPSULE BY MOUTH ONCE A DAY BEFORE BREAKFAST  . methocarbamol (ROBAXIN) 500 MG tablet Take 1 tablet (500 mg total) by mouth 3 (three) times daily.  . midodrine (PROAMATINE) 5 MG tablet  TAKE 1 TABLET BY MOUTH TWICE DAILY WITH A MEAL  . ondansetron (ZOFRAN-ODT) 8 MG disintegrating tablet TAKE 1 TABLET BY MOUTH EVERY 8 HOURS AS NEEDED FOR NAUSEA & VOMITING  . Oxycodone HCl 10 MG TABS TAKE 1 TABLET BY MOUTH EVERY 4 HOURS AS NEEDED FOR PAIN (MAX 6 TABLETS DAILY) **FILL 09/25/20**  . pantoprazole (PROTONIX) 40 MG tablet TAKE 1 TABLET BY MOUTH ONCE DAILY  . predniSONE (DELTASONE) 5 MG tablet TAKE 2 TABLETS BY MOUTH ONCE A DAY  . [DISCONTINUED] Oxycodone HCl 10 MG TABS TAKE 1 TABLET BY MOUTH EVERY 4 HOURS AS NEEDED FOR PAIN (MAX 6 TABLETS DAILY) **FILL 10/24/20**  . [DISCONTINUED] doxycycline (VIBRA-TABS) 100 MG tablet Take one tablet po BID for 7 days  . [DISCONTINUED] levothyroxine (SYNTHROID) 75 MCG tablet TAKE 1 TABLET BY MOUTH ONCE DAILY BEFORE BREAKFAST (Patient not taking: Reported on 01/16/2021)  . [DISCONTINUED] Oxycodone HCl 10 MG TABS TAKE ONE TABLET BY MOUTH EVERY 4 HOURS AS NEEDED FOR PAIN. MAX 6 PER DAY. **MAY FILL 12/24/20  . [  DISCONTINUED] Oxycodone HCl 10 MG TABS TAKE 1 TABLET BY MOUTH EVERY 4 HOURS AS NEEDED FOR PAIN. MAX 6 PER DAY. **MAY FILL 11/25/20.  . [DISCONTINUED] Oxycodone HCl 10 MG TABS TAKE ONE TABLET BY MOUTH EVERY 4 HOURS AS NEEDED FOR PAIN. MAX 6 PER DAY. **MAY FILL 01/24/21   No facility-administered encounter medications on file as of 01/30/2021.    Allergies:  Allergies  Allergen Reactions  . Bee Venom Anaphylaxis  . Bactrim [Sulfamethoxazole-Trimethoprim] Nausea And Vomiting  . Codeine Itching  . Contrast Media [Iodinated Diagnostic Agents]     CT chest w/ contrast at OSH followed by SOB resolved w/ single dose steroids, never ENT swelling, intubation or pressors, has had multiple contrasted scans since  . Morphine And Related Other (See Comments)    Unknown reaction- pt gets sick, dizzy, and confused Intolerance, not an allergy, received this in the emergency department recently without a problem.    Family History: Family History  Problem Relation  Age of Onset  . Cardiomyopathy Mother   . Breast cancer Mother   . Prostate cancer Father   . Neurofibromatosis Brother   . Heart disease Maternal Aunt   . Bipolar disorder Daughter   . Colon cancer Neg Hx     Social History: Social History   Tobacco Use  . Smoking status: Never Smoker  . Smokeless tobacco: Never Used  Vaping Use  . Vaping Use: Never used  Substance Use Topics  . Alcohol use: No  . Drug use: No   Social History   Social History Narrative   Daily caffeine    Living with husband   Right handed   She has been on disability since 2014.     Vital Signs:  BP 122/72   Pulse 74   Ht 5\' 11"  (1.803 m)   LMP 05/17/2016   SpO2 95%   BMI 33.47 kg/m    Neurological Exam: MENTAL STATUS including orientation to time, place, person, recent and remote memory, attention span and concentration, language, and fund of knowledge is normal.  Speech is not dysarthric.  CRANIAL NERVES: II:  No visual field defects.   III-IV-VI: Pupils equal round and reactive to light.  Normal conjugate, extra-ocular eye movements in all directions of gaze.  No nystagmus.  No ptosis.   V:  Normal facial sensation.    VII:  Normal facial symmetry and movements.   VIII:  Normal hearing and vestibular function.   IX-X:  Normal palatal movement.   XI:  Normal shoulder shrug and head rotation.   XII:  Normal tongue strength and range of motion, no deviation or fasciculation.  MOTOR:  Nonphysiological exam with diffuse give-way weakness throughout.  With much encouragement and repeated testing, I was able to appreciate 5/5 motor strength in the upper extremities (proximal and distal) and 5/5 distally in the feet.  She continues to have give-way weakness with proximal leg testing. There is no focal atrophy, fasciculations.  Her arms become tremulous when asked to give better strength.  Nonphysiological tremor which dissipates with distraction and at rest.  No pronator drift.   MSRs:  Right         Left                  brachioradialis 2+  2+  biceps 2+  2+  triceps 2+  2+  patellar 2+  2+  ankle jerk 2+  2+  Hoffman no  no  plantar response down  down  No clonus.  SENSORY:  Normal and symmetric perception of light touch, pinprick, vibration  COORDINATION/GAIT: Normal finger-to- nose-finger.  Intact rapid alternating movements bilaterally.  Unable to rise from a chair without using arms.  Patient arrived in wheelchair and told my medical assistant that she was unable to stand to get her weight.  On my exam, patient able to stand by pushing off with arms and able to walk unassisted.  Overall, gait appeared stable without ataxia.   IMPRESSION: Generalized weakness and paresthesias of the arms and legs, subjective. Symptoms are longstanding by review of medical chart where she has been evaluated for this at [facility] and [facility] in 2014.  Patient tells me symptoms started last year and are getting worse.    There is functional component to her exam as motor strength testing is inconsistent with give-way weakness.  From what I can obtain, reflexes and sensation is normal making neuropathy unlikely.  Prior NCS in 2018 showed normal right sural and superficial peroneal sensory responses, testing was stopped due to pain.  I offered repeat NCS/EMG to investigate her paresthesias, which she declined.  There are no features on her exam to suggest intracranial pathology.   I have personally viewed her MRI cervical spine which is somewhat limited my motion artifact.  There is some narrowing of the spinal canal, without obvious cord impingement. I do not see compressive pathology, but given the lack of answers for this patient, I will seek the opinion of neurosurgery, if they have any further opinion regarding her cervical spine which shows congenitally narrow cervical canal.  I explained to patient that I do not have a good answer for her symptoms.  Electrodiagnostic testing would be helpful, which she  declines.  In the meantime, I suggest that she start physical therapy for leg strengthening and gait training.    Thank you for allowing me to participate in patient's care.  If I can answer any additional questions, I would be pleased to do so.    Sincerely,    Lizzeth Meder K. Posey Pronto, DO

## 2021-02-02 ENCOUNTER — Other Ambulatory Visit: Payer: Self-pay | Admitting: Family Medicine

## 2021-02-02 ENCOUNTER — Other Ambulatory Visit: Payer: Self-pay

## 2021-02-02 ENCOUNTER — Telehealth: Payer: Self-pay | Admitting: Neurology

## 2021-02-02 ENCOUNTER — Other Ambulatory Visit (HOSPITAL_COMMUNITY): Payer: Self-pay

## 2021-02-02 DIAGNOSIS — R202 Paresthesia of skin: Secondary | ICD-10-CM

## 2021-02-02 DIAGNOSIS — M47812 Spondylosis without myelopathy or radiculopathy, cervical region: Secondary | ICD-10-CM

## 2021-02-02 MED ORDER — LEVOTHYROXINE SODIUM 75 MCG PO TABS
ORAL_TABLET | ORAL | 0 refills | Status: DC
  Filled 2021-02-02: qty 28, 30d supply, fill #0

## 2021-02-02 MED ORDER — HYDROXYZINE HCL 25 MG PO TABS
ORAL_TABLET | Freq: Three times a day (TID) | ORAL | 0 refills | Status: DC | PRN
  Filled 2021-02-02: qty 60, 20d supply, fill #0

## 2021-02-02 NOTE — Telephone Encounter (Signed)
Patient called and said her referral has not yet been received by [facility].   She is requesting a call back once it has been sent.

## 2021-02-02 NOTE — Telephone Encounter (Signed)
Pt called and informed that referral was sent

## 2021-02-03 ENCOUNTER — Other Ambulatory Visit (HOSPITAL_COMMUNITY): Payer: Self-pay

## 2021-02-06 DIAGNOSIS — R0602 Shortness of breath: Secondary | ICD-10-CM | POA: Diagnosis not present

## 2021-02-06 DIAGNOSIS — J849 Interstitial pulmonary disease, unspecified: Secondary | ICD-10-CM | POA: Diagnosis not present

## 2021-02-06 DIAGNOSIS — D860 Sarcoidosis of lung: Secondary | ICD-10-CM | POA: Diagnosis not present

## 2021-02-07 ENCOUNTER — Other Ambulatory Visit (HOSPITAL_COMMUNITY): Payer: Self-pay

## 2021-02-07 ENCOUNTER — Telehealth: Payer: Self-pay | Admitting: Family Medicine

## 2021-02-07 DIAGNOSIS — R29898 Other symptoms and signs involving the musculoskeletal system: Secondary | ICD-10-CM

## 2021-02-07 DIAGNOSIS — M549 Dorsalgia, unspecified: Secondary | ICD-10-CM

## 2021-02-07 NOTE — Telephone Encounter (Signed)
Patient is requesting another referral for physical therapy. She states hers had expired.please advise

## 2021-02-07 NOTE — Telephone Encounter (Signed)
Please go ahead and do so for back pain and leg weakness

## 2021-02-07 NOTE — Telephone Encounter (Signed)
PT referral placed and pt is aware

## 2021-02-10 ENCOUNTER — Telehealth (HOSPITAL_COMMUNITY): Payer: Self-pay | Admitting: Physical Therapy

## 2021-02-10 NOTE — Telephone Encounter (Signed)
Pt called looking for referral -pt will call Dr. Sallee Lange office and ask them to send our office the referral again

## 2021-02-14 ENCOUNTER — Other Ambulatory Visit (HOSPITAL_COMMUNITY): Payer: Self-pay

## 2021-02-14 DIAGNOSIS — M797 Fibromyalgia: Secondary | ICD-10-CM | POA: Diagnosis not present

## 2021-02-14 DIAGNOSIS — R262 Difficulty in walking, not elsewhere classified: Secondary | ICD-10-CM | POA: Diagnosis not present

## 2021-02-14 DIAGNOSIS — R197 Diarrhea, unspecified: Secondary | ICD-10-CM | POA: Diagnosis not present

## 2021-02-14 DIAGNOSIS — M549 Dorsalgia, unspecified: Secondary | ICD-10-CM | POA: Diagnosis not present

## 2021-02-14 DIAGNOSIS — R202 Paresthesia of skin: Secondary | ICD-10-CM | POA: Diagnosis not present

## 2021-02-14 DIAGNOSIS — R627 Adult failure to thrive: Secondary | ICD-10-CM | POA: Diagnosis not present

## 2021-02-14 DIAGNOSIS — G8929 Other chronic pain: Secondary | ICD-10-CM | POA: Diagnosis not present

## 2021-02-14 DIAGNOSIS — M79643 Pain in unspecified hand: Secondary | ICD-10-CM | POA: Diagnosis not present

## 2021-02-14 DIAGNOSIS — Z20822 Contact with and (suspected) exposure to covid-19: Secondary | ICD-10-CM | POA: Diagnosis not present

## 2021-02-14 DIAGNOSIS — M545 Low back pain, unspecified: Secondary | ICD-10-CM | POA: Diagnosis not present

## 2021-02-14 DIAGNOSIS — M79672 Pain in left foot: Secondary | ICD-10-CM | POA: Diagnosis not present

## 2021-02-14 DIAGNOSIS — D869 Sarcoidosis, unspecified: Secondary | ICD-10-CM | POA: Diagnosis not present

## 2021-02-14 DIAGNOSIS — D649 Anemia, unspecified: Secondary | ICD-10-CM | POA: Diagnosis not present

## 2021-02-14 DIAGNOSIS — K219 Gastro-esophageal reflux disease without esophagitis: Secondary | ICD-10-CM | POA: Diagnosis not present

## 2021-02-14 DIAGNOSIS — M4802 Spinal stenosis, cervical region: Secondary | ICD-10-CM | POA: Diagnosis not present

## 2021-02-14 DIAGNOSIS — R531 Weakness: Secondary | ICD-10-CM | POA: Diagnosis not present

## 2021-02-14 DIAGNOSIS — Z885 Allergy status to narcotic agent status: Secondary | ICD-10-CM | POA: Diagnosis not present

## 2021-02-14 DIAGNOSIS — E039 Hypothyroidism, unspecified: Secondary | ICD-10-CM | POA: Diagnosis not present

## 2021-02-14 DIAGNOSIS — E2740 Unspecified adrenocortical insufficiency: Secondary | ICD-10-CM | POA: Diagnosis not present

## 2021-02-14 DIAGNOSIS — N882 Stricture and stenosis of cervix uteri: Secondary | ICD-10-CM | POA: Diagnosis not present

## 2021-02-14 DIAGNOSIS — M79671 Pain in right foot: Secondary | ICD-10-CM | POA: Diagnosis not present

## 2021-02-14 DIAGNOSIS — R5383 Other fatigue: Secondary | ICD-10-CM | POA: Diagnosis not present

## 2021-02-14 DIAGNOSIS — Z6835 Body mass index (BMI) 35.0-35.9, adult: Secondary | ICD-10-CM | POA: Diagnosis not present

## 2021-02-14 DIAGNOSIS — Z7951 Long term (current) use of inhaled steroids: Secondary | ICD-10-CM | POA: Diagnosis not present

## 2021-02-14 DIAGNOSIS — F419 Anxiety disorder, unspecified: Secondary | ICD-10-CM | POA: Diagnosis not present

## 2021-02-14 DIAGNOSIS — E2749 Other adrenocortical insufficiency: Secondary | ICD-10-CM | POA: Diagnosis not present

## 2021-02-15 ENCOUNTER — Other Ambulatory Visit (HOSPITAL_COMMUNITY): Payer: Self-pay

## 2021-02-15 DIAGNOSIS — N882 Stricture and stenosis of cervix uteri: Secondary | ICD-10-CM | POA: Diagnosis not present

## 2021-02-15 DIAGNOSIS — Z6835 Body mass index (BMI) 35.0-35.9, adult: Secondary | ICD-10-CM | POA: Diagnosis not present

## 2021-02-15 DIAGNOSIS — Z7951 Long term (current) use of inhaled steroids: Secondary | ICD-10-CM | POA: Diagnosis not present

## 2021-02-15 DIAGNOSIS — D860 Sarcoidosis of lung: Secondary | ICD-10-CM | POA: Diagnosis not present

## 2021-02-15 DIAGNOSIS — G609 Hereditary and idiopathic neuropathy, unspecified: Secondary | ICD-10-CM | POA: Diagnosis not present

## 2021-02-15 DIAGNOSIS — R531 Weakness: Secondary | ICD-10-CM | POA: Diagnosis not present

## 2021-02-15 DIAGNOSIS — G8929 Other chronic pain: Secondary | ICD-10-CM | POA: Diagnosis not present

## 2021-02-15 DIAGNOSIS — Z885 Allergy status to narcotic agent status: Secondary | ICD-10-CM | POA: Diagnosis not present

## 2021-02-15 DIAGNOSIS — E2749 Other adrenocortical insufficiency: Secondary | ICD-10-CM | POA: Diagnosis not present

## 2021-02-15 DIAGNOSIS — R202 Paresthesia of skin: Secondary | ICD-10-CM | POA: Diagnosis not present

## 2021-02-15 DIAGNOSIS — E2740 Unspecified adrenocortical insufficiency: Secondary | ICD-10-CM | POA: Diagnosis not present

## 2021-02-15 DIAGNOSIS — R627 Adult failure to thrive: Secondary | ICD-10-CM | POA: Diagnosis not present

## 2021-02-15 DIAGNOSIS — F419 Anxiety disorder, unspecified: Secondary | ICD-10-CM | POA: Diagnosis not present

## 2021-02-15 DIAGNOSIS — M549 Dorsalgia, unspecified: Secondary | ICD-10-CM | POA: Diagnosis not present

## 2021-02-15 DIAGNOSIS — E039 Hypothyroidism, unspecified: Secondary | ICD-10-CM | POA: Diagnosis not present

## 2021-02-15 MED ORDER — HYDROCORTISONE 10 MG PO TABS
ORAL_TABLET | ORAL | 0 refills | Status: DC
  Filled 2021-02-15 – 2021-04-20 (×2): qty 30, 30d supply, fill #0

## 2021-02-15 MED ORDER — PREDNISONE 5 MG PO TABS
ORAL_TABLET | ORAL | 0 refills | Status: DC
  Filled 2021-02-15: qty 30, 30d supply, fill #0

## 2021-02-15 MED ORDER — HYDROCORTISONE 20 MG PO TABS
20.0000 mg | ORAL_TABLET | Freq: Every morning | ORAL | 0 refills | Status: DC
  Filled 2021-02-15: qty 30, 30d supply, fill #0

## 2021-02-16 DIAGNOSIS — J849 Interstitial pulmonary disease, unspecified: Secondary | ICD-10-CM | POA: Diagnosis not present

## 2021-02-16 DIAGNOSIS — J9601 Acute respiratory failure with hypoxia: Secondary | ICD-10-CM | POA: Diagnosis not present

## 2021-02-16 DIAGNOSIS — M6281 Muscle weakness (generalized): Secondary | ICD-10-CM | POA: Diagnosis not present

## 2021-02-16 DIAGNOSIS — D860 Sarcoidosis of lung: Secondary | ICD-10-CM | POA: Diagnosis not present

## 2021-02-17 ENCOUNTER — Ambulatory Visit (HOSPITAL_COMMUNITY): Payer: 59 | Admitting: Physical Therapy

## 2021-02-17 ENCOUNTER — Other Ambulatory Visit (HOSPITAL_COMMUNITY): Payer: Self-pay

## 2021-02-17 ENCOUNTER — Other Ambulatory Visit: Payer: Self-pay

## 2021-02-17 ENCOUNTER — Encounter (HOSPITAL_COMMUNITY): Payer: Self-pay | Admitting: Physical Therapy

## 2021-02-17 DIAGNOSIS — M6281 Muscle weakness (generalized): Secondary | ICD-10-CM

## 2021-02-17 DIAGNOSIS — M5442 Lumbago with sciatica, left side: Secondary | ICD-10-CM | POA: Diagnosis not present

## 2021-02-17 DIAGNOSIS — G8929 Other chronic pain: Secondary | ICD-10-CM | POA: Diagnosis not present

## 2021-02-17 NOTE — Therapy (Signed)
[place], [place], [address] Phone: [phone]   Fax:  [phone]  Physical Therapy Evaluation  Patient Details  Name: Kathleen [name] MRN: [id number] Date of Birth: 1961/07/21 Referring Provider (PT): Sallee Lange   Encounter Date: 02/17/2021   PT End of Session - 02/17/21 0959     Visit Number 1    Number of Visits 12    Date for PT Re-Evaluation 03/31/21    Authorization Type [place] [employer]/ medicare    Progress Note Due on Visit 10    PT Start Time 0918    PT Stop Time 0955    PT Time Calculation (min) 37 min             Past Medical History:  Diagnosis Date   Adrenal insufficiency ([place])    Anemia    Avascular bone necrosis ([place])    Breast fibrocystic disorder    Chronic pain    Encounter for long-term opiate analgesic use 02/02/2020   Patient has chronic pain with femoral head avascular necrosis.  Is under pain contract   Fibromyalgia    Gastroesophageal reflux disease    Mitral valve prolapse    Orthostatic hypotension    Peripheral neuropathy    Restrictive lung disease    Moderate to severe   Sarcoidosis    Biopsy proven - [facility]   SOB (shortness of breath)    chronic    Past Surgical History:  Procedure Laterality Date   BIOPSY  10/17/2018   Procedure: BIOPSY;  Surgeon: Rogene Houston, MD;  [facility];  Service: Endoscopy;;  duodenum, antrum, gastric body   BREAST LUMPECTOMY  01/12/2011   right   ESOPHAGOGASTRODUODENOSCOPY (EGD) WITH PROPOFOL N/A 10/17/2018   Procedure: ESOPHAGOGASTRODUODENOSCOPY (EGD) WITH PROPOFOL;  Surgeon: Rogene Houston, MD;  [facility];  Service: Endoscopy;  Laterality: N/A;  2:25   TUBAL LIGATION      There were no vitals filed for this visit.    Subjective Assessment - 02/17/21 0928     Subjective Kathleen [name] states that she has had low back pain for over six months, she does not know what brought it on.  It is significantly  affecting her ability to complete her normal housework and ADL's    Pertinent History Peripheral neuropathy, sarcoidosis, mitral valve prolapse, fibro myalgia    Limitations Reading;Lifting;Sitting;Standing;Walking;House hold activities    How long can you sit comfortably? 10-15 minutes    How long can you stand comfortably? less than five minutes    How long can you walk comfortably? less than five minutes    Patient Stated Goals To get back to somewhat normal,   sleep better  she is waking about every two hours.    Currently in Pain? Yes    Pain Score 8     Pain Location Back    Pain Orientation Lower    Pain Descriptors / Indicators Aching    Pain Type Chronic pain    Pain Onset More than a month ago    Pain Frequency Constant    Aggravating Factors  weight bearing    Pain Relieving Factors nothing    Effect of Pain on Daily Activities limits                [facility] PT Assessment - 02/17/21 0001       Assessment   Medical Diagnosis B lumbar pain    Referring Provider (  PT) Sallee Lange    Onset Date/Surgical Date 08/27/20    Next MD Visit not scheduled    Prior Therapy none      Precautions   Precautions None      Restrictions   Weight Bearing Restrictions No      Balance Screen   Has the patient fallen in the past 6 months Yes    How many times? 1    Has the patient had a decrease in activity level because of a fear of falling?  Yes    Is the patient reluctant to leave their home because of a fear of falling?  No      Home Ecologist residence      Prior Function   Level of Independence Independent with basic ADLs    Vocation On disability    Leisure watch tv      Cognition   Overall Cognitive Status Within Functional Limits for tasks assessed      Observation/Other Assessments   Focus on Therapeutic Outcomes (FOTO)  30 ; 70% affected      Posture/Postural Control   Posture Comments protruding abdominal, increased lordosis       ROM / Strength   AROM / PROM / Strength Strength;AROM      AROM   AROM Assessment Site Lumbar    Lumbar Extension 30 degrees no change in back pain.   Pt sits down immediately   Lumbar - Right Side Bend 10 reps causes no change   Pt sits down immediately   Lumbar - Left Side Bend 8 reps causes no change   Pt sits down immediately     Strength   Strength Assessment Site Hip;Knee;Ankle    Right/Left Hip Right;Left    Right Hip Flexion 2/5    Right Hip Extension 2/5    Right Hip ABduction 2/5    Left Hip Flexion 2/5    Left Hip Extension 2/5    Left Hip ABduction 2/5    Right/Left Knee Right;Left    Right Knee Extension 3/5   cog wheel give with resistance   Left Knee Extension 3/5   cog wheel give with resistance   Right/Left Ankle Right;Left    Right Ankle Dorsiflexion 3/5   cog wheel give with resistance   Left Ankle Dorsiflexion 3/5   cog wheel give with resistance                       Objective measurements completed on examination: See above findings.       [facility] Adult PT Treatment/Exercise - 02/17/21 0001       Exercises   Exercises Lumbar      Lumbar Exercises: Seated   Other Seated Lumbar Exercises abdominal set x 5      Lumbar Exercises: Supine   Ab Set 5 reps    Other Supine Lumbar Exercises decompression exercises 1-3                    PT Education - 02/17/21 0959     Education Details HEP    Person(s) Educated Patient    Methods Explanation;Tactile cues;Verbal cues    Comprehension Verbalized understanding;Returned demonstration              PT Short Term Goals - 02/17/21 1131       PT SHORT TERM GOAL #1   Title Pt to be I in HEP to allow low  back pain to be no greater than a 7/10 to improve pt mobility.    Time 3    Period Weeks    Status New    Target Date 03/10/21      PT SHORT TERM GOAL #2   Title Pt to be able to stand for 5-10 minutes in order to make a small meal.    Time 3    Period Weeks     Status New      PT SHORT TERM GOAL #3   Title Pt to be able to verbalize the importance of good sitting/standing posture and biomechanics to reduce back pain    Time 3    Period Weeks    Status New               PT Long Term Goals - 02/17/21 1133       PT LONG TERM GOAL #1   Title Pt to be I in an advance HEP to allow low back pain to decrease to no greater than a 5/5    Time 6    Period Weeks    Status New    Target Date 03/31/21      PT LONG TERM GOAL #2   Title Pt to be able to walk for 10 minutes in order to be able to get he own mail, complete light household tasks    Time 6    Period Weeks    Status New      PT LONG TERM GOAL #3   Title Pt leg and core strength to improve to allow pt to be able to come sit to stand without the use of her UE and to sit without plopping in her chair    Time 6    Period Weeks    Status New      PT LONG TERM GOAL #4   Title PT to be able to sit for 60 minutes for traveling, eating at leisure    Time 6    Period Weeks                    Plan - 02/17/21 1001     Clinical Impression Statement Ms.  Kauffmann is a 60 yo female referred from insidious onset of low back pain. Evaluation demonstrates postrual dysfunction with protruding abdominal mm and increased lordosis, decreased ROM, decreased strength, increased pain and decreased activity toloerance. Ms. Witz will benefit from skilled PT to address these issues and maximize her functional ability.    Personal Factors and Comorbidities Comorbidity 3+    Comorbidities fibromyalgia, peripheral neuropathy, sacoidosis, mitral valve prolapse.    Examination-Activity Limitations Bathing;Bed Mobility;Bend;Caring for Others;Carry;Dressing;Hygiene/Grooming;Lift;Locomotion Level;Reach Overhead;Sit;Sleep;Squat;Stairs;Stand    Examination-Participation Restrictions Church;Cleaning;Community Activity;Driving;Laundry;Meal Prep;Occupation;Shop;Yard Work    Stability/Clinical Decision  Making Stable/Uncomplicated    Clinical Decision Making Moderate    Rehab Potential Good    PT Frequency 2x / week    PT Duration 6 weeks    PT Treatment/Interventions Functional mobility training;Therapeutic activities;Therapeutic exercise;Cryotherapy;Moist Heat;Balance training;Patient/family education;Manual techniques    PT Next Visit Plan test  2 minute walk, complete 1-5 on decompression begin nonweight bearing stretching and stability exercise progressing to standing as able.    PT Home Exercise Plan ab set both sitting and supine, 1-3 decompression exercises.             Patient will benefit from skilled therapeutic intervention in order to improve the following deficits and impairments:  Cardiopulmonary status limiting activity,  Decreased activity tolerance, Decreased balance, Decreased endurance, Decreased mobility, Decreased range of motion, Decreased strength, Difficulty walking, Hypomobility, Postural dysfunction, Obesity, Pain  Visit Diagnosis: Muscle weakness (generalized) - Plan: PT plan of care cert/re-cert  Chronic midline low back pain with left-sided sciatica - Plan: PT plan of care cert/re-cert     Problem List Patient Active Problem List   Diagnosis Date Noted   Weakness of left foot 01/09/2021   Left foot drop 01/09/2021   Cutaneous abscess of axilla 01/09/2021   Pulmonary HTN ([place]) 10/12/2020   Scalp abscess 09/29/2020   Dysuria 09/29/2020   Encounter for long-term opiate analgesic use 02/02/2020   Acute adrenal insufficiency ([place]) 01/27/2020   Interstitial lung disease ([place]) 04/14/2019   Non-intractable vomiting 09/22/2018   Nausea vomiting and diarrhea    Acute adrenal crisis ([place]) 10/15/2017   Gastroesophageal reflux disease 10/15/2017   Personal history of noncompliance with medical treatment, presenting hazards to health 04/26/2017   Hypothyroidism 01/15/2017   Symptomatic bradycardia    Adrenal insufficiency ([place]) 08/17/2016   Hypokalemia  08/17/2016   Chronic pain syndrome 04/20/2016   Vaginal bleeding 01/02/2016   Hypovolemic shock ([place]) 12/30/2015   Nausea and vomiting 12/29/2015   Hypotension 12/29/2015   Constipation 11/14/2015   Avascular necrosis of bone of hip, left ([place]) 04/05/2015   Bimalleolar fracture of right ankle 02/11/2015   Post herpetic neuralgia 10/22/2014   Hyperlipidemia 08/25/2014   Obstructive sleep apnea 02/18/2014   Obesity, unspecified 05/06/2013   Fibromyalgia 02/11/2013   Depression 02/11/2013   Urinary retention 02/02/2013   Pedal edema 01/22/2013   Orthostatic hypotension 85/46/2703   Helicobacter pylori gastritis 05/23/2012   Fibrocystic breast disease in female 04/09/2012   Anxiety state 10/19/2010   CHEST PAIN 11/22/2008   Sarcoidosis 06/21/2008   Rayetta Humphrey, PT CLT [phone]  02/17/2021, 11:38 AM  [place], [place], [address] Phone: [phone]   Fax:  [phone]  Name: Kathleen [name] MRN: [id number] Date of Birth: [birth year]

## 2021-02-19 DIAGNOSIS — J849 Interstitial pulmonary disease, unspecified: Secondary | ICD-10-CM | POA: Diagnosis not present

## 2021-02-19 DIAGNOSIS — D860 Sarcoidosis of lung: Secondary | ICD-10-CM | POA: Diagnosis not present

## 2021-02-19 DIAGNOSIS — M6281 Muscle weakness (generalized): Secondary | ICD-10-CM | POA: Diagnosis not present

## 2021-02-19 DIAGNOSIS — J9601 Acute respiratory failure with hypoxia: Secondary | ICD-10-CM | POA: Diagnosis not present

## 2021-02-20 ENCOUNTER — Telehealth: Payer: Self-pay | Admitting: Family Medicine

## 2021-02-20 MED ORDER — DOXYCYCLINE HYCLATE 100 MG PO TABS: 100.0000 mg | ORAL_TABLET | Freq: Two times a day (BID) | ORAL | 0 refills | Status: DC

## 2021-02-20 NOTE — Telephone Encounter (Signed)
Patient notified and scheduled office visit with Dr Nicki Reaper 02/21/21 at 11:30 am. Patient will use warm compresses as advised. Prescription sent electronically to pharmacy.

## 2021-02-20 NOTE — Telephone Encounter (Signed)
Pt calling in and states that she has a abscess under her left arm( gets boils frequently under arm). States that Dr.Scott usually has to lance it or put her on Doxycycline. abscess has been there a couple of days and is painful. No fever, chills, no n/v. Schedule is full at this time please advise. Thank you

## 2021-02-20 NOTE — Addendum Note (Signed)
Addended by: Dairl Ponder on: 02/20/2021 01:15 PM   Modules accepted: Orders

## 2021-02-20 NOTE — Telephone Encounter (Signed)
Doxycycline 100 mg 1 twice daily, #20, take with a snack and a tall glass of liquids  Office visit tomorrow at 1130 Warm compresses 20 minutes at a time frequently over the course of the next several days do the warm compresses every 2 hours

## 2021-02-21 ENCOUNTER — Other Ambulatory Visit: Payer: Self-pay

## 2021-02-21 ENCOUNTER — Ambulatory Visit (INDEPENDENT_AMBULATORY_CARE_PROVIDER_SITE_OTHER): Payer: 59 | Admitting: Internal Medicine

## 2021-02-21 ENCOUNTER — Ambulatory Visit (INDEPENDENT_AMBULATORY_CARE_PROVIDER_SITE_OTHER): Payer: 59 | Admitting: Family Medicine

## 2021-02-21 ENCOUNTER — Other Ambulatory Visit (INDEPENDENT_AMBULATORY_CARE_PROVIDER_SITE_OTHER): Payer: Self-pay

## 2021-02-21 ENCOUNTER — Encounter (INDEPENDENT_AMBULATORY_CARE_PROVIDER_SITE_OTHER): Payer: Self-pay

## 2021-02-21 ENCOUNTER — Encounter (INDEPENDENT_AMBULATORY_CARE_PROVIDER_SITE_OTHER): Payer: Self-pay | Admitting: Internal Medicine

## 2021-02-21 ENCOUNTER — Other Ambulatory Visit (HOSPITAL_COMMUNITY): Payer: Self-pay

## 2021-02-21 VITALS — BP 136/84 | Ht 71.0 in

## 2021-02-21 VITALS — BP 131/81 | HR 86 | Temp 97.5°F | Ht 70.0 in | Wt 237.0 lb

## 2021-02-21 DIAGNOSIS — K5900 Constipation, unspecified: Secondary | ICD-10-CM

## 2021-02-21 DIAGNOSIS — R1013 Epigastric pain: Secondary | ICD-10-CM | POA: Diagnosis not present

## 2021-02-21 DIAGNOSIS — R112 Nausea with vomiting, unspecified: Secondary | ICD-10-CM

## 2021-02-21 DIAGNOSIS — R1319 Other dysphagia: Secondary | ICD-10-CM

## 2021-02-21 DIAGNOSIS — L02412 Cutaneous abscess of left axilla: Secondary | ICD-10-CM

## 2021-02-21 MED ORDER — LINACLOTIDE 290 MCG PO CAPS
290.0000 ug | ORAL_CAPSULE | Freq: Every day | ORAL | 5 refills | Status: DC
  Filled 2021-02-21: qty 90, 90d supply, fill #0
  Filled 2021-05-23: qty 90, 90d supply, fill #1

## 2021-02-21 MED ORDER — OXYCODONE HCL 10 MG PO TABS: ORAL_TABLET | ORAL | 0 refills | Status: DC

## 2021-02-21 NOTE — Progress Notes (Signed)
Presenting complaint;  Nausea vomiting epigastric pain swallowing difficulty and constipation.  History of present illness.  Patient is 60 year old Caucasian female with multiple medical problems who is here for GI evaluation.  She was initially seen in January 2020 by Ms. Deberah Castle, NP who obtained gastric emptying study and it was normal.   She underwent esophagogastroduodenoscopy on 10/17/2018 revealing small sliding hiatal hernia and gastritis.  Gastric biopsies were negative for H. pylori and duodenal biopsy did not show any changes of celiac disease.  Patient states she has been coping with her nausea and vomiting but her symptoms have gotten worse over the last few months.  She has nausea after each meal and vomits at least 2-3 times a week.  Most recently she vomited yesterday.  She is having to take ondansetron daily.  She says it not as effective as it used to be.  She says it does not matter what she eats.  She also complains of postprandial epigastric pain.  She says heartburn does not occur very often maybe once or twice a week.  She feels medications are working.  She also reports having abdominal pain with FiberCon and she has been able to tolerate Benefiber in the past.  She also complains of dysphagia to pills and solids which started 1 month ago.  She points to upper sternal area as site of bolus obstruction.  She says she is a slow eater and she chews her food thoroughly.  She does not understand why she keeps gaining weight even though she is not able to eat much.  She also complains of worsening constipation.  She says Linzess at a dose of 145 mcg daily is not helping anymore.  She has taken mag citrate recently and yesterday she also took an enema with poor results.  She denies melena or frank rectal bleeding.  She may notice blood on the tissue. She was begun on doxycycline yesterday and she had I&D of left axillary abscess by Dr. Sallee Lange earlier today.  She has not experienced  fever or chills or diaphoreses. She remains with exertional dyspnea with minimal activity.  She has chronic hip pain and takes oxycodone 6 times a day.   Current Medications: Outpatient Encounter Medications as of 02/21/2021  Medication Sig   acetaminophen (TYLENOL) 500 MG tablet Take 1,000 mg by mouth every 8 (eight) hours as needed for headache.   docusate sodium (COLACE) 100 MG capsule Take 100 mg by mouth 2 (two) times daily.   doxycycline (VIBRA-TABS) 100 MG tablet Take 1 tablet (100 mg total) by mouth 2 (two) times daily. Take with a snack and tall glass of liquids   EPINEPHrine 0.3 mg/0.3 mL IJ SOAJ injection Inject 0.3 mLs (0.3 mg total) into the muscle as needed for anaphylaxis.   ferrous sulfate 325 (65 FE) MG EC tablet Take 325 mg by mouth daily with breakfast.   FLUoxetine (PROZAC) 20 MG tablet TAKE 1 TABLET BY MOUTH ONCE A DAY   fluticasone (FLONASE) 50 MCG/ACT nasal spray Place 2 sprays into both nostrils daily. (Patient taking differently: Place 2 sprays into both nostrils daily. As needed.)   hydrocortisone (CORTEF) 10 MG tablet Take 1 tablet (10 mg total) by mouth daily with lunch. (Patient taking differently: Take 10 mg by mouth daily. 2 tablets in the morning.)   hydrocortisone (CORTEF) 20 MG tablet Take 1 tablet (20 mg total) by mouth every morning. (Patient taking differently: Take 20 mg by mouth daily. Every after noon per patient.)  hydrOXYzine (ATARAX/VISTARIL) 25 MG tablet TAKE 1 TABLET BY MOUTH 3 TIMES DAILY AS NEEDED FOR ANXIETY   levothyroxine (SYNTHROID) 75 MCG tablet TAKE 1 TABLET BY MOUTH BEFORE BREAKFAST EVERY DAY EXCEPT TAKE 1/2 TABLET 1 DAY A WEEK   linaclotide (LINZESS) 145 MCG CAPS capsule TAKE 1 CAPSULE BY MOUTH ONCE A DAY BEFORE BREAKFAST   midodrine (PROAMATINE) 5 MG tablet TAKE 1 TABLET BY MOUTH TWICE DAILY WITH A MEAL   ondansetron (ZOFRAN-ODT) 8 MG disintegrating tablet TAKE 1 TABLET BY MOUTH EVERY 8 HOURS AS NEEDED FOR NAUSEA & VOMITING   Oxycodone HCl  10 MG TABS TAKE 1 TABLET BY MOUTH EVERY 4 HOURS AS NEEDED FOR PAIN (MAX 6 TABLETS DAILY) **FILL 09/25/20**   pantoprazole (PROTONIX) 40 MG tablet TAKE 1 TABLET BY MOUTH ONCE DAILY   predniSONE (DELTASONE) 5 MG tablet TAKE 2 TABLETS BY MOUTH ONCE A DAY   [DISCONTINUED] methocarbamol (ROBAXIN) 500 MG tablet Take 1 tablet (500 mg total) by mouth 3 (three) times daily.   [DISCONTINUED] hydrocortisone (CORTEF) 10 MG tablet TAKE 2 TABLETS BY MOUTH DAILY AT 8 AM AND 1 TABLET DAILY AT NOON   [DISCONTINUED] predniSONE (DELTASONE) 5 MG tablet Take 1 tablet (5 mg total) by mouth in the morning.   No facility-administered encounter medications on file as of 02/21/2021.   Past Medical History:  Diagnosis Date   Adrenal insufficiency (HCC)    Anemia    Avascular bone necrosis ([place])    Breast fibrocystic disorder    Chronic pain    Encounter for long-term opiate analgesic use 02/02/2020   Patient has chronic pain with femoral head avascular necrosis.  Is under pain contract   Fibromyalgia    Gastroesophageal reflux disease    Mitral valve prolapse    Orthostatic hypotension    Peripheral neuropathy    Restrictive lung disease    Moderate to severe   Sarcoidosis    Biopsy proven - [facility]   SOB (shortness of breath)    chronic   Past Surgical History:  Procedure Laterality Date   BIOPSY  10/17/2018   Procedure: BIOPSY;  Surgeon: Rogene Houston, MD;  [facility]: [facility];  Service: Endoscopy;;  duodenum, antrum, gastric body   BREAST LUMPECTOMY  01/12/2011   right   ESOPHAGOGASTRODUODENOSCOPY (EGD) WITH PROPOFOL N/A 10/17/2018   Procedure: ESOPHAGOGASTRODUODENOSCOPY (EGD) WITH PROPOFOL;  Surgeon: Rogene Houston, MD;  Location: [facility];  Service: Endoscopy;  Laterality: N/A;  2:25   TUBAL LIGATION     Allergies Allergies  Allergen Reactions   Bee Venom Anaphylaxis   Bactrim [Sulfamethoxazole-Trimethoprim] Nausea And Vomiting   Codeine Itching   Contrast Media [Iodinated [employer]  [employer]]     CT chest w/ contrast at OSH followed by SOB resolved w/ single dose steroids, never ENT swelling, intubation or pressors, has had multiple contrasted scans since   Morphine And Related Other (See Comments)    Unknown reaction- pt gets sick, dizzy, and confused Intolerance, not an allergy, received this in the emergency department recently without a problem.   Family history  Father is 65 years old.  He has dementia and is a nursing home.  Mother is 15 years old and doing well.  She has been treated for breast carcinoma and remains in remission.  She has sister age 36 in good health.  She lost her brother due to neurofibromatosis at age 32.  Social history  She is married.  She has grownup daughter in good health. She  is presently on disability.  She worked at [employer] in [employer] for 5 years.  She does not smoke cigarettes or drink alcohol.   Physical examination.  Blood pressure 131/81, pulse 86, temperature (!) 97.5 F (36.4 C), temperature source Oral, height _0  (1.778 m), weight 237 lb (107.5 kg), last menstrual period 05/17/2016. Patient is alert and in no acute distress. She is wearing a mask. She has a very small xanthelasma along medial aspects of upper eyelids Conjunctiva is pink. Sclera is nonicteric Oropharyngeal mucosa is normal. No neck masses or thyromegaly noted. Cardiac exam with regular rhythm normal S1 and S2. No murmur or gallop noted. Lungs are clear to auscultation. Abdomen is full.  Bowel sounds are normal.  On palpation abdomen is soft.  She has mild midepigastric tenderness.  No organomegaly or masses. No LE edema or clubbing noted.  Labs/studies Results:   CBC Latest Ref Rng & Units 06/28/2020 05/01/2020 04/30/2020  WBC 4.0 - 10.5 K/uL 6.1 10.1 7.9  Hemoglobin 12.0 - 15.0 g/dL 12.6 12.7 12.6  Hematocrit 36.0 - 46.0 % 38.8 39.2 38.5  Platelets 150 - 400 K/uL 326 331 304    CMP Latest Ref Rng & Units 11/09/2020 06/28/2020 05/24/2020  Glucose 65 - 99  mg/dL 118(H) 94 99  BUN 6 - 24 mg/dL _1 Creatinine 0.57 - 1.00 mg/dL 0.84 0.95 0.82  Sodium 134 - 144 mmol/L 140 140 139  Potassium 3.5 - 5.2 mmol/L 4.5 3.7 5.2  Chloride 96 - 106 mmol/L 104 100 101  CO2 20 - 29 mmol/L _2 Calcium 8.7 - 10.2 mg/dL 9.5 9.6 9.4  Total Protein 6.0 - 8.5 g/dL 6.0 7.0 6.9  Total Bilirubin 0.0 - 1.2 mg/dL <0.2 0.6 0.3  Alkaline Phos 44 - 121 IU/L 90 62 77  AST 0 - 40 IU/L 13 13(L) 12  ALT 0 - 32 IU/L _3 Hepatic Function Latest Ref Rng & Units 11/09/2020 06/28/2020 05/24/2020  Total Protein 6.0 - 8.5 g/dL 6.0 7.0 6.9  Albumin 3.8 - 4.9 g/dL 3.9 3.9 4.3  AST 0 - 40 IU/L 13 13(L) 12  ALT 0 - 32 IU/L _4 Alk Phosphatase 44 - 121 IU/L 90 62 77  Total Bilirubin 0.0 - 1.2 mg/dL <0.2 0.6 0.3  Bilirubin, Direct 0.00 - 0.40 mg/dL - - -     Assessment:  Patient is 60 year old Caucasian female with multiple medical problems including pulmonary sarcoidosis, peripheral neuropathy, fibromyalgia depression and adrenocortical insufficiency who has multiple GI issues which can be summarized as follows.  #1.  Chronic nausea and vomiting.  She also has postprandial epigastric pain.  Differential diagnosis includes peptic ulcer disease, gallbladder disease and she could have gastroparesis for which she was evaluated in January 2020 and gastric emptying study was normal. EGD in February 2020 revealed small sliding hiatal hernia and gastritis.  Gastric biopsy was negative for H. pylori and duodenal biopsy was negative for changes of celiac disease. Her symptoms have gotten worse and reevaluation would be appropriate.  #2.  Esophageal dysphagia.  It remains to be seen if she has esophageal stricture ring or a motility disorder.    #3.  Chronic constipation.  Constipation is felt to be due to her medications including narcotic.  Last colonoscopy was in August 2014 by Dr. May God of [facility] revealing multiple small polyps which are  hyperplastic and she also had internal and external hemorrhoids.  There  is room for dose escalation with Linzess.  Plan:  Abdominal ultrasound. Esophagogastroduodenoscopy with esophageal dilation under monitored anesthesia care. Patient will continue pantoprazole at current dose of 40 mg every morning. Increase dietary fiber as tolerated. Increase Linzess dose to to 90 mcg daily before breakfast. Office visit in 3 months.

## 2021-02-21 NOTE — Progress Notes (Signed)
   Subjective:    Patient ID: Kathleen [name], female    DOB: Jan 06, 1961, 60 y.o.   MRN: [id number]  HPI  Patient arrives for a abscess under her left arm. Patient states she started the doxycycline yesterday.Discussion yesterdayHad phone abscess underneath the left arm painful discomforting swelling denies high fever nausea vomiting  Review of Systems     Objective:   Physical Exam Lungs clear heart regular has abscess underneath the left arm       Assessment & Plan:   Numbed up appropriately Cleansed with Betadine 11.  Blade used to incision and drainage Fair amount of pus obtained No complications Nurse present during the procedure Dressing placed Finish out antibiotics use warm compresses notify us if any progressive troubles Timeout appropriate was given patient consented to the exam and I&D follow-up if any progressive troubles or problems  1 refill of pain medicine given patient is to follow-up within several weeks for in person visit regarding pain

## 2021-02-21 NOTE — Patient Instructions (Signed)
Esophagogastroduodenoscopy with esophageal dilation to be scheduled. Abdominal US to be scheduled

## 2021-02-21 NOTE — H&P (View-Only) (Signed)
Presenting complaint;  Nausea vomiting epigastric pain swallowing difficulty and constipation.  History of present illness.  Patient is 60 year old Caucasian female with multiple medical problems who is here for GI evaluation.  She was initially seen in January 2020 by Ms. Deberah Castle, NP who obtained gastric emptying study and it was normal.   She underwent esophagogastroduodenoscopy on 10/17/2018 revealing small sliding hiatal hernia and gastritis.  Gastric biopsies were negative for H. pylori and duodenal biopsy did not show any changes of celiac disease.  Patient states she has been coping with her nausea and vomiting but her symptoms have gotten worse over the last few months.  She has nausea after each meal and vomits at least 2-3 times a week.  Most recently she vomited yesterday.  She is having to take ondansetron daily.  She says it not as effective as it used to be.  She says it does not matter what she eats.  She also complains of postprandial epigastric pain.  She says heartburn does not occur very often maybe once or twice a week.  She feels medications are working.  She also reports having abdominal pain with FiberCon and she has been able to tolerate Benefiber in the past.  She also complains of dysphagia to pills and solids which started 1 month ago.  She points to upper sternal area as site of bolus obstruction.  She says she is a slow eater and she chews her food thoroughly.  She does not understand why she keeps gaining weight even though she is not able to eat much.  She also complains of worsening constipation.  She says Linzess at a dose of 145 mcg daily is not helping anymore.  She has taken mag citrate recently and yesterday she also took an enema with poor results.  She denies melena or frank rectal bleeding.  She may notice blood on the tissue. She was begun on doxycycline yesterday and she had I&D of left axillary abscess by Dr. Sallee Lange earlier today.  She has not experienced  fever or chills or diaphoreses. She remains with exertional dyspnea with minimal activity.  She has chronic hip pain and takes oxycodone 6 times a day.   Current Medications: Outpatient Encounter Medications as of 02/21/2021  Medication Sig   acetaminophen (TYLENOL) 500 MG tablet Take 1,000 mg by mouth every 8 (eight) hours as needed for headache.   docusate sodium (COLACE) 100 MG capsule Take 100 mg by mouth 2 (two) times daily.   doxycycline (VIBRA-TABS) 100 MG tablet Take 1 tablet (100 mg total) by mouth 2 (two) times daily. Take with a snack and tall glass of liquids   EPINEPHrine 0.3 mg/0.3 mL IJ SOAJ injection Inject 0.3 mLs (0.3 mg total) into the muscle as needed for anaphylaxis.   ferrous sulfate 325 (65 FE) MG EC tablet Take 325 mg by mouth daily with breakfast.   FLUoxetine (PROZAC) 20 MG tablet TAKE 1 TABLET BY MOUTH ONCE A DAY   fluticasone (FLONASE) 50 MCG/ACT nasal spray Place 2 sprays into both nostrils daily. (Patient taking differently: Place 2 sprays into both nostrils daily. As needed.)   hydrocortisone (CORTEF) 10 MG tablet Take 1 tablet (10 mg total) by mouth daily with lunch. (Patient taking differently: Take 10 mg by mouth daily. 2 tablets in the morning.)   hydrocortisone (CORTEF) 20 MG tablet Take 1 tablet (20 mg total) by mouth every morning. (Patient taking differently: Take 20 mg by mouth daily. Every after noon per patient.)  hydrOXYzine (ATARAX/VISTARIL) 25 MG tablet TAKE 1 TABLET BY MOUTH 3 TIMES DAILY AS NEEDED FOR ANXIETY   levothyroxine (SYNTHROID) 75 MCG tablet TAKE 1 TABLET BY MOUTH BEFORE BREAKFAST EVERY DAY EXCEPT TAKE 1/2 TABLET 1 DAY A WEEK   linaclotide (LINZESS) 145 MCG CAPS capsule TAKE 1 CAPSULE BY MOUTH ONCE A DAY BEFORE BREAKFAST   midodrine (PROAMATINE) 5 MG tablet TAKE 1 TABLET BY MOUTH TWICE DAILY WITH A MEAL   ondansetron (ZOFRAN-ODT) 8 MG disintegrating tablet TAKE 1 TABLET BY MOUTH EVERY 8 HOURS AS NEEDED FOR NAUSEA & VOMITING   Oxycodone HCl  10 MG TABS TAKE 1 TABLET BY MOUTH EVERY 4 HOURS AS NEEDED FOR PAIN (MAX 6 TABLETS DAILY) **FILL 09/25/20**   pantoprazole (PROTONIX) 40 MG tablet TAKE 1 TABLET BY MOUTH ONCE DAILY   predniSONE (DELTASONE) 5 MG tablet TAKE 2 TABLETS BY MOUTH ONCE A DAY   [DISCONTINUED] methocarbamol (ROBAXIN) 500 MG tablet Take 1 tablet (500 mg total) by mouth 3 (three) times daily.   [DISCONTINUED] hydrocortisone (CORTEF) 10 MG tablet TAKE 2 TABLETS BY MOUTH DAILY AT 8 AM AND 1 TABLET DAILY AT NOON   [DISCONTINUED] predniSONE (DELTASONE) 5 MG tablet Take 1 tablet (5 mg total) by mouth in the morning.   No facility-administered encounter medications on file as of 02/21/2021.   Past Medical History:  Diagnosis Date   Adrenal insufficiency (HCC)    Anemia    Avascular bone necrosis ([place])    Breast fibrocystic disorder    Chronic pain    Encounter for long-term opiate analgesic use 02/02/2020   Patient has chronic pain with femoral head avascular necrosis.  Is under pain contract   Fibromyalgia    Gastroesophageal reflux disease    Mitral valve prolapse    Orthostatic hypotension    Peripheral neuropathy    Restrictive lung disease    Moderate to severe   Sarcoidosis    Biopsy proven - [facility]   SOB (shortness of breath)    chronic   Past Surgical History:  Procedure Laterality Date   BIOPSY  10/17/2018   Procedure: BIOPSY;  Surgeon: Rogene Houston, MD;  [facility];  Service: Endoscopy;;  duodenum, antrum, gastric body   BREAST LUMPECTOMY  01/12/2011   right   ESOPHAGOGASTRODUODENOSCOPY (EGD) WITH PROPOFOL N/A 10/17/2018   Procedure: ESOPHAGOGASTRODUODENOSCOPY (EGD) WITH PROPOFOL;  Surgeon: Rogene Houston, MD;  [facility]: [facility];  Service: Endoscopy;  Laterality: N/A;  2:25   TUBAL LIGATION     Allergies Allergies  Allergen Reactions   Bee Venom Anaphylaxis   Bactrim [Sulfamethoxazole-Trimethoprim] Nausea And Vomiting   Codeine Itching   Contrast Media [Iod[employer]  [employer]]     CT chest w/ contrast at OSH followed by SOB resolved w/ single dose steroids, never ENT swelling, intubation or pressors, has had multiple contrasted scans since   Morphine And Related Other (See Comments)    Unknown reaction- pt gets sick, dizzy, and confused Intolerance, not an allergy, received this in the emergency department recently without a problem.   Family history  Father is 65 years old.  He has dementia and is a nursing home.  Mother is 15 years old and doing well.  She has been treated for breast carcinoma and remains in remission.  She has sister age 36 in good health.  She lost her brother due to neurofibromatosis at age 32.  Social history  She is married.  She has grownup daughter in good health. She  is presently on disability.  She worked at [employer] in [employer] for 5 years.  She does not smoke cigarettes or drink alcohol.   Physical examination.  Blood pressure 131/81, pulse 86, temperature (!) 97.5 F (36.4 C), temperature source Oral, height _0  (1.778 m), weight 237 lb (107.5 kg), last menstrual period 05/17/2016. Patient is alert and in no acute distress. She is wearing a mask. She has a very small xanthelasma along medial aspects of upper eyelids Conjunctiva is pink. Sclera is nonicteric Oropharyngeal mucosa is normal. No neck masses or thyromegaly noted. Cardiac exam with regular rhythm normal S1 and S2. No murmur or gallop noted. Lungs are clear to auscultation. Abdomen is full.  Bowel sounds are normal.  On palpation abdomen is soft.  She has mild midepigastric tenderness.  No organomegaly or masses. No LE edema or clubbing noted.  Labs/studies Results:   CBC Latest Ref Rng & Units 06/28/2020 05/01/2020 04/30/2020  WBC 4.0 - 10.5 K/uL 6.1 10.1 7.9  Hemoglobin 12.0 - 15.0 g/dL 12.6 12.7 12.6  Hematocrit 36.0 - 46.0 % 38.8 39.2 38.5  Platelets 150 - 400 K/uL 326 331 304    CMP Latest Ref Rng & Units 11/09/2020 06/28/2020 05/24/2020  Glucose 65 - 99  mg/dL 118(H) 94 99  BUN 6 - 24 mg/dL _1 Creatinine 0.57 - 1.00 mg/dL 0.84 0.95 0.82  Sodium 134 - 144 mmol/L 140 140 139  Potassium 3.5 - 5.2 mmol/L 4.5 3.7 5.2  Chloride 96 - 106 mmol/L 104 100 101  CO2 20 - 29 mmol/L _2 Calcium 8.7 - 10.2 mg/dL 9.5 9.6 9.4  Total Protein 6.0 - 8.5 g/dL 6.0 7.0 6.9  Total Bilirubin 0.0 - 1.2 mg/dL <0.2 0.6 0.3  Alkaline Phos 44 - 121 IU/L 90 62 77  AST 0 - 40 IU/L 13 13(L) 12  ALT 0 - 32 IU/L _3 Hepatic Function Latest Ref Rng & Units 11/09/2020 06/28/2020 05/24/2020  Total Protein 6.0 - 8.5 g/dL 6.0 7.0 6.9  Albumin 3.8 - 4.9 g/dL 3.9 3.9 4.3  AST 0 - 40 IU/L 13 13(L) 12  ALT 0 - 32 IU/L _4 Alk Phosphatase 44 - 121 IU/L 90 62 77  Total Bilirubin 0.0 - 1.2 mg/dL <0.2 0.6 0.3  Bilirubin, Direct 0.00 - 0.40 mg/dL - - -     Assessment:  Patient is 60 year old Caucasian female with multiple medical problems including pulmonary sarcoidosis, peripheral neuropathy, fibromyalgia depression and adrenocortical insufficiency who has multiple GI issues which can be summarized as follows.  #1.  Chronic nausea and vomiting.  She also has postprandial epigastric pain.  Differential diagnosis includes peptic ulcer disease, gallbladder disease and she could have gastroparesis for which she was evaluated in January 2020 and gastric emptying study was normal. EGD in February 2020 revealed small sliding hiatal hernia and gastritis.  Gastric biopsy was negative for H. pylori and duodenal biopsy was negative for changes of celiac disease. Her symptoms have gotten worse and reevaluation would be appropriate.  #2.  Esophageal dysphagia.  It remains to be seen if she has esophageal stricture ring or a motility disorder.    #3.  Chronic constipation.  Constipation is felt to be due to her medications including narcotic.  Last colonoscopy was in August 2014 by Dr. May God of [facility] revealing multiple small polyps which are  hyperplastic and she also had internal and external hemorrhoids.  There  is room for dose escalation with Linzess.  Plan:  Abdominal ultrasound. Esophagogastroduodenoscopy with esophageal dilation under monitored anesthesia care. Patient will continue pantoprazole at current dose of 40 mg every morning. Increase dietary fiber as tolerated. Increase Linzess dose to to 90 mcg daily before breakfast. Office visit in 3 months.

## 2021-02-22 ENCOUNTER — Ambulatory Visit (HOSPITAL_COMMUNITY): Payer: 59 | Admitting: Physical Therapy

## 2021-02-22 DIAGNOSIS — G8929 Other chronic pain: Secondary | ICD-10-CM

## 2021-02-22 DIAGNOSIS — M6281 Muscle weakness (generalized): Secondary | ICD-10-CM

## 2021-02-22 DIAGNOSIS — M5442 Lumbago with sciatica, left side: Secondary | ICD-10-CM | POA: Diagnosis not present

## 2021-02-22 NOTE — Therapy (Signed)
[place], [place], [address] Phone: [phone]   Fax:  [phone]  Physical Therapy Treatment  Patient Details  Name: Kathleen [name] MRN: [id number] Date of Birth: 08-22-1961 Referring Provider (PT): Sallee Lange   Encounter Date: 02/22/2021   PT End of Session - 02/22/21 0912     Visit Number 2    Number of Visits 12    Date for PT Re-Evaluation 03/31/21    Authorization Type [place] [employer]/ medicare    Progress Note Due on Visit 10    PT Start Time 0905    PT Stop Time 0945    PT Time Calculation (min) 40 min    Activity Tolerance Patient tolerated treatment well    Behavior During Therapy [facility] for tasks assessed/performed             Past Medical History:  Diagnosis Date   Adrenal insufficiency ([place])    Anemia    Avascular bone necrosis ([place])    Breast fibrocystic disorder    Chronic pain    Encounter for long-term opiate analgesic use 02/02/2020   Patient has chronic pain with femoral head avascular necrosis.  Is under pain contract   Fibromyalgia    Gastroesophageal reflux disease    Mitral valve prolapse    Orthostatic hypotension    Peripheral neuropathy    Restrictive lung disease    Moderate to severe   Sarcoidosis    Biopsy proven - [facility]   SOB (shortness of breath)    chronic    Past Surgical History:  Procedure Laterality Date   BIOPSY  10/17/2018   Procedure: BIOPSY;  Surgeon: Rogene Houston, MD;  [facility];  Service: Endoscopy;;  duodenum, antrum, gastric body   BREAST LUMPECTOMY  01/12/2011   right   ESOPHAGOGASTRODUODENOSCOPY (EGD) WITH PROPOFOL N/A 10/17/2018   Procedure: ESOPHAGOGASTRODUODENOSCOPY (EGD) WITH PROPOFOL;  Surgeon: Rogene Houston, MD;  Location[facility];  Service: Endoscopy;  Laterality: N/A;  2:25   TUBAL LIGATION      There were no vitals filed for this visit.   Subjective Assessment - 02/22/21 0912     Subjective Patient says she  has been trying HEP but with everything going on its been hard.    Pertinent History Peripheral neuropathy, sarcoidosis, mitral valve prolapse, fibro myalgia    Limitations Reading;Lifting;Sitting;Standing;Walking;House hold activities    How long can you sit comfortably? 10-15 minutes    How long can you stand comfortably? less than five minutes    How long can you walk comfortably? less than five minutes    Patient Stated Goals To get back to somewhat normal,   sleep better  she is waking about every two hours.    Currently in Pain? Yes    Pain Score 8     Pain Location Back    Pain Orientation Posterior;Lower    Pain Descriptors / Indicators Aching    Pain Onset More than a month ago    Pain Frequency Constant                               [facility] Adult PT Treatment/Exercise - 02/22/21 0001       Lumbar Exercises: Supine   Ab Set 10 reps    Other Supine Lumbar Exercises decompression exercises 1-5 x 10 each      Manual Therapy   Manual  Therapy Soft tissue mobilization    Manual therapy comments Completed separate form all other activity    Soft tissue mobilization IASTM using message gun Lv 10 to lumbar paraspinals patient in sidelying                      PT Short Term Goals - 02/17/21 1131       PT SHORT TERM GOAL #1   Title Pt to be I in HEP to allow low back pain to be no greater than a 7/10 to improve pt mobility.    Time 3    Period Weeks    Status New    Target Date 03/10/21      PT SHORT TERM GOAL #2   Title Pt to be able to stand for 5-10 minutes in order to make a small meal.    Time 3    Period Weeks    Status New      PT SHORT TERM GOAL #3   Title Pt to be able to verbalize the importance of good sitting/standing posture and biomechanics to reduce back pain    Time 3    Period Weeks    Status New               PT Long Term Goals - 02/17/21 1133       PT LONG TERM GOAL #1   Title Pt to be I in an advance HEP  to allow low back pain to decrease to no greater than a 5/5    Time 6    Period Weeks    Status New    Target Date 03/31/21      PT LONG TERM GOAL #2   Title Pt to be able to walk for 10 minutes in order to be able to get he own mail, complete light household tasks    Time 6    Period Weeks    Status New      PT LONG TERM GOAL #3   Title Pt leg and core strength to improve to allow pt to be able to come sit to stand without the use of her UE and to sit without plopping in her chair    Time 6    Period Weeks    Status New      PT LONG TERM GOAL #4   Title PT to be able to sit for 60 minutes for traveling, eating at leisure    Time 6    Period Weeks                   Plan - 02/22/21 0947     Clinical Impression Statement Initiated treatment. Reviewed goals and HEP. Patient with labored movement and discomfort with most position change and bed mobility. Encouraged patient to perform exercise within pain free tolerance. Performed IASTM using theragun to address pain and muscle restriction. Patient noting slight improvement in symptoms post treatment. Will assess response next visit.    Personal Factors and Comorbidities Comorbidity 3+    Comorbidities fibromyalgia, peripheral neuropathy, sacoidosis, mitral valve prolapse.    Examination-Activity Limitations Bathing;Bed Mobility;Bend;Caring for Others;Carry;Dressing;Hygiene/Grooming;Lift;Locomotion Level;Reach Overhead;Sit;Sleep;Squat;Stairs;Stand    Examination-Participation Restrictions Church;Cleaning;Community Activity;Driving;Laundry;Meal Prep;Occupation;Shop;Yard Work    Stability/Clinical Decision Making Stable/Uncomplicated    Rehab Potential Good    PT Frequency 2x / week    PT Duration 6 weeks    PT Treatment/Interventions Functional mobility training;Therapeutic activities;Therapeutic exercise;Cryotherapy;Moist Heat;Balance training;Patient/family education;Manual techniques  PT Next Visit Plan test  2 minute  walk (not tolertaed 6/30 can try again next visit) Progress core and posture, manual as needed.    PT Home Exercise Plan ab set both sitting and supine, 1-3 decompression exercises.    Consulted and Agree with Plan of Care Patient             Patient will benefit from skilled therapeutic intervention in order to improve the following deficits and impairments:  Cardiopulmonary status limiting activity, Decreased activity tolerance, Decreased balance, Decreased endurance, Decreased mobility, Decreased range of motion, Decreased strength, Difficulty walking, Hypomobility, Postural dysfunction, Obesity, Pain  Visit Diagnosis: Muscle weakness (generalized)  Chronic midline low back pain with left-sided sciatica     Problem List Patient Active Problem List   Diagnosis Date Noted   Abdominal pain, epigastric 02/21/2021   Esophageal dysphagia 02/21/2021   Weakness of left foot 01/09/2021   Left foot drop 01/09/2021   Cutaneous abscess of axilla 01/09/2021   Pulmonary HTN ([place]) 10/12/2020   Scalp abscess 09/29/2020   Dysuria 09/29/2020   Encounter for long-term opiate analgesic use 02/02/2020   Acute adrenal insufficiency ([place]) 01/27/2020   Interstitial lung disease ([place]) 04/14/2019   Non-intractable vomiting 09/22/2018   Nausea vomiting and diarrhea    Acute adrenal crisis ([place]) 10/15/2017   Gastroesophageal reflux disease 10/15/2017   Personal history of noncompliance with medical treatment, presenting hazards to health 04/26/2017   Hypothyroidism 01/15/2017   Symptomatic bradycardia    Adrenal insufficiency ([place]) 08/17/2016   Hypokalemia 08/17/2016   Chronic pain syndrome 04/20/2016   Vaginal bleeding 01/02/2016   Hypovolemic shock ([place]) 12/30/2015   Nausea and vomiting 12/29/2015   Hypotension 12/29/2015   Constipation 11/14/2015   Avascular necrosis of bone of hip, left ([place]) 04/05/2015   Bimalleolar fracture of right ankle 02/11/2015   Post herpetic neuralgia  10/22/2014   Hyperlipidemia 08/25/2014   Obstructive sleep apnea 02/18/2014   Obesity, unspecified 05/06/2013   Fibromyalgia 02/11/2013   Depression 02/11/2013   Urinary retention 02/02/2013   Pedal edema 01/22/2013   Orthostatic hypotension 59/16/3846   Helicobacter pylori gastritis 05/23/2012   Fibrocystic breast disease in female 04/09/2012   Anxiety state 10/19/2010   CHEST PAIN 11/22/2008   Sarcoidosis 06/21/2008   10:42 AM, 02/22/21 Josue Hector PT DPT  Physical Therapist with [facility]  [phone] [place], [place], [address] Phone: [phone]   Fax:  [phone]  Name: Kathleen [name] MRN: [id number] Date of Birth: [birth year]

## 2021-02-23 ENCOUNTER — Other Ambulatory Visit (HOSPITAL_COMMUNITY): Payer: Self-pay

## 2021-02-23 ENCOUNTER — Other Ambulatory Visit: Payer: Self-pay | Admitting: Family Medicine

## 2021-02-23 MED ORDER — OXYCODONE HCL 10 MG PO TABS
ORAL_TABLET | ORAL | 0 refills | Status: DC
  Filled 2021-02-23: qty 180, 30d supply, fill #0

## 2021-02-23 NOTE — Telephone Encounter (Signed)
Received call from patient to follow up on Rx refill for Oxycodone HCl 10 MG TABS [[id number]]   Rx sent to [facility]. Patient requesting Rx be resent to [facility]. Patient also requesting to receive Rx today rather than having to wait for tomorrow.   Please advise at [phone].

## 2021-02-24 ENCOUNTER — Other Ambulatory Visit: Payer: Self-pay

## 2021-02-24 ENCOUNTER — Ambulatory Visit (HOSPITAL_COMMUNITY): Payer: 59 | Attending: Family Medicine

## 2021-02-24 ENCOUNTER — Ambulatory Visit: Payer: 59 | Admitting: Neurology

## 2021-02-24 ENCOUNTER — Other Ambulatory Visit (HOSPITAL_COMMUNITY): Payer: Self-pay

## 2021-02-24 ENCOUNTER — Telehealth (HOSPITAL_COMMUNITY): Payer: Self-pay

## 2021-02-24 ENCOUNTER — Ambulatory Visit (HOSPITAL_COMMUNITY)
Admission: RE | Admit: 2021-02-24 | Discharge: 2021-02-24 | Disposition: A | Discharge status: Home / Self Care | Payer: 59 | Source: Ambulatory Visit | Attending: Internal Medicine | Admitting: Internal Medicine

## 2021-02-24 DIAGNOSIS — K7689 Other specified diseases of liver: Secondary | ICD-10-CM | POA: Diagnosis not present

## 2021-02-24 DIAGNOSIS — R112 Nausea with vomiting, unspecified: Secondary | ICD-10-CM | POA: Insufficient documentation

## 2021-02-24 DIAGNOSIS — R1013 Epigastric pain: Secondary | ICD-10-CM | POA: Insufficient documentation

## 2021-02-24 DIAGNOSIS — G8929 Other chronic pain: Secondary | ICD-10-CM | POA: Insufficient documentation

## 2021-02-24 DIAGNOSIS — M5442 Lumbago with sciatica, left side: Secondary | ICD-10-CM | POA: Insufficient documentation

## 2021-02-24 DIAGNOSIS — M6281 Muscle weakness (generalized): Secondary | ICD-10-CM | POA: Insufficient documentation

## 2021-02-24 NOTE — Telephone Encounter (Signed)
No show, called and spoke to pt concerning missed apt, stated she forgot.  Reminded next apt date and time with contact information given.  Educated on no show policy and encouraged to call and cancel/reschedule if unable to make it to future apts.    Ihor Austin, LPTA/CLT; Delana Meyer [phone]

## 2021-02-28 ENCOUNTER — Other Ambulatory Visit (HOSPITAL_COMMUNITY): Payer: Self-pay

## 2021-02-28 MED FILL — Prednisone Tab 5 MG: ORAL | 90 days supply | Qty: 180 | Fill #0 | Status: AC

## 2021-03-01 ENCOUNTER — Encounter (INDEPENDENT_AMBULATORY_CARE_PROVIDER_SITE_OTHER): Payer: Self-pay

## 2021-03-01 ENCOUNTER — Encounter (HOSPITAL_COMMUNITY): Payer: Self-pay | Admitting: Physical Therapy

## 2021-03-01 ENCOUNTER — Other Ambulatory Visit (HOSPITAL_COMMUNITY): Payer: Self-pay

## 2021-03-01 ENCOUNTER — Other Ambulatory Visit: Payer: Self-pay

## 2021-03-01 ENCOUNTER — Ambulatory Visit (HOSPITAL_COMMUNITY): Payer: 59 | Admitting: Physical Therapy

## 2021-03-01 DIAGNOSIS — M6281 Muscle weakness (generalized): Secondary | ICD-10-CM

## 2021-03-01 DIAGNOSIS — M5442 Lumbago with sciatica, left side: Secondary | ICD-10-CM | POA: Diagnosis not present

## 2021-03-01 DIAGNOSIS — R112 Nausea with vomiting, unspecified: Secondary | ICD-10-CM | POA: Diagnosis not present

## 2021-03-01 DIAGNOSIS — G8929 Other chronic pain: Secondary | ICD-10-CM

## 2021-03-01 NOTE — Patient Instructions (Signed)
Kathleen [name]  03/01/2021     @PREFPERIOPPHARMACY @   Your procedure is scheduled on  03/03/2021.   Report to [facility] at  1100  A.M.  Call this number if you have problems the morning of surgery:  [phone]   Remember:  Follow the diet instructions given to you by the office.    Take these medicines the morning of surgery with A SIP OF WATER     prozac, cortef, hydroxyzine, levothyroxine, zofran (if needed), oxycodone (if needed), protonix, prednisone.     Do not wear jewelry, make-up or nail polish.  Do not wear lotions, powders, or perfumes, or deodorant.  Do not shave 48 hours prior to surgery.  Men may shave face and neck.  Do not bring valuables to the hospital.  [facility] is not responsible for any belongings or valuables.  Contacts, dentures or bridgework may not be worn into surgery.  Leave your suitcase in the car.  After surgery it may be brought to your room.  For patients admitted to the hospital, discharge time will be determined by your treatment team.  Patients discharged the day of surgery will not be allowed to drive home and must have someone with them for 24 hours.    Special instructions:    DO NOT smoke tobacco or vape for 24 hours before your procedure.  Please read over the following fact sheets that you were given. Anesthesia Post-op Instructions and Care and Recovery After Surgery      Upper Endoscopy, Adult, Care After This sheet gives you information about how to care for yourself after your procedure. Your health care provider may also give you more specific instructions. If you have problems or questions, contact your health careprovider. What can I expect after the procedure? After the procedure, it is common to have: A sore throat. Mild stomach pain or discomfort. Bloating. Nausea. Follow these instructions at home:  Follow instructions from your health care provider about what to eat or drink after your  procedure. Return to your normal activities as told by your health care provider. Ask your health care provider what activities are safe for you. Take over-the-counter and prescription medicines only as told by your health care provider. If you were given a sedative during the procedure, it can affect you for several hours. Do not drive or operate machinery until your health care provider says that it is safe. Keep all follow-up visits as told by your health care provider. This is important. Contact a health care provider if you have: A sore throat that lasts longer than one day. Trouble swallowing. Get help right away if: You vomit blood or your vomit looks like coffee grounds. You have: A fever. Bloody, black, or tarry stools. A severe sore throat or you cannot swallow. Difficulty breathing. Severe pain in your chest or abdomen. Summary After the procedure, it is common to have a sore throat, mild stomach discomfort, bloating, and nausea. If you were given a sedative during the procedure, it can affect you for several hours. Do not drive or operate machinery until your health care provider says that it is safe. Follow instructions from your health care provider about what to eat or drink after your procedure. Return to your normal activities as told by your health care provider. This information is not intended to replace advice given to you by your health care provider. Make sure you discuss any questions you have with  your healthcare provider. Document Revised: 08/11/2019 Document Reviewed: 01/13/2018 Elsevier Patient Education  2022 [employer]. https://www.asge.org/home/for-patients/patient-information/understanding-eso-dilation-updated">  Esophageal Dilatation Esophageal dilatation, also called esophageal dilation, is a procedure to widen or open a blocked or narrowed part of the esophagus. The esophagus is the part of the body that moves food and liquid from the mouth to the  stomach. You may need this procedure if: You have a buildup of scar tissue in your esophagus that makes it difficult, painful, or impossible to swallow. This can be caused by gastroesophageal reflux disease (GERD). You have cancer of the esophagus. There is a problem with how food moves through your esophagus. In some cases, you may need this procedure repeated at a later time to dilatethe esophagus gradually. Tell a health care provider about: Any allergies you have. All medicines you are taking, including vitamins, herbs, eye drops, creams, and over-the-counter medicines. Any problems you or family members have had with anesthetic medicines. Any blood disorders you have. Any surgeries you have had. Any medical conditions you have. Any antibiotic medicines you are required to take before dental procedures. Whether you are pregnant or may be pregnant. What are the risks? Generally, this is a safe procedure. However, problems may occur, including: Bleeding due to a tear in the lining of the esophagus. A hole, or perforation, in the esophagus. What happens before the procedure? Ask your health care provider about: Changing or stopping your regular medicines. This is especially important if you are taking diabetes medicines or blood thinners. Taking medicines such as aspirin and ibuprofen. These medicines can thin your blood. Do not take these medicines unless your health care provider tells you to take them. Taking over-the-counter medicines, vitamins, herbs, and supplements. Follow instructions from your health care provider about eating or drinking restrictions. Plan to have a responsible adult take you home from the hospital or clinic. Plan to have a responsible adult care for you for the time you are told after you leave the hospital or clinic. This is important. What happens during the procedure? You may be given a medicine to help you relax (sedative). A numbing medicine may be  sprayed into the back of your throat, or you may gargle the medicine. Your health care provider may perform the dilatation using various surgical instruments, such as: Simple dilators. This instrument is carefully placed in the esophagus to stretch it. Guided wire bougies. This involves using an endoscope to insert a wire into the esophagus. A dilator is passed over this wire to enlarge the esophagus. Then the wire is removed. Balloon dilators. An endoscope with a small balloon is inserted into the esophagus. The balloon is inflated to stretch the esophagus and open it up. The procedure may vary among health care providers and hospitals. What can I expect after the procedure? Your blood pressure, heart rate, breathing rate, and blood oxygen level will be monitored until you leave the hospital or clinic. Your throat may feel slightly sore and numb. This will get better over time. You will not be allowed to eat or drink until your throat is no longer numb. When you are able to drink, urinate, and sit on the edge of the bed without nausea or dizziness, you may be able to return home. Follow these instructions at home: Take over-the-counter and prescription medicines only as told by your health care provider. If you were given a sedative during the procedure, it can affect you for several hours. Do not drive or operate machinery until  your health care provider says that it is safe. Plan to have a responsible adult care for you for the time you are told. This is important. Follow instructions from your health care provider about any eating or drinking restrictions. Do not use any products that contain nicotine or tobacco, such as cigarettes, e-cigarettes, and chewing tobacco. If you need help quitting, ask your health care provider. Keep all follow-up visits. This is important. Contact a health care provider if: You have a fever. You have pain that is not relieved by medicine. Get help right away  if: You have chest pain. You have trouble breathing. You have trouble swallowing. You vomit blood. You have black, tarry, or bloody stools. These symptoms may represent a serious problem that is an emergency. Do not wait to see if the symptoms will go away. Get medical help right away. Call your local emergency services (911 in the [place] Do not drive yourself to the hospital. Summary Esophageal dilatation, also called esophageal dilation, is a procedure to widen or open a blocked or narrowed part of the esophagus. Plan to have a responsible adult take you home from the hospital or clinic. For this procedure, a numbing medicine may be sprayed into the back of your throat, or you may gargle the medicine. Do not drive or operate machinery until your health care provider says that it is safe. This information is not intended to replace advice given to you by your health care provider. Make sure you discuss any questions you have with your healthcare provider. Document Revised: 12/30/2019 Document Reviewed: 12/30/2019 Elsevier Patient Education  [place] After This sheet gives you information about how to care for yourself after your procedure. Your health care provider may also give you more specific instructions. If you have problems or questions, contact your health careprovider. What can I expect after the procedure? After the procedure, it is common to have: Tiredness. Forgetfulness about what happened after the procedure. Impaired judgment for important decisions. Nausea or vomiting. Some difficulty with balance. Follow these instructions at home: For the time period you were told by your health care provider:     Rest as needed. Do not participate in activities where you could fall or become injured. Do not drive or use machinery. Do not drink alcohol. Do not take sleeping pills or medicines that cause drowsiness. Do not make important  decisions or sign legal documents. Do not take care of children on your own. Eating and drinking Follow the diet that is recommended by your health care provider. Drink enough fluid to keep your urine pale yellow. If you vomit: Drink water, juice, or soup when you can drink without vomiting. Make sure you have little or no nausea before eating solid foods. General instructions Have a responsible adult stay with you for the time you are told. It is important to have someone help care for you until you are awake and alert. Take over-the-counter and prescription medicines only as told by your health care provider. If you have sleep apnea, surgery and certain medicines can increase your risk for breathing problems. Follow instructions from your health care provider about wearing your sleep device: Anytime you are sleeping, including during daytime naps. While taking prescription pain medicines, sleeping medicines, or medicines that make you drowsy. Avoid smoking. Keep all follow-up visits as told by your health care provider. This is important. Contact a health care provider if: You keep feeling nauseous or you keep vomiting.  You feel light-headed. You are still sleepy or having trouble with balance after 24 hours. You develop a rash. You have a fever. You have redness or swelling around the IV site. Get help right away if: You have trouble breathing. You have new-onset confusion at home. Summary For several hours after your procedure, you may feel tired. You may also be forgetful and have poor judgment. Have a responsible adult stay with you for the time you are told. It is important to have someone help care for you until you are awake and alert. Rest as told. Do not drive or operate machinery. Do not drink alcohol or take sleeping pills. Get help right away if you have trouble breathing, or if you suddenly become confused. This information is not intended to replace advice given to you  by your health care provider. Make sure you discuss any questions you have with your healthcare provider. Document Revised: 04/28/2020 Document Reviewed: 07/16/2019 Elsevier Patient Education  2022 [employer].

## 2021-03-02 ENCOUNTER — Encounter (HOSPITAL_COMMUNITY): Payer: Self-pay

## 2021-03-02 ENCOUNTER — Other Ambulatory Visit: Payer: Self-pay | Admitting: Family Medicine

## 2021-03-02 ENCOUNTER — Other Ambulatory Visit: Payer: Self-pay

## 2021-03-02 ENCOUNTER — Encounter (HOSPITAL_COMMUNITY)
Admission: RE | Admit: 2021-03-02 | Discharge: 2021-03-02 | Disposition: A | Discharge status: Home / Self Care | Payer: 59 | Source: Ambulatory Visit | Attending: Internal Medicine | Admitting: Internal Medicine

## 2021-03-02 ENCOUNTER — Other Ambulatory Visit (HOSPITAL_COMMUNITY): Payer: Self-pay

## 2021-03-02 HISTORY — DX: Nausea with vomiting, unspecified: R11.2

## 2021-03-02 HISTORY — DX: Hypothyroidism, unspecified: E03.9

## 2021-03-02 HISTORY — DX: Other specified postprocedural states: Z98.890

## 2021-03-02 HISTORY — DX: Chronic obstructive pulmonary disease, unspecified: J44.9

## 2021-03-02 MED FILL — Ondansetron Orally Disintegrating Tab 8 MG: ORAL | 10 days supply | Qty: 30 | Fill #0 | Status: AC

## 2021-03-02 NOTE — Progress Notes (Signed)
   03/01/21 0001  Lumbar Exercises: Stretches  Lower Trunk Rotation 5 reps;10 seconds  Lower Trunk Rotation Limitations cued on pain free ROM  Lumbar Exercises: Supine  Ab Set 10 reps  Other Supine Lumbar Exercises decompression exercises 1-5 x 10 each  Manual Therapy  Manual Therapy Soft tissue mobilization  Manual therapy comments Completed separate form all other activity  Soft tissue mobilization IASTM using message gun Lv 10 to lumbar paraspinals patient in sidelying

## 2021-03-02 NOTE — Therapy (Signed)
[facility] [address] [place], [place], [address] Phone: [phone]   Fax:  [phone]  Physical Therapy Treatment  Patient Details  Name: Kathleen [name] MRN: [id number] Date of Birth: 1961/03/09 Referring Provider (PT): Sallee Lange   Encounter Date: 03/01/2021   PT End of Session - 03/01/21 0917     Visit Number 3    Number of Visits 12    Date for PT Re-Evaluation 03/31/21    Authorization Type [employer]/ medicare    Progress Note Due on Visit 10    PT Start Time 0910    PT Stop Time 0948    PT Time Calculation (min) 38 min    Activity Tolerance Patient tolerated treatment well    Behavior During Therapy [facility] for tasks assessed/performed             Past Medical History:  Diagnosis Date   Adrenal insufficiency ([place])    Anemia    Avascular bone necrosis ([place])    Breast fibrocystic disorder    Chronic pain    Encounter for long-term opiate analgesic use 02/02/2020   Patient has chronic pain with femoral head avascular necrosis.  Is under pain contract   Fibromyalgia    Gastroesophageal reflux disease    Mitral valve prolapse    Orthostatic hypotension    Peripheral neuropathy    Restrictive lung disease    Moderate to severe   Sarcoidosis    Biopsy proven - [facility]   SOB (shortness of breath)    chronic    Past Surgical History:  Procedure Laterality Date   BIOPSY  10/17/2018   Procedure: BIOPSY;  Surgeon: Rogene Houston, MD;  Location: [facility];  Service: Endoscopy;;  duodenum, antrum, gastric body   BREAST LUMPECTOMY  01/12/2011   right   ESOPHAGOGASTRODUODENOSCOPY (EGD) WITH PROPOFOL N/A 10/17/2018   Procedure: ESOPHAGOGASTRODUODENOSCOPY (EGD) WITH PROPOFOL;  Surgeon: Rogene Houston, MD;  Location: [facility];  Service: Endoscopy;  Laterality: N/A;  2:25   TUBAL LIGATION      There were no vitals filed for this visit.   Subjective Assessment - 03/01/21 0913     Subjective "Doing the best I  can" with HEP. Back hurts all the time. Feet and hands going numb. Exercises are ok. Message gun was helpful.    Pertinent History Peripheral neuropathy, sarcoidosis, mitral valve prolapse, fibro myalgia    Limitations Reading;Lifting;Sitting;Standing;Walking;House hold activities    How long can you sit comfortably? 10-15 minutes    How long can you stand comfortably? less than five minutes    How long can you walk comfortably? less than five minutes    Patient Stated Goals To get back to somewhat normal,   sleep better  she is waking about every two hours.    Currently in Pain? Yes    Pain Score 8     Pain Location Back    Pain Orientation Posterior;Lower    Pain Descriptors / Indicators Aching    Pain Type Chronic pain    Pain Onset More than a month ago    Pain Frequency Constant               03/01/21 0001  Lumbar Exercises: Stretches  Lower Trunk Rotation 5 reps;10 seconds  Lower Trunk Rotation Limitations cued on pain free ROM  Lumbar Exercises: Supine  Ab Set 10 reps  Other Supine Lumbar Exercises decompression exercises 1-5 x 10 each  Manual  Therapy  Manual Therapy Soft tissue mobilization  Manual therapy comments Completed separate form all other activity  Soft tissue mobilization IASTM using message gun Lv 10 to lumbar paraspinals patient in sidelying       PT Short Term Goals - 02/17/21 1131       PT SHORT TERM GOAL #1   Title Pt to be I in HEP to allow low back pain to be no greater than a 7/10 to improve pt mobility.    Time 3    Period Weeks    Status New    Target Date 03/10/21      PT SHORT TERM GOAL #2   Title Pt to be able to stand for 5-10 minutes in order to make a small meal.    Time 3    Period Weeks    Status New      PT SHORT TERM GOAL #3   Title Pt to be able to verbalize the importance of good sitting/standing posture and biomechanics to reduce back pain    Time 3    Period Weeks    Status New               PT Long Term  Goals - 02/17/21 1133       PT LONG TERM GOAL #1   Title Pt to be I in an advance HEP to allow low back pain to decrease to no greater than a 5/5    Time 6    Period Weeks    Status New    Target Date 03/31/21      PT LONG TERM GOAL #2   Title Pt to be able to walk for 10 minutes in order to be able to get he own mail, complete light household tasks    Time 6    Period Weeks    Status New      PT LONG TERM GOAL #3   Title Pt leg and core strength to improve to allow pt to be able to come sit to stand without the use of her UE and to sit without plopping in her chair    Time 6    Period Weeks    Status New      PT LONG TERM GOAL #4   Title PT to be able to sit for 60 minutes for traveling, eating at leisure    Time 6    Period Weeks                   Plan - 03/02/21 1324     Clinical Impression Statement Continued with core strengthening on mat to patient tolerance. Patient tolerated session fairly well but continues to note some level of discomfort with most all activity. Patient cued on TA activation during bed mobility and transitions to reduce lumbar pain. Performed manual IASTM to address complaint of pain and restrictions. Patient will continue to benefit from skilled therapy services to reduce deficits and improve functional ability.    Personal Factors and Comorbidities Comorbidity 3+    Comorbidities fibromyalgia, peripheral neuropathy, sacoidosis, mitral valve prolapse.    Examination-Activity Limitations Bathing;Bed Mobility;Bend;Caring for Others;Carry;Dressing;Hygiene/Grooming;Lift;Locomotion Level;Reach Overhead;Sit;Sleep;Squat;Stairs;Stand    Examination-Participation Restrictions Church;Cleaning;Community Activity;Driving;Laundry;Meal Prep;Occupation;Shop;Yard Work    Stability/Clinical Decision Making Stable/Uncomplicated    Rehab Potential Good    PT Frequency 2x / week    PT Duration 6 weeks    PT Treatment/Interventions Functional mobility  training;Therapeutic activities;Therapeutic exercise;Cryotherapy;Moist Heat;Balance training;Patient/family education;Manual techniques  PT Next Visit Plan Progress core and posture, manual as needed.    PT Home Exercise Plan ab set both sitting and supine, 1-3 decompression exercises.    Consulted and Agree with Plan of Care Patient             Patient will benefit from skilled therapeutic intervention in order to improve the following deficits and impairments:  Cardiopulmonary status limiting activity, Decreased activity tolerance, Decreased balance, Decreased endurance, Decreased mobility, Decreased range of motion, Decreased strength, Difficulty walking, Hypomobility, Postural dysfunction, Obesity, Pain  Visit Diagnosis: Muscle weakness (generalized)  Chronic midline low back pain with left-sided sciatica     Problem List Patient Active Problem List   Diagnosis Date Noted   Abdominal pain, epigastric 02/21/2021   Esophageal dysphagia 02/21/2021   Weakness of left foot 01/09/2021   Left foot drop 01/09/2021   Cutaneous abscess of axilla 01/09/2021   Pulmonary HTN ([place]) 10/12/2020   Scalp abscess 09/29/2020   Dysuria 09/29/2020   Encounter for long-term opiate analgesic use 02/02/2020   Acute adrenal insufficiency ([place]) 01/27/2020   Interstitial lung disease ([place]) 04/14/2019   Non-intractable vomiting 09/22/2018   Nausea vomiting and diarrhea    Acute adrenal crisis ([place]) 10/15/2017   Gastroesophageal reflux disease 10/15/2017   Personal history of noncompliance with medical treatment, presenting hazards to health 04/26/2017   Hypothyroidism 01/15/2017   Symptomatic bradycardia    Adrenal insufficiency ([place]) 08/17/2016   Hypokalemia 08/17/2016   Chronic pain syndrome 04/20/2016   Vaginal bleeding 01/02/2016   Hypovolemic shock ([place]) 12/30/2015   Nausea and vomiting 12/29/2015   Hypotension 12/29/2015   Constipation 11/14/2015   Avascular necrosis of bone of hip,  left ([place]) 04/05/2015   Bimalleolar fracture of right ankle 02/11/2015   Post herpetic neuralgia 10/22/2014   Hyperlipidemia 08/25/2014   Obstructive sleep apnea 02/18/2014   Obesity, unspecified 05/06/2013   Fibromyalgia 02/11/2013   Depression 02/11/2013   Urinary retention 02/02/2013   Pedal edema 01/22/2013   Orthostatic hypotension 65/78/4696   Helicobacter pylori gastritis 05/23/2012   Fibrocystic breast disease in female 04/09/2012   Anxiety state 10/19/2010   CHEST PAIN 11/22/2008   Sarcoidosis 06/21/2008   9:42 AM, 03/02/21 Josue Hector PT DPT  Physical Therapist with [facility]  [phone] [place], [place], [address] Phone: [phone]   Fax:  [phone]  Name: Kathleen [name] MRN: [id number] Date of Birth: 1960-12-21

## 2021-03-03 ENCOUNTER — Ambulatory Visit (HOSPITAL_COMMUNITY)
Admission: RE | Admit: 2021-03-03 | Discharge: 2021-03-03 | Disposition: A | Discharge status: Home / Self Care | Payer: 59 | Attending: Internal Medicine | Admitting: Internal Medicine

## 2021-03-03 ENCOUNTER — Encounter (HOSPITAL_COMMUNITY)
Admission: RE | Disposition: A | Discharge status: Home / Self Care | Payer: Self-pay | Source: Home / Self Care | Attending: Internal Medicine

## 2021-03-03 ENCOUNTER — Ambulatory Visit (HOSPITAL_COMMUNITY): Payer: 59 | Admitting: Anesthesiology

## 2021-03-03 ENCOUNTER — Other Ambulatory Visit (HOSPITAL_COMMUNITY): Payer: Self-pay

## 2021-03-03 ENCOUNTER — Encounter (HOSPITAL_COMMUNITY): Payer: Self-pay | Admitting: Internal Medicine

## 2021-03-03 DIAGNOSIS — R1013 Epigastric pain: Secondary | ICD-10-CM | POA: Diagnosis not present

## 2021-03-03 DIAGNOSIS — Z79891 Long term (current) use of opiate analgesic: Secondary | ICD-10-CM | POA: Diagnosis not present

## 2021-03-03 DIAGNOSIS — K319 Disease of stomach and duodenum, unspecified: Secondary | ICD-10-CM | POA: Diagnosis not present

## 2021-03-03 DIAGNOSIS — Z79890 Hormone replacement therapy (postmenopausal): Secondary | ICD-10-CM | POA: Diagnosis not present

## 2021-03-03 DIAGNOSIS — K3189 Other diseases of stomach and duodenum: Secondary | ICD-10-CM | POA: Diagnosis not present

## 2021-03-03 DIAGNOSIS — R1314 Dysphagia, pharyngoesophageal phase: Secondary | ICD-10-CM | POA: Diagnosis not present

## 2021-03-03 DIAGNOSIS — F32A Depression, unspecified: Secondary | ICD-10-CM | POA: Insufficient documentation

## 2021-03-03 DIAGNOSIS — Z882 Allergy status to sulfonamides status: Secondary | ICD-10-CM | POA: Insufficient documentation

## 2021-03-03 DIAGNOSIS — R112 Nausea with vomiting, unspecified: Secondary | ICD-10-CM

## 2021-03-03 DIAGNOSIS — K449 Diaphragmatic hernia without obstruction or gangrene: Secondary | ICD-10-CM | POA: Diagnosis not present

## 2021-03-03 DIAGNOSIS — G629 Polyneuropathy, unspecified: Secondary | ICD-10-CM | POA: Diagnosis not present

## 2021-03-03 DIAGNOSIS — K5900 Constipation, unspecified: Secondary | ICD-10-CM

## 2021-03-03 DIAGNOSIS — Z885 Allergy status to narcotic agent status: Secondary | ICD-10-CM | POA: Diagnosis not present

## 2021-03-03 DIAGNOSIS — M797 Fibromyalgia: Secondary | ICD-10-CM | POA: Diagnosis not present

## 2021-03-03 DIAGNOSIS — G8929 Other chronic pain: Secondary | ICD-10-CM | POA: Diagnosis not present

## 2021-03-03 DIAGNOSIS — Z9981 Dependence on supplemental oxygen: Secondary | ICD-10-CM | POA: Insufficient documentation

## 2021-03-03 DIAGNOSIS — Z79899 Other long term (current) drug therapy: Secondary | ICD-10-CM | POA: Diagnosis not present

## 2021-03-03 DIAGNOSIS — K5909 Other constipation: Secondary | ICD-10-CM | POA: Diagnosis not present

## 2021-03-03 DIAGNOSIS — D860 Sarcoidosis of lung: Secondary | ICD-10-CM | POA: Insufficient documentation

## 2021-03-03 DIAGNOSIS — K2970 Gastritis, unspecified, without bleeding: Secondary | ICD-10-CM | POA: Insufficient documentation

## 2021-03-03 DIAGNOSIS — Z91041 Radiographic dye allergy status: Secondary | ICD-10-CM | POA: Diagnosis not present

## 2021-03-03 DIAGNOSIS — J449 Chronic obstructive pulmonary disease, unspecified: Secondary | ICD-10-CM | POA: Diagnosis not present

## 2021-03-03 DIAGNOSIS — R1310 Dysphagia, unspecified: Secondary | ICD-10-CM | POA: Diagnosis not present

## 2021-03-03 HISTORY — PX: BIOPSY: SHX5522

## 2021-03-03 HISTORY — PX: ESOPHAGOGASTRODUODENOSCOPY (EGD) WITH PROPOFOL: SHX5813

## 2021-03-03 HISTORY — PX: ESOPHAGEAL DILATION: SHX303

## 2021-03-03 SURGERY — ESOPHAGOGASTRODUODENOSCOPY (EGD) WITH PROPOFOL
Anesthesia: General

## 2021-03-03 MED ORDER — LEVOTHYROXINE SODIUM 75 MCG PO TABS
ORAL_TABLET | ORAL | 5 refills | Status: DC
  Filled 2021-03-03: qty 30, 30d supply, fill #0
  Filled 2021-04-03: qty 30, 30d supply, fill #1
  Filled 2021-05-02: qty 30, 30d supply, fill #2

## 2021-03-03 MED ORDER — FENTANYL CITRATE (PF) 100 MCG/2ML IJ SOLN
50.0000 ug | INTRAMUSCULAR | Status: AC
  Administered 2021-03-03 (×2): 50 ug via INTRAVENOUS

## 2021-03-03 MED ORDER — LACTATED RINGERS IV SOLN
INTRAVENOUS | Status: DC
  Administered 2021-03-03: 1000 mL via INTRAVENOUS

## 2021-03-03 MED ORDER — ONDANSETRON HCL 4 MG/2ML IJ SOLN
INTRAMUSCULAR | Status: AC
  Filled 2021-03-03: qty 2

## 2021-03-03 MED ORDER — STERILE WATER FOR IRRIGATION IR SOLN
Status: DC | PRN
  Administered 2021-03-03: 1.5 mL

## 2021-03-03 MED ORDER — ONDANSETRON HCL 4 MG/2ML IJ SOLN
INTRAMUSCULAR | Status: AC
  Administered 2021-03-03: 4 mg via INTRAVENOUS
  Filled 2021-03-03: qty 2

## 2021-03-03 MED ORDER — ONDANSETRON HCL 4 MG/2ML IJ SOLN
4.0000 mg | Freq: Once | INTRAMUSCULAR | Status: AC
  Administered 2021-03-03: 4 mg via INTRAVENOUS

## 2021-03-03 MED ORDER — HYDROCORTISONE NA SUCCINATE PF 100 MG IJ SOLR
100.0000 mg | Freq: Once | INTRAMUSCULAR | Status: AC
  Administered 2021-03-03: 100 mg via INTRAVENOUS
  Filled 2021-03-03: qty 2

## 2021-03-03 MED ORDER — LIDOCAINE VISCOUS HCL 2 % MT SOLN
15.0000 mL | Freq: Once | OROMUCOSAL | Status: AC
  Administered 2021-03-03: 15 mL via OROMUCOSAL

## 2021-03-03 MED ORDER — LIDOCAINE VISCOUS HCL 2 % MT SOLN
OROMUCOSAL | Status: AC
  Filled 2021-03-03: qty 15

## 2021-03-03 MED ORDER — ONDANSETRON HCL 4 MG/2ML IJ SOLN: 4.0000 mg | Freq: Once | INTRAMUSCULAR | Status: AC

## 2021-03-03 MED ORDER — LIDOCAINE HCL (CARDIAC) PF 100 MG/5ML IV SOSY
PREFILLED_SYRINGE | INTRAVENOUS | Status: DC | PRN
  Administered 2021-03-03: 50 mg via INTRAVENOUS

## 2021-03-03 MED ORDER — FENTANYL CITRATE (PF) 100 MCG/2ML IJ SOLN
INTRAMUSCULAR | Status: AC
  Filled 2021-03-03: qty 2

## 2021-03-03 MED ORDER — METOCLOPRAMIDE HCL 10 MG PO TABS: 10.0000 mg | ORAL_TABLET | Freq: Three times a day (TID) | ORAL | 0 refills | Status: DC

## 2021-03-03 MED ORDER — PROPOFOL 10 MG/ML IV BOLUS
INTRAVENOUS | Status: DC | PRN
  Administered 2021-03-03: 100 mg via INTRAVENOUS
  Administered 2021-03-03 (×2): 30 mg via INTRAVENOUS
  Administered 2021-03-03: 20 mg via INTRAVENOUS
  Administered 2021-03-03: 40 mg via INTRAVENOUS
  Administered 2021-03-03: 20 mg via INTRAVENOUS

## 2021-03-03 NOTE — Anesthesia Preprocedure Evaluation (Signed)
Anesthesia Evaluation  Patient identified by MRN, date of birth, ID band Patient awake    Reviewed: Allergy & Precautions, NPO status , Patient's Chart, lab work & pertinent test results  History of Anesthesia Complications (+) PONV and history of anesthetic complications  Airway Mallampati: II  TM Distance: >3 FB Neck ROM: Full    Dental  (+) Dental Advisory Given   Pulmonary shortness of breath, with exertion, at rest and Long-Term Oxygen Therapy, sleep apnea , COPD,  oxygen dependent,    Pulmonary exam normal breath sounds clear to auscultation       Cardiovascular Exercise Tolerance: Poor Normal cardiovascular exam+ Valvular Problems/Murmurs MVP  Rhythm:Regular Rate:Normal  Pulmonary HTN   Neuro/Psych PSYCHIATRIC DISORDERS Anxiety Depression  Neuromuscular disease    GI/Hepatic Neg liver ROS, GERD  Medicated,  Endo/Other  Hypothyroidism Adrenal insufficiency   Renal/GU negative Renal ROS     Musculoskeletal  (+) Fibromyalgia -, narcotic dependent  Abdominal   Peds  Hematology  (+) anemia ,   Anesthesia Other Findings Sarcoidosis   Reproductive/Obstetrics                             Anesthesia Physical Anesthesia Plan  ASA: 4  Anesthesia Plan: General   Post-op Pain Management:    Induction:   PONV Risk Score and Plan: Propofol infusion  Airway Management Planned: Nasal Cannula and Natural Airway  Additional Equipment:   Intra-op Plan:   Post-operative Plan:   Informed Consent: I have reviewed the patients History and Physical, chart, labs and discussed the procedure including the risks, benefits and alternatives for the proposed anesthesia with the patient or authorized representative who has indicated his/her understanding and acceptance.     Dental advisory given  Plan Discussed with: CRNA and Surgeon  Anesthesia Plan Comments:         Anesthesia Quick  Evaluation

## 2021-03-03 NOTE — Anesthesia Postprocedure Evaluation (Signed)
Anesthesia Post Note  Patient: Kathleen [name]  Procedure(s) Performed: ESOPHAGOGASTRODUODENOSCOPY (EGD) WITH PROPOFOL ESOPHAGEAL DILATION BIOPSY  Patient location during evaluation: PACU Anesthesia Type: General Level of consciousness: awake and alert and oriented Pain management: pain level controlled Vital Signs Assessment: post-procedure vital signs reviewed and stable Respiratory status: spontaneous breathing, respiratory function stable and patient connected to nasal cannula oxygen Cardiovascular status: blood pressure returned to baseline and stable Postop Assessment: no apparent nausea or vomiting Anesthetic complications: no   No notable events documented.   Last Vitals:  Vitals:   03/03/21 1330 03/03/21 1345  BP: (!) 107/58 (!) 114/56  Pulse: 61 (!) 57  Resp: (!) 23 16  Temp:  36.6 C  SpO2: 93% 94%    Last Pain:  Vitals:   03/03/21 1345  TempSrc: Oral  PainSc: 8                  Shomari Scicchitano C Shravan Salahuddin

## 2021-03-03 NOTE — Interval H&P Note (Signed)
Patient's symptoms have nonchanging since she was seen in the office.  She had abdominal ultrasound was negative for cholelithiasis or dilated bile duct.  He did show fatty liver which would not explain her symptoms. Patient's examination is unchanged.  She remains with tenderness in epigastrium and right upper lower quadrant. Patient is agreeable to proceed with esophagogastroduodenoscopy with esophageal dilation. History and Physical Interval Note:  03/03/2021 12:32 PM  Kathleen [name]  has presented today for surgery, with the diagnosis of Nausea Vomiting constipation epigastric pain.  The various methods of treatment have been discussed with the patient and family. After consideration of risks, benefits and other options for treatment, the patient has consented to  Procedure(s) with comments: ESOPHAGOGASTRODUODENOSCOPY (EGD) WITH PROPOFOL (N/A) - 12:15 ESOPHAGEAL DILATION (N/A) as a surgical intervention.  The patient's history has been reviewed, patient examined, no change in status, stable for surgery.  I have reviewed the patient's chart and labs.  Questions were answered to the patient's satisfaction.     [employer]

## 2021-03-03 NOTE — Transfer of Care (Signed)
Immediate Anesthesia Transfer of Care Note  Patient: Kathleen [name]  Procedure(s) Performed: ESOPHAGOGASTRODUODENOSCOPY (EGD) WITH PROPOFOL ESOPHAGEAL DILATION BIOPSY  Patient Location: PACU  Anesthesia Type:General  Level of Consciousness: drowsy  Airway & Oxygen Therapy: Patient Spontanous Breathing and Patient connected to nasal cannula oxygen  Post-op Assessment: Report given to RN and Post -op Vital signs reviewed and stable  Post vital signs: Reviewed and stable  Last Vitals:  Vitals Value Taken Time  BP    Temp    Pulse 76 03/03/21 1254  Resp 20 03/03/21 1254  SpO2 95 % 03/03/21 1254  Vitals shown include unvalidated device data.  Last Pain:  Vitals:   03/03/21 1237  TempSrc:   PainSc: 8       Patients Stated Pain Goal: 8 (08/65/78 4696)  Complications: No notable events documented.

## 2021-03-03 NOTE — Op Note (Signed)
[facility] Patient Name: Kathleen [name] Procedure Date: 03/03/2021 12:22 PM MRN: [id number] Date of Birth: 10/19/1960 Attending MD: Hildred Laser , MD CSN: [id number] Age: 60 Admit Type: Outpatient Procedure:                Upper GI endoscopy Indications:              Epigastric abdominal pain, Esophageal dysphagia,                            Nausea with vomiting Providers:                Hildred Laser, MD, Caprice Kluver, Raphael Gibney,                            Technician Referring MD:             Elayne Snare. [place] Medicines:                Propofol per Anesthesia Complications:            No immediate complications. Estimated Blood Loss:     Estimated blood loss was minimal. Procedure:                Pre-Anesthesia Assessment:                           - Prior to the procedure, a History and Physical                            was performed, and patient medications and                            allergies were reviewed. The patient's tolerance of                            previous anesthesia was also reviewed. The risks                            and benefits of the procedure and the sedation                            options and risks were discussed with the patient.                            All questions were answered, and informed consent                            was obtained. Prior Anticoagulants: The patient has                            taken no previous anticoagulant or antiplatelet                            agents. ASA Grade Assessment: IV - A patient with  severe systemic disease that is a constant threat                            to life. After reviewing the risks and benefits,                            the patient was deemed in satisfactory condition to                            undergo the procedure.                           After obtaining informed consent, the endoscope was                            passed under direct vision.  Throughout the                            procedure, the patient's blood pressure, pulse, and                            oxygen saturations were monitored continuously. The                            [id number]-H190 ([id number]) scope was introduced through the                            mouth, and advanced to the second part of duodenum.                            The upper GI endoscopy was accomplished without                            difficulty. The patient tolerated the procedure                            well. Scope In: 12:41:19 PM Scope Out: 12:50:10 PM Total Procedure Duration: 0 hours 8 minutes 51 seconds  Findings:      The hypopharynx was normal.      The Z-line was regular and was found 41 cm from the incisors.      A 3 cm hiatal hernia was present.      No endoscopic abnormality was evident in the esophagus to explain the       patient's complaint of dysphagia. It was decided, however, to proceed       with dilation of the entire esophagus. The scope was withdrawn. Dilation       was performed with a Maloney dilator with no resistance at 48 Fr. The       dilation site was examined following endoscope reinsertion and showed no       change and no bleeding, mucosal tear or perforation.      Patchy mild inflammation characterized by congestion (edema), erythema       and granularity was found in the gastric antrum. Biopsies were taken       with a  cold forceps for histology.      The exam of the stomach was otherwise normal.      The duodenal bulb and second portion of the duodenum were normal. Impression:               - Normal hypopharynx.                           - Z-line regular, 41 cm from the incisors.                           - 3 cm hiatal hernia.                           - No endoscopic esophageal abnormality to explain                            patient's dysphagia. Esophagus dilated. Dilated.                           - Gastritis. Biopsied.                           -  Normal duodenal bulb and second portion of the                            duodenum. Moderate Sedation:      Per Anesthesia Care Recommendation:           - Patient has a contact number available for                            emergencies. The signs and symptoms of potential                            delayed complications were discussed with the                            patient. Return to normal activities tomorrow.                            Written discharge instructions were provided to the                            patient.                           - Resume previous diet today.                           - Continue present medications.                           - No aspirin, ibuprofen, naproxen, or other                            non-steroidal anti-inflammatory drugs for 1 day.                           -  Await pathology results.                           - Metoclopramide 10 mg by mouth 30 minutes before                            each meal. 2-week supply given.                           - Schedule HIDA scan with [facility]. Procedure Code(s):        --- Professional ---                           [phone], Esophagogastroduodenoscopy, flexible,                            transoral; with biopsy, single or multiple                           43450, Dilation of esophagus, by unguided sound or                            bougie, single or multiple passes Diagnosis Code(s):        --- Professional ---                           K44.9, Diaphragmatic hernia without obstruction or                            gangrene                           K29.70, Gastritis, unspecified, without bleeding                           R10.13, Epigastric pain                           R13.14, Dysphagia, pharyngoesophageal phase                           R11.2, Nausea with vomiting, unspecified CPT copyright 2019 American Medical Association. All rights reserved. The codes documented in this report are preliminary and  upon coder review may  be revised to meet current compliance requirements. Hildred Laser, MD Hildred Laser, MD 03/03/2021 1:05:34 PM This report has been signed electronically. Number of Addenda: 0

## 2021-03-03 NOTE — Discharge Instructions (Signed)
No aspirin or NSAIDs for 24 hours. Resume usual medications as before. Reglan/metoclopramide 10 mg by mouth 30 minutes before each meal.  If you experience tremors or nervousness or any other side effects of this medication please discontinue it and call office Resume usual diet. No driving for 24 hours. Will schedule HIDA scan with [facility] next week(test to assess gallbladder function) Physician will call with biopsy results.

## 2021-03-06 ENCOUNTER — Other Ambulatory Visit (HOSPITAL_COMMUNITY): Payer: Self-pay

## 2021-03-06 ENCOUNTER — Telehealth (INDEPENDENT_AMBULATORY_CARE_PROVIDER_SITE_OTHER): Payer: Self-pay | Admitting: *Deleted

## 2021-03-06 NOTE — Telephone Encounter (Signed)
Per op note - Schedule HIDA scan with [facility]

## 2021-03-07 ENCOUNTER — Ambulatory Visit (HOSPITAL_COMMUNITY): Payer: 59 | Admitting: Physical Therapy

## 2021-03-07 ENCOUNTER — Other Ambulatory Visit: Payer: Self-pay

## 2021-03-07 ENCOUNTER — Other Ambulatory Visit (INDEPENDENT_AMBULATORY_CARE_PROVIDER_SITE_OTHER): Payer: Self-pay

## 2021-03-07 DIAGNOSIS — G8929 Other chronic pain: Secondary | ICD-10-CM | POA: Diagnosis not present

## 2021-03-07 DIAGNOSIS — M5442 Lumbago with sciatica, left side: Secondary | ICD-10-CM | POA: Diagnosis not present

## 2021-03-07 DIAGNOSIS — R112 Nausea with vomiting, unspecified: Secondary | ICD-10-CM | POA: Diagnosis not present

## 2021-03-07 DIAGNOSIS — M6281 Muscle weakness (generalized): Secondary | ICD-10-CM | POA: Diagnosis not present

## 2021-03-07 LAB — SURGICAL PATHOLOGY

## 2021-03-07 NOTE — Telephone Encounter (Signed)
Noted thanks, patient is scheduled

## 2021-03-07 NOTE — Therapy (Signed)
[place], [place], [address] Phone: [phone]   Fax:  [phone]  Physical Therapy Treatment  Patient Details  Name: Kathleen [name] MRN: [id number] Date of Birth: 15-Aug-1961 Referring Provider (PT): Sallee Lange   Encounter Date: 03/07/2021   PT End of Session - 03/07/21 1000     Visit Number 4    Number of Visits 12    Date for PT Re-Evaluation 03/31/21    Authorization Type [place] [employer]/ medicare    Progress Note Due on Visit 10    PT Start Time 0844    PT Stop Time 0924    PT Time Calculation (min) 40 min    Activity Tolerance Patient tolerated treatment well    Behavior During Therapy [facility] for tasks assessed/performed             Past Medical History:  Diagnosis Date   Adrenal insufficiency ([place])    Anemia    Avascular bone necrosis ([place])    Breast fibrocystic disorder    Chronic pain    COPD (chronic obstructive pulmonary disease) ([place])    Encounter for long-term opiate analgesic use 02/02/2020   Patient has chronic pain with femoral head avascular necrosis.  Is under pain contract   Fibromyalgia    Gastroesophageal reflux disease    Hypothyroidism    Mitral valve prolapse    Orthostatic hypotension    Peripheral neuropathy    PONV (postoperative nausea and vomiting)    Restrictive lung disease    Moderate to severe   Sarcoidosis    Biopsy proven - [facility]   SOB (shortness of breath)    chronic    Past Surgical History:  Procedure Laterality Date   BIOPSY  10/17/2018   Procedure: BIOPSY;  Surgeon: Rogene Houston, MD;  [facility];  Service: Endoscopy;;  duodenum, antrum, gastric body   BREAST LUMPECTOMY Bilateral 01/12/2011   right   ESOPHAGOGASTRODUODENOSCOPY (EGD) WITH PROPOFOL N/A 10/17/2018   Procedure: ESOPHAGOGASTRODUODENOSCOPY (EGD) WITH PROPOFOL;  Surgeon: Rogene Houston, MD;  [facility];  Service: Endoscopy;  Laterality: N/A;  2:25   TUBAL  LIGATION      There were no vitals filed for this visit.   Subjective Assessment - 03/07/21 0845     Subjective Pt admits to not doing much exercise due to ongoing gall bladder issues.  STates she had an endo and scan last week and has to have another one.  STates she has been unable to eat anything due to this.  Current LBP is 9/10 but denies need to go to ED.    Currently in Pain? Yes    Pain Score 9     Pain Location Back    Pain Orientation Lower;Mid    Pain Descriptors / Indicators Aching;Tender;Sore                               [facility] Adult PT Treatment/Exercise - 03/07/21 0001       Lumbar Exercises: Stretches   Lower Trunk Rotation 5 reps;10 seconds    Lower Trunk Rotation Limitations cued on pain free ROM      Lumbar Exercises: Seated   Other Seated Lumbar Exercises abdominal set x 5      Lumbar Exercises: Supine   Ab Set 10 reps    Glut Set 10 reps;3 seconds    Other Supine Lumbar Exercises  decompression exercises 1-5 x 10 each    Other Supine Lumbar Exercises hip abduction isometric with RTB 10X5" holds      Lumbar Exercises: Sidelying   Clam Both;10 reps    Clam Limitations minimal rise and only 2" holds before fatigue      Manual Therapy   Manual Therapy Soft tissue mobilization    Manual therapy comments Completed separate form all other activity    Soft tissue mobilization manual by therapist and IASTM using message gun Lv 10 to lumbar paraspinals patient in sidelying (had to stop due to increased pain from vibration)                      PT Short Term Goals - 02/17/21 1131       PT SHORT TERM GOAL #1   Title Pt to be I in HEP to allow low back pain to be no greater than a 7/10 to improve pt mobility.    Time 3    Period Weeks    Status New    Target Date 03/10/21      PT SHORT TERM GOAL #2   Title Pt to be able to stand for 5-10 minutes in order to make a small meal.    Time 3    Period Weeks    Status New       PT SHORT TERM GOAL #3   Title Pt to be able to verbalize the importance of good sitting/standing posture and biomechanics to reduce back pain    Time 3    Period Weeks    Status New               PT Long Term Goals - 02/17/21 1133       PT LONG TERM GOAL #1   Title Pt to be I in an advance HEP to allow low back pain to decrease to no greater than a 5/5    Time 6    Period Weeks    Status New    Target Date 03/31/21      PT LONG TERM GOAL #2   Title Pt to be able to walk for 10 minutes in order to be able to get he own mail, complete light household tasks    Time 6    Period Weeks    Status New      PT LONG TERM GOAL #3   Title Pt leg and core strength to improve to allow pt to be able to come sit to stand without the use of her UE and to sit without plopping in her chair    Time 6    Period Weeks    Status New      PT LONG TERM GOAL #4   Title PT to be able to sit for 60 minutes for traveling, eating at leisure    Time 6    Period Weeks                   Plan - 03/07/21 9937     Clinical Impression Statement Pt with SOB throughout session, high pain in both abdomen (gall bladder) and LBP.  Pt requires cues to complete activities correctly, specifically with breathing technique and hold times.  many tasks requested pt could not complete due to abdominal pain or general weakness.  Minimal rise with sidelying clams and unable to lift bottom at all with bridge completing only glute sets.  Attempted massage gun  however vibration irritated gallbladder. Manual completeld by therapist to bil LB.  No change or improvements in symptoms voiced at EOS.    Personal Factors and Comorbidities Comorbidity 3+    Comorbidities fibromyalgia, peripheral neuropathy, sacoidosis, mitral valve prolapse.    Examination-Activity Limitations Bathing;Bed Mobility;Bend;Caring for Others;Carry;Dressing;Hygiene/Grooming;Lift;Locomotion Level;Reach Overhead;Sit;Sleep;Squat;Stairs;Stand     Examination-Participation Restrictions Church;Cleaning;Community Activity;Driving;Laundry;Meal Prep;Occupation;Shop;Yard Work    Stability/Clinical Decision Making Stable/Uncomplicated    Rehab Potential Good    PT Frequency 2x / week    PT Duration 6 weeks    PT Treatment/Interventions Functional mobility training;Therapeutic activities;Therapeutic exercise;Cryotherapy;Moist Heat;Balance training;Patient/family education;Manual techniques    PT Next Visit Plan Progress core and posture, manual as needed.    PT Home Exercise Plan ab set both sitting and supine, 1-3 decompression exercises.    Consulted and Agree with Plan of Care Patient             Patient will benefit from skilled therapeutic intervention in order to improve the following deficits and impairments:  Cardiopulmonary status limiting activity, Decreased activity tolerance, Decreased balance, Decreased endurance, Decreased mobility, Decreased range of motion, Decreased strength, Difficulty walking, Hypomobility, Postural dysfunction, Obesity, Pain  Visit Diagnosis: Muscle weakness (generalized)  Chronic midline low back pain with left-sided sciatica     Problem List Patient Active Problem List   Diagnosis Date Noted   Abdominal pain, epigastric 02/21/2021   Esophageal dysphagia 02/21/2021   Weakness of left foot 01/09/2021   Left foot drop 01/09/2021   Cutaneous abscess of axilla 01/09/2021   Pulmonary HTN ([place]) 10/12/2020   Scalp abscess 09/29/2020   Dysuria 09/29/2020   Encounter for long-term opiate analgesic use 02/02/2020   Acute adrenal insufficiency ([place]) 01/27/2020   Interstitial lung disease ([place]) 04/14/2019   Non-intractable vomiting 09/22/2018   Nausea vomiting and diarrhea    Acute adrenal crisis ([place]) 10/15/2017   Gastroesophageal reflux disease 10/15/2017   Personal history of noncompliance with medical treatment, presenting hazards to health 04/26/2017   Hypothyroidism 01/15/2017    Symptomatic bradycardia    Adrenal insufficiency ([place]) 08/17/2016   Hypokalemia 08/17/2016   Chronic pain syndrome 04/20/2016   Vaginal bleeding 01/02/2016   Hypovolemic shock ([place]) 12/30/2015   Nausea and vomiting 12/29/2015   Hypotension 12/29/2015   Constipation 11/14/2015   Avascular necrosis of bone of hip, left ([place]) 04/05/2015   Bimalleolar fracture of right ankle 02/11/2015   Post herpetic neuralgia 10/22/2014   Hyperlipidemia 08/25/2014   Obstructive sleep apnea 02/18/2014   Obesity, unspecified 05/06/2013   Fibromyalgia 02/11/2013   Depression 02/11/2013   Urinary retention 02/02/2013   Pedal edema 01/22/2013   Orthostatic hypotension 00/17/4944   Helicobacter pylori gastritis 05/23/2012   Fibrocystic breast disease in female 04/09/2012   Anxiety state 10/19/2010   CHEST PAIN 11/22/2008   Sarcoidosis 06/21/2008   Teena Irani, PTA/CLT [phone]  Teena Irani 03/07/2021, 10:09 AM  [place] [address] [place], [place], [address] Phone: [phone]   Fax:  [phone]  Name: SHERRIN STAHLE MRN: [id number] Date of Birth: 23-Aug-1961

## 2021-03-08 DIAGNOSIS — R918 Other nonspecific abnormal finding of lung field: Secondary | ICD-10-CM | POA: Diagnosis not present

## 2021-03-08 DIAGNOSIS — M47814 Spondylosis without myelopathy or radiculopathy, thoracic region: Secondary | ICD-10-CM | POA: Diagnosis not present

## 2021-03-08 DIAGNOSIS — R10817 Generalized abdominal tenderness: Secondary | ICD-10-CM | POA: Diagnosis not present

## 2021-03-08 DIAGNOSIS — R079 Chest pain, unspecified: Secondary | ICD-10-CM | POA: Diagnosis not present

## 2021-03-08 DIAGNOSIS — R112 Nausea with vomiting, unspecified: Secondary | ICD-10-CM | POA: Diagnosis not present

## 2021-03-09 ENCOUNTER — Telehealth (HOSPITAL_COMMUNITY): Payer: Self-pay | Admitting: Physical Therapy

## 2021-03-09 ENCOUNTER — Encounter (HOSPITAL_COMMUNITY): Payer: 59 | Admitting: Physical Therapy

## 2021-03-09 NOTE — Telephone Encounter (Signed)
Pt did not show for appt.  Pt called 40 minutes past her appt to inform secretary that she was in ED late last night and was too tired to make it today.   Teena Irani, PTA/CLT [phone]

## 2021-03-10 ENCOUNTER — Encounter (HOSPITAL_COMMUNITY): Payer: Self-pay | Admitting: Internal Medicine

## 2021-03-11 ENCOUNTER — Other Ambulatory Visit (HOSPITAL_COMMUNITY): Payer: Self-pay

## 2021-03-11 ENCOUNTER — Other Ambulatory Visit: Payer: Self-pay | Admitting: Family Medicine

## 2021-03-11 MED FILL — Midodrine HCl Tab 5 MG: ORAL | 30 days supply | Qty: 60 | Fill #1 | Status: AC

## 2021-03-13 ENCOUNTER — Other Ambulatory Visit (HOSPITAL_COMMUNITY): Payer: 59

## 2021-03-14 ENCOUNTER — Ambulatory Visit (HOSPITAL_COMMUNITY): Payer: 59

## 2021-03-14 ENCOUNTER — Other Ambulatory Visit: Payer: Self-pay

## 2021-03-14 ENCOUNTER — Encounter (HOSPITAL_COMMUNITY)
Admission: RE | Admit: 2021-03-14 | Discharge: 2021-03-14 | Disposition: A | Discharge status: Home / Self Care | Payer: 59 | Source: Ambulatory Visit | Attending: Internal Medicine | Admitting: Internal Medicine

## 2021-03-14 ENCOUNTER — Encounter (HOSPITAL_COMMUNITY): Payer: Self-pay

## 2021-03-14 ENCOUNTER — Other Ambulatory Visit (HOSPITAL_COMMUNITY): Payer: Self-pay

## 2021-03-14 DIAGNOSIS — G8929 Other chronic pain: Secondary | ICD-10-CM | POA: Diagnosis not present

## 2021-03-14 DIAGNOSIS — M6281 Muscle weakness (generalized): Secondary | ICD-10-CM

## 2021-03-14 DIAGNOSIS — R112 Nausea with vomiting, unspecified: Secondary | ICD-10-CM

## 2021-03-14 DIAGNOSIS — R109 Unspecified abdominal pain: Secondary | ICD-10-CM | POA: Diagnosis not present

## 2021-03-14 DIAGNOSIS — M5442 Lumbago with sciatica, left side: Secondary | ICD-10-CM | POA: Diagnosis not present

## 2021-03-14 MED ORDER — HYDROXYZINE HCL 25 MG PO TABS
ORAL_TABLET | Freq: Three times a day (TID) | ORAL | 0 refills | Status: DC | PRN
  Filled 2021-03-14: qty 30, 10d supply, fill #0

## 2021-03-14 MED ORDER — TECHNETIUM TC 99M MEBROFENIN IV KIT
5.0000 | PACK | Freq: Once | INTRAVENOUS | Status: AC | PRN
  Administered 2021-03-14: 5.5 via INTRAVENOUS

## 2021-03-14 MED ORDER — STERILE WATER FOR INJECTION IJ SOLN
INTRAMUSCULAR | Status: AC
  Filled 2021-03-14: qty 10

## 2021-03-14 MED ORDER — SODIUM CHLORIDE FLUSH 0.9 % IV SOLN
INTRAVENOUS | Status: AC
  Filled 2021-03-14: qty 30

## 2021-03-14 MED ORDER — SINCALIDE 5 MCG IJ SOLR
INTRAMUSCULAR | Status: AC
  Filled 2021-03-14: qty 5

## 2021-03-14 NOTE — Therapy (Signed)
[place], [place], [address] Phone: [phone]   Fax:  [phone]  Physical Therapy Treatment  Patient Details  Name: Kathleen [name] MRN: [id number] Date of Birth: Apr 19, 1961 Referring Provider (PT): Sallee Lange   Encounter Date: 03/14/2021   PT End of Session - 03/14/21 0913     Visit Number 5    Number of Visits 12    Date for PT Re-Evaluation 03/31/21    Authorization Type [place] [employer]/ medicare    Progress Note Due on Visit 10    PT Start Time 0900    PT Stop Time 0915   pt acutely ill and needing to end session early   PT Time Calculation (min) 15 min    Activity Tolerance Patient tolerated treatment well    Behavior During Therapy [facility] for tasks assessed/performed             Past Medical History:  Diagnosis Date   Adrenal insufficiency ([place])    Anemia    Avascular bone necrosis ([place])    Breast fibrocystic disorder    Chronic pain    COPD (chronic obstructive pulmonary disease) ([place])    Encounter for long-term opiate analgesic use 02/02/2020   Patient has chronic pain with femoral head avascular necrosis.  Is under pain contract   Fibromyalgia    Gastroesophageal reflux disease    Hypothyroidism    Mitral valve prolapse    Orthostatic hypotension    Peripheral neuropathy    PONV (postoperative nausea and vomiting)    Restrictive lung disease    Moderate to severe   Sarcoidosis    Biopsy proven - [facility]   SOB (shortness of breath)    chronic    Past Surgical History:  Procedure Laterality Date   BIOPSY  10/17/2018   Procedure: BIOPSY;  Surgeon: Rogene Houston, MD;  [facility];  Service: Endoscopy;;  duodenum, antrum, gastric body   BIOPSY  03/03/2021   Procedure: BIOPSY;  Surgeon: Rogene Houston, MD;  [facility];  Service: Endoscopy;;   BREAST LUMPECTOMY Bilateral 01/12/2011   right   ESOPHAGEAL DILATION N/A 03/03/2021   Procedure: ESOPHAGEAL DILATION;   Surgeon: Rogene Houston, MD;  [facility];  Service: Endoscopy;  Laterality: N/A;   ESOPHAGOGASTRODUODENOSCOPY (EGD) WITH PROPOFOL N/A 10/17/2018   Procedure: ESOPHAGOGASTRODUODENOSCOPY (EGD) WITH PROPOFOL;  Surgeon: Rogene Houston, MD;  [facility];  Service: Endoscopy;  Laterality: N/A;  2:25   ESOPHAGOGASTRODUODENOSCOPY (EGD) WITH PROPOFOL N/A 03/03/2021   Procedure: ESOPHAGOGASTRODUODENOSCOPY (EGD) WITH PROPOFOL;  Surgeon: Rogene Houston, MD;  [facility];  Service: Endoscopy;  Laterality: N/A;  12:15   TUBAL LIGATION      There were no vitals filed for this visit.   Subjective Assessment - 03/14/21 0900     Subjective Pt notes continued issues with gall bladder causing rib and flank pain and notes continued nausea and inability to eat at this time due to issue of reflux    Pertinent History Peripheral neuropathy, sarcoidosis, mitral valve prolapse, fibro myalgia    Currently in Pain? Yes    Pain Score 10-Worst pain ever    Pain Location --   "all around"   Pain Type Chronic pain  PT Education - 03/14/21 0908     Education Details discussion with pt regarding current presentation and report of 10/10 pain. Pt unable to participate in physical activity due to continued nausea and global pain eminating from right ribs and radiating around her back to periscapular region. Activity limited today to performance of positioning for comfort and education regarding proceeding with current PT POC and recommend f/u with MD due to acute symptoms and present issues    Person(s) Educated Patient    Methods Explanation    Comprehension Verbalized understanding              PT Short Term Goals - 02/17/21 1131       PT SHORT TERM GOAL #1   Title Pt to be I in HEP to allow low back pain to be no greater than a 7/10 to improve pt mobility.    Time 3    Period Weeks    Status New     Target Date 03/10/21      PT SHORT TERM GOAL #2   Title Pt to be able to stand for 5-10 minutes in order to make a small meal.    Time 3    Period Weeks    Status New      PT SHORT TERM GOAL #3   Title Pt to be able to verbalize the importance of good sitting/standing posture and biomechanics to reduce back pain    Time 3    Period Weeks    Status New               PT Long Term Goals - 02/17/21 1133       PT LONG TERM GOAL #1   Title Pt to be I in an advance HEP to allow low back pain to decrease to no greater than a 5/5    Time 6    Period Weeks    Status New    Target Date 03/31/21      PT LONG TERM GOAL #2   Title Pt to be able to walk for 10 minutes in order to be able to get he own mail, complete light household tasks    Time 6    Period Weeks    Status New      PT LONG TERM GOAL #3   Title Pt leg and core strength to improve to allow pt to be able to come sit to stand without the use of her UE and to sit without plopping in her chair    Time 6    Period Weeks    Status New      PT LONG TERM GOAL #4   Title PT to be able to sit for 60 minutes for traveling, eating at leisure    Time 6    Period Weeks                   Plan - 03/14/21 0913     Clinical Impression Statement Pt unable to tolerate physical activity on this date due to 10/10 pain and very pronounced acute pain in her right upper quadrant which radiates to her right scapula/upper back. Vital signs reveal BP of 124/67 mmHg, 76 bpm HR, 94% O2    Personal Factors and Comorbidities Comorbidity 3+    Comorbidities fibromyalgia, peripheral neuropathy, sacoidosis, mitral valve prolapse.    Examination-Activity Limitations Bathing;Bed Mobility;Bend;Caring for Others;Carry;Dressing;Hygiene/Grooming;Lift;Locomotion Level;Reach Overhead;Sit;Sleep;Squat;Stairs;Stand    Examination-Participation Restrictions Church;Cleaning;Community Activity;Driving;Laundry;Meal Prep;Occupation;Shop;Yard Work  Stability/Clinical Decision Making Stable/Uncomplicated    Rehab Potential Good    PT Frequency 2x / week    PT Duration 6 weeks    PT Treatment/Interventions Functional mobility training;Therapeutic activities;Therapeutic exercise;Cryotherapy;Moist Heat;Balance training;Patient/family education;Manual techniques    PT Next Visit Plan Progress core and posture, manual as needed.    PT Home Exercise Plan ab set both sitting and supine, 1-3 decompression exercises.    Consulted and Agree with Plan of Care Patient             Patient will benefit from skilled therapeutic intervention in order to improve the following deficits and impairments:  Cardiopulmonary status limiting activity, Decreased activity tolerance, Decreased balance, Decreased endurance, Decreased mobility, Decreased range of motion, Decreased strength, Difficulty walking, Hypomobility, Postural dysfunction, Obesity, Pain  Visit Diagnosis: Muscle weakness (generalized)  Chronic midline low back pain with left-sided sciatica     Problem List Patient Active Problem List   Diagnosis Date Noted   Abdominal pain, epigastric 02/21/2021   Esophageal dysphagia 02/21/2021   Weakness of left foot 01/09/2021   Left foot drop 01/09/2021   Cutaneous abscess of axilla 01/09/2021   Pulmonary HTN ([place]) 10/12/2020   Scalp abscess 09/29/2020   Dysuria 09/29/2020   Encounter for long-term opiate analgesic use 02/02/2020   Acute adrenal insufficiency ([place]) 01/27/2020   Interstitial lung disease ([place]) 04/14/2019   Non-intractable vomiting 09/22/2018   Nausea vomiting and diarrhea    Acute adrenal crisis ([place]) 10/15/2017   Gastroesophageal reflux disease 10/15/2017   Personal history of noncompliance with medical treatment, presenting hazards to health 04/26/2017   Hypothyroidism 01/15/2017   Symptomatic bradycardia    Adrenal insufficiency ([place]) 08/17/2016   Hypokalemia 08/17/2016   Chronic pain syndrome 04/20/2016   Vaginal  bleeding 01/02/2016   Hypovolemic shock ([place]) 12/30/2015   Nausea and vomiting 12/29/2015   Hypotension 12/29/2015   Constipation 11/14/2015   Avascular necrosis of bone of hip, left ([place]) 04/05/2015   Bimalleolar fracture of right ankle 02/11/2015   Post herpetic neuralgia 10/22/2014   Hyperlipidemia 08/25/2014   Obstructive sleep apnea 02/18/2014   Obesity, unspecified 05/06/2013   Fibromyalgia 02/11/2013   Depression 02/11/2013   Urinary retention 02/02/2013   Pedal edema 01/22/2013   Orthostatic hypotension 35/59/7416   Helicobacter pylori gastritis 05/23/2012   Fibrocystic breast disease in female 04/09/2012   Anxiety state 10/19/2010   CHEST PAIN 11/22/2008   Sarcoidosis 06/21/2008   9:20 AM, 03/14/21 M. Sherlyn Lees, PT, DPT Physical Therapist- [place] Office Number: [phone]   [place] [address] [place], [place], [address] Phone: [phone]   Fax:  [phone]  Name: Kathleen [name] MRN: [id number] Date of Birth: 11/14/1960

## 2021-03-15 ENCOUNTER — Encounter (HOSPITAL_COMMUNITY): Payer: Self-pay | Admitting: Emergency Medicine

## 2021-03-15 ENCOUNTER — Emergency Department (HOSPITAL_COMMUNITY)
Admission: EM | Admit: 2021-03-15 | Discharge: 2021-03-15 | Disposition: A | Discharge status: Home / Self Care | Payer: 59 | Attending: Emergency Medicine | Admitting: Emergency Medicine

## 2021-03-15 ENCOUNTER — Other Ambulatory Visit: Payer: Self-pay

## 2021-03-15 ENCOUNTER — Encounter (HOSPITAL_COMMUNITY): Payer: 59

## 2021-03-15 DIAGNOSIS — R109 Unspecified abdominal pain: Secondary | ICD-10-CM | POA: Diagnosis not present

## 2021-03-15 DIAGNOSIS — E039 Hypothyroidism, unspecified: Secondary | ICD-10-CM | POA: Insufficient documentation

## 2021-03-15 DIAGNOSIS — J449 Chronic obstructive pulmonary disease, unspecified: Secondary | ICD-10-CM | POA: Insufficient documentation

## 2021-03-15 DIAGNOSIS — R112 Nausea with vomiting, unspecified: Secondary | ICD-10-CM | POA: Diagnosis not present

## 2021-03-15 DIAGNOSIS — R1111 Vomiting without nausea: Secondary | ICD-10-CM | POA: Diagnosis not present

## 2021-03-15 DIAGNOSIS — Z79899 Other long term (current) drug therapy: Secondary | ICD-10-CM | POA: Insufficient documentation

## 2021-03-15 DIAGNOSIS — R0902 Hypoxemia: Secondary | ICD-10-CM | POA: Diagnosis not present

## 2021-03-15 DIAGNOSIS — R110 Nausea: Secondary | ICD-10-CM | POA: Diagnosis not present

## 2021-03-15 LAB — COMPREHENSIVE METABOLIC PANEL
ALT: 15 U/L (ref 0–44)
AST: 16 U/L (ref 15–41)
Albumin: 4 g/dL (ref 3.5–5.0)
Alkaline Phosphatase: 60 U/L (ref 38–126)
Anion gap: 10 (ref 5–15)
BUN: 11 mg/dL (ref 6–20)
CO2: 29 mmol/L (ref 22–32)
Calcium: 9.6 mg/dL (ref 8.9–10.3)
Chloride: 100 mmol/L (ref 98–111)
Creatinine, Ser: 1.04 mg/dL — ABNORMAL HIGH (ref 0.44–1.00)
GFR, Estimated: >60 mL/min (ref >60)
Glucose, Bld: 117 mg/dL — ABNORMAL HIGH (ref 70–99)
Potassium: 3.7 mmol/L (ref 3.5–5.1)
Sodium: 139 mmol/L (ref 135–145)
Total Bilirubin: 0.5 mg/dL (ref 0.3–1.2)
Total Protein: 7.2 g/dL (ref 6.5–8.1)

## 2021-03-15 LAB — CBC WITH DIFFERENTIAL/PLATELET
Abs Immature Granulocytes: 0.16 10*3/uL — ABNORMAL HIGH (ref 0.00–0.07)
Basophils Absolute: 0.1 10*3/uL (ref 0.0–0.1)
Basophils Relative: 1 %
Eosinophils Absolute: 0.2 10*3/uL (ref 0.0–0.5)
Eosinophils Relative: 1 %
HCT: 40.8 % (ref 36.0–46.0)
Hemoglobin: 12.8 g/dL (ref 12.0–15.0)
Immature Granulocytes: 1 %
Lymphocytes Relative: 8 %
Lymphs Abs: 1.2 10*3/uL (ref 0.7–4.0)
MCH: 28.8 pg (ref 26.0–34.0)
MCHC: 31.4 g/dL (ref 30.0–36.0)
MCV: 91.9 fL (ref 80.0–100.0)
Monocytes Absolute: 1 10*3/uL (ref 0.1–1.0)
Monocytes Relative: 7 %
Neutro Abs: 12.5 10*3/uL — ABNORMAL HIGH (ref 1.7–7.7)
Neutrophils Relative %: 82 %
Platelets: 380 10*3/uL (ref 150–400)
RBC: 4.44 MIL/uL (ref 3.87–5.11)
RDW: 14.1 % (ref 11.5–15.5)
WBC: 15.2 10*3/uL — ABNORMAL HIGH (ref 4.0–10.5)
nRBC: 0 % (ref 0.0–0.2)

## 2021-03-15 LAB — LIPASE, BLOOD: Lipase: 37 U/L (ref 11–51)

## 2021-03-15 MED ORDER — SODIUM CHLORIDE 0.9 % IV BOLUS
1000.0000 mL | Freq: Once | INTRAVENOUS | Status: AC
Start: 2021-03-15 — End: 2021-03-15
  Administered 2021-03-15: 1000 mL via INTRAVENOUS

## 2021-03-15 MED ORDER — PROMETHAZINE HCL 25 MG RE SUPP
50.0000 mg | Freq: Four times a day (QID) | RECTAL | Status: DC | PRN
  Administered 2021-03-15: 50 mg via RECTAL
  Filled 2021-03-15: qty 2

## 2021-03-15 MED ORDER — METHYLPREDNISOLONE SODIUM SUCC 125 MG IJ SOLR
125.0000 mg | Freq: Once | INTRAMUSCULAR | Status: AC
  Administered 2021-03-15: 125 mg via INTRAVENOUS
  Filled 2021-03-15: qty 2

## 2021-03-15 MED ORDER — ONDANSETRON HCL 4 MG/2ML IJ SOLN
4.0000 mg | Freq: Once | INTRAMUSCULAR | Status: AC
Start: 2021-03-15 — End: 2021-03-15
  Administered 2021-03-15: 4 mg via INTRAVENOUS
  Filled 2021-03-15: qty 2

## 2021-03-15 MED ORDER — HYDROMORPHONE HCL 1 MG/ML IJ SOLN
1.0000 mg | Freq: Once | INTRAMUSCULAR | Status: AC
Start: 2021-03-15 — End: 2021-03-15
  Administered 2021-03-15: 1 mg via INTRAVENOUS
  Filled 2021-03-15: qty 1

## 2021-03-15 MED ORDER — PROMETHAZINE HCL 25 MG RE SUPP: 25.0000 mg | Freq: Four times a day (QID) | RECTAL | 0 refills | Status: DC | PRN

## 2021-03-15 MED ORDER — KETOROLAC TROMETHAMINE 30 MG/ML IJ SOLN
30.0000 mg | Freq: Once | INTRAMUSCULAR | Status: AC
Start: 2021-03-15 — End: 2021-03-15
  Administered 2021-03-15: 30 mg via INTRAVENOUS
  Filled 2021-03-15: qty 1

## 2021-03-15 MED ORDER — HYDROMORPHONE HCL 1 MG/ML IJ SOLN
0.5000 mg | Freq: Once | INTRAMUSCULAR | Status: AC
  Administered 2021-03-15: 0.5 mg via INTRAVENOUS
  Filled 2021-03-15: qty 1

## 2021-03-15 NOTE — Discharge Instructions (Signed)
Dr. Melony Overly has referred you to Dr. Arnoldo Morale.  Call his office for a follow-up appointment.  We are prescribing Phenergan to use for nausea and vomiting instead of the Zofran.

## 2021-03-15 NOTE — ED Provider Notes (Signed)
7:45 AM-checkout from Dr. Stark Jock to evaluate patient after treatment and evaluation.  9:50 AM-labs returned and are reassuring.  Patient currently not vomiting but states she feels nauseated and is having severe abdominal pain.  She states she needs more medications.  She states she has begun to have diarrhea this morning.  1:30 PM-after extensive multiple treatments, the patient's discomfort is finally improved.  I have discussed the situation with her GI doctor.  He recommends continuing current treatment plan of discharge with outpatient follow-up by general surgery as planned.  Patient will be sent home with prescription for Phenergan suppositories after receiving 1 in the emergency department.   Daleen Bo, MD 03/16/21 [phone]

## 2021-03-15 NOTE — ED Triage Notes (Signed)
Pt c/o n/v and dizziness.

## 2021-03-15 NOTE — ED Notes (Signed)
Pt in bed, pt denies significant improvement in nausea, states that she doesn't feel any worse and is ready to go home, d/c pt iv, cath intact, pt verbalized understanding d/c instructions and follow up, advised to return for any concerns, states that her husband is coming to get her, states that she is normally on home O2, but can go without from time to time.

## 2021-03-15 NOTE — ED Notes (Signed)
Pt states she threw up so hard she had a bm on her self. NT gave pt supplies to clean up

## 2021-03-15 NOTE — ED Notes (Signed)
Pt in bed, pt reports decreased pain, pt requests crackers/ po challenge.

## 2021-03-15 NOTE — ED Notes (Signed)
Water and crackers given for po challenge.  

## 2021-03-15 NOTE — ED Provider Notes (Signed)
[facility] EMERGENCY DEPARTMENT Provider Note   CSN: [id number] Arrival date & time: 03/15/21  0531     History Chief Complaint  Patient presents with   Emesis    Kathleen [name] is a 60 y.o. female.  Patient is a 60 year old female with history of COPD, fibromyalgia, adrenal insufficiency, COPD.  Patient with recent, recurrent episodes of nausea, vomiting, and abdominal pain.  She was recently admitted here and underwent endoscopy, HIDA scan and both were unremarkable.  She also had recent admission at [facility] for which no definitive etiology was found.  Patient began vomiting again tonight.  She reports being unable to keep anything down including her steroid.  She denies fevers or chills.  She denies diarrhea or ill contacts.  The history is provided by the patient.  Emesis Severity:  Moderate Duration:  3 hours Timing:  Constant Quality:  Stomach contents Progression:  Worsening Chronicity:  Recurrent Relieved by:  Nothing Worsened by:  Nothing Associated symptoms: abdominal pain   Associated symptoms: no diarrhea and no fever       Past Medical History:  Diagnosis Date   Adrenal insufficiency (HCC)    Anemia    Avascular bone necrosis (HCC)    Breast fibrocystic disorder    Chronic pain    COPD (chronic obstructive pulmonary disease) ([place])    Encounter for long-term opiate analgesic use 02/02/2020   Patient has chronic pain with femoral head avascular necrosis.  Is under pain contract   Fibromyalgia    Gastroesophageal reflux disease    Hypothyroidism    Mitral valve prolapse    Orthostatic hypotension    Peripheral neuropathy    PONV (postoperative nausea and vomiting)    Restrictive lung disease    Moderate to severe   Sarcoidosis    Biopsy proven - [facility]   SOB (shortness of breath)    chronic    Patient Active Problem List   Diagnosis Date Noted   Abdominal pain, epigastric 02/21/2021   Esophageal dysphagia 02/21/2021   Weakness of left foot 01/09/2021    Left foot drop 01/09/2021   Cutaneous abscess of axilla 01/09/2021   Pulmonary HTN ([place]) 10/12/2020   Scalp abscess 09/29/2020   Dysuria 09/29/2020   Encounter for long-term opiate analgesic use 02/02/2020   Acute adrenal insufficiency ([place]) 01/27/2020   Interstitial lung disease ([place]) 04/14/2019   Non-intractable vomiting 09/22/2018   Nausea vomiting and diarrhea    Acute adrenal crisis ([place]) 10/15/2017   Gastroesophageal reflux disease 10/15/2017   Personal history of noncompliance with medical treatment, presenting hazards to health 04/26/2017   Hypothyroidism 01/15/2017   Symptomatic bradycardia    Adrenal insufficiency ([place]) 08/17/2016   Hypokalemia 08/17/2016   Chronic pain syndrome 04/20/2016   Vaginal bleeding 01/02/2016   Hypovolemic shock ([place]) 12/30/2015   Nausea and vomiting 12/29/2015   Hypotension 12/29/2015   Constipation 11/14/2015   Avascular necrosis of bone of hip, left ([place]) 04/05/2015   Bimalleolar fracture of right ankle 02/11/2015   Post herpetic neuralgia 10/22/2014   Hyperlipidemia 08/25/2014   Obstructive sleep apnea 02/18/2014   Obesity, unspecified 05/06/2013   Fibromyalgia 02/11/2013   Depression 02/11/2013   Urinary retention 02/02/2013   Pedal edema 01/22/2013   Orthostatic hypotension 40/05/2724   Helicobacter pylori gastritis 05/23/2012   Fibrocystic breast disease in female 04/09/2012   Anxiety state 10/19/2010   CHEST PAIN 11/22/2008   Sarcoidosis 06/21/2008    Past Surgical History:  Procedure Laterality Date   BIOPSY  10/17/2018  Procedure: BIOPSY;  Surgeon: Rogene Houston, MD;  Location: [facility] ENDO SU[facility];  Service: Endoscopy;;  duodenum, antrum, gastric body   BIOPSY  03/03/2021   Procedure: BIOPSY;  Surgeon: Rogene Houston, MD;  Location: [facility] END[facility];  Service: Endoscopy;;   BREAST LUMPECTOMY Bilateral 01/12/2011   right   ESOPHAGEAL DILATION N/A 03/03/2021   Procedure: ESOPHAGEAL DILATION;  Surgeon: Rogene Houston, MD;   Location: [facility] END[facility];  Service: Endoscopy;  Laterality: N/A;   ESOPHAGOGASTRODUODENOSCOPY (EGD) WITH PROPOFOL N/A 10/17/2018   Procedure: ESOPHAGOGASTRODUODENOSCOPY (EGD) WITH PROPOFOL;  Surgeon: Rogene Houston, MD;  Location: [facility];  Service: Endoscopy;  Laterality: N/A;  2:25   ESOPHAGOGASTRODUODENOSCOPY (EGD) WITH PROPOFOL N/A 03/03/2021   Procedure: ESOPHAGOGASTRODUODENOSCOPY (EGD) WITH PROPOFOL;  Surgeon: Rogene Houston, MD;  Location: [facility];  Service: Endoscopy;  Laterality: N/A;  12:15   TUBAL LIGATION       OB History     Gravida  1   Para  1   Term  1   Preterm      AB      Living         SAB      IAB      Ectopic      Multiple      Live Births              Family History  Problem Relation Age of Onset   Cardiomyopathy Mother    Breast cancer Mother    Prostate cancer Father    Neurofibromatosis Cousin    Heart disease Maternal Aunt    Bipolar disorder Daughter    Colon cancer Neg Hx     Social History   Tobacco Use   Smoking status: Never   Smokeless tobacco: Never  Vaping Use   Vaping Use: Never used  Substance Use Topics   Alcohol use: No   Drug use: No    Home Medications Prior to Admission medications   Medication Sig Start Date End Date Taking? Authorizing Provider  acetaminophen (TYLENOL) 500 MG tablet Take 1,000 mg by mouth every 8 (eight) hours as needed for headache.    [provider]  docusate sodium (COLACE) 100 MG capsule Take 100 mg by mouth 2 (two) times daily.    [provider]  doxycycline (VIBRA-TABS) 100 MG tablet Take 1 tablet (100 mg total) by mouth 2 (two) times daily. Take with a snack and tall glass of liquids 02/20/21   Sallee Lange A, MD  EPINEPHrine 0.3 mg/0.3 mL IJ SOAJ injection Inject 0.3 mLs (0.3 mg total) into the muscle as needed for anaphylaxis. 02/26/20   [name], MD  ferrous sulfate 325 (65 FE) MG EC tablet Take 325 mg by mouth daily with breakfast.     [provider]  FLUoxetine (PROZAC) 20 MG tablet TAKE 1 TABLET BY MOUTH ONCE A DAY 10/14/20 10/14/21  [name], MD  fluticasone (FLONASE) 50 MCG/ACT nasal spray Place 2 sprays into both nostrils daily. 04/30/20   Wurst, [place], PA-C  hydrocortisone (CORTEF) 10 MG tablet Take 1 tablet (10 mg total) by mouth daily with lunch. 02/15/21     hydrocortisone (CORTEF) 20 MG tablet Take 1 tablet (20 mg total) by mouth every morning. 02/15/21     hydrOXYzine (ATARAX/VISTARIL) 25 MG tablet TAKE 1 TABLET BY MOUTH 3 TIMES DAILY AS NEEDED FOR ANXIETY 03/14/21 03/14/22  Elvia Collum M, DO  levothyroxine (SYNTHROID) 75 MCG tablet TAKE  1 TABLET BY MOUTH BEFORE BREAKFAST EVERY DAY EXCEPT TAKE 1/2 TABLET 1 DAY A WEEK 03/03/21 03/03/22  [name], MD  linaclotide (LINZESS) 290 MCG CAPS capsule Take 1 capsule (290 mcg total) by mouth daily before breakfast. 02/21/21   [name], [name], MD  metoCLOPramide (REGLAN) 10 MG tablet Take 1 tablet (10 mg total) by mouth 3 (three) times daily before meals. 03/03/21   [name], [name], MD  midodrine (PROAMATINE) 5 MG tablet TAKE 1 TABLET BY MOUTH TWICE DAILY WITH A MEAL 07/26/20 07/26/21  [name], MD  ondansetron (ZOFRAN-ODT) 8 MG disintegrating tablet TAKE 1 TABLET BY MOUTH EVERY 8 HOURS AS NEEDED FOR NAUSEA & VOMITING 04/12/20 04/12/21  [name], MD  Oxycodone HCl 10 MG TABS TAKE 1 TABLET BY MOUTH EVERY 4 HOURS AS NEEDED FOR PAIN (MAX 6 TABLETS DAILY) **FILL 09/25/20** 02/21/21 08/20/21  [name], MD  Oxycodone HCl 10 MG TABS TAKE 1 TABLET BY MOUTH EVERY 4 HOURS AS NEEDED FOR PAIN. MAX 6 PER DAY. 02/23/21 08/22/21  [name], MD  pantoprazole (PROTONIX) 40 MG tablet TAKE 1 TABLET BY MOUTH ONCE DAILY 09/23/20 09/23/21  [name], MD  predniSONE (DELTASONE) 5 MG tablet TAKE 2 TABLETS BY MOUTH ONCE A DAY 03/28/20 05/31/21  Patrina Levering, MD    Allergies    Bee venom, Bactrim [sulfamethoxazole-trimethoprim], Codeine, Contrast media [iodinated  diagnostic agents], and Morphine and related  Review of Systems   Review of Systems  Constitutional:  Negative for fever.  Gastrointestinal:  Positive for abdominal pain and vomiting. Negative for diarrhea.  All other systems reviewed and are negative.  Physical Exam Updated Vital Signs BP 120/62   Pulse 82   Temp (!) 97.4 F (36.3 C)   Resp (!) 23   Ht 5\' 10"  (1.778 m)   Wt 107 kg   LMP 05/17/2016   SpO2 91%   BMI 33.85 kg/m   Physical Exam Vitals and nursing note reviewed.  Constitutional:      General: She is not in acute distress.    Appearance: She is well-developed. She is not diaphoretic.     Comments: Patient actively retching upon my evaluation.  HENT:     Head: Normocephalic and atraumatic.  Cardiovascular:     Rate and Rhythm: Normal rate and regular rhythm.     Heart sounds: No murmur heard.   No friction rub. No gallop.  Pulmonary:     Effort: Pulmonary effort is normal. No respiratory distress.     Breath sounds: Normal breath sounds. No wheezing.  Abdominal:     General: Bowel sounds are normal. There is no distension.     Palpations: Abdomen is soft.     Tenderness: There is no abdominal tenderness.  Musculoskeletal:        General: Normal range of motion.     Cervical back: Normal range of motion and neck supple.  Skin:    General: Skin is warm and dry.  Neurological:     General: No focal deficit present.     Mental Status: She is alert and oriented to person, place, and time.    ED Results / Procedures / Treatments   Labs (all labs ordered are listed, but only abnormal results are displayed) Labs Reviewed  COMPREHENSIVE METABOLIC PANEL  LIPASE, BLOOD  CBC WITH DIFFERENTIAL/PLATELET    EKG None  Radiology NM Hepato W/EjeCT Fract  Result Date: 03/14/2021 CLINICAL DATA:  Nausea and vomiting, abdominal  pain, symptoms worse with food EXAM: NUCLEAR MEDICINE HEPATOBILIARY IMAGING WITH GALLBLADDER EF TECHNIQUE: Sequential images of the  abdomen were obtained out to 60 minutes following intravenous administration of radiopharmaceutical. After slow intravenous infusion of 2.0 micrograms Cholecystokinin, gallbladder ejection fraction was determined. RADIOPHARMACEUTICALS:  5.5 mCi Tc-49m Choletec IV COMPARISON:  09/03/2018 FINDINGS: Normal tracer extraction from bloodstream indicating normal hepatocellular function. Normal excretion of tracer into biliary tree. Gallbladder visualized at 12 min. Small bowel visualized at 8 min. No hepatic retention of tracer. Subjectively normal emptying of tracer from gallbladder following CCK administration. Calculated gallbladder ejection fraction is 92%, normal. Patient experienced severe abdominal pain with vomiting following CCK administration, leading to premature termination of imaging. Normal gallbladder ejection fraction following CCK stimulation is greater than 40% at 1 hour. IMPRESSION: Patent biliary tree with normal gallbladder ejection fraction of 92%. Severe abdominal pain and vomiting following CCK. Electronically Signed   By: Lavonia Dana M.D.   On: 03/14/2021 12:05    Procedures Procedures   Medications Ordered in ED Medications  sodium chloride 0.9 % bolus 1,000 mL (has no administration in time range)  ondansetron (ZOFRAN) injection 4 mg (has no administration in time range)  HYDROmorphone (DILAUDID) injection 1 mg (has no administration in time range)  ketorolac (TORADOL) 30 MG/ML injection 30 mg (has no administration in time range)    ED Course  I have reviewed the triage vital signs and the nursing notes.  Pertinent labs & imaging results that were available during my care of the patient were reviewed by me and considered in my medical decision making (see chart for details).  Clinical Course as of 03/16/21 0522  Wed Mar 15, 2021  1244 I discussed the case with her GI doctor, Dr. [name].  He states that he has referred the patient to general surgery for evaluation.  He agrees  with discharge, and symptomatic treatment. [EW]    Clinical Course User Index [EW] Daleen Bo, MD   MDM Rules/Calculators/A&P  Patient presenting here with complaints of severe abdominal pain, nausea, and vomiting.  She has episodes similar to this in the past.  She has a history of adrenal insufficiency and has been unable to take her steroids.  Patient arrives here actively vomiting and appears uncomfortable.  IV access was established and patient given medicine for pain and nausea.  Laboratory studies essentially unremarkable.  Care will be signed out to Dr. Eulis Foster at shift change.  He will reassess the patient and determine the final disposition.  Final Clinical Impression(s) / ED Diagnoses Final diagnoses:  None    Rx / DC Orders ED Discharge Orders     None        Veryl Speak, MD 03/16/21 [phone]

## 2021-03-15 NOTE — ED Notes (Signed)
Pt soiled bed, pt cleaned, bed linen changed. Pt educated on using call light when needing to use restroom. Bedside commode placed at bedside.

## 2021-03-16 ENCOUNTER — Encounter (HOSPITAL_COMMUNITY): Payer: 59 | Admitting: Physical Therapy

## 2021-03-16 ENCOUNTER — Other Ambulatory Visit (HOSPITAL_COMMUNITY): Payer: Self-pay

## 2021-03-18 DIAGNOSIS — D860 Sarcoidosis of lung: Secondary | ICD-10-CM | POA: Diagnosis not present

## 2021-03-18 DIAGNOSIS — J849 Interstitial pulmonary disease, unspecified: Secondary | ICD-10-CM | POA: Diagnosis not present

## 2021-03-18 DIAGNOSIS — M6281 Muscle weakness (generalized): Secondary | ICD-10-CM | POA: Diagnosis not present

## 2021-03-18 DIAGNOSIS — J9601 Acute respiratory failure with hypoxia: Secondary | ICD-10-CM | POA: Diagnosis not present

## 2021-03-21 ENCOUNTER — Telehealth (HOSPITAL_COMMUNITY): Payer: Self-pay | Admitting: Physical Therapy

## 2021-03-21 ENCOUNTER — Ambulatory Visit (HOSPITAL_COMMUNITY): Payer: 59 | Admitting: Physical Therapy

## 2021-03-21 DIAGNOSIS — D860 Sarcoidosis of lung: Secondary | ICD-10-CM | POA: Diagnosis not present

## 2021-03-21 DIAGNOSIS — M6281 Muscle weakness (generalized): Secondary | ICD-10-CM | POA: Diagnosis not present

## 2021-03-21 DIAGNOSIS — J9601 Acute respiratory failure with hypoxia: Secondary | ICD-10-CM | POA: Diagnosis not present

## 2021-03-21 DIAGNOSIS — J849 Interstitial pulmonary disease, unspecified: Secondary | ICD-10-CM | POA: Diagnosis not present

## 2021-03-21 NOTE — Telephone Encounter (Signed)
Pt did not show for appointment. Called pt and she reports someone was suppose to call and cancel her appt as she has been having issues with her stomach.  Teena Irani, PTA/CLT [phone]

## 2021-03-22 ENCOUNTER — Other Ambulatory Visit: Payer: Self-pay

## 2021-03-22 ENCOUNTER — Ambulatory Visit: Payer: Self-pay | Admitting: Family Medicine

## 2021-03-22 ENCOUNTER — Ambulatory Visit (INDEPENDENT_AMBULATORY_CARE_PROVIDER_SITE_OTHER): Payer: 59 | Admitting: Family Medicine

## 2021-03-22 ENCOUNTER — Other Ambulatory Visit (HOSPITAL_COMMUNITY): Payer: Self-pay

## 2021-03-22 VITALS — BP 118/72 | Temp 97.8°F | Wt 238.8 lb

## 2021-03-22 DIAGNOSIS — M87052 Idiopathic aseptic necrosis of left femur: Secondary | ICD-10-CM

## 2021-03-22 DIAGNOSIS — Z79891 Long term (current) use of opiate analgesic: Secondary | ICD-10-CM | POA: Diagnosis not present

## 2021-03-22 MED ORDER — HYDROXYZINE HCL 25 MG PO TABS
ORAL_TABLET | Freq: Three times a day (TID) | ORAL | 5 refills | Status: DC | PRN
  Filled 2021-03-22: qty 30, 10d supply, fill #0
  Filled 2021-04-03: qty 30, 10d supply, fill #1
  Filled 2021-04-13: qty 30, 10d supply, fill #2
  Filled 2021-04-20: qty 30, 10d supply, fill #3
  Filled 2021-05-02: qty 30, 10d supply, fill #4
  Filled 2021-05-12: qty 30, 10d supply, fill #5

## 2021-03-22 MED ORDER — OXYCODONE HCL 10 MG PO TABS
ORAL_TABLET | ORAL | 0 refills | Status: DC
  Filled 2021-03-22: qty 180, fill #0

## 2021-03-22 MED ORDER — ONDANSETRON 8 MG PO TBDP
ORAL_TABLET | Freq: Three times a day (TID) | ORAL | 5 refills | Status: DC | PRN
  Filled 2021-03-22: qty 30, 10d supply, fill #0

## 2021-03-22 MED ORDER — FLUOXETINE HCL 20 MG PO TABS
ORAL_TABLET | ORAL | 2 refills | Status: DC
  Filled 2021-03-22: qty 180, 90d supply, fill #0

## 2021-03-22 MED ORDER — PROMETHAZINE HCL 25 MG RE SUPP
25.0000 mg | Freq: Four times a day (QID) | RECTAL | 5 refills | Status: DC | PRN
  Filled 2021-03-22: qty 12, 3d supply, fill #0

## 2021-03-22 MED ORDER — OXYCODONE HCL 10 MG PO TABS
ORAL_TABLET | ORAL | 0 refills | Status: DC
  Filled 2021-03-22: qty 180, fill #0
  Filled 2021-05-23 – 2021-05-24 (×2): qty 180, 30d supply, fill #0

## 2021-03-22 NOTE — Progress Notes (Signed)
Subjective:    Patient ID: Kathleen [name], female    DOB: Jun 21, 1961, 60 y.o.   MRN: [id number]  HPI This patient was seen today for chronic pain  The medication list was reviewed and updated.  Location of Pain for which the patient has been treated with regarding narcotics:   Onset of this pain:    -Compliance with medication: Oxycodone 10 mg  - Number patient states they take daily: 5-6  -when was the last dose patient took? This morning  The patient was advised the importance of maintaining medication and not using illegal substances with these.  Here for refills and follow up  The patient was educated that we can provide 3 monthly scripts for their medication, it is their responsibility to follow the instructions.  Side effects or complications from medications: none  Patient is aware that pain medications are meant to minimize the severity of the pain to allow their pain levels to improve to allow for better function. They are aware of that pain medications cannot totally remove their pain.  Due for UDT ( at least once per year) : last UDT 05/11/20  Scale of 1 to 10 ( 1 is least 10 is most) Your pain level without the medicine: 10 Your pain level with medication: 8  Scale 1 to 10 ( 1-helps very little, 10 helps very well) How well does your pain medication reduce your pain so you can function better through out the day? 5  Quality of the pain:   Persistence of the pain:   Modifying factors:   She has been having ongoing troubles with nausea and intermittent abdominal discomfort she has had a thorough work-up by gastroenterology.  When they did a gastric emptying study they did a CCK and she had severe symptoms with that they are referring her to Dr. Arnoldo Morale for evaluation for possible surgery        Review of Systems     Objective:   Physical Exam Lungs clear heart regular pulse normal extremities no edema skin warm dry       Assessment & Plan:  1.  Avascular necrosis of bone of hip, left [facility]) Patient is on pain medication for this.  She states overall does a decent job of keeping the pain under control that allows her to continue to function  2. Encounter for long-term opiate analgesic use The patient was seen in followup for chronic pain. A review over at their current pain status was discussed. Drug registry was checked. Prescriptions were given.  Regular follow-up recommended. Discussion was held regarding the importance of compliance with medication as well as pain medication contract.  Patient was informed that medication may cause drowsiness and should not be combined  with other medications/alcohol or street drugs. If the patient feels medication is causing altered alertness then do not drive or operate dangerous equipment. We did discuss about the possibility of reducing her pain medicine she does not feel that she can tolerate a lower dose she states that the current dose does well enough to allow her to function  Recently she is seeing gastroenterology for work-up regarding gallbladder When she had gallbladder ejection fraction test done the ejection fraction was good but she got severely sick with CCK therefore GI set her up with Dr. Arnoldo Morale to have surgery  Patient very anxious about surgery because of her underlying lung condition we will send Dr. Arnoldo Morale a message.  Patient has interstitial lung disease.  Followed by Dr.  Launa Flight.  Patient states that Dr. Farley Ly told her that she could do the gallbladder surgery locally.  Patient is worried about complications because of back along with underlying adrenal insufficiency followed by Dr. Dorris Fetch.  Patient also has chronic pain from avascular necrosis of the left hip and is on oxycodone for this.

## 2021-03-23 ENCOUNTER — Other Ambulatory Visit (HOSPITAL_COMMUNITY): Payer: Self-pay

## 2021-03-23 ENCOUNTER — Telehealth: Payer: Self-pay | Admitting: Family Medicine

## 2021-03-23 ENCOUNTER — Encounter (HOSPITAL_COMMUNITY): Payer: 59 | Admitting: Physical Therapy

## 2021-03-23 NOTE — Telephone Encounter (Signed)
Patient had question about her pain medication refills, she states normally she gets them on the first of the month and now they are dated for the end of the month. Please advise Kathleen [name] out-patient pharmacy

## 2021-03-24 ENCOUNTER — Other Ambulatory Visit: Payer: Self-pay | Admitting: Family Medicine

## 2021-03-24 ENCOUNTER — Other Ambulatory Visit (HOSPITAL_COMMUNITY): Payer: Self-pay

## 2021-03-24 MED ORDER — OXYCODONE HCL 10 MG PO TABS
ORAL_TABLET | ORAL | 0 refills | Status: DC
  Filled 2021-03-24: qty 180, 30d supply, fill #0

## 2021-03-24 NOTE — Telephone Encounter (Signed)
Patient advised per Dr Nicki Reaper:  Patient is due for her oxycodone on Sunday But since her pharmacy is closed on Sunday Dr Nicki Reaper sent the rx today so it can be refilled [place]) The other 2 rx for august and oct were already sent in  Patient verbalized understanding.

## 2021-03-24 NOTE — Telephone Encounter (Signed)
She is due for her oxy on Sun But since her pharm is closed on Sunday I sent the rx today so it can be refilled ([facility]) The other 2 rx for august and oct were already sent in thasnks

## 2021-03-28 ENCOUNTER — Encounter (HOSPITAL_COMMUNITY): Payer: 59 | Admitting: Physical Therapy

## 2021-03-30 ENCOUNTER — Other Ambulatory Visit: Payer: Self-pay

## 2021-03-30 ENCOUNTER — Emergency Department (HOSPITAL_COMMUNITY)
Admission: EM | Admit: 2021-03-30 | Discharge: 2021-03-30 | Disposition: A | Discharge status: Home / Self Care | Payer: 59 | Attending: Emergency Medicine | Admitting: Emergency Medicine

## 2021-03-30 ENCOUNTER — Encounter (HOSPITAL_COMMUNITY): Payer: 59 | Admitting: Physical Therapy

## 2021-03-30 DIAGNOSIS — R1011 Right upper quadrant pain: Secondary | ICD-10-CM | POA: Diagnosis not present

## 2021-03-30 DIAGNOSIS — N3000 Acute cystitis without hematuria: Secondary | ICD-10-CM | POA: Diagnosis not present

## 2021-03-30 DIAGNOSIS — I341 Nonrheumatic mitral (valve) prolapse: Secondary | ICD-10-CM | POA: Diagnosis not present

## 2021-03-30 DIAGNOSIS — J449 Chronic obstructive pulmonary disease, unspecified: Secondary | ICD-10-CM | POA: Diagnosis not present

## 2021-03-30 DIAGNOSIS — Z79899 Other long term (current) drug therapy: Secondary | ICD-10-CM | POA: Insufficient documentation

## 2021-03-30 DIAGNOSIS — E039 Hypothyroidism, unspecified: Secondary | ICD-10-CM | POA: Insufficient documentation

## 2021-03-30 DIAGNOSIS — R9431 Abnormal electrocardiogram [ECG] [EKG]: Secondary | ICD-10-CM | POA: Diagnosis not present

## 2021-03-30 LAB — COMPREHENSIVE METABOLIC PANEL
ALT: 16 U/L (ref 0–44)
AST: 17 U/L (ref 15–41)
Albumin: 3.8 g/dL (ref 3.5–5.0)
Alkaline Phosphatase: 65 U/L (ref 38–126)
Anion gap: 5 (ref 5–15)
BUN: 13 mg/dL (ref 6–20)
CO2: 26 mmol/L (ref 22–32)
Calcium: 9.1 mg/dL (ref 8.9–10.3)
Chloride: 107 mmol/L (ref 98–111)
Creatinine, Ser: 0.8 mg/dL (ref 0.44–1.00)
GFR, Estimated: >60 mL/min (ref >60)
Glucose, Bld: 95 mg/dL (ref 70–99)
Potassium: 4 mmol/L (ref 3.5–5.1)
Sodium: 138 mmol/L (ref 135–145)
Total Bilirubin: 0.5 mg/dL (ref 0.3–1.2)
Total Protein: 6.9 g/dL (ref 6.5–8.1)

## 2021-03-30 LAB — URINALYSIS, ROUTINE W REFLEX MICROSCOPIC
Bacteria, UA: NONE SEEN
Bilirubin Urine: NEGATIVE
Glucose, UA: NEGATIVE mg/dL
Hgb urine dipstick: NEGATIVE
Ketones, ur: NEGATIVE mg/dL
Nitrite: NEGATIVE
Protein, ur: NEGATIVE mg/dL
Specific Gravity, Urine: 1.027 (ref 1.005–1.030)
WBC, UA: >50 WBC/hpf — ABNORMAL HIGH (ref 0–5)
pH: 5 (ref 5.0–8.0)

## 2021-03-30 LAB — CBC WITH DIFFERENTIAL/PLATELET
Abs Immature Granulocytes: 0.1 10*3/uL — ABNORMAL HIGH (ref 0.00–0.07)
Basophils Absolute: 0.1 10*3/uL (ref 0.0–0.1)
Basophils Relative: 1 %
Eosinophils Absolute: 0.1 10*3/uL (ref 0.0–0.5)
Eosinophils Relative: 2 %
HCT: 38.7 % (ref 36.0–46.0)
Hemoglobin: 12.1 g/dL (ref 12.0–15.0)
Immature Granulocytes: 1 %
Lymphocytes Relative: 13 %
Lymphs Abs: 1.1 10*3/uL (ref 0.7–4.0)
MCH: 28.7 pg (ref 26.0–34.0)
MCHC: 31.3 g/dL (ref 30.0–36.0)
MCV: 91.7 fL (ref 80.0–100.0)
Monocytes Absolute: 0.5 10*3/uL (ref 0.1–1.0)
Monocytes Relative: 6 %
Neutro Abs: 6.3 10*3/uL (ref 1.7–7.7)
Neutrophils Relative %: 77 %
Platelets: 446 10*3/uL — ABNORMAL HIGH (ref 150–400)
RBC: 4.22 MIL/uL (ref 3.87–5.11)
RDW: 14.5 % (ref 11.5–15.5)
WBC: 8.1 10*3/uL (ref 4.0–10.5)
nRBC: 0 % (ref 0.0–0.2)

## 2021-03-30 LAB — TROPONIN I (HIGH SENSITIVITY)
Troponin I (High Sensitivity): <2 ng/L (ref <18)
Troponin I (High Sensitivity): 2 ng/L (ref <18)

## 2021-03-30 LAB — LIPASE, BLOOD: Lipase: 28 U/L (ref 11–51)

## 2021-03-30 MED ORDER — HYDROMORPHONE HCL 1 MG/ML IJ SOLN
1.0000 mg | Freq: Once | INTRAMUSCULAR | Status: AC
  Administered 2021-03-30: 1 mg via INTRAVENOUS
  Filled 2021-03-30: qty 1

## 2021-03-30 MED ORDER — ONDANSETRON HCL 4 MG/2ML IJ SOLN
4.0000 mg | Freq: Once | INTRAMUSCULAR | Status: AC
  Administered 2021-03-30: 4 mg via INTRAVENOUS
  Filled 2021-03-30: qty 2

## 2021-03-30 MED ORDER — METHYLPREDNISOLONE SODIUM SUCC 125 MG IJ SOLR
125.0000 mg | Freq: Once | INTRAMUSCULAR | Status: AC
  Administered 2021-03-30: 125 mg via INTRAVENOUS
  Filled 2021-03-30: qty 2

## 2021-03-30 MED ORDER — SODIUM CHLORIDE 0.9 % IV BOLUS
1000.0000 mL | Freq: Once | INTRAVENOUS | Status: AC
  Administered 2021-03-30: 1000 mL via INTRAVENOUS

## 2021-03-30 MED ORDER — CEPHALEXIN 500 MG PO CAPS: 500.0000 mg | ORAL_CAPSULE | Freq: Four times a day (QID) | ORAL | 0 refills | Status: DC

## 2021-03-30 MED ORDER — FENTANYL CITRATE (PF) 100 MCG/2ML IJ SOLN
50.0000 ug | Freq: Once | INTRAMUSCULAR | Status: AC
  Administered 2021-03-30: 50 ug via INTRAVENOUS
  Filled 2021-03-30: qty 2

## 2021-03-30 NOTE — ED Provider Notes (Signed)
  Patient signed out to me by Evalee Jefferson, PA-C pending lab results.  Patient here with known history of adrenal insufficiency and on hydrocortisone.  She was recently diagnosed with gallbladder dysfunction and has been referred to Dr. Arnoldo Morale.  She has an appointment on 04/06/2021.  She is here for recurrence of her right upper quadrant pain, nausea, and vomiting.  On my exam, patient well-appearing nontoxic.  No active vomiting reports continued nausea.  She has been given IV fluids Zofran and IV pain medication.  Labs were pending at time of signout.  Labs interpreted by me, show no leukocytosis.  Lipase unremarkable.  No electrolyte derangement.  Transaminases unremarkable.  Troponin reassuring.  EKG without acute ischemic change.  Urinalysis shows large leukocytes with greater than 50 white cells, no bacteria.  On further history, patient does endorse some urinary frequency and burning with urination.  We will treat for UTI and urine culture pending. No CVA tenderness on exam.  No fever, doubt pyelonephritis.   On recheck, patient has tolerated oral fluids.  Reports feeling better after pain medication and antiemetic.  She stated to me that she has has not been able to tolerate her oral hydrocortisone for 2 days.  IV Solu-Medrol given here.  Doubt emergent process.  Patient appears appropriate for discharge home, she will follow-up with Dr. Arnoldo Morale as previously scheduled.   [place]lle, [place], PA-C 03/30/21 [id number]    Daleen Bo, MD 04/02/21 1105

## 2021-03-30 NOTE — Discharge Instructions (Addendum)
Frequent sips of clear fluids this evening.  Bland diet as tolerated tomorrow.  Your urine test this evening also shows that you do have a likely urinary tract infection.  Please take the antibiotic as directed until its finished.  Be sure to keep your appointment with Dr. Arnoldo Morale for next week.  Return emergency department for any new or worsening symptoms.

## 2021-03-30 NOTE — ED Notes (Signed)
pt request more pain medication.  rates pain 10/10.  states previous medication did not help.

## 2021-03-30 NOTE — ED Notes (Signed)
Ambulatory to restroom without assist.  

## 2021-03-30 NOTE — ED Provider Notes (Signed)
[facility] EMERGENCY DEPARTMENT Provider Note   CSN: [id number] Arrival date & time: 03/30/21  1701     History Chief Complaint  Patient presents with   Nausea   Abdominal Pain    Kathleen [name] is a 60 y.o. female with a history as outlined below, most significant for adrenal insufficiency on hydrocortisone, chronic pain syndrome, COPD, GERD and sarcoidosis, recently diagnosed with gallbladder dysfunction per HIDA scan and referred to Dr. Arnoldo Morale of general surgery whom she is scheduled to see on August 11.  She ate lunch around 2 PM today and almost immediately after eating developed recurrence of her right upper quadrant pain.  She describes sharp stabbing pain that radiates up into her chest and through to her right scapular region which she states reminds her of prior gallbladder attacks.  She has had nausea with nonbloody emesis with this pain.  She has had no diarrhea, also denies fevers or chills, she gets short of breath when she is actively vomiting only.  She has had no medications for her symptoms prior to arrival.  The history is provided by the patient.      Past Medical History:  Diagnosis Date   Adrenal insufficiency (HCC)    Anemia    Avascular bone necrosis (HCC)    Breast fibrocystic disorder    Chronic pain    COPD (chronic obstructive pulmonary disease) ([place])    Encounter for long-term opiate analgesic use 02/02/2020   Patient has chronic pain with femoral head avascular necrosis.  Is under pain contract   Fibromyalgia    Gastroesophageal reflux disease    Hypothyroidism    Mitral valve prolapse    Orthostatic hypotension    Peripheral neuropathy    PONV (postoperative nausea and vomiting)    Restrictive lung disease    Moderate to severe   Sarcoidosis    Biopsy proven - [facility]   SOB (shortness of breath)    chronic    Patient Active Problem List   Diagnosis Date Noted   Abdominal pain, epigastric 02/21/2021   Esophageal dysphagia 02/21/2021    Weakness of left foot 01/09/2021   Left foot drop 01/09/2021   Cutaneous abscess of axilla 01/09/2021   Pulmonary HTN ([place]) 10/12/2020   Scalp abscess 09/29/2020   Dysuria 09/29/2020   Encounter for long-term opiate analgesic use 02/02/2020   Acute adrenal insufficiency ([place]) 01/27/2020   Interstitial lung disease ([place]) 04/14/2019   Non-intractable vomiting 09/22/2018   Nausea vomiting and diarrhea    Acute adrenal crisis ([place]) 10/15/2017   Gastroesophageal reflux disease 10/15/2017   Personal history of noncompliance with medical treatment, presenting hazards to health 04/26/2017   Hypothyroidism 01/15/2017   Symptomatic bradycardia    Adrenal insufficiency ([place]) 08/17/2016   Hypokalemia 08/17/2016   Chronic pain syndrome 04/20/2016   Vaginal bleeding 01/02/2016   Hypovolemic shock ([place]) 12/30/2015   Nausea and vomiting 12/29/2015   Hypotension 12/29/2015   Constipation 11/14/2015   Avascular necrosis of bone of hip, left ([place]) 04/05/2015   Bimalleolar fracture of right ankle 02/11/2015   Post herpetic neuralgia 10/22/2014   Hyperlipidemia 08/25/2014   Obstructive sleep apnea 02/18/2014   Obesity, unspecified 05/06/2013   Fibromyalgia 02/11/2013   Depression 02/11/2013   Urinary retention 02/02/2013   Pedal edema 01/22/2013   Orthostatic hypotension 99991111   Helicobacter pylori gastritis 05/23/2012   Fibrocystic breast disease in female 04/09/2012   Anxiety state 10/19/2010   CHEST PAIN 11/22/2008   Sarcoidosis 06/21/2008  Past Surgical History:  Procedure Laterality Date   BIOPSY  10/17/2018   Procedure: BIOPSY;  Surgeon: Rogene Houston, MD;  Location: [facility];  Service: Endoscopy;;  duodenum, antrum, gastric body   BIOPSY  03/03/2021   Procedure: BIOPSY;  Surgeon: Rogene Houston, MD;  Location: [facility];  Service: Endoscopy;;   BREAST LUMPECTOMY Bilateral 01/12/2011   right   ESOPHAGEAL DILATION N/A 03/03/2021   Procedure: ESOPHAGEAL DILATION;   Surgeon: Rogene Houston, MD;  Location: [facility];  Service: Endoscopy;  Laterality: N/A;   ESOPHAGOGASTRODUODENOSCOPY (EGD) WITH PROPOFOL N/A 10/17/2018   Procedure: ESOPHAGOGASTRODUODENOSCOPY (EGD) WITH PROPOFOL;  Surgeon: Rogene Houston, MD;  [facility];  Service: Endoscopy;  Laterality: N/A;  2:25   ESOPHAGOGASTRODUODENOSCOPY (EGD) WITH PROPOFOL N/A 03/03/2021   Procedure: ESOPHAGOGASTRODUODENOSCOPY (EGD) WITH PROPOFOL;  Surgeon: Rogene Houston, MD;  [facility];  Service: Endoscopy;  Laterality: N/A;  12:15   TUBAL LIGATION       OB History     Gravida  1   Para  1   Term  1   Preterm      AB      Living         SAB      IAB      Ectopic      Multiple      Live Births              Family History  Problem Relation Age of Onset   Cardiomyopathy Mother    Breast cancer Mother    Prostate cancer Father    Neurofibromatosis Cousin    Heart disease Maternal Aunt    Bipolar disorder Daughter    Colon cancer Neg Hx     Social History   Tobacco Use   Smoking status: Never   Smokeless tobacco: Never  Vaping Use   Vaping Use: Never used  Substance Use Topics   Alcohol use: No   Drug use: No    Home Medications Prior to Admission medications   Medication Sig Start Date End Date Taking? Authorizing Provider  acetaminophen (TYLENOL) 500 MG tablet Take 1,000 mg by mouth every 8 (eight) hours as needed for headache.    [provider]  docusate sodium (COLACE) 100 MG capsule Take 100 mg by mouth 2 (two) times daily.    [provider]  EPINEPHrine 0.3 mg/0.3 mL IJ SOAJ injection Inject 0.3 mLs (0.3 mg total) into the muscle as needed for anaphylaxis. 02/26/20   [name], MD  ferrous sulfate 325 (65 FE) MG EC tablet Take 325 mg by mouth daily with breakfast.    [provider]  FLUoxetine (PROZAC) 20 MG tablet Take 2 tablets by mouth daily 03/22/21   Luking, Elayne Snare, MD  fluticasone (FLONASE) 50  MCG/ACT nasal spray Place 2 sprays into both nostrils daily. 04/30/20   Wurst, [place], PA-C  hydrocortisone (CORTEF) 10 MG tablet Take 1 tablet (10 mg total) by mouth daily with lunch. 02/15/21     hydrocortisone (CORTEF) 20 MG tablet Take 1 tablet (20 mg total) by mouth every morning. 02/15/21     hydrOXYzine (ATARAX/VISTARIL) 25 MG tablet TAKE 1 TABLET BY MOUTH 3 TIMES DAILY AS NEEDED FOR ANXIETY 03/22/21 03/22/22  [name], MD  levothyroxine (SYNTHROID) 75 MCG tablet TAKE 1 TABLET BY MOUTH BEFORE BREAKFAST EVERY DAY EXCEPT TAKE 1/2 TABLET 1 DAY A WEEK Patient taking differently: Take 75 mcg  by mouth daily before breakfast. 03/03/21 03/03/22  [name], MD  linaclotide [facility]) 290 MCG CAPS capsule Take 1 capsule (290 mcg total) by mouth daily before breakfast. 02/21/21   [name], [name], MD  metoCLOPramide (REGLAN) 10 MG tablet Take 1 tablet (10 mg total) by mouth 3 (three) times daily before meals. 03/03/21   [name], [name], MD  midodrine (PROAMATINE) 5 MG tablet TAKE 1 TABLET BY MOUTH TWICE DAILY WITH A MEAL Patient taking differently: Take 5 mg by mouth 2 (two) times daily with a meal. 07/26/20 07/26/21  Luking, Elayne Snare, MD  ondansetron (ZOFRAN-ODT) 8 MG disintegrating tablet DISSOLVE 1 TABLET BY MOUTH EVERY 8 HOURS AS NEEDED FOR NAUSEA & VOMITING 03/22/21 03/22/22  [name], MD  Oxycodone HCl 10 MG TABS TAKE 1 TABLET BY MOUTH EVERY 4 HOURS AS NEEDED FOR PAIN. MAX 6 PER DAY **04/24/21 03/22/21 09/18/21  [name], MD  Oxycodone HCl 10 MG TABS Take 1 tablet by mouth every 4 hours as needed for pain(max 6 tablets daily)  **05/24/21 03/22/21   [name], MD  Oxycodone HCl 10 MG TABS Take 1 tablet by mouth every 4 hours as needed  max 6 per day 03/24/21   [name], MD  pantoprazole (PROTONIX) 40 MG tablet TAKE 1 TABLET BY MOUTH ONCE DAILY Patient taking differently: Take 40 mg by mouth daily. 09/23/20 09/23/21  [name], MD  predniSONE (DELTASONE) 5 MG tablet TAKE 2  TABLETS BY MOUTH ONCE A DAY Patient taking differently: Take 10 mg by mouth daily. 03/28/20 05/31/21  Patrina Levering, MD  promethazine (PHENERGAN) 25 MG suppository Unwrap and insert 1 suppository (25 mg total) rectally every 6 (six) hours as needed for nausea or vomiting. 03/22/21   [name], MD    Allergies    Bee venom, Bactrim [sulfamethoxazole-trimethoprim], Codeine, Contrast media [iodinated diagnostic agents], and Morphine and related  Review of Systems   Review of Systems  Constitutional:  Negative for chills and fever.  HENT:  Negative for congestion and sore throat.   Eyes: Negative.   Respiratory:  Negative for chest tightness and shortness of breath.   Cardiovascular:  Positive for chest pain.  Gastrointestinal:  Positive for abdominal pain, nausea and vomiting.  Genitourinary: Negative.  Negative for dysuria.  Musculoskeletal:  Negative for arthralgias, joint swelling and neck pain.  Skin: Negative.  Negative for rash and wound.  Neurological:  Negative for dizziness, weakness, light-headedness, numbness and headaches.  Psychiatric/Behavioral: Negative.    All other systems reviewed and are negative.  Physical Exam Updated Vital Signs BP 129/62 (BP Location: Right Arm)   Pulse 73   Temp 97.8 F (36.6 C) (Oral)   Resp 16   Ht '5\' 10"'$  (1.778 m)   Wt 99.8 kg   LMP 05/17/2016   SpO2 95%   BMI 31.57 kg/m   Physical Exam Vitals and nursing note reviewed.  Constitutional:      Appearance: She is well-developed.  HENT:     Head: Normocephalic and atraumatic.  Eyes:     Conjunctiva/sclera: Conjunctivae normal.  Cardiovascular:     Rate and Rhythm: Normal rate and regular rhythm.     Heart sounds: Normal heart sounds.  Pulmonary:     Effort: Pulmonary effort is normal. No respiratory distress.     Breath sounds: Normal breath sounds. No wheezing.  Abdominal:     General: Abdomen is protuberant. Bowel sounds are normal.  Palpations: Abdomen is soft.      Tenderness: There is abdominal tenderness in the right upper quadrant. There is guarding.  Musculoskeletal:        General: Normal range of motion.     Cervical back: Normal range of motion.  Skin:    General: Skin is warm and dry.  Neurological:     Mental Status: She is alert.    ED Results / Procedures / Treatments   Labs (all labs ordered are listed, but only abnormal results are displayed) Labs Reviewed  CBC WITH DIFFERENTIAL/PLATELET  COMPREHENSIVE METABOLIC PANEL  LIPASE, BLOOD  URINALYSIS, ROUTINE W REFLEX MICROSCOPIC  TROPONIN I (HIGH SENSITIVITY)    EKG None  Radiology No results found.  Procedures Procedures   Medications Ordered in ED Medications  sodium chloride 0.9 % bolus 1,000 mL (has no administration in time range)  ondansetron (ZOFRAN) injection 4 mg (has no administration in time range)  HYDROmorphone (DILAUDID) injection 1 mg (has no administration in time range)    ED Course  I have reviewed the triage vital signs and the nursing notes.  Pertinent labs & imaging results that were available during my care of the patient were reviewed by me and considered in my medical decision making (see chart for details).    MDM Rules/Calculators/A&P                           Labs are pending at this time.  Patient was given IV fluids, Zofran and Dilaudid have been ordered as well for symptom relief.  Discussed patient with Kem Parkinson, PA-C who will be assuming patient care.  We will anticipate discharge home assuming labs are stable and symptoms are resolved.  She has follow-up scheduled Dr. Arnoldo Morale in 8 days. Final Clinical Impression(s) / ED Diagnoses Final diagnoses:  Right upper quadrant abdominal pain    Rx / DC Orders ED Discharge Orders     None        Landis Martins 03/30/21 1841    Daleen Bo, MD 04/02/21 1105

## 2021-03-30 NOTE — ED Triage Notes (Signed)
Pt. Is complaining of n/v with chest pain.

## 2021-04-02 LAB — URINE CULTURE

## 2021-04-03 ENCOUNTER — Other Ambulatory Visit (HOSPITAL_COMMUNITY): Payer: Self-pay

## 2021-04-05 DIAGNOSIS — Z6834 Body mass index (BMI) 34.0-34.9, adult: Secondary | ICD-10-CM | POA: Diagnosis not present

## 2021-04-05 DIAGNOSIS — D860 Sarcoidosis of lung: Secondary | ICD-10-CM | POA: Diagnosis not present

## 2021-04-05 DIAGNOSIS — M47812 Spondylosis without myelopathy or radiculopathy, cervical region: Secondary | ICD-10-CM | POA: Diagnosis not present

## 2021-04-05 DIAGNOSIS — M542 Cervicalgia: Secondary | ICD-10-CM | POA: Diagnosis not present

## 2021-04-05 DIAGNOSIS — J8410 Pulmonary fibrosis, unspecified: Secondary | ICD-10-CM | POA: Diagnosis not present

## 2021-04-06 ENCOUNTER — Encounter: Payer: Self-pay | Admitting: General Surgery

## 2021-04-06 ENCOUNTER — Ambulatory Visit (INDEPENDENT_AMBULATORY_CARE_PROVIDER_SITE_OTHER): Payer: 59 | Admitting: General Surgery

## 2021-04-06 ENCOUNTER — Other Ambulatory Visit: Payer: Self-pay

## 2021-04-06 VITALS — BP 123/79 | HR 70 | Temp 97.4°F | Resp 16 | Ht 70.0 in | Wt 240.6 lb

## 2021-04-06 DIAGNOSIS — K8050 Calculus of bile duct without cholangitis or cholecystitis without obstruction: Secondary | ICD-10-CM | POA: Diagnosis not present

## 2021-04-06 NOTE — H&P (Signed)
Kathleen [name]; [id number]; 1961/07/10   HPI Patient is a 60 year old white female who was referred to my care by Dr. Sallee Lange and Dr. [name] of gastroenterology for evaluation treatment of biliary colic.  Patient has had multiple episodes of right upper quadrant abdominal pain, nausea, and vomiting requiring ER visits.  She has been seen by GI and work-up has been remarkable for a HIDA scan which showed an hyperactive gallbladder with reproducible symptoms with CCK injection.  Patient states that multiple different foods bring on an attack.  She ends up having right upper quadrant abdominal pain with radiation to the right flank, bloating, nausea, and heartburn.  She has been tried on multiple medications but she continues to have symptoms.  She denies any fever, chills, or jaundice. Past Medical History:  Diagnosis Date   Adrenal insufficiency (HCC)    Anemia    Avascular bone necrosis (HCC)    Breast fibrocystic disorder    Chronic pain    COPD (chronic obstructive pulmonary disease) ([place])    Encounter for long-term opiate analgesic use 02/02/2020   Patient has chronic pain with femoral head avascular necrosis.  Is under pain contract   Fibromyalgia    Gastroesophageal reflux disease    Hypothyroidism    Mitral valve prolapse    Orthostatic hypotension    Peripheral neuropathy    PONV (postoperative nausea and vomiting)    Restrictive lung disease    Moderate to severe   Sarcoidosis    Biopsy proven - [facility]   SOB (shortness of breath)    chronic    Past Surgical History:  Procedure Laterality Date   BIOPSY  10/17/2018   Procedure: BIOPSY;  Surgeon: Rogene Houston, MD;  [facility];  Service: Endoscopy;;  duodenum, antrum, gastric body   BIOPSY  03/03/2021   Procedure: BIOPSY;  Surgeon: Rogene Houston, MD;  [facility];  Service: Endoscopy;;   BREAST LUMPECTOMY Bilateral 01/12/2011   right   ESOPHAGEAL DILATION N/A 03/03/2021   Procedure: ESOPHAGEAL  DILATION;  Surgeon: Rogene Houston, MD;  [facility];  Service: Endoscopy;  Laterality: N/A;   ESOPHAGOGASTRODUODENOSCOPY (EGD) WITH PROPOFOL N/A 10/17/2018   Procedure: ESOPHAGOGASTRODUODENOSCOPY (EGD) WITH PROPOFOL;  Surgeon: Rogene Houston, MD;  [facility];  Service: Endoscopy;  Laterality: N/A;  2:25   ESOPHAGOGASTRODUODENOSCOPY (EGD) WITH PROPOFOL N/A 03/03/2021   Procedure: ESOPHAGOGASTRODUODENOSCOPY (EGD) WITH PROPOFOL;  Surgeon: Rogene Houston, MD;  [facility];  Service: Endoscopy;  Laterality: N/A;  12:15   TUBAL LIGATION      Family History  Problem Relation Age of Onset   Cardiomyopathy Mother    Breast cancer Mother    Prostate cancer Father    Neurofibromatosis Cousin    Heart disease Maternal Aunt    Bipolar disorder Daughter    Colon cancer Neg Hx     Current Outpatient Medications on File Prior to Visit  Medication Sig Dispense Refill   acetaminophen (TYLENOL) 500 MG tablet Take 1,000 mg by mouth every 8 (eight) hours as needed for headache.     cephALEXin (KEFLEX) 500 MG capsule Take 1 capsule (500 mg total) by mouth 4 (four) times daily. 28 capsule 0   docusate sodium (COLACE) 100 MG capsule Take 100 mg by mouth 2 (two) times daily.     EPINEPHrine 0.3 mg/0.3 mL IJ SOAJ injection Inject 0.3 mLs (0.3 mg total) into the muscle as needed for anaphylaxis. 1 each 2  ferrous sulfate 325 (65 FE) MG EC tablet Take 325 mg by mouth daily with breakfast.     FLUoxetine (PROZAC) 20 MG tablet Take 2 tablets by mouth daily 180 tablet 2   fluticasone (FLONASE) 50 MCG/ACT nasal spray Place 2 sprays into both nostrils daily. 16 g 0   hydrocortisone (CORTEF) 10 MG tablet Take 1 tablet (10 mg total) by mouth daily with lunch. 30 tablet 0   hydrocortisone (CORTEF) 20 MG tablet Take 1 tablet (20 mg total) by mouth every morning. 30 tablet 0   hydrOXYzine (ATARAX/VISTARIL) 25 MG tablet TAKE 1 TABLET BY MOUTH 3 TIMES DAILY AS NEEDED FOR ANXIETY 30  tablet 5   levothyroxine (SYNTHROID) 75 MCG tablet TAKE 1 TABLET BY MOUTH BEFORE BREAKFAST EVERY DAY EXCEPT TAKE 1/2 TABLET 1 DAY A WEEK (Patient taking differently: Take 75 mcg by mouth daily before breakfast.) 30 tablet 5   linaclotide (LINZESS) 290 MCG CAPS capsule Take 1 capsule (290 mcg total) by mouth daily before breakfast. 30 capsule 5   metoCLOPramide (REGLAN) 10 MG tablet Take 1 tablet (10 mg total) by mouth 3 (three) times daily before meals. 42 tablet 0   midodrine (PROAMATINE) 5 MG tablet TAKE 1 TABLET BY MOUTH TWICE DAILY WITH A MEAL (Patient taking differently: Take 5 mg by mouth 2 (two) times daily with a meal.) 60 tablet 5   ondansetron (ZOFRAN-ODT) 8 MG disintegrating tablet DISSOLVE 1 TABLET BY MOUTH EVERY 8 HOURS AS NEEDED FOR NAUSEA & VOMITING 30 tablet 5   Oxycodone HCl 10 MG TABS TAKE 1 TABLET BY MOUTH EVERY 4 HOURS AS NEEDED FOR PAIN. MAX 6 PER DAY **04/24/21 180 tablet 0   Oxycodone HCl 10 MG TABS Take 1 tablet by mouth every 4 hours as needed for pain(max 6 tablets daily)  **05/24/21 180 tablet 0   Oxycodone HCl 10 MG TABS Take 1 tablet by mouth every 4 hours as needed  max 6 per day 180 tablet 0   pantoprazole (PROTONIX) 40 MG tablet TAKE 1 TABLET BY MOUTH ONCE DAILY (Patient taking differently: Take 40 mg by mouth daily.) 90 tablet 1   predniSONE (DELTASONE) 5 MG tablet TAKE 2 TABLETS BY MOUTH ONCE A DAY (Patient taking differently: Take 10 mg by mouth daily.) 180 tablet 3   promethazine (PHENERGAN) 25 MG suppository Unwrap and insert 1 suppository (25 mg total) rectally every 6 (six) hours as needed for nausea or vomiting. 12 each 5   No current facility-administered medications on file prior to visit.    Allergies  Allergen Reactions   Bee Venom Anaphylaxis   Contrast Media [Iodinated Diagnostic Agents]     CT chest w/ contrast at OSH followed by SOB resolved w/ single dose steroids, never ENT swelling, intubation or pressors, has had multiple contrasted scans since    Bactrim [Sulfamethoxazole-Trimethoprim] Nausea And Vomiting   Codeine Itching   Morphine And Related Other (See Comments)    Unknown reaction- pt gets sick, dizzy, and confused Intolerance, not an allergy, received this in the emergency department recently without a problem.    Social History   Substance and Sexual Activity  Alcohol Use No    Social History   Tobacco Use  Smoking Status Never  Smokeless Tobacco Never    Review of Systems  Constitutional:  Positive for malaise/fatigue.  HENT: Negative.    Eyes: Negative.   Respiratory:  Positive for shortness of breath and wheezing.   Cardiovascular: Negative.   Gastrointestinal:  Positive for abdominal  pain, heartburn, nausea and vomiting.  Genitourinary: Negative.   Musculoskeletal:  Positive for back pain, joint pain and neck pain.  Skin: Negative.   Neurological:  Positive for headaches.  Endo/Heme/Allergies: Negative.   Psychiatric/Behavioral:  The patient is nervous/anxious.    Objective   Vitals:   04/06/21 0848  BP: 123/79  Pulse: 70  Resp: 16  Temp: (!) 97.4 F (36.3 C)  SpO2: 91%    Physical Exam Vitals reviewed.  Constitutional:      Appearance: Normal appearance. She is obese. She is not ill-appearing.  HENT:     Head: Normocephalic and atraumatic.  Eyes:     General: No scleral icterus. Cardiovascular:     Rate and Rhythm: Normal rate and regular rhythm.     Heart sounds: Normal heart sounds. No murmur heard.   No friction rub. No gallop.  Pulmonary:     Effort: Pulmonary effort is normal. No respiratory distress.     Breath sounds: Normal breath sounds. No stridor. No wheezing, rhonchi or rales.  Abdominal:     General: Bowel sounds are normal. There is no distension.     Palpations: Abdomen is soft. There is no mass.     Tenderness: There is no abdominal tenderness. There is no guarding or rebound.     Hernia: No hernia is present.     Comments: Slight discomfort to deep palpation in  the right upper quadrant.  No rigidity is noted.  Skin:    General: Skin is warm and dry.  Neurological:     Mental Status: She is alert and oriented to person, place, and time.  EGD notes reviewed Ultrasound of right upper quadrant negative for cholelithiasis. HIDA scan reveals a greater than 90% gallbladder ejection fraction but reproducible symptoms with CCK injection  Assessment  Biliary colic Multiple medical problems including COPD, adrenal insufficiency, morbid obesity, chronic hip pain. In my discussion with the patient, I told her that a cholecystectomy is her next test.  This is all we can offer her at this time.  I told her this should relieve her biliary colic, the extent to which will relieve all of her symptoms is unknown. Plan  Patient is scheduled for laparoscopic cholecystectomy on 04/17/2021.  The risks and benefits of the procedure including bleeding, infection, hepatobiliary injury, cardiopulmonary difficulties, the possibility of an open procedure, and the possibility of recurrence of her symptoms were fully explained to the patient, who gave informed consent.  Due to her multiple comorbidities, she will stay overnight in the hospital.  We will try to limit her fluids due to her pulmonary status.  She will need stress dose steroids.

## 2021-04-06 NOTE — Progress Notes (Signed)
Kathleen [name]; [id number]; [birth year]   HPI Patient is a 59 year old white female who was referred to my care by Dr. Sallee Lange and Dr. [name] of gastroenterology for evaluation treatment of biliary colic.  Patient has had multiple episodes of right upper quadrant abdominal pain, nausea, and vomiting requiring ER visits.  She has been seen by GI and work-up has been remarkable for a HIDA scan which showed an hyperactive gallbladder with reproducible symptoms with CCK injection.  Patient states that multiple different foods bring on an attack.  She ends up having right upper quadrant abdominal pain with radiation to the right flank, bloating, nausea, and heartburn.  She has been tried on multiple medications but she continues to have symptoms.  She denies any fever, chills, or jaundice. Past Medical History:  Diagnosis Date   Adrenal insufficiency (HCC)    Anemia    Avascular bone necrosis (HCC)    Breast fibrocystic disorder    Chronic pain    COPD (chronic obstructive pulmonary disease) ([facility])    Encounter for long-term opiate analgesic use 02/02/2020   Patient has chronic pain with femoral head avascular necrosis.  Is under pain contract   Fibromyalgia    Gastroesophageal reflux disease    Hypothyroidism    Mitral valve prolapse    Orthostatic hypotension    Peripheral neuropathy    PONV (postoperative nausea and vomiting)    Restrictive lung disease    Moderate to severe   Sarcoidosis    Biopsy proven - [facility]   SOB (shortness of breath)    chronic    Past Surgical History:  Procedure Laterality Date   BIOPSY  10/17/2018   Procedure: BIOPSY;  Surgeon: Rogene Houston, MD;  Location: [facility];  Service: Endoscopy;;  duodenum, antrum, gastric body   BIOPSY  03/03/2021   Procedure: BIOPSY;  Surgeon: Rogene Houston, MD;  Location: [facility];  Service: Endoscopy;;   BREAST LUMPECTOMY Bilateral 01/12/2011   right   ESOPHAGEAL DILATION N/A 03/03/2021   Procedure: ESOPHAGEAL  DILATION;  Surgeon: Rogene Houston, MD;  Location: [facility];  Service: Endoscopy;  Laterality: N/A;   ESOPHAGOGASTRODUODENOSCOPY (EGD) WITH PROPOFOL N/A 10/17/2018   Procedure: ESOPHAGOGASTRODUODENOSCOPY (EGD) WITH PROPOFOL;  Surgeon: Rogene Houston, MD;  Location: [facility];  Service: Endoscopy;  Laterality: N/A;  2:25   ESOPHAGOGASTRODUODENOSCOPY (EGD) WITH PROPOFOL N/A 03/03/2021   Procedure: ESOPHAGOGASTRODUODENOSCOPY (EGD) WITH PROPOFOL;  Surgeon: Rogene Houston, MD;  Location: [facility];  Service: Endoscopy;  Laterality: N/A;  12:15   TUBAL LIGATION      Family History  Problem Relation Age of Onset   Cardiomyopathy Mother    Breast cancer Mother    Prostate cancer Father    Neurofibromatosis Cousin    Heart disease Maternal Aunt    Bipolar disorder Daughter    Colon cancer Neg Hx        Allergies  Allergen Reactions   Bee Venom Anaphylaxis   Contrast Media [Iodinated Diagnostic Agents]     CT chest w/ contrast at OSH followed by SOB resolved w/ single dose steroids, never ENT swelling, intubation or pressors, has had multiple contrasted scans since   Bactrim [Sulfamethoxazole-Trimethoprim] Nausea And Vomiting   Codeine Itching   Morphine And Related Other (See Comments)    Unknown reaction- pt gets sick, dizzy, and confused Intolerance, not an allergy, received this in the emergency department recently without a problem.    Social History   Substance  and Sexual Activity  Alcohol Use No    Social History   Tobacco Use  Smoking Status Never  Smokeless Tobacco Never    Review of Systems  Constitutional:  Positive for malaise/fatigue.  HENT: Negative.    Eyes: Negative.   Respiratory:  Positive for shortness of breath and wheezing.   Cardiovascular: Negative.   Gastrointestinal:  Positive for abdominal pain, heartburn, nausea and vomiting.  Genitourinary: Negative.   Musculoskeletal:  Positive for back pain, joint pain and neck pain.  Skin:  Negative.   Neurological:  Positive for headaches.  Endo/Heme/Allergies: Negative.   Psychiatric/Behavioral:  The patient is nervous/anxious.    Objective   Vitals:   04/06/21 0848  BP: 123/79  Pulse: 70  Resp: 16  Temp: (!) 97.4 F (36.3 C)  SpO2: 91%    Physical Exam Vitals reviewed.  Constitutional:      Appearance: Normal appearance. She is obese. She is not ill-appearing.  HENT:     Head: Normocephalic and atraumatic.  Eyes:     General: No scleral icterus. Cardiovascular:     Rate and Rhythm: Normal rate and regular rhythm.     Heart sounds: Normal heart sounds. No murmur heard.   No friction rub. No gallop.  Pulmonary:     Effort: Pulmonary effort is normal. No respiratory distress.     Breath sounds: Normal breath sounds. No stridor. No wheezing, rhonchi or rales.  Abdominal:     General: Bowel sounds are normal. There is no distension.     Palpations: Abdomen is soft. There is no mass.     Tenderness: There is no abdominal tenderness. There is no guarding or rebound.     Hernia: No hernia is present.     Comments: Slight discomfort to deep palpation in the right upper quadrant.  No rigidity is noted.  Skin:    General: Skin is warm and dry.  Neurological:     Mental Status: She is alert and oriented to person, place, and time.  EGD notes reviewed Ultrasound of right upper quadrant negative for cholelithiasis. HIDA scan reveals a greater than 90% gallbladder ejection fraction but reproducible symptoms with CCK injection  Assessment  Biliary colic Multiple medical problems including COPD, adrenal insufficiency, morbid obesity, chronic hip pain. In my discussion with the patient, I told her that a cholecystectomy is her next test.  This is all we can offer her at this time.  I told her this should relieve her biliary colic, the extent to which will relieve all of her symptoms is unknown. Plan  Patient is scheduled for laparoscopic cholecystectomy on 04/17/2021.   The risks and benefits of the procedure including bleeding, infection, hepatobiliary injury, cardiopulmonary difficulties, the possibility of an open procedure, and the possibility of recurrence of her symptoms were fully explained to the patient, who gave informed consent.  Due to her multiple comorbidities, she will stay overnight in the hospital.  We will try to limit her fluids due to her pulmonary status.  She will need stress dose steroids.

## 2021-04-10 NOTE — Patient Instructions (Signed)
Kathleen [name]  04/10/2021     '@[email or web address]'$ @   Your procedure is scheduled on  04/17/2021.   Report to [facility] at  [phone]  A.M.   Call this number if you have problems the morning of surgery:  [phone]   Remember:  Do not eat or drink after midnight.      Take these medicines the morning of surgery with A SIP OF WATER      Prozac, cortef, hydroxyzine, reglan, midodrine, zofran (if needed), oxycodone (if needed), protonix, prednisone.     Do not wear jewelry, make-up or nail polish.  Do not wear lotions, powders, or perfumes, or deodorant.  Do not shave 48 hours prior to surgery.  Men may shave face and neck.  Do not bring valuables to the hospital.  [facility] is not responsible for any belongings or valuables.  Contacts, dentures or bridgework may not be worn into surgery.  Leave your suitcase in the car.  After surgery it may be brought to your room.  For patients admitted to the hospital, discharge time will be determined by your treatment team.  Patients discharged the day of surgery will not be allowed to drive home and must have someone with them  for 24 hours.    Special instructions:   DO NOT smoke tobacco or vape for 24 hours before your procedure.  Please read over the following fact sheets that you were given. Coughing and Deep Breathing, Surgical Site Infection Prevention, Anesthesia Post-op Instructions, and Care and Recovery After Surgery      Minimally Invasive Cholecystectomy, Care After This sheet gives you information about how to care for yourself after your procedure. Your health care provider may also give you more specific instructions. If you have problems or questions, contact your health careprovider. What can I expect after the procedure? After the procedure, it is common to have: Pain at your incision sites. You will be given medicines to control this pain. Mild nausea or vomiting. Bloating and possible shoulder  pain from the gas that was used during the procedure. Follow these instructions at home: Medicines Take over-the-counter and prescription medicines only as told by your health care provider. If you were prescribed an antibiotic medicine, take or use it as told by your health care provider. Do not stop using the antibiotic even if you start to feel better. Ask your health care provider if the medicine prescribed to you: Requires you to avoid driving or using machinery. Can cause constipation. You may need to take these actions to prevent or treat constipation: Drink enough fluid to keep your urine pale yellow. Take over-the-counter or prescription medicines. Eat foods that are high in fiber, such as beans, whole grains, and fresh fruits and vegetables. Limit foods that are high in fat and processed sugars, such as fried or sweet foods. Incision care  Follow instructions from your health care provider about how to take care of your incisions. Make sure you: Wash your hands with soap and water for at least 20 seconds before and after you change your bandage (dressing). If soap and water are not available, use hand sanitizer. Change your dressing as told by your health care provider. Leave stitches (sutures), skin glue, or adhesive strips in place. These skin closures may need to be in place for 2 weeks or longer. If adhesive strip edges start to loosen and curl up, you may trim the loose edges. Do not remove  adhesive strips completely unless your health care provider tells you to do that. Do not take baths, swim, or use a hot tub until your health care provider approves. Ask your health care provider if you may take showers. You may only be allowed to take sponge baths. Check your incision area every day for signs of infection. Check for: More redness, swelling, or pain. Fluid or blood. Warmth. Pus or a bad smell.  Activity Rest as told by your health care provider. Avoid sitting for a long  time without moving. Get up to take short walks every 1-2 hours. This is important to improve blood flow and breathing. Ask for help if you feel weak or unsteady. Do not lift anything that is heavier than 10 lb (4.5 kg), or the limit that you are told, until your health care provider says that it is safe. Do not play contact sports until your health care provider approves. Do not return to work or school until your health care provider approves. Return to your normal activities as told by your health care provider. Ask your health care provider what activities are safe for you. General instructions If you were given a sedative during the procedure, it can affect you for several hours. Do not drive or operate machinery until your health care provider says that it is safe. Keep all follow-up visits as told by your health care provider. This is important. Contact a health care provider if: You develop a rash. You have more redness, swelling, or pain around your incisions. You have fluid or blood coming from your incisions. Your incisions feel warm to the touch. You have pus or a bad smell coming from your incisions. You have a fever. One or more of your incisions breaks open. Get help right away if: You have trouble breathing. You have chest pain. You have increasing pain in your shoulders. You faint or feel dizzy when you stand. You have severe pain in your abdomen. You have nausea or vomiting that lasts for more than one day. You have leg pain. Summary After your procedure, it is common to have pain at the incision sites. You may also have nausea or bloating. Follow your health care provider's instructions about medicine, activity restrictions, and caring for your incision areas. Do not do activities that require a lot of effort. Contact a health care provider if you have a fever or other signs of infection, such as more redness, swelling, or pain around the incisions. Get help right away  if you have chest pain, increasing pain in the shoulders, or trouble breathing. This information is not intended to replace advice given to you by your health care provider. Make sure you discuss any questions you have with your healthcare provider. Document Revised: 05/13/2019 Document Reviewed: 05/13/2019 Elsevier Patient Education  [place] Anesthesia, Adult, Care After This sheet gives you information about how to care for yourself after your procedure. Your health care provider may also give you more specific instructions. If you have problems or questions, contact your health careprovider. What can I expect after the procedure? After the procedure, the following side effects are common: Pain or discomfort at the IV site. Nausea. Vomiting. Sore throat. Trouble concentrating. Feeling cold or chills. Feeling weak or tired. Sleepiness and fatigue. Soreness and body aches. These side effects can affect parts of the body that were not involved in surgery. Follow these instructions at home: For the time period you were told by your health care  provider:  Rest. Do not participate in activities where you could fall or become injured. Do not drive or use machinery. Do not drink alcohol. Do not take sleeping pills or medicines that cause drowsiness. Do not make important decisions or sign legal documents. Do not take care of children on your own.  Eating and drinking Follow any instructions from your health care provider about eating or drinking restrictions. When you feel hungry, start by eating small amounts of foods that are soft and easy to digest (bland), such as toast. Gradually return to your regular diet. Drink enough fluid to keep your urine pale yellow. If you vomit, rehydrate by drinking water, juice, or clear broth. General instructions If you have sleep apnea, surgery and certain medicines can increase your risk for breathing problems. Follow instructions  from your health care provider about wearing your sleep device: Anytime you are sleeping, including during daytime naps. While taking prescription pain medicines, sleeping medicines, or medicines that make you drowsy. Have a responsible adult stay with you for the time you are told. It is important to have someone help care for you until you are awake and alert. Return to your normal activities as told by your health care provider. Ask your health care provider what activities are safe for you. Take over-the-counter and prescription medicines only as told by your health care provider. If you smoke, do not smoke without supervision. Keep all follow-up visits as told by your health care provider. This is important. Contact a health care provider if: You have nausea or vomiting that does not get better with medicine. You cannot eat or drink without vomiting. You have pain that does not get better with medicine. You are unable to pass urine. You develop a skin rash. You have a fever. You have redness around your IV site that gets worse. Get help right away if: You have difficulty breathing. You have chest pain. You have blood in your urine or stool, or you vomit blood. Summary After the procedure, it is common to have a sore throat or nausea. It is also common to feel tired. Have a responsible adult stay with you for the time you are told. It is important to have someone help care for you until you are awake and alert. When you feel hungry, start by eating small amounts of foods that are soft and easy to digest (bland), such as toast. Gradually return to your regular diet. Drink enough fluid to keep your urine pale yellow. Return to your normal activities as told by your health care provider. Ask your health care provider what activities are safe for you. This information is not intended to replace advice given to you by your health care provider. Make sure you discuss any questions you have  with your healthcare provider. Document Revised: 04/28/2020 Document Reviewed: 11/26/2019 Elsevier Patient Education  2022 [place]. How to Use Chlorhexidine for Bathing Chlorhexidine gluconate (CHG) is a germ-killing (antiseptic) solution that is used to clean the skin. It can get rid of the bacteria that normally live on the skin and can keep them away for about 24 hours. To clean your skin with CHG, you may be given: A CHG solution to use in the shower or as part of a sponge bath. A prepackaged cloth that contains CHG. Cleaning your skin with CHG may help lower the risk for infection: While you are staying in the intensive care unit of the hospital. If you have a vascular access, such as  a central line, to provide short-term or long-term access to your veins. If you have a catheter to drain urine from your bladder. If you are on a ventilator. A ventilator is a machine that helps you breathe by moving air in and out of your lungs. After surgery. What are the risks? Risks of using CHG include: A skin reaction. Hearing loss, if CHG gets in your ears. Eye injury, if CHG gets in your eyes and is not rinsed out. The CHG product catching fire. Make sure that you avoid smoking and flames after applying CHG to your skin. Do not use CHG: If you have a chlorhexidine allergy or have previously reacted to chlorhexidine. On babies younger than 83 months of age. How to use CHG solution Use CHG only as told by your health care provider, and follow the instructions on the label. Use the full amount of CHG as directed. Usually, this is one bottle. During a shower Follow these steps when using CHG solution during a shower (unless your health care provider gives you different instructions): Start the shower. Use your normal soap and shampoo to wash your face and hair. Turn off the shower or move out of the shower stream. Pour the CHG onto a clean washcloth. Do not use any type of brush or  rough-edged sponge. Starting at your neck, lather your body down to your toes. Make sure you follow these instructions: If you will be having surgery, pay special attention to the part of your body where you will be having surgery. Scrub this area for at least 1 minute. Do not use CHG on your head or face. If the solution gets into your ears or eyes, rinse them well with water. Avoid your genital area. Avoid any areas of skin that have broken skin, cuts, or scrapes. Scrub your back and under your arms. Make sure to wash skin folds. Let the lather sit on your skin for 1-2 minutes or as long as told by your health care provider. Thoroughly rinse your entire body in the shower. Make sure that all body creases and crevices are rinsed well. Dry off with a clean towel. Do not put any substances on your body afterward--such as powder, lotion, or perfume--unless you are told to do so by your health care provider. Only use lotions that are recommended by the manufacturer. Put on clean clothes or pajamas. If it is the night before your surgery, sleep in clean sheets.  During a sponge bath Follow these steps when using CHG solution during a sponge bath (unless your health care provider gives you different instructions): Use your normal soap and shampoo to wash your face and hair. Pour the CHG onto a clean washcloth. Starting at your neck, lather your body down to your toes. Make sure you follow these instructions: If you will be having surgery, pay special attention to the part of your body where you will be having surgery. Scrub this area for at least 1 minute. Do not use CHG on your head or face. If the solution gets into your ears or eyes, rinse them well with water. Avoid your genital area. Avoid any areas of skin that have broken skin, cuts, or scrapes. Scrub your back and under your arms. Make sure to wash skin folds. Let the lather sit on your skin for 1-2 minutes or as long as told by your health  care provider. Using a different clean, wet washcloth, thoroughly rinse your entire body. Make sure that all body  creases and crevices are rinsed well. Dry off with a clean towel. Do not put any substances on your body afterward--such as powder, lotion, or perfume--unless you are told to do so by your health care provider. Only use lotions that are recommended by the manufacturer. Put on clean clothes or pajamas. If it is the night before your surgery, sleep in clean sheets. How to use CHG prepackaged cloths Only use CHG cloths as told by your health care provider, and follow the instructions on the label. Use the CHG cloth on clean, dry skin. Do not use the CHG cloth on your head or face unless your health care provider tells you to. When washing with the CHG cloth: Avoid your genital area. Avoid any areas of skin that have broken skin, cuts, or scrapes. Before surgery Follow these steps when using a CHG cloth to clean before surgery (unless your health care provider gives you different instructions): Using the CHG cloth, vigorously scrub the part of your body where you will be having surgery. Scrub using a back-and-forth motion for 3 minutes. The area on your body should be completely wet with CHG when you are done scrubbing. Do not rinse. Discard the cloth and let the area air-dry. Do not put any substances on the area afterward, such as powder, lotion, or perfume. Put on clean clothes or pajamas. If it is the night before your surgery, sleep in clean sheets.  For general bathing Follow these steps when using CHG cloths for general bathing (unless your health care provider gives you different instructions). Use a separate CHG cloth for each area of your body. Make sure you wash between any folds of skin and between your fingers and toes. Wash your body in the following order, switching to a new cloth after each step: The front of your neck, shoulders, and chest. Both of your arms, under  your arms, and your hands. Your stomach and groin area, avoiding the genitals. Your right leg and foot. Your left leg and foot. The back of your neck, your back, and your buttocks. Do not rinse. Discard the cloth and let the area air-dry. Do not put any substances on your body afterward--such as powder, lotion, or perfume--unless you are told to do so by your health care provider. Only use lotions that are recommended by the manufacturer. Put on clean clothes or pajamas. Contact a health care provider if: Your skin gets irritated after scrubbing. You have questions about using your solution or cloth. Get help right away if: Your eyes become very red or swollen. Your eyes itch badly. Your skin itches badly and is red or swollen. Your hearing changes. You have trouble seeing. You have swelling or tingling in your mouth or throat. You have trouble breathing. You swallow any chlorhexidine. Summary Chlorhexidine gluconate (CHG) is a germ-killing (antiseptic) solution that is used to clean the skin. Cleaning your skin with CHG may help to lower your risk for infection. You may be given CHG to use for bathing. It may be in a bottle or in a prepackaged cloth to use on your skin. Carefully follow your health care provider's instructions and the instructions on the product label. Do not use CHG if you have a chlorhexidine allergy. Contact your health care provider if your skin gets irritated after scrubbing. This information is not intended to replace advice given to you by your health care provider. Make sure you discuss any questions you have with your healthcare provider. Document Revised: 12/25/2019 Document  Reviewed: 01/29/2020 Elsevier Patient Education  2022 [employer].

## 2021-04-13 ENCOUNTER — Other Ambulatory Visit (HOSPITAL_COMMUNITY): Payer: Self-pay

## 2021-04-14 ENCOUNTER — Other Ambulatory Visit: Payer: Self-pay

## 2021-04-14 ENCOUNTER — Encounter (HOSPITAL_COMMUNITY)
Admission: RE | Admit: 2021-04-14 | Discharge: 2021-04-14 | Disposition: A | Discharge status: Home / Self Care | Payer: 59 | Source: Ambulatory Visit | Attending: General Surgery | Admitting: General Surgery

## 2021-04-14 ENCOUNTER — Encounter (HOSPITAL_COMMUNITY): Payer: Self-pay

## 2021-04-14 HISTORY — DX: Anxiety disorder, unspecified: F41.9

## 2021-04-14 MED ORDER — METHYLPREDNISOLONE SODIUM SUCC 125 MG IJ SOLR: 125.0000 mg | Freq: Once | INTRAMUSCULAR | Status: DC

## 2021-04-17 ENCOUNTER — Encounter (HOSPITAL_COMMUNITY)
Admission: RE | Disposition: A | Discharge status: Home / Self Care | Payer: Self-pay | Source: Home / Self Care | Attending: General Surgery

## 2021-04-17 ENCOUNTER — Encounter (HOSPITAL_COMMUNITY): Payer: Self-pay | Admitting: General Surgery

## 2021-04-17 ENCOUNTER — Ambulatory Visit (HOSPITAL_COMMUNITY): Payer: 59 | Admitting: Anesthesiology

## 2021-04-17 ENCOUNTER — Ambulatory Visit (HOSPITAL_COMMUNITY)
Admission: RE | Admit: 2021-04-17 | Discharge: 2021-04-17 | Disposition: A | Discharge status: Home / Self Care | Payer: 59 | Attending: General Surgery | Admitting: General Surgery

## 2021-04-17 DIAGNOSIS — K811 Chronic cholecystitis: Secondary | ICD-10-CM | POA: Diagnosis not present

## 2021-04-17 DIAGNOSIS — Z20822 Contact with and (suspected) exposure to covid-19: Secondary | ICD-10-CM | POA: Insufficient documentation

## 2021-04-17 DIAGNOSIS — Z818 Family history of other mental and behavioral disorders: Secondary | ICD-10-CM | POA: Insufficient documentation

## 2021-04-17 DIAGNOSIS — Z79891 Long term (current) use of opiate analgesic: Secondary | ICD-10-CM | POA: Insufficient documentation

## 2021-04-17 DIAGNOSIS — Z803 Family history of malignant neoplasm of breast: Secondary | ICD-10-CM | POA: Insufficient documentation

## 2021-04-17 DIAGNOSIS — J449 Chronic obstructive pulmonary disease, unspecified: Secondary | ICD-10-CM | POA: Diagnosis not present

## 2021-04-17 DIAGNOSIS — K8050 Calculus of bile duct without cholangitis or cholecystitis without obstruction: Secondary | ICD-10-CM | POA: Diagnosis not present

## 2021-04-17 DIAGNOSIS — Z8269 Family history of other diseases of the musculoskeletal system and connective tissue: Secondary | ICD-10-CM | POA: Insufficient documentation

## 2021-04-17 DIAGNOSIS — Z7952 Long term (current) use of systemic steroids: Secondary | ICD-10-CM | POA: Diagnosis not present

## 2021-04-17 DIAGNOSIS — Z888 Allergy status to other drugs, medicaments and biological substances status: Secondary | ICD-10-CM | POA: Diagnosis not present

## 2021-04-17 DIAGNOSIS — Z91041 Radiographic dye allergy status: Secondary | ICD-10-CM | POA: Diagnosis not present

## 2021-04-17 DIAGNOSIS — Z91030 Bee allergy status: Secondary | ICD-10-CM | POA: Insufficient documentation

## 2021-04-17 DIAGNOSIS — Z79899 Other long term (current) drug therapy: Secondary | ICD-10-CM | POA: Insufficient documentation

## 2021-04-17 DIAGNOSIS — K8020 Calculus of gallbladder without cholecystitis without obstruction: Secondary | ICD-10-CM | POA: Diagnosis present

## 2021-04-17 DIAGNOSIS — Z885 Allergy status to narcotic agent status: Secondary | ICD-10-CM | POA: Insufficient documentation

## 2021-04-17 DIAGNOSIS — Z6834 Body mass index (BMI) 34.0-34.9, adult: Secondary | ICD-10-CM | POA: Insufficient documentation

## 2021-04-17 DIAGNOSIS — Z8042 Family history of malignant neoplasm of prostate: Secondary | ICD-10-CM | POA: Diagnosis not present

## 2021-04-17 DIAGNOSIS — Z881 Allergy status to other antibiotic agents status: Secondary | ICD-10-CM | POA: Insufficient documentation

## 2021-04-17 DIAGNOSIS — K219 Gastro-esophageal reflux disease without esophagitis: Secondary | ICD-10-CM | POA: Insufficient documentation

## 2021-04-17 DIAGNOSIS — Z8249 Family history of ischemic heart disease and other diseases of the circulatory system: Secondary | ICD-10-CM | POA: Insufficient documentation

## 2021-04-17 HISTORY — PX: CHOLECYSTECTOMY: SHX55

## 2021-04-17 LAB — SARS CORONAVIRUS 2 BY RT PCR (HOSPITAL ORDER, PERFORMED IN ~~LOC~~ HOSPITAL LAB): SARS Coronavirus 2: NEGATIVE

## 2021-04-17 SURGERY — LAPAROSCOPIC CHOLECYSTECTOMY
Anesthesia: General | Site: Abdomen

## 2021-04-17 MED ORDER — ONDANSETRON HCL 4 MG/2ML IJ SOLN
INTRAMUSCULAR | Status: DC | PRN
  Administered 2021-04-17: 4 mg via INTRAVENOUS

## 2021-04-17 MED ORDER — ROCURONIUM BROMIDE 100 MG/10ML IV SOLN
INTRAVENOUS | Status: DC | PRN
  Administered 2021-04-17: 30 mg via INTRAVENOUS
  Administered 2021-04-17: 20 mg via INTRAVENOUS

## 2021-04-17 MED ORDER — ORAL CARE MOUTH RINSE: 15.0000 mL | Freq: Once | OROMUCOSAL | Status: AC

## 2021-04-17 MED ORDER — CHLORHEXIDINE GLUCONATE 0.12 % MT SOLN
OROMUCOSAL | Status: AC
  Filled 2021-04-17: qty 15

## 2021-04-17 MED ORDER — HYDROMORPHONE HCL 1 MG/ML IJ SOLN
0.2500 mg | INTRAMUSCULAR | Status: DC | PRN
  Administered 2021-04-17 (×4): 0.5 mg via INTRAVENOUS
  Filled 2021-04-17 (×4): qty 0.5

## 2021-04-17 MED ORDER — SCOPOLAMINE 1 MG/3DAYS TD PT72
1.0000 | MEDICATED_PATCH | Freq: Once | TRANSDERMAL | Status: DC
  Administered 2021-04-17: 1.5 mg via TRANSDERMAL

## 2021-04-17 MED ORDER — PROMETHAZINE HCL 25 MG/ML IJ SOLN
6.2500 mg | INTRAMUSCULAR | Status: AC | PRN
  Administered 2021-04-17 (×2): 12.5 mg via INTRAVENOUS
  Filled 2021-04-17: qty 1

## 2021-04-17 MED ORDER — SODIUM CHLORIDE FLUSH 0.9 % IV SOLN
INTRAVENOUS | Status: AC
  Filled 2021-04-17: qty 10

## 2021-04-17 MED ORDER — CHLORHEXIDINE GLUCONATE CLOTH 2 % EX PADS: 6.0000 | MEDICATED_PAD | Freq: Once | CUTANEOUS | Status: DC

## 2021-04-17 MED ORDER — EPHEDRINE 5 MG/ML INJ
INTRAVENOUS | Status: AC
  Filled 2021-04-17: qty 10

## 2021-04-17 MED ORDER — LACTATED RINGERS IV SOLN
INTRAVENOUS | Status: DC
  Administered 2021-04-17: 1000 mL via INTRAVENOUS

## 2021-04-17 MED ORDER — SODIUM CHLORIDE 0.9 % IR SOLN
Status: DC | PRN
  Administered 2021-04-17: 1000 mL

## 2021-04-17 MED ORDER — CIPROFLOXACIN IN D5W 400 MG/200ML IV SOLN
INTRAVENOUS | Status: AC
  Filled 2021-04-17: qty 200

## 2021-04-17 MED ORDER — IPRATROPIUM-ALBUTEROL 0.5-2.5 (3) MG/3ML IN SOLN
3.0000 mL | RESPIRATORY_TRACT | Status: DC
  Administered 2021-04-17: 3 mL via RESPIRATORY_TRACT

## 2021-04-17 MED ORDER — MIDAZOLAM HCL 2 MG/2ML IJ SOLN
2.0000 mg | Freq: Once | INTRAMUSCULAR | Status: AC
  Administered 2021-04-17: 2 mg via INTRAVENOUS

## 2021-04-17 MED ORDER — LIDOCAINE HCL (CARDIAC) PF 100 MG/5ML IV SOSY
PREFILLED_SYRINGE | INTRAVENOUS | Status: DC | PRN
  Administered 2021-04-17: 100 mg via INTRAVENOUS

## 2021-04-17 MED ORDER — METHYLPREDNISOLONE SODIUM SUCC 125 MG IJ SOLR
INTRAMUSCULAR | Status: AC
  Filled 2021-04-17: qty 2

## 2021-04-17 MED ORDER — BUPIVACAINE LIPOSOME 1.3 % IJ SUSP
INTRAMUSCULAR | Status: AC
  Filled 2021-04-17: qty 20

## 2021-04-17 MED ORDER — PHENYLEPHRINE 40 MCG/ML (10ML) SYRINGE FOR IV PUSH (FOR BLOOD PRESSURE SUPPORT)
PREFILLED_SYRINGE | INTRAVENOUS | Status: AC
  Filled 2021-04-17: qty 20

## 2021-04-17 MED ORDER — MIDAZOLAM HCL 2 MG/2ML IJ SOLN
INTRAMUSCULAR | Status: AC
  Filled 2021-04-17: qty 2

## 2021-04-17 MED ORDER — HYDROMORPHONE HCL 1 MG/ML IJ SOLN
0.5000 mg | INTRAMUSCULAR | Status: AC | PRN
  Administered 2021-04-17 (×2): 0.5 mg via INTRAVENOUS
  Filled 2021-04-17 (×2): qty 0.5

## 2021-04-17 MED ORDER — IPRATROPIUM-ALBUTEROL 0.5-2.5 (3) MG/3ML IN SOLN
3.0000 mL | Freq: Once | RESPIRATORY_TRACT | Status: AC
  Administered 2021-04-17: 3 mL via RESPIRATORY_TRACT

## 2021-04-17 MED ORDER — IPRATROPIUM-ALBUTEROL 0.5-2.5 (3) MG/3ML IN SOLN
RESPIRATORY_TRACT | Status: AC
  Filled 2021-04-17: qty 3

## 2021-04-17 MED ORDER — OXYCODONE-ACETAMINOPHEN 5-325 MG PO TABS
ORAL_TABLET | ORAL | Status: AC
  Filled 2021-04-17: qty 2

## 2021-04-17 MED ORDER — HYDROMORPHONE HCL 2 MG PO TABS: 2.0000 mg | ORAL_TABLET | ORAL | 0 refills | Status: AC | PRN

## 2021-04-17 MED ORDER — FENTANYL CITRATE (PF) 250 MCG/5ML IJ SOLN
INTRAMUSCULAR | Status: AC
  Filled 2021-04-17: qty 5

## 2021-04-17 MED ORDER — METHYLPREDNISOLONE SODIUM SUCC 125 MG IJ SOLR
125.0000 mg | Freq: Once | INTRAMUSCULAR | Status: AC
  Administered 2021-04-17: 125 mg via INTRAVENOUS

## 2021-04-17 MED ORDER — CIPROFLOXACIN IN D5W 400 MG/200ML IV SOLN
400.0000 mg | INTRAVENOUS | Status: AC
  Administered 2021-04-17: 400 mg via INTRAVENOUS

## 2021-04-17 MED ORDER — MEPERIDINE HCL 50 MG/ML IJ SOLN: 6.2500 mg | INTRAMUSCULAR | Status: DC | PRN

## 2021-04-17 MED ORDER — SCOPOLAMINE 1 MG/3DAYS TD PT72
MEDICATED_PATCH | TRANSDERMAL | Status: AC
  Filled 2021-04-17: qty 1

## 2021-04-17 MED ORDER — BUPIVACAINE LIPOSOME 1.3 % IJ SUSP
INTRAMUSCULAR | Status: DC | PRN
  Administered 2021-04-17: 20 mL

## 2021-04-17 MED ORDER — SUCCINYLCHOLINE CHLORIDE 200 MG/10ML IV SOSY
PREFILLED_SYRINGE | INTRAVENOUS | Status: AC
  Filled 2021-04-17: qty 10

## 2021-04-17 MED ORDER — EPHEDRINE SULFATE 50 MG/ML IJ SOLN
INTRAMUSCULAR | Status: DC | PRN
  Administered 2021-04-17: 10 mg via INTRAVENOUS

## 2021-04-17 MED ORDER — SUCCINYLCHOLINE CHLORIDE 200 MG/10ML IV SOSY
PREFILLED_SYRINGE | INTRAVENOUS | Status: DC | PRN
  Administered 2021-04-17: 140 mg via INTRAVENOUS

## 2021-04-17 MED ORDER — CHLORHEXIDINE GLUCONATE 0.12 % MT SOLN
15.0000 mL | Freq: Once | OROMUCOSAL | Status: AC
  Administered 2021-04-17: 15 mL via OROMUCOSAL

## 2021-04-17 MED ORDER — OXYCODONE-ACETAMINOPHEN 5-325 MG PO TABS
2.0000 | ORAL_TABLET | Freq: Once | ORAL | Status: AC
  Administered 2021-04-17: 2 via ORAL

## 2021-04-17 MED ORDER — HEMOSTATIC AGENTS (NO CHARGE) OPTIME
TOPICAL | Status: DC | PRN
  Administered 2021-04-17: 1 via TOPICAL

## 2021-04-17 MED ORDER — SUGAMMADEX SODIUM 200 MG/2ML IV SOLN
INTRAVENOUS | Status: DC | PRN
  Administered 2021-04-17: 200 mg via INTRAVENOUS

## 2021-04-17 MED ORDER — MIDAZOLAM HCL 5 MG/5ML IJ SOLN
INTRAMUSCULAR | Status: DC | PRN
  Administered 2021-04-17: 2 mg via INTRAVENOUS

## 2021-04-17 MED ORDER — PROPOFOL 10 MG/ML IV BOLUS
INTRAVENOUS | Status: AC
  Filled 2021-04-17: qty 20

## 2021-04-17 MED ORDER — PROPOFOL 10 MG/ML IV BOLUS
INTRAVENOUS | Status: DC | PRN
  Administered 2021-04-17: 200 mg via INTRAVENOUS

## 2021-04-17 MED ORDER — KETOROLAC TROMETHAMINE 30 MG/ML IJ SOLN
30.0000 mg | Freq: Once | INTRAMUSCULAR | Status: AC
  Administered 2021-04-17: 30 mg via INTRAVENOUS
  Filled 2021-04-17: qty 1

## 2021-04-17 MED ORDER — FENTANYL CITRATE (PF) 100 MCG/2ML IJ SOLN
INTRAMUSCULAR | Status: DC | PRN
  Administered 2021-04-17 (×2): 100 ug via INTRAVENOUS
  Administered 2021-04-17: 50 ug via INTRAVENOUS

## 2021-04-17 SURGICAL SUPPLY — 44 items
ADH SKN CLS APL DERMABOND .7 (GAUZE/BANDAGES/DRESSINGS) ×1
APL PRP STRL LF DISP 70% ISPRP (MISCELLANEOUS) ×1
APPLIER CLIP ROT 10 11.4 M/L (STAPLE) ×2
APR CLP MED LRG 11.4X10 (STAPLE) ×1
BAG RETRIEVAL 10 (BASKET) ×1
CHLORAPREP W/TINT 26 (MISCELLANEOUS) ×2 IMPLANT
CLIP APPLIE ROT 10 11.4 M/L (STAPLE) ×1 IMPLANT
CLOTH BEACON ORANGE TIMEOUT ST (SAFETY) ×2 IMPLANT
COVER LIGHT HANDLE STERIS (MISCELLANEOUS) ×4 IMPLANT
DERMABOND ADVANCED (GAUZE/BANDAGES/DRESSINGS) ×1
DERMABOND ADVANCED .7 DNX12 (GAUZE/BANDAGES/DRESSINGS) ×1 IMPLANT
ELECT REM PT RETURN 9FT ADLT (ELECTROSURGICAL) ×2
ELECTRODE REM PT RTRN 9FT ADLT (ELECTROSURGICAL) ×1 IMPLANT
GAUZE 4X4 16PLY ~~LOC~~+RFID DBL (SPONGE) ×2 IMPLANT
GLOVE SRG 8 PF TXTR STRL LF DI (GLOVE) ×1 IMPLANT
GLOVE SURG POLYISO LF SZ7.5 (GLOVE) ×2 IMPLANT
GLOVE SURG POLYISO LF SZ8 (GLOVE) ×2 IMPLANT
GLOVE SURG UNDER POLY LF SZ7 (GLOVE) ×4 IMPLANT
GLOVE SURG UNDER POLY LF SZ8 (GLOVE) ×2
GOWN STRL REUS W/TWL LRG LVL3 (GOWN DISPOSABLE) ×6 IMPLANT
GOWN STRL REUS W/TWL XL LVL3 (GOWN DISPOSABLE) ×2 IMPLANT
HEMOSTAT SNOW SURGICEL 2X4 (HEMOSTASIS) ×2 IMPLANT
INST SET LAPROSCOPIC AP (KITS) ×2 IMPLANT
KIT TURNOVER KIT A (KITS) ×2 IMPLANT
MANIFOLD NEPTUNE II (INSTRUMENTS) ×2 IMPLANT
NEEDLE HYPO 18GX1.5 BLUNT FILL (NEEDLE) ×2 IMPLANT
NEEDLE HYPO 21X1.5 SAFETY (NEEDLE) ×2 IMPLANT
NEEDLE INSUFFLATION 14GA 120MM (NEEDLE) ×2 IMPLANT
NS IRRIG 1000ML POUR BTL (IV SOLUTION) ×2 IMPLANT
PACK LAP CHOLE LZT030E (CUSTOM PROCEDURE TRAY) ×2 IMPLANT
PAD ARMBOARD 7.5X6 YLW CONV (MISCELLANEOUS) ×2 IMPLANT
SET BASIN LINEN APH (SET/KITS/TRAYS/PACK) ×2 IMPLANT
SET TUBE SMOKE EVAC HIGH FLOW (TUBING) ×2 IMPLANT
SLEEVE ENDOPATH XCEL 5M (ENDOMECHANICALS) ×2 IMPLANT
SUT MNCRL AB 4-0 PS2 18 (SUTURE) ×4 IMPLANT
SUT VICRYL 0 UR6 27IN ABS (SUTURE) ×2 IMPLANT
SYR 20ML LL LF (SYRINGE) ×4 IMPLANT
SYS BAG RETRIEVAL 10MM (BASKET) ×1
SYSTEM BAG RETRIEVAL 10MM (BASKET) ×1 IMPLANT
TROCAR ENDO BLADELESS 11MM (ENDOMECHANICALS) ×2 IMPLANT
TROCAR XCEL NON-BLD 5MMX100MML (ENDOMECHANICALS) ×2 IMPLANT
TROCAR XCEL UNIV SLVE 11M 100M (ENDOMECHANICALS) ×2 IMPLANT
TUBE CONNECTING 12X1/4 (SUCTIONS) ×2 IMPLANT
WARMER LAPAROSCOPE (MISCELLANEOUS) ×2 IMPLANT

## 2021-04-17 NOTE — Anesthesia Procedure Notes (Signed)
Procedure Name: Intubation Date/Time: 04/17/2021 7:41 AM Performed by: Riki Sheer, CRNA Pre-anesthesia Checklist: Patient identified, Emergency Drugs available, Suction available, Patient being monitored and Timeout performed Patient Re-evaluated:Patient Re-evaluated prior to induction Oxygen Delivery Method: Circle system utilized Induction Type: IV induction and Rapid sequence Laryngoscope Size: Miller and 2 Grade View: Grade I Tube type: Oral Tube size: 7.0 mm Number of attempts: 1 Airway Equipment and Method: Stylet Placement Confirmation: ETT inserted through vocal cords under direct vision, positive ETCO2, CO2 detector and breath sounds checked- equal and bilateral Secured at: 22 cm Tube secured with: Tape Dental Injury: Teeth and Oropharynx as per pre-operative assessment

## 2021-04-17 NOTE — Anesthesia Postprocedure Evaluation (Signed)
Anesthesia Post Note  Patient: Kathleen [name]  Procedure(s) Performed: LAPAROSCOPIC CHOLECYSTECTOMY (Abdomen)  Patient location during evaluation: PACU Anesthesia Type: General Level of consciousness: awake and alert and oriented Pain management: satisfactory to patient Vital Signs Assessment: post-procedure vital signs reviewed and stable Respiratory status: spontaneous breathing and respiratory function stable Cardiovascular status: blood pressure returned to baseline and stable Postop Assessment: no apparent nausea or vomiting Anesthetic complications: no Comments: Patient discharged home as per Dr. Arnoldo Morale   No notable events documented.   Last Vitals:  Vitals:   04/17/21 1130 04/17/21 1139  BP: (!) 115/91   Pulse:    Resp: 19   Temp:    SpO2: 94% 92%    Last Pain:  Vitals:   04/17/21 1145  TempSrc:   PainSc: 8                  [name] C Jacky Hartung

## 2021-04-17 NOTE — Transfer of Care (Signed)
Immediate Anesthesia Transfer of Care Note  Patient: BLOSSOM FAMOUS  Procedure(s) Performed: LAPAROSCOPIC CHOLECYSTECTOMY (Abdomen)  Patient Location: PACU  Anesthesia Type:General  Level of Consciousness: awake and alert   Airway & Oxygen Therapy: Patient Spontanous Breathing and Patient connected to nasal cannula oxygen  Post-op Assessment: Report given to RN and Post -op Vital signs reviewed and stable  Post vital signs: Reviewed and stable  Last Vitals:  Vitals Value Taken Time  BP 133/76 04/17/21 0837  Temp 37.4 C 04/17/21 0837  Pulse 77 04/17/21 0839  Resp 17 04/17/21 0839  SpO2 95 % 04/17/21 0839  Vitals shown include unvalidated device data.  Last Pain:  Vitals:   04/17/21 0659  TempSrc: Oral  PainSc: 0-No pain      Patients Stated Pain Goal: 9 ([id number])  Complications: No notable events documented.

## 2021-04-17 NOTE — Interval H&P Note (Signed)
History and Physical Interval Note:  04/17/2021 6:55 AM  Kathleen [name]  has presented today for surgery, with the diagnosis of Biliary Colic.  The various methods of treatment have been discussed with the patient and family. After consideration of risks, benefits and other options for treatment, the patient has consented to  Procedure(s): LAPAROSCOPIC CHOLECYSTECTOMY (N/A) as a surgical intervention.  The patient's history has been reviewed, patient examined, no change in status, stable for surgery.  I have reviewed the patient's chart and labs.  Questions were answered to the patient's satisfaction.     Aviva Signs

## 2021-04-17 NOTE — Op Note (Signed)
Patient:  Kathleen [name]  DOB:  03-02-1961  MRN:  [id number]   Preop Diagnosis: Biliary colic  Postop Diagnosis: Same  Procedure: Laparoscopic cholecystectomy  Surgeon: Aviva Signs, MD  Anes: General endotracheal  Indications: Patient is a 60 year old white female who presents with biliary colic.  The risks and benefits of the procedure including bleeding, infection, hepatobiliary injury, and the possibility of an open procedure were fully explained to the patient, who gave informed consent.  Procedure note: The patient was placed in the supine position.  After induction of general endotracheal anesthesia, the abdomen was prepped and draped using the usual sterile technique with ChloraPrep.  Surgical site confirmation was performed.  A supraumbilical incision was made down to the fascia.  A Veress needle was introduced into the abdominal cavity and confirmation of placement was done using the saline drop test.  The abdomen was then insufflated to 15 mmHg pressure.  An 11 mm trocar was introduced into the abdominal cavity under direct visualization without difficulty.  The patient was placed in reverse Trendelenburg position and an additional 67m trocar was placed in the epigastric region and 5 mm trochars were placed in the right upper quadrant and right flank regions.  Liver was inspected and noted to be within normal limits.  The gallbladder was retracted in a dynamic fashion in order to provide a critical view of the triangle of Calot.  The cystic duct was first identified.  Its juncture to the infundibulum was fully identified.  Endoclips were placed proximally and distally on the cystic duct, and the cystic duct was divided.  This was likewise done of the cystic artery.  The gallbladder was freed away from the gallbladder fossa using Bovie electrocautery.  The gallbladder was delivered through the epigastric trocar site using an Endo Catch bag.  The gallbladder fossa was inspected and no  abnormal bleeding or bile leakage was noted.  Surgicel was placed in the gallbladder fossa.  All fluid and air were then evacuated from the abdominal cavity prior to removal of the trochars.  All wounds were irrigated with normal saline.  All wounds were injected with Exparel.  All incisions were closed using a 4-0 Monocryl subcuticular suture.  Dermabond was applied.  All tape and needle counts were correct at the end of the procedure.  The patient was extubated in the operating room and transferred to PACU in stable condition.  Complications: None  EBL: Minimal  Specimen: Gallbladder

## 2021-04-17 NOTE — Anesthesia Preprocedure Evaluation (Addendum)
Anesthesia Evaluation  Patient identified by MRN, date of birth, ID band Patient awake    Reviewed: Allergy & Precautions, NPO status , Patient's Chart, lab work & pertinent test results  History of Anesthesia Complications (+) PONV and history of anesthetic complications  Airway Mallampati: II  TM Distance: >3 FB Neck ROM: Full    Dental  (+) Dental Advisory Given, Loose,    Pulmonary shortness of breath, with exertion, at rest and Long-Term Oxygen Therapy, sleep apnea , COPD,  oxygen dependent,    Pulmonary exam normal breath sounds clear to auscultation       Cardiovascular Exercise Tolerance: Poor Normal cardiovascular exam+ Valvular Problems/Murmurs MVP  Rhythm:Regular Rate:Normal  Pulmonary HTN   Neuro/Psych PSYCHIATRIC DISORDERS Anxiety Depression  Neuromuscular disease    GI/Hepatic Neg liver ROS, GERD  Medicated,  Endo/Other  Hypothyroidism Adrenal insufficiency   Renal/GU negative Renal ROS     Musculoskeletal  (+) Fibromyalgia -  Abdominal   Peds  Hematology  (+) anemia ,   Anesthesia Other Findings Sarcoidosis   Reproductive/Obstetrics                            Anesthesia Physical  Anesthesia Plan  ASA: 4  Anesthesia Plan: General   Post-op Pain Management:    Induction:   PONV Risk Score and Plan: 4 or greater and Ondansetron, Dexamethasone and Midazolam  Airway Management Planned: Oral ETT  Additional Equipment:   Intra-op Plan:   Post-operative Plan: Extubation in OR  Informed Consent: I have reviewed the patients History and Physical, chart, labs and discussed the procedure including the risks, benefits and alternatives for the proposed anesthesia with the patient or authorized representative who has indicated his/her understanding and acceptance.     Dental advisory given  Plan Discussed with: CRNA and Surgeon  Anesthesia Plan Comments: (Will receive  stress dose solumedrol 130 mg pre op  )        Anesthesia Quick Evaluation

## 2021-04-18 ENCOUNTER — Encounter (HOSPITAL_COMMUNITY): Payer: Self-pay | Admitting: General Surgery

## 2021-04-18 DIAGNOSIS — D860 Sarcoidosis of lung: Secondary | ICD-10-CM | POA: Diagnosis not present

## 2021-04-18 DIAGNOSIS — J9601 Acute respiratory failure with hypoxia: Secondary | ICD-10-CM | POA: Diagnosis not present

## 2021-04-18 DIAGNOSIS — M6281 Muscle weakness (generalized): Secondary | ICD-10-CM | POA: Diagnosis not present

## 2021-04-18 DIAGNOSIS — J849 Interstitial pulmonary disease, unspecified: Secondary | ICD-10-CM | POA: Diagnosis not present

## 2021-04-18 LAB — SURGICAL PATHOLOGY

## 2021-04-19 ENCOUNTER — Telehealth: Payer: Self-pay | Admitting: Family Medicine

## 2021-04-19 MED ORDER — DOXYCYCLINE HYCLATE 100 MG PO TABS: ORAL_TABLET | ORAL | 0 refills | Status: DC

## 2021-04-19 NOTE — Telephone Encounter (Signed)
Doxycycline 100 mg 1 twice daily 7 days warm compresses frequently if it starts enlarging to the point where it needs drainage she will need to follow-up here or urgent care or ER

## 2021-04-19 NOTE — Telephone Encounter (Signed)
Patient has abscess in head again,she states you have treated before and she is needing something called into [facility]

## 2021-04-19 NOTE — Telephone Encounter (Signed)
Pt contacted and verbalized understanding. Doxycycline sent to pharmacy

## 2021-04-20 ENCOUNTER — Other Ambulatory Visit (HOSPITAL_COMMUNITY): Payer: Self-pay

## 2021-04-20 ENCOUNTER — Other Ambulatory Visit: Payer: Self-pay | Admitting: Family Medicine

## 2021-04-21 ENCOUNTER — Telehealth: Payer: Self-pay | Admitting: Family Medicine

## 2021-04-21 ENCOUNTER — Other Ambulatory Visit (HOSPITAL_COMMUNITY): Payer: Self-pay

## 2021-04-21 DIAGNOSIS — J9601 Acute respiratory failure with hypoxia: Secondary | ICD-10-CM | POA: Diagnosis not present

## 2021-04-21 DIAGNOSIS — D860 Sarcoidosis of lung: Secondary | ICD-10-CM | POA: Diagnosis not present

## 2021-04-21 DIAGNOSIS — M6281 Muscle weakness (generalized): Secondary | ICD-10-CM | POA: Diagnosis not present

## 2021-04-21 DIAGNOSIS — J849 Interstitial pulmonary disease, unspecified: Secondary | ICD-10-CM | POA: Diagnosis not present

## 2021-04-21 MED ORDER — PANTOPRAZOLE SODIUM 40 MG PO TBEC
DELAYED_RELEASE_TABLET | Freq: Every day | ORAL | 1 refills | Status: DC
  Filled 2021-04-21: qty 90, 90d supply, fill #0
  Filled 2021-08-14: qty 90, 90d supply, fill #1

## 2021-04-21 MED ORDER — OXYCODONE HCL 10 MG PO TABS
ORAL_TABLET | ORAL | 0 refills | Status: DC
  Filled 2021-04-21 (×2): qty 180, fill #0
  Filled 2021-04-25 (×2): qty 180, 30d supply, fill #0

## 2021-04-21 MED ORDER — MIDODRINE HCL 5 MG PO TABS
ORAL_TABLET | Freq: Two times a day (BID) | ORAL | 5 refills | Status: DC
  Filled 2021-04-21: qty 60, 30d supply, fill #0
  Filled 2021-05-23: qty 60, 30d supply, fill #1
  Filled 2021-07-21: qty 60, 30d supply, fill #2
  Filled 2021-08-25: qty 60, 30d supply, fill #3

## 2021-04-21 NOTE — Telephone Encounter (Signed)
Patient has been advised according to providers recommendations for pain medication management. She verbalizes understanding.

## 2021-04-21 NOTE — Telephone Encounter (Signed)
Patient called in requesting an appt I offered her Monday but she said no she has to be seen today. She has an abscess on her head. Please advise.   CB# [phone]

## 2021-04-21 NOTE — Telephone Encounter (Signed)
Inform the patient that her pain medication was approved for Monday  Absolutely do not take pain medicine that was prescribed by Dr. Arnoldo Morale with the pain medicine that we prescribe  Keep all regular pain management follow-up visits

## 2021-04-21 NOTE — Telephone Encounter (Signed)
Patient has been informed to go to UC or ER , due to being completely booked today.

## 2021-04-25 ENCOUNTER — Telehealth: Payer: Self-pay | Admitting: Family Medicine

## 2021-04-25 ENCOUNTER — Other Ambulatory Visit (HOSPITAL_COMMUNITY): Payer: Self-pay

## 2021-04-25 ENCOUNTER — Other Ambulatory Visit: Payer: Self-pay | Admitting: Family Medicine

## 2021-04-25 NOTE — Telephone Encounter (Signed)
Patient has current script at pharmacy that they are filling today for pick up. Patient advised per Dr Nicki Reaper: Do not mix the pain medication given from surgeon last week with her chronic pain medication. Patient verbalized understanding and stated she is out of Hydromorphone and has not taken since Saturday and would never mix medications. Patient states she was offered a refill but is not going to get it because that medication made her not able to function and on her current pain med she could take it and still function with daily activities.

## 2021-04-25 NOTE — Telephone Encounter (Signed)
Nurses Patient is on chronic pain management Recently she had surgery and was placed on some pain medicine by Dr. Arnoldo Morale.  Find out regarding her chronic pain medicine-oxycodone how many days does she have left? Oxycodone should not be taken with the other pain medicine. Please try to clarify her status on oxycodone and if we need to send in a new prescription for this week thank you

## 2021-04-28 ENCOUNTER — Telehealth (INDEPENDENT_AMBULATORY_CARE_PROVIDER_SITE_OTHER): Payer: 59 | Admitting: General Surgery

## 2021-04-28 DIAGNOSIS — Z09 Encounter for follow-up examination after completed treatment for conditions other than malignant neoplasm: Secondary | ICD-10-CM

## 2021-04-28 NOTE — Telephone Encounter (Signed)
Postoperative telephone visit performed with patient.  Patient states she is doing well.  She is having some pain control issues especially in her back.  She has contacted Dr. Wolfgang Phoenix as she is on chronic pain management plan.  She is having some urgency in her bowels, but I told her this was normal and should resolve with time.  She may eat yogurt to help out with this.  She is happy that she did get discharged the same day.  She should gradually return to normal activity.  Follow-up with me as needed.  Total telephone time was 3 minutes.  As this was a part of the global surgical fee, this was not a billable visit.

## 2021-05-02 ENCOUNTER — Other Ambulatory Visit (HOSPITAL_COMMUNITY): Payer: Self-pay

## 2021-05-02 ENCOUNTER — Other Ambulatory Visit: Payer: Self-pay | Admitting: "Endocrinology

## 2021-05-03 ENCOUNTER — Other Ambulatory Visit (HOSPITAL_COMMUNITY): Payer: Self-pay

## 2021-05-03 MED ORDER — HYDROCORTISONE 10 MG PO TABS
ORAL_TABLET | ORAL | 0 refills | Status: DC
  Filled 2021-05-03: qty 30, 30d supply, fill #0

## 2021-05-03 NOTE — Telephone Encounter (Signed)
Are you filling this prescription?   It looks like she should be taking 2 of these a day per past history.  Last filled by Molinda Bailiff MD on 02/15/2021

## 2021-05-08 DIAGNOSIS — E2740 Unspecified adrenocortical insufficiency: Secondary | ICD-10-CM | POA: Diagnosis not present

## 2021-05-08 DIAGNOSIS — E039 Hypothyroidism, unspecified: Secondary | ICD-10-CM | POA: Diagnosis not present

## 2021-05-09 ENCOUNTER — Ambulatory Visit (INDEPENDENT_AMBULATORY_CARE_PROVIDER_SITE_OTHER): Payer: 59 | Admitting: Internal Medicine

## 2021-05-09 ENCOUNTER — Encounter (INDEPENDENT_AMBULATORY_CARE_PROVIDER_SITE_OTHER): Payer: Self-pay | Admitting: Internal Medicine

## 2021-05-09 ENCOUNTER — Other Ambulatory Visit: Payer: Self-pay

## 2021-05-09 ENCOUNTER — Other Ambulatory Visit (HOSPITAL_COMMUNITY): Payer: Self-pay

## 2021-05-09 VITALS — BP 122/80 | HR 79 | Temp 98.1°F | Ht 70.0 in | Wt 235.0 lb

## 2021-05-09 DIAGNOSIS — R197 Diarrhea, unspecified: Secondary | ICD-10-CM | POA: Diagnosis not present

## 2021-05-09 DIAGNOSIS — E559 Vitamin D deficiency, unspecified: Secondary | ICD-10-CM | POA: Diagnosis not present

## 2021-05-09 DIAGNOSIS — R1319 Other dysphagia: Secondary | ICD-10-CM

## 2021-05-09 DIAGNOSIS — K219 Gastro-esophageal reflux disease without esophagitis: Secondary | ICD-10-CM | POA: Diagnosis not present

## 2021-05-09 LAB — COMPREHENSIVE METABOLIC PANEL
ALT: 13 IU/L (ref 0–32)
AST: 14 IU/L (ref 0–40)
Albumin/Globulin Ratio: 2 (ref 1.2–2.2)
Albumin: 4.4 g/dL (ref 3.8–4.9)
Alkaline Phosphatase: 86 IU/L (ref 44–121)
BUN/Creatinine Ratio: 16 (ref 9–23)
BUN: 13 mg/dL (ref 6–24)
Bilirubin Total: 0.2 mg/dL (ref 0.0–1.2)
CO2: 26 mmol/L (ref 20–29)
Calcium: 9.6 mg/dL (ref 8.7–10.2)
Chloride: 99 mmol/L (ref 96–106)
Creatinine, Ser: 0.83 mg/dL (ref 0.57–1.00)
Globulin, Total: 2.2 g/dL (ref 1.5–4.5)
Glucose: 95 mg/dL (ref 65–99)
Potassium: 4.1 mmol/L (ref 3.5–5.2)
Sodium: 140 mmol/L (ref 134–144)
Total Protein: 6.6 g/dL (ref 6.0–8.5)
eGFR: 82 mL/min/{1.73_m2} (ref >59)

## 2021-05-09 LAB — T4, FREE: Free T4: 1.06 ng/dL (ref 0.82–1.77)

## 2021-05-09 LAB — TSH: TSH: 0.668 u[IU]/mL (ref 0.450–4.500)

## 2021-05-09 MED ORDER — CHOLESTYRAMINE 4 G PO PACK
4.0000 g | PACK | Freq: Two times a day (BID) | ORAL | 2 refills | Status: DC
  Filled 2021-05-09: qty 60, 30d supply, fill #0

## 2021-05-09 NOTE — Progress Notes (Signed)
Presenting complaint;  Diarrhea.  Database and subjective:  Patient is 60 year old Caucasian female with multiple comorbidities who was seen 3 months ago for nausea vomiting and abdominal pain.  Abdominal ultrasound was negative for cholelithiasis.  She underwent esophagogastroduodenoscopy on 03/03/2021 and this study was negative for peptic ulcer disease.  Gastric biopsy revealed reactive changes.  No evidence of H. pylori.  Her esophagus was dilated given history of dysphagia. She then had a HIDA scan with [facility].  EF was 92% but her symptoms were reproduced.  She went on to have cholecystectomy by Dr. Aviva Signs last month. She says nausea and vomiting has resolved.  She is not complaining of diarrhea.  She has loose stool every time she eats.  It is yellow in color.  She denies melena or rectal bleeding. She has not taken Zofran since her cholecystectomy.  She says heartburn is well controlled with therapy.  She feels dysphagia has improved but not 100%.  She has difficulty only when she does not chew food thoroughly. She remains with generalized pain as well as left hip pain secondary to avascular necrosis.  She is deemed to be too high risk for surgery.  She also complains of pins-and-needles in her legs and hands.  She is hoping that she could get electric scooter which would improve quality of her life.  Current Medications: Outpatient Encounter Medications as of 05/09/2021  Medication Sig   acetaminophen (TYLENOL) 500 MG tablet Take 1,000 mg by mouth every 8 (eight) hours as needed for headache.   docusate sodium (COLACE) 100 MG capsule Take 100 mg by mouth 2 (two) times daily.   EPINEPHrine 0.3 mg/0.3 mL IJ SOAJ injection Inject 0.3 mLs (0.3 mg total) into the muscle as needed for anaphylaxis.   ferrous sulfate 325 (65 FE) MG EC tablet Take 325 mg by mouth daily with breakfast.   FLUoxetine (PROZAC) 20 MG tablet Take 2 tablets by mouth daily   hydrocortisone (CORTEF) 10 MG tablet Take 1  tablet (10 mg total) by mouth daily with lunch.   hydrOXYzine (ATARAX/VISTARIL) 25 MG tablet TAKE 1 TABLET BY MOUTH 3 TIMES DAILY AS NEEDED FOR ANXIETY   levothyroxine (SYNTHROID) 75 MCG tablet TAKE 1 TABLET BY MOUTH BEFORE BREAKFAST EVERY DAY EXCEPT TAKE 1/2 TABLET 1 DAY A WEEK (Patient taking differently: Take 75 mcg by mouth daily before breakfast.)   linaclotide (LINZESS) 290 MCG CAPS capsule Take 1 capsule (290 mcg total) by mouth daily before breakfast.   metoCLOPramide (REGLAN) 10 MG tablet Take 1 tablet (10 mg total) by mouth 3 (three) times daily before meals.   midodrine (PROAMATINE) 5 MG tablet TAKE 1 TABLET BY MOUTH TWICE DAILY WITH A MEAL   ondansetron (ZOFRAN-ODT) 8 MG disintegrating tablet DISSOLVE 1 TABLET BY MOUTH EVERY 8 HOURS AS NEEDED FOR NAUSEA & VOMITING   Oxycodone HCl 10 MG TABS Take 1 tablet by mouth every 4 hours as needed for pain(max 6 tablets daily)  05/24/21   Oxycodone HCl 10 MG TABS Take 1 tablet by mouth every 4 hours as needed  max 6 per day (Patient taking differently: Take 1 tablet by mouth every 4 hours as needed  max 6 per day)   Oxycodone HCl 10 MG TABS TAKE 1 TABLET BY MOUTH EVERY 4 HOURS AS NEEDED FOR PAIN. MAX 6 PER DAY 04/24/21   pantoprazole (PROTONIX) 40 MG tablet TAKE 1 TABLET BY MOUTH ONCE DAILY   predniSONE (DELTASONE) 5 MG tablet TAKE 2 TABLETS BY MOUTH ONCE A  DAY   promethazine (PHENERGAN) 25 MG suppository Unwrap and insert 1 suppository (25 mg total) rectally every 6 (six) hours as needed for nausea or vomiting.   [DISCONTINUED] doxycycline (VIBRA-TABS) 100 MG tablet Take one tablet po BID for 7 days   No facility-administered encounter medications on file as of 05/09/2021.    Objective: Blood pressure 122/80, pulse 79, temperature 98.1 F (36.7 C), temperature source Oral, height '5\' 10"'$  (1.778 m), weight 235 lb (106.6 kg), last menstrual period 05/17/2016. Patient is alert and in no acute distress. Conjunctiva is pink. Sclera is  nonicteric Oropharyngeal mucosa is normal. No neck masses or thyromegaly noted. Cardiac exam with regular rhythm normal S1 and S2. No murmur or gallop noted. Lungs are clear to auscultation. Abdomen is full.  On palpation is soft.  She has mild midepigastric tenderness.  No organomegaly or masses. No LE edema or clubbing noted.   Assessment:  #1.  Postcholecystectomy diarrhea.  This should improve with time but in the meantime we will treat her with cholestyramine.  #2.  Chronic GERD.  She is doing well with therapy.  #3.  Esophageal dysphagia.  She has responded partially to esophageal dilation.  Recent EGD did not show any structural abnormality.  If dysphagia worsens would consider esophageal manometry.  #4.  History of vitamin D deficiency.  Review of records reveal that her vitamin D levels are low but she is not on any therapy.  Vitamin D levels were not ordered through this office.   Plan:  Vitamin D2 level. Cholestyramine 4 g p.o. twice daily. Patient advised to take 1 dose to begin with.  She is also asked not to take any medication for 2 hours before and 2 hours after taking cholestyramine. Patient will call office with progress report in few weeks. Office visit in 6 months.

## 2021-05-09 NOTE — Patient Instructions (Addendum)
Physician will call with results of blood test when completed. Remember not to take any medication for 2 hours before and 2 hours after taking other medications. Can start by taking 1 dose a day and take additional dose if necessary. Please call office with progress report. Notify if swallowing difficulty worsens or you have food impaction.

## 2021-05-10 ENCOUNTER — Other Ambulatory Visit (HOSPITAL_COMMUNITY): Payer: Self-pay

## 2021-05-12 ENCOUNTER — Other Ambulatory Visit (HOSPITAL_COMMUNITY): Payer: Self-pay

## 2021-05-13 ENCOUNTER — Other Ambulatory Visit (HOSPITAL_COMMUNITY): Payer: Self-pay

## 2021-05-15 ENCOUNTER — Encounter: Payer: Self-pay | Admitting: "Endocrinology

## 2021-05-15 ENCOUNTER — Other Ambulatory Visit: Payer: Self-pay

## 2021-05-15 ENCOUNTER — Ambulatory Visit (INDEPENDENT_AMBULATORY_CARE_PROVIDER_SITE_OTHER): Payer: 59 | Admitting: "Endocrinology

## 2021-05-15 ENCOUNTER — Other Ambulatory Visit (HOSPITAL_COMMUNITY): Payer: Self-pay

## 2021-05-15 VITALS — BP 104/62 | HR 64 | Ht 70.0 in | Wt 236.8 lb

## 2021-05-15 DIAGNOSIS — E2740 Unspecified adrenocortical insufficiency: Secondary | ICD-10-CM | POA: Diagnosis not present

## 2021-05-15 DIAGNOSIS — E039 Hypothyroidism, unspecified: Secondary | ICD-10-CM

## 2021-05-15 MED ORDER — LEVOTHYROXINE SODIUM 75 MCG PO TABS
75.0000 ug | ORAL_TABLET | Freq: Every day | ORAL | 1 refills | Status: DC
  Filled 2021-05-15 – 2021-05-31 (×2): qty 90, 90d supply, fill #0
  Filled 2021-08-04: qty 90, 90d supply, fill #1
  Filled 2021-08-04: qty 30, 30d supply, fill #1
  Filled 2021-08-23 – 2021-08-25 (×2): qty 30, 30d supply, fill #2
  Filled 2021-09-12: qty 30, 30d supply, fill #3

## 2021-05-15 MED ORDER — HYDROCORTISONE 10 MG PO TABS
ORAL_TABLET | ORAL | 0 refills | Status: DC
  Filled 2021-05-15: qty 30, 10d supply, fill #0

## 2021-05-15 NOTE — Patient Instructions (Signed)

## 2021-05-15 NOTE — Progress Notes (Signed)
05/15/2021, 4:28 PM     Endocrinology follow-up note  Subjective:    Patient ID: Kathleen [name], female    DOB: 1961-03-23, PCP [name], MD   Past Medical History:  Diagnosis Date   Adrenal insufficiency ([place])    Anemia    Anxiety    Avascular bone necrosis ([place])    Breast fibrocystic disorder    Chronic pain    COPD (chronic obstructive pulmonary disease) ([place])    Encounter for long-term opiate analgesic use 02/02/2020   Patient has chronic pain with femoral head avascular necrosis.  Is under pain contract   Fibromyalgia    Gastroesophageal reflux disease    Hypothyroidism    Mitral valve prolapse    Orthostatic hypotension    Peripheral neuropathy    PONV (postoperative nausea and vomiting)    Restrictive lung disease    Moderate to severe   Sarcoidosis    Biopsy proven - [facility]   SOB (shortness of breath)    chronic   Past Surgical History:  Procedure Laterality Date   BIOPSY  10/17/2018   Procedure: BIOPSY;  Surgeon: Rogene Houston, MD;  [facility]: [facility];  Service: Endoscopy;;  duodenum, antrum, gastric body   BIOPSY  03/03/2021   Procedure: BIOPSY;  Surgeon: Rogene Houston, MD;  Location: [facility];  Service: Endoscopy;;   BREAST LUMPECTOMY Bilateral 01/12/2011   right   CHOLECYSTECTOMY N/A 04/17/2021   Procedure: LAPAROSCOPIC CHOLECYSTECTOMY;  Surgeon: Aviva Signs, MD;  Location: [facility];  Service: General;  Laterality: N/A;   ESOPHAGEAL DILATION N/A 03/03/2021   Procedure: ESOPHAGEAL DILATION;  Surgeon: Rogene Houston, MD;  [facility];  Service: Endoscopy;  Laterality: N/A;   ESOPHAGOGASTRODUODENOSCOPY (EGD) WITH PROPOFOL N/A 10/17/2018   Procedure: ESOPHAGOGASTRODUODENOSCOPY (EGD) WITH PROPOFOL;  Surgeon: Rogene Houston, MD;  [facility];  Service: Endoscopy;  Laterality: N/A;  2:25   ESOPHAGOGASTRODUODENOSCOPY (EGD) WITH PROPOFOL N/A 03/03/2021   Procedure:  ESOPHAGOGASTRODUODENOSCOPY (EGD) WITH PROPOFOL;  Surgeon: Rogene Houston, MD;  [facility];  Service: Endoscopy;  Laterality: N/A;  12:15   TUBAL LIGATION     Social History   Socioeconomic History   Marital status: Married    Spouse name: Not on file   Number of children: 1   Years of education: Not on file   Highest education level: Not on file  Occupational History   Occupation: Equities trader Payable    Employer: [employer]  Tobacco Use   Smoking status: Never   Smokeless tobacco: Never  Vaping Use   Vaping Use: Never used  Substance and Sexual Activity   Alcohol use: No   Drug use: No   Sexual activity: Not Currently    Birth control/protection: Post-menopausal  Other Topics Concern   Not on file  Social History Narrative   Daily caffeine    Living with husband   Right handed   She has been on disability since 2014.    Social Determinants of Health   Financial Resource Strain: Not on file  Food Insecurity: Not on file  Transportation Needs: Not on file  Physical Activity: Not on file  Stress: Not on file  Social Connections: Not on file   Family History  Problem Relation Age of Onset   Cardiomyopathy Mother    Breast cancer Mother    Prostate cancer Father    Neurofibromatosis Cousin    Heart disease Maternal Aunt    Bipolar disorder Daughter    Colon cancer Neg Hx    Outpatient Encounter Medications as of 05/15/2021  Medication Sig   acetaminophen (TYLENOL) 500 MG tablet Take 1,000 mg by mouth every 8 (eight) hours as needed for headache.   cholestyramine (QUESTRAN) 4 g packet Mix 1 packet as directed and take by mouth twice daily.   docusate sodium (COLACE) 100 MG capsule Take 100 mg by mouth 2 (two) times daily.   EPINEPHrine 0.3 mg/0.3 mL IJ SOAJ injection Inject 0.3 mLs (0.3 mg total) into the muscle as needed for anaphylaxis.   ferrous sulfate 325 (65 FE) MG EC tablet Take 325 mg by mouth daily with breakfast.   FLUoxetine (PROZAC) 20 MG  tablet Take 2 tablets by mouth daily   hydrocortisone (CORTEF) 10 MG tablet Take 2 tablets by mouth at 8:00 am and 1 tablet at lunch   hydrOXYzine (ATARAX/VISTARIL) 25 MG tablet TAKE 1 TABLET BY MOUTH 3 TIMES DAILY AS NEEDED FOR ANXIETY   levothyroxine (SYNTHROID) 75 MCG tablet Take 1 tablet (75 mcg total) by mouth daily before breakfast.   linaclotide (LINZESS) 290 MCG CAPS capsule Take 1 capsule (290 mcg total) by mouth daily before breakfast.   midodrine (PROAMATINE) 5 MG tablet TAKE 1 TABLET BY MOUTH TWICE DAILY WITH A MEAL   ondansetron (ZOFRAN-ODT) 8 MG disintegrating tablet DISSOLVE 1 TABLET BY MOUTH EVERY 8 HOURS AS NEEDED FOR NAUSEA & VOMITING   Oxycodone HCl 10 MG TABS Take 1 tablet by mouth every 4 hours as needed for pain(max 6 tablets daily)  **05/24/21   pantoprazole (PROTONIX) 40 MG tablet TAKE 1 TABLET BY MOUTH ONCE DAILY   predniSONE (DELTASONE) 5 MG tablet TAKE 2 TABLETS BY MOUTH ONCE A DAY   promethazine (PHENERGAN) 25 MG suppository Unwrap and insert 1 suppository (25 mg total) rectally every 6 (six) hours as needed for nausea or vomiting.   [DISCONTINUED] hydrocortisone (CORTEF) 10 MG tablet Take 1 tablet (10 mg total) by mouth daily with lunch. (Patient taking differently: Pt taking 2 tablets at 8:00a.m and 1 tablet at lunch)   [DISCONTINUED] levothyroxine (SYNTHROID) 75 MCG tablet TAKE 1 TABLET BY MOUTH BEFORE BREAKFAST EVERY DAY EXCEPT TAKE 1/2 TABLET 1 DAY A WEEK (Patient taking differently: Take 75 mcg by mouth daily before breakfast.)   No facility-administered encounter medications on file as of 05/15/2021.   ALLERGIES: Allergies  Allergen Reactions   Bee Venom Anaphylaxis   Contrast Media [Iodinated Diagnostic Agents]     CT chest w/ contrast at OSH followed by SOB resolved w/ single dose steroids, never ENT swelling, intubation or pressors, has had multiple contrasted scans since   Bactrim [Sulfamethoxazole-Trimethoprim] Nausea And Vomiting   Codeine Itching    Morphine And Related Other (See Comments)    Unknown reaction- pt gets sick, dizzy, and confused Intolerance, not an allergy, received this in the emergency department recently without a problem.    VACCINATION STATUS: Immunization History  Administered Date(s) Administered   Influenza Whole 06/27/2010, 07/28/2011   Moderna Sars-Covid-2 Vaccination 10/27/2019, 11/27/2019   Pneumococcal Conjugate-13 06/15/2014   Pneumococcal Polysaccharide-23 06/10/2013    HPI Kylie R Renner is 60 y.o. female who is being seen in follow-up in the management of  adrenal insufficiency, hypothyroidism.  -She is on a stable dose of steroids including hydrocortisone 30 mg a day, prednisone 10 mg p.o. daily as well as levothyroxine 75 mcg p.o. daily.    She is returning for follow-up.  She has no new complaints today.  She continues to complain of fatigue.  She recently underwent cholecystectomy with subsequent improvement in her previous symptoms of abdominal pain, nausea, vomiting.   -Patient largely avoided ER visits in recent month. - Her history is complicated including sarcoidosis which she dealt with for more than the last 10 years.  She is on regular follow-up with her pulmonologist.  Over the years, her therapy included high-dose steroids, up to 60 mg daily of prednisone, for the last 4-6 years.  She is currently on maintenance dose of prednisone 10 mg p.o. daily.  For adrenal  insufficiency, she was initiated and kept on hydrocortisone. She is currently on 20 mg p.o. every morning and 10 mg p.o. daily every noon.     -She reports better consistency taking her medications this time.  She also has hypothyroidism on levothyroxine 75 mcg p.o. every morning.   -She has significant cognitive deficit.  She continues to have progressive weight gain up to 240 pounds currently.  - The details of her diagnosis with adrenal insufficiency  is not available to review. -She is not wearing her medic alert today.  .  She denies any history of tuberculosis, incarceration, nor overseas travel . -She denies family history of adrenal, pituitary dysfunction. She has family history of hypothyroidism in one sibling. - Denies any history of intra-abdominal injury. She is on hydrocodone for chronic pain and inhaled bronchodilators. She is known to have orthostatic hypotension for which she is on midodrine.     Review of Systems Limited as above.  Objective:    BP 104/62   Pulse 64   Ht '5\' 10"'$  (1.778 m)   Wt 236 lb 12.8 oz (107.4 kg)   LMP 05/17/2016   BMI 33.98 kg/m   Wt Readings from Last 3 Encounters:  05/15/21 236 lb 12.8 oz (107.4 kg)  05/09/21 235 lb (106.6 kg)  04/14/21 240 lb 9.6 oz (109.1 kg)    Physical Exam    CMP ( most recent) CMP     Component Value Date/Time   NA 140 05/08/2021 1419   K 4.1 05/08/2021 1419   CL 99 05/08/2021 1419   CO2 26 05/08/2021 1419   GLUCOSE 95 05/08/2021 1419   GLUCOSE 95 03/30/2021 1739   BUN 13 05/08/2021 1419   CREATININE 0.83 05/08/2021 1419   CREATININE 0.85 08/26/2014 0831   CALCIUM 9.6 05/08/2021 1419   PROT 6.6 05/08/2021 1419   ALBUMIN 4.4 05/08/2021 1419   AST 14 05/08/2021 1419   ALT 13 05/08/2021 1419   ALKPHOS 86 05/08/2021 1419   BILITOT 0.2 05/08/2021 1419   GFRNONAA >60 03/30/2021 1739   GFRAA 92 05/24/2020 1038     Diabetic Labs (most recent): Lab Results  Component Value Date   HGBA1C 5.4 02/03/2019   HGBA1C 5.0 12/31/2016   HGBA1C 5.3 08/21/2016     Lipid Panel ( most recent) Lipid Panel     Component Value Date/Time   CHOL 213 (H) 01/06/2020 0816   TRIG 182 (H) 01/06/2020 0816   HDL 48 01/06/2020 0816   CHOLHDL 4.4 01/06/2020 0816   CHOLHDL 4.0 08/26/2014 0831   VLDL 31 08/26/2014 0831   LDLCALC 133 (H) 01/06/2020 [id number]  Lab Results  Component Value Date   TSH 0.668 05/08/2021   TSH 0.307 (L) 11/09/2020   TSH 0.548 05/24/2020   TSH 0.689 11/20/2019   TSH 0.364 (L) 06/29/2019   TSH 0.746 02/03/2019    TSH 1.290 08/07/2018   TSH 2.280 04/17/2018   TSH 0.527 12/04/2017   TSH 0.242 (L) 10/22/2017   FREET4 1.06 05/08/2021   FREET4 1.32 11/09/2020   FREET4 1.46 05/24/2020   FREET4 1.25 11/20/2019   FREET4 1.43 06/29/2019   FREET4 1.32 02/03/2019   FREET4 1.38 08/09/2017   FREET4 1.15 04/19/2017   FREET4 0.99 12/31/2016      Assessment & Plan:   1. Adrenal insufficiency ([place]) -I have reviewed her new set of labs, available records and clinically evaluated this patient. She does have multiple risk factors for adrenal insufficiency  including exposure to high-dose steroids related to her sarcoidosis, as well as opioid therapy related to diffuse pain. -  She has taken prednisone up to 60 mg daily for up to 6 years prior to her diagnosis with adrenal insufficiency. -Her SPO2 is 94%, 5 to 6% deficit could be a reason for her fatigue.  She will not need any higher dose of steroids than what she takes. -She is on a stable dose of hydrocortisone 20 mg a.m. and 10 mg daily at noon in addition to her prednisone 10 mg p.o. daily continued related to her pulmonary sarcoidosis.     I had a lengthy discussion with her about the need to avoid any further unnecessary escalation on steroid replacement.    She is advised to remain on same steroid replacement dose and schedule.   -She will not be considered for work-up by withdrawing steroids supports,  risk of adrenal crisis.   -She does not have enough support to perform steroid injection at home. -She is advised to go to ER if she cannot keep her medications down due to intractable vomiting. - She will be evaluated for the need for mineralocorticoid replacement which may help to stabilize her blood pressure. She is currently on medodrine 5 mg by mouth twice a day, maintaining her blood pressure within normal range. - I had a long discussion with her regarding steroids sick day rules, risk of unintended weight gain/diabetes and the need for periodic  screening with A1c.  -Her last A1c was 5.4% in June 2020.  She will have point-of-care A1c during her next visit.      2. Hypothyroidism:  -Her recent labs for thyroid function are consistent with appropriate replacement.  She is advised to continue levothyroxine 75 mcg p.o. daily before breakfast.    - We discussed about the correct intake of her thyroid hormone, on empty stomach at fasting, with water, separated by at least 30 minutes from breakfast and other medications,  and separated by more than 4 hours from calcium, iron, multivitamins, acid reflux medications (PPIs). -Patient is made aware of the fact that thyroid hormone replacement is needed for life, dose to be adjusted by periodic monitoring of thyroid function tests.  - I advised her  to maintain close follow up with [name], MD for primary care needs.   I spent 25 minutes in the care of the patient today including review of labs from Thyroid Function, CMP, and other relevant labs ; imaging/biopsy records (current and previous including abstractions from other facilities); face-to-face time discussing  her lab results and symptoms, medications doses, her options of short and long term treatment based  on the latest standards of care / guidelines;   and documenting the encounter.  [name]  participated in the discussions, expressed understanding, and voiced agreement with the above plans.  All questions were answered to her satisfaction. she is encouraged to contact clinic should she have any questions or concerns prior to her return visit.    Follow up plan: Return in about 4 months (around 09/14/2021) for F/U with Pre-visit Labs, A1c -[place].   Glade Lloyd, MD [facility] [address] [place], [place] [address] Phone: [phone]  Fax: [phone]     05/15/2021, 4:28 PM  This note was partially dictated with voice recognition software. Similar sounding  words can be transcribed inadequately or may not  be corrected upon review.

## 2021-05-17 ENCOUNTER — Other Ambulatory Visit (HOSPITAL_COMMUNITY): Payer: Self-pay

## 2021-05-17 DIAGNOSIS — Z8616 Personal history of COVID-19: Secondary | ICD-10-CM | POA: Diagnosis not present

## 2021-05-17 DIAGNOSIS — K219 Gastro-esophageal reflux disease without esophagitis: Secondary | ICD-10-CM | POA: Diagnosis not present

## 2021-05-17 DIAGNOSIS — F32A Depression, unspecified: Secondary | ICD-10-CM | POA: Diagnosis not present

## 2021-05-17 DIAGNOSIS — J849 Interstitial pulmonary disease, unspecified: Secondary | ICD-10-CM | POA: Diagnosis not present

## 2021-05-17 DIAGNOSIS — D860 Sarcoidosis of lung: Secondary | ICD-10-CM | POA: Diagnosis not present

## 2021-05-17 DIAGNOSIS — R0602 Shortness of breath: Secondary | ICD-10-CM | POA: Diagnosis not present

## 2021-05-17 DIAGNOSIS — I959 Hypotension, unspecified: Secondary | ICD-10-CM | POA: Diagnosis not present

## 2021-05-18 ENCOUNTER — Other Ambulatory Visit (HOSPITAL_COMMUNITY): Payer: Self-pay

## 2021-05-18 ENCOUNTER — Telehealth: Payer: Self-pay | Admitting: Family Medicine

## 2021-05-18 MED ORDER — DOXYCYCLINE HYCLATE 100 MG PO TABS: 100.0000 mg | ORAL_TABLET | Freq: Two times a day (BID) | ORAL | 0 refills | Status: DC

## 2021-05-18 NOTE — Addendum Note (Signed)
Addended by: Dairl Ponder on: 05/18/2021 03:29 PM   Modules accepted: Orders

## 2021-05-18 NOTE — Telephone Encounter (Signed)
Patient notified and verbalized understanding. 

## 2021-05-18 NOTE — Telephone Encounter (Signed)
Excellent question that requires detailed explanation recommend that we discuss this further when she follows up

## 2021-05-18 NOTE — Telephone Encounter (Signed)
Prescription sent electronically to pharmacy. Patient notified and wondered if there was something that could be done or a specialist she could see to help keep her from getting these every fer weeks. Patient states the medicine or lancing will make them go away for a few weeks or more but they keep coming back. Please advise

## 2021-05-18 NOTE — Telephone Encounter (Signed)
May utilize an 1140 slot if necessary Preferably not on my half day Otherwise doxycycline 100 mg 1 twice daily for 10 days warm compresses 20 minutes every 2 hours if the abscess gets severe may need to go to ER in the meantime

## 2021-05-18 NOTE — Telephone Encounter (Signed)
Patient came in wanting to schedule appointment for next week because she has abscess under both arms please advise where to add to schedule next week

## 2021-05-19 DIAGNOSIS — M6281 Muscle weakness (generalized): Secondary | ICD-10-CM | POA: Diagnosis not present

## 2021-05-19 DIAGNOSIS — J9601 Acute respiratory failure with hypoxia: Secondary | ICD-10-CM | POA: Diagnosis not present

## 2021-05-19 DIAGNOSIS — J849 Interstitial pulmonary disease, unspecified: Secondary | ICD-10-CM | POA: Diagnosis not present

## 2021-05-19 DIAGNOSIS — D860 Sarcoidosis of lung: Secondary | ICD-10-CM | POA: Diagnosis not present

## 2021-05-21 ENCOUNTER — Inpatient Hospital Stay (HOSPITAL_COMMUNITY)
Admission: EM | Admit: 2021-05-21 | Discharge: 2021-05-22 | DRG: 177 | Disposition: A | Discharge status: Home / Self Care | Payer: 59 | Attending: Family Medicine | Admitting: Family Medicine

## 2021-05-21 ENCOUNTER — Other Ambulatory Visit: Payer: Self-pay

## 2021-05-21 ENCOUNTER — Encounter (HOSPITAL_COMMUNITY): Payer: Self-pay

## 2021-05-21 ENCOUNTER — Emergency Department (HOSPITAL_COMMUNITY): Payer: 59

## 2021-05-21 DIAGNOSIS — I950 Idiopathic hypotension: Secondary | ICD-10-CM

## 2021-05-21 DIAGNOSIS — Z79891 Long term (current) use of opiate analgesic: Secondary | ICD-10-CM | POA: Diagnosis not present

## 2021-05-21 DIAGNOSIS — R0902 Hypoxemia: Secondary | ICD-10-CM | POA: Diagnosis not present

## 2021-05-21 DIAGNOSIS — J9611 Chronic respiratory failure with hypoxia: Secondary | ICD-10-CM | POA: Diagnosis present

## 2021-05-21 DIAGNOSIS — E66812 Obesity, class 2: Secondary | ICD-10-CM | POA: Diagnosis present

## 2021-05-21 DIAGNOSIS — Z6833 Body mass index (BMI) 33.0-33.9, adult: Secondary | ICD-10-CM | POA: Diagnosis not present

## 2021-05-21 DIAGNOSIS — J849 Interstitial pulmonary disease, unspecified: Secondary | ICD-10-CM | POA: Diagnosis not present

## 2021-05-21 DIAGNOSIS — Z79899 Other long term (current) drug therapy: Secondary | ICD-10-CM

## 2021-05-21 DIAGNOSIS — K219 Gastro-esophageal reflux disease without esophagitis: Secondary | ICD-10-CM | POA: Diagnosis not present

## 2021-05-21 DIAGNOSIS — Z8616 Personal history of COVID-19: Secondary | ICD-10-CM | POA: Diagnosis not present

## 2021-05-21 DIAGNOSIS — M87852 Other osteonecrosis, left femur: Secondary | ICD-10-CM | POA: Diagnosis not present

## 2021-05-21 DIAGNOSIS — Z7952 Long term (current) use of systemic steroids: Secondary | ICD-10-CM

## 2021-05-21 DIAGNOSIS — M797 Fibromyalgia: Secondary | ICD-10-CM | POA: Diagnosis present

## 2021-05-21 DIAGNOSIS — J9621 Acute and chronic respiratory failure with hypoxia: Secondary | ICD-10-CM | POA: Diagnosis not present

## 2021-05-21 DIAGNOSIS — J441 Chronic obstructive pulmonary disease with (acute) exacerbation: Secondary | ICD-10-CM | POA: Diagnosis present

## 2021-05-21 DIAGNOSIS — Z91030 Bee allergy status: Secondary | ICD-10-CM

## 2021-05-21 DIAGNOSIS — G4733 Obstructive sleep apnea (adult) (pediatric): Secondary | ICD-10-CM | POA: Diagnosis present

## 2021-05-21 DIAGNOSIS — Z9981 Dependence on supplemental oxygen: Secondary | ICD-10-CM

## 2021-05-21 DIAGNOSIS — F418 Other specified anxiety disorders: Secondary | ICD-10-CM | POA: Diagnosis present

## 2021-05-21 DIAGNOSIS — Z8042 Family history of malignant neoplasm of prostate: Secondary | ICD-10-CM | POA: Diagnosis not present

## 2021-05-21 DIAGNOSIS — Z91199 Patient's noncompliance with other medical treatment and regimen due to unspecified reason: Secondary | ICD-10-CM

## 2021-05-21 DIAGNOSIS — E271 Primary adrenocortical insufficiency: Secondary | ICD-10-CM | POA: Diagnosis present

## 2021-05-21 DIAGNOSIS — E039 Hypothyroidism, unspecified: Secondary | ICD-10-CM | POA: Diagnosis present

## 2021-05-21 DIAGNOSIS — Z803 Family history of malignant neoplasm of breast: Secondary | ICD-10-CM

## 2021-05-21 DIAGNOSIS — I951 Orthostatic hypotension: Secondary | ICD-10-CM | POA: Diagnosis present

## 2021-05-21 DIAGNOSIS — F32A Depression, unspecified: Secondary | ICD-10-CM | POA: Diagnosis present

## 2021-05-21 DIAGNOSIS — Z8249 Family history of ischemic heart disease and other diseases of the circulatory system: Secondary | ICD-10-CM

## 2021-05-21 DIAGNOSIS — Z91041 Radiographic dye allergy status: Secondary | ICD-10-CM

## 2021-05-21 DIAGNOSIS — K581 Irritable bowel syndrome with constipation: Secondary | ICD-10-CM | POA: Diagnosis not present

## 2021-05-21 DIAGNOSIS — R0689 Other abnormalities of breathing: Secondary | ICD-10-CM | POA: Diagnosis not present

## 2021-05-21 DIAGNOSIS — R0602 Shortness of breath: Secondary | ICD-10-CM | POA: Diagnosis not present

## 2021-05-21 DIAGNOSIS — D860 Sarcoidosis of lung: Secondary | ICD-10-CM | POA: Diagnosis not present

## 2021-05-21 DIAGNOSIS — Z79890 Hormone replacement therapy (postmenopausal): Secondary | ICD-10-CM

## 2021-05-21 DIAGNOSIS — E2740 Unspecified adrenocortical insufficiency: Secondary | ICD-10-CM | POA: Diagnosis not present

## 2021-05-21 DIAGNOSIS — Z9119 Patient's noncompliance with other medical treatment and regimen: Secondary | ICD-10-CM

## 2021-05-21 DIAGNOSIS — M87052 Idiopathic aseptic necrosis of left femur: Secondary | ICD-10-CM | POA: Diagnosis present

## 2021-05-21 DIAGNOSIS — M6281 Muscle weakness (generalized): Secondary | ICD-10-CM | POA: Diagnosis not present

## 2021-05-21 DIAGNOSIS — D869 Sarcoidosis, unspecified: Secondary | ICD-10-CM | POA: Diagnosis not present

## 2021-05-21 DIAGNOSIS — I9589 Other hypotension: Secondary | ICD-10-CM | POA: Diagnosis present

## 2021-05-21 DIAGNOSIS — Z882 Allergy status to sulfonamides status: Secondary | ICD-10-CM

## 2021-05-21 DIAGNOSIS — U071 COVID-19: Secondary | ICD-10-CM | POA: Diagnosis not present

## 2021-05-21 DIAGNOSIS — I272 Pulmonary hypertension, unspecified: Secondary | ICD-10-CM | POA: Diagnosis not present

## 2021-05-21 DIAGNOSIS — G894 Chronic pain syndrome: Secondary | ICD-10-CM | POA: Diagnosis present

## 2021-05-21 DIAGNOSIS — F419 Anxiety disorder, unspecified: Secondary | ICD-10-CM | POA: Diagnosis present

## 2021-05-21 DIAGNOSIS — R059 Cough, unspecified: Secondary | ICD-10-CM | POA: Diagnosis present

## 2021-05-21 DIAGNOSIS — Z885 Allergy status to narcotic agent status: Secondary | ICD-10-CM

## 2021-05-21 DIAGNOSIS — J9601 Acute respiratory failure with hypoxia: Secondary | ICD-10-CM | POA: Diagnosis not present

## 2021-05-21 DIAGNOSIS — E669 Obesity, unspecified: Secondary | ICD-10-CM | POA: Diagnosis not present

## 2021-05-21 LAB — COMPREHENSIVE METABOLIC PANEL
ALT: 18 U/L (ref 0–44)
AST: 16 U/L (ref 15–41)
Albumin: 3.6 g/dL (ref 3.5–5.0)
Alkaline Phosphatase: 66 U/L (ref 38–126)
Anion gap: 7 (ref 5–15)
BUN: 13 mg/dL (ref 6–20)
CO2: 29 mmol/L (ref 22–32)
Calcium: 9 mg/dL (ref 8.9–10.3)
Chloride: 104 mmol/L (ref 98–111)
Creatinine, Ser: 0.9 mg/dL (ref 0.44–1.00)
GFR, Estimated: >60 mL/min (ref >60)
Glucose, Bld: 107 mg/dL — ABNORMAL HIGH (ref 70–99)
Potassium: 3.5 mmol/L (ref 3.5–5.1)
Sodium: 140 mmol/L (ref 135–145)
Total Bilirubin: 0.4 mg/dL (ref 0.3–1.2)
Total Protein: 6.8 g/dL (ref 6.5–8.1)

## 2021-05-21 LAB — CBC WITH DIFFERENTIAL/PLATELET
Abs Immature Granulocytes: 0.13 10*3/uL — ABNORMAL HIGH (ref 0.00–0.07)
Basophils Absolute: 0.1 10*3/uL (ref 0.0–0.1)
Basophils Relative: 1 %
Eosinophils Absolute: 0.1 10*3/uL (ref 0.0–0.5)
Eosinophils Relative: 1 %
HCT: 38.3 % (ref 36.0–46.0)
Hemoglobin: 12.4 g/dL (ref 12.0–15.0)
Immature Granulocytes: 2 %
Lymphocytes Relative: 16 %
Lymphs Abs: 1.1 10*3/uL (ref 0.7–4.0)
MCH: 29.7 pg (ref 26.0–34.0)
MCHC: 32.4 g/dL (ref 30.0–36.0)
MCV: 91.8 fL (ref 80.0–100.0)
Monocytes Absolute: 0.9 10*3/uL (ref 0.1–1.0)
Monocytes Relative: 14 %
Neutro Abs: 4.4 10*3/uL (ref 1.7–7.7)
Neutrophils Relative %: 66 %
Platelets: 325 10*3/uL (ref 150–400)
RBC: 4.17 MIL/uL (ref 3.87–5.11)
RDW: 14.2 % (ref 11.5–15.5)
WBC: 6.7 10*3/uL (ref 4.0–10.5)
nRBC: 0 % (ref 0.0–0.2)

## 2021-05-21 LAB — RESP PANEL BY RT-PCR (FLU A&B, COVID) ARPGX2
Influenza A by PCR: NEGATIVE
Influenza B by PCR: NEGATIVE
SARS Coronavirus 2 by RT PCR: POSITIVE — AB

## 2021-05-21 LAB — TROPONIN I (HIGH SENSITIVITY)
Troponin I (High Sensitivity): 3 ng/L (ref <18)
Troponin I (High Sensitivity): 2 ng/L (ref <18)

## 2021-05-21 MED ORDER — REMDESIVIR 100 MG IV SOLR
100.0000 mg | INTRAVENOUS | Status: AC
  Administered 2021-05-22 (×2): 100 mg via INTRAVENOUS
  Filled 2021-05-21 (×2): qty 20

## 2021-05-21 MED ORDER — ALBUTEROL SULFATE (2.5 MG/3ML) 0.083% IN NEBU
2.5000 mg | INHALATION_SOLUTION | Freq: Once | RESPIRATORY_TRACT | Status: AC
  Administered 2021-05-21: 2.5 mg via RESPIRATORY_TRACT
  Filled 2021-05-21: qty 3

## 2021-05-21 MED ORDER — MAGNESIUM SULFATE 2 GM/50ML IV SOLN
2.0000 g | Freq: Once | INTRAVENOUS | Status: AC
  Administered 2021-05-21: 2 g via INTRAVENOUS
  Filled 2021-05-21: qty 50

## 2021-05-21 MED ORDER — SODIUM CHLORIDE 0.9 % IV BOLUS
1000.0000 mL | Freq: Once | INTRAVENOUS | Status: AC
  Administered 2021-05-21: 1000 mL via INTRAVENOUS

## 2021-05-21 MED ORDER — MELATONIN 3 MG PO TABS
6.0000 mg | ORAL_TABLET | Freq: Once | ORAL | Status: AC
  Administered 2021-05-22: 6 mg via ORAL
  Filled 2021-05-21: qty 2

## 2021-05-21 MED ORDER — IPRATROPIUM-ALBUTEROL 0.5-2.5 (3) MG/3ML IN SOLN
3.0000 mL | Freq: Once | RESPIRATORY_TRACT | Status: AC
  Administered 2021-05-21: 3 mL via RESPIRATORY_TRACT
  Filled 2021-05-21: qty 3

## 2021-05-21 MED ORDER — METHYLPREDNISOLONE SODIUM SUCC 125 MG IJ SOLR
125.0000 mg | Freq: Once | INTRAMUSCULAR | Status: AC
  Administered 2021-05-21: 125 mg via INTRAVENOUS
  Filled 2021-05-21: qty 2

## 2021-05-21 MED ORDER — SODIUM CHLORIDE 0.9 % IV SOLN: 100.0000 mg | Freq: Every day | INTRAVENOUS | Status: DC

## 2021-05-21 MED ORDER — ACETAMINOPHEN 325 MG PO TABS: 650.0000 mg | ORAL_TABLET | Freq: Four times a day (QID) | ORAL | Status: DC | PRN

## 2021-05-21 MED ORDER — HYDROCODONE-ACETAMINOPHEN 5-325 MG PO TABS
1.0000 | ORAL_TABLET | Freq: Once | ORAL | Status: AC
  Administered 2021-05-21: 1 via ORAL
  Filled 2021-05-21: qty 1

## 2021-05-21 MED ORDER — IBUPROFEN 800 MG PO TABS
800.0000 mg | ORAL_TABLET | Freq: Once | ORAL | Status: AC
  Administered 2021-05-21: 800 mg via ORAL
  Filled 2021-05-21: qty 1

## 2021-05-21 NOTE — H&P (Signed)
History and Physical  Kathleen [name] IOX:[id number] DOB: 11-18-1960 DOA: 05/21/2021  Referring physician: Milton Ferguson, MD  PCP: [name], MD  Patient coming from: Home  Chief Complaint: Shortness of breath  HPI: Kathleen [name] is a 60 y.o. female with medical history significant for sarcoidosis, fibromyalgia, avascular bone necrosis, Addison's disease/adrenal insufficiency, COPD on supplemental oxygen at 2 LPM, mitral valve prolapse who presents to the emergency department due to 1 day onset of shortness of breath and nonproductive cough.  She complained of weakness, loss of taste and smell and hurting all over.  She states that she  tested positive for COVID at home, so she activated EMS and she was taken to the ED for further evaluation and management.    ED Course:  In the emergency department, she was initially tachypneic and tachycardic, BP was 128/70 on arrival and O2 sat was 96% on supplemental oxygen at 4 LPM.  Work-up in the ED showed normal CBC and BMP, troponin x2-3 > 2.  Influenza A, B was negative.  SARS coronavirus 2 was positive. Chest x-ray showed mild bibasilar subsegmental atelectasis or scarring. Breathing treatment was provided, Motrin and Norco were given.  IV Solu-Medrol X1 was given.  IV hydration was provided and hospitalist was asked to admit patient for further evaluation and management.  Review of Systems: Constitutional: Negative for chills and fever.  HENT: Negative for ear pain and sore throat.   Eyes: Negative for pain and visual disturbance.  Respiratory: Positive for cough and shortness of breath.   Cardiovascular: Negative for chest pain and palpitations.  Gastrointestinal: Negative for abdominal pain and vomiting.  Endocrine: Negative for polyphagia and polyuria.  Genitourinary: Negative for decreased urine volume, dysuria, enuresis Musculoskeletal: Negative for arthralgias and back pain.  Skin: Negative for color change and rash.   Allergic/Immunologic: Negative for immunocompromised state.  Neurological: Loss of taste and smell.  Positive for headache.  Negative for tremors, syncope, speech difficulty Hematological: Does not bruise/bleed easily.  All other systems reviewed and are negative  Past Medical History:  Diagnosis Date   Adrenal insufficiency (HCC)    Anemia    Anxiety    Avascular bone necrosis (HCC)    Breast fibrocystic disorder    Chronic pain    COPD (chronic obstructive pulmonary disease) ([place])    Encounter for long-term opiate analgesic use 02/02/2020   Patient has chronic pain with femoral head avascular necrosis.  Is under pain contract   Fibromyalgia    Gastroesophageal reflux disease    Hypothyroidism    Mitral valve prolapse    Orthostatic hypotension    Peripheral neuropathy    PONV (postoperative nausea and vomiting)    Restrictive lung disease    Moderate to severe   Sarcoidosis    Biopsy proven - [facility]   SOB (shortness of breath)    chronic   Past Surgical History:  Procedure Laterality Date   BIOPSY  10/17/2018   Procedure: BIOPSY;  Surgeon: Rogene Houston, MD;  Location: [facility];  Service: Endoscopy;;  duodenum, antrum, gastric body   BIOPSY  03/03/2021   Procedure: BIOPSY;  Surgeon: Rogene Houston, MD;  Location: [facility];  Service: Endoscopy;;   BREAST LUMPECTOMY Bilateral 01/12/2011   right   CHOLECYSTECTOMY N/A 04/17/2021   Procedure: LAPAROSCOPIC CHOLECYSTECTOMY;  Surgeon: Aviva Signs, MD;  Location: [facility];  Service: General;  Laterality: N/A;   ESOPHAGEAL DILATION N/A 03/03/2021   Procedure: ESOPHAGEAL DILATION;  Surgeon: [name],  [name], MD;  Location: [facility];  Service: Endoscopy;  Laterality: N/A;   ESOPHAGOGASTRODUODENOSCOPY (EGD) WITH PROPOFOL N/A 10/17/2018   Procedure: ESOPHAGOGASTRODUODENOSCOPY (EGD) WITH PROPOFOL;  Surgeon: Rogene Houston, MD;  Location: [facility];  Service: Endoscopy;  Laterality: N/A;  2:25    ESOPHAGOGASTRODUODENOSCOPY (EGD) WITH PROPOFOL N/A 03/03/2021   Procedure: ESOPHAGOGASTRODUODENOSCOPY (EGD) WITH PROPOFOL;  Surgeon: Rogene Houston, MD;  Location: [facility];  Service: Endoscopy;  Laterality: N/A;  12:15   TUBAL LIGATION      Social History:  reports that she has never smoked. She has never used smokeless tobacco. She reports that she does not drink alcohol and does not use drugs.   Allergies  Allergen Reactions   Bee Venom Anaphylaxis   Contrast Media [Iodinated Diagnostic Agents]     CT chest w/ contrast at OSH followed by SOB resolved w/ single dose steroids, never ENT swelling, intubation or pressors, has had multiple contrasted scans since   Bactrim [Sulfamethoxazole-Trimethoprim] Nausea And Vomiting   Codeine Itching   Morphine And Related Other (See Comments)    Unknown reaction- pt gets sick, dizzy, and confused Intolerance, not an allergy, received this in the emergency department recently without a problem.    Family History  Problem Relation Age of Onset   Cardiomyopathy Mother    Breast cancer Mother    Prostate cancer Father    Neurofibromatosis Cousin    Heart disease Maternal Aunt    Bipolar disorder Daughter    Colon cancer Neg Hx      Prior to Admission medications   Medication Sig Start Date End Date Taking? Authorizing Provider  acetaminophen (TYLENOL) 500 MG tablet Take 1,000 mg by mouth every 8 (eight) hours as needed for headache.    [provider]  cholestyramine (QUESTRAN) 4 g packet Mix 1 packet as directed and take by mouth twice daily. 05/09/21   [name], [name], MD  docusate sodium (COLACE) 100 MG capsule Take 100 mg by mouth 2 (two) times daily.    [provider]  doxycycline (VIBRA-TABS) 100 MG tablet Take 1 tablet (100 mg total) by mouth 2 (two) times daily. 05/18/21   [name], MD  EPINEPHrine 0.3 mg/0.3 mL IJ SOAJ injection Inject 0.3 mLs (0.3 mg total) into the muscle as needed for anaphylaxis.  02/26/20   [name], MD  ferrous sulfate 325 (65 FE) MG EC tablet Take 325 mg by mouth daily with breakfast.    [provider]  FLUoxetine (PROZAC) 20 MG tablet Take 2 tablets by mouth daily 03/22/21   [name], MD  hydrocortisone (CORTEF) 10 MG tablet Take 2 tablets by mouth at 8:00 am and 1 tablet at lunch 05/15/21   [name], MD  hydrOXYzine (ATARAX/VISTARIL) 25 MG tablet TAKE 1 TABLET BY MOUTH 3 TIMES DAILY AS NEEDED FOR ANXIETY 03/22/21 03/22/22  [name], MD  levothyroxine (SYNTHROID) 75 MCG tablet Take 1 tablet (75 mcg total) by mouth daily before breakfast. 05/15/21 05/15/22  [name], MD  linaclotide (LINZESS) 290 MCG CAPS capsule Take 1 capsule (290 mcg total) by mouth daily before breakfast. 02/21/21   [name], [name], MD  midodrine (PROAMATINE) 5 MG tablet TAKE 1 TABLET BY MOUTH TWICE DAILY WITH A MEAL 04/21/21 04/21/22  [name], MD  ondansetron (ZOFRAN-ODT) 8 MG disintegrating tablet DISSOLVE 1 TABLET BY MOUTH EVERY 8 HOURS AS NEEDED FOR NAUSEA & VOMITING 03/22/21 03/22/22  Sallee Lange  A, MD  Oxycodone HCl 10 MG TABS Take 1 tablet by mouth every 4 hours as needed for pain(max 6 tablets daily)  **05/24/21 03/22/21   [name], MD  pantoprazole (PROTONIX) 40 MG tablet TAKE 1 TABLET BY MOUTH ONCE DAILY 04/21/21 04/21/22  [name], MD  predniSONE (DELTASONE) 5 MG tablet TAKE 2 TABLETS BY MOUTH ONCE A DAY 03/28/20 05/31/21  Patrina Levering, MD  promethazine (PHENERGAN) 25 MG suppository Unwrap and insert 1 suppository (25 mg total) rectally every 6 (six) hours as needed for nausea or vomiting. 03/22/21   [name], MD    Physical Exam: BP (!) 116/57   Pulse 68   Temp 98.8 F (37.1 C) (Oral)   Resp 18   Ht 5\' 10"  (1.778 m)   Wt 104.3 kg   LMP 05/17/2016   SpO2 95%   BMI 33.00 kg/m   General: 60 y.o. year-old female well developed well nourished in no acute distress.  Alert and oriented x3. HEENT: NCAT, EOMI Neck:  Supple, trachea medial Cardiovascular: Regular rate and rhythm with no rubs or gallops.  No thyromegaly or JVD noted.  No lower extremity edema. 2/4 pulses in all 4 extremities. Respiratory: Clear to auscultation with no wheezes or rales. Good inspiratory effort. Abdomen: Soft, nontender nondistended with normal bowel sounds x4 quadrants. Muskuloskeletal: No cyanosis, clubbing or edema noted bilaterally Neuro: CN II-XII intact, strength 5/5 x 4, sensation, reflexes intact Skin: No ulcerative lesions noted or rashes Psychiatry:  Mood is appropriate for condition and setting          Labs on Admission:  Basic Metabolic Panel: Recent Labs  Lab 05/21/21 2034  NA 140  K 3.5  CL 104  CO2 29  GLUCOSE 107*  BUN 13  CREATININE 0.90  CALCIUM 9.0   Liver Function Tests: Recent Labs  Lab 05/21/21 2034  AST 16  ALT 18  ALKPHOS 66  BILITOT 0.4  PROT 6.8  ALBUMIN 3.6   No results for input(s): LIPASE, AMYLASE in the last 168 hours. No results for input(s): AMMONIA in the last 168 hours. CBC: Recent Labs  Lab 05/21/21 2034  WBC 6.7  NEUTROABS 4.4  HGB 12.4  HCT 38.3  MCV 91.8  PLT 325   Cardiac Enzymes: No results for input(s): CKTOTAL, CKMB, CKMBINDEX, TROPONINI in the last 168 hours.  BNP (last 3 results) No results for input(s): BNP in the last 8760 hours.  ProBNP (last 3 results) No results for input(s): PROBNP in the last 8760 hours.  CBG: No results for input(s): GLUCAP in the last 168 hours.  Radiological Exams on Admission: DG Chest Port 1 View  Result Date: 05/21/2021 CLINICAL DATA:  Shortness of breath, positive COVID-19 test. EXAM: PORTABLE CHEST 1 VIEW COMPARISON:  June 28, 2020. FINDINGS: Stable cardiomediastinal silhouette. No pneumothorax is noted. Mild bibasilar subsegmental atelectasis or scarring is noted. Bony thorax is unremarkable. IMPRESSION: Mild bibasilar subsegmental atelectasis or scarring. Electronically Signed   By: Marijo Conception M.D.    On: 05/21/2021 20:39    EKG: I independently viewed the EKG done and my findings are as followed: EKG was not done in the ED  Assessment/Plan Present on Admission:  COVID-19 virus infection  Hypotension  Sarcoidosis  Avascular necrosis of bone of hip, left (HCC)  Hypothyroidism  Adrenal insufficiency (HCC)  Principal Problem:   COVID-19 virus infection Active Problems:   Sarcoidosis   Avascular necrosis of bone of hip, left ([place])  Irritable bowel syndrome with constipation   Hypotension   Adrenal insufficiency (HCC)   Acute on chronic respiratory failure with hypoxia (HCC)   Hypothyroidism  Acute on chronic respiratory failure possibly secondary to COVID-19 virus infection Patient uses supplemental oxygen at baseline possibly due to history of ILD SARS-CoV-2 virus was positive Patient was treated for COVID in November 2021 Continue albuterol q.6h Continue IV Solu-Medrol per pharmacy protocol Continue IV Remdesivir per pharmacy protocol Continue vitamin-C 500 mg p.o. Daily Continue zinc 220 mg p.o. Daily Continue Robitussin  Continue Tylenol p.r.n. for fever Continue supplemental oxygen to maintain O2 sat > or = 94% with plan to wean patient off supplemental oxygen as tolerated (of note, patient does not use oxygen at baseline) Continue incentive spirometry and flutter valve q11min as tolerated Encourage proning, early ambulation, and side laying as tolerated Continue airborne isolation precaution Continue monitoring daily inflammatory markers  Addison's disease Continue steroid as indicated above  Hypotension Continue midodrine  Hypothyroidism Continue Synthroid  Asthma/Sarcoidosis Stable  IBS with constipation Continue Linzess  Avascular necrosis of left hip Possibly induced due to chronic steroid use Continue Tylenol as needed Continue oxycodone as needed  Obesity (BMI 33.0) Patient was counseled on diet and lifestyle modification   DVT prophylaxis:  Lovenox  Code Status: Full code  Family Communication: None at bedside  Disposition Plan:  Patient is from:                        home Anticipated DC to:                   SNF or family members home Anticipated DC date:               2-3 days Anticipated DC barriers:         Patient requires inpatient management due to symptomatic COVID-19 virus infection which require IV antiviral medication  Consults called: None  Admission status: Inpatient    Bernadette Hoit MD [facility]  05/22/2021, 1:34 AM

## 2021-05-21 NOTE — ED Provider Notes (Signed)
[facility] EMERGENCY DEPARTMENT Provider Note   CSN: [id number] Arrival date & time: 05/21/21  1950     History Chief Complaint  Patient presents with   Shortness of Breath    Kathleen [name] is a 60 y.o. female.  Patient complains of fever and aches and increased shortness of breath.  She has adrenal insufficiency COPD and sarcoid.  She took a COVID test at home that was positive  The history is provided by the patient and medical records. No language interpreter was used.  Shortness of Breath Severity:  Moderate Onset quality:  Sudden Timing:  Constant Progression:  Worsening Chronicity:  Recurrent Context: activity   Relieved by:  Nothing Worsened by:  Nothing Ineffective treatments:  None tried Associated symptoms: no abdominal pain, no chest pain, no cough, no headaches and no rash       Past Medical History:  Diagnosis Date   Adrenal insufficiency (HCC)    Anemia    Anxiety    Avascular bone necrosis (HCC)    Breast fibrocystic disorder    Chronic pain    COPD (chronic obstructive pulmonary disease) ([place])    Encounter for long-term opiate analgesic use 02/02/2020   Patient has chronic pain with femoral head avascular necrosis.  Is under pain contract   Fibromyalgia    Gastroesophageal reflux disease    Hypothyroidism    Mitral valve prolapse    Orthostatic hypotension    Peripheral neuropathy    PONV (postoperative nausea and vomiting)    Restrictive lung disease    Moderate to severe   Sarcoidosis    Biopsy proven - [facility]   SOB (shortness of breath)    chronic    Patient Active Problem List   Diagnosis Date Noted   Vitamin D deficiency 05/09/2021   Diarrhea 34/74/2595   Biliary colic    Abdominal pain, epigastric 02/21/2021   Esophageal dysphagia 02/21/2021   Weakness of left foot 01/09/2021   Left foot drop 01/09/2021   Cutaneous abscess of axilla 01/09/2021   Pulmonary HTN ([place]) 10/12/2020   Scalp abscess 09/29/2020   Dysuria 09/29/2020    Encounter for long-term opiate analgesic use 02/02/2020   Acute adrenal insufficiency ([place]) 01/27/2020   Interstitial lung disease ([place]) 04/14/2019   Non-intractable vomiting 09/22/2018   Nausea vomiting and diarrhea    Acute adrenal crisis ([place]) 10/15/2017   Gastroesophageal reflux disease 10/15/2017   Personal history of noncompliance with medical treatment, presenting hazards to health 04/26/2017   Hypothyroidism 01/15/2017   Symptomatic bradycardia    Adrenal insufficiency ([place]) 08/17/2016   Hypokalemia 08/17/2016   Chronic pain syndrome 04/20/2016   Vaginal bleeding 01/02/2016   Hypovolemic shock ([place]) 12/30/2015   Nausea and vomiting 12/29/2015   Hypotension 12/29/2015   Constipation 11/14/2015   Avascular necrosis of bone of hip, left ([place]) 04/05/2015   Bimalleolar fracture of right ankle 02/11/2015   Post herpetic neuralgia 10/22/2014   Hyperlipidemia 08/25/2014   Obstructive sleep apnea 02/18/2014   Obesity, unspecified 05/06/2013   Fibromyalgia 02/11/2013   Depression 02/11/2013   Urinary retention 02/02/2013   Pedal edema 01/22/2013   Orthostatic hypotension 63/87/5643   Helicobacter pylori gastritis 05/23/2012   Fibrocystic breast disease in female 04/09/2012   Anxiety state 10/19/2010   CHEST PAIN 11/22/2008   Sarcoidosis 06/21/2008    Past Surgical History:  Procedure Laterality Date   BIOPSY  10/17/2018   Procedure: BIOPSY;  Surgeon: Rogene Houston, MD;  Location: [facility];  Service: Endoscopy;;  duodenum, antrum, gastric body   BIOPSY  03/03/2021   Procedure: BIOPSY;  Surgeon: Rogene Houston, MD;  Location: [facility];  Service: Endoscopy;;   BREAST LUMPECTOMY Bilateral 01/12/2011   right   CHOLECYSTECTOMY N/A 04/17/2021   Procedure: LAPAROSCOPIC CHOLECYSTECTOMY;  Surgeon: Aviva Signs, MD;  Location: [facility] ORS;  Service: General;  Laterality: N/A;   ESOPHAGEAL DILATION N/A 03/03/2021   Procedure: ESOPHAGEAL DILATION;  Surgeon: Rogene Houston, MD;   Location: [facility];  Service: Endoscopy;  Laterality: N/A;   ESOPHAGOGASTRODUODENOSCOPY (EGD) WITH PROPOFOL N/A 10/17/2018   Procedure: ESOPHAGOGASTRODUODENOSCOPY (EGD) WITH PROPOFOL;  Surgeon: Rogene Houston, MD;  Location: [facility];  Service: Endoscopy;  Laterality: N/A;  2:25   ESOPHAGOGASTRODUODENOSCOPY (EGD) WITH PROPOFOL N/A 03/03/2021   Procedure: ESOPHAGOGASTRODUODENOSCOPY (EGD) WITH PROPOFOL;  Surgeon: Rogene Houston, MD;  Location: [facility];  Service: Endoscopy;  Laterality: N/A;  12:15   TUBAL LIGATION       OB History     Gravida  1   Para  1   Term  1   Preterm      AB      Living         SAB      IAB      Ectopic      Multiple      Live Births              Family History  Problem Relation Age of Onset   Cardiomyopathy Mother    Breast cancer Mother    Prostate cancer Father    Neurofibromatosis Cousin    Heart disease Maternal Aunt    Bipolar disorder Daughter    Colon cancer Neg Hx     Social History   Tobacco Use   Smoking status: Never   Smokeless tobacco: Never  Vaping Use   Vaping Use: Never used  Substance Use Topics   Alcohol use: No   Drug use: No    Home Medications Prior to Admission medications   Medication Sig Start Date End Date Taking? Authorizing Provider  acetaminophen (TYLENOL) 500 MG tablet Take 1,000 mg by mouth every 8 (eight) hours as needed for headache.    [provider]  cholestyramine (QUESTRAN) 4 g packet Mix 1 packet as directed and take by mouth twice daily. 05/09/21   [name], [name], MD  docusate sodium (COLACE) 100 MG capsule Take 100 mg by mouth 2 (two) times daily.    [provider]  doxycycline (VIBRA-TABS) 100 MG tablet Take 1 tablet (100 mg total) by mouth 2 (two) times daily. 05/18/21   [name], MD  EPINEPHrine 0.3 mg/0.3 mL IJ SOAJ injection Inject 0.3 mLs (0.3 mg total) into the muscle as needed for anaphylaxis. 02/26/20   [name], MD  ferrous  sulfate 325 (65 FE) MG EC tablet Take 325 mg by mouth daily with breakfast.    [provider]  FLUoxetine (PROZAC) 20 MG tablet Take 2 tablets by mouth daily 03/22/21   [name], MD  hydrocortisone (CORTEF) 10 MG tablet Take 2 tablets by mouth at 8:00 am and 1 tablet at lunch 05/15/21   [name], MD  hydrOXYzine (ATARAX/VISTARIL) 25 MG tablet TAKE 1 TABLET BY MOUTH 3 TIMES DAILY AS NEEDED FOR ANXIETY 03/22/21 03/22/22  [name], MD  levothyroxine (SYNTHROID) 75 MCG tablet Take 1 tablet (75 mcg total) by mouth daily before breakfast. 05/15/21 05/15/22  Nida,  Marella Chimes, MD  linaclotide [name]) 290 MCG CAPS capsule Take 1 capsule (290 mcg total) by mouth daily before breakfast. 02/21/21   [name], [name], MD  midodrine (PROAMATINE) 5 MG tablet TAKE 1 TABLET BY MOUTH TWICE DAILY WITH A MEAL 04/21/21 04/21/22  [name], MD  ondansetron (ZOFRAN-ODT) 8 MG disintegrating tablet DISSOLVE 1 TABLET BY MOUTH EVERY 8 HOURS AS NEEDED FOR NAUSEA & VOMITING 03/22/21 03/22/22  [name], MD  Oxycodone HCl 10 MG TABS Take 1 tablet by mouth every 4 hours as needed for pain(max 6 tablets daily)  **05/24/21 03/22/21   [name], MD  pantoprazole (PROTONIX) 40 MG tablet TAKE 1 TABLET BY MOUTH ONCE DAILY 04/21/21 04/21/22  [name], MD  predniSONE (DELTASONE) 5 MG tablet TAKE 2 TABLETS BY MOUTH ONCE A DAY 03/28/20 05/31/21  Patrina Levering, MD  promethazine (PHENERGAN) 25 MG suppository Unwrap and insert 1 suppository (25 mg total) rectally every 6 (six) hours as needed for nausea or vomiting. 03/22/21   [name], MD    Allergies    Bee venom, Contrast media [iodinated diagnostic agents], Bactrim [sulfamethoxazole-trimethoprim], Codeine, and Morphine and related  Review of Systems   Review of Systems  Constitutional:  Negative for appetite change and fatigue.  HENT:  Negative for congestion, ear discharge and sinus pressure.   Eyes:  Negative for discharge.   Respiratory:  Positive for shortness of breath. Negative for cough.   Cardiovascular:  Negative for chest pain.  Gastrointestinal:  Negative for abdominal pain and diarrhea.  Genitourinary:  Negative for frequency and hematuria.  Musculoskeletal:  Negative for back pain.  Skin:  Negative for rash.  Neurological:  Negative for seizures and headaches.  Psychiatric/Behavioral:  Negative for hallucinations.    Physical Exam Updated Vital Signs BP (!) 106/55 (BP Location: Left Arm)   Pulse 95   Temp 98.7 F (37.1 C) (Oral)   Resp (!) 22   Ht 5\' 10"  (1.778 m)   Wt 104.3 kg   LMP 05/17/2016   SpO2 96%   BMI 33.00 kg/m   Physical Exam Vitals and nursing note reviewed.  Constitutional:      Appearance: She is well-developed.  HENT:     Head: Normocephalic.     Nose: Nose normal.  Eyes:     General: No scleral icterus.    Conjunctiva/sclera: Conjunctivae normal.  Neck:     Thyroid: No thyromegaly.  Cardiovascular:     Rate and Rhythm: Normal rate and regular rhythm.     Heart sounds: No murmur heard.   No friction rub. No gallop.  Pulmonary:     Breath sounds: No stridor. No wheezing or rales.  Chest:     Chest wall: No tenderness.  Abdominal:     General: There is no distension.     Tenderness: There is no abdominal tenderness. There is no rebound.  Musculoskeletal:        General: Normal range of motion.     Cervical back: Neck supple.  Lymphadenopathy:     Cervical: No cervical adenopathy.  Skin:    Findings: No erythema or rash.  Neurological:     Mental Status: She is alert and oriented to person, place, and time.     Motor: No abnormal muscle tone.     Coordination: Coordination normal.  Psychiatric:        Behavior: Behavior normal.    ED Results / Procedures / Treatments   Labs (all  labs ordered are listed, but only abnormal results are displayed) Labs Reviewed  RESP PANEL BY RT-PCR (FLU A&B, COVID) ARPGX2 - Abnormal; Notable for the following  components:      Result Value   SARS Coronavirus 2 by RT PCR POSITIVE (*)    All other components within normal limits  CBC WITH DIFFERENTIAL/PLATELET - Abnormal; Notable for the following components:   Abs Immature Granulocytes 0.13 (*)    All other components within normal limits  COMPREHENSIVE METABOLIC PANEL - Abnormal; Notable for the following components:   Glucose, Bld 107 (*)    All other components within normal limits  TROPONIN I (HIGH SENSITIVITY)    EKG None  Radiology DG Chest Port 1 View  Result Date: 05/21/2021 CLINICAL DATA:  Shortness of breath, positive COVID-19 test. EXAM: PORTABLE CHEST 1 VIEW COMPARISON:  June 28, 2020. FINDINGS: Stable cardiomediastinal silhouette. No pneumothorax is noted. Mild bibasilar subsegmental atelectasis or scarring is noted. Bony thorax is unremarkable. IMPRESSION: Mild bibasilar subsegmental atelectasis or scarring. Electronically Signed   By: Marijo Conception M.D.   On: 05/21/2021 20:39    Procedures Procedures   Medications Ordered in ED Medications  ibuprofen (ADVIL) tablet 800 mg (has no administration in time range)  magnesium sulfate IVPB 2 g 50 mL (0 g Intravenous Stopped 05/21/21 2143)  ipratropium-albuterol (DUONEB) 0.5-2.5 (3) MG/3ML nebulizer solution 3 mL (3 mLs Nebulization Given 05/21/21 2042)  albuterol (PROVENTIL) (2.5 MG/3ML) 0.083% nebulizer solution 2.5 mg (2.5 mg Nebulization Given 05/21/21 2042)  HYDROcodone-acetaminophen (NORCO/VICODIN) 5-325 MG per tablet 1 tablet (1 tablet Oral Given 05/21/21 2117)  sodium chloride 0.9 % bolus 1,000 mL (1,000 mLs Intravenous New Bag/Given 05/21/21 2219)  methylPREDNISolone sodium succinate (SOLU-MEDROL) 125 mg/2 mL injection 125 mg (125 mg Intravenous Given 05/21/21 2215)    ED Course  I have reviewed the triage vital signs and the nursing notes.  Pertinent labs & imaging results that were available during my care of the patient were reviewed by me and considered in my medical  decision making (see chart for details). CRITICAL CARE Performed by: Milton Ferguson Total critical care time: 40 minutes Critical care time was exclusive of separately billable procedures and treating other patients. Critical care was necessary to treat or prevent imminent or life-threatening deterioration. Critical care was time spent personally by me on the following activities: development of treatment plan with patient and/or surrogate as well as nursing, discussions with consultants, evaluation of patient's response to treatment, examination of patient, obtaining history from patient or surrogate, ordering and performing treatments and interventions, ordering and review of laboratory studies, ordering and review of radiographic studies, pulse oximetry and re-evaluation of patient's condition.    MDM Rules/Calculators/A&P                           Patient with new onset COVID.  Also patient with exacerbation of her COPD.  She will be admitted to medicine Final Clinical Impression(s) / ED Diagnoses Final diagnoses:  COPD exacerbation ([place])  COVID-19    Rx / DC Orders ED Discharge Orders     None        Milton Ferguson, MD 05/21/21 2226

## 2021-05-21 NOTE — ED Triage Notes (Signed)
Patient brought in be [facility] for shortness of breath.  Patient took an at home COVID test that came back positive.  Patient is on 4 lpm at home and EMS states she has been at 100%.  Patient states that she is hurting all over.

## 2021-05-22 DIAGNOSIS — F419 Anxiety disorder, unspecified: Secondary | ICD-10-CM | POA: Diagnosis present

## 2021-05-22 DIAGNOSIS — E2740 Unspecified adrenocortical insufficiency: Secondary | ICD-10-CM | POA: Diagnosis present

## 2021-05-22 DIAGNOSIS — U071 COVID-19: Principal | ICD-10-CM

## 2021-05-22 DIAGNOSIS — J9611 Chronic respiratory failure with hypoxia: Secondary | ICD-10-CM | POA: Diagnosis present

## 2021-05-22 DIAGNOSIS — F32A Depression, unspecified: Secondary | ICD-10-CM | POA: Diagnosis present

## 2021-05-22 DIAGNOSIS — F418 Other specified anxiety disorders: Secondary | ICD-10-CM | POA: Diagnosis present

## 2021-05-22 LAB — COMPREHENSIVE METABOLIC PANEL
ALT: 17 U/L (ref 0–44)
AST: 18 U/L (ref 15–41)
Albumin: 3.2 g/dL — ABNORMAL LOW (ref 3.5–5.0)
Alkaline Phosphatase: 62 U/L (ref 38–126)
Anion gap: 7 (ref 5–15)
BUN: 13 mg/dL (ref 6–20)
CO2: 27 mmol/L (ref 22–32)
Calcium: 8.7 mg/dL — ABNORMAL LOW (ref 8.9–10.3)
Chloride: 106 mmol/L (ref 98–111)
Creatinine, Ser: 0.95 mg/dL (ref 0.44–1.00)
GFR, Estimated: >60 mL/min (ref >60)
Glucose, Bld: 161 mg/dL — ABNORMAL HIGH (ref 70–99)
Potassium: 4.4 mmol/L (ref 3.5–5.1)
Sodium: 140 mmol/L (ref 135–145)
Total Bilirubin: 0.4 mg/dL (ref 0.3–1.2)
Total Protein: 6.1 g/dL — ABNORMAL LOW (ref 6.5–8.1)

## 2021-05-22 LAB — CBC WITH DIFFERENTIAL/PLATELET
Abs Immature Granulocytes: 0.12 10*3/uL — ABNORMAL HIGH (ref 0.00–0.07)
Basophils Absolute: 0 10*3/uL (ref 0.0–0.1)
Basophils Relative: 1 %
Eosinophils Absolute: 0.5 10*3/uL (ref 0.0–0.5)
Eosinophils Relative: 7 %
HCT: 37.2 % (ref 36.0–46.0)
Hemoglobin: 11.6 g/dL — ABNORMAL LOW (ref 12.0–15.0)
Immature Granulocytes: 2 %
Lymphocytes Relative: 6 %
Lymphs Abs: 0.4 10*3/uL — ABNORMAL LOW (ref 0.7–4.0)
MCH: 28.9 pg (ref 26.0–34.0)
MCHC: 31.2 g/dL (ref 30.0–36.0)
MCV: 92.8 fL (ref 80.0–100.0)
Monocytes Absolute: 0.1 10*3/uL (ref 0.1–1.0)
Monocytes Relative: 2 %
Neutro Abs: 5.9 10*3/uL (ref 1.7–7.7)
Neutrophils Relative %: 82 %
Platelets: 310 10*3/uL (ref 150–400)
RBC: 4.01 MIL/uL (ref 3.87–5.11)
RDW: 14.2 % (ref 11.5–15.5)
WBC: 7 10*3/uL (ref 4.0–10.5)
nRBC: 0 % (ref 0.0–0.2)

## 2021-05-22 LAB — PHOSPHORUS: Phosphorus: 4.3 mg/dL (ref 2.5–4.6)

## 2021-05-22 LAB — CBG MONITORING, ED
Glucose-Capillary: 150 mg/dL — ABNORMAL HIGH (ref 70–99)
Glucose-Capillary: 99 mg/dL (ref 70–99)

## 2021-05-22 LAB — FERRITIN: Ferritin: 82 ng/mL (ref 11–307)

## 2021-05-22 LAB — MAGNESIUM: Magnesium: 2.6 mg/dL — ABNORMAL HIGH (ref 1.7–2.4)

## 2021-05-22 LAB — C-REACTIVE PROTEIN: CRP: 2.1 mg/dL — ABNORMAL HIGH (ref <1.0)

## 2021-05-22 LAB — HIV ANTIBODY (ROUTINE TESTING W REFLEX): HIV Screen 4th Generation wRfx: NONREACTIVE

## 2021-05-22 LAB — D-DIMER, QUANTITATIVE: D-Dimer, Quant: 0.28 ug/mL-FEU (ref 0.00–0.50)

## 2021-05-22 MED ORDER — ASCORBIC ACID 500 MG PO TABS: 500.0000 mg | ORAL_TABLET | Freq: Every day | ORAL | 0 refills | Status: DC

## 2021-05-22 MED ORDER — ZINC SULFATE 220 (50 ZN) MG PO CAPS
220.0000 mg | ORAL_CAPSULE | Freq: Every day | ORAL | Status: DC
  Administered 2021-05-22: 220 mg via ORAL
  Filled 2021-05-22: qty 1

## 2021-05-22 MED ORDER — ALBUTEROL SULFATE HFA 108 (90 BASE) MCG/ACT IN AERS: 2.0000 | INHALATION_SPRAY | Freq: Four times a day (QID) | RESPIRATORY_TRACT | 1 refills | Status: DC | PRN

## 2021-05-22 MED ORDER — PAXLOVID (300/100) 20 X 150 MG & 10 X 100MG PO TBPK: 150.0000 | ORAL_TABLET | ORAL | 0 refills | Status: DC

## 2021-05-22 MED ORDER — LEVOTHYROXINE SODIUM 50 MCG PO TABS: 75.0000 ug | ORAL_TABLET | Freq: Every day | ORAL | Status: DC

## 2021-05-22 MED ORDER — PANTOPRAZOLE SODIUM 40 MG PO TBEC
40.0000 mg | DELAYED_RELEASE_TABLET | Freq: Every day | ORAL | Status: DC
  Administered 2021-05-22: 40 mg via ORAL
  Filled 2021-05-22: qty 1

## 2021-05-22 MED ORDER — REMDESIVIR 100 MG IV SOLR: 100.0000 mg | Freq: Once | INTRAVENOUS | Status: DC

## 2021-05-22 MED ORDER — OXYCODONE HCL 5 MG PO TABS
5.0000 mg | ORAL_TABLET | Freq: Once | ORAL | Status: AC
  Administered 2021-05-22: 5 mg via ORAL
  Filled 2021-05-22: qty 1

## 2021-05-22 MED ORDER — ENOXAPARIN SODIUM 60 MG/0.6ML IJ SOSY
50.0000 mg | PREFILLED_SYRINGE | Freq: Every day | INTRAMUSCULAR | Status: DC
  Administered 2021-05-22: 50 mg via SUBCUTANEOUS
  Filled 2021-05-22: qty 0.6

## 2021-05-22 MED ORDER — PREDNISONE 50 MG PO TABS: 50.0000 mg | ORAL_TABLET | Freq: Every day | ORAL | Status: DC

## 2021-05-22 MED ORDER — MIDODRINE HCL 5 MG PO TABS
5.0000 mg | ORAL_TABLET | Freq: Two times a day (BID) | ORAL | Status: DC
  Administered 2021-05-22: 5 mg via ORAL
  Filled 2021-05-22: qty 1

## 2021-05-22 MED ORDER — ASCORBIC ACID 500 MG PO TABS
500.0000 mg | ORAL_TABLET | Freq: Every day | ORAL | Status: DC
  Administered 2021-05-22: 500 mg via ORAL
  Filled 2021-05-22: qty 1

## 2021-05-22 MED ORDER — OXYCODONE HCL 5 MG PO TABS
5.0000 mg | ORAL_TABLET | ORAL | Status: DC | PRN
Start: 2021-05-22 — End: 2021-05-22
  Administered 2021-05-22 (×2): 5 mg via ORAL
  Filled 2021-05-22 (×3): qty 1

## 2021-05-22 MED ORDER — METHYLPREDNISOLONE SODIUM SUCC 125 MG IJ SOLR: 0.5000 mg/kg | Freq: Two times a day (BID) | INTRAMUSCULAR | Status: DC

## 2021-05-22 MED ORDER — INSULIN ASPART 100 UNIT/ML IJ SOLN
0.0000 [IU] | Freq: Three times a day (TID) | INTRAMUSCULAR | Status: DC
  Administered 2021-05-22: 2 [IU] via SUBCUTANEOUS
  Filled 2021-05-22: qty 1

## 2021-05-22 MED ORDER — LEVOTHYROXINE SODIUM 50 MCG PO TABS
75.0000 ug | ORAL_TABLET | Freq: Every day | ORAL | Status: DC
  Administered 2021-05-22: 75 ug via ORAL
  Filled 2021-05-22: qty 2

## 2021-05-22 MED ORDER — SODIUM CHLORIDE 0.9 % IV SOLN: 200.0000 mg | Freq: Once | INTRAVENOUS | Status: DC

## 2021-05-22 MED ORDER — SODIUM CHLORIDE 0.9 % IV SOLN: 100.0000 mg | Freq: Every day | INTRAVENOUS | Status: DC

## 2021-05-22 MED ORDER — ZINC SULFATE 220 (50 ZN) MG PO CAPS: 220.0000 mg | ORAL_CAPSULE | Freq: Every day | ORAL | 0 refills | Status: DC

## 2021-05-22 MED ORDER — ACETAMINOPHEN 325 MG PO TABS: 650.0000 mg | ORAL_TABLET | Freq: Four times a day (QID) | ORAL | Status: DC | PRN

## 2021-05-22 MED ORDER — GUAIFENESIN-DM 100-10 MG/5ML PO SYRP: 10.0000 mL | ORAL_SOLUTION | ORAL | 0 refills | Status: DC | PRN

## 2021-05-22 MED ORDER — LINACLOTIDE 145 MCG PO CAPS
290.0000 ug | ORAL_CAPSULE | Freq: Every day | ORAL | Status: DC
  Administered 2021-05-22: 290 ug via ORAL
  Filled 2021-05-22: qty 2

## 2021-05-22 MED ORDER — METHYLPREDNISOLONE SODIUM SUCC 125 MG IJ SOLR
0.5000 mg/kg | Freq: Two times a day (BID) | INTRAMUSCULAR | Status: DC
  Administered 2021-05-22: 51.875 mg via INTRAVENOUS
  Filled 2021-05-22: qty 2

## 2021-05-22 MED ORDER — GUAIFENESIN-DM 100-10 MG/5ML PO SYRP
10.0000 mL | ORAL_SOLUTION | ORAL | Status: DC | PRN
  Administered 2021-05-22: 10 mL via ORAL
  Filled 2021-05-22: qty 10

## 2021-05-22 MED ORDER — SODIUM CHLORIDE 0.9 % IV SOLN: 100.0000 mg | Freq: Once | INTRAVENOUS | Status: DC

## 2021-05-22 MED ORDER — PREDNISONE 50 MG PO TABS: 50.0000 mg | ORAL_TABLET | Freq: Every day | ORAL | 0 refills | Status: DC

## 2021-05-22 MED ORDER — ALBUTEROL SULFATE HFA 108 (90 BASE) MCG/ACT IN AERS
2.0000 | INHALATION_SPRAY | Freq: Four times a day (QID) | RESPIRATORY_TRACT | Status: DC
  Administered 2021-05-22 (×3): 2 via RESPIRATORY_TRACT
  Filled 2021-05-22: qty 6.7

## 2021-05-22 NOTE — Discharge Summary (Signed)
Kathleen [name], is a 60 y.o. female  DOB 1960/10/17  MRN [id number].  Admission date:  05/21/2021  Admitting Physician  Bernadette Hoit, DO  Discharge Date:  05/22/2021   Primary MD  [name], MD  Recommendations for primary care physician for things to follow:   1)Avoid ibuprofen/Advil/Aleve/Motrin/Goody Powders/Naproxen/BC powders/Meloxicam/Diclofenac/Indomethacin and other Nonsteroidal anti-inflammatory medications as these will make you more likely to bleed and can cause stomach ulcers, can also cause Kidney problems.   2)Please Take Paxlovid as prescribed for COVID-19 infection  3)Please continue supplemental oxygen at 2 L via nasal cannula as prescribed--  4) You are strongly advised to isolate/quarantine for at least 10 days from the date of your diagnosis with COVID-19 infection--please always wear a mask if you have to go outside the house  5)Video/Virtual follow-up visit with primary care physician or Pulmonologist  in about a week advised  6) please follow-up with endocrinologist Dr. Loni Beckwith, MD, Endocrinology, Diabetes & Metabolism-- [facility], [address] [place], [place] [address], Phone Number- [phone]----to discuss the appropriate dosage of your hydrocortisone and prednisone medications  7) you have been prescribed prednisone 50 mg daily for 5 days, you advised to hold your hydrocortisone/Cortef while taking prednisone 50 mg for 5 days, please resume your hydrocortisone/Cortef once you complete this prednisone treatment   Admission Diagnosis  COVID-19 virus infection [U07.1]   Discharge Diagnosis  COVID-19 virus infection [U07.1]    Principal Problem:   COVID-19 virus infection Active Problems:   Chronic respiratory failure with hypoxia (HCC)   Sarcoidosis   Chronic adrenal insufficiency (HCC)   Anxiety and depression   Obesity, unspecified   Obstructive  sleep apnea   Avascular necrosis of bone of hip, left (HCC)   Irritable bowel syndrome with constipation   Chronic pain syndrome   Hypothyroidism   Personal history of noncompliance with medical treatment, presenting hazards to health   GERD (gastroesophageal reflux disease)   Interstitial lung disease ([place])   Pulmonary HTN ([place])      Past Medical History:  Diagnosis Date   Adrenal insufficiency ([place])    Anemia    Anxiety    Avascular bone necrosis ([place])    Breast fibrocystic disorder    Chronic pain    COPD (chronic obstructive pulmonary disease) ([place])    Encounter for long-term opiate analgesic use 02/02/2020   Patient has chronic pain with femoral head avascular necrosis.  Is under pain contract   Fibromyalgia    Gastroesophageal reflux disease    Hypothyroidism    Mitral valve prolapse    Orthostatic hypotension    Peripheral neuropathy    PONV (postoperative nausea and vomiting)    Restrictive lung disease    Moderate to severe   Sarcoidosis    Biopsy proven - [facility]   SOB (shortness of breath)    chronic    Past Surgical History:  Procedure Laterality Date   BIOPSY  10/17/2018   Procedure: BIOPSY;  Surgeon: Rogene Houston, MD;  Location[facility];  Service: Endoscopy;;  duodenum, antrum, gastric body   BIOPSY  03/03/2021   Procedure: BIOPSY;  Surgeon: Rogene Houston, MD;  [facility];  Service: Endoscopy;;   BREAST LUMPECTOMY Bilateral 01/12/2011   right   CHOLECYSTECTOMY N/A 04/17/2021   Procedure: LAPAROSCOPIC CHOLECYSTECTOMY;  Surgeon: Aviva Signs, MD;  Location: [facility] ORS;  Service: General;  Laterality: N/A;   ESOPHAGEAL DILATION N/A 03/03/2021   Procedure: ESOPHAGEAL DILATION;  Surgeon: Rogene Houston, MD;  [facility];  Service: Endoscopy;  Laterality: N/A;   ESOPHAGOGASTRODUODENOSCOPY (EGD) WITH PROPOFOL N/A 10/17/2018   Procedure: ESOPHAGOGASTRODUODENOSCOPY (EGD) WITH PROPOFOL;  Surgeon: Rogene Houston, MD;  [facility];  Service: Endoscopy;  Laterality: N/A;  2:25   ESOPHAGOGASTRODUODENOSCOPY (EGD) WITH PROPOFOL N/A 03/03/2021   Procedure: ESOPHAGOGASTRODUODENOSCOPY (EGD) WITH PROPOFOL;  Surgeon: Rogene Houston, MD;  [facility];  Service: Endoscopy;  Laterality: N/A;  12:15   TUBAL LIGATION       HPI  from the history and physical done on the day of admission:   Chief Complaint: Shortness of breath   HPI: Kathleen [name] is a 60 y.o. female with medical history significant for sarcoidosis, fibromyalgia, avascular bone necrosis, Addison's disease/adrenal insufficiency, COPD on supplemental oxygen at 2 LPM, mitral valve prolapse who presents to the emergency department due to 1 day onset of shortness of breath and nonproductive cough.  She complained of weakness, loss of taste and smell and hurting all over.  She states that she  tested positive for COVID at home, so she activated EMS and she was taken to the ED for further evaluation and management.     ED Course:  In the emergency department, she was initially tachypneic and tachycardic, BP was 128/70 on arrival and O2 sat was 96% on supplemental oxygen at 4 LPM.  Work-up in the ED showed normal CBC and BMP, troponin x2-3 > 2.  Influenza A, B was negative.  SARS coronavirus 2 was positive. Chest x-ray showed mild bibasilar subsegmental atelectasis or scarring. Breathing treatment was provided, Motrin and Norco were given.  IV Solu-Medrol X1 was given.  IV hydration was provided and hospitalist was asked to admit patient for further evaluation and management.       Hospital Course:   1)Covid 56 Infection--chest x-ray without new acute findings, patient does have subsegmental atelectasis and possibly scarring from prior infection -Oxygen saturation on 2 L which is patient's baseline at home is 96 to 98%- -Patient ambulated around the room to the commode and back to bed without increased work of breathing or dyspnea on exertion or  without desaturation -Patient is speaking in full sentences taking off her oxygen from time to time without desaturating - -Patient is concerned about being discharged home apparently back in November 2021 she had COVID-19 infection requiring intubation for respiratory failure at that time -Patient is chronically on steroids due to adrenal insufficiency and patient has chronic lung disease and chronic hypoxic respiratory failure on 2 L of oxygen at home - -I explained to patient that right now at this time her respiratory status is stable even with activity -I also told patient that I cannot predict the future she may decompensate down the road from COVID-19 infection, but at this time she is stable for discharge home -Patient did receive IV remdesivir -I have discharge patient home on Paxlovid--- a medications were reviewed with pharmacist, there is no incompatibility with this particular medication -Inflammatory markers  noted--- D-dimer: 0.28, ferritin is only 82 which is normal, WBC is normal at 6.7 , troponin is normal at 2 ,CRP is 2.1  2) chronic adrenal insufficiency----patient appears to be taking hydrocortisone and prednisone PTA -We will discharge on prednisone 50 mg daily for 5 days for COVID-19 infection, patient to restart hydrocortisone after finishing the prednisone -She is strongly advised to follow-up with endocrinologist Dr. Loni Beckwith--- for further management of her chronic adrenal insufficiency and adjustment of her steroids  3) hypothyroidism--- stable continue levothyroxine, follow-up with Dr. Loni Beckwith  4) chronic pain syndrome/avascular necrosis of the left hip--- avascular necrosis probably induced by chronic steroid therapy -Continue PT opiates for pain control  5)H/o asthma/ILD/sarcoidosis----continue steroids and follow-up with pulmonologist as outpatient -No increased oxygen requirement at this time  6) chronic hypotension--- continue  hydrocortisone and midodrine  Social/Ethics--- patient is very very unhappy about being discharged home, she was told that she could appeal her discharge if she wanted to, patient states she does not really want to appeal at this time -Despite multiple attempts to reassure patient that her current respiratory status is stable she remains very unhappy, she was yelling, talking nonstop without conversational dyspnea or desaturation--again patient was ambulating from the bed to the commode and back and forth without significant respiratory distress -Nurse Tech Joretta Bachelor was present during my interview and exam of patient in ED room 7 -ED charge not lately and the director of [place]--- went into try to talk to patient --Patient very anxious -Patient threatening to come back to the ED in a.m. - -  Discharge Condition: stable  Follow UP   Follow-up Information     [name], MD. Schedule an appointment as soon as possible for a visit in 1 week(s).   Specialty: Family Medicine Why: Video visit Contact information: [place] [address] [phone]         [name], MD. Schedule an appointment as soon as possible for a visit in 2 week(s).   Specialty: Endocrinology Why: steroid rx for adrenal insufficiency Contact information: [place] [address] [phone]                  Consults obtained - na  Diet and Activity recommendation:  As advised  Discharge Instructions    Discharge Instructions     Call MD for:  difficulty breathing, headache or visual disturbances   Complete by: As directed    Call MD for:  persistant dizziness or light-headedness   Complete by: As directed    Call MD for:  persistant nausea and vomiting   Complete by: As directed    Call MD for:  temperature >100.4   Complete by: As directed    Diet - low sodium heart healthy   Complete by: As directed    Discharge  instructions   Complete by: As directed    1)Avoid ibuprofen/Advil/Aleve/Motrin/Goody Powders/Naproxen/BC powders/Meloxicam/Diclofenac/Indomethacin and other Nonsteroidal anti-inflammatory medications as these will make you more likely to bleed and can cause stomach ulcers, can also cause Kidney problems.   2)Please Take Paxlovid as prescribed for COVID-19 infection  3)Please continue supplemental oxygen at 2 L via nasal cannula as prescribed--  4) You are strongly advised to isolate/quarantine for at least 10 days from the date of your diagnosis with COVID-19 infection--please always wear a mask if you have to go outside the house  5)Video/Virtual follow-up visit with primary care physician or Pulmonologist  in about a week advised  6) please follow-up with endocrinologist Dr. Loni Beckwith, MD, Endocrinology, Diabetes & Metabolism-- [facility], [place], [place] [address], Phone Number- [phone]----to discuss the appropriate dosage of your hydrocortisone and prednisone medications  7) you have been prescribed prednisone 50 mg daily for 5 days, you advised to hold your hydrocortisone/Cortef while taking prednisone 50 mg for 5 days, please resume your hydrocortisone/Cortef once you complete this prednisone treatment   Increase activity slowly   Complete by: As directed          Discharge Medications     Allergies as of 05/22/2021       Reactions   Bee Venom Anaphylaxis   Contrast Media [iodinated Diagnostic Agents]    CT chest w/ contrast at OSH followed by SOB resolved w/ single dose steroids, never ENT swelling, intubation or pressors, has had multiple contrasted scans since   Bactrim [sulfamethoxazole-trimethoprim] Nausea And Vomiting   Codeine Itching   Morphine And Related Other (See Comments)   Unknown reaction- pt gets sick, dizzy, and confused Intolerance, not an allergy, received this in the emergency department recently without a problem.         Medication List     STOP taking these medications    doxycycline 100 MG tablet Commonly known as: VIBRA-TABS   ondansetron 8 MG disintegrating tablet Commonly known as: ZOFRAN-ODT   promethazine 25 MG suppository Commonly known as: PHENERGAN       TAKE these medications    acetaminophen 500 MG tablet Commonly known as: TYLENOL Take 1,000 mg by mouth every 8 (eight) hours as needed for headache.   albuterol 108 (90 Base) MCG/ACT inhaler Commonly known as: VENTOLIN HFA Inhale 2 puffs into the lungs every 6 (six) hours as needed for wheezing or shortness of breath.   ascorbic acid 500 MG tablet Commonly known as: VITAMIN C Take 1 tablet (500 mg total) by mouth daily. Start taking on: May 23, 2021   cholestyramine 4 g packet Commonly known as: QUESTRAN Mix 1 packet as directed and take by mouth twice daily.   docusate sodium 100 MG capsule Commonly known as: COLACE Take 100 mg by mouth daily.   EPINEPHrine 0.3 mg/0.3 mL Soaj injection Commonly known as: EPI-PEN Inject 0.3 mLs (0.3 mg total) into the muscle as needed for anaphylaxis.   ferrous sulfate 325 (65 FE) MG EC tablet Take 325 mg by mouth daily with breakfast.   FLUoxetine 20 MG tablet Commonly known as: PROZAC Take 2 tablets by mouth daily   guaiFENesin-dextromethorphan 100-10 MG/5ML syrup Commonly known as: ROBITUSSIN DM Take 10 mLs by mouth every 4 (four) hours as needed for cough.   hydrocortisone 10 MG tablet Commonly known as: CORTEF Take 2 tablets by mouth at 8:00 am and 1 tablet at lunch   hydrOXYzine 25 MG tablet Commonly known as: ATARAX/VISTARIL TAKE 1 TABLET BY MOUTH 3 TIMES DAILY AS NEEDED FOR ANXIETY   levothyroxine 75 MCG tablet Commonly known as: SYNTHROID Take 1 tablet (75 mcg total) by mouth daily before breakfast.   Linzess 290 MCG Caps capsule Generic drug: linaclotide Take 1 capsule (290 mcg total) by mouth daily before breakfast.   midodrine 5 MG tablet Commonly  known as: PROAMATINE TAKE 1 TABLET BY MOUTH TWICE DAILY WITH A MEAL   Oxycodone HCl 10 MG Tabs Take 1 tablet by mouth every 4 hours as needed for pain(max 6 tablets daily)  **05/24/21   pantoprazole 40 MG tablet Commonly  known as: PROTONIX TAKE 1 TABLET BY MOUTH ONCE DAILY   Paxlovid (300/100) 20 x 150 MG & 10 x 100MG  Tbpk Generic drug: nirmatrelvir & ritonavir Take 150 tablets by mouth See admin instructions.   predniSONE 50 MG tablet Commonly known as: DELTASONE Take 1 tablet (50 mg total) by mouth daily with breakfast. Start taking on: May 23, 2021 What changed:  medication strength how much to take when to take this   zinc sulfate 220 (50 Zn) MG capsule Take 1 capsule (220 mg total) by mouth daily. Start taking on: May 23, 2021        Major procedures and Radiology Reports - PLEASE review detailed and final reports for all details, in brief -DG Chest Port 1 View  Result Date: 05/21/2021 CLINICAL DATA:  Shortness of breath, positive COVID-19 test. EXAM: PORTABLE CHEST 1 VIEW COMPARISON:  June 28, 2020. FINDINGS: Stable cardiomediastinal silhouette. No pneumothorax is noted. Mild bibasilar subsegmental atelectasis or scarring is noted. Bony thorax is unremarkable. IMPRESSION: Mild bibasilar subsegmental atelectasis or scarring. Electronically Signed   By: Marijo Conception M.D.   On: 05/21/2021 20:39    Micro Results   Recent Results (from the past 240 hour(s))  Resp Panel by RT-PCR (Flu A&B, Covid) Nasopharyngeal Swab     Status: Abnormal   Collection Time: 05/21/21  8:22 PM   Specimen: Nasopharyngeal Swab; Nasopharyngeal(NP) swabs in vial transport medium  Result Value Ref Range Status   SARS Coronavirus 2 by RT PCR POSITIVE (A) NEGATIVE Final    Comment: RESULT CALLED TO, READ BACK BY AND VERIFIED WITH: PRUITT,G @ 2158 ON 05/21/21 BY JUW (NOTE) SARS-CoV-2 target nucleic acids are DETECTED.  The SARS-CoV-2 RNA is generally detectable in upper  respiratory specimens during the acute phase of infection. Positive results are indicative of the presence of the identified virus, but do not rule out bacterial infection or co-infection with other pathogens not detected by the test. Clinical correlation with patient history and other diagnostic information is necessary to determine patient infection status. The expected result is Negative.  Fact Sheet for Patients: EntrepreneurPulse.com.au  Fact Sheet for Healthcare Providers: IncredibleEmployment.be  This test is not yet approved or cleared by the [place] FDA and  has been authorized for detection and/or diagnosis of SARS-CoV-2 by FDA under an Emergency Use Authorization (EUA).  This EUA will remain in effect (meaning this test can be  used) for the duration of  the COVID-19 declaration under Section 564(b)(1) of the Act, 21 U.S.C. section 360bbb-3(b)(1), unless the authorization is terminated or revoked sooner.     Influenza A by PCR NEGATIVE NEGATIVE Final   Influenza B by PCR NEGATIVE NEGATIVE Final    Comment: (NOTE) The Xpert Xpress SARS-CoV-2/FLU/RSV plus assay is intended as an aid in the diagnosis of influenza from Nasopharyngeal swab specimens and should not be used as a sole basis for treatment. Nasal washings and aspirates are unacceptable for Xpert Xpress SARS-CoV-2/FLU/RSV testing.  Fact Sheet for Patients: EntrepreneurPulse.com.au  Fact Sheet for Healthcare Providers: IncredibleEmployment.be  This test is not yet approved or cleared by the [place] FDA and has been authorized for detection and/or diagnosis of SARS-CoV-2 by FDA under an Emergency Use Authorization (EUA). This EUA will remain in effect (meaning this test can be used) for the duration of the COVID-19 declaration under Section 564(b)(1) of the Act, 21 U.S.C. section 360bbb-3(b)(1), unless the authorization is  terminated or revoked.  Performed at [facility], [address] [place],  [place] [address]        Today   Subjective    Kathleen [name] today has No fevers-  patient is very very unhappy about being discharged home, she was told that she could appeal her discharge if she wanted to, patient states she does not really want to appeal at this time -Despite multiple attempts to reassure patient that her current respiratory status is stable she remains very unhappy, she was yelling, talking nonstop without conversational dyspnea or desaturation--again patient was ambulating from the bed to the commode and back and forth without significant respiratory distress -Nurse [place] was present during my interview and exam of patient in ED room 7 -ED charge not lately and the director of [place]--- went into try to talk to patient --Patient very anxious -Patient threatening to come back to the ED in a.m. -       Patient has been seen and examined prior to discharge   Objective   Blood pressure 136/65, pulse 65, temperature 98 F (36.7 C), temperature source Oral, resp. rate (!) 26, height 5\' 10"  (1.778 m), weight 104.3 kg, last menstrual period 05/17/2016, SpO2 99 %.   Intake/Output Summary (Last 24 hours) at 05/22/2021 1308 Last data filed at 05/21/2021 2143 Gross per 24 hour  Intake 50 ml  Output --  Net 50 ml    Exam Gen:- Awake Alert, no acute distress , no conversational dyspnea, no dyspnea on exertion when ambulating around the room from commode to bed and back to commode HEENT:- [place].AT, No sclera icterus Nose - 2L/min Neck-Supple Neck,No JVD,.  Lungs-fair air movement, no wheeze  CV- S1, S2 normal, regular Abd-  +ve B.Sounds, Abd Soft, No tenderness, increased truncal adiposity Extremity/Skin:- No  edema,   good pulses Psych-affect is anxious, oriented x3 Neuro-no new focal deficits, no tremors    Data Review   CBC w Diff:  Lab Results  Component  Value Date   WBC 7.0 05/22/2021   HGB 11.6 (L) 05/22/2021   HGB 12.6 08/07/2018   HCT 37.2 05/22/2021   HCT 36.9 08/07/2018   PLT 310 05/22/2021   PLT 359 08/07/2018   LYMPHOPCT 6 05/22/2021   MONOPCT 2 05/22/2021   EOSPCT 7 05/22/2021   BASOPCT 1 05/22/2021    CMP:  Lab Results  Component Value Date   NA 140 05/22/2021   NA 140 05/08/2021   K 4.4 05/22/2021   CL 106 05/22/2021   CO2 27 05/22/2021   BUN 13 05/22/2021   BUN 13 05/08/2021   CREATININE 0.95 05/22/2021   CREATININE 0.85 08/26/2014   PROT 6.1 (L) 05/22/2021   PROT 6.6 05/08/2021   ALBUMIN 3.2 (L) 05/22/2021   ALBUMIN 4.4 05/08/2021   BILITOT 0.4 05/22/2021   BILITOT 0.2 05/08/2021   ALKPHOS 62 05/22/2021   AST 18 05/22/2021   ALT 17 05/22/2021  .   Total Discharge time is about 33 minutes  Roxan Hockey M.D on 05/22/2021 at 1:08 PM  Go to [email or web address] -  for contact info  [facility] - Office  [phone]

## 2021-05-22 NOTE — ED Notes (Signed)
Husband brought pt breakfast. Nurse handed food to pt.

## 2021-05-22 NOTE — ED Notes (Signed)
@  1100 pt needed assistance this NT went in pt room to clean bed and replace bed sheets. As this NT was in pt room Dr. Joesph Fillers entered room to discuss pts health and the next steps. As Dr. Joesph Fillers was talking to pt the pt became very angry and was being verbally rude to Dr.Courage about her condition. Dr courage explained to the pt that her health was fine and that she was okay to be discharged from the hospital. During that time the pt stated that she would return tomorrow if she was discharged.

## 2021-05-22 NOTE — ED Notes (Signed)
During discharge pt verbalized she had a white blanket with her from EMS and did not know which one of the blankets on the bed was hers. Pt looked and then said " I don't know which white blanket it was but it was nice and not yellow". Nurse offered Pt one of the white blankets from bed and asked pt is she satisfied with one of those as a white blanket or did she want [place] to look for a white blanket? Pt verbalized, " no, this is fine , it doesn't matter anyway. Upon helping pt into spouses truck , pt jumped up from wheelchair so fast to get into truck nurse and husband had to say " wooo wait a minute , your oxygen tube". Pt was able to get onto the side step of the truck and pull herself into the seat without any problems.

## 2021-05-22 NOTE — ED Notes (Addendum)
Pt refused to sign discharge papers and stated she was going to start telling everyone to stop signing any paperwork at this hospital.

## 2021-05-22 NOTE — ED Notes (Addendum)
Husband to see pt

## 2021-05-22 NOTE — ED Notes (Signed)
Pt received lunch tray 

## 2021-05-22 NOTE — ED Notes (Addendum)
Pt upset and yelling at nurse stating, " this will not end well" " I should not be sent home". Pt is on 2 lpm and oxygen is 99%.

## 2021-05-22 NOTE — ED Notes (Signed)
Nurse called husband for ride.

## 2021-05-22 NOTE — Discharge Instructions (Signed)
1)Avoid ibuprofen/Advil/Aleve/Motrin/Goody Powders/Naproxen/BC powders/Meloxicam/Diclofenac/Indomethacin and other Nonsteroidal anti-inflammatory medications as these will make you more likely to bleed and can cause stomach ulcers, can also cause Kidney problems.   2)Please Take Paxlovid as prescribed for COVID-19 infection  3)Please continue supplemental oxygen at 2 L via nasal cannula as prescribed--  4) You are strongly advised to isolate/quarantine for at least 10 days from the date of your diagnosis with COVID-19 infection--please always wear a mask if you have to go outside the house  5)Video/Virtual follow-up visit with primary care physician or Pulmonologist  in about a week advised  6) please follow-up with endocrinologist Dr. Loni Beckwith, MD, Endocrinology, Diabetes & Metabolism-- [facility], [address] [place], [place] [address], Phone Number- [phone]----to discuss the appropriate dosage of your hydrocortisone and prednisone medications  7) you have been prescribed prednisone 50 mg daily for 5 days, you advised to hold your hydrocortisone/Cortef while taking prednisone 50 mg for 5 days, please resume your hydrocortisone/Cortef once you complete this prednisone treatment

## 2021-05-22 NOTE — ED Notes (Signed)
Pt is above 97 % on 2 lpm without any distress. Pt is able to express very passionately without any shortness of breath her opinion of her stay here.

## 2021-05-22 NOTE — ED Notes (Addendum)
Pt on phone with sister yelling about " I am different than everyone else." I need to appeal discharge.

## 2021-05-23 ENCOUNTER — Other Ambulatory Visit (HOSPITAL_COMMUNITY): Payer: Self-pay

## 2021-05-23 ENCOUNTER — Other Ambulatory Visit: Payer: Self-pay | Admitting: Family Medicine

## 2021-05-24 ENCOUNTER — Other Ambulatory Visit: Payer: Self-pay | Admitting: *Deleted

## 2021-05-24 ENCOUNTER — Ambulatory Visit: Payer: 59 | Admitting: Family Medicine

## 2021-05-24 ENCOUNTER — Other Ambulatory Visit (HOSPITAL_COMMUNITY): Payer: Self-pay

## 2021-05-24 MED ORDER — HYDROXYZINE HCL 25 MG PO TABS
ORAL_TABLET | Freq: Three times a day (TID) | ORAL | 5 refills | Status: DC | PRN
  Filled 2021-05-24: qty 30, 10d supply, fill #0
  Filled 2021-06-05: qty 30, 10d supply, fill #1

## 2021-05-24 NOTE — Patient Outreach (Signed)
[facility]) Care Management  05/24/2021  JAKYIA GACCIONE Oct 09, 1960 [id number]   Transition of care telephone call  Referral received:05/22/21 Initial outreach:05/24/21 Insurance: [facility]   Initial unsuccessful telephone call to patient's preferred number in order to complete transition of care assessment; no answer, left HIPAA compliant voicemail message requesting return call.   Objective: Per the electronic medical record,Mrs. Clorene Nerio was hospitalized at [facility] ED  05/21/21 for shortness of breath,fever, body aches,  positive Covid 19,  she was discharged from the ED Comorbidities include: Interstitial lung disease, Sacrcoidosis, Pulmonary hypertension, chronic adrenal insuffiency, Avascular necrosis of hip bone left, Hyperlipidemia, Covid 19, Anxiety and Depression.  She was discharged to home on 04/2521 without the need for home health services or DME, to contine with home oxygen at 2 liters.   Plan: This RNCM will route unsuccessful outreach letter with [place] Management pamphlet and 24 hour Nurse Advice Line Magnet to [facility] clinical pool to be mailed to patient's home address. This RNCM will attempt another outreach within 4 business days.   Joylene Draft, RN, BSN  [place] Management Coordinator  [phone]- Mobile [phone]- Toll Free Main Office

## 2021-05-29 ENCOUNTER — Telehealth: Payer: Self-pay | Admitting: "Endocrinology

## 2021-05-29 ENCOUNTER — Other Ambulatory Visit: Payer: Self-pay | Admitting: *Deleted

## 2021-05-29 ENCOUNTER — Other Ambulatory Visit (HOSPITAL_COMMUNITY): Payer: Self-pay

## 2021-05-29 ENCOUNTER — Encounter: Payer: Self-pay | Admitting: *Deleted

## 2021-05-29 ENCOUNTER — Other Ambulatory Visit: Payer: Self-pay | Admitting: "Endocrinology

## 2021-05-29 MED ORDER — HYDROCORTISONE 10 MG PO TABS
ORAL_TABLET | ORAL | 0 refills | Status: DC
  Filled 2021-05-29: qty 90, 30d supply, fill #0

## 2021-05-29 NOTE — Telephone Encounter (Signed)
Pt states she needs a refill because she was not given enough ( she only was given 30 tablets ). Please advise.

## 2021-05-29 NOTE — Telephone Encounter (Signed)
Pt called back, received 30 tablets and instructions are 2 at 8am and 1 at lunch. 3 total tablets a day. This only lasted her 10 days and she is out. Pt is needing a refill.  hydrocortisone (CORTEF) 10 MG tablet   [facility] Phone:  [phone]  Fax:  [phone]

## 2021-05-29 NOTE — Telephone Encounter (Signed)
Correct amount of medication was sent to the pharmacy requested by patient.

## 2021-05-29 NOTE — Patient Outreach (Signed)
[facility]) Care Management  05/29/2021  Kathleen [name] 1961/01/03 [id number]

## 2021-05-29 NOTE — Patient Outreach (Addendum)
[facility]) Care Management  05/29/2021  Kathleen [name] 29-Apr-1961 [id number]   Transition of care call/case closure   Referral received: 05/22/21 Initial outreach:05/24/21 Insurance: [employer]    Subjective: 2nd attempt successful telephone call to patient's preferred number in order to complete transition of care assessment; 2 HIPAA identifiers verified. Explained purpose of call and completed transition of care assessment.  States that she is not doing much better, but no worse bothered my cough, denies fever, reports still having body aches and taking tylenol as needed. She reports continuing to wear oxygen at 2 liters  reports having pulse oximeter and saturations mostly greater than 90. She states that she was not admitted to hospital . Patient reports completing prednisone as prescribed, continuing paxlovid . Reinforced notifying MD , pulmonary for post discharge follow up and concern if worsening in condition. She reports still being on isolation 10 days after diagnosis . Reviewed virtual visits with providers as an option. She reports being in contact with endocrinology office by phone regarding medications. She states she will follow up sooner She reports tolerating diet, denies bowel or bladder problems. She reports tolerating activity in home as prior to admission using rolling walker.   Spouse is assisting with her recovery.  Discussed  additional education, resource denies needs , she is agreeable to follow up call in the next week.   Reviewed accessing the following [employer] : She discussed ongoing health issues Sarcoidosis  and says she  does not need a referral to one of the [place] chronic disease management programs.  She uses a [facility] outpatient pharmacy at [place].   Objective:  Per the electronic medical record,Kathleen [name] was hospitalized at [facility] ED  05/21/21 for shortness of breath,fever,  body aches,  positive Covid 19,  she was discharged from the ED Comorbidities include: Interstitial lung disease, Sacrcoidosis, Pulmonary hypertension, chronic adrenal insuffiency, Avascular necrosis of hip bone left, Hyperlipidemia, Covid 19, Anxiety and Depression.  She was discharged to home on 04/2521 without the need for home health services or DME, to contine with home oxygen at 2 liters  Assessment:  Patient voices good understanding of all discharge instructions.  See transition of care flowsheet for assessment details.   Plan:  Reviewed hospital discharge diagnosis of Covid 19 and discharge treatment plan using hospital discharge instructions, assessing medication adherence, reviewing problems requiring provider notification, and discussing the importance of follow up with surgeon, primary care provider and/or specialists as directed. Reinforced follow up appointments as recommended at discharge, offered assistance. Reinforced worsening symptoms to notify providers and seek medical attention for sooner.   Patient is agreeable to call in the next week for follow up on current condition and assess for ongoing care coordination needs.   Joylene Draft, RN, BSN  [place] Management Coordinator  [phone]- Mobile [phone]- Toll Free Main Office

## 2021-05-31 ENCOUNTER — Other Ambulatory Visit (HOSPITAL_COMMUNITY): Payer: Self-pay

## 2021-06-05 ENCOUNTER — Other Ambulatory Visit (HOSPITAL_COMMUNITY): Payer: Self-pay

## 2021-06-06 ENCOUNTER — Other Ambulatory Visit: Payer: Self-pay | Admitting: *Deleted

## 2021-06-06 NOTE — Patient Outreach (Addendum)
    [facility]) Care Management  06/06/2021  CAMELA WICH 22-Nov-1960 [id number]   Transition of care/follow up case closure   Referral received: 05/22/21 Initial outreach:05/24/21 Insurance: [employer] Successful outreach call to patient, she reports still recovering from covid 19 . She discussed last night wasn't as good. She reports still having some aches, cough denies having a fever , still wearing oxygen at 2 liters. She reports resting better if sleeping in recliner chair.  She discussed tolerating usual mobility in home using walker, spouse assist as needed with meal prep.  Discussed with patient recovery from covid 19, could takes weeks to recover to usual strength , reinforced keeping all upcoming appointments, notifying MD for worsening symptoms, staying hydrated and nutrition intake. She denies ongoing questions, education  or resources needs Discussed with patient [place] ongoing chronic condition program with active health management she declines benefit of telephonic outreaches.  She reports having upcoming appointments with PCP and pulmonary reinforced attending appointments,and will have transportation.  At end of call patient admits that she is now driving  I urged ending call for safety.  Objective Per the electronic medical record,Mrs. Sonjia Wilcoxson was hospitalized at [facility] ED  05/21/21 for shortness of breath,fever, body aches,  positive Covid 19,  she was discharged from the ED Comorbidities include: Interstitial lung disease, Sacrcoidosis, Pulmonary hypertension, chronic adrenal insuffiency, Avascular necrosis of hip bone left, Hyperlipidemia, Covid 19, Anxiety and Depression.  She was discharged to home on 04/2521 without the need for home health services or DME, to contine with home oxygen at 2 liters    Plan Patient denies ongoing care management needs , will close to [facility] care management .    Joylene Draft,  RN, BSN  [place] Management Coordinator  [phone]- Mobile [phone]- Toll Free Main Office

## 2021-06-07 ENCOUNTER — Other Ambulatory Visit (HOSPITAL_COMMUNITY): Payer: Self-pay

## 2021-06-08 ENCOUNTER — Other Ambulatory Visit (HOSPITAL_COMMUNITY): Payer: Self-pay

## 2021-06-08 MED ORDER — BUDESONIDE 0.5 MG/2ML IN SUSP
RESPIRATORY_TRACT | 6 refills | Status: DC
  Filled 2021-06-08: qty 120, 30d supply, fill #0

## 2021-06-09 ENCOUNTER — Other Ambulatory Visit (HOSPITAL_COMMUNITY): Payer: Self-pay

## 2021-06-09 DIAGNOSIS — J441 Chronic obstructive pulmonary disease with (acute) exacerbation: Secondary | ICD-10-CM | POA: Diagnosis not present

## 2021-06-09 DIAGNOSIS — J4541 Moderate persistent asthma with (acute) exacerbation: Secondary | ICD-10-CM | POA: Diagnosis not present

## 2021-06-14 ENCOUNTER — Ambulatory Visit (INDEPENDENT_AMBULATORY_CARE_PROVIDER_SITE_OTHER): Payer: 59 | Admitting: Family Medicine

## 2021-06-14 ENCOUNTER — Other Ambulatory Visit: Payer: Self-pay

## 2021-06-14 ENCOUNTER — Other Ambulatory Visit (HOSPITAL_COMMUNITY): Payer: Self-pay

## 2021-06-14 VITALS — BP 128/73 | HR 73 | Temp 97.2°F | Ht 70.0 in | Wt 239.0 lb

## 2021-06-14 DIAGNOSIS — F419 Anxiety disorder, unspecified: Secondary | ICD-10-CM | POA: Diagnosis not present

## 2021-06-14 DIAGNOSIS — G894 Chronic pain syndrome: Secondary | ICD-10-CM | POA: Diagnosis not present

## 2021-06-14 DIAGNOSIS — F32A Depression, unspecified: Secondary | ICD-10-CM | POA: Diagnosis not present

## 2021-06-14 DIAGNOSIS — J849 Interstitial pulmonary disease, unspecified: Secondary | ICD-10-CM | POA: Diagnosis not present

## 2021-06-14 DIAGNOSIS — R300 Dysuria: Secondary | ICD-10-CM

## 2021-06-14 DIAGNOSIS — M87052 Idiopathic aseptic necrosis of left femur: Secondary | ICD-10-CM

## 2021-06-14 MED ORDER — FLUOXETINE HCL 20 MG PO CAPS
ORAL_CAPSULE | ORAL | 3 refills | Status: DC
  Filled 2021-06-14: qty 270, 90d supply, fill #0
  Filled 2021-09-08: qty 270, 90d supply, fill #1
  Filled 2021-12-01: qty 270, 90d supply, fill #2
  Filled 2022-03-07: qty 270, 90d supply, fill #3

## 2021-06-14 MED ORDER — OXYCODONE HCL 10 MG PO TABS
ORAL_TABLET | ORAL | 0 refills | Status: DC
  Filled 2021-06-14: qty 180, fill #0
  Filled 2021-06-22: qty 180, 30d supply, fill #0

## 2021-06-14 MED ORDER — HYDROXYZINE HCL 25 MG PO TABS
ORAL_TABLET | Freq: Three times a day (TID) | ORAL | 5 refills | Status: DC | PRN
  Filled 2021-06-14: qty 90, 30d supply, fill #0
  Filled 2021-07-14: qty 90, 30d supply, fill #1
  Filled 2021-08-14: qty 90, 30d supply, fill #2
  Filled 2021-09-08: qty 90, 30d supply, fill #3
  Filled 2021-10-06: qty 90, 30d supply, fill #4
  Filled 2021-11-03: qty 90, 30d supply, fill #5

## 2021-06-14 NOTE — Progress Notes (Signed)
   Subjective:    Patient ID: Kathleen [name], female    DOB: 1961/01/24, 60 y.o.   MRN: [id number]  HPI Abscess under both arms, x 1 week - have subsided  She relates it has abscess under both arms was placed on antibiotics now doing much better.  She denies any fever chills or sweats  Recently had COVID and unfortunately suffering with post COVID symptoms of shortness of breath as well as fatigue tiredness.  She states her O2 saturations range anywhere from the low 90s to the upper 80s she relates she has seen her specialist recently  She also has chronic pain is due for chronic pain visit we will schedule that next month but will give her 1 additional prescription she states she is not exceeding 6/day and denies misusing it states it does allow her to function better  Drug registry was checked  She also has panic and anxiety issues for which she is being treated with a combination of Prozac and hydroxyzine.  Benzodiazepines would not be appropriate.  She states she still has a fair amount of panic attacks and anxiousness related to her underlying  Review of Systems     Objective:   Physical Exam Lungs clear respiratory rate normal heart regular pulse normal extremities no edema Area underneath the axilla looks like it is healing well      Assessment & Plan:  1. Dysuria We will do a urine culture.  I do not see any obvious infection under the microscope.  If culture comes back positive we will treat with antibiotics - Urine Culture  2. Interstitial lung disease ([place]) Followed by specialist.  Utilizes oxygen on a as needed basis.  3. Avascular necrosis of bone of hip, left (HCC) Continue pain medication as directed.  Do not exceed 6/day.  Follow-up next month for pain management visit  4. Anxiety and depression Moderate anxiety and depression symptoms with panic attacks associated with her underlying lung disease recommend bumping up the dose of Prozac to 3/day follow-up again  next month hydroxyzine no more than 3/day stay away from benzodiazepines  5. Chronic pain syndrome Pain medication 1 refill given to do a formal visit next month with urine drug screen.

## 2021-06-15 ENCOUNTER — Telehealth: Payer: Self-pay | Admitting: Family Medicine

## 2021-06-15 NOTE — Telephone Encounter (Signed)
Results of urine culture is not back yet- ordered yesterday- Per Dr Bary Leriche office note- We will do a urine culture.  Do not see any obvious infection under the microscope.  If culture comes back positive we will treat with antibiotics    Left message to return call

## 2021-06-15 NOTE — Telephone Encounter (Signed)
Patient asked about recent urine test and asked if any meds were called in.  [facility]  [phone]

## 2021-06-16 NOTE — Telephone Encounter (Signed)
Patient advised per Dr Bary Leriche note: Results of urine culture is not back yet- ordered yesterday- Per Dr Bary Leriche office note- We will do a urine culture.  Do not see any obvious infection under the microscope.  If culture comes back positive we will treat with antibiotics  Patient verbalized understanding.

## 2021-06-17 LAB — URINE CULTURE

## 2021-06-18 DIAGNOSIS — M6281 Muscle weakness (generalized): Secondary | ICD-10-CM | POA: Diagnosis not present

## 2021-06-18 DIAGNOSIS — J849 Interstitial pulmonary disease, unspecified: Secondary | ICD-10-CM | POA: Diagnosis not present

## 2021-06-18 DIAGNOSIS — D860 Sarcoidosis of lung: Secondary | ICD-10-CM | POA: Diagnosis not present

## 2021-06-18 DIAGNOSIS — J9601 Acute respiratory failure with hypoxia: Secondary | ICD-10-CM | POA: Diagnosis not present

## 2021-06-19 ENCOUNTER — Other Ambulatory Visit (HOSPITAL_COMMUNITY): Payer: Self-pay

## 2021-06-19 NOTE — Telephone Encounter (Signed)
See result note please If further questions concerns let me know

## 2021-06-20 NOTE — Telephone Encounter (Addendum)
Patient has been informed per lab result notes. Patient denies any ongoing concerns / symptoms at this time.

## 2021-06-21 ENCOUNTER — Other Ambulatory Visit (HOSPITAL_COMMUNITY): Payer: Self-pay

## 2021-06-21 DIAGNOSIS — I959 Hypotension, unspecified: Secondary | ICD-10-CM | POA: Diagnosis not present

## 2021-06-21 DIAGNOSIS — R0602 Shortness of breath: Secondary | ICD-10-CM | POA: Diagnosis not present

## 2021-06-21 DIAGNOSIS — J9601 Acute respiratory failure with hypoxia: Secondary | ICD-10-CM | POA: Diagnosis not present

## 2021-06-21 DIAGNOSIS — D860 Sarcoidosis of lung: Secondary | ICD-10-CM | POA: Diagnosis not present

## 2021-06-21 DIAGNOSIS — J849 Interstitial pulmonary disease, unspecified: Secondary | ICD-10-CM | POA: Diagnosis not present

## 2021-06-21 DIAGNOSIS — M6281 Muscle weakness (generalized): Secondary | ICD-10-CM | POA: Diagnosis not present

## 2021-06-21 DIAGNOSIS — Z8616 Personal history of COVID-19: Secondary | ICD-10-CM | POA: Diagnosis not present

## 2021-06-21 MED ORDER — BUDESONIDE 0.5 MG/2ML IN SUSP
RESPIRATORY_TRACT | 11 refills | Status: DC
  Filled 2021-06-21: qty 120, 30d supply, fill #0

## 2021-06-21 MED ORDER — FORMOTEROL FUMARATE 20 MCG/2ML IN NEBU
INHALATION_SOLUTION | RESPIRATORY_TRACT | 11 refills | Status: DC
  Filled 2021-06-21: qty 120, 30d supply, fill #0

## 2021-06-21 MED ORDER — PREDNISONE 5 MG PO TABS
ORAL_TABLET | ORAL | 11 refills | Status: DC
  Filled 2021-06-21: qty 30, 30d supply, fill #0
  Filled 2021-08-17: qty 30, 30d supply, fill #1
  Filled 2021-09-08: qty 30, 30d supply, fill #2
  Filled 2021-10-06: qty 30, 30d supply, fill #3
  Filled 2021-11-03: qty 30, 30d supply, fill #4
  Filled 2021-12-11: qty 30, 30d supply, fill #5
  Filled 2022-01-10: qty 30, 30d supply, fill #6
  Filled 2022-02-05: qty 30, 30d supply, fill #7
  Filled 2022-03-07: qty 30, 30d supply, fill #8
  Filled 2022-04-06: qty 30, 30d supply, fill #9
  Filled 2022-05-08: qty 30, 30d supply, fill #10
  Filled 2022-06-08: qty 30, 30d supply, fill #11

## 2021-06-22 ENCOUNTER — Other Ambulatory Visit (HOSPITAL_COMMUNITY): Payer: Self-pay

## 2021-06-26 ENCOUNTER — Other Ambulatory Visit (HOSPITAL_COMMUNITY): Payer: Self-pay

## 2021-06-26 ENCOUNTER — Ambulatory Visit (INDEPENDENT_AMBULATORY_CARE_PROVIDER_SITE_OTHER): Payer: 59 | Admitting: Gastroenterology

## 2021-06-26 ENCOUNTER — Other Ambulatory Visit: Payer: Self-pay | Admitting: "Endocrinology

## 2021-06-26 MED FILL — Hydrocortisone Tab 10 MG: ORAL | 30 days supply | Qty: 90 | Fill #0 | Status: AC

## 2021-06-27 ENCOUNTER — Ambulatory Visit (INDEPENDENT_AMBULATORY_CARE_PROVIDER_SITE_OTHER): Payer: 59 | Admitting: Obstetrics & Gynecology

## 2021-06-27 ENCOUNTER — Encounter: Payer: Self-pay | Admitting: Obstetrics & Gynecology

## 2021-06-27 ENCOUNTER — Other Ambulatory Visit: Payer: Self-pay

## 2021-06-27 ENCOUNTER — Other Ambulatory Visit (HOSPITAL_COMMUNITY): Payer: Self-pay

## 2021-06-27 VITALS — BP 116/73 | HR 77 | Ht 70.0 in | Wt 236.5 lb

## 2021-06-27 DIAGNOSIS — R309 Painful micturition, unspecified: Secondary | ICD-10-CM | POA: Diagnosis not present

## 2021-06-27 DIAGNOSIS — R102 Pelvic and perineal pain: Secondary | ICD-10-CM | POA: Diagnosis not present

## 2021-06-27 DIAGNOSIS — R350 Frequency of micturition: Secondary | ICD-10-CM | POA: Diagnosis not present

## 2021-06-27 DIAGNOSIS — R300 Dysuria: Secondary | ICD-10-CM | POA: Diagnosis not present

## 2021-06-27 LAB — POCT URINALYSIS DIPSTICK
Glucose, UA: NEGATIVE
Ketones, UA: NEGATIVE
Nitrite, UA: NEGATIVE
Protein, UA: NEGATIVE

## 2021-06-27 MED ORDER — SOLIFENACIN SUCCINATE 10 MG PO TABS
10.0000 mg | ORAL_TABLET | Freq: Every day | ORAL | 11 refills | Status: DC
  Filled 2021-06-27 – 2021-07-07 (×2): qty 30, 30d supply, fill #0

## 2021-06-27 NOTE — Progress Notes (Signed)
Chief Complaint  Patient presents with   pelvic pain, feels bloated, frequent urination      60 y.o. G1P1000 Patient's last menstrual period was 05/17/2016. The current method of family planning is post menopausal status.     Subjective Pt with pelvic cramping since her gallbladder removal No vaginal bleeding for several years Urinary frequency and also loses urine when she can't make it to the bathroom 2-3 times per week Gets up 4-5 times per night Recent urine culture was negative  Past Medical History:  Diagnosis Date   Adrenal insufficiency (HCC)    Anemia    Anxiety    Avascular bone necrosis (HCC)    Breast fibrocystic disorder    Chronic pain    COPD (chronic obstructive pulmonary disease) ([place])    Encounter for long-term opiate analgesic use 02/02/2020   Patient has chronic pain with femoral head avascular necrosis.  Is under pain contract   Fibromyalgia    Gastroesophageal reflux disease    Hypothyroidism    Mitral valve prolapse    Orthostatic hypotension    Peripheral neuropathy    PONV (postoperative nausea and vomiting)    Restrictive lung disease    Moderate to severe   Sarcoidosis    Biopsy proven - [facility]   SOB (shortness of breath)    chronic    Past Surgical History:  Procedure Laterality Date   BIOPSY  10/17/2018   Procedure: BIOPSY;  Surgeon: Rogene Houston, MD;  [facility];  Service: Endoscopy;;  duodenum, antrum, gastric body   BIOPSY  03/03/2021   Procedure: BIOPSY;  Surgeon: Rogene Houston, MD;  [facility];  Service: Endoscopy;;   BREAST LUMPECTOMY Bilateral 01/12/2011   right   CHOLECYSTECTOMY N/A 04/17/2021   Procedure: LAPAROSCOPIC CHOLECYSTECTOMY;  Surgeon: Aviva Signs, MD;  Location: [facility];  Service: General;  Laterality: N/A;   ESOPHAGEAL DILATION N/A 03/03/2021   Procedure: ESOPHAGEAL DILATION;  Surgeon: Rogene Houston, MD;  [facility];  Service: Endoscopy;  Laterality: N/A;    ESOPHAGOGASTRODUODENOSCOPY (EGD) WITH PROPOFOL N/A 10/17/2018   Procedure: ESOPHAGOGASTRODUODENOSCOPY (EGD) WITH PROPOFOL;  Surgeon: Rogene Houston, MD;  [facility];  Service: Endoscopy;  Laterality: N/A;  2:25   ESOPHAGOGASTRODUODENOSCOPY (EGD) WITH PROPOFOL N/A 03/03/2021   Procedure: ESOPHAGOGASTRODUODENOSCOPY (EGD) WITH PROPOFOL;  Surgeon: Rogene Houston, MD;  [facility];  Service: Endoscopy;  Laterality: N/A;  12:15   TUBAL LIGATION      OB History     Gravida  1   Para  1   Term  1   Preterm      AB      Living         SAB      IAB      Ectopic      Multiple      Live Births              Allergies  Allergen Reactions   Bee Venom Anaphylaxis   Contrast Media [Iodinated Diagnostic Agents]     CT chest w/ contrast at OSH followed by SOB resolved w/ single dose steroids, never ENT swelling, intubation or pressors, has had multiple contrasted scans since   Bactrim [Sulfamethoxazole-Trimethoprim] Nausea And Vomiting   Codeine Itching   Morphine And Related Other (See Comments)    Unknown reaction- pt gets sick, dizzy, and confused Intolerance, not an allergy, received this in the emergency department  recently without a problem.    Social History   Socioeconomic History   Marital status: Married    Spouse name: Not on file   Number of children: 1   Years of education: Not on file   Highest education level: Not on file  Occupational History   Occupation: Equities trader Payable    Employer: [employer]  Tobacco Use   Smoking status: Never   Smokeless tobacco: Never  Vaping Use   Vaping Use: Never used  Substance and Sexual Activity   Alcohol use: No   Drug use: No   Sexual activity: Yes    Birth control/protection: Post-menopausal  Other Topics Concern   Not on file  Social History Narrative   Daily caffeine    Living with husband   Right handed   She has been on disability since 2014.    Social Determinants of Health    Financial Resource Strain: Not on file  Food Insecurity: Not on file  Transportation Needs: Not on file  Physical Activity: Not on file  Stress: Not on file  Social Connections: Not on file    Family History  Problem Relation Age of Onset   Cardiomyopathy Mother    Breast cancer Mother    Prostate cancer Father    Neurofibromatosis Cousin    Heart disease Maternal Aunt    Bipolar disorder Daughter    Colon cancer Neg Hx     Medications:           Objective Blood pressure 116/73, pulse 77, height 5\' 10"  (1.778 m), weight 236 lb 8 oz (107.3 kg), last menstrual period 05/17/2016.  General WDWN female NAD Abdomen is benign non tender Vulva:  normal appearing vulva with no masses, tenderness or lesions Vagina:  normal mucosa, no discharge Cervix:  Normal no lesions Uterus:  normal size, contour, position, consistency, mobility, non-tender Adnexa: ovaries:present,  normal adnexa in size, nontender and no masses   Pertinent ROS  No nausea, vomiting or diarrhea Nor fever chills or other constitutional symptoms   Labs or studies reviewed    Impression Diagnoses this Encounter::   ICD-10-CM   1. Pelvic cramping  R10.2 US PELVIS (TRANSABDOMINAL ONLY)    US PELVIS TRANSVAGINAL NON-OB (TV ONLY)    US PELVIS TRANSVAGINAL NON-OB (TV ONLY)    US PELVIS (TRANSABDOMINAL ONLY)    2. Urinary frequency  R35.0 POCT Urinalysis Dipstick    Urine Culture    3. Burning with urination  R30.0 POCT Urinalysis Dipstick    Urine Culture    4. Pain with urination  R30.9 POCT Urinalysis Dipstick    Urine Culture      Established relevant diagnosis(es):   Plan/Recommendations: Meds ordered this encounter  Medications   solifenacin (VESICARE) 10 MG tablet    Sig: Take 1 tablet (10 mg total) by mouth daily.    Dispense:  30 tablet    Refill:  11    Labs or Scans Ordered: Orders Placed This Encounter  Procedures   Urine Culture   US PELVIS (TRANSABDOMINAL ONLY)   US  PELVIS TRANSVAGINAL NON-OB (TV ONLY)   POCT Urinalysis Dipstick    Management:: Trial of vesicare Pelvic sonogram, crampong I believe is GI related post lap chole  Follow up Return if symptoms worsen or fail to improve.     All questions were answered.

## 2021-07-02 LAB — URINE CULTURE

## 2021-07-03 ENCOUNTER — Other Ambulatory Visit (HOSPITAL_COMMUNITY): Payer: Self-pay

## 2021-07-05 ENCOUNTER — Other Ambulatory Visit (HOSPITAL_COMMUNITY): Payer: Self-pay

## 2021-07-06 ENCOUNTER — Ambulatory Visit (HOSPITAL_COMMUNITY)
Admission: RE | Admit: 2021-07-06 | Discharge: 2021-07-06 | Disposition: A | Discharge status: Home / Self Care | Payer: 59 | Source: Ambulatory Visit | Attending: Obstetrics & Gynecology | Admitting: Obstetrics & Gynecology

## 2021-07-06 ENCOUNTER — Other Ambulatory Visit (HOSPITAL_COMMUNITY): Payer: Self-pay

## 2021-07-06 ENCOUNTER — Other Ambulatory Visit: Payer: Self-pay

## 2021-07-06 DIAGNOSIS — R102 Pelvic and perineal pain: Secondary | ICD-10-CM | POA: Diagnosis not present

## 2021-07-07 ENCOUNTER — Telehealth: Payer: Self-pay | Admitting: Obstetrics & Gynecology

## 2021-07-07 ENCOUNTER — Other Ambulatory Visit (HOSPITAL_COMMUNITY): Payer: Self-pay

## 2021-07-07 NOTE — Telephone Encounter (Signed)
Called pharmacy to confirm receipt of medication. Prescription is there. Pharmacy will call pt and fill.

## 2021-07-07 NOTE — Telephone Encounter (Signed)
Patient calling stating that the script for solifenacin has not been received at [facility] pharmacy and is asking if it can be resent

## 2021-07-08 ENCOUNTER — Encounter: Payer: Self-pay | Admitting: Emergency Medicine

## 2021-07-08 ENCOUNTER — Ambulatory Visit
Admission: EM | Admit: 2021-07-08 | Discharge: 2021-07-08 | Disposition: A | Discharge status: Home / Self Care | Payer: 59 | Attending: Urgent Care | Admitting: Urgent Care

## 2021-07-08 ENCOUNTER — Other Ambulatory Visit: Payer: Self-pay

## 2021-07-08 ENCOUNTER — Other Ambulatory Visit (HOSPITAL_COMMUNITY): Payer: Self-pay

## 2021-07-08 ENCOUNTER — Ambulatory Visit (INDEPENDENT_AMBULATORY_CARE_PROVIDER_SITE_OTHER): Payer: 59

## 2021-07-08 DIAGNOSIS — S7002XA Contusion of left hip, initial encounter: Secondary | ICD-10-CM | POA: Diagnosis not present

## 2021-07-08 DIAGNOSIS — E2740 Unspecified adrenocortical insufficiency: Secondary | ICD-10-CM

## 2021-07-08 DIAGNOSIS — M797 Fibromyalgia: Secondary | ICD-10-CM

## 2021-07-08 DIAGNOSIS — G894 Chronic pain syndrome: Secondary | ICD-10-CM

## 2021-07-08 DIAGNOSIS — M1612 Unilateral primary osteoarthritis, left hip: Secondary | ICD-10-CM | POA: Diagnosis not present

## 2021-07-08 DIAGNOSIS — M1610 Unilateral primary osteoarthritis, unspecified hip: Secondary | ICD-10-CM

## 2021-07-08 DIAGNOSIS — M25552 Pain in left hip: Secondary | ICD-10-CM

## 2021-07-08 DIAGNOSIS — Z8739 Personal history of other diseases of the musculoskeletal system and connective tissue: Secondary | ICD-10-CM

## 2021-07-08 MED ORDER — KETOROLAC TROMETHAMINE 60 MG/2ML IM SOLN
60.0000 mg | Freq: Once | INTRAMUSCULAR | Status: AC
  Administered 2021-07-08: 60 mg via INTRAMUSCULAR

## 2021-07-08 MED ORDER — PREDNISONE 50 MG PO TABS
50.0000 mg | ORAL_TABLET | Freq: Every day | ORAL | 0 refills | Status: DC
  Filled 2021-07-08: qty 5, 5d supply, fill #0
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
## 2021-07-14 ENCOUNTER — Ambulatory Visit (INDEPENDENT_AMBULATORY_CARE_PROVIDER_SITE_OTHER): Payer: 59 | Admitting: Family Medicine

## 2021-07-14 ENCOUNTER — Other Ambulatory Visit (HOSPITAL_COMMUNITY): Payer: Self-pay

## 2021-07-14 ENCOUNTER — Encounter: Payer: Self-pay | Admitting: Family Medicine

## 2021-07-14 ENCOUNTER — Other Ambulatory Visit: Payer: Self-pay

## 2021-07-14 VITALS — BP 132/88 | HR 98 | Temp 96.8°F | Ht 70.0 in | Wt 233.0 lb

## 2021-07-14 DIAGNOSIS — E785 Hyperlipidemia, unspecified: Secondary | ICD-10-CM | POA: Diagnosis not present

## 2021-07-14 DIAGNOSIS — G894 Chronic pain syndrome: Secondary | ICD-10-CM | POA: Diagnosis not present

## 2021-07-14 DIAGNOSIS — M87052 Idiopathic aseptic necrosis of left femur: Secondary | ICD-10-CM

## 2021-07-14 DIAGNOSIS — R739 Hyperglycemia, unspecified: Secondary | ICD-10-CM

## 2021-07-14 NOTE — Progress Notes (Signed)
   Subjective:    Patient ID: Kathleen [name], female    DOB: 1961-04-12, 60 y.o.   MRN: [id number]  HPI This patient was seen today for chronic pain  The medication list was reviewed and updated.  Location of Pain for which the patient has been treated with regarding narcotics: left hip  Onset of this pain: chronic   -Compliance with medication: qd  - Number patient states they take daily:   -when was the last dose patient took? This morning  The patient was advised the importance of maintaining medication and not using illegal substances with these.  Here for refills and follow up  The patient was educated that we can provide 3 monthly scripts for their medication, it is their responsibility to follow the instructions.  Side effects or complications from medications: takes linzess for constipation  Patient is aware that pain medications are meant to minimize the severity of the pain to allow their pain levels to improve to allow for better function. They are aware of that pain medications cannot totally remove their pain.  Due for UDT ( at least once per year) :   Scale of 1 to 10 ( 1 is least 10 is most) Your pain level without the medicine: 10 Your pain level with medication 8  Scale 1 to 10 ( 1-helps very little, 10 helps very well) How well does your pain medication reduce your pain so you can function better through out the day? 8  Quality of the pain: ranges  Persistence of the pain: always  Modifying factors: none        Review of Systems     Objective:   Physical Exam  General-in no acute distress Eyes-no discharge Lungs-respiratory rate normal, CTA CV-no murmurs,RRR Extremities skin warm dry no edema Neuro grossly normal Behavior normal, alert       Assessment & Plan:  1. Avascular necrosis of bone of hip, left (HCC) Causes her severe pain she uses oxycodone for the pain does not abuse medicine takes approximately 6/day - Hemoglobin A1c -  Lipid panel - Basic metabolic panel  2. Chronic pain syndrome The patient was seen in followup for chronic pain. A review over at their current pain status was discussed. Drug registry was checked. Prescriptions were given.  Regular follow-up recommended. Discussion was held regarding the importance of compliance with medication as well as pain medication contract.  Patient was informed that medication may cause drowsiness and should not be combined  with other medications/alcohol or street drugs. If the patient feels medication is causing altered alertness then do not drive or operate dangerous equipment.  Should be noted that the patient appears to be meeting appropriate use of opioids and response.  Evidenced by improved function and decent pain control without significant side effects and no evidence of overt aberrancy issues.  Upon discussion with the patient today they understand that opioid therapy is optional and they feel that the pain has been refractory to reasonable conservative measures and is significant and affecting quality of life enough to warrant ongoing therapy and wishes to continue opioids.  Refills were provided. Drug registry checked prescription were sent in - Hemoglobin A1c - Lipid panel - Basic metabolic panel  3. Hyperlipidemia, unspecified hyperlipidemia type Check lab work continue healthy diet regular activity - Hemoglobin A1c - Lipid panel - Basic metabolic panel  4. Hyperglycemia Screen for diabetes due to hyperglycemia - Hemoglobin A1c - Lipid panel - Basic metabolic panel

## 2021-07-14 NOTE — Patient Instructions (Signed)
Healthy Eating Following a healthy eating pattern may help you to achieve and maintain a healthy body weight, reduce the risk of chronic disease, and live a long and productive life. It is important to follow a healthy eating pattern at an appropriate calorie level for your body. Your nutritional needs should be met primarily through food by choosing a variety of nutrient-rich foods. What are tips for following this plan? Reading food labels Read labels and choose the following: Reduced or low sodium. Juices with 100% fruit juice. Foods with low saturated fats and high polyunsaturated and monounsaturated fats. Foods with whole grains, such as whole wheat, cracked wheat, brown rice, and wild rice. Whole grains that are fortified with folic acid. This is recommended for women who are pregnant or who want to become pregnant. Read labels and avoid the following: Foods with a lot of added sugars. These include foods that contain brown sugar, corn sweetener, corn syrup, dextrose, fructose, glucose, high-fructose corn syrup, honey, invert sugar, lactose, malt syrup, maltose, molasses, raw sugar, sucrose, trehalose, or turbinado sugar. Do not eat more than the following amounts of added sugar per day: 6 teaspoons (25 g) for women. 9 teaspoons (38 g) for men. Foods that contain processed or refined starches and grains. Refined grain products, such as white flour, degermed cornmeal, white bread, and white rice. Shopping Choose nutrient-rich snacks, such as vegetables, whole fruits, and nuts. Avoid high-calorie and high-sugar snacks, such as potato chips, fruit snacks, and candy. Use oil-based dressings and spreads on foods instead of solid fats such as butter, stick margarine, or cream cheese. Limit pre-made sauces, mixes, and "instant" products such as flavored rice, instant noodles, and ready-made pasta. Try more plant-protein sources, such as tofu, tempeh, black beans, edamame, lentils, nuts, and  seeds. Explore eating plans such as the Mediterranean diet or vegetarian diet. Cooking Use oil to saut or stir-fry foods instead of solid fats such as butter, stick margarine, or lard. Try baking, boiling, grilling, or broiling instead of frying. Remove the fatty part of meats before cooking. Steam vegetables in water or broth. Meal planning  At meals, imagine dividing your plate into fourths: One-half of your plate is fruits and vegetables. One-fourth of your plate is whole grains. One-fourth of your plate is protein, especially lean meats, poultry, eggs, tofu, beans, or nuts. Include low-fat dairy as part of your daily diet. Lifestyle Choose healthy options in all settings, including home, work, school, restaurants, or stores. Prepare your food safely: Wash your hands after handling raw meats. Keep food preparation surfaces clean by regularly washing with hot, soapy water. Keep raw meats separate from ready-to-eat foods, such as fruits and vegetables. Cook seafood, meat, poultry, and eggs to the recommended internal temperature. Store foods at safe temperatures. In general: Keep cold foods at 40F (4.4C) or below. Keep hot foods at 140F (60C) or above. Keep your freezer at 0F (-17.8C) or below. Foods are no longer safe to eat when they have been between the temperatures of 40-140F (4.4-60C) for more than 2 hours. What foods should I eat? Fruits Aim to eat 2 cup-equivalents of fresh, canned (in natural juice), or frozen fruits each day. Examples of 1 cup-equivalent of fruit include 1 small apple, 8 large strawberries, 1 cup canned fruit,  cup dried fruit, or 1 cup 100% juice. Vegetables Aim to eat 2-3 cup-equivalents of fresh and frozen vegetables each day, including different varieties and colors. Examples of 1 cup-equivalent of vegetables include 2 medium carrots, 2 cups raw,   leafy greens, 1 cup chopped vegetable (raw or cooked), or 1 medium baked potato. Grains Aim to  eat 6 ounce-equivalents of whole grains each day. Examples of 1 ounce-equivalent of grains include 1 slice of bread, 1 cup ready-to-eat cereal, 3 cups popcorn, or  cup cooked rice, pasta, or cereal. Meats and other proteins Aim to eat 5-6 ounce-equivalents of protein each day. Examples of 1 ounce-equivalent of protein include 1 egg, 1/2 cup nuts or seeds, or 1 tablespoon (16 g) peanut butter. A cut of meat or fish that is the size of a deck of cards is about 3-4 ounce-equivalents. Of the protein you eat each week, try to have at least 8 ounces come from seafood. This includes salmon, trout, herring, and anchovies. Dairy Aim to eat 3 cup-equivalents of fat-free or low-fat dairy each day. Examples of 1 cup-equivalent of dairy include 1 cup (240 mL) milk, 8 ounces (250 g) yogurt, 1 ounces (44 g) natural cheese, or 1 cup (240 mL) fortified soy milk. Fats and oils Aim for about 5 teaspoons (21 g) per day. Choose monounsaturated fats, such as canola and olive oils, avocados, peanut butter, and most nuts, or polyunsaturated fats, such as sunflower, corn, and soybean oils, walnuts, pine nuts, sesame seeds, sunflower seeds, and flaxseed. Beverages Aim for six 8-oz glasses of water per day. Limit coffee to three to five 8-oz cups per day. Limit caffeinated beverages that have added calories, such as soda and energy drinks. Limit alcohol intake to no more than 1 drink a day for nonpregnant women and 2 drinks a day for men. One drink equals 12 oz of beer (355 mL), 5 oz of wine (148 mL), or 1 oz of hard liquor (44 mL). Seasoning and other foods Avoid adding excess amounts of salt to your foods. Try flavoring foods with herbs and spices instead of salt. Avoid adding sugar to foods. Try using oil-based dressings, sauces, and spreads instead of solid fats. This information is based on general [place] nutrition guidelines. For more information, visit [employer]. Exact amounts may vary based on your nutrition  needs. Summary A healthy eating plan may help you to maintain a healthy weight, reduce the risk of chronic diseases, and stay active throughout your life. Plan your meals. Make sure you eat the right portions of a variety of nutrient-rich foods. Try baking, boiling, grilling, or broiling instead of frying. Choose healthy options in all settings, including home, work, school, restaurants, or stores. This information is not intended to replace advice given to you by your health care provider. Make sure you discuss any questions you have with your health care provider. Document Revised: 04/11/2021 Document Reviewed: 04/11/2021 Elsevier Patient Education  [place].

## 2021-07-19 ENCOUNTER — Other Ambulatory Visit (HOSPITAL_COMMUNITY): Payer: Self-pay

## 2021-07-19 DIAGNOSIS — J849 Interstitial pulmonary disease, unspecified: Secondary | ICD-10-CM | POA: Diagnosis not present

## 2021-07-19 DIAGNOSIS — D860 Sarcoidosis of lung: Secondary | ICD-10-CM | POA: Diagnosis not present

## 2021-07-19 DIAGNOSIS — M6281 Muscle weakness (generalized): Secondary | ICD-10-CM | POA: Diagnosis not present

## 2021-07-19 DIAGNOSIS — J9601 Acute respiratory failure with hypoxia: Secondary | ICD-10-CM | POA: Diagnosis not present

## 2021-07-19 MED ORDER — OXYCODONE HCL 10 MG PO TABS
ORAL_TABLET | ORAL | 0 refills | Status: DC
  Filled 2021-07-19: qty 180, fill #0
  Filled 2021-07-21: qty 180, 30d supply, fill #0

## 2021-07-19 MED ORDER — OXYCODONE HCL 10 MG PO TABS
ORAL_TABLET | ORAL | 0 refills | Status: DC
  Filled 2021-07-19: qty 180, fill #0
  Filled 2021-08-17: qty 180, 30d supply, fill #0

## 2021-07-19 MED ORDER — OXYCODONE HCL 10 MG PO TABS
ORAL_TABLET | ORAL | 0 refills | Status: DC
  Filled 2021-07-19: qty 180, fill #0
  Filled 2021-09-20: qty 180, 30d supply, fill #0

## 2021-07-21 ENCOUNTER — Other Ambulatory Visit (HOSPITAL_COMMUNITY): Payer: Self-pay

## 2021-07-22 DIAGNOSIS — J9601 Acute respiratory failure with hypoxia: Secondary | ICD-10-CM | POA: Diagnosis not present

## 2021-07-22 DIAGNOSIS — J849 Interstitial pulmonary disease, unspecified: Secondary | ICD-10-CM | POA: Diagnosis not present

## 2021-07-22 DIAGNOSIS — D860 Sarcoidosis of lung: Secondary | ICD-10-CM | POA: Diagnosis not present

## 2021-07-22 DIAGNOSIS — M6281 Muscle weakness (generalized): Secondary | ICD-10-CM | POA: Diagnosis not present

## 2021-07-27 ENCOUNTER — Other Ambulatory Visit (HOSPITAL_COMMUNITY): Payer: Self-pay

## 2021-07-27 MED FILL — Hydrocortisone Tab 10 MG: ORAL | 30 days supply | Qty: 90 | Fill #1 | Status: AC

## 2021-08-04 ENCOUNTER — Other Ambulatory Visit (HOSPITAL_COMMUNITY): Payer: Self-pay

## 2021-08-04 ENCOUNTER — Other Ambulatory Visit: Payer: Self-pay | Admitting: Family Medicine

## 2021-08-04 DIAGNOSIS — E785 Hyperlipidemia, unspecified: Secondary | ICD-10-CM | POA: Diagnosis not present

## 2021-08-04 DIAGNOSIS — M87052 Idiopathic aseptic necrosis of left femur: Secondary | ICD-10-CM | POA: Diagnosis not present

## 2021-08-04 DIAGNOSIS — G894 Chronic pain syndrome: Secondary | ICD-10-CM | POA: Diagnosis not present

## 2021-08-04 DIAGNOSIS — R739 Hyperglycemia, unspecified: Secondary | ICD-10-CM | POA: Diagnosis not present

## 2021-08-05 LAB — BASIC METABOLIC PANEL
BUN/Creatinine Ratio: 11 (ref 9–23)
BUN: 11 mg/dL (ref 6–24)
CO2: 24 mmol/L (ref 20–29)
Calcium: 9.8 mg/dL (ref 8.7–10.2)
Chloride: 98 mmol/L (ref 96–106)
Creatinine, Ser: 0.99 mg/dL (ref 0.57–1.00)
Glucose: 108 mg/dL — ABNORMAL HIGH (ref 70–99)
Potassium: 5.2 mmol/L (ref 3.5–5.2)
Sodium: 137 mmol/L (ref 134–144)
eGFR: 66 mL/min/{1.73_m2} (ref >59)

## 2021-08-05 LAB — LIPID PANEL
Chol/HDL Ratio: 4.1 ratio (ref 0.0–4.4)
Cholesterol, Total: 253 mg/dL — ABNORMAL HIGH (ref 100–199)
HDL: 61 mg/dL (ref >39)
LDL Chol Calc (NIH): 146 mg/dL — ABNORMAL HIGH (ref 0–99)
Triglycerides: 255 mg/dL — ABNORMAL HIGH (ref 0–149)
VLDL Cholesterol Cal: 46 mg/dL — ABNORMAL HIGH (ref 5–40)

## 2021-08-05 LAB — HEMOGLOBIN A1C
Est. average glucose Bld gHb Est-mCnc: 108 mg/dL
Hgb A1c MFr Bld: 5.4 % (ref 4.8–5.6)

## 2021-08-06 MED ORDER — ONDANSETRON 8 MG PO TBDP
ORAL_TABLET | Freq: Three times a day (TID) | ORAL | 5 refills | Status: DC | PRN
  Filled 2021-08-06: qty 15, 5d supply, fill #0
  Filled 2021-09-12: qty 15, 5d supply, fill #1

## 2021-08-07 ENCOUNTER — Other Ambulatory Visit (HOSPITAL_COMMUNITY): Payer: Self-pay

## 2021-08-10 ENCOUNTER — Encounter (HOSPITAL_COMMUNITY): Payer: Self-pay | Admitting: Physical Therapy

## 2021-08-10 NOTE — Therapy (Signed)
[facility] [place], [place], [address] Phone: [phone]   Fax:  [phone]  Patient Details  Name: Kathleen [name] MRN: [id number] Date of Birth: [birth year] Referring Provider:  No ref. 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 Patient goals were not met. Patient is being discharged due to a change in medical status.   Rayetta Humphrey, PT CLT [id number]  08/10/2021, 1:09 PM  [facility] [address] [place], [place], [address] Phone: [phone]   Fax:  [phone]

## 2021-08-14 ENCOUNTER — Other Ambulatory Visit (HOSPITAL_COMMUNITY): Payer: Self-pay

## 2021-08-17 ENCOUNTER — Other Ambulatory Visit (HOSPITAL_COMMUNITY): Payer: Self-pay

## 2021-08-17 ENCOUNTER — Other Ambulatory Visit: Payer: Self-pay | Admitting: "Endocrinology

## 2021-08-17 MED ORDER — HYDROCORTISONE 10 MG PO TABS
ORAL_TABLET | ORAL | 1 refills | Status: DC
  Filled 2021-08-17: qty 21, 7d supply, fill #0
  Filled 2021-08-29: qty 90, 30d supply, fill #1
  Filled 2021-09-25: qty 60, 20d supply, fill #2

## 2021-08-18 DIAGNOSIS — J9601 Acute respiratory failure with hypoxia: Secondary | ICD-10-CM | POA: Diagnosis not present

## 2021-08-18 DIAGNOSIS — M6281 Muscle weakness (generalized): Secondary | ICD-10-CM | POA: Diagnosis not present

## 2021-08-18 DIAGNOSIS — J849 Interstitial pulmonary disease, unspecified: Secondary | ICD-10-CM | POA: Diagnosis not present

## 2021-08-18 DIAGNOSIS — D860 Sarcoidosis of lung: Secondary | ICD-10-CM | POA: Diagnosis not present

## 2021-08-21 DIAGNOSIS — M6281 Muscle weakness (generalized): Secondary | ICD-10-CM | POA: Diagnosis not present

## 2021-08-21 DIAGNOSIS — D860 Sarcoidosis of lung: Secondary | ICD-10-CM | POA: Diagnosis not present

## 2021-08-21 DIAGNOSIS — J849 Interstitial pulmonary disease, unspecified: Secondary | ICD-10-CM | POA: Diagnosis not present

## 2021-08-21 DIAGNOSIS — J9601 Acute respiratory failure with hypoxia: Secondary | ICD-10-CM | POA: Diagnosis not present

## 2021-08-23 ENCOUNTER — Other Ambulatory Visit (HOSPITAL_COMMUNITY): Payer: Self-pay

## 2021-08-25 ENCOUNTER — Other Ambulatory Visit (HOSPITAL_COMMUNITY): Payer: Self-pay

## 2021-08-29 ENCOUNTER — Other Ambulatory Visit (HOSPITAL_COMMUNITY): Payer: Self-pay

## 2021-09-08 ENCOUNTER — Other Ambulatory Visit (HOSPITAL_COMMUNITY): Payer: Self-pay

## 2021-09-11 ENCOUNTER — Other Ambulatory Visit (HOSPITAL_COMMUNITY): Payer: Self-pay

## 2021-09-12 ENCOUNTER — Other Ambulatory Visit (HOSPITAL_COMMUNITY): Payer: Self-pay

## 2021-09-14 DIAGNOSIS — E039 Hypothyroidism, unspecified: Secondary | ICD-10-CM | POA: Diagnosis not present

## 2021-09-14 DIAGNOSIS — E2740 Unspecified adrenocortical insufficiency: Secondary | ICD-10-CM | POA: Diagnosis not present

## 2021-09-15 LAB — T4, FREE: Free T4: 1.71 ng/dL (ref 0.82–1.77)

## 2021-09-15 LAB — TSH: TSH: 0.088 u[IU]/mL — ABNORMAL LOW (ref 0.450–4.500)

## 2021-09-15 LAB — COMPREHENSIVE METABOLIC PANEL
ALT: 12 IU/L (ref 0–32)
AST: 11 IU/L (ref 0–40)
Albumin/Globulin Ratio: 1.8 (ref 1.2–2.2)
Albumin: 4.5 g/dL (ref 3.8–4.9)
Alkaline Phosphatase: 99 IU/L (ref 44–121)
BUN/Creatinine Ratio: 17 (ref 9–23)
BUN: 17 mg/dL (ref 6–24)
Bilirubin Total: 0.3 mg/dL (ref 0.0–1.2)
CO2: 22 mmol/L (ref 20–29)
Calcium: 9.9 mg/dL (ref 8.7–10.2)
Chloride: 95 mmol/L — ABNORMAL LOW (ref 96–106)
Creatinine, Ser: 1.02 mg/dL — ABNORMAL HIGH (ref 0.57–1.00)
Globulin, Total: 2.5 g/dL (ref 1.5–4.5)
Glucose: 123 mg/dL — ABNORMAL HIGH (ref 70–99)
Potassium: 5.4 mmol/L — ABNORMAL HIGH (ref 3.5–5.2)
Sodium: 134 mmol/L (ref 134–144)
Total Protein: 7 g/dL (ref 6.0–8.5)
eGFR: 64 mL/min/{1.73_m2} (ref >59)

## 2021-09-18 ENCOUNTER — Other Ambulatory Visit: Payer: Self-pay

## 2021-09-18 ENCOUNTER — Encounter: Payer: Self-pay | Admitting: "Endocrinology

## 2021-09-18 ENCOUNTER — Ambulatory Visit (INDEPENDENT_AMBULATORY_CARE_PROVIDER_SITE_OTHER): Payer: 59 | Admitting: "Endocrinology

## 2021-09-18 ENCOUNTER — Ambulatory Visit
Admission: EM | Admit: 2021-09-18 | Discharge: 2021-09-18 | Disposition: A | Discharge status: Home / Self Care | Payer: 59 | Attending: Family Medicine | Admitting: Family Medicine

## 2021-09-18 ENCOUNTER — Other Ambulatory Visit (HOSPITAL_COMMUNITY): Payer: Self-pay

## 2021-09-18 VITALS — BP 100/72 | HR 96 | Ht 70.0 in | Wt 231.2 lb

## 2021-09-18 DIAGNOSIS — R52 Pain, unspecified: Secondary | ICD-10-CM | POA: Diagnosis not present

## 2021-09-18 DIAGNOSIS — E2740 Unspecified adrenocortical insufficiency: Secondary | ICD-10-CM

## 2021-09-18 DIAGNOSIS — Z8709 Personal history of other diseases of the respiratory system: Secondary | ICD-10-CM

## 2021-09-18 DIAGNOSIS — J22 Unspecified acute lower respiratory infection: Secondary | ICD-10-CM | POA: Diagnosis not present

## 2021-09-18 DIAGNOSIS — J849 Interstitial pulmonary disease, unspecified: Secondary | ICD-10-CM | POA: Diagnosis not present

## 2021-09-18 DIAGNOSIS — D860 Sarcoidosis of lung: Secondary | ICD-10-CM | POA: Diagnosis not present

## 2021-09-18 DIAGNOSIS — R0602 Shortness of breath: Secondary | ICD-10-CM

## 2021-09-18 DIAGNOSIS — Z20828 Contact with and (suspected) exposure to other viral communicable diseases: Secondary | ICD-10-CM | POA: Diagnosis not present

## 2021-09-18 DIAGNOSIS — E039 Hypothyroidism, unspecified: Secondary | ICD-10-CM | POA: Diagnosis not present

## 2021-09-18 DIAGNOSIS — M6281 Muscle weakness (generalized): Secondary | ICD-10-CM | POA: Diagnosis not present

## 2021-09-18 DIAGNOSIS — J9601 Acute respiratory failure with hypoxia: Secondary | ICD-10-CM | POA: Diagnosis not present

## 2021-09-18 MED ORDER — DEXAMETHASONE SODIUM PHOSPHATE 10 MG/ML IJ SOLN
10.0000 mg | Freq: Once | INTRAMUSCULAR | Status: AC
  Administered 2021-09-18: 10 mg via INTRAMUSCULAR

## 2021-09-18 MED ORDER — LEVOTHYROXINE SODIUM 50 MCG PO TABS
50.0000 ug | ORAL_TABLET | Freq: Every day | ORAL | 1 refills | Status: DC
  Filled 2021-09-18: qty 90, 90d supply, fill #0
  Filled 2021-11-21: qty 90, 90d supply, fill #1

## 2021-09-18 MED ORDER — DOXYCYCLINE HYCLATE 100 MG PO CAPS: 100.0000 mg | ORAL_CAPSULE | Freq: Two times a day (BID) | ORAL | 0 refills | Status: DC

## 2021-09-18 MED ORDER — MOLNUPIRAVIR EUA 200MG CAPSULE: 4.0000 | ORAL_CAPSULE | Freq: Two times a day (BID) | ORAL | 0 refills | Status: AC

## 2021-09-18 NOTE — ED Triage Notes (Signed)
Patient states that 3 days ago she started with cough, congestion, headache and body aches  Patient states she tried Theraflu and Mucinex without any relief  Denies Fever

## 2021-09-18 NOTE — Progress Notes (Signed)
09/18/2021, 5:16 PM     Endocrinology follow-up note  Subjective:    Patient ID: Kathleen [name], female    DOB: October 01, 1960, PCP [name], MD   Past Medical History:  Diagnosis Date   Adrenal insufficiency ([place])    Anemia    Anxiety    Avascular bone necrosis ([place])    Breast fibrocystic disorder    Chronic pain    COPD (chronic obstructive pulmonary disease) ([place])    Encounter for long-term opiate analgesic use 02/02/2020   Patient has chronic pain with femoral head avascular necrosis.  Is under pain contract   Fibromyalgia    Gastroesophageal reflux disease    Hypothyroidism    Mitral valve prolapse    Orthostatic hypotension    Peripheral neuropathy    PONV (postoperative nausea and vomiting)    Restrictive lung disease    Moderate to severe   Sarcoidosis    Biopsy proven - [facility]   SOB (shortness of breath)    chronic   Past Surgical History:  Procedure Laterality Date   BIOPSY  10/17/2018   Procedure: BIOPSY;  Surgeon: Rogene Houston, MD;  Location: [facility];  Service: Endoscopy;;  duodenum, antrum, gastric body   BIOPSY  03/03/2021   Procedure: BIOPSY;  Surgeon: Rogene Houston, MD;  Location: [facility];  Service: Endoscopy;;   BREAST LUMPECTOMY Bilateral 01/12/2011   right   CHOLECYSTECTOMY N/A 04/17/2021   Procedure: LAPAROSCOPIC CHOLECYSTECTOMY;  Surgeon: Aviva Signs, MD;  Location: [facility];  Service: General;  Laterality: N/A;   ESOPHAGEAL DILATION N/A 03/03/2021   Procedure: ESOPHAGEAL DILATION;  Surgeon: Rogene Houston, MD;  [facility]: [facility];  Service: Endoscopy;  Laterality: N/A;   ESOPHAGOGASTRODUODENOSCOPY (EGD) WITH PROPOFOL N/A 10/17/2018   Procedure: ESOPHAGOGASTRODUODENOSCOPY (EGD) WITH PROPOFOL;  Surgeon: Rogene Houston, MD;  [facility];  Service: Endoscopy;  Laterality: N/A;  2:25   ESOPHAGOGASTRODUODENOSCOPY (EGD) WITH PROPOFOL N/A 03/03/2021   Procedure:  ESOPHAGOGASTRODUODENOSCOPY (EGD) WITH PROPOFOL;  Surgeon: Rogene Houston, MD;  [facility]: [facility];  Service: Endoscopy;  Laterality: N/A;  12:15   TUBAL LIGATION     Social History   Socioeconomic History   Marital status: Married    Spouse name: Not on file   Number of children: 1   Years of education: Not on file   Highest education level: Not on file  Occupational History   Occupation: Equities trader Payable    Employer: [employer]  Tobacco Use   Smoking status: Never   Smokeless tobacco: Never  Vaping Use   Vaping Use: Never used  Substance and Sexual Activity   Alcohol use: No   Drug use: No   Sexual activity: Yes    Birth control/protection: Post-menopausal  Other Topics Concern   Not on file  Social History Narrative   Daily caffeine    Living with husband   Right handed   She has been on disability since 2014.    Social Determinants of Health   Financial Resource Strain: Not on file  Food Insecurity: Not on file  Transportation Needs: Not on file  Physical Activity: Not on file  Stress: Not on file  Social  Connections: Not on file   Family History  Problem Relation Age of Onset   Cardiomyopathy Mother    Breast cancer Mother    Prostate cancer Father    Neurofibromatosis Cousin    Heart disease Maternal Aunt    Bipolar disorder Daughter    Colon cancer Neg Hx    Outpatient Encounter Medications as of 09/18/2021  Medication Sig   acetaminophen (TYLENOL) 500 MG tablet Take 1,000 mg by mouth every 8 (eight) hours as needed for headache.   albuterol (VENTOLIN HFA) 108 (90 Base) MCG/ACT inhaler Inhale 2 puffs into the lungs every 6 (six) hours as needed for wheezing or shortness of breath.   ascorbic acid (VITAMIN C) 500 MG tablet Take 1 tablet (500 mg total) by mouth daily.   budesonide (PULMICORT) 0.5 MG/2ML nebulizer solution Inhale 2 mL (0.5 mg total) by nebulization two (2) times a day as needed.   cholestyramine (QUESTRAN) 4 g packet Mix 1 packet as  directed and take by mouth twice daily.   docusate sodium (COLACE) 100 MG capsule Take 100 mg by mouth daily.   EPINEPHrine 0.3 mg/0.3 mL IJ SOAJ injection Inject 0.3 mLs (0.3 mg total) into the muscle as needed for anaphylaxis.   ferrous sulfate 325 (65 FE) MG EC tablet Take 325 mg by mouth daily with breakfast.   FLUoxetine (PROZAC) 20 MG capsule Take 3 capsules by mouth once a day.   formoterol (PERFOROMIST) 20 MCG/2ML nebulizer solution Inhale 1 vial via nebulizer two times a day.   hydrocortisone (CORTEF) 10 MG tablet Take 2 tablets by mouth at 8:00 am and 1 tablet at lunch   hydrOXYzine (ATARAX) 25 MG tablet TAKE 1 TABLET BY MOUTH 3 TIMES DAILY AS NEEDED FOR ANXIETY   levothyroxine (SYNTHROID) 50 MCG tablet Take 1 tablet (50 mcg total) by mouth daily before breakfast.   linaclotide (LINZESS) 290 MCG CAPS capsule Take 1 capsule (290 mcg total) by mouth daily before breakfast.   midodrine (PROAMATINE) 5 MG tablet TAKE 1 TABLET BY MOUTH TWICE DAILY WITH A MEAL   ondansetron (ZOFRAN-ODT) 8 MG disintegrating tablet DISSOLVE 1 TABLET BY MOUTH EVERY 8 HOURS AS NEEDED FOR NAUSEA & VOMITING   Oxycodone HCl 10 MG TABS Take 1 tablet by mouth every 4 hours as needed for pain (max 6 tablets daily)   Oxycodone HCl 10 MG TABS Take 1 tablet by mouth every 4 hours as needed for pain (max 6 tablets daily).  Fill 08/17/21 or after.   [START ON 09/20/2021] Oxycodone HCl 10 MG TABS Take 1 tablet by mouth every 4 hours as needed for pain (max 6 tablets daily).  Fill 09/20/2021.   pantoprazole (PROTONIX) 40 MG tablet TAKE 1 TABLET BY MOUTH ONCE DAILY   predniSONE (DELTASONE) 5 MG tablet Take 1 tablet (5 mg total) by mouth in the morning.   solifenacin (VESICARE) 10 MG tablet Take 1 tablet (10 mg total) by mouth daily. (Patient not taking: Reported on 09/18/2021)   [DISCONTINUED] levothyroxine (SYNTHROID) 75 MCG tablet Take 1 tablet (75 mcg total) by mouth daily before breakfast.   [DISCONTINUED] zinc sulfate 220 (50  Zn) MG capsule Take 1 capsule (220 mg total) by mouth daily.   No facility-administered encounter medications on file as of 09/18/2021.   ALLERGIES: Allergies  Allergen Reactions   Bee Venom Anaphylaxis   Contrast Media [Iodinated Contrast Media]     CT chest w/ contrast at OSH followed by SOB resolved w/ single dose steroids, never ENT swelling,  intubation or pressors, has had multiple contrasted scans since   Bactrim [Sulfamethoxazole-Trimethoprim] Nausea And Vomiting   Codeine Itching   Morphine And Related Other (See Comments)    Unknown reaction- pt gets sick, dizzy, and confused Intolerance, not an allergy, received this in the emergency department recently without a problem.    VACCINATION STATUS: Immunization History  Administered Date(s) Administered   Influenza Whole 06/27/2010, 07/28/2011   Moderna Sars-Covid-2 Vaccination 10/27/2019, 11/27/2019   Pneumococcal Conjugate-13 06/15/2014   Pneumococcal Polysaccharide-23 06/10/2013    HPI Mysti R Johnsen is 61 y.o. female who is being seen in follow-up in the management of adrenal insufficiency, hypothyroidism.  -She is on a stable dose of steroids including hydrocortisone 30 mg a day, prednisone 10 mg p.o. daily as well as levothyroxine 75 mcg p.o. daily.    She is returning for follow-up.  She has no new complaints today.  Her previsit thyroid function tests are consistent with slight over replacement.    -Patient largely avoided ER visits in recent month. - Her history is complicated including sarcoidosis which she dealt with for more than the last 10 years.  She is on regular follow-up with her pulmonologist.  Over the years, her therapy included high-dose steroids, up to 60 mg daily of prednisone, for the last 4-6 years.  She is currently on maintenance dose of prednisone 10 mg p.o. daily.  For adrenal  insufficiency, she was initiated and kept on hydrocortisone. She is currently on 20 mg p.o. every morning and 10 mg  p.o. daily every noon.     -She reports better consistency taking her medications this time.  She also has hypothyroidism on levothyroxine 75 mcg p.o. every morning.   -She has significant cognitive deficit.  - The details of her diagnosis with adrenal insufficiency  is not available to review. -She is not wearing her medic alert today.  . She denies any history of tuberculosis, incarceration, nor overseas travel . -She denies family history of adrenal, pituitary dysfunction. She has family history of hypothyroidism in one sibling. - Denies any history of intra-abdominal injury. She is on hydrocodone for chronic pain and inhaled bronchodilators. She is known to have orthostatic hypotension for which she is on midodrine.     Review of Systems Limited as above.  Objective:    BP 100/72    Pulse 96    Ht 5\' 10"  (1.778 m)    Wt 231 lb 3.2 oz (104.9 kg)    LMP 05/17/2016    BMI 33.17 kg/m   Wt Readings from Last 3 Encounters:  09/18/21 231 lb 3.2 oz (104.9 kg)  07/14/21 233 lb (105.7 kg)  06/27/21 236 lb 8 oz (107.3 kg)    Physical Exam    CMP ( most recent) CMP     Component Value Date/Time   NA 134 09/14/2021 0804   K 5.4 (H) 09/14/2021 0804   CL 95 (L) 09/14/2021 0804   CO2 22 09/14/2021 0804   GLUCOSE 123 (H) 09/14/2021 0804   GLUCOSE 161 (H) 05/22/2021 0445   BUN 17 09/14/2021 0804   CREATININE 1.02 (H) 09/14/2021 0804   CREATININE 0.85 08/26/2014 0831   CALCIUM 9.9 09/14/2021 0804   PROT 7.0 09/14/2021 0804   ALBUMIN 4.5 09/14/2021 0804   AST 11 09/14/2021 0804   ALT 12 09/14/2021 0804   ALKPHOS 99 09/14/2021 0804   BILITOT 0.3 09/14/2021 0804   GFRNONAA >60 05/22/2021 0445   GFRAA 92 05/24/2020 1038  Diabetic Labs (most recent): Lab Results  Component Value Date   HGBA1C 5.4 08/04/2021   HGBA1C 5.4 02/03/2019   HGBA1C 5.0 12/31/2016     Lipid Panel ( most recent) Lipid Panel     Component Value Date/Time   CHOL 253 (H) 08/04/2021 0826   TRIG 255  (H) 08/04/2021 0826   HDL 61 08/04/2021 0826   CHOLHDL 4.1 08/04/2021 0826   CHOLHDL 4.0 08/26/2014 0831   VLDL 31 08/26/2014 0831   LDLCALC 146 (H) 08/04/2021 0826      Lab Results  Component Value Date   TSH 0.088 (L) 09/14/2021   TSH 0.668 05/08/2021   TSH 0.307 (L) 11/09/2020   TSH 0.548 05/24/2020   TSH 0.689 11/20/2019   TSH 0.364 (L) 06/29/2019   TSH 0.746 02/03/2019   TSH 1.290 08/07/2018   TSH 2.280 04/17/2018   TSH 0.527 12/04/2017   FREET4 1.71 09/14/2021   FREET4 1.06 05/08/2021   FREET4 1.32 11/09/2020   FREET4 1.46 05/24/2020   FREET4 1.25 11/20/2019   FREET4 1.43 06/29/2019   FREET4 1.32 02/03/2019   FREET4 1.38 08/09/2017   FREET4 1.15 04/19/2017   FREET4 0.99 12/31/2016      Assessment & Plan:   1. Adrenal insufficiency ([place]) -I have reviewed her new set of labs, available records and clinically evaluated this patient. She does have multiple risk factors for adrenal insufficiency  including exposure to high-dose steroids related to her sarcoidosis, as well as opioid therapy related to diffuse pain. -  She has taken prednisone up to 60 mg daily for up to 6 years prior to her diagnosis with adrenal insufficiency. -Her SPO2 is 94%, 5 to 6% deficit could be a reason for her fatigue.  She will not need any higher dose of steroids than what she takes. -She is on a stable dose of hydrocortisone 20 mg a.m. and 10 mg daily at noon in addition to her prednisone 10 mg p.o. daily continued related to her pulmonary sarcoidosis.  For the last couple of days, she thinks she has the flu.  She is advised to double her p.m. hydrocortisone to 20 mg for the next 5 days.   I had a lengthy discussion with her about the need to avoid any further unnecessary escalation on steroid replacement.    She is advised to remain on same steroid replacement dose and schedule.   -She will not be considered for work-up by withdrawing steroids supports,  risk of adrenal crisis.   -She  does not have enough support to perform steroid injection at home. -She is advised to go to ER if she cannot keep her medications down due to intractable vomiting. - She will be evaluated for the need for mineralocorticoid replacement which may help to stabilize her blood pressure. She is currently on medodrine 5 mg by mouth twice a day, maintaining her blood pressure within normal range. - I had a long discussion with her regarding steroids sick day rules, risk of unintended weight gain/diabetes and the need for periodic screening with A1c.  -Her last A1c was 5.4% in June 2020.  She will have point-of-care A1c during her next visit.      2. Hypothyroidism:  -Her recent labs for thyroid function are consistent with over replacement.  I discussed and lowered her levothyroxine to 50 mcg p.o. daily before breakfast.    - We discussed about the correct intake of her thyroid hormone, on empty stomach at fasting, with water, separated by  at least 30 minutes from breakfast and other medications,  and separated by more than 4 hours from calcium, iron, multivitamins, acid reflux medications (PPIs). -Patient is made aware of the fact that thyroid hormone replacement is needed for life, dose to be adjusted by periodic monitoring of thyroid function tests.  - I advised her  to maintain close follow up with [name], MD for primary care needs.    I spent 22 minutes in the care of the patient today including review of labs from Thyroid Function, CMP, and other relevant labs ; imaging/biopsy records (current and previous including abstractions from other facilities); face-to-face time discussing  her lab results and symptoms, medications doses, her options of short and long term treatment based on the latest standards of care / guidelines;   and documenting the encounter.  [name]  participated in the discussions, expressed understanding, and voiced agreement with the above plans.  All questions  were answered to her satisfaction. she is encouraged to contact clinic should she have any questions or concerns prior to her return visit.    Follow up plan: Return in about 6 months (around 03/18/2022) for F/U with Pre-visit Labs.   Glade Lloyd, MD [facility] [address] [place], [place] [address] Phone: [phone]  Fax: [phone]     09/18/2021, 5:16 PM  This note was partially dictated with voice recognition software. Similar sounding words can be transcribed inadequately or may not  be corrected upon review.

## 2021-09-19 LAB — COVID-19, FLU A+B NAA
Influenza A, NAA: NOT DETECTED
Influenza B, NAA: NOT DETECTED
SARS-CoV-2, NAA: NOT DETECTED

## 2021-09-19 NOTE — ED Provider Notes (Signed)
[facility]    CSN: [id number] Arrival date & time: 09/18/21  1044      History   Chief Complaint Chief Complaint  Patient presents with   Cough    Congestion, cough, body aches and headache    HPI Kathleen [name] is a 61 y.o. female.   Presenting today with 3-day history of hacking productive cough that causes her to lose her breath, nasal congestion, headache, chills, body aches, severe fatigue, weakness.  Denies abdominal pain, nausea vomiting diarrhea, known fever, sore throat.  So far trying TheraFlu, Mucinex, inhaler and nebulizer regimen for her pulmonary fibrosis and sarcoidosis with minimal relief.  States that she has oxygen at home for as needed use and has been on 2 to 3 L here and there for shortness of breath.  Multiple sick contacts recently.   Past Medical History:  Diagnosis Date   Adrenal insufficiency (HCC)    Anemia    Anxiety    Avascular bone necrosis (HCC)    Breast fibrocystic disorder    Chronic pain    COPD (chronic obstructive pulmonary disease) ([place])    Encounter for long-term opiate analgesic use 02/02/2020   Patient has chronic pain with femoral head avascular necrosis.  Is under pain contract   Fibromyalgia    Gastroesophageal reflux disease    Hypothyroidism    Mitral valve prolapse    Orthostatic hypotension    Peripheral neuropathy    PONV (postoperative nausea and vomiting)    Restrictive lung disease    Moderate to severe   Sarcoidosis    Biopsy proven - [facility]   SOB (shortness of breath)    chronic    Patient Active Problem List   Diagnosis Date Noted   Chronic respiratory failure with hypoxia ([place]) 05/22/2021   Chronic adrenal insufficiency ([place]) 05/22/2021   Anxiety and depression 05/22/2021   Vitamin D deficiency 05/09/2021   Esophageal dysphagia 02/21/2021   Left foot drop 01/09/2021   Cutaneous abscess of axilla 01/09/2021   Pulmonary HTN ([place]) 10/12/2020   Scalp abscess 09/29/2020   Dysuria 09/29/2020    Encounter for long-term opiate analgesic use 02/02/2020   Acute adrenal insufficiency ([place]) 01/27/2020   Interstitial lung disease ([place]) 04/14/2019   Non-intractable vomiting 09/22/2018   Nausea vomiting and diarrhea    GERD (gastroesophageal reflux disease) 10/15/2017   Personal history of noncompliance with medical treatment, presenting hazards to health 04/26/2017   Hypothyroidism 01/15/2017   Symptomatic bradycardia    Adrenal insufficiency ([place]) 08/17/2016   Hypokalemia 08/17/2016   Chronic pain syndrome 04/20/2016   Vaginal bleeding 01/02/2016   Nausea and vomiting 12/29/2015   Hypotension 12/29/2015   Irritable bowel syndrome with constipation 11/14/2015   Avascular necrosis of bone of hip, left ([place]) 04/05/2015   Bimalleolar fracture of right ankle 02/11/2015   Post herpetic neuralgia 10/22/2014   Hyperlipidemia 08/25/2014   Obstructive sleep apnea 02/18/2014   Obesity, unspecified 05/06/2013   Fibromyalgia 02/11/2013   Urinary retention 02/02/2013   Orthostatic hypotension 64/33/2951   Helicobacter pylori gastritis 05/23/2012   Fibrocystic breast disease in female 04/09/2012   Anxiety state 10/19/2010   Sarcoidosis 06/21/2008    Past Surgical History:  Procedure Laterality Date   BIOPSY  10/17/2018   Procedure: BIOPSY;  Surgeon: Rogene Houston, MD;  Location: [facility];  Service: Endoscopy;;  duodenum, antrum, gastric body   BIOPSY  03/03/2021   Procedure: BIOPSY;  Surgeon: Rogene Houston, MD;  Location: [facility];  Service: Endoscopy;;   BREAST LUMPECTOMY Bilateral 01/12/2011   right   CHOLECYSTECTOMY N/A 04/17/2021   Procedure: LAPAROSCOPIC CHOLECYSTECTOMY;  Surgeon: Aviva Signs, MD;  Location: [facility];  Service: General;  Laterality: N/A;   ESOPHAGEAL DILATION N/A 03/03/2021   Procedure: ESOPHAGEAL DILATION;  Surgeon: Rogene Houston, MD;  Location: [facility];  Service: Endoscopy;  Laterality: N/A;   ESOPHAGOGASTRODUODENOSCOPY (EGD) WITH PROPOFOL  N/A 10/17/2018   Procedure: ESOPHAGOGASTRODUODENOSCOPY (EGD) WITH PROPOFOL;  Surgeon: Rogene Houston, MD;  [facility]: [facility];  Service: Endoscopy;  Laterality: N/A;  2:25   ESOPHAGOGASTRODUODENOSCOPY (EGD) WITH PROPOFOL N/A 03/03/2021   Procedure: ESOPHAGOGASTRODUODENOSCOPY (EGD) WITH PROPOFOL;  Surgeon: Rogene Houston, MD;  [facility]: [facility];  Service: Endoscopy;  Laterality: N/A;  12:15   TUBAL LIGATION      OB History     Gravida  1   Para  1   Term  1   Preterm      AB      Living         SAB      IAB      Ectopic      Multiple      Live Births               Home Medications    Prior to Admission medications   Medication Sig Start Date End Date Taking? Authorizing Provider  doxycycline (VIBRAMYCIN) 100 MG capsule Take 1 capsule (100 mg total) by mouth 2 (two) times daily. 09/18/21  Yes Volney American, PA-C  molnupiravir EUA (LAGEVRIO) 200 mg CAPS capsule Take 4 capsules (800 mg total) by mouth 2 (two) times daily for 5 days. 09/18/21 09/23/21 Yes Volney American, PA-C  acetaminophen (TYLENOL) 500 MG tablet Take 1,000 mg by mouth every 8 (eight) hours as needed for headache.    [provider]  albuterol (VENTOLIN HFA) 108 (90 Base) MCG/ACT inhaler Inhale 2 puffs into the lungs every 6 (six) hours as needed for wheezing or shortness of breath. 05/22/21   Roxan Hockey, MD  ascorbic acid (VITAMIN C) 500 MG tablet Take 1 tablet (500 mg total) by mouth daily. 05/23/21   Roxan Hockey, MD  budesonide (PULMICORT) 0.5 MG/2ML nebulizer solution Inhale 2 mL (0.5 mg total) by nebulization two (2) times a day as needed. 06/08/21     cholestyramine (QUESTRAN) 4 g packet Mix 1 packet as directed and take by mouth twice daily. 05/09/21   [name], [name], MD  docusate sodium (COLACE) 100 MG capsule Take 100 mg by mouth daily.    [provider]  EPINEPHrine 0.3 mg/0.3 mL IJ SOAJ injection Inject 0.3 mLs (0.3 mg total) into the  muscle as needed for anaphylaxis. 02/26/20   [name], MD  ferrous sulfate 325 (65 FE) MG EC tablet Take 325 mg by mouth daily with breakfast.    [provider]  FLUoxetine (PROZAC) 20 MG capsule Take 3 capsules by mouth once a day. 06/14/21   [name], MD  formoterol (PERFOROMIST) 20 MCG/2ML nebulizer solution Inhale 1 vial via nebulizer two times a day. 06/21/21     hydrocortisone (CORTEF) 10 MG tablet Take 2 tablets by mouth at 8:00 am and 1 tablet at lunch 08/17/21   [name], MD  hydrOXYzine (ATARAX) 25 MG tablet TAKE 1 TABLET BY MOUTH 3 TIMES DAILY AS NEEDED FOR ANXIETY 06/14/21 06/14/22  [name], MD  levothyroxine (SYNTHROID) 50 MCG tablet Take  1 tablet (50 mcg total) by mouth daily before breakfast. 09/18/21 03/17/22  [name], MD  linaclotide [name]) 290 MCG CAPS capsule Take 1 capsule (290 mcg total) by mouth daily before breakfast. 02/21/21   [name], [name], MD  midodrine (PROAMATINE) 5 MG tablet TAKE 1 TABLET BY MOUTH TWICE DAILY WITH A MEAL 04/21/21 04/21/22  [name], MD  ondansetron (ZOFRAN-ODT) 8 MG disintegrating tablet DISSOLVE 1 TABLET BY MOUTH EVERY 8 HOURS AS NEEDED FOR NAUSEA & VOMITING 08/06/21 08/06/22  [name], MD  Oxycodone HCl 10 MG TABS Take 1 tablet by mouth every 4 hours as needed for pain (max 6 tablets daily) 07/21/21   [name], MD  Oxycodone HCl 10 MG TABS Take 1 tablet by mouth every 4 hours as needed for pain (max 6 tablets daily).  Fill 08/17/21 or after. 08/17/21   [name], MD  Oxycodone HCl 10 MG TABS Take 1 tablet by mouth every 4 hours as needed for pain (max 6 tablets daily).  Fill 09/20/2021. 09/20/21   [name], MD  pantoprazole (PROTONIX) 40 MG tablet TAKE 1 TABLET BY MOUTH ONCE DAILY 04/21/21 04/21/22  [name], MD  predniSONE (DELTASONE) 5 MG tablet Take 1 tablet (5 mg total) by mouth in the morning. 06/21/21     solifenacin (VESICARE) 10 MG tablet Take 1 tablet  (10 mg total) by mouth daily. Patient not taking: Reported on 09/18/2021 06/27/21   Florian Buff, MD    Family History Family History  Problem Relation Age of Onset   Cardiomyopathy Mother    Breast cancer Mother    Prostate cancer Father    Neurofibromatosis Cousin    Heart disease Maternal Aunt    Bipolar disorder Daughter    Colon cancer Neg Hx     Social History Social History   Tobacco Use   Smoking status: Never   Smokeless tobacco: Never  Vaping Use   Vaping Use: Never used  Substance Use Topics   Alcohol use: No   Drug use: No     Allergies   Bee venom, Contrast media [iodinated contrast media], Bactrim [sulfamethoxazole-trimethoprim], Codeine, and Morphine and related   Review of Systems Review of Systems PER HPI  Physical Exam Triage Vital Signs ED Triage Vitals [09/18/21 1139]  Enc Vitals Group     BP 111/77     Pulse Rate 74     Resp 18     Temp 97.6 F (36.4 C)     Temp Source Oral     SpO2 94 %     Weight      Height      Head Circumference      Peak Flow      Pain Score      Pain Loc      Pain Edu?      Excl. in [place]?    No data found.  Updated Vital Signs BP 111/77 (BP Location: Right Arm)    Pulse 74    Temp 97.6 F (36.4 C) (Oral)    Resp 18    LMP 05/17/2016    SpO2 94%   Visual Acuity Right Eye Distance:   Left Eye Distance:   Bilateral Distance:    Right Eye Near:   Left Eye Near:    Bilateral Near:     Physical Exam Vitals and nursing note reviewed.  Constitutional:      General: She is not in acute  distress.    Appearance: She is not ill-appearing.  HENT:     Head: Atraumatic.     Right Ear: Tympanic membrane normal.     Left Ear: Tympanic membrane normal.     Nose: Rhinorrhea present.     Mouth/Throat:     Mouth: Mucous membranes are moist.     Pharynx: Posterior oropharyngeal erythema present. No oropharyngeal exudate.  Eyes:     Extraocular Movements: Extraocular movements intact.     Conjunctiva/sclera:  Conjunctivae normal.  Cardiovascular:     Rate and Rhythm: Normal rate and regular rhythm.     Heart sounds: Normal heart sounds.  Pulmonary:     Effort: Pulmonary effort is normal. No respiratory distress.     Breath sounds: Wheezing present.     Comments: Breathing comfortably on room air, but halfway through exam requests to be placed on oxygen via nasal cannula.  Oxygen saturation was 94% on room air, about 96% on 2 L of O2 via nasal cannula.  Diffuse wheezes throughout, no rales appreciable Abdominal:     General: Bowel sounds are normal. There is no distension.     Palpations: Abdomen is soft.     Tenderness: There is no abdominal tenderness. There is no guarding.  Musculoskeletal:        General: Normal range of motion.     Cervical back: Normal range of motion and neck supple.  Skin:    General: Skin is warm and dry.  Neurological:     Mental Status: She is alert and oriented to person, place, and time.  Psychiatric:        Mood and Affect: Mood normal.        Thought Content: Thought content normal.        Judgment: Judgment normal.     UC Treatments / Results  Labs (all labs ordered are listed, but only abnormal results are displayed) Labs Reviewed  COVID-19, FLU A+B NAA    EKG   Radiology No results found.  Procedures Procedures (including critical care time)  Medications Ordered in UC Medications  dexamethasone (DECADRON) injection 10 mg (10 mg Intramuscular Given 09/18/21 1253)    Initial Impression / Assessment and Plan / UC Course  I have reviewed the triage vital signs and the nursing notes.  Pertinent labs & imaging results that were available during my care of the patient were reviewed by me and considered in my medical decision making (see chart for details).     Vital signs overall very reassuring today, she appears in no acute distress today.  Suspect viral upper respiratory infection, possibly COVID.  COVID and flu testing pending, will treat  with IM Decadron, doxycycline given her high risk for developing pneumonia, antiviral for COVID while awaiting test results.  Discussed continued nebulizer and inhaler therapy, supportive over-the-counter measures.  Strict return precautions given for any acutely worsening symptoms.  Final Clinical Impressions(s) / UC Diagnoses   Final diagnoses:  Exposure to the flu  Lower respiratory infection  History of pulmonary fibrosis  SOB (shortness of breath)  Generalized body aches   Discharge Instructions   None    ED Prescriptions     Medication Sig Dispense Auth. Provider   doxycycline (VIBRAMYCIN) 100 MG capsule Take 1 capsule (100 mg total) by mouth 2 (two) times daily. 14 capsule Volney American, PA-C   molnupiravir EUA (LAGEVRIO) 200 mg CAPS capsule Take 4 capsules (800 mg total) by mouth 2 (two) times daily for 5  days. 40 capsule Volney American, [place]      PDMP not reviewed this encounter.   Merrie Roof [place], [place] 09/19/21 [phone]

## 2021-09-20 ENCOUNTER — Emergency Department (HOSPITAL_COMMUNITY)
Admission: EM | Admit: 2021-09-20 | Discharge: 2021-09-20 | Disposition: A | Discharge status: Home / Self Care | Payer: 59 | Attending: Emergency Medicine | Admitting: Emergency Medicine

## 2021-09-20 ENCOUNTER — Other Ambulatory Visit (HOSPITAL_COMMUNITY): Payer: Self-pay

## 2021-09-20 ENCOUNTER — Encounter (HOSPITAL_COMMUNITY): Payer: Self-pay | Admitting: Emergency Medicine

## 2021-09-20 ENCOUNTER — Emergency Department (HOSPITAL_COMMUNITY): Payer: 59

## 2021-09-20 ENCOUNTER — Other Ambulatory Visit: Payer: Self-pay

## 2021-09-20 DIAGNOSIS — E039 Hypothyroidism, unspecified: Secondary | ICD-10-CM | POA: Diagnosis not present

## 2021-09-20 DIAGNOSIS — B9789 Other viral agents as the cause of diseases classified elsewhere: Secondary | ICD-10-CM | POA: Diagnosis not present

## 2021-09-20 DIAGNOSIS — J069 Acute upper respiratory infection, unspecified: Secondary | ICD-10-CM | POA: Diagnosis not present

## 2021-09-20 DIAGNOSIS — J449 Chronic obstructive pulmonary disease, unspecified: Secondary | ICD-10-CM | POA: Diagnosis not present

## 2021-09-20 DIAGNOSIS — Z7951 Long term (current) use of inhaled steroids: Secondary | ICD-10-CM | POA: Insufficient documentation

## 2021-09-20 DIAGNOSIS — Z79899 Other long term (current) drug therapy: Secondary | ICD-10-CM | POA: Diagnosis not present

## 2021-09-20 DIAGNOSIS — R059 Cough, unspecified: Secondary | ICD-10-CM | POA: Diagnosis not present

## 2021-09-20 MED ORDER — ONDANSETRON 4 MG PO TBDP: 4.0000 mg | ORAL_TABLET | Freq: Three times a day (TID) | ORAL | 0 refills | Status: DC | PRN

## 2021-09-20 MED ORDER — ONDANSETRON 4 MG PO TBDP
4.0000 mg | ORAL_TABLET | Freq: Once | ORAL | Status: AC
Start: 2021-09-20 — End: 2021-09-20
  Administered 2021-09-20: 07:00:00 4 mg via ORAL
  Filled 2021-09-20: qty 1

## 2021-09-20 MED ORDER — ALBUTEROL SULFATE HFA 108 (90 BASE) MCG/ACT IN AERS
2.0000 | INHALATION_SPRAY | Freq: Once | RESPIRATORY_TRACT | Status: AC
  Administered 2021-09-20: 07:00:00 2 via RESPIRATORY_TRACT
  Filled 2021-09-20: qty 6.7

## 2021-09-20 NOTE — Discharge Instructions (Signed)
You were seen today for ongoing cough.  I reviewed your records.  Your COVID and flu negative.  You can discontinue your antiviral.  Continue steroids and doxycycline.  You will be given Zofran for vomiting.

## 2021-09-20 NOTE — ED Provider Notes (Signed)
[facility] EMERGENCY DEPARTMENT Provider Note   CSN: [id number] Arrival date & time: 09/20/21  0602     History  Chief Complaint  Patient presents with   Nasal Congestion   Cough    Kathleen [name] is a 61 y.o. female.  HPI     This is a 61 year old female with a history of sarcoid, restrictive lung disease, adrenal insufficiency, hypothyroidism who presents with cough, body aches, nausea and vomiting.  Patient states that she believes that she has COVID-19.  She reports loss of sense of taste and smell.  She was seen and evaluated on Monday urgent care.  At that time COVID and influenza testing were pending.  She was given steroids, doxycycline, and antiviral medication for presumptive COVID-19.  She states that she has had posttussive emesis and has had difficulty keeping down her meds.  She uses oxygen intermittently at home for comfort.  She also has nebulizers.  However, she continues to cough and cough is sometimes productive.  No fevers but has had chills.  Chart reviewed.  COVID and influenza testing from urgent care negative.  Home Medications Prior to Admission medications   Medication Sig Start Date End Date Taking? Authorizing Provider  ondansetron (ZOFRAN-ODT) 4 MG disintegrating tablet Take 1 tablet (4 mg total) by mouth every 8 (eight) hours as needed for nausea or vomiting. 09/20/21  Yes Anfernee Peschke, Barbette Hair, MD  acetaminophen (TYLENOL) 500 MG tablet Take 1,000 mg by mouth every 8 (eight) hours as needed for headache.    [provider]  albuterol (VENTOLIN HFA) 108 (90 Base) MCG/ACT inhaler Inhale 2 puffs into the lungs every 6 (six) hours as needed for wheezing or shortness of breath. 05/22/21   Roxan Hockey, MD  ascorbic acid (VITAMIN C) 500 MG tablet Take 1 tablet (500 mg total) by mouth daily. 05/23/21   Roxan Hockey, MD  budesonide (PULMICORT) 0.5 MG/2ML nebulizer solution Inhale 2 mL (0.5 mg total) by nebulization two (2) times a day as needed.  06/08/21     cholestyramine (QUESTRAN) 4 g packet Mix 1 packet as directed and take by mouth twice daily. 05/09/21   [name], [name], MD  docusate sodium (COLACE) 100 MG capsule Take 100 mg by mouth daily.    [provider]  doxycycline (VIBRAMYCIN) 100 MG capsule Take 1 capsule (100 mg total) by mouth 2 (two) times daily. 09/18/21   Volney American, PA-C  EPINEPHrine 0.3 mg/0.3 mL IJ SOAJ injection Inject 0.3 mLs (0.3 mg total) into the muscle as needed for anaphylaxis. 02/26/20   [name], MD  ferrous sulfate 325 (65 FE) MG EC tablet Take 325 mg by mouth daily with breakfast.    [provider]  FLUoxetine (PROZAC) 20 MG capsule Take 3 capsules by mouth once a day. 06/14/21   [name], MD  formoterol (PERFOROMIST) 20 MCG/2ML nebulizer solution Inhale 1 vial via nebulizer two times a day. 06/21/21     hydrocortisone (CORTEF) 10 MG tablet Take 2 tablets by mouth at 8:00 am and 1 tablet at lunch 08/17/21   [name], MD  hydrOXYzine (ATARAX) 25 MG tablet TAKE 1 TABLET BY MOUTH 3 TIMES DAILY AS NEEDED FOR ANXIETY 06/14/21 06/14/22  [name], MD  levothyroxine (SYNTHROID) 50 MCG tablet Take 1 tablet (50 mcg total) by mouth daily before breakfast. 09/18/21 03/17/22  [name], MD  linaclotide (LINZESS) 290 MCG CAPS capsule Take 1 capsule (290 mcg total) by mouth daily  before breakfast. 02/21/21   [name], [name], MD  midodrine (PROAMATINE) 5 MG tablet TAKE 1 TABLET BY MOUTH TWICE DAILY WITH A MEAL 04/21/21 04/21/22  [name], MD  molnupiravir EUA (LAGEVRIO) 200 mg CAPS capsule Take 4 capsules (800 mg total) by mouth 2 (two) times daily for 5 days. 09/18/21 09/23/21  Volney American, PA-C  Oxycodone HCl 10 MG TABS Take 1 tablet by mouth every 4 hours as needed for pain (max 6 tablets daily) 07/21/21   [name], MD  Oxycodone HCl 10 MG TABS Take 1 tablet by mouth every 4 hours as needed for pain (max 6 tablets daily).  Fill  08/17/21 or after. 08/17/21   [name], MD  Oxycodone HCl 10 MG TABS Take 1 tablet by mouth every 4 hours as needed for pain (max 6 tablets daily).  Fill 09/20/2021. 09/20/21   [name], MD  pantoprazole (PROTONIX) 40 MG tablet TAKE 1 TABLET BY MOUTH ONCE DAILY 04/21/21 04/21/22  [name], MD  predniSONE (DELTASONE) 5 MG tablet Take 1 tablet (5 mg total) by mouth in the morning. 06/21/21     solifenacin (VESICARE) 10 MG tablet Take 1 tablet (10 mg total) by mouth daily. Patient not taking: Reported on 09/18/2021 06/27/21   Florian Buff, MD      Allergies    Bee venom, Contrast media [iodinated contrast media], Bactrim [sulfamethoxazole-trimethoprim], Codeine, and Morphine and related    Review of Systems   Review of Systems  Constitutional:  Positive for chills. Negative for fever.  Respiratory:  Positive for cough.   Gastrointestinal:  Positive for vomiting.  All other systems reviewed and are negative.  Physical Exam Updated Vital Signs BP 138/65 (BP Location: Right Arm)    Pulse 78    Temp 97.8 F (36.6 C) (Oral)    Resp 18    Ht 1.778 m (5\' 10" )    Wt 104.9 kg    LMP 05/17/2016    SpO2 97%    BMI 33.17 kg/m  Physical Exam Vitals and nursing note reviewed.  Constitutional:      Appearance: She is well-developed. She is obese. She is not ill-appearing.  HENT:     Head: Normocephalic and atraumatic.     Nose: Nose normal.     Mouth/Throat:     Mouth: Mucous membranes are moist.  Eyes:     Pupils: Pupils are equal, round, and reactive to light.  Cardiovascular:     Rate and Rhythm: Normal rate and regular rhythm.     Heart sounds: Normal heart sounds.  Pulmonary:     Effort: Pulmonary effort is normal. No respiratory distress.     Breath sounds: Wheezing present.     Comments: Coughing, no acute respiratory distress, occasional expiratory wheeze noted Abdominal:     Palpations: Abdomen is soft.  Musculoskeletal:     Cervical back: Neck supple.     Right  lower leg: No edema.     Left lower leg: No edema.  Skin:    General: Skin is warm and dry.  Neurological:     Mental Status: She is alert and oriented to person, place, and time.  Psychiatric:        Mood and Affect: Mood normal.    ED Results / Procedures / Treatments   Labs (all labs ordered are listed, but only abnormal results are displayed) Labs Reviewed - No data to display  EKG None  Radiology No  results found.  Procedures Procedures    Medications Ordered in ED Medications  ondansetron (ZOFRAN-ODT) disintegrating tablet 4 mg (4 mg Oral Given 09/20/21 0634)  albuterol (VENTOLIN HFA) 108 (90 Base) MCG/ACT inhaler 2 puff (2 puffs Inhalation Given 09/20/21 7510)    ED Course/ Medical Decision Making/ A&P                           Medical Decision Making Amount and/or Complexity of Data Reviewed Radiology: ordered.  Risk Prescription drug management.   This patient presents to the ED for concern of ongoing cough, congestion, this involves an extensive number of treatment options, and is a complaint that carries with it a high risk of complications and morbidity.  The differential diagnosis includes viral upper respiratory infection including COVID and influenza, pneumonia, exacerbation of restrictive lung disease, less likely CHF  MDM:    This is a 61 year old female who presents with ongoing cough congestion, body aches.  She is nontoxic.  Vital signs are reassuring.  She is afebrile.  Chart reviewed.  Had negative COVID and influenza testing 2 days ago.  States she has had difficulty keeping down her antibiotic for presumed pneumonia because of vomiting.  She has had some posttussive emesis.  She does not appear clinically dry.  We will obtain a chest x-ray.  She was given Zofran and albuterol for her ongoing wheeze.  X-ray independently reviewed by myself.  She has some blunting of the costophrenic angles.  Will await formal read. (Labs, imaging)  Labs: I  Ordered, and personally interpreted labs.  The pertinent results include: None  Imaging Studies ordered: I ordered imaging studies including x-ray I independently visualized and interpreted imaging. I agree with the radiologist interpretation  Additional history obtained from chart review.  External records from outside source obtained and reviewed including prior visits  Critical Interventions: Albuterol HFA  Consultations: I requested consultation with the none,  and discussed lab and imaging findings as well as pertinent plan - they recommend: None  Cardiac Monitoring: The patient was maintained on a cardiac monitor.  I personally viewed and interpreted the cardiac monitored which showed an underlying rhythm of: Normal sinus rhythm  Reevaluation: After the interventions noted above, I reevaluated the patient and found that they have :improved   Considered admission for:  n/a  Social Determinants of Health: Lives independently  Disposition: Likely discharge  Co morbidities that complicate the patient evaluation  Past Medical History:  Diagnosis Date   Adrenal insufficiency ([place])    Anemia    Anxiety    Avascular bone necrosis ([place])    Breast fibrocystic disorder    Chronic pain    COPD (chronic obstructive pulmonary disease) ([place])    Encounter for long-term opiate analgesic use 02/02/2020   Patient has chronic pain with femoral head avascular necrosis.  Is under pain contract   Fibromyalgia    Gastroesophageal reflux disease    Hypothyroidism    Mitral valve prolapse    Orthostatic hypotension    Peripheral neuropathy    PONV (postoperative nausea and vomiting)    Restrictive lung disease    Moderate to severe   Sarcoidosis    Biopsy proven - [facility]   SOB (shortness of breath)    chronic     Medicines Meds ordered this encounter  Medications   ondansetron (ZOFRAN-ODT) disintegrating tablet 4 mg   albuterol (VENTOLIN HFA) 108 (90 Base) MCG/ACT inhaler 2 puff  ondansetron (ZOFRAN-ODT) 4 MG disintegrating tablet    Sig: Take 1 tablet (4 mg total) by mouth every 8 (eight) hours as needed for nausea or vomiting.    Dispense:  20 tablet    Refill:  0    I have reviewed the patients home medicines and have made adjustments as needed  Problem List / ED Course: Problem List Items Addressed This Visit   None Visit Diagnoses     Viral URI with cough    -  Primary                   Final Clinical Impression(s) / ED Diagnoses Final diagnoses:  Viral URI with cough    Rx / DC Orders ED Discharge Orders          Ordered    ondansetron (ZOFRAN-ODT) 4 MG disintegrating tablet  Every 8 hours PRN        09/20/21 0650              Merryl Hacker, MD 09/20/21 [phone]

## 2021-09-20 NOTE — ED Triage Notes (Signed)
Pt c/o cough, congestion, headache and body aches for the past several days. Pt seen at [facility] yesterday for the same, states she was tested for COVID but hasn't got her results back.

## 2021-09-21 DIAGNOSIS — D860 Sarcoidosis of lung: Secondary | ICD-10-CM | POA: Diagnosis not present

## 2021-09-21 DIAGNOSIS — J849 Interstitial pulmonary disease, unspecified: Secondary | ICD-10-CM | POA: Diagnosis not present

## 2021-09-21 DIAGNOSIS — J9601 Acute respiratory failure with hypoxia: Secondary | ICD-10-CM | POA: Diagnosis not present

## 2021-09-21 DIAGNOSIS — M6281 Muscle weakness (generalized): Secondary | ICD-10-CM | POA: Diagnosis not present

## 2021-09-25 ENCOUNTER — Other Ambulatory Visit (HOSPITAL_COMMUNITY): Payer: Self-pay

## 2021-10-06 ENCOUNTER — Other Ambulatory Visit: Payer: Self-pay | Admitting: "Endocrinology

## 2021-10-06 ENCOUNTER — Other Ambulatory Visit: Payer: Self-pay | Admitting: Family Medicine

## 2021-10-06 ENCOUNTER — Other Ambulatory Visit (HOSPITAL_COMMUNITY): Payer: Self-pay

## 2021-10-06 MED ORDER — HYDROCORTISONE 10 MG PO TABS
ORAL_TABLET | ORAL | 1 refills | Status: DC
  Filled 2021-10-06: qty 12, 4d supply, fill #0
  Filled 2021-10-06: qty 78, 26d supply, fill #0

## 2021-10-06 MED ORDER — PANTOPRAZOLE SODIUM 40 MG PO TBEC
DELAYED_RELEASE_TABLET | Freq: Every day | ORAL | 1 refills | Status: DC
  Filled 2021-10-06: qty 90, fill #0

## 2021-10-06 MED ORDER — HYDROCORTISONE 10 MG PO TABS
ORAL_TABLET | ORAL | 3 refills | Status: DC
  Filled 2021-10-11: qty 120, 30d supply, fill #0
  Filled 2021-11-11: qty 120, 30d supply, fill #1
  Filled 2021-12-11: qty 120, 30d supply, fill #2
  Filled 2022-01-10: qty 120, 30d supply, fill #3

## 2021-10-11 ENCOUNTER — Other Ambulatory Visit (HOSPITAL_COMMUNITY): Payer: Self-pay

## 2021-10-13 ENCOUNTER — Other Ambulatory Visit (HOSPITAL_COMMUNITY): Payer: Self-pay

## 2021-10-13 ENCOUNTER — Other Ambulatory Visit: Payer: Self-pay

## 2021-10-13 ENCOUNTER — Ambulatory Visit (INDEPENDENT_AMBULATORY_CARE_PROVIDER_SITE_OTHER): Payer: 59 | Admitting: Family Medicine

## 2021-10-13 VITALS — BP 120/62 | HR 110 | Temp 97.7°F | Ht 70.0 in | Wt 232.4 lb

## 2021-10-13 DIAGNOSIS — M87052 Idiopathic aseptic necrosis of left femur: Secondary | ICD-10-CM

## 2021-10-13 DIAGNOSIS — Z79891 Long term (current) use of opiate analgesic: Secondary | ICD-10-CM

## 2021-10-13 DIAGNOSIS — R110 Nausea: Secondary | ICD-10-CM

## 2021-10-13 DIAGNOSIS — I272 Pulmonary hypertension, unspecified: Secondary | ICD-10-CM | POA: Diagnosis not present

## 2021-10-13 MED ORDER — MIDODRINE HCL 5 MG PO TABS
ORAL_TABLET | Freq: Two times a day (BID) | ORAL | 5 refills | Status: DC
  Filled 2021-10-13: qty 60, 30d supply, fill #0
  Filled 2021-11-29: qty 60, 30d supply, fill #1
  Filled 2022-01-26: qty 60, 30d supply, fill #2
  Filled 2022-04-06: qty 10, 5d supply, fill #3
  Filled 2022-04-06: qty 50, 25d supply, fill #3
  Filled 2022-05-08: qty 50, 25d supply, fill #4
  Filled 2022-05-08: qty 10, 5d supply, fill #4
  Filled 2022-07-04: qty 60, 30d supply, fill #5

## 2021-10-13 MED ORDER — OXYCODONE HCL 10 MG PO TABS
ORAL_TABLET | ORAL | 0 refills | Status: DC
  Filled 2021-10-13: qty 180, fill #0
  Filled 2021-12-18: qty 180, 30d supply, fill #0

## 2021-10-13 MED ORDER — OXYCODONE HCL 10 MG PO TABS
ORAL_TABLET | ORAL | 0 refills | Status: DC
  Filled 2021-10-13: qty 180, fill #0
  Filled 2021-10-20: qty 180, 30d supply, fill #0

## 2021-10-13 MED ORDER — OXYCODONE HCL 10 MG PO TABS
ORAL_TABLET | ORAL | 0 refills | Status: DC
  Filled 2021-10-13: qty 180, fill #0
  Filled 2021-11-18: qty 180, 30d supply, fill #0

## 2021-10-13 MED ORDER — PANTOPRAZOLE SODIUM 40 MG PO TBEC
DELAYED_RELEASE_TABLET | Freq: Every day | ORAL | 1 refills | Status: DC
  Filled 2021-10-13: qty 90, fill #0
  Filled 2021-11-03: qty 90, 90d supply, fill #0

## 2021-10-13 NOTE — Progress Notes (Signed)
Subjective:    Patient ID: Kathleen [name], female    DOB: Sep 05, 1960, 61 y.o.   MRN: [id number]  HPI  Patient here for follow up on pain medication.  This patient was seen today for chronic pain  The medication list was reviewed and updated.  Location of Pain for which the patient has been treated with regarding narcotics: Hip  Onset of this pain: Present for years   -Compliance with medication: Compliant  - Number patient states they take daily: Maximum of 6  -when was the last dose patient took?  Earlier today  The patient was advised the importance of maintaining medication and not using illegal substances with these.  Here for refills and follow up  The patient was educated that we can provide 3 monthly scripts for their medication, it is their responsibility to follow the instructions.  Side effects or complications from medications: Denies side effects  Patient is aware that pain medications are meant to minimize the severity of the pain to allow their pain levels to improve to allow for better function. They are aware of that pain medications cannot totally remove their pain.  Due for UDT ( at least once per year) : On next visit  Scale of 1 to 10 ( 1 is least 10 is most) Your pain level without the medicine: 8 Your pain level with medication 4  Scale 1 to 10 ( 1-helps very little, 10 helps very well) How well does your pain medication reduce your pain so you can function better through out the day? 7  Quality of the pain: Throbbing aching painful with movement  Persistence of the pain: Present all the time  Modifying factors: Worse with activity       The 10-year ASCVD risk score (Arnett DK, et al., 2019) is: 4%   Values used to calculate the score:     Age: 29 years     Sex: Female     Is Non-Hispanic African American: No     Diabetic: No     Tobacco smoker: No     Systolic Blood Pressure: 101 mmHg     Is BP treated: No     HDL Cholesterol: 61  mg/dL     Total Cholesterol: 253 mg/dL  Review of Systems     Objective:   Physical Exam General-in no acute distress Eyes-no discharge Lungs-respiratory rate normal, CTA CV-no murmurs,RRR Extremities skin warm dry no edema Neuro grossly normal Behavior normal, alert        Assessment & Plan:  1. Avascular necrosis of bone of hip, left ([place]) Patient with significant avascular necrosis of the left hip unfortunately orthopedics states that they can no longer do surgery  2. Encounter for long-term opiate analgesic use The patient was seen in followup for chronic pain. A review over at their current pain status was discussed. Drug registry was checked. Prescriptions were given.  Regular follow-up recommended. Discussion was held regarding the importance of compliance with medication as well as pain medication contract.  Patient was informed that medication may cause drowsiness and should not be combined  with other medications/alcohol or street drugs. If the patient feels medication is causing altered alertness then do not drive or operate dangerous equipment.  Should be noted that the patient appears to be meeting appropriate use of opioids and response.  Evidenced by improved function and decent pain control without significant side effects and no evidence of overt aberrancy issues.  Upon discussion with the  patient today they understand that opioid therapy is optional and they feel that the pain has been refractory to reasonable conservative measures and is significant and affecting quality of life enough to warrant ongoing therapy and wishes to continue opioids.  Refills were provided.  - ToxASSURE Select 13 (MW), Urine  3. Nausea Patient with persistent nausea issues and finds himself feeling very sick on the stomach and requesting see GI this been chronic.  Recommend Zofran as needed for now.  There could be some level of gastroparesis going on related to the amount of opioids  she is on - Ambulatory referral to Gastroenterology  4. Pulmonary HTN ([place]) Followed by Dr. Farley Ly for interstitial lung disease at [facility]  Patient's long-term status stable currently.  Moderate weight issues talked about healthy eating portion control  Follow-up 3 months

## 2021-10-18 ENCOUNTER — Other Ambulatory Visit (HOSPITAL_COMMUNITY): Payer: Self-pay

## 2021-10-18 LAB — TOXASSURE SELECT 13 (MW), URINE

## 2021-10-18 LAB — SPECIMEN STATUS REPORT

## 2021-10-19 DIAGNOSIS — D860 Sarcoidosis of lung: Secondary | ICD-10-CM | POA: Diagnosis not present

## 2021-10-19 DIAGNOSIS — M6281 Muscle weakness (generalized): Secondary | ICD-10-CM | POA: Diagnosis not present

## 2021-10-19 DIAGNOSIS — J9601 Acute respiratory failure with hypoxia: Secondary | ICD-10-CM | POA: Diagnosis not present

## 2021-10-19 DIAGNOSIS — J849 Interstitial pulmonary disease, unspecified: Secondary | ICD-10-CM | POA: Diagnosis not present

## 2021-10-20 ENCOUNTER — Other Ambulatory Visit (HOSPITAL_COMMUNITY): Payer: Self-pay

## 2021-10-20 DIAGNOSIS — J449 Chronic obstructive pulmonary disease, unspecified: Secondary | ICD-10-CM | POA: Diagnosis not present

## 2021-10-20 DIAGNOSIS — J441 Chronic obstructive pulmonary disease with (acute) exacerbation: Secondary | ICD-10-CM | POA: Diagnosis not present

## 2021-10-22 DIAGNOSIS — D860 Sarcoidosis of lung: Secondary | ICD-10-CM | POA: Diagnosis not present

## 2021-10-22 DIAGNOSIS — J9601 Acute respiratory failure with hypoxia: Secondary | ICD-10-CM | POA: Diagnosis not present

## 2021-10-22 DIAGNOSIS — J849 Interstitial pulmonary disease, unspecified: Secondary | ICD-10-CM | POA: Diagnosis not present

## 2021-10-22 DIAGNOSIS — M6281 Muscle weakness (generalized): Secondary | ICD-10-CM | POA: Diagnosis not present

## 2021-10-28 ENCOUNTER — Other Ambulatory Visit: Payer: Self-pay | Admitting: Family Medicine

## 2021-10-31 ENCOUNTER — Other Ambulatory Visit: Payer: Self-pay

## 2021-10-31 ENCOUNTER — Ambulatory Visit (INDEPENDENT_AMBULATORY_CARE_PROVIDER_SITE_OTHER): Payer: 59 | Admitting: Family Medicine

## 2021-10-31 VITALS — BP 127/81 | HR 71 | Temp 97.8°F | Ht 70.0 in | Wt 237.6 lb

## 2021-10-31 DIAGNOSIS — L02412 Cutaneous abscess of left axilla: Secondary | ICD-10-CM | POA: Diagnosis not present

## 2021-10-31 MED ORDER — DOXYCYCLINE HYCLATE 100 MG PO TABS: 100.0000 mg | ORAL_TABLET | Freq: Two times a day (BID) | ORAL | 0 refills | Status: DC

## 2021-10-31 NOTE — Patient Instructions (Signed)
Warm compresses ?10 to 20 minutes at a time every 2 hours while awake ? ?Take doxycycline 100 one 2 times a day with snack and 8 oz of water  ? ?Call or follow up if problems ?

## 2021-10-31 NOTE — Progress Notes (Signed)
? ?  Subjective:  ? ? Patient ID: Kathleen [name], female    DOB: Mar 14, 1961, 61 y.o.   MRN: [id number] ? ?HPI ? ?Patient here for abscess under left arm. Patient says it's really painful. No drainage. ?She noticed this a few days ago.  Causing pain discomfort swelling.  States she cannot tolerate the pain well. ?Review of Systems ? ?   ?Objective:  ? Physical Exam ?Has a small nodular abscess underneath the left arm red tender swollen ? ?Procedure note ?Patient was made aware what we are doing she has no questions ?She was treated with 1% lidocaine with epinephrine subcutaneous 1 mL ?Betadine swabs to the area ?Under sterile condition #11 blade was used to make an incision ?Small amount of pus obtained ?Culture sent ?No bleeding issues ?Too small for packing ?Dressing was applied ?Follow-up as needed ? ?   ?Assessment & Plan:  ?Abscess ?I&D ?Warm compresses frequently ?Antibiotic twice daily ?Follow-up in 48 hours if getting worse or not better ? ?

## 2021-11-03 ENCOUNTER — Other Ambulatory Visit (HOSPITAL_COMMUNITY): Payer: Self-pay

## 2021-11-03 LAB — WOUND CULTURE: Organism ID, Bacteria: NONE SEEN

## 2021-11-03 LAB — SPECIMEN STATUS REPORT

## 2021-11-07 ENCOUNTER — Other Ambulatory Visit: Payer: Self-pay

## 2021-11-07 ENCOUNTER — Other Ambulatory Visit (HOSPITAL_COMMUNITY): Payer: Self-pay

## 2021-11-07 ENCOUNTER — Encounter (INDEPENDENT_AMBULATORY_CARE_PROVIDER_SITE_OTHER): Payer: Self-pay | Admitting: Internal Medicine

## 2021-11-07 ENCOUNTER — Ambulatory Visit (INDEPENDENT_AMBULATORY_CARE_PROVIDER_SITE_OTHER): Payer: 59 | Admitting: Internal Medicine

## 2021-11-07 VITALS — BP 106/58 | HR 67 | Temp 97.9°F | Ht 70.0 in | Wt 237.0 lb

## 2021-11-07 DIAGNOSIS — K581 Irritable bowel syndrome with constipation: Secondary | ICD-10-CM

## 2021-11-07 DIAGNOSIS — K219 Gastro-esophageal reflux disease without esophagitis: Secondary | ICD-10-CM | POA: Diagnosis not present

## 2021-11-07 DIAGNOSIS — R112 Nausea with vomiting, unspecified: Secondary | ICD-10-CM

## 2021-11-07 DIAGNOSIS — R1010 Upper abdominal pain, unspecified: Secondary | ICD-10-CM | POA: Diagnosis not present

## 2021-11-07 MED ORDER — PANTOPRAZOLE SODIUM 40 MG PO TBEC
40.0000 mg | DELAYED_RELEASE_TABLET | Freq: Two times a day (BID) | ORAL | 5 refills | Status: DC
  Filled 2021-11-07 – 2022-02-13 (×2): qty 60, 30d supply, fill #0
  Filled 2022-04-06: qty 60, 30d supply, fill #1
  Filled 2022-05-08: qty 60, 30d supply, fill #2
  Filled 2022-08-02: qty 60, 30d supply, fill #3
  Filled 2022-09-20: qty 60, 30d supply, fill #4

## 2021-11-07 MED ORDER — BISACODYL 10 MG RE SUPP: 10.0000 mg | Freq: Every day | RECTAL | 0 refills | Status: DC

## 2021-11-07 NOTE — Progress Notes (Signed)
Presenting complaint; ? ?Abdominal pain nausea vomiting and constipation. ? ?Database and subjective: ? ?Patient is 61 year old Caucasian female with multiple medical problems including adrenal cortical insufficiency fibromyalgia COPD chronic GERD abdominal pain constipation/IBS who is here for scheduled visit.   ?Following her visit in in June 2022 she underwent esophagogastroduodenoscopy in July 2022 revealing mild gastritis.  Gastric biopsy revealed reactive changes but no evidence of H. pylori.  Her esophagus was dilated given history of dysphagia. ?Prior ultrasound was negative for cholelithiasis.  HIDA scan on 03/14/2021 revealed EF of 92% and her symptoms were reproduced.  She she underwent cholecystectomy by Dr. Arnoldo Morale on 04/17/2021 and started to feel better.  She was seen on 05/09/2021 for diarrhea and treated with cholestyramine.  Gradually she returned back to her baseline of constipation and now she is back on Linzess. ? ?Patient has multiple complaints.  She states she vomits every day.  Vomiting always starts after 3 or 4 PM and what she throws up his food that she had recently eaten.  She has not experienced hematemesis or melena.  She eats 2 meals a day.  She says she generally is on a limited diet such as grilled chicken peanut butter applesauce tea and bananas.  She continues to complain of pain across her upper abdomen.  She also has severe left hip pain.  She has avascular necrosis.  She now has bowel movement every 3 days.  She may have occasional spell of diarrhea but it is self-limiting. ?She has tried gluten-free diet in the past but it did not make any difference as well as her nausea vomiting or abdominal pain is concerned.  She states vomiting does not always ameliorate her abdominal pain.  She has tried OTC antacids and Mylanta helps sometimes.  Similarly if she has a bowel movement it helps. ?She has not lost any weight since her last visit. ? ? ?Current Medications: ?Outpatient Encounter  Medications as of 11/07/2021  ?Medication Sig  ? acetaminophen (TYLENOL) 500 MG tablet Take 1,000 mg by mouth every 8 (eight) hours as needed for headache.  ? albuterol (VENTOLIN HFA) 108 (90 Base) MCG/ACT inhaler Inhale 2 puffs into the lungs every 6 (six) hours as needed for wheezing or shortness of breath.  ? ascorbic acid (VITAMIN C) 500 MG tablet Take 1 tablet (500 mg total) by mouth daily.  ? budesonide (PULMICORT) 0.5 MG/2ML nebulizer solution Inhale 2 mL (0.5 mg total) by nebulization two (2) times a day as needed.  ? docusate sodium (COLACE) 100 MG capsule Take 100 mg by mouth daily.  ? doxycycline (VIBRA-TABS) 100 MG tablet Take 1 tablet (100 mg total) by mouth 2 (two) times daily.  ? EPINEPHrine 0.3 mg/0.3 mL IJ SOAJ injection Inject 0.3 mLs (0.3 mg total) into the muscle as needed for anaphylaxis.  ? ferrous sulfate 325 (65 FE) MG EC tablet Take 325 mg by mouth daily with breakfast.  ? FLUoxetine (PROZAC) 20 MG capsule Take 3 capsules by mouth once a day.  ? formoterol (PERFOROMIST) 20 MCG/2ML nebulizer solution Inhale 1 vial via nebulizer two times a day.  ? hydrocortisone (CORTEF) 10 MG tablet Take 2 tablets by mouth at 8:00 am and 1 tablet at lunch. (May double dose on sick days x 3-5 days)  ? hydrOXYzine (ATARAX) 25 MG tablet TAKE 1 TABLET BY MOUTH 3 TIMES DAILY AS NEEDED FOR ANXIETY  ? levothyroxine (SYNTHROID) 50 MCG tablet Take 1 tablet (50 mcg total) by mouth daily before breakfast.  ? linaclotide (  LINZESS) 290 MCG CAPS capsule Take 1 capsule (290 mcg total) by mouth daily before breakfast.  ? midodrine (PROAMATINE) 5 MG tablet TAKE 1 TABLET BY MOUTH TWICE DAILY WITH A MEAL  ? ondansetron (ZOFRAN-ODT) 4 MG disintegrating tablet Take 1 tablet (4 mg total) by mouth every 8 (eight) hours as needed for nausea or vomiting.  ? Oxycodone HCl 10 MG TABS Take 1 tablet by mouth every 4 hours as needed for pain (max 6 tablets daily)*10/20/21  ? Oxycodone HCl 10 MG TABS Take 1 tablet by mouth every 4 hours as  needed for pain (max 6 tablets daily). *11/18/21  ? Oxycodone HCl 10 MG TABS Take 1 tablet by mouth every 4 hours as needed for pain (max 6 tablets daily). *12/18/21  ? pantoprazole (PROTONIX) 40 MG tablet TAKE 1 TABLET BY MOUTH ONCE DAILY  ? predniSONE (DELTASONE) 5 MG tablet Take 1 tablet (5 mg total) by mouth in the morning.  ? [DISCONTINUED] cholestyramine (QUESTRAN) 4 g packet Mix 1 packet as directed and take by mouth twice daily. (Patient not taking: Reported on 10/31/2021)  ? [DISCONTINUED] solifenacin (VESICARE) 10 MG tablet Take 1 tablet (10 mg total) by mouth daily. (Patient not taking: Reported on 09/18/2021)  ? ?No facility-administered encounter medications on file as of 11/07/2021.  ? ? ? ?Objective: ?Blood pressure (!) 106/58, pulse 67, temperature 97.9 ?F (36.6 ?C), temperature source Oral, height 5' 10"  (1.778 m), weight 237 lb (107.5 kg), last menstrual period 05/17/2016. ?Patient is alert and in no acute distress. ?Conjunctiva is pink. Sclera is nonicteric ?Oropharyngeal mucosa is normal. ?She has upper and lower dentures. ?No neck masses or thyromegaly noted. ?Cardiac exam with regular rhythm normal S1 and S2. No murmur or gallop noted. ?Lungs are clear to auscultation. ?Abdomen is full.  Bowel sounds are normal.  No bruits noted.  On palpation she has tenderness across upper half of her abdomen.  It is mild to moderate without any guarding.  No organomegaly or masses. ?No LE edema or clubbing noted. ? ?Labs/studies Results: ? ? ?CBC Latest Ref Rng & Units 05/22/2021 05/21/2021 03/30/2021  ?WBC 4.0 - 10.5 K/uL 7.0 6.7 8.1  ?Hemoglobin 12.0 - 15.0 g/dL 11.6(L) 12.4 12.1  ?Hematocrit 36.0 - 46.0 % 37.2 38.3 38.7  ?Platelets 150 - 400 K/uL 310 325 446(H)  ?  ?CMP Latest Ref Rng & Units 09/14/2021 08/04/2021 05/22/2021  ?Glucose 70 - 99 mg/dL 123(H) 108(H) 161(H)  ?BUN 6 - 24 mg/dL 17 11 13   ?Creatinine 0.57 - 1.00 mg/dL 1.02(H) 0.99 0.95  ?Sodium 134 - 144 mmol/L 134 137 140  ?Potassium 3.5 - 5.2 mmol/L  5.4(H) 5.2 4.4  ?Chloride 96 - 106 mmol/L 95(L) 98 106  ?CO2 20 - 29 mmol/L 22 24 27   ?Calcium 8.7 - 10.2 mg/dL 9.9 9.8 8.7(L)  ?Total Protein 6.0 - 8.5 g/dL 7.0 - 6.1(L)  ?Total Bilirubin 0.0 - 1.2 mg/dL 0.3 - 0.4  ?Alkaline Phos 44 - 121 IU/L 99 - 62  ?AST 0 - 40 IU/L 11 - 18  ?ALT 0 - 32 IU/L 12 - 17  ?  ?Hepatic Function Latest Ref Rng & Units 09/14/2021 05/22/2021 05/21/2021  ?Total Protein 6.0 - 8.5 g/dL 7.0 6.1(L) 6.8  ?Albumin 3.8 - 4.9 g/dL 4.5 3.2(L) 3.6  ?AST 0 - 40 IU/L 11 18 16   ?ALT 0 - 32 IU/L 12 17 18   ?Alk Phosphatase 44 - 121 IU/L 99 62 66  ?Total Bilirubin 0.0 - 1.2 mg/dL 0.3 0.4  0.4  ?Bilirubin, Direct 0.00 - 0.40 mg/dL - - -  ?  ? ? ? ?Assessment: ? ?#1.  Chronic nausea and vomiting.  It is very interesting to know that the symptoms occur after 3 or 4 PM.  This pattern would suggest gastroparesis.  However gastric emptying study in January 2020 was normal.  Since recent EGD failed to show peptic ulcer disease or pyloric stenosis gastric emptying study should be repeated.  If gastric emptying study is normal would check with Dr. Dorris Fetch if she needs higher dose of hydrocortisone in the afternoon. ? ?#2.  Chronic abdominal pain.  I suspect she has IBS.  It could also be neuropathic pain given history of of neuropathy.  Patient is already on multiple psychotropic medications and narcotic.  It remains to be seen if having more frequent bowel movements would help. ? ?#3.  IBS/constipation.  She is on Linzess and having bowel movement every 3 days. ? ?#4.  Chronic GERD.  Heartburn is well controlled with therapy. ? ? ?Plan: ? ?Proceed with gastric emptying study. ?Patient advised to use Dulcolax suppository every day for 2 weeks and see if having daily bowel movement alleviates her decreases abdominal pain nausea and vomiting.   ?Office visit in 3 months. ? ? ? ? ? ?

## 2021-11-07 NOTE — Patient Instructions (Signed)
Physician will call with results of gastric emptying study when completed. ?Use Dulcolax suppository every day for 2 weeks and see if having daily bowel movement daily abdominal pain nausea and vomiting. ?

## 2021-11-11 ENCOUNTER — Other Ambulatory Visit (HOSPITAL_COMMUNITY): Payer: Self-pay

## 2021-11-11 ENCOUNTER — Other Ambulatory Visit: Payer: Self-pay | Admitting: Family Medicine

## 2021-11-13 ENCOUNTER — Other Ambulatory Visit: Payer: Self-pay

## 2021-11-13 ENCOUNTER — Ambulatory Visit (HOSPITAL_COMMUNITY)
Admission: RE | Admit: 2021-11-13 | Discharge: 2021-11-13 | Disposition: A | Discharge status: Home / Self Care | Payer: 59 | Source: Ambulatory Visit | Attending: Internal Medicine | Admitting: Internal Medicine

## 2021-11-13 ENCOUNTER — Other Ambulatory Visit: Payer: Self-pay | Admitting: Family Medicine

## 2021-11-13 ENCOUNTER — Other Ambulatory Visit (HOSPITAL_COMMUNITY): Payer: Self-pay

## 2021-11-13 DIAGNOSIS — R112 Nausea with vomiting, unspecified: Secondary | ICD-10-CM | POA: Insufficient documentation

## 2021-11-13 DIAGNOSIS — Z9049 Acquired absence of other specified parts of digestive tract: Secondary | ICD-10-CM | POA: Diagnosis not present

## 2021-11-13 MED ORDER — TECHNETIUM TC 99M SULFUR COLLOID: 2.0000 | Freq: Once | INTRAVENOUS | Status: DC | PRN

## 2021-11-13 MED ORDER — TECHNETIUM TC 99M SULFUR COLLOID
2.0000 | Freq: Once | INTRAVENOUS | Status: AC | PRN
  Administered 2021-11-13: 2 via ORAL

## 2021-11-14 ENCOUNTER — Other Ambulatory Visit (HOSPITAL_COMMUNITY): Payer: Self-pay

## 2021-11-15 ENCOUNTER — Other Ambulatory Visit (HOSPITAL_COMMUNITY): Payer: Self-pay

## 2021-11-16 DIAGNOSIS — J9601 Acute respiratory failure with hypoxia: Secondary | ICD-10-CM | POA: Diagnosis not present

## 2021-11-16 DIAGNOSIS — J849 Interstitial pulmonary disease, unspecified: Secondary | ICD-10-CM | POA: Diagnosis not present

## 2021-11-16 DIAGNOSIS — D860 Sarcoidosis of lung: Secondary | ICD-10-CM | POA: Diagnosis not present

## 2021-11-16 DIAGNOSIS — M6281 Muscle weakness (generalized): Secondary | ICD-10-CM | POA: Diagnosis not present

## 2021-11-17 ENCOUNTER — Other Ambulatory Visit (INDEPENDENT_AMBULATORY_CARE_PROVIDER_SITE_OTHER): Payer: Self-pay

## 2021-11-17 DIAGNOSIS — R112 Nausea with vomiting, unspecified: Secondary | ICD-10-CM | POA: Diagnosis not present

## 2021-11-18 ENCOUNTER — Other Ambulatory Visit (HOSPITAL_COMMUNITY): Payer: Self-pay

## 2021-11-18 LAB — CREATININE, SERUM
Creatinine, Ser: 0.88 mg/dL (ref 0.57–1.00)
eGFR: 76 mL/min/{1.73_m2} (ref >59)

## 2021-11-19 DIAGNOSIS — J849 Interstitial pulmonary disease, unspecified: Secondary | ICD-10-CM | POA: Diagnosis not present

## 2021-11-19 DIAGNOSIS — J9601 Acute respiratory failure with hypoxia: Secondary | ICD-10-CM | POA: Diagnosis not present

## 2021-11-19 DIAGNOSIS — D860 Sarcoidosis of lung: Secondary | ICD-10-CM | POA: Diagnosis not present

## 2021-11-19 DIAGNOSIS — M6281 Muscle weakness (generalized): Secondary | ICD-10-CM | POA: Diagnosis not present

## 2021-11-21 ENCOUNTER — Other Ambulatory Visit (HOSPITAL_COMMUNITY): Payer: Self-pay

## 2021-11-22 ENCOUNTER — Other Ambulatory Visit (HOSPITAL_COMMUNITY): Payer: Self-pay

## 2021-11-22 DIAGNOSIS — R0602 Shortness of breath: Secondary | ICD-10-CM | POA: Diagnosis not present

## 2021-11-22 DIAGNOSIS — N6019 Diffuse cystic mastopathy of unspecified breast: Secondary | ICD-10-CM | POA: Diagnosis not present

## 2021-11-22 DIAGNOSIS — Z8616 Personal history of COVID-19: Secondary | ICD-10-CM | POA: Diagnosis not present

## 2021-11-22 DIAGNOSIS — M87152 Osteonecrosis due to drugs, left femur: Secondary | ICD-10-CM | POA: Diagnosis not present

## 2021-11-22 DIAGNOSIS — Z79899 Other long term (current) drug therapy: Secondary | ICD-10-CM | POA: Diagnosis not present

## 2021-11-22 DIAGNOSIS — J849 Interstitial pulmonary disease, unspecified: Secondary | ICD-10-CM | POA: Diagnosis not present

## 2021-11-22 DIAGNOSIS — M87151 Osteonecrosis due to drugs, right femur: Secondary | ICD-10-CM | POA: Diagnosis not present

## 2021-11-22 DIAGNOSIS — D860 Sarcoidosis of lung: Secondary | ICD-10-CM | POA: Diagnosis not present

## 2021-11-22 DIAGNOSIS — I959 Hypotension, unspecified: Secondary | ICD-10-CM | POA: Diagnosis not present

## 2021-11-22 DIAGNOSIS — E2740 Unspecified adrenocortical insufficiency: Secondary | ICD-10-CM | POA: Diagnosis not present

## 2021-11-22 DIAGNOSIS — K219 Gastro-esophageal reflux disease without esophagitis: Secondary | ICD-10-CM | POA: Diagnosis not present

## 2021-11-22 MED ORDER — BUDESONIDE 0.5 MG/2ML IN SUSP
RESPIRATORY_TRACT | 3 refills | Status: DC
  Filled 2021-11-22 – 2021-11-23 (×2): qty 120, 30d supply, fill #0

## 2021-11-22 MED ORDER — FORMOTEROL FUMARATE 20 MCG/2ML IN NEBU
INHALATION_SOLUTION | RESPIRATORY_TRACT | 3 refills | Status: DC
  Filled 2021-11-22: qty 120, 30d supply, fill #0

## 2021-11-23 ENCOUNTER — Other Ambulatory Visit (HOSPITAL_COMMUNITY): Payer: Self-pay

## 2021-11-28 ENCOUNTER — Other Ambulatory Visit (HOSPITAL_COMMUNITY): Payer: Self-pay

## 2021-11-29 ENCOUNTER — Other Ambulatory Visit (HOSPITAL_COMMUNITY): Payer: Self-pay

## 2021-11-29 ENCOUNTER — Other Ambulatory Visit: Payer: Self-pay | Admitting: Family Medicine

## 2021-11-29 MED ORDER — HYDROXYZINE HCL 25 MG PO TABS
ORAL_TABLET | Freq: Three times a day (TID) | ORAL | 5 refills | Status: DC | PRN
  Filled 2021-11-29: qty 90, 30d supply, fill #0
  Filled 2021-12-27: qty 90, 30d supply, fill #1
  Filled 2022-01-26: qty 90, 30d supply, fill #2
  Filled 2022-02-21: qty 90, 30d supply, fill #3
  Filled 2022-03-19: qty 90, 30d supply, fill #4
  Filled 2022-04-16: qty 90, 30d supply, fill #5

## 2021-11-29 MED ORDER — ONDANSETRON 8 MG PO TBDP
ORAL_TABLET | Freq: Three times a day (TID) | ORAL | 5 refills | Status: DC | PRN
  Filled 2021-11-29: qty 15, 5d supply, fill #0
  Filled 2021-12-27: qty 15, 5d supply, fill #1
  Filled 2022-01-05: qty 15, 5d supply, fill #2
  Filled 2022-01-16: qty 15, 5d supply, fill #3
  Filled 2022-02-13: qty 15, 5d supply, fill #4
  Filled 2022-06-18: qty 15, 5d supply, fill #5

## 2021-12-01 ENCOUNTER — Other Ambulatory Visit (HOSPITAL_COMMUNITY): Payer: Self-pay

## 2021-12-11 ENCOUNTER — Other Ambulatory Visit (HOSPITAL_COMMUNITY): Payer: Self-pay

## 2021-12-13 ENCOUNTER — Telehealth (INDEPENDENT_AMBULATORY_CARE_PROVIDER_SITE_OTHER): Payer: Self-pay | Admitting: *Deleted

## 2021-12-13 NOTE — Telephone Encounter (Signed)
Margaree Mackintosh, CMA  ?11/20/2021  8:16 AM EDT   ?  ?Shreshta has called this morning stating when she goes to the restroom she is having rectal bleeding and asking if a Ct scan is what she needs or a colonoscopy, please advise?  ? Margaree Mackintosh, CMA  ?11/17/2021  9:11 AM EDT   ?  ?Errika is aware to go have serum creatine drawn first, the order has been put in   ? Rogene Houston, MD  ?11/16/2021  5:10 PM EDT   ?  ?Study could not be completed. ?Patient says she was served cold exiting toes and she vomited after she finished eating. ?Patient advised to check with Dr. Dorris Fetch if she can take second dose of hydrocortisone earlier. ?In the meantime we will schedule her for abdominal pelvic CT with contrast.  She will need serum creatinine prior to the study.  ? ?Patient called and states she never heard back. Per dr [name] patient should have colonoscopy first and hold off on ct scan for now.  ?

## 2021-12-13 NOTE — Telephone Encounter (Signed)
Called and discussed with pt. She does want to go ahead and proceed with tcs. She states she cannot drink the liquid because she vomits and requesting to see if she can have the pills called in and can do anyday.  ?

## 2021-12-13 NOTE — Telephone Encounter (Signed)
Tried to call no answer

## 2021-12-14 ENCOUNTER — Telehealth (INDEPENDENT_AMBULATORY_CARE_PROVIDER_SITE_OTHER): Payer: Self-pay

## 2021-12-14 ENCOUNTER — Other Ambulatory Visit (INDEPENDENT_AMBULATORY_CARE_PROVIDER_SITE_OTHER): Payer: Self-pay

## 2021-12-14 DIAGNOSIS — K625 Hemorrhage of anus and rectum: Secondary | ICD-10-CM

## 2021-12-14 MED ORDER — SUTAB 1479-225-188 MG PO TABS: 1.0000 | ORAL_TABLET | Freq: Once | ORAL | 0 refills | Status: AC

## 2021-12-14 NOTE — Telephone Encounter (Signed)
Rilan Eiland Ann Whisper Kurka, CMA  ?

## 2021-12-15 ENCOUNTER — Telehealth (INDEPENDENT_AMBULATORY_CARE_PROVIDER_SITE_OTHER): Payer: Self-pay

## 2021-12-15 ENCOUNTER — Encounter (INDEPENDENT_AMBULATORY_CARE_PROVIDER_SITE_OTHER): Payer: Self-pay

## 2021-12-15 MED ORDER — SUTAB 1479-225-188 MG PO TABS
1.0000 | ORAL_TABLET | Freq: Once | ORAL | 0 refills | Status: AC
Start: 2021-12-15 — End: 2021-12-15

## 2021-12-15 NOTE — Telephone Encounter (Signed)
Haig Gerardo Ann Drue Harr, CMA  ?

## 2021-12-17 ENCOUNTER — Other Ambulatory Visit: Payer: Self-pay

## 2021-12-17 ENCOUNTER — Emergency Department (HOSPITAL_BASED_OUTPATIENT_CLINIC_OR_DEPARTMENT_OTHER)
Admission: EM | Admit: 2021-12-17 | Discharge: 2021-12-18 | Disposition: A | Discharge status: Home / Self Care | Payer: 59 | Attending: Emergency Medicine | Admitting: Emergency Medicine

## 2021-12-17 ENCOUNTER — Emergency Department (HOSPITAL_BASED_OUTPATIENT_CLINIC_OR_DEPARTMENT_OTHER): Payer: 59

## 2021-12-17 ENCOUNTER — Encounter (HOSPITAL_BASED_OUTPATIENT_CLINIC_OR_DEPARTMENT_OTHER): Payer: Self-pay

## 2021-12-17 DIAGNOSIS — J449 Chronic obstructive pulmonary disease, unspecified: Secondary | ICD-10-CM | POA: Diagnosis not present

## 2021-12-17 DIAGNOSIS — J849 Interstitial pulmonary disease, unspecified: Secondary | ICD-10-CM | POA: Diagnosis not present

## 2021-12-17 DIAGNOSIS — R000 Tachycardia, unspecified: Secondary | ICD-10-CM | POA: Diagnosis not present

## 2021-12-17 DIAGNOSIS — D860 Sarcoidosis of lung: Secondary | ICD-10-CM | POA: Diagnosis not present

## 2021-12-17 DIAGNOSIS — J9601 Acute respiratory failure with hypoxia: Secondary | ICD-10-CM | POA: Diagnosis not present

## 2021-12-17 DIAGNOSIS — Z20822 Contact with and (suspected) exposure to covid-19: Secondary | ICD-10-CM | POA: Insufficient documentation

## 2021-12-17 DIAGNOSIS — E039 Hypothyroidism, unspecified: Secondary | ICD-10-CM | POA: Diagnosis not present

## 2021-12-17 DIAGNOSIS — B349 Viral infection, unspecified: Secondary | ICD-10-CM | POA: Insufficient documentation

## 2021-12-17 DIAGNOSIS — J029 Acute pharyngitis, unspecified: Secondary | ICD-10-CM | POA: Diagnosis not present

## 2021-12-17 DIAGNOSIS — M6281 Muscle weakness (generalized): Secondary | ICD-10-CM | POA: Diagnosis not present

## 2021-12-17 DIAGNOSIS — R0789 Other chest pain: Secondary | ICD-10-CM | POA: Diagnosis not present

## 2021-12-17 DIAGNOSIS — Z79899 Other long term (current) drug therapy: Secondary | ICD-10-CM | POA: Diagnosis not present

## 2021-12-17 LAB — CBC
HCT: 41.1 % (ref 36.0–46.0)
Hemoglobin: 13.1 g/dL (ref 12.0–15.0)
MCH: 28.2 pg (ref 26.0–34.0)
MCHC: 31.9 g/dL (ref 30.0–36.0)
MCV: 88.6 fL (ref 80.0–100.0)
Platelets: 396 10*3/uL (ref 150–400)
RBC: 4.64 MIL/uL (ref 3.87–5.11)
RDW: 14.3 % (ref 11.5–15.5)
WBC: 13 10*3/uL — ABNORMAL HIGH (ref 4.0–10.5)
nRBC: 0 % (ref 0.0–0.2)

## 2021-12-17 LAB — COMPREHENSIVE METABOLIC PANEL WITH GFR
ALT: 12 U/L (ref 0–44)
AST: 12 U/L — ABNORMAL LOW (ref 15–41)
Albumin: 4.3 g/dL (ref 3.5–5.0)
Alkaline Phosphatase: 60 U/L (ref 38–126)
Anion gap: 10 (ref 5–15)
BUN: 9 mg/dL (ref 6–20)
CO2: 28 mmol/L (ref 22–32)
Calcium: 9.9 mg/dL (ref 8.9–10.3)
Chloride: 97 mmol/L — ABNORMAL LOW (ref 98–111)
Creatinine, Ser: 1.02 mg/dL — ABNORMAL HIGH (ref 0.44–1.00)
GFR, Estimated: >60 mL/min (ref >60)
Glucose, Bld: 87 mg/dL (ref 70–99)
Potassium: 4.2 mmol/L (ref 3.5–5.1)
Sodium: 135 mmol/L (ref 135–145)
Total Bilirubin: 0.6 mg/dL (ref 0.3–1.2)
Total Protein: 7.3 g/dL (ref 6.5–8.1)

## 2021-12-17 LAB — LIPASE, BLOOD: Lipase: 18 U/L (ref 11–51)

## 2021-12-17 LAB — TROPONIN I (HIGH SENSITIVITY): Troponin I (High Sensitivity): 3 ng/L (ref <18)

## 2021-12-17 MED ORDER — ONDANSETRON 4 MG PO TBDP
4.0000 mg | ORAL_TABLET | Freq: Once | ORAL | Status: AC | PRN
Start: 2021-12-17 — End: 2021-12-17
  Administered 2021-12-17: 4 mg via ORAL
  Filled 2021-12-17: qty 1

## 2021-12-17 MED ORDER — SODIUM CHLORIDE 0.9 % IV BOLUS
1000.0000 mL | Freq: Once | INTRAVENOUS | Status: AC
  Administered 2021-12-18: 1000 mL via INTRAVENOUS

## 2021-12-17 NOTE — ED Triage Notes (Signed)
Pt presents with sore throat and body aches for the last couple of days. Today pt started having vomiting followed by chest pain. Pt also has some ShOB, but pt states that is at her baseline.  ?

## 2021-12-18 ENCOUNTER — Other Ambulatory Visit (HOSPITAL_COMMUNITY): Payer: Self-pay

## 2021-12-18 DIAGNOSIS — Z20822 Contact with and (suspected) exposure to covid-19: Secondary | ICD-10-CM | POA: Diagnosis not present

## 2021-12-18 DIAGNOSIS — B349 Viral infection, unspecified: Secondary | ICD-10-CM | POA: Diagnosis not present

## 2021-12-18 DIAGNOSIS — E039 Hypothyroidism, unspecified: Secondary | ICD-10-CM | POA: Diagnosis not present

## 2021-12-18 DIAGNOSIS — J449 Chronic obstructive pulmonary disease, unspecified: Secondary | ICD-10-CM | POA: Diagnosis not present

## 2021-12-18 DIAGNOSIS — Z79899 Other long term (current) drug therapy: Secondary | ICD-10-CM | POA: Diagnosis not present

## 2021-12-18 LAB — RESP PANEL BY RT-PCR (FLU A&B, COVID) ARPGX2
Influenza A by PCR: NEGATIVE
Influenza B by PCR: NEGATIVE
SARS Coronavirus 2 by RT PCR: NEGATIVE

## 2021-12-18 LAB — URINALYSIS, ROUTINE W REFLEX MICROSCOPIC
Bilirubin Urine: NEGATIVE
Glucose, UA: NEGATIVE mg/dL
Hgb urine dipstick: NEGATIVE
Ketones, ur: NEGATIVE mg/dL
Leukocytes,Ua: NEGATIVE
Nitrite: NEGATIVE
Protein, ur: NEGATIVE mg/dL
Specific Gravity, Urine: 1.015 (ref 1.005–1.030)
pH: 8.5 — ABNORMAL HIGH (ref 5.0–8.0)

## 2021-12-18 LAB — TROPONIN I (HIGH SENSITIVITY): Troponin I (High Sensitivity): 3 ng/L (ref <18)

## 2021-12-18 LAB — GROUP A STREP BY PCR: Group A Strep by PCR: NOT DETECTED

## 2021-12-18 MED ORDER — PROCHLORPERAZINE EDISYLATE 10 MG/2ML IJ SOLN
10.0000 mg | Freq: Once | INTRAMUSCULAR | Status: AC
  Administered 2021-12-18: 10 mg via INTRAVENOUS
  Filled 2021-12-18: qty 2

## 2021-12-18 NOTE — ED Notes (Signed)
Unable to provide urine specimen.  

## 2021-12-18 NOTE — ED Provider Notes (Signed)
?[facility] EMERGENCY DEPT ?Provider Note ? ? ?CSN: [id number] ?Arrival date & time: 12/17/21  2146 ? ?  ? ?History ? ?No chief complaint on file. ? ? ?Kathleen [name] is a 61 y.o. female. ? ?HPI ? ?  ? ?This is a 61 year old female with a history of fibromyalgia, restrictive lung disease, COPD who presents with multiple complaints.  Patient reports several day history of sore throat and body aches.  No documented fevers but has had chills.  She states that she has had multiple episodes of nonbilious, nonbloody emesis chest discomfort.  She has had shortness of breath without cough.  No known sick contacts COVID or flu exposures.  She has taken her Zofran at home with minimal relief.  Denies that the chest pain is exertional.  Worse with coughing. ? ?Home Medications ?Prior to Admission medications   ?Medication Sig Start Date End Date Taking? Authorizing Provider  ?acetaminophen (TYLENOL) 500 MG tablet Take 1,000 mg by mouth every 8 (eight) hours as needed for headache.    [provider]  ?albuterol (VENTOLIN HFA) 108 (90 Base) MCG/ACT inhaler Inhale 2 puffs into the lungs every 6 (six) hours as needed for wheezing or shortness of breath. 05/22/21   Kathleen Hockey, MD  ?ascorbic acid (VITAMIN C) 500 MG tablet Take 1 tablet (500 mg total) by mouth daily. 05/23/21   Kathleen Hockey, MD  ?bisacodyl (DULCOLAX) 10 MG suppository Place 1 suppository (10 mg total) rectally daily. 11/07/21   [name], Kathleen Dawley, MD  ?budesonide (PULMICORT) 0.5 MG/2ML nebulizer solution Inhale 2 mL (0.5 mg total) by nebulization two (2) times a day as needed. 06/08/21     ?budesonide (PULMICORT) 0.5 MG/2ML nebulizer solution Inhale the contents of 1 vial via nebulizer two (2) times a day. 11/22/21     ?doxycycline (VIBRA-TABS) 100 MG tablet Take 1 tablet (100 mg total) by mouth 2 (two) times daily. 10/31/21   [name], MD  ?EPINEPHrine 0.3 mg/0.3 mL IJ SOAJ injection Inject 0.3 mLs (0.3 mg total) into the muscle as  needed for anaphylaxis. 02/26/20   [name], MD  ?ferrous sulfate 325 (65 FE) MG EC tablet Take 325 mg by mouth daily with breakfast.    [provider]  ?FLUoxetine (PROZAC) 20 MG capsule Take 3 capsules by mouth once a day. 06/14/21   [name], MD  ?formoterol (PERFOROMIST) 20 MCG/2ML nebulizer solution Inhale 1 vial via nebulizer two times a day. 06/21/21     ?formoterol (PERFOROMIST) 20 MCG/2ML nebulizer solution Inhale the contents of 1 vial via nebulizer two (2) times a day. 11/22/21     ?hydrocortisone (CORTEF) 10 MG tablet Take 2 tablets by mouth at 8:00 am and 1 tablet at lunch. (May double dose on sick days x 3-5 days) 10/06/21   Kathleen Anger, MD  ?hydrOXYzine (ATARAX) 25 MG tablet TAKE 1 TABLET BY MOUTH 3 TIMES DAILY AS NEEDED FOR ANXIETY 11/29/21 11/29/22  [name], MD  ?levothyroxine (SYNTHROID) 50 MCG tablet Take 1 tablet (50 mcg total) by mouth daily before breakfast. 09/18/21 03/19/22  Kathleen Anger, MD  ?linaclotide Kathleen [name]) 290 MCG CAPS capsule Take 1 capsule (290 mcg total) by mouth daily before breakfast. 02/21/21   [name], Kathleen Dawley, MD  ?midodrine (PROAMATINE) 5 MG tablet TAKE 1 TABLET BY MOUTH TWICE DAILY WITH A MEAL 10/13/21 10/13/22  [name], MD  ?ondansetron (ZOFRAN-ODT) 4 MG disintegrating tablet Take 1 tablet (4 mg total) by mouth every 8 (  eight) hours as needed for nausea or vomiting. 09/20/21   Kathleen Borys, Barbette Hair, MD  ?ondansetron (ZOFRAN-ODT) 8 MG disintegrating tablet DISSOLVE 1 TABLET BY MOUTH EVERY 8 HOURS AS NEEDED FOR NAUSEA & VOMITING 11/29/21 11/29/22  [name], MD  ?Oxycodone HCl 10 MG TABS Take 1 tablet by mouth every 4 hours as needed for pain (max 6 tablets daily)*10/20/21 10/13/21   [name], MD  ?Oxycodone HCl 10 MG TABS Take 1 tablet by mouth every 4 hours as needed for pain (max 6 tablets daily). *11/18/21 10/13/21   [name], MD  ?Oxycodone HCl 10 MG TABS Take 1 tablet by mouth every 4 hours as needed for pain (max  6 tablets daily). *12/18/21 10/13/21   [name], MD  ?pantoprazole (PROTONIX) 40 MG tablet Take 1 tablet by mouth 2 times daily before a meal. 11/07/21 11/07/22  [name], Kathleen Dawley, MD  ?predniSONE (DELTASONE) 5 MG tablet Take 1 tablet (5 mg total) by mouth in the morning. 06/21/21     ?   ? ?Allergies    ?Bee venom, Contrast media [iodinated contrast media], Bactrim [sulfamethoxazole-trimethoprim], Codeine, and Morphine and related   ? ?Review of Systems   ?Review of Systems  ?Constitutional:  Positive for chills. Negative for fever.  ?HENT:  Positive for congestion and sore throat.   ?Respiratory:  Positive for cough and shortness of breath.   ?Cardiovascular:  Positive for chest pain.  ?Gastrointestinal:  Positive for nausea and vomiting. Negative for abdominal pain, constipation and diarrhea.  ?Genitourinary:  Negative for dysuria.  ?All other systems reviewed and are negative. ? ?Physical Exam ?Updated Vital Signs ?BP (!) 114/58   Pulse 85   Temp 98.5 ?F (36.9 ?C)   Resp 20   Ht 1.778 m ('5\' 10"'$ )   Wt 99.8 kg   LMP 05/17/2016   SpO2 98%   BMI 31.57 kg/m?  ?Physical Exam ?Vitals and nursing note reviewed.  ?Constitutional:   ?   Appearance: She is well-developed. She is obese. She is not ill-appearing.  ?HENT:  ?   Head: Normocephalic and atraumatic.  ?   Mouth/Throat:  ?   Mouth: Mucous membranes are moist.  ?   Comments: Posterior oropharyngeal erythema, no tonsillar exudate or swelling noted, uvula midline ?Eyes:  ?   Pupils: Pupils are equal, round, and reactive to light.  ?Cardiovascular:  ?   Rate and Rhythm: Normal rate and regular rhythm.  ?   Heart sounds: Normal heart sounds.  ?Pulmonary:  ?   Effort: Pulmonary effort is normal. No respiratory distress.  ?   Breath sounds: Normal breath sounds. No wheezing.  ?Abdominal:  ?   General: Bowel sounds are normal.  ?   Palpations: Abdomen is soft.  ?   Tenderness: There is no abdominal tenderness.  ?Musculoskeletal:  ?   Cervical back: Neck supple.   ?   Right lower leg: No edema.  ?   Left lower leg: No edema.  ?Skin: ?   General: Skin is warm and dry.  ?Neurological:  ?   Mental Status: She is alert and oriented to person, place, and time.  ?Psychiatric:     ?   Mood and Affect: Mood normal.  ? ? ?ED Results / Procedures / Treatments   ?Labs ?(all labs ordered are listed, but only abnormal results are displayed) ?Labs Reviewed  ?COMPREHENSIVE METABOLIC PANEL - Abnormal; Notable for the following components:  ?    Result Value  ?  Chloride 97 (*)   ? Creatinine, Ser 1.02 (*)   ? AST 12 (*)   ? All other components within normal limits  ?CBC - Abnormal; Notable for the following components:  ? WBC 13.0 (*)   ? All other components within normal limits  ?URINALYSIS, ROUTINE W REFLEX MICROSCOPIC - Abnormal; Notable for the following components:  ? pH 8.5 (*)   ? All other components within normal limits  ?GROUP A STREP BY PCR  ?RESP PANEL BY RT-PCR (FLU A&B, COVID) [id number]  ?LIPASE, BLOOD  ?TROPONIN I (HIGH SENSITIVITY)  ?TROPONIN I (HIGH SENSITIVITY)  ? ? ?EKG ?EKG Interpretation ? ?Date/Time:  Sunday December 17 2021 21:54:44 EDT ?Ventricular Rate:  101 ?PR Interval:  120 ?QRS Duration: 70 ?QT Interval:  314 ?QTC Calculation: 407 ?R Axis:   52 ?Text Interpretation: Sinus tachycardia ST & T wave abnormality, consider inferior ischemia Abnormal ECG When compared with ECG of 30-Mar-2021 17:22, PREVIOUS ECG IS PRESENT Confirmed by Thayer Jew [phone]) on 12/18/2021 2:01:50 AM ? ?Radiology ?DG Chest Port 1 View ? ?Result Date: 12/17/2021 ?CLINICAL DATA:  Sore throat, body aches EXAM: PORTABLE CHEST 1 VIEW COMPARISON:  09/20/2021 FINDINGS: Heart is normal size. Chronic increased markings in the lung bases, likely scarring. No acute confluent opacities or effusions. No acute bony abnormality. IMPRESSION: Stable chronic changes.  No active disease. Electronically Signed   By: Rolm Baptise M.D.   On: 12/17/2021 23:24   ? ?Procedures ?Procedures  ? ? ?Medications Ordered in  ED ?Medications  ?ondansetron (ZOFRAN-ODT) disintegrating tablet 4 mg (4 mg Oral Given 12/17/21 2202)  ?sodium chloride 0.9 % bolus 1,000 mL (0 mLs Intravenous Stopped 12/18/21 0144)  ?prochlorperazine (COMPAZI

## 2021-12-18 NOTE — Discharge Instructions (Signed)
You were seen today for multiple complaints.  This is likely related to a virus.  Your COVID and influenza testing are negative.  Your strep testing is also negative.  Make sure that you are staying hydrated.  Take your Zofran at home as needed. ?

## 2021-12-20 DIAGNOSIS — M6281 Muscle weakness (generalized): Secondary | ICD-10-CM | POA: Diagnosis not present

## 2021-12-20 DIAGNOSIS — J849 Interstitial pulmonary disease, unspecified: Secondary | ICD-10-CM | POA: Diagnosis not present

## 2021-12-20 DIAGNOSIS — J9601 Acute respiratory failure with hypoxia: Secondary | ICD-10-CM | POA: Diagnosis not present

## 2021-12-20 DIAGNOSIS — D860 Sarcoidosis of lung: Secondary | ICD-10-CM | POA: Diagnosis not present

## 2021-12-27 ENCOUNTER — Other Ambulatory Visit (HOSPITAL_COMMUNITY): Payer: Self-pay

## 2022-01-01 ENCOUNTER — Encounter (HOSPITAL_COMMUNITY)
Admission: RE | Admit: 2022-01-01 | Discharge: 2022-01-01 | Disposition: A | Discharge status: Home / Self Care | Payer: 59 | Source: Ambulatory Visit | Attending: Internal Medicine | Admitting: Internal Medicine

## 2022-01-03 ENCOUNTER — Ambulatory Visit (HOSPITAL_COMMUNITY): Payer: 59 | Admitting: Anesthesiology

## 2022-01-03 ENCOUNTER — Ambulatory Visit (HOSPITAL_BASED_OUTPATIENT_CLINIC_OR_DEPARTMENT_OTHER): Payer: 59 | Admitting: Anesthesiology

## 2022-01-03 ENCOUNTER — Encounter (HOSPITAL_COMMUNITY): Payer: Self-pay | Admitting: Internal Medicine

## 2022-01-03 ENCOUNTER — Ambulatory Visit (HOSPITAL_COMMUNITY)
Admission: RE | Admit: 2022-01-03 | Discharge: 2022-01-03 | Disposition: A | Discharge status: Home / Self Care | Payer: 59 | Attending: Internal Medicine | Admitting: Internal Medicine

## 2022-01-03 ENCOUNTER — Encounter (HOSPITAL_COMMUNITY)
Admission: RE | Disposition: A | Discharge status: Home / Self Care | Payer: Self-pay | Source: Home / Self Care | Attending: Internal Medicine

## 2022-01-03 DIAGNOSIS — J449 Chronic obstructive pulmonary disease, unspecified: Secondary | ICD-10-CM

## 2022-01-03 DIAGNOSIS — K219 Gastro-esophageal reflux disease without esophagitis: Secondary | ICD-10-CM | POA: Insufficient documentation

## 2022-01-03 DIAGNOSIS — F419 Anxiety disorder, unspecified: Secondary | ICD-10-CM | POA: Insufficient documentation

## 2022-01-03 DIAGNOSIS — F32A Depression, unspecified: Secondary | ICD-10-CM | POA: Diagnosis not present

## 2022-01-03 DIAGNOSIS — E039 Hypothyroidism, unspecified: Secondary | ICD-10-CM | POA: Diagnosis not present

## 2022-01-03 DIAGNOSIS — K625 Hemorrhage of anus and rectum: Secondary | ICD-10-CM | POA: Diagnosis not present

## 2022-01-03 DIAGNOSIS — K635 Polyp of colon: Secondary | ICD-10-CM | POA: Diagnosis not present

## 2022-01-03 DIAGNOSIS — G4730 Sleep apnea, unspecified: Secondary | ICD-10-CM

## 2022-01-03 DIAGNOSIS — K644 Residual hemorrhoidal skin tags: Secondary | ICD-10-CM | POA: Insufficient documentation

## 2022-01-03 DIAGNOSIS — D122 Benign neoplasm of ascending colon: Secondary | ICD-10-CM | POA: Diagnosis not present

## 2022-01-03 HISTORY — PX: POLYPECTOMY: SHX149

## 2022-01-03 HISTORY — PX: COLONOSCOPY WITH PROPOFOL: SHX5780

## 2022-01-03 SURGERY — COLONOSCOPY WITH PROPOFOL
Anesthesia: General

## 2022-01-03 MED ORDER — SODIUM CHLORIDE FLUSH 0.9 % IV SOLN
INTRAVENOUS | Status: AC
  Filled 2022-01-03: qty 10

## 2022-01-03 MED ORDER — ALBUTEROL SULFATE (2.5 MG/3ML) 0.083% IN NEBU
2.5000 mg | INHALATION_SOLUTION | Freq: Once | RESPIRATORY_TRACT | Status: AC
  Administered 2022-01-03: 2.5 mg via RESPIRATORY_TRACT

## 2022-01-03 MED ORDER — LACTATED RINGERS IV SOLN
INTRAVENOUS | Status: DC
  Administered 2022-01-03: 1000 mL via INTRAVENOUS

## 2022-01-03 MED ORDER — LIDOCAINE HCL (CARDIAC) PF 100 MG/5ML IV SOSY
PREFILLED_SYRINGE | INTRAVENOUS | Status: DC | PRN
  Administered 2022-01-03: 60 mg via INTRAVENOUS

## 2022-01-03 MED ORDER — PROPOFOL 500 MG/50ML IV EMUL
INTRAVENOUS | Status: DC | PRN
  Administered 2022-01-03: 200 ug/kg/min via INTRAVENOUS

## 2022-01-03 MED ORDER — PROPOFOL 10 MG/ML IV BOLUS
INTRAVENOUS | Status: AC
  Filled 2022-01-03: qty 20

## 2022-01-03 MED ORDER — ONDANSETRON HCL 4 MG/2ML IJ SOLN: 4.0000 mg | Freq: Once | INTRAMUSCULAR | Status: DC

## 2022-01-03 MED ORDER — ALBUTEROL SULFATE (2.5 MG/3ML) 0.083% IN NEBU
INHALATION_SOLUTION | RESPIRATORY_TRACT | Status: AC
Start: 2022-01-03 — End: ?
  Filled 2022-01-03: qty 3

## 2022-01-03 MED ORDER — DEXAMETHASONE SODIUM PHOSPHATE 4 MG/ML IJ SOLN
INTRAMUSCULAR | Status: DC | PRN
  Administered 2022-01-03: 8 mg via INTRAVENOUS

## 2022-01-03 MED ORDER — ONDANSETRON HCL 4 MG/2ML IJ SOLN
INTRAMUSCULAR | Status: DC | PRN
  Administered 2022-01-03: 8 mg via INTRAVENOUS

## 2022-01-03 MED ORDER — ONDANSETRON HCL 4 MG/2ML IJ SOLN
INTRAMUSCULAR | Status: AC
  Filled 2022-01-03: qty 4

## 2022-01-03 MED ORDER — PROPOFOL 500 MG/50ML IV EMUL
INTRAVENOUS | Status: AC
  Filled 2022-01-03: qty 150

## 2022-01-03 MED ORDER — PROPOFOL 10 MG/ML IV BOLUS
INTRAVENOUS | Status: DC | PRN
  Administered 2022-01-03: 100 mg via INTRAVENOUS

## 2022-01-03 MED ORDER — SODIUM CHLORIDE 0.9% FLUSH
10.0000 mL | Freq: Once | INTRAVENOUS | Status: AC
  Administered 2022-01-03: 10 mL via INTRAVENOUS

## 2022-01-03 MED ORDER — ONDANSETRON HCL 4 MG/2ML IJ SOLN
INTRAMUSCULAR | Status: AC
  Filled 2022-01-03: qty 2

## 2022-01-03 MED ORDER — LIDOCAINE HCL (PF) 2 % IJ SOLN
INTRAMUSCULAR | Status: AC
  Filled 2022-01-03: qty 5

## 2022-01-03 MED ORDER — DEXAMETHASONE SODIUM PHOSPHATE 4 MG/ML IJ SOLN
INTRAMUSCULAR | Status: AC
  Filled 2022-01-03: qty 2

## 2022-01-03 MED ORDER — SUCRALFATE 1 G PO TABS: 1.0000 g | ORAL_TABLET | Freq: Three times a day (TID) | ORAL | 1 refills | Status: DC

## 2022-01-03 MED ORDER — ONDANSETRON HCL 4 MG/2ML IJ SOLN
4.0000 mg | Freq: Once | INTRAMUSCULAR | Status: AC
  Administered 2022-01-03: 4 mg via INTRAVENOUS

## 2022-01-03 NOTE — Op Note (Signed)
[facility] ?Patient Name: Kathleen [name] ?Procedure Date: 01/03/2022 12:42 PM ?MRN: [id number] ?Date of Birth: 1961-07-18 ?Attending MD: Hildred Laser , MD ?CSN: [id number] ?Age: 61 ?Admit Type: Outpatient ?Procedure:                Colonoscopy ?Indications:              Rectal bleeding ?Providers:                Hildred Laser, MD, [place] Page, Raphael Gibney,  ?                          Technician ?Referring MD:             Sallee Lange, MD ?Medicines:                Propofol per Anesthesia ?Complications:            No immediate complications. ?Estimated Blood Loss:     Estimated blood loss was minimal. ?Procedure:                Pre-Anesthesia Assessment: ?                          - Prior to the procedure, a History and Physical  ?                          was performed, and patient medications and  ?                          allergies were reviewed. The patient's tolerance of  ?                          previous anesthesia was also reviewed. The risks  ?                          and benefits of the procedure and the sedation  ?                          options and risks were discussed with the patient.  ?                          All questions were answered, and informed consent  ?                          was obtained. Prior Anticoagulants: The patient has  ?                          taken no previous anticoagulant or antiplatelet  ?                          agents. ASA Grade Assessment: III - A patient with  ?                          severe systemic disease. After reviewing the risks  ?  and benefits, the patient was deemed in  ?                          satisfactory condition to undergo the procedure. ?                          After obtaining informed consent, the colonoscope  ?                          was passed under direct vision. Throughout the  ?                          procedure, the patient's blood pressure, pulse, and  ?                          oxygen saturations were  monitored continuously. The  ?                          PCF-HQ190L ([id number]) scope was introduced through  ?                          the anus and advanced to the the cecum, identified  ?                          by appendiceal orifice and ileocecal valve. The  ?                          colonoscopy was technically difficult and complex  ?                          due to a redundant colon. The patient tolerated the  ?                          procedure well. The quality of the bowel  ?                          preparation was adequate to identify polyps. The  ?                          ileocecal valve, appendiceal orifice, and rectum  ?                          were photographed. ?Scope In: 1:18:51 PM ?Scope Out: 1:42:54 PM ?Scope Withdrawal Time: 0 hours 4 minutes 58 seconds  ?Total Procedure Duration: 0 hours 24 minutes 3 seconds  ?Findings: ?     The perianal and digital rectal examinations were normal. ?     A small polyp was found in the proximal ascending colon. Biopsies were  ?     taken with a cold forceps for histology. ?     The exam was otherwise normal throughout the examined colon. ?     External hemorrhoids were found during retroflexion. The hemorrhoids  ?     were small. ?Impression:               - One  small polyp in the proximal ascending colon.  ?                          Biopsied. ?                          - External hemorrhoids. ?Moderate Sedation: ?     Per Anesthesia Care ?Recommendation:           - Patient has a contact number available for  ?                          emergencies. The signs and symptoms of potential  ?                          delayed complications were discussed with the  ?                          patient. Return to normal activities tomorrow.  ?                          Written discharge instructions were provided to the  ?                          patient. ?                          - Resume previous diet today. ?                          - Continue present  medications. ?                          - Await pathology results. ?                          - Repeat colonoscopy is recommended. The  ?                          colonoscopy date will be determined after pathology  ?                          results from today's exam become available for  ?                          review. ?Procedure Code(s):        --- Professional --- ?                          [phone], Colonoscopy, flexible; with biopsy, single  ?                          or multiple ?Diagnosis Code(s):        --- Professional --- ?                          K63.5, Polyp of colon ?  K64.4, Residual hemorrhoidal skin tags ?                          K62.5, Hemorrhage of anus and rectum ?CPT copyright 2019 American Medical Association. All rights reserved. ?The codes documented in this report are preliminary and upon coder review may  ?be revised to meet current compliance requirements. ?Hildred Laser, MD ?Hildred Laser, MD ?01/03/2022 1:50:58 PM ?This report has been signed electronically. ?Number of Addenda: 0 ?

## 2022-01-03 NOTE — Discharge Instructions (Signed)
No aspirin or NSAIDs for 24 hours ?Resume other medications as before. ?Resume usual diet. ?No driving for 24 hours. ?Physician will call with biopsy results. ?

## 2022-01-03 NOTE — H&P (Signed)
Kathleen [name] is an 61 y.o. female.   ?Chief Complaint: Patient is here for colonoscopy ?HPI: Patient is a 61 year old Caucasian female with multiple medical problems including adrenal cortical insufficiency who has chronic abdominal pain as well as nausea and vomiting.  She has undergone extensive work-up.  She called her office about 3 weeks ago with complaints of rectal bleeding.  She passed large amount of fresh blood with a bowel movement.  She states last episode was 2 weeks ago.  She remains with chronic abdominal pain and she has irregular bowel movements.  She can be constipated and not have a BM for few days and then she will have normal stools and she could also have diarrhea. ?Family history is negative for colon cancer.  Patient's last colonoscopy was in August 2014 revealing internal and external hemorrhoids otherwise normal examination. ? ?H&H on 12/17/2021 was 13.1 and 41.1. ? ?Past Medical History:  ?Diagnosis Date  ? Adrenal insufficiency ([place])   ? Anemia   ? Anxiety   ? Avascular bone necrosis ([place])   ? Breast fibrocystic disorder   ? Chronic pain   ? COPD (chronic obstructive pulmonary disease) ([place])   ? Encounter for long-term opiate analgesic use 02/02/2020  ? Patient has chronic pain with femoral head avascular necrosis.  Is under pain contract  ? Fibromyalgia   ? Gastroesophageal reflux disease   ? Hypothyroidism   ? Mitral valve prolapse   ? Orthostatic hypotension   ? Peripheral neuropathy   ? PONV (postoperative nausea and vomiting)   ? Restrictive lung disease   ? Moderate to severe  ? Sarcoidosis   ? Biopsy proven - [facility]  ? SOB (shortness of breath)   ? chronic  ? ? ?Past Surgical History:  ?Procedure Laterality Date  ? BIOPSY  10/17/2018  ? Procedure: BIOPSY;  Surgeon: Rogene Houston, MD;  Location: [facility];  Service: Endoscopy;;  duodenum, antrum, gastric body  ? BIOPSY  03/03/2021  ? Procedure: BIOPSY;  Surgeon: Rogene Houston, MD;  Location: [facility];  Service:  Endoscopy;;  ? BREAST LUMPECTOMY Bilateral 01/12/2011  ? right  ? CHOLECYSTECTOMY N/A 04/17/2021  ? Procedure: LAPAROSCOPIC CHOLECYSTECTOMY;  Surgeon: Aviva Signs, MD;  Location: [facility] ORS;  Service: General;  Laterality: N/A;  ? ESOPHAGEAL DILATION N/A 03/03/2021  ? Procedure: ESOPHAGEAL DILATION;  Surgeon: Rogene Houston, MD;  Location: [facility];  Service: Endoscopy;  Laterality: N/A;  ? ESOPHAGOGASTRODUODENOSCOPY (EGD) WITH PROPOFOL N/A 10/17/2018  ? Procedure: ESOPHAGOGASTRODUODENOSCOPY (EGD) WITH PROPOFOL;  Surgeon: Rogene Houston, MD;  [facility];  Service: Endoscopy;  Laterality: N/A;  2:25  ? ESOPHAGOGASTRODUODENOSCOPY (EGD) WITH PROPOFOL N/A 03/03/2021  ? Procedure: ESOPHAGOGASTRODUODENOSCOPY (EGD) WITH PROPOFOL;  Surgeon: Rogene Houston, MD;  [facility]: [facility];  Service: Endoscopy;  Laterality: N/A;  12:15  ? TUBAL LIGATION    ? ? ?Family History  ?Problem Relation Age of Onset  ? Cardiomyopathy Mother   ? Breast cancer Mother   ? Prostate cancer Father   ? Neurofibromatosis Cousin   ? Heart disease Maternal Aunt   ? Bipolar disorder Daughter   ? Colon cancer Neg Hx   ? ?Social History:  reports that she has never smoked. She has never been exposed to tobacco smoke. She has never used smokeless tobacco. She reports that she does not drink alcohol and does not use drugs. ? ?Allergies:  ?Allergies  ?Allergen Reactions  ? Bee Venom Anaphylaxis  ? Contrast  Media [Iodinated Contrast Media]   ?  CT chest w/ contrast at OSH followed by SOB resolved w/ single dose steroids, never ENT swelling, intubation or pressors, has had multiple contrasted scans since  ? Lactose Intolerance (Gi)   ?  GI Upset  ? Bactrim [Sulfamethoxazole-Trimethoprim] Nausea And Vomiting  ? Codeine Itching  ? Morphine And Related Other (See Comments)  ?  Unknown reaction- pt gets sick, dizzy, and confused ?Intolerance, not an allergy, received this in the emergency department recently without a problem.   ? ? ?Medications Prior to Admission  ?Medication Sig Dispense Refill  ? acetaminophen (TYLENOL) 500 MG tablet Take 1,000 mg by mouth every 8 (eight) hours as needed for headache.    ? albuterol (VENTOLIN HFA) 108 (90 Base) MCG/ACT inhaler Inhale 2 puffs into the lungs every 6 (six) hours as needed for wheezing or shortness of breath. 18 g 1  ? ascorbic acid (VITAMIN C) 500 MG tablet Take 1 tablet (500 mg total) by mouth daily. 30 tablet 0  ? budesonide (PULMICORT) 0.5 MG/2ML nebulizer solution Inhale the contents of 1 vial via nebulizer two (2) times a day. 360 mL 3  ? EPINEPHrine 0.3 mg/0.3 mL IJ SOAJ injection Inject 0.3 mLs (0.3 mg total) into the muscle as needed for anaphylaxis. 1 each 2  ? FLUoxetine (PROZAC) 20 MG capsule Take 3 capsules by mouth once a day. 270 capsule 3  ? formoterol (PERFOROMIST) 20 MCG/2ML nebulizer solution Inhale the contents of 1 vial via nebulizer two (2) times a day. 360 mL 3  ? hydrocortisone (CORTEF) 10 MG tablet Take 2 tablets by mouth at 8:00 am and 1 tablet at lunch. (May double dose on sick days x 3-5 days) 120 tablet 3  ? hydrOXYzine (ATARAX) 25 MG tablet TAKE 1 TABLET BY MOUTH 3 TIMES DAILY AS NEEDED FOR ANXIETY 90 tablet 5  ? levothyroxine (SYNTHROID) 50 MCG tablet Take 1 tablet (50 mcg total) by mouth daily before breakfast. 90 tablet 1  ? linaclotide (LINZESS) 290 MCG CAPS capsule Take 1 capsule (290 mcg total) by mouth daily before breakfast. 30 capsule 5  ? midodrine (PROAMATINE) 5 MG tablet TAKE 1 TABLET BY MOUTH TWICE DAILY WITH A MEAL 60 tablet 5  ? ondansetron (ZOFRAN-ODT) 8 MG disintegrating tablet DISSOLVE 1 TABLET BY MOUTH EVERY 8 HOURS AS NEEDED FOR NAUSEA & VOMITING 15 tablet 5  ? Oxycodone HCl 10 MG TABS Take 1 tablet by mouth every 4 hours as needed for pain (max 6 tablets daily). *12/18/21 180 tablet 0  ? pantoprazole (PROTONIX) 40 MG tablet Take 1 tablet by mouth 2 times daily before a meal. 60 tablet 5  ? predniSONE (DELTASONE) 5 MG tablet Take 1 tablet (5  mg total) by mouth in the morning. 30 tablet 11  ? bisacodyl (DULCOLAX) 10 MG suppository Place 1 suppository (10 mg total) rectally daily. (Patient not taking: Reported on 12/27/2021) 14 suppository 0  ? budesonide (PULMICORT) 0.5 MG/2ML nebulizer solution Inhale 2 mL (0.5 mg total) by nebulization two (2) times a day as needed. (Patient not taking: Reported on 12/27/2021) 75 mL 6  ? doxycycline (VIBRA-TABS) 100 MG tablet Take 1 tablet (100 mg total) by mouth 2 (two) times daily. (Patient not taking: Reported on 12/27/2021) 20 tablet 0  ? ferrous sulfate 325 (65 FE) MG EC tablet Take 325 mg by mouth daily with breakfast.    ? formoterol (PERFOROMIST) 20 MCG/2ML nebulizer solution Inhale 1 vial via nebulizer two times a day. (  Patient not taking: Reported on 12/27/2021) 120 mL 11  ? ondansetron (ZOFRAN-ODT) 4 MG disintegrating tablet Take 1 tablet (4 mg total) by mouth every 8 (eight) hours as needed for nausea or vomiting. (Patient not taking: Reported on 12/27/2021) 20 tablet 0  ? Oxycodone HCl 10 MG TABS Take 1 tablet by mouth every 4 hours as needed for pain (max 6 tablets daily)*10/20/21 (Patient not taking: Reported on 12/27/2021) 180 tablet 0  ? Oxycodone HCl 10 MG TABS Take 1 tablet by mouth every 4 hours as needed for pain (max 6 tablets daily). *11/18/21 (Patient not taking: Reported on 12/27/2021) 180 tablet 0  ? ? ?No results found. However, due to the size of the patient record, not all encounters were searched. Please check Results Review for a complete set of results. ?No results found. ? ?Review of Systems ? ?Blood pressure (!) 103/57, pulse 82, temperature (!) 97.5 ?F (36.4 ?C), temperature source Oral, resp. rate 18, height '5\' 10"'$  (1.778 m), weight 99.8 kg, last menstrual period 05/17/2016, SpO2 99 %. ?Physical Exam ?HENT:  ?   Mouth/Throat:  ?   Mouth: Mucous membranes are moist.  ?   Pharynx: Oropharynx is clear.  ?Eyes:  ?   General: No scleral icterus. ?   Conjunctiva/sclera: Conjunctivae normal.   ?Cardiovascular:  ?   Rate and Rhythm: Normal rate and regular rhythm.  ?   Heart sounds: Normal heart sounds. No murmur heard. ?Pulmonary:  ?   Effort: Pulmonary effort is normal.  ?   Breath sounds: Normal breath sounds.  ?Abdominal:

## 2022-01-03 NOTE — Anesthesia Preprocedure Evaluation (Signed)
Anesthesia Evaluation  ?Patient identified by MRN, date of birth, ID band ?Patient awake ? ? ? ?Reviewed: ?Allergy & Precautions, H&P , NPO status , Patient's Chart, lab work & pertinent test results, reviewed documented beta blocker date and time  ? ?History of Anesthesia Complications ?(+) PONV and history of anesthetic complications ? ?Airway ?Mallampati: II ? ?TM Distance: >3 FB ?Neck ROM: full ? ? ? Dental ?no notable dental hx. ? ?  ?Pulmonary ?sleep apnea , COPD,  ?  ?Pulmonary exam normal ?breath sounds clear to auscultation ? ? ? ? ? ? Cardiovascular ?Exercise Tolerance: Good ?negative cardio ROS ? ? ?Rhythm:regular Rate:Normal ? ? ?  ?Neuro/Psych ?PSYCHIATRIC DISORDERS Anxiety Depression  Neuromuscular disease   ? GI/Hepatic ?Neg liver ROS, GERD  Medicated,  ?Endo/Other  ?Hypothyroidism  ? Renal/GU ?negative Renal ROS  ?negative genitourinary ?  ?Musculoskeletal ? ? Abdominal ?  ?Peds ? Hematology ? ?(+) Blood dyscrasia, anemia ,   ?Anesthesia Other Findings ? ? Reproductive/Obstetrics ?negative OB ROS ? ?  ? ? ? ? ? ? ? ? ? ? ? ? ? ?  ?  ? ? ? ? ? ? ? ? ?Anesthesia Physical ?Anesthesia Plan ? ?ASA: 3 ? ?Anesthesia Plan: General  ? ?Post-op Pain Management:   ? ?Induction:  ? ?PONV Risk Score and Plan: Propofol infusion ? ?Airway Management Planned:  ? ?Additional Equipment:  ? ?Intra-op Plan:  ? ?Post-operative Plan:  ? ?Informed Consent: I have reviewed the patients History and Physical, chart, labs and discussed the procedure including the risks, benefits and alternatives for the proposed anesthesia with the patient or authorized representative who has indicated his/her understanding and acceptance.  ? ? ? ?Dental Advisory Given ? ?Plan Discussed with: CRNA ? ?Anesthesia Plan Comments:   ? ? ? ? ? ? ?Anesthesia Quick Evaluation ? ?

## 2022-01-03 NOTE — Transfer of Care (Signed)
Immediate Anesthesia Transfer of Care Note ? ?Patient: Kathleen [name] ? ?Procedure(s) Performed: COLONOSCOPY WITH PROPOFOL ?POLYPECTOMY INTESTINAL ? ?Patient Location: Short Stay ? ?Anesthesia Type:General ? ?Level of Consciousness: sedated ? ?Airway & Oxygen Therapy: Patient Spontanous Breathing ? ?Post-op Assessment: Report given to RN and Post -op Vital signs reviewed and stable ? ?Post vital signs: Reviewed and stable ? ?Last Vitals:  ?Vitals Value Taken Time  ?BP 83/51 01/03/22 1346  ?Temp 36.6 ?C 01/03/22 1346  ?Pulse 85 01/03/22 1346  ?Resp 20 01/03/22 1346  ?SpO2 93 % 01/03/22 1346  ? ? ?Last Pain:  ?Vitals:  ? 01/03/22 1346  ?TempSrc: Axillary  ?PainSc:   ?   ? ?Patients Stated Pain Goal: 8 (01/03/22 1152) ? ?Complications: No notable events documented. ?

## 2022-01-04 NOTE — Anesthesia Postprocedure Evaluation (Signed)
Anesthesia Post Note ? ?Patient: Kathleen [name] ? ?Procedure(s) Performed: COLONOSCOPY WITH PROPOFOL ?POLYPECTOMY INTESTINAL ? ?Patient location during evaluation: Phase II ?Anesthesia Type: General ?Level of consciousness: awake ?Pain management: pain level controlled ?Vital Signs Assessment: post-procedure vital signs reviewed and stable ?Respiratory status: spontaneous breathing and respiratory function stable ?Cardiovascular status: blood pressure returned to baseline and stable ?Postop Assessment: no headache and no apparent nausea or vomiting ?Anesthetic complications: no ?Comments: Late entry ? ? ?No notable events documented. ? ? ?Last Vitals:  ?Vitals:  ? 01/03/22 1400 01/03/22 1444  ?BP: 115/85   ?Pulse: 94   ?Resp: (!) 26   ?Temp:    ?SpO2: 100% 100%  ?  ?Last Pain:  ?Vitals:  ? 01/03/22 1355  ?TempSrc:   ?PainSc: 10-Worst pain ever  ? ? ?  ?  ?  ?  ?  ?  ? ?Louann Sjogren ? ? ? ? ?

## 2022-01-05 ENCOUNTER — Ambulatory Visit (INDEPENDENT_AMBULATORY_CARE_PROVIDER_SITE_OTHER): Payer: 59 | Admitting: Family Medicine

## 2022-01-05 ENCOUNTER — Other Ambulatory Visit (HOSPITAL_COMMUNITY): Payer: Self-pay

## 2022-01-05 VITALS — BP 107/76 | HR 76 | Temp 97.8°F | Wt 238.8 lb

## 2022-01-05 DIAGNOSIS — R1110 Vomiting, unspecified: Secondary | ICD-10-CM

## 2022-01-05 DIAGNOSIS — Z79891 Long term (current) use of opiate analgesic: Secondary | ICD-10-CM | POA: Diagnosis not present

## 2022-01-05 DIAGNOSIS — J849 Interstitial pulmonary disease, unspecified: Secondary | ICD-10-CM

## 2022-01-05 LAB — SURGICAL PATHOLOGY

## 2022-01-05 MED ORDER — EPINEPHRINE 0.3 MG/0.3ML IJ SOAJ
0.3000 mg | INTRAMUSCULAR | 2 refills | Status: DC | PRN
  Filled 2022-01-05: qty 2, 2d supply, fill #0

## 2022-01-05 MED ORDER — OXYCODONE HCL 10 MG PO TABS
ORAL_TABLET | ORAL | 0 refills | Status: DC
  Filled 2022-01-05: qty 180, fill #0
  Filled 2022-03-19: qty 180, 30d supply, fill #0

## 2022-01-05 MED ORDER — OXYCODONE HCL 10 MG PO TABS
ORAL_TABLET | ORAL | 0 refills | Status: DC
  Filled 2022-01-05: qty 180, fill #0
  Filled 2022-02-16: qty 180, 30d supply, fill #0

## 2022-01-05 MED ORDER — OXYCODONE HCL 10 MG PO TABS
ORAL_TABLET | ORAL | 0 refills | Status: DC
  Filled 2022-01-05: qty 180, fill #0
  Filled 2022-01-16: qty 180, 90d supply, fill #0
  Filled 2022-01-17: qty 180, 30d supply, fill #0

## 2022-01-05 NOTE — Patient Instructions (Signed)
For 7 days drop Prozac 2 each day ?Then drop it to 1 per day ? ?Call us in 2 to 3 weeks to give [place] update ? ?Remember the GI doc at [facility] can see all notes from up here by using Careeverywhere feature in the Epic medical record soft ware. ?

## 2022-01-05 NOTE — Progress Notes (Signed)
? ?Subjective:  ? ? Patient ID: Kathleen [name], female    DOB: 1960/12/06, 61 y.o.   MRN: [id number] ? ?HPI ? ?Patient here for 3 month follow up ?Patient has ongoing hip pain discomfort hurts to put any pressure on it hurts to walk ?She has chronic nausea and vomiting for which she has been doing the best she can with healthy eating yet still having quite some trouble ?She saw gastroenterology who did extensive amount of testing endoscopy.  According to the patient they offered her a feeding tube which caused her significant anxiety ?They did try gastric emptying study but the patient vomited up the eggs ?Patient needs refill on epi-pen and Zofran. She also wants to change the Protonix. ? ?This patient was seen today for chronic pain ? ?The medication list was reviewed and updated. ? ?Location of Pain for which the patient has been treated with regarding narcotics: Hip pain back pain primarily at the hip pain due to avascular necrosis-orthopedic surgeons have stated with her underlying lung issues they will not do surgery ? ?Onset of this pain: She has had this problem for several years ? ? -Compliance with medication: Good compliance with medicine ? ?- Number patient states they take daily: Typically 6/day ? ?-when was the last dose patient took?  Earlier today ? ?The patient was advised the importance of maintaining medication and not using illegal substances with these. ? ?Here for refills and follow up ? ?The patient was educated that we can provide 3 monthly scripts for their medication, it is their responsibility to follow the instructions. ? ?Side effects or complications from medications: Denies side effects ? ?Patient is aware that pain medications are meant to minimize the severity of the pain to allow their pain levels to improve to allow for better function. They are aware of that pain medications cannot totally remove their pain. ? ?Due for UDT ( at least once per year) : Earlier this year ? ?Scale of 1  to 10 ( 1 is least 10 is most) ?Your pain level without the medicine: 8 ?Your pain level with medication 3 ? ?Scale 1 to 10 ( 1-helps very little, 10 helps very well) ?How well does your pain medication reduce your pain so you can function better through out the day?  7 ? ?Quality of the pain: Constant burning aching pain ? ?Persistence of the pain: Present all the time ? ?Modifying factors: Worse with walking ? ? ?  ? ?Review of Systems ? ?   ?Objective:  ? Physical Exam ?General-in no acute distress ?Eyes-no discharge ?Lungs-respiratory rate normal, CTA ?CV-no murmurs,RRR ?Extremities skin warm dry no edema ?Neuro grossly normal ?Behavior normal, alert ? ?Abdomen is soft no guarding rebound or tenderness ? ? ?   ?Assessment & Plan:  ? ?1. Non-intractable vomiting ?Patient has intermittent vomiting spells that is making her quality of life very diminished is also altering how much she can eat and tolerate.  She will be getting a second opinion from gastroenterology at [facility] she will let us know if she needs our help ? ?2. Encounter for long-term opiate analgesic use ?Pain medication today ?Benefit of pain medicine outweighs risk ?I did encourage patient to try to reduce her pain medicine from 6 tablets a day down to 5/day so would have less effect on her gastric motility ?She stated she will try but she is relatively anxious that her pain would get worse ?Understandably so ? ?Recheck again in 3 months ?The  patient was seen in followup for chronic pain. ?A review over at their current pain status was discussed. Drug registry was checked. ?Prescriptions were given.  Regular follow-up recommended. ?Discussion was held regarding the importance of compliance with medication as well as pain medication contract. ? ?Patient was informed that medication may cause drowsiness and should not be combined  with other medications/alcohol or street drugs. If the patient feels medication is causing altered alertness then do not drive  or operate dangerous equipment. ? ?Should be noted that the patient appears to be meeting appropriate use of opioids and response.  Evidenced by improved function and decent pain control without significant side effects and no evidence of overt aberrancy issues.  Upon discussion with the patient today they understand that opioid therapy is optional and they feel that the pain has been refractory to reasonable conservative measures and is significant and affecting quality of life enough to warrant ongoing therapy and wishes to continue opioids.  Refills were provided. ? ?Patient does have interstitial lung disease she is followed closely by pulmonary specialist she is disabled ? ?Finally we will reduce her Prozac her moods are doing well we will try a lower dose to see if that would help with the nausea go down to 20 mg once daily ? ?

## 2022-01-10 ENCOUNTER — Other Ambulatory Visit (HOSPITAL_COMMUNITY): Payer: Self-pay

## 2022-01-10 ENCOUNTER — Encounter (HOSPITAL_COMMUNITY): Payer: Self-pay | Admitting: Internal Medicine

## 2022-01-16 ENCOUNTER — Other Ambulatory Visit (INDEPENDENT_AMBULATORY_CARE_PROVIDER_SITE_OTHER): Payer: Self-pay | Admitting: Internal Medicine

## 2022-01-16 ENCOUNTER — Other Ambulatory Visit (HOSPITAL_COMMUNITY): Payer: Self-pay

## 2022-01-16 DIAGNOSIS — J849 Interstitial pulmonary disease, unspecified: Secondary | ICD-10-CM | POA: Diagnosis not present

## 2022-01-16 DIAGNOSIS — M6281 Muscle weakness (generalized): Secondary | ICD-10-CM | POA: Diagnosis not present

## 2022-01-16 DIAGNOSIS — D860 Sarcoidosis of lung: Secondary | ICD-10-CM | POA: Diagnosis not present

## 2022-01-16 DIAGNOSIS — J9601 Acute respiratory failure with hypoxia: Secondary | ICD-10-CM | POA: Diagnosis not present

## 2022-01-16 MED ORDER — SUCRALFATE 1 GM/10ML PO SUSP
1.0000 g | Freq: Four times a day (QID) | ORAL | 3 refills | Status: DC
  Filled 2022-01-16: qty 30, 1d supply, fill #0
  Filled 2022-01-17: qty 390, 10d supply, fill #0

## 2022-01-17 ENCOUNTER — Other Ambulatory Visit (HOSPITAL_COMMUNITY): Payer: Self-pay
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
## 2022-01-19 ENCOUNTER — Other Ambulatory Visit (HOSPITAL_COMMUNITY): Payer: Self-pay

## 2022-01-19 DIAGNOSIS — D860 Sarcoidosis of lung: Secondary | ICD-10-CM | POA: Diagnosis not present

## 2022-01-19 DIAGNOSIS — M6281 Muscle weakness (generalized): Secondary | ICD-10-CM | POA: Diagnosis not present

## 2022-01-19 DIAGNOSIS — J849 Interstitial pulmonary disease, unspecified: Secondary | ICD-10-CM | POA: Diagnosis not present

## 2022-01-19 DIAGNOSIS — J9601 Acute respiratory failure with hypoxia: Secondary | ICD-10-CM | POA: Diagnosis not present

## 2022-01-26 ENCOUNTER — Other Ambulatory Visit (HOSPITAL_COMMUNITY): Payer: Self-pay

## 2022-01-26 ENCOUNTER — Other Ambulatory Visit: Payer: Self-pay | Admitting: *Deleted

## 2022-01-26 ENCOUNTER — Telehealth: Payer: Self-pay | Admitting: Family Medicine

## 2022-01-26 MED ORDER — DOXYCYCLINE HYCLATE 100 MG PO TABS
100.0000 mg | ORAL_TABLET | Freq: Two times a day (BID) | ORAL | 0 refills | Status: DC
Start: 2022-01-26 — End: 2022-03-13

## 2022-01-26 MED ORDER — DOXYCYCLINE HYCLATE 100 MG PO TABS: 100.0000 mg | ORAL_TABLET | Freq: Two times a day (BID) | ORAL | 0 refills | Status: DC

## 2022-01-26 NOTE — Telephone Encounter (Signed)
Patient is requesting refill on doxycycline 100 mg for abscess coming up . [facility]

## 2022-01-26 NOTE — Telephone Encounter (Signed)
Prescription sent electronically to pharmacy. Patient notified and advised of provider's recommendations. Patient verbalized understanding

## 2022-01-26 NOTE — Telephone Encounter (Signed)
100 mg twice daily for 10 days take with snack and a tall glass of water Warm compresses If progressive troubles or problems follow-up here or ER

## 2022-01-30 ENCOUNTER — Ambulatory Visit (INDEPENDENT_AMBULATORY_CARE_PROVIDER_SITE_OTHER): Payer: 59 | Admitting: Internal Medicine

## 2022-01-30 ENCOUNTER — Encounter (INDEPENDENT_AMBULATORY_CARE_PROVIDER_SITE_OTHER): Payer: Self-pay | Admitting: Internal Medicine

## 2022-02-01 ENCOUNTER — Ambulatory Visit (INDEPENDENT_AMBULATORY_CARE_PROVIDER_SITE_OTHER): Payer: 59 | Admitting: Internal Medicine

## 2022-02-05 ENCOUNTER — Other Ambulatory Visit (HOSPITAL_COMMUNITY): Payer: Self-pay

## 2022-02-12 ENCOUNTER — Other Ambulatory Visit (HOSPITAL_COMMUNITY): Payer: Self-pay

## 2022-02-12 ENCOUNTER — Other Ambulatory Visit: Payer: Self-pay | Admitting: "Endocrinology

## 2022-02-12 MED ORDER — LEVOTHYROXINE SODIUM 50 MCG PO TABS
50.0000 ug | ORAL_TABLET | Freq: Every day | ORAL | 1 refills | Status: DC
  Filled 2022-02-12: qty 90, 90d supply, fill #0
  Filled 2022-02-13: qty 14, 14d supply, fill #0
  Filled 2022-02-26: qty 90, 90d supply, fill #1
  Filled 2022-05-14: qty 30, 30d supply, fill #2
  Filled 2022-06-11: qty 30, 30d supply, fill #3

## 2022-02-13 ENCOUNTER — Other Ambulatory Visit (HOSPITAL_COMMUNITY): Payer: Self-pay

## 2022-02-16 ENCOUNTER — Other Ambulatory Visit (HOSPITAL_COMMUNITY): Payer: Self-pay

## 2022-02-16 DIAGNOSIS — J9601 Acute respiratory failure with hypoxia: Secondary | ICD-10-CM | POA: Diagnosis not present

## 2022-02-16 DIAGNOSIS — J849 Interstitial pulmonary disease, unspecified: Secondary | ICD-10-CM | POA: Diagnosis not present

## 2022-02-16 DIAGNOSIS — M6281 Muscle weakness (generalized): Secondary | ICD-10-CM | POA: Diagnosis not present

## 2022-02-16 DIAGNOSIS — D860 Sarcoidosis of lung: Secondary | ICD-10-CM | POA: Diagnosis not present

## 2022-02-19 DIAGNOSIS — J849 Interstitial pulmonary disease, unspecified: Secondary | ICD-10-CM | POA: Diagnosis not present

## 2022-02-19 DIAGNOSIS — D860 Sarcoidosis of lung: Secondary | ICD-10-CM | POA: Diagnosis not present

## 2022-02-19 DIAGNOSIS — M6281 Muscle weakness (generalized): Secondary | ICD-10-CM | POA: Diagnosis not present

## 2022-02-19 DIAGNOSIS — J9601 Acute respiratory failure with hypoxia: Secondary | ICD-10-CM | POA: Diagnosis not present

## 2022-02-21 ENCOUNTER — Other Ambulatory Visit (HOSPITAL_COMMUNITY): Payer: Self-pay

## 2022-02-21 ENCOUNTER — Other Ambulatory Visit (INDEPENDENT_AMBULATORY_CARE_PROVIDER_SITE_OTHER): Payer: Self-pay | Admitting: Internal Medicine

## 2022-02-23 ENCOUNTER — Telehealth: Payer: Self-pay

## 2022-02-23 NOTE — Telephone Encounter (Signed)
Spoke with patient and she states she had already picked this prescription up today and this request would be for future refills. She denies any fevers or any passing out or vomiting or any other symptoms at this time, she using doxy for chronic under arm abscess along with warm packs .  She has been informed to call back Monday if not better .

## 2022-02-23 NOTE — Telephone Encounter (Signed)
Encourage patient to contact the pharmacy for refills or they can request refills through [facility]  (Please schedule appointment if patient has not been seen in over a year)    WHAT [place] THIS SENT TO: [facility] [place], [place]. [place]   MEDICATION NAME & DOSE:doxycycline (VIBRA-TABS) 100 MG tablet   NOTES/COMMENTS FROM PATIENT:      [place] office please notify patient: It takes 48-72 hours to process rx refill requests Ask patient to call pharmacy to ensure rx is ready before heading there.

## 2022-02-23 NOTE — Telephone Encounter (Signed)
Please advise. Thank you

## 2022-02-23 NOTE — Telephone Encounter (Signed)
Nurses-I assume she probably has a skin abscess or infection. Please talk with her if she is having anything that sounds severe such as high fevers passing out vomiting cannot keep things down it would be wise for her to go to the ER if it is more of a localized skin infection she may have doxycycline 100 mg twice daily for 10 days utilize warm compresses and if getting significantly worse go to ER or if not improving by Monday to call us back so we can fit her in thank you

## 2022-02-24 ENCOUNTER — Other Ambulatory Visit (HOSPITAL_COMMUNITY): Payer: Self-pay

## 2022-02-26 ENCOUNTER — Other Ambulatory Visit: Payer: Self-pay | Admitting: "Endocrinology

## 2022-02-26 ENCOUNTER — Other Ambulatory Visit (HOSPITAL_COMMUNITY): Payer: Self-pay

## 2022-02-28 ENCOUNTER — Other Ambulatory Visit (HOSPITAL_COMMUNITY): Payer: Self-pay

## 2022-02-28 MED ORDER — HYDROCORTISONE 10 MG PO TABS
ORAL_TABLET | ORAL | 3 refills | Status: DC
  Filled 2022-02-28: qty 120, 30d supply, fill #0
  Filled 2022-04-06: qty 120, 30d supply, fill #1
  Filled 2022-05-08: qty 120, 30d supply, fill #2
  Filled 2022-06-18: qty 120, 30d supply, fill #3

## 2022-02-28 NOTE — Telephone Encounter (Signed)
Pt called back stating she is now having some dizziness

## 2022-03-02 ENCOUNTER — Emergency Department (HOSPITAL_BASED_OUTPATIENT_CLINIC_OR_DEPARTMENT_OTHER)
Admission: EM | Admit: 2022-03-02 | Discharge: 2022-03-02 | Disposition: A | Discharge status: Home / Self Care | Payer: 59 | Attending: Emergency Medicine | Admitting: Emergency Medicine

## 2022-03-02 ENCOUNTER — Emergency Department (HOSPITAL_BASED_OUTPATIENT_CLINIC_OR_DEPARTMENT_OTHER): Payer: 59

## 2022-03-02 ENCOUNTER — Encounter (HOSPITAL_BASED_OUTPATIENT_CLINIC_OR_DEPARTMENT_OTHER): Payer: Self-pay

## 2022-03-02 ENCOUNTER — Other Ambulatory Visit: Payer: Self-pay

## 2022-03-02 DIAGNOSIS — Z79899 Other long term (current) drug therapy: Secondary | ICD-10-CM | POA: Diagnosis not present

## 2022-03-02 DIAGNOSIS — R1084 Generalized abdominal pain: Secondary | ICD-10-CM | POA: Diagnosis not present

## 2022-03-02 DIAGNOSIS — R140 Abdominal distension (gaseous): Secondary | ICD-10-CM

## 2022-03-02 DIAGNOSIS — I700 Atherosclerosis of aorta: Secondary | ICD-10-CM | POA: Diagnosis not present

## 2022-03-02 DIAGNOSIS — R109 Unspecified abdominal pain: Secondary | ICD-10-CM | POA: Diagnosis not present

## 2022-03-02 LAB — CBC
HCT: 37.4 % (ref 36.0–46.0)
Hemoglobin: 12 g/dL (ref 12.0–15.0)
MCH: 28.7 pg (ref 26.0–34.0)
MCHC: 32.1 g/dL (ref 30.0–36.0)
MCV: 89.5 fL (ref 80.0–100.0)
Platelets: 426 10*3/uL — ABNORMAL HIGH (ref 150–400)
RBC: 4.18 MIL/uL (ref 3.87–5.11)
RDW: 13.4 % (ref 11.5–15.5)
WBC: 11.7 10*3/uL — ABNORMAL HIGH (ref 4.0–10.5)
nRBC: 0 % (ref 0.0–0.2)

## 2022-03-02 LAB — COMPREHENSIVE METABOLIC PANEL
ALT: 11 U/L (ref 0–44)
AST: 15 U/L (ref 15–41)
Albumin: 4.2 g/dL (ref 3.5–5.0)
Alkaline Phosphatase: 68 U/L (ref 38–126)
Anion gap: 12 (ref 5–15)
BUN: 12 mg/dL (ref 6–20)
CO2: 26 mmol/L (ref 22–32)
Calcium: 9.5 mg/dL (ref 8.9–10.3)
Chloride: 105 mmol/L (ref 98–111)
Creatinine, Ser: 0.84 mg/dL (ref 0.44–1.00)
GFR, Estimated: >60 mL/min (ref >60)
Glucose, Bld: 109 mg/dL — ABNORMAL HIGH (ref 70–99)
Potassium: 4.2 mmol/L (ref 3.5–5.1)
Sodium: 143 mmol/L (ref 135–145)
Total Bilirubin: 0.3 mg/dL (ref 0.3–1.2)
Total Protein: 7 g/dL (ref 6.5–8.1)

## 2022-03-02 LAB — LIPASE, BLOOD: Lipase: 26 U/L (ref 11–51)

## 2022-03-02 MED ORDER — ONDANSETRON 4 MG PO TBDP: 4.0000 mg | ORAL_TABLET | Freq: Three times a day (TID) | ORAL | 0 refills | Status: DC | PRN

## 2022-03-02 MED ORDER — ONDANSETRON HCL 4 MG/2ML IJ SOLN
4.0000 mg | Freq: Once | INTRAMUSCULAR | Status: AC
Start: 2022-03-02 — End: 2022-03-02
  Administered 2022-03-02: 4 mg via INTRAVENOUS
  Filled 2022-03-02: qty 2

## 2022-03-02 MED ORDER — HYDROMORPHONE HCL 1 MG/ML IJ SOLN
1.0000 mg | Freq: Once | INTRAMUSCULAR | Status: AC
  Administered 2022-03-02: 1 mg via INTRAVENOUS
  Filled 2022-03-02: qty 1

## 2022-03-02 NOTE — ED Notes (Signed)
Discharge instructions were discussed with pt. Pt verbalized understanding with no questions at this time.

## 2022-03-02 NOTE — ED Notes (Signed)
Pt called out stating she had been waiting for discharge paperwork. Informed no discharge paperwork had been printed at this time. Requesting removal of IV. IV removed. Informed pt I would check on the status of her paperwork. Pt was upset as no one had given her an update. Pt requested to speak to charge nurse. Pt informed she was speaking to charge nurse and I would be back with an update shortly. Pt provided with juice and IV removed.

## 2022-03-02 NOTE — ED Triage Notes (Signed)
Pt reports abd pain and distention x1 month. Pt also complains of dizziness. Pt states that she has been unable to eat recently without vomiting.

## 2022-03-02 NOTE — Discharge Instructions (Signed)
Please follow-up with your primary care doctor.  Come back to ER as needed.  Can take the Zofran as needed for further nausea.  If you get a lot more abdominal pain, vomiting, fever or other new concerning symptom, come back to ER for reassessment.

## 2022-03-03 NOTE — ED Provider Notes (Signed)
[facility] DEPT Provider Note   CSN: [id number] Arrival date & time: 03/02/22  1510     History  Chief Complaint  Patient presents with   Abdominal Pain    Kathleen [name] is a 61 y.o. female.  Presented to the emergency department due to concern for abdominal pain.  Has been having pain for about a month, states that she is also noted distention in her abdomen.  Pain is primarily in her upper abdomen close to the belly.  Has had some associated vomiting.  Nonbloody nonbilious.  No fever or chills.  No dysuria or hematuria.  HPI     Home Medications Prior to Admission medications   Medication Sig Start Date End Date Taking? Authorizing Provider  ondansetron (ZOFRAN-ODT) 4 MG disintegrating tablet Take 1 tablet (4 mg total) by mouth every 8 (eight) hours as needed for nausea or vomiting. 03/02/22  Yes Lucrezia Starch, MD  acetaminophen (TYLENOL) 500 MG tablet Take 1,000 mg by mouth every 8 (eight) hours as needed for headache.    [provider]  albuterol (VENTOLIN HFA) 108 (90 Base) MCG/ACT inhaler Inhale 2 puffs into the lungs every 6 (six) hours as needed for wheezing or shortness of breath. 05/22/21   Roxan Hockey, MD  ascorbic acid (VITAMIN C) 500 MG tablet Take 1 tablet (500 mg total) by mouth daily. 05/23/21   Roxan Hockey, MD  budesonide (PULMICORT) 0.5 MG/2ML nebulizer solution Inhale 2 mL (0.5 mg total) by nebulization two (2) times a day as needed. Patient not taking: Reported on 01/05/2022 06/08/21     budesonide (PULMICORT) 0.5 MG/2ML nebulizer solution Inhale the contents of 1 vial via nebulizer two (2) times a day. 11/22/21     doxycycline (VIBRA-TABS) 100 MG tablet Take 1 tablet (100 mg total) by mouth 2 (two) times daily. take with snack and a tall glass of water 01/26/22   Sallee Lange A, MD  EPINEPHrine 0.3 mg/0.3 mL IJ SOAJ injection Inject 0.3 mg into the muscle as needed for anaphylaxis. 01/05/22   [name], MD  ferrous  sulfate 325 (65 FE) MG EC tablet Take 325 mg by mouth daily with breakfast.    [provider]  FLUoxetine (PROZAC) 20 MG capsule Take 3 capsules by mouth once a day. 06/14/21   [name], MD  formoterol (PERFOROMIST) 20 MCG/2ML nebulizer solution Inhale 1 vial via nebulizer two times a day. Patient not taking: Reported on 01/05/2022 06/21/21     formoterol (PERFOROMIST) 20 MCG/2ML nebulizer solution Inhale the contents of 1 vial via nebulizer two (2) times a day. 11/22/21     hydrocortisone (CORTEF) 10 MG tablet Take 2 tablets by mouth at 8:00 am and 1 tablet at lunch. (May double dose on sick days x 3-5 days) 02/28/22   [name], MD  hydrOXYzine (ATARAX) 25 MG tablet TAKE 1 TABLET BY MOUTH 3 TIMES DAILY AS NEEDED FOR ANXIETY 11/29/21 11/29/22  [name], MD  levothyroxine (SYNTHROID) 50 MCG tablet Take 1 tablet by mouth daily before breakfast. 02/12/22 08/11/22  [name], MD  linaclotide (LINZESS) 290 MCG CAPS capsule Take 1 capsule (290 mcg total) by mouth daily before breakfast. 02/21/21   [name], [name], MD  midodrine (PROAMATINE) 5 MG tablet TAKE 1 TABLET BY MOUTH TWICE DAILY WITH A MEAL 10/13/21 10/13/22  [name], MD  ondansetron (ZOFRAN-ODT) 8 MG disintegrating tablet DISSOLVE 1 TABLET BY MOUTH EVERY 8 HOURS AS NEEDED FOR NAUSEA &  VOMITING 11/29/21 11/29/22  [name], MD  Oxycodone HCl 10 MG TABS Take 1 tablet by mouth every 4 hours as needed for pain (max 6 tablets daily). 01/05/22   [name], MD  Oxycodone HCl 10 MG TABS Take 1 tablet by mouth every 4 hours as needed for pain (max 6 tablets daily). 01/05/22   [name], MD  Oxycodone HCl 10 MG TABS Take 1 tablet by mouth every 4 hours as needed for pain (max 6 tablets daily). 01/05/22   [name], MD  pantoprazole (PROTONIX) 40 MG tablet Take 1 tablet by mouth 2 times daily before a meal. 11/07/21 11/07/22  [name], [name], MD  predniSONE (DELTASONE) 5 MG tablet Take 1 tablet (5  mg total) by mouth in the morning. 06/21/21     sucralfate (CARAFATE) 1 GM/10ML suspension Take 10 mLs (1 g total) by mouth 4 (four) times daily. 01/16/22   Rogene Houston, MD      Allergies    Bee venom, Contrast media [iodinated contrast media], Lactose intolerance (gi), Bactrim [sulfamethoxazole-trimethoprim], Codeine, and Morphine and related    Review of Systems   Review of Systems  Constitutional:  Negative for chills and fever.  HENT:  Negative for ear pain and sore throat.   Eyes:  Negative for pain and visual disturbance.  Respiratory:  Negative for cough and shortness of breath.   Cardiovascular:  Negative for chest pain and palpitations.  Gastrointestinal:  Positive for abdominal pain, nausea and vomiting.  Genitourinary:  Negative for dysuria and hematuria.  Musculoskeletal:  Negative for arthralgias and back pain.  Skin:  Negative for color change and rash.  Neurological:  Negative for seizures and syncope.  All other systems reviewed and are negative.   Physical Exam Updated Vital Signs BP (!) 151/70 (BP Location: Right Arm)   Pulse 79   Temp 97.9 F (36.6 C) (Oral)   Resp 16   Ht '5\' 10"'$  (1.778 m)   Wt 99.8 kg   LMP 05/17/2016   SpO2 97%   BMI 31.57 kg/m  Physical Exam Vitals and nursing note reviewed.  Constitutional:      General: She is not in acute distress.    Appearance: She is well-developed.  HENT:     Head: Normocephalic and atraumatic.  Eyes:     Conjunctiva/sclera: Conjunctivae normal.  Cardiovascular:     Rate and Rhythm: Normal rate and regular rhythm.     Heart sounds: No murmur heard. Pulmonary:     Effort: Pulmonary effort is normal. No respiratory distress.     Breath sounds: Normal breath sounds.  Abdominal:     Palpations: Abdomen is soft.     Tenderness: There is generalized abdominal tenderness. There is no guarding or rebound.  Musculoskeletal:        General: No swelling.     Cervical back: Neck supple.  Skin:    General:  Skin is warm and dry.     Capillary Refill: Capillary refill takes less than 2 seconds.  Neurological:     Mental Status: She is alert.  Psychiatric:        Mood and Affect: Mood normal.     ED Results / Procedures / Treatments   Labs (all labs ordered are listed, but only abnormal results are displayed) Labs Reviewed  COMPREHENSIVE METABOLIC PANEL - Abnormal; Notable for the following components:      Result Value   Glucose, Bld 109 (*)  All other components within normal limits  CBC - Abnormal; Notable for the following components:   WBC 11.7 (*)    Platelets 426 (*)    All other components within normal limits  LIPASE, BLOOD    EKG None  Radiology CT ABDOMEN PELVIS WO CONTRAST  Result Date: 03/02/2022 CLINICAL DATA:  Abdominal pain, acute nonlocalized. EXAM: CT ABDOMEN AND PELVIS WITHOUT CONTRAST TECHNIQUE: Multidetector CT imaging of the abdomen and pelvis was performed following the standard protocol without IV contrast. RADIATION DOSE REDUCTION: This exam was performed according to the departmental dose-optimization program which includes automated exposure control, adjustment of the mA and/or kV according to patient size and/or use of iterative reconstruction technique. COMPARISON:  CT examination dated January 26, 2020 FINDINGS: Lower chest: No acute abnormality. Hepatobiliary: No focal liver abnormality is seen. Status post cholecystectomy. No biliary dilatation. Pancreas: Moderate fatty infiltration of the head and neck of the pancreas. No pancreatic ductal dilatation or surrounding inflammatory changes. Spleen: Normal in size without focal abnormality. Adrenals/Urinary Tract: Adrenal glands are unremarkable. Kidneys are normal, without renal calculi, focal lesion, or hydronephrosis. Bladder is unremarkable. Stomach/Bowel: Stomach is within normal limits. Appendix not identified, however no inflammatory changes in the pericecal region. No evidence of bowel wall thickening,  distention, or inflammatory changes. Vascular/Lymphatic: Mild aortic atherosclerosis. No enlarged abdominal or pelvic lymph nodes. Reproductive: Uterus and bilateral adnexa are unremarkable. Other: No abdominal wall hernia or abnormality. No abdominopelvic ascites. Musculoskeletal: No acute or significant osseous findings. IMPRESSION: 1.  No CT evidence of acute abdominal/pelvic process. 2.  Moderate atrophy of the head and neck of the pancreas. 3.  Status post cholecystectomy. 4. Mild atherosclerotic disease of abdominal aorta and branch vessels. Electronically Signed   By: Keane Police D.O.   On: 03/02/2022 16:42    Procedures Procedures    Medications Ordered in ED Medications  ondansetron (ZOFRAN) injection 4 mg (4 mg Intravenous Given 03/02/22 1615)  HYDROmorphone (DILAUDID) injection 1 mg (1 mg Intravenous Given 03/02/22 1615)    ED Course/ Medical Decision Making/ A&P                           Medical Decision Making Amount and/or Complexity of Data Reviewed Labs: ordered. Radiology: ordered.  Risk Prescription drug management.   61 year old lady presented to ER due to concern for generalized abdominal pain for 1 month with associated abdominal distention.  Reviewed basic labs, normal electrolytes, normal LFTs, normal lipase.  Doubt pancreatitis or acute hepatobiliary process, leukocytosis mild.  Has history of cholecystectomy.  Check CT scan to evaluate for diverticulitis, appendicitis or other acute abdominal process.  I independently reviewed and interpreted CT results, did not appreciate any acute abdominal findings.  Reviewed radiology report, no acute findings.  On reassessment her pain has resolved.  Tolerating p.o.  I had originally ordered urinalysis however patient does not want to wait in the ER any longer and is requesting to be discharged.  No specific urinary symptoms and my suspicion for this relatively low, therefore we will honor patient's request and discharge her at this  time.  She has appoint with GI in a couple weeks.  Encouraged her to follow-up with primary care in addition to GI.  Reviewed return precautions and discharge.  After the discussed management above, the patient was determined to be safe for discharge.  The patient was in agreement with this plan and all questions regarding their care were answered.  ED return  precautions were discussed and the patient will return to the ED with any significant worsening of condition.         Final Clinical Impression(s) / ED Diagnoses Final diagnoses:  Generalized abdominal pain  Abdominal distension    Rx / DC Orders ED Discharge Orders          Ordered    ondansetron (ZOFRAN-ODT) 4 MG disintegrating tablet  Every 8 hours PRN        03/02/22 1941              Lucrezia Starch, MD 03/03/22 [phone]

## 2022-03-07 ENCOUNTER — Other Ambulatory Visit (HOSPITAL_COMMUNITY): Payer: Self-pay

## 2022-03-12 NOTE — Progress Notes (Unsigned)
03/13/2022 Kathleen [name] [id number] 02/09/1961  Referring provider: [name], MD Primary GI doctor:  [name] (Dr. [name] in [place] last saw 10/2021)  ASSESSMENT AND PLAN:    Nausea and vomiting, unspecified vomiting type with epigastric pain on chronic narcotics 10 mg oxycodone 6 times daily Patient's had very thorough work-up in [place] recent EGD, colonoscopy Patient unable to tolerate solid food states does better with liquid foods. With last GES study had vomiting and could not complete. Last endoscopy 2022 showed mild gastritis, continues on PPI We will get upper GI study to evaluate further Most likely this is related to gastroparesis with chronic opioid use, appears to potentially have a global slowing of the GI tract. Discussed trying to cut back on oxycodone and discuss with primary care about alternative pain medications. Patient has failed Reglan in the past With global slowing we will try Motegrity, will get KUB first to evaluate for obstruction prior to starting. If patient is unable to afford Motegrity report results will discuss with physician about further recommendations. Can potentially try Movantik. -     DG UGI W DOUBLE CM (HD BA); Future -     DG Abd 1 View; Future -     Prucalopride Succinate (MOTEGRITY) 2 MG TABS; Take 1 tablet (2 mg total) by mouth daily.  Chronic idiopathic constipation -     DG Abd 1 View; Future Likely also related to oxycodone use, decreased mobility, etc. Can do trial of Movantik if Motegrity does not help. Recent colonoscopy 01/03/2022  Adrenal insufficiency Patient on hydrocortisone  Sarcoidosis/interstitial lung disease  on oxygen at night, high risk for procedures and patient's had very thorough work-up. On prednisone 5 mg   Patient Care Team: [name], MD as PCP - General (Family Medicine)  HISTORY OF PRESENT ILLNESS: 61 y.o. female with a past medical history of adrenocortical  insufficiency, fibromyalgia, avascular necrosis of hip, chronic pain on narcotics, sarcoidosis with COPD, chronic GERD, abdominal pain, IBS with constipation and others listed below presents for evaluation of abdominal pain, vomiting, change in bowel habits.  Patient has longstanding history of with Dr. [name] in [place] for her chronic abdominal pain, nausea and vomiting, most recently had a colonoscopy with him in May. Dr. [name] retired in July, so she is wanting another provider.  He states he wanted to put a feeding tube in for her.   2020 GES unremarkable 01/2021 EGD gastritis negative H. pylori.  Postdilatation due to dysphagia 03/14/2021 HIDA scan EF 92% symptoms were reproduced. 04/17/2021 cholecystectomy with Dr. Arnoldo Morale 05/09/2021 diarrhea treated with Cholestyramine gradually returned back to baseline constipation and was back on Linzess. 11/07/2021 office visit with [facility] Dr. [name] complaining of vomiting and nausea and specifically after 3 or 4.  Thought GES versus adrenal insuff.  IBS constipation and chronic abdominal pain, on several psychotropic medications narcotics.  On Linzess 11/13/2021 nondiagnostic GES study due to vomiting after eating 01/03/2022 colonoscopy For rectal bleeding external hemorrhoids, small polyp ascending colon.  Showed tubular adenoma 03/02/2022 CT abdomen pelvis without contrast for abdominal pain, acute nonlocalized with moderate atrophy of the head of and neck of the pancreas mild atherosclerotic disease abdominal aorta and branch vessels, status post cholecystectomy  Patient has chronic pain.  Patient is on oxycodone 10 mg normally 6 tablets daily. Husband Kathleen [name] is here with her.   She can eat apple sauce, creamed potatoes but she can not eat solid food. She is able to do liquid/tea  without issues.  If she eats solid food she has severe epigastric pain.  States never has dysphagia.  Was having vomiting every day with supper with vomiting  through night, would get periodic fluids but this has improved.  She has since tapered off prozac and went to liquid so she is vomiting periodically, mainly after 10-15 mins has pain and nausea.  On zofran which does help.  She has had weight gain, no weight loss.  She has bloating, worse top of her AB.  Has been 4-5 days since having BM, she is on linzess.  She is on oxygen at night, she is only on cortisol morning and evening.  Reglan had bad reaction.   Patient is very frustrated with not knowing what is going on, very concerned there is obstruction.  Reassured that with recent CT and EGD no signs.  We will repeat KUB today. Patient states she can just live off liquids, needs to know what is going on.  When discussed potentially part of oxycodone use.  Patient slightly defensive  Current Medications:   Current Outpatient Medications (Endocrine & Metabolic):    hydrocortisone (CORTEF) 10 MG tablet, Take 2 tablets by mouth at 8:00 am and 1 tablet at lunch. (May double dose on sick days x 3-5 days)   levothyroxine (SYNTHROID) 50 MCG tablet, Take 1 tablet by mouth daily before breakfast.   predniSONE (DELTASONE) 5 MG tablet, Take 1 tablet (5 mg total) by mouth in the morning.  Current Outpatient Medications (Cardiovascular):    midodrine (PROAMATINE) 5 MG tablet, TAKE 1 TABLET BY MOUTH TWICE DAILY WITH A MEAL   EPINEPHrine 0.3 mg/0.3 mL IJ SOAJ injection, Inject 0.3 mg into the muscle as needed for anaphylaxis. (Patient not taking: Reported on 03/13/2022)  Current Outpatient Medications (Respiratory):    albuterol (VENTOLIN HFA) 108 (90 Base) MCG/ACT inhaler, Inhale 2 puffs into the lungs every 6 (six) hours as needed for wheezing or shortness of breath.   budesonide (PULMICORT) 0.5 MG/2ML nebulizer solution, Inhale the contents of 1 vial via nebulizer two (2) times a day.   formoterol (PERFOROMIST) 20 MCG/2ML nebulizer solution, Inhale the contents of 1 vial via nebulizer two (2) times a  day.   budesonide (PULMICORT) 0.5 MG/2ML nebulizer solution, Inhale 2 mL (0.5 mg total) by nebulization two (2) times a day as needed. (Patient not taking: Reported on 01/05/2022)   formoterol (PERFOROMIST) 20 MCG/2ML nebulizer solution, Inhale 1 vial via nebulizer two times a day. (Patient not taking: Reported on 01/05/2022)  Current Outpatient Medications (Analgesics):    acetaminophen (TYLENOL) 500 MG tablet, Take 1,000 mg by mouth every 8 (eight) hours as needed for headache.   Oxycodone HCl 10 MG TABS, Take 1 tablet by mouth every 4 hours as needed for pain (max 6 tablets daily).   Oxycodone HCl 10 MG TABS, Take 1 tablet by mouth every 4 hours as needed for pain (max 6 tablets daily). (Patient not taking: Reported on 03/13/2022)   Oxycodone HCl 10 MG TABS, Take 1 tablet by mouth every 4 hours as needed for pain (max 6 tablets daily). (Patient not taking: Reported on 03/13/2022)  Current Outpatient Medications (Hematological):    ferrous sulfate 325 (65 FE) MG EC tablet, Take 325 mg by mouth as needed.  Current Outpatient Medications (Other):    ascorbic acid (VITAMIN C) 500 MG tablet, Take 1 tablet (500 mg total) by mouth daily.   FLUoxetine (PROZAC) 20 MG capsule, Take 3 capsules by mouth once a day.  hydrOXYzine (ATARAX) 25 MG tablet, TAKE 1 TABLET BY MOUTH 3 TIMES DAILY AS NEEDED FOR ANXIETY   linaclotide (LINZESS) 290 MCG CAPS capsule, Take 1 capsule (290 mcg total) by mouth daily before breakfast.   ondansetron (ZOFRAN-ODT) 4 MG disintegrating tablet, Take 1 tablet (4 mg total) by mouth every 8 (eight) hours as needed for nausea or vomiting.   pantoprazole (PROTONIX) 40 MG tablet, Take 1 tablet by mouth 2 times daily before a meal.   Prucalopride Succinate (MOTEGRITY) 2 MG TABS, Take 1 tablet (2 mg total) by mouth daily.   sucralfate (CARAFATE) 1 GM/10ML suspension, Take 10 mLs (1 g total) by mouth 4 (four) times daily.   ondansetron (ZOFRAN-ODT) 8 MG disintegrating tablet, DISSOLVE 1  TABLET BY MOUTH EVERY 8 HOURS AS NEEDED FOR NAUSEA & VOMITING (Patient not taking: Reported on 03/13/2022)  Medical History:  Past Medical History:  Diagnosis Date   Adrenal insufficiency (HCC)    Anemia    Anxiety    Avascular bone necrosis (HCC)    Breast fibrocystic disorder    Chronic pain    COPD (chronic obstructive pulmonary disease) ([place])    Encounter for long-term opiate analgesic use 02/02/2020   Patient has chronic pain with femoral head avascular necrosis.  Is under pain contract   Fibromyalgia    Gastroesophageal reflux disease    Hypothyroidism    Mitral valve prolapse    Orthostatic hypotension    Peripheral neuropathy    PONV (postoperative nausea and vomiting)    Restrictive lung disease    Moderate to severe   Sarcoidosis    Biopsy proven - [facility]   SOB (shortness of breath)    chronic   Allergies:  Allergies  Allergen Reactions   Bee Venom Anaphylaxis   Contrast Media [Iodinated Contrast Media]     CT chest w/ contrast at OSH followed by SOB resolved w/ single dose steroids, never ENT swelling, intubation or pressors, has had multiple contrasted scans since   Lactose Intolerance (Gi)     GI Upset   Bactrim [Sulfamethoxazole-Trimethoprim] Nausea And Vomiting   Codeine Itching   Morphine And Related Other (See Comments)    Unknown reaction- pt gets sick, dizzy, and confused Intolerance, not an allergy, received this in the emergency department recently without a problem.     Surgical History:  She  has a past surgical history that includes Tubal ligation; Breast lumpectomy (Bilateral, 01/12/2011); Esophagogastroduodenoscopy (egd) with propofol (N/A, 10/17/2018); biopsy (10/17/2018); Esophagogastroduodenoscopy (egd) with propofol (N/A, 03/03/2021); Esophageal dilation (N/A, 03/03/2021); biopsy (03/03/2021); Cholecystectomy (N/A, 04/17/2021); Colonoscopy with propofol (N/A, 01/03/2022); and Polypectomy (01/03/2022). Family History:  Her family history includes  Bipolar disorder in her daughter; Breast cancer in her mother; Cardiomyopathy in her mother; Heart attack in her maternal grandfather, maternal grandmother, and paternal grandmother; Heart disease in her maternal aunt; Neurofibromatosis in her cousin; Prostate cancer in her father. Social History:   reports that she has never smoked. She has never been exposed to tobacco smoke. She has never used smokeless tobacco. She reports that she does not drink alcohol and does not use drugs.  REVIEW OF SYSTEMS  : All other systems reviewed and negative except where noted in the History of Present Illness.   PHYSICAL EXAM: BP 106/70 (BP Location: Left Arm, Patient Position: Sitting, Cuff Size: Normal)   Pulse 84   Ht 5' 8.75" (1.746 m) Comment: height measured without shoes  Wt 254 lb 2 oz (115.3 kg)   LMP 05/17/2016  BMI 37.80 kg/m  General:   Morbidly obese female, wheezing on exam, tachypneic, no acute distress Head:   Normocephalic and atraumatic. Eyes:  sclerae anicteric,conjunctive pink  Heart:   regular rate and rhythm, distant heart sounds secondary to body habitus Pulm: Expiratory wheezing on examination, some coarse breath sounds bilateral lower bases, slightly tachypneic Abdomen:   Soft, Obese AB, Hypoactive bowel sounds. mild tenderness in the entire abdomen. Without guarding and Without rebound, No organomegaly appreciated. Rectal: Not evaluated Extremities:  Without edema. Msk: Symmetrical without gross deformities. Peripheral pulses intact.  Neurologic:  Alert and  oriented x4;  No focal deficits.  Skin:   Dry and intact without significant lesions or rashes. Psychiatric:  Cooperative. Normal mood and affect.    Vladimir Crofts, PA-C 3:31 PM

## 2022-03-13 ENCOUNTER — Ambulatory Visit (INDEPENDENT_AMBULATORY_CARE_PROVIDER_SITE_OTHER)
Admission: RE | Admit: 2022-03-13 | Discharge: 2022-03-13 | Disposition: A | Discharge status: Home / Self Care | Payer: 59 | Source: Ambulatory Visit | Attending: Physician Assistant | Admitting: Physician Assistant

## 2022-03-13 ENCOUNTER — Encounter: Payer: Self-pay | Admitting: Physician Assistant

## 2022-03-13 ENCOUNTER — Ambulatory Visit (INDEPENDENT_AMBULATORY_CARE_PROVIDER_SITE_OTHER): Payer: 59 | Admitting: Physician Assistant

## 2022-03-13 VITALS — BP 106/70 | HR 84 | Ht 68.75 in | Wt 254.1 lb

## 2022-03-13 DIAGNOSIS — R109 Unspecified abdominal pain: Secondary | ICD-10-CM | POA: Diagnosis not present

## 2022-03-13 DIAGNOSIS — K5904 Chronic idiopathic constipation: Secondary | ICD-10-CM

## 2022-03-13 DIAGNOSIS — R112 Nausea with vomiting, unspecified: Secondary | ICD-10-CM

## 2022-03-13 DIAGNOSIS — R1010 Upper abdominal pain, unspecified: Secondary | ICD-10-CM | POA: Diagnosis not present

## 2022-03-13 DIAGNOSIS — E2740 Unspecified adrenocortical insufficiency: Secondary | ICD-10-CM | POA: Diagnosis not present

## 2022-03-13 DIAGNOSIS — J849 Interstitial pulmonary disease, unspecified: Secondary | ICD-10-CM

## 2022-03-13 DIAGNOSIS — G8929 Other chronic pain: Secondary | ICD-10-CM | POA: Diagnosis not present

## 2022-03-13 DIAGNOSIS — K5900 Constipation, unspecified: Secondary | ICD-10-CM | POA: Diagnosis not present

## 2022-03-13 MED ORDER — MOTEGRITY 2 MG PO TABS: 2.0000 mg | ORAL_TABLET | Freq: Every day | ORAL | 0 refills | Status: AC

## 2022-03-13 NOTE — Patient Instructions (Addendum)
If you are age 59 or older, your body mass index should be between 23-30. Your Body mass index is 37.8 kg/m. If this is out of the aforementioned range listed, please consider follow up with your Primary Care Provider.  If you are age 76 or younger, your body mass index should be between 19-25. Your Body mass index is 37.8 kg/m. If this is out of the aformentioned range listed, please consider follow up with your Primary Care Provider.   ________________________________________________________  The [facility] providers would like to encourage you to use [facility] to communicate with providers for non-urgent requests or questions.  Due to long hold times on the telephone, sending your provider a message by [facility] may be a faster and more efficient way to get a response.  Please allow 48 business hours for a response.  Please remember that this is for non-urgent requests.  _______________________________________________________  Your provider has requested that you go to the basement level for an Xray of your abdomen before leaving today.  Press "B" on the elevator.  The lab is located at the first door on the left as you exit the elevator.  You have been scheduled for an Upper GI Series at ______. Your appointment is on ______ at ______. Please arrive 15 minutes prior to your test for registration. Make sure not to eat or drink anything after midnight on the night before your test. If you need to reschedule, please call radiology at [phone]. ________________________________________________________________ An upper GI series uses x rays to help diagnose problems of the upper GI tract, which includes the esophagus, stomach, and duodenum. The duodenum is the first part of the small intestine. An upper GI series is conducted by a radiology technologist or a radiologist--a doctor who specializes in x-ray imaging--at a hospital or outpatient center. While sitting or standing in front of an x-ray  machine, the patient drinks barium liquid, which is often white and has a chalky consistency and taste. The barium liquid coats the lining of the upper GI tract and makes signs of disease show up more clearly on x rays. X-ray video, called fluoroscopy, is used to view the barium liquid moving through the esophagus, stomach, and duodenum. Additional x rays and fluoroscopy are performed while the patient lies on an x-ray table. To fully coat the upper GI tract with barium liquid, the technologist or radiologist may press on the abdomen or ask the patient to change position. Patients hold still in various positions, allowing the technologist or radiologist to take x rays of the upper GI tract at different angles. If a technologist conducts the upper GI series, a radiologist will later examine the images to look for problems.  This test typically takes about 1 hour to complete. __________________________________________________________________  Dennis Bast will be contacted by [facility] Scheduling in the next 2 days to arrange a Upper GI Series.  The number on your caller ID will be [phone], please answer when they call.  If you have not heard from them in 2 days please call [phone] to schedule.    WAIT TO TAKE MOTEGRITY UNTIL AFTER XRAY *Call the office and let us know if it seems to work for you and we will send a prescription to your pharmacy.   Follow up pending  Thank you for entrusting me with your care and choosing [facility]  Vicie Mutters, PA-C  Gastroparesis Please do small frequent meals like 4-6 meals a day.  Eat and drink liquids at separate  times.  Avoid high fiber foods, cook your vegetables, avoid high fat food.  Suggest spreading protein throughout the day (greek yogurt, glucerna, soft meat, milk, eggs) Choose soft foods that you can mash with a fork When you are more symptomatic, change to pureed foods foods and liquids.  Consider reading "Living well with  Gastroparesis" by Lambert Keto Gastroparesis is a condition in which food takes longer than normal to empty from the stomach. This condition is also known as delayed gastric emptying. It is usually a long-term (chronic) condition. There is no cure, but there are treatments and things that you can do at home to help relieve symptoms. Treating the underlying condition that causes gastroparesis can also help relieve symptoms What are the causes? In many cases, the cause of this condition is not known. Possible causes include: A hormone (endocrine) disorder, such as hypothyroidism or diabetes. A nervous system disease, such as Parkinson's disease or multiple sclerosis. Cancer, infection, or surgery that affects the stomach or vagus nerve. The vagus nerve runs from your chest, through your neck, and to the lower part of your brain. A connective tissue disorder, such as scleroderma. Certain medicines. What increases the risk? You are more likely to develop this condition if: You have certain disorders or diseases. These may include: An endocrine disorder. An eating disorder. Amyloidosis. Scleroderma. Parkinson's disease. Multiple sclerosis. Cancer or infection of the stomach or the vagus nerve. You have had surgery on your stomach or vagus nerve. You take certain medicines. You are female. What are the signs or symptoms? Symptoms of this condition include: Feeling full after eating very little or a loss of appetite. Nausea, vomiting, or heartburn. Bloating of your abdomen. Inconsistent blood sugar (glucose) levels on blood tests. Unexplained weight loss. Acid from the stomach coming up into the esophagus (gastroesophageal reflux). Sudden tightening (spasm) of the stomach, which can be painful. Symptoms may come and go. Some people may not notice any symptoms. How is this diagnosed? This condition is diagnosed with tests, such as: Tests that check how long it takes food to move  through the stomach and intestines. These tests include: Upper gastrointestinal (GI) series. For this test, you drink a liquid that shows up well on X-rays, and then X-rays are taken of your intestines. Gastric emptying scintigraphy. For this test, you eat food that contains a small amount of radioactive material, and then scans are taken. Wireless capsule GI monitoring system. For this test, you swallow a pill (capsule) that records information about how foods and fluid move through your stomach. Gastric manometry. For this test, a tube is passed down your throat and into your stomach to measure electrical and muscular activity. Endoscopy. For this test, a long, thin tube with a camera and light on the end is passed down your throat and into your stomach to check for problems in your stomach lining. Ultrasound. This test uses sound waves to create images of the inside of your body. This can help rule out gallbladder disease or pancreatitis as a cause of your symptoms. How is this treated? There is no cure for this condition, but treatment and home care may relieve symptoms. Treatment may include: Treating the underlying cause. Managing your symptoms by making changes to your diet and exercise habits. Taking medicines to control nausea and vomiting and to stimulate stomach muscles. Getting food through a feeding tube in the hospital. This may be done in severe cases. Having surgery to insert a device called a gastric electrical stimulator  into your body. This device helps improve stomach emptying and control nausea and vomiting. Follow these instructions at home: Take over-the-counter and prescription medicines only as told by your health care provider. Follow instructions from your health care provider about eating or drinking restrictions. Your health care provider may recommend that you: Eat smaller meals more often. Eat low-fat foods. Eat low-fiber forms of high-fiber foods. For example, eat  cooked vegetables instead of raw vegetables. Have only liquid foods instead of solid foods. Liquid foods are easier to digest. Drink enough fluid to keep your urine pale yellow. Exercise as often as told by your health care provider. Keep all follow-up visits. This is important. Contact a health care provider if you: Notice that your symptoms do not improve with treatment. Have new symptoms. Get help right away if you: Have severe pain in your abdomen that does not improve with treatment. Have nausea that is severe or does not go away. Vomit every time you drink fluids. Summary Gastroparesis is a long-term (chronic) condition in which food takes longer than normal to empty from the stomach. Symptoms include nausea, vomiting, heartburn, bloating of your abdomen, and loss of appetite. Eating smaller portions, low-fat foods, and low-fiber forms of high-fiber foods may help you manage your symptoms. Get help right away if you have severe pain in your abdomen. This information is not intended to replace advice given to you by your health care provider. Make sure you discuss any questions you have with your health care provider. Document Revised: 12/21/2019 Document Reviewed: 12/21/2019 Elsevier Patient Education  2021 [employer].

## 2022-03-15 ENCOUNTER — Ambulatory Visit (HOSPITAL_COMMUNITY)
Admission: RE | Admit: 2022-03-15 | Discharge: 2022-03-15 | Disposition: A | Discharge status: Home / Self Care | Payer: 59 | Source: Ambulatory Visit | Attending: Physician Assistant | Admitting: Physician Assistant

## 2022-03-15 ENCOUNTER — Other Ambulatory Visit: Payer: Self-pay | Admitting: Physician Assistant

## 2022-03-15 DIAGNOSIS — R1010 Upper abdominal pain, unspecified: Secondary | ICD-10-CM | POA: Diagnosis not present

## 2022-03-15 DIAGNOSIS — R112 Nausea with vomiting, unspecified: Secondary | ICD-10-CM | POA: Insufficient documentation

## 2022-03-15 DIAGNOSIS — K5710 Diverticulosis of small intestine without perforation or abscess without bleeding: Secondary | ICD-10-CM | POA: Diagnosis not present

## 2022-03-15 NOTE — Progress Notes (Signed)
Agree with the assessment and plan as thoughtfully outlined by Vicie Mutters, PA-C.  Certainly at elevated risk for opioid induced gastroparesis/constipation and agree with trial of Motegrity, with Movantik in reserve if Motegrity not well covered or not efficacious.  Has otherwise had extensive recent endoscopic evaluation for the same symptoms.  Ardys Hataway, DO, [facility]

## 2022-03-16 DIAGNOSIS — E2740 Unspecified adrenocortical insufficiency: Secondary | ICD-10-CM | POA: Diagnosis not present

## 2022-03-16 DIAGNOSIS — E039 Hypothyroidism, unspecified: Secondary | ICD-10-CM | POA: Diagnosis not present

## 2022-03-17 LAB — COMPREHENSIVE METABOLIC PANEL
ALT: 15 IU/L (ref 0–32)
AST: 19 IU/L (ref 0–40)
Albumin/Globulin Ratio: 1.5 (ref 1.2–2.2)
Albumin: 4 g/dL (ref 3.8–4.9)
Alkaline Phosphatase: 86 IU/L (ref 44–121)
BUN/Creatinine Ratio: 10 (ref 9–23)
BUN: 8 mg/dL (ref 6–24)
Bilirubin Total: <0.2 mg/dL (ref 0.0–1.2)
CO2: 26 mmol/L (ref 20–29)
Calcium: 9.6 mg/dL (ref 8.7–10.2)
Chloride: 102 mmol/L (ref 96–106)
Creatinine, Ser: 0.8 mg/dL (ref 0.57–1.00)
Globulin, Total: 2.6 g/dL (ref 1.5–4.5)
Glucose: 111 mg/dL — ABNORMAL HIGH (ref 70–99)
Potassium: 5.3 mmol/L — ABNORMAL HIGH (ref 3.5–5.2)
Sodium: 140 mmol/L (ref 134–144)
Total Protein: 6.6 g/dL (ref 6.0–8.5)
eGFR: 85 mL/min/{1.73_m2} (ref >59)

## 2022-03-17 LAB — T4, FREE: Free T4: 1.08 ng/dL (ref 0.82–1.77)

## 2022-03-17 LAB — TSH: TSH: 0.668 u[IU]/mL (ref 0.450–4.500)

## 2022-03-18 DIAGNOSIS — J9601 Acute respiratory failure with hypoxia: Secondary | ICD-10-CM | POA: Diagnosis not present

## 2022-03-18 DIAGNOSIS — M6281 Muscle weakness (generalized): Secondary | ICD-10-CM | POA: Diagnosis not present

## 2022-03-18 DIAGNOSIS — J849 Interstitial pulmonary disease, unspecified: Secondary | ICD-10-CM | POA: Diagnosis not present

## 2022-03-18 DIAGNOSIS — D860 Sarcoidosis of lung: Secondary | ICD-10-CM | POA: Diagnosis not present

## 2022-03-19 ENCOUNTER — Other Ambulatory Visit (HOSPITAL_COMMUNITY): Payer: Self-pay

## 2022-03-20 ENCOUNTER — Telehealth: Payer: Self-pay | Admitting: Physician Assistant

## 2022-03-20 ENCOUNTER — Ambulatory Visit (INDEPENDENT_AMBULATORY_CARE_PROVIDER_SITE_OTHER): Payer: 59 | Admitting: "Endocrinology

## 2022-03-20 ENCOUNTER — Encounter: Payer: Self-pay | Admitting: "Endocrinology

## 2022-03-20 ENCOUNTER — Other Ambulatory Visit (HOSPITAL_COMMUNITY): Payer: Self-pay

## 2022-03-20 VITALS — BP 124/66 | HR 92 | Ht 68.75 in | Wt 256.2 lb

## 2022-03-20 DIAGNOSIS — E2740 Unspecified adrenocortical insufficiency: Secondary | ICD-10-CM

## 2022-03-20 DIAGNOSIS — E782 Mixed hyperlipidemia: Secondary | ICD-10-CM | POA: Diagnosis not present

## 2022-03-20 DIAGNOSIS — E039 Hypothyroidism, unspecified: Secondary | ICD-10-CM

## 2022-03-20 DIAGNOSIS — Z6838 Body mass index (BMI) 38.0-38.9, adult: Secondary | ICD-10-CM | POA: Diagnosis not present

## 2022-03-20 NOTE — Telephone Encounter (Signed)
Returned patient's call & informed her of results from both recent imaging results. Pt verbalized all understanding.

## 2022-03-20 NOTE — Telephone Encounter (Signed)
Patient called requesting to speak with a nurse regarding her imaging result.

## 2022-03-20 NOTE — Progress Notes (Signed)
03/20/2022, 8:17 PM     Endocrinology follow-up note  Subjective:    Patient ID: Kathleen [name], female    DOB: 03/16/1961, PCP [name], MD   Past Medical History:  Diagnosis Date   Adrenal insufficiency ([place])    Anemia    Anxiety    Avascular bone necrosis ([place])    Breast fibrocystic disorder    Chronic pain    COPD (chronic obstructive pulmonary disease) ([place])    Encounter for long-term opiate analgesic use 02/02/2020   Patient has chronic pain with femoral head avascular necrosis.  Is under pain contract   Fibromyalgia    Gastroesophageal reflux disease    Hypothyroidism    Mitral valve prolapse    Orthostatic hypotension    Peripheral neuropathy    PONV (postoperative nausea and vomiting)    Restrictive lung disease    Moderate to severe   Sarcoidosis    Biopsy proven - [facility]   SOB (shortness of breath)    chronic   Past Surgical History:  Procedure Laterality Date   BIOPSY  10/17/2018   Procedure: BIOPSY;  Surgeon: Rogene Houston, MD;  [facility];  Service: Endoscopy;;  duodenum, antrum, gastric body   BIOPSY  03/03/2021   Procedure: BIOPSY;  Surgeon: Rogene Houston, MD;  [facility];  Service: Endoscopy;;   BREAST LUMPECTOMY Bilateral 01/12/2011   CHOLECYSTECTOMY N/A 04/17/2021   Procedure: LAPAROSCOPIC CHOLECYSTECTOMY;  Surgeon: Aviva Signs, MD;  Location: [facility];  Service: General;  Laterality: N/A;   COLONOSCOPY WITH PROPOFOL N/A 01/03/2022   Procedure: COLONOSCOPY WITH PROPOFOL;  Surgeon: Rogene Houston, MD;  [facility];  Service: Endoscopy;  Laterality: N/A;  220   ESOPHAGEAL DILATION N/A 03/03/2021   Procedure: ESOPHAGEAL DILATION;  Surgeon: Rogene Houston, MD;  [facility];  Service: Endoscopy;  Laterality: N/A;   ESOPHAGOGASTRODUODENOSCOPY (EGD) WITH PROPOFOL N/A 10/17/2018   Procedure: ESOPHAGOGASTRODUODENOSCOPY (EGD) WITH PROPOFOL;   Surgeon: Rogene Houston, MD;  [facility];  Service: Endoscopy;  Laterality: N/A;  2:25   ESOPHAGOGASTRODUODENOSCOPY (EGD) WITH PROPOFOL N/A 03/03/2021   Procedure: ESOPHAGOGASTRODUODENOSCOPY (EGD) WITH PROPOFOL;  Surgeon: Rogene Houston, MD;  [facility];  Service: Endoscopy;  Laterality: N/A;  12:15   POLYPECTOMY  01/03/2022   Procedure: POLYPECTOMY INTESTINAL;  Surgeon: Rogene Houston, MD;  [facility];  Service: Endoscopy;;   TUBAL LIGATION     Social History   Socioeconomic History   Marital status: Married    Spouse name: Not on file   Number of children: 1   Years of education: Not on file   Highest education level: Not on file  Occupational History   Occupation: Accounts Payable    Employer: [employer]  Tobacco Use   Smoking status: Never    Passive exposure: Never   Smokeless tobacco: Never  Vaping Use   Vaping Use: Never used  Substance and Sexual Activity   Alcohol use: No   Drug use: No   Sexual activity: Yes    Birth control/protection: Post-menopausal  Other Topics Concern   Not on file  Social History Narrative   Daily caffeine  Living with husband   Right handed   She has been on disability since 2014.    Social Determinants of Health   Financial Resource Strain: Not on file  Food Insecurity: Not on file  Transportation Needs: Not on file  Physical Activity: Not on file  Stress: Not on file  Social Connections: Not on file   Family History  Problem Relation Age of Onset   Cardiomyopathy Mother    Breast cancer Mother    Prostate cancer Father    Heart attack Maternal Grandmother    Heart attack Maternal Grandfather    Heart attack Paternal Grandmother    Bipolar disorder Daughter    Heart disease Maternal Aunt    Neurofibromatosis Cousin    Colon cancer Neg Hx    Outpatient Encounter Medications as of 03/20/2022  Medication Sig   acetaminophen (TYLENOL) 500 MG tablet Take 1,000 mg by mouth every 8  (eight) hours as needed for headache.   albuterol (VENTOLIN HFA) 108 (90 Base) MCG/ACT inhaler Inhale 2 puffs into the lungs every 6 (six) hours as needed for wheezing or shortness of breath.   ascorbic acid (VITAMIN C) 500 MG tablet Take 1 tablet (500 mg total) by mouth daily.   budesonide (PULMICORT) 0.5 MG/2ML nebulizer solution Inhale 2 mL (0.5 mg total) by nebulization two (2) times a day as needed. (Patient not taking: Reported on 01/05/2022)   budesonide (PULMICORT) 0.5 MG/2ML nebulizer solution Inhale the contents of 1 vial via nebulizer two (2) times a day.   EPINEPHrine 0.3 mg/0.3 mL IJ SOAJ injection Inject 0.3 mg into the muscle as needed for anaphylaxis. (Patient not taking: Reported on 03/13/2022)   ferrous sulfate 325 (65 FE) MG EC tablet Take 325 mg by mouth as needed.   FLUoxetine (PROZAC) 20 MG capsule Take 3 capsules by mouth once a day.   formoterol (PERFOROMIST) 20 MCG/2ML nebulizer solution Inhale 1 vial via nebulizer two times a day. (Patient not taking: Reported on 01/05/2022)   formoterol (PERFOROMIST) 20 MCG/2ML nebulizer solution Inhale the contents of 1 vial via nebulizer two (2) times a day.   hydrocortisone (CORTEF) 10 MG tablet Take 2 tablets by mouth at 8:00 am and 1 tablet at lunch. (May double dose on sick days x 3-5 days)   hydrOXYzine (ATARAX) 25 MG tablet TAKE 1 TABLET BY MOUTH 3 TIMES DAILY AS NEEDED FOR ANXIETY   levothyroxine (SYNTHROID) 50 MCG tablet Take 1 tablet by mouth daily before breakfast.   linaclotide (LINZESS) 290 MCG CAPS capsule Take 1 capsule (290 mcg total) by mouth daily before breakfast.   midodrine (PROAMATINE) 5 MG tablet TAKE 1 TABLET BY MOUTH TWICE DAILY WITH A MEAL   ondansetron (ZOFRAN-ODT) 4 MG disintegrating tablet Take 1 tablet (4 mg total) by mouth every 8 (eight) hours as needed for nausea or vomiting.   ondansetron (ZOFRAN-ODT) 8 MG disintegrating tablet DISSOLVE 1 TABLET BY MOUTH EVERY 8 HOURS AS NEEDED FOR NAUSEA & VOMITING (Patient  not taking: Reported on 03/13/2022)   Oxycodone HCl 10 MG TABS Take 1 tablet by mouth every 4 hours as needed for pain (max 6 tablets daily).   Oxycodone HCl 10 MG TABS Take 1 tablet by mouth every 4 hours as needed for pain (max 6 tablets daily). (Patient not taking: Reported on 03/13/2022)   Oxycodone HCl 10 MG TABS Take 1 tablet by mouth every 4 hours as needed for pain (max 6 tablets daily). (Patient not taking: Reported on 03/13/2022)   pantoprazole (  PROTONIX) 40 MG tablet Take 1 tablet by mouth 2 times daily before a meal.   predniSONE (DELTASONE) 5 MG tablet Take 1 tablet (5 mg total) by mouth in the morning.   Prucalopride Succinate (MOTEGRITY) 2 MG TABS Take 1 tablet (2 mg total) by mouth daily.   sucralfate (CARAFATE) 1 GM/10ML suspension Take 10 mLs (1 g total) by mouth 4 (four) times daily.   No facility-administered encounter medications on file as of 03/20/2022.   ALLERGIES: Allergies  Allergen Reactions   Bee Venom Anaphylaxis   Contrast Media [Iodinated Contrast Media]     CT chest w/ contrast at OSH followed by SOB resolved w/ single dose steroids, never ENT swelling, intubation or pressors, has had multiple contrasted scans since   Lactose Intolerance (Gi)     GI Upset   Bactrim [Sulfamethoxazole-Trimethoprim] Nausea And Vomiting   Codeine Itching   Morphine And Related Other (See Comments)    Unknown reaction- pt gets sick, dizzy, and confused Intolerance, not an allergy, received this in the emergency department recently without a problem.    VACCINATION STATUS: Immunization History  Administered Date(s) Administered   Influenza Whole 06/27/2010, 07/28/2011   Moderna Sars-Covid-2 Vaccination 10/27/2019, 11/27/2019   Pneumococcal Conjugate-13 06/15/2014   Pneumococcal Polysaccharide-23 06/10/2013    HPI Ellyanna R Dwyer is 61 y.o. female who is being seen in follow-up in the management of adrenal insufficiency, hypothyroidism.  -She is on a stable dose of steroids  including hydrocortisone 20 mg p.o. daily in the morning, 10 mg p.o. daily at noon.    She is currently on levothyroxine 50 mcg p.o. daily before breakfast. She is returning for follow-up.  She has no new complaints today.  Her previsit thyroid function tests are consistent with appropriate replacement.  -Patient largely avoided ER visits in recent month. - Her history is complicated including sarcoidosis which she dealt with for more than the last 10 years.  She is on regular follow-up with her pulmonologist.  Over the years, her therapy included high-dose steroids, up to 60 mg daily of prednisone, for the last 4-6 years.  She is currently on maintenance dose of prednisone 10 mg p.o. daily.  For steroids induced adrenal  insufficiency, she was initiated and kept on hydrocortisone. She is currently on 20 mg p.o. every morning and 10 mg p.o. daily every noon.     -She reports better consistency taking her medications this time.  She also has hypothyroidism on levothyroxine 75 mcg p.o. every morning.   -She has significant cognitive deficit.  - The details of her diagnosis with adrenal insufficiency  is not available to review. -She is not wearing her medic alert today.  . She denies any history of tuberculosis, incarceration, nor overseas travel . -She denies family history of adrenal, pituitary dysfunction. She has family history of hypothyroidism in one sibling. - Denies any history of intra-abdominal injury. She is on hydrocodone for chronic pain and inhaled bronchodilators. She is known to have orthostatic hypotension for which she is on midodrine.     Review of Systems Limited as above.  Objective:    BP 124/66   Pulse 92   Ht 5' 8.75" (1.746 m)   Wt 256 lb 3.2 oz (116.2 kg)   LMP 05/17/2016   BMI 38.11 kg/m   Wt Readings from Last 3 Encounters:  03/20/22 256 lb 3.2 oz (116.2 kg)  03/13/22 254 lb 2 oz (115.3 kg)  03/02/22 220 lb (99.8 kg)    Physical  Exam    CMP ( most  recent) CMP     Component Value Date/Time   NA 140 03/16/2022 0857   K 5.3 (H) 03/16/2022 0857   CL 102 03/16/2022 0857   CO2 26 03/16/2022 0857   GLUCOSE 111 (H) 03/16/2022 0857   GLUCOSE 109 (H) 03/02/2022 1539   BUN 8 03/16/2022 0857   CREATININE 0.80 03/16/2022 0857   CREATININE 0.85 08/26/2014 0831   CALCIUM 9.6 03/16/2022 0857   PROT 6.6 03/16/2022 0857   ALBUMIN 4.0 03/16/2022 0857   AST 19 03/16/2022 0857   ALT 15 03/16/2022 0857   ALKPHOS 86 03/16/2022 0857   BILITOT <0.2 03/16/2022 0857   GFRNONAA >60 03/02/2022 1539   GFRAA 92 05/24/2020 1038     Diabetic Labs (most recent): Lab Results  Component Value Date   HGBA1C 5.4 08/04/2021   HGBA1C 5.4 02/03/2019   HGBA1C 5.0 12/31/2016     Lipid Panel ( most recent) Lipid Panel     Component Value Date/Time   CHOL 253 (H) 08/04/2021 0826   TRIG 255 (H) 08/04/2021 0826   HDL 61 08/04/2021 0826   CHOLHDL 4.1 08/04/2021 0826   CHOLHDL 4.0 08/26/2014 0831   VLDL 31 08/26/2014 0831   LDLCALC 146 (H) 08/04/2021 0826      Lab Results  Component Value Date   TSH 0.668 03/16/2022   TSH 0.088 (L) 09/14/2021   TSH 0.668 05/08/2021   TSH 0.307 (L) 11/09/2020   TSH 0.548 05/24/2020   TSH 0.689 11/20/2019   TSH 0.364 (L) 06/29/2019   TSH 0.746 02/03/2019   TSH 1.290 08/07/2018   TSH 2.280 04/17/2018   FREET4 1.08 03/16/2022   FREET4 1.71 09/14/2021   FREET4 1.06 05/08/2021   FREET4 1.32 11/09/2020   FREET4 1.46 05/24/2020   FREET4 1.25 11/20/2019   FREET4 1.43 06/29/2019   FREET4 1.32 02/03/2019   FREET4 1.38 08/09/2017   FREET4 1.15 04/19/2017      Assessment & Plan:   1. Adrenal insufficiency ([place]) -I have reviewed her new set of labs, available records and clinically evaluated this patient. She does have multiple risk factors for adrenal insufficiency  including exposure to high-dose steroids related to her sarcoidosis, as well as opioid therapy related to diffuse pain. -  She has taken prednisone  up to 60 mg daily for up to 6 years prior to her diagnosis with adrenal insufficiency. -Her SPO2 is 94%, 5 to 6% deficit could be a reason for her fatigue.  She will not need any higher dose of steroids than what she takes. -She is on a stable dose of hydrocortisone 20 mg p.o. a.m., 10 mg p.o. daily at noon,    in addition to her prednisone 10 mg p.o. daily continued related to her pulmonary sarcoidosis.  She did not tolerate an attempt to withdraw her prednisone.     I had a lengthy discussion with her about the need to avoid any further unnecessary escalation on steroid replacement.    She is advised to remain on same steroid replacement dose and schedule.   -She will not be considered for work-up by withdrawing steroids supports,  risk of adrenal crisis.   -She does not have enough support to perform steroid injection at home. -She is advised to go to ER if she cannot keep her medications down due to intractable vomiting. - She will be evaluated for the need for mineralocorticoid replacement which may help to stabilize her blood pressure. She is  currently on medodrine 5 mg by mouth twice a day, maintaining her blood pressure within normal range. - I had a long discussion with her regarding steroids sick day rules, risk of unintended weight gain/diabetes and the need for periodic screening with A1c.  -Her last A1c was 5.4% in June 2020.  She will have point-of-care A1c during her next visit.      2. Hypothyroidism:  -Her recent labs for thyroid function are consistent with appropriate replacement.  She is advised to continue  levothyroxine to 50 mcg p.o. daily before breakfast.    - We discussed about the correct intake of her thyroid hormone, on empty stomach at fasting, with water, separated by at least 30 minutes from breakfast and other medications,  and separated by more than 4 hours from calcium, iron, multivitamins, acid reflux medications (PPIs). -Patient is made aware of the fact  that thyroid hormone replacement is needed for life, dose to be adjusted by periodic monitoring of thyroid function tests.   She has severe hyperlipidemia, does not want to go on statins due to her diffuse pain syndrome.  Is a good candidate for lifestyle medicine in light of her obesity, hyperlipidemia, hypertension. - she acknowledges that there is a room for improvement in her food and drink choices. - Suggestion is made for her to avoid simple carbohydrates  from her diet including Cakes, Sweet Desserts, Ice Cream, Soda (diet and regular), Sweet Tea, Candies, Chips, Cookies, Store Bought Juices, Alcohol , Artificial Sweeteners,  Coffee Creamer, and "Sugar-free" Products, Lemonade. This will help patient to have more stable blood glucose profile and potentially avoid unintended weight gain.  The following Lifestyle Medicine recommendations according to [place]  [facility]) were discussed and and offered to patient and she  agrees to start the journey:  A. Whole Foods, Plant-Based Nutrition comprising of fruits and vegetables, plant-based proteins, whole-grain carbohydrates was discussed in detail with the patient.   A list for source of those nutrients were also provided to the patient.  Patient will use only water or unsweetened tea for hydration. B.  The need to stay away from risky substances including alcohol, smoking; obtaining 7 to 9 hours of restorative sleep, at least 150 minutes of moderate intensity exercise weekly, the importance of healthy social connections,  and stress management techniques were discussed. C.  A full color page of  Calorie density of various food groups per pound showing examples of each food groups was provided to the patient.    - I advised her  to maintain close follow up with [name], MD for primary care needs.   I spent 32 minutes in the care of the patient today including review of labs from Thyroid Function, CMP, and other  relevant labs ; imaging/biopsy records (current and previous including abstractions from other facilities); face-to-face time discussing  her lab results and symptoms, medications doses, her options of short and long term treatment based on the latest standards of care / guidelines;   and documenting the encounter.  [name]  participated in the discussions, expressed understanding, and voiced agreement with the above plans.  All questions were answered to her satisfaction. she is encouraged to contact clinic should she have any questions or concerns prior to her return visit.    Follow up plan: Return in about 6 months (around 09/20/2022) for F/U with Pre-visit Labs, A1c -NV.   Glade Lloyd, MD [facility] [phone]  [address] [place], [place] [address] Phone: [phone]  Fax: [phone]     03/20/2022, 8:17 PM  This note was partially dictated with voice recognition software. Similar sounding words can be transcribed inadequately or may not  be corrected upon review.

## 2022-03-21 ENCOUNTER — Telehealth: Payer: Self-pay | Admitting: "Endocrinology

## 2022-03-21 DIAGNOSIS — D860 Sarcoidosis of lung: Secondary | ICD-10-CM | POA: Diagnosis not present

## 2022-03-21 DIAGNOSIS — J9601 Acute respiratory failure with hypoxia: Secondary | ICD-10-CM | POA: Diagnosis not present

## 2022-03-21 DIAGNOSIS — J849 Interstitial pulmonary disease, unspecified: Secondary | ICD-10-CM | POA: Diagnosis not present

## 2022-03-21 DIAGNOSIS — M6281 Muscle weakness (generalized): Secondary | ICD-10-CM | POA: Diagnosis not present

## 2022-03-21 NOTE — Telephone Encounter (Signed)
Sent results of pt's last lipid panel in December to her through [facility].

## 2022-03-21 NOTE — Telephone Encounter (Addendum)
Pt left a VM stating that Dr Dorris Fetch told her at her visit yesterday that her cholesterol was high, she would like to know what that # is. Can you call her back or just send her mychart message?

## 2022-03-28 ENCOUNTER — Other Ambulatory Visit (HOSPITAL_COMMUNITY): Payer: Self-pay

## 2022-03-28 ENCOUNTER — Encounter: Payer: Self-pay | Admitting: Family Medicine

## 2022-03-28 ENCOUNTER — Ambulatory Visit (INDEPENDENT_AMBULATORY_CARE_PROVIDER_SITE_OTHER): Payer: 59 | Admitting: Family Medicine

## 2022-03-28 VITALS — BP 126/78 | HR 69 | Wt 249.8 lb

## 2022-03-28 DIAGNOSIS — Z79891 Long term (current) use of opiate analgesic: Secondary | ICD-10-CM | POA: Diagnosis not present

## 2022-03-28 DIAGNOSIS — Z79899 Other long term (current) drug therapy: Secondary | ICD-10-CM | POA: Diagnosis not present

## 2022-03-28 DIAGNOSIS — E785 Hyperlipidemia, unspecified: Secondary | ICD-10-CM | POA: Diagnosis not present

## 2022-03-28 DIAGNOSIS — I700 Atherosclerosis of aorta: Secondary | ICD-10-CM

## 2022-03-28 MED ORDER — OXYCODONE HCL 10 MG PO TABS
ORAL_TABLET | ORAL | 0 refills | Status: DC
  Filled 2022-03-28: qty 180, fill #0
  Filled 2022-06-18 (×2): qty 180, 30d supply, fill #0

## 2022-03-28 MED ORDER — OXYCODONE HCL 10 MG PO TABS
ORAL_TABLET | ORAL | 0 refills | Status: DC
  Filled 2022-03-28: qty 180, fill #0
  Filled 2022-05-18: qty 180, 30d supply, fill #0

## 2022-03-28 MED ORDER — OXYCODONE HCL 10 MG PO TABS
ORAL_TABLET | ORAL | 0 refills | Status: DC
  Filled 2022-03-28: qty 180, fill #0
  Filled 2022-04-18: qty 180, 30d supply, fill #0

## 2022-03-28 MED ORDER — LINACLOTIDE 290 MCG PO CAPS
290.0000 ug | ORAL_CAPSULE | Freq: Every day | ORAL | 5 refills | Status: DC
  Filled 2022-03-28: qty 30, 30d supply, fill #0

## 2022-03-28 MED ORDER — ROSUVASTATIN CALCIUM 10 MG PO TABS
10.0000 mg | ORAL_TABLET | Freq: Every day | ORAL | 1 refills | Status: DC
  Filled 2022-03-28: qty 90, 90d supply, fill #0
  Filled 2022-06-27: qty 90, 90d supply, fill #1

## 2022-03-28 NOTE — Progress Notes (Signed)
Subjective:    Patient ID: Kathleen [name], female    DOB: 1961/04/15, 61 y.o.   MRN: [id number]  HPI This patient was seen today for chronic pain  The medication list was reviewed and updated.  Location of Pain for which the patient has been treated with regarding narcotics:   Onset of this pain:    -Compliance with medication:Oxycodone 10 mg  - Number patient states they take daily: 5-6 (q 4 hrs)  -when was the last dose patient took? This morning   The patient was advised the importance of maintaining medication and not using illegal substances with these.  Here for refills and follow up  The patient was educated that we can provide 3 monthly scripts for their medication, it is their responsibility to follow the instructions.  Side effects or complications from medications: none  Patient is aware that pain medications are meant to minimize the severity of the pain to allow their pain levels to improve to allow for better function. They are aware of that pain medications cannot totally remove their pain.  Due for UDT ( at least once per year) : completed 09/2021  Scale of 1 to 10 ( 1 is least 10 is most) Your pain level without the medicine: 10 Your pain level with medication: 7  Scale 1 to 10 ( 1-helps very little, 10 helps very well) How well does your pain medication reduce your pain so you can function better through out the day? 10  Quality of the pain: She describes aching pain in her back as well as hips  Persistence of the pain: Present all the time  Modifying factors: Worse with activity Aortic atherosclerosis (HCC)  Hyperlipidemia, unspecified hyperlipidemia type - Plan: Lipid panel, Hepatic function panel  Encounter for long-term opiate analgesic use  High risk medication use - Plan: Lipid panel, Hepatic function panel          Review of Systems     Objective:   Physical Exam  General-in no acute distress Eyes-no discharge Lungs-respiratory  rate normal, CTA CV-no murmurs,RRR Extremities skin warm dry no edema Neuro grossly normal Behavior normal, alert       Assessment & Plan:  1. Aortic atherosclerosis (HCC) Very important to go ahead and start a statin This was seen on her CAT scan Recommend Crestor 10 mg daily Goal is to get LDL below 70 She is also doing plant-based diet which is excellent She needs to continue this Plant-based alone is not adequate to treat that aortic atherosclerosis but is a excellent thing for her to do  2. Hyperlipidemia, unspecified hyperlipidemia type We will recheck lipid liver in approximately 8 to 10 weeks - Lipid panel - Hepatic function panel  3. Encounter for long-term opiate analgesic use Patient does benefit from pain medicine Allows her to function better She does not abuse it Refills given The patient was seen in followup for chronic pain. A review over at their current pain status was discussed. Drug registry was checked. Prescriptions were given.  Regular follow-up recommended. Discussion was held regarding the importance of compliance with medication as well as pain medication contract.  Patient was informed that medication may cause drowsiness and should not be combined  with other medications/alcohol or street drugs. If the patient feels medication is causing altered alertness then do not drive or operate dangerous equipment.  Should be noted that the patient appears to be meeting appropriate use of opioids and response.  Evidenced by improved function  and decent pain control without significant side effects and no evidence of overt aberrancy issues.  Upon discussion with the patient today they understand that opioid therapy is optional and they feel that the pain has been refractory to reasonable conservative measures and is significant and affecting quality of life enough to warrant ongoing therapy and wishes to continue opioids.  Refills were provided.   4. High risk  medication use Labs - Lipid panel - Hepatic function panel Follow-up 3 months

## 2022-04-04 DIAGNOSIS — M6281 Muscle weakness (generalized): Secondary | ICD-10-CM | POA: Diagnosis not present

## 2022-04-04 DIAGNOSIS — D860 Sarcoidosis of lung: Secondary | ICD-10-CM | POA: Diagnosis not present

## 2022-04-04 DIAGNOSIS — J849 Interstitial pulmonary disease, unspecified: Secondary | ICD-10-CM | POA: Diagnosis not present

## 2022-04-04 DIAGNOSIS — J9601 Acute respiratory failure with hypoxia: Secondary | ICD-10-CM | POA: Diagnosis not present

## 2022-04-06 ENCOUNTER — Other Ambulatory Visit (HOSPITAL_COMMUNITY): Payer: Self-pay

## 2022-04-09 ENCOUNTER — Other Ambulatory Visit (HOSPITAL_COMMUNITY): Payer: Self-pay

## 2022-04-16 ENCOUNTER — Other Ambulatory Visit (HOSPITAL_COMMUNITY): Payer: Self-pay

## 2022-04-18 ENCOUNTER — Other Ambulatory Visit (HOSPITAL_COMMUNITY): Payer: Self-pay

## 2022-04-18 DIAGNOSIS — M6281 Muscle weakness (generalized): Secondary | ICD-10-CM | POA: Diagnosis not present

## 2022-04-18 DIAGNOSIS — J9601 Acute respiratory failure with hypoxia: Secondary | ICD-10-CM | POA: Diagnosis not present

## 2022-04-18 DIAGNOSIS — J849 Interstitial pulmonary disease, unspecified: Secondary | ICD-10-CM | POA: Diagnosis not present

## 2022-04-18 DIAGNOSIS — D860 Sarcoidosis of lung: Secondary | ICD-10-CM | POA: Diagnosis not present

## 2022-04-21 DIAGNOSIS — J9601 Acute respiratory failure with hypoxia: Secondary | ICD-10-CM | POA: Diagnosis not present

## 2022-04-21 DIAGNOSIS — M6281 Muscle weakness (generalized): Secondary | ICD-10-CM | POA: Diagnosis not present

## 2022-04-21 DIAGNOSIS — D860 Sarcoidosis of lung: Secondary | ICD-10-CM | POA: Diagnosis not present

## 2022-04-21 DIAGNOSIS — J849 Interstitial pulmonary disease, unspecified: Secondary | ICD-10-CM | POA: Diagnosis not present

## 2022-05-04 ENCOUNTER — Other Ambulatory Visit (HOSPITAL_COMMUNITY): Payer: Self-pay

## 2022-05-08 ENCOUNTER — Other Ambulatory Visit: Payer: Self-pay | Admitting: Family Medicine

## 2022-05-08 ENCOUNTER — Other Ambulatory Visit (HOSPITAL_COMMUNITY): Payer: Self-pay

## 2022-05-09 ENCOUNTER — Other Ambulatory Visit (HOSPITAL_COMMUNITY): Payer: Self-pay

## 2022-05-09 MED ORDER — HYDROXYZINE HCL 25 MG PO TABS
ORAL_TABLET | Freq: Three times a day (TID) | ORAL | 5 refills | Status: DC | PRN
  Filled 2022-05-09: qty 90, 30d supply, fill #0
  Filled 2022-06-08: qty 90, 30d supply, fill #1
  Filled 2022-07-04: qty 90, 30d supply, fill #2
  Filled 2022-07-27: qty 90, 30d supply, fill #3
  Filled 2022-08-21: qty 90, 30d supply, fill #4
  Filled 2022-09-13: qty 90, 30d supply, fill #5

## 2022-05-10 ENCOUNTER — Other Ambulatory Visit (HOSPITAL_COMMUNITY): Payer: Self-pay

## 2022-05-14 ENCOUNTER — Other Ambulatory Visit (HOSPITAL_COMMUNITY): Payer: Self-pay

## 2022-05-18 ENCOUNTER — Other Ambulatory Visit (HOSPITAL_COMMUNITY): Payer: Self-pay

## 2022-05-19 DIAGNOSIS — D860 Sarcoidosis of lung: Secondary | ICD-10-CM | POA: Diagnosis not present

## 2022-05-19 DIAGNOSIS — M6281 Muscle weakness (generalized): Secondary | ICD-10-CM | POA: Diagnosis not present

## 2022-05-19 DIAGNOSIS — J9601 Acute respiratory failure with hypoxia: Secondary | ICD-10-CM | POA: Diagnosis not present

## 2022-05-19 DIAGNOSIS — J849 Interstitial pulmonary disease, unspecified: Secondary | ICD-10-CM | POA: Diagnosis not present

## 2022-05-22 DIAGNOSIS — J849 Interstitial pulmonary disease, unspecified: Secondary | ICD-10-CM | POA: Diagnosis not present

## 2022-05-22 DIAGNOSIS — J9601 Acute respiratory failure with hypoxia: Secondary | ICD-10-CM | POA: Diagnosis not present

## 2022-05-22 DIAGNOSIS — M6281 Muscle weakness (generalized): Secondary | ICD-10-CM | POA: Diagnosis not present

## 2022-05-22 DIAGNOSIS — D860 Sarcoidosis of lung: Secondary | ICD-10-CM | POA: Diagnosis not present

## 2022-06-08 ENCOUNTER — Other Ambulatory Visit (HOSPITAL_COMMUNITY): Payer: Self-pay

## 2022-06-11 ENCOUNTER — Other Ambulatory Visit (HOSPITAL_COMMUNITY): Payer: Self-pay

## 2022-06-18 ENCOUNTER — Other Ambulatory Visit (HOSPITAL_COMMUNITY): Payer: Self-pay

## 2022-06-18 ENCOUNTER — Other Ambulatory Visit: Payer: Self-pay | Admitting: Family Medicine

## 2022-06-18 MED ORDER — OXYCODONE HCL 10 MG PO TABS
ORAL_TABLET | ORAL | 0 refills | Status: DC
  Filled 2022-06-18: qty 180, 30d supply, fill #0

## 2022-06-18 NOTE — Telephone Encounter (Signed)
Patient is requesting her pain medication be sent to [facility] due to [facility] . Oxycodone 10 mg

## 2022-06-19 ENCOUNTER — Other Ambulatory Visit (HOSPITAL_COMMUNITY): Payer: Self-pay

## 2022-06-27 ENCOUNTER — Other Ambulatory Visit (HOSPITAL_COMMUNITY): Payer: Self-pay

## 2022-06-28 ENCOUNTER — Ambulatory Visit: Payer: 59 | Admitting: Family Medicine

## 2022-07-02 ENCOUNTER — Telehealth: Payer: Self-pay | Admitting: Family Medicine

## 2022-07-02 ENCOUNTER — Other Ambulatory Visit: Payer: Self-pay

## 2022-07-02 MED ORDER — DOXYCYCLINE HYCLATE 100 MG PO TABS: 100.0000 mg | ORAL_TABLET | Freq: Two times a day (BID) | ORAL | 0 refills | Status: AC

## 2022-07-02 NOTE — Telephone Encounter (Signed)
Patient is requesting a refill for antibiotics for underarm abscess, she does have an appt tomorrow for pain management, please advise- [facility] [address].

## 2022-07-02 NOTE — Telephone Encounter (Signed)
Doxycycline 100 mg 1 taken twice daily for 10 days recommend follow-up office visit if worsening troubles  Recommend warm compresses 20 minutes at a time several times per day Keep follow-up visit on the seventh

## 2022-07-02 NOTE — Telephone Encounter (Signed)
Patient made aware per drs orders and recommendations.

## 2022-07-02 NOTE — Telephone Encounter (Signed)
Patient has abscess under her arm and and needing doxycycline called into [facility] last filled 10/21/18

## 2022-07-03 ENCOUNTER — Telehealth: Payer: Self-pay | Admitting: Family Medicine

## 2022-07-03 ENCOUNTER — Other Ambulatory Visit (HOSPITAL_COMMUNITY): Payer: Self-pay

## 2022-07-03 ENCOUNTER — Encounter: Payer: Self-pay | Admitting: Family Medicine

## 2022-07-03 ENCOUNTER — Ambulatory Visit (INDEPENDENT_AMBULATORY_CARE_PROVIDER_SITE_OTHER): Payer: Medicare Other | Admitting: Family Medicine

## 2022-07-03 VITALS — BP 138/76 | Wt 255.0 lb

## 2022-07-03 DIAGNOSIS — L02411 Cutaneous abscess of right axilla: Secondary | ICD-10-CM | POA: Diagnosis not present

## 2022-07-03 DIAGNOSIS — M87052 Idiopathic aseptic necrosis of left femur: Secondary | ICD-10-CM

## 2022-07-03 DIAGNOSIS — E785 Hyperlipidemia, unspecified: Secondary | ICD-10-CM | POA: Diagnosis not present

## 2022-07-03 DIAGNOSIS — Z79899 Other long term (current) drug therapy: Secondary | ICD-10-CM

## 2022-07-03 MED ORDER — ROSUVASTATIN CALCIUM 10 MG PO TABS
10.0000 mg | ORAL_TABLET | Freq: Every day | ORAL | 1 refills | Status: DC
  Filled 2022-07-03 – 2022-09-20 (×2): qty 90, 90d supply, fill #0
  Filled 2022-12-24: qty 90, 90d supply, fill #1

## 2022-07-03 NOTE — Telephone Encounter (Signed)
Contacted [facility]-last time she filled pain med there was 06/19/22. Pharmacy states they are having issues with PDM today and unsure if she has gotten a refill any where else. Please advise. Thank you

## 2022-07-03 NOTE — Progress Notes (Unsigned)
Subjective:    Patient ID: Kathleen [name], female    DOB: [birth year], 61 y.o.   MRN: [id number]  HPI This patient was seen today for chronic pain  The medication list was reviewed and updated.  Location of Pain for which the patient has been treated with regarding narcotics:   Onset of this pain:    -Compliance with medication: Oxycodone 10 mg  - Number patient states they take daily: 6/day  -when was the last dose patient took? This morning   The patient was advised the importance of maintaining medication and not using illegal substances with these.  Here for refills and follow up  The patient was educated that we can provide 3 monthly scripts for their medication, it is their responsibility to follow the instructions.  Side effects or complications from medications: none  Patient is aware that pain medications are meant to minimize the severity of the pain to allow their pain levels to improve to allow for better function. They are aware of that pain medications cannot totally remove their pain.  Due for UDT ( at least once per year) : completed 09/2021  Scale of 1 to 10 ( 1 is least 10 is most) Your pain level without the medicine: 10 Your pain level with medication: 7  Scale 1 to 10 ( 1-helps very little, 10 helps very well) How well does your pain medication reduce your pain so you can function better through out the day? 10  Quality of the pain:   Persistence of the pain:   Modifying factors:   -abscess under right arm;painful (ABT called in yesterday) -burning a little with urinating; unable to completely void.  -pt states she weighed 237 lb at lung doctor   -cholesterol med-is it helping  Review of Systems     Objective:   Physical Exam  General-in no acute distress Eyes-no discharge Lungs-respiratory rate normal, CTA CV-no murmurs,RRR Extremities skin warm dry no edema Neuro grossly normal Behavior normal, alert       Assessment & Plan:   1. Avascular necrosis of bone of hip, left [facility]) Patient has a hard time finding orthopedist willing to do surgery they are concerned about possible infection which is a legitimate concern she states her pulmonologist told her it is okay for her to do surgery but Ortho unlikely to do surgery  On chronic pain medicines takes up to 6/day it does benefit her drug registry checked  The patient was seen in followup for chronic pain. A review over at their current pain status was discussed. Drug registry was checked. Prescriptions were given.  Regular follow-up recommended. Discussion was held regarding the importance of compliance with medication as well as pain medication contract.  Patient was informed that medication may cause drowsiness and should not be combined  with other medications/alcohol or street drugs. If the patient feels medication is causing altered alertness then do not drive or operate dangerous equipment.  Should be noted that the patient appears to be meeting appropriate use of opioids and response.  Evidenced by improved function and decent pain control without significant side effects and no evidence of overt aberrancy issues.  Upon discussion with the patient today they understand that opioid therapy is optional and they feel that the pain has been refractory to reasonable conservative measures and is significant and affecting quality of life enough to warrant ongoing therapy and wishes to continue opioids.  Refills were provided.   2. Cutaneous abscess of right axilla  Doxycycline warm compresses follow-up later this week if turning into an abscess that needs draining  3. Morbid obesity ([place]) Portion control she is not a good candidate for GLP-1 medicines Possibly gastric sleeve in the future - Basic metabolic panel - Lipid panel - Hepatic function panel  4. Hyperlipidemia, unspecified hyperlipidemia type Continue medication check lab work - Basic metabolic panel -  Lipid panel - Hepatic function panel  5. High risk medication use Check labs - Basic metabolic panel - Lipid panel - Hepatic function panel  Follow-up 3 months

## 2022-07-04 ENCOUNTER — Other Ambulatory Visit: Payer: Self-pay | Admitting: "Endocrinology

## 2022-07-04 ENCOUNTER — Other Ambulatory Visit: Payer: Self-pay | Admitting: Family Medicine

## 2022-07-04 ENCOUNTER — Other Ambulatory Visit (HOSPITAL_COMMUNITY): Payer: Self-pay

## 2022-07-04 MED ORDER — PREDNISONE 5 MG PO TABS
5.0000 mg | ORAL_TABLET | Freq: Every morning | ORAL | 11 refills | Status: DC
  Filled 2022-07-04: qty 30, 30d supply, fill #0
  Filled 2022-08-02: qty 30, 30d supply, fill #1
  Filled 2022-09-07: qty 30, 30d supply, fill #2
  Filled 2022-10-04: qty 30, 30d supply, fill #3
  Filled 2022-10-29: qty 30, 30d supply, fill #4
  Filled 2022-12-01: qty 30, 30d supply, fill #5
  Filled 2023-01-17: qty 30, 30d supply, fill #6
  Filled 2023-02-26: qty 30, 30d supply, fill #7
  Filled 2023-03-28: qty 30, 30d supply, fill #8
  Filled 2023-04-19 – 2023-04-20 (×2): qty 30, 30d supply, fill #9
  Filled 2023-06-03: qty 30, 30d supply, fill #10
  Filled 2023-06-28: qty 30, 30d supply, fill #11

## 2022-07-04 MED ORDER — OXYCODONE HCL 10 MG PO TABS
ORAL_TABLET | ORAL | 0 refills | Status: DC
  Filled 2022-07-04: qty 180, fill #0
  Filled 2022-08-16: qty 180, 30d supply, fill #0

## 2022-07-04 MED ORDER — OXYCODONE HCL 10 MG PO TABS
10.0000 mg | ORAL_TABLET | ORAL | 0 refills | Status: DC | PRN
  Filled 2022-07-04: qty 180, fill #0
  Filled 2022-07-18: qty 180, 30d supply, fill #0

## 2022-07-04 MED ORDER — LEVOTHYROXINE SODIUM 50 MCG PO TABS
50.0000 ug | ORAL_TABLET | Freq: Every day | ORAL | 1 refills | Status: DC
  Filled 2022-07-04: qty 90, 90d supply, fill #0
  Filled 2022-09-20: qty 90, 90d supply, fill #1

## 2022-07-04 MED ORDER — ONDANSETRON 8 MG PO TBDP
8.0000 mg | ORAL_TABLET | Freq: Three times a day (TID) | ORAL | 5 refills | Status: DC | PRN
  Filled 2022-07-04: qty 15, 5d supply, fill #0
  Filled 2022-08-21: qty 15, 5d supply, fill #1
  Filled 2022-08-28: qty 15, 5d supply, fill #2
  Filled 2022-09-17 – 2022-10-04 (×2): qty 15, 5d supply, fill #0
  Filled 2022-10-29 – 2022-11-08 (×2): qty 15, 5d supply, fill #1

## 2022-07-04 MED ORDER — OXYCODONE HCL 10 MG PO TABS
ORAL_TABLET | ORAL | 0 refills | Status: DC
  Filled 2022-07-04: qty 180, fill #0

## 2022-07-04 NOTE — Telephone Encounter (Signed)
Medications were updated

## 2022-07-05 ENCOUNTER — Other Ambulatory Visit (HOSPITAL_COMMUNITY): Payer: Self-pay

## 2022-07-09 ENCOUNTER — Ambulatory Visit: Payer: Medicare Other | Admitting: Family Medicine

## 2022-07-13 ENCOUNTER — Other Ambulatory Visit: Payer: Self-pay | Admitting: Family Medicine

## 2022-07-13 ENCOUNTER — Telehealth: Payer: Self-pay | Admitting: Family Medicine

## 2022-07-13 MED ORDER — DOXYCYCLINE HYCLATE 100 MG PO TABS: 100.0000 mg | ORAL_TABLET | Freq: Two times a day (BID) | ORAL | 0 refills | Status: DC

## 2022-07-13 NOTE — Telephone Encounter (Signed)
Cook, Jayce G, DO   ? ?Rx sent.   ? ?

## 2022-07-13 NOTE — Telephone Encounter (Signed)
Patient is requesting another round of doxycycline for abscess under arm . [place]

## 2022-07-13 NOTE — Telephone Encounter (Signed)
Patient notified

## 2022-07-16 ENCOUNTER — Encounter (INDEPENDENT_AMBULATORY_CARE_PROVIDER_SITE_OTHER): Payer: Self-pay | Admitting: Gastroenterology

## 2022-07-18 ENCOUNTER — Other Ambulatory Visit (HOSPITAL_COMMUNITY): Payer: Self-pay

## 2022-07-19 LAB — BASIC METABOLIC PANEL
BUN/Creatinine Ratio: 12 (ref 9–23)
BUN: 10 mg/dL (ref 6–24)
CO2: 26 mmol/L (ref 20–29)
Calcium: 10 mg/dL (ref 8.7–10.2)
Chloride: 100 mmol/L (ref 96–106)
Creatinine, Ser: 0.82 mg/dL (ref 0.57–1.00)
Glucose: 89 mg/dL (ref 70–99)
Potassium: 4.4 mmol/L (ref 3.5–5.2)
Sodium: 141 mmol/L (ref 134–144)
eGFR: 82 mL/min/{1.73_m2} (ref >59)

## 2022-07-19 LAB — HEPATIC FUNCTION PANEL
ALT: 16 IU/L (ref 0–32)
AST: 15 IU/L (ref 0–40)
Albumin: 4.1 g/dL (ref 3.8–4.9)
Alkaline Phosphatase: 76 IU/L (ref 44–121)
Bilirubin Total: 0.3 mg/dL (ref 0.0–1.2)
Bilirubin, Direct: 0.11 mg/dL (ref 0.00–0.40)
Total Protein: 6.5 g/dL (ref 6.0–8.5)

## 2022-07-19 LAB — LIPID PANEL
Chol/HDL Ratio: 3.1 ratio (ref 0.0–4.4)
Cholesterol, Total: 193 mg/dL (ref 100–199)
HDL: 63 mg/dL (ref >39)
LDL Chol Calc (NIH): 87 mg/dL (ref 0–99)
Triglycerides: 261 mg/dL — ABNORMAL HIGH (ref 0–149)
VLDL Cholesterol Cal: 43 mg/dL — ABNORMAL HIGH (ref 5–40)

## 2022-07-20 ENCOUNTER — Other Ambulatory Visit (HOSPITAL_COMMUNITY): Payer: Self-pay

## 2022-07-23 ENCOUNTER — Other Ambulatory Visit: Payer: Self-pay | Admitting: "Endocrinology

## 2022-07-23 ENCOUNTER — Other Ambulatory Visit (HOSPITAL_COMMUNITY): Payer: Self-pay

## 2022-07-25 ENCOUNTER — Ambulatory Visit: Payer: Medicare Other | Admitting: Family Medicine

## 2022-07-25 ENCOUNTER — Other Ambulatory Visit (HOSPITAL_COMMUNITY): Payer: Self-pay

## 2022-07-25 MED ORDER — HYDROCORTISONE 10 MG PO TABS
ORAL_TABLET | ORAL | 3 refills | Status: DC
  Filled 2022-07-25: qty 120, 30d supply, fill #0
  Filled 2022-08-28: qty 120, 30d supply, fill #1
  Filled 2022-09-20: qty 120, 30d supply, fill #2

## 2022-07-26 ENCOUNTER — Telehealth: Payer: Self-pay | Admitting: Family Medicine

## 2022-07-26 NOTE — Telephone Encounter (Signed)
Pt came into office requesting to see PCP. Pt advised that PCP was completely booked at this time. Pt states she is having issues with constipation. Has been using suppositories, enemas and taking a whole box of chocolate Ex-Lax. Pt states after taking the exlax she has a blowout and feels much better. Pt states the Linzess seems to be not working. Pt has also tried Miralax and that has not helped. Pt scheduled for Monday. Please advise. Thank you [employer].   Pt also showed picture of lock on medication cabinet.

## 2022-07-26 NOTE — Telephone Encounter (Signed)
There are no simple answers Even though MiraLAX is not helping I would continue to use MiraLAX 1 capful in a glass of water preferably twice per day She can also get prune juice 2 to 4 ounces per day mixed in with lemonade or other juice to make it more palatable Keep appointment Obviously if dramatically worse to the ER but hopefully it does not get to that point

## 2022-07-26 NOTE — Telephone Encounter (Signed)
Pt contacted and verbalized understanding.  

## 2022-07-27 ENCOUNTER — Other Ambulatory Visit (HOSPITAL_COMMUNITY): Payer: Self-pay

## 2022-07-28 ENCOUNTER — Other Ambulatory Visit (HOSPITAL_COMMUNITY): Payer: Self-pay

## 2022-07-30 ENCOUNTER — Other Ambulatory Visit (HOSPITAL_COMMUNITY): Payer: Self-pay

## 2022-07-30 ENCOUNTER — Ambulatory Visit (INDEPENDENT_AMBULATORY_CARE_PROVIDER_SITE_OTHER): Payer: Medicare Other | Admitting: Family Medicine

## 2022-07-30 VITALS — BP 130/80 | HR 88 | Temp 97.9°F | Wt 241.0 lb

## 2022-07-30 DIAGNOSIS — F321 Major depressive disorder, single episode, moderate: Secondary | ICD-10-CM | POA: Diagnosis not present

## 2022-07-30 DIAGNOSIS — J849 Interstitial pulmonary disease, unspecified: Secondary | ICD-10-CM | POA: Diagnosis not present

## 2022-07-30 DIAGNOSIS — K5900 Constipation, unspecified: Secondary | ICD-10-CM

## 2022-07-30 MED ORDER — ESCITALOPRAM OXALATE 10 MG PO TABS
10.0000 mg | ORAL_TABLET | Freq: Every day | ORAL | 1 refills | Status: DC
  Filled 2022-07-30: qty 30, 30d supply, fill #0
  Filled 2022-10-04: qty 30, 30d supply, fill #1
  Filled 2022-10-29: qty 30, 30d supply, fill #2
  Filled 2022-11-12: qty 30, 30d supply, fill #3
  Filled 2022-12-01: qty 30, 30d supply, fill #4
  Filled 2022-12-22 – 2022-12-24 (×2): qty 30, 30d supply, fill #5

## 2022-07-30 NOTE — Progress Notes (Signed)
   Subjective:    Patient ID: Kathleen [name], female    DOB: [birth year], 61 y.o.   MRN: [id number]  HPI Patint arrives today c/o of constipation.    Review of Systems     Objective:   Physical Exam  General-in no acute distress Eyes-no discharge Lungs-respiratory rate normal, CTA CV-no murmurs,RRR Extremities skin warm dry no edema Neuro grossly normal Behavior normal, alert       Assessment & Plan:  Patient with emotional stress and depression symptoms She did not tolerate Prozac We did discuss how possibly Lexapro would help Start this Encourage consideration for counseling  She recently had a echo done at [facility] awaiting the results hopefully this comes back good  She has a lot of fear and anxiety regarding the future she feels that her lung condition at any moment could get dramatically worse and cause her to die she states she finds that very terrifying  Constipation issue is present probably related to the inactivity because of her hip necrosis as well as inability to stay active and opioid use.  Because of all this she states she has been doing MiraLAX along with warm apple juice and prune juice this seems to be helping we did discuss ways of having that on a daily basis with the goal of having soft bowel movements.  I do not feel she needs to have a colonoscopy or lab work currently.  25 minutes spent with patient regarding all of these issues and documentation  After discussion with the patient she would like to follow-up in 4 weeks to recheck how her depression is doing

## 2022-08-02 ENCOUNTER — Other Ambulatory Visit (HOSPITAL_COMMUNITY): Payer: Self-pay

## 2022-08-04 ENCOUNTER — Other Ambulatory Visit (HOSPITAL_COMMUNITY): Payer: Self-pay

## 2022-08-06 ENCOUNTER — Other Ambulatory Visit (HOSPITAL_COMMUNITY): Payer: Self-pay

## 2022-08-16 ENCOUNTER — Other Ambulatory Visit (HOSPITAL_COMMUNITY): Payer: Self-pay

## 2022-08-21 ENCOUNTER — Other Ambulatory Visit (HOSPITAL_COMMUNITY): Payer: Self-pay

## 2022-08-21 ENCOUNTER — Telehealth: Payer: Self-pay

## 2022-08-21 NOTE — Telephone Encounter (Signed)
Patient scheduled for tomorrow at 11:30

## 2022-08-21 NOTE — Telephone Encounter (Signed)
I can work her in tomorrow at [employer] so we can evaluate these abscesses then treat with antibiotics or if we need to do I&D we can do so Warm compresses frequently for now

## 2022-08-21 NOTE — Telephone Encounter (Signed)
Caller name: LOVE CHOWNING  On DPR?: Yes  Call back number: [phone] (mobile)  Provider they see: [name], MD  Reason for call:Abscess has come back wants to see if she can get more medication called in to [facility] [address] I told her she will need appt for antibiotic

## 2022-08-21 NOTE — Telephone Encounter (Addendum)
Called patient , she states she has three abscesses going on , one on her head, nostril, and under arm. Doxycycline works for them for about 2 to 3 weeks only and return , has an appt in early January. Please sent to [facility] [address]

## 2022-08-22 ENCOUNTER — Ambulatory Visit (INDEPENDENT_AMBULATORY_CARE_PROVIDER_SITE_OTHER): Payer: Medicare Other | Admitting: Family Medicine

## 2022-08-22 ENCOUNTER — Other Ambulatory Visit (HOSPITAL_COMMUNITY): Payer: Self-pay

## 2022-08-22 VITALS — BP 130/72 | HR 78 | Temp 97.5°F | Wt 257.4 lb

## 2022-08-22 DIAGNOSIS — L739 Follicular disorder, unspecified: Secondary | ICD-10-CM

## 2022-08-22 DIAGNOSIS — J340 Abscess, furuncle and carbuncle of nose: Secondary | ICD-10-CM

## 2022-08-22 MED ORDER — MUPIROCIN 2 % EX OINT
TOPICAL_OINTMENT | CUTANEOUS | 0 refills | Status: DC
  Filled 2022-08-22: qty 22, 10d supply, fill #0

## 2022-08-22 MED ORDER — DOXYCYCLINE HYCLATE 100 MG PO TABS
100.0000 mg | ORAL_TABLET | Freq: Two times a day (BID) | ORAL | 1 refills | Status: DC
  Filled 2022-08-22: qty 28, 14d supply, fill #0
  Filled 2022-09-10: qty 28, 14d supply, fill #1

## 2022-08-22 NOTE — Progress Notes (Signed)
   Subjective:    Patient ID: Kathleen [name], female    DOB: [birth year], 61 y.o.   MRN: [id number]  HPI Patient arrives today with abscess in the right nostril and on the patient's scalp. Patient has underlying lung condition because of the medicine she is on she has immunosuppression she has multiple sores 1 in the nose 1 in the scalp 1 in the left axilla painful and tender has a history of MRSA Patient states that the sores on scalp and in nostril are painful.     Review of Systems     Objective:   Physical Exam Folliculitis noted in the axilla as well as the scalp and also a small folliculitis versus early abscess inside the nostril on the right side no cellulitis noted tender to the touch present       Assessment & Plan:  1. Folliculitis Folliculitis noted in the scalp under the left arm axilla area as well as nostril doxycycline twice daily for 2 weeks warm compresses warning signs regarding abscess discussed  2. Abscess of nose This is more of a folliculitis but it does have a small pustule with it watch closely for any development of abscess use warm compresses frequently warnings discussed.

## 2022-08-28 ENCOUNTER — Other Ambulatory Visit (HOSPITAL_COMMUNITY): Payer: Self-pay

## 2022-08-31 ENCOUNTER — Ambulatory Visit: Payer: Medicare Other | Admitting: Family Medicine

## 2022-09-04 ENCOUNTER — Other Ambulatory Visit (HOSPITAL_COMMUNITY): Payer: Self-pay

## 2022-09-07 ENCOUNTER — Other Ambulatory Visit (HOSPITAL_COMMUNITY): Payer: Self-pay

## 2022-09-10 ENCOUNTER — Telehealth: Payer: Self-pay

## 2022-09-10 ENCOUNTER — Other Ambulatory Visit (HOSPITAL_COMMUNITY): Payer: Self-pay

## 2022-09-10 ENCOUNTER — Ambulatory Visit (INDEPENDENT_AMBULATORY_CARE_PROVIDER_SITE_OTHER): Payer: Medicare Other | Admitting: Family Medicine

## 2022-09-10 VITALS — BP 118/64 | HR 68 | Temp 98.2°F | Ht 68.75 in | Wt 261.2 lb

## 2022-09-10 DIAGNOSIS — I700 Atherosclerosis of aorta: Secondary | ICD-10-CM | POA: Insufficient documentation

## 2022-09-10 DIAGNOSIS — L739 Follicular disorder, unspecified: Secondary | ICD-10-CM | POA: Diagnosis not present

## 2022-09-10 DIAGNOSIS — N644 Mastodynia: Secondary | ICD-10-CM

## 2022-09-10 DIAGNOSIS — J849 Interstitial pulmonary disease, unspecified: Secondary | ICD-10-CM

## 2022-09-10 DIAGNOSIS — F321 Major depressive disorder, single episode, moderate: Secondary | ICD-10-CM | POA: Insufficient documentation

## 2022-09-10 DIAGNOSIS — E2740 Unspecified adrenocortical insufficiency: Secondary | ICD-10-CM

## 2022-09-10 DIAGNOSIS — Z1231 Encounter for screening mammogram for malignant neoplasm of breast: Secondary | ICD-10-CM

## 2022-09-10 DIAGNOSIS — M87052 Idiopathic aseptic necrosis of left femur: Secondary | ICD-10-CM | POA: Diagnosis not present

## 2022-09-10 DIAGNOSIS — Z79891 Long term (current) use of opiate analgesic: Secondary | ICD-10-CM

## 2022-09-10 MED ORDER — OXYCODONE HCL 10 MG PO TABS
10.0000 mg | ORAL_TABLET | ORAL | 0 refills | Status: DC | PRN
  Filled 2022-09-10 – 2022-11-13 (×3): qty 180, 30d supply, fill #0

## 2022-09-10 MED ORDER — OXYCODONE HCL 10 MG PO TABS
10.0000 mg | ORAL_TABLET | ORAL | 0 refills | Status: DC | PRN
  Filled 2022-09-10: qty 180, fill #0
  Filled 2022-09-20 – 2022-10-15 (×2): qty 180, 30d supply, fill #0

## 2022-09-10 MED ORDER — OXYCODONE HCL 10 MG PO TABS
10.0000 mg | ORAL_TABLET | ORAL | 0 refills | Status: DC | PRN
  Filled 2022-09-10: qty 180, fill #0
  Filled 2022-09-17: qty 42, 7d supply, fill #0
  Filled 2022-09-24 (×2): qty 35, 5d supply, fill #1
  Filled 2022-09-24: qty 103, 18d supply, fill #1
  Filled 2022-09-24 (×2): qty 138, 23d supply, fill #1
  Filled 2022-09-24: qty 103, 18d supply, fill #1
  Filled 2022-09-24: qty 138, 23d supply, fill #1
  Filled ????-??-??: fill #1

## 2022-09-10 NOTE — Progress Notes (Signed)
Subjective:    Patient ID: Kathleen [name], female    DOB: 1960-10-12, 62 y.o.   MRN: [id number]  HPI  Patient arrives today for abscess follow up. This area is doing much better.  Less tenderness.  No drainage. The nose is doing better.  She now relates some pain and discomfort in the left breast feels like there is a firmness there she is uncertain we will examine it today  Patient states that they are not gone but seem to be doing better.  This patient was seen today for chronic pain  The medication list was reviewed and updated.  Location of Pain for which the patient has been treated with regarding narcotics: Hip pain and back pain has history of osteo necrosis avascular of the hip  Onset of this pain: Present for several years   -Compliance with medication: Good compliance with medicine  - Number patient states they take daily: 6/day  -when was the last dose patient took?  Earlier today  The patient was advised the importance of maintaining medication and not using illegal substances with these.  Here for refills and follow up  The patient was educated that we can provide 3 monthly scripts for their medication, it is their responsibility to follow the instructions.  Side effects or complications from medications: Denies side effects with medicine  Patient is aware that pain medications are meant to minimize the severity of the pain to allow their pain levels to improve to allow for better function. They are aware of that pain medications cannot totally remove their pain.  Due for UDT ( at least once per year) : She will be due by February  Scale of 1 to 10 ( 1 is least 10 is most) Your pain level without the medicine: 8 Your pain level with medication 5  Scale 1 to 10 ( 1-helps very little, 10 helps very well) How well does your pain medication reduce your pain so you can function better through out the day? 8  Quality of the pain: Constant aching discomfort  especially with activity  Persistence of the pain: Present all the time  Modifying factors: Worse with activity       Review of Systems     Objective:   Physical Exam  General-in no acute distress Eyes-no discharge Lungs-respiratory rate normal, CTA CV-no murmurs,RRR Extremities skin warm dry no edema Neuro grossly normal Behavior normal, alert Subjective bilateral breast tenderness but no masses are felt      Assessment & Plan:  More than likely fibrocystic disease Will need diagnostic mammogram bilateral Due for mammogram 1. Folliculitis She is finishing up doxycycline and the abscess under the arm is doing better  2. Aortic atherosclerosis ([place]) She has aortic atherosclerosis takes statin watch his diet stays active  3. Depression, major, single episode, moderate ([place]) Has depression on medication she feels she is stable currently but not resolved  4. Avascular necrosis of bone of hip, left ([place]) Unfortunately surgery refuses to do her surgery because of her underlying interstitial lung disease and being on prednisone this causes her a lot of pain and causing her to be on the pain medicine she is  5. Interstitial lung disease ([place]) She is followed by Dr. Farley Ly at [facility].  She is on immunosuppressants.  6. Adrenal insufficiency ([place]) She is on steroids.  She knows if she has a significant setback with infection to immediately seek care  7. Morbid obesity ([place]) Portion control regular activity  8. Breast pain, left Ultrasound and mammogram is recommended - US BREAST LTD UNI LEFT INC AXILLA - US BREAST LTD UNI RIGHT INC AXILLA - MM DIAG BREAST TOMO BILATERAL  9. Screening mammogram for breast cancer See above  10. Breast pain, right Ultrasound mammograms - US BREAST LTD UNI LEFT INC AXILLA - US BREAST LTD UNI RIGHT INC AXILLA - MM DIAG BREAST TOMO BILATERAL  11. Encounter for long-term opiate analgesic use The patient was seen in followup for chronic  pain. A review over at their current pain status was discussed. Drug registry was checked. Prescriptions were given.  Regular follow-up recommended. Discussion was held regarding the importance of compliance with medication as well as pain medication contract.  Patient was informed that medication may cause drowsiness and should not be combined  with other medications/alcohol or street drugs. If the patient feels medication is causing altered alertness then do not drive or operate dangerous equipment.  Should be noted that the patient appears to be meeting appropriate use of opioids and response.  Evidenced by improved function and decent pain control without significant side effects and no evidence of overt aberrancy issues.  Upon discussion with the patient today they understand that opioid therapy is optional and they feel that the pain has been refractory to reasonable conservative measures and is significant and affecting quality of life enough to warrant ongoing therapy and wishes to continue opioids.  Refills were provided.  This patient has had severe pain and cannot take anti-inflammatories.  She has been on pain medicine for years stable with her dosing.  She is under a drug contract.  She does urine drug screens yearly.  She does regular follow-up office visits every 3 months.  Surgery stated that they cannot help  Follow-up by early April

## 2022-09-10 NOTE — Telephone Encounter (Signed)
Spoke with patient per Dr Nicki Reaper and advised patient of Mammo appointment 09/27/2021 at 2:20pm arrive at 2:00pm [facility] Patient verbalized understanding.

## 2022-09-13 ENCOUNTER — Other Ambulatory Visit (HOSPITAL_COMMUNITY): Payer: Self-pay

## 2022-09-17 ENCOUNTER — Other Ambulatory Visit (HOSPITAL_COMMUNITY): Payer: Self-pay

## 2022-09-20 ENCOUNTER — Other Ambulatory Visit (HOSPITAL_COMMUNITY): Payer: Self-pay

## 2022-09-20 ENCOUNTER — Ambulatory Visit: Payer: Medicare Other | Admitting: "Endocrinology

## 2022-09-20 NOTE — Patient Instructions (Signed)
Kathleen [name] , Thank you for taking time to come for your Medicare Wellness Visit. I appreciate your ongoing commitment to your health goals. Please review the following plan we discussed and let me know if I can assist you in the future.   These are the goals we discussed:  Goals   None     This is a list of the screening recommended for you and due dates:  Health Maintenance  Topic Date Due   DTaP/Tdap/Td vaccine (1 - Tdap) Never done   Zoster (Shingles) Vaccine (1 of 2) Never done   Mammogram  07/19/2021   Pap Smear  06/08/2022   Colon Cancer Screening  01/03/2029   Hepatitis C Screening: USPSTF Recommendation to screen - Ages 18-79 yo.  Completed   HIV Screening  Completed   HPV Vaccine  Aged Out   Flu Shot  Discontinued   COVID-19 Vaccine  Discontinued    Advanced directives: Forms are available if you choose in the future to pursue completion.  This is recommended in order to make sure that your health wishes are honored in the event that you are unable to verbalize them to the provider.    Conditions/risks identified: Aim for 30 minutes of exercise or brisk walking, 6-8 glasses of water, and 5 servings of fruits and vegetables each day.   Next appointment: Follow up in one year for your annual wellness visit.   Preventive Care 40-64 Years, Female Preventive care refers to lifestyle choices and visits with your health care provider that can promote health and wellness. What does preventive care include? A yearly physical exam. This is also called an annual well check. Dental exams once or twice a year. Routine eye exams. Ask your health care provider how often you should have your eyes checked. Personal lifestyle choices, including: Daily care of your teeth and gums. Regular physical activity. Eating a healthy diet. Avoiding tobacco and drug use. Limiting alcohol use. Practicing safe sex. Taking low-dose aspirin daily starting at age 87. Taking vitamin and mineral  supplements as recommended by your health care provider. What happens during an annual well check? The services and screenings done by your health care provider during your annual well check will depend on your age, overall health, lifestyle risk factors, and family history of disease. Counseling  Your health care provider may ask you questions about your: Alcohol use. Tobacco use. Drug use. Emotional well-being. Home and relationship well-being. Sexual activity. Eating habits. Work and work Statistician. Method of birth control. Menstrual cycle. Pregnancy history. Screening  You may have the following tests or measurements: Height, weight, and BMI. Blood pressure. Lipid and cholesterol levels. These may be checked every 5 years, or more frequently if you are over 57 years old. Skin check. Lung cancer screening. You may have this screening every year starting at age 69 if you have a 30-pack-year history of smoking and currently smoke or have quit within the past 15 years. Fecal occult blood test (FOBT) of the stool. You may have this test every year starting at age 2. Flexible sigmoidoscopy or colonoscopy. You may have a sigmoidoscopy every 5 years or a colonoscopy every 10 years starting at age 66. Hepatitis C blood test. Hepatitis B blood test. Sexually transmitted disease (STD) testing. Diabetes screening. This is done by checking your blood sugar (glucose) after you have not eaten for a while (fasting). You may have this done every 1-3 years. Mammogram. This may be done every 1-2 years. Talk to  your health care provider about when you should start having regular mammograms. This may depend on whether you have a family history of breast cancer. BRCA-related cancer screening. This may be done if you have a family history of breast, ovarian, tubal, or peritoneal cancers. Pelvic exam and Pap test. This may be done every 3 years starting at age 68. Starting at age 62, this may be done  every 5 years if you have a Pap test in combination with an HPV test. Bone density scan. This is done to screen for osteoporosis. You may have this scan if you are at high risk for osteoporosis. Discuss your test results, treatment options, and if necessary, the need for more tests with your health care provider. Vaccines  Your health care provider may recommend certain vaccines, such as: Influenza vaccine. This is recommended every year. Tetanus, diphtheria, and acellular pertussis (Tdap, Td) vaccine. You may need a Td booster every 10 years. Zoster vaccine. You may need this after age 15. Pneumococcal 13-valent conjugate (PCV13) vaccine. You may need this if you have certain conditions and were not previously vaccinated. Pneumococcal polysaccharide (PPSV23) vaccine. You may need one or two doses if you smoke cigarettes or if you have certain conditions. Talk to your health care provider about which screenings and vaccines you need and how often you need them. This information is not intended to replace advice given to you by your health care provider. Make sure you discuss any questions you have with your health care provider. Document Released: 09/09/2015 Document Revised: 05/02/2016 Document Reviewed: 06/14/2015 Elsevier Interactive Patient Education  2017 [place] Prevention in the Home Falls can cause injuries. They can happen to people of all ages. There are many things you can do to make your home safe and to help prevent falls. What can I do on the outside of my home? Regularly fix the edges of walkways and driveways and fix any cracks. Remove anything that might make you trip as you walk through a door, such as a raised step or threshold. Trim any bushes or trees on the path to your home. Use bright outdoor lighting. Clear any walking paths of anything that might make someone trip, such as rocks or tools. Regularly check to see if handrails are loose or broken. Make  sure that both sides of any steps have handrails. Any raised decks and porches should have guardrails on the edges. Have any leaves, snow, or ice cleared regularly. Use sand or salt on walking paths during winter. Clean up any spills in your garage right away. This includes oil or grease spills. What can I do in the bathroom? Use night lights. Install grab bars by the toilet and in the tub and shower. Do not use towel bars as grab bars. Use non-skid mats or decals in the tub or shower. If you need to sit down in the shower, use a plastic, non-slip stool. Keep the floor dry. Clean up any water that spills on the floor as soon as it happens. Remove soap buildup in the tub or shower regularly. Attach bath mats securely with double-sided non-slip rug tape. Do not have throw rugs and other things on the floor that can make you trip. What can I do in the bedroom? Use night lights. Make sure that you have a light by your bed that is easy to reach. Do not use any sheets or blankets that are too big for your bed. They should not hang  down onto the floor. Have a firm chair that has side arms. You can use this for support while you get dressed. Do not have throw rugs and other things on the floor that can make you trip. What can I do in the kitchen? Clean up any spills right away. Avoid walking on wet floors. Keep items that you use a lot in easy-to-reach places. If you need to reach something above you, use a strong step stool that has a grab bar. Keep electrical cords out of the way. Do not use floor polish or wax that makes floors slippery. If you must use wax, use non-skid floor wax. Do not have throw rugs and other things on the floor that can make you trip. What can I do with my stairs? Do not leave any items on the stairs. Make sure that there are handrails on both sides of the stairs and use them. Fix handrails that are broken or loose. Make sure that handrails are as long as the  stairways. Check any carpeting to make sure that it is firmly attached to the stairs. Fix any carpet that is loose or worn. Avoid having throw rugs at the top or bottom of the stairs. If you do have throw rugs, attach them to the floor with carpet tape. Make sure that you have a light switch at the top of the stairs and the bottom of the stairs. If you do not have them, ask someone to add them for you. What else can I do to help prevent falls? Wear shoes that: Do not have high heels. Have rubber bottoms. Are comfortable and fit you well. Are closed at the toe. Do not wear sandals. If you use a stepladder: Make sure that it is fully opened. Do not climb a closed stepladder. Make sure that both sides of the stepladder are locked into place. Ask someone to hold it for you, if possible. Clearly mark and make sure that you can see: Any grab bars or handrails. First and last steps. Where the edge of each step is. Use tools that help you move around (mobility aids) if they are needed. These include: Canes. Walkers. Scooters. Crutches. Turn on the lights when you go into a dark area. Replace any light bulbs as soon as they burn out. Set up your furniture so you have a clear path. Avoid moving your furniture around. If any of your floors are uneven, fix them. If there are any pets around you, be aware of where they are. Review your medicines with your doctor. Some medicines can make you feel dizzy. This can increase your chance of falling. Ask your doctor what other things that you can do to help prevent falls. This information is not intended to replace advice given to you by your health care provider. Make sure you discuss any questions you have with your health care provider. Document Released: 06/09/2009 Document Revised: 01/19/2016 Document Reviewed: 09/17/2014 Elsevier Interactive Patient Education  2017 [employer].

## 2022-09-20 NOTE — Progress Notes (Signed)
Subjective:   Kathleen [name] is a 62 y.o. female who presents for an Initial Medicare Annual Wellness Visit.  I connected with  Kathleen [name] on 09/21/22 by a audio enabled telemedicine application and verified that I am speaking with the correct person using two identifiers.  Patient Location: Home  Provider Location: Office/Clinic  I discussed the limitations of evaluation and management by telemedicine. The patient expressed understanding and agreed to proceed.  Review of Systems     Cardiac Risk Factors include: sedentary lifestyle;obesity (BMI >30kg/m2)     Objective:    Today's Vitals   09/21/22 0836  Weight: 261 lb (118.4 kg)  Height: 5' 8.75" (1.746 m)   Body mass index is 38.82 kg/m.     09/21/2022    8:56 AM 03/02/2022    3:27 PM 01/03/2022   11:50 AM 12/17/2021    9:56 PM 09/20/2021    6:14 AM 05/21/2021    8:01 PM 03/30/2021    5:26 PM  Advanced Directives  Does Patient Have a Medical Advance Directive? No No No No No No No  Would patient like information on creating a medical advance directive? No - Patient declined No - Patient declined No - Patient declined  No - Patient declined  No - Patient declined    Current Medications (verified) Outpatient Encounter Medications as of 09/21/2022  Medication Sig   acetaminophen (TYLENOL) 500 MG tablet Take 1,000 mg by mouth every 8 (eight) hours as needed for headache.   albuterol (VENTOLIN HFA) 108 (90 Base) MCG/ACT inhaler Inhale 2 puffs into the lungs every 6 (six) hours as needed for wheezing or shortness of breath.   ascorbic acid (VITAMIN C) 500 MG tablet Take 1 tablet (500 mg total) by mouth daily.   budesonide (PULMICORT) 0.5 MG/2ML nebulizer solution Inhale 2 mL (0.5 mg total) by nebulization two (2) times a day as needed.   budesonide (PULMICORT) 0.5 MG/2ML nebulizer solution Inhale the contents of 1 vial via nebulizer two (2) times a day.   doxycycline (VIBRA-TABS) 100 MG tablet Take 1 tablet (100 mg total) by  mouth 2 (two) times daily.   EPINEPHrine 0.3 mg/0.3 mL IJ SOAJ injection Inject 0.3 mg into the muscle as needed for anaphylaxis.   escitalopram (LEXAPRO) 10 MG tablet Take 1 tablet (10 mg total) by mouth daily.   ferrous sulfate 325 (65 FE) MG EC tablet Take 325 mg by mouth as needed.   formoterol (PERFOROMIST) 20 MCG/2ML nebulizer solution Inhale 1 vial via nebulizer two times a day.   formoterol (PERFOROMIST) 20 MCG/2ML nebulizer solution Inhale the contents of 1 vial via nebulizer two (2) times a day.   hydrocortisone (CORTEF) 10 MG tablet Take 2 tablets by mouth at 8:00 am and 1 tablet at lunch. (May double dose on sick days x 3-5 days)   hydrOXYzine (ATARAX) 25 MG tablet TAKE 1 TABLET BY MOUTH 3 TIMES DAILY AS NEEDED FOR ANXIETY   levothyroxine (SYNTHROID) 50 MCG tablet Take 1 tablet by mouth daily before breakfast.   linaclotide (LINZESS) 290 MCG CAPS capsule Take 1 capsule (290 mcg total) by mouth daily before breakfast.   midodrine (PROAMATINE) 5 MG tablet TAKE 1 TABLET BY MOUTH TWICE DAILY WITH A MEAL   mupirocin ointment (BACTROBAN) 2 % Apply in right nostril twice daily   ondansetron (ZOFRAN-ODT) 4 MG disintegrating tablet Take 1 tablet (4 mg total) by mouth every 8 (eight) hours as needed for nausea or vomiting.   ondansetron (ZOFRAN-ODT)  8 MG disintegrating tablet Dissolve 1 tablet (8 mg total) by mouth every 8 (eight) hours as needed.   Oxycodone HCl 10 MG TABS Take 1 tablet (10 mg total) by mouth every 4 (four) hours as needed for pain. Max of 6 tablets daily.   Oxycodone HCl 10 MG TABS Take 1 tablet (10 mg total) by mouth every 4 (four) hours as needed for pain (max 6 tablets daily).Fill 08/31/22   Oxycodone HCl 10 MG TABS Take 1 tablet (10 mg total) by mouth every 4 (four) hours as needed for pain (max 6 tabs daily)   pantoprazole (PROTONIX) 40 MG tablet Take 1 tablet by mouth 2 times daily before a meal.   predniSONE (DELTASONE) 5 MG tablet Take 1 tablet (5 mg total) by mouth in  the morning.   rosuvastatin (CRESTOR) 10 MG tablet Take 1 tablet (10 mg total) by mouth daily.   sucralfate (CARAFATE) 1 GM/10ML suspension Take 10 mLs (1 g total) by mouth 4 (four) times daily.   No facility-administered encounter medications on file as of 09/21/2022.    Allergies (verified) Bee venom, Contrast media [iodinated contrast media], Lactose intolerance (gi), Bactrim [sulfamethoxazole-trimethoprim], Codeine, and Morphine and related   History: Past Medical History:  Diagnosis Date   Adrenal insufficiency (HCC)    Anemia    Anxiety    Avascular bone necrosis (HCC)    Breast fibrocystic disorder    Chronic pain    COPD (chronic obstructive pulmonary disease) ([place])    Encounter for long-term opiate analgesic use 02/02/2020   Patient has chronic pain with femoral head avascular necrosis.  Is under pain contract   Fibromyalgia    Gastroesophageal reflux disease    Hypothyroidism    Mitral valve prolapse    Orthostatic hypotension    Peripheral neuropathy    PONV (postoperative nausea and vomiting)    Restrictive lung disease    Moderate to severe   Sarcoidosis    Biopsy proven - [facility]   SOB (shortness of breath)    chronic   Past Surgical History:  Procedure Laterality Date   BIOPSY  10/17/2018   Procedure: BIOPSY;  Surgeon: Rogene Houston, MD;  [facility];  Service: Endoscopy;;  duodenum, antrum, gastric body   BIOPSY  03/03/2021   Procedure: BIOPSY;  Surgeon: Rogene Houston, MD;  [facility];  Service: Endoscopy;;   BREAST LUMPECTOMY Bilateral 01/12/2011   CHOLECYSTECTOMY N/A 04/17/2021   Procedure: LAPAROSCOPIC CHOLECYSTECTOMY;  Surgeon: Aviva Signs, MD;  Location: [facility];  Service: General;  Laterality: N/A;   COLONOSCOPY WITH PROPOFOL N/A 01/03/2022   Procedure: COLONOSCOPY WITH PROPOFOL;  Surgeon: Rogene Houston, MD;  [facility];  Service: Endoscopy;  Laterality: N/A;  220   ESOPHAGEAL DILATION N/A 03/03/2021    Procedure: ESOPHAGEAL DILATION;  Surgeon: Rogene Houston, MD;  [facility];  Service: Endoscopy;  Laterality: N/A;   ESOPHAGOGASTRODUODENOSCOPY (EGD) WITH PROPOFOL N/A 10/17/2018   Procedure: ESOPHAGOGASTRODUODENOSCOPY (EGD) WITH PROPOFOL;  Surgeon: Rogene Houston, MD;  [facility];  Service: Endoscopy;  Laterality: N/A;  2:25   ESOPHAGOGASTRODUODENOSCOPY (EGD) WITH PROPOFOL N/A 03/03/2021   Procedure: ESOPHAGOGASTRODUODENOSCOPY (EGD) WITH PROPOFOL;  Surgeon: Rogene Houston, MD;  [facility];  Service: Endoscopy;  Laterality: N/A;  12:15   POLYPECTOMY  01/03/2022   Procedure: POLYPECTOMY INTESTINAL;  Surgeon: Rogene Houston, MD;  [facility];  Service: Endoscopy;;   TUBAL LIGATION  Family History  Problem Relation Age of Onset   Cardiomyopathy Mother    Breast cancer Mother    Prostate cancer Father    Heart attack Maternal Grandmother    Heart attack Maternal Grandfather    Heart attack Paternal Grandmother    Bipolar disorder Daughter    Heart disease Maternal Aunt    Neurofibromatosis Cousin    Colon cancer Neg Hx    Social History   Socioeconomic History   Marital status: Married    Spouse name: Not on file   Number of children: 1   Years of education: Not on file   Highest education level: Not on file  Occupational History   Occupation: Teacher, early years/pre: [employer]  Tobacco Use   Smoking status: Never    Passive exposure: Never   Smokeless tobacco: Never  Vaping Use   Vaping Use: Never used  Substance and Sexual Activity   Alcohol use: No   Drug use: No   Sexual activity: Yes    Birth control/protection: Post-menopausal  Other Topics Concern   Not on file  Social History Narrative   Daily caffeine    Living with husband   Right handed   She has been on disability since 2014.    Social Determinants of Health   Financial Resource Strain: Low Risk  (09/21/2022)   Overall Financial Resource  Strain (CARDIA)    Difficulty of Paying Living Expenses: Not hard at all  Food Insecurity: No Food Insecurity (09/21/2022)   Hunger Vital Sign    Worried About Running Out of Food in the Last Year: Never true    Ran Out of Food in the Last Year: Never true  Transportation Needs: No Transportation Needs (09/21/2022)   PRAPARE - Hydrologist (Medical): No    Lack of Transportation (Non-Medical): No  Physical Activity: Inactive (09/21/2022)   Exercise Vital Sign    Days of Exercise per Week: 0 days    Minutes of Exercise per Session: 0 min  Stress: No Stress Concern Present (09/21/2022)   [place]    Feeling of Stress : Not at all  Social Connections: [place] (09/21/2022)   Social Connection and Isolation Panel [NHANES]    Frequency of Communication with Friends and Family: More than three times a week    Frequency of Social Gatherings with Friends and Family: Three times a week    Attends Religious Services: 1 to 4 times per year    Active Member of Clubs or Organizations: No    Attends Music therapist: 1 to 4 times per year    Marital Status: Married    Tobacco Counseling Counseling given: Not Answered   Clinical Intake:  Pre-visit preparation completed: Yes  Pain : No/denies pain  Diabetes: No  How often do you need to have someone help you when you read instructions, pamphlets, or other written materials from your doctor or pharmacy?: 1 - Never  Diabetic?No   Interpreter Needed?: No  Information entered by :: Denman George LPN   Activities of Daily Living    09/21/2022    8:56 AM 12/29/2021    1:45 PM  In your present state of health, do you have any difficulty performing the following activities:  Hearing? 0 0  Vision? 0 0  Difficulty concentrating or making decisions? 0 0  Walking or climbing stairs? 1 0  Dressing or bathing?  1 0  Doing  errands, shopping? 1   Preparing Food and eating ? N   Using the Toilet? N   In the past six months, have you accidently leaked urine? N   Do you have problems with loss of bowel control? N   Managing your Medications? N   Managing your Finances? N   Housekeeping or managing your Housekeeping? Y     Patient Care Team: [name], MD as PCP - General (Family Medicine) Patrina Levering, MD as Referring Physician (Internal Medicine)  Indicate any recent Medical Services you may have received from other than [facility] providers in the past year (date may be approximate).     Assessment:   This is a routine wellness examination for Kathleen [name].  Hearing/Vision screen Hearing Screening - Comments:: Denies hearing difficulties  Vision Screening - Comments:: up to date with routine eye exams   Dietary issues and exercise activities discussed: Current Exercise Habits: The patient does not participate in regular exercise at present   Goals Addressed             This Visit's Progress    DIET - INCREASE WATER INTAKE         Depression Screen    09/21/2022    8:45 AM 08/22/2022   11:39 AM 07/30/2022    3:59 PM 07/14/2021    8:37 AM 06/14/2021    8:31 AM 11/15/2020    8:54 AM 09/13/2020    1:35 PM  PHQ 2/9 Scores  PHQ - 2 Score 0 2 0 0 0  0  PHQ- 9 Score  6     1  Exception Documentation      Patient refusal     Fall Risk    09/21/2022    8:41 AM 08/22/2022   11:39 AM 07/30/2022    4:01 PM 07/14/2021    8:36 AM 06/27/2021    1:39 PM  Fall Risk   Falls in the past year? 0 0 0 0 0  Number falls in past yr: 0 0 0 0   Injury with Fall? 0 0 0 0   Risk for fall due to :    No Fall Risks   Follow up Falls prevention discussed;Education provided;Falls evaluation completed   Falls evaluation completed     FALL RISK PREVENTION PERTAINING TO THE HOME:  Any stairs in or around the home? No  If so, are there any without handrails? No  Home free of loose throw rugs in walkways, pet  beds, electrical cords, etc? Yes  Adequate lighting in your home to reduce risk of falls? Yes   ASSISTIVE DEVICES UTILIZED TO PREVENT FALLS:  Life alert? No  Use of a cane, walker or w/c? Yes  Grab bars in the bathroom? Yes  Shower chair or bench in shower? No  Elevated toilet seat or a handicapped toilet? Yes   TIMED UP AND GO:  Was the test performed? No . Telephonic visit  Cognitive Function:        09/21/2022    8:56 AM  6CIT Screen  What Year? 0 points  What month? 0 points  What time? 0 points  Count back from 20 0 points  Months in reverse 0 points  Repeat phrase 0 points  Total Score 0 points    Immunizations Immunization History  Administered Date(s) Administered   Influenza Whole 06/27/2010, 07/28/2011   Moderna Sars-Covid-2 Vaccination 10/27/2019, 11/27/2019   Pneumococcal Conjugate-13 06/15/2014   Pneumococcal Polysaccharide-23 06/10/2013  TDAP status: Due, Education has been provided regarding the importance of this vaccine. Advised may receive this vaccine at local pharmacy or Health Dept. Aware to provide a copy of the vaccination record if obtained from local pharmacy or Health Dept. Verbalized acceptance and understanding.  Flu Vaccine status: Declined, Education has been provided regarding the importance of this vaccine but patient still declined. Advised may receive this vaccine at local pharmacy or Health Dept. Aware to provide a copy of the vaccination record if obtained from local pharmacy or Health Dept. Verbalized acceptance and understanding.  Pneumococcal vaccine status: Up to date  Covid-19 vaccine status: Information provided on how to obtain vaccines.   Qualifies for Shingles Vaccine? Yes   Zostavax completed No   Shingrix Completed?: No.    Education has been provided regarding the importance of this vaccine. Patient has been advised to call insurance company to determine out of pocket expense if they have not yet received this vaccine.  Advised may also receive vaccine at local pharmacy or Health Dept. Verbalized acceptance and understanding.  Screening Tests Health Maintenance  Topic Date Due   MAMMOGRAM  07/19/2021   PAP SMEAR-Modifier  06/08/2022   Zoster Vaccines- Shingrix (1 of 2) 12/21/2022 (Originally 10/06/1979)   COLONOSCOPY (Pts 45-17yr Insurance coverage will need to be confirmed)  01/03/2029   Hepatitis C Screening  Completed   HIV Screening  Completed   HPV VACCINES  Aged Out   DTaP/Tdap/Td  Discontinued   INFLUENZA VACCINE  Discontinued   COVID-19 Vaccine  Discontinued    Health Maintenance  Health Maintenance Due  Topic Date Due   MAMMOGRAM  07/19/2021   PAP SMEAR-Modifier  06/08/2022    Colorectal cancer screening: Type of screening: Colonoscopy. Completed 01/03/22. Repeat every 7 years  Mammogram status: Ordered and scheduled for 09/27/22. Pt provided with contact info and advised to call to schedule appt.   Lung Cancer Screening: (Low Dose CT Chest recommended if Age 11041-80years, 30 pack-year currently smoking OR have quit w/in 15years.) does not qualify.   Lung Cancer Screening Referral: n/a   Additional Screening:  Hepatitis C Screening: does qualify; Completed 02/20/18  Vision Screening: Recommended annual ophthalmology exams for early detection of glaucoma and other disorders of the eye. Is the patient up to date with their annual eye exam?  Yes  Who is the provider or what is the name of the office in which the patient attends annual eye exams? Unable to provide name  If pt is not established with a provider, would they like to be referred to a provider to establish care? No .   Dental Screening: Recommended annual dental exams for proper oral hygiene  Community Resource Referral / Chronic Care Management: CRR required this visit?  No   CCM required this visit?  No      Plan:     I have personally reviewed and noted the following in the patient's chart:   Medical and social  history Use of alcohol, tobacco or illicit drugs  Current medications and supplements including opioid prescriptions. Patient is currently taking opioid prescriptions. Information provided to patient regarding non-opioid alternatives. Patient advised to discuss non-opioid treatment plan with their provider. Functional ability and status Nutritional status Physical activity Advanced directives List of other physicians Hospitalizations, surgeries, and ER visits in previous 12 months Vitals Screenings to include cognitive, depression, and falls Referrals and appointments  In addition, I have reviewed and discussed with patient certain preventive protocols, quality metrics, and best  practice recommendations. A written personalized care plan for preventive services as well as general preventive health recommendations were provided to patient.     Vanetta Mulders, [place]   5/33/9179   Due to this being a virtual visit, the after visit summary with patients personalized plan was offered to patient via mail or my-chart. Patient would like to access on my-chart  Nurse Notes: No concerns

## 2022-09-21 ENCOUNTER — Other Ambulatory Visit (HOSPITAL_COMMUNITY): Payer: Self-pay

## 2022-09-21 ENCOUNTER — Ambulatory Visit (INDEPENDENT_AMBULATORY_CARE_PROVIDER_SITE_OTHER): Payer: Self-pay

## 2022-09-21 VITALS — Ht 68.75 in | Wt 261.0 lb

## 2022-09-21 DIAGNOSIS — Z0000 Encounter for general adult medical examination without abnormal findings: Secondary | ICD-10-CM

## 2022-09-24 ENCOUNTER — Other Ambulatory Visit (HOSPITAL_COMMUNITY): Payer: Self-pay

## 2022-09-24 ENCOUNTER — Telehealth: Payer: Self-pay | Admitting: Family Medicine

## 2022-09-24 ENCOUNTER — Telehealth: Payer: Self-pay

## 2022-09-24 NOTE — Telephone Encounter (Signed)
Kathleen [name] calling looking for appt she said her mom has RSV and last night she went to the store and she said her oxygen level dropped to 82. While speaking to the patient her oxygen level was normal. She said she has a headache and little congestion.   Jenny Lai number is [phone]

## 2022-09-24 NOTE — Telephone Encounter (Signed)
[facility] calling to get approval to fill February pain script. 7 day supply was filed a few days ago and PA was completed. When pharmacy runs the remainder it is saying insurance will not pay for it. Please advise. Thank you.   [place]

## 2022-09-24 NOTE — Telephone Encounter (Signed)
I read over the messages If she is concerned about having sickness we would have to schedule an appointment Otherwise I believe what is happening is she just needs oxygen when she is active such as walking Would need to schedule her somewhere this week or next week to do a oxygen test where we check her level on room air and then check it again with activity and then check it again with 2 L of nasal oxygen  (Does patient have O2 bottles that she can take with her outside of the house?)

## 2022-09-24 NOTE — Telephone Encounter (Signed)
Pt contacted. Pt states no shortness of breath or trouble breathing at this time. Last night at the store oxygen dropped to 82. Wears at night all time and prn during the day. O2 drops with exertion per patient. Please advise. Thank you

## 2022-09-25 NOTE — Telephone Encounter (Signed)
Pt contacted and states that mom has RSV, Pneumonia and possibly a heart attack. Pt verbalized understanding regarding possibly needing oxygen with activity. Pt verbalized understanding. Appt set up for 09/26/22

## 2022-09-26 ENCOUNTER — Encounter: Payer: Self-pay | Admitting: Family Medicine

## 2022-09-26 ENCOUNTER — Ambulatory Visit (INDEPENDENT_AMBULATORY_CARE_PROVIDER_SITE_OTHER): Payer: Commercial Managed Care - PPO | Admitting: Family Medicine

## 2022-09-26 VITALS — BP 101/61 | HR 81 | Wt 258.0 lb

## 2022-09-26 DIAGNOSIS — R0609 Other forms of dyspnea: Secondary | ICD-10-CM | POA: Diagnosis not present

## 2022-09-26 DIAGNOSIS — R051 Acute cough: Secondary | ICD-10-CM

## 2022-09-26 DIAGNOSIS — E2740 Unspecified adrenocortical insufficiency: Secondary | ICD-10-CM | POA: Diagnosis not present

## 2022-09-26 MED ORDER — PREDNISONE 20 MG PO TABS: ORAL_TABLET | ORAL | 0 refills | Status: DC

## 2022-09-26 NOTE — Progress Notes (Signed)
   Subjective:    Patient ID: Kathleen [name], female    DOB: [birth year], 62 y.o.   MRN: [id number]  HPI Pt arrives for cough and congestion. Has been going on a few days. Pt has been in contact with someone with RSV. Mother has RSV and Pneumonia. About one week ago, pt began to become short of breath with activity  Other daily Start not feeling well limited this can get high when he first noticed right right  Review of Systems     Objective:   Physical Exam  Gen-NAD not toxic TMS-normal bilateral T- normal no redness Chest-CTA respiratory rate normal no crackles CV RRR no murmur Skin-warm dry Neuro-grossly normal Patient not in respiratory distress.  Does have underlying lung condition Recent exposure to RSV with coughing and some shortness of breath O2 saturation good      Assessment & Plan:  Triple swab taken Chest x-ray ordered Low-dose prednisone for 5 days Hold on antibiotics currently

## 2022-09-26 NOTE — Telephone Encounter (Signed)
Pharmacy was able to fill rest of meds

## 2022-09-27 ENCOUNTER — Ambulatory Visit (HOSPITAL_COMMUNITY): Admission: RE | Admit: 2022-09-27 | Payer: HMO | Source: Ambulatory Visit

## 2022-09-27 ENCOUNTER — Ambulatory Visit (HOSPITAL_COMMUNITY)
Admission: RE | Admit: 2022-09-27 | Discharge: 2022-09-27 | Disposition: A | Discharge status: Home / Self Care | Payer: HMO | Source: Ambulatory Visit | Attending: Family Medicine | Admitting: Family Medicine

## 2022-09-27 DIAGNOSIS — N644 Mastodynia: Secondary | ICD-10-CM | POA: Diagnosis not present

## 2022-09-27 DIAGNOSIS — R051 Acute cough: Secondary | ICD-10-CM | POA: Insufficient documentation

## 2022-09-27 DIAGNOSIS — R0609 Other forms of dyspnea: Secondary | ICD-10-CM | POA: Insufficient documentation

## 2022-09-27 LAB — COMPREHENSIVE METABOLIC PANEL
ALT: 14 IU/L (ref 0–32)
AST: 16 IU/L (ref 0–40)
Albumin/Globulin Ratio: 1.9 (ref 1.2–2.2)
Albumin: 4.3 g/dL (ref 3.8–4.9)
Alkaline Phosphatase: 77 IU/L (ref 44–121)
BUN/Creatinine Ratio: 13 (ref 9–23)
BUN: 12 mg/dL (ref 6–24)
Bilirubin Total: 0.3 mg/dL (ref 0.0–1.2)
CO2: 25 mmol/L (ref 20–29)
Calcium: 9.6 mg/dL (ref 8.7–10.2)
Chloride: 100 mmol/L (ref 96–106)
Creatinine, Ser: 0.89 mg/dL (ref 0.57–1.00)
Globulin, Total: 2.3 g/dL (ref 1.5–4.5)
Glucose: 95 mg/dL (ref 70–99)
Potassium: 4.5 mmol/L (ref 3.5–5.2)
Sodium: 141 mmol/L (ref 134–144)
Total Protein: 6.6 g/dL (ref 6.0–8.5)
eGFR: 75 mL/min/{1.73_m2} (ref >59)

## 2022-09-27 LAB — TSH: TSH: 0.715 u[IU]/mL (ref 0.450–4.500)

## 2022-09-27 LAB — LIPID PANEL
Chol/HDL Ratio: 2.6 ratio (ref 0.0–4.4)
Cholesterol, Total: 184 mg/dL (ref 100–199)
HDL: 70 mg/dL (ref >39)
LDL Chol Calc (NIH): 84 mg/dL (ref 0–99)
Triglycerides: 178 mg/dL — ABNORMAL HIGH (ref 0–149)
VLDL Cholesterol Cal: 30 mg/dL (ref 5–40)

## 2022-09-27 LAB — COVID-19, FLU A+B AND RSV
Influenza A, NAA: NOT DETECTED
Influenza B, NAA: NOT DETECTED
RSV, NAA: NOT DETECTED
SARS-CoV-2, NAA: NOT DETECTED

## 2022-09-27 LAB — T4, FREE: Free T4: 1.11 ng/dL (ref 0.82–1.77)

## 2022-10-04 ENCOUNTER — Other Ambulatory Visit (HOSPITAL_COMMUNITY): Payer: Self-pay

## 2022-10-05 ENCOUNTER — Other Ambulatory Visit: Payer: Self-pay | Admitting: Family Medicine

## 2022-10-05 ENCOUNTER — Other Ambulatory Visit (HOSPITAL_COMMUNITY): Payer: Self-pay

## 2022-10-08 ENCOUNTER — Ambulatory Visit: Payer: Medicare Other | Admitting: Family Medicine

## 2022-10-08 ENCOUNTER — Other Ambulatory Visit (HOSPITAL_COMMUNITY): Payer: Self-pay

## 2022-10-08 ENCOUNTER — Other Ambulatory Visit: Payer: Self-pay

## 2022-10-08 MED ORDER — HYDROXYZINE HCL 25 MG PO TABS
25.0000 mg | ORAL_TABLET | Freq: Three times a day (TID) | ORAL | 1 refills | Status: DC | PRN
  Filled 2022-10-08: qty 90, 30d supply, fill #0
  Filled 2022-11-01: qty 90, 30d supply, fill #1

## 2022-10-15 ENCOUNTER — Other Ambulatory Visit: Payer: Self-pay

## 2022-10-15 ENCOUNTER — Other Ambulatory Visit (HOSPITAL_COMMUNITY): Payer: Self-pay

## 2022-10-22 ENCOUNTER — Encounter (HOSPITAL_BASED_OUTPATIENT_CLINIC_OR_DEPARTMENT_OTHER): Payer: Self-pay

## 2022-10-22 ENCOUNTER — Emergency Department (HOSPITAL_BASED_OUTPATIENT_CLINIC_OR_DEPARTMENT_OTHER)
Admission: EM | Admit: 2022-10-22 | Discharge: 2022-10-23 | Disposition: A | Discharge status: Home / Self Care | Payer: HMO | Attending: Emergency Medicine | Admitting: Emergency Medicine

## 2022-10-22 ENCOUNTER — Emergency Department (HOSPITAL_BASED_OUTPATIENT_CLINIC_OR_DEPARTMENT_OTHER): Payer: HMO | Admitting: Radiology

## 2022-10-22 ENCOUNTER — Other Ambulatory Visit: Payer: Self-pay

## 2022-10-22 ENCOUNTER — Emergency Department (HOSPITAL_BASED_OUTPATIENT_CLINIC_OR_DEPARTMENT_OTHER): Payer: HMO

## 2022-10-22 DIAGNOSIS — G8929 Other chronic pain: Secondary | ICD-10-CM | POA: Diagnosis not present

## 2022-10-22 DIAGNOSIS — R112 Nausea with vomiting, unspecified: Secondary | ICD-10-CM | POA: Insufficient documentation

## 2022-10-22 DIAGNOSIS — R072 Precordial pain: Secondary | ICD-10-CM | POA: Insufficient documentation

## 2022-10-22 DIAGNOSIS — R1084 Generalized abdominal pain: Secondary | ICD-10-CM | POA: Diagnosis not present

## 2022-10-22 DIAGNOSIS — R1013 Epigastric pain: Secondary | ICD-10-CM | POA: Diagnosis not present

## 2022-10-22 DIAGNOSIS — R079 Chest pain, unspecified: Secondary | ICD-10-CM

## 2022-10-22 DIAGNOSIS — R0789 Other chest pain: Secondary | ICD-10-CM | POA: Diagnosis not present

## 2022-10-22 LAB — HEPATIC FUNCTION PANEL
ALT: 13 U/L (ref 0–44)
AST: 12 U/L — ABNORMAL LOW (ref 15–41)
Albumin: 4.4 g/dL (ref 3.5–5.0)
Alkaline Phosphatase: 60 U/L (ref 38–126)
Bilirubin, Direct: 0.1 mg/dL (ref 0.0–0.2)
Indirect Bilirubin: 0.2 mg/dL — ABNORMAL LOW (ref 0.3–0.9)
Total Bilirubin: 0.3 mg/dL (ref 0.3–1.2)
Total Protein: 7.3 g/dL (ref 6.5–8.1)

## 2022-10-22 LAB — BASIC METABOLIC PANEL
Anion gap: 7 (ref 5–15)
BUN: 14 mg/dL (ref 6–20)
CO2: 34 mmol/L — ABNORMAL HIGH (ref 22–32)
Calcium: 9.7 mg/dL (ref 8.9–10.3)
Chloride: 96 mmol/L — ABNORMAL LOW (ref 98–111)
Creatinine, Ser: 1.06 mg/dL — ABNORMAL HIGH (ref 0.44–1.00)
GFR, Estimated: >60 mL/min (ref >60)
Glucose, Bld: 102 mg/dL — ABNORMAL HIGH (ref 70–99)
Potassium: 4 mmol/L (ref 3.5–5.1)
Sodium: 137 mmol/L (ref 135–145)

## 2022-10-22 LAB — CBC
HCT: 40.1 % (ref 36.0–46.0)
Hemoglobin: 12.5 g/dL (ref 12.0–15.0)
MCH: 28.3 pg (ref 26.0–34.0)
MCHC: 31.2 g/dL (ref 30.0–36.0)
MCV: 90.9 fL (ref 80.0–100.0)
Platelets: 399 10*3/uL (ref 150–400)
RBC: 4.41 MIL/uL (ref 3.87–5.11)
RDW: 14.1 % (ref 11.5–15.5)
WBC: 11.8 10*3/uL — ABNORMAL HIGH (ref 4.0–10.5)
nRBC: 0 % (ref 0.0–0.2)

## 2022-10-22 LAB — LIPASE, BLOOD: Lipase: 52 U/L — ABNORMAL HIGH (ref 11–51)

## 2022-10-22 LAB — TROPONIN I (HIGH SENSITIVITY)
Troponin I (High Sensitivity): 2 ng/L (ref <18)
Troponin I (High Sensitivity): 3 ng/L (ref <18)

## 2022-10-22 MED ORDER — PROMETHAZINE HCL 25 MG/ML IJ SOLN
INTRAMUSCULAR | Status: AC
  Filled 2022-10-22: qty 1

## 2022-10-22 MED ORDER — PANTOPRAZOLE SODIUM 40 MG IV SOLR
40.0000 mg | Freq: Once | INTRAVENOUS | Status: AC
  Administered 2022-10-22: 40 mg via INTRAVENOUS
  Filled 2022-10-22: qty 10

## 2022-10-22 MED ORDER — HYDROCORTISONE SOD SUC (PF) 100 MG IJ SOLR
100.0000 mg | Freq: Once | INTRAMUSCULAR | Status: AC
  Administered 2022-10-22: 100 mg via INTRAVENOUS
  Filled 2022-10-22: qty 2

## 2022-10-22 MED ORDER — ONDANSETRON HCL 4 MG/2ML IJ SOLN
4.0000 mg | Freq: Once | INTRAMUSCULAR | Status: AC
  Administered 2022-10-22: 4 mg via INTRAVENOUS
  Filled 2022-10-22: qty 2

## 2022-10-22 MED ORDER — FENTANYL CITRATE PF 50 MCG/ML IJ SOSY
100.0000 ug | PREFILLED_SYRINGE | Freq: Once | INTRAMUSCULAR | Status: AC
  Administered 2022-10-22: 100 ug via INTRAVENOUS
  Filled 2022-10-22: qty 2

## 2022-10-22 MED ORDER — HYDROMORPHONE HCL 1 MG/ML IJ SOLN
1.0000 mg | Freq: Once | INTRAMUSCULAR | Status: DC
  Filled 2022-10-22: qty 1

## 2022-10-22 MED ORDER — SODIUM CHLORIDE 0.9 % IV BOLUS
1000.0000 mL | Freq: Once | INTRAVENOUS | Status: AC
  Administered 2022-10-22: 1000 mL via INTRAVENOUS

## 2022-10-22 MED ORDER — SODIUM CHLORIDE 0.9 % IV SOLN
12.5000 mg | Freq: Once | INTRAVENOUS | Status: AC
  Administered 2022-10-22: 12.5 mg via INTRAVENOUS
  Filled 2022-10-22: qty 0.5

## 2022-10-22 NOTE — ED Provider Notes (Signed)
[facility] Provider Note   CSN: [id number] Arrival date & time: 10/22/22  2015     History {Add pertinent medical, surgical, social history, OB history to HPI:1} Chief Complaint  Patient presents with   Chest Pain    Kathleen [name] is a 62 y.o. female.  She is here with a complaint of chest pain and abdominal pain.  She said the chest pain started around 3 PM while she was resting on the couch.  She also has abdominal pain it has been going on for over a year associated with nausea.  Patient states blood pressure was low and has not been eating or drinking very much.  She said she is on chronic pain medicine and had a normal bowel movement but usually is constipated.  No fevers or chills.  No urinary symptoms.  No prior history of cardiac disease.  The history is provided by the patient.  Chest Pain Pain location:  Substernal area Pain quality: aching   Pain severity:  Moderate Onset quality:  Gradual Duration:  6 hours Progression:  Partially resolved Chronicity:  New Context: at rest   Relieved by:  None tried Worsened by:  Nothing Ineffective treatments:  None tried Associated symptoms: abdominal pain and nausea   Associated symptoms: no cough, no diaphoresis and no fever        Home Medications Prior to Admission medications   Medication Sig Start Date End Date Taking? Authorizing Provider  acetaminophen (TYLENOL) 500 MG tablet Take 1,000 mg by mouth every 8 (eight) hours as needed for headache.    [provider]  albuterol (VENTOLIN HFA) 108 (90 Base) MCG/ACT inhaler Inhale 2 puffs into the lungs every 6 (six) hours as needed for wheezing or shortness of breath. 05/22/21   Roxan Hockey, MD  ascorbic acid (VITAMIN C) 500 MG tablet Take 1 tablet (500 mg total) by mouth daily. 05/23/21   Roxan Hockey, MD  budesonide (PULMICORT) 0.5 MG/2ML nebulizer solution Inhale 2 mL (0.5 mg total) by nebulization two (2) times  a day as needed. 06/08/21     budesonide (PULMICORT) 0.5 MG/2ML nebulizer solution Inhale the contents of 1 vial via nebulizer two (2) times a day. 11/22/21     EPINEPHrine 0.3 mg/0.3 mL IJ SOAJ injection Inject 0.3 mg into the muscle as needed for anaphylaxis. 01/05/22   [name], MD  escitalopram (LEXAPRO) 10 MG tablet Take 1 tablet (10 mg total) by mouth daily. 07/30/22   [name], MD  ferrous sulfate 325 (65 FE) MG EC tablet Take 325 mg by mouth as needed.    [provider]  formoterol (PERFOROMIST) 20 MCG/2ML nebulizer solution Inhale the contents of 1 vial via nebulizer two (2) times a day. 11/22/21     hydrocortisone (CORTEF) 10 MG tablet Take 2 tablets by mouth at 8:00 am and 1 tablet at lunch. (May double dose on sick days x 3-5 days) 07/25/22   [name], MD  hydrOXYzine (ATARAX) 25 MG tablet Take 1 tablet (25 mg total) by mouth 3 (three) times daily as needed for anxiety 10/08/22 10/08/23  [name], MD  levothyroxine (SYNTHROID) 50 MCG tablet Take 1 tablet by mouth daily before breakfast. 07/04/22 01/01/23  [name], MD  linaclotide [name]) 290 MCG CAPS capsule Take 1 capsule (290 mcg total) by mouth daily before breakfast. 03/28/22   [name], MD  mupirocin ointment (BACTROBAN) 2 % Apply in right nostril  twice daily 08/22/22   [name], MD  ondansetron (ZOFRAN-ODT) 4 MG disintegrating tablet Take 1 tablet (4 mg total) by mouth every 8 (eight) hours as needed for nausea or vomiting. 03/02/22   Lucrezia Starch, MD  ondansetron (ZOFRAN-ODT) 8 MG disintegrating tablet Dissolve 1 tablet (8 mg total) by mouth every 8 (eight) hours as needed. 07/04/22 07/04/23  [name], MD  Oxycodone HCl 10 MG TABS Take 1 tablet (10 mg total) by mouth every 4 (four) hours as needed for pain. Max of 6 tablets daily. 09/10/22   [name], MD  Oxycodone HCl 10 MG TABS Take 1 tablet (10 mg total) by mouth every 4 (four) hours as needed for pain  (max 6 tablets daily).Fill 08/31/22 09/10/22   [name], MD  Oxycodone HCl 10 MG TABS Take 1 tablet (10 mg total) by mouth every 4 (four) hours as needed for pain (max 6 tabs daily) 09/10/22   Wolfgang Phoenix, Elayne Snare, MD  pantoprazole (PROTONIX) 40 MG tablet Take 1 tablet by mouth 2 times daily before a meal. 11/07/21 11/07/22  [name], [name], MD  predniSONE (DELTASONE) 20 MG tablet 2 qd for 5d 09/26/22   [name], MD  predniSONE (DELTASONE) 5 MG tablet Take 1 tablet (5 mg total) by mouth in the morning. 07/04/22     rosuvastatin (CRESTOR) 10 MG tablet Take 1 tablet (10 mg total) by mouth daily. 07/03/22   [name], MD  sucralfate (CARAFATE) 1 GM/10ML suspension Take 10 mLs (1 g total) by mouth 4 (four) times daily. 01/16/22   Rogene Houston, MD      Allergies    Bee venom, Contrast media [iodinated contrast media], Lactose intolerance (gi), Bactrim [sulfamethoxazole-trimethoprim], Codeine, and Morphine and related    Review of Systems   Review of Systems  Constitutional:  Negative for diaphoresis and fever.  Respiratory:  Negative for cough.   Cardiovascular:  Positive for chest pain.  Gastrointestinal:  Positive for abdominal pain and nausea.  Genitourinary:  Negative for dysuria.    Physical Exam Updated Vital Signs BP (!) 106/55 (BP Location: Right Arm)   Pulse 82   Temp 98.4 F (36.9 C) (Oral)   Resp 16   Ht '5\' 10"'$  (1.778 m)   Wt 108.9 kg   LMP 05/17/2016   SpO2 94%   BMI 34.44 kg/m  Physical Exam Vitals and nursing note reviewed.  Constitutional:      General: She is not in acute distress.    Appearance: She is well-developed. She is obese.  HENT:     Head: Normocephalic and atraumatic.  Eyes:     Conjunctiva/sclera: Conjunctivae normal.  Cardiovascular:     Rate and Rhythm: Normal rate and regular rhythm.     Heart sounds: Normal heart sounds. No murmur heard. Pulmonary:     Effort: Pulmonary effort is normal. No respiratory distress.     Breath sounds:  Normal breath sounds.  Abdominal:     Palpations: Abdomen is soft. There is no mass.     Tenderness: There is no abdominal tenderness. There is no guarding or rebound.  Musculoskeletal:        General: No swelling.     Cervical back: Neck supple.     Right lower leg: No tenderness.     Left lower leg: No tenderness.  Skin:    General: Skin is warm and dry.     Capillary Refill: Capillary refill takes less than  2 seconds.  Neurological:     General: No focal deficit present.     Mental Status: She is alert.     ED Results / Procedures / Treatments   Labs (all labs ordered are listed, but only abnormal results are displayed) Labs Reviewed  CBC - Abnormal; Notable for the following components:      Result Value   WBC 11.8 (*)    All other components within normal limits  BASIC METABOLIC PANEL  PREGNANCY, URINE  TROPONIN I (HIGH SENSITIVITY)    EKG EKG Interpretation  Date/Time:  Monday October 22 2022 20:29:43 EST Ventricular Rate:  79 PR Interval:  124 QRS Duration: 76 QT Interval:  350 QTC Calculation: 401 R Axis:   47 Text Interpretation: Normal sinus rhythm Nonspecific T wave abnormality Abnormal ECG When compared with ECG of 17-Dec-2021 21:54, No significant change was found Confirmed by Aletta Edouard [phone]) on 10/22/2022 8:32:10 PM  Radiology No results found.  Procedures Procedures  {Document cardiac monitor, telemetry assessment procedure when appropriate:1}  Medications Ordered in ED Medications  sodium chloride 0.9 % bolus 1,000 mL (has no administration in time range)  ondansetron (ZOFRAN) injection 4 mg (has no administration in time range)  HYDROmorphone (DILAUDID) injection 1 mg (has no administration in time range)    ED Course/ Medical Decision Making/ A&P   {   Click here for ABCD2, HEART and other calculatorsREFRESH Note before signing :1}                          Medical Decision Making Amount and/or Complexity of Data Reviewed Labs:  ordered. Radiology: ordered.  Risk Prescription drug management.   This patient complains of ***; this involves an extensive number of treatment Options and is a complaint that carries with it a high risk of complications and morbidity. The differential includes ***  I ordered, reviewed and interpreted labs, which included *** I ordered medication *** and reviewed PMP when indicated. I ordered imaging studies which included *** and I independently    visualized and interpreted imaging which showed *** Additional history obtained from *** Previous records obtained and reviewed *** I consulted *** and discussed lab and imaging findings and discussed disposition.  Cardiac monitoring reviewed, *** Social determinants considered, *** Critical Interventions: ***  After the interventions stated above, I reevaluated the patient and found *** Admission and further testing considered, ***   {Document critical care time when appropriate:1} {Document review of labs and clinical decision tools ie heart score, Chads2Vasc2 etc:1}  {Document your independent review of radiology images, and any outside records:1} {Document your discussion with family members, caretakers, and with consultants:1} {Document social determinants of health affecting pt's care:1} {Document your decision making why or why not admission, treatments were needed:1} Final Clinical Impression(s) / ED Diagnoses Final diagnoses:  None    Rx / DC Orders ED Discharge Orders     None

## 2022-10-22 NOTE — ED Triage Notes (Signed)
Pt arrives POV from home with c/o chest pain under left breast that started earlier this evening.  Pt also reports abdominal pain for a year but started feeling nauseous and lightheaded earlier this evening.    Pt BIB WC, seems uncomfortable in triage.

## 2022-10-22 NOTE — ED Notes (Signed)
Patient still nauseated and with pain after medications. MD notified.

## 2022-10-22 NOTE — ED Notes (Signed)
Patient continues to vomit and complain of pain. MD notified.

## 2022-10-22 NOTE — ED Notes (Signed)
Pt. returned from XR. 

## 2022-10-23 ENCOUNTER — Other Ambulatory Visit (HOSPITAL_COMMUNITY): Payer: Self-pay

## 2022-10-23 ENCOUNTER — Other Ambulatory Visit: Payer: Self-pay

## 2022-10-23 MED ORDER — HALOPERIDOL LACTATE 5 MG/ML IJ SOLN
5.0000 mg | Freq: Once | INTRAMUSCULAR | Status: AC
  Administered 2022-10-23: 5 mg via INTRAVENOUS
  Filled 2022-10-23: qty 1

## 2022-10-23 MED ORDER — PROCHLORPERAZINE MALEATE 10 MG PO TABS
10.0000 mg | ORAL_TABLET | Freq: Three times a day (TID) | ORAL | 0 refills | Status: DC | PRN
  Filled 2022-10-23: qty 10, 4d supply, fill #0

## 2022-10-23 NOTE — ED Notes (Signed)
Reviewed AVS with patient and husband, expressed understanding of directions, denies further questions at this time.Patient still declining admission.

## 2022-10-23 NOTE — ED Provider Notes (Signed)
Care of the patient assumed at the change of shift. Here for chest pain and abdominal pain with vomiting. She reports the abdominal pain and vomiting are chronic problems for her. She had an overall negative workup and at shift change was awaiting symptom improvement. She continued to have nausea and gagging after multiple rounds of antiemetics but has not had much vomiting in the last several hours. She was offered admission vs antiemetics at home and she would prefer to go home. She was advised to advance her diet as tolerated and to follow up with PCP. RTED if symptoms worsen.    Truddie Hidden, MD 10/23/22 [phone]

## 2022-10-23 NOTE — ED Notes (Signed)
Pt attempting PO challenge. Took a few sips of water, states, "I'm gunna throw up again." No vomiting noted.

## 2022-10-23 NOTE — ED Notes (Signed)
Attempted to have patient drink a few more sips of water, pt began dry heaving. Per MD Karle Starch haldol should be administered at this time. See MAR for administration.

## 2022-10-23 NOTE — ED Notes (Signed)
Called pt's husband to pick pt up. Per MD, patient declined admission.

## 2022-10-24 ENCOUNTER — Other Ambulatory Visit (HOSPITAL_COMMUNITY): Payer: Self-pay

## 2022-10-24 ENCOUNTER — Ambulatory Visit (INDEPENDENT_AMBULATORY_CARE_PROVIDER_SITE_OTHER): Payer: HMO | Admitting: "Endocrinology

## 2022-10-24 ENCOUNTER — Encounter: Payer: Self-pay | Admitting: "Endocrinology

## 2022-10-24 ENCOUNTER — Telehealth: Payer: Self-pay

## 2022-10-24 VITALS — BP 104/68 | HR 68 | Ht 68.75 in | Wt 254.4 lb

## 2022-10-24 DIAGNOSIS — E039 Hypothyroidism, unspecified: Secondary | ICD-10-CM

## 2022-10-24 DIAGNOSIS — Z6838 Body mass index (BMI) 38.0-38.9, adult: Secondary | ICD-10-CM

## 2022-10-24 DIAGNOSIS — E2740 Unspecified adrenocortical insufficiency: Secondary | ICD-10-CM | POA: Diagnosis not present

## 2022-10-24 DIAGNOSIS — E782 Mixed hyperlipidemia: Secondary | ICD-10-CM

## 2022-10-24 DIAGNOSIS — E66812 Obesity, class 2: Secondary | ICD-10-CM

## 2022-10-24 MED ORDER — LEVOTHYROXINE SODIUM 50 MCG PO TABS
50.0000 ug | ORAL_TABLET | Freq: Every day | ORAL | 1 refills | Status: DC
  Filled 2022-10-24 – 2022-12-10 (×2): qty 90, 90d supply, fill #0
  Filled 2023-03-08: qty 90, 90d supply, fill #1

## 2022-10-24 MED ORDER — HYDROCORTISONE 10 MG PO TABS
ORAL_TABLET | ORAL | 3 refills | Status: DC
  Filled 2022-10-24: qty 120, 20d supply, fill #0
  Filled 2022-12-01: qty 120, 20d supply, fill #1
  Filled 2023-01-22: qty 120, 20d supply, fill #2
  Filled 2023-02-15: qty 120, 20d supply, fill #3

## 2022-10-24 NOTE — Progress Notes (Signed)
10/24/2022, 6:23 PM     Endocrinology follow-up note  Subjective:    Patient ID: Kathleen [name], female    DOB: Nov 20, 1960, PCP [name], MD   Past Medical History:  Diagnosis Date   Adrenal insufficiency ([place])    Anemia    Anxiety    Avascular bone necrosis ([place])    Breast fibrocystic disorder    Chronic pain    COPD (chronic obstructive pulmonary disease) ([place])    Encounter for long-term opiate analgesic use 02/02/2020   Patient has chronic pain with femoral head avascular necrosis.  Is under pain contract   Fibromyalgia    Gastroesophageal reflux disease    Hypothyroidism    Mitral valve prolapse    Orthostatic hypotension    Peripheral neuropathy    PONV (postoperative nausea and vomiting)    Restrictive lung disease    Moderate to severe   Sarcoidosis    Biopsy proven - [facility]   SOB (shortness of breath)    chronic   Past Surgical History:  Procedure Laterality Date   BIOPSY  10/17/2018   Procedure: BIOPSY;  Surgeon: Rogene Houston, MD;  [facility];  Service: Endoscopy;;  duodenum, antrum, gastric body   BIOPSY  03/03/2021   Procedure: BIOPSY;  Surgeon: Rogene Houston, MD;  [facility];  Service: Endoscopy;;   BREAST LUMPECTOMY Bilateral 01/12/2011   CHOLECYSTECTOMY N/A 04/17/2021   Procedure: LAPAROSCOPIC CHOLECYSTECTOMY;  Surgeon: Aviva Signs, MD;  Location: [facility];  Service: General;  Laterality: N/A;   COLONOSCOPY WITH PROPOFOL N/A 01/03/2022   Procedure: COLONOSCOPY WITH PROPOFOL;  Surgeon: Rogene Houston, MD;  [facility];  Service: Endoscopy;  Laterality: N/A;  220   ESOPHAGEAL DILATION N/A 03/03/2021   Procedure: ESOPHAGEAL DILATION;  Surgeon: Rogene Houston, MD;  [facility];  Service: Endoscopy;  Laterality: N/A;   ESOPHAGOGASTRODUODENOSCOPY (EGD) WITH PROPOFOL N/A 10/17/2018   Procedure: ESOPHAGOGASTRODUODENOSCOPY (EGD) WITH PROPOFOL;   Surgeon: Rogene Houston, MD;  [facility];  Service: Endoscopy;  Laterality: N/A;  2:25   ESOPHAGOGASTRODUODENOSCOPY (EGD) WITH PROPOFOL N/A 03/03/2021   Procedure: ESOPHAGOGASTRODUODENOSCOPY (EGD) WITH PROPOFOL;  Surgeon: Rogene Houston, MD;  [facility];  Service: Endoscopy;  Laterality: N/A;  12:15   POLYPECTOMY  01/03/2022   Procedure: POLYPECTOMY INTESTINAL;  Surgeon: Rogene Houston, MD;  [facility];  Service: Endoscopy;;   TUBAL LIGATION     Social History   Socioeconomic History   Marital status: Married    Spouse name: Not on file   Number of children: 1   Years of education: Not on file   Highest education level: Not on file  Occupational History   Occupation: Accounts Payable    Employer: [employer]  Tobacco Use   Smoking status: Never    Passive exposure: Never   Smokeless tobacco: Never  Vaping Use   Vaping Use: Never used  Substance and Sexual Activity   Alcohol use: No   Drug use: No   Sexual activity: Yes    Birth control/protection: Post-menopausal  Other Topics Concern   Not on file  Social History Narrative   Daily caffeine  Living with husband   Right handed   She has been on disability since 2014.    Social Determinants of Health   Financial Resource Strain: Low Risk  (09/21/2022)   Overall Financial Resource Strain (CARDIA)    Difficulty of Paying Living Expenses: Not hard at all  Food Insecurity: No Food Insecurity (09/21/2022)   Hunger Vital Sign    Worried About Running Out of Food in the Last Year: Never true    Ran Out of Food in the Last Year: Never true  Transportation Needs: No Transportation Needs (09/21/2022)   PRAPARE - Hydrologist (Medical): No    Lack of Transportation (Non-Medical): No  Physical Activity: Inactive (09/21/2022)   Exercise Vital Sign    Days of Exercise per Week: 0 days    Minutes of Exercise per Session: 0 min  Stress: No Stress Concern  Present (09/21/2022)   [place]    Feeling of Stress : Not at all  Social Connections: [place] (09/21/2022)   Social Connection and Isolation Panel [NHANES]    Frequency of Communication with Friends and Family: More than three times a week    Frequency of Social Gatherings with Friends and Family: Three times a week    Attends Religious Services: 1 to 4 times per year    Active Member of Clubs or Organizations: No    Attends Music therapist: 1 to 4 times per year    Marital Status: Married   Family History  Problem Relation Age of Onset   Cardiomyopathy Mother    Breast cancer Mother    Prostate cancer Father    Heart attack Maternal Grandmother    Heart attack Maternal Grandfather    Heart attack Paternal Grandmother    Bipolar disorder Daughter    Heart disease Maternal Aunt    Neurofibromatosis Cousin    Colon cancer Neg Hx    Outpatient Encounter Medications as of 10/24/2022  Medication Sig   acetaminophen (TYLENOL) 500 MG tablet Take 1,000 mg by mouth every 8 (eight) hours as needed for headache.   albuterol (VENTOLIN HFA) 108 (90 Base) MCG/ACT inhaler Inhale 2 puffs into the lungs every 6 (six) hours as needed for wheezing or shortness of breath.   ascorbic acid (VITAMIN C) 500 MG tablet Take 1 tablet (500 mg total) by mouth daily.   budesonide (PULMICORT) 0.5 MG/2ML nebulizer solution Inhale 2 mL (0.5 mg total) by nebulization two (2) times a day as needed.   budesonide (PULMICORT) 0.5 MG/2ML nebulizer solution Inhale the contents of 1 vial via nebulizer two (2) times a day.   EPINEPHrine 0.3 mg/0.3 mL IJ SOAJ injection Inject 0.3 mg into the muscle as needed for anaphylaxis.   escitalopram (LEXAPRO) 10 MG tablet Take 1 tablet (10 mg total) by mouth daily.   ferrous sulfate 325 (65 FE) MG EC tablet Take 325 mg by mouth as needed.   formoterol (PERFOROMIST) 20 MCG/2ML nebulizer  solution Inhale the contents of 1 vial via nebulizer two (2) times a day.   hydrocortisone (CORTEF) 10 MG tablet Take 2 tablets by mouth at 8:00 am and 1 tablet at lunch. (May double dose on sick days x 3-5 days)   hydrOXYzine (ATARAX) 25 MG tablet Take 1 tablet (25 mg total) by mouth 3 (three) times daily as needed for anxiety   levothyroxine (SYNTHROID) 50 MCG tablet Take 1 tablet by mouth daily before breakfast.  linaclotide (LINZESS) 290 MCG CAPS capsule Take 1 capsule (290 mcg total) by mouth daily before breakfast.   mupirocin ointment (BACTROBAN) 2 % Apply in right nostril twice daily   ondansetron (ZOFRAN-ODT) 4 MG disintegrating tablet Take 1 tablet (4 mg total) by mouth every 8 (eight) hours as needed for nausea or vomiting.   ondansetron (ZOFRAN-ODT) 8 MG disintegrating tablet Dissolve 1 tablet (8 mg total) by mouth every 8 (eight) hours as needed.   Oxycodone HCl 10 MG TABS Take 1 tablet (10 mg total) by mouth every 4 (four) hours as needed for pain. Max of 6 tablets daily.   Oxycodone HCl 10 MG TABS Take 1 tablet (10 mg total) by mouth every 4 (four) hours as needed for pain (max 6 tablets daily).Fill 08/31/22   Oxycodone HCl 10 MG TABS Take 1 tablet (10 mg total) by mouth every 4 (four) hours as needed for pain (max 6 tabs daily)   pantoprazole (PROTONIX) 40 MG tablet Take 1 tablet by mouth 2 times daily before a meal.   predniSONE (DELTASONE) 20 MG tablet 2 qd for 5d   predniSONE (DELTASONE) 5 MG tablet Take 1 tablet (5 mg total) by mouth in the morning.   prochlorperazine (COMPAZINE) 10 MG tablet Take 1 tablet (10 mg total) by mouth every 8 (eight) hours as needed for nausea or vomiting.   rosuvastatin (CRESTOR) 10 MG tablet Take 1 tablet (10 mg total) by mouth daily.   sucralfate (CARAFATE) 1 GM/10ML suspension Take 10 mLs (1 g total) by mouth 4 (four) times daily.   [DISCONTINUED] hydrocortisone (CORTEF) 10 MG tablet Take 2 tablets by mouth at 8:00 am and 1 tablet at lunch. (May  double dose on sick days x 3-5 days)   [DISCONTINUED] levothyroxine (SYNTHROID) 50 MCG tablet Take 1 tablet by mouth daily before breakfast.   No facility-administered encounter medications on file as of 10/24/2022.   ALLERGIES: Allergies  Allergen Reactions   Bee Venom Anaphylaxis   Contrast Media [Iodinated Contrast Media]     CT chest w/ contrast at OSH followed by SOB resolved w/ single dose steroids, never ENT swelling, intubation or pressors, has had multiple contrasted scans since   Lactose Intolerance (Gi)     GI Upset   Bactrim [Sulfamethoxazole-Trimethoprim] Nausea And Vomiting   Codeine Itching   Morphine And Related Other (See Comments)    Unknown reaction- pt gets sick, dizzy, and confused Intolerance, not an allergy, received this in the emergency department recently without a problem.    VACCINATION STATUS: Immunization History  Administered Date(s) Administered   Influenza Whole 06/27/2010, 07/28/2011   Moderna Sars-Covid-2 Vaccination 10/27/2019, 11/27/2019   Pneumococcal Conjugate-13 06/15/2014   Pneumococcal Polysaccharide-23 06/10/2013    HPI Kathleen [name] is 62 y.o. female who is being seen in follow-up in the management of adrenal insufficiency, hypothyroidism.  -She is on a stable dose of steroids including hydrocortisone 20 mg p.o. daily in the morning, 10 mg p.o. daily at noon.    She is currently on levothyroxine 50 mcg p.o. daily before breakfast. She is returning for follow-up.  She has no new complaints today.  Her previsit thyroid function tests are consistent with appropriate replacement.   Recently had to visit ER due to vomiting.  - Her history is complicated including sarcoidosis which she dealt with for more than the last 10 years.  She is on regular follow-up with her pulmonologist.  Over the years, her therapy included high-dose steroids, up to 60 mg  daily of prednisone, for the last 4-6 years.  She is currently on maintenance dose of  prednisone 10 mg p.o. daily.  For steroids induced adrenal  insufficiency, she was initiated and kept on hydrocortisone. She is currently on 20 mg p.o. every morning and 10 mg p.o. daily every noon.     -She reports better consistency taking her medications this time.     -She has significant cognitive deficit.  - The details of her diagnosis with adrenal insufficiency  is not available to review. -She is not wearing her medic alert today.  . She denies any history of tuberculosis, incarceration, nor overseas travel . -She denies family history of adrenal, pituitary dysfunction. She has family history of hypothyroidism in one sibling. - Denies any history of intra-abdominal injury. She is on hydrocodone for chronic pain and inhaled bronchodilators. She is known to have orthostatic hypotension for which she is on midodrine.     Review of Systems Limited as above.  Objective:    BP 104/68   Pulse 68   Ht 5' 8.75" (1.746 m)   Wt 254 lb 6.4 oz (115.4 kg)   LMP 05/17/2016   BMI 37.84 kg/m   Wt Readings from Last 3 Encounters:  10/24/22 254 lb 6.4 oz (115.4 kg)  10/22/22 240 lb (108.9 kg)  09/26/22 258 lb (117 kg)    Physical Exam    CMP ( most recent) CMP     Component Value Date/Time   NA 137 10/22/2022 2048   NA 141 09/26/2022 1624   K 4.0 10/22/2022 2048   CL 96 (L) 10/22/2022 2048   CO2 34 (H) 10/22/2022 2048   GLUCOSE 102 (H) 10/22/2022 2048   BUN 14 10/22/2022 2048   BUN 12 09/26/2022 1624   CREATININE 1.06 (H) 10/22/2022 2048   CREATININE 0.85 08/26/2014 0831   CALCIUM 9.7 10/22/2022 2048   PROT 7.3 10/22/2022 2048   PROT 6.6 09/26/2022 1624   ALBUMIN 4.4 10/22/2022 2048   ALBUMIN 4.3 09/26/2022 1624   AST 12 (L) 10/22/2022 2048   ALT 13 10/22/2022 2048   ALKPHOS 60 10/22/2022 2048   BILITOT 0.3 10/22/2022 2048   BILITOT 0.3 09/26/2022 1624   GFRNONAA >60 10/22/2022 2048   GFRAA 92 05/24/2020 1038     Diabetic Labs (most recent): Lab Results   Component Value Date   HGBA1C 5.4 08/04/2021   HGBA1C 5.4 02/03/2019   HGBA1C 5.0 12/31/2016     Lipid Panel ( most recent) Lipid Panel     Component Value Date/Time   CHOL 184 09/26/2022 1624   TRIG 178 (H) 09/26/2022 1624   HDL 70 09/26/2022 1624   CHOLHDL 2.6 09/26/2022 1624   CHOLHDL 4.0 08/26/2014 0831   VLDL 31 08/26/2014 0831   LDLCALC 84 09/26/2022 1624      Lab Results  Component Value Date   TSH 0.715 09/26/2022   TSH 0.668 03/16/2022   TSH 0.088 (L) 09/14/2021   TSH 0.668 05/08/2021   TSH 0.307 (L) 11/09/2020   TSH 0.548 05/24/2020   TSH 0.689 11/20/2019   TSH 0.364 (L) 06/29/2019   TSH 0.746 02/03/2019   TSH 1.290 08/07/2018   FREET4 1.11 09/26/2022   FREET4 1.08 03/16/2022   FREET4 1.71 09/14/2021   FREET4 1.06 05/08/2021   FREET4 1.32 11/09/2020   FREET4 1.46 05/24/2020   FREET4 1.25 11/20/2019   FREET4 1.43 06/29/2019   FREET4 1.32 02/03/2019   FREET4 1.38 08/09/2017      Assessment &  Plan:   1. Adrenal insufficiency ([place]) -I have reviewed her new set of labs, available records and clinically evaluated this patient. She does have multiple risk factors for adrenal insufficiency  including exposure to high-dose steroids related to her sarcoidosis, as well as opioid therapy related to diffuse pain. -  She has taken prednisone up to 60 mg daily for up to 6 years prior to her diagnosis with adrenal insufficiency.   She will not need any higher dose of steroids than what she takes. -She is on a stable dose of hydrocortisone 20 mg p.o. a.m., 10 mg p.o. daily at noon,   in addition to her prednisone 10 mg p.o. daily continued related to her pulmonary sarcoidosis.  She did not tolerate an attempt to withdraw her prednisone.     I had a lengthy discussion with her about the need to avoid any further unnecessary escalation on steroid replacement.    She is advised to remain on same steroid replacement dose and schedule.   -She will not be considered for  work-up by withdrawing steroids supports,  risk of adrenal crisis.   -She does not have enough support to perform steroid injection at home. -She is advised to go to ER if she cannot keep her medications down due to intractable vomiting. - She will be evaluated for the need for mineralocorticoid replacement which may help to stabilize her blood pressure.    - I had a long discussion with her regarding steroids sick day rules, risk of unintended weight gain/diabetes and the need for periodic screening with A1c.  -Her last A1c was 5.4% in June 2020.  She will have point-of-care A1c during her next visit.      2. Hypothyroidism:  -Her recent labs for thyroid function are consistent with appropriate replacement.  She is advised to continue levothyroxine 50 mcg p.o. daily before breakfast.    - We discussed about the correct intake of her thyroid hormone, on empty stomach at fasting, with water, separated by at least 30 minutes from breakfast and other medications,  and separated by more than 4 hours from calcium, iron, multivitamins, acid reflux medications (PPIs). -Patient is made aware of the fact that thyroid hormone replacement is needed for life, dose to be adjusted by periodic monitoring of thyroid function tests.   She has severe hyperlipidemia, responded to Crestor 10 mg p.o. nightly.  She is advised to continue same dose.  Side effects and precautions discussed with her.   -She is also good candidate for lifestyle medicine.  Also plant-based diet was discussed with her at length during her previous visit.   - I advised her  to maintain close follow up with [name], MD for primary care needs.   I spent  27  minutes in the care of the patient today including review of labs from Thyroid Function, CMP, and other relevant labs ; imaging/biopsy records (current and previous including abstractions from other facilities); face-to-face time discussing  her lab results and symptoms,  medications doses, her options of short and long term treatment based on the latest standards of care / guidelines;   and documenting the encounter.  [name]  participated in the discussions, expressed understanding, and voiced agreement with the above plans.  All questions were answered to her satisfaction. she is encouraged to contact clinic should she have any questions or concerns prior to her return visit.   Follow up plan: Return in about 6 months (around 04/24/2023) for  Fasting Labs  in AM B4 8.   Glade Lloyd, MD [facility] [address] [place], [place] [address] Phone: [phone]  Fax: [phone]     10/24/2022, 6:23 PM  This note was partially dictated with voice recognition software. Similar sounding words can be transcribed inadequately or may not  be corrected upon review.

## 2022-10-24 NOTE — Transitions of Care (Post Inpatient/ED Visit) (Signed)
   10/24/2022  Name: Kathleen [name] MRN: [id number] DOB: 05-13-1961  Today's TOC FU Call Status: Today's TOC FU Call Status:: Successful TOC FU Call Competed TOC FU Call Complete Date: 10/24/22  Transition Care Management Follow-up Telephone Call Date of Discharge: 10/23/22 Discharge Facility: [facility] ([facility]) Type of Discharge: Emergency Department Reason for ED Visit: Other: (chest pain vomiting) How have you been since you were released from the hospital?: Better Any questions or concerns?: Yes Patient Questions/Concerns:: still vomiting Patient Questions/Concerns Addressed: Notified Provider of Patient Questions/Concerns  Items Reviewed: Did you receive and understand the discharge instructions provided?: Yes Medications obtained and verified?: Yes (Medications Reviewed) Any new allergies since your discharge?: No Dietary orders reviewed?: NA Do you have support at home?: Yes People in Home: spouse  Home Care and Equipment/Supplies: [place] Ordered?: NA Any new equipment or medical supplies ordered?: NA  Functional Questionnaire: Do you need assistance with bathing/showering or dressing?: No Do you need assistance with meal preparation?: No Do you need assistance with eating?: No Do you have difficulty maintaining continence: No Do you need assistance with getting out of bed/getting out of a chair/moving?: No Do you have difficulty managing or taking your medications?: No  Folllow up appointments reviewed: PCP Follow-up appointment confirmed?: No (no avail appt times) MD Provider Line [phone] Given: No [facility] Follow-up appointment confirmed?: NA Do you need transportation to your follow-up appointment?: No Do you understand care options if your condition(s) worsen?: Yes-patient verbalized understanding    [place], [place] Direct Dial [phone]

## 2022-10-24 NOTE — Patient Instructions (Signed)

## 2022-10-25 ENCOUNTER — Other Ambulatory Visit (HOSPITAL_COMMUNITY): Payer: Self-pay

## 2022-10-29 ENCOUNTER — Other Ambulatory Visit (HOSPITAL_COMMUNITY): Payer: Self-pay

## 2022-11-01 ENCOUNTER — Other Ambulatory Visit (HOSPITAL_COMMUNITY): Payer: Self-pay

## 2022-11-08 ENCOUNTER — Other Ambulatory Visit: Payer: Self-pay | Admitting: Family Medicine

## 2022-11-08 ENCOUNTER — Other Ambulatory Visit (HOSPITAL_COMMUNITY): Payer: Self-pay

## 2022-11-08 ENCOUNTER — Other Ambulatory Visit (INDEPENDENT_AMBULATORY_CARE_PROVIDER_SITE_OTHER): Payer: Self-pay | Admitting: Family Medicine

## 2022-11-08 MED ORDER — PANTOPRAZOLE SODIUM 40 MG PO TBEC
40.0000 mg | DELAYED_RELEASE_TABLET | Freq: Two times a day (BID) | ORAL | 5 refills | Status: DC
  Filled 2022-11-08: qty 60, 30d supply, fill #0

## 2022-11-09 ENCOUNTER — Other Ambulatory Visit (HOSPITAL_COMMUNITY): Payer: Self-pay

## 2022-11-12 ENCOUNTER — Other Ambulatory Visit (HOSPITAL_COMMUNITY): Payer: Self-pay

## 2022-11-12 ENCOUNTER — Other Ambulatory Visit: Payer: Self-pay | Admitting: Family Medicine

## 2022-11-13 ENCOUNTER — Other Ambulatory Visit: Payer: Self-pay

## 2022-11-13 ENCOUNTER — Other Ambulatory Visit (HOSPITAL_COMMUNITY): Payer: Self-pay

## 2022-11-23 ENCOUNTER — Other Ambulatory Visit (HOSPITAL_COMMUNITY): Payer: Self-pay

## 2022-11-23 ENCOUNTER — Other Ambulatory Visit: Payer: Self-pay | Admitting: Family Medicine

## 2022-11-26 ENCOUNTER — Other Ambulatory Visit (HOSPITAL_COMMUNITY): Payer: Self-pay

## 2022-11-27 ENCOUNTER — Other Ambulatory Visit (HOSPITAL_COMMUNITY): Payer: Self-pay

## 2022-11-27 MED ORDER — HYDROXYZINE HCL 25 MG PO TABS
25.0000 mg | ORAL_TABLET | Freq: Three times a day (TID) | ORAL | 1 refills | Status: DC | PRN
  Filled 2022-11-27: qty 90, 30d supply, fill #0

## 2022-11-29 ENCOUNTER — Ambulatory Visit: Payer: Medicare Other | Admitting: Family Medicine

## 2022-12-01 ENCOUNTER — Other Ambulatory Visit (HOSPITAL_COMMUNITY): Payer: Self-pay

## 2022-12-10 ENCOUNTER — Other Ambulatory Visit (HOSPITAL_COMMUNITY): Payer: Self-pay

## 2022-12-11 ENCOUNTER — Other Ambulatory Visit (HOSPITAL_COMMUNITY): Payer: Self-pay

## 2022-12-11 ENCOUNTER — Telehealth: Payer: Self-pay

## 2022-12-11 ENCOUNTER — Other Ambulatory Visit: Payer: Self-pay | Admitting: Family Medicine

## 2022-12-11 ENCOUNTER — Ambulatory Visit: Payer: Commercial Managed Care - PPO | Admitting: Family Medicine

## 2022-12-11 MED ORDER — DOXYCYCLINE HYCLATE 100 MG PO TABS
100.0000 mg | ORAL_TABLET | Freq: Two times a day (BID) | ORAL | 0 refills | Status: DC
  Filled 2022-12-11: qty 20, 10d supply, fill #0

## 2022-12-11 MED ORDER — OXYCODONE HCL 10 MG PO TABS
10.0000 mg | ORAL_TABLET | ORAL | 0 refills | Status: DC | PRN
  Filled 2022-12-11 – 2022-12-12 (×2): qty 180, 30d supply, fill #0

## 2022-12-11 NOTE — Telephone Encounter (Signed)
Rx sent to [facility] as requested

## 2022-12-11 NOTE — Telephone Encounter (Signed)
Prescription Request  12/11/2022  LOV: Visit date not found  What is the name of the medication or equipment? Oxycodone HCl 10 MG TABS   Have you contacted your pharmacy to request a refill? Yes   Which pharmacy would you like this sent to? [facility]  just for this medication pt has rescheduled appt till Tuesday where Dr Lorin Picket is out   Patient notified that their request is being sent to the clinical staff for review and that they should receive a response within 2 business days.   Please advise at Mobile [phone] (mobile)

## 2022-12-11 NOTE — Telephone Encounter (Signed)
Prescription Request  12/11/2022  LOV: Visit date not found  What is the name of the medication or equipment? doxycycline (VIBRA-TABS) 100 MG tablet   Have you contacted your pharmacy to request a refill? Yes   Which pharmacy would you like this sent to? [facility]  Please advise at Mobile [phone] (mobile)

## 2022-12-11 NOTE — Telephone Encounter (Signed)
Prescription sent electronically to pharmacy. Patient notified. 

## 2022-12-11 NOTE — Telephone Encounter (Signed)
Please note her message for pain med did not say [facility] I sent oxy to [facility] pharmacy on [address] ( this is where the PDMP says she got her last several rxs)  Please call pt inform her of this send doxycycline 100 mg one bid, #20 to which ever [facility] she desires

## 2022-12-12 ENCOUNTER — Other Ambulatory Visit: Payer: Self-pay

## 2022-12-12 ENCOUNTER — Other Ambulatory Visit (HOSPITAL_COMMUNITY): Payer: Self-pay

## 2022-12-12 ENCOUNTER — Ambulatory Visit
Admission: EM | Admit: 2022-12-12 | Discharge: 2022-12-12 | Disposition: A | Discharge status: Other Acute Inpatient Hospital | Payer: HMO | Attending: Nurse Practitioner | Admitting: Nurse Practitioner

## 2022-12-12 ENCOUNTER — Encounter: Payer: Self-pay | Admitting: Emergency Medicine

## 2022-12-12 ENCOUNTER — Encounter (HOSPITAL_COMMUNITY): Payer: Self-pay | Admitting: Emergency Medicine

## 2022-12-12 ENCOUNTER — Emergency Department (HOSPITAL_COMMUNITY): Payer: HMO

## 2022-12-12 ENCOUNTER — Inpatient Hospital Stay (HOSPITAL_COMMUNITY)
Admission: EM | Admit: 2022-12-12 | Discharge: 2022-12-15 | DRG: 196 | Disposition: A | Discharge status: Home / Self Care | Payer: HMO | Attending: Family Medicine | Admitting: Family Medicine

## 2022-12-12 DIAGNOSIS — Z9981 Dependence on supplemental oxygen: Secondary | ICD-10-CM | POA: Diagnosis not present

## 2022-12-12 DIAGNOSIS — E669 Obesity, unspecified: Secondary | ICD-10-CM | POA: Diagnosis present

## 2022-12-12 DIAGNOSIS — F419 Anxiety disorder, unspecified: Secondary | ICD-10-CM

## 2022-12-12 DIAGNOSIS — E739 Lactose intolerance, unspecified: Secondary | ICD-10-CM | POA: Diagnosis present

## 2022-12-12 DIAGNOSIS — F32A Depression, unspecified: Secondary | ICD-10-CM

## 2022-12-12 DIAGNOSIS — E039 Hypothyroidism, unspecified: Secondary | ICD-10-CM | POA: Diagnosis present

## 2022-12-12 DIAGNOSIS — I341 Nonrheumatic mitral (valve) prolapse: Secondary | ICD-10-CM | POA: Diagnosis present

## 2022-12-12 DIAGNOSIS — J849 Interstitial pulmonary disease, unspecified: Secondary | ICD-10-CM | POA: Diagnosis not present

## 2022-12-12 DIAGNOSIS — Y92009 Unspecified place in unspecified non-institutional (private) residence as the place of occurrence of the external cause: Secondary | ICD-10-CM | POA: Diagnosis not present

## 2022-12-12 DIAGNOSIS — G4733 Obstructive sleep apnea (adult) (pediatric): Secondary | ICD-10-CM | POA: Diagnosis present

## 2022-12-12 DIAGNOSIS — M797 Fibromyalgia: Secondary | ICD-10-CM | POA: Diagnosis present

## 2022-12-12 DIAGNOSIS — R062 Wheezing: Secondary | ICD-10-CM

## 2022-12-12 DIAGNOSIS — Z7952 Long term (current) use of systemic steroids: Secondary | ICD-10-CM

## 2022-12-12 DIAGNOSIS — Z882 Allergy status to sulfonamides status: Secondary | ICD-10-CM

## 2022-12-12 DIAGNOSIS — Z1152 Encounter for screening for COVID-19: Secondary | ICD-10-CM | POA: Diagnosis not present

## 2022-12-12 DIAGNOSIS — E271 Primary adrenocortical insufficiency: Secondary | ICD-10-CM | POA: Diagnosis present

## 2022-12-12 DIAGNOSIS — F418 Other specified anxiety disorders: Secondary | ICD-10-CM | POA: Diagnosis present

## 2022-12-12 DIAGNOSIS — Z9049 Acquired absence of other specified parts of digestive tract: Secondary | ICD-10-CM

## 2022-12-12 DIAGNOSIS — J441 Chronic obstructive pulmonary disease with (acute) exacerbation: Secondary | ICD-10-CM | POA: Diagnosis present

## 2022-12-12 DIAGNOSIS — J9811 Atelectasis: Secondary | ICD-10-CM | POA: Diagnosis present

## 2022-12-12 DIAGNOSIS — I5032 Chronic diastolic (congestive) heart failure: Secondary | ICD-10-CM | POA: Diagnosis present

## 2022-12-12 DIAGNOSIS — J9611 Chronic respiratory failure with hypoxia: Secondary | ICD-10-CM | POA: Diagnosis not present

## 2022-12-12 DIAGNOSIS — Z8042 Family history of malignant neoplasm of prostate: Secondary | ICD-10-CM

## 2022-12-12 DIAGNOSIS — M87052 Idiopathic aseptic necrosis of left femur: Secondary | ICD-10-CM

## 2022-12-12 DIAGNOSIS — T380X5A Adverse effect of glucocorticoids and synthetic analogues, initial encounter: Secondary | ICD-10-CM | POA: Diagnosis present

## 2022-12-12 DIAGNOSIS — Z885 Allergy status to narcotic agent status: Secondary | ICD-10-CM

## 2022-12-12 DIAGNOSIS — M87152 Osteonecrosis due to drugs, left femur: Secondary | ICD-10-CM | POA: Diagnosis present

## 2022-12-12 DIAGNOSIS — I272 Pulmonary hypertension, unspecified: Secondary | ICD-10-CM | POA: Diagnosis present

## 2022-12-12 DIAGNOSIS — Z6839 Body mass index (BMI) 39.0-39.9, adult: Secondary | ICD-10-CM

## 2022-12-12 DIAGNOSIS — G894 Chronic pain syndrome: Secondary | ICD-10-CM

## 2022-12-12 DIAGNOSIS — D860 Sarcoidosis of lung: Secondary | ICD-10-CM | POA: Diagnosis present

## 2022-12-12 DIAGNOSIS — R0602 Shortness of breath: Secondary | ICD-10-CM | POA: Diagnosis not present

## 2022-12-12 DIAGNOSIS — J9621 Acute and chronic respiratory failure with hypoxia: Secondary | ICD-10-CM | POA: Diagnosis present

## 2022-12-12 DIAGNOSIS — D869 Sarcoidosis, unspecified: Secondary | ICD-10-CM | POA: Diagnosis not present

## 2022-12-12 DIAGNOSIS — R300 Dysuria: Secondary | ICD-10-CM | POA: Diagnosis present

## 2022-12-12 DIAGNOSIS — K219 Gastro-esophageal reflux disease without esophagitis: Secondary | ICD-10-CM | POA: Diagnosis present

## 2022-12-12 DIAGNOSIS — J8410 Pulmonary fibrosis, unspecified: Principal | ICD-10-CM | POA: Diagnosis present

## 2022-12-12 DIAGNOSIS — R079 Chest pain, unspecified: Secondary | ICD-10-CM | POA: Diagnosis not present

## 2022-12-12 DIAGNOSIS — I10 Essential (primary) hypertension: Secondary | ICD-10-CM | POA: Diagnosis not present

## 2022-12-12 DIAGNOSIS — E785 Hyperlipidemia, unspecified: Secondary | ICD-10-CM | POA: Diagnosis present

## 2022-12-12 DIAGNOSIS — G629 Polyneuropathy, unspecified: Secondary | ICD-10-CM | POA: Diagnosis present

## 2022-12-12 DIAGNOSIS — Z8249 Family history of ischemic heart disease and other diseases of the circulatory system: Secondary | ICD-10-CM | POA: Diagnosis not present

## 2022-12-12 DIAGNOSIS — J439 Emphysema, unspecified: Secondary | ICD-10-CM | POA: Diagnosis not present

## 2022-12-12 DIAGNOSIS — Z79899 Other long term (current) drug therapy: Secondary | ICD-10-CM

## 2022-12-12 DIAGNOSIS — Z803 Family history of malignant neoplasm of breast: Secondary | ICD-10-CM

## 2022-12-12 DIAGNOSIS — Z91041 Radiographic dye allergy status: Secondary | ICD-10-CM

## 2022-12-12 DIAGNOSIS — Z7951 Long term (current) use of inhaled steroids: Secondary | ICD-10-CM

## 2022-12-12 DIAGNOSIS — Z91030 Bee allergy status: Secondary | ICD-10-CM

## 2022-12-12 LAB — CBC WITH DIFFERENTIAL/PLATELET
Abs Immature Granulocytes: 0.21 10*3/uL — ABNORMAL HIGH (ref 0.00–0.07)
Basophils Absolute: 0.1 10*3/uL (ref 0.0–0.1)
Basophils Relative: 1 %
Eosinophils Absolute: 0.1 10*3/uL (ref 0.0–0.5)
Eosinophils Relative: 1 %
HCT: 35.6 % — ABNORMAL LOW (ref 36.0–46.0)
Hemoglobin: 11.1 g/dL — ABNORMAL LOW (ref 12.0–15.0)
Immature Granulocytes: 2 %
Lymphocytes Relative: 9 %
Lymphs Abs: 1 10*3/uL (ref 0.7–4.0)
MCH: 28.8 pg (ref 26.0–34.0)
MCHC: 31.2 g/dL (ref 30.0–36.0)
MCV: 92.2 fL (ref 80.0–100.0)
Monocytes Absolute: 0.3 10*3/uL (ref 0.1–1.0)
Monocytes Relative: 3 %
Neutro Abs: 9 10*3/uL — ABNORMAL HIGH (ref 1.7–7.7)
Neutrophils Relative %: 84 %
Platelets: 359 10*3/uL (ref 150–400)
RBC: 3.86 MIL/uL — ABNORMAL LOW (ref 3.87–5.11)
RDW: 14.7 % (ref 11.5–15.5)
WBC: 10.6 10*3/uL — ABNORMAL HIGH (ref 4.0–10.5)
nRBC: 0 % (ref 0.0–0.2)

## 2022-12-12 LAB — BASIC METABOLIC PANEL
Anion gap: 9 (ref 5–15)
BUN: 9 mg/dL (ref 6–20)
CO2: 31 mmol/L (ref 22–32)
Calcium: 9.2 mg/dL (ref 8.9–10.3)
Chloride: 98 mmol/L (ref 98–111)
Creatinine, Ser: 0.71 mg/dL (ref 0.44–1.00)
GFR, Estimated: >60 mL/min (ref >60)
Glucose, Bld: 117 mg/dL — ABNORMAL HIGH (ref 70–99)
Potassium: 4.2 mmol/L (ref 3.5–5.1)
Sodium: 138 mmol/L (ref 135–145)

## 2022-12-12 LAB — BRAIN NATRIURETIC PEPTIDE: B Natriuretic Peptide: 38 pg/mL (ref 0.0–100.0)

## 2022-12-12 LAB — RESP PANEL BY RT-PCR (RSV, FLU A&B, COVID)  RVPGX2
Influenza A by PCR: NEGATIVE
Influenza B by PCR: NEGATIVE
Resp Syncytial Virus by PCR: NEGATIVE
SARS Coronavirus 2 by RT PCR: NEGATIVE

## 2022-12-12 LAB — MAGNESIUM: Magnesium: 2.3 mg/dL (ref 1.7–2.4)

## 2022-12-12 MED ORDER — OXYCODONE HCL 5 MG PO TABS
10.0000 mg | ORAL_TABLET | ORAL | Status: DC | PRN
  Administered 2022-12-12 – 2022-12-15 (×10): 10 mg via ORAL
  Filled 2022-12-12 (×10): qty 2

## 2022-12-12 MED ORDER — BUDESONIDE 0.5 MG/2ML IN SUSP
0.5000 mg | Freq: Two times a day (BID) | RESPIRATORY_TRACT | Status: DC
  Administered 2022-12-12 – 2022-12-15 (×6): 0.5 mg via RESPIRATORY_TRACT
  Filled 2022-12-12 (×5): qty 2

## 2022-12-12 MED ORDER — ACETAMINOPHEN 650 MG RE SUPP: 650.0000 mg | Freq: Four times a day (QID) | RECTAL | Status: DC | PRN

## 2022-12-12 MED ORDER — AZITHROMYCIN 250 MG PO TABS
500.0000 mg | ORAL_TABLET | Freq: Once | ORAL | Status: DC
  Filled 2022-12-12: qty 2

## 2022-12-12 MED ORDER — ACETAMINOPHEN 325 MG PO TABS
650.0000 mg | ORAL_TABLET | Freq: Four times a day (QID) | ORAL | Status: DC | PRN
  Administered 2022-12-14: 650 mg via ORAL
  Filled 2022-12-12: qty 2

## 2022-12-12 MED ORDER — ENOXAPARIN SODIUM 40 MG/0.4ML IJ SOSY
40.0000 mg | PREFILLED_SYRINGE | Freq: Every day | INTRAMUSCULAR | Status: DC
  Administered 2022-12-12: 40 mg via SUBCUTANEOUS
  Filled 2022-12-12: qty 0.4

## 2022-12-12 MED ORDER — IPRATROPIUM BROMIDE 0.02 % IN SOLN: 0.5000 mg | Freq: Once | RESPIRATORY_TRACT | Status: DC

## 2022-12-12 MED ORDER — ESCITALOPRAM OXALATE 10 MG PO TABS
10.0000 mg | ORAL_TABLET | Freq: Every day | ORAL | Status: DC
  Administered 2022-12-13 – 2022-12-15 (×3): 10 mg via ORAL
  Filled 2022-12-12 (×3): qty 1

## 2022-12-12 MED ORDER — ROSUVASTATIN CALCIUM 10 MG PO TABS
10.0000 mg | ORAL_TABLET | Freq: Every day | ORAL | Status: DC
  Administered 2022-12-13 – 2022-12-15 (×3): 10 mg via ORAL
  Filled 2022-12-12 (×3): qty 1

## 2022-12-12 MED ORDER — HYDROXYZINE HCL 25 MG PO TABS
25.0000 mg | ORAL_TABLET | Freq: Three times a day (TID) | ORAL | Status: DC | PRN
  Administered 2022-12-13 – 2022-12-15 (×5): 25 mg via ORAL
  Filled 2022-12-12 (×6): qty 1

## 2022-12-12 MED ORDER — GUAIFENESIN-DM 100-10 MG/5ML PO SYRP: 15.0000 mL | ORAL_SOLUTION | Freq: Four times a day (QID) | ORAL | Status: DC | PRN

## 2022-12-12 MED ORDER — ALPRAZOLAM 0.5 MG PO TABS
0.5000 mg | ORAL_TABLET | Freq: Once | ORAL | Status: AC
  Administered 2022-12-12: 0.5 mg via ORAL
  Filled 2022-12-12: qty 1

## 2022-12-12 MED ORDER — ALBUTEROL (5 MG/ML) CONTINUOUS INHALATION SOLN
10.0000 mg/h | INHALATION_SOLUTION | Freq: Once | RESPIRATORY_TRACT | Status: DC
  Filled 2022-12-12: qty 20

## 2022-12-12 MED ORDER — SODIUM CHLORIDE 0.9 % IV SOLN
100.0000 mg | Freq: Two times a day (BID) | INTRAVENOUS | Status: DC
  Administered 2022-12-12 – 2022-12-14 (×4): 100 mg via INTRAVENOUS
  Filled 2022-12-12 (×8): qty 100

## 2022-12-12 MED ORDER — LEVOTHYROXINE SODIUM 50 MCG PO TABS
50.0000 ug | ORAL_TABLET | Freq: Every day | ORAL | Status: DC
  Administered 2022-12-13 – 2022-12-15 (×3): 50 ug via ORAL
  Filled 2022-12-12 (×3): qty 1

## 2022-12-12 MED ORDER — POLYETHYLENE GLYCOL 3350 17 G PO PACK: 17.0000 g | PACK | Freq: Every day | ORAL | Status: DC | PRN

## 2022-12-12 MED ORDER — ALBUTEROL SULFATE (2.5 MG/3ML) 0.083% IN NEBU
5.0000 mg | INHALATION_SOLUTION | RESPIRATORY_TRACT | Status: AC
  Administered 2022-12-12 (×3): 5 mg via RESPIRATORY_TRACT
  Filled 2022-12-12: qty 6

## 2022-12-12 MED ORDER — METHYLPREDNISOLONE SODIUM SUCC 125 MG IJ SOLR
60.0000 mg | Freq: Two times a day (BID) | INTRAMUSCULAR | Status: DC
  Administered 2022-12-13 – 2022-12-15 (×5): 60 mg via INTRAVENOUS
  Filled 2022-12-12 (×5): qty 2

## 2022-12-12 MED ORDER — IPRATROPIUM-ALBUTEROL 0.5-2.5 (3) MG/3ML IN SOLN
3.0000 mL | Freq: Once | RESPIRATORY_TRACT | Status: AC
  Administered 2022-12-12: 3 mL via RESPIRATORY_TRACT

## 2022-12-12 MED ORDER — IPRATROPIUM BROMIDE 0.02 % IN SOLN
0.5000 mg | Freq: Once | RESPIRATORY_TRACT | Status: AC
  Administered 2022-12-12: 0.5 mg via RESPIRATORY_TRACT
  Filled 2022-12-12: qty 2.5

## 2022-12-12 MED ORDER — IPRATROPIUM-ALBUTEROL 0.5-2.5 (3) MG/3ML IN SOLN
3.0000 mL | Freq: Three times a day (TID) | RESPIRATORY_TRACT | Status: AC
  Administered 2022-12-13 (×2): 3 mL via RESPIRATORY_TRACT
  Filled 2022-12-12 (×3): qty 3

## 2022-12-12 NOTE — ED Provider Notes (Addendum)
[place] [facility] DEPARTMENT AT [facility] Provider Note   CSN: [id number] Arrival date & time: 12/12/22  1557     History  Chief Complaint  Patient presents with   Shortness of Breath    Kathleen [name] is a 62 y.o. female.  HPI    62 year old patient comes in with chief complaint of shortness of breath. Patient has history of pulmonary sarcoidosis, interstitial lung disease, pulmonary hypertension and diastolic CHF.  Patient states that she has been feeling unwell for the last 3 days or so.  She is having some URI-like symptoms along with wheezing, nonproductive cough.  Today she started noticing some chest discomfort with worsening shortness of breath, and she decided to go to urgent care.  At the urgent care she was noted to be hypoxic on room air and sent to the emergency room.  Patient normally uses oxygen at nighttime and then as needed.  She has noticed worsening shortness of breath with this illness.  She has been taking her scheduled nebulizers only.  Yesterday she took 1 rescue inhaler with transient relief.  And route to the emergency room EMS gave her Solu-Medrol and breathing treatment and she feels a little better.  She has taken home COVID test and it has been negative.  Home Medications Prior to Admission medications   Medication Sig Start Date End Date Taking? Authorizing Provider  acetaminophen (TYLENOL) 500 MG tablet Take 1,000 mg by mouth every 8 (eight) hours as needed for headache.   Yes [provider]  albuterol (VENTOLIN HFA) 108 (90 Base) MCG/ACT inhaler Inhale 2 puffs into the lungs every 6 (six) hours as needed for wheezing or shortness of breath. 05/22/21  Yes Emokpae, Courage, MD  ascorbic acid (VITAMIN C) 500 MG tablet Take 1 tablet (500 mg total) by mouth [name]. 05/23/21  Yes Emokpae, Courage, MD  budesonide (PULMICORT) 0.5 MG/2ML nebulizer solution Inhale 2 mL (0.5 mg total) by nebulization two (2) times a day as needed.  06/08/21  Yes   doxycycline (VIBRA-TABS) 100 MG tablet Take 1 tablet (100 mg total) by mouth 2 (two) times [name]. 12/11/22  Yes Luking, Lorin Picket A, MD  EPINEPHrine 0.3 mg/0.3 mL IJ SOAJ injection Inject 0.3 mg into the muscle as needed for anaphylaxis. 01/05/22  Yes Luking, Jonna Coup, MD  escitalopram (LEXAPRO) 10 MG tablet Take 1 tablet (10 mg total) by mouth [name]. 07/30/22  Yes Luking, Jonna Coup, MD  hydrocortisone (CORTEF) 10 MG tablet Take 2 tablets by mouth at 8:00 am and 1 tablet at lunch. (May double dose on sick days x 3-5 days) 10/24/22  Yes Nida, Denman George, MD  hydrOXYzine (ATARAX) 25 MG tablet Take 1 tablet (25 mg total) by mouth 3 (three) times [name] as needed for anxiety 11/27/22 11/27/23 Yes Luking, Jonna Coup, MD  levothyroxine (SYNTHROID) 50 MCG tablet Take 1 tablet by mouth [name] before breakfast. 10/24/22 04/22/23 Yes Nida, Denman George, MD  ondansetron (ZOFRAN-ODT) 4 MG disintegrating tablet Take 1 tablet (4 mg total) by mouth every 8 (eight) hours as needed for nausea or vomiting. 03/02/22  Yes Dykstra, Quitman Livings, MD  Oxycodone HCl 10 MG TABS Take 1 tablet (10 mg total) by mouth every 4 (four) hours as needed for pain. Max of 6 tablets [name]. 09/10/22  Yes [name], MD  pantoprazole (PROTONIX) 40 MG tablet Take 1 tablet by mouth 2 times [name] before a meal. 11/08/22 11/08/23 Yes Luking, Jonna Coup, MD  predniSONE (DELTASONE) 5 MG tablet  Take 1 tablet (5 mg total) by mouth in the morning. Patient taking differently: Take 10 mg by mouth in the morning. 07/04/22  Yes   rosuvastatin (CRESTOR) 10 MG tablet Take 1 tablet (10 mg total) by mouth [name]. 07/03/22  Yes Luking, Scott A, MD  budesonide (PULMICORT) 0.5 MG/2ML nebulizer solution Inhale the contents of 1 vial via nebulizer two (2) times a day. 11/22/21     ferrous sulfate 325 (65 FE) MG EC tablet Take 325 mg by mouth as needed. Patient not taking: Reported on 12/12/2022    [provider]  formoterol (PERFOROMIST) 20 MCG/2ML nebulizer  solution Inhale the contents of 1 vial via nebulizer two (2) times a day. 11/22/21     linaclotide (LINZESS) 290 MCG CAPS capsule Take 1 capsule (290 mcg total) by mouth [name] before breakfast. Patient not taking: Reported on 12/12/2022 03/28/22   [name], MD  mupirocin ointment (BACTROBAN) 2 % Apply in right nostril twice [name] Patient not taking: Reported on 12/12/2022 08/22/22   [name], MD  ondansetron (ZOFRAN-ODT) 8 MG disintegrating tablet Dissolve 1 tablet (8 mg total) by mouth every 8 (eight) hours as needed. Patient not taking: Reported on 12/12/2022 07/04/22 07/04/23  [name], MD  Oxycodone HCl 10 MG TABS Take 1 tablet (10 mg total) by mouth every 4 (four) hours as needed for pain (max 6 tablets [name]).Fill 08/31/22 Patient not taking: Reported on 12/12/2022 09/10/22   [name], MD  Oxycodone HCl 10 MG TABS Take 1 tablet (10 mg total) by mouth every 4 (four) hours as needed for pain (max 6 tabs [name]) Patient not taking: Reported on 12/12/2022 12/11/22   [name], MD  predniSONE (DELTASONE) 20 MG tablet 2 qd for 5d Patient not taking: Reported on 12/12/2022 09/26/22   [name], MD  prochlorperazine (COMPAZINE) 10 MG tablet Take 1 tablet (10 mg total) by mouth every 8 (eight) hours as needed for nausea or vomiting. Patient not taking: Reported on 12/12/2022 10/23/22   Pollyann Savoy, MD  sucralfate (CARAFATE) 1 GM/10ML suspension Take 10 mLs (1 g total) by mouth 4 (four) times [name]. Patient not taking: Reported on 12/12/2022 01/16/22   Malissa Hippo, MD      Allergies    Bee venom, Contrast media [iodinated contrast media], Lactose intolerance (gi), Bactrim [sulfamethoxazole-trimethoprim], Codeine, and Morphine and related    Review of Systems   Review of Systems  All other systems reviewed and are negative.   Physical Exam Updated Vital Signs BP (!) 156/77   Pulse 93   Temp 97.6 F (36.4 C) (Oral)   Resp (!) 21   Ht  (1.727 m)   Wt  115 kg   LMP 05/17/2016   SpO2 99%   BMI 38.55 kg/m  Physical Exam Vitals and nursing note reviewed.  Constitutional:      Appearance: She is well-developed.  HENT:     Head: Normocephalic and atraumatic.  Eyes:     Extraocular Movements: Extraocular movements intact.  Cardiovascular:     Rate and Rhythm: Normal rate.  Pulmonary:     Effort: Pulmonary effort is normal.     Breath sounds: Examination of the right-upper field reveals wheezing. Examination of the left-upper field reveals wheezing. Examination of the right-middle field reveals wheezing. Examination of the left-middle field reveals wheezing. Examination of the right-lower field reveals wheezing. Examination of the left-lower field reveals wheezing. Wheezing present. No decreased breath sounds or  rhonchi.  Musculoskeletal:     Cervical back: Normal range of motion and neck supple.  Skin:    General: Skin is dry.  Neurological:     Mental Status: She is alert and oriented to person, place, and time.     ED Results / Procedures / Treatments   Labs (all labs ordered are listed, but only abnormal results are displayed) Labs Reviewed  BASIC METABOLIC PANEL - Abnormal; Notable for the following components:      Result Value   Glucose, Bld 117 (*)    All other components within normal limits  CBC WITH DIFFERENTIAL/PLATELET - Abnormal; Notable for the following components:   WBC 10.6 (*)    RBC 3.86 (*)    Hemoglobin 11.1 (*)    HCT 35.6 (*)    Neutro Abs 9.0 (*)    Abs Immature Granulocytes 0.21 (*)    All other components within normal limits  RESP PANEL BY RT-PCR (RSV, FLU A&B, COVID)  [id number]  BRAIN NATRIURETIC PEPTIDE    EKG EKG Interpretation  Date/Time:  Wednesday December 12 2022 16:05:24 EDT Ventricular Rate:  79 PR Interval:  129 QRS Duration: 65 QT Interval:  540 QTC Calculation: 620 R Axis:   68 Text Interpretation: Sinus rhythm Abnormal R-wave progression, early transition Nonspecific T  abnormalities, lateral leads Prolonged QT interval repeat ordered for prologed QT concerns as it seems that she doesnt have QTc > 600 Confirmed by Derwood Kaplan ([id number]) on 12/12/2022 8:05:09 PM  Radiology DG Chest Port 1 View  Result Date: 12/12/2022 CLINICAL DATA:  Shortness of breath. EXAM: PORTABLE CHEST 1 VIEW COMPARISON:  Chest radiograph dated 10/22/2022. FINDINGS: Background of emphysema and left upper lobe scarring. There are bibasilar densities which may represent atelectasis. Pneumonia is less likely but not excluded. No large pleural effusion. No pneumothorax. Stable cardiomediastinal silhouette. No acute osseous pathology. IMPRESSION: Bibasilar atelectasis versus less likely infiltrate. Electronically Signed   By: Elgie Collard M.D.   On: 12/12/2022 18:05    Procedures .Critical Care  Performed by: Derwood Kaplan, MD Authorized by: Derwood Kaplan, MD   Critical care provider statement:    Critical care time (minutes):  39   Critical care was necessary to treat or prevent imminent or life-threatening deterioration of the following conditions:  Respiratory failure   Critical care was time spent personally by me on the following activities:  Development of treatment plan with patient or surrogate, discussions with consultants, evaluation of patient's response to treatment, examination of patient, ordering and review of laboratory studies, ordering and review of radiographic studies, ordering and performing treatments and interventions, pulse oximetry, re-evaluation of patient's condition and review of old charts     Medications Ordered in ED Medications  doxycycline (VIBRAMYCIN) 100 mg in sodium chloride 0.9 % 250 mL IVPB (has no administration in time range)  albuterol (PROVENTIL) (2.5 MG/3ML) 0.083% nebulizer solution 5 mg (5 mg Nebulization Given 12/12/22 1834)  ipratropium (ATROVENT) nebulizer solution 0.5 mg (0.5 mg Nebulization Given 12/12/22 1834)    ED Course/ Medical  Decision Making/ A&P                             Medical Decision Making Amount and/or Complexity of Data Reviewed Labs: ordered. Radiology: ordered.  Risk Prescription drug management. Decision regarding hospitalization.   This patient presents to the ED with chief complaint(s) of worsening cough, shortness of breath and weakness with pertinent past medical history of  interstitial lung disease, pulmonary hypertension, congestive heart failure and sarcoidosis.The complaint involves an extensive differential diagnosis and also carries with it a high risk of complications and morbidity.    The differential diagnosis includes : Sarcoidosis flare, pneumonia, COPD exacerbation, viral illness, pulmonary hypertension exacerbation, CHF exacerbation.  The initial plan is to get basic labs, initiate breathing treatments and reassess the patient.  She is having diffuse wheezing, there is no rhonchi and patient denies any fevers.   Additional history obtained: Records reviewed  urgent care notes, urgent care vital signs reviewed.  Independent labs interpretation:  The following labs were independently interpreted: CBC shows white count of 10.6.  COVID-19 test is negative.  Independent visualization and interpretation of imaging: - I independently visualized the following imaging with scope of interpretation limited to determining acute life threatening conditions related to emergency care: X-ray of the chest, which revealed no evidence of pulmonary edema and no focal consolidations.  Treatment and Reassessment: Patient reassessed after she received another round of breathing treatment in the emergency room.  She is still wheezing.  She does not feel comfortable going home.  No profound leukocytosis. My suspicion is still lower for bacterial pneumonia, therefore we will not initiate antibiotics for it.  We will start her antibiotics for COPD exacerbation.  We will also add BNP and a repeat EKG,  the latter because initial EKG shows prolonged QTc.   Final Clinical Impression(s) / ED Diagnoses Final diagnoses:  Pulmonary fibrosis  Acute exacerbation of chronic obstructive pulmonary disease (COPD)    Rx / DC Orders ED Discharge Orders     None         Derwood Kaplan, MD 12/12/22 2012    Derwood Kaplan, MD 12/12/22 2237

## 2022-12-12 NOTE — ED Notes (Signed)
Pt is asking for their "pain pill" states they have not taken their pain medication since 0800. Provider made aware.

## 2022-12-12 NOTE — ED Triage Notes (Signed)
Pt sent over by urgent care for sob. Pt is on continuous o2 at home 3lpm.

## 2022-12-12 NOTE — Assessment & Plan Note (Signed)
With fibromyalgia.  Patient anxious, jittery on exam. -Xanax 0.5 x 1 -Resume escitalopram, hydroxyzine,

## 2022-12-12 NOTE — Discharge Instructions (Addendum)
Please go to the ED for further evaluation and management of your breathing and chest pain

## 2022-12-12 NOTE — Assessment & Plan Note (Signed)
Reports chronic left hip pain. -Resume home oxycodone every 4 hourly as needed

## 2022-12-12 NOTE — ED Notes (Signed)
ED TO INPATIENT HANDOFF REPORT  ED Nurse Name and Phone #: Efraim Kaufmann, RN  S Name/Age/Gender [name] 62 y.o. female Room/Bed: APA06/APA06  Code Status   Code Status: Prior  Home/SNF/Other Home Patient oriented to: self, place, time, and situation Is this baseline? Yes   Triage Complete: Triage complete  Chief Complaint COPD with acute exacerbation [J44.1]  Triage Note Pt sent over by urgent care for sob. Pt is on continuous o2 at home 3lpm.  Ems gave pt 125mg  of solumedrol, 5mg  albuterol and 0.5mg  of atrovent.    Allergies Allergies  Allergen Reactions   Bee Venom Anaphylaxis   Contrast Media [Iodinated Contrast Media]     CT chest w/ contrast at OSH followed by SOB resolved w/ single dose steroids, never ENT swelling, intubation or pressors, has had multiple contrasted scans since   Lactose Intolerance (Gi)     GI Upset   Bactrim [Sulfamethoxazole-Trimethoprim] Nausea And Vomiting   Codeine Itching   Morphine And Related Other (See Comments)    Unknown reaction- pt gets sick, dizzy, and confused Intolerance, not an allergy, received this in the emergency department recently without a problem.    Level of Care/Admitting Diagnosis ED Disposition     ED Disposition  Admit   Condition  --   Comment  Hospital Area: [facility] [[id number]]  Level of Care: Telemetry [5]  Covid Evaluation: Asymptomatic - no recent exposure (last 10 days) testing not required  Diagnosis: COPD with acute exacerbation [308657]  Admitting Physician: Onnie Boer [[id number]]  Attending Physician: Cresenciano Lick          B Medical/Surgery History Past Medical History:  Diagnosis Date   Adrenal insufficiency    Anemia    Anxiety    Avascular bone necrosis    Breast fibrocystic disorder    Chronic pain    COPD (chronic obstructive pulmonary disease)    Encounter for long-term opiate analgesic use 02/02/2020   Patient has chronic pain with femoral  head avascular necrosis.  Is under pain contract   Fibromyalgia    Gastroesophageal reflux disease    Hypothyroidism    Mitral valve prolapse    Orthostatic hypotension    Peripheral neuropathy    PONV (postoperative nausea and vomiting)    Restrictive lung disease    Moderate to severe   Sarcoidosis    Biopsy proven - [facility]   SOB (shortness of breath)    chronic   Past Surgical History:  Procedure Laterality Date   BIOPSY  10/17/2018   Procedure: BIOPSY;  Surgeon: Malissa Hippo, MD;  Location: [facility];  Service: Endoscopy;;  duodenum, antrum, gastric body   BIOPSY  03/03/2021   Procedure: BIOPSY;  Surgeon: Malissa Hippo, MD;  Location: [facility];  Service: Endoscopy;;   BREAST LUMPECTOMY Bilateral 01/12/2011   CHOLECYSTECTOMY N/A 04/17/2021   Procedure: LAPAROSCOPIC CHOLECYSTECTOMY;  Surgeon: Franky Macho, MD;  Location: [facility];  Service: General;  Laterality: N/A;   COLONOSCOPY WITH PROPOFOL N/A 01/03/2022   Procedure: COLONOSCOPY WITH PROPOFOL;  Surgeon: Malissa Hippo, MD;  Location: [facility];  Service: Endoscopy;  Laterality: N/A;  220   ESOPHAGEAL DILATION N/A 03/03/2021   Procedure: ESOPHAGEAL DILATION;  Surgeon: Malissa Hippo, MD;  [facility]: [facility];  Service: Endoscopy;  Laterality: N/A;   ESOPHAGOGASTRODUODENOSCOPY (EGD) WITH PROPOFOL N/A 10/17/2018   Procedure: ESOPHAGOGASTRODUODENOSCOPY (EGD) WITH PROPOFOL;  Surgeon: Malissa Hippo, MD;  [facility];  Service: Endoscopy;  Laterality: N/A;  2:25   ESOPHAGOGASTRODUODENOSCOPY (EGD) WITH PROPOFOL N/A 03/03/2021   Procedure: ESOPHAGOGASTRODUODENOSCOPY (EGD) WITH PROPOFOL;  Surgeon: Malissa Hippo, MD;  [facility]: [facility];  Service: Endoscopy;  Laterality: N/A;  12:15   POLYPECTOMY  01/03/2022   Procedure: POLYPECTOMY INTESTINAL;  Surgeon: Malissa Hippo, MD;  Location: [facility];  Service: Endoscopy;;   TUBAL LIGATION       A IV Location/Drains/Wounds Patient  Lines/Drains/Airways Status     Active Line/Drains/Airways     Name Placement date Placement time Site Days   Peripheral IV 12/12/22 20 G Right Wrist 12/12/22  1540  Wrist  less than 1            Intake/Output Last 24 hours No intake or output data in the 24 hours ending 12/12/22 2023  Labs/Imaging Results for orders placed or performed during the hospital encounter of 12/12/22 (from the past 48 hour(s))  Basic metabolic panel     Status: Abnormal   Collection Time: 12/12/22  5:53 PM  Result Value Ref Range   Sodium 138 135 - 145 mmol/L   Potassium 4.2 3.5 - 5.1 mmol/L   Chloride 98 98 - 111 mmol/L   CO2 31 22 - 32 mmol/L   Glucose, Bld 117 (H) 70 - 99 mg/dL    Comment: Glucose reference range applies only to samples taken after fasting for at least 8 hours.   BUN 9 6 - 20 mg/dL   Creatinine, Ser 1.61 0.44 - 1.00 mg/dL   Calcium 9.2 8.9 - 09.6 mg/dL   GFR, Estimated >04 >54 mL/min    Comment: (NOTE) Calculated using the CKD-EPI Creatinine Equation (2021)    Anion gap 9 5 - 15    Comment: Performed at [facility], [address], [place], [place] [address]  CBC with Differential/Platelet     Status: Abnormal   Collection Time: 12/12/22  5:53 PM  Result Value Ref Range   WBC 10.6 (H) 4.0 - 10.5 K/uL   RBC 3.86 (L) 3.87 - 5.11 MIL/uL   Hemoglobin 11.1 (L) 12.0 - 15.0 g/dL   HCT 91.4 (L) 78.2 - 95.6 %   MCV 92.2 80.0 - 100.0 fL   MCH 28.8 26.0 - 34.0 pg   MCHC 31.2 30.0 - 36.0 g/dL   RDW 21.3 08.6 - 57.8 %   Platelets 359 150 - 400 K/uL   nRBC 0.0 0.0 - 0.2 %   Neutrophils Relative % 84 %   Neutro Abs 9.0 (H) 1.7 - 7.7 K/uL   Lymphocytes Relative 9 %   Lymphs Abs 1.0 0.7 - 4.0 K/uL   Monocytes Relative 3 %   Monocytes Absolute 0.3 0.1 - 1.0 K/uL   Eosinophils Relative 1 %   Eosinophils Absolute 0.1 0.0 - 0.5 K/uL   Basophils Relative 1 %   Basophils Absolute 0.1 0.0 - 0.1 K/uL   Immature Granulocytes 2 %   Abs Immature Granulocytes 0.21 (H) 0.00 - 0.07 K/uL     Comment: Performed at [facility], [address] [place], [place] [address]  Resp panel by RT-PCR (RSV, Flu A&B, Covid) Anterior Nasal Swab     Status: None   Collection Time: 12/12/22  6:27 PM   Specimen: Anterior Nasal Swab  Result Value Ref Range   SARS Coronavirus 2 by RT PCR NEGATIVE NEGATIVE    Comment: (NOTE) SARS-CoV-2 target nucleic acids are NOT DETECTED.  The SARS-CoV-2 RNA is generally detectable in upper  respiratory specimens during the acute phase of infection. The lowest concentration of SARS-CoV-2 viral copies this assay can detect is 138 copies/mL. A negative result does not preclude SARS-Cov-2 infection and should not be used as the sole basis for treatment or other patient management decisions. A negative result may occur with  improper specimen collection/handling, submission of specimen other than nasopharyngeal swab, presence of viral mutation(s) within the areas targeted by this assay, and inadequate number of viral copies(<138 copies/mL). A negative result must be combined with clinical observations, patient history, and epidemiological information. The expected result is Negative.  Fact Sheet for Patients:  BloggerCourse.com  Fact Sheet for Healthcare Providers:  SeriousBroker.it  This test is no t yet approved or cleared by the [place] FDA and  has been authorized for detection and/or diagnosis of SARS-CoV-2 by FDA under an Emergency Use Authorization (EUA). This EUA will remain  in effect (meaning this test can be used) for the duration of the COVID-19 declaration under Section 564(b)(1) of the Act, 21 U.S.C.section 360bbb-3(b)(1), unless the authorization is terminated  or revoked sooner.       Influenza A by PCR NEGATIVE NEGATIVE   Influenza B by PCR NEGATIVE NEGATIVE    Comment: (NOTE) The Xpert Xpress SARS-CoV-2/FLU/RSV plus assay is intended as an aid in the diagnosis of influenza from  Nasopharyngeal swab specimens and should not be used as a sole basis for treatment. Nasal washings and aspirates are unacceptable for Xpert Xpress SARS-CoV-2/FLU/RSV testing.  Fact Sheet for Patients: BloggerCourse.com  Fact Sheet for Healthcare Providers: SeriousBroker.it  This test is not yet approved or cleared by the [place] FDA and has been authorized for detection and/or diagnosis of SARS-CoV-2 by FDA under an Emergency Use Authorization (EUA). This EUA will remain in effect (meaning this test can be used) for the duration of the COVID-19 declaration under Section 564(b)(1) of the Act, 21 U.S.C. section 360bbb-3(b)(1), unless the authorization is terminated or revoked.     Resp Syncytial Virus by PCR NEGATIVE NEGATIVE    Comment: (NOTE) Fact Sheet for Patients: BloggerCourse.com  Fact Sheet for Healthcare Providers: SeriousBroker.it  This test is not yet approved or cleared by the [place] FDA and has been authorized for detection and/or diagnosis of SARS-CoV-2 by FDA under an Emergency Use Authorization (EUA). This EUA will remain in effect (meaning this test can be used) for the duration of the COVID-19 declaration under Section 564(b)(1) of the Act, 21 U.S.C. section 360bbb-3(b)(1), unless the authorization is terminated or revoked.  Performed at [facility], [address], [place], [place] [address]    *Note: Due to a large number of results and/or encounters for the requested time period, some results have not been displayed. A complete set of results can be found in Results Review.   DG Chest Port 1 View  Result Date: 12/12/2022 CLINICAL DATA:  Shortness of breath. EXAM: PORTABLE CHEST 1 VIEW COMPARISON:  Chest radiograph dated 10/22/2022. FINDINGS: Background of emphysema and left upper lobe scarring. There are bibasilar densities which may represent  atelectasis. Pneumonia is less likely but not excluded. No large pleural effusion. No pneumothorax. Stable cardiomediastinal silhouette. No acute osseous pathology. IMPRESSION: Bibasilar atelectasis versus less likely infiltrate. Electronically Signed   By: Elgie Collard M.D.   On: 12/12/2022 18:05    Pending Labs Unresulted Labs (From admission, onward)     Start     Ordered   12/12/22 2000  Brain natriuretic peptide  Once,   URGENT  12/12/22 1959            Vitals/Pain Today's Vitals   12/12/22 1900 12/12/22 1915 12/12/22 1930 12/12/22 2019  BP: 139/73  (!) 156/77   Pulse: 77 78 93   Resp: (!) 21 14 (!) 21   Temp:    97.8 F (36.6 C)  TempSrc:    Oral  SpO2: 100% 100% 99%   Weight:      Height:      PainSc:        Isolation Precautions No active isolations  Medications Medications  doxycycline (VIBRAMYCIN) 100 mg in sodium chloride 0.9 % 250 mL IVPB (has no administration in time range)  Oxycodone HCl TABS 10 mg (has no administration in time range)  albuterol (PROVENTIL) (2.5 MG/3ML) 0.083% nebulizer solution 5 mg (5 mg Nebulization Given 12/12/22 1834)  ipratropium (ATROVENT) nebulizer solution 0.5 mg (0.5 mg Nebulization Given 12/12/22 1834)    Mobility walks     Focused Assessments Pulmonary Assessment Handoff:  Lung sounds: Bilateral Breath Sounds: Expiratory wheezes L Breath Sounds: Expiratory wheezes R Breath Sounds: Expiratory wheezes O2 Device: Nasal Cannula O2 Flow Rate (L/min): 3 L/min    R Recommendations: See Admitting Provider Note  Report given to:   Additional Notes: .

## 2022-12-12 NOTE — ED Notes (Signed)
Antibiotic with pt

## 2022-12-12 NOTE — H&P (Signed)
History and Physical    Kathleen [name] NWG:[id number] DOB: May 24, 1961 DOA: 12/12/2022  PCP: [name], MD   Patient coming from: Home  I have personally briefly reviewed patient's old medical records in [facility]  Chief Complaint: Difficulty breathing  HPI: Kathleen [name] is a 62 y.o. female with medical history significant for chronic respiratory failure on 3 L as needed and nightly, sarcoidosis, pulmonary fibrosis, COPD listed in medical history, but patient denies this diagnosis. Presented to the ED with complaints of difficulty breathing over the past 2 days, and worsening cough.  She went to an urgent care today and was referred to the ED due to O2 sats in the 80s on room air, and audible wheezing.  Patient denies chest pain to me.  No lower extremity swelling.  Patient was given 125 mg Solu-Medrol by EMS, patient reports this helped the most.  ED Course: O2 sats 98 to 100% on home 3 L, temperature 97.6.  Heart rate 77-93.  Respiratory rate 13 - 22.  Chest x-ray shows bibasilar atelectasis less likely infiltrate. With persistent wheezing, and increased O2 demands, hospitalist was called for admission.  Review of Systems: As per HPI all other systems reviewed and negative.  Past Medical History:  Diagnosis Date   Adrenal insufficiency    Anemia    Anxiety    Avascular bone necrosis    Breast fibrocystic disorder    Chronic pain    COPD (chronic obstructive pulmonary disease)    Encounter for long-term opiate analgesic use 02/02/2020   Patient has chronic pain with femoral head avascular necrosis.  Is under pain contract   Fibromyalgia    Gastroesophageal reflux disease    Hypothyroidism    Mitral valve prolapse    Orthostatic hypotension    Peripheral neuropathy    PONV (postoperative nausea and vomiting)    Restrictive lung disease    Moderate to severe   Sarcoidosis    Biopsy proven - [facility]   SOB (shortness of breath)    chronic    Past Surgical  History:  Procedure Laterality Date   BIOPSY  10/17/2018   Procedure: BIOPSY;  Surgeon: Malissa Hippo, MD;  [facility]: [facility];  Service: Endoscopy;;  duodenum, antrum, gastric body   BIOPSY  03/03/2021   Procedure: BIOPSY;  Surgeon: Malissa Hippo, MD;  Location: [facility];  Service: Endoscopy;;   BREAST LUMPECTOMY Bilateral 01/12/2011   CHOLECYSTECTOMY N/A 04/17/2021   Procedure: LAPAROSCOPIC CHOLECYSTECTOMY;  Surgeon: Franky Macho, MD;  Location: [facility];  Service: General;  Laterality: N/A;   COLONOSCOPY WITH PROPOFOL N/A 01/03/2022   Procedure: COLONOSCOPY WITH PROPOFOL;  Surgeon: Malissa Hippo, MD;  Location: [facility];  Service: Endoscopy;  Laterality: N/A;  220   ESOPHAGEAL DILATION N/A 03/03/2021   Procedure: ESOPHAGEAL DILATION;  Surgeon: Malissa Hippo, MD;  [facility]: [facility];  Service: Endoscopy;  Laterality: N/A;   ESOPHAGOGASTRODUODENOSCOPY (EGD) WITH PROPOFOL N/A 10/17/2018   Procedure: ESOPHAGOGASTRODUODENOSCOPY (EGD) WITH PROPOFOL;  Surgeon: Malissa Hippo, MD;  [facility];  Service: Endoscopy;  Laterality: N/A;  2:25   ESOPHAGOGASTRODUODENOSCOPY (EGD) WITH PROPOFOL N/A 03/03/2021   Procedure: ESOPHAGOGASTRODUODENOSCOPY (EGD) WITH PROPOFOL;  Surgeon: Malissa Hippo, MD;  [facility];  Service: Endoscopy;  Laterality: N/A;  12:15   POLYPECTOMY  01/03/2022   Procedure: POLYPECTOMY INTESTINAL;  Surgeon: Malissa Hippo, MD;  [facility]: [facility];  Service: Endoscopy;;   TUBAL LIGATION  reports that she has never smoked. She has never been exposed to tobacco smoke. She has never used smokeless tobacco. She reports that she does not drink alcohol and does not use drugs.  Allergies  Allergen Reactions   Bee Venom Anaphylaxis   Contrast Media [Iodinated Contrast Media]     CT chest w/ contrast at OSH followed by SOB resolved w/ single dose steroids, never ENT swelling, intubation or pressors, has had multiple  contrasted scans since   Lactose Intolerance (Gi)     GI Upset   Bactrim [Sulfamethoxazole-Trimethoprim] Nausea And Vomiting   Codeine Itching   Morphine And Related Other (See Comments)    Unknown reaction- pt gets sick, dizzy, and confused Intolerance, not an allergy, received this in the emergency department recently without a problem.    Family History  Problem Relation Age of Onset   Cardiomyopathy Mother    Breast cancer Mother    Prostate cancer Father    Heart attack Maternal Grandmother    Heart attack Maternal Grandfather    Heart attack Paternal Grandmother    Bipolar disorder Daughter    Heart disease Maternal Aunt    Neurofibromatosis Cousin    Colon cancer Neg Hx     Prior to Admission medications   Medication Sig Start Date End Date Taking? Authorizing Provider  acetaminophen (TYLENOL) 500 MG tablet Take 1,000 mg by mouth every 8 (eight) hours as needed for headache.    [provider]  albuterol (VENTOLIN HFA) 108 (90 Base) MCG/ACT inhaler Inhale 2 puffs into the lungs every 6 (six) hours as needed for wheezing or shortness of breath. 05/22/21   Shon Hale, MD  ascorbic acid (VITAMIN C) 500 MG tablet Take 1 tablet (500 mg total) by mouth daily. 05/23/21   Shon Hale, MD  budesonide (PULMICORT) 0.5 MG/2ML nebulizer solution Inhale 2 mL (0.5 mg total) by nebulization two (2) times a day as needed. 06/08/21     budesonide (PULMICORT) 0.5 MG/2ML nebulizer solution Inhale the contents of 1 vial via nebulizer two (2) times a day. 11/22/21     doxycycline (VIBRA-TABS) 100 MG tablet Take 1 tablet (100 mg total) by mouth 2 (two) times daily. 12/11/22   [name], MD  EPINEPHrine 0.3 mg/0.3 mL IJ SOAJ injection Inject 0.3 mg into the muscle as needed for anaphylaxis. 01/05/22   [name], MD  escitalopram (LEXAPRO) 10 MG tablet Take 1 tablet (10 mg total) by mouth daily. 07/30/22   [name], MD  ferrous sulfate 325 (65 FE) MG EC tablet Take  325 mg by mouth as needed.    [provider]  formoterol (PERFOROMIST) 20 MCG/2ML nebulizer solution Inhale the contents of 1 vial via nebulizer two (2) times a day. 11/22/21     hydrocortisone (CORTEF) 10 MG tablet Take 2 tablets by mouth at 8:00 am and 1 tablet at lunch. (May double dose on sick days x 3-5 days) 10/24/22   Roma Kayser, MD  hydrOXYzine (ATARAX) 25 MG tablet Take 1 tablet (25 mg total) by mouth 3 (three) times daily as needed for anxiety 11/27/22 11/27/23  [name], MD  levothyroxine (SYNTHROID) 50 MCG tablet Take 1 tablet by mouth daily before breakfast. 10/24/22 04/22/23  Roma Kayser, MD  linaclotide Karlene Einstein) 290 MCG CAPS capsule Take 1 capsule (290 mcg total) by mouth daily before breakfast. 03/28/22   [name], MD  mupirocin ointment (BACTROBAN) 2 % Apply in right nostril twice daily  08/22/22   [name], MD  ondansetron (ZOFRAN-ODT) 4 MG disintegrating tablet Take 1 tablet (4 mg total) by mouth every 8 (eight) hours as needed for nausea or vomiting. 03/02/22   Milagros Loll, MD  ondansetron (ZOFRAN-ODT) 8 MG disintegrating tablet Dissolve 1 tablet (8 mg total) by mouth every 8 (eight) hours as needed. 07/04/22 07/04/23  [name], MD  Oxycodone HCl 10 MG TABS Take 1 tablet (10 mg total) by mouth every 4 (four) hours as needed for pain. Max of 6 tablets daily. 09/10/22   [name], MD  Oxycodone HCl 10 MG TABS Take 1 tablet (10 mg total) by mouth every 4 (four) hours as needed for pain (max 6 tablets daily).Fill 08/31/22 09/10/22   [name], MD  Oxycodone HCl 10 MG TABS Take 1 tablet (10 mg total) by mouth every 4 (four) hours as needed for pain (max 6 tabs daily) 12/11/22   [name], MD  pantoprazole (PROTONIX) 40 MG tablet Take 1 tablet by mouth 2 times daily before a meal. 11/08/22 11/08/23  [name], MD  predniSONE (DELTASONE) 20 MG tablet 2 qd for 5d 09/26/22   [name], MD  predniSONE (DELTASONE) 5 MG  tablet Take 1 tablet (5 mg total) by mouth in the morning. 07/04/22     prochlorperazine (COMPAZINE) 10 MG tablet Take 1 tablet (10 mg total) by mouth every 8 (eight) hours as needed for nausea or vomiting. 10/23/22   Pollyann Savoy, MD  rosuvastatin (CRESTOR) 10 MG tablet Take 1 tablet (10 mg total) by mouth daily. 07/03/22   [name], MD  sucralfate (CARAFATE) 1 GM/10ML suspension Take 10 mLs (1 g total) by mouth 4 (four) times daily. 01/16/22   Malissa Hippo, MD    Physical Exam: Vitals:   12/12/22 1834 12/12/22 1900 12/12/22 1915 12/12/22 1930  BP:  139/73  (!) 156/77  Pulse:  77 78 93  Resp:  (!) 21 14 (!) 21  Temp:      TempSrc:      SpO2: 100% 100% 100% 99%  Weight:      Height:        Constitutional: Patient gittery Vitals:   12/12/22 1834 12/12/22 1900 12/12/22 1915 12/12/22 1930  BP:  139/73  (!) 156/77  Pulse:  77 78 93  Resp:  (!) 21 14 (!) 21  Temp:      TempSrc:      SpO2: 100% 100% 100% 99%  Weight:      Height:       Eyes: PERRL, lids and conjunctivae normal ENMT: Mucous membranes are moist.  Neck: normal, supple, no masses, no thyromegaly Respiratory: At the time of my evaluation clear to auscultation bilaterally, no wheezing, crackles on auscultation mostly left lung..  Otherwise normal respiratory effort. No accessory muscle use.  Cardiovascular: Regular rate and rhythm, no murmurs / rubs / gallops. No extremity edema.  Extremities warm. Abdomen: Distended, but this is her baseline, no tenderness, no masses palpated.  Musculoskeletal: no clubbing / cyanosis. No joint deformity upper and lower extremities.  Skin: no rashes, lesions, ulcers. No induration Neurologic: No apparent cranial nerve abnormality, moving extremities spontaneously.Marland Kitchen  Psychiatric: Normal judgment and insight. Alert and oriented x 3. Normal mood.   Labs on Admission: I have personally reviewed following labs and imaging studies  CBC: Recent Labs  Lab 12/12/22 1753  WBC  10.6*  NEUTROABS 9.0*  HGB 11.1*  HCT  35.6*  MCV 92.2  PLT 359   Basic Metabolic Panel: Recent Labs  Lab 12/12/22 1753  NA 138  K 4.2  CL 98  CO2 31  GLUCOSE 117*  BUN 9  CREATININE 0.71  CALCIUM 9.2     Radiological Exams on Admission: DG Chest Port 1 View  Result Date: 12/12/2022 CLINICAL DATA:  Shortness of breath. EXAM: PORTABLE CHEST 1 VIEW COMPARISON:  Chest radiograph dated 10/22/2022. FINDINGS: Background of emphysema and left upper lobe scarring. There are bibasilar densities which may represent atelectasis. Pneumonia is less likely but not excluded. No large pleural effusion. No pneumothorax. Stable cardiomediastinal silhouette. No acute osseous pathology. IMPRESSION: Bibasilar atelectasis versus less likely infiltrate. Electronically Signed   By: Elgie Collard M.D.   On: 12/12/2022 18:05    EKG: Independently reviewed.  Sinus rhythm, rate 79, initial EKG showing QTc of 620>>> repeat EKG his QTc in the 400s and 500s.  No significant ST or T wave abnormalities.  Assessment/Plan Principal Problem:   Interstitial lung disease Active Problems:   Chronic respiratory failure with hypoxia   Anxiety and depression   Sarcoidosis   Avascular necrosis of bone of hip, left (HCC)   Chronic pain syndrome   Pulmonary HTN   Adrenal insufficiency (Addison's disease)    Assessment and Plan: * Interstitial lung disease History of ILD, sarcoidosis, pulmonary hypertension.  Denies diagnosis of COPD, never smoked cigarettes.  Wheezing initially, she is status post bronchodilators, and steroids, on my evaluation-coarse appreciated, but no wheezing.  Possible flair of ILD with increased O2 demands, patient not comfortable going home. -COVID influenza RSV negative -IV Solu-Medrol 125 given by EMS, continue 60 twice daily -DuoNebs as needed and scheduled -IV doxycycline twice daily -Mucolytic's -Also reports intermittent dysuria, obtain UA   Chronic respiratory failure with  hypoxia On 3 L as needed and at night.  O2 sats down to 83% on room air.  Currently sats 90 to 100% on 3 L.  Anxiety and depression With fibromyalgia.  Patient anxious, jittery on exam. -Xanax 0.5 x 1 -Resume escitalopram, hydroxyzine,  Adrenal insufficiency (Addison's disease) Resume hydrocortisone, midodrine  Chronic pain syndrome Reports chronic left hip pain. -Resume home oxycodone every 4 hourly as needed   DVT prophylaxis: Lovenox Code Status: FULL- Confirmed with patient at bedside. Family Communication: None at bedside, Daughter Morrie Sheldon on the phone. Disposition Plan: ~ 1- 2 days Consults called: None  Admission status:  Obs Tele    Onnie Boer MD [facility]  12/12/2022, 8:01 PM    Author: Onnie Boer, MD 12/12/2022 8:01 PM  For on call review [email or web address].

## 2022-12-12 NOTE — ED Triage Notes (Signed)
Ems gave pt  of solumedrol,  albuterol and 0.5mg  of atrovent.

## 2022-12-12 NOTE — ED Notes (Signed)
Patient is being discharged from the Urgent Care and sent to the Emergency Department via EMS . Per NP, patient is in need of higher level of care due to shortness of breath/chest pain. Patient is aware and verbalizes understanding of plan of care.  Vitals:   12/12/22 1531  BP: (!) 144/79  Pulse: 93  Resp: (!) 28  Temp: 98 F (36.7 C)  SpO2: (!) 83%

## 2022-12-12 NOTE — Assessment & Plan Note (Addendum)
History of ILD, sarcoidosis, pulmonary hypertension.  Denies diagnosis of COPD, never smoked cigarettes.  Wheezing initially, she is status post bronchodilators, and steroids, on my evaluation-coarse appreciated, but no wheezing.  Possible flair of ILD with increased O2 demands, patient not comfortable going home. -COVID influenza RSV negative -IV Solu-Medrol 125 given by EMS, continue 60 twice daily -DuoNebs as needed and scheduled -IV doxycycline twice daily -Mucolytic's -Also reports intermittent dysuria, obtain UA

## 2022-12-12 NOTE — Assessment & Plan Note (Signed)
Resume hydrocortisone, midodrine

## 2022-12-12 NOTE — Assessment & Plan Note (Signed)
On 3 L as needed and at night.  O2 sats down to 83% on room air.  Currently sats 90 to 100% on 3 L.

## 2022-12-12 NOTE — ED Triage Notes (Addendum)
Pt reports shortness of breath and chest pain that increased in severity this afternoon.Audible wheezing, room air sat 83%. NP notified.   Pt reports wears 2L home 02 at baseline. Pt not wearing oxygen at time of presentation to UC.

## 2022-12-12 NOTE — ED Notes (Signed)
AC called regarding pt's medication. AC to bring to ED or to the floor

## 2022-12-12 NOTE — ED Notes (Signed)
RT at bedside giving last neb tx

## 2022-12-12 NOTE — ED Notes (Addendum)
Report given to [facility] at [facility] ED.

## 2022-12-12 NOTE — ED Provider Notes (Signed)
[facility]    CSN: [id number] Arrival date & time: 12/12/22  1525      History   Chief Complaint Chief Complaint  Patient presents with   Shortness of Breath    HPI Kathleen [name] is a 62 y.o. female.   Patient presents today to urgent care for a few day history of wheezing and shortness of breath.  Reports history of sarcoidosis and pulmonary fibrosis.  Reports she last used breathing medication last night.  Reports this afternoon, her breathing became severe and she came to urgent care.  She does not remember how she got here today.  After vital signs, patient started endorsing chest pain.    Past Medical History:  Diagnosis Date   Adrenal insufficiency    Anemia    Anxiety    Avascular bone necrosis    Breast fibrocystic disorder    Chronic pain    COPD (chronic obstructive pulmonary disease)    Encounter for long-term opiate analgesic use 02/02/2020   Patient has chronic pain with femoral head avascular necrosis.  Is under pain contract   Fibromyalgia    Gastroesophageal reflux disease    Hypothyroidism    Mitral valve prolapse    Orthostatic hypotension    Peripheral neuropathy    PONV (postoperative nausea and vomiting)    Restrictive lung disease    Moderate to severe   Sarcoidosis    Biopsy proven - [facility]   SOB (shortness of breath)    chronic    Patient Active Problem List   Diagnosis Date Noted   Depression, major, single episode, moderate 09/10/2022   Aortic atherosclerosis 09/10/2022   Anxiety and depression 05/22/2021   Vitamin D deficiency 05/09/2021   Left foot drop 01/09/2021   Pulmonary HTN 10/12/2020   Encounter for long-term opiate analgesic use 02/02/2020   Interstitial lung disease 04/14/2019   GERD (gastroesophageal reflux disease) 10/15/2017   Personal history of noncompliance with medical treatment, presenting hazards to health 04/26/2017   Hypothyroidism 01/15/2017   Adrenal insufficiency (Addison's disease) 08/22/2016    Symptomatic bradycardia    Chronic pain syndrome 04/20/2016   Irritable bowel syndrome with constipation 11/14/2015   Avascular necrosis of bone of hip, left (HCC) 04/05/2015   Post herpetic neuralgia 10/22/2014   Mixed hyperlipidemia 08/25/2014   Obstructive sleep apnea 02/18/2014   Class 2 severe obesity due to excess calories with serious comorbidity and body mass index (BMI) of 38.0 to 38.9 in adult 05/06/2013   Fibromyalgia 02/11/2013   Orthostatic hypotension 01/16/2013   Fibrocystic breast disease in female 04/09/2012   Sarcoidosis 06/21/2008    Past Surgical History:  Procedure Laterality Date   BIOPSY  10/17/2018   Procedure: BIOPSY;  Surgeon: Malissa Hippo, MD;  [facility];  Service: Endoscopy;;  duodenum, antrum, gastric body   BIOPSY  03/03/2021   Procedure: BIOPSY;  Surgeon: Malissa Hippo, MD;  [facility];  Service: Endoscopy;;   BREAST LUMPECTOMY Bilateral 01/12/2011   CHOLECYSTECTOMY N/A 04/17/2021   Procedure: LAPAROSCOPIC CHOLECYSTECTOMY;  Surgeon: Franky Macho, MD;  Location: [facility];  Service: General;  Laterality: N/A;   COLONOSCOPY WITH PROPOFOL N/A 01/03/2022   Procedure: COLONOSCOPY WITH PROPOFOL;  Surgeon: Malissa Hippo, MD;  [facility];  Service: Endoscopy;  Laterality: N/A;  220   ESOPHAGEAL DILATION N/A 03/03/2021   Procedure: ESOPHAGEAL DILATION;  Surgeon: Malissa Hippo, MD;  [facility];  Service: Endoscopy;  Laterality: N/A;  ESOPHAGOGASTRODUODENOSCOPY (EGD) WITH PROPOFOL N/A 10/17/2018   Procedure: ESOPHAGOGASTRODUODENOSCOPY (EGD) WITH PROPOFOL;  Surgeon: Malissa Hippo, MD;  [facility];  Service: Endoscopy;  Laterality: N/A;  2:25   ESOPHAGOGASTRODUODENOSCOPY (EGD) WITH PROPOFOL N/A 03/03/2021   Procedure: ESOPHAGOGASTRODUODENOSCOPY (EGD) WITH PROPOFOL;  Surgeon: Malissa Hippo, MD;  [facility];  Service: Endoscopy;  Laterality: N/A;  12:15   POLYPECTOMY   01/03/2022   Procedure: POLYPECTOMY INTESTINAL;  Surgeon: Malissa Hippo, MD;  [facility];  Service: Endoscopy;;   TUBAL LIGATION      OB History     Gravida  1   Para  1   Term  1   Preterm      AB      Living         SAB      IAB      Ectopic      Multiple      Live Births               Home Medications    Prior to Admission medications   Medication Sig Start Date End Date Taking? Authorizing Provider  acetaminophen (TYLENOL) 500 MG tablet Take 1,000 mg by mouth every 8 (eight) hours as needed for headache.    [provider]  albuterol (VENTOLIN HFA) 108 (90 Base) MCG/ACT inhaler Inhale 2 puffs into the lungs every 6 (six) hours as needed for wheezing or shortness of breath. 05/22/21   Shon Hale, MD  ascorbic acid (VITAMIN C) 500 MG tablet Take 1 tablet (500 mg total) by mouth daily. 05/23/21   Shon Hale, MD  budesonide (PULMICORT) 0.5 MG/2ML nebulizer solution Inhale 2 mL (0.5 mg total) by nebulization two (2) times a day as needed. 06/08/21     budesonide (PULMICORT) 0.5 MG/2ML nebulizer solution Inhale the contents of 1 vial via nebulizer two (2) times a day. 11/22/21     doxycycline (VIBRA-TABS) 100 MG tablet Take 1 tablet (100 mg total) by mouth 2 (two) times daily. 12/11/22   [name], MD  EPINEPHrine 0.3 mg/0.3 mL IJ SOAJ injection Inject 0.3 mg into the muscle as needed for anaphylaxis. 01/05/22   [name], MD  escitalopram (LEXAPRO) 10 MG tablet Take 1 tablet (10 mg total) by mouth daily. 07/30/22   [name], MD  ferrous sulfate 325 (65 FE) MG EC tablet Take 325 mg by mouth as needed.    [provider]  formoterol (PERFOROMIST) 20 MCG/2ML nebulizer solution Inhale the contents of 1 vial via nebulizer two (2) times a day. 11/22/21     hydrocortisone (CORTEF) 10 MG tablet Take 2 tablets by mouth at 8:00 am and 1 tablet at lunch. (May double dose on sick days x 3-5 days) 10/24/22   Roma Kayser, MD  hydrOXYzine (ATARAX) 25 MG tablet Take 1 tablet (25 mg total) by mouth 3 (three) times daily as needed for anxiety 11/27/22 11/27/23  [name], MD  levothyroxine (SYNTHROID) 50 MCG tablet Take 1 tablet by mouth daily before breakfast. 10/24/22 04/22/23  Roma Kayser, MD  linaclotide [facility]) 290 MCG CAPS capsule Take 1 capsule (290 mcg total) by mouth daily before breakfast. 03/28/22   [name], MD  mupirocin ointment (BACTROBAN) 2 % Apply in right nostril twice daily 08/22/22   Luking, Jonna Coup, MD  ondansetron (ZOFRAN-ODT) 4 MG disintegrating tablet Take 1 tablet (4 mg total) by mouth every 8 (eight) hours  as needed for nausea or vomiting. 03/02/22   Milagros Loll, MD  ondansetron (ZOFRAN-ODT) 8 MG disintegrating tablet Dissolve 1 tablet (8 mg total) by mouth every 8 (eight) hours as needed. 07/04/22 07/04/23  [name], MD  Oxycodone HCl 10 MG TABS Take 1 tablet (10 mg total) by mouth every 4 (four) hours as needed for pain. Max of 6 tablets daily. 09/10/22   [name], MD  Oxycodone HCl 10 MG TABS Take 1 tablet (10 mg total) by mouth every 4 (four) hours as needed for pain (max 6 tablets daily).Fill 08/31/22 09/10/22   [name], MD  Oxycodone HCl 10 MG TABS Take 1 tablet (10 mg total) by mouth every 4 (four) hours as needed for pain (max 6 tabs daily) 12/11/22   [name], MD  pantoprazole (PROTONIX) 40 MG tablet Take 1 tablet by mouth 2 times daily before a meal. 11/08/22 11/08/23  [name], MD  predniSONE (DELTASONE) 20 MG tablet 2 qd for 5d 09/26/22   [name], MD  predniSONE (DELTASONE) 5 MG tablet Take 1 tablet (5 mg total) by mouth in the morning. 07/04/22     prochlorperazine (COMPAZINE) 10 MG tablet Take 1 tablet (10 mg total) by mouth every 8 (eight) hours as needed for nausea or vomiting. 10/23/22   Pollyann Savoy, MD  rosuvastatin (CRESTOR) 10 MG tablet Take 1 tablet (10 mg total) by mouth daily. 07/03/22   [name], MD  sucralfate (CARAFATE) 1 GM/10ML suspension Take 10 mLs (1 g total) by mouth 4 (four) times daily. 01/16/22   Malissa Hippo, MD    Family History Family History  Problem Relation Age of Onset   Cardiomyopathy Mother    Breast cancer Mother    Prostate cancer Father    Heart attack Maternal Grandmother    Heart attack Maternal Grandfather    Heart attack Paternal Grandmother    Bipolar disorder Daughter    Heart disease Maternal Aunt    Neurofibromatosis Cousin    Colon cancer Neg Hx     Social History Social History   Tobacco Use   Smoking status: Never    Passive exposure: Never   Smokeless tobacco: Never  Vaping Use   Vaping Use: Never used  Substance Use Topics   Alcohol use: No   Drug use: No     Allergies   Bee venom, Contrast media [iodinated contrast media], Lactose intolerance (gi), Bactrim [sulfamethoxazole-trimethoprim], Codeine, and Morphine and related   Review of Systems Review of Systems Per HPI  Physical Exam Triage Vital Signs ED Triage Vitals [12/12/22 1531]  Enc Vitals Group     BP (!) 144/79     Pulse Rate 93     Resp (!) 28     Temp 98 F (36.7 C)     Temp Source Oral     SpO2 (!) 83 %     Weight      Height      Head Circumference      Peak Flow      Pain Score 4     Pain Loc      Pain Edu?      Excl. in GC?    No data found.  Updated Vital Signs BP (!) 144/79 (BP Location: Right Arm)   Pulse 93   Temp 98 F (36.7 C) (Oral)   Resp (!) 28   LMP 05/17/2016   SpO2 (!) 83%  Visual Acuity Right Eye Distance:   Left Eye Distance:   Bilateral Distance:    Right Eye Near:   Left Eye Near:    Bilateral Near:     Physical Exam Vitals and nursing note reviewed.  Constitutional:      General: She is not in acute distress.    Appearance: Normal appearance. She is not ill-appearing or toxic-appearing.  HENT:     Head: Normocephalic and atraumatic.     Mouth/Throat:     Mouth: Mucous membranes are moist.      Pharynx: Oropharynx is clear.  Cardiovascular:     Rate and Rhythm: Normal rate and regular rhythm.  Pulmonary:     Effort: Respiratory distress present.     Breath sounds: Normal breath sounds. No wheezing, rhonchi or rales.     Comments: Audible wheezes however lungs are clear to auscultation Musculoskeletal:     Cervical back: Normal range of motion and neck supple.  Skin:    General: Skin is warm and dry.     Coloration: Skin is pale. Skin is not jaundiced.     Findings: No erythema or rash.  Neurological:     Mental Status: She is alert and oriented to person, place, and time.  Psychiatric:        Behavior: Behavior is cooperative.      UC Treatments / Results  Labs (all labs ordered are listed, but only abnormal results are displayed) Labs Reviewed - No data to display  EKG   Radiology No results found.  Procedures Procedures (including critical care time)  Medications Ordered in UC Medications  ipratropium-albuterol (DUONEB) 0.5-2.5 (3) MG/3ML nebulizer solution 3 mL (3 mLs Nebulization Given 12/12/22 1530)    Initial Impression / Assessment and Plan / UC Course  I have reviewed the triage vital signs and the nursing notes.  Pertinent labs & imaging results that were available during my care of the patient were reviewed by me and considered in my medical decision making (see chart for details).   In triage today, patient is tachypneic and mildly hypertensive.  She has no fever and heart rate is normal.  SpO2 was 83% on room air.  Patient is in respiratory distress.  1. Chest pain, unspecified type 2. Shortness of breath 3. Wheezing Given vital signs, acutely ill-appearing, EMS activated DuoNeb breathing treatment given in urgent care today; EKG obtained does not show any ST segment or T wave elevations or depressions IV started in the right forearm EMS arrived to transport patient to emergency room; patient left urgent care in stable condition  The  patient was given the opportunity to ask questions.  All questions answered to their satisfaction.  The patient is in agreement to this plan.    Final Clinical Impressions(s) / UC Diagnoses   Final diagnoses:  Chest pain, unspecified type  Shortness of breath  Wheezing     Discharge Instructions      Please go to the ED for further evaluation and management of your breathing and chest pain     ED Prescriptions   None    PDMP not reviewed this encounter.   Valentino Nose, NP 12/12/22 [phone]

## 2022-12-12 NOTE — ED Notes (Signed)
ED Provider at bedside. 

## 2022-12-13 DIAGNOSIS — M797 Fibromyalgia: Secondary | ICD-10-CM | POA: Diagnosis present

## 2022-12-13 DIAGNOSIS — J9811 Atelectasis: Secondary | ICD-10-CM | POA: Diagnosis present

## 2022-12-13 DIAGNOSIS — J9621 Acute and chronic respiratory failure with hypoxia: Secondary | ICD-10-CM | POA: Diagnosis present

## 2022-12-13 DIAGNOSIS — Z8249 Family history of ischemic heart disease and other diseases of the circulatory system: Secondary | ICD-10-CM | POA: Diagnosis not present

## 2022-12-13 DIAGNOSIS — I341 Nonrheumatic mitral (valve) prolapse: Secondary | ICD-10-CM | POA: Diagnosis present

## 2022-12-13 DIAGNOSIS — M87152 Osteonecrosis due to drugs, left femur: Secondary | ICD-10-CM | POA: Diagnosis present

## 2022-12-13 DIAGNOSIS — Z7952 Long term (current) use of systemic steroids: Secondary | ICD-10-CM | POA: Diagnosis not present

## 2022-12-13 DIAGNOSIS — E669 Obesity, unspecified: Secondary | ICD-10-CM | POA: Diagnosis present

## 2022-12-13 DIAGNOSIS — E039 Hypothyroidism, unspecified: Secondary | ICD-10-CM | POA: Diagnosis present

## 2022-12-13 DIAGNOSIS — G894 Chronic pain syndrome: Secondary | ICD-10-CM | POA: Diagnosis present

## 2022-12-13 DIAGNOSIS — Z9981 Dependence on supplemental oxygen: Secondary | ICD-10-CM | POA: Diagnosis not present

## 2022-12-13 DIAGNOSIS — E785 Hyperlipidemia, unspecified: Secondary | ICD-10-CM | POA: Diagnosis present

## 2022-12-13 DIAGNOSIS — K219 Gastro-esophageal reflux disease without esophagitis: Secondary | ICD-10-CM | POA: Diagnosis present

## 2022-12-13 DIAGNOSIS — J441 Chronic obstructive pulmonary disease with (acute) exacerbation: Secondary | ICD-10-CM | POA: Diagnosis present

## 2022-12-13 DIAGNOSIS — J849 Interstitial pulmonary disease, unspecified: Secondary | ICD-10-CM | POA: Diagnosis not present

## 2022-12-13 DIAGNOSIS — F32A Depression, unspecified: Secondary | ICD-10-CM | POA: Diagnosis present

## 2022-12-13 DIAGNOSIS — G4733 Obstructive sleep apnea (adult) (pediatric): Secondary | ICD-10-CM | POA: Diagnosis present

## 2022-12-13 DIAGNOSIS — Z6839 Body mass index (BMI) 39.0-39.9, adult: Secondary | ICD-10-CM | POA: Diagnosis not present

## 2022-12-13 DIAGNOSIS — Z1152 Encounter for screening for COVID-19: Secondary | ICD-10-CM | POA: Diagnosis not present

## 2022-12-13 DIAGNOSIS — F419 Anxiety disorder, unspecified: Secondary | ICD-10-CM | POA: Diagnosis present

## 2022-12-13 DIAGNOSIS — T380X5A Adverse effect of glucocorticoids and synthetic analogues, initial encounter: Secondary | ICD-10-CM | POA: Diagnosis present

## 2022-12-13 DIAGNOSIS — J8410 Pulmonary fibrosis, unspecified: Secondary | ICD-10-CM | POA: Diagnosis present

## 2022-12-13 DIAGNOSIS — Y92009 Unspecified place in unspecified non-institutional (private) residence as the place of occurrence of the external cause: Secondary | ICD-10-CM | POA: Diagnosis not present

## 2022-12-13 DIAGNOSIS — E271 Primary adrenocortical insufficiency: Secondary | ICD-10-CM | POA: Diagnosis present

## 2022-12-13 DIAGNOSIS — I5032 Chronic diastolic (congestive) heart failure: Secondary | ICD-10-CM | POA: Diagnosis present

## 2022-12-13 DIAGNOSIS — I272 Pulmonary hypertension, unspecified: Secondary | ICD-10-CM | POA: Diagnosis present

## 2022-12-13 LAB — HIV ANTIBODY (ROUTINE TESTING W REFLEX): HIV Screen 4th Generation wRfx: NONREACTIVE

## 2022-12-13 MED ORDER — ENOXAPARIN SODIUM 60 MG/0.6ML IJ SOSY
60.0000 mg | PREFILLED_SYRINGE | Freq: Every day | INTRAMUSCULAR | Status: DC
  Administered 2022-12-13 – 2022-12-14 (×2): 60 mg via SUBCUTANEOUS
  Filled 2022-12-13 (×2): qty 0.6

## 2022-12-13 MED ORDER — ALPRAZOLAM 0.5 MG PO TABS
0.5000 mg | ORAL_TABLET | Freq: Two times a day (BID) | ORAL | Status: DC | PRN
  Administered 2022-12-13 – 2022-12-15 (×4): 0.5 mg via ORAL
  Filled 2022-12-13 (×4): qty 1

## 2022-12-13 NOTE — Progress Notes (Signed)
PROGRESS NOTE   Kathleen [name], is a 62 y.o. female, DOB - May 03, 1961, ZOX:[id number]  Admit date - 12/12/2022   Admitting Physician [name] [name], MD  Outpatient Primary MD for the patient is [name], MD  LOS - 0  Chief Complaint  Patient presents with   Shortness of Breath      Brief Narrative:   62 y.o. female with medical history significant for chronic respiratory failure on 3 L as needed and nightly, sarcoidosis, pulmonary fibrosis, COPD admitted on 12/12/22 with acute on chronic hypoxic respiratory failure secondary to pulmonary fibrosis flareup    -Assessment and Plan: 1) flareup of interstitial lung disease/pulmonary fibrosis as well as pulmonary hypertension in the setting of biopsy-proven sarcoidosis -Patient has Never smoked, she does Not have COPD --COVID, influenza and RSV negative -c/n IV Solu-Medrol 60 mg twice daily  -Continue bronchodilators, doxycycline and mucolytics -Patient sees Dr. Sela Hua pulmonologist at [facility]  2)Acute on Chronic respiratory failure with hypoxia On 3 L as needed and at night.   -Prior to admission patient uses oxygen mostly at night O2 sats down to 83% on room air.  -Patient currently requiring 3 L of oxygen via nasal cannula continuously not just at night anymore  3)Anxiety and depression/fibromyalgia -Resume escitalopram, hydroxyzine,  4)Adrenal insufficiency (Addison's disease)--- in the setting of chronic steroid use with episodes of hypotension C/n Midodrine -Patient is chronically on steroids  5)Chronic pain syndrome Reports chronic left hip pain presumably due to steroid-induced AVN of Hips -Resume home oxycodone every 4 hourly as needed  6)Hypothyroidism--- continue levothyroxine  7)HLD--- continue Crestor  8)Class 2 Obesity/OSA--CPAP nightly advised -Low calorie diet, portion control and increase physical activity discussed with patient -Body mass index is 39.82 kg/m.  Status is: Inpatient    Disposition: The patient is from: Home              Anticipated d/c is to: Home              Anticipated d/c date is: 2 days              Patient currently is not medically stable to d/c. Barriers: Not Clinically Stable-   Code Status :  -  Code Status: Full Code   Family Communication:    Discussed with pt's Husband at bedside  DVT Prophylaxis  :   - SCDs /Lovenox  Lab Results  Component Value Date   PLT 359 12/12/2022    Inpatient Medications  Scheduled Meds:  budesonide  0.5 mg Nebulization BID   enoxaparin (LOVENOX) injection  60 mg Subcutaneous QHS   escitalopram  10 mg Oral Daily   ipratropium-albuterol  3 mL Nebulization Q8H   levothyroxine  50 mcg Oral QAC breakfast   methylPREDNISolone (SOLU-MEDROL) injection  60 mg Intravenous Q12H   rosuvastatin  10 mg Oral Daily   Continuous Infusions:  doxycycline (VIBRAMYCIN) IV 100 mg (12/13/22 1011)   PRN Meds:.acetaminophen **OR** acetaminophen, guaiFENesin-dextromethorphan, hydrOXYzine, oxyCODONE, polyethylene glycol   Anti-infectives (From admission, onward)    Start     Dose/Rate Route Frequency Ordered Stop   12/12/22 2100  doxycycline (VIBRAMYCIN) 100 mg in sodium chloride 0.9 % 250 mL IVPB        100 mg 125 mL/hr over 120 Minutes Intravenous Every 12 hours 12/12/22 2001     12/12/22 2000  azithromycin (ZITHROMAX) tablet 500 mg  Status:  Discontinued        500 mg Oral  Once 12/12/22 1959 12/12/22 2001  Subjective: Terrie Scotto today has no fevers, no emesis,  No chest pain,   - Husband at bedside, -Cough dyspnea and wheezing persist   Objective: Vitals:   12/13/22 0536 12/13/22 0603 12/13/22 0717 12/13/22 1506  BP: (!) 104/49     Pulse: 60     Resp: 20     Temp: 98 F (36.7 C)     TempSrc: Oral     SpO2: 97% 99% 99% 99%  Weight:      Height:        Intake/Output Summary (Last 24 hours) at 12/13/2022 1611 Last data filed at 12/13/2022 0900 Gross per 24 hour  Intake 730 ml  Output  --  Net 730 ml   Filed Weights   12/12/22 1603 12/12/22 2123  Weight: 115 kg 118.8 kg    Physical Exam  Gen:- Awake Alert, dyspnea on exertion persist HEENT:- [place].AT, No sclera icterus Nose- [place] 3L/min  Neck-Supple Neck,No JVD,.  Lungs-diminished breath sounds, few scattered wheezes CV- S1, S2 normal, regular  Abd-  +ve B.Sounds, Abd Soft, No tenderness, increased truncal adiposity    Extremity/Skin:- No  edema, pedal pulses present  Psych-affect is flat, oriented x3 Neuro-no new focal deficits, no tremors  Data Reviewed: I have personally reviewed following labs and imaging studies  CBC: Recent Labs  Lab 12/12/22 1753  WBC 10.6*  NEUTROABS 9.0*  HGB 11.1*  HCT 35.6*  MCV 92.2  PLT 359   Basic Metabolic Panel: Recent Labs  Lab 12/12/22 1753  NA 138  K 4.2  CL 98  CO2 31  GLUCOSE 117*  BUN 9  CREATININE 0.71  CALCIUM 9.2  MG 2.3   GFR: Estimated Creatinine Clearance: 101.4 mL/min (by C-G formula based on SCr of 0.71 mg/dL).  Recent Results (from the past 240 hour(s))  Resp panel by RT-PCR (RSV, Flu A&B, Covid) Anterior Nasal Swab     Status: None   Collection Time: 12/12/22  6:27 PM   Specimen: Anterior Nasal Swab  Result Value Ref Range Status   SARS Coronavirus 2 by RT PCR NEGATIVE NEGATIVE Final    Comment: (NOTE) SARS-CoV-2 target nucleic acids are NOT DETECTED.  The SARS-CoV-2 RNA is generally detectable in upper respiratory specimens during the acute phase of infection. The lowest concentration of SARS-CoV-2 viral copies this assay can detect is 138 copies/mL. A negative result does not preclude SARS-Cov-2 infection and should not be used as the sole basis for treatment or other patient management decisions. A negative result may occur with  improper specimen collection/handling, submission of specimen other than nasopharyngeal swab, presence of viral mutation(s) within the areas targeted by this assay, and inadequate number of viral copies(<138  copies/mL). A negative result must be combined with clinical observations, patient history, and epidemiological information. The expected result is Negative.  Fact Sheet for Patients:  BloggerCourse.com  Fact Sheet for Healthcare Providers:  SeriousBroker.it  This test is no t yet approved or cleared by the [place] FDA and  has been authorized for detection and/or diagnosis of SARS-CoV-2 by FDA under an Emergency Use Authorization (EUA). This EUA will remain  in effect (meaning this test can be used) for the duration of the COVID-19 declaration under Section 564(b)(1) of the Act, 21 U.S.C.section 360bbb-3(b)(1), unless the authorization is terminated  or revoked sooner.       Influenza A by PCR NEGATIVE NEGATIVE Final   Influenza B by PCR NEGATIVE NEGATIVE Final    Comment: (NTyrea FrobergXpert Xpress SARS-CoV-2/FLU/RSV  plus assay is intended as an aid in the diagnosis of influenza from Nasopharyngeal swab specimens and should not be used as a sole basis for treatment. Nasal washings and aspirates are unacceptable for Xpert Xpress SARS-CoV-2/FLU/RSV testing.  Fact Sheet for Patients: BloggerCourse.com  Fact Sheet for Healthcare Providers: SeriousBroker.it  This test is not yet approved or cleared by the [place] FDA and has been authorized for detection and/or diagnosis of SARS-CoV-2 by FDA under an Emergency Use Authorization (EUA). This EUA will remain in effect (meaning this test can be used) for the duration of the COVID-19 declaration under Section 564(b)(1) of the Act, 21 U.S.C. section 360bbb-3(b)(1), unless the authorization is terminated or revoked.     Resp Syncytial Virus by PCR NEGATIVE NEGATIVE Final    Comment: (NOTE) Fact Sheet for Patients: BloggerCourse.com  Fact Sheet for Healthcare  Providers: SeriousBroker.it  This test is not yet approved or cleared by the [place] FDA and has been authorized for detection and/or diagnosis of SARS-CoV-2 by FDA under an Emergency Use Authorization (EUA). This EUA will remain in effect (meaning this test can be used) for the duration of the COVID-19 declaration under Section 564(b)(1) of the Act, 21 U.S.C. section 360bbb-3(b)(1), unless the authorization is terminated or revoked.  Performed at [facility], [address], [place], [place] [address]     Radiology Studies: [facility] 1 View  Result Date: 12/12/2022 CLINICAL DATA:  Shortness of breath. EXAM: PORTABLE CHEST 1 VIEW COMPARISON:  Chest radiograph dated 10/22/2022. FINDINGS: Background of emphysema and left upper lobe scarring. There are bibasilar densities which may represent atelectasis. Pneumonia is less likely but not excluded. No large pleural effusion. No pneumothorax. Stable cardiomediastinal silhouette. No acute osseous pathology. IMPRESSION: Bibasilar atelectasis versus less likely infiltrate. Electronically Signed   By: Elgie Collard M.D.   On: 12/12/2022 18:05    Scheduled Meds:  budesonide  0.5 mg Nebulization BID   enoxaparin (LOVENOX) injection  60 mg Subcutaneous QHS   escitalopram  10 mg Oral Daily   ipratropium-albuterol  3 mL Nebulization Q8H   levothyroxine  50 mcg Oral QAC breakfast   methylPREDNISolone (SOLU-MEDROL) injection  60 mg Intravenous Q12H   rosuvastatin  10 mg Oral Daily   Continuous Infusions:  doxycycline (VIBRAMYCIN) IV 100 mg (12/13/22 1011)    LOS: 0 days   Shon Hale M.D on 12/13/2022 at 4:11 PM  Go to [email or web address] - for contact info  [facility] - Office  [phone]  If 7PM-7AM, please contact night-coverage [email or web address] 12/13/2022, 4:11 PM

## 2022-12-13 NOTE — Progress Notes (Signed)
  Transition of Care [facility]) Screening Note   Patient Details  Name: Kathleen [name] Date of Birth: November 26, 1960   [facility] of Care [facility]) CM/SW Contact:    Annice Needy, LCSW Phone Number: 12/13/2022, 11:34 AM    Transition of Care Department [facility]) has reviewed patient and no TOC needs have been identified at this time. We will continue to monitor patient advancement through interdisciplinary progression rounds. If new patient transition needs arise, please place a TOC consult.

## 2022-12-13 NOTE — Progress Notes (Signed)
RT spoke with patient about the use of her CPAP/BIPAP history and she informed me that she doesn't wear a unit but wears 3lpm [place] at night. She stated she is claustrophobic and can't deal things on her face. RT inform patient that a unit is available if needed and RT will continue to monitor her.

## 2022-12-14 DIAGNOSIS — J849 Interstitial pulmonary disease, unspecified: Secondary | ICD-10-CM | POA: Diagnosis not present

## 2022-12-14 LAB — URINALYSIS, ROUTINE W REFLEX MICROSCOPIC
Bilirubin Urine: NEGATIVE
Glucose, UA: NEGATIVE mg/dL
Hgb urine dipstick: NEGATIVE
Ketones, ur: NEGATIVE mg/dL
Leukocytes,Ua: NEGATIVE
Nitrite: NEGATIVE
Protein, ur: NEGATIVE mg/dL
Specific Gravity, Urine: 1.014 (ref 1.005–1.030)
pH: 6 (ref 5.0–8.0)

## 2022-12-14 MED ORDER — DOXYCYCLINE HYCLATE 100 MG PO TABS
100.0000 mg | ORAL_TABLET | Freq: Two times a day (BID) | ORAL | Status: DC
  Administered 2022-12-14 – 2022-12-15 (×2): 100 mg via ORAL
  Filled 2022-12-14 (×2): qty 1

## 2022-12-14 MED ORDER — PANTOPRAZOLE SODIUM 40 MG PO TBEC
40.0000 mg | DELAYED_RELEASE_TABLET | Freq: Every day | ORAL | Status: DC
  Administered 2022-12-14 – 2022-12-15 (×2): 40 mg via ORAL
  Filled 2022-12-14 (×2): qty 1

## 2022-12-14 MED ORDER — CALCIUM CARBONATE ANTACID 500 MG PO CHEW
1.0000 | CHEWABLE_TABLET | Freq: Three times a day (TID) | ORAL | Status: DC
  Administered 2022-12-14 – 2022-12-15 (×3): 200 mg via ORAL
  Filled 2022-12-14 (×3): qty 1

## 2022-12-14 MED ORDER — IPRATROPIUM-ALBUTEROL 0.5-2.5 (3) MG/3ML IN SOLN
3.0000 mL | Freq: Once | RESPIRATORY_TRACT | Status: AC
  Administered 2022-12-14: 3 mL via RESPIRATORY_TRACT
  Filled 2022-12-14: qty 3

## 2022-12-14 NOTE — Care Management Important Message (Signed)
Important Message  Patient Details  Name: Kathleen [name] MRN: [id number] Date of Birth: 02-Sep-1960   Medicare Important Message Given:  N/A - LOS <3 / Initial given by admissions     Corey Harold 12/14/2022, 10:37 AM

## 2022-12-14 NOTE — Progress Notes (Signed)
Patient states that her hips her and ask her husband to hand her items from the side table because she states she cannot move. However, When the tech comes in to take vital signs she turned to her back. I encouraged patient to get out of bed and walk around the room. She is not receptive to this idea. Pain medication given per patient request.

## 2022-12-14 NOTE — Progress Notes (Signed)
PROGRESS NOTE   Kathleen [name], is a 62 y.o. female, DOB - 1961-08-03, WUJ:[id number]  Admit date - 12/12/2022   Admitting Physician Hanh Kertesz [name], MD  Outpatient Primary MD for the patient is [name], MD  LOS - 1  Chief Complaint  Patient presents with   Shortness of Breath      Brief Narrative:   62 y.o. female with medical history significant for chronic respiratory failure on 3 L as needed and nightly, sarcoidosis, pulmonary fibrosis, COPD admitted on 12/12/22 with acute on chronic hypoxic respiratory failure secondary to pulmonary fibrosis flareup    -Assessment and Plan: 1)Flareup of interstitial lung disease/pulmonary fibrosis as well as pulmonary hypertension in the setting of biopsy-proven sarcoidosis -Patient has Never smoked, she does Not have COPD --COVID, influenza and RSV negative -c/n IV Solu-Medrol 60 mg twice daily  -Continue bronchodilators, doxycycline and mucolytics -Patient sees Dr. Sela Hua pulmonologist at [facility] 12/14/22 -Dyspnea and hypoxia persist -Attempted to reach Dr Dudley Major at [phone] message for his Nurse  2)Acute on Chronic respiratory failure with hypoxia due to #1 above -Prior to admission patient uses up to 3 L of oxygen mostly at night -Patient currently requiring 3 L of oxygen via nasal cannula continuously not just at night anymore  3)Anxiety and depression/fibromyalgia -Resume escitalopram, hydroxyzine,  4)Adrenal insufficiency (Addison's disease)--- in the setting of chronic steroid use with episodes of hypotension C/n Midodrine -Patient is chronically on steroids at home -Currently on IV Solu-Medrol as above #1  5)Chronic pain syndrome Reports chronic left hip pain presumably due to steroid-induced AVN of Hips -Resume home oxycodone every 4 hourly as needed  6)Hypothyroidism--- continue levothyroxine  7)HLD--- continue Crestor  8)Class 2 Obesity/OSA--CPAP nightly advised -Low calorie diet, portion control and  increase physical activity discussed with patient -Body mass index is 39.82 kg/m.  Status is: Inpatient   Disposition: The patient is from: Home              Anticipated d/c is to: Home              Anticipated d/c date is: 2 days              Patient currently is not medically stable to d/c. Barriers: Not Clinically Stable-   Code Status :  -  Code Status: Full Code   Family Communication:    Discussed with pt's Husband at bedside  DVT Prophylaxis  :   - SCDs /Lovenox  Lab Results  Component Value Date   PLT 359 12/12/2022    Inpatient Medications  Scheduled Meds:  budesonide  0.5 mg Nebulization BID   calcium carbonate  1 tablet Oral TID   doxycycline  100 mg Oral Q12H   enoxaparin (LOVENOX) injection  60 mg Subcutaneous QHS   escitalopram  10 mg Oral Daily   levothyroxine  50 mcg Oral QAC breakfast   methylPREDNISolone (SOLU-MEDROL) injection  60 mg Intravenous Q12H   pantoprazole  40 mg Oral Daily   rosuvastatin  10 mg Oral Daily   Continuous Infusions:   PRN Meds:.acetaminophen **OR** acetaminophen, ALPRAZolam, guaiFENesin-dextromethorphan, hydrOXYzine, oxyCODONE, polyethylene glycol   Anti-infectives (From admission, onward)    Start     Dose/Rate Route Frequency Ordered Stop   12/14/22 1100  doxycycline (VIBRA-TABS) tablet 100 mg        100 mg Oral Every 12 hours 12/14/22 1003 12/19/22 0959   12/12/22 2100  doxycycline (VIBRAMYCIN) 100 mg in sodium chloride 0.9 % 250 mL IVPB  Status:  Discontinued        100 mg 125 mL/hr over 120 Minutes Intravenous Every 12 hours 12/12/22 2001 12/14/22 1003   12/12/22 2000  azithromycin (ZITHROMAX) tablet 500 mg  Status:  Discontinued        500 mg Oral  Once 12/12/22 1959 12/12/22 2001         Subjective: Kathleen [name] today has no fevers, no emesis,  No chest pain,   - Husband at bedside, -Patient is reluctant to get out of bed -Cough dyspnea and hypoxia persist   Objective: Vitals:   12/14/22 0821 12/14/22  1259 12/14/22 1312 12/14/22 1954  BP:   (!) 116/57 125/63  Pulse:   88 72  Resp:    20  Temp:   97.7 F (36.5 C) 97.6 F (36.4 C)  TempSrc:   Oral Oral  SpO2: 99% 97% 98% 97%  Weight:      Height:        Intake/Output Summary (Last 24 hours) at 12/14/2022 2121 Last data filed at 12/14/2022 1700 Gross per 24 hour  Intake 960 ml  Output --  Net 960 ml   Filed Weights   12/12/22 1603 12/12/22 2123  Weight: 115 kg 118.8 kg    Physical Exam  Gen:- Awake Alert, dyspnea on exertion persist HEENT:- [place].AT, No sclera icterus Nose- [place] 3L/min  Neck-Supple Neck,No JVD,.  Lungs-diminished breath sounds, few scattered wheezes CV- S1, S2 normal, regular  Abd-  +ve B.Sounds, Abd Soft, No tenderness, increased truncal adiposity    Extremity/Skin:- No  edema, pedal pulses present  Psych-affect is flat, oriented x3 Neuro-no new focal deficits, no tremors  Data Reviewed: I have personally reviewed following labs and imaging studies  CBC: Recent Labs  Lab 12/12/22 1753  WBC 10.6*  NEUTROABS 9.0*  HGB 11.1*  HCT 35.6*  MCV 92.2  PLT 359   Basic Metabolic Panel: Recent Labs  Lab 12/12/22 1753  NA 138  K 4.2  CL 98  CO2 31  GLUCOSE 117*  BUN 9  CREATININE 0.71  CALCIUM 9.2  MG 2.3   GFR: Estimated Creatinine Clearance: 101.4 mL/min (by C-G formula based on SCr of 0.71 mg/dL).  Recent Results (from the past 240 hour(s))  Resp panel by RT-PCR (RSV, Flu A&B, Covid) Anterior Nasal Swab     Status: None   Collection Time: 12/12/22  6:27 PM   Specimen: Anterior Nasal Swab  Result Value Ref Range Status   SARS Coronavirus 2 by RT PCR NEGATIVE NEGATIVE Final    Comment: (NOTE) SARS-CoV-2 target nucleic acids are NOT DETECTED.  The SARS-CoV-2 RNA is generally detectable in upper respiratory specimens during the acute phase of infection. The lowest concentration of SARS-CoV-2 viral copies this assay can detect is 138 copies/mL. A negative result does not preclude  SARS-Cov-2 infection and should not be used as the sole basis for treatment or other patient management decisions. A negative result may occur with  improper specimen collection/handling, submission of specimen other than nasopharyngeal swab, presence of viral mutation(s) within the areas targeted by this assay, and inadequate number of viral copies(<138 copies/mL). A negative result must be combined with clinical observations, patient history, and epidemiological information. The expected result is Negative.  Fact Sheet for Patients:  BloggerCourse.com  Fact Sheet for Healthcare Providers:  SeriousBroker.it  This test is no t yet approved or cleared by the [place] FDA and  has been authorized for detection and/or diagnosis of SARS-CoV-2 by FDA under an Emergency  Use Authorization (EUA). This EUA will remain  in effect (meaning this test can be used) for the duration of the COVID-19 declaration under Section 564(b)(1) of the Act, 21 U.S.C.section 360bbb-3(b)(1), unless the authorization is terminated  or revoked sooner.       Influenza A by PCR NEGATIVE NEGATIVE Final   Influenza B by PCR NEGATIVE NEGATIVE Final    Comment: (NOTE) The Xpert Xpress SARS-CoV-2/FLU/RSV plus assay is intended as an aid in the diagnosis of influenza from Nasopharyngeal swab specimens and should not be used as a sole basis for treatment. Nasal washings and aspirates are unacceptable for Xpert Xpress SARS-CoV-2/FLU/RSV testing.  Fact Sheet for Patients: BloggerCourse.com  Fact Sheet for Healthcare Providers: SeriousBroker.it  This test is not yet approved or cleared by the [place] FDA and has been authorized for detection and/or diagnosis of SARS-CoV-2 by FDA under an Emergency Use Authorization (EUA). This EUA will remain in effect (meaning this test can be used) for the duration of  the COVID-19 declaration under Section 564(b)(1) of the Act, 21 U.S.C. section 360bbb-3(b)(1), unless the authorization is terminated or revoked.     Resp Syncytial Virus by PCR NEGATIVE NEGATIVE Final    Comment: (NOTE) Fact Sheet for Patients: BloggerCourse.com  Fact Sheet for Healthcare Providers: SeriousBroker.it  This test is not yet approved or cleared by the [place] FDA and has been authorized for detection and/or diagnosis of SARS-CoV-2 by FDA under an Emergency Use Authorization (EUA). This EUA will remain in effect (meaning this test can be used) for the duration of the COVID-19 declaration under Section 564(b)(1) of the Act, 21 U.S.C. section 360bbb-3(b)(1), unless the authorization is terminated or revoked.  Performed at [facility], [address] [place], [place] [address]     Radiology Studies: No results found.  Scheduled Meds:  budesonide  0.5 mg Nebulization BID   calcium carbonate  1 tablet Oral TID   doxycycline  100 mg Oral Q12H   enoxaparin (LOVENOX) injection  60 mg Subcutaneous QHS   escitalopram  10 mg Oral Daily   levothyroxine  50 mcg Oral QAC breakfast   methylPREDNISolone (SOLU-MEDROL) injection  60 mg Intravenous Q12H   pantoprazole  40 mg Oral Daily   rosuvastatin  10 mg Oral Daily   Continuous Infusions:    LOS: 1 day   Shon Hale M.D on 12/14/2022 at 9:21 PM  Go to [email or web address] - for contact info  [facility] - Office  [phone]  If 7PM-7AM, please contact night-coverage [email or web address] 12/14/2022, 9:21 PM

## 2022-12-15 DIAGNOSIS — J849 Interstitial pulmonary disease, unspecified: Secondary | ICD-10-CM | POA: Diagnosis not present

## 2022-12-15 MED ORDER — ALBUTEROL SULFATE (2.5 MG/3ML) 0.083% IN NEBU: 2.5000 mg | INHALATION_SOLUTION | RESPIRATORY_TRACT | 2 refills | Status: DC | PRN

## 2022-12-15 MED ORDER — FORMOTEROL FUMARATE 20 MCG/2ML IN NEBU: INHALATION_SOLUTION | RESPIRATORY_TRACT | 3 refills | Status: DC

## 2022-12-15 MED ORDER — ALBUTEROL SULFATE HFA 108 (90 BASE) MCG/ACT IN AERS: 2.0000 | INHALATION_SPRAY | Freq: Four times a day (QID) | RESPIRATORY_TRACT | 1 refills | Status: DC | PRN

## 2022-12-15 MED ORDER — PREDNISONE 10 MG PO TABS: 10.0000 mg | ORAL_TABLET | ORAL | 0 refills | Status: DC

## 2022-12-15 MED ORDER — DOXYCYCLINE HYCLATE 100 MG PO TABS: 100.0000 mg | ORAL_TABLET | Freq: Two times a day (BID) | ORAL | 0 refills | Status: AC

## 2022-12-15 MED ORDER — HYDROXYZINE HCL 25 MG PO TABS: 25.0000 mg | ORAL_TABLET | Freq: Three times a day (TID) | ORAL | 1 refills | Status: DC | PRN

## 2022-12-15 MED ORDER — PANTOPRAZOLE SODIUM 40 MG PO TBEC
40.0000 mg | DELAYED_RELEASE_TABLET | Freq: Two times a day (BID) | ORAL | 5 refills | Status: DC
  Filled 2023-03-28: qty 60, 30d supply, fill #0
  Filled 2023-04-27: qty 60, 30d supply, fill #1
  Filled 2023-07-24: qty 60, 30d supply, fill #2

## 2022-12-15 MED ORDER — ALPRAZOLAM 0.5 MG PO TABS: 0.5000 mg | ORAL_TABLET | Freq: Two times a day (BID) | ORAL | 0 refills | Status: DC | PRN

## 2022-12-15 NOTE — Progress Notes (Signed)
Nsg Discharge Note  Admit Date:  12/12/2022 Discharge date: 12/15/2022   Kathleen [name] to be D/C'd Home per MD order.  AVS completed.  Patient/caregiver able to verbalize understanding.  Discharge Medication: Allergies as of 12/15/2022       Reactions   Bee Venom Anaphylaxis   Contrast Media [iodinated Contrast Media]    CT chest w/ contrast at OSH followed by SOB resolved w/ single dose steroids, never ENT swelling, intubation or pressors, has had multiple contrasted scans since   Lactose Intolerance (gi)    GI Upset   Bactrim [sulfamethoxazole-trimethoprim] Nausea And Vomiting   Codeine Itching   Morphine And Related Other (See Comments)   Unknown reaction- pt gets sick, dizzy, and confused Intolerance, not an allergy, received this in the emergency department recently without a problem.        Medication List     STOP taking these medications    mupirocin ointment 2 % Commonly known as: BACTROBAN   prochlorperazine 10 MG tablet Commonly known as: COMPAZINE   sucralfate 1 GM/10ML suspension Commonly known as: CARAFATE       TAKE these medications    acetaminophen 500 MG tablet Commonly known as: TYLENOL Take 1,000 mg by mouth every 8 (eight) hours as needed for headache.   albuterol 108 (90 Base) MCG/ACT inhaler Commonly known as: VENTOLIN HFA Inhale 2 puffs into the lungs every 6 (six) hours as needed for wheezing or shortness of breath. What changed: Another medication with the same name was added. Make sure you understand how and when to take each.   albuterol (2.5 MG/3ML) 0.083% nebulizer solution Commonly known as: PROVENTIL Take 3 mLs (2.5 mg total) by nebulization every 4 (four) hours as needed for wheezing or shortness of breath. What changed: You were already taking a medication with the same name, and this prescription was added. Make sure you understand how and when to take each.   ALPRAZolam 0.5 MG tablet Commonly known as: Xanax Take 1 tablet  (0.5 mg total) by mouth 2 (two) times daily as needed for anxiety.   ascorbic acid 500 MG tablet Commonly known as: VITAMIN C Take 1 tablet (500 mg total) by mouth daily.   budesonide 0.5 MG/2ML nebulizer solution Commonly known as: PULMICORT Inhale the contents of 1 vial via nebulizer two (2) times a day. What changed: Another medication with the same name was removed. Continue taking this medication, and follow the directions you see here.   doxycycline 100 MG tablet Commonly known as: VIBRA-TABS Take 1 tablet (100 mg total) by mouth 2 (two) times daily for 5 days.   EPINEPHrine 0.3 mg/0.3 mL Soaj injection Commonly known as: EPI-PEN Inject 0.3 mg into the muscle as needed for anaphylaxis.   escitalopram 10 MG tablet Commonly known as: Lexapro Take 1 tablet (10 mg total) by mouth daily.   ferrous sulfate 325 (65 FE) MG EC tablet Take 325 mg by mouth as needed.   formoterol 20 MCG/2ML nebulizer solution Commonly known as: PERFOROMIST Inhale the contents of 1 vial via nebulizer two (2) times a day.   hydrocortisone 10 MG tablet Commonly known as: CORTEF Take 2 tablets by mouth at 8:00 am and 1 tablet at lunch. (May double dose on sick days x 3-5 days)   hydrOXYzine 25 MG tablet Commonly known as: ATARAX Take 1 tablet (25 mg total) by mouth 3 (three) times daily as needed for anxiety.   levothyroxine 50 MCG tablet Commonly known as: SYNTHROID Take  1 tablet by mouth daily before breakfast.   Linzess 290 MCG Caps capsule Generic drug: linaclotide Take 1 capsule (290 mcg total) by mouth daily before breakfast.   ondansetron 4 MG disintegrating tablet Commonly known as: ZOFRAN-ODT Take 1 tablet (4 mg total) by mouth every 8 (eight) hours as needed for nausea or vomiting. What changed: Another medication with the same name was removed. Continue taking this medication, and follow the directions you see here.   Oxycodone HCl 10 MG Tabs Take 1 tablet (10 mg total) by mouth  every 4 (four) hours as needed for pain. Max of 6 tablets daily. What changed: Another medication with the same name was removed. Continue taking this medication, and follow the directions you see here.   pantoprazole 40 MG tablet Commonly known as: PROTONIX Take 1 tablet (40 mg total) by mouth 2 (two) times daily before a meal.   predniSONE 5 MG tablet Commonly known as: DELTASONE Take 1 tablet (5 mg total) by mouth in the morning. What changed: how much to take   predniSONE 10 MG tablet Commonly known as: DELTASONE Take 1 tablet (10 mg total) by mouth See admin instructions. Take Prednisone 60 mg (6 tab) daily for 3 days, then 5 tab (50 mg) daily x 3 days , then Prednisone  (4 tabs) daily x 3 days, then  (3 tabs) x 3 days , then 20 mg (2 tabs) daily x 3 days, then  daily indefinitely until you see Dr. Dudley Major What changed:  medication strength how much to take how to take this when to take this additional instructions   rosuvastatin 10 MG tablet Commonly known as: Crestor Take 1 tablet (10 mg total) by mouth daily.        Discharge Assessment: Vitals:   12/15/22 0401 12/15/22 0832  BP: (!) 124/58   Pulse: (!) 56   Resp: 17   Temp: 98 F (36.7 C)   SpO2: 98% 97%   Skin clean, dry and intact without evidence of skin break down, no evidence of skin tears noted. IV catheter discontinued intact. Site without signs and symptoms of complications - no redness or edema noted at insertion site, patient denies c/o pain - only slight tenderness at site.  Dressing with slight pressure applied.  D/c Instructions-Education: Discharge instructions given to patient/family with verbalized understanding. D/c education completed with patient/family including follow up instructions, medication list, d/c activities limitations if indicated, with other d/c instructions as indicated by MD - patient able to verbalize understanding, all questions fully answered. Patient instructed to  return to ED, call 911, or call MD for any changes in condition.  Patient escorted via WC, and D/C home via private auto.  Laurena Spies, RN 12/15/2022 11:50 AM

## 2022-12-15 NOTE — Discharge Summary (Signed)
Kathleen [name], is a 62 y.o. female  DOB Jul 10, 1961  MRN [id number].  Admission date:  12/12/2022  Admitting Physician  Shon Hale, MD  Discharge Date:  12/15/2022   Primary MD  [name], MD  Recommendations for primary care physician for things to follow:  1)Take Prednisone 60 mg (6 tab) daily for 3 days, then 5 tab (50 mg) daily x 3 days , then Prednisone 40mg  (4 tabs) daily x 3 days, then 30mg  (3 tabs) x 3 days , then 20 mg (2 tabs) daily x 3 days, then 10mg  daily indefinitely until you see Dr. Dudley Major  2) please keep your appointment with Dr. Dudley Major as previously scheduled for Monday Dec 31, 2022  3) please note that there has been some changes to your medications including a slow prednisone taper as prescribed  4) incentive spirometry as advised  5)you need oxygen at home up to 3 L via nasal cannula with activity and with sleep, otherwise 1 to 2 L continuously while awake and resting --- smoking or having open fires around oxygen can cause fire, significant injury and death  6)Avoid ibuprofen/Advil/Aleve/Motrin/Goody Powders/Naproxen/BC powders/Meloxicam/Diclofenac/Indomethacin and other Nonsteroidal anti-inflammatory medications as these will make you more likely to bleed and can cause stomach ulcers, can also cause Kidney problems.   Admission Diagnosis  Pulmonary fibrosis [J84.10] Acute exacerbation of chronic obstructive pulmonary disease (COPD) [J44.1] COPD with acute exacerbation [J44.1] Acute on chronic hypoxic respiratory failure [J96.21]   Discharge Diagnosis  Pulmonary fibrosis [J84.10] Acute exacerbation of chronic obstructive pulmonary disease (COPD) [J44.1] COPD with acute exacerbation [J44.1] Acute on chronic hypoxic respiratory failure [J96.21]  ***  Principal Problem:   Interstitial lung disease Active Problems:   Chronic respiratory failure with hypoxia   Anxiety and  depression   Sarcoidosis   Avascular necrosis of bone of hip, left (HCC)   Chronic pain syndrome   Hypothyroidism   GERD (gastroesophageal reflux disease)   Pulmonary HTN   Adrenal insufficiency (Addison's disease)   Acute on chronic hypoxic respiratory failure      Past Medical History:  Diagnosis Date   Adrenal insufficiency    Anemia    Anxiety    Avascular bone necrosis    Breast fibrocystic disorder    Chronic pain    COPD (chronic obstructive pulmonary disease)    Encounter for long-term opiate analgesic use 02/02/2020   Patient has chronic pain with femoral head avascular necrosis.  Is under pain contract   Fibromyalgia    Gastroesophageal reflux disease    Hypothyroidism    Mitral valve prolapse    Orthostatic hypotension    Peripheral neuropathy    PONV (postoperative nausea and vomiting)    Restrictive lung disease    Moderate to severe   Sarcoidosis    Biopsy proven - [facility]   SOB (shortness of breath)    chronic    Past Surgical History:  Procedure Laterality Date   BIOPSY  10/17/2018   Procedure: BIOPSY;  Surgeon: Malissa Hippo, MD;  [facility]: [facility];  Service: Endoscopy;;  duodenum, antrum, gastric body   BIOPSY  03/03/2021   Procedure: BIOPSY;  Surgeon: Malissa Hippo, MD;  Location: [facility];  Service: Endoscopy;;   BREAST LUMPECTOMY Bilateral 01/12/2011   CHOLECYSTECTOMY N/A 04/17/2021   Procedure: LAPAROSCOPIC CHOLECYSTECTOMY;  Surgeon: Franky Macho, MD;  Location: [facility];  Service: General;  Laterality: N/A;   COLONOSCOPY WITH PROPOFOL N/A 01/03/2022   Procedure: COLONOSCOPY WITH PROPOFOL;  Surgeon: Malissa Hippo, MD;  [facility]: [facility];  Service: Endoscopy;  Laterality: N/A;  220   ESOPHAGEAL DILATION N/A 03/03/2021   Procedure: ESOPHAGEAL DILATION;  Surgeon: Malissa Hippo, MD;  [facility]: [facility];  Service: Endoscopy;  Laterality: N/A;   ESOPHAGOGASTRODUODENOSCOPY (EGD) WITH PROPOFOL N/A 10/17/2018   Procedure:  ESOPHAGOGASTRODUODENOSCOPY (EGD) WITH PROPOFOL;  Surgeon: Malissa Hippo, MD;  [facility]: [facility];  Service: Endoscopy;  Laterality: N/A;  2:25   ESOPHAGOGASTRODUODENOSCOPY (EGD) WITH PROPOFOL N/A 03/03/2021   Procedure: ESOPHAGOGASTRODUODENOSCOPY (EGD) WITH PROPOFOL;  Surgeon: Malissa Hippo, MD;  [facility]: [facility];  Service: Endoscopy;  Laterality: N/A;  12:15   POLYPECTOMY  01/03/2022   Procedure: POLYPECTOMY INTESTINAL;  Surgeon: Malissa Hippo, MD;  [facility]: [facility];  Service: Endoscopy;;   TUBAL LIGATION         HPI  from the history and physical done on the day of admission:     ***  ****     Hospital Course:     No notes on file  ***** Assessment and Plan: * Interstitial lung disease History of ILD, sarcoidosis, pulmonary hypertension.  Denies diagnosis of COPD, never smoked cigarettes.  Wheezing initially, she is status post bronchodilators, and steroids, on my evaluation-coarse appreciated, but no wheezing.  Possible flair of ILD with increased O2 demands, patient not comfortable going home. -COVID influenza RSV negative -IV Solu-Medrol 125 given by EMS, continue 60 twice daily -DuoNebs as needed and scheduled -IV doxycycline twice daily -Mucolytic's -Also reports intermittent dysuria, obtain UA   Chronic respiratory failure with hypoxia On 3 L as needed and at night.  O2 sats down to 83% on room air.  Currently sats 90 to 100% on 3 L.  Anxiety and depression With fibromyalgia.  Patient anxious, jittery on exam. -Xanax 0.5 x 1 -Resume escitalopram, hydroxyzine,  Adrenal insufficiency (Addison's disease) Resume hydrocortisone, midodrine  Chronic pain syndrome Reports chronic left hip pain. -Resume home oxycodone every 4 hourly as needed        Discharge Condition: ***  Follow UP     Consults obtained - ***  Diet and Activity recommendation:  As advised  Discharge Instructions    **** Discharge Instructions      Call MD for:  difficulty breathing, headache or visual disturbances   Complete by: As directed    Call MD for:  persistant dizziness or light-headedness   Complete by: As directed    Call MD for:  persistant nausea and vomiting   Complete by: As directed    Call MD for:  temperature >100.4   Complete by: As directed    Diet - low sodium heart healthy   Complete by: As directed    Discharge instructions   Complete by: As directed    1)Take Prednisone 60 mg (6 tab) daily for 3 days, then 5 tab (50 mg) daily x 3 days , then Prednisone 40mg  (4 tabs) daily x 3 days, then 30mg  (3 tabs) x 3 days ,  then 20 mg (2 tabs) daily x 3 days, then 10mg  daily indefinitely until you see Dr. Dudley Major  2) please keep your appointment with Dr. Dudley Major as previously scheduled for Monday Dec 31, 2022  3) please note that there has been some changes to your medications including a slow prednisone taper as prescribed  4) incentive spirometry as advised  5)you need oxygen at home up to 3 L via nasal cannula with activity and with sleep, otherwise 1 to 2 L continuously while awake and resting --- smoking or having open fires around oxygen can cause fire, significant injury and death  6)Avoid ibuprofen/Advil/Aleve/Motrin/Goody Powders/Naproxen/BC powders/Meloxicam/Diclofenac/Indomethacin and other Nonsteroidal anti-inflammatory medications as these will make you more likely to bleed and can cause stomach ulcers, can also cause Kidney problems.   Increase activity slowly   Complete by: As directed          Discharge Medications     Allergies as of 12/15/2022       Reactions   Bee Venom Anaphylaxis   Contrast Media [iodinated Contrast Media]    CT chest w/ contrast at OSH followed by SOB resolved w/ single dose steroids, never ENT swelling, intubation or pressors, has had multiple contrasted scans since   Lactose Intolerance (gi)    GI Upset   Bactrim [sulfamethoxazole-trimethoprim] Nausea And Vomiting   Codeine  Itching   Morphine And Related Other (See Comments)   Unknown reaction- pt gets sick, dizzy, and confused Intolerance, not an allergy, received this in the emergency department recently without a problem.        Medication List     STOP taking these medications    mupirocin ointment 2 % Commonly known as: BACTROBAN   prochlorperazine 10 MG tablet Commonly known as: COMPAZINE   sucralfate 1 GM/10ML suspension Commonly known as: CARAFATE       TAKE these medications    acetaminophen 500 MG tablet Commonly known as: TYLENOL Take 1,000 mg by mouth every 8 (eight) hours as needed for headache.   albuterol 108 (90 Base) MCG/ACT inhaler Commonly known as: VENTOLIN HFA Inhale 2 puffs into the lungs every 6 (six) hours as needed for wheezing or shortness of breath. What changed: Another medication with the same name was added. Make sure you understand how and when to take each.   albuterol (2.5 MG/3ML) 0.083% nebulizer solution Commonly known as: PROVENTIL Take 3 mLs (2.5 mg total) by nebulization every 4 (four) hours as needed for wheezing or shortness of breath. What changed: You were already taking a medication with the same name, and this prescription was added. Make sure you understand how and when to take each.   ALPRAZolam 0.5 MG tablet Commonly known as: Xanax Take 1 tablet (0.5 mg total) by mouth 2 (two) times daily as needed for anxiety.   ascorbic acid 500 MG tablet Commonly known as: VITAMIN C Take 1 tablet (500 mg total) by mouth daily.   budesonide 0.5 MG/2ML nebulizer solution Commonly known as: PULMICORT Inhale the contents of 1 vial via nebulizer two (2) times a day. What changed: Another medication with the same name was removed. Continue taking this medication, and follow the directions you see here.   doxycycline 100 MG tablet Commonly known as: VIBRA-TABS Take 1 tablet (100 mg total) by mouth 2 (two) times daily for 5 days.   EPINEPHrine 0.3  mg/0.3 mL Soaj injection Commonly known as: EPI-PEN Inject 0.3 mg into the muscle as needed for anaphylaxis.   escitalopram 10  MG tablet Commonly known as: Lexapro Take 1 tablet (10 mg total) by mouth daily.   ferrous sulfate 325 (65 FE) MG EC tablet Take 325 mg by mouth as needed.   formoterol 20 MCG/2ML nebulizer solution Commonly known as: PERFOROMIST Inhale the contents of 1 vial via nebulizer two (2) times a day.   hydrocortisone 10 MG tablet Commonly known as: CORTEF Take 2 tablets by mouth at 8:00 am and 1 tablet at lunch. (May double dose on sick days x 3-5 days)   hydrOXYzine 25 MG tablet Commonly known as: ATARAX Take 1 tablet (25 mg total) by mouth 3 (three) times daily as needed for anxiety.   levothyroxine 50 MCG tablet Commonly known as: SYNTHROID Take 1 tablet by mouth daily before breakfast.   Linzess 290 MCG Caps capsule Generic drug: linaclotide Take 1 capsule (290 mcg total) by mouth daily before breakfast.   ondansetron 4 MG disintegrating tablet Commonly known as: ZOFRAN-ODT Take 1 tablet (4 mg total) by mouth every 8 (eight) hours as needed for nausea or vomiting. What changed: Another medication with the same name was removed. Continue taking this medication, and follow the directions you see here.   Oxycodone HCl 10 MG Tabs Take 1 tablet (10 mg total) by mouth every 4 (four) hours as needed for pain. Max of 6 tablets daily. What changed: Another medication with the same name was removed. Continue taking this medication, and follow the directions you see here.   pantoprazole 40 MG tablet Commonly known as: PROTONIX Take 1 tablet (40 mg total) by mouth 2 (two) times daily before a meal.   predniSONE 5 MG tablet Commonly known as: DELTASONE Take 1 tablet (5 mg total) by mouth in the morning. What changed: how much to take   predniSONE 10 MG tablet Commonly known as: DELTASONE Take 1 tablet (10 mg total) by mouth See admin instructions. Take  Prednisone 60 mg (6 tab) daily for 3 days, then 5 tab (50 mg) daily x 3 days , then Prednisone 40mg  (4 tabs) daily x 3 days, then 30mg  (3 tabs) x 3 days , then 20 mg (2 tabs) daily x 3 days, then 10mg  daily indefinitely until you see Dr. Dudley Major What changed:  medication strength how much to take how to take this when to take this additional instructions   rosuvastatin 10 MG tablet Commonly known as: Crestor Take 1 tablet (10 mg total) by mouth daily.        Major procedures and Radiology Reports - PLEASE review detailed and final reports for all details, in brief -   ***  DG Chest Port 1 View  Result Date: 12/12/2022 CLINICAL DATA:  Shortness of breath. EXAM: PORTABLE CHEST 1 VIEW COMPARISON:  Chest radiograph dated 10/22/2022. FINDINGS: Background of emphysema and left upper lobe scarring. There are bibasilar densities which may represent atelectasis. Pneumonia is less likely but not excluded. No large pleural effusion. No pneumothorax. Stable cardiomediastinal silhouette. No acute osseous pathology. IMPRESSION: Bibasilar atelectasis versus less likely infiltrate. Electronically Signed   By: Elgie Collard M.D.   On: 12/12/2022 18:05    Micro Results   *** Recent Results (from the past 240 hour(s))  Resp panel by RT-PCR (RSV, Flu A&B, Covid) Anterior Nasal Swab     Status: None   Collection Time: 12/12/22  6:27 PM   Specimen: Anterior Nasal Swab  Result Value Ref Range Status   SARS Coronavirus 2 by RT PCR NEGATIVE NEGATIVE Final    Comment: (  NOTE) SARS-CoV-2 target nucleic acids are NOT DETECTED.  The SARS-CoV-2 RNA is generally detectable in upper respiratory specimens during the acute phase of infection. The lowest concentration of SARS-CoV-2 viral copies this assay can detect is 138 copies/mL. A negative result does not preclude SARS-Cov-2 infection and should not be used as the sole basis for treatment or other patient management decisions. A negative result may occur  with  improper specimen collection/handling, submission of specimen other than nasopharyngeal swab, presence of viral mutation(s) within the areas targeted by this assay, and inadequate number of viral copies(<138 copies/mL). A negative result must be combined with clinical observations, patient history, and epidemiological information. The expected result is Negative.  Fact Sheet for Patients:  BloggerCourse.com  Fact Sheet for Healthcare Providers:  SeriousBroker.it  This test is no t yet approved or cleared by the [place] FDA and  has been authorized for detection and/or diagnosis of SARS-CoV-2 by FDA under an Emergency Use Authorization (EUA). This EUA will remain  in effect (meaning this test can be used) for the duration of the COVID-19 declaration under Section 564(b)(1) of the Act, 21 U.S.C.section 360bbb-3(b)(1), unless the authorization is terminated  or revoked sooner.       Influenza A by PCR NEGATIVE NEGATIVE Final   Influenza B by PCR NEGATIVE NEGATIVE Final    Comment: (NOTE) The Xpert Xpress SARS-CoV-2/FLU/RSV plus assay is intended as an aid in the diagnosis of influenza from Nasopharyngeal swab specimens and should not be used as a sole basis for treatment. Nasal washings and aspirates are unacceptable for Xpert Xpress SARS-CoV-2/FLU/RSV testing.  Fact Sheet for Patients: BloggerCourse.com  Fact Sheet for Healthcare Providers: SeriousBroker.it  This test is not yet approved or cleared by the [place] FDA and has been authorized for detection and/or diagnosis of SARS-CoV-2 by FDA under an Emergency Use Authorization (EUA). This EUA will remain in effect (meaning this test can be used) for the duration of the COVID-19 declaration under Section 564(b)(1) of the Act, 21 U.S.C. section 360bbb-3(b)(1), unless the authorization is terminated  or revoked.     Resp Syncytial Virus by PCR NEGATIVE NEGATIVE Final    Comment: (NOTE) Fact Sheet for Patients: BloggerCourse.com  Fact Sheet for Healthcare Providers: SeriousBroker.it  This test is not yet approved or cleared by the [place] FDA and has been authorized for detection and/or diagnosis of SARS-CoV-2 by FDA under an Emergency Use Authorization (EUA). This EUA will remain in effect (meaning this test can be used) for the duration of the COVID-19 declaration under Section 564(b)(1) of the Act, 21 U.S.C. section 360bbb-3(b)(1), unless the authorization is terminated or revoked.  Performed at [facility], [address], [place], [place] [address]     Today   Subjective    Carlee Matura today has no ***          Patient has been seen and examined prior to discharge   Objective   Blood pressure (!) 124/58, pulse (!) 56, temperature 98 F (36.7 C), resp. rate 17, height  (1.727 m), weight 118.8 kg, last menstrual period 05/17/2016, SpO2 97 %.   Intake/Output Summary (Last 24 hours) at 12/15/2022 1148 Last data filed at 12/15/2022 0900 Gross per 24 hour  Intake 720 ml  Output 800 ml  Net -80 ml    Exam Gen:- Awake Alert, no acute distress *** HEENT:- [place].AT, No sclera icterus Neck-Supple Neck,No JVD,.  Lungs-  CTAB , good air movement bilaterally CV- S1, S2 normal, regular  Abd-  +ve B.Sounds, Abd Soft, No tenderness,    Extremity/Skin:- No  edema,   good pulses Psych-affect is appropriate, oriented x3 Neuro-no new focal deficits, no tremors ***   Data Review   CBC w Diff:  Lab Results  Component Value Date   WBC 10.6 (H) 12/12/2022   HGB 11.1 (L) 12/12/2022   HGB 12.6 08/07/2018   HCT 35.6 (L) 12/12/2022   HCT 36.9 08/07/2018   PLT 359 12/12/2022   PLT 359 08/07/2018   LYMPHOPCT 9 12/12/2022   MONOPCT 3 12/12/2022   EOSPCT 1 12/12/2022   BASOPCT 1 12/12/2022    CMP:  Lab Results   Component Value Date   NA 138 12/12/2022   NA 141 09/26/2022   K 4.2 12/12/2022   CL 98 12/12/2022   CO2 31 12/12/2022   BUN 9 12/12/2022   BUN 12 09/26/2022   CREATININE 0.71 12/12/2022   CREATININE 0.85 08/26/2014   PROT 7.3 10/22/2022   PROT 6.6 09/26/2022   ALBUMIN 4.4 10/22/2022   ALBUMIN 4.3 09/26/2022   BILITOT 0.3 10/22/2022   BILITOT 0.3 09/26/2022   ALKPHOS 60 10/22/2022   AST 12 (L) 10/22/2022   ALT 13 10/22/2022  .  Total Discharge time is about 33 minutes  Shon Hale M.D on 12/15/2022 at 11:48 AM  Go to [email or web address] -  for contact info  [facility] - Office  [phone]

## 2022-12-15 NOTE — Discharge Instructions (Signed)
1)Take Prednisone 60 mg (6 tab) daily for 3 days, then 5 tab (50 mg) daily x 3 days , then Prednisone  (4 tabs) daily x 3 days, then  (3 tabs) x 3 days , then 20 mg (2 tabs) daily x 3 days, then  daily indefinitely until you see Dr. Dudley Major  2) please keep your appointment with Dr. Dudley Major as previously scheduled for Monday Dec 31, 2022  3) please note that there has been some changes to your medications including a slow prednisone taper as prescribed  4) incentive spirometry as advised  5)you need oxygen at home up to 3 L via nasal cannula with activity and with sleep, otherwise 1 to 2 L continuously while awake and resting --- smoking or having open fires around oxygen can cause fire, significant injury and death  6)Avoid ibuprofen/Advil/Aleve/Motrin/Goody Powders/Naproxen/BC powders/Meloxicam/Diclofenac/Indomethacin and other Nonsteroidal anti-inflammatory medications as these will make you more likely to bleed and can cause stomach ulcers, can also cause Kidney problems.

## 2022-12-17 ENCOUNTER — Other Ambulatory Visit (HOSPITAL_COMMUNITY): Payer: Self-pay

## 2022-12-17 MED ORDER — FORMOTEROL FUMARATE 20 MCG/2ML IN NEBU
INHALATION_SOLUTION | RESPIRATORY_TRACT | 3 refills | Status: DC
  Filled 2022-12-17: qty 360, 90d supply, fill #0

## 2022-12-17 MED ORDER — IPRATROPIUM-ALBUTEROL 0.5-2.5 (3) MG/3ML IN SOLN
3.0000 mL | Freq: Three times a day (TID) | RESPIRATORY_TRACT | 11 refills | Status: DC
  Filled 2022-12-17 – 2023-04-10 (×2): qty 270, 30d supply, fill #0

## 2022-12-18 ENCOUNTER — Other Ambulatory Visit: Payer: Self-pay

## 2022-12-18 ENCOUNTER — Encounter (HOSPITAL_COMMUNITY): Payer: Self-pay | Admitting: Emergency Medicine

## 2022-12-18 ENCOUNTER — Emergency Department (HOSPITAL_COMMUNITY)
Admission: EM | Admit: 2022-12-18 | Discharge: 2022-12-18 | Disposition: A | Discharge status: Home / Self Care | Payer: HMO | Attending: Emergency Medicine | Admitting: Emergency Medicine

## 2022-12-18 ENCOUNTER — Ambulatory Visit: Payer: HMO | Admitting: Family Medicine

## 2022-12-18 ENCOUNTER — Emergency Department (HOSPITAL_COMMUNITY): Payer: HMO

## 2022-12-18 DIAGNOSIS — J449 Chronic obstructive pulmonary disease, unspecified: Secondary | ICD-10-CM | POA: Insufficient documentation

## 2022-12-18 DIAGNOSIS — M25551 Pain in right hip: Secondary | ICD-10-CM | POA: Insufficient documentation

## 2022-12-18 DIAGNOSIS — W19XXXA Unspecified fall, initial encounter: Secondary | ICD-10-CM | POA: Diagnosis not present

## 2022-12-18 DIAGNOSIS — R0789 Other chest pain: Secondary | ICD-10-CM | POA: Diagnosis not present

## 2022-12-18 DIAGNOSIS — W108XXA Fall (on) (from) other stairs and steps, initial encounter: Secondary | ICD-10-CM | POA: Insufficient documentation

## 2022-12-18 DIAGNOSIS — S32009A Unspecified fracture of unspecified lumbar vertebra, initial encounter for closed fracture: Secondary | ICD-10-CM

## 2022-12-18 DIAGNOSIS — S32028A Other fracture of second lumbar vertebra, initial encounter for closed fracture: Secondary | ICD-10-CM | POA: Insufficient documentation

## 2022-12-18 DIAGNOSIS — E039 Hypothyroidism, unspecified: Secondary | ICD-10-CM | POA: Diagnosis not present

## 2022-12-18 DIAGNOSIS — J90 Pleural effusion, not elsewhere classified: Secondary | ICD-10-CM | POA: Diagnosis not present

## 2022-12-18 DIAGNOSIS — Z7951 Long term (current) use of inhaled steroids: Secondary | ICD-10-CM | POA: Insufficient documentation

## 2022-12-18 DIAGNOSIS — I700 Atherosclerosis of aorta: Secondary | ICD-10-CM | POA: Diagnosis not present

## 2022-12-18 DIAGNOSIS — M533 Sacrococcygeal disorders, not elsewhere classified: Secondary | ICD-10-CM | POA: Insufficient documentation

## 2022-12-18 DIAGNOSIS — R079 Chest pain, unspecified: Secondary | ICD-10-CM | POA: Diagnosis not present

## 2022-12-18 DIAGNOSIS — S32020A Wedge compression fracture of second lumbar vertebra, initial encounter for closed fracture: Secondary | ICD-10-CM | POA: Diagnosis not present

## 2022-12-18 DIAGNOSIS — M549 Dorsalgia, unspecified: Secondary | ICD-10-CM | POA: Diagnosis not present

## 2022-12-18 DIAGNOSIS — M545 Low back pain, unspecified: Secondary | ICD-10-CM | POA: Insufficient documentation

## 2022-12-18 DIAGNOSIS — R109 Unspecified abdominal pain: Secondary | ICD-10-CM | POA: Diagnosis not present

## 2022-12-18 DIAGNOSIS — R102 Pelvic and perineal pain: Secondary | ICD-10-CM | POA: Diagnosis not present

## 2022-12-18 DIAGNOSIS — S32029B Unspecified fracture of second lumbar vertebra, initial encounter for open fracture: Secondary | ICD-10-CM | POA: Diagnosis not present

## 2022-12-18 DIAGNOSIS — G8929 Other chronic pain: Secondary | ICD-10-CM | POA: Insufficient documentation

## 2022-12-18 DIAGNOSIS — Z043 Encounter for examination and observation following other accident: Secondary | ICD-10-CM | POA: Diagnosis not present

## 2022-12-18 DIAGNOSIS — T1490XA Injury, unspecified, initial encounter: Secondary | ICD-10-CM | POA: Diagnosis not present

## 2022-12-18 LAB — CBC
HCT: 34 % — ABNORMAL LOW (ref 36.0–46.0)
Hemoglobin: 10.9 g/dL — ABNORMAL LOW (ref 12.0–15.0)
MCH: 29.4 pg (ref 26.0–34.0)
MCHC: 32.1 g/dL (ref 30.0–36.0)
MCV: 91.6 fL (ref 80.0–100.0)
Platelets: 375 10*3/uL (ref 150–400)
RBC: 3.71 MIL/uL — ABNORMAL LOW (ref 3.87–5.11)
RDW: 14.9 % (ref 11.5–15.5)
WBC: 18.2 10*3/uL — ABNORMAL HIGH (ref 4.0–10.5)
nRBC: 0 % (ref 0.0–0.2)

## 2022-12-18 LAB — BASIC METABOLIC PANEL
Anion gap: 8 (ref 5–15)
BUN: 19 mg/dL (ref 6–20)
CO2: 31 mmol/L (ref 22–32)
Calcium: 8.3 mg/dL — ABNORMAL LOW (ref 8.9–10.3)
Chloride: 99 mmol/L (ref 98–111)
Creatinine, Ser: 0.69 mg/dL (ref 0.44–1.00)
GFR, Estimated: >60 mL/min (ref >60)
Glucose, Bld: 107 mg/dL — ABNORMAL HIGH (ref 70–99)
Potassium: 3.8 mmol/L (ref 3.5–5.1)
Sodium: 138 mmol/L (ref 135–145)

## 2022-12-18 MED ORDER — HYDROMORPHONE HCL 2 MG PO TABS: 2.0000 mg | ORAL_TABLET | Freq: Four times a day (QID) | ORAL | 0 refills | Status: DC | PRN

## 2022-12-18 MED ORDER — OXYCODONE HCL 5 MG PO TABS
10.0000 mg | ORAL_TABLET | Freq: Once | ORAL | Status: AC
  Administered 2022-12-18: 10 mg via ORAL
  Filled 2022-12-18: qty 2

## 2022-12-18 MED ORDER — HYDROMORPHONE HCL 1 MG/ML IJ SOLN
1.0000 mg | Freq: Once | INTRAMUSCULAR | Status: AC
  Administered 2022-12-18: 1 mg via INTRAVENOUS
  Filled 2022-12-18: qty 1

## 2022-12-18 MED ORDER — KETOROLAC TROMETHAMINE 30 MG/ML IJ SOLN
30.0000 mg | Freq: Once | INTRAMUSCULAR | Status: AC
  Administered 2022-12-18: 30 mg via INTRAVENOUS
  Filled 2022-12-18: qty 1

## 2022-12-18 NOTE — ED Notes (Signed)
Patient transported to X-ray 

## 2022-12-18 NOTE — ED Notes (Signed)
Patient transported to CT 

## 2022-12-18 NOTE — ED Provider Notes (Signed)
[place] EMERGENCY DEPARTMENT AT [facility] Provider Note   CSN: [id number] Arrival date & time: 12/18/22  1531     History  Chief Complaint  Patient presents with   Kathleen [name] is a 62 y.o. female with history of adrenal insufficiency, anxiety, avascular necrosis of the hips, chronic pain on chronic opioids, COPD, fibromyalgia, GERD, hypothyroidism, mitral valve prolapse, peripheral neuropathy, sarcoidosis who presents to the ER after a fall. Patient states that she had tripped through a doorway over 2 steps, landing on her right side on the asphalt. She does not believe she struck her head, no LOC. Refused c-collar on scene. Patient is on 3 L O2 chronically at home. Husband states she hasn't totally been acting like herself since she got out of the hospital three days ago.    Fall       Home Medications Prior to Admission medications   Medication Sig Start Date End Date Taking? Authorizing Provider  acetaminophen (TYLENOL) 500 MG tablet Take 1,000 mg by mouth every 8 (eight) hours as needed for headache.   Yes [provider]  albuterol (PROVENTIL) (2.5 MG/3ML) 0.083% nebulizer solution Take 3 mLs (2.5 mg total) by nebulization every 4 (four) hours as needed for wheezing or shortness of breath. 12/15/22 12/15/23 Yes Emokpae, Courage, MD  albuterol (VENTOLIN HFA) 108 (90 Base) MCG/ACT inhaler Inhale 2 puffs into the lungs every 6 (six) hours as needed for wheezing or shortness of breath. 12/15/22  Yes Shon Hale, MD  ALPRAZolam Prudy Feeler) 0.5 MG tablet Take 1 tablet (0.5 mg total) by mouth 2 (two) times daily as needed for anxiety. 12/15/22 12/15/23 Yes Emokpae, Courage, MD  ascorbic acid (VITAMIN C) 500 MG tablet Take 1 tablet (500 mg total) by mouth daily. 05/23/21  Yes Emokpae, Courage, MD  budesonide (PULMICORT) 0.5 MG/2ML nebulizer solution Inhale the contents of 1 vial via nebulizer two (2) times a day. 11/22/21  Yes   doxycycline (VIBRA-TABS) 100  MG tablet Take 1 tablet (100 mg total) by mouth 2 (two) times daily for 5 days. 12/15/22 12/20/22 Yes Emokpae, Courage, MD  EPINEPHrine 0.3 mg/0.3 mL IJ SOAJ injection Inject 0.3 mg into the muscle as needed for anaphylaxis. 01/05/22  Yes Luking, Jonna Coup, MD  escitalopram (LEXAPRO) 10 MG tablet Take 1 tablet (10 mg total) by mouth daily. 07/30/22  Yes Luking, Jonna Coup, MD  formoterol (PERFOROMIST) 20 MCG/2ML nebulizer solution Inhale the contents of 1 vial via nebulizer two (2) times a day. 12/15/22  Yes Emokpae, Courage, MD  hydrocortisone (CORTEF) 10 MG tablet Take 2 tablets by mouth at 8:00 am and 1 tablet at lunch. (May double dose on sick days x 3-5 days) 10/24/22  Yes Nida, Denman George, MD  hydrOXYzine (ATARAX) 25 MG tablet Take 1 tablet (25 mg total) by mouth 3 (three) times daily as needed for anxiety. 12/15/22 12/15/23 Yes Emokpae, Courage, MD  ipratropium-albuterol (DUONEB) 0.5-2.5 (3) MG/3ML SOLN Inhale 1 vial via nebulizer every 8 hours. 12/17/22  Yes   levothyroxine (SYNTHROID) 50 MCG tablet Take 1 tablet by mouth daily before breakfast. 10/24/22 04/22/23 Yes Nida, Denman George, MD  ondansetron (ZOFRAN-ODT) 4 MG disintegrating tablet Take 1 tablet (4 mg total) by mouth every 8 (eight) hours as needed for nausea or vomiting. 03/02/22  Yes Dykstra, Quitman Livings, MD  Oxycodone HCl 10 MG TABS Take 1 tablet (10 mg total) by mouth every 4 (four) hours as needed for pain. Max of 6 tablets daily.  09/10/22  Yes [name], MD  pantoprazole (PROTONIX) 40 MG tablet Take 1 tablet (40 mg total) by mouth 2 (two) times daily before a meal. 12/15/22 12/15/23 Yes Emokpae, Courage, MD  predniSONE (DELTASONE) 10 MG tablet Take 1 tablet (10 mg total) by mouth See admin instructions. Take Prednisone 60 mg (6 tab) daily for 3 days, then 5 tab (50 mg) daily x 3 days , then Prednisone  (4 tabs) daily x 3 days, then  (3 tabs) x 3 days , then 20 mg (2 tabs) daily x 3 days, then  daily indefinitely until you see  Dr. Dudley Major 12/15/22  Yes Emokpae, Courage, MD  rosuvastatin (CRESTOR) 10 MG tablet Take 1 tablet (10 mg total) by mouth daily. 07/03/22  Yes Luking, Jonna Coup, MD  ferrous sulfate 325 (65 FE) MG EC tablet Take 325 mg by mouth as needed. Patient not taking: Reported on 12/12/2022    [provider]  formoterol (PERFOROMIST) 20 MCG/2ML nebulizer solution Inhale 1 vial via nebulizer two (2) times a day. Patient not taking: Reported on 12/18/2022 12/17/22     formoterol (PERFOROMIST) 20 MCG/2ML nebulizer solution Inhale into the lungs. Patient not taking: Reported on 12/18/2022 12/17/22 12/17/23  [provider]  linaclotide Karlene Einstein) 290 MCG CAPS capsule Take 1 capsule (290 mcg total) by mouth daily before breakfast. Patient not taking: Reported on 12/12/2022 03/28/22   [name], MD  predniSONE (DELTASONE) 5 MG tablet Take 1 tablet (5 mg total) by mouth in the morning. Patient taking differently: Take 10 mg by mouth in the morning. 07/04/22         Allergies    Bee venom, Contrast media [iodinated contrast media], Lactose intolerance (gi), Bactrim [sulfamethoxazole-trimethoprim], Codeine, and Morphine and related    Review of Systems   Review of Systems  Musculoskeletal:  Positive for arthralgias and back pain.  All other systems reviewed and are negative.   Physical Exam Updated Vital Signs BP 123/61 (BP Location: Right Arm)   Pulse 67   Temp 97.7 F (36.5 C) (Oral)   Resp 19   Ht  (1.727 m)   Wt 118.8 kg   LMP 05/17/2016   SpO2 99%   BMI 39.82 kg/m  Physical Exam Vitals and nursing note reviewed.  Constitutional:      Appearance: Normal appearance. She is obese.     Comments: Patient moaning in pain  HENT:     Head: Normocephalic and atraumatic.  Eyes:     Conjunctiva/sclera: Conjunctivae normal.  Cardiovascular:     Rate and Rhythm: Normal rate and regular rhythm.  Pulmonary:     Effort: Pulmonary effort is normal. No respiratory distress.     Breath  sounds: Normal breath sounds.  Chest:     Comments: Significant tenderness to palpation of right anterior chest wall just beneath the breast, no overlying skin changes Abdominal:     General: There is no distension.     Palpations: Abdomen is soft.     Tenderness: There is no abdominal tenderness.  Musculoskeletal:     Comments: Pelvis stable, with generalized tenderness to palpation around the right hip  Midline spinal tenderness at the lumbosacrum, without step offs or crepitus  Skin:    General: Skin is warm and dry.  Neurological:     General: No focal deficit present.     Mental Status: She is alert.     ED Results / Procedures / Treatments   Labs (all labs ordered  are listed, but only abnormal results are displayed) Labs Reviewed  CBC  BASIC METABOLIC PANEL    EKG None  Radiology CT CHEST ABDOMEN PELVIS WO CONTRAST  Result Date: 12/18/2022 CLINICAL DATA:  [name] down 2 steps, right flank and thoracic pain EXAM: CT CHEST, ABDOMEN AND PELVIS WITHOUT CONTRAST TECHNIQUE: Multidetector CT imaging of the chest, abdomen and pelvis was performed following the standard protocol without IV contrast. Examination was performed without contrast at the request of the ordering clinician. Evaluation of the solid viscera, vascular structures, and soft tissues are limited, especially in the setting of trauma. RADIATION DOSE REDUCTION: This exam was performed according to the departmental dose-optimization program which includes automated exposure control, adjustment of the mA and/or kV according to patient size and/or use of iterative reconstruction technique. COMPARISON:  10/22/2022 FINDINGS: CT CHEST FINDINGS Cardiovascular: Unenhanced imaging of the heart is unremarkable, without pericardial effusion. Normal caliber of the thoracic aorta. Evaluation of the vascular structures is limited without intravenous contrast. Mediastinum/Nodes: No enlarged mediastinal, hilar, or axillary lymph nodes.  Thyroid gland, trachea, and esophagus demonstrate no significant findings. Lungs/Pleura: No acute airspace disease, effusion, or pneumothorax. Stable areas of scarring or chronic atelectasis within the bilateral upper lobes and dependent left lower lobe. Central airways are patent. Musculoskeletal: No acute displaced fracture. Reconstructed images demonstrate no additional findings. CT ABDOMEN PELVIS FINDINGS Hepatobiliary: Unremarkable unenhanced appearance of the liver. Prior cholecystectomy. No biliary duct dilation. Pancreas: Fatty atrophy of the pancreatic head and body, stable. No acute inflammatory changes. Spleen: Unremarkable unenhanced appearance. Adrenals/Urinary Tract: No urinary tract calculi or obstructive uropathy. The bladder is unremarkable. The adrenals are normal. Stomach/Bowel: No bowel obstruction or ileus. No bowel wall thickening or inflammatory change. Vascular/Lymphatic: Atherosclerosis of the abdominal aorta. No pathologic adenopathy. Reproductive: Uterus and bilateral adnexa are unremarkable. Other: No free fluid or free intraperitoneal gas. No abdominal wall hernia. Musculoskeletal: No acute or destructive bony lesions. Moderate left hip osteoarthritis. Reconstructed images demonstrate no additional findings. IMPRESSION: 1. No acute intrathoracic, intra-abdominal, or intrapelvic trauma on this limited unenhanced exam. 2.  Aortic Atherosclerosis (ICD10-I70.0). Electronically Signed   By: Sharlet Salina M.D.   On: 12/18/2022 19:24   CT Head Wo Contrast  Result Date: 12/18/2022 CLINICAL DATA:  Fall down stairs EXAM: CT HEAD WITHOUT CONTRAST CT CERVICAL SPINE WITHOUT CONTRAST TECHNIQUE: Multidetector CT imaging of the head and cervical spine was performed following the standard protocol without intravenous contrast. Multiplanar CT image reconstructions of the cervical spine were also generated. RADIATION DOSE REDUCTION: This exam was performed according to the departmental  dose-optimization program which includes automated exposure control, adjustment of the mA and/or kV according to patient size and/or use of iterative reconstruction technique. COMPARISON:  None Available. FINDINGS: CT HEAD FINDINGS Brain: No evidence of acute infarct, hemorrhage, mass, mass effect, or midline shift. No hydrocephalus or extra-axial fluid collection. Vascular: No hyperdense vessel. Skull: Negative for fracture or focal lesion. Sinuses/Orbits: No acute finding. Other: Fluid in the right mastoid air cells. Calcified scalp lesions. CT CERVICAL SPINE FINDINGS Alignment: No traumatic listhesis. Straightening of the normal cervical lordosis, which may be positional. Skull base and vertebrae: No acute fracture. No primary bone lesion or focal pathologic process. Soft tissues and spinal canal: No prevertebral fluid or swelling. No visible canal hematoma. Disc levels: Degenerative changes in the cervical spine. No significant spinal canal stenosis. Upper chest: Moderate pleural effusions. IMPRESSION: 1. No acute intracranial process. 2. No acute fracture or traumatic listhesis in the cervical spine. 3. Moderate  pleural effusions. Electronically Signed   By: Wiliam Ke M.D.   On: 12/18/2022 18:02   CT Cervical Spine Wo Contrast  Result Date: 12/18/2022 CLINICAL DATA:  Fall down stairs EXAM: CT HEAD WITHOUT CONTRAST CT CERVICAL SPINE WITHOUT CONTRAST TECHNIQUE: Multidetector CT imaging of the head and cervical spine was performed following the standard protocol without intravenous contrast. Multiplanar CT image reconstructions of the cervical spine were also generated. RADIATION DOSE REDUCTION: This exam was performed according to the departmental dose-optimization program which includes automated exposure control, adjustment of the mA and/or kV according to patient size and/or use of iterative reconstruction technique. COMPARISON:  None Available. FINDINGS: CT HEAD FINDINGS Brain: No evidence of acute  infarct, hemorrhage, mass, mass effect, or midline shift. No hydrocephalus or extra-axial fluid collection. Vascular: No hyperdense vessel. Skull: Negative for fracture or focal lesion. Sinuses/Orbits: No acute finding. Other: Fluid in the right mastoid air cells. Calcified scalp lesions. CT CERVICAL SPINE FINDINGS Alignment: No traumatic listhesis. Straightening of the normal cervical lordosis, which may be positional. Skull base and vertebrae: No acute fracture. No primary bone lesion or focal pathologic process. Soft tissues and spinal canal: No prevertebral fluid or swelling. No visible canal hematoma. Disc levels: Degenerative changes in the cervical spine. No significant spinal canal stenosis. Upper chest: Moderate pleural effusions. IMPRESSION: 1. No acute intracranial process. 2. No acute fracture or traumatic listhesis in the cervical spine. 3. Moderate pleural effusions. Electronically Signed   By: Wiliam Ke M.D.   On: 12/18/2022 18:02   DG Chest 1 View  Result Date: 12/18/2022 CLINICAL DATA:  Pain after a fall. EXAM: CHEST  1 VIEW COMPARISON:  12/12/2022 FINDINGS: Shallow inspiration. Prominent cardiac enlargement with vascular congestion and perihilar infiltrates, likely edema. Small pleural effusions are suggested. No pneumothorax. IMPRESSION: Cardiac enlargement with vascular congestion, perihilar edema, and small effusions. Changes are progressing since previous study. Electronically Signed   By: Burman Nieves M.D.   On: 12/18/2022 17:47   DG Pelvis 1-2 Views  Result Date: 12/18/2022 CLINICAL DATA:  Patient fell down 2 steps. Right flank and thoracic pain. EXAM: PELVIS - 1-2 VIEW COMPARISON:  03/13/2022 FINDINGS: Mild degenerative changes in the lower lumbar spine and hips. No acute fracture or dislocation. No focal bone lesions. SI joints and symphysis pubis are not displaced. IMPRESSION: No acute bony abnormalities. Degenerative changes in the spine and hips. Electronically Signed    By: Burman Nieves M.D.   On: 12/18/2022 17:46    Procedures Procedures    Medications Ordered in ED Medications  oxyCODONE (Oxy IR/ROXICODONE) immediate release tablet 10 mg (10 mg Oral Given 12/18/22 1653)  HYDROmorphone (DILAUDID) injection 1 mg (1 mg Intravenous Given 12/18/22 1846)    ED Course/ Medical Decision Making/ A&P                             Medical Decision Making Amount and/or Complexity of Data Reviewed Labs: ordered. Radiology: ordered.  Risk Prescription drug management.   This patient is a 62 y.o. female  who presents to the ED for concern of mechanical fall.   Past Medical History / Co-morbidities: adrenal insufficiency, anxiety, avascular necrosis of the hips, chronic pain on chronic opioids, COPD, fibromyalgia, GERD, hypothyroidism, mitral valve prolapse, peripheral neuropathy, sarcoidosis  Additional history: Chart reviewed. Pertinent results include: Patient just discharged from the hospital on 4/20 after COPD exacerbation  Physical Exam: Physical exam performed. The pertinent findings include: Normal vital  signs, moaning in pain. Significant tenderness over right anterior chest wall, no overlying skin changes. Pelvis stable. Midline lumbosacral spinal tenderness without step offs.   Lab Tests/Imaging studies: I personally interpreted labs/imaging and the pertinent results include:  initially ordered CT head and cervical spine, and x-rays of chest and pelvis all without acute findings. I agree with the radiologist interpretation.  On reevaluation, patient still in a significant amount of pain with persistent right rib pain and lower back pain, will add on CT chest/abd/pelvis wo and no charge spinal imaging. Will obtain labs at this time too.   Medications: I ordered medication including home pain meds, IV dilaudid.  I have reviewed the patients home medicines and have made adjustments as needed.   Disposition: Patient discussed and care  transferred to [facility] at shift change. Please see his/her note for further details regarding further ED course and disposition. Plan at time of handoff is follow up on labs and imaging. Anticipate dc to home with musculoskeletal pain but with significant pain despite home pain medication, concern for fracture not seen on x-ray imaging.    Final Clinical Impression(s) / ED Diagnoses Final diagnoses:  Fall, initial encounter  Right-sided chest wall pain  Acute right-sided low back pain without sciatica    Rx / DC Orders ED Discharge Orders     None      Portions of this report may have been transcribed using voice recognition software. Every effort was made to ensure accuracy; however, inadvertent computerized transcription errors may be present.    Jeanella Flattery 12/18/22 Caesar Chestnut, MD 12/19/22 1245

## 2022-12-18 NOTE — ED Triage Notes (Signed)
Pt fell down 2 steps and landed on asphalt. Pt c/o right flank/thoracic pain. Pt refused c-collar. Pt is on 3lpm continuously at home.

## 2022-12-18 NOTE — Discharge Instructions (Signed)
Your workup this evening shows that you have a fracture of the transverse process you may find that a LSO brace to support your back may help.  You can get one of these at [facility].  These injuries take several weeks to heal.  Please call your primary care provider to arrange follow-up appointment.

## 2022-12-18 NOTE — ED Provider Notes (Signed)
Patient signed out to me by Kathleen August, PA-C pending CT results  Patient here after mechanical fall landing on her right side.  She complains of pain along her right lateral chest wall, hip, and buttock states she fell down 2 steps landing on asphalt.  See previous provider note for complete H&P  On my exam, patient describing pain of her hip and lower back area.  She takes 10 mg oxycodone daily for chronic pain.  Has required multiple doses of pain medication here  CT imaging of the L-spine shows fracture of the L2 transverse process   Discussed findings with neurosurgery, Alli Cosentino, PA-C Felt that LSO brace may be of some benefit.  Outpatient follow-up recommended   CT T-SPINE NO CHARGE  Result Date: 12/18/2022 CLINICAL DATA:  Trauma EXAM: CT THORACIC, AND LUMBAR SPINE WITHOUT CONTRAST TECHNIQUE: Multidetector CT imaging of the thoracic and lumbar spine was performed without intravenous contrast. Multiplanar CT image reconstructions were also generated. RADIATION DOSE REDUCTION: This exam was performed according to the departmental dose-optimization program which includes automated exposure control, adjustment of the mA and/or kV according to patient size and/or use of iterative reconstruction technique. COMPARISON:  None Available. FINDINGS: CT THORACIC SPINE FINDINGS Alignment: Normal. Vertebrae: No acute fracture or focal pathologic process. Paraspinal and other soft tissues: See separately dictated CT chest abdomen and pelvis for additional findings, including findings related to consolidative opacities in the dependent portions of the bilateral lungs. Disc levels: No evidence of high-grade spinal canal stenosis. CT LUMBAR SPINE FINDINGS Segmentation: 5 lumbar type vertebrae. Alignment: Normal. Vertebrae: There is a mildly displaced fractures of the right transverse process at L2 (series 2, image 59). Paraspinal and other soft tissues: See separately dictated CT chest abdomen  pelvis for intra-abdominal findings. Disc levels: No evidence of high-grade spinal canal stenosis. IMPRESSION: 1. Mildly displaced fracture of the right transverse process at L2. 2. No acute fracture in the thoracic spine. Electronically Signed   By: Lorenza Cambridge M.D.   On: 12/18/2022 19:26   CT L-SPINE NO CHARGE  Result Date: 12/18/2022 CLINICAL DATA:  Trauma EXAM: CT THORACIC, AND LUMBAR SPINE WITHOUT CONTRAST TECHNIQUE: Multidetector CT imaging of the thoracic and lumbar spine was performed without intravenous contrast. Multiplanar CT image reconstructions were also generated. RADIATION DOSE REDUCTION: This exam was performed according to the departmental dose-optimization program which includes automated exposure control, adjustment of the mA and/or kV according to patient size and/or use of iterative reconstruction technique. COMPARISON:  None Available. FINDINGS: CT THORACIC SPINE FINDINGS Alignment: Normal. Vertebrae: No acute fracture or focal pathologic process. Paraspinal and other soft tissues: See separately dictated CT chest abdomen and pelvis for additional findings, including findings related to consolidative opacities in the dependent portions of the bilateral lungs. Disc levels: No evidence of high-grade spinal canal stenosis. CT LUMBAR SPINE FINDINGS Segmentation: 5 lumbar type vertebrae. Alignment: Normal. Vertebrae: There is a mildly displaced fractures of the right transverse process at L2 (series 2, image 59). Paraspinal and other soft tissues: See separately dictated CT chest abdomen pelvis for intra-abdominal findings. Disc levels: No evidence of high-grade spinal canal stenosis. IMPRESSION: 1. Mildly displaced fracture of the right transverse process at L2. 2. No acute fracture in the thoracic spine. Electronically Signed   By: Lorenza Cambridge M.D.   On: 12/18/2022 19:26   CT CHEST ABDOMEN PELVIS WO CONTRAST  Result Date: 12/18/2022 CLINICAL DATA:  [name] down 2 steps, right flank and  thoracic pain EXAM: CT CHEST, ABDOMEN AND  PELVIS WITHOUT CONTRAST TECHNIQUE: Multidetector CT imaging of the chest, abdomen and pelvis was performed following the standard protocol without IV contrast. Examination was performed without contrast at the request of the ordering clinician. Evaluation of the solid viscera, vascular structures, and soft tissues are limited, especially in the setting of trauma. RADIATION DOSE REDUCTION: This exam was performed according to the departmental dose-optimization program which includes automated exposure control, adjustment of the mA and/or kV according to patient size and/or use of iterative reconstruction technique. COMPARISON:  10/22/2022 FINDINGS: CT CHEST FINDINGS Cardiovascular: Unenhanced imaging of the heart is unremarkable, without pericardial effusion. Normal caliber of the thoracic aorta. Evaluation of the vascular structures is limited without intravenous contrast. Mediastinum/Nodes: No enlarged mediastinal, hilar, or axillary lymph nodes. Thyroid gland, trachea, and esophagus demonstrate no significant findings. Lungs/Pleura: No acute airspace disease, effusion, or pneumothorax. Stable areas of scarring or chronic atelectasis within the bilateral upper lobes and dependent left lower lobe. Central airways are patent. Musculoskeletal: No acute displaced fracture. Reconstructed images demonstrate no additional findings. CT ABDOMEN PELVIS FINDINGS Hepatobiliary: Unremarkable unenhanced appearance of the liver. Prior cholecystectomy. No biliary duct dilation. Pancreas: Fatty atrophy of the pancreatic head and body, stable. No acute inflammatory changes. Spleen: Unremarkable unenhanced appearance. Adrenals/Urinary Tract: No urinary tract calculi or obstructive uropathy. The bladder is unremarkable. The adrenals are normal. Stomach/Bowel: No bowel obstruction or ileus. No bowel wall thickening or inflammatory change. Vascular/Lymphatic: Atherosclerosis of the abdominal  aorta. No pathologic adenopathy. Reproductive: Uterus and bilateral adnexa are unremarkable. Other: No free fluid or free intraperitoneal gas. No abdominal wall hernia. Musculoskeletal: No acute or destructive bony lesions. Moderate left hip osteoarthritis. Reconstructed images demonstrate no additional findings. IMPRESSION: 1. No acute intrathoracic, intra-abdominal, or intrapelvic trauma on this limited unenhanced exam. 2.  Aortic Atherosclerosis (ICD10-I70.0). Electronically Signed   By: Sharlet Salina M.D.   On: 12/18/2022 19:24   CT Head Wo Contrast  Result Date: 12/18/2022 CLINICAL DATA:  Fall down stairs EXAM: CT HEAD WITHOUT CONTRAST CT CERVICAL SPINE WITHOUT CONTRAST TECHNIQUE: Multidetector CT imaging of the head and cervical spine was performed following the standard protocol without intravenous contrast. Multiplanar CT image reconstructions of the cervical spine were also generated. RADIATION DOSE REDUCTION: This exam was performed according to the departmental dose-optimization program which includes automated exposure control, adjustment of the mA and/or kV according to patient size and/or use of iterative reconstruction technique. COMPARISON:  None Available. FINDINGS: CT HEAD FINDINGS Brain: No evidence of acute infarct, hemorrhage, mass, mass effect, or midline shift. No hydrocephalus or extra-axial fluid collection. Vascular: No hyperdense vessel. Skull: Negative for fracture or focal lesion. Sinuses/Orbits: No acute finding. Other: Fluid in the right mastoid air cells. Calcified scalp lesions. CT CERVICAL SPINE FINDINGS Alignment: No traumatic listhesis. Straightening of the normal cervical lordosis, which may be positional. Skull base and vertebrae: No acute fracture. No primary bone lesion or focal pathologic process. Soft tissues and spinal canal: No prevertebral fluid or swelling. No visible canal hematoma. Disc levels: Degenerative changes in the cervical spine. No significant spinal  canal stenosis. Upper chest: Moderate pleural effusions. IMPRESSION: 1. No acute intracranial process. 2. No acute fracture or traumatic listhesis in the cervical spine. 3. Moderate pleural effusions. Electronically Signed   By: Wiliam Ke M.D.   On: 12/18/2022 18:02   CT Cervical Spine Wo Contrast  Result Date: 12/18/2022 CLINICAL DATA:  Fall down stairs EXAM: CT HEAD WITHOUT CONTRAST CT CERVICAL SPINE WITHOUT CONTRAST TECHNIQUE: Multidetector CT imaging of the head  and cervical spine was performed following the standard protocol without intravenous contrast. Multiplanar CT image reconstructions of the cervical spine were also generated. RADIATION DOSE REDUCTION: This exam was performed according to the departmental dose-optimization program which includes automated exposure control, adjustment of the mA and/or kV according to patient size and/or use of iterative reconstruction technique. COMPARISON:  None Available. FINDINGS: CT HEAD FINDINGS Brain: No evidence of acute infarct, hemorrhage, mass, mass effect, or midline shift. No hydrocephalus or extra-axial fluid collection. Vascular: No hyperdense vessel. Skull: Negative for fracture or focal lesion. Sinuses/Orbits: No acute finding. Other: Fluid in the right mastoid air cells. Calcified scalp lesions. CT CERVICAL SPINE FINDINGS Alignment: No traumatic listhesis. Straightening of the normal cervical lordosis, which may be positional. Skull base and vertebrae: No acute fracture. No primary bone lesion or focal pathologic process. Soft tissues and spinal canal: No prevertebral fluid or swelling. No visible canal hematoma. Disc levels: Degenerative changes in the cervical spine. No significant spinal canal stenosis. Upper chest: Moderate pleural effusions. IMPRESSION: 1. No acute intracranial process. 2. No acute fracture or traumatic listhesis in the cervical spine. 3. Moderate pleural effusions. Electronically Signed   By: Wiliam Ke M.D.   On:  12/18/2022 18:02   DG Chest 1 View  Result Date: 12/18/2022 CLINICAL DATA:  Pain after a fall. EXAM: CHEST  1 VIEW COMPARISON:  12/12/2022 FINDINGS: Shallow inspiration. Prominent cardiac enlargement with vascular congestion and perihilar infiltrates, likely edema. Small pleural effusions are suggested. No pneumothorax. IMPRESSION: Cardiac enlargement with vascular congestion, perihilar edema, and small effusions. Changes are progressing since previous study. Electronically Signed   By: Burman Nieves M.D.   On: 12/18/2022 17:47   DG Pelvis 1-2 Views  Result Date: 12/18/2022 CLINICAL DATA:  Patient fell down 2 steps. Right flank and thoracic pain. EXAM: PELVIS - 1-2 VIEW COMPARISON:  03/13/2022 FINDINGS: Mild degenerative changes in the lower lumbar spine and hips. No acute fracture or dislocation. No focal bone lesions. SI joints and symphysis pubis are not displaced. IMPRESSION: No acute bony abnormalities. Degenerative changes in the spine and hips. Electronically Signed   By: Burman Nieves M.D.   On: 12/18/2022 17:46   DG Chest Port 1 View  Result Date: 12/12/2022 CLINICAL DATA:  Shortness of breath. EXAM: PORTABLE CHEST 1 VIEW COMPARISON:  Chest radiograph dated 10/22/2022. FINDINGS: Background of emphysema and left upper lobe scarring. There are bibasilar densities which may represent atelectasis. Pneumonia is less likely but not excluded. No large pleural effusion. No pneumothorax. Stable cardiomediastinal silhouette. No acute osseous pathology. IMPRESSION: Bibasilar atelectasis versus less likely infiltrate. Electronically Signed   By: Elgie Collard M.D.   On: 12/12/2022 18:05      Larcenia, Holaday, PA-C 12/18/22 2331    Bethann Berkshire, MD 12/19/22 1245

## 2022-12-19 ENCOUNTER — Encounter: Payer: Self-pay | Admitting: Family Medicine

## 2022-12-20 ENCOUNTER — Telehealth: Payer: HMO | Admitting: Family Medicine

## 2022-12-20 ENCOUNTER — Telehealth: Payer: Self-pay

## 2022-12-20 NOTE — Telephone Encounter (Signed)
Patient has been scheduled for 4:30pm tomorrow.

## 2022-12-20 NOTE — Telephone Encounter (Signed)
I would recommend a virtual visit tomorrow-video highly preferred-not just a phone call-please work into my schedule for tomorrow to address her pain and other potential choices Because Dilaudid is causing her abdominal pain on recommend she stick with oxycodone for now 1 every 3-4 hours as long as it is not causing drowsiness thank you

## 2022-12-20 NOTE — Telephone Encounter (Signed)
Pt fell out the door when she was coming to see Dr Lorin Picket the other day and she had to be checked she finished HYDROmorphone (DILAUDID) 2 MG tablet she said that this is making her stomach hurt. She is in a great deal of pain from the fall and wants to see if Dr Lorin Picket can put her on a different pain med. She chip a bone on her vertebrae. She can't move around.   [facility] on [address] [phone] call when sent

## 2022-12-20 NOTE — Telephone Encounter (Signed)
So noted 

## 2022-12-21 ENCOUNTER — Telehealth: Payer: Self-pay

## 2022-12-21 ENCOUNTER — Telehealth (INDEPENDENT_AMBULATORY_CARE_PROVIDER_SITE_OTHER): Payer: HMO | Admitting: Family Medicine

## 2022-12-21 DIAGNOSIS — S32009D Unspecified fracture of unspecified lumbar vertebra, subsequent encounter for fracture with routine healing: Secondary | ICD-10-CM

## 2022-12-21 DIAGNOSIS — S32009S Unspecified fracture of unspecified lumbar vertebra, sequela: Secondary | ICD-10-CM

## 2022-12-21 MED ORDER — OXYCODONE HCL ER 30 MG PO T12A: EXTENDED_RELEASE_TABLET | ORAL | 0 refills | Status: DC

## 2022-12-21 MED ORDER — NALOXONE HCL 4 MG/0.1ML NA LIQD
NASAL | 4 refills | Status: DC
Start: 2022-12-21 — End: 2022-12-26

## 2022-12-21 NOTE — Progress Notes (Signed)
Subjective:    Patient ID: Kathleen [name], female    DOB: 1961/01/27, 62 y.o.   MRN: [id number]  HPI Virtual Visit via Video Note  I connected with Kathleen [name] on 12/21/22 at  4:30 PM EDT by a video enabled telemedicine application and verified that I am speaking with the correct person using two identifiers.  Location: Patient: Home Provider: Office   I discussed the limitations of evaluation and management by telemedicine and the availability of in person appointments. The patient expressed understanding and agreed to proceed.  History of Present Illness:    Observations/Objective:   Assessment and Plan:   Follow Up Instructions:    I discussed the assessment and treatment plan with the patient. The patient was provided an opportunity to ask questions and all were answered. The patient agreed with the plan and demonstrated an understanding of the instructions.   The patient was advised to call back or seek an in-person evaluation if the symptoms worsen or if the condition fails to improve as anticipated.  I provided 30 minutes of non-face-to-face time during this encounter.   Kathleen Punt, MD  Patient states she fell and is unable to walk due to a  broken vertebrae. Patient received shots in the hospital to keep from getting blood clot and patient is concerned does she need to worry about that at  home?  Very nice patient Unfortunately she took a fall and it caused a facet fracture in her back In addition to this she already has chronic pain from avascular necrosis of the hip Already on oxycodone 10 mg she takes 6 of those per day She is having severe pain that she states oxycodone is not helping She is requesting something stronger Emergency department prescribed her Xanax I told her this would be a dangerous combination recommend that she not take the Xanax and she discard these In regards to the pain we did discuss how we can go to a long-acting pain  medicine She did not tolerate Dilaudid Review of Systems     Objective:   Physical Exam General-in no acute distress Eyes-no discharge Lungs-respiratory rate normal, CTA CV-no murmurs,RRR Extremities skin warm dry no edema Neuro grossly normal Behavior normal, alert  Currently patient is housebound and also to some degree bed and chair bound because of her back pain she is unable to find a back brace that will fit her body  Patient was instructed that she has inability to get OxyContin filled because of her insurance she may take 1 or 2 extra oxycodones in the course of the day but do not exceed 80 mg in the course of the day and only utilize the medicine if the pain is severe not causing drowsiness    Assessment & Plan:  Long-acting oxycodone makes the most sense I told her physical therapy would also be beneficial In addition to this consultation with the orthopedic specialist at the back We will try to help coordinate this As for the pain medicine long-acting oxycodone 30 mg twice daily along with short acting oxycodone for breakthrough pain as long as it does not cause excessive drowsiness Also discussed with patient and her husband via video use of Narcan as a protective measure if somnolence occurs Also reinforced do not take Xanax In regards to all of this patient voices understanding If her insurance does not cover OxyContin then we will need to try a different long-acting pain medicine We will do a in person follow-up as  soon as she feels she can come to the office  Referral to orthopedics for her back Referral for home physical therapy Patient was encouraged to do office visit as soon as her back allows it

## 2022-12-21 NOTE — Telephone Encounter (Signed)
Called and spoke with patient per to inform her that Dr Lorin Picket is running behind and may not be able to speak to her earlier than her 4:30 pm video visit.  Patient requested that the nurse or CMA give her a call and notify if Dr Lorin Picket would or would not be able to see her earlier than the 4:30 pm video call. Patient verbalized understanding.

## 2022-12-22 ENCOUNTER — Other Ambulatory Visit (HOSPITAL_COMMUNITY): Payer: Self-pay

## 2022-12-22 ENCOUNTER — Other Ambulatory Visit: Payer: Self-pay | Admitting: Family Medicine

## 2022-12-24 ENCOUNTER — Telehealth (INDEPENDENT_AMBULATORY_CARE_PROVIDER_SITE_OTHER): Payer: HMO | Admitting: Family Medicine

## 2022-12-24 ENCOUNTER — Other Ambulatory Visit: Payer: Self-pay | Admitting: Family Medicine

## 2022-12-24 ENCOUNTER — Other Ambulatory Visit (HOSPITAL_COMMUNITY): Payer: Self-pay

## 2022-12-24 ENCOUNTER — Other Ambulatory Visit: Payer: Self-pay

## 2022-12-24 DIAGNOSIS — S32009D Unspecified fracture of unspecified lumbar vertebra, subsequent encounter for fracture with routine healing: Secondary | ICD-10-CM

## 2022-12-24 DIAGNOSIS — J849 Interstitial pulmonary disease, unspecified: Secondary | ICD-10-CM | POA: Diagnosis not present

## 2022-12-24 DIAGNOSIS — Z79891 Long term (current) use of opiate analgesic: Secondary | ICD-10-CM | POA: Diagnosis not present

## 2022-12-24 DIAGNOSIS — S32009S Unspecified fracture of unspecified lumbar vertebra, sequela: Secondary | ICD-10-CM

## 2022-12-24 DIAGNOSIS — E271 Primary adrenocortical insufficiency: Secondary | ICD-10-CM | POA: Diagnosis not present

## 2022-12-24 NOTE — Progress Notes (Unsigned)
Subjective:    Patient ID: Kathleen [name], female    DOB: April 25, 1961, 62 y.o.   MRN: [id number]  HPI Very nice patient Virtual Visit via Video Note  I connected with Kathleen [name] on 12/24/22 at 11:40 AM EDT by a video enabled telemedicine application and verified that I am speaking with the correct person using two identifiers.  Location: Patient: Home Provider: Office   I discussed the limitations of evaluation and management by telemedicine and the availability of in person appointments. The patient expressed understanding and agreed to proceed.  History of Present Illness:    Observations/Objective:   Assessment and Plan:   Follow Up Instructions:    I discussed the assessment and treatment plan with the patient. The patient was provided an opportunity to ask questions and all were answered. The patient agreed with the plan and demonstrated an understanding of the instructions.   The patient was advised to call back or seek an in-person evaluation if the symptoms worsen or if the condition fails to improve as anticipated.  I provided 20 minutes of non-face-to-face time during this encounter.   [name], MD  Patient arrives for a follow up on recent back fx. Patient states she was able to get the Oxycontin but her husband is not able to help her due to his health issues ans she is having a difficult time getting to bathroom and performing ADL's  Review of Systems     Objective:   Physical Exam Patient had virtual visit-video Appears to be in no distress Atraumatic Neuro able to relate and oriented No apparent resp distress Color normal        Assessment & Plan:  1. Adrenal insufficiency (Addison's disease) (HCC) Will connect with Dr. Fransico Him to see if any adjustment needs to be made regarding treatment for her Addison's  2. Closed fracture of pedicle of lumbar vertebra, sequela She currently is on long-acting pain medicine twice a day as well as her  short acting pain medicine taking them approximately every 4 hours Denies any drowsiness Still having severe pain I tried to educate the patient that she will have severe pain with this type of injury and the goal of the pain medicine is to take the edge off the pain but will not totally take the pain away Also educated her not to overtake medicine to the point of feeling drugged or drowsy It should be noted that they tried to get Narcan but was told it would be $100 a cannot afford that   3. Encounter for long-term opiate analgesic use Very difficult situation to control her pain I would recommend consultation with pain medicine doctor We will touch base with patient later this week may have to increase the long-acting pain medicine from 30 mg to 40 mg   4. Interstitial lung disease (HCC) Currently is on prednisone taper and states her breathing is doing okay patient did not seem short of breath on physical exam  Should be noted would recommend a social service worker consult in order to try to help coordinate home services husband is overwhelmed.  Patient is pretty much bedbound with minimal movement getting up some.  I have offered short-term nursing home stay they do not want to do that   Home health consultation to see if they can work with her to get an aide to come to the house as well as a nurse to check in on her due to the fact fracture will also  need home physical therapy  Referral to pain management because she is requiring more than what we can prescribe long-term  Referral to orthopedics for back fracture to help guide treatment  This is a very challenging situation for the patient family as well as a provider.  We are trying to do the best we can at navigating her through this difficult situation.  She does not have a strong tolerance for pain but I am certain that the pain she is having from her back is very intense.  We are trying to navigate this in the safest way  possible.  Patient, husband understand this

## 2022-12-25 ENCOUNTER — Other Ambulatory Visit: Payer: Self-pay

## 2022-12-25 DIAGNOSIS — M549 Dorsalgia, unspecified: Secondary | ICD-10-CM

## 2022-12-26 ENCOUNTER — Other Ambulatory Visit: Payer: Self-pay | Admitting: Family Medicine

## 2022-12-26 ENCOUNTER — Telehealth: Payer: Self-pay

## 2022-12-26 ENCOUNTER — Other Ambulatory Visit (HOSPITAL_COMMUNITY): Payer: Self-pay

## 2022-12-26 MED ORDER — OXYCODONE HCL ER 40 MG PO T12A
40.0000 mg | EXTENDED_RELEASE_TABLET | Freq: Two times a day (BID) | ORAL | 0 refills | Status: DC
  Filled 2022-12-26: qty 28, 14d supply, fill #0

## 2022-12-26 MED ORDER — NALOXONE HCL 4 MG/0.1ML NA LIQD
NASAL | 4 refills | Status: DC
  Filled 2022-12-26: qty 2, 30d supply, fill #0

## 2022-12-26 MED ORDER — HYDROXYZINE HCL 25 MG PO TABS
25.0000 mg | ORAL_TABLET | Freq: Three times a day (TID) | ORAL | 1 refills | Status: DC | PRN
  Filled 2022-12-26: qty 90, 30d supply, fill #0
  Filled 2023-01-17 – 2023-01-19 (×4): qty 90, 30d supply, fill #1

## 2022-12-26 NOTE — Telephone Encounter (Signed)
Pt is calling she wanted to be seen on Mychart I told here we did not have no where to put her she is wanting to make sure she does have a blood clot. She said she can not go to hospital because she is unable to move and wants Dr Lorin Picket advice?  [name]

## 2022-12-27 ENCOUNTER — Encounter: Payer: Self-pay | Admitting: Family Medicine

## 2022-12-27 ENCOUNTER — Other Ambulatory Visit (HOSPITAL_COMMUNITY): Payer: Self-pay

## 2022-12-27 ENCOUNTER — Telehealth: Payer: Self-pay

## 2022-12-27 NOTE — Telephone Encounter (Signed)
Appeal went through and patient was able to get Oxycontin 40 mg #28- Patient ok with with referral to pain management and also stated she has a puffy bruise area on her arm and didn't know if she needed to do anything for it

## 2022-12-27 NOTE — Telephone Encounter (Signed)
Pt need pro authorization from [facility] for oxyCODONE (OXYCONTIN) 40 mg 12 hr tablet    [employer]

## 2022-12-27 NOTE — Telephone Encounter (Signed)
So I am very sympathetic to the patient But I did discuss her case with Autumn who related that the insurance stated that this medicine long-acting pain medicine would need prior approval it is quite possible will be denied because of our specialty is family medicine rather than pain management.  Nonetheless she may take the short acting pain medicine 1 every 3-4 hours as needed but not to exceed 8 tablets in a day Referral to pain management has already been done and she states she has an appointment in May As for the bruise on the arm this is not a sign of a blood clot but certainly if she feels that this is getting worse we can recheck it I would recommend that she set up a standard follow-up visit within 3 to 4 weeks with me hopefully by that point in time she is able to get out if not we can do it as a virtual She so feel free to give [place] an update next week how she is doing Appeal for her medicine was sent in awaiting the response by the insurance company for the OxyContin 40 mg twice daily

## 2022-12-27 NOTE — Telephone Encounter (Signed)
So I had a detailed discussion with the patient yesterday At that time she is having severe pain she is using the long-acting pain medicine twice a day and the short acting 1 every 4 hours taking anywhere between 3 and 5 of these daily She states the pain is still severe She has an appointment with pain management coming up in several weeks I recommended that we go up on the long-acting pain medicine Uncertain if her insurance will cover it I also cautioned her about the possibility of accidental overdoses if she takes too much pain medicine She assures [place] that the current dosing is not making her drowsy at all Narcan will be sent in Her husband was present and voiced understanding as well

## 2022-12-28 ENCOUNTER — Telehealth: Payer: Self-pay

## 2022-12-28 DIAGNOSIS — D869 Sarcoidosis, unspecified: Secondary | ICD-10-CM | POA: Diagnosis not present

## 2022-12-28 DIAGNOSIS — S32009D Unspecified fracture of unspecified lumbar vertebra, subsequent encounter for fracture with routine healing: Secondary | ICD-10-CM | POA: Diagnosis not present

## 2022-12-28 DIAGNOSIS — Z9181 History of falling: Secondary | ICD-10-CM | POA: Diagnosis not present

## 2022-12-28 DIAGNOSIS — I4891 Unspecified atrial fibrillation: Secondary | ICD-10-CM | POA: Diagnosis not present

## 2022-12-28 DIAGNOSIS — Z9981 Dependence on supplemental oxygen: Secondary | ICD-10-CM | POA: Diagnosis not present

## 2022-12-28 DIAGNOSIS — Z7952 Long term (current) use of systemic steroids: Secondary | ICD-10-CM | POA: Diagnosis not present

## 2022-12-28 DIAGNOSIS — J8410 Pulmonary fibrosis, unspecified: Secondary | ICD-10-CM | POA: Diagnosis not present

## 2022-12-28 DIAGNOSIS — E271 Primary adrenocortical insufficiency: Secondary | ICD-10-CM | POA: Diagnosis not present

## 2022-12-28 NOTE — Telephone Encounter (Signed)
Spoke with patient advised Per Dr Lorin Picket.

## 2022-12-28 NOTE — Telephone Encounter (Signed)
Called and left message to call office

## 2022-12-28 NOTE — Telephone Encounter (Signed)
Spoke with patient and advised per Dr Lorin Picket   she may take the short acting pain medicine 1 every 3-4 hours as needed but not to exceed 8 tablets in a day Referral to pain management has already been done and she states she has an appointment in May As for the bruise on the arm this is not a sign of a blood clot but certainly if she feels that this is getting worse we can recheck it I would recommend that she set up a standard follow-up visit within 3 to 4 weeks with me hopefully by that point in time she is able to get out if not we can do it as a virtual She so feel free to give [place] an update next week how she is doing Appeal for her medicine was sent in awaiting the response by the insurance company for the OxyContin 40 mg twice daily  Patient states she is hurting and denies going ahead and scheduling appointment.  Patient also states she got the first available pain management appointment in June.

## 2022-12-30 ENCOUNTER — Encounter: Payer: Self-pay | Admitting: Family Medicine

## 2022-12-30 ENCOUNTER — Telehealth: Payer: Self-pay | Admitting: Family Medicine

## 2022-12-30 NOTE — Telephone Encounter (Signed)
Please note [facility] stated that they received the referral They have scheduled her for intake appointment on January 29, 2023 We will continue with her pain medicines until they make a decision on if they are taking this over The main reason for them to see her and potentially take this over is because she is on increasing amounts of pain medicine which exceed our long-term guidelines

## 2022-12-31 ENCOUNTER — Other Ambulatory Visit: Payer: Self-pay | Admitting: Family Medicine

## 2022-12-31 ENCOUNTER — Encounter: Payer: Self-pay | Admitting: Family Medicine

## 2022-12-31 ENCOUNTER — Telehealth (INDEPENDENT_AMBULATORY_CARE_PROVIDER_SITE_OTHER): Payer: HMO | Admitting: Family Medicine

## 2022-12-31 ENCOUNTER — Telehealth: Payer: Self-pay

## 2022-12-31 DIAGNOSIS — Z79891 Long term (current) use of opiate analgesic: Secondary | ICD-10-CM | POA: Diagnosis not present

## 2022-12-31 DIAGNOSIS — B029 Zoster without complications: Secondary | ICD-10-CM

## 2022-12-31 MED ORDER — VALACYCLOVIR HCL 1 G PO TABS: 1000.0000 mg | ORAL_TABLET | Freq: Three times a day (TID) | ORAL | 0 refills | Status: DC

## 2022-12-31 NOTE — Progress Notes (Unsigned)
   Subjective:    Patient ID: Kathleen [name], female    DOB: February 14, 1961, 62 y.o.   MRN: [id number]  HPI Virtual Visit via Video Note  I connected with Kathleen [name] on 12/31/22 at  3:50 PM EDT by a video enabled telemedicine application and verified that I am speaking with the correct person using two identifiers.  Location: Patient: Home Provider: Office   I discussed the limitations of evaluation and management by telemedicine and the availability of in person appointments. The patient expressed understanding and agreed to proceed.  History of Present Illness:    Observations/Objective:   Assessment and Plan:   Follow Up Instructions:    I discussed the assessment and treatment plan with the patient. The patient was provided an opportunity to ask questions and all were answered. The patient agreed with the plan and demonstrated an understanding of the instructions.   The patient was advised to call back or seek an in-person evaluation if the symptoms worsen or if the condition fails to improve as anticipated.  I provided 15-20 minutes of non-face-to-face time during this encounter.   [name], MD    Review of Systems     Objective:   Physical Exam Picture from my chart review Patient had virtual visit-video Appears to be in no distress Atraumatic Neuro able to relate and oriented No apparent resp distress Color normal        Assessment & Plan:  Pedicle fracture-sees orthopedics tomorrow Shingles-Valtrex 3 times daily 7 days Severe chronic pain worsened by recent fracture Also has underlying osteonecrosis of the femoral head This past Friday we initiated OxyContin 40 mg twice daily She is having to use short acting oxycodone for breakthrough pain 2 or 3 times per day She states no drowsiness She was cautioned regarding overuse of frequent use She sees pain management early June She will send [place] an update later this week

## 2022-12-31 NOTE — Telephone Encounter (Signed)
Patient scheduled my chart video visit with Dr Lorin Picket today(12/31/22) at 4:30pm

## 2022-12-31 NOTE — Telephone Encounter (Signed)
Staff I sent in a prescription for her Valtrex Recommend 1 taken 3 times daily for 7 days at [facility] on [address]  I recommend a virtual visit this week regarding this-IT will either have to be at the end of the day It cannot be at lunchtime Tuesday or Wednesday because of meetings  So perhaps a 430 virtual visit

## 2022-12-31 NOTE — Telephone Encounter (Signed)
Pt called left voice mail wanting a nurse to call her or appt and we do not have appt today  [name] [phone]

## 2023-01-02 ENCOUNTER — Encounter: Payer: Self-pay | Admitting: Orthopaedic Surgery

## 2023-01-02 ENCOUNTER — Ambulatory Visit (INDEPENDENT_AMBULATORY_CARE_PROVIDER_SITE_OTHER): Payer: HMO | Admitting: Orthopaedic Surgery

## 2023-01-02 ENCOUNTER — Telehealth: Payer: Self-pay | Admitting: Family Medicine

## 2023-01-02 ENCOUNTER — Other Ambulatory Visit: Payer: Self-pay | Admitting: Family Medicine

## 2023-01-02 ENCOUNTER — Ambulatory Visit: Payer: HMO | Admitting: Orthopaedic Surgery

## 2023-01-02 VITALS — Ht 68.0 in | Wt 261.0 lb

## 2023-01-02 DIAGNOSIS — G894 Chronic pain syndrome: Secondary | ICD-10-CM

## 2023-01-02 DIAGNOSIS — S32009A Unspecified fracture of unspecified lumbar vertebra, initial encounter for closed fracture: Secondary | ICD-10-CM

## 2023-01-02 MED ORDER — OXYCODONE HCL ER 40 MG PO T12A
40.0000 mg | EXTENDED_RELEASE_TABLET | Freq: Two times a day (BID) | ORAL | 0 refills | Status: DC
  Filled 2023-01-02: qty 28, 14d supply, fill #0

## 2023-01-02 NOTE — Progress Notes (Signed)
Office Visit Note   Patient: Kathleen [name]           Date of Birth: October 26, 1960           MRN: [id number] Visit Date: 01/02/2023              Requested by: [name], MD [address] [place],  [place] [address] PCP: [name], MD   Assessment & Plan: Visit Diagnoses:  1. Closed fracture of transverse process of lumbar vertebra, initial encounter (HCC)   2. Chronic pain syndrome     Plan: Reviewed imaging studies with patient and husband.  Discussed with her that iliopsoas muscle attached to the transverse process is going to be painful and with her deconditioned body obesity chronic pain problems that this will take some time and should take care of itself with improvement after several weeks.  Discussed with her that her brace, therapy will not help she can use some ice or heating pad.  She is already on high doses of narcotics.  We discussed problems with pain with injuries when patient is already on high doses of narcotic medication.  She can follow-up with me and 6 weeks for repeat imaging [facility] lateral of the lumbar spine to evaluate her L2 transverse process fracture on the right.  Follow-Up Instructions: Return in about 6 weeks (around 02/13/2023).   Orders:  No orders of the defined types were placed in this encounter.  No orders of the defined types were placed in this encounter.     Procedures: No procedures performed   Clinical Data: No additional findings.   Subjective: Chief Complaint  Patient presents with   Lower Back - Pain    Fall 12/18/2022    HPI 62 year old female with a history of left hip avascular necrosis started several years ago on pain management and 2 weeks ago had a fall.  She had rib pain negative rib fractures.  She did have a L2 transverse process fracture on the right and has severe pain when she tries to flex her right hip or move her right leg and states she cannot walk.  She been on chronic opioids.  She has been on  OxyContin 40 mg p.o. twice daily oxycodone 10 mg normally 180 tablets monthly.  Recent she had a few Dilaudid tablets.  She was given 14 oxycodone 30 mg tablets by Dr. Gerda Diss.  Patient uses her scooter/walker and rollator.  States she is numb from the waist down but does have sensation with testing.  Review of Systems positive COPD sarcoidosis on chronic oxygen portable at this moment.  Chronic opioid use, fibromyalgia peripheral neuropathy.   Objective: Vital Signs: Ht 5\' 8"  (1.727 m)   Wt 261 lb (118.4 kg)   LMP 05/17/2016   BMI 39.68 kg/m   Physical Exam Constitutional:      Appearance: She is well-developed.     Comments: Complaining of severe pain with lumbar motion  or  or right hip.  HENT:     Head: Normocephalic.     Right Ear: External ear normal.     Left Ear: External ear normal. There is no impacted cerumen.  Eyes:     Pupils: Pupils are equal, round, and reactive to light.  Neck:     Thyroid: No thyromegaly.     Trachea: No tracheal deviation.  Cardiovascular:     Rate and Rhythm: Normal rate.  Pulmonary:     Effort: Pulmonary effort is normal.  Abdominal:     Palpations: Abdomen is soft.  Musculoskeletal:     Cervical back: No rigidity.  Skin:    General: Skin is warm and dry.  Neurological:     Mental Status: She is alert and oriented to person, place, and time.  Psychiatric:        Behavior: Behavior normal.     Ortho Exam sensation over the thigh is intact.  She has sharp pain and is unwilling to flex her right hip she can flex her left hip weekly and let to drop.  Specialty Comments:  No specialty comments available.  Imaging: No results found.   PMFS History: Patient Active Problem List   Diagnosis Date Noted   Lumbar transverse process fracture (HCC) 01/02/2023   Acute on chronic hypoxic respiratory failure (HCC) 12/13/2022   Depression, major, single episode, moderate (HCC) 09/10/2022   Aortic atherosclerosis (HCC) 09/10/2022   Chronic  respiratory failure with hypoxia (HCC) 05/22/2021   Anxiety and depression 05/22/2021   Vitamin D deficiency 05/09/2021   Left foot drop 01/09/2021   Pulmonary HTN (HCC) 10/12/2020   Encounter for long-term opiate analgesic use 02/02/2020   Interstitial lung disease (HCC) 04/14/2019   GERD (gastroesophageal reflux disease) 10/15/2017   Personal history of noncompliance with medical treatment, presenting hazards to health 04/26/2017   Hypothyroidism 01/15/2017   Adrenal insufficiency (Addison's disease) (HCC) 08/22/2016   Symptomatic bradycardia    Chronic pain syndrome 04/20/2016   Irritable bowel syndrome with constipation 11/14/2015   Avascular necrosis of bone of hip, left (HCC) 04/05/2015   Post herpetic neuralgia 10/22/2014   Mixed hyperlipidemia 08/25/2014   Obstructive sleep apnea 02/18/2014   Class 2 severe obesity due to excess calories with serious comorbidity and body mass index (BMI) of 38.0 to 38.9 in adult [facility]) 05/06/2013   Fibromyalgia 02/11/2013   Orthostatic hypotension 01/16/2013   Fibrocystic breast disease in female 04/09/2012   Sarcoidosis 06/21/2008   Past Medical History:  Diagnosis Date   Adrenal insufficiency (HCC)    Anemia    Anxiety    Avascular bone necrosis (HCC)    Breast fibrocystic disorder    Chronic pain    COPD (chronic obstructive pulmonary disease) (HCC)    Encounter for long-term opiate analgesic use 02/02/2020   Patient has chronic pain with femoral head avascular necrosis.  Is under pain contract   Fibromyalgia    Gastroesophageal reflux disease    Hypothyroidism    Mitral valve prolapse    Orthostatic hypotension    Peripheral neuropathy    PONV (postoperative nausea and vomiting)    Restrictive lung disease    Moderate to severe   Sarcoidosis    Biopsy proven - [facility]   SOB (shortness of breath)    chronic    Family History  Problem Relation Age of Onset   Cardiomyopathy Mother    Breast cancer Mother    Prostate cancer  Father    Heart attack Maternal Grandmother    Heart attack Maternal Grandfather    Heart attack Paternal Grandmother    Bipolar disorder Daughter    Heart disease Maternal Aunt    Neurofibromatosis Cousin    Colon cancer Neg Hx     Past Surgical History:  Procedure Laterality Date   BIOPSY  10/17/2018   Procedure: BIOPSY;  Surgeon: Malissa Hippo, MD;  [facility];  Service: Endoscopy;;  duodenum, antrum, gastric body   BIOPSY  03/03/2021   Procedure: BIOPSY;  Surgeon: Karilyn Cota,  Joline Maxcy, MD;  Location[facility];  Service: Endoscopy;;   BREAST LUMPECTOMY Bilateral 01/12/2011   CHOLECYSTECTOMY N/A 04/17/2021   Procedure: LAPAROSCOPIC CHOLECYSTECTOMY;  Surgeon: Franky Macho, MD;  Location: [facility];  Service: General;  Laterality: N/A;   COLONOSCOPY WITH PROPOFOL N/A 01/03/2022   Procedure: COLONOSCOPY WITH PROPOFOL;  Surgeon: Malissa Hippo, MD;  [facility];  Service: Endoscopy;  Laterality: N/A;  220   ESOPHAGEAL DILATION N/A 03/03/2021   Procedure: ESOPHAGEAL DILATION;  Surgeon: Malissa Hippo, MD;  [facility];  Service: Endoscopy;  Laterality: N/A;   ESOPHAGOGASTRODUODENOSCOPY (EGD) WITH PROPOFOL N/A 10/17/2018   Procedure: ESOPHAGOGASTRODUODENOSCOPY (EGD) WITH PROPOFOL;  Surgeon: Malissa Hippo, MD;  [facility];  Service: Endoscopy;  Laterality: N/A;  2:25   ESOPHAGOGASTRODUODENOSCOPY (EGD) WITH PROPOFOL N/A 03/03/2021   Procedure: ESOPHAGOGASTRODUODENOSCOPY (EGD) WITH PROPOFOL;  Surgeon: Malissa Hippo, MD;  [facility];  Service: Endoscopy;  Laterality: N/A;  12:15   POLYPECTOMY  01/03/2022   Procedure: POLYPECTOMY INTESTINAL;  Surgeon: Malissa Hippo, MD;  [facility];  Service: Endoscopy;;   TUBAL LIGATION     Social History   Occupational History   Occupation: Educational psychologist: [employer]  Tobacco Use   Smoking status: Never    Passive exposure: Never   Smokeless  tobacco: Never  Vaping Use   Vaping Use: Never used  Substance and Sexual Activity   Alcohol use: No   Drug use: No   Sexual activity: Yes    Birth control/protection: Post-menopausal

## 2023-01-02 NOTE — Telephone Encounter (Signed)
Recommend follow-up office visit with myself the week of May 20

## 2023-01-03 ENCOUNTER — Other Ambulatory Visit (HOSPITAL_COMMUNITY): Payer: Self-pay

## 2023-01-08 ENCOUNTER — Telehealth: Payer: Self-pay | Admitting: *Deleted

## 2023-01-08 NOTE — Telephone Encounter (Signed)
[facility] was ordered by PCP.  Anything that you would like for them to focus on or defer to PCP?

## 2023-01-08 NOTE — Telephone Encounter (Signed)
Mr. Littie Deeds Physical therapy with [facility] needs to know what direction he needs for PT on pt as to what she is needing. Please advise. [phone]

## 2023-01-09 ENCOUNTER — Other Ambulatory Visit (HOSPITAL_COMMUNITY): Payer: Self-pay

## 2023-01-09 ENCOUNTER — Ambulatory Visit (INDEPENDENT_AMBULATORY_CARE_PROVIDER_SITE_OTHER): Payer: HMO | Admitting: Family Medicine

## 2023-01-09 DIAGNOSIS — Z79891 Long term (current) use of opiate analgesic: Secondary | ICD-10-CM | POA: Diagnosis not present

## 2023-01-09 DIAGNOSIS — B029 Zoster without complications: Secondary | ICD-10-CM

## 2023-01-09 DIAGNOSIS — S32009S Unspecified fracture of unspecified lumbar vertebra, sequela: Secondary | ICD-10-CM

## 2023-01-09 MED ORDER — VALACYCLOVIR HCL 1 G PO TABS
1000.0000 mg | ORAL_TABLET | Freq: Three times a day (TID) | ORAL | 0 refills | Status: DC
  Filled 2023-01-09: qty 21, 7d supply, fill #0

## 2023-01-09 MED ORDER — OXYCODONE HCL 10 MG PO TABS
10.0000 mg | ORAL_TABLET | Freq: Four times a day (QID) | ORAL | 0 refills | Status: DC | PRN
  Filled 2023-01-09: qty 120, 30d supply, fill #0

## 2023-01-09 MED ORDER — OXYCODONE HCL ER 40 MG PO T12A
40.0000 mg | EXTENDED_RELEASE_TABLET | Freq: Two times a day (BID) | ORAL | 0 refills | Status: DC
  Filled 2023-01-09 – 2023-01-10 (×2): qty 60, 30d supply, fill #0

## 2023-01-09 NOTE — Progress Notes (Signed)
Subjective:    Patient ID: Kathleen [name], female    DOB: [birth year], 62 y.o.   MRN: [id number]  HPI  Patient arrives for follow up from compression fracture I am sympathetic to the patient she states she has a lot of severe pain in her back We have adjusted her pain medicine The level of her pain management is exceeding our guidelines I have encouraged the patient that as she improves with her back over the next 6 to 8 weeks to try to taper back down toward her original levels Previously she was on oxycodone 10 mg maximum 6/day typical day she will take 6/day due to severe hip pain from avascular necrosis It should be noted that because of her underlying lung condition and the medication she is on none of the orthopedic surgeons feel that she can do the hip surgery without risk of infection so therefore she is put up with it She does have underlying stress issues and this has made it difficult for her to cope as well with the pain but as a result she is on 5 or 6 of the oxycodone per day Recently she took a fall and fractured a pedicle and there is no treatment for this other than time Saw orthopedic surgeon and told him 6 to 12 weeks to see improvement Her pain was so severe that she was taken Dilaudid with oxycodone without relief I shifted her to long-acting oxycodone and have gradually increased her up to 40 mg twice daily and she may utilize the plain oxycodone for breakthrough pain but no more than 4/day She does have Narcan at home and her husband states he controls her medicines and states that she is not drowsy with the medicine Recently she was referred to pain management but that was outside of her neck work We will work on setting her up with [facility] We will make sure proper documentation is sent to them  This patient was seen today for chronic pain  The medication list was reviewed and updated.   Location of Pain for which the patient has been  treated with regarding narcotics: Avascular necrosis of the hip as well as fractured pedicle  Onset of this pain: Avascular necrosis of the hip for several years no fracture pedicle more recently with a fall   -Compliance with medication: Good compliance  - Number patient states they take daily: She takes a long-acting twice a day, she takes short acting oxycodone approximately 4 or 5 times per day  -Reason for ongoing use of opioids originally is because of avascular necrosis of the hip she was originally on oxycodone 10 mg 1 every 4 hours approximately 6/day  What other measures have been tried outside of opioids Tylenol stretches she cannot tolerate NSAIDs because of the medicine she is on increased risk of bleeding ulcers  In the ongoing specialists regarding this condition she is seen numerous orthopedic surgeons for this hip condition none of them feel surgery is capable because of her underlying interstitial lung disease  -when was the last dose patient took?  Earlier today  The patient was advised the importance of maintaining medication and not using illegal substances with these.  Here for refills and follow up  The patient was educated that we can provide 3 monthly scripts for their medication, it is their responsibility to follow the instructions.  Side effects or complications from medications: Denies drowsiness  Patient is aware that pain medications are meant  to minimize the severity of the pain to allow their pain levels to improve to allow for better function. They are aware of that pain medications cannot totally remove their pain.  Due for UDT ( at least once per year) (pain management contract is also completed at the time of the UDT): She got a urine drug screen today she gets 1 yearly her husband controls her medicines  Scale of 1 to 10 ( 1 is least 10 is most) Your pain level without the medicine: 9-10 Your pain level with medication 5  Scale 1 to 10 ( 1-helps  very little, 10 helps very well) How well does your pain medication reduce your pain so you can function better through out the day? 7 currently right now she is mainly chair and bedbound the orthopedic surgeon told her not to do much other than moving around the house  Quality of the pain: Throbbing aching pain  Persistence of the pain: Persistent all the time  Modifying factors: Worse with activity      Review of Systems     Objective:   Physical Exam   General-in no acute distress Eyes-no discharge Lungs-respiratory rate normal, CTA CV-no murmurs,RRR Extremities skin warm dry no edema Neuro grossly normal Behavior normal, alert      Assessment & Plan:  1. Encounter for long-term opiate analgesic use Referral to pain management The first pain management we sent him to states that he was she was out of network Continue OxyContin 40 mg twice daily Oxycodone for breakthrough pain pneumonia and for those per day  Signs discussed, warning signs regarding accidental overdose discussed, we will help set up with [facility] Has Narcan at home Husband controls medication - ToxASSU[employer] Select 13 (MW), Urine - Ambulatory referral to Pain Clinic  2. Herpes zoster without complication Continue valacyclovir  3. Closed fracture of pedicle of lumbar vertebra, sequela Sees orthopedics again in follow-up in 6 weeks  Follow-up with [place] in 8 weeks  Pain management will take over long-term acting pain medicines

## 2023-01-10 ENCOUNTER — Other Ambulatory Visit: Payer: Self-pay

## 2023-01-10 ENCOUNTER — Other Ambulatory Visit (HOSPITAL_COMMUNITY): Payer: Self-pay

## 2023-01-10 LAB — MED LIST OPTION NOT SELECTED

## 2023-01-11 ENCOUNTER — Other Ambulatory Visit (HOSPITAL_COMMUNITY): Payer: Self-pay

## 2023-01-11 ENCOUNTER — Telehealth: Payer: Self-pay

## 2023-01-11 NOTE — Telephone Encounter (Signed)
Pt calling need Home Health Aid to come out to give bath she is unable to care for her self at this time  Can something be sent back for doxycyline for abscess?   Redge Gainer   [place] [phone]

## 2023-01-11 NOTE — Telephone Encounter (Signed)
Pt calling need Home Health Aid to come out to give bath she is unable to care for her self at this time   Can something be sent back for doxycyline for abscess?    Redge Gainer

## 2023-01-12 NOTE — Telephone Encounter (Signed)
Please put in referral for community use resources/social service worker to work with him to try to get someone to come out to help them with bathing  May have doxycycline 100 mg 1 twice daily for 7 days warm compresses if ongoing troubles follow-up

## 2023-01-14 ENCOUNTER — Encounter: Payer: Self-pay | Admitting: *Deleted

## 2023-01-14 ENCOUNTER — Ambulatory Visit: Payer: Self-pay | Admitting: *Deleted

## 2023-01-14 ENCOUNTER — Telehealth: Payer: Self-pay

## 2023-01-14 ENCOUNTER — Other Ambulatory Visit: Payer: Self-pay

## 2023-01-14 ENCOUNTER — Other Ambulatory Visit (HOSPITAL_COMMUNITY): Payer: Self-pay

## 2023-01-14 DIAGNOSIS — J9611 Chronic respiratory failure with hypoxia: Secondary | ICD-10-CM

## 2023-01-14 DIAGNOSIS — J849 Interstitial pulmonary disease, unspecified: Secondary | ICD-10-CM

## 2023-01-14 DIAGNOSIS — S32009S Unspecified fracture of unspecified lumbar vertebra, sequela: Secondary | ICD-10-CM

## 2023-01-14 LAB — TOXASSURE SELECT 13 (MW), URINE

## 2023-01-14 MED ORDER — DOXYCYCLINE HYCLATE 100 MG PO TABS
100.0000 mg | ORAL_TABLET | Freq: Two times a day (BID) | ORAL | 0 refills | Status: AC
  Filled 2023-01-14: qty 14, 7d supply, fill #0

## 2023-01-14 NOTE — Patient Instructions (Signed)
Visit Information  Thank you for taking time to visit with me today. Please don't hesitate to contact me if I can be of assistance to you.   Following are the goals we discussed today:   Goals Addressed             This Visit's Progress    [facility] care coordination services (home health bath aide)   Not on track    Interventions Today    Flowsheet Row Most Recent Value  Chronic Disease   Chronic disease during today's visit Other  [Request for home health aide for bath. Closed fracture of pedicle of lumbar vertebra. Confirmed patient preferred agency [facility]. Active with Frances Furbish RN only at this time]  General Interventions   General Interventions Discussed/Reviewed General Interventions Discussed, Communication with, Risk analyst with RN  [Spoke with pcp RN Tami to request an order for [place] home health aide]  Exercise Interventions   Exercise Discussed/Reviewed Exercise Discussed, Physical Activity  [confirmed [facility] health PT postpone until patient is able to tolerate it better]  Physical Activity Discussed/Reviewed Physical Activity Reviewed  [Decrease mobility related to her fracture]  Education Interventions   Education Provided Provided Education  Provided Verbal Education On Other  [Process to obtain a home health agency bath aide]  Mental Health Interventions   Mental Health Discussed/Reviewed Mental Health Discussed, Coping Strategies  Safety Interventions   Safety Discussed/Reviewed Safety Discussed, Home Safety  Home Safety Assistive Devices              Our next appointment is by telephone on 01/16/23 at 1045  Please call the care guide team at [phone] if you need to cancel or reschedule your appointment.   If you are experiencing a Mental Health or Behavioral Health Crisis or need someone to talk to, please call the Suicide and Crisis Lifeline: 988 call the [place] National Suicide Prevention Lifeline: 878-639-2024 or TTY: (309) 382-3315  TTY 732-743-0460) to talk to a trained counselor call 1-800-273-TALK (toll free, 24 hour hotline) call the [facility]: 262-405-6608 call 911   Patient verbalizes understanding of instructions and care plan provided today and agrees to view in MyChart. Active MyChart status and patient understanding of how to access instructions and care plan via MyChart confirmed with patient.     The patient has been provided with contact information for the care management team and has been advised to call with any health related questions or concerns.   Rukiya Hodgkins L. [name], RN, BSN, CCM [facility] Care Management Community Coordinator Office number [phone]

## 2023-01-14 NOTE — Telephone Encounter (Signed)
Orders placed and patient made aware per drs orders.

## 2023-01-14 NOTE — Telephone Encounter (Signed)
I have spoke to Kathleen [name] she called Kathleen [name] and she is with Kathleen [name] the PT has been suspended but the [facility] for bathing orders need to be faxed to [phone]

## 2023-01-14 NOTE — Patient Outreach (Signed)
  Care Coordination   Initial Visit Note   01/14/2023 Name: Kathleen [name] MRN: [id number] DOB: [birth year]  Kathleen [name] is a 62 y.o. year old female who sees Luking, Jonna Coup, MD for primary care. I spoke with  Kathleen [name] by phone today.  What matters to the patients health and wellness today?  Home health aide to assist with bath Patient states she has "not had a bath in three weeks"   Goals Addressed             This Visit's Progress    [facility] care coordination services (home health bath aide)   Not on track    Interventions Today    Flowsheet Row Most Recent Value  Chronic Disease   Chronic disease during today's visit Other  [Request for home health aide for bath. Closed fracture of pedicle of lumbar vertebra. Confirmed patient preferred agency [facility]. Active with Frances Furbish RN only at this time]  General Interventions   General Interventions Discussed/Reviewed General Interventions Discussed, Communication with, Risk analyst with RN  [Spoke with pcp RN Tami to request an order for [place] home health aide]  Exercise Interventions   Exercise Discussed/Reviewed Exercise Discussed, Physical Activity  [confirmed [facility] health PT postpone until patient is able to tolerate it better]  Physical Activity Discussed/Reviewed Physical Activity Reviewed  [Decrease mobility related to her fracture]  Education Interventions   Education Provided Provided Education  Provided Verbal Education On Other  [Process to obtain a home health agency bath aide]  Mental Health Interventions   Mental Health Discussed/Reviewed Mental Health Discussed, Coping Strategies  Safety Interventions   Safety Discussed/Reviewed Safety Discussed, Home Safety  Home Safety Assistive Devices              SDOH assessments and interventions completed:  Yes  SDOH Interventions Today    Flowsheet Row Most Recent Value  SDOH Interventions   Food Insecurity Interventions Intervention  Not Indicated  Housing Interventions Intervention Not Indicated  Transportation Interventions Intervention Not Indicated  Stress Interventions Intervention Not Indicated        Care Coordination Interventions:  Yes, provided   Follow up plan: Follow up call scheduled for 01/16/23    Encounter Outcome:  Pt. Visit Completed   Javarie Crisp L. [name], RN, BSN, CCM [facility] Care Management Community Coordinator Office number [phone]

## 2023-01-14 NOTE — Telephone Encounter (Signed)
Spoke with the patient to inform her of her medication orders for doxycycline, she states that she is taking her pain medications as directed however medications are helping but not completely taking care of the pain, patient is available on mychart. Please advise

## 2023-01-15 ENCOUNTER — Ambulatory Visit: Payer: Self-pay | Admitting: *Deleted

## 2023-01-15 LAB — SPECIMEN STATUS REPORT

## 2023-01-15 NOTE — Patient Instructions (Signed)
Visit Information  Thank you for taking time to visit with me today. Please don't hesitate to contact me if I can be of assistance to you.   Following are the goals we discussed today:   Goals Addressed             This Visit's Progress    [facility] care coordination services (home health bath aide)   Not on track    Interventions Today    Flowsheet Row Most Recent Value  Chronic Disease   Chronic disease during today's visit Other  [home health aide]  General Interventions   General Interventions Discussed/Reviewed General Interventions Reviewed, Communication with, Risk analyst with PCP/Specialists  Artist Whitney]  Education Interventions   Education Provided Provided Education  [process for ordering, scheduling a bath aide via most home health agencies like [facility]]  Provided Verbal Education On Other, [employer]  Pharmacy Interventions   Pharmacy Dicussed/Reviewed Pharmacy Topics Discussed, Affording Medications              Our next appointment is by telephone on 01/16/23 at 1045  Please call the care guide team at [phone] if you need to cancel or reschedule your appointment.   If you are experiencing a Mental Health or Behavioral Health Crisis or need someone to talk to, please call the Suicide and Crisis Lifeline: 988 call the [place] National Suicide Prevention Lifeline: 647-404-2165 or TTY: 605 574 6209 TTY 765-615-1388) to talk to a trained counselor call 1-800-273-TALK (toll free, 24 hour hotline) call the [facility]: [phone] call 911   Patient verbalizes understanding of instructions and care plan provided today and agrees to view in MyChart. Active MyChart status and patient understanding of how to access instructions and care plan via MyChart confirmed with patient.     The patient has been provided with contact information for the care management team and has been advised to call with any health  related questions or concerns.   Angelina Neece L. [name], RN, BSN, CCM [facility] Care Management Community Coordinator Office number [phone]

## 2023-01-15 NOTE — Patient Outreach (Signed)
  Care Coordination   Follow Up Visit Note   01/15/2023 Name: Kathleen [name] MRN: [id number] DOB: 1960-12-01  Kathleen [name] is a 62 y.o. year old female who sees Luking, Jonna Coup, MD for primary care. I spoke with  Kathleen [name] by phone today.  What matters to the patients health and wellness today?  Patient called [facility] RN CM to inquire if a [facility] home aide would visit today She voices understanding after a conference call with completed with her to [facility], that they are pending a home health aide to schedule and hopes to have one in the next 1-2 days She also has pending therapy visits the week (PT & OT)   Patient and Alphonzo Lemmings confirmed correct numbers for contact  Patient confirms she will check all her voice messages if calls are missed and promptly return a call to [facility]      Goals Addressed             This Visit's Progress    [facility] care coordination services (home health bath aide)   Not on track    Interventions Today    Flowsheet Row Most Recent Value  Chronic Disease   Chronic disease during today's visit Other  [home health aide]  General Interventions   General Interventions Discussed/Reviewed General Interventions Reviewed, Communication with, Risk analyst with PCP/Specialists  Artist Whitney]  Education Interventions   Education Provided Provided Education  [process for ordering, scheduling a bath aide via most home health agencies like [facility]]  Provided Verbal Education On Other, Teacher, early years/pre Interventions   Pharmacy Dicussed/Reviewed Pharmacy Topics Discussed, Affording Medications              SDOH assessments and interventions completed:  No     Care Coordination Interventions:  Yes, provided   Follow up plan: Follow up call scheduled for 01/16/23    Encounter Outcome:  Pt. Visit Completed   Evanell Redlich L. [name], RN, BSN, CCM [facility] Care Management Community Coordinator Office number [phone]

## 2023-01-15 NOTE — Telephone Encounter (Signed)
Sympathetic to what is going on with the patient if her knee infection area is getting worse I would recommend to be seen As for the chronic pain management currently right now she is at the highest dose that I feel comfortable prescribing which is the long-acting oxycodone 40 mg twice daily along with the short acting the that may be utilized up to 4 times per day Please find out for the patient has [facility] extended in a office visit date as of yet?  They were going to be taking over her chronic pain management  The also will be important for her to give [place] an update on how she is doing with the all of this by June 10 so that way if we need to do an additional long-acting pain medicine we are aware she should have enough to last her through 14 June  If any problems questions or concerns let me know thank you

## 2023-01-16 ENCOUNTER — Ambulatory Visit: Payer: Self-pay | Admitting: *Deleted

## 2023-01-16 NOTE — Patient Outreach (Signed)
  Care Coordination   Follow Up Visit Note   01/16/2023 Name: Kathleen [name] MRN: [id number] DOB: 05-16-1961  Kathleen [name] is a 62 y.o. year old female who sees Luking, Jonna Coup, MD for primary care. I spoke with  Kathleen [name] by phone today.  What matters to the patients health and wellness today?  Has not received a call from [facility] about a bath aide yet She reports being in the bed, not able to get to her home phone only her mobile phone  She states what matters most of all today is to get assistance with a bath Agrees to allow RN CM to check on the status of her [facility] bath aide   Patient had a call coming in on her home number, she can not reach the phone as she is in bed. She will check the voice message and return a call as needed She called RN CM back to updated her that the call was not from [facility] Mr Gust heard in the background voicing concern about "that is what they said for the last week" when RN CM was reviewing the calls from RN CM and the calls made to [facility] in the last few days. Patient stated "I'm sorry I must have confused it" (confused hearing that the RN CM would call her every 1-2 days to follow up on a pending call from [facility] vs the aide would be out today)   Goals Addressed             This Visit's Progress    [facility] care coordination services (home health bath aide)   Not on track    Interventions Today    Flowsheet Row Most Recent Value  Chronic Disease   Chronic disease during today's visit Other  [home health bath aide Frances Furbish, best outreach number, pain managment appointment with Bethany]  General Interventions   General Interventions Discussed/Reviewed General Interventions Reviewed, [employer], Doctor Visits, Communication with  Doctor Visits Discussed/Reviewed Doctor Visits Discussed, Specialist  [pain [employer] scheudled per patient for next week]  PCP/Specialist Visits Compliance with follow-up visit  Communication with  PCP/Specialists  [spoke with whitney at [facility]- reports patient is on the list for 01/17/23 and would receive a call on today for the time. RN CM encouraged first calling patient on her mobile Updated patient & spouse Clarified calls to her,when she & RN CM called [facility]]  Pharmacy Interventions   Pharmacy Dicussed/Reviewed Pharmacy Topics Reviewed, Medications and their functions              SDOH assessments and interventions completed:  No     Care Coordination Interventions:  Yes, provided   Follow up plan: Follow up call scheduled for 01/17/23    Encounter Outcome:  Pt. Visit Completed   Adah Stoneberg L. [name], RN, BSN, CCM [facility] Care Management Community Coordinator Office number [phone]

## 2023-01-16 NOTE — Patient Instructions (Addendum)
Visit Information  Thank you for taking time to visit with me today. Please don't hesitate to contact me if I can be of assistance to you.   Following are the goals we discussed today:   Goals Addressed             This Visit's Progress    [facility] care coordination services (home health bath aide)   Not on track    Interventions Today    Flowsheet Row Most Recent Value  Chronic Disease   Chronic disease during today's visit Other  [home health bath aide Frances Furbish, best outreach number, pain managment appointment with Bethany]  General Interventions   General Interventions Discussed/Reviewed General Interventions Reviewed, [employer], Doctor Visits, Communication with  Doctor Visits Discussed/Reviewed Doctor Visits Discussed, Specialist  [pain [employer] medical scheudled per patient for next week]  PCP/Specialist Visits Compliance with follow-up visit  Communication with PCP/Specialists  [spoke with whitney at [facility]- reports patient is on the list for 01/17/23 and would receive a call on today for the time. RN CM encouraged first calling patient on her mobile Updated patient & spouse Clarified calls to her,when she & RN CM called [facility]]  Pharmacy Interventions   Pharmacy Dicussed/Reviewed Pharmacy Topics Reviewed, Medications and their functions              Our next appointment is by telephone on 01/17/23 at 1145  Please call the care guide team at [phone] if you need to cancel or reschedule your appointment.   If you are experiencing a Mental Health or Behavioral Health Crisis or need someone to talk to, please call the Suicide and Crisis Lifeline: 988 call the [place] National Suicide Prevention Lifeline: 336-009-5732 or TTY: 4701284114 TTY 770 388 4363) to talk to a trained counselor call 1-800-273-TALK (toll free, 24 hour hotline) call the [facility]: [phone] call 911   Patient verbalizes understanding of instructions and care  plan provided today and agrees to view in MyChart. Active MyChart status and patient understanding of how to access instructions and care plan via MyChart confirmed with patient.     The patient has been provided with contact information for the care management team and has been advised to call with any health related questions or concerns.    Kathleen Joo L. [name], RN, BSN, CCM [facility] Care Management Community Coordinator Office number [phone]

## 2023-01-17 ENCOUNTER — Other Ambulatory Visit (HOSPITAL_COMMUNITY): Payer: Self-pay

## 2023-01-17 ENCOUNTER — Ambulatory Visit: Payer: Self-pay | Admitting: *Deleted

## 2023-01-17 ENCOUNTER — Encounter: Payer: Self-pay | Admitting: *Deleted

## 2023-01-17 ENCOUNTER — Other Ambulatory Visit: Payer: Self-pay | Admitting: Family Medicine

## 2023-01-17 MED ORDER — ESCITALOPRAM OXALATE 10 MG PO TABS
10.0000 mg | ORAL_TABLET | Freq: Every day | ORAL | 1 refills | Status: DC
  Filled 2023-01-17: qty 90, 90d supply, fill #0

## 2023-01-17 NOTE — Patient Outreach (Signed)
  Care Coordination   Multidisciplinary Case Review Note    01/17/2023 Name: Kathleen [name] MRN: [id number] DOB: 04-15-1961  Kathleen [name] is a 62 y.o. year old female who sees Luking, Jonna Coup, MD for primary care.  The  multidisciplinary care team met today to review patient care needs and barriers.     Goals Addressed             This Visit's Progress    [facility] care coordination services (home health bath aide)   On track    Interventions Today    Flowsheet Row Most Recent Value  Chronic Disease   Chronic disease during today's visit Other  [[facility] bath aide]  General Interventions   General Interventions Discussed/Reviewed General Interventions Reviewed, Communication with  Communication with --  [THn  multidisciplinary team discussion, outreach to [facility] to confirm the plan has not changed. The patient will receive OT services first and then the aide will be with OT to help with patient bath]  Exercise Interventions   Exercise Discussed/Reviewed Exercise Reviewed, Physical Activity  [updated patient on the call to [facility]. Confirmed 1159 she did receive a call from the OT about visiting around 1200 01/17/23 Discussed OT & aide tasks with patient. Discussed the importance of OT for activity of daily living assessment before the aide]  Education Interventions   Education Provided Provided Education  Provided Verbal Education On Medication, Other, Mental Health/Coping with Illness  Mental Health Interventions   Mental Health Discussed/Reviewed Mental Health Reviewed, Coping Strategies, Other  [discussed pain management coping, encouraged to take medications prior to any therapy for preparation, Discussed ice/heat. pain creams usage]  Nutrition Interventions   Nutrition Discussed/Reviewed Nutrition Discussed, Fluid intake  Pharmacy Interventions   Pharmacy Dicussed/Reviewed Pharmacy Topics Reviewed, Medications and their functions  Safety Interventions   Safety Discussed/Reviewed  Safety Discussed, Home Safety  Home Safety Assistive Devices, Need for home safety assessment, Contact home health agency              SDOH assessments and interventions completed:  No     Care Coordination Interventions Activated:  Yes   Care Coordination Interventions:  Yes, provided   Follow up plan: Follow up call scheduled for 01/24/23    Multidisciplinary Team Attendees:   Edd Arbour, Demetrios Loll, Danford Bad  Scribe for Multidisciplinary Case Review:  Freddi Starr. [name], RN, BSN, CCM [facility] Care Management Community Coordinator Office number [phone]

## 2023-01-17 NOTE — Patient Instructions (Addendum)
Visit Information  Thank you for taking time to visit with me today. Please don't hesitate to contact me if I can be of assistance to you.   Following are the goals we discussed today:   Goals Addressed             This Visit's Progress    [facility] care coordination services (home health bath aide)   On track    Interventions Today    Flowsheet Row Most Recent Value  Chronic Disease   Chronic disease during today's visit Other  [[facility] bath aide]  General Interventions   General Interventions Discussed/Reviewed General Interventions Reviewed, Communication with  Communication with --  [THn  multidisciplinary team discussion, outreach to [facility] to confirm the plan has not changed. The patient will receive OT services first and then the aide will be with OT to help with patient bath]  Exercise Interventions   Exercise Discussed/Reviewed Exercise Reviewed, Physical Activity  [updated patient on the call to [facility]. Confirmed 1159 she did receive a call from the OT about visiting around 1200 01/17/23 Discussed OT & aide tasks with patient. Discussed the importance of OT for activity of daily living assessment before the aide]  Education Interventions   Education Provided Provided Education  Provided Verbal Education On Medication, Other, Mental Health/Coping with Illness  Mental Health Interventions   Mental Health Discussed/Reviewed Mental Health Reviewed, Coping Strategies, Other  [discussed pain management coping, encouraged to take medications prior to any therapy for preparation, Discussed ice/heat. pain creams usage]  Nutrition Interventions   Nutrition Discussed/Reviewed Nutrition Discussed, Fluid intake  Pharmacy Interventions   Pharmacy Dicussed/Reviewed Pharmacy Topics Reviewed, Medications and their functions  Safety Interventions   Safety Discussed/Reviewed Safety Discussed, Home Safety  Home Safety Assistive Devices, Need for home safety assessment, Contact home health agency               Our next appointment is by telephone on 01/24/23 at 1015  Please call the care guide team at [phone] if you need to cancel or reschedule your appointment.   If you are experiencing a Mental Health or Behavioral Health Crisis or need someone to talk to, please call the Suicide and Crisis Lifeline: 988 call the [place] National Suicide Prevention Lifeline: 201-484-2604 or TTY: 703-083-6630 TTY (630) 388-9514) to talk to a trained counselor call 1-800-273-TALK (toll free, 24 hour hotline) call the [facility]: [phone] call 911   Patient verbalizes understanding of instructions and care plan provided today and agrees to view in MyChart. Active MyChart status and patient understanding of how to access instructions and care plan via MyChart confirmed with patient.     The patient has been provided with contact information for the care management team and has been advised to call with any health related questions or concerns.   Cigi Bega L. [name], RN, BSN, CCM [facility] Care Management Community Coordinator Office number [phone]

## 2023-01-17 NOTE — Patient Outreach (Signed)
  Care Coordination   Follow Up Visit Note   01/17/2023 Name: Kathleen [name] MRN: [id number] DOB: Aug 27, 1961  Kathleen [name] is a 62 y.o. year old female who sees Luking, Jonna Coup, MD for primary care. I spoke with  Kathleen [name] by phone today.  What matters to the patients health and wellness today?  Home health OT, bath aide collaboration with [facility] staff, discussed during [facility] pod multidisciplinary discussion     Goals Addressed             This Visit's Progress    [facility] care coordination services (home health bath aide)   On track    Interventions Today    Flowsheet Row Most Recent Value  Chronic Disease   Chronic disease during today's visit Other  [[facility] bath aide]  General Interventions   General Interventions Discussed/Reviewed General Interventions Reviewed, Communication with  Communication with --  [THn  multidisciplinary team discussion, outreach to [facility] to confirm the plan has not changed. The patient will receive OT services first and then the aide will be with OT to help with patient bath]  Exercise Interventions   Exercise Discussed/Reviewed Exercise Reviewed, Physical Activity  [updated patient on the call to [facility]. Confirmed 1159 she did receive a call from the OT about visiting around 1200 01/17/23 Discussed OT & aide tasks with patient. Discussed the importance of OT for activity of daily living assessment before the aide]  Education Interventions   Education Provided Provided Education  Provided Verbal Education On Medication, Other, Mental Health/Coping with Illness  Mental Health Interventions   Mental Health Discussed/Reviewed Mental Health Reviewed, Coping Strategies, Other  [discussed pain management coping, encouraged to take medications prior to any therapy for preparation, Discussed ice/heat. pain creams usage]  Nutrition Interventions   Nutrition Discussed/Reviewed Nutrition Discussed, Fluid intake  Pharmacy Interventions   Pharmacy Dicussed/Reviewed  Pharmacy Topics Reviewed, Medications and their functions  Safety Interventions   Safety Discussed/Reviewed Safety Discussed, Home Safety  Home Safety Assistive Devices, Need for home safety assessment, Contact home health agency              SDOH assessments and interventions completed:  No     Care Coordination Interventions:  Yes, provided   Follow up plan: Follow up call scheduled for 01/24/23    Encounter Outcome:  Pt. Visit Completed   Berklie Dethlefs L. [name], RN, BSN, CCM [facility] Care Management Community Coordinator Office number [phone]

## 2023-01-17 NOTE — Telephone Encounter (Signed)
Message and provider recommendations sent to patient via my chart

## 2023-01-18 ENCOUNTER — Other Ambulatory Visit (HOSPITAL_COMMUNITY): Payer: Self-pay

## 2023-01-18 ENCOUNTER — Other Ambulatory Visit: Payer: Self-pay

## 2023-01-22 ENCOUNTER — Other Ambulatory Visit (HOSPITAL_COMMUNITY): Payer: Self-pay

## 2023-01-24 ENCOUNTER — Ambulatory Visit: Payer: Self-pay | Admitting: *Deleted

## 2023-01-24 DIAGNOSIS — M6281 Muscle weakness (generalized): Secondary | ICD-10-CM | POA: Diagnosis not present

## 2023-01-24 DIAGNOSIS — J849 Interstitial pulmonary disease, unspecified: Secondary | ICD-10-CM | POA: Diagnosis not present

## 2023-01-24 DIAGNOSIS — D860 Sarcoidosis of lung: Secondary | ICD-10-CM | POA: Diagnosis not present

## 2023-01-24 DIAGNOSIS — J9601 Acute respiratory failure with hypoxia: Secondary | ICD-10-CM | POA: Diagnosis not present

## 2023-01-24 NOTE — Patient Outreach (Signed)
  Care Coordination   01/24/2023 Name: Kathleen [name] MRN: [id number] DOB: 1961/01/07   Care Coordination Outreach Attempts:  An unsuccessful telephone outreach was attempted today to offer the patient information about available care coordination services.  Follow Up Plan:  Additional outreach attempts will be made to offer the patient care coordination information and services.   Encounter Outcome:  No Answer   Care Coordination Interventions:  No, not indicated    Chevie Birkhead L. [name], RN, BSN, CCM [facility] Care Management Community Coordinator Office number [phone]

## 2023-01-25 ENCOUNTER — Telehealth: Payer: Self-pay

## 2023-01-25 NOTE — Telephone Encounter (Signed)
Ben calling from [place] placed her PT on holf supposed to follow up with orthopedic Dr Leslie Andrea before they start PT

## 2023-01-27 NOTE — Telephone Encounter (Signed)
So noted 

## 2023-01-28 ENCOUNTER — Other Ambulatory Visit (HOSPITAL_COMMUNITY): Payer: Self-pay

## 2023-01-30 ENCOUNTER — Encounter: Payer: Self-pay | Admitting: Orthopaedic Surgery

## 2023-01-30 ENCOUNTER — Other Ambulatory Visit (INDEPENDENT_AMBULATORY_CARE_PROVIDER_SITE_OTHER): Payer: HMO

## 2023-01-30 ENCOUNTER — Ambulatory Visit (INDEPENDENT_AMBULATORY_CARE_PROVIDER_SITE_OTHER): Payer: HMO | Admitting: Orthopaedic Surgery

## 2023-01-30 VITALS — Ht 68.0 in | Wt 261.0 lb

## 2023-01-30 DIAGNOSIS — S32009D Unspecified fracture of unspecified lumbar vertebra, subsequent encounter for fracture with routine healing: Secondary | ICD-10-CM | POA: Diagnosis not present

## 2023-01-30 NOTE — Progress Notes (Signed)
[email or web address]  Office Visit Note   Patient: Kathleen [name]           Date of Birth: 11-02-1960           MRN: [id number] Visit Date: 01/30/2023              Requested by: [name], MD [address] [place],  [place] [address] PCP: [name], MD   Assessment & Plan: Visit Diagnoses:  1. Closed fracture of transverse process of lumbar vertebra with routine healing, subsequent encounter     Plan: Patient tends to get up do some walking with her walker.  She needs to avoid chairs that have low seats.  She should not get up and walk without using her walker.  Chair should also have arms.  She needs to go home and use her nebulizer which she did not use yet this morning.  She can follow-up with me in 6 weeks.  Follow-Up Instructions: Return in about 6 weeks (around 03/13/2023).   Orders:  Orders Placed This Encounter  Procedures   XR Lumbar Spine 2-3 Views   No orders of the defined types were placed in this encounter.     Procedures: No procedures performed   Clinical Data: No additional findings.   Subjective: Chief Complaint  Patient presents with   Lower Back - Fracture, Follow-up    Fall 12/18/2022    HPI follow-up fall with right L2 transverse process fracture.  She can flex her hip today she is on chronic oxygen in the wheelchair and is wheezing today.  She is on 120 oxycodone 10 mg tablets monthly and recently started some OxyContin 40 mg #60 that she received on 01/09/2023.  She has complained of significant pain.  CT scan showed no compression fractures.  Again we had the same discussion as last visit asking whether therapy will fix her problem, again she asked whether a brace will fix her problem. Patient was post be walking at home but has basically been sitting. Review of Systems updated unchanged   Objective: Vital Signs: Ht 5\' 8"  (1.727 m)   Wt 261 lb (118.4 kg)   LMP 05/17/2016   BMI 39.68 kg/m   Physical Exam patient in wheelchair wheezing  no significant pedal edema.  She is able to flex both right and left hip but has to be discouraged from using her hands to lift her thigh.  She complains of some numbness anteriorly on the left thigh.  Ortho Exam  Specialty Comments:  No specialty comments available.  Imaging: No results found.   PMFS History: Patient Active Problem List   Diagnosis Date Noted   Lumbar transverse process fracture (HCC) 01/02/2023   Acute on chronic hypoxic respiratory failure (HCC) 12/13/2022   Depression, major, single episode, moderate (HCC) 09/10/2022   Aortic atherosclerosis (HCC) 09/10/2022   Chronic respiratory failure with hypoxia (HCC) 05/22/2021   Anxiety and depression 05/22/2021   Vitamin D deficiency 05/09/2021   Left foot drop 01/09/2021   Pulmonary HTN (HCC) 10/12/2020   Encounter for long-term opiate analgesic use 02/02/2020   Interstitial lung disease (HCC) 04/14/2019   GERD (gastroesophageal reflux disease) 10/15/2017   Personal history of noncompliance with medical treatment, presenting hazards to health 04/26/2017   Hypothyroidism 01/15/2017   Adrenal insufficiency (Addison's disease) (HCC) 08/22/2016   Symptomatic bradycardia    Hypokalemia 08/17/2016   Chronic pain syndrome 04/20/2016   Irritable bowel syndrome with constipation 11/14/2015   Avascular necrosis of  bone of hip, left (HCC) 04/05/2015   Post herpetic neuralgia 10/22/2014   Mixed hyperlipidemia 08/25/2014   Obstructive sleep apnea 02/18/2014   Class 2 severe obesity due to excess calories with serious comorbidity and body mass index (BMI) of 38.0 to 38.9 in adult [facility]) 05/06/2013   Fibromyalgia 02/11/2013   Hypotension 01/16/2013   Fibrocystic breast disease in female 04/09/2012   Sarcoidosis 06/21/2008   Past Medical History:  Diagnosis Date   Adrenal insufficiency (HCC)    Anemia    Anxiety    Avascular bone necrosis (HCC)    Breast fibrocystic disorder    Chronic pain    COPD (chronic obstructive  pulmonary disease) (HCC)    Encounter for long-term opiate analgesic use 02/02/2020   Patient has chronic pain with femoral head avascular necrosis.  Is under pain contract   Fibromyalgia    Gastroesophageal reflux disease    Hypothyroidism    Mitral valve prolapse    Orthostatic hypotension    Peripheral neuropathy    PONV (postoperative nausea and vomiting)    Restrictive lung disease    Moderate to severe   Sarcoidosis    Biopsy proven - [facility]   SOB (shortness of breath)    chronic    Family History  Problem Relation Age of Onset   Cardiomyopathy Mother    Breast cancer Mother    Prostate cancer Father    Heart attack Maternal Grandmother    Heart attack Maternal Grandfather    Heart attack Paternal Grandmother    Bipolar disorder Daughter    Heart disease Maternal Aunt    Neurofibromatosis Cousin    Colon cancer Neg Hx     Past Surgical History:  Procedure Laterality Date   BIOPSY  10/17/2018   Procedure: BIOPSY;  Surgeon: Malissa Hippo, MD;  [facility];  Service: Endoscopy;;  duodenum, antrum, gastric body   BIOPSY  03/03/2021   Procedure: BIOPSY;  Surgeon: Malissa Hippo, MD;  [facility]: [facility];  Service: Endoscopy;;   BREAST LUMPECTOMY Bilateral 01/12/2011   CHOLECYSTECTOMY N/A 04/17/2021   Procedure: LAPAROSCOPIC CHOLECYSTECTOMY;  Surgeon: Franky Macho, MD;  Location: [facility];  Service: General;  Laterality: N/A;   COLONOSCOPY WITH PROPOFOL N/A 01/03/2022   Procedure: COLONOSCOPY WITH PROPOFOL;  Surgeon: Malissa Hippo, MD;  [facility];  Service: Endoscopy;  Laterality: N/A;  220   ESOPHAGEAL DILATION N/A 03/03/2021   Procedure: ESOPHAGEAL DILATION;  Surgeon: Malissa Hippo, MD;  [facility];  Service: Endoscopy;  Laterality: N/A;   ESOPHAGOGASTRODUODENOSCOPY (EGD) WITH PROPOFOL N/A 10/17/2018   Procedure: ESOPHAGOGASTRODUODENOSCOPY (EGD) WITH PROPOFOL;  Surgeon: Malissa Hippo, MD;  [facility];   Service: Endoscopy;  Laterality: N/A;  2:25   ESOPHAGOGASTRODUODENOSCOPY (EGD) WITH PROPOFOL N/A 03/03/2021   Procedure: ESOPHAGOGASTRODUODENOSCOPY (EGD) WITH PROPOFOL;  Surgeon: Malissa Hippo, MD;  [facility];  Service: Endoscopy;  Laterality: N/A;  12:15   POLYPECTOMY  01/03/2022   Procedure: POLYPECTOMY INTESTINAL;  Surgeon: Malissa Hippo, MD;  [facility];  Service: Endoscopy;;   TUBAL LIGATION     Social History   Occupational History   Occupation: Educational psychologist: [employer]  Tobacco Use   Smoking status: Never    Passive exposure: Never   Smokeless tobacco: Never  Vaping Use   Vaping Use: Never used  Substance and Sexual Activity   Alcohol use: No   Drug use: No  Sexual activity: Yes    Birth control/protection: Post-menopausal

## 2023-02-01 ENCOUNTER — Telehealth: Payer: Self-pay

## 2023-02-01 NOTE — Telephone Encounter (Signed)
Ben at [facility] requesting to discharge PT at the time pt is in so much pain and they are recommending that she follow up with [facility] and Dr Yates and give time to heal before PT.   Ben [phone] 

## 2023-02-01 NOTE — Telephone Encounter (Signed)
Ben at [facility] requesting to discharge PT at the time pt is in so much pain and they are recommending that she follow up with [facility] and Dr Ophelia Charter and give time to heal before PT.   [employer] [phone]

## 2023-02-01 NOTE — Telephone Encounter (Signed)
May discharge physical therapy at this time

## 2023-02-02 DIAGNOSIS — S32009D Unspecified fracture of unspecified lumbar vertebra, subsequent encounter for fracture with routine healing: Secondary | ICD-10-CM | POA: Diagnosis not present

## 2023-02-02 DIAGNOSIS — Z9981 Dependence on supplemental oxygen: Secondary | ICD-10-CM | POA: Diagnosis not present

## 2023-02-02 DIAGNOSIS — I4891 Unspecified atrial fibrillation: Secondary | ICD-10-CM | POA: Diagnosis not present

## 2023-02-02 DIAGNOSIS — E271 Primary adrenocortical insufficiency: Secondary | ICD-10-CM | POA: Diagnosis not present

## 2023-02-02 DIAGNOSIS — Z9181 History of falling: Secondary | ICD-10-CM | POA: Diagnosis not present

## 2023-02-02 DIAGNOSIS — D869 Sarcoidosis, unspecified: Secondary | ICD-10-CM | POA: Diagnosis not present

## 2023-02-02 DIAGNOSIS — J8410 Pulmonary fibrosis, unspecified: Secondary | ICD-10-CM | POA: Diagnosis not present

## 2023-02-02 DIAGNOSIS — Z7952 Long term (current) use of systemic steroids: Secondary | ICD-10-CM | POA: Diagnosis not present

## 2023-02-04 NOTE — Telephone Encounter (Signed)
Orders given to [facility] with Frances Furbish PT to dc at this time.

## 2023-02-08 ENCOUNTER — Telehealth: Payer: Self-pay | Admitting: Family Medicine

## 2023-02-08 ENCOUNTER — Other Ambulatory Visit (HOSPITAL_COMMUNITY): Payer: Self-pay

## 2023-02-08 DIAGNOSIS — G894 Chronic pain syndrome: Secondary | ICD-10-CM | POA: Diagnosis not present

## 2023-02-08 DIAGNOSIS — Z79899 Other long term (current) drug therapy: Secondary | ICD-10-CM | POA: Diagnosis not present

## 2023-02-08 DIAGNOSIS — S32009S Unspecified fracture of unspecified lumbar vertebra, sequela: Secondary | ICD-10-CM | POA: Diagnosis not present

## 2023-02-08 DIAGNOSIS — Z6836 Body mass index (BMI) 36.0-36.9, adult: Secondary | ICD-10-CM | POA: Diagnosis not present

## 2023-02-08 MED ORDER — OXYCODONE HCL 10 MG PO TABS
10.0000 mg | ORAL_TABLET | Freq: Four times a day (QID) | ORAL | 0 refills | Status: DC
  Filled 2023-02-08: qty 28, 7d supply, fill #0

## 2023-02-08 MED ORDER — OXYCODONE HCL ER 40 MG PO T12A
40.0000 mg | EXTENDED_RELEASE_TABLET | Freq: Two times a day (BID) | ORAL | 0 refills | Status: DC
  Filled 2023-02-08: qty 14, 7d supply, fill #0

## 2023-02-08 NOTE — Telephone Encounter (Signed)
Nurse please close. Error patient changed her mind.

## 2023-02-09 ENCOUNTER — Other Ambulatory Visit (HOSPITAL_COMMUNITY): Payer: Self-pay

## 2023-02-11 ENCOUNTER — Other Ambulatory Visit (HOSPITAL_COMMUNITY): Payer: Self-pay

## 2023-02-11 MED ORDER — NALOXONE HCL 4 MG/0.1ML NA LIQD
1.0000 | NASAL | 1 refills | Status: DC | PRN
  Filled 2023-02-11: qty 2, 2d supply, fill #0

## 2023-02-12 ENCOUNTER — Ambulatory Visit: Payer: HMO | Admitting: Family Medicine

## 2023-02-12 DIAGNOSIS — R0602 Shortness of breath: Secondary | ICD-10-CM | POA: Diagnosis not present

## 2023-02-12 DIAGNOSIS — J849 Interstitial pulmonary disease, unspecified: Secondary | ICD-10-CM | POA: Diagnosis not present

## 2023-02-12 DIAGNOSIS — D860 Sarcoidosis of lung: Secondary | ICD-10-CM | POA: Diagnosis not present

## 2023-02-13 ENCOUNTER — Ambulatory Visit (INDEPENDENT_AMBULATORY_CARE_PROVIDER_SITE_OTHER): Payer: HMO | Admitting: Family Medicine

## 2023-02-13 ENCOUNTER — Telehealth: Payer: Self-pay | Admitting: Family Medicine

## 2023-02-13 ENCOUNTER — Ambulatory Visit: Payer: HMO | Admitting: Orthopaedic Surgery

## 2023-02-13 VITALS — Ht 68.0 in | Wt 261.0 lb

## 2023-02-13 DIAGNOSIS — K625 Hemorrhage of anus and rectum: Secondary | ICD-10-CM | POA: Diagnosis not present

## 2023-02-13 DIAGNOSIS — Z79891 Long term (current) use of opiate analgesic: Secondary | ICD-10-CM | POA: Diagnosis not present

## 2023-02-13 DIAGNOSIS — S32009S Unspecified fracture of unspecified lumbar vertebra, sequela: Secondary | ICD-10-CM

## 2023-02-13 MED ORDER — OXYCODONE HCL ER 40 MG PO T12A
40.0000 mg | EXTENDED_RELEASE_TABLET | Freq: Two times a day (BID) | ORAL | 0 refills | Status: DC
  Filled 2023-02-13: qty 28, 14d supply, fill #0

## 2023-02-13 MED ORDER — OXYCODONE HCL 5 MG PO TABS
5.0000 mg | ORAL_TABLET | Freq: Four times a day (QID) | ORAL | 0 refills | Status: DC | PRN
  Filled 2023-02-13 – 2023-02-15 (×2): qty 56, 14d supply, fill #0

## 2023-02-13 NOTE — Progress Notes (Signed)
Subjective:    Patient ID: Kathleen [name], female    DOB: 1960-12-30, 62 y.o.   MRN: [id number]  HPI  Patient arrives to discuss medications. Patient went to [facility] for pain management and they said she needed to get her pain meds through PCP because they do no prescribe the level of pain meds she is on for her pain Extremely difficult situation This patient has severe pain from osteonecrosis of the hips as well as a recent pedicle fracture.  Unfortunately pain has a striking level of discomfort for this patient.  She has previously been on 610 mg oxycodones per day for her hip.  Then after the pedicle fracture occurred that did not control her pain.  We had to initiate long-acting OxyContin 40 mg twice daily with the instruction to use the 10 mg for breakthrough pain but to try to limit its use. She relates that she is taking 4 of the 10 mg for breakthrough pain She also says her pain is unbearable.  She is requesting more medication to get total relief of her pain. I tried to relay to her that the goal is to try to help control her pain not to eliminate the pain and that some pain was going to happen.  I also encouraged her that she needs to move about more she pretty much is just staying in the hospital bed all day long and deconditioning.  Her weight is going up because of her other medicines.  She went and saw pain management and stated to me that the pain management pretty much evaluated her and told her that they could not help her and she needed to go back to see her primary care doctor we will get the note from pain management to review to see if there were any other details that were left out  Also try to relay to the patient that the amount of pain medicine she is on currently is approaching a concerning amount that could increase her risk of accidental overdose.  I also impressed upon her that this goes beyond our guidelines we are just trying to help her at and that it  is necessary for her to be seen by pain management.  Her husband states he controls giving her the medicine and states that it does not make her drowsy or drug feeling, he denies her being lethargic or difficulty breathing Review of Systems     Objective:   Physical Exam  General-in no acute distress Eyes-no discharge Lungs-respiratory rate normal, CTA CV-no murmurs,RRR Extremities skin warm dry no edema Neuro grossly normal Behavior normal, alert Patient with subjective discomfort in her back      Assessment & Plan:  1. Closed fracture of pedicle of lumbar vertebra, sequela Difficult situation Patient under pain management contract Does do urine drug screens PDMP is checked on a regular basis It is necessary for her to be on pain medicine in order to help control her pain We will continue the OxyContin 40 mg twice daily for now Will give a 2-week supply and see the patient back within 2 weeks Will refer to a new pain management Unfortunately we are at the mercy of pain management to accept these patients as well as manage them As for her short acting oxycodone we will cut that back to 5 mg 4 times daily with her remaining 10 mg she will cut these in half her husband and patient agreed to this  We will also  connect with her orthopedist to see if they feel there is anything else that can be done for this patient or perhaps even light physical therapy at this point  If for any reason the pain medicine causes increased fatigue tiredness sleepiness immediately cease the medicine in notify office  I did discuss with them the possibility of being a nursing home patient husband states that the patient would not make a good nursing home patient because she needs a lot of help quickly and he does not feel nursing home would help tend to that  Patient has osteonecrosis of the hip she has been on chronic pain medicine for years and despite our best efforts to keep amounts O she has been  on 10 mg maximum 6/day so therefore when she had a pedicle fracture it was not controlling her pain we started off gradually building up the dose but because of the severity had to shift over to long-acting pain medicine  Once again I am forced with the patient that pain management when they work with her will become captain of the ship when it comes to pain management she will need to follow their direction 2. Encounter for long-term opiate analgesic use Please see per above for detail  3. Rectal bleeding She does relate some intermittent blood with her stools and relates having some constipation.  She does have hemorrhoids We will check lab work depending on the lab work may need to refer to gastroenterology for further evaluation Had colonoscopy about a year ago - CBC with Differential/Platelet - Iron, TIBC and Ferritin Panel  Will see patient back in 2 weeks time

## 2023-02-13 NOTE — Telephone Encounter (Signed)
Nurses Difficult situation for the patient 1.  We need records from [facility] for their pain management evaluation 2.  Referral to a new pain management hopefully someone that accepts her insurance.  I would prefer someone in [place] if possible.  The place in [facility] does not prescribe pain meds through virtual

## 2023-02-14 ENCOUNTER — Other Ambulatory Visit: Payer: Self-pay

## 2023-02-14 ENCOUNTER — Other Ambulatory Visit (HOSPITAL_COMMUNITY): Payer: Self-pay

## 2023-02-14 DIAGNOSIS — Z79891 Long term (current) use of opiate analgesic: Secondary | ICD-10-CM

## 2023-02-15 ENCOUNTER — Other Ambulatory Visit (HOSPITAL_COMMUNITY): Payer: Self-pay

## 2023-02-15 ENCOUNTER — Encounter: Payer: Self-pay | Admitting: Family Medicine

## 2023-02-15 NOTE — Progress Notes (Signed)
Please mail to patient

## 2023-02-18 ENCOUNTER — Ambulatory Visit: Payer: Self-pay | Admitting: *Deleted

## 2023-02-18 NOTE — Patient Outreach (Addendum)
  Care Coordination   Follow Up Visit Note   02/18/2023 Name: Kathleen [name] MRN: [id number] DOB: 1961/08/13  Kathleen [name] is a 62 y.o. year old female who sees Luking, Jonna Coup, MD for primary care. I spoke with  Kathleen [name] by phone today. Initially patient did not answer but returned a cal as RN CM was leaving her a voice message    What matters to the patients health and wellness today?  "I'm in pain" Today's Vitals   02/18/23 1005  PainSc: 7    History of Closed fracture of transverse process of lumbar vertebra  States the present pain medications (oxycodone, Tylenol, atarax) used at home are "not working" She voiced understanding of the pcp letter mailed on 02/15/23 & read by this RN CM 02/18/23 related to pending pain management plan, home health physical therapy, healing time before PT & referral  States she has already been to [facility] "they could not help me" She denies any other pain clinic or neurology had been tried nor preference of pain management providers    Completing baths now on her own with the help of her husband  her home health aide last visited "about two weeks ago" Denies concerns with Activities of daily living (ADLs)    Goals Addressed             This Visit's Progress    [facility] care coordination services (pain management, bathes, home health aide)   On track    Interventions Today    Flowsheet Row Most Recent Value  Chronic Disease   Chronic disease during today's visit Other  [chronic pain]  General Interventions   General Interventions Discussed/Reviewed General Interventions Reviewed, [facility], Doctor Visits  Doctor Visits Discussed/Reviewed Doctor Visits Reviewed, PCP, Specialist  [Read the 02/15/23 letter mailed to her by her pcp related to pending pain management referral & plan. Assessed for preference of pain managment providers and home management of pain. Assessed for any other needs]  PCP/Specialist Visits Compliance  with follow-up visit  Exercise Interventions   Exercise Discussed/Reviewed Exercise Reviewed, Physical Activity, Assistive device use and maintanence  [Encouraged activity as also recommended in pcp 02/15/23 letter]  Physical Activity Discussed/Reviewed Physical Activity Reviewed, Types of exercise  [Reviewed pcp recommendations related to home health physical therapy, healing time & paih management referral]  Education Interventions   Education Provided Provided Web-based Education, Provided Education  Provided Verbal Education On Exercise, [facility], Medication, Other  [pain management]  Mental Health Interventions   Mental Health Discussed/Reviewed Mental Health Reviewed, Coping Strategies  Pharmacy Interventions   Pharmacy Dicussed/Reviewed Pharmacy Topics Reviewed, Medications and their functions  Advanced Directive Interventions   Advanced Directives Discussed/Reviewed Advanced Directives Discussed, Provided resource for acquiring and filling out documents              SDOH assessments and interventions completed:  No     Care Coordination Interventions:  Yes, provided   Follow up plan: Follow up call scheduled for 03/18/23    Encounter Outcome:  Pt. Visit Completed   Jequan Shahin L. [name], RN, BSN, CCM [facility] Care Management Community Coordinator Office number [phone]

## 2023-02-18 NOTE — Patient Instructions (Addendum)
Visit Information  Thank you for taking time to visit with me today. Please don't hesitate to contact me if I can be of assistance to you.   Following are the goals we discussed today:   Goals Addressed             This Visit's Progress    [facility] care coordination services (pain management, bathes, home health aide)   On track    Interventions Today    Flowsheet Row Most Recent Value  Chronic Disease   Chronic disease during today's visit Other  [chronic pain]  General Interventions   General Interventions Discussed/Reviewed General Interventions Reviewed, [facility], Doctor Visits  Doctor Visits Discussed/Reviewed Doctor Visits Reviewed, PCP, Specialist  [Read the 02/15/23 letter mailed to her by her pcp related to pending pain management referral & plan. Assessed for preference of pain managment providers and home management of pain. Assessed for any other needs]  PCP/Specialist Visits Compliance with follow-up visit  Exercise Interventions   Exercise Discussed/Reviewed Exercise Reviewed, Physical Activity, Assistive device use and maintanence  [Encouraged activity as also recommended in pcp 02/15/23 letter]  Physical Activity Discussed/Reviewed Physical Activity Reviewed, Types of exercise  [Reviewed pcp recommendations related to home health physical therapy, healing time & paih management referral]  Education Interventions   Education Provided Provided Web-based Education, Provided Education  Provided Verbal Education On Exercise, [facility], Medication, Other  [pain management]  Mental Health Interventions   Mental Health Discussed/Reviewed Mental Health Reviewed, Coping Strategies  Pharmacy Interventions   Pharmacy Dicussed/Reviewed Pharmacy Topics Reviewed, Medications and their functions  Advanced Directive Interventions   Advanced Directives Discussed/Reviewed Advanced Directives Discussed, Provided resource for acquiring and filling out documents               Our next appointment is by telephone on 03/18/23 at 1000  Please call the care guide team at [phone] if you need to cancel or reschedule your appointment.   If you are experiencing a Mental Health or Behavioral Health Crisis or need someone to talk to, please call the Suicide and Crisis Lifeline: 988 call the [place] National Suicide Prevention Lifeline: 819-854-9938 or TTY: 9137809915 TTY 717 501 5194) to talk to a trained counselor call 1-800-273-TALK (toll free, 24 hour hotline) call the [facility]: 617-728-9278 call 911   Patient verbalizes understanding of instructions and care plan provided today and agrees to view in MyChart. Active MyChart status and patient understanding of how to access instructions and care plan via MyChart confirmed with patient.     The patient has been provided with contact information for the care management team and has been advised to call with any health related questions or concerns.   Sylvain Hasten L. [name], RN, BSN, CCM [facility] Care Management Community Coordinator Office number [phone]

## 2023-02-25 ENCOUNTER — Ambulatory Visit (INDEPENDENT_AMBULATORY_CARE_PROVIDER_SITE_OTHER): Payer: HMO | Admitting: Family Medicine

## 2023-02-25 ENCOUNTER — Encounter: Payer: Self-pay | Admitting: Family Medicine

## 2023-02-25 ENCOUNTER — Other Ambulatory Visit (HOSPITAL_COMMUNITY)
Admission: RE | Admit: 2023-02-25 | Discharge: 2023-02-25 | Disposition: A | Discharge status: Home / Self Care | Payer: HMO | Source: Ambulatory Visit | Attending: Family Medicine | Admitting: Family Medicine

## 2023-02-25 ENCOUNTER — Other Ambulatory Visit (HOSPITAL_COMMUNITY): Payer: Self-pay

## 2023-02-25 VITALS — BP 124/79 | HR 71 | Temp 97.9°F | Ht 68.0 in

## 2023-02-25 DIAGNOSIS — R600 Localized edema: Secondary | ICD-10-CM

## 2023-02-25 DIAGNOSIS — Z79891 Long term (current) use of opiate analgesic: Secondary | ICD-10-CM

## 2023-02-25 DIAGNOSIS — J9611 Chronic respiratory failure with hypoxia: Secondary | ICD-10-CM

## 2023-02-25 LAB — D-DIMER, QUANTITATIVE: D-Dimer, Quant: 0.83 ug/mL-FEU — ABNORMAL HIGH (ref 0.00–0.50)

## 2023-02-25 LAB — BRAIN NATRIURETIC PEPTIDE: B Natriuretic Peptide: 49 pg/mL (ref 0.0–100.0)

## 2023-02-25 LAB — BASIC METABOLIC PANEL
Anion gap: 8 (ref 5–15)
BUN: 8 mg/dL (ref 6–20)
CO2: 32 mmol/L (ref 22–32)
Calcium: 9 mg/dL (ref 8.9–10.3)
Chloride: 98 mmol/L (ref 98–111)
Creatinine, Ser: 0.77 mg/dL (ref 0.44–1.00)
GFR, Estimated: >60 mL/min (ref >60)
Glucose, Bld: 109 mg/dL — ABNORMAL HIGH (ref 70–99)
Potassium: 3.7 mmol/L (ref 3.5–5.1)
Sodium: 138 mmol/L (ref 135–145)

## 2023-02-25 LAB — MAGNESIUM: Magnesium: 2.3 mg/dL (ref 1.7–2.4)

## 2023-02-25 MED ORDER — OXYCODONE HCL 5 MG PO TABS
5.0000 mg | ORAL_TABLET | Freq: Four times a day (QID) | ORAL | 0 refills | Status: DC | PRN
  Filled 2023-02-25 – 2023-03-01 (×2): qty 56, 14d supply, fill #0

## 2023-02-25 MED ORDER — OXYCODONE HCL ER 40 MG PO T12A
40.0000 mg | EXTENDED_RELEASE_TABLET | Freq: Two times a day (BID) | ORAL | 0 refills | Status: DC
  Filled 2023-02-25 – 2023-03-01 (×2): qty 28, 14d supply, fill #0

## 2023-02-25 NOTE — Progress Notes (Signed)
Subjective:    Patient ID: Kathleen [name], female    DOB: [birth year], 62 y.o.   MRN: [id number]  HPI  Patient is here for a follow  up on pain medication refills Patient is on oxygen 4 LPM , she reports no concerns at this time Patient has chronic lung with interstitial lung disease She also has a history of pleural effusion She also has a history of osteonecrosis of the hip She also has a pedicle fracture being followed by Dr. Ophelia Charter She also admittedly states she has a low tolerance for pain She denies being depressed currently Her quality of life is subpar being bedbound wheelchair-bound able to walk with a walker States keeps her medications in the safe spot  She states recently she took a trip to the beach she noticed that both ankles swell but she states she does not do much writing that she was in a dependent position for period of time She denied calf pain but she was concerned there could be other causes Swelling went down once she put her feet up She does have shortness of breath but this is constant for her and is not worse than usual  This patient was seen today for chronic pain  The medication list was reviewed and updated.   Location of Pain for which the patient has been treated with regarding narcotics: Pedicle fracture in her back as well as severe hip pain from osteonecrosis.  She has been seen by multiple orthopedics even at [facility] where her lung specialist is and none of the orthopedist want to do surgery because they are concerned about the risk of infection because of her underlying lung disease and immunosuppressive's  Onset of this pain: Present for several years   -Compliance with medication: Good compliance with medicine  - Number patient states they take daily: Patient was on oxycodone 10 mg maximum 6/day but more recently since her pedicle fracture her pain became unbearable and patient would cry out in pain with minimal movement.  Patient had  increased around oxycodone up to 7 pills/day because of poor pain control we switch that over to 40 mg OxyContin 1 twice daily and reduced her 10 mg down to 5 mg for breakthrough pain no greater than 4/day She has been on this regimen for the past 4 weeks and has not had any drowsiness.  She states that the pain she is having is minimally better than where it was in regards to her back.  She does state that the medicine does help her move about better and without the medicine she would have a hard time ambulating with a walker.  -Reason for ongoing use of opioids please see per above  What other measures have been tried outside of opioids Tylenol, stretches, physical activity-we have avoided NSAIDs because of risk of ulcers and chronic prednisone use  In the ongoing specialists regarding this condition she sees endocrinology for Addison's, she sees Dr. Dudley Major for her lung condition, she sees Dr. Ophelia Charter for the back fracture. It should be noted that Dr. Ophelia Charter related that her back will gradually get better over time he did not recommend physical therapy he did recommend general physical stretches that the patient can do at home along with ambulation with a walker  -when was the last dose patient took?  Patient relates compliance with her medicine She is not drowsy Her husband is with her today who states that he administers the medicines They clearly understand that OxyContin  is every 12 hours where the 5 mg is for breakthrough pain maximum 4/day They bring in the bottles for pill count and they are accurate.  The patient was advised the importance of maintaining medication and not using illegal substances with these.  Here for refills and follow up  The patient was educated that we can provide 3 monthly scripts for their medication, it is their responsibility to follow the instructions.  Side effects or complications from medications: Denies side effects  Patient is aware that pain medications  are meant to minimize the severity of the pain to allow their pain levels to improve to allow for better function. They are aware of that pain medications cannot totally remove their pain.  Due for UDT ( at least once per year) (pain management contract is also completed at the time of the UDT): May 2024  Scale of 1 to 10 ( 1 is least 10 is most) Your pain level without the medicine: 9 Your pain level with medication 7  Scale 1 to 10 ( 1-helps very little, 10 helps very well) How well does your pain medication reduce your pain so you can function better through out the day? 7  Quality of the pain: Her back pain is sharp stabbing pains with movement her hip pain is dull aching pain  Persistence of the pain: Present all the time  Modifying factors: Worse with activity and movement      Review of Systems     Objective:   Physical Exam General-in no acute distress Eyes-no discharge Lungs-respiratory rate normal, CTA CV-no murmurs,RRR Extremities skin warm dry minimal pedal edema no calf swelling or tenderness Neuro grossly normal Behavior normal, alert        Assessment & Plan:   1. Pedal edema There is some edema in the feet no tenderness in the calf we will do a D-dimer depending on the results may need to move forward with further testing patient is present in the office late today ultrasound techs have already left for the day.  If D-dimer elevated will pursue ultrasound tomorrow morning.  Patient appears clinically stable - D-dimer, quantitative - Brain natriuretic peptide - Basic Metabolic Panel - Magnesium  2. Encounter for long-term opiate analgesic use Patient with chronic pain in her hips as well as her back.  I have informed the patient that the amount of pain medicine she is on currently is not something I would recommend long-term.  As her back heals up she will need to taper down on the pain medicine.  Also because the amount of pain medicine is beyond our  threshold she does need referral to pain management.  We are trying to get her an appointment at [facility] pain management.  Previously she had an appointment with [facility].  Apparently they recommended a different pain medicine and the patient was apprehensive about making any switches and so she followed back up with [place].  I have politely informed Zailee that the current level of pain medicine she is on is not something we can prescribe long-term.  It is also best for her to be under the management of the pain medicine specialist.  And when she sees them she will need to be open to following their direction in regards to treatment  I also tried to reiterate to her that it is unreasonable to expect total pain resolution with her hip pain with her back instead utilization of pain medicine is used to help take the  edge off the pain to allow her to be more functional  3. Chronic respiratory failure with hypoxia (HCC) She is not on any type of severe distress today she is on her oxygen O2 sat okay  Post note-her D-dimer came back elevated we will pursue stat ultrasounds on Tuesday-I spoke with her by phone.  I told her if she has any critical symptoms during the night such as severe shortness of breath to go to the ER but otherwise we will pursue all this tomorrow

## 2023-02-25 NOTE — Telephone Encounter (Signed)
I was able to talk with the patient-I told her she will need to be seen by pain management-I gave her the phone number to [place]-she will call them this week to help set up her appointment-she will let us know when that appointment is-she is also aware that we will not be prescribing pain medicines long-term at this level

## 2023-02-26 ENCOUNTER — Ambulatory Visit (HOSPITAL_COMMUNITY): Admission: RE | Admit: 2023-02-26 | Payer: HMO | Source: Ambulatory Visit

## 2023-02-26 ENCOUNTER — Emergency Department (HOSPITAL_COMMUNITY)
Admission: EM | Admit: 2023-02-26 | Discharge: 2023-02-26 | Disposition: A | Discharge status: Home / Self Care | Payer: HMO | Attending: Emergency Medicine | Admitting: Emergency Medicine

## 2023-02-26 ENCOUNTER — Other Ambulatory Visit (HOSPITAL_COMMUNITY): Payer: Self-pay

## 2023-02-26 ENCOUNTER — Telehealth: Payer: Self-pay | Admitting: Family Medicine

## 2023-02-26 ENCOUNTER — Encounter (HOSPITAL_COMMUNITY): Payer: Self-pay | Admitting: *Deleted

## 2023-02-26 ENCOUNTER — Ambulatory Visit (HOSPITAL_COMMUNITY)
Admission: RE | Admit: 2023-02-26 | Discharge: 2023-02-26 | Disposition: A | Discharge status: Home / Self Care | Payer: HMO | Source: Ambulatory Visit | Attending: Family Medicine | Admitting: Family Medicine

## 2023-02-26 ENCOUNTER — Emergency Department (HOSPITAL_COMMUNITY): Payer: HMO

## 2023-02-26 ENCOUNTER — Other Ambulatory Visit: Payer: Self-pay

## 2023-02-26 ENCOUNTER — Other Ambulatory Visit: Payer: Self-pay | Admitting: Family Medicine

## 2023-02-26 ENCOUNTER — Other Ambulatory Visit: Payer: Self-pay | Admitting: *Deleted

## 2023-02-26 DIAGNOSIS — J449 Chronic obstructive pulmonary disease, unspecified: Secondary | ICD-10-CM | POA: Diagnosis not present

## 2023-02-26 DIAGNOSIS — X58XXXA Exposure to other specified factors, initial encounter: Secondary | ICD-10-CM | POA: Diagnosis not present

## 2023-02-26 DIAGNOSIS — M79604 Pain in right leg: Secondary | ICD-10-CM | POA: Diagnosis not present

## 2023-02-26 DIAGNOSIS — M79661 Pain in right lower leg: Secondary | ICD-10-CM

## 2023-02-26 DIAGNOSIS — R0609 Other forms of dyspnea: Secondary | ICD-10-CM | POA: Diagnosis not present

## 2023-02-26 DIAGNOSIS — R0602 Shortness of breath: Secondary | ICD-10-CM | POA: Diagnosis not present

## 2023-02-26 DIAGNOSIS — R7989 Other specified abnormal findings of blood chemistry: Secondary | ICD-10-CM | POA: Diagnosis not present

## 2023-02-26 DIAGNOSIS — J479 Bronchiectasis, uncomplicated: Secondary | ICD-10-CM | POA: Diagnosis not present

## 2023-02-26 DIAGNOSIS — Z9049 Acquired absence of other specified parts of digestive tract: Secondary | ICD-10-CM | POA: Diagnosis not present

## 2023-02-26 DIAGNOSIS — S22080A Wedge compression fracture of T11-T12 vertebra, initial encounter for closed fracture: Secondary | ICD-10-CM | POA: Insufficient documentation

## 2023-02-26 DIAGNOSIS — M79662 Pain in left lower leg: Secondary | ICD-10-CM | POA: Insufficient documentation

## 2023-02-26 DIAGNOSIS — E039 Hypothyroidism, unspecified: Secondary | ICD-10-CM | POA: Insufficient documentation

## 2023-02-26 DIAGNOSIS — M79605 Pain in left leg: Secondary | ICD-10-CM | POA: Diagnosis not present

## 2023-02-26 DIAGNOSIS — S299XXA Unspecified injury of thorax, initial encounter: Secondary | ICD-10-CM | POA: Diagnosis present

## 2023-02-26 MED ORDER — METHYLPREDNISOLONE SODIUM SUCC 40 MG IJ SOLR
40.0000 mg | Freq: Once | INTRAMUSCULAR | Status: AC
  Administered 2023-02-26: 40 mg via INTRAVENOUS
  Filled 2023-02-26: qty 1

## 2023-02-26 MED ORDER — HYDROXYZINE HCL 25 MG PO TABS
25.0000 mg | ORAL_TABLET | Freq: Three times a day (TID) | ORAL | 1 refills | Status: DC | PRN
  Filled 2023-02-26: qty 90, 30d supply, fill #0
  Filled 2023-03-28: qty 90, 30d supply, fill #1

## 2023-02-26 MED ORDER — DIPHENHYDRAMINE HCL 50 MG/ML IJ SOLN
50.0000 mg | Freq: Once | INTRAMUSCULAR | Status: AC
  Administered 2023-02-26: 50 mg via INTRAVENOUS
  Filled 2023-02-26: qty 1

## 2023-02-26 MED ORDER — LORAZEPAM 0.5 MG PO TABS
0.5000 mg | ORAL_TABLET | Freq: Once | ORAL | Status: AC
  Administered 2023-02-26: 0.5 mg via ORAL
  Filled 2023-02-26: qty 1

## 2023-02-26 MED ORDER — DIPHENHYDRAMINE HCL 25 MG PO CAPS: 50.0000 mg | ORAL_CAPSULE | Freq: Once | ORAL | Status: AC

## 2023-02-26 MED ORDER — IOHEXOL 350 MG/ML SOLN
75.0000 mL | Freq: Once | INTRAVENOUS | Status: AC | PRN
  Administered 2023-02-26: 75 mL via INTRAVENOUS

## 2023-02-26 MED ORDER — OXYCODONE HCL 5 MG PO TABS
5.0000 mg | ORAL_TABLET | Freq: Once | ORAL | Status: AC
  Administered 2023-02-26: 5 mg via ORAL
  Filled 2023-02-26: qty 1

## 2023-02-26 NOTE — Discharge Instructions (Addendum)
Thank you for coming to [facility] Emergency Department. You were seen for shortness of breath. We did an exam, labs, and imaging, and these showed no acute findings.  There was no clot in your lungs.  Please follow-up with your pulmonologist or your primary care physician within 1 to 2 weeks about your ongoing chronic dyspnea on exertion.  We also found a new spinal compression fracture at T11.  Please wear your TLSO brace that you have at home.  Please follow-up with your spine doctor or the doctors at [facility].  Please call them tomorrow to make an appointment.  Do not hesitate to return to the ED or call 911 if you experience: -Worsening symptoms -Chest pain -Lightheadedness, passing out - Fever (100.4 F or 38 C) or chills at home that do not respond to over the counter medications - Weakness, numbness, or tingling in your extremities - Difficulty emptying bladder / urinary incontinence - Fecal incontinence - Uncontrolled nausea/vomiting with inability to keep down liquids - Feeling as though you are going to pass out or passing out - Anything else that concerns you

## 2023-02-26 NOTE — Telephone Encounter (Signed)
This patient has a history of interstitial lung disease She is under the care of Dr. Dudley Major at [facility] She is also on prednisone She has been fairly immobile lately due to back fracture as well as osteo necrosis of the left hip  Recently she traveled to the beach and describes swelling in both legs with some slight discomfort D-dimer was positive Ultrasound of the legs was negative Patient complaining increased shortness of breath When I saw her yesterday she was clinically stable Unfortunately there is no reliable way to rule out pulmonary embolism VQ scan would likely not be helpful because of her interstitial lung disease I recommended a CT scan of the lungs but unfortunately she has a reported contrast allergy issue but it was not anaphylaxis Radiology states that there is a protocol that they can do the pretreatment but the only way to get that done today would be going through the ER to have possible pretreatment This exceeds the ability of our office to handle this issue today Patient was informed to go to the ER for further evaluation I spoke with the ER doctor regarding this.

## 2023-02-26 NOTE — ED Provider Notes (Signed)
[place] EMERGENCY DEPARTMENT AT [facility] Provider Note   CSN: [id number] Arrival date & time: 02/26/23  1506     History {Add pertinent medical, surgical, social history, OB history to HPI:1} No chief complaint on file.   Kathleen [name] is a 62 y.o. female with ILD, pulm sarcoid, addison's disease on prednisone (follows w/ Dr. Dudley Major at [facility]) presents with SOB, elevated d-dimer.   Recently she traveled to the beach and describes swelling in both legs with some slight discomfort D-dimer was positive. Ultrasound of the legs was negative. Patient complaining increased shortness of breath/dyspnea on exertion since she fell and was diagnosed w/ L2 TP fx in April 2024. No increased cough, denies hemoptysis/fevers/chills, CP. Just endorses back pain where the fracture was. Also endorses bilateral lower extremity swelling worse than normal recently. PCP Dr. Gerda Diss recommended CT scan but patient does have contrast allergy. Per Dr. Gerda Diss, she has been fairly immobile lately due to back fracture as well as osteo necrosis of the left hip. Had labs yesterday which demonstrated unremarkable BMP, negative BNP, d-dimer 0.83.    HPI     Home Medications Prior to Admission medications   Medication Sig Start Date End Date Taking? Authorizing Provider  acetaminophen (TYLENOL) 500 MG tablet Take 1,000 mg by mouth every 8 (eight) hours as needed for headache.    [provider]  albuterol (PROVENTIL) (2.5 MG/3ML) 0.083% nebulizer solution Take 3 mLs (2.5 mg total) by nebulization every 4 (four) hours as needed for wheezing or shortness of breath. 12/15/22 12/15/23  Shon Hale, MD  albuterol (VENTOLIN HFA) 108 (90 Base) MCG/ACT inhaler Inhale 2 puffs into the lungs every 6 (six) hours as needed for wheezing or shortness of breath. 12/15/22   Shon Hale, MD  ascorbic acid (VITAMIN C) 500 MG tablet Take 1 tablet (500 mg total) by mouth daily. 05/23/21   Shon Hale, MD   budesonide (PULMICORT) 0.5 MG/2ML nebulizer solution Inhale the contents of 1 vial via nebulizer two (2) times a day. 11/22/21     EPINEPHrine 0.3 mg/0.3 mL IJ SOAJ injection Inject 0.3 mg into the muscle as needed for anaphylaxis. 01/05/22   [name], MD  escitalopram (LEXAPRO) 10 MG tablet Take 1 tablet (10 mg total) by mouth daily. 01/17/23   [name], MD  ferrous sulfate 325 (65 FE) MG EC tablet Take 325 mg by mouth as needed.    [provider]  formoterol (PERFOROMIST) 20 MCG/2ML nebulizer solution Inhale the contents of 1 vial via nebulizer two (2) times a day. 12/15/22   Shon Hale, MD  formoterol (PERFOROMIST) 20 MCG/2ML nebulizer solution Inhale 1 vial via nebulizer two (2) times a day. 12/17/22     formoterol (PERFOROMIST) 20 MCG/2ML nebulizer solution Inhale into the lungs. 12/17/22 12/17/23  [provider]  hydrocortisone (CORTEF) 10 MG tablet Take 2 tablets by mouth at 8:00 am and 1 tablet at lunch. (May double dose on sick days x 3-5 days) 10/24/22   Roma Kayser, MD  hydrOXYzine (ATARAX) 25 MG tablet Take 1 tablet (25 mg total) by mouth 3 (three) times daily as needed for anxiety. 12/26/22 12/26/23  [name], MD  ipratropium-albuterol (DUONEB) 0.5-2.5 (3) MG/3ML SOLN Inhale 1 vial via nebulizer every 8 hours. 12/17/22     levothyroxine (SYNTHROID) 50 MCG tablet Take 1 tablet by mouth daily before breakfast. 10/24/22 04/22/23  Roma Kayser, MD  linaclotide (LINZESS) 290 MCG CAPS capsule Take 1 capsule (290  mcg total) by mouth daily before breakfast. 03/28/22   [name], MD  naloxone [facility]) nasal spray 4 mg/0.1 mL Use as directed for opioid overdose 12/26/22   [name], MD  naloxone [facility]) nasal spray 4 mg/0.1 mL Place 1 spray into the nose as needed for accidental overdose. 02/09/23     ondansetron (ZOFRAN-ODT) 4 MG disintegrating tablet Take 1 tablet (4 mg total) by mouth every 8 (eight) hours as needed for nausea or  vomiting. 03/02/22   Milagros Loll, MD  oxyCODONE (OXY IR/ROXICODONE) 5 MG immediate release tablet Take 1 tablet (5 mg total) by mouth up to 4 a day as needed for breakthrough pain 02/25/23   [name], MD  oxyCODONE (OXYCONTIN) 40 mg 12 hr tablet Take 1 tablet (40 mg total) by mouth every 12 (twelve) hours. 02/25/23   [name], MD  pantoprazole (PROTONIX) 40 MG tablet Take 1 tablet (40 mg total) by mouth 2 (two) times daily before a meal. 12/15/22 12/15/23  Emokpae, Courage, MD  predniSONE (DELTASONE) 10 MG tablet Take 1 tablet (10 mg total) by mouth See admin instructions. Take Prednisone 60 mg (6 tab) daily for 3 days, then 5 tab (50 mg) daily x 3 days , then Prednisone 40mg  (4 tabs) daily x 3 days, then 30mg  (3 tabs) x 3 days , then 20 mg (2 tabs) daily x 3 days, then 10mg  daily indefinitely until you see Dr. Dudley Major 12/15/22   Shon Hale, MD  predniSONE (DELTASONE) 5 MG tablet Take 1 tablet (5 mg total) by mouth in the morning. Patient taking differently: Take 10 mg by mouth in the morning. 07/04/22     rosuvastatin (CRESTOR) 10 MG tablet Take 1 tablet (10 mg total) by mouth daily. 07/03/22   [name], MD  valACYclovir (VALTREX) 1000 MG tablet Take 1 tablet (1,000 mg total) by mouth 3 (three) times daily. 01/09/23   [name], MD      Allergies    Bee venom, Contrast media [iodinated contrast media], Lactose intolerance (gi), Bactrim [sulfamethoxazole-trimethoprim], Codeine, and Morphine and codeine    Review of Systems   Review of Systems A 10 point review of systems was performed and is negative unless otherwise reported in HPI.  Physical Exam Updated Vital Signs BP (!) 134/44 (BP Location: Right Arm)   Pulse 70   Temp 98.4 F (36.9 C) (Oral)   Resp 16   Ht 5\' 8"  (1.727 m)   Wt 108.9 kg   LMP 05/17/2016   SpO2 99%   BMI 36.49 kg/m  Physical Exam General: Normal appearing female, lying in bed.  HEENT: PERRLA, Sclera anicteric, MMM, trachea midline.   Cardiology: RRR, no murmurs/rubs/gallops. BL radial and DP pulses equal bilaterally.  Resp: Normal respiratory rate and effort. CTAB, no wheezes, rhonchi, crackles.  Abd: Soft, non-tender, non-distended. No rebound tenderness or guarding.  GU: Deferred. MSK: No peripheral edema or signs of trauma. Extremities without deformity or TTP. No cyanosis or clubbing. Skin: warm, dry. No rashes or lesions. Back: No CVA tenderness Neuro: A&Ox4, CNs II-XII grossly intact. MAEs. Sensation grossly intact.  Psych: Normal mood and affect.   ED Results / Procedures / Treatments   Labs (all labs ordered are listed, but only abnormal results are displayed) Labs Reviewed - No data to display  EKG None  Radiology US Venous Img Lower Bilateral (DVT)  Result Date: 02/26/2023 CLINICAL DATA:  Bilateral lower extremity pain Elevated D-dimer EXAM: Bilateral lower Extremity  Venous Doppler Ultrasound TECHNIQUE: Gray-scale sonography with compression, as well as color and duplex ultrasound, were performed to evaluate the deep venous system(s) from the level of the common femoral vein through the popliteal and proximal calf veins. COMPARISON:  11/15/2020 FINDINGS: VENOUS Normal compressibility of the common femoral, superficial femoral, and popliteal veins, as well as the visualized calf veins. Visualized portions of profunda femoral vein and great saphenous vein unremarkable. No filling defects to suggest DVT on grayscale or color Doppler imaging. Doppler waveforms show normal direction of venous flow, normal respiratory plasticity and response to augmentation. OTHER None. Limitations: none IMPRESSION: No  lower extremity DVT. Electronically Signed   By: Acquanetta Belling M.D.   On: 02/26/2023 11:07    Procedures Procedures  {Document cardiac monitor, telemetry assessment procedure when appropriate:1}  Medications Ordered in ED Medications  diphenhydrAMINE (BENADRYL) capsule 50 mg (has no administration in time range)     Or  diphenhydrAMINE (BENADRYL) injection 50 mg (has no administration in time range)  methylPREDNISolone sodium succinate (SOLU-MEDROL) 40 mg/mL injection 40 mg (40 mg Intravenous Given 02/26/23 1614)    ED Course/ Medical Decision Making/ A&P                          Medical Decision Making Amount and/or Complexity of Data Reviewed Radiology: ordered.  Risk Prescription drug management.    This patient presents to the ED for concern of DOE, this involves an extensive number of treatment options, and is a complaint that carries with it a high risk of complications and morbidity.  I considered the following differential and admission for this acute, potentially life threatening condition.   MDM:    DDX for dyspnea includes but is not limited to:  Cardiac- CHF, Myocardial Ischemia, Valvular heart disease, Arrythmia, Cardiac tamponade   Respiratory - Pneumonia / atelectasis / pulmonary effusion / cavitary lung disease, Pneumothorax, COPD/ reactive airway disease, PE, ARDS   Other - Sepsis, Anemia     Clinical Course as of 02/26/23 1624  Tue Feb 26, 2023  1612 D/w radiologist about contrast prep in s/o Addison's disease. He stated that her daily steroid dose is small compared to the methylprednisolone IV needed for contrast prep and to move forward with prep as recommended. [HN]    Clinical Course User Index [HN] Loetta Rough, MD    Labs: I Ordered, and personally interpreted labs.  The pertinent results include:  ***  Imaging Studies ordered: I ordered imaging studies including *** I independently visualized and interpreted imaging. I agree with the radiologist interpretation  Additional history obtained from ***.  External records from outside source obtained and reviewed including ***  Cardiac Monitoring: The patient was maintained on a cardiac monitor.  I personally viewed and interpreted the cardiac monitored which showed an underlying rhythm of:  ***  Reevaluation: After the interventions noted above, I reevaluated the patient and found that they have :{resolved/improved/worsened:23923::"improved"}  Social Determinants of Health: ***  Disposition:  ***  Co morbidities that complicate the patient evaluation  Past Medical History:  Diagnosis Date   Adrenal insufficiency (HCC)    Anemia    Anxiety    Avascular bone necrosis (HCC)    Breast fibrocystic disorder    Chronic pain    COPD (chronic obstructive pulmonary disease) (HCC)    Encounter for long-term opiate analgesic use 02/02/2020   Patient has chronic pain with femoral head avascular necrosis.  Is under pain contract  Fibromyalgia    Gastroesophageal reflux disease    Hypothyroidism    Mitral valve prolapse    Orthostatic hypotension    Peripheral neuropathy    PONV (postoperative nausea and vomiting)    Restrictive lung disease    Moderate to severe   Sarcoidosis    Biopsy proven - [facility]   SOB (shortness of breath)    chronic     Medicines Meds ordered this encounter  Medications   methylPREDNISolone sodium succinate (SOLU-MEDROL) 40 mg/mL injection 40 mg    IV methylprednisolone will be converted to either a q12h or q24h frequency with the same total daily dose (TDD).  Ordered Dose: 1 to 125 mg TDD; convert to: TDD q24h.  Ordered Dose: 126 to 250 mg TDD; convert to: TDD div q12h.  Ordered Dose: >250 mg TDD; DAW.   OR Linked Order Group    diphenhydrAMINE (BENADRYL) capsule 50 mg    diphenhydrAMINE (BENADRYL) injection 50 mg    I have reviewed the patients home medicines and have made adjustments as needed  Problem List / ED Course: Problem List Items Addressed This Visit   None        {Document critical care time when appropriate:1} {Document review of labs and clinical decision tools ie heart score, Chads2Vasc2 etc:1}  {Document your independent review of radiology images, and any outside records:1} {Document your discussion with family  members, caretakers, and with consultants:1} {Document social determinants of health affecting pt's care:1} {Document your decision making why or why not admission, treatments were needed:1}  This note was created using dictation software, which may contain spelling or grammatical errors.

## 2023-02-26 NOTE — ED Triage Notes (Signed)
Pt sent here for CT of chest to r/o PE.  Pt was sent by Dr. Gerda Diss. Pt states she was having SOB.

## 2023-02-26 NOTE — ED Notes (Signed)
Pt to bathroom via wheelchair.

## 2023-03-01 ENCOUNTER — Other Ambulatory Visit (HOSPITAL_COMMUNITY): Payer: Self-pay

## 2023-03-01 ENCOUNTER — Other Ambulatory Visit: Payer: Self-pay | Admitting: Family Medicine

## 2023-03-01 ENCOUNTER — Ambulatory Visit: Payer: HMO | Admitting: Family Medicine

## 2023-03-01 MED ORDER — BUSPIRONE HCL 10 MG PO TABS
ORAL_TABLET | ORAL | 2 refills | Status: DC
  Filled 2023-03-01: qty 45, 15d supply, fill #0

## 2023-03-01 MED ORDER — CALCITONIN (SALMON) 200 UNIT/ACT NA SOLN
NASAL | 0 refills | Status: DC
  Filled 2023-03-01: qty 3.7, 30d supply, fill #0

## 2023-03-01 MED ORDER — DULOXETINE HCL 30 MG PO CPEP
ORAL_CAPSULE | ORAL | 2 refills | Status: DC
  Filled 2023-03-01: qty 30, 30d supply, fill #0

## 2023-03-01 NOTE — Progress Notes (Signed)
Because patient is having severe anxiety and feeling very overwhelmed we will try BuSpar I absolutely do not want to use benzodiazepines with her other medicines

## 2023-03-01 NOTE — Telephone Encounter (Signed)
Unfortunately there are no easy answers T11 shows compression fracture but it is unknown if that is acute or perhaps related into late April  I would recommend the following calcitonin nasal spray, 1 spray in the nostril per day, alternate nostrils every other day, may have 1 vial she should use this for at least 2 weeks but perhaps as long as 4 weeks.  Study showed that this can reduce the pain from this problem  Also Cymbalta 30 mg 1 daily, #30, 2 refills this is an antidepressant that can help with musculoskeletal pain as well Also stop Lexapro while on this medicine  As for her opioid pain medicine she is at the highest dose that I can prescribe I cannot go above her current dosing.  We are trying to get her in with the pain management but I am hopeful that the above recommendations will also help.  Most pain management places do not go higher than what we are currently prescribing but she exceeds what we can prescribe on a ongoing basis.

## 2023-03-01 NOTE — Telephone Encounter (Signed)
Patient notified and verbalized understanding. 

## 2023-03-01 NOTE — Telephone Encounter (Signed)
Cannot do benzodiazepines with pain medicine that is a very dangerous combination that can drastically increase he review herr risk of bad outcomes for the patient

## 2023-03-01 NOTE — Telephone Encounter (Signed)
Patient notified of provider's recommendations and stated she has been having problems with ongoing constant panic attacks and needs something to help when she has one. Patient stated she had on in ER and they gave her Ativan and it really helped and thyr told her it would not hurt anything is small dose. Advised patient that this type of medication is not typically recommended when patient is on long term pain medication but would sent message back

## 2023-03-02 ENCOUNTER — Other Ambulatory Visit (HOSPITAL_COMMUNITY): Payer: Self-pay

## 2023-03-04 DIAGNOSIS — R0602 Shortness of breath: Secondary | ICD-10-CM | POA: Diagnosis not present

## 2023-03-04 DIAGNOSIS — J849 Interstitial pulmonary disease, unspecified: Secondary | ICD-10-CM | POA: Diagnosis not present

## 2023-03-04 DIAGNOSIS — D860 Sarcoidosis of lung: Secondary | ICD-10-CM | POA: Diagnosis not present

## 2023-03-05 DIAGNOSIS — J849 Interstitial pulmonary disease, unspecified: Secondary | ICD-10-CM | POA: Diagnosis not present

## 2023-03-07 ENCOUNTER — Ambulatory Visit: Payer: HMO | Admitting: Orthopaedic Surgery

## 2023-03-07 ENCOUNTER — Ambulatory Visit (INDEPENDENT_AMBULATORY_CARE_PROVIDER_SITE_OTHER): Payer: HMO | Admitting: Family Medicine

## 2023-03-07 ENCOUNTER — Other Ambulatory Visit: Payer: Self-pay | Admitting: *Deleted

## 2023-03-07 ENCOUNTER — Other Ambulatory Visit (HOSPITAL_COMMUNITY): Payer: Self-pay

## 2023-03-07 VITALS — BP 116/72 | HR 94 | Temp 98.1°F | Ht 68.0 in

## 2023-03-07 DIAGNOSIS — S22080G Wedge compression fracture of T11-T12 vertebra, subsequent encounter for fracture with delayed healing: Secondary | ICD-10-CM | POA: Diagnosis not present

## 2023-03-07 DIAGNOSIS — F411 Generalized anxiety disorder: Secondary | ICD-10-CM | POA: Diagnosis not present

## 2023-03-07 MED ORDER — BUSPIRONE HCL 15 MG PO TABS
15.0000 mg | ORAL_TABLET | Freq: Three times a day (TID) | ORAL | 4 refills | Status: DC
  Filled 2023-03-07: qty 90, 30d supply, fill #0
  Filled 2023-04-19: qty 90, 30d supply, fill #1

## 2023-03-07 MED ORDER — OXYCODONE HCL ER 40 MG PO T12A
40.0000 mg | EXTENDED_RELEASE_TABLET | Freq: Two times a day (BID) | ORAL | 0 refills | Status: DC
  Filled 2023-03-07: qty 28, 14d supply, fill #0

## 2023-03-07 MED ORDER — OXYCODONE HCL 5 MG PO TABS
5.0000 mg | ORAL_TABLET | Freq: Four times a day (QID) | ORAL | 0 refills | Status: DC | PRN
  Filled 2023-03-07: qty 56, 14d supply, fill #0

## 2023-03-07 MED ORDER — DULOXETINE HCL 60 MG PO CPEP
60.0000 mg | ORAL_CAPSULE | Freq: Every day | ORAL | 3 refills | Status: DC
  Filled 2023-03-07: qty 30, 30d supply, fill #0
  Filled 2023-03-28: qty 30, 30d supply, fill #1
  Filled 2023-04-19 – 2023-04-22 (×2): qty 30, 30d supply, fill #2

## 2023-03-07 NOTE — Progress Notes (Signed)
   Subjective:    Patient ID: Kathleen [name], female    DOB: Sep 15, 1960, 62 y.o.   MRN: [id number]  HPI Pt is here today as a follow up for edema. Very difficult situation Patient is very nice but she has a low tolerance of pain She has osteonecrosis of the left hip she has been on oxycodone 10 mg maximum 6/day for several years She has underlying chronic lung issue for which she cannot come off of her immunosuppressant and because of that orthopedics will not do surgery on her hip She typically gets around with a walker Recently she took a fall she broke a pedicle and she ended up on a scooter and increase severe pain because of this we had to go up on the dose of her pain medicine we ended up starting off with long-acting oxycodone and gradually increase that to 40 mg twice daily with 5 mg tablets for breakthrough pain no more than 4/day Despite all this she still has increased pain and discomfort  Her current pain dosing exceeds our guidelines.  We will not be going up on her pain medicine She also has significant troubles with anxiety.  We have worked with her with Cymbalta increasing it from 30 mg to currently today 60 mg we also worked with her regarding BuSpar and increasing the dose of that as well Despite all of that she continues to have a lot of anxiety-we have recommended counseling and seeing a psychiatrist patient states that "that does not help "  I have impressed upon her that medications can only do so much and she needs to exert some level of control over her emotions and how she is perceiving all of this-we have tried to do that in the kindest way possible  No questions or concerns at this time.   Review of Systems     Objective:   Physical Exam  General-in no acute distress Eyes-no discharge Lungs-respiratory rate normal, CTA CV-no murmurs,RRR Extremities skin warm dry no edema Neuro grossly normal Behavior normal, alert Subjective discomfort low back mid back  increased pain with movement rotation      Assessment & Plan:  Severe midthoracic pain recent CT showed possible T11 compression fracture unknown if this is acute or old recommend MRI I counseled the patient on the need to start decreasing pain medicine we will maintain where we are at for 2 more weeks then we will start decreasing Patient has a low tolerance for pain She also suffers with a lot of anxiety She has seen psychiatry and counselors in the past states that does not help She relates that she has hard time getting out because of poor transportation aspects She understands that her higher dose of pain medicines we cannot use benzodiazepines at all We will go with BuSpar for her anxiety and go with increased Cymbalta We are trying to get her in with pain management The first pain management-[facility]-she went to she deferred on changing medicines-she states she was scared to make any changes She now understands that she has to follow the direction of the pain management office We are trying to get her in with [facility] pain [facility] Unfortunately many pain management centers are hard to get into We will continue to try to advocate for the patient to be seen with pain management.  We are essentially at the mercy of the pain management centers on whether or not they can help [place] out with this patient.

## 2023-03-08 ENCOUNTER — Other Ambulatory Visit (HOSPITAL_COMMUNITY): Payer: Self-pay

## 2023-03-10 ENCOUNTER — Inpatient Hospital Stay (HOSPITAL_COMMUNITY): Payer: HMO | Admitting: Registered Nurse

## 2023-03-10 ENCOUNTER — Emergency Department (HOSPITAL_COMMUNITY): Payer: HMO

## 2023-03-10 ENCOUNTER — Other Ambulatory Visit: Payer: Self-pay

## 2023-03-10 ENCOUNTER — Inpatient Hospital Stay (HOSPITAL_COMMUNITY)
Admission: EM | Admit: 2023-03-10 | Discharge: 2023-03-14 | DRG: 493 | Disposition: A | Discharge status: Skilled Nursing Facility | Payer: HMO | Attending: Student | Admitting: Student

## 2023-03-10 ENCOUNTER — Encounter (HOSPITAL_COMMUNITY)
Admission: EM | Disposition: A | Discharge status: Skilled Nursing Facility | Payer: Self-pay | Source: Home / Self Care | Attending: Internal Medicine

## 2023-03-10 ENCOUNTER — Inpatient Hospital Stay (HOSPITAL_COMMUNITY): Payer: HMO

## 2023-03-10 ENCOUNTER — Encounter (HOSPITAL_COMMUNITY): Payer: Self-pay | Admitting: Emergency Medicine

## 2023-03-10 DIAGNOSIS — S9305XA Dislocation of left ankle joint, initial encounter: Secondary | ICD-10-CM | POA: Diagnosis present

## 2023-03-10 DIAGNOSIS — S82402A Unspecified fracture of shaft of left fibula, initial encounter for closed fracture: Secondary | ICD-10-CM | POA: Diagnosis not present

## 2023-03-10 DIAGNOSIS — M85872 Other specified disorders of bone density and structure, left ankle and foot: Secondary | ICD-10-CM | POA: Diagnosis not present

## 2023-03-10 DIAGNOSIS — S82842S Displaced bimalleolar fracture of left lower leg, sequela: Secondary | ICD-10-CM | POA: Diagnosis not present

## 2023-03-10 DIAGNOSIS — W06XXXA Fall from bed, initial encounter: Secondary | ICD-10-CM | POA: Diagnosis present

## 2023-03-10 DIAGNOSIS — S93432A Sprain of tibiofibular ligament of left ankle, initial encounter: Secondary | ICD-10-CM

## 2023-03-10 DIAGNOSIS — G629 Polyneuropathy, unspecified: Secondary | ICD-10-CM | POA: Diagnosis present

## 2023-03-10 DIAGNOSIS — Z743 Need for continuous supervision: Secondary | ICD-10-CM | POA: Diagnosis not present

## 2023-03-10 DIAGNOSIS — Z803 Family history of malignant neoplasm of breast: Secondary | ICD-10-CM

## 2023-03-10 DIAGNOSIS — Y92009 Unspecified place in unspecified non-institutional (private) residence as the place of occurrence of the external cause: Secondary | ICD-10-CM

## 2023-03-10 DIAGNOSIS — D869 Sarcoidosis, unspecified: Secondary | ICD-10-CM | POA: Diagnosis present

## 2023-03-10 DIAGNOSIS — R140 Abdominal distension (gaseous): Secondary | ICD-10-CM | POA: Diagnosis not present

## 2023-03-10 DIAGNOSIS — M797 Fibromyalgia: Secondary | ICD-10-CM | POA: Diagnosis not present

## 2023-03-10 DIAGNOSIS — W19XXXA Unspecified fall, initial encounter: Secondary | ICD-10-CM | POA: Diagnosis not present

## 2023-03-10 DIAGNOSIS — Z043 Encounter for examination and observation following other accident: Secondary | ICD-10-CM | POA: Diagnosis not present

## 2023-03-10 DIAGNOSIS — Z885 Allergy status to narcotic agent status: Secondary | ICD-10-CM

## 2023-03-10 DIAGNOSIS — Z9889 Other specified postprocedural states: Secondary | ICD-10-CM | POA: Diagnosis not present

## 2023-03-10 DIAGNOSIS — J479 Bronchiectasis, uncomplicated: Secondary | ICD-10-CM | POA: Diagnosis present

## 2023-03-10 DIAGNOSIS — J849 Interstitial pulmonary disease, unspecified: Secondary | ICD-10-CM | POA: Diagnosis not present

## 2023-03-10 DIAGNOSIS — Z91030 Bee allergy status: Secondary | ICD-10-CM

## 2023-03-10 DIAGNOSIS — Z9981 Dependence on supplemental oxygen: Secondary | ICD-10-CM | POA: Diagnosis not present

## 2023-03-10 DIAGNOSIS — M549 Dorsalgia, unspecified: Secondary | ICD-10-CM | POA: Diagnosis present

## 2023-03-10 DIAGNOSIS — R29890 Loss of height: Secondary | ICD-10-CM | POA: Diagnosis not present

## 2023-03-10 DIAGNOSIS — Z882 Allergy status to sulfonamides status: Secondary | ICD-10-CM

## 2023-03-10 DIAGNOSIS — S82842D Displaced bimalleolar fracture of left lower leg, subsequent encounter for closed fracture with routine healing: Secondary | ICD-10-CM | POA: Diagnosis not present

## 2023-03-10 DIAGNOSIS — E785 Hyperlipidemia, unspecified: Secondary | ICD-10-CM | POA: Diagnosis not present

## 2023-03-10 DIAGNOSIS — S82892A Other fracture of left lower leg, initial encounter for closed fracture: Principal | ICD-10-CM

## 2023-03-10 DIAGNOSIS — S82852A Displaced trimalleolar fracture of left lower leg, initial encounter for closed fracture: Secondary | ICD-10-CM

## 2023-03-10 DIAGNOSIS — S8252XA Displaced fracture of medial malleolus of left tibia, initial encounter for closed fracture: Secondary | ICD-10-CM | POA: Diagnosis not present

## 2023-03-10 DIAGNOSIS — R0689 Other abnormalities of breathing: Secondary | ICD-10-CM | POA: Diagnosis not present

## 2023-03-10 DIAGNOSIS — J8410 Pulmonary fibrosis, unspecified: Secondary | ICD-10-CM | POA: Diagnosis present

## 2023-03-10 DIAGNOSIS — Z8042 Family history of malignant neoplasm of prostate: Secondary | ICD-10-CM

## 2023-03-10 DIAGNOSIS — F418 Other specified anxiety disorders: Secondary | ICD-10-CM | POA: Diagnosis not present

## 2023-03-10 DIAGNOSIS — F1120 Opioid dependence, uncomplicated: Secondary | ICD-10-CM | POA: Diagnosis not present

## 2023-03-10 DIAGNOSIS — Z6838 Body mass index (BMI) 38.0-38.9, adult: Secondary | ICD-10-CM

## 2023-03-10 DIAGNOSIS — J449 Chronic obstructive pulmonary disease, unspecified: Secondary | ICD-10-CM | POA: Diagnosis not present

## 2023-03-10 DIAGNOSIS — Z8249 Family history of ischemic heart disease and other diseases of the circulatory system: Secondary | ICD-10-CM | POA: Diagnosis not present

## 2023-03-10 DIAGNOSIS — S82432A Displaced oblique fracture of shaft of left fibula, initial encounter for closed fracture: Secondary | ICD-10-CM | POA: Diagnosis not present

## 2023-03-10 DIAGNOSIS — Z9181 History of falling: Secondary | ICD-10-CM | POA: Diagnosis not present

## 2023-03-10 DIAGNOSIS — E039 Hypothyroidism, unspecified: Secondary | ICD-10-CM | POA: Diagnosis not present

## 2023-03-10 DIAGNOSIS — Z79890 Hormone replacement therapy (postmenopausal): Secondary | ICD-10-CM

## 2023-03-10 DIAGNOSIS — R069 Unspecified abnormalities of breathing: Secondary | ICD-10-CM | POA: Diagnosis not present

## 2023-03-10 DIAGNOSIS — G894 Chronic pain syndrome: Secondary | ICD-10-CM

## 2023-03-10 DIAGNOSIS — J9611 Chronic respiratory failure with hypoxia: Secondary | ICD-10-CM

## 2023-03-10 DIAGNOSIS — E2740 Unspecified adrenocortical insufficiency: Secondary | ICD-10-CM | POA: Diagnosis present

## 2023-03-10 DIAGNOSIS — Z1152 Encounter for screening for COVID-19: Secondary | ICD-10-CM

## 2023-03-10 DIAGNOSIS — E66812 Obesity, class 2: Secondary | ICD-10-CM

## 2023-03-10 DIAGNOSIS — J984 Other disorders of lung: Secondary | ICD-10-CM | POA: Diagnosis not present

## 2023-03-10 DIAGNOSIS — E782 Mixed hyperlipidemia: Secondary | ICD-10-CM | POA: Diagnosis not present

## 2023-03-10 DIAGNOSIS — J9621 Acute and chronic respiratory failure with hypoxia: Secondary | ICD-10-CM

## 2023-03-10 DIAGNOSIS — F1129 Opioid dependence with unspecified opioid-induced disorder: Secondary | ICD-10-CM | POA: Diagnosis not present

## 2023-03-10 DIAGNOSIS — I959 Hypotension, unspecified: Secondary | ICD-10-CM | POA: Diagnosis not present

## 2023-03-10 DIAGNOSIS — Z79899 Other long term (current) drug therapy: Secondary | ICD-10-CM

## 2023-03-10 DIAGNOSIS — R062 Wheezing: Secondary | ICD-10-CM | POA: Diagnosis not present

## 2023-03-10 DIAGNOSIS — K5900 Constipation, unspecified: Secondary | ICD-10-CM | POA: Diagnosis present

## 2023-03-10 DIAGNOSIS — Z91041 Radiographic dye allergy status: Secondary | ICD-10-CM | POA: Diagnosis not present

## 2023-03-10 DIAGNOSIS — F419 Anxiety disorder, unspecified: Secondary | ICD-10-CM | POA: Diagnosis not present

## 2023-03-10 DIAGNOSIS — F32A Depression, unspecified: Secondary | ICD-10-CM | POA: Diagnosis not present

## 2023-03-10 DIAGNOSIS — S8262XA Displaced fracture of lateral malleolus of left fibula, initial encounter for closed fracture: Secondary | ICD-10-CM | POA: Diagnosis not present

## 2023-03-10 DIAGNOSIS — M4854XA Collapsed vertebra, not elsewhere classified, thoracic region, initial encounter for fracture: Secondary | ICD-10-CM | POA: Diagnosis not present

## 2023-03-10 DIAGNOSIS — Z7952 Long term (current) use of systemic steroids: Secondary | ICD-10-CM

## 2023-03-10 DIAGNOSIS — S82842A Displaced bimalleolar fracture of left lower leg, initial encounter for closed fracture: Secondary | ICD-10-CM | POA: Diagnosis not present

## 2023-03-10 DIAGNOSIS — R000 Tachycardia, unspecified: Secondary | ICD-10-CM | POA: Diagnosis not present

## 2023-03-10 DIAGNOSIS — R609 Edema, unspecified: Secondary | ICD-10-CM | POA: Diagnosis not present

## 2023-03-10 DIAGNOSIS — G8918 Other acute postprocedural pain: Secondary | ICD-10-CM | POA: Diagnosis not present

## 2023-03-10 HISTORY — PX: ORIF ANKLE FRACTURE: SHX5408

## 2023-03-10 LAB — CBC
HCT: 34.3 % — ABNORMAL LOW (ref 36.0–46.0)
Hemoglobin: 10.5 g/dL — ABNORMAL LOW (ref 12.0–15.0)
MCH: 29.1 pg (ref 26.0–34.0)
MCHC: 30.6 g/dL (ref 30.0–36.0)
MCV: 95 fL (ref 80.0–100.0)
Platelets: 401 10*3/uL — ABNORMAL HIGH (ref 150–400)
RBC: 3.61 MIL/uL — ABNORMAL LOW (ref 3.87–5.11)
RDW: 14.6 % (ref 11.5–15.5)
WBC: 14.9 10*3/uL — ABNORMAL HIGH (ref 4.0–10.5)
nRBC: 0.1 % (ref 0.0–0.2)

## 2023-03-10 LAB — TROPONIN I (HIGH SENSITIVITY)
Troponin I (High Sensitivity): 3 ng/L (ref <18)
Troponin I (High Sensitivity): 3 ng/L (ref <18)

## 2023-03-10 LAB — BASIC METABOLIC PANEL
Anion gap: 9 (ref 5–15)
BUN: 10 mg/dL (ref 6–20)
CO2: 30 mmol/L (ref 22–32)
Calcium: 8.5 mg/dL — ABNORMAL LOW (ref 8.9–10.3)
Chloride: 97 mmol/L — ABNORMAL LOW (ref 98–111)
Creatinine, Ser: 0.88 mg/dL (ref 0.44–1.00)
GFR, Estimated: >60 mL/min (ref >60)
Glucose, Bld: 124 mg/dL — ABNORMAL HIGH (ref 70–99)
Potassium: 3.5 mmol/L (ref 3.5–5.1)
Sodium: 136 mmol/L (ref 135–145)

## 2023-03-10 LAB — SARS CORONAVIRUS 2 BY RT PCR: SARS Coronavirus 2 by RT PCR: NEGATIVE

## 2023-03-10 LAB — SURGICAL PCR SCREEN
MRSA, PCR: NEGATIVE
Staphylococcus aureus: NEGATIVE

## 2023-03-10 LAB — GLUCOSE, CAPILLARY: Glucose-Capillary: 157 mg/dL — ABNORMAL HIGH (ref 70–99)

## 2023-03-10 SURGERY — OPEN REDUCTION INTERNAL FIXATION (ORIF) ANKLE FRACTURE
Anesthesia: Monitor Anesthesia Care | Site: Ankle | Laterality: Left

## 2023-03-10 MED ORDER — PHENYLEPHRINE HCL-NACL 20-0.9 MG/250ML-% IV SOLN
INTRAVENOUS | Status: DC | PRN
  Administered 2023-03-10: 40 ug/min via INTRAVENOUS

## 2023-03-10 MED ORDER — ACETAMINOPHEN 650 MG RE SUPP: 650.0000 mg | Freq: Four times a day (QID) | RECTAL | Status: DC | PRN

## 2023-03-10 MED ORDER — DEXAMETHASONE SODIUM PHOSPHATE 10 MG/ML IJ SOLN
INTRAMUSCULAR | Status: DC | PRN
  Administered 2023-03-10: 10 mg via INTRAVENOUS

## 2023-03-10 MED ORDER — METHYLPREDNISOLONE SODIUM SUCC 125 MG IJ SOLR
60.0000 mg | Freq: Two times a day (BID) | INTRAMUSCULAR | Status: DC
Start: 2023-03-10 — End: 2023-03-10

## 2023-03-10 MED ORDER — CEFAZOLIN SODIUM-DEXTROSE 2-4 GM/100ML-% IV SOLN
2.0000 g | Freq: Three times a day (TID) | INTRAVENOUS | Status: AC
  Administered 2023-03-10 – 2023-03-11 (×3): 2 g via INTRAVENOUS
  Filled 2023-03-10 (×4): qty 100

## 2023-03-10 MED ORDER — HYDROXYZINE HCL 25 MG PO TABS
25.0000 mg | ORAL_TABLET | Freq: Three times a day (TID) | ORAL | Status: DC | PRN
  Administered 2023-03-10 – 2023-03-14 (×6): 25 mg via ORAL
  Filled 2023-03-10 (×7): qty 1

## 2023-03-10 MED ORDER — METHYLPREDNISOLONE SODIUM SUCC 125 MG IJ SOLR
125.0000 mg | Freq: Once | INTRAMUSCULAR | Status: AC
  Administered 2023-03-10: 125 mg via INTRAVENOUS
  Filled 2023-03-10: qty 2

## 2023-03-10 MED ORDER — HYDROMORPHONE HCL 1 MG/ML IJ SOLN
1.0000 mg | INTRAMUSCULAR | Status: DC | PRN
  Administered 2023-03-10 – 2023-03-14 (×23): 1 mg via INTRAVENOUS
  Filled 2023-03-10 (×23): qty 1

## 2023-03-10 MED ORDER — HEPARIN SODIUM (PORCINE) 5000 UNIT/ML IJ SOLN
5000.0000 [IU] | Freq: Three times a day (TID) | INTRAMUSCULAR | Status: DC
  Administered 2023-03-11 – 2023-03-14 (×9): 5000 [IU] via SUBCUTANEOUS
  Filled 2023-03-10 (×9): qty 1

## 2023-03-10 MED ORDER — HYDRALAZINE HCL 20 MG/ML IJ SOLN
INTRAMUSCULAR | Status: AC
  Filled 2023-03-10: qty 1

## 2023-03-10 MED ORDER — IPRATROPIUM-ALBUTEROL 0.5-2.5 (3) MG/3ML IN SOLN
3.0000 mL | Freq: Once | RESPIRATORY_TRACT | Status: AC
  Administered 2023-03-10: 3 mL via RESPIRATORY_TRACT
  Filled 2023-03-10: qty 3

## 2023-03-10 MED ORDER — ONDANSETRON HCL 4 MG/2ML IJ SOLN
INTRAMUSCULAR | Status: DC | PRN
  Administered 2023-03-10: 4 mg via INTRAVENOUS

## 2023-03-10 MED ORDER — FENTANYL CITRATE (PF) 100 MCG/2ML IJ SOLN
INTRAMUSCULAR | Status: AC
  Filled 2023-03-10: qty 2

## 2023-03-10 MED ORDER — KETAMINE HCL 50 MG/5ML IJ SOSY
30.0000 mg | PREFILLED_SYRINGE | Freq: Once | INTRAMUSCULAR | Status: AC
  Administered 2023-03-10: 30 mg via INTRAVENOUS
  Filled 2023-03-10: qty 5

## 2023-03-10 MED ORDER — ARFORMOTEROL TARTRATE 15 MCG/2ML IN NEBU
15.0000 ug | INHALATION_SOLUTION | Freq: Two times a day (BID) | RESPIRATORY_TRACT | Status: DC
  Administered 2023-03-10 – 2023-03-14 (×8): 15 ug via RESPIRATORY_TRACT
  Filled 2023-03-10 (×8): qty 2

## 2023-03-10 MED ORDER — HYDROMORPHONE HCL 1 MG/ML IJ SOLN: 0.2500 mg | INTRAMUSCULAR | Status: DC | PRN

## 2023-03-10 MED ORDER — PROPOFOL 10 MG/ML IV BOLUS
INTRAVENOUS | Status: DC | PRN
Start: 2023-03-10 — End: 2023-03-10
  Administered 2023-03-10: 20 mg via INTRAVENOUS

## 2023-03-10 MED ORDER — POLYETHYLENE GLYCOL 3350 17 G PO PACK
17.0000 g | PACK | Freq: Every day | ORAL | Status: DC
  Administered 2023-03-12 – 2023-03-14 (×3): 17 g via ORAL
  Filled 2023-03-10 (×3): qty 1

## 2023-03-10 MED ORDER — ONDANSETRON HCL 4 MG/2ML IJ SOLN
4.0000 mg | Freq: Four times a day (QID) | INTRAMUSCULAR | Status: DC | PRN
  Filled 2023-03-10: qty 2

## 2023-03-10 MED ORDER — 0.9 % SODIUM CHLORIDE (POUR BTL) OPTIME
TOPICAL | Status: DC | PRN
  Administered 2023-03-10: 1000 mL

## 2023-03-10 MED ORDER — TRANEXAMIC ACID-NACL 1000-0.7 MG/100ML-% IV SOLN
1000.0000 mg | INTRAVENOUS | Status: AC
  Administered 2023-03-10: 1000 mg via INTRAVENOUS

## 2023-03-10 MED ORDER — ALBUTEROL SULFATE (2.5 MG/3ML) 0.083% IN NEBU
INHALATION_SOLUTION | RESPIRATORY_TRACT | Status: AC
  Administered 2023-03-10: 2.5 mg via RESPIRATORY_TRACT
  Filled 2023-03-10: qty 3

## 2023-03-10 MED ORDER — BUPIVACAINE-EPINEPHRINE (PF) 0.5% -1:200000 IJ SOLN
INTRAMUSCULAR | Status: DC | PRN
  Administered 2023-03-10: 20 mL via PERINEURAL
  Administered 2023-03-10: 15 mL via PERINEURAL

## 2023-03-10 MED ORDER — PROPOFOL 500 MG/50ML IV EMUL
INTRAVENOUS | Status: DC | PRN
  Administered 2023-03-10: 75 ug/kg/min via INTRAVENOUS

## 2023-03-10 MED ORDER — HYDROMORPHONE HCL 1 MG/ML IJ SOLN
1.0000 mg | Freq: Once | INTRAMUSCULAR | Status: AC
  Administered 2023-03-10: 1 mg via INTRAVENOUS
  Filled 2023-03-10: qty 1

## 2023-03-10 MED ORDER — ALBUTEROL SULFATE (2.5 MG/3ML) 0.083% IN NEBU
INHALATION_SOLUTION | RESPIRATORY_TRACT | Status: AC
  Filled 2023-03-10: qty 3

## 2023-03-10 MED ORDER — LACTATED RINGERS IV SOLN: INTRAVENOUS | Status: DC

## 2023-03-10 MED ORDER — FENTANYL CITRATE (PF) 250 MCG/5ML IJ SOLN
INTRAMUSCULAR | Status: AC
  Filled 2023-03-10: qty 5

## 2023-03-10 MED ORDER — KETAMINE HCL 10 MG/ML IJ SOLN
INTRAMUSCULAR | Status: DC | PRN
  Administered 2023-03-10 (×2): 10 mg via INTRAVENOUS

## 2023-03-10 MED ORDER — BUSPIRONE HCL 5 MG PO TABS
15.0000 mg | ORAL_TABLET | Freq: Three times a day (TID) | ORAL | Status: DC
  Administered 2023-03-11 – 2023-03-14 (×10): 15 mg via ORAL
  Filled 2023-03-10 (×10): qty 3

## 2023-03-10 MED ORDER — HYDRALAZINE HCL 20 MG/ML IJ SOLN: 5.0000 mg | Freq: Once | INTRAMUSCULAR | Status: DC

## 2023-03-10 MED ORDER — TRANEXAMIC ACID-NACL 1000-0.7 MG/100ML-% IV SOLN
INTRAVENOUS | Status: AC
  Filled 2023-03-10: qty 100

## 2023-03-10 MED ORDER — CHLORHEXIDINE GLUCONATE 0.12 % MT SOLN: 15.0000 mL | Freq: Once | OROMUCOSAL | Status: AC

## 2023-03-10 MED ORDER — BUPIVACAINE IN DEXTROSE 0.75-8.25 % IT SOLN
INTRATHECAL | Status: DC | PRN
  Administered 2023-03-10: 2 mL via INTRATHECAL

## 2023-03-10 MED ORDER — PANTOPRAZOLE SODIUM 40 MG PO TBEC
40.0000 mg | DELAYED_RELEASE_TABLET | Freq: Two times a day (BID) | ORAL | Status: DC
  Administered 2023-03-11 – 2023-03-14 (×7): 40 mg via ORAL
  Filled 2023-03-10 (×7): qty 1

## 2023-03-10 MED ORDER — CEFAZOLIN SODIUM-DEXTROSE 2-4 GM/100ML-% IV SOLN
INTRAVENOUS | Status: AC
  Filled 2023-03-10: qty 100

## 2023-03-10 MED ORDER — LINACLOTIDE 145 MCG PO CAPS
290.0000 ug | ORAL_CAPSULE | Freq: Every day | ORAL | Status: DC
  Administered 2023-03-12 – 2023-03-14 (×3): 290 ug via ORAL
  Filled 2023-03-10 (×4): qty 2

## 2023-03-10 MED ORDER — CHLORHEXIDINE GLUCONATE 0.12 % MT SOLN
OROMUCOSAL | Status: AC
  Administered 2023-03-10: 15 mL via OROMUCOSAL
  Filled 2023-03-10: qty 15

## 2023-03-10 MED ORDER — LIDOCAINE 2% (20 MG/ML) 5 ML SYRINGE
INTRAMUSCULAR | Status: DC | PRN
  Administered 2023-03-10: 20 mg via INTRAVENOUS

## 2023-03-10 MED ORDER — CHLORHEXIDINE GLUCONATE 0.12 % MT SOLN
OROMUCOSAL | Status: AC
  Filled 2023-03-10: qty 15

## 2023-03-10 MED ORDER — ONDANSETRON HCL 4 MG PO TABS: 4.0000 mg | ORAL_TABLET | Freq: Four times a day (QID) | ORAL | Status: DC | PRN

## 2023-03-10 MED ORDER — IPRATROPIUM-ALBUTEROL 0.5-2.5 (3) MG/3ML IN SOLN
3.0000 mL | Freq: Four times a day (QID) | RESPIRATORY_TRACT | Status: DC
  Administered 2023-03-10 – 2023-03-14 (×12): 3 mL via RESPIRATORY_TRACT
  Filled 2023-03-10 (×12): qty 3

## 2023-03-10 MED ORDER — MIDAZOLAM HCL 2 MG/2ML IJ SOLN
INTRAMUSCULAR | Status: AC
  Administered 2023-03-10: 2 mg via INTRAVENOUS
  Filled 2023-03-10: qty 2

## 2023-03-10 MED ORDER — CEFAZOLIN SODIUM-DEXTROSE 2-4 GM/100ML-% IV SOLN: 2.0000 g | INTRAVENOUS | Status: DC

## 2023-03-10 MED ORDER — MIDAZOLAM HCL 2 MG/2ML IJ SOLN: 2.0000 mg | Freq: Once | INTRAMUSCULAR | Status: AC

## 2023-03-10 MED ORDER — ORAL CARE MOUTH RINSE: 15.0000 mL | Freq: Once | OROMUCOSAL | Status: AC

## 2023-03-10 MED ORDER — TRANEXAMIC ACID-NACL 1000-0.7 MG/100ML-% IV SOLN
1000.0000 mg | Freq: Once | INTRAVENOUS | Status: DC
  Filled 2023-03-10: qty 100

## 2023-03-10 MED ORDER — PHENYLEPHRINE 80 MCG/ML (10ML) SYRINGE FOR IV PUSH (FOR BLOOD PRESSURE SUPPORT)
PREFILLED_SYRINGE | INTRAVENOUS | Status: DC | PRN
  Administered 2023-03-10 (×2): 80 ug via INTRAVENOUS
  Administered 2023-03-10: 120 ug via INTRAVENOUS
  Administered 2023-03-10: 80 ug via INTRAVENOUS

## 2023-03-10 MED ORDER — DIAZEPAM 5 MG/ML IJ SOLN
2.5000 mg | Freq: Once | INTRAMUSCULAR | Status: AC
  Administered 2023-03-10: 2.5 mg via INTRAVENOUS
  Filled 2023-03-10: qty 2

## 2023-03-10 MED ORDER — ALBUTEROL SULFATE (2.5 MG/3ML) 0.083% IN NEBU
2.5000 mg | INHALATION_SOLUTION | Freq: Once | RESPIRATORY_TRACT | Status: AC
  Administered 2023-03-10: 2.5 mg via RESPIRATORY_TRACT

## 2023-03-10 MED ORDER — LEVOTHYROXINE SODIUM 50 MCG PO TABS
50.0000 ug | ORAL_TABLET | Freq: Every day | ORAL | Status: DC
  Administered 2023-03-11 – 2023-03-14 (×4): 50 ug via ORAL
  Filled 2023-03-10 (×4): qty 1

## 2023-03-10 MED ORDER — CEFAZOLIN SODIUM-DEXTROSE 2-4 GM/100ML-% IV SOLN
2.0000 g | INTRAVENOUS | Status: AC
  Administered 2023-03-10: 2 g via INTRAVENOUS

## 2023-03-10 MED ORDER — BUDESONIDE 0.5 MG/2ML IN SUSP
0.5000 mg | Freq: Two times a day (BID) | RESPIRATORY_TRACT | Status: DC
  Administered 2023-03-10 – 2023-03-14 (×8): 0.5 mg via RESPIRATORY_TRACT
  Filled 2023-03-10 (×8): qty 2

## 2023-03-10 MED ORDER — METHYLPREDNISOLONE SODIUM SUCC 125 MG IJ SOLR
120.0000 mg | INTRAMUSCULAR | Status: DC
  Administered 2023-03-11: 120 mg via INTRAVENOUS
  Filled 2023-03-10: qty 2

## 2023-03-10 MED ORDER — ACETAMINOPHEN 325 MG PO TABS
650.0000 mg | ORAL_TABLET | Freq: Four times a day (QID) | ORAL | Status: DC | PRN
  Administered 2023-03-10: 650 mg via ORAL
  Filled 2023-03-10: qty 2

## 2023-03-10 MED ORDER — ROSUVASTATIN CALCIUM 5 MG PO TABS
10.0000 mg | ORAL_TABLET | Freq: Every day | ORAL | Status: DC
  Administered 2023-03-11 – 2023-03-14 (×4): 10 mg via ORAL
  Filled 2023-03-10 (×4): qty 2

## 2023-03-10 MED ORDER — DULOXETINE HCL 60 MG PO CPEP
60.0000 mg | ORAL_CAPSULE | Freq: Every day | ORAL | Status: DC
  Administered 2023-03-11 – 2023-03-14 (×4): 60 mg via ORAL
  Filled 2023-03-10 (×4): qty 1

## 2023-03-10 MED ORDER — KETAMINE HCL 50 MG/5ML IJ SOSY
PREFILLED_SYRINGE | INTRAMUSCULAR | Status: AC
  Filled 2023-03-10: qty 5

## 2023-03-10 MED ORDER — OXYCODONE HCL ER 10 MG PO T12A
40.0000 mg | EXTENDED_RELEASE_TABLET | Freq: Two times a day (BID) | ORAL | Status: DC
  Administered 2023-03-10 – 2023-03-14 (×8): 40 mg via ORAL
  Filled 2023-03-10 (×8): qty 4

## 2023-03-10 MED ORDER — BUPIVACAINE LIPOSOME 1.3 % IJ SUSP
INTRAMUSCULAR | Status: DC | PRN
  Administered 2023-03-10: 10 mL via PERINEURAL

## 2023-03-10 MED ORDER — PROPOFOL 10 MG/ML IV BOLUS
INTRAVENOUS | Status: AC
  Filled 2023-03-10: qty 20

## 2023-03-10 MED ORDER — ALBUTEROL SULFATE (2.5 MG/3ML) 0.083% IN NEBU
2.5000 mg | INHALATION_SOLUTION | Freq: Once | RESPIRATORY_TRACT | Status: AC
  Filled 2023-03-10: qty 3

## 2023-03-10 SURGICAL SUPPLY — 75 items
APL PRP STRL LF DISP 70% ISPRP (MISCELLANEOUS) ×2
BAG COUNTER SPONGE SURGICOUNT (BAG) ×1 IMPLANT
BAG SPNG CNTER NS LX DISP (BAG) ×1
BANDAGE ESMARK 6X9 LF (GAUZE/BANDAGES/DRESSINGS) IMPLANT
BIT DRILL 2.7 QC CANN 155 (BIT) IMPLANT
BIT DRILL 2.7MM OVERBIT QC (BIT) IMPLANT
BIT DRILL OVR 3.5AO QC SHRT SM (DRILL) IMPLANT
BIT DRILL QC 2.0 SHORT EVOS SM (DRILL) IMPLANT
BIT DRILL QC 2.5MM SHRT EVO SM (DRILL) IMPLANT
BNDG CMPR 5X4 KNIT ELC UNQ LF (GAUZE/BANDAGES/DRESSINGS) ×1
BNDG CMPR 5X62 HK CLSR LF (GAUZE/BANDAGES/DRESSINGS) ×1
BNDG CMPR 6 X 5 YARDS HK CLSR (GAUZE/BANDAGES/DRESSINGS) ×1
BNDG CMPR 6"X 5 YARDS HK CLSR (GAUZE/BANDAGES/DRESSINGS) ×1
BNDG CMPR 9X6 STRL LF SNTH (GAUZE/BANDAGES/DRESSINGS)
BNDG ELASTIC 4INX 5YD STR LF (GAUZE/BANDAGES/DRESSINGS) IMPLANT
BNDG ELASTIC 6INX 5YD STR LF (GAUZE/BANDAGES/DRESSINGS) IMPLANT
BNDG ESMARK 6X9 LF (GAUZE/BANDAGES/DRESSINGS)
BNDG GAUZE DERMACEA FLUFF 4 (GAUZE/BANDAGES/DRESSINGS) ×1 IMPLANT
BNDG GZE DERMACEA 4 6PLY (GAUZE/BANDAGES/DRESSINGS)
CHLORAPREP W/TINT 26 (MISCELLANEOUS) IMPLANT
COVER SURGICAL LIGHT HANDLE (MISCELLANEOUS) ×1 IMPLANT
DRAPE C-ARM 42X120 X-RAY (DRAPES) IMPLANT
DRAPE C-ARMOR (DRAPES) IMPLANT
DRAPE OEC MINIVIEW 54X84 (DRAPES) IMPLANT
DRAPE U-SHAPE 47X51 STRL (DRAPES) ×1 IMPLANT
DRILL 2.7MM OVERDRILL QC (BIT) ×1
DRILL OVER 3.5 AO QC SHORT SM (DRILL) ×1
DRILL QC 2.0 SHORT EVOS SM (DRILL) ×1
DRILL QC 2.5MM SHORT EVOS SM (DRILL) ×1
DRSG ADAPTIC 3X8 NADH LF (GAUZE/BANDAGES/DRESSINGS) ×1 IMPLANT
ELECT REM PT RETURN 9FT ADLT (ELECTROSURGICAL) ×1
ELECTRODE REM PT RTRN 9FT ADLT (ELECTROSURGICAL) ×1 IMPLANT
GAUZE PAD ABD 8X10 STRL (GAUZE/BANDAGES/DRESSINGS) ×1 IMPLANT
GAUZE SPONGE 4X4 12PLY STRL (GAUZE/BANDAGES/DRESSINGS) ×1 IMPLANT
GLOVE BIOGEL PI IND STRL 9 (GLOVE) ×1 IMPLANT
GLOVE SURG ORTHO 9.0 STRL STRW (GLOVE) ×1 IMPLANT
GOWN STRL REUS W/ TWL XL LVL3 (GOWN DISPOSABLE) ×3 IMPLANT
GOWN STRL REUS W/TWL XL LVL3 (GOWN DISPOSABLE)
GUIDE PIN 1.3 (PIN) ×2
KIT BASIN OR (CUSTOM PROCEDURE TRAY) ×1 IMPLANT
KIT INVISIKNOT ANKLE FRACTURE (Screw) IMPLANT
KIT TURNOVER KIT B (KITS) ×1 IMPLANT
MANIFOLD NEPTUNE II (INSTRUMENTS) ×1 IMPLANT
NS IRRIG 1000ML POUR BTL (IV SOLUTION) ×1 IMPLANT
PACK ORTHO EXTREMITY (CUSTOM PROCEDURE TRAY) ×1 IMPLANT
PAD ARMBOARD 7.5X6 YLW CONV (MISCELLANEOUS) ×2 IMPLANT
PAD CAST 4YDX4 CTTN HI CHSV (CAST SUPPLIES) IMPLANT
PADDING CAST ABS COTTON 6X4 NS (CAST SUPPLIES) IMPLANT
PADDING CAST COTTON 4X4 STRL (CAST SUPPLIES) ×1
PADDING CAST COTTON 6X4 STRL (CAST SUPPLIES) IMPLANT
PADDING CAST SYNTHETIC 6X4 NS (CAST SUPPLIES) IMPLANT
PIN GUIDE 1.3 (PIN) IMPLANT
PLATE L.DISTAL 2.7/3.5 7H EVOS (Plate) IMPLANT
SCREW CANN 4.0 46MM (Screw) IMPLANT
SCREW CANN PT STD 4X44 (Screw) IMPLANT
SCREW CORT 2.7X10 STAR T8 EVOS (Screw) IMPLANT
SCREW CORT 2.7X14 T8 EVOS (Screw) IMPLANT
SCREW CORT 2.7X19 T8 ST EVOS (Screw) IMPLANT
SCREW CORT EVOS ST 3.5X12 (Screw) IMPLANT
SCREW EVOS 2.7X11 LOCK T8 (Screw) IMPLANT
SCREW LOCK 2.7X13 ST EVOS (Screw) IMPLANT
SCREW LOCK 2.7X8 (Screw) ×1 IMPLANT
SCREW LOCK ST EVOS 2.7X7 (Screw) IMPLANT
SCREW LOCK T8 8X2.7XST (Screw) IMPLANT
SPLINT PLASTER CAST FAST 5X30 (CAST SUPPLIES) IMPLANT
STAPLER VISISTAT 35W (STAPLE) IMPLANT
SUCTION TUBE FRAZIER 10FR DISP (SUCTIONS) ×1 IMPLANT
SUT ETHILON 2 0 PSLX (SUTURE) IMPLANT
SUT ETHILON 3 0 PS 1 (SUTURE) IMPLANT
SUT VIC AB 2-0 CT1 27 (SUTURE) ×2
SUT VIC AB 2-0 CT1 TAPERPNT 27 (SUTURE) ×1 IMPLANT
SUT VIC AB 2-0 SH 18 (SUTURE) IMPLANT
TOWEL GREEN STERILE (TOWEL DISPOSABLE) ×1 IMPLANT
TOWEL GREEN STERILE FF (TOWEL DISPOSABLE) ×1 IMPLANT
TUBE CONNECTING 12X1/4 (SUCTIONS) ×1 IMPLANT

## 2023-03-10 NOTE — H&P (Signed)
History and Physical    Patient: Kathleen [name] UEA:[id number] DOB: [birth year] DOA: 03/10/2023 DOS: the patient was seen and examined on 03/10/2023 PCP: [name], MD  Patient coming from: Home  Chief Complaint:  Chief Complaint  Patient presents with   Fall   HPI: Kathleen [name] is a 62 year old female with a history of chronic respiratory failure on 4 L, fibromyalgia, anxiety/depression, opioid dependence, adrenal insufficiency, hypothyroidism, sarcoidosis, pulmonary fibrosis presenting with a mechanical fall when she was getting out of bed in the early a.m. 03/10/2023.  She had pain in the left leg and was unable to get up.  EMS was activated but the patient to the emergency department.  X-ray showed an acute closed bimalleolar fracture with lateral dislocation of the left ankle.   In addition, the patient states that she has been a little more short of breath than usual for the past 2 days.  She complains of a largely nonproductive cough.  She denies any fevers, chills, chest pain, nausea, vomiting or abdominal pain.  There is no hemoptysis, there is no hematochezia or melena.  At baseline, the patient is on 4 L nasal cannula.  She has never smoked.   In the ED, the patient was afebrile and hemodynamically stable with oxygen saturation 94-95% on 4 L.  WBC 14.9, hemoglobin 10.5, platelets 401,000.  Sodium 136, potassium 3.5, bicarbonate 30, serum creatinine 0.8.  Chest x-ray showed chronic interstitial changes and distorted architecture without any acute findings. Review of Systems: As mentioned in the history of present illness. All other systems reviewed and are negative. Past Medical History:  Diagnosis Date   Adrenal insufficiency (HCC)    Anemia    Anxiety    Avascular bone necrosis (HCC)    Breast fibrocystic disorder    Chronic pain    COPD (chronic obstructive pulmonary disease) (HCC)    Encounter for long-term opiate analgesic use 02/02/2020   Patient has chronic pain  with femoral head avascular necrosis.  Is under pain contract   Fibromyalgia    Gastroesophageal reflux disease    Hypothyroidism    Mitral valve prolapse    Orthostatic hypotension    Peripheral neuropathy    PONV (postoperative nausea and vomiting)    Restrictive lung disease    Moderate to severe   Sarcoidosis    Biopsy proven - [facility]   SOB (shortness of breath)    chronic   Past Surgical History:  Procedure Laterality Date   BIOPSY  10/17/2018   Procedure: BIOPSY;  Surgeon: Malissa Hippo, MD;  Location: [facility];  Service: Endoscopy;;  duodenum, antrum, gastric body   BIOPSY  03/03/2021   Procedure: BIOPSY;  Surgeon: Malissa Hippo, MD;  Location: [facility];  Service: Endoscopy;;   BREAST LUMPECTOMY Bilateral 01/12/2011   CHOLECYSTECTOMY N/A 04/17/2021   Procedure: LAPAROSCOPIC CHOLECYSTECTOMY;  Surgeon: Franky Macho, MD;  Location: [facility];  Service: General;  Laterality: N/A;   COLONOSCOPY WITH PROPOFOL N/A 01/03/2022   Procedure: COLONOSCOPY WITH PROPOFOL;  Surgeon: Malissa Hippo, MD;  Location: [facility];  Service: Endoscopy;  Laterality: N/A;  220   ESOPHAGEAL DILATION N/A 03/03/2021   Procedure: ESOPHAGEAL DILATION;  Surgeon: Malissa Hippo, MD;  [facility]: [facility];  Service: Endoscopy;  Laterality: N/A;   ESOPHAGOGASTRODUODENOSCOPY (EGD) WITH PROPOFOL N/A 10/17/2018   Procedure: ESOPHAGOGASTRODUODENOSCOPY (EGD) WITH PROPOFOL;  Surgeon: Malissa Hippo, MD;  [facility];  Service: Endoscopy;  Laterality: N/A;  2:25   ESOPHAGOGASTRODUODENOSCOPY (EGD) WITH PROPOFOL N/A 03/03/2021   Procedure: ESOPHAGOGASTRODUODENOSCOPY (EGD) WITH PROPOFOL;  Surgeon: Malissa Hippo, MD;  [facility];  Service: Endoscopy;  Laterality: N/A;  12:15   POLYPECTOMY  01/03/2022   Procedure: POLYPECTOMY INTESTINAL;  Surgeon: Malissa Hippo, MD;  [facility]: [facility];  Service: Endoscopy;;   TUBAL LIGATION     Social History:  reports that  she has never smoked. She has never been exposed to tobacco smoke. She has never used smokeless tobacco. She reports that she does not drink alcohol and does not use drugs.  Allergies  Allergen Reactions   Bee Venom Anaphylaxis   Contrast Media [Iodinated Contrast Media]     CT chest w/ contrast at OSH followed by SOB resolved w/ single dose steroids, never ENT swelling, intubation or pressors, has had multiple contrasted scans since   Lactose Intolerance (Gi)     GI Upset   Bactrim [Sulfamethoxazole-Trimethoprim] Nausea And Vomiting   Codeine Itching   Morphine And Codeine Other (See Comments)    Unknown reaction- pt gets sick, dizzy, and confused Intolerance, not an allergy, received this in the emergency department recently without a problem.    Family History  Problem Relation Age of Onset   Cardiomyopathy Mother    Breast cancer Mother    Prostate cancer Father    Heart attack Maternal Grandmother    Heart attack Maternal Grandfather    Heart attack Paternal Grandmother    Bipolar disorder Daughter    Heart disease Maternal Aunt    Neurofibromatosis Cousin    Colon cancer Neg Hx     Prior to Admission medications   Medication Sig Start Date End Date Taking? Authorizing Provider  busPIRone (BUSPAR) 10 MG tablet Take 1 tablet 3 times daily as needed to help with anxiety 03/01/23   Luking, Jonna Coup, MD  acetaminophen (TYLENOL) 500 MG tablet Take 1,000 mg by mouth every 8 (eight) hours as needed for headache.    [provider]  albuterol (PROVENTIL) (2.5 MG/3ML) 0.083% nebulizer solution Take 3 mLs (2.5 mg total) by nebulization every 4 (four) hours as needed for wheezing or shortness of breath. 12/15/22 12/15/23  Shon Hale, MD  albuterol (VENTOLIN HFA) 108 (90 Base) MCG/ACT inhaler Inhale 2 puffs into the lungs every 6 (six) hours as needed for wheezing or shortness of breath. 12/15/22   Shon Hale, MD  ascorbic acid (VITAMIN C) 500 MG tablet Take 1 tablet (500  mg total) by mouth daily. 05/23/21   Shon Hale, MD  budesonide (PULMICORT) 0.5 MG/2ML nebulizer solution Inhale the contents of 1 vial via nebulizer two (2) times a day. 11/22/21     busPIRone (BUSPAR) 15 MG tablet Take 1 tablet (15 mg) by mouth 3 times daily. 03/07/23   [name], MD  calcitonin, salmon, (MIACALCIN) 200 UNIT/ACT nasal spray Use 1 spray in one nostril per day, alternate nostrils every other day (may have 1 vial she should use this for at least 2 weeks but may use as long as 4 weeks) 03/01/23   [name], MD  DULoxetine (CYMBALTA) 60 MG capsule Take 1 capsule (60 mg) by mouth daily. 03/07/23   [name], MD  EPINEPHrine 0.3 mg/0.3 mL IJ SOAJ injection Inject 0.3 mg into the muscle as needed for anaphylaxis. 01/05/22   [name], MD  ferrous sulfate 325 (65 FE) MG EC tablet Take 325 mg by mouth as needed.  [provider]  formoterol (PERFOROMIST) 20 MCG/2ML nebulizer solution Inhale the contents of 1 vial via nebulizer two (2) times a day. 12/15/22   Shon Hale, MD  formoterol (PERFOROMIST) 20 MCG/2ML nebulizer solution Inhale 1 vial via nebulizer two (2) times a day. 12/17/22     formoterol (PERFOROMIST) 20 MCG/2ML nebulizer solution Inhale into the lungs. 12/17/22 12/17/23  [provider]  hydrocortisone (CORTEF) 10 MG tablet Take 2 tablets by mouth at 8:00 am and 1 tablet at lunch. (May double dose on sick days x 3-5 days) 10/24/22   Roma Kayser, MD  hydrOXYzine (ATARAX) 25 MG tablet Take 1 tablet (25 mg total) by mouth 3 (three) times daily as needed for anxiety. 02/26/23 02/26/24  [name], MD  ipratropium-albuterol (DUONEB) 0.5-2.5 (3) MG/3ML SOLN Inhale 1 vial via nebulizer every 8 hours. 12/17/22     levothyroxine (SYNTHROID) 50 MCG tablet Take 1 tablet by mouth daily before breakfast. 10/24/22 06/10/23  Roma Kayser, MD  linaclotide [facility]) 290 MCG CAPS capsule Take 1 capsule (290 mcg total) by mouth daily  before breakfast. 03/28/22   [name], MD  naloxone [facility]) nasal spray 4 mg/0.1 mL Use as directed for opioid overdose 12/26/22   [name], MD  naloxone [facility]) nasal spray 4 mg/0.1 mL Place 1 spray into the nose as needed for accidental overdose. 02/09/23     ondansetron (ZOFRAN-ODT) 4 MG disintegrating tablet Take 1 tablet (4 mg total) by mouth every 8 (eight) hours as needed for nausea or vomiting. 03/02/22   Milagros Loll, MD  oxyCODONE (OXY IR/ROXICODONE) 5 MG immediate release tablet Take 1 tablet (5 mg total) by mouth up to 4 a day as needed for breakthrough pain 03/07/23   [name], MD  oxyCODONE (OXYCONTIN) 40 mg 12 hr tablet Take 1 tablet (40 mg total) by mouth every 12 (twelve) hours. 03/07/23   [name], MD  pantoprazole (PROTONIX) 40 MG tablet Take 1 tablet (40 mg total) by mouth 2 (two) times daily before a meal. 12/15/22 12/15/23  Emokpae, Courage, MD  predniSONE (DELTASONE) 10 MG tablet Take 1 tablet (10 mg total) by mouth See admin instructions. Take Prednisone 60 mg (6 tab) daily for 3 days, then 5 tab (50 mg) daily x 3 days , then Prednisone 40mg  (4 tabs) daily x 3 days, then 30mg  (3 tabs) x 3 days , then 20 mg (2 tabs) daily x 3 days, then 10mg  daily indefinitely until you see Dr. Dudley Major 12/15/22   Shon Hale, MD  predniSONE (DELTASONE) 5 MG tablet Take 1 tablet (5 mg total) by mouth in the morning. Patient taking differently: Take 10 mg by mouth in the morning. 07/04/22     rosuvastatin (CRESTOR) 10 MG tablet Take 1 tablet (10 mg total) by mouth daily. 07/03/22   [name], MD  valACYclovir (VALTREX) 1000 MG tablet Take 1 tablet (1,000 mg total) by mouth 3 (three) times daily. 01/09/23   [name], MD    Physical Exam: Vitals:   03/10/23 0730 03/10/23 1056 03/10/23 1100 03/10/23 1200  BP: 107/81  (!) 143/92 (!) 141/96  Pulse: (!) 113  (!) 103 (!) 106  Resp: 13  10 15   Temp:      TempSrc:      SpO2: 94% 95% 100% 95%  Weight:       Height:       GENERAL:  A&O x 3, NAD, well developed, cooperative, follows  commands HEENT: /AT, No thrush, No icterus, No oral ulcers Neck:  No neck mass, No meningismus, soft, supple CV: RRR, no S3, no S4, no rub, no JVD Lungs:  bibasilar rales.  +wheeze bilateral Abd: soft/NT +BS, nondistended Ext: No edema, no lymphangitis, no cyanosis, no rashes Neuro:  CN II-XII intact, strength 4/5 in RUE, RLE, strength 4/5 LUE, LLE; sensation intact bilateral; no dysmetria; babinski equivocal  Data Reviewed: Data reviewed above in history  Assessment and Plan:  Closed left bimalleolar ankle fracture -EDP consulted ortho, Dr. Frederica Kuster to [facility] for surgery -remain npo -judicious opioids -Fracture was reduced by EDP and currently in a splint   Interstitial lung disease/sarcoidosis exacerbation -Viral respiratory panel -Check COVID-19 -Continue IV Solu-Medrol -Start Brovana -Start Pulmicort -Continue DuoNebs -02/26/2023 CTA chest negative for PE; scattered scarring and bronchiectasis   Chronic respiratory failure with hypoxia -Chronically on 4 L nasal cannula -Currently stable on 4 L without increased work of breathing   Adrenal insufficiency -Patient chronically on hydrocortisone--20 mg a.m., 10 mg at lunch -She will be on IV Solu-Medrol   Opioid dependence/chronic pain -PDMP reviewed -oxycontin ER 40 mg, #28, last refill 03/01/2023 -Oxy IR 5 mg, #56, last refill 03/01/2023   Anxiety/depression/fibromyalgia -Resume escitalopram and hydroxyzine   Hyperlipidemia -Continue statin   Class II obesity -BMI 36.54 -Lifestyle modification   Hypothyroidism -Continue levothyroxine     Advance Care Planning: FULL CODE  Consults: Ortho--[place]  Family Communication: spouse updated 7/14  Severity of Illness: The appropriate patient status for this patient is INPATIENT. Inpatient status is judged to be reasonable and necessary in order to provide the required intensity of  service to ensure the patient's safety. The patient's presenting symptoms, physical exam findings, and initial radiographic and laboratory data in the context of their chronic comorbidities is felt to place them at high risk for further clinical deterioration. Furthermore, it is not anticipated that the patient will be medically stable for discharge from the hospital within 2 midnights of admission.   * I certify that at the point of admission it is my clinical judgment that the patient will require inpatient hospital care spanning beyond 2 midnights from the point of admission due to high intensity of service, high risk for further deterioration and high frequency of surveillance required.*  Author: Catarina Hartshorn, MD 03/10/2023 12:46 PM  For on call review [email or web address].

## 2023-03-10 NOTE — Progress Notes (Addendum)
I personally saw the patient and performed a substantive portion of the medical decision making, in conjunction with the Advanced Practice Provider for the condition/treatment of Kathleen [name].  In brief, this is a 62 year old female with a history of chronic respiratory failure on 4 L, fibromyalgia, anxiety/depression, opioid dependence, adrenal insufficiency, hypothyroidism, sarcoidosis, pulmonary fibrosis presenting with a mechanical fall when she was getting out of bed in the early a.m. 03/10/2023.  She had pain in the left leg and was unable to get up.  EMS was activated but the patient to the emergency department.  X-ray showed an acute closed bimalleolar fracture with lateral dislocation of the left ankle.  In addition, the patient states that she has been a little more short of breath than usual for the past 2 days.  She complains of a largely nonproductive cough.  She denies any fevers, chills, chest pain, nausea, vomiting or abdominal pain.  There is no hemoptysis, there is no hematochezia or melena.  At baseline, the patient is on 4 L nasal cannula.  She has never smoked.  In the ED, the patient was afebrile and hemodynamically stable with oxygen saturation 94-95% on 4 L.  WBC 14.9, hemoglobin 10.5, platelets 401,000.  Sodium 136, potassium 3.5, bicarbonate 30, serum creatinine 0.8.  Chest x-ray showed chronic interstitial changes and distorted architecture without any acute findings.   Closed left bimalleolar ankle fracture -EDP consulted ortho, Dr. Frederica Kuster to [facility] for surgery -remain npo -judicious opioids -Fracture was reduced by EDP and currently in a splint  Interstitial lung disease/sarcoidosis exacerbation -Viral respiratory panel -Check COVID-19 -Continue IV Solu-Medrol -Start Brovana -Start Pulmicort -Continue DuoNebs -02/26/2023 CTA chest negative for PE; scattered scarring and bronchiectasis  Chronic respiratory failure with hypoxia -Chronically on 4 L nasal  cannula -Currently stable on 4 L without increased work of breathing  Adrenal insufficiency -Patient chronically on hydrocortisone--20 mg a.m., 10 mg at lunch -She will be on IV Solu-Medrol  Opioid dependence/chronic pain -PDMP reviewed -oxycontin ER 40 mg, #28, last refill 03/01/2023 -Oxy IR 5 mg, #56, last refill 03/01/2023  Anxiety/depression/fibromyalgia -Resume escitalopram and hydroxyzine  Hyperlipidemia -Continue statin  Class II obesity -BMI 36.54 -Lifestyle modification  Hypothyroidism -Continue levothyroxine  Kathleen Hartshorn, DO [facility]

## 2023-03-10 NOTE — ED Provider Notes (Signed)
[facility] DE[facility] Emergency Department Provider Note MRN:  [id number]  Arrival date & time: 03/10/23     Chief Complaint   Fall   History of Present Illness   Kathleen [name] is a 62 y.o. year-old female with a history of COPD presenting to the ED with chief complaint of fall.  Getting out of bed and fell and is now endorsing severe pain to the left ankle.  Denies head trauma or loss of consciousness, no neck or back pain, no chest pain or shortness of breath, no abdominal pain.  Patient also endorsing worsening shortness of breath over the past 2 days with mild increased cough.  Audibly wheezing.  Review of Systems  A thorough review of systems was obtained and all systems are negative except as noted in the HPI and PMH.   Patient's Health History    Past Medical History:  Diagnosis Date   Adrenal insufficiency (HCC)    Anemia    Anxiety    Avascular bone necrosis (HCC)    Breast fibrocystic disorder    Chronic pain    COPD (chronic obstructive pulmonary disease) (HCC)    Encounter for long-term opiate analgesic use 02/02/2020   Patient has chronic pain with femoral head avascular necrosis.  Is under pain contract   Fibromyalgia    Gastroesophageal reflux disease    Hypothyroidism    Mitral valve prolapse    Orthostatic hypotension    Peripheral neuropathy    PONV (postoperative nausea and vomiting)    Restrictive lung disease    Moderate to severe   Sarcoidosis    Biopsy proven - [facility]   SOB (shortness of breath)    chronic    Past Surgical History:  Procedure Laterality Date   BIOPSY  10/17/2018   Procedure: BIOPSY;  Surgeon: Malissa Hippo, MD;  Location: [facility];  Service: Endoscopy;;  duodenum, antrum, gastric body   BIOPSY  03/03/2021   Procedure: BIOPSY;  Surgeon: Malissa Hippo, MD;  Location: [facility];  Service: Endoscopy;;   BREAST LUMPECTOMY Bilateral 01/12/2011   CHOLECYSTECTOMY N/A 04/17/2021   Procedure: LAPAROSCOPIC  CHOLECYSTECTOMY;  Surgeon: Franky Macho, MD;  Location: [facility] ORS;  Service: General;  Laterality: N/A;   COLONOSCOPY WITH PROPOFOL N/A 01/03/2022   Procedure: COLONOSCOPY WITH PROPOFOL;  Surgeon: Malissa Hippo, MD;  Location: [facility];  Service: Endoscopy;  Laterality: N/A;  220   ESOPHAGEAL DILATION N/A 03/03/2021   Procedure: ESOPHAGEAL DILATION;  Surgeon: Malissa Hippo, MD;  Location: [facility];  Service: Endoscopy;  Laterality: N/A;   ESOPHAGOGASTRODUODENOSCOPY (EGD) WITH PROPOFOL N/A 10/17/2018   Procedure: ESOPHAGOGASTRODUODENOSCOPY (EGD) WITH PROPOFOL;  Surgeon: Malissa Hippo, MD;  Location[facility];  Service: Endoscopy;  Laterality: N/A;  2:25   ESOPHAGOGASTRODUODENOSCOPY (EGD) WITH PROPOFOL N/A 03/03/2021   Procedure: ESOPHAGOGASTRODUODENOSCOPY (EGD) WITH PROPOFOL;  Surgeon: Malissa Hippo, MD;  Location[facility];  Service: Endoscopy;  Laterality: N/A;  12:15   POLYPECTOMY  01/03/2022   Procedure: POLYPECTOMY INTESTINAL;  Surgeon: Malissa Hippo, MD;  Location: [facility];  Service: Endoscopy;;   TUBAL LIGATION      Family History  Problem Relation Age of Onset   Cardiomyopathy Mother    Breast cancer Mother    Prostate cancer Father    Heart attack Maternal Grandmother    Heart attack Maternal Grandfather    Heart attack Paternal Grandmother    Bipolar disorder Daughter    Heart  disease Maternal Aunt    Neurofibromatosis Cousin    Colon cancer Neg Hx     Social History   Socioeconomic History   Marital status: Married    Spouse name: Not on file   Number of children: 1   Years of education: Not on file   Highest education level: Not on file  Occupational History   Occupation: Midwife Payable    Employer: [employer]  Tobacco Use   Smoking status: Never    Passive exposure: Never   Smokeless tobacco: Never  Vaping Use   Vaping status: Never Used  Substance and Sexual Activity   Alcohol use: No   Drug use: No   Sexual  activity: Yes    Birth control/protection: Post-menopausal  Other Topics Concern   Not on file  Social History Narrative   Daily caffeine    Living with husband   Right handed   She has been on disability since 2014.    Social Determinants of Health   Financial Resource Strain: Low Risk  (09/21/2022)   Overall Financial Resource Strain (CARDIA)    Difficulty of Paying Living Expenses: Not hard at all  Food Insecurity: No Food Insecurity (01/14/2023)   Hunger Vital Sign    Worried About Running Out of Food in the Last Year: Never true    Ran Out of Food in the Last Year: Never true  Transportation Needs: No Transportation Needs (01/14/2023)   PRAPARE - Administrator, Civil Service (Medical): No    Lack of Transportation (Non-Medical): No  Physical Activity: Inactive (09/21/2022)   Exercise Vital Sign    Days of Exercise per Week: 0 days    Minutes of Exercise per Session: 0 min  Stress: No Stress Concern Present (01/14/2023)   [employer] of Occupational Health - Occupational Stress Questionnaire    Feeling of Stress : Only a little  Social Connections: Socially Integrated (09/21/2022)   Social Connection and Isolation Panel [NHANES]    Frequency of Communication with Friends and Family: More than three times a week    Frequency of Social Gatherings with Friends and Family: Three times a week    Attends Religious Services: 1 to 4 times per year    Active Member of Clubs or Organizations: No    Attends Banker Meetings: 1 to 4 times per year    Marital Status: Married  Catering manager Violence: Not At Risk (09/21/2022)   Humiliation, Afraid, Rape, and Kick questionnaire    Fear of Current or Ex-Partner: No    Emotionally Abused: No    Physically Abused: No    Sexually Abused: No     Physical Exam   Vitals:   03/10/23 0715 03/10/23 0730  BP: 114/63 107/81  Pulse: (!) 107 (!) 113  Resp: (!) 8 13  Temp:    SpO2: 92% 94%    CONSTITUTIONAL:  Chronically ill-appearing, NAD NEURO/PSYCH:  Alert and oriented x 3, no focal deficits EYES:  eyes equal and reactive ENT/NECK:  no LAD, no JVD CARDIO: Regular rate, well-perfused, normal S1 and S2 PULM: Lateral to diffuse wheezing heard from doorway, mild increased work of breathing GI/GU:  non-distended, non-tender MSK/SPINE: Gross deformity of the left ankle with lateral deviation of the foot SKIN:  no rash, atraumatic   *Additional and/or pertinent findings included in MDM below  Diagnostic and Interventional Summary    EKG Interpretation Date/Time:  Sunday March 10 2023 05:42:10 EDT Ventricular Rate:  102 PR  Interval:  122 QRS Duration:  79 QT Interval:  358 QTC Calculation: 467 R Axis:   63  Text Interpretation: Sinus tachycardia Low voltage, precordial leads Abnormal R-wave progression, early transition Borderline T abnormalities, diffuse leads Confirmed by Kennis Carina [phone]) on 03/10/2023 7:57:38 AM       Labs Reviewed  CBC - Abnormal; Notable for the following components:      Result Value   WBC 14.9 (*)    RBC 3.61 (*)    Hemoglobin 10.5 (*)    HCT 34.3 (*)    Platelets 401 (*)    All other components within normal limits  BASIC METABOLIC PANEL - Abnormal; Notable for the following components:   Chloride 97 (*)    Glucose, Bld 124 (*)    Calcium 8.5 (*)    All other components within normal limits  TROPONIN I (HIGH SENSITIVITY)  TROPONIN I (HIGH SENSITIVITY)    DG Chest Port 1 View  Final Result    DG Ankle Complete Left  Final Result    DG Ankle Complete Left    (Results Pending)    Medications  HYDROmorphone (DILAUDID) injection 1 mg (1 mg Intravenous Given 03/10/23 0559)  ipratropium-albuterol (DUONEB) 0.5-2.5 (3) MG/3ML nebulizer solution 3 mL (3 mLs Nebulization Given 03/10/23 0608)  albuterol (PROVENTIL) (2.5 MG/3ML) 0.083% nebulizer solution (2.5 mg  Given 03/10/23 6045)  HYDROmorphone (DILAUDID) injection 1 mg (1 mg Intravenous Given 03/10/23  0631)  ketamine 50 mg in normal saline 5 mL (10 mg/mL) syringe (30 mg Intravenous Given 03/10/23 0642)  methylPREDNISolone sodium succinate (SOLU-MEDROL) 125 mg/2 mL injection 125 mg (125 mg Intravenous Given 03/10/23 0800)     Procedures  /  Critical Care Reduction of dislocation  Date/Time: 03/10/2023 7:58 AM  Performed by: Sabas Sous, MD Authorized by: Sabas Sous, MD  Consent: Verbal consent obtained. Risks and benefits: risks, benefits and alternatives were discussed Consent given by: patient Patient understanding: patient states understanding of the procedure being performed Imaging studies: imaging studies available Patient identity confirmed: verbally with patient Time out: Immediately prior to procedure a "time out" was called to verify the correct patient, procedure, equipment, support staff and site/side marked as required. Local anesthesia used: no  Anesthesia: Local anesthesia used: no  Sedation: Patient sedated: no  Patient tolerance: patient tolerated the procedure well with no immediate complications Comments: Patient had very tight skin tenting on exam, concern for vascular compromise of this area of skin, warranting a more urgent reduction.  Unfortunately patient with very severe COPD chronically on 4 L and so a very poor sedation candidate.  Discussed this with the patient who agreed with plan for heavy analgesia prior to the reduction attempt.  Patient treated with Dilaudid and a pain dose of ketamine which facilitated the reduction.     ED Course and Medical Decision Making  Initial Impression and Ddx Suspect COPD exacerbation for the past few days, also with gross deformity to the ankle, mechanical fall.  Good cap refill, DP pulse palpated.  When I was in the room with the patient she moved her leg and I saw her fracture/reduction shift and prominent skin tenting was noted near the medial malleolus site.  And so prompt reduction was necessary, complicated  by patient's severe lung disease and so she was not a good candidate for full sedation.  See procedural details above.  Past medical/surgical history that increases complexity of ED encounter: COPD on 4 L chronically  Interpretation of Diagnostics I  personally reviewed the EKG and my interpretation is as follows: Sinus rhythm  Labs overall reassuring with no significant blood count or electrolyte disturbance.  Mild leukocytosis.  Patient Reassessment and Ultimate Disposition/Management     Case discussed with Dr. Christell Constant of orthopedics, plan is for hospitalist admission at [facility], may be able to fix the ankle today.  Accepted for admission by hospitalist team.  Providing DuoNeb, steroids.  Post procedure foot is neurovascularly intact, patient is much more comfortable, postreduction film is improved.  Patient management required discussion with the following services or consulting groups:  Hospitalist Service and Orthopedic Surgery  Complexity of Problems Addressed Acute illness or injury that poses threat of life of bodily function  Additional Data Reviewed and Analyzed Further history obtained from: EMS on arrival  Additional Factors Impacting ED Encounter Risk Consideration of hospitalization and Major procedures  Elmer Sow. Pilar Plate, MD [facility] Emergency Medicine [facility] [email or web address]  Final Clinical Impressions(s) / ED Diagnoses     ICD-10-CM   1. Closed fracture of left ankle, initial encounter  S82.892A     2. Pulmonary fibrosis (HCC)  J84.10       ED Discharge Orders     None        Discharge Instructions Discussed with and Provided to Patient:   Discharge Instructions   None      Sabas Sous, MD 03/10/23 [phone]

## 2023-03-10 NOTE — Discharge Instructions (Addendum)
Orthopedic Surgery Discharge Instructions  Patient name: Kathleen [name] Fracture: left ankle fracture Procedure Performed: left ankle fracture open reduction internal fixation and open reduction of syndesmotic disruption Date of Surgery: 03/10/2023 Surgeon: Willia Craze, MD  Activity: You should not put weight on your left lower extremity at any time. You should keep the splint on at all times. Do not get the splint weight and keep it clean. If the splint gets wet, it will fall apart.    Incision Care: Your incisions have a dressing and splint over it. You should leave the splint and dressings in place. Do not attempt to take apart the splint or remove the dressings. You have sutures in the incisions that will be taken out at your first office visit. Do not put anything down the splint to try to itch. Do not submerge (e.g., take a bath, swim, go in a hot tub, etc.) or put lotion/cream over the incisions until six weeks after surgery. There may be some bloody drainage from the incision into the dressing after surgery. This is normal. You do not need to replace the dressing. Continue to leave it in place for the one week as instructed above. Should the dressing become saturated with blood or drainage, please call the office for further instructions.   Medications: Aspirin has been prescribed to you to prevent blood clots. You should take 81mg  twice per day for a total of six weeks. You should refrain from using other blood thinners (warfarin, apixaban, plavix, xarelto, etc.) while using the aspirin. Oxycodone has been prescribed to you to take in addition to your normal amount of pain medication. Your chronic oxycontin was also prescribed. Your nursing facility or the original prescriber will need to prescribe this medication for your chronic pain going forward. You should not drink alcohol or operate heavy machinery (including driving) while taking the extra oxycodone. Narcotics, including oxycodone, can  cause constipation as a side effect. For that reason, you have been prescribed senna and miralax. You do not need to take these medications if you develop diarrhea. You can increase the miralax to twice per day if you remain constipated.   You should not use over-the-counter NSAIDs (ibuprofen, Aleve, Celebrex, naproxen, meloxicam, etc.) for pain relief because aspirin is a similar medication. There can be side effects including but not limited to kidney injury and ulcers if you take these type of medications with the aspirin.  In order to set expectations for opioid prescriptions, you will only be prescribed opioids for a total of six weeks after surgery and, at two-weeks after surgery, your opioid prescription will start to tapered (decreased dosage and number of pills). If you have ongoing need for opioid medication six weeks after surgery, you will be referred to pain management. If you are already established with a provider that is giving you opioid medications, you should schedule an appointment with them for six weeks after surgery if you feel you are going to need another prescription. State law only allows for opioid prescriptions one week at a time. If you are running out of opioid medication near the end of the week, please call the office during business hours before running out so I can send you another prescription.   Driving: You should not drive while taking narcotic pain medications. You will not be able to drive while in a splint so do not expect to be driving before six weeks. You should start getting back to driving slowly and you may want to  try driving in a parking lot before doing anything more.   Diet: You are safe to resume your regular diet after surgery.   Reasons to Call the Office After Surgery: You should feel free to call the office with any concerns or questions you have in the post-operative period, but you should definitely notify the office if you develop: -shortness of  breath, chest pain, or trouble breathing -excessive bleeding, drainage, redness, or swelling around the surgical site -fevers, chills, or pain that is getting worse with each passing day -persistent nausea or vomiting -other concerns about your surgery  Follow Up Appointments: You have a follow up appointment scheduled with Dr. Christell Constant on 03/29/2023 at 8:30am. Please show up on time. X-rays will be obtained at that visit. Your sutures will be removed and your splint will be replaced.  Office Information:  -Willia Craze, MD -Phone number: [phone] -Address: [address]       [place], [place] [address]

## 2023-03-10 NOTE — Anesthesia Procedure Notes (Signed)
Anesthesia Regional Block: Adductor canal block   Pre-Anesthetic Checklist: , timeout performed,  Correct Patient, Correct Site, Correct Laterality,  Correct Procedure, Correct Position, site marked,  Risks and benefits discussed,  Pre-op evaluation,  At surgeon's request and post-op pain management  Laterality: Left  Prep: Maximum Sterile Barrier Precautions used, chloraprep       Needles:  Injection technique: Single-shot  Needle Type: Echogenic Stimulator Needle     Needle Length: 9cm  Needle Gauge: 21     Additional Needles:   Procedures:,,,, ultrasound used (permanent image in chart),,    Narrative:  Start time: 03/10/2023 3:54 PM End time: 03/10/2023 3:59 PM Injection made incrementally with aspirations every 5 mL.  Performed by: Personally  Anesthesiologist: Gaynelle Adu, MD

## 2023-03-10 NOTE — Op Note (Signed)
Orthopedic Surgery Operative Report  Procedure: Open treatment of trimalleolar ankle fracture with fixation of the posterior lip, open treatment of syndesmosis disruption with internal fixation  Modifier: none  Date of procedure: 03/10/2023  Patient name: Kathleen [name] MRN: [id number] DOB: Jan 10, 1961  Surgeon: Willia Craze, MD Assistant: Karenann Cai, PA-C Pre-operative diagnosis: left trimalleolar ankle fracture Post-operative diagnosis: left trimalleolar ankle fracture with disruption of the syndesmosis Findings: left displaced trimalleolar ankle fracture, disruption of the syndesmosis seen after fixation of the medial and lateral malleolus fractures  Specimens: none Anesthesia: spinal and regional EBL: 25cc Complications: none Pre-incision antibiotic: ancef TXA given prior to incision as well  Implants:  Implant Name Type Inv. Item Serial No. Manufacturer Lot No. LRB No. Used Action  SCREW CORT 2.7X19 T8 ST EVOS - [id number] Screw SCREW CORT 2.7X19 T8 ST EVOS  [employer]  Left 2 Implanted  PLATE L.DISTAL 2.7/3.5 7H EVOS - [id number] Plate PLATE L.DISTAL 2.7/3.5 7H EVOS  [employer]  Left 1 Implanted  SCREW CORT EVOS ST 3.5X12 - [id number] Screw SCREW CORT EVOS ST 3.5X12  [employer]  Left 4 Implanted  SCREW LOCK 2.7X8 - [id number] Screw SCREW LOCK 2.7X8  [employer]  Left 1 Implanted  SCREW EVOS 2.7X11 LOCK T8 - [id number] Screw SCREW EVOS 2.7X11 LOCK T8  [employer]  Left 1 Implanted  SCREW CORT 2.7X10 STAR T8 EVOS - [id number] Screw SCREW CORT 2.7X10 STAR T8 EVOS  [employer]  Left 1 Implanted  2.7x7 Locking screw    [employer]  Left 1 Implanted  SCREW LOCK 2.7X13 ST EVOS - [id number] Screw SCREW LOCK 2.7X13 ST EVOS  [employer]  Left 1 Implanted  SCREW CORT 2.7X14 T8 EVOS - [id number] Screw SCREW CORT 2.7X14 T8 EVOS  [employer]  Left 1 Implanted  SCREW CANN PT STD 4X44 - [id number] Screw SCREW CANN PT STD 4X44  [employer]  Left 1 Implanted  SCREW CANN 4.0 - [id number] Screw SCREW CANN 4.0  [employer]  Left 1 Implanted  KIT INVISIKNOT ANKLE FRACTURE - [id number] Screw KIT INVISIKNOT ANKLE FRACTURE  [employer] [id number] Left 1 Implanted      Indication for procedure: Patient is a 62 y.o. female who presented to [facility] ER after a ground level fall at home after which she noted left ankle pain. She was diagnosed with a left ankle fracture dislocation at [facility]. An attempted reduction was performed in the ER by ER staff. She was then transferred to [facility] on a medicine service. Given the unstable and displaced nature of the fracture, operative fixation was recommended to the patient. The risks including but not limited to: dvt/pe, infection, stiffness, posttraumatic arthritis, bleeding, hardware failure, nonunion, malunion, need for additional procedures, and death. After covering the risks, benefits, and alternatives of surgical management, the patient elected to proceed.   Procedure Description: The patient was met in the pre-operative holding area. The patient's identity and consent were verified. The operative leg was marked. The patient's remaining questions about the surgery were answered. A regional block was performed by anesthesia. The patient was brought back to the operating room. A spinal anesthetic was performed by anesthesia. The patient was transferred to the OR table in the supine position. All bony prominences were well padded. The surgical area was cleansed with a CHX brush and  then alcohol. The patient's skin was then prepped and draped in a standard, sterile fashion. A time out was performed that identified the patient, the procedure, and the laterality. All team members agreed with what was stated in the time out.   A  longitudinal incision was made in line with the fibular from the distal most aspect to the a couple of centimeters cranial to the fracture site. Incision was taken sharply down to bone at the distal aspect of the fibular. More cranially, blunt dissection was performed with a tenotomy scissors. The superficial peroneal nerve was not visualized. Dissection was carried down to bone bluntly with the scissors. The fracture was well visualized. Irrigation and suction was used to clear the hematoma out of the fracture site. A 15 blade was used to clear the fractures edges of periosteum and soft tissue. A standard reduction clamp was used to reduce the fibular and hold the reduction. The fibular appeared well reduced on inspection. Plan was made for lag screws. A 2.7 mm drill was used to overdrill the near cortex of the fibular on the anterior cortex. A 2.5mm drill was then used to drill the far cortex. A depth gauge was used the estimate the screw length. A 2.68mm screw of that length was then placed perpendicular to the fracture site. The same process was repeated to place another 2.67mm lag screw. A distal fibular plate was then selected and applied to the lateral aspect of the fibula. It appeared to be in an appropriate position. A drill was used to drill a hole through the plate just proximal fracture site. A depth gauge was used to estimate the screw length. That length 3.44mm cortical screw was then placed through the drilled hole. This process was repeated to place two more 3.7mm cortical screws proximal to the fracture. A drill was used to drill a hole distal to the fracture site around the lag screws. This hole was drilled bicortically. A depth gauge was used to estimate the screw length. That length 3.66mm cortical screw was then inserted.   Next, distal locking screws were placed. The locking guide was placed into the hole and a drill was used to drill unicortically. A depth gauge was placed into the hole to  estimate the screw length. That length screw was then inserted and locked into the plate. The same process was repeated to insert a total to six locking screws distally. AP and lateral fluoroscopic images were taken that confirmed satisfactory reduction and appropriate position of the lateral fixation.   A longitudinal incision was then made over the medial malleolus and taken sharply down to bone. Irrigation and suction was used to clear the hematoma out of the fracture site. A 15 blade knife was used to clear the fracture edges off of soft tissue and periosteum. A dental pick was used to reduce the fracture. A pointed reduction clamp was then used to hole the reduction. 2 K wires were then placed into the medial malleolus from the tip of the malleolus into the tibia just proximal to the fracture. A depth gauge was placed over the K wires and the length of each screw was estimated. A cannulated drill was used to drill over one of the K wires. A cannulated, partially threaded screw was then inserted over that K wire. The cannulated drill was then used over the second K wire. Then, another partially threaded cannulated screw was inserted over the wire. The wires were removed. AP and lateral fluoroscopic images confirmed  satisfactory reduction and placement of the medial fixation.   A manual stress test was performed and a fluoroscopic image was obtained which showed medial clear space. For that reason, decision was made to perform syndesmotic fixation. The a 15 blade was used to dissect tissue off of the anterior fibula to visualize the distal tibiofibular joint. The joint was reduced and a 2.0 K wire was placed from the fibula into the tibia to hold the reduction. A Beath pin was then used to drill through a hole in the plate parallel to the tibiatalar joint across both cortices of the fibula and the tibia. The Beath pin was seen tenting the skin medially. A separate incision was made over the distal tibia over  the Beath pin. The Beath pin was then passed out the more proximal incision over the tibia. An invisaknot was then passed across the syndesmosis using the Beath pin. The button was then rotated to sit flush on the tibia. The invisaknot was then tightened down and locked in place with 6 knots. The 2.0 K wire was then removed. Final AP, mortise, and lateral fluoroscopic images were obtained which showed satisfactory reduction and appropriate position of the fixation.    The wound was copiously irrigated with sterile saline. The deep dermal layer was reapproximated with 2-0 vicryl on both the medial and lateral sides. The skin was closed with 3-0 nylon. All counts were correct at the end of the case. The incision was dressed with adaptic and gauze. An ABD was placed over the heel and medial and lateral malleolus. Webril was wrapped around the gauze and ABDs. A well-padded short leg plaster splint was then applied to the left lower extremity. The patient was transferred back to a bed and brought to the post-anesthesia care unit by anesthesia staff in stable condition.  Post-operative plan: The patient will recover in the post-anesthesia care unit and then go to the floor on a medicine service. The patient will receive two post-operative doses of ancef. The patient will be non-weight bearing on the left lower extremity in the splint. The patient will work with physical therapy. The patient's disposition will likely discharge to a SNF.      Willia Craze, MD Orthopedic Surgeon

## 2023-03-10 NOTE — Progress Notes (Signed)
0865 Medication orders for nebulizer treatments not confirmed by pharmacy yet, unable to administer treatment at this time,

## 2023-03-10 NOTE — Consult Note (Addendum)
Orthopedic Surgery Consult Note  Assessment: Patient is a 62 y.o. female with left trimalleolar ankle fracture/dislocation   Plan: -Planning for operative stabilization today -Diet: NPO for procedure -DVT ppx: aspirin 81mg  BID post-operatively -Antibiotics: ancef on call to OR -Weight bearing status: NWB LLE -PT/OT evaluate and treat post-op -Pain control -Dispo: pending completion of operative plans   Discussed recommendation for operative intervention in the form of left ankle fracture ORIF. Explained the risks of this procedure included, but were not limited to: nonunion, malunion, hardware failure, infection, bleeding, stiffness, posttraumatic arthritis, need for additional procedures, deep vein thrombosis, pulmonary embolism, and death. I explained that the risks would be higher given her significant medical comorbidities. She also has an elevated BMI at 36.5 and chronic steroid use. I also discussed that there is a chance that she has persistent pain in the ankle after surgery given her chronic pain and pre-operative, chronic use of narcotics. The benefits of this procedure would be to promote fracture healing by providing stability and to heal the fracture in the appropriate alignment. The alternatives of this surgery would be to treat the fracture with immobilization in a splint/brace/cast, to attempt another reduction and then immobilize, or to do no intervention. The patient's questions were answered to her satisfaction. After this discussion, patient elected to proceed with surgery. Informed consent was obtained.   ___________________________________________________________________________   Chief complaint: left ankle pain  History:  Patient is a 62 y.o. female who had a ground level fall getting out of bed this morning around 4am. Noted immediate onset of left ankle pain. Was brought to [facility] by EMS. She was found to have a left ankle fracture dislocation. An  attempted reduction was performed by the emergency department. A splint was placed. [facility] ED attending called about the patient after the reduction and requested orthopedic input. She was transferred to [facility]. Still reporting left ankle pain. No pain elsewhere. Has bilateral numbness and paresthesias in her feet due to neuropathy. No change in the numbness/paresthesias.   Of note, has chronic pain and is on opioids at baseline and is on home O2.    Physical Exam:  General: no acute distress, appears stated age Neurologic: alert, answering questions appropriately, following commands Cardiovascular: regular rate, no cyanosis Respiratory: unlabored breathing on room air, symmetric chest rise Psychiatric: appropriate affect, normal cadence to speech  MSK:   -Bilateral upper extremities  No tenderness to palpation over extremity, no gross deformity, no open wounds Fires deltoid, biceps, triceps, wrist extensors, wrist flexors, finger extensors, finger flexors  AIN/PIN/IO intact  Palpable radial pulse  Sensation intact to light touch in median/ulnar/radial/axillary nerve distributions  Hand warm and well perfused  -Right lower extremity  No tenderness to palpation over extremity, no gross deformity, no open wounds Fires hip flexors, quadriceps, hamstrings, tibialis anterior, gastrocnemius and soleus, extensor hallucis longus Plantarflexes and dorsiflexes toes Sensation intact to light touch in sural, saphenous, tibial, deep peroneal, and superficial peroneal nerve distributions Foot warm and well perfused  -Left lower extremity  No tenderness to palpation over extremity, except over the  ankle, splint in place over the ankle, no open wounds seen but splint obstructing view  No pain with log roll Fires hip flexors, quadriceps, hamstrings. Does not fire tibialis anterior, gastrocnemius and soleus due to pain. Fires EHL. Is able to dorsiflex and plantarflex toes Sensation intact to  light touch over dorsal and plantar aspect of toes Foot warm and well perfused   Imaging: XR  of the left ankle from 03/10/2023 was independently reviewed and interpreted, showing a displaced trimalleolar ankle fracture. The ankle joint is dislocated. Long oblique pattern to the fibular fracture. Small posterior fracture of the posterior malleolus. Transverse medial malleolus fracture. All fracture fragments are displaced. No other fractures seen.    Patient name: Kathleen [name] Patient MRN: [id number] Date: 03/10/23

## 2023-03-10 NOTE — Anesthesia Procedure Notes (Signed)
Anesthesia Regional Block: Popliteal block   Pre-Anesthetic Checklist: , timeout performed,  Correct Patient, Correct Site, Correct Laterality,  Correct Procedure, Correct Position, site marked,  Risks and benefits discussed,  Pre-op evaluation,  At surgeon's request and post-op pain management  Laterality: Left  Prep: Maximum Sterile Barrier Precautions used, chloraprep       Needles:  Injection technique: Single-shot  Needle Type: Echogenic Stimulator Needle     Needle Length: 9cm  Needle Gauge: 21     Additional Needles:   Procedures:,,,, ultrasound used (permanent image in chart),,    Narrative:  Start time: 03/10/2023 3:43 PM End time: 03/10/2023 3:53 PM Injection made incrementally with aspirations every 5 mL.  Performed by: Personally  Anesthesiologist: Gaynelle Adu, MD

## 2023-03-10 NOTE — Anesthesia Procedure Notes (Signed)
Spinal  Patient location during procedure: OR Start time: 03/10/2023 4:32 PM End time: 03/10/2023 4:37 PM Reason for block: surgical anesthesia Staffing Performed: anesthesiologist  Anesthesiologist: Gaynelle Adu, MD Performed by: Gaynelle Adu, MD Authorized by: Gaynelle Adu, MD   Preanesthetic Checklist Completed: patient identified, IV checked, risks and benefits discussed, surgical consent, monitors and equipment checked, pre-op evaluation and timeout performed Spinal Block Patient position: sitting Prep: DuraPrep Patient monitoring: cardiac monitor, continuous pulse ox and blood pressure Approach: midline Location: L3-4 Injection technique: single-shot Needle Needle type: Quincke  Needle gauge: 22 G Needle length: 9 cm Assessment Sensory level: T8 Events: CSF return Additional Notes Functioning IV was confirmed and monitors were applied. Sterile prep and drape, including hand hygiene and sterile gloves were used. The patient was positioned and the spine was prepped. The skin was anesthetized with lidocaine.  Free flow of clear CSF was obtained prior to injecting local anesthetic into the CSF.  The spinal needle aspirated freely following injection.  The needle was carefully withdrawn.  The patient tolerated the procedure well.

## 2023-03-10 NOTE — Progress Notes (Signed)
Patient arrived to PACU bay 13 from OR at 70. Patient asleep in no distress.  1935 Dr. Willia Craze in PACU to round on patient. Informed of patients increased blood pressure. He was not concerned with her blood pressure at this time. Discussed patients respiratory status and he explained that her baseline sounded as wheezy as she did at this time. No new orders given. 1940  Dr. Gaynelle Adu in to round on patient. Notified of increased blood pressure and he gave order to give 5mg  of Hydralazine IV, one dose for SBD over 100. While Dr, Sampson Goon was at bedside the next blood pressure cycled and it was 162/98. He stated " don't give it now. Don't give unless her SBP sustains over 100. 1955 Call placed to Dr. Gaynelle Adu to obtain order to give albuterol nebulizer treatment as patient was stating she "feels like she can't breathe:. O2 Sats were 98-100% on 4L [place] at that time. Order given and administered as orders. 2010 Report called to [place], RN on [place]. Relayed previous information. 2030 prepared patient for transfer back to her room on 5N. Pt sleeping intermittently and vital signs are stable. No distress noted.

## 2023-03-10 NOTE — ED Triage Notes (Signed)
Pt bib EMS after she fell this morning at home. Pt c/o L ankle pain-obvious swelling and deformity noted. Pt wheezing when EMS arrived (states she has been wheezing more than normal the past few days). Pt with hx of Pulmonary Fibrosis and Sarcoidosis and is on O2 @ 4L continuously. EMS gave pt an Albuterol treatment and of Fentanyl en route.

## 2023-03-10 NOTE — Brief Op Note (Signed)
03/10/2023  6:42 PM  PATIENT:  Kathleen [name]  62 y.o. female  PRE-OPERATIVE DIAGNOSIS:  Left Ankle fracture  POST-OPERATIVE DIAGNOSIS:  Left Ankle fracture  PROCEDURE:  Procedure(s): OPEN REDUCTION INTERNAL FIXATION (ORIF) ANKLE FRACTURE (Left)  SURGEON:  Surgeons and Role:    [name], MD - Primary  PHYSICIAN ASSISTANT: Karenann Cai, PA-C  ASSISTANTS: Karenann Cai, PA-C   ANESTHESIA:   regional and spinal  EBL:  25 mL   BLOOD ADMINISTERED:none  DRAINS: none   LOCAL MEDICATIONS USED:  NONE  SPECIMEN:  No Specimen  DISPOSITION OF SPECIMEN:  N/A  COUNTS:  YES  TOURNIQUET:  None  DICTATION: .Note written in EPIC  PLAN OF CARE: Admit to inpatient   PATIENT DISPOSITION:  PACU - hemodynamically stable.   Delay start of Pharmacological VTE agent (>24hrs) due to surgical blood loss or risk of bleeding: no

## 2023-03-10 NOTE — Anesthesia Postprocedure Evaluation (Signed)
Anesthesia Post Note  Patient: MALIJAH RUBERT  Procedure(s) Performed: OPEN REDUCTION INTERNAL FIXATION (ORIF) ANKLE FRACTURE (Left: Ankle)     Patient location during evaluation: PACU Anesthesia Type: Regional and Spinal Level of consciousness: oriented and awake and alert Pain management: pain level controlled Vital Signs Assessment: post-procedure vital signs reviewed and stable Respiratory status: spontaneous breathing, respiratory function stable and patient connected to nasal cannula oxygen Cardiovascular status: blood pressure returned to baseline and stable Postop Assessment: no headache, no backache, no apparent nausea or vomiting, spinal receding and patient able to bend at knees Anesthetic complications: no  No notable events documented.  Last Vitals:  Vitals:   03/10/23 2010 03/10/23 2030  BP: (!) 165/89 (!) 168/89  Pulse: 93 95  Resp: 10 11  Temp:  36.7 C  SpO2: 100% 97%    Last Pain:  Vitals:   03/10/23 2010  TempSrc:   PainSc: 0-No pain                 Sohrab Keelan,W. EDMOND

## 2023-03-10 NOTE — Anesthesia Preprocedure Evaluation (Addendum)
Anesthesia Evaluation  Patient identified by MRN, date of birth, ID band Patient awake    Reviewed: Allergy & Precautions, H&P , NPO status , Patient's Chart, lab work & pertinent test results  History of Anesthesia Complications (+) PONV and history of anesthetic complications  Airway Mallampati: III  TM Distance: >3 FB Neck ROM: Full    Dental no notable dental hx. (+) Teeth Intact, Dental Advisory Given   Pulmonary shortness of breath, COPD,  COPD inhaler and oxygen dependent    + wheezing      Cardiovascular negative cardio ROS  Rhythm:Regular Rate:Normal     Neuro/Psych   Anxiety Depression    negative neurological ROS     GI/Hepatic Neg liver ROS,GERD  Medicated,,  Endo/Other  Hypothyroidism    Renal/GU negative Renal ROS  negative genitourinary   Musculoskeletal  (+)  Fibromyalgia -  Abdominal   Peds  Hematology  (+) Blood dyscrasia, anemia   Anesthesia Other Findings   Reproductive/Obstetrics negative OB ROS                             Anesthesia Physical Anesthesia Plan  ASA: 3  Anesthesia Plan: Spinal   Post-op Pain Management: Regional block* and Ofirmev IV (intra-op)*   Induction: Intravenous  PONV Risk Score and Plan: 4 or greater and Ondansetron, Dexamethasone, Midazolam and Propofol infusion  Airway Management Planned: Natural Airway and Simple Face Mask  Additional Equipment:   Intra-op Plan:   Post-operative Plan:   Informed Consent: I have reviewed the patients History and Physical, chart, labs and discussed the procedure including the risks, benefits and alternatives for the proposed anesthesia with the patient or authorized representative who has indicated his/her understanding and acceptance.     Dental advisory given  Plan Discussed with: CRNA  Anesthesia Plan Comments:        Anesthesia Quick Evaluation

## 2023-03-10 NOTE — ED Notes (Signed)
Stirrup split placed after reduction.

## 2023-03-10 NOTE — Transfer of Care (Signed)
Immediate Anesthesia Transfer of Care Note  Patient: KAHMORA KOELLING  Procedure(s) Performed: OPEN REDUCTION INTERNAL FIXATION (ORIF) ANKLE FRACTURE (Left: Ankle)  Patient Location: PACU  Anesthesia Type:Regional and Spinal  Level of Consciousness: drowsy  Airway & Oxygen Therapy: Patient Spontanous Breathing and Patient connected to face mask oxygen  Post-op Assessment: Report given to RN and Post -op Vital signs reviewed and stable  Post vital signs: Reviewed and stable  Last Vitals:  Vitals Value Taken Time  BP 157/98 03/10/23 1915  Temp 35.9 C 03/10/23 1910  Pulse 96 03/10/23 1918  Resp 11 03/10/23 1918  SpO2 96 % 03/10/23 1918  Vitals shown include unfiled device data.  Last Pain:  Vitals:   03/10/23 1500  TempSrc:   PainSc: 7       Patients Stated Pain Goal: 2 (03/10/23 1430)  Complications: No notable events documented.

## 2023-03-10 NOTE — Anesthesia Procedure Notes (Signed)
Procedure Name: MAC Date/Time: 03/10/2023 4:30 PM  Performed by: Laruth Bouchard., CRNAPre-anesthesia Checklist: Patient identified, Emergency Drugs available, Suction available, Patient being monitored and Timeout performed Patient Re-evaluated:Patient Re-evaluated prior to induction Oxygen Delivery Method: Simple face mask Preoxygenation: Pre-oxygenation with 100% oxygen Induction Type: IV induction Placement Confirmation: positive ETCO2

## 2023-03-10 NOTE — Progress Notes (Signed)
Patient arrived to 5N08 from [facility] with [facility] at 1400. GCS 15 on 4L [place]. Husband at bedside. CHG wipe given and skin assessment completed. Bed in lowest position and call bell within reach. Oliver Barre, RN

## 2023-03-11 ENCOUNTER — Telehealth: Payer: Self-pay

## 2023-03-11 ENCOUNTER — Inpatient Hospital Stay (HOSPITAL_COMMUNITY): Payer: HMO

## 2023-03-11 ENCOUNTER — Encounter (HOSPITAL_COMMUNITY): Payer: Self-pay | Admitting: Orthopedic Surgery

## 2023-03-11 DIAGNOSIS — S82842S Displaced bimalleolar fracture of left lower leg, sequela: Secondary | ICD-10-CM | POA: Diagnosis not present

## 2023-03-11 LAB — URINALYSIS, W/ REFLEX TO CULTURE (INFECTION SUSPECTED)
Bacteria, UA: NONE SEEN
Bilirubin Urine: NEGATIVE
Glucose, UA: 50 mg/dL — AB
Hgb urine dipstick: NEGATIVE
Ketones, ur: NEGATIVE mg/dL
Leukocytes,Ua: NEGATIVE
Nitrite: NEGATIVE
Protein, ur: NEGATIVE mg/dL
Specific Gravity, Urine: 1.005 (ref 1.005–1.030)
pH: 6 (ref 5.0–8.0)

## 2023-03-11 LAB — CBC
HCT: 29.1 % — ABNORMAL LOW (ref 36.0–46.0)
Hemoglobin: 8.8 g/dL — ABNORMAL LOW (ref 12.0–15.0)
MCH: 28.2 pg (ref 26.0–34.0)
MCHC: 30.2 g/dL (ref 30.0–36.0)
MCV: 93.3 fL (ref 80.0–100.0)
Platelets: 372 10*3/uL (ref 150–400)
RBC: 3.12 MIL/uL — ABNORMAL LOW (ref 3.87–5.11)
RDW: 14.4 % (ref 11.5–15.5)
WBC: 12.5 10*3/uL — ABNORMAL HIGH (ref 4.0–10.5)
nRBC: 0.2 % (ref 0.0–0.2)

## 2023-03-11 LAB — BASIC METABOLIC PANEL
Anion gap: 15 (ref 5–15)
BUN: 12 mg/dL (ref 6–20)
CO2: 27 mmol/L (ref 22–32)
Calcium: 8.8 mg/dL — ABNORMAL LOW (ref 8.9–10.3)
Chloride: 95 mmol/L — ABNORMAL LOW (ref 98–111)
Creatinine, Ser: 1.04 mg/dL — ABNORMAL HIGH (ref 0.44–1.00)
GFR, Estimated: >60 mL/min (ref >60)
Glucose, Bld: 256 mg/dL — ABNORMAL HIGH (ref 70–99)
Potassium: 4.6 mmol/L (ref 3.5–5.1)
Sodium: 137 mmol/L (ref 135–145)

## 2023-03-11 MED ORDER — HYDROCORTISONE 10 MG PO TABS
10.0000 mg | ORAL_TABLET | Freq: Every day | ORAL | Status: DC
  Administered 2023-03-12 – 2023-03-13 (×2): 10 mg via ORAL
  Filled 2023-03-11 (×4): qty 1

## 2023-03-11 MED ORDER — HYDROCORTISONE 10 MG PO TABS: 10.0000 mg | ORAL_TABLET | Freq: Every day | ORAL | Status: DC

## 2023-03-11 MED ORDER — HYDROCORTISONE 20 MG PO TABS: 20.0000 mg | ORAL_TABLET | Freq: Every day | ORAL | Status: DC

## 2023-03-11 MED ORDER — CHLORHEXIDINE GLUCONATE CLOTH 2 % EX PADS
6.0000 | MEDICATED_PAD | Freq: Every day | CUTANEOUS | Status: DC
  Administered 2023-03-11 – 2023-03-14 (×4): 6 via TOPICAL

## 2023-03-11 MED ORDER — LORAZEPAM 2 MG/ML IJ SOLN
0.5000 mg | INTRAMUSCULAR | Status: AC | PRN
  Administered 2023-03-11: 0.5 mg via INTRAVENOUS
  Filled 2023-03-11: qty 1

## 2023-03-11 MED ORDER — HYDROCORTISONE 20 MG PO TABS
20.0000 mg | ORAL_TABLET | Freq: Every day | ORAL | Status: DC
  Administered 2023-03-12 – 2023-03-14 (×3): 20 mg via ORAL
  Filled 2023-03-11 (×3): qty 1

## 2023-03-11 MED ORDER — PREDNISONE 10 MG PO TABS
10.0000 mg | ORAL_TABLET | Freq: Every morning | ORAL | Status: DC
  Administered 2023-03-12 – 2023-03-14 (×3): 10 mg via ORAL
  Filled 2023-03-11 (×3): qty 1

## 2023-03-11 MED ORDER — NYSTATIN 100000 UNIT/GM EX POWD
Freq: Two times a day (BID) | CUTANEOUS | Status: DC
  Filled 2023-03-11 (×3): qty 15

## 2023-03-11 NOTE — Telephone Encounter (Signed)
So I am aware that she is in the hospital She should consider short-term stay with rehab center if it is offered to her unless she is 100% confident that she can take care of herself at home

## 2023-03-11 NOTE — TOC Initial Note (Signed)
Transition of Care [facility]) - Initial/Assessment Note    Patient Details  Name: Kathleen [name] MRN: [id number] Date of Birth: Dec 28, 1960  Transition of Care [facility]) CM/SW Contact:    Ronny Bacon, RN Phone Number: 03/11/2023, 1:51 PM  Clinical Narrative:  Spoke with patient at bedside. Patient lives at home with husband who is her caregiver. Husband is scheduled for knee surgery on 03/12/2023 at [facility]. Patient requests if husband needs to go to a facility after surgery, she wants to go to same facility and share room with him.  Patient has RW, electric scooter, shower chair, BSC and WC at home. WC does not fit in doorways within the home. Her home O2 is through [employer].      TOC will follow for discharge needs        Expected Discharge Plan: IP Rehab Facility Barriers to Discharge: Continued Medical Work up   Patient Goals and CMS Choice Patient states their goals for this hospitalization and ongoing recovery are:: To go home if husband is able to go home after his surgery on 03/12/2023. If not, wants to go to same facility husband goes to after his surgery.          Expected Discharge Plan and Services   Discharge Planning Services: CM Consult   Living arrangements for the past 2 months: Single Family Home                                      Prior Living Arrangements/Services Living arrangements for the past 2 months: Single Family Home Lives with:: Spouse Patient language and need for interpreter reviewed:: Yes Do you feel safe going back to the place where you live?: Yes (If husband will be able to care for her)      Need for Family Participation in Patient Care: Yes (Comment) Care giver support system in place?: Yes (comment) (husband having knee surgery 03/12/2023 at [facility]) Current home services: DME (RW, electric scooter, shower chair, BSC, WC) Criminal Activity/Legal Involvement Pertinent to Current Situation/Hospitalization: No - Comment as  needed  Activities of Daily Living Home Assistive Devices/Equipment: Art gallery manager, Nebulizer, Oxygen, Shower chair with back, Bedside commode/3-in-1 ADL Screening (condition at time of admission) Patient's cognitive ability adequate to safely complete daily activities?: Yes Is the patient deaf or have difficulty hearing?: No Does the patient have difficulty seeing, even when wearing glasses/contacts?: No Does the patient have difficulty concentrating, remembering, or making decisions?: No Patient able to express need for assistance with ADLs?: Yes Does the patient have difficulty dressing or bathing?: Yes Independently performs ADLs?: No Communication: Independent Dressing (OT): Needs assistance Is this a change from baseline?: Change from baseline, expected to last >3 days Grooming: Needs assistance Is this a change from baseline?: Change from baseline, expected to last <3 days Feeding: Independent Bathing: Needs assistance Is this a change from baseline?: Change from baseline, expected to last >3 days Toileting: Needs assistance Is this a change from baseline?: Change from baseline, expected to last >3days In/Out Bed: Needs assistance, Dependent (Able to dangle independent) Is this a change from baseline?: Change from baseline, expected to last >3 days Walks in Home: Needs assistance, Dependent Is this a change from baseline?: Change from baseline, expected to last >3 days (Needs assistance able to pivot at home with help) Does the patient have difficulty walking or climbing stairs?: Yes Weakness of Legs: Both Weakness  of Arms/Hands: Both  Permission Sought/Granted Permission sought to share information with : Case Manager Permission granted to share information with : Yes, Verbal Permission Granted              Emotional Assessment Appearance:: Appears stated age Attitude/Demeanor/Rapport: Engaged Affect (typically observed): Appropriate Orientation: : Oriented to  Self, Oriented to Place, Oriented to  Time, Oriented to Situation Alcohol / Substance Use: Not Applicable Psych Involvement: No (comment)  Admission diagnosis:  Pulmonary fibrosis (HCC) [J84.10] Closed fracture of left ankle, initial encounter [S82.892A] Ankle fracture, bimalleolar, closed, left, sequela [S82.842S] Patient Active Problem List   Diagnosis Date Noted   Ankle fracture, bimalleolar, closed, left, sequela 03/10/2023   Opioid dependence (HCC) 03/10/2023   Closed fracture of left ankle 03/10/2023   Syndesmotic disruption of ankle, left, initial encounter 03/10/2023   Lumbar transverse process fracture (HCC) 01/02/2023   Acute on chronic hypoxic respiratory failure (HCC) 12/13/2022   Depression, major, single episode, moderate (HCC) 09/10/2022   Aortic atherosclerosis (HCC) 09/10/2022   Chronic respiratory failure with hypoxia (HCC) 05/22/2021   Anxiety and depression 05/22/2021   Vitamin D deficiency 05/09/2021   Left foot drop 01/09/2021   Pulmonary HTN (HCC) 10/12/2020   Encounter for long-term opiate analgesic use 02/02/2020   Interstitial lung disease (HCC) 04/14/2019   GERD (gastroesophageal reflux disease) 10/15/2017   Personal history of noncompliance with medical treatment, presenting hazards to health 04/26/2017   Hypothyroidism 01/15/2017   Adrenal insufficiency (Addison's disease) (HCC) 08/22/2016   Symptomatic bradycardia    Hypokalemia 08/17/2016   Chronic pain syndrome 04/20/2016   Irritable bowel syndrome with constipation 11/14/2015   Avascular necrosis of bone of hip, left (HCC) 04/05/2015   Post herpetic neuralgia 10/22/2014   Mixed hyperlipidemia 08/25/2014   Obstructive sleep apnea 02/18/2014   Class 2 severe obesity due to excess calories with serious comorbidity and body mass index (BMI) of 38.0 to 38.9 in adult [facility]) 05/06/2013   Fibromyalgia 02/11/2013   Hypotension 01/16/2013   Fibrocystic breast disease in female 04/09/2012   Sarcoidosis  06/21/2008   PCP:  [name], MD Pharmacy:   [facility] [phone] - [place], [place] - [address] AT [facility] [address] [place] [address] Phone: [phone] Fax: [phone]  [place] [facility] [address] [place] [address] Phone: [phone] Fax: [phone]  [place] [facility] [address] [place] [address] Phone: [phone] Fax: [phone]     Social Determinants of Health (SDOH) Social History: SDOH Screenings   Food Insecurity: No Food Insecurity (03/10/2023)  Housing: Low Risk  (03/10/2023)  Transportation Needs: No Transportation Needs (03/10/2023)  Utilities: Not At Risk (03/10/2023)  Alcohol Screen: Low Risk  (09/21/2022)  Depression (PHQ2-9): Low Risk  (09/21/2022)  Recent Concern: Depression (PHQ2-9) - Medium Risk (08/22/2022)  Financial Resource Strain: Low Risk  (09/21/2022)  Physical Activity: Inactive (09/21/2022)  Social Connections: Socially Integrated (09/21/2022)  Stress: No Stress Concern Present (01/14/2023)  Tobacco Use: Low Risk  (03/10/2023)   SDOH Interventions:     Readmission Risk Interventions     No data to display

## 2023-03-11 NOTE — Progress Notes (Signed)
  Progress Note   Patient: Kathleen [name] JYN:[id number] DOB: 31-Oct-1960 DOA: 03/10/2023     1 DOS: the patient was seen and examined on 03/11/2023   Brief hospital course: 62 year old female with a history of chronic respiratory failure on 4 L, fibromyalgia, anxiety/depression, opioid dependence, adrenal insufficiency, hypothyroidism, sarcoidosis, pulmonary fibrosis presenting with a mechanical fall when she was getting out of bed in the early a.m. 03/10/2023.  She had pain in the left leg and was unable to get up.  EMS was activated but the patient to the emergency department.  X-ray showed an acute closed bimalleolar fracture with lateral dislocation of the left ankle.   In addition, the patient states that she has been a little more short of breath than usual for the past 2 days.  She complains of a largely nonproductive cough.  She denies any fevers, chills, chest pain, nausea, vomiting or abdominal pain.  There is no hemoptysis, there is no hematochezia or melena.  At baseline, the patient is on 4 L nasal cannula.  She has never smoked.   In the ED, the patient was afebrile and hemodynamically stable with oxygen saturation 94-95% on 4 L.  WBC 14.9, hemoglobin 10.5, platelets 401,000.  Sodium 136, potassium 3.5, bicarbonate 30, serum creatinine 0.8.  Chest x-ray showed chronic interstitial changes and distorted architecture without any acute findings.  Assessment and Plan: Closed left bimalleolar ankle fracture -Orthopedic Surgery consulted, now s/p surgery 7/14 -cont analgesia as needed -Therapy recs for SNF noted    Interstitial lung disease/sarcoidosis exacerbation -covid neg -cont Brovana and Pulmicort -Continue DuoNebs -02/26/2023 CTA chest negative for PE; scattered scarring and bronchiectasis -improved with solumedrol. Will wean steroids down to baseline 5mg  prednisone   Chronic respiratory failure with hypoxia -Chronically on 4 L nasal cannula   Adrenal insufficiency -Patient  chronically on hydrocortisone--20 mg a.m., 10 mg at lunch -On solumedrol, gradually wean steroids back to baseline steroids   Opioid dependence/chronic pain -PDMP reviewed -oxycontin ER 40 mg, #28, last refill 03/01/2023 -Oxy IR 5 mg, #56, last refill 03/01/2023   Anxiety/depression/fibromyalgia -Resume escitalopram and hydroxyzine   Hyperlipidemia -Continue statin   Class II obesity -BMI 36.54 -Lifestyle modification   Hypothyroidism -Continue levothyroxine   Subjective: Complained of feeling anxious with upcoming MRI this AM  Physical Exam: Vitals:   03/11/23 0827 03/11/23 0828 03/11/23 0840 03/11/23 1359  BP:    105/61  Pulse:    86  Resp:    18  Temp:    97.8 F (36.6 C)  TempSrc:      SpO2: 97% 97% 98% 96%  Weight:      Height:       General exam: Awake, laying in bed, in nad Respiratory system: Normal respiratory effort, no wheezing Cardiovascular system: regular rate, s1, s2 Gastrointestinal system: Soft, nondistended, positive BS Central nervous system: CN2-12 grossly intact, strength intact Extremities: Perfused, no clubbing Skin: Normal skin turgor, no notable skin lesions seen Psychiatry: Mood normal // no visual hallucinations   Data Reviewed:  Labs reviewed: Na 137, K 4.6, Cr 1.04, WBC 12.6, Hgb 8.8, Plts 372  Family Communication: Pt in room, family at bedside  Disposition: Status is: Inpatient Remains inpatient appropriate because: Severity of illness  Planned Discharge Destination: Skilled nursing facility     Author: Rickey Barbara, MD 03/11/2023 4:23 PM  For on call review [email or web address].

## 2023-03-11 NOTE — Telephone Encounter (Signed)
Pt is at [facility] she fell Saturday broke her leg in three different places and has to have surgery last night around 5 the same group that handel her back is same ones dis her surgery on her leg.   Pt is going have MRI now that she is at the hospital and she don't if she is going home or rehab.

## 2023-03-11 NOTE — Progress Notes (Signed)
   03/11/23 1638  Spiritual Encounters  Type of Visit Initial  Care provided to: Patient;Significant other  Referral source Nurse (RN/NT/LPN);Clinical staff  Reason for visit Routine spiritual support  OnCall Visit No   Chap responded to a spiritual consult to visit Pt and provide spiritual support. Mirna Mires was able to visit Pt who was accompanied by her husband. Pt and Mirna Mires had a long conversation about Pt's plans for her life and her need to reconfigure some of her plans to face the future. Pt reflected about being grateful for the many things in life, the blessing of having her family close living close to her. Pt also remembered her Allena Earing, whose funeral happened last Saturday. Aunt Delorise Shiner was of great significance for Pt. Pt and Chap had a moment to reflect about life, faith and family. Pt requested prayer and Mirna Mires prayed with Pt and husband. Mirna Mires remains available if needed.

## 2023-03-11 NOTE — Progress Notes (Addendum)
Orthopedic Surgery Progress Note   Assessment: Patient is a 62 y.o. female with left trimalleolar ankle fracture and syndesmotic disruption status post ORIF   Plan: -Operative plans: complete -Diet: regular -DVT ppx: heparin, will switch to aspirin 81mg  BID at discharge -Antibiotics: ancef x2 post-op doses -Weight bearing status: non-weight bearing left lower extremity -Ordered MRI since is now unable to get the one that was previously ordered -PT evaluate and treat -Pain control -Dispo: per primary  ___________________________________________________________________________  Subjective: No acute events overnight. Leg is still numb from the block. Pain is well controlled.   Reports that she was supposed to get an MRI of her thoracic spine on Sunday but then had this fall and was unable to get it. She is very concerned about this and wants to get it done while she is inpatient since she will not be able to get it on the outpatient setting now.   Physical Exam:  General: no acute distress, appears stated age Neurologic: alert, answering questions appropriately, following commands Respiratory: unlabored breathing on nasal canula, symmetric chest rise Psychiatric: appropriate affect, normal cadence to speech  MSK:   -Left lower extremity  Splint in place and c/d/i Does not plantarflexes and dorsiflexes toes Numb to light touch over dorsum and plantar aspect of toes Foot warm and well perfused   Patient name: Kathleen [name] Patient MRN: [id number] Date: 03/11/23

## 2023-03-11 NOTE — TOC CAGE-AID Note (Signed)
Transition of Care [facility]) - CAGE-AID Screening   Patient Details  Name: FRANKIE ZITO MRN: [id number] Date of Birth: 07-10-1961  Transition of Care [facility]) CM/SW Contact:    Katha Hamming, RN Phone Number: 03/11/2023, 10:00 PM    CAGE-AID Screening:    Have You Ever Felt You Ought to Cut Down on Your Drinking or Drug Use?: No Have People Annoyed You By [employer] Your Drinking Or Drug Use?: No Have You Felt Bad Or Guilty About Your Drinking Or Drug Use?: No Have You Ever Had a Drink or Used Drugs First Thing In The Morning to Steady Your Nerves or to Get Rid of a Hangover?: No CAGE-AID Score: 0  Substance Abuse Education Offered: No   Chart review with no hx alcohol/drug abuse, no resources indicated

## 2023-03-11 NOTE — Hospital Course (Signed)
62 year old female with a history of chronic respiratory failure on 4 L, fibromyalgia, anxiety/depression, opioid dependence, adrenal insufficiency, hypothyroidism, sarcoidosis, pulmonary fibrosis presenting with a mechanical fall when she was getting out of bed in the early a.m. 03/10/2023.  She had pain in the left leg and was unable to get up.  EMS was activated but the patient to the emergency department.  X-ray showed an acute closed bimalleolar fracture with lateral dislocation of the left ankle.   In addition, the patient states that she has been a little more short of breath than usual for the past 2 days.  She complains of a largely nonproductive cough.  She denies any fevers, chills, chest pain, nausea, vomiting or abdominal pain.  There is no hemoptysis, there is no hematochezia or melena.  At baseline, the patient is on 4 L nasal cannula.  She has never smoked.   In the ED, the patient was afebrile and hemodynamically stable with oxygen saturation 94-95% on 4 L.  WBC 14.9, hemoglobin 10.5, platelets 401,000.  Sodium 136, potassium 3.5, bicarbonate 30, serum creatinine 0.8.  Chest x-ray showed chronic interstitial changes and distorted architecture without any acute findings.

## 2023-03-11 NOTE — Plan of Care (Signed)

## 2023-03-11 NOTE — Evaluation (Addendum)
Physical Therapy Evaluation Patient Details Name: Kathleen [name] MRN: [id number] DOB: 11/11/1960 Today's Date: 03/11/2023  History of Present Illness  Pt is a 62 year old female admitted on 03/10/23 after a mechanical fall when she was getting out of bed in the early a.m. 03/10/2023.  She had pain in the left leg and was unable to get up.  EMS was activated but the patient to the emergency department.  X-ray showed an acute closed bimalleolar fracture with lateral dislocation of the left ankle. Past history of chronic respiratory failure on 4 L, fibromyalgia, anxiety/depression, opioid dependence, adrenal insufficiency, hypothyroidism, sarcoidosis, pulmonary fibrosis  Clinical Impression  Pt presents with admitting diagnosis above. Pt reports at baseline she mostly uses an electric scooter and will occasionally ambulate however does have a history of falling when not using the scooter. Pt reports husband helps with all ADLs however he is scheduled to have knee surgery at [facility] tomorrow morning and will not able to assist. Today pt was able to stand EOB with RW +2 Min A however was very self limiting and refused to try and take steps. Pt spent majority of session perseverating on when her MRI would be and asking this therapist for medications from her home regiment. Recommend SNF upon DC and pt would like to be at same facility as her husband should he need rehab following his knee surgery tomorrow. PT will continue to follow.      Assistance Recommended at Discharge Frequent or constant Supervision/Assistance  If plan is discharge home, recommend the following:  Can travel by private vehicle  Two people to help with walking and/or transfers;A lot of help with bathing/dressing/bathroom;Assistance with cooking/housework;Direct supervision/assist for medications management;Assist for transportation;Help with stairs or ramp for entrance   No    Equipment Recommendations Other (comment) (Per  accepting facility)  Recommendations for Other Services  OT consult    Functional Status Assessment Patient has had a recent decline in their functional status and demonstrates the ability to make significant improvements in function in a reasonable and predictable amount of time.     Precautions / Restrictions Precautions Precautions: Fall Restrictions Weight Bearing Restrictions: Yes LLE Weight Bearing: Non weight bearing      Mobility  Bed Mobility Overal bed mobility: Needs Assistance Bed Mobility: Supine to Sit, Sit to Supine     Supine to sit: Supervision Sit to supine: Supervision   General bed mobility comments: use of bedrails and increased time    Transfers Overall transfer level: Needs assistance Equipment used: Rolling walker (2 wheels) Transfers: Sit to/from Stand Sit to Stand: +2 safety/equipment, Min assist, From elevated surface           General transfer comment: Pt requested husband to hold her leg up to maintain NWB status. Pt refused to take steps. Pt also requested bed elevated. Very self limiting with mobility.    Ambulation/Gait               General Gait Details: Pt refused  Stairs            Wheelchair Mobility     Tilt Bed    Modified Rankin (Stroke Patients Only)       Balance Overall balance assessment: Needs assistance, History of Falls Sitting-balance support: Bilateral upper extremity supported, Feet unsupported Sitting balance-Leahy Scale: Fair Sitting balance - Comments: Able to EOB for ~5 minutes   Standing balance support: Bilateral upper extremity supported, During functional activity, Reliant on assistive device for balance  Standing balance-Leahy Scale: Poor Standing balance comment: Reliant on RW                             Pertinent Vitals/Pain Pain Assessment Pain Assessment: 0-10 Pain Score: 10-Worst pain ever Pain Location: L foot Pain Descriptors / Indicators: Discomfort,  Grimacing Pain Intervention(s): Monitored during session, Premedicated before session, Limited activity within patient's tolerance    Home Living Family/patient expects to be discharged to:: Private residence Living Arrangements: Spouse/significant other Available Help at Discharge: Family;Available PRN/intermittently (Husband reports that he is having knee surgery tomorrow and will not be able to assist) Type of Home: House Home Access: Stairs to enter Entrance Stairs-Rails: None Entrance Stairs-Number of Steps: 1   Home Layout: One level Home Equipment: Agricultural consultant (2 wheels);Electric scooter;Shower seat;Hand held shower head      Prior Function Prior Level of Function : History of Falls (last six months);Needs assist (2 falls in the past 6 months. Pt reports her foot going to sleep. Injuries from both falls. Pt reports she broke her back on the fall in april.)             Mobility Comments: Pt mostly uses electric scooter to get around house and in community ADLs Comments: Pt reports husband helps with all ADLs     Hand Dominance   Dominant Hand: Right    Extremity/Trunk Assessment   Upper Extremity Assessment Upper Extremity Assessment: Overall WFL for tasks assessed    Lower Extremity Assessment Lower Extremity Assessment: LLE deficits/detail LLE Deficits / Details: ankle fx    Cervical / Trunk Assessment Cervical / Trunk Assessment: Normal  Communication   Communication: No difficulties  Cognition Arousal/Alertness: Awake/alert Behavior During Therapy: WFL for tasks assessed/performed Overall Cognitive Status: Within Functional Limits for tasks assessed                                          General Comments General comments (skin integrity, edema, etc.): VSS on 4L    Exercises     Assessment/Plan    PT Assessment Patient needs continued PT services  PT Problem List Decreased strength;Decreased range of motion;Decreased activity  tolerance;Decreased balance;Decreased mobility;Decreased coordination;Decreased knowledge of use of DME;Decreased safety awareness;Decreased knowledge of precautions;Cardiopulmonary status limiting activity;Pain;Obesity       PT Treatment Interventions DME instruction;Gait training;Stair training;Functional mobility training;Therapeutic activities;Therapeutic exercise;Balance training;Neuromuscular re-education;Patient/family education    PT Goals (Current goals can be found in the Care Plan section)  Acute Rehab PT Goals Patient Stated Goal: To get better PT Goal Formulation: With patient Time For Goal Achievement: 03/25/23 Potential to Achieve Goals: Fair    Frequency Min 3X/week     Co-evaluation               AM-PAC PT "6 Clicks" Mobility  Outcome Measure Help needed turning from your back to your side while in a flat bed without using bedrails?: A Little Help needed moving from lying on your back to sitting on the side of a flat bed without using bedrails?: A Little Help needed moving to and from a bed to a chair (including a wheelchair)?: Total Help needed standing up from a chair using your arms (e.g., wheelchair or bedside chair)?: A Lot Help needed to walk in hospital room?: Total Help needed climbing 3-5 steps with a railing? : Total 6  Click Score: 11    End of Session Equipment Utilized During Treatment: Gait belt Activity Tolerance: Patient limited by pain;Other (comment) (Pt very self limiting. Perseverating on need for home medication and MRI.) Patient left: in bed;with call bell/phone within reach;with family/visitor present Nurse Communication: Mobility status PT Visit Diagnosis: Other abnormalities of gait and mobility (R26.89)    Time: 8315-1761 PT Time Calculation (min) (ACUTE ONLY): 32 min   Charges:   PT Evaluation $PT Eval Moderate Complexity: 1 Mod PT Treatments $Therapeutic Activity: 8-22 mins PT General Charges $$ ACUTE PT VISIT: 1  Visit         Shela Nevin, PT, DPT Acute Rehab Services [phone]   Gladys Damme 03/11/2023, 4:15 PM

## 2023-03-11 NOTE — Progress Notes (Signed)
   03/10/23 2246  Provider Notification  Provider Name/Title J. Garner Nash, MD  Date Provider Notified 03/10/23  Time Provider Notified 2246  Method of Notification Page (SECURE CHAT)  Notification Reason Other (Comment) (CHEST PAIN, no changes to EKG per telemetry. pt has hx of anxiety and has been notified this is her norm. given atarx prior. pt received oxycodone for relief.)  Provider response No new orders  Date of Provider Response 03/10/23  Time of Provider Response [phone]

## 2023-03-11 NOTE — Progress Notes (Signed)
   03/11/23 0329  Provider Notification  Provider Name/Title Joyice Faster, RRT  Date Provider Notified 03/11/23  Time Provider Notified 0330  Method of Notification Call  Notification Reason Other (Comment) (respiratory treatments not completed. pt has no complaints at this time. just follow-up to ensure pt receives ordered treatment)  Provider response En route  Date of Provider Response 03/11/23  Time of Provider Response 0331   Treatments missed overnight. Pt denies complaints at this time and did not request any treatment.

## 2023-03-11 NOTE — Progress Notes (Signed)
Pt states she wants to have her MRI as soon as possible. Informed her I will let dayshift know to get this arranged.

## 2023-03-12 DIAGNOSIS — S82842S Displaced bimalleolar fracture of left lower leg, sequela: Secondary | ICD-10-CM | POA: Diagnosis not present

## 2023-03-12 DIAGNOSIS — S82892A Other fracture of left lower leg, initial encounter for closed fracture: Secondary | ICD-10-CM | POA: Diagnosis not present

## 2023-03-12 LAB — COMPREHENSIVE METABOLIC PANEL
ALT: 15 U/L (ref 0–44)
AST: 13 U/L — ABNORMAL LOW (ref 15–41)
Albumin: 3 g/dL — ABNORMAL LOW (ref 3.5–5.0)
Alkaline Phosphatase: 88 U/L (ref 38–126)
Anion gap: 7 (ref 5–15)
BUN: 11 mg/dL (ref 6–20)
CO2: 34 mmol/L — ABNORMAL HIGH (ref 22–32)
Calcium: 8.9 mg/dL (ref 8.9–10.3)
Chloride: 97 mmol/L — ABNORMAL LOW (ref 98–111)
Creatinine, Ser: 0.9 mg/dL (ref 0.44–1.00)
GFR, Estimated: >60 mL/min (ref >60)
Glucose, Bld: 146 mg/dL — ABNORMAL HIGH (ref 70–99)
Potassium: 5 mmol/L (ref 3.5–5.1)
Sodium: 138 mmol/L (ref 135–145)
Total Bilirubin: 0.2 mg/dL — ABNORMAL LOW (ref 0.3–1.2)
Total Protein: 5.6 g/dL — ABNORMAL LOW (ref 6.5–8.1)

## 2023-03-12 LAB — CBC
HCT: 29.1 % — ABNORMAL LOW (ref 36.0–46.0)
Hemoglobin: 8.7 g/dL — ABNORMAL LOW (ref 12.0–15.0)
MCH: 28.6 pg (ref 26.0–34.0)
MCHC: 29.9 g/dL — ABNORMAL LOW (ref 30.0–36.0)
MCV: 95.7 fL (ref 80.0–100.0)
Platelets: 367 10*3/uL (ref 150–400)
RBC: 3.04 MIL/uL — ABNORMAL LOW (ref 3.87–5.11)
RDW: 14.3 % (ref 11.5–15.5)
WBC: 12.1 10*3/uL — ABNORMAL HIGH (ref 4.0–10.5)
nRBC: 0.2 % (ref 0.0–0.2)

## 2023-03-12 MED ORDER — TRAZODONE HCL 50 MG PO TABS
100.0000 mg | ORAL_TABLET | Freq: Every evening | ORAL | Status: DC | PRN
  Administered 2023-03-12 – 2023-03-13 (×2): 100 mg via ORAL
  Filled 2023-03-12 (×2): qty 2

## 2023-03-12 NOTE — Plan of Care (Signed)
  Problem: Clinical Measurements: Goal: Respiratory complications will improve Outcome: Progressing   Problem: Activity: Goal: Risk for activity intolerance will decrease Outcome: Progressing   Problem: Health Behavior/Discharge Planning: Goal: Ability to manage health-related needs will improve Outcome: Progressing   Problem: Coping: Goal: Level of anxiety will decrease Outcome: Progressing

## 2023-03-12 NOTE — Progress Notes (Signed)
  Progress Note   Patient: Kathleen [name] NWG:[id number] DOB: Jul 29, 1961 DOA: 03/10/2023     2 DOS: the patient was seen and examined on 03/12/2023   Brief hospital course: 62 year old female with a history of chronic respiratory failure on 4 L, fibromyalgia, anxiety/depression, opioid dependence, adrenal insufficiency, hypothyroidism, sarcoidosis, pulmonary fibrosis presenting with a mechanical fall when she was getting out of bed in the early a.m. 03/10/2023.  She had pain in the left leg and was unable to get up.  EMS was activated but the patient to the emergency department.  X-ray showed an acute closed bimalleolar fracture with lateral dislocation of the left ankle.   In addition, the patient states that she has been a little more short of breath than usual for the past 2 days.  She complains of a largely nonproductive cough.  She denies any fevers, chills, chest pain, nausea, vomiting or abdominal pain.  There is no hemoptysis, there is no hematochezia or melena.  At baseline, the patient is on 4 L nasal cannula.  She has never smoked.   In the ED, the patient was afebrile and hemodynamically stable with oxygen saturation 94-95% on 4 L.  WBC 14.9, hemoglobin 10.5, platelets 401,000.  Sodium 136, potassium 3.5, bicarbonate 30, serum creatinine 0.8.  Chest x-ray showed chronic interstitial changes and distorted architecture without any acute findings.  Assessment and Plan: Closed left bimalleolar ankle fracture -Orthopedic Surgery consulted, now s/p surgery 7/14 -cont analgesia as needed -Therapy recs for SNF noted    Interstitial lung disease/sarcoidosis exacerbation -covid neg -cont Brovana and Pulmicort -Continue DuoNebs -02/26/2023 CTA chest negative for PE; scattered scarring and bronchiectasis -improved with solumedrol. Have weaned steroids down to baseline 5mg  prednisone in AM   Chronic respiratory failure with hypoxia -Chronically on 4 L nasal cannula -Remains stable this AM    Adrenal insufficiency -Patient chronically on hydrocortisone--20 mg a.m., 10 mg at lunch -Now off solumedrol, continued home hydrocortisone   Opioid dependence/chronic pain -PDMP reviewed -oxycontin ER 40 mg, #28, last refill 03/01/2023 -Oxy IR 5 mg, #56, last refill 03/01/2023   Anxiety/depression/fibromyalgia -Resume escitalopram and hydroxyzine   Hyperlipidemia -Continue statin   Class II obesity -BMI 36.54 -Lifestyle modification   Hypothyroidism -Continue levothyroxine   Subjective: Reports trouble sleeping overnight secondary to pain. Would like something to help her sleep at night  Physical Exam: Vitals:   03/12/23 0856 03/12/23 0859 03/12/23 1348 03/12/23 1458  BP:   116/76   Pulse:   78 78  Resp:    20  Temp:   98.4 F (36.9 C)   TempSrc:   Oral   SpO2: 97% 97% 96% 96%  Weight:      Height:       General exam: Conversant, in no acute distress Respiratory system: normal chest rise, clear, no audible wheezing Cardiovascular system: regular rhythm, s1-s2 Gastrointestinal system: Nondistended, nontender, pos BS Central nervous system: No seizures, no tremors Extremities: No cyanosis, no joint deformities Skin: No rashes, no pallor Psychiatry: Affect normal // no auditory hallucinations   Data Reviewed:  Labs reviewed: Na 138, K 5.0, Cr 0.90, WBC 12.1, Hgb 8.7, Plts 367  Family Communication: Pt in room, family not at bedside  Disposition: Status is: Inpatient Remains inpatient appropriate because: Severity of illness  Planned Discharge Destination: Skilled nursing facility     Author: Rickey Barbara, MD 03/12/2023 4:28 PM  For on call review [email or web address].

## 2023-03-12 NOTE — Plan of Care (Signed)
Patient alert/oriented X4. Patient compliant with medication administration. Patient administered dilaudid as needed for pain. Patient refused repositioning this shift and remains on 4L nasal cannula. VSS. Will continue to monitor.  Problem: Education: Goal: Knowledge of General Education information will improve Description: Including pain rating scale, medication(s)/side effects and non-pharmacologic comfort measures Outcome: Progressing   Problem: Health Behavior/Discharge Planning: Goal: Ability to manage health-related needs will improve Outcome: Progressing   Problem: Clinical Measurements: Goal: Ability to maintain clinical measurements within normal limits will improve Outcome: Progressing   Problem: Clinical Measurements: Goal: Will remain free from infection Outcome: Progressing   Problem: Clinical Measurements: Goal: Diagnostic test results will improve Outcome: Progressing   Problem: Clinical Measurements: Goal: Respiratory complications will improve Outcome: Progressing   Problem: Clinical Measurements: Goal: Cardiovascular complication will be avoided Outcome: Progressing   Problem: Activity: Goal: Risk for activity intolerance will decrease Outcome: Progressing   Problem: Activity: Goal: Risk for activity intolerance will decrease Outcome: Progressing   Problem: Nutrition: Goal: Adequate nutrition will be maintained Outcome: Progressing   Problem: Elimination: Goal: Will not experience complications related to bowel motility Outcome: Progressing   Problem: Elimination: Goal: Will not experience complications related to urinary retention Outcome: Progressing   Problem: Pain Managment: Goal: General experience of comfort will improve Outcome: Progressing   Problem: Safety: Goal: Ability to remain free from injury will improve Outcome: Progressing   Problem: Skin Integrity: Goal: Risk for impaired skin integrity will decrease Outcome:  Progressing

## 2023-03-12 NOTE — Progress Notes (Signed)
Orthopedic Surgery Progress Note   Assessment: Patient is a 62 y.o. female with left trimalleolar ankle fracture and syndesmotic disruption status post ORIF   Plan: -Operative plans: complete -Diet: regular -DVT ppx: heparin, will switch to aspirin 81mg  BID at discharge -Antibiotics: ancef x2 post-op doses -Weight bearing status: non-weight bearing left lower extremity -MRI T spine shows a subacute compression fracture. No operative management recommended. Out of bed as tolerated, no brace needed -PT evaluate and treat -Pain control -Dispo: SNF  ___________________________________________________________________________  Subjective: No acute events overnight. Block has warn off. Having some pain in her left ankle and her back. No pain elsewhere.    Physical Exam:  General: no acute distress, appears stated age Neurologic: alert, answering questions appropriately, following commands Respiratory: unlabored breathing on nasal canula, symmetric chest rise Psychiatric: appropriate affect, normal cadence to speech  MSK:   -Left lower extremity  Splint in place and c/d/i Plantarflexes and dorsiflexes toes Sensation intact to light touch over dorsum and plantar aspect of toes Foot warm and well perfused   Patient name: Kathleen [name] Patient MRN: [id number] Date: 03/12/23

## 2023-03-12 NOTE — [place] FL2 (Signed)
[place] MEDICAID FL2 LEVEL OF CARE FORM     IDENTIFICATION  Patient Name: Kathleen [name] Birthdate: 10/11/1960 Sex: female Admission Date (Current Location): 03/10/2023  [facility] and [place] Number:  Producer, television/film/video and Address:  The . [facility], [address] [place], [place] [address]      Provider Number: [id number]  Attending Physician Name and Address:  Jerald Kief, MD  Relative Name and Phone Number:  Kathleen, [name] [phone]    Current Level of Care: Hospital Recommended Level of Care: Skilled Nursing Facility Prior Approval Number:    Date Approved/Denied:   PASRR Number:    Discharge Plan: SNF    Current Diagnoses: Patient Active Problem List   Diagnosis Date Noted   Ankle fracture, bimalleolar, closed, left, sequela 03/10/2023   Opioid dependence (HCC) 03/10/2023   Closed fracture of left ankle 03/10/2023   Syndesmotic disruption of ankle, left, initial encounter 03/10/2023   Lumbar transverse process fracture (HCC) 01/02/2023   Acute on chronic hypoxic respiratory failure (HCC) 12/13/2022   Depression, major, single episode, moderate (HCC) 09/10/2022   Aortic atherosclerosis (HCC) 09/10/2022   Chronic respiratory failure with hypoxia (HCC) 05/22/2021   Anxiety and depression 05/22/2021   Vitamin D deficiency 05/09/2021   Left foot drop 01/09/2021   Pulmonary HTN (HCC) 10/12/2020   Encounter for long-term opiate analgesic use 02/02/2020   Interstitial lung disease (HCC) 04/14/2019   GERD (gastroesophageal reflux disease) 10/15/2017   Personal history of noncompliance with medical treatment, presenting hazards to health 04/26/2017   Hypothyroidism 01/15/2017   Adrenal insufficiency (Addison's disease) (HCC) 08/22/2016   Symptomatic bradycardia    Hypokalemia 08/17/2016   Chronic pain syndrome 04/20/2016   Irritable bowel syndrome with constipation 11/14/2015   Avascular necrosis of bone of hip, left (HCC)  04/05/2015   Post herpetic neuralgia 10/22/2014   Mixed hyperlipidemia 08/25/2014   Obstructive sleep apnea 02/18/2014   Class 2 severe obesity due to excess calories with serious comorbidity and body mass index (BMI) of 38.0 to 38.9 in adult [facility]) 05/06/2013   Fibromyalgia 02/11/2013   Hypotension 01/16/2013   Fibrocystic breast disease in female 04/09/2012   Sarcoidosis 06/21/2008    Orientation RESPIRATION BLADDER Height & Weight     Self, Time, Situation, Place  O2 Continent, Indwelling catheter Weight: 240 lb 4.8 oz (109 kg) Height:  5' 7.99" (172.7 cm)  BEHAVIORAL SYMPTOMS/MOOD NEUROLOGICAL BOWEL NUTRITION STATUS      Continent Diet (see discharge summary)  AMBULATORY STATUS COMMUNICATION OF NEEDS Skin   Limited Assist Verbally Surgical wounds                       Personal Care Assistance Level of Assistance  Bathing, Feeding, Dressing Bathing Assistance: Limited assistance Feeding assistance: Independent Dressing Assistance: Limited assistance     Functional Limitations Info  Sight, Hearing, Speech Sight Info: Adequate Hearing Info: Adequate Speech Info: Adequate    SPECIAL CARE FACTORS FREQUENCY  PT (By licensed PT), OT (By licensed OT)     PT Frequency: 5x week OT Frequency: 5x week            Contractures Contractures Info: Not present    Additional Factors Info  Code Status, Allergies Code Status Info: full Allergies Info: Bee Venom, Contrast Media (Iodinated Contrast Media), Lactose Intolerance (Gi), Bactrim (Sulfamethoxazole-trimethoprim), Codeine, Morphine And Codeine           Current Medications (03/12/2023):  This is the current hospital  active medication list Current Facility-Administered Medications  Medication Dose Route Frequency Provider Last Rate Last Admin   acetaminophen (TYLENOL) tablet 650 mg  650 mg Oral Q6H PRN Tat, Onalee Hua, MD   650 mg at 03/10/23 2105   Or   acetaminophen (TYLENOL) suppository 650 mg  650 mg Rectal Q6H  PRN Tat, Onalee Hua, MD       arformoterol (BROVANA) nebulizer solution 15 mcg  15 mcg Nebulization BID Catarina Hartshorn, MD   15 mcg at 03/12/23 0853   budesonide (PULMICORT) nebulizer solution 0.5 mg  0.5 mg Nebulization BID Tat, Onalee Hua, MD   0.5 mg at 03/12/23 0854   busPIRone (BUSPAR) tablet 15 mg  15 mg Oral TID Catarina Hartshorn, MD   15 mg at 03/12/23 0911   Chlorhexidine Gluconate Cloth 2 % PADS 6 each  6 each Topical Daily Jerald Kief, MD   6 each at 03/12/23 0913   DULoxetine (CYMBALTA) DR capsule 60 mg  60 mg Oral Daily Tat, Onalee Hua, MD   60 mg at 03/12/23 0913   heparin injection 5,000 Units  5,000 Units Subcutaneous Andres Labrum, MD   5,000 Units at 03/12/23 1328   hydrocortisone (CORTEF) tablet 10 mg  10 mg Oral Daily Jerald Kief, MD   10 mg at 03/12/23 1327   hydrocortisone (CORTEF) tablet 20 mg  20 mg Oral Daily Jerald Kief, MD   20 mg at 03/12/23 2130   HYDROmorphone (DILAUDID) injection 1 mg  1 mg Intravenous Q2H PRN Tat, Onalee Hua, MD   1 mg at 03/12/23 1327   hydrOXYzine (ATARAX) tablet 25 mg  25 mg Oral TID PRN Catarina Hartshorn, MD   25 mg at 03/12/23 0912   ipratropium-albuterol (DUONEB) 0.5-2.5 (3) MG/3ML nebulizer solution 3 mL  3 mL Nebulization Q6H Catarina Hartshorn, MD   3 mL at 03/12/23 0859   levothyroxine (SYNTHROID) tablet 50 mcg  50 mcg Oral QAC breakfast Tat, Onalee Hua, MD   50 mcg at 03/12/23 0536   linaclotide (LINZESS) capsule 290 mcg  290 mcg Oral QAC breakfast Catarina Hartshorn, MD   290 mcg at 03/12/23 0911   nystatin (MYCOSTATIN/NYSTOP) topical powder   Topical BID Jerald Kief, MD   Given at 03/12/23 1014   ondansetron (ZOFRAN) tablet 4 mg  4 mg Oral Q6H PRN Tat, Onalee Hua, MD       Or   ondansetron (ZOFRAN) injection 4 mg  4 mg Intravenous Q6H PRN Tat, Onalee Hua, MD       oxyCODONE (OXYCONTIN) 12 hr tablet 40 mg  40 mg Oral Pablo Ledger, MD   40 mg at 03/12/23 0911   pantoprazole (PROTONIX) EC tablet 40 mg  40 mg Oral BID Denese Killings, MD   40 mg at 03/12/23 0911   polyethylene glycol  (MIRALAX / GLYCOLAX) packet 17 g  17 g Oral Daily Tat, Onalee Hua, MD   17 g at 03/12/23 0913   predniSONE (DELTASONE) tablet 10 mg  10 mg Oral q AM Jerald Kief, MD   10 mg at 03/12/23 0912   rosuvastatin (CRESTOR) tablet 10 mg  10 mg Oral Daily Tat, Onalee Hua, MD   10 mg at 03/12/23 0912   tranexamic acid (CYKLOKAPRON) IVPB 1,000 mg  1,000 mg Intravenous Once [name], MD       traZODone (DESYREL) tablet 100 mg  100 mg Oral QHS PRN Jerald Kief, MD         Discharge Medications: Please see discharge summary  for a list of discharge medications.  Relevant Imaging Results:  Relevant Lab Results:   Additional Information SSN: [id number]  Lorri Frederick, LCSW

## 2023-03-12 NOTE — TOC Progression Note (Addendum)
Transition of Care [facility]) - Progression Note    Patient Details  Name: Kathleen [name] MRN: [id number] Date of Birth: [birth year]  [facility] of [facility]) CM/SW Contact  Lorri Frederick, LCSW Phone Number: 03/12/2023, 2:27 PM  Clinical Narrative:   PT has recommended SNF, CSW discussed with pt, who is in agreement but will only go to [facility] or [facility].  Pt also asking to be admitted to SNF with her husband after his knee surgery today at [facility].  Permission given to speak with husband Amada Jupiter, daughter Ashely. Pt from home with husband, no current services.   Referral sent out in hub for SNF to those facilities.      Expected Discharge Plan: Skilled Nursing Facility Barriers to Discharge: Continued Medical Work up, SNF Pending bed offer  Expected Discharge Plan and Services   Discharge Planning Services: CM Consult Post Acute Care Choice: Skilled Nursing Facility Living arrangements for the past 2 months: Single Family Home                                       Social Determinants of Health (SDOH) Interventions SDOH Screenings   Food Insecurity: No Food Insecurity (03/10/2023)  Housing: Low Risk  (03/10/2023)  Transportation Needs: No Transportation Needs (03/10/2023)  Utilities: Not At Risk (03/10/2023)  Alcohol Screen: Low Risk  (09/21/2022)  Depression (PHQ2-9): Low Risk  (09/21/2022)  Recent Concern: Depression (PHQ2-9) - Medium Risk (08/22/2022)  Financial Resource Strain: Low Risk  (09/21/2022)  Physical Activity: Inactive (09/21/2022)  Social Connections: Socially Integrated (09/21/2022)  Stress: No Stress Concern Present (01/14/2023)  Tobacco Use: Low Risk  (03/10/2023)    Readmission Risk Interventions     No data to display

## 2023-03-12 NOTE — Progress Notes (Signed)
RE:  Kathleen [name]      Date of Birth:  02-16-2061     Date:   03/12/23       To Whom It May Concern:  Please be advised that the above-named patient will require a short-term nursing home stay - anticipated 30 days or less for rehabilitation and strengthening.  The plan is for return home.                 MD signature                Date

## 2023-03-13 ENCOUNTER — Ambulatory Visit: Payer: HMO | Admitting: Orthopaedic Surgery

## 2023-03-13 DIAGNOSIS — F1129 Opioid dependence with unspecified opioid-induced disorder: Secondary | ICD-10-CM

## 2023-03-13 DIAGNOSIS — J849 Interstitial pulmonary disease, unspecified: Secondary | ICD-10-CM

## 2023-03-13 DIAGNOSIS — J9611 Chronic respiratory failure with hypoxia: Secondary | ICD-10-CM | POA: Diagnosis not present

## 2023-03-13 DIAGNOSIS — S82842S Displaced bimalleolar fracture of left lower leg, sequela: Secondary | ICD-10-CM | POA: Diagnosis not present

## 2023-03-13 DIAGNOSIS — S82892A Other fracture of left lower leg, initial encounter for closed fracture: Secondary | ICD-10-CM | POA: Diagnosis not present

## 2023-03-13 LAB — COMPREHENSIVE METABOLIC PANEL
ALT: 13 U/L (ref 0–44)
AST: 12 U/L — ABNORMAL LOW (ref 15–41)
Albumin: 3 g/dL — ABNORMAL LOW (ref 3.5–5.0)
Alkaline Phosphatase: 85 U/L (ref 38–126)
Anion gap: 8 (ref 5–15)
BUN: 14 mg/dL (ref 6–20)
CO2: 33 mmol/L — ABNORMAL HIGH (ref 22–32)
Calcium: 8.6 mg/dL — ABNORMAL LOW (ref 8.9–10.3)
Chloride: 98 mmol/L (ref 98–111)
Creatinine, Ser: 0.9 mg/dL (ref 0.44–1.00)
GFR, Estimated: >60 mL/min (ref >60)
Glucose, Bld: 120 mg/dL — ABNORMAL HIGH (ref 70–99)
Potassium: 4.8 mmol/L (ref 3.5–5.1)
Sodium: 139 mmol/L (ref 135–145)
Total Bilirubin: 0.2 mg/dL — ABNORMAL LOW (ref 0.3–1.2)
Total Protein: 5.7 g/dL — ABNORMAL LOW (ref 6.5–8.1)

## 2023-03-13 LAB — CBC
HCT: 30.7 % — ABNORMAL LOW (ref 36.0–46.0)
Hemoglobin: 9.1 g/dL — ABNORMAL LOW (ref 12.0–15.0)
MCH: 28.5 pg (ref 26.0–34.0)
MCHC: 29.6 g/dL — ABNORMAL LOW (ref 30.0–36.0)
MCV: 96.2 fL (ref 80.0–100.0)
Platelets: 380 10*3/uL (ref 150–400)
RBC: 3.19 MIL/uL — ABNORMAL LOW (ref 3.87–5.11)
RDW: 14.4 % (ref 11.5–15.5)
WBC: 11.8 10*3/uL — ABNORMAL HIGH (ref 4.0–10.5)
nRBC: 0.4 % — ABNORMAL HIGH (ref 0.0–0.2)

## 2023-03-13 MED ORDER — ASPIRIN 81 MG PO TBEC: 81.0000 mg | DELAYED_RELEASE_TABLET | Freq: Two times a day (BID) | ORAL | 0 refills | Status: AC

## 2023-03-13 MED ORDER — OXYCODONE HCL ER 40 MG PO T12A: 40.0000 mg | EXTENDED_RELEASE_TABLET | Freq: Two times a day (BID) | ORAL | 0 refills | Status: DC

## 2023-03-13 MED ORDER — POLYETHYLENE GLYCOL 3350 17 G PO PACK: 17.0000 g | PACK | Freq: Every day | ORAL | 0 refills | Status: AC

## 2023-03-13 MED ORDER — SENNOSIDES-DOCUSATE SODIUM 8.6-50 MG PO TABS: 1.0000 | ORAL_TABLET | Freq: Two times a day (BID) | ORAL | Status: DC | PRN

## 2023-03-13 MED ORDER — OXYCODONE HCL 5 MG PO TABS: 5.0000 mg | ORAL_TABLET | Freq: Four times a day (QID) | ORAL | 0 refills | Status: DC | PRN

## 2023-03-13 MED ORDER — ACETAMINOPHEN 500 MG PO TABS: 1000.0000 mg | ORAL_TABLET | Freq: Three times a day (TID) | ORAL | 0 refills | Status: AC

## 2023-03-13 MED ORDER — SENNOSIDES-DOCUSATE SODIUM 8.6-50 MG PO TABS
2.0000 | ORAL_TABLET | Freq: Two times a day (BID) | ORAL | Status: DC
  Administered 2023-03-13 – 2023-03-14 (×3): 2 via ORAL
  Filled 2023-03-13 (×3): qty 2

## 2023-03-13 MED ORDER — OXYCODONE HCL 5 MG PO TABS: 5.0000 mg | ORAL_TABLET | ORAL | 0 refills | Status: AC | PRN

## 2023-03-13 NOTE — Progress Notes (Signed)
PT Cancellation Note  Patient Details Name: Kathleen [name] MRN: [id number] DOB: [birth year]   Cancelled Treatment:    Reason Eval/Treat Not Completed: Fatigue/lethargy limiting ability to participate (Pt sound asleep upon entering room. Pt not responding to therapist shouting name x3. Will try again later if time allows.)   Gladys Damme 03/13/2023, 10:12 AM

## 2023-03-13 NOTE — Plan of Care (Signed)
Patient's prescriptions for pain, laxatives, and dvt prophylaxis were printed, signed, and placed into her physical chart this evening. She was prescribed her chronic oxycontin twice daily. This medication will need to be re-prescribed either by her facility or the provider that had been managing her chronic pain previously. A prescription for oxycodone 5-10mg  every 4 hours was written for her acute pain from surgery. I will manage her acute, post-surgical pain for six weeks. Aspirin 81mg  BID was prescribed for dvt prophylaxis. Senna and miralax were written to prevent constipation with her increased opioid use after surgery.   [name], MD Orthopedic Surgeon

## 2023-03-13 NOTE — Telephone Encounter (Signed)
I think that is a good choice for her

## 2023-03-13 NOTE — [place] FL2 (Signed)
[place] MEDICAID FL2 LEVEL OF CARE FORM     IDENTIFICATION  Patient Name: Kathleen [name] Birthdate: May 19, 1961 Sex: female Admission Date (Current Location): 03/10/2023  [facility] and [place] Number:  Producer, television/film/video and Address:  [facility] [place], [address] [place], [place] [address]      Provider Number: [id number]  Attending Physician Name and Address:  Almon Hercules, MD  Relative Name and Phone Number:  Jalaya, Sarver [phone]    Current Level of Care: Hospital Recommended Level of Care: Skilled Nursing Facility Prior Approval Number:    Date Approved/Denied:   PASRR Number:    Discharge Plan: SNF    Current Diagnoses: Patient Active Problem List   Diagnosis Date Noted   Ankle fracture, bimalleolar, closed, left, sequela 03/10/2023   Opioid dependence (HCC) 03/10/2023   Closed fracture of left ankle 03/10/2023   Syndesmotic disruption of ankle, left, initial encounter 03/10/2023   Lumbar transverse process fracture (HCC) 01/02/2023   Acute on chronic hypoxic respiratory failure (HCC) 12/13/2022   Depression, major, single episode, moderate (HCC) 09/10/2022   Aortic atherosclerosis (HCC) 09/10/2022   Chronic respiratory failure with hypoxia (HCC) 05/22/2021   Anxiety and depression 05/22/2021   Vitamin D deficiency 05/09/2021   Left foot drop 01/09/2021   Pulmonary HTN (HCC) 10/12/2020   Encounter for long-term opiate analgesic use 02/02/2020   Interstitial lung disease (HCC) 04/14/2019   GERD (gastroesophageal reflux disease) 10/15/2017   Personal history of noncompliance with medical treatment, presenting hazards to health 04/26/2017   Hypothyroidism 01/15/2017   Adrenal insufficiency (Addison's disease) (HCC) 08/22/2016   Symptomatic bradycardia    Hypokalemia 08/17/2016   Chronic pain syndrome 04/20/2016   Irritable bowel syndrome with constipation 11/14/2015   Avascular necrosis of bone of hip, left (HCC) 04/05/2015    Post herpetic neuralgia 10/22/2014   Mixed hyperlipidemia 08/25/2014   Obstructive sleep apnea 02/18/2014   Class 2 severe obesity due to excess calories with serious comorbidity and body mass index (BMI) of 38.0 to 38.9 in adult [facility]) 05/06/2013   Fibromyalgia 02/11/2013   Hypotension 01/16/2013   Fibrocystic breast disease in female 04/09/2012   Sarcoidosis 06/21/2008    Orientation RESPIRATION BLADDER Height & Weight     Self, Time, Situation, Place  O2 Continent, Indwelling catheter Weight: 240 lb 4.8 oz (109 kg) Height:  5' 7.99" (172.7 cm)  BEHAVIORAL SYMPTOMS/MOOD NEUROLOGICAL BOWEL NUTRITION STATUS      Continent Diet (see discharge summary)  AMBULATORY STATUS COMMUNICATION OF NEEDS Skin   Limited Assist Verbally Surgical wounds                       Personal Care Assistance Level of Assistance  Bathing, Feeding, Dressing Bathing Assistance: Limited assistance Feeding assistance: Independent Dressing Assistance: Limited assistance     Functional Limitations Info  Sight, Hearing, Speech Sight Info: Adequate Hearing Info: Adequate Speech Info: Adequate    SPECIAL CARE FACTORS FREQUENCY  PT (By licensed PT), OT (By licensed OT)     PT Frequency: 5x week OT Frequency: 5x week            Contractures Contractures Info: Not present    Additional Factors Info  Code Status, Allergies Code Status Info: full Allergies Info: Bee Venom, Contrast Media (Iodinated Contrast Media), Lactose Intolerance (Gi), Bactrim (Sulfamethoxazole-trimethoprim), Codeine, Morphine And Codeine           Current Medications (03/13/2023):  This is the current hospital  active medication list Current Facility-Administered Medications  Medication Dose Route Frequency Provider Last Rate Last Admin   acetaminophen (TYLENOL) tablet 650 mg  650 mg Oral Q6H PRN Tat, Onalee Hua, MD   650 mg at 03/10/23 2105   Or   acetaminophen (TYLENOL) suppository 650 mg  650 mg Rectal Q6H PRN Tat,  Onalee Hua, MD       arformoterol (BROVANA) nebulizer solution 15 mcg  15 mcg Nebulization BID Tat, David, MD   15 mcg at 03/13/23 0830   budesonide (PULMICORT) nebulizer solution 0.5 mg  0.5 mg Nebulization BID Tat, David, MD   0.5 mg at 03/13/23 0830   busPIRone (BUSPAR) tablet 15 mg  15 mg Oral TID Catarina Hartshorn, MD   15 mg at 03/13/23 1011   Chlorhexidine Gluconate Cloth 2 % PADS 6 each  6 each Topical Daily Jerald Kief, MD   6 each at 03/13/23 1012   DULoxetine (CYMBALTA) DR capsule 60 mg  60 mg Oral Daily Tat, Onalee Hua, MD   60 mg at 03/13/23 1011   heparin injection 5,000 Units  5,000 Units Subcutaneous Andres Labrum, MD   5,000 Units at 03/13/23 3244   hydrocortisone (CORTEF) tablet 10 mg  10 mg Oral Daily Jerald Kief, MD   10 mg at 03/13/23 1237   hydrocortisone (CORTEF) tablet 20 mg  20 mg Oral Daily Jerald Kief, MD   20 mg at 03/13/23 0753   HYDROmorphone (DILAUDID) injection 1 mg  1 mg Intravenous Q2H PRN Catarina Hartshorn, MD   1 mg at 03/13/23 1237   hydrOXYzine (ATARAX) tablet 25 mg  25 mg Oral TID PRN Catarina Hartshorn, MD   25 mg at 03/12/23 0912   ipratropium-albuterol (DUONEB) 0.5-2.5 (3) MG/3ML nebulizer solution 3 mL  3 mL Nebulization Q6H Catarina Hartshorn, MD   3 mL at 03/13/23 0831   levothyroxine (SYNTHROID) tablet 50 mcg  50 mcg Oral QAC breakfast Tat, Onalee Hua, MD   50 mcg at 03/13/23 0102   linaclotide (LINZESS) capsule 290 mcg  290 mcg Oral QAC breakfast Catarina Hartshorn, MD   290 mcg at 03/13/23 0753   nystatin (MYCOSTATIN/NYSTOP) topical powder   Topical BID Jerald Kief, MD   Given at 03/13/23 1320   ondansetron (ZOFRAN) tablet 4 mg  4 mg Oral Q6H PRN Tat, Onalee Hua, MD       Or   ondansetron (ZOFRAN) injection 4 mg  4 mg Intravenous Q6H PRN Tat, Onalee Hua, MD       oxyCODONE (OXYCONTIN) 12 hr tablet 40 mg  40 mg Oral Q12H Catarina Hartshorn, MD   40 mg at 03/13/23 1011   pantoprazole (PROTONIX) EC tablet 40 mg  40 mg Oral BID Denese Killings, MD   40 mg at 03/13/23 0753   polyethylene glycol (MIRALAX /  GLYCOLAX) packet 17 g  17 g Oral Daily Tat, Onalee Hua, MD   17 g at 03/13/23 1011   predniSONE (DELTASONE) tablet 10 mg  10 mg Oral q AM Jerald Kief, MD   10 mg at 03/13/23 7253   rosuvastatin (CRESTOR) tablet 10 mg  10 mg Oral Daily Tat, Onalee Hua, MD   10 mg at 03/13/23 1011   senna-docusate (Senokot-S) tablet 2 tablet  2 tablet Oral BID Candelaria Stagers T, MD       tranexamic acid (CYKLOKAPRON) IVPB 1,000 mg  1,000 mg Intravenous Once [name], MD       traZODone (DESYREL) tablet 100 mg  100 mg Oral  QHS PRN Jerald Kief, MD   100 mg at 03/12/23 2139     Discharge Medications: Please see discharge summary for a list of discharge medications.  Relevant Imaging Results:  Relevant Lab Results:   Additional Information SSN: [id number]  Lorri Frederick, LCSW

## 2023-03-13 NOTE — Plan of Care (Signed)
  Problem: Education: Goal: Knowledge of General Education information will improve Description: Including pain rating scale, medication(s)/side effects and non-pharmacologic comfort measures Outcome: Progressing   Problem: Nutrition: Goal: Adequate nutrition will be maintained Outcome: Progressing   Problem: Elimination: Goal: Will not experience complications related to bowel motility Outcome: Progressing   Problem: Safety: Goal: Ability to remain free from injury will improve Outcome: Progressing   

## 2023-03-13 NOTE — TOC Progression Note (Addendum)
Transition of Care [facility]) - Progression Note    Patient Details  Name: Kathleen [name] MRN: [id number] Date of Birth: 01-Jan-1961  Transition of Care [facility]) CM/SW Contact  Lorri Frederick, LCSW Phone Number: 03/13/2023, 2:12 PM  Clinical Narrative:    [facility] does offer bed.  [facility] does not.  [facility] does confirm that she can take pt husband as well but he will need separate referral.  CSW spoke with pt, who does want to accept offer at [facility].  CSW was able to get her husband on the phone: Marylu Lund.  He confirms he is a Financial risk analyst. Husband not sure what DC plan is currently.  CSW able to identify that Memory Argue is CSW for husband, spoke with her.  DC plan not confirmed yet but he had knee surgery and may DC home.  She will update when she has more info.  If SNF, she will contact [facility].  PASSR requires correct FL2.  Corrected copy uploaded.  Auth request: CSW LM with [employer].     Expected Discharge Plan: Skilled Nursing Facility Barriers to Discharge: Continued Medical Work up, SNF Pending bed offer  Expected Discharge Plan and Services   Discharge Planning Services: CM Consult Post Acute Care Choice: Skilled Nursing Facility Living arrangements for the past 2 months: Single Family Home                                       Social Determinants of Health (SDOH) Interventions SDOH Screenings   Food Insecurity: No Food Insecurity (03/10/2023)  Housing: Low Risk  (03/10/2023)  Transportation Needs: No Transportation Needs (03/10/2023)  Utilities: Not At Risk (03/10/2023)  Alcohol Screen: Low Risk  (09/21/2022)  Depression (PHQ2-9): Low Risk  (09/21/2022)  Recent Concern: Depression (PHQ2-9) - Medium Risk (08/22/2022)  Financial Resource Strain: Low Risk  (09/21/2022)  Physical Activity: Inactive (09/21/2022)  Social Connections: Socially Integrated (09/21/2022)  Stress: No Stress Concern Present (01/14/2023)   Tobacco Use: Low Risk  (03/10/2023)    Readmission Risk Interventions     No data to display

## 2023-03-13 NOTE — Telephone Encounter (Signed)
Per note from social worker today- patient will be going to [facility] for rehab

## 2023-03-13 NOTE — Progress Notes (Signed)
PROGRESS NOTE  Kathleen [name] KZS:[id number] DOB: 11-15-1960   PCP: [name], MD  Patient is from: Home  DOA: 03/10/2023 LOS: 3  Chief complaints Chief Complaint  Patient presents with   Fall     Brief Narrative / Interim history: 62 year old female with a history of chronic respiratory failure on 4 L, fibromyalgia, anxiety/depression, opioid dependence, adrenal insufficiency, hypothyroidism, sarcoidosis, pulmonary fibrosis presenting with a mechanical fall when she was getting out of bed in the early a.m. 03/10/2023.  She had pain in the left leg and was unable to get up.  EMS was activated but the patient to the emergency department.  X-ray showed an acute closed bimalleolar fracture with lateral dislocation of the left ankle.   In addition, the patient states that she has been a little more short of breath than usual for the past 2 days.  She complains of a largely nonproductive cough.  She denies any fevers, chills, chest pain, nausea, vomiting or abdominal pain.  There is no hemoptysis, there is no hematochezia or melena.  At baseline, the patient is on 4 L nasal cannula.  She has never smoked.   In the ED, the patient was afebrile and hemodynamically stable with oxygen saturation 94-95% on 4 L.  WBC 14.9, hemoglobin 10.5, platelets 401,000.  Sodium 136, potassium 3.5, bicarbonate 30, serum creatinine 0.8.  Chest x-ray showed chronic interstitial changes and distorted architecture without any acute findings.  Patient underwent ORIF by Dr. Christell Constant on 7/14.   Subjective: Seen and examined earlier this morning.  Sleepy but wakes to voice.  Asked for something to drink but refused when I brought a water asking for soda.  Reports sharp pain in left leg.  Also reports back pain.  She says she has not a bowel movement since admission.  Objective: Vitals:   03/12/23 2024 03/13/23 0455 03/13/23 0832 03/13/23 0839  BP:  127/77  121/66  Pulse:  74 78 88  Resp:  17 18   Temp:  98.3 F  (36.8 C)    TempSrc:  Oral    SpO2: 97% 97% 98% 99%  Weight:      Height:        Examination:  GENERAL: No apparent distress.  Nontoxic. HEENT: MMM.  Vision and hearing grossly intact.  NECK: Supple.  No apparent JVD.  RESP:  No IWOB.  Fair aeration bilaterally. CVS:  RRR. Heart sounds normal.  ABD/GI/GU: BS+. Abd soft, NTND.  MSK/EXT:  Moves extremities.  Bulky dressing LLE including left foot.  Neurovascular intact SKIN: no apparent skin lesion or wound NEURO: Sleepy but wakes to voice.  Fairly oriented. No apparent focal neuro deficit. PSYCH: Calm. Normal affect.   Procedures:  7/14-ORIF of trimalleolar ankle fracture  Microbiology summarized: None  Assessment and plan: Principal Problem:   Ankle fracture, bimalleolar, closed, left, sequela Active Problems:   Interstitial lung disease (HCC)   Chronic respiratory failure with hypoxia (HCC)   Class 2 severe obesity due to excess calories with serious comorbidity and body mass index (BMI) of 38.0 to 38.9 in adult [facility])   Chronic pain syndrome   Opioid dependence (HCC)   Closed fracture of left ankle   Syndesmotic disruption of ankle, left, initial encounter  Closed left bimalleolar ankle fracture due to mechanical fall -S/p ORIF by Dr. Christell Constant on 7/14 -Pain control and VTE prophylaxis per surgery -Bowel regimen as below -Therapy recommended SNF.  TOC working on placement   Interstitial lung disease/sarcoidosis exacerbation: COVID-19  negative.  CT angio chest on 7/2 negative for PE but scattered scarring and bronchiectasis.  Improved with Solu-Medrol.  Steroid wean to baseline 5 mg prednisone..  -Continue home prednisone, Brovana, Pulmicort and DuoNebs.   Chronic respiratory failure with hypoxia: On 4 L at baseline.  Stable.   Adrenal insufficiency -Continued on Solu-Cortef 20 mg in a.m. and 10 mg at lunchtime.  Also on prednisone 5 mg daily.   Opioid dependence/chronic pain: PDMP reviewed -Oxycontin ER 40 mg, #28,  last refill 03/01/2023 -Oxy IR 5 mg, #56, last refill 03/01/2023   Anxiety/depression/fibromyalgia -Resume escitalopram and hydroxyzine   Hyperlipidemia -Continue statin   Hypothyroidism -Continue levothyroxine  Constipation: LBM before admission per patient. -Continue MiraLAX daily -Continue Linzess -Senokot-S 2 tablets twice daily  Class II obesity Body mass index is 36.55 kg/m. -Encourage lifestyle change to lose weight         DVT prophylaxis:  heparin injection 5,000 Units Start: 03/11/23 1400  Code Status: Full code Family Communication: None at bedside Level of care: Med-Surg Status is: Inpatient Remains inpatient appropriate because: Placement/SNF   Final disposition: SNF Consultants:  Orthopedic surgery  35 minutes with more than 50% spent in reviewing records, counseling patient/family and coordinating care.   Sch Meds:  Scheduled Meds:  arformoterol  15 mcg Nebulization BID   budesonide (PULMICORT) nebulizer solution  0.5 mg Nebulization BID   busPIRone  15 mg Oral TID   Chlorhexidine Gluconate Cloth  6 each Topical Daily   DULoxetine  60 mg Oral Daily   heparin  5,000 Units Subcutaneous Q8H   hydrocortisone  10 mg Oral Daily   hydrocortisone  20 mg Oral Daily   ipratropium-albuterol  3 mL Nebulization Q6H   levothyroxine  50 mcg Oral QAC breakfast   linaclotide  290 mcg Oral QAC breakfast   nystatin   Topical BID   oxyCODONE  40 mg Oral Q12H   pantoprazole  40 mg Oral BID AC   polyethylene glycol  17 g Oral Daily   predniSONE  10 mg Oral q AM   rosuvastatin  10 mg Oral Daily   Continuous Infusions:  tranexamic acid     PRN Meds:.acetaminophen **OR** acetaminophen, HYDROmorphone (DILAUDID) injection, hydrOXYzine, ondansetron **OR** ondansetron (ZOFRAN) IV, senna-docusate, traZODone  Antimicrobials: Anti-infectives (From admission, onward)    Start     Dose/Rate Route Frequency Ordered Stop   03/11/23 0600  ceFAZolin (ANCEF) IVPB 2g/100 mL  premix  Status:  Discontinued        2 g 200 mL/hr over 30 Minutes Intravenous On call to O.R. 03/10/23 1444 03/10/23 1454   03/11/23 0000  ceFAZolin (ANCEF) IVPB 2g/100 mL premix        2 g 200 mL/hr over 30 Minutes Intravenous Every 8 hours 03/10/23 2056 03/11/23 1718   03/10/23 1545  ceFAZolin (ANCEF) IVPB 2g/100 mL premix        2 g 200 mL/hr over 30 Minutes Intravenous On call to O.R. 03/10/23 1454 03/10/23 1710   03/10/23 1511  ceFAZolin (ANCEF) 2-4 GM/100ML-% IVPB       Note to Pharmacy: Crissie Sickles: cabinet override      03/10/23 1511 03/10/23 1644        I have personally reviewed the following labs and images: CBC: Recent Labs  Lab 03/10/23 0621 03/11/23 0913 03/12/23 0152 03/13/23 0102  WBC 14.9* 12.5* 12.1* 11.8*  HGB 10.5* 8.8* 8.7* 9.1*  HCT 34.3* 29.1* 29.1* 30.7*  MCV 95.0 93.3 95.7 96.2  PLT 401* 372 367 380   BMP &GFR Recent Labs  Lab 03/10/23 0621 03/11/23 0913 03/12/23 0152 03/13/23 0102  NA 136 137 138 139  K 3.5 4.6 5.0 4.8  CL 97* 95* 97* 98  CO2 30 27 34* 33*  GLUCOSE 124* 256* 146* 120*  BUN 10 12 11 14   CREATININE 0.88 1.04* 0.90 0.90  CALCIUM 8.5* 8.8* 8.9 8.6*   Estimated Creatinine Clearance: 85.9 mL/min (by C-G formula based on SCr of 0.9 mg/dL). Liver & Pancreas: Recent Labs  Lab 03/12/23 0152 03/13/23 0102  AST 13* 12*  ALT 15 13  ALKPHOS 88 85  BILITOT 0.2* 0.2*  PROT 5.6* 5.7*  ALBUMIN 3.0* 3.0*   No results for input(s): "LIPASE", "AMYLASE" in the last 168 hours. No results for input(s): "AMMONIA" in the last 168 hours. Diabetic: No results for input(s): "HGBA1C" in the last 72 hours. Recent Labs  Lab 03/10/23 1916  GLUCAP 157*   Cardiac Enzymes: No results for input(s): "CKTOTAL", "CKMB", "CKMBINDEX", "TROPONINI" in the last 168 hours. No results for input(s): "PROBNP" in the last 8760 hours. Coagulation Profile: No results for input(s): "INR", "PROTIME" in the last 168 hours. Thyroid Function Tests: No  results for input(s): "TSH", "T4TOTAL", "FREET4", "T3FREE", "THYROIDAB" in the last 72 hours. Lipid Profile: No results for input(s): "CHOL", "HDL", "LDLCALC", "TRIG", "CHOLHDL", "LDLDIRECT" in the last 72 hours. Anemia Panel: No results for input(s): "VITAMINB12", "FOLATE", "FERRITIN", "TIBC", "IRON", "RETICCTPCT" in the last 72 hours. Urine analysis:    Component Value Date/Time   COLORURINE STRAW (A) 03/11/2023 1803   APPEARANCEUR CLEAR 03/11/2023 1803   LABSPEC 1.005 03/11/2023 1803   PHURINE 6.0 03/11/2023 1803   GLUCOSEU 50 (A) 03/11/2023 1803   GLUCOSEU NEGATIVE 05/29/2010 1622   HGBUR NEGATIVE 03/11/2023 1803   BILIRUBINUR NEGATIVE 03/11/2023 1803   BILIRUBINUR + 11/08/2020 1604   KETONESUR NEGATIVE 03/11/2023 1803   PROTEINUR NEGATIVE 03/11/2023 1803   UROBILINOGEN 1.0 04/30/2020 0831   UROBILINOGEN 0.2 06/16/2014 0942   NITRITE NEGATIVE 03/11/2023 1803   LEUKOCYTESUR NEGATIVE 03/11/2023 1803   Sepsis Labs: Invalid input(s): "PROCALCITONIN", "LACTICIDVEN"  Microbiology: Recent Results (from the past 240 hour(s))  SARS Coronavirus 2 by RT PCR (hospital order, performed in [facility] hospital lab) *cepheid single result test* Anterior Nasal Swab     Status: None   Collection Time: 03/10/23 12:23 PM   Specimen: Anterior Nasal Swab  Result Value Ref Range Status   SARS Coronavirus 2 by RT PCR NEGATIVE NEGATIVE Final    Comment: (NOTE) SARS-CoV-2 target nucleic acids are NOT DETECTED.  The SARS-CoV-2 RNA is generally detectable in upper and lower respiratory specimens during the acute phase of infection. The lowest concentration of SARS-CoV-2 viral copies this assay can detect is 250 copies / mL. A negative result does not preclude SARS-CoV-2 infection and should not be used as the sole basis for treatment or other patient management decisions.  A negative result may occur with improper specimen collection / handling, submission of specimen other than nasopharyngeal  swab, presence of viral mutation(s) within the areas targeted by this assay, and inadequate number of viral copies (<250 copies / mL). A negative result must be combined with clinical observations, patient history, and epidemiological information.  Fact Sheet for Patients:   RoadLapTop.co.za  Fact Sheet for Healthcare Providers: http://kim-miller.com/  This test is not yet approved or  cleared by the [place] FDA and has been authorized for detection and/or diagnosis of SARS-CoV-2 by FDA under an  Emergency Use Authorization (EUA).  This EUA will remain in effect (meaning this test can be used) for the duration of the COVID-19 declaration under Section 564(b)(1) of the Act, 21 U.S.C. section 360bbb-3(b)(1), unless the authorization is terminated or revoked sooner.  Performed at [facility], [address] [place], [place] [address]   Surgical pcr screen     Status: None   Collection Time: 03/10/23  3:10 PM   Specimen: Nasal Mucosa; Nasal Swab  Result Value Ref Range Status   MRSA, PCR NEGATIVE NEGATIVE Final   Staphylococcus aureus NEGATIVE NEGATIVE Final    Comment: (NOTE) The Xpert SA Assay (FDA approved for NASAL specimens in patients 35 years of age and older), is one component of a comprehensive surveillance program. It is not intended to diagnose infection nor to guide or monitor treatment. Performed at [facility], [address], [place], [place] [address]     Radiology Studies: No results found.    Eura Mccauslin T. Katrese Shell [facility]  If 7PM-7AM, please contact night-coverage [email or web address] 03/13/2023, 1:49 PM

## 2023-03-13 NOTE — Progress Notes (Signed)
Physical Therapy Treatment Patient Details Name: Kathleen [name] MRN: [id number] DOB: Dec 30, 1960 Today's Date: 03/13/2023   History of Present Illness Pt is a 62 year old female admitted on 03/10/23 after a mechanical fall when she was getting out of bed in the early a.m. 03/10/2023.  She had pain in the left leg and was unable to get up.  EMS was activated but the patient to the emergency department.  X-ray showed an acute closed bimalleolar fracture with lateral dislocation of the left ankle. Past history of chronic respiratory failure on 4 L, fibromyalgia, anxiety/depression, opioid dependence, adrenal insufficiency, hypothyroidism, sarcoidosis, pulmonary fibrosis    PT Comments  Pt with fair tolerance to treatment today. Pt was able to transfer to chair via squat pivot +2 Max A today. No change in DC/DME recs at this time. PT will continue to follow.    Assistance Recommended at Discharge Frequent or constant Supervision/Assistance  If plan is discharge home, recommend the following:  Can travel by private vehicle    Two people to help with walking and/or transfers;A lot of help with bathing/dressing/bathroom;Assistance with cooking/housework;Direct supervision/assist for medications management;Assist for transportation;Help with stairs or ramp for entrance   No  Equipment Recommendations  Other (comment) (Per accepting facility)    Recommendations for Other Services OT consult     Precautions / Restrictions Precautions Precautions: Fall Restrictions Weight Bearing Restrictions: Yes LLE Weight Bearing: Non weight bearing     Mobility  Bed Mobility Overal bed mobility: Needs Assistance Bed Mobility: Supine to Sit     Supine to sit: Supervision     General bed mobility comments: use of bedrails and increased time    Transfers Overall transfer level: Needs assistance Equipment used: 2 person hand held assist, Rolling walker (2 wheels) Transfers: Sit to/from Stand, Bed to  chair/wheelchair/BSC Sit to Stand: +2 physical assistance, Max assist, From elevated surface     Squat pivot transfers: +2 physical assistance, Max assist     General transfer comment: Pt stated today that she is not able to put much weight through her RLE due to a previous fracture on that foot. Pt requested squat pivot transfer to chair after barely being able to stand with RW.    Ambulation/Gait               General Gait Details: Pt unable   Stairs             Wheelchair Mobility     Tilt Bed    Modified Rankin (Stroke Patients Only)       Balance Overall balance assessment: Needs assistance, History of Falls Sitting-balance support: Bilateral upper extremity supported, Feet unsupported Sitting balance-Leahy Scale: Fair Sitting balance - Comments: Able to EOB for ~5 minutes   Standing balance support: Bilateral upper extremity supported, During functional activity, Reliant on assistive device for balance Standing balance-Leahy Scale: Poor Standing balance comment: Reliant on RW or 2 person HHA                            Cognition Arousal/Alertness: Awake/alert Behavior During Therapy: WFL for tasks assessed/performed Overall Cognitive Status: Within Functional Limits for tasks assessed                                          Exercises      General Comments General  comments (skin integrity, edema, etc.): VSS on 4L      Pertinent Vitals/Pain Pain Assessment Pain Assessment: Faces Faces Pain Scale: Hurts little more Pain Location: L foot Pain Descriptors / Indicators: Discomfort, Grimacing Pain Intervention(s): Patient requesting pain meds-RN notified, Monitored during session, Limited activity within patient's tolerance, Premedicated before session, Repositioned    Home Living                          Prior Function            PT Goals (current goals can now be found in the care plan section)  Progress towards PT goals: Progressing toward goals    Frequency    Min 3X/week      PT Plan Current plan remains appropriate    Co-evaluation              AM-PAC PT "6 Clicks" Mobility   Outcome Measure  Help needed turning from your back to your side while in a flat bed without using bedrails?: A Little Help needed moving from lying on your back to sitting on the side of a flat bed without using bedrails?: A Little Help needed moving to and from a bed to a chair (including a wheelchair)?: A Lot Help needed standing up from a chair using your arms (e.g., wheelchair or bedside chair)?: A Lot Help needed to walk in hospital room?: Total Help needed climbing 3-5 steps with a railing? : Total 6 Click Score: 12    End of Session Equipment Utilized During Treatment: Gait belt;Oxygen Activity Tolerance: Patient tolerated treatment well Patient left: in chair;with call bell/phone within reach;with nursing/sitter in room Nurse Communication: Mobility status;Need for lift equipment PT Visit Diagnosis: Other abnormalities of gait and mobility (R26.89)     Time: 0981-1914 PT Time Calculation (min) (ACUTE ONLY): 27 min  Charges:    $Therapeutic Activity: 23-37 mins PT General Charges $$ ACUTE PT VISIT: 1 Visit                     Shela Nevin, PT, DPT Acute Rehab Services [phone]    Gladys Damme 03/13/2023, 12:26 PM

## 2023-03-14 DIAGNOSIS — W19XXXA Unspecified fall, initial encounter: Secondary | ICD-10-CM | POA: Diagnosis not present

## 2023-03-14 DIAGNOSIS — S82842S Displaced bimalleolar fracture of left lower leg, sequela: Secondary | ICD-10-CM | POA: Diagnosis not present

## 2023-03-14 DIAGNOSIS — J849 Interstitial pulmonary disease, unspecified: Secondary | ICD-10-CM | POA: Diagnosis not present

## 2023-03-14 DIAGNOSIS — Z9181 History of falling: Secondary | ICD-10-CM | POA: Diagnosis not present

## 2023-03-14 DIAGNOSIS — S93432A Sprain of tibiofibular ligament of left ankle, initial encounter: Secondary | ICD-10-CM | POA: Diagnosis not present

## 2023-03-14 DIAGNOSIS — S82842D Displaced bimalleolar fracture of left lower leg, subsequent encounter for closed fracture with routine healing: Secondary | ICD-10-CM | POA: Diagnosis not present

## 2023-03-14 DIAGNOSIS — J9611 Chronic respiratory failure with hypoxia: Secondary | ICD-10-CM | POA: Diagnosis not present

## 2023-03-14 DIAGNOSIS — R069 Unspecified abnormalities of breathing: Secondary | ICD-10-CM | POA: Diagnosis not present

## 2023-03-14 DIAGNOSIS — G894 Chronic pain syndrome: Secondary | ICD-10-CM | POA: Diagnosis not present

## 2023-03-14 DIAGNOSIS — F1129 Opioid dependence with unspecified opioid-induced disorder: Secondary | ICD-10-CM | POA: Diagnosis not present

## 2023-03-14 DIAGNOSIS — Z9981 Dependence on supplemental oxygen: Secondary | ICD-10-CM | POA: Diagnosis not present

## 2023-03-14 DIAGNOSIS — Z6838 Body mass index (BMI) 38.0-38.9, adult: Secondary | ICD-10-CM | POA: Diagnosis not present

## 2023-03-14 DIAGNOSIS — S82892A Other fracture of left lower leg, initial encounter for closed fracture: Secondary | ICD-10-CM | POA: Diagnosis not present

## 2023-03-14 DIAGNOSIS — J449 Chronic obstructive pulmonary disease, unspecified: Secondary | ICD-10-CM | POA: Diagnosis not present

## 2023-03-14 DIAGNOSIS — Z743 Need for continuous supervision: Secondary | ICD-10-CM | POA: Diagnosis not present

## 2023-03-14 NOTE — Consult Note (Signed)
   [facility] CM Inpatient Consult   03/14/2023  Kathleen [name] 05-29-1961 [id number]  [facility] [[facility]]  Accountable Care Organization [ACO] Patient: [employer]  Primary Care Provider:  [name], MD with [facility]   Patient is currently active with [facility] [[facility]] Care Management for community care coordination services.  Patient has been engaged by a [employer] on behalf of [facility].  The community based plan of care has focused on disease management and community resource support.  Patient's electronic medical record reviewed for needs and currently recommended for SNF for rehab due to ORIF left ankle fx due to fall at home and noted in inpatient [facility] notes as patient's husband is also a patient in a different hospital. Potential Barriers: Insurance authorization and bed offers per latest inpatient TOC notes. Attempts to speak with patient at bedside, will continue to follow progress and disposition needs.  Plan: Following for any additional post hospital support needs for RN Care Coordinator.  Noted an upcoming appointment with RN CC on 03/18/23.  Will reach out to RN CC of admission and follow up. If patient goes to SNF then post hospital care needs are to be managed at that level of care.  Of note, [facility] Care Management services does not replace or interfere with any services that are needed or arranged by inpatient [facility] care management team.   For additional questions or referrals please contact:  Charlesetta Shanks, RN BSN CCM [facility]  [phone] business mobile phone Toll free office [phone]  *Concierge Line  [phone] Fax number: [phone] [email or web address]

## 2023-03-14 NOTE — Discharge Summary (Signed)
Physician Discharge Summary  Kathleen [name] NFA:[id number] DOB: 03-06-1961 DOA: 03/10/2023  PCP: [name], MD  Admit date: 03/10/2023 Discharge date: 03/14/2023 Admitted From: Home Disposition: SNF Recommendations for Outpatient Follow-up:  Outpatient follow-up with orthopedic surgery as below Check CMP and CBC at follow-up Please follow up on the following pending results: None   Discharge Condition: Stable CODE STATUS: Full code  Follow-up Information     Kathleen Sheer, MD. Schedule an appointment as soon as possible for a visit in 2 week(s).   Specialty: Orthopedic Surgery Contact information: [address] [place] [address] [phone]                 Hospital course 62 year old female with a history of chronic respiratory failure on 4 L, fibromyalgia, anxiety/depression, opioid dependence, adrenal insufficiency, hypothyroidism, sarcoidosis, pulmonary fibrosis presenting with a mechanical fall when she was getting out of bed in the early a.m. 03/10/2023.  She had pain in the left leg and was unable to get up.  EMS was activated but the patient to the emergency department.  X-ray showed an acute closed bimalleolar fracture with lateral dislocation of the left ankle.   In addition, the patient states that she has been a little more short of breath than usual for the past 2 days.  She complains of a largely nonproductive cough.  She denies any fevers, chills, chest pain, nausea, vomiting or abdominal pain.  There is no hemoptysis, there is no hematochezia or melena.  At baseline, the patient is on 4 L nasal cannula.  She has never smoked.   In the ED, the patient was afebrile and hemodynamically stable with oxygen saturation 94-95% on 4 L.  WBC 14.9, hemoglobin 10.5, platelets 401,000.  Sodium 136, potassium 3.5, bicarbonate 30, serum creatinine 0.8.  Chest x-ray showed chronic interstitial changes and distorted architecture without any acute findings.    Patient underwent ORIF by Dr. Christell Constant on 7/14.  No significant postop complication.  Discharged to SNF per recommendation by therapy.  Outpatient follow-up with orthopedic surgery as above.  See individual problem list below for more.   Problems addressed during this hospitalization Principal Problem:   Ankle fracture, bimalleolar, closed, left, sequela Active Problems:   Interstitial lung disease (HCC)   Chronic respiratory failure with hypoxia (HCC)   Class 2 severe obesity due to excess calories with serious comorbidity and body mass index (BMI) of 38.0 to 38.9 in adult [facility])   Chronic pain syndrome   Opioid dependence (HCC)   Closed fracture of left ankle   Syndesmotic disruption of ankle, left, initial encounter   Closed left bimalleolar ankle fracture due to mechanical fall -S/p ORIF by Dr. Christell Constant on 7/14 -Pain control and VTE prophylaxis per surgery.  See med list below. -Bowel regimen as below -Therapy recommended SNF.    Interstitial lung disease/sarcoidosis exacerbation: COVID-19 negative.  CT angio chest on 7/2 negative for PE but scattered scarring and bronchiectasis.  Improved with Solu-Medrol.  Steroid wean to home prednisone 5 mg daily. -Continue home prednisone,, DuoNebs and Pulmicort.   Chronic respiratory failure with hypoxia: On 4 L at baseline.  Stable.   Adrenal insufficiency: Stable. -Continued on Solu-Cortef 20 mg in a.m. and 10 mg at lunchtime.  Also on prednisone 5 mg daily.   Opioid dependence/chronic pain: PDMP reviewed -Oxycontin ER 40 mg, #28, last refill 03/01/2023 -Oxy IR 5 mg, #56, last refill 03/01/2023   Anxiety/depression/fibromyalgia -Continue home meds.  Chronic urinary retention: Reports intermittent  self catheterization at baseline.   Hyperlipidemia -Continue statin   Hypothyroidism -Continue levothyroxine   Constipation:  -Continue MiraLAX, Senokot-S and Linzess.    Class II obesity Body mass index is 36.55 kg/m.             Time spent 35 minutes  Vital signs Vitals:   03/14/23 0430 03/14/23 0853 03/14/23 0854 03/14/23 0856  BP: (!) 126/56 (!) 112/56    Pulse: 81 91    Temp:  97.9 F (36.6 C)    Resp: 18 18    Height:      Weight:      SpO2: 99% 98% 98% 98%  TempSrc:      BMI (Calculated):         Discharge exam  GENERAL: No apparent distress.  Nontoxic. HEENT: MMM.  Vision and hearing grossly intact.  NECK: Supple.  No apparent JVD.  RESP:  No IWOB.  Fair aeration bilaterally. CVS:  RRR. Heart sounds normal.  ABD/GI/GU: BS+. Abd soft, NTND.  MSK/EXT:  Moves extremities.  Dressing over left leg DCI.  Neurovascular intact. SKIN: no apparent skin lesion or wound NEURO: Awake and alert. Oriented appropriately.  No apparent focal neuro deficit. PSYCH: Calm. Normal affect.   Discharge Instructions Discharge Instructions     Diet general   Complete by: As directed    Increase activity slowly   Complete by: As directed       Allergies as of 03/14/2023       Reactions   Bee Venom Anaphylaxis   Contrast Media [iodinated Contrast Media]    CT chest w/ contrast at OSH followed by SOB resolved w/ single dose steroids, never ENT swelling, intubation or pressors, has had multiple contrasted scans since   Bactrim [sulfamethoxazole-trimethoprim] Nausea And Vomiting   Codeine Itching   Morphine And Codeine Nausea And Vomiting, Other (See Comments)   Dizziness  Confusion        Medication List     STOP taking these medications    escitalopram 10 MG tablet Commonly known as: LEXAPRO   formoterol 20 MCG/2ML nebulizer solution Commonly known as: PERFOROMIST   valACYclovir 1000 MG tablet Commonly known as: VALTREX       TAKE these medications    acetaminophen 500 MG tablet Commonly known as: TYLENOL Take 2 tablets (1,000 mg total) by mouth every 8 (eight) hours for 14 days. What changed:  when to take this reasons to take this   albuterol 108 (90 Base) MCG/ACT  inhaler Commonly known as: VENTOLIN HFA Inhale 2 puffs into the lungs every 6 (six) hours as needed for wheezing or shortness of breath.   albuterol (2.5 MG/3ML) 0.083% nebulizer solution Commonly known as: PROVENTIL Take 3 mLs (2.5 mg total) by nebulization every 4 (four) hours as needed for wheezing or shortness of breath.   aspirin EC 81 MG tablet Take 1 tablet (81 mg total) by mouth 2 (two) times daily. Swallow whole. What changed:  when to take this additional instructions   budesonide 0.5 MG/2ML nebulizer solution Commonly known as: PULMICORT Inhale the contents of 1 vial via nebulizer two (2) times a day.   busPIRone 15 MG tablet Commonly known as: BUSPAR Take 1 tablet (15 mg) by mouth 3 times daily. What changed: Another medication with the same name was removed. Continue taking this medication, and follow the directions you see here.   calcitonin (salmon) 200 UNIT/ACT nasal spray Commonly known as: Miacalcin Use 1 spray in one nostril per day,  alternate nostrils every other day (may have 1 vial she should use this for at least 2 weeks but may use as long as 4 weeks)   DULoxetine 60 MG capsule Commonly known as: Cymbalta Take 1 capsule (60 mg) by mouth daily.   EPINEPHrine 0.3 mg/0.3 mL Soaj injection Commonly known as: EPI-PEN Inject 0.3 mg into the muscle as needed for anaphylaxis.   ferrous sulfate 325 (65 FE) MG EC tablet Take 325 mg by mouth once a week.   hydrocortisone 10 MG tablet Commonly known as: CORTEF Take 2 tablets by mouth at 8:00 am and 1 tablet at lunch. (May double dose on sick days x 3-5 days) What changed:  how much to take how to take this when to take this additional instructions   hydrOXYzine 25 MG tablet Commonly known as: ATARAX Take 1 tablet (25 mg total) by mouth 3 (three) times daily as needed for anxiety.   ipratropium-albuterol 0.5-2.5 (3) MG/3ML Soln Commonly known as: DUONEB Inhale 1 vial via nebulizer every 8 hours.    levothyroxine 50 MCG tablet Commonly known as: SYNTHROID Take 1 tablet by mouth daily before breakfast.   Linzess 290 MCG Caps capsule Generic drug: linaclotide Take 1 capsule (290 mcg total) by mouth daily before breakfast.   naloxone 4 MG/0.1ML Liqd nasal spray kit Commonly known as: NARCAN Use as directed for opioid overdose   ondansetron 8 MG disintegrating tablet Commonly known as: ZOFRAN-ODT Take 8 mg by mouth every 8 (eight) hours as needed for nausea or vomiting.   oxyCODONE 5 MG immediate release tablet Commonly known as: Roxicodone Take 1-2 tablets (5-10 mg total) by mouth every 4 (four) hours as needed for up to 7 days for severe pain. What changed:  how much to take when to take this reasons to take this   oxyCODONE 40 mg 12 hr tablet Commonly known as: OxyCONTIN Take 1 tablet (40 mg total) by mouth every 12 (twelve) hours. What changed: Another medication with the same name was changed. Make sure you understand how and when to take each.   pantoprazole 40 MG tablet Commonly known as: PROTONIX Take 1 tablet (40 mg total) by mouth 2 (two) times daily before a meal.   polyethylene glycol 17 g packet Commonly known as: MIRALAX / GLYCOLAX Take 17 g by mouth daily for 14 days.   predniSONE 5 MG tablet Commonly known as: DELTASONE Take 1 tablet (5 mg total) by mouth in the morning. What changed: Another medication with the same name was removed. Continue taking this medication, and follow the directions you see here.   rosuvastatin 10 MG tablet Commonly known as: Crestor Take 1 tablet (10 mg total) by mouth daily.   senna-docusate 8.6-50 MG tablet Commonly known as: Senokot-S Take 1 tablet by mouth 2 (two) times daily between meals as needed for mild constipation.        Consultations: Orthopedic surgery  Procedures/Studies: ORIF of left ankle fracture by Dr. Yehuda Budd on 7/14   MR THORACIC SPINE WO CONTRAST  Result Date: 03/11/2023 CLINICAL DATA:   Compression fracture, thoracic. EXAM: MRI THORACIC SPINE WITHOUT CONTRAST TECHNIQUE: Multiplanar, multisequence MR imaging of the thoracic spine was performed. No intravenous contrast was administered. COMPARISON:  CT chest February 26, 2023. FINDINGS: Alignment:  Mild levoconvex curvature. Vertebrae: Compression fracture of the superior endplate of T11 with associated marrow edema, consistent with a subacute fracture. There is approximately 25% vertebral body height loss. No significant retropulsion. No evidence of discitis, or aggressive  bone lesion. Cord: Evaluation is somewhat limited by motion artifacts. No gross cord signal abnormality identified. Paraspinal and other soft tissues: Negative. Disc levels: Epidural lipomatosis causes mild narrowing of the thecal sac along the thoracic spine, particularly in the upper thoracic region. No significant disc bulge or herniation, osseous spinal canal or neural foraminal stenosis. IMPRESSION: 1. Subacute compression fracture of the superior endplate of T11 with approximately 25% vertebral body height loss. No significant retropulsion. 2. No high-grade spinal canal or neural foraminal stenosis. Electronically Signed   By: Baldemar Lenis M.D.   On: 03/11/2023 13:30   DG Ankle Complete Left  Result Date: 03/10/2023 CLINICAL DATA:  Postop EXAM: LEFT ANKLE COMPLETE - 3+ VIEW COMPARISON:  03/10/2023 FINDINGS: Casting material limits osseous detail. Interval surgical plate and multiple screw fixation of the distal fibula with additional screw fixation medial malleolus of the tibia. There is anatomic alignment. Reduction of previously noted lateral talar subluxation. IMPRESSION: Interval ORIF of distal fibular and medial malleolar fractures with anatomic alignment. Electronically Signed   By: Jasmine Pang M.D.   On: 03/10/2023 22:08   DG Ankle Complete Left  Result Date: 03/10/2023 CLINICAL DATA:  Open reduction internal fixation of ankle fracture EXAM: LEFT  ANKLE COMPLETE - 3+ VIEW COMPARISON:  Left ankle x-ray 03/10/2023 FINDINGS: Three intraoperative fluoroscopic views of the left ankle. Lateral sideplate and screws and medial malleolar screws are present fixating medial malleolar and distal fibular fractures. Alignment is anatomic. There is a nondisplaced posterior malleolar fracture. There is soft tissue swelling surrounding the ankle. Fluoroscopy time: 34.7 seconds.  Fluoroscopy dose 0.41 micro gray. IMPRESSION: 1. Open reduction internal fixation of medial malleolar and distal fibular fractures. Alignment is anatomic. 2. Nondisplaced posterior malleolar fracture. Electronically Signed   By: Darliss Cheney M.D.   On: 03/10/2023 21:27   DG C-Arm 1-60 Min-No Report  Result Date: 03/10/2023 Fluoroscopy was utilized by the requesting physician.  No radiographic interpretation.   DG C-Arm 1-60 Min-No Report  Result Date: 03/10/2023 Fluoroscopy was utilized by the requesting physician.  No radiographic interpretation.   DG Ankle Complete Left  Result Date: 03/10/2023 CLINICAL DATA:  Status post reduction. EXAM: LEFT ANKLE COMPLETE - 3+ VIEW COMPARISON:  Left ankle x-rays from same day. FINDINGS: Improved alignment of the medial and lateral malleolar fractures with residual lateral displacement and lateral tibiotalar subluxation. Suspected small avulsion fracture of the posterior malleolus. Joint spaces are preserved. Diffuse soft tissue swelling. IMPRESSION: 1. Improved alignment of the bimalleolar fracture-subluxation with residual lateral displacement. 2. Suspected small avulsion fracture of the posterior malleolus. Electronically Signed   By: Obie Dredge M.D.   On: 03/10/2023 08:08   DG Chest Port 1 View  Result Date: 03/10/2023 CLINICAL DATA:  62 year old female status post fall this morning. Shortness of breath, wheezing. EXAM: PORTABLE CHEST 1 VIEW COMPARISON:  CTA chest 02/26/2023 and earlier. FINDINGS: Portable AP upright view at 0705 hours.  Stable lung volumes from the recent CTA. Stable mediastinal contours, mediastinal lipomatosis. Gas in the thoracic esophagus now in addition to the trachea. Chronic lung disease with medial bilateral upper lobe architectural distortion again noted. No pneumothorax, pulmonary edema, pleural effusion, or new pulmonary opacity identified. Paucity bowel gas in the visible abdomen. Stable visualized osseous structures. IMPRESSION: 1. Chronic lung disease appears stable from July 2nd CTA. No acute cardiopulmonary abnormality. 2. Increased gas in the esophagus, favored due to air hunger/air swallowing. Electronically Signed   By: [place] Fleming M.D.   On: 03/10/2023  07:35   DG Ankle Complete Left  Result Date: 03/10/2023 CLINICAL DATA:  Left ankle fracture or dislocation. EXAM: LEFT ANKLE COMPLETE - 3+ VIEW COMPARISON:  None Available. FINDINGS: Mild osteopenia. Moderate soft tissue swelling. There are acute closed bimalleolar fractures with lateral dislocation of the foot. There is a transverse fracture of the medial malleolus at the malleolar base with tiny comminution fragments between fracture margins. There is oblique distal fibular diametaphyseal fracture with the lower fracture margin at the tibial plafond level and a few tiny comminution densities between fracture margins. The malleolar fragments remain with the talus and foot which is laterally dislocated such that the medial tibial plafond rests on the mid talar dome. There is mild lateral tilt of the talus and foot as well. The posterior malleolus is grossly intact. On the lateral view there is up to 1 bone width dorsal displacement of the lateral malleolar fragment relative to the parent bone. There is no widening of the distal tibiofibular interval. No soft tissue gas. Small plantar heel spur. IMPRESSION: 1. Acute closed bimalleolar fractures with lateral dislocation of the foot as detailed above. 2. Osteopenia. 3. Moderate soft tissue swelling. 4. Small plantar  heel spur. Electronically Signed   By: Almira Bar M.D.   On: 03/10/2023 06:37   CT Angio Chest PE W and/or Wo Contrast  Result Date: 02/26/2023 CLINICAL DATA:  Shortness of breath EXAM: CT ANGIOGRAPHY CHEST WITH CONTRAST TECHNIQUE: Multidetector CT imaging of the chest was performed using the standard protocol during bolus administration of intravenous contrast. Multiplanar CT image reconstructions and MIPs were obtained to evaluate the vascular anatomy. RADIATION DOSE REDUCTION: This exam was performed according to the departmental dose-optimization program which includes automated exposure control, adjustment of the mA and/or kV according to patient size and/or use of iterative reconstruction technique. CONTRAST:  75mL OMNIPAQUE IOHEXOL 350 MG/ML SOLN Patient was given 4 hour premedication COMPARISON:  12/18/2022 FINDINGS: Cardiovascular: Thoracic aorta shows no aneurysmal dilatation or dissection. No cardiac enlargement is seen. No significant coronary calcifications are noted. The pulmonary artery shows a normal branching pattern bilaterally. No filling defect to suggest pulmonary embolism is noted. Mediastinum/Nodes: Thoracic inlet is within normal limits. Mild mediastinal lipomatosis is noted. No hilar or mediastinal adenopathy is noted. The esophagus as visualized is within normal limits. Lungs/Pleura: Stable scarring and bronchiectasis is noted bilaterally. These changes are worse in the upper lobes bilaterally. No sizable effusion is seen. No acute infiltrate is noted. Upper Abdomen: Gallbladder has been surgically removed. Musculoskeletal: Degenerative changes of the thoracic spine are noted. No acute rib abnormality is seen. Vertebral body height loss is noted at T11 which is new from the prior examination but of uncertain chronicity. Sclerosis is noted suggesting a more chronic process. Correlate to point tenderness. Review of the MIP images confirms the above findings. IMPRESSION: No evidence of  pulmonary emboli. Scarring and bronchiectasis primarily within the upper lobes bilaterally. No acute abnormality is noted. T11 compression deformity new from the prior exam of 12/18/2022. Correlate with physical exam. Nonemergent MRI may helpful for further evaluation to assess chronicity. Electronically Signed   By: Alcide Clever M.D.   On: 02/26/2023 20:33   US Venous Img Lower Bilateral (DVT)  Result Date: 02/26/2023 CLINICAL DATA:  Bilateral lower extremity pain Elevated D-dimer EXAM: Bilateral lower Extremity Venous Doppler Ultrasound TECHNIQUE: Gray-scale sonography with compression, as well as color and duplex ultrasound, were performed to evaluate the deep venous system(s) from the level of the common femoral vein through  the popliteal and proximal calf veins. COMPARISON:  11/15/2020 FINDINGS: VENOUS Normal compressibility of the common femoral, superficial femoral, and popliteal veins, as well as the visualized calf veins. Visualized portions of profunda femoral vein and great saphenous vein unremarkable. No filling defects to suggest DVT on grayscale or color Doppler imaging. Doppler waveforms show normal direction of venous flow, normal respiratory plasticity and response to augmentation. OTHER None. Limitations: none IMPRESSION: No  lower extremity DVT. Electronically Signed   By: Acquanetta Belling M.D.   On: 02/26/2023 11:07       The results of significant diagnostics from this hospitalization (including imaging, microbiology, ancillary and laboratory) are listed below for reference.     Microbiology: Recent Results (from the past 240 hour(s))  SARS Coronavirus 2 by RT PCR (hospital order, performed in [facility] hospital lab) *cepheid single result test* Anterior Nasal Swab     Status: None   Collection Time: 03/10/23 12:23 PM   Specimen: Anterior Nasal Swab  Result Value Ref Range Status   SARS Coronavirus 2 by RT PCR NEGATIVE NEGATIVE Final    Comment: (NOTE) SARS-CoV-2 target nucleic  acids are NOT DETECTED.  The SARS-CoV-2 RNA is generally detectable in upper and lower respiratory specimens during the acute phase of infection. The lowest concentration of SARS-CoV-2 viral copies this assay can detect is 250 copies / mL. A negative result does not preclude SARS-CoV-2 infection and should not be used as the sole basis for treatment or other patient management decisions.  A negative result may occur with improper specimen collection / handling, submission of specimen other than nasopharyngeal swab, presence of viral mutation(s) within the areas targeted by this assay, and inadequate number of viral copies (<250 copies / mL). A negative result must be combined with clinical observations, patient history, and epidemiological information.  Fact Sheet for Patients:   RoadLapTop.co.za  Fact Sheet for Healthcare Providers: http://kim-miller.com/  This test is not yet approved or  cleared by the [place] FDA and has been authorized for detection and/or diagnosis of SARS-CoV-2 by FDA under an Emergency Use Authorization (EUA).  This EUA will remain in effect (meaning this test can be used) for the duration of the COVID-19 declaration under Section 564(b)(1) of the Act, 21 U.S.C. section 360bbb-3(b)(1), unless the authorization is terminated or revoked sooner.  Performed at [facility], [address], [place], [place] [address]   Surgical pcr screen     Status: None   Collection Time: 03/10/23  3:10 PM   Specimen: Nasal Mucosa; Nasal Swab  Result Value Ref Range Status   MRSA, PCR NEGATIVE NEGATIVE Final   Staphylococcus aureus NEGATIVE NEGATIVE Final    Comment: (NOTE) The Xpert SA Assay (FDA approved for NASAL specimens in patients 45 years of age and older), is one component of a comprehensive surveillance program. It is not intended to diagnose infection nor to guide or monitor treatment. Performed at [facility], [address] [place], [place] [address]      Labs:  CBC: Recent Labs  Lab 03/10/23 [phone] 03/11/23 0913 03/12/23 0152 03/13/23 0102  WBC 14.9* 12.5* 12.1* 11.8*  HGB 10.5* 8.8* 8.7* 9.1*  HCT 34.3* 29.1* 29.1* 30.7*  MCV 95.0 93.3 95.7 96.2  PLT 401* 372 367 380   BMP &GFR Recent Labs  Lab 03/10/23 0621 03/11/23 0913 03/12/23 0152 03/13/23 0102  NA 136 137 138 139  K 3.5 4.6 5.0 4.8  CL 97* 95* 97* 98  CO2 30  27 34* 33*  GLUCOSE 124* 256* 146* 120*  BUN 10 12 11 14   CREATININE 0.88 1.04* 0.90 0.90  CALCIUM 8.5* 8.8* 8.9 8.6*   Estimated Creatinine Clearance: 85.9 mL/min (by C-G formula based on SCr of 0.9 mg/dL). Liver & Pancreas: Recent Labs  Lab 03/12/23 0152 03/13/23 0102  AST 13* 12*  ALT 15 13  ALKPHOS 88 85  BILITOT 0.2* 0.2*  PROT 5.6* 5.7*  ALBUMIN 3.0* 3.0*   No results for input(s): "LIPASE", "AMYLASE" in the last 168 hours. No results for input(s): "AMMONIA" in the last 168 hours. Diabetic: No results for input(s): "HGBA1C" in the last 72 hours. Recent Labs  Lab 03/10/23 1916  GLUCAP 157*   Cardiac Enzymes: No results for input(s): "CKTOTAL", "CKMB", "CKMBINDEX", "TROPONINI" in the last 168 hours. No results for input(s): "PROBNP" in the last 8760 hours. Coagulation Profile: No results for input(s): "INR", "PROTIME" in the last 168 hours. Thyroid Function Tests: No results for input(s): "TSH", "T4TOTAL", "FREET4", "T3FREE", "THYROIDAB" in the last 72 hours. Lipid Profile: No results for input(s): "CHOL", "HDL", "LDLCALC", "TRIG", "CHOLHDL", "LDLDIRECT" in the last 72 hours. Anemia Panel: No results for input(s): "VITAMINB12", "FOLATE", "FERRITIN", "TIBC", "IRON", "RETICCTPCT" in the last 72 hours. Urine analysis:    Component Value Date/Time   COLORURINE STRAW (A) 03/11/2023 1803   APPEARANCEUR CLEAR 03/11/2023 1803   LABSPEC 1.005 03/11/2023 1803   PHURINE 6.0 03/11/2023 1803   GLUCOSEU 50 (A) 03/11/2023 1803    GLUCOSEU NEGATIVE 05/29/2010 1622   HGBUR NEGATIVE 03/11/2023 1803   BILIRUBINUR NEGATIVE 03/11/2023 1803   BILIRUBINUR + 11/08/2020 1604   KETONESUR NEGATIVE 03/11/2023 1803   PROTEINUR NEGATIVE 03/11/2023 1803   UROBILINOGEN 1.0 04/30/2020 0831   UROBILINOGEN 0.2 06/16/2014 0942   NITRITE NEGATIVE 03/11/2023 1803   LEUKOCYTESUR NEGATIVE 03/11/2023 1803   Sepsis Labs: Invalid input(s): "PROCALCITONIN", "LACTICIDVEN"   SIGNED:  Almon Hercules, MD  [facility] 03/14/2023, 11:14 AM

## 2023-03-14 NOTE — TOC Transition Note (Signed)
Transition of Care [facility]) - CM/SW Discharge Note   Patient Details  Name: Kathleen [name] MRN: [id number] Date of Birth: 1960-11-23  [facility]) CM/SW Contact:  Lorri Frederick, LCSW Phone Number: 03/14/2023, 1:35 PM   Clinical Narrative:   Pt discharging to [facility].  RN call report to [phone].    Final next level of care: Skilled Nursing Facility Barriers to Discharge: Barriers Resolved   Patient Goals and CMS Choice   Choice offered to / list presented to : Patient  Discharge Placement                Patient chooses bed at:  [facility]) Patient to be transferred to facility by: PTAR Name of family member notified: left message with husband Amada Jupiter, daughter Ashely Patient and family notified of of transfer: 03/14/23  Discharge Plan and Services Additional resources added to the After Visit Summary for     Discharge Planning Services: CM Consult Post Acute Care Choice: Skilled Nursing Facility                               Social Determinants of Health (SDOH) Interventions SDOH Screenings   Food Insecurity: No Food Insecurity (03/10/2023)  Housing: Low Risk  (03/10/2023)  Transportation Needs: No Transportation Needs (03/10/2023)  Utilities: Not At Risk (03/10/2023)  Alcohol Screen: Low Risk  (09/21/2022)  Depression (PHQ2-9): Low Risk  (09/21/2022)  Recent Concern: Depression (PHQ2-9) - Medium Risk (08/22/2022)  Financial Resource Strain: Low Risk  (09/21/2022)  Physical Activity: Inactive (09/21/2022)  Social Connections: Socially Integrated (09/21/2022)  Stress: No Stress Concern Present (01/14/2023)  Tobacco Use: Low Risk  (03/10/2023)     Readmission Risk Interventions     No data to display

## 2023-03-14 NOTE — TOC Progression Note (Addendum)
Transition of Care [facility]) - Progression Note    Patient Details  Name: Kathleen [name] MRN: [id number] Date of Birth: 1961/04/16  Transition of Care [facility]) CM/SW Contact  Lorri Frederick, LCSW Phone Number: 03/14/2023, 8:23 AM  Clinical Narrative:   PASSR received: [id number] E  1200: Message from [facility].  Can accept pt today if auth approved before 1400.   TC Megan, LCSW at [employer].  She will be sending husband referral to [facility] today as well.    1315: Auth approved by HTA: SNF, 7 days: 110108, PTAR: [id number].  SNF notified.  Expected Discharge Plan: Skilled Nursing Facility Barriers to Discharge: Continued Medical Work up, SNF Pending bed offer  Expected Discharge Plan and Services   Discharge Planning Services: CM Consult Post Acute Care Choice: Skilled Nursing Facility Living arrangements for the past 2 months: Single Family Home                                       Social Determinants of Health (SDOH) Interventions SDOH Screenings   Food Insecurity: No Food Insecurity (03/10/2023)  Housing: Low Risk  (03/10/2023)  Transportation Needs: No Transportation Needs (03/10/2023)  Utilities: Not At Risk (03/10/2023)  Alcohol Screen: Low Risk  (09/21/2022)  Depression (PHQ2-9): Low Risk  (09/21/2022)  Recent Concern: Depression (PHQ2-9) - Medium Risk (08/22/2022)  Financial Resource Strain: Low Risk  (09/21/2022)  Physical Activity: Inactive (09/21/2022)  Social Connections: Socially Integrated (09/21/2022)  Stress: No Stress Concern Present (01/14/2023)  Tobacco Use: Low Risk  (03/10/2023)    Readmission Risk Interventions     No data to display

## 2023-03-14 NOTE — Progress Notes (Signed)
MD, patient has been requiring Dilaudid iv through out the night.  She may benefit from Oxy IR PO in addition and use the Dilaudid for break through pain.  Patient stated that her pain is 10/10 and the Dilaudid only helps for a short time.   Patient also stated that Social Worker is suppose to come back and see her today.  She does need to speak with him again.

## 2023-03-17 ENCOUNTER — Ambulatory Visit (HOSPITAL_COMMUNITY): Payer: HMO

## 2023-03-17 DIAGNOSIS — R531 Weakness: Secondary | ICD-10-CM | POA: Diagnosis not present

## 2023-03-17 DIAGNOSIS — M80872A Other osteoporosis with current pathological fracture, left ankle and foot, initial encounter for fracture: Secondary | ICD-10-CM | POA: Diagnosis not present

## 2023-03-17 DIAGNOSIS — G8929 Other chronic pain: Secondary | ICD-10-CM | POA: Diagnosis not present

## 2023-03-18 ENCOUNTER — Ambulatory Visit: Payer: Self-pay | Admitting: *Deleted

## 2023-03-18 ENCOUNTER — Telehealth: Payer: Self-pay | Admitting: Orthopedic Surgery

## 2023-03-18 ENCOUNTER — Encounter: Payer: HMO | Admitting: *Deleted

## 2023-03-18 DIAGNOSIS — S82852A Displaced trimalleolar fracture of left lower leg, initial encounter for closed fracture: Secondary | ICD-10-CM

## 2023-03-18 NOTE — Patient Outreach (Signed)
  Care Coordination   Follow Up Visit Note   11/25/2023 updated note for 03/18/23 Name: Kathleen [name] MRN: [id number] DOB: September 07, 1960  Kathleen [name] is a 62 y.o. year old female who sees Luking, Kathleen Coup, MD for primary care. I spoke with  Kathleen [name] by phone today.  What matters to the patients health and wellness today?  Kathleen [name] again and "broke" her left foot on 03/10/23. Had emergency surgery - fell going to bathroom in middle of the night Has a bedside commode but has not been using it Voices understanding that using it could decrease her falls She was admitted to [facility] nursing facility in [place] on 03/14/23. She informs RN CM she left the nursing home-"because they were mean to me" She confirms she is presently in her bed  Kathleen [name] had knee surgery 2 days after she broke her foot They now both need assistance for mobility, food    Kathleen [name] provided permission for RN CM to complete a conference call with her to [employer]) concierge [phone] HTA staff Kathleen [name] to check & assist patient [phone] poppa pal custodian care just left snf on 03/16/23  Transportation services is not covered  Kathleen [name] on Thursday 03/21/23 want to cancel visit with Kathleen [name]  She states she may be able to get her father to take her to the 03/21/23 appointment She reports Kathleen [name] ordered home health services for her  She is aware and uses her over the counter (OTC) benefits   Goals Addressed             This Visit's Progress    fall prevention, pain management,nausea, constipation- [facility] care coordination services       Interventions Today    Flowsheet Row Most Recent Value  Chronic Disease   Chronic disease during today's visit Other  [fall, [name]]  General Interventions   General Interventions Discussed/Reviewed General Interventions Reviewed, [employer], Doctor Visits, Communication with  Doctor Visits Discussed/Reviewed Doctor Visits Reviewed  PCP/Specialist  Visits Compliance with follow-up visit  Communication with PCP/Specialists, RN  Education Interventions   Education Provided Provided Education  Provided Verbal Education On [employer] benefit, poppa pal benefit, uce of bedside commode]  Mental Health Interventions   Mental Health Discussed/Reviewed Mental Health Reviewed, Coping Strategies, Other  Teacher, English as a foreign language, husband had knee surgery]  Safety Interventions   Safety Discussed/Reviewed Safety Reviewed, Fall Risk, Home Safety  Home Safety Assistive Devices              SDOH assessments and interventions completed:  No     Care Coordination Interventions:  Yes, provided   Follow up plan: Follow up call scheduled for 03/25/23    Encounter Outcome:  Pt. Visit Completed   Ruby Dilone L. [name], RN, BSN, CCM [facility] Care Management Community Coordinator Office number [phone]

## 2023-03-18 NOTE — Telephone Encounter (Signed)
I called and advised pt of Dr. Kathi Der message

## 2023-03-18 NOTE — Telephone Encounter (Signed)
Patient called. Says the rehab made her put weight on her foot. So she walked out and left. She would like home health. Her call back # [phone]

## 2023-03-21 ENCOUNTER — Telehealth (INDEPENDENT_AMBULATORY_CARE_PROVIDER_SITE_OTHER): Payer: HMO | Admitting: Family Medicine

## 2023-03-21 ENCOUNTER — Ambulatory Visit (INDEPENDENT_AMBULATORY_CARE_PROVIDER_SITE_OTHER): Payer: HMO | Admitting: Orthopaedic Surgery

## 2023-03-21 ENCOUNTER — Other Ambulatory Visit (HOSPITAL_COMMUNITY): Payer: Self-pay

## 2023-03-21 ENCOUNTER — Encounter: Payer: Self-pay | Admitting: Orthopaedic Surgery

## 2023-03-21 DIAGNOSIS — S32009S Unspecified fracture of unspecified lumbar vertebra, sequela: Secondary | ICD-10-CM

## 2023-03-21 DIAGNOSIS — W19XXXD Unspecified fall, subsequent encounter: Secondary | ICD-10-CM | POA: Diagnosis not present

## 2023-03-21 DIAGNOSIS — S32009D Unspecified fracture of unspecified lumbar vertebra, subsequent encounter for fracture with routine healing: Secondary | ICD-10-CM

## 2023-03-21 DIAGNOSIS — Z79891 Long term (current) use of opiate analgesic: Secondary | ICD-10-CM

## 2023-03-21 DIAGNOSIS — S22000D Wedge compression fracture of unspecified thoracic vertebra, subsequent encounter for fracture with routine healing: Secondary | ICD-10-CM | POA: Diagnosis not present

## 2023-03-21 DIAGNOSIS — S22080G Wedge compression fracture of T11-T12 vertebra, subsequent encounter for fracture with delayed healing: Secondary | ICD-10-CM

## 2023-03-21 DIAGNOSIS — S82892D Other fracture of left lower leg, subsequent encounter for closed fracture with routine healing: Secondary | ICD-10-CM

## 2023-03-21 MED ORDER — OXYCONTIN 30 MG PO T12A
30.0000 mg | EXTENDED_RELEASE_TABLET | Freq: Two times a day (BID) | ORAL | 0 refills | Status: DC
  Filled 2023-03-21 – 2023-03-28 (×4): qty 28, 14d supply, fill #0

## 2023-03-21 MED ORDER — OXYCODONE-ACETAMINOPHEN 5-325 MG PO TABS
1.0000 | ORAL_TABLET | ORAL | 0 refills | Status: DC | PRN
Start: 2023-03-21 — End: 2023-03-29
  Filled 2023-03-21 – 2023-03-22 (×2): qty 40, 7d supply, fill #0

## 2023-03-21 MED ORDER — OXYCODONE-ACETAMINOPHEN 5-325 MG PO TABS: ORAL_TABLET | ORAL | 0 refills | Status: DC

## 2023-03-21 NOTE — Progress Notes (Signed)
Office Visit Note   Patient: Kathleen [name]           Date of Birth: Nov 15, 1960           MRN: [id number] Visit Date: 03/21/2023              Requested by: [name], MD [address] [place],  [place] [address] PCP: [name], MD   Assessment & Plan: Visit Diagnoses:  1. Closed fracture of transverse process of lumbar vertebra with routine healing, subsequent encounter   2. Closed fracture of left ankle with routine healing, subsequent encounter     Plan: No change in medications.  Significant discussion with patient concerning long-term opioid medication with chronic pain as well as knee pain.  I discussed with her unfortunately more narcotic medication will not take care of the problem.  We discussed elevation and intermittent ice.  Long discussion about depression and pain and their interaction.  She will follow-up with Dr. Christell Constant as scheduled.  Follow-Up Instructions: No follow-ups on file.   Orders:  No orders of the defined types were placed in this encounter.  No orders of the defined types were placed in this encounter.     Procedures: No procedures performed   Clinical Data: No additional findings.   Subjective: Chief Complaint  Patient presents with   Lower Back - Follow-up, Fracture    Fall 12/18/2022    HPI 62 year old female returns I seen her about L2 transverse process fracture after a fall.  She has had subsequent fall getting out of bed on 03/10/2023 suffering left ankle fracture treated surgically by Dr. Christell Constant with ORIF.  Patient is on OxyContin 40 mg every 12 and oxycodone 5 mg 1 every 4 hours.(Total of equivalent of Percocet 5 mg strength #20 tablets a day) patient's gutter splint on her ankle.  She has a follow-up with Dr. Christell Constant concerning her ankle.  Her major problem is been pain and she is wondering if some different medicines could be given to her to take care of the pain.  She is gradually increased her opioid daily total over  many months/years.  Review of Systems updated unchanged   Objective: Vital Signs: LMP 05/17/2016   Physical Exam Constitutional:      Appearance: She is well-developed.  HENT:     Head: Normocephalic.     Right Ear: External ear normal.     Left Ear: External ear normal. There is no impacted cerumen.  Eyes:     Pupils: Pupils are equal, round, and reactive to light.  Neck:     Thyroid: No thyromegaly.     Trachea: No tracheal deviation.  Cardiovascular:     Rate and Rhythm: Normal rate.  Pulmonary:     Effort: Pulmonary effort is normal.  Abdominal:     Palpations: Abdomen is soft.  Musculoskeletal:     Cervical back: No rigidity.  Skin:    General: Skin is warm and dry.  Neurological:     Mental Status: She is alert and oriented to person, place, and time.  Psychiatric:        Behavior: Behavior normal.     Ortho Exam Short leg splint present sensation lower extremities is intact.  Specialty Comments:  No specialty comments available.  Imaging: No results found.   PMFS History: Patient Active Problem List   Diagnosis Date Noted   Ankle fracture, bimalleolar, closed, left, sequela 03/10/2023   Opioid dependence (HCC) 03/10/2023  Closed fracture of left ankle 03/10/2023   Syndesmotic disruption of ankle, left, initial encounter 03/10/2023   Lumbar transverse process fracture (HCC) 01/02/2023   Acute on chronic hypoxic respiratory failure (HCC) 12/13/2022   Depression, major, single episode, moderate (HCC) 09/10/2022   Aortic atherosclerosis (HCC) 09/10/2022   Chronic respiratory failure with hypoxia (HCC) 05/22/2021   Anxiety and depression 05/22/2021   Vitamin D deficiency 05/09/2021   Left foot drop 01/09/2021   Pulmonary HTN (HCC) 10/12/2020   Encounter for long-term opiate analgesic use 02/02/2020   Interstitial lung disease (HCC) 04/14/2019   GERD (gastroesophageal reflux disease) 10/15/2017   Personal history of noncompliance with medical  treatment, presenting hazards to health 04/26/2017   Hypothyroidism 01/15/2017   Adrenal insufficiency (Addison's disease) (HCC) 08/22/2016   Symptomatic bradycardia    Hypokalemia 08/17/2016   Chronic pain syndrome 04/20/2016   Irritable bowel syndrome with constipation 11/14/2015   Avascular necrosis of bone of hip, left (HCC) 04/05/2015   Post herpetic neuralgia 10/22/2014   Mixed hyperlipidemia 08/25/2014   Obstructive sleep apnea 02/18/2014   Class 2 severe obesity due to excess calories with serious comorbidity and body mass index (BMI) of 38.0 to 38.9 in adult [facility] 05/06/2013   Fibromyalgia 02/11/2013   Hypotension 01/16/2013   Fibrocystic breast disease in female 04/09/2012   Sarcoidosis 06/21/2008   Past Medical History:  Diagnosis Date   Adrenal insufficiency (HCC)    Anemia    Anxiety    Avascular bone necrosis (HCC)    Breast fibrocystic disorder    Chronic pain    COPD (chronic obstructive pulmonary disease) (HCC)    Encounter for long-term opiate analgesic use 02/02/2020   Patient has chronic pain with femoral head avascular necrosis.  Is under pain contract   Fibromyalgia    Gastroesophageal reflux disease    Hypothyroidism    Mitral valve prolapse    Orthostatic hypotension    Peripheral neuropathy    PONV (postoperative nausea and vomiting)    Restrictive lung disease    Moderate to severe   Sarcoidosis    Biopsy proven - [facility]   SOB (shortness of breath)    chronic    Family History  Problem Relation Age of Onset   Cardiomyopathy Mother    Breast cancer Mother    Prostate cancer Father    Heart attack Maternal Grandmother    Heart attack Maternal Grandfather    Heart attack Paternal Grandmother    Bipolar disorder Daughter    Heart disease Maternal Aunt    Neurofibromatosis Cousin    Colon cancer Neg Hx     Past Surgical History:  Procedure Laterality Date   BIOPSY  10/17/2018   Procedure: BIOPSY;  Surgeon: Malissa Hippo, MD;  Location: [facility]  ENDO SUITE;  Service: Endoscopy;;  duodenum, antrum, gastric body   BIOPSY  03/03/2021   Procedure: BIOPSY;  Surgeon: Malissa Hippo, MD;  Location: [facility] ENDO SU[facility];  Service: Endoscopy;;   BREAST LUMPECTOMY Bilateral 01/12/2011   CHOLECYSTECTOMY N/A 04/17/2021   Procedure: LAPAROSCOPIC CHOLECYSTECTOMY;  Surgeon: Franky Macho, MD;  Location: [facility] ORS;  Service: General;  Laterality: N/A;   COLONOSCOPY WITH PROPOFOL N/A 01/03/2022   Procedure: COLONOSCOPY WITH PROPOFOL;  Surgeon: Malissa Hippo, MD;  Location: [facility] ENDO [facility];  Service: Endoscopy;  Laterality: N/A;  220   ESOPHAGEAL DILATION N/A 03/03/2021   Procedure: ESOPHAGEAL DILATION;  Surgeon: Malissa Hippo, MD;  Location: [facility];  Service: Endoscopy;  Laterality: N/A;  ESOPHAGOGASTRODUODENOSCOPY (EGD) WITH PROPOFOL N/A 10/17/2018   Procedure: ESOPHAGOGASTRODUODENOSCOPY (EGD) WITH PROPOFOL;  Surgeon: Malissa Hippo, MD;  Location: [facility];  Service: Endoscopy;  Laterality: N/A;  2:25   ESOPHAGOGASTRODUODENOSCOPY (EGD) WITH PROPOFOL N/A 03/03/2021   Procedure: ESOPHAGOGASTRODUODENOSCOPY (EGD) WITH PROPOFOL;  Surgeon: Malissa Hippo, MD;  Location: [facility];  Service: Endoscopy;  Laterality: N/A;  12:15   ORIF ANKLE FRACTURE Left 03/10/2023   Procedure: OPEN REDUCTION INTERNAL FIXATION (ORIF) ANKLE FRACTURE;  Surgeon: [name], MD;  Location: [facility] OR;  Service: Orthopedics;  Laterality: Left;   POLYPECTOMY  01/03/2022   Procedure: POLYPECTOMY INTESTINAL;  Surgeon: Malissa Hippo, MD;  Location: [facility];  Service: Endoscopy;;   TUBAL LIGATION     Social History   Occupational History   Occupation: Educational psychologist: [employer]  Tobacco Use   Smoking status: Never    Passive exposure: Never   Smokeless tobacco: Never  Vaping Use   Vaping status: Never Used  Substance and Sexual Activity   Alcohol use: No   Drug use: No   Sexual activity: Yes    Birth control/protection:  Post-menopausal

## 2023-03-21 NOTE — Progress Notes (Addendum)
Subjective:    Patient ID: Kathleen [name], female    DOB: 1961/03/30, 62 y.o.   MRN: [id number]  HPI I connected with  [name] on 03/21/23 by a video enabled telemedicine application and verified that I am speaking with the correct person using two identifiers.   I discussed the limitations of evaluation and management by telemedicine. The patient expressed understanding and agreed to proceed.  Patient relates that she is unable to utilize video at home She request to do this by phone only  Patient states she is at home. Patient states she needs refills on her medications. Please see previous notes Patient has a history of osteonecrosis of the femoral head She also has been on long-term oxycodone  It should be noted that she was on hydrocodone back in June 2014 but that did not do enough to help with her pain  Initially in 2016 she was started off on oxycodone 5 mg 4 times daily, then in 2017 July she was switched over to oxycodone 10 mg taking approximately 3 to 4/day, by 2020 she was maximum 5/day, by the end of 2021 she was at a maximum of 6/day and she has been on that dosing ever since  She is compliant with following up every 3 months.  Her urine drug screens have not shown any apparent see She does not request for pain medicine early  Patient was at home Provider was at the office 25 minutes spent Review of Systems     Objective:   Physical Exam Unable to do exam because of phone       Assessment & Plan:  Long discussion held regarding her pain She has a longstanding history of a osteonecrosis of the femoral head for which she has been on oxycodone.  Because of the severity of the pain she was on 10 mg oxycodone taken 5-6 times daily She was under a pain contract She did yearly urine drug screen She was compliant with her medicines She did not abuse the medicines or asked for early refills PDMP was checked on a regular basis Patient has seen numerous  orthopedist who stated they would not do the hip surgery because of her underlying lung condition and being on immunosuppressive She is followed by Dr. Dudley Major pulmonologist [facility]  She does have underlying anxiety related to her poor condition she is fearful of her health getting worse Her tolerance for pain is not great but this is partly because of the difficult nature of her illness over the past several years  Recently she fell and fractured a pedicle in her back she also had a recent fall that she fractured her ankle and foot and had to have surgery While they were working all of this up a MRI of thoracic spine showed a partial compression fracture  She is currently on pain medication because 10 mg 6 times daily was not helping her pain from these injuries-specifically the pedicle fracture she had increased it 7 tablets in a day and states that that was not helping her pain she was incapacitated by the pain.  We did gradually move her medications up to the current dosing which is 40 mg OxyContin taken twice daily with 5 mg for breakthrough pain no greater than 4 times per day  I have talked with her today how long-term it would be safer for her to be on a lower amount of medicine.  She is hesitant to do so today because the most  recent fracture but she does agreed to next week shifting from 40 mg OxyContin twice daily down to 30 mg OxyContin twice daily and maintain the 5 mg as breakthrough pain  She also understands that her current pain medication dosing exceeds guidelines that we follow with him primary care within [facility]  We are doing the best to help her out but we cannot maintain this long-term  Previously she went to [facility] to be seen and stated that there were several episodes when she was trying to be seen where the front desk either did not have her listed as an appointment or was able to get her in.  When the specialist talk about changing her medicine she was hesitant and  stated that she would come back to [place] for that management.  Upon notification of this I talked with the patient and clarified with her that we cannot do long-term prescriptions of this magnitude.  That it is necessary for her to be seen at pain management.  We also discussed how it will be up to pain management to decide her dosing and what is appropriate and she will need to follow their direction.  She stated that she would be open to seeing pain management in the right at the moment it is very difficult because she has foot fracture with limited mobility.  For now we will continue to prescribe but we are trying to get her in with the pain management center.  In addition to this we are doing a change in her medicine starting next Thursday.  She will stop OxyContin 40 mg twice daily and she will utilize 30 mg twice daily as stated above.  She will maintain the 5 mg for breakthrough pain no more than 4/day  She has Narcan at home she and her husband are aware of signs of accidental overdose and she states these medications are not causing drowsiness and help her pain to allow her to be more functional  We will do another follow-up visit in approximately 2 weeks time  We are hopeful to get her in with pain management because I believe she would benefit from being under the care of a pain management specialist both for control of her pain as well as long-term safety.  Also I believe that she would benefit from being on long-acting pain medicine and to get away from the short acting pain medicine.  We are doing the best we can with a difficult situation tapering down her medicine.  If pain management is not able to see her or do not feel they can take on her case we would have to continue to search for pain management willing to take on her situation and we would continue to work with the patient to try to help taper down medication as best as possible  Addendum April 16, 2023-it should be noted that  patient continues to have significant pain with her hip and her foot but we have transition her away from OxyContin and have resumed her previous regimen of oxycodone 10 mg / 325 mg 1 every 4 hours as needed pain.  Maximum dose is 6/day.  She has been on this dose previously.  I told the patient that escalation of her pain medicine beyond this level is beyond our protocols.  I have also encouraged her to be open to working with a pain management clinic to finding a different regimen to help take the edge off her pain but that nothing will  totally remove her pain.  Patient is open to having pain management assist.

## 2023-03-22 ENCOUNTER — Other Ambulatory Visit (HOSPITAL_COMMUNITY): Payer: Self-pay

## 2023-03-23 ENCOUNTER — Telehealth: Payer: Self-pay | Admitting: Family Medicine

## 2023-03-23 NOTE — Telephone Encounter (Signed)
Nurses Please send office visit notes as well as urine drug screen within the past 12 months including her most recent visit a couple days ago which was a telephone visit. We will send this to [facility] pain management Fax number [phone] Attention Fleet Contras  Please notify me if any problems thanks or concerns

## 2023-03-25 ENCOUNTER — Ambulatory Visit: Payer: Self-pay | Admitting: *Deleted

## 2023-03-25 NOTE — Patient Instructions (Signed)
Visit Information  Thank you for taking time to visit with me today. Please don't hesitate to contact me if I can be of assistance to you.   Following are the goals we discussed today:   Goals Addressed             This Visit's Progress    [facility] care coordination services (pain management, bathes, home health aide)       Interventions Today    Flowsheet Row Most Recent Value  Chronic Disease   Chronic disease during today's visit --  [Chronic pain, fracture, pending appointment]  General Interventions   General Interventions Discussed/Reviewed General Interventions Reviewed, Doctor Visits  Doctor Visits Discussed/Reviewed Doctor Visits Reviewed, Specialist  PCP/Specialist Visits Compliance with follow-up visit  Exercise Interventions   Physical Activity Discussed/Reviewed Physical Activity Reviewed  [remains bed bound]  Pharmacy Interventions   Pharmacy Dicussed/Reviewed Pharmacy Topics Reviewed, Affording Medications  [confirms she has pain medications as needed]              Our next appointment is by telephone on 04/01/23 at 2 pm  Please call the care guide team at [phone] if you need to cancel or reschedule your appointment.   If you are experiencing a Mental Health or Behavioral Health Crisis or need someone to talk to, please call the Suicide and Crisis Lifeline: 988 call the [place] National Suicide Prevention Lifeline: 303-532-3609 or TTY: 458 376 2332 TTY (236)688-2177) to talk to a trained counselor call 1-800-273-TALK (toll free, 24 hour hotline) call the [facility]: 6608264318 call 911   Patient verbalizes understanding of instructions and care plan provided today and agrees to view in MyChart. Active MyChart status and patient understanding of how to access instructions and care plan via MyChart confirmed with patient.     The patient has been provided with contact information for the care management team and has been advised to call  with any health related questions or concerns.   Lowery Paullin L. [name], RN, BSN, CCM [facility] Care Management Community Coordinator Office number [phone]

## 2023-03-25 NOTE — Patient Outreach (Signed)
  Care Coordination   Follow Up Visit Note   03/25/2023 Name: YEJIN FAUSNAUGH MRN: [id number] DOB: [birth year]  [name] is a 62 y.o. year old female who sees Luking, Jonna Coup, MD for primary care. I spoke with  [name] by phone today.  What matters to the patients health and wellness today?  Patient returned RN CM's call She reports she is still bed bound until she can see her specialist on 03/29/23 for follow up. After this, she may be able for home health PT to work on her. Her spouse will take her to the appointment.  She denies social determinants of health (SDOH) concerns + has palin medication She voices appreciation for the follow up outreach     Goals Addressed             This Visit's Progress    [facility] care coordination services (pain management, bathes, home health aide)       Interventions Today    Flowsheet Row Most Recent Value  Chronic Disease   Chronic disease during today's visit --  [Chronic pain, fracture, pending appointment]  General Interventions   General Interventions Discussed/Reviewed General Interventions Reviewed, Doctor Visits  Doctor Visits Discussed/Reviewed Doctor Visits Reviewed, Specialist  PCP/Specialist Visits Compliance with follow-up visit  Exercise Interventions   Physical Activity Discussed/Reviewed Physical Activity Reviewed  [remains bed bound]  Pharmacy Interventions   Pharmacy Dicussed/Reviewed Pharmacy Topics Reviewed, Affording Medications  [confirms she has pain medications as needed]              SDOH assessments and interventions completed:  No     Care Coordination Interventions:  Yes, provided   Follow up plan: Follow up call scheduled for 04/01/23    Encounter Outcome:  Pt. Visit Completed   Husna Krone L. [name], RN, BSN, CCM [facility] Care Management Community Coordinator Office number [phone]

## 2023-03-25 NOTE — Patient Outreach (Signed)
  Care Coordination   03/25/2023 Name: Kathleen [name] MRN: [id number] DOB: May 06, 1961   Care Coordination Outreach Attempts:  An unsuccessful telephone outreach was attempted for a scheduled appointment today.  Follow Up Plan:  Additional outreach attempts will be made to offer the patient care coordination information and services.   Encounter Outcome:  No Answer   Care Coordination Interventions:  No, not indicated    Yurem Viner L. [name], RN, BSN, CCM [facility] Care Management Community Coordinator Office number [phone]

## 2023-03-28 ENCOUNTER — Other Ambulatory Visit (HOSPITAL_COMMUNITY): Payer: Self-pay

## 2023-03-28 ENCOUNTER — Telehealth: Payer: Self-pay

## 2023-03-28 ENCOUNTER — Other Ambulatory Visit: Payer: Self-pay | Admitting: Family Medicine

## 2023-03-28 ENCOUNTER — Other Ambulatory Visit: Payer: Self-pay | Admitting: "Endocrinology

## 2023-03-28 MED ORDER — HYDROCORTISONE 10 MG PO TABS
ORAL_TABLET | ORAL | 3 refills | Status: DC
  Filled 2023-03-28: qty 120, 30d supply, fill #0
  Filled 2023-04-27: qty 120, 30d supply, fill #1
  Filled 2023-05-22: qty 120, 30d supply, fill #2
  Filled 2023-06-17: qty 120, 30d supply, fill #3

## 2023-03-28 NOTE — Telephone Encounter (Signed)
I have spoke to Kathleen [name] about her oxyCODONE (OXYCONTIN) 30 MG 12 hr tablet  her insurance is not paying for it. I have explained there is not pro authorization sent over and that it was in the RX the way it was wrote that the medication was dropped to 30 mg. She feels this is not being communicated correctly and wants a call back.   Kathleen [name] [phone]

## 2023-03-29 ENCOUNTER — Ambulatory Visit: Payer: Self-pay

## 2023-03-29 ENCOUNTER — Other Ambulatory Visit: Payer: Self-pay | Admitting: Family Medicine

## 2023-03-29 ENCOUNTER — Other Ambulatory Visit (HOSPITAL_COMMUNITY): Payer: Self-pay

## 2023-03-29 ENCOUNTER — Ambulatory Visit (INDEPENDENT_AMBULATORY_CARE_PROVIDER_SITE_OTHER): Payer: HMO | Admitting: Orthopedic Surgery

## 2023-03-29 ENCOUNTER — Telehealth: Payer: Self-pay | Admitting: Orthopedic Surgery

## 2023-03-29 ENCOUNTER — Ambulatory Visit: Payer: HMO | Admitting: Orthopedic Surgery

## 2023-03-29 DIAGNOSIS — S82892D Other fracture of left lower leg, subsequent encounter for closed fracture with routine healing: Secondary | ICD-10-CM | POA: Diagnosis not present

## 2023-03-29 MED ORDER — OXYCODONE-ACETAMINOPHEN 10-325 MG PO TABS
1.0000 | ORAL_TABLET | ORAL | 0 refills | Status: DC | PRN
Start: 2023-03-29 — End: 2023-04-05
  Filled 2023-03-29: qty 42, 7d supply, fill #0

## 2023-03-29 MED ORDER — OXYCODONE-ACETAMINOPHEN 10-325 MG PO TABS: ORAL_TABLET | ORAL | 0 refills | Status: DC

## 2023-03-29 NOTE — Progress Notes (Signed)
Orthopedic Surgery Post-operative Office Visit  Procedure: Left ankle fracture ORIF Date of Surgery: 03/10/2023 (~2.5 weeks post-op)  Assessment: Patient is a 62 y.o. who has had interval displacement of her fractures since surgery   Plan: -Operative plans complete -I made it very clear that she is not to put any weight on her left leg. I explained the importance of nonweightbearing and that the screws are not designed to allow for weightbearing.  I told her that if she continues to weight-bear the fractures may displace further, her ankle may dislocate, and she may need further surgical intervention.  Given her noncompliance, I plan to keep her nonweightbearing for 3 months to her surgical fixation and keep her from doing more on the ankle than I think she should -Removed sutures, placed her in a short leg cast -Return to office in 4 weeks, x-rays needed at next visit: [facility]/lateral/mortise left ankle in cast  ___________________________________________________________________________   Subjective: Patient's ankle pain has gotten better but is still present.  She is on chronic narcotics.  She says that she has been putting weight on the leg.  She said at the nursing facility they told her that it was okay to put weight on it and that she needed to start get herself to the bathroom.  She continues to put weight on it to transfer.  Otherwise, she is mostly getting around in a scooter.  Has not had any fevers or chills.  Has not noticed any drainage coming out of the splint.  Objective:  General: no acute distress, appropriate affect Neurologic: alert, answering questions appropriately, following commands Respiratory: unlabored breathing on room air Skin: Incisions over the medial and lateral aspect of the ankle are well-approximated with nylon sutures in place, no active or expressible drainage, no erythema surrounding the wounds  MSK (LLE): Dorsiflexion to neutral, plantarflexion to 20  degrees, dorsiflexes and plantar flexes toes, sensation intact to light touch in sural/saphenous/deep peroneal/superficial peroneal/tibial nerve distributions, foot warm and well-perfused   Imaging: X-rays of the left ankle taken 03/29/2023 were independently reviewed and interpreted, showing minimal interval displacement of her medial malleolus and lateral malleolus fractures with slight lateral translation of both.  The talus appears to be slightly translated laterally when compared to her immediate postoperative films.  No lucency is seen around the screws.  Screws have not backed out.  No new fractures seen.    Patient name: Kathleen [name] Patient MRN: [id number] Date of visit: 03/29/23

## 2023-03-29 NOTE — Telephone Encounter (Signed)
Nurses-please inform patient that I did send in oxycodone 1 every 4 hours as needed pain 10 mg / 325 mg she can take a maximum of 6/day she is aware that I was sending this sent Let her know that I could send in a 7-day supply and she will need to give [place] feedback later next week on how that is doing then we can send in additional medicine next Friday Should she have any problems or setbacks to let us know  (It should be noted that we tried to get her OxyContin covered but insurance would not cover this.  After further discussion with the patient I felt it was best for her to go back toward what she was on before which was Percocet 10/325 1 every 4 hours as needed pain maximum 6/day she seemed to do okay with this in the past.  I have encouraged her to if she can get by on 4 or 5 pills/day to do so but maximum 6/day she understood she also understood to not take any other form of oxycodone with this.  She is completely out of her OxyContin and only has a few of the 5 mg.  She states that she would turn the 5 mg into the local drug box at the pharmacy)

## 2023-03-29 NOTE — Telephone Encounter (Signed)
Patient notified and verbalized understanding. 

## 2023-03-29 NOTE — Telephone Encounter (Signed)
Order faxed to [phone]

## 2023-03-29 NOTE — Telephone Encounter (Signed)
Patient called asked if she can get a regular wheelchair. Patient asked if the Rx can be faxed to [facility]. The number to contact patient is [phone]

## 2023-03-30 NOTE — Progress Notes (Signed)
Patient marked as no show but did show up and separate note was written

## 2023-04-01 ENCOUNTER — Ambulatory Visit: Payer: Self-pay | Admitting: *Deleted

## 2023-04-01 ENCOUNTER — Telehealth: Payer: Self-pay | Admitting: Orthopedic Surgery

## 2023-04-01 NOTE — Telephone Encounter (Signed)
Kim from [facility] called. Would like an order for OT for ADL'S for bath aid to be done. She is non WB and needs a bath. Kim's [phone]

## 2023-04-01 NOTE — Patient Outreach (Signed)
  Care Coordination   Follow Up Visit Note   04/01/2023 Name: Kathleen [name] MRN: [id number] DOB: [birth year]  Kathleen [name] is a 62 y.o. year old female who sees Luking, Jonna Coup, MD for primary care. I spoke with  Kathleen [name] by phone today.  What matters to the patients health and wellness today?  Nausea today -has been taking home as needed medications with some relief She confirms she was able to attend her 03/29/23 orthopedic office visit She still remains non weight bearing, therefore still pending home health PT services  Activities of daily living (ADLs)- She reports not having a bath lately and interest in getting one     Goals Addressed             This Visit's Progress    [facility] care coordination services (pain management, bathes, home health aide)   On track    Interventions Today    Flowsheet Row Most Recent Value  Chronic Disease   Chronic disease during today's visit Other  [nausea, bath]  General Interventions   General Interventions Discussed/Reviewed General Interventions Reviewed, Doctor Visits, [facility], Communication with  Doctor Visits Discussed/Reviewed Doctor Visits Reviewed, Specialist  PCP/Specialist Visits Compliance with follow-up visit  Communication with --  Nicholes Stairs to Dr Maudry Mayhew office to request [facility]  bath aide]  Education Interventions   Education Provided Provided Education  Provided Verbal Education On [facility]  Mental Health Interventions   Mental Health Discussed/Reviewed Mental Health Reviewed, Coping Strategies  Pharmacy Interventions   Pharmacy Dicussed/Reviewed Pharmacy Topics Reviewed, Medications and their functions              SDOH assessments and interventions completed:  No     Care Coordination Interventions:  Yes, provided   Follow up plan: Follow up call scheduled for 04/25/23    Encounter Outcome:  Pt. Visit Completed    L. [name], RN, BSN, CCM [facility] Care Management Community  Coordinator Office number [phone]

## 2023-04-01 NOTE — Patient Instructions (Addendum)
Visit Information  Thank you for taking time to visit with me today. Please don't hesitate to contact me if I can be of assistance to you.   Following are the goals we discussed today:   Goals Addressed             This Visit's Progress    [facility] care coordination services (pain management, bathes, home health aide)   On track    Interventions Today    Flowsheet Row Most Recent Value  Chronic Disease   Chronic disease during today's visit Other  [nausea, bath]  General Interventions   General Interventions Discussed/Reviewed General Interventions Reviewed, Doctor Visits, [facility], Communication with  Doctor Visits Discussed/Reviewed Doctor Visits Reviewed, Specialist  PCP/Specialist Visits Compliance with follow-up visit  Communication with --  Nicholes Stairs to Dr Maudry Mayhew office to request [facility]  bath aide]  Education Interventions   Education Provided Provided Education  Provided Verbal Education On Community Resources  Mental Health Interventions   Mental Health Discussed/Reviewed Mental Health Reviewed, Coping Strategies  Pharmacy Interventions   Pharmacy Dicussed/Reviewed Pharmacy Topics Reviewed, Medications and their functions              Our next appointment is by telephone on 04/25/23 at 1130  Please call the care guide team at [phone] if you need to cancel or reschedule your appointment.   If you are experiencing a Mental Health or Behavioral Health Crisis or need someone to talk to, please call the Suicide and Crisis Lifeline: 988 call the [place] National Suicide Prevention Lifeline: (561)674-1045 or TTY: (670) 305-3457 TTY 308-765-1054) to talk to a trained counselor call 1-800-273-TALK (toll free, 24 hour hotline) call the [facility]: 318-171-9164 call 911   Patient verbalizes understanding of instructions and care plan provided today and agrees to view in MyChart. Active MyChart status and patient understanding of how to access  instructions and care plan via MyChart confirmed with patient.     The patient has been provided with contact information for the care management team and has been advised to call with any health related questions or concerns.    L. [name], RN, BSN, CCM [facility] Care Management Community Coordinator Office number [phone]

## 2023-04-02 ENCOUNTER — Other Ambulatory Visit: Payer: Self-pay

## 2023-04-02 ENCOUNTER — Telehealth: Payer: Self-pay | Admitting: Family Medicine

## 2023-04-02 ENCOUNTER — Emergency Department (HOSPITAL_COMMUNITY)
Admission: EM | Admit: 2023-04-02 | Discharge: 2023-04-02 | Disposition: A | Discharge status: Home / Self Care | Payer: HMO | Attending: Emergency Medicine | Admitting: Emergency Medicine

## 2023-04-02 ENCOUNTER — Emergency Department (HOSPITAL_COMMUNITY): Payer: HMO

## 2023-04-02 DIAGNOSIS — J439 Emphysema, unspecified: Secondary | ICD-10-CM | POA: Diagnosis not present

## 2023-04-02 DIAGNOSIS — R1111 Vomiting without nausea: Secondary | ICD-10-CM | POA: Diagnosis not present

## 2023-04-02 DIAGNOSIS — I10 Essential (primary) hypertension: Secondary | ICD-10-CM | POA: Diagnosis not present

## 2023-04-02 DIAGNOSIS — E039 Hypothyroidism, unspecified: Secondary | ICD-10-CM | POA: Diagnosis not present

## 2023-04-02 DIAGNOSIS — J449 Chronic obstructive pulmonary disease, unspecified: Secondary | ICD-10-CM | POA: Insufficient documentation

## 2023-04-02 DIAGNOSIS — S32502D Unspecified fracture of left pubis, subsequent encounter for fracture with routine healing: Secondary | ICD-10-CM | POA: Diagnosis not present

## 2023-04-02 DIAGNOSIS — R0689 Other abnormalities of breathing: Secondary | ICD-10-CM | POA: Diagnosis not present

## 2023-04-02 DIAGNOSIS — R0602 Shortness of breath: Secondary | ICD-10-CM | POA: Diagnosis not present

## 2023-04-02 DIAGNOSIS — R110 Nausea: Secondary | ICD-10-CM | POA: Diagnosis not present

## 2023-04-02 DIAGNOSIS — R079 Chest pain, unspecified: Secondary | ICD-10-CM | POA: Insufficient documentation

## 2023-04-02 DIAGNOSIS — R112 Nausea with vomiting, unspecified: Secondary | ICD-10-CM | POA: Diagnosis not present

## 2023-04-02 DIAGNOSIS — J8410 Pulmonary fibrosis, unspecified: Secondary | ICD-10-CM | POA: Diagnosis not present

## 2023-04-02 DIAGNOSIS — D869 Sarcoidosis, unspecified: Secondary | ICD-10-CM | POA: Diagnosis not present

## 2023-04-02 DIAGNOSIS — R0789 Other chest pain: Secondary | ICD-10-CM | POA: Diagnosis not present

## 2023-04-02 LAB — TROPONIN I (HIGH SENSITIVITY)
Troponin I (High Sensitivity): 3 ng/L (ref <18)
Troponin I (High Sensitivity): 8 ng/L (ref <18)

## 2023-04-02 LAB — COMPREHENSIVE METABOLIC PANEL
ALT: 16 U/L (ref 0–44)
AST: 17 U/L (ref 15–41)
Albumin: 3.4 g/dL — ABNORMAL LOW (ref 3.5–5.0)
Alkaline Phosphatase: 115 U/L (ref 38–126)
Anion gap: 8 (ref 5–15)
BUN: 7 mg/dL (ref 6–20)
CO2: 29 mmol/L (ref 22–32)
Calcium: 8.4 mg/dL — ABNORMAL LOW (ref 8.9–10.3)
Chloride: 102 mmol/L (ref 98–111)
Creatinine, Ser: 0.87 mg/dL (ref 0.44–1.00)
GFR, Estimated: >60 mL/min (ref >60)
Glucose, Bld: 141 mg/dL — ABNORMAL HIGH (ref 70–99)
Potassium: 3.4 mmol/L — ABNORMAL LOW (ref 3.5–5.1)
Sodium: 139 mmol/L (ref 135–145)
Total Bilirubin: 0.2 mg/dL — ABNORMAL LOW (ref 0.3–1.2)
Total Protein: 6.4 g/dL — ABNORMAL LOW (ref 6.5–8.1)

## 2023-04-02 LAB — CBC
HCT: 34.9 % — ABNORMAL LOW (ref 36.0–46.0)
Hemoglobin: 10.4 g/dL — ABNORMAL LOW (ref 12.0–15.0)
MCH: 28 pg (ref 26.0–34.0)
MCHC: 29.8 g/dL — ABNORMAL LOW (ref 30.0–36.0)
MCV: 94.1 fL (ref 80.0–100.0)
Platelets: 424 10*3/uL — ABNORMAL HIGH (ref 150–400)
RBC: 3.71 MIL/uL — ABNORMAL LOW (ref 3.87–5.11)
RDW: 14.9 % (ref 11.5–15.5)
WBC: 11.2 10*3/uL — ABNORMAL HIGH (ref 4.0–10.5)
nRBC: 0 % (ref 0.0–0.2)

## 2023-04-02 MED ORDER — IOHEXOL 350 MG/ML SOLN
100.0000 mL | Freq: Once | INTRAVENOUS | Status: AC | PRN
  Administered 2023-04-02: 100 mL via INTRAVENOUS

## 2023-04-02 MED ORDER — DIPHENHYDRAMINE HCL 25 MG PO CAPS: 50.0000 mg | ORAL_CAPSULE | Freq: Once | ORAL | Status: AC

## 2023-04-02 MED ORDER — HYDROMORPHONE HCL 1 MG/ML IJ SOLN: 1.0000 mg | Freq: Once | INTRAMUSCULAR | Status: DC

## 2023-04-02 MED ORDER — FENTANYL CITRATE PF 50 MCG/ML IJ SOSY
50.0000 ug | PREFILLED_SYRINGE | Freq: Once | INTRAMUSCULAR | Status: AC
  Administered 2023-04-02: 50 ug via INTRAVENOUS
  Filled 2023-04-02: qty 1

## 2023-04-02 MED ORDER — METHYLPREDNISOLONE SODIUM SUCC 40 MG IJ SOLR
40.0000 mg | Freq: Once | INTRAMUSCULAR | Status: AC
  Administered 2023-04-02: 40 mg via INTRAVENOUS
  Filled 2023-04-02: qty 1

## 2023-04-02 MED ORDER — LIDOCAINE 5 % EX PTCH: 1.0000 | MEDICATED_PATCH | CUTANEOUS | 0 refills | Status: DC

## 2023-04-02 MED ORDER — LIDOCAINE 5 % EX PTCH
1.0000 | MEDICATED_PATCH | CUTANEOUS | Status: DC
  Administered 2023-04-02: 1 via TRANSDERMAL
  Filled 2023-04-02: qty 1

## 2023-04-02 MED ORDER — METHYLPREDNISOLONE SODIUM SUCC 125 MG IJ SOLR
80.0000 mg | Freq: Once | INTRAMUSCULAR | Status: AC
  Administered 2023-04-02: 80 mg via INTRAVENOUS
  Filled 2023-04-02: qty 2

## 2023-04-02 MED ORDER — DIPHENHYDRAMINE HCL 50 MG/ML IJ SOLN
50.0000 mg | Freq: Once | INTRAMUSCULAR | Status: AC
  Administered 2023-04-02: 50 mg via INTRAVENOUS
  Filled 2023-04-02: qty 1

## 2023-04-02 NOTE — ED Triage Notes (Signed)
Pt bib [facility] from home c/o SOB, chest pain, and N/V that started yesterday. Hx of Pulmonary Fibrosis and Sarcoidosis and is on O2 @ 4L at home.

## 2023-04-02 NOTE — ED Provider Notes (Signed)
[facility]-EMERGENCY DEPT [facility] Emergency Department Provider Note MRN:  [id number]  Arrival date & time: 04/02/23     Chief Complaint   Shortness of Breath   History of Present Illness   Kathleen [name] is a 62 y.o. year-old female with a history of COPD, sarcoidosis presenting to the ED with chief complaint of shortness of breath.  Chest pain, shortness of breath, nausea vomiting, abdominal discomfort this evening.  Review of Systems  A thorough review of systems was obtained and all systems are negative except as noted in the HPI and PMH.   Patient's Health History    Past Medical History:  Diagnosis Date   Adrenal insufficiency (HCC)    Anemia    Anxiety    Avascular bone necrosis (HCC)    Breast fibrocystic disorder    Chronic pain    COPD (chronic obstructive pulmonary disease) (HCC)    Encounter for long-term opiate analgesic use 02/02/2020   Patient has chronic pain with femoral head avascular necrosis.  Is under pain contract   Fibromyalgia    Gastroesophageal reflux disease    Hypothyroidism    Mitral valve prolapse    Orthostatic hypotension    Peripheral neuropathy    PONV (postoperative nausea and vomiting)    Restrictive lung disease    Moderate to severe   Sarcoidosis    Biopsy proven - [facility]   SOB (shortness of breath)    chronic    Past Surgical History:  Procedure Laterality Date   BIOPSY  10/17/2018   Procedure: BIOPSY;  Surgeon: Malissa Hippo, MD;  Location: [facility] END[facility] SU[facility];  Service: Endoscopy;;  duodenum, antrum, gastric body   BIOPSY  03/03/2021   Procedure: BIOPSY;  Surgeon: Malissa Hippo, MD;  Location: [facility];  Service: Endoscopy;;   BREAST LUMPECTOMY Bilateral 01/12/2011   CHOLECYSTECTOMY N/A 04/17/2021   Procedure: LAPAROSCOPIC CHOLECYSTECTOMY;  Surgeon: Franky Macho, MD;  Location: [facility] ORS;  Service: General;  Laterality: N/A;   COLONOSCOPY WITH PROPOFOL N/A 01/03/2022   Procedure: COLONOSCOPY WITH PROPOFOL;  Surgeon:  Malissa Hippo, MD;  Location: [facility];  Service: Endoscopy;  Laterality: N/A;  220   ESOPHAGEAL DILATION N/A 03/03/2021   Procedure: ESOPHAGEAL DILATION;  Surgeon: Malissa Hippo, MD;  Location: [facility];  Service: Endoscopy;  Laterality: N/A;   ESOPHAGOGASTRODUODENOSCOPY (EGD) WITH PROPOFOL N/A 10/17/2018   Procedure: ESOPHAGOGASTRODUODENOSCOPY (EGD) WITH PROPOFOL;  Surgeon: Malissa Hippo, MD;  Location: [facility];  Service: Endoscopy;  Laterality: N/A;  2:25   ESOPHAGOGASTRODUODENOSCOPY (EGD) WITH PROPOFOL N/A 03/03/2021   Procedure: ESOPHAGOGASTRODUODENOSCOPY (EGD) WITH PROPOFOL;  Surgeon: Malissa Hippo, MD;  Location[facility];  Service: Endoscopy;  Laterality: N/A;  12:15   ORIF ANKLE FRACTURE Left 03/10/2023   Procedure: OPEN REDUCTION INTERNAL FIXATION (ORIF) ANKLE FRACTURE;  Surgeon: [name], MD;  Location: [facility] OR;  Service: Orthopedics;  Laterality: Left;   POLYPECTOMY  01/03/2022   Procedure: POLYPECTOMY INTESTINAL;  Surgeon: Malissa Hippo, MD;  Location: [facility];  Service: Endoscopy;;   TUBAL LIGATION      Family History  Problem Relation Age of Onset   Cardiomyopathy Mother    Breast cancer Mother    Prostate cancer Father    Heart attack Maternal Grandmother    Heart attack Maternal Grandfather    Heart attack Paternal Grandmother    Bipolar disorder Daughter    Heart disease Maternal Aunt    Neurofibromatosis Cousin  Colon cancer Neg Hx     Social History   Socioeconomic History   Marital status: Married    Spouse name: Not on file   Number of children: 1   Years of education: Not on file   Highest education level: Not on file  Occupational History   Occupation: Midwife Payable    Employer: [employer]  Tobacco Use   Smoking status: Never    Passive exposure: Never   Smokeless tobacco: Never  Vaping Use   Vaping status: Never Used  Substance and Sexual Activity   Alcohol use: No   Drug use: No   Sexual  activity: Yes    Birth control/protection: Post-menopausal  Other Topics Concern   Not on file  Social History Narrative   Daily caffeine    Living with husband   Right handed   She has been on disability since 2014.    Social Determinants of Health   Financial Resource Strain: Low Risk  (09/21/2022)   Overall Financial Resource Strain (CARDIA)    Difficulty of Paying Living Expenses: Not hard at all  Food Insecurity: No Food Insecurity (03/10/2023)   Hunger Vital Sign    Worried About Running Out of Food in the Last Year: Never true    Ran Out of Food in the Last Year: Never true  Transportation Needs: No Transportation Needs (03/10/2023)   PRAPARE - Administrator, Civil Service (Medical): No    Lack of Transportation (Non-Medical): No  Physical Activity: Inactive (09/21/2022)   Exercise Vital Sign    Days of Exercise per Week: 0 days    Minutes of Exercise per Session: 0 min  Stress: No Stress Concern Present (01/14/2023)   [employer] of Occupational Health - Occupational Stress Questionnaire    Feeling of Stress : Only a little  Social Connections: Socially Integrated (09/21/2022)   Social Connection and Isolation Panel [NHANES]    Frequency of Communication with Friends and Family: More than three times a week    Frequency of Social Gatherings with Friends and Family: Three times a week    Attends Religious Services: 1 to 4 times per year    Active Member of Clubs or Organizations: No    Attends Banker Meetings: 1 to 4 times per year    Marital Status: Married  Catering manager Violence: Not At Risk (03/10/2023)   Humiliation, Afraid, Rape, and Kick questionnaire    Fear of Current or Ex-Partner: No    Emotionally Abused: No    Physically Abused: No    Sexually Abused: No     Physical Exam   Vitals:   04/02/23 0316 04/02/23 0318  BP:  133/86  Pulse:  93  Resp:  (!) 24  Temp: 97.8 F (36.6 C)   SpO2:  99%    CONSTITUTIONAL:  Chronically ill-appearing, NAD NEURO/PSYCH:  Alert and oriented x 3, no focal deficits EYES:  eyes equal and reactive ENT/NECK:  no LAD, no JVD CARDIO: Regular rate, well-perfused, normal S1 and S2 PULM:  CTAB no wheezing or rhonchi GI/GU:  non-distended, non-tender MSK/SPINE:  No gross deformities, no edema SKIN:  no rash, atraumatic   *Additional and/or pertinent findings included in MDM below  Diagnostic and Interventional Summary    EKG Interpretation Date/Time:  Tuesday April 02 2023 03:21:11 EDT Ventricular Rate:  90 PR Interval:  132 QRS Duration:  86 QT Interval:  464 QTC Calculation: 568 R Axis:   80  Text Interpretation:  Sinus rhythm Low voltage, precordial leads Abnormal R-wave progression, early transition Nonspecific T abnormalities, diffuse leads Prolonged QT interval No significant change was found Confirmed by Kennis Carina [phone]) on 04/02/2023 5:37:04 AM       Labs Reviewed  CBC - Abnormal; Notable for the following components:      Result Value   WBC 11.2 (*)    RBC 3.71 (*)    Hemoglobin 10.4 (*)    HCT 34.9 (*)    MCHC 29.8 (*)    Platelets 424 (*)    All other components within normal limits  COMPREHENSIVE METABOLIC PANEL - Abnormal; Notable for the following components:   Potassium 3.4 (*)    Glucose, Bld 141 (*)    Calcium 8.4 (*)    Total Protein 6.4 (*)    Albumin 3.4 (*)    Total Bilirubin 0.2 (*)    All other components within normal limits  TROPONIN I (HIGH SENSITIVITY)  TROPONIN I (HIGH SENSITIVITY)    DG Chest Port 1 View  Final Result    CT ABDOMEN PELVIS W CONTRAST    (Results Pending)  CT Angio Chest Pulmonary Embolism (PE) W or WO Contrast    (Results Pending)    Medications  diphenhydrAMINE (BENADRYL) capsule 50 mg (has no administration in time range)    Or  diphenhydrAMINE (BENADRYL) injection 50 mg (has no administration in time range)  fentaNYL (SUBLIMAZE) injection 50 mcg (has no administration in time range)   methylPREDNISolone sodium succinate (SOLU-MEDROL) 40 mg/mL injection 40 mg (40 mg Intravenous Given 04/02/23 0403)  fentaNYL (SUBLIMAZE) injection 50 mcg (50 mcg Intravenous Given 04/02/23 0439)     Procedures  /  Critical Care Procedures  ED Course and Medical Decision Making  Initial Impression and Ddx History of COPD, sarcoidosis chronically on 4 L.  Worsened shortness of breath no chest pain, Donnell pain, nausea vomiting this evening.  Broad differential.  Patient had an orthopedic surgery on the ankle last month.  PE is a concern as is ACS.  Past medical/surgical history that increases complexity of ED encounter: Sarcoidosis, COPD  Interpretation of Diagnostics I personally reviewed the EKG and my interpretation is as follows: Sinus rhythm, T wave abnormalities similar to prior  Labs overall reassuring with no significant blood count or electrolyte disturbance.  Troponin negative.  Patient Reassessment and Ultimate Disposition/Management     Patient has a contrast allergy awaiting CT imaging to exclude PE, significant for abdominal pathology.  If reassuring workup patient may be appropriate for discharge.  Signed out to oncoming provider at shift change.  Patient management required discussion with the following services or consulting groups:  None  Complexity of Problems Addressed Acute illness or injury that poses threat of life of bodily function  Additional Data Reviewed and Analyzed Further history obtained from: None  Additional Factors Impacting ED Encounter Risk Use of parenteral controlled substances  Elmer Sow. Pilar Plate, MD [facility] Emergency [facility] [email or web address]  Final Clinical Impressions(s) / ED Diagnoses     ICD-10-CM   1. SOB (shortness of breath)  R06.02     2. Chest pain, unspecified type  R07.9     3. Vomiting without nausea, unspecified vomiting type  R11.11       ED Discharge Orders     None         Discharge Instructions Discussed with and Provided to Patient:   Discharge Instructions   None      Kennis Carina  M, MD 04/02/23 [phone]

## 2023-04-02 NOTE — ED Notes (Signed)
Received report from Annalee Genta. Pt resting. Aware will have ct scan around 8am

## 2023-04-02 NOTE — ED Provider Notes (Signed)
  Physical Exam  BP (!) 129/59 (BP Location: Left Arm)   Pulse 82   Temp 97.9 F (36.6 C) (Oral)   Resp 17   Wt 109 kg   LMP 05/17/2016   SpO2 96%   BMI 36.55 kg/m   Physical Exam Vitals and nursing note reviewed.  Constitutional:      General: She is not in acute distress.    Appearance: She is well-developed.  HENT:     Head: Normocephalic and atraumatic.  Eyes:     Conjunctiva/sclera: Conjunctivae normal.  Cardiovascular:     Rate and Rhythm: Normal rate and regular rhythm.     Heart sounds: No murmur heard. Pulmonary:     Effort: Pulmonary effort is normal. No respiratory distress.     Breath sounds: Normal breath sounds.  Abdominal:     Palpations: Abdomen is soft.     Tenderness: There is no abdominal tenderness.  Musculoskeletal:        General: No swelling.     Cervical back: Neck supple.  Skin:    General: Skin is warm and dry.     Capillary Refill: Capillary refill takes less than 2 seconds.  Neurological:     Mental Status: She is alert.  Psychiatric:        Mood and Affect: Mood normal.     Procedures  Procedures  ED Course / MDM    Medical Decision Making Amount and/or Complexity of Data Reviewed Labs: ordered. Radiology: ordered. ECG/medicine tests: ordered.  Risk Prescription drug management.   Patient received an handoff.  Shortness of breath abdominal pain nausea vomiting pending PE study and CT abdomen pelvis.  CT imaging is reassuringly negative for acute pathology.  On my reevaluation she is feeling overall improved and is requesting an additional dose of steroids and a Lidoderm patch.  She received these medications and will be discharged with Lidoderm.  Patient discharged with outpatient follow-up.  Currently does not meet inpatient criteria.       Glendora Score, MD 04/02/23 [phone]

## 2023-04-02 NOTE — Telephone Encounter (Signed)
Front staff-it would be in the patient's best interest to do a follow-up visit with me within the next 30 days please assist  Reason compression fracture of the spine, chronic pain

## 2023-04-03 ENCOUNTER — Other Ambulatory Visit: Payer: Self-pay | Admitting: Radiology

## 2023-04-03 DIAGNOSIS — S32009D Unspecified fracture of unspecified lumbar vertebra, subsequent encounter for fracture with routine healing: Secondary | ICD-10-CM

## 2023-04-03 DIAGNOSIS — S82852A Displaced trimalleolar fracture of left lower leg, initial encounter for closed fracture: Secondary | ICD-10-CM

## 2023-04-03 DIAGNOSIS — S82892D Other fracture of left lower leg, subsequent encounter for closed fracture with routine healing: Secondary | ICD-10-CM

## 2023-04-03 NOTE — Telephone Encounter (Signed)
Order placed

## 2023-04-04 ENCOUNTER — Telehealth: Payer: Self-pay | Admitting: Family Medicine

## 2023-04-04 NOTE — Telephone Encounter (Signed)
Patient is requesting oxycodone 10 mg., she states has 8 pills left not enough for the weekend. Send to [facility]

## 2023-04-05 ENCOUNTER — Other Ambulatory Visit (HOSPITAL_COMMUNITY): Payer: Self-pay

## 2023-04-05 ENCOUNTER — Other Ambulatory Visit: Payer: Self-pay | Admitting: Family Medicine

## 2023-04-05 MED ORDER — OXYCODONE-ACETAMINOPHEN 10-325 MG PO TABS
1.0000 | ORAL_TABLET | ORAL | 0 refills | Status: DC | PRN
Start: 2023-04-05 — End: 2023-04-26
  Filled 2023-04-05: qty 126, 21d supply, fill #0

## 2023-04-06 ENCOUNTER — Emergency Department (HOSPITAL_COMMUNITY): Payer: HMO

## 2023-04-06 ENCOUNTER — Inpatient Hospital Stay (HOSPITAL_COMMUNITY)
Admission: EM | Admit: 2023-04-06 | Discharge: 2023-04-09 | DRG: 191 | Disposition: A | Discharge status: Home / Self Care | Payer: HMO | Attending: Internal Medicine | Admitting: Internal Medicine

## 2023-04-06 ENCOUNTER — Other Ambulatory Visit: Payer: Self-pay

## 2023-04-06 DIAGNOSIS — J9600 Acute respiratory failure, unspecified whether with hypoxia or hypercapnia: Secondary | ICD-10-CM | POA: Diagnosis not present

## 2023-04-06 DIAGNOSIS — F32A Depression, unspecified: Secondary | ICD-10-CM | POA: Diagnosis present

## 2023-04-06 DIAGNOSIS — M797 Fibromyalgia: Secondary | ICD-10-CM | POA: Diagnosis present

## 2023-04-06 DIAGNOSIS — F419 Anxiety disorder, unspecified: Secondary | ICD-10-CM | POA: Diagnosis present

## 2023-04-06 DIAGNOSIS — Z7982 Long term (current) use of aspirin: Secondary | ICD-10-CM

## 2023-04-06 DIAGNOSIS — K219 Gastro-esophageal reflux disease without esophagitis: Secondary | ICD-10-CM | POA: Diagnosis present

## 2023-04-06 DIAGNOSIS — F418 Other specified anxiety disorders: Secondary | ICD-10-CM | POA: Diagnosis present

## 2023-04-06 DIAGNOSIS — E669 Obesity, unspecified: Secondary | ICD-10-CM | POA: Diagnosis present

## 2023-04-06 DIAGNOSIS — J441 Chronic obstructive pulmonary disease with (acute) exacerbation: Secondary | ICD-10-CM | POA: Diagnosis not present

## 2023-04-06 DIAGNOSIS — D869 Sarcoidosis, unspecified: Secondary | ICD-10-CM | POA: Diagnosis present

## 2023-04-06 DIAGNOSIS — J9611 Chronic respiratory failure with hypoxia: Secondary | ICD-10-CM | POA: Diagnosis present

## 2023-04-06 DIAGNOSIS — Z6837 Body mass index (BMI) 37.0-37.9, adult: Secondary | ICD-10-CM

## 2023-04-06 DIAGNOSIS — F410 Panic disorder [episodic paroxysmal anxiety] without agoraphobia: Secondary | ICD-10-CM | POA: Diagnosis present

## 2023-04-06 DIAGNOSIS — Z91030 Bee allergy status: Secondary | ICD-10-CM

## 2023-04-06 DIAGNOSIS — R079 Chest pain, unspecified: Secondary | ICD-10-CM | POA: Diagnosis not present

## 2023-04-06 DIAGNOSIS — E2740 Unspecified adrenocortical insufficiency: Secondary | ICD-10-CM | POA: Diagnosis present

## 2023-04-06 DIAGNOSIS — J849 Interstitial pulmonary disease, unspecified: Secondary | ICD-10-CM | POA: Diagnosis present

## 2023-04-06 DIAGNOSIS — Z8249 Family history of ischemic heart disease and other diseases of the circulatory system: Secondary | ICD-10-CM

## 2023-04-06 DIAGNOSIS — Z91041 Radiographic dye allergy status: Secondary | ICD-10-CM

## 2023-04-06 DIAGNOSIS — Z79899 Other long term (current) drug therapy: Secondary | ICD-10-CM

## 2023-04-06 DIAGNOSIS — K5900 Constipation, unspecified: Secondary | ICD-10-CM | POA: Diagnosis present

## 2023-04-06 DIAGNOSIS — D860 Sarcoidosis of lung: Secondary | ICD-10-CM | POA: Diagnosis present

## 2023-04-06 DIAGNOSIS — Z79890 Hormone replacement therapy (postmenopausal): Secondary | ICD-10-CM

## 2023-04-06 DIAGNOSIS — R062 Wheezing: Secondary | ICD-10-CM | POA: Diagnosis not present

## 2023-04-06 DIAGNOSIS — G894 Chronic pain syndrome: Secondary | ICD-10-CM | POA: Diagnosis present

## 2023-04-06 DIAGNOSIS — Z1152 Encounter for screening for COVID-19: Secondary | ICD-10-CM

## 2023-04-06 DIAGNOSIS — E039 Hypothyroidism, unspecified: Secondary | ICD-10-CM | POA: Diagnosis present

## 2023-04-06 DIAGNOSIS — Z885 Allergy status to narcotic agent status: Secondary | ICD-10-CM

## 2023-04-06 LAB — BASIC METABOLIC PANEL
Anion gap: 9 (ref 5–15)
BUN: 16 mg/dL (ref 6–20)
CO2: 29 mmol/L (ref 22–32)
Calcium: 8.6 mg/dL — ABNORMAL LOW (ref 8.9–10.3)
Chloride: 99 mmol/L (ref 98–111)
Creatinine, Ser: 0.77 mg/dL (ref 0.44–1.00)
GFR, Estimated: >60 mL/min (ref >60)
Glucose, Bld: 110 mg/dL — ABNORMAL HIGH (ref 70–99)
Potassium: 4.4 mmol/L (ref 3.5–5.1)
Sodium: 137 mmol/L (ref 135–145)

## 2023-04-06 LAB — RESP PANEL BY RT-PCR (RSV, FLU A&B, COVID)  RVPGX2
Influenza A by PCR: NEGATIVE
Influenza B by PCR: NEGATIVE
Resp Syncytial Virus by PCR: NEGATIVE
SARS Coronavirus 2 by RT PCR: NEGATIVE

## 2023-04-06 LAB — CBC
HCT: 34.2 % — ABNORMAL LOW (ref 36.0–46.0)
Hemoglobin: 10.2 g/dL — ABNORMAL LOW (ref 12.0–15.0)
MCH: 27.8 pg (ref 26.0–34.0)
MCHC: 29.8 g/dL — ABNORMAL LOW (ref 30.0–36.0)
MCV: 93.2 fL (ref 80.0–100.0)
Platelets: 462 10*3/uL — ABNORMAL HIGH (ref 150–400)
RBC: 3.67 MIL/uL — ABNORMAL LOW (ref 3.87–5.11)
RDW: 15.1 % (ref 11.5–15.5)
WBC: 13.1 10*3/uL — ABNORMAL HIGH (ref 4.0–10.5)
nRBC: 0.4 % — ABNORMAL HIGH (ref 0.0–0.2)

## 2023-04-06 LAB — TROPONIN I (HIGH SENSITIVITY): Troponin I (High Sensitivity): 3 ng/L (ref <18)

## 2023-04-06 MED ORDER — METHYLPREDNISOLONE SODIUM SUCC 125 MG IJ SOLR
125.0000 mg | Freq: Once | INTRAMUSCULAR | Status: AC
  Administered 2023-04-06: 125 mg via INTRAVENOUS
  Filled 2023-04-06: qty 2

## 2023-04-06 MED ORDER — IPRATROPIUM-ALBUTEROL 0.5-2.5 (3) MG/3ML IN SOLN
3.0000 mL | Freq: Once | RESPIRATORY_TRACT | Status: AC
  Administered 2023-04-07: 3 mL via RESPIRATORY_TRACT
  Filled 2023-04-06: qty 3

## 2023-04-06 MED ORDER — FENTANYL CITRATE PF 50 MCG/ML IJ SOSY
50.0000 ug | PREFILLED_SYRINGE | Freq: Once | INTRAMUSCULAR | Status: AC
  Administered 2023-04-06: 50 ug via INTRAVENOUS
  Filled 2023-04-06: qty 1

## 2023-04-06 NOTE — ED Triage Notes (Signed)
Pt to ED via [facility] cc left sided chest/rib pain that began yesterday while laying down. Breathing makes it worse. Also complains of SOB, hx of COPD, on 3L chronic. Requiring 4L at this time. 1 ntg and 81mg  aspirin x4 given by EMS. Audible wheezing on arrival.

## 2023-04-06 NOTE — ED Provider Notes (Signed)
[place] EMERGENCY DEPARTMENT AT [facility] Provider Note   CSN: [id number] Arrival date & time: 04/06/23  2230     History {Add pertinent medical, surgical, social history, OB history to HPI:1} Chief Complaint  Patient presents with   Chest Pain    Kathleen [name] is a 62 y.o. female.  The history is provided by the patient.  Patient w/multiple medical additions presents with chest pain or shortness of breath. Patient reports over the past days she has had left-sided sharp pain that is worse with breathing and movement.  No falls in the past several days.  No fevers or hemoptysis.  She does report an episode of nausea vomiting and was unable to take her steroids.  She reports increased wheezing and shortness of breath.  She typically requires 3 to 4 L on a daily basis.  She is a non-smoker. Patient with recent fracture to her left ankle with cast in place     Home Medications Prior to Admission medications   Medication Sig Start Date End Date Taking? Authorizing Provider  albuterol (PROVENTIL) (2.5 MG/3ML) 0.083% nebulizer solution Take 3 mLs (2.5 mg total) by nebulization every 4 (four) hours as needed for wheezing or shortness of breath. Patient not taking: Reported on 03/13/2023 12/15/22 12/15/23  Shon Hale, MD  albuterol (VENTOLIN HFA) 108 (90 Base) MCG/ACT inhaler Inhale 2 puffs into the lungs every 6 (six) hours as needed for wheezing or shortness of breath. 12/15/22   Shon Hale, MD  aspirin EC 81 MG tablet Take 1 tablet (81 mg total) by mouth 2 (two) times daily. Swallow whole. 03/13/23 04/24/23  [name], MD  budesonide (PULMICORT) 0.5 MG/2ML nebulizer solution Inhale the contents of 1 vial via nebulizer two (2) times a day. 11/22/21     busPIRone (BUSPAR) 15 MG tablet Take 1 tablet (15 mg) by mouth 3 times daily. 03/07/23   [name], MD  calcitonin, salmon, (MIACALCIN) 200 UNIT/ACT nasal spray Use 1 spray in one nostril per day, alternate  nostrils every other day (may have 1 vial she should use this for at least 2 weeks but may use as long as 4 weeks) Patient not taking: Reported on 03/13/2023 03/01/23   [name], MD  DULoxetine (CYMBALTA) 60 MG capsule Take 1 capsule (60 mg) by mouth daily. 03/07/23   [name], MD  EPINEPHrine 0.3 mg/0.3 mL IJ SOAJ injection Inject 0.3 mg into the muscle as needed for anaphylaxis. 01/05/22   [name], MD  ferrous sulfate 325 (65 FE) MG EC tablet Take 325 mg by mouth once a week. Patient not taking: Reported on 03/21/2023    [provider]  hydrocortisone (CORTEF) 10 MG tablet Take 2 tablets by mouth at 8:00 am and 1 tablet at lunch. (May double dose on sick days x 3-5 days) 03/28/23   Roma Kayser, MD  hydrOXYzine (ATARAX) 25 MG tablet Take 1 tablet (25 mg total) by mouth 3 (three) times daily as needed for anxiety. 02/26/23 02/26/24  [name], MD  ipratropium-albuterol (DUONEB) 0.5-2.5 (3) MG/3ML SOLN Inhale 1 vial via nebulizer every 8 hours. Patient not taking: Reported on 03/13/2023 12/17/22     levothyroxine (SYNTHROID) 50 MCG tablet Take 1 tablet by mouth daily before breakfast. 10/24/22 06/10/23  Roma Kayser, MD  lidocaine (LIDODERM) 5 % Place 1 patch onto the skin daily. Remove & Discard patch within 12 hours or as directed by MD 04/02/23  Kommor, Madison, MD  linaclotide [facility]) 290 MCG CAPS capsule Take 1 capsule (290 mcg total) by mouth daily before breakfast. 03/28/22   [name], MD  naloxone [facility]) nasal spray 4 mg/0.1 mL Use as directed for opioid overdose 12/26/22   [name], MD  ondansetron (ZOFRAN-ODT) 8 MG disintegrating tablet Take 8 mg by mouth every 8 (eight) hours as needed for nausea or vomiting.    [provider]  oxyCODONE-acetaminophen (PERCOCET) 10-325 MG tablet Take 1 tablet by mouth every 4 (four) hours as needed for pain maximum 6/day not with any other form of oxycodone 04/05/23   Luking, Jonna Coup, MD   pantoprazole (PROTONIX) 40 MG tablet Take 1 tablet (40 mg total) by mouth 2 (two) times daily before a meal. 12/15/22 12/15/23  Emokpae, Courage, MD  predniSONE (DELTASONE) 5 MG tablet Take 1 tablet (5 mg total) by mouth in the morning. 07/04/22     rosuvastatin (CRESTOR) 10 MG tablet Take 1 tablet (10 mg total) by mouth daily. 07/03/22   [name], MD  senna-docusate (SENOKOT-S) 8.6-50 MG tablet Take 1 tablet by mouth 2 (two) times daily between meals as needed for mild constipation. Patient not taking: Reported on 03/21/2023 03/13/23   Almon Hercules, MD      Allergies    Bee venom, Contrast media [iodinated contrast media], Bactrim [sulfamethoxazole-trimethoprim], Codeine, and Morphine and codeine    Review of Systems   Review of Systems  Constitutional:  Negative for fever.  Respiratory:  Positive for cough and shortness of breath.     Physical Exam Updated Vital Signs BP 103/74 (BP Location: Left Arm)   Pulse 87   Temp (!) 97.4 F (36.3 C)   Resp 18   Ht 1.702 m (5\' 7" )   Wt 108.9 kg   LMP 05/17/2016   SpO2 99%   BMI 37.59 kg/m  Physical Exam CONSTITUTIONAL: Chronically ill-appearing HEAD: Normocephalic/atraumatic EYES: EOMI/PERRL ENMT: Mucous membranes moist NECK: supple no meningeal signs SPINE/BACK:entire spine nontender CV: S1/S2 noted, no murmurs/rubs/gallops noted LUNGS: Wheezing bilaterally Chest-tenderness to left chest wall, no bruising, no crepitus, no obvious masses.  No rash.  Chaperone present for exam ABDOMEN: soft, nontender, protuberant GU:no cva tenderness NEURO: Pt is awake/alert/appropriate, moves all extremitiesx4.  No facial droop.   EXTREMITIES: pulses normal/equal, full ROM Left lower extremity in cast SKIN: warm, color normal PSYCH: no abnormalities of mood noted, alert and oriented to situation  ED Results / Procedures / Treatments   Labs (all labs ordered are listed, but only abnormal results are displayed) Labs Reviewed  BASIC  METABOLIC PANEL - Abnormal; Notable for the following components:      Result Value   Glucose, Bld 110 (*)    Calcium 8.6 (*)    All other components within normal limits  CBC - Abnormal; Notable for the following components:   WBC 13.1 (*)    RBC 3.67 (*)    Hemoglobin 10.2 (*)    HCT 34.2 (*)    MCHC 29.8 (*)    Platelets 462 (*)    nRBC 0.4 (*)    All other components within normal limits  RESP PANEL BY RT-PCR (RSV, FLU A&B, COVID)  RVPGX2  TROPONIN I (HIGH SENSITIVITY)    EKG EKG Interpretation Date/Time:  Saturday April 06 2023 22:39:15 EDT Ventricular Rate:  85 PR Interval:  130 QRS Duration:  79 QT Interval:  335 QTC Calculation: 399 R Axis:   75  Text Interpretation: Sinus  rhythm Low voltage, precordial leads Borderline T wave abnormalities Confirmed by Zadie Rhine ([id number]) on 04/06/2023 11:11:00 PM  Radiology No results found.  Procedures Procedures  {Document cardiac monitor, telemetry assessment procedure when appropriate:1}  Medications Ordered in ED Medications  ipratropium-albuterol (DUONEB) 0.5-2.5 (3) MG/3ML nebulizer solution 3 mL (has no administration in time range)  methylPREDNISolone sodium succinate (SOLU-MEDROL) 125 mg/2 mL injection 125 mg (has no administration in time range)  fentaNYL (SUBLIMAZE) injection 50 mcg (has no administration in time range)    ED Course/ Medical Decision Making/ A&P Clinical Course as of 04/06/23 2340  Sat Apr 06, 2023  2331 WBC(!): 13.1 [DW]    Clinical Course User Index [DW] Zadie Rhine, MD   {   Click here for ABCD2, HEART and other calculatorsREFRESH Note before signing :1}                              Medical Decision Making Amount and/or Complexity of Data Reviewed Labs: ordered. Decision-making details documented in ED Course. Radiology: ordered.  Risk Prescription drug management.   This patient presents to the ED for concern of this of breath, this involves an extensive number of  treatment options, and is a complaint that carries with it a high risk of complications and morbidity.  The differential diagnosis includes but is not limited to Acute coronary syndrome, pneumonia, acute pulmonary edema, pneumothorax, acute anemia, pulmonary embolism   Comorbidities that complicate the patient evaluation: Patient's presentation is complicated by their history of chronic respiratory failure, pulmonary fibrosis  Social Determinants of Health: Patient's  poor mobility and frequent ER visits   increases the complexity of managing their presentation  Additional history obtained: Records reviewed previous admission documents  Lab Tests: I Ordered, and personally interpreted labs.  The pertinent results include: Leukocytosis  Imaging Studies ordered: I ordered imaging studies including X-ray chest   I independently visualized and interpreted imaging which showed *** I agree with the radiologist interpretation  Cardiac Monitoring: The patient was maintained on a cardiac monitor.  I personally viewed and interpreted the cardiac monitor which showed an underlying rhythm of:  {cardiac monitor:26849}  Medicines ordered and prescription drug management: I ordered medication including nebulized medicine and steroids for wheezing Reevaluation of the patient after these medicines showed that the patient    {resolved/improved/worsened:23923::"improved"}  Test Considered: Patient is low risk / negative by ***, therefore do not feel that *** is indicated.  Critical Interventions:  ***  Consultations Obtained: I requested consultation with the {consultation:26851}, and discussed  findings as well as pertinent plan - they recommend: ***  Reevaluation: After the interventions noted above, I reevaluated the patient and found that they have :{resolved/improved/worsened:23923::"improved"}  Complexity of problems addressed: Patient's presentation is most consistent with   {OVFI:43329}  Disposition: After consideration of the diagnostic results and the patient's response to treatment,  I feel that the patent would benefit from {disposition:26850}.     {Document critical care time when appropriate:1} {Document review of labs and clinical decision tools ie heart score, Chads2Vasc2 etc:1}  {Document your independent review of radiology images, and any outside records:1} {Document your discussion with family members, caretakers, and with consultants:1} {Document social determinants of health affecting pt's care:1} {Document your decision making why or why not admission, treatments were needed:1} Final Clinical Impression(s) / ED Diagnoses Final diagnoses:  None    Rx / DC Orders ED Discharge Orders     None

## 2023-04-07 ENCOUNTER — Encounter (HOSPITAL_COMMUNITY): Payer: Self-pay | Admitting: Family Medicine

## 2023-04-07 DIAGNOSIS — Z91041 Radiographic dye allergy status: Secondary | ICD-10-CM | POA: Diagnosis not present

## 2023-04-07 DIAGNOSIS — E039 Hypothyroidism, unspecified: Secondary | ICD-10-CM | POA: Diagnosis not present

## 2023-04-07 DIAGNOSIS — M797 Fibromyalgia: Secondary | ICD-10-CM | POA: Diagnosis not present

## 2023-04-07 DIAGNOSIS — Z79899 Other long term (current) drug therapy: Secondary | ICD-10-CM | POA: Diagnosis not present

## 2023-04-07 DIAGNOSIS — Z91030 Bee allergy status: Secondary | ICD-10-CM | POA: Diagnosis not present

## 2023-04-07 DIAGNOSIS — Z6837 Body mass index (BMI) 37.0-37.9, adult: Secondary | ICD-10-CM | POA: Diagnosis not present

## 2023-04-07 DIAGNOSIS — D869 Sarcoidosis, unspecified: Secondary | ICD-10-CM | POA: Diagnosis not present

## 2023-04-07 DIAGNOSIS — E669 Obesity, unspecified: Secondary | ICD-10-CM | POA: Diagnosis not present

## 2023-04-07 DIAGNOSIS — Z885 Allergy status to narcotic agent status: Secondary | ICD-10-CM | POA: Diagnosis not present

## 2023-04-07 DIAGNOSIS — K219 Gastro-esophageal reflux disease without esophagitis: Secondary | ICD-10-CM | POA: Diagnosis not present

## 2023-04-07 DIAGNOSIS — J9600 Acute respiratory failure, unspecified whether with hypoxia or hypercapnia: Secondary | ICD-10-CM | POA: Diagnosis not present

## 2023-04-07 DIAGNOSIS — F419 Anxiety disorder, unspecified: Secondary | ICD-10-CM | POA: Diagnosis not present

## 2023-04-07 DIAGNOSIS — G894 Chronic pain syndrome: Secondary | ICD-10-CM | POA: Diagnosis not present

## 2023-04-07 DIAGNOSIS — F32A Depression, unspecified: Secondary | ICD-10-CM | POA: Diagnosis not present

## 2023-04-07 DIAGNOSIS — J9611 Chronic respiratory failure with hypoxia: Secondary | ICD-10-CM | POA: Diagnosis not present

## 2023-04-07 DIAGNOSIS — K5900 Constipation, unspecified: Secondary | ICD-10-CM | POA: Diagnosis not present

## 2023-04-07 DIAGNOSIS — E2740 Unspecified adrenocortical insufficiency: Secondary | ICD-10-CM | POA: Diagnosis not present

## 2023-04-07 DIAGNOSIS — Z79890 Hormone replacement therapy (postmenopausal): Secondary | ICD-10-CM | POA: Diagnosis not present

## 2023-04-07 DIAGNOSIS — F410 Panic disorder [episodic paroxysmal anxiety] without agoraphobia: Secondary | ICD-10-CM | POA: Diagnosis not present

## 2023-04-07 DIAGNOSIS — J441 Chronic obstructive pulmonary disease with (acute) exacerbation: Secondary | ICD-10-CM | POA: Diagnosis not present

## 2023-04-07 DIAGNOSIS — Z8249 Family history of ischemic heart disease and other diseases of the circulatory system: Secondary | ICD-10-CM | POA: Diagnosis not present

## 2023-04-07 DIAGNOSIS — R079 Chest pain, unspecified: Secondary | ICD-10-CM | POA: Diagnosis not present

## 2023-04-07 DIAGNOSIS — D860 Sarcoidosis of lung: Secondary | ICD-10-CM | POA: Diagnosis not present

## 2023-04-07 DIAGNOSIS — Z7982 Long term (current) use of aspirin: Secondary | ICD-10-CM | POA: Diagnosis not present

## 2023-04-07 DIAGNOSIS — J849 Interstitial pulmonary disease, unspecified: Secondary | ICD-10-CM | POA: Diagnosis not present

## 2023-04-07 DIAGNOSIS — Z1152 Encounter for screening for COVID-19: Secondary | ICD-10-CM | POA: Diagnosis not present

## 2023-04-07 LAB — CBC
HCT: 34.4 % — ABNORMAL LOW (ref 36.0–46.0)
Hemoglobin: 10.1 g/dL — ABNORMAL LOW (ref 12.0–15.0)
MCH: 27.7 pg (ref 26.0–34.0)
MCHC: 29.4 g/dL — ABNORMAL LOW (ref 30.0–36.0)
MCV: 94.5 fL (ref 80.0–100.0)
Platelets: 473 10*3/uL — ABNORMAL HIGH (ref 150–400)
RBC: 3.64 MIL/uL — ABNORMAL LOW (ref 3.87–5.11)
RDW: 14.8 % (ref 11.5–15.5)
WBC: 14.8 10*3/uL — ABNORMAL HIGH (ref 4.0–10.5)
nRBC: 0.2 % (ref 0.0–0.2)

## 2023-04-07 LAB — BASIC METABOLIC PANEL
Anion gap: 16 — ABNORMAL HIGH (ref 5–15)
BUN: 17 mg/dL (ref 6–20)
CO2: 26 mmol/L (ref 22–32)
Calcium: 9 mg/dL (ref 8.9–10.3)
Chloride: 97 mmol/L — ABNORMAL LOW (ref 98–111)
Creatinine, Ser: 0.85 mg/dL (ref 0.44–1.00)
GFR, Estimated: >60 mL/min (ref >60)
Glucose, Bld: 192 mg/dL — ABNORMAL HIGH (ref 70–99)
Potassium: 4.3 mmol/L (ref 3.5–5.1)
Sodium: 139 mmol/L (ref 135–145)

## 2023-04-07 LAB — TROPONIN I (HIGH SENSITIVITY): Troponin I (High Sensitivity): 3 ng/L (ref <18)

## 2023-04-07 MED ORDER — BUSPIRONE HCL 5 MG PO TABS
15.0000 mg | ORAL_TABLET | Freq: Three times a day (TID) | ORAL | Status: DC
  Administered 2023-04-07 – 2023-04-09 (×8): 15 mg via ORAL
  Filled 2023-04-07 (×8): qty 3

## 2023-04-07 MED ORDER — ASPIRIN 81 MG PO TBEC
81.0000 mg | DELAYED_RELEASE_TABLET | Freq: Two times a day (BID) | ORAL | Status: DC
  Administered 2023-04-07 – 2023-04-09 (×5): 81 mg via ORAL
  Filled 2023-04-07 (×5): qty 1

## 2023-04-07 MED ORDER — IPRATROPIUM-ALBUTEROL 0.5-2.5 (3) MG/3ML IN SOLN
3.0000 mL | Freq: Four times a day (QID) | RESPIRATORY_TRACT | Status: DC
  Administered 2023-04-07 – 2023-04-09 (×9): 3 mL via RESPIRATORY_TRACT
  Filled 2023-04-07 (×10): qty 3

## 2023-04-07 MED ORDER — ONDANSETRON HCL 4 MG PO TABS: 4.0000 mg | ORAL_TABLET | Freq: Four times a day (QID) | ORAL | Status: DC | PRN

## 2023-04-07 MED ORDER — SODIUM CHLORIDE 0.9 % IV SOLN
1.0000 g | INTRAVENOUS | Status: DC
  Administered 2023-04-07 – 2023-04-09 (×3): 1 g via INTRAVENOUS
  Filled 2023-04-07 (×3): qty 10

## 2023-04-07 MED ORDER — LORAZEPAM 0.5 MG PO TABS
0.5000 mg | ORAL_TABLET | Freq: Four times a day (QID) | ORAL | Status: DC | PRN
  Administered 2023-04-07 – 2023-04-09 (×7): 0.5 mg via ORAL
  Filled 2023-04-07 (×7): qty 1

## 2023-04-07 MED ORDER — ENOXAPARIN SODIUM 40 MG/0.4ML IJ SOSY
40.0000 mg | PREFILLED_SYRINGE | INTRAMUSCULAR | Status: DC
  Administered 2023-04-07 – 2023-04-09 (×3): 40 mg via SUBCUTANEOUS
  Filled 2023-04-07 (×3): qty 0.4

## 2023-04-07 MED ORDER — METHYLPREDNISOLONE SODIUM SUCC 125 MG IJ SOLR: 125.0000 mg | Freq: Two times a day (BID) | INTRAMUSCULAR | Status: DC

## 2023-04-07 MED ORDER — ALBUTEROL SULFATE (2.5 MG/3ML) 0.083% IN NEBU
2.5000 mg | INHALATION_SOLUTION | RESPIRATORY_TRACT | Status: DC | PRN
  Filled 2023-04-07: qty 3

## 2023-04-07 MED ORDER — DULOXETINE HCL 60 MG PO CPEP
60.0000 mg | ORAL_CAPSULE | Freq: Every day | ORAL | Status: DC
  Administered 2023-04-07 – 2023-04-09 (×3): 60 mg via ORAL
  Filled 2023-04-07: qty 2
  Filled 2023-04-07 (×2): qty 1

## 2023-04-07 MED ORDER — HYDROCODONE-ACETAMINOPHEN 5-325 MG PO TABS
1.0000 | ORAL_TABLET | Freq: Once | ORAL | Status: AC
  Administered 2023-04-07: 1 via ORAL
  Filled 2023-04-07: qty 1

## 2023-04-07 MED ORDER — ROSUVASTATIN CALCIUM 10 MG PO TABS
10.0000 mg | ORAL_TABLET | Freq: Every day | ORAL | Status: DC
  Administered 2023-04-07 – 2023-04-09 (×3): 10 mg via ORAL
  Filled 2023-04-07 (×3): qty 1

## 2023-04-07 MED ORDER — PREDNISONE 20 MG PO TABS
40.0000 mg | ORAL_TABLET | Freq: Every day | ORAL | Status: DC
  Administered 2023-04-08 – 2023-04-09 (×2): 40 mg via ORAL
  Filled 2023-04-07 (×2): qty 2

## 2023-04-07 MED ORDER — ONDANSETRON HCL 4 MG/2ML IJ SOLN: 4.0000 mg | Freq: Four times a day (QID) | INTRAMUSCULAR | Status: DC | PRN

## 2023-04-07 MED ORDER — ACETAMINOPHEN 325 MG PO TABS: 650.0000 mg | ORAL_TABLET | Freq: Four times a day (QID) | ORAL | Status: DC | PRN

## 2023-04-07 MED ORDER — OXYCODONE-ACETAMINOPHEN 10-325 MG PO TABS: 1.0000 | ORAL_TABLET | ORAL | Status: DC | PRN

## 2023-04-07 MED ORDER — METHYLPREDNISOLONE SODIUM SUCC 125 MG IJ SOLR
80.0000 mg | Freq: Two times a day (BID) | INTRAMUSCULAR | Status: AC
  Administered 2023-04-07 (×2): 80 mg via INTRAVENOUS
  Filled 2023-04-07 (×2): qty 2

## 2023-04-07 MED ORDER — OXYCODONE HCL 5 MG PO TABS
5.0000 mg | ORAL_TABLET | ORAL | Status: DC | PRN
  Administered 2023-04-07 – 2023-04-09 (×8): 5 mg via ORAL
  Filled 2023-04-07 (×8): qty 1

## 2023-04-07 MED ORDER — PREDNISONE 20 MG PO TABS: 40.0000 mg | ORAL_TABLET | Freq: Every day | ORAL | Status: DC

## 2023-04-07 MED ORDER — SODIUM CHLORIDE 0.9% FLUSH
3.0000 mL | Freq: Two times a day (BID) | INTRAVENOUS | Status: DC
  Administered 2023-04-07 – 2023-04-08 (×5): 3 mL via INTRAVENOUS

## 2023-04-07 MED ORDER — ACETAMINOPHEN 650 MG RE SUPP: 650.0000 mg | Freq: Four times a day (QID) | RECTAL | Status: DC | PRN

## 2023-04-07 MED ORDER — POLYETHYLENE GLYCOL 3350 17 G PO PACK: 17.0000 g | PACK | Freq: Every day | ORAL | Status: DC | PRN

## 2023-04-07 MED ORDER — HYDROXYZINE HCL 25 MG PO TABS
25.0000 mg | ORAL_TABLET | Freq: Three times a day (TID) | ORAL | Status: DC | PRN
  Administered 2023-04-07: 25 mg via ORAL
  Filled 2023-04-07: qty 1

## 2023-04-07 MED ORDER — LEVOTHYROXINE SODIUM 50 MCG PO TABS
50.0000 ug | ORAL_TABLET | Freq: Every day | ORAL | Status: DC
  Administered 2023-04-07 – 2023-04-09 (×3): 50 ug via ORAL
  Filled 2023-04-07 (×3): qty 1

## 2023-04-07 MED ORDER — OXYCODONE-ACETAMINOPHEN 5-325 MG PO TABS
1.0000 | ORAL_TABLET | ORAL | Status: DC | PRN
  Administered 2023-04-07 – 2023-04-09 (×8): 1 via ORAL
  Filled 2023-04-07 (×8): qty 1

## 2023-04-07 MED ORDER — ALBUTEROL SULFATE (2.5 MG/3ML) 0.083% IN NEBU
10.0000 mg/h | INHALATION_SOLUTION | Freq: Once | RESPIRATORY_TRACT | Status: AC
  Administered 2023-04-07: 10 mg/h via RESPIRATORY_TRACT
  Filled 2023-04-07: qty 12

## 2023-04-07 MED ORDER — PANTOPRAZOLE SODIUM 40 MG PO TBEC
40.0000 mg | DELAYED_RELEASE_TABLET | Freq: Two times a day (BID) | ORAL | Status: DC
  Administered 2023-04-07 – 2023-04-09 (×5): 40 mg via ORAL
  Filled 2023-04-07 (×5): qty 1

## 2023-04-07 NOTE — Progress Notes (Signed)
Kathleen [name] is a 62 y.o. female with medical history significant for COPD, pulmonary sarcoidosis, interstitial lung disease, chronic pain, depression, anxiety, hypothyroidism, and adrenal insufficiency presents to the emergency department with shortness of breath and chest pain.  She was admitted for acute COPD exacerbation in the setting of chronic hypoxemic respiratory failure and started on IV steroids and breathing treatments as well as Rocephin.  Patient admitted after midnight and seen and evaluated at bedside with no new complaints or concerns noted.  She currently remains on her baseline 4 L nasal cannula.  Monitor a.m. labs and anticipate discharge in the next 24-48 hours if improved.  Total care time: 20 minutes.

## 2023-04-07 NOTE — ED Notes (Signed)
Complains of feeling cold. Noted to have sweated while sleeping. Warm, dry blanket given. Afebrile. No signs of distress.

## 2023-04-07 NOTE — ED Notes (Signed)
Pulmonary sarcoidosis, prednisone. Prednisone caused addison's. Hydrocortisone to treat addison's 20mg  daily at 8am  10mg  daily at lunch May double on sick days for 3-5 days.

## 2023-04-07 NOTE — Telephone Encounter (Signed)
Her medication was sent in as planned enough for 3 weeks if she is to schedule follow-up visit with me somewhere in the next month She is also to keep [place] updated

## 2023-04-07 NOTE — ED Notes (Addendum)
ED TO INPATIENT HANDOFF REPORT  ED Nurse Name and Phone #: [phone]  S Name/Age/Gender Kathleen [name] 61 y.o. female Room/Bed: APA06/APA06  Code Status   Code Status: Full Code  Home/SNF/Other Home Patient oriented to: self, place, time, and situation Is this baseline? Yes   Triage Complete: Triage complete  Chief Complaint COPD exacerbation (HCC) [J44.1]  Triage Note Pt to ED via [facility] cc left sided chest/rib pain that began yesterday while laying down. Breathing makes it worse. Also complains of SOB, hx of COPD, on 3L chronic. Requiring 4L at this time. 1 ntg and 81mg  aspirin x4 given by EMS. Audible wheezing on arrival.    Allergies Allergies  Allergen Reactions   Bee Venom Anaphylaxis   Contrast Media [Iodinated Contrast Media]     CT chest w/ contrast at OSH followed by SOB resolved w/ single dose steroids, never ENT swelling, intubation or pressors, has had multiple contrasted scans since   Bactrim [Sulfamethoxazole-Trimethoprim] Nausea And Vomiting   Codeine Itching   Morphine And Codeine Nausea And Vomiting and Other (See Comments)    Dizziness  Confusion    Level of Care/Admitting Diagnosis ED Disposition     ED Disposition  Admit   Condition  --   Comment  Hospital Area: [facility] [[id number]]  Level of Care: Telemetry [5]  Covid Evaluation: Confirmed COVID Negative  Diagnosis: COPD exacerbation [facility]) [[id number]]  Admitting Physician: Briscoe Deutscher [[id number]]  Attending Physician: Briscoe Deutscher [[id number]]          B Medical/Surgery History Past Medical History:  Diagnosis Date   Adrenal insufficiency (HCC)    Anemia    Anxiety    Avascular bone necrosis (HCC)    Breast fibrocystic disorder    Chronic pain    COPD (chronic obstructive pulmonary disease) (HCC)    Encounter for long-term opiate analgesic use 02/02/2020   Patient has chronic pain with femoral head avascular necrosis.  Is under pain contract   Fibromyalgia     Gastroesophageal reflux disease    Hypothyroidism    Mitral valve prolapse    Orthostatic hypotension    Peripheral neuropathy    PONV (postoperative nausea and vomiting)    Restrictive lung disease    Moderate to severe   Sarcoidosis    Biopsy proven - [facility]   SOB (shortness of breath)    chronic   Past Surgical History:  Procedure Laterality Date   BIOPSY  10/17/2018   Procedure: BIOPSY;  Surgeon: Malissa Hippo, MD;  [facility];  Service: Endoscopy;;  duodenum, antrum, gastric body   BIOPSY  03/03/2021   Procedure: BIOPSY;  Surgeon: Malissa Hippo, MD;  [facility];  Service: Endoscopy;;   BREAST LUMPECTOMY Bilateral 01/12/2011   CHOLECYSTECTOMY N/A 04/17/2021   Procedure: LAPAROSCOPIC CHOLECYSTECTOMY;  Surgeon: Franky Macho, MD;  Location: [facility];  Service: General;  Laterality: N/A;   COLONOSCOPY WITH PROPOFOL N/A 01/03/2022   Procedure: COLONOSCOPY WITH PROPOFOL;  Surgeon: Malissa Hippo, MD;  [facility];  Service: Endoscopy;  Laterality: N/A;  220   ESOPHAGEAL DILATION N/A 03/03/2021   Procedure: ESOPHAGEAL DILATION;  Surgeon: Malissa Hippo, MD;  [facility];  Service: Endoscopy;  Laterality: N/A;   ESOPHAGOGASTRODUODENOSCOPY (EGD) WITH PROPOFOL N/A 10/17/2018   Procedure: ESOPHAGOGASTRODUODENOSCOPY (EGD) WITH PROPOFOL;  Surgeon: Malissa Hippo, MD;  [facility];  Service: Endoscopy;  Laterality: N/A;  2:25   ESOPHAGOGASTRODUODENOSCOPY (EGD) WITH PROPOFOL  N/A 03/03/2021   Procedure: ESOPHAGOGASTRODUODENOSCOPY (EGD) WITH PROPOFOL;  Surgeon: Malissa Hippo, MD;  [facility];  Service: Endoscopy;  Laterality: N/A;  12:15   ORIF ANKLE FRACTURE Left 03/10/2023   Procedure: OPEN REDUCTION INTERNAL FIXATION (ORIF) ANKLE FRACTURE;  Surgeon: [name], MD;  Location: [facility] OR;  Service: Orthopedics;  Laterality: Left;   POLYPECTOMY  01/03/2022   Procedure: POLYPECTOMY INTESTINAL;  Surgeon: Malissa Hippo, MD;  [facility];  Service: Endoscopy;;   TUBAL LIGATION       A IV Location/Drains/Wounds Patient Lines/Drains/Airways Status     Active Line/Drains/Airways     Name Placement date Placement time Site Days   Peripheral IV 04/06/23 18 G Posterior;Right Forearm 04/06/23  2242  Forearm  1   Peripheral IV 04/06/23 20 G Anterior;Proximal;Right Forearm 04/06/23  2247  Forearm  1            Intake/Output Last 24 hours No intake or output data in the 24 hours ending 04/07/23 0752  Labs/Imaging Results for orders placed or performed during the hospital encounter of 04/06/23 (from the past 48 hour(s))  Basic metabolic panel     Status: Abnormal   Collection Time: 04/06/23 10:49 PM  Result Value Ref Range   Sodium 137 135 - 145 mmol/L   Potassium 4.4 3.5 - 5.1 mmol/L   Chloride 99 98 - 111 mmol/L   CO2 29 22 - 32 mmol/L   Glucose, Bld 110 (H) 70 - 99 mg/dL    Comment: Glucose reference range applies only to samples taken after fasting for at least 8 hours.   BUN 16 6 - 20 mg/dL   Creatinine, Ser 2.84 0.44 - 1.00 mg/dL   Calcium 8.6 (L) 8.9 - 10.3 mg/dL   GFR, Estimated >13 >24 mL/min    Comment: (NOTE) Calculated using the CKD-EPI Creatinine Equation (2021)    Anion gap 9 5 - 15    Comment: Performed at [facility], [address], [place], [place] [address]  CBC     Status: Abnormal   Collection Time: 04/06/23 10:49 PM  Result Value Ref Range   WBC 13.1 (H) 4.0 - 10.5 K/uL   RBC 3.67 (L) 3.87 - 5.11 MIL/uL   Hemoglobin 10.2 (L) 12.0 - 15.0 g/dL   HCT 72.5 (L) 36.6 - 44.0 %   MCV 93.2 80.0 - 100.0 fL   MCH 27.8 26.0 - 34.0 pg   MCHC 29.8 (L) 30.0 - 36.0 g/dL   RDW 34.7 42.5 - 95.6 %   Platelets 462 (H) 150 - 400 K/uL   nRBC 0.4 (H) 0.0 - 0.2 %    Comment: Performed at [facility], [address], [place], [place] [address]  Troponin I (High Sensitivity)     Status: None   Collection Time: 04/06/23 10:49 PM  Result Value Ref Range   Troponin I  (High Sensitivity) 3 <18 ng/L    Comment: (NOTE) Elevated high sensitivity troponin I (hsTnI) values and significant  changes across serial measurements may suggest ACS but many other  chronic and acute conditions are known to elevate hsTnI results.  Refer to the "Links" section for chest pain algorithms and additional  guidance. Performed at [facility], [address] [place], [place] [address]   Resp panel by RT-PCR (RSV, Flu A&B, Covid) Anterior Nasal Swab     Status: None   Collection Time: 04/06/23 10:49 PM   Specimen: Anterior Nasal Swab  Result Value  Ref Range   SARS Coronavirus 2 by RT PCR NEGATIVE NEGATIVE    Comment: (NOTE) SARS-CoV-2 target nucleic acids are NOT DETECTED.  The SARS-CoV-2 RNA is generally detectable in upper respiratory specimens during the acute phase of infection. The lowest concentration of SARS-CoV-2 viral copies this assay can detect is 138 copies/mL. A negative result does not preclude SARS-Cov-2 infection and should not be used as the sole basis for treatment or other patient management decisions. A negative result may occur with  improper specimen collection/handling, submission of specimen other than nasopharyngeal swab, presence of viral mutation(s) within the areas targeted by this assay, and inadequate number of viral copies(<138 copies/mL). A negative result must be combined with clinical observations, patient history, and epidemiological information. The expected result is Negative.  Fact Sheet for Patients:  BloggerCourse.com  Fact Sheet for Healthcare Providers:  SeriousBroker.it  This test is no t yet approved or cleared by the [place] FDA and  has been authorized for detection and/or diagnosis of SARS-CoV-2 by FDA under an Emergency Use Authorization (EUA). This EUA will remain  in effect (meaning this test can be used) for the duration of the COVID-19 declaration under  Section 564(b)(1) of the Act, 21 U.S.C.section 360bbb-3(b)(1), unless the authorization is terminated  or revoked sooner.       Influenza A by PCR NEGATIVE NEGATIVE   Influenza B by PCR NEGATIVE NEGATIVE    Comment: (NOTE) The Xpert Xpress SARS-CoV-2/FLU/RSV plus assay is intended as an aid in the diagnosis of influenza from Nasopharyngeal swab specimens and should not be used as a sole basis for treatment. Nasal washings and aspirates are unacceptable for Xpert Xpress SARS-CoV-2/FLU/RSV testing.  Fact Sheet for Patients: BloggerCourse.com  Fact Sheet for Healthcare Providers: SeriousBroker.it  This test is not yet approved or cleared by the [place] FDA and has been authorized for detection and/or diagnosis of SARS-CoV-2 by FDA under an Emergency Use Authorization (EUA). This EUA will remain in effect (meaning this test can be used) for the duration of the COVID-19 declaration under Section 564(b)(1) of the Act, 21 U.S.C. section 360bbb-3(b)(1), unless the authorization is terminated or revoked.     Resp Syncytial Virus by PCR NEGATIVE NEGATIVE    Comment: (NOTE) Fact Sheet for Patients: BloggerCourse.com  Fact Sheet for Healthcare Providers: SeriousBroker.it  This test is not yet approved or cleared by the [place] FDA and has been authorized for detection and/or diagnosis of SARS-CoV-2 by FDA under an Emergency Use Authorization (EUA). This EUA will remain in effect (meaning this test can be used) for the duration of the COVID-19 declaration under Section 564(b)(1) of the Act, 21 U.S.C. section 360bbb-3(b)(1), unless the authorization is terminated or revoked.  Performed at [facility], [address], [place], [place] [address]   Basic metabolic panel     Status: Abnormal   Collection Time: 04/07/23  4:48 AM  Result Value Ref Range   Sodium 139 135 - 145  mmol/L   Potassium 4.3 3.5 - 5.1 mmol/L   Chloride 97 (L) 98 - 111 mmol/L   CO2 26 22 - 32 mmol/L   Glucose, Bld 192 (H) 70 - 99 mg/dL    Comment: Glucose reference range applies only to samples taken after fasting for at least 8 hours.   BUN 17 6 - 20 mg/dL   Creatinine, Ser 6.57 0.44 - 1.00 mg/dL   Calcium 9.0 8.9 - 84.6 mg/dL   GFR, Estimated >96 >29 mL/min    Comment: (  NOTE) Calculated using the CKD-EPI Creatinine Equation (2021)    Anion gap 16 (H) 5 - 15    Comment: Performed at [facility], [address] [place], [place] [address]  CBC     Status: Abnormal   Collection Time: 04/07/23  4:48 AM  Result Value Ref Range   WBC 14.8 (H) 4.0 - 10.5 K/uL   RBC 3.64 (L) 3.87 - 5.11 MIL/uL   Hemoglobin 10.1 (L) 12.0 - 15.0 g/dL   HCT 29.5 (L) 62.1 - 30.8 %   MCV 94.5 80.0 - 100.0 fL   MCH 27.7 26.0 - 34.0 pg   MCHC 29.4 (L) 30.0 - 36.0 g/dL   RDW 65.7 84.6 - 96.2 %   Platelets 473 (H) 150 - 400 K/uL   nRBC 0.2 0.0 - 0.2 %    Comment: Performed at [facility], [address], [place], [place] [address]  Troponin I (High Sensitivity)     Status: None   Collection Time: 04/07/23  4:48 AM  Result Value Ref Range   Troponin I (High Sensitivity) 3 <18 ng/L    Comment: (NOTE) Elevated high sensitivity troponin I (hsTnI) values and significant  changes across serial measurements may suggest ACS but many other  chronic and acute conditions are known to elevate hsTnI results.  Refer to the "Links" section for chest pain algorithms and additional  guidance. Performed at [facility], [address] [place], [place] [address]    *Note: Due to a large number of results and/or encounters for the requested time period, some results have not been displayed. A complete set of results can be found in Results Review.   DG Chest 2 View  Result Date: 04/06/2023 CLINICAL DATA:  Chest pain. EXAM: CHEST - 2 VIEW COMPARISON:  Chest radiograph dated 04/02/2023. FINDINGS: There is diffuse chronic  intra coarsening and fibrosis. No consolidative changes. There is no pleural effusion or pneumothorax. Stable cardiac silhouette. No acute osseous pathology. IMPRESSION: 1. No acute cardiopulmonary process. 2. Chronic interstitial lung disease. Electronically Signed   By: Elgie Collard M.D.   On: 04/06/2023 23:38    Pending Labs Unresulted Labs (From admission, onward)     Start     Ordered   04/14/23 0500  Creatinine, serum  (enoxaparin (LOVENOX)    CrCl >/= 30 ml/min)  Weekly,   R     Comments: while on enoxaparin therapy    04/07/23 0301   04/07/23 0500  Basic metabolic panel  Daily,   R      04/07/23 0301   04/07/23 0500  CBC  Daily,   R      04/07/23 0301   04/07/23 0300  Expectorated Sputum Assessment w Gram Stain, Rflx to Resp Cult  (COPD / Pneumonia / Cellulitis / Lower Extremity Wound)  Once,   R        04/07/23 0301            Vitals/Pain Today's Vitals   04/07/23 0400 04/07/23 0600 04/07/23 0628 04/07/23 0730  BP:  (!) 116/51  (!) 130/57  Pulse:  86  78  Resp:  13  17  Temp:   98.2 F (36.8 C) 97.7 F (36.5 C)  TempSrc:    Oral  SpO2:  96%  98%  Weight:      Height:      PainSc: 10-Worst pain ever   0-No pain    Isolation Precautions No active isolations  Medications Medications  aspirin EC tablet 81  mg (has no administration in time range)  rosuvastatin (CRESTOR) tablet 10 mg (has no administration in time range)  busPIRone (BUSPAR) tablet 15 mg (has no administration in time range)  DULoxetine (CYMBALTA) DR capsule 60 mg (has no administration in time range)  hydrOXYzine (ATARAX) tablet 25 mg (25 mg Oral Given 04/07/23 0409)  levothyroxine (SYNTHROID) tablet 50 mcg (50 mcg Oral Given 04/07/23 0627)  pantoprazole (PROTONIX) EC tablet 40 mg (40 mg Oral Given 04/07/23 0724)  enoxaparin (LOVENOX) injection 40 mg (40 mg Subcutaneous Given 04/07/23 0725)  cefTRIAXone (ROCEPHIN) 1 g in sodium chloride 0.9 % 100 mL IVPB (0 g Intravenous Stopped 04/07/23 0431)   methylPREDNISolone sodium succinate (SOLU-MEDROL) 125 mg/2 mL injection 125 mg (has no administration in time range)    Followed by  predniSONE (DELTASONE) tablet 40 mg (has no administration in time range)  ipratropium-albuterol (DUONEB) 0.5-2.5 (3) MG/3ML nebulizer solution 3 mL (3 mLs Nebulization Not Given 04/07/23 0331)  albuterol (PROVENTIL) (2.5 MG/3ML) 0.083% nebulizer solution 2.5 mg (has no administration in time range)  sodium chloride flush (NS) 0.9 % injection 3 mL (3 mLs Intravenous Given 04/07/23 0322)  acetaminophen (TYLENOL) tablet 650 mg (has no administration in time range)    Or  acetaminophen (TYLENOL) suppository 650 mg (has no administration in time range)  polyethylene glycol (MIRALAX / GLYCOLAX) packet 17 g (has no administration in time range)  ondansetron (ZOFRAN) tablet 4 mg (has no administration in time range)    Or  ondansetron (ZOFRAN) injection 4 mg (has no administration in time range)  oxyCODONE-acetaminophen (PERCOCET/ROXICET) 5-325 MG per tablet 1 tablet (has no administration in time range)    And  oxyCODONE (Oxy IR/ROXICODONE) immediate release tablet 5 mg (5 mg Oral Given 04/07/23 0404)  ipratropium-albuterol (DUONEB) 0.5-2.5 (3) MG/3ML nebulizer solution 3 mL (3 mLs Nebulization Given 04/07/23 0001)  methylPREDNISolone sodium succinate (SOLU-MEDROL) 125 mg/2 mL injection 125 mg (125 mg Intravenous Given 04/06/23 2354)  fentaNYL (SUBLIMAZE) injection 50 mcg (50 mcg Intravenous Given 04/06/23 2354)  albuterol (PROVENTIL) (2.5 MG/3ML) 0.083% nebulizer solution (10 mg/hr Nebulization Given 04/07/23 0035)  HYDROcodone-acetaminophen (NORCO/VICODIN) 5-325 MG per tablet 1 tablet (1 tablet Oral Given 04/07/23 0050)    Mobility walks     Focused Assessments Cardiac Assessment Handoff:    Lab Results  Component Value Date   CKTOTAL 11 02/11/2013   TROPONINI <0.03 11/04/2017   Lab Results  Component Value Date   DDIMER 0.83 (H) 02/25/2023   Does the  Patient currently have chest pain? Yes    R Recommendations: See Admitting Provider Note  Report given to:   Additional Notes: 4 L/min via n/c at home. Audible wheezing. Nitroglycerin and ASA given by EMS. Pt has had pain medication, nebs, synthyroid, lovenox and protonix.

## 2023-04-07 NOTE — ED Notes (Signed)
Pt moved to inpatient bed for comfort per request. Rating pain 10/10 - meds given. Pt started endorsing panic attack after moving to bed - hydroxyzine given per prn order. VSS. Pt able to stand and pivot to [facility] and into inpatient bed without difficulty. Dressed into hospital gown and blankets provided.

## 2023-04-07 NOTE — Plan of Care (Signed)
  Problem: Education: Goal: Knowledge of disease or condition will improve Outcome: Progressing Goal: Knowledge of the prescribed therapeutic regimen will improve Outcome: Progressing   

## 2023-04-07 NOTE — Telephone Encounter (Signed)
Please see other message.

## 2023-04-07 NOTE — ED Notes (Signed)
Up to recliner chair for breakfast. Linen saturated with sweat. Clean dry gown and linen provided. Eating breakfast. Family at bedside. Audible expiratory wheezing noted throughout with moderate dyspnea. RT informed to evaluate for possible prn albuterol nebulizer.

## 2023-04-07 NOTE — Progress Notes (Signed)
   04/07/23 1453  TOC Brief Assessment  Insurance and Status Reviewed  Patient has primary care physician Yes  Home environment has been reviewed From home with spouse  Prior level of function: Independent  Prior/Current Home Services No current home services  Social Determinants of Health Reivew SDOH reviewed no interventions necessary  Readmission risk has been reviewed Yes  Transition of care needs no transition of care needs at this time

## 2023-04-07 NOTE — H&P (Addendum)
History and Physical    Kathleen [name] OZH:[id number] DOB: November 18, 1960 DOA: 04/06/2023  PCP: [name], MD   Patient coming from: Home   Chief Complaint: SOB, chest pain   HPI: Kathleen [name] is a 62 y.o. female with medical history significant for COPD, pulmonary sarcoidosis, interstitial lung disease, chronic pain, depression, anxiety, hypothyroidism, and adrenal insufficiency presents to the emergency department with shortness of breath and chest pain.  Patient reports at least 2 days of increased shortness of breath and wheezing with occasional productive cough.  She has also had intermittent chest pain over the same interval, worse with deep breath or certain movements.  She denies fevers, hemoptysis, or abdominal pain.  She called EMS and was treated with 1 nitroglycerin and 324 mg of aspirin prior to arrival in the ED.  There was no change in her symptoms with nitroglycerin.  ED Course: Upon arrival to the ED, patient is found to be afebrile and saturating low 90s on 4 L/min of supplemental oxygen with normal heart rate and systolic blood pressure of 90 and greater.  Chest x-ray is negative acute cardiopulmonary disease.  Labs are most notable for normal renal function, normal troponin, and negative respiratory virus panel.  Patient was treated with 125 mg IV Solu-Medrol, DuoNeb, continuous albuterol treatment, Norco, and fentanyl.  Review of Systems:  All other systems reviewed and apart from HPI, are negative.  Past Medical History:  Diagnosis Date   Adrenal insufficiency (HCC)    Anemia    Anxiety    Avascular bone necrosis (HCC)    Breast fibrocystic disorder    Chronic pain    COPD (chronic obstructive pulmonary disease) (HCC)    Encounter for long-term opiate analgesic use 02/02/2020   Patient has chronic pain with femoral head avascular necrosis.  Is under pain contract   Fibromyalgia    Gastroesophageal reflux disease    Hypothyroidism    Mitral valve prolapse     Orthostatic hypotension    Peripheral neuropathy    PONV (postoperative nausea and vomiting)    Restrictive lung disease    Moderate to severe   Sarcoidosis    Biopsy proven - [facility]   SOB (shortness of breath)    chronic    Past Surgical History:  Procedure Laterality Date   BIOPSY  10/17/2018   Procedure: BIOPSY;  Surgeon: Malissa Hippo, MD;  [facility]: [facility];  Service: Endoscopy;;  duodenum, antrum, gastric body   BIOPSY  03/03/2021   Procedure: BIOPSY;  Surgeon: Malissa Hippo, MD;  Location: [facility];  Service: Endoscopy;;   BREAST LUMPECTOMY Bilateral 01/12/2011   CHOLECYSTECTOMY N/A 04/17/2021   Procedure: LAPAROSCOPIC CHOLECYSTECTOMY;  Surgeon: Franky Macho, MD;  Location: [facility];  Service: General;  Laterality: N/A;   COLONOSCOPY WITH PROPOFOL N/A 01/03/2022   Procedure: COLONOSCOPY WITH PROPOFOL;  Surgeon: Malissa Hippo, MD;  [facility];  Service: Endoscopy;  Laterality: N/A;  220   ESOPHAGEAL DILATION N/A 03/03/2021   Procedure: ESOPHAGEAL DILATION;  Surgeon: Malissa Hippo, MD;  [facility]: [facility];  Service: Endoscopy;  Laterality: N/A;   ESOPHAGOGASTRODUODENOSCOPY (EGD) WITH PROPOFOL N/A 10/17/2018   Procedure: ESOPHAGOGASTRODUODENOSCOPY (EGD) WITH PROPOFOL;  Surgeon: Malissa Hippo, MD;  [facility];  Service: Endoscopy;  Laterality: N/A;  2:25   ESOPHAGOGASTRODUODENOSCOPY (EGD) WITH PROPOFOL N/A 03/03/2021   Procedure: ESOPHAGOGASTRODUODENOSCOPY (EGD) WITH PROPOFOL;  Surgeon: Malissa Hippo, MD;  [facility];  Service: Endoscopy;  Laterality: N/A;  12:15   ORIF ANKLE FRACTURE Left 03/10/2023   Procedure: OPEN REDUCTION INTERNAL FIXATION (ORIF) ANKLE FRACTURE;  Surgeon: [name], MD;  Location: [facility];  Service: Orthopedics;  Laterality: Left;   POLYPECTOMY  01/03/2022   Procedure: POLYPECTOMY INTESTINAL;  Surgeon: Malissa Hippo, MD;  Location: [facility];  Service: Endoscopy;;   TUBAL  LIGATION      Social History:   reports that she has never smoked. She has never been exposed to tobacco smoke. She has never used smokeless tobacco. She reports that she does not drink alcohol and does not use drugs.  Allergies  Allergen Reactions   Bee Venom Anaphylaxis   Contrast Media [Iodinated Contrast Media]     CT chest w/ contrast at OSH followed by SOB resolved w/ single dose steroids, never ENT swelling, intubation or pressors, has had multiple contrasted scans since   Bactrim [Sulfamethoxazole-Trimethoprim] Nausea And Vomiting   Codeine Itching   Morphine And Codeine Nausea And Vomiting and Other (See Comments)    Dizziness  Confusion    Family History  Problem Relation Age of Onset   Cardiomyopathy Mother    Breast cancer Mother    Prostate cancer Father    Heart attack Maternal Grandmother    Heart attack Maternal Grandfather    Heart attack Paternal Grandmother    Bipolar disorder Daughter    Heart disease Maternal Aunt    Neurofibromatosis Cousin    Colon cancer Neg Hx      Prior to Admission medications   Medication Sig Start Date End Date Taking? Authorizing Provider  albuterol (PROVENTIL) (2.5 MG/3ML) 0.083% nebulizer solution Take 3 mLs (2.5 mg total) by nebulization every 4 (four) hours as needed for wheezing or shortness of breath. Patient not taking: Reported on 03/13/2023 12/15/22 12/15/23  Shon Hale, MD  albuterol (VENTOLIN HFA) 108 (90 Base) MCG/ACT inhaler Inhale 2 puffs into the lungs every 6 (six) hours as needed for wheezing or shortness of breath. 12/15/22   Shon Hale, MD  aspirin EC 81 MG tablet Take 1 tablet (81 mg total) by mouth 2 (two) times daily. Swallow whole. 03/13/23 04/24/23  [name], MD  budesonide (PULMICORT) 0.5 MG/2ML nebulizer solution Inhale the contents of 1 vial via nebulizer two (2) times a day. 11/22/21     busPIRone (BUSPAR) 15 MG tablet Take 1 tablet (15 mg) by mouth 3 times daily. 03/07/23   [name],  MD  calcitonin, salmon, (MIACALCIN) 200 UNIT/ACT nasal spray Use 1 spray in one nostril per day, alternate nostrils every other day (may have 1 vial she should use this for at least 2 weeks but may use as long as 4 weeks) Patient not taking: Reported on 03/13/2023 03/01/23   [name], MD  DULoxetine (CYMBALTA) 60 MG capsule Take 1 capsule (60 mg) by mouth daily. 03/07/23   [name], MD  EPINEPHrine 0.3 mg/0.3 mL IJ SOAJ injection Inject 0.3 mg into the muscle as needed for anaphylaxis. 01/05/22   [name], MD  ferrous sulfate 325 (65 FE) MG EC tablet Take 325 mg by mouth once a week. Patient not taking: Reported on 03/21/2023    [provider]  hydrocortisone (CORTEF) 10 MG tablet Take 2 tablets by mouth at 8:00 am and 1 tablet at lunch. (May double dose on sick days x 3-5 days) 03/28/23   Roma Kayser, MD  hydrOXYzine (ATARAX) 25 MG tablet Take 1 tablet (25  mg total) by mouth 3 (three) times daily as needed for anxiety. 02/26/23 02/26/24  [name], MD  ipratropium-albuterol (DUONEB) 0.5-2.5 (3) MG/3ML SOLN Inhale 1 vial via nebulizer every 8 hours. Patient not taking: Reported on 03/13/2023 12/17/22     levothyroxine (SYNTHROID) 50 MCG tablet Take 1 tablet by mouth daily before breakfast. 10/24/22 06/10/23  Roma Kayser, MD  lidocaine (LIDODERM) 5 % Place 1 patch onto the skin daily. Remove & Discard patch within 12 hours or as directed by MD 04/02/23   Kommor, Wyn Forster, MD  linaclotide [facility]) 290 MCG CAPS capsule Take 1 capsule (290 mcg total) by mouth daily before breakfast. 03/28/22   [name], MD  naloxone [facility]) nasal spray 4 mg/0.1 mL Use as directed for opioid overdose 12/26/22   [name], MD  ondansetron (ZOFRAN-ODT) 8 MG disintegrating tablet Take 8 mg by mouth every 8 (eight) hours as needed for nausea or vomiting.    [provider]  oxyCODONE-acetaminophen (PERCOCET) 10-325 MG tablet Take 1 tablet by mouth every 4 (four) hours  as needed for pain maximum 6/day not with any other form of oxycodone 04/05/23   Luking, Jonna Coup, MD  pantoprazole (PROTONIX) 40 MG tablet Take 1 tablet (40 mg total) by mouth 2 (two) times daily before a meal. 12/15/22 12/15/23  Emokpae, Courage, MD  predniSONE (DELTASONE) 5 MG tablet Take 1 tablet (5 mg total) by mouth in the morning. 07/04/22     rosuvastatin (CRESTOR) 10 MG tablet Take 1 tablet (10 mg total) by mouth daily. 07/03/22   [name], MD  senna-docusate (SENOKOT-S) 8.6-50 MG tablet Take 1 tablet by mouth 2 (two) times daily between meals as needed for mild constipation. Patient not taking: Reported on 03/21/2023 03/13/23   Almon Hercules, MD    Physical Exam: Vitals:   04/07/23 0153 04/07/23 0200 04/07/23 0215 04/07/23 0225  BP:  (!) 112/51 122/72   Pulse:  92 96   Resp:   20   Temp:    98.6 F (37 C)  TempSrc:    Rectal  SpO2: 91% 96% 96%   Weight:      Height:        Constitutional: NAD, no pallor or diaphoresis   Eyes: PERTLA, lids and conjunctivae normal ENMT: Mucous membranes are moist. Posterior pharynx clear of any exudate or lesions.   Neck: supple, no masses  Respiratory: Diminished bilaterally with prolonged expiratory phase and wheezing. Dyspneic with speech. Cardiovascular: S1 & S2 heard, regular rate and rhythm. No extremity edema.   Abdomen: no tenderness, soft. Bowel sounds active.  Musculoskeletal: no clubbing / cyanosis. No joint deformity upper and lower extremities.   Skin: no significant rashes, lesions, ulcers. Warm, dry, well-perfused. Neurologic: CN 2-12 grossly intact. Moving all extremities. Alert and oriented.  Psychiatric: Calm. Cooperative.    Labs and Imaging on Admission: I have personally reviewed following labs and imaging studies  CBC: Recent Labs  Lab 04/02/23 0402 04/06/23 2249  WBC 11.2* 13.1*  HGB 10.4* 10.2*  HCT 34.9* 34.2*  MCV 94.1 93.2  PLT 424* 462*   Basic Metabolic Panel: Recent Labs  Lab 04/02/23 0402  04/06/23 2249  NA 139 137  K 3.4* 4.4  CL 102 99  CO2 29 29  GLUCOSE 141* 110*  BUN 7 16  CREATININE 0.87 0.77  CALCIUM 8.4* 8.6*   GFR: Estimated Creatinine Clearance: 95 mL/min (by C-G formula based on SCr of 0.77 mg/dL). Liver Function Tests:  Recent Labs  Lab 04/02/23 0402  AST 17  ALT 16  ALKPHOS 115  BILITOT 0.2*  PROT 6.4*  ALBUMIN 3.4*   No results for input(s): "LIPASE", "AMYLASE" in the last 168 hours. No results for input(s): "AMMONIA" in the last 168 hours. Coagulation Profile: No results for input(s): "INR", "PROTIME" in the last 168 hours. Cardiac Enzymes: No results for input(s): "CKTOTAL", "CKMB", "CKMBINDEX", "TROPONINI" in the last 168 hours. BNP (last 3 results) No results for input(s): "PROBNP" in the last 8760 hours. HbA1C: No results for input(s): "HGBA1C" in the last 72 hours. CBG: No results for input(s): "GLUCAP" in the last 168 hours. Lipid Profile: No results for input(s): "CHOL", "HDL", "LDLCALC", "TRIG", "CHOLHDL", "LDLDIRECT" in the last 72 hours. Thyroid Function Tests: No results for input(s): "TSH", "T4TOTAL", "FREET4", "T3FREE", "THYROIDAB" in the last 72 hours. Anemia Panel: No results for input(s): "VITAMINB12", "FOLATE", "FERRITIN", "TIBC", "IRON", "RETICCTPCT" in the last 72 hours. Urine analysis:    Component Value Date/Time   COLORURINE STRAW (A) 03/11/2023 1803   APPEARANCEUR CLEAR 03/11/2023 1803   LABSPEC 1.005 03/11/2023 1803   PHURINE 6.0 03/11/2023 1803   GLUCOSEU 50 (A) 03/11/2023 1803   GLUCOSEU NEGATIVE 05/29/2010 1622   HGBUR NEGATIVE 03/11/2023 1803   BILIRUBINUR NEGATIVE 03/11/2023 1803   BILIRUBINUR + 11/08/2020 1604   KETONESUR NEGATIVE 03/11/2023 1803   PROTEINUR NEGATIVE 03/11/2023 1803   UROBILINOGEN 1.0 04/30/2020 0831   UROBILINOGEN 0.2 06/16/2014 0942   NITRITE NEGATIVE 03/11/2023 1803   LEUKOCYTESUR NEGATIVE 03/11/2023 1803   Sepsis Labs: @LABRCNTIP (procalcitonin:4,lacticidven:4) ) Recent  Results (from the past 240 hour(s))  Resp panel by RT-PCR (RSV, Flu A&B, Covid) Anterior Nasal Swab     Status: None   Collection Time: 04/06/23 10:49 PM   Specimen: Anterior Nasal Swab  Result Value Ref Range Status   SARS Coronavirus 2 by RT PCR NEGATIVE NEGATIVE Final    Comment: (NOTE) SARS-CoV-2 target nucleic acids are NOT DETECTED.  The SARS-CoV-2 RNA is generally detectable in upper respiratory specimens during the acute phase of infection. The lowest concentration of SARS-CoV-2 viral copies this assay can detect is 138 copies/mL. A negative result does not preclude SARS-Cov-2 infection and should not be used as the sole basis for treatment or other patient management decisions. A negative result may occur with  improper specimen collection/handling, submission of specimen other than nasopharyngeal swab, presence of viral mutation(s) within the areas targeted by this assay, and inadequate number of viral copies(<138 copies/mL). A negative result must be combined with clinical observations, patient history, and epidemiological information. The expected result is Negative.  Fact Sheet for Patients:  BloggerCourse.com  Fact Sheet for Healthcare Providers:  SeriousBroker.it  This test is no t yet approved or cleared by the [place] FDA and  has been authorized for detection and/or diagnosis of SARS-CoV-2 by FDA under an Emergency Use Authorization (EUA). This EUA will remain  in effect (meaning this test can be used) for the duration of the COVID-19 declaration under Section 564(b)(1) of the Act, 21 U.S.C.section 360bbb-3(b)(1), unless the authorization is terminated  or revoked sooner.       Influenza A by PCR NEGATIVE NEGATIVE Final   Influenza B by PCR NEGATIVE NEGATIVE Final    Comment: (NOTE) The Xpert Xpress SARS-CoV-2/FLU/RSV plus assay is intended as an aid in the diagnosis of influenza from Nasopharyngeal  swab specimens and should not be used as a sole basis for treatment. Nasal washings and aspirates are unacceptable for Xpert Xpress  SARS-CoV-2/FLU/RSV testing.  Fact Sheet for Patients: BloggerCourse.com  Fact Sheet for Healthcare Providers: SeriousBroker.it  This test is not yet approved or cleared by the [place] FDA and has been authorized for detection and/or diagnosis of SARS-CoV-2 by FDA under an Emergency Use Authorization (EUA). This EUA will remain in effect (meaning this test can be used) for the duration of the COVID-19 declaration under Section 564(b)(1) of the Act, 21 U.S.C. section 360bbb-3(b)(1), unless the authorization is terminated or revoked.     Resp Syncytial Virus by PCR NEGATIVE NEGATIVE Final    Comment: (NOTE) Fact Sheet for Patients: BloggerCourse.com  Fact Sheet for Healthcare Providers: SeriousBroker.it  This test is not yet approved or cleared by the [place] FDA and has been authorized for detection and/or diagnosis of SARS-CoV-2 by FDA under an Emergency Use Authorization (EUA). This EUA will remain in effect (meaning this test can be used) for the duration of the COVID-19 declaration under Section 564(b)(1) of the Act, 21 U.S.C. section 360bbb-3(b)(1), unless the authorization is terminated or revoked.  Performed at [facility], [address] [place], [place] [address]      Radiological Exams on Admission: DG Chest 2 View  Result Date: 04/06/2023 CLINICAL DATA:  Chest pain. EXAM: CHEST - 2 VIEW COMPARISON:  Chest radiograph dated 04/02/2023. FINDINGS: There is diffuse chronic intra coarsening and fibrosis. No consolidative changes. There is no pleural effusion or pneumothorax. Stable cardiac silhouette. No acute osseous pathology. IMPRESSION: 1. No acute cardiopulmonary process. 2. Chronic interstitial lung disease. Electronically  Signed   By: Elgie Collard M.D.   On: 04/06/2023 23:38    EKG: Independently reviewed. Sinus rhythm.   Assessment/Plan   1. COPD exacerbation; chronic hypoxic respiratory failure; pulmonary sarcoidosis  - Culture sputum, continue systemic steroids, start antibiotic, schedule DuoNebs, use additional albuterol as needed, continue supplemental O2    2. Chest pain  - Atypical, unchanged with NTG   - No acute ischemic changes noted on EKG, no acute findings on CXR, and initial troponin normal  - CTA chest negative for PE earlier this week - Continue cardiac monitoring, repeat troponin, continue ASA and Crestor    3. Depression, anxiety  - Continue Cymbalta, Buspar, and as-needed hydroxyzine   4. Chronic pain  - Prescription database reviewed  - Continue Cymbalta, continue Percocet as-needed   5. Adrenal insufficiency  - Not in crisis, currently on increased steroids for COPD exacerbation    DVT prophylaxis: Lovenox  Code Status: full  Level of Care: Level of care: Telemetry Family Communication: None present  Disposition Plan:  Patient is from: home  Anticipated d/c is to: TBD Anticipated d/c date is: 8/12 or 04/09/23  Patient currently: Pending improved respiratory status, second troponin  Consults called: None  Admission status: Observation     Briscoe Deutscher, MD [facility]  04/07/2023, 3:01 AM

## 2023-04-08 DIAGNOSIS — J441 Chronic obstructive pulmonary disease with (acute) exacerbation: Secondary | ICD-10-CM | POA: Diagnosis not present

## 2023-04-08 MED ORDER — SORBITOL 70 % SOLN
960.0000 mL | TOPICAL_OIL | Freq: Once | ORAL | Status: AC
  Administered 2023-04-08: 960 mL via RECTAL
  Filled 2023-04-08: qty 240

## 2023-04-08 MED ORDER — TRAZODONE HCL 50 MG PO TABS
50.0000 mg | ORAL_TABLET | Freq: Every day | ORAL | Status: DC
  Administered 2023-04-08: 50 mg via ORAL
  Filled 2023-04-08: qty 1

## 2023-04-08 NOTE — Progress Notes (Signed)
Mobility Specialist Progress Note:    04/08/23 1040  Mobility  Activity Transferred to/from [facility]  Level of Assistance Moderate assist, patient does 50-74%  Assistive Device Front wheel walker  Distance Ambulated (ft) 4 ft  Range of Motion/Exercises Active;All extremities  LLE Weight Bearing TWB  Activity Response Tolerated well  Mobility Referral Yes  $Mobility charge 1 Mobility  Mobility Specialist Start Time (ACUTE ONLY) 1040  Mobility Specialist Stop Time (ACUTE ONLY) 1050  Mobility Specialist Time Calculation (min) (ACUTE ONLY) 10 min   Pt was received in bed, requested assistance to [facility]. Required ModA and RW to stand and pivot. Tolerated well, audible SOB, SpO2 99% on 4L. Returned pt to bed, all needs met.   Lawerance Bach Mobility Specialist Please contact via Special educational needs teacher or  Rehab office at [phone]

## 2023-04-08 NOTE — Progress Notes (Signed)
Nurse at bedside giving SMOG enema per Dr Sherryll Burger orders,patient had results from enema medium size dark color stool in the shape of balls.Patient tolerated enema well. Plan of care on going.

## 2023-04-08 NOTE — Progress Notes (Signed)
Patient requesting enema this am. Dr Sherryll Burger notified.Plan of care ongoing.

## 2023-04-08 NOTE — Progress Notes (Signed)
PROGRESS NOTE    Kathleen [name]  ZOX:[id number] DOB: Nov 05, 1960 DOA: 04/06/2023 PCP: [name], MD   Brief Narrative:    Kathleen [name] is a 62 y.o. female with medical history significant for COPD, pulmonary sarcoidosis, interstitial lung disease, chronic pain, depression, anxiety, hypothyroidism, and adrenal insufficiency presents to the emergency department with shortness of breath and chest pain.  She was admitted for acute COPD exacerbation in the setting of chronic hypoxemic respiratory failure and started on IV steroids and breathing treatments as well as Rocephin.   Assessment & Plan:   Principal Problem:   COPD exacerbation (HCC) Active Problems:   Interstitial lung disease (HCC)   Chronic respiratory failure with hypoxia (HCC)   Anxiety and depression   Sarcoidosis   Fibromyalgia   Chronic pain syndrome   COPD with acute exacerbation (HCC)  Assessment and Plan:  1. COPD exacerbation; chronic hypoxic respiratory failure; pulmonary sarcoidosis  - Culture sputum, continue systemic steroids, start antibiotic, schedule DuoNebs, use additional albuterol as needed, continue supplemental O2     2. Chest pain  - Atypical, unchanged with NTG   - No acute ischemic changes noted on EKG, no acute findings on CXR, and initial troponin normal  - CTA chest negative for PE earlier this week - Continue cardiac monitoring, repeat troponin, continue ASA and Crestor     3. Depression, anxiety  - Continue Cymbalta, Buspar, and as-needed hydroxyzine  -Trazodone bedtime to assist with sleep   4. Chronic pain  - Prescription database reviewed  - Continue Cymbalta, continue Percocet as-needed  -Enema for constipation   5. Adrenal insufficiency  - Not in crisis, currently on increased steroids for COPD exacerbation   6. Obesity -BMI 37.59   DVT prophylaxis:Lovenox Code Status: Full Family Communication: None at bedside Disposition Plan:  Status is: Inpatient Remains  inpatient appropriate because: Need for IV medications  Consultants:  None  Procedures:  None  Antimicrobials:  Anti-infectives (From admission, onward)    Start     Dose/Rate Route Frequency Ordered Stop   04/07/23 0315  cefTRIAXone (ROCEPHIN) 1 g in sodium chloride 0.9 % 100 mL IVPB        1 g 200 mL/hr over 30 Minutes Intravenous Every 24 hours 04/07/23 0301 04/12/23 0314      Subjective: Patient seen and evaluated today with minimal improvement in her symptoms.  She is complaining of some constipation and trouble sleeping and continues to have some shortness of breath.  Objective: Vitals:   04/08/23 0301 04/08/23 0525 04/08/23 0715 04/08/23 0858  BP:  121/62  (!) 141/67  Pulse:  69  88  Resp:  16    Temp:  99.1 F (37.3 C)  98.1 F (36.7 C)  TempSrc:    Oral  SpO2: 99% 97% 98% 98%  Weight:      Height:        Intake/Output Summary (Last 24 hours) at 04/08/2023 1119 Last data filed at 04/08/2023 0723 Gross per 24 hour  Intake 240 ml  Output --  Net 240 ml   Filed Weights   04/06/23 2243  Weight: 108.9 kg    Examination:  General exam: Appears calm and comfortable  Respiratory system: Clear to auscultation. Respiratory effort normal. Cardiovascular system: S1 & S2 heard, RRR.  Gastrointestinal system: Abdomen is soft Central nervous system: Alert and awake Extremities: No edema Skin: No significant lesions noted Psychiatry: Flat affect.    Data Reviewed: I have personally reviewed following labs and  imaging studies  CBC: Recent Labs  Lab 04/02/23 0402 04/06/23 2249 04/07/23 0448 04/08/23 0405  WBC 11.2* 13.1* 14.8* 12.3*  HGB 10.4* 10.2* 10.1* 9.7*  HCT 34.9* 34.2* 34.4* 32.5*  MCV 94.1 93.2 94.5 94.2  PLT 424* 462* 473* 443*   Basic Metabolic Panel: Recent Labs  Lab 04/02/23 0402 04/06/23 2249 04/07/23 0448 04/08/23 0405  NA 139 137 139 134*  K 3.4* 4.4 4.3 4.5  CL 102 99 97* 97*  CO2 29 29 26 28   GLUCOSE 141* 110* 192* 182*   BUN 7 16 17 16   CREATININE 0.87 0.77 0.85 0.71  CALCIUM 8.4* 8.6* 9.0 8.8*  MG  --   --   --  2.4   GFR: Estimated Creatinine Clearance: 95 mL/min (by C-G formula based on SCr of 0.71 mg/dL). Liver Function Tests: Recent Labs  Lab 04/02/23 0402  AST 17  ALT 16  ALKPHOS 115  BILITOT 0.2*  PROT 6.4*  ALBUMIN 3.4*   No results for input(s): "LIPASE", "AMYLASE" in the last 168 hours. No results for input(s): "AMMONIA" in the last 168 hours. Coagulation Profile: No results for input(s): "INR", "PROTIME" in the last 168 hours. Cardiac Enzymes: No results for input(s): "CKTOTAL", "CKMB", "CKMBINDEX", "TROPONINI" in the last 168 hours. BNP (last 3 results) No results for input(s): "PROBNP" in the last 8760 hours. HbA1C: No results for input(s): "HGBA1C" in the last 72 hours. CBG: No results for input(s): "GLUCAP" in the last 168 hours. Lipid Profile: No results for input(s): "CHOL", "HDL", "LDLCALC", "TRIG", "CHOLHDL", "LDLDIRECT" in the last 72 hours. Thyroid Function Tests: No results for input(s): "TSH", "T4TOTAL", "FREET4", "T3FREE", "THYROIDAB" in the last 72 hours. Anemia Panel: No results for input(s): "VITAMINB12", "FOLATE", "FERRITIN", "TIBC", "IRON", "RETICCTPCT" in the last 72 hours. Sepsis Labs: No results for input(s): "PROCALCITON", "LATICACIDVEN" in the last 168 hours.  Recent Results (from the past 240 hour(s))  Resp panel by RT-PCR (RSV, Flu A&B, Covid) Anterior Nasal Swab     Status: None   Collection Time: 04/06/23 10:49 PM   Specimen: Anterior Nasal Swab  Result Value Ref Range Status   SARS Coronavirus 2 by RT PCR NEGATIVE NEGATIVE Final    Comment: (NOTE) SARS-CoV-2 target nucleic acids are NOT DETECTED.  The SARS-CoV-2 RNA is generally detectable in upper respiratory specimens during the acute phase of infection. The lowest concentration of SARS-CoV-2 viral copies this assay can detect is 138 copies/mL. A negative result does not preclude  SARS-Cov-2 infection and should not be used as the sole basis for treatment or other patient management decisions. A negative result may occur with  improper specimen collection/handling, submission of specimen other than nasopharyngeal swab, presence of viral mutation(s) within the areas targeted by this assay, and inadequate number of viral copies(<138 copies/mL). A negative result must be combined with clinical observations, patient history, and epidemiological information. The expected result is Negative.  Fact Sheet for Patients:  BloggerCourse.com  Fact Sheet for Healthcare Providers:  SeriousBroker.it  This test is no t yet approved or cleared by the [place] FDA and  has been authorized for detection and/or diagnosis of SARS-CoV-2 by FDA under an Emergency Use Authorization (EUA). This EUA will remain  in effect (meaning this test can be used) for the duration of the COVID-19 declaration under Section 564(b)(1) of the Act, 21 U.S.C.section 360bbb-3(b)(1), unless the authorization is terminated  or revoked sooner.       Influenza A by PCR NEGATIVE NEGATIVE Final   Influenza  B by PCR NEGATIVE NEGATIVE Final    Comment: (NOTE) The Xpert Xpress SARS-CoV-2/FLU/RSV plus assay is intended as an aid in the diagnosis of influenza from Nasopharyngeal swab specimens and should not be used as a sole basis for treatment. Nasal washings and aspirates are unacceptable for Xpert Xpress SARS-CoV-2/FLU/RSV testing.  Fact Sheet for Patients: BloggerCourse.com  Fact Sheet for Healthcare Providers: SeriousBroker.it  This test is not yet approved or cleared by the [place] FDA and has been authorized for detection and/or diagnosis of SARS-CoV-2 by FDA under an Emergency Use Authorization (EUA). This EUA will remain in effect (meaning this test can be used) for the duration of  the COVID-19 declaration under Section 564(b)(1) of the Act, 21 U.S.C. section 360bbb-3(b)(1), unless the authorization is terminated or revoked.     Resp Syncytial Virus by PCR NEGATIVE NEGATIVE Final    Comment: (NOTE) Fact Sheet for Patients: BloggerCourse.com  Fact Sheet for Healthcare Providers: SeriousBroker.it  This test is not yet approved or cleared by the [place] FDA and has been authorized for detection and/or diagnosis of SARS-CoV-2 by FDA under an Emergency Use Authorization (EUA). This EUA will remain in effect (meaning this test can be used) for the duration of the COVID-19 declaration under Section 564(b)(1) of the Act, 21 U.S.C. section 360bbb-3(b)(1), unless the authorization is terminated or revoked.  Performed at [facility], [address], [place], [place] [address]          Radiology Studies: DG Chest 2 View  Result Date: 04/06/2023 CLINICAL DATA:  Chest pain. EXAM: CHEST - 2 VIEW COMPARISON:  Chest radiograph dated 04/02/2023. FINDINGS: There is diffuse chronic intra coarsening and fibrosis. No consolidative changes. There is no pleural effusion or pneumothorax. Stable cardiac silhouette. No acute osseous pathology. IMPRESSION: 1. No acute cardiopulmonary process. 2. Chronic interstitial lung disease. Electronically Signed   By: Elgie Collard M.D.   On: 04/06/2023 23:38        Scheduled Meds:  aspirin EC  81 mg Oral BID   busPIRone  15 mg Oral TID   DULoxetine  60 mg Oral Daily   enoxaparin (LOVENOX) injection  40 mg Subcutaneous Q24H   ipratropium-albuterol  3 mL Nebulization Q6H   levothyroxine  50 mcg Oral Q0600   pantoprazole  40 mg Oral BID AC   predniSONE  40 mg Oral Q breakfast   rosuvastatin  10 mg Oral Daily   sodium chloride flush  3 mL Intravenous Q12H   sorbitol, milk of mag, mineral oil, glycerin (SMOG) enema  960 mL Rectal Once   traZODone  50 mg Oral QHS   Continuous  Infusions:  cefTRIAXone (ROCEPHIN)  IV 1 g (04/08/23 0536)     LOS: 1 day    Time spent: 35 minutes     Hoover Brunette, DO [facility]  If 7PM-7AM, please contact night-coverage [email or web address] 04/08/2023, 11:19 AM

## 2023-04-08 NOTE — TOC Initial Note (Signed)
Transition of Care [facility]) - Initial/Assessment Note    Patient Details  Name: Kathleen [name] MRN: [id number] Date of Birth: 1961-08-09  Transition of [facility]) CM/SW Contact:    Karn Cassis, LCSW Phone Number: 04/08/2023, 10:47 AM  Clinical Narrative:  Pt admitted for COPD exacerbation. Assessment completed due to high risk readmission score. Pt lives with her husband. Husband reports pt has been non-weight bearing following an ankle fracture. Pt was d/c from hospital to [facility] SN[facility] several weeks ago. Husband indicates pt only stayed one night. She has been using a scooter to get around at home. Pt has 4L home O2 ([employer]). Husband reports pt will return home when medically stable. He is agreeable to HHPT and requests [facility]. Referred and accepted by [facility] with [facility]. Haley notified pt is currently non-weight bearing. TOC to follow.                   Expected Discharge Plan: Home/Self Care Barriers to Discharge: Continued Medical Work up   Patient Goals and CMS Choice Patient states their goals for this hospitalization and ongoing recovery are:: return home   Choice offered to / list presented to : Spouse [place] ownership interest in [facility].provided to::  (n/a)    Expected Discharge Plan and Services In-house Referral: Clinical Social Work   Post Acute Care Choice: Home Health Living arrangements for the past 2 months: Single Family Home                           HH Arranged: PT HH Agency: [facility] Date [facility] Agency Contacted: 04/08/23 Time HH Agency Contacted: 1045 Representative spoke with at [facility] Agency: Rolly Salter  Prior Living Arrangements/Services Living arrangements for the past 2 months: Single Family Home Lives with:: Spouse Patient language and need for interpreter reviewed:: Yes Do you feel safe going back to the place where you live?: Yes      Need for Family Participation in Patient Care: Yes (Comment)   Current home  services: DME (cane, walker, home O2, scooter, wheelchair) Criminal Activity/Legal Involvement Pertinent to Current Situation/Hospitalization: No - Comment as needed  Activities of Daily Living Home Assistive Devices/Equipment: Environmental consultant (specify type), Shower chair with back, Tub transfer bench ADL Screening (condition at time of admission) Patient's cognitive ability adequate to safely complete daily activities?: Yes Is the patient deaf or have difficulty hearing?: No Does the patient have difficulty seeing, even when wearing glasses/contacts?: No Does the patient have difficulty concentrating, remembering, or making decisions?: No Patient able to express need for assistance with ADLs?: Yes Does the patient have difficulty dressing or bathing?: Yes Independently performs ADLs?: No Communication: Independent Dressing (OT): Needs assistance Is this a change from baseline?: Pre-admission baseline Grooming: Independent Feeding: Independent Bathing: Needs assistance Is this a change from baseline?: Pre-admission baseline Toileting: Needs assistance Is this a change from baseline?: Change from baseline, expected to last >3days In/Out Bed: Needs assistance Is this a change from baseline?: Change from baseline, expected to last >3 days Walks in Home: Dependent Is this a change from baseline?: Pre-admission baseline Does the patient have difficulty walking or climbing stairs?: Yes Weakness of Legs: Left Weakness of Arms/Hands: None  Permission Sought/Granted                  Emotional Assessment         Alcohol / Substance Use: Not Applicable Psych Involvement: No (comment)  Admission diagnosis:  COPD exacerbation (HCC) [J44.1] Acute respiratory failure, unspecified whether with hypoxia or hypercapnia (HCC) [J96.00] COPD with acute exacerbation (HCC) [J44.1] Patient Active Problem List   Diagnosis Date Noted   COPD exacerbation (HCC) 04/07/2023   COPD with acute exacerbation  (HCC) 04/07/2023   Ankle fracture, bimalleolar, closed, left, sequela 03/10/2023   Opioid dependence (HCC) 03/10/2023   Closed fracture of left ankle 03/10/2023   Syndesmotic disruption of ankle, left, initial encounter 03/10/2023   Lumbar transverse process fracture (HCC) 01/02/2023   Acute on chronic hypoxic respiratory failure (HCC) 12/13/2022   Depression, major, single episode, moderate (HCC) 09/10/2022   Aortic atherosclerosis (HCC) 09/10/2022   Chronic respiratory failure with hypoxia (HCC) 05/22/2021   Anxiety and depression 05/22/2021   Vitamin D deficiency 05/09/2021   Left foot drop 01/09/2021   Pulmonary HTN (HCC) 10/12/2020   Encounter for long-term opiate analgesic use 02/02/2020   Interstitial lung disease (HCC) 04/14/2019   GERD (gastroesophageal reflux disease) 10/15/2017   Personal history of noncompliance with medical treatment, presenting hazards to health 04/26/2017   Hypothyroidism 01/15/2017   Adrenal insufficiency (Addison's disease) (HCC) 08/22/2016   Symptomatic bradycardia    Hypokalemia 08/17/2016   Chronic pain syndrome 04/20/2016   Irritable bowel syndrome with constipation 11/14/2015   Avascular necrosis of bone of hip, left (HCC) 04/05/2015   Post herpetic neuralgia 10/22/2014   Mixed hyperlipidemia 08/25/2014   Obstructive sleep apnea 02/18/2014   Class 2 severe obesity due to excess calories with serious comorbidity and body mass index (BMI) of 38.0 to 38.9 in adult [facility]) 05/06/2013   Fibromyalgia 02/11/2013   Hypotension 01/16/2013   Fibrocystic breast disease in female 04/09/2012   Sarcoidosis 06/21/2008   PCP:  [name], MD Pharmacy:   [facility] [phone] - [place], [place] - [address] AT [facility] OF [address] & [address] [place] [address] Phone: [phone] Fax: [phone]  [place] - [facility] [address] [place] [address] Phone: [phone] Fax:  [phone]  [place] - [facility] [address] [place] [address] Phone: [phone] Fax: [phone]     Social Determinants of Health (SDOH) Social History: SDOH Screenings   Food Insecurity: No Food Insecurity (04/07/2023)  Housing: Low Risk  (04/07/2023)  Transportation Needs: No Transportation Needs (04/07/2023)  Utilities: Not At Risk (04/07/2023)  Alcohol Screen: Low Risk  (09/21/2022)  Depression (PHQ2-9): Low Risk  (03/21/2023)  Financial Resource Strain: Low Risk  (09/21/2022)  Physical Activity: Inactive (09/21/2022)  Social Connections: Socially Integrated (09/21/2022)  Stress: No Stress Concern Present (01/14/2023)  Tobacco Use: Low Risk  (04/07/2023)   SDOH Interventions:     Readmission Risk Interventions    04/08/2023   10:38 AM  Readmission Risk Prevention Plan  Transportation Screening Complete  Medication Review (RN Care Manager) Complete  HRI or Home Care Consult Complete  SW Recovery Care/Counseling Consult Complete  Palliative Care Screening Not Applicable  Skilled Nursing Facility Not Applicable

## 2023-04-09 DIAGNOSIS — J441 Chronic obstructive pulmonary disease with (acute) exacerbation: Secondary | ICD-10-CM | POA: Diagnosis not present

## 2023-04-09 MED ORDER — LIDOCAINE 5 % EX PTCH
1.0000 | MEDICATED_PATCH | CUTANEOUS | Status: DC
  Administered 2023-04-09: 1 via TRANSDERMAL
  Filled 2023-04-09: qty 1

## 2023-04-09 MED ORDER — LIDOCAINE 5 % EX PTCH
1.0000 | MEDICATED_PATCH | CUTANEOUS | 0 refills | Status: DC
Start: 2023-04-09 — End: 2024-02-25
  Filled 2023-04-10: qty 30, 30d supply, fill #0
  Filled 2023-04-10: qty 5, 5d supply, fill #0
  Filled 2023-08-01: qty 5, 5d supply, fill #1
  Filled 2023-10-15: qty 10, 10d supply, fill #2
  Filled 2023-10-24: qty 10, 10d supply, fill #3

## 2023-04-09 MED ORDER — HYDROCORTISONE 10 MG PO TABS
10.0000 mg | ORAL_TABLET | Freq: Every day | ORAL | Status: DC
  Administered 2023-04-09: 10 mg via ORAL
  Filled 2023-04-09 (×2): qty 1

## 2023-04-09 MED ORDER — HYDROCORTISONE 20 MG PO TABS
20.0000 mg | ORAL_TABLET | Freq: Every day | ORAL | Status: DC
  Administered 2023-04-09: 20 mg via ORAL
  Filled 2023-04-09 (×2): qty 2

## 2023-04-09 MED ORDER — PREDNISONE 20 MG PO TABS
40.0000 mg | ORAL_TABLET | Freq: Every day | ORAL | 0 refills | Status: AC
  Filled 2023-04-10: qty 10, 5d supply, fill #0

## 2023-04-09 NOTE — TOC Transition Note (Signed)
Transition of Care [facility]) - CM/SW Discharge Note   Patient Details  Name: Kathleen [name] MRN: [id number] Date of Birth: 12-03-1960  [facility] CM/SW Contact:  Karn Cassis, LCSW Phone Number: 04/09/2023, 3:05 PM   Clinical Narrative: Pt d/c today. Haley with [place] notified. HHPT order in.       Final next level of care: Home w Home Health Services Barriers to Discharge: Barriers Resolved   Patient Goals and CMS Choice   Choice offered to / list presented to : Spouse  Discharge Placement                         Discharge Plan and Services Additional resources added to the After Visit Summary for   In-house Referral: Clinical Social Work   Post Acute Care Choice: Home Health                    HH Arranged: PT [facility] Agency: [facility] Date [facility] Agency Contacted: 04/08/23 Time HH Agency Contacted: 1045 Representative spoke with at [facility] Agency: Rolly Salter  Social Determinants of Health (SDOH) Interventions SDOH Screenings   Food Insecurity: No Food Insecurity (04/07/2023)  Housing: Low Risk  (04/07/2023)  Transportation Needs: No Transportation Needs (04/07/2023)  Utilities: Not At Risk (04/07/2023)  Alcohol Screen: Low Risk  (09/21/2022)  Depression (PHQ2-9): Low Risk  (03/21/2023)  Financial Resource Strain: Low Risk  (09/21/2022)  Physical Activity: Inactive (09/21/2022)  Social Connections: Socially Integrated (09/21/2022)  Stress: No Stress Concern Present (01/14/2023)  Tobacco Use: Low Risk  (04/07/2023)     Readmission Risk Interventions    04/08/2023   10:38 AM  Readmission Risk Prevention Plan  Transportation Screening Complete  Medication Review (RN Care Manager) Complete  HRI or Home Care Consult Complete  SW Recovery Care/Counseling Consult Complete  Palliative Care Screening Not Applicable  Skilled Nursing Facility Not Applicable

## 2023-04-09 NOTE — Progress Notes (Signed)
PROGRESS NOTE    Kathleen [name]  ZOX:[id number] DOB: Nov 29, 1960 DOA: 04/06/2023 PCP: [name], MD   Brief Narrative:    Kathleen [name] is a 62 y.o. female with medical history significant for COPD, pulmonary sarcoidosis, interstitial lung disease, chronic pain, depression, anxiety, hypothyroidism, and adrenal insufficiency presents to the emergency department with shortness of breath and chest pain.  She was admitted for acute COPD exacerbation in the setting of chronic hypoxemic respiratory failure and started on IV steroids and breathing treatments as well as Rocephin.   Assessment & Plan:   Principal Problem:   COPD exacerbation (HCC) Active Problems:   Interstitial lung disease (HCC)   Chronic respiratory failure with hypoxia (HCC)   Anxiety and depression   Sarcoidosis   Fibromyalgia   Chronic pain syndrome   COPD with acute exacerbation (HCC)  Assessment and Plan:  1. COPD exacerbation; chronic hypoxic respiratory failure; pulmonary sarcoidosis  - Culture sputum, continue systemic steroids, start antibiotic, schedule DuoNebs, use additional albuterol as needed, continue supplemental O2     2. Chest pain  - Atypical, unchanged with NTG   - No acute ischemic changes noted on EKG, no acute findings on CXR, and initial troponin normal  - CTA chest negative for PE earlier this week - Continue cardiac monitoring, repeat troponin, continue ASA and Crestor     3. Depression, anxiety  - Continue Cymbalta, Buspar, and as-needed hydroxyzine  -Trazodone bedtime to assist with sleep -Continues to have some anxiety/panic attacks   4. Chronic pain  - Prescription database reviewed  - Continue Cymbalta, continue Percocet as-needed  -Enema for constipation given 8/12 with bowel movement noted   5. Adrenal insufficiency  - Not in crisis, currently on increased steroids for COPD exacerbation  -Patient demands reinitiation of hydrocortisone which has been restarted 8/13  6.  Obesity -BMI 37.59   DVT prophylaxis:Lovenox Code Status: Full Family Communication: None at bedside Disposition Plan:  Status is: Inpatient Remains inpatient appropriate because: Need for IV medications  Consultants:  None  Procedures:  None  Antimicrobials:  Anti-infectives (From admission, onward)    Start     Dose/Rate Route Frequency Ordered Stop   04/07/23 0315  cefTRIAXone (ROCEPHIN) 1 g in sodium chloride 0.9 % 100 mL IVPB        1 g 200 mL/hr over 30 Minutes Intravenous Every 24 hours 04/07/23 0301 04/12/23 0314      Subjective: Patient seen and evaluated today with minimal improvement in her symptoms.  She did have a bowel movement yesterday with the use of enema.  She is demanding initiation of her home hydrocortisone and continues to have anxiety/panic attacks.  Shortness of breath appears to be objectively improving, but patient continues to complain of ongoing wheezing and cough.  Objective: Vitals:   04/08/23 1959 04/09/23 0322 04/09/23 0822 04/09/23 0859  BP:  129/74 (!) 147/81   Pulse:  73 84   Resp:  18 19   Temp:  98.2 F (36.8 C) 98.5 F (36.9 C)   TempSrc:   Oral   SpO2: 99% 99% 100% 100%  Weight:      Height:        Intake/Output Summary (Last 24 hours) at 04/09/2023 1243 Last data filed at 04/09/2023 0946 Gross per 24 hour  Intake 680 ml  Output --  Net 680 ml   Filed Weights   04/06/23 2243  Weight: 108.9 kg    Examination:  General exam: Appears calm and comfortable  Respiratory system: Clear to auscultation. Respiratory effort normal.  Nasal cannula oxygen Cardiovascular system: S1 & S2 heard, RRR.  Gastrointestinal system: Abdomen is soft Central nervous system: Alert and awake Extremities: No edema Skin: No significant lesions noted Psychiatry: Flat affect.    Data Reviewed: I have personally reviewed following labs and imaging studies  CBC: Recent Labs  Lab 04/06/23 2249 04/07/23 0448 04/08/23 0405 04/09/23 0455   WBC 13.1* 14.8* 12.3* 9.6  HGB 10.2* 10.1* 9.7* 9.5*  HCT 34.2* 34.4* 32.5* 31.0*  MCV 93.2 94.5 94.2 92.8  PLT 462* 473* 443* 439*   Basic Metabolic Panel: Recent Labs  Lab 04/06/23 2249 04/07/23 0448 04/08/23 0405 04/09/23 0455  NA 137 139 134* 136  K 4.4 4.3 4.5 3.7  CL 99 97* 97* 98  CO2 29 26 28 31   GLUCOSE 110* 192* 182* 131*  BUN 16 17 16 17   CREATININE 0.77 0.85 0.71 0.78  CALCIUM 8.6* 9.0 8.8* 8.4*  MG  --   --  2.4 2.4   GFR: Estimated Creatinine Clearance: 95 mL/min (by C-G formula based on SCr of 0.78 mg/dL). Liver Function Tests: No results for input(s): "AST", "ALT", "ALKPHOS", "BILITOT", "PROT", "ALBUMIN" in the last 168 hours.  No results for input(s): "LIPASE", "AMYLASE" in the last 168 hours. No results for input(s): "AMMONIA" in the last 168 hours. Coagulation Profile: No results for input(s): "INR", "PROTIME" in the last 168 hours. Cardiac Enzymes: No results for input(s): "CKTOTAL", "CKMB", "CKMBINDEX", "TROPONINI" in the last 168 hours. BNP (last 3 results) No results for input(s): "PROBNP" in the last 8760 hours. HbA1C: No results for input(s): "HGBA1C" in the last 72 hours. CBG: No results for input(s): "GLUCAP" in the last 168 hours. Lipid Profile: No results for input(s): "CHOL", "HDL", "LDLCALC", "TRIG", "CHOLHDL", "LDLDIRECT" in the last 72 hours. Thyroid Function Tests: No results for input(s): "TSH", "T4TOTAL", "FREET4", "T3FREE", "THYROIDAB" in the last 72 hours. Anemia Panel: No results for input(s): "VITAMINB12", "FOLATE", "FERRITIN", "TIBC", "IRON", "RETICCTPCT" in the last 72 hours. Sepsis Labs: No results for input(s): "PROCALCITON", "LATICACIDVEN" in the last 168 hours.  Recent Results (from the past 240 hour(s))  Resp panel by RT-PCR (RSV, Flu A&B, Covid) Anterior Nasal Swab     Status: None   Collection Time: 04/06/23 10:49 PM   Specimen: Anterior Nasal Swab  Result Value Ref Range Status   SARS Coronavirus 2 by RT PCR  NEGATIVE NEGATIVE Final    Comment: (NOTE) SARS-CoV-2 target nucleic acids are NOT DETECTED.  The SARS-CoV-2 RNA is generally detectable in upper respiratory specimens during the acute phase of infection. The lowest concentration of SARS-CoV-2 viral copies this assay can detect is 138 copies/mL. A negative result does not preclude SARS-Cov-2 infection and should not be used as the sole basis for treatment or other patient management decisions. A negative result may occur with  improper specimen collection/handling, submission of specimen other than nasopharyngeal swab, presence of viral mutation(s) within the areas targeted by this assay, and inadequate number of viral copies(<138 copies/mL). A negative result must be combined with clinical observations, patient history, and epidemiological information. The expected result is Negative.  Fact Sheet for Patients:  BloggerCourse.com  Fact Sheet for Healthcare Providers:  SeriousBroker.it  This test is no t yet approved or cleared by the [place] FDA and  has been authorized for detection and/or diagnosis of SARS-CoV-2 by FDA under an Emergency Use Authorization (EUA). This EUA will remain  in effect (meaning this test can be  used) for the duration of the COVID-19 declaration under Section 564(b)(1) of the Act, 21 U.S.C.section 360bbb-3(b)(1), unless the authorization is terminated  or revoked sooner.       Influenza A by PCR NEGATIVE NEGATIVE Final   Influenza B by PCR NEGATIVE NEGATIVE Final    Comment: (NOTE) The Xpert Xpress SARS-CoV-2/FLU/RSV plus assay is intended as an aid in the diagnosis of influenza from Nasopharyngeal swab specimens and should not be used as a sole basis for treatment. Nasal washings and aspirates are unacceptable for Xpert Xpress SARS-CoV-2/FLU/RSV testing.  Fact Sheet for Patients: BloggerCourse.com  Fact Sheet for  Healthcare Providers: SeriousBroker.it  This test is not yet approved or cleared by the [place] FDA and has been authorized for detection and/or diagnosis of SARS-CoV-2 by FDA under an Emergency Use Authorization (EUA). This EUA will remain in effect (meaning this test can be used) for the duration of the COVID-19 declaration under Section 564(b)(1) of the Act, 21 U.S.C. section 360bbb-3(b)(1), unless the authorization is terminated or revoked.     Resp Syncytial Virus by PCR NEGATIVE NEGATIVE Final    Comment: (NOTE) Fact Sheet for Patients: BloggerCourse.com  Fact Sheet for Healthcare Providers: SeriousBroker.it  This test is not yet approved or cleared by the [place] FDA and has been authorized for detection and/or diagnosis of SARS-CoV-2 by FDA under an Emergency Use Authorization (EUA). This EUA will remain in effect (meaning this test can be used) for the duration of the COVID-19 declaration under Section 564(b)(1) of the Act, 21 U.S.C. section 360bbb-3(b)(1), unless the authorization is terminated or revoked.  Performed at [facility], [address] [place], [place] [address]          Radiology Studies: No results found.      Scheduled Meds:  aspirin EC  81 mg Oral BID   busPIRone  15 mg Oral TID   DULoxetine  60 mg Oral Daily   enoxaparin (LOVENOX) injection  40 mg Subcutaneous Q24H   hydrocortisone  10 mg Oral Q lunch   hydrocortisone  20 mg Oral Q breakfast   ipratropium-albuterol  3 mL Nebulization Q6H   levothyroxine  50 mcg Oral Q0600   pantoprazole  40 mg Oral BID AC   predniSONE  40 mg Oral Q breakfast   rosuvastatin  10 mg Oral Daily   sodium chloride flush  3 mL Intravenous Q12H   traZODone  50 mg Oral QHS   Continuous Infusions:  cefTRIAXone (ROCEPHIN)  IV 1 g (04/09/23 0326)     LOS: 2 days    Time spent: 35 minutes     Hoover Brunette, DO [facility]  If 7PM-7AM, please contact night-coverage [email or web address] 04/09/2023, 12:43 PM

## 2023-04-09 NOTE — Progress Notes (Signed)
Nurse at bedside,patient asking for stronger pain medication, Dr Sherryll Burger notified. Patient offered a heat patch,patient refused.Plan of care on going.

## 2023-04-09 NOTE — Progress Notes (Signed)
Patient discharged with instructions given on medications and follow up visits,patient verbalized understanding. Prescriptions sent to Pharmacy of choice documented on AVS. IV discontinued, catheter intact. Staff to accompany patient to awaiting vehicle.

## 2023-04-09 NOTE — Consult Note (Signed)
   [facility] CM Inpatient Consult   04/09/2023  Kathleen [name] 02/26/1961 [id number]  Primary Care Provider:    Patient is currently active with Care Management for chronic disease management services.  Patient has been engaged by a care coordinator.  Our community based plan of care has focused on disease management and community resource support.   Patient will receive a post hospital call and will be evaluated for assessments and disease process education.   Plan: Pt will discharge with [facility] services on 04/10/2023  Inpatient Transition Of Care [TOC] team member to make aware that Care Management following.  Of note, Care Management services does not replace or interfere with any services that are needed or arranged by inpatient [facility] care management team.   For additional questions or referrals please contact:    Elliot Cousin, RN, [facility] Liaison [place]   Population Health Office Hours MTWF  8:00 am-6:00 pm Off on Thursday [phone] mobile [phone] [Office toll free line] Office Hours are M-F 8:30 - 5 pm [email or web address]

## 2023-04-09 NOTE — Discharge Summary (Signed)
Physician Discharge Summary  Kathleen [name] ZOX:[id number] DOB: 1961/06/20 DOA: 04/06/2023  PCP: [name], MD  Admit date: 04/06/2023  Discharge date: 04/09/2023  Admitted From:Home  Disposition:  Home  Recommendations for Outpatient Follow-up:  Follow up with PCP in 1-2 weeks Continue on prednisone as prescribed for total 5 days course Continue home breathing treatments as needed Lidoderm patch prescribed for back pain as needed Continue other home anxiolytics and pain medications as previously prescribed  Home Health: Yes with PT  Equipment/Devices: Has home 4 L nasal cannula oxygen  Discharge Condition:Stable  CODE STATUS: Full  Diet recommendation: Heart Healthy  Brief/Interim Summary: Kathleen [name] is a 62 y.o. female with medical history significant for COPD, pulmonary sarcoidosis, interstitial lung disease, chronic pain, depression, anxiety, hypothyroidism, and adrenal insufficiency presents to the emergency department with shortness of breath and chest pain.  She was admitted for acute COPD exacerbation in the setting of chronic hypoxemic respiratory failure and started on IV steroids and breathing treatments as well as Rocephin.  She was noted to have multiple panic attacks during the hospitalization as well as significant anxiety and overall chronic pain, but she is now in stable condition for discharge as her COPD has improved considerably.  No other acute events or concerns noted throughout the course of this admission.  Discharge Diagnoses:  Principal Problem:   COPD exacerbation (HCC) Active Problems:   Interstitial lung disease (HCC)   Chronic respiratory failure with hypoxia (HCC)   Anxiety and depression   Sarcoidosis   Fibromyalgia   Chronic pain syndrome   COPD with acute exacerbation (HCC)  Principal discharge diagnosis: Acute COPD exacerbation in the setting of chronic hypoxemic respiratory failure.  Discharge Instructions  Discharge  Instructions     Diet - low sodium heart healthy   Complete by: As directed    Increase activity slowly   Complete by: As directed       Allergies as of 04/09/2023       Reactions   Bee Venom Anaphylaxis   Contrast Media [iodinated Contrast Media]    CT chest w/ contrast at OSH followed by SOB resolved w/ single dose steroids, never ENT swelling, intubation or pressors, has had multiple contrasted scans since   Bactrim [sulfamethoxazole-trimethoprim] Nausea And Vomiting   Codeine Itching   Morphine And Codeine Nausea And Vomiting, Other (See Comments)   Dizziness  Confusion        Medication List     TAKE these medications    albuterol 108 (90 Base) MCG/ACT inhaler Commonly known as: VENTOLIN HFA Inhale 2 puffs into the lungs every 6 (six) hours as needed for wheezing or shortness of breath.   albuterol (2.5 MG/3ML) 0.083% nebulizer solution Commonly known as: PROVENTIL Take 3 mLs (2.5 mg total) by nebulization every 4 (four) hours as needed for wheezing or shortness of breath.   aspirin EC 81 MG tablet Take 1 tablet (81 mg total) by mouth 2 (two) times daily. Swallow whole. What changed: when to take this   budesonide 0.5 MG/2ML nebulizer solution Commonly known as: PULMICORT Inhale the contents of 1 vial via nebulizer two (2) times a day.   busPIRone 15 MG tablet Commonly known as: BUSPAR Take 1 tablet (15 mg) by mouth 3 times daily.   calcitonin (salmon) 200 UNIT/ACT nasal spray Commonly known as: Miacalcin Use 1 spray in one nostril per day, alternate nostrils every other day (may have 1 vial she should use this for at least 2  weeks but may use as long as 4 weeks)   DULoxetine 60 MG capsule Commonly known as: Cymbalta Take 1 capsule (60 mg) by mouth daily.   EPINEPHrine 0.3 mg/0.3 mL Soaj injection Commonly known as: EPI-PEN Inject 0.3 mg into the muscle as needed for anaphylaxis.   ferrous sulfate 325 (65 FE) MG EC tablet Take 325 mg by mouth once a  week.   hydrocortisone 10 MG tablet Commonly known as: CORTEF Take 2 tablets by mouth at 8:00 am and 1 tablet at lunch. (May double dose on sick days x 3-5 days)   hydrOXYzine 25 MG tablet Commonly known as: ATARAX Take 1 tablet (25 mg total) by mouth 3 (three) times daily as needed for anxiety.   ipratropium-albuterol 0.5-2.5 (3) MG/3ML Soln Commonly known as: DUONEB Inhale 1 vial via nebulizer every 8 hours.   levothyroxine 50 MCG tablet Commonly known as: SYNTHROID Take 1 tablet by mouth daily before breakfast.   lidocaine 5 % Commonly known as: LIDODERM Place 1 patch onto the skin daily. Remove & Discard patch within 12 hours or as directed by MD   Linzess 290 MCG Caps capsule Generic drug: linaclotide Take 1 capsule (290 mcg total) by mouth daily before breakfast.   naloxone 4 MG/0.1ML Liqd nasal spray kit Commonly known as: NARCAN Use as directed for opioid overdose   ondansetron 8 MG disintegrating tablet Commonly known as: ZOFRAN-ODT Take 8 mg by mouth every 8 (eight) hours as needed for nausea or vomiting.   oxyCODONE-acetaminophen 10-325 MG tablet Commonly known as: PERCOCET Take 1 tablet by mouth every 4 (four) hours as needed for pain maximum 6/day not with any other form of oxycodone   pantoprazole 40 MG tablet Commonly known as: PROTONIX Take 1 tablet (40 mg total) by mouth 2 (two) times daily before a meal.   predniSONE 5 MG tablet Commonly known as: DELTASONE Take 1 tablet (5 mg total) by mouth in the morning. What changed: Another medication with the same name was added. Make sure you understand how and when to take each.   predniSONE 20 MG tablet Commonly known as: DELTASONE Take 2 tablets (40 mg total) by mouth daily with breakfast for 5 days. Start taking on: April 10, 2023 What changed: You were already taking a medication with the same name, and this prescription was added. Make sure you understand how and when to take each.   rosuvastatin  10 MG tablet Commonly known as: Crestor Take 1 tablet (10 mg total) by mouth daily.   senna-docusate 8.6-50 MG tablet Commonly known as: Senokot-S Take 1 tablet by mouth 2 (two) times daily between meals as needed for mild constipation.        Follow-up Information     [facility] Follow up.   Why: Will contact you to schedule home health visits.        [name], MD. Schedule an appointment as soon as possible for a visit in 1 week(s).   Specialty: Family Medicine Contact information: [address] [place] [address] [phone]                Allergies  Allergen Reactions   Bee Venom Anaphylaxis   Contrast Media [Iodinated Contrast Media]     CT chest w/ contrast at OSH followed by SOB resolved w/ single dose steroids, never ENT swelling, intubation or pressors, has had multiple contrasted scans since   Bactrim [Sulfamethoxazole-Trimethoprim] Nausea And Vomiting   Codeine Itching  Morphine And Codeine Nausea And Vomiting and Other (See Comments)    Dizziness  Confusion    Consultations: None   Procedures/Studies: DG Chest 2 View  Result Date: 04/06/2023 CLINICAL DATA:  Chest pain. EXAM: CHEST - 2 VIEW COMPARISON:  Chest radiograph dated 04/02/2023. FINDINGS: There is diffuse chronic intra coarsening and fibrosis. No consolidative changes. There is no pleural effusion or pneumothorax. Stable cardiac silhouette. No acute osseous pathology. IMPRESSION: 1. No acute cardiopulmonary process. 2. Chronic interstitial lung disease. Electronically Signed   By: Elgie Collard M.D.   On: 04/06/2023 23:38   CT ABDOMEN PELVIS W CONTRAST  Result Date: 04/02/2023 CLINICAL DATA:  History of pulmonary fibrosis and sarcoidosis with chest pain, shortness of breath, nausea, and vomiting following recent ankle surgery EXAM: CT ANGIOGRAPHY CHEST CT ABDOMEN AND PELVIS WITH CONTRAST TECHNIQUE: Multidetector CT imaging of the chest was performed using  the standard protocol during bolus administration of intravenous contrast. Multiplanar CT image reconstructions and MIPs were obtained to evaluate the vascular anatomy. Multidetector CT imaging of the abdomen and pelvis was performed using the standard protocol during bolus administration of intravenous contrast. RADIATION DOSE REDUCTION: This exam was performed according to the departmental dose-optimization program which includes automated exposure control, adjustment of the mA and/or kV according to patient size and/or use of iterative reconstruction technique. CONTRAST:  OMNIPAQUE IOHEXOL 350 MG/ML SOLN COMPARISON:  CTA chest dated 02/26/2023, CT chest, abdomen, and pelvis dated 12/18/2022 FINDINGS: CTA CHEST FINDINGS Cardiovascular: The study is adequate for the evaluation of pulmonary embolism. There are no filling defects in the central, lobar, segmental or proximal subsegmental pulmonary artery branches to suggest acute pulmonary embolism. Great vessels are normal in course and caliber. Normal heart size. No significant pericardial fluid/thickening. Mediastinum/Nodes: Imaged thyroid gland without nodules meeting criteria for imaging follow-up by size. Normal esophagus. Unchanged 1.1 cm right paratracheal lymph node (6:30). Lungs/Pleura: The central airways are patent. Similar bilateral posterior segment upper lobe bronchiectasis and masslike consolidation/scarring. No new focal consolidation. No pneumothorax. No pleural effusion. Musculoskeletal: Slightly increased superior endplate compression deformity of T11. Review of the MIP images confirms the above findings. CT ABDOMEN and PELVIS FINDINGS Hepatobiliary: No focal hepatic lesions. No intra or extrahepatic biliary ductal dilation. Cholecystectomy. Pancreas: No focal lesions or main ductal dilation. Spleen: Normal in size without focal abnormality. Adrenals/Urinary Tract: No adrenal nodules. No calculi or hydronephrosis. 1.5 cm right interpolar  hypodensity (1:46), likely cyst No specific follow-up imaging recommended. No focal bladder wall thickening. Stomach/Bowel: Normal appearance of the stomach. No evidence of bowel wall thickening, distention, or inflammatory changes. Appendix is not seen. Vascular/Lymphatic: Aortic atherosclerosis. No enlarged abdominal or pelvic lymph nodes. Reproductive: No adnexal masses. Other: No free fluid, fluid collection, or free air. Musculoskeletal: Healing fracture of the left parasymphyseal pubic ramus and right sacral ala. IMPRESSION: 1. No evidence of pulmonary embolism. 2. No acute infectious/inflammatory findings in the abdomen or pelvis. 3. Similar findings of pulmonary fibrosis. 4. Slightly increased superior endplate compression deformity of T11. 5. Healing fracture of the left parasymphyseal pubic ramus and right sacral ala. 6.  Aortic Atherosclerosis (ICD10-I70.0). Electronically Signed   By: Agustin Cree M.D.   On: 04/02/2023 08:59   CT Angio Chest Pulmonary Embolism (PE) W or WO Contrast  Result Date: 04/02/2023 CLINICAL DATA:  History of pulmonary fibrosis and sarcoidosis with chest pain, shortness of breath, nausea, and vomiting following recent ankle surgery EXAM: CT ANGIOGRAPHY CHEST CT ABDOMEN AND PELVIS WITH CONTRAST TECHNIQUE: Multidetector CT imaging  of the chest was performed using the standard protocol during bolus administration of intravenous contrast. Multiplanar CT image reconstructions and MIPs were obtained to evaluate the vascular anatomy. Multidetector CT imaging of the abdomen and pelvis was performed using the standard protocol during bolus administration of intravenous contrast. RADIATION DOSE REDUCTION: This exam was performed according to the departmental dose-optimization program which includes automated exposure control, adjustment of the mA and/or kV according to patient size and/or use of iterative reconstruction technique. CONTRAST:  OMNIPAQUE IOHEXOL 350 MG/ML SOLN  COMPARISON:  CTA chest dated 02/26/2023, CT chest, abdomen, and pelvis dated 12/18/2022 FINDINGS: CTA CHEST FINDINGS Cardiovascular: The study is adequate for the evaluation of pulmonary embolism. There are no filling defects in the central, lobar, segmental or proximal subsegmental pulmonary artery branches to suggest acute pulmonary embolism. Great vessels are normal in course and caliber. Normal heart size. No significant pericardial fluid/thickening. Mediastinum/Nodes: Imaged thyroid gland without nodules meeting criteria for imaging follow-up by size. Normal esophagus. Unchanged 1.1 cm right paratracheal lymph node (6:30). Lungs/Pleura: The central airways are patent. Similar bilateral posterior segment upper lobe bronchiectasis and masslike consolidation/scarring. No new focal consolidation. No pneumothorax. No pleural effusion. Musculoskeletal: Slightly increased superior endplate compression deformity of T11. Review of the MIP images confirms the above findings. CT ABDOMEN and PELVIS FINDINGS Hepatobiliary: No focal hepatic lesions. No intra or extrahepatic biliary ductal dilation. Cholecystectomy. Pancreas: No focal lesions or main ductal dilation. Spleen: Normal in size without focal abnormality. Adrenals/Urinary Tract: No adrenal nodules. No calculi or hydronephrosis. 1.5 cm right interpolar hypodensity (1:46), likely cyst No specific follow-up imaging recommended. No focal bladder wall thickening. Stomach/Bowel: Normal appearance of the stomach. No evidence of bowel wall thickening, distention, or inflammatory changes. Appendix is not seen. Vascular/Lymphatic: Aortic atherosclerosis. No enlarged abdominal or pelvic lymph nodes. Reproductive: No adnexal masses. Other: No free fluid, fluid collection, or free air. Musculoskeletal: Healing fracture of the left parasymphyseal pubic ramus and right sacral ala. IMPRESSION: 1. No evidence of pulmonary embolism. 2. No acute infectious/inflammatory findings in  the abdomen or pelvis. 3. Similar findings of pulmonary fibrosis. 4. Slightly increased superior endplate compression deformity of T11. 5. Healing fracture of the left parasymphyseal pubic ramus and right sacral ala. 6.  Aortic Atherosclerosis (ICD10-I70.0). Electronically Signed   By: Agustin Cree M.D.   On: 04/02/2023 08:59   DG Chest Port 1 View  Result Date: 04/02/2023 CLINICAL DATA:  Shortness of breath and chest pain. History of sarcoidosis. EXAM: PORTABLE CHEST 1 VIEW COMPARISON:  Portable chest 03/10/2023 FINDINGS: The heart is slightly enlarged. There is a stable mediastinal configuration with aortic atherosclerosis and mild tortuosity, and mediastinal lipomatosis. Paramediastinal fibrosis is again noted and seems unchanged. There are mild emphysematous changes in the apices. There is questionable increased right mid to lower perihilar haziness versus hypoventilatory change. No other new opacity is seen in the lung fields. The sulci are sharp. No new osseous findings. IMPRESSION: 1. Questionable increased right mid to lower perihilar haziness versus hypoventilatory change. No other new opacity is seen. 2. Mediastinal lipomatosis and aortic atherosclerosis. 3. Emphysema. Electronically Signed   By: Almira Bar M.D.   On: 04/02/2023 04:41   XR Ankle Complete Left  Result Date: 03/29/2023 X-rays of the left ankle taken 03/29/2023 were independently reviewed and interpreted, showing minimal interval displacement of her medial malleolus and lateral malleolus fractures with slight lateral translation of both.  The talus appears to be slightly translated laterally when compared to her immediate postoperative films.  No lucency is seen around the screws.  Screws have not backed out.  No new fractures seen.   MR THORACIC SPINE WO CONTRAST  Result Date: 03/11/2023 CLINICAL DATA:  Compression fracture, thoracic. EXAM: MRI THORACIC SPINE WITHOUT CONTRAST TECHNIQUE: Multiplanar, multisequence MR imaging of the  thoracic spine was performed. No intravenous contrast was administered. COMPARISON:  CT chest February 26, 2023. FINDINGS: Alignment:  Mild levoconvex curvature. Vertebrae: Compression fracture of the superior endplate of T11 with associated marrow edema, consistent with a subacute fracture. There is approximately 25% vertebral body height loss. No significant retropulsion. No evidence of discitis, or aggressive bone lesion. Cord: Evaluation is somewhat limited by motion artifacts. No gross cord signal abnormality identified. Paraspinal and other soft tissues: Negative. Disc levels: Epidural lipomatosis causes mild narrowing of the thecal sac along the thoracic spine, particularly in the upper thoracic region. No significant disc bulge or herniation, osseous spinal canal or neural foraminal stenosis. IMPRESSION: 1. Subacute compression fracture of the superior endplate of T11 with approximately 25% vertebral body height loss. No significant retropulsion. 2. No high-grade spinal canal or neural foraminal stenosis. Electronically Signed   By: Baldemar Lenis M.D.   On: 03/11/2023 13:30   DG Ankle Complete Left  Result Date: 03/10/2023 CLINICAL DATA:  Postop EXAM: LEFT ANKLE COMPLETE - 3+ VIEW COMPARISON:  03/10/2023 FINDINGS: Casting material limits osseous detail. Interval surgical plate and multiple screw fixation of the distal fibula with additional screw fixation medial malleolus of the tibia. There is anatomic alignment. Reduction of previously noted lateral talar subluxation. IMPRESSION: Interval ORIF of distal fibular and medial malleolar fractures with anatomic alignment. Electronically Signed   By: Jasmine Pang M.D.   On: 03/10/2023 22:08   DG Ankle Complete Left  Result Date: 03/10/2023 CLINICAL DATA:  Open reduction internal fixation of ankle fracture EXAM: LEFT ANKLE COMPLETE - 3+ VIEW COMPARISON:  Left ankle x-ray 03/10/2023 FINDINGS: Three intraoperative fluoroscopic views of the left  ankle. Lateral sideplate and screws and medial malleolar screws are present fixating medial malleolar and distal fibular fractures. Alignment is anatomic. There is a nondisplaced posterior malleolar fracture. There is soft tissue swelling surrounding the ankle. Fluoroscopy time: 34.7 seconds.  Fluoroscopy dose 0.41 micro gray. IMPRESSION: 1. Open reduction internal fixation of medial malleolar and distal fibular fractures. Alignment is anatomic. 2. Nondisplaced posterior malleolar fracture. Electronically Signed   By: Darliss Cheney M.D.   On: 03/10/2023 21:27   DG C-Arm 1-60 Min-No Report  Result Date: 03/10/2023 Fluoroscopy was utilized by the requesting physician.  No radiographic interpretation.   DG C-Arm 1-60 Min-No Report  Result Date: 03/10/2023 Fluoroscopy was utilized by the requesting physician.  No radiographic interpretation.     Discharge Exam: Vitals:   04/09/23 0859 04/09/23 1337  BP:    Pulse:    Resp:    Temp:    SpO2: 100% 99%   Vitals:   04/09/23 0322 04/09/23 0822 04/09/23 0859 04/09/23 1337  BP: 129/74 (!) 147/81    Pulse: 73 84    Resp: 18 19    Temp: 98.2 F (36.8 C) 98.5 F (36.9 C)    TempSrc:  Oral    SpO2: 99% 100% 100% 99%  Weight:      Height:        General: Pt is alert, awake, not in acute distress Cardiovascular: RRR, S1/S2 +, no rubs, no gallops Respiratory: CTA bilaterally, no wheezing, no rhonchi, 4 L nasal cannula Abdominal: Soft, NT, ND, bowel  sounds + Extremities: no edema, no cyanosis    The results of significant diagnostics from this hospitalization (including imaging, microbiology, ancillary and laboratory) are listed below for reference.     Microbiology: Recent Results (from the past 240 hour(s))  Resp panel by RT-PCR (RSV, Flu A&B, Covid) Anterior Nasal Swab     Status: None   Collection Time: 04/06/23 10:49 PM   Specimen: Anterior Nasal Swab  Result Value Ref Range Status   SARS Coronavirus 2 by RT PCR NEGATIVE NEGATIVE  Final    Comment: (NOTE) SARS-CoV-2 target nucleic acids are NOT DETECTED.  The SARS-CoV-2 RNA is generally detectable in upper respiratory specimens during the acute phase of infection. The lowest concentration of SARS-CoV-2 viral copies this assay can detect is 138 copies/mL. A negative result does not preclude SARS-Cov-2 infection and should not be used as the sole basis for treatment or other patient management decisions. A negative result may occur with  improper specimen collection/handling, submission of specimen other than nasopharyngeal swab, presence of viral mutation(s) within the areas targeted by this assay, and inadequate number of viral copies(<138 copies/mL). A negative result must be combined with clinical observations, patient history, and epidemiological information. The expected result is Negative.  Fact Sheet for Patients:  BloggerCourse.com  Fact Sheet for Healthcare Providers:  SeriousBroker.it  This test is no t yet approved or cleared by the [place] FDA and  has been authorized for detection and/or diagnosis of SARS-CoV-2 by FDA under an Emergency Use Authorization (EUA). This EUA will remain  in effect (meaning this test can be used) for the duration of the COVID-19 declaration under Section 564(b)(1) of the Act, 21 U.S.C.section 360bbb-3(b)(1), unless the authorization is terminated  or revoked sooner.       Influenza A by PCR NEGATIVE NEGATIVE Final   Influenza B by PCR NEGATIVE NEGATIVE Final    Comment: (NOTE) The Xpert Xpress SARS-CoV-2/FLU/RSV plus assay is intended as an aid in the diagnosis of influenza from Nasopharyngeal swab specimens and should not be used as a sole basis for treatment. Nasal washings and aspirates are unacceptable for Xpert Xpress SARS-CoV-2/FLU/RSV testing.  Fact Sheet for Patients: BloggerCourse.com  Fact Sheet for Healthcare  Providers: SeriousBroker.it  This test is not yet approved or cleared by the [place] FDA and has been authorized for detection and/or diagnosis of SARS-CoV-2 by FDA under an Emergency Use Authorization (EUA). This EUA will remain in effect (meaning this test can be used) for the duration of the COVID-19 declaration under Section 564(b)(1) of the Act, 21 U.S.C. section 360bbb-3(b)(1), unless the authorization is terminated or revoked.     Resp Syncytial Virus by PCR NEGATIVE NEGATIVE Final    Comment: (NOTE) Fact Sheet for Patients: BloggerCourse.com  Fact Sheet for Healthcare Providers: SeriousBroker.it  This test is not yet approved or cleared by the [place] FDA and has been authorized for detection and/or diagnosis of SARS-CoV-2 by FDA under an Emergency Use Authorization (EUA). This EUA will remain in effect (meaning this test can be used) for the duration of the COVID-19 declaration under Section 564(b)(1) of the Act, 21 U.S.C. section 360bbb-3(b)(1), unless the authorization is terminated or revoked.  Performed at [facility], [address], [place], [place] [address]      Labs: BNP (last 3 results) Recent Labs    12/12/22 1753 02/25/23 1705  BNP 38.0 49.0   Basic Metabolic Panel: Recent Labs  Lab 04/06/23 2249 04/07/23 0448 04/08/23 0405 04/09/23 0455  NA  137 139 134* 136  K 4.4 4.3 4.5 3.7  CL 99 97* 97* 98  CO2 29 26 28 31   GLUCOSE 110* 192* 182* 131*  BUN 16 17 16 17   CREATININE 0.77 0.85 0.71 0.78  CALCIUM 8.6* 9.0 8.8* 8.4*  MG  --   --  2.4 2.4   Liver Function Tests: No results for input(s): "AST", "ALT", "ALKPHOS", "BILITOT", "PROT", "ALBUMIN" in the last 168 hours. No results for input(s): "LIPASE", "AMYLASE" in the last 168 hours. No results for input(s): "AMMONIA" in the last 168 hours. CBC: Recent Labs  Lab 04/06/23 2249 04/07/23 0448 04/08/23 0405  04/09/23 0455  WBC 13.1* 14.8* 12.3* 9.6  HGB 10.2* 10.1* 9.7* 9.5*  HCT 34.2* 34.4* 32.5* 31.0*  MCV 93.2 94.5 94.2 92.8  PLT 462* 473* 443* 439*   Cardiac Enzymes: No results for input(s): "CKTOTAL", "CKMB", "CKMBINDEX", "TROPONINI" in the last 168 hours. BNP: Invalid input(s): "POCBNP" CBG: No results for input(s): "GLUCAP" in the last 168 hours. D-Dimer No results for input(s): "DDIMER" in the last 72 hours. Hgb A1c No results for input(s): "HGBA1C" in the last 72 hours. Lipid Profile No results for input(s): "CHOL", "HDL", "LDLCALC", "TRIG", "CHOLHDL", "LDLDIRECT" in the last 72 hours. Thyroid function studies No results for input(s): "TSH", "T4TOTAL", "T3FREE", "THYROIDAB" in the last 72 hours.  Invalid input(s): "FREET3" Anemia work up No results for input(s): "VITAMINB12", "FOLATE", "FERRITIN", "TIBC", "IRON", "RETICCTPCT" in the last 72 hours. Urinalysis    Component Value Date/Time   COLORURINE STRAW (A) 03/11/2023 1803   APPEARANCEUR CLEAR 03/11/2023 1803   LABSPEC 1.005 03/11/2023 1803   PHURINE 6.0 03/11/2023 1803   GLUCOSEU 50 (A) 03/11/2023 1803   GLUCOSEU NEGATIVE 05/29/2010 1622   HGBUR NEGATIVE 03/11/2023 1803   BILIRUBINUR NEGATIVE 03/11/2023 1803   BILIRUBINUR + 11/08/2020 1604   KETONESUR NEGATIVE 03/11/2023 1803   PROTEINUR NEGATIVE 03/11/2023 1803   UROBILINOGEN 1.0 04/30/2020 0831   UROBILINOGEN 0.2 06/16/2014 0942   NITRITE NEGATIVE 03/11/2023 1803   LEUKOCYTESUR NEGATIVE 03/11/2023 1803   Sepsis Labs Recent Labs  Lab 04/06/23 2249 04/07/23 0448 04/08/23 0405 04/09/23 0455  WBC 13.1* 14.8* 12.3* 9.6   Microbiology Recent Results (from the past 240 hour(s))  Resp panel by RT-PCR (RSV, Flu A&B, Covid) Anterior Nasal Swab     Status: None   Collection Time: 04/06/23 10:49 PM   Specimen: Anterior Nasal Swab  Result Value Ref Range Status   SARS Coronavirus 2 by RT PCR NEGATIVE NEGATIVE Final    Comment: (NOTE) SARS-CoV-2 target  nucleic acids are NOT DETECTED.  The SARS-CoV-2 RNA is generally detectable in upper respiratory specimens during the acute phase of infection. The lowest concentration of SARS-CoV-2 viral copies this assay can detect is 138 copies/mL. A negative result does not preclude SARS-Cov-2 infection and should not be used as the sole basis for treatment or other patient management decisions. A negative result may occur with  improper specimen collection/handling, submission of specimen other than nasopharyngeal swab, presence of viral mutation(s) within the areas targeted by this assay, and inadequate number of viral copies(<138 copies/mL). A negative result must be combined with clinical observations, patient history, and epidemiological information. The expected result is Negative.  Fact Sheet for Patients:  BloggerCourse.com  Fact Sheet for Healthcare Providers:  SeriousBroker.it  This test is no t yet approved or cleared by the [place] FDA and  has been authorized for detection and/or diagnosis of SARS-CoV-2 by FDA under an Emergency Use Authorization (  EUA). This EUA will remain  in effect (meaning this test can be used) for the duration of the COVID-19 declaration under Section 564(b)(1) of the Act, 21 U.S.C.section 360bbb-3(b)(1), unless the authorization is terminated  or revoked sooner.       Influenza A by PCR NEGATIVE NEGATIVE Final   Influenza B by PCR NEGATIVE NEGATIVE Final    Comment: (NOTE) The Xpert Xpress SARS-CoV-2/FLU/RSV plus assay is intended as an aid in the diagnosis of influenza from Nasopharyngeal swab specimens and should not be used as a sole basis for treatment. Nasal washings and aspirates are unacceptable for Xpert Xpress SARS-CoV-2/FLU/RSV testing.  Fact Sheet for Patients: BloggerCourse.com  Fact Sheet for Healthcare  Providers: SeriousBroker.it  This test is not yet approved or cleared by the [place] FDA and has been authorized for detection and/or diagnosis of SARS-CoV-2 by FDA under an Emergency Use Authorization (EUA). This EUA will remain in effect (meaning this test can be used) for the duration of the COVID-19 declaration under Section 564(b)(1) of the Act, 21 U.S.C. section 360bbb-3(b)(1), unless the authorization is terminated or revoked.     Resp Syncytial Virus by PCR NEGATIVE NEGATIVE Final    Comment: (NOTE) Fact Sheet for Patients: BloggerCourse.com  Fact Sheet for Healthcare Providers: SeriousBroker.it  This test is not yet approved or cleared by the [place] FDA and has been authorized for detection and/or diagnosis of SARS-CoV-2 by FDA under an Emergency Use Authorization (EUA). This EUA will remain in effect (meaning this test can be used) for the duration of the COVID-19 declaration under Section 564(b)(1) of the Act, 21 U.S.C. section 360bbb-3(b)(1), unless the authorization is terminated or revoked.  Performed at [facility] [address], [place], [place] [address]      Time coordinating discharge: 35 minutes  SIGNED:   Erick Blinks, DO [facility] 04/09/2023, 2:41 PM  If 7PM-7AM, please contact night-coverage [email or web address]

## 2023-04-09 NOTE — Progress Notes (Signed)
Patient c/o this am about SOB,audible wheezing noted. Oxygen saturation was 100 percent. Patient c/o left foot pain. Percocet times 1 tab given per MAR prn for pain. Plan of care on going.

## 2023-04-10 ENCOUNTER — Other Ambulatory Visit (HOSPITAL_COMMUNITY): Payer: Self-pay

## 2023-04-10 ENCOUNTER — Other Ambulatory Visit: Payer: Self-pay

## 2023-04-10 ENCOUNTER — Telehealth: Payer: Self-pay | Admitting: Family Medicine

## 2023-04-10 DIAGNOSIS — F418 Other specified anxiety disorders: Secondary | ICD-10-CM | POA: Diagnosis not present

## 2023-04-10 DIAGNOSIS — M797 Fibromyalgia: Secondary | ICD-10-CM | POA: Diagnosis not present

## 2023-04-10 DIAGNOSIS — J441 Chronic obstructive pulmonary disease with (acute) exacerbation: Secondary | ICD-10-CM | POA: Diagnosis not present

## 2023-04-10 DIAGNOSIS — G894 Chronic pain syndrome: Secondary | ICD-10-CM | POA: Diagnosis not present

## 2023-04-10 DIAGNOSIS — J9611 Chronic respiratory failure with hypoxia: Secondary | ICD-10-CM | POA: Diagnosis not present

## 2023-04-10 DIAGNOSIS — Z9981 Dependence on supplemental oxygen: Secondary | ICD-10-CM | POA: Diagnosis not present

## 2023-04-10 DIAGNOSIS — F32A Depression, unspecified: Secondary | ICD-10-CM | POA: Diagnosis not present

## 2023-04-10 DIAGNOSIS — E039 Hypothyroidism, unspecified: Secondary | ICD-10-CM | POA: Diagnosis not present

## 2023-04-10 DIAGNOSIS — J849 Interstitial pulmonary disease, unspecified: Secondary | ICD-10-CM | POA: Diagnosis not present

## 2023-04-10 DIAGNOSIS — D869 Sarcoidosis, unspecified: Secondary | ICD-10-CM | POA: Diagnosis not present

## 2023-04-10 NOTE — Telephone Encounter (Signed)
Vinnie Langton- home health nurse needing verbal order for twice a week for 4 weeks,once a week for 4 weeks. Patient states her pain is 10 out of 10 with left ankle pain. Nurse is wanting to know if she can take regular oe extra strengthen tylenol in between pain medications to help with pain. Please advise [phone](Gretchen)

## 2023-04-10 NOTE — Telephone Encounter (Signed)
Her pain medicine already has Tylenol within When she takes 6 of her pain medicines per day she is getting 1900 mg of Tylenol approximately  She could supplement this with the 500 mg Tylenol no more than 2 additional tablets per day  (I am sympathetic to her pain but her current prescription is at the top of the dose that I can prescribe, please have referral coordinator connect with [facility] to see what their decision was regarding this patient, if [facility] rejects her please see if there is somewhere else that she can be referred to perhaps Dr. Trecia Rogers office in [place])

## 2023-04-11 ENCOUNTER — Other Ambulatory Visit (HOSPITAL_COMMUNITY): Payer: Self-pay

## 2023-04-11 ENCOUNTER — Telehealth: Payer: Self-pay

## 2023-04-11 MED ORDER — LIDOCAINE 5 % EX PTCH
1.0000 | MEDICATED_PATCH | Freq: Every day | CUTANEOUS | 0 refills | Status: DC
  Filled 2023-04-11 – 2023-04-30 (×3): qty 30, 30d supply, fill #0
  Filled 2023-05-09: qty 5, 5d supply, fill #0
  Filled 2023-05-09: qty 10, 10d supply, fill #0
  Filled 2023-06-03: qty 5, 5d supply, fill #1
  Filled 2023-08-01: qty 20, 20d supply, fill #2

## 2023-04-11 NOTE — Telephone Encounter (Signed)
See MyChart message

## 2023-04-11 NOTE — Telephone Encounter (Signed)
Faxed to [facility] pain management

## 2023-04-11 NOTE — Telephone Encounter (Signed)
Shirlee Limerick the referral coordinator would like Dr Lorin Picket to be aware of the following: Spoke with Fleet Contras at the the [facility]. She needs to talk with the MD and will call me back today. I asked her to call by 3:30 and she said she will. lowing.

## 2023-04-11 NOTE — Telephone Encounter (Signed)
Please fax an addendum to [facility] attention Fleet Contras

## 2023-04-15 ENCOUNTER — Telehealth: Payer: Self-pay | Admitting: *Deleted

## 2023-04-15 NOTE — Telephone Encounter (Signed)
Thank you for the update-I would recommend that she do a follow-up visit with her orthopedic doctor if she feels that the area is having severe trouble to make sure everything is okay.  As for pain management she is on the maximum amount that we can do.  She could take 500 mg Tylenol no more than 2/day in addition to what she is already doing.  I am sympathetic to her pain levels but we have been trying to get her in with pain management for quite some time-if possible-sometime in the next several days-please see if [facility] has made a decision thank you

## 2023-04-15 NOTE — Telephone Encounter (Signed)
FYI:  Home health nurse calling to report uncontrolled pain with patient - Pain is outside of their parameters. Patient reported a pain level of 9-10 on pain medication and they will continue to call till fall in parameters for adequate pain control.

## 2023-04-16 NOTE — Telephone Encounter (Signed)
Referral Coordinator has sent the referral to [facility] [place] office

## 2023-04-17 DIAGNOSIS — Z7982 Long term (current) use of aspirin: Secondary | ICD-10-CM | POA: Diagnosis not present

## 2023-04-17 DIAGNOSIS — M797 Fibromyalgia: Secondary | ICD-10-CM | POA: Diagnosis not present

## 2023-04-17 DIAGNOSIS — Z9981 Dependence on supplemental oxygen: Secondary | ICD-10-CM | POA: Diagnosis not present

## 2023-04-17 DIAGNOSIS — G894 Chronic pain syndrome: Secondary | ICD-10-CM | POA: Diagnosis not present

## 2023-04-17 DIAGNOSIS — J849 Interstitial pulmonary disease, unspecified: Secondary | ICD-10-CM | POA: Diagnosis not present

## 2023-04-17 DIAGNOSIS — F418 Other specified anxiety disorders: Secondary | ICD-10-CM | POA: Diagnosis not present

## 2023-04-17 DIAGNOSIS — D869 Sarcoidosis, unspecified: Secondary | ICD-10-CM | POA: Diagnosis not present

## 2023-04-17 DIAGNOSIS — E039 Hypothyroidism, unspecified: Secondary | ICD-10-CM | POA: Diagnosis not present

## 2023-04-17 DIAGNOSIS — J441 Chronic obstructive pulmonary disease with (acute) exacerbation: Secondary | ICD-10-CM | POA: Diagnosis not present

## 2023-04-17 DIAGNOSIS — E2740 Unspecified adrenocortical insufficiency: Secondary | ICD-10-CM | POA: Diagnosis not present

## 2023-04-17 DIAGNOSIS — F32A Depression, unspecified: Secondary | ICD-10-CM | POA: Diagnosis not present

## 2023-04-17 DIAGNOSIS — J9611 Chronic respiratory failure with hypoxia: Secondary | ICD-10-CM | POA: Diagnosis not present

## 2023-04-18 ENCOUNTER — Other Ambulatory Visit (INDEPENDENT_AMBULATORY_CARE_PROVIDER_SITE_OTHER): Payer: HMO

## 2023-04-18 ENCOUNTER — Ambulatory Visit: Payer: HMO | Admitting: Orthopedic Surgery

## 2023-04-18 DIAGNOSIS — S82892D Other fracture of left lower leg, subsequent encounter for closed fracture with routine healing: Secondary | ICD-10-CM

## 2023-04-18 DIAGNOSIS — M25572 Pain in left ankle and joints of left foot: Secondary | ICD-10-CM | POA: Diagnosis not present

## 2023-04-18 NOTE — Progress Notes (Signed)
Orthopedic Surgery Post-operative Office Visit   Procedure: Left ankle fracture ORIF Date of Surgery: 03/10/2023 (~6 weeks post-op)   Assessment: Patient is a 62 y.o. who showed interval displacement of her last visit but has not developed any further displacement     Plan: -Operative plans complete -My plan was to keep her cast on for another 6 weeks given that she had displaced early on in the postoperative course.  I explained that the cast provides her more stability than the boot.  She was able to repeat back the rationale of my recommendation.  Afterwards, she insisted that I take her out of the cast and place her into a boot after covering the risks of conversion to a boot and the fact that it does not provide the same stability.  Cast was taken off in the office today and a boot was applied -I reiterated that she should stay strictly nonweightbearing until her next office visit -If her fracture appears similar to today's films, then we will allow her to start weightbearing at her next visit in the boot -Return to office in 6 weeks, x-rays needed at next visit: AP/lateral/mortise out of boot   ___________________________________________________________________________     Subjective: Patient has been doing well since she was last seen.  Her ankle pain has decreased significantly.  She has been nonweightbearing in the cast.  She has not had any fevers or chills.  She has not noticed any drainage around the cast.  She is frustrated because she has not been able to bathe herself.  She and her husband looked into waterproof bags but she does not want a shower.  She exclusively wants to take a bath in the waterproof bags do not allow for submersion.   Objective:   General: no acute distress, appropriate affect Neurologic: alert, answering questions appropriately, following commands Respiratory: unlabored breathing on room air Skin: Incisions over the medial and lateral aspect of the ankle  are well-healed with no erythema, induration, active/expressible drainage   MSK (LLE): Dorsiflexion to neutral, plantarflexion to 20 degrees, dorsiflexes and plantar flexes toes, sensation intact to light touch in sural/saphenous/deep peroneal/superficial peroneal/tibial nerve distributions, foot warm and well-perfused     Imaging: X-rays of the left ankle taken 04/18/2023 were independently reviewed and interpreted, showing similar displacement of her medial malleolus and lateral malleolus fractures to her films on 03/29/2023.  There is slight lateral translation of both fractures.  The talus appears to be slightly translated laterally when compared to her immediate postoperative films.  No lucency is seen around the screws.  Screws have not backed out.  No new fractures seen.      Patient name: Kathleen [name] Patient MRN: [id number] Date of visit: 04/18/23

## 2023-04-19 ENCOUNTER — Other Ambulatory Visit (HOSPITAL_COMMUNITY): Payer: Self-pay

## 2023-04-19 ENCOUNTER — Other Ambulatory Visit: Payer: Self-pay

## 2023-04-19 ENCOUNTER — Other Ambulatory Visit: Payer: Self-pay | Admitting: Family Medicine

## 2023-04-22 ENCOUNTER — Telehealth: Payer: Self-pay | Admitting: Family Medicine

## 2023-04-22 NOTE — Telephone Encounter (Signed)
Noted thank you

## 2023-04-22 NOTE — Telephone Encounter (Signed)
Home Health Nurse -Tacey Ruiz called about patient pain level its a 7 or 8 out of 10. If any questions. Call Leah-[phone]

## 2023-04-23 ENCOUNTER — Other Ambulatory Visit (HOSPITAL_COMMUNITY): Payer: Self-pay

## 2023-04-23 DIAGNOSIS — E2740 Unspecified adrenocortical insufficiency: Secondary | ICD-10-CM | POA: Diagnosis not present

## 2023-04-23 DIAGNOSIS — E039 Hypothyroidism, unspecified: Secondary | ICD-10-CM | POA: Diagnosis not present

## 2023-04-23 DIAGNOSIS — E782 Mixed hyperlipidemia: Secondary | ICD-10-CM | POA: Diagnosis not present

## 2023-04-23 MED ORDER — ONDANSETRON 8 MG PO TBDP
8.0000 mg | ORAL_TABLET | Freq: Three times a day (TID) | ORAL | 5 refills | Status: DC | PRN
Start: 2023-04-23 — End: 2024-02-25
  Filled 2023-04-23: qty 15, 15d supply, fill #0
  Filled 2023-05-17: qty 15, 30d supply, fill #1
  Filled 2023-08-01: qty 15, 30d supply, fill #2
  Filled 2023-08-31: qty 15, 30d supply, fill #3
  Filled 2023-11-22: qty 15, 30d supply, fill #4

## 2023-04-24 ENCOUNTER — Other Ambulatory Visit (HOSPITAL_COMMUNITY): Payer: Self-pay

## 2023-04-24 ENCOUNTER — Ambulatory Visit (INDEPENDENT_AMBULATORY_CARE_PROVIDER_SITE_OTHER): Payer: HMO | Admitting: "Endocrinology

## 2023-04-24 ENCOUNTER — Encounter: Payer: Self-pay | Admitting: "Endocrinology

## 2023-04-24 VITALS — BP 130/62 | HR 68

## 2023-04-24 DIAGNOSIS — M8588 Other specified disorders of bone density and structure, other site: Secondary | ICD-10-CM

## 2023-04-24 DIAGNOSIS — E039 Hypothyroidism, unspecified: Secondary | ICD-10-CM | POA: Diagnosis not present

## 2023-04-24 DIAGNOSIS — E2740 Unspecified adrenocortical insufficiency: Secondary | ICD-10-CM

## 2023-04-24 DIAGNOSIS — E782 Mixed hyperlipidemia: Secondary | ICD-10-CM

## 2023-04-24 MED ORDER — SOLU-MEDROL (PF) 125 MG IJ SOLR
INTRAMUSCULAR | 0 refills | Status: DC
Start: 2023-04-24 — End: 2023-05-10
  Filled 2023-04-24: qty 1, 1d supply, fill #0

## 2023-04-24 NOTE — Progress Notes (Signed)
04/24/2023, 4:30 PM     Endocrinology follow-up note  Subjective:    Patient ID: Kathleen [name], female    DOB: 1960/11/28, PCP [name], MD   Past Medical History:  Diagnosis Date   Adrenal insufficiency (HCC)    Anemia    Anxiety    Avascular bone necrosis (HCC)    Breast fibrocystic disorder    Chronic pain    COPD (chronic obstructive pulmonary disease) (HCC)    Encounter for long-term opiate analgesic use 02/02/2020   Patient has chronic pain with femoral head avascular necrosis.  Is under pain contract   Fibromyalgia    Gastroesophageal reflux disease    Hypothyroidism    Mitral valve prolapse    Orthostatic hypotension    Peripheral neuropathy    PONV (postoperative nausea and vomiting)    Restrictive lung disease    Moderate to severe   Sarcoidosis    Biopsy proven - [facility]   SOB (shortness of breath)    chronic   Past Surgical History:  Procedure Laterality Date   BIOPSY  10/17/2018   Procedure: BIOPSY;  Surgeon: Malissa Hippo, MD;  [facility]: [facility];  Service: Endoscopy;;  duodenum, antrum, gastric body   BIOPSY  03/03/2021   Procedure: BIOPSY;  Surgeon: Malissa Hippo, MD;  [facility]: [facility];  Service: Endoscopy;;   BREAST LUMPECTOMY Bilateral 01/12/2011   CHOLECYSTECTOMY N/A 04/17/2021   Procedure: LAPAROSCOPIC CHOLECYSTECTOMY;  Surgeon: Franky Macho, MD;  Location: [facility];  Service: General;  Laterality: N/A;   COLONOSCOPY WITH PROPOFOL N/A 01/03/2022   Procedure: COLONOSCOPY WITH PROPOFOL;  Surgeon: Malissa Hippo, MD;  [facility];  Service: Endoscopy;  Laterality: N/A;  220   ESOPHAGEAL DILATION N/A 03/03/2021   Procedure: ESOPHAGEAL DILATION;  Surgeon: Malissa Hippo, MD;  [facility];  Service: Endoscopy;  Laterality: N/A;   ESOPHAGOGASTRODUODENOSCOPY (EGD) WITH PROPOFOL N/A 10/17/2018   Procedure: ESOPHAGOGASTRODUODENOSCOPY (EGD) WITH PROPOFOL;   Surgeon: Malissa Hippo, MD;  [facility];  Service: Endoscopy;  Laterality: N/A;  2:25   ESOPHAGOGASTRODUODENOSCOPY (EGD) WITH PROPOFOL N/A 03/03/2021   Procedure: ESOPHAGOGASTRODUODENOSCOPY (EGD) WITH PROPOFOL;  Surgeon: Malissa Hippo, MD;  [facility];  Service: Endoscopy;  Laterality: N/A;  12:15   ORIF ANKLE FRACTURE Left 03/10/2023   Procedure: OPEN REDUCTION INTERNAL FIXATION (ORIF) ANKLE FRACTURE;  Surgeon: [name], MD;  Location: [facility];  Service: Orthopedics;  Laterality: Left;   POLYPECTOMY  01/03/2022   Procedure: POLYPECTOMY INTESTINAL;  Surgeon: Malissa Hippo, MD;  [facility];  Service: Endoscopy;;   TUBAL LIGATION     Social History   Socioeconomic History   Marital status: Married    Spouse name: Not on file   Number of children: 1   Years of education: Not on file   Highest education level: Not on file  Occupational History   Occupation: Accounts Payable    Employer: [employer]  Tobacco Use   Smoking status: Never    Passive exposure: Never   Smokeless tobacco: Never  Vaping Use   Vaping status: Never Used  Substance and Sexual Activity   Alcohol use: No  Drug use: No   Sexual activity: Yes    Birth control/protection: Post-menopausal  Other Topics Concern   Not on file  Social History Narrative   Daily caffeine    Living with husband   Right handed   She has been on disability since 2014.    Social Determinants of Health   Financial Resource Strain: Low Risk  (09/21/2022)   Overall Financial Resource Strain (CARDIA)    Difficulty of Paying Living Expenses: Not hard at all  Food Insecurity: No Food Insecurity (04/07/2023)   Hunger Vital Sign    Worried About Running Out of Food in the Last Year: Never true    Ran Out of Food in the Last Year: Never true  Transportation Needs: No Transportation Needs (04/07/2023)   PRAPARE - Administrator, Civil Service (Medical): No    Lack of  Transportation (Non-Medical): No  Physical Activity: Inactive (09/21/2022)   Exercise Vital Sign    Days of Exercise per Week: 0 days    Minutes of Exercise per Session: 0 min  Stress: No Stress Concern Present (01/14/2023)   [employer] of Occupational Health - Occupational Stress Questionnaire    Feeling of Stress : Only a little  Social Connections: Socially Integrated (09/21/2022)   Social Connection and Isolation Panel [NHANES]    Frequency of Communication with Friends and Family: More than three times a week    Frequency of Social Gatherings with Friends and Family: Three times a week    Attends Religious Services: 1 to 4 times per year    Active Member of Clubs or Organizations: No    Attends Engineer, structural: 1 to 4 times per year    Marital Status: Married   Family History  Problem Relation Age of Onset   Cardiomyopathy Mother    Breast cancer Mother    Prostate cancer Father    Heart attack Maternal Grandmother    Heart attack Maternal Grandfather    Heart attack Paternal Grandmother    Bipolar disorder Daughter    Heart disease Maternal Aunt    Neurofibromatosis Cousin    Colon cancer Neg Hx    Outpatient Encounter Medications as of 04/24/2023  Medication Sig   methylPREDNISolone sodium succinate (SOLU-MEDROL) 125 mg/2 mL injection IV preferred, can also be given IM if IV access is difficult   albuterol (PROVENTIL) (2.5 MG/3ML) 0.083% nebulizer solution Take 3 mLs (2.5 mg total) by nebulization every 4 (four) hours as needed for wheezing or shortness of breath.   albuterol (VENTOLIN HFA) 108 (90 Base) MCG/ACT inhaler Inhale 2 puffs into the lungs every 6 (six) hours as needed for wheezing or shortness of breath.   aspirin EC 81 MG tablet Take 1 tablet (81 mg total) by mouth 2 (two) times daily. Swallow whole. (Patient taking differently: Take 81 mg by mouth daily. Swallow whole.)   budesonide (PULMICORT) 0.5 MG/2ML nebulizer solution Inhale the contents  of 1 vial via nebulizer two (2) times a day.   busPIRone (BUSPAR) 15 MG tablet Take 1 tablet (15 mg) by mouth 3 times daily.   calcitonin, salmon, (MIACALCIN) 200 UNIT/ACT nasal spray Use 1 spray in one nostril per day, alternate nostrils every other day (may have 1 vial she should use this for at least 2 weeks but may use as long as 4 weeks)   DULoxetine (CYMBALTA) 60 MG capsule Take 1 capsule (60 mg) by mouth daily.   EPINEPHrine 0.3 mg/0.3 mL IJ SOAJ injection  Inject 0.3 mg into the muscle as needed for anaphylaxis.   ferrous sulfate 325 (65 FE) MG EC tablet Take 325 mg by mouth once a week.   hydrocortisone (CORTEF) 10 MG tablet Take 2 tablets by mouth at 8:00 am and 1 tablet at lunch. (May double dose on sick days x 3-5 days)   hydrOXYzine (ATARAX) 25 MG tablet Take 1 tablet (25 mg total) by mouth 3 (three) times daily as needed for anxiety.   ipratropium-albuterol (DUONEB) 0.5-2.5 (3) MG/3ML SOLN Take 3 mLs by nebulization every 8 (eight) hours.   levothyroxine (SYNTHROID) 50 MCG tablet Take 1 tablet by mouth daily before breakfast.   lidocaine (LIDODERM) 5 % Place 1 patch onto the skin daily. Remove & Discard patch within 12 hours or as directed by MD   lidocaine (LIDODERM) 5 % Place 1 patch onto the skin daily. Remove and discard within 12 hours as directed by MD   linaclotide (LINZESS) 290 MCG CAPS capsule Take 1 capsule (290 mcg total) by mouth daily before breakfast.   naloxone (NARCAN) nasal spray 4 mg/0.1 mL Use as directed for opioid overdose   ondansetron (ZOFRAN-ODT) 8 MG disintegrating tablet Take 8 mg by mouth every 8 (eight) hours as needed for nausea or vomiting.   ondansetron (ZOFRAN-ODT) 8 MG disintegrating tablet Dissolve 1 tablet (8 mg total) by mouth every 8 (eight) hours as needed.   oxyCODONE-acetaminophen (PERCOCET) 10-325 MG tablet Take 1 tablet by mouth every 4 (four) hours as needed for pain maximum 6/day not with any other form of oxycodone   pantoprazole (PROTONIX)  40 MG tablet Take 1 tablet (40 mg total) by mouth 2 (two) times daily before a meal.   predniSONE (DELTASONE) 5 MG tablet Take 1 tablet (5 mg total) by mouth in the morning.   rosuvastatin (CRESTOR) 10 MG tablet Take 1 tablet (10 mg total) by mouth daily.   senna-docusate (SENOKOT-S) 8.6-50 MG tablet Take 1 tablet by mouth 2 (two) times daily between meals as needed for mild constipation.   No facility-administered encounter medications on file as of 04/24/2023.   ALLERGIES: Allergies  Allergen Reactions   Bee Venom Anaphylaxis   Contrast Media [Iodinated Contrast Media]     CT chest w/ contrast at OSH followed by SOB resolved w/ single dose steroids, never ENT swelling, intubation or pressors, has had multiple contrasted scans since   Bactrim [Sulfamethoxazole-Trimethoprim] Nausea And Vomiting   Codeine Itching   Morphine And Codeine Nausea And Vomiting and Other (See Comments)    Dizziness  Confusion    VACCINATION STATUS: Immunization History  Administered Date(s) Administered   Influenza Whole 06/27/2010, 07/28/2011   Influenza-Unspecified 05/27/2013   Moderna Sars-Covid-2 Vaccination 10/27/2019, 11/27/2019   Pneumococcal Conjugate-13 06/15/2014   Pneumococcal Polysaccharide-23 06/10/2013    HPI Kathleen [name] is 62 y.o. female who is being seen in follow-up in the management of adrenal insufficiency, hypothyroidism.  -She is on a stable dose of steroids including hydrocortisone 20 mg p.o. daily in the morning, 10 mg p.o. daily at noon.    She is currently on levothyroxine 50 mcg p.o. daily before breakfast. She is returning for follow-up.  In the interim, after a fall accident she sustained closed fracture of left ankle currently on wheelchair.    - Her history is complicated including sarcoidosis which she dealt with for more than the last 10 years.  She is on regular follow-up with her pulmonologist.  Over the years, her therapy included high-dose steroids, up  to 60 mg  daily of prednisone, for the last 4-6 years.  She is currently on maintenance dose of prednisone 5 mg p.o. daily.  For steroids induced adrenal  insufficiency, she was initiated and kept on hydrocortisone. She is currently on 20 mg p.o. every morning and 10 mg p.o. daily every noon.     -She reports better consistency taking her medications this time.     -She has significant cognitive deficit.  - The details of her diagnosis with adrenal insufficiency  is not available to review. -She is not wearing her medic alert today.  . She denies any history of tuberculosis, incarceration, nor overseas travel . -She denies family history of adrenal, pituitary dysfunction. She has family history of hypothyroidism in one sibling. - Denies any history of intra-abdominal injury. She is on hydrocodone for chronic pain and inhaled bronchodilators. She is known to have orthostatic hypotension for which she is on midodrine.     Review of Systems Limited as above.  Objective:    BP 130/62   Pulse 68   LMP 05/17/2016   Wt Readings from Last 3 Encounters:  04/06/23 240 lb (108.9 kg)  04/02/23 240 lb 4.8 oz (109 kg)  03/10/23 240 lb 4.8 oz (109 kg)    Physical Exam    CMP ( most recent) CMP     Component Value Date/Time   NA 138 04/23/2023 1008   K 5.0 04/23/2023 1008   CL 97 04/23/2023 1008   CO2 28 04/23/2023 1008   GLUCOSE 180 (H) 04/23/2023 1008   GLUCOSE 131 (H) 04/09/2023 0455   BUN 9 04/23/2023 1008   CREATININE 0.72 04/23/2023 1008   CREATININE 0.85 08/26/2014 0831   CALCIUM 9.5 04/23/2023 1008   PROT 6.1 04/23/2023 1008   ALBUMIN 4.0 04/23/2023 1008   AST 12 04/23/2023 1008   ALT 15 04/23/2023 1008   ALKPHOS 107 04/23/2023 1008   BILITOT 0.2 04/23/2023 1008   GFRNONAA >60 04/09/2023 0455   GFRAA 92 05/24/2020 1038     Diabetic Labs (most recent): Lab Results  Component Value Date   HGBA1C 5.4 08/04/2021   HGBA1C 5.4 02/03/2019   HGBA1C 5.0 12/31/2016     Lipid Panel  ( most recent) Lipid Panel     Component Value Date/Time   CHOL 192 04/23/2023 1008   TRIG 192 (H) 04/23/2023 1008   HDL 79 04/23/2023 1008   CHOLHDL 2.4 04/23/2023 1008   CHOLHDL 4.0 08/26/2014 0831   VLDL 31 08/26/2014 0831   LDLCALC 81 04/23/2023 1008      Lab Results  Component Value Date   TSH 0.449 (L) 04/23/2023   TSH 0.715 09/26/2022   TSH 0.668 03/16/2022   TSH 0.088 (L) 09/14/2021   TSH 0.668 05/08/2021   TSH 0.307 (L) 11/09/2020   TSH 0.548 05/24/2020   TSH 0.689 11/20/2019   TSH 0.364 (L) 06/29/2019   TSH 0.746 02/03/2019   FREET4 1.16 04/23/2023   FREET4 1.11 09/26/2022   FREET4 1.08 03/16/2022   FREET4 1.71 09/14/2021   FREET4 1.06 05/08/2021   FREET4 1.32 11/09/2020   FREET4 1.46 05/24/2020   FREET4 1.25 11/20/2019   FREET4 1.43 06/29/2019   FREET4 1.32 02/03/2019      Assessment & Plan:   1. Adrenal insufficiency (HCC) -I have reviewed her new set of labs, available records and clinically evaluated this patient. She does have multiple risk factors for adrenal insufficiency  including exposure to high-dose steroids related to her sarcoidosis,  as well as opioid therapy related to diffuse pain. -  She has taken prednisone up to 60 mg daily for up to 6 years prior to her diagnosis with adrenal insufficiency.   She will not need any higher dose of steroids than what she takes. -She is on a stable dose of hydrocortisone 20 mg p.o. daily at 8 AM and 10 mg p.o. daily at noon.  She is also getting an additional 5 mg of prednisone daily related to her pulmonary sarcoidosis.    She did not tolerate an attempt to withdraw her prednisone.     I had a lengthy discussion with her about the need to avoid any further unnecessary escalation on steroid replacement.    She is advised to remain on same steroid replacement dose and schedule.   -She will not be considered for work-up by withdrawing steroids supports,  risk of adrenal crisis.   -She does not have enough  support to perform steroid injection at home.  She wishes to have an emergency Solu-Medrol IV kit which I discussed and prescribed for her. -She is advised to go to ER if she cannot keep her medications down due to intractable vomiting. - She will be evaluated for the need for mineralocorticoid replacement which may help to stabilize her blood pressure.    - I had a long discussion with her regarding steroids sick day rules, risk of unintended weight gain/diabetes and the need for periodic screening with A1c.  She is also advised to wear medic alert typically her adrenal insufficiency and the need for steroids.  -Her last A1c was 5.4% in June 2020.  She will have point-of-care A1c during her next visit.      2. Hypothyroidism:  -Her recent labs for thyroid function are consistent with appropriate replacement.  She is advised to continue levothyroxine 50 mcg p.o. daily before breakfast.     - We discussed about the correct intake of her thyroid hormone, on empty stomach at fasting, with water, separated by at least 30 minutes from breakfast and other medications,  and separated by more than 4 hours from calcium, iron, multivitamins, acid reflux medications (PPIs). -Patient is made aware of the fact that thyroid hormone replacement is needed for life, dose to be adjusted by periodic monitoring of thyroid function tests.   She has severe hyperlipidemia, responded to Crestor 10 mg p.o. nightly.  She is advised to continue same dose.  Side effects and precautions discussed with her.   -She is also good candidate for lifestyle medicine.  Whole Food , plant-based diet was discussed with her at length during her previous visit.   - I advised her  to maintain close follow up with [name], MD for primary care needs.  I spent  25  minutes in the care of the patient today including review of labs from Thyroid Function, CMP, and other relevant labs ; imaging/biopsy records (current and previous  including abstractions from other facilities); face-to-face time discussing  her lab results and symptoms, medications doses, her options of short and long term treatment based on the latest standards of care / guidelines;   and documenting the encounter.  [name]  participated in the discussions, expressed understanding, and voiced agreement with the above plans.  All questions were answered to her satisfaction. she is encouraged to contact clinic should she have any questions or concerns prior to her return visit.   Follow up plan: Return in about 6 months (around  10/25/2023) for DXA Scan B4 NV, F/U with Pre-visit Labs, A1c -NV.   Marquis Lunch, MD [facility] [address] [place], [place] [address] Phone: [phone]  Fax: [phone]     04/24/2023, 4:30 PM  This note was partially dictated with voice recognition software. Similar sounding words can be transcribed inadequately or may not  be corrected upon review.

## 2023-04-25 ENCOUNTER — Other Ambulatory Visit (HOSPITAL_COMMUNITY): Payer: Self-pay

## 2023-04-25 ENCOUNTER — Other Ambulatory Visit: Payer: Self-pay | Admitting: Family Medicine

## 2023-04-25 ENCOUNTER — Ambulatory Visit: Payer: Self-pay | Admitting: *Deleted

## 2023-04-25 NOTE — Patient Outreach (Signed)
  Care Coordination   04/25/2023 Name: PORCHA AVINO MRN: [id number] DOB: 07-26-1961   Care Coordination Outreach Attempts:  An unsuccessful telephone outreach was attempted for a scheduled appointment today.  Follow Up Plan:  Additional outreach attempts will be made to offer the patient care coordination information and services.   Encounter Outcome:  Pt. Request to Call Back   Care Coordination Interventions:  No, not indicated    Marqui Formby L. [name], RN, BSN, CCM, Care Management Coordinator [phone]

## 2023-04-26 ENCOUNTER — Emergency Department (HOSPITAL_BASED_OUTPATIENT_CLINIC_OR_DEPARTMENT_OTHER)
Admission: EM | Admit: 2023-04-26 | Discharge: 2023-04-26 | Disposition: A | Discharge status: Home / Self Care | Payer: HMO | Source: Home / Self Care | Attending: Emergency Medicine | Admitting: Emergency Medicine

## 2023-04-26 ENCOUNTER — Other Ambulatory Visit (HOSPITAL_COMMUNITY): Payer: Self-pay

## 2023-04-26 ENCOUNTER — Other Ambulatory Visit: Payer: Self-pay | Admitting: Family Medicine

## 2023-04-26 ENCOUNTER — Other Ambulatory Visit: Payer: Self-pay

## 2023-04-26 ENCOUNTER — Encounter (HOSPITAL_BASED_OUTPATIENT_CLINIC_OR_DEPARTMENT_OTHER): Payer: Self-pay | Admitting: Emergency Medicine

## 2023-04-26 ENCOUNTER — Emergency Department (HOSPITAL_BASED_OUTPATIENT_CLINIC_OR_DEPARTMENT_OTHER): Payer: HMO

## 2023-04-26 ENCOUNTER — Telehealth: Payer: Self-pay | Admitting: *Deleted

## 2023-04-26 DIAGNOSIS — G8929 Other chronic pain: Secondary | ICD-10-CM | POA: Diagnosis not present

## 2023-04-26 DIAGNOSIS — J441 Chronic obstructive pulmonary disease with (acute) exacerbation: Secondary | ICD-10-CM

## 2023-04-26 DIAGNOSIS — R079 Chest pain, unspecified: Secondary | ICD-10-CM | POA: Diagnosis not present

## 2023-04-26 DIAGNOSIS — R0602 Shortness of breath: Secondary | ICD-10-CM

## 2023-04-26 DIAGNOSIS — R0789 Other chest pain: Secondary | ICD-10-CM | POA: Diagnosis not present

## 2023-04-26 LAB — CBC
HCT: 38.4 % (ref 36.0–46.0)
Hemoglobin: 11.8 g/dL — ABNORMAL LOW (ref 12.0–15.0)
MCH: 27.8 pg (ref 26.0–34.0)
MCHC: 30.7 g/dL (ref 30.0–36.0)
MCV: 90.6 fL (ref 80.0–100.0)
Platelets: 364 10*3/uL (ref 150–400)
RBC: 4.24 MIL/uL (ref 3.87–5.11)
RDW: 15.6 % — ABNORMAL HIGH (ref 11.5–15.5)
WBC: 12.3 10*3/uL — ABNORMAL HIGH (ref 4.0–10.5)
nRBC: 0.2 % (ref 0.0–0.2)

## 2023-04-26 LAB — BASIC METABOLIC PANEL
Anion gap: 11 (ref 5–15)
BUN: 17 mg/dL (ref 6–20)
CO2: 34 mmol/L — ABNORMAL HIGH (ref 22–32)
Calcium: 9.6 mg/dL (ref 8.9–10.3)
Chloride: 99 mmol/L (ref 98–111)
Creatinine, Ser: 1.11 mg/dL — ABNORMAL HIGH (ref 0.44–1.00)
GFR, Estimated: 57 mL/min — ABNORMAL LOW (ref >60)
Glucose, Bld: 126 mg/dL — ABNORMAL HIGH (ref 70–99)
Potassium: 4.5 mmol/L (ref 3.5–5.1)
Sodium: 144 mmol/L (ref 135–145)

## 2023-04-26 LAB — TROPONIN I (HIGH SENSITIVITY)
Troponin I (High Sensitivity): 6 ng/L (ref <18)
Troponin I (High Sensitivity): 5 ng/L (ref <18)

## 2023-04-26 MED ORDER — IPRATROPIUM-ALBUTEROL 0.5-2.5 (3) MG/3ML IN SOLN: 3.0000 mL | Freq: Once | RESPIRATORY_TRACT | Status: AC

## 2023-04-26 MED ORDER — OXYCODONE-ACETAMINOPHEN 10-325 MG PO TABS
1.0000 | ORAL_TABLET | ORAL | 0 refills | Status: DC | PRN
Start: 2023-04-26 — End: 2023-04-26
  Filled 2023-04-26: qty 126, 21d supply, fill #0

## 2023-04-26 MED ORDER — HYDROCORTISONE SOD SUC (PF) 100 MG IJ SOLR
100.0000 mg | Freq: Once | INTRAMUSCULAR | Status: AC
  Administered 2023-04-26: 100 mg via INTRAVENOUS
  Filled 2023-04-26: qty 2

## 2023-04-26 MED ORDER — IPRATROPIUM-ALBUTEROL 0.5-2.5 (3) MG/3ML IN SOLN
RESPIRATORY_TRACT | Status: AC
  Administered 2023-04-26: 3 mL via RESPIRATORY_TRACT
  Filled 2023-04-26: qty 3

## 2023-04-26 MED ORDER — OXYCODONE-ACETAMINOPHEN 10-325 MG PO TABS
1.0000 | ORAL_TABLET | ORAL | 0 refills | Status: DC | PRN
  Filled 2023-04-26: qty 126, 21d supply, fill #0

## 2023-04-26 MED ORDER — LORAZEPAM 2 MG/ML IJ SOLN
0.5000 mg | Freq: Once | INTRAMUSCULAR | Status: AC
  Administered 2023-04-26: 0.5 mg via INTRAVENOUS
  Filled 2023-04-26: qty 1

## 2023-04-26 NOTE — Progress Notes (Signed)
Pt stated she forgot her Oxygen when she came to the ED.  Pt placed on 3l [place] with sats of 92% initially and 98% after checking about later.  Charge RN made aware that pt is on O2 in the lobby.

## 2023-04-26 NOTE — Telephone Encounter (Signed)
Rx sent to [facility]

## 2023-04-26 NOTE — Telephone Encounter (Signed)
Patient called stating she needs refill of her oxycodone ASAP- she is out  [facility]

## 2023-04-26 NOTE — Discharge Instructions (Signed)
1.  Follow-up with your doctor soon as possible for recheck. 2.  If you take medications that are sedating for pain or anxiety, you risk making your breathing much worse and going into respiratory failure.  Limit these to the absolute minimum. 3.  Continue your inhalers at home.

## 2023-04-26 NOTE — ED Notes (Signed)
Pt w/SOB and anxiety (per pt). Pt w/hx of ILD and pulmonary Sarcoidosis. Pulmonology at [facility]. RT assessed for SOB, pt respiratory status stable on home O2 [place] 4 Lpm w/no distress noted but with some increased WOB which pt states is normal for her with her anxiety and lung issues. RT administered Duo neb per protocol assessment and home regimen. RT will continue to monitor while at [facility].    04/26/23 2011  Aerosol Therapy Tx  $ Hand Held Nebulizer  1  Medications Duoneb  Delivery Source Air  Delivery Device HHN  Pre-Treatment Pulse 102  Pre-Treatment Respirations 22  Post-Treatment Pulse 102  Post-Treatment Respirations 21  Treatment Tolerance Tolerated well  Treatment Given 1  MEWS Score/Color  MEWS Score 1  MEWS Score Color Green  RT Breath Sounds  Bilateral Breath Sounds Clear;Diminished;Expiratory wheezes  R Upper  Breath Sounds Expiratory wheezes;Diminished  L Upper Breath Sounds Clear;Diminished  R Lower Breath Sounds Diminished  L Lower Breath Sounds Diminished  Oxygen Therapy/Pulse Ox  O2 Device Nasal Cannula  O2 Therapy Oxygen  O2 Flow Rate (L/min) 4 L/min  FiO2 (%) 36 %

## 2023-04-26 NOTE — ED Provider Notes (Incomplete)
[place] EMERGENCY DEPARTMENT AT [facility] Provider Note   CSN: [id number] Arrival date & time: 04/26/23  1750     History {Add pertinent medical, surgical, social history, OB history to HPI:1} Chief Complaint  Patient presents with  . Chest Pain    Kathleen [name] is a 62 y.o. female.  HPI Patient has history of COPD with interstitial lung disease and sarcoidosis.  Patient reports that she has Addison's disease and is chronically taking steroids.  She reports that she started having some vomiting and has not been able to take her medications today.  She reports that her doctor told her if she misses her medications she should take a second dose but if she still cannot keep them down, that she needs to get the Solu-Cortef.  Patient reports that she has a prescription for Solu-Cortef at the pharmacy to use at home but the pharmacy did not have it and thus they were unable to fill it.  Patient warts that if you had been able to fill it, she would not of come to the emergency department.  Patient reports that when her symptoms get bad like this she gets a lot of anxiety and needs anxiety medication to make her breathing better.    Home Medications Prior to Admission medications   Medication Sig Start Date End Date Taking? Authorizing Provider  albuterol (PROVENTIL) (2.5 MG/3ML) 0.083% nebulizer solution Take 3 mLs (2.5 mg total) by nebulization every 4 (four) hours as needed for wheezing or shortness of breath. 12/15/22 12/15/23  Shon Hale, MD  albuterol (VENTOLIN HFA) 108 (90 Base) MCG/ACT inhaler Inhale 2 puffs into the lungs every 6 (six) hours as needed for wheezing or shortness of breath. 12/15/22   Shon Hale, MD  budesonide (PULMICORT) 0.5 MG/2ML nebulizer solution Inhale the contents of 1 vial via nebulizer two (2) times a day. 11/22/21     busPIRone (BUSPAR) 15 MG tablet Take 1 tablet (15 mg) by mouth 3 times daily. 03/07/23   [name], MD  calcitonin,  salmon, (MIACALCIN) 200 UNIT/ACT nasal spray Use 1 spray in one nostril per day, alternate nostrils every other day (may have 1 vial she should use this for at least 2 weeks but may use as long as 4 weeks) 03/01/23   [name], MD  DULoxetine (CYMBALTA) 60 MG capsule Take 1 capsule (60 mg) by mouth daily. 03/07/23   [name], MD  EPINEPHrine 0.3 mg/0.3 mL IJ SOAJ injection Inject 0.3 mg into the muscle as needed for anaphylaxis. 01/05/22   [name], MD  ferrous sulfate 325 (65 FE) MG EC tablet Take 325 mg by mouth once a week.    [provider]  hydrocortisone (CORTEF) 10 MG tablet Take 2 tablets by mouth at 8:00 am and 1 tablet at lunch. (May double dose on sick days x 3-5 days) 03/28/23   Roma Kayser, MD  hydrOXYzine (ATARAX) 25 MG tablet Take 1 tablet (25 mg total) by mouth 3 (three) times daily as needed for anxiety. 02/26/23 02/26/24  [name], MD  ipratropium-albuterol (DUONEB) 0.5-2.5 (3) MG/3ML SOLN Take 3 mLs by nebulization every 8 (eight) hours. 12/17/22     levothyroxine (SYNTHROID) 50 MCG tablet Take 1 tablet by mouth daily before breakfast. 10/24/22 06/10/23  Roma Kayser, MD  lidocaine (LIDODERM) 5 % Place 1 patch onto the skin daily. Remove & Discard patch within 12 hours or as directed by MD 04/09/23   Sherryll Burger,  Pratik D, DO  lidocaine (LIDODERM) 5 % Place 1 patch onto the skin daily. Remove and discard within 12 hours as directed by MD 04/02/23   Kommor, Wyn Forster, MD  linaclotide [facility]) 290 MCG CAPS capsule Take 1 capsule (290 mcg total) by mouth daily before breakfast. 03/28/22   [name], MD  methylPREDNISolone sodium succinate (SOLU-MEDROL, PF,) 125 MG injection ACT-O-VIAL Administer as directed.  Giving IV is preferred, but can also be given IM if IV access is difficult. 04/24/23   Roma Kayser, MD  naloxone [facility]) nasal spray 4 mg/0.1 mL Use as directed for opioid overdose 12/26/22   [name], MD  ondansetron (ZOFRAN-ODT)  8 MG disintegrating tablet Take 8 mg by mouth every 8 (eight) hours as needed for nausea or vomiting.    [provider]  ondansetron (ZOFRAN-ODT) 8 MG disintegrating tablet Dissolve 1 tablet (8 mg total) by mouth every 8 (eight) hours as needed. 04/23/23 04/22/24  [name], MD  oxyCODONE-acetaminophen (PERCOCET) 10-325 MG tablet Take 1 tablet by mouth every 4 (four) hours as needed for pain. Maximum 6 tablets per day. Do not take with any other form of oxycodone 04/26/23   [name], MD  pantoprazole (PROTONIX) 40 MG tablet Take 1 tablet (40 mg total) by mouth 2 (two) times daily before a meal. 12/15/22 12/15/23  Emokpae, Courage, MD  predniSONE (DELTASONE) 5 MG tablet Take 1 tablet (5 mg total) by mouth in the morning. 07/04/22     rosuvastatin (CRESTOR) 10 MG tablet Take 1 tablet (10 mg total) by mouth daily. 07/03/22   [name], MD  senna-docusate (SENOKOT-S) 8.6-50 MG tablet Take 1 tablet by mouth 2 (two) times daily between meals as needed for mild constipation. 03/13/23   Almon Hercules, MD      Allergies    Bee venom, Contrast media [iodinated contrast media], Bactrim [sulfamethoxazole-trimethoprim], Codeine, and Morphine and codeine    Review of Systems   Review of Systems  Physical Exam Updated Vital Signs BP 139/66   Pulse 95   Temp 98.4 F (36.9 C) (Oral)   Resp 20   Ht 5\' 7"  (1.702 m)   Wt 108.9 kg   LMP 05/17/2016   SpO2 97%   BMI 37.59 kg/m  Physical Exam Constitutional:      Comments: Patient is alert.  She has audible upper airway wheezing.  Morbid obesity.  Speaking in full sentences.  HENT:     Mouth/Throat:     Pharynx: Oropharynx is clear.  Eyes:     Extraocular Movements: Extraocular movements intact.  Cardiovascular:     Rate and Rhythm: Normal rate and regular rhythm.  Pulmonary:     Comments: Mild increased work of breathing.  Patient has forced expiratory wheezing of large upper airways.  Patient has adequate airflow and no  significant wheezing in the mid to lower lung fields. Abdominal:     Comments: Nontender.  Musculoskeletal:     Comments: Patient is wearing a walking boot on the left lower extremity.  No tenderness to the calf or the popliteal fossa.  No significant edema of the legs.  Right calf nontender.  Skin:    General: Skin is warm and dry.  Neurological:     General: No focal deficit present.     Mental Status: She is oriented to person, place, and time.  Psychiatric:     Comments: Patient is anxious and somewhat pressured speech upon first evaluation.  ED Results / Procedures / Treatments   Labs (all labs ordered are listed, but only abnormal results are displayed) Labs Reviewed  BASIC METABOLIC PANEL - Abnormal; Notable for the following components:      Result Value   CO2 34 (*)    Glucose, Bld 126 (*)    Creatinine, Ser 1.11 (*)    GFR, Estimated 57 (*)    All other components within normal limits  CBC - Abnormal; Notable for the following components:   WBC 12.3 (*)    Hemoglobin 11.8 (*)    RDW 15.6 (*)    All other components within normal limits  TROPONIN I (HIGH SENSITIVITY)  TROPONIN I (HIGH SENSITIVITY)    EKG None  Radiology DG Chest Port 1 View  Result Date: 04/26/2023 CLINICAL DATA:  Chest pain EXAM: PORTABLE CHEST 1 VIEW COMPARISON:  04/06/2023 FINDINGS: Study limited by body habitus. Heart difficult to visualize. Mediastinal contours within limits. Increased markings in the lung bases although some of this may be related to overlying pannus. No acute bony abnormality. IMPRESSION: Limited study due to body habitus. Increased markings in the lung bases versus overlying pannus. Electronically Signed   By: Charlett Nose M.D.   On: 04/26/2023 19:47    Procedures Procedures  {Document cardiac monitor, telemetry assessment procedure when appropriate:1}  Medications Ordered in ED Medications  ipratropium-albuterol (DUONEB) 0.5-2.5 (3) MG/3ML nebulizer solution 3 mL  (3 mLs Nebulization Given 04/26/23 2013)  hydrocortisone sodium succinate (SOLU-CORTEF) 100 MG injection 100 mg (100 mg Intravenous Given 04/26/23 2149)  LORazepam (ATIVAN) injection 0.5 mg (0.5 mg Intravenous Given 04/26/23 2149)    ED Course/ Medical Decision Making/ A&P   {   Click here for ABCD2, HEART and other calculatorsREFRESH Note before signing :1}                              Medical Decision Making Amount and/or Complexity of Data Reviewed Labs: ordered. Radiology: ordered.  Risk Prescription drug management.   Patient presents as outlined.  She has severe comorbid conditions of chronic respiratory failure on 4 L oxygen at baseline with combined interstitial lung disease and sarcoidosis.  Patient also has morbid obesity with likely hypoventilation syndrome.  Patient's chief complaint is not tolerating her home steroids and needing Solu-Cortef to avoid precipitating addisonian crisis.  Also she reports having severe anxiety which comes along with this and needing Ativan.  Examination patient is awake and alert, somewhat tearful and hyperventilating.  She has noisy upper airway forced expiratory wheezing but mid and lower air fields have adequate airflow.  Patient was given nebulizer therapy by respiratory prior to my first exam.  Respiratory advised patient stressed that her anxiety was precipitating a lot of the symptoms and that she needed treatment for that as well.  Evaluation includes x-ray and labs.  Chest x-ray reviewed by radiology no acute findings within limitations of study due to body habitus.  Troponin normal.  Basic chemistry sodium 144 potassium 4.5 glucose 126 BUN 17 and creatinine 1.1 GFR 57.  No anion gap. White count 12.3, H&H 11.8 and 38 platelets 364.  Review of EMR indicates patient has had PE studies 7\2\2024 and [phone].  No PE present on either study.  Scarring and bronchiectasis of upper lobes bilaterally, no acute infectious processes but similar chronic  findings of pulmonary fibrosis.  At this time with patient symptoms being typical for her underlying disease, I do not  feel that repeat PE study is indicated.  She is not complaining of chest pain.  Patient is not having increased oxygen requirement.  She is at her baseline 4 L in the setting 100%.  Based on presentation with report of vomiting today and unable to tolerate her oral steroids will treat with 1 dose of Solu-Cortef.  I have reviewed the medication prescribing and patient is taking daily Cortef orally.  Patient was treated with nebulizer therapy upon arrival.  At this time she has adequate airflow through the mid and lower lung fields without evidence of persisting COPD exacerbation.  Patient had multiple requests for Ativan citing that her severe anxiety worsened her respiratory status.  She was given    {Document critical care time when appropriate:1} {Document review of labs and clinical decision tools ie heart score, Chads2Vasc2 etc:1}  {Document your independent review of radiology images, and any outside records:1} {Document your discussion with family members, caretakers, and with consultants:1} {Document social determinants of health affecting pt's care:1} {Document your decision making why or why not admission, treatments were needed:1} Final Clinical Impression(s) / ED Diagnoses Final diagnoses:  Shortness of breath  COPD exacerbation (HCC)  Other chronic pain    Rx / DC Orders ED Discharge Orders     None

## 2023-04-26 NOTE — ED Provider Notes (Signed)
[place] EMERGENCY DEPARTMENT AT [facility] Provider Note   CSN: [id number] Arrival date & time: 04/26/23  1750     History {Add pertinent medical, surgical, social history, OB history to HPI:1} Chief Complaint  Patient presents with   Chest Pain    Kathleen [name] is a 62 y.o. female.  HPI     Home Medications Prior to Admission medications   Medication Sig Start Date End Date Taking? Authorizing Provider  albuterol (PROVENTIL) (2.5 MG/3ML) 0.083% nebulizer solution Take 3 mLs (2.5 mg total) by nebulization every 4 (four) hours as needed for wheezing or shortness of breath. 12/15/22 12/15/23  Shon Hale, MD  albuterol (VENTOLIN HFA) 108 (90 Base) MCG/ACT inhaler Inhale 2 puffs into the lungs every 6 (six) hours as needed for wheezing or shortness of breath. 12/15/22   Shon Hale, MD  budesonide (PULMICORT) 0.5 MG/2ML nebulizer solution Inhale the contents of 1 vial via nebulizer two (2) times a day. 11/22/21     busPIRone (BUSPAR) 15 MG tablet Take 1 tablet (15 mg) by mouth 3 times daily. 03/07/23   [name], MD  calcitonin, salmon, (MIACALCIN) 200 UNIT/ACT nasal spray Use 1 spray in one nostril per day, alternate nostrils every other day (may have 1 vial she should use this for at least 2 weeks but may use as long as 4 weeks) 03/01/23   [name], MD  DULoxetine (CYMBALTA) 60 MG capsule Take 1 capsule (60 mg) by mouth daily. 03/07/23   [name], MD  EPINEPHrine 0.3 mg/0.3 mL IJ SOAJ injection Inject 0.3 mg into the muscle as needed for anaphylaxis. 01/05/22   [name], MD  ferrous sulfate 325 (65 FE) MG EC tablet Take 325 mg by mouth once a week.    [provider]  hydrocortisone (CORTEF) 10 MG tablet Take 2 tablets by mouth at 8:00 am and 1 tablet at lunch. (May double dose on sick days x 3-5 days) 03/28/23   Roma Kayser, MD  hydrOXYzine (ATARAX) 25 MG tablet Take 1 tablet (25 mg total) by mouth 3 (three) times daily as  needed for anxiety. 02/26/23 02/26/24  [name], MD  ipratropium-albuterol (DUONEB) 0.5-2.5 (3) MG/3ML SOLN Take 3 mLs by nebulization every 8 (eight) hours. 12/17/22     levothyroxine (SYNTHROID) 50 MCG tablet Take 1 tablet by mouth daily before breakfast. 10/24/22 06/10/23  Roma Kayser, MD  lidocaine (LIDODERM) 5 % Place 1 patch onto the skin daily. Remove & Discard patch within 12 hours or as directed by MD 04/09/23   Sherryll Burger, Pratik D, DO  lidocaine (LIDODERM) 5 % Place 1 patch onto the skin daily. Remove and discard within 12 hours as directed by MD 04/02/23   Kommor, Wyn Forster, MD  linaclotide [facility]) 290 MCG CAPS capsule Take 1 capsule (290 mcg total) by mouth daily before breakfast. 03/28/22   [name], MD  methylPREDNISolone sodium succinate (SOLU-MEDROL, PF,) 125 MG injection ACT-O-VIAL Administer as directed.  Giving IV is preferred, but can also be given IM if IV access is difficult. 04/24/23   Roma Kayser, MD  naloxone [facility]) nasal spray 4 mg/0.1 mL Use as directed for opioid overdose 12/26/22   [name], MD  ondansetron (ZOFRAN-ODT) 8 MG disintegrating tablet Take 8 mg by mouth every 8 (eight) hours as needed for nausea or vomiting.    [provider]  ondansetron (ZOFRAN-ODT) 8 MG disintegrating tablet Dissolve 1 tablet (8 mg total) by  mouth every 8 (eight) hours as needed. 04/23/23 04/22/24  [name], MD  oxyCODONE-acetaminophen (PERCOCET) 10-325 MG tablet Take 1 tablet by mouth every 4 (four) hours as needed for pain. Maximum 6 tablets per day. Do not take with any other form of oxycodone 04/26/23   [name], MD  pantoprazole (PROTONIX) 40 MG tablet Take 1 tablet (40 mg total) by mouth 2 (two) times daily before a meal. 12/15/22 12/15/23  Emokpae, Courage, MD  predniSONE (DELTASONE) 5 MG tablet Take 1 tablet (5 mg total) by mouth in the morning. 07/04/22     rosuvastatin (CRESTOR) 10 MG tablet Take 1 tablet (10 mg total) by mouth daily. 07/03/22    [name], MD  senna-docusate (SENOKOT-S) 8.6-50 MG tablet Take 1 tablet by mouth 2 (two) times daily between meals as needed for mild constipation. 03/13/23   Almon Hercules, MD      Allergies    Bee venom, Contrast media [iodinated contrast media], Bactrim [sulfamethoxazole-trimethoprim], Codeine, and Morphine and codeine    Review of Systems   Review of Systems  Physical Exam Updated Vital Signs BP (!) 155/83   Pulse 100   Temp 98.1 F (36.7 C) (Oral)   Resp 20   Ht 5\' 7"  (1.702 m)   Wt 108.9 kg   LMP 05/17/2016   SpO2 100%   BMI 37.59 kg/m  Physical Exam  ED Results / Procedures / Treatments   Labs (all labs ordered are listed, but only abnormal results are displayed) Labs Reviewed  BASIC METABOLIC PANEL - Abnormal; Notable for the following components:      Result Value   CO2 34 (*)    Glucose, Bld 126 (*)    Creatinine, Ser 1.11 (*)    GFR, Estimated 57 (*)    All other components within normal limits  CBC - Abnormal; Notable for the following components:   WBC 12.3 (*)    Hemoglobin 11.8 (*)    RDW 15.6 (*)    All other components within normal limits  TROPONIN I (HIGH SENSITIVITY)  TROPONIN I (HIGH SENSITIVITY)    EKG None  Radiology DG Chest Port 1 View  Result Date: 04/26/2023 CLINICAL DATA:  Chest pain EXAM: PORTABLE CHEST 1 VIEW COMPARISON:  04/06/2023 FINDINGS: Study limited by body habitus. Heart difficult to visualize. Mediastinal contours within limits. Increased markings in the lung bases although some of this may be related to overlying pannus. No acute bony abnormality. IMPRESSION: Limited study due to body habitus. Increased markings in the lung bases versus overlying pannus. Electronically Signed   By: Charlett Nose M.D.   On: 04/26/2023 19:47    Procedures Procedures  {Document cardiac monitor, telemetry assessment procedure when appropriate:1}  Medications Ordered in ED Medications  ipratropium-albuterol (DUONEB) 0.5-2.5 (3)  MG/3ML nebulizer solution 3 mL (3 mLs Nebulization Given 04/26/23 2013)    ED Course/ Medical Decision Making/ A&P   {   Click here for ABCD2, HEART and other calculatorsREFRESH Note before signing :1}                              Medical Decision Making Amount and/or Complexity of Data Reviewed Labs: ordered. Radiology: ordered.  Risk Prescription drug management.   ***  {Document critical care time when appropriate:1} {Document review of labs and clinical decision tools ie heart score, Chads2Vasc2 etc:1}  {Document your independent review of radiology images, and any outside records:1} {  Document your discussion with family members, caretakers, and with consultants:1} {Document social determinants of health affecting pt's care:1} {Document your decision making why or why not admission, treatments were needed:1} Final Clinical Impression(s) / ED Diagnoses Final diagnoses:  None    Rx / DC Orders ED Discharge Orders     None

## 2023-04-26 NOTE — ED Triage Notes (Signed)
Pt caox4, c/o CP and N/V x2 hrs, states s/s consistent with adrenal insufficiency. Pt is on home O2 at 4lpm at home.

## 2023-04-27 ENCOUNTER — Other Ambulatory Visit: Payer: Self-pay | Admitting: Family Medicine

## 2023-04-27 ENCOUNTER — Other Ambulatory Visit (HOSPITAL_COMMUNITY): Payer: Self-pay

## 2023-04-30 ENCOUNTER — Other Ambulatory Visit (HOSPITAL_BASED_OUTPATIENT_CLINIC_OR_DEPARTMENT_OTHER): Payer: Self-pay

## 2023-04-30 ENCOUNTER — Other Ambulatory Visit (HOSPITAL_COMMUNITY): Payer: Self-pay

## 2023-04-30 MED ORDER — HYDROXYZINE HCL 25 MG PO TABS
25.0000 mg | ORAL_TABLET | Freq: Three times a day (TID) | ORAL | 1 refills | Status: DC | PRN
  Filled 2023-04-30: qty 90, 30d supply, fill #0
  Filled 2023-06-03: qty 90, 30d supply, fill #1

## 2023-05-01 ENCOUNTER — Other Ambulatory Visit (HOSPITAL_COMMUNITY): Payer: Self-pay

## 2023-05-03 ENCOUNTER — Ambulatory Visit: Payer: Self-pay | Admitting: *Deleted

## 2023-05-03 NOTE — Patient Instructions (Signed)
Visit Information  Thank you for taking time to visit with me today. Please don't hesitate to contact me if I can be of assistance to you.   Following are the goals we discussed today:   Goals Addressed   None     Our next appointment is by telephone on 05/10/23  at 2:30 pm  Please call the care guide team at [phone] if you need to cancel or reschedule your appointment.   If you are experiencing a Mental Health or Behavioral Health Crisis or need someone to talk to, please call the Suicide and Crisis Lifeline: 988 call the [place] National Suicide Prevention Lifeline: 364-537-3186 or TTY: (437)356-8131 TTY 317-202-3354) to talk to a trained counselor call 1-800-273-TALK (toll free, 24 hour hotline) call the [facility]: [phone] call 911   Patient verbalizes understanding of instructions and care plan provided today and agrees to view in MyChart. Active MyChart status and patient understanding of how to access instructions and care plan via MyChart confirmed with patient.     The patient has been provided with contact information for the care management team and has been advised to call with any health related questions or concerns.   Ogle Hoeffner L. [name], RN, BSN, CCM, Care Management Coordinator [phone]

## 2023-05-03 NOTE — Patient Outreach (Signed)
  Care Coordination   Follow Up Visit Note   05/03/2023 Name: TALIKA LORING MRN: [id number] DOB: [birth year]  [name] is a 62 y.o. year old female who sees Luking, Jonna Coup, MD for primary care. I spoke with spouse of  YANIL TROUPE by phone today as he reports she is not feeling good today.  What matters to the patients health and wellness today?  "Stomach problems all day today"   Mr Hose confirms Mrs Nuanez has been taking her oxycodone and has not had a good formed stool. The last stool was today but very minimal.   He agree she may be constipated. He voiced understanding of the need to have a daily bowel elimination plan when using oxycodone. He reports he will have her to be active, take in fluids, take bowel elimination medicine and to eat more fruits & vegetables He voiced understanding of following up with medical providers     Goals Addressed             This Visit's Progress    [facility] care coordination services (pain management,nausea, constipation)   Not on track    Interventions Today    Flowsheet Row Most Recent Value  Chronic Disease   Chronic disease during today's visit Other  [pain medicine, constipation/nausea]  General Interventions   General Interventions Discussed/Reviewed General Interventions Reviewed, Sick Day Rules, Doctor Visits, Communication with  Doctor Visits Discussed/Reviewed Doctor Visits Reviewed, PCP  PCP/Specialist Visits Compliance with follow-up visit  Communication with PCP/Specialists  Exercise Interventions   Exercise Discussed/Reviewed Exercise Reviewed, Physical Activity  [Encouraged activity even in her bed or chair as she should be non weight bearing for her injury]  Education Interventions   Education Provided Provided Web-based Education, Provided Education  [constipation, oxycodone use, nausea]  Provided Verbal Education On Nutrition, Exercise, Medication, Sick Day Rules, When to see the doctor  Mental Health Interventions    Mental Health Discussed/Reviewed Mental Health Reviewed, Coping Strategies  Nutrition Interventions   Nutrition Discussed/Reviewed Nutrition Reviewed, Adding fruits and vegetables, Fluid intake  Pharmacy Interventions   Pharmacy Dicussed/Reviewed Pharmacy Topics Reviewed, Medications and their functions  Safety Interventions   Safety Discussed/Reviewed Safety Reviewed, Fall Risk              SDOH assessments and interventions completed:  No     Care Coordination Interventions:  Yes, provided   Follow up plan: Follow up call scheduled for 05/10/23    Encounter Outcome:  Patient Visit Completed   Cala Bradford L. [name], RN, BSN, CCM, Care Management Coordinator [phone]

## 2023-05-06 ENCOUNTER — Other Ambulatory Visit: Payer: Self-pay | Admitting: "Endocrinology

## 2023-05-06 ENCOUNTER — Ambulatory Visit (INDEPENDENT_AMBULATORY_CARE_PROVIDER_SITE_OTHER): Payer: HMO | Admitting: Family Medicine

## 2023-05-06 ENCOUNTER — Other Ambulatory Visit: Payer: Self-pay

## 2023-05-06 ENCOUNTER — Other Ambulatory Visit (HOSPITAL_COMMUNITY): Payer: Self-pay

## 2023-05-06 VITALS — BP 115/62 | HR 104 | Temp 97.7°F

## 2023-05-06 DIAGNOSIS — S22080G Wedge compression fracture of T11-T12 vertebra, subsequent encounter for fracture with delayed healing: Secondary | ICD-10-CM | POA: Diagnosis not present

## 2023-05-06 DIAGNOSIS — F411 Generalized anxiety disorder: Secondary | ICD-10-CM | POA: Diagnosis not present

## 2023-05-06 DIAGNOSIS — E271 Primary adrenocortical insufficiency: Secondary | ICD-10-CM | POA: Diagnosis not present

## 2023-05-06 DIAGNOSIS — S32009S Unspecified fracture of unspecified lumbar vertebra, sequela: Secondary | ICD-10-CM | POA: Diagnosis not present

## 2023-05-06 DIAGNOSIS — M87052 Idiopathic aseptic necrosis of left femur: Secondary | ICD-10-CM | POA: Diagnosis not present

## 2023-05-06 MED ORDER — DOXYCYCLINE HYCLATE 100 MG PO TABS: 100.0000 mg | ORAL_TABLET | Freq: Two times a day (BID) | ORAL | 0 refills | Status: DC

## 2023-05-06 MED ORDER — OXYCODONE-ACETAMINOPHEN 10-325 MG PO TABS
1.0000 | ORAL_TABLET | ORAL | 0 refills | Status: DC | PRN
  Filled 2023-05-06 – 2023-05-16 (×3): qty 180, 30d supply, fill #0

## 2023-05-06 MED ORDER — BUSPIRONE HCL 15 MG PO TABS
15.0000 mg | ORAL_TABLET | Freq: Three times a day (TID) | ORAL | 4 refills | Status: DC
  Filled 2023-05-06 – 2023-05-13 (×2): qty 90, 30d supply, fill #0
  Filled 2023-06-08: qty 90, 30d supply, fill #1
  Filled 2023-07-03: qty 90, 30d supply, fill #2
  Filled 2023-08-01: qty 90, 30d supply, fill #3
  Filled 2023-08-31: qty 90, 30d supply, fill #4

## 2023-05-06 MED ORDER — DULOXETINE HCL 60 MG PO CPEP
60.0000 mg | ORAL_CAPSULE | Freq: Every day | ORAL | 3 refills | Status: DC
  Filled 2023-05-06 – 2023-05-13 (×3): qty 30, 30d supply, fill #0
  Filled 2023-06-03 – 2023-06-10 (×3): qty 30, 30d supply, fill #1
  Filled 2023-07-03: qty 30, 30d supply, fill #2
  Filled 2023-08-01: qty 30, 30d supply, fill #3

## 2023-05-06 MED ORDER — OXYCODONE-ACETAMINOPHEN 10-325 MG PO TABS
ORAL_TABLET | ORAL | 0 refills | Status: DC
Start: 2023-05-06 — End: 2023-07-11
  Filled 2023-05-06 – 2023-06-14 (×3): qty 180, 30d supply, fill #0

## 2023-05-06 NOTE — Progress Notes (Addendum)
Subjective:    Patient ID: Kathleen [name], female    DOB: [birth year], 62 y.o.   MRN: [id number]  HPI Pt comes in today to discuss compression fracture and chronic pain. No other complaints at this time  This patient was seen today for chronic pain  The medication list was reviewed and updated.   Location of Pain for which the patient has been treated with regarding narcotics: Patient with hip pain from avascular necrosis Patient also with compression fracture pain Left foot pain   Onset of this pain: As for the compression fracture of the foot that is been more recent over the past few months as for the complete avascular necrosis has been present for a few years She is seen multiple orthopedic surgeons none of them want to do hip surgery on her because of her underlying lung issue and immunosuppressants that she is on   -Compliance with medication: Good compliance  - Number patient states they take daily: 6 Percocet 10 mg/325/day  -Reason for ongoing use of opioids patient did not get adequate relief with lower dosing or with Tylenol cannot take NSAIDs  What other measures have been tried outside of opioids Tylenol, comfort measures  In the ongoing specialists regarding this condition orthopedics  -when was the last dose patient took?  Earlier today  The patient was advised the importance of maintaining medication and not using illegal substances with these.  Here for refills and follow up  The patient was educated that we can provide 3 monthly scripts for their medication, it is their responsibility to follow the instructions.  Side effects or complications from medications: Denies side effects denies drowsiness or difficulty breathing  Patient is aware that pain medications are meant to minimize the severity of the pain to allow their pain levels to improve to allow for better function. They are aware of that pain medications cannot totally remove their pain.  Due for  UDT ( at least once per year) (pain management contract is also completed at the time of the UDT): She had 1 in May 2024  Scale of 1 to 10 ( 1 is least 10 is most) Your pain level without the medicine: 10 Your pain level with medication 7  Scale 1 to 10 ( 1-helps very little, 10 helps very well) How well does your pain medication reduce your pain so you can function better through out the day? 5  Quality of the pain: She describes as severe pain in her back and foot on a regular basis and to some degree on her hip when she is trying to walk  Persistence of the pain: Present all the time  Modifying factors: Worse with activity      Review of Systems     Objective:   Physical Exam  General-in no acute distress Eyes-no discharge Lungs-respiratory rate normal, CTA CV-no murmurs,RRR Extremities skin warm dry no edema Neuro grossly normal Behavior normal, alert Patient is in a boot on the left side      Assessment & Plan:   Left ankle fracture-it is taking some time to heal out which is causing issues Has chronic pain and discomfort which is created a very difficult issue Her pain medicine she does not feel adequately takes care of her pain but she is at the maximum that we can prescribe.  We will stick with the 10 mg oxycodone no more than 6/day Her pill count is accurate Her urine drug screen is up-to-date I have  impressed upon her that we cannot do benzodiazepines We will stick with BuSpar in the very end of depressive Patient defers on counseling I tried to give her encouragement about physical activity healthy eating especially once she gets to the point where she is allowed to bear weight on her ankle She will follow-up with [place] in approximately 6  to 10 weeks follow-up sooner if any problems  She also has a significant issue with adrenal insufficiency every now and then she gets overwhelmed anxious nauseous to the point she does not keep her medications down then  causes her to have multiple vomiting then she gets dehydrated she has Solu-Medrol through Dr. Fransico Him but needs refills we will connect with him to have him send in refills I have encouraged the patient that this emergent use of Solu-Medrol is not something she can do on a frequent basis hopefully her symptoms will stay fairly well-controlled  Patient is very nice but she is overwhelmed by the amount of health issues that are going on.  She states she does not feel that counseling would be of any help.  She is not interested in doing any counseling currently.  She does not feel she will harm herself.  She is pretty much bedbound or wheelchair-bound. As for her lung condition it is stable yet she is on oxygen and is at risk of having negative outcomes should she have severe infection Adrenal gland insufficiency-follow through with specialist  I believe that depression adds to the level of pain she is having but we have her on an antidepressant and BuSpar she is not a good candidate for benzodiazepines.  I have previously talked with her about accidental overdoses, I have also instructed her and her husband to have Narcan on hand that if she was excessively drowsy to use Narcan and called 911  We did do a pill count today and the pill count was accurate  2 additional prescription sent and she will follow-up within 6 weeks  It is very difficult to find a pain management willing to accept new patients we are trying to help her by setting her up with pain management

## 2023-05-07 ENCOUNTER — Other Ambulatory Visit (HOSPITAL_COMMUNITY): Payer: Self-pay

## 2023-05-09 ENCOUNTER — Telehealth: Payer: Self-pay | Admitting: Orthopedic Surgery

## 2023-05-09 ENCOUNTER — Other Ambulatory Visit (HOSPITAL_COMMUNITY): Payer: Self-pay

## 2023-05-09 ENCOUNTER — Other Ambulatory Visit: Payer: Self-pay | Admitting: "Endocrinology

## 2023-05-09 NOTE — Telephone Encounter (Signed)
Scheduled her to come in @ 3pm Monday 05/13/23

## 2023-05-09 NOTE — Telephone Encounter (Signed)
Patient called and said that she fell on her right foot and it needs to be looked at. She wants to know what you can do?[phone]

## 2023-05-10 ENCOUNTER — Other Ambulatory Visit: Payer: Self-pay | Admitting: "Endocrinology

## 2023-05-10 ENCOUNTER — Other Ambulatory Visit (HOSPITAL_COMMUNITY): Payer: Self-pay

## 2023-05-10 ENCOUNTER — Ambulatory Visit: Payer: Self-pay | Admitting: *Deleted

## 2023-05-10 NOTE — Patient Outreach (Signed)
  Care Coordination   Follow Up Visit Note   11/25/2023 updated note for 05/10/23 Name: Kathleen [name] MRN: [id number] DOB: November 25, 1960  Kathleen [name] is a 62 y.o. year old female who sees Luking, Jonna Coup, MD for primary care. I spoke with  Kathleen [name] by phone today.  What matters to the patients health and wellness today?  Bowel elimination Abdominal pain Sweating bad this morning, no elevated temperature or chills   Will try recommended over the counter treatments for constipation    Goals Addressed             This Visit's Progress    abdominal/bowel pain, nausea, constipation- [facility] care coordination services       Interventions Today    Flowsheet Row Most Recent Value  General Interventions   General Interventions Discussed/Reviewed General Interventions Reviewed, Doctor Visits  [bowel elimination & abdominal pain]  Doctor Visits Discussed/Reviewed Doctor Visits Reviewed, PCP, Specialist  PCP/Specialist Visits Compliance with follow-up visit  Education Interventions   Education Provided Provided Education  Provided Verbal Education On Nutrition, Sick Day Rules, Other  Nutrition Interventions   Nutrition Discussed/Reviewed Nutrition Reviewed, Fluid intake  Pharmacy Interventions   Pharmacy Dicussed/Reviewed Pharmacy Topics Reviewed, Affording Medications              SDOH assessments and interventions completed:  No     Care Coordination Interventions:  Yes, provided   Follow up plan: No further intervention required.   Encounter Outcome:  Patient Visit Completed   Kathleen Bradford L. [name], RN, BSN, CCM   [facility] RN Care Manager Direct Dial: [phone]  Fax: [phone]

## 2023-05-11 ENCOUNTER — Other Ambulatory Visit (HOSPITAL_COMMUNITY): Payer: Self-pay

## 2023-05-13 ENCOUNTER — Other Ambulatory Visit (INDEPENDENT_AMBULATORY_CARE_PROVIDER_SITE_OTHER): Payer: HMO

## 2023-05-13 ENCOUNTER — Other Ambulatory Visit: Payer: Self-pay

## 2023-05-13 ENCOUNTER — Ambulatory Visit (INDEPENDENT_AMBULATORY_CARE_PROVIDER_SITE_OTHER): Payer: HMO | Admitting: Orthopedic Surgery

## 2023-05-13 ENCOUNTER — Other Ambulatory Visit (HOSPITAL_COMMUNITY): Payer: Self-pay

## 2023-05-13 ENCOUNTER — Encounter (HOSPITAL_COMMUNITY): Payer: Self-pay

## 2023-05-13 DIAGNOSIS — S82892D Other fracture of left lower leg, subsequent encounter for closed fracture with routine healing: Secondary | ICD-10-CM

## 2023-05-13 MED ORDER — SOLU-MEDROL (PF) 125 MG IJ SOLR
INTRAMUSCULAR | 0 refills | Status: DC
Start: 2023-05-13 — End: 2023-06-03
  Filled 2023-05-13: qty 1, 1d supply, fill #0

## 2023-05-13 NOTE — Progress Notes (Signed)
Orthopedic Surgery Post-operative Office Visit   Procedure: Left ankle fracture ORIF Date of Surgery: 03/10/2023 (~8 weeks post-op)   Assessment: Patient is a 62 y.o. who showed slight interval displacement of the lateral malleolus fracture since last visit after a fall     Plan: -No acute operative intervention planned at this time -I told her that I notice a slight increase in translation of her fracture which would put her at increased risk of post traumatic arthritis. I do not think that it has translated enough to warrant revision surgery. She was out of her boot when this happened and elected to come out of the cast at our last visit. A cast would have been more rigid and provided more stability. I am confident I would be able to protect her with a cast after any kind of revision surgery  -I reiterated that she should stay strictly nonweightbearing until her next office visit -Will plan to allow for weight bearing in a boot at our next visit -Return to office in 4 weeks, x-rays needed at next visit: AP/lateral/mortise out of boot   ___________________________________________________________________________     Subjective: Patient had a fall when getting out of the bath tub and fell onto the floor. This was within the last week. She was doing fine until that fall. She noted acute worsening of her left ankle pain. The pain has gotten better with time. She is not having any pain in the ankle at this time. Denies paresthesias and numbness. Said she has otherwise been non-weight bearing.    Objective:   General: no acute distress, appropriate affect Neurologic: alert, answering questions appropriately, following commands Respiratory: unlabored breathing on room air Skin: Incisions over the medial and lateral aspect of the ankle are well-healed   MSK (LLE): Dorsiflexion to neutral, plantarflexion to 20 degrees, no pain through active ankle range of motion, dorsiflexes and plantarflexes  toes, sensation intact to light touch in sural/saphenous/deep peroneal/superficial peroneal/tibial nerve distributions, foot warm and well-perfused     Imaging: X-rays of the left ankle taken 05/13/2023 were independently reviewed and interpreted, showing similar displacement of her medial malleolus. Slight increase in displacement of the lateral malleolus fracture since films on 04/18/2023. Slight translation of the talus laterally. No lucency is seen around the screws.  Screws have not backed out.  No new fractures seen.      Patient name: Kathleen [name] Patient MRN: [id number] Date of visit: 05/13/23

## 2023-05-14 ENCOUNTER — Other Ambulatory Visit (HOSPITAL_COMMUNITY): Payer: Self-pay

## 2023-05-16 ENCOUNTER — Other Ambulatory Visit (HOSPITAL_COMMUNITY): Payer: Self-pay

## 2023-05-17 ENCOUNTER — Other Ambulatory Visit (HOSPITAL_COMMUNITY): Payer: Self-pay

## 2023-05-20 ENCOUNTER — Encounter (HOSPITAL_BASED_OUTPATIENT_CLINIC_OR_DEPARTMENT_OTHER): Payer: Self-pay

## 2023-05-20 ENCOUNTER — Emergency Department (HOSPITAL_BASED_OUTPATIENT_CLINIC_OR_DEPARTMENT_OTHER): Payer: HMO

## 2023-05-20 ENCOUNTER — Emergency Department (HOSPITAL_BASED_OUTPATIENT_CLINIC_OR_DEPARTMENT_OTHER)
Admission: EM | Admit: 2023-05-20 | Discharge: 2023-05-21 | Disposition: A | Discharge status: Home / Self Care | Payer: HMO | Attending: Emergency Medicine | Admitting: Emergency Medicine

## 2023-05-20 DIAGNOSIS — R939 Diagnostic imaging inconclusive due to excess body fat of patient: Secondary | ICD-10-CM | POA: Diagnosis not present

## 2023-05-20 DIAGNOSIS — Z20822 Contact with and (suspected) exposure to covid-19: Secondary | ICD-10-CM | POA: Insufficient documentation

## 2023-05-20 DIAGNOSIS — I10 Essential (primary) hypertension: Secondary | ICD-10-CM | POA: Diagnosis not present

## 2023-05-20 DIAGNOSIS — J929 Pleural plaque without asbestos: Secondary | ICD-10-CM | POA: Diagnosis not present

## 2023-05-20 DIAGNOSIS — R0602 Shortness of breath: Secondary | ICD-10-CM | POA: Diagnosis not present

## 2023-05-20 LAB — CBC WITH DIFFERENTIAL/PLATELET
Abs Immature Granulocytes: 0.24 K/uL — ABNORMAL HIGH (ref 0.00–0.07)
Basophils Absolute: 0.1 K/uL (ref 0.0–0.1)
Basophils Relative: 1 %
Eosinophils Absolute: 0.3 K/uL (ref 0.0–0.5)
Eosinophils Relative: 3 %
HCT: 32.3 % — ABNORMAL LOW (ref 36.0–46.0)
Hemoglobin: 10.1 g/dL — ABNORMAL LOW (ref 12.0–15.0)
Immature Granulocytes: 2 %
Lymphocytes Relative: 12 %
Lymphs Abs: 1.4 K/uL (ref 0.7–4.0)
MCH: 28.5 pg (ref 26.0–34.0)
MCHC: 31.3 g/dL (ref 30.0–36.0)
MCV: 91.2 fL (ref 80.0–100.0)
Monocytes Absolute: 0.9 K/uL (ref 0.1–1.0)
Monocytes Relative: 8 %
Neutro Abs: 8.4 K/uL — ABNORMAL HIGH (ref 1.7–7.7)
Neutrophils Relative %: 74 %
Platelets: 398 K/uL (ref 150–400)
RBC: 3.54 MIL/uL — ABNORMAL LOW (ref 3.87–5.11)
RDW: 16.5 % — ABNORMAL HIGH (ref 11.5–15.5)
WBC: 11.3 K/uL — ABNORMAL HIGH (ref 4.0–10.5)
nRBC: 0.3 % — ABNORMAL HIGH (ref 0.0–0.2)

## 2023-05-20 LAB — COMPREHENSIVE METABOLIC PANEL
ALT: 14 U/L (ref 0–44)
AST: 11 U/L — ABNORMAL LOW (ref 15–41)
Albumin: 3.8 g/dL (ref 3.5–5.0)
Alkaline Phosphatase: 74 U/L (ref 38–126)
Anion gap: 8 (ref 5–15)
BUN: 16 mg/dL (ref 6–20)
CO2: 34 mmol/L — ABNORMAL HIGH (ref 22–32)
Calcium: 9.3 mg/dL (ref 8.9–10.3)
Chloride: 98 mmol/L (ref 98–111)
Creatinine, Ser: 0.73 mg/dL (ref 0.44–1.00)
GFR, Estimated: >60 mL/min (ref >60)
Glucose, Bld: 123 mg/dL — ABNORMAL HIGH (ref 70–99)
Potassium: 3.9 mmol/L (ref 3.5–5.1)
Sodium: 140 mmol/L (ref 135–145)
Total Bilirubin: 0.2 mg/dL — ABNORMAL LOW (ref 0.3–1.2)
Total Protein: 6.1 g/dL — ABNORMAL LOW (ref 6.5–8.1)

## 2023-05-20 LAB — RESP PANEL BY RT-PCR (RSV, FLU A&B, COVID)  RVPGX2
Influenza A by PCR: NEGATIVE
Influenza B by PCR: NEGATIVE
Resp Syncytial Virus by PCR: NEGATIVE
SARS Coronavirus 2 by RT PCR: NEGATIVE

## 2023-05-20 MED ORDER — IPRATROPIUM BROMIDE 0.02 % IN SOLN
0.5000 mg | Freq: Once | RESPIRATORY_TRACT | Status: AC
  Administered 2023-05-20: 0.5 mg via RESPIRATORY_TRACT
  Filled 2023-05-20: qty 2.5

## 2023-05-20 MED ORDER — METHYLPREDNISOLONE SODIUM SUCC 125 MG IJ SOLR
125.0000 mg | Freq: Once | INTRAMUSCULAR | Status: AC
  Administered 2023-05-20: 125 mg via INTRAVENOUS
  Filled 2023-05-20: qty 2

## 2023-05-20 MED ORDER — ALBUTEROL SULFATE (2.5 MG/3ML) 0.083% IN NEBU
5.0000 mg | INHALATION_SOLUTION | Freq: Once | RESPIRATORY_TRACT | Status: AC
  Administered 2023-05-20: 5 mg via RESPIRATORY_TRACT

## 2023-05-20 MED ORDER — LORAZEPAM 1 MG PO TABS
1.0000 mg | ORAL_TABLET | Freq: Once | ORAL | Status: AC
  Administered 2023-05-21: 1 mg via ORAL
  Filled 2023-05-20: qty 1

## 2023-05-20 MED ORDER — ALBUTEROL SULFATE (2.5 MG/3ML) 0.083% IN NEBU
2.5000 mg | INHALATION_SOLUTION | Freq: Once | RESPIRATORY_TRACT | Status: DC
  Filled 2023-05-20: qty 3

## 2023-05-20 NOTE — ED Triage Notes (Signed)
Pt states she started feeling SHOB 6pm Pt is on home oxygen 4L Diminished lung sounds  Upper airway wheezy States she has pain under her left breast/rib area and towards back

## 2023-05-20 NOTE — ED Notes (Signed)
Patient unaware of time of last albuterol use.

## 2023-05-20 NOTE — ED Provider Notes (Signed)
[place] EMERGENCY DEPARTMENT AT [facility] Provider Note   CSN: [id number] Arrival date & time: 05/20/23  2020     History {Add pertinent medical, surgical, social history, OB history to HPI:1} Chief Complaint  Patient presents with   Shortness of Breath    Kathleen [name] is a 62 y.o. female.  With a history of pulmonary fibrosis and sarcoidosis who presents to the emergency department for shortness of breath.   Shortness of Breath      Home Medications Prior to Admission medications   Medication Sig Start Date End Date Taking? Authorizing Provider  albuterol (PROVENTIL) (2.5 MG/3ML) 0.083% nebulizer solution Take 3 mLs (2.5 mg total) by nebulization every 4 (four) hours as needed for wheezing or shortness of breath. 12/15/22 12/15/23  Shon Hale, MD  albuterol (VENTOLIN HFA) 108 (90 Base) MCG/ACT inhaler Inhale 2 puffs into the lungs every 6 (six) hours as needed for wheezing or shortness of breath. 12/15/22   Shon Hale, MD  budesonide (PULMICORT) 0.5 MG/2ML nebulizer solution Inhale the contents of 1 vial via nebulizer two (2) times a day. 11/22/21     busPIRone (BUSPAR) 15 MG tablet Take 1 tablet (15 mg) by mouth 3 times daily. 05/06/23   [name], MD  calcitonin, salmon, (MIACALCIN) 200 UNIT/ACT nasal spray Use 1 spray in one nostril per day, alternate nostrils every other day (may have 1 vial she should use this for at least 2 weeks but may use as long as 4 weeks) 03/01/23   Gerda Diss, Jonna Coup, MD  doxycycline (VIBRA-TABS) 100 MG tablet Take 1 tablet (100 mg total) by mouth 2 (two) times daily. 05/06/23   [name], MD  DULoxetine (CYMBALTA) 60 MG capsule Take 1 capsule (60 mg) by mouth daily. 05/06/23   [name], MD  EPINEPHrine 0.3 mg/0.3 mL IJ SOAJ injection Inject 0.3 mg into the muscle as needed for anaphylaxis. 01/05/22   [name], MD  ferrous sulfate 325 (65 FE) MG EC tablet Take 325 mg by mouth once a week.    [provider]  hydrocortisone (CORTEF) 10 MG tablet Take 2 tablets by mouth at 8:00 am and 1 tablet at lunch. (May double dose on sick days x 3-5 days) 03/28/23   Roma Kayser, MD  hydrOXYzine (ATARAX) 25 MG tablet Take 1 tablet (25 mg total) by mouth 3 (three) times daily as needed for anxiety. 04/30/23 04/29/24  [name], MD  ipratropium-albuterol (DUONEB) 0.5-2.5 (3) MG/3ML SOLN Take 3 mLs by nebulization every 8 (eight) hours. 12/17/22     levothyroxine (SYNTHROID) 50 MCG tablet Take 1 tablet by mouth daily before breakfast. 10/24/22 06/10/23  Roma Kayser, MD  lidocaine (LIDODERM) 5 % Place 1 patch onto the skin daily. Remove & Discard patch within 12 hours or as directed by MD 04/09/23   Sherryll Burger, Pratik D, DO  lidocaine (LIDODERM) 5 % Place 1 patch onto the skin daily. Remove and discard within 12 hours as directed by MD 04/02/23   Kommor, Wyn Forster, MD  linaclotide [facility]) 290 MCG CAPS capsule Take 1 capsule (290 mcg total) by mouth daily before breakfast. 03/28/22   [name], MD  methylPREDNISolone sodium succinate (SOLU-MEDROL, PF,) 125 MG injection ACT-O-VIAL Administer as directed.  Giving IV is preferred, but can also be given IM if IV access is difficult. 05/13/23   Roma Kayser, MD  naloxone [facility]) nasal spray 4 mg/0.1 mL Use as directed for opioid overdose  12/26/22   [name], MD  ondansetron (ZOFRAN-ODT) 8 MG disintegrating tablet Take 8 mg by mouth every 8 (eight) hours as needed for nausea or vomiting.    [provider]  ondansetron (ZOFRAN-ODT) 8 MG disintegrating tablet Dissolve 1 tablet (8 mg total) by mouth every 8 (eight) hours as needed. 04/23/23 04/22/24  [name], MD  oxyCODONE-acetaminophen (PERCOCET) 10-325 MG tablet Take 1 tablet by mouth every 4 (four) hours as needed for pain. Maximum 6 tablets per day. Do not take with any other form of oxycodone 05/16/23 05/06/23   [name], MD  oxyCODONE-acetaminophen (PERCOCET) 10-325 MG tablet  Take 1 tablet every 4 hours as needed pain maximum 6/day 06/14/23 05/06/23   [name], MD  pantoprazole (PROTONIX) 40 MG tablet Take 1 tablet (40 mg total) by mouth 2 (two) times daily before a meal. 12/15/22 12/15/23  Emokpae, Courage, MD  predniSONE (DELTASONE) 5 MG tablet Take 1 tablet (5 mg total) by mouth in the morning. 07/04/22     rosuvastatin (CRESTOR) 10 MG tablet Take 1 tablet (10 mg total) by mouth daily. 07/03/22   [name], MD  senna-docusate (SENOKOT-S) 8.6-50 MG tablet Take 1 tablet by mouth 2 (two) times daily between meals as needed for mild constipation. 03/13/23   Almon Hercules, MD      Allergies    Bee venom, Contrast media [iodinated contrast media], Bactrim [sulfamethoxazole-trimethoprim], Codeine, and Morphine and codeine    Review of Systems   Review of Systems  Respiratory:  Positive for shortness of breath.     Physical Exam Updated Vital Signs BP 132/66   Pulse 92   Temp 98.7 F (37.1 C)   Resp (!) 22   Ht 5\' 10"  (1.778 m)   Wt 108.9 kg   LMP 05/17/2016   SpO2 97%   BMI 34.44 kg/m  Physical Exam  ED Results / Procedures / Treatments   Labs (all labs ordered are listed, but only abnormal results are displayed) Labs Reviewed  CBC WITH DIFFERENTIAL/PLATELET - Abnormal; Notable for the following components:      Result Value   WBC 11.3 (*)    RBC 3.54 (*)    Hemoglobin 10.1 (*)    HCT 32.3 (*)    RDW 16.5 (*)    nRBC 0.3 (*)    Neutro Abs 8.4 (*)    Abs Immature Granulocytes 0.24 (*)    All other components within normal limits  COMPREHENSIVE METABOLIC PANEL - Abnormal; Notable for the following components:   CO2 34 (*)    Glucose, Bld 123 (*)    Total Protein 6.1 (*)    AST 11 (*)    Total Bilirubin 0.2 (*)    All other components within normal limits  RESP PANEL BY RT-PCR (RSV, FLU A&B, COVID)  RVPGX2    EKG None  Radiology DG Chest Portable 1 View  Result Date: 05/20/2023 CLINICAL DATA:  Shortness of breath. History of  sarcoid, hypertension, and COPD. EXAM: PORTABLE CHEST 1 VIEW COMPARISON:  04/26/2023. FINDINGS: The cardiac silhouette and the mediastinal contour are stable. Apical pleural thickening is present bilaterally and unchanged. Examination is limited due to patient's body habitus. Lung volumes are low. Chronic interstitial markings are noted bilaterally. No consolidation, effusion, or pneumothorax. No acute osseous abnormality. IMPRESSION: Stable chest with no active disease. Electronically Signed   By: Thornell Sartorius M.D.   On: 05/20/2023 22:18    Procedures Procedures  {Document cardiac  monitor, telemetry assessment procedure when appropriate:1}  Medications Ordered in ED Medications  LORazepam (ATIVAN) tablet 1 mg (has no administration in time range)  ipratropium (ATROVENT) nebulizer solution 0.5 mg (0.5 mg Nebulization Given 05/20/23 2044)  albuterol (PROVENTIL) (2.5 MG/3ML) 0.083% nebulizer solution 5 mg (5 mg Nebulization Given 05/20/23 2048)  methylPREDNISolone sodium succinate (SOLU-MEDROL) 125 mg/2 mL injection 125 mg (125 mg Intravenous Given 05/20/23 2243)    ED Course/ Medical Decision Making/ A&P   {   Click here for ABCD2, HEART and other calculatorsREFRESH Note before signing :1}                              Medical Decision Making Amount and/or Complexity of Data Reviewed Labs: ordered. Radiology: ordered.  Risk Prescription drug management.   ***  {Document critical care time when appropriate:1} {Document review of labs and clinical decision tools ie heart score, Chads2Vasc2 etc:1}  {Document your independent review of radiology images, and any outside records:1} {Document your discussion with family members, caretakers, and with consultants:1} {Document social determinants of health affecting pt's care:1} {Document your decision making why or why not admission, treatments were needed:1} Final Clinical Impression(s) / ED Diagnoses Final diagnoses:  None    Rx / DC  Orders ED Discharge Orders     None

## 2023-05-21 NOTE — Discharge Instructions (Signed)
You were seen in the emerged part for shortness of breath We gave you a breathing treatment and steroids here which seem to help Your chest x-ray and blood work all looked okay It is important that you follow-up with your primary doctor for reevaluation within the next week Return to the emergency department for chest pain, shortness of breath or any other concerns

## 2023-05-22 ENCOUNTER — Other Ambulatory Visit (HOSPITAL_COMMUNITY): Payer: Self-pay

## 2023-05-23 ENCOUNTER — Other Ambulatory Visit (HOSPITAL_COMMUNITY): Payer: Self-pay

## 2023-05-27 ENCOUNTER — Telehealth: Payer: Self-pay | Admitting: Orthopedic Surgery

## 2023-05-27 NOTE — Telephone Encounter (Signed)
Pt fell transferring from wheelchair to bed sprained surgical foot, some bruising and pain is still normal. Pt stated to [facility] she fell Thursday or Friday has not been seen about it please advise, HHPT is unsure when she can become weight baring. Tacey Ruiz [phone]

## 2023-05-29 NOTE — Telephone Encounter (Signed)
Tried calling Kathleen [name] but her VM is full, I will try again tomorrow

## 2023-05-30 NOTE — Telephone Encounter (Signed)
I tried to call again but the VM is full.--I will try to call again tomorrow.

## 2023-05-31 NOTE — Telephone Encounter (Signed)
Lmom for Kathleen [name] to call us back.

## 2023-06-01 ENCOUNTER — Emergency Department (HOSPITAL_COMMUNITY): Payer: HMO

## 2023-06-01 ENCOUNTER — Emergency Department (HOSPITAL_COMMUNITY)
Admission: EM | Admit: 2023-06-01 | Discharge: 2023-06-02 | Disposition: A | Discharge status: Home / Self Care | Payer: HMO | Attending: Emergency Medicine | Admitting: Emergency Medicine

## 2023-06-01 ENCOUNTER — Other Ambulatory Visit: Payer: Self-pay

## 2023-06-01 ENCOUNTER — Encounter (HOSPITAL_COMMUNITY): Payer: Self-pay

## 2023-06-01 DIAGNOSIS — R0602 Shortness of breath: Secondary | ICD-10-CM | POA: Insufficient documentation

## 2023-06-01 DIAGNOSIS — I10 Essential (primary) hypertension: Secondary | ICD-10-CM | POA: Diagnosis not present

## 2023-06-01 DIAGNOSIS — R112 Nausea with vomiting, unspecified: Secondary | ICD-10-CM | POA: Diagnosis not present

## 2023-06-01 DIAGNOSIS — R918 Other nonspecific abnormal finding of lung field: Secondary | ICD-10-CM | POA: Diagnosis not present

## 2023-06-01 DIAGNOSIS — J449 Chronic obstructive pulmonary disease, unspecified: Secondary | ICD-10-CM | POA: Diagnosis not present

## 2023-06-01 DIAGNOSIS — R079 Chest pain, unspecified: Secondary | ICD-10-CM | POA: Insufficient documentation

## 2023-06-01 DIAGNOSIS — R0789 Other chest pain: Secondary | ICD-10-CM | POA: Diagnosis not present

## 2023-06-01 DIAGNOSIS — D72829 Elevated white blood cell count, unspecified: Secondary | ICD-10-CM | POA: Diagnosis not present

## 2023-06-01 DIAGNOSIS — E039 Hypothyroidism, unspecified: Secondary | ICD-10-CM | POA: Insufficient documentation

## 2023-06-01 DIAGNOSIS — E1165 Type 2 diabetes mellitus with hyperglycemia: Secondary | ICD-10-CM | POA: Insufficient documentation

## 2023-06-01 DIAGNOSIS — G4489 Other headache syndrome: Secondary | ICD-10-CM | POA: Diagnosis not present

## 2023-06-01 MED ORDER — ALBUTEROL SULFATE HFA 108 (90 BASE) MCG/ACT IN AERS: 2.0000 | INHALATION_SPRAY | RESPIRATORY_TRACT | Status: DC | PRN

## 2023-06-01 MED ORDER — IPRATROPIUM-ALBUTEROL 0.5-2.5 (3) MG/3ML IN SOLN
3.0000 mL | Freq: Once | RESPIRATORY_TRACT | Status: AC
  Administered 2023-06-01: 3 mL via RESPIRATORY_TRACT
  Filled 2023-06-01: qty 3

## 2023-06-01 NOTE — ED Triage Notes (Signed)
Pt. BIB [facility] for SOB x1 day. Pt. Has been vomiting all day as well. Pt. Took hydrocortisone and zofran earlier, but has not been able to keep it done. Pt. Has had 125 solumedrol and 4mg  zofran. Pt. C/o chest pain. Refused aspirin and nitroglycerin.

## 2023-06-02 LAB — COMPREHENSIVE METABOLIC PANEL
ALT: 16 U/L (ref 0–44)
AST: 18 U/L (ref 15–41)
Albumin: 3.5 g/dL (ref 3.5–5.0)
Alkaline Phosphatase: 70 U/L (ref 38–126)
Anion gap: 8 (ref 5–15)
BUN: 12 mg/dL (ref 6–20)
CO2: 32 mmol/L (ref 22–32)
Calcium: 8.6 mg/dL — ABNORMAL LOW (ref 8.9–10.3)
Chloride: 98 mmol/L (ref 98–111)
Creatinine, Ser: 0.76 mg/dL (ref 0.44–1.00)
GFR, Estimated: >60 mL/min (ref >60)
Glucose, Bld: 127 mg/dL — ABNORMAL HIGH (ref 70–99)
Potassium: 4.2 mmol/L (ref 3.5–5.1)
Sodium: 138 mmol/L (ref 135–145)
Total Bilirubin: 0.8 mg/dL (ref 0.3–1.2)
Total Protein: 6.7 g/dL (ref 6.5–8.1)

## 2023-06-02 LAB — URINALYSIS, W/ REFLEX TO CULTURE (INFECTION SUSPECTED)
Bacteria, UA: NONE SEEN
Bilirubin Urine: NEGATIVE
Glucose, UA: NEGATIVE mg/dL
Hgb urine dipstick: NEGATIVE
Ketones, ur: NEGATIVE mg/dL
Nitrite: NEGATIVE
Protein, ur: NEGATIVE mg/dL
Specific Gravity, Urine: 1.009 (ref 1.005–1.030)
pH: 8 (ref 5.0–8.0)

## 2023-06-02 LAB — CBC WITH DIFFERENTIAL/PLATELET
Abs Immature Granulocytes: 0.2 10*3/uL — ABNORMAL HIGH (ref 0.00–0.07)
Basophils Absolute: 0.1 10*3/uL (ref 0.0–0.1)
Basophils Relative: 0 %
Eosinophils Absolute: 0.1 10*3/uL (ref 0.0–0.5)
Eosinophils Relative: 1 %
HCT: 35.5 % — ABNORMAL LOW (ref 36.0–46.0)
Hemoglobin: 10.7 g/dL — ABNORMAL LOW (ref 12.0–15.0)
Immature Granulocytes: 2 %
Lymphocytes Relative: 7 %
Lymphs Abs: 0.9 10*3/uL (ref 0.7–4.0)
MCH: 28.5 pg (ref 26.0–34.0)
MCHC: 30.1 g/dL (ref 30.0–36.0)
MCV: 94.7 fL (ref 80.0–100.0)
Monocytes Absolute: 0.4 10*3/uL (ref 0.1–1.0)
Monocytes Relative: 3 %
Neutro Abs: 10.6 10*3/uL — ABNORMAL HIGH (ref 1.7–7.7)
Neutrophils Relative %: 87 %
Platelets: 439 10*3/uL — ABNORMAL HIGH (ref 150–400)
RBC: 3.75 MIL/uL — ABNORMAL LOW (ref 3.87–5.11)
RDW: 15.6 % — ABNORMAL HIGH (ref 11.5–15.5)
WBC: 12.1 10*3/uL — ABNORMAL HIGH (ref 4.0–10.5)
nRBC: 0 % (ref 0.0–0.2)

## 2023-06-02 LAB — T4, FREE: Free T4: 1.02 ng/dL (ref 0.61–1.12)

## 2023-06-02 LAB — TSH: TSH: 0.661 u[IU]/mL (ref 0.350–4.500)

## 2023-06-02 LAB — LIPASE, BLOOD: Lipase: 26 U/L (ref 11–51)

## 2023-06-02 MED ORDER — SODIUM CHLORIDE 0.9 % IV BOLUS
1000.0000 mL | Freq: Once | INTRAVENOUS | Status: AC
  Administered 2023-06-02: 1000 mL via INTRAVENOUS

## 2023-06-02 MED ORDER — HYDROCORTISONE 20 MG PO TABS
20.0000 mg | ORAL_TABLET | Freq: Once | ORAL | Status: AC
  Administered 2023-06-02: 20 mg via ORAL
  Filled 2023-06-02: qty 1

## 2023-06-02 MED ORDER — PROCHLORPERAZINE EDISYLATE 10 MG/2ML IJ SOLN
10.0000 mg | Freq: Once | INTRAMUSCULAR | Status: AC
  Administered 2023-06-02: 10 mg via INTRAVENOUS
  Filled 2023-06-02: qty 2

## 2023-06-02 MED ORDER — PROCHLORPERAZINE 25 MG RE SUPP: 25.0000 mg | Freq: Two times a day (BID) | RECTAL | 0 refills | Status: DC | PRN

## 2023-06-02 NOTE — ED Provider Notes (Signed)
[place] EMERGENCY DEPARTMENT AT [facility] Provider Note  CSN: [id number] Arrival date & time: 06/01/23 2305  Chief Complaint(s) Shortness of Breath  HPI Kathleen [name] is a 62 y.o. female {Add pertinent medical, surgical, social history, OB history to HPI:1}    Shortness of Breath   Past Medical History Past Medical History:  Diagnosis Date  . Adrenal insufficiency (HCC)   . Anemia   . Anxiety   . Avascular bone necrosis (HCC)   . Breast fibrocystic disorder   . Chronic pain   . COPD (chronic obstructive pulmonary disease) (HCC)   . Encounter for long-term opiate analgesic use 02/02/2020   Patient has chronic pain with femoral head avascular necrosis.  Is under pain contract  . Fibromyalgia   . Gastroesophageal reflux disease   . Hypothyroidism   . Mitral valve prolapse   . Orthostatic hypotension   . Peripheral neuropathy   . PONV (postoperative nausea and vomiting)   . Restrictive lung disease    Moderate to severe  . Sarcoidosis    Biopsy proven - [facility]  . SOB (shortness of breath)    chronic   Patient Active Problem List   Diagnosis Date Noted  . COPD exacerbation (HCC) 04/07/2023  . COPD with acute exacerbation (HCC) 04/07/2023  . Ankle fracture, bimalleolar, closed, left, sequela 03/10/2023  . Opioid dependence (HCC) 03/10/2023  . Closed fracture of left ankle 03/10/2023  . Syndesmotic disruption of ankle, left, initial encounter 03/10/2023  . Lumbar transverse process fracture (HCC) 01/02/2023  . Acute on chronic hypoxic respiratory failure (HCC) 12/13/2022  . Depression, major, single episode, moderate (HCC) 09/10/2022  . Aortic atherosclerosis (HCC) 09/10/2022  . Chronic respiratory failure with hypoxia (HCC) 05/22/2021  . Anxiety and depression 05/22/2021  . Vitamin D deficiency 05/09/2021  . Left foot drop 01/09/2021  . Pulmonary HTN (HCC) 10/12/2020  . Encounter for long-term opiate analgesic use 02/02/2020  . Interstitial lung  disease (HCC) 04/14/2019  . GERD (gastroesophageal reflux disease) 10/15/2017  . Personal history of noncompliance with medical treatment, presenting hazards to health 04/26/2017  . Hypothyroidism 01/15/2017  . Adrenal insufficiency (Addison's disease) (HCC) 08/22/2016  . Symptomatic bradycardia   . Hypokalemia 08/17/2016  . Chronic pain syndrome 04/20/2016  . Irritable bowel syndrome with constipation 11/14/2015  . Avascular necrosis of bone of hip, left (HCC) 04/05/2015  . Post herpetic neuralgia 10/22/2014  . Mixed hyperlipidemia 08/25/2014  . Obstructive sleep apnea 02/18/2014  . Class 2 severe obesity due to excess calories with serious comorbidity and body mass index (BMI) of 38.0 to 38.9 in adult (HCC) 05/06/2013  . Fibromyalgia 02/11/2013  . Hypotension 01/16/2013  . Fibrocystic breast disease in female 04/09/2012  . Sarcoidosis 06/21/2008   Home Medication(s) Prior to Admission medications   Medication Sig Start Date End Date Taking? Authorizing Provider  albuterol (PROVENTIL) (2.5 MG/3ML) 0.083% nebulizer solution Take 3 mLs (2.5 mg total) by nebulization every 4 (four) hours as needed for wheezing or shortness of breath. 12/15/22 12/15/23  Shon Hale, MD  albuterol (VENTOLIN HFA) 108 (90 Base) MCG/ACT inhaler Inhale 2 puffs into the lungs every 6 (six) hours as needed for wheezing or shortness of breath. 12/15/22   Shon Hale, MD  budesonide (PULMICORT) 0.5 MG/2ML nebulizer solution Inhale the contents of 1 vial via nebulizer two (2) times a day. 11/22/21     busPIRone (BUSPAR) 15 MG tablet Take 1 tablet (15 mg) by mouth 3 times daily. 05/06/23   [name],  MD  calcitonin, salmon, (MIACALCIN) 200 UNIT/ACT nasal spray Use 1 spray in one nostril per day, alternate nostrils every other day (may have 1 vial she should use this for at least 2 weeks but may use as long as 4 weeks) 03/01/23   Gerda Diss, Jonna Coup, MD  doxycycline (VIBRA-TABS) 100 MG tablet Take 1 tablet (100 mg  total) by mouth 2 (two) times daily. 05/06/23   [name], MD  DULoxetine (CYMBALTA) 60 MG capsule Take 1 capsule (60 mg) by mouth daily. 05/06/23   [name], MD  EPINEPHrine 0.3 mg/0.3 mL IJ SOAJ injection Inject 0.3 mg into the muscle as needed for anaphylaxis. 01/05/22   [name], MD  ferrous sulfate 325 (65 FE) MG EC tablet Take 325 mg by mouth once a week.    [provider]  hydrocortisone (CORTEF) 10 MG tablet Take 2 tablets by mouth at 8:00 am and 1 tablet at lunch. (May double dose on sick days x 3-5 days) 03/28/23   Roma Kayser, MD  hydrOXYzine (ATARAX) 25 MG tablet Take 1 tablet (25 mg total) by mouth 3 (three) times daily as needed for anxiety. 04/30/23 04/29/24  [name], MD  ipratropium-albuterol (DUONEB) 0.5-2.5 (3) MG/3ML SOLN Take 3 mLs by nebulization every 8 (eight) hours. 12/17/22     levothyroxine (SYNTHROID) 50 MCG tablet Take 1 tablet by mouth daily before breakfast. 10/24/22 06/10/23  Roma Kayser, MD  lidocaine (LIDODERM) 5 % Place 1 patch onto the skin daily. Remove & Discard patch within 12 hours or as directed by MD 04/09/23   Sherryll Burger, Pratik D, DO  lidocaine (LIDODERM) 5 % Place 1 patch onto the skin daily. Remove and discard within 12 hours as directed by MD 04/02/23   Kommor, Wyn Forster, MD  linaclotide [facility]) 290 MCG CAPS capsule Take 1 capsule (290 mcg total) by mouth daily before breakfast. 03/28/22   [name], MD  methylPREDNISolone sodium succinate (SOLU-MEDROL, PF,) 125 MG injection ACT-O-VIAL Administer as directed.  Giving IV is preferred, but can also be given IM if IV access is difficult. 05/13/23   Roma Kayser, MD  naloxone [facility]) nasal spray 4 mg/0.1 mL Use as directed for opioid overdose 12/26/22   [name], MD  ondansetron (ZOFRAN-ODT) 8 MG disintegrating tablet Take 8 mg by mouth every 8 (eight) hours as needed for nausea or vomiting.    [provider]  ondansetron (ZOFRAN-ODT) 8 MG  disintegrating tablet Dissolve 1 tablet (8 mg total) by mouth every 8 (eight) hours as needed. 04/23/23 04/22/24  [name], MD  oxyCODONE-acetaminophen (PERCOCET) 10-325 MG tablet Take 1 tablet by mouth every 4 (four) hours as needed for pain. Maximum 6 tablets per day. Do not take with any other form of oxycodone 05/16/23 05/06/23   [name], MD  oxyCODONE-acetaminophen (PERCOCET) 10-325 MG tablet Take 1 tablet every 4 hours as needed pain maximum 6/day 06/14/23 05/06/23   [name], MD  pantoprazole (PROTONIX) 40 MG tablet Take 1 tablet (40 mg total) by mouth 2 (two) times daily before a meal. 12/15/22 12/15/23  Emokpae, Courage, MD  predniSONE (DELTASONE) 5 MG tablet Take 1 tablet (5 mg total) by mouth in the morning. 07/04/22     rosuvastatin (CRESTOR) 10 MG tablet Take 1 tablet (10 mg total) by mouth daily. 07/03/22   [name], MD  senna-docusate (SENOKOT-S) 8.6-50 MG tablet Take 1 tablet by mouth 2 (two) times daily between  meals as needed for mild constipation. 03/13/23   Almon Hercules, MD                                                                                                                                    Allergies Bee venom, Contrast media [iodinated contrast media], Bactrim [sulfamethoxazole-trimethoprim], Codeine, and Morphine and codeine  Review of Systems Review of Systems  Respiratory:  Positive for shortness of breath.    As noted in HPI  Physical Exam Vital Signs  I have reviewed the triage vital signs BP (!) 154/76 (BP Location: Right Arm)   Pulse 82   Temp 99.3 F (37.4 C) (Axillary)   Resp 18   Ht 5\' 10"  (1.778 m)   Wt 108.9 kg   LMP 05/17/2016   SpO2 100%   BMI 34.44 kg/m  *** Physical Exam  ED Results and Treatments Labs (all labs ordered are listed, but only abnormal results are displayed) Labs Reviewed - No data to display                                                                                                                        EKG  EKG Interpretation Date/Time:  Sunday June 02 2023 00:29:17 EDT Ventricular Rate:  70 PR Interval:  122 QRS Duration:  85 QT Interval:  394 QTC Calculation: 426 R Axis:   77  Text Interpretation: Sinus rhythm Abnormal R-wave progression, early transition No significant change was found Confirmed by Drema Pry ([id number]) on 06/02/2023 12:34:59 AM       Radiology No results found.  Medications Ordered in ED Medications  albuterol (VENTOLIN HFA) 108 (90 Base) MCG/ACT inhaler 2 puff (has no administration in time range)  ipratropium-albuterol (DUONEB) 0.5-2.5 (3) MG/3ML nebulizer solution 3 mL (3 mLs Nebulization Given 06/01/23 2329)   Procedures Procedures  (including critical care time) Medical Decision Making / ED Course   Medical Decision Making Amount and/or Complexity of Data Reviewed Labs: ordered. Radiology: ordered.  Risk Prescription drug management.    ***    Final Clinical Impression(s) / ED Diagnoses Final diagnoses:  None    This chart was dictated using voice recognition software.  Despite best efforts to proofread,  errors can occur which can change the documentation meaning.

## 2023-06-02 NOTE — ED Notes (Signed)
Pt provided with water to consume

## 2023-06-03 ENCOUNTER — Other Ambulatory Visit: Payer: Self-pay

## 2023-06-03 ENCOUNTER — Other Ambulatory Visit: Payer: Self-pay | Admitting: "Endocrinology

## 2023-06-04 ENCOUNTER — Telehealth: Payer: Self-pay | Admitting: Orthopedic Surgery

## 2023-06-04 ENCOUNTER — Other Ambulatory Visit (HOSPITAL_COMMUNITY): Payer: Self-pay

## 2023-06-04 MED ORDER — SOLU-MEDROL (PF) 125 MG IJ SOLR
INTRAMUSCULAR | 1 refills | Status: DC
Start: 2023-06-04 — End: 2023-06-24
  Filled 2023-06-04: qty 1, 1d supply, fill #0
  Filled 2023-06-17: qty 1, 1d supply, fill #1

## 2023-06-04 NOTE — Telephone Encounter (Signed)
I called and advised Kathleen [name] that she should be NWTB till her appt on 06/14/23. And that she should be wearing the boot at all times.

## 2023-06-04 NOTE — Telephone Encounter (Signed)
Gretchen called. She is the PT working with patient in home. She would like the WB status and would also like to know when ROM should be done? Should she wear the boot at all times or just sitting? Her cb# [phone]

## 2023-06-07 ENCOUNTER — Encounter (HOSPITAL_COMMUNITY): Payer: Self-pay

## 2023-06-07 ENCOUNTER — Emergency Department (HOSPITAL_COMMUNITY): Payer: HMO

## 2023-06-07 ENCOUNTER — Other Ambulatory Visit: Payer: Self-pay

## 2023-06-07 ENCOUNTER — Emergency Department (HOSPITAL_COMMUNITY)
Admission: EM | Admit: 2023-06-07 | Discharge: 2023-06-07 | Disposition: A | Discharge status: Home / Self Care | Payer: HMO | Attending: Emergency Medicine | Admitting: Emergency Medicine

## 2023-06-07 DIAGNOSIS — R0609 Other forms of dyspnea: Secondary | ICD-10-CM

## 2023-06-07 DIAGNOSIS — R0602 Shortness of breath: Secondary | ICD-10-CM | POA: Diagnosis not present

## 2023-06-07 DIAGNOSIS — J449 Chronic obstructive pulmonary disease, unspecified: Secondary | ICD-10-CM | POA: Insufficient documentation

## 2023-06-07 DIAGNOSIS — R0600 Dyspnea, unspecified: Secondary | ICD-10-CM | POA: Diagnosis not present

## 2023-06-07 DIAGNOSIS — H81399 Other peripheral vertigo, unspecified ear: Secondary | ICD-10-CM | POA: Insufficient documentation

## 2023-06-07 DIAGNOSIS — R079 Chest pain, unspecified: Secondary | ICD-10-CM | POA: Diagnosis not present

## 2023-06-07 DIAGNOSIS — I517 Cardiomegaly: Secondary | ICD-10-CM | POA: Diagnosis not present

## 2023-06-07 DIAGNOSIS — R0789 Other chest pain: Secondary | ICD-10-CM | POA: Diagnosis not present

## 2023-06-07 DIAGNOSIS — R42 Dizziness and giddiness: Secondary | ICD-10-CM | POA: Diagnosis not present

## 2023-06-07 DIAGNOSIS — Z79899 Other long term (current) drug therapy: Secondary | ICD-10-CM | POA: Insufficient documentation

## 2023-06-07 DIAGNOSIS — R918 Other nonspecific abnormal finding of lung field: Secondary | ICD-10-CM | POA: Diagnosis not present

## 2023-06-07 DIAGNOSIS — E039 Hypothyroidism, unspecified: Secondary | ICD-10-CM | POA: Insufficient documentation

## 2023-06-07 LAB — CBC
HCT: 35.1 % — ABNORMAL LOW (ref 36.0–46.0)
Hemoglobin: 10.5 g/dL — ABNORMAL LOW (ref 12.0–15.0)
MCH: 28.8 pg (ref 26.0–34.0)
MCHC: 29.9 g/dL — ABNORMAL LOW (ref 30.0–36.0)
MCV: 96.2 fL (ref 80.0–100.0)
Platelets: 419 10*3/uL — ABNORMAL HIGH (ref 150–400)
RBC: 3.65 MIL/uL — ABNORMAL LOW (ref 3.87–5.11)
RDW: 16 % — ABNORMAL HIGH (ref 11.5–15.5)
WBC: 11.2 10*3/uL — ABNORMAL HIGH (ref 4.0–10.5)
nRBC: 0 % (ref 0.0–0.2)

## 2023-06-07 LAB — BASIC METABOLIC PANEL
Anion gap: 9 (ref 5–15)
BUN: 12 mg/dL (ref 6–20)
CO2: 31 mmol/L (ref 22–32)
Calcium: 8.5 mg/dL — ABNORMAL LOW (ref 8.9–10.3)
Chloride: 96 mmol/L — ABNORMAL LOW (ref 98–111)
Creatinine, Ser: 0.88 mg/dL (ref 0.44–1.00)
GFR, Estimated: >60 mL/min (ref >60)
Glucose, Bld: 132 mg/dL — ABNORMAL HIGH (ref 70–99)
Potassium: 3.9 mmol/L (ref 3.5–5.1)
Sodium: 136 mmol/L (ref 135–145)

## 2023-06-07 LAB — TROPONIN I (HIGH SENSITIVITY): Troponin I (High Sensitivity): 8 ng/L (ref <18)

## 2023-06-07 MED ORDER — METHYLPREDNISOLONE SODIUM SUCC 125 MG IJ SOLR
125.0000 mg | Freq: Once | INTRAMUSCULAR | Status: AC
  Administered 2023-06-07: 125 mg via INTRAVENOUS
  Filled 2023-06-07: qty 2

## 2023-06-07 MED ORDER — METOCLOPRAMIDE HCL 5 MG/ML IJ SOLN
5.0000 mg | Freq: Once | INTRAMUSCULAR | Status: DC
  Filled 2023-06-07: qty 2

## 2023-06-07 MED ORDER — PROCHLORPERAZINE MALEATE 5 MG PO TABS: 5.0000 mg | ORAL_TABLET | Freq: Two times a day (BID) | ORAL | 0 refills | Status: DC | PRN

## 2023-06-07 MED ORDER — IPRATROPIUM-ALBUTEROL 0.5-2.5 (3) MG/3ML IN SOLN
3.0000 mL | Freq: Once | RESPIRATORY_TRACT | Status: AC
  Administered 2023-06-07: 3 mL via RESPIRATORY_TRACT
  Filled 2023-06-07: qty 3

## 2023-06-07 MED ORDER — MECLIZINE HCL 25 MG PO TABS: 25.0000 mg | ORAL_TABLET | Freq: Two times a day (BID) | ORAL | 0 refills | Status: DC | PRN

## 2023-06-07 MED ORDER — MECLIZINE HCL 12.5 MG PO TABS
25.0000 mg | ORAL_TABLET | Freq: Once | ORAL | Status: AC
  Administered 2023-06-07: 25 mg via ORAL
  Filled 2023-06-07: qty 2

## 2023-06-07 MED ORDER — SODIUM CHLORIDE 0.9 % IV BOLUS
500.0000 mL | Freq: Once | INTRAVENOUS | Status: AC
  Administered 2023-06-07: 500 mL via INTRAVENOUS

## 2023-06-07 MED ORDER — DIPHENHYDRAMINE HCL 50 MG/ML IJ SOLN
25.0000 mg | Freq: Once | INTRAMUSCULAR | Status: DC
  Filled 2023-06-07: qty 1

## 2023-06-07 NOTE — ED Provider Notes (Signed)
EMERGENCY DEPARTMENT AT [facility] Provider Note  CSN: [id number] Arrival date & time: 06/07/23 1149  Chief Complaint(s) Dizziness  HPI Kathleen [name] is a 62 y.o. female with past medical history as below, significant for adrenal insufficiency, COPD, fibromyalgia, sarcoidosis, chronic dyspnea, hypothyroid who presents to the ED with complaint of dizziness. 4L [place] at baseline.   Dizziness intermittent over the past couple weeks, worse with head turning and ambulation.  Room spinning sensation.  Sometimes makes her feel nauseated, some difficulty with her vision associated with the dizziness.  No ear ringing or hearing loss.  No vision loss.  No fevers.  No head injuries.  Does report some fullness in her right ear, some rhinorrhea.  Still having some pain rating from her upper thoracic spine under her left breast from prior injury.  Pain similar location to prior injury. She has some wheezing/mildly worsened dyspnea from baseline. Compliant with home medications.  Cough unchanged her baseline    Past Medical History Past Medical History:  Diagnosis Date   Adrenal insufficiency (HCC)    Anemia    Anxiety    Avascular bone necrosis (HCC)    Breast fibrocystic disorder    Chronic pain    COPD (chronic obstructive pulmonary disease) (HCC)    Encounter for long-term opiate analgesic use 02/02/2020   Patient has chronic pain with femoral head avascular necrosis.  Is under pain contract   Fibromyalgia    Gastroesophageal reflux disease    Hypothyroidism    Mitral valve prolapse    Orthostatic hypotension    Peripheral neuropathy    PONV (postoperative nausea and vomiting)    Restrictive lung disease    Moderate to severe   Sarcoidosis    Biopsy proven - [facility]   SOB (shortness of breath)    chronic   Patient Active Problem List   Diagnosis Date Noted   COPD exacerbation (HCC) 04/07/2023   COPD with acute exacerbation (HCC) 04/07/2023   Ankle fracture,  bimalleolar, closed, left, sequela 03/10/2023   Opioid dependence (HCC) 03/10/2023   Closed fracture of left ankle 03/10/2023   Syndesmotic disruption of ankle, left, initial encounter 03/10/2023   Lumbar transverse process fracture (HCC) 01/02/2023   Acute on chronic hypoxic respiratory failure (HCC) 12/13/2022   Depression, major, single episode, moderate (HCC) 09/10/2022   Aortic atherosclerosis (HCC) 09/10/2022   Chronic respiratory failure with hypoxia (HCC) 05/22/2021   Anxiety and depression 05/22/2021   Vitamin D deficiency 05/09/2021   Left foot drop 01/09/2021   Pulmonary HTN (HCC) 10/12/2020   Encounter for long-term opiate analgesic use 02/02/2020   Interstitial lung disease (HCC) 04/14/2019   GERD (gastroesophageal reflux disease) 10/15/2017   Personal history of noncompliance with medical treatment, presenting hazards to health 04/26/2017   Hypothyroidism 01/15/2017   Adrenal insufficiency (Addison's disease) (HCC) 08/22/2016   Symptomatic bradycardia    Hypokalemia 08/17/2016   Chronic pain syndrome 04/20/2016   Irritable bowel syndrome with constipation 11/14/2015   Avascular necrosis of bone of hip, left (HCC) 04/05/2015   Post herpetic neuralgia 10/22/2014   Mixed hyperlipidemia 08/25/2014   Obstructive sleep apnea 02/18/2014   Class 2 severe obesity due to excess calories with serious comorbidity and body mass index (BMI) of 38.0 to 38.9 in adult [facility]) 05/06/2013   Fibromyalgia 02/11/2013   Hypotension 01/16/2013   Fibrocystic breast disease in female 04/09/2012   Sarcoidosis 06/21/2008   Home Medication(s) Prior to Admission medications   Medication Sig Start Date End  Date Taking? Authorizing Provider  meclizine (ANTIVERT) 25 MG tablet Take 1 tablet (25 mg total) by mouth 2 (two) times daily as needed for dizziness. 06/07/23  Yes Tanda Rockers A, DO  prochlorperazine (COMPAZINE) 5 MG tablet Take 1 tablet (5 mg total) by mouth 2 (two) times daily as needed for  nausea or vomiting. 06/07/23  Yes Tanda Rockers A, DO  albuterol (PROVENTIL) (2.5 MG/3ML) 0.083% nebulizer solution Take 3 mLs (2.5 mg total) by nebulization every 4 (four) hours as needed for wheezing or shortness of breath. 12/15/22 12/15/23  Shon Hale, MD  albuterol (VENTOLIN HFA) 108 (90 Base) MCG/ACT inhaler Inhale 2 puffs into the lungs every 6 (six) hours as needed for wheezing or shortness of breath. 12/15/22   Shon Hale, MD  budesonide (PULMICORT) 0.5 MG/2ML nebulizer solution Inhale the contents of 1 vial via nebulizer two (2) times a day. 11/22/21     busPIRone (BUSPAR) 15 MG tablet Take 1 tablet (15 mg) by mouth 3 times daily. 05/06/23   [name], MD  calcitonin, salmon, (MIACALCIN) 200 UNIT/ACT nasal spray Use 1 spray in one nostril per day, alternate nostrils every other day (may have 1 vial she should use this for at least 2 weeks but may use as long as 4 weeks) 03/01/23   Gerda Diss, Jonna Coup, MD  doxycycline (VIBRA-TABS) 100 MG tablet Take 1 tablet (100 mg total) by mouth 2 (two) times daily. 05/06/23   [name], MD  DULoxetine (CYMBALTA) 60 MG capsule Take 1 capsule (60 mg) by mouth daily. 05/06/23   [name], MD  EPINEPHrine 0.3 mg/0.3 mL IJ SOAJ injection Inject 0.3 mg into the muscle as needed for anaphylaxis. 01/05/22   [name], MD  ferrous sulfate 325 (65 FE) MG EC tablet Take 325 mg by mouth once a week.    [provider]  hydrocortisone (CORTEF) 10 MG tablet Take 2 tablets by mouth at 8:00 am and 1 tablet at lunch. (May double dose on sick days x 3-5 days) 03/28/23   Roma Kayser, MD  hydrOXYzine (ATARAX) 25 MG tablet Take 1 tablet (25 mg total) by mouth 3 (three) times daily as needed for anxiety. 04/30/23 04/29/24  [name], MD  ipratropium-albuterol (DUONEB) 0.5-2.5 (3) MG/3ML SOLN Take 3 mLs by nebulization every 8 (eight) hours. 12/17/22     levothyroxine (SYNTHROID) 50 MCG tablet Take 1 tablet by mouth daily before breakfast.  10/24/22 06/10/23  Roma Kayser, MD  lidocaine (LIDODERM) 5 % Place 1 patch onto the skin daily. Remove & Discard patch within 12 hours or as directed by MD 04/09/23   Sherryll Burger, Pratik D, DO  lidocaine (LIDODERM) 5 % Place 1 patch onto the skin daily. Remove and discard within 12 hours as directed by MD 04/02/23   Kommor, Wyn Forster, MD  linaclotide [facility]) 290 MCG CAPS capsule Take 1 capsule (290 mcg total) by mouth daily before breakfast. 03/28/22   [name], MD  methylPREDNISolone sodium succinate (SOLU-MEDROL, PF,) 125 MG injection ACT-O-VIAL Administer as directed.  Giving IV is preferred, but can also be given IM if IV access is difficult. 06/04/23   Roma Kayser, MD  naloxone [facility]) nasal spray 4 mg/0.1 mL Use as directed for opioid overdose 12/26/22   [name], MD  ondansetron (ZOFRAN-ODT) 8 MG disintegrating tablet Take 8 mg by mouth every 8 (eight) hours as needed for nausea or vomiting.    [provider]  ondansetron (ZOFRAN-ODT)  8 MG disintegrating tablet Dissolve 1 tablet (8 mg total) by mouth every 8 (eight) hours as needed. 04/23/23 04/22/24  [name], MD  oxyCODONE-acetaminophen (PERCOCET) 10-325 MG tablet Take 1 tablet by mouth every 4 (four) hours as needed for pain. Maximum 6 tablets per day. Do not take with any other form of oxycodone 05/16/23 05/06/23   [name], MD  oxyCODONE-acetaminophen (PERCOCET) 10-325 MG tablet Take 1 tablet every 4 hours as needed pain maximum 6/day 06/14/23 05/06/23   [name], MD  pantoprazole (PROTONIX) 40 MG tablet Take 1 tablet (40 mg total) by mouth 2 (two) times daily before a meal. 12/15/22 12/15/23  Emokpae, Courage, MD  predniSONE (DELTASONE) 5 MG tablet Take 1 tablet (5 mg total) by mouth in the morning. 07/04/22     prochlorperazine (COMPAZINE) 25 MG suppository Place 1 suppository (25 mg total) rectally every 12 (twelve) hours as needed for nausea or vomiting. 06/02/23   Nira Conn, MD   rosuvastatin (CRESTOR) 10 MG tablet Take 1 tablet (10 mg total) by mouth daily. 07/03/22   [name], MD  senna-docusate (SENOKOT-S) 8.6-50 MG tablet Take 1 tablet by mouth 2 (two) times daily between meals as needed for mild constipation. 03/13/23   Almon Hercules, MD                                                                                                                                    Past Surgical History Past Surgical History:  Procedure Laterality Date   BIOPSY  10/17/2018   Procedure: BIOPSY;  Surgeon: Malissa Hippo, MD;  Location: [facility] ENDO SUITE;  Service: Endoscopy;;  duodenum, antrum, gastric body   BIOPSY  03/03/2021   Procedure: BIOPSY;  Surgeon: Malissa Hippo, MD;  Location: [facility] END[facility];  Service: Endoscopy;;   BREAST LUMPECTOMY Bilateral 01/12/2011   CHOLECYSTECTOMY N/A 04/17/2021   Procedure: LAPAROSCOPIC CHOLECYSTECTOMY;  Surgeon: Franky Macho, MD;  Location: [facility] ORS;  Service: General;  Laterality: N/A;   COLONOSCOPY WITH PROPOFOL N/A 01/03/2022   Procedure: COLONOSCOPY WITH PROPOFOL;  Surgeon: Malissa Hippo, MD;  Location: [facility];  Service: Endoscopy;  Laterality: N/A;  220   ESOPHAGEAL DILATION N/A 03/03/2021   Procedure: ESOPHAGEAL DILATION;  Surgeon: Malissa Hippo, MD;  Location: [facility];  Service: Endoscopy;  Laterality: N/A;   ESOPHAGOGASTRODUODENOSCOPY (EGD) WITH PROPOFOL N/A 10/17/2018   Procedure: ESOPHAGOGASTRODUODENOSCOPY (EGD) WITH PROPOFOL;  Surgeon: Malissa Hippo, MD;  Location: [facility];  Service: Endoscopy;  Laterality: N/A;  2:25   ESOPHAGOGASTRODUODENOSCOPY (EGD) WITH PROPOFOL N/A 03/03/2021   Procedure: ESOPHAGOGASTRODUODENOSCOPY (EGD) WITH PROPOFOL;  Surgeon: Malissa Hippo, MD;  [facility]: [facility];  Service: Endoscopy;  Laterality: N/A;  12:15   ORIF ANKLE FRACTURE Left 03/10/2023   Procedure: OPEN REDUCTION INTERNAL FIXATION (ORIF) ANKLE FRACTURE;  Surgeon: [name], MD;  Location: [facility] OR;  Service: Orthopedics;  Laterality: Left;   POLYPECTOMY  01/03/2022   Procedure: POLYPECTOMY INTESTINAL;  Surgeon: Malissa Hippo, MD;  Location: [facility];  Service: Endoscopy;;   TUBAL LIGATION     Family History Family History  Problem Relation Age of Onset   Cardiomyopathy Mother    Breast cancer Mother    Prostate cancer Father    Heart attack Maternal Grandmother    Heart attack Maternal Grandfather    Heart attack Paternal Grandmother    Bipolar disorder Daughter    Heart disease Maternal Aunt    Neurofibromatosis Cousin    Colon cancer Neg Hx     Social History Social History   Tobacco Use   Smoking status: Never    Passive exposure: Never   Smokeless tobacco: Never  Vaping Use   Vaping status: Never Used  Substance Use Topics   Alcohol use: No   Drug use: No   Allergies Bee venom, Contrast media [iodinated contrast media], Bactrim [sulfamethoxazole-trimethoprim], Codeine, and Morphine and codeine  Review of Systems Review of Systems  Constitutional:  Negative for chills and fever.  HENT:  Positive for congestion and sinus pressure.   Respiratory:  Positive for shortness of breath. Negative for chest tightness.   Cardiovascular:  Positive for chest pain.  Gastrointestinal:  Negative for abdominal pain and diarrhea.  Musculoskeletal:  Positive for arthralgias.  Neurological:  Positive for dizziness. Negative for speech difficulty and light-headedness.  All other systems reviewed and are negative.   Physical Exam Vital Signs  I have reviewed the triage vital signs BP (!) 150/76   Pulse 81   Temp 98.2 F (36.8 C) (Oral)   Resp (!) 23   Ht 5\' 10"  (1.778 m)   Wt 108.9 kg   LMP 05/17/2016   SpO2 99%   BMI 34.44 kg/m  Physical Exam Vitals and nursing note reviewed.  Constitutional:      General: She is not in acute distress.    Appearance: Normal appearance. She is not ill-appearing or toxic-appearing.  HENT:     Head: Normocephalic and  atraumatic.     Right Ear: Tympanic membrane and external ear normal.     Left Ear: Tympanic membrane and external ear normal.     Nose: Nose normal.     Mouth/Throat:     Mouth: Mucous membranes are moist.  Eyes:     General: Vision grossly intact. No scleral icterus.       Right eye: No discharge.        Left eye: No discharge.     Comments: Fatigable horizontal nystagmus  Cardiovascular:     Rate and Rhythm: Normal rate and regular rhythm.     Pulses: Normal pulses.     Heart sounds: Normal heart sounds.  Pulmonary:     Effort: Pulmonary effort is normal. No respiratory distress.     Breath sounds: No stridor. Decreased breath sounds and wheezing present.  Abdominal:     General: Abdomen is flat. There is no distension.     Palpations: Abdomen is soft.     Tenderness: There is no abdominal tenderness.  Musculoskeletal:     Cervical back: No rigidity.     Right lower leg: No edema.     Left lower leg: No edema.  Skin:    General: Skin is warm and dry.     Capillary Refill: Capillary refill takes less than 2 seconds.  Neurological:     Mental Status: She is  alert and oriented to person, place, and time.     GCS: GCS eye subscore is 4. GCS verbal subscore is 5. GCS motor subscore is 6.     Cranial Nerves: Cranial nerves 2-12 are intact.     Sensory: Sensation is intact.     Motor: Motor function is intact.     Coordination: Coordination is intact.  Psychiatric:        Mood and Affect: Mood normal.        Behavior: Behavior normal. Behavior is cooperative.     ED Results and Treatments Labs (all labs ordered are listed, but only abnormal results are displayed) Labs Reviewed  BASIC METABOLIC PANEL - Abnormal; Notable for the following components:      Result Value   Chloride 96 (*)    Glucose, Bld 132 (*)    Calcium 8.5 (*)    All other components within normal limits  CBC - Abnormal; Notable for the following components:   WBC 11.2 (*)    RBC 3.65 (*)     Hemoglobin 10.5 (*)    HCT 35.1 (*)    MCHC 29.9 (*)    RDW 16.0 (*)    Platelets 419 (*)    All other components within normal limits  TROPONIN I (HIGH SENSITIVITY)                                                                                                                          Radiology DG Chest Portable 1 View  Result Date: 06/07/2023 CLINICAL DATA:  Chest pain with positional dizziness. EXAM: PORTABLE CHEST 1 VIEW COMPARISON:  06/01/2023; 05/20/2023; chest CT-04/02/2023 FINDINGS: The examination is significantly degraded due to patient body habitus. Unchanged enlarged cardiac silhouette and mediastinal contours. Redemonstrated diffuse slightly nodular thickening of the pulmonary interstitium. Grossly unchanged infrahilar linear heterogeneous opacities favored to represent atelectasis or scarring. No new focal airspace opacities. No pleural effusion or pneumothorax. No evidence of edema. No acute osseous abnormalities. IMPRESSION: Similar findings of cardiomegaly, emphysematous change and bibasilar atelectasis/scarring without superimposed acute cardiopulmonary disease on this body habitus degraded AP portable examination. Electronically Signed   By: Simonne Come M.D.   On: 06/07/2023 16:21    Pertinent labs & imaging results that were available during my care of the patient were reviewed by me and considered in my medical decision making (see MDM for details).  Medications Ordered in ED Medications  ipratropium-albuterol (DUONEB) 0.5-2.5 (3) MG/3ML nebulizer solution 3 mL (3 mLs Nebulization Given 06/07/23 1423)  methylPREDNISolone sodium succinate (SOLU-MEDROL) 125 mg/2 mL injection 125 mg (125 mg Intravenous Given 06/07/23 1336)  meclizine (ANTIVERT) tablet 25 mg (25 mg Oral Given 06/07/23 1349)  sodium chloride 0.9 % bolus 500 mL (0 mLs Intravenous Stopped 06/07/23 1438)  Procedures Procedures  (including critical care time)  Medical Decision Making / ED Course    Medical Decision Making:    Britten Parady Platas is a 62 y.o. female with past medical history as below, significant for adrenal insufficiency, COPD, fibromyalgia, sarcoidosis, chronic dyspnea, hypothyroid who presents to the ED with complaint of dizziness.. The complaint involves an extensive differential diagnosis and also carries with it a high risk of complications and morbidity.  Serious etiology was considered. Ddx includes but is not limited to: In my evaluation of this patient's dyspnea my DDx includes, but is not limited to, pneumonia, pulmonary embolism, pneumothorax, pulmonary edema, metabolic acidosis, asthma, COPD, cardiac cause, anemia, anxiety, disequilibrium, peripheral vertigo, Mnire's disease, vestibular syndrome, etc.  Complete initial physical exam performed, notably the patient  was currently symptomatic, HDS.    Reviewed and confirmed nursing documentation for past medical history, family history, social history.  Vital signs reviewed.    Clinical Course as of 06/07/23 2116  Fri Jun 07, 2023  1505 Symptoms resolved on recheck  Requesting discharge, does not want to stay for remainder of testing [SG]  1510 CXR on wet read looks similar to prior, pt does not want to stay for formal read [SG]    Clinical Course User Index [SG] Sloan Leiter, DO     Patient presents with vertigo. On initial evaluation patient appears in no acute distress, afebrile with normal vital signs. Vertigo most suggestive of peripheral cause. Neuro intact without sign of CNS ischemia or other serious etiology. DC on Meclizine with close PCP F/U. Warnings discussed.   Patient in no distress and overall condition improved here in the ED. Detailed discussions were had with the patient regarding current findings, and need for close f/u with PCP or on call doctor. The patient has been  instructed to return immediately if the symptoms worsen in any way for re-evaluation. Patient verbalized understanding and is in agreement with current care plan. All questions answered prior to discharge.                 Additional history obtained: -Additional history obtained from na -External records from outside source obtained and reviewed including: Chart review including previous notes, labs, imaging, consultation notes including  Home medications Prior ed visits    Lab Tests: -I ordered, reviewed, and interpreted labs.   The pertinent results include:   Labs Reviewed  BASIC METABOLIC PANEL - Abnormal; Notable for the following components:      Result Value   Chloride 96 (*)    Glucose, Bld 132 (*)    Calcium 8.5 (*)    All other components within normal limits  CBC - Abnormal; Notable for the following components:   WBC 11.2 (*)    RBC 3.65 (*)    Hemoglobin 10.5 (*)    HCT 35.1 (*)    MCHC 29.9 (*)    RDW 16.0 (*)    Platelets 419 (*)    All other components within normal limits  TROPONIN I (HIGH SENSITIVITY)    Notable for labs stable  EKG   EKG Interpretation Date/Time:  Friday June 07 2023 12:53:20 EDT Ventricular Rate:  82 PR Interval:  127 QRS Duration:  84 QT Interval:  360 QTC Calculation: 421 R Axis:   51  Text Interpretation: Sinus rhythm Low voltage, precordial leads Abnormal R-wave progression, early transition Borderline T abnormalities, anterior leads Confirmed by Tanda Rockers ([id number]) on 06/07/2023 3:18:42 PM  Imaging Studies ordered: I ordered imaging studies including CXR I independently visualized the following imaging with scope of interpretation limited to determining acute life threatening conditions related to emergency care; findings noted above I independently visualized and interpreted imaging. I agree with the radiologist interpretation   Medicines ordered and prescription drug management: Meds ordered  this encounter  Medications   ipratropium-albuterol (DUONEB) 0.5-2.5 (3) MG/3ML nebulizer solution 3 mL   methylPREDNISolone sodium succinate (SOLU-MEDROL) 125 mg/2 mL injection 125 mg   meclizine (ANTIVERT) tablet 25 mg   sodium chloride 0.9 % bolus 500 mL   DISCONTD: metoCLOPramide (REGLAN) injection 5 mg   DISCONTD: diphenhydrAMINE (BENADRYL) injection 25 mg   meclizine (ANTIVERT) 25 MG tablet    Sig: Take 1 tablet (25 mg total) by mouth 2 (two) times daily as needed for dizziness.    Dispense:  15 tablet    Refill:  0   prochlorperazine (COMPAZINE) 5 MG tablet    Sig: Take 1 tablet (5 mg total) by mouth 2 (two) times daily as needed for nausea or vomiting.    Dispense:  10 tablet    Refill:  0    -I have reviewed the patients home medicines and have made adjustments as needed   Consultations Obtained: na   Cardiac Monitoring: The patient was maintained on a cardiac monitor.  I personally viewed and interpreted the cardiac monitored which showed an underlying rhythm of: NSR Continuous pulse oximetry interpreted by myself, 99% on 4LPM (HOME REGIMEN).    Social Determinants of Health:  Diagnosis or treatment significantly limited by social determinants of health: obesity   Reevaluation: After the interventions noted above, I reevaluated the patient and found that they have resolved  Co morbidities that complicate the patient evaluation  Past Medical History:  Diagnosis Date   Adrenal insufficiency (HCC)    Anemia    Anxiety    Avascular bone necrosis (HCC)    Breast fibrocystic disorder    Chronic pain    COPD (chronic obstructive pulmonary disease) (HCC)    Encounter for long-term opiate analgesic use 02/02/2020   Patient has chronic pain with femoral head avascular necrosis.  Is under pain contract   Fibromyalgia    Gastroesophageal reflux disease    Hypothyroidism    Mitral valve prolapse    Orthostatic hypotension    Peripheral neuropathy    PONV  (postoperative nausea and vomiting)    Restrictive lung disease    Moderate to severe   Sarcoidosis    Biopsy proven - [facility]   SOB (shortness of breath)    chronic      Dispostion: Disposition decision including need for hospitalization was considered, and patient discharged from emergency department.    Final Clinical Impression(s) / ED Diagnoses Final diagnoses:  Peripheral vertigo, unspecified laterality  Chronic dyspnea        Sloan Leiter, DO 06/07/23 2116

## 2023-06-07 NOTE — ED Triage Notes (Signed)
Pt reports she has been having dizzy spells with position changes for several months and she fell a few months ago and broke her back.  Pt reports dizziness is now present while she is sitting and laying down.  Pt also reports the pain in her back is wrapping around to her left ribcage and this usually happens when her graves disease is acting up.

## 2023-06-07 NOTE — Discharge Instructions (Addendum)
It was a pleasure caring for you today in the emergency department.  Please follow-up with ENT if vertigo symptoms persist  Please return to the emergency department for any worsening or worrisome symptoms.

## 2023-06-08 ENCOUNTER — Other Ambulatory Visit (HOSPITAL_COMMUNITY): Payer: Self-pay

## 2023-06-10 ENCOUNTER — Other Ambulatory Visit: Payer: Self-pay | Admitting: "Endocrinology

## 2023-06-10 ENCOUNTER — Other Ambulatory Visit (HOSPITAL_COMMUNITY): Payer: Self-pay

## 2023-06-10 MED ORDER — LEVOTHYROXINE SODIUM 50 MCG PO TABS
50.0000 ug | ORAL_TABLET | Freq: Every day | ORAL | 1 refills | Status: DC
  Filled 2023-06-10: qty 90, 90d supply, fill #0
  Filled 2023-08-31: qty 90, 90d supply, fill #1

## 2023-06-11 DIAGNOSIS — D869 Sarcoidosis, unspecified: Secondary | ICD-10-CM | POA: Diagnosis not present

## 2023-06-11 DIAGNOSIS — F418 Other specified anxiety disorders: Secondary | ICD-10-CM | POA: Diagnosis not present

## 2023-06-11 DIAGNOSIS — J9611 Chronic respiratory failure with hypoxia: Secondary | ICD-10-CM | POA: Diagnosis not present

## 2023-06-11 DIAGNOSIS — J441 Chronic obstructive pulmonary disease with (acute) exacerbation: Secondary | ICD-10-CM | POA: Diagnosis not present

## 2023-06-11 DIAGNOSIS — Z9981 Dependence on supplemental oxygen: Secondary | ICD-10-CM | POA: Diagnosis not present

## 2023-06-11 DIAGNOSIS — Z7982 Long term (current) use of aspirin: Secondary | ICD-10-CM | POA: Diagnosis not present

## 2023-06-11 DIAGNOSIS — F32A Depression, unspecified: Secondary | ICD-10-CM | POA: Diagnosis not present

## 2023-06-11 DIAGNOSIS — M797 Fibromyalgia: Secondary | ICD-10-CM | POA: Diagnosis not present

## 2023-06-11 DIAGNOSIS — E2740 Unspecified adrenocortical insufficiency: Secondary | ICD-10-CM | POA: Diagnosis not present

## 2023-06-11 DIAGNOSIS — E039 Hypothyroidism, unspecified: Secondary | ICD-10-CM | POA: Diagnosis not present

## 2023-06-11 DIAGNOSIS — J849 Interstitial pulmonary disease, unspecified: Secondary | ICD-10-CM | POA: Diagnosis not present

## 2023-06-11 DIAGNOSIS — G894 Chronic pain syndrome: Secondary | ICD-10-CM | POA: Diagnosis not present

## 2023-06-14 ENCOUNTER — Ambulatory Visit (INDEPENDENT_AMBULATORY_CARE_PROVIDER_SITE_OTHER): Payer: HMO | Admitting: Orthopedic Surgery

## 2023-06-14 ENCOUNTER — Other Ambulatory Visit (INDEPENDENT_AMBULATORY_CARE_PROVIDER_SITE_OTHER): Payer: HMO

## 2023-06-14 ENCOUNTER — Other Ambulatory Visit (HOSPITAL_COMMUNITY): Payer: Self-pay

## 2023-06-14 DIAGNOSIS — S82892D Other fracture of left lower leg, subsequent encounter for closed fracture with routine healing: Secondary | ICD-10-CM

## 2023-06-14 NOTE — Progress Notes (Signed)
Orthopedic Surgery Post-operative Office Visit   Procedure: Left ankle fracture ORIF Date of Surgery: 03/10/2023 (~3 months post-op)   Assessment: Patient is a 62 y.o. who is doing well after surgery     Plan: -Can transition to weight bearing as tolerated in a boot -Will plan to let her weight bear in regular shoes at our next visit -Will plan to allow for weight bearing in a boot at our next visit -Return to office in 4 weeks, x-rays needed at next visit: AP/lateral/mortise out of boot   ___________________________________________________________________________     Subjective: Patient has been doing well since she was last seen. She has been walking in the boot short distances. Not having pain in her ankle. Has not noticed any redness or drainage around her incision.    Objective:   General: no acute distress, appropriate affect Neurologic: alert, answering questions appropriately, following commands Respiratory: unlabored breathing on room air Skin: Incisions over the medial and lateral aspect of the ankle are well-healed   MSK (LLE): Dorsiflexion to neutral, plantarflexion to 25 degrees, no pain through active ankle range of motion, dorsiflexes and plantarflexes toes, sensation intact to light touch in sural/saphenous/deep peroneal/superficial peroneal/tibial nerve distributions, foot warm and well-perfused     Imaging: X-rays of the left ankle taken 06/14/2023 were independently reviewed and interpreted, showing minimally displaced medial and lateral malleolus fractures. Fixation is intact with no lucency around the screws and none of the screws have backed out. No new fractures seen.     Patient name: Kathleen [name] Patient MRN: [id number] Date of visit: 06/14/23

## 2023-06-17 ENCOUNTER — Telehealth: Payer: Self-pay | Admitting: Family Medicine

## 2023-06-17 ENCOUNTER — Other Ambulatory Visit: Payer: Self-pay | Admitting: Family Medicine

## 2023-06-17 ENCOUNTER — Other Ambulatory Visit (HOSPITAL_COMMUNITY): Payer: Self-pay

## 2023-06-17 NOTE — Telephone Encounter (Signed)
Patient is requesting a refill on her meclizine that she got from the hospital. She states that she needs this for the dizziness. She uses [facility] on [address]  CB# [phone]

## 2023-06-17 NOTE — Telephone Encounter (Signed)
May have 1 refill with 1 additional refill of the meclizine thank you

## 2023-06-18 ENCOUNTER — Other Ambulatory Visit (HOSPITAL_COMMUNITY): Payer: Self-pay

## 2023-06-18 ENCOUNTER — Encounter: Payer: Self-pay | Admitting: *Deleted

## 2023-06-18 MED ORDER — MECLIZINE HCL 25 MG PO TABS: 25.0000 mg | ORAL_TABLET | Freq: Two times a day (BID) | ORAL | 1 refills | Status: DC | PRN

## 2023-06-18 NOTE — Telephone Encounter (Signed)
Prescription sent electronically to pharmacy. Patient notified via my chart 

## 2023-06-19 ENCOUNTER — Other Ambulatory Visit (HOSPITAL_COMMUNITY): Payer: Self-pay

## 2023-06-19 ENCOUNTER — Other Ambulatory Visit: Payer: Self-pay

## 2023-06-19 MED ORDER — MIDODRINE HCL 5 MG PO TABS
5.0000 mg | ORAL_TABLET | Freq: Two times a day (BID) | ORAL | 5 refills | Status: DC
  Filled 2023-06-19: qty 60, 30d supply, fill #0
  Filled 2023-10-11: qty 60, 30d supply, fill #1
  Filled 2023-11-06: qty 60, 30d supply, fill #2
  Filled 2023-12-30: qty 60, 30d supply, fill #3

## 2023-06-20 DIAGNOSIS — D860 Sarcoidosis of lung: Secondary | ICD-10-CM | POA: Diagnosis not present

## 2023-06-20 DIAGNOSIS — J849 Interstitial pulmonary disease, unspecified: Secondary | ICD-10-CM | POA: Diagnosis not present

## 2023-06-21 DIAGNOSIS — J849 Interstitial pulmonary disease, unspecified: Secondary | ICD-10-CM | POA: Diagnosis not present

## 2023-06-22 ENCOUNTER — Other Ambulatory Visit (HOSPITAL_COMMUNITY): Payer: Self-pay

## 2023-06-24 ENCOUNTER — Other Ambulatory Visit: Payer: Self-pay | Admitting: "Endocrinology

## 2023-06-24 ENCOUNTER — Other Ambulatory Visit: Payer: Self-pay | Admitting: Family Medicine

## 2023-06-24 ENCOUNTER — Other Ambulatory Visit (HOSPITAL_COMMUNITY): Payer: Self-pay

## 2023-06-24 ENCOUNTER — Other Ambulatory Visit (HOSPITAL_BASED_OUTPATIENT_CLINIC_OR_DEPARTMENT_OTHER): Payer: Self-pay

## 2023-06-24 NOTE — Telephone Encounter (Signed)
Per his last note, it was prescribed in August to use in case of emergency.  Has she had to use her previous one?  I would prefer this to wait for Dr. Fransico Him as I am not so sure about this.

## 2023-06-25 ENCOUNTER — Ambulatory Visit (INDEPENDENT_AMBULATORY_CARE_PROVIDER_SITE_OTHER): Payer: HMO | Admitting: Internal Medicine

## 2023-06-25 ENCOUNTER — Encounter: Payer: Self-pay | Admitting: Internal Medicine

## 2023-06-25 ENCOUNTER — Other Ambulatory Visit (HOSPITAL_COMMUNITY): Payer: Self-pay

## 2023-06-25 ENCOUNTER — Other Ambulatory Visit: Payer: Self-pay | Admitting: *Deleted

## 2023-06-25 VITALS — BP 136/76 | HR 101 | Temp 97.5°F | Ht 70.0 in | Wt 280.4 lb

## 2023-06-25 DIAGNOSIS — R1319 Other dysphagia: Secondary | ICD-10-CM

## 2023-06-25 DIAGNOSIS — R1310 Dysphagia, unspecified: Secondary | ICD-10-CM

## 2023-06-25 MED ORDER — HYDROXYZINE HCL 25 MG PO TABS
25.0000 mg | ORAL_TABLET | Freq: Three times a day (TID) | ORAL | 1 refills | Status: DC | PRN
  Filled 2023-06-25 – 2023-07-03 (×2): qty 90, 30d supply, fill #0
  Filled 2023-08-01: qty 90, 30d supply, fill #1

## 2023-06-25 NOTE — Progress Notes (Signed)
Primary Care Physician:  [name], MD Primary Gastroenterologist:  Dr. Jena Gauss  Pre-Procedure History & Physical: HPI:  Kathleen [name] is a 62 y.o. female here for concerns she has had difficulty swallowing over the past 2 days started insidiously.  Does not feel that she had a sentinel food impaction.  Has had absolutely no throat pain or chest pain.  On twice daily Protonix.  She is also on chronic narcotics and prednisone.  Chronic dysphagia evaluated by Dr. Dionicia Abler 2 years ago normal esophagus esophagus empirically dilated with a Maloney.  Does not recall any improvement.  Since that time got her gallbladder out by Dr. Lovell Sheehan.  Did not help her chronic abdominal pain chronically constipated.  Presented to the ED earlier this year with chronic abdominal pain large stool burden nothing else acute has been on Linzess 290 daily manages her bowel function fairly well.  Patient has no difficulty controlling secretions.  And is able to get her medications liquids and food down.  Significantly progressive interstitial lung disease over the past 1 year      Past Medical History:  Diagnosis Date   Adrenal insufficiency (HCC)    Anemia    Anxiety    Avascular bone necrosis (HCC)    Breast fibrocystic disorder    Chronic pain    COPD (chronic obstructive pulmonary disease) (HCC)    Encounter for long-term opiate analgesic use 02/02/2020   Patient has chronic pain with femoral head avascular necrosis.  Is under pain contract   Fibromyalgia    Gastroesophageal reflux disease    Hypothyroidism    Mitral valve prolapse    Orthostatic hypotension    Peripheral neuropathy    PONV (postoperative nausea and vomiting)    Restrictive lung disease    Moderate to severe   Sarcoidosis    Biopsy proven - [facility]   SOB (shortness of breath)    chronic    Past Surgical History:  Procedure Laterality Date   BIOPSY  10/17/2018   Procedure: BIOPSY;  Surgeon: Malissa Hippo, MD;  Location: [facility];  Service: Endoscopy;;  duodenum, antrum, gastric body   BIOPSY  03/03/2021   Procedure: BIOPSY;  Surgeon: Malissa Hippo, MD;  Location: [facility];  Service: Endoscopy;;   BREAST LUMPECTOMY Bilateral 01/12/2011   CHOLECYSTECTOMY N/A 04/17/2021   Procedure: LAPAROSCOPIC CHOLECYSTECTOMY;  Surgeon: Franky Macho, MD;  Location: [facility];  Service: General;  Laterality: N/A;   COLONOSCOPY WITH PROPOFOL N/A 01/03/2022   Procedure: COLONOSCOPY WITH PROPOFOL;  Surgeon: Malissa Hippo, MD;  [facility]: [facility];  Service: Endoscopy;  Laterality: N/A;  220   ESOPHAGEAL DILATION N/A 03/03/2021   Procedure: ESOPHAGEAL DILATION;  Surgeon: Malissa Hippo, MD;  [facility]: [facility];  Service: Endoscopy;  Laterality: N/A;   ESOPHAGOGASTRODUODENOSCOPY (EGD) WITH PROPOFOL N/A 10/17/2018   Procedure: ESOPHAGOGASTRODUODENOSCOPY (EGD) WITH PROPOFOL;  Surgeon: Malissa Hippo, MD;  [facility];  Service: Endoscopy;  Laterality: N/A;  2:25   ESOPHAGOGASTRODUODENOSCOPY (EGD) WITH PROPOFOL N/A 03/03/2021   Procedure: ESOPHAGOGASTRODUODENOSCOPY (EGD) WITH PROPOFOL;  Surgeon: Malissa Hippo, MD;  [facility];  Service: Endoscopy;  Laterality: N/A;  12:15   ORIF ANKLE FRACTURE Left 03/10/2023   Procedure: OPEN REDUCTION INTERNAL FIXATION (ORIF) ANKLE FRACTURE;  Surgeon: [name], MD;  Location: [facility];  Service: Orthopedics;  Laterality: Left;   POLYPECTOMY  01/03/2022   Procedure: POLYPECTOMY INTESTINAL;  Surgeon: Malissa Hippo,  MD;  Location: [facility];  Service: Endoscopy;;   TUBAL LIGATION      Prior to Admission medications   Medication Sig Start Date End Date Taking? Authorizing Provider  albuterol (PROVENTIL) (2.5 MG/3ML) 0.083% nebulizer solution Take 3 mLs (2.5 mg total) by nebulization every 4 (four) hours as needed for wheezing or shortness of breath. 12/15/22 12/15/23 Yes Emokpae, Courage, MD  albuterol (VENTOLIN HFA) 108 (90 Base) MCG/ACT  inhaler Inhale 2 puffs into the lungs every 6 (six) hours as needed for wheezing or shortness of breath. 12/15/22  Yes Emokpae, Courage, MD  budesonide (PULMICORT) 0.5 MG/2ML nebulizer solution Inhale the contents of 1 vial via nebulizer two (2) times a day. 11/22/21  Yes   busPIRone (BUSPAR) 15 MG tablet Take 1 tablet (15 mg) by mouth 3 times daily. 05/06/23  Yes [name], MD  calcitonin, salmon, (MIACALCIN) 200 UNIT/ACT nasal spray Use 1 spray in one nostril per day, alternate nostrils every other day (may have 1 vial she should use this for at least 2 weeks but may use as long as 4 weeks) 03/01/23  Yes Luking, Jonna Coup, MD  doxycycline (VIBRA-TABS) 100 MG tablet Take 1 tablet (100 mg total) by mouth 2 (two) times daily. 05/06/23  Yes Luking, Jonna Coup, MD  DULoxetine (CYMBALTA) 60 MG capsule Take 1 capsule (60 mg) by mouth daily. 05/06/23  Yes Luking, Scott A, MD  EPINEPHrine 0.3 mg/0.3 mL IJ SOAJ injection Inject 0.3 mg into the muscle as needed for anaphylaxis. 01/05/22  Yes Luking, Jonna Coup, MD  ferrous sulfate 325 (65 FE) MG EC tablet Take 325 mg by mouth once a week.   Yes [provider]  hydrocortisone (CORTEF) 10 MG tablet Take 2 tablets by mouth at 8:00 am and 1 tablet at lunch. (May double dose on sick days x 3-5 days) 03/28/23  Yes Nida, Denman George, MD  hydrOXYzine (ATARAX) 25 MG tablet Take 1 tablet (25 mg total) by mouth 3 (three) times daily as needed for anxiety. 04/30/23 04/29/24 Yes Luking, Scott A, MD  ipratropium-albuterol (DUONEB) 0.5-2.5 (3) MG/3ML SOLN Take 3 mLs by nebulization every 8 (eight) hours. 12/17/22  Yes   levothyroxine (SYNTHROID) 50 MCG tablet Take 1 tablet by mouth daily before breakfast. 06/10/23 12/07/23 Yes Nida, Denman George, MD  lidocaine (LIDODERM) 5 % Place 1 patch onto the skin daily. Remove & Discard patch within 12 hours or as directed by MD 04/09/23  Yes Sherryll Burger, Pratik D, DO  lidocaine (LIDODERM) 5 % Place 1 patch onto the skin daily. Remove and discard  within 12 hours as directed by MD 04/02/23  Yes Kommor, Madison, MD  linaclotide [facility]) 290 MCG CAPS capsule Take 1 capsule (290 mcg total) by mouth daily before breakfast. 03/28/22  Yes Luking, Jonna Coup, MD  meclizine (ANTIVERT) 25 MG tablet Take 1 tablet (25 mg total) by mouth 2 (two) times daily as needed for dizziness. 06/18/23  Yes Luking, Jonna Coup, MD  methylPREDNISolone sodium succinate (SOLU-MEDROL, PF,) 125 MG injection ACT-O-VIAL Administer as directed.  Giving IV is preferred, but can also be given IM if IV access is difficult. 06/04/23  Yes Nida, Denman George, MD  midodrine (PROAMATINE) 5 MG tablet Take 1 tablet (5 mg total) by mouth 2 (two) times daily with a meal. 06/19/23 06/18/24 Yes Luking, Jonna Coup, MD  naloxone [facility]) nasal spray 4 mg/0.1 mL Use as directed for opioid overdose 12/26/22  Yes Luking, Jonna Coup, MD  ondansetron (ZOFRAN-ODT) 8 MG  disintegrating tablet Take 8 mg by mouth every 8 (eight) hours as needed for nausea or vomiting.   Yes [provider]  ondansetron (ZOFRAN-ODT) 8 MG disintegrating tablet Dissolve 1 tablet (8 mg total) by mouth every 8 (eight) hours as needed. 04/23/23 04/22/24 Yes [name], MD  oxyCODONE-acetaminophen (PERCOCET) 10-325 MG tablet Take 1 tablet every 4 hours as needed for pain-- maximum 6 tablets per day 06/14/23 05/06/23  Yes Luking, Jonna Coup, MD  pantoprazole (PROTONIX) 40 MG tablet Take 1 tablet (40 mg total) by mouth 2 (two) times daily before a meal. 12/15/22 12/15/23 Yes Emokpae, Courage, MD  predniSONE (DELTASONE) 5 MG tablet Take 1 tablet (5 mg total) by mouth in the morning. 07/04/22  Yes   prochlorperazine (COMPAZINE) 25 MG suppository Place 1 suppository (25 mg total) rectally every 12 (twelve) hours as needed for nausea or vomiting. 06/02/23  Yes Cardama, Amadeo Garnet, MD  prochlorperazine (COMPAZINE) 5 MG tablet Take 1 tablet (5 mg total) by mouth 2 (two) times daily as needed for nausea or vomiting. 06/07/23  Yes Tanda Rockers A,  DO  rosuvastatin (CRESTOR) 10 MG tablet Take 1 tablet (10 mg total) by mouth daily. 07/03/22  Yes Luking, Jonna Coup, MD  senna-docusate (SENOKOT-S) 8.6-50 MG tablet Take 1 tablet by mouth 2 (two) times daily between meals as needed for mild constipation. 03/13/23  Yes Almon Hercules, MD    Allergies as of 06/25/2023 - Review Complete 06/25/2023  Allergen Reaction Noted   Bee venom Anaphylaxis 09/11/2012   Contrast media [iodinated contrast media]  01/26/2020   Bactrim [sulfamethoxazole-trimethoprim] Nausea And Vomiting 01/08/2020   Codeine Itching    Morphine and codeine Nausea And Vomiting and Other (See Comments) 09/27/2014    Family History  Problem Relation Age of Onset   Cardiomyopathy Mother    Breast cancer Mother    Prostate cancer Father    Heart attack Maternal Grandmother    Heart attack Maternal Grandfather    Heart attack Paternal Grandmother    Bipolar disorder Daughter    Heart disease Maternal Aunt    Neurofibromatosis Cousin    Colon cancer Neg Hx     Social History   Socioeconomic History   Marital status: Married    Spouse name: Not on file   Number of children: 1   Years of education: Not on file   Highest education level: Not on file  Occupational History   Occupation: Educational psychologist: [employer]  Tobacco Use   Smoking status: Never    Passive exposure: Never   Smokeless tobacco: Never  Vaping Use   Vaping status: Never Used  Substance and Sexual Activity   Alcohol use: No   Drug use: No   Sexual activity: Yes    Birth control/protection: Post-menopausal  Other Topics Concern   Not on file  Social History Narrative   Daily caffeine    Living with husband   Right handed   She has been on disability since 2014.    Social Determinants of Health   Financial Resource Strain: Low Risk  (09/21/2022)   Overall Financial Resource Strain (CARDIA)    Difficulty of Paying Living Expenses: Not hard at all  Food Insecurity: No Food  Insecurity (04/07/2023)   Hunger Vital Sign    Worried About Running Out of Food in the Last Year: Never true    Ran Out of Food in the Last Year: Never true  Transportation Needs: No Transportation  Needs (04/07/2023)   PRAPARE - Administrator, Civil Service (Medical): No    Lack of Transportation (Non-Medical): No  Physical Activity: Inactive (09/21/2022)   Exercise Vital Sign    Days of Exercise per Week: 0 days    Minutes of Exercise per Session: 0 min  Stress: No Stress Concern Present (01/14/2023)   [employer] of Occupational Health - Occupational Stress Questionnaire    Feeling of Stress : Only a little  Social Connections: Socially Integrated (09/21/2022)   Social Connection and Isolation Panel [NHANES]    Frequency of Communication with Friends and Family: More than three times a week    Frequency of Social Gatherings with Friends and Family: Three times a week    Attends Religious Services: 1 to 4 times per year    Active Member of Clubs or Organizations: No    Attends Banker Meetings: 1 to 4 times per year    Marital Status: Married  Catering manager Violence: Not At Risk (04/07/2023)   Humiliation, Afraid, Rape, and Kick questionnaire    Fear of Current or Ex-Partner: No    Emotionally Abused: No    Physically Abused: No    Sexually Abused: No    Review of Systems: See HPI, otherwise negative ROS  Physical Exam: BP 136/76 (BP Location: Left Arm, Patient Position: Sitting, Cuff Size: Large)   Pulse (!) 101   Temp (!) 97.5 F (36.4 C) (Oral)   Ht 5\' 10"  (1.778 m)   Wt 280 lb 6.4 oz (127.2 kg)   LMP 05/17/2016   SpO2 94%   BMI 40.23 kg/m  General: Chronically ill, cushingoid in appearance presents in a wheelchair with nasal O2 in place.  Accompanied by her husband.   Mouth: No thrush  neck:  Supple; no masses or thyromegaly. No significant cervical adenopathy. Lungs:  Clear throughout to auscultation.   No wheezes, crackles, or  rhonchi. No acute distress. Heart:  Regular rate and rhythm; no murmurs, clicks, rubs,  or gallops. Abdomen: Obese soft and nontender Pulses:  Normal pulses noted. Extremities:  Without clubbing or edema.  Impression/Plan: 62 year old lady with advanced interstitial lung disease.  Multiple comorbidities on chronic steroids, narcotics.  Recent doxycycline use.  Complains of difficulty swallowing.  But denies any type of pain with swallowing  Normal esophagus 2 years ago.  Consider consider the possibility of pill induced entry, Candida esophagitis of the presentation is somewhat atypical.  Increased perioperative risk of anesthesia in this setting.  I have recommended that she get a barium pill esophagram as soon as possible to further evaluate.  She may or may not need an EGD.  When I recommended we do this is soon as possible i.e. today.  Patient stated she wanted to go eat first.  For now, we will pursue a barium pill esophagram at patient's convenience.  Further recommendations to follow.     Notice: This dictation was prepared with Dragon dictation along with smaller phrase technology. Any transcriptional errors that result from this process are unintentional and may not be corrected upon review.

## 2023-06-25 NOTE — Patient Instructions (Signed)
It was nice to meet you today?  As discussed, we should do a barium pill esophagram to evaluate your throat and esophagus before considering an endoscopic evaluation as the risk of anesthesia is increased with your progressing lung disease  For now, continue Protonix 40 mg twice daily  We will pursue a barium pill esophagram as soon as can be arranged  Once I get the report back I will be in touch with you.

## 2023-06-26 ENCOUNTER — Telehealth: Payer: Self-pay

## 2023-06-26 NOTE — Telephone Encounter (Signed)
Neysa Bonito with [facility] [phone] needing verbal orders for PT plan of care.

## 2023-06-26 NOTE — Telephone Encounter (Signed)
May have verbal orders for physical therapy

## 2023-06-27 ENCOUNTER — Other Ambulatory Visit (HOSPITAL_COMMUNITY): Payer: Self-pay

## 2023-06-27 NOTE — Telephone Encounter (Signed)
Informed Kathleen [name] with [facility] [phone] with verbal orders for PT

## 2023-06-28 ENCOUNTER — Other Ambulatory Visit (HOSPITAL_COMMUNITY): Payer: Self-pay

## 2023-07-01 ENCOUNTER — Ambulatory Visit (HOSPITAL_COMMUNITY)
Admission: RE | Admit: 2023-07-01 | Discharge: 2023-07-01 | Disposition: A | Discharge status: Home / Self Care | Payer: HMO | Source: Ambulatory Visit | Attending: Internal Medicine | Admitting: Internal Medicine

## 2023-07-01 ENCOUNTER — Other Ambulatory Visit (HOSPITAL_COMMUNITY): Payer: Self-pay

## 2023-07-01 DIAGNOSIS — R1319 Other dysphagia: Secondary | ICD-10-CM | POA: Diagnosis not present

## 2023-07-01 DIAGNOSIS — R1310 Dysphagia, unspecified: Secondary | ICD-10-CM | POA: Diagnosis not present

## 2023-07-01 MED ORDER — SOLU-MEDROL (PF) 125 MG IJ SOLR
INTRAMUSCULAR | 1 refills | Status: DC
  Filled 2023-07-01: qty 1, 7d supply, fill #0
  Filled 2023-07-13: qty 1, 7d supply, fill #1

## 2023-07-02 ENCOUNTER — Other Ambulatory Visit (HOSPITAL_COMMUNITY): Payer: Self-pay

## 2023-07-02 ENCOUNTER — Telehealth: Payer: Self-pay | Admitting: Family Medicine

## 2023-07-02 ENCOUNTER — Telehealth: Payer: Self-pay | Admitting: Orthopedic Surgery

## 2023-07-02 MED ORDER — DOXYCYCLINE HYCLATE 100 MG PO TABS: ORAL_TABLET | ORAL | 0 refills | Status: DC

## 2023-07-02 NOTE — Telephone Encounter (Signed)
Leah (PT) from [facility] called to report pt took off boot from foor. Tacey Ruiz states pt has been doing transfers without boot and pt states she dont feel safe with it out. Leah wanted to inform Dr. Christell Constant. Leah secure phone number is [phone].

## 2023-07-02 NOTE — Telephone Encounter (Signed)
Doxycycline 100 mg 1 twice daily #14 take with a snack tall glass of liquids, if the area becomes significantly enlarged may need to go to ER to get drained Warm compresses frequently Follow-up if any ongoing troubles or not improving

## 2023-07-02 NOTE — Telephone Encounter (Signed)
See MyChart message

## 2023-07-02 NOTE — Telephone Encounter (Signed)
Patient is requesting something for abscess under her arm and in her nose . [facility]

## 2023-07-03 ENCOUNTER — Other Ambulatory Visit (HOSPITAL_COMMUNITY): Payer: Self-pay

## 2023-07-03 NOTE — Telephone Encounter (Signed)
I called Leah and advised her of Dr. Kathi Der message an she will call and let the patient know.

## 2023-07-04 ENCOUNTER — Telehealth: Payer: Self-pay | Admitting: Orthopedic Surgery

## 2023-07-04 ENCOUNTER — Other Ambulatory Visit: Payer: Self-pay | Admitting: *Deleted

## 2023-07-04 DIAGNOSIS — K219 Gastro-esophageal reflux disease without esophagitis: Secondary | ICD-10-CM

## 2023-07-04 DIAGNOSIS — R1319 Other dysphagia: Secondary | ICD-10-CM

## 2023-07-04 NOTE — Telephone Encounter (Signed)
Leah (PT) from [facility] called with updates about pt. Pt was ore compliant to day with wearing [place]. States pt states she will be more compliant with boot. Pt still feel unsafe with it on but will wear. No call back needed to [facility]. Leah phone number is [phone] if needed.

## 2023-07-05 ENCOUNTER — Other Ambulatory Visit: Payer: Self-pay | Admitting: *Deleted

## 2023-07-05 ENCOUNTER — Telehealth: Payer: Self-pay | Admitting: *Deleted

## 2023-07-05 DIAGNOSIS — R1319 Other dysphagia: Secondary | ICD-10-CM

## 2023-07-05 DIAGNOSIS — K219 Gastro-esophageal reflux disease without esophagitis: Secondary | ICD-10-CM

## 2023-07-05 NOTE — Telephone Encounter (Signed)
Received vm from [facility] stating they could not get pt in until 12/11 and pt wanted to be seen sooner. She says to refer pt to speech therapy in [place].   Put in referral to speech therapy in [place] to see if pt could be seen sooner due to issues with aspiration

## 2023-07-09 ENCOUNTER — Other Ambulatory Visit (HOSPITAL_COMMUNITY): Payer: Self-pay | Admitting: Occupational Therapy

## 2023-07-09 DIAGNOSIS — K219 Gastro-esophageal reflux disease without esophagitis: Secondary | ICD-10-CM

## 2023-07-09 DIAGNOSIS — R1312 Dysphagia, oropharyngeal phase: Secondary | ICD-10-CM

## 2023-07-11 ENCOUNTER — Other Ambulatory Visit (HOSPITAL_COMMUNITY): Payer: Self-pay

## 2023-07-11 ENCOUNTER — Other Ambulatory Visit: Payer: Self-pay | Admitting: Family Medicine

## 2023-07-11 ENCOUNTER — Other Ambulatory Visit: Payer: Self-pay | Admitting: "Endocrinology

## 2023-07-11 MED ORDER — OXYCODONE-ACETAMINOPHEN 10-325 MG PO TABS
ORAL_TABLET | ORAL | 0 refills | Status: DC
  Filled 2023-07-11 – 2023-07-13 (×2): qty 180, 30d supply, fill #0

## 2023-07-11 MED ORDER — HYDROCORTISONE 10 MG PO TABS
ORAL_TABLET | ORAL | 3 refills | Status: DC
  Filled 2023-07-11: qty 120, 30d supply, fill #0
  Filled 2023-08-01: qty 120, 30d supply, fill #1
  Filled 2023-09-12: qty 120, 30d supply, fill #2
  Filled 2023-10-17: qty 120, 30d supply, fill #3

## 2023-07-12 ENCOUNTER — Other Ambulatory Visit (HOSPITAL_COMMUNITY): Payer: Self-pay

## 2023-07-13 ENCOUNTER — Other Ambulatory Visit (HOSPITAL_COMMUNITY): Payer: Self-pay

## 2023-07-16 ENCOUNTER — Ambulatory Visit (INDEPENDENT_AMBULATORY_CARE_PROVIDER_SITE_OTHER): Payer: HMO | Admitting: Family Medicine

## 2023-07-16 ENCOUNTER — Ambulatory Visit (INDEPENDENT_AMBULATORY_CARE_PROVIDER_SITE_OTHER): Payer: HMO | Admitting: Orthopedic Surgery

## 2023-07-16 ENCOUNTER — Other Ambulatory Visit (HOSPITAL_COMMUNITY): Payer: Self-pay

## 2023-07-16 ENCOUNTER — Ambulatory Visit (INDEPENDENT_AMBULATORY_CARE_PROVIDER_SITE_OTHER): Payer: HMO

## 2023-07-16 VITALS — BP 122/76 | HR 96 | Temp 97.5°F | Ht 70.0 in

## 2023-07-16 DIAGNOSIS — R600 Localized edema: Secondary | ICD-10-CM | POA: Diagnosis not present

## 2023-07-16 DIAGNOSIS — R1900 Intra-abdominal and pelvic swelling, mass and lump, unspecified site: Secondary | ICD-10-CM

## 2023-07-16 DIAGNOSIS — Z79899 Other long term (current) drug therapy: Secondary | ICD-10-CM

## 2023-07-16 DIAGNOSIS — R739 Hyperglycemia, unspecified: Secondary | ICD-10-CM | POA: Diagnosis not present

## 2023-07-16 DIAGNOSIS — R42 Dizziness and giddiness: Secondary | ICD-10-CM | POA: Diagnosis not present

## 2023-07-16 DIAGNOSIS — M87052 Idiopathic aseptic necrosis of left femur: Secondary | ICD-10-CM

## 2023-07-16 DIAGNOSIS — I272 Pulmonary hypertension, unspecified: Secondary | ICD-10-CM

## 2023-07-16 DIAGNOSIS — S82892D Other fracture of left lower leg, subsequent encounter for closed fracture with routine healing: Secondary | ICD-10-CM

## 2023-07-16 DIAGNOSIS — R7303 Prediabetes: Secondary | ICD-10-CM | POA: Diagnosis not present

## 2023-07-16 DIAGNOSIS — R0602 Shortness of breath: Secondary | ICD-10-CM | POA: Diagnosis not present

## 2023-07-16 MED ORDER — PROCHLORPERAZINE MALEATE 5 MG PO TABS: 5.0000 mg | ORAL_TABLET | Freq: Two times a day (BID) | ORAL | 2 refills | Status: DC | PRN

## 2023-07-16 MED ORDER — OXYCODONE-ACETAMINOPHEN 10-325 MG PO TABS
1.0000 | ORAL_TABLET | ORAL | 0 refills | Status: DC | PRN
  Filled 2023-07-16: qty 180, fill #0
  Filled 2023-08-12: qty 180, 30d supply, fill #0

## 2023-07-16 MED ORDER — OXYCODONE-ACETAMINOPHEN 10-325 MG PO TABS
1.0000 | ORAL_TABLET | ORAL | 0 refills | Status: DC | PRN
Start: 2023-07-16 — End: 2023-11-07
  Filled 2023-07-16: qty 180, fill #0
  Filled 2023-09-12 – 2023-10-11 (×2): qty 180, 30d supply, fill #0

## 2023-07-16 MED ORDER — ROSUVASTATIN CALCIUM 10 MG PO TABS
10.0000 mg | ORAL_TABLET | Freq: Every day | ORAL | 1 refills | Status: DC
  Filled 2023-07-16: qty 90, 90d supply, fill #0

## 2023-07-16 MED ORDER — OXYCODONE-ACETAMINOPHEN 10-325 MG PO TABS
1.0000 | ORAL_TABLET | ORAL | 0 refills | Status: DC | PRN
Start: 2023-09-11 — End: 2023-12-07
  Filled 2023-07-16: qty 180, fill #0
  Filled 2023-09-12: qty 180, 30d supply, fill #0

## 2023-07-16 MED ORDER — FUROSEMIDE 20 MG PO TABS
20.0000 mg | ORAL_TABLET | ORAL | 3 refills | Status: DC
  Filled 2023-07-16: qty 40, 93d supply, fill #0
  Filled 2023-10-11: qty 40, 93d supply, fill #1

## 2023-07-16 NOTE — Addendum Note (Signed)
Addended by: Willia Craze on: 07/16/2023 09:06 AM   Modules accepted: Level of Service

## 2023-07-16 NOTE — Progress Notes (Signed)
Subjective:    Patient ID: Kathleen [name], female    DOB: 1960/09/23, 62 y.o.   MRN: [id number]  Discussed the use of AI scribe software for clinical note transcription with the patient, who gave verbal consent to proceed.  History of Present Illness   The patient, with a history of lung disease and chronic pain, presents with concerns of developing diabetes due to the use of Solu Medrol. She reports a sensation of numbness and pins and needles in her feet, particularly the left foot, which she describes as feeling like standing on a water balloon. The patient uses a walker with a fold-down seat for mobility and reports occasional falls due to a lack of sensation in her feet.  The patient also reports a constant sensation of dizziness, described as feeling unsteady and lightheaded, which she believes may be due to vertigo. This dizziness often prevents her from standing or moving around and can last for several minutes before subsiding. She also reports persistent headaches, which began approximately a month ago and are most prevalent in the morning.  The patient has been experiencing chronic pain due to a broken back and hip, and a recently broken foot. She reports that her pain medication helps to some degree, allowing her to function. She also reports a feeling of bloating in her stomach, which has been present for as long as the swelling in her feet.  The patient has been using a nebulizer with five different medications, which she reports has been difficult to manage. She also reports taking methotrexate and meclizine, the latter of which she believes may be contributing to her dizziness. She has been prescribed Compazine for nausea and is taking Tylenol for her headaches.  The patient has been using oxygen therapy at night and is trying to qualify for an [employer] portable oxygen concentrator. She reports that her breathing has been good, but she experiences shortness of breath when moving  around. She also reports that she has been diagnosed with aspiration and is scheduled for a repeat swallowing test.         Review of Systems     Objective:    Physical Exam   VITALS: BP- 122/76 CHEST: lungs clear to auscultation, no crackles CARDIOVASCULAR: heart sounds normal NEUROLOGICAL: Decreased sensation to light touch in both lower extremities, absent sensation to light touch in hands with eyes closed, preserved sensation to light touch in upper extremities with eyes open.           Assessment & Plan:  Assessment and Plan    Peripheral Neuropathy Numbness and tingling in the lower extremities, with falls due to loss of sensation. -Continue current management.  Chronic Pain Ongoing hip and back pain, managed with pain medication. -Continue current pain management regimen. -Schedule follow-up in 3-3.5 months with pain management.  Dizziness Reports of constant dizziness, possibly due to medication side effects or orthostatic hypotension. -Discontinue Meclizine due to risk of drowsiness and increased fall risk. -Consider evaluation for orthostatic hypotension.  Potential Sleep Apnea Morning headaches, possibly due to undiagnosed sleep apnea. -Refer for sleep study.  Edema Reports of fluid in legs. -Start Furosemide 3 times a week (Monday, Wednesday, Friday). -Encourage increased activity and elevation of legs when sitting. -Check kidney function and heart function 7-10 days after starting Furosemide.  Potential Diabetes Recent blood work showed elevated sugars. -Order Hemoglobin A1c to evaluate for diabetes.  Abdominal Distension Reports of bloating and discomfort in the abdomen. -Order abdominal ultrasound to evaluate  for fluid.  General Health Maintenance / Followup Plans -Follow-up in 3-4 weeks to re-evaluate current issues. -Draw labs 7-10 days after starting Furosemide. -Schedule ultrasound of abdomen. -Continue current medications, with  discontinuation of Meclizine. -Send prescription for Compazine for nausea to [facility].      1. Dizziness I do not feel she has true vertigo I would not recommend meclizine I would recommend careful movement because of her instability as well as her previous fractures blood pressure stable  2. Avascular necrosis of bone of hip, left [facility]) May use pain medication maximum 6/day she has been on this for years  The patient was seen in followup for chronic pain. A review over at their current pain status was discussed. Drug registry was checked. Prescriptions were given.  Regular follow-up recommended. Discussion was held regarding the importance of compliance with medication as well as pain medication contract.  Patient was informed that medication may cause drowsiness and should not be combined  with other medications/alcohol or street drugs. If the patient feels medication is causing altered alertness then do not drive or operate dangerous equipment.  Should be noted that the patient appears to be meeting appropriate use of opioids and response.  Evidenced by improved function and decent pain control without significant side effects and no evidence of overt aberrancy issues.  Upon discussion with the patient today they understand that opioid therapy is optional and they feel that the pain has been refractory to reasonable conservative measures and is significant and affecting quality of life enough to warrant ongoing therapy and wishes to continue opioids.  Refills were provided.  [facility] medical Board guidelines regarding the pain medicine has been reviewed.  CDC guidelines most updated 2022 has been reviewed by the prescriber.  PDMP is checked on a regular basis yearly urine drug screen and pain management contract  Pill count was completed today and is accurate 3. SOB (shortness of breath) She does have swelling in her feet we will check BMP - Brain natriuretic peptide  4. Pedal  edema Swelling in her feet diuretic 3 days a week check lab work 7 to 10 days after starting diuretic follow-up in 4 weeks to recheck  5. Prediabetes Glucose is running high check A1c may be developing diabetes because of underlying steroid use - Hemoglobin A1c  6. Hyperglycemia Healthy diet regular activity and her activity is limited  7. Abdominal swelling Patient relates swelling in the abdomen because of her swelling in the abdomen as well as the swelling in her ankles we will do ultrasound the abdomen to rule out the possibility of a ascites issue - Brain natriuretic peptide  8. High risk medication use See above - Basic Metabolic Panel  9. Pulmonary HTN (HCC) Stable currently no sign of pneumonia on exam - Basic Metabolic Panel

## 2023-07-16 NOTE — Progress Notes (Addendum)
Orthopedic Surgery Post-operative Office Visit   Procedure: Left ankle fracture ORIF Date of Surgery: 03/10/2023 (~4 months post-op)   Assessment: Patient is a 62 y.o. who is doing well after surgery     Plan: -Can transition to weight bearing as tolerated in regular shoes as able -Can submerge wound at this time -Return to office in 2 months, x-rays needed at next visit: AP/lateral/mortise left ankle   ___________________________________________________________________________     Subjective: Patient has had difficulty in the boot.  She feels like she is tripping in the boot.  She is transitioned herself to walking outside of the boot at home.  She is still working with physical therapy.  She is not having any pain in her ankle.  She has not noticed any redness or drainage around her incision.  She has no complaints at this time.   Objective:   General: no acute distress, appropriate affect Neurologic: alert, answering questions appropriately, following commands Respiratory: unlabored breathing on room air Skin: Incisions over the medial and lateral aspect of the ankle are well-healed   MSK (LLE): Dorsiflexion to neutral, plantarflexion to 30 degrees, no pain through active ankle range of motion, dorsiflexes and plantarflexes toes, sensation intact to light touch in sural/saphenous/deep peroneal/superficial peroneal/tibial nerve distributions, foot warm and well-perfused     Imaging: X-rays of the left ankle taken 07/16/2023 were independently reviewed and interpreted, showing slight lateral translation at the medial and lateral malleolus fractures. No new fractures. No screws seen backing out. No lucency around the screws. Alignment unchanged from prior films, but still increased translation from intra-op fluoroscopy.      Patient name: CHESLEA BREYER Patient MRN: [id number] Date of visit: 07/16/23

## 2023-07-17 ENCOUNTER — Other Ambulatory Visit (HOSPITAL_COMMUNITY): Payer: Self-pay

## 2023-07-17 NOTE — Addendum Note (Signed)
Addended byTrinidad Curet on: 07/17/2023 08:57 AM   Modules accepted: Orders

## 2023-07-18 ENCOUNTER — Other Ambulatory Visit: Payer: Self-pay | Admitting: Family Medicine

## 2023-07-18 ENCOUNTER — Other Ambulatory Visit (HOSPITAL_COMMUNITY): Payer: Self-pay

## 2023-07-18 DIAGNOSIS — J849 Interstitial pulmonary disease, unspecified: Secondary | ICD-10-CM | POA: Diagnosis not present

## 2023-07-18 DIAGNOSIS — R0602 Shortness of breath: Secondary | ICD-10-CM | POA: Diagnosis not present

## 2023-07-18 DIAGNOSIS — D860 Sarcoidosis of lung: Secondary | ICD-10-CM | POA: Diagnosis not present

## 2023-07-18 MED ORDER — DOXYCYCLINE HYCLATE 100 MG PO TABS
100.0000 mg | ORAL_TABLET | Freq: Two times a day (BID) | ORAL | 0 refills | Status: DC
  Filled 2023-07-18: qty 20, 10d supply, fill #0

## 2023-07-19 ENCOUNTER — Other Ambulatory Visit (HOSPITAL_COMMUNITY): Payer: Self-pay

## 2023-07-22 ENCOUNTER — Telehealth: Payer: Self-pay | Admitting: Orthopedic Surgery

## 2023-07-22 NOTE — Telephone Encounter (Signed)
I called and advised Kathleen [name] that according to the last OV Note on 07/16/23-- She can transition to weightbearing in a regular shoe as tolerated--Per gretchen she has not been wearing the boot and pt states that she has been released from care of Dr. Christell Constant.  I advised Kathleen [name] that he wanted to see her back in 2 months for a recheck. Kathleen [name] states that she will let her know about this, but states that pt did great for her on Friday, but that her breathing was holding back from doing a lot.

## 2023-07-22 NOTE — Telephone Encounter (Signed)
Vinnie Langton (PT) from [facility] called requesting a call back on her secure line for clarification on pt and if she has been release from wearing her boot on her foot. Please call Gretchen at [phone].

## 2023-07-24 ENCOUNTER — Ambulatory Visit (HOSPITAL_COMMUNITY)
Admission: RE | Admit: 2023-07-24 | Discharge: 2023-07-24 | Disposition: A | Discharge status: Home / Self Care | Payer: HMO | Source: Ambulatory Visit | Attending: Internal Medicine | Admitting: Internal Medicine

## 2023-07-24 ENCOUNTER — Other Ambulatory Visit: Payer: Self-pay | Admitting: "Endocrinology

## 2023-07-24 ENCOUNTER — Ambulatory Visit (HOSPITAL_COMMUNITY): Payer: HMO | Attending: Family Medicine | Admitting: Speech Pathology

## 2023-07-24 ENCOUNTER — Encounter (HOSPITAL_COMMUNITY): Payer: Self-pay | Admitting: Speech Pathology

## 2023-07-24 ENCOUNTER — Other Ambulatory Visit (HOSPITAL_COMMUNITY): Payer: Self-pay

## 2023-07-24 DIAGNOSIS — R1319 Other dysphagia: Secondary | ICD-10-CM | POA: Diagnosis not present

## 2023-07-24 DIAGNOSIS — R1312 Dysphagia, oropharyngeal phase: Secondary | ICD-10-CM

## 2023-07-24 DIAGNOSIS — K219 Gastro-esophageal reflux disease without esophagitis: Secondary | ICD-10-CM | POA: Insufficient documentation

## 2023-07-24 DIAGNOSIS — R1310 Dysphagia, unspecified: Secondary | ICD-10-CM | POA: Diagnosis not present

## 2023-07-24 MED ORDER — SOLU-MEDROL (PF) 125 MG IJ SOLR
INTRAMUSCULAR | 1 refills | Status: DC
  Filled 2023-07-24: qty 1, 7d supply, fill #0
  Filled 2023-08-19: qty 1, 7d supply, fill #1

## 2023-07-24 NOTE — Therapy (Signed)
[facility] Out[facility] Rehabilitation at [facility] [address] [place], [place], [address] Phone: [phone]   Fax:  [phone]  Modified Barium Swallow  Patient Details  Name: Kathleen [name] MRN: [id number] Date of Birth: [birth year] No data recorded  Encounter Date: 07/24/2023   End of Session - 07/24/23 1510     Visit Number 1    Number of Visits 1    Activity Tolerance Patient tolerated treatment well            Modified Barium Swallow Study  Patient Details  Name: Kathleen [name] MRN: [id number] Date of Birth: December 12, 1960  Today's Date: 07/24/2023  HPI/PMH: HPI: Kathleen [name] is a 62 y/o female with pertinent medical hx of anxiety, COPD, GERD, hx of esophageal dilation ~2 years ago, restrictive lung disease, fibromyalgia, SOB, and chronic O2 dependence (currently on 4L/mn). Pt was referred for MBSS by Dr. Jena Gauss after Pt aspirated a small amount of barium with a single sip swallow on a barium pill esophagus assessment on 07/01/2023; the procedure was terminated secondary to the Pt's inability to safely stand for the remainder of the procedure.   Clinical Impression: Pt's oropharyngeal swallowing was presented to be essentially within functional limits on today's assessment. No penetration or aspiration of any textures or consistencies was observed. Pt with known hx of esophageal dysphagia and observed trace amounts of retrograde flow of various textures back into the pyriform sinuses however a reflexive or cued repeat swallow was effective for clearing retrograde material. Occasional trace oral and pharyngeal residue was visualized, however, overall Pt with efficient and timely oral stage, adequate laryngeal stripping wave and adequate laryngeal vestibule closure and good airway protection. Note Pt is at risk for aspiration given compromised respiratory status, anxiety and GERD. Esophageal sweep revealed standing column of barium with to and fro movement.  Radiologist present to confirm. SLP reviewed energy conservation strategies including slow consumption rate and taking breaks to breathe. Recommend continue standard esophageal strategies and defer to GI for ongoing treatment of esophageal component. Ongoing ST is not recommended or indicated at this time. Pt reported "If I need to go to the hospital right now or puree my food I'll do that". SLP attempted to calm and support Pt educating her that she did not aspirate at all on this assessment and that increased anxiety could be negatively impacting her symptoms. Pt educated at length regarding the findings and results of this assessment. Thank you for this referral.  Factors that may increase risk of adverse event in presence of aspiration Rubye Oaks & Clearance Coots 2021): Factors that may increase risk of adverse event in presence of aspiration Rubye Oaks & Clearance Coots 2021): Respiratory or GI disease (Anxiety)   Recommendations/Plan: Swallowing Evaluation Recommendations Swallowing Evaluation Recommendations Recommendations: PO diet PO Diet Recommendation: Regular; Thin liquids (Level 0) Liquid Administration via: Cup; Straw Medication Administration: Whole meds with liquid Supervision: Patient able to self-feed Swallowing strategies  : Slow rate; Small bites/sips Postural changes: Stay upright 30-60 min after meals; Position pt fully upright for meals Oral care recommendations: Oral care BID (2x/day)    Treatment Plan Treatment Plan Treatment recommendations: No treatment recommended at this time Follow-up recommendations: No SLP follow up     Recommendations Recommendations for follow up therapy are one component of a multi-disciplinary discharge planning process, led by the attending physician.  Recommendations may be updated based on patient status, additional functional criteria and insurance authorization.  Assessment: Orofacial Exam: Orofacial Exam Oral Cavity: Oral Hygiene: [facility] Oral  Cavity - Dentition: Adequate natural dentition Orofacial Anatomy: WFL Oral Motor/Sensory Function: WFL    Anatomy:  Anatomy: WFL   Boluses Administered: Boluses Administered Boluses Administered: Mildly thick liquids (Level 2, nectar thick); Thin liquids (Level 0); Puree; Moderately thick liquids (Level 3, honey thick); Solid     Oral Impairment Domain: Oral Impairment Domain Lip Closure: No labial escape Tongue control during bolus hold: Cohesive bolus between tongue to palatal seal Bolus preparation/mastication: Timely and efficient chewing and mashing Bolus transport/lingual motion: Brisk tongue motion Oral residue: Trace residue lining oral structures Location of oral residue : Tongue Initiation of pharyngeal swallow : Posterior angle of the ramus; Valleculae     Pharyngeal Impairment Domain: Pharyngeal Impairment Domain Soft palate elevation: No bolus between soft palate (SP)/pharyngeal wall (PW) Laryngeal elevation: Partial superior movement of thyroid cartilage/partial approximation of arytenoids to epiglottic petiole Anterior hyoid excursion: Complete anterior movement Epiglottic movement: Complete inversion Laryngeal vestibule closure: Complete, no air/contrast in laryngeal vestibule Pharyngeal stripping wave : Present - diminished Pharyngeal contraction (A/P view only): N/A Pharyngoesophageal segment opening: Partial distention/partial duration, partial obstruction of flow Tongue base retraction: No contrast between tongue base and posterior pharyngeal wall (PPW) Pharyngeal residue: Trace residue within or on pharyngeal structures Location of pharyngeal residue: Pyriform sinuses     Esophageal Impairment Domain: Esophageal Impairment Domain Esophageal clearance upright position: Esophageal retention with retrograde flow through the PES; Esophageal retention with retrograde flow below pharyngoesophageal segment (PES)    Pill: Pill Consistency administered:  Thin liquids (Level 0); Puree Thin liquids (Level 0): WFL Puree: WFL    Penetration/Aspiration Scale Score: Penetration/Aspiration Scale Score 1.  Material does not enter airway: Thin liquids (Level 0); Mildly thick liquids (Level 2, nectar thick); Moderately thick liquids (Level 3, honey thick); Puree; Solid; Pill    Compensatory Strategies: Compensatory Strategies Compensatory strategies: No       General Information: Caregiver present: No   Diet Prior to this Study: Regular; Thin liquids (Level 0)    Temperature : Normal    Respiratory Status: WFL    Supplemental O2: Nasal cannula    History of Recent Intubation: No   Behavior/Cognition: Alert; Cooperative; Pleasant mood  Self-Feeding Abilities: Able to self-feed  Baseline vocal quality/speech: Normal  Volitional Cough: Able to elicit  Volitional Swallow: Able to elicit  No data recorded  Goal Planning: No data recorded No data recorded No data recorded No data recorded No data recorded  Pain: No data recorded  End of Session: Start Time:No data recorded Stop Time: No data recorded Time Calculation:No data recorded Charges: No data recorded SLP visit diagnosis: SLP Visit Diagnosis: Dysphagia, unspecified (R13.10)    Past Medical History:  Past Medical History:  Diagnosis Date   Adrenal insufficiency (HCC)    Anemia    Anxiety    Avascular bone necrosis (HCC)    Breast fibrocystic disorder    Chronic pain    COPD (chronic obstructive pulmonary disease) (HCC)    Encounter for long-term opiate analgesic use 02/02/2020   Patient has chronic pain with femoral head avascular necrosis.  Is under pain contract   Fibromyalgia    Gastroesophageal reflux disease    Hypothyroidism    Mitral valve prolapse    Orthostatic hypotension    Peripheral neuropathy    PONV (postoperative nausea and vomiting)    Restrictive lung disease    Moderate to severe   Sarcoidosis    Biopsy proven - [facility]   SOB  (shortness of breath)  chronic   Past Surgical History:  Past Surgical History:  Procedure Laterality Date   BIOPSY  10/17/2018   Procedure: BIOPSY;  Surgeon: Malissa Hippo, MD;  Location: [facility];  Service: Endoscopy;;  duodenum, antrum, gastric body   BIOPSY  03/03/2021   Procedure: BIOPSY;  Surgeon: Malissa Hippo, MD;  Location: [facility];  Service: Endoscopy;;   BREAST LUMPECTOMY Bilateral 01/12/2011   CHOLECYSTECTOMY N/A 04/17/2021   Procedure: LAPAROSCOPIC CHOLECYSTECTOMY;  Surgeon: Franky Macho, MD;  Location: [facility];  Service: General;  Laterality: N/A;   COLONOSCOPY WITH PROPOFOL N/A 01/03/2022   Procedure: COLONOSCOPY WITH PROPOFOL;  Surgeon: Malissa Hippo, MD;  [facility]: [facility];  Service: Endoscopy;  Laterality: N/A;  220   ESOPHAGEAL DILATION N/A 03/03/2021   Procedure: ESOPHAGEAL DILATION;  Surgeon: Malissa Hippo, MD;  [facility]: [facility];  Service: Endoscopy;  Laterality: N/A;   ESOPHAGOGASTRODUODENOSCOPY (EGD) WITH PROPOFOL N/A 10/17/2018   Procedure: ESOPHAGOGASTRODUODENOSCOPY (EGD) WITH PROPOFOL;  Surgeon: Malissa Hippo, MD;  [facility]: [facility];  Service: Endoscopy;  Laterality: N/A;  2:25   ESOPHAGOGASTRODUODENOSCOPY (EGD) WITH PROPOFOL N/A 03/03/2021   Procedure: ESOPHAGOGASTRODUODENOSCOPY (EGD) WITH PROPOFOL;  Surgeon: Malissa Hippo, MD;  [facility];  Service: Endoscopy;  Laterality: N/A;  12:15   ORIF ANKLE FRACTURE Left 03/10/2023   Procedure: OPEN REDUCTION INTERNAL FIXATION (ORIF) ANKLE FRACTURE;  Surgeon: [name], MD;  Location: [facility];  Service: Orthopedics;  Laterality: Left;   POLYPECTOMY  01/03/2022   Procedure: POLYPECTOMY INTESTINAL;  Surgeon: Malissa Hippo, MD;  Location: [facility];  Service: Endoscopy;;   TUBAL LIGATION     Rashika Bettes H. Romie Levee, CCC-SLP Speech Language Pathologist  Georgetta Haber 07/24/2023, 3:11 PM  Georgetta Haber, CCC-SLP 07/24/2023, 3:11 PM  [facility] Out[facility] Rehabilitation at [facility] [address] [place], [place], [address] Phone: [phone]   Fax:  [phone]  Name: THALIAH SILVEIRA MRN: [id number] Date of Birth: [birth year]

## 2023-07-26 ENCOUNTER — Ambulatory Visit (HOSPITAL_COMMUNITY): Payer: HMO | Attending: Family Medicine

## 2023-07-26 ENCOUNTER — Other Ambulatory Visit (HOSPITAL_COMMUNITY): Payer: Self-pay

## 2023-07-26 DIAGNOSIS — Z79899 Other long term (current) drug therapy: Secondary | ICD-10-CM | POA: Diagnosis not present

## 2023-07-26 DIAGNOSIS — I272 Pulmonary hypertension, unspecified: Secondary | ICD-10-CM | POA: Diagnosis not present

## 2023-07-26 DIAGNOSIS — R1900 Intra-abdominal and pelvic swelling, mass and lump, unspecified site: Secondary | ICD-10-CM | POA: Diagnosis not present

## 2023-07-26 DIAGNOSIS — R0602 Shortness of breath: Secondary | ICD-10-CM | POA: Diagnosis not present

## 2023-07-26 DIAGNOSIS — R7303 Prediabetes: Secondary | ICD-10-CM | POA: Diagnosis not present

## 2023-07-27 LAB — BASIC METABOLIC PANEL
BUN/Creatinine Ratio: 13 (ref 12–28)
BUN: 11 mg/dL (ref 8–27)
CO2: 24 mmol/L (ref 20–29)
Calcium: 10.6 mg/dL — ABNORMAL HIGH (ref 8.7–10.3)
Chloride: 97 mmol/L (ref 96–106)
Creatinine, Ser: 0.86 mg/dL (ref 0.57–1.00)
Glucose: 186 mg/dL — ABNORMAL HIGH (ref 70–99)
Potassium: 5.1 mmol/L (ref 3.5–5.2)
Sodium: 141 mmol/L (ref 134–144)
eGFR: 77 mL/min/{1.73_m2} (ref >59)

## 2023-07-27 LAB — BRAIN NATRIURETIC PEPTIDE: BNP: 14.5 pg/mL (ref 0.0–100.0)

## 2023-07-27 LAB — HEMOGLOBIN A1C
Est. average glucose Bld gHb Est-mCnc: 131 mg/dL
Hgb A1c MFr Bld: 6.2 % — ABNORMAL HIGH (ref 4.8–5.6)

## 2023-08-01 ENCOUNTER — Other Ambulatory Visit (HOSPITAL_COMMUNITY): Payer: Self-pay

## 2023-08-01 ENCOUNTER — Other Ambulatory Visit: Payer: Self-pay

## 2023-08-01 MED ORDER — PREDNISONE 5 MG PO TABS
5.0000 mg | ORAL_TABLET | Freq: Every morning | ORAL | 5 refills | Status: DC
  Filled 2023-08-01: qty 30, 30d supply, fill #0
  Filled 2023-08-31: qty 30, 30d supply, fill #1
  Filled 2023-09-30: qty 30, 30d supply, fill #2
  Filled 2023-10-24: qty 30, 30d supply, fill #3
  Filled 2023-11-22: qty 30, 30d supply, fill #4
  Filled 2023-12-30: qty 30, 30d supply, fill #5

## 2023-08-05 ENCOUNTER — Encounter (HOSPITAL_COMMUNITY): Payer: Self-pay | Admitting: *Deleted

## 2023-08-05 ENCOUNTER — Emergency Department (HOSPITAL_COMMUNITY): Payer: HMO

## 2023-08-05 ENCOUNTER — Other Ambulatory Visit: Payer: Self-pay

## 2023-08-05 ENCOUNTER — Ambulatory Visit
Admission: EM | Admit: 2023-08-05 | Discharge: 2023-08-05 | Disposition: A | Discharge status: Home / Self Care | Payer: HMO

## 2023-08-05 ENCOUNTER — Emergency Department (HOSPITAL_COMMUNITY)
Admission: EM | Admit: 2023-08-05 | Discharge: 2023-08-05 | Disposition: A | Discharge status: Home / Self Care | Payer: HMO | Attending: Emergency Medicine | Admitting: Emergency Medicine

## 2023-08-05 DIAGNOSIS — J441 Chronic obstructive pulmonary disease with (acute) exacerbation: Secondary | ICD-10-CM

## 2023-08-05 DIAGNOSIS — R0602 Shortness of breath: Secondary | ICD-10-CM

## 2023-08-05 DIAGNOSIS — R600 Localized edema: Secondary | ICD-10-CM | POA: Insufficient documentation

## 2023-08-05 DIAGNOSIS — Z20822 Contact with and (suspected) exposure to covid-19: Secondary | ICD-10-CM | POA: Insufficient documentation

## 2023-08-05 DIAGNOSIS — R079 Chest pain, unspecified: Secondary | ICD-10-CM

## 2023-08-05 DIAGNOSIS — R059 Cough, unspecified: Secondary | ICD-10-CM | POA: Diagnosis not present

## 2023-08-05 DIAGNOSIS — R590 Localized enlarged lymph nodes: Secondary | ICD-10-CM | POA: Diagnosis not present

## 2023-08-05 DIAGNOSIS — R109 Unspecified abdominal pain: Secondary | ICD-10-CM | POA: Diagnosis not present

## 2023-08-05 DIAGNOSIS — E878 Other disorders of electrolyte and fluid balance, not elsewhere classified: Secondary | ICD-10-CM | POA: Diagnosis not present

## 2023-08-05 DIAGNOSIS — J849 Interstitial pulmonary disease, unspecified: Secondary | ICD-10-CM | POA: Diagnosis not present

## 2023-08-05 DIAGNOSIS — R062 Wheezing: Secondary | ICD-10-CM | POA: Diagnosis not present

## 2023-08-05 DIAGNOSIS — J984 Other disorders of lung: Secondary | ICD-10-CM

## 2023-08-05 DIAGNOSIS — Z79899 Other long term (current) drug therapy: Secondary | ICD-10-CM | POA: Diagnosis not present

## 2023-08-05 DIAGNOSIS — R0789 Other chest pain: Secondary | ICD-10-CM | POA: Insufficient documentation

## 2023-08-05 DIAGNOSIS — E039 Hypothyroidism, unspecified: Secondary | ICD-10-CM | POA: Insufficient documentation

## 2023-08-05 DIAGNOSIS — E869 Volume depletion, unspecified: Secondary | ICD-10-CM | POA: Diagnosis not present

## 2023-08-05 DIAGNOSIS — R0989 Other specified symptoms and signs involving the circulatory and respiratory systems: Secondary | ICD-10-CM | POA: Diagnosis not present

## 2023-08-05 LAB — COMPREHENSIVE METABOLIC PANEL
ALT: 17 U/L (ref 0–44)
AST: 15 U/L (ref 15–41)
Albumin: 3.4 g/dL — ABNORMAL LOW (ref 3.5–5.0)
Alkaline Phosphatase: 56 U/L (ref 38–126)
Anion gap: 8 (ref 5–15)
BUN: 10 mg/dL (ref 6–20)
CO2: 32 mmol/L (ref 22–32)
Calcium: 9.1 mg/dL (ref 8.9–10.3)
Chloride: 96 mmol/L — ABNORMAL LOW (ref 98–111)
Creatinine, Ser: 0.75 mg/dL (ref 0.44–1.00)
GFR, Estimated: >60 mL/min (ref >60)
Glucose, Bld: 131 mg/dL — ABNORMAL HIGH (ref 70–99)
Potassium: 4.1 mmol/L (ref 3.5–5.1)
Sodium: 136 mmol/L (ref 135–145)
Total Bilirubin: 0.4 mg/dL (ref <1.2)
Total Protein: 6.2 g/dL — ABNORMAL LOW (ref 6.5–8.1)

## 2023-08-05 LAB — BLOOD GAS, VENOUS
Acid-Base Excess: 10.2 mmol/L — ABNORMAL HIGH (ref 0.0–2.0)
Bicarbonate: 38.2 mmol/L — ABNORMAL HIGH (ref 20.0–28.0)
Drawn by: 442
O2 Saturation: 59.5 %
Patient temperature: 36.8
pCO2, Ven: 66 mm[Hg] — ABNORMAL HIGH (ref 44–60)
pH, Ven: 7.37 (ref 7.25–7.43)
pO2, Ven: 36 mm[Hg] (ref 32–45)

## 2023-08-05 LAB — TROPONIN I (HIGH SENSITIVITY)
Troponin I (High Sensitivity): 5 ng/L (ref <18)
Troponin I (High Sensitivity): 4 ng/L (ref <18)

## 2023-08-05 LAB — RESP PANEL BY RT-PCR (RSV, FLU A&B, COVID)  RVPGX2
Influenza A by PCR: NEGATIVE
Influenza B by PCR: NEGATIVE
Resp Syncytial Virus by PCR: NEGATIVE
SARS Coronavirus 2 by RT PCR: NEGATIVE

## 2023-08-05 LAB — CBC
HCT: 30.6 % — ABNORMAL LOW (ref 36.0–46.0)
Hemoglobin: 9.3 g/dL — ABNORMAL LOW (ref 12.0–15.0)
MCH: 29.7 pg (ref 26.0–34.0)
MCHC: 30.4 g/dL (ref 30.0–36.0)
MCV: 97.8 fL (ref 80.0–100.0)
Platelets: 392 10*3/uL (ref 150–400)
RBC: 3.13 MIL/uL — ABNORMAL LOW (ref 3.87–5.11)
RDW: 15.4 % (ref 11.5–15.5)
WBC: 10.1 10*3/uL (ref 4.0–10.5)
nRBC: 0.2 % (ref 0.0–0.2)

## 2023-08-05 LAB — BRAIN NATRIURETIC PEPTIDE: B Natriuretic Peptide: 91 pg/mL (ref 0.0–100.0)

## 2023-08-05 LAB — D-DIMER, QUANTITATIVE: D-Dimer, Quant: 0.6 ug{FEU}/mL — ABNORMAL HIGH (ref 0.00–0.50)

## 2023-08-05 LAB — LIPASE, BLOOD: Lipase: 25 U/L (ref 11–51)

## 2023-08-05 MED ORDER — ALBUTEROL SULFATE (2.5 MG/3ML) 0.083% IN NEBU
2.5000 mg | INHALATION_SOLUTION | Freq: Once | RESPIRATORY_TRACT | Status: AC
  Administered 2023-08-05: 2.5 mg via RESPIRATORY_TRACT
  Filled 2023-08-05: qty 3

## 2023-08-05 MED ORDER — DIPHENHYDRAMINE HCL 25 MG PO CAPS
50.0000 mg | ORAL_CAPSULE | Freq: Once | ORAL | Status: AC
Start: 2023-08-05 — End: 2023-08-05

## 2023-08-05 MED ORDER — METHOCARBAMOL 500 MG PO TABS
1000.0000 mg | ORAL_TABLET | Freq: Once | ORAL | Status: AC
  Administered 2023-08-05: 1000 mg via ORAL
  Filled 2023-08-05: qty 2

## 2023-08-05 MED ORDER — METHYLPREDNISOLONE SODIUM SUCC 40 MG IJ SOLR
40.0000 mg | Freq: Once | INTRAMUSCULAR | Status: AC
Start: 2023-08-05 — End: 2023-08-05
  Administered 2023-08-05: 40 mg via INTRAVENOUS
  Filled 2023-08-05: qty 1

## 2023-08-05 MED ORDER — LIDOCAINE 5 % EX PTCH
1.0000 | MEDICATED_PATCH | CUTANEOUS | Status: DC
  Administered 2023-08-05: 1 via TRANSDERMAL
  Filled 2023-08-05: qty 1

## 2023-08-05 MED ORDER — IPRATROPIUM-ALBUTEROL 0.5-2.5 (3) MG/3ML IN SOLN
3.0000 mL | Freq: Once | RESPIRATORY_TRACT | Status: AC
  Administered 2023-08-05: 3 mL via RESPIRATORY_TRACT
  Filled 2023-08-05: qty 3

## 2023-08-05 MED ORDER — FENTANYL CITRATE PF 50 MCG/ML IJ SOSY
25.0000 ug | PREFILLED_SYRINGE | Freq: Once | INTRAMUSCULAR | Status: AC
Start: 2023-08-05 — End: 2023-08-05
  Administered 2023-08-05: 25 ug via INTRAVENOUS
  Filled 2023-08-05: qty 1

## 2023-08-05 MED ORDER — HYDROMORPHONE HCL 1 MG/ML IJ SOLN
1.0000 mg | Freq: Once | INTRAMUSCULAR | Status: AC
  Administered 2023-08-05: 1 mg via INTRAVENOUS
  Filled 2023-08-05: qty 1

## 2023-08-05 MED ORDER — DIPHENHYDRAMINE HCL 50 MG/ML IJ SOLN
50.0000 mg | Freq: Once | INTRAMUSCULAR | Status: AC
Start: 2023-08-05 — End: 2023-08-05
  Administered 2023-08-05: 50 mg via INTRAVENOUS
  Filled 2023-08-05: qty 1

## 2023-08-05 MED ORDER — KETOROLAC TROMETHAMINE 15 MG/ML IJ SOLN
15.0000 mg | Freq: Once | INTRAMUSCULAR | Status: AC
  Administered 2023-08-05: 15 mg via INTRAVENOUS
  Filled 2023-08-05: qty 1

## 2023-08-05 MED ORDER — IOHEXOL 350 MG/ML SOLN
100.0000 mL | Freq: Once | INTRAVENOUS | Status: AC | PRN
  Administered 2023-08-05: 100 mL via INTRAVENOUS

## 2023-08-05 MED ORDER — ACETAMINOPHEN 325 MG PO TABS
650.0000 mg | ORAL_TABLET | Freq: Once | ORAL | Status: AC
  Administered 2023-08-05: 650 mg via ORAL
  Filled 2023-08-05: qty 2

## 2023-08-05 MED ORDER — FENTANYL CITRATE PF 50 MCG/ML IJ SOSY
25.0000 ug | PREFILLED_SYRINGE | Freq: Once | INTRAMUSCULAR | Status: AC
  Administered 2023-08-05: 25 ug via INTRAVENOUS
  Filled 2023-08-05: qty 1

## 2023-08-05 NOTE — ED Triage Notes (Addendum)
Pt states she is feeling SOB, wheezing, cough, congestion, chest tightness that started 2 days ago. Pt is on 4 L of oxygen, but having hard time breathing. Taking her prescribed breathing treatments and mucinex with no relief of symptoms.   Provider notified and came to room.

## 2023-08-05 NOTE — ED Provider Notes (Signed)
[place] EMERGENCY DEPARTMENT AT [facility] ([facility]) Provider Note   CSN: [id number] Arrival date & time: 08/05/23  8657     History  Chief Complaint  Patient presents with   Chest Pain    Kathleen [name] is a 62 y.o. female.   Chest Pain   62 year old female presents emergency department with complaints of chest pain radiating to her back.  States that she has had symptoms constantly for the past 2 days.  States that she was sitting at her house 2 days ago when symptoms started.  States she is also been with worsening cough and feelings of shortness of breath.  Patient with history of sarcoidosis on 4 L nasal cannula at baseline.  States that her oxygen saturations have been okay at home with slight improvement with her breathing treatments but still with feelings of shortness of breath.  Patient states that she has been feeling like she has been wheezing more frequently over the past couple days.  Denies any fever but does report experiencing chills.  States that her primary care started her on "fluid pill" a couple weeks ago due to her lower extremity swelling that she states has helped but the swelling seems to return.  Denies any abdominal pain, nausea, vomiting.  Past medical history significant for sarcoidosis, restrictive lung disease, chronic respiratory failure on 4 L nasal cannula, adrenal insufficiency, peripheral neuropathy, MVP, hypothyroidism, opioid dependency, depression, interstitial lung disease  Home Medications Prior to Admission medications   Medication Sig Start Date End Date Taking? Authorizing Provider  albuterol (PROVENTIL) (2.5 MG/3ML) 0.083% nebulizer solution Take 3 mLs (2.5 mg total) by nebulization every 4 (four) hours as needed for wheezing or shortness of breath. 12/15/22 12/15/23 Yes Emokpae, Courage, MD  albuterol (VENTOLIN HFA) 108 (90 Base) MCG/ACT inhaler Inhale 2 puffs into the lungs every 6 (six) hours as needed for wheezing or shortness of breath.  12/15/22  Yes Emokpae, Courage, MD  budesonide (PULMICORT) 0.5 MG/2ML nebulizer solution Inhale the contents of 1 vial via nebulizer two (2) times a day. 11/22/21  Yes   busPIRone (BUSPAR) 15 MG tablet Take 1 tablet (15 mg) by mouth 3 times daily. 05/06/23  Yes Luking, Jonna Coup, MD  DULoxetine (CYMBALTA) 60 MG capsule Take 1 capsule (60 mg) by mouth daily. 05/06/23  Yes [name], MD  hydrOXYzine (ATARAX) 25 MG tablet Take 1 tablet (25 mg total) by mouth 3 (three) times daily as needed for anxiety. 06/25/23 06/24/24 Yes Luking, Scott A, MD  ipratropium-albuterol (DUONEB) 0.5-2.5 (3) MG/3ML SOLN Take 3 mLs by nebulization every 8 (eight) hours. 12/17/22  Yes   naloxone (NARCAN) nasal spray 4 mg/0.1 mL Use as directed for opioid overdose 12/26/22  Yes Luking, Jonna Coup, MD  oxyCODONE-acetaminophen (PERCOCET) 10-325 MG tablet Take 1 tablet by mouth every 4 (four) hours as needed for pain. Max 6 /day 07/16/23  Yes [name], MD  calcitonin, salmon, (MIACALCIN) 200 UNIT/ACT nasal spray Use 1 spray in one nostril per day, alternate nostrils every other day (may have 1 vial she should use this for at least 2 weeks but may use as long as 4 weeks) 03/01/23   Gerda Diss, Jonna Coup, MD  doxycycline (VIBRA-TABS) 100 MG tablet Take 1 tablet (100 mg total) by mouth 2 (two) times daily. 07/18/23   [name], MD  EPINEPHrine 0.3 mg/0.3 mL IJ SOAJ injection Inject 0.3 mg into the muscle as needed for anaphylaxis. 01/05/22   [name], MD  ferrous sulfate 325 (65 FE) MG EC tablet Take 325 mg by mouth once a week.    [provider]  furosemide (LASIX) 20 MG tablet Take 1 tablet (20 mg total) by mouth every Monday, Wednesday, and Friday. 07/17/23   [name], MD  hydrocortisone (CORTEF) 10 MG tablet Take 2 tablets by mouth at 8:00 am and 1 tablet at lunch. (May double dose on sick days x 3-5 days) 07/11/23   Dani Gobble, NP  levothyroxine (SYNTHROID) 50 MCG tablet Take 1 tablet by mouth daily  before breakfast. 06/10/23 12/07/23  Roma Kayser, MD  lidocaine (LIDODERM) 5 % Place 1 patch onto the skin daily. Remove & Discard patch within 12 hours or as directed by MD 04/09/23   Sherryll Burger, Pratik D, DO  lidocaine (LIDODERM) 5 % Place 1 patch onto the skin daily. Remove and discard within 12 hours as directed by MD 04/02/23   Kommor, Wyn Forster, MD  linaclotide [facility]) 290 MCG CAPS capsule Take 1 capsule (290 mcg total) by mouth daily before breakfast. 03/28/22   [name], MD  meclizine (ANTIVERT) 25 MG tablet Take 1 tablet (25 mg total) by mouth 2 (two) times daily as needed for dizziness. Patient not taking: Reported on 08/05/2023 06/18/23   [name], MD  methylPREDNISolone sodium succinate (SOLU-MEDROL, PF,) 125 MG injection ACT-O-VIAL Administer as directed.  Giving IV is preferred, but can also be given IM if IV access is difficult. 07/24/23   Roma Kayser, MD  midodrine (PROAMATINE) 5 MG tablet Take 1 tablet (5 mg total) by mouth 2 (two) times daily with a meal. 06/19/23 06/18/24  [name], MD  ondansetron (ZOFRAN-ODT) 8 MG disintegrating tablet Take 8 mg by mouth every 8 (eight) hours as needed for nausea or vomiting.    [provider]  ondansetron (ZOFRAN-ODT) 8 MG disintegrating tablet Dissolve 1 tablet (8 mg total) by mouth every 8 (eight) hours as needed. 04/23/23 04/22/24  [name], MD  oxyCODONE-acetaminophen (PERCOCET) 10-325 MG tablet Take 1 tablet by mouth every 4 (four) hours as needed for pain. Max 6 per day 09/11/23   [name], MD  oxyCODONE-acetaminophen (PERCOCET) 10-325 MG tablet Take 1 tablet by mouth every 4 (four) hours as needed for pain. Max 6/day 08/12/23   [name], MD  pantoprazole (PROTONIX) 40 MG tablet Take 1 tablet (40 mg total) by mouth 2 (two) times daily before a meal. 12/15/22 12/15/23  Emokpae, Courage, MD  predniSONE (DELTASONE) 5 MG tablet Take 1 tablet (5 mg total) by mouth in the morning. 08/01/23      prochlorperazine (COMPAZINE) 25 MG suppository Place 1 suppository (25 mg total) rectally every 12 (twelve) hours as needed for nausea or vomiting. 06/02/23   Cardama, Amadeo Garnet, MD  prochlorperazine (COMPAZINE) 5 MG tablet Take 1 tablet (5 mg total) by mouth 2 (two) times daily as needed for nausea or vomiting. 07/16/23   [name], MD  rosuvastatin (CRESTOR) 10 MG tablet Take 1 tablet (10 mg total) by mouth daily. 07/16/23   [name], MD  senna-docusate (SENOKOT-S) 8.6-50 MG tablet Take 1 tablet by mouth 2 (two) times daily between meals as needed for mild constipation. 03/13/23   Almon Hercules, MD      Allergies    Bee venom, Diphenhydramine, Contrast media [iodinated contrast media], Bactrim [sulfamethoxazole-trimethoprim], Codeine, and Morphine and codeine    Review of Systems   Review of Systems  Cardiovascular:  Positive for chest pain.  All other systems reviewed and are negative.   Physical Exam Updated Vital Signs BP (!) 156/79   Pulse 90   Temp 98.6 F (37 C) (Oral)   Resp 18   Ht 5\' 10"  (1.778 m)   Wt 117.9 kg   LMP 05/17/2016   SpO2 97%   BMI 37.31 kg/m  Physical Exam Vitals and nursing note reviewed.  Constitutional:      General: She is in acute distress.     Appearance: She is well-developed.  HENT:     Head: Normocephalic and atraumatic.  Eyes:     Conjunctiva/sclera: Conjunctivae normal.  Cardiovascular:     Rate and Rhythm: Normal rate and regular rhythm.  Pulmonary:     Effort: Respiratory distress present.     Breath sounds: Wheezing present.     Comments: Diffuse wheeze bilateral lung fields.  Increased work of breathing.  No obvious hypoxia on patient's 4 L. Abdominal:     Palpations: Abdomen is soft.     Tenderness: There is no abdominal tenderness.  Musculoskeletal:     Cervical back: Neck supple.     Right lower leg: Edema present.     Left lower leg: Edema present.  Skin:    General: Skin is warm and dry.     Capillary  Refill: Capillary refill takes less than 2 seconds.  Neurological:     Mental Status: She is alert.  Psychiatric:        Mood and Affect: Mood normal.     ED Results / Procedures / Treatments   Labs (all labs ordered are listed, but only abnormal results are displayed) Labs Reviewed  CBC - Abnormal; Notable for the following components:      Result Value   RBC 3.13 (*)    Hemoglobin 9.3 (*)    HCT 30.6 (*)    All other components within normal limits  COMPREHENSIVE METABOLIC PANEL - Abnormal; Notable for the following components:   Chloride 96 (*)    Glucose, Bld 131 (*)    Total Protein 6.2 (*)    Albumin 3.4 (*)    All other components within normal limits  BLOOD GAS, VENOUS - Abnormal; Notable for the following components:   pCO2, Ven 66 (*)    Bicarbonate 38.2 (*)    Acid-Base Excess 10.2 (*)    All other components within normal limits  D-DIMER, QUANTITATIVE - Abnormal; Notable for the following components:   D-Dimer, Quant 0.60 (*)    All other components within normal limits  RESP PANEL BY RT-PCR (RSV, FLU A&B, COVID)  [id number]  BRAIN NATRIURETIC PEPTIDE  LIPASE, BLOOD  TROPONIN I (HIGH SENSITIVITY)  TROPONIN I (HIGH SENSITIVITY)    EKG EKG Interpretation Date/Time:  Monday August 05 2023 08:49:52 EST Ventricular Rate:  89 PR Interval:  135 QRS Duration:  73 QT Interval:  338 QTC Calculation: 412 R Axis:   67  Text Interpretation: Sinus rhythm Atrial premature complex Abnormal R-wave progression, early transition Borderline T wave abnormalities No significant change since last tracing Confirmed by Susy Frizzle [phone]) on 08/06/2023 9:13:16 AM  Radiology CT Angio Chest PE W/Cm &/Or Wo Cm  Result Date: 08/05/2023 CLINICAL DATA:  Short of breath, wheeze, cough and congestion, chest tightness for 2 days, abdominal pain EXAM: CT ANGIOGRAPHY CHEST CT ABDOMEN AND PELVIS WITH CONTRAST TECHNIQUE: Multidetector CT imaging of the chest was performed using the  standard protocol during bolus administration of intravenous contrast. Multiplanar  CT image reconstructions and MIPs were obtained to evaluate the vascular anatomy. Multidetector CT imaging of the abdomen and pelvis was performed using the standard protocol during bolus administration of intravenous contrast. RADIATION DOSE REDUCTION: This exam was performed according to the departmental dose-optimization program which includes automated exposure control, adjustment of the mA and/or kV according to patient size and/or use of iterative reconstruction technique. CONTRAST:  OMNIPAQUE IOHEXOL 350 MG/ML SOLN COMPARISON:  08/05/2023, 04/02/2023 FINDINGS: CTA CHEST FINDINGS Cardiovascular: This is a technically adequate evaluation of the pulmonary vasculature. No filling defects or pulmonary emboli. The heart is unremarkable without pericardial effusion. No evidence of thoracic aortic aneurysm or dissection. Stable atherosclerosis of the aortic arch. Mediastinum/Nodes: Stable appearance of the thyroid, trachea, and esophagus. Borderline enlarged right paratracheal lymph nodes are again seen and are stable, with index right paratracheal lymph node measuring 11 mm unchanged. Lungs/Pleura: Stable consolidation and volume loss within the upper lobes consistent with scarring. No acute airspace disease, effusion, or pneumothorax. Central airways are patent. Musculoskeletal: Prior healed bilateral rib fractures. Chronic T11 compression fracture unchanged. No acute or destructive bony abnormalities. Review of the MIP images confirms the above findings. CT ABDOMEN and PELVIS FINDINGS Hepatobiliary: No focal liver abnormality is seen. Status post cholecystectomy. No biliary dilatation. Pancreas: Stable fatty atrophy of the pancreatic head. No acute inflammatory changes or pancreatic duct dilation. Spleen: Normal in size without focal abnormality. Adrenals/Urinary Tract: Adrenal glands are unremarkable. Kidneys are normal,  without renal calculi, focal lesion, or hydronephrosis. Bladder is unremarkable. Stomach/Bowel: No bowel obstruction or ileus. No bowel wall thickening or inflammatory change. Vascular/Lymphatic: Aortic atherosclerosis. No enlarged abdominal or pelvic lymph nodes. Reproductive: Uterus and bilateral adnexa are unremarkable. Other: No free fluid or free intraperitoneal gas. No abdominal wall hernia. Musculoskeletal: Chronic left parasymphyseal and right sacral ala fractures are again noted. No acute bony abnormalities. Stable avascular necrosis of the left femoral head with evidence of mild subchondral collapse. Reconstructed images demonstrate no additional findings. Review of the MIP images confirms the above findings. IMPRESSION: 1. No evidence of pulmonary embolus. 2. Chronic parenchymal lung scarring, greatest in the upper lobes, stable. No acute airspace disease. 3. No acute intra-abdominal or intrapelvic process. 4.  Aortic Atherosclerosis (ICD10-I70.0). Electronically Signed   By: Sharlet Salina M.D.   On: 08/05/2023 17:01   CT ABDOMEN PELVIS W CONTRAST  Result Date: 08/05/2023 CLINICAL DATA:  Short of breath, wheeze, cough and congestion, chest tightness for 2 days, abdominal pain EXAM: CT ANGIOGRAPHY CHEST CT ABDOMEN AND PELVIS WITH CONTRAST TECHNIQUE: Multidetector CT imaging of the chest was performed using the standard protocol during bolus administration of intravenous contrast. Multiplanar CT image reconstructions and MIPs were obtained to evaluate the vascular anatomy. Multidetector CT imaging of the abdomen and pelvis was performed using the standard protocol during bolus administration of intravenous contrast. RADIATION DOSE REDUCTION: This exam was performed according to the departmental dose-optimization program which includes automated exposure control, adjustment of the mA and/or kV according to patient size and/or use of iterative reconstruction technique. CONTRAST:  OMNIPAQUE IOHEXOL  350 MG/ML SOLN COMPARISON:  08/05/2023, 04/02/2023 FINDINGS: CTA CHEST FINDINGS Cardiovascular: This is a technically adequate evaluation of the pulmonary vasculature. No filling defects or pulmonary emboli. The heart is unremarkable without pericardial effusion. No evidence of thoracic aortic aneurysm or dissection. Stable atherosclerosis of the aortic arch. Mediastinum/Nodes: Stable appearance of the thyroid, trachea, and esophagus. Borderline enlarged right paratracheal lymph nodes are again seen and are stable, with index right  paratracheal lymph node measuring 11 mm unchanged. Lungs/Pleura: Stable consolidation and volume loss within the upper lobes consistent with scarring. No acute airspace disease, effusion, or pneumothorax. Central airways are patent. Musculoskeletal: Prior healed bilateral rib fractures. Chronic T11 compression fracture unchanged. No acute or destructive bony abnormalities. Review of the MIP images confirms the above findings. CT ABDOMEN and PELVIS FINDINGS Hepatobiliary: No focal liver abnormality is seen. Status post cholecystectomy. No biliary dilatation. Pancreas: Stable fatty atrophy of the pancreatic head. No acute inflammatory changes or pancreatic duct dilation. Spleen: Normal in size without focal abnormality. Adrenals/Urinary Tract: Adrenal glands are unremarkable. Kidneys are normal, without renal calculi, focal lesion, or hydronephrosis. Bladder is unremarkable. Stomach/Bowel: No bowel obstruction or ileus. No bowel wall thickening or inflammatory change. Vascular/Lymphatic: Aortic atherosclerosis. No enlarged abdominal or pelvic lymph nodes. Reproductive: Uterus and bilateral adnexa are unremarkable. Other: No free fluid or free intraperitoneal gas. No abdominal wall hernia. Musculoskeletal: Chronic left parasymphyseal and right sacral ala fractures are again noted. No acute bony abnormalities. Stable avascular necrosis of the left femoral head with evidence of mild  subchondral collapse. Reconstructed images demonstrate no additional findings. Review of the MIP images confirms the above findings. IMPRESSION: 1. No evidence of pulmonary embolus. 2. Chronic parenchymal lung scarring, greatest in the upper lobes, stable. No acute airspace disease. 3. No acute intra-abdominal or intrapelvic process. 4.  Aortic Atherosclerosis (ICD10-I70.0). Electronically Signed   By: Sharlet Salina M.D.   On: 08/05/2023 17:01   DG Chest 2 View  Result Date: 08/05/2023 CLINICAL DATA:  Cough, shortness of breath, and wheezing for 2 days. EXAM: CHEST - 2 VIEW COMPARISON:  06/01/2023 FINDINGS: Low lung volumes again noted. Chronic pulmonary interstitial prominence in right upper lobe scarring again noted, consistent with chronic interstitial lung disease. No evidence of acute infiltrate or pleural effusion. IMPRESSION: Chronic interstitial lung disease. No acute findings. Electronically Signed   By: Danae Orleans M.D.   On: 08/05/2023 09:50    Procedures Procedures    Medications Ordered in ED Medications  ipratropium-albuterol (DUONEB) 0.5-2.5 (3) MG/3ML nebulizer solution 3 mL (3 mLs Nebulization Given 08/05/23 0907)  albuterol (PROVENTIL) (2.5 MG/3ML) 0.083% nebulizer solution 2.5 mg (2.5 mg Nebulization Given 08/05/23 0907)  fentaNYL (SUBLIMAZE) injection 25 mcg (25 mcg Intravenous Given 08/05/23 0917)  methylPREDNISolone sodium succinate (SOLU-MEDROL) 40 mg/mL injection 40 mg (40 mg Intravenous Given 08/05/23 1041)  diphenhydrAMINE (BENADRYL) capsule 50 mg ( Oral See Alternative 08/05/23 1454)    Or  diphenhydrAMINE (BENADRYL) injection 50 mg (50 mg Intravenous Given 08/05/23 1454)  albuterol (PROVENTIL) (2.5 MG/3ML) 0.083% nebulizer solution 2.5 mg (2.5 mg Nebulization Given 08/05/23 1106)  fentaNYL (SUBLIMAZE) injection 25 mcg (25 mcg Intravenous Given 08/05/23 1111)  acetaminophen (TYLENOL) tablet 650 mg (650 mg Oral Given 08/05/23 1136)  iohexol (OMNIPAQUE) 350 MG/ML injection 100  mL (100 mLs Intravenous Contrast Given 08/05/23 1509)  HYDROmorphone (DILAUDID) injection 1 mg (1 mg Intravenous Given 08/05/23 1627)  methocarbamol (ROBAXIN) tablet 1,000 mg (1,000 mg Oral Given 08/05/23 1913)  ketorolac (TORADOL) 15 MG/ML injection 15 mg (15 mg Intravenous Given 08/05/23 1913)    ED Course/ Medical Decision Making/ A&P Clinical Course as of 08/06/23 1517  Mon Aug 05, 2023  1052 Reevaluation of the patient showed improvement of work of breathing as well as patient's subjective improvement of dyspnea.  Awaiting CT imaging given elevated D-dimer. [CR]    Clinical Course User Index [CR] Peter Garter, PA  Medical Decision Making Amount and/or Complexity of Data Reviewed Labs: ordered. Radiology: ordered.  Risk OTC drugs. Prescription drug management.   This patient presents to the ED for concern of shortness of breath, cough, this involves an extensive number of treatment options, and is a complaint that carries with it a high risk of complications and morbidity.  The differential diagnosis includes ACS, PE, pneumothorax, CHF, COPD/asthma, aortic dissection, musculoskeletal, other   Co morbidities that complicate the patient evaluation  See HPI   Additional history obtained:  Additional history obtained from EMR External records from outside source obtained and reviewed including hospital records   Lab Tests:  I Ordered, and personally interpreted labs.  The pertinent results include: No leukocytosis.  Patient with evidence of anemia with hemoglobin 9.3 which is near patient's baseline.  Placed within range.  Mild hypocloremia of 96 but otherwise electrolytes within normal limits.  No transaminitis.  No renal dysfunction.  BNP normal at 91.  Troponin initially 5 with repeat 4.  D-dimer elevated at 0.6.   Imaging Studies ordered:  I ordered imaging studies including chest x-ray, CT angio chest PE, CT abdomen pelvis I  independently visualized and interpreted imaging which showed  Chest x-ray: No acute cardiopulmonary abnormality. CT angio chest PE: Pending CT abdomen pelvis: Pending I agree with the radiologist interpretation   Cardiac Monitoring: / EKG:  The patient was maintained on a cardiac monitor.  I personally viewed and interpreted the cardiac monitored which showed an underlying rhythm of: Sinus rhythm.  Nonspecific T changes.   Consultations Obtained:  I requested consultation with attending Dr. Elayne Snare who is in agreement with treatment plan going forward.   Problem List / ED Course / Critical interventions / Medication management  Chest pain, shortness of breath I ordered medication including DuoNeb, albuterol, Solu-Medrol, fentanyl, Tylenol, Benadryl   Reevaluation of the patient after these medicines showed that the patient improved I have reviewed the patients home medicines and have made adjustments as needed   Social Determinants of Health:  Denies tobacco, illicit drug use   Test / Admission - Considered:  Shortness of breath, Chest pain, Vitals signs significant for normal oxygen saturations on her at home 4 L nasal cannula.. Otherwise within normal range and stable throughout visit. Laboratory/imaging studies significant for: See above 62 year old female presents emergency department with complaints of chest pain rating to her back as well as shortness of breath.  Symptoms onset for the past 2 days.  Patient with history of sarcoidosis, interstitial lung disease on 4 L nasal cannula at baseline.  On exam initially, patient with diffuse wheezing noted bilaterally with subsequent improvement via nebulized breathing treatment. Labs reassuring besides slightly elevated D-dimer of 0.6.  Chest x-ray without obvious pneumonia or other acute abnormality.  Awaiting CT imaging at time of shift change.  At time of shift change, patient care handed off to [facility].  Patient stable upon  shift change.         Final Clinical Impression(s) / ED Diagnoses Final diagnoses:  Chest pain, unspecified type    Rx / DC Orders ED Discharge Orders     None         Peter Garter, [place] 08/06/23 1517    Royanne Foots, DO 08/11/23 2025

## 2023-08-05 NOTE — Discharge Instructions (Signed)
Please follow-up with pulmonologist in regards to recent ER visit.  Today your labs and imaging were all reassuring and this could be related to your chronic interstitial lung disease.  Please pick up the rest of the cortef that has been prescribed to you by your pulmonologist.  If symptoms change or worsen please return to the ER.

## 2023-08-05 NOTE — ED Provider Notes (Addendum)
Patient given in sign out by Sherian Maroon, PA-C.  Please review their note for patient HPI, physical exam, workup.  At this time the plan is follow-up on imaging and anticipate discharge if negative with pulmonology follow-up.  The rest of patient's labs and imaging were all reassuring.  This is most likely secondary to her chronic interstitial lung disease and will have her follow-up with her pulmonologist.  Patient does have refills on her Cortef and will encourage her to pick up the rest of her Cortef and follow-up with her pulmonologist.  Patient refused to be discharged at time of discharge saying that she did not feel comfortable going home.  Patient's vitals have been stable throughout her 10-hour ED stay and her labs and imaging are all reassuring.  Patient is demanding to speak with the attending and so attending was notified.  Attending spoke with the patient and will give lidocaine patch here and have her follow-up in the outpatient setting.  Patient stable to be discharged at this time.    Kathleen [name] 08/05/23 2030    Gloris Manchester, MD 08/05/23 [id number]

## 2023-08-05 NOTE — ED Triage Notes (Signed)
Pt c/o mid sternal chest pain that radiates around to her back with cough and sob  Pt has audible wheezing

## 2023-08-05 NOTE — ED Notes (Signed)
Attempted to d/c pt. Pt refuses to be discharged d/t not having CT results from the EDP and is uncomfortable being d/c. EDP went to bedside pt states with results of CT of chronic health problems not comfortable being d/c and wants the MD to speak with her. MD notified.

## 2023-08-05 NOTE — ED Notes (Signed)
Patient is being discharged from the Urgent Care and sent to the Emergency Department via [facility] . Per provider Faye Ramsay, patient is in need of higher level of care due to SOB, chest pain. Patient is aware and verbalizes understanding of plan of care.  Vitals:   08/05/23 0827  BP: 123/73  Pulse: 88  Resp: (!) 28  SpO2: 96%

## 2023-08-07 ENCOUNTER — Ambulatory Visit (HOSPITAL_COMMUNITY): Payer: HMO | Admitting: Speech Pathology

## 2023-08-08 ENCOUNTER — Other Ambulatory Visit: Payer: Self-pay | Admitting: *Deleted

## 2023-08-08 ENCOUNTER — Telehealth: Payer: Self-pay | Admitting: Internal Medicine

## 2023-08-08 DIAGNOSIS — R1319 Other dysphagia: Secondary | ICD-10-CM

## 2023-08-08 NOTE — Telephone Encounter (Signed)
Patients husband came in to schedule his wife for a procedure.  He said he thought it was to have her throat stretched but he wasn't sure.  If someone could please call her .Marland KitchenMarland KitchenMarland Kitchen

## 2023-08-08 NOTE — Telephone Encounter (Signed)
Pt says she has done the swallowing test with the speech therapist. Does she need to have an EGD? Please advise. Thank you

## 2023-08-08 NOTE — Addendum Note (Signed)
Addended by: Elinor Dodge on: 08/08/2023 12:25 PM   Modules accepted: Orders

## 2023-08-09 NOTE — ED Provider Notes (Signed)
[facility]    CSN: [id number] Arrival date & time: 08/05/23  0802      History   Chief Complaint Chief Complaint  Patient presents with   Shortness of Breath    HPI Kathleen [name] is a 62 y.o. female.   Patient presenting today with 2-day history of progressively worsening shortness of breath, wheezing, cough, congestion, chest tightness.  She has a significant past pulmonary history to include COPD, sarcoidosis, severe restrictive lung disease and is followed closely by pulmonology and on 4 L of continuous O2 via nasal cannula.  States she always feels fairly short of breath but the chest pain is new and central in location.  States her breathing treatments have not been helping since onset of symptoms 2 days ago.  Denies known fever, chills, dizziness, abdominal pain, nausea vomiting or diarrhea.  Denies known history of heart disease.    Past Medical History:  Diagnosis Date   Adrenal insufficiency (HCC)    Anemia    Anxiety    Avascular bone necrosis (HCC)    Breast fibrocystic disorder    Chronic pain    COPD (chronic obstructive pulmonary disease) (HCC)    Encounter for long-term opiate analgesic use 02/02/2020   Patient has chronic pain with femoral head avascular necrosis.  Is under pain contract   Fibromyalgia    Gastroesophageal reflux disease    Hypothyroidism    Mitral valve prolapse    Orthostatic hypotension    Peripheral neuropathy    PONV (postoperative nausea and vomiting)    Restrictive lung disease    Moderate to severe   Sarcoidosis    Biopsy proven - [facility]   SOB (shortness of breath)    chronic    Patient Active Problem List   Diagnosis Date Noted   COPD exacerbation (HCC) 04/07/2023   COPD with acute exacerbation (HCC) 04/07/2023   Ankle fracture, bimalleolar, closed, left, sequela 03/10/2023   Opioid dependence (HCC) 03/10/2023   Closed fracture of left ankle 03/10/2023   Syndesmotic disruption of ankle, left, initial encounter  03/10/2023   Lumbar transverse process fracture (HCC) 01/02/2023   Acute on chronic hypoxic respiratory failure (HCC) 12/13/2022   Depression, major, single episode, moderate (HCC) 09/10/2022   Aortic atherosclerosis (HCC) 09/10/2022   Chronic respiratory failure with hypoxia (HCC) 05/22/2021   Anxiety and depression 05/22/2021   Vitamin D deficiency 05/09/2021   Left foot drop 01/09/2021   Pulmonary HTN (HCC) 10/12/2020   Encounter for long-term opiate analgesic use 02/02/2020   Interstitial lung disease (HCC) 04/14/2019   GERD (gastroesophageal reflux disease) 10/15/2017   Personal history of noncompliance with medical treatment, presenting hazards to health 04/26/2017   Hypothyroidism 01/15/2017   Adrenal insufficiency (Addison's disease) (HCC) 08/22/2016   Symptomatic bradycardia    Hypokalemia 08/17/2016   Chronic pain syndrome 04/20/2016   Irritable bowel syndrome with constipation 11/14/2015   Avascular necrosis of bone of hip, left (HCC) 04/05/2015   Post herpetic neuralgia 10/22/2014   Mixed hyperlipidemia 08/25/2014   Obstructive sleep apnea 02/18/2014   Class 2 severe obesity due to excess calories with serious comorbidity and body mass index (BMI) of 38.0 to 38.9 in adult [facility]) 05/06/2013   Fibromyalgia 02/11/2013   Hypotension 01/16/2013   Fibrocystic breast disease in female 04/09/2012   Sarcoidosis 06/21/2008    Past Surgical History:  Procedure Laterality Date   BIOPSY  10/17/2018   Procedure: BIOPSY;  Surgeon: Malissa Hippo, MD;  [facility]: [facility];  Service: Endoscopy;;  duodenum, antrum, gastric body   BIOPSY  03/03/2021   Procedure: BIOPSY;  Surgeon: Malissa Hippo, MD;  [facility]: [facility];  Service: Endoscopy;;   BREAST LUMPECTOMY Bilateral 01/12/2011   CHOLECYSTECTOMY N/A 04/17/2021   Procedure: LAPAROSCOPIC CHOLECYSTECTOMY;  Surgeon: Franky Macho, MD;  Location: [facility];  Service: General;  Laterality: N/A;   COLONOSCOPY WITH PROPOFOL N/A  01/03/2022   Procedure: COLONOSCOPY WITH PROPOFOL;  Surgeon: Malissa Hippo, MD;  [facility];  Service: Endoscopy;  Laterality: N/A;  220   ESOPHAGEAL DILATION N/A 03/03/2021   Procedure: ESOPHAGEAL DILATION;  Surgeon: Malissa Hippo, MD;  [facility];  Service: Endoscopy;  Laterality: N/A;   ESOPHAGOGASTRODUODENOSCOPY (EGD) WITH PROPOFOL N/A 10/17/2018   Procedure: ESOPHAGOGASTRODUODENOSCOPY (EGD) WITH PROPOFOL;  Surgeon: Malissa Hippo, MD;  [facility];  Service: Endoscopy;  Laterality: N/A;  2:25   ESOPHAGOGASTRODUODENOSCOPY (EGD) WITH PROPOFOL N/A 03/03/2021   Procedure: ESOPHAGOGASTRODUODENOSCOPY (EGD) WITH PROPOFOL;  Surgeon: Malissa Hippo, MD;  [facility];  Service: Endoscopy;  Laterality: N/A;  12:15   ORIF ANKLE FRACTURE Left 03/10/2023   Procedure: OPEN REDUCTION INTERNAL FIXATION (ORIF) ANKLE FRACTURE;  Surgeon: [name], MD;  Location: [facility];  Service: Orthopedics;  Laterality: Left;   POLYPECTOMY  01/03/2022   Procedure: POLYPECTOMY INTESTINAL;  Surgeon: Malissa Hippo, MD;  [facility];  Service: Endoscopy;;   TUBAL LIGATION      OB History     Gravida  1   Para  1   Term  1   Preterm      AB      Living         SAB      IAB      Ectopic      Multiple      Live Births               Home Medications    Prior to Admission medications   Medication Sig Start Date End Date Taking? Authorizing Provider  albuterol (PROVENTIL) (2.5 MG/3ML) 0.083% nebulizer solution Take 3 mLs (2.5 mg total) by nebulization every 4 (four) hours as needed for wheezing or shortness of breath. 12/15/22 12/15/23  Shon Hale, MD  albuterol (VENTOLIN HFA) 108 (90 Base) MCG/ACT inhaler Inhale 2 puffs into the lungs every 6 (six) hours as needed for wheezing or shortness of breath. 12/15/22   Shon Hale, MD  budesonide (PULMICORT) 0.5 MG/2ML nebulizer solution Inhale the contents of 1 vial via  nebulizer two (2) times a day. 11/22/21     busPIRone (BUSPAR) 15 MG tablet Take 1 tablet (15 mg) by mouth 3 times daily. 05/06/23   [name], MD  calcitonin, salmon, (MIACALCIN) 200 UNIT/ACT nasal spray Use 1 spray in one nostril per day, alternate nostrils every other day (may have 1 vial she should use this for at least 2 weeks but may use as long as 4 weeks) 03/01/23   Gerda Diss, Jonna Coup, MD  doxycycline (VIBRA-TABS) 100 MG tablet Take 1 tablet (100 mg total) by mouth 2 (two) times daily. 07/18/23   [name], MD  DULoxetine (CYMBALTA) 60 MG capsule Take 1 capsule (60 mg) by mouth daily. 05/06/23   [name], MD  EPINEPHrine 0.3 mg/0.3 mL IJ SOAJ injection Inject 0.3 mg into the muscle as needed for anaphylaxis. 01/05/22   [name], MD  ferrous sulfate 325 (65 FE) MG  EC tablet Take 325 mg by mouth once a week.    [provider]  furosemide (LASIX) 20 MG tablet Take 1 tablet (20 mg total) by mouth every Monday, Wednesday, and Friday. 07/17/23   [name], MD  hydrocortisone (CORTEF) 10 MG tablet Take 2 tablets by mouth at 8:00 am and 1 tablet at lunch. (May double dose on sick days x 3-5 days) 07/11/23   Dani Gobble, NP  hydrOXYzine (ATARAX) 25 MG tablet Take 1 tablet (25 mg total) by mouth 3 (three) times daily as needed for anxiety. 06/25/23 06/24/24  [name], MD  ipratropium-albuterol (DUONEB) 0.5-2.5 (3) MG/3ML SOLN Take 3 mLs by nebulization every 8 (eight) hours. 12/17/22     levothyroxine (SYNTHROID) 50 MCG tablet Take 1 tablet by mouth daily before breakfast. 06/10/23 12/07/23  Roma Kayser, MD  lidocaine (LIDODERM) 5 % Place 1 patch onto the skin daily. Remove & Discard patch within 12 hours or as directed by MD 04/09/23   Sherryll Burger, Pratik D, DO  lidocaine (LIDODERM) 5 % Place 1 patch onto the skin daily. Remove and discard within 12 hours as directed by MD 04/02/23   Kommor, Wyn Forster, MD  linaclotide [facility]) 290 MCG CAPS capsule Take 1 capsule  (290 mcg total) by mouth daily before breakfast. 03/28/22   [name], MD  meclizine (ANTIVERT) 25 MG tablet Take 1 tablet (25 mg total) by mouth 2 (two) times daily as needed for dizziness. Patient not taking: Reported on 08/05/2023 06/18/23   [name], MD  methylPREDNISolone sodium succinate (SOLU-MEDROL, PF,) 125 MG injection ACT-O-VIAL Administer as directed.  Giving IV is preferred, but can also be given IM if IV access is difficult. 07/24/23   Roma Kayser, MD  midodrine (PROAMATINE) 5 MG tablet Take 1 tablet (5 mg total) by mouth 2 (two) times daily with a meal. 06/19/23 06/18/24  [name], MD  naloxone [facility]) nasal spray 4 mg/0.1 mL Use as directed for opioid overdose 12/26/22   [name], MD  ondansetron (ZOFRAN-ODT) 8 MG disintegrating tablet Take 8 mg by mouth every 8 (eight) hours as needed for nausea or vomiting.    [provider]  ondansetron (ZOFRAN-ODT) 8 MG disintegrating tablet Dissolve 1 tablet (8 mg total) by mouth every 8 (eight) hours as needed. 04/23/23 04/22/24  [name], MD  oxyCODONE-acetaminophen (PERCOCET) 10-325 MG tablet Take 1 tablet by mouth every 4 (four) hours as needed for pain. Max 6 /day 07/16/23   [name], MD  oxyCODONE-acetaminophen (PERCOCET) 10-325 MG tablet Take 1 tablet by mouth every 4 (four) hours as needed for pain. Max 6 per day 09/11/23   [name], MD  oxyCODONE-acetaminophen (PERCOCET) 10-325 MG tablet Take 1 tablet by mouth every 4 (four) hours as needed for pain. Max 6/day 08/12/23   [name], MD  pantoprazole (PROTONIX) 40 MG tablet Take 1 tablet (40 mg total) by mouth 2 (two) times daily before a meal. 12/15/22 12/15/23  Emokpae, Courage, MD  predniSONE (DELTASONE) 5 MG tablet Take 1 tablet (5 mg total) by mouth in the morning. 08/01/23     prochlorperazine (COMPAZINE) 25 MG suppository Place 1 suppository (25 mg total) rectally every 12 (twelve) hours as needed for nausea or vomiting.  06/02/23   Cardama, Amadeo Garnet, MD  prochlorperazine (COMPAZINE) 5 MG tablet Take 1 tablet (5 mg total) by mouth 2 (two) times daily as needed for nausea or vomiting. 07/16/23  [name], MD  rosuvastatin (CRESTOR) 10 MG tablet Take 1 tablet (10 mg total) by mouth daily. 07/16/23   [name], MD  senna-docusate (SENOKOT-S) 8.6-50 MG tablet Take 1 tablet by mouth 2 (two) times daily between meals as needed for mild constipation. 03/13/23   Almon Hercules, MD    Family History Family History  Problem Relation Age of Onset   Cardiomyopathy Mother    Breast cancer Mother    Prostate cancer Father    Heart attack Maternal Grandmother    Heart attack Maternal Grandfather    Heart attack Paternal Grandmother    Bipolar disorder Daughter    Heart disease Maternal Aunt    Neurofibromatosis Cousin    Colon cancer Neg Hx     Social History Social History   Tobacco Use   Smoking status: Never    Passive exposure: Never   Smokeless tobacco: Never  Vaping Use   Vaping status: Never Used  Substance Use Topics   Alcohol use: No   Drug use: No     Allergies   Bee venom, Diphenhydramine, Contrast media [iodinated contrast media], Bactrim [sulfamethoxazole-trimethoprim], Codeine, and Morphine and codeine   Review of Systems Review of Systems Per HPI  Physical Exam Triage Vital Signs ED Triage Vitals [08/05/23 0827]  Encounter Vitals Group     BP 123/73     Systolic BP Percentile      Diastolic BP Percentile      Pulse Rate 88     Resp (!) 28     Temp      Temp src      SpO2 96 %     Weight      Height      Head Circumference      Peak Flow      Pain Score      Pain Loc      Pain Education      Exclude from Growth Chart    No data found.  Updated Vital Signs BP 123/73 (BP Location: Right Arm)   Pulse 88   Resp (!) 28   LMP 05/17/2016   SpO2 96%   Visual Acuity Right Eye Distance:   Left Eye Distance:   Bilateral Distance:    Right Eye Near:    Left Eye Near:    Bilateral Near:     Physical Exam Vitals and nursing note reviewed.  Constitutional:      Appearance: Normal appearance. She is not ill-appearing.  HENT:     Head: Atraumatic.     Mouth/Throat:     Mouth: Mucous membranes are moist.  Eyes:     Extraocular Movements: Extraocular movements intact.     Conjunctiva/sclera: Conjunctivae normal.  Cardiovascular:     Rate and Rhythm: Normal rate and regular rhythm.  Pulmonary:     Comments: On 4 L via nasal cannula, tachypneic, increased effort of breathing and speech Musculoskeletal:        General: Normal range of motion.     Cervical back: Normal range of motion and neck supple.  Skin:    General: Skin is warm and dry.  Neurological:     Mental Status: She is alert and oriented to person, place, and time.  Psychiatric:        Mood and Affect: Mood normal.        Thought Content: Thought content normal.        Judgment: Judgment normal.  UC Treatments / Results  Labs (all labs ordered are listed, but only abnormal results are displayed) Labs Reviewed - No data to display  EKG   Radiology No results found.  Procedures Procedures (including critical care time)  Medications Ordered in UC Medications - No data to display  Initial Impression / Assessment and Plan / UC Course  I have reviewed the triage vital signs and the nursing notes.  Pertinent labs & imaging results that were available during my care of the patient were reviewed by me and considered in my medical decision making (see chart for details).     Discussed with patient given her new onset chest pain and worsening shortness of breath not relieved with her home breathing treatments that she should go to the emergency department for further evaluation.  She is agreeable and wishes to go via private vehicle with her friend member who is with her today.  She declines EMS transport.  She is hemodynamically stable for private vehicle  transport at this time. Final Clinical Impressions(s) / UC Diagnoses   Final diagnoses:  Chest pain, unspecified type  SOB (shortness of breath)  COPD exacerbation (HCC)  Restrictive lung disease   Discharge Instructions   None    ED Prescriptions   None    PDMP not reviewed this encounter.   Particia Nearing, [place] 08/09/23 1212

## 2023-08-09 NOTE — Telephone Encounter (Signed)
Pt informed of providers message. She states she feels like she needs her esophagus stretched because feels like things are getting caught in throat.

## 2023-08-09 NOTE — Telephone Encounter (Signed)
What ASA would she be?

## 2023-08-12 ENCOUNTER — Ambulatory Visit: Payer: HMO | Admitting: Family Medicine

## 2023-08-12 ENCOUNTER — Encounter: Payer: Self-pay | Admitting: *Deleted

## 2023-08-12 ENCOUNTER — Other Ambulatory Visit (HOSPITAL_COMMUNITY): Payer: Self-pay

## 2023-08-12 ENCOUNTER — Other Ambulatory Visit: Payer: Self-pay

## 2023-08-12 VITALS — BP 137/80 | HR 81 | Temp 97.3°F | Ht 70.0 in

## 2023-08-12 DIAGNOSIS — I700 Atherosclerosis of aorta: Secondary | ICD-10-CM

## 2023-08-12 DIAGNOSIS — J849 Interstitial pulmonary disease, unspecified: Secondary | ICD-10-CM | POA: Diagnosis not present

## 2023-08-12 DIAGNOSIS — M87052 Idiopathic aseptic necrosis of left femur: Secondary | ICD-10-CM | POA: Diagnosis not present

## 2023-08-12 MED ORDER — ROSUVASTATIN CALCIUM 20 MG PO TABS: 20.0000 mg | ORAL_TABLET | Freq: Every day | ORAL | 1 refills | Status: DC

## 2023-08-12 NOTE — Progress Notes (Signed)
Subjective:    Patient ID: Kathleen [name], female    DOB: July 29, 1961, 62 y.o.   MRN: [id number]  Discussed the use of AI scribe software for clinical note transcription with the patient, who gave verbal consent to proceed.  History of Present Illness   The patient, with a history of interstitial lung disease, presents with ongoing chest pain on the right side, which led to a recent emergency department visit. She denies having COPD, despite it being listed in her medical chart, and attributes her lung issues to scarring from interstitial lung disease. She also reports an elevated thyroid level from recent blood work.  The patient has been experiencing elevated D-dimers for years, but denies having blood clots. She also reports abdominal discomfort, which she suspects might be due to a hernia, although this has not been confirmed. A recent scan of the abdomen showed no bowel obstruction or inflammatory changes, but did reveal aortic atherosclerosis.  The patient has been experiencing pain in her right foot and hip, which she describes as constant. She also reports numbness in her left hand and foot, which has been ongoing for a couple of months. The patient is wheelchair-bound and struggles with mobility.  The patient has been experiencing panic attacks and low mood, which she attributes to her inability to breathe well, constant pain, and limited mobility. She reports trying to find enjoyment in small things, such as watching shows and working on puzzles, but finds it difficult due to her health issues.  The patient is on cholesterol medication, which she has been advised to continue taking. She has also been prescribed pain medication, which she is due to pick up. The patient has expressed interest in weight loss medication, but these are currently not covered by her insurance.  The patient lives with her family and is able to get around their house with some difficulty. She reports that her  family is supportive, but she struggles with feelings of sadness and worry about her future health.         Review of Systems     Objective:    Physical Exam   VITALS: SaO2 in the 80s CARDIOVASCULAR: Normal heart sounds on auscultation EXTREMITIES: Mild edema in legs     General-in no acute distress Eyes-no discharge Lungs-respiratory rate normal, CTA CV-no murmurs,RRR Extremities skin warm dry mild lower leg edema bilateral-this is felt due to the fact that she is essentially wheelchair-bound Neuro grossly normal Behavior normal, alert       Assessment & Plan:  Assessment and Plan    Interstitial Lung Disease Patient reports chest pain and was evaluated in the emergency department. CT scan confirmed interstitial lung disease, not COPD as previously documented in the chart. -No changes to current management plan.  Hyperlipidemia LDL slightly above goal (81, goal <70). Patient is currently on Atorvastatin 10mg  daily. -Increase Atorvastatin to 20mg  daily. New prescription to be sent to [facility].  Chronic Pain Patient reports constant pain, particularly in the hip. Currently on Oxycodone. -Continue current pain management. Encouraged patient to try stretches and warm compresses for relief.  Anxiety/Depression Patient reports frequent panic attacks and low mood. Currently on an antidepressant and Buspar. -Continue current medications. Encouraged patient to find small daily joys and distractions such as watching shows and working on puzzles.  General Health Maintenance -Schedule follow-up appointment for late January to early February 2025 to monitor interstitial lung disease.     1. Aortic atherosclerosis (HCC) (Primary) We will bump up  the dose of her statin the goal is to get LDL below 70  2. Interstitial lung disease (HCC) She is disabled by this.  On constant oxygen.  At high risk of developing infections including pneumonia She will let us know if she is  unfortunate enough to come down with any type of sickness otherwise we will follow her up every 3 months at the same time as her pain management  3. Avascular necrosis of bone of hip, left (HCC) Patient with avascular necrosis of the hip unable to get any type of surgery coming up because of her underlying prednisone dependency  Sharp pain she is having on the right side of her chest intermittent causes her to go to the ER but should be noted that workup did not show any signs of blood clots -No further testing necessary at this time  Follow-up for pain management visit late January early February

## 2023-08-12 NOTE — Telephone Encounter (Signed)
Pt has been scheduled for 09/11/23. Instructions mailed.

## 2023-08-13 ENCOUNTER — Other Ambulatory Visit (HOSPITAL_COMMUNITY): Payer: Self-pay

## 2023-08-13 ENCOUNTER — Other Ambulatory Visit: Payer: Self-pay | Admitting: Family Medicine

## 2023-08-13 MED ORDER — PANTOPRAZOLE SODIUM 40 MG PO TBEC
40.0000 mg | DELAYED_RELEASE_TABLET | Freq: Two times a day (BID) | ORAL | 5 refills | Status: DC
  Filled 2023-08-13 – 2023-08-19 (×2): qty 60, 30d supply, fill #0
  Filled 2023-09-12 – 2023-10-11 (×2): qty 60, 30d supply, fill #1
  Filled 2023-11-06: qty 60, 30d supply, fill #2
  Filled 2023-12-30: qty 60, 30d supply, fill #3

## 2023-08-13 MED ORDER — DOXYCYCLINE HYCLATE 100 MG PO TABS
100.0000 mg | ORAL_TABLET | Freq: Two times a day (BID) | ORAL | 0 refills | Status: DC
  Filled 2023-08-13: qty 20, 10d supply, fill #0

## 2023-08-13 MED ORDER — HYDROXYZINE HCL 25 MG PO TABS
25.0000 mg | ORAL_TABLET | Freq: Three times a day (TID) | ORAL | 1 refills | Status: DC | PRN
  Filled 2023-08-13 – 2023-08-31 (×2): qty 90, 30d supply, fill #0
  Filled 2023-09-30: qty 90, 30d supply, fill #1

## 2023-08-14 ENCOUNTER — Other Ambulatory Visit (HOSPITAL_COMMUNITY): Payer: Self-pay

## 2023-08-14 ENCOUNTER — Ambulatory Visit: Payer: HMO | Admitting: Family Medicine

## 2023-08-14 DIAGNOSIS — D860 Sarcoidosis of lung: Secondary | ICD-10-CM | POA: Diagnosis not present

## 2023-08-14 DIAGNOSIS — R0602 Shortness of breath: Secondary | ICD-10-CM | POA: Diagnosis not present

## 2023-08-14 DIAGNOSIS — J849 Interstitial pulmonary disease, unspecified: Secondary | ICD-10-CM | POA: Diagnosis not present

## 2023-08-15 ENCOUNTER — Other Ambulatory Visit (HOSPITAL_COMMUNITY): Payer: Self-pay

## 2023-08-15 DIAGNOSIS — J849 Interstitial pulmonary disease, unspecified: Secondary | ICD-10-CM | POA: Diagnosis not present

## 2023-08-15 DIAGNOSIS — R0600 Dyspnea, unspecified: Secondary | ICD-10-CM | POA: Diagnosis not present

## 2023-08-17 DIAGNOSIS — R0602 Shortness of breath: Secondary | ICD-10-CM | POA: Diagnosis not present

## 2023-08-17 DIAGNOSIS — D860 Sarcoidosis of lung: Secondary | ICD-10-CM | POA: Diagnosis not present

## 2023-08-17 DIAGNOSIS — J849 Interstitial pulmonary disease, unspecified: Secondary | ICD-10-CM | POA: Diagnosis not present

## 2023-08-19 ENCOUNTER — Other Ambulatory Visit (HOSPITAL_COMMUNITY): Payer: Self-pay

## 2023-08-30 DIAGNOSIS — H2513 Age-related nuclear cataract, bilateral: Secondary | ICD-10-CM | POA: Diagnosis not present

## 2023-08-30 DIAGNOSIS — H524 Presbyopia: Secondary | ICD-10-CM | POA: Diagnosis not present

## 2023-08-31 ENCOUNTER — Other Ambulatory Visit (HOSPITAL_COMMUNITY): Payer: Self-pay

## 2023-08-31 ENCOUNTER — Other Ambulatory Visit: Payer: Self-pay | Admitting: Family Medicine

## 2023-09-02 ENCOUNTER — Other Ambulatory Visit (HOSPITAL_COMMUNITY): Payer: Self-pay

## 2023-09-02 ENCOUNTER — Other Ambulatory Visit (HOSPITAL_BASED_OUTPATIENT_CLINIC_OR_DEPARTMENT_OTHER): Payer: Self-pay

## 2023-09-02 MED ORDER — DULOXETINE HCL 60 MG PO CPEP
60.0000 mg | ORAL_CAPSULE | Freq: Every day | ORAL | 1 refills | Status: DC
  Filled 2023-09-02: qty 90, 90d supply, fill #0
  Filled 2023-11-09: qty 90, 90d supply, fill #1

## 2023-09-03 ENCOUNTER — Other Ambulatory Visit (HOSPITAL_COMMUNITY): Payer: Self-pay

## 2023-09-03 ENCOUNTER — Other Ambulatory Visit: Payer: Self-pay | Admitting: "Endocrinology

## 2023-09-04 ENCOUNTER — Other Ambulatory Visit: Payer: Self-pay

## 2023-09-04 ENCOUNTER — Encounter (HOSPITAL_COMMUNITY)
Admission: RE | Admit: 2023-09-04 | Discharge: 2023-09-04 | Disposition: A | Discharge status: Home / Self Care | Payer: HMO | Source: Ambulatory Visit | Attending: Internal Medicine | Admitting: Internal Medicine

## 2023-09-04 ENCOUNTER — Encounter (HOSPITAL_COMMUNITY): Payer: Self-pay

## 2023-09-05 ENCOUNTER — Other Ambulatory Visit (HOSPITAL_COMMUNITY): Payer: Self-pay

## 2023-09-05 MED ORDER — SOLU-MEDROL (PF) 125 MG IJ SOLR
INTRAMUSCULAR | 1 refills | Status: DC
  Filled 2023-09-05: qty 1, 1d supply, fill #0
  Filled 2023-09-12 – 2023-09-28 (×2): qty 1, 1d supply, fill #1

## 2023-09-06 ENCOUNTER — Other Ambulatory Visit (HOSPITAL_COMMUNITY): Payer: Self-pay

## 2023-09-09 ENCOUNTER — Other Ambulatory Visit (HOSPITAL_COMMUNITY): Payer: Self-pay

## 2023-09-10 ENCOUNTER — Other Ambulatory Visit (HOSPITAL_COMMUNITY): Payer: Self-pay

## 2023-09-11 ENCOUNTER — Ambulatory Visit (HOSPITAL_COMMUNITY)
Admission: RE | Admit: 2023-09-11 | Discharge: 2023-09-11 | Disposition: A | Discharge status: Home / Self Care | Payer: HMO | Attending: Internal Medicine | Admitting: Internal Medicine

## 2023-09-11 ENCOUNTER — Encounter (HOSPITAL_COMMUNITY)
Admission: RE | Disposition: A | Discharge status: Home / Self Care | Payer: Self-pay | Source: Home / Self Care | Attending: Internal Medicine

## 2023-09-11 ENCOUNTER — Ambulatory Visit (HOSPITAL_COMMUNITY): Payer: HMO | Admitting: Certified Registered"

## 2023-09-11 ENCOUNTER — Encounter (HOSPITAL_COMMUNITY): Payer: Self-pay | Admitting: Internal Medicine

## 2023-09-11 DIAGNOSIS — F419 Anxiety disorder, unspecified: Secondary | ICD-10-CM | POA: Diagnosis not present

## 2023-09-11 DIAGNOSIS — E66813 Obesity, class 3: Secondary | ICD-10-CM | POA: Diagnosis not present

## 2023-09-11 DIAGNOSIS — R1310 Dysphagia, unspecified: Secondary | ICD-10-CM

## 2023-09-11 DIAGNOSIS — Z6834 Body mass index (BMI) 34.0-34.9, adult: Secondary | ICD-10-CM | POA: Diagnosis not present

## 2023-09-11 DIAGNOSIS — F32A Depression, unspecified: Secondary | ICD-10-CM | POA: Diagnosis not present

## 2023-09-11 DIAGNOSIS — J449 Chronic obstructive pulmonary disease, unspecified: Secondary | ICD-10-CM

## 2023-09-11 DIAGNOSIS — E039 Hypothyroidism, unspecified: Secondary | ICD-10-CM

## 2023-09-11 DIAGNOSIS — K219 Gastro-esophageal reflux disease without esophagitis: Secondary | ICD-10-CM | POA: Insufficient documentation

## 2023-09-11 DIAGNOSIS — Z9981 Dependence on supplemental oxygen: Secondary | ICD-10-CM | POA: Diagnosis not present

## 2023-09-11 DIAGNOSIS — E119 Type 2 diabetes mellitus without complications: Secondary | ICD-10-CM

## 2023-09-11 DIAGNOSIS — E2740 Unspecified adrenocortical insufficiency: Secondary | ICD-10-CM | POA: Diagnosis not present

## 2023-09-11 DIAGNOSIS — K3189 Other diseases of stomach and duodenum: Secondary | ICD-10-CM | POA: Diagnosis not present

## 2023-09-11 HISTORY — PX: MALONEY DILATION: SHX5535

## 2023-09-11 HISTORY — PX: BIOPSY: SHX5522

## 2023-09-11 HISTORY — PX: ESOPHAGOGASTRODUODENOSCOPY (EGD) WITH PROPOFOL: SHX5813

## 2023-09-11 SURGERY — ESOPHAGOGASTRODUODENOSCOPY (EGD) WITH PROPOFOL
Anesthesia: General

## 2023-09-11 MED ORDER — ONDANSETRON HCL 4 MG/2ML IJ SOLN
INTRAMUSCULAR | Status: DC | PRN
  Administered 2023-09-11: 4 mg via INTRAVENOUS

## 2023-09-11 MED ORDER — PROPOFOL 10 MG/ML IV BOLUS
INTRAVENOUS | Status: DC | PRN
  Administered 2023-09-11: 40 mg via INTRAVENOUS
  Administered 2023-09-11: 30 mg via INTRAVENOUS
  Administered 2023-09-11: 80 mg via INTRAVENOUS
  Administered 2023-09-11: 20 mg via INTRAVENOUS

## 2023-09-11 MED ORDER — SODIUM CHLORIDE 0.9% FLUSH: 3.0000 mL | Freq: Two times a day (BID) | INTRAVENOUS | Status: DC

## 2023-09-11 MED ORDER — HYDROCORTISONE SOD SUC (PF) 100 MG IJ SOLR
INTRAMUSCULAR | Status: DC | PRN
  Administered 2023-09-11: 100 mg via INTRAVENOUS

## 2023-09-11 MED ORDER — MIDAZOLAM HCL 2 MG/2ML IJ SOLN: 2.0000 mg | Freq: Once | INTRAMUSCULAR | Status: DC

## 2023-09-11 MED ORDER — LACTATED RINGERS IV SOLN: INTRAVENOUS | Status: DC | PRN

## 2023-09-11 MED ORDER — MIDAZOLAM HCL 2 MG/2ML IJ SOLN
INTRAMUSCULAR | Status: AC
  Filled 2023-09-11: qty 2

## 2023-09-11 MED ORDER — ONDANSETRON HCL 4 MG/2ML IJ SOLN
INTRAMUSCULAR | Status: AC
  Filled 2023-09-11: qty 2

## 2023-09-11 MED ORDER — LIDOCAINE HCL (CARDIAC) PF 100 MG/5ML IV SOSY
PREFILLED_SYRINGE | INTRAVENOUS | Status: DC | PRN
  Administered 2023-09-11: 100 mg via INTRAVENOUS

## 2023-09-11 MED ORDER — MIDAZOLAM HCL 2 MG/2ML IJ SOLN
INTRAMUSCULAR | Status: DC | PRN
  Administered 2023-09-11: 2 mg via INTRAVENOUS

## 2023-09-11 MED ORDER — SODIUM CHLORIDE 0.9% FLUSH: 3.0000 mL | INTRAVENOUS | Status: DC | PRN

## 2023-09-11 MED ORDER — HYDROCORTISONE SOD SUC (PF) 100 MG IJ SOLR
INTRAMUSCULAR | Status: AC
  Filled 2023-09-11: qty 2

## 2023-09-11 MED ORDER — PHENYLEPHRINE 80 MCG/ML (10ML) SYRINGE FOR IV PUSH (FOR BLOOD PRESSURE SUPPORT)
PREFILLED_SYRINGE | INTRAVENOUS | Status: DC | PRN
  Administered 2023-09-11: 160 ug via INTRAVENOUS

## 2023-09-11 MED ORDER — PROPOFOL 500 MG/50ML IV EMUL
INTRAVENOUS | Status: DC | PRN
  Administered 2023-09-11: 125 ug/kg/min via INTRAVENOUS

## 2023-09-11 NOTE — Op Note (Signed)
 [facility] Patient Name: Kathleen [name] Procedure Date: 09/11/2023 11:55 AM MRN: [id number] Date of Birth: April 08, 1961 Attending MD: Gemma Kelp , MD, [id number] CSN: [id number] Age: 63 Admit Type: Outpatient Procedure:                Upper GI endoscopy Indications:              Dysphagia Providers:                Gemma Kelp, MD, Karyle Pagoda, RN,                            Quay Bruin Referring MD:              Medicines:                Propofol  per Anesthesia Complications:            No immediate complications. Estimated Blood Loss:     Estimated blood loss was minimal. Procedure:                Pre-Anesthesia Assessment:                           - Prior to the procedure, a History and Physical                            was performed, and patient medications and                            allergies were reviewed. The patient's tolerance of                            previous anesthesia was also reviewed. The risks                            and benefits of the procedure and the sedation                            options and risks were discussed with the patient.                            All questions were answered, and informed consent                            was obtained. Prior Anticoagulants: The patient has                            taken no anticoagulant or antiplatelet agents. ASA                            Grade Assessment: III - A patient with severe                            systemic disease. After reviewing the risks and  benefits, the patient was deemed in satisfactory                            condition to undergo the procedure.                           After obtaining informed consent, the endoscope was                            passed under direct vision. Throughout the                            procedure, the patient's blood pressure, pulse, and                            oxygen  saturations were  monitored continuously. The                            GIF-H190 ([id number]) scope was introduced through the                            mouth, and advanced to the second part of duodenum.                            The upper GI endoscopy was accomplished without                            difficulty. The patient tolerated the procedure                            well. Scope In: 12:13:30 PM Scope Out: 12:21:13 PM Total Procedure Duration: 0 hours 7 minutes 43 seconds  Findings:      The examined esophagus was normal. Gastric cavity empty. Minimal touch       friability diffusely. Submucosal petechial hemorrhages in the antrum.       Patchy erythema - no ulcer or infiltrating process seen. Pylorus patent       easily traversed      The duodenal bulb and second portion of the duodenum were normal. The       scope was withdrawn. Dilation was performed with a Maloney dilator with       mild resistance at 54 Fr. The scope was withdrawn. Dilation was       performed with a Maloney dilator with no resistance at 56 Fr. The       dilation site was examined following endoscope reinsertion and showed no       change. Estimated blood loss was minimal. Finally, biopsies of the       gastric antrum and body were taken for histologic study Impression:               - Normal esophagus. Dilated. Probable gastric                            mucosa with submucosal petechiae. Status post  biopsy after dilation                           - Normal duodenal bulb and second portion of the                            duodenum. Moderate Sedation:      Moderate (conscious) sedation was personally administered by an       anesthesia professional. The following parameters were monitored: oxygen        saturation, heart rate, blood pressure, respiratory rate, EKG, adequacy       of pulmonary ventilation, and response to care. Recommendation:           - Patient has a contact number available for                             emergencies. The signs and symptoms of potential                            delayed complications were discussed with the                            patient. Return to normal activities tomorrow.                            Written discharge instructions were provided to the                            patient.                           - Advance diet as tolerated. Follow-up on                            pathology. Office visit with us  in 6 to 8 weeks. Procedure Code(s):        --- Professional ---                           [phone], Esophagogastroduodenoscopy, flexible,                            transoral; diagnostic, including collection of                            specimen(s) by brushing or washing, when performed                            (separate procedure)                           43450, Dilation of esophagus, by unguided sound or                            bougie, single or multiple passes Diagnosis Code(s):        --- Professional ---  R13.10, Dysphagia, unspecified CPT copyright 2022 American Medical Association. All rights reserved. The codes documented in this report are preliminary and upon coder review may  be revised to meet current compliance requirements. Windsor Hatcher. Duval Macleod, MD Gemma Kelp, MD 09/11/2023 12:29:49 PM This report has been signed electronically. Number of Addenda: 0

## 2023-09-11 NOTE — Transfer of Care (Signed)
 Immediate Anesthesia Transfer of Care Note  Patient: Kathleen [name]  Procedure(s) Performed: ESOPHAGOGASTRODUODENOSCOPY (EGD) WITH PROPOFOL  MALONEY DILATION BIOPSY  Patient Location: Short Stay  Anesthesia Type:General  Level of Consciousness: awake and patient cooperative  Airway & Oxygen  Therapy: Patient Spontanous Breathing and Patient connected to nasal cannula oxygen   Post-op Assessment: Report given to RN and Post -op Vital signs reviewed and stable  Post vital signs: Reviewed and stable  Last Vitals:  Vitals Value Taken Time  BP 103/48 09/11/23 1230  Temp 37 C 09/11/23 1228  Pulse 93 09/11/23 1232  Resp 20 09/11/23 1231  SpO2 98 % 09/11/23 1232  Vitals shown include unfiled device data.  Last Pain:  Vitals:   09/11/23 1228  TempSrc: Axillary  PainSc: 10-Worst pain ever         Complications: No notable events documented.

## 2023-09-11 NOTE — H&P (Signed)
 @LOGO @   Primary Care Physician:  Bennet Brasil, MD Primary Gastroenterologist:  Dr. Riley Cheadle  Pre-Procedure History & Physical: HPI:  Kathleen [name] is a 63 y.o. female here for Further evaluation of esophageal dysphagia as both an oropharyngeal and esophageal component speech evaluation demonstrated no overt abnormalities although she has been noted to have slight amount of aspiration only inconclusive barium study and on swallowing evaluation.  Cannot recall if [facility] dilation 3 years ago helped or not.  Tubular esophagus could not be adequately evaluated on barium study due to patient's lack of cooperation.  Wasting with a EGD and esophageal dilation is feasible/appropriate per plan.  Past Medical History:  Diagnosis Date   Adrenal insufficiency (HCC)    Anemia    Anxiety    Avascular bone necrosis (HCC)    Breast fibrocystic disorder    Chronic pain    Encounter for long-term opiate analgesic use 02/02/2020   Patient has chronic pain with femoral head avascular necrosis.  Is under pain contract   Fibromyalgia    Gastroesophageal reflux disease    Hypothyroidism    Mitral valve prolapse    Orthostatic hypotension    Peripheral neuropathy    PONV (postoperative nausea and vomiting)    Restrictive lung disease    Moderate to severe   Sarcoidosis    Biopsy proven - [facility]   Sarcoidosis    SOB (shortness of breath)    chronic    Past Surgical History:  Procedure Laterality Date   BIOPSY  10/17/2018   Procedure: BIOPSY;  Surgeon: Ruby Corporal, MD;  Location: [facility];  Service: Endoscopy;;  duodenum, antrum, gastric body   BIOPSY  03/03/2021   Procedure: BIOPSY;  Surgeon: Ruby Corporal, MD;  Location: [facility];  Service: Endoscopy;;   BREAST LUMPECTOMY Bilateral 01/12/2011   CHOLECYSTECTOMY N/A 04/17/2021   Procedure: LAPAROSCOPIC CHOLECYSTECTOMY;  Surgeon: Alanda Allegra, MD;  Location: [facility];  Service: General;  Laterality: N/A;   COLONOSCOPY WITH PROPOFOL   N/A 01/03/2022   Procedure: COLONOSCOPY WITH PROPOFOL ;  Surgeon: Ruby Corporal, MD;  Location: [facility];  Service: Endoscopy;  Laterality: N/A;  220   ESOPHAGEAL DILATION N/A 03/03/2021   Procedure: ESOPHAGEAL DILATION;  Surgeon: Ruby Corporal, MD;  [facility]: [facility];  Service: Endoscopy;  Laterality: N/A;   ESOPHAGOGASTRODUODENOSCOPY (EGD) WITH PROPOFOL  N/A 10/17/2018   Procedure: ESOPHAGOGASTRODUODENOSCOPY (EGD) WITH PROPOFOL ;  Surgeon: Ruby Corporal, MD;  [facility];  Service: Endoscopy;  Laterality: N/A;  2:25   ESOPHAGOGASTRODUODENOSCOPY (EGD) WITH PROPOFOL  N/A 03/03/2021   Procedure: ESOPHAGOGASTRODUODENOSCOPY (EGD) WITH PROPOFOL ;  Surgeon: Ruby Corporal, MD;  [facility];  Service: Endoscopy;  Laterality: N/A;  12:15   ORIF ANKLE FRACTURE Left 03/10/2023   Procedure: OPEN REDUCTION INTERNAL FIXATION (ORIF) ANKLE FRACTURE;  Surgeon: Diedra Fowler, MD;  Location: [facility];  Service: Orthopedics;  Laterality: Left;   POLYPECTOMY  01/03/2022   Procedure: POLYPECTOMY INTESTINAL;  Surgeon: Ruby Corporal, MD;  Location: [facility];  Service: Endoscopy;;   TUBAL LIGATION      Prior to Admission medications   Medication Sig Start Date End Date Taking? Authorizing Provider  albuterol  (PROVENTIL ) (2.5 MG/3ML) 0.083% nebulizer solution Take 3 mLs (2.5 mg total) by nebulization every 4 (four) hours as needed for wheezing or shortness of breath. 12/15/22 12/15/23  Colin Dawley, MD  albuterol  (VENTOLIN  HFA) 108 (90 Base) MCG/ACT inhaler Inhale 2 puffs into the lungs every  6 (six) hours as needed for wheezing or shortness of breath. 12/15/22   Colin Dawley, MD  budesonide  (PULMICORT ) 0.5 MG/2ML nebulizer solution Inhale the contents of 1 vial via nebulizer two (2) times a day. 11/22/21     busPIRone  (BUSPAR ) 15 MG tablet Take 1 tablet (15 mg) by mouth 3 times daily. 05/06/23   Bennet Brasil, MD  calcitonin, salmon, (MIACALCIN ) 200 UNIT/ACT nasal  spray Use 1 spray in one nostril per day, alternate nostrils every other day (may have 1 vial she should use this for at least 2 weeks but may use as long as 4 weeks) 03/01/23   Fairy Homer, Jackelyn Marvel, MD  doxycycline  (VIBRA -TABS) 100 MG tablet Take 1 tablet (100 mg total) by mouth 2 (two) times daily. 08/13/23   Bennet Brasil, MD  DULoxetine  (CYMBALTA ) 60 MG capsule Take 1 capsule (60 mg total) by mouth daily. 09/02/23   Bennet Brasil, MD  EPINEPHrine  0.3 mg/0.3 mL IJ SOAJ injection Inject 0.3 mg into the muscle as needed for anaphylaxis. 01/05/22   Bennet Brasil, MD  ferrous sulfate  325 (65 FE) MG EC tablet Take 325 mg by mouth once a week.    [provider]  furosemide  (LASIX ) 20 MG tablet Take 1 tablet (20 mg total) by mouth every Monday, Wednesday, and Friday. 07/17/23   Bennet Brasil, MD  hydrocortisone  (CORTEF ) 10 MG tablet Take 2 tablets by mouth at 8:00 am and 1 tablet at lunch. (May double dose on sick days x 3-5 days) 07/11/23   Wendel Hals, NP  hydrOXYzine  (ATARAX ) 25 MG tablet Take 1 tablet (25 mg total) by mouth 3 (three) times daily as needed for anxiety. 08/13/23 08/12/24  Bennet Brasil, MD  ipratropium-albuterol  (DUONEB) 0.5-2.5 (3) MG/3ML SOLN Take 3 mLs by nebulization every 8 (eight) hours. 12/17/22     levothyroxine  (SYNTHROID ) 50 MCG tablet Take 1 tablet by mouth daily before breakfast. 06/10/23 12/07/23  Nida, Gebreselassie W, MD  lidocaine  (LIDODERM ) 5 % Place 1 patch onto the skin daily. Remove & Discard patch within 12 hours or as directed by MD 04/09/23   Mason Sole, Pratik D, DO  lidocaine  (LIDODERM ) 5 % Place 1 patch onto the skin daily. Remove and discard within 12 hours as directed by MD 04/02/23   Kommor, Alyse July, MD  linaclotide  (LINZESS ) 290 MCG CAPS capsule Take 1 capsule (290 mcg total) by mouth daily before breakfast. 03/28/22   Bennet Brasil, MD  meclizine  (ANTIVERT ) 25 MG tablet Take 1 tablet (25 mg total) by mouth 2 (two) times daily as needed for  dizziness. Patient not taking: Reported on 08/05/2023 06/18/23   Bennet Brasil, MD  methylPREDNISolone  sodium succinate  (SOLU-MEDROL , PF,) 125 MG injection ACT-O-VIAL Administer as directed.  Giving IV is preferred, but can also be given IM if IV access is difficult. 09/05/23   Nida, Gebreselassie W, MD  midodrine  (PROAMATINE ) 5 MG tablet Take 1 tablet (5 mg total) by mouth 2 (two) times daily with a meal. 06/19/23 06/18/24  Bennet Brasil, MD  naloxone  (NARCAN ) nasal spray 4 mg/0.1 mL Use as directed for opioid overdose 12/26/22   Bennet Brasil, MD  ondansetron  (ZOFRAN -ODT) 8 MG disintegrating tablet Take 8 mg by mouth every 8 (eight) hours as needed for nausea or vomiting.    [provider]  ondansetron  (ZOFRAN -ODT) 8 MG disintegrating tablet Dissolve 1 tablet (8 mg total) by mouth every 8 (eight) hours as needed. 04/23/23 04/22/24  Bennet Brasil,  MD  oxyCODONE -acetaminophen  (PERCOCET) 10-325 MG tablet Take 1 tablet by mouth every 4 (four) hours as needed for pain. Max 6 /day 07/16/23   Bennet Brasil, MD  oxyCODONE -acetaminophen  (PERCOCET) 10-325 MG tablet Take 1 tablet by mouth every 4 (four) hours as needed for pain. Max 6 per day 09/11/23   Bennet Brasil, MD  oxyCODONE -acetaminophen  (PERCOCET) 10-325 MG tablet Take 1 tablet by mouth every 4 (four) hours as needed for pain. Max 6 tablets per day 08/12/23   Bennet Brasil, MD  pantoprazole  (PROTONIX ) 40 MG tablet Take 1 tablet (40 mg total) by mouth 2 (two) times daily before a meal. 08/13/23 08/12/24  Bennet Brasil, MD  predniSONE  (DELTASONE ) 5 MG tablet Take 1 tablet (5 mg total) by mouth in the morning. 08/01/23     prochlorperazine  (COMPAZINE ) 25 MG suppository Place 1 suppository (25 mg total) rectally every 12 (twelve) hours as needed for nausea or vomiting. 06/02/23   CardamaCamila Cecil, MD  prochlorperazine  (COMPAZINE ) 5 MG tablet Take 1 tablet (5 mg total) by mouth 2 (two) times daily as needed for nausea or vomiting.  07/16/23   Bennet Brasil, MD  rosuvastatin  (CRESTOR ) 20 MG tablet Take 1 tablet (20 mg total) by mouth daily. 08/12/23   Bennet Brasil, MD  senna-docusate (SENOKOT-S) 8.6-50 MG tablet Take 1 tablet by mouth 2 (two) times daily between meals as needed for mild constipation. 03/13/23   Gonfa, Taye T, MD    Allergies as of 08/12/2023 - Review Complete 08/12/2023  Allergen Reaction Noted   Bee venom Anaphylaxis 09/11/2012   Diphenhydramine  Nausea Only and Swelling 09/21/2013   Contrast media [iodinated contrast media]  01/26/2020   Bactrim  [sulfamethoxazole -trimethoprim ] Nausea And Vomiting 01/08/2020   Codeine Itching 12/17/2012   Morphine  and codeine Nausea And Vomiting and Other (See Comments) 09/27/2014    Family History  Problem Relation Age of Onset   Cardiomyopathy Mother    Breast cancer Mother    Prostate cancer Father    Heart attack Maternal Grandmother    Heart attack Maternal Grandfather    Heart attack Paternal Grandmother    Bipolar disorder Daughter    Heart disease Maternal Aunt    Neurofibromatosis Cousin    Colon cancer Neg Hx     Social History   Socioeconomic History   Marital status: Married    Spouse name: Not on file   Number of children: 1   Years of education: Not on file   Highest education level: Not on file  Occupational History   Occupation: Educational psychologist: [employer]  Tobacco Use   Smoking status: Never    Passive exposure: Never   Smokeless tobacco: Never  Vaping Use   Vaping status: Never Used  Substance and Sexual Activity   Alcohol use: No   Drug use: No   Sexual activity: Yes    Birth control/protection: Post-menopausal  Other Topics Concern   Not on file  Social History Narrative   Daily caffeine    Living with husband   Right handed   She has been on disability since 2014.    Social Drivers of Corporate investment banker Strain: Low Risk  (09/21/2022)   Overall Financial Resource Strain (CARDIA)     Difficulty of Paying Living Expenses: Not hard at all  Food Insecurity: No Food Insecurity (04/07/2023)   Hunger Vital Sign    Worried About Running Out of Food in the Last Year:  Never true    Ran Out of Food in the Last Year: Never true  Transportation Needs: No Transportation Needs (04/07/2023)   PRAPARE - Administrator, Civil Service (Medical): No    Lack of Transportation (Non-Medical): No  Physical Activity: Inactive (09/21/2022)   Exercise Vital Sign    Days of Exercise per Week: 0 days    Minutes of Exercise per Session: 0 min  Stress: No Stress Concern Present (01/14/2023)   [employer] of Occupational Health - Occupational Stress Questionnaire    Feeling of Stress : Only a little  Social Connections: Socially Integrated (09/21/2022)   Social Connection and Isolation Panel [NHANES]    Frequency of Communication with Friends and Family: More than three times a week    Frequency of Social Gatherings with Friends and Family: Three times a week    Attends Religious Services: 1 to 4 times per year    Active Member of Clubs or Organizations: No    Attends Banker Meetings: 1 to 4 times per year    Marital Status: Married  Catering manager Violence: Not At Risk (04/07/2023)   Humiliation, Afraid, Rape, and Kick questionnaire    Fear of Current or Ex-Partner: No    Emotionally Abused: No    Physically Abused: No    Sexually Abused: No    Review of Systems: See HPI, otherwise negative ROS  Physical Exam: LMP 05/17/2016  General:   Alert,  Well-developed, well-nourished, pleasant and cooperative in NAD Heart:  Regular rate and rhythm; no murmurs, clicks, rubs,  or gallops. Abdomen: Non-distended, normal bowel sounds.  Soft and nontender without appreciable mass or hepatosplenomegaly.  Pulses:  Normal pulses noted. Extremities:  Without clubbing or edema.  Impression/Plan:    63 year old lady with esophageal dysphagia avoid both an oropharyngeal and an  esophageal component.  Disease.  Patient wants her esophagus stretched again.  Barium study inconclusive for survey of her tubular esophagus.  I have offered her an EGD with esophageal dilation as feasible/appropriate per plan.  The risks, benefits, limitations, alternatives and imponderables have been reviewed with the patient. Potential for esophageal dilation, biopsy, etc. have also been reviewed.  Questions have been answered. All parties agreeable.      Notice: This dictation was prepared with Dragon dictation along with smaller phrase technology. Any transcriptional errors that result from this process are unintentional and may not be corrected upon review.

## 2023-09-11 NOTE — Anesthesia Postprocedure Evaluation (Signed)
 Anesthesia Post Note  Patient: Kathleen [name]  Procedure(s) Performed: ESOPHAGOGASTRODUODENOSCOPY (EGD) WITH PROPOFOL  MALONEY DILATION BIOPSY  Patient location during evaluation: PACU Anesthesia Type: General Level of consciousness: awake and alert Pain management: pain level controlled Vital Signs Assessment: post-procedure vital signs reviewed and stable Respiratory status: spontaneous breathing, nonlabored ventilation, respiratory function stable and patient connected to nasal cannula oxygen  Cardiovascular status: blood pressure returned to baseline and stable Postop Assessment: no apparent nausea or vomiting Anesthetic complications: no   There were no known notable events for this encounter.   Last Vitals:  Vitals:   09/11/23 1157 09/11/23 1228  BP: (!) 150/82 (!) 131/39  Pulse: 93 94  Resp: (!) 24 (!) 26  Temp: 36.8 C 37 C  SpO2: 99% 99%    Last Pain:  Vitals:   09/11/23 1228  TempSrc: Axillary  PainSc: 10-Worst pain ever                 Sandy Crumb

## 2023-09-11 NOTE — Anesthesia Preprocedure Evaluation (Addendum)
 Anesthesia Evaluation  Patient identified by MRN, date of birth, ID band Patient awake    Reviewed: Allergy & Precautions, H&P , NPO status , Patient's Chart, lab work & pertinent test results, reviewed documented beta blocker date and time   History of Anesthesia Complications (+) PONV and history of anesthetic complications  Airway Mallampati: II  TM Distance: >3 FB Neck ROM: full    Dental no notable dental hx. (+) Dental Advisory Given, Teeth Intact   Pulmonary shortness of breath, sleep apnea , COPD,  oxygen  dependent    + decreased breath sounds      Cardiovascular Exercise Tolerance: Good negative cardio ROS Normal cardiovascular exam Rhythm:regular Rate:Normal     Neuro/Psych  PSYCHIATRIC DISORDERS Anxiety Depression     Neuromuscular disease    GI/Hepatic Neg liver ROS,GERD  Medicated,,  Endo/Other  diabetes, Type 2Hypothyroidism  Class 3 obesityAdrenal insufficiency.  Takes solumedrol every day.  Only took one pill this am  Renal/GU negative Renal ROS  negative genitourinary   Musculoskeletal  (+)  Fibromyalgia -  Abdominal  (+) + obese  Peds  Hematology  (+) Blood dyscrasia, anemia   Anesthesia Other Findings   Reproductive/Obstetrics negative OB ROS                             Anesthesia Physical Anesthesia Plan  ASA: 4  Anesthesia Plan: General   Post-op Pain Management: Minimal or no pain anticipated   Induction:   PONV Risk Score and Plan: Propofol  infusion  Airway Management Planned: Nasal Cannula and Natural Airway  Additional Equipment: None  Intra-op Plan:   Post-operative Plan:   Informed Consent: I have reviewed the patients History and Physical, chart, labs and discussed the procedure including the risks, benefits and alternatives for the proposed anesthesia with the patient or authorized representative who has indicated his/her understanding and  acceptance.     Dental Advisory Given  Plan Discussed with: CRNA  Anesthesia Plan Comments:         Anesthesia Quick Evaluation

## 2023-09-11 NOTE — Discharge Instructions (Signed)
 EGD Discharge instructions Please read the instructions outlined below and refer to this sheet in the next few weeks. These discharge instructions provide you with general information on caring for yourself after you leave the hospital. Your doctor may also give you specific instructions. While your treatment has been planned according to the most current medical practices available, unavoidable complications occasionally occur. If you have any problems or questions after discharge, please call your doctor. ACTIVITY You may resume your regular activity but move at a slower pace for the next 24 hours.  Take frequent rest periods for the next 24 hours.  Walking will help expel (get rid of) the air and reduce the bloated feeling in your abdomen.  No driving for 24 hours (because of the anesthesia (medicine) used during the test).  You may shower.  Do not sign any important legal documents or operate any machinery for 24 hours (because of the anesthesia used during the test).  NUTRITION Drink plenty of fluids.  You may resume your normal diet.  Begin with a light meal and progress to your normal diet.  Avoid alcoholic beverages for 24 hours or as instructed by your caregiver.  MEDICATIONS You may resume your normal medications unless your caregiver tells you otherwise.  WHAT YOU CAN EXPECT TODAY You may experience abdominal discomfort such as a feeling of fullness or "gas" pains.  FOLLOW-UP Your doctor will discuss the results of your test with you.  SEEK IMMEDIATE MEDICAL ATTENTION IF ANY OF THE FOLLOWING OCCUR: Excessive nausea (feeling sick to your stomach) and/or vomiting.  Severe abdominal pain and distention (swelling).  Trouble swallowing.  Temperature over 101 F (37.8 C).  Rectal bleeding or vomiting of blood.       Your esophagus was nicely dilated today     stomach appeared mildly inflamed.  Biopsies taken.      Further recommendations to follow pending review of pathology  report       office visit with us  in 6 to 8 weeks      At patient request, I called dale at [phone]-reviewed findings and recommendations

## 2023-09-12 ENCOUNTER — Other Ambulatory Visit (HOSPITAL_COMMUNITY): Payer: Self-pay

## 2023-09-12 ENCOUNTER — Encounter (HOSPITAL_COMMUNITY): Payer: Self-pay | Admitting: Internal Medicine

## 2023-09-12 LAB — SURGICAL PATHOLOGY

## 2023-09-17 ENCOUNTER — Encounter: Payer: Self-pay | Admitting: Internal Medicine

## 2023-09-17 DIAGNOSIS — J849 Interstitial pulmonary disease, unspecified: Secondary | ICD-10-CM | POA: Diagnosis not present

## 2023-09-17 DIAGNOSIS — R0602 Shortness of breath: Secondary | ICD-10-CM | POA: Diagnosis not present

## 2023-09-17 DIAGNOSIS — D860 Sarcoidosis of lung: Secondary | ICD-10-CM | POA: Diagnosis not present

## 2023-09-19 ENCOUNTER — Other Ambulatory Visit: Payer: Self-pay | Admitting: Family Medicine

## 2023-09-19 ENCOUNTER — Other Ambulatory Visit (HOSPITAL_COMMUNITY): Payer: Self-pay

## 2023-09-19 NOTE — Telephone Encounter (Unsigned)
Copied from CRM [id number]. Topic: Clinical - Medication Refill >> Sep 19, 2023  2:14 PM Shelah Lewandowsky wrote: Most Recent Primary Care Visit:  Provider: [name] A  Department: [facility]  Visit Type: HOSPITAL FOLLOW UP  Date: 08/12/2023  Medication: doxycycline (VIBRA-TABS) 100 MG tablet  Has the patient contacted their pharmacy? No (Agent: If no, request that the patient contact the pharmacy for the refill. If patient does not wish to contact the pharmacy document the reason why and proceed with request.) (Agent: If yes, when and what did the pharmacy advise?) Is going to call pharmacy now  Is this the correct pharmacy for this prescription? Yes If no, delete pharmacy and type the correct one.  This is the patient's preferred pharmacy:    [facility] [address] [place] [address] Phone: [phone] Fax: [phone]    Has the prescription been filled recently? Yes  Is the patient out of the medication? Yes  Has the patient been seen for an appointment in the last year OR does the patient have an upcoming appointment? Yes  Can we respond through MyChart? Yes  Agent: Please be advised that Rx refills may take up to 3 business days. We ask that you follow-up with your pharmacy.

## 2023-09-20 ENCOUNTER — Other Ambulatory Visit (HOSPITAL_COMMUNITY): Payer: Self-pay

## 2023-09-20 ENCOUNTER — Other Ambulatory Visit: Payer: Self-pay

## 2023-09-20 DIAGNOSIS — M1612 Unilateral primary osteoarthritis, left hip: Secondary | ICD-10-CM | POA: Diagnosis not present

## 2023-09-20 MED ORDER — LIDOCAINE 5 % EX PTCH
1.0000 | MEDICATED_PATCH | Freq: Every day | CUTANEOUS | 0 refills | Status: DC
  Filled 2023-09-20: qty 15, 15d supply, fill #0

## 2023-09-20 MED ORDER — PENICILLIN V POTASSIUM 500 MG PO TABS: 500.0000 mg | ORAL_TABLET | Freq: Four times a day (QID) | ORAL | 0 refills | Status: AC

## 2023-09-20 NOTE — Telephone Encounter (Signed)
Oral infections in the mouth need penicillin it is the better antibiotic for oral infections Recommend penicillin VK 500 mg 1 4 times daily for 7 days Patient needs to follow-up with dentist sooner if any problems or follow-up with [place] ASAP if any problems

## 2023-09-23 ENCOUNTER — Other Ambulatory Visit (HOSPITAL_COMMUNITY): Payer: Self-pay

## 2023-09-24 ENCOUNTER — Encounter: Payer: Self-pay | Admitting: Family Medicine

## 2023-09-27 ENCOUNTER — Ambulatory Visit (INDEPENDENT_AMBULATORY_CARE_PROVIDER_SITE_OTHER): Payer: Commercial Managed Care - PPO

## 2023-09-27 VITALS — BP 140/70 | Ht 70.0 in | Wt 240.0 lb

## 2023-09-27 DIAGNOSIS — Z0000 Encounter for general adult medical examination without abnormal findings: Secondary | ICD-10-CM | POA: Diagnosis not present

## 2023-09-27 DIAGNOSIS — Z1231 Encounter for screening mammogram for malignant neoplasm of breast: Secondary | ICD-10-CM

## 2023-09-27 NOTE — Patient Instructions (Signed)
Ms. Gunnerson , Thank you for taking time to come for your Medicare Wellness Visit. I appreciate your ongoing commitment to your health goals. Please review the following plan we discussed and let me know if I can assist you in the future.   Referrals/Orders/Follow-Ups/Clinician Recommendations:   Next Medicare Annual Wellness Visit:  October 02, 2024 at 11:20 am  You have an order for:  []   2D Mammogram  [x]   3D Mammogram  []   Bone Density   []   Lung Cancer Screening  Please call for appointment:   [facility] [address] [place], [place] [address] [phone]  Make sure to wear two-piece clothing.  No lotions powders or deodorants the day of the appointment Make sure to bring picture ID and insurance card.  Bring list of medications you are currently taking including any supplements.   Schedule your [place] screening mammogram through MyChart!   Log into your MyChart account.  Go to 'Visit' (or 'Appointments' if on mobile App) --> Schedule an Appointment  Under 'Select a Reason for Visit' choose the Mammogram Screening option.  Complete the pre-visit questions and select the time and place that best fits your schedule.    This is a list of the screening recommended for you and due dates:  Health Maintenance  Topic Date Due   Zoster (Shingles) Vaccine (1 of 2) Never done   Flu Shot  11/25/2023*   Mammogram  09/28/2023   Pap with HPV screening  06/08/2024   Medicare Annual Wellness Visit  09/26/2024   Pneumococcal Vaccination (3 of 3 - PPSV23 or PCV20) 10/07/2027   Colon Cancer Screening  01/03/2029   Hepatitis C Screening  Completed   HIV Screening  Completed   HPV Vaccine  Aged Out   DTaP/Tdap/Td vaccine  Discontinued   COVID-19 Vaccine  Discontinued  *Topic was postponed. The date shown is not the original due date.    Advanced directives: (ACP Link)Information on Advanced Care Planning can be found at [facility] Advance Health Care Directives Advance Health Care Directives ([email or web address]   Next Medicare Annual Wellness Visit scheduled for next year: yes  Preventive Care 59-54 Years Old, Female Preventive care refers to lifestyle choices and visits with your health care provider that can promote health and wellness. Preventive care visits are also called wellness exams. What can I expect for my preventive care visit? Counseling Your health care provider may ask you questions about your: Medical history, including: Past medical problems. Family medical history. Pregnancy history. Current health, including: Menstrual cycle. Method of birth control. Emotional well-being. Home life and relationship well-being. Sexual activity and sexual health. Lifestyle, including: Alcohol, nicotine or tobacco, and drug use. Access to firearms. Diet, exercise, and sleep habits. Work and work Astronomer. Sunscreen use. Safety issues such as seatbelt and bike helmet use. Physical exam Your health care provider will check your: Height and weight. These may be used to calculate your BMI (body mass index). BMI is a measurement that tells if you are at a healthy weight. Waist circumference. This measures the distance around your waistline. This measurement also tells if you are at a healthy weight and may help predict your risk of certain diseases, such as type 2 diabetes and high blood pressure. Heart rate and blood pressure. Body temperature. Skin for abnormal spots. What immunizations do I need?  Vaccines are usually given at various ages, according to a schedule. Your health care provider  will recommend vaccines for you based on your age, medical history, and lifestyle or other factors, such as travel or where you work. What tests do I need? Screening Your health care provider may recommend screening tests for certain conditions. This may include: Lipid and cholesterol levels. Diabetes  screening. This is done by checking your blood sugar (glucose) after you have not eaten for a while (fasting). Pelvic exam and Pap test. Hepatitis B test. Hepatitis C test. HIV (human immunodeficiency virus) test. STI (sexually transmitted infection) testing, if you are at risk. Lung cancer screening. Colorectal cancer screening. Mammogram. Talk with your health care provider about when you should start having regular mammograms. This may depend on whether you have a family history of breast cancer. BRCA-related cancer screening. This may be done if you have a family history of breast, ovarian, tubal, or peritoneal cancers. Bone density scan. This is done to screen for osteoporosis. Talk with your health care provider about your test results, treatment options, and if necessary, the need for more tests. Follow these instructions at home: Eating and drinking  Eat a diet that includes fresh fruits and vegetables, whole grains, lean protein, and low-fat dairy products. Take vitamin and mineral supplements as recommended by your health care provider. Do not drink alcohol if: Your health care provider tells you not to drink. You are pregnant, may be pregnant, or are planning to become pregnant. If you drink alcohol: Limit how much you have to 0-1 drink a day. Know how much alcohol is in your drink. In the [place] one drink equals one 12 oz bottle of beer (355 mL), one 5 oz glass of wine (148 mL), or one 1 oz glass of hard liquor (44 mL). Lifestyle Brush your teeth every morning and night with fluoride toothpaste. Floss one time each day. Exercise for at least 30 minutes 5 or more days each week. Do not use any products that contain nicotine or tobacco. These products include cigarettes, chewing tobacco, and vaping devices, such as e-cigarettes. If you need help quitting, ask your health care provider. Do not use drugs. If you are sexually active, practice safe sex. Use a condom or other form of  protection to prevent STIs. If you do not wish to become pregnant, use a form of birth control. If you plan to become pregnant, see your health care provider for a prepregnancy visit. Take aspirin only as told by your health care provider. Make sure that you understand how much to take and what form to take. Work with your health care provider to find out whether it is safe and beneficial for you to take aspirin daily. Find healthy ways to manage stress, such as: Meditation, yoga, or listening to music. Journaling. Talking to a trusted person. Spending time with friends and family. Minimize exposure to UV radiation to reduce your risk of skin cancer. Safety Always wear your seat belt while driving or riding in a vehicle. Do not drive: If you have been drinking alcohol. Do not ride with someone who has been drinking. When you are tired or distracted. While texting. If you have been using any mind-altering substances or drugs. Wear a helmet and other protective equipment during sports activities. If you have firearms in your house, make sure you follow all gun safety procedures. Seek help if you have been physically or sexually abused. What's next? Visit your health care provider once a year for an annual wellness visit. Ask your health care provider how often you should  have your eyes and teeth checked. Stay up to date on all vaccines. This information is not intended to replace advice given to you by your health care provider. Make sure you discuss any questions you have with your health care provider. Document Revised: 02/08/2021 Document Reviewed: 02/08/2021 Elsevier Patient Education  2024 [employer]. Understanding Your Risk for Falls Millions of people have serious injuries from falls each year. It is important to understand your risk of falling. Talk with your health care provider about your risk and what you can do to lower it. If you do have a serious fall, make sure to tell  your provider. Falling once raises your risk of falling again. How can falls affect me? Serious injuries from falls are common. These include: Broken bones, such as hip fractures. Head injuries, such as traumatic brain injuries (TBI) or concussions. A fear of falling can cause you to avoid activities and stay at home. This can make your muscles weaker and raise your risk for a fall. What can increase my risk? There are a number of risk factors that increase your risk for falling. The more risk factors you have, the higher your risk of falling. Serious injuries from a fall happen most often to people who are older than 61 years old. Teenagers and young adults ages 16-29 are also at higher risk. Common risk factors include: Weakness in the lower body. Being generally weak or confused due to long-term (chronic) illness. Dizziness or balance problems. Poor vision. Medicines that cause dizziness or drowsiness. These may include: Medicines for your blood pressure, heart, anxiety, insomnia, or swelling (edema). Pain medicines. Muscle relaxants. Other risk factors include: Drinking alcohol. Having had a fall in the past. Having foot pain or wearing improper footwear. Working at a dangerous job. Having any of the following in your home: Tripping hazards, such as floor clutter or loose rugs. Poor lighting. Pets. Having dementia or memory loss. What actions can I take to lower my risk of falling?     Physical activity Stay physically fit. Do strength and balance exercises. Consider taking a regular class to build strength and balance. Yoga and tai chi are good options. Vision Have your eyes checked every year and your prescription for glasses or contacts updated as needed. Shoes and walking aids Wear non-skid shoes. Wear shoes that have rubber soles and low heels. Do not wear high heels. Do not walk around the house in socks or slippers. Use a cane or walker as told by your  provider. Home safety Attach secure railings on both sides of your stairs. Install grab bars for your bathtub, shower, and toilet. Use a non-skid mat in your bathtub or shower. Attach bath mats securely with double-sided, non-slip rug tape. Use good lighting in all rooms. Keep a flashlight near your bed. Make sure there is a clear path from your bed to the bathroom. Use night-lights. Do not use throw rugs. Make sure all carpeting is taped or tacked down securely. Remove all clutter from walkways and stairways, including extension cords. Repair uneven or broken steps and floors. Avoid walking on icy or slippery surfaces. Walk on the grass instead of on icy or slick sidewalks. Use ice melter to get rid of ice on walkways in the winter. Use a cordless phone. Questions to ask your health care provider Can you help me check my risk for a fall? Do any of my medicines make me more likely to fall? Should I take a vitamin D supplement? What  exercises can I do to improve my strength and balance? Should I make an appointment to have my vision checked? Do I need a bone density test to check for weak bones (osteoporosis)? Would it help to use a cane or a walker? Where to find more information Centers for Disease Control and Prevention, STEADI: TonerPromos.no Community-Based Fall Prevention Programs: TonerPromos.no [employer] on Aging: BaseRingTones.pl Contact a health care provider if: You fall at home. You are afraid of falling at home. You feel weak, drowsy, or dizzy. This information is not intended to replace advice given to you by your health care provider. Make sure you discuss any questions you have with your health care provider. Document Revised: 04/16/2022 Document Reviewed: 04/16/2022 Elsevier Patient Education  2024 Elsevier Inc.   Managing Pain Without Opioids Opioids are strong medicines used to treat moderate to severe pain. For some people, especially those who have long-term (chronic)  pain, opioids may not be the best choice for pain management due to: Side effects like nausea, constipation, and sleepiness. The risk of addiction (opioid use disorder). The longer you take opioids, the greater your risk of addiction. Pain that lasts for more than 3 months is called chronic pain. Managing chronic pain usually requires more than one approach and is often provided by a team of health care providers working together (multidisciplinary approach). Pain management may be done at a pain management center or pain clinic. How to manage pain without the use of opioids Use non-opioid medicines Non-opioid medicines for pain may include: Over-the-counter or prescription non-steroidal anti-inflammatory drugs (NSAIDs). These may be the first medicines used for pain. They work well for muscle and bone pain, and they reduce swelling. Acetaminophen. This over-the-counter medicine may work well for milder pain but not swelling. Antidepressants. These may be used to treat chronic pain. A certain type of antidepressant (tricyclics) is often used. These medicines are given in lower doses for pain than when used for depression. Anticonvulsants. These are usually used to treat seizures but may also reduce nerve (neuropathic) pain. Muscle relaxants. These relieve pain caused by sudden muscle tightening (spasms). You may also use a pain medicine that is applied to the skin as a patch, cream, or gel (topical analgesic), such as a numbing medicine. These may cause fewer side effects than medicines taken by mouth. Do certain therapies as directed Some therapies can help with pain management. They include: Physical therapy. You will do exercises to gain strength and flexibility. A physical therapist may teach you exercises to move and stretch parts of your body that are weak, stiff, or painful. You can learn these exercises at physical therapy visits and practice them at home. Physical therapy may also  involve: Massage. Heat wraps or applying heat or cold to affected areas. Electrical signals that interrupt pain signals (transcutaneous electrical nerve stimulation, TENS). Weak lasers that reduce pain and swelling (low-level laser therapy). Signals from your body that help you learn to regulate pain (biofeedback). Occupational therapy. This helps you to learn ways to function at home and work with less pain. Recreational therapy. This involves trying new activities or hobbies, such as a physical activity or drawing. Mental health therapy, including: Cognitive behavioral therapy (CBT). This helps you learn coping skills for dealing with pain. Acceptance and commitment therapy (ACT) to change the way you think and react to pain. Relaxation therapies, including muscle relaxation exercises and mindfulness-based stress reduction. Pain management counseling. This may be individual, family, or group counseling.  Receive medical treatments  Medical treatments for pain management include: Nerve block injections. These may include a pain blocker and anti-inflammatory medicines. You may have injections: Near the spine to relieve chronic back or neck pain. Into joints to relieve back or joint pain. Into nerve areas that supply a painful area to relieve body pain. Into muscles (trigger point injections) to relieve some painful muscle conditions. A medical device placed near your spine to help block pain signals and relieve nerve pain or chronic back pain (spinal cord stimulation device). Acupuncture. Follow these instructions at home Medicines Take over-the-counter and prescription medicines only as told by your health care provider. If you are taking pain medicine, ask your health care providers about possible side effects to watch out for. Do not drive or use heavy machinery while taking prescription opioid pain medicine. Lifestyle  Do not use drugs or alcohol to reduce pain. If you drink alcohol,  limit how much you have to: 0-1 drink a day for women who are not pregnant. 0-2 drinks a day for men. Know how much alcohol is in a drink. In the [place] one drink equals one 12 oz bottle of beer (355 mL), one 5 oz glass of wine (148 mL), or one 1 oz glass of hard liquor (44 mL). Do not use any products that contain nicotine or tobacco. These products include cigarettes, chewing tobacco, and vaping devices, such as e-cigarettes. If you need help quitting, ask your health care provider. Eat a healthy diet and maintain a healthy weight. Poor diet and excess weight may make pain worse. Eat foods that are high in fiber. These include fresh fruits and vegetables, whole grains, and beans. Limit foods that are high in fat and processed sugars, such as fried and sweet foods. Exercise regularly. Exercise lowers stress and may help relieve pain. Ask your health care provider what activities and exercises are safe for you. If your health care provider approves, join an exercise class that combines movement and stress reduction. Examples include yoga and tai chi. Get enough sleep. Lack of sleep may make pain worse. Lower stress as much as possible. Practice stress reduction techniques as told by your therapist. General instructions Work with all your pain management providers to find the treatments that work best for you. You are an important member of your pain management team. There are many things you can do to reduce pain on your own. Consider joining an online or in-person support group for people who have chronic pain. Keep all follow-up visits. This is important. Where to find more information You can find more information about managing pain without opioids from: American Academy of Pain Medicine: painmed.org Institute for Chronic Pain: instituteforchronicpain.org American Chronic Pain Association: theacpa.org Contact a health care provider if: You have side effects from pain medicine. Your pain  gets worse or does not get better with treatments or home therapy. You are struggling with anxiety or depression. Summary Many types of pain can be managed without opioids. Chronic pain may respond better to pain management without opioids. Pain is best managed when you and a team of health care providers work together. Pain management without opioids may include non-opioid medicines, medical treatments, physical therapy, mental health therapy, and lifestyle changes. Tell your health care providers if your pain gets worse or is not being managed well enough. This information is not intended to replace advice given to you by your health care provider. Make sure you discuss any questions you have with your health care provider. Document Revised:  11/23/2020 Document Reviewed: 11/23/2020 Elsevier Patient Education  2024 [employer].

## 2023-09-27 NOTE — Progress Notes (Signed)
Please attest and cosign this visit due to patients primary care provider not being in the office at the time the visit was completed.  Because this visit was a virtual/telehealth visit,  certain criteria was not obtained, such a blood pressure, CBG if applicable, and timed get up and go. Any medications not marked as "taking" were not mentioned during the medication reconciliation part of the visit. Any vitals not documented were not able to be obtained due to this being a telehealth visit or patient was unable to self-report a recent blood pressure reading due to a lack of equipment at home via telehealth. Vitals that have been documented are verbally provided by the patient.  Interactive audio and video telecommunications were attempted between this provider and patient, however failed, due to patient having technical difficulties OR patient did not have access to video capability.  We continued and completed visit with audio only.  Subjective:   Kathleen [name] is a 63 y.o. female who presents for Medicare Annual (Subsequent) preventive examination.  Visit Complete: Virtual I connected with  Kathleen [name] on 09/27/23 by a audio enabled telemedicine application and verified that I am speaking with the correct person using two identifiers.  Patient Location: Home  Provider Location: Home Office  I discussed the limitations of evaluation and management by telemedicine. The patient expressed understanding and agreed to proceed.  Vital Signs: Because this visit was a virtual/telehealth visit, some criteria may be missing or patient reported. Any vitals not documented were not able to be obtained and vitals that have been documented are patient reported.  Patient Medicare AWV questionnaire was completed by the patient on na; I have confirmed that all information answered by patient is correct and no changes since this date.  Cardiac Risk Factors include: advanced age (>57men, >69 women);sedentary  lifestyle;obesity (BMI >30kg/m2)     Objective:    Today's Vitals   09/27/23 0804 09/27/23 0813  BP: (!) 140/70   Weight: 240 lb (108.9 kg)   Height: 5\' 10"  (1.778 m)   PainSc:  8    Body mass index is 34.44 kg/m.     09/27/2023    8:04 AM 09/11/2023   12:01 PM 09/04/2023    9:24 AM 08/05/2023    8:50 AM 06/07/2023   12:12 PM 06/01/2023   11:21 PM 04/26/2023    6:45 PM  Advanced Directives  Does Patient Have a Medical Advance Directive? No No No No No No No  Would patient like information on creating a medical advance directive? No - Patient declined No - Patient declined  No - Patient declined  No - Patient declined No - Patient declined    Current Medications (verified) Outpatient Encounter Medications as of 09/27/2023  Medication Sig   albuterol (PROVENTIL) (2.5 MG/3ML) 0.083% nebulizer solution Take 3 mLs (2.5 mg total) by nebulization every 4 (four) hours as needed for wheezing or shortness of breath.   albuterol (VENTOLIN HFA) 108 (90 Base) MCG/ACT inhaler Inhale 2 puffs into the lungs every 6 (six) hours as needed for wheezing or shortness of breath.   budesonide (PULMICORT) 0.5 MG/2ML nebulizer solution Inhale the contents of 1 vial via nebulizer two (2) times a day.   busPIRone (BUSPAR) 15 MG tablet Take 1 tablet (15 mg) by mouth 3 times daily.   calcitonin, salmon, (MIACALCIN) 200 UNIT/ACT nasal spray Use 1 spray in one nostril per day, alternate nostrils every other day (may have 1 vial she should use this for at  least 2 weeks but may use as long as 4 weeks)   doxycycline (VIBRA-TABS) 100 MG tablet Take 1 tablet (100 mg total) by mouth 2 (two) times daily.   DULoxetine (CYMBALTA) 60 MG capsule Take 1 capsule (60 mg total) by mouth daily.   EPINEPHrine 0.3 mg/0.3 mL IJ SOAJ injection Inject 0.3 mg into the muscle as needed for anaphylaxis.   ferrous sulfate 325 (65 FE) MG EC tablet Take 325 mg by mouth once a week.   furosemide (LASIX) 20 MG tablet Take 1 tablet (20 mg  total) by mouth every Monday, Wednesday, and Friday.   hydrocortisone (CORTEF) 10 MG tablet Take 2 tablets by mouth at 8:00 am and 1 tablet at lunch. (May double dose on sick days x 3-5 days)   hydrOXYzine (ATARAX) 25 MG tablet Take 1 tablet (25 mg total) by mouth 3 (three) times daily as needed for anxiety.   ipratropium-albuterol (DUONEB) 0.5-2.5 (3) MG/3ML SOLN Take 3 mLs by nebulization every 8 (eight) hours.   levothyroxine (SYNTHROID) 50 MCG tablet Take 1 tablet by mouth daily before breakfast.   lidocaine (LIDODERM) 5 % Place 1 patch onto the skin daily. Remove & Discard patch within 12 hours or as directed by MD   lidocaine (LIDODERM) 5 % Place 1 patch onto the skin daily. Remove and discard within 12 hours as directed by MD   lidocaine (LIDODERM) 5 % Apply 1 patch to skin once a day as needed for pain   linaclotide (LINZESS) 290 MCG CAPS capsule Take 1 capsule (290 mcg total) by mouth daily before breakfast.   meclizine (ANTIVERT) 25 MG tablet Take 1 tablet (25 mg total) by mouth 2 (two) times daily as needed for dizziness.   methylPREDNISolone sodium succinate (SOLU-MEDROL, PF,) 125 MG injection ACT-O-VIAL Administer as directed.  Giving IV is preferred, but can also be given IM if IV access is difficult.   midodrine (PROAMATINE) 5 MG tablet Take 1 tablet (5 mg total) by mouth 2 (two) times daily with a meal.   naloxone (NARCAN) nasal spray 4 mg/0.1 mL Use as directed for opioid overdose   ondansetron (ZOFRAN-ODT) 8 MG disintegrating tablet Take 8 mg by mouth every 8 (eight) hours as needed for nausea or vomiting.   ondansetron (ZOFRAN-ODT) 8 MG disintegrating tablet Dissolve 1 tablet (8 mg total) by mouth every 8 (eight) hours as needed.   oxyCODONE-acetaminophen (PERCOCET) 10-325 MG tablet Take 1 tablet by mouth every 4 (four) hours as needed for pain. Max 6 /day   oxyCODONE-acetaminophen (PERCOCET) 10-325 MG tablet Take 1 tablet by mouth every 4 (four) hours as needed for pain. Max 6 per  day   oxyCODONE-acetaminophen (PERCOCET) 10-325 MG tablet Take 1 tablet by mouth every 4 (four) hours as needed for pain. Max 6 tablets per day   pantoprazole (PROTONIX) 40 MG tablet Take 1 tablet (40 mg total) by mouth 2 (two) times daily before a meal.   penicillin v potassium (VEETID) 500 MG tablet Take 1 tablet (500 mg total) by mouth in the morning, at noon, in the evening, and at bedtime for 7 days.   predniSONE (DELTASONE) 5 MG tablet Take 1 tablet (5 mg total) by mouth in the morning.   prochlorperazine (COMPAZINE) 25 MG suppository Place 1 suppository (25 mg total) rectally every 12 (twelve) hours as needed for nausea or vomiting.   prochlorperazine (COMPAZINE) 5 MG tablet Take 1 tablet (5 mg total) by mouth 2 (two) times daily as needed for nausea or  vomiting.   rosuvastatin (CRESTOR) 20 MG tablet Take 1 tablet (20 mg total) by mouth daily.   senna-docusate (SENOKOT-S) 8.6-50 MG tablet Take 1 tablet by mouth 2 (two) times daily between meals as needed for mild constipation.   No facility-administered encounter medications on file as of 09/27/2023.    Allergies (verified) Bee venom, Diphenhydramine, Contrast media [iodinated contrast media], Bactrim [sulfamethoxazole-trimethoprim], Codeine, and Morphine and codeine   History: Past Medical History:  Diagnosis Date   Adrenal insufficiency (HCC)    Anemia    Anxiety    Avascular bone necrosis (HCC)    Breast fibrocystic disorder    Chronic pain    Encounter for long-term opiate analgesic use 02/02/2020   Patient has chronic pain with femoral head avascular necrosis.  Is under pain contract   Fibromyalgia    Gastroesophageal reflux disease    Hypothyroidism    Mitral valve prolapse    Orthostatic hypotension    Peripheral neuropathy    PONV (postoperative nausea and vomiting)    Restrictive lung disease    Moderate to severe   Sarcoidosis    Biopsy proven - [facility]   Sarcoidosis    SOB (shortness of breath)    chronic   Past  Surgical History:  Procedure Laterality Date   BIOPSY  10/17/2018   Procedure: BIOPSY;  Surgeon: Malissa Hippo, MD;  [facility];  Service: Endoscopy;;  duodenum, antrum, gastric body   BIOPSY  03/03/2021   Procedure: BIOPSY;  Surgeon: Malissa Hippo, MD;  [facility];  Service: Endoscopy;;   BIOPSY  09/11/2023   Procedure: BIOPSY;  Surgeon: Corbin Ade, MD;  [facility];  Service: Endoscopy;;   BREAST LUMPECTOMY Bilateral 01/12/2011   CHOLECYSTECTOMY N/A 04/17/2021   Procedure: LAPAROSCOPIC CHOLECYSTECTOMY;  Surgeon: Franky Macho, MD;  Location: [facility];  Service: General;  Laterality: N/A;   COLONOSCOPY WITH PROPOFOL N/A 01/03/2022   Procedure: COLONOSCOPY WITH PROPOFOL;  Surgeon: Malissa Hippo, MD;  [facility];  Service: Endoscopy;  Laterality: N/A;  220   ESOPHAGEAL DILATION N/A 03/03/2021   Procedure: ESOPHAGEAL DILATION;  Surgeon: Malissa Hippo, MD;  [facility];  Service: Endoscopy;  Laterality: N/A;   ESOPHAGOGASTRODUODENOSCOPY (EGD) WITH PROPOFOL N/A 10/17/2018   Procedure: ESOPHAGOGASTRODUODENOSCOPY (EGD) WITH PROPOFOL;  Surgeon: Malissa Hippo, MD;  [facility];  Service: Endoscopy;  Laterality: N/A;  2:25   ESOPHAGOGASTRODUODENOSCOPY (EGD) WITH PROPOFOL N/A 03/03/2021   Procedure: ESOPHAGOGASTRODUODENOSCOPY (EGD) WITH PROPOFOL;  Surgeon: Malissa Hippo, MD;  [facility];  Service: Endoscopy;  Laterality: N/A;  12:15   ESOPHAGOGASTRODUODENOSCOPY (EGD) WITH PROPOFOL N/A 09/11/2023   Procedure: ESOPHAGOGASTRODUODENOSCOPY (EGD) WITH PROPOFOL;  Surgeon: Corbin Ade, MD;  [facility];  Service: Endoscopy;  Laterality: N/A;  2:30 pm, asa 3, LM to see if pt can come earlier   [facility] DILATION N/A 09/11/2023   Procedure: Elease Hashimoto DILATION;  Surgeon: Corbin Ade, MD;  [facility];  Service: Endoscopy;  Laterality: N/A;   ORIF ANKLE FRACTURE Left 03/10/2023    Procedure: OPEN REDUCTION INTERNAL FIXATION (ORIF) ANKLE FRACTURE;  Surgeon: [name], MD;  Location: [facility];  Service: Orthopedics;  Laterality: Left;   POLYPECTOMY  01/03/2022   Procedure: POLYPECTOMY INTESTINAL;  Surgeon: Malissa Hippo, MD;  [facility];  Service: Endoscopy;;   TUBAL LIGATION     Family History  Problem Relation Age of Onset  Cardiomyopathy Mother    Breast cancer Mother    Prostate cancer Father    Heart attack Maternal Grandmother    Heart attack Maternal Grandfather    Heart attack Paternal Grandmother    Bipolar disorder Daughter    Heart disease Maternal Aunt    Neurofibromatosis Cousin    Colon cancer Neg Hx    Social History   Socioeconomic History   Marital status: Married    Spouse name: Not on file   Number of children: 1   Years of education: Not on file   Highest education level: Not on file  Occupational History   Occupation: Educational psychologist: [employer]  Tobacco Use   Smoking status: Never    Passive exposure: Never   Smokeless tobacco: Never  Vaping Use   Vaping status: Never Used  Substance and Sexual Activity   Alcohol use: No   Drug use: No   Sexual activity: Yes    Birth control/protection: Post-menopausal  Other Topics Concern   Not on file  Social History Narrative   Daily caffeine    Living with husband   Right handed   She has been on disability since 2014.    Social Drivers of Corporate investment banker Strain: Low Risk  (09/27/2023)   Overall Financial Resource Strain (CARDIA)    Difficulty of Paying Living Expenses: Not hard at all  Food Insecurity: No Food Insecurity (09/27/2023)   Hunger Vital Sign    Worried About Running Out of Food in the Last Year: Never true    Ran Out of Food in the Last Year: Never true  Transportation Needs: No Transportation Needs (09/27/2023)   PRAPARE - Administrator, Civil Service (Medical): No    Lack of Transportation (Non-Medical):  No  Physical Activity: Inactive (09/27/2023)   Exercise Vital Sign    Days of Exercise per Week: 0 days    Minutes of Exercise per Session: 0 min  Stress: No Stress Concern Present (09/27/2023)   [employer] of Occupational Health - Occupational Stress Questionnaire    Feeling of Stress : Only a little  Social Connections: Moderately Integrated (09/27/2023)   Social Connection and Isolation Panel [NHANES]    Frequency of Communication with Friends and Family: More than three times a week    Frequency of Social Gatherings with Friends and Family: More than three times a week    Attends Religious Services: More than 4 times per year    Active Member of [employer] or Organizations: No    Attends Engineer, structural: Never    Marital Status: Married    Tobacco Counseling Counseling given: Yes   Clinical Intake:  Pre-visit preparation completed: Yes  Pain : 0-10 Pain Score: 8  Pain Type: Chronic pain Pain Location: Hip Pain Orientation: Left Pain Descriptors / Indicators: Aching, Constant, Throbbing Pain Onset: More than a month ago Pain Frequency: Constant     BMI - recorded: 34.44 Nutritional Status: BMI > 30  Obese Nutritional Risks: None Diabetes: No  How often do you need to have someone help you when you read instructions, pamphlets, or other written materials from your doctor or pharmacy?: 1 - Never  Interpreter Needed?: No  Information entered by :: Maryjean Ka CMA   Activities of Daily Living    09/27/2023    8:08 AM 09/04/2023    9:27 AM  In your present state of health, do you have any difficulty performing  the following activities:  Hearing? 0   Vision? 0   Difficulty concentrating or making decisions? 0   Walking or climbing stairs? 0   Dressing or bathing? 0   Doing errands, shopping? 0 0  Preparing Food and eating ? N   Using the Toilet? N   In the past six months, have you accidently leaked urine? N   Do you have problems with loss of bowel  control? N   Managing your Medications? N   Managing your Finances? N   Housekeeping or managing your Housekeeping? N     Patient Care Team: [name], MD as PCP - General (Family Medicine) Anson Fret, MD as Referring Physician (Internal Medicine) Clinton Gallant, RN as [facility] Care Management Eldred Manges, MD as Consulting Physician (Orthopedic Surgery) [name], MD as Consulting Physician (Orthopedic Surgery)  Indicate any recent Medical Services you may have received from other than [facility] providers in the past year (date may be approximate).     Assessment:   This is a routine wellness examination for Daijah.  Hearing/Vision screen Hearing Screening - Comments:: Patient denies any hearing difficulties.   Vision Screening - Comments:: Patient wears reading glasses only. She is up to date with yearly exams. Patient is having cataract surgery on RT eye Oct 02, 2023   Goals Addressed             This Visit's Progress    Patient Stated       Lose weight        Depression Screen    09/27/2023    8:09 AM 05/06/2023    3:28 PM 03/21/2023   11:42 AM 09/21/2022    8:45 AM 08/22/2022   11:39 AM 07/30/2022    3:59 PM 07/14/2021    8:37 AM  PHQ 2/9 Scores  PHQ - 2 Score 0 3 1 0 2 0 0  PHQ- 9 Score 0 5 2  6       Fall Risk    09/27/2023    8:08 AM 07/16/2023    2:18 PM 05/06/2023    3:28 PM 03/21/2023   11:41 AM 09/21/2022    8:41 AM  Fall Risk   Falls in the past year? 1 1 1 1  0  Number falls in past yr: 1 1 1 1  0  Injury with Fall? 1 1 1 1  0  Risk for fall due to : History of fall(s);Impaired balance/gait;Orthopedic patient;Impaired mobility      Follow up Education provided;Falls prevention discussed;Falls evaluation completed    Falls prevention discussed;Education provided;Falls evaluation completed    MEDICARE RISK AT HOME: Medicare Risk at Home Any stairs in or around the home?: Yes If so, are there any without handrails?:  No Home free of loose throw rugs in walkways, pet beds, electrical cords, etc?: Yes Adequate lighting in your home to reduce risk of falls?: Yes Life alert?: No Use of a cane, walker or w/c?: Yes Grab bars in the bathroom?: No Shower chair or bench in shower?: Yes Elevated toilet seat or a handicapped toilet?: Yes  TIMED UP AND GO:  Was the test performed?  No    Cognitive Function:        09/27/2023    8:05 AM 09/21/2022    8:56 AM  6CIT Screen  What Year? 0 points 0 points  What month? 0 points 0 points  What time? 0 points 0 points  Count back from 20 0  points 0 points  Months in reverse 0 points 0 points  Repeat phrase 0 points 0 points  Total Score 0 points 0 points    Immunizations Immunization History  Administered Date(s) Administered   Influenza Whole 06/27/2010, 07/28/2011   Influenza-Unspecified 05/27/2013   Moderna Sars-Covid-2 Vaccination 10/27/2019, 11/27/2019   Pneumococcal Conjugate-13 06/15/2014   Pneumococcal Polysaccharide-23 06/10/2013    TDAP Status: Patient declined vaccine Due, Education has been provided regarding the importance of this vaccine. Advised may receive this vaccine at local pharmacy or Health Dept. Aware to provide a copy of the vaccination record if obtained from local pharmacy or Health Dept. Verbalized acceptance and understanding.   Flu Vaccine status: Declined, Education has been provided regarding the importance of this vaccine but patient still declined. Advised may receive this vaccine at local pharmacy or Health Dept. Aware to provide a copy of the vaccination record if obtained from local pharmacy or Health Dept. Verbalized acceptance and understanding.  Pneumococcal vaccine status: Up to date  Covid-19 vaccine status: Declined, Education has been provided regarding the importance of this vaccine but patient still declined. Advised may receive this vaccine at local pharmacy or Health Dept.or vaccine clinic. Aware to provide  a copy of the vaccination record if obtained from local pharmacy or Health Dept. Verbalized acceptance and understanding.  Qualifies for Shingles Vaccine? Yes   Zostavax completed No   Shingrix Completed?: No.    Education has been provided regarding the importance of this vaccine. Patient has been advised to call insurance company to determine out of pocket expense if they have not yet received this vaccine. Advised may also receive vaccine at local pharmacy or Health Dept. Verbalized acceptance and understanding.  Screening Tests Health Maintenance  Topic Date Due   Zoster Vaccines- Shingrix (1 of 2) Never done   Medicare Annual Wellness (AWV)  09/22/2023   INFLUENZA VACCINE  11/25/2023 (Originally 03/28/2023)   MAMMOGRAM  09/28/2023   Cervical Cancer Screening (HPV/Pap Cotest)  06/08/2024   Pneumococcal Vaccine 38-23 Years old (3 of 3 - PPSV23 or PCV20) 10/07/2027   Colonoscopy  01/03/2029   Hepatitis C Screening  Completed   HIV Screening  Completed   HPV VACCINES  Aged Out   DTaP/Tdap/Td  Discontinued   COVID-19 Vaccine  Discontinued    Health Maintenance  Health Maintenance Due  Topic Date Due   Zoster Vaccines- Shingrix (1 of 2) Never done   Medicare Annual Wellness (AWV)  09/22/2023    Colorectal cancer screening: Type of screening: Colonoscopy. Completed 01/03/2022. Repeat every 7 years  Mammogram status: Ordered 09/27/2023. Pt provided with contact info and advised to call to schedule appt.   Bone Density Screening: Not age appropriate for this patient.   Lung Cancer Screening: (Low Dose CT Chest recommended if Age 58-80 years, 20 pack-year currently smoking OR have quit w/in 15years.) does not qualify.   Lung Cancer Screening Referral: na  Additional Screening:  Hepatitis C Screening: does not qualify; Completed   Vision Screening: Recommended annual ophthalmology exams for early detection of glaucoma and other disorders of the eye. Is the patient up to date with  their annual eye exam?  Yes  Who is the provider or what is the name of the office in which the patient attends annual eye exams? Dr. Sinda Du [facility]  Dental Screening: Recommended annual dental exams for proper oral hygiene  Diabetic Foot Exam: na  Community Resource Referral / Chronic Care Management: CRR required this visit?  No  CCM required this visit?  No     Plan:     I have personally reviewed and noted the following in the patient's chart:   Medical and social history Use of alcohol, tobacco or illicit drugs  Current medications and supplements including opioid prescriptions. Patient is currently taking opioid prescriptions. Information provided to patient regarding non-opioid alternatives. Patient advised to discuss non-opioid treatment plan with their provider. Functional ability and status Nutritional status Physical activity Advanced directives List of other physicians Hospitalizations, surgeries, and ER visits in previous 12 months Vitals Screenings to include cognitive, depression, and falls Referrals and appointments  In addition, I have reviewed and discussed with patient certain preventive protocols, quality metrics, and best practice recommendations. A written personalized care plan for preventive services as well as general preventive health recommendations were provided to patient.     Jordan Hawks Chellsie Gomer, CMA   09/27/2023   After Visit Summary: (Mail) Due to this being a telephonic visit, the after visit summary with patients personalized plan was offered to patient via mail   Nurse Notes: see routing comment

## 2023-09-28 ENCOUNTER — Other Ambulatory Visit (HOSPITAL_COMMUNITY): Payer: Self-pay

## 2023-09-30 ENCOUNTER — Telehealth: Payer: Self-pay | Admitting: *Deleted

## 2023-09-30 ENCOUNTER — Other Ambulatory Visit (HOSPITAL_COMMUNITY): Payer: Self-pay

## 2023-09-30 DIAGNOSIS — M1612 Unilateral primary osteoarthritis, left hip: Secondary | ICD-10-CM | POA: Diagnosis not present

## 2023-09-30 NOTE — Telephone Encounter (Signed)
Patient was notified that antibiotic for oral infection was sent in 09/20/23 by Dr Lorin Picket.

## 2023-09-30 NOTE — Telephone Encounter (Signed)
Copied from CRM [id number]. Topic: Clinical - Prescription Issue >> Sep 30, 2023 10:48 AM Phill Myron wrote: Reason for CRM:  Patient Kathleen [name] is calling back regarding her medication she never received and what is the status on it? She would like to get her tooth removed . Please advise.

## 2023-10-09 DIAGNOSIS — H2511 Age-related nuclear cataract, right eye: Secondary | ICD-10-CM | POA: Diagnosis not present

## 2023-10-09 DIAGNOSIS — H25811 Combined forms of age-related cataract, right eye: Secondary | ICD-10-CM | POA: Diagnosis not present

## 2023-10-11 ENCOUNTER — Other Ambulatory Visit: Payer: Self-pay

## 2023-10-11 ENCOUNTER — Other Ambulatory Visit: Payer: Self-pay | Admitting: Family Medicine

## 2023-10-11 ENCOUNTER — Other Ambulatory Visit (HOSPITAL_COMMUNITY): Payer: Self-pay

## 2023-10-11 MED ORDER — BUSPIRONE HCL 15 MG PO TABS
15.0000 mg | ORAL_TABLET | Freq: Three times a day (TID) | ORAL | 4 refills | Status: DC
  Filled 2023-10-11: qty 90, 30d supply, fill #0
  Filled 2023-11-06: qty 90, 30d supply, fill #1
  Filled 2023-11-22: qty 90, 30d supply, fill #2
  Filled 2023-12-30: qty 90, 30d supply, fill #3

## 2023-10-12 ENCOUNTER — Other Ambulatory Visit (HOSPITAL_COMMUNITY): Payer: Self-pay

## 2023-10-14 ENCOUNTER — Other Ambulatory Visit: Payer: Self-pay | Admitting: "Endocrinology

## 2023-10-14 ENCOUNTER — Other Ambulatory Visit (HOSPITAL_COMMUNITY): Payer: Self-pay

## 2023-10-15 ENCOUNTER — Other Ambulatory Visit (HOSPITAL_COMMUNITY): Payer: Self-pay

## 2023-10-15 DIAGNOSIS — M129 Arthropathy, unspecified: Secondary | ICD-10-CM | POA: Diagnosis not present

## 2023-10-15 DIAGNOSIS — Z6841 Body Mass Index (BMI) 40.0 and over, adult: Secondary | ICD-10-CM | POA: Diagnosis not present

## 2023-10-15 DIAGNOSIS — Z79899 Other long term (current) drug therapy: Secondary | ICD-10-CM | POA: Diagnosis not present

## 2023-10-15 DIAGNOSIS — R030 Elevated blood-pressure reading, without diagnosis of hypertension: Secondary | ICD-10-CM | POA: Diagnosis not present

## 2023-10-15 DIAGNOSIS — G894 Chronic pain syndrome: Secondary | ICD-10-CM | POA: Diagnosis not present

## 2023-10-15 MED ORDER — SOLU-MEDROL (PF) 125 MG IJ SOLR
INTRAMUSCULAR | 1 refills | Status: DC
  Filled 2023-10-15: qty 1, 1d supply, fill #0
  Filled 2023-10-19: qty 1, 1d supply, fill #1

## 2023-10-16 ENCOUNTER — Other Ambulatory Visit: Payer: Self-pay

## 2023-10-16 ENCOUNTER — Other Ambulatory Visit (HOSPITAL_COMMUNITY): Payer: Self-pay

## 2023-10-17 ENCOUNTER — Other Ambulatory Visit (HOSPITAL_COMMUNITY): Payer: Self-pay

## 2023-10-18 DIAGNOSIS — J849 Interstitial pulmonary disease, unspecified: Secondary | ICD-10-CM | POA: Diagnosis not present

## 2023-10-18 DIAGNOSIS — R0602 Shortness of breath: Secondary | ICD-10-CM | POA: Diagnosis not present

## 2023-10-18 DIAGNOSIS — D860 Sarcoidosis of lung: Secondary | ICD-10-CM | POA: Diagnosis not present

## 2023-10-19 ENCOUNTER — Other Ambulatory Visit (HOSPITAL_COMMUNITY): Payer: Self-pay

## 2023-10-19 ENCOUNTER — Other Ambulatory Visit: Payer: Self-pay | Admitting: Family Medicine

## 2023-10-21 ENCOUNTER — Other Ambulatory Visit (HOSPITAL_COMMUNITY): Payer: Self-pay

## 2023-10-21 MED ORDER — HYDROXYZINE HCL 25 MG PO TABS
25.0000 mg | ORAL_TABLET | Freq: Three times a day (TID) | ORAL | 1 refills | Status: DC | PRN
  Filled 2023-10-21 – 2023-10-22 (×2): qty 90, 30d supply, fill #0
  Filled 2023-11-08: qty 90, 30d supply, fill #1
  Filled ????-??-??: fill #0

## 2023-10-22 ENCOUNTER — Ambulatory Visit (INDEPENDENT_AMBULATORY_CARE_PROVIDER_SITE_OTHER): Payer: HMO | Admitting: Family Medicine

## 2023-10-22 ENCOUNTER — Other Ambulatory Visit (HOSPITAL_BASED_OUTPATIENT_CLINIC_OR_DEPARTMENT_OTHER): Payer: Self-pay

## 2023-10-22 ENCOUNTER — Encounter: Payer: Self-pay | Admitting: Family Medicine

## 2023-10-22 ENCOUNTER — Other Ambulatory Visit (HOSPITAL_COMMUNITY): Payer: Self-pay

## 2023-10-22 VITALS — BP 118/73 | HR 96 | Temp 97.7°F | Ht 70.0 in

## 2023-10-22 DIAGNOSIS — E271 Primary adrenocortical insufficiency: Secondary | ICD-10-CM | POA: Diagnosis not present

## 2023-10-22 DIAGNOSIS — F321 Major depressive disorder, single episode, moderate: Secondary | ICD-10-CM

## 2023-10-22 DIAGNOSIS — R600 Localized edema: Secondary | ICD-10-CM

## 2023-10-22 DIAGNOSIS — R7303 Prediabetes: Secondary | ICD-10-CM

## 2023-10-22 DIAGNOSIS — I700 Atherosclerosis of aorta: Secondary | ICD-10-CM

## 2023-10-22 DIAGNOSIS — J9611 Chronic respiratory failure with hypoxia: Secondary | ICD-10-CM | POA: Diagnosis not present

## 2023-10-22 DIAGNOSIS — J849 Interstitial pulmonary disease, unspecified: Secondary | ICD-10-CM | POA: Diagnosis not present

## 2023-10-22 DIAGNOSIS — Z79891 Long term (current) use of opiate analgesic: Secondary | ICD-10-CM

## 2023-10-22 DIAGNOSIS — R0683 Snoring: Secondary | ICD-10-CM

## 2023-10-22 MED ORDER — NYSTATIN 100000 UNIT/ML MT SUSP: OROMUCOSAL | 0 refills | Status: DC

## 2023-10-22 NOTE — Progress Notes (Unsigned)
 Subjective:    Patient ID: Kathleen [name], female    DOB: [birth year], 63 y.o.   MRN: [id number]  Discussed the use of AI scribe software for clinical note transcription with the patient, who gave verbal consent to proceed. This patient was seen today for chronic pain  The medication list was reviewed and updated.   Location of Pain for which the patient has been treated with regarding narcotics: Lower back right hip  Onset of this pain: Present for years   -Compliance with medication: Patient has avascular necrosis of the right hip.  She has been compliant with her medicine takes maximum 6/day her husband helps her with her medicine  - Number patient states they take daily: Maximum 6/day  -Reason for ongoing use of opioids low back pain as well as avascular necrosis of the right hip.  What other measures have been tried outside of opioids cannot take NSAIDs because of her underlying medicines Tylenol alone has not helped  In the ongoing specialists regarding this condition she has seen multiple orthopedists and back specialist none of them have recommended surgery because of her underlying lung issues  -when was the last dose patient took?  Within the past 12 hours  The patient was advised the importance of maintaining medication and not using illegal substances with these.  Here for refills and follow up  The patient was educated that we can provide 3 monthly scripts for their medication, it is their responsibility to follow the instructions.  Side effects or complications from medications: None  Patient is aware that pain medications are meant to minimize the severity of the pain to allow their pain levels to improve to allow for better function. They are aware of that pain medications cannot totally remove their pain.  Due for UDT ( at least once per year) (pain management contract is also completed at the time of the UDT): Up-to-date on urine drug  Scale of 1 to 10 ( 1 is  least 10 is most) Your pain level without the medicine: 9 Your pain level with medication 6  Scale 1 to 10 ( 1-helps very little, 10 helps very well) How well does your pain medication reduce your pain so you can function better through out the day? 4  Quality of the pain: Severe aching stabbing  Persistence of the pain: Present all the time  Modifying factors: Worse with activity      History of Present Illness   Kathleen [name] is a 63 year old female with chronic respiratory issues who presents with oral thrush and breathing difficulties.  She presents with oral thrush, experiencing a burning sensation on her tongue akin to a burn. She has attempted relief with mouthwash without success. Recent antibiotic use is noted, which may have contributed to the development of thrush.  She experiences breathing difficulties, particularly nocturnal, with oxygen saturation dropping to 89%. She uses supplemental oxygen at 4 liters per minute, increased to 5 liters during episodes of difficulty. A hole in her oxygen line was identified and repaired. She is on prednisone 5 mg daily and has a history of stable but unimproved lung condition as noted by a pulmonologist in December.  She reports swelling in her feet affecting mobility. She takes furosemide three times a week (Monday, Wednesday, Friday). Occasional falls occur, and she uses a walker for stability.  She experiences chronic pain in her back, hip, and foot, with a history of foot surgery. She uses pain medication, taking  about six tablets a day, and has had a hip injection that provided relief for only a week. Concerns about the pain management process and the environment of the pain management clinic are expressed.  She reports bowel movement issues, with infrequent movements despite trying various remedies, including X-Lax. She has also experienced weight loss concerns but notes that insurance does not cover weight loss medications.          Review of Systems     Objective:    Physical Exam   CHEST: Lungs clear to auscultation bilaterally. EXTREMITIES: Mild swelling in extremities.     General-in no acute distress Eyes-no discharge Lungs-respiratory rate normal, CTA CV-no murmurs,RRR Extremities skin warm dry mild edema Neuro grossly normal Behavior normal, alert  {Labs (Optional):23779}     Assessment & Plan:  Assessment and Plan    Oral Thrush Patient reports oral discomfort and white patches consistent with oral thrush, likely secondary to recent antibiotic use. -Prescribe Nystatin oral solution, 4 times daily. -Prescribe Diflucan tablet.  Chronic Obstructive Pulmonary Disease (COPD) Patient reports difficulty breathing with oxygen saturation dropping to 89% at night. Patient is on 4 liters of oxygen, increased to 5 liters as needed. Recent issue with oxygen delivery due to hole in the line, now resolved. -Continue current oxygen therapy. -Consider sleep study to evaluate for sleep apnea.  Lower Extremity Edema Patient reports difficulty walking due to swollen feet. -Increase Furosemide to daily dosing. -Add low dose Potassium supplement. -Order blood work in 7-10 days to monitor electrolyte levels.  Chronic Pain Management Patient reports chronic back and hip pain, currently managed with Oxycodone. Discussed the need for transition to pain management specialist due to high dose opioid use. -Continue current pain management regimen. -Initiate referral to pain management specialist.  Follow-up in 8-10 weeks.     1. Interstitial lung disease (HCC) (Primary) Patient is under the care of Dr. Dudley Major at [facility] patient is on maximal medical therapy we did discuss avoiding respiratory illnesses  2. Chronic respiratory failure with hypoxia (HCC) She does need her oxygen she is to continue this maximum 5 L/day per nasal cannula  3. Encounter for long-term opiate analgesic use The patient was seen in followup  for chronic pain. A review over at their current pain status was discussed. Drug registry was checked. Prescriptions were given.  Regular follow-up recommended. Discussion was held regarding the importance of compliance with medication as well as pain medication contract.  Patient was informed that medication may cause drowsiness and should not be combined  with other medications/alcohol or street drugs. If the patient feels medication is causing altered alertness then do not drive or operate dangerous equipment.  Should be noted that the patient appears to be meeting appropriate use of opioids and response.  Evidenced by improved function and decent pain control without significant side effects and no evidence of overt aberrancy issues.  Upon discussion with the patient today they understand that opioid therapy is optional and they feel that the pain has been refractory to reasonable conservative measures and is significant and affecting quality of life enough to warrant ongoing therapy and wishes to continue opioids.  Refills were provided.  [facility] medical Board guidelines regarding the pain medicine has been reviewed.  CDC guidelines most updated 2022 has been reviewed by the prescriber.  PDMP is checked on a regular basis yearly urine drug screen and pain management contract  Treatment plan for this patient includes #1-gentle stretching exercises as shown daily basis 2.  Mild strength exercises 3 times per week #3 continue pain medications #4 notify us if any digression  We are transferring her chronic pain management to pain management center The patient is willing to do so but it is difficult for her to go back and forth because of her debilitated state she understands that we cannot prescribe at this level ongoing  4. Adrenal insufficiency (Addison's disease) (HCC) Under the care of Dr. Fransico Him for this  5. Aortic atherosclerosis (HCC) Trying to keep cholesterol profile under good  control - Basic metabolic panel  6. Depression, major, single episode, moderate (HCC) On antidepressant  7. Morbid (severe) obesity due to excess calories (HCC) Portion control very difficult for patient lose weight with the medication she is on and her debilitated state  8. Prediabetes Concerning that this will turn into diabetes eventually she may be a GLP-1 candidate currently not covered by her insurance - Hemoglobin A1c  9. Pedal edema Increase the diuretic to daily Potassium daily Check lab work within 2 weeks  We will be touching base with Dr. Dudley Major regarding the possibility of this patient doing a sleep study

## 2023-10-23 ENCOUNTER — Other Ambulatory Visit: Payer: Self-pay | Admitting: "Endocrinology

## 2023-10-24 ENCOUNTER — Other Ambulatory Visit (HOSPITAL_COMMUNITY): Payer: Self-pay

## 2023-10-25 DIAGNOSIS — E039 Hypothyroidism, unspecified: Secondary | ICD-10-CM | POA: Diagnosis not present

## 2023-10-25 DIAGNOSIS — E2740 Unspecified adrenocortical insufficiency: Secondary | ICD-10-CM | POA: Diagnosis not present

## 2023-10-26 LAB — COMPREHENSIVE METABOLIC PANEL
ALT: 31 IU/L (ref 0–32)
AST: 15 IU/L (ref 0–40)
Albumin: 4.3 g/dL (ref 3.9–4.9)
Alkaline Phosphatase: 80 IU/L (ref 44–121)
BUN/Creatinine Ratio: 16 (ref 12–28)
BUN: 12 mg/dL (ref 8–27)
Bilirubin Total: <0.2 mg/dL (ref 0.0–1.2)
CO2: 27 mmol/L (ref 20–29)
Calcium: 9.2 mg/dL (ref 8.7–10.3)
Chloride: 94 mmol/L — ABNORMAL LOW (ref 96–106)
Creatinine, Ser: 0.76 mg/dL (ref 0.57–1.00)
Globulin, Total: 2 g/dL (ref 1.5–4.5)
Glucose: 186 mg/dL — ABNORMAL HIGH (ref 70–99)
Potassium: 4.8 mmol/L (ref 3.5–5.2)
Sodium: 139 mmol/L (ref 134–144)
Total Protein: 6.3 g/dL (ref 6.0–8.5)
eGFR: 89 mL/min/{1.73_m2} (ref >59)

## 2023-10-26 LAB — TSH: TSH: 0.879 u[IU]/mL (ref 0.450–4.500)

## 2023-10-26 LAB — T4, FREE: Free T4: 1.19 ng/dL (ref 0.82–1.77)

## 2023-10-28 ENCOUNTER — Encounter: Payer: Self-pay | Admitting: "Endocrinology

## 2023-10-28 ENCOUNTER — Ambulatory Visit (INDEPENDENT_AMBULATORY_CARE_PROVIDER_SITE_OTHER): Payer: HMO | Admitting: "Endocrinology

## 2023-10-28 ENCOUNTER — Other Ambulatory Visit (HOSPITAL_COMMUNITY): Payer: Self-pay

## 2023-10-28 VITALS — BP 128/66 | HR 85 | Ht 70.0 in | Wt 240.0 lb

## 2023-10-28 DIAGNOSIS — R7303 Prediabetes: Secondary | ICD-10-CM | POA: Insufficient documentation

## 2023-10-28 DIAGNOSIS — E2740 Unspecified adrenocortical insufficiency: Secondary | ICD-10-CM

## 2023-10-28 DIAGNOSIS — E782 Mixed hyperlipidemia: Secondary | ICD-10-CM

## 2023-10-28 DIAGNOSIS — E6609 Other obesity due to excess calories: Secondary | ICD-10-CM | POA: Diagnosis not present

## 2023-10-28 DIAGNOSIS — Z6834 Body mass index (BMI) 34.0-34.9, adult: Secondary | ICD-10-CM | POA: Diagnosis not present

## 2023-10-28 DIAGNOSIS — E039 Hypothyroidism, unspecified: Secondary | ICD-10-CM

## 2023-10-28 DIAGNOSIS — E66811 Obesity, class 1: Secondary | ICD-10-CM | POA: Insufficient documentation

## 2023-10-28 MED ORDER — HYDROCORTISONE 10 MG PO TABS
ORAL_TABLET | ORAL | 3 refills | Status: DC
Start: 2023-10-28 — End: 2023-11-14

## 2023-10-28 MED ORDER — WEGOVY 0.25 MG/0.5ML ~~LOC~~ SOAJ: 0.2500 mg | SUBCUTANEOUS | 0 refills | Status: DC

## 2023-10-28 NOTE — Progress Notes (Signed)
 10/28/2023, 3:53 PM     Endocrinology follow-up note  Subjective:    Patient ID: Kathleen [name], female    DOB: 21-Jul-1961, PCP [name], MD   Past Medical History:  Diagnosis Date   Adrenal insufficiency (HCC)    Anemia    Anxiety    Avascular bone necrosis (HCC)    Breast fibrocystic disorder    Chronic pain    Encounter for long-term opiate analgesic use 02/02/2020   Patient has chronic pain with femoral head avascular necrosis.  Is under pain contract   Fibromyalgia    Gastroesophageal reflux disease    Hypothyroidism    Mitral valve prolapse    Orthostatic hypotension    Peripheral neuropathy    PONV (postoperative nausea and vomiting)    Restrictive lung disease    Moderate to severe   Sarcoidosis    Biopsy proven - [facility]   Sarcoidosis    SOB (shortness of breath)    chronic   Past Surgical History:  Procedure Laterality Date   BIOPSY  10/17/2018   Procedure: BIOPSY;  Surgeon: Malissa Hippo, MD;  [facility];  Service: Endoscopy;;  duodenum, antrum, gastric body   BIOPSY  03/03/2021   Procedure: BIOPSY;  Surgeon: Malissa Hippo, MD;  [facility];  Service: Endoscopy;;   BIOPSY  09/11/2023   Procedure: BIOPSY;  Surgeon: Corbin Ade, MD;  [facility];  Service: Endoscopy;;   BREAST LUMPECTOMY Bilateral 01/12/2011   CHOLECYSTECTOMY N/A 04/17/2021   Procedure: LAPAROSCOPIC CHOLECYSTECTOMY;  Surgeon: Franky Macho, MD;  Location: [facility];  Service: General;  Laterality: N/A;   COLONOSCOPY WITH PROPOFOL N/A 01/03/2022   Procedure: COLONOSCOPY WITH PROPOFOL;  Surgeon: Malissa Hippo, MD;  [facility];  Service: Endoscopy;  Laterality: N/A;  220   ESOPHAGEAL DILATION N/A 03/03/2021   Procedure: ESOPHAGEAL DILATION;  Surgeon: Malissa Hippo, MD;  [facility];  Service: Endoscopy;  Laterality: N/A;   ESOPHAGOGASTRODUODENOSCOPY (EGD) WITH  PROPOFOL N/A 10/17/2018   Procedure: ESOPHAGOGASTRODUODENOSCOPY (EGD) WITH PROPOFOL;  Surgeon: Malissa Hippo, MD;  [facility];  Service: Endoscopy;  Laterality: N/A;  2:25   ESOPHAGOGASTRODUODENOSCOPY (EGD) WITH PROPOFOL N/A 03/03/2021   Procedure: ESOPHAGOGASTRODUODENOSCOPY (EGD) WITH PROPOFOL;  Surgeon: Malissa Hippo, MD;  [facility];  Service: Endoscopy;  Laterality: N/A;  12:15   ESOPHAGOGASTRODUODENOSCOPY (EGD) WITH PROPOFOL N/A 09/11/2023   Procedure: ESOPHAGOGASTRODUODENOSCOPY (EGD) WITH PROPOFOL;  Surgeon: Corbin Ade, MD;  [facility];  Service: Endoscopy;  Laterality: N/A;  2:30 pm, asa 3, LM to see if pt can come earlier   [facility] DILATION N/A 09/11/2023   Procedure: Elease Hashimoto DILATION;  Surgeon: Corbin Ade, MD;  [facility];  Service: Endoscopy;  Laterality: N/A;   ORIF ANKLE FRACTURE Left 03/10/2023   Procedure: OPEN REDUCTION INTERNAL FIXATION (ORIF) ANKLE FRACTURE;  Surgeon: [name], MD;  Location: [facility];  Service: Orthopedics;  Laterality: Left;   POLYPECTOMY  01/03/2022   Procedure: POLYPECTOMY INTESTINAL;  Surgeon: Malissa Hippo, MD;  [facility];  Service: Endoscopy;;   TUBAL LIGATION     Social History  Socioeconomic History   Marital status: Married    Spouse name: Not on file   Number of children: 1   Years of education: Not on file   Highest education level: Not on file  Occupational History   Occupation: Midwife Payable    Employer: [employer]  Tobacco Use   Smoking status: Never    Passive exposure: Never   Smokeless tobacco: Never  Vaping Use   Vaping status: Never Used  Substance and Sexual Activity   Alcohol use: No   Drug use: No   Sexual activity: Yes    Birth control/protection: Post-menopausal  Other Topics Concern   Not on file  Social History Narrative   Daily caffeine    Living with husband   Right handed   She has been on disability since 2014.     Social Drivers of Corporate investment banker Strain: Low Risk  (09/27/2023)   Overall Financial Resource Strain (CARDIA)    Difficulty of Paying Living Expenses: Not hard at all  Food Insecurity: No Food Insecurity (09/27/2023)   Hunger Vital Sign    Worried About Running Out of Food in the Last Year: Never true    Ran Out of Food in the Last Year: Never true  Transportation Needs: No Transportation Needs (09/27/2023)   PRAPARE - Administrator, Civil Service (Medical): No    Lack of Transportation (Non-Medical): No  Physical Activity: Inactive (09/27/2023)   Exercise Vital Sign    Days of Exercise per Week: 0 days    Minutes of Exercise per Session: 0 min  Stress: No Stress Concern Present (09/27/2023)   [employer] of Occupational Health - Occupational Stress Questionnaire    Feeling of Stress : Only a little  Social Connections: Moderately Integrated (09/27/2023)   Social Connection and Isolation Panel [NHANES]    Frequency of Communication with Friends and Family: More than three times a week    Frequency of Social Gatherings with Friends and Family: More than three times a week    Attends Religious Services: More than 4 times per year    Active Member of [employer] or Organizations: No    Attends Engineer, structural: Never    Marital Status: Married   Family History  Problem Relation Age of Onset   Cardiomyopathy Mother    Breast cancer Mother    Prostate cancer Father    Heart attack Maternal Grandmother    Heart attack Maternal Grandfather    Heart attack Paternal Grandmother    Bipolar disorder Daughter    Heart disease Maternal Aunt    Neurofibromatosis Cousin    Colon cancer Neg Hx    Outpatient Encounter Medications as of 10/28/2023  Medication Sig   albuterol (PROVENTIL) (2.5 MG/3ML) 0.083% nebulizer solution Take 3 mLs (2.5 mg total) by nebulization every 4 (four) hours as needed for wheezing or shortness of breath.   albuterol (VENTOLIN  HFA) 108 (90 Base) MCG/ACT inhaler Inhale 2 puffs into the lungs every 6 (six) hours as needed for wheezing or shortness of breath.   budesonide (PULMICORT) 0.5 MG/2ML nebulizer solution Inhale the contents of 1 vial via nebulizer two (2) times a day.   busPIRone (BUSPAR) 15 MG tablet Take 1 tablet (15 mg) by mouth 3 times daily.   calcitonin, salmon, (MIACALCIN) 200 UNIT/ACT nasal spray Use 1 spray in one nostril per day, alternate nostrils every other day (may have 1 vial she should use this for at least  2 weeks but may use as long as 4 weeks)   DULoxetine (CYMBALTA) 60 MG capsule Take 1 capsule (60 mg total) by mouth daily.   EPINEPHrine 0.3 mg/0.3 mL IJ SOAJ injection Inject 0.3 mg into the muscle as needed for anaphylaxis.   ferrous sulfate 325 (65 FE) MG EC tablet Take 325 mg by mouth once a week.   furosemide (LASIX) 20 MG tablet Take 1 tablet (20 mg total) by mouth every Monday, Wednesday, and Friday.   hydrOXYzine (ATARAX) 25 MG tablet Take 1 tablet (25 mg total) by mouth 3 (three) times daily as needed for anxiety.   ipratropium-albuterol (DUONEB) 0.5-2.5 (3) MG/3ML SOLN Take 3 mLs by nebulization every 8 (eight) hours.   levothyroxine (SYNTHROID) 50 MCG tablet Take 1 tablet by mouth daily before breakfast.   lidocaine (LIDODERM) 5 % Place 1 patch onto the skin daily. Remove & Discard patch within 12 hours or as directed by MD   lidocaine (LIDODERM) 5 % Place 1 patch onto the skin daily. Remove and discard within 12 hours as directed by MD   lidocaine (LIDODERM) 5 % Apply 1 patch to skin once a day as needed for pain   linaclotide (LINZESS) 290 MCG CAPS capsule Take 1 capsule (290 mcg total) by mouth daily before breakfast.   meclizine (ANTIVERT) 25 MG tablet Take 1 tablet (25 mg total) by mouth 2 (two) times daily as needed for dizziness.   methylPREDNISolone sodium succinate (SOLU-MEDROL, PF,) 125 MG injection ACT-O-VIAL Administer as directed.  Giving IV is preferred, but can also be  given IM if IV access is difficult.   midodrine (PROAMATINE) 5 MG tablet Take 1 tablet (5 mg total) by mouth 2 (two) times daily with a meal.   naloxone (NARCAN) nasal spray 4 mg/0.1 mL Use as directed for opioid overdose   nystatin (MYCOSTATIN) 100000 UNIT/ML suspension 5 ml swish and swallow qid for 7 days   ondansetron (ZOFRAN-ODT) 8 MG disintegrating tablet Take 8 mg by mouth every 8 (eight) hours as needed for nausea or vomiting.   ondansetron (ZOFRAN-ODT) 8 MG disintegrating tablet Dissolve 1 tablet (8 mg total) by mouth every 8 (eight) hours as needed.   oxyCODONE-acetaminophen (PERCOCET) 10-325 MG tablet Take 1 tablet by mouth every 4 (four) hours as needed for pain. Max 6 /day   oxyCODONE-acetaminophen (PERCOCET) 10-325 MG tablet Take 1 tablet by mouth every 4 (four) hours as needed for pain. Max 6 per day   oxyCODONE-acetaminophen (PERCOCET) 10-325 MG tablet Take 1 tablet by mouth every 4 (four) hours as needed for pain. Max 6 tablets per day   pantoprazole (PROTONIX) 40 MG tablet Take 1 tablet (40 mg total) by mouth 2 (two) times daily before a meal.   predniSONE (DELTASONE) 5 MG tablet Take 1 tablet (5 mg total) by mouth in the morning.   prochlorperazine (COMPAZINE) 25 MG suppository Place 1 suppository (25 mg total) rectally every 12 (twelve) hours as needed for nausea or vomiting.   prochlorperazine (COMPAZINE) 5 MG tablet Take 1 tablet (5 mg total) by mouth 2 (two) times daily as needed for nausea or vomiting.   rosuvastatin (CRESTOR) 20 MG tablet Take 1 tablet (20 mg total) by mouth daily.   Semaglutide-Weight Management (WEGOVY) 0.25 MG/0.5ML SOAJ Inject 0.25 mg into the skin once a week.   senna-docusate (SENOKOT-S) 8.6-50 MG tablet Take 1 tablet by mouth 2 (two) times daily between meals as needed for mild constipation.   [DISCONTINUED] hydrocortisone (CORTEF) 10 MG tablet  Take 2 tablets by mouth at 8:00 am and 1 tablet at lunch. (May double dose on sick days x 3-5 days)    hydrocortisone (CORTEF) 10 MG tablet Take 2 tablets by mouth at 8:00 am and 1 tablet at lunch. (May double dose on sick days x 3-5 days)   [DISCONTINUED] busPIRone (BUSPAR) 15 MG tablet Take 1 tablet (15 mg) by mouth 3 times daily.   [DISCONTINUED] doxycycline (VIBRA-TABS) 100 MG tablet Doxycycline 100 mg 1 twice daily #14 take with a snack tall glass of liquids, i   [DISCONTINUED] DULoxetine (CYMBALTA) 60 MG capsule Take 1 capsule (60 mg) by mouth daily.   [DISCONTINUED] hydrOXYzine (ATARAX) 25 MG tablet Take 1 tablet (25 mg total) by mouth 3 (three) times daily as needed for anxiety.   [DISCONTINUED] methylPREDNISolone sodium succinate (SOLU-MEDROL, PF,) 125 MG injection ACT-O-VIAL Administer as directed.  Giving IV is preferred, but can also be given IM if IV access is difficult.   [DISCONTINUED] oxyCODONE-acetaminophen (PERCOCET) 10-325 MG tablet Take 1 tablet every 4 hours as needed for pain-- maximum 6 tablets per day 06/14/23   [DISCONTINUED] pantoprazole (PROTONIX) 40 MG tablet Take 1 tablet (40 mg total) by mouth 2 (two) times daily before a meal.   [DISCONTINUED] prochlorperazine (COMPAZINE) 5 MG tablet Take 1 tablet (5 mg total) by mouth 2 (two) times daily as needed for nausea or vomiting.   [DISCONTINUED] rosuvastatin (CRESTOR) 10 MG tablet Take 1 tablet (10 mg total) by mouth daily.   No facility-administered encounter medications on file as of 10/28/2023.   ALLERGIES: Allergies  Allergen Reactions   Bee Venom Anaphylaxis   Diphenhydramine Nausea Only and Swelling    "makes my throat close up"  "makes my throat close up"    "makes my throat close up"  Pt states this is not an allergy. Requests removal.   Contrast Media [Iodinated Contrast Media]     CT chest w/ contrast at OSH followed by SOB resolved w/ single dose steroids, never ENT swelling, intubation or pressors, has had multiple contrasted scans since   Bactrim [Sulfamethoxazole-Trimethoprim] Nausea And Vomiting   Codeine  Itching    Other reaction(s): ITCHING   Morphine And Codeine Nausea And Vomiting and Other (See Comments)    Dizziness  Confusion    VACCINATION STATUS: Immunization History  Administered Date(s) Administered   Influenza Whole 06/27/2010, 07/28/2011   Influenza-Unspecified 05/27/2013   Moderna Sars-Covid-2 Vaccination 10/27/2019, 11/27/2019   Pneumococcal Conjugate-13 06/15/2014   Pneumococcal Polysaccharide-23 06/10/2013    HPI Jahnaya R Pinette is 63 y.o. female who is being seen in follow-up in the management of adrenal insufficiency, hypothyroidism.  -She is on a stable dose of steroids including hydrocortisone 20 mg p.o. daily in the morning, 10 mg p.o. daily at noon.   She is also on prednisone 5 mg p.o. daily from her pulmonologist to treat sarcoidosis. She was recently started on continuous oxygen supplement via nasal cannula.  She has no new complaints today except that she was to achieve some weight loss using GLP-1 receptor agonists. For her hypothyroidism, she is on levothyroxine 50 mcg p.o. daily before breakfast.  She is returning for follow-up.    - Her history is complicated including sarcoidosis which she dealt with for more than the last 10 years.  She is on regular follow-up with her pulmonologist.  Over the years, her therapy included high-dose steroids, up to 60 mg daily of prednisone, for the last 4-6 years.   She is currently on  20 mg p.o. every morning and 10 mg p.o. daily every noon.     -She reports better consistency taking her medications this time.     -She has significant cognitive deficit.  - The details of her diagnosis with adrenal insufficiency  is not available to review. -She is not wearing her medic alert today.   She denies any history of tuberculosis, incarceration, nor overseas travel . -She denies family history of adrenal, pituitary dysfunction. She has family history of hypothyroidism in one sibling. - Denies any history of intra-abdominal  injury. She is on hydrocodone for chronic pain and inhaled bronchodilators. She is known to have orthostatic hypotension for which she is on midodrine.     Review of Systems Limited as above.  Objective:    BP 128/66   Pulse 85   Ht 5\' 10"  (1.778 m)   Wt 240 lb (108.9 kg) Comment: past weight  LMP 05/17/2016   BMI 34.44 kg/m   Wt Readings from Last 3 Encounters:  10/28/23 240 lb (108.9 kg)  09/27/23 240 lb (108.9 kg)  09/11/23 239 lb 13.8 oz (108.8 kg)    Physical Exam    CMP ( most recent) CMP     Component Value Date/Time   NA 139 10/25/2023 1227   K 4.8 10/25/2023 1227   CL 94 (L) 10/25/2023 1227   CO2 27 10/25/2023 1227   GLUCOSE 186 (H) 10/25/2023 1227   GLUCOSE 131 (H) 08/05/2023 0911   BUN 12 10/25/2023 1227   CREATININE 0.76 10/25/2023 1227   CREATININE 0.85 08/26/2014 0831   CALCIUM 9.2 10/25/2023 1227   PROT 6.3 10/25/2023 1227   ALBUMIN 4.3 10/25/2023 1227   AST 15 10/25/2023 1227   ALT 31 10/25/2023 1227   ALKPHOS 80 10/25/2023 1227   BILITOT <0.2 10/25/2023 1227   GFRNONAA >60 08/05/2023 0911   GFRAA 92 05/24/2020 1038     Diabetic Labs (most recent): Lab Results  Component Value Date   HGBA1C 6.2 (H) 07/26/2023   HGBA1C 5.4 08/04/2021   HGBA1C 5.4 02/03/2019     Lipid Panel ( most recent) Lipid Panel     Component Value Date/Time   CHOL 192 04/23/2023 1008   TRIG 192 (H) 04/23/2023 1008   HDL 79 04/23/2023 1008   CHOLHDL 2.4 04/23/2023 1008   CHOLHDL 4.0 08/26/2014 0831   VLDL 31 08/26/2014 0831   LDLCALC 81 04/23/2023 1008      Lab Results  Component Value Date   TSH 0.879 10/25/2023   TSH 0.661 06/02/2023   TSH 0.449 (L) 04/23/2023   TSH 0.715 09/26/2022   TSH 0.668 03/16/2022   TSH 0.088 (L) 09/14/2021   TSH 0.668 05/08/2021   TSH 0.307 (L) 11/09/2020   TSH 0.548 05/24/2020   TSH 0.689 11/20/2019   FREET4 1.19 10/25/2023   FREET4 1.02 06/02/2023   FREET4 1.16 04/23/2023   FREET4 1.11 09/26/2022   FREET4 1.08  03/16/2022   FREET4 1.71 09/14/2021   FREET4 1.06 05/08/2021   FREET4 1.32 11/09/2020   FREET4 1.46 05/24/2020   FREET4 1.25 11/20/2019      Assessment & Plan:   1. Adrenal insufficiency (HCC) -I have reviewed her new set of labs, available records and clinically evaluated this patient. She does have multiple risk factors for adrenal insufficiency  including exposure to high-dose steroids related to her sarcoidosis , as well as opioid therapy related to diffuse pain. -  She has taken prednisone up to 60 mg daily for  up to 6 years prior to her diagnosis with adrenal insufficiency.   She will not need any higher dose of steroids than what she takes. -She is on a stable dose of hydrocortisone 20 mg p.o. daily at 8 AM and 10 mg p.o. daily at noon.  She is also getting an additional 5 mg of prednisone daily related to her pulmonary sarcoidosis.    She did not tolerate an attempt to withdraw her prednisone, nor attempt to lower her dose of hydrocortisone.     I had a lengthy discussion with her about the need to avoid any further unnecessary escalation on steroid replacement.    She is advised to remain on same steroid replacement dose and schedule.   -She will not be considered for work-up by withdrawing steroids supports,  risk of adrenal crisis.   -She does not have enough support to perform steroid injection at home.  She wishes to have an emergency Solu-Medrol IV kit which I discussed and prescribed for her. -She is advised to go to ER if she cannot keep her medications down due to intractable vomiting. - She will be evaluated for the need for mineralocorticoid replacement which may help to stabilize her blood pressure.    - I had a long discussion with her regarding steroids sick day rules, risk of unintended weight gain/diabetes and the need for periodic screening with A1c.  She is also advised to wear medic alert typically her adrenal insufficiency and the need for steroids.  -Her  last A1c was at 6.2% consistent with prediabetes.  High risk for type 2 diabetes very high relation to her chronic exposure to high-dose steroids.     2. Hypothyroidism:  -Her recent labs for thyroid function are consistent with appropriate replacement.  She is advised to continue levothyroxine 50 mcg p.o. daily before breakfast.     - We discussed about the correct intake of her thyroid hormone, on empty stomach at fasting, with water, separated by at least 30 minutes from breakfast and other medications,  and separated by more than 4 hours from calcium, iron, multivitamins, acid reflux medications (PPIs). -Patient is made aware of the fact that thyroid hormone replacement is needed for life, dose to be adjusted by periodic monitoring of thyroid function tests.   She has severe hyperlipidemia, responded to Crestor 10 mg p.o. nightly.  She is advised to continue same dose.  Side effects and precautions discussed with her.   -She is also good candidate for lifestyle medicine.  Whole Food , plant-based diet was discussed with her at length during her previous visit.  She also wants to try GLP-1 receptor agonist for weight loss.  She found a place where she can find these products with discount.  In light of her prediabetes, she may benefit from initiating at a low-dose to advance as she tolerates.  I discussed and prescribed Wegovy 0.25 mg subcutaneously weekly.  Side effects and precautions discussed with her.    - I advised her  to maintain close follow up with [name], MD for primary care needs.   I spent  25  minutes in the care of the patient today including review of labs from Thyroid Function, CMP, and other relevant labs ; imaging/biopsy records (current and previous including abstractions from other facilities); face-to-face time discussing  her lab results and symptoms, medications doses, her options of short and long term treatment based on the latest standards of care /  guidelines;   and  documenting the encounter.  [name]  participated in the discussions, expressed understanding, and voiced agreement with the above plans.  All questions were answered to her satisfaction. she is encouraged to contact clinic should she have any questions or concerns prior to her return visit.    Follow up plan: Return in about 3 months (around 01/28/2024) for Fasting Labs  in AM B4 8, A1c -[place].   Marquis Lunch, MD [facility] [address] [place], [place] [address] Phone: [phone]  Fax: [phone]     10/28/2023, 3:53 PM  This note was partially dictated with voice recognition software. Similar sounding words can be transcribed inadequately or may not  be corrected upon review.

## 2023-10-30 ENCOUNTER — Ambulatory Visit: Payer: HMO | Admitting: Gastroenterology

## 2023-10-30 ENCOUNTER — Encounter: Payer: Self-pay | Admitting: Gastroenterology

## 2023-10-30 NOTE — Progress Notes (Deleted)
 GI Office Note    Referring Provider: [name], MD Primary Care Physician:  [name], MD  Primary Gastroenterologist: Roetta Sessions, MD   Chief Complaint   No chief complaint on file.   History of Present Illness   Kathleen [name] is a 63 y.o. female presenting today for follow-up of EGD.  She was last seen in the office in October 2024.  She has a history of chronic dysphagia seen multiple gastroenterologists, including Dr. Dahlia Bailiff previously, [facility], now Dr. Jena Gauss.   Barium esophagram November 2024 IMPRESSION: 1. Single solid demonstrated a small amount of aspirated barium within the superior aspect trachea. Further evaluation with speech swallow examination (which is typically performed with the patient sitting) could be performed as indicated. 2. Nondiagnostic esophagram secondary to patient's inability to tolerate standing for the procedure.  Modified barium swallow study November 2024 Clinical Impression: Pt's oropharyngeal swallowing was presented to be essentially within functional limits on today's assessment. No penetration or aspiration of any textures or consistencies was observed. Pt with known hx of esophageal dysphagia and observed trace amounts of retrograde flow of various textures back into the pyriform sinuses however a reflexive or cued repeat swallow was effective for clearing retrograde material. Occasional trace oral and pharyngeal residue was visualized, however, overall Pt with efficient and timely oral stage, adequate laryngeal stripping wave and adequate laryngeal vestibule closure and good airway protection. Note Pt is at risk for aspiration given compromised respiratory status, anxiety and GERD. Esophageal sweep revealed standing column of barium with to and fro movement. Radiologist present to confirm. SLP reviewed energy conservation strategies including slow consumption rate and taking breaks to breathe. Recommend continue standard  esophageal strategies and defer to GI for ongoing treatment of esophageal component. Ongoing ST is not recommended or indicated at this time. Pt reported "If I need to go to the hospital right now or puree my food I'll do that". SLP attempted to calm and support Pt educating her that she did not aspirate at all on this assessment and that increased anxiety could be negatively impacting her symptoms. Pt educated at length regarding the findings and results of this assessment. Thank you for this referral.  EGD 08/2023: -Normal esophagus status post dilation.  Multiple gastric mucosa with submucosal petechiae.  Status post biopsy after dilation. -Normal duodenal bulb and second portion duodenum. -Gastric biopsy with no evidence of tumor or infection.  Negative H. pylori.  *** 2020 GES unremarkable 01/2021 EGD gastritis negative H. pylori.  Postdilatation due to dysphagia 03/14/2021 HIDA scan EF 92% symptoms were reproduced. 04/17/2021 cholecystectomy with Dr. Lovell Sheehan 05/09/2021 diarrhea treated with Cholestyramine gradually returned back to baseline constipation and was back on Linzess. 11/07/2021 office visit with [facility] Dr. Karilyn Cota complaining of vomiting and nausea and specifically after 3 or 4.  Thought GES versus adrenal insuff.  IBS constipation and chronic abdominal pain, on several psychotropic medications narcotics.  On Linzess 11/13/2021 nondiagnostic GES study due to vomiting after eating 01/03/2022 colonoscopy For rectal bleeding external hemorrhoids, small polyp ascending colon.  Showed tubular adenoma. Next tcs in 7 years 03/02/2022 CT abdomen pelvis without contrast for abdominal pain, acute nonlocalized with moderate atrophy of the head of and neck of the pancreas mild atherosclerotic disease abdominal aorta and branch vessels, status post cholecystectomy UGI series 02/2022: overall no issues with esophagus, stomach, no obstructions, two small duodenal diverticula   Medications    Current Outpatient Medications  Medication Sig Dispense Refill  albuterol (PROVENTIL) (2.5 MG/3ML) 0.083% nebulizer solution Take 3 mLs (2.5 mg total) by nebulization every 4 (four) hours as needed for wheezing or shortness of breath. 75 mL 2   albuterol (VENTOLIN HFA) 108 (90 Base) MCG/ACT inhaler Inhale 2 puffs into the lungs every 6 (six) hours as needed for wheezing or shortness of breath. 18 g 1   budesonide (PULMICORT) 0.5 MG/2ML nebulizer solution Inhale the contents of 1 vial via nebulizer two (2) times a day. 360 mL 3   busPIRone (BUSPAR) 15 MG tablet Take 1 tablet (15 mg) by mouth 3 times daily. 90 tablet 4   calcitonin, salmon, (MIACALCIN) 200 UNIT/ACT nasal spray Use 1 spray in one nostril per day, alternate nostrils every other day (may have 1 vial she should use this for at least 2 weeks but may use as long as 4 weeks) 3.7 mL 0   DULoxetine (CYMBALTA) 60 MG capsule Take 1 capsule (60 mg total) by mouth daily. 90 capsule 1   EPINEPHrine 0.3 mg/0.3 mL IJ SOAJ injection Inject 0.3 mg into the muscle as needed for anaphylaxis. 1 each 2   ferrous sulfate 325 (65 FE) MG EC tablet Take 325 mg by mouth once a week.     furosemide (LASIX) 20 MG tablet Take 1 tablet (20 mg total) by mouth every Monday, Wednesday, and Friday. 40 tablet 3   hydrocortisone (CORTEF) 10 MG tablet Take 2 tablets by mouth at 8:00 am and 1 tablet at lunch. (May double dose on sick days x 3-5 days) 120 tablet 3   hydrOXYzine (ATARAX) 25 MG tablet Take 1 tablet (25 mg total) by mouth 3 (three) times daily as needed for anxiety. 90 tablet 1   ipratropium-albuterol (DUONEB) 0.5-2.5 (3) MG/3ML SOLN Take 3 mLs by nebulization every 8 (eight) hours. 270 mL 11   levothyroxine (SYNTHROID) 50 MCG tablet Take 1 tablet by mouth daily before breakfast. 90 tablet 1   lidocaine (LIDODERM) 5 % Place 1 patch onto the skin daily. Remove & Discard patch within 12 hours or as directed by MD 30 patch 0   lidocaine (LIDODERM) 5 % Place 1  patch onto the skin daily. Remove and discard within 12 hours as directed by MD 30 patch 0   lidocaine (LIDODERM) 5 % Apply 1 patch to skin once a day as needed for pain 15 patch 0   linaclotide (LINZESS) 290 MCG CAPS capsule Take 1 capsule (290 mcg total) by mouth daily before breakfast. 30 capsule 5   meclizine (ANTIVERT) 25 MG tablet Take 1 tablet (25 mg total) by mouth 2 (two) times daily as needed for dizziness. 15 tablet 1   methylPREDNISolone sodium succinate (SOLU-MEDROL, PF,) 125 MG injection ACT-O-VIAL Administer as directed.  Giving IV is preferred, but can also be given IM if IV access is difficult. 1 each 1   midodrine (PROAMATINE) 5 MG tablet Take 1 tablet (5 mg total) by mouth 2 (two) times daily with a meal. 60 tablet 5   naloxone (NARCAN) nasal spray 4 mg/0.1 mL Use as directed for opioid overdose 2 each 4   nystatin (MYCOSTATIN) 100000 UNIT/ML suspension 5 ml swish and swallow qid for 7 days 150 mL 0   ondansetron (ZOFRAN-ODT) 8 MG disintegrating tablet Take 8 mg by mouth every 8 (eight) hours as needed for nausea or vomiting.     ondansetron (ZOFRAN-ODT) 8 MG disintegrating tablet Dissolve 1 tablet (8 mg total) by mouth every 8 (eight) hours as needed. 15 tablet 5  oxyCODONE-acetaminophen (PERCOCET) 10-325 MG tablet Take 1 tablet by mouth every 4 (four) hours as needed for pain. Max 6 /day 180 tablet 0   oxyCODONE-acetaminophen (PERCOCET) 10-325 MG tablet Take 1 tablet by mouth every 4 (four) hours as needed for pain. Max 6 per day 180 tablet 0   oxyCODONE-acetaminophen (PERCOCET) 10-325 MG tablet Take 1 tablet by mouth every 4 (four) hours as needed for pain. Max 6 tablets per day 180 tablet 0   pantoprazole (PROTONIX) 40 MG tablet Take 1 tablet (40 mg total) by mouth 2 (two) times daily before a meal. 60 tablet 5   predniSONE (DELTASONE) 5 MG tablet Take 1 tablet (5 mg total) by mouth in the morning. 30 tablet 5   prochlorperazine (COMPAZINE) 25 MG suppository Place 1 suppository  (25 mg total) rectally every 12 (twelve) hours as needed for nausea or vomiting. 12 suppository 0   prochlorperazine (COMPAZINE) 5 MG tablet Take 1 tablet (5 mg total) by mouth 2 (two) times daily as needed for nausea or vomiting. 15 tablet 2   rosuvastatin (CRESTOR) 20 MG tablet Take 1 tablet (20 mg total) by mouth daily. 90 tablet 1   Semaglutide-Weight Management (WEGOVY) 0.25 MG/0.5ML SOAJ Inject 0.25 mg into the skin once a week. 2 mL 0   senna-docusate (SENOKOT-S) 8.6-50 MG tablet Take 1 tablet by mouth 2 (two) times daily between meals as needed for mild constipation.     No current facility-administered medications for this visit.    Allergies   Allergies as of 10/30/2023 - Review Complete 10/28/2023  Allergen Reaction Noted   Bee venom Anaphylaxis 09/11/2012   Diphenhydramine Nausea Only and Swelling 09/21/2013   Contrast media [iodinated contrast media]  01/26/2020   Bactrim [sulfamethoxazole-trimethoprim] Nausea And Vomiting 01/08/2020   Codeine Itching 12/17/2012   Morphine and codeine Nausea And Vomiting and Other (See Comments) 09/27/2014     Past Medical History   Past Medical History:  Diagnosis Date   Adrenal insufficiency (HCC)    Anemia    Anxiety    Avascular bone necrosis (HCC)    Breast fibrocystic disorder    Chronic pain    Encounter for long-term opiate analgesic use 02/02/2020   Patient has chronic pain with femoral head avascular necrosis.  Is under pain contract   Fibromyalgia    Gastroesophageal reflux disease    Hypothyroidism    Mitral valve prolapse    Orthostatic hypotension    Peripheral neuropathy    PONV (postoperative nausea and vomiting)    Restrictive lung disease    Moderate to severe   Sarcoidosis    Biopsy proven - [facility]   Sarcoidosis    SOB (shortness of breath)    chronic    Past Surgical History   Past Surgical History:  Procedure Laterality Date   BIOPSY  10/17/2018   Procedure: BIOPSY;  Surgeon: Malissa Hippo, MD;   [facility];  Service: Endoscopy;;  duodenum, antrum, gastric body   BIOPSY  03/03/2021   Procedure: BIOPSY;  Surgeon: Malissa Hippo, MD;  [facility];  Service: Endoscopy;;   BIOPSY  09/11/2023   Procedure: BIOPSY;  Surgeon: Corbin Ade, MD;  [facility];  Service: Endoscopy;;   BREAST LUMPECTOMY Bilateral 01/12/2011   CHOLECYSTECTOMY N/A 04/17/2021   Procedure: LAPAROSCOPIC CHOLECYSTECTOMY;  Surgeon: Franky Macho, MD;  Location: [facility];  Service: General;  Laterality: N/A;   COLONOSCOPY WITH PROPOFOL N/A 01/03/2022   Procedure: COLONOSCOPY WITH PROPOFOL;  Surgeon: Malissa Hippo, MD;  [facility];  Service: Endoscopy;  Laterality: N/A;  220   ESOPHAGEAL DILATION N/A 03/03/2021   Procedure: ESOPHAGEAL DILATION;  Surgeon: Malissa Hippo, MD;  [facility];  Service: Endoscopy;  Laterality: N/A;   ESOPHAGOGASTRODUODENOSCOPY (EGD) WITH PROPOFOL N/A 10/17/2018   Procedure: ESOPHAGOGASTRODUODENOSCOPY (EGD) WITH PROPOFOL;  Surgeon: Malissa Hippo, MD;  [facility];  Service: Endoscopy;  Laterality: N/A;  2:25   ESOPHAGOGASTRODUODENOSCOPY (EGD) WITH PROPOFOL N/A 03/03/2021   Procedure: ESOPHAGOGASTRODUODENOSCOPY (EGD) WITH PROPOFOL;  Surgeon: Malissa Hippo, MD;  [facility];  Service: Endoscopy;  Laterality: N/A;  12:15   ESOPHAGOGASTRODUODENOSCOPY (EGD) WITH PROPOFOL N/A 09/11/2023   Procedure: ESOPHAGOGASTRODUODENOSCOPY (EGD) WITH PROPOFOL;  Surgeon: Corbin Ade, MD;  [facility];  Service: Endoscopy;  Laterality: N/A;  2:30 pm, asa 3, LM to see if pt can come earlier   [facility] DILATION N/A 09/11/2023   Procedure: Elease Hashimoto DILATION;  Surgeon: Corbin Ade, MD;  [facility];  Service: Endoscopy;  Laterality: N/A;   ORIF ANKLE FRACTURE Left 03/10/2023   Procedure: OPEN REDUCTION INTERNAL FIXATION (ORIF) ANKLE FRACTURE;  Surgeon: [name], MD;  Location: [facility];  Service:  Orthopedics;  Laterality: Left;   POLYPECTOMY  01/03/2022   Procedure: POLYPECTOMY INTESTINAL;  Surgeon: Malissa Hippo, MD;  [facility];  Service: Endoscopy;;   TUBAL LIGATION      Past Family History   Family History  Problem Relation Age of Onset   Cardiomyopathy Mother    Breast cancer Mother    Prostate cancer Father    Heart attack Maternal Grandmother    Heart attack Maternal Grandfather    Heart attack Paternal Grandmother    Bipolar disorder Daughter    Heart disease Maternal Aunt    Neurofibromatosis Cousin    Colon cancer Neg Hx     Past Social History   Social History   Socioeconomic History   Marital status: Married    Spouse name: Not on file   Number of children: 1   Years of education: Not on file   Highest education level: Not on file  Occupational History   Occupation: Educational psychologist: [employer]  Tobacco Use   Smoking status: Never    Passive exposure: Never   Smokeless tobacco: Never  Vaping Use   Vaping status: Never Used  Substance and Sexual Activity   Alcohol use: No   Drug use: No   Sexual activity: Yes    Birth control/protection: Post-menopausal  Other Topics Concern   Not on file  Social History Narrative   Daily caffeine    Living with husband   Right handed   She has been on disability since 2014.    Social Drivers of Corporate investment banker Strain: Low Risk  (09/27/2023)   Overall Financial Resource Strain (CARDIA)    Difficulty of Paying Living Expenses: Not hard at all  Food Insecurity: No Food Insecurity (09/27/2023)   Hunger Vital Sign    Worried About Running Out of Food in the Last Year: Never true    Ran Out of Food in the Last Year: Never true  Transportation Needs: No Transportation Needs (09/27/2023)   PRAPARE - Administrator, Civil Service (Medical): No    Lack of Transportation (Non-Medical): No  Physical Activity: Inactive (09/27/2023)   Exercise Vital Sign    Days  of Exercise per Week: 0 days    Minutes of Exercise per Session: 0 min  Stress: No Stress Concern Present (09/27/2023)   [employer] of Occupational Health - Occupational Stress Questionnaire    Feeling of Stress : Only a little  Social Connections: Moderately Integrated (09/27/2023)   Social Connection and Isolation Panel [NHANES]    Frequency of Communication with Friends and Family: More than three times a week    Frequency of Social Gatherings with Friends and Family: More than three times a week    Attends Religious Services: More than 4 times per year    Active Member of [employer] or Organizations: No    Attends Banker Meetings: Never    Marital Status: Married  Catering manager Violence: Not At Risk (09/27/2023)   Humiliation, Afraid, Rape, and Kick questionnaire    Fear of Current or Ex-Partner: No    Emotionally Abused: No    Physically Abused: No    Sexually Abused: No    Review of Systems   General: Negative for anorexia, weight loss, fever, chills, fatigue, weakness. ENT: Negative for hoarseness, difficulty swallowing , nasal congestion. CV: Negative for chest pain, angina, palpitations, dyspnea on exertion, peripheral edema.  Respiratory: Negative for dyspnea at rest, dyspnea on exertion, cough, sputum, wheezing.  GI: See history of present illness. GU:  Negative for dysuria, hematuria, urinary incontinence, urinary frequency, nocturnal urination.  Endo: Negative for unusual weight change.     Physical Exam   LMP 05/17/2016    General: Well-nourished, well-developed in no acute distress.  Eyes: No icterus. Mouth: Oropharyngeal mucosa moist and pink , no lesions erythema or exudate. Lungs: Clear to auscultation bilaterally.  Heart: Regular rate and rhythm, no murmurs rubs or gallops.  Abdomen: Bowel sounds are normal, nontender, nondistended, no hepatosplenomegaly or masses,  no abdominal bruits or hernia , no rebound or guarding.  Rectal: ***   Extremities: No lower extremity edema. No clubbing or deformities. Neuro: Alert and oriented x 4   Skin: Warm and dry, no jaundice.   Psych: Alert and cooperative, normal mood and affect.  Labs   *** Imaging Studies   No results found.  Assessment       PLAN   ***   Leanna Battles. Melvyn Neth, MHS, PA-C [facility]

## 2023-11-01 ENCOUNTER — Other Ambulatory Visit: Payer: Self-pay | Admitting: "Endocrinology

## 2023-11-01 ENCOUNTER — Other Ambulatory Visit (HOSPITAL_COMMUNITY): Payer: Self-pay

## 2023-11-01 MED ORDER — SOLU-MEDROL (PF) 125 MG IJ SOLR
INTRAMUSCULAR | 1 refills | Status: DC
  Filled 2023-11-01: qty 1, 1d supply, fill #0
  Filled 2023-11-22: qty 1, 1d supply, fill #1

## 2023-11-02 ENCOUNTER — Other Ambulatory Visit (HOSPITAL_COMMUNITY): Payer: Self-pay

## 2023-11-02 ENCOUNTER — Other Ambulatory Visit (HOSPITAL_BASED_OUTPATIENT_CLINIC_OR_DEPARTMENT_OTHER): Payer: Self-pay

## 2023-11-04 ENCOUNTER — Other Ambulatory Visit (HOSPITAL_COMMUNITY): Payer: Self-pay

## 2023-11-05 ENCOUNTER — Other Ambulatory Visit (HOSPITAL_COMMUNITY): Payer: Self-pay

## 2023-11-05 ENCOUNTER — Other Ambulatory Visit: Payer: Self-pay | Admitting: Family Medicine

## 2023-11-05 NOTE — Telephone Encounter (Unsigned)
 Copied from CRM [id number]. Topic: Clinical - Medication Refill >> Nov 05, 2023  5:58 PM Martha Clan wrote: Most Recent Primary Care Visit:  Provider: [name] A  Department: [facility]  Visit Type: OFFICE VISIT  Date: 10/22/2023  Medication: Doxycycline  Has the patient contacted their pharmacy? Yes (Agent: If no, request that the patient contact the pharmacy for the refill. If patient does not wish to contact the pharmacy document the reason why and proceed with request.) (Agent: If yes, when and what did the pharmacy advise?)  Is this the correct pharmacy for this prescription? Yes If no, delete pharmacy and type the correct one.  This is the patient's preferred pharmacy:  [facility] [phone] - [place], [place] - [address] AT [facility] OF [address] [place] [address] Phone: [phone] Fax: [phone]    Has the prescription been filled recently? No  Is the patient out of the medication? Yes  Has the patient been seen for an appointment in the last year OR does the patient have an upcoming appointment? Yes  Can we respond through MyChart? Yes Patient states she has an abscess under her arm and she gets the prescription refilled when it happens. Agent: Please be advised that Rx refills may take up to 3 business days. We ask that you follow-up with your pharmacy.

## 2023-11-06 ENCOUNTER — Telehealth: Payer: Self-pay

## 2023-11-06 ENCOUNTER — Other Ambulatory Visit (HOSPITAL_COMMUNITY): Payer: Self-pay

## 2023-11-06 ENCOUNTER — Other Ambulatory Visit: Payer: Self-pay | Admitting: Family Medicine

## 2023-11-06 NOTE — Telephone Encounter (Signed)
 Reason for CRM: pt states her endocrinologist says she can take the [facility].  But pt wants to know if she would be prone to having any side effects due to her immune system if she took the [facility]?    Pt would like Dr Franky Macho to give her a call.  She wanted a virtual w/ him, but no appts available

## 2023-11-07 ENCOUNTER — Other Ambulatory Visit (HOSPITAL_COMMUNITY): Payer: Self-pay

## 2023-11-07 ENCOUNTER — Other Ambulatory Visit: Payer: Self-pay

## 2023-11-07 ENCOUNTER — Other Ambulatory Visit: Payer: Self-pay | Admitting: Family Medicine

## 2023-11-07 DIAGNOSIS — L739 Follicular disorder, unspecified: Secondary | ICD-10-CM

## 2023-11-07 MED ORDER — OXYCODONE-ACETAMINOPHEN 10-325 MG PO TABS
1.0000 | ORAL_TABLET | ORAL | 0 refills | Status: DC | PRN
  Filled 2023-11-07 – 2023-11-09 (×2): qty 180, 30d supply, fill #0

## 2023-11-07 MED ORDER — DOXYCYCLINE HYCLATE 100 MG PO TABS: 100.0000 mg | ORAL_TABLET | Freq: Two times a day (BID) | ORAL | 0 refills | Status: AC

## 2023-11-07 NOTE — Telephone Encounter (Signed)
 Doxycycline 100 mg 1 twice daily for 10 days, warm compresses frequently, follow-up if problems or does not resolve

## 2023-11-07 NOTE — Telephone Encounter (Signed)
 Wegovy-may cause some nausea initially but most people get used to this.  In regards to her underlying immune issues it would not be contraindicated to use this medicine-in other words it would be okay to try.  Typically had started at a low dose and then gradually built up.  Unfortunately health [employer] would not cover this unless patient is diabetic they will not cover this for prediabetes.  Patient has appointment with me in mid April.  Would be wise to keep this  Also with her pain management-please inquire with the patient does she have an appointment with [facility] to do a reevaluation with them regarding her pain management.  Currently she is on a high dose of pain medicine through our office.  She faces 2 choices either 1 get established with pain management for them to take over her pain medicines or #2 on her next visit we will have to discuss gradually tapering her pain medicines  Please give feedback regarding pain management thank you

## 2023-11-07 NOTE — Telephone Encounter (Signed)
 Patient advised of provider's recommendations and stated she is awaiting call back from pain management and will hold on Wegovy- was already advised it would not be covered  Patient would like script of Doxycycline sent to [facility] for an abscess on her arm

## 2023-11-08 ENCOUNTER — Other Ambulatory Visit: Payer: Self-pay | Admitting: Family Medicine

## 2023-11-08 ENCOUNTER — Other Ambulatory Visit (HOSPITAL_COMMUNITY): Payer: Self-pay

## 2023-11-08 DIAGNOSIS — Z79899 Other long term (current) drug therapy: Secondary | ICD-10-CM | POA: Diagnosis not present

## 2023-11-08 DIAGNOSIS — E6609 Other obesity due to excess calories: Secondary | ICD-10-CM | POA: Diagnosis not present

## 2023-11-08 DIAGNOSIS — Z6836 Body mass index (BMI) 36.0-36.9, adult: Secondary | ICD-10-CM | POA: Diagnosis not present

## 2023-11-08 DIAGNOSIS — L739 Follicular disorder, unspecified: Secondary | ICD-10-CM

## 2023-11-08 DIAGNOSIS — Z6841 Body Mass Index (BMI) 40.0 and over, adult: Secondary | ICD-10-CM | POA: Diagnosis not present

## 2023-11-08 DIAGNOSIS — R030 Elevated blood-pressure reading, without diagnosis of hypertension: Secondary | ICD-10-CM | POA: Diagnosis not present

## 2023-11-08 DIAGNOSIS — G894 Chronic pain syndrome: Secondary | ICD-10-CM | POA: Diagnosis not present

## 2023-11-08 NOTE — Telephone Encounter (Signed)
 Patient called, left VM to return the call to the office to give clarification on the Rx she requested. Will need to know the name of the mouthwash and let her know doxycycline was sent to [facility] on [address] on yesterday. Does she want that resent to [facility] as well as the mouthwash?

## 2023-11-08 NOTE — Telephone Encounter (Unsigned)
 Copied from CRM [id number]. Topic: Clinical - Medication Refill >> Nov 08, 2023  9:07 AM Bobbye Morton wrote: Most Recent Primary Care Visit:  Provider: [name] A  Department: [facility]  Visit Type: OFFICE VISIT  Date: 10/22/2023  Medication:  PT would like the mouthwash with  the brush, and  doxycycline (VIBRA-TABS) 100 MG tablet   Has the patient contacted their pharmacy? Yes (Agent: If no, request that the patient contact the pharmacy for the refill. If patient does not wish to contact the pharmacy document the reason why and proceed with request.) (Agent: If yes, when and what did the pharmacy advise?)  Is this the correct pharmacy for this prescription? Yes If no, delete pharmacy and type the correct one.  This is the patient's preferred pharmacy:  [facility] [address] [place] [address] Phone: [phone] Fax: [phone]    Has the prescription been filled recently? Yes, pt pharmacy did not have medication, wants sent to another pharmacy   Is the patient out of the medication? Yes  Has the patient been seen for an appointment in the last year OR does the patient have an upcoming appointment? Yes  Can we respond through MyChart? Yes  Agent: Please be advised that Rx refills may take up to 3 business days. We ask that you follow-up with your pharmacy.

## 2023-11-08 NOTE — Telephone Encounter (Signed)
 Copied from CRM [id number]. Topic: Clinical - Medication Refill >> Nov 08, 2023  9:07 AM Bobbye Morton wrote: Most Recent Primary Care Visit:  Provider: [name] A  Department: [facility]  Visit Type: OFFICE VISIT  Date: 10/22/2023  Medication:  PT would like the mouthwash with  the brush, and  doxycycline (VIBRA-TABS) 100 MG tablet   Has the patient contacted their pharmacy? Yes (Agent: If no, request that the patient contact the pharmacy for the refill. If patient does not wish to contact the pharmacy document the reason why and proceed with request.) (Agent: If yes, when and what did the pharmacy advise?)  Is this the correct pharmacy for this prescription? Yes If no, delete pharmacy and type the correct one.  This is the patient's preferred pharmacy:  [facility] [address] [place] [address] Phone: [phone] Fax: [phone]    Has the prescription been filled recently? Yes, pt pharmacy did not have medication, wants sent to another pharmacy   Is the patient out of the medication? Yes  Has the patient been seen for an appointment in the last year OR does the patient have an upcoming appointment? Yes  Can we respond through MyChart? Yes  Agent: Please be advised that Rx refills may take up to 3 business days. We ask that you follow-up with your pharmacy.

## 2023-11-09 ENCOUNTER — Other Ambulatory Visit (HOSPITAL_COMMUNITY): Payer: Self-pay

## 2023-11-09 ENCOUNTER — Other Ambulatory Visit: Payer: Self-pay | Admitting: "Endocrinology

## 2023-11-11 ENCOUNTER — Other Ambulatory Visit: Payer: Self-pay | Admitting: Family Medicine

## 2023-11-11 ENCOUNTER — Other Ambulatory Visit (HOSPITAL_COMMUNITY): Payer: Self-pay

## 2023-11-11 DIAGNOSIS — G8929 Other chronic pain: Secondary | ICD-10-CM | POA: Diagnosis not present

## 2023-11-11 DIAGNOSIS — Z79899 Other long term (current) drug therapy: Secondary | ICD-10-CM | POA: Diagnosis not present

## 2023-11-11 DIAGNOSIS — M87052 Idiopathic aseptic necrosis of left femur: Secondary | ICD-10-CM | POA: Diagnosis not present

## 2023-11-11 DIAGNOSIS — M25552 Pain in left hip: Secondary | ICD-10-CM | POA: Diagnosis not present

## 2023-11-11 MED ORDER — NALOXONE HCL 4 MG/0.1ML NA LIQD
1.0000 | NASAL | 1 refills | Status: DC | PRN
Start: 2023-11-11 — End: 2024-01-03
  Filled 2023-11-11: qty 2, 30d supply, fill #0

## 2023-11-11 NOTE — Telephone Encounter (Signed)
 Copied from CRM [id number]. Topic: Clinical - Medication Refill >> Nov 11, 2023  3:15 PM Emylou G wrote: Most Recent Primary Care Visit:  Provider: [name] A  Department: [facility]  Visit Type: OFFICE VISIT  Date: 10/22/2023  Medication: nystatin (MYCOSTATIN) 100000 UNIT/ML suspension   Has the patient contacted their pharmacy? Yes (Agent: If no, request that the patient contact the pharmacy for the refill. If patient does not wish to contact the pharmacy document the reason why and proceed with request.) (Agent: If yes, when and what did the pharmacy advise?) not rcvd  Is this the correct pharmacy for this prescription? Yes If no, delete pharmacy and type the correct one.  This is the patient's preferred pharmacy:   [facility] [address] [place] [address] Phone: [phone] Fax: [phone]     Has the prescription been filled recently? No  Is the patient out of the medication? Yes  Has the patient been seen for an appointment in the last year OR does the patient have an upcoming appointment? Yes  Can we respond through MyChart? No  Agent: Please be advised that Rx refills may take up to 3 business days. We ask that you follow-up with your pharmacy.

## 2023-11-11 NOTE — Telephone Encounter (Signed)
 Last Fill: 10/22/23  Last OV: 10/22/23 Next OV: 12/17/23  Routing to provider for review/authorization.

## 2023-11-12 ENCOUNTER — Telehealth: Payer: Self-pay | Admitting: "Endocrinology

## 2023-11-12 ENCOUNTER — Other Ambulatory Visit (HOSPITAL_COMMUNITY): Payer: Self-pay

## 2023-11-12 NOTE — Telephone Encounter (Signed)
 Kathleen [name] with [employer] called our after hours line in regards to a PA for [facility]. I have not seen a request for this. Please advise

## 2023-11-14 ENCOUNTER — Other Ambulatory Visit: Payer: Self-pay | Admitting: "Endocrinology

## 2023-11-14 ENCOUNTER — Other Ambulatory Visit (HOSPITAL_COMMUNITY): Payer: Self-pay

## 2023-11-14 ENCOUNTER — Telehealth: Payer: Self-pay

## 2023-11-14 DIAGNOSIS — Z79899 Other long term (current) drug therapy: Secondary | ICD-10-CM | POA: Diagnosis not present

## 2023-11-14 MED FILL — Hydrocortisone Tab 10 MG: ORAL | 30 days supply | Qty: 120 | Fill #0 | Status: CN

## 2023-11-14 NOTE — Telephone Encounter (Signed)
 Noted.

## 2023-11-14 NOTE — Telephone Encounter (Signed)
 Pt has called to check status , any udpate?

## 2023-11-14 NOTE — Telephone Encounter (Signed)
 Pharmacy Patient Advocate Encounter   Received notification from Pt Calls Messages that prior authorization for [facility] is required/requested.   Insurance verification completed.   The patient is insured through [facility] ADVANTAGE/RX ADVANCE .   Per test claim: PA required; PA started via CoverMyMeds. KEY [id number] . Waiting for clinical questions to populate.   PA cancelled: Prior Authorization duplicate/in progress. This other request was not initiated by the PA team, so I do not have any documentation on that.

## 2023-11-15 ENCOUNTER — Other Ambulatory Visit (HOSPITAL_COMMUNITY): Payer: Self-pay

## 2023-11-15 DIAGNOSIS — R0602 Shortness of breath: Secondary | ICD-10-CM | POA: Diagnosis not present

## 2023-11-15 DIAGNOSIS — J849 Interstitial pulmonary disease, unspecified: Secondary | ICD-10-CM | POA: Diagnosis not present

## 2023-11-15 DIAGNOSIS — D860 Sarcoidosis of lung: Secondary | ICD-10-CM | POA: Diagnosis not present

## 2023-11-16 ENCOUNTER — Other Ambulatory Visit (HOSPITAL_COMMUNITY): Payer: Self-pay

## 2023-11-16 MED ORDER — LUBIPROSTONE 8 MCG PO CAPS
8.0000 ug | ORAL_CAPSULE | Freq: Two times a day (BID) | ORAL | 3 refills | Status: DC
  Filled 2023-11-16: qty 60, 30d supply, fill #0

## 2023-11-18 ENCOUNTER — Other Ambulatory Visit: Payer: Self-pay

## 2023-11-18 NOTE — Telephone Encounter (Signed)
 [employer] is waiting on clinical questions.

## 2023-11-20 NOTE — Telephone Encounter (Signed)
 More info needed - the sheets are in onbase

## 2023-11-21 NOTE — Telephone Encounter (Signed)
 I did not initiate PA but I will fax information for an appeal.

## 2023-11-21 NOTE — Telephone Encounter (Signed)
 noted

## 2023-11-22 ENCOUNTER — Other Ambulatory Visit (HOSPITAL_COMMUNITY): Payer: Self-pay

## 2023-11-22 ENCOUNTER — Other Ambulatory Visit: Payer: Self-pay | Admitting: "Endocrinology

## 2023-11-22 ENCOUNTER — Telehealth: Payer: Self-pay

## 2023-11-22 ENCOUNTER — Encounter: Payer: Self-pay | Admitting: Family Medicine

## 2023-11-22 MED ORDER — LEVOTHYROXINE SODIUM 50 MCG PO TABS
50.0000 ug | ORAL_TABLET | Freq: Every day | ORAL | 1 refills | Status: DC
  Filled 2023-11-22: qty 90, 90d supply, fill #0

## 2023-11-22 NOTE — Telephone Encounter (Signed)
 There was dictated please provide to patient

## 2023-11-22 NOTE — Progress Notes (Signed)
 Letter was dictated please provide to patient

## 2023-11-22 NOTE — Telephone Encounter (Signed)
 Amada Jupiter come by said Masey received a letter from the [employer] that Donyetta has jury duty and the courts need a letter from Dr Lorin Picket saying that she is not able to do jury duty. This is time sensitive and they need back ASAP  Call back Amada Jupiter back [phone]

## 2023-11-23 ENCOUNTER — Other Ambulatory Visit: Payer: Self-pay | Admitting: "Endocrinology

## 2023-11-25 NOTE — Patient Instructions (Signed)
 Visit Information  Thank you for taking time to visit with me today. Please don't hesitate to contact me if I can be of assistance to you.   Following are the goals we discussed today:   Goals Addressed             This Visit's Progress    abdominal/bowel pain, nausea, constipation- [facility] care coordination services       Interventions Today    Flowsheet Row Most Recent Value  General Interventions   General Interventions Discussed/Reviewed General Interventions Reviewed, Doctor Visits  [bowel elimination & abdominal pain]  Doctor Visits Discussed/Reviewed Doctor Visits Reviewed, PCP, Specialist  PCP/Specialist Visits Compliance with follow-up visit  Education Interventions   Education Provided Provided Education  Provided Verbal Education On Nutrition, Sick Day Rules, Other  Nutrition Interventions   Nutrition Discussed/Reviewed Nutrition Reviewed, Fluid intake  Pharmacy Interventions   Pharmacy Dicussed/Reviewed Pharmacy Topics Reviewed, Affording Medications              Our next appointment is by telephone on n/a at n/a  Please call the care guide team at [phone] if you need to cancel or reschedule your appointment.   If you are experiencing a Mental Health or Behavioral Health Crisis or need someone to talk to, please call the Suicide and Crisis Lifeline: 988 call the [place] National Suicide Prevention Lifeline: 8164556499 or TTY: 720-074-5234 TTY 405-618-9775) to talk to a trained counselor call 1-800-273-TALK (toll free, 24 hour hotline) call the [facility]: [phone] call 911   Patient verbalizes understanding of instructions and care plan provided today and agrees to view in MyChart. Active MyChart status and patient understanding of how to access instructions and care plan via MyChart confirmed with patient.     The patient has been provided with contact information for the care management team and has been advised to call with any  health related questions or concerns.   Elowen Debruyn L. [name], RN, BSN, CCM [place]  [facility], [facility] RN Care Manager Direct Dial: [phone]  Fax: [phone]

## 2023-11-25 NOTE — Patient Instructions (Signed)
 Visit Information  Thank you for taking time to visit with me today. Please don't hesitate to contact me if I can be of assistance to you.   Following are the goals we discussed today:   Goals Addressed             This Visit's Progress    fall prevention, pain management,nausea, constipation- [facility] care coordination services       Interventions Today    Flowsheet Row Most Recent Value  Chronic Disease   Chronic disease during today's visit Other  [fall, [name]]  General Interventions   General Interventions Discussed/Reviewed General Interventions Reviewed, [facility], Doctor Visits, Communication with  Doctor Visits Discussed/Reviewed Doctor Visits Reviewed  PCP/Specialist Visits Compliance with follow-up visit  Communication with PCP/Specialists, RN  Education Interventions   Education Provided Provided Education  Provided Verbal Education On [employer] benefit, poppa pal benefit, uce of bedside commode]  Mental Health Interventions   Mental Health Discussed/Reviewed Mental Health Reviewed, Coping Strategies, Other  [caregiver, husband had knee surgery]  Safety Interventions   Safety Discussed/Reviewed Safety Reviewed, Fall Risk, Home Safety  Home Safety Assistive Devices              Our next appointment is by telephone on 03/25/23 at 3:30 pm  Please call the care guide team at [phone] if you need to cancel or reschedule your appointment.   If you are experiencing a Mental Health or Behavioral Health Crisis or need someone to talk to, please call the Suicide and Crisis Lifeline: 988 call the [place] National Suicide Prevention Lifeline: 407-450-0197 or TTY: 272-840-9786 TTY 3327578188) to talk to a trained counselor call 1-800-273-TALK (toll free, 24 hour hotline) call the [facility]: [phone] call 911   Patient verbalizes understanding of instructions and care plan provided today and agrees to view  in MyChart. Active MyChart status and patient understanding of how to access instructions and care plan via MyChart confirmed with patient.     The patient has been provided with contact information for the care management team and has been advised to call with any health related questions or concerns.    Kathleen Sako L. [name], RN, BSN, CCM [place]  [facility], [facility] Health RN Care Manager Direct Dial: [phone]  Fax: [phone]

## 2023-11-26 ENCOUNTER — Other Ambulatory Visit (HOSPITAL_COMMUNITY): Payer: Self-pay

## 2023-11-26 MED ORDER — SOLU-MEDROL (PF) 125 MG IJ SOLR
INTRAMUSCULAR | 1 refills | Status: DC
  Filled 2023-11-29 – 2023-12-02 (×2): qty 2, 2d supply, fill #0

## 2023-11-29 ENCOUNTER — Other Ambulatory Visit (HOSPITAL_COMMUNITY): Payer: Self-pay

## 2023-12-02 ENCOUNTER — Other Ambulatory Visit: Payer: Self-pay | Admitting: Family Medicine

## 2023-12-02 ENCOUNTER — Other Ambulatory Visit (HOSPITAL_COMMUNITY): Payer: Self-pay

## 2023-12-02 MED ORDER — HYDROXYZINE HCL 25 MG PO TABS
25.0000 mg | ORAL_TABLET | Freq: Three times a day (TID) | ORAL | 1 refills | Status: DC | PRN
  Filled 2023-12-02: qty 90, 30d supply, fill #0
  Filled 2023-12-30: qty 90, 30d supply, fill #1

## 2023-12-03 ENCOUNTER — Other Ambulatory Visit: Payer: Self-pay | Admitting: "Endocrinology

## 2023-12-05 ENCOUNTER — Other Ambulatory Visit (HOSPITAL_COMMUNITY): Payer: Self-pay

## 2023-12-05 ENCOUNTER — Other Ambulatory Visit: Payer: Self-pay | Admitting: Family Medicine

## 2023-12-05 MED FILL — Hydrocortisone Tab 10 MG: ORAL | 30 days supply | Qty: 120 | Fill #0 | Status: AC

## 2023-12-06 ENCOUNTER — Telehealth: Payer: Self-pay | Admitting: Family Medicine

## 2023-12-06 ENCOUNTER — Encounter: Payer: Self-pay | Admitting: Family Medicine

## 2023-12-06 ENCOUNTER — Other Ambulatory Visit (HOSPITAL_COMMUNITY): Payer: Self-pay

## 2023-12-06 MED ORDER — OXYCODONE-ACETAMINOPHEN 10-325 MG PO TABS
1.0000 | ORAL_TABLET | ORAL | 0 refills | Status: DC | PRN
  Filled 2023-12-06 – 2023-12-09 (×2): qty 180, 30d supply, fill #0

## 2023-12-06 NOTE — Telephone Encounter (Signed)
 Thank you :)

## 2023-12-06 NOTE — Telephone Encounter (Signed)
 Kathleen [name] dictated a letter regarding her pain management.  Please fax this to her pain management group-[facility]-the front should be able to printed off please fax it this afternoon thank you  [name] am hopeful that he will help explain why we will not be taking care of her any longer that way they do not suspect her diversion thank you

## 2023-12-06 NOTE — Telephone Encounter (Signed)
 Patient advised of provider's recommendations and verbalized understanding and stated she has an appointment at [facility] to discuss pain management on Monday 12/09/23

## 2023-12-06 NOTE — Telephone Encounter (Signed)
 I received a request for her pain medicine A prescription for her pain medicine was sent in per her request to [facility]  I certainly support the patient's right to have pain medicine but as she is aware-and should be made re-aware that under the guidelines we are not able to prescribe at this level ongoing. Please reinitiate pain management referral.  She needs to understand that this is not something we can control.  Pain management will decide with her the approach that would be best to help her pain and the safest.  All she previously tried [facility].  I think that would be a good choice for her.  There are very few pain management places that are excepting patients.  Without utilizing pain management our only option would be is to start tapering the dose of the medication on the next prescription therefore necessary to move forward with referral  Also patient needs to schedule a regular follow-up visit somewhere in the June or early July   thank you

## 2023-12-07 ENCOUNTER — Emergency Department (HOSPITAL_COMMUNITY)

## 2023-12-07 ENCOUNTER — Inpatient Hospital Stay (HOSPITAL_COMMUNITY)
Admission: EM | Admit: 2023-12-07 | Discharge: 2023-12-11 | DRG: 543 | Disposition: A | Discharge status: Skilled Nursing Facility | Attending: Internal Medicine | Admitting: Internal Medicine

## 2023-12-07 ENCOUNTER — Other Ambulatory Visit: Payer: Self-pay

## 2023-12-07 ENCOUNTER — Encounter (HOSPITAL_COMMUNITY): Payer: Self-pay

## 2023-12-07 DIAGNOSIS — Z7952 Long term (current) use of systemic steroids: Secondary | ICD-10-CM

## 2023-12-07 DIAGNOSIS — Z7985 Long-term (current) use of injectable non-insulin antidiabetic drugs: Secondary | ICD-10-CM

## 2023-12-07 DIAGNOSIS — Z87892 Personal history of anaphylaxis: Secondary | ICD-10-CM

## 2023-12-07 DIAGNOSIS — E271 Primary adrenocortical insufficiency: Secondary | ICD-10-CM | POA: Diagnosis present

## 2023-12-07 DIAGNOSIS — Z8042 Family history of malignant neoplasm of prostate: Secondary | ICD-10-CM

## 2023-12-07 DIAGNOSIS — M47816 Spondylosis without myelopathy or radiculopathy, lumbar region: Secondary | ICD-10-CM | POA: Diagnosis not present

## 2023-12-07 DIAGNOSIS — R262 Difficulty in walking, not elsewhere classified: Secondary | ICD-10-CM

## 2023-12-07 DIAGNOSIS — R079 Chest pain, unspecified: Secondary | ICD-10-CM | POA: Diagnosis not present

## 2023-12-07 DIAGNOSIS — Z79899 Other long term (current) drug therapy: Secondary | ICD-10-CM

## 2023-12-07 DIAGNOSIS — M25559 Pain in unspecified hip: Secondary | ICD-10-CM | POA: Diagnosis present

## 2023-12-07 DIAGNOSIS — D869 Sarcoidosis, unspecified: Secondary | ICD-10-CM | POA: Diagnosis not present

## 2023-12-07 DIAGNOSIS — J849 Interstitial pulmonary disease, unspecified: Secondary | ICD-10-CM | POA: Diagnosis present

## 2023-12-07 DIAGNOSIS — F1120 Opioid dependence, uncomplicated: Secondary | ICD-10-CM | POA: Diagnosis present

## 2023-12-07 DIAGNOSIS — Z91041 Radiographic dye allergy status: Secondary | ICD-10-CM

## 2023-12-07 DIAGNOSIS — F32A Depression, unspecified: Secondary | ICD-10-CM | POA: Diagnosis present

## 2023-12-07 DIAGNOSIS — M549 Dorsalgia, unspecified: Secondary | ICD-10-CM | POA: Diagnosis not present

## 2023-12-07 DIAGNOSIS — F418 Other specified anxiety disorders: Secondary | ICD-10-CM | POA: Diagnosis present

## 2023-12-07 DIAGNOSIS — M797 Fibromyalgia: Secondary | ICD-10-CM | POA: Diagnosis present

## 2023-12-07 DIAGNOSIS — Z793 Long term (current) use of hormonal contraceptives: Secondary | ICD-10-CM

## 2023-12-07 DIAGNOSIS — R600 Localized edema: Secondary | ICD-10-CM | POA: Diagnosis present

## 2023-12-07 DIAGNOSIS — I10 Essential (primary) hypertension: Secondary | ICD-10-CM | POA: Diagnosis not present

## 2023-12-07 DIAGNOSIS — R339 Retention of urine, unspecified: Secondary | ICD-10-CM | POA: Diagnosis present

## 2023-12-07 DIAGNOSIS — R0789 Other chest pain: Secondary | ICD-10-CM | POA: Diagnosis not present

## 2023-12-07 DIAGNOSIS — Z8249 Family history of ischemic heart disease and other diseases of the circulatory system: Secondary | ICD-10-CM

## 2023-12-07 DIAGNOSIS — Z882 Allergy status to sulfonamides status: Secondary | ICD-10-CM

## 2023-12-07 DIAGNOSIS — M4856XA Collapsed vertebra, not elsewhere classified, lumbar region, initial encounter for fracture: Principal | ICD-10-CM | POA: Diagnosis present

## 2023-12-07 DIAGNOSIS — G4733 Obstructive sleep apnea (adult) (pediatric): Secondary | ICD-10-CM | POA: Diagnosis present

## 2023-12-07 DIAGNOSIS — R52 Pain, unspecified: Secondary | ICD-10-CM | POA: Diagnosis present

## 2023-12-07 DIAGNOSIS — Z91030 Bee allergy status: Secondary | ICD-10-CM

## 2023-12-07 DIAGNOSIS — J9611 Chronic respiratory failure with hypoxia: Secondary | ICD-10-CM | POA: Diagnosis present

## 2023-12-07 DIAGNOSIS — Z818 Family history of other mental and behavioral disorders: Secondary | ICD-10-CM

## 2023-12-07 DIAGNOSIS — L8990 Pressure ulcer of unspecified site, unspecified stage: Secondary | ICD-10-CM | POA: Insufficient documentation

## 2023-12-07 DIAGNOSIS — R338 Other retention of urine: Secondary | ICD-10-CM | POA: Diagnosis present

## 2023-12-07 DIAGNOSIS — Z9981 Dependence on supplemental oxygen: Secondary | ICD-10-CM

## 2023-12-07 DIAGNOSIS — Z803 Family history of malignant neoplasm of breast: Secondary | ICD-10-CM

## 2023-12-07 DIAGNOSIS — E039 Hypothyroidism, unspecified: Secondary | ICD-10-CM | POA: Diagnosis present

## 2023-12-07 DIAGNOSIS — M545 Low back pain, unspecified: Secondary | ICD-10-CM | POA: Diagnosis not present

## 2023-12-07 DIAGNOSIS — Z885 Allergy status to narcotic agent status: Secondary | ICD-10-CM

## 2023-12-07 DIAGNOSIS — D860 Sarcoidosis of lung: Secondary | ICD-10-CM | POA: Diagnosis present

## 2023-12-07 DIAGNOSIS — F419 Anxiety disorder, unspecified: Secondary | ICD-10-CM | POA: Diagnosis present

## 2023-12-07 DIAGNOSIS — K219 Gastro-esophageal reflux disease without esophagitis: Secondary | ICD-10-CM | POA: Diagnosis present

## 2023-12-07 DIAGNOSIS — Z6834 Body mass index (BMI) 34.0-34.9, adult: Secondary | ICD-10-CM

## 2023-12-07 DIAGNOSIS — Z79890 Hormone replacement therapy (postmenopausal): Secondary | ICD-10-CM

## 2023-12-07 DIAGNOSIS — D84821 Immunodeficiency due to drugs: Secondary | ICD-10-CM | POA: Diagnosis present

## 2023-12-07 DIAGNOSIS — G894 Chronic pain syndrome: Secondary | ICD-10-CM | POA: Diagnosis not present

## 2023-12-07 DIAGNOSIS — I959 Hypotension, unspecified: Secondary | ICD-10-CM | POA: Diagnosis present

## 2023-12-07 DIAGNOSIS — J8410 Pulmonary fibrosis, unspecified: Secondary | ICD-10-CM | POA: Diagnosis present

## 2023-12-07 LAB — CBC
HCT: 32.8 % — ABNORMAL LOW (ref 36.0–46.0)
Hemoglobin: 9.7 g/dL — ABNORMAL LOW (ref 12.0–15.0)
MCH: 28.8 pg (ref 26.0–34.0)
MCHC: 29.6 g/dL — ABNORMAL LOW (ref 30.0–36.0)
MCV: 97.3 fL (ref 80.0–100.0)
Platelets: 393 10*3/uL (ref 150–400)
RBC: 3.37 MIL/uL — ABNORMAL LOW (ref 3.87–5.11)
RDW: 15.5 % (ref 11.5–15.5)
WBC: 9.2 10*3/uL (ref 4.0–10.5)
nRBC: 0.5 % — ABNORMAL HIGH (ref 0.0–0.2)

## 2023-12-07 LAB — URINALYSIS, ROUTINE W REFLEX MICROSCOPIC
Bilirubin Urine: NEGATIVE
Glucose, UA: NEGATIVE mg/dL
Hgb urine dipstick: NEGATIVE
Ketones, ur: NEGATIVE mg/dL
Leukocytes,Ua: NEGATIVE
Nitrite: NEGATIVE
Protein, ur: NEGATIVE mg/dL
Specific Gravity, Urine: 1.012 (ref 1.005–1.030)
pH: 5 (ref 5.0–8.0)

## 2023-12-07 LAB — BASIC METABOLIC PANEL WITH GFR
Anion gap: 9 (ref 5–15)
BUN: 13 mg/dL (ref 8–23)
CO2: 32 mmol/L (ref 22–32)
Calcium: 8.4 mg/dL — ABNORMAL LOW (ref 8.9–10.3)
Chloride: 97 mmol/L — ABNORMAL LOW (ref 98–111)
Creatinine, Ser: 0.73 mg/dL (ref 0.44–1.00)
GFR, Estimated: >60 mL/min (ref >60)
Glucose, Bld: 115 mg/dL — ABNORMAL HIGH (ref 70–99)
Potassium: 3.5 mmol/L (ref 3.5–5.1)
Sodium: 138 mmol/L (ref 135–145)

## 2023-12-07 MED ORDER — ALBUTEROL SULFATE (2.5 MG/3ML) 0.083% IN NEBU: 2.5000 mg | INHALATION_SOLUTION | RESPIRATORY_TRACT | Status: DC | PRN

## 2023-12-07 MED ORDER — LINACLOTIDE 145 MCG PO CAPS
290.0000 ug | ORAL_CAPSULE | Freq: Every day | ORAL | Status: DC
  Administered 2023-12-08 – 2023-12-10 (×3): 290 ug via ORAL
  Filled 2023-12-07 (×4): qty 2

## 2023-12-07 MED ORDER — HYDROMORPHONE HCL 1 MG/ML IJ SOLN
1.0000 mg | Freq: Once | INTRAMUSCULAR | Status: AC
  Administered 2023-12-07: 1 mg via INTRAVENOUS
  Filled 2023-12-07: qty 1

## 2023-12-07 MED ORDER — ACETAMINOPHEN 650 MG RE SUPP: 650.0000 mg | Freq: Four times a day (QID) | RECTAL | Status: DC | PRN

## 2023-12-07 MED ORDER — FUROSEMIDE 10 MG/ML IJ SOLN
20.0000 mg | Freq: Once | INTRAMUSCULAR | Status: AC
  Administered 2023-12-07: 20 mg via INTRAVENOUS
  Filled 2023-12-07: qty 2

## 2023-12-07 MED ORDER — BUSPIRONE HCL 5 MG PO TABS
15.0000 mg | ORAL_TABLET | Freq: Three times a day (TID) | ORAL | Status: DC
  Administered 2023-12-07 – 2023-12-11 (×11): 15 mg via ORAL
  Filled 2023-12-07 (×11): qty 3

## 2023-12-07 MED ORDER — HYDROMORPHONE HCL 1 MG/ML IJ SOLN
0.5000 mg | Freq: Once | INTRAMUSCULAR | Status: AC
  Administered 2023-12-07: 0.5 mg via INTRAVENOUS
  Filled 2023-12-07: qty 0.5

## 2023-12-07 MED ORDER — HYDROCORTISONE 5 MG PO TABS
5.0000 mg | ORAL_TABLET | Freq: Every day | ORAL | Status: DC
  Administered 2023-12-08 – 2023-12-10 (×3): 5 mg via ORAL
  Filled 2023-12-07 (×5): qty 1

## 2023-12-07 MED ORDER — POLYETHYLENE GLYCOL 3350 17 G PO PACK
17.0000 g | PACK | Freq: Every day | ORAL | Status: DC | PRN
  Filled 2023-12-07: qty 1

## 2023-12-07 MED ORDER — HYDROCORTISONE 10 MG PO TABS
10.0000 mg | ORAL_TABLET | Freq: Two times a day (BID) | ORAL | Status: DC
Start: 2023-12-07 — End: 2023-12-07

## 2023-12-07 MED ORDER — PREDNISONE 5 MG PO TABS
5.0000 mg | ORAL_TABLET | Freq: Every morning | ORAL | Status: DC
Start: 2023-12-08 — End: 2023-12-11
  Administered 2023-12-08 – 2023-12-11 (×4): 5 mg via ORAL
  Filled 2023-12-07 (×5): qty 1

## 2023-12-07 MED ORDER — BUDESONIDE 0.5 MG/2ML IN SUSP
0.5000 mg | Freq: Two times a day (BID) | RESPIRATORY_TRACT | Status: DC
  Administered 2023-12-08 – 2023-12-11 (×7): 0.5 mg via RESPIRATORY_TRACT
  Filled 2023-12-07 (×8): qty 2

## 2023-12-07 MED ORDER — METHOCARBAMOL 500 MG PO TABS
1000.0000 mg | ORAL_TABLET | Freq: Three times a day (TID) | ORAL | Status: AC
  Administered 2023-12-07 – 2023-12-08 (×3): 1000 mg via ORAL
  Filled 2023-12-07 (×3): qty 2

## 2023-12-07 MED ORDER — HYDROCORTISONE 10 MG PO TABS
10.0000 mg | ORAL_TABLET | Freq: Every day | ORAL | Status: DC
  Administered 2023-12-08 – 2023-12-11 (×4): 10 mg via ORAL
  Filled 2023-12-07 (×6): qty 1

## 2023-12-07 MED ORDER — POTASSIUM CHLORIDE CRYS ER 20 MEQ PO TBCR
40.0000 meq | EXTENDED_RELEASE_TABLET | Freq: Once | ORAL | Status: AC
  Administered 2023-12-07: 40 meq via ORAL
  Filled 2023-12-07: qty 2

## 2023-12-07 MED ORDER — METHOCARBAMOL 500 MG PO TABS
1000.0000 mg | ORAL_TABLET | Freq: Once | ORAL | Status: AC
  Administered 2023-12-07: 1000 mg via ORAL
  Filled 2023-12-07: qty 2

## 2023-12-07 MED ORDER — ACETAMINOPHEN 325 MG PO TABS
650.0000 mg | ORAL_TABLET | Freq: Four times a day (QID) | ORAL | Status: DC | PRN
  Administered 2023-12-07 – 2023-12-09 (×3): 650 mg via ORAL
  Filled 2023-12-07 (×3): qty 2

## 2023-12-07 MED ORDER — HYDROXYZINE HCL 25 MG PO TABS
25.0000 mg | ORAL_TABLET | Freq: Three times a day (TID) | ORAL | Status: DC | PRN
  Administered 2023-12-07 – 2023-12-11 (×7): 25 mg via ORAL
  Filled 2023-12-07 (×8): qty 1

## 2023-12-07 MED ORDER — ROSUVASTATIN CALCIUM 20 MG PO TABS
20.0000 mg | ORAL_TABLET | Freq: Every day | ORAL | Status: DC
  Administered 2023-12-08 – 2023-12-11 (×4): 20 mg via ORAL
  Filled 2023-12-07 (×4): qty 1

## 2023-12-07 MED ORDER — PANTOPRAZOLE SODIUM 40 MG PO TBEC
40.0000 mg | DELAYED_RELEASE_TABLET | Freq: Two times a day (BID) | ORAL | Status: DC
Start: 2023-12-07 — End: 2023-12-11
  Administered 2023-12-07 – 2023-12-11 (×8): 40 mg via ORAL
  Filled 2023-12-07 (×8): qty 1

## 2023-12-07 MED ORDER — ONDANSETRON HCL 4 MG PO TABS: 4.0000 mg | ORAL_TABLET | Freq: Four times a day (QID) | ORAL | Status: DC | PRN

## 2023-12-07 MED ORDER — ONDANSETRON HCL 4 MG/2ML IJ SOLN: 4.0000 mg | Freq: Four times a day (QID) | INTRAMUSCULAR | Status: DC | PRN

## 2023-12-07 MED ORDER — DULOXETINE HCL 60 MG PO CPEP
60.0000 mg | ORAL_CAPSULE | Freq: Every day | ORAL | Status: DC
  Administered 2023-12-08 – 2023-12-11 (×4): 60 mg via ORAL
  Filled 2023-12-07 (×4): qty 1

## 2023-12-07 MED ORDER — LEVOTHYROXINE SODIUM 100 MCG PO TABS
50.0000 ug | ORAL_TABLET | Freq: Every day | ORAL | Status: DC
  Administered 2023-12-08 – 2023-12-11 (×4): 50 ug via ORAL
  Filled 2023-12-07 (×4): qty 1

## 2023-12-07 MED ORDER — FUROSEMIDE 20 MG PO TABS
20.0000 mg | ORAL_TABLET | ORAL | Status: DC
Start: 2023-12-09 — End: 2023-12-11
  Administered 2023-12-09 – 2023-12-11 (×2): 20 mg via ORAL
  Filled 2023-12-07 (×2): qty 1

## 2023-12-07 MED ORDER — DEXAMETHASONE SODIUM PHOSPHATE 10 MG/ML IJ SOLN
10.0000 mg | Freq: Once | INTRAMUSCULAR | Status: AC
  Administered 2023-12-07: 10 mg via INTRAVENOUS
  Filled 2023-12-07: qty 1

## 2023-12-07 MED ORDER — MIDODRINE HCL 5 MG PO TABS
5.0000 mg | ORAL_TABLET | Freq: Two times a day (BID) | ORAL | Status: DC
  Administered 2023-12-08 – 2023-12-11 (×7): 5 mg via ORAL
  Filled 2023-12-07 (×8): qty 1

## 2023-12-07 MED ORDER — ENOXAPARIN SODIUM 60 MG/0.6ML IJ SOSY
55.0000 mg | PREFILLED_SYRINGE | INTRAMUSCULAR | Status: DC
  Administered 2023-12-07 – 2023-12-10 (×4): 55 mg via SUBCUTANEOUS
  Filled 2023-12-07 (×4): qty 0.6

## 2023-12-07 MED ORDER — HYDROMORPHONE HCL 1 MG/ML IJ SOLN
1.0000 mg | INTRAMUSCULAR | Status: DC | PRN
  Administered 2023-12-07 – 2023-12-08 (×3): 1 mg via INTRAVENOUS
  Filled 2023-12-07 (×3): qty 1

## 2023-12-07 MED ORDER — GADOBUTROL 1 MMOL/ML IV SOLN
10.0000 mL | Freq: Once | INTRAVENOUS | Status: AC | PRN
  Administered 2023-12-07: 10 mL via INTRAVENOUS

## 2023-12-07 NOTE — ED Triage Notes (Signed)
 Pt BIB [facility] due to worsening back pain that started in Dec. Pt stated it has gotten worse in the last couple of days. Pt is unable to turn onto back due to pain

## 2023-12-07 NOTE — H&P (Signed)
 History and Physical    EMIKA TIANO ZOX:[id number] DOB: 04-24-1961 DOA: 12/07/2023  PCP: Bennet Brasil, MD   Patient coming from: Home  I have personally briefly reviewed patient's old medical records in [facility] Link  Chief Complaint: Back Pain  HPI: Kathleen [name] is a 63 y.o. female with medical history significant for chronic respiratory failure on 5 L, interstitial lung disease, sarcoidosis, Addison's disease, chronic pain syndrome, anxiety and depression, hypothyroidism. Patient presented to the ED with complaints of sudden onset of back pain.  Back pain started in December, where she reports a few days ago she stood up to walk and felt a pop in her back with subsequent severe pain.  Pain is worse with movement.  She is unable to ambulate.  No numbness or weakness in her lower extremities.  ED Course: Blood pressure 102/41-137/50.  O2 sats 94 to 100% on 5 L.  MRI lumbar spine shows acute/subacute L4 compression fracture. EDP talked to neurosurgeon on-call, recommended LSO brace. Patient unable to void in ED, requiring In-N-Out cath, about 400 mL retained in the bladder. Patient received Dilaudid, dexamethasone 10 mg.  With persistent uncontrolled pain hospitalist was called for admission.  Review of Systems: As per HPI all other systems reviewed and negative.  Past Medical History:  Diagnosis Date   Adrenal insufficiency (HCC)    Anemia    Anxiety    Avascular bone necrosis (HCC)    Breast fibrocystic disorder    Chronic pain    Encounter for long-term opiate analgesic use 02/02/2020   Patient has chronic pain with femoral head avascular necrosis.  Is under pain contract   Fibromyalgia    Gastroesophageal reflux disease    Hypothyroidism    Mitral valve prolapse    Orthostatic hypotension    Peripheral neuropathy    PONV (postoperative nausea and vomiting)    Restrictive lung disease    Moderate to severe   Sarcoidosis    Biopsy proven - [facility]   Sarcoidosis     SOB (shortness of breath)    chronic    Past Surgical History:  Procedure Laterality Date   BIOPSY  10/17/2018   Procedure: BIOPSY;  Surgeon: Ruby Corporal, MD;  [facility];  Service: Endoscopy;;  duodenum, antrum, gastric body   BIOPSY  03/03/2021   Procedure: BIOPSY;  Surgeon: Ruby Corporal, MD;  [facility];  Service: Endoscopy;;   BIOPSY  09/11/2023   Procedure: BIOPSY;  Surgeon: Suzette Espy, MD;  [facility];  Service: Endoscopy;;   BREAST LUMPECTOMY Bilateral 01/12/2011   CHOLECYSTECTOMY N/A 04/17/2021   Procedure: LAPAROSCOPIC CHOLECYSTECTOMY;  Surgeon: Alanda Allegra, MD;  Location: [facility];  Service: General;  Laterality: N/A;   COLONOSCOPY WITH PROPOFOL N/A 01/03/2022   Procedure: COLONOSCOPY WITH PROPOFOL;  Surgeon: Ruby Corporal, MD;  [facility];  Service: Endoscopy;  Laterality: N/A;  220   ESOPHAGEAL DILATION N/A 03/03/2021   Procedure: ESOPHAGEAL DILATION;  Surgeon: Ruby Corporal, MD;  [facility];  Service: Endoscopy;  Laterality: N/A;   ESOPHAGOGASTRODUODENOSCOPY (EGD) WITH PROPOFOL N/A 10/17/2018   Procedure: ESOPHAGOGASTRODUODENOSCOPY (EGD) WITH PROPOFOL;  Surgeon: Ruby Corporal, MD;  [facility];  Service: Endoscopy;  Laterality: N/A;  2:25   ESOPHAGOGASTRODUODENOSCOPY (EGD) WITH PROPOFOL N/A 03/03/2021   Procedure: ESOPHAGOGASTRODUODENOSCOPY (EGD) WITH PROPOFOL;  Surgeon: Ruby Corporal, MD;  [facility];  Service: Endoscopy;  Laterality: N/A;  12:15  ESOPHAGOGASTRODUODENOSCOPY (EGD) WITH PROPOFOL N/A 09/11/2023   Procedure: ESOPHAGOGASTRODUODENOSCOPY (EGD) WITH PROPOFOL;  Surgeon: Suzette Espy, MD;  [facility];  Service: Endoscopy;  Laterality: N/A;  2:30 pm, asa 3, LM to see if pt can come earlier   [facility] DILATION N/A 09/11/2023   Procedure: Londa Rival DILATION;  Surgeon: Suzette Espy, MD;  [facility];  Service: Endoscopy;  Laterality:  N/A;   ORIF ANKLE FRACTURE Left 03/10/2023   Procedure: OPEN REDUCTION INTERNAL FIXATION (ORIF) ANKLE FRACTURE;  Surgeon: Diedra Fowler, MD;  Location: [facility];  Service: Orthopedics;  Laterality: Left;   POLYPECTOMY  01/03/2022   Procedure: POLYPECTOMY INTESTINAL;  Surgeon: Ruby Corporal, MD;  [facility];  Service: Endoscopy;;   TUBAL LIGATION       reports that she has never smoked. She has never been exposed to tobacco smoke. She has never used smokeless tobacco. She reports that she does not drink alcohol and does not use drugs.  Allergies  Allergen Reactions   Bee Venom Anaphylaxis   Contrast Media [Iodinated Contrast Media]     CT chest w/ contrast at OSH followed by SOB resolved w/ single dose steroids, never ENT swelling, intubation or pressors, has had multiple contrasted scans since   Bactrim [Sulfamethoxazole-Trimethoprim] Nausea And Vomiting   Codeine Itching    Other reaction(s): ITCHING    Family History  Problem Relation Age of Onset   Cardiomyopathy Mother    Breast cancer Mother    Prostate cancer Father    Heart attack Maternal Grandmother    Heart attack Maternal Grandfather    Heart attack Paternal Grandmother    Bipolar disorder Daughter    Heart disease Maternal Aunt    Neurofibromatosis Cousin    Colon cancer Neg Hx    Prior to Admission medications   Medication Sig Start Date End Date Taking? Authorizing Provider  albuterol (PROVENTIL) (2.5 MG/3ML) 0.083% nebulizer solution Take 3 mLs (2.5 mg total) by nebulization every 4 (four) hours as needed for wheezing or shortness of breath. 12/15/22 12/15/23 Yes Payson Evrard, Courage, MD  albuterol (VENTOLIN HFA) 108 (90 Base) MCG/ACT inhaler Inhale 2 puffs into the lungs every 6 (six) hours as needed for wheezing or shortness of breath. 12/15/22  Yes Archer Vise, Courage, MD  budesonide (PULMICORT) 0.5 MG/2ML nebulizer solution Inhale the contents of 1 vial via nebulizer two (2) times a day. 11/22/21  Yes    busPIRone (BUSPAR) 15 MG tablet Take 1 tablet (15 mg) by mouth 3 times daily. 10/11/23  Yes Bennet Brasil, MD  calcitonin, salmon, (MIACALCIN) 200 UNIT/ACT nasal spray Use 1 spray in one nostril per day, alternate nostrils every other day (may have 1 vial she should use this for at least 2 weeks but may use as long as 4 weeks) 03/01/23  Yes Luking, Jackelyn Marvel, MD  DULoxetine (CYMBALTA) 60 MG capsule Take 1 capsule (60 mg total) by mouth daily. 09/02/23  Yes Bennet Brasil, MD  ferrous sulfate 325 (65 FE) MG EC tablet Take 325 mg by mouth once a week.   Yes [provider]  furosemide (LASIX) 20 MG tablet Take 1 tablet (20 mg total) by mouth every Monday, Wednesday, and Friday. 07/17/23  Yes Luking, Jackelyn Marvel, MD  hydrocortisone (CORTEF) 10 MG tablet Take 2 tablets by mouth at 8:00 am and 1 tablet at lunch. (May double dose on sick days x 3-5 days) 11/14/23  Yes Nida, Gebreselassie W, MD  hydrOXYzine (ATARAX)  25 MG tablet Take 1 tablet (25 mg total) by mouth 3 (three) times daily as needed for anxiety. 12/02/23 12/01/24 Yes Luking, Jackelyn Marvel, MD  ipratropium-albuterol (DUONEB) 0.5-2.5 (3) MG/3ML SOLN Take 3 mLs by nebulization every 8 (eight) hours. 12/17/22  Yes   levothyroxine (SYNTHROID) 50 MCG tablet Take 1 tablet by mouth daily before breakfast. 11/22/23 05/20/24 Yes Nida, Gebreselassie W, MD  lidocaine (LIDODERM) 5 % Place 1 patch onto the skin daily. Remove & Discard patch within 12 hours or as directed by MD 04/09/23  Yes Mason Sole, Pratik D, DO  linaclotide (LINZESS) 290 MCG CAPS capsule Take 1 capsule (290 mcg total) by mouth daily before breakfast. 03/28/22  Yes Luking, Scott A, MD  methylPREDNISolone sodium succinate (SOLU-MEDROL, PF,) 125 MG injection ACT-O-VIAL Administer as directed.  Giving IV is preferred, but can also be given IM if IV access is difficult. 11/26/23  Yes Nida, Gebreselassie W, MD  midodrine (PROAMATINE) 5 MG tablet Take 1 tablet (5 mg total) by mouth 2 (two) times daily with a meal.  06/19/23 06/18/24 Yes Luking, Jackelyn Marvel, MD  naloxone [facility]) nasal spray 4 mg/0.1 mL Use as directed for opioid overdose 12/26/22  Yes Luking, Jackelyn Marvel, MD  naloxone [facility]) nasal spray 4 mg/0.1 mL Place 1 spray into the nose as needed. 11/11/23  Yes   OXYGEN Inhale 5 L into the lungs continuous.   Yes [provider]  EPINEPHrine 0.3 mg/0.3 mL IJ SOAJ injection Inject 0.3 mg into the muscle as needed for anaphylaxis. 01/05/22   Bennet Brasil, MD  ondansetron (ZOFRAN-ODT) 8 MG disintegrating tablet Dissolve 1 tablet (8 mg total) by mouth every 8 (eight) hours as needed. 04/23/23 04/22/24  Bennet Brasil, MD  oxyCODONE-acetaminophen (PERCOCET) 10-325 MG tablet Take 1 tablet by mouth every 4 (four) hours as needed for pain. Max 6 per day 09/11/23   Bennet Brasil, MD  oxyCODONE-acetaminophen (PERCOCET) 10-325 MG tablet Take 1 tablet by mouth every 4 (four) hours as needed for pain. Max 6 /day 12/06/23   Bennet Brasil, MD  pantoprazole (PROTONIX) 40 MG tablet Take 1 tablet (40 mg total) by mouth 2 (two) times daily before a meal. 08/13/23 08/12/24  Bennet Brasil, MD  predniSONE (DELTASONE) 5 MG tablet Take 1 tablet (5 mg total) by mouth in the morning. 08/01/23     prochlorperazine (COMPAZINE) 25 MG suppository Place 1 suppository (25 mg total) rectally every 12 (twelve) hours as needed for nausea or vomiting. 06/02/23   Cardama, Camila Cecil, MD  prochlorperazine (COMPAZINE) 5 MG tablet Take 1 tablet (5 mg total) by mouth 2 (two) times daily as needed for nausea or vomiting. 07/16/23   Bennet Brasil, MD  rosuvastatin (CRESTOR) 20 MG tablet Take 1 tablet (20 mg total) by mouth daily. 08/12/23   Bennet Brasil, MD  Semaglutide-Weight Management (WEGOVY) 0.25 MG/0.5ML SOAJ Inject 0.25 mg into the skin once a week. 10/28/23   Nida, Gebreselassie W, MD  senna-docusate (SENOKOT-S) 8.6-50 MG tablet Take 1 tablet by mouth 2 (two) times daily between meals as needed for mild constipation. 03/13/23    Theadore Finger, MD    Physical Exam: Vitals:   12/07/23 1815 12/07/23 1830 12/07/23 1845 12/07/23 1900  BP: (!) 128/56 135/62 (!) 133/53 (!) 135/47  Pulse: 81 84 84 84  Resp:      Temp:      TempSrc:      SpO2: 99% 100% 98% 100%  Weight:  Height:        Constitutional: Uncomfortable 2/2 back pain,  Vitals:   12/07/23 1815 12/07/23 1830 12/07/23 1845 12/07/23 1900  BP: (!) 128/56 135/62 (!) 133/53 (!) 135/47  Pulse: 81 84 84 84  Resp:      Temp:      TempSrc:      SpO2: 99% 100% 98% 100%  Weight:      Height:       Eyes: PERRL, lids and conjunctivae normal ENMT: Mucous membranes are moist.  Neck: normal, supple, no masses, no thyromegaly Respiratory: Some mild audible wheezing while standing at bedside-likely from upper airway, as wheezing on long auscultation, Normal respiratory effort. No accessory muscle use.  Cardiovascular: Regular rate and rhythm, no murmurs / rubs / gallops.  1+ pitting bilateral lower extremity edema.  Extremities warm Abdomen: Obese, no tenderness, no masses palpated. No hepatosplenomegaly.  Musculoskeletal: no clubbing / cyanosis. No joint deformity upper and lower extremities.  Skin: no rashes, lesions, ulcers. No induration Neurologic: No facial asymmetry, moving extremities spontaneously, speech fluent. Psychiatric: Normal judgment and insight. Alert and oriented x 3. Normal mood.   Labs on Admission: I have personally reviewed following labs and imaging studies  CBC: Recent Labs  Lab 12/07/23 1344  WBC 9.2  HGB 9.7*  HCT 32.8*  MCV 97.3  PLT 393   Basic Metabolic Panel: Recent Labs  Lab 12/07/23 1344  NA 138  K 3.5  CL 97*  CO2 32  GLUCOSE 115*  BUN 13  CREATININE 0.73  CALCIUM 8.4*   Urine analysis:    Component Value Date/Time   COLORURINE YELLOW 12/07/2023 1327   APPEARANCEUR CLEAR 12/07/2023 1327   LABSPEC 1.012 12/07/2023 1327   PHURINE 5.0 12/07/2023 1327   GLUCOSEU NEGATIVE 12/07/2023 1327   GLUCOSEU  NEGATIVE 05/29/2010 1622   HGBUR NEGATIVE 12/07/2023 1327   BILIRUBINUR NEGATIVE 12/07/2023 1327   BILIRUBINUR + 11/08/2020 1604   KETONESUR NEGATIVE 12/07/2023 1327   PROTEINUR NEGATIVE 12/07/2023 1327   UROBILINOGEN 1.0 04/30/2020 0831   UROBILINOGEN 0.2 06/16/2014 0942   NITRITE NEGATIVE 12/07/2023 1327   LEUKOCYTESUR NEGATIVE 12/07/2023 1327    Radiological Exams on Admission: MR Lumbar Spine W Wo Contrast Result Date: 12/07/2023 CLINICAL DATA:  Low back pain, cauda equina syndrome suspected. Incontinence. EXAM: MRI LUMBAR SPINE WITHOUT AND WITH CONTRAST TECHNIQUE: Multiplanar and multiecho pulse sequences of the lumbar spine were obtained without and with intravenous contrast. CONTRAST:  10mL GADAVIST GADOBUTROL 1 MMOL/ML IV SOLN COMPARISON:  MRI lumbar spine 01/16/2021. CT abdomen and pelvis 08/05/2023. FINDINGS: Segmentation: Mildly transitional L5 with left-sided L5-S1 assimilation joint. Alignment:  Normal. Vertebrae: New L4 superior endplate compression fracture with 20% vertebral body height loss and moderate marrow edema. No significant retropulsion. No suspicious marrow lesion. Conus medullaris and cauda equina: Conus extends to the L1 level. Conus and cauda equina appear normal. Paraspinal and other soft tissues: Unremarkable. Disc levels: T12-L1 through L2-3: Disc desiccation. No disc herniation or stenosis. L3-4: Disc desiccation. Minimal disc bulging and mild facet hypertrophy without stenosis, unchanged from the prior MRI. L4-5: Disc desiccation. Minimal disc bulging, stable to slightly increased. Mild facet hypertrophy. No stenosis. L5-S1: Transitional anatomy. Effacement of the thecal sac by epidural fat. No disc herniation or stenosis. IMPRESSION: 1. Acute/subacute L4 compression fracture. 2. Mild lumbar spondylosis without stenosis. Electronically Signed   By: Aundra Lee M.D.   On: 12/07/2023 18:10   EKG: None.   Assessment/Plan Principal Problem:   Intractable back  pain  Active Problems:   Bilateral lower extremity edema   Interstitial lung disease (HCC)   Chronic respiratory failure with hypoxia (HCC)   Anxiety and depression   Sarcoidosis   Hypotension   Obstructive sleep apnea   Chronic pain syndrome   Adrenal insufficiency (Addison's disease) (HCC)   Assessment and Plan: * Intractable back pain No falls no trauma.  Patient on chronic steroids.  MRI shows acute/subacute L4 compression fracture.  Admitted for pain control.  Urinary retention likely from uncontrolled pain. -EDP talked to neurosurgery, recommended LSO brace, does not warrant neurosurgical intervention. -IV Dilaudid 1 mg every 4 hourly as needed -Foley catheter for now till able to ambulate and pain is controlled -PT/OT evaluation  Chronic respiratory failure with hypoxia (HCC) On 5 L at baseline- Sats currently 94 to 100%  Bilateral lower extremity edema She is a Lasix 20 mg 3 times a week.  1+ pitting bilateral lower extremity edema, mildly worse than baseline.  Weights per chart unchanged.  Last echo 2018 EF 55 to 60%. - IV Lasix 20 mg x 1, continue home Lasix  Anxiety and depression Appears anxious likely from uncontrolled pain. -Resume Cymbalta, hydroxyzine, buspirone  Adrenal insufficiency (Addison's disease) (HCC) - Resume Solu-Cortef and midodrine, also on prednisone, will resume. - Doubt need for stress dose steroids at this time  Obstructive sleep apnea CPAP nightly  Hypotension Diastolic on the low side otherwise stable. -Resume midodrine   DVT prophylaxis: Lovenox Code Status: FULL Family Communication: Spouse at bedside Disposition Plan: ~ 1- 2 days Consults called: None Admission status: Obs Med surg   Author: Pati Bonine, MD 12/07/2023 8:48 PM  For on call review [email or web address].

## 2023-12-07 NOTE — Assessment & Plan Note (Addendum)
-   Resume Solu-Cortef and midodrine, also on prednisone, will resume. - Doubt need for stress dose steroids at this time

## 2023-12-07 NOTE — ED Notes (Signed)
 PA made aware of patient request for pain medication for Back Pain 10/10.

## 2023-12-07 NOTE — ED Notes (Signed)
 Pt had over 584 mL of urine in bladder, verbal order to in and out by EDP

## 2023-12-07 NOTE — ED Provider Notes (Signed)
  Physical Exam  BP (!) 135/47   Pulse 84   Temp 97.7 F (36.5 C) (Oral)   Resp 17   Ht 5\' 10"  (1.778 m)   Wt 108.9 kg   LMP 05/17/2016   SpO2 100%   BMI 34.44 kg/m   Physical Exam  Procedures  Procedures  ED Course / MDM    Medical Decision Making Amount and/or Complexity of Data Reviewed Labs: ordered. Radiology: ordered.  Risk Prescription drug management. Decision regarding hospitalization.   Patient was signed out to myself at shift change by Dr. Annabell Key.  Patient continues to have ongoing intractable pain at this point despite multiple rounds of pain medication.  She also continues to have urinary retention.  Did touch base with Megan with neurosurgery who noted the patient does not warrant any neurosurgical intervention and does not believe that the urinary retention is coming from the compression fracture.  Patient continues to be unable to move in bed and cannot ambulate would not be to care for self at home.  Will plan for admission to the hospital service at this time for further pain control as well as PT and OT.  Did discuss patient case with Dr. Debroah Fanning who has excepted at this time.       Roselynn Connors, PA-C 12/07/23 1931    Early Glisson, MD 12/09/23 [phone]

## 2023-12-07 NOTE — Progress Notes (Signed)
 Pt declined CPAP stating she had a sleep study in 2020 but has never used a CPAP before. Pt on [place] tolerating well at this time

## 2023-12-07 NOTE — ED Notes (Signed)
 Placed Call to Ortho tech Lauren informed this nurse that when we have a ORDER for Discharge or Admission to call back, so they know if the Brace would or would not need to be STAT. PA made aware.

## 2023-12-07 NOTE — Assessment & Plan Note (Addendum)
 Appears anxious likely from uncontrolled pain. -Resume Cymbalta, hydroxyzine, buspirone

## 2023-12-07 NOTE — Assessment & Plan Note (Signed)
 On 5 L at baseline- Sats currently 94 to 100%

## 2023-12-07 NOTE — ED Notes (Signed)
 Per Manny, ortho tech, because patient is being admitted, the LSO brace will be delivered in the morning.

## 2023-12-07 NOTE — Assessment & Plan Note (Signed)
 CPAP nightly

## 2023-12-07 NOTE — ED Provider Notes (Signed)
 [place] EMERGENCY DEPARTMENT AT [facility] Provider Note   CSN: [id number] Arrival date & time: 12/07/23  1256     History {Add pertinent medical, surgical, social history, OB history to HPI:1} Chief Complaint  Patient presents with   Back Pain    Kathleen [name] is a 63 y.o. female.   Back Pain  63 year old female, history of morbid obesity, history of pulmonary sarcoidosis on 5 L of oxygen by nasal cannula chronically.  She has a history of frequent pain and has been prescribed Percocet several times this year already, in fact she gets 180 tablets of 10 mg Percocet every month and last filled it in the middle of March.  She has no prior history of back surgery however she developed acute onset of back pain several days ago when she stood up to go walk and felt a pop in her back followed by pain located in the right and left lower back but right more than left, much worse when she tries to move, she comes in today by ambulance secondary to the back pain that is persistent, she had told the paramedics that it actually started in December but seem to got worse over the last few days.  She tells me that she is unable to move from side-to-side on her back because of the pain but denies any numbness or weakness in the legs, denies urinary symptoms, denies fevers or chills, denies nausea or vomiting.  She reports that today she has already eaten a sandwich and some juice    Home Medications Prior to Admission medications   Medication Sig Start Date End Date Taking? Authorizing Provider  albuterol (PROVENTIL) (2.5 MG/3ML) 0.083% nebulizer solution Take 3 mLs (2.5 mg total) by nebulization every 4 (four) hours as needed for wheezing or shortness of breath. 12/15/22 12/15/23 Yes Emokpae, Courage, MD  albuterol (VENTOLIN HFA) 108 (90 Base) MCG/ACT inhaler Inhale 2 puffs into the lungs every 6 (six) hours as needed for wheezing or shortness of breath. 12/15/22  Yes Emokpae, Courage, MD   budesonide (PULMICORT) 0.5 MG/2ML nebulizer solution Inhale the contents of 1 vial via nebulizer two (2) times a day. 11/22/21  Yes   busPIRone (BUSPAR) 15 MG tablet Take 1 tablet (15 mg) by mouth 3 times daily. 10/11/23  Yes [name], MD  calcitonin, salmon, (MIACALCIN) 200 UNIT/ACT nasal spray Use 1 spray in one nostril per day, alternate nostrils every other day (may have 1 vial she should use this for at least 2 weeks but may use as long as 4 weeks) 03/01/23  Yes Luking, Jonna Coup, MD  DULoxetine (CYMBALTA) 60 MG capsule Take 1 capsule (60 mg total) by mouth daily. 09/02/23  Yes [name], MD  ferrous sulfate 325 (65 FE) MG EC tablet Take 325 mg by mouth once a week.   Yes [provider]  furosemide (LASIX) 20 MG tablet Take 1 tablet (20 mg total) by mouth every Monday, Wednesday, and Friday. 07/17/23  Yes Luking, Jonna Coup, MD  hydrocortisone (CORTEF) 10 MG tablet Take 2 tablets by mouth at 8:00 am and 1 tablet at lunch. (May double dose on sick days x 3-5 days) 11/14/23  Yes Nida, Denman George, MD  hydrOXYzine (ATARAX) 25 MG tablet Take 1 tablet (25 mg total) by mouth 3 (three) times daily as needed for anxiety. 12/02/23 12/01/24 Yes Luking, Jonna Coup, MD  ipratropium-albuterol (DUONEB) 0.5-2.5 (3) MG/3ML SOLN Take 3 mLs by nebulization every 8 (eight) hours.  12/17/22  Yes   levothyroxine (SYNTHROID) 50 MCG tablet Take 1 tablet by mouth daily before breakfast. 11/22/23 05/20/24 Yes Nida, Gebreselassie W, MD  lidocaine (LIDODERM) 5 % Place 1 patch onto the skin daily. Remove & Discard patch within 12 hours or as directed by MD 04/09/23  Yes Mason Sole, Pratik D, DO  linaclotide (LINZESS) 290 MCG CAPS capsule Take 1 capsule (290 mcg total) by mouth daily before breakfast. 03/28/22  Yes Luking, Scott A, MD  methylPREDNISolone sodium succinate (SOLU-MEDROL, PF,) 125 MG injection ACT-O-VIAL Administer as directed.  Giving IV is preferred, but can also be given IM if IV access is difficult. 11/26/23  Yes  Nida, Gebreselassie W, MD  midodrine (PROAMATINE) 5 MG tablet Take 1 tablet (5 mg total) by mouth 2 (two) times daily with a meal. 06/19/23 06/18/24 Yes Luking, Jackelyn Marvel, MD  naloxone [facility]) nasal spray 4 mg/0.1 mL Use as directed for opioid overdose 12/26/22  Yes Luking, Jackelyn Marvel, MD  naloxone [facility]) nasal spray 4 mg/0.1 mL Place 1 spray into the nose as needed. 11/11/23  Yes   OXYGEN Inhale 5 L into the lungs continuous.   Yes [provider]  EPINEPHrine 0.3 mg/0.3 mL IJ SOAJ injection Inject 0.3 mg into the muscle as needed for anaphylaxis. 01/05/22   Bennet Brasil, MD  ondansetron (ZOFRAN-ODT) 8 MG disintegrating tablet Dissolve 1 tablet (8 mg total) by mouth every 8 (eight) hours as needed. 04/23/23 04/22/24  Bennet Brasil, MD  oxyCODONE-acetaminophen (PERCOCET) 10-325 MG tablet Take 1 tablet by mouth every 4 (four) hours as needed for pain. Max 6 per day 09/11/23   Bennet Brasil, MD  oxyCODONE-acetaminophen (PERCOCET) 10-325 MG tablet Take 1 tablet by mouth every 4 (four) hours as needed for pain. Max 6 /day 12/06/23   Bennet Brasil, MD  pantoprazole (PROTONIX) 40 MG tablet Take 1 tablet (40 mg total) by mouth 2 (two) times daily before a meal. 08/13/23 08/12/24  Bennet Brasil, MD  predniSONE (DELTASONE) 5 MG tablet Take 1 tablet (5 mg total) by mouth in the morning. 08/01/23     prochlorperazine (COMPAZINE) 25 MG suppository Place 1 suppository (25 mg total) rectally every 12 (twelve) hours as needed for nausea or vomiting. 06/02/23   Cardama, Camila Cecil, MD  prochlorperazine (COMPAZINE) 5 MG tablet Take 1 tablet (5 mg total) by mouth 2 (two) times daily as needed for nausea or vomiting. 07/16/23   Bennet Brasil, MD  rosuvastatin (CRESTOR) 20 MG tablet Take 1 tablet (20 mg total) by mouth daily. 08/12/23   Bennet Brasil, MD  Semaglutide-Weight Management (WEGOVY) 0.25 MG/0.5ML SOAJ Inject 0.25 mg into the skin once a week. 10/28/23   Nida, Gebreselassie W, MD  senna-docusate  (SENOKOT-S) 8.6-50 MG tablet Take 1 tablet by mouth 2 (two) times daily between meals as needed for mild constipation. 03/13/23   Gonfa, Taye T, MD      Allergies    Bee venom, Contrast media [iodinated contrast media], Bactrim [sulfamethoxazole-trimethoprim], and Codeine    Review of Systems   Review of Systems  Musculoskeletal:  Positive for back pain.  All other systems reviewed and are negative.   Physical Exam Updated Vital Signs BP (!) 102/41 (BP Location: Right Arm)   Pulse 82   Temp 98.1 F (36.7 C)   Resp 18   Ht 1.778 m (5\' 10" )   Wt 108.9 kg   LMP 05/17/2016   SpO2 100%   BMI 34.44 kg/m  Physical Exam Vitals and nursing note reviewed.  Constitutional:      General: She is not in acute distress.    Appearance: She is well-developed.     Comments: Uncomfortable appearing laying in the left lateral decubitus position  HENT:     Head: Normocephalic and atraumatic.     Mouth/Throat:     Mouth: Mucous membranes are dry.     Pharynx: No oropharyngeal exudate.  Eyes:     General: No scleral icterus.       Right eye: No discharge.        Left eye: No discharge.     Conjunctiva/sclera: Conjunctivae normal.     Pupils: Pupils are equal, round, and reactive to light.  Neck:     Thyroid: No thyromegaly.     Vascular: No JVD.  Cardiovascular:     Rate and Rhythm: Normal rate and regular rhythm.     Heart sounds: Normal heart sounds. No murmur heard.    No friction rub. No gallop.  Pulmonary:     Effort: Pulmonary effort is normal. No respiratory distress.     Breath sounds: Normal breath sounds. No wheezing or rales.     Comments: On 5 L by nasal cannula at 95 to 100%, no increased work of breathing Abdominal:     General: Bowel sounds are normal. There is no distension.     Palpations: Abdomen is soft. There is no mass.     Tenderness: There is no abdominal tenderness.     Comments: Very obese but nontender abdomen  Musculoskeletal:        General: Tenderness  present. Normal range of motion.     Cervical back: Normal range of motion and neck supple.     Right lower leg: No edema.     Left lower leg: No edema.     Comments: There is tenderness to palpation across the back with even light touch, there is tenderness going into the right buttock, no tenderness in the legs, bilateral legs examined without edema or asymmetry, she can extend and flex at the hips without significant pain, when she tries to roll back-and-forth across the bed the pain is worsened  Lymphadenopathy:     Cervical: No cervical adenopathy.  Skin:    General: Skin is warm and dry.     Findings: No erythema or rash.  Neurological:     Mental Status: She is alert.     Coordination: Coordination normal.  Psychiatric:        Behavior: Behavior normal.     ED Results / Procedures / Treatments   Labs (all labs ordered are listed, but only abnormal results are displayed) Labs Reviewed - No data to display  EKG None  Radiology No results found.  Procedures Procedures  {Document cardiac monitor, telemetry assessment procedure when appropriate:1}  Medications Ordered in ED Medications - No data to display  ED Course/ Medical Decision Making/ A&P   {   Click here for ABCD2, HEART and other calculatorsREFRESH Note before signing :1}                              Medical Decision Making  ***  {Document critical care time when appropriate:1} {Document review of labs and clinical decision tools ie heart score, Chads2Vasc2 etc:1}  {Document your independent review of radiology images, and any outside records:1} {Document your discussion with family members, caretakers, and with consultants:1} {Document  social determinants of health affecting pt's care:1} {Document your decision making why or why not admission, treatments were needed:1} Final Clinical Impression(s) / ED Diagnoses Final diagnoses:  None    Rx / DC Orders ED Discharge Orders     None

## 2023-12-07 NOTE — ED Notes (Signed)
 Patient transported to CT

## 2023-12-07 NOTE — Assessment & Plan Note (Signed)
 Diastolic on the low side otherwise stable. -Resume midodrine

## 2023-12-07 NOTE — ED Notes (Signed)
 Pearletha Bouche PA, Made aware of patient request for a meal.

## 2023-12-07 NOTE — Assessment & Plan Note (Addendum)
 She is a Lasix 20 mg 3 times a week.  1+ pitting bilateral lower extremity edema, mildly worse than baseline.  Weights per chart unchanged.  Last echo 2018 EF 55 to 60%. - IV Lasix 20 mg x 1, continue home Lasix

## 2023-12-07 NOTE — ED Notes (Signed)
 ED TO INPATIENT HANDOFF REPORT  ED Nurse Name and Phone #: Milda Aline [phone]  S Name/Age/Gender Kathleen [name] 63 y.o. female Room/Bed: APA04/APA04  Code Status   Code Status: Prior  Home/SNF/Other Home Patient oriented to: self, place, time, and situation Is this baseline? Yes   Triage Complete: Triage complete  Chief Complaint Intractable back pain [M54.9]  Triage Note Pt BIB [facility] due to worsening back pain that started in Dec. Pt stated it has gotten worse in the last couple of days. Pt is unable to turn onto back due to pain   Allergies Allergies  Allergen Reactions   Bee Venom Anaphylaxis   Contrast Media [Iodinated Contrast Media]     CT chest w/ contrast at OSH followed by SOB resolved w/ single dose steroids, never ENT swelling, intubation or pressors, has had multiple contrasted scans since   Bactrim [Sulfamethoxazole-Trimethoprim] Nausea And Vomiting   Codeine Itching    Other reaction(s): ITCHING    Level of Care/Admitting Diagnosis ED Disposition     ED Disposition  Admit   Condition  --   Comment  Hospital Area: [facility] [[id number]]  Level of Care: Med-Surg [16]  Covid Evaluation: Asymptomatic - no recent exposure (last 10 days) testing not required  Diagnosis: Intractable back pain [720110]  Admitting Physician: EMOKPAE, EJIROGHENE E [[id number]]  Attending Physician: EMOKPAE, EJIROGHENE E Evelynn.Filler          B Medical/Surgery History Past Medical History:  Diagnosis Date   Adrenal insufficiency (HCC)    Anemia    Anxiety    Avascular bone necrosis (HCC)    Breast fibrocystic disorder    Chronic pain    Encounter for long-term opiate analgesic use 02/02/2020   Patient has chronic pain with femoral head avascular necrosis.  Is under pain contract   Fibromyalgia    Gastroesophageal reflux disease    Hypothyroidism    Mitral valve prolapse    Orthostatic hypotension    Peripheral neuropathy    PONV (postoperative nausea and vomiting)     Restrictive lung disease    Moderate to severe   Sarcoidosis    Biopsy proven - [facility]   Sarcoidosis    SOB (shortness of breath)    chronic   Past Surgical History:  Procedure Laterality Date   BIOPSY  10/17/2018   Procedure: BIOPSY;  Surgeon: Ruby Corporal, MD;  [facility]: [facility];  Service: Endoscopy;;  duodenum, antrum, gastric body   BIOPSY  03/03/2021   Procedure: BIOPSY;  Surgeon: Ruby Corporal, MD;  [facility]: [facility];  Service: Endoscopy;;   BIOPSY  09/11/2023   Procedure: BIOPSY;  Surgeon: Suzette Espy, MD;  Location: [facility];  Service: Endoscopy;;   BREAST LUMPECTOMY Bilateral 01/12/2011   CHOLECYSTECTOMY N/A 04/17/2021   Procedure: LAPAROSCOPIC CHOLECYSTECTOMY;  Surgeon: Alanda Allegra, MD;  Location: [facility];  Service: General;  Laterality: N/A;   COLONOSCOPY WITH PROPOFOL N/A 01/03/2022   Procedure: COLONOSCOPY WITH PROPOFOL;  Surgeon: Ruby Corporal, MD;  Location: [facility];  Service: Endoscopy;  Laterality: N/A;  220   ESOPHAGEAL DILATION N/A 03/03/2021   Procedure: ESOPHAGEAL DILATION;  Surgeon: Ruby Corporal, MD;  [facility];  Service: Endoscopy;  Laterality: N/A;   ESOPHAGOGASTRODUODENOSCOPY (EGD) WITH PROPOFOL N/A 10/17/2018   Procedure: ESOPHAGOGASTRODUODENOSCOPY (EGD) WITH PROPOFOL;  Surgeon: Ruby Corporal, MD;  [facility];  Service: Endoscopy;  Laterality: N/A;  2:25   ESOPHAGOGASTRODUODENOSCOPY (EGD) WITH PROPOFOL N/A  03/03/2021   Procedure: ESOPHAGOGASTRODUODENOSCOPY (EGD) WITH PROPOFOL;  Surgeon: Ruby Corporal, MD;  [facility];  Service: Endoscopy;  Laterality: N/A;  12:15   ESOPHAGOGASTRODUODENOSCOPY (EGD) WITH PROPOFOL N/A 09/11/2023   Procedure: ESOPHAGOGASTRODUODENOSCOPY (EGD) WITH PROPOFOL;  Surgeon: Suzette Espy, MD;  [facility];  Service: Endoscopy;  Laterality: N/A;  2:30 pm, asa 3, LM to see if pt can come earlier   [facility] DILATION N/A 09/11/2023   Procedure:  Londa Rival DILATION;  Surgeon: Suzette Espy, MD;  [facility];  Service: Endoscopy;  Laterality: N/A;   ORIF ANKLE FRACTURE Left 03/10/2023   Procedure: OPEN REDUCTION INTERNAL FIXATION (ORIF) ANKLE FRACTURE;  Surgeon: Diedra Fowler, MD;  Location: [facility];  Service: Orthopedics;  Laterality: Left;   POLYPECTOMY  01/03/2022   Procedure: POLYPECTOMY INTESTINAL;  Surgeon: Ruby Corporal, MD;  [facility];  Service: Endoscopy;;   TUBAL LIGATION       A IV Location/Drains/Wounds Patient Lines/Drains/Airways Status     Active Line/Drains/Airways     Name Placement date Placement time Site Days   Peripheral IV 12/07/23 20 G 1" Right Antecubital 12/07/23  1340  Antecubital  less than 1   Urethral Catheter Angie Clinton Latex 14 Fr. 12/07/23  2028  Latex  less than 1            Intake/Output Last 24 hours  Intake/Output Summary (Last 24 hours) at 12/07/2023 2031 Last data filed at 12/07/2023 1719 Gross per 24 hour  Intake --  Output 2250 ml  Net -2250 ml    Labs/Imaging Results for orders placed or performed during the hospital encounter of 12/07/23 (from the past 48 hours)  Urinalysis, Routine w reflex microscopic -Urine, Catheterized     Status: None   Collection Time: 12/07/23  1:27 PM  Result Value Ref Range   Color, Urine YELLOW YELLOW   APPearance CLEAR CLEAR   Specific Gravity, Urine 1.012 1.005 - 1.030   pH 5.0 5.0 - 8.0   Glucose, UA NEGATIVE NEGATIVE mg/dL   Hgb urine dipstick NEGATIVE NEGATIVE   Bilirubin Urine NEGATIVE NEGATIVE   Ketones, ur NEGATIVE NEGATIVE mg/dL   Protein, ur NEGATIVE NEGATIVE mg/dL   Nitrite NEGATIVE NEGATIVE   Leukocytes,Ua NEGATIVE NEGATIVE    Comment: Performed at [facility], [address] [place], [place] [address]  Basic metabolic panel     Status: Abnormal   Collection Time: 12/07/23  1:44 PM  Result Value Ref Range   Sodium 138 135 - 145 mmol/L   Potassium 3.5 3.5 - 5.1 mmol/L   Chloride 97 (L) 98 - 111  mmol/L   CO2 32 22 - 32 mmol/L   Glucose, Bld 115 (H) 70 - 99 mg/dL    Comment: Glucose reference range applies only to samples taken after fasting for at least 8 hours.   BUN 13 8 - 23 mg/dL   Creatinine, Ser 6.04 0.44 - 1.00 mg/dL   Calcium 8.4 (L) 8.9 - 10.3 mg/dL   GFR, Estimated >54 >09 mL/min    Comment: (NOTE) Calculated using the CKD-EPI Creatinine Equation (2021)    Anion gap 9 5 - 15    Comment: Performed at [facility], [address] [place], [place] [address]  CBC     Status: Abnormal   Collection Time: 12/07/23  1:44 PM  Result Value Ref Range   WBC 9.2 4.0 - 10.5 K/uL   RBC 3.37 (L) 3.87 - 5.11 MIL/uL  Hemoglobin 9.7 (L) 12.0 - 15.0 g/dL   HCT 16.1 (L) 09.6 - 04.5 %   MCV 97.3 80.0 - 100.0 fL   MCH 28.8 26.0 - 34.0 pg   MCHC 29.6 (L) 30.0 - 36.0 g/dL   RDW 40.9 81.1 - 91.4 %   Platelets 393 150 - 400 K/uL   nRBC 0.5 (H) 0.0 - 0.2 %    Comment: Performed at [facility], [address] [place], [place] [address]   *Note: Due to a large number of results and/or encounters for the requested time period, some results have not been displayed. A complete set of results can be found in Results Review.   MR Lumbar Spine W Wo Contrast Result Date: 12/07/2023 CLINICAL DATA:  Low back pain, cauda equina syndrome suspected. Incontinence. EXAM: MRI LUMBAR SPINE WITHOUT AND WITH CONTRAST TECHNIQUE: Multiplanar and multiecho pulse sequences of the lumbar spine were obtained without and with intravenous contrast. CONTRAST:  10mL GADAVIST GADOBUTROL 1 MMOL/ML IV SOLN COMPARISON:  MRI lumbar spine 01/16/2021. CT abdomen and pelvis 08/05/2023. FINDINGS: Segmentation: Mildly transitional L5 with left-sided L5-S1 assimilation joint. Alignment:  Normal. Vertebrae: New L4 superior endplate compression fracture with 20% vertebral body height loss and moderate marrow edema. No significant retropulsion. No suspicious marrow lesion. Conus medullaris and cauda equina: Conus extends to the L1  level. Conus and cauda equina appear normal. Paraspinal and other soft tissues: Unremarkable. Disc levels: T12-L1 through L2-3: Disc desiccation. No disc herniation or stenosis. L3-4: Disc desiccation. Minimal disc bulging and mild facet hypertrophy without stenosis, unchanged from the prior MRI. L4-5: Disc desiccation. Minimal disc bulging, stable to slightly increased. Mild facet hypertrophy. No stenosis. L5-S1: Transitional anatomy. Effacement of the thecal sac by epidural fat. No disc herniation or stenosis. IMPRESSION: 1. Acute/subacute L4 compression fracture. 2. Mild lumbar spondylosis without stenosis. Electronically Signed   By: Aundra Lee M.D.   On: 12/07/2023 18:10    Pending Labs Unresulted Labs (From admission, onward)    None       Vitals/Pain Today's Vitals   12/07/23 1830 12/07/23 1845 12/07/23 1900 12/07/23 2024  BP: 135/62 (!) 133/53 (!) 135/47   Pulse: 84 84 84   Resp:      Temp:      TempSrc:      SpO2: 100% 98% 100%   Weight:      Height:      PainSc:    10-Worst pain ever    Isolation Precautions No active isolations  Medications Medications  HYDROmorphone (DILAUDID) injection 1 mg (1 mg Intravenous Given 12/07/23 2024)  hydrocortisone (CORTEF) tablet 10-20 mg (has no administration in time range)  HYDROmorphone (DILAUDID) injection 1 mg (1 mg Intravenous Given 12/07/23 1340)  dexamethasone (DECADRON) injection 10 mg (10 mg Intravenous Given 12/07/23 1341)  methocarbamol (ROBAXIN) tablet 1,000 mg (1,000 mg Oral Given 12/07/23 1340)  gadobutrol (GADAVIST) 1 MMOL/ML injection 10 mL (10 mLs Intravenous Contrast Given 12/07/23 1553)  HYDROmorphone (DILAUDID) injection 0.5 mg (0.5 mg Intravenous Given 12/07/23 1815)    Mobility walks with person assist     Focused Assessments    R Recommendations: See Admitting Provider Note  Report given to:   Additional Notes:

## 2023-12-07 NOTE — Assessment & Plan Note (Addendum)
 No falls no trauma.  Patient on chronic steroids.  MRI shows acute/subacute L4 compression fracture.  Admitted for pain control.  Urinary retention likely from uncontrolled pain. -EDP talked to neurosurgery, recommended LSO brace, does not warrant neurosurgical intervention. -IV Dilaudid 1 mg every 4 hourly as needed -Foley catheter for now till able to ambulate and pain is controlled -PT/OT evaluation

## 2023-12-08 DIAGNOSIS — E271 Primary adrenocortical insufficiency: Secondary | ICD-10-CM | POA: Diagnosis not present

## 2023-12-08 DIAGNOSIS — F32A Depression, unspecified: Secondary | ICD-10-CM | POA: Diagnosis not present

## 2023-12-08 DIAGNOSIS — I739 Peripheral vascular disease, unspecified: Secondary | ICD-10-CM | POA: Diagnosis not present

## 2023-12-08 DIAGNOSIS — Z8249 Family history of ischemic heart disease and other diseases of the circulatory system: Secondary | ICD-10-CM | POA: Diagnosis not present

## 2023-12-08 DIAGNOSIS — R52 Pain, unspecified: Secondary | ICD-10-CM | POA: Diagnosis present

## 2023-12-08 DIAGNOSIS — K219 Gastro-esophageal reflux disease without esophagitis: Secondary | ICD-10-CM | POA: Diagnosis not present

## 2023-12-08 DIAGNOSIS — Z7952 Long term (current) use of systemic steroids: Secondary | ICD-10-CM | POA: Diagnosis not present

## 2023-12-08 DIAGNOSIS — Z6834 Body mass index (BMI) 34.0-34.9, adult: Secondary | ICD-10-CM | POA: Diagnosis not present

## 2023-12-08 DIAGNOSIS — Z9981 Dependence on supplemental oxygen: Secondary | ICD-10-CM | POA: Diagnosis not present

## 2023-12-08 DIAGNOSIS — E039 Hypothyroidism, unspecified: Secondary | ICD-10-CM | POA: Diagnosis not present

## 2023-12-08 DIAGNOSIS — D869 Sarcoidosis, unspecified: Secondary | ICD-10-CM | POA: Diagnosis not present

## 2023-12-08 DIAGNOSIS — S32040A Wedge compression fracture of fourth lumbar vertebra, initial encounter for closed fracture: Secondary | ICD-10-CM | POA: Diagnosis not present

## 2023-12-08 DIAGNOSIS — M797 Fibromyalgia: Secondary | ICD-10-CM | POA: Diagnosis not present

## 2023-12-08 DIAGNOSIS — D84821 Immunodeficiency due to drugs: Secondary | ICD-10-CM | POA: Diagnosis not present

## 2023-12-08 DIAGNOSIS — J9611 Chronic respiratory failure with hypoxia: Secondary | ICD-10-CM | POA: Diagnosis not present

## 2023-12-08 DIAGNOSIS — Z87892 Personal history of anaphylaxis: Secondary | ICD-10-CM | POA: Diagnosis not present

## 2023-12-08 DIAGNOSIS — Z818 Family history of other mental and behavioral disorders: Secondary | ICD-10-CM | POA: Diagnosis not present

## 2023-12-08 DIAGNOSIS — Z882 Allergy status to sulfonamides status: Secondary | ICD-10-CM | POA: Diagnosis not present

## 2023-12-08 DIAGNOSIS — R112 Nausea with vomiting, unspecified: Secondary | ICD-10-CM | POA: Diagnosis not present

## 2023-12-08 DIAGNOSIS — D860 Sarcoidosis of lung: Secondary | ICD-10-CM | POA: Diagnosis not present

## 2023-12-08 DIAGNOSIS — E038 Other specified hypothyroidism: Secondary | ICD-10-CM | POA: Diagnosis not present

## 2023-12-08 DIAGNOSIS — F419 Anxiety disorder, unspecified: Secondary | ICD-10-CM | POA: Diagnosis not present

## 2023-12-08 DIAGNOSIS — J9691 Respiratory failure, unspecified with hypoxia: Secondary | ICD-10-CM | POA: Diagnosis not present

## 2023-12-08 DIAGNOSIS — K5900 Constipation, unspecified: Secondary | ICD-10-CM | POA: Diagnosis not present

## 2023-12-08 DIAGNOSIS — F1120 Opioid dependence, uncomplicated: Secondary | ICD-10-CM | POA: Diagnosis not present

## 2023-12-08 DIAGNOSIS — R600 Localized edema: Secondary | ICD-10-CM | POA: Diagnosis not present

## 2023-12-08 DIAGNOSIS — G4733 Obstructive sleep apnea (adult) (pediatric): Secondary | ICD-10-CM | POA: Diagnosis not present

## 2023-12-08 DIAGNOSIS — G894 Chronic pain syndrome: Secondary | ICD-10-CM | POA: Diagnosis not present

## 2023-12-08 DIAGNOSIS — Z8042 Family history of malignant neoplasm of prostate: Secondary | ICD-10-CM | POA: Diagnosis not present

## 2023-12-08 DIAGNOSIS — M47816 Spondylosis without myelopathy or radiculopathy, lumbar region: Secondary | ICD-10-CM | POA: Diagnosis not present

## 2023-12-08 DIAGNOSIS — J849 Interstitial pulmonary disease, unspecified: Secondary | ICD-10-CM | POA: Diagnosis not present

## 2023-12-08 DIAGNOSIS — I959 Hypotension, unspecified: Secondary | ICD-10-CM | POA: Diagnosis not present

## 2023-12-08 DIAGNOSIS — E785 Hyperlipidemia, unspecified: Secondary | ICD-10-CM | POA: Diagnosis not present

## 2023-12-08 DIAGNOSIS — S32040D Wedge compression fracture of fourth lumbar vertebra, subsequent encounter for fracture with routine healing: Secondary | ICD-10-CM | POA: Diagnosis not present

## 2023-12-08 DIAGNOSIS — M4856XA Collapsed vertebra, not elsewhere classified, lumbar region, initial encounter for fracture: Secondary | ICD-10-CM | POA: Diagnosis not present

## 2023-12-08 DIAGNOSIS — J8410 Pulmonary fibrosis, unspecified: Secondary | ICD-10-CM | POA: Diagnosis not present

## 2023-12-08 DIAGNOSIS — M549 Dorsalgia, unspecified: Secondary | ICD-10-CM | POA: Diagnosis not present

## 2023-12-08 DIAGNOSIS — M545 Low back pain, unspecified: Secondary | ICD-10-CM | POA: Diagnosis not present

## 2023-12-08 DIAGNOSIS — R262 Difficulty in walking, not elsewhere classified: Secondary | ICD-10-CM | POA: Diagnosis not present

## 2023-12-08 DIAGNOSIS — Z803 Family history of malignant neoplasm of breast: Secondary | ICD-10-CM | POA: Diagnosis not present

## 2023-12-08 DIAGNOSIS — F411 Generalized anxiety disorder: Secondary | ICD-10-CM | POA: Diagnosis not present

## 2023-12-08 DIAGNOSIS — Z885 Allergy status to narcotic agent status: Secondary | ICD-10-CM | POA: Diagnosis not present

## 2023-12-08 DIAGNOSIS — R339 Retention of urine, unspecified: Secondary | ICD-10-CM | POA: Diagnosis not present

## 2023-12-08 DIAGNOSIS — R338 Other retention of urine: Secondary | ICD-10-CM | POA: Diagnosis not present

## 2023-12-08 DIAGNOSIS — L8990 Pressure ulcer of unspecified site, unspecified stage: Secondary | ICD-10-CM | POA: Insufficient documentation

## 2023-12-08 DIAGNOSIS — D649 Anemia, unspecified: Secondary | ICD-10-CM | POA: Diagnosis not present

## 2023-12-08 MED ORDER — TAMSULOSIN HCL 0.4 MG PO CAPS
0.4000 mg | ORAL_CAPSULE | Freq: Every day | ORAL | Status: DC
  Administered 2023-12-08 – 2023-12-10 (×3): 0.4 mg via ORAL
  Filled 2023-12-08 (×3): qty 1

## 2023-12-08 MED ORDER — IPRATROPIUM-ALBUTEROL 0.5-2.5 (3) MG/3ML IN SOLN
3.0000 mL | Freq: Three times a day (TID) | RESPIRATORY_TRACT | Status: DC
  Administered 2023-12-09: 3 mL via RESPIRATORY_TRACT
  Filled 2023-12-08: qty 3

## 2023-12-08 MED ORDER — IPRATROPIUM-ALBUTEROL 0.5-2.5 (3) MG/3ML IN SOLN
3.0000 mL | Freq: Three times a day (TID) | RESPIRATORY_TRACT | Status: DC
  Administered 2023-12-08 (×3): 3 mL via RESPIRATORY_TRACT
  Filled 2023-12-08 (×3): qty 3

## 2023-12-08 MED ORDER — HYDROMORPHONE HCL 1 MG/ML IJ SOLN
0.7500 mg | INTRAMUSCULAR | Status: DC | PRN
  Administered 2023-12-08 – 2023-12-09 (×8): 0.75 mg via INTRAVENOUS
  Filled 2023-12-08 (×8): qty 1

## 2023-12-08 MED ORDER — TRAZODONE HCL 50 MG PO TABS
50.0000 mg | ORAL_TABLET | Freq: Every evening | ORAL | Status: DC | PRN
  Administered 2023-12-10: 50 mg via ORAL
  Filled 2023-12-08: qty 1

## 2023-12-08 MED ORDER — LIDOCAINE 5 % EX PTCH
2.0000 | MEDICATED_PATCH | CUTANEOUS | Status: DC
  Administered 2023-12-08 – 2023-12-11 (×4): 2 via TRANSDERMAL
  Filled 2023-12-08 (×4): qty 2

## 2023-12-08 MED ORDER — BISMUTH SUBSALICYLATE 262 MG/15ML PO SUSP
30.0000 mL | ORAL | Status: AC | PRN
  Filled 2023-12-08: qty 118

## 2023-12-08 MED ORDER — LORAZEPAM 2 MG/ML IJ SOLN
0.5000 mg | Freq: Four times a day (QID) | INTRAMUSCULAR | Status: DC | PRN
  Administered 2023-12-08 – 2023-12-09 (×3): 1 mg via INTRAVENOUS
  Filled 2023-12-08 (×3): qty 1

## 2023-12-08 MED ORDER — CALCITONIN (SALMON) 200 UNIT/ACT NA SOLN
1.0000 | Freq: Every day | NASAL | Status: DC
  Filled 2023-12-08: qty 3.7

## 2023-12-08 NOTE — TOC Initial Note (Signed)
 Transition of Care [facility]) - Initial/Assessment Note    Patient Details  Name: Kathleen [name] MRN: [id number] Date of Birth: Feb 07, 1961  Transition of Care [facility]) CM/SW Contact:    Lynda Sands, RN Phone Number: 12/08/2023, 6:32 PM  Clinical Narrative:    Patient admitted with Intractable back pain. CM spoke with patient and spouse at bedside. Patient spouse would like for patient to go to SNF for rehab.  Patient lives with her spouse. Patient spouse has bed assisting her with adl's. Patient has not been driving spouse take her to appointments. No Home serves before admission. Patient does have a Home Health to agency to provide care if she goes home.             Barriers to Discharge: Continued Medical Work up   Patient Goals and CMS Choice Patient states their goals for this hospitalization and ongoing recovery are:: Patient requested to go to SNF          Expected Discharge Plan and Services       Living arrangements for the past 2 months: Single Family Home                         Representative spoke with at DME Agency: Randel Buss HH Arranged: PT HH Agency: [facility] Date [facility] Agency Contacted: 12/08/23 Time HH Agency Contacted: [phone] Representative spoke with at [facility] Agency: Randel Buss  Prior Living Arrangements/Services Living arrangements for the past 2 months: Single Family Home Lives with:: Spouse                   Activities of Daily Living   ADL Screening (condition at time of admission) Independently performs ADLs?: No Does the patient have a NEW difficulty with bathing/dressing/toileting/self-feeding that is expected to last >3 days?: Yes (Initiates electronic notice to provider for possible OT consult) Does the patient have a NEW difficulty with getting in/out of bed, walking, or climbing stairs that is expected to last >3 days?: Yes (Initiates electronic notice to provider for possible PT consult) Does the patient have a NEW difficulty with  communication that is expected to last >3 days?: No Is the patient deaf or have difficulty hearing?: No Does the patient have difficulty seeing, even when wearing glasses/contacts?: No Does the patient have difficulty concentrating, remembering, or making decisions?: No  Permission Sought/Granted                  Emotional Assessment              Admission diagnosis:  Urinary retention [R33.9] Intractable back pain [M54.9] Ambulatory dysfunction [R26.2] Uncontrolled pain [R52] Patient Active Problem List   Diagnosis Date Noted   Uncontrolled pain 12/08/2023   Pressure injury of skin 12/08/2023   Intractable back pain 12/07/2023   Class 1 obesity due to excess calories with serious comorbidity and body mass index (BMI) of 34.0 to 34.9 in adult 10/28/2023   Prediabetes 10/28/2023   Ankle fracture, bimalleolar, closed, left, sequela 03/10/2023   Closed fracture of left ankle 03/10/2023   Syndesmotic disruption of ankle, left, initial encounter 03/10/2023   Lumbar transverse process fracture (HCC) 01/02/2023   Acute on chronic hypoxic respiratory failure (HCC) 12/13/2022   Depression, major, single episode, moderate (HCC) 09/10/2022   Aortic atherosclerosis (HCC) 09/10/2022   Chronic respiratory failure with hypoxia (HCC) 05/22/2021   Anxiety and depression 05/22/2021   Vitamin D deficiency 05/09/2021   Left foot drop  01/09/2021   Pulmonary HTN (HCC) 10/12/2020   Encounter for long-term opiate analgesic use 02/02/2020   Interstitial lung disease (HCC) 04/14/2019   GERD (gastroesophageal reflux disease) 10/15/2017   Personal history of noncompliance with medical treatment, presenting hazards to health 04/26/2017   Hypothyroidism 01/15/2017   Adrenal insufficiency (Addison's disease) (HCC) 08/22/2016   Symptomatic bradycardia    Adrenal insufficiency (HCC) 08/17/2016   Hypokalemia 08/17/2016   Chronic pain syndrome 04/20/2016   Irritable bowel syndrome with  constipation 11/14/2015   Avascular necrosis of bone of hip, left (HCC) 04/05/2015   Post herpetic neuralgia 10/22/2014   Mixed hyperlipidemia 08/25/2014   Obstructive sleep apnea 02/18/2014   Class 2 severe obesity due to excess calories with serious comorbidity and body mass index (BMI) of 38.0 to 38.9 in adult [facility]) 05/06/2013   Fibromyalgia 02/11/2013   Bilateral lower extremity edema 01/22/2013   Hypotension 01/16/2013   Fibrocystic breast disease in female 04/09/2012   Sarcoidosis 06/21/2008   PCP:  Bennet Brasil, MD Pharmacy:   [facility] [phone] - [place], [place] - [address] AT [facility] [address] [place] [address] Phone: [phone] Fax: [phone]  [place] [facility] [address] [place] [address] Phone: [phone] Fax: [phone]  [place] [facility] [address] [place] [address] Phone: [phone] Fax: [phone]     Social Drivers of Health (SDOH) Social History: SDOH Screenings   Food Insecurity: No Food Insecurity (12/07/2023)  Housing: Low Risk  (12/07/2023)  Transportation Needs: No Transportation Needs (12/07/2023)  Utilities: Not At Risk (12/07/2023)  Alcohol Screen: Low Risk  (09/27/2023)  Depression (PHQ2-9): Low Risk  (10/22/2023)  Financial Resource Strain: Low Risk  (09/27/2023)  Physical Activity: Inactive (09/27/2023)  Social Connections: Moderately Integrated (09/27/2023)  Stress: No Stress Concern Present (09/27/2023)  Tobacco Use: Low Risk  (12/07/2023)  Health Literacy: Adequate Health Literacy (09/27/2023)   SDOH Interventions:     Readmission Risk Interventions    04/08/2023   10:38 AM  Readmission Risk Prevention Plan  Transportation Screening Complete  Medication Review (RN Care Manager) Complete  HRI or Home Care Consult Complete  SW Recovery Care/Counseling Consult Complete  Palliative Care Screening  Not Applicable  Skilled Nursing Facility Not Applicable

## 2023-12-08 NOTE — Evaluation (Addendum)
 Physical Therapy Evaluation Patient Details Name: Kathleen [name] MRN: [id number] DOB: 1960/10/08 Today's Date: 12/08/2023  History of Present Illness  a 63 y.o. female with medical history significant for chronic respiratory failure on 5 L, interstitial lung disease, sarcoidosis, Addison's disease, chronic pain syndrome, anxiety and depression, hypothyroidism.  Patient presented to the ED with complaints of sudden onset of back pain.  Back pain started in December, where she reports a few days ago she stood up to walk and felt a pop in her back with subsequent severe pain.  Pain is worse with movement.  She is unable to ambulate.  No numbness or weakness in her lower extremities.  Clinical Impression   Pt was limited in today's Physical Therapy Evaluation, due to significant pain increase in low back. Pt's baseline is independent with transfers, walks RW and uses scooter 50% of time. Pt unsafe for mobility due to poor command following and impulsive behavior with constant remarks regarding abdominal or low back pain. Pt was educated on deep breathing and redirection regarding pain. Currently, pt demonstrating limited mobility and ambulation due to muscle weakness, deconditioning and primarily pain. DC recommendations SNF vs HHPT, pt is not wanting Rehab facility. RN notified of pain.   Based upon these deficits/impairments, patient will benefit from continued skilled physical therapy services during remainder of hospital stay and at the next recommended venue of care to address deficits and promote return to optimal function.                If plan is discharge home, recommend the following: A lot of help with walking and/or transfers;A lot of help with bathing/dressing/bathroom   Can travel by private vehicle        Equipment Recommendations None recommended by PT  Recommendations for Other Services       Functional Status Assessment Patient has had a recent decline in their functional  status and demonstrates the ability to make significant improvements in function in a reasonable and predictable amount of time.     Precautions / Restrictions Restrictions Weight Bearing Restrictions Per Provider Order: Yes RLE Weight Bearing Per Provider Order: Non weight bearing LLE Weight Bearing Per Provider Order: Non weight bearing      Mobility  Bed Mobility Overal bed mobility: Needs Assistance Bed Mobility: Sit to Supine       Sit to supine: Contact guard assist   General bed mobility comments: received sitting on BSC with RN present. after BM on BSC, pt placed herself supine in HOB elevated positiong with drastic speed and unsafe manner.    Transfers Overall transfer level: Needs assistance   Transfers: Bed to chair/wheelchair/BSC   Stand pivot transfers: Supervision         General transfer comment: Pt performed sit/stand from [facility] [place] and standpivot from [facility] to EOB with drastic, speed in unsafe manner. pt independent but unsafe due to sponateous movement.    Ambulation/Gait               General Gait Details: Unable  Stairs            Wheelchair Mobility     Tilt Bed    Modified Rankin (Stroke Patients Only)       Balance Overall balance assessment: Needs assistance Sitting-balance support: No upper extremity supported Sitting balance-Leahy Scale: Good Sitting balance - Comments: sitting BSC and EOB     Standing balance-Leahy Scale: Poor Standing balance comment: fast speed  Pertinent Vitals/Pain Pain Assessment Pain Assessment: 0-10 Pain Score: 10-Worst pain ever Pain Location: "way over 10 and meds aren't touching it" Pain Intervention(s): Monitored during session, Patient requesting pain meds-RN notified    Home Living Family/patient expects to be discharged to:: Private residence Living Arrangements: Spouse/significant other Available Help at Discharge: Family;Available  PRN/intermittently Type of Home: House Home Access: Stairs to enter Entrance Stairs-Rails: None Entrance Stairs-Number of Steps: 1   Home Layout: One level Home Equipment: Agricultural consultant (2 wheels);Electric scooter;Shower seat;Hand held shower head      Prior Function Prior Level of Function : History of Falls (last six months);Needs assist             Mobility Comments: Pt reports walking and using scooter half time. ADLs Comments: Pt reports husband helps with all ADLs     Extremity/Trunk Assessment   Upper Extremity Assessment Upper Extremity Assessment: Defer to OT evaluation    Lower Extremity Assessment Lower Extremity Assessment: Generalized weakness;Overall WFL for tasks assessed       Communication   Communication Communication: No apparent difficulties    Cognition Arousal: Alert Behavior During Therapy: WFL for tasks assessed/performed   PT - Cognitive impairments: No apparent impairments                         Following commands: Intact       Cueing Cueing Techniques: Verbal cues     General Comments      Exercises     Assessment/Plan    PT Assessment Patient needs continued PT services  PT Problem List Decreased strength;Decreased activity tolerance;Decreased balance;Decreased mobility       PT Treatment Interventions Gait training;Functional mobility training;Therapeutic activities;Therapeutic exercise;Balance training;Neuromuscular re-education    PT Goals (Current goals can be found in the Care Plan section)  Acute Rehab PT Goals Patient Stated Goal: return home PT Goal Formulation: With patient Time For Goal Achievement: 12/22/23 Potential to Achieve Goals: Good    Frequency Min 3X/week     Co-evaluation               AM-PAC PT "6 Clicks" Mobility  Outcome Measure Help needed turning from your back to your side while in a flat bed without using bedrails?: None Help needed moving from lying on your back  to sitting on the side of a flat bed without using bedrails?: None Help needed moving to and from a bed to a chair (including a wheelchair)?: None Help needed standing up from a chair using your arms (e.g., wheelchair or bedside chair)?: None Help needed to walk in hospital room?: Total Help needed climbing 3-5 steps with a railing? : Total 6 Click Score: 18    End of Session   Activity Tolerance: Patient limited by pain Patient left: in bed;with call bell/phone within reach;with bed alarm set Nurse Communication: Mobility status PT Visit Diagnosis: Unsteadiness on feet (R26.81);Muscle weakness (generalized) (M62.81)    Time: 5621-3086 PT Time Calculation (min) (ACUTE ONLY): 19 min   Charges:   PT Evaluation $PT Eval Low Complexity: 1 Low   PT General Charges $$ ACUTE PT VISIT: 1 Visit         Astrid Lay, DPT [facility] [place] 336 [phone] office  Gatha Kaska 12/08/2023, 11:09 AM

## 2023-12-08 NOTE — Hospital Course (Signed)
 63 y.o. female with medical history significant for chronic respiratory failure on 5 L, interstitial lung disease, sarcoidosis, Addison's disease, chronic pain syndrome, anxiety and depression, hypothyroidism.  Patient presented to the ED with complaints of sudden onset of back pain.  Back pain started in December, where she reports a few days ago she stood up to walk and felt a pop in her back with subsequent severe pain.  Pain is worse with movement.  She is unable to ambulate.  No numbness or weakness in her lower extremities.   ED Course: Blood pressure 102/41-137/50.  O2 sats 94 to 100% on 5 L.  MRI lumbar spine shows acute/subacute L4 compression fracture.  EDP talked to neurosurgeon on-call, recommended LSO brace.  Patient unable to void in ED, requiring In-N-Out cath, about 400 mL retained in the bladder. Patient received Dilaudid, dexamethasone 10 mg.  With persistent uncontrolled pain hospitalist was called for admission.

## 2023-12-08 NOTE — Progress Notes (Signed)
   12/08/23 1350  TOC Discharge Assessment  Final next level of care Home w Home Health Services  Once discharged, how will the patient get to their discharge location? Family/Friend - Partnered Transport  Has discharge transport plan been identified? Yes  Barriers to Discharge Continued Medical Work up  Patient states their goals for this hospitalization and ongoing recovery are: Home  Representative spoke with at DME Agency Randel Buss  [facility] Arranged PT  [facility] Agency [facility]  Date [facility] Agency Contacted 12/08/23  Time [facility] Agency Contacted [phone]  Representative spoke with at [facility] Agency [place]   CM met with patient at bedside to follow up with home health choice. Patient agreed to home health PT. LOC will continue to monitor patient advancement through interdisciplinary progression rounds.

## 2023-12-08 NOTE — Plan of Care (Signed)
  Problem: Acute Rehab PT Goals(only PT should resolve) Goal: Pt Will Go Supine/Side To Sit Flowsheets (Taken 12/08/2023 1112) Pt will go Supine/Side to Sit: Independently Goal: Patient Will Perform Sitting Balance Flowsheets (Taken 12/08/2023 1112) Patient will perform sitting balance: Independently Goal: Patient Will Transfer Sit To/From Stand Flowsheets (Taken 12/08/2023 1112) Patient will transfer sit to/from stand: Independently Goal: Pt Will Ambulate Flowsheets (Taken 12/08/2023 1112) Pt will Ambulate:  50 feet  with modified independence  with least restrictive assistive device  Gatha Kaska PT, DPT [facility] [place] 336 [phone] office

## 2023-12-08 NOTE — Progress Notes (Signed)
   12/08/23 1830  TOC Assessment  TOC screening is complete Yes  Once discharged, how will the patient get to their discharge location? Family/Friend - Partnered Transport  Barriers to Discharge Continued Medical Work up  Patient states their goals for this hospitalization and ongoing recovery are: Patient requested to go to SNF  Living arrangements for the past 2 months Single Family Home  Lives with: Spouse  [facility] Agency [facility]

## 2023-12-08 NOTE — Progress Notes (Addendum)
 PROGRESS NOTE   Kathleen [name]  BJY:[id number] DOB: October 18, 1960 DOA: 12/07/2023 PCP: Bennet Brasil, MD   Chief Complaint  Patient presents with   Back Pain   Level of care: Med-Surg  Brief Admission History:  63 y.o. female with medical history significant for chronic respiratory failure on 5 L, interstitial lung disease, sarcoidosis, Addison's disease, chronic pain syndrome, anxiety and depression, hypothyroidism.  Patient presented to the ED with complaints of sudden onset of back pain.  Back pain started in December, where she reports a few days ago she stood up to walk and felt a pop in her back with subsequent severe pain.  Pain is worse with movement.  She is unable to ambulate.  No numbness or weakness in her lower extremities.   ED Course: Blood pressure 102/41-137/50.  O2 sats 94 to 100% on 5 L.  MRI lumbar spine shows acute/subacute L4 compression fracture.  EDP talked to neurosurgeon on-call, recommended LSO brace.  Patient unable to void in ED, requiring In-N-Out cath, about 400 mL retained in the bladder. Patient received Dilaudid, dexamethasone 10 mg.  With persistent uncontrolled pain hospitalist was called for admission.   Assessment and Plan:  L4 Compression Fracture  Intractable back pain No falls no trauma.  Patient on chronic steroids.  MRI shows acute/subacute L4 compression fracture.  Admitted for pain control.  Urinary retention likely from uncontrolled pain. -EDP talked to neurosurgery, recommended LSO brace, does not warrant neurosurgical intervention. -IV Dilaudid 0.75 mg every 4 hourly as needed -Foley catheter should be removed tomorrow 4/14 -PT/OT evaluation - recommending HH as patient adamantly declines SNF -still awaiting LSO brace to be delivered  Acute Urinary Retention - likely from uncontrolled pain and immobility - foley in place temporarily  - flomax ordered with supper  - plan to remove foley on 4/14 and do a void trial   Adrenal insufficiency  (Addison's disease)  - Resume Solu-Cortef and midodrine, also on prednisone, will resume. - Doubt need for stress dose steroids at this time  Pulmonary Fibrosis and Sarcoidosis - she is on daily prednisone for this and seems at baseline at this time  Anxiety and depression Appears anxious likely from uncontrolled pain. -Resume Cymbalta, hydroxyzine, buspirone  Chronic respiratory failure with hypoxia  On 5 L at baseline- Sats currently 94 to 100%  Obstructive sleep apnea Pt says she does not use CPAP but uses nocturnal [place] oxygen supplement  Bilateral lower extremity edema She is a Lasix 20 mg 3 times a week.  1+ pitting bilateral lower extremity edema, mildly worse than baseline.  Weights per chart unchanged.  Last echo 2018 EF 55 to 60%. - IV Lasix 20 mg x 1, continue home Lasix  Hypotension Diastolic on the low side otherwise stable. -Resume midodrine  DVT prophylaxis: lovenox Code Status: Full  Family Communication:  Disposition: home with home health recommended by therapy team    Consultants:  Neurosurgery consulted 4/12   Procedures:   Antimicrobials:    Subjective: Pt having a lot of aches and pain in the lumbar spine.  No loss of bowel or bladder function.  She says she has chronic hip pain and has been maintained on oral oxycodone tablets.  She is agreeable to try a lidocaine patch on her back to help with pain management.    Objective: Vitals:   12/08/23 0837 12/08/23 0943 12/08/23 1343 12/08/23 1411  BP:  (!) 133/58 (!) 141/63   Pulse:  85 81   Resp:  18  16   Temp:  98 F (36.7 C) 98.6 F (37 C)   TempSrc:  Oral Oral   SpO2: 97% 98% 97% 93%  Weight:      Height:        Intake/Output Summary (Last 24 hours) at 12/08/2023 1520 Last data filed at 12/08/2023 0238 Gross per 24 hour  Intake --  Output 2950 ml  Net -2950 ml   Filed Weights   12/07/23 1310  Weight: 108.9 kg   Examination:  General exam: Appears calm and uncomfortable lying on side  not distressed Respiratory system: Clear to auscultation. Respiratory effort normal. Cardiovascular system: normal S1 & S2 heard. No JVD, murmurs, rubs, gallops or clicks. No pedal edema. Gastrointestinal system: Abdomen is nondistended, soft and nontender. No organomegaly or masses felt. Normal bowel sounds heard. Central nervous system: Alert and oriented. No focal neurological deficits. Extremities: Symmetric 5 x 5 power. Skin: No rashes, lesions or ulcers. Psychiatry: Judgement and insight appear normal. Mood & affect appropriate.      Data Reviewed: I have personally reviewed following labs and imaging studies  CBC: Recent Labs  Lab 12/07/23 1344  WBC 9.2  HGB 9.7*  HCT 32.8*  MCV 97.3  PLT 393    Basic Metabolic Panel: Recent Labs  Lab 12/07/23 1344  NA 138  K 3.5  CL 97*  CO2 32  GLUCOSE 115*  BUN 13  CREATININE 0.73  CALCIUM 8.4*    CBG: No results for input(s): "GLUCAP" in the last 168 hours.  No results found for this or any previous visit (from the past 240 hours).   Radiology Studies: MR Lumbar Spine W Wo Contrast Result Date: 12/07/2023 CLINICAL DATA:  Low back pain, cauda equina syndrome suspected. Incontinence. EXAM: MRI LUMBAR SPINE WITHOUT AND WITH CONTRAST TECHNIQUE: Multiplanar and multiecho pulse sequences of the lumbar spine were obtained without and with intravenous contrast. CONTRAST:  10mL GADAVIST GADOBUTROL 1 MMOL/ML IV SOLN COMPARISON:  MRI lumbar spine 01/16/2021. CT abdomen and pelvis 08/05/2023. FINDINGS: Segmentation: Mildly transitional L5 with left-sided L5-S1 assimilation joint. Alignment:  Normal. Vertebrae: New L4 superior endplate compression fracture with 20% vertebral body height loss and moderate marrow edema. No significant retropulsion. No suspicious marrow lesion. Conus medullaris and cauda equina: Conus extends to the L1 level. Conus and cauda equina appear normal. Paraspinal and other soft tissues: Unremarkable. Disc levels:  T12-L1 through L2-3: Disc desiccation. No disc herniation or stenosis. L3-4: Disc desiccation. Minimal disc bulging and mild facet hypertrophy without stenosis, unchanged from the prior MRI. L4-5: Disc desiccation. Minimal disc bulging, stable to slightly increased. Mild facet hypertrophy. No stenosis. L5-S1: Transitional anatomy. Effacement of the thecal sac by epidural fat. No disc herniation or stenosis. IMPRESSION: 1. Acute/subacute L4 compression fracture. 2. Mild lumbar spondylosis without stenosis. Electronically Signed   By: Aundra Lee M.D.   On: 12/07/2023 18:10   Scheduled Meds:  budesonide  0.5 mg Nebulization BID   busPIRone  15 mg Oral TID   DULoxetine  60 mg Oral Daily   enoxaparin (LOVENOX) injection  55 mg Subcutaneous Q24H   [START ON 12/09/2023] furosemide  20 mg Oral Q M,W,F   hydrocortisone  10 mg Oral Q breakfast   And   hydrocortisone  5 mg Oral Q lunch   ipratropium-albuterol  3 mL Nebulization Q8H   levothyroxine  50 mcg Oral QAC breakfast   lidocaine  2 patch Transdermal Q24H   linaclotide  290 mcg Oral QAC breakfast   methocarbamol  1,000 mg Oral Q8H   midodrine  5 mg Oral BID WC   pantoprazole  40 mg Oral BID AC   predniSONE  5 mg Oral q AM   rosuvastatin  20 mg Oral Daily   Continuous Infusions:   LOS: 0 days   Time spent: 55 mins  Woodrow Dulski Lincoln Renshaw, MD How to contact the [facility] Attending or Consulting provider 7A - 7P or covering provider during after hours 7P -7A, for this patient?  Check the care team in [facility] and look for a) attending/consulting TRH provider listed and b) the TRH team listed Log into [email or web address] to find provider on call.  Locate the TRH provider you are looking for under [facility] and page to a number that you can be directly reached. If you still have difficulty reaching the provider, please page the [facility] (Director on Call) for the Hospitalists listed on amion for assistance.  12/08/2023, 3:20 PM

## 2023-12-08 NOTE — Progress Notes (Signed)
 Orthopedic Tech Progress Note Patient Details:  ADDYLIN MANKE 1961/03/25 [id number]  Patient ID: Sherron Dom, female   DOB: Jan 19, 1961, 63 y.o.   MRN: [id number] I called lso order into [employer]. Terryann Fiddler 12/08/2023, 6:11 AM

## 2023-12-08 NOTE — Care Management Obs Status (Deleted)
 MEDICARE OBSERVATION STATUS NOTIFICATION   Patient Details  Name: Kathleen [name] MRN: [id number] Date of Birth: Nov 07, 1960   Medicare Observation Status Notification Given:   Yes.    Lynda Sands, RN 12/08/2023, 8:05 AM

## 2023-12-08 NOTE — Care Management Obs Status (Signed)
 MEDICARE OBSERVATION STATUS NOTIFICATION   Patient Details  Name: Kathleen [name] MRN: [id number] Date of Birth: Mar 31, 1961   Medicare Observation Status Notification Given:   Yes    Lynda Sands, RN 12/08/2023, 11:29 AM

## 2023-12-09 ENCOUNTER — Other Ambulatory Visit (HOSPITAL_COMMUNITY): Payer: Self-pay

## 2023-12-09 DIAGNOSIS — J849 Interstitial pulmonary disease, unspecified: Secondary | ICD-10-CM | POA: Diagnosis not present

## 2023-12-09 DIAGNOSIS — E271 Primary adrenocortical insufficiency: Secondary | ICD-10-CM | POA: Diagnosis not present

## 2023-12-09 DIAGNOSIS — M549 Dorsalgia, unspecified: Secondary | ICD-10-CM | POA: Diagnosis not present

## 2023-12-09 DIAGNOSIS — R600 Localized edema: Secondary | ICD-10-CM | POA: Diagnosis not present

## 2023-12-09 MED ORDER — ACETAMINOPHEN 325 MG PO TABS
650.0000 mg | ORAL_TABLET | Freq: Four times a day (QID) | ORAL | Status: DC
  Administered 2023-12-09 – 2023-12-11 (×8): 650 mg via ORAL
  Filled 2023-12-09 (×8): qty 2

## 2023-12-09 MED ORDER — CHLORHEXIDINE GLUCONATE CLOTH 2 % EX PADS
6.0000 | MEDICATED_PAD | Freq: Every day | CUTANEOUS | Status: DC
  Administered 2023-12-09: 6 via TOPICAL

## 2023-12-09 MED ORDER — LORAZEPAM 2 MG/ML IJ SOLN
1.0000 mg | Freq: Once | INTRAMUSCULAR | Status: AC
Start: 2023-12-09 — End: 2023-12-10
  Administered 2023-12-10: 1 mg via INTRAVENOUS
  Filled 2023-12-09 (×2): qty 1

## 2023-12-09 MED ORDER — ORAL CARE MOUTH RINSE: 15.0000 mL | OROMUCOSAL | Status: DC | PRN

## 2023-12-09 MED ORDER — HYDROMORPHONE HCL 1 MG/ML IJ SOLN
0.5000 mg | INTRAMUSCULAR | Status: DC | PRN
  Administered 2023-12-09 – 2023-12-10 (×3): 0.5 mg via INTRAVENOUS
  Filled 2023-12-09 (×3): qty 0.5

## 2023-12-09 MED ORDER — IPRATROPIUM-ALBUTEROL 0.5-2.5 (3) MG/3ML IN SOLN
3.0000 mL | Freq: Two times a day (BID) | RESPIRATORY_TRACT | Status: DC
Start: 2023-12-09 — End: 2023-12-11
  Administered 2023-12-09 – 2023-12-11 (×4): 3 mL via RESPIRATORY_TRACT
  Filled 2023-12-09 (×4): qty 3

## 2023-12-09 MED ORDER — ACETAMINOPHEN 650 MG RE SUPP: 650.0000 mg | Freq: Four times a day (QID) | RECTAL | Status: DC

## 2023-12-09 NOTE — Progress Notes (Signed)
 RT found patient had removed O2 and sats were 84%. RT gave scheduled treatment and placed patient back on 5L. Sats improved to 99%.

## 2023-12-09 NOTE — Progress Notes (Signed)
 RE: Kathleen [name]  DOB: [birth year] Date: 12/09/2023  MUST ID: [id number]  To Whom It May Concern:   Please be advised that the above name patient will require a short-term nursing home stay- anticipated 30 days or less rehabilitation and strengthening. The plan is for return home.

## 2023-12-09 NOTE — TOC Progression Note (Addendum)
 Transition of Care [facility]) - Progression Note    Patient Details  Name: Kathleen [name] MRN: [id number] Date of Birth: November 25, 1960  Transition of Care [facility]) CM/SW Contact  Grandville Lax, [place] Phone Number: 12/09/2023, 10:27 AM  Clinical Narrative:    CSW spoke with pts spouse who is at bedside with pt (she is sleeping). CSW confirmed they would like SNF referral be sent out and only want it sent to [facility]. CSW to send referral. CSW explained that if [facility] cannot offer TOC will follow up for alternative options. TOC to follow.   Addendum: CSW updated that [facility] is unable to offer a bed to pt at this time. CSW updated spouse who states they are okay with referral being sent to local facilities for review but he is unsure that pt will want to go anywhere but [facility]. CSW explained also to pt spouse that it is very important that pt get up and work with PT in order for insurance to consider approving SNF. Pts spouse states he will talk to her about it. TOC to follow.    Barriers to Discharge: Continued Medical Work up  Expected Discharge Plan and Services       Living arrangements for the past 2 months: Single Family Home                         Representative spoke with at DME Agency: Randel Buss HH Arranged: PT HH Agency: [facility] Date [facility] Agency Contacted: 12/08/23 Time HH Agency Contacted: 0149 Representative spoke with at [facility] Agency: Randel Buss   Social Determinants of Health (SDOH) Interventions SDOH Screenings   Food Insecurity: No Food Insecurity (12/07/2023)  Housing: Low Risk  (12/07/2023)  Transportation Needs: No Transportation Needs (12/07/2023)  Utilities: Not At Risk (12/07/2023)  Alcohol Screen: Low Risk  (09/27/2023)  Depression (PHQ2-9): Low Risk  (10/22/2023)  Financial Resource Strain: Low Risk  (09/27/2023)  Physical Activity: Inactive (09/27/2023)  Social Connections: Moderately Integrated (09/27/2023)  Stress: No Stress Concern Present (09/27/2023)  Tobacco Use: Low Risk   (12/07/2023)  Health Literacy: Adequate Health Literacy (09/27/2023)    Readmission Risk Interventions    04/08/2023   10:38 AM  Readmission Risk Prevention Plan  Transportation Screening Complete  Medication Review (RN Care Manager) Complete  HRI or Home Care Consult Complete  SW Recovery Care/Counseling Consult Complete  Palliative Care Screening Not Applicable  Skilled Nursing Facility Not Applicable

## 2023-12-09 NOTE — Progress Notes (Signed)
 Physical Therapy Treatment Patient Details Name: Kathleen [name] MRN: [id number] DOB: 06-02-1961 Today's Date: 12/09/2023   History of Present Illness Kathleen [name] is a 63 y.o. female with medical history significant for chronic respiratory failure on 5 L, interstitial lung disease, sarcoidosis, Addison's disease, chronic pain syndrome, anxiety and depression, hypothyroidism.  Patient presented to the ED with complaints of sudden onset of back pain.  Back pain started in December, where she reports a few days ago she stood up to walk and felt a pop in her back with subsequent severe pain.  Pain is worse with movement.  She is unable to ambulate.  No numbness or weakness in her lower extremities.    PT Comments  Patient agreeable for therapy.  Patient demonstrates slow labored movement for sitting up from side lying position due to back pain and required support pushing from behind for sitting up at bedside.  Patient able to complete BLE exercises while seated at bedside, limited to a few side steps at bedside before having to sit due to increasing low back pain during transfer to chair and tolerated sitting up on [facility] with family member present after therapy - nurse notified. Patient will benefit from continued skilled physical therapy in hospital and recommended venue below to increase strength, balance, endurance for safe ADLs and gait.      If plan is discharge home, recommend the following: A lot of help with walking and/or transfers;A lot of help with bathing/dressing/bathroom;Help with stairs or ramp for entrance;Assistance with cooking/housework   Can travel by private vehicle     No  Equipment Recommendations  None recommended by PT    Recommendations for Other Services       Precautions / Restrictions Precautions Precautions: Fall Restrictions Weight Bearing Restrictions Per Provider Order: No     Mobility  Bed Mobility Overal bed mobility: Needs Assistance Bed Mobility:  Rolling, Sidelying to Sit Rolling: Min assist, Mod assist Sidelying to sit: Mod assist, Max assist       General bed mobility comments: slow labored movement with poor carryover for side lying to sitting due to increasing low back pain    Transfers Overall transfer level: Needs assistance Equipment used: Rolling walker (2 wheels) Transfers: Sit to/from Stand, Bed to chair/wheelchair/BSC Sit to Stand: Mod assist Stand pivot transfers: Mod assist         General transfer comment: unsteady labored movement    Ambulation/Gait Ambulation/Gait assistance: Mod assist Gait Distance (Feet): 3 Feet Assistive device: Rolling walker (2 wheels) Gait Pattern/deviations: Decreased step length - left, Decreased step length - right, Decreased stride length Gait velocity: slow     General Gait Details: limited to few unteady labored side steps before having to sit due to c/o increasing low back pain   Stairs             Wheelchair Mobility     Tilt Bed    Modified Rankin (Stroke Patients Only)       Balance Overall balance assessment: Needs assistance Sitting-balance support: Feet supported, No upper extremity supported Sitting balance-Leahy Scale: Fair Sitting balance - Comments: fair/good seated at EOB   Standing balance support: Reliant on assistive device for balance, During functional activity, Bilateral upper extremity supported Standing balance-Leahy Scale: Poor Standing balance comment: fair/poor using RW                            Communication Communication Communication: No apparent difficulties  Cognition Arousal: Alert Behavior During Therapy: WFL for tasks assessed/performed   PT - Cognitive impairments: No apparent impairments                         Following commands: Intact      Cueing Cueing Techniques: Verbal cues  Exercises General Exercises - Lower Extremity Long Arc Quad: Seated, AROM, Strengthening, Both, 10  reps Hip Flexion/Marching: Seated, AROM, Strengthening, Both, 10 reps Toe Raises: Seated, AROM, Strengthening, Both, 10 reps Heel Raises: Seated, AROM, Strengthening, Both, 10 reps    General Comments        Pertinent Vitals/Pain Pain Assessment Pain Assessment: Faces Faces Pain Scale: Hurts whole lot Pain Location: low back with movement Pain Descriptors / Indicators: Grimacing, Guarding, Sharp, Moaning Pain Intervention(s): Limited activity within patient's tolerance, Monitored during session, Premedicated before session, Repositioned    Home Living                          Prior Function            PT Goals (current goals can now be found in the care plan section) Acute Rehab PT Goals Patient Stated Goal: return home PT Goal Formulation: With patient/family Time For Goal Achievement: 12/22/23 Potential to Achieve Goals: Good Progress towards PT goals: Progressing toward goals    Frequency    Min 3X/week      PT Plan      Co-evaluation              AM-PAC PT "6 Clicks" Mobility   Outcome Measure  Help needed turning from your back to your side while in a flat bed without using bedrails?: A Lot Help needed moving from lying on your back to sitting on the side of a flat bed without using bedrails?: A Lot Help needed moving to and from a bed to a chair (including a wheelchair)?: A Lot Help needed standing up from a chair using your arms (e.g., wheelchair or bedside chair)?: A Lot Help needed to walk in hospital room?: A Lot Help needed climbing 3-5 steps with a railing? : Total 6 Click Score: 11    End of Session Equipment Utilized During Treatment: Oxygen Activity Tolerance: Patient tolerated treatment well;Patient limited by fatigue Patient left: with family/visitor present;Other (comment) (left seated on [facility]) Nurse Communication: Mobility status PT Visit Diagnosis: Unsteadiness on feet (R26.81);Muscle weakness (generalized) (M62.81);Other  abnormalities of gait and mobility (R26.89)     Time: 1610-9604 PT Time Calculation (min) (ACUTE ONLY): 22 min  Charges:    $Therapeutic Exercise: 8-22 mins $Therapeutic Activity: 8-22 mins PT General Charges $$ ACUTE PT VISIT: 1 Visit                     3:53 PM, 12/09/23 Walton Guppy, MPT Physical Therapist with [facility] 336 [phone] office [phone] mobile phone

## 2023-12-09 NOTE — NC FL2 (Signed)
 [place]  MEDICAID FL2 LEVEL OF CARE FORM     IDENTIFICATION  Patient Name: Kathleen [name] Birthdate: 02/18/1961 Sex: female Admission Date (Current Location): 12/07/2023  [facility] [address] and [place] Number:  [employer] and Address:  [facility],  [address] [place] [address]      Provider Number: [id number]  Attending Physician Name and Address:  Rayfield Cairo, MD  Relative Name and Phone Number:       Current Level of Care: Hospital Recommended Level of Care: Skilled Nursing Facility Prior Approval Number:    Date Approved/Denied:   PASRR Number:    Discharge Plan: SNF    Current Diagnoses: Patient Active Problem List   Diagnosis Date Noted   Uncontrolled pain 12/08/2023   Pressure injury of skin 12/08/2023   Intractable back pain 12/07/2023   Class 1 obesity due to excess calories with serious comorbidity and body mass index (BMI) of 34.0 to 34.9 in adult 10/28/2023   Prediabetes 10/28/2023   Ankle fracture, bimalleolar, closed, left, sequela 03/10/2023   Closed fracture of left ankle 03/10/2023   Syndesmotic disruption of ankle, left, initial encounter 03/10/2023   Lumbar transverse process fracture (HCC) 01/02/2023   Acute on chronic hypoxic respiratory failure (HCC) 12/13/2022   Depression, major, single episode, moderate (HCC) 09/10/2022   Aortic atherosclerosis (HCC) 09/10/2022   Chronic respiratory failure with hypoxia (HCC) 05/22/2021   Anxiety and depression 05/22/2021   Vitamin D deficiency 05/09/2021   Left foot drop 01/09/2021   Pulmonary HTN (HCC) 10/12/2020   Encounter for long-term opiate analgesic use 02/02/2020   Interstitial lung disease (HCC) 04/14/2019   GERD (gastroesophageal reflux disease) 10/15/2017   Personal history of noncompliance with medical treatment, presenting hazards to health 04/26/2017   Hypothyroidism 01/15/2017   Adrenal insufficiency (Addison's disease) (HCC) 08/22/2016   Symptomatic  bradycardia    Adrenal insufficiency (HCC) 08/17/2016   Hypokalemia 08/17/2016   Chronic pain syndrome 04/20/2016   Irritable bowel syndrome with constipation 11/14/2015   Avascular necrosis of bone of hip, left (HCC) 04/05/2015   Post herpetic neuralgia 10/22/2014   Mixed hyperlipidemia 08/25/2014   Obstructive sleep apnea 02/18/2014   Class 2 severe obesity due to excess calories with serious comorbidity and body mass index (BMI) of 38.0 to 38.9 in adult [facility]) 05/06/2013   Fibromyalgia 02/11/2013   Bilateral lower extremity edema 01/22/2013   Hypotension 01/16/2013   Fibrocystic breast disease in female 04/09/2012   Sarcoidosis 06/21/2008    Orientation RESPIRATION BLADDER Height & Weight     Self, Time, Situation, Place  O2 (5L) Continent, Indwelling catheter Weight: 240 lb (108.9 kg) Height:  5\' 10"  (177.8 cm)  BEHAVIORAL SYMPTOMS/MOOD NEUROLOGICAL BOWEL NUTRITION STATUS      Continent Diet (Regular)  AMBULATORY STATUS COMMUNICATION OF NEEDS Skin   Extensive Assist Verbally Normal                       Personal Care Assistance Level of Assistance  Bathing, Feeding, Dressing Bathing Assistance: Limited assistance Feeding assistance: Independent Dressing Assistance: Limited assistance     Functional Limitations Info  Sight, Hearing, Speech Sight Info: Adequate Hearing Info: Adequate Speech Info: Adequate    SPECIAL CARE FACTORS FREQUENCY  PT (By licensed PT), OT (By licensed OT)     PT Frequency: 5 times weekly OT Frequency: 5 times weekly            Contractures Contractures Info: Not present    Additional  Factors Info  Code Status, Allergies Code Status Info: FULL Allergies Info: Bee Venom, Contrast Media (Iodinated Contrast Media), Bactrim (Sulfamethoxazole-trimethoprim), Codeine           Current Medications (12/09/2023):  This is the current hospital active medication list Current Facility-Administered Medications  Medication Dose Route  Frequency Provider Last Rate Last Admin   acetaminophen (TYLENOL) tablet 650 mg  650 mg Oral Q6H PRN Emokpae, Ejiroghene E, MD   650 mg at 12/09/23 0609   Or   acetaminophen (TYLENOL) suppository 650 mg  650 mg Rectal Q6H PRN Emokpae, Ejiroghene E, MD       albuterol (PROVENTIL) (2.5 MG/3ML) 0.083% nebulizer solution 2.5 mg  2.5 mg Nebulization Q4H PRN Emokpae, Ejiroghene E, MD       bismuth subsalicylate (PEPTO BISMOL) 262 MG/15ML suspension 30 mL  30 mL Oral Q4H PRN Johnson, Clanford L, MD       budesonide (PULMICORT) nebulizer solution 0.5 mg  0.5 mg Nebulization BID Emokpae, Ejiroghene E, MD   0.5 mg at 12/09/23 0740   busPIRone (BUSPAR) tablet 15 mg  15 mg Oral TID Emokpae, Ejiroghene E, MD   15 mg at 12/09/23 0900   DULoxetine (CYMBALTA) DR capsule 60 mg  60 mg Oral Daily Emokpae, Ejiroghene E, MD   60 mg at 12/09/23 0901   enoxaparin (LOVENOX) injection 55 mg  55 mg Subcutaneous Q24H Emokpae, Ejiroghene E, MD   55 mg at 12/08/23 2209   furosemide (LASIX) tablet 20 mg  20 mg Oral Q M,W,F Emokpae, Ejiroghene E, MD   20 mg at 12/09/23 0901   hydrocortisone (CORTEF) tablet 10 mg  10 mg Oral Q breakfast Emokpae, Ejiroghene E, MD   10 mg at 12/09/23 0900   And   hydrocortisone (CORTEF) tablet 5 mg  5 mg Oral Q lunch Emokpae, Ejiroghene E, MD   5 mg at 12/08/23 1200   HYDROmorphone (DILAUDID) injection 0.75 mg  0.75 mg Intravenous Q4H PRN Johnson, Clanford L, MD   0.75 mg at 12/09/23 4098   hydrOXYzine (ATARAX) tablet 25 mg  25 mg Oral TID PRN Emokpae, Ejiroghene E, MD   25 mg at 12/08/23 2209   ipratropium-albuterol (DUONEB) 0.5-2.5 (3) MG/3ML nebulizer solution 3 mL  3 mL Nebulization TID Lincoln Renshaw, Clanford L, MD   3 mL at 12/09/23 0740   levothyroxine (SYNTHROID) tablet 50 mcg  50 mcg Oral QAC breakfast Emokpae, Ejiroghene E, MD   50 mcg at 12/09/23 0609   lidocaine (LIDODERM) 5 % 2 patch  2 patch Transdermal Q24H Lincoln Renshaw, Clanford L, MD   2 patch at 12/08/23 1203   linaclotide (LINZESS) capsule  290 mcg  290 mcg Oral QAC breakfast Emokpae, Ejiroghene E, MD   290 mcg at 12/09/23 0900   LORazepam (ATIVAN) injection 0.5-1 mg  0.5-1 mg Intravenous Q6H PRN Merrell, David J, MD   1 mg at 12/09/23 1191   midodrine (PROAMATINE) tablet 5 mg  5 mg Oral BID WC Emokpae, Ejiroghene E, MD   5 mg at 12/09/23 0900   ondansetron (ZOFRAN) tablet 4 mg  4 mg Oral Q6H PRN Emokpae, Ejiroghene E, MD       Or   ondansetron (ZOFRAN) injection 4 mg  4 mg Intravenous Q6H PRN Emokpae, Ejiroghene E, MD       Oral care mouth rinse  15 mL Mouth Rinse PRN Johnson, Clanford L, MD       pantoprazole (PROTONIX) EC tablet 40 mg  40 mg Oral BID AC  Emokpae, Ejiroghene E, MD   40 mg at 12/09/23 0900   polyethylene glycol (MIRALAX / GLYCOLAX) packet 17 g  17 g Oral Daily PRN Emokpae, Ejiroghene E, MD       predniSONE (DELTASONE) tablet 5 mg  5 mg Oral q AM Emokpae, Ejiroghene E, MD   5 mg at 12/09/23 1478   rosuvastatin (CRESTOR) tablet 20 mg  20 mg Oral Daily Emokpae, Ejiroghene E, MD   20 mg at 12/09/23 0900   tamsulosin (FLOMAX) capsule 0.4 mg  0.4 mg Oral QPC supper Lincoln Renshaw, Clanford L, MD   0.4 mg at 12/08/23 1703   traZODone (DESYREL) tablet 50 mg  50 mg Oral QHS PRN Merrell, David J, MD         Discharge Medications: Please see discharge summary for a list of discharge medications.  Relevant Imaging Results:  Relevant Lab Results:   Additional Information SSN: [id number] LCSWA

## 2023-12-09 NOTE — Plan of Care (Signed)
  Problem: Education: Goal: Knowledge of General Education information will improve Description: Including pain rating scale, medication(s)/side effects and non-pharmacologic comfort measures Outcome: Progressing   Problem: Health Behavior/Discharge Planning: Goal: Ability to manage health-related needs will improve Outcome: Progressing   Problem: Nutrition: Goal: Adequate nutrition will be maintained Outcome: Progressing   Problem: Coping: Goal: Level of anxiety will decrease Outcome: Not Progressing

## 2023-12-09 NOTE — Progress Notes (Signed)
 PROGRESS NOTE   Kathleen [name]  ZOX:[id number] DOB: 1961-04-17 DOA: 12/07/2023 PCP: [name], MD   Chief Complaint  Patient presents with   Back Pain   Level of care: Med-Surg  Brief Admission History:  63 y.o. female with medical history significant for chronic respiratory failure on 5 L, interstitial lung disease, sarcoidosis, Addison's disease, chronic pain syndrome, anxiety and depression, hypothyroidism.  Patient presented to the ED with complaints of sudden onset of back pain.  Back pain started in December, where she reports a few days ago she stood up to walk and felt a pop in her back with subsequent severe pain.  Pain is worse with movement.  She is unable to ambulate.  No numbness or weakness in her lower extremities.   ED Course: Blood pressure 102/41-137/50.  O2 sats 94 to 100% on 5 L.  MRI lumbar spine shows acute/subacute L4 compression fracture.  EDP talked to neurosurgeon on-call, recommended LSO brace.  Patient unable to void in ED, requiring In-N-Out cath, about 400 mL retained in the bladder. Patient received Dilaudid, dexamethasone 10 mg.  With persistent uncontrolled pain hospitalist was called for admission.   Assessment and Plan:  L4 Compression Fracture  Intractable back pain No falls no trauma.  Patient on chronic steroids.  MRI shows acute/subacute L4 compression fracture.  Admitted for pain control.  Urinary retention likely from uncontrolled pain. -EDP talked to neurosurgery, recommended LSO brace, does not warrant neurosurgical intervention. -IV Dilaudid 0.75 mg every 4 hourly as needed -Foley catheter should be removed 4/14, monitor for voiding -PT/OT evaluation - recommending SNF, TOC consulted for SNF placement  -still awaiting LSO brace to be delivered  Acute Urinary Retention - likely from uncontrolled pain and immobility - foley in place temporarily to be removed today - flomax ordered with supper  - remove foley 4/14 and do a void trial    Adrenal insufficiency (Addison's disease)  - Resume Solu-Cortef and midodrine, also on prednisone, will resume. - Doubt need for stress dose steroids at this time  Pulmonary Fibrosis and Sarcoidosis - she is on daily prednisone for this and seems at baseline at this time  Anxiety and depression Appears anxious likely from uncontrolled pain. -Resume Cymbalta, hydroxyzine, buspirone  Chronic respiratory failure with hypoxia  On 5 L at baseline- Sats currently 94 to 100%  Obstructive sleep apnea Pt says she does not use CPAP but uses nocturnal [place] oxygen supplement  Bilateral lower extremity edema She is a Lasix 20 mg 3 times a week.  1+ pitting bilateral lower extremity edema, mildly worse than baseline.  Weights per chart unchanged.  Last echo 2018 EF 55 to 60%. - IV Lasix 20 mg x 1, continue home Lasix  Hypotension Diastolic on the low side otherwise stable. -Resume midodrine  DVT prophylaxis: lovenox Code Status: Full  Family Communication:  Disposition: plan updated to SNF placement     Consultants:  Neurosurgery consulted 4/12   Procedures:   Antimicrobials:    Subjective: Iv pain meds helping with back pain, she is working with PT but having lots of pain in lower back, LSO brace still not delivered.   Objective: Vitals:   12/08/23 2049 12/09/23 0557 12/09/23 0740 12/09/23 1432  BP: (!) 135/53 (!) 142/61  (!) 118/53  Pulse: 90 88  87  Resp: 16 16  20   Temp: 97.9 F (36.6 C) 97.9 F (36.6 C)  98.5 F (36.9 C)  TempSrc: Oral Oral  Oral  SpO2: 99% 100%  99% 100%  Weight:      Height:        Intake/Output Summary (Last 24 hours) at 12/09/2023 1610 Last data filed at 12/09/2023 1218 Gross per 24 hour  Intake 360 ml  Output 550 ml  Net -190 ml   Filed Weights   12/07/23 1310  Weight: 108.9 kg   Examination:  General exam: Appears calm and uncomfortable lying on side not distressed Respiratory system: Clear to auscultation. Respiratory effort  normal. Cardiovascular system: normal S1 & S2 heard. No JVD, murmurs, rubs, gallops or clicks. No pedal edema. Gastrointestinal system: Abdomen is nondistended, soft and nontender. No organomegaly or masses felt. Normal bowel sounds heard. Central nervous system: Alert and oriented. No focal neurological deficits. Extremities: Symmetric 5 x 5 power. Skin: No rashes, lesions or ulcers. Psychiatry: Judgement and insight appear normal. Mood & affect appropriate.    Data Reviewed: I have personally reviewed following labs and imaging studies  CBC: Recent Labs  Lab 12/07/23 1344  WBC 9.2  HGB 9.7*  HCT 32.8*  MCV 97.3  PLT 393    Basic Metabolic Panel: Recent Labs  Lab 12/07/23 1344  NA 138  K 3.5  CL 97*  CO2 32  GLUCOSE 115*  BUN 13  CREATININE 0.73  CALCIUM 8.4*    CBG: No results for input(s): "GLUCAP" in the last 168 hours.  No results found for this or any previous visit (from the past 240 hours).   Radiology Studies: No results found.  Scheduled Meds:  acetaminophen  650 mg Oral Q6H   Or   acetaminophen  650 mg Rectal Q6H   budesonide  0.5 mg Nebulization BID   busPIRone  15 mg Oral TID   Chlorhexidine Gluconate Cloth  6 each Topical Daily   DULoxetine  60 mg Oral Daily   enoxaparin (LOVENOX) injection  55 mg Subcutaneous Q24H   furosemide  20 mg Oral Q M,W,F   hydrocortisone  10 mg Oral Q breakfast   And   hydrocortisone  5 mg Oral Q lunch   ipratropium-albuterol  3 mL Nebulization BID   levothyroxine  50 mcg Oral QAC breakfast   lidocaine  2 patch Transdermal Q24H   linaclotide  290 mcg Oral QAC breakfast   midodrine  5 mg Oral BID WC   pantoprazole  40 mg Oral BID AC   predniSONE  5 mg Oral q AM   rosuvastatin  20 mg Oral Daily   tamsulosin  0.4 mg Oral QPC supper   Continuous Infusions:   LOS: 1 day   Time spent: 55 mins  Wilfrid Hyser Lincoln Renshaw, MD How to contact the [facility] Attending or Consulting provider 7A - 7P or covering provider during after  hours 7P -7A, for this patient?  Check the care team in [facility] and look for a) attending/consulting TRH provider listed and b) the TRH team listed Log into [email or web address] to find provider on call.  Locate the TRH provider you are looking for under [facility] and page to a number that you can be directly reached. If you still have difficulty reaching the provider, please page the [facility] (Director on Call) for the Hospitalists listed on amion for assistance.  12/09/2023, 4:10 PM

## 2023-12-10 DIAGNOSIS — E271 Primary adrenocortical insufficiency: Secondary | ICD-10-CM | POA: Diagnosis not present

## 2023-12-10 DIAGNOSIS — R600 Localized edema: Secondary | ICD-10-CM | POA: Diagnosis not present

## 2023-12-10 DIAGNOSIS — M549 Dorsalgia, unspecified: Secondary | ICD-10-CM | POA: Diagnosis not present

## 2023-12-10 DIAGNOSIS — J849 Interstitial pulmonary disease, unspecified: Secondary | ICD-10-CM | POA: Diagnosis not present

## 2023-12-10 MED ORDER — POLYETHYLENE GLYCOL 3350 17 G PO PACK
17.0000 g | PACK | Freq: Every day | ORAL | Status: DC
  Filled 2023-12-10: qty 1

## 2023-12-10 MED ORDER — HYDROMORPHONE HCL 1 MG/ML IJ SOLN
0.5000 mg | INTRAMUSCULAR | Status: DC | PRN
  Administered 2023-12-10 – 2023-12-11 (×8): 0.5 mg via INTRAVENOUS
  Filled 2023-12-10 (×8): qty 0.5

## 2023-12-10 MED ORDER — OXYCODONE HCL 5 MG PO TABS
7.5000 mg | ORAL_TABLET | ORAL | Status: DC | PRN
  Administered 2023-12-10 – 2023-12-11 (×5): 7.5 mg via ORAL
  Filled 2023-12-10 (×5): qty 2

## 2023-12-10 NOTE — Progress Notes (Signed)
 PROGRESS NOTE   Kathleen [name]  OZH:[id number] DOB: December 10, 1960 DOA: 12/07/2023 PCP: [name], MD   Chief Complaint  Patient presents with   Back Pain   Level of care: Med-Surg  Brief Admission History:  63 y.o. female with medical history significant for chronic respiratory failure on 5 L, interstitial lung disease, sarcoidosis, Addison's disease, chronic pain syndrome, anxiety and depression, hypothyroidism.  Patient presented to the ED with complaints of sudden onset of back pain.  Back pain started in December, where she reports a few days ago she stood up to walk and felt a pop in her back with subsequent severe pain.  Pain is worse with movement.  She is unable to ambulate.  No numbness or weakness in her lower extremities.   ED Course: Blood pressure 102/41-137/50.  O2 sats 94 to 100% on 5 L.  MRI lumbar spine shows acute/subacute L4 compression fracture.  EDP talked to neurosurgeon on-call, recommended LSO brace.  Patient unable to void in ED, requiring In-N-Out cath, about 400 mL retained in the bladder. Patient received Dilaudid, dexamethasone 10 mg.  With persistent uncontrolled pain hospitalist was called for admission.   Assessment and Plan:  L4 Compression Fracture  Intractable back pain No falls no trauma.  Patient on chronic steroids.  MRI shows acute/subacute L4 compression fracture.  Admitted for pain control.  Urinary retention likely from uncontrolled pain. -EDP talked to neurosurgery, recommended LSO brace, does not warrant neurosurgical intervention. -IV Dilaudid 0.5 mg every 3 hourly as needed; added 2 lidoderm patches to back daily  -Foley catheter removed 4/14, monitor for voiding- she has been voiding freely -PT/OT evaluation - recommending SNF, TOC consulted for SNF placement  -still awaiting LSO brace to be delivered that is the appropriate size, AC notified and contacted ortho tech -continue scheduled laxatives given chronic opioid use  Acute Urinary  Retention - RESOLVED  - likely from uncontrolled pain and immobility - foley in place temporarily to be removed today - flomax ordered with supper  - removed foley 4/14 and now freely voiding   Adrenal insufficiency (Addison's disease)  - Resumed Solu-Cortef and midodrine.  Pulmonary Fibrosis and Sarcoidosis - she is on daily prednisone for this and seems at baseline at this time  Anxiety and depression Appears anxious likely from uncontrolled pain. -Resumed Cymbalta, hydroxyzine, buspirone  Chronic respiratory failure with hypoxia  On 5 L/min [place] at baseline  Obstructive sleep apnea Pt says she does not use CPAP but uses nocturnal nasal cannula oxygen supplement  Bilateral lower extremity edema She is a Lasix 20 mg 3 times a week.  1+ pitting bilateral lower extremity edema, mildly worse than baseline.  Weights per chart unchanged.  Last echo 2018 EF 55 to 60%. - s/p IV Lasix 20 mg x 1, continue home Lasix three times weekly  Hypotension -this is chronic and medically managed with midodrine -Resumed home dose midodrine  DVT prophylaxis: lovenox Code Status: Full  Family Communication:  Disposition: plan updated to SNF placement     Consultants:  Neurosurgery consulted 4/12   Procedures:   Antimicrobials:    Subjective: Pt says she needs more pain relief.  Looks like she was only receiving 1 lidocaine patch to back when she was supposed to be receiving 2 patches.  She also is upset they brought her the wrong size LSO brace that is not large enough given her body habitus.  No loss of bowel or bladder function.   Objective: Vitals:   12/09/23  1432 12/09/23 1955 12/10/23 0737 12/10/23 1414  BP: (!) 118/53 136/60  (!) 144/54  Pulse: 87 (!) 108  93  Resp: 20 20  20   Temp: 98.5 F (36.9 C) 98.2 F (36.8 C)  98 F (36.7 C)  TempSrc: Oral Oral  Oral  SpO2: 100% 99% 99% 100%  Weight:      Height:        Intake/Output Summary (Last 24 hours) at 12/10/2023 1505 Last  data filed at 12/09/2023 1700 Gross per 24 hour  Intake --  Output 400 ml  Net -400 ml   Filed Weights   12/07/23 1310  Weight: 108.9 kg   Examination:  General exam: Pt is sitting up in chair today.  She appears uncomfortable. Not distressed Respiratory system: Clear to auscultation. Respiratory effort normal. Cardiovascular system: normal S1 & S2 heard. No JVD, murmurs, rubs, gallops or clicks. No pedal edema. Gastrointestinal system: Abdomen is nondistended, soft and nontender. No organomegaly or masses felt. Normal bowel sounds heard. Central nervous system: Alert and oriented. No focal neurological deficits. Extremities: Symmetric 5 x 5 power. Skin: No rashes, lesions or ulcers. Psychiatry: Judgement and insight appear normal. Mood & affect appropriate.    Data Reviewed: I have personally reviewed following labs and imaging studies  CBC: Recent Labs  Lab 12/07/23 1344  WBC 9.2  HGB 9.7*  HCT 32.8*  MCV 97.3  PLT 393    Basic Metabolic Panel: Recent Labs  Lab 12/07/23 1344  NA 138  K 3.5  CL 97*  CO2 32  GLUCOSE 115*  BUN 13  CREATININE 0.73  CALCIUM 8.4*    CBG: No results for input(s): "GLUCAP" in the last 168 hours.  No results found for this or any previous visit (from the past 240 hours).   Radiology Studies: No results found.  Scheduled Meds:  acetaminophen  650 mg Oral Q6H   Or   acetaminophen  650 mg Rectal Q6H   budesonide  0.5 mg Nebulization BID   busPIRone  15 mg Oral TID   DULoxetine  60 mg Oral Daily   enoxaparin (LOVENOX) injection  55 mg Subcutaneous Q24H   furosemide  20 mg Oral Q M,W,F   hydrocortisone  10 mg Oral Q breakfast   And   hydrocortisone  5 mg Oral Q lunch   ipratropium-albuterol  3 mL Nebulization BID   levothyroxine  50 mcg Oral QAC breakfast   lidocaine  2 patch Transdermal Q24H   linaclotide  290 mcg Oral QAC breakfast   midodrine  5 mg Oral BID WC   pantoprazole  40 mg Oral BID AC   predniSONE  5 mg Oral q  AM   rosuvastatin  20 mg Oral Daily   tamsulosin  0.4 mg Oral QPC supper   Continuous Infusions:   LOS: 2 days   Time spent: 56 mins  Orlinda Slomski Laural Benes, MD How to contact the [facility] Attending or Consulting provider 7A - 7P or covering provider during after hours 7P -7A, for this patient?  Check the care team in [facility] and look for a) attending/consulting TRH provider listed and b) the [facility] team listed Log into [email or web address] to find provider on call.  Locate the [facility] provider you are looking for under [facility] and page to a number that you can be directly reached. If you still have difficulty reaching the provider, please page the [facility] (Director on Call) for the Hospitalists listed on amion for assistance.  12/10/2023, 3:05 PM

## 2023-12-10 NOTE — TOC Progression Note (Signed)
 Transition of Care [facility]) - Progression Note    Patient Details  Name: Kathleen [name] MRN: [id number] Date of Birth: [birth year]  Transition of Care [facility] CM/SW Contact  Grandville Lax, [place] Phone Number: 12/10/2023, 4:01 PM  Clinical Narrative:    CSW spoke with pt and family at bedside to review bed offers. Pt and family request that CSW reach out to [facility] and [facility] to see if they would re review pts referral and reconsider. CSW awaiting responses at this time. Insurance [employer] has been approved and facility needs to be added. TOC to follow.    Barriers to Discharge: Continued Medical Work up  Expected Discharge Plan and Services       Living arrangements for the past 2 months: Single Family Home                         Representative spoke with at DME Agency: Randel Buss HH Arranged: PT HH Agency: [facility] Date [facility] Agency Contacted: 12/08/23 Time HH Agency Contacted: 0149 Representative spoke with at [facility] Agency: Randel Buss   Social Determinants of Health (SDOH) Interventions SDOH Screenings   Food Insecurity: No Food Insecurity (12/07/2023)  Housing: Low Risk  (12/07/2023)  Transportation Needs: No Transportation Needs (12/07/2023)  Utilities: Not At Risk (12/07/2023)  Alcohol Screen: Low Risk  (09/27/2023)  Depression (PHQ2-9): Low Risk  (10/22/2023)  Financial Resource Strain: Low Risk  (09/27/2023)  Physical Activity: Inactive (09/27/2023)  Social Connections: Moderately Integrated (09/27/2023)  Stress: No Stress Concern Present (09/27/2023)  Tobacco Use: Low Risk  (12/07/2023)  Health Literacy: Adequate Health Literacy (09/27/2023)    Readmission Risk Interventions    04/08/2023   10:38 AM  Readmission Risk Prevention Plan  Transportation Screening Complete  Medication Review (RN Care Manager) Complete  HRI or Home Care Consult Complete  SW Recovery Care/Counseling Consult Complete  Palliative Care Screening Not Applicable  Skilled Nursing Facility Not Applicable

## 2023-12-10 NOTE — Progress Notes (Signed)
 Spoke with Manny ortho tech at [employer] who advised that he would have someone come up and asess pt's and LSO brace in am,

## 2023-12-10 NOTE — Evaluation (Signed)
 Occupational Therapy Evaluation Patient Details Name: Kathleen [name] MRN: [id number] DOB: 05/22/1961 Today's Date: 12/10/2023   History of Present Illness   Kathleen [name] is a 63 y.o. female with medical history significant for chronic respiratory failure on 5 L, interstitial lung disease, sarcoidosis, Addison's disease, chronic pain syndrome, anxiety and depression, hypothyroidism.  Patient presented to the ED with complaints of sudden onset of back pain.  Back pain started in December, where she reports a few days ago she stood up to walk and felt a pop in her back with subsequent severe pain.  Pain is worse with movement.  She is unable to ambulate.  No numbness or weakness in her lower extremities.     Clinical Impressions Pt agreeable to OT and PT co-evaluation. Pt showing signs of significant pain when first sitting at EOB which required mod A. Min to mod A for sit to stand and transfer to chair. Pt able to ambulate in room with min A due to labored effort and difficulty walking backwards to chair with RW. Pt is assisted much for ADL's at baseline. B UE generally weak. Pt left in the chair with call bell within reach. Pt will benefit from continued OT in the hospital and recommended venue below to increase strength, balance, and endurance for safe ADL's.        If plan is discharge home, recommend the following:   A lot of help with walking and/or transfers;A lot of help with bathing/dressing/bathroom;Assistance with cooking/housework;Assist for transportation;Help with stairs or ramp for entrance     Functional Status Assessment   Patient has had a recent decline in their functional status and demonstrates the ability to make significant improvements in function in a reasonable and predictable amount of time.     Equipment Recommendations   None recommended by OT             Precautions/Restrictions   Precautions Precautions: Fall Recall of  Precautions/Restrictions: Intact Precaution/Restrictions Comments: TLSO but current one in room does not fit per PT Restrictions Weight Bearing Restrictions Per Provider Order: No     Mobility Bed Mobility Overal bed mobility: Needs Assistance Bed Mobility: Sidelying to Sit   Sidelying to sit: Mod assist, HOB elevated       General bed mobility comments: labored effort; extended time with increased pain    Transfers Overall transfer level: Needs assistance Equipment used: Rolling walker (2 wheels) Transfers: Sit to/from Stand, Bed to chair/wheelchair/BSC Sit to Stand: Min assist, Mod assist     Step pivot transfers: Min assist, Mod assist     General transfer comment: labored movement; increased pain      Balance Overall balance assessment: Needs assistance Sitting-balance support: Feet supported, No upper extremity supported Sitting balance-Leahy Scale: Fair Sitting balance - Comments: fair/good seated at EOB   Standing balance support: Reliant on assistive device for balance, During functional activity, Bilateral upper extremity supported Standing balance-Leahy Scale: Poor Standing balance comment: fair/poor using RW                           ADL either performed or assessed with clinical judgement   ADL Overall ADL's : Needs assistance/impaired     Grooming: Set up;Sitting       Lower Body Bathing: Total assistance;Maximal assistance;Sitting/lateral leans   Upper Body Dressing : Set up;Sitting   Lower Body Dressing: Maximal assistance;Total assistance;Sitting/lateral leans   Toilet Transfer: Moderate assistance;Minimal assistance;Stand-pivot;Ambulation;Rolling walker (2 wheels)  Toilet Transfer Details (indicate cue type and reason): simualted via EOB to chair and short distance ambulation in the room. Toileting- Clothing Manipulation and Hygiene: Maximal assistance;Sitting/lateral lean       Functional mobility during ADLs: Minimal  assistance;Moderate assistance;Rolling walker (2 wheels)       Vision Baseline Vision/History: 1 Wears glasses Ability to See in Adequate Light: 1 Impaired Patient Visual Report: No change from baseline Vision Assessment?: Wears glasses for reading     Perception Perception: Not tested       Praxis Praxis: Not tested       Pertinent Vitals/Pain Pain Assessment Pain Assessment: Faces Faces Pain Scale: Hurts whole lot Pain Location: low back with movement Pain Descriptors / Indicators: Grimacing, Guarding, Sharp, Moaning Pain Intervention(s): Limited activity within patient's tolerance, Monitored during session, Repositioned, Premedicated before session     Extremity/Trunk Assessment Upper Extremity Assessment Upper Extremity Assessment: Generalized weakness   Lower Extremity Assessment Lower Extremity Assessment: Defer to PT evaluation   Cervical / Trunk Assessment Cervical / Trunk Assessment: Normal   Communication Communication Communication: No apparent difficulties   Cognition Arousal: Alert Behavior During Therapy: WFL for tasks assessed/performed Cognition: No apparent impairments                               Following commands: Intact       Cueing  General Comments   Cueing Techniques: Verbal cues                 Home Living Family/patient expects to be discharged to:: Private residence Living Arrangements: Spouse/significant other Available Help at Discharge: Family;Available PRN/intermittently Type of Home: House Home Access: Stairs to enter [employer] of Steps: 1 Entrance Stairs-Rails: None Home Layout: One level     Bathroom Shower/Tub: Chief Strategy Officer: Handicapped height Bathroom Accessibility: Yes   Home Equipment: Agricultural consultant (2 wheels);Electric scooter;Shower seat;Hand held shower head   Additional Comments: per PT note. Pt states she needs a BSC because the one she has in  unsteady.      Prior Functioning/Environment Prior Level of Function : History of Falls (last six months);Needs assist       Physical Assist : ADLs (physical)   ADLs (physical): Dressing;Bathing;Toileting;IADLs Mobility Comments: RW and scooter ADLs Comments: Husband assist with most ADL's pt able to feed self and comb hair.    OT Problem List: Decreased strength;Decreased activity tolerance;Pain   OT Treatment/Interventions: Self-care/ADL training;Therapeutic exercise;DME and/or AE instruction;Therapeutic activities;Balance training;Patient/family education      OT Goals(Current goals can be found in the care plan section)   Acute Rehab OT Goals Patient Stated Goal: go to [facility] OT Goal Formulation: With patient Time For Goal Achievement: 12/24/23 Potential to Achieve Goals: Good   OT Frequency:  Min 2X/week    Co-evaluation PT/OT/SLP Co-Evaluation/Treatment: Yes Reason for Co-Treatment: To address functional/ADL transfers   OT goals addressed during session: ADL's and self-care                       End of Session Equipment Utilized During Treatment: Rolling walker (2 wheels);Gait belt Nurse Communication: Mobility status  Activity Tolerance: Patient tolerated treatment well Patient left: in chair;with call bell/phone within reach  OT Visit Diagnosis: Unsteadiness on feet (R26.81);Other abnormalities of gait and mobility (R26.89);Muscle weakness (generalized) (M62.81)                Time:  1610-9604 OT Time Calculation (min): 14 min Charges:  OT General Charges $OT Visit: 1 Visit OT Evaluation $OT Eval Low Complexity: 1 Low  Kuron Docken OT, MOT  Thurnell Floss 12/10/2023, 9:54 AM

## 2023-12-10 NOTE — Plan of Care (Signed)
  Problem: Acute Rehab OT Goals (only OT should resolve) Goal: Pt. Will Perform Lower Body Bathing Flowsheets (Taken 12/10/2023 0956) Pt Will Perform Lower Body Bathing:  with min assist  sitting/lateral leans Goal: Pt. Will Perform Lower Body Dressing Flowsheets (Taken 12/10/2023 0956) Pt Will Perform Lower Body Dressing:  with min assist  sitting/lateral leans Goal: Pt. Will Perform Toileting-Clothing Manipulation Flowsheets (Taken 12/10/2023 0956) Pt Will Perform Toileting - Clothing Manipulation and hygiene:  with min assist  sitting/lateral leans Goal: Pt/Caregiver Will Perform Home Exercise Program Flowsheets (Taken 12/10/2023 [phone]) Pt/caregiver will Perform Home Exercise Program:  Increased strength  Both right and left upper extremity  Independently  Pt will demonstrate ability to transfer to toilet with CGA using RW.

## 2023-12-10 NOTE — Progress Notes (Signed)
 Orthopedic Tech Progress Note Patient Details:  Kathleen [name] 1960-12-23 [id number]  Patient ID: Kathleen [name], female   DOB: 1960-11-01, 62 y.o.   MRN: [id number] I called [employer] to get a new brace at rn's request. Terryann Fiddler 12/10/2023, 6:33 AM

## 2023-12-10 NOTE — Progress Notes (Signed)
 Physical Therapy Treatment Patient Details Name: Kathleen [name] MRN: [id number] DOB: 09-10-1960 Today's Date: 12/10/2023   History of Present Illness Kathleen [name] is a 63 y.o. female with medical history significant for chronic respiratory failure on 5 L, interstitial lung disease, sarcoidosis, Addison's disease, chronic pain syndrome, anxiety and depression, hypothyroidism.  Patient presented to the ED with complaints of sudden onset of back pain.  Back pain started in December, where she reports a few days ago she stood up to walk and felt a pop in her back with subsequent severe pain.  Pain is worse with movement.  She is unable to ambulate.  No numbness or weakness in her lower extremities.    PT Comments  Patient demonstrated improvement for sitting up from side lying position using bed rail with HOB raised and increased endurance/distance for taking steps forward/backwards at bedside before having to sit due to c/o fatigue and increasing low back pain. Patient tolerated sitting up in chair after therapy - RN notified. Patient will benefit from continued skilled physical therapy in hospital and recommended venue below to increase strength, balance, endurance for safe ADLs and gait.      If plan is discharge home, recommend the following: A lot of help with walking and/or transfers;A lot of help with bathing/dressing/bathroom;Help with stairs or ramp for entrance;Assistance with cooking/housework   Can travel by private vehicle     No  Equipment Recommendations  None recommended by PT    Recommendations for Other Services       Precautions / Restrictions Precautions Precautions: Fall Recall of Precautions/Restrictions: Intact Precaution/Restrictions Comments: TLSO but current one in room does not fit per PT Restrictions Weight Bearing Restrictions Per Provider Order: No     Mobility  Bed Mobility Overal bed mobility: Needs Assistance Bed Mobility: Sidelying to Sit    Sidelying to sit: Mod assist, HOB elevated       General bed mobility comments: increased time, labord movement, frequent verbal cueing    Transfers Overall transfer level: Needs assistance Equipment used: Rolling walker (2 wheels) Transfers: Sit to/from Stand, Bed to chair/wheelchair/BSC Sit to Stand: Min assist, Mod assist   Step pivot transfers: Min assist, Mod assist       General transfer comment: increased BLE strength for completing sit to stands    Ambulation/Gait Ambulation/Gait assistance: Mod assist Gait Distance (Feet): 12 Feet Assistive device: Rolling walker (2 wheels) Gait Pattern/deviations: Decreased step length - left, Decreased step length - right, Decreased stride length Gait velocity: slow     General Gait Details: increased endurance/distance for taking steps forward/backwards at bedside, but limited mostly due to c/o low back pain   Stairs             Wheelchair Mobility     Tilt Bed    Modified Rankin (Stroke Patients Only)       Balance Overall balance assessment: Needs assistance Sitting-balance support: Feet supported, No upper extremity supported Sitting balance-Leahy Scale: Fair Sitting balance - Comments: fair/good seated at EOB   Standing balance support: Reliant on assistive device for balance, During functional activity, Bilateral upper extremity supported Standing balance-Leahy Scale: Poor Standing balance comment: fair/poor using RW                            Communication Communication Communication: No apparent difficulties  Cognition Arousal: Alert Behavior During Therapy: WFL for tasks assessed/performed   PT - Cognitive impairments: No apparent impairments  Following commands: Intact      Cueing Cueing Techniques: Verbal cues  Exercises      General Comments        Pertinent Vitals/Pain Pain Assessment Pain Assessment: Faces Pain Score: 8  Pain  Location: low back with movement Pain Descriptors / Indicators: Grimacing, Guarding, Sharp, Moaning Pain Intervention(s): Limited activity within patient's tolerance, Monitored during session, Premedicated before session, Repositioned    Home Living Family/patient expects to be discharged to:: Private residence Living Arrangements: Spouse/significant other Available Help at Discharge: Family;Available PRN/intermittently Type of Home: House Home Access: Stairs to enter Entrance Stairs-Rails: None Entrance Stairs-Number of Steps: 1   Home Layout: One level Home Equipment: Agricultural consultant (2 wheels);Electric scooter;Shower seat;Hand held shower head Additional Comments: per PT note. Pt states she needs a BSC because the one she has in unsteady.    Prior Function            PT Goals (current goals can now be found in the care plan section) Acute Rehab PT Goals Patient Stated Goal: return home PT Goal Formulation: With patient Time For Goal Achievement: 12/22/23 Potential to Achieve Goals: Good Progress towards PT goals: Progressing toward goals    Frequency    Min 3X/week      PT Plan      Co-evaluation PT/OT/SLP Co-Evaluation/Treatment: Yes Reason for Co-Treatment: To address functional/ADL transfers PT goals addressed during session: Mobility/safety with mobility;Balance;Proper use of DME OT goals addressed during session: ADL's and self-care      AM-PAC PT "6 Clicks" Mobility   Outcome Measure  Help needed turning from your back to your side while in a flat bed without using bedrails?: A Lot Help needed moving from lying on your back to sitting on the side of a flat bed without using bedrails?: A Lot Help needed moving to and from a bed to a chair (including a wheelchair)?: A Lot Help needed standing up from a chair using your arms (e.g., wheelchair or bedside chair)?: A Lot Help needed to walk in hospital room?: A Lot Help needed climbing 3-5 steps with a  railing? : Total 6 Click Score: 11    End of Session Equipment Utilized During Treatment: Oxygen Activity Tolerance: Patient tolerated treatment well;Patient limited by fatigue;Patient limited by pain Patient left: in chair;with call bell/phone within reach Nurse Communication: Mobility status PT Visit Diagnosis: Unsteadiness on feet (R26.81);Muscle weakness (generalized) (M62.81);Other abnormalities of gait and mobility (R26.89)     Time: 5784-6962 PT Time Calculation (min) (ACUTE ONLY): 14 min  Charges:    $Therapeutic Activity: 8-22 mins PT General Charges $$ ACUTE PT VISIT: 1 Visit                     12:34 PM, 12/10/23 Walton Guppy, MPT Physical Therapist with [facility] 336 [phone] office [phone] mobile phone

## 2023-12-11 DIAGNOSIS — J849 Interstitial pulmonary disease, unspecified: Secondary | ICD-10-CM | POA: Diagnosis not present

## 2023-12-11 DIAGNOSIS — M797 Fibromyalgia: Secondary | ICD-10-CM | POA: Diagnosis not present

## 2023-12-11 DIAGNOSIS — K219 Gastro-esophageal reflux disease without esophagitis: Secondary | ICD-10-CM | POA: Diagnosis not present

## 2023-12-11 DIAGNOSIS — G894 Chronic pain syndrome: Secondary | ICD-10-CM | POA: Diagnosis not present

## 2023-12-11 DIAGNOSIS — S32040D Wedge compression fracture of fourth lumbar vertebra, subsequent encounter for fracture with routine healing: Secondary | ICD-10-CM | POA: Diagnosis not present

## 2023-12-11 DIAGNOSIS — M25552 Pain in left hip: Secondary | ICD-10-CM | POA: Diagnosis not present

## 2023-12-11 DIAGNOSIS — E038 Other specified hypothyroidism: Secondary | ICD-10-CM | POA: Diagnosis not present

## 2023-12-11 DIAGNOSIS — E039 Hypothyroidism, unspecified: Secondary | ICD-10-CM | POA: Diagnosis not present

## 2023-12-11 DIAGNOSIS — R338 Other retention of urine: Secondary | ICD-10-CM | POA: Diagnosis not present

## 2023-12-11 DIAGNOSIS — D869 Sarcoidosis, unspecified: Secondary | ICD-10-CM | POA: Diagnosis not present

## 2023-12-11 DIAGNOSIS — E271 Primary adrenocortical insufficiency: Secondary | ICD-10-CM | POA: Diagnosis not present

## 2023-12-11 DIAGNOSIS — I739 Peripheral vascular disease, unspecified: Secondary | ICD-10-CM | POA: Diagnosis not present

## 2023-12-11 DIAGNOSIS — D649 Anemia, unspecified: Secondary | ICD-10-CM | POA: Diagnosis not present

## 2023-12-11 DIAGNOSIS — M87052 Idiopathic aseptic necrosis of left femur: Secondary | ICD-10-CM | POA: Diagnosis not present

## 2023-12-11 DIAGNOSIS — R0602 Shortness of breath: Secondary | ICD-10-CM | POA: Diagnosis not present

## 2023-12-11 DIAGNOSIS — R600 Localized edema: Secondary | ICD-10-CM | POA: Diagnosis not present

## 2023-12-11 DIAGNOSIS — F32A Depression, unspecified: Secondary | ICD-10-CM | POA: Diagnosis not present

## 2023-12-11 DIAGNOSIS — Z9981 Dependence on supplemental oxygen: Secondary | ICD-10-CM | POA: Diagnosis not present

## 2023-12-11 DIAGNOSIS — G4733 Obstructive sleep apnea (adult) (pediatric): Secondary | ICD-10-CM | POA: Diagnosis not present

## 2023-12-11 DIAGNOSIS — I959 Hypotension, unspecified: Secondary | ICD-10-CM | POA: Diagnosis not present

## 2023-12-11 DIAGNOSIS — D860 Sarcoidosis of lung: Secondary | ICD-10-CM | POA: Diagnosis not present

## 2023-12-11 DIAGNOSIS — E785 Hyperlipidemia, unspecified: Secondary | ICD-10-CM | POA: Diagnosis not present

## 2023-12-11 DIAGNOSIS — F419 Anxiety disorder, unspecified: Secondary | ICD-10-CM | POA: Diagnosis not present

## 2023-12-11 DIAGNOSIS — G8929 Other chronic pain: Secondary | ICD-10-CM | POA: Diagnosis not present

## 2023-12-11 DIAGNOSIS — R262 Difficulty in walking, not elsewhere classified: Secondary | ICD-10-CM | POA: Diagnosis not present

## 2023-12-11 DIAGNOSIS — R52 Pain, unspecified: Secondary | ICD-10-CM | POA: Diagnosis not present

## 2023-12-11 DIAGNOSIS — Z79899 Other long term (current) drug therapy: Secondary | ICD-10-CM | POA: Diagnosis not present

## 2023-12-11 DIAGNOSIS — J8410 Pulmonary fibrosis, unspecified: Secondary | ICD-10-CM | POA: Diagnosis not present

## 2023-12-11 DIAGNOSIS — S32040A Wedge compression fracture of fourth lumbar vertebra, initial encounter for closed fracture: Secondary | ICD-10-CM | POA: Diagnosis not present

## 2023-12-11 DIAGNOSIS — K5900 Constipation, unspecified: Secondary | ICD-10-CM | POA: Diagnosis not present

## 2023-12-11 DIAGNOSIS — F411 Generalized anxiety disorder: Secondary | ICD-10-CM | POA: Diagnosis not present

## 2023-12-11 DIAGNOSIS — R112 Nausea with vomiting, unspecified: Secondary | ICD-10-CM | POA: Diagnosis not present

## 2023-12-11 DIAGNOSIS — J9611 Chronic respiratory failure with hypoxia: Secondary | ICD-10-CM | POA: Diagnosis not present

## 2023-12-11 DIAGNOSIS — M549 Dorsalgia, unspecified: Secondary | ICD-10-CM | POA: Diagnosis not present

## 2023-12-11 DIAGNOSIS — J9691 Respiratory failure, unspecified with hypoxia: Secondary | ICD-10-CM | POA: Diagnosis not present

## 2023-12-11 DIAGNOSIS — M5459 Other low back pain: Secondary | ICD-10-CM | POA: Diagnosis not present

## 2023-12-11 LAB — BASIC METABOLIC PANEL WITH GFR
Anion gap: 6 (ref 5–15)
BUN: 16 mg/dL (ref 8–23)
CO2: 29 mmol/L (ref 22–32)
Calcium: 8.8 mg/dL — ABNORMAL LOW (ref 8.9–10.3)
Chloride: 103 mmol/L (ref 98–111)
Creatinine, Ser: 0.58 mg/dL (ref 0.44–1.00)
GFR, Estimated: >60 mL/min (ref >60)
Glucose, Bld: 122 mg/dL — ABNORMAL HIGH (ref 70–99)
Potassium: 3.6 mmol/L (ref 3.5–5.1)
Sodium: 138 mmol/L (ref 135–145)

## 2023-12-11 LAB — CBC
HCT: 31.5 % — ABNORMAL LOW (ref 36.0–46.0)
Hemoglobin: 9.2 g/dL — ABNORMAL LOW (ref 12.0–15.0)
MCH: 28.8 pg (ref 26.0–34.0)
MCHC: 29.2 g/dL — ABNORMAL LOW (ref 30.0–36.0)
MCV: 98.7 fL (ref 80.0–100.0)
Platelets: 366 10*3/uL (ref 150–400)
RBC: 3.19 MIL/uL — ABNORMAL LOW (ref 3.87–5.11)
RDW: 15.9 % — ABNORMAL HIGH (ref 11.5–15.5)
WBC: 9.1 10*3/uL (ref 4.0–10.5)
nRBC: 0.4 % — ABNORMAL HIGH (ref 0.0–0.2)

## 2023-12-11 MED ORDER — HYDROMORPHONE HCL 2 MG PO TABS: 2.0000 mg | ORAL_TABLET | ORAL | 0 refills | Status: DC | PRN

## 2023-12-11 MED ORDER — TAMSULOSIN HCL 0.4 MG PO CAPS: 0.4000 mg | ORAL_CAPSULE | Freq: Every day | ORAL | Status: DC

## 2023-12-11 MED ORDER — HYDROMORPHONE HCL 2 MG PO TABS
2.0000 mg | ORAL_TABLET | ORAL | Status: DC | PRN
  Administered 2023-12-11: 2 mg via ORAL
  Filled 2023-12-11: qty 1

## 2023-12-11 MED ORDER — ACETAMINOPHEN 325 MG PO TABS
650.0000 mg | ORAL_TABLET | Freq: Four times a day (QID) | ORAL | Status: DC
Start: 2023-12-11 — End: 2024-01-03

## 2023-12-11 NOTE — TOC Transition Note (Signed)
 Transition of Care [facility]) - Discharge Note   Patient Details  Name: Kathleen [name] MRN: [id number] Date of Birth: 03-24-1961  Transition of [facility]) CM/SW Contact:  Grandville Lax, LCSWA Phone Number: 12/11/2023, 11:08 AM   Clinical Narrative:    CSW updated that [facility] is able to accept pt. CSW spoke with pt who is agreeable to this. Insurance auth updated of facility choice. CSW spoke to [facility] who states they can accept pt today. RN provided with room and report numbers. D/C clinicals sent to facility via HUB. Med necessity printed to floor for RN. EMS called for transport. TOC signing off.   Final next level of care: Skilled Nursing Facility Barriers to Discharge: Barriers Resolved   Patient Goals and CMS Choice Patient states their goals for this hospitalization and ongoing recovery are:: Go to SNF CMS Medicare.gov Compare Post Acute Care list provided to:: Patient Choice offered to / list presented to : Patient, Spouse [place] ownership interest in [facility].provided to:: Patient    Discharge Placement                Patient to be transferred to facility by: EMS Name of family member notified: Husband Patient and family notified of of transfer: 12/11/23  Discharge Plan and Services Additional resources added to the After Visit Summary for                          Representative spoke with at DME Agency: Randel Buss HH Arranged: PT [facility] Agency: [facility] Date [facility] Agency Contacted: 12/08/23 Time HH Agency Contacted: [phone] Representative spoke with at [facility] Agency: Randel Buss  Social Drivers of Health (SDOH) Interventions SDOH Screenings   Food Insecurity: No Food Insecurity (12/07/2023)  Housing: Low Risk  (12/07/2023)  Transportation Needs: No Transportation Needs (12/07/2023)  Utilities: Not At Risk (12/07/2023)  Alcohol Screen: Low Risk  (09/27/2023)  Depression (PHQ2-9): Low Risk  (10/22/2023)  Financial Resource Strain: Low Risk  (09/27/2023)   Physical Activity: Inactive (09/27/2023)  Social Connections: Moderately Integrated (09/27/2023)  Stress: No Stress Concern Present (09/27/2023)  Tobacco Use: Low Risk  (12/07/2023)  Health Literacy: Adequate Health Literacy (09/27/2023)     Readmission Risk Interventions    04/08/2023   10:38 AM  Readmission Risk Prevention Plan  Transportation Screening Complete  Medication Review (RN Care Manager) Complete  HRI or Home Care Consult Complete  SW Recovery Care/Counseling Consult Complete  Palliative Care Screening Not Applicable  Skilled Nursing Facility Not Applicable

## 2023-12-11 NOTE — Progress Notes (Signed)
 Pt continuously c/o lower back pain and hip pain. She requests pain medication often, sometimes 1 hour post administration of dilaudid. Vital signs remained within normal limits. She remained on 5-6 L [place].  NO acute events overnight. Kellogg RN

## 2023-12-11 NOTE — Discharge Summary (Signed)
 Physician Discharge Summary   Patient: Kathleen [name] MRN: [id number] DOB: 01-30-1961  Admit date:     12/07/2023  Discharge date: 12/11/23  Discharge Physician: Myrtie Atkinson Tienna Bienkowski   PCP: Bennet Brasil, MD   Recommendations at discharge:  Please follow up with primary care provider within 1-2 weeks  Please repeat BMP and CBC in one week   63 y.o. female with medical history significant for chronic respiratory failure on 5 L, interstitial lung disease, sarcoidosis, Addison's disease, chronic pain syndrome, anxiety and depression, hypothyroidism.  Patient presented to the ED with complaints of sudden onset of back pain.  Back pain started in December, where she reports a few days ago she stood up to walk and felt a pop in her back with subsequent severe pain.  Pain is worse with movement.  She is unable to ambulate.  No numbness or weakness in her lower extremities.   ED Course: Blood pressure 102/41-137/50.  O2 sats 94 to 100% on 5 L.  MRI lumbar spine shows acute/subacute L4 compression fracture.  EDP talked to neurosurgeon on-call, recommended LSO brace.  Patient unable to void in ED, requiring In-N-Out cath, about 400 mL retained in the bladder. Patient received Dilaudid, dexamethasone 10 mg.  With persistent uncontrolled pain hospitalist was called for admission.  No red flags noted on MR of lumbar spine.  Patient was treated with IV dilaudid and po oxycodone.  She felt the oxycodone was not controlling her pain, so she will transition to po dilaudid at time of d/c.  Assessment and Plan: L4 Compression Fracture  Intractable back pain No falls no trauma.  Patient on chronic steroids.  MRI shows acute/subacute L4 compression fracture.  Admitted for pain control.  Urinary retention likely from uncontrolled pain. -EDP talked to neurosurgery, recommended LSO brace, does not warrant neurosurgical intervention. -IV Dilaudid 0.5 mg every 3 hourly as needed; added 2 lidoderm patches to back daily  -Foley  catheter removed 4/14, monitor for voiding- she has been voiding freely -PT/OT evaluation - recommending SNF, TOC consulted for SNF placement  -LSO brace to be delivered that is the appropriate size, AC notified and contacted ortho tech -continue scheduled laxatives given chronic opioid use -pt felt that po oxycodone not controlling pain so pt will d/c with po dilaudid   Acute Urinary Retention - RESOLVED  - likely from uncontrolled pain and immobility - foley in place temporarily to be removed today - flomax ordered with supper  - removed foley 4/14 and now freely voiding    Adrenal insufficiency (Addison's disease)  - Resumed Solu-Cortef and midodrine.   Pulmonary Fibrosis and Sarcoidosis - she is on daily prednisone for this and seems at baseline at this time - out pt follow up with pulmonary   Anxiety and depression Appears anxious likely from uncontrolled pain. -Resumed Cymbalta, hydroxyzine, buspirone   Chronic respiratory failure with hypoxia  On 5 L/min [place] at baseline -stable   Obstructive sleep apnea Pt says she does not use CPAP but uses nocturnal nasal cannula oxygen supplement   Bilateral lower extremity edema She is a Lasix 20 mg 3 times a week.  1+ pitting bilateral lower extremity edema, mildly worse than baseline.  Weights per chart unchanged.  Last echo 2018 EF 55 to 60%. - s/p IV Lasix 20 mg x 1, continue home Lasix three times weekly   Hypotension -this is chronic and medically managed with midodrine -Resumed home dose midodrine -no hypotension during hospitalization      Consultants:  none Procedures performed: none  Disposition: Skilled nursing facility Diet recommendation:  Regular diet DISCHARGE MEDICATION: Allergies as of 12/11/2023       Reactions   Bee Venom Anaphylaxis   Contrast Media [iodinated Contrast Media]    CT chest w/ contrast at OSH followed by SOB resolved w/ single dose steroids, never ENT swelling, intubation or pressors, has  had multiple contrasted scans since   Bactrim [sulfamethoxazole-trimethoprim] Nausea And Vomiting   Codeine Itching   Other reaction(s): ITCHING        Medication List     STOP taking these medications    oxyCODONE-acetaminophen 10-325 MG tablet Commonly known as: PERCOCET   Wegovy 0.25 MG/0.5ML Soaj Generic drug: Semaglutide-Weight Management       TAKE these medications    acetaminophen 325 MG tablet Commonly known as: TYLENOL Take 2 tablets (650 mg total) by mouth every 6 (six) hours.   albuterol 108 (90 Base) MCG/ACT inhaler Commonly known as: VENTOLIN HFA Inhale 2 puffs into the lungs every 6 (six) hours as needed for wheezing or shortness of breath.   albuterol (2.5 MG/3ML) 0.083% nebulizer solution Commonly known as: PROVENTIL Take 3 mLs (2.5 mg total) by nebulization every 4 (four) hours as needed for wheezing or shortness of breath.   budesonide 0.5 MG/2ML nebulizer solution Commonly known as: PULMICORT Inhale the contents of 1 vial via nebulizer two (2) times a day.   busPIRone 15 MG tablet Commonly known as: BUSPAR Take 1 tablet (15 mg) by mouth 3 times daily.   calcitonin (salmon) 200 UNIT/ACT nasal spray Commonly known as: Miacalcin Use 1 spray in one nostril per day, alternate nostrils every other day (may have 1 vial she should use this for at least 2 weeks but may use as long as 4 weeks)   DULoxetine 60 MG capsule Commonly known as: Cymbalta Take 1 capsule (60 mg total) by mouth daily.   EPINEPHrine 0.3 mg/0.3 mL Soaj injection Commonly known as: EPI-PEN Inject 0.3 mg into the muscle as needed for anaphylaxis.   ferrous sulfate 325 (65 FE) MG EC tablet Take 325 mg by mouth once a week.   furosemide 20 MG tablet Commonly known as: LASIX Take 1 tablet (20 mg total) by mouth every Monday, Wednesday, and Friday.   hydrocortisone 10 MG tablet Commonly known as: CORTEF Take 2 tablets by mouth at 8:00 am and 1 tablet at lunch. (May double dose  on sick days x 3-5 days)   HYDROmorphone 2 MG tablet Commonly known as: DILAUDID Take 1 tablet (2 mg total) by mouth every 4 (four) hours as needed for severe pain (pain score 7-10).   hydrOXYzine 25 MG tablet Commonly known as: ATARAX Take 1 tablet (25 mg total) by mouth 3 (three) times daily as needed for anxiety.   ipratropium-albuterol 0.5-2.5 (3) MG/3ML Soln Commonly known as: DUONEB Take 3 mLs by nebulization every 8 (eight) hours.   levothyroxine 50 MCG tablet Commonly known as: SYNTHROID Take 1 tablet by mouth daily before breakfast.   lidocaine 5 % Commonly known as: LIDODERM Place 1 patch onto the skin daily. Remove & Discard patch within 12 hours or as directed by MD   Linzess 290 MCG Caps capsule Generic drug: linaclotide Take 1 capsule (290 mcg total) by mouth daily before breakfast.   midodrine 5 MG tablet Commonly known as: PROAMATINE Take 1 tablet (5 mg total) by mouth 2 (two) times daily with a meal.   naloxone 4 MG/0.1ML Liqd nasal spray kit  Commonly known as: NARCAN Place 1 spray into the nose as needed.   ondansetron 8 MG disintegrating tablet Commonly known as: ZOFRAN-ODT Dissolve 1 tablet (8 mg total) by mouth every 8 (eight) hours as needed.   OXYGEN Inhale 5 L into the lungs continuous.   pantoprazole 40 MG tablet Commonly known as: PROTONIX Take 1 tablet (40 mg total) by mouth 2 (two) times daily before a meal.   predniSONE 5 MG tablet Commonly known as: DELTASONE Take 1 tablet (5 mg total) by mouth in the morning.   rosuvastatin 20 MG tablet Commonly known as: Crestor Take 1 tablet (20 mg total) by mouth daily.   senna-docusate 8.6-50 MG tablet Commonly known as: Senokot-S Take 1 tablet by mouth 2 (two) times daily between meals as needed for mild constipation.   SOLU-Medrol (PF) 125 MG injection ACT-O-VIAL Generic drug: methylPREDNISolone sodium succinate Administer as directed.  Giving IV is preferred, but can also be given IM if  IV access is difficult.   tamsulosin 0.4 MG Caps capsule Commonly known as: FLOMAX Take 1 capsule (0.4 mg total) by mouth daily after supper.        Follow-up Information     [facility] Follow up.   Specialty: Home Health Services Why: Agency will call for  home visit appointment. Contact information: [address] [place] [address] [phone]                Discharge Exam: Filed Weights   12/07/23 1310  Weight: 108.9 kg   HEENT:  [place]/AT, No thrush, no icterus CV:  RRR, no rub, no S3, no S4 Lung:  bibasilar rales. No wheeze Abd:  soft/+BS, NT Ext:  trace LE edema, no lymphangitis, no synovitis, no rash   Condition at discharge: stable  The results of significant diagnostics from this hospitalization (including imaging, microbiology, ancillary and laboratory) are listed below for reference.   Imaging Studies: MR Lumbar Spine W Wo Contrast Result Date: 12/07/2023 CLINICAL DATA:  Low back pain, cauda equina syndrome suspected. Incontinence. EXAM: MRI LUMBAR SPINE WITHOUT AND WITH CONTRAST TECHNIQUE: Multiplanar and multiecho pulse sequences of the lumbar spine were obtained without and with intravenous contrast. CONTRAST:  10mL GADAVIST GADOBUTROL 1 MMOL/ML IV SOLN COMPARISON:  MRI lumbar spine 01/16/2021. CT abdomen and pelvis 08/05/2023. FINDINGS: Segmentation: Mildly transitional L5 with left-sided L5-S1 assimilation joint. Alignment:  Normal. Vertebrae: New L4 superior endplate compression fracture with 20% vertebral body height loss and moderate marrow edema. No significant retropulsion. No suspicious marrow lesion. Conus medullaris and cauda equina: Conus extends to the L1 level. Conus and cauda equina appear normal. Paraspinal and other soft tissues: Unremarkable. Disc levels: T12-L1 through L2-3: Disc desiccation. No disc herniation or stenosis. L3-4: Disc desiccation. Minimal disc bulging and mild facet hypertrophy without stenosis,  unchanged from the prior MRI. L4-5: Disc desiccation. Minimal disc bulging, stable to slightly increased. Mild facet hypertrophy. No stenosis. L5-S1: Transitional anatomy. Effacement of the thecal sac by epidural fat. No disc herniation or stenosis. IMPRESSION: 1. Acute/subacute L4 compression fracture. 2. Mild lumbar spondylosis without stenosis. Electronically Signed   By: Sebastian Ache M.D.   On: 12/07/2023 18:10    Microbiology: Results for orders placed or performed during the hospital encounter of 08/05/23  Resp panel by RT-PCR (RSV, Flu A&B, Covid) Anterior Nasal Swab     Status: None   Collection Time: 08/05/23 10:49 AM   Specimen: Anterior Nasal Swab  Result Value Ref Range Status  SARS Coronavirus 2 by RT PCR NEGATIVE NEGATIVE Final    Comment: (NOTE) SARS-CoV-2 target nucleic acids are NOT DETECTED.  The SARS-CoV-2 RNA is generally detectable in upper respiratory specimens during the acute phase of infection. The lowest concentration of SARS-CoV-2 viral copies this assay can detect is 138 copies/mL. A negative result does not preclude SARS-Cov-2 infection and should not be used as the sole basis for treatment or other patient management decisions. A negative result may occur with  improper specimen collection/handling, submission of specimen other than nasopharyngeal swab, presence of viral mutation(s) within the areas targeted by this assay, and inadequate number of viral copies(<138 copies/mL). A negative result must be combined with clinical observations, patient history, and epidemiological information. The expected result is Negative.  Fact Sheet for Patients:  BloggerCourse.com  Fact Sheet for Healthcare Providers:  SeriousBroker.it  This test is no t yet approved or cleared by the [place] FDA and  has been authorized for detection and/or diagnosis of SARS-CoV-2 by FDA under an Emergency Use Authorization (EUA).  This EUA will remain  in effect (meaning this test can be used) for the duration of the COVID-19 declaration under Section 564(b)(1) of the Act, 21 U.S.C.section 360bbb-3(b)(1), unless the authorization is terminated  or revoked sooner.       Influenza A by PCR NEGATIVE NEGATIVE Final   Influenza B by PCR NEGATIVE NEGATIVE Final    Comment: (NOTE) The Xpert Xpress SARS-CoV-2/FLU/RSV plus assay is intended as an aid in the diagnosis of influenza from Nasopharyngeal swab specimens and should not be used as a sole basis for treatment. Nasal washings and aspirates are unacceptable for Xpert Xpress SARS-CoV-2/FLU/RSV testing.  Fact Sheet for Patients: BloggerCourse.com  Fact Sheet for Healthcare Providers: SeriousBroker.it  This test is not yet approved or cleared by the [place] FDA and has been authorized for detection and/or diagnosis of SARS-CoV-2 by FDA under an Emergency Use Authorization (EUA). This EUA will remain in effect (meaning this test can be used) for the duration of the COVID-19 declaration under Section 564(b)(1) of the Act, 21 U.S.C. section 360bbb-3(b)(1), unless the authorization is terminated or revoked.     Resp Syncytial Virus by PCR NEGATIVE NEGATIVE Final    Comment: (NOTE) Fact Sheet for Patients: BloggerCourse.com  Fact Sheet for Healthcare Providers: SeriousBroker.it  This test is not yet approved or cleared by the [place] FDA and has been authorized for detection and/or diagnosis of SARS-CoV-2 by FDA under an Emergency Use Authorization (EUA). This EUA will remain in effect (meaning this test can be used) for the duration of the COVID-19 declaration under Section 564(b)(1) of the Act, 21 U.S.C. section 360bbb-3(b)(1), unless the authorization is terminated or revoked.  Performed at [facility], [address], [place], [place]  [address]    *Note: Due to a large number of results and/or encounters for the requested time period, some results have not been displayed. A complete set of results can be found in Results Review.    Labs: CBC: Recent Labs  Lab 12/07/23 1344 12/11/23 0301  WBC 9.2 9.1  HGB 9.7* 9.2*  HCT 32.8* 31.5*  MCV 97.3 98.7  PLT 393 366   Basic Metabolic Panel: Recent Labs  Lab 12/07/23 1344 12/11/23 0301  NA 138 138  K 3.5 3.6  CL 97* 103  CO2 32 29  GLUCOSE 115* 122*  BUN 13 16  CREATININE 0.73 0.58  CALCIUM 8.4* 8.8*   Liver Function Tests: No results for input(s): "  AST", "ALT", "ALKPHOS", "BILITOT", "PROT", "ALBUMIN" in the last 168 hours. CBG: No results for input(s): "GLUCAP" in the last 168 hours.  Discharge time spent: greater than 30 minutes.  Signed: Demaris Fillers, MD [facility] 12/11/2023

## 2023-12-11 NOTE — Care Management Important Message (Signed)
 Important Message  Patient Details  Name: Kathleen [name] MRN: [id number] Date of Birth: 04/29/1961   Important Message Given:  N/A - LOS <3 / Initial given by admissions     [name] Madalin Hughart 12/11/2023, 11:04 AM

## 2023-12-11 NOTE — Plan of Care (Signed)
   Problem: Education: Goal: Knowledge of General Education information will improve Description: Including pain rating scale, medication(s)/side effects and non-pharmacologic comfort measures Outcome: Progressing   Problem: Clinical Measurements: Goal: Will remain free from infection Outcome: Progressing

## 2023-12-12 ENCOUNTER — Other Ambulatory Visit: Payer: Self-pay

## 2023-12-12 ENCOUNTER — Other Ambulatory Visit (HOSPITAL_COMMUNITY): Payer: Self-pay

## 2023-12-13 ENCOUNTER — Other Ambulatory Visit (HOSPITAL_COMMUNITY): Payer: Self-pay

## 2023-12-13 ENCOUNTER — Other Ambulatory Visit: Payer: Self-pay | Admitting: Family Medicine

## 2023-12-13 ENCOUNTER — Telehealth: Payer: Self-pay | Admitting: *Deleted

## 2023-12-13 NOTE — Telephone Encounter (Signed)
 See recent hospital visit - currently in rehab for PT at [facility]

## 2023-12-15 ENCOUNTER — Telehealth: Payer: Self-pay | Admitting: Family Medicine

## 2023-12-15 NOTE — Telephone Encounter (Signed)
 Discharge summary as well as admission summary from [facility] rehab was read over New message was sent to staff regarding this

## 2023-12-15 NOTE — Telephone Encounter (Signed)
 FYI-very difficult situation regarding pain She does see endocrinology.  Please so therefore we will message them to make sure they are aware of her compression fracture so that they can address osteoporosis When she was at [facility] they stopped her oxycodone  and started hydromorphone  2 mg every 4 as needed She is at [facility] rehab When she gets out of there she will need to do a follow-up with us  She will also need to do a visit with [facility] because we will not be able to prescribe hydromorphone  on a regular basis. Should be noted that originally we have tried multiple times to get her in with [facility] but due to medical issues and other issues patient has not been able to follow through with getting this appointment.  We have informed the patient that we can no longer prescribe the high dose of medication.  If we do prescribe the hydromorphone  it will only be temporarily while she is waiting to get in with pain management.  We will follow her closely.  More than likely patient will notify us  when she gets discharged from the rehab facility

## 2023-12-16 DIAGNOSIS — M5459 Other low back pain: Secondary | ICD-10-CM | POA: Diagnosis not present

## 2023-12-17 ENCOUNTER — Ambulatory Visit: Payer: HMO | Admitting: Family Medicine

## 2023-12-17 NOTE — Telephone Encounter (Signed)
 Patient is scheduled for office visit today to discuss pain management

## 2023-12-19 ENCOUNTER — Other Ambulatory Visit (HOSPITAL_COMMUNITY): Payer: Self-pay

## 2023-12-23 ENCOUNTER — Encounter (INDEPENDENT_AMBULATORY_CARE_PROVIDER_SITE_OTHER): Admitting: Physician Assistant

## 2023-12-23 ENCOUNTER — Encounter (INDEPENDENT_AMBULATORY_CARE_PROVIDER_SITE_OTHER): Payer: Self-pay

## 2023-12-30 ENCOUNTER — Other Ambulatory Visit (HOSPITAL_COMMUNITY): Payer: Self-pay

## 2023-12-30 ENCOUNTER — Other Ambulatory Visit: Payer: Self-pay | Admitting: Family Medicine

## 2023-12-30 MED FILL — Hydrocortisone Tab 10 MG: ORAL | 30 days supply | Qty: 120 | Fill #1 | Status: AC

## 2023-12-31 ENCOUNTER — Other Ambulatory Visit (HOSPITAL_COMMUNITY): Payer: Self-pay

## 2023-12-31 ENCOUNTER — Other Ambulatory Visit: Payer: Self-pay | Admitting: Family Medicine

## 2023-12-31 ENCOUNTER — Other Ambulatory Visit: Payer: Self-pay

## 2023-12-31 DIAGNOSIS — S32040A Wedge compression fracture of fourth lumbar vertebra, initial encounter for closed fracture: Secondary | ICD-10-CM | POA: Diagnosis not present

## 2023-12-31 DIAGNOSIS — F411 Generalized anxiety disorder: Secondary | ICD-10-CM | POA: Diagnosis not present

## 2023-12-31 DIAGNOSIS — Z79899 Other long term (current) drug therapy: Secondary | ICD-10-CM | POA: Diagnosis not present

## 2023-12-31 DIAGNOSIS — G8929 Other chronic pain: Secondary | ICD-10-CM | POA: Diagnosis not present

## 2023-12-31 DIAGNOSIS — M25552 Pain in left hip: Secondary | ICD-10-CM | POA: Diagnosis not present

## 2023-12-31 DIAGNOSIS — J9691 Respiratory failure, unspecified with hypoxia: Secondary | ICD-10-CM | POA: Diagnosis not present

## 2023-12-31 DIAGNOSIS — M87052 Idiopathic aseptic necrosis of left femur: Secondary | ICD-10-CM | POA: Diagnosis not present

## 2023-12-31 DIAGNOSIS — E038 Other specified hypothyroidism: Secondary | ICD-10-CM | POA: Diagnosis not present

## 2023-12-31 MED ORDER — TRAZODONE HCL 50 MG PO TABS
50.0000 mg | ORAL_TABLET | Freq: Every day | ORAL | 99 refills | Status: DC
  Filled 2023-12-31 (×2): qty 30, 30d supply, fill #0

## 2023-12-31 MED ORDER — SENNOSIDES-DOCUSATE SODIUM 8.6-50 MG PO TABS
1.0000 | ORAL_TABLET | Freq: Two times a day (BID) | ORAL | 0 refills | Status: DC | PRN
  Filled 2023-12-31 (×2): qty 60, 30d supply, fill #0

## 2023-12-31 MED ORDER — ACETAMINOPHEN 325 MG PO TABS
650.0000 mg | ORAL_TABLET | Freq: Four times a day (QID) | ORAL | 0 refills | Status: DC
  Filled 2023-12-31 (×2): qty 90, 12d supply, fill #0

## 2023-12-31 MED ORDER — NALOXONE HCL 4 MG/0.1ML NA LIQD: 1.0000 | Freq: Two times a day (BID) | NASAL | 1 refills | Status: DC | PRN

## 2023-12-31 MED ORDER — NALOXONE HCL 4 MG/0.1ML NA LIQD
NASAL | 0 refills | Status: DC
  Filled 2023-12-31 (×2): qty 2, 1d supply, fill #0

## 2023-12-31 MED ORDER — DULOXETINE HCL 60 MG PO CPEP: 60.0000 mg | ORAL_CAPSULE | Freq: Every morning | ORAL | 0 refills | Status: DC

## 2023-12-31 MED ORDER — FUROSEMIDE 20 MG PO TABS
20.0000 mg | ORAL_TABLET | ORAL | 0 refills | Status: DC
  Filled 2023-12-31 (×2): qty 12, 28d supply, fill #0

## 2023-12-31 MED ORDER — ALBUTEROL SULFATE (2.5 MG/3ML) 0.083% IN NEBU
INHALATION_SOLUTION | RESPIRATORY_TRACT | 0 refills | Status: DC
Start: 2023-12-31 — End: 2024-01-03
  Filled 2023-12-31: qty 90, 15d supply, fill #0
  Filled 2023-12-31: qty 75, 5d supply, fill #0

## 2023-12-31 MED ORDER — DULOXETINE HCL 60 MG PO CPEP
60.0000 mg | ORAL_CAPSULE | Freq: Every morning | ORAL | 0 refills | Status: DC
  Filled 2023-12-31: qty 30, 30d supply, fill #0

## 2023-12-31 MED ORDER — CALCITONIN (SALMON) 200 UNIT/ACT NA SOLN
NASAL | 0 refills | Status: DC
Start: 2023-12-31 — End: 2024-01-03
  Filled 2023-12-31 (×2): qty 3.7, 30d supply, fill #0

## 2023-12-31 MED ORDER — BUDESONIDE 0.5 MG/2ML IN SUSP
RESPIRATORY_TRACT | 0 refills | Status: DC
  Filled 2023-12-31: qty 120, 15d supply, fill #0
  Filled 2023-12-31: qty 120, 30d supply, fill #0

## 2023-12-31 MED ORDER — ALBUTEROL SULFATE HFA 108 (90 BASE) MCG/ACT IN AERS
INHALATION_SPRAY | RESPIRATORY_TRACT | 0 refills | Status: DC
Start: 2023-12-31 — End: 2024-01-03
  Filled 2023-12-31 (×2): qty 6.7, 30d supply, fill #0

## 2023-12-31 MED ORDER — ATORVASTATIN CALCIUM 40 MG PO TABS
40.0000 mg | ORAL_TABLET | Freq: Every day | ORAL | 0 refills | Status: DC
  Filled 2023-12-31 (×2): qty 30, 30d supply, fill #0

## 2023-12-31 MED ORDER — BUSPIRONE HCL 15 MG PO TABS
15.0000 mg | ORAL_TABLET | Freq: Three times a day (TID) | ORAL | 0 refills | Status: DC
Start: 2023-12-31 — End: 2024-01-03
  Filled 2023-12-31: qty 90, 30d supply, fill #0

## 2024-01-01 ENCOUNTER — Other Ambulatory Visit (HOSPITAL_COMMUNITY): Payer: Self-pay

## 2024-01-01 ENCOUNTER — Encounter: Payer: Self-pay | Admitting: Family Medicine

## 2024-01-01 ENCOUNTER — Ambulatory Visit: Admitting: Family Medicine

## 2024-01-01 ENCOUNTER — Other Ambulatory Visit: Payer: Self-pay

## 2024-01-01 VITALS — BP 112/72 | HR 96 | Temp 97.0°F | Ht 70.0 in

## 2024-01-01 DIAGNOSIS — M8000XG Age-related osteoporosis with current pathological fracture, unspecified site, subsequent encounter for fracture with delayed healing: Secondary | ICD-10-CM

## 2024-01-01 DIAGNOSIS — S22000G Wedge compression fracture of unspecified thoracic vertebra, subsequent encounter for fracture with delayed healing: Secondary | ICD-10-CM

## 2024-01-01 DIAGNOSIS — G894 Chronic pain syndrome: Secondary | ICD-10-CM

## 2024-01-01 MED ORDER — OXYCODONE-ACETAMINOPHEN 10-325 MG PO TABS
1.0000 | ORAL_TABLET | ORAL | 0 refills | Status: DC | PRN
  Filled 2024-01-01: qty 180, 30d supply, fill #0

## 2024-01-01 NOTE — Progress Notes (Signed)
   Subjective:    Patient ID: Kathleen [name], female    DOB: March 07, 1961, 63 y.o.   MRN: [id number]  HPI Rehab follow up  Patient was in the hospital with compression fracture She has osteoporosis She will be seeing endocrinology soon She is on pain medicine Takes oxycodone  6 tablets/day She does not feel that that helps enough for her pain She did see pain management with [facility] and they stated that that is the regimen she needs to stay on They sent in her medicines today Patient states while in rehab she was trying to transfer herself from the bed to the commode slipped twisted her back and has increased pain and discomfort   Review of Systems     Objective:   Physical Exam General-in no acute distress Eyes-no discharge Lungs-respiratory rate normal, CTA CV-no murmurs,RRR Extremities skin warm dry no edema Neuro grossly normal Behavior normal, alert Significant pain in the mid and lower back to palpation       Assessment & Plan:  Will go forward with CT scan to see if there is been worsening of the compression fracture She is under the care of Dr. [name] at [facility] they are thinking about possibly doing kyphoplasty This will need clearance by her pulmonologist at [facility] I encouraged her to have Dr. [name] connect with them We will repeat the CT as stated above As for pain control this is through [facility] medical Oxycodone  10 mg / 325 mg, 1 every 4 hours maximum 6/day she had her medicines filled today She will follow-up in 6 weeks sooner if any problems She will see endocrinology in the near future we have previously communicated with them about starting her on Prolia shots

## 2024-01-02 ENCOUNTER — Other Ambulatory Visit: Payer: Self-pay

## 2024-01-02 ENCOUNTER — Telehealth: Payer: Self-pay | Admitting: Family Medicine

## 2024-01-02 DIAGNOSIS — S22000G Wedge compression fracture of unspecified thoracic vertebra, subsequent encounter for fracture with delayed healing: Secondary | ICD-10-CM

## 2024-01-02 NOTE — Telephone Encounter (Signed)
 Patient with significant back pain in the thoracic area Has a history of thoracic vertebral compression fracture She feels she is reinjured this having severe pain feels she cannot move hardly at all she is pretty much bound to a wheelchair at the moment  We need to repeat thoracic spine CT scan Patient is not a good candidate for MRI because of the amount of pain and unable to lay flat long at all May have thoracic spine CT scan without contrast this needs to be scheduled either this week or next week with stat results please  Patient is under the care of Dr. [name] [facility] Under pain management with [facility]  Thanks-Dr. Geralyn Knee

## 2024-01-02 NOTE — Telephone Encounter (Signed)
 CT ordered and sent to be scheduled stat

## 2024-01-02 NOTE — Addendum Note (Signed)
 Addended by: Rooney Cole on: 01/02/2024 03:33 PM   Modules accepted: Orders

## 2024-01-03 ENCOUNTER — Inpatient Hospital Stay: Admitting: Physician Assistant

## 2024-01-03 ENCOUNTER — Emergency Department (HOSPITAL_COMMUNITY)

## 2024-01-03 ENCOUNTER — Encounter (HOSPITAL_COMMUNITY): Payer: Self-pay | Admitting: Emergency Medicine

## 2024-01-03 ENCOUNTER — Other Ambulatory Visit: Payer: Self-pay

## 2024-01-03 ENCOUNTER — Inpatient Hospital Stay (HOSPITAL_COMMUNITY)
Admission: EM | Admit: 2024-01-03 | Discharge: 2024-01-08 | DRG: 543 | Disposition: A | Discharge status: Home Health | Attending: Internal Medicine | Admitting: Internal Medicine

## 2024-01-03 DIAGNOSIS — E66813 Obesity, class 3: Secondary | ICD-10-CM | POA: Diagnosis present

## 2024-01-03 DIAGNOSIS — D869 Sarcoidosis, unspecified: Secondary | ICD-10-CM | POA: Diagnosis present

## 2024-01-03 DIAGNOSIS — S32030A Wedge compression fracture of third lumbar vertebra, initial encounter for closed fracture: Secondary | ICD-10-CM | POA: Diagnosis not present

## 2024-01-03 DIAGNOSIS — Z818 Family history of other mental and behavioral disorders: Secondary | ICD-10-CM

## 2024-01-03 DIAGNOSIS — K5900 Constipation, unspecified: Secondary | ICD-10-CM | POA: Diagnosis present

## 2024-01-03 DIAGNOSIS — R262 Difficulty in walking, not elsewhere classified: Secondary | ICD-10-CM | POA: Diagnosis present

## 2024-01-03 DIAGNOSIS — R339 Retention of urine, unspecified: Secondary | ICD-10-CM | POA: Diagnosis not present

## 2024-01-03 DIAGNOSIS — E559 Vitamin D deficiency, unspecified: Secondary | ICD-10-CM | POA: Diagnosis present

## 2024-01-03 DIAGNOSIS — R0989 Other specified symptoms and signs involving the circulatory and respiratory systems: Secondary | ICD-10-CM | POA: Diagnosis not present

## 2024-01-03 DIAGNOSIS — G4733 Obstructive sleep apnea (adult) (pediatric): Secondary | ICD-10-CM | POA: Diagnosis not present

## 2024-01-03 DIAGNOSIS — Z9981 Dependence on supplemental oxygen: Secondary | ICD-10-CM

## 2024-01-03 DIAGNOSIS — Z6841 Body Mass Index (BMI) 40.0 and over, adult: Secondary | ICD-10-CM | POA: Diagnosis not present

## 2024-01-03 DIAGNOSIS — K219 Gastro-esophageal reflux disease without esophagitis: Secondary | ICD-10-CM | POA: Diagnosis present

## 2024-01-03 DIAGNOSIS — Z860100 Personal history of colon polyps, unspecified: Secondary | ICD-10-CM

## 2024-01-03 DIAGNOSIS — I341 Nonrheumatic mitral (valve) prolapse: Secondary | ICD-10-CM | POA: Diagnosis not present

## 2024-01-03 DIAGNOSIS — I10 Essential (primary) hypertension: Secondary | ICD-10-CM | POA: Diagnosis present

## 2024-01-03 DIAGNOSIS — E271 Primary adrenocortical insufficiency: Secondary | ICD-10-CM | POA: Diagnosis not present

## 2024-01-03 DIAGNOSIS — G629 Polyneuropathy, unspecified: Secondary | ICD-10-CM | POA: Diagnosis present

## 2024-01-03 DIAGNOSIS — Z91030 Bee allergy status: Secondary | ICD-10-CM

## 2024-01-03 DIAGNOSIS — Z91041 Radiographic dye allergy status: Secondary | ICD-10-CM

## 2024-01-03 DIAGNOSIS — Z79899 Other long term (current) drug therapy: Secondary | ICD-10-CM

## 2024-01-03 DIAGNOSIS — M47816 Spondylosis without myelopathy or radiculopathy, lumbar region: Secondary | ICD-10-CM | POA: Diagnosis not present

## 2024-01-03 DIAGNOSIS — M5459 Other low back pain: Secondary | ICD-10-CM | POA: Diagnosis not present

## 2024-01-03 DIAGNOSIS — Z8249 Family history of ischemic heart disease and other diseases of the circulatory system: Secondary | ICD-10-CM

## 2024-01-03 DIAGNOSIS — R52 Pain, unspecified: Secondary | ICD-10-CM | POA: Diagnosis not present

## 2024-01-03 DIAGNOSIS — J9611 Chronic respiratory failure with hypoxia: Secondary | ICD-10-CM | POA: Diagnosis present

## 2024-01-03 DIAGNOSIS — G894 Chronic pain syndrome: Secondary | ICD-10-CM | POA: Diagnosis not present

## 2024-01-03 DIAGNOSIS — F419 Anxiety disorder, unspecified: Secondary | ICD-10-CM | POA: Diagnosis present

## 2024-01-03 DIAGNOSIS — R29890 Loss of height: Secondary | ICD-10-CM | POA: Diagnosis not present

## 2024-01-03 DIAGNOSIS — M4856XA Collapsed vertebra, not elsewhere classified, lumbar region, initial encounter for fracture: Principal | ICD-10-CM | POA: Diagnosis present

## 2024-01-03 DIAGNOSIS — R338 Other retention of urine: Secondary | ICD-10-CM

## 2024-01-03 DIAGNOSIS — I9589 Other hypotension: Secondary | ICD-10-CM | POA: Diagnosis present

## 2024-01-03 DIAGNOSIS — Z79890 Hormone replacement therapy (postmenopausal): Secondary | ICD-10-CM

## 2024-01-03 DIAGNOSIS — J849 Interstitial pulmonary disease, unspecified: Secondary | ICD-10-CM | POA: Diagnosis not present

## 2024-01-03 DIAGNOSIS — E039 Hypothyroidism, unspecified: Secondary | ICD-10-CM | POA: Diagnosis not present

## 2024-01-03 DIAGNOSIS — Z885 Allergy status to narcotic agent status: Secondary | ICD-10-CM

## 2024-01-03 DIAGNOSIS — F32A Depression, unspecified: Secondary | ICD-10-CM | POA: Diagnosis present

## 2024-01-03 DIAGNOSIS — Z7952 Long term (current) use of systemic steroids: Secondary | ICD-10-CM

## 2024-01-03 DIAGNOSIS — R079 Chest pain, unspecified: Secondary | ICD-10-CM | POA: Diagnosis not present

## 2024-01-03 DIAGNOSIS — Z803 Family history of malignant neoplasm of breast: Secondary | ICD-10-CM

## 2024-01-03 DIAGNOSIS — Z882 Allergy status to sulfonamides status: Secondary | ICD-10-CM

## 2024-01-03 DIAGNOSIS — M5126 Other intervertebral disc displacement, lumbar region: Secondary | ICD-10-CM | POA: Diagnosis not present

## 2024-01-03 DIAGNOSIS — M797 Fibromyalgia: Secondary | ICD-10-CM | POA: Diagnosis not present

## 2024-01-03 DIAGNOSIS — Z7951 Long term (current) use of inhaled steroids: Secondary | ICD-10-CM

## 2024-01-03 LAB — URINALYSIS, W/ REFLEX TO CULTURE (INFECTION SUSPECTED)
Bacteria, UA: NONE SEEN
Bilirubin Urine: NEGATIVE
Glucose, UA: NEGATIVE mg/dL
Hgb urine dipstick: NEGATIVE
Ketones, ur: NEGATIVE mg/dL
Leukocytes,Ua: NEGATIVE
Nitrite: NEGATIVE
Protein, ur: NEGATIVE mg/dL
Specific Gravity, Urine: 1.003 — ABNORMAL LOW (ref 1.005–1.030)
pH: 7 (ref 5.0–8.0)

## 2024-01-03 LAB — BASIC METABOLIC PANEL WITH GFR
Anion gap: 7 (ref 5–15)
BUN: 9 mg/dL (ref 8–23)
CO2: 37 mmol/L — ABNORMAL HIGH (ref 22–32)
Calcium: 9.3 mg/dL (ref 8.9–10.3)
Chloride: 97 mmol/L — ABNORMAL LOW (ref 98–111)
Creatinine, Ser: 0.68 mg/dL (ref 0.44–1.00)
GFR, Estimated: >60 mL/min (ref >60)
Glucose, Bld: 125 mg/dL — ABNORMAL HIGH (ref 70–99)
Potassium: 4.3 mmol/L (ref 3.5–5.1)
Sodium: 141 mmol/L (ref 135–145)

## 2024-01-03 LAB — CBC WITH DIFFERENTIAL/PLATELET
Abs Immature Granulocytes: 0.38 10*3/uL — ABNORMAL HIGH (ref 0.00–0.07)
Basophils Absolute: 0.1 10*3/uL (ref 0.0–0.1)
Basophils Relative: 1 %
Eosinophils Absolute: 0.1 10*3/uL (ref 0.0–0.5)
Eosinophils Relative: 1 %
HCT: 31.2 % — ABNORMAL LOW (ref 36.0–46.0)
Hemoglobin: 9.1 g/dL — ABNORMAL LOW (ref 12.0–15.0)
Immature Granulocytes: 4 %
Lymphocytes Relative: 8 %
Lymphs Abs: 0.7 10*3/uL (ref 0.7–4.0)
MCH: 28.4 pg (ref 26.0–34.0)
MCHC: 29.2 g/dL — ABNORMAL LOW (ref 30.0–36.0)
MCV: 97.5 fL (ref 80.0–100.0)
Monocytes Absolute: 0.7 10*3/uL (ref 0.1–1.0)
Monocytes Relative: 8 %
Neutro Abs: 7.5 10*3/uL (ref 1.7–7.7)
Neutrophils Relative %: 78 %
Platelets: 367 10*3/uL (ref 150–400)
RBC: 3.2 MIL/uL — ABNORMAL LOW (ref 3.87–5.11)
RDW: 15.5 % (ref 11.5–15.5)
WBC: 9.5 10*3/uL (ref 4.0–10.5)
nRBC: 0.7 % — ABNORMAL HIGH (ref 0.0–0.2)

## 2024-01-03 LAB — TROPONIN I (HIGH SENSITIVITY): Troponin I (High Sensitivity): 7 ng/L (ref <18)

## 2024-01-03 MED ORDER — CALCITONIN (SALMON) 200 UNIT/ACT NA SOLN
1.0000 | Freq: Every day | NASAL | Status: DC
  Administered 2024-01-04 – 2024-01-08 (×5): 1 via NASAL
  Filled 2024-01-03: qty 3.7

## 2024-01-03 MED ORDER — PANTOPRAZOLE SODIUM 40 MG PO TBEC
40.0000 mg | DELAYED_RELEASE_TABLET | Freq: Two times a day (BID) | ORAL | Status: DC
  Administered 2024-01-04 – 2024-01-08 (×9): 40 mg via ORAL
  Filled 2024-01-03 (×9): qty 1

## 2024-01-03 MED ORDER — HYDROCORTISONE 20 MG PO TABS
20.0000 mg | ORAL_TABLET | Freq: Every day | ORAL | Status: DC
  Administered 2024-01-04 – 2024-01-08 (×5): 20 mg via ORAL
  Filled 2024-01-03 (×6): qty 1

## 2024-01-03 MED ORDER — FUROSEMIDE 20 MG PO TABS
20.0000 mg | ORAL_TABLET | ORAL | Status: DC
  Administered 2024-01-06 – 2024-01-08 (×2): 20 mg via ORAL
  Filled 2024-01-03 (×3): qty 1

## 2024-01-03 MED ORDER — LEVOTHYROXINE SODIUM 50 MCG PO TABS
50.0000 ug | ORAL_TABLET | Freq: Every day | ORAL | Status: DC
  Administered 2024-01-04 – 2024-01-08 (×5): 50 ug via ORAL
  Filled 2024-01-03 (×5): qty 1

## 2024-01-03 MED ORDER — IPRATROPIUM-ALBUTEROL 0.5-2.5 (3) MG/3ML IN SOLN
3.0000 mL | Freq: Three times a day (TID) | RESPIRATORY_TRACT | Status: DC
  Administered 2024-01-03: 3 mL via RESPIRATORY_TRACT
  Filled 2024-01-03: qty 3

## 2024-01-03 MED ORDER — OYSTER SHELL CALCIUM/D3 500-5 MG-MCG PO TABS
1.0000 | ORAL_TABLET | Freq: Two times a day (BID) | ORAL | Status: DC
  Administered 2024-01-03 – 2024-01-08 (×10): 1 via ORAL
  Filled 2024-01-03 (×10): qty 1

## 2024-01-03 MED ORDER — MIDODRINE HCL 5 MG PO TABS
5.0000 mg | ORAL_TABLET | Freq: Two times a day (BID) | ORAL | Status: DC
  Administered 2024-01-04 – 2024-01-08 (×9): 5 mg via ORAL
  Filled 2024-01-03 (×9): qty 1

## 2024-01-03 MED ORDER — LACTULOSE 10 GM/15ML PO SOLN
10.0000 g | Freq: Four times a day (QID) | ORAL | Status: DC | PRN
  Administered 2024-01-05: 10 g via ORAL
  Filled 2024-01-03: qty 30

## 2024-01-03 MED ORDER — PREDNISONE 5 MG PO TABS
5.0000 mg | ORAL_TABLET | Freq: Every morning | ORAL | Status: DC
  Administered 2024-01-04 – 2024-01-08 (×5): 5 mg via ORAL
  Filled 2024-01-03 (×5): qty 1

## 2024-01-03 MED ORDER — OXYCODONE-ACETAMINOPHEN 10-325 MG PO TABS: 1.0000 | ORAL_TABLET | ORAL | Status: DC | PRN

## 2024-01-03 MED ORDER — NALOXONE HCL 0.4 MG/ML IJ SOLN: 0.4000 mg | INTRAMUSCULAR | Status: DC | PRN

## 2024-01-03 MED ORDER — ALBUTEROL SULFATE (2.5 MG/3ML) 0.083% IN NEBU: 2.5000 mg | INHALATION_SOLUTION | RESPIRATORY_TRACT | Status: DC | PRN

## 2024-01-03 MED ORDER — ACETAMINOPHEN 325 MG PO TABS
650.0000 mg | ORAL_TABLET | Freq: Four times a day (QID) | ORAL | Status: DC
  Administered 2024-01-03 – 2024-01-07 (×11): 650 mg via ORAL
  Filled 2024-01-03 (×14): qty 2

## 2024-01-03 MED ORDER — DOCUSATE SODIUM 100 MG PO CAPS
100.0000 mg | ORAL_CAPSULE | Freq: Two times a day (BID) | ORAL | Status: DC
  Administered 2024-01-03 – 2024-01-08 (×10): 100 mg via ORAL
  Filled 2024-01-03 (×10): qty 1

## 2024-01-03 MED ORDER — LIDOCAINE 5 % EX PTCH
2.0000 | MEDICATED_PATCH | CUTANEOUS | Status: DC
  Administered 2024-01-03 – 2024-01-08 (×5): 2 via TRANSDERMAL
  Filled 2024-01-03 (×5): qty 2

## 2024-01-03 MED ORDER — LINACLOTIDE 145 MCG PO CAPS
290.0000 ug | ORAL_CAPSULE | Freq: Every day | ORAL | Status: DC
  Administered 2024-01-04 – 2024-01-08 (×5): 290 ug via ORAL
  Filled 2024-01-03 (×5): qty 2

## 2024-01-03 MED ORDER — BUSPIRONE HCL 5 MG PO TABS
15.0000 mg | ORAL_TABLET | Freq: Three times a day (TID) | ORAL | Status: DC
Start: 2024-01-03 — End: 2024-01-08
  Administered 2024-01-03 – 2024-01-08 (×14): 15 mg via ORAL
  Filled 2024-01-03 (×14): qty 3

## 2024-01-03 MED ORDER — DULOXETINE HCL 30 MG PO CPEP
60.0000 mg | ORAL_CAPSULE | Freq: Every morning | ORAL | Status: DC
  Administered 2024-01-04 – 2024-01-08 (×5): 60 mg via ORAL
  Filled 2024-01-03 (×5): qty 2

## 2024-01-03 MED ORDER — BUDESONIDE 0.5 MG/2ML IN SUSP
0.5000 mg | Freq: Two times a day (BID) | RESPIRATORY_TRACT | Status: DC
  Administered 2024-01-03 – 2024-01-08 (×10): 0.5 mg via RESPIRATORY_TRACT
  Filled 2024-01-03 (×10): qty 2

## 2024-01-03 MED ORDER — TAMSULOSIN HCL 0.4 MG PO CAPS
0.4000 mg | ORAL_CAPSULE | Freq: Every day | ORAL | Status: DC
Start: 2024-01-04 — End: 2024-01-08
  Administered 2024-01-04 – 2024-01-07 (×4): 0.4 mg via ORAL
  Filled 2024-01-03 (×4): qty 1

## 2024-01-03 MED ORDER — HYDROXYZINE HCL 25 MG PO TABS
25.0000 mg | ORAL_TABLET | Freq: Three times a day (TID) | ORAL | Status: DC | PRN
  Administered 2024-01-03 – 2024-01-08 (×7): 25 mg via ORAL
  Filled 2024-01-03 (×7): qty 1

## 2024-01-03 MED ORDER — OXYCODONE HCL 5 MG PO TABS
10.0000 mg | ORAL_TABLET | ORAL | Status: DC | PRN
  Administered 2024-01-04 – 2024-01-07 (×13): 10 mg via ORAL
  Filled 2024-01-03 (×15): qty 2

## 2024-01-03 MED ORDER — ATORVASTATIN CALCIUM 40 MG PO TABS: 40.0000 mg | ORAL_TABLET | Freq: Every day | ORAL | Status: DC

## 2024-01-03 MED ORDER — POLYETHYLENE GLYCOL 3350 17 G PO PACK
17.0000 g | PACK | Freq: Two times a day (BID) | ORAL | Status: DC
  Administered 2024-01-03 – 2024-01-06 (×5): 17 g via ORAL
  Filled 2024-01-03 (×10): qty 1

## 2024-01-03 MED ORDER — HYDROMORPHONE HCL 1 MG/ML IJ SOLN
0.5000 mg | INTRAMUSCULAR | Status: DC | PRN
  Administered 2024-01-03 – 2024-01-04 (×2): 0.5 mg via INTRAVENOUS
  Filled 2024-01-03 (×3): qty 0.5

## 2024-01-03 MED ORDER — ROSUVASTATIN CALCIUM 20 MG PO TABS
20.0000 mg | ORAL_TABLET | Freq: Every day | ORAL | Status: DC
  Administered 2024-01-04 – 2024-01-08 (×5): 20 mg via ORAL
  Filled 2024-01-03 (×5): qty 1

## 2024-01-03 MED ORDER — HYDROCORTISONE 10 MG PO TABS
10.0000 mg | ORAL_TABLET | ORAL | Status: DC
  Administered 2024-01-04 – 2024-01-07 (×4): 10 mg via ORAL
  Filled 2024-01-03 (×6): qty 1

## 2024-01-03 MED ORDER — HYDROMORPHONE HCL 1 MG/ML IJ SOLN
0.5000 mg | Freq: Once | INTRAMUSCULAR | Status: AC
  Administered 2024-01-03: 0.5 mg via INTRAVENOUS
  Filled 2024-01-03: qty 0.5

## 2024-01-03 MED ORDER — ENOXAPARIN SODIUM 80 MG/0.8ML IJ SOSY
65.0000 mg | PREFILLED_SYRINGE | INTRAMUSCULAR | Status: DC
  Administered 2024-01-04: 65 mg via SUBCUTANEOUS
  Filled 2024-01-03: qty 0.8

## 2024-01-03 MED ORDER — ACETAMINOPHEN 325 MG PO TABS
325.0000 mg | ORAL_TABLET | ORAL | Status: DC | PRN
  Administered 2024-01-04 – 2024-01-06 (×6): 325 mg via ORAL
  Filled 2024-01-03 (×4): qty 1

## 2024-01-03 MED ORDER — HYDROMORPHONE HCL 1 MG/ML IJ SOLN
1.0000 mg | Freq: Once | INTRAMUSCULAR | Status: AC
  Administered 2024-01-03: 1 mg via INTRAVENOUS
  Filled 2024-01-03: qty 1

## 2024-01-03 MED ORDER — FERROUS SULFATE 325 (65 FE) MG PO TABS
325.0000 mg | ORAL_TABLET | ORAL | Status: DC
  Administered 2024-01-07: 325 mg via ORAL
  Filled 2024-01-03: qty 1

## 2024-01-03 NOTE — ED Notes (Signed)
 ED Provider at bedside.

## 2024-01-03 NOTE — ED Notes (Signed)
 Patient transported to MRI

## 2024-01-03 NOTE — Telephone Encounter (Signed)
 Copied from CRM [id number]. Topic: Clinical - Home Health Verbal Orders >> Jan 03, 2024 12:57 PM Crispin Dolphin wrote: Caller/Agency: Jenna with [facility] Number: [phone] Service Requested: Skilled Nursing (1st), PT, OT and Speech therapy - start date 5/11 Frequency: N/A - calling to make provider aware of delay in services - was not able to get in contact with patient until today. Will start 5/11 Any new concerns about the patient? N/A

## 2024-01-03 NOTE — ED Provider Notes (Signed)
 [place] EMERGENCY DEPARTMENT AT [facility] Provider Note  CSN: [id number] Arrival date & time: 01/03/24 1631  Chief Complaint(s) Back Pain  HPI Kathleen [name] is a 63 y.o. female history of adrenal insufficiency on chronic steroids, obesity, sarcoidosis, presenting to the emergency department with low back pain.  Patient reports back pain..  Reports that she was at nursing facility recently, got on the commode, twisted and then developed low back pain.  She reports this is similar to previous low back pain.  She also reports urinary retention which she has had previously.  She denies any incontinence.  She has a sensation of her bladder being full.  She also reports constipation without fecal incontinence.  No fevers or chills.  No abdominal pain.  She does report some chest pain, worse with movement.  Denies any shortness of breath, pleuritic pain.  Patient was recently admitted to the hospital for similar issues and discharged to rehab.  She reports she feels similar today.   Past Medical History Past Medical History:  Diagnosis Date   Adrenal insufficiency (HCC)    Anemia    Anxiety    Avascular bone necrosis (HCC)    Breast fibrocystic disorder    Chronic pain    Encounter for long-term opiate analgesic use 02/02/2020   Patient has chronic pain with femoral head avascular necrosis.  Is under pain contract   Fibromyalgia    Gastroesophageal reflux disease    Hypothyroidism    Mitral valve prolapse    Orthostatic hypotension    Peripheral neuropathy    PONV (postoperative nausea and vomiting)    Restrictive lung disease    Moderate to severe   Sarcoidosis    Biopsy proven - [facility]   Sarcoidosis    SOB (shortness of breath)    chronic   Patient Active Problem List   Diagnosis Date Noted   Intractable low back pain 01/03/2024   Ambulatory dysfunction 12/11/2023   Uncontrolled pain 12/08/2023   Pressure injury of skin 12/08/2023   Intractable back pain 12/07/2023    Class 1 obesity due to excess calories with serious comorbidity and body mass index (BMI) of 34.0 to 34.9 in adult 10/28/2023   Prediabetes 10/28/2023   Ankle fracture, bimalleolar, closed, left, sequela 03/10/2023   Closed fracture of left ankle 03/10/2023   Syndesmotic disruption of ankle, left, initial encounter 03/10/2023   Lumbar transverse process fracture (HCC) 01/02/2023   Acute on chronic hypoxic respiratory failure (HCC) 12/13/2022   Depression, major, single episode, moderate (HCC) 09/10/2022   Aortic atherosclerosis (HCC) 09/10/2022   Chronic respiratory failure with hypoxia (HCC) 05/22/2021   Anxiety and depression 05/22/2021   Vitamin D  deficiency 05/09/2021   Left foot drop 01/09/2021   Pulmonary HTN (HCC) 10/12/2020   Encounter for long-term opiate analgesic use 02/02/2020   Interstitial lung disease (HCC) 04/14/2019   GERD (gastroesophageal reflux disease) 10/15/2017   Personal history of noncompliance with medical treatment, presenting hazards to health 04/26/2017   Hypothyroidism 01/15/2017   Adrenal insufficiency (Addison's disease) (HCC) 08/22/2016   Symptomatic bradycardia    Adrenal insufficiency (HCC) 08/17/2016   Hypokalemia 08/17/2016   Chronic pain syndrome 04/20/2016   Irritable bowel syndrome with constipation 11/14/2015   Avascular necrosis of bone of hip, left (HCC) 04/05/2015   Post herpetic neuralgia 10/22/2014   Mixed hyperlipidemia 08/25/2014   Obstructive sleep apnea 02/18/2014   Class 2 severe obesity due to excess calories with serious comorbidity and body mass index (BMI) of  38.0 to 38.9 in adult [facility]) 05/06/2013   Fibromyalgia 02/11/2013   Bilateral lower extremity edema 01/22/2013   Hypotension 01/16/2013   Fibrocystic breast disease in female 04/09/2012   Sarcoidosis 06/21/2008   Home Medication(s) Prior to Admission medications   Medication Sig Start Date End Date Taking? Authorizing Provider  acetaminophen  (TYLENOL ) 325 MG tablet  Take 2 tablets (650 mg total) by mouth every 6 (six) hours. 12/11/23   Demaris Fillers, MD  acetaminophen  (TYLENOL ) 325 MG tablet Take 2 tablets (650 mg total) by mouth every 6 (six) hours. 12/31/23     albuterol  (PROVENTIL ) (2.5 MG/3ML) 0.083% nebulizer solution Take 3 mLs (2.5 mg total) by nebulization every 4 (four) hours as needed for wheezing or shortness of breath. 12/15/22 12/15/23  Colin Dawley, MD  albuterol  (PROVENTIL ) (2.5 MG/3ML) 0.083% nebulizer solution Inhale 3 mL (2.5 mg total) by nebulization every four (4) hours as needed for wheezing or shortness of breath. 12/31/23     albuterol  (VENTOLIN  HFA) 108 (90 Base) MCG/ACT inhaler Inhale 2 puffs into the lungs every 6 (six) hours as needed for wheezing or shortness of breath. 12/15/22   Colin Dawley, MD  albuterol  (VENTOLIN  HFA) 108 (90 Base) MCG/ACT inhaler Inhale 2 puffs every six (6) hours as needed for wheezing or shortness of breath. 12/31/23     atorvastatin  (LIPITOR) 40 MG tablet Take 1 tablet (40 mg total) by mouth nightly 12/31/23     budesonide  (PULMICORT ) 0.5 MG/2ML nebulizer solution Inhale the contents of 1 vial via nebulizer two (2) times a day. 11/22/21     budesonide  (PULMICORT ) 0.5 MG/2ML nebulizer solution Inhale 2 mL (0.5 mg total) by nebulization every twelve (12) hours as needed (asthma). 12/31/23     busPIRone  (BUSPAR ) 15 MG tablet Take 1 tablet (15 mg) by mouth 3 times daily. 10/11/23   Bennet Brasil, MD  busPIRone  (BUSPAR ) 15 MG tablet Take 1 tablet (15 mg total) by mouth 3 (three) times daily. 12/31/23     calcitonin, salmon, (MIACALCIN ) 200 UNIT/ACT nasal spray Use 1 spray in one nostril per day, alternate nostrils every other day (may have 1 vial she should use this for at least 2 weeks but may use as long as 4 weeks) 03/01/23   Bennet Brasil, MD  calcitonin, salmon, (MIACALCIN /FORTICAL) 200 UNIT/ACT nasal spray Use 1 spray into alternating nostrils in the morning. 12/31/23     DULoxetine  (CYMBALTA ) 60 MG capsule Take 1 capsule  (60 mg total) by mouth in the morning. 12/31/23     DULoxetine  (CYMBALTA ) 60 MG capsule Take 1 capsule (60 mg total) by mouth in the morning. 12/31/23     EPINEPHrine  0.3 mg/0.3 mL IJ SOAJ injection Inject 0.3 mg into the muscle as needed for anaphylaxis. 01/05/22   Bennet Brasil, MD  ferrous sulfate  325 (65 FE) MG EC tablet Take 325 mg by mouth once a week.    [provider]  furosemide  (LASIX ) 20 MG tablet Take 1 tablet (20 mg total) by mouth every Monday, Wednesday, and Friday. 07/17/23   Bennet Brasil, MD  furosemide  (LASIX ) 20 MG tablet Take 1 tablet (20 mg total) by mouth every Monday, Wednesday and Friday. 12/31/23     hydrocortisone  (CORTEF ) 10 MG tablet Take 2 tablets by mouth at 8:00 am and 1 tablet at lunch. (May double dose on sick days x 3-5 days) 11/14/23   Nida, Gebreselassie W, MD  HYDROmorphone  (DILAUDID ) 2 MG tablet Take 1 tablet (2 mg total) by mouth  every 4 (four) hours as needed for severe pain (pain score 7-10). 12/11/23   Tat, Myrtie Atkinson, MD  hydrOXYzine  (ATARAX ) 25 MG tablet Take 1 tablet (25 mg total) by mouth 3 (three) times daily as needed for anxiety. 12/02/23 12/01/24  Bennet Brasil, MD  ipratropium-albuterol  (DUONEB) 0.5-2.5 (3) MG/3ML SOLN Take 3 mLs by nebulization every 8 (eight) hours. 12/17/22     lactulose (CHRONULAC) 10 GM/15ML solution Take 15 mLs by mouth every 6 (six) hours as needed for mild constipation. 11/15/23   [provider]  levothyroxine  (SYNTHROID ) 50 MCG tablet Take 1 tablet by mouth daily before breakfast. 11/22/23 05/20/24  Nida, Gebreselassie W, MD  lidocaine  (LIDODERM ) 5 % Place 1 patch onto the skin daily. Remove & Discard patch within 12 hours or as directed by MD 04/09/23   Mason Sole, Pratik D, DO  linaclotide  (LINZESS ) 290 MCG CAPS capsule Take 1 capsule (290 mcg total) by mouth daily before breakfast. 03/28/22   Bennet Brasil, MD  methylPREDNISolone  sodium succinate  (SOLU-MEDROL , PF,) 125 MG injection ACT-O-VIAL Administer as directed.  Giving  IV is preferred, but can also be given IM if IV access is difficult. 11/26/23   Nida, Gebreselassie W, MD  midodrine  (PROAMATINE ) 5 MG tablet Take 1 tablet (5 mg total) by mouth 2 (two) times daily with a meal. 06/19/23 06/18/24  Bennet Brasil, MD  naloxone  (NARCAN ) nasal spray 4 mg/0.1 mL Place 1 spray into the nose as needed. 11/11/23     naloxone  (NARCAN ) nasal spray 4 mg/0.1 mL Use one spray in either nostril once for known/suspected opioid overdose. May repeat every 2-3 minutes in alternating nostril til EMS arrives 12/31/23     naloxone  (NARCAN ) nasal spray 4 mg/0.1 mL Use 1 spray in nose as directed as needed for accidental overdose 12/31/23     naloxone  (NARCAN ) nasal spray 4 mg/0.1 mL Use 1 spray in nose as directed as needed for accidental overdose 12/31/23     ondansetron  (ZOFRAN -ODT) 8 MG disintegrating tablet Dissolve 1 tablet (8 mg total) by mouth every 8 (eight) hours as needed. 04/23/23 04/22/24  Bennet Brasil, MD  oxyCODONE -acetaminophen  (PERCOCET) 10-325 MG tablet Take 1 tablet by mouth every 4 (four) hours as needed for pain. 01/01/24     OXYGEN  Inhale 5 L into the lungs continuous.    [provider]  pantoprazole  (PROTONIX ) 40 MG tablet Take 1 tablet (40 mg total) by mouth 2 (two) times daily before a meal. 08/13/23 08/12/24  Bennet Brasil, MD  predniSONE  (DELTASONE ) 5 MG tablet Take 1 tablet (5 mg total) by mouth in the morning. 08/01/23     rosuvastatin  (CRESTOR ) 20 MG tablet Take 1 tablet (20 mg total) by mouth daily. 08/12/23   Bennet Brasil, MD  senna-docusate (SENOKOT-S) 8.6-50 MG tablet Take 1 tablet by mouth 2 (two) times daily between meals as needed for mild constipation. 03/13/23   Gonfa, Taye T, MD  senna-docusate (SENOKOT-S) 8.6-50 MG tablet Take 1 tablet by mouth 2 (two) times daily as needed for constipation. 12/31/23     tamsulosin  (FLOMAX ) 0.4 MG CAPS capsule Take 1 capsule (0.4 mg total) by mouth daily after supper. 12/11/23   Demaris Fillers, MD  traZODone  (DESYREL )  50 MG tablet Take 1 tablet (50 mg total) by mouth at bedtime. 12/31/23  Past Surgical History Past Surgical History:  Procedure Laterality Date   BIOPSY  10/17/2018   Procedure: BIOPSY;  Surgeon: Ruby Corporal, MD;  Location: [facility];  Service: Endoscopy;;  duodenum, antrum, gastric body   BIOPSY  03/03/2021   Procedure: BIOPSY;  Surgeon: Ruby Corporal, MD;  Location: [facility];  Service: Endoscopy;;   BIOPSY  09/11/2023   Procedure: BIOPSY;  Surgeon: Suzette Espy, MD;  Location: [facility];  Service: Endoscopy;;   BREAST LUMPECTOMY Bilateral 01/12/2011   CHOLECYSTECTOMY N/A 04/17/2021   Procedure: LAPAROSCOPIC CHOLECYSTECTOMY;  Surgeon: Alanda Allegra, MD;  Location: [facility];  Service: General;  Laterality: N/A;   COLONOSCOPY WITH PROPOFOL  N/A 01/03/2022   Procedure: COLONOSCOPY WITH PROPOFOL ;  Surgeon: Ruby Corporal, MD;  Location: [facility];  Service: Endoscopy;  Laterality: N/A;  220   ESOPHAGEAL DILATION N/A 03/03/2021   Procedure: ESOPHAGEAL DILATION;  Surgeon: Ruby Corporal, MD;  [facility]: [facility];  Service: Endoscopy;  Laterality: N/A;   ESOPHAGOGASTRODUODENOSCOPY (EGD) WITH PROPOFOL  N/A 10/17/2018   Procedure: ESOPHAGOGASTRODUODENOSCOPY (EGD) WITH PROPOFOL ;  Surgeon: Ruby Corporal, MD;  [facility]: [facility];  Service: Endoscopy;  Laterality: N/A;  2:25   ESOPHAGOGASTRODUODENOSCOPY (EGD) WITH PROPOFOL  N/A 03/03/2021   Procedure: ESOPHAGOGASTRODUODENOSCOPY (EGD) WITH PROPOFOL ;  Surgeon: Ruby Corporal, MD;  [facility];  Service: Endoscopy;  Laterality: N/A;  12:15   ESOPHAGOGASTRODUODENOSCOPY (EGD) WITH PROPOFOL  N/A 09/11/2023   Procedure: ESOPHAGOGASTRODUODENOSCOPY (EGD) WITH PROPOFOL ;  Surgeon: Suzette Espy, MD;  [facility];  Service: Endoscopy;  Laterality: N/A;  2:30 pm, asa 3, LM to  see if pt can come earlier   [facility] DILATION N/A 09/11/2023   Procedure: Londa Rival DILATION;  Surgeon: Suzette Espy, MD;  [facility]: [facility];  Service: Endoscopy;  Laterality: N/A;   ORIF ANKLE FRACTURE Left 03/10/2023   Procedure: OPEN REDUCTION INTERNAL FIXATION (ORIF) ANKLE FRACTURE;  Surgeon: Diedra Fowler, MD;  Location: [facility];  Service: Orthopedics;  Laterality: Left;   POLYPECTOMY  01/03/2022   Procedure: POLYPECTOMY INTESTINAL;  Surgeon: Ruby Corporal, MD;  [facility]: [facility];  Service: Endoscopy;;   TUBAL LIGATION     Family History Family History  Problem Relation Age of Onset   Cardiomyopathy Mother    Breast cancer Mother    Prostate cancer Father    Heart attack Maternal Grandmother    Heart attack Maternal Grandfather    Heart attack Paternal Grandmother    Bipolar disorder Daughter    Heart disease Maternal Aunt    Neurofibromatosis Cousin    Colon cancer Neg Hx     Social History Social History   Tobacco Use   Smoking status: Never    Passive exposure: Never   Smokeless tobacco: Never  Vaping Use   Vaping status: Never Used  Substance Use Topics   Alcohol use: No   Drug use: No   Allergies Bee venom, Contrast media [iodinated contrast media], Morphine , Bactrim  [sulfamethoxazole -trimethoprim ], and Codeine  Review of Systems Review of Systems  All other systems reviewed and are negative.   Physical Exam Vital Signs  I have reviewed the triage vital signs BP (!) 149/67   Pulse 74   Temp 97.9 F (36.6 C) (Oral)   Resp 17   LMP 05/17/2016   SpO2 98%  Physical Exam Vitals and nursing note reviewed.  Constitutional:      General: She is not in acute distress.  Appearance: She is well-developed.  HENT:     Head: Normocephalic and atraumatic.     Mouth/Throat:     Mouth: Mucous membranes are moist.  Eyes:     Pupils: Pupils are equal, round, and reactive to light.  Cardiovascular:     Rate and Rhythm: Normal rate and regular  rhythm.     Heart sounds: No murmur heard. Pulmonary:     Effort: Pulmonary effort is normal. No respiratory distress.     Breath sounds: Normal breath sounds.  Abdominal:     General: Abdomen is flat.     Palpations: Abdomen is soft.     Tenderness: There is no abdominal tenderness.  Musculoskeletal:        General: No tenderness.     Right lower leg: No edema.     Left lower leg: No edema.     Comments: No midline C or T-spine tenderness, mild lumbar tenderness in the lower lumbar spine.  No step-off.  Strength 5 out of 5 in the bilateral lower extremities.  Skin:    General: Skin is warm and dry.  Neurological:     General: No focal deficit present.     Mental Status: She is alert. Mental status is at baseline.  Psychiatric:        Mood and Affect: Mood normal.        Behavior: Behavior normal.     ED Results and Treatments Labs (all labs ordered are listed, but only abnormal results are displayed) Labs Reviewed  BASIC METABOLIC PANEL WITH GFR - Abnormal; Notable for the following components:      Result Value   Chloride 97 (*)    CO2 37 (*)    Glucose, Bld 125 (*)    All other components within normal limits  CBC WITH DIFFERENTIAL/PLATELET - Abnormal; Notable for the following components:   RBC 3.20 (*)    Hemoglobin 9.1 (*)    HCT 31.2 (*)    MCHC 29.2 (*)    nRBC 0.7 (*)    Abs Immature Granulocytes 0.38 (*)    All other components within normal limits  URINALYSIS, W/ REFLEX TO CULTURE (INFECTION SUSPECTED) - Abnormal; Notable for the following components:   Color, Urine STRAW (*)    Specific Gravity, Urine 1.003 (*)    All other components within normal limits  TROPONIN I (HIGH SENSITIVITY)                                                                                                                          Radiology MR LUMBAR SPINE WO CONTRAST Result Date: 01/03/2024 CLINICAL DATA:  Initial evaluation for acute low back pain, injury earlier today. EXAM:  MRI LUMBAR SPINE WITHOUT CONTRAST TECHNIQUE: Multiplanar, multisequence MR imaging of the lumbar spine was performed. No intravenous contrast was administered. COMPARISON:  Prior study from 12/07/2023 FINDINGS: Segmentation: Transitional lumbosacral anatomy with partial sacralization of the L5 vertebral body on the left. Same numbering system  employed as on previous exam. Alignment: Vertebral bodies normally aligned with preservation of the normal cervical lordosis. No significant interval listhesis or malalignment. Vertebrae: Previously identified acute to subacute compression fracture involving the superior endplate of L4 again seen. Associated height loss is relatively stable from prior persistent marrow edema without significant progressive height loss since prior. Associated 3 mm bony retropulsion, similar. There is a new acute compression fracture involving the superior endplate of L3. Associated height loss measures up to 30% centrally with no more than trace 2 mm retropulsion. This is benign/mechanical in appearance. Vertebral body height otherwise maintained. Bone marrow signal intensity within normal limits. No worrisome osseous lesions. Mild reactive edema about the S2 and S3 segments without fracture deformity, stable. Conus medullaris and cauda equina: Conus extends to the T12-L1 level. Conus and cauda equina appear normal. Paraspinal and other soft tissues: Paraspinous soft tissues demonstrate no acute finding. Subcentimeter T2 hyperintense cyst at the lower pole of the left kidney, benign in appearance, no follow-up imaging recommended. Disc levels: T11-12: Seen only on sagittal projection. Disc desiccation with small central disc protrusion. No stenosis. T12-L1: Unremarkable. L1-2: Unremarkable. L2-3: Acute compression fracture involving the superior endplate of L3 with no more than trace 2 mm bony retropulsion. Mild facet hypertrophy. No significant spinal stenosis. Foramina remain patent. L3-4:  Disc desiccation without significant disc bulge. 3 mm bony retropulsion related to the acute to subacute L4 compression fracture. Mild bilateral facet hypertrophy. Resultant mild narrowing of the lateral recesses bilaterally. Central canal remains patent. No significant foraminal stenosis. L4-5: Disc desiccation with minimal annular bulge. Mild facet hypertrophy. Epidural lipomatosis. No significant canal or foraminal stenosis. L5-S1: Normal interspace. Epidural lipomatosis. No canal or foraminal stenosis. IMPRESSION: 1. Acute compression fracture involving the superior endplate of L3 with up to 30% height loss and trace 2 mm bony retropulsion. No significant stenosis. This is new as compared to recent MRI from 12/07/2023. 2. Acute to subacute compression fracture involving the superior endplate of L4 with associated 3 mm bony retropulsion, not significantly changed from prior. 3. Underlying mild for age degenerative spondylosis without significant stenosis or neural impingement. Electronically Signed   By: Virgia Griffins M.D.   On: 01/03/2024 20:06   DG Chest Port 1 View Result Date: 01/03/2024 CLINICAL DATA:  Patient presents after "twisting her back today". EXAM: PORTABLE CHEST 1 VIEW COMPARISON:  August 05, 2023 FINDINGS: Limited study secondary to overlying breast attenuation artifact. The cardiac silhouette is limited in evaluation but appears to be stable. There is mild to moderate severity prominence of the central pulmonary vasculature with mild diffusely increased interstitial lung markings. No pleural effusion or pneumothorax is identified. The visualized skeletal structures are unremarkable. IMPRESSION: 1. Limited study secondary to overlying breast attenuation artifact. 2. Pulmonary vascular congestion. A component of superimposed interstitial edema cannot be excluded. Electronically Signed   By: Virgle Grime M.D.   On: 01/03/2024 19:20    Pertinent labs & imaging results that were  available during my care of the patient were reviewed by me and considered in my medical decision making (see MDM for details).  Medications Ordered in ED Medications  HYDROmorphone  (DILAUDID ) injection 1 mg (1 mg Intravenous Given 01/03/24 1756)  HYDROmorphone  (DILAUDID ) injection 0.5 mg (0.5 mg Intravenous Given 01/03/24 1931)  Procedures Procedures  (including critical care time)  Medical Decision Making / ED Course   MDM:  63 year old presenting to the emergency department for low back pain.  Patient overall well-appearing, does have some midline lumbar tenderness.  This appears to be similar to what patient had when she was recently admitted for compression fracture.  She had urinary retention also at that time and had an MRI which did not show cauda equina syndrome.  However given urinary retention difficult to fully exclude this especially with recent fracture so obtain repeat MRI lumbar spine.  Patient denies any fevers or chills to suggest underlying infectious process such as spinal epidural abscess or discitis/osteomyelitis.  Will reassess.  Will treat pain.  Patient is also complaining of vague chest pain.  She denies any pleuritic pain.  Will check troponin, EKG, chest x-ray.  Overall concern for dangerous process is low.  Doubt pneumothorax or pneumonia.  Patient not hypoxic or tachycardic, denies pleuritic pain or shortness of breath, doubt PE.  Doubt dissection.  Will reassess.   Clinical Course as of 01/03/24 2050  Fri Jan 03, 2024  2046 MRI shows new L3 compression fracture. Pt still having pain and doesn't feel safe to go home. No sign of any spinal cord compression. Discussed with Dr. Osborne Blazer who admitted patient for further management.  [WS]    Clinical Course User Index [WS] Mordecai Applebaum, MD     Additional history  obtained: -Additional history obtained from ems and spouse -External records from outside source obtained and reviewed including: Chart review including previous notes, labs, imaging, consultation notes including prior notes    Lab Tests: -I ordered, reviewed, and interpreted labs.   The pertinent results include:   Labs Reviewed  BASIC METABOLIC PANEL WITH GFR - Abnormal; Notable for the following components:      Result Value   Chloride 97 (*)    CO2 37 (*)    Glucose, Bld 125 (*)    All other components within normal limits  CBC WITH DIFFERENTIAL/PLATELET - Abnormal; Notable for the following components:   RBC 3.20 (*)    Hemoglobin 9.1 (*)    HCT 31.2 (*)    MCHC 29.2 (*)    nRBC 0.7 (*)    Abs Immature Granulocytes 0.38 (*)    All other components within normal limits  URINALYSIS, W/ REFLEX TO CULTURE (INFECTION SUSPECTED) - Abnormal; Notable for the following components:   Color, Urine STRAW (*)    Specific Gravity, Urine 1.003 (*)    All other components within normal limits  TROPONIN I (HIGH SENSITIVITY)    Notable for chronic anemia, UTI  EKG   EKG Interpretation Date/Time:  Friday Jan 03 2024 18:02:26 EDT Ventricular Rate:  99 PR Interval:  123 QRS Duration:  57 QT Interval:  372 QTC Calculation: 478 R Axis:   66  Text Interpretation: Normal sinus rhythm Atrial premature complexes Low voltage, precordial leads Abnormal R-wave progression, early transition Borderline T wave abnormalities Compared to previous tracing Atrial premature complexes are new Confirmed by Hiawatha Lout ([id number]) on 01/03/2024 6:19:46 PM         Imaging Studies ordered: I ordered imaging studies including MRI lumbar spine  On my interpretation imaging demonstrates L3 fracture  I independently visualized and interpreted imaging. I agree with the radiologist interpretation   Medicines ordered and prescription drug management: Meds ordered this encounter  Medications    HYDROmorphone  (DILAUDID ) injection 1 mg   HYDROmorphone  (DILAUDID ) injection 0.5 mg    -  I have reviewed the patients home medicines and have made adjustments as needed   Consultations Obtained: I requested consultation with the hospitalist ,  and discussed lab and imaging findings as well as pertinent plan - they recommend: admission   Cardiac Monitoring: The patient was maintained on a cardiac monitor.  I personally viewed and interpreted the cardiac monitored which showed an underlying rhythm of: NSR  Reevaluation: After the interventions noted above, I reevaluated the patient and found that their symptoms have improved  Co morbidities that complicate the patient evaluation  Past Medical History:  Diagnosis Date   Adrenal insufficiency (HCC)    Anemia    Anxiety    Avascular bone necrosis (HCC)    Breast fibrocystic disorder    Chronic pain    Encounter for long-term opiate analgesic use 02/02/2020   Patient has chronic pain with femoral head avascular necrosis.  Is under pain contract   Fibromyalgia    Gastroesophageal reflux disease    Hypothyroidism    Mitral valve prolapse    Orthostatic hypotension    Peripheral neuropathy    PONV (postoperative nausea and vomiting)    Restrictive lung disease    Moderate to severe   Sarcoidosis    Biopsy proven - [facility]   Sarcoidosis    SOB (shortness of breath)    chronic      Dispostion: Disposition decision including need for hospitalization was considered, and patient admitted to the hospital.    Final Clinical Impression(s) / ED Diagnoses Final diagnoses:  Closed compression fracture of L3 lumbar vertebra, initial encounter [facility])     This chart was dictated using voice recognition software.  Despite best efforts to proofread,  errors can occur which can change the documentation meaning.    Mordecai Applebaum, MD 01/03/24 2050

## 2024-01-03 NOTE — H&P (Signed)
 [facility] H&P   Patient Demographics:    Kathleen [name], is a 63 y.o. female  MRN: [id number]   DOB - 02-03-1961  Admit Date - 01/03/2024  Outpatient Primary MD for the patient is Bennet Brasil, MD  Referring MD/NP/PA: Dr. Joli Neas  Outpatient Specialists: Neurosurgery Dr. Mort Ards from [facility]  Patient coming from: Home  Chief Complaint  Patient presents with   Back Pain      HPI:    Kathleen [name]  is a 63 y.o. female,  with medical history significant for chronic respiratory failure on 5 L, interstitial lung disease, sarcoidosis, Addison's disease, chronic pain syndrome, anxiety and depression, hypothyroidism.  Patient with recent admission secondary to L4 compression fracture and intractable back pain, she was discharged to SNF, was just discharged from SNF 3 days ago, and did follow-up with her orthopedic/neurosurgery recently, where she will need medical clearance given her multiple comorbidities for considering any neurosurgery . -Patient presents to ED secondary to worsening back pain, patient reports she got on the commode, twisted her back, then she developed severe lower back pain, reports similar to previous back pain, she also reports some urinary retention, which she had previously during recent admission with her severe back pain, she denies any urinary or stool incontinence, she also reports constipation which is chronic, no fever, no chills, no abdominal pain, no lower extremity weakness, no saddle paresthesia,. - NAD MRI of lumbar spine showing evidence of Acute compression fracture of superior endplate of L3, Acute to subacute compression fracture of superior endplate of L4, she required multiple doses of IV pain medication for pain control, she was noted to have urinary retention where she had 600 cc output with In-N-Out, [facility] consulted to admit   Review  of systems:     A full 10 point Review of Systems was done, except as stated above, all other Review of Systems were negative.   With Past History of the following :    Past Medical History:  Diagnosis Date   Adrenal insufficiency (HCC)    Anemia    Anxiety    Avascular bone necrosis (HCC)    Breast fibrocystic disorder    Chronic pain    Encounter for long-term opiate analgesic use 02/02/2020   Patient has chronic pain with femoral head avascular necrosis.  Is under pain contract   Fibromyalgia    Gastroesophageal reflux disease    Hypothyroidism    Mitral valve prolapse    Orthostatic hypotension    Peripheral neuropathy    PONV (postoperative nausea and vomiting)    Restrictive lung disease    Moderate to severe   Sarcoidosis    Biopsy proven - [facility]   Sarcoidosis    SOB (shortness of breath)    chronic      Past Surgical History:  Procedure Laterality Date   BIOPSY  10/17/2018   Procedure:  BIOPSY;  Surgeon: Ruby Corporal, MD;  [facility];  Service: Endoscopy;;  duodenum, antrum, gastric body   BIOPSY  03/03/2021   Procedure: BIOPSY;  Surgeon: Ruby Corporal, MD;  [facility];  Service: Endoscopy;;   BIOPSY  09/11/2023   Procedure: BIOPSY;  Surgeon: Suzette Espy, MD;  [facility];  Service: Endoscopy;;   BREAST LUMPECTOMY Bilateral 01/12/2011   CHOLECYSTECTOMY N/A 04/17/2021   Procedure: LAPAROSCOPIC CHOLECYSTECTOMY;  Surgeon: Alanda Allegra, MD;  Location: [facility];  Service: General;  Laterality: N/A;   COLONOSCOPY WITH PROPOFOL  N/A 01/03/2022   Procedure: COLONOSCOPY WITH PROPOFOL ;  Surgeon: Ruby Corporal, MD;  [facility];  Service: Endoscopy;  Laterality: N/A;  220   ESOPHAGEAL DILATION N/A 03/03/2021   Procedure: ESOPHAGEAL DILATION;  Surgeon: Ruby Corporal, MD;  [facility];  Service: Endoscopy;  Laterality: N/A;   ESOPHAGOGASTRODUODENOSCOPY (EGD) WITH PROPOFOL  N/A 10/17/2018   Procedure:  ESOPHAGOGASTRODUODENOSCOPY (EGD) WITH PROPOFOL ;  Surgeon: Ruby Corporal, MD;  [facility];  Service: Endoscopy;  Laterality: N/A;  2:25   ESOPHAGOGASTRODUODENOSCOPY (EGD) WITH PROPOFOL  N/A 03/03/2021   Procedure: ESOPHAGOGASTRODUODENOSCOPY (EGD) WITH PROPOFOL ;  Surgeon: Ruby Corporal, MD;  [facility];  Service: Endoscopy;  Laterality: N/A;  12:15   ESOPHAGOGASTRODUODENOSCOPY (EGD) WITH PROPOFOL  N/A 09/11/2023   Procedure: ESOPHAGOGASTRODUODENOSCOPY (EGD) WITH PROPOFOL ;  Surgeon: Suzette Espy, MD;  [facility];  Service: Endoscopy;  Laterality: N/A;  2:30 pm, asa 3, LM to see if pt can come earlier   [facility] DILATION N/A 09/11/2023   Procedure: Londa Rival DILATION;  Surgeon: Suzette Espy, MD;  [facility];  Service: Endoscopy;  Laterality: N/A;   ORIF ANKLE FRACTURE Left 03/10/2023   Procedure: OPEN REDUCTION INTERNAL FIXATION (ORIF) ANKLE FRACTURE;  Surgeon: Diedra Fowler, MD;  Location: [facility];  Service: Orthopedics;  Laterality: Left;   POLYPECTOMY  01/03/2022   Procedure: POLYPECTOMY INTESTINAL;  Surgeon: Ruby Corporal, MD;  [facility];  Service: Endoscopy;;   TUBAL LIGATION        Social History:     Social History   Tobacco Use   Smoking status: Never    Passive exposure: Never   Smokeless tobacco: Never  Substance Use Topics   Alcohol use: No       Family History :     Family History  Problem Relation Age of Onset   Cardiomyopathy Mother    Breast cancer Mother    Prostate cancer Father    Heart attack Maternal Grandmother    Heart attack Maternal Grandfather    Heart attack Paternal Grandmother    Bipolar disorder Daughter    Heart disease Maternal Aunt    Neurofibromatosis Cousin    Colon cancer Neg Hx       Home Medications:   Prior to Admission medications   Medication Sig Start Date End Date Taking? Authorizing Provider  acetaminophen  (TYLENOL ) 325 MG tablet Take 2 tablets (650 mg  total) by mouth every 6 (six) hours. 12/11/23   Demaris Fillers, MD  acetaminophen  (TYLENOL ) 325 MG tablet Take 2 tablets (650 mg total) by mouth every 6 (six) hours. 12/31/23     albuterol  (PROVENTIL ) (2.5 MG/3ML) 0.083% nebulizer solution Take 3 mLs (2.5 mg total) by nebulization every 4 (four) hours as needed for wheezing or shortness of breath. 12/15/22 12/15/23  Colin Dawley, MD  albuterol  (PROVENTIL ) (2.5 MG/3ML) 0.083% nebulizer  solution Inhale 3 mL (2.5 mg total) by nebulization every four (4) hours as needed for wheezing or shortness of breath. 12/31/23     albuterol  (VENTOLIN  HFA) 108 (90 Base) MCG/ACT inhaler Inhale 2 puffs into the lungs every 6 (six) hours as needed for wheezing or shortness of breath. 12/15/22   Colin Dawley, MD  albuterol  (VENTOLIN  HFA) 108 (90 Base) MCG/ACT inhaler Inhale 2 puffs every six (6) hours as needed for wheezing or shortness of breath. 12/31/23     atorvastatin  (LIPITOR) 40 MG tablet Take 1 tablet (40 mg total) by mouth nightly 12/31/23     budesonide  (PULMICORT ) 0.5 MG/2ML nebulizer solution Inhale the contents of 1 vial via nebulizer two (2) times a day. 11/22/21     budesonide  (PULMICORT ) 0.5 MG/2ML nebulizer solution Inhale 2 mL (0.5 mg total) by nebulization every twelve (12) hours as needed (asthma). 12/31/23     busPIRone  (BUSPAR ) 15 MG tablet Take 1 tablet (15 mg) by mouth 3 times daily. 10/11/23   Bennet Brasil, MD  busPIRone  (BUSPAR ) 15 MG tablet Take 1 tablet (15 mg total) by mouth 3 (three) times daily. 12/31/23     calcitonin, salmon, (MIACALCIN ) 200 UNIT/ACT nasal spray Use 1 spray in one nostril per day, alternate nostrils every other day (may have 1 vial she should use this for at least 2 weeks but may use as long as 4 weeks) 03/01/23   Bennet Brasil, MD  calcitonin, salmon, (MIACALCIN /FORTICAL) 200 UNIT/ACT nasal spray Use 1 spray into alternating nostrils in the morning. 12/31/23     DULoxetine  (CYMBALTA ) 60 MG capsule Take 1 capsule (60 mg total) by mouth  in the morning. 12/31/23     DULoxetine  (CYMBALTA ) 60 MG capsule Take 1 capsule (60 mg total) by mouth in the morning. 12/31/23     EPINEPHrine  0.3 mg/0.3 mL IJ SOAJ injection Inject 0.3 mg into the muscle as needed for anaphylaxis. 01/05/22   Bennet Brasil, MD  ferrous sulfate  325 (65 FE) MG EC tablet Take 325 mg by mouth once a week.    [provider]  furosemide  (LASIX ) 20 MG tablet Take 1 tablet (20 mg total) by mouth every Monday, Wednesday, and Friday. 07/17/23   Bennet Brasil, MD  furosemide  (LASIX ) 20 MG tablet Take 1 tablet (20 mg total) by mouth every Monday, Wednesday and Friday. 12/31/23     hydrocortisone  (CORTEF ) 10 MG tablet Take 2 tablets by mouth at 8:00 am and 1 tablet at lunch. (May double dose on sick days x 3-5 days) 11/14/23   Nida, Gebreselassie W, MD  HYDROmorphone  (DILAUDID ) 2 MG tablet Take 1 tablet (2 mg total) by mouth every 4 (four) hours as needed for severe pain (pain score 7-10). 12/11/23   Tat, Myrtie Atkinson, MD  hydrOXYzine  (ATARAX ) 25 MG tablet Take 1 tablet (25 mg total) by mouth 3 (three) times daily as needed for anxiety. 12/02/23 12/01/24  Bennet Brasil, MD  ipratropium-albuterol  (DUONEB) 0.5-2.5 (3) MG/3ML SOLN Take 3 mLs by nebulization every 8 (eight) hours. 12/17/22     lactulose (CHRONULAC) 10 GM/15ML solution Take 15 mLs by mouth every 6 (six) hours as needed for mild constipation. 11/15/23   [provider]  levothyroxine  (SYNTHROID ) 50 MCG tablet Take 1 tablet by mouth daily before breakfast. 11/22/23 05/20/24  Nida, Gebreselassie W, MD  lidocaine  (LIDODERM ) 5 % Place 1 patch onto the skin daily. Remove & Discard patch within 12 hours or as directed by MD 04/09/23  Mason Sole, Pratik D, DO  linaclotide  (LINZESS ) 290 MCG CAPS capsule Take 1 capsule (290 mcg total) by mouth daily before breakfast. 03/28/22   Bennet Brasil, MD  methylPREDNISolone  sodium succinate  (SOLU-MEDROL , PF,) 125 MG injection ACT-O-VIAL Administer as directed.  Giving IV is preferred, but can  also be given IM if IV access is difficult. 11/26/23   Nida, Gebreselassie W, MD  midodrine  (PROAMATINE ) 5 MG tablet Take 1 tablet (5 mg total) by mouth 2 (two) times daily with a meal. 06/19/23 06/18/24  Bennet Brasil, MD  naloxone  (NARCAN ) nasal spray 4 mg/0.1 mL Place 1 spray into the nose as needed. 11/11/23     naloxone  (NARCAN ) nasal spray 4 mg/0.1 mL Use one spray in either nostril once for known/suspected opioid overdose. May repeat every 2-3 minutes in alternating nostril til EMS arrives 12/31/23     naloxone  (NARCAN ) nasal spray 4 mg/0.1 mL Use 1 spray in nose as directed as needed for accidental overdose 12/31/23     naloxone  (NARCAN ) nasal spray 4 mg/0.1 mL Use 1 spray in nose as directed as needed for accidental overdose 12/31/23     ondansetron  (ZOFRAN -ODT) 8 MG disintegrating tablet Dissolve 1 tablet (8 mg total) by mouth every 8 (eight) hours as needed. 04/23/23 04/22/24  Bennet Brasil, MD  oxyCODONE -acetaminophen  (PERCOCET) 10-325 MG tablet Take 1 tablet by mouth every 4 (four) hours as needed for pain. 01/01/24     OXYGEN  Inhale 5 L into the lungs continuous.    [provider]  pantoprazole  (PROTONIX ) 40 MG tablet Take 1 tablet (40 mg total) by mouth 2 (two) times daily before a meal. 08/13/23 08/12/24  Bennet Brasil, MD  predniSONE  (DELTASONE ) 5 MG tablet Take 1 tablet (5 mg total) by mouth in the morning. 08/01/23     rosuvastatin  (CRESTOR ) 20 MG tablet Take 1 tablet (20 mg total) by mouth daily. 08/12/23   Bennet Brasil, MD  senna-docusate (SENOKOT-S) 8.6-50 MG tablet Take 1 tablet by mouth 2 (two) times daily between meals as needed for mild constipation. 03/13/23   Gonfa, Taye T, MD  senna-docusate (SENOKOT-S) 8.6-50 MG tablet Take 1 tablet by mouth 2 (two) times daily as needed for constipation. 12/31/23     tamsulosin  (FLOMAX ) 0.4 MG CAPS capsule Take 1 capsule (0.4 mg total) by mouth daily after supper. 12/11/23   Demaris Fillers, MD  traZODone  (DESYREL ) 50 MG tablet Take 1 tablet  (50 mg total) by mouth at bedtime. 12/31/23        Allergies:     Allergies  Allergen Reactions   Bee Venom Anaphylaxis   Contrast Media [Iodinated Contrast Media] Other (See Comments)    CT chest w/ contrast at OSH followed by SOB resolved w/ single dose steroids, never ENT swelling, intubation or pressors, has had multiple contrasted scans since   Morphine  Other (See Comments)    Substance with morphinan structure and opioid receptor agonist mechanism of action (substance)   Bactrim  [Sulfamethoxazole -Trimethoprim ] Nausea And Vomiting   Codeine Itching     Physical Exam:   Vitals  Blood pressure (!) 149/67, pulse 74, temperature 97.9 F (36.6 C), temperature source Oral, resp. rate 17, last menstrual period 05/17/2016, SpO2 98%.   1. General Developed female, with morbid obesity, laying in bed  2. Normal affect and insight, Not Suicidal or Homicidal, Awake Alert, Oriented X 3.  3. No F.N deficits, ALL C.Nerves Intact, Strength 5/5 all 4 extremities, Sensation intact all 4 extremities, Plantars down going.  (  Motor strength limited by pain)  4. Ears and Eyes appear Normal, Conjunctivae clear, PERRLA. Moist Oral Mucosa.  5. Supple Neck, No JVD, No cervical lymphadenopathy appriciated, No Carotid Bruits.  6. Symmetrical Chest wall movement, Good air movement bilaterally, scattered Rales  7. RRR, No Gallops, Rubs or Murmurs, No Parasternal Heave.  8. Positive Bowel Sounds, Abdomen Soft, No tenderness, No organomegaly appriciated,No rebound -guarding or rigidity.  9.  No Cyanosis, Normal Skin Turgor, No Skin Rash or Bruise.  10. Good muscle tone,  joints appear normal , no effusions, Normal ROM.     Data Review:    CBC Recent Labs  Lab 01/03/24 1756  WBC 9.5  HGB 9.1*  HCT 31.2*  PLT 367  MCV 97.5  MCH 28.4  MCHC 29.2*  RDW 15.5  LYMPHSABS 0.7  MONOABS 0.7  EOSABS 0.1  BASOSABS 0.1    ------------------------------------------------------------------------------------------------------------------  Chemistries  Recent Labs  Lab 01/03/24 1756  NA 141  K 4.3  CL 97*  CO2 37*  GLUCOSE 125*  BUN 9  CREATININE 0.68  CALCIUM  9.3   ------------------------------------------------------------------------------------------------------------------ CrCl cannot be calculated (Unknown ideal weight.). ------------------------------------------------------------------------------------------------------------------ No results for input(s): "TSH", "T4TOTAL", "T3FREE", "THYROIDAB" in the last 72 hours.  Invalid input(s): "FREET3"  Coagulation profile No results for input(s): "INR", "PROTIME" in the last 168 hours. ------------------------------------------------------------------------------------------------------------------- No results for input(s): "DDIMER" in the last 72 hours. -------------------------------------------------------------------------------------------------------------------  Cardiac Enzymes No results for input(s): "CKMB", "TROPONINI", "MYOGLOBIN" in the last 168 hours.  Invalid input(s): "CK" ------------------------------------------------------------------------------------------------------------------    Component Value Date/Time   BNP 91.0 08/05/2023 0911     ---------------------------------------------------------------------------------------------------------------  Urinalysis    Component Value Date/Time   COLORURINE STRAW (A) 01/03/2024 1738   APPEARANCEUR CLEAR 01/03/2024 1738   LABSPEC 1.003 (L) 01/03/2024 1738   PHURINE 7.0 01/03/2024 1738   GLUCOSEU NEGATIVE 01/03/2024 1738   GLUCOSEU NEGATIVE 05/29/2010 1622   HGBUR NEGATIVE 01/03/2024 1738   BILIRUBINUR NEGATIVE 01/03/2024 1738   BILIRUBINUR + 11/08/2020 1604   KETONESUR NEGATIVE 01/03/2024 1738   PROTEINUR NEGATIVE 01/03/2024 1738   UROBILINOGEN 1.0 04/30/2020  0831   UROBILINOGEN 0.2 06/16/2014 0942   NITRITE NEGATIVE 01/03/2024 1738   LEUKOCYTESUR NEGATIVE 01/03/2024 1738    ----------------------------------------------------------------------------------------------------------------   Imaging Results:    MR LUMBAR SPINE WO CONTRAST Result Date: 01/03/2024 CLINICAL DATA:  Initial evaluation for acute low back pain, injury earlier today. EXAM: MRI LUMBAR SPINE WITHOUT CONTRAST TECHNIQUE: Multiplanar, multisequence MR imaging of the lumbar spine was performed. No intravenous contrast was administered. COMPARISON:  Prior study from 12/07/2023 FINDINGS: Segmentation: Transitional lumbosacral anatomy with partial sacralization of the L5 vertebral body on the left. Same numbering system employed as on previous exam. Alignment: Vertebral bodies normally aligned with preservation of the normal cervical lordosis. No significant interval listhesis or malalignment. Vertebrae: Previously identified acute to subacute compression fracture involving the superior endplate of L4 again seen. Associated height loss is relatively stable from prior persistent marrow edema without significant progressive height loss since prior. Associated 3 mm bony retropulsion, similar. There is a new acute compression fracture involving the superior endplate of L3. Associated height loss measures up to 30% centrally with no more than trace 2 mm retropulsion. This is benign/mechanical in appearance. Vertebral body height otherwise maintained. Bone marrow signal intensity within normal limits. No worrisome osseous lesions. Mild reactive edema about the S2 and S3 segments without fracture deformity, stable. Conus medullaris and cauda equina: Conus extends to the T12-L1 level. Conus and cauda equina appear normal.  Paraspinal and other soft tissues: Paraspinous soft tissues demonstrate no acute finding. Subcentimeter T2 hyperintense cyst at the lower pole of the left kidney, benign in appearance, no  follow-up imaging recommended. Disc levels: T11-12: Seen only on sagittal projection. Disc desiccation with small central disc protrusion. No stenosis. T12-L1: Unremarkable. L1-2: Unremarkable. L2-3: Acute compression fracture involving the superior endplate of L3 with no more than trace 2 mm bony retropulsion. Mild facet hypertrophy. No significant spinal stenosis. Foramina remain patent. L3-4: Disc desiccation without significant disc bulge. 3 mm bony retropulsion related to the acute to subacute L4 compression fracture. Mild bilateral facet hypertrophy. Resultant mild narrowing of the lateral recesses bilaterally. Central canal remains patent. No significant foraminal stenosis. L4-5: Disc desiccation with minimal annular bulge. Mild facet hypertrophy. Epidural lipomatosis. No significant canal or foraminal stenosis. L5-S1: Normal interspace. Epidural lipomatosis. No canal or foraminal stenosis. IMPRESSION: 1. Acute compression fracture involving the superior endplate of L3 with up to 30% height loss and trace 2 mm bony retropulsion. No significant stenosis. This is new as compared to recent MRI from 12/07/2023. 2. Acute to subacute compression fracture involving the superior endplate of L4 with associated 3 mm bony retropulsion, not significantly changed from prior. 3. Underlying mild for age degenerative spondylosis without significant stenosis or neural impingement. Electronically Signed   By: Virgia Griffins M.D.   On: 01/03/2024 20:06   DG Chest Port 1 View Result Date: 01/03/2024 CLINICAL DATA:  Patient presents after "twisting her back today". EXAM: PORTABLE CHEST 1 VIEW COMPARISON:  August 05, 2023 FINDINGS: Limited study secondary to overlying breast attenuation artifact. The cardiac silhouette is limited in evaluation but appears to be stable. There is mild to moderate severity prominence of the central pulmonary vasculature with mild diffusely increased interstitial lung markings. No pleural  effusion or pneumothorax is identified. The visualized skeletal structures are unremarkable. IMPRESSION: 1. Limited study secondary to overlying breast attenuation artifact. 2. Pulmonary vascular congestion. A component of superimposed interstitial edema cannot be excluded. Electronically Signed   By: Virgle Grime M.D.   On: 01/03/2024 19:20     Assessment & Plan:    Principal Problem:   Intractable low back pain Active Problems:   Chronic respiratory failure with hypoxia (HCC)   Fibromyalgia   Chronic pain syndrome   Hypothyroidism   GERD (gastroesophageal reflux disease)   Vitamin D  deficiency   Adrenal insufficiency (Addison's disease) (HCC)   Ambulatory dysfunction   Intractable lower back pain Acute compression fracture of superior endplate of L3 Acute to subacute compression fracture of superior endplate of L4 - Fall, no trauma, this is most likely in the setting of her chronic steroid use. - Intractable lower back pain, but no focal deficits noted on physical exam, denies any paresthesias, stool incontinence(see discussion below under urinary incontinence) - Will consult PT, OT, with recommendation to keep her LSO brace upon sitting [place]ump and activity, she already brought heller LSO brace from home -Keep on as needed meds Percocet for moderate pain, as needed IV Dilaudid  0.5 mg IV every 3 hours as needed for severe pain. - Continue with home intranasal calcitonin. - Will start on calcium  and vitamin D  p.o.Aaron Aas - She would benefit from IV bisphosphonates, I have asked her to discuss this with her endocrinologist as an outpatient  Acute urinary retention -She had this during previous admission, resolved, was felt secondary to uncontrolled pain, she is requesting Foley catheter which I will insert for now but will DC in 24 hours when  she is more ambulatory and her pain is better controlled   Adrenal insufficiency (Addison's disease)  - Resumed Solu-Cortef , prednisone  and  midodrine .   Pulmonary Fibrosis and Sarcoidosis - she is on daily prednisone  for this and seems at baseline at this time - out pt follow up with pulmonary   Anxiety and depression Appears anxious likely from uncontrolled pain. -Resumed Cymbalta , hydroxyzine , buspirone    Chronic respiratory failure with hypoxia  OSA - She is on oxygen  5 L at baseline, -does not use CPAP   Hypotension -this is chronic and medically managed with midodrine , will continue  Obesity - Does appear she was trying to be started on Wegovy  by her endocrinologist    Hyperipidemia - Continue with home  statin   DVT Prophylaxis  Lovenox   AM Labs Ordered, also please review Full Orders  Family Communication: Admission, patients condition and plan of care including tests being ordered have been discussed with the patient and husband at bedside who indicate understanding and agree with the plan and Code Status.  Code Status Full  Likely DC to  home versus SNF  Condition GUARDED    Consults called: None  Admission status: Inpatient  Time spent in minutes : 70 minutes   Seena Dadds M.D on 01/03/2024 at 8:52 PM   [facility] - Office  [phone]

## 2024-01-03 NOTE — ED Triage Notes (Addendum)
 Pt bib [facility] due to twisting her back today. No fall. Recent lumbar compression fx. A/o. Back brace on. Needed to be home cathed today for urine and pt stated got out . Pt feels she is constipated, lnbm 2 days ago.

## 2024-01-03 NOTE — ED Notes (Addendum)
 Elgergawy, MD okay with attempting purewick prior to placing indwelling foley catheter if pt able to void. Receiving RN Acie Holiday made aware. Pt also notified that LSO brace only to be applied when sitting up/moving or with PT/OT. Pt verbalizes understanding of plan of care.

## 2024-01-04 DIAGNOSIS — S32030A Wedge compression fracture of third lumbar vertebra, initial encounter for closed fracture: Secondary | ICD-10-CM

## 2024-01-04 DIAGNOSIS — K219 Gastro-esophageal reflux disease without esophagitis: Secondary | ICD-10-CM

## 2024-01-04 DIAGNOSIS — E271 Primary adrenocortical insufficiency: Secondary | ICD-10-CM

## 2024-01-04 DIAGNOSIS — E039 Hypothyroidism, unspecified: Secondary | ICD-10-CM | POA: Diagnosis not present

## 2024-01-04 DIAGNOSIS — R262 Difficulty in walking, not elsewhere classified: Secondary | ICD-10-CM

## 2024-01-04 DIAGNOSIS — G894 Chronic pain syndrome: Secondary | ICD-10-CM

## 2024-01-04 DIAGNOSIS — E559 Vitamin D deficiency, unspecified: Secondary | ICD-10-CM

## 2024-01-04 DIAGNOSIS — M5459 Other low back pain: Secondary | ICD-10-CM | POA: Diagnosis not present

## 2024-01-04 DIAGNOSIS — J9611 Chronic respiratory failure with hypoxia: Secondary | ICD-10-CM

## 2024-01-04 LAB — BASIC METABOLIC PANEL WITH GFR
Anion gap: 11 (ref 5–15)
BUN: 11 mg/dL (ref 8–23)
CO2: 35 mmol/L — ABNORMAL HIGH (ref 22–32)
Calcium: 9.2 mg/dL (ref 8.9–10.3)
Chloride: 95 mmol/L — ABNORMAL LOW (ref 98–111)
Creatinine, Ser: 0.74 mg/dL (ref 0.44–1.00)
GFR, Estimated: >60 mL/min (ref >60)
Glucose, Bld: 156 mg/dL — ABNORMAL HIGH (ref 70–99)
Potassium: 4 mmol/L (ref 3.5–5.1)
Sodium: 141 mmol/L (ref 135–145)

## 2024-01-04 LAB — CBC
HCT: 30.4 % — ABNORMAL LOW (ref 36.0–46.0)
Hemoglobin: 8.7 g/dL — ABNORMAL LOW (ref 12.0–15.0)
MCH: 28.5 pg (ref 26.0–34.0)
MCHC: 28.6 g/dL — ABNORMAL LOW (ref 30.0–36.0)
MCV: 99.7 fL (ref 80.0–100.0)
Platelets: 375 10*3/uL (ref 150–400)
RBC: 3.05 MIL/uL — ABNORMAL LOW (ref 3.87–5.11)
RDW: 15.4 % (ref 11.5–15.5)
WBC: 8.4 10*3/uL (ref 4.0–10.5)
nRBC: 0.6 % — ABNORMAL HIGH (ref 0.0–0.2)

## 2024-01-04 MED ORDER — ONDANSETRON HCL 4 MG/2ML IJ SOLN
4.0000 mg | Freq: Four times a day (QID) | INTRAMUSCULAR | Status: DC | PRN
  Administered 2024-01-04 – 2024-01-08 (×3): 4 mg via INTRAVENOUS
  Filled 2024-01-04 (×4): qty 2

## 2024-01-04 MED ORDER — ENOXAPARIN SODIUM 60 MG/0.6ML IJ SOSY
60.0000 mg | PREFILLED_SYRINGE | INTRAMUSCULAR | Status: DC
  Administered 2024-01-05 – 2024-01-08 (×4): 60 mg via SUBCUTANEOUS
  Filled 2024-01-04 (×4): qty 0.6

## 2024-01-04 MED ORDER — METHOCARBAMOL 500 MG PO TABS
750.0000 mg | ORAL_TABLET | Freq: Three times a day (TID) | ORAL | Status: DC | PRN
  Administered 2024-01-05 – 2024-01-06 (×3): 750 mg via ORAL
  Filled 2024-01-04 (×4): qty 2

## 2024-01-04 MED ORDER — BISACODYL 10 MG RE SUPP
10.0000 mg | Freq: Every day | RECTAL | Status: DC | PRN
  Administered 2024-01-05: 10 mg via RECTAL
  Filled 2024-01-04: qty 1

## 2024-01-04 MED ORDER — IPRATROPIUM-ALBUTEROL 0.5-2.5 (3) MG/3ML IN SOLN
3.0000 mL | Freq: Two times a day (BID) | RESPIRATORY_TRACT | Status: DC
  Administered 2024-01-04 – 2024-01-08 (×9): 3 mL via RESPIRATORY_TRACT
  Filled 2024-01-04 (×9): qty 3

## 2024-01-04 NOTE — Plan of Care (Signed)
 Pt is alert and oriented x 4. Bedrest due to unable to move. Orders for foley placed while in ED. Nurse in ED relayed the Dr said could hold off to see if pt can void with purewick. Pt unable to void. Bladder scan 400. Foley placed. 500 drained within minutes.  Problem: Elimination: Goal: Will not experience complications related to urinary retention Outcome: Not Progressing   Problem: Education: Goal: Knowledge of General Education information will improve Description: Including pain rating scale, medication(s)/side effects and non-pharmacologic comfort measures Outcome: Progressing   Problem: Health Behavior/Discharge Planning: Goal: Ability to manage health-related needs will improve Outcome: Progressing   Problem: Clinical Measurements: Goal: Ability to maintain clinical measurements within normal limits will improve Outcome: Progressing Goal: Will remain free from infection Outcome: Progressing Goal: Diagnostic test results will improve Outcome: Progressing Goal: Respiratory complications will improve Outcome: Progressing Goal: Cardiovascular complication will be avoided Outcome: Progressing   Problem: Activity: Goal: Risk for activity intolerance will decrease Outcome: Progressing   Problem: Nutrition: Goal: Adequate nutrition will be maintained Outcome: Progressing   Problem: Coping: Goal: Level of anxiety will decrease Outcome: Progressing   Problem: Elimination: Goal: Will not experience complications related to bowel motility Outcome: Progressing   Problem: Pain Managment: Goal: General experience of comfort will improve and/or be controlled Outcome: Progressing   Problem: Safety: Goal: Ability to remain free from injury will improve Outcome: Progressing   Problem: Skin Integrity: Goal: Risk for impaired skin integrity will decrease Outcome: Progressing

## 2024-01-04 NOTE — Progress Notes (Signed)
 Pt requesting additional pain meds. Drifting off to sleep during vitals. No meds provided at this time. Dilaudid  not due.   01/04/24 0433  Assess: MEWS Score  Temp 97.9 F (36.6 C)  BP (!) 123/40  MAP (mmHg) (!) 62  Pulse Rate 88  Resp 14  SpO2 100 %  O2 Device Nasal Cannula  Assess: MEWS Score  MEWS Temp 0  MEWS Systolic 0  MEWS Pulse 0  MEWS RR 0  MEWS LOC 0  MEWS Score 0  MEWS Score Color Green  Assess: SIRS CRITERIA  SIRS Temperature  0  SIRS Respirations  0  SIRS Pulse 0  SIRS WBC 0  SIRS Score Sum  0

## 2024-01-04 NOTE — Plan of Care (Signed)

## 2024-01-04 NOTE — Progress Notes (Signed)
 Pt educated on pain medication, respiratory system and how we can make sure not to over medicate. Pt had requesting pain meds respiration 12-14. Drifting off during conversation. DBP soft and MAP soft. BP rechecked before oxy given. PT requested not to get Dilaudid  yet and hold off after education. Heat packs applied. Pt requesting heat pad. Charge Nurse is checking to see if Kpad available then will contact provider.

## 2024-01-04 NOTE — Progress Notes (Signed)
 Progress Note   Patient: Kathleen [name]:[id number] DOB: 1961/02/01 DOA: 01/03/2024     1 DOS: the patient was seen and examined on 01/04/2024   Brief hospital admission narrative: As per H&P written by Dr. Osborne Blazer on 01/03/2024  Lesie Setter  is a 63 y.o. female,  with medical history significant for chronic respiratory failure on 5 L, interstitial lung disease, sarcoidosis, Addison's disease, chronic pain syndrome, anxiety and depression, hypothyroidism.  Patient with recent admission secondary to L4 compression fracture and intractable back pain, she was discharged to SNF, was just discharged from SNF 3 days ago, and did follow-up with her orthopedic/neurosurgery recently, where she will need medical clearance given her multiple comorbidities for considering any neurosurgery . -Patient presents to ED secondary to worsening back pain, patient reports she got on the commode, twisted her back, then she developed severe lower back pain, reports similar to previous back pain, she also reports some urinary retention, which she had previously during recent admission with her severe back pain, she denies any urinary or stool incontinence, she also reports constipation which is chronic, no fever, no chills, no abdominal pain, no lower extremity weakness, no saddle paresthesia,. - NAD MRI of lumbar spine showing evidence of Acute compression fracture of superior endplate of L3, Acute to subacute compression fracture of superior endplate of L4, she required multiple doses of IV pain medication for pain control, she was noted to have urinary retention where she had 600 cc output with In-N-Out, [facility] hospitalist consulted to admit  Assessment and plan 1-intractable lower back pain -Secondary to acute compression fracture of superior endplate of L3; patient also with acute to subacute compression fracture of superior endplate of L4 - There has not been any mechanical falls, trauma or direct injury - Patient at  high risk for fractures in the setting of chronic steroid usage and osteopenia/osteoporosis - Continue as needed analgesics, muscle relaxant, the use of her LSO brace once sitting up and up with activity for support - Continue patient follow-up with orthopedic service and pain clinic. - Calcium /vitamin D  initiated and will benefit of discussing treatment with IV biphosphonate's as an outpatient.  2-acute urinary retention - Continue treatment with Flomax  - Will attempt voiding trial and Foley catheter removal on 01/05/2024 - Patient advised to maintain adequate hydration.  3-adrenal insufficiency - Continue treatment with Solu-Cortef , daily prednisone  and midodrine .  4-pulmonary fibrosis/sarcoidosis - Continue treatment with prednisone  and as needed bronchodilator management - Good saturation on chronic supplementation.  5-anxiety and depression - Continue treatment with Cymbalta , hydroxyzine  and BuSpar .  6-acute respiratory failure with pulm disease/obstructive sleep apnea Will benefit of a sleep study and the use of CPAP/BiPAP nightly - Continue on oxygen  supplementation (5 L at baseline) - No acute distress or exacerbation currently appreciated.  7-class III obesity -Body mass index is 42.93 kg/m. -Outpatient follow-up with endocrinology/obesity management - Patient instructed to be started on Wegovy  or Mounjaro - Low-calorie diet, portion control and increase physical activity discussed with patient.  8-history of chronic hypertension - Continue management with midodrine .  9-constipation - In the setting of decreased physical activity and chronic use of narcotics - Continue treatment with Linzess  (dose adjusted), Colace/MiraLAX  and as needed Dulcolax suppository.   Subjective:  In mild to moderate distress secondary to ongoing back pain.  Patient reports no nausea, no vomiting, no chest pain or shortness of breath.  Foley catheter in place in the setting of acute urinary  retention.  Current Niccoli ill in appearance and  morbidly obese.  Physical Exam: Vitals:   01/04/24 0808 01/04/24 0915 01/04/24 0920 01/04/24 1426  BP:  (!) 126/41 (!) 117/45 (!) 123/59  Pulse:  95  80  Resp:    19  Temp:    97.9 F (36.6 C)  TempSrc:    Oral  SpO2: 99%   100%  Weight:       General exam: Alert, awake, oriented x 3; afebrile. Respiratory system: No wheezing or crackles; good saturation on chronic supplementation. Cardiovascular system:RRR. No rubs or gallops; unable to assess JVD with body habitus. Gastrointestinal system: Abdomen is obese, nondistended, soft and nontender. No organomegaly or masses felt. Normal bowel sounds heard. Central nervous system: Able to move 4 limbs spontaneously; generally deconditioned.  No focal neurological deficits. Extremities: No cyanosis or clubbing. Skin: No petechiae. Psychiatry: Judgement and insight appear normal.  Flat affect appreciated on exam.  Data Reviewed: Basic metabolic panel: Sodium 141, potassium 4.0, chloride 95, bicarb 35, BUN 11, creatinine 0.74 and GFR >60  CBC: WBCs 8.4, hemoglobin 8.7 and platelet count 375K  Family Communication: Dad And mom at bedside.  Disposition: Status is: Inpatient Remains inpatient appropriate because: Continue IV analgesics.  Will follow physical therapy evaluation/recommendation; patient with previous similar admission requiring short-term rehabilitation at a nursing home.  Time spent: 50 minutes  Author: Justina Oman, MD 01/04/2024 5:51 PM  For on call review [email or web address].

## 2024-01-04 NOTE — TOC CM/SW Note (Signed)
 TOC will Transition of Care Department (TOC) has reviewed patient. TOC We will continue to monitor patient advancement through interdisciplinary progression rounds.

## 2024-01-05 DIAGNOSIS — S32030A Wedge compression fracture of third lumbar vertebra, initial encounter for closed fracture: Secondary | ICD-10-CM | POA: Diagnosis not present

## 2024-01-05 DIAGNOSIS — M5459 Other low back pain: Secondary | ICD-10-CM | POA: Diagnosis not present

## 2024-01-05 DIAGNOSIS — E039 Hypothyroidism, unspecified: Secondary | ICD-10-CM | POA: Diagnosis not present

## 2024-01-05 DIAGNOSIS — E271 Primary adrenocortical insufficiency: Secondary | ICD-10-CM | POA: Diagnosis not present

## 2024-01-05 MED ORDER — CHLORHEXIDINE GLUCONATE CLOTH 2 % EX PADS
6.0000 | MEDICATED_PAD | Freq: Every day | CUTANEOUS | Status: DC
  Administered 2024-01-05 – 2024-01-08 (×4): 6 via TOPICAL

## 2024-01-05 MED ORDER — SMOG ENEMA
960.0000 mL | Freq: Once | RECTAL | Status: AC
  Administered 2024-01-05: 960 mL via RECTAL
  Filled 2024-01-05: qty 960

## 2024-01-05 NOTE — Progress Notes (Signed)
 Pt hasn't voided since foley removal 427 cc in bladder. Replace foley per Dr. Michaelene Admire

## 2024-01-05 NOTE — Progress Notes (Signed)
 Progress Note   Patient: Kathleen [name] WUJ:[id number] DOB: [birth year] DOA: 01/03/2024     2 DOS: the patient was seen and examined on 01/05/2024   Brief hospital admission narrative: As per H&P written by Dr. Osborne Blazer on 01/03/2024  Kathleen [name]  is a 63 y.o. female,  with medical history significant for chronic respiratory failure on 5 L, interstitial lung disease, sarcoidosis, Addison's disease, chronic pain syndrome, anxiety and depression, hypothyroidism.  Patient with recent admission secondary to L4 compression fracture and intractable back pain, she was discharged to SNF, was just discharged from SNF 3 days ago, and did follow-up with her orthopedic/neurosurgery recently, where she will need medical clearance given her multiple comorbidities for considering any neurosurgery . -Patient presents to ED secondary to worsening back pain, patient reports she got on the commode, twisted her back, then she developed severe lower back pain, reports similar to previous back pain, she also reports some urinary retention, which she had previously during recent admission with her severe back pain, she denies any urinary or stool incontinence, she also reports constipation which is chronic, no fever, no chills, no abdominal pain, no lower extremity weakness, no saddle paresthesia,. - NAD MRI of lumbar spine showing evidence of Acute compression fracture of superior endplate of L3, Acute to subacute compression fracture of superior endplate of L4, she required multiple doses of IV pain medication for pain control, she was noted to have urinary retention where she had 600 cc output with In-N-Out, [facility] hospitalist consulted to admit  Assessment and plan 1-intractable lower back pain -Secondary to acute compression fracture of superior endplate of L3; patient also with acute to subacute compression fracture of superior endplate of L4 - There has not been any mechanical falls, trauma or direct injury - Patient at  high risk for fractures in the setting of chronic steroid usage and osteopenia/osteoporosis - Continue as needed analgesics, muscle relaxant, the use of her LSO brace once sitting up and up with activity for support - Continue patient follow-up with orthopedic service and pain clinic. - Continue calcium /vitamin D  supplementation and will benefit of discussing treatment with IV biphosphonate's as an outpatient.  2-acute urinary retention - Continue treatment with Flomax  - Will attempt voiding trial and Foley catheter removal on 01/05/2024 - Patient advised to maintain adequate hydration.  3-adrenal insufficiency - Continue treatment with Solu-Cortef , daily prednisone  and midodrine .  4-pulmonary fibrosis/sarcoidosis - Continue treatment with prednisone  and as needed bronchodilator management - Good saturation on chronic supplementation (5 L nasal cannula)..  5-anxiety and depression - Continue treatment with Cymbalta , hydroxyzine  and BuSpar .  6-acute respiratory failure with pulm disease/obstructive sleep apnea Will benefit of a sleep study and the use of CPAP/BiPAP nightly - Continue on oxygen  supplementation (5 L at baseline) - No acute distress or exacerbation currently appreciated.  7-class III obesity -Body mass index is 42.93 kg/m. -Outpatient follow-up with endocrinology/obesity management - Patient instructed to be started on Wegovy  or Mounjaro - Low-calorie diet, portion control and increase physical activity discussed with patient.  8-history of chronic hypertension - Continue management with midodrine .  9-constipation - In the setting of decreased physical activity and chronic use of narcotics - Continue treatment with Linzess  (dose adjusted), Colace/MiraLAX  and as needed Dulcolax suppository. - Despite bowel regimen still having difficulty moving her bowels and feeling uncomfortable - Smog enema has been ordered.   Subjective:  Continues to express significant pain  requiring IV analgesics; patient also complaining of constipation.  No chest pain, no shortness  of breath and good saturation on chronic supplementation.  Physical Exam: Vitals:   01/05/24 0804 01/05/24 0808 01/05/24 1117 01/05/24 1405  BP:   131/61 (!) 129/58  Pulse:   84 86  Resp:    15  Temp:    97.9 F (36.6 C)  TempSrc:    Oral  SpO2: 97% 100%  100%  Weight:       General exam: Alert, awake, oriented x 3; complaining of significant pain in her back and constipation. Respiratory system: No using accessory muscles; good saturation on chronic supplementation (5 L nasal cannula)  Cardiovascular system:RRR. No rubs or gallops; unable to assess JVD. Gastrointestinal system: Abdomen is obese, nondistended, soft and nontender. No organomegaly or masses felt. Normal bowel sounds heard. Central nervous system: Moving 4 limbs spontaneously.  No focal neurological deficits. Extremities: No cyanosis or clubbing. Skin: No petechiae. Psychiatry: Judgement and insight appear normal.  Affect appreciated on exam.  Latest data Reviewed: Basic metabolic panel: Sodium 141, potassium 4.0, chloride 95, bicarb 35, BUN 11, creatinine 0.74 and GFR >60  CBC: WBCs 8.4, hemoglobin 8.7 and platelet count 375K  Family Communication: Husband at bedside.  Disposition: Status is: Inpatient Remains inpatient appropriate because: Continue IV analgesics.  Will follow physical therapy evaluation/recommendation; patient with previous similar admission requiring short-term rehabilitation at a nursing home.  Time spent: 50 minutes  Author: Justina Oman, MD 01/05/2024 3:35 PM  For on call review [email or web address].

## 2024-01-05 NOTE — TOC Initial Note (Signed)
 Transition of Care [facility]) - Initial/Assessment Note    Patient Details  Name: Kathleen [name] MRN: [id number] Date of Birth: June 18, 1961  Transition of [facility]) CM/SW Contact:    Lynda Sands, RN Phone Number: 01/05/2024, 9:23 AM  Clinical Narrative:      CM met with patient at bedside. Patient reports she was at rehab, was discharged and fell at home. Patient  was admitted from home.  Patient lives with her  spouse in a single family home. Patient needs assistance with adl's. Patient 's spouse provides assistance  with adl's. Patient reports she does not ambulate. Patient is on continuous oxygen  5l/[place] . [employer] [facility] provides oxygen . Patient spouse provides transportation.   Expected Discharge Plan: Home/Self Care  Patient spouse provides assistance at home.   Patient Goals and CMS Choice    Home with family assistance Expected Discharge Plan and Services   Patient plans on returning home.   Living arrangements for the past 2 months: Single Family Home                   Prior Living Arrangements/Services Living arrangements for the past 2 months: Single Family Home Lives with:: Spouse Patient language and need for interpreter reviewed:: No          Care giver support system in place?: Yes (comment)      Activities of Daily Living   ADL Screening (condition at time of admission) Independently performs ADLs?: No Does the patient have a NEW difficulty with bathing/dressing/toileting/self-feeding that is expected to last >3 days?: Yes (Initiates electronic notice to provider for possible OT consult) Does the patient have a NEW difficulty with getting in/out of bed, walking, or climbing stairs that is expected to last >3 days?: Yes (Initiates electronic notice to provider for possible PT consult) Does the patient have a NEW difficulty with communication that is expected to last >3 days?: No Is the patient deaf or have difficulty hearing?: No Does the patient have difficulty  seeing, even when wearing glasses/contacts?: No Does the patient have difficulty concentrating, remembering, or making decisions?: No  Permission Sought/Granted      Share Information with NAME: Kathleen [name]     Permission granted to share info w Relationship: Spouse  Permission granted to share info w Contact Information: [phone]  Emotional Assessment Appearance:: Appears stated age Attitude/Demeanor/Rapport: Engaged Affect (typically observed): Stable Orientation: : Oriented to Self, Oriented to Place, Oriented to  Time, Oriented to Situation      Admission diagnosis:  Intractable low back pain [M54.59] Closed compression fracture of L3 lumbar vertebra, initial encounter (HCC) [S32.030A] Patient Active Problem List   Diagnosis Date Noted   Intractable low back pain 01/03/2024   Ambulatory dysfunction 12/11/2023   Uncontrolled pain 12/08/2023   Pressure injury of skin 12/08/2023   Intractable back pain 12/07/2023   Class 1 obesity due to excess calories with serious comorbidity and body mass index (BMI) of 34.0 to 34.9 in adult 10/28/2023   Prediabetes 10/28/2023   Ankle fracture, bimalleolar, closed, left, sequela 03/10/2023   Closed fracture of left ankle 03/10/2023   Syndesmotic disruption of ankle, left, initial encounter 03/10/2023   Lumbar transverse process fracture (HCC) 01/02/2023   Acute on chronic hypoxic respiratory failure (HCC) 12/13/2022   Depression, major, single episode, moderate (HCC) 09/10/2022   Aortic atherosclerosis (HCC) 09/10/2022   Chronic respiratory failure with hypoxia (HCC) 05/22/2021   Anxiety and depression 05/22/2021   Vitamin D  deficiency 05/09/2021   Left  foot drop 01/09/2021   Pulmonary HTN (HCC) 10/12/2020   Encounter for long-term opiate analgesic use 02/02/2020   Interstitial lung disease (HCC) 04/14/2019   GERD (gastroesophageal reflux disease) 10/15/2017   Personal history of noncompliance with medical treatment, presenting  hazards to health 04/26/2017   Hypothyroidism 01/15/2017   Adrenal insufficiency (Addison's disease) (HCC) 08/22/2016   Symptomatic bradycardia    Adrenal insufficiency (HCC) 08/17/2016   Hypokalemia 08/17/2016   Chronic pain syndrome 04/20/2016   Irritable bowel syndrome with constipation 11/14/2015   Avascular necrosis of bone of hip, left (HCC) 04/05/2015   Post herpetic neuralgia 10/22/2014   Mixed hyperlipidemia 08/25/2014   Obstructive sleep apnea 02/18/2014   Class 2 severe obesity due to excess calories with serious comorbidity and body mass index (BMI) of 38.0 to 38.9 in adult [facility]) 05/06/2013   Fibromyalgia 02/11/2013   Bilateral lower extremity edema 01/22/2013   Hypotension 01/16/2013   Fibrocystic breast disease in female 04/09/2012   Sarcoidosis 06/21/2008   PCP:  Bennet Brasil, MD Pharmacy:   [facility] [phone] - [place], [place] - [address] AT [facility] [address] & [address] [place] [address] Phone: [phone] Fax: [phone]  [place] - [facility] [address] [place] [address] Phone: [phone] Fax: [phone]  [place] - [facility] [address], [address] [place] [address] Phone: [phone] Fax: [phone]     Social Drivers of Health (SDOH) Social History: SDOH Screenings   Food Insecurity: No Food Insecurity (01/03/2024)  Housing: Low Risk  (01/03/2024)  Transportation Needs: No Transportation Needs (01/03/2024)  Utilities: Not At Risk (01/03/2024)  Alcohol Screen: Low Risk  (09/27/2023)  Depression (PHQ2-9): Low Risk  (01/01/2024)  Financial Resource Strain: Low Risk  (09/27/2023)  Physical Activity: Inactive (09/27/2023)  Social Connections: Socially Integrated (01/03/2024)  Stress: No Stress Concern Present (09/27/2023)  Tobacco Use: Low Risk  (01/03/2024)  Health Literacy: Medium Risk (12/20/2023)   Received from [facility]   SDOH  Interventions:     Readmission Risk Interventions    12/11/2023   11:11 AM 04/08/2023   10:38 AM  Readmission Risk Prevention Plan  Transportation Screening Complete Complete  HRI or Home Care Consult Complete   Social Work Consult for Recovery Care Planning/Counseling Complete   Palliative Care Screening Not Applicable   Medication Review [employer]) Complete Complete  HRI or Home Care Consult  Complete  SW Recovery Care/Counseling Consult  Complete  Palliative Care Screening  Not Applicable  Skilled Nursing Facility  Not Applicable

## 2024-01-05 NOTE — Progress Notes (Signed)
   01/05/24 0906  TOC Assessment  TOC screening is complete Yes  Once discharged, how will the patient get to their discharge location? Ambulance  Expected Discharge Plan Home/Self Care  Living arrangements for the past 2 months Single Family Home  Lives with: Spouse  Share Information with NAME Kathleen [name]  Permission granted to share info w Relationship Spouse  Permission granted to share info w Contact Information [phone]  Patient language and need for interpreter reviewed: No  Care giver support system in place? Y  Appearance: Appears stated age  Attitude/Demeanor/Rapport Engaged  Affect (typically observed) Stable  Orientation:  Oriented to Self;Oriented to Place;Oriented to  Time;Oriented to Situation   Admitted with Intractable low back pain. Transition of Care Department [facility]) has reviewed patient . TOC will continue to monitor patient advancement through interdisciplinary progression rounds.

## 2024-01-05 NOTE — Plan of Care (Signed)
   Problem: Health Behavior/Discharge Planning: Goal: Ability to manage health-related needs will improve Outcome: Progressing   Problem: Clinical Measurements: Goal: Diagnostic test results will improve Outcome: Progressing

## 2024-01-05 NOTE — Progress Notes (Signed)
 Removed foley catheter per order, purwick placed.

## 2024-01-05 NOTE — Plan of Care (Signed)

## 2024-01-06 ENCOUNTER — Other Ambulatory Visit (HOSPITAL_COMMUNITY): Payer: Self-pay

## 2024-01-06 ENCOUNTER — Ambulatory Visit (HOSPITAL_COMMUNITY)

## 2024-01-06 DIAGNOSIS — M5459 Other low back pain: Secondary | ICD-10-CM | POA: Diagnosis not present

## 2024-01-06 DIAGNOSIS — S32030A Wedge compression fracture of third lumbar vertebra, initial encounter for closed fracture: Secondary | ICD-10-CM | POA: Diagnosis not present

## 2024-01-06 DIAGNOSIS — E039 Hypothyroidism, unspecified: Secondary | ICD-10-CM | POA: Diagnosis not present

## 2024-01-06 DIAGNOSIS — E271 Primary adrenocortical insufficiency: Secondary | ICD-10-CM | POA: Diagnosis not present

## 2024-01-06 LAB — BASIC METABOLIC PANEL WITH GFR
Anion gap: 11 (ref 5–15)
BUN: 10 mg/dL (ref 8–23)
CO2: 35 mmol/L — ABNORMAL HIGH (ref 22–32)
Calcium: 9.2 mg/dL (ref 8.9–10.3)
Chloride: 95 mmol/L — ABNORMAL LOW (ref 98–111)
Creatinine, Ser: 0.83 mg/dL (ref 0.44–1.00)
GFR, Estimated: >60 mL/min (ref >60)
Glucose, Bld: 124 mg/dL — ABNORMAL HIGH (ref 70–99)
Potassium: 3.7 mmol/L (ref 3.5–5.1)
Sodium: 141 mmol/L (ref 135–145)

## 2024-01-06 LAB — CBC
HCT: 31 % — ABNORMAL LOW (ref 36.0–46.0)
Hemoglobin: 9.2 g/dL — ABNORMAL LOW (ref 12.0–15.0)
MCH: 29.1 pg (ref 26.0–34.0)
MCHC: 29.7 g/dL — ABNORMAL LOW (ref 30.0–36.0)
MCV: 98.1 fL (ref 80.0–100.0)
Platelets: 396 10*3/uL (ref 150–400)
RBC: 3.16 MIL/uL — ABNORMAL LOW (ref 3.87–5.11)
RDW: 15.4 % (ref 11.5–15.5)
WBC: 7 10*3/uL (ref 4.0–10.5)
nRBC: 0.6 % — ABNORMAL HIGH (ref 0.0–0.2)

## 2024-01-06 LAB — MAGNESIUM: Magnesium: 2.1 mg/dL (ref 1.7–2.4)

## 2024-01-06 MED ORDER — FUROSEMIDE 10 MG/ML IJ SOLN
20.0000 mg | Freq: Once | INTRAMUSCULAR | Status: AC
  Administered 2024-01-06: 20 mg via INTRAVENOUS
  Filled 2024-01-06: qty 2

## 2024-01-06 NOTE — Plan of Care (Signed)

## 2024-01-06 NOTE — Progress Notes (Signed)
 Progress Note   Patient: Kathleen [name] ZOX:[id number] DOB: 07/30/1961 DOA: 01/03/2024     3 DOS: the patient was seen and examined on 01/06/2024   Brief hospital admission narrative: As per H&P written by Dr. Osborne Blazer on 01/03/2024  Kathleen [name]  is a 63 y.o. female,  with medical history significant for chronic respiratory failure on 5 L, interstitial lung disease, sarcoidosis, Addison's disease, chronic pain syndrome, anxiety and depression, hypothyroidism.  Patient with recent admission secondary to L4 compression fracture and intractable back pain, she was discharged to SNF, was just discharged from SNF 3 days ago, and did follow-up with her orthopedic/neurosurgery recently, where she will need medical clearance given her multiple comorbidities for considering any neurosurgery . -Patient presents to ED secondary to worsening back pain, patient reports she got on the commode, twisted her back, then she developed severe lower back pain, reports similar to previous back pain, she also reports some urinary retention, which she had previously during recent admission with her severe back pain, she denies any urinary or stool incontinence, she also reports constipation which is chronic, no fever, no chills, no abdominal pain, no lower extremity weakness, no saddle paresthesia,. - NAD MRI of lumbar spine showing evidence of Acute compression fracture of superior endplate of L3, Acute to subacute compression fracture of superior endplate of L4, she required multiple doses of IV pain medication for pain control, she was noted to have urinary retention where she had 600 cc output with In-N-Out, [facility] hospitalist consulted to admit  Assessment and plan 1-intractable lower back pain -Secondary to acute compression fracture of superior endplate of L3; patient also with acute to subacute compression fracture of superior endplate of L4 - There has not been any mechanical falls, trauma or direct injury - Patient at  high risk for fractures in the setting of chronic steroid usage and osteopenia/osteoporosis - Continue as needed analgesics, muscle relaxant, the use of her LSO brace once sitting up and up with activity for support - Continue patient follow-up with orthopedic service and pain clinic. - Continue calcium /vitamin D  supplementation and will benefit of discussing treatment with IV biphosphonate's as an outpatient.  2-acute urinary retention - Patient has failed voiding trial and Foley catheter has been replaced - Continue treatment with Flomax  and follow response. - Patient advised to maintain adequate hydration.  3-adrenal insufficiency - Continue treatment with Solu-Cortef , daily prednisone  and midodrine .  4-pulmonary fibrosis/sarcoidosis - Continue treatment with prednisone  and as needed bronchodilator management - Good saturation on chronic supplementation (5 L nasal cannula)..  5-anxiety and depression - Continue treatment with Cymbalta , hydroxyzine  and BuSpar .  6-acute respiratory failure with pulm disease/obstructive sleep apnea Will benefit of a sleep study and the use of CPAP/BiPAP nightly - Continue on oxygen  supplementation (5 L at baseline) - No acute distress or exacerbation currently appreciated.  7-class III obesity -Body mass index is 42.93 kg/m. -Outpatient follow-up with endocrinology/obesity management - Patient instructed to be started on Wegovy  or Mounjaro - Low-calorie diet, portion control and increase physical activity discussed with patient.  8-history of chronic hypertension - Continue management with midodrine .  9-constipation - In the setting of decreased physical activity and chronic use of narcotics - Continue treatment with Linzess  (dose adjusted), Colace/MiraLAX  and as needed Dulcolax suppository. - Despite bowel regimen still having difficulty moving her bowels and feeling uncomfortable - Smog enema has been ordered.   Subjective:  Continue  complaining of significant back pain requiring IV analgesics.  Patient also failing voiding trial  and ended requiring replacement of Foley catheter..  No fever, no chest pain, no nausea, no vomiting.  Patient expressed no chest pain and demonstrate good saturation on chronic supplementation.  Physical Exam: Vitals:   01/05/24 2028 01/06/24 0359 01/06/24 0714 01/06/24 1406  BP:  (!) 126/54  (!) 118/53  Pulse:  94  90  Resp:  20  20  Temp:  97.7 F (36.5 C)  98 F (36.7 C)  TempSrc:  Oral  Oral  SpO2: 95% 98% 99% 100%  Weight:      Height:       General exam: Alert, awake, oriented x 3; continue complaining of significant pain in her back. Respiratory system: Good patient on chronic supplementation (5 L nasal cannula); no using accessory muscles.  No wheezing or crackles appreciated on exam. Cardiovascular system: Rate controlled, no rubs, no gallops, unable to assess JVD with body habitus. Gastrointestinal system: Abdomen is obese, nondistended, soft and nontender. No organomegaly or masses felt. Normal bowel sounds heard. Central nervous system: Moving 4 limbs spontaneously.  No focal neurological deficits. Extremities: No trace to 1+ edema appreciated bilaterally.  No cyanosis or clubbing. Skin: No petechiae. Psychiatry: Judgement and insight appear normal.  Flat affect appreciated on exam.  Latest data Reviewed: CBC: WBC 7.0, hemoglobin 9.2 and platelet count 396K Basic metabolic panel: Sodium 141, potassium 3.7, bicarb 35, chloride 95, BUN 10, creatinine 0.83 and GFR >60  Family Communication: Husband at bedside.  Disposition: Status is: Inpatient Remains inpatient appropriate because: Continue IV analgesics.  Will follow physical therapy evaluation/recommendation; patient with previous similar admission requiring short-term rehabilitation at a nursing home.  Time spent: 50 minutes  Author: Justina Oman, MD 01/06/2024 3:37 PM  For on call review [email or web address].

## 2024-01-06 NOTE — Progress Notes (Signed)
   01/06/24 1020  Spiritual Encounters  Type of Visit Initial  Care provided to: Pt not available  Referral source Other (comment) (Spitirual Consult)  Reason for visit Advance directives  OnCall Visit No   Chaplain responded to a spiritual consult for advanced directive education.  The patient was receiving care at the time of my visits.   Clarence Croak [facility]  [phone]

## 2024-01-06 NOTE — Progress Notes (Signed)
 Pt called due to pain on left side and then complained several times of pain from her foley, pts husband got patient out of bed and to bedside without assistance and the foley was pulled on, she complained the foley was burning and hurting, I assessed the foley and pt kept complaining requesting for foley to be taking out and new placed, I reminded her why she had the foley due to retention and she still complained then asked if she could just have in and outs done like she does at home. I explained to the patient after so many in and outs she would receive another foley because she requested to be in and out every two-3 hours and we cannot provide that frequent of in and outs caths. Foley removed and pure wic placed, will bladder scan pt and if patient is still retaining will message doc for in and out order or foley.

## 2024-01-06 NOTE — Plan of Care (Signed)
  Problem: Education: Goal: Knowledge of General Education information will improve Description: Including pain rating scale, medication(s)/side effects and non-pharmacologic comfort measures Outcome: Progressing   Problem: Health Behavior/Discharge Planning: Goal: Ability to manage health-related needs will improve Outcome: Progressing   Problem: Clinical Measurements: Goal: Ability to maintain clinical measurements within normal limits will improve Outcome: Progressing   Problem: Coping: Goal: Level of anxiety will decrease Outcome: Progressing   Problem: Elimination: Goal: Will not experience complications related to bowel motility Outcome: Progressing   Problem: Pain Managment: Goal: General experience of comfort will improve and/or be controlled Outcome: Progressing   Problem: Safety: Goal: Ability to remain free from injury will improve Outcome: Progressing   Problem: Skin Integrity: Goal: Risk for impaired skin integrity will decrease Outcome: Progressing

## 2024-01-06 NOTE — Progress Notes (Signed)
 The beneficiary is confined to a single room, due to this a bariatric bedside commode at discharge.

## 2024-01-06 NOTE — Plan of Care (Signed)
  Problem: Acute Rehab PT Goals(only PT should resolve) Goal: Pt Will Go Supine/Side To Sit Outcome: Progressing Flowsheets (Taken 01/06/2024 1405) Pt will go Supine/Side to Sit:  with contact guard assist  with minimal assist Goal: Patient Will Transfer Sit To/From Stand Outcome: Progressing Flowsheets (Taken 01/06/2024 1405) Patient will transfer sit to/from stand:  with contact guard assist  with minimal assist Goal: Pt Will Transfer Bed To Chair/Chair To Bed Outcome: Progressing Flowsheets (Taken 01/06/2024 1405) Pt will Transfer Bed to Chair/Chair to Bed:  with contact guard assist  with min assist Goal: Pt Will Ambulate Outcome: Progressing Flowsheets (Taken 01/06/2024 1405) Pt will Ambulate:  25 feet  with minimal assist  with rolling walker   2:06 PM, 01/06/24 Walton Guppy, MPT Physical Therapist with [facility] [phone] office [phone] mobile phone

## 2024-01-06 NOTE — TOC Progression Note (Signed)
 Transition of Care [facility]) - Progression Note    Patient Details  Name: Kathleen [name] MRN: [id number] Date of Birth: 1961/07/01  Transition of Care [facility]) CM/SW Contact  Grandville Lax, [place] Phone Number: 01/06/2024, 3:38 PM  Clinical Narrative:    CSW updated that PT is recommending HH PT for pt at D/C. CSW met with pt at bedside to follow up. Pt states that she has HH coming out now and services are through [facility]. CSW reached out to [facility] rep Randel Buss to confirm services and needs for orders. Pt states she needs a bariatric bedside commode. CSW sent referral to Zach with [employer] who will work on and get delivered to pts home. TOC to follow.   Expected Discharge Plan: Home/Self Care    Expected Discharge Plan and Services       Living arrangements for the past 2 months: Single Family Home                                       Social Determinants of Health (SDOH) Interventions SDOH Screenings   Food Insecurity: No Food Insecurity (01/03/2024)  Housing: Low Risk  (01/03/2024)  Transportation Needs: No Transportation Needs (01/03/2024)  Utilities: Not At Risk (01/03/2024)  Alcohol Screen: Low Risk  (09/27/2023)  Depression (PHQ2-9): Low Risk  (01/01/2024)  Financial Resource Strain: Low Risk  (09/27/2023)  Physical Activity: Inactive (09/27/2023)  Social Connections: Socially Integrated (01/03/2024)  Stress: No Stress Concern Present (09/27/2023)  Tobacco Use: Low Risk  (01/03/2024)  Health Literacy: Medium Risk (12/20/2023)   Received from [facility]    Readmission Risk Interventions    12/11/2023   11:11 AM 04/08/2023   10:38 AM  Readmission Risk Prevention Plan  Transportation Screening Complete Complete  HRI or Home Care Consult Complete   Social Work Consult for Recovery Care Planning/Counseling Complete   Palliative Care Screening Not Applicable   Medication Review [employer]) Complete Complete  HRI or Home Care Consult  Complete  SW Recovery Care/Counseling Consult   Complete  Palliative Care Screening  Not Applicable  Skilled Nursing Facility  Not Applicable

## 2024-01-06 NOTE — Evaluation (Signed)
 Physical Therapy Evaluation Patient Details Name: Kathleen [name] MRN: [id number] DOB: 1960-09-12 Today's Date: 01/06/2024  History of Present Illness  Kathleen [name]  is a 63 y.o. female,  with medical history significant for chronic respiratory failure on 5 L, interstitial lung disease, sarcoidosis, Addison's disease, chronic pain syndrome, anxiety and depression, hypothyroidism.  Patient with recent admission secondary to L4 compression fracture and intractable back pain, she was discharged to SNF, was just discharged from SNF 3 days ago, and did follow-up with her orthopedic/neurosurgery recently, where she will need medical clearance given her multiple comorbidities for considering any neurosurgery .  -Patient presents to ED secondary to worsening back pain, patient reports she got on the commode, twisted her back, then she developed severe lower back pain, reports similar to previous back pain, she also reports some urinary retention, which she had previously during recent admission with her severe back pain, she denies any urinary or stool incontinence, she also reports constipation which is chronic, no fever, no chills, no abdominal pain, no lower extremity weakness, no saddle paresthesia,.  - NAD MRI of lumbar spine showing evidence of Acute compression fracture of superior endplate of L3, Acute to subacute compression fracture of superior endplate of L4, she required multiple doses of IV pain medication for pain control, she was noted to have urinary retention where she had 600 cc output with In-N-Out, [facility] hospitalist consulted to admit   Clinical Impression  Patient demonstrates fair return for rolling to side and sitting up from side lying position with Mod assist, required assistance for donning back support brace and limited to a few side steps at bedside before requesting to sit due to back pain. Patient tolerated sitting up in chair after therapy. Patient will benefit from continued skilled  physical therapy in hospital and recommended venue below to increase strength, balance, endurance for safe ADLs and gait.          If plan is discharge home, recommend the following: A lot of help with walking and/or transfers;Help with stairs or ramp for entrance;Assistance with cooking/housework;A lot of help with bathing/dressing/bathroom   Can travel by private vehicle        Equipment Recommendations None recommended by PT  Recommendations for Other Services       Functional Status Assessment Patient has had a recent decline in their functional status and demonstrates the ability to make significant improvements in function in a reasonable and predictable amount of time.     Precautions / Restrictions Precautions Precautions: Fall Recall of Precautions/Restrictions: Intact Restrictions Weight Bearing Restrictions Per Provider Order: No      Mobility  Bed Mobility Overal bed mobility: Needs Assistance Bed Mobility: Rolling, Sidelying to Sit Rolling: Min assist Sidelying to sit: Min assist, Mod assist       General bed mobility comments: increased time, labord movement    Transfers Overall transfer level: Needs assistance Equipment used: Rolling walker (2 wheels) Transfers: Sit to/from Stand, Bed to chair/wheelchair/BSC Sit to Stand: Min assist   Step pivot transfers: Min assist       General transfer comment: unsteady labored movement with c/o increasing low back pain    Ambulation/Gait Ambulation/Gait assistance: Min assist, Mod assist Gait Distance (Feet): 5 Feet Assistive device: Rolling walker (2 wheels) Gait Pattern/deviations: Decreased step length - left, Decreased stance time - right, Decreased stride length Gait velocity: slow     General Gait Details: limited to a few steps at bedside mostly due to increasing low back pain  Stairs            Wheelchair Mobility     Tilt Bed    Modified Rankin (Stroke Patients Only)        Balance Overall balance assessment: Needs assistance Sitting-balance support: Feet supported, No upper extremity supported Sitting balance-Leahy Scale: Fair Sitting balance - Comments: fair/good seated at EOB   Standing balance support: Reliant on assistive device for balance, During functional activity, Bilateral upper extremity supported Standing balance-Leahy Scale: Fair Standing balance comment: using RW                             Pertinent Vitals/Pain Pain Assessment Pain Assessment: Faces Faces Pain Scale: Hurts even more Pain Location: low back with movement Pain Descriptors / Indicators: Sharp, Discomfort, Grimacing Pain Intervention(s): Limited activity within patient's tolerance, Monitored during session, Repositioned    Home Living Family/patient expects to be discharged to:: Private residence Living Arrangements: Spouse/significant other Available Help at Discharge: Family;Available PRN/intermittently Type of Home: House Home Access: Stairs to enter Entrance Stairs-Rails: None Entrance Stairs-Number of Steps: 1   Home Layout: One level Home Equipment: Agricultural consultant (2 wheels);Electric scooter;Shower seat;Hand held shower head      Prior Function Prior Level of Function : Needs assist       Physical Assist : Mobility (physical);ADLs (physical) Mobility (physical): Bed mobility;Transfers;Gait;Stairs   Mobility Comments: short household distances using RW, scooter ADLs Comments: Husband assist with most ADL's pt able to feed self and comb hair.     Extremity/Trunk Assessment   Upper Extremity Assessment Upper Extremity Assessment: Defer to OT evaluation    Lower Extremity Assessment Lower Extremity Assessment: Generalized weakness    Cervical / Trunk Assessment Cervical / Trunk Assessment: Normal  Communication   Communication Communication: No apparent difficulties    Cognition Arousal: Alert Behavior During Therapy: WFL for  tasks assessed/performed   PT - Cognitive impairments: No apparent impairments                         Following commands: Intact       Cueing Cueing Techniques: Verbal cues, Tactile cues     General Comments      Exercises     Assessment/Plan    PT Assessment Patient needs continued PT services  PT Problem List Decreased strength;Decreased activity tolerance;Decreased balance;Decreased mobility       PT Treatment Interventions DME instruction;Gait training;Stair training;Functional mobility training;Therapeutic activities;Therapeutic exercise;Balance training;Patient/family education    PT Goals (Current goals can be found in the Care Plan section)  Acute Rehab PT Goals Patient Stated Goal: return home with family to assist PT Goal Formulation: With patient Time For Goal Achievement: 01/13/24 Potential to Achieve Goals: Good    Frequency Min 3X/week     Co-evaluation               AM-PAC PT "6 Clicks" Mobility  Outcome Measure Help needed turning from your back to your side while in a flat bed without using bedrails?: A Little Help needed moving from lying on your back to sitting on the side of a flat bed without using bedrails?: A Lot Help needed moving to and from a bed to a chair (including a wheelchair)?: A Little Help needed standing up from a chair using your arms (e.g., wheelchair or bedside chair)?: A Lot Help needed to walk in hospital room?: A Little Help needed climbing 3-5 steps with  a railing? : A Lot 6 Click Score: 15    End of Session   Activity Tolerance: Patient tolerated treatment well;Patient limited by fatigue Patient left: in chair;with call bell/phone within reach Nurse Communication: Mobility status PT Visit Diagnosis: Unsteadiness on feet (R26.81);Other abnormalities of gait and mobility (R26.89);Muscle weakness (generalized) (M62.81)    Time: 1610-9604 PT Time Calculation (min) (ACUTE ONLY): 33 min   Charges:   PT  Evaluation $PT Eval Moderate Complexity: 1 Mod PT Treatments $Therapeutic Activity: 23-37 mins PT General Charges $$ ACUTE PT VISIT: 1 Visit         2:04 PM, 01/06/24 Walton Guppy, MPT Physical Therapist with [facility] [phone] office [phone] mobile phone

## 2024-01-07 DIAGNOSIS — E271 Primary adrenocortical insufficiency: Secondary | ICD-10-CM | POA: Diagnosis not present

## 2024-01-07 DIAGNOSIS — S32030A Wedge compression fracture of third lumbar vertebra, initial encounter for closed fracture: Secondary | ICD-10-CM | POA: Diagnosis not present

## 2024-01-07 DIAGNOSIS — E039 Hypothyroidism, unspecified: Secondary | ICD-10-CM | POA: Diagnosis not present

## 2024-01-07 DIAGNOSIS — M5459 Other low back pain: Secondary | ICD-10-CM | POA: Diagnosis not present

## 2024-01-07 LAB — BASIC METABOLIC PANEL WITH GFR
Anion gap: 9 (ref 5–15)
BUN: 12 mg/dL (ref 8–23)
CO2: 36 mmol/L — ABNORMAL HIGH (ref 22–32)
Calcium: 9.2 mg/dL (ref 8.9–10.3)
Chloride: 96 mmol/L — ABNORMAL LOW (ref 98–111)
Creatinine, Ser: 0.93 mg/dL (ref 0.44–1.00)
GFR, Estimated: >60 mL/min (ref >60)
Glucose, Bld: 121 mg/dL — ABNORMAL HIGH (ref 70–99)
Potassium: 3.7 mmol/L (ref 3.5–5.1)
Sodium: 141 mmol/L (ref 135–145)

## 2024-01-07 MED ORDER — OXYCODONE HCL 5 MG PO TABS: 15.0000 mg | ORAL_TABLET | Freq: Four times a day (QID) | ORAL | Status: DC | PRN

## 2024-01-07 MED ORDER — ACETAMINOPHEN 500 MG PO TABS
1000.0000 mg | ORAL_TABLET | Freq: Three times a day (TID) | ORAL | Status: DC
  Administered 2024-01-07 – 2024-01-08 (×3): 1000 mg via ORAL
  Filled 2024-01-07 (×3): qty 2

## 2024-01-07 MED ORDER — OXYCODONE HCL 5 MG PO TABS
15.0000 mg | ORAL_TABLET | ORAL | Status: DC | PRN
  Administered 2024-01-07 – 2024-01-08 (×6): 15 mg via ORAL
  Filled 2024-01-07 (×6): qty 3

## 2024-01-07 NOTE — Progress Notes (Signed)
 [facility] Liaison Note  01/07/2024  Kathleen [name] 30-Mar-1961 [id number]  Location: RN Hospital Liaison screened the patient remotely at [facility].  Insurance: [employer]   Kathleen [name] is a 63 y.o. female who is a Primary Care Patient of Luking, Jackelyn Marvel, MD [facility]. The patient was screened for 7 day readmission hospitalization with noted high risk score for unplanned readmission risk with 2 IP/1 ED in 6 months.  The patient was assessed for potential Care Management service needs for post hospital transition for care coordination. Review of patient's electronic medical record reveals patient was admitted with Intractable low back pain. If pt transitioned to a SNF the facility, this facility will continue to address his ongoing needs along with STR.   Plan: [facility] Liaison will continue to follow progress and disposition to asess for post hospital community care coordination/management needs.  Referral request for community care coordination: pending disposition.   VBCI Care Management/Population Health does not replace or interfere with any arrangements made by the Inpatient Transition of Care team.   For questions contact:   Lilla Reichert, RN, BSN Hospital Liaison [place]   [facility], Population Health Office Hours MTWF  8:00 am-6:00 pm Direct Dial: [phone] mobile [email or web address]

## 2024-01-07 NOTE — Progress Notes (Signed)
 Bladder scanned patient, 45 mls present in bladder, repositioned patient, also patient stopped drinking fluids, pt stated "Im afraid to drink because it will make me pee". Informed patient she should not be afraid to pee just because she wanted the foley out, and that she needs to continue to drink fluids or she could get dehydrated. Pt been extremely anxious regarding her peeing, and was awake throughout the whole night with multiple request regarding discharge and medications.Aaron Aas

## 2024-01-07 NOTE — Progress Notes (Signed)
 Progress Note   Patient: Kathleen [name] KGM:[id number] DOB: [birth year] DOA: 01/03/2024     4 DOS: the patient was seen and examined on 01/07/2024   Brief hospital admission narrative: As per H&P written by Dr. Osborne Blazer on 01/03/2024  Kathleen [name]  is a 63 y.o. female,  with medical history significant for chronic respiratory failure on 5 L, interstitial lung disease, sarcoidosis, Addison's disease, chronic pain syndrome, anxiety and depression, hypothyroidism.  Patient with recent admission secondary to L4 compression fracture and intractable back pain, she was discharged to SNF, was just discharged from SNF 3 days ago, and did follow-up with her orthopedic/neurosurgery recently, where she will need medical clearance given her multiple comorbidities for considering any neurosurgery . -Patient presents to ED secondary to worsening back pain, patient reports she got on the commode, twisted her back, then she developed severe lower back pain, reports similar to previous back pain, she also reports some urinary retention, which she had previously during recent admission with her severe back pain, she denies any urinary or stool incontinence, she also reports constipation which is chronic, no fever, no chills, no abdominal pain, no lower extremity weakness, no saddle paresthesia,. - NAD MRI of lumbar spine showing evidence of Acute compression fracture of superior endplate of L3, Acute to subacute compression fracture of superior endplate of L4, she required multiple doses of IV pain medication for pain control, she was noted to have urinary retention where she had 600 cc output with In-N-Out, [facility] hospitalist consulted to admit  Assessment and plan 1-intractable lower back pain -Secondary to acute compression fracture of superior endplate of L3; patient also with acute to subacute compression fracture of superior endplate of L4 - There has not been any mechanical falls, trauma or direct injury - Patient at  high risk for fractures in the setting of chronic steroid usage and osteopenia/osteoporosis - Continue as needed analgesics, muscle relaxant, the use of her LSO brace once sitting up and up with activity for support - Continue patient follow-up with orthopedic service and pain clinic. - Continue calcium /vitamin D  supplementation and will benefit of discussing treatment with IV biphosphonate's as an outpatient. - Hoping to control pain with oral management so patient can be discharged home. - Home health services has been ordered.  2-acute urinary retention - Patient has failed voiding trial and Foley catheter has been replaced (01/07/24) - Continue treatment with Flomax  and follow response. - Patient advised to maintain adequate hydration.  3-adrenal insufficiency - Continue treatment with Solu-Cortef , daily prednisone  and midodrine .  4-pulmonary fibrosis/sarcoidosis - Continue treatment with prednisone  and as needed bronchodilator management - Good saturation on chronic supplementation (5 L nasal cannula)..  5-anxiety and depression - Continue treatment with Cymbalta , hydroxyzine  and BuSpar .  6-acute respiratory failure with pulm disease/obstructive sleep apnea Will benefit of a sleep study and the use of CPAP/BiPAP nightly - Continue on oxygen  supplementation (5 L at baseline) - No acute distress or exacerbation currently appreciated.  7-class III obesity -Body mass index is 42.93 kg/m. -Outpatient follow-up with endocrinology/obesity management - Patient instructed to be started on Wegovy  or Mounjaro - Low-calorie diet, portion control and increase physical activity discussed with patient.  8-history of chronic hypertension - Continue management with midodrine .  9-constipation - In the setting of decreased physical activity and chronic use of narcotics - Continue treatment with Linzess  (dose adjusted), Colace/MiraLAX  and as needed Dulcolax suppository. - Despite bowel  regimen still having difficulty moving her bowels and feeling uncomfortable - Smog enema  has been ordered; patient successfully Had bowel movements for 2 days. - Continue bowel regimen and adequate hydration.   Subjective:  Patient has failed voiding trial and required replacement of Foley catheter.  Still complaining of significant back pain.  No nausea, no vomiting, no chest pain.  Physical Exam: Vitals:   01/06/24 2125 01/07/24 0521 01/07/24 0724 01/07/24 1519  BP: (!) 124/45 (!) 119/51  (!) 112/50  Pulse: (!) 103 94  100  Resp: 20 20  20   Temp: 98.1 F (36.7 C) 98.3 F (36.8 C)  98.5 F (36.9 C)  TempSrc: Oral Oral  Oral  SpO2: 96% 98% 98% 98%  Weight:      Height:       General exam: Alert, awake, oriented x 3; no chest pain, no nausea, no vomiting; complaining of back pain and inability to pee by herself. Respiratory system: Clear to auscultation. Respiratory effort normal.  Good saturation on 5 L supplementation. Cardiovascular system:RRR. No rubs or gallops. Gastrointestinal system: Abdomen is obese nondistended, soft and nontender. No organomegaly or masses felt. Normal bowel sounds heard. Central nervous system: Moving 4 limbs spontaneously; no focal neurological deficits. Extremities: No cyanosis or clubbing.  Trace edema appreciated bilaterally. Skin: No petechiae. Psychiatry: Judgement and insight appear normal. Mood & affect appropriate.   Latest data Reviewed: CBC: WBC 7.0, hemoglobin 9.2 and platelet count 396K Basic metabolic panel: Sodium 141, potassium 3.7, bicarb 35, chloride 95, BUN 10, creatinine 0.83 and GFR >60  Family Communication: Husband at bedside.  Disposition: Status is: Inpatient Remains inpatient appropriate because: Continue IV analgesics.  Will follow physical therapy evaluation/recommendation; patient with previous similar admission requiring short-term rehabilitation at a nursing home.  Time spent: 50 minutes  Author: Justina Oman,  MD 01/07/2024 5:53 PM  For on call review [email or web address].

## 2024-01-07 NOTE — Plan of Care (Signed)

## 2024-01-07 NOTE — Progress Notes (Signed)
 Physical Therapy Treatment Patient Details Name: CHANA WARBRITTON MRN: [id number] DOB: 05-Aug-1961 Today's Date: 01/07/2024   History of Present Illness Anetria Albaladejo  is a 63 y.o. female,  with medical history significant for chronic respiratory failure on 5 L, interstitial lung disease, sarcoidosis, Addison's disease, chronic pain syndrome, anxiety and depression, hypothyroidism.  Patient with recent admission secondary to L4 compression fracture and intractable back pain, she was discharged to SNF, was just discharged from SNF 3 days ago, and did follow-up with her orthopedic/neurosurgery recently, where she will need medical clearance given her multiple comorbidities for considering any neurosurgery .  -Patient presents to ED secondary to worsening back pain, patient reports she got on the commode, twisted her back, then she developed severe lower back pain, reports similar to previous back pain, she also reports some urinary retention, which she had previously during recent admission with her severe back pain, she denies any urinary or stool incontinence, she also reports constipation which is chronic, no fever, no chills, no abdominal pain, no lower extremity weakness, no saddle paresthesia,.  - NAD MRI of lumbar spine showing evidence of Acute compression fracture of superior endplate of L3, Acute to subacute compression fracture of superior endplate of L4, she required multiple doses of IV pain medication for pain control, she was noted to have urinary retention where she had 600 cc output with In-N-Out, [facility] hospitalist consulted to admit    PT Comments  Patient continues to demonstrate need for min/mod assist for bed mobility, functional transfers and ambulation.  Pt also continues to demonstrate decreased LE strength, decreased gait quality and balance. Patient also demonstrates decreased endurance with aerobic based exercise during today's session due to pain and fear of falling. Patient able to  progress ambulation distance today with RW, good performance with verbal cueing gait belt and min assist. Patient would continue to benefit from skilled physical therapy for increased endurance with ambulation, increased LE strength, and improved balance for improved quality of life, improved independence with gait training and continued progress towards therapy goals.     If plan is discharge home, recommend the following: A lot of help with walking and/or transfers;Help with stairs or ramp for entrance;Assistance with cooking/housework;A lot of help with bathing/dressing/bathroom   Can travel by private vehicle        Equipment Recommendations  None recommended by PT    Recommendations for Other Services       Precautions / Restrictions Precautions Precautions: Fall Recall of Precautions/Restrictions: Intact Precaution/Restrictions Comments: LSO brace Restrictions Weight Bearing Restrictions Per Provider Order: No     Mobility  Bed Mobility Overal bed mobility: Needs Assistance Bed Mobility: Rolling, Sidelying to Sit Rolling: Supervision Sidelying to sit: Min assist       General bed mobility comments: increased time, labord movement    Transfers Overall transfer level: Needs assistance Equipment used: Rolling walker (2 wheels) Transfers: Sit to/from Stand, Bed to chair/wheelchair/BSC Sit to Stand: Min assist   Step pivot transfers: Min assist, Mod assist       General transfer comment: very unsteady; poor endurance for standing    Ambulation/Gait Ambulation/Gait assistance: Min assist, Mod assist Gait Distance (Feet): 10 Feet Assistive device: Rolling walker (2 wheels) Gait Pattern/deviations: Decreased step length - left, Decreased stance time - right, Decreased stride length Gait velocity: slow     General Gait Details: limited to a few steps away from bed due to fear of falling and pain   Stairs  Wheelchair Mobility     Tilt Bed     Modified Rankin (Stroke Patients Only)       Balance Overall balance assessment: Needs assistance Sitting-balance support: Feet supported, Bilateral upper extremity supported Sitting balance-Leahy Scale: Fair Sitting balance - Comments: fair/good seated at EOB   Standing balance support: Reliant on assistive device for balance, During functional activity, Bilateral upper extremity supported Standing balance-Leahy Scale: Poor Standing balance comment: poor to fair with AD                            Communication Communication Communication: No apparent difficulties  Cognition Arousal: Alert Behavior During Therapy: WFL for tasks assessed/performed   PT - Cognitive impairments: No apparent impairments                         Following commands: Intact      Cueing Cueing Techniques: Verbal cues, Tactile cues  Exercises      General Comments        Pertinent Vitals/Pain Pain Assessment Pain Assessment: 0-10 Pain Score: 10-Worst pain ever Pain Location: low back Pain Descriptors / Indicators: Guarding, Grimacing, Moaning, Sharp Pain Intervention(s): Limited activity within patient's tolerance    Home Living Family/patient expects to be discharged to:: Private residence Living Arrangements: Spouse/significant other Available Help at Discharge: Family;Available PRN/intermittently Type of Home: House Home Access: Stairs to enter Entrance Stairs-Rails: None Entrance Stairs-Number of Steps: 1   Home Layout: One level Home Equipment: Agricultural consultant (2 wheels);Electric scooter;Shower seat;Hand held shower head      Prior Function            PT Goals (current goals can now be found in the care plan section) Acute Rehab PT Goals Patient Stated Goal: return home with family to assist PT Goal Formulation: With patient Time For Goal Achievement: 01/13/24 Potential to Achieve Goals: Good Progress towards PT goals: Progressing toward goals     Frequency    Min 3X/week      PT Plan      Co-evaluation              AM-PAC PT "6 Clicks" Mobility   Outcome Measure  Help needed turning from your back to your side while in a flat bed without using bedrails?: A Little Help needed moving from lying on your back to sitting on the side of a flat bed without using bedrails?: A Lot Help needed moving to and from a bed to a chair (including a wheelchair)?: A Little Help needed standing up from a chair using your arms (e.g., wheelchair or bedside chair)?: A Lot Help needed to walk in hospital room?: A Little Help needed climbing 3-5 steps with a railing? : A Lot 6 Click Score: 15    End of Session Equipment Utilized During Treatment: Gait belt Activity Tolerance: Patient tolerated treatment well;Patient limited by fatigue Patient left: in chair;with call bell/phone within reach Nurse Communication: Mobility status PT Visit Diagnosis: Unsteadiness on feet (R26.81);Other abnormalities of gait and mobility (R26.89);Muscle weakness (generalized) (M62.81)     Time: 4098-1191 PT Time Calculation (min) (ACUTE ONLY): 23 min  Charges:    $Therapeutic Activity: 8-22 mins PT General Charges $$ ACUTE PT VISIT: 1 Visit                     Armond Bertin, PT, DPT [facility] Office: [phone] 3:11 PM, 01/07/24

## 2024-01-07 NOTE — Progress Notes (Signed)
 OT Cancellation Note  Patient Details Name: Kathleen [name] MRN: [id number] DOB: 04-30-1961   Cancelled Treatment:    Reason Eval/Treat Not Completed: Other (comment). MD present in the room. Pt requested this therapist come back later.   Kameren Pargas OT, MOT   Thurnell Floss 01/07/2024, 12:01 PM

## 2024-01-07 NOTE — Plan of Care (Signed)
  Problem: Acute Rehab OT Goals (only OT should resolve) Goal: Pt. Will Perform Grooming Flowsheets (Taken 01/07/2024 1346) Pt Will Perform Grooming: with modified independence Goal: Pt. Will Perform Upper Body Dressing Flowsheets (Taken 01/07/2024 1346) Pt Will Perform Upper Body Dressing: with modified independence Goal: Pt. Will Transfer To Toilet Flowsheets (Taken 01/07/2024 1346) Pt Will Transfer to Toilet: with modified independence Goal: Pt. Will Perform Toileting-Clothing Manipulation Flowsheets (Taken 01/07/2024 1346) Pt Will Perform Toileting - Clothing Manipulation and hygiene: with min assist Goal: Pt/Caregiver Will Perform Home Exercise Program Flowsheets (Taken 01/07/2024 1346) Pt/caregiver will Perform Home Exercise Program:  Increased strength  Both right and left upper extremity  Independently  Eboney Claybrook OT, MOT

## 2024-01-07 NOTE — Evaluation (Addendum)
 Occupational Therapy Evaluation Patient Details Name: Kathleen [name] MRN: [id number] DOB: 1960/12/17 Today's Date: 01/07/2024   History of Present Illness   Kathleen [name]  is a 63 y.o. female,  with medical history significant for chronic respiratory failure on 5 L, interstitial lung disease, sarcoidosis, Addison's disease, chronic pain syndrome, anxiety and depression, hypothyroidism.  Patient with recent admission secondary to L4 compression fracture and intractable back pain, she was discharged to SNF, was just discharged from SNF 3 days ago, and did follow-up with her orthopedic/neurosurgery recently, where she will need medical clearance given her multiple comorbidities for considering any neurosurgery .  -Patient presents to ED secondary to worsening back pain, patient reports she got on the commode, twisted her back, then she developed severe lower back pain, reports similar to previous back pain, she also reports some urinary retention, which she had previously during recent admission with her severe back pain, she denies any urinary or stool incontinence, she also reports constipation which is chronic, no fever, no chills, no abdominal pain, no lower extremity weakness, no saddle paresthesia,.  - NAD MRI of lumbar spine showing evidence of Acute compression fracture of superior endplate of L3, Acute to subacute compression fracture of superior endplate of L4, she required multiple doses of IV pain medication for pain control, she was noted to have urinary retention where she had 600 cc output with In-N-Out, [facility] hospitalist consulted to admit     Clinical Impressions Pt agreeable to OT evaluation. Pt reports having husband at home to provide assist and other help if husband is out. Pt required min A for bed mobility and min to mod A for transfer to chair mostly due to instability and safety risk. Educated pt on need for someone to be there during mobility at home. B UE generally weak. Much assist  for ADL's at baseline. Pt left in the chair with call bell within reach. Pt will benefit from continued OT in the hospital and recommended venue below to increase strength, balance, and endurance for safe ADL's.        If plan is discharge home, recommend the following:   A lot of help with walking and/or transfers;A lot of help with bathing/dressing/bathroom;Assistance with cooking/housework;Help with stairs or ramp for entrance;Assist for transportation     Functional Status Assessment   Patient has had a recent decline in their functional status and demonstrates the ability to make significant improvements in function in a reasonable and predictable amount of time.     Equipment Recommendations   BSC; pt reported that social work was already taking care of this.              Precautions/Restrictions   Precautions Precautions: Fall Recall of Precautions/Restrictions: Intact Precaution/Restrictions Comments: LSO brace Restrictions Weight Bearing Restrictions Per Provider Order: No     Mobility Bed Mobility Overal bed mobility: Needs Assistance Bed Mobility: Rolling, Sidelying to Sit Rolling: Supervision Sidelying to sit: Min assist       General bed mobility comments: increased time, labord movement    Transfers Overall transfer level: Needs assistance Equipment used: Rolling walker (2 wheels) Transfers: Sit to/from Stand, Bed to chair/wheelchair/BSC Sit to Stand: Min assist     Step pivot transfers: Min assist, Mod assist     General transfer comment: very unsteady; poor endurance for standing      Balance Overall balance assessment: Needs assistance Sitting-balance support: Feet supported, Bilateral upper extremity supported Sitting balance-Leahy Scale: Fair Sitting balance - Comments: fair/good  seated at EOB   Standing balance support: Reliant on assistive device for balance, During functional activity, Bilateral upper extremity  supported Standing balance-Leahy Scale: Poor Standing balance comment: poor to fair with AD                           ADL either performed or assessed with clinical judgement   ADL Overall ADL's : Needs assistance/impaired     Grooming: Set up;Sitting   Upper Body Bathing: Set up;Sitting;Minimal assistance   Lower Body Bathing: Maximal assistance;Total assistance;Sitting/lateral leans   Upper Body Dressing : Sitting;Minimal assistance;Set up   Lower Body Dressing: Maximal assistance;Total assistance;Sitting/lateral leans   Toilet Transfer: Minimal assistance;Moderate assistance;Stand-pivot;Rolling walker (2 wheels)   Toileting- Clothing Manipulation and Hygiene: Maximal assistance;Total assistance;Sitting/lateral lean       Functional mobility during ADLs: Minimal assistance;Moderate assistance;Rolling walker (2 wheels)       Vision Baseline Vision/History: 1 Wears glasses Ability to See in Adequate Light: 1 Impaired Patient Visual Report: No change from baseline Vision Assessment?: No apparent visual deficits     Perception Perception: Not tested       Praxis Praxis: Not tested       Pertinent Vitals/Pain Pain Assessment Pain Assessment: Faces Faces Pain Scale: Hurts whole lot Pain Location: low back with movement Pain Descriptors / Indicators: Guarding, Grimacing, Moaning, Sharp Pain Intervention(s): Limited activity within patient's tolerance, Monitored during session, Repositioned     Extremity/Trunk Assessment Upper Extremity Assessment Upper Extremity Assessment: Generalized weakness   Lower Extremity Assessment Lower Extremity Assessment: Defer to PT evaluation   Cervical / Trunk Assessment Cervical / Trunk Assessment: Normal   Communication Communication Communication: No apparent difficulties   Cognition Arousal: Alert Behavior During Therapy: WFL for tasks assessed/performed Cognition: No apparent impairments                                Following commands: Intact       Cueing  General Comments   Cueing Techniques: Verbal cues;Tactile cues                 Home Living Family/patient expects to be discharged to:: Private residence Living Arrangements: Spouse/significant other Available Help at Discharge: Family;Available PRN/intermittently Type of Home: House Home Access: Stairs to enter [employer] of Steps: 1 Entrance Stairs-Rails: None Home Layout: One level     Bathroom Shower/Tub: Chief Strategy Officer: Handicapped height Bathroom Accessibility: Yes   Home Equipment: Agricultural consultant (2 wheels);Electric scooter;Shower seat;Hand held shower head          Prior Functioning/Environment Prior Level of Function : Needs assist       Physical Assist : Mobility (physical);ADLs (physical) Mobility (physical): Bed mobility;Transfers;Gait;Stairs ADLs (physical): Dressing;Bathing;Toileting;IADLs Mobility Comments: short household distances using RW, scooter ADLs Comments: Husband assist with most ADL's pt able to feed self and comb hair. (as per PT note)    OT Problem List: Decreased strength;Decreased activity tolerance;Impaired balance (sitting and/or standing);Obesity;Pain   OT Treatment/Interventions: Self-care/ADL training;Therapeutic exercise;Therapeutic activities;Balance training;Patient/family education      OT Goals(Current goals can be found in the care plan section)   Acute Rehab OT Goals Patient Stated Goal: return home OT Goal Formulation: With patient Time For Goal Achievement: 01/21/24 Potential to Achieve Goals: Good   OT Frequency:  Min 2X/week  End of Session Equipment Utilized During Treatment: Rolling walker (2 wheels);Gait belt;Oxygen  Nurse Communication: Mobility status  Activity Tolerance: Patient tolerated treatment well Patient left: in chair;with call bell/phone within  reach  OT Visit Diagnosis: Unsteadiness on feet (R26.81);Other abnormalities of gait and mobility (R26.89);Muscle weakness (generalized) (M62.81)                Time: 9147-8295 OT Time Calculation (min): 18 min Charges:  OT General Charges $OT Visit: 1 Visit OT Evaluation $OT Eval Low Complexity: 1 Low  Jylian Pappalardo OT, MOT  Thurnell Floss 01/07/2024, 1:44 PM

## 2024-01-07 NOTE — Plan of Care (Signed)
?  Problem: Education: ?Goal: Knowledge of General Education information will improve ?Description: Including pain rating scale, medication(s)/side effects and non-pharmacologic comfort measures ?Outcome: Progressing ?  ?Problem: Clinical Measurements: ?Goal: Ability to maintain clinical measurements within normal limits will improve ?Outcome: Progressing ?Goal: Will remain free from infection ?Outcome: Progressing ?Goal: Diagnostic test results will improve ?Outcome: Progressing ?Goal: Respiratory complications will improve ?Outcome: Progressing ?Goal: Cardiovascular complication will be avoided ?Outcome: Progressing ?  ?Problem: Nutrition: ?Goal: Adequate nutrition will be maintained ?Outcome: Progressing ?  ?Problem: Safety: ?Goal: Ability to remain free from injury will improve ?Outcome: Progressing ?  ?Problem: Skin Integrity: ?Goal: Risk for impaired skin integrity will decrease ?Outcome: Progressing ?  ?

## 2024-01-08 ENCOUNTER — Other Ambulatory Visit: Payer: Self-pay

## 2024-01-08 ENCOUNTER — Other Ambulatory Visit (HOSPITAL_COMMUNITY): Payer: Self-pay

## 2024-01-08 DIAGNOSIS — M5459 Other low back pain: Secondary | ICD-10-CM | POA: Diagnosis not present

## 2024-01-08 MED ORDER — OXYCODONE HCL 15 MG PO TABS
15.0000 mg | ORAL_TABLET | ORAL | 0 refills | Status: DC | PRN
  Filled 2024-01-08: qty 30, 5d supply, fill #0

## 2024-01-08 MED ORDER — POLYETHYLENE GLYCOL 3350 17 G PO PACK
17.0000 g | PACK | Freq: Two times a day (BID) | ORAL | 0 refills | Status: DC
  Filled 2024-01-08: qty 14, 7d supply, fill #0

## 2024-01-08 MED ORDER — ALBUTEROL SULFATE (2.5 MG/3ML) 0.083% IN NEBU
INHALATION_SOLUTION | RESPIRATORY_TRACT | 0 refills | Status: DC
  Filled 2024-01-08: qty 90, 5d supply, fill #0

## 2024-01-08 MED ORDER — BISACODYL 10 MG RE SUPP
10.0000 mg | Freq: Every day | RECTAL | 0 refills | Status: DC | PRN
  Filled 2024-01-08: qty 12, 12d supply, fill #0

## 2024-01-08 MED ORDER — DOCUSATE SODIUM 100 MG PO CAPS
100.0000 mg | ORAL_CAPSULE | Freq: Two times a day (BID) | ORAL | 0 refills | Status: DC
  Filled 2024-01-08: qty 60, 30d supply, fill #0

## 2024-01-08 MED ORDER — ACETAMINOPHEN 500 MG PO TABS
1000.0000 mg | ORAL_TABLET | Freq: Three times a day (TID) | ORAL | 0 refills | Status: DC
  Filled 2024-01-08: qty 30, 5d supply, fill #0

## 2024-01-08 MED ORDER — LACTULOSE 10 GM/15ML PO SOLN
10.0000 g | Freq: Four times a day (QID) | ORAL | 0 refills | Status: DC | PRN
  Filled 2024-01-08: qty 236, 4d supply, fill #0

## 2024-01-08 MED ORDER — METHOCARBAMOL 750 MG PO TABS
750.0000 mg | ORAL_TABLET | Freq: Three times a day (TID) | ORAL | 0 refills | Status: DC | PRN
  Filled 2024-01-08: qty 30, 10d supply, fill #0

## 2024-01-08 NOTE — Progress Notes (Signed)
 Foley removed & medicated. Waiting for husband to bring home oxygen  & then can DC.

## 2024-01-08 NOTE — Progress Notes (Addendum)
 Mobility Specialist Progress Note:    01/08/24 1507  Mobility  Activity Transferred from bed to chair  Level of Assistance Maximum assist, patient does 25-49%  Assistive Device None  Range of Motion/Exercises Active;All extremities  Activity Response Tolerated well  Mobility visit 1 Mobility   Pt received sitting EOB, NT requesting assistance to transfer to [facility]. Required MaxA to stand and transfer with no AD. Tolerated well, c/o back pain. Assisted NT and husband to transfer pt to truck for discharge. Also required MaxA. Left pt with husband, all needs met.  Glinda Lapping Mobility Specialist Please contact via Special educational needs teacher or  Rehab office at [phone]

## 2024-01-08 NOTE — TOC Transition Note (Signed)
 Transition of Care [facility]) - Discharge Note   Patient Details  Name: Kathleen [name] MRN: [id number] Date of Birth: 12/29/1960  Transition of Care [facility]) CM/SW Contact:  Grandville Lax, LCSWA Phone Number: 01/08/2024, 9:48 AM  Clinical Narrative:    CSW updated that pt is medically stable for D/C home today. CSW updated Randel Buss with [facility] of plan for D/C, HH orders have been placed, they will follow pt in the community. Pts bari BSC has been ordered through [facility] with [employer] and will deliver to pts home. TOC signing off.   Final next level of care: Home w Home Health Services Barriers to Discharge: Barriers Resolved   Patient Goals and CMS Choice Patient states their goals for this hospitalization and ongoing recovery are:: return home CMS Medicare.gov Compare Post Acute Care list provided to:: Patient Choice offered to / list presented to : Patient      Discharge Placement                       Discharge Plan and Services Additional resources added to the After Visit Summary for                  DME Arranged: 3-N-1 DME Agency: [employer] Date DME Agency Contacted: 01/08/24   Representative spoke with at DME Agency: [facility] HH Arranged: RN, PT, OT [facility] Agency: [facility] Date [facility] Agency Contacted: 01/08/24   Representative spoke with at [facility] Agency: Randel Buss  Social Drivers of Health (SDOH) Interventions SDOH Screenings   Food Insecurity: No Food Insecurity (01/03/2024)  Housing: Low Risk  (01/03/2024)  Transportation Needs: No Transportation Needs (01/03/2024)  Utilities: Not At Risk (01/03/2024)  Alcohol Screen: Low Risk  (09/27/2023)  Depression (PHQ2-9): Low Risk  (01/01/2024)  Financial Resource Strain: Low Risk  (09/27/2023)  Physical Activity: Inactive (09/27/2023)  Social Connections: Socially Integrated (01/03/2024)  Stress: No Stress Concern Present (09/27/2023)  Tobacco Use: Low Risk  (01/03/2024)  Health Literacy: Medium Risk (12/20/2023)   Received from [facility]  Care     Readmission Risk Interventions    12/11/2023   11:11 AM 04/08/2023   10:38 AM  Readmission Risk Prevention Plan  Transportation Screening Complete Complete  HRI or Home Care Consult Complete   Social Work Consult for Recovery Care Planning/Counseling Complete   Palliative Care Screening Not Applicable   Medication Review [employer]) Complete Complete  HRI or Home Care Consult  Complete  SW Recovery Care/Counseling Consult  Complete  Palliative Care Screening  Not Applicable  Skilled Nursing Facility  Not Applicable

## 2024-01-08 NOTE — Progress Notes (Signed)
 Mobility Specialist Progress Note:    01/08/24 1506  Mobility  Activity Transferred to/from [facility]  Level of Assistance Maximum assist, patient does 25-49%  Assistive Device Front wheel walker  Range of Motion/Exercises Active;All extremities  Activity Response Tolerated well  Mobility visit 1 Mobility   Pt received on [facility] requesting assistance. Required MaxA to stand and pvito with RW. Tolerated well, c/o back pain. Left pt in bed, husband in room. All needs met.  Glinda Lapping Mobility Specialist Please contact via Special educational needs teacher or  Rehab office at [phone]

## 2024-01-08 NOTE — Discharge Summary (Addendum)
 Physician Discharge Summary  ZYRIELLE HAZARD WUJ:[id number] DOB: 08/11/1961 DOA: 01/03/2024  PCP: Bennet Brasil, MD  Admit date: 01/03/2024  Discharge date: 01/08/2024  Admitted From:Home  Disposition:  Home  Recommendations for Outpatient Follow-up:  Follow up with PCP in 1-2 weeks Remain on pain medications as prescribed below Continue to wear brace Continue on other home medications as prior Pt to continue to straight cath at home, refused foley Follow-up with Dr. [name] at [facility] for possible kyphoplasty after clearance by her pulmonologist at [facility]: Yes  Equipment/Devices: Has 5 L continuous nasal cannula, Foley catheter  Discharge Condition:Stable  CODE STATUS: Full  Diet recommendation: Heart Healthy  Brief/Interim Summary: Kathleen [name]  is a 63 y.o. female,  with medical history significant for chronic respiratory failure on 5 L, interstitial lung disease, sarcoidosis, Addison's disease, chronic pain syndrome, anxiety and depression, hypothyroidism.  Patient with recent admission secondary to L4 compression fracture and intractable back pain, she was discharged to SNF, was just discharged from SNF 3 days ago, and did follow-up with her orthopedic/neurosurgery recently, where she will need medical clearance given her multiple comorbidities for considering any neurosurgery . -Patient presents to ED secondary to worsening back pain, patient reports she got on the commode, twisted her back, then she developed severe lower back pain, reports similar to previous back pain, she also reports some urinary retention, which she had previously during recent admission with her severe back pain, she denies any urinary or stool incontinence, she also reports constipation which is chronic, no fever, no chills, no abdominal pain, no lower extremity weakness, no saddle paresthesia. - NAD MRI of lumbar spine showing evidence of Acute compression fracture of superior endplate  of L3, Acute to subacute compression fracture of superior endplate of L4, she required multiple doses of IV pain medication for pain control, she was noted to have urinary retention where she had 600 cc output with In-N-Out, [facility] consulted to admit.  Patient was managed here for her intractable lower back pain and is now only on oral medications and continues to have ongoing pain, but this appears to be managed at this time.  She has been seen by PT with recommendations for home health services which have been arranged.  She is also noted to have acute urinary retention and failed voiding trial and requires Foley catheter which was replaced on 5/13.  She does not want to discharge with Foley catheter and will continue to straight cath at home.  Discharge Diagnoses:  Principal Problem:   Intractable low back pain Active Problems:   Chronic respiratory failure with hypoxia (HCC)   Fibromyalgia   Chronic pain syndrome   Hypothyroidism   GERD (gastroesophageal reflux disease)   Vitamin D  deficiency   Adrenal insufficiency (Addison's disease) (HCC)   Ambulatory dysfunction  Principal discharge diagnosis: Intractable low back pain secondary to acute compression fracture.  Acute urinary retention.  Discharge Instructions  Discharge Instructions     Ambulatory referral to Urology   Complete by: As directed    Diet - low sodium heart healthy   Complete by: As directed    Increase activity slowly   Complete by: As directed       Allergies as of 01/08/2024       Reactions   Bee Venom Anaphylaxis   Contrast Media [iodinated Contrast Media] Other (See Comments)   CT chest w/ contrast at OSH followed by SOB resolved w/ single dose steroids, never ENT swelling,  intubation or pressors, has had multiple contrasted scans since   Morphine  Other (See Comments)   Substance with morphinan structure and opioid receptor agonist mechanism of action (substance)   Bactrim   [sulfamethoxazole -trimethoprim ] Nausea And Vomiting   Codeine Itching        Medication List     STOP taking these medications    HYDROmorphone  2 MG tablet Commonly known as: DILAUDID    oxyCODONE -acetaminophen  10-325 MG tablet Commonly known as: PERCOCET       TAKE these medications    acetaminophen  500 MG tablet Commonly known as: TYLENOL  Take 2 tablets (1,000 mg total) by mouth 3 (three) times daily. What changed:  medication strength how much to take when to take this Another medication with the same name was removed. Continue taking this medication, and follow the directions you see here.   albuterol  (2.5 MG/3ML) 0.083% nebulizer solution Commonly known as: PROVENTIL  Take 3 mLs (2.5 mg total) by nebulization every 4 (four) hours as needed for wheezing or shortness of breath.   albuterol  (2.5 MG/3ML) 0.083% nebulizer solution Commonly known as: PROVENTIL  Inhale 3 mL (2.5 mg total) by nebulization every four (4) hours as needed for wheezing or shortness of breath.   bisacodyl  10 MG suppository Commonly known as: DULCOLAX Place 1 suppository (10 mg total) rectally daily as needed for severe constipation.   budesonide  0.5 MG/2ML nebulizer solution Commonly known as: PULMICORT  Inhale the contents of 1 vial via nebulizer two (2) times a day.   busPIRone  15 MG tablet Commonly known as: BUSPAR  Take 1 tablet (15 mg) by mouth 3 times daily.   calcitonin (salmon) 200 UNIT/ACT nasal spray Commonly known as: Miacalcin  Use 1 spray in one nostril per day, alternate nostrils every other day (may have 1 vial she should use this for at least 2 weeks but may use as long as 4 weeks)   docusate sodium  100 MG capsule Commonly known as: COLACE Take 1 capsule (100 mg total) by mouth 2 (two) times daily.   DULoxetine  60 MG capsule Commonly known as: CYMBALTA  Take 1 capsule (60 mg total) by mouth in the morning.   EPINEPHrine  0.3 mg/0.3 mL Soaj injection Commonly known as:  EPI-PEN Inject 0.3 mg into the muscle as needed for anaphylaxis.   ferrous sulfate  325 (65 FE) MG EC tablet Take 325 mg by mouth once a week.   furosemide  20 MG tablet Commonly known as: LASIX  Take 1 tablet (20 mg total) by mouth every Monday, Wednesday, and Friday.   hydrocortisone  10 MG tablet Commonly known as: CORTEF  Take 2 tablets by mouth at 8:00 am and 1 tablet at lunch. (May double dose on sick days x 3-5 days)   hydrOXYzine  25 MG tablet Commonly known as: ATARAX  Take 1 tablet (25 mg total) by mouth 3 (three) times daily as needed for anxiety.   ipratropium-albuterol  0.5-2.5 (3) MG/3ML Soln Commonly known as: DUONEB Take 3 mLs by nebulization every 8 (eight) hours.   lactulose 10 GM/15ML solution Commonly known as: CHRONULAC Take 15 mLs (10 g total) by mouth every 6 (six) hours as needed for mild constipation.   levothyroxine  50 MCG tablet Commonly known as: SYNTHROID  Take 1 tablet by mouth daily before breakfast.   lidocaine  5 % Commonly known as: LIDODERM  Place 1 patch onto the skin daily. Remove & Discard patch within 12 hours or as directed by MD   Linzess  290 MCG Caps capsule Generic drug: linaclotide  Take 1 capsule (290 mcg total) by mouth daily before breakfast.  methocarbamol  750 MG tablet Commonly known as: ROBAXIN  Take 1 tablet (750 mg total) by mouth every 8 (eight) hours as needed for muscle spasms.   midodrine  5 MG tablet Commonly known as: PROAMATINE  Take 1 tablet (5 mg total) by mouth 2 (two) times daily with a meal.   ondansetron  8 MG disintegrating tablet Commonly known as: ZOFRAN -ODT Dissolve 1 tablet (8 mg total) by mouth every 8 (eight) hours as needed. What changed: reasons to take this   oxyCODONE  15 MG immediate release tablet Commonly known as: ROXICODONE  Take 1 tablet (15 mg total) by mouth every 4 (four) hours as needed for moderate pain (pain score 4-6) or severe pain (pain score 7-10).   OXYGEN  Inhale 5 L into the lungs  continuous.   pantoprazole  40 MG tablet Commonly known as: PROTONIX  Take 1 tablet (40 mg total) by mouth 2 (two) times daily before a meal.   polyethylene glycol 17 g packet Commonly known as: MIRALAX  / GLYCOLAX  Take 17 g by mouth 2 (two) times daily.   predniSONE  5 MG tablet Commonly known as: DELTASONE  Take 1 tablet (5 mg total) by mouth in the morning.   rosuvastatin  20 MG tablet Commonly known as: Crestor  Take 1 tablet (20 mg total) by mouth daily.   SOLU-Medrol  (PF) 125 MG injection ACT-O-VIAL Generic drug: methylPREDNISolone  sodium succinate  Administer as directed.  Giving IV is preferred, but can also be given IM if IV access is difficult.   tamsulosin  0.4 MG Caps capsule Commonly known as: FLOMAX  Take 1 capsule (0.4 mg total) by mouth daily after supper.               Durable Medical Equipment  (From admission, onward)           Start     Ordered   01/06/24 1532  For home use only DME 3 n 1  Once       Comments: Bariatric needed   01/06/24 1532            Follow-up Information     Luking, Jackelyn Marvel, MD. Schedule an appointment as soon as possible for a visit in 1 week(s).   Specialty: Family Medicine Contact information: [address] [place] [address] [phone]                Allergies  Allergen Reactions   Bee Venom Anaphylaxis   Contrast Media [Iodinated Contrast Media] Other (See Comments)    CT chest w/ contrast at OSH followed by SOB resolved w/ single dose steroids, never ENT swelling, intubation or pressors, has had multiple contrasted scans since   Morphine  Other (See Comments)    Substance with morphinan structure and opioid receptor agonist mechanism of action (substance)   Bactrim  [Sulfamethoxazole -Trimethoprim ] Nausea And Vomiting   Codeine Itching    Consultations: None   Procedures/Studies: MR LUMBAR SPINE WO CONTRAST Result Date: 01/03/2024 CLINICAL DATA:  Initial evaluation for acute low back  pain, injury earlier today. EXAM: MRI LUMBAR SPINE WITHOUT CONTRAST TECHNIQUE: Multiplanar, multisequence MR imaging of the lumbar spine was performed. No intravenous contrast was administered. COMPARISON:  Prior study from 12/07/2023 FINDINGS: Segmentation: Transitional lumbosacral anatomy with partial sacralization of the L5 vertebral body on the left. Same numbering system employed as on previous exam. Alignment: Vertebral bodies normally aligned with preservation of the normal cervical lordosis. No significant interval listhesis or malalignment. Vertebrae: Previously identified acute to subacute compression fracture involving the superior endplate of L4 again seen. Associated height loss  is relatively stable from prior persistent marrow edema without significant progressive height loss since prior. Associated 3 mm bony retropulsion, similar. There is a new acute compression fracture involving the superior endplate of L3. Associated height loss measures up to 30% centrally with no more than trace 2 mm retropulsion. This is benign/mechanical in appearance. Vertebral body height otherwise maintained. Bone marrow signal intensity within normal limits. No worrisome osseous lesions. Mild reactive edema about the S2 and S3 segments without fracture deformity, stable. Conus medullaris and cauda equina: Conus extends to the T12-L1 level. Conus and cauda equina appear normal. Paraspinal and other soft tissues: Paraspinous soft tissues demonstrate no acute finding. Subcentimeter T2 hyperintense cyst at the lower pole of the left kidney, benign in appearance, no follow-up imaging recommended. Disc levels: T11-12: Seen only on sagittal projection. Disc desiccation with small central disc protrusion. No stenosis. T12-L1: Unremarkable. L1-2: Unremarkable. L2-3: Acute compression fracture involving the superior endplate of L3 with no more than trace 2 mm bony retropulsion. Mild facet hypertrophy. No significant spinal stenosis.  Foramina remain patent. L3-4: Disc desiccation without significant disc bulge. 3 mm bony retropulsion related to the acute to subacute L4 compression fracture. Mild bilateral facet hypertrophy. Resultant mild narrowing of the lateral recesses bilaterally. Central canal remains patent. No significant foraminal stenosis. L4-5: Disc desiccation with minimal annular bulge. Mild facet hypertrophy. Epidural lipomatosis. No significant canal or foraminal stenosis. L5-S1: Normal interspace. Epidural lipomatosis. No canal or foraminal stenosis. IMPRESSION: 1. Acute compression fracture involving the superior endplate of L3 with up to 30% height loss and trace 2 mm bony retropulsion. No significant stenosis. This is new as compared to recent MRI from 12/07/2023. 2. Acute to subacute compression fracture involving the superior endplate of L4 with associated 3 mm bony retropulsion, not significantly changed from prior. 3. Underlying mild for age degenerative spondylosis without significant stenosis or neural impingement. Electronically Signed   By: Virgia Griffins M.D.   On: 01/03/2024 20:06   DG Chest Port 1 View Result Date: 01/03/2024 CLINICAL DATA:  Patient presents after "twisting her back today". EXAM: PORTABLE CHEST 1 VIEW COMPARISON:  August 05, 2023 FINDINGS: Limited study secondary to overlying breast attenuation artifact. The cardiac silhouette is limited in evaluation but appears to be stable. There is mild to moderate severity prominence of the central pulmonary vasculature with mild diffusely increased interstitial lung markings. No pleural effusion or pneumothorax is identified. The visualized skeletal structures are unremarkable. IMPRESSION: 1. Limited study secondary to overlying breast attenuation artifact. 2. Pulmonary vascular congestion. A component of superimposed interstitial edema cannot be excluded. Electronically Signed   By: Virgle Grime M.D.   On: 01/03/2024 19:20     Discharge  Exam: Vitals:   01/08/24 0518 01/08/24 0710  BP: (!) 124/59   Pulse: 94   Resp: 20   Temp: 98 F (36.7 C)   SpO2: 99% 98%   Vitals:   01/07/24 2018 01/07/24 2116 01/08/24 0518 01/08/24 0710  BP:  120/65 (!) 124/59   Pulse:  100 94   Resp:  18 20   Temp:  98.1 F (36.7 C) 98 F (36.7 C)   TempSrc:  Oral Oral   SpO2: 97% 100% 99% 98%  Weight:      Height:        General: Pt is alert, awake, not in acute distress Cardiovascular: RRR, S1/S2 +, no rubs, no gallops Respiratory: CTA bilaterally, no wheezing, no rhonchi Abdominal: Soft, NT, ND, bowel sounds + Extremities: no edema,  no cyanosis    The results of significant diagnostics from this hospitalization (including imaging, microbiology, ancillary and laboratory) are listed below for reference.     Microbiology: No results found for this or any previous visit (from the past 240 hours).   Labs: BNP (last 3 results) Recent Labs    02/25/23 1705 07/26/23 1004 08/05/23 0911  BNP 49.0 14.5 91.0   Basic Metabolic Panel: Recent Labs  Lab 01/03/24 1756 01/04/24 0449 01/06/24 0458 01/07/24 0500  NA 141 141 141 141  K 4.3 4.0 3.7 3.7  CL 97* 95* 95* 96*  CO2 37* 35* 35* 36*  GLUCOSE 125* 156* 124* 121*  BUN 9 11 10 12   CREATININE 0.68 0.74 0.83 0.93  CALCIUM  9.3 9.2 9.2 9.2  MG  --   --  2.1  --    Liver Function Tests: No results for input(s): "AST", "ALT", "ALKPHOS", "BILITOT", "PROT", "ALBUMIN" in the last 168 hours. No results for input(s): "LIPASE", "AMYLASE" in the last 168 hours. No results for input(s): "AMMONIA " in the last 168 hours. CBC: Recent Labs  Lab 01/03/24 1756 01/04/24 0449 01/06/24 0458  WBC 9.5 8.4 7.0  NEUTROABS 7.5  --   --   HGB 9.1* 8.7* 9.2*  HCT 31.2* 30.4* 31.0*  MCV 97.5 99.7 98.1  PLT 367 375 396   Cardiac Enzymes: No results for input(s): "CKTOTAL", "CKMB", "CKMBINDEX", "TROPONINI" in the last 168 hours. BNP: Invalid input(s): "POCBNP" CBG: No results for  input(s): "GLUCAP" in the last 168 hours. D-Dimer No results for input(s): "DDIMER" in the last 72 hours. Hgb A1c No results for input(s): "HGBA1C" in the last 72 hours. Lipid Profile No results for input(s): "CHOL", "HDL", "LDLCALC", "TRIG", "CHOLHDL", "LDLDIRECT" in the last 72 hours. Thyroid  function studies No results for input(s): "TSH", "T4TOTAL", "T3FREE", "THYROIDAB" in the last 72 hours.  Invalid input(s): "FREET3" Anemia work up No results for input(s): "VITAMINB12", "FOLATE", "FERRITIN", "TIBC", "IRON", "RETICCTPCT" in the last 72 hours. Urinalysis    Component Value Date/Time   COLORURINE STRAW (A) 01/03/2024 1738   APPEARANCEUR CLEAR 01/03/2024 1738   LABSPEC 1.003 (L) 01/03/2024 1738   PHURINE 7.0 01/03/2024 1738   GLUCOSEU NEGATIVE 01/03/2024 1738   GLUCOSEU NEGATIVE 05/29/2010 1622   HGBUR NEGATIVE 01/03/2024 1738   BILIRUBINUR NEGATIVE 01/03/2024 1738   BILIRUBINUR + 11/08/2020 1604   KETONESUR NEGATIVE 01/03/2024 1738   PROTEINUR NEGATIVE 01/03/2024 1738   UROBILINOGEN 1.0 04/30/2020 0831   UROBILINOGEN 0.2 06/16/2014 0942   NITRITE NEGATIVE 01/03/2024 1738   LEUKOCYTESUR NEGATIVE 01/03/2024 1738   Sepsis Labs Recent Labs  Lab 01/03/24 1756 01/04/24 0449 01/06/24 0458  WBC 9.5 8.4 7.0   Microbiology No results found for this or any previous visit (from the past 240 hours).   Time coordinating discharge: 35 minutes  SIGNED:   Cornelius Dill, DO [facility] 01/08/2024, 11:02 AM  If 7PM-7AM, please contact night-coverage [email or web address]

## 2024-01-08 NOTE — Care Management Important Message (Signed)
 Important Message  Patient Details  Name: Kathleen [name] MRN: [id number] Date of Birth: 1961/06/15   Important Message Given:  Yes - Medicare IM     Tashayla Therien L [place] Brogden 01/08/2024, 12:32 PM

## 2024-01-09 ENCOUNTER — Other Ambulatory Visit (HOSPITAL_COMMUNITY): Payer: Self-pay

## 2024-01-09 ENCOUNTER — Other Ambulatory Visit: Payer: Self-pay | Admitting: "Endocrinology

## 2024-01-09 ENCOUNTER — Other Ambulatory Visit: Payer: Self-pay | Admitting: Family Medicine

## 2024-01-10 ENCOUNTER — Other Ambulatory Visit (HOSPITAL_COMMUNITY): Payer: Self-pay

## 2024-01-10 ENCOUNTER — Other Ambulatory Visit: Payer: Self-pay

## 2024-01-10 MED ORDER — CALCITONIN (SALMON) 200 UNIT/ACT NA SOLN
NASAL | 0 refills | Status: DC
  Filled 2024-01-10: qty 3.7, 30d supply, fill #0

## 2024-01-10 MED ORDER — TAMSULOSIN HCL 0.4 MG PO CAPS
0.4000 mg | ORAL_CAPSULE | Freq: Every day | ORAL | 11 refills | Status: DC
  Filled 2024-01-10: qty 30, 30d supply, fill #0

## 2024-01-11 DIAGNOSIS — K219 Gastro-esophageal reflux disease without esophagitis: Secondary | ICD-10-CM | POA: Diagnosis not present

## 2024-01-11 DIAGNOSIS — Z6834 Body mass index (BMI) 34.0-34.9, adult: Secondary | ICD-10-CM | POA: Diagnosis not present

## 2024-01-11 DIAGNOSIS — E271 Primary adrenocortical insufficiency: Secondary | ICD-10-CM | POA: Diagnosis not present

## 2024-01-11 DIAGNOSIS — M800AXD Age-related osteoporosis with current pathological fracture, other site, subsequent encounter for fracture with routine healing: Secondary | ICD-10-CM | POA: Diagnosis not present

## 2024-01-11 DIAGNOSIS — M8008XD Age-related osteoporosis with current pathological fracture, vertebra(e), subsequent encounter for fracture with routine healing: Secondary | ICD-10-CM | POA: Diagnosis not present

## 2024-01-11 DIAGNOSIS — F32A Depression, unspecified: Secondary | ICD-10-CM | POA: Diagnosis not present

## 2024-01-11 DIAGNOSIS — Z9181 History of falling: Secondary | ICD-10-CM | POA: Diagnosis not present

## 2024-01-11 DIAGNOSIS — R1310 Dysphagia, unspecified: Secondary | ICD-10-CM | POA: Diagnosis not present

## 2024-01-11 DIAGNOSIS — I951 Orthostatic hypotension: Secondary | ICD-10-CM | POA: Diagnosis not present

## 2024-01-11 DIAGNOSIS — D869 Sarcoidosis, unspecified: Secondary | ICD-10-CM | POA: Diagnosis not present

## 2024-01-11 DIAGNOSIS — D649 Anemia, unspecified: Secondary | ICD-10-CM | POA: Diagnosis not present

## 2024-01-11 DIAGNOSIS — F419 Anxiety disorder, unspecified: Secondary | ICD-10-CM | POA: Diagnosis not present

## 2024-01-11 DIAGNOSIS — G894 Chronic pain syndrome: Secondary | ICD-10-CM | POA: Diagnosis not present

## 2024-01-11 DIAGNOSIS — E039 Hypothyroidism, unspecified: Secondary | ICD-10-CM | POA: Diagnosis not present

## 2024-01-11 DIAGNOSIS — Z7951 Long term (current) use of inhaled steroids: Secondary | ICD-10-CM | POA: Diagnosis not present

## 2024-01-11 DIAGNOSIS — J849 Interstitial pulmonary disease, unspecified: Secondary | ICD-10-CM | POA: Diagnosis not present

## 2024-01-11 DIAGNOSIS — G629 Polyneuropathy, unspecified: Secondary | ICD-10-CM | POA: Diagnosis not present

## 2024-01-11 DIAGNOSIS — S32048D Other fracture of fourth lumbar vertebra, subsequent encounter for fracture with routine healing: Secondary | ICD-10-CM | POA: Diagnosis not present

## 2024-01-11 DIAGNOSIS — J9611 Chronic respiratory failure with hypoxia: Secondary | ICD-10-CM | POA: Diagnosis not present

## 2024-01-11 DIAGNOSIS — Z9981 Dependence on supplemental oxygen: Secondary | ICD-10-CM | POA: Diagnosis not present

## 2024-01-11 DIAGNOSIS — E559 Vitamin D deficiency, unspecified: Secondary | ICD-10-CM | POA: Diagnosis not present

## 2024-01-11 DIAGNOSIS — M797 Fibromyalgia: Secondary | ICD-10-CM | POA: Diagnosis not present

## 2024-01-13 ENCOUNTER — Telehealth: Payer: Self-pay

## 2024-01-13 ENCOUNTER — Other Ambulatory Visit (HOSPITAL_COMMUNITY): Payer: Self-pay

## 2024-01-13 MED ORDER — SOLU-MEDROL (PF) 125 MG IJ SOLR
INTRAMUSCULAR | 1 refills | Status: DC
  Filled 2024-01-13: qty 1, 1d supply, fill #0

## 2024-01-13 NOTE — Telephone Encounter (Signed)
 Communication  Caller/Agency: Adelina Homme    Callback Number: [phone]    Service Requested: Skilled Nursing    Frequency: 1w5 disease med management    Any new concerns about the patient? No

## 2024-01-14 ENCOUNTER — Other Ambulatory Visit (HOSPITAL_COMMUNITY): Payer: Self-pay

## 2024-01-14 DIAGNOSIS — S32040A Wedge compression fracture of fourth lumbar vertebra, initial encounter for closed fracture: Secondary | ICD-10-CM | POA: Diagnosis not present

## 2024-01-14 DIAGNOSIS — S32030A Wedge compression fracture of third lumbar vertebra, initial encounter for closed fracture: Secondary | ICD-10-CM | POA: Diagnosis not present

## 2024-01-14 NOTE — Telephone Encounter (Signed)
 Sarah at [place] given verba order from provider

## 2024-01-14 NOTE — Telephone Encounter (Signed)
 May have verbal order

## 2024-01-15 ENCOUNTER — Other Ambulatory Visit (HOSPITAL_COMMUNITY): Payer: Self-pay

## 2024-01-15 DIAGNOSIS — J849 Interstitial pulmonary disease, unspecified: Secondary | ICD-10-CM | POA: Diagnosis not present

## 2024-01-15 DIAGNOSIS — R0602 Shortness of breath: Secondary | ICD-10-CM | POA: Diagnosis not present

## 2024-01-15 DIAGNOSIS — D860 Sarcoidosis of lung: Secondary | ICD-10-CM | POA: Diagnosis not present

## 2024-01-17 ENCOUNTER — Other Ambulatory Visit (HOSPITAL_COMMUNITY): Payer: Self-pay

## 2024-01-18 ENCOUNTER — Other Ambulatory Visit (HOSPITAL_COMMUNITY): Payer: Self-pay

## 2024-01-20 ENCOUNTER — Other Ambulatory Visit: Payer: Self-pay

## 2024-01-20 ENCOUNTER — Emergency Department (HOSPITAL_COMMUNITY)

## 2024-01-20 ENCOUNTER — Inpatient Hospital Stay (HOSPITAL_COMMUNITY)
Admission: EM | Admit: 2024-01-20 | Discharge: 2024-01-23 | DRG: 552 | Disposition: A | Discharge status: Home Health | Attending: Family Medicine | Admitting: Family Medicine

## 2024-01-20 DIAGNOSIS — Z1152 Encounter for screening for COVID-19: Secondary | ICD-10-CM

## 2024-01-20 DIAGNOSIS — S32030A Wedge compression fracture of third lumbar vertebra, initial encounter for closed fracture: Secondary | ICD-10-CM | POA: Diagnosis present

## 2024-01-20 DIAGNOSIS — Z8042 Family history of malignant neoplasm of prostate: Secondary | ICD-10-CM

## 2024-01-20 DIAGNOSIS — M7989 Other specified soft tissue disorders: Secondary | ICD-10-CM | POA: Diagnosis present

## 2024-01-20 DIAGNOSIS — M8008XA Age-related osteoporosis with current pathological fracture, vertebra(e), initial encounter for fracture: Secondary | ICD-10-CM | POA: Diagnosis present

## 2024-01-20 DIAGNOSIS — R079 Chest pain, unspecified: Secondary | ICD-10-CM | POA: Diagnosis not present

## 2024-01-20 DIAGNOSIS — R918 Other nonspecific abnormal finding of lung field: Secondary | ICD-10-CM | POA: Diagnosis not present

## 2024-01-20 DIAGNOSIS — S32010A Wedge compression fracture of first lumbar vertebra, initial encounter for closed fracture: Secondary | ICD-10-CM | POA: Diagnosis not present

## 2024-01-20 DIAGNOSIS — E785 Hyperlipidemia, unspecified: Secondary | ICD-10-CM | POA: Diagnosis present

## 2024-01-20 DIAGNOSIS — G8929 Other chronic pain: Secondary | ICD-10-CM

## 2024-01-20 DIAGNOSIS — J849 Interstitial pulmonary disease, unspecified: Secondary | ICD-10-CM | POA: Diagnosis present

## 2024-01-20 DIAGNOSIS — Z803 Family history of malignant neoplasm of breast: Secondary | ICD-10-CM

## 2024-01-20 DIAGNOSIS — Z6841 Body Mass Index (BMI) 40.0 and over, adult: Secondary | ICD-10-CM

## 2024-01-20 DIAGNOSIS — E271 Primary adrenocortical insufficiency: Secondary | ICD-10-CM | POA: Diagnosis not present

## 2024-01-20 DIAGNOSIS — J90 Pleural effusion, not elsewhere classified: Secondary | ICD-10-CM | POA: Diagnosis not present

## 2024-01-20 DIAGNOSIS — K219 Gastro-esophageal reflux disease without esophagitis: Secondary | ICD-10-CM | POA: Diagnosis present

## 2024-01-20 DIAGNOSIS — S32000A Wedge compression fracture of unspecified lumbar vertebra, initial encounter for closed fracture: Secondary | ICD-10-CM | POA: Diagnosis present

## 2024-01-20 DIAGNOSIS — M549 Dorsalgia, unspecified: Secondary | ICD-10-CM | POA: Diagnosis present

## 2024-01-20 DIAGNOSIS — J9611 Chronic respiratory failure with hypoxia: Secondary | ICD-10-CM | POA: Diagnosis present

## 2024-01-20 DIAGNOSIS — Z7951 Long term (current) use of inhaled steroids: Secondary | ICD-10-CM

## 2024-01-20 DIAGNOSIS — E039 Hypothyroidism, unspecified: Secondary | ICD-10-CM | POA: Diagnosis not present

## 2024-01-20 DIAGNOSIS — M545 Low back pain, unspecified: Secondary | ICD-10-CM | POA: Diagnosis not present

## 2024-01-20 DIAGNOSIS — E559 Vitamin D deficiency, unspecified: Secondary | ICD-10-CM | POA: Diagnosis present

## 2024-01-20 DIAGNOSIS — S32040A Wedge compression fracture of fourth lumbar vertebra, initial encounter for closed fracture: Secondary | ICD-10-CM | POA: Diagnosis not present

## 2024-01-20 DIAGNOSIS — F321 Major depressive disorder, single episode, moderate: Secondary | ICD-10-CM | POA: Diagnosis present

## 2024-01-20 DIAGNOSIS — Z885 Allergy status to narcotic agent status: Secondary | ICD-10-CM

## 2024-01-20 DIAGNOSIS — J811 Chronic pulmonary edema: Secondary | ICD-10-CM | POA: Diagnosis not present

## 2024-01-20 DIAGNOSIS — M797 Fibromyalgia: Secondary | ICD-10-CM | POA: Diagnosis present

## 2024-01-20 DIAGNOSIS — R0989 Other specified symptoms and signs involving the circulatory and respiratory systems: Secondary | ICD-10-CM | POA: Diagnosis not present

## 2024-01-20 DIAGNOSIS — Z79899 Other long term (current) drug therapy: Secondary | ICD-10-CM

## 2024-01-20 DIAGNOSIS — Z91041 Radiographic dye allergy status: Secondary | ICD-10-CM

## 2024-01-20 DIAGNOSIS — Z8249 Family history of ischemic heart disease and other diseases of the circulatory system: Secondary | ICD-10-CM

## 2024-01-20 DIAGNOSIS — D649 Anemia, unspecified: Secondary | ICD-10-CM | POA: Diagnosis present

## 2024-01-20 DIAGNOSIS — Z91199 Patient's noncompliance with other medical treatment and regimen due to unspecified reason: Secondary | ICD-10-CM | POA: Diagnosis not present

## 2024-01-20 DIAGNOSIS — R339 Retention of urine, unspecified: Secondary | ICD-10-CM | POA: Diagnosis present

## 2024-01-20 DIAGNOSIS — Z882 Allergy status to sulfonamides status: Secondary | ICD-10-CM

## 2024-01-20 DIAGNOSIS — G629 Polyneuropathy, unspecified: Secondary | ICD-10-CM | POA: Diagnosis present

## 2024-01-20 DIAGNOSIS — F419 Anxiety disorder, unspecified: Secondary | ICD-10-CM | POA: Diagnosis present

## 2024-01-20 DIAGNOSIS — M87052 Idiopathic aseptic necrosis of left femur: Secondary | ICD-10-CM | POA: Diagnosis present

## 2024-01-20 DIAGNOSIS — R0789 Other chest pain: Secondary | ICD-10-CM | POA: Diagnosis present

## 2024-01-20 DIAGNOSIS — D869 Sarcoidosis, unspecified: Secondary | ICD-10-CM | POA: Diagnosis present

## 2024-01-20 DIAGNOSIS — Z79891 Long term (current) use of opiate analgesic: Secondary | ICD-10-CM

## 2024-01-20 DIAGNOSIS — G894 Chronic pain syndrome: Secondary | ICD-10-CM | POA: Diagnosis present

## 2024-01-20 DIAGNOSIS — M879 Osteonecrosis, unspecified: Secondary | ICD-10-CM | POA: Diagnosis present

## 2024-01-20 DIAGNOSIS — Z7952 Long term (current) use of systemic steroids: Secondary | ICD-10-CM

## 2024-01-20 DIAGNOSIS — R5381 Other malaise: Secondary | ICD-10-CM | POA: Diagnosis present

## 2024-01-20 DIAGNOSIS — I9589 Other hypotension: Secondary | ICD-10-CM | POA: Diagnosis present

## 2024-01-20 DIAGNOSIS — Z91030 Bee allergy status: Secondary | ICD-10-CM

## 2024-01-20 DIAGNOSIS — Z79890 Hormone replacement therapy (postmenopausal): Secondary | ICD-10-CM

## 2024-01-20 LAB — BASIC METABOLIC PANEL WITH GFR
Anion gap: 6 (ref 5–15)
BUN: 11 mg/dL (ref 8–23)
CO2: 34 mmol/L — ABNORMAL HIGH (ref 22–32)
Calcium: 8.5 mg/dL — ABNORMAL LOW (ref 8.9–10.3)
Chloride: 97 mmol/L — ABNORMAL LOW (ref 98–111)
Creatinine, Ser: 0.72 mg/dL (ref 0.44–1.00)
GFR, Estimated: >60 mL/min (ref >60)
Glucose, Bld: 131 mg/dL — ABNORMAL HIGH (ref 70–99)
Potassium: 3.9 mmol/L (ref 3.5–5.1)
Sodium: 137 mmol/L (ref 135–145)

## 2024-01-20 LAB — CBC WITH DIFFERENTIAL/PLATELET
Abs Immature Granulocytes: 0.25 10*3/uL — ABNORMAL HIGH (ref 0.00–0.07)
Basophils Absolute: 0.1 10*3/uL (ref 0.0–0.1)
Basophils Relative: 1 %
Eosinophils Absolute: 0.2 10*3/uL (ref 0.0–0.5)
Eosinophils Relative: 2 %
HCT: 30.3 % — ABNORMAL LOW (ref 36.0–46.0)
Hemoglobin: 9.1 g/dL — ABNORMAL LOW (ref 12.0–15.0)
Immature Granulocytes: 3 %
Lymphocytes Relative: 17 %
Lymphs Abs: 1.2 10*3/uL (ref 0.7–4.0)
MCH: 29.2 pg (ref 26.0–34.0)
MCHC: 30 g/dL (ref 30.0–36.0)
MCV: 97.1 fL (ref 80.0–100.0)
Monocytes Absolute: 1 10*3/uL (ref 0.1–1.0)
Monocytes Relative: 14 %
Neutro Abs: 4.6 10*3/uL (ref 1.7–7.7)
Neutrophils Relative %: 63 %
Platelets: 407 10*3/uL — ABNORMAL HIGH (ref 150–400)
RBC: 3.12 MIL/uL — ABNORMAL LOW (ref 3.87–5.11)
RDW: 15.5 % (ref 11.5–15.5)
WBC: 7.3 10*3/uL (ref 4.0–10.5)
nRBC: 1 % — ABNORMAL HIGH (ref 0.0–0.2)

## 2024-01-20 LAB — TROPONIN I (HIGH SENSITIVITY): Troponin I (High Sensitivity): 6 ng/L (ref <18)

## 2024-01-20 MED ORDER — HYDROMORPHONE HCL 1 MG/ML IJ SOLN
1.0000 mg | Freq: Once | INTRAMUSCULAR | Status: AC
  Administered 2024-01-20: 1 mg via INTRAVENOUS
  Filled 2024-01-20: qty 1

## 2024-01-20 NOTE — ED Triage Notes (Signed)
 Pt bib [facility] from home, c/o back and chest pain x1 3 days Recent compression fracture. Is not wearing her back brace at this time.  Pt is on 5L [place] chronically

## 2024-01-20 NOTE — ED Provider Notes (Signed)
 [facility] AT [facility]  Provider Note  CSN: [id number] Arrival date & time: 01/20/24 2245  History Chief Complaint  Patient presents with   Back Pain    Arbor R Fyfe is a 63 y.o. female with history of sarcoidosis and chronic back pain from recent compression [name], [name], Kathleen had another injury while there and is now living at home. Her home pain regimen is oxycodone /APAP 10mg /325mg . She reports pain has not been controlled the last few days and tonight she began having sharp anterior chest pain. She called EMS to bring her in for pain control.    Home Medications Prior to Admission medications   Medication Sig Start Date End Date Taking? Authorizing Provider  acetaminophen  (TYLENOL ) 500 MG tablet Take 2 tablets (1,000 mg total) by mouth 3 (three) times daily. 01/08/24   Mason Sole, Pratik D, DO  albuterol  (PROVENTIL ) (2.5 MG/3ML) 0.083% nebulizer solution Take 3 mLs (2.5 mg total) by nebulization every 4 (four) hours as needed for wheezing or shortness of breath. 12/15/22 01/03/24  Colin Dawley, MD  albuterol  (PROVENTIL ) (2.5 MG/3ML) 0.083% nebulizer solution Inhale 3 mL (2.5 mg total) by nebulization every four (4) hours as needed for wheezing or shortness of breath. 01/08/24   Mason Sole, Pratik D, DO  bisacodyl  (DULCOLAX) 10 MG suppository Place 1 suppository (10 mg total) rectally daily as needed for severe constipation. 01/08/24   Mason Sole, Pratik D, DO  budesonide  (PULMICORT ) 0.5 MG/2ML nebulizer solution Inhale the contents of 1 vial via nebulizer two (2) times a day. 11/22/21     busPIRone  (BUSPAR ) 15 MG tablet Take 1 tablet (15 mg) by mouth 3 times daily. 10/11/23   Bennet Brasil, MD  calcitonin, salmon, (MIACALCIN Marciano Settles) 200 UNIT/ACT nasal spray Use 1 spray in one nostril per day, alternate nostrils every other day (may have 1 vial she should use this for at least 2 [name] Kathleen may use as long as 4 [name]) 01/10/24   Luking, Jackelyn Marvel, MD   docusate sodium  (COLACE) 100 MG capsule Take 1 capsule (100 mg total) by mouth 2 (two) times daily. 01/08/24   Mason Sole, Pratik D, DO  DULoxetine  (CYMBALTA ) 60 MG capsule Take 1 capsule (60 mg total) by mouth in the morning. 12/31/23     EPINEPHrine  0.3 mg/0.3 mL IJ SOAJ injection Inject 0.3 mg into the muscle as needed for anaphylaxis. 01/05/22   Bennet Brasil, MD  ferrous sulfate  325 (65 FE) MG EC tablet Take 325 mg by mouth once a week.    [provider]  furosemide  (LASIX ) 20 MG tablet Take 1 tablet (20 mg total) by mouth every Monday, Wednesday, and Friday. 07/17/23   Bennet Brasil, MD  hydrocortisone  (CORTEF ) 10 MG tablet Take 2 tablets by mouth at 8:00 am and 1 tablet at lunch. (May double dose on sick days x 3-5 days) 11/14/23   Nida, Gebreselassie W, MD  hydrOXYzine  (ATARAX ) 25 MG tablet Take 1 tablet (25 mg total) by mouth 3 (three) times daily as needed for anxiety. 12/02/23 12/01/24  Bennet Brasil, MD  ipratropium-albuterol  (DUONEB) 0.5-2.5 (3) MG/3ML SOLN Take 3 mLs by nebulization every 8 (eight) hours. 12/17/22     lactulose  (CHRONULAC ) 10 GM/15ML solution Take 15 mLs (10 g total) by mouth every 6 (six) hours as needed for mild constipation. 01/08/24   Mason Sole, Pratik D, DO  levothyroxine  (SYNTHROID ) 50 MCG tablet Take 1 tablet by mouth daily before breakfast. 11/22/23 05/20/24  Nida, Gebreselassie W, MD  lidocaine  (LIDODERM ) 5 % Place 1 patch onto the skin daily. Remove & Discard patch within 12 hours or as directed by MD 04/09/23   Doreene Gammon D, DO  linaclotide  (LINZESS ) 290 MCG CAPS capsule Take 1 capsule (290 mcg total) by mouth daily before breakfast. 03/28/22   Bennet Brasil, MD  methocarbamol  (ROBAXIN ) 750 MG tablet Take 1 tablet (750 mg total) by mouth every 8 (eight) hours as needed for muscle spasms. 01/08/24   Mason Sole, Pratik D, DO  methylPREDNISolone  sodium succinate  (SOLU-MEDROL , PF,) 125 MG injection ACT-O-VIAL Administer as directed.  Giving IV is preferred, Kathleen can also be  given IM if IV access is difficult. 01/13/24   Nida, Gebreselassie W, MD  midodrine  (PROAMATINE ) 5 MG tablet Take 1 tablet (5 mg total) by mouth 2 (two) times daily with a meal. 06/19/23 06/18/24  Bennet Brasil, MD  ondansetron  (ZOFRAN -ODT) 8 MG disintegrating tablet Dissolve 1 tablet (8 mg total) by mouth every 8 (eight) hours as needed. Patient taking differently: Take 8 mg by mouth every 8 (eight) hours as needed for nausea or vomiting. 04/23/23 04/22/24  Bennet Brasil, MD  oxyCODONE  (ROXICODONE ) 15 MG immediate release tablet Take 1 tablet (15 mg total) by mouth every 4 (four) hours as needed for moderate pain (pain score 4-6) or severe pain (pain score 7-10). 01/08/24   Mason Sole, Pratik D, DO  OXYGEN  Inhale 5 L into the lungs continuous.    [provider]  pantoprazole  (PROTONIX ) 40 MG tablet Take 1 tablet (40 mg total) by mouth 2 (two) times daily before a meal. 08/13/23 08/12/24  Bennet Brasil, MD  polyethylene glycol (MIRALAX  / GLYCOLAX ) 17 g packet Take 17 g by mouth 2 (two) times daily. 01/08/24   Mason Sole, Pratik D, DO  predniSONE  (DELTASONE ) 5 MG tablet Take 1 tablet (5 mg total) by mouth in the morning. 08/01/23     rosuvastatin  (CRESTOR ) 20 MG tablet Take 1 tablet (20 mg total) by mouth daily. 08/12/23   Bennet Brasil, MD  tamsulosin  (FLOMAX ) 0.4 MG CAPS capsule Take 1 capsule (0.4 mg total) by mouth daily after supper. 01/10/24   Bennet Brasil, MD     Allergies    Bee venom, Contrast media [iodinated contrast media], Morphine , Bactrim  [sulfamethoxazole -trimethoprim ], and Codeine   Review of Systems   Review of Systems Please see HPI for pertinent positives and negatives  Physical Exam BP (!) 123/48   Pulse 91   Temp 98.3 F (36.8 C)   Resp 12   LMP 05/17/2016   SpO2 96%   Physical Exam Vitals and nursing note reviewed.  Constitutional:      Appearance: Normal appearance. She is obese.  HENT:     Head: Normocephalic and atraumatic.     Nose: Nose normal.      Mouth/Throat:     Mouth: Mucous membranes are moist.  Eyes:     Extraocular Movements: Extraocular movements intact.     Conjunctiva/sclera: Conjunctivae normal.  Cardiovascular:     Rate and Rhythm: Normal rate.  Pulmonary:     Effort: Pulmonary effort is normal.     Breath sounds: Normal breath sounds.  Chest:     Chest wall: No tenderness.  Abdominal:     General: Abdomen is flat.     Palpations: Abdomen is soft.     Tenderness: There is no abdominal tenderness.  Musculoskeletal:        General: Tenderness (diffuse lumbar back)  present. No swelling. Normal range of motion.     Cervical back: Neck supple.  Skin:    General: Skin is warm and dry.  Neurological:     General: No focal deficit present.     Mental Status: She is alert.  Psychiatric:        Mood and Affect: Mood normal.     ED Results / Procedures / Treatments   EKG EKG Interpretation Date/Time:  Monday Jan 20 2024 22:55:44 EDT Ventricular Rate:  90 PR Interval:  122 QRS Duration:  78 QT Interval:  415 QTC Calculation: 508 R Axis:   55  Text Interpretation: Sinus rhythm Low voltage, precordial leads Borderline T abnormalities, anterior leads Prolonged QT interval No significant change since last tracing Confirmed by Shawnee Dellen [phone]) on 01/20/2024 11:08:02 PM  Procedures Procedures  Medications Ordered in the ED Medications  HYDROmorphone  (DILAUDID ) injection 1 mg (1 mg Intravenous Given 01/20/24 2318)  HYDROmorphone  (DILAUDID ) injection 2 mg (2 mg Intravenous Given 01/21/24 0021)    Initial Impression and Plan  Patient here primarily for exacerbation of chronic back pain Kathleen also reporting some atypical chest pain. Will check labs, CXR and EKG while giving her pain medication for comfort. She denies any new falls or injuries  ED Course   Clinical Course as of 01/21/24 0148  Mon Jan 20, 2024  2322 CBC with stable anemia, otherwise unremarkable.  [CS]  2329 BMP is normal.  [CS]  2342 Trop is  normal. Given duration of symptoms and atypical nature a delta is not necessary to rule out AMI.  [CS]  2344 I personally viewed the images from radiology studies and agree with radiologist interpretation: CXR with cardiomegaly and signs of PNA vs edema. Also has known sarcoidosis so could be related. Will send for CT to help differentiate. She is not currently having any change in her usual respiratory symptoms.  [CS]  Tue Jan 21, 2024  0055 I personally viewed the images from radiology studies and agree with radiologist interpretation: CT shows chronic lung changes, no acute process. She is now reporting she thinks she has a new compression fracture although she denies any specific injuries. She wants to be admitted to the hospital for 'a few days to get better pain medication'. At this point, I do not see an indication for hospital admission, Kathleen offered CT L spine to determine if she does in fact have a new compression fracture. If not, she is agreeable to discharge with outpatient Pain Management follow up.  [CS]  0131 I personally viewed the images from radiology studies and agree with radiologist interpretation: CT shows a new L1 compression fracture. Will discuss admission with hospitalist.  [CS]  [phone] Spoke with Dr. Amy Kansky, Hospitalist, who will evaluate for admission.  [CS]    Clinical Course User Index [CS] Charmayne Cooper, MD     MDM Rules/Calculators/A&P Medical Decision Making Problems Addressed: Atypical chest pain: acute illness or injury Chronic low back pain without sciatica, unspecified back pain laterality: chronic illness or injury with exacerbation, progression, or side effects of treatment Compression fracture of L1 vertebra, initial encounter [facility]): acute illness or injury  Amount and/or Complexity of Data Reviewed Labs: ordered. Decision-making details documented in ED Course. Radiology: ordered and independent interpretation performed. Decision-making details  documented in ED Course. ECG/medicine tests: ordered and independent interpretation performed. Decision-making details documented in ED Course.  Risk Prescription drug management. Parenteral controlled substances. Decision regarding hospitalization.     Final Clinical Impression(s) /  ED Diagnoses Final diagnoses:  Compression fracture of L1 vertebra, initial encounter (HCC)  Chronic low back pain without sciatica, unspecified back pain laterality  Atypical chest pain    Rx / DC Orders ED Discharge Orders     None        Charmayne Cooper, MD 01/21/24 [phone]

## 2024-01-21 ENCOUNTER — Other Ambulatory Visit: Payer: Self-pay | Admitting: Family Medicine

## 2024-01-21 ENCOUNTER — Emergency Department (HOSPITAL_COMMUNITY)

## 2024-01-21 ENCOUNTER — Encounter (HOSPITAL_COMMUNITY): Payer: Self-pay | Admitting: Internal Medicine

## 2024-01-21 DIAGNOSIS — S32048D Other fracture of fourth lumbar vertebra, subsequent encounter for fracture with routine healing: Secondary | ICD-10-CM | POA: Diagnosis not present

## 2024-01-21 DIAGNOSIS — Z6841 Body Mass Index (BMI) 40.0 and over, adult: Secondary | ICD-10-CM | POA: Diagnosis not present

## 2024-01-21 DIAGNOSIS — R0789 Other chest pain: Secondary | ICD-10-CM | POA: Diagnosis not present

## 2024-01-21 DIAGNOSIS — Z91199 Patient's noncompliance with other medical treatment and regimen due to unspecified reason: Secondary | ICD-10-CM | POA: Diagnosis not present

## 2024-01-21 DIAGNOSIS — R1310 Dysphagia, unspecified: Secondary | ICD-10-CM | POA: Diagnosis not present

## 2024-01-21 DIAGNOSIS — J479 Bronchiectasis, uncomplicated: Secondary | ICD-10-CM | POA: Diagnosis not present

## 2024-01-21 DIAGNOSIS — M8008XA Age-related osteoporosis with current pathological fracture, vertebra(e), initial encounter for fracture: Secondary | ICD-10-CM | POA: Diagnosis not present

## 2024-01-21 DIAGNOSIS — S32040A Wedge compression fracture of fourth lumbar vertebra, initial encounter for closed fracture: Secondary | ICD-10-CM | POA: Diagnosis not present

## 2024-01-21 DIAGNOSIS — S32010A Wedge compression fracture of first lumbar vertebra, initial encounter for closed fracture: Secondary | ICD-10-CM

## 2024-01-21 DIAGNOSIS — F419 Anxiety disorder, unspecified: Secondary | ICD-10-CM | POA: Diagnosis not present

## 2024-01-21 DIAGNOSIS — F32A Depression, unspecified: Secondary | ICD-10-CM | POA: Diagnosis not present

## 2024-01-21 DIAGNOSIS — E039 Hypothyroidism, unspecified: Secondary | ICD-10-CM | POA: Diagnosis not present

## 2024-01-21 DIAGNOSIS — J90 Pleural effusion, not elsewhere classified: Secondary | ICD-10-CM | POA: Diagnosis not present

## 2024-01-21 DIAGNOSIS — R29890 Loss of height: Secondary | ICD-10-CM | POA: Diagnosis not present

## 2024-01-21 DIAGNOSIS — K8689 Other specified diseases of pancreas: Secondary | ICD-10-CM | POA: Diagnosis not present

## 2024-01-21 DIAGNOSIS — R0989 Other specified symptoms and signs involving the circulatory and respiratory systems: Secondary | ICD-10-CM | POA: Diagnosis not present

## 2024-01-21 DIAGNOSIS — I700 Atherosclerosis of aorta: Secondary | ICD-10-CM | POA: Diagnosis not present

## 2024-01-21 DIAGNOSIS — Z79899 Other long term (current) drug therapy: Secondary | ICD-10-CM | POA: Diagnosis not present

## 2024-01-21 DIAGNOSIS — G894 Chronic pain syndrome: Secondary | ICD-10-CM | POA: Diagnosis not present

## 2024-01-21 DIAGNOSIS — J811 Chronic pulmonary edema: Secondary | ICD-10-CM | POA: Diagnosis not present

## 2024-01-21 DIAGNOSIS — Z1152 Encounter for screening for COVID-19: Secondary | ICD-10-CM | POA: Diagnosis not present

## 2024-01-21 DIAGNOSIS — S32000A Wedge compression fracture of unspecified lumbar vertebra, initial encounter for closed fracture: Secondary | ICD-10-CM | POA: Diagnosis present

## 2024-01-21 DIAGNOSIS — E785 Hyperlipidemia, unspecified: Secondary | ICD-10-CM | POA: Diagnosis not present

## 2024-01-21 DIAGNOSIS — S32030A Wedge compression fracture of third lumbar vertebra, initial encounter for closed fracture: Secondary | ICD-10-CM | POA: Diagnosis not present

## 2024-01-21 DIAGNOSIS — M797 Fibromyalgia: Secondary | ICD-10-CM | POA: Diagnosis not present

## 2024-01-21 DIAGNOSIS — D869 Sarcoidosis, unspecified: Secondary | ICD-10-CM | POA: Diagnosis not present

## 2024-01-21 DIAGNOSIS — Z79890 Hormone replacement therapy (postmenopausal): Secondary | ICD-10-CM | POA: Diagnosis not present

## 2024-01-21 DIAGNOSIS — K219 Gastro-esophageal reflux disease without esophagitis: Secondary | ICD-10-CM | POA: Diagnosis not present

## 2024-01-21 DIAGNOSIS — E271 Primary adrenocortical insufficiency: Secondary | ICD-10-CM | POA: Diagnosis not present

## 2024-01-21 DIAGNOSIS — F321 Major depressive disorder, single episode, moderate: Secondary | ICD-10-CM | POA: Diagnosis not present

## 2024-01-21 DIAGNOSIS — D649 Anemia, unspecified: Secondary | ICD-10-CM | POA: Diagnosis not present

## 2024-01-21 DIAGNOSIS — Z7952 Long term (current) use of systemic steroids: Secondary | ICD-10-CM | POA: Diagnosis not present

## 2024-01-21 DIAGNOSIS — J849 Interstitial pulmonary disease, unspecified: Secondary | ICD-10-CM | POA: Diagnosis not present

## 2024-01-21 DIAGNOSIS — J9611 Chronic respiratory failure with hypoxia: Secondary | ICD-10-CM | POA: Diagnosis not present

## 2024-01-21 DIAGNOSIS — E559 Vitamin D deficiency, unspecified: Secondary | ICD-10-CM | POA: Diagnosis not present

## 2024-01-21 DIAGNOSIS — R918 Other nonspecific abnormal finding of lung field: Secondary | ICD-10-CM | POA: Diagnosis not present

## 2024-01-21 DIAGNOSIS — M545 Low back pain, unspecified: Secondary | ICD-10-CM | POA: Diagnosis not present

## 2024-01-21 DIAGNOSIS — Z8249 Family history of ischemic heart disease and other diseases of the circulatory system: Secondary | ICD-10-CM | POA: Diagnosis not present

## 2024-01-21 DIAGNOSIS — M879 Osteonecrosis, unspecified: Secondary | ICD-10-CM | POA: Diagnosis not present

## 2024-01-21 LAB — HIV ANTIBODY (ROUTINE TESTING W REFLEX): HIV Screen 4th Generation wRfx: NONREACTIVE

## 2024-01-21 MED ORDER — ALBUTEROL SULFATE (2.5 MG/3ML) 0.083% IN NEBU: 2.5000 mg | INHALATION_SOLUTION | RESPIRATORY_TRACT | Status: DC | PRN

## 2024-01-21 MED ORDER — HYDROMORPHONE HCL 1 MG/ML IJ SOLN
1.0000 mg | INTRAMUSCULAR | Status: DC | PRN
  Administered 2024-01-21: 1 mg via INTRAVENOUS
  Filled 2024-01-21: qty 1

## 2024-01-21 MED ORDER — BISACODYL 10 MG RE SUPP: 10.0000 mg | Freq: Every day | RECTAL | Status: DC | PRN

## 2024-01-21 MED ORDER — ALUM & MAG HYDROXIDE-SIMETH 200-200-20 MG/5ML PO SUSP
30.0000 mL | ORAL | Status: DC | PRN
  Administered 2024-01-21: 30 mL via ORAL
  Filled 2024-01-21: qty 30

## 2024-01-21 MED ORDER — BUSPIRONE HCL 5 MG PO TABS
15.0000 mg | ORAL_TABLET | Freq: Three times a day (TID) | ORAL | Status: DC
  Administered 2024-01-21 – 2024-01-23 (×7): 15 mg via ORAL
  Filled 2024-01-21 (×7): qty 3

## 2024-01-21 MED ORDER — HYDROMORPHONE HCL 1 MG/ML IJ SOLN
2.0000 mg | INTRAMUSCULAR | Status: DC | PRN
  Administered 2024-01-21 – 2024-01-22 (×4): 2 mg via INTRAVENOUS
  Filled 2024-01-21 (×5): qty 2

## 2024-01-21 MED ORDER — HYDROMORPHONE HCL 1 MG/ML IJ SOLN
2.0000 mg | Freq: Once | INTRAMUSCULAR | Status: AC
  Administered 2024-01-21: 2 mg via INTRAVENOUS
  Filled 2024-01-21: qty 2

## 2024-01-21 MED ORDER — DULOXETINE HCL 60 MG PO CPEP
60.0000 mg | ORAL_CAPSULE | Freq: Every day | ORAL | Status: DC
  Administered 2024-01-21 – 2024-01-23 (×3): 60 mg via ORAL
  Filled 2024-01-21 (×3): qty 1

## 2024-01-21 MED ORDER — OXYCODONE HCL 5 MG PO TABS
20.0000 mg | ORAL_TABLET | ORAL | Status: DC | PRN
  Administered 2024-01-21 – 2024-01-23 (×9): 20 mg via ORAL
  Filled 2024-01-21 (×11): qty 4

## 2024-01-21 MED ORDER — FUROSEMIDE 20 MG PO TABS
20.0000 mg | ORAL_TABLET | ORAL | Status: DC
  Administered 2024-01-22: 20 mg via ORAL
  Filled 2024-01-21: qty 1

## 2024-01-21 MED ORDER — ACETAMINOPHEN 500 MG PO TABS
1000.0000 mg | ORAL_TABLET | Freq: Four times a day (QID) | ORAL | Status: DC | PRN
  Administered 2024-01-21 – 2024-01-22 (×2): 1000 mg via ORAL
  Filled 2024-01-21 (×2): qty 2

## 2024-01-21 MED ORDER — CHLORHEXIDINE GLUCONATE CLOTH 2 % EX PADS
6.0000 | MEDICATED_PAD | Freq: Every day | CUTANEOUS | Status: DC
  Administered 2024-01-21 – 2024-01-23 (×3): 6 via TOPICAL

## 2024-01-21 MED ORDER — ROSUVASTATIN CALCIUM 20 MG PO TABS
20.0000 mg | ORAL_TABLET | Freq: Every day | ORAL | Status: DC
  Administered 2024-01-21 – 2024-01-23 (×3): 20 mg via ORAL
  Filled 2024-01-21 (×3): qty 1

## 2024-01-21 MED ORDER — ENOXAPARIN SODIUM 40 MG/0.4ML IJ SOSY
40.0000 mg | PREFILLED_SYRINGE | INTRAMUSCULAR | Status: DC
  Administered 2024-01-21 – 2024-01-23 (×3): 40 mg via SUBCUTANEOUS
  Filled 2024-01-21 (×3): qty 0.4

## 2024-01-21 MED ORDER — IPRATROPIUM-ALBUTEROL 0.5-2.5 (3) MG/3ML IN SOLN
3.0000 mL | Freq: Four times a day (QID) | RESPIRATORY_TRACT | Status: DC | PRN
  Administered 2024-01-21 – 2024-01-23 (×3): 3 mL via RESPIRATORY_TRACT
  Filled 2024-01-21 (×2): qty 3

## 2024-01-21 MED ORDER — POLYETHYLENE GLYCOL 3350 17 G PO PACK
17.0000 g | PACK | Freq: Two times a day (BID) | ORAL | Status: DC
  Administered 2024-01-21 – 2024-01-23 (×5): 17 g via ORAL
  Filled 2024-01-21 (×5): qty 1

## 2024-01-21 MED ORDER — BUDESONIDE 0.5 MG/2ML IN SUSP
0.5000 mg | Freq: Every day | RESPIRATORY_TRACT | Status: DC
  Administered 2024-01-21 – 2024-01-23 (×3): 0.5 mg via RESPIRATORY_TRACT
  Filled 2024-01-21 (×3): qty 2

## 2024-01-21 MED ORDER — PREDNISONE 5 MG PO TABS
5.0000 mg | ORAL_TABLET | Freq: Every morning | ORAL | Status: DC
  Administered 2024-01-21 – 2024-01-23 (×3): 5 mg via ORAL
  Filled 2024-01-21 (×3): qty 1

## 2024-01-21 MED ORDER — HYDROCORTISONE 20 MG PO TABS
20.0000 mg | ORAL_TABLET | Freq: Every day | ORAL | Status: DC
  Administered 2024-01-21 – 2024-01-23 (×3): 20 mg via ORAL
  Filled 2024-01-21 (×4): qty 1

## 2024-01-21 MED ORDER — MELATONIN 3 MG PO TABS
6.0000 mg | ORAL_TABLET | Freq: Every evening | ORAL | Status: DC | PRN
  Administered 2024-01-21 – 2024-01-22 (×2): 6 mg via ORAL
  Filled 2024-01-21 (×2): qty 2

## 2024-01-21 MED ORDER — OXYCODONE HCL 5 MG PO TABS
15.0000 mg | ORAL_TABLET | ORAL | Status: DC | PRN
  Administered 2024-01-21: 15 mg via ORAL
  Filled 2024-01-21: qty 3

## 2024-01-21 MED ORDER — MIDODRINE HCL 5 MG PO TABS
5.0000 mg | ORAL_TABLET | Freq: Two times a day (BID) | ORAL | Status: DC
  Administered 2024-01-21 – 2024-01-23 (×5): 5 mg via ORAL
  Filled 2024-01-21 (×5): qty 1

## 2024-01-21 MED ORDER — LINACLOTIDE 145 MCG PO CAPS
290.0000 ug | ORAL_CAPSULE | Freq: Every day | ORAL | Status: DC
  Administered 2024-01-21 – 2024-01-23 (×3): 290 ug via ORAL
  Filled 2024-01-21 (×3): qty 2

## 2024-01-21 MED ORDER — TAMSULOSIN HCL 0.4 MG PO CAPS
0.4000 mg | ORAL_CAPSULE | Freq: Every day | ORAL | Status: DC
  Administered 2024-01-21 – 2024-01-22 (×2): 0.4 mg via ORAL
  Filled 2024-01-21 (×2): qty 1

## 2024-01-21 MED ORDER — FUROSEMIDE 10 MG/ML IJ SOLN
40.0000 mg | Freq: Once | INTRAMUSCULAR | Status: AC
  Administered 2024-01-21: 40 mg via INTRAVENOUS
  Filled 2024-01-21: qty 4

## 2024-01-21 MED ORDER — HYDROCORTISONE 10 MG PO TABS
10.0000 mg | ORAL_TABLET | ORAL | Status: DC
  Administered 2024-01-21 – 2024-01-22 (×2): 10 mg via ORAL
  Filled 2024-01-21 (×4): qty 1

## 2024-01-21 MED ORDER — POLYETHYLENE GLYCOL 3350 17 G PO PACK: 17.0000 g | PACK | Freq: Every day | ORAL | Status: DC | PRN

## 2024-01-21 MED ORDER — DICLOFENAC SODIUM 1 % EX GEL
4.0000 g | Freq: Four times a day (QID) | CUTANEOUS | Status: DC
  Administered 2024-01-21 – 2024-01-23 (×9): 4 g via TOPICAL
  Filled 2024-01-21 (×2): qty 100

## 2024-01-21 MED ORDER — LIDOCAINE 5 % EX PTCH
1.0000 | MEDICATED_PATCH | CUTANEOUS | Status: DC
  Administered 2024-01-21 – 2024-01-23 (×3): 1 via TRANSDERMAL
  Filled 2024-01-21 (×3): qty 1

## 2024-01-21 MED ORDER — LEVOTHYROXINE SODIUM 50 MCG PO TABS
50.0000 ug | ORAL_TABLET | Freq: Every day | ORAL | Status: DC
  Administered 2024-01-21 – 2024-01-23 (×3): 50 ug via ORAL
  Filled 2024-01-21 (×3): qty 1

## 2024-01-21 MED ORDER — METHOCARBAMOL 500 MG PO TABS
750.0000 mg | ORAL_TABLET | Freq: Three times a day (TID) | ORAL | Status: DC | PRN
  Administered 2024-01-22: 750 mg via ORAL
  Filled 2024-01-21: qty 2

## 2024-01-21 MED ORDER — HYDROXYZINE HCL 25 MG PO TABS
25.0000 mg | ORAL_TABLET | Freq: Three times a day (TID) | ORAL | Status: DC | PRN
  Administered 2024-01-21 – 2024-01-23 (×5): 25 mg via ORAL
  Filled 2024-01-21 (×6): qty 1

## 2024-01-21 MED ORDER — MILK AND MOLASSES ENEMA
1.0000 | Freq: Once | RECTAL | Status: AC
  Administered 2024-01-21: 240 mL via RECTAL

## 2024-01-21 MED ORDER — VITAMIN D 25 MCG (1000 UNIT) PO TABS
1000.0000 [IU] | ORAL_TABLET | Freq: Every day | ORAL | Status: DC
  Administered 2024-01-21 – 2024-01-23 (×3): 1000 [IU] via ORAL
  Filled 2024-01-21 (×3): qty 1

## 2024-01-21 MED ORDER — PANTOPRAZOLE SODIUM 40 MG PO TBEC
40.0000 mg | DELAYED_RELEASE_TABLET | Freq: Two times a day (BID) | ORAL | Status: DC
  Administered 2024-01-21 – 2024-01-23 (×5): 40 mg via ORAL
  Filled 2024-01-21 (×5): qty 1

## 2024-01-21 MED ORDER — FERROUS SULFATE 325 (65 FE) MG PO TABS
325.0000 mg | ORAL_TABLET | ORAL | Status: DC
  Administered 2024-01-21: 325 mg via ORAL
  Filled 2024-01-21: qty 1

## 2024-01-21 NOTE — Plan of Care (Signed)
  Problem: Acute Rehab PT Goals(only PT should resolve) Goal: Pt Will Go Supine/Side To Sit Outcome: Progressing Flowsheets (Taken 01/21/2024 1431) Pt will go Supine/Side to Sit: with contact guard assist Goal: Patient Will Perform Sitting Balance Outcome: Progressing Flowsheets (Taken 01/21/2024 1431) Patient will perform sitting balance: with contact guard assist Goal: Pt Will Transfer Bed To Chair/Chair To Bed Outcome: Progressing Flowsheets (Taken 01/21/2024 1431) Pt will Transfer Bed to Chair/Chair to Bed: with contact guard assist Goal: Pt Will Ambulate Outcome: Progressing Flowsheets (Taken 01/21/2024 1431) Pt will Ambulate:  25 feet  with contact guard assist  with minimal assist  with rolling walker   2:32 PM, 01/21/24 Walton Guppy, MPT Physical Therapist with [facility] 336 [phone] office [phone] mobile phone

## 2024-01-21 NOTE — Progress Notes (Deleted)
 Mobility Specialist Progress Note:    01/21/24 1435  Mobility  Activity Transferred to/from BSC;Stood at bedside  Level of Assistance Moderate assist, patient does 50-74%  Assistive Device Front wheel walker  Distance Ambulated (ft) 2 ft  Range of Motion/Exercises Active;All extremities  Activity Response Tolerated well  Mobility visit 1 Mobility  Mobility Specialist Start Time (ACUTE ONLY) 1425  Mobility Specialist Stop Time (ACUTE ONLY) 1435  Mobility Specialist Time Calculation (min) (ACUTE ONLY) 10 min   Pt received asking for help. Required ModA to stand and transfer with RW. Tolerated well, c/o back pain. Returned supine, all needs met.   Glinda Lapping Mobility Specialist Please contact via Special educational needs teacher or  Rehab office at [phone]

## 2024-01-21 NOTE — Progress Notes (Signed)
 Mobility Specialist Progress Note:    01/21/24 1435  Therapy Vitals  Pulse Rate (!) 102  BP (!) 151/78  Patient Position (if appropriate) Sitting  Oxygen  Therapy  SpO2 100 %  O2 Device Nasal Cannula  O2 Flow Rate (L/min) 5 L/min  Mobility  Activity Transferred to/from BSC;Stood at bedside  Level of Assistance Moderate assist, patient does 50-74%  Assistive Device Front wheel walker  Distance Ambulated (ft) 2 ft  Range of Motion/Exercises Active;All extremities  Activity Response Tolerated well  Mobility visit 1 Mobility  Mobility Specialist Start Time (ACUTE ONLY) 1425  Mobility Specialist Stop Time (ACUTE ONLY) 1435  Mobility Specialist Time Calculation (min) (ACUTE ONLY) 10 min   Pt received asking for help. Required ModA to stand and transfer with RW. Tolerated well, stated "I feel like I am going to pass out". VSS. Returned supine, notified RN. All needs met.  Glinda Lapping Mobility Specialist Please contact via Special educational needs teacher or  Rehab office at [phone]

## 2024-01-21 NOTE — ED Notes (Signed)
 Patient transported to CT

## 2024-01-21 NOTE — H&P (Signed)
 History and Physical    AHUVA POYNOR ZOX:[id number] DOB: 1961-07-28 DOA: 01/20/2024  PCP: Bennet Brasil, MD   Patient coming from: Home   Chief Complaint:  Chief Complaint  Patient presents with   Back Pain    HPI:  Kathleen [name] is a 63 y.o. female with hx of chronic respiratory failure on 5 L, interstitial lung disease, sarcoidosis, Addison's disease, hypothyroidism, HLD, morbid obesity, osteoporosis with vertebral compression fractures, AVN hips, urinary retention, chronic pain syndrome, chronic anemia, anxiety and depression, who presents with acute worsening of lower back pain. Reports she has been nonambulatory for about the past month (since her last SNF stay). About 3 days ago was just moving her legs and felt a pop in her back with acute worsening of pain. Pain is severe with any movement and she is screaming out in pain intermittently during interview. She reports having feeling like her leg is going to sleep sometimes, but does not seem that she has true numbness or weakness in LE, or incontinence. Pain uncontrolled despite her home oxycodone . Follows with Dr. [name] of [facility] and will need clearance by pulmonology to be considered for kyphoplasty.    Review of Systems:  ROS complete and negative except as marked above   Allergies  Allergen Reactions   Bee Venom Anaphylaxis   Contrast Media [Iodinated Contrast Media] Other (See Comments)    CT chest w/ contrast at OSH followed by SOB resolved w/ single dose steroids, never ENT swelling, intubation or pressors, has had multiple contrasted scans since   Morphine  Other (See Comments)    Substance with morphinan structure and opioid receptor agonist mechanism of action (substance)   Bactrim  [Sulfamethoxazole -Trimethoprim ] Nausea And Vomiting   Codeine Itching    Prior to Admission medications   Medication Sig Start Date End Date Taking? Authorizing Provider  acetaminophen  (TYLENOL ) 500 MG tablet Take 2  tablets (1,000 mg total) by mouth 3 (three) times daily. 01/08/24   Mason Sole, Pratik D, DO  albuterol  (PROVENTIL ) (2.5 MG/3ML) 0.083% nebulizer solution Take 3 mLs (2.5 mg total) by nebulization every 4 (four) hours as needed for wheezing or shortness of breath. 12/15/22 01/03/24  Colin Dawley, MD  albuterol  (PROVENTIL ) (2.5 MG/3ML) 0.083% nebulizer solution Inhale 3 mL (2.5 mg total) by nebulization every four (4) hours as needed for wheezing or shortness of breath. 01/08/24   Mason Sole, Pratik D, DO  bisacodyl  (DULCOLAX) 10 MG suppository Place 1 suppository (10 mg total) rectally daily as needed for severe constipation. 01/08/24   Mason Sole, Pratik D, DO  budesonide  (PULMICORT ) 0.5 MG/2ML nebulizer solution Inhale the contents of 1 vial via nebulizer two (2) times a day. 11/22/21     busPIRone  (BUSPAR ) 15 MG tablet Take 1 tablet (15 mg) by mouth 3 times daily. 10/11/23   Bennet Brasil, MD  calcitonin, salmon, (MIACALCIN Marciano Settles) 200 UNIT/ACT nasal spray Use 1 spray in one nostril per day, alternate nostrils every other day (may have 1 vial she should use this for at least 2 weeks but may use as long as 4 weeks) 01/10/24   Luking, Jackelyn Marvel, MD  docusate sodium  (COLACE) 100 MG capsule Take 1 capsule (100 mg total) by mouth 2 (two) times daily. 01/08/24   Mason Sole, Pratik D, DO  DULoxetine  (CYMBALTA ) 60 MG capsule Take 1 capsule (60 mg total) by mouth in the morning. 12/31/23     EPINEPHrine  0.3 mg/0.3 mL IJ SOAJ injection Inject 0.3 mg into the muscle as needed for anaphylaxis.  01/05/22   Bennet Brasil, MD  ferrous sulfate  325 (65 FE) MG EC tablet Take 325 mg by mouth once a week.    [provider]  furosemide  (LASIX ) 20 MG tablet Take 1 tablet (20 mg total) by mouth every Monday, Wednesday, and Friday. 07/17/23   Bennet Brasil, MD  hydrocortisone  (CORTEF ) 10 MG tablet Take 2 tablets by mouth at 8:00 am and 1 tablet at lunch. (May double dose on sick days x 3-5 days) 11/14/23   Nida, Gebreselassie W, MD   hydrOXYzine  (ATARAX ) 25 MG tablet Take 1 tablet (25 mg total) by mouth 3 (three) times daily as needed for anxiety. 12/02/23 12/01/24  Bennet Brasil, MD  ipratropium-albuterol  (DUONEB) 0.5-2.5 (3) MG/3ML SOLN Take 3 mLs by nebulization every 8 (eight) hours. 12/17/22     lactulose  (CHRONULAC ) 10 GM/15ML solution Take 15 mLs (10 g total) by mouth every 6 (six) hours as needed for mild constipation. 01/08/24   Mason Sole, Pratik D, DO  levothyroxine  (SYNTHROID ) 50 MCG tablet Take 1 tablet by mouth daily before breakfast. 11/22/23 05/20/24  Nida, Gebreselassie W, MD  lidocaine  (LIDODERM ) 5 % Place 1 patch onto the skin daily. Remove & Discard patch within 12 hours or as directed by MD 04/09/23   Mason Sole, Pratik D, DO  linaclotide  (LINZESS ) 290 MCG CAPS capsule Take 1 capsule (290 mcg total) by mouth daily before breakfast. 03/28/22   Bennet Brasil, MD  methocarbamol  (ROBAXIN ) 750 MG tablet Take 1 tablet (750 mg total) by mouth every 8 (eight) hours as needed for muscle spasms. 01/08/24   Mason Sole, Pratik D, DO  methylPREDNISolone  sodium succinate  (SOLU-MEDROL , PF,) 125 MG injection ACT-O-VIAL Administer as directed.  Giving IV is preferred, but can also be given IM if IV access is difficult. 01/13/24   Nida, Gebreselassie W, MD  midodrine  (PROAMATINE ) 5 MG tablet Take 1 tablet (5 mg total) by mouth 2 (two) times daily with a meal. 06/19/23 06/18/24  Bennet Brasil, MD  ondansetron  (ZOFRAN -ODT) 8 MG disintegrating tablet Dissolve 1 tablet (8 mg total) by mouth every 8 (eight) hours as needed. Patient taking differently: Take 8 mg by mouth every 8 (eight) hours as needed for nausea or vomiting. 04/23/23 04/22/24  Bennet Brasil, MD  oxyCODONE  (ROXICODONE ) 15 MG immediate release tablet Take 1 tablet (15 mg total) by mouth every 4 (four) hours as needed for moderate pain (pain score 4-6) or severe pain (pain score 7-10). 01/08/24   Mason Sole, Pratik D, DO  OXYGEN  Inhale 5 L into the lungs continuous.    [provider]   pantoprazole  (PROTONIX ) 40 MG tablet Take 1 tablet (40 mg total) by mouth 2 (two) times daily before a meal. 08/13/23 08/12/24  Bennet Brasil, MD  polyethylene glycol (MIRALAX  / GLYCOLAX ) 17 g packet Take 17 g by mouth 2 (two) times daily. 01/08/24   Mason Sole, Pratik D, DO  predniSONE  (DELTASONE ) 5 MG tablet Take 1 tablet (5 mg total) by mouth in the morning. 08/01/23     rosuvastatin  (CRESTOR ) 20 MG tablet Take 1 tablet (20 mg total) by mouth daily. 08/12/23   Bennet Brasil, MD  tamsulosin  (FLOMAX ) 0.4 MG CAPS capsule Take 1 capsule (0.4 mg total) by mouth daily after supper. 01/10/24   Bennet Brasil, MD    Past Medical History:  Diagnosis Date   Adrenal insufficiency (HCC)    Anemia    Anxiety    Avascular bone necrosis (HCC)    Breast fibrocystic  disorder    Chronic pain    Encounter for long-term opiate analgesic use 02/02/2020   Patient has chronic pain with femoral head avascular necrosis.  Is under pain contract   Fibromyalgia    Gastroesophageal reflux disease    Hypothyroidism    Mitral valve prolapse    Orthostatic hypotension    Peripheral neuropathy    PONV (postoperative nausea and vomiting)    Restrictive lung disease    Moderate to severe   Sarcoidosis    Biopsy proven - [facility]   Sarcoidosis    SOB (shortness of breath)    chronic    Past Surgical History:  Procedure Laterality Date   BIOPSY  10/17/2018   Procedure: BIOPSY;  Surgeon: Ruby Corporal, MD;  [facility];  Service: Endoscopy;;  duodenum, antrum, gastric body   BIOPSY  03/03/2021   Procedure: BIOPSY;  Surgeon: Ruby Corporal, MD;  [facility];  Service: Endoscopy;;   BIOPSY  09/11/2023   Procedure: BIOPSY;  Surgeon: Suzette Espy, MD;  [facility]: [facility];  Service: Endoscopy;;   BREAST LUMPECTOMY Bilateral 01/12/2011   CHOLECYSTECTOMY N/A 04/17/2021   Procedure: LAPAROSCOPIC CHOLECYSTECTOMY;  Surgeon: Alanda Allegra, MD;  Location: [facility];  Service: General;   Laterality: N/A;   COLONOSCOPY WITH PROPOFOL  N/A 01/03/2022   Procedure: COLONOSCOPY WITH PROPOFOL ;  Surgeon: Ruby Corporal, MD;  [facility]: [facility];  Service: Endoscopy;  Laterality: N/A;  220   ESOPHAGEAL DILATION N/A 03/03/2021   Procedure: ESOPHAGEAL DILATION;  Surgeon: Ruby Corporal, MD;  [facility];  Service: Endoscopy;  Laterality: N/A;   ESOPHAGOGASTRODUODENOSCOPY (EGD) WITH PROPOFOL  N/A 10/17/2018   Procedure: ESOPHAGOGASTRODUODENOSCOPY (EGD) WITH PROPOFOL ;  Surgeon: Ruby Corporal, MD;  [facility];  Service: Endoscopy;  Laterality: N/A;  2:25   ESOPHAGOGASTRODUODENOSCOPY (EGD) WITH PROPOFOL  N/A 03/03/2021   Procedure: ESOPHAGOGASTRODUODENOSCOPY (EGD) WITH PROPOFOL ;  Surgeon: Ruby Corporal, MD;  [facility];  Service: Endoscopy;  Laterality: N/A;  12:15   ESOPHAGOGASTRODUODENOSCOPY (EGD) WITH PROPOFOL  N/A 09/11/2023   Procedure: ESOPHAGOGASTRODUODENOSCOPY (EGD) WITH PROPOFOL ;  Surgeon: Suzette Espy, MD;  [facility];  Service: Endoscopy;  Laterality: N/A;  2:30 pm, asa 3, LM to see if pt can come earlier   [facility] DILATION N/A 09/11/2023   Procedure: Londa Rival DILATION;  Surgeon: Suzette Espy, MD;  [facility];  Service: Endoscopy;  Laterality: N/A;   ORIF ANKLE FRACTURE Left 03/10/2023   Procedure: OPEN REDUCTION INTERNAL FIXATION (ORIF) ANKLE FRACTURE;  Surgeon: Diedra Fowler, MD;  Location: [facility];  Service: Orthopedics;  Laterality: Left;   POLYPECTOMY  01/03/2022   Procedure: POLYPECTOMY INTESTINAL;  Surgeon: Ruby Corporal, MD;  [facility];  Service: Endoscopy;;   TUBAL LIGATION       reports that she has never smoked. She has never been exposed to tobacco smoke. She has never used smokeless tobacco. She reports that she does not drink alcohol and does not use drugs.  Family History  Problem Relation Age of Onset   Cardiomyopathy Mother    Breast cancer Mother    Prostate cancer  Father    Heart attack Maternal Grandmother    Heart attack Maternal Grandfather    Heart attack Paternal Grandmother    Bipolar disorder Daughter    Heart disease Maternal Aunt    Neurofibromatosis Cousin    Colon cancer Neg Hx      Physical  Exam: Vitals:   01/20/24 2300 01/21/24 0000 01/21/24 0120 01/21/24 0200  BP: (!) 113/49 (!) 123/48  (!) 137/57  Pulse: 91 87 91 90  Resp: 16 12 12 14   Temp: 98.3 F (36.8 C)     SpO2: 97% 97% 96% 95%    Gen: Awake, alert, Chronically ill appearing.   CV: Regular, normal S1, S2, no murmurs  Resp: Normal WOB, CTAB  Abd: obese, normoactive, nontender MSK: Symmetric, midline back tenderness over the L spine.  Skin: No rashes or lesions to exposed skin  Neuro: Alert and interactive  Psych: euthymic, appropriate    Data review:   Labs reviewed, notable for:   Unremarkable  Hb 9 near baseline   Micro:  Results for orders placed or performed during the hospital encounter of 08/05/23  Resp panel by RT-PCR (RSV, Flu A&B, Covid) Anterior Nasal Swab     Status: None   Collection Time: 08/05/23 10:49 AM   Specimen: Anterior Nasal Swab  Result Value Ref Range Status   SARS Coronavirus 2 by RT PCR NEGATIVE NEGATIVE Final    Comment: (NOTE) SARS-CoV-2 target nucleic acids are NOT DETECTED.  The SARS-CoV-2 RNA is generally detectable in upper respiratory specimens during the acute phase of infection. The lowest concentration of SARS-CoV-2 viral copies this assay can detect is 138 copies/mL. A negative result does not preclude SARS-Cov-2 infection and should not be used as the sole basis for treatment or other patient management decisions. A negative result may occur with  improper specimen collection/handling, submission of specimen other than nasopharyngeal swab, presence of viral mutation(s) within the areas targeted by this assay, and inadequate number of viral copies(<138 copies/mL). A negative result must be combined with clinical  observations, patient history, and epidemiological information. The expected result is Negative.  Fact Sheet for Patients:  BloggerCourse.com  Fact Sheet for Healthcare Providers:  SeriousBroker.it  This test is no t yet approved or cleared by the [place]  FDA and  has been authorized for detection and/or diagnosis of SARS-CoV-2 by FDA under an Emergency Use Authorization (EUA). This EUA will remain  in effect (meaning this test can be used) for the duration of the COVID-19 declaration under Section 564(b)(1) of the Act, 21 U.S.C.section 360bbb-3(b)(1), unless the authorization is terminated  or revoked sooner.       Influenza A by PCR NEGATIVE NEGATIVE Final   Influenza B by PCR NEGATIVE NEGATIVE Final    Comment: (NOTE) The Xpert Xpress SARS-CoV-2/FLU/RSV plus assay is intended as an aid in the diagnosis of influenza from Nasopharyngeal swab specimens and should not be used as a sole basis for treatment. Nasal washings and aspirates are unacceptable for Xpert Xpress SARS-CoV-2/FLU/RSV testing.  Fact Sheet for Patients: BloggerCourse.com  Fact Sheet for Healthcare Providers: SeriousBroker.it  This test is not yet approved or cleared by the [place]  FDA and has been authorized for detection and/or diagnosis of SARS-CoV-2 by FDA under an Emergency Use Authorization (EUA). This EUA will remain in effect (meaning this test can be used) for the duration of the COVID-19 declaration under Section 564(b)(1) of the Act, 21 U.S.C. section 360bbb-3(b)(1), unless the authorization is terminated or revoked.     Resp Syncytial Virus by PCR NEGATIVE NEGATIVE Final    Comment: (NOTE) Fact Sheet for Patients: BloggerCourse.com  Fact Sheet for Healthcare Providers: SeriousBroker.it  This test is not yet approved or cleared by  the [place]  FDA and has been authorized for detection and/or diagnosis of SARS-CoV-2 by  FDA under an Emergency Use Authorization (EUA). This EUA will remain in effect (meaning this test can be used) for the duration of the COVID-19 declaration under Section 564(b)(1) of the Act, 21 U.S.C. section 360bbb-3(b)(1), unless the authorization is terminated or revoked.  Performed at [facility], [address], [place], [place] [address]    *Note: Due to a large number of results and/or encounters for the requested time period, some results have not been displayed. A complete set of results can be found in Results Review.    Imaging reviewed:  CT Lumbar Spine Wo Contrast Result Date: 01/21/2024 CLINICAL DATA:  Lumbar compression fracture EXAM: CT LUMBAR SPINE WITHOUT CONTRAST TECHNIQUE: Multidetector CT imaging of the lumbar spine was performed without intravenous contrast administration. Multiplanar CT image reconstructions were also generated. RADIATION DOSE REDUCTION: This exam was performed according to the departmental dose-optimization program which includes automated exposure control, adjustment of the mA and/or kV according to patient size and/or use of iterative reconstruction technique. COMPARISON:  CT lumbar spine 12/18/2022 an MRI lumbar spine 01/03/2024 FINDINGS: Segmentation: 5 lumbar type vertebrae. Alignment: Normal. Vertebrae: There are chronic compression deformities of L3 and L4, unchanged since 01/03/2024. There is a new, mild wedge compression fracture of the L1 superior endplate with approximately 10% height loss. Paraspinal and other soft tissues: Calcific aortic atherosclerosis. Disc levels: No spinal canal stenosis. IMPRESSION: 1. New, mild wedge compression fracture of the L1 superior endplate with approximately 10% height loss. 2. Chronic compression deformities of L3 and L4, unchanged since 01/03/2024. Aortic Atherosclerosis (ICD10-I70.0). Electronically Signed   By: Juanetta Nordmann M.D.   On: 01/21/2024 01:26   CT Chest Wo Contrast Result Date: 01/21/2024 CLINICAL DATA:  Pneumonia, complication suspected, xray done Pneumonia vs edema vs sarcoidosis on CXR EXAM: CT CHEST WITHOUT CONTRAST TECHNIQUE: Multidetector CT imaging of the chest was performed following the standard protocol without IV contrast. RADIATION DOSE REDUCTION: This exam was performed according to the departmental dose-optimization program which includes automated exposure control, adjustment of the mA and/or kV according to patient size and/or use of iterative reconstruction technique. COMPARISON:  Chest x-ray 01/20/2024, CT angio chest 08/05/2023, CT angio chest 09/11/2012 FINDINGS: Cardiovascular: Normal heart size. No significant pericardial effusion. The thoracic aorta is normal in caliber. Mild atherosclerotic plaque of the thoracic aorta. No coronary artery calcifications. Mitral annular calcification Mediastinum/Nodes: No gross hilar adenopathy, noting limited sensitivity for the detection of hilar adenopathy on this noncontrast study. No enlarged mediastinal or axillary lymph nodes. Thyroid  gland, trachea, and esophagus demonstrate no significant findings. Lungs/Pleura: Similar-appearing chronic bilateral upper lobe paramediastinal architectural distortion, bronchiectasis, peribronchovascular consolidation consistent with scarring. No focal consolidation. No pulmonary nodule. No pulmonary mass. No pleural effusion. No pneumothorax. Upper Abdomen: Status post cholecystectomy. Hepatic steatosis. Atrophic pancreas. Musculoskeletal: No chest wall abnormality. No suspicious lytic or blastic osseous lesions. No acute displaced fracture. Chronic T11 superior endplate compression fracture. IMPRESSION: 1. No acute intrathoracic abnormality. 2. Similar-appearing chronic bilateral upper lobe paramediastinal architectural distortion, bronchiectasis, peribronchovascular consolidation consistent with scarring. 3. Hepatic  steatosis. 4. Aortic Atherosclerosis (ICD10-I70.0) including mitral annular calcification. Electronically Signed   By: Morgane  Naveau M.D.   On: 01/21/2024 00:44   DG Chest Port 1 View Result Date: 01/20/2024 CLINICAL DATA:  Chest pain and difficulty breathing. EXAM: PORTABLE CHEST 1 VIEW COMPARISON:  Jan 03, 2024 FINDINGS: The cardiac silhouette is enlarged and unchanged in size. There is moderate to marked severity prominence of the pulmonary vasculature, increased in severity when compared to the  prior study. Moderate to marked severity diffusely increased interstitial lung markings are also seen. There is moderate to marked severity airspace disease seen within the periphery of the mid to lower left lung. This is limited in evaluation secondary to overlying breast attenuation artifact. Biapical pleural thickening is noted. A small to moderate size left pleural effusion is also suspected. No pneumothorax is identified. Multilevel degenerative changes seen throughout the thoracic spine. IMPRESSION: 1. Cardiomegaly with moderate to marked severity pulmonary vascular congestion and interstitial edema. 2. Moderate to marked severity airspace disease within the periphery of the mid to lower left lung, which may represent asymmetric pulmonary edema versus pneumonia. Further evaluation with nonemergent chest CT is recommended to further exclude the presence of an underlying neoplastic process. 3. Small to moderate size left pleural effusion. Electronically Signed   By: Virgle Grime M.D.   On: 01/20/2024 23:38     ED Course:  Treated with Dilaudid  IV without control of pain     Assessment/Plan:  63 y.o. female with hx chronic respiratory failure on 5 L, interstitial lung disease, sarcoidosis, Addison's disease, hypothyroidism, HLD, morbid obesity, osteoporosis with vertebral compression fractures, AVN hips, urinary retention, chronic pain syndrome, chronic anemia, anxiety and depression, who presents  with acute worsening of lower back pain. Found to have acute compression fracture at L1.   Acute compression fracture L1  Hx chronic pain with recent L3/4 compression fractures  Clinical diagnosis osteoporosis  Debility  Essentially atraumatic fracture reports moving legs and hearing pop in her lower back with acute worsening of pain. CT L spine with new, mild wedge compression fracture of the L1 superior endplate with approximately 10% height loss.  - Symptomatic management with multimodal pain control Tylenol  prn mild, adjuncts heat, lidocaine , diclofenac  gel, methocarbamol  for spasm, oxycodone  15/20 mg every 4 hours as needed for moderate/severe pain, Dilaudid  1 mg IV every 4 hours as needed for breakthrough - Has TLSO brace but per recent documentation no longer fits patient.  Can contact clinic in a.m. for refitting.  Recommend TLSO when up out of bed - PT/OT evaluation - Check vitamin D , start on 1000 units daily for now.  Further outpatient follow-up with PCP/endocrine for treatment of osteoporosis - Recommend weight loss to help offload her back, consider GLP-1, bariatric surgery referral.  - Outpatient follow-up with pulmonology for preoperative evaluation and Dr. Ardyth Beech / spine for potential kyphoplasty in the future  Chronic medical problems: Sarcoidosis, ILD, CHRF on 5 L O2: Follows with outpatient pulmonology.  Continue home prednisone  5 mg.  Incentive spirometry, nebs as needed Addison's disease: Continue home hydrocortisone  20 mg a.m./10 mg p.m. Chronic hypotension: Continue home midodrine  5 mg twice daily Hyperlipidemia: Continue home rosuvastatin  Hypothyroidism: Continue home levothyroxine  Morbid obesity: See above recommending weight loss, GLP-1 consideration, bariatric surgery referral outpatient AVN hips: Noted Urinary retention: Continue on tamsulosin  Chronic anemia: Stable around 9, defer additional evaluation to outpatient. Chronic pain syndrome: See acute  pain management above Anxiety and depression: Continue home buspirone    There is no height or weight on file to calculate BMI.    DVT prophylaxis:  Lovenox  Code Status:  Full Code Diet:  Diet Orders (From admission, onward)     Start     Ordered   01/21/24 0158  Diet regular Room service appropriate? Yes; Fluid consistency: Thin  Diet effective now       Question Answer Comment  Room service appropriate? Yes   Fluid consistency: Thin      01/21/24  0201           Family Communication:  No   Consults:  None   Admission status:   Observation, Med-Surg  Severity of Illness: The appropriate patient status for this patient is OBSERVATION. Observation status is judged to be reasonable and necessary in order to provide the required intensity of service to ensure the patient's safety. The patient's presenting symptoms, physical exam findings, and initial radiographic and laboratory data in the context of their medical condition is felt to place them at decreased risk for further clinical deterioration. Furthermore, it is anticipated that the patient will be medically stable for discharge from the hospital within 2 midnights of admission.    Arnulfo Larch, MD [facility]  How to contact the [facility] Attending or Consulting provider 7A - 7P or covering provider during after hours 7P -7A, for this patient.  Check the care team in [facility] and look for a) attending/consulting [facility] provider listed and b) the [facility] team listed Log into [email or web address] and use [facility]'s universal password to access. If you do not have the password, please contact the hospital operator. Locate the [facility] provider you are looking for under [facility] and page to a number that you can be directly reached. If you still have difficulty reaching the provider, please page the [facility] (Director on Call) for the Hospitalists listed on amion for assistance.  01/21/2024, 2:31 AM

## 2024-01-21 NOTE — Progress Notes (Signed)
 [facility] PROGRESS NOTE  Kathleen [name] (DOB: 13-Mar-1961) MWN:[id number] PCP: Bennet Brasil, MD  Brief Narrative: Kathleen [name] is a 63 y.o. female with a history of chronic hypoxic respiratory failure (5L O2), ILD, sarcoidosis, Addison's disease, morbid obesity, bilateral hip AVN, osteoporosis with multiple vertebral fractures on chronic narcotics, and recent admissions for intractable pain due to compression fractures of L4 (4/12-4/16) and then L3 (5/9-5/14), subsequently discharged to SNF, then to home once she entered into copayment days with her insurance and presented to the ED on 01/20/2024 with chest pain and ruled out for ACS, also then complained of back pain, found to have a new L1 compression fracture. She was admitted this morning for intractable back pain.   Subjective: Pain still severe, also reporting worse swelling in her legs. Does not mention chest pain and denies any new shortness of breath. Husband and M.I.L. at bedside.   Objective: BP (!) 131/45 (BP Location: Right Arm)   Pulse 91   Temp 98 F (36.7 C) (Oral)   Resp 20   LMP 05/17/2016   SpO2 100%   Gen: Obese female laying on left side in bed in no distress Pulm: Diminished without wheezes, nonlabored on home O2, 100%.  CV: RRR, no MRG, 2+ pitting LE edema dependently GI: Soft, NT, ND, +BS  Neuro: Alert and oriented. No new focal deficits. Ext: Warm, dry, extreme tenderness to palpation along lower back  Assessment & Plan: 63yo F w/Addison's disease on chronic steroids, osteoporosis, bilateral hip AVN, and has sustained nontraumatic compression fractures of L4, then L3, and now L1 in the past 2 months.  - PDMP reviewed as she has significant opioid tolerance, will continue oxycodone  20mg  prn and increase dilaudid  IV to 2mg  prn to facilitate mobility/PT.  - SNF was recommended though she is planning to return home once pain is better controlled.  - She will need pulmonary follow up to opine on candidacy  for kyphoplasty, then follow up with her surgeon, Dr. [name].  - Maintain TLSO brace.   - Volume up, usually on lasix  MWF, will give dose of IV lasix  today.   Addressing long term needs:  - Needs bariatric referral - Also needs endocrinology follow up for osteoporosis.  - Palliative care involvement may be prudent going forward as well.  Please see H&P from this AM for full details on plan.  Wynetta Heckle, MD [facility] [email or web address] 01/21/2024, 1:39 PM

## 2024-01-21 NOTE — Plan of Care (Signed)
  Problem: Education: Goal: Knowledge of General Education information will improve Description: Including pain rating scale, medication(s)/side effects and non-pharmacologic comfort measures Outcome: Progressing   Problem: Health Behavior/Discharge Planning: Goal: Ability to manage health-related needs will improve Outcome: Not Progressing   Problem: Clinical Measurements: Goal: Ability to maintain clinical measurements within normal limits will improve Outcome: Progressing Goal: Will remain free from infection Outcome: Progressing Goal: Diagnostic test results will improve Outcome: Progressing Goal: Respiratory complications will improve Outcome: Not Progressing Goal: Cardiovascular complication will be avoided Outcome: Progressing   Problem: Activity: Goal: Risk for activity intolerance will decrease Outcome: Not Progressing   Problem: Nutrition: Goal: Adequate nutrition will be maintained Outcome: Progressing   Problem: Coping: Goal: Level of anxiety will decrease Outcome: Progressing   Problem: Elimination: Goal: Will not experience complications related to bowel motility Outcome: Progressing Goal: Will not experience complications related to urinary retention Outcome: Progressing   Problem: Pain Managment: Goal: General experience of comfort will improve and/or be controlled Outcome: Progressing   Problem: Safety: Goal: Ability to remain free from injury will improve Outcome: Progressing   Problem: Skin Integrity: Goal: Risk for impaired skin integrity will decrease Outcome: Progressing

## 2024-01-21 NOTE — Evaluation (Signed)
 Physical Therapy Evaluation Patient Details Name: Kathleen [name] MRN: [id number] DOB: July 18, 1961 Today's Date: 01/21/2024  History of Present Illness  Kathleen [name] is a 63 y.o. female with hx of chronic respiratory failure on 5 L, interstitial lung disease, sarcoidosis, Addison's disease, hypothyroidism, HLD, morbid obesity, osteoporosis with vertebral compression fractures, AVN hips, urinary retention, chronic pain syndrome, chronic anemia, anxiety and depression, who presents with acute worsening of lower back pain. Reports she has been nonambulatory for about the past month (since her last SNF stay). About 3 days ago was just moving her legs and felt a pop in her back with acute worsening of pain. Pain is severe with any movement and she is screaming out in pain intermittently during interview. She reports having feeling like her leg is going to sleep sometimes, but does not seem that she has true numbness or weakness in LE, or incontinence. Pain uncontrolled despite her home oxycodone . Follows with Dr. [name] of [facility] and will need clearance by pulmonology to be considered for kyphoplasty.   Clinical Impression  Patient demonstrates slow labored movement for completing sit to stands, transfers and limited to a few side steps at bedside before having to sit due to c/o severe low back pain. Patient requested to go back to bed with fair/good carryover for rolling to side and and staying in side lying position in bed after therapy. Patient will benefit from continued skilled physical therapy in hospital and recommended venue below to increase strength, balance, endurance for safe ADLs and gait.         If plan is discharge home, recommend the following: A lot of help with walking and/or transfers;A lot of help with bathing/dressing/bathroom;Help with stairs or ramp for entrance;Assistance with cooking/housework   Can travel by private vehicle   No    Equipment Recommendations None  recommended by PT  Recommendations for Other Services       Functional Status Assessment Patient has had a recent decline in their functional status and demonstrates the ability to make significant improvements in function in a reasonable and predictable amount of time.     Precautions / Restrictions Precautions Precautions: Fall Recall of Precautions/Restrictions: Intact Precaution/Restrictions Comments: LSO brace Restrictions Weight Bearing Restrictions Per Provider Order: No      Mobility  Bed Mobility Overal bed mobility: Needs Assistance Bed Mobility: Rolling, Sit to Supine Rolling: Contact guard assist, Supervision     Sit to supine: Supervision, Contact guard assist   General bed mobility comments: labored movement with c/o increasing low back pain    Transfers Overall transfer level: Needs assistance Equipment used: Rolling walker (2 wheels) Transfers: Sit to/from Stand, Bed to chair/wheelchair/BSC Sit to Stand: Min assist   Step pivot transfers: Min assist, Mod assist       General transfer comment: unsteady labored movement with c/o severe low back pain    Ambulation/Gait Ambulation/Gait assistance: Min assist, Mod assist Gait Distance (Feet): 5 Feet Assistive device: Rolling walker (2 wheels) Gait Pattern/deviations: Decreased step length - left, Decreased stance time - right, Decreased stride length, Step-to pattern, Wide base of support Gait velocity: slow     General Gait Details: limited to a few side steps at bedside with c/o severe pain in low back, declined to attempt walking away from bedside due to fear of falling  Stairs            Wheelchair Mobility     Tilt Bed    Modified Rankin (Stroke Patients  Only)       Balance Overall balance assessment: Needs assistance Sitting-balance support: Feet supported, No upper extremity supported Sitting balance-Leahy Scale: Fair Sitting balance - Comments: fair/good seated at EOB    Standing balance support: Reliant on assistive device for balance, During functional activity, Bilateral upper extremity supported Standing balance-Leahy Scale: Poor Standing balance comment: fair/poor using RW                             Pertinent Vitals/Pain Pain Assessment Pain Assessment: Faces Faces Pain Scale: Hurts whole lot Pain Location: low back Pain Descriptors / Indicators: Guarding, Grimacing, Moaning, Sharp Pain Intervention(s): Limited activity within patient's tolerance, Monitored during session, Premedicated before session, Repositioned    Home Living Family/patient expects to be discharged to:: Private residence Living Arrangements: Spouse/significant other Available Help at Discharge: Family;Available PRN/intermittently Type of Home: House Home Access: Stairs to enter Entrance Stairs-Rails: None Entrance Stairs-Number of Steps: 1   Home Layout: One level Home Equipment: Agricultural consultant (2 wheels);Electric scooter;Shower seat;Hand held shower head      Prior Function Prior Level of Function : Needs assist       Physical Assist : Mobility (physical);ADLs (physical) Mobility (physical): Bed mobility;Transfers;Gait;Stairs ADLs (physical): Dressing;Bathing;Toileting;IADLs Mobility Comments: short household distances using RW, scooter ADLs Comments: Husband assist with most ADL's pt able to feed self and comb hair.     Extremity/Trunk Assessment   Upper Extremity Assessment Upper Extremity Assessment: Generalized weakness    Lower Extremity Assessment Lower Extremity Assessment: Generalized weakness    Cervical / Trunk Assessment Cervical / Trunk Assessment: Normal  Communication   Communication Communication: No apparent difficulties    Cognition Arousal: Alert Behavior During Therapy: WFL for tasks assessed/performed   PT - Cognitive impairments: No apparent impairments                         Following commands:  Intact       Cueing Cueing Techniques: Verbal cues, Tactile cues     General Comments      Exercises     Assessment/Plan    PT Assessment Patient needs continued PT services  PT Problem List Decreased strength;Decreased activity tolerance;Decreased balance;Decreased mobility;Pain       PT Treatment Interventions DME instruction;Gait training;Stair training;Functional mobility training;Therapeutic activities;Therapeutic exercise;Balance training;Patient/family education    PT Goals (Current goals can be found in the Care Plan section)  Acute Rehab PT Goals Patient Stated Goal: return home with family to assist Time For Goal Achievement: 02/04/24 Potential to Achieve Goals: Good    Frequency Min 3X/week     Co-evaluation               AM-PAC PT "6 Clicks" Mobility  Outcome Measure Help needed turning from your back to your side while in a flat bed without using bedrails?: A Little Help needed moving from lying on your back to sitting on the side of a flat bed without using bedrails?: A Lot Help needed moving to and from a bed to a chair (including a wheelchair)?: A Little Help needed standing up from a chair using your arms (e.g., wheelchair or bedside chair)?: A Lot Help needed to walk in hospital room?: A Lot Help needed climbing 3-5 steps with a railing? : A Lot 6 Click Score: 14    End of Session   Activity Tolerance: Patient tolerated treatment well;Patient limited by fatigue;Patient limited by pain Patient  left: in bed;with call bell/phone within reach Nurse Communication: Mobility status PT Visit Diagnosis: Unsteadiness on feet (R26.81);Other abnormalities of gait and mobility (R26.89);Muscle weakness (generalized) (M62.81)    Time: 5621-3086 PT Time Calculation (min) (ACUTE ONLY): 18 min   Charges:   PT Evaluation $PT Eval Low Complexity: 1 Low PT Treatments $Therapeutic Activity: 8-22 mins PT General Charges $$ ACUTE PT VISIT: 1 Visit          2:30 PM, 01/21/24 Walton Guppy, MPT Physical Therapist with [facility] [phone] office [phone] mobile phone

## 2024-01-21 NOTE — TOC Initial Note (Signed)
 Transition of Care [facility]) - Initial/Assessment Note    Patient Details  Name: Kathleen [name] MRN: [id number] Date of Birth: 06/06/1961  Transition of Care [facility]) CM/SW Contact:    Orelia Binet, RN Phone Number: 01/21/2024, 12:52 PM  Clinical Narrative:     Patient readmitted with lumbar compression fracture. PT is recommending SNF. CM at the bedside to discuss SNF. Patient was recently at [facility] and discharged home when she started copay days. CM checked with Destiny at [facility] it would be $214 per day for SNF and they have not bed availability currently.  Patient wants to get pain under control and discharge home. Last visit TOC set up home health with [facility] and she has wheelchair, walkers and bed side commode. TOC following, MD updated.   Addendum : Patient uses [employer] for Home Oxygen - baseline 5L.   Expected Discharge Plan: Home/Self Care Barriers to Discharge: Continued Medical Work up   Patient Goals and CMS Choice Patient states their goals for this hospitalization and ongoing recovery are:: return home CMS Medicare.gov Compare Post Acute Care list provided to:: Patient      Expected Discharge Plan and Services      Living arrangements for the past 2 months: Single Family Home                      Prior Living Arrangements/Services Living arrangements for the past 2 months: Single Family Home Lives with:: Spouse            Current home services: DME   Activities of Daily Living   ADL Screening (condition at time of admission) Independently performs ADLs?: No Does the patient have a NEW difficulty with bathing/dressing/toileting/self-feeding that is expected to last >3 days?: No Does the patient have a NEW difficulty with getting in/out of bed, walking, or climbing stairs that is expected to last >3 days?: No Does the patient have a NEW difficulty with communication that is expected to last >3 days?: No Is the patient deaf or have difficulty hearing?: No Does the  patient have difficulty seeing, even when wearing glasses/contacts?: No Does the patient have difficulty concentrating, remembering, or making decisions?: No  Permission Sought/Granted       Emotional Assessment     Orientation: : Oriented to Self, Oriented to  Time, Oriented to Place, Oriented to Situation Alcohol / Substance Use: Not Applicable Psych Involvement: No (comment)  Admission diagnosis:  Atypical chest pain [R07.89] Lumbar compression fracture (HCC) [S32.000A] Chronic low back pain without sciatica, unspecified back pain laterality [M54.50, G89.29] Compression fracture of L1 vertebra, initial encounter (HCC) [S32.010A] Patient Active Problem List   Diagnosis Date Noted   Lumbar compression fracture (HCC) 01/21/2024   Intractable low back pain 01/03/2024   Ambulatory dysfunction 12/11/2023   Uncontrolled pain 12/08/2023   Pressure injury of skin 12/08/2023   Intractable back pain 12/07/2023   Class 1 obesity due to excess calories with serious comorbidity and body mass index (BMI) of 34.0 to 34.9 in adult 10/28/2023   Prediabetes 10/28/2023   Ankle fracture, bimalleolar, closed, left, sequela 03/10/2023   Closed fracture of left ankle 03/10/2023   Syndesmotic disruption of ankle, left, initial encounter 03/10/2023   Lumbar transverse process fracture (HCC) 01/02/2023   Acute on chronic hypoxic respiratory failure (HCC) 12/13/2022   Depression, major, single episode, moderate (HCC) 09/10/2022   Aortic atherosclerosis (HCC) 09/10/2022   Chronic respiratory failure with hypoxia (HCC) 05/22/2021   Anxiety and depression 05/22/2021   Vitamin  D deficiency 05/09/2021   Left foot drop 01/09/2021   Pulmonary HTN (HCC) 10/12/2020   Encounter for long-term opiate analgesic use 02/02/2020   Interstitial lung disease (HCC) 04/14/2019   GERD (gastroesophageal reflux disease) 10/15/2017   Personal history of noncompliance with medical treatment, presenting hazards to health  04/26/2017   Hypothyroidism 01/15/2017   Adrenal insufficiency (Addison's disease) (HCC) 08/22/2016   Symptomatic bradycardia    Adrenal insufficiency (HCC) 08/17/2016   Hypokalemia 08/17/2016   Chronic pain syndrome 04/20/2016   Irritable bowel syndrome with constipation 11/14/2015   Avascular necrosis of bone of hip, left (HCC) 04/05/2015   Post herpetic neuralgia 10/22/2014   Mixed hyperlipidemia 08/25/2014   Obstructive sleep apnea 02/18/2014   Class 2 severe obesity due to excess calories with serious comorbidity and body mass index (BMI) of 38.0 to 38.9 in adult [facility]) 05/06/2013   Fibromyalgia 02/11/2013   Bilateral lower extremity edema 01/22/2013   Hypotension 01/16/2013   Fibrocystic breast disease in female 04/09/2012   Sarcoidosis 06/21/2008   PCP:  Bennet Brasil, MD Pharmacy:   [facility] [phone] - [place], [place] - [address] AT [facility] OF [address] & [address] [place] [address] Phone: [phone] Fax: [phone]  [place] [facility] [address] [address] Phone: [phone] Fax: [phone]  [place] [facility] [address] [place] [address] Phone: [phone] Fax: [phone]    Social Drivers of Health (SDOH) Social History: SDOH Screenings   Food Insecurity: No Food Insecurity (01/21/2024)  Housing: Low Risk  (01/21/2024)  Transportation Needs: No Transportation Needs (01/21/2024)  Utilities: Not At Risk (01/21/2024)  Alcohol Screen: Low Risk  (09/27/2023)  Depression (PHQ2-9): Low Risk  (01/01/2024)  Financial Resource Strain: Low Risk  (09/27/2023)  Physical Activity: Inactive (09/27/2023)  Social Connections: Socially Integrated (01/21/2024)  Stress: No Stress Concern Present (09/27/2023)  Tobacco Use: Low Risk  (01/21/2024)  Health Literacy: Medium Risk (12/20/2023)   Received from [facility]   SDOH Interventions:      Readmission Risk Interventions    12/11/2023   11:11 AM 04/08/2023   10:38 AM  Readmission Risk Prevention Plan  Transportation Screening Complete Complete  HRI or Home Care Consult Complete   Social Work Consult for Recovery Care Planning/Counseling Complete   Palliative Care Screening Not Applicable   Medication Review [employer]) Complete Complete  HRI or Home Care Consult  Complete  SW Recovery Care/Counseling Consult  Complete  Palliative Care Screening  Not Applicable  Skilled Nursing Facility  Not Applicable

## 2024-01-22 DIAGNOSIS — S32010A Wedge compression fracture of first lumbar vertebra, initial encounter for closed fracture: Secondary | ICD-10-CM | POA: Diagnosis not present

## 2024-01-22 LAB — BASIC METABOLIC PANEL WITH GFR
Anion gap: 6 (ref 5–15)
BUN: 14 mg/dL (ref 8–23)
CO2: 37 mmol/L — ABNORMAL HIGH (ref 22–32)
Calcium: 9.1 mg/dL (ref 8.9–10.3)
Chloride: 100 mmol/L (ref 98–111)
Creatinine, Ser: 0.79 mg/dL (ref 0.44–1.00)
GFR, Estimated: >60 mL/min (ref >60)
Glucose, Bld: 116 mg/dL — ABNORMAL HIGH (ref 70–99)
Potassium: 3.9 mmol/L (ref 3.5–5.1)
Sodium: 143 mmol/L (ref 135–145)

## 2024-01-22 LAB — VITAMIN D 25 HYDROXY (VIT D DEFICIENCY, FRACTURES): Vit D, 25-Hydroxy: 9.65 ng/mL — ABNORMAL LOW (ref 30–100)

## 2024-01-22 LAB — MAGNESIUM: Magnesium: 2.3 mg/dL (ref 1.7–2.4)

## 2024-01-22 LAB — PHOSPHORUS: Phosphorus: 4.5 mg/dL (ref 2.5–4.6)

## 2024-01-22 MED ORDER — CYCLOBENZAPRINE HCL 10 MG PO TABS
5.0000 mg | ORAL_TABLET | Freq: Three times a day (TID) | ORAL | Status: DC
  Administered 2024-01-22 – 2024-01-23 (×3): 5 mg via ORAL
  Filled 2024-01-22 (×3): qty 1

## 2024-01-22 MED ORDER — HYDROMORPHONE HCL 1 MG/ML IJ SOLN
1.0000 mg | INTRAMUSCULAR | Status: DC | PRN
  Administered 2024-01-22 – 2024-01-23 (×5): 1 mg via INTRAVENOUS
  Filled 2024-01-22 (×5): qty 1

## 2024-01-22 MED ORDER — LACTULOSE 10 GM/15ML PO SOLN
30.0000 g | Freq: Once | ORAL | Status: AC
  Administered 2024-01-22: 30 g via ORAL
  Filled 2024-01-22: qty 60

## 2024-01-22 MED ORDER — SENNOSIDES-DOCUSATE SODIUM 8.6-50 MG PO TABS
2.0000 | ORAL_TABLET | Freq: Every day | ORAL | Status: DC
  Administered 2024-01-22: 2 via ORAL
  Filled 2024-01-22: qty 2

## 2024-01-22 NOTE — Progress Notes (Signed)
 Patient found by RT to be without O2 in her nose. Sats were 72%. RT re-applied O2 with improvement in sats to 98%.

## 2024-01-22 NOTE — Progress Notes (Signed)
 Mobility Specialist Progress Note:    01/22/24 1536  Mobility  Activity Ambulated with assistance in room;Transferred from bed to chair  Level of Assistance Moderate assist, patient does 50-74%  Assistive Device Front wheel walker  Distance Ambulated (ft) 6 ft  Range of Motion/Exercises Active;All extremities  Activity Response Tolerated well  Mobility visit 1 Mobility  Mobility Specialist Start Time (ACUTE ONLY) 1515  Mobility Specialist Stop Time (ACUTE ONLY) 1536  Mobility Specialist Time Calculation (min) (ACUTE ONLY) 21 min   Pt received in bed, agreeable to mobility. Required ModA to stand and ambulate with RW. Tolerated well, c/o back pain. Alarm on, RN in room. All needs met.   Glinda Lapping Mobility Specialist Please contact via Special educational needs teacher or  Rehab office at [phone]

## 2024-01-22 NOTE — Progress Notes (Signed)
 PROGRESS NOTE  Kathleen [name], is a 63 y.o. female, DOB - 03/09/1961, OZH:[id number]  Admit date - 01/20/2024   Admitting Physician Arnulfo Larch, MD  Outpatient Primary MD for the patient is Bennet Brasil, MD  LOS - 1  Chief Complaint  Patient presents with   Back Pain      Brief Narrative:   63 y.o. female with hx of chronic respiratory failure on 5 L, interstitial lung disease, sarcoidosis, Addison's disease, hypothyroidism, HLD, morbid obesity, osteoporosis with vertebral compression fractures, AVN hips, urinary retention, chronic pain syndrome, chronic anemia, anxiety and depression admitted on 01/21/2024 with acute L1 fracture resulting in acute low back pain   -Assessment and Plan: 1)Acute L1 compression fracture--pain control improving, reduce IV Dilaudid  to 1 mg from 2 mg --Encourage patient to use oral pain control more, rather than IV -Subacute L3 and L4 compression fractures- - Physical therapist recommends SNF - Patient declines, SNF patient would like to go home once pain control is improved  2)Acute compression fracture L1  Hx chronic pain with recent L3/4 compression fractures  Clinical diagnosis osteoporosis  Debility  Essentially atraumatic fracture reports moving legs and hearing pop in her lower back with acute worsening of pain. CT L spine with new, mild wedge compression fracture of the L1 superior endplate with approximately 10% height loss.  - Symptomatic management with multimodal pain control Tylenol  prn mild, adjuncts heat, lidocaine , diclofenac  gel, Flexeril for spasm, oxycodone  as needed for moderate/severe pain, Dilaudid  1 mg IV every 4 hours as needed for breakthrough - Has TLSO brace but per recent documentation no longer fits patient.  Can contact orthospine clinic for refitting.  Recommend TLSO when up out of bed  3)Vit D Deficiency---replace, follow-up with Dr. Nida Gebreselassie as outpatient  4)Osteopenia----?? Osteoporosis in the setting of  chronic prednisone  use -Bone density test from 02/23/2020 showed T-score of -2.3 (osteopenic range) - Calcium ,  vitamin D  and calcitonin advised as above - Follow-up with Dr. Nida Gebreselassie for repeat bone density test and to discuss possible treatment osteoporosis including Prolia  5)Morbid Obesity- -- Recommend weight loss to help offload her back, consider GLP-1, bariatric surgery referral. -Low calorie diet, portion control and increase physical activity discussed with patient -Body mass index is 42.93 kg/m.  Chronic medical problems: Sarcoidosis, ILD, CHRF on 5 L O2: Follows with outpatient pulmonology-Dr Jolyne [facility] at [facility]. Continue home prednisone  5 mg for sarcoidosis/ILD, incentive spirometry, nebs as needed Addison's disease: Continue home hydrocortisone  Chronic hypotension: Continue home midodrine  5 mg twice daily Hyperlipidemia: Continue home rosuvastatin  Hypothyroidism: Continue home levothyroxine  AVN hips: Noted Urinary retention: Continue on tamsulosin  Chronic anemia: Stable around 9, defer additional evaluation to outpatient. Acute on Chronic pain syndrome: See acute pain management above Anxiety and depression: Continue home buspirone , cymbalta , prn atarax   Status is: Inpatient   Disposition: The patient is from: Home              Anticipated d/c is to: Home              Anticipated d/c date is: 1 day              Patient currently is not medically stable to d/c. Barriers: Not Clinically Stable-   Code Status :  -  Code Status: Full Code   Family Communication:    (patient is alert, awake and coherent)  Discussed with Husband and her husband at bedside  DVT Prophylaxis  :   - SCDs  enoxaparin  (LOVENOX ) injection  40 mg Start: 01/21/24 1000   Lab Results  Component Value Date   PLT 407 (H) 01/20/2024   Inpatient Medications  Scheduled Meds:  budesonide   0.5 mg Nebulization Daily   busPIRone   15 mg Oral TID   Chlorhexidine  Gluconate Cloth  6 each Topical  Daily   cholecalciferol   1,000 Units Oral Daily   cyclobenzaprine  5 mg Oral TID   diclofenac  Sodium  4 g Topical QID   DULoxetine   60 mg Oral Daily   enoxaparin  (LOVENOX ) injection  40 mg Subcutaneous Q24H   ferrous sulfate   325 mg Oral Weekly   furosemide   20 mg Oral Q M,W,F   hydrocortisone   10 mg Oral Q24H   hydrocortisone   20 mg Oral Q breakfast   levothyroxine   50 mcg Oral Q0600   lidocaine   1 patch Transdermal Q24H   linaclotide   290 mcg Oral QAC breakfast   midodrine   5 mg Oral BID WC   pantoprazole   40 mg Oral BID AC   polyethylene glycol  17 g Oral BID   predniSONE   5 mg Oral q AM   rosuvastatin   20 mg Oral Daily   senna-docusate  2 tablet Oral QHS   tamsulosin   0.4 mg Oral QPC supper   Continuous Infusions: PRN Meds:.acetaminophen , alum & mag hydroxide-simeth, bisacodyl , HYDROmorphone  (DILAUDID ) injection, hydrOXYzine , ipratropium-albuterol , melatonin, methocarbamol , oxyCODONE  **OR** oxyCODONE , polyethylene glycol   Anti-infectives (From admission, onward)    None       Subjective: Kathleen [name] today has no fevers, no emesis,  No chest pain,   - Patient's husband, mother and dad at bedside, questions answered - Back pain improving -No BM today - Reluctant to get out of bed with encouragement was able to get out of bed and get in the chair with assistance No fever  Or chills   Objective: Vitals:   01/21/24 2128 01/22/24 0539 01/22/24 0714 01/22/24 1338  BP: (!) 124/49 (!) 116/48  113/60  Pulse: 90 90  95  Resp: 17 18    Temp: 97.9 F (36.6 C) 98.1 F (36.7 C)  98.7 F (37.1 C)  TempSrc: Oral Axillary  Oral  SpO2: 100% 92% 98% 99%    Intake/Output Summary (Last 24 hours) at 01/22/2024 1909 Last data filed at 01/22/2024 1341 Gross per 24 hour  Intake 240 ml  Output 400 ml  Net -160 ml   Physical Exam  Gen:- Awake Alert, morbidly obese, no acute distress HEENT:- [place].AT, No sclera icterus Nose-[place] 5L/min Neck-Supple Neck,No JVD,.  Lungs-fair air  movement, no wheezing  CV- S1, S2 normal, regular  Abd-  +ve B.Sounds, Abd Soft, No tenderness, increased truncal adiposity Extremity/Skin:- No significant edema, pedal pulses present  Psych-affect is anxious, oriented x3 Neuro-generalized weakness no new focal deficits, no tremors MSK--point tenderness over the spinous processes of the lumbar area, no obvious deformity overlying skin without erythema warmth or streaking  Data Reviewed: I have personally reviewed following labs and imaging studies  CBC: Recent Labs  Lab 01/20/24 2255  WBC 7.3  NEUTROABS 4.6  HGB 9.1*  HCT 30.3*  MCV 97.1  PLT 407*   Basic Metabolic Panel: Recent Labs  Lab 01/20/24 2255 01/22/24 0431  NA 137 143  K 3.9 3.9  CL 97* 100  CO2 34* 37*  GLUCOSE 131* 116*  BUN 11 14  CREATININE 0.72 0.79  CALCIUM  8.5* 9.1  MG  --  2.3  PHOS  --  4.5   Radiology Studies: CT Lumbar Spine  Wo Contrast Result Date: 01/21/2024 CLINICAL DATA:  Lumbar compression fracture EXAM: CT LUMBAR SPINE WITHOUT CONTRAST TECHNIQUE: Multidetector CT imaging of the lumbar spine was performed without intravenous contrast administration. Multiplanar CT image reconstructions were also generated. RADIATION DOSE REDUCTION: This exam was performed according to the departmental dose-optimization program which includes automated exposure control, adjustment of the mA and/or kV according to patient size and/or use of iterative reconstruction technique. COMPARISON:  CT lumbar spine 12/18/2022 an MRI lumbar spine 01/03/2024 FINDINGS: Segmentation: 5 lumbar type vertebrae. Alignment: Normal. Vertebrae: There are chronic compression deformities of L3 and L4, unchanged since 01/03/2024. There is a new, mild wedge compression fracture of the L1 superior endplate with approximately 10% height loss. Paraspinal and other soft tissues: Calcific aortic atherosclerosis. Disc levels: No spinal canal stenosis. IMPRESSION: 1. New, mild wedge compression fracture  of the L1 superior endplate with approximately 10% height loss. 2. Chronic compression deformities of L3 and L4, unchanged since 01/03/2024. Aortic Atherosclerosis (ICD10-I70.0). Electronically Signed   By: Juanetta Nordmann M.D.   On: 01/21/2024 01:26   CT Chest Wo Contrast Result Date: 01/21/2024 CLINICAL DATA:  Pneumonia, complication suspected, xray done Pneumonia vs edema vs sarcoidosis on CXR EXAM: CT CHEST WITHOUT CONTRAST TECHNIQUE: Multidetector CT imaging of the chest was performed following the standard protocol without IV contrast. RADIATION DOSE REDUCTION: This exam was performed according to the departmental dose-optimization program which includes automated exposure control, adjustment of the mA and/or kV according to patient size and/or use of iterative reconstruction technique. COMPARISON:  Chest x-ray 01/20/2024, CT angio chest 08/05/2023, CT angio chest 09/11/2012 FINDINGS: Cardiovascular: Normal heart size. No significant pericardial effusion. The thoracic aorta is normal in caliber. Mild atherosclerotic plaque of the thoracic aorta. No coronary artery calcifications. Mitral annular calcification Mediastinum/Nodes: No gross hilar adenopathy, noting limited sensitivity for the detection of hilar adenopathy on this noncontrast study. No enlarged mediastinal or axillary lymph nodes. Thyroid  gland, trachea, and esophagus demonstrate no significant findings. Lungs/Pleura: Similar-appearing chronic bilateral upper lobe paramediastinal architectural distortion, bronchiectasis, peribronchovascular consolidation consistent with scarring. No focal consolidation. No pulmonary nodule. No pulmonary mass. No pleural effusion. No pneumothorax. Upper Abdomen: Status post cholecystectomy. Hepatic steatosis. Atrophic pancreas. Musculoskeletal: No chest wall abnormality. No suspicious lytic or blastic osseous lesions. No acute displaced fracture. Chronic T11 superior endplate compression fracture. IMPRESSION: 1. No  acute intrathoracic abnormality. 2. Similar-appearing chronic bilateral upper lobe paramediastinal architectural distortion, bronchiectasis, peribronchovascular consolidation consistent with scarring. 3. Hepatic steatosis. 4. Aortic Atherosclerosis (ICD10-I70.0) including mitral annular calcification. Electronically Signed   By: Morgane  Naveau M.D.   On: 01/21/2024 00:44   DG Chest Port 1 View Result Date: 01/20/2024 CLINICAL DATA:  Chest pain and difficulty breathing. EXAM: PORTABLE CHEST 1 VIEW COMPARISON:  Jan 03, 2024 FINDINGS: The cardiac silhouette is enlarged and unchanged in size. There is moderate to marked severity prominence of the pulmonary vasculature, increased in severity when compared to the prior study. Moderate to marked severity diffusely increased interstitial lung markings are also seen. There is moderate to marked severity airspace disease seen within the periphery of the mid to lower left lung. This is limited in evaluation secondary to overlying breast attenuation artifact. Biapical pleural thickening is noted. A small to moderate size left pleural effusion is also suspected. No pneumothorax is identified. Multilevel degenerative changes seen throughout the thoracic spine. IMPRESSION: 1. Cardiomegaly with moderate to marked severity pulmonary vascular congestion and interstitial edema. 2. Moderate to marked severity airspace disease within the periphery of the mid  to lower left lung, which may represent asymmetric pulmonary edema versus pneumonia. Further evaluation with nonemergent chest CT is recommended to further exclude the presence of an underlying neoplastic process. 3. Small to moderate size left pleural effusion. Electronically Signed   By: Virgle Grime M.D.   On: 01/20/2024 23:38   Scheduled Meds:  budesonide   0.5 mg Nebulization Daily   busPIRone   15 mg Oral TID   Chlorhexidine  Gluconate Cloth  6 each Topical Daily   cholecalciferol   1,000 Units Oral Daily    cyclobenzaprine   5 mg Oral TID   diclofenac  Sodium  4 g Topical QID   DULoxetine   60 mg Oral Daily   enoxaparin  (LOVENOX ) injection  40 mg Subcutaneous Q24H   ferrous sulfate   325 mg Oral Weekly   furosemide   20 mg Oral Q M,W,F   hydrocortisone   10 mg Oral Q24H   hydrocortisone   20 mg Oral Q breakfast   levothyroxine   50 mcg Oral Q0600   lidocaine   1 patch Transdermal Q24H   linaclotide   290 mcg Oral QAC breakfast   midodrine   5 mg Oral BID WC   pantoprazole   40 mg Oral BID AC   polyethylene glycol  17 g Oral BID   predniSONE   5 mg Oral q AM   rosuvastatin   20 mg Oral Daily   senna-docusate  2 tablet Oral QHS   tamsulosin   0.4 mg Oral QPC supper   Continuous Infusions:   LOS: 1 day   Colin Dawley M.D on 01/22/2024 at 7:09 PM  Go to [email or web address] - for contact info  [facility] - Office  [phone]  If 7PM-7AM, please contact night-coverage [email or web address] 01/22/2024, 7:09 PM

## 2024-01-23 ENCOUNTER — Encounter (HOSPITAL_COMMUNITY): Payer: Self-pay | Admitting: Internal Medicine

## 2024-01-23 ENCOUNTER — Other Ambulatory Visit (HOSPITAL_COMMUNITY): Payer: Self-pay

## 2024-01-23 ENCOUNTER — Other Ambulatory Visit: Payer: Self-pay

## 2024-01-23 DIAGNOSIS — E039 Hypothyroidism, unspecified: Secondary | ICD-10-CM | POA: Diagnosis not present

## 2024-01-23 DIAGNOSIS — J849 Interstitial pulmonary disease, unspecified: Secondary | ICD-10-CM | POA: Diagnosis not present

## 2024-01-23 DIAGNOSIS — F32A Depression, unspecified: Secondary | ICD-10-CM | POA: Diagnosis not present

## 2024-01-23 DIAGNOSIS — Z91199 Patient's noncompliance with other medical treatment and regimen due to unspecified reason: Secondary | ICD-10-CM | POA: Diagnosis not present

## 2024-01-23 DIAGNOSIS — S32048D Other fracture of fourth lumbar vertebra, subsequent encounter for fracture with routine healing: Secondary | ICD-10-CM | POA: Diagnosis not present

## 2024-01-23 DIAGNOSIS — M549 Dorsalgia, unspecified: Secondary | ICD-10-CM

## 2024-01-23 DIAGNOSIS — M797 Fibromyalgia: Secondary | ICD-10-CM | POA: Diagnosis not present

## 2024-01-23 DIAGNOSIS — E559 Vitamin D deficiency, unspecified: Secondary | ICD-10-CM | POA: Diagnosis not present

## 2024-01-23 DIAGNOSIS — K219 Gastro-esophageal reflux disease without esophagitis: Secondary | ICD-10-CM | POA: Diagnosis not present

## 2024-01-23 DIAGNOSIS — D869 Sarcoidosis, unspecified: Secondary | ICD-10-CM | POA: Diagnosis not present

## 2024-01-23 DIAGNOSIS — E271 Primary adrenocortical insufficiency: Secondary | ICD-10-CM

## 2024-01-23 DIAGNOSIS — F419 Anxiety disorder, unspecified: Secondary | ICD-10-CM | POA: Diagnosis not present

## 2024-01-23 DIAGNOSIS — G894 Chronic pain syndrome: Secondary | ICD-10-CM | POA: Diagnosis not present

## 2024-01-23 DIAGNOSIS — S32010A Wedge compression fracture of first lumbar vertebra, initial encounter for closed fracture: Secondary | ICD-10-CM | POA: Diagnosis not present

## 2024-01-23 DIAGNOSIS — R1310 Dysphagia, unspecified: Secondary | ICD-10-CM | POA: Diagnosis not present

## 2024-01-23 DIAGNOSIS — J9611 Chronic respiratory failure with hypoxia: Secondary | ICD-10-CM | POA: Diagnosis not present

## 2024-01-23 MED ORDER — BUSPIRONE HCL 15 MG PO TABS
15.0000 mg | ORAL_TABLET | Freq: Three times a day (TID) | ORAL | 4 refills | Status: DC
Start: 2024-01-23 — End: 2024-02-24
  Filled 2024-01-23: qty 90, 30d supply, fill #0

## 2024-01-23 MED ORDER — VITAMIN D (ERGOCALCIFEROL) 1.25 MG (50000 UNIT) PO CAPS
50000.0000 [IU] | ORAL_CAPSULE | ORAL | 5 refills | Status: DC
  Filled 2024-01-23: qty 5, 35d supply, fill #0

## 2024-01-23 MED ORDER — SENNOSIDES-DOCUSATE SODIUM 8.6-50 MG PO TABS
2.0000 | ORAL_TABLET | Freq: Every day | ORAL | 3 refills | Status: AC
  Filled 2024-01-23: qty 60, 30d supply, fill #0

## 2024-01-23 MED ORDER — PANTOPRAZOLE SODIUM 40 MG PO TBEC
40.0000 mg | DELAYED_RELEASE_TABLET | Freq: Every day | ORAL | 5 refills | Status: DC
  Filled 2024-01-23: qty 30, 30d supply, fill #0

## 2024-01-23 MED ORDER — LINZESS 72 MCG PO CAPS
72.0000 ug | ORAL_CAPSULE | Freq: Every day | ORAL | 3 refills | Status: DC
  Filled 2024-01-23: qty 30, 30d supply, fill #0

## 2024-01-23 MED ORDER — LACTULOSE 10 GM/15ML PO SOLN
10.0000 g | Freq: Every day | ORAL | 2 refills | Status: DC | PRN
  Filled 2024-01-23: qty 200, 13d supply, fill #0
  Filled 2024-01-23: qty 237, 15d supply, fill #0

## 2024-01-23 MED ORDER — POLYETHYLENE GLYCOL 3350 17 G PO PACK
17.0000 g | PACK | Freq: Two times a day (BID) | ORAL | 4 refills | Status: AC
  Filled 2024-01-23: qty 60, 30d supply, fill #0

## 2024-01-23 MED ORDER — ALBUTEROL SULFATE (2.5 MG/3ML) 0.083% IN NEBU
INHALATION_SOLUTION | RESPIRATORY_TRACT | 3 refills | Status: DC
  Filled 2024-01-23: qty 75, 30d supply, fill #0

## 2024-01-23 MED ORDER — IPRATROPIUM-ALBUTEROL 0.5-2.5 (3) MG/3ML IN SOLN
3.0000 mL | Freq: Three times a day (TID) | RESPIRATORY_TRACT | 11 refills | Status: DC
  Filled 2024-01-23: qty 270, 30d supply, fill #0

## 2024-01-23 MED ORDER — OXYCODONE HCL 15 MG PO TABS
15.0000 mg | ORAL_TABLET | ORAL | 0 refills | Status: DC | PRN
  Filled 2024-01-23: qty 45, 8d supply, fill #0

## 2024-01-23 MED ORDER — DULOXETINE HCL 60 MG PO CPEP
60.0000 mg | ORAL_CAPSULE | Freq: Every day | ORAL | 5 refills | Status: DC
  Filled 2024-01-23: qty 30, 30d supply, fill #0

## 2024-01-23 MED ORDER — BUDESONIDE 0.5 MG/2ML IN SUSP
RESPIRATORY_TRACT | 3 refills | Status: DC
  Filled 2024-01-23: qty 360, 90d supply, fill #0

## 2024-01-23 MED ORDER — CYCLOBENZAPRINE HCL 5 MG PO TABS
5.0000 mg | ORAL_TABLET | Freq: Three times a day (TID) | ORAL | 2 refills | Status: DC
Start: 2024-01-23 — End: 2024-02-24
  Filled 2024-01-23: qty 90, 30d supply, fill #0

## 2024-01-23 NOTE — Care Management Important Message (Signed)
 Important Message  Patient Details  Name: Kathleen [name] MRN: [id number] Date of Birth: [birth year]   Important Message Given:  Yes - Medicare IM     [name] L [name] 01/23/2024, 11:45 AM

## 2024-01-23 NOTE — Discharge Summary (Signed)
 Kathleen [name], is a 63 y.o. female  DOB Dec 05, 1960  MRN [id number].  Admission date:  01/20/2024  Admitting Physician  Arnulfo Larch, MD  Discharge Date:  01/23/2024   Primary MD  Bennet Brasil, MD  Recommendations for primary care physician for things to follow:  1)Follow up with your pulmonologist Dr. Jolyne Needs--- for preoperative clearance from a pulmonology standpoint so you can have Dr. Mort Ards schedule your kyphoplasty procedure 2)Please follow-up with Dr. Madelene Schanz further evaluation regarding possible kyphoplasty procedure after you get preoperative clearance from your pulmonologist Dr. Rhesa Celeste 3) please call your pain clinic provider at at the [facility] clinic----for ongoing pain medication prescriptions 4) physical therapy will come to your house to help you with your rehab 5) please continue to get out of bed and ambulate with a walker daily, please use incentive spirometry as advised 6) please follow-up with endocrinologist Dr. Frann Ivans, MD, Endocrinology, Diabetes & Metabolism--to schedule your outpatient bone density test and discuss possible treatment for osteoporosis including options like Prolia  at [facility], [address] [place], [place] [address], Phone Number- tel:[phone] 7)Very Low-salt diet advised---Less than 2 gm of Sodium per day advised----ok to use Mrs DASH salt substitute instead of Salt   Admission Diagnosis  Atypical chest pain [R07.89] Lumbar compression fracture (HCC) [S32.000A] Chronic low back pain without sciatica, unspecified back pain laterality [M54.50, G89.29] Compression fracture of L1 vertebra, initial encounter [facility]) [S32.010A]   Discharge Diagnosis  Atypical chest pain [R07.89] Lumbar compression fracture (HCC) [S32.000A] Chronic low back pain without sciatica, unspecified back pain laterality [M54.50, G89.29] Compression  fracture of L1 vertebra, initial encounter (HCC) [S32.010A]    Principal Problem:   Acute L1 and Subacute L3/L4 Lumbar compression fracture (HCC) Active Problems:   Interstitial lung disease (HCC)   Chronic respiratory failure with hypoxia (HCC)   Sarcoidosis   Avascular necrosis of bone of hip, left (HCC)   Hypothyroidism   Personal history of noncompliance with medical treatment, presenting hazards to health   GERD (gastroesophageal reflux disease)   Vitamin D  deficiency   Adrenal insufficiency (Addison's disease) (HCC)   Depression, major, single episode, moderate (HCC)   Intractable back pain      Past Medical History:  Diagnosis Date   Adrenal insufficiency (HCC)    Anemia    Anxiety    Avascular bone necrosis (HCC)    Breast fibrocystic disorder    Chronic pain    Encounter for long-term opiate analgesic use 02/02/2020   Patient has chronic pain with femoral head avascular necrosis.  Is under pain contract   Fibromyalgia    Gastroesophageal reflux disease    Hypothyroidism    Mitral valve prolapse    Orthostatic hypotension    Peripheral neuropathy    PONV (postoperative nausea and vomiting)    Restrictive lung disease    Moderate to severe   Sarcoidosis    Biopsy proven - [facility]   Sarcoidosis    SOB (shortness of breath)    chronic  Past Surgical History:  Procedure Laterality Date   BIOPSY  10/17/2018   Procedure: BIOPSY;  Surgeon: Ruby Corporal, MD;  [facility];  Service: Endoscopy;;  duodenum, antrum, gastric body   BIOPSY  03/03/2021   Procedure: BIOPSY;  Surgeon: Ruby Corporal, MD;  [facility];  Service: Endoscopy;;   BIOPSY  09/11/2023   Procedure: BIOPSY;  Surgeon: Suzette Espy, MD;  [facility];  Service: Endoscopy;;   BREAST LUMPECTOMY Bilateral 01/12/2011   CHOLECYSTECTOMY N/A 04/17/2021   Procedure: LAPAROSCOPIC CHOLECYSTECTOMY;  Surgeon: Alanda Allegra, MD;  Location: [facility];  Service: General;   Laterality: N/A;   COLONOSCOPY WITH PROPOFOL  N/A 01/03/2022   Procedure: COLONOSCOPY WITH PROPOFOL ;  Surgeon: Ruby Corporal, MD;  [facility]: [facility];  Service: Endoscopy;  Laterality: N/A;  220   ESOPHAGEAL DILATION N/A 03/03/2021   Procedure: ESOPHAGEAL DILATION;  Surgeon: Ruby Corporal, MD;  [facility];  Service: Endoscopy;  Laterality: N/A;   ESOPHAGOGASTRODUODENOSCOPY (EGD) WITH PROPOFOL  N/A 10/17/2018   Procedure: ESOPHAGOGASTRODUODENOSCOPY (EGD) WITH PROPOFOL ;  Surgeon: Ruby Corporal, MD;  [facility];  Service: Endoscopy;  Laterality: N/A;  2:25   ESOPHAGOGASTRODUODENOSCOPY (EGD) WITH PROPOFOL  N/A 03/03/2021   Procedure: ESOPHAGOGASTRODUODENOSCOPY (EGD) WITH PROPOFOL ;  Surgeon: Ruby Corporal, MD;  [facility];  Service: Endoscopy;  Laterality: N/A;  12:15   ESOPHAGOGASTRODUODENOSCOPY (EGD) WITH PROPOFOL  N/A 09/11/2023   Procedure: ESOPHAGOGASTRODUODENOSCOPY (EGD) WITH PROPOFOL ;  Surgeon: Suzette Espy, MD;  [facility];  Service: Endoscopy;  Laterality: N/A;  2:30 pm, asa 3, LM to see if pt can come earlier   [facility] DILATION N/A 09/11/2023   Procedure: Londa Rival DILATION;  Surgeon: Suzette Espy, MD;  [facility];  Service: Endoscopy;  Laterality: N/A;   ORIF ANKLE FRACTURE Left 03/10/2023   Procedure: OPEN REDUCTION INTERNAL FIXATION (ORIF) ANKLE FRACTURE;  Surgeon: Diedra Fowler, MD;  Location: [facility];  Service: Orthopedics;  Laterality: Left;   POLYPECTOMY  01/03/2022   Procedure: POLYPECTOMY INTESTINAL;  Surgeon: Ruby Corporal, MD;  [facility];  Service: Endoscopy;;   TUBAL LIGATION       HPI  from the history and physical done on the day of admission:   HPI:  Kathleen [name] is a 63 y.o. female with hx of chronic respiratory failure on 5 L, interstitial lung disease, sarcoidosis, Addison's disease, hypothyroidism, HLD, morbid obesity, osteoporosis with vertebral compression fractures, AVN  hips, urinary retention, chronic pain syndrome, chronic anemia, anxiety and depression, who presents with acute worsening of lower back pain. Reports she has been nonambulatory for about the past month (since her last SNF stay). About 3 days ago was just moving her legs and felt a pop in her back with acute worsening of pain. Pain is severe with any movement and she is screaming out in pain intermittently during interview. She reports having feeling like her leg is going to sleep sometimes, but does not seem that she has true numbness or weakness in LE, or incontinence. Pain uncontrolled despite her home oxycodone . Follows with Dr. [name] of [facility] and will need clearance by pulmonology to be considered for kyphoplasty.      Review of Systems:  ROS complete and negative except as marked above     Hospital Course:     Brief Summary Zeidy Tayag Parrott is a 63 y.o. female with a history of chronic hypoxic respiratory  failure (5L O2), ILD, sarcoidosis, Addison's disease, morbid obesity, bilateral hip AVN, osteoporosis with multiple vertebral fractures on chronic narcotics, and recent admissions for intractable pain due to compression fractures of L4 (4/12-4/16) and then L3 (5/9-5/14), subsequently discharged to SNF, then to home once she entered into copayment days with her insurance and presented to the ED on 01/20/2024 with chest pain and ruled out for ACS, also then complained of back pain, found to have a new L1 compression fracture. Hypothyroidism, HLD, morbid obesity, osteoporosis with vertebral compression fractures, AVN hips, urinary retention, chronic pain syndrome, chronic anemia, anxiety and depression admitted on 01/21/2024 with acute L1 fracture resulting in acute low back pain   -Assessment and Plan: 1)Acute L1 compression fracture--pain control improving, reduce IV Dilaudid  to 1 mg from 2 mg --Encourage patient to use oral pain control more, rather than IV -Subacute L3 and L4  compression fractures- - Physical therapist recommends SNF - Patient declines, SNF patient would like to go home with physical therapy and aide services -Oxycodone  15 mg tablets prescribed for patient for up to week -Patient is strongly advised to follow-up with pain clinic specialist at [facility] clinic for refills of her pain medication going forward   2)Acute compression fracture L1  Hx chronic pain with recent L3/4 compression fractures  Clinical diagnosis osteoporosis  Debility  Essentially atraumatic fracture reports moving legs and hearing pop in her lower back with acute worsening of pain. CT L spine with new, mild wedge compression fracture of the L1 superior endplate with approximately 10% height loss.  - Symptomatic management with multimodal pain control Tylenol  prn mild, adjuncts heat, lidocaine , diclofenac  gel, Flexeril for spasm, oxycodone  as needed for moderate/severe pain, -treated with IV Dilaudid , transition to oral agents - Has TLSO brace but per recent documentation no longer fits patient.  - Strongly advised patient to contact orthospine clinic for refitting.  Recommend TLSO when up out of bed   3)Vit D Deficiency---replace, follow-up with Dr. Nida Gebreselassie as outpatient   4)Osteopenia----?? Osteoporosis in the setting of chronic prednisone  use -Bone density test from 02/23/2020 showed T-score of -2.3 (osteopenic range) - Calcium ,  vitamin D  and calcitonin advised as above - Follow-up with Dr. Nida Gebreselassie for repeat bone density test and to discuss possible treatment osteoporosis including Prolia   5)Morbid Obesity- -- Recommend weight loss to help offload her back, consider GLP-1, bariatric surgery referral. -Low calorie diet, portion control and increase physical activity discussed with patient -Body mass index is 42.93 kg/m.   Chronic medical problems: Sarcoidosis, ILD, CHRF on 5 L O2: Follows with outpatient pulmonology-Dr Jolyne Needs at [facility]. Continue  home prednisone  5 mg for sarcoidosis/ILD, incentive spirometry, nebs as needed Addison's disease: Continue home hydrocortisone  Chronic hypotension: Continue home midodrine  5 mg twice daily Hyperlipidemia: Continue home rosuvastatin  Hypothyroidism: Continue home levothyroxine  AVN hips: Noted Urinary retention: Continue on tamsulosin  Chronic anemia: Stable around 9, defer additional evaluation to outpatient. Acute on Chronic pain syndrome: See acute pain management above Anxiety and depression: Continue home buspirone , cymbalta , prn atarax    Disposition: The patient is from: Home              Anticipated d/c is to: Home  Discharge Condition: stable  Follow UP--- Dr. Mort Ards from orthospine for possible kyphoplasty procedure, Dr. Jolyne Needs pulmonologist at [facility] for preop clearance, Dr. Nida Gebreselassie for osteoporosis and Addison's disease management  Diet and Activity recommendation:  As advised  Discharge Instructions   Discharge Instructions  Call MD for:  difficulty breathing, headache or visual disturbances   Complete by: As directed    Call MD for:  persistant dizziness or light-headedness   Complete by: As directed    Call MD for:  persistant nausea and vomiting   Complete by: As directed    Call MD for:  temperature >100.4   Complete by: As directed    Diet - low sodium heart healthy   Complete by: As directed    Discharge instructions   Complete by: As directed    1)Follow up with your pulmonologist Dr. Jolyne Needs--- for preoperative clearance from a pulmonology standpoint so you can have Dr. Mort Ards schedule your kyphoplasty procedure 2)Please follow-up with Dr. Madelene Schanz further evaluation regarding possible kyphoplasty procedure after you get preoperative clearance from your pulmonologist Dr. Rhesa Celeste 3) please call your pain clinic provider at at the [facility] clinic----for ongoing pain medication prescriptions 4) physical therapy  will come to your house to help you with your rehab 5) please continue to get out of bed and ambulate with a walker daily, please use incentive spirometry as advised 6) please follow-up with endocrinologist Dr. Frann Ivans, MD, Endocrinology, Diabetes & Metabolism--to schedule your outpatient bone density test and discuss possible treatment for osteoporosis including options like Prolia  at [facility], [address] [place], [place] [address], Phone Number- [phone] 7)Very Low-salt diet advised---Less than 2 gm of Sodium per day advised----ok to use Mrs DASH salt substitute instead of Salt  8)Can contact orthospine clinic for for refitting TLSO brace.  Recommend TLSO when up out of bed   Increase activity slowly   Complete by: As directed          Discharge Medications     Allergies as of 01/23/2024       Reactions   Bee Venom Anaphylaxis   Contrast Media [iodinated Contrast Media] Other (See Comments)   CT chest w/ contrast at OSH followed by SOB resolved w/ single dose steroids, never ENT swelling, intubation or pressors, has had multiple contrasted scans since   Morphine  Other (See Comments)   Substance with morphinan structure and opioid receptor agonist mechanism of action (substance)   Bactrim  [sulfamethoxazole -trimethoprim ] Nausea And Vomiting   Codeine Itching        Medication List     STOP taking these medications    docusate sodium  100 MG capsule Commonly known as: COLACE   methocarbamol  750 MG tablet Commonly known as: ROBAXIN    oxyCODONE -acetaminophen  10-325 MG tablet Commonly known as: PERCOCET   SOLU-Medrol  (PF) 125 MG injection ACT-O-VIAL Generic drug: methylPREDNISolone  sodium succinate        TAKE these medications    Acetaminophen  Extra Strength 500 MG Tabs Take 2 tablets (1,000 mg total) by mouth 3 (three) times daily.   albuterol  (2.5 MG/3ML) 0.083% nebulizer solution Commonly known as: PROVENTIL  Inhale 3 mL (2.5 mg total) by  nebulization every four (4) hours as needed for wheezing or shortness of breath.   budesonide  0.5 MG/2ML nebulizer solution Commonly known as: PULMICORT  Inhale the contents of 1 vial via nebulizer two (2) times a day.   busPIRone  15 MG tablet Commonly known as: BUSPAR  Take 1 tablet (15 mg) by mouth 3 times daily.   calcitonin (salmon) 200 UNIT/ACT nasal spray Commonly known as: MIACALCIN /FORTICAL Use 1 spray in one nostril per day, alternate nostrils every other day (may have 1 vial she should use this for at least 2 weeks but may use as long  as 4 weeks)   cyclobenzaprine 5 MG tablet Commonly known as: FLEXERIL Take 1 tablet (5 mg total) by mouth 3 (three) times daily.   DULoxetine  60 MG capsule Commonly known as: CYMBALTA  Take 1 capsule (60 mg total) by mouth daily. What changed: when to take this   EPINEPHrine  0.3 mg/0.3 mL Soaj injection Commonly known as: EPI-PEN Inject 0.3 mg into the muscle as needed for anaphylaxis.   ferrous sulfate  325 (65 FE) MG EC tablet Take 325 mg by mouth once a week.   furosemide  20 MG tablet Commonly known as: LASIX  Take 1 tablet (20 mg total) by mouth every Monday, Wednesday, and Friday.   hydrocortisone  10 MG tablet Commonly known as: CORTEF  Take 2 tablets by mouth at 8:00 am and 1 tablet at lunch. (May double dose on sick days x 3-5 days)   hydrOXYzine  25 MG tablet Commonly known as: ATARAX  Take 1 tablet (25 mg total) by mouth 3 (three) times daily as needed for anxiety.   ipratropium-albuterol  0.5-2.5 (3) MG/3ML Soln Commonly known as: DUONEB Take 3 mLs by nebulization every 8 (eight) hours.   lactulose  10 GM/15ML solution Commonly known as: CHRONULAC  Take 15 mLs (10 g total) by mouth daily as needed for mild constipation. What changed: when to take this   levothyroxine  50 MCG tablet Commonly known as: SYNTHROID  Take 1 tablet by mouth daily before breakfast.   lidocaine  5 % Commonly known as: LIDODERM  Place 1 patch onto the  skin daily. Remove & Discard patch within 12 hours or as directed by MD   Linzess  72 MCG capsule Generic drug: linaclotide  Take 1 capsule (72 mcg total) by mouth daily before breakfast.   midodrine  5 MG tablet Commonly known as: PROAMATINE  Take 1 tablet (5 mg total) by mouth 2 (two) times daily with a meal.   ondansetron  8 MG disintegrating tablet Commonly known as: ZOFRAN -ODT Dissolve 1 tablet (8 mg total) by mouth every 8 (eight) hours as needed. What changed: reasons to take this   oxyCODONE  15 MG immediate release tablet Commonly known as: ROXICODONE  Take 1 tablet (15 mg total) by mouth every 4 (four) hours as needed for moderate pain (pain score 4-6) or severe pain (pain score 7-10).   OXYGEN  Inhale 5 L into the lungs continuous.   pantoprazole  40 MG tablet Commonly known as: PROTONIX  Take 1 tablet (40 mg total) by mouth daily. What changed: when to take this   polyethylene glycol 17 g packet Commonly known as: MIRALAX  / GLYCOLAX  Take 17 g by mouth 2 (two) times daily.   predniSONE  5 MG tablet Commonly known as: DELTASONE  Take 1 tablet (5 mg total) by mouth in the morning.   rosuvastatin  20 MG tablet Commonly known as: Crestor  Take 1 tablet (20 mg total) by mouth daily.   senna-docusate 8.6-50 MG tablet Commonly known as: Senokot-S Take 2 tablets by mouth at bedtime.   tamsulosin  0.4 MG Caps capsule Commonly known as: FLOMAX  Take 1 capsule (0.4 mg total) by mouth daily after supper.   Vitamin D  (Ergocalciferol ) 1.25 MG (50000 UNIT) Caps capsule Commonly known as: DRISDOL  Take 1 capsule (50,000 Units total) by mouth every Sunday. Start taking on: January 26, 2024        Major procedures and Radiology Reports - PLEASE review detailed and final reports for all details, in brief -   CT Lumbar Spine Wo Contrast Result Date: 01/21/2024 CLINICAL DATA:  Lumbar compression fracture EXAM: CT LUMBAR SPINE WITHOUT CONTRAST TECHNIQUE: Multidetector CT imaging  of the  lumbar spine was performed without intravenous contrast administration. Multiplanar CT image reconstructions were also generated. RADIATION DOSE REDUCTION: This exam was performed according to the departmental dose-optimization program which includes automated exposure control, adjustment of the mA and/or kV according to patient size and/or use of iterative reconstruction technique. COMPARISON:  CT lumbar spine 12/18/2022 an MRI lumbar spine 01/03/2024 FINDINGS: Segmentation: 5 lumbar type vertebrae. Alignment: Normal. Vertebrae: There are chronic compression deformities of L3 and L4, unchanged since 01/03/2024. There is a new, mild wedge compression fracture of the L1 superior endplate with approximately 10% height loss. Paraspinal and other soft tissues: Calcific aortic atherosclerosis. Disc levels: No spinal canal stenosis. IMPRESSION: 1. New, mild wedge compression fracture of the L1 superior endplate with approximately 10% height loss. 2. Chronic compression deformities of L3 and L4, unchanged since 01/03/2024. Aortic Atherosclerosis (ICD10-I70.0). Electronically Signed   By: Juanetta Nordmann M.D.   On: 01/21/2024 01:26   CT Chest Wo Contrast Result Date: 01/21/2024 CLINICAL DATA:  Pneumonia, complication suspected, xray done Pneumonia vs edema vs sarcoidosis on CXR EXAM: CT CHEST WITHOUT CONTRAST TECHNIQUE: Multidetector CT imaging of the chest was performed following the standard protocol without IV contrast. RADIATION DOSE REDUCTION: This exam was performed according to the departmental dose-optimization program which includes automated exposure control, adjustment of the mA and/or kV according to patient size and/or use of iterative reconstruction technique. COMPARISON:  Chest x-ray 01/20/2024, CT angio chest 08/05/2023, CT angio chest 09/11/2012 FINDINGS: Cardiovascular: Normal heart size. No significant pericardial effusion. The thoracic aorta is normal in caliber. Mild atherosclerotic plaque of the  thoracic aorta. No coronary artery calcifications. Mitral annular calcification Mediastinum/Nodes: No gross hilar adenopathy, noting limited sensitivity for the detection of hilar adenopathy on this noncontrast study. No enlarged mediastinal or axillary lymph nodes. Thyroid  gland, trachea, and esophagus demonstrate no significant findings. Lungs/Pleura: Similar-appearing chronic bilateral upper lobe paramediastinal architectural distortion, bronchiectasis, peribronchovascular consolidation consistent with scarring. No focal consolidation. No pulmonary nodule. No pulmonary mass. No pleural effusion. No pneumothorax. Upper Abdomen: Status post cholecystectomy. Hepatic steatosis. Atrophic pancreas. Musculoskeletal: No chest wall abnormality. No suspicious lytic or blastic osseous lesions. No acute displaced fracture. Chronic T11 superior endplate compression fracture. IMPRESSION: 1. No acute intrathoracic abnormality. 2. Similar-appearing chronic bilateral upper lobe paramediastinal architectural distortion, bronchiectasis, peribronchovascular consolidation consistent with scarring. 3. Hepatic steatosis. 4. Aortic Atherosclerosis (ICD10-I70.0) including mitral annular calcification. Electronically Signed   By: Morgane  Naveau M.D.   On: 01/21/2024 00:44   DG Chest Port 1 View Result Date: 01/20/2024 CLINICAL DATA:  Chest pain and difficulty breathing. EXAM: PORTABLE CHEST 1 VIEW COMPARISON:  Jan 03, 2024 FINDINGS: The cardiac silhouette is enlarged and unchanged in size. There is moderate to marked severity prominence of the pulmonary vasculature, increased in severity when compared to the prior study. Moderate to marked severity diffusely increased interstitial lung markings are also seen. There is moderate to marked severity airspace disease seen within the periphery of the mid to lower left lung. This is limited in evaluation secondary to overlying breast attenuation artifact. Biapical pleural thickening is noted.  A small to moderate size left pleural effusion is also suspected. No pneumothorax is identified. Multilevel degenerative changes seen throughout the thoracic spine. IMPRESSION: 1. Cardiomegaly with moderate to marked severity pulmonary vascular congestion and interstitial edema. 2. Moderate to marked severity airspace disease within the periphery of the mid to lower left lung, which may represent asymmetric pulmonary edema versus pneumonia. Further evaluation with nonemergent chest CT is recommended  to further exclude the presence of an underlying neoplastic process. 3. Small to moderate size left pleural effusion. Electronically Signed   By: Virgle Grime M.D.   On: 01/20/2024 23:38   MR LUMBAR SPINE WO CONTRAST Result Date: 01/03/2024 CLINICAL DATA:  Initial evaluation for acute low back pain, injury earlier today. EXAM: MRI LUMBAR SPINE WITHOUT CONTRAST TECHNIQUE: Multiplanar, multisequence MR imaging of the lumbar spine was performed. No intravenous contrast was administered. COMPARISON:  Prior study from 12/07/2023 FINDINGS: Segmentation: Transitional lumbosacral anatomy with partial sacralization of the L5 vertebral body on the left. Same numbering system employed as on previous exam. Alignment: Vertebral bodies normally aligned with preservation of the normal cervical lordosis. No significant interval listhesis or malalignment. Vertebrae: Previously identified acute to subacute compression fracture involving the superior endplate of L4 again seen. Associated height loss is relatively stable from prior persistent marrow edema without significant progressive height loss since prior. Associated 3 mm bony retropulsion, similar. There is a new acute compression fracture involving the superior endplate of L3. Associated height loss measures up to 30% centrally with no more than trace 2 mm retropulsion. This is benign/mechanical in appearance. Vertebral body height otherwise maintained. Bone marrow signal  intensity within normal limits. No worrisome osseous lesions. Mild reactive edema about the S2 and S3 segments without fracture deformity, stable. Conus medullaris and cauda equina: Conus extends to the T12-L1 level. Conus and cauda equina appear normal. Paraspinal and other soft tissues: Paraspinous soft tissues demonstrate no acute finding. Subcentimeter T2 hyperintense cyst at the lower pole of the left kidney, benign in appearance, no follow-up imaging recommended. Disc levels: T11-12: Seen only on sagittal projection. Disc desiccation with small central disc protrusion. No stenosis. T12-L1: Unremarkable. L1-2: Unremarkable. L2-3: Acute compression fracture involving the superior endplate of L3 with no more than trace 2 mm bony retropulsion. Mild facet hypertrophy. No significant spinal stenosis. Foramina remain patent. L3-4: Disc desiccation without significant disc bulge. 3 mm bony retropulsion related to the acute to subacute L4 compression fracture. Mild bilateral facet hypertrophy. Resultant mild narrowing of the lateral recesses bilaterally. Central canal remains patent. No significant foraminal stenosis. L4-5: Disc desiccation with minimal annular bulge. Mild facet hypertrophy. Epidural lipomatosis. No significant canal or foraminal stenosis. L5-S1: Normal interspace. Epidural lipomatosis. No canal or foraminal stenosis. IMPRESSION: 1. Acute compression fracture involving the superior endplate of L3 with up to 30% height loss and trace 2 mm bony retropulsion. No significant stenosis. This is new as compared to recent MRI from 12/07/2023. 2. Acute to subacute compression fracture involving the superior endplate of L4 with associated 3 mm bony retropulsion, not significantly changed from prior. 3. Underlying mild for age degenerative spondylosis without significant stenosis or neural impingement. Electronically Signed   By: Virgia Griffins M.D.   On: 01/03/2024 20:06   DG Chest Port 1 View Result  Date: 01/03/2024 CLINICAL DATA:  Patient presents after "twisting her back today". EXAM: PORTABLE CHEST 1 VIEW COMPARISON:  August 05, 2023 FINDINGS: Limited study secondary to overlying breast attenuation artifact. The cardiac silhouette is limited in evaluation but appears to be stable. There is mild to moderate severity prominence of the central pulmonary vasculature with mild diffusely increased interstitial lung markings. No pleural effusion or pneumothorax is identified. The visualized skeletal structures are unremarkable. IMPRESSION: 1. Limited study secondary to overlying breast attenuation artifact. 2. Pulmonary vascular congestion. A component of superimposed interstitial edema cannot be excluded. Electronically Signed   By: Virgle Grime M.D.   On:  01/03/2024 19:20   Today   Subjective    Mitchelle Cahue today has no new complaints No fever  Or chills   No Nausea, Vomiting or Diarrhea Husband at bedside, questions answered - Pain control improving          Patient has been seen and examined prior to discharge   Objective   Blood pressure (!) 131/53, pulse 86, temperature 98.4 F (36.9 C), resp. rate 16, weight 135.7 kg, last menstrual period 05/17/2016, SpO2 100%.   Intake/Output Summary (Last 24 hours) at 01/23/2024 1432 Last data filed at 01/23/2024 0355 Gross per 24 hour  Intake 480 ml  Output 300 ml  Net 180 ml    Exam Gen:- Awake Alert, no acute distress , morbidly obese, HEENT:- [place].AT, No sclera icterus Nose- [place] 5L/min Neck-Supple Neck,No JVD,.  Lungs-fair air movement, no significant wheezing  CV- S1, S2 normal, regular Abd-  +ve B.Sounds, Abd Soft, No tenderness, increased truncal adiposity noted Extremity/Skin:- No  edema,   good pulses Psych-affect is somewhat anxious from time to time,, oriented x3 Neuro-generalized weakness, no new focal deficits, no tremors  MSK--point tenderness over the spinous processes of the lumbar area, no obvious deformity  overlying skin without erythema warmth or streaking    Data Review   CBC w Diff:  Lab Results  Component Value Date   WBC 7.3 01/20/2024   HGB 9.1 (L) 01/20/2024   HGB 12.6 08/07/2018   HCT 30.3 (L) 01/20/2024   HCT 36.9 08/07/2018   PLT 407 (H) 01/20/2024   PLT 359 08/07/2018   LYMPHOPCT 17 01/20/2024   MONOPCT 14 01/20/2024   EOSPCT 2 01/20/2024   BASOPCT 1 01/20/2024    CMP:  Lab Results  Component Value Date   NA 143 01/22/2024   NA 139 10/25/2023   K 3.9 01/22/2024   CL 100 01/22/2024   CO2 37 (H) 01/22/2024   BUN 14 01/22/2024   BUN 12 10/25/2023   CREATININE 0.79 01/22/2024   CREATININE 0.85 08/26/2014   PROT 6.3 10/25/2023   ALBUMIN 4.3 10/25/2023   BILITOT <0.2 10/25/2023   ALKPHOS 80 10/25/2023   AST 15 10/25/2023   ALT 31 10/25/2023  .  Total Discharge time is about 33 minutes  Colin Dawley M.D on 01/23/2024 at 2:32 PM  Go to [email or web address] -  for contact info  [facility] - Office  [phone]

## 2024-01-23 NOTE — Discharge Instructions (Addendum)
 1)Follow up with your pulmonologist Dr. Jolyne Needs--- for preoperative clearance from a pulmonology standpoint so you can have Dr. Mort Ards schedule your kyphoplasty procedure 2)Please follow-up with Dr. Madelene Schanz further evaluation regarding possible kyphoplasty procedure after you get preoperative clearance from your pulmonologist Dr. Rhesa Celeste 3) please call your pain clinic provider at at the [facility] clinic----for ongoing pain medication prescriptions 4) physical therapy will come to your house to help you with your rehab 5) please continue to get out of bed and ambulate with a walker daily, please use incentive spirometry as advised 6) please follow-up with endocrinologist Dr. Frann Ivans, MD, Endocrinology, Diabetes & Metabolism--to schedule your outpatient bone density test and discuss possible treatment for osteoporosis including options like Prolia  at [facility], [address] [place], [place] [address], Phone Number- [phone] 7)Very Low-salt diet advised---Less than 2 gm of Sodium per day advised----ok to use Mrs DASH salt substitute instead of Salt  8)Can contact orthospine clinic for for refitting TLSO brace.  Recommend TLSO when up out of bed

## 2024-01-23 NOTE — Progress Notes (Signed)
Discharge instructions reviewed with patient and husband. Both verbalized understanding of instructions.  Patient discharged home with husband in stable condition.   

## 2024-01-23 NOTE — Progress Notes (Signed)
 Mobility Specialist Progress Note:    01/23/24 1300  Mobility  Activity Ambulated with assistance in room;Transferred to/from [facility]  Level of Assistance Moderate assist, patient does 50-74%  Assistive Device Front wheel walker  Distance Ambulated (ft) 6 ft  Range of Motion/Exercises Active;All extremities  Activity Response Tolerated well  Mobility visit 1 Mobility  Mobility Specialist Start Time (ACUTE ONLY) 1250  Mobility Specialist Stop Time (ACUTE ONLY) 1305  Mobility Specialist Time Calculation (min) (ACUTE ONLY) 15 min   Pt received requesting assistance to [facility]. Required ModA to stand and ambulate with RW. Tolerated well, c/o back pain. Left pt with spouse, all needs met.   Glinda Lapping Mobility Specialist Please contact via Special educational needs teacher or  Rehab office at [phone]

## 2024-01-23 NOTE — Plan of Care (Signed)

## 2024-01-23 NOTE — TOC Transition Note (Signed)
 Transition of Care [facility]) - Discharge Note   Patient Details  Name: Kathleen [name] MRN: [id number] Date of Birth: 1961/02/02  Transition of Care [facility] CM/SW Contact:  Orelia Binet, RN Phone Number: 01/23/2024, 2:12 PM   Clinical Narrative:   Patient is discharging home with [facility] home health. Husband at the bedside to discuss plan. He will go to social services to discuss medicaid. They need more care in the home. Patient has all equipment needed.    Final next level of care: Home w Home Health Services Barriers to Discharge: Barriers Resolved   Patient Goals and CMS Choice Patient states their goals for this hospitalization and ongoing recovery are:: return home CMS Medicare.gov Compare Post Acute Care list provided to:: Patient      Discharge Placement                 Name of family member notified: Husband Patient and family notified of of transfer: 01/23/24  Discharge Plan and Services Additional resources added to the After Visit Summary for            Social Drivers of Health (SDOH) Interventions SDOH Screenings   Food Insecurity: No Food Insecurity (01/21/2024)  Housing: Low Risk  (01/21/2024)  Transportation Needs: No Transportation Needs (01/21/2024)  Utilities: Not At Risk (01/21/2024)  Alcohol Screen: Low Risk  (09/27/2023)  Depression (PHQ2-9): Low Risk  (01/01/2024)  Financial Resource Strain: Low Risk  (09/27/2023)  Physical Activity: Inactive (09/27/2023)  Social Connections: Socially Integrated (01/21/2024)  Stress: No Stress Concern Present (09/27/2023)  Tobacco Use: Low Risk  (01/21/2024)  Health Literacy: Medium Risk (12/20/2023)   Received from [facility]     Readmission Risk Interventions    12/11/2023   11:11 AM 04/08/2023   10:38 AM  Readmission Risk Prevention Plan  Transportation Screening Complete Complete  HRI or Home Care Consult Complete   Social Work Consult for Recovery Care Planning/Counseling Complete   Palliative Care  Screening Not Applicable   Medication Review Oceanographer) Complete Complete  HRI or Home Care Consult  Complete  SW Recovery Care/Counseling Consult  Complete  Palliative Care Screening  Not Applicable  Skilled Nursing Facility  Not Applicable

## 2024-01-24 ENCOUNTER — Other Ambulatory Visit (HOSPITAL_COMMUNITY): Payer: Self-pay

## 2024-01-25 ENCOUNTER — Encounter (HOSPITAL_COMMUNITY): Payer: Self-pay

## 2024-01-25 ENCOUNTER — Other Ambulatory Visit: Payer: Self-pay

## 2024-01-25 ENCOUNTER — Inpatient Hospital Stay (HOSPITAL_COMMUNITY)
Admission: EM | Admit: 2024-01-25 | Discharge: 2024-02-06 | DRG: 543 | Disposition: A | Discharge status: Rehabilitation Facility | Attending: Student | Admitting: Student

## 2024-01-25 ENCOUNTER — Emergency Department (HOSPITAL_COMMUNITY)

## 2024-01-25 ENCOUNTER — Other Ambulatory Visit (HOSPITAL_COMMUNITY): Payer: Self-pay

## 2024-01-25 DIAGNOSIS — K219 Gastro-esophageal reflux disease without esophagitis: Secondary | ICD-10-CM | POA: Diagnosis not present

## 2024-01-25 DIAGNOSIS — M5440 Lumbago with sciatica, unspecified side: Secondary | ICD-10-CM | POA: Diagnosis not present

## 2024-01-25 DIAGNOSIS — Z9181 History of falling: Secondary | ICD-10-CM

## 2024-01-25 DIAGNOSIS — J849 Interstitial pulmonary disease, unspecified: Secondary | ICD-10-CM | POA: Diagnosis not present

## 2024-01-25 DIAGNOSIS — M87052 Idiopathic aseptic necrosis of left femur: Secondary | ICD-10-CM | POA: Diagnosis present

## 2024-01-25 DIAGNOSIS — S32030A Wedge compression fracture of third lumbar vertebra, initial encounter for closed fracture: Secondary | ICD-10-CM | POA: Diagnosis not present

## 2024-01-25 DIAGNOSIS — D75839 Thrombocytosis, unspecified: Secondary | ICD-10-CM | POA: Diagnosis present

## 2024-01-25 DIAGNOSIS — M879 Osteonecrosis, unspecified: Secondary | ICD-10-CM | POA: Diagnosis present

## 2024-01-25 DIAGNOSIS — M4850XA Collapsed vertebra, not elsewhere classified, site unspecified, initial encounter for fracture: Secondary | ICD-10-CM

## 2024-01-25 DIAGNOSIS — G4733 Obstructive sleep apnea (adult) (pediatric): Secondary | ICD-10-CM | POA: Diagnosis not present

## 2024-01-25 DIAGNOSIS — Z79891 Long term (current) use of opiate analgesic: Secondary | ICD-10-CM

## 2024-01-25 DIAGNOSIS — Z91041 Radiographic dye allergy status: Secondary | ICD-10-CM

## 2024-01-25 DIAGNOSIS — G894 Chronic pain syndrome: Secondary | ICD-10-CM | POA: Diagnosis present

## 2024-01-25 DIAGNOSIS — M8088XA Other osteoporosis with current pathological fracture, vertebra(e), initial encounter for fracture: Principal | ICD-10-CM | POA: Diagnosis present

## 2024-01-25 DIAGNOSIS — N390 Urinary tract infection, site not specified: Secondary | ICD-10-CM | POA: Diagnosis present

## 2024-01-25 DIAGNOSIS — I959 Hypotension, unspecified: Secondary | ICD-10-CM | POA: Diagnosis not present

## 2024-01-25 DIAGNOSIS — Z91030 Bee allergy status: Secondary | ICD-10-CM

## 2024-01-25 DIAGNOSIS — M5459 Other low back pain: Secondary | ICD-10-CM | POA: Diagnosis not present

## 2024-01-25 DIAGNOSIS — Z6839 Body mass index (BMI) 39.0-39.9, adult: Secondary | ICD-10-CM

## 2024-01-25 DIAGNOSIS — S32000G Wedge compression fracture of unspecified lumbar vertebra, subsequent encounter for fracture with delayed healing: Secondary | ICD-10-CM | POA: Diagnosis not present

## 2024-01-25 DIAGNOSIS — E876 Hypokalemia: Secondary | ICD-10-CM | POA: Diagnosis not present

## 2024-01-25 DIAGNOSIS — M5441 Lumbago with sciatica, right side: Secondary | ICD-10-CM | POA: Diagnosis not present

## 2024-01-25 DIAGNOSIS — G8929 Other chronic pain: Secondary | ICD-10-CM | POA: Diagnosis not present

## 2024-01-25 DIAGNOSIS — R29890 Loss of height: Secondary | ICD-10-CM | POA: Diagnosis not present

## 2024-01-25 DIAGNOSIS — E873 Alkalosis: Secondary | ICD-10-CM | POA: Diagnosis present

## 2024-01-25 DIAGNOSIS — B9620 Unspecified Escherichia coli [E. coli] as the cause of diseases classified elsewhere: Secondary | ICD-10-CM | POA: Diagnosis present

## 2024-01-25 DIAGNOSIS — Z885 Allergy status to narcotic agent status: Secondary | ICD-10-CM

## 2024-01-25 DIAGNOSIS — E66813 Obesity, class 3: Secondary | ICD-10-CM | POA: Diagnosis not present

## 2024-01-25 DIAGNOSIS — I5030 Unspecified diastolic (congestive) heart failure: Secondary | ICD-10-CM | POA: Diagnosis present

## 2024-01-25 DIAGNOSIS — S32040A Wedge compression fracture of fourth lumbar vertebra, initial encounter for closed fracture: Secondary | ICD-10-CM | POA: Diagnosis not present

## 2024-01-25 DIAGNOSIS — Z79890 Hormone replacement therapy (postmenopausal): Secondary | ICD-10-CM

## 2024-01-25 DIAGNOSIS — Z7952 Long term (current) use of systemic steroids: Secondary | ICD-10-CM

## 2024-01-25 DIAGNOSIS — M549 Dorsalgia, unspecified: Secondary | ICD-10-CM | POA: Diagnosis not present

## 2024-01-25 DIAGNOSIS — Z882 Allergy status to sulfonamides status: Secondary | ICD-10-CM

## 2024-01-25 DIAGNOSIS — E271 Primary adrenocortical insufficiency: Secondary | ICD-10-CM | POA: Diagnosis present

## 2024-01-25 DIAGNOSIS — D529 Folate deficiency anemia, unspecified: Secondary | ICD-10-CM | POA: Diagnosis present

## 2024-01-25 DIAGNOSIS — I272 Pulmonary hypertension, unspecified: Secondary | ICD-10-CM | POA: Diagnosis not present

## 2024-01-25 DIAGNOSIS — E039 Hypothyroidism, unspecified: Secondary | ICD-10-CM | POA: Diagnosis present

## 2024-01-25 DIAGNOSIS — Z91199 Patient's noncompliance with other medical treatment and regimen due to unspecified reason: Secondary | ICD-10-CM

## 2024-01-25 DIAGNOSIS — Z9981 Dependence on supplemental oxygen: Secondary | ICD-10-CM

## 2024-01-25 DIAGNOSIS — S32019A Unspecified fracture of first lumbar vertebra, initial encounter for closed fracture: Secondary | ICD-10-CM | POA: Diagnosis not present

## 2024-01-25 DIAGNOSIS — K5909 Other constipation: Secondary | ICD-10-CM | POA: Diagnosis present

## 2024-01-25 DIAGNOSIS — D509 Iron deficiency anemia, unspecified: Secondary | ICD-10-CM | POA: Diagnosis present

## 2024-01-25 DIAGNOSIS — D638 Anemia in other chronic diseases classified elsewhere: Secondary | ICD-10-CM | POA: Diagnosis present

## 2024-01-25 DIAGNOSIS — Z8249 Family history of ischemic heart disease and other diseases of the circulatory system: Secondary | ICD-10-CM

## 2024-01-25 DIAGNOSIS — M545 Low back pain, unspecified: Secondary | ICD-10-CM | POA: Diagnosis not present

## 2024-01-25 DIAGNOSIS — D860 Sarcoidosis of lung: Secondary | ICD-10-CM | POA: Diagnosis present

## 2024-01-25 DIAGNOSIS — T380X5A Adverse effect of glucocorticoids and synthetic analogues, initial encounter: Secondary | ICD-10-CM | POA: Diagnosis present

## 2024-01-25 DIAGNOSIS — R52 Pain, unspecified: Secondary | ICD-10-CM | POA: Diagnosis present

## 2024-01-25 DIAGNOSIS — M797 Fibromyalgia: Secondary | ICD-10-CM | POA: Diagnosis present

## 2024-01-25 DIAGNOSIS — R102 Pelvic and perineal pain: Secondary | ICD-10-CM | POA: Diagnosis not present

## 2024-01-25 DIAGNOSIS — J9611 Chronic respiratory failure with hypoxia: Secondary | ICD-10-CM | POA: Diagnosis present

## 2024-01-25 DIAGNOSIS — Z8042 Family history of malignant neoplasm of prostate: Secondary | ICD-10-CM

## 2024-01-25 DIAGNOSIS — E785 Hyperlipidemia, unspecified: Secondary | ICD-10-CM | POA: Diagnosis present

## 2024-01-25 DIAGNOSIS — M4856XA Collapsed vertebra, not elsewhere classified, lumbar region, initial encounter for fracture: Secondary | ICD-10-CM | POA: Diagnosis not present

## 2024-01-25 DIAGNOSIS — R32 Unspecified urinary incontinence: Secondary | ICD-10-CM | POA: Diagnosis present

## 2024-01-25 DIAGNOSIS — I341 Nonrheumatic mitral (valve) prolapse: Secondary | ICD-10-CM | POA: Diagnosis present

## 2024-01-25 DIAGNOSIS — S32049A Unspecified fracture of fourth lumbar vertebra, initial encounter for closed fracture: Secondary | ICD-10-CM | POA: Diagnosis not present

## 2024-01-25 DIAGNOSIS — J9612 Chronic respiratory failure with hypercapnia: Secondary | ICD-10-CM | POA: Diagnosis present

## 2024-01-25 DIAGNOSIS — E46 Unspecified protein-calorie malnutrition: Secondary | ICD-10-CM | POA: Diagnosis present

## 2024-01-25 DIAGNOSIS — Z79899 Other long term (current) drug therapy: Secondary | ICD-10-CM

## 2024-01-25 DIAGNOSIS — Z87892 Personal history of anaphylaxis: Secondary | ICD-10-CM

## 2024-01-25 DIAGNOSIS — F32A Depression, unspecified: Secondary | ICD-10-CM | POA: Diagnosis present

## 2024-01-25 DIAGNOSIS — Z803 Family history of malignant neoplasm of breast: Secondary | ICD-10-CM

## 2024-01-25 DIAGNOSIS — F419 Anxiety disorder, unspecified: Secondary | ICD-10-CM | POA: Diagnosis present

## 2024-01-25 LAB — CBC WITH DIFFERENTIAL/PLATELET
Abs Immature Granulocytes: 0.23 10*3/uL — ABNORMAL HIGH (ref 0.00–0.07)
Basophils Absolute: 0.1 10*3/uL (ref 0.0–0.1)
Basophils Relative: 1 %
Eosinophils Absolute: 0.1 10*3/uL (ref 0.0–0.5)
Eosinophils Relative: 1 %
HCT: 30.2 % — ABNORMAL LOW (ref 36.0–46.0)
Hemoglobin: 9.1 g/dL — ABNORMAL LOW (ref 12.0–15.0)
Immature Granulocytes: 3 %
Lymphocytes Relative: 11 %
Lymphs Abs: 1 10*3/uL (ref 0.7–4.0)
MCH: 29.5 pg (ref 26.0–34.0)
MCHC: 30.1 g/dL (ref 30.0–36.0)
MCV: 98.1 fL (ref 80.0–100.0)
Monocytes Absolute: 0.9 10*3/uL (ref 0.1–1.0)
Monocytes Relative: 10 %
Neutro Abs: 6.8 10*3/uL (ref 1.7–7.7)
Neutrophils Relative %: 74 %
Platelets: 415 10*3/uL — ABNORMAL HIGH (ref 150–400)
RBC: 3.08 MIL/uL — ABNORMAL LOW (ref 3.87–5.11)
RDW: 15.9 % — ABNORMAL HIGH (ref 11.5–15.5)
WBC: 9.1 10*3/uL (ref 4.0–10.5)
nRBC: 0.4 % — ABNORMAL HIGH (ref 0.0–0.2)

## 2024-01-25 LAB — COMPREHENSIVE METABOLIC PANEL WITH GFR
ALT: 23 IU/L (ref 0–32)
ALT: 25 U/L (ref 0–44)
AST: 20 IU/L (ref 0–40)
AST: 18 U/L (ref 15–41)
Albumin: 3.8 g/dL — ABNORMAL LOW (ref 3.9–4.9)
Albumin: 2.9 g/dL — ABNORMAL LOW (ref 3.5–5.0)
Alkaline Phosphatase: 129 IU/L — ABNORMAL HIGH (ref 44–121)
Alkaline Phosphatase: 96 U/L (ref 38–126)
Anion gap: 9 (ref 5–15)
BUN/Creatinine Ratio: 10 — ABNORMAL LOW (ref 12–28)
BUN: 8 mg/dL (ref 8–27)
BUN: 8 mg/dL (ref 8–23)
Bilirubin Total: 0.2 mg/dL (ref 0.0–1.2)
CO2: 30 mmol/L — ABNORMAL HIGH (ref 20–29)
CO2: 34 mmol/L — ABNORMAL HIGH (ref 22–32)
Calcium: 9.3 mg/dL (ref 8.7–10.3)
Calcium: 8.9 mg/dL (ref 8.9–10.3)
Chloride: 94 mmol/L — ABNORMAL LOW (ref 96–106)
Chloride: 98 mmol/L (ref 98–111)
Creatinine, Ser: 0.77 mg/dL (ref 0.57–1.00)
Creatinine, Ser: 0.8 mg/dL (ref 0.44–1.00)
GFR, Estimated: >60 mL/min (ref >60)
Globulin, Total: 2 g/dL (ref 1.5–4.5)
Glucose, Bld: 128 mg/dL — ABNORMAL HIGH (ref 70–99)
Glucose: 145 mg/dL — ABNORMAL HIGH (ref 70–99)
Potassium: 4.9 mmol/L (ref 3.5–5.2)
Potassium: 3.9 mmol/L (ref 3.5–5.1)
Sodium: 140 mmol/L (ref 134–144)
Sodium: 141 mmol/L (ref 135–145)
Total Bilirubin: 0.5 mg/dL (ref 0.0–1.2)
Total Protein: 5.8 g/dL — ABNORMAL LOW (ref 6.0–8.5)
Total Protein: 5.4 g/dL — ABNORMAL LOW (ref 6.5–8.1)
eGFR: 88 mL/min/{1.73_m2} (ref >59)

## 2024-01-25 LAB — URINALYSIS, ROUTINE W REFLEX MICROSCOPIC
Bilirubin Urine: NEGATIVE
Glucose, UA: NEGATIVE mg/dL
Hgb urine dipstick: NEGATIVE
Ketones, ur: NEGATIVE mg/dL
Nitrite: POSITIVE — AB
Protein, ur: NEGATIVE mg/dL
Specific Gravity, Urine: 1.017 (ref 1.005–1.030)
WBC, UA: >50 WBC/hpf (ref 0–5)
pH: 6 (ref 5.0–8.0)

## 2024-01-25 LAB — LIPID PANEL
Chol/HDL Ratio: 2.1 ratio (ref 0.0–4.4)
Cholesterol, Total: 115 mg/dL (ref 100–199)
HDL: 55 mg/dL (ref >39)
LDL Chol Calc (NIH): 37 mg/dL (ref 0–99)
Triglycerides: 133 mg/dL (ref 0–149)
VLDL Cholesterol Cal: 23 mg/dL (ref 5–40)

## 2024-01-25 LAB — TSH: TSH: 1.06 u[IU]/mL (ref 0.450–4.500)

## 2024-01-25 LAB — T4, FREE: Free T4: 1.25 ng/dL (ref 0.82–1.77)

## 2024-01-25 MED ORDER — HYDROMORPHONE HCL 1 MG/ML IJ SOLN
0.5000 mg | Freq: Once | INTRAMUSCULAR | Status: AC
  Administered 2024-01-25: 0.5 mg via INTRAVENOUS
  Filled 2024-01-25: qty 0.5

## 2024-01-25 MED ORDER — DIAZEPAM 5 MG/ML IJ SOLN
2.5000 mg | Freq: Once | INTRAMUSCULAR | Status: AC
  Administered 2024-01-25: 2.5 mg via INTRAVENOUS
  Filled 2024-01-25: qty 2

## 2024-01-25 MED ORDER — HYDROMORPHONE HCL 1 MG/ML IJ SOLN
1.0000 mg | Freq: Once | INTRAMUSCULAR | Status: AC
  Administered 2024-01-25: 1 mg via INTRAVENOUS
  Filled 2024-01-25: qty 1

## 2024-01-25 NOTE — ED Provider Notes (Signed)
 [place] EMERGENCY DEPARTMENT AT [facility] Provider Note   CSN: [id number] Arrival date & time: 01/25/24  1849     History  Chief Complaint  Patient presents with   Back Pain    Kathleen [name] is a 63 y.o. female.  Patient is a 63 year old female who presents to the emergency department the chief complaint of worsening lower back pain.  She does have a history of numerous atraumatic compression fractures.  She is currently on prednisone  chronically secondary to sarcoidosis.  She was recently admitted to the hospital secondary to worsening of her compression fracture as well as intractable back pain.  Patient notes that she is supposed to be having a kyphoplasty done on outpatient basis but notes that this is not been scheduled as of yet.  Patient denies any abdominal pain, nausea, vomiting, diarrhea.  She denies any recent falls or blunt trauma to her back.  Patient does have a history of chronic urinary incontinence.   Back Pain      Home Medications Prior to Admission medications   Medication Sig Start Date End Date Taking? Authorizing Provider  acetaminophen  (TYLENOL ) 500 MG tablet Take 2 tablets (1,000 mg total) by mouth 3 (three) times daily. 01/08/24   Mason Sole, Pratik D, DO  albuterol  (PROVENTIL ) (2.5 MG/3ML) 0.083% nebulizer solution Inhale 3 mL (2.5 mg total) by nebulization every four (4) hours as needed for wheezing or shortness of breath. 01/23/24   Colin Dawley, MD  budesonide  (PULMICORT ) 0.5 MG/2ML nebulizer solution Inhale the contents of 1 vial via nebulizer two (2) times a day. 01/23/24   Colin Dawley, MD  busPIRone  (BUSPAR ) 15 MG tablet Take 1 tablet (15 mg) by mouth 3 times daily. 01/23/24   Colin Dawley, MD  calcitonin, salmon, (MIACALCIN Marciano Settles) 200 UNIT/ACT nasal spray Use 1 spray in one nostril per day, alternate nostrils every other day (may have 1 vial she should use this for at least 2 weeks but may use as long as 4 weeks) 01/10/24    Luking, Jackelyn Marvel, MD  cyclobenzaprine  (FLEXERIL ) 5 MG tablet Take 1 tablet (5 mg total) by mouth 3 (three) times daily. 01/23/24   Colin Dawley, MD  DULoxetine  (CYMBALTA ) 60 MG capsule Take 1 capsule (60 mg total) by mouth daily. 01/23/24   Colin Dawley, MD  EPINEPHrine  0.3 mg/0.3 mL IJ SOAJ injection Inject 0.3 mg into the muscle as needed for anaphylaxis. 01/05/22   Bennet Brasil, MD  ferrous sulfate  325 (65 FE) MG EC tablet Take 325 mg by mouth once a week.    [provider]  furosemide  (LASIX ) 20 MG tablet Take 1 tablet (20 mg total) by mouth every Monday, Wednesday, and Friday. 07/17/23   Bennet Brasil, MD  hydrocortisone  (CORTEF ) 10 MG tablet Take 2 tablets by mouth at 8:00 am and 1 tablet at lunch. (May double dose on sick days x 3-5 days) 11/14/23   Nida, Gebreselassie W, MD  hydrOXYzine  (ATARAX ) 25 MG tablet Take 1 tablet (25 mg total) by mouth 3 (three) times daily as needed for anxiety. 12/02/23 12/01/24  Bennet Brasil, MD  ipratropium-albuterol  (DUONEB) 0.5-2.5 (3) MG/3ML SOLN Take 3 mLs by nebulization every 8 (eight) hours. 01/23/24   Colin Dawley, MD  lactulose  (CHRONULAC ) 10 GM/15ML solution Take 15 mLs (10 g total) by mouth daily as needed for mild constipation. 01/23/24   Colin Dawley, MD  levothyroxine  (SYNTHROID ) 50 MCG tablet Take 1 tablet by mouth daily before breakfast. 11/22/23 05/20/24  Nida,  Jaynee Meyer, MD  lidocaine  (LIDODERM ) 5 % Place 1 patch onto the skin daily. Remove & Discard patch within 12 hours or as directed by MD 04/09/23   Doreene Gammon D, DO  LINZESS  72 MCG capsule Take 1 capsule (72 mcg total) by mouth daily before breakfast. 01/23/24   Colin Dawley, MD  midodrine  (PROAMATINE ) 5 MG tablet Take 1 tablet (5 mg total) by mouth 2 (two) times daily with a meal. 06/19/23 06/18/24  Bennet Brasil, MD  ondansetron  (ZOFRAN -ODT) 8 MG disintegrating tablet Dissolve 1 tablet (8 mg total) by mouth every 8 (eight) hours as needed. Patient taking  differently: Take 8 mg by mouth every 8 (eight) hours as needed for nausea or vomiting. 04/23/23 04/22/24  Bennet Brasil, MD  oxyCODONE  (ROXICODONE ) 15 MG immediate release tablet Take 1 tablet (15 mg total) by mouth every 4 (four) hours as needed for moderate pain (pain score 4-6) or severe pain (pain score 7-10). 01/23/24   Colin Dawley, MD  OXYGEN  Inhale 5 L into the lungs continuous.    [provider]  pantoprazole  (PROTONIX ) 40 MG tablet Take 1 tablet (40 mg total) by mouth daily. 01/23/24 01/22/25  Colin Dawley, MD  polyethylene glycol (MIRALAX  / GLYCOLAX ) 17 g packet Take 17 g by mouth 2 (two) times daily. 01/23/24   Colin Dawley, MD  predniSONE  (DELTASONE ) 5 MG tablet Take 1 tablet (5 mg total) by mouth in the morning. 08/01/23     rosuvastatin  (CRESTOR ) 20 MG tablet Take 1 tablet (20 mg total) by mouth daily. 08/12/23   Bennet Brasil, MD  senna-docusate (SENOKOT-S) 8.6-50 MG tablet Take 2 tablets by mouth at bedtime. 01/23/24   Colin Dawley, MD  tamsulosin  (FLOMAX ) 0.4 MG CAPS capsule Take 1 capsule (0.4 mg total) by mouth daily after supper. 01/10/24   Bennet Brasil, MD  Vitamin D , Ergocalciferol , (DRISDOL ) 1.25 MG (50000 UNIT) CAPS capsule Take 1 capsule (50,000 Units total) by mouth every Sunday. 01/26/24   Colin Dawley, MD      Allergies    Bee venom, Contrast media [iodinated contrast media], Morphine , Bactrim  [sulfamethoxazole -trimethoprim ], and Codeine    Review of Systems   Review of Systems  Musculoskeletal:  Positive for back pain.  All other systems reviewed and are negative.   Physical Exam Updated Vital Signs BP (!) 124/55 (BP Location: Right Arm)   Pulse 90   Temp 97.8 F (36.6 C) (Axillary)   Resp 16   Ht 5\' 10"  (1.778 m)   Wt 125.6 kg   LMP 05/17/2016   SpO2 100%   BMI 39.75 kg/m  Physical Exam Vitals and nursing note reviewed.  Constitutional:      Appearance: Normal appearance.  HENT:     Head: Normocephalic and atraumatic.      Nose: Nose normal.     Mouth/Throat:     Mouth: Mucous membranes are moist.  Eyes:     Extraocular Movements: Extraocular movements intact.     Conjunctiva/sclera: Conjunctivae normal.     Pupils: Pupils are equal, round, and reactive to light.  Cardiovascular:     Rate and Rhythm: Normal rate and regular rhythm.     Pulses: Normal pulses.     Heart sounds: Normal heart sounds. No murmur heard.    No gallop.  Pulmonary:     Effort: Pulmonary effort is normal. No respiratory distress.     Breath sounds: Normal breath sounds. No stridor. No wheezing, rhonchi or rales.  Abdominal:  General: Abdomen is flat. Bowel sounds are normal. There is no distension.     Palpations: Abdomen is soft. There is no mass.     Tenderness: There is no abdominal tenderness. There is no guarding.     Hernia: No hernia is present.  Musculoskeletal:        General: Normal range of motion.     Cervical back: Normal range of motion and neck supple.     Comments: Tender to palpation over lumbar spine diffusely, no step-off or deformity, nontender palpation of remainder bilateral lower extremities, no CVA tenderness  Skin:    General: Skin is warm and dry.     Findings: No bruising or rash.  Neurological:     General: No focal deficit present.     Mental Status: She is alert and oriented to person, place, and time. Mental status is at baseline.     Cranial Nerves: No cranial nerve deficit.     Sensory: No sensory deficit.     Motor: No weakness.     Coordination: Coordination normal.  Psychiatric:        Mood and Affect: Mood normal.        Behavior: Behavior normal.        Thought Content: Thought content normal.        Judgment: Judgment normal.     ED Results / Procedures / Treatments   Labs (all labs ordered are listed, but only abnormal results are displayed) Labs Reviewed  COMPREHENSIVE METABOLIC PANEL WITH GFR - Abnormal; Notable for the following components:      Result Value   CO2 34  (*)    Glucose, Bld 128 (*)    Total Protein 5.4 (*)    Albumin 2.9 (*)    All other components within normal limits  CBC WITH DIFFERENTIAL/PLATELET - Abnormal; Notable for the following components:   RBC 3.08 (*)    Hemoglobin 9.1 (*)    HCT 30.2 (*)    RDW 15.9 (*)    Platelets 415 (*)    nRBC 0.4 (*)    Abs Immature Granulocytes 0.23 (*)    All other components within normal limits  URINALYSIS, ROUTINE W REFLEX MICROSCOPIC    EKG None  Radiology DG Lumbar Spine Complete Result Date: 01/25/2024 CLINICAL DATA:  Compression fracture, chronic steroid use, low back pain EXAM: LUMBAR SPINE - COMPLETE 4+ VIEW COMPARISON:  04/07/2020 FINDINGS: Interval development of superior endplate fractures of L1 with minimal loss of height. Stable superior endplate fractures of L3 and L4, acute to subacute and better assessed on prior MRI examination of 01/03/2024. No listhesis. Intervertebral disc heights are preserved. Paraspinal soft tissues are unremarkable. IMPRESSION: 1. Interval development of acute superior endplate fractures of L1 with minimal loss of height since prior MRI examination. 2. Stable superior endplate fractures L3 and L4, better assessed on MRI examination of 01/03/2024 four views are seen to be acute-subacute. Electronically Signed   By: Worthy Heads M.D.   On: 01/25/2024 21:04    Procedures Procedures    Medications Ordered in ED Medications  HYDROmorphone  (DILAUDID ) injection 1 mg (1 mg Intravenous Given 01/25/24 2135)  diazepam  (VALIUM ) injection 2.5 mg (2.5 mg Intravenous Given 01/25/24 2234)    ED Course/ Medical Decision Making/ A&P                                 Medical Decision Making Patient does  remain stable at this time.  Patient continues to have ongoing intractable back pain at this point despite multiple pain medications in the emergency department.  Additional medications have been added.  Did add on CT scan of the lumbar spine and pelvis given the  patient's increased pain and abnormal lumbar x-ray.  Low suspicion for underlying etiology such as cauda equina syndrome, vertebral osteomyelitis, epidural abscess.  Suspect that her pain is secondary to her compression fractures.  Will sign patient out to Dr. Carie Charity at 2300 on 01/25/2024 pending CT scan results and final dispo.  Suspect the patient may warrant admission at this time for intractable back pain and ambulatory dysfunction.  Amount and/or Complexity of Data Reviewed Labs: ordered. Radiology: ordered.  Risk Prescription drug management.           Final Clinical Impression(s) / ED Diagnoses Final diagnoses:  None    Rx / DC Orders ED Discharge Orders     None         Roselynn Connors, PA-C 01/25/24 2310    Ballard Bongo, MD 01/26/24 [phone]

## 2024-01-25 NOTE — ED Triage Notes (Signed)
 Patient arrives via [facility] from home. C/o lumbar pain. Patient has a history of recent compression fractures and long term use of prednisone . Patient denies falling or any recent injuries to her back. Patient currently on 4L of oxygen / [place]

## 2024-01-26 DIAGNOSIS — S32040A Wedge compression fracture of fourth lumbar vertebra, initial encounter for closed fracture: Secondary | ICD-10-CM | POA: Diagnosis not present

## 2024-01-26 DIAGNOSIS — E039 Hypothyroidism, unspecified: Secondary | ICD-10-CM | POA: Diagnosis not present

## 2024-01-26 DIAGNOSIS — M545 Low back pain, unspecified: Secondary | ICD-10-CM | POA: Diagnosis not present

## 2024-01-26 DIAGNOSIS — B9620 Unspecified Escherichia coli [E. coli] as the cause of diseases classified elsewhere: Secondary | ICD-10-CM | POA: Diagnosis not present

## 2024-01-26 DIAGNOSIS — S12090S Other displaced fracture of first cervical vertebra, sequela: Secondary | ICD-10-CM | POA: Diagnosis not present

## 2024-01-26 DIAGNOSIS — K219 Gastro-esophageal reflux disease without esophagitis: Secondary | ICD-10-CM | POA: Diagnosis not present

## 2024-01-26 DIAGNOSIS — I272 Pulmonary hypertension, unspecified: Secondary | ICD-10-CM | POA: Diagnosis not present

## 2024-01-26 DIAGNOSIS — D649 Anemia, unspecified: Secondary | ICD-10-CM | POA: Diagnosis not present

## 2024-01-26 DIAGNOSIS — M87052 Idiopathic aseptic necrosis of left femur: Secondary | ICD-10-CM | POA: Diagnosis not present

## 2024-01-26 DIAGNOSIS — K5900 Constipation, unspecified: Secondary | ICD-10-CM | POA: Diagnosis not present

## 2024-01-26 DIAGNOSIS — G8929 Other chronic pain: Secondary | ICD-10-CM | POA: Diagnosis not present

## 2024-01-26 DIAGNOSIS — S129XXS Fracture of neck, unspecified, sequela: Secondary | ICD-10-CM | POA: Diagnosis not present

## 2024-01-26 DIAGNOSIS — E46 Unspecified protein-calorie malnutrition: Secondary | ICD-10-CM | POA: Diagnosis not present

## 2024-01-26 DIAGNOSIS — S32000D Wedge compression fracture of unspecified lumbar vertebra, subsequent encounter for fracture with routine healing: Secondary | ICD-10-CM | POA: Diagnosis not present

## 2024-01-26 DIAGNOSIS — D509 Iron deficiency anemia, unspecified: Secondary | ICD-10-CM | POA: Diagnosis not present

## 2024-01-26 DIAGNOSIS — R300 Dysuria: Secondary | ICD-10-CM | POA: Diagnosis not present

## 2024-01-26 DIAGNOSIS — E271 Primary adrenocortical insufficiency: Secondary | ICD-10-CM | POA: Diagnosis not present

## 2024-01-26 DIAGNOSIS — K567 Ileus, unspecified: Secondary | ICD-10-CM | POA: Diagnosis not present

## 2024-01-26 DIAGNOSIS — R102 Pelvic and perineal pain: Secondary | ICD-10-CM | POA: Diagnosis not present

## 2024-01-26 DIAGNOSIS — M4856XA Collapsed vertebra, not elsewhere classified, lumbar region, initial encounter for fracture: Secondary | ICD-10-CM | POA: Diagnosis not present

## 2024-01-26 DIAGNOSIS — S32020S Wedge compression fracture of second lumbar vertebra, sequela: Secondary | ICD-10-CM | POA: Diagnosis not present

## 2024-01-26 DIAGNOSIS — S32019A Unspecified fracture of first lumbar vertebra, initial encounter for closed fracture: Secondary | ICD-10-CM | POA: Diagnosis not present

## 2024-01-26 DIAGNOSIS — S129XXD Fracture of neck, unspecified, subsequent encounter: Secondary | ICD-10-CM | POA: Diagnosis not present

## 2024-01-26 DIAGNOSIS — E66813 Obesity, class 3: Secondary | ICD-10-CM

## 2024-01-26 DIAGNOSIS — J9611 Chronic respiratory failure with hypoxia: Secondary | ICD-10-CM

## 2024-01-26 DIAGNOSIS — F419 Anxiety disorder, unspecified: Secondary | ICD-10-CM | POA: Diagnosis not present

## 2024-01-26 DIAGNOSIS — R7989 Other specified abnormal findings of blood chemistry: Secondary | ICD-10-CM | POA: Diagnosis not present

## 2024-01-26 DIAGNOSIS — I341 Nonrheumatic mitral (valve) prolapse: Secondary | ICD-10-CM | POA: Diagnosis not present

## 2024-01-26 DIAGNOSIS — D529 Folate deficiency anemia, unspecified: Secondary | ICD-10-CM | POA: Diagnosis not present

## 2024-01-26 DIAGNOSIS — M879 Osteonecrosis, unspecified: Secondary | ICD-10-CM | POA: Diagnosis not present

## 2024-01-26 DIAGNOSIS — M797 Fibromyalgia: Secondary | ICD-10-CM | POA: Diagnosis not present

## 2024-01-26 DIAGNOSIS — N3000 Acute cystitis without hematuria: Secondary | ICD-10-CM | POA: Diagnosis not present

## 2024-01-26 DIAGNOSIS — K5909 Other constipation: Secondary | ICD-10-CM | POA: Diagnosis not present

## 2024-01-26 DIAGNOSIS — G4733 Obstructive sleep apnea (adult) (pediatric): Secondary | ICD-10-CM | POA: Diagnosis not present

## 2024-01-26 DIAGNOSIS — J849 Interstitial pulmonary disease, unspecified: Secondary | ICD-10-CM | POA: Diagnosis not present

## 2024-01-26 DIAGNOSIS — F418 Other specified anxiety disorders: Secondary | ICD-10-CM | POA: Diagnosis not present

## 2024-01-26 DIAGNOSIS — S32119D Unspecified Zone I fracture of sacrum, subsequent encounter for fracture with routine healing: Secondary | ICD-10-CM | POA: Diagnosis not present

## 2024-01-26 DIAGNOSIS — I9589 Other hypotension: Secondary | ICD-10-CM | POA: Diagnosis not present

## 2024-01-26 DIAGNOSIS — M8088XA Other osteoporosis with current pathological fracture, vertebra(e), initial encounter for fracture: Secondary | ICD-10-CM | POA: Diagnosis not present

## 2024-01-26 DIAGNOSIS — M5440 Lumbago with sciatica, unspecified side: Secondary | ICD-10-CM

## 2024-01-26 DIAGNOSIS — D860 Sarcoidosis of lung: Secondary | ICD-10-CM | POA: Diagnosis not present

## 2024-01-26 DIAGNOSIS — R079 Chest pain, unspecified: Secondary | ICD-10-CM | POA: Diagnosis not present

## 2024-01-26 DIAGNOSIS — R110 Nausea: Secondary | ICD-10-CM | POA: Diagnosis not present

## 2024-01-26 DIAGNOSIS — Z9049 Acquired absence of other specified parts of digestive tract: Secondary | ICD-10-CM | POA: Diagnosis not present

## 2024-01-26 DIAGNOSIS — J9612 Chronic respiratory failure with hypercapnia: Secondary | ICD-10-CM | POA: Diagnosis not present

## 2024-01-26 DIAGNOSIS — I5030 Unspecified diastolic (congestive) heart failure: Secondary | ICD-10-CM | POA: Diagnosis not present

## 2024-01-26 DIAGNOSIS — S32000G Wedge compression fracture of unspecified lumbar vertebra, subsequent encounter for fracture with delayed healing: Secondary | ICD-10-CM | POA: Diagnosis not present

## 2024-01-26 DIAGNOSIS — D638 Anemia in other chronic diseases classified elsewhere: Secondary | ICD-10-CM | POA: Diagnosis not present

## 2024-01-26 DIAGNOSIS — M549 Dorsalgia, unspecified: Secondary | ICD-10-CM | POA: Diagnosis not present

## 2024-01-26 DIAGNOSIS — M5459 Other low back pain: Secondary | ICD-10-CM | POA: Diagnosis not present

## 2024-01-26 DIAGNOSIS — R1310 Dysphagia, unspecified: Secondary | ICD-10-CM | POA: Diagnosis not present

## 2024-01-26 DIAGNOSIS — E876 Hypokalemia: Secondary | ICD-10-CM | POA: Diagnosis not present

## 2024-01-26 DIAGNOSIS — R918 Other nonspecific abnormal finding of lung field: Secondary | ICD-10-CM | POA: Diagnosis not present

## 2024-01-26 DIAGNOSIS — E873 Alkalosis: Secondary | ICD-10-CM | POA: Diagnosis not present

## 2024-01-26 DIAGNOSIS — S32049A Unspecified fracture of fourth lumbar vertebra, initial encounter for closed fracture: Secondary | ICD-10-CM | POA: Diagnosis not present

## 2024-01-26 DIAGNOSIS — S32030A Wedge compression fracture of third lumbar vertebra, initial encounter for closed fracture: Secondary | ICD-10-CM | POA: Diagnosis not present

## 2024-01-26 DIAGNOSIS — N390 Urinary tract infection, site not specified: Secondary | ICD-10-CM | POA: Diagnosis not present

## 2024-01-26 DIAGNOSIS — F32A Depression, unspecified: Secondary | ICD-10-CM | POA: Diagnosis not present

## 2024-01-26 DIAGNOSIS — D62 Acute posthemorrhagic anemia: Secondary | ICD-10-CM | POA: Diagnosis not present

## 2024-01-26 DIAGNOSIS — S32019D Unspecified fracture of first lumbar vertebra, subsequent encounter for fracture with routine healing: Secondary | ICD-10-CM | POA: Diagnosis not present

## 2024-01-26 DIAGNOSIS — E785 Hyperlipidemia, unspecified: Secondary | ICD-10-CM | POA: Diagnosis not present

## 2024-01-26 DIAGNOSIS — S32039D Unspecified fracture of third lumbar vertebra, subsequent encounter for fracture with routine healing: Secondary | ICD-10-CM | POA: Diagnosis not present

## 2024-01-26 DIAGNOSIS — D869 Sarcoidosis, unspecified: Secondary | ICD-10-CM | POA: Diagnosis not present

## 2024-01-26 DIAGNOSIS — K5901 Slow transit constipation: Secondary | ICD-10-CM | POA: Diagnosis not present

## 2024-01-26 DIAGNOSIS — R339 Retention of urine, unspecified: Secondary | ICD-10-CM | POA: Diagnosis not present

## 2024-01-26 DIAGNOSIS — R29890 Loss of height: Secondary | ICD-10-CM | POA: Diagnosis not present

## 2024-01-26 DIAGNOSIS — G894 Chronic pain syndrome: Secondary | ICD-10-CM | POA: Diagnosis not present

## 2024-01-26 LAB — COMPREHENSIVE METABOLIC PANEL WITH GFR
ALT: 23 U/L (ref 0–44)
AST: 17 U/L (ref 15–41)
Albumin: 2.8 g/dL — ABNORMAL LOW (ref 3.5–5.0)
Alkaline Phosphatase: 92 U/L (ref 38–126)
Anion gap: 8 (ref 5–15)
BUN: 11 mg/dL (ref 8–23)
CO2: 33 mmol/L — ABNORMAL HIGH (ref 22–32)
Calcium: 8.8 mg/dL — ABNORMAL LOW (ref 8.9–10.3)
Chloride: 100 mmol/L (ref 98–111)
Creatinine, Ser: 0.72 mg/dL (ref 0.44–1.00)
GFR, Estimated: >60 mL/min (ref >60)
Glucose, Bld: 102 mg/dL — ABNORMAL HIGH (ref 70–99)
Potassium: 3.7 mmol/L (ref 3.5–5.1)
Sodium: 141 mmol/L (ref 135–145)
Total Bilirubin: 0.4 mg/dL (ref 0.0–1.2)
Total Protein: 5.5 g/dL — ABNORMAL LOW (ref 6.5–8.1)

## 2024-01-26 LAB — RETICULOCYTES
Immature Retic Fract: 38.4 % — ABNORMAL HIGH (ref 2.3–15.9)
RBC.: 3 MIL/uL — ABNORMAL LOW (ref 3.87–5.11)
Retic Count, Absolute: 89.7 10*3/uL (ref 19.0–186.0)
Retic Ct Pct: 3 % (ref 0.4–3.1)

## 2024-01-26 LAB — FOLATE: Folate: 9.2 ng/mL (ref >5.9)

## 2024-01-26 LAB — CBC
HCT: 29.4 % — ABNORMAL LOW (ref 36.0–46.0)
Hemoglobin: 8.8 g/dL — ABNORMAL LOW (ref 12.0–15.0)
MCH: 29.3 pg (ref 26.0–34.0)
MCHC: 29.9 g/dL — ABNORMAL LOW (ref 30.0–36.0)
MCV: 98 fL (ref 80.0–100.0)
Platelets: 423 10*3/uL — ABNORMAL HIGH (ref 150–400)
RBC: 3 MIL/uL — ABNORMAL LOW (ref 3.87–5.11)
RDW: 15.9 % — ABNORMAL HIGH (ref 11.5–15.5)
WBC: 9 10*3/uL (ref 4.0–10.5)
nRBC: 0.4 % — ABNORMAL HIGH (ref 0.0–0.2)

## 2024-01-26 LAB — PHOSPHORUS: Phosphorus: 3.9 mg/dL (ref 2.5–4.6)

## 2024-01-26 LAB — FERRITIN: Ferritin: 19 ng/mL (ref 11–307)

## 2024-01-26 LAB — IRON AND TIBC
Iron: 39 ug/dL (ref 28–170)
Saturation Ratios: 13 % (ref 10.4–31.8)
TIBC: 308 ug/dL (ref 250–450)
UIBC: 269 ug/dL

## 2024-01-26 LAB — VITAMIN B12: Vitamin B-12: 429 pg/mL (ref 180–914)

## 2024-01-26 LAB — MAGNESIUM: Magnesium: 2.2 mg/dL (ref 1.7–2.4)

## 2024-01-26 MED ORDER — LEVOTHYROXINE SODIUM 50 MCG PO TABS
50.0000 ug | ORAL_TABLET | Freq: Every day | ORAL | Status: DC
  Administered 2024-01-26 – 2024-02-06 (×12): 50 ug via ORAL
  Filled 2024-01-26 (×12): qty 1

## 2024-01-26 MED ORDER — LIDOCAINE 5 % EX PTCH
1.0000 | MEDICATED_PATCH | CUTANEOUS | Status: DC
  Administered 2024-01-26 – 2024-02-06 (×12): 1 via TRANSDERMAL
  Filled 2024-01-26 (×12): qty 1

## 2024-01-26 MED ORDER — ONDANSETRON HCL 4 MG PO TABS
4.0000 mg | ORAL_TABLET | Freq: Four times a day (QID) | ORAL | Status: DC | PRN
  Administered 2024-01-27 – 2024-02-05 (×6): 4 mg via ORAL
  Filled 2024-01-26 (×6): qty 1

## 2024-01-26 MED ORDER — LINACLOTIDE 72 MCG PO CAPS
72.0000 ug | ORAL_CAPSULE | Freq: Every day | ORAL | Status: DC
  Administered 2024-01-27 – 2024-02-06 (×11): 72 ug via ORAL
  Filled 2024-01-26 (×12): qty 1

## 2024-01-26 MED ORDER — TAMSULOSIN HCL 0.4 MG PO CAPS
0.4000 mg | ORAL_CAPSULE | Freq: Every day | ORAL | Status: DC
  Administered 2024-01-26 – 2024-02-05 (×11): 0.4 mg via ORAL
  Filled 2024-01-26 (×11): qty 1

## 2024-01-26 MED ORDER — HYDROMORPHONE HCL 1 MG/ML IJ SOLN
1.0000 mg | INTRAMUSCULAR | Status: DC | PRN
  Administered 2024-01-26 – 2024-01-31 (×27): 1 mg via INTRAVENOUS
  Filled 2024-01-26 (×28): qty 1

## 2024-01-26 MED ORDER — LORAZEPAM 0.5 MG PO TABS
0.5000 mg | ORAL_TABLET | Freq: Once | ORAL | Status: AC
  Administered 2024-01-26: 0.5 mg via ORAL
  Filled 2024-01-26: qty 1

## 2024-01-26 MED ORDER — ROSUVASTATIN CALCIUM 20 MG PO TABS
20.0000 mg | ORAL_TABLET | Freq: Every day | ORAL | Status: DC
  Administered 2024-01-26 – 2024-02-06 (×12): 20 mg via ORAL
  Filled 2024-01-26 (×12): qty 1

## 2024-01-26 MED ORDER — HYDROCORTISONE 20 MG PO TABS
20.0000 mg | ORAL_TABLET | Freq: Every day | ORAL | Status: DC
  Administered 2024-01-27 – 2024-02-06 (×10): 20 mg via ORAL
  Filled 2024-01-26 (×12): qty 1

## 2024-01-26 MED ORDER — OXYCODONE HCL 5 MG PO TABS
15.0000 mg | ORAL_TABLET | ORAL | Status: DC | PRN
  Administered 2024-01-26 – 2024-01-29 (×8): 15 mg via ORAL
  Filled 2024-01-26 (×8): qty 3

## 2024-01-26 MED ORDER — ALENDRONATE SODIUM 70 MG PO TABS
70.0000 mg | ORAL_TABLET | Freq: Every day | ORAL | Status: DC
  Administered 2024-01-29 – 2024-02-02 (×5): 70 mg via ORAL
  Filled 2024-01-26 (×2): qty 1
  Filled 2024-01-26 (×4): qty 7
  Filled 2024-01-26 (×3): qty 1
  Filled 2024-01-26: qty 7

## 2024-01-26 MED ORDER — ACETAMINOPHEN 650 MG RE SUPP: 650.0000 mg | Freq: Four times a day (QID) | RECTAL | Status: DC | PRN

## 2024-01-26 MED ORDER — OXYCODONE HCL 5 MG PO TABS
15.0000 mg | ORAL_TABLET | Freq: Once | ORAL | Status: AC
  Administered 2024-01-26: 15 mg via ORAL
  Filled 2024-01-26: qty 3

## 2024-01-26 MED ORDER — HYDROXYZINE HCL 25 MG PO TABS
25.0000 mg | ORAL_TABLET | Freq: Three times a day (TID) | ORAL | Status: DC | PRN
  Administered 2024-01-26 – 2024-02-05 (×19): 25 mg via ORAL
  Filled 2024-01-26 (×20): qty 1

## 2024-01-26 MED ORDER — MIDODRINE HCL 5 MG PO TABS
5.0000 mg | ORAL_TABLET | Freq: Two times a day (BID) | ORAL | Status: DC
  Administered 2024-01-26 – 2024-02-06 (×23): 5 mg via ORAL
  Filled 2024-01-26 (×23): qty 1

## 2024-01-26 MED ORDER — BUSPIRONE HCL 5 MG PO TABS
15.0000 mg | ORAL_TABLET | Freq: Three times a day (TID) | ORAL | Status: DC
  Administered 2024-01-26 – 2024-02-06 (×34): 15 mg via ORAL
  Filled 2024-01-26: qty 1
  Filled 2024-01-26: qty 3
  Filled 2024-01-26 (×3): qty 1
  Filled 2024-01-26: qty 3
  Filled 2024-01-26 (×9): qty 1
  Filled 2024-01-26: qty 3
  Filled 2024-01-26 (×8): qty 1
  Filled 2024-01-26: qty 3
  Filled 2024-01-26 (×7): qty 1
  Filled 2024-01-26: qty 3
  Filled 2024-01-26: qty 1

## 2024-01-26 MED ORDER — PROCHLORPERAZINE EDISYLATE 10 MG/2ML IJ SOLN
10.0000 mg | Freq: Once | INTRAMUSCULAR | Status: DC
  Filled 2024-01-26 (×2): qty 2

## 2024-01-26 MED ORDER — FERROUS SULFATE 325 (65 FE) MG PO TABS
325.0000 mg | ORAL_TABLET | ORAL | Status: DC
  Administered 2024-01-27 – 2024-02-03 (×2): 325 mg via ORAL
  Filled 2024-01-26 (×3): qty 1

## 2024-01-26 MED ORDER — BUDESONIDE 0.5 MG/2ML IN SUSP
0.5000 mg | Freq: Two times a day (BID) | RESPIRATORY_TRACT | Status: DC
  Administered 2024-01-26 – 2024-02-06 (×20): 0.5 mg via RESPIRATORY_TRACT
  Filled 2024-01-26 (×21): qty 2

## 2024-01-26 MED ORDER — CALCITONIN (SALMON) 200 UNIT/ACT NA SOLN
1.0000 | Freq: Every day | NASAL | Status: DC
  Filled 2024-01-26: qty 3.7

## 2024-01-26 MED ORDER — DULOXETINE HCL 60 MG PO CPEP
60.0000 mg | ORAL_CAPSULE | Freq: Every day | ORAL | Status: DC
  Administered 2024-01-26 – 2024-02-06 (×12): 60 mg via ORAL
  Filled 2024-01-26 (×6): qty 1
  Filled 2024-01-26: qty 2
  Filled 2024-01-26 (×5): qty 1

## 2024-01-26 MED ORDER — PREDNISONE 5 MG PO TABS
5.0000 mg | ORAL_TABLET | Freq: Every day | ORAL | Status: DC
  Administered 2024-01-27 – 2024-02-02 (×7): 5 mg via ORAL
  Filled 2024-01-26 (×8): qty 1

## 2024-01-26 MED ORDER — PANTOPRAZOLE SODIUM 40 MG PO TBEC
40.0000 mg | DELAYED_RELEASE_TABLET | Freq: Every day | ORAL | Status: DC
  Administered 2024-01-26 – 2024-02-06 (×12): 40 mg via ORAL
  Filled 2024-01-26 (×12): qty 1

## 2024-01-26 MED ORDER — LACTULOSE 10 GM/15ML PO SOLN
30.0000 g | Freq: Once | ORAL | Status: AC
  Administered 2024-01-26: 30 g via ORAL
  Filled 2024-01-26: qty 60

## 2024-01-26 MED ORDER — VITAMIN D (ERGOCALCIFEROL) 1.25 MG (50000 UNIT) PO CAPS
50000.0000 [IU] | ORAL_CAPSULE | ORAL | Status: DC
  Administered 2024-02-02: 50000 [IU] via ORAL
  Filled 2024-01-26: qty 1

## 2024-01-26 MED ORDER — POLYETHYLENE GLYCOL 3350 17 G PO PACK
17.0000 g | PACK | Freq: Two times a day (BID) | ORAL | Status: DC
  Administered 2024-01-26 – 2024-01-29 (×5): 17 g via ORAL
  Filled 2024-01-26 (×6): qty 1

## 2024-01-26 MED ORDER — SODIUM CHLORIDE 0.9 % IV SOLN
1.0000 g | INTRAVENOUS | Status: AC
  Administered 2024-01-26 – 2024-01-28 (×3): 1 g via INTRAVENOUS
  Filled 2024-01-26 (×3): qty 10

## 2024-01-26 MED ORDER — HYDROCORTISONE 10 MG PO TABS
10.0000 mg | ORAL_TABLET | Freq: Every day | ORAL | Status: DC
  Administered 2024-01-26 – 2024-02-06 (×10): 10 mg via ORAL
  Filled 2024-01-26 (×13): qty 1

## 2024-01-26 MED ORDER — FLEET ENEMA RE ENEM: 1.0000 | ENEMA | Freq: Once | RECTAL | Status: DC

## 2024-01-26 MED ORDER — IPRATROPIUM-ALBUTEROL 0.5-2.5 (3) MG/3ML IN SOLN
3.0000 mL | Freq: Three times a day (TID) | RESPIRATORY_TRACT | Status: DC
  Administered 2024-01-26 (×2): 3 mL via RESPIRATORY_TRACT

## 2024-01-26 MED ORDER — LACTULOSE 10 GM/15ML PO SOLN: 10.0000 g | Freq: Every day | ORAL | Status: DC | PRN

## 2024-01-26 MED ORDER — IPRATROPIUM-ALBUTEROL 0.5-2.5 (3) MG/3ML IN SOLN
RESPIRATORY_TRACT | Status: AC
Start: 2024-01-26 — End: 2024-01-27
  Filled 2024-01-26: qty 3

## 2024-01-26 MED ORDER — ONDANSETRON HCL 4 MG/2ML IJ SOLN
4.0000 mg | Freq: Four times a day (QID) | INTRAMUSCULAR | Status: DC | PRN
  Administered 2024-01-26: 4 mg via INTRAVENOUS
  Filled 2024-01-26 (×2): qty 2

## 2024-01-26 MED ORDER — HYDROMORPHONE HCL 1 MG/ML IJ SOLN
1.0000 mg | Freq: Once | INTRAMUSCULAR | Status: AC
  Administered 2024-01-26: 1 mg via INTRAVENOUS
  Filled 2024-01-26: qty 1

## 2024-01-26 MED ORDER — CYCLOBENZAPRINE HCL 10 MG PO TABS
5.0000 mg | ORAL_TABLET | Freq: Three times a day (TID) | ORAL | Status: DC | PRN
  Administered 2024-01-26 – 2024-01-27 (×3): 5 mg via ORAL
  Filled 2024-01-26 (×3): qty 1

## 2024-01-26 MED ORDER — SENNOSIDES-DOCUSATE SODIUM 8.6-50 MG PO TABS
2.0000 | ORAL_TABLET | Freq: Every day | ORAL | Status: DC
  Administered 2024-01-26 – 2024-02-05 (×9): 2 via ORAL
  Filled 2024-01-26 (×11): qty 2

## 2024-01-26 MED ORDER — PREGABALIN 25 MG PO CAPS
25.0000 mg | ORAL_CAPSULE | Freq: Three times a day (TID) | ORAL | Status: DC
  Administered 2024-01-26 – 2024-01-27 (×4): 25 mg via ORAL
  Filled 2024-01-26 (×4): qty 1

## 2024-01-26 MED ORDER — FUROSEMIDE 20 MG PO TABS
20.0000 mg | ORAL_TABLET | ORAL | Status: DC
  Administered 2024-01-27 – 2024-02-05 (×5): 20 mg via ORAL
  Filled 2024-01-26 (×5): qty 1

## 2024-01-26 MED ORDER — ACETAMINOPHEN 325 MG PO TABS
650.0000 mg | ORAL_TABLET | Freq: Four times a day (QID) | ORAL | Status: DC | PRN
  Administered 2024-02-03 – 2024-02-04 (×2): 650 mg via ORAL
  Filled 2024-01-26 (×2): qty 2

## 2024-01-26 MED ORDER — SODIUM CHLORIDE 0.9% FLUSH
3.0000 mL | Freq: Two times a day (BID) | INTRAVENOUS | Status: DC
Start: 2024-01-26 — End: 2024-01-29
  Administered 2024-01-26 – 2024-01-29 (×7): 3 mL via INTRAVENOUS

## 2024-01-26 MED ORDER — HYDROMORPHONE HCL 1 MG/ML IJ SOLN
0.5000 mg | INTRAMUSCULAR | Status: DC | PRN
  Administered 2024-01-26 (×2): 0.5 mg via INTRAVENOUS
  Filled 2024-01-26 (×2): qty 0.5

## 2024-01-26 MED ORDER — ALBUTEROL SULFATE (2.5 MG/3ML) 0.083% IN NEBU
2.5000 mg | INHALATION_SOLUTION | RESPIRATORY_TRACT | Status: DC | PRN
  Administered 2024-01-26 – 2024-02-02 (×2): 2.5 mg via RESPIRATORY_TRACT
  Filled 2024-01-26 (×2): qty 3

## 2024-01-26 MED ORDER — ENOXAPARIN SODIUM 40 MG/0.4ML IJ SOSY: 40.0000 mg | PREFILLED_SYRINGE | INTRAMUSCULAR | Status: DC

## 2024-01-26 MED ORDER — IPRATROPIUM-ALBUTEROL 0.5-2.5 (3) MG/3ML IN SOLN
RESPIRATORY_TRACT | Status: AC
  Administered 2024-01-26: 3 mL via RESPIRATORY_TRACT
  Filled 2024-01-26: qty 3

## 2024-01-26 MED ORDER — OXYCODONE HCL 5 MG PO TABS
5.0000 mg | ORAL_TABLET | ORAL | Status: DC | PRN
  Administered 2024-01-26: 5 mg via ORAL
  Filled 2024-01-26: qty 1

## 2024-01-26 MED ORDER — ENOXAPARIN SODIUM 60 MG/0.6ML IJ SOSY
60.0000 mg | PREFILLED_SYRINGE | INTRAMUSCULAR | Status: DC
  Administered 2024-01-26 – 2024-02-06 (×12): 60 mg via SUBCUTANEOUS
  Filled 2024-01-26 (×12): qty 0.6

## 2024-01-26 MED ORDER — SODIUM CHLORIDE 0.9 % IV SOLN
1.0000 g | Freq: Once | INTRAVENOUS | Status: AC
  Administered 2024-01-26: 1 g via INTRAVENOUS
  Filled 2024-01-26: qty 10

## 2024-01-26 NOTE — ED Notes (Signed)
 Left message on VM to Ashley(daughter) that her mother was moved to room 12 per her request.

## 2024-01-26 NOTE — ED Notes (Signed)
 Patient reports chronic back continues to get worse. Patient arrived without support back brace in place. Explained the importance of wearing brace. Patient stated that it wasn't comfortable and it didn't fit.

## 2024-01-26 NOTE — Plan of Care (Signed)

## 2024-01-26 NOTE — ED Notes (Signed)
 Per pt's request, pt's daughter Kathleen [name] was called to give pt update. Call went to VM at this time. No msg left

## 2024-01-26 NOTE — Progress Notes (Signed)
 Patient wanted to eat at 2010 neb will be given later.

## 2024-01-26 NOTE — H&P (Addendum)
 History and Physical    Patient: Kathleen [name] YQM:[id number] DOB: [birth year] DOA: 01/25/2024 DOS: the patient was seen and examined on 01/26/2024 PCP: Bennet Brasil, MD  Patient coming from: Home  Chief Complaint:  Chief Complaint  Patient presents with   Back Pain   HPI: Kathleen [name] is a 63 y.o. female with medical history significant of complex medical history of the following: Adrenal insufficiency on chronic Cortef , avascular necrosis of left femoral head nonrepairable due to underlying severe pulmonary disease, restrictive lung disease secondary to sarcoidosis on chronic prednisone , chronic hypoxemic respiratory failure, GERD, hypothyroidism, mitral valve prolapse, chronic orthostatic hypotension on midodrine , chronic pain on narcotics followed by the [facility] pain clinic, vitamin D  deficiency anemia and chronic disease. No discharge from the facilit on 5/29 after admission for atypical chest pain.  She was found to have an acute L1 and subacute L3-L4 lumbar compression fracture.  Plans were to follow-up in the outpatient setting to pursue possible kyphoplasty treatment.  He returned to the ED on 5/31 via EMS complaining of lumbar pain.  He denied any falls or other trauma.  Evaluation in the ED revealed acute left pubic symphyseal and right sacral alla compression fractures.  Hospitalist service was asked to evaluate the patient for admission related to inability to mobilize in 3 days, and severe intractable pain.   Review of Systems: As mentioned in the history of present illness. All other systems reviewed and are negative.  Past Medical History:  Diagnosis Date   Adrenal insufficiency (HCC)    Anemia    Anxiety    Avascular bone necrosis (HCC)    Breast fibrocystic disorder    Chronic pain    Encounter for long-term opiate analgesic use 02/02/2020   Patient has chronic pain with femoral head avascular necrosis.  Is under pain contract   Fibromyalgia     Gastroesophageal reflux disease    Hypothyroidism    Mitral valve prolapse    Orthostatic hypotension    Peripheral neuropathy    PONV (postoperative nausea and vomiting)    Restrictive lung disease    Moderate to severe   Sarcoidosis    Biopsy proven - [facility]   Sarcoidosis    SOB (shortness of breath)    chronic   Past Surgical History:  Procedure Laterality Date   BIOPSY  10/17/2018   Procedure: BIOPSY;  Surgeon: Ruby Corporal, MD;  [facility];  Service: Endoscopy;;  duodenum, antrum, gastric body   BIOPSY  03/03/2021   Procedure: BIOPSY;  Surgeon: Ruby Corporal, MD;  [facility];  Service: Endoscopy;;   BIOPSY  09/11/2023   Procedure: BIOPSY;  Surgeon: Suzette Espy, MD;  [facility];  Service: Endoscopy;;   BREAST LUMPECTOMY Bilateral 01/12/2011   CHOLECYSTECTOMY N/A 04/17/2021   Procedure: LAPAROSCOPIC CHOLECYSTECTOMY;  Surgeon: Alanda Allegra, MD;  Location: [facility];  Service: General;  Laterality: N/A;   COLONOSCOPY WITH PROPOFOL  N/A 01/03/2022   Procedure: COLONOSCOPY WITH PROPOFOL ;  Surgeon: Ruby Corporal, MD;  [facility];  Service: Endoscopy;  Laterality: N/A;  220   ESOPHAGEAL DILATION N/A 03/03/2021   Procedure: ESOPHAGEAL DILATION;  Surgeon: Ruby Corporal, MD;  [facility];  Service: Endoscopy;  Laterality: N/A;   ESOPHAGOGASTRODUODENOSCOPY (EGD) WITH PROPOFOL  N/A 10/17/2018   Procedure: ESOPHAGOGASTRODUODENOSCOPY (EGD) WITH PROPOFOL ;  Surgeon: Ruby Corporal, MD;  [facility];  Service: Endoscopy;  Laterality: N/A;  2:25  ESOPHAGOGASTRODUODENOSCOPY (EGD) WITH PROPOFOL  N/A 03/03/2021   Procedure: ESOPHAGOGASTRODUODENOSCOPY (EGD) WITH PROPOFOL ;  Surgeon: Ruby Corporal, MD;  [facility];  Service: Endoscopy;  Laterality: N/A;  12:15   ESOPHAGOGASTRODUODENOSCOPY (EGD) WITH PROPOFOL  N/A 09/11/2023   Procedure: ESOPHAGOGASTRODUODENOSCOPY (EGD) WITH PROPOFOL ;  Surgeon: Suzette Espy, MD;  [facility];  Service: Endoscopy;  Laterality: N/A;  2:30 pm, asa 3, LM to see if pt can come earlier   [facility] DILATION N/A 09/11/2023   Procedure: Londa Rival DILATION;  Surgeon: Suzette Espy, MD;  [facility];  Service: Endoscopy;  Laterality: N/A;   ORIF ANKLE FRACTURE Left 03/10/2023   Procedure: OPEN REDUCTION INTERNAL FIXATION (ORIF) ANKLE FRACTURE;  Surgeon: Diedra Fowler, MD;  Location: [facility];  Service: Orthopedics;  Laterality: Left;   POLYPECTOMY  01/03/2022   Procedure: POLYPECTOMY INTESTINAL;  Surgeon: Ruby Corporal, MD;  [facility];  Service: Endoscopy;;   TUBAL LIGATION     Social History:  reports that she has never smoked. She has never been exposed to tobacco smoke. She has never used smokeless tobacco. She reports that she does not drink alcohol and does not use drugs.  Allergies  Allergen Reactions   Bee Venom Anaphylaxis   Contrast Media [Iodinated Contrast Media] Other (See Comments)    CT chest w/ contrast at OSH followed by SOB resolved w/ single dose steroids, never ENT swelling, intubation or pressors, has had multiple contrasted scans since   Morphine  Other (See Comments)    Substance with morphinan structure and opioid receptor agonist mechanism of action (substance)   Bactrim  [Sulfamethoxazole -Trimethoprim ] Nausea And Vomiting   Codeine Itching    Family History  Problem Relation Age of Onset   Cardiomyopathy Mother    Breast cancer Mother    Prostate cancer Father    Heart attack Maternal Grandmother    Heart attack Maternal Grandfather    Heart attack Paternal Grandmother    Bipolar disorder Daughter    Heart disease Maternal Aunt    Neurofibromatosis Cousin    Colon cancer Neg Hx     Prior to Admission medications   Medication Sig Start Date End Date Taking? Authorizing Provider  acetaminophen  (TYLENOL ) 500 MG tablet Take 2 tablets (1,000 mg total) by mouth 3 (three) times daily. 01/08/24    Mason Sole, Pratik D, DO  albuterol  (PROVENTIL ) (2.5 MG/3ML) 0.083% nebulizer solution Inhale 3 mL (2.5 mg total) by nebulization every four (4) hours as needed for wheezing or shortness of breath. 01/23/24   Colin Dawley, MD  budesonide  (PULMICORT ) 0.5 MG/2ML nebulizer solution Inhale the contents of 1 vial via nebulizer two (2) times a day. 01/23/24   Colin Dawley, MD  busPIRone  (BUSPAR ) 15 MG tablet Take 1 tablet (15 mg) by mouth 3 times daily. 01/23/24   Colin Dawley, MD  calcitonin, salmon, (MIACALCIN Marciano Settles) 200 UNIT/ACT nasal spray Use 1 spray in one nostril per day, alternate nostrils every other day (may have 1 vial she should use this for at least 2 weeks but may use as long as 4 weeks) 01/10/24   Fairy Homer, Jackelyn Marvel, MD  cyclobenzaprine  (FLEXERIL ) 5 MG tablet Take 1 tablet (5 mg total) by mouth 3 (three) times daily. 01/23/24   Colin Dawley, MD  DULoxetine  (CYMBALTA ) 60 MG capsule Take 1 capsule (60 mg total) by mouth daily. 01/23/24   Colin Dawley, MD  EPINEPHrine  0.3 mg/0.3 mL IJ SOAJ injection Inject 0.3 mg into the muscle as needed for anaphylaxis.  01/05/22   Bennet Brasil, MD  ferrous sulfate  325 (65 FE) MG EC tablet Take 325 mg by mouth once a week.    [provider]  furosemide  (LASIX ) 20 MG tablet Take 1 tablet (20 mg total) by mouth every Monday, Wednesday, and Friday. 07/17/23   Bennet Brasil, MD  hydrocortisone  (CORTEF ) 10 MG tablet Take 2 tablets by mouth at 8:00 am and 1 tablet at lunch. (May double dose on sick days x 3-5 days) 11/14/23   Nida, Gebreselassie W, MD  hydrOXYzine  (ATARAX ) 25 MG tablet Take 1 tablet (25 mg total) by mouth 3 (three) times daily as needed for anxiety. 12/02/23 12/01/24  Bennet Brasil, MD  ipratropium-albuterol  (DUONEB) 0.5-2.5 (3) MG/3ML SOLN Take 3 mLs by nebulization every 8 (eight) hours. 01/23/24   Colin Dawley, MD  lactulose  (CHRONULAC ) 10 GM/15ML solution Take 15 mLs (10 g total) by mouth daily as needed for mild constipation.  01/23/24   Colin Dawley, MD  levothyroxine  (SYNTHROID ) 50 MCG tablet Take 1 tablet by mouth daily before breakfast. 11/22/23 05/20/24  Nida, Gebreselassie W, MD  lidocaine  (LIDODERM ) 5 % Place 1 patch onto the skin daily. Remove & Discard patch within 12 hours or as directed by MD 04/09/23   Doreene Gammon D, DO  LINZESS  72 MCG capsule Take 1 capsule (72 mcg total) by mouth daily before breakfast. 01/23/24   Colin Dawley, MD  midodrine  (PROAMATINE ) 5 MG tablet Take 1 tablet (5 mg total) by mouth 2 (two) times daily with a meal. 06/19/23 06/18/24  Bennet Brasil, MD  ondansetron  (ZOFRAN -ODT) 8 MG disintegrating tablet Dissolve 1 tablet (8 mg total) by mouth every 8 (eight) hours as needed. Patient taking differently: Take 8 mg by mouth every 8 (eight) hours as needed for nausea or vomiting. 04/23/23 04/22/24  Bennet Brasil, MD  oxyCODONE  (ROXICODONE ) 15 MG immediate release tablet Take 1 tablet (15 mg total) by mouth every 4 (four) hours as needed for moderate pain (pain score 4-6) or severe pain (pain score 7-10). 01/23/24   Colin Dawley, MD  OXYGEN  Inhale 5 L into the lungs continuous.    [provider]  pantoprazole  (PROTONIX ) 40 MG tablet Take 1 tablet (40 mg total) by mouth daily. 01/23/24 01/22/25  Colin Dawley, MD  polyethylene glycol (MIRALAX  / GLYCOLAX ) 17 g packet Take 17 g by mouth 2 (two) times daily. 01/23/24   Colin Dawley, MD  predniSONE  (DELTASONE ) 5 MG tablet Take 1 tablet (5 mg total) by mouth in the morning. 08/01/23     rosuvastatin  (CRESTOR ) 20 MG tablet Take 1 tablet (20 mg total) by mouth daily. 08/12/23   Bennet Brasil, MD  senna-docusate (SENOKOT-S) 8.6-50 MG tablet Take 2 tablets by mouth at bedtime. 01/23/24   Colin Dawley, MD  tamsulosin  (FLOMAX ) 0.4 MG CAPS capsule Take 1 capsule (0.4 mg total) by mouth daily after supper. 01/10/24   Bennet Brasil, MD  Vitamin D , Ergocalciferol , (DRISDOL ) 1.25 MG (50000 UNIT) CAPS capsule Take 1 capsule (50,000  Units total) by mouth every Sunday. 01/26/24   Colin Dawley, MD    Physical Exam: Vitals:   01/26/24 0155 01/26/24 0200 01/26/24 0300 01/26/24 0350  BP: (!) 127/54 (!) 112/56 (!) 136/56 (!) 123/41  Pulse: 93 92 91 96  Resp: 20 18  20   Temp:    98.2 F (36.8 C)  TempSrc:      SpO2: 97% 97% 99% 99%  Weight:      Height:  Constitutional: NAD, calm, comfortable Respiratory: clear to auscultation bilaterally, no wheezing, no crackles. Normal respiratory effort. No accessory muscle use. 4L Cardiovascular: Regular rate and rhythm, no murmurs / rubs / gallops. No extremity edema. 2+ pedal pulses.  Abdomen: no tenderness, no masses palpated. No hepatosplenomegaly. Bowel sounds positive.  Musculoskeletal: no clubbing / cyanosis. No joint deformity upper and lower extremities. Good ROM, no contractures. Normal muscle tone..  Tender over her lower back. Skin: no rashes, lesions, ulcers. No induration Neurologic: CN 2-12 grossly intact. Sensation intact,  Strength 3-4/5 x all 4 extremities pain.  Psychiatric: Normal judgment and insight. Alert and oriented x 3.  Anxious mood.     Data Reviewed:  Sodium 141, potassium 3.7, CO2 33, glucose 118, BUN 11, creatinine 0.72  Phosphorus 3.9, magnesium  2.2, LFTs are normal  Bilirubin 2.8 with total protein 5.5  Previous vitamin D  level low at 9.65  White count normal thousand and differential not obtained, hemoglobin 8.8, platelets 123,000  Assessment and Plan: Intractable pain /Chronic pain syndrome Acute left pubic symphyseal and left sacral alar fracture Previous superior endplate fractures at L1-L3 and L4 Left femoral head avascular necrosis-not surgical candidate Continue home narcotics Dilaudid  1 mg IV every 3 hours as needed severe pain Initiate Lyrica  25 mg 3 times daily Continue home dose Flexeril  Continue calcitonin for compression fractures IR has been consulted to evaluate for possible kyphoplasty during this  admission Physical therapy evaluation Lidoderm  patch  Vitamin D  deficiency Continue supplementation  Abnormal urinalysis/? UTI Urine culture pending Empiric Rocephin   Chronic hypoxic respiratory failure due to interstitial lung disease secondary to sarcoidosis Chronic prednisone  Supportive care with oxygen  Continue prednisone  Continue albuterol  nebs as needed for shortness of breath and wheezing Scheduled budesonide  nebs  Anemia of chronic disease Current hemoglobin 8.8 Obtain anemia panel  Protein calorie malnutrition Albumin 2.8 and total protein 5.5 Nutrition consultation  Hypothyroidism TSH normal Continue Synthroid   Adrenal insufficiency Continue Cortef   Chronic orthostatic hypotension On midodrine      Advance Care Planning:   Code Status: Full Code   VTE prophylaxis: Lovenox   Consults: Interventional radiology  Family Communication: Patient only  Severity of Illness: The appropriate patient status for this patient is INPATIENT. Inpatient status is judged to be reasonable and necessary in order to provide the required intensity of service to ensure the patient's safety. The patient's presenting symptoms, physical exam findings, and initial radiographic and laboratory data in the context of their chronic comorbidities is felt to place them at high risk for further clinical deterioration. Furthermore, it is not anticipated that the patient will be medically stable for discharge from the hospital within 2 midnights of admission.   * I certify that at the point of admission it is my clinical judgment that the patient will require inpatient hospital care spanning beyond 2 midnights from the point of admission due to high intensity of service, high risk for further deterioration and high frequency of surveillance required.*  Author: Kathye Parkin, NP 01/26/2024 6:42 AM  For on call review [email or web address].

## 2024-01-27 ENCOUNTER — Encounter (HOSPITAL_COMMUNITY): Payer: Self-pay | Admitting: Internal Medicine

## 2024-01-27 ENCOUNTER — Other Ambulatory Visit (HOSPITAL_COMMUNITY): Payer: Self-pay

## 2024-01-27 ENCOUNTER — Inpatient Hospital Stay: Admitting: Family Medicine

## 2024-01-27 DIAGNOSIS — J849 Interstitial pulmonary disease, unspecified: Secondary | ICD-10-CM | POA: Diagnosis not present

## 2024-01-27 DIAGNOSIS — E271 Primary adrenocortical insufficiency: Secondary | ICD-10-CM | POA: Diagnosis not present

## 2024-01-27 DIAGNOSIS — M5440 Lumbago with sciatica, unspecified side: Secondary | ICD-10-CM | POA: Diagnosis not present

## 2024-01-27 DIAGNOSIS — M87052 Idiopathic aseptic necrosis of left femur: Secondary | ICD-10-CM

## 2024-01-27 DIAGNOSIS — E039 Hypothyroidism, unspecified: Secondary | ICD-10-CM

## 2024-01-27 DIAGNOSIS — M5441 Lumbago with sciatica, right side: Secondary | ICD-10-CM

## 2024-01-27 DIAGNOSIS — G8929 Other chronic pain: Secondary | ICD-10-CM

## 2024-01-27 LAB — BASIC METABOLIC PANEL WITH GFR
Anion gap: 9 (ref 5–15)
BUN: 8 mg/dL (ref 8–23)
CO2: 29 mmol/L (ref 22–32)
Calcium: 8.8 mg/dL — ABNORMAL LOW (ref 8.9–10.3)
Chloride: 103 mmol/L (ref 98–111)
Creatinine, Ser: 0.81 mg/dL (ref 0.44–1.00)
GFR, Estimated: >60 mL/min (ref >60)
Glucose, Bld: 117 mg/dL — ABNORMAL HIGH (ref 70–99)
Potassium: 3.9 mmol/L (ref 3.5–5.1)
Sodium: 141 mmol/L (ref 135–145)

## 2024-01-27 LAB — CBC
HCT: 31 % — ABNORMAL LOW (ref 36.0–46.0)
Hemoglobin: 9.1 g/dL — ABNORMAL LOW (ref 12.0–15.0)
MCH: 29.1 pg (ref 26.0–34.0)
MCHC: 29.4 g/dL — ABNORMAL LOW (ref 30.0–36.0)
MCV: 99 fL (ref 80.0–100.0)
Platelets: 388 10*3/uL (ref 150–400)
RBC: 3.13 MIL/uL — ABNORMAL LOW (ref 3.87–5.11)
RDW: 16.2 % — ABNORMAL HIGH (ref 11.5–15.5)
WBC: 9.7 10*3/uL (ref 4.0–10.5)
nRBC: 0.4 % — ABNORMAL HIGH (ref 0.0–0.2)

## 2024-01-27 MED ORDER — ADULT MULTIVITAMIN W/MINERALS CH
1.0000 | ORAL_TABLET | Freq: Every day | ORAL | Status: DC
  Administered 2024-01-27 – 2024-01-29 (×3): 1 via ORAL
  Filled 2024-01-27 (×3): qty 1

## 2024-01-27 MED ORDER — IPRATROPIUM-ALBUTEROL 0.5-2.5 (3) MG/3ML IN SOLN
3.0000 mL | Freq: Two times a day (BID) | RESPIRATORY_TRACT | Status: DC
  Administered 2024-01-27 – 2024-01-28 (×3): 3 mL via RESPIRATORY_TRACT
  Filled 2024-01-27 (×3): qty 3

## 2024-01-27 MED ORDER — CYCLOBENZAPRINE HCL 5 MG PO TABS
5.0000 mg | ORAL_TABLET | Freq: Three times a day (TID) | ORAL | Status: DC
  Administered 2024-01-27 – 2024-02-06 (×30): 5 mg via ORAL
  Filled 2024-01-27 (×30): qty 1

## 2024-01-27 MED ORDER — ENSURE PLUS HIGH PROTEIN PO LIQD
237.0000 mL | Freq: Two times a day (BID) | ORAL | Status: DC
  Administered 2024-01-27 – 2024-02-03 (×3): 237 mL via ORAL

## 2024-01-27 NOTE — TOC Initial Note (Addendum)
 Transition of Care [facility]) - Initial/Assessment Note    Patient Details  Name: Kathleen [name] MRN: [id number] Date of Birth: [birth year]  [facility] CM/SW Contact:    Cyndie Dredge, LCSWA Phone Number: 01/27/2024, 12:01 PM  Clinical Narrative:                  Patient is at risk for readmission due to high admission score. Patient was admitted for lower back pain. Writer spoke with patient and assessed her. Patient lives at home with spouse, has a 3N1, walker, and WC at home. Patient shared that her husband cares for her and does the driving. Writer asked patient about in home care services and patient shared that Gasper Karst was scheduled to come out. Writer reached out to [place] to confirm services and see if the office can still  provided services since PT recommends HHPT.  Randel Buss confirmed that patient is still active for PT/RN and they will continue services. TOC to follow.   Expected Discharge Plan: Home w Home Health Services Barriers to Discharge: Continued Medical Work up   Patient Goals and CMS Choice Patient states their goals for this hospitalization and ongoing recovery are:: return back home with [facility] CMS Medicare.gov Compare Post Acute Care list provided to:: Patient Choice offered to / list presented to : Patient      Expected Discharge Plan and Services In-house Referral: Clinical Social Work Discharge Planning Services: CM Consult Post Acute Care Choice: Durable Medical Equipment, Home Health Living arrangements for the past 2 months: Single Family Home                           HH Arranged: RN, PT, OT HH Agency: [facility] Date [facility] Agency Contacted: 01/27/24 Time HH Agency Contacted: 1201 Representative spoke with at [facility] Agency: Randel Buss  Prior Living Arrangements/Services Living arrangements for the past 2 months: Single Family Home Lives with:: Spouse Patient language and need for interpreter reviewed:: Yes Do you feel safe going back to  the place where you live?: Yes      Need for Family Participation in Patient Care: No (Comment) Care giver support system in place?: Yes (comment) Current home services: DME, Home PT Criminal Activity/Legal Involvement Pertinent to Current Situation/Hospitalization: No - Comment as needed  Activities of Daily Living   ADL Screening (condition at time of admission) Independently performs ADLs?: No Does the patient have a NEW difficulty with bathing/dressing/toileting/self-feeding that is expected to last >3 days?: No Does the patient have a NEW difficulty with getting in/out of bed, walking, or climbing stairs that is expected to last >3 days?: No Does the patient have a NEW difficulty with communication that is expected to last >3 days?: No Is the patient deaf or have difficulty hearing?: No Does the patient have difficulty seeing, even when wearing glasses/contacts?: No Does the patient have difficulty concentrating, remembering, or making decisions?: No  Permission Sought/Granted      Share Information with NAME: Kathleen [name]     Permission granted to share info w Relationship: Patient     Emotional Assessment Appearance:: Appears stated age Attitude/Demeanor/Rapport: Engaged Affect (typically observed): Appropriate, Stable Orientation: : Oriented to Self, Oriented to Place, Oriented to  Time, Oriented to Situation Alcohol / Substance Use: Not Applicable Psych Involvement: No (comment)  Admission diagnosis:  Lower back pain [M54.50] Patient Active Problem List   Diagnosis Date Noted   Lower back pain 01/26/2024  Acute L1 and Subacute L3/L4 Lumbar compression fracture (HCC) 01/21/2024   Intractable low back pain 01/03/2024   Ambulatory dysfunction 12/11/2023   Uncontrolled pain 12/08/2023   Pressure injury of skin 12/08/2023   Intractable back pain 12/07/2023   Prediabetes 10/28/2023   Ankle fracture, bimalleolar, closed, left, sequela 03/10/2023   Closed fracture of  left ankle 03/10/2023   Syndesmotic disruption of ankle, left, initial encounter 03/10/2023   Lumbar transverse process fracture (HCC) 01/02/2023   Acute on chronic hypoxic respiratory failure (HCC) 12/13/2022   Depression, major, single episode, moderate (HCC) 09/10/2022   Aortic atherosclerosis (HCC) 09/10/2022   Chronic respiratory failure with hypoxia (HCC) 05/22/2021   Anxiety and depression 05/22/2021   Vitamin D  deficiency 05/09/2021   Left foot drop 01/09/2021   Pulmonary HTN (HCC) 10/12/2020   Encounter for long-term opiate analgesic use 02/02/2020   Interstitial lung disease (HCC) 04/14/2019   GERD (gastroesophageal reflux disease) 10/15/2017   Personal history of noncompliance with medical treatment, presenting hazards to health 04/26/2017   Hypothyroidism 01/15/2017   Adrenal insufficiency (Addison's disease) (HCC) 08/22/2016   Symptomatic bradycardia    Adrenal insufficiency (HCC) 08/17/2016   Hypokalemia 08/17/2016   Chronic pain syndrome 04/20/2016   Irritable bowel syndrome with constipation 11/14/2015   Avascular necrosis of bone of hip, left (HCC) 04/05/2015   Post herpetic neuralgia 10/22/2014   Mixed hyperlipidemia 08/25/2014   Obstructive sleep apnea 02/18/2014   Fibromyalgia 02/11/2013   Bilateral lower extremity edema 01/22/2013   Hypotension 01/16/2013   Fibrocystic breast disease in female 04/09/2012   Sarcoidosis 06/21/2008   PCP:  Bennet Brasil, MD Pharmacy:   [facility] [phone] - [place], [place] - [address] AT [facility] [address] [place] [address] Phone: [phone] Fax: [phone]  [place] - [facility] [address] [place] [address] Phone: [phone] Fax: [phone]  [place] - [facility] [address], [address] [place] [address] Phone: [phone] Fax: [phone]     Social Drivers of Health (SDOH) Social  History: SDOH Screenings   Food Insecurity: No Food Insecurity (01/26/2024)  Housing: Low Risk  (01/26/2024)  Transportation Needs: No Transportation Needs (01/26/2024)  Utilities: Not At Risk (01/26/2024)  Alcohol Screen: Low Risk  (09/27/2023)  Depression (PHQ2-9): Low Risk  (01/01/2024)  Financial Resource Strain: Low Risk  (09/27/2023)  Physical Activity: Inactive (09/27/2023)  Social Connections: Socially Integrated (01/21/2024)  Stress: No Stress Concern Present (09/27/2023)  Tobacco Use: Low Risk  (01/25/2024)  Health Literacy: Medium Risk (12/20/2023)   Received from [facility]   SDOH Interventions:     Readmission Risk Interventions    01/27/2024   11:48 AM 12/11/2023   11:11 AM 04/08/2023   10:38 AM  Readmission Risk Prevention Plan  Transportation Screening Complete Complete Complete  HRI or Home Care Consult  Complete   Social Work Consult for Recovery Care Planning/Counseling  Complete   Palliative Care Screening  Not Applicable   Medication Review Oceanographer) Complete Complete Complete  HRI or Home Care Consult Complete  Complete  SW Recovery Care/Counseling Consult Complete  Complete  Palliative Care Screening Not Applicable  Not Applicable  Skilled Nursing Facility Patient Refused  Not Applicable

## 2024-01-27 NOTE — Consult Note (Signed)
 Chief Complaint: Superior endplate fractures of L3 and L4. Request is for L3/L4 Kyphoplasty   Referring Physician(s): Dr. Quintella Buck Courage  Supervising Physician: Luellen Sages  Patient Status: Kathleen [name] - inpatient  History of Present Illness: Kathleen [name] is a 63 y.o. female History of morbid obesity, chronic hypoxic respiratory failure (on 5L via Leslie baseline), interstitial lung disease, biopsy proved sarcoidosis (on steroids), Addison's disease, osteoporosis, bilateral hip AVN, known left femoral head avascular necrosis ( not a surgical candidate) and chronic urinary incontinence.  Recently admitted for the following: L4 fracture on 4.12.25 through 4.16.25  L3 fracture from 5.9.25 through 5.14.25  L1 fracture on 5.26.25 through 5.29.25 Presented to the ED at [facility] on 5.31.25 with back pain. CT Lumbar Spine from 5.31.25  reads  Superior endplate fracture of L1 is again seen and appears stable with proximally 10-15% loss of height and no retropulsion. This is new since MRI examination of 01/03/2024 and is acute to subacute in nature.Stable superior endplate fractures of L3 and L4 are also no 25-30% loss of height and minimal (2-3 mm) retropulsion of the posterosuperior aspect of the vertebral bodies without significant associated canal stenosis.  Per CT Pelvis from 5.31.25 the patient was also found to have a sacral and left pubic symphyseal fracture. CT reads  Acute left pubic symphyseal and right sacral alar compression fractures in keeping with a lateral compression type 1 injury.  Team is requesting evaluation for Lumbar Kyphoplasty. Case reviewed and  tentatively approved by [facility] Attending Dr. Jory Ng  or L3/L4 Kyphoplasty be performed under general anesthesia..   Patient alert siting up in recliner visibly short of breath. Endorses back pain and hip pain. Denies any fevers, headache, chest pain, SOB, cough, abdominal pain, nausea, vomiting or bleeding.   No recent INR.  Labs from 6.1 show Albumin 2.8, total protein 5.5. All other labs are within acceptable parameters. Patient is  Patient is on subcutaneous prophylactic dose of lovenox .   Return precautions and treatment recommendations and follow-up discussed with the patient and her daughter Kathleen [name] via the telephone. Both who verbalized understanding of the plan of care and agreeable    Past Medical History:  Diagnosis Date   Adrenal insufficiency (HCC)    Anemia    Anxiety    Avascular bone necrosis (HCC)    Breast fibrocystic disorder    Chronic pain    Encounter for long-term opiate analgesic use 02/02/2020   Patient has chronic pain with femoral head avascular necrosis.  Is under pain contract   Fibromyalgia    Gastroesophageal reflux disease    Hypothyroidism    Mitral valve prolapse    Orthostatic hypotension    Peripheral neuropathy    PONV (postoperative nausea and vomiting)    Restrictive lung disease    Moderate to severe   Sarcoidosis    Biopsy proven - [facility]   Sarcoidosis    SOB (shortness of breath)    chronic    Past Surgical History:  Procedure Laterality Date   BIOPSY  10/17/2018   Procedure: BIOPSY;  Surgeon: Ruby Corporal, MD;  [facility]: [facility];  Service: Endoscopy;;  duodenum, antrum, gastric body   BIOPSY  03/03/2021   Procedure: BIOPSY;  Surgeon: Ruby Corporal, MD;  [facility]: [facility];  Service: Endoscopy;;   BIOPSY  09/11/2023   Procedure: BIOPSY;  Surgeon: Suzette Espy, MD;  [facility]: [facility];  Service: Endoscopy;;   BREAST LUMPECTOMY Bilateral 01/12/2011   CHOLECYSTECTOMY  N/A 04/17/2021   Procedure: LAPAROSCOPIC CHOLECYSTECTOMY;  Surgeon: Alanda Allegra, MD;  Location: [facility];  Service: General;  Laterality: N/A;   COLONOSCOPY WITH PROPOFOL  N/A 01/03/2022   Procedure: COLONOSCOPY WITH PROPOFOL ;  Surgeon: Ruby Corporal, MD;  [facility]: [facility];  Service: Endoscopy;  Laterality: N/A;  220   ESOPHAGEAL DILATION N/A 03/03/2021    Procedure: ESOPHAGEAL DILATION;  Surgeon: Ruby Corporal, MD;  [facility]: [facility];  Service: Endoscopy;  Laterality: N/A;   ESOPHAGOGASTRODUODENOSCOPY (EGD) WITH PROPOFOL  N/A 10/17/2018   Procedure: ESOPHAGOGASTRODUODENOSCOPY (EGD) WITH PROPOFOL ;  Surgeon: Ruby Corporal, MD;  [facility];  Service: Endoscopy;  Laterality: N/A;  2:25   ESOPHAGOGASTRODUODENOSCOPY (EGD) WITH PROPOFOL  N/A 03/03/2021   Procedure: ESOPHAGOGASTRODUODENOSCOPY (EGD) WITH PROPOFOL ;  Surgeon: Ruby Corporal, MD;  [facility];  Service: Endoscopy;  Laterality: N/A;  12:15   ESOPHAGOGASTRODUODENOSCOPY (EGD) WITH PROPOFOL  N/A 09/11/2023   Procedure: ESOPHAGOGASTRODUODENOSCOPY (EGD) WITH PROPOFOL ;  Surgeon: Suzette Espy, MD;  [facility];  Service: Endoscopy;  Laterality: N/A;  2:30 pm, asa 3, LM to see if pt can come earlier   [facility] DILATION N/A 09/11/2023   Procedure: Londa Rival DILATION;  Surgeon: Suzette Espy, MD;  [facility];  Service: Endoscopy;  Laterality: N/A;   ORIF ANKLE FRACTURE Left 03/10/2023   Procedure: OPEN REDUCTION INTERNAL FIXATION (ORIF) ANKLE FRACTURE;  Surgeon: Diedra Fowler, MD;  Location: [facility];  Service: Orthopedics;  Laterality: Left;   POLYPECTOMY  01/03/2022   Procedure: POLYPECTOMY INTESTINAL;  Surgeon: Ruby Corporal, MD;  [facility];  Service: Endoscopy;;   TUBAL LIGATION      Allergies: Bee venom, Contrast media [iodinated contrast media], Morphine , Bactrim  [sulfamethoxazole -trimethoprim ], and Codeine  Medications: Prior to Admission medications   Medication Sig Start Date End Date Taking? Authorizing Provider  acetaminophen  (TYLENOL ) 500 MG tablet Take 2 tablets (1,000 mg total) by mouth 3 (three) times daily. 01/08/24  Yes Mason Sole, Pratik D, DO  albuterol  (PROVENTIL ) (2.5 MG/3ML) 0.083% nebulizer solution Inhale 3 mL (2.5 mg total) by nebulization every four (4) hours as needed for wheezing or shortness of breath.  01/23/24  Yes Emokpae, Courage, MD  budesonide  (PULMICORT ) 0.5 MG/2ML nebulizer solution Inhale the contents of 1 vial via nebulizer two (2) times a day. 01/23/24  Yes Emokpae, Courage, MD  busPIRone  (BUSPAR ) 15 MG tablet Take 1 tablet (15 mg) by mouth 3 times daily. 01/23/24  Yes Colin Dawley, MD  calcitonin, salmon, (MIACALCIN /FORTICAL) 200 UNIT/ACT nasal spray Use 1 spray in one nostril per day, alternate nostrils every other day (may have 1 vial she should use this for at least 2 weeks but may use as long as 4 weeks) 01/10/24  Yes Luking, Jackelyn Marvel, MD  cyclobenzaprine  (FLEXERIL ) 5 MG tablet Take 1 tablet (5 mg total) by mouth 3 (three) times daily. 01/23/24  Yes Emokpae, Courage, MD  DULoxetine  (CYMBALTA ) 60 MG capsule Take 1 capsule (60 mg total) by mouth daily. 01/23/24  Yes Emokpae, Courage, MD  EPINEPHrine  0.3 mg/0.3 mL IJ SOAJ injection Inject 0.3 mg into the muscle as needed for anaphylaxis. 01/05/22  Yes Luking, Jackelyn Marvel, MD  ferrous sulfate  325 (65 FE) MG EC tablet Take 325 mg by mouth once a week.   Yes [provider]  furosemide  (LASIX ) 20 MG tablet Take 1 tablet (20 mg total) by mouth every Monday, Wednesday, and Friday. 07/17/23  Yes Bennet Brasil, MD  hydrocortisone  (CORTEF ) 10  MG tablet Take 2 tablets by mouth at 8:00 am and 1 tablet at lunch. (May double dose on sick days x 3-5 days) 11/14/23  Yes Nida, Gebreselassie W, MD  hydrOXYzine  (ATARAX ) 25 MG tablet Take 1 tablet (25 mg total) by mouth 3 (three) times daily as needed for anxiety. 12/02/23 12/01/24 Yes Luking, Jackelyn Marvel, MD  ipratropium-albuterol  (DUONEB) 0.5-2.5 (3) MG/3ML SOLN Take 3 mLs by nebulization every 8 (eight) hours. 01/23/24  Yes Emokpae, Courage, MD  lactulose  (CHRONULAC ) 10 GM/15ML solution Take 15 mLs (10 g total) by mouth daily as needed for mild constipation. 01/23/24  Yes Colin Dawley, MD  levothyroxine  (SYNTHROID ) 50 MCG tablet Take 1 tablet by mouth daily before breakfast. 11/22/23 05/20/24 Yes Nida,  Jaynee Meyer, MD  lidocaine  (LIDODERM ) 5 % Place 1 patch onto the skin daily. Remove & Discard patch within 12 hours or as directed by MD 04/09/23  Yes Mason Sole, Pratik D, DO  LINZESS  72 MCG capsule Take 1 capsule (72 mcg total) by mouth daily before breakfast. 01/23/24  Yes Emokpae, Courage, MD  midodrine  (PROAMATINE ) 5 MG tablet Take 1 tablet (5 mg total) by mouth 2 (two) times daily with a meal. 06/19/23 06/18/24 Yes Luking, Jackelyn Marvel, MD  ondansetron  (ZOFRAN -ODT) 8 MG disintegrating tablet Dissolve 1 tablet (8 mg total) by mouth every 8 (eight) hours as needed. Patient taking differently: Take 8 mg by mouth every 8 (eight) hours as needed for nausea or vomiting. 04/23/23 04/22/24 Yes Luking, Jackelyn Marvel, MD  oxyCODONE  (ROXICODONE ) 15 MG immediate release tablet Take 1 tablet (15 mg total) by mouth every 4 (four) hours as needed for moderate pain (pain score 4-6) or severe pain (pain score 7-10). 01/23/24  Yes Emokpae, Courage, MD  OXYGEN  Inhale 5 L into the lungs continuous.   Yes [provider]  pantoprazole  (PROTONIX ) 40 MG tablet Take 1 tablet (40 mg total) by mouth daily. 01/23/24 01/22/25 Yes Emokpae, Courage, MD  polyethylene glycol (MIRALAX  / GLYCOLAX ) 17 g packet Take 17 g by mouth 2 (two) times daily. 01/23/24  Yes Colin Dawley, MD  predniSONE  (DELTASONE ) 5 MG tablet Take 1 tablet (5 mg total) by mouth in the morning. 08/01/23  Yes   rosuvastatin  (CRESTOR ) 20 MG tablet Take 1 tablet (20 mg total) by mouth daily. 08/12/23  Yes Luking, Scott A, MD  senna-docusate (SENOKOT-S) 8.6-50 MG tablet Take 2 tablets by mouth at bedtime. 01/23/24  Yes Emokpae, Courage, MD  tamsulosin  (FLOMAX ) 0.4 MG CAPS capsule Take 1 capsule (0.4 mg total) by mouth daily after supper. 01/10/24  Yes Luking, Jackelyn Marvel, MD  Vitamin D , Ergocalciferol , (DRISDOL ) 1.25 MG (50000 UNIT) CAPS capsule Take 1 capsule (50,000 Units total) by mouth every Sunday. 01/26/24  Yes Colin Dawley, MD     Family History  Problem Relation  Age of Onset   Cardiomyopathy Mother    Breast cancer Mother    Prostate cancer Father    Heart attack Maternal Grandmother    Heart attack Maternal Grandfather    Heart attack Paternal Grandmother    Bipolar disorder Daughter    Heart disease Maternal Aunt    Neurofibromatosis Cousin    Colon cancer Neg Hx     Social History   Socioeconomic History   Marital status: Married    Spouse name: Not on file   Number of children: 1   Years of education: Not on file   Highest education level: Not on file  Occupational History   Occupation: Accounts Payable  Employer: [employer]  Tobacco Use   Smoking status: Never    Passive exposure: Never   Smokeless tobacco: Never  Vaping Use   Vaping status: Never Used  Substance and Sexual Activity   Alcohol use: No   Drug use: No   Sexual activity: Yes    Birth control/protection: Post-menopausal  Other Topics Concern   Not on file  Social History Narrative   Daily caffeine    Living with husband   Right handed   She has been on disability since 2014.    Social Drivers of Corporate investment banker Strain: Low Risk  (09/27/2023)   Overall Financial Resource Strain (CARDIA)    Difficulty of Paying Living Expenses: Not hard at all  Food Insecurity: No Food Insecurity (01/26/2024)   Hunger Vital Sign    Worried About Running Out of Food in the Last Year: Never true    Ran Out of Food in the Last Year: Never true  Transportation Needs: No Transportation Needs (01/26/2024)   PRAPARE - Administrator, Civil Service (Medical): No    Lack of Transportation (Non-Medical): No  Physical Activity: Inactive (09/27/2023)   Exercise Vital Sign    Days of Exercise per Week: 0 days    Minutes of Exercise per Session: 0 min  Stress: No Stress Concern Present (09/27/2023)   [employer] of Occupational Health - Occupational Stress Questionnaire    Feeling of Stress : Only a little  Social Connections: Socially Integrated  (01/21/2024)   Social Connection and Isolation Panel [NHANES]    Frequency of Communication with Friends and Family: More than three times a week    Frequency of Social Gatherings with Friends and Family: More than three times a week    Attends Religious Services: More than 4 times per year    Active Member of [employer] or Organizations: Yes    Attends Engineer, structural: More than 4 times per year    Marital Status: Married    Review of Systems: A 12 point ROS discussed and pertinent positives are indicated in the HPI above.  All other systems are negative.  Review of Systems  Constitutional:  Negative for fatigue and fever.  HENT:  Negative for congestion.   Respiratory:  Negative for cough and shortness of breath.   Gastrointestinal:  Negative for abdominal pain, diarrhea, nausea and vomiting.  Musculoskeletal:  Positive for myalgias.       Back pain and hip pain.     Vital Signs: BP (!) 120/106 (BP Location: Left Arm)   Pulse 93   Temp 98.2 F (36.8 C)   Resp 19   Ht 5\' 10"  (1.778 m)   Wt 277 lb (125.6 kg)   LMP 05/17/2016   SpO2 98%   BMI 39.75 kg/m     Physical Exam Vitals and nursing note reviewed.  Constitutional:      Appearance: She is well-developed. She is obese.  HENT:     Head: Normocephalic and atraumatic.  Eyes:     Conjunctiva/sclera: Conjunctivae normal.  Cardiovascular:     Rate and Rhythm: Normal rate.     Heart sounds:     Friction rub: use of accessory muscles..  Pulmonary:     Breath sounds: Wheezing present.  Musculoskeletal:        General: Normal range of motion.  Skin:    General: Skin is warm.  Neurological:     General: No focal deficit present.  Mental Status: She is alert and oriented to person, place, and time. Mental status is at baseline.  Psychiatric:        Mood and Affect: Mood normal.        Behavior: Behavior normal.        Thought Content: Thought content normal.        Judgment: Judgment normal.      Imaging: CT PELVIS WO CONTRAST Result Date: 01/25/2024 CLINICAL DATA:  Pelvic pain, chronic steroid use, lumbar compression fractures EXAM: CT PELVIS WITHOUT CONTRAST TECHNIQUE: Multidetector CT imaging of the pelvis was performed following the standard protocol without intravenous contrast. RADIATION DOSE REDUCTION: This exam was performed according to the departmental dose-optimization program which includes automated exposure control, adjustment of the mA and/or kV according to patient size and/or use of iterative reconstruction technique. COMPARISON:  None Available. FINDINGS: Urinary Tract:  No abnormality visualized. Bowel:  Unremarkable visualized pelvic bowel loops. Vascular/Lymphatic: No pathologically enlarged lymph nodes. No significant vascular abnormality seen. Reproductive: The pelvic organs are unremarkable. No free intraperitoneal fluid within the pelvis. Other:  No abdominal wall hernia Musculoskeletal: Acute left pubic symphyseal and right sacral alar compression fractures are identified in keeping with a lateral compression type 1 injury. The sacroiliac joint spaces are preserved. Pubic symphyseal joint space is preserved. No other fracture identified. No dislocation. Avascular necrosis of the left femoral head noted with mild articular collapse. Joint space appears preserved, however, mild degenerative arthritis is present with tiny osteophyte formation noted. L4 superior endplate fracture noted. IMPRESSION: 1. Acute left pubic symphyseal and right sacral alar compression fractures in keeping with a lateral compression type 1 injury. 2. L4 superior endplate fracture. 3. Avascular necrosis of the left femoral head with mild articular collapse. Electronically Signed   By: Worthy Heads M.D.   On: 01/25/2024 23:25   CT Lumbar Spine Wo Contrast Result Date: 01/25/2024 CLINICAL DATA:  Low back pain, constant or radicular pain, no prior imaging (Ped 0-17y). Chronic steroid use,  compression fracture. EXAM: CT LUMBAR SPINE WITHOUT CONTRAST TECHNIQUE: Multidetector CT imaging of the lumbar spine was performed without intravenous contrast administration. Multiplanar CT image reconstructions were also generated. RADIATION DOSE REDUCTION: This exam was performed according to the departmental dose-optimization program which includes automated exposure control, adjustment of the mA and/or kV according to patient size and/or use of iterative reconstruction technique. COMPARISON:  01/21/2024 FINDINGS: Segmentation: 5 lumbar type vertebrae. Alignment: Normal. Vertebrae: Superior endplate fracture of L1 is again seen and appears stable with proximally 10-15% loss of height and no retropulsion. This is new since MRI examination of 01/03/2024 and is acute to subacute in nature. Stable superior endplate fractures of L3 and L4 are also no 25-30% loss of height and minimal (2-3 mm) retropulsion of the posterosuperior aspect of the vertebral bodies without significant associated canal stenosis. No interval fracture since prior examination. Remaining vertebral body height is preserved. The osseous structures are diffusely osteopenic. Paraspinal and other soft tissues: Negative. Disc levels: Intervertebral disc heights are preserved. Vacuum disc phenomena noted at L2-3. No significant canal stenosis. No significant neuroforaminal narrowing. IMPRESSION: 1. Stable superior endplate fractures of L1, L3 and L4 as described above. 2. No interval fracture since prior examination. 3. No significant canal stenosis or neuroforaminal narrowing. Electronically Signed   By: Worthy Heads M.D.   On: 01/25/2024 23:08   DG Lumbar Spine Complete Result Date: 01/25/2024 CLINICAL DATA:  Compression fracture, chronic steroid use, low back pain EXAM: LUMBAR SPINE - COMPLETE 4+  VIEW COMPARISON:  04/07/2020 FINDINGS: Interval development of superior endplate fractures of L1 with minimal loss of height. Stable superior endplate  fractures of L3 and L4, acute to subacute and better assessed on prior MRI examination of 01/03/2024. No listhesis. Intervertebral disc heights are preserved. Paraspinal soft tissues are unremarkable. IMPRESSION: 1. Interval development of acute superior endplate fractures of L1 with minimal loss of height since prior MRI examination. 2. Stable superior endplate fractures L3 and L4, better assessed on MRI examination of 01/03/2024 four views are seen to be acute-subacute. Electronically Signed   By: Worthy Heads M.D.   On: 01/25/2024 21:04   CT Lumbar Spine Wo Contrast Result Date: 01/21/2024 CLINICAL DATA:  Lumbar compression fracture EXAM: CT LUMBAR SPINE WITHOUT CONTRAST TECHNIQUE: Multidetector CT imaging of the lumbar spine was performed without intravenous contrast administration. Multiplanar CT image reconstructions were also generated. RADIATION DOSE REDUCTION: This exam was performed according to the departmental dose-optimization program which includes automated exposure control, adjustment of the mA and/or kV according to patient size and/or use of iterative reconstruction technique. COMPARISON:  CT lumbar spine 12/18/2022 an MRI lumbar spine 01/03/2024 FINDINGS: Segmentation: 5 lumbar type vertebrae. Alignment: Normal. Vertebrae: There are chronic compression deformities of L3 and L4, unchanged since 01/03/2024. There is a new, mild wedge compression fracture of the L1 superior endplate with approximately 10% height loss. Paraspinal and other soft tissues: Calcific aortic atherosclerosis. Disc levels: No spinal canal stenosis. IMPRESSION: 1. New, mild wedge compression fracture of the L1 superior endplate with approximately 10% height loss. 2. Chronic compression deformities of L3 and L4, unchanged since 01/03/2024. Aortic Atherosclerosis (ICD10-I70.0). Electronically Signed   By: Juanetta Nordmann M.D.   On: 01/21/2024 01:26   CT Chest Wo Contrast Result Date: 01/21/2024 CLINICAL DATA:  Pneumonia,  complication suspected, xray [name] Pneumonia vs edema vs sarcoidosis on CXR EXAM: CT CHEST WITHOUT CONTRAST TECHNIQUE: Multidetector CT imaging of the chest was performed following the standard protocol without IV contrast. RADIATION DOSE REDUCTION: This exam was performed according to the departmental dose-optimization program which includes automated exposure control, adjustment of the mA and/or kV according to patient size and/or use of iterative reconstruction technique. COMPARISON:  Chest x-ray 01/20/2024, CT angio chest 08/05/2023, CT angio chest 09/11/2012 FINDINGS: Cardiovascular: Normal heart size. No significant pericardial effusion. The thoracic aorta is normal in caliber. Mild atherosclerotic plaque of the thoracic aorta. No coronary artery calcifications. Mitral annular calcification Mediastinum/Nodes: No gross hilar adenopathy, noting limited sensitivity for the detection of hilar adenopathy on this noncontrast study. No enlarged mediastinal or axillary lymph nodes. Thyroid  gland, trachea, and esophagus demonstrate no significant findings. Lungs/Pleura: Similar-appearing chronic bilateral upper lobe paramediastinal architectural distortion, bronchiectasis, peribronchovascular consolidation consistent with scarring. No focal consolidation. No pulmonary nodule. No pulmonary mass. No pleural effusion. No pneumothorax. Upper Abdomen: Status post cholecystectomy. Hepatic steatosis. Atrophic pancreas. Musculoskeletal: No chest wall abnormality. No suspicious lytic or blastic osseous lesions. No acute displaced fracture. Chronic T11 superior endplate compression fracture. IMPRESSION: 1. No acute intrathoracic abnormality. 2. Similar-appearing chronic bilateral upper lobe paramediastinal architectural distortion, bronchiectasis, peribronchovascular consolidation consistent with scarring. 3. Hepatic steatosis. 4. Aortic Atherosclerosis (ICD10-I70.0) including mitral annular calcification. Electronically Signed    By: Morgane  Naveau M.D.   On: 01/21/2024 00:44   DG Chest Port 1 View Result Date: 01/20/2024 CLINICAL DATA:  Chest pain and difficulty breathing. EXAM: PORTABLE CHEST 1 VIEW COMPARISON:  Jan 03, 2024 FINDINGS: The cardiac silhouette is enlarged and unchanged in size. There is moderate to marked  severity prominence of the pulmonary vasculature, increased in severity when compared to the prior study. Moderate to marked severity diffusely increased interstitial lung markings are also seen. There is moderate to marked severity airspace disease seen within the periphery of the mid to lower left lung. This is limited in evaluation secondary to overlying breast attenuation artifact. Biapical pleural thickening is noted. A small to moderate size left pleural effusion is also suspected. No pneumothorax is identified. Multilevel degenerative changes seen throughout the thoracic spine. IMPRESSION: 1. Cardiomegaly with moderate to marked severity pulmonary vascular congestion and interstitial edema. 2. Moderate to marked severity airspace disease within the periphery of the mid to lower left lung, which may represent asymmetric pulmonary edema versus pneumonia. Further evaluation with nonemergent chest CT is recommended to further exclude the presence of an underlying neoplastic process. 3. Small to moderate size left pleural effusion. Electronically Signed   By: Virgle Grime M.D.   On: 01/20/2024 23:38   MR LUMBAR SPINE WO CONTRAST Result Date: 01/03/2024 CLINICAL DATA:  Initial evaluation for acute low back pain, injury earlier today. EXAM: MRI LUMBAR SPINE WITHOUT CONTRAST TECHNIQUE: Multiplanar, multisequence MR imaging of the lumbar spine was performed. No intravenous contrast was administered. COMPARISON:  Prior study from 12/07/2023 FINDINGS: Segmentation: Transitional lumbosacral anatomy with partial sacralization of the L5 vertebral body on the left. Same numbering system employed as on previous exam.  Alignment: Vertebral bodies normally aligned with preservation of the normal cervical lordosis. No significant interval listhesis or malalignment. Vertebrae: Previously identified acute to subacute compression fracture involving the superior endplate of L4 again seen. Associated height loss is relatively stable from prior persistent marrow edema without significant progressive height loss since prior. Associated 3 mm bony retropulsion, similar. There is a new acute compression fracture involving the superior endplate of L3. Associated height loss measures up to 30% centrally with no more than trace 2 mm retropulsion. This is benign/mechanical in appearance. Vertebral body height otherwise maintained. Bone marrow signal intensity within normal limits. No worrisome osseous lesions. Mild reactive edema about the S2 and S3 segments without fracture deformity, stable. Conus medullaris and cauda equina: Conus extends to the T12-L1 level. Conus and cauda equina appear normal. Paraspinal and other soft tissues: Paraspinous soft tissues demonstrate no acute finding. Subcentimeter T2 hyperintense cyst at the lower pole of the left kidney, benign in appearance, no follow-up imaging recommended. Disc levels: T11-12: Seen only on sagittal projection. Disc desiccation with small central disc protrusion. No stenosis. T12-L1: Unremarkable. L1-2: Unremarkable. L2-3: Acute compression fracture involving the superior endplate of L3 with no more than trace 2 mm bony retropulsion. Mild facet hypertrophy. No significant spinal stenosis. Foramina remain patent. L3-4: Disc desiccation without significant disc bulge. 3 mm bony retropulsion related to the acute to subacute L4 compression fracture. Mild bilateral facet hypertrophy. Resultant mild narrowing of the lateral recesses bilaterally. Central canal remains patent. No significant foraminal stenosis. L4-5: Disc desiccation with minimal annular bulge. Mild facet hypertrophy. Epidural  lipomatosis. No significant canal or foraminal stenosis. L5-S1: Normal interspace. Epidural lipomatosis. No canal or foraminal stenosis. IMPRESSION: 1. Acute compression fracture involving the superior endplate of L3 with up to 30% height loss and trace 2 mm bony retropulsion. No significant stenosis. This is new as compared to recent MRI from 12/07/2023. 2. Acute to subacute compression fracture involving the superior endplate of L4 with associated 3 mm bony retropulsion, not significantly changed from prior. 3. Underlying mild for age degenerative spondylosis without significant stenosis or neural impingement.  Electronically Signed   By: Virgia Griffins M.D.   On: 01/03/2024 20:06   DG Chest Port 1 View Result Date: 01/03/2024 CLINICAL DATA:  Patient presents after "twisting her back today". EXAM: PORTABLE CHEST 1 VIEW COMPARISON:  August 05, 2023 FINDINGS: Limited study secondary to overlying breast attenuation artifact. The cardiac silhouette is limited in evaluation but appears to be stable. There is mild to moderate severity prominence of the central pulmonary vasculature with mild diffusely increased interstitial lung markings. No pleural effusion or pneumothorax is identified. The visualized skeletal structures are unremarkable. IMPRESSION: 1. Limited study secondary to overlying breast attenuation artifact. 2. Pulmonary vascular congestion. A component of superimposed interstitial edema cannot be excluded. Electronically Signed   By: Virgle Grime M.D.   On: 01/03/2024 19:20    Labs:  CBC: Recent Labs    01/20/24 2255 01/25/24 2128 01/26/24 0523 01/27/24 0436  WBC 7.3 9.1 9.0 9.7  HGB 9.1* 9.1* 8.8* 9.1*  HCT 30.3* 30.2* 29.4* 31.0*  PLT 407* 415* 423* 388    COAGS: No results for input(s): "INR", "APTT" in the last 8760 hours.  BMP: Recent Labs    01/22/24 0431 01/24/24 1419 01/25/24 2128 01/26/24 0523 01/27/24 0436  NA 143 140 141 141 141  K 3.9 4.9 3.9 3.7 3.9   CL 100 94* 98 100 103  CO2 37* 30* 34* 33* 29  GLUCOSE 116* 145* 128* 102* 117*  BUN 14 8 8 11 8   CALCIUM  9.1 9.3 8.9 8.8* 8.8*  CREATININE 0.79 0.77 0.80 0.72 0.81  GFRNONAA >60  --  >60 >60 >60    LIVER FUNCTION TESTS: Recent Labs    10/25/23 1227 01/24/24 1419 01/25/24 2128 01/26/24 0523  BILITOT <0.2 0.2 0.5 0.4  AST 15 20 18 17   ALT 31 23 25 23   ALKPHOS 80 129* 96 92  PROT 6.3 5.8* 5.4* 5.5*  ALBUMIN 4.3 3.8* 2.9* 2.8*      Assessment and Plan:  63 y.o. female inpatient. History of morbid obesity,chronic hypoxic respiratory failure (on 5L via Baker baseline), interstitial lung disease, biopsy proved sarcoidosis (on steroids), Addison's disease, osteoporosis, bilateral hip AVN, known left femoral head avascular necrosis ( not a surgical candidate) and chronic urinary incontinence.  Recently admitted for the following:  L4 fracture on 4.12.25 through 4.16.25  L3 fracture from 5.9.25 through 5.14.25  L1 fracture on 5.26.25 through 5.29.25 Presented to the ED at [facility] on 5.31.25 with back pain. CT Lumbar Spine from 5.31.25  reads  Superior endplate fracture of L1 is again seen and appears stable with proximally 10-15% loss of height and no retropulsion. This is new since MRI examination of 01/03/2024 and is acute to subacute in nature.Stable superior endplate fractures of L3 and L4 are also no 25-30% loss of height and minimal (2-3 mm) retropulsion of the posterosuperior aspect of the vertebral bodies without significant associated canal stenosis. Per CT Pelvis from 5.31.25 the patient was also found to have a sacral and left pubic symphyseal fracture. CT reads  Acute left pubic symphyseal and right sacral alar compression fractures in keeping with a lateral compression type 1 injury.   PLAN: IR Image Guided L3/L4 Kyphoplasty.Tentatively approved based upon involvement  from general anesthesia.   IR consulted for possible L3/L4 Kyphoplasty., Case has been reviewed and procedure  approved by Dr. Alvira Josephs.  Team instructed to: Transfer patient to [facility] where a formal evaluation from anesthesia can be performed.   Patient and her daughter Kathleen [name] understand the  should anesthesia not approve the case the procedure cannot be performed.    Risks and benefits of L3/L4 Kyphoplasty/vertebroplasty were discussed with the patient including, but not limited to education regarding the natural healing process of compression fractures without intervention, bleeding, infection, cement migration which may cause spinal cord damage, paralysis, pulmonary embolism or even death. This interventional procedure involves the use of X-rays and because of the nature of the planned procedure, it is possible that we will have prolonged use of X-ray fluoroscopy. Potential radiation risks to you include (but are not limited to) the following: - A slightly elevated risk for cancer  several years later in life. This risk is typically less than 0.5% percent. This risk is low in comparison to the normal incidence of human cancer, which is 33% for women and 50% for men according to the [employer] Society. - Radiation induced injury can include skin redness, resembling a rash, tissue breakdown / ulcers and hair loss (which can be temporary or permanent).  The likelihood of either of these occurring depends on the difficulty of the procedure and whether you are sensitive to radiation due to previous procedures, disease, or genetic conditions.  IF your procedure requires a prolonged use of radiation, you will be notified and given written instructions for further action.  It is your responsibility to monitor the irradiated area for the 2 weeks following the procedure and to notify your physician if you are concerned that you have suffered a radiation induced injury.   All of the patient's and her daughter questions were answered, patient are her daughter are both agreeable to proceed.  Consent signed  and in chart.   Thank you for this interesting consult.  I greatly enjoyed meeting Kathleen [name] and look forward to participating in their care.  A copy of this report was sent to the requesting provider on this date.  Electronically Signed: Marceil Sensor, NP 01/27/2024, 2:45 PM   I spent a total of 40 Minutes    in face to face in clinical consultation, greater than 50% of which was counseling/coordinating care for L3/L4 Kyphoplasty

## 2024-01-27 NOTE — Evaluation (Signed)
 Physical Therapy Evaluation Patient Details Name: Kathleen [name] MRN: [id number] DOB: Dec 27, 1960 Today's Date: 01/27/2024  History of Present Illness  Kathleen [name] is a 63 y.o. female with a history of chronic hypoxic respiratory failure (5L O2), ILD, sarcoidosis, Addison's disease, morbid obesity, bilateral hip AVN, osteoporosis with multiple vertebral fractures on chronic narcotics, and recent admissions for intractable pain due to compression fractures of L4 (4/12-4/16) and then L3 (5/9-5/14), readmitted on 01/20/2024 with acute L1 fracture, discharged home on 01/23/2024     A/p  1)Intractable pain /Chronic pain syndrome  Acute left pubic symphyseal and left sacral alar fracture  Previous superior endplate fractures at L1-L3 and L4  Left femoral head avascular necrosis-not surgical candidate   Clinical Impression  Patient required HOB full raised for sitting up at bedside, c/o severe back, sacral pain upon sitting, then able to take a few side steps to transfer to chair using RW.  Patient tolerated sitting up in chair with legs raised after therapy. Patient will benefit from continued skilled physical therapy in hospital and recommended venue below to increase strength, balance, endurance for safe ADLs and gait.       If plan is discharge home, recommend the following: A lot of help with walking and/or transfers;A lot of help with bathing/dressing/bathroom;Help with stairs or ramp for entrance;Assistance with cooking/housework   Can travel by private vehicle   No    Equipment Recommendations None recommended by PT  Recommendations for Other Services       Functional Status Assessment Patient has had a recent decline in their functional status and demonstrates the ability to make significant improvements in function in a reasonable and predictable amount of time.     Precautions / Restrictions Precautions Precautions: Fall Recall of Precautions/Restrictions:  Intact Precaution/Restrictions Comments: LSO brace Restrictions Weight Bearing Restrictions Per Provider Order: No      Mobility  Bed Mobility Overal bed mobility: Needs Assistance Bed Mobility: Supine to Sit     Supine to sit: HOB elevated, Min assist     General bed mobility comments: required HOB fully raised and use of bed rail    Transfers Overall transfer level: Needs assistance Equipment used: Rolling walker (2 wheels) Transfers: Sit to/from Stand, Bed to chair/wheelchair/BSC Sit to Stand: Min assist   Step pivot transfers: Min assist       General transfer comment: unsteady labored movement with c/o severe back, sacral pain    Ambulation/Gait Ambulation/Gait assistance: Min assist, Mod assist Gait Distance (Feet): 6 Feet Assistive device: Rolling walker (2 wheels) Gait Pattern/deviations: Decreased step length - left, Decreased stance time - right, Decreased stride length, Step-to pattern, Wide base of support Gait velocity: slow     General Gait Details: able to take a few side steps at bedside before having to sit due to back, sacral pain  Stairs            Wheelchair Mobility     Tilt Bed    Modified Rankin (Stroke Patients Only)       Balance Overall balance assessment: Needs assistance Sitting-balance support: Feet supported, No upper extremity supported Sitting balance-Leahy Scale: Fair Sitting balance - Comments: fair/good seated at EOB   Standing balance support: Reliant on assistive device for balance, During functional activity, Bilateral upper extremity supported Standing balance-Leahy Scale: Fair Standing balance comment: using RW  Pertinent Vitals/Pain Pain Assessment Pain Assessment: Faces Faces Pain Scale: Hurts even more Pain Location: low back, sacral area, groin area Pain Descriptors / Indicators: Guarding, Grimacing, Moaning, Sharp, Sore Pain Intervention(s): Limited activity  within patient's tolerance, Monitored during session, Premedicated before session, Repositioned    Home Living Family/patient expects to be discharged to:: Private residence Living Arrangements: Spouse/significant other Available Help at Discharge: Family;Available PRN/intermittently Type of Home: House Home Access: Stairs to enter Entrance Stairs-Rails: None Entrance Stairs-Number of Steps: 1   Home Layout: One level Home Equipment: Agricultural consultant (2 wheels);Electric scooter;Shower seat;Hand held shower head;BSC/3in1      Prior Function Prior Level of Function : Needs assist       Physical Assist : Mobility (physical);ADLs (physical) Mobility (physical): Bed mobility;Transfers;Gait;Stairs ADLs (physical): Dressing;Bathing;Toileting;IADLs Mobility Comments: short household distances using RW, scooter ADLs Comments: Husband assist with most ADL's pt able to feed self and comb hair.     Extremity/Trunk Assessment   Upper Extremity Assessment Upper Extremity Assessment: Defer to OT evaluation    Lower Extremity Assessment Lower Extremity Assessment: Generalized weakness    Cervical / Trunk Assessment Cervical / Trunk Assessment: Normal  Communication   Communication Communication: No apparent difficulties    Cognition Arousal: Alert Behavior During Therapy: WFL for tasks assessed/performed   PT - Cognitive impairments: No apparent impairments                         Following commands: Intact       Cueing Cueing Techniques: Verbal cues, Tactile cues     General Comments      Exercises     Assessment/Plan    PT Assessment Patient needs continued PT services  PT Problem List Decreased strength;Decreased activity tolerance;Decreased balance;Decreased mobility       PT Treatment Interventions DME instruction;Gait training;Stair training;Functional mobility training;Therapeutic activities;Therapeutic exercise;Balance training;Patient/family  education    PT Goals (Current goals can be found in the Care Plan section)  Acute Rehab PT Goals Patient Stated Goal: return home with family to assist PT Goal Formulation: With patient Time For Goal Achievement: 01/31/24 Potential to Achieve Goals: Good    Frequency       Co-evaluation               AM-PAC PT "6 Clicks" Mobility  Outcome Measure Help needed turning from your back to your side while in a flat bed without using bedrails?: A Little Help needed moving from lying on your back to sitting on the side of a flat bed without using bedrails?: A Lot Help needed moving to and from a bed to a chair (including a wheelchair)?: A Little Help needed standing up from a chair using your arms (e.g., wheelchair or bedside chair)?: A Little Help needed to walk in hospital room?: A Lot Help needed climbing 3-5 steps with a railing? : A Lot 6 Click Score: 15    End of Session Equipment Utilized During Treatment: Oxygen  Activity Tolerance: Patient tolerated treatment well;Patient limited by fatigue;Patient limited by pain Patient left: in chair;with call bell/phone within reach Nurse Communication: Mobility status PT Visit Diagnosis: Unsteadiness on feet (R26.81);Other abnormalities of gait and mobility (R26.89);Muscle weakness (generalized) (M62.81)    Time: 7829-5621 PT Time Calculation (min) (ACUTE ONLY): 12 min   Charges:   PT Evaluation $PT Eval Low Complexity: 1 Low PT Treatments $Therapeutic Activity: 8-22 mins PT General Charges $$ ACUTE PT VISIT: 1 Visit  3:06 PM, 01/27/24 Walton Guppy, MPT Physical Therapist with [facility] [phone] office [phone] mobile phone

## 2024-01-27 NOTE — Progress Notes (Signed)
 Transport arrived to transfer patient. Pt belongings given to husband who is at the bedside. Pt c/o 20/10 pain and was given 1mg  hydromorphone  prior to transport.

## 2024-01-27 NOTE — Progress Notes (Signed)
 Bariatric bed brought to room, bed would not inflate. Portable equipment called after several nurses attempted to correct the problem. New bed ordered and to be delivered this evening. Patient up to recliner waiting for bed to inflate a couple of hours d/t problem with bed. Old bed brought back to the room and patient transferred herself from the recliner to the bed. Yelling out while moving. Pain meds admin prior to transfer.

## 2024-01-27 NOTE — Progress Notes (Signed)
 Report called to Lauren RN at [facility]. Paperwork in envelope ready for transfer when EMS arrives.

## 2024-01-27 NOTE — Progress Notes (Addendum)
 Initial Nutrition Assessment  DOCUMENTATION CODES:   Obesity unspecified  INTERVENTION:   -Continue regular diet -MVI with minerals daily -Ensure Plus High Protein po BID, each supplement provides 350 kcal and 20 grams of protein  NUTRITION DIAGNOSIS:   Increased nutrient needs related to acute illness as evidenced by estimated needs.  GOAL:   Patient will meet greater than or equal to 90% of their needs  MONITOR:   PO intake, Supplement acceptance  REASON FOR ASSESSMENT:   Consult Assessment of nutrition requirement/status  ASSESSMENT:   Kathleen [name] with medical history significant of complex medical history of the following: Adrenal insufficiency on chronic Cortef , avascular necrosis of left femoral head nonrepairable due to underlying severe pulmonary disease, restrictive lung disease secondary to sarcoidosis on chronic prednisone , chronic hypoxemic respiratory failure, GERD, hypothyroidism, mitral valve prolapse, chronic orthostatic hypotension on midodrine , chronic pain on narcotics followed by the [facility] pain clinic, vitamin D  deficiency anemia and chronic disease. Admitted secondary to lumbar pain.  Kathleen [name] admitted with intractable pain, chronic pain syndrome, acute lt pubic symphyseal and lt sacral alar fractures, previous superior endplate fractures (L1-L3 and L4), and lt femoral head avascular necrosis.   5/31- CT of lumbar spine revealed Stable superior endplate fractures of L1, L3 and L4; pelvic CT revealed Acute left pubic symphyseal and right sacral alar compression fractures in keeping with a lateral compression type 1 injury, L4 superior endplate fracture, Avascular necrosis of the left femoral head with mild articular collapse  Reviewed I/O's: +93 ml x 24 hours and +193 ml since admission  Per orthopedics notes, pain is related to compression fracture and sacral alar fracture; no reason to transfer to higher level of care at this time.   IR consulted for  possible kyphoplasty. Kathleen [name] consult has been ordered.   Kathleen [name] currently on a regular diet. No meal completion data available to assess at this time.   No wt loss noted over the past 6 months. Per nursing assessment, Kathleen [name] with mild edema, which may be masking true weight loss as well as fat and muscle depletions.   Obesity is a complex, chronic medical condition that is optimally managed by a multidisciplinary care team. Weight loss is not an ideal goal for an acute inpatient hospitalization. However, if further work-up for obesity is warranted, consider outpatient referral to [place].    Medications reviewed and include pulmicort , buspar , lovenox , ferrous sulfate , lasix , protonix , miralax , predisone, senokot, fleet enema, rocpehin, and vitamin D .   Labs reviewed: CBGS: 157 (inpatient orders for glycemic control are none).    Diet Order:   Diet Order             Diet regular Room service appropriate? Yes; Fluid consistency: Thin  Diet effective now                   EDUCATION NEEDS:   No education needs have been identified at this time  Skin:  Skin Assessment: Reviewed RN Assessment  Last BM:  01/25/24  Height:   Ht Readings from Last 1 Encounters:  01/25/24 5\' 10"  (1.778 m)    Weight:   Wt Readings from Last 1 Encounters:  01/25/24 125.6 kg    Ideal Body Weight:  72.7 kg  BMI:  Body mass index is 39.75 kg/m.  Estimated Nutritional Needs:   Kcal:  2000-2200  Protein:  100-115 grams  Fluid:  2.0-2.2 L    Herschel Lords, RD, LDN, CDCES Registered Dietitian III  Certified Diabetes Care and Education Specialist If unable to reach this RD, please use "RD Inpatient" group chat on secure chat between hours of 8am-4 pm daily

## 2024-01-27 NOTE — Plan of Care (Signed)
  Problem: Acute Rehab PT Goals(only PT should resolve) Goal: Pt Will Go Supine/Side To Sit Outcome: Progressing Flowsheets (Taken 01/27/2024 1508) Pt will go Supine/Side to Sit: with contact guard assist Goal: Patient Will Transfer Sit To/From Stand Outcome: Progressing Flowsheets (Taken 01/27/2024 1508) Patient will transfer sit to/from stand: with contact guard assist Goal: Pt Will Transfer Bed To Chair/Chair To Bed Outcome: Progressing Flowsheets (Taken 01/27/2024 1508) Pt will Transfer Bed to Chair/Chair to Bed: with contact guard assist Goal: Pt Will Ambulate Outcome: Progressing Flowsheets (Taken 01/27/2024 1508) Pt will Ambulate:  25 feet  with contact guard assist  with minimal assist  with rolling walker   3:08 PM, 01/27/24 Walton Guppy, MPT Physical Therapist with [facility] 336 [phone] office [phone] mobile phone

## 2024-01-27 NOTE — Progress Notes (Addendum)
 PROGRESS NOTE  Kathleen [name], is a 63 y.o. female, DOB - [birth year], NWG:[id number]  Admit date - 01/25/2024   Admitting Physician Twilla Galea, DO  Outpatient Primary MD for the patient is Luking, Jackelyn Marvel, MD  LOS - 1  Chief Complaint  Patient presents with   Back Pain      Brief Summary Kathleen [name] is a 63 y.o. female with a history of chronic hypoxic respiratory failure (5L O2), ILD, sarcoidosis, Addison's disease, morbid obesity, hypothyroidism, chronic pain syndrome, chronic anemia, depression and anxiety, bilateral hip AVN, osteoporosis with multiple vertebral fractures on chronic narcotics, and recent admissions for intractable pain due to compression fractures of L4 (4/12-4/16) and then L3 (5/9-5/14), readmitted on 01/20/2024 with acute L1 fracture, discharged home on 01/23/2024, re-admitted on 01/26/24 with worsening lumbar and pelvic pain and found to have Acute left pubic symphyseal and Rt sacral alar fracture  - EDP discussed case with patient's orthospine specialist Dr. [name] who recommended against transfer to [facility] at this time. ---Transfer  to [facility] for possible kyphoplasty pending anesthesia clearance   -Assessment and Plan: 1)Intractable Acute pain superimposed on chronic pain syndrome due to Acute left pubic symphyseal and Right sacral alar fracture and Recent  superior endplate fractures at L1-L3 and L4 - Continue pain control -Physical therapy eval - Initially the plan was for outpatient follow-up with Dr. [name] for possible kyphoplasty after pulmonology clearance from Dr. Pierce Brewster [facility])  -After further conversation with IR team, NIR Attending Dr. Alvira Josephs has approved a L3/L4 kyphoplasty pending clearance from anesthesia team at [facility] --Patient will be transferred to [facility] for possible kyphoplasty pending clearance from anesthesia and will discuss - Pain control with IV and oral opiates - Cyclobenzaprine  as ordered  2)Acute compression  fracture L1  Recent L3/4 compression fractures  Essentially recurrent episodes of atraumatic fractures reports moving legs and hearing pop in her lower back with acute worsening of pain. - Has TLSO brace but per recent documentation no longer fits patient.  - Strongly advised patient to contact orthospine clinic for refitting.  Recommend TLSO when up out of bed   3)H/o Osteopenia/Vit D Deficiency-----Now clinically consistent with osteoporosis with none traumatic fractures in the setting of chronic prednisone  use -Bone density test from 02/23/2020 showed T-score of -2.3 (osteopenic range) --Patient with recurrent episodes of atraumatic fractures suspect osteoporosis -Previously was on Miacalcin  and continues to have fractures -Ideally we should Stop Miacalcin  (calcitonin), and start Fosamax  instead with precautions (alendronate  (Fosamax ) is a more potent antiresorptive agent with superior efficacy in increasing BMD and reducing both vertebral and nonvertebral fractures compared to calcitonin, and is preferred as first-line therapy for osteoporosis. ) --Unfortunately patient is not able or not willing to sit upright as required for extended period of time after oral Fosamax ----risk of significant esophagitis if she refuses to be upright for extended period of time after Fosamax  may preclude us  from using Fosamax  -c/n  Calcium  and Vitamin D   - Follow-up as outpatient with Dr. Nida Gebreselassie for repeat bone density test and to discuss possible treatment osteoporosis including Prolia --replace Vit D follow-up with Dr. Nida Gebreselassie as outpatient   4)Possible UTI--continue IV Rocephin  pending culture data  5)Left femoral head avascular necrosis-not surgical candidate -Supportive care   6)Morbid Obesity- -- Recommend weight loss to help offload her back, consider GLP-1, bariatric surgery referral. -Body mass index is 39.75 kg/m. -Low calorie diet, portion control and increase physical  activity discussed with patient  Chronic  medical problems: Sarcoidosis, ILD, CHRF on 5 L O2: Follows with outpatient pulmonology-Dr Jolyne Needs at [facility]. Continue home prednisone  5 mg for sarcoidosis/ILD, incentive spirometry, nebs as needed Addison's disease: Continue home hydrocortisone   Chronic hypotension: C/n Home midodrine  5 mg twice daily and hydrocortisone  Hyperlipidemia: Continue home rosuvastatin  Hypothyroidism: Continue home levothyroxine  AVN hips: Noted, not a surgical candidate Urinary retention: Continue on tamsulosin  Chronic anemia: Stable around 9, defer additional evaluation to outpatient. Acute on Chronic pain syndrome: See acute pain management above Anxiety and depression: Continue home buspirone , cymbalta , prn atarax   Status is: Inpatient   Disposition: The patient is from: Home              Anticipated d/c is to: Home              Anticipated d/c date is: 2 days              Patient currently is not medically stable to d/c. Barriers: Not Clinically Stable-   Code Status :  -  Code Status: Full Code   Family Communication:    NA (patient is alert, awake and coherent)  Husband and her daughter are primary contacts -- I called and Updated pt's daughter Ms Belma Boxer, questions answered on 01/27/24  DVT Prophylaxis  :   - SCDs   SCDs Start: 01/26/24 0425   Lab Results  Component Value Date   PLT 388 01/27/2024    Inpatient Medications  Scheduled Meds:  [START ON 01/28/2024] alendronate   70 mg Oral QAC breakfast   budesonide   0.5 mg Nebulization BID   busPIRone   15 mg Oral TID   DULoxetine   60 mg Oral Daily   enoxaparin  (LOVENOX ) injection  60 mg Subcutaneous Q24H   feeding supplement  237 mL Oral BID BM   ferrous sulfate   325 mg Oral Weekly   furosemide   20 mg Oral Q M,W,F   hydrocortisone   10 mg Oral Q lunch   hydrocortisone   20 mg Oral Daily   ipratropium-albuterol   3 mL Nebulization BID   levothyroxine   50 mcg Oral QAC breakfast   lidocaine   1  patch Transdermal Q24H   linaclotide   72 mcg Oral QAC breakfast   midodrine   5 mg Oral BID WC   multivitamin with minerals  1 tablet Oral Daily   pantoprazole   40 mg Oral Daily   polyethylene glycol  17 g Oral BID   predniSONE   5 mg Oral Q breakfast   pregabalin   25 mg Oral TID   prochlorperazine   10 mg Intravenous Once   rosuvastatin   20 mg Oral Daily   senna-docusate  2 tablet Oral QHS   sodium chloride  flush  3 mL Intravenous Q12H   sodium phosphate   1 enema Rectal Once   tamsulosin   0.4 mg Oral QPC supper   Vitamin D  (Ergocalciferol )  50,000 Units Oral Q Sun   Continuous Infusions:  cefTRIAXone  (ROCEPHIN )  IV Stopped (01/26/24 2210)   PRN Meds:.acetaminophen  **OR** acetaminophen , albuterol , cyclobenzaprine , HYDROmorphone  (DILAUDID ) injection, hydrOXYzine , lactulose , ondansetron  **OR** ondansetron  (ZOFRAN ) IV, oxyCODONE    Anti-infectives (From admission, onward)    Start     Dose/Rate Route Frequency Ordered Stop   01/26/24 2200  cefTRIAXone  (ROCEPHIN ) 1 g in sodium chloride  0.9 % 100 mL IVPB        1 g 200 mL/hr over 30 Minutes Intravenous Every 24 hours 01/26/24 1109     01/26/24 0145  cefTRIAXone  (ROCEPHIN ) 1 g in sodium chloride  0.9 % 100 mL IVPB  1 g 200 mL/hr over 30 Minutes Intravenous  Once 01/26/24 0139 01/26/24 0235      Subjective: Ada Holler today has no fevers, no emesis,  No chest pain,   -- Pain control remains challenging -Transfer  to [facility] for possible kyphoplasty pending anesthesia clearance - I called and Updated pt's daughter Ms Belma Boxer, questions answered -- Objective: Vitals:   01/26/24 1923 01/26/24 2104 01/27/24 0331 01/27/24 0714  BP: 106/61  115/72   Pulse: (!) 101  (!) 106   Resp: 20  20   Temp: 97.9 F (36.6 C)  98.1 F (36.7 C)   TempSrc: Oral  Oral   SpO2: 96% 95% 95% 99%  Weight:      Height:        Intake/Output Summary (Last 24 hours) at 01/27/2024 1237 Last data filed at 01/27/2024 0300 Gross per 24 hour   Intake 92.93 ml  Output --  Net 92.93 ml   Filed Weights   01/25/24 1908  Weight: 125.6 kg    Physical Exam  Gen:- Awake Alert, morbidly obese HEENT:- [place].AT, No sclera icterus Nose- [place] 5L/min Neck-Supple Neck,No JVD,.  Lungs-no wheezing or rhonchi, fair symmetrical air movement CV- S1, S2 normal, regular  Abd-  +ve B.Sounds, Abd Soft, No tenderness, increased truncal adiposity Extremity/Skin:- No  edema, pedal pulses present  Psych-affect is appropriate, oriented x3 Neuro-generalized weakness, no new focal deficits, no tremors MSK--pain with positional change and range of motion over the left hip and pelvic area , much improved point tenderness over the spinous processes of the lumbar area, no step-off deformity over the spinous processes, no obvious deformity, overlying skin without erythema warmth or streaking   Data Reviewed: I have personally reviewed following labs and imaging studies  CBC: Recent Labs  Lab 01/20/24 2255 01/25/24 2128 01/26/24 0523 01/27/24 0436  WBC 7.3 9.1 9.0 9.7  NEUTROABS 4.6 6.8  --   --   HGB 9.1* 9.1* 8.8* 9.1*  HCT 30.3* 30.2* 29.4* 31.0*  MCV 97.1 98.1 98.0 99.0  PLT 407* 415* 423* 388   Basic Metabolic Panel: Recent Labs  Lab 01/22/24 0431 01/24/24 1419 01/25/24 2128 01/26/24 0523 01/27/24 0436  NA 143 140 141 141 141  K 3.9 4.9 3.9 3.7 3.9  CL 100 94* 98 100 103  CO2 37* 30* 34* 33* 29  GLUCOSE 116* 145* 128* 102* 117*  BUN 14 8 8 11 8   CREATININE 0.79 0.77 0.80 0.72 0.81  CALCIUM  9.1 9.3 8.9 8.8* 8.8*  MG 2.3  --   --  2.2  --   PHOS 4.5  --   --  3.9  --    GFR: Estimated Creatinine Clearance: 105.1 mL/min (by C-G formula based on SCr of 0.81 mg/dL). Liver Function Tests: Recent Labs  Lab 01/24/24 1419 01/25/24 2128 01/26/24 0523  AST 20 18 17   ALT 23 25 23   ALKPHOS 129* 96 92  BILITOT 0.2 0.5 0.4  PROT 5.8* 5.4* 5.5*  ALBUMIN 3.8* 2.9* 2.8*   Recent Results (from the past 240 hours)  Urine Culture      Status: Abnormal (Preliminary result)   Collection Time: 01/25/24 11:08 PM   Specimen: Urine, Clean Catch  Result Value Ref Range Status   Specimen Description   Final    URINE, CLEAN CATCH Performed at [facility], [address], [place], [place] [address]    Special Requests   Final    NONE Performed at [facility], [address], [place], [place] [address]    Culture >=100,000 COLONIES/mL ESCHERICHIA COLI (A)  Final   Report Status PENDING  Incomplete    Radiology Studies: CT PELVIS WO CONTRAST Result Date: 01/25/2024 CLINICAL DATA:  Pelvic pain, chronic steroid use, lumbar compression fractures EXAM: CT PELVIS WITHOUT CONTRAST TECHNIQUE: Multidetector CT imaging of the pelvis was performed following the standard protocol without intravenous contrast. RADIATION DOSE REDUCTION: This exam was performed according to the departmental dose-optimization program which includes automated exposure control, adjustment of the mA and/or kV according to patient size and/or use of iterative reconstruction technique. COMPARISON:  None Available. FINDINGS: Urinary Tract:  No abnormality visualized. Bowel:  Unremarkable visualized pelvic bowel loops. Vascular/Lymphatic: No pathologically enlarged lymph nodes. No significant vascular abnormality seen. Reproductive: The pelvic organs are unremarkable. No free intraperitoneal fluid within the pelvis. Other:  No abdominal wall hernia Musculoskeletal: Acute left pubic symphyseal and right sacral alar compression fractures are identified in keeping with a lateral compression type 1 injury. The sacroiliac joint spaces are preserved. Pubic symphyseal joint space is preserved. No other fracture identified. No dislocation. Avascular necrosis of the left femoral head noted with mild articular collapse. Joint space appears preserved, however, mild degenerative arthritis is present with tiny osteophyte formation noted. L4 superior endplate fracture noted. IMPRESSION: 1.  Acute left pubic symphyseal and right sacral alar compression fractures in keeping with a lateral compression type 1 injury. 2. L4 superior endplate fracture. 3. Avascular necrosis of the left femoral head with mild articular collapse. Electronically Signed   By: Worthy Heads M.D.   On: 01/25/2024 23:25   CT Lumbar Spine Wo Contrast Result Date: 01/25/2024 CLINICAL DATA:  Low back pain, constant or radicular pain, no prior imaging (Ped 0-17y). Chronic steroid use, compression fracture. EXAM: CT LUMBAR SPINE WITHOUT CONTRAST TECHNIQUE: Multidetector CT imaging of the lumbar spine was performed without intravenous contrast administration. Multiplanar CT image reconstructions were also generated. RADIATION DOSE REDUCTION: This exam was performed according to the departmental dose-optimization program which includes automated exposure control, adjustment of the mA and/or kV according to patient size and/or use of iterative reconstruction technique. COMPARISON:  01/21/2024 FINDINGS: Segmentation: 5 lumbar type vertebrae. Alignment: Normal. Vertebrae: Superior endplate fracture of L1 is again seen and appears stable with proximally 10-15% loss of height and no retropulsion. This is new since MRI examination of 01/03/2024 and is acute to subacute in nature. Stable superior endplate fractures of L3 and L4 are also no 25-30% loss of height and minimal (2-3 mm) retropulsion of the posterosuperior aspect of the vertebral bodies without significant associated canal stenosis. No interval fracture since prior examination. Remaining vertebral body height is preserved. The osseous structures are diffusely osteopenic. Paraspinal and other soft tissues: Negative. Disc levels: Intervertebral disc heights are preserved. Vacuum disc phenomena noted at L2-3. No significant canal stenosis. No significant neuroforaminal narrowing. IMPRESSION: 1. Stable superior endplate fractures of L1, L3 and L4 as described above. 2. No interval  fracture since prior examination. 3. No significant canal stenosis or neuroforaminal narrowing. Electronically Signed   By: Worthy Heads M.D.   On: 01/25/2024 23:08   DG Lumbar Spine Complete Result Date: 01/25/2024 CLINICAL DATA:  Compression fracture, chronic steroid use, low back pain EXAM: LUMBAR SPINE - COMPLETE 4+ VIEW COMPARISON:  04/07/2020 FINDINGS: Interval development of superior endplate fractures of L1 with minimal loss of height. Stable superior endplate fractures of L3 and L4, acute to subacute and better assessed on prior MRI examination of 01/03/2024. No listhesis.  Intervertebral disc heights are preserved. Paraspinal soft tissues are unremarkable. IMPRESSION: 1. Interval development of acute superior endplate fractures of L1 with minimal loss of height since prior MRI examination. 2. Stable superior endplate fractures L3 and L4, better assessed on MRI examination of 01/03/2024 four views are seen to be acute-subacute. Electronically Signed   By: Worthy Heads M.D.   On: 01/25/2024 21:04   Scheduled Meds:  [START ON 01/28/2024] alendronate   70 mg Oral QAC breakfast   budesonide   0.5 mg Nebulization BID   busPIRone   15 mg Oral TID   DULoxetine   60 mg Oral Daily   enoxaparin  (LOVENOX ) injection  60 mg Subcutaneous Q24H   feeding supplement  237 mL Oral BID BM   ferrous sulfate   325 mg Oral Weekly   furosemide   20 mg Oral Q M,W,F   hydrocortisone   10 mg Oral Q lunch   hydrocortisone   20 mg Oral Daily   ipratropium-albuterol   3 mL Nebulization BID   levothyroxine   50 mcg Oral QAC breakfast   lidocaine   1 patch Transdermal Q24H   linaclotide   72 mcg Oral QAC breakfast   midodrine   5 mg Oral BID WC   multivitamin with minerals  1 tablet Oral Daily   pantoprazole   40 mg Oral Daily   polyethylene glycol  17 g Oral BID   predniSONE   5 mg Oral Q breakfast   pregabalin   25 mg Oral TID   prochlorperazine   10 mg Intravenous Once   rosuvastatin   20 mg Oral Daily   senna-docusate  2  tablet Oral QHS   sodium chloride  flush  3 mL Intravenous Q12H   sodium phosphate   1 enema Rectal Once   tamsulosin   0.4 mg Oral QPC supper   Vitamin D  (Ergocalciferol )  50,000 Units Oral Q Sun   Continuous Infusions:  cefTRIAXone  (ROCEPHIN )  IV Stopped (01/26/24 2210)    LOS: 1 day   Colin Dawley M.D on 01/27/2024 at 12:37 PM  Go to [email or web address] - for contact info  [facility] - Office  [phone]  If 7PM-7AM, please contact night-coverage [email or web address] 01/27/2024, 12:37 PM

## 2024-01-28 ENCOUNTER — Ambulatory Visit: Admitting: "Endocrinology

## 2024-01-28 DIAGNOSIS — K219 Gastro-esophageal reflux disease without esophagitis: Secondary | ICD-10-CM

## 2024-01-28 DIAGNOSIS — I272 Pulmonary hypertension, unspecified: Secondary | ICD-10-CM

## 2024-01-28 DIAGNOSIS — M5459 Other low back pain: Secondary | ICD-10-CM

## 2024-01-28 DIAGNOSIS — M545 Low back pain, unspecified: Secondary | ICD-10-CM | POA: Diagnosis not present

## 2024-01-28 DIAGNOSIS — D860 Sarcoidosis of lung: Secondary | ICD-10-CM | POA: Diagnosis not present

## 2024-01-28 DIAGNOSIS — G4733 Obstructive sleep apnea (adult) (pediatric): Secondary | ICD-10-CM

## 2024-01-28 DIAGNOSIS — J9611 Chronic respiratory failure with hypoxia: Secondary | ICD-10-CM | POA: Diagnosis not present

## 2024-01-28 DIAGNOSIS — J849 Interstitial pulmonary disease, unspecified: Secondary | ICD-10-CM | POA: Diagnosis not present

## 2024-01-28 DIAGNOSIS — E039 Hypothyroidism, unspecified: Secondary | ICD-10-CM | POA: Diagnosis not present

## 2024-01-28 LAB — URINE CULTURE: Culture: >=100000 — AB

## 2024-01-28 LAB — BLOOD GAS, VENOUS
Acid-Base Excess: 13.5 mmol/L — ABNORMAL HIGH (ref 0.0–2.0)
Bicarbonate: 41.8 mmol/L — ABNORMAL HIGH (ref 20.0–28.0)
Drawn by: 59174
O2 Saturation: 56.5 %
Patient temperature: 36.5
pCO2, Ven: 68 mmHg — ABNORMAL HIGH (ref 44–60)
pH, Ven: 7.4 (ref 7.25–7.43)
pO2, Ven: 34 mmHg (ref 32–45)

## 2024-01-28 MED ORDER — CHLORHEXIDINE GLUCONATE CLOTH 2 % EX PADS
6.0000 | MEDICATED_PAD | Freq: Every day | CUTANEOUS | Status: DC
  Administered 2024-01-28 – 2024-02-06 (×10): 6 via TOPICAL

## 2024-01-28 MED ORDER — ARFORMOTEROL TARTRATE 15 MCG/2ML IN NEBU
15.0000 ug | INHALATION_SOLUTION | Freq: Two times a day (BID) | RESPIRATORY_TRACT | Status: DC
  Administered 2024-01-28 – 2024-02-06 (×16): 15 ug via RESPIRATORY_TRACT
  Filled 2024-01-28 (×18): qty 2

## 2024-01-28 MED ORDER — REVEFENACIN 175 MCG/3ML IN SOLN
175.0000 ug | Freq: Every day | RESPIRATORY_TRACT | Status: DC
  Administered 2024-01-29 – 2024-02-06 (×9): 175 ug via RESPIRATORY_TRACT
  Filled 2024-01-28 (×9): qty 3

## 2024-01-28 NOTE — Progress Notes (Signed)
 PROGRESS NOTE  Kathleen [name] NWG:[id number] DOB: 10-03-1960   PCP: Bennet Brasil, MD  Patient is from: Home.  Transfer from [place]  DOA: 01/25/2024 LOS: 2  Chief complaints Chief Complaint  Patient presents with   Back Pain     Brief Narrative / Interim history: 63 year old F with PMH of ILD, sarcoidosis, chronic hypoxic RF on 5 L by [facility], OSA not on CPAP, morbid obesity, hypothyroidism, Addison's disease, anxiety, depression, anemia, osteoporosis, bilateral hip AVN, chronic back pain and lumbar compression fractures involving L4, L3 and L1 returning with worsening lumbar and pelvic pain after her recent hospitalization from 5/26-5/29 for L1 compression fracture.  Patient was to follow-up with orthopedic surgery, Dr. [name] after her recent hospitalization but return to ED due to intractable lower back pain.  She is found to have acute left pubic symphysis and right sacral alla fracture.  Initially admitted to an [facility].  After discussion with IR, she was transferred to [facility] for L3 and L4 kyphoplasty.    Subjective: Seen and examined earlier this morning.  No major events overnight or this morning.  Sleepy but wakes to voice.  She is not alert.  She states "my back is killing me".  No other complaints but sleepy.  Seems to have some intermittent extremity jerking.  Reports history of OSA but denies using CPAP.  Objective: Vitals:   01/27/24 2100 01/28/24 0416 01/28/24 0817 01/28/24 0818  BP:  (!) 118/40 (!) 117/55   Pulse: 100 97 95 97  Resp:  17 20 16   Temp:  98 F (36.7 C) 97.7 F (36.5 C)   TempSrc:  Oral Oral   SpO2:  98% 98% 97%  Weight:      Height:        Examination:  GENERAL: No apparent distress.  Nontoxic. HEENT: MMM.  Vision and hearing grossly intact.  NECK: Supple.  No apparent JVD.  RESP:  No IWOB.  Fair aeration bilaterally. CVS:  RRR. Heart sounds normal.  ABD/GI/GU: BS+. Abd soft, NTND.  MSK/EXT:  Moves extremities. No apparent  deformity. No edema.  SKIN: no apparent skin lesion or wound NEURO: Sleepy but wakes to voice.  Oriented but not quite alert.  Noted intermittent extremity jerking.  Symmetric strength in BLE.  No apparent focal neuro deficit. PSYCH: Calm. Normal affect.   Consultants:  Interventional radiology  Procedures: None  Microbiology summarized: Urine culture with pansensitive E. coli  Assessment and plan: Intractable Acute on chronic lower back pain due to osteoporotic compression fracture involving L1, L3 & L4: Acute left pubic symphyseal and right sacral alar fracture-initial encounter -Bone density test from 02/23/2020 showed T-score of -2.3 (osteopenic range).  On chronic prednisone  for ILD -Patient is followed by endocrinology and has upcoming bone density test in 2 months. -Multiple hospitalizations for compression fractures: 4/12-4/16 L4, 5/9-5/14 L3  and 5/26-5/29 L1. -NIR Attending Dr. Alvira Josephs has approved a L3/L4 kyphoplasty pending clearance from anesthesia team  -Pain control with p.o. and IV pain medications -TLSO brace as needed -Aggressive bowel regimen  Sarcoidosis, ILD, OSA not on CPAP, chronic hypoxic RF on 5 L: [facility].  -Continue home prednisone  5 mg for sarcoidosis/ILD, incentive spirometry, nebs as needed -Check VBG.  Has intermittent jerking.  At risk for CO2 retention -Minimum oxygen  to keep saturation above 88% regardless of home 5 L.  High risk for CO2 retention.  Left femoral head avascular necrosis-not surgical candidate -Supportive care  E. coli UTI: Urine culture with pansensitive E. coli. - Completed 3 days of IV ceftriaxone , 6/1 through 6/3.  Other chronic medical problems: Addison's disease: Continue home hydrocortisone .  Not sure why she is on prednisone  and Cortef  at the same time Chronic hypotension: C/n Home midodrine  5 mg twice daily and hydrocortisone  Hyperlipidemia: Continue home rosuvastatin  Hypothyroidism:  Continue home levothyroxine  AVN hips: Noted, not a surgical candidate Urinary retention: Continue on tamsulosin  Chronic anemia: Stable around 9, defer additional evaluation to outpatient. Acute on Chronic pain syndrome: See acute pain management above Anxiety and depression: Continue home buspirone , cymbalta , prn atarax   Morbid obesity Body mass index is 39.75 kg/m. Nutrition Problem: Increased nutrient needs Etiology: acute illness Signs/Symptoms: estimated needs Interventions: Ensure Enlive (each supplement provides 350kcal and 20 grams of protein), MVI, Liberalize Diet   DVT prophylaxis:  SCDs Start: 01/26/24 0425  Code Status: Full code Family Communication: Family member at bedside but sleeping. Level of care: Med-Surg Status is: Inpatient Remains inpatient appropriate because: Intractable lower back pain due to compression fractures   Final disposition: Home   55 minutes with more than 50% spent in reviewing records, counseling patient/family and coordinating care.   Sch Meds:  Scheduled Meds:  alendronate   70 mg Oral QAC breakfast   budesonide   0.5 mg Nebulization BID   busPIRone   15 mg Oral TID   cyclobenzaprine   5 mg Oral TID   DULoxetine   60 mg Oral Daily   enoxaparin  (LOVENOX ) injection  60 mg Subcutaneous Q24H   feeding supplement  237 mL Oral BID BM   ferrous sulfate   325 mg Oral Weekly   furosemide   20 mg Oral Q M,W,F   hydrocortisone   10 mg Oral Q lunch   hydrocortisone   20 mg Oral Daily   ipratropium-albuterol   3 mL Nebulization BID   levothyroxine   50 mcg Oral QAC breakfast   lidocaine   1 patch Transdermal Q24H   linaclotide   72 mcg Oral QAC breakfast   midodrine   5 mg Oral BID WC   multivitamin with minerals  1 tablet Oral Daily   pantoprazole   40 mg Oral Daily   polyethylene glycol  17 g Oral BID   predniSONE   5 mg Oral Q breakfast   prochlorperazine   10 mg Intravenous Once   rosuvastatin   20 mg Oral Daily   senna-docusate  2 tablet Oral QHS    sodium chloride  flush  3 mL Intravenous Q12H   sodium phosphate   1 enema Rectal Once   tamsulosin   0.4 mg Oral QPC supper   Vitamin D  (Ergocalciferol )  50,000 Units Oral Q Sun   Continuous Infusions:  cefTRIAXone  (ROCEPHIN )  IV 1 g (01/28/24 0044)   PRN Meds:.acetaminophen  **OR** acetaminophen , albuterol , HYDROmorphone  (DILAUDID ) injection, hydrOXYzine , lactulose , ondansetron  **OR** ondansetron  (ZOFRAN ) IV, oxyCODONE   Antimicrobials: Anti-infectives (From admission, onward)    Start     Dose/Rate Route Frequency Ordered Stop   01/26/24 2200  cefTRIAXone  (ROCEPHIN ) 1 g in sodium chloride  0.9 % 100 mL IVPB        1 g 200 mL/hr over 30 Minutes Intravenous Every 24 hours 01/26/24 1109     01/26/24 0145  cefTRIAXone  (ROCEPHIN ) 1 g in sodium chloride  0.9 % 100 mL IVPB        1 g 200 mL/hr over 30 Minutes Intravenous  Once 01/26/24 0139 01/26/24 0235        I have personally reviewed the following labs and images: CBC: Recent Labs  Lab 01/25/24 2128 01/26/24 0523 01/27/24  0436  WBC 9.1 9.0 9.7  NEUTROABS 6.8  --   --   HGB 9.1* 8.8* 9.1*  HCT 30.2* 29.4* 31.0*  MCV 98.1 98.0 99.0  PLT 415* 423* 388   BMP &GFR Recent Labs  Lab 01/22/24 0431 01/24/24 1419 01/25/24 2128 01/26/24 0523 01/27/24 0436  NA 143 140 141 141 141  K 3.9 4.9 3.9 3.7 3.9  CL 100 94* 98 100 103  CO2 37* 30* 34* 33* 29  GLUCOSE 116* 145* 128* 102* 117*  BUN 14 8 8 11 8   CREATININE 0.79 0.77 0.80 0.72 0.81  CALCIUM  9.1 9.3 8.9 8.8* 8.8*  MG 2.3  --   --  2.2  --   PHOS 4.5  --   --  3.9  --    Estimated Creatinine Clearance: 105.1 mL/min (by C-G formula based on SCr of 0.81 mg/dL). Liver & Pancreas: Recent Labs  Lab 01/24/24 1419 01/25/24 2128 01/26/24 0523  AST 20 18 17   ALT 23 25 23   ALKPHOS 129* 96 92  BILITOT 0.2 0.5 0.4  PROT 5.8* 5.4* 5.5*  ALBUMIN 3.8* 2.9* 2.8*   No results for input(s): "LIPASE", "AMYLASE" in the last 168 hours. No results for input(s): "AMMONIA " in the  last 168 hours. Diabetic: No results for input(s): "HGBA1C" in the last 72 hours. No results for input(s): "GLUCAP" in the last 168 hours. Cardiac Enzymes: No results for input(s): "CKTOTAL", "CKMB", "CKMBINDEX", "TROPONINI" in the last 168 hours. No results for input(s): "PROBNP" in the last 8760 hours. Coagulation Profile: No results for input(s): "INR", "PROTIME" in the last 168 hours. Thyroid  Function Tests: No results for input(s): "TSH", "T4TOTAL", "FREET4", "T3FREE", "THYROIDAB" in the last 72 hours. Lipid Profile: No results for input(s): "CHOL", "HDL", "LDLCALC", "TRIG", "CHOLHDL", "LDLDIRECT" in the last 72 hours. Anemia Panel: Recent Labs    01/26/24 1106  VITAMINB12 429  FOLATE 9.2  FERRITIN 19  TIBC 308  IRON 39  RETICCTPCT 3.0   Urine analysis:    Component Value Date/Time   COLORURINE YELLOW 01/25/2024 2308   APPEARANCEUR HAZY (A) 01/25/2024 2308   LABSPEC 1.017 01/25/2024 2308   PHURINE 6.0 01/25/2024 2308   GLUCOSEU NEGATIVE 01/25/2024 2308   GLUCOSEU NEGATIVE 05/29/2010 1622   HGBUR NEGATIVE 01/25/2024 2308   BILIRUBINUR NEGATIVE 01/25/2024 2308   BILIRUBINUR + 11/08/2020 1604   KETONESUR NEGATIVE 01/25/2024 2308   PROTEINUR NEGATIVE 01/25/2024 2308   UROBILINOGEN 1.0 04/30/2020 0831   UROBILINOGEN 0.2 06/16/2014 0942   NITRITE POSITIVE (A) 01/25/2024 2308   LEUKOCYTESUR SMALL (A) 01/25/2024 2308   Sepsis Labs: Invalid input(s): "PROCALCITONIN", "LACTICIDVEN"  Microbiology: Recent Results (from the past 240 hours)  Urine Culture     Status: Abnormal   Collection Time: 01/25/24 11:08 PM   Specimen: Urine, Clean Catch  Result Value Ref Range Status   Specimen Description   Final    URINE, CLEAN CATCH Performed at [facility], [address], [place], [place] [address]    Special Requests   Final    NONE Performed at [facility], [address] [place], [place] [address]    Culture >=100,000 COLONIES/mL ESCHERICHIA COLI (A)  Final   Report  Status 01/28/2024 FINAL  Final   Organism ID, Bacteria ESCHERICHIA COLI (A)  Final      Susceptibility   Escherichia coli - MIC*    AMPICILLIN 8 SENSITIVE Sensitive     CEFAZOLIN  <=4 SENSITIVE Sensitive     CEFEPIME  <=0.12 SENSITIVE Sensitive  CEFTRIAXONE  <=0.25 SENSITIVE Sensitive     CIPROFLOXACIN  <=0.25 SENSITIVE Sensitive     GENTAMICIN <=1 SENSITIVE Sensitive     IMIPENEM <=0.25 SENSITIVE Sensitive     NITROFURANTOIN <=16 SENSITIVE Sensitive     TRIMETH /SULFA  <=20 SENSITIVE Sensitive     AMPICILLIN/SULBACTAM <=2 SENSITIVE Sensitive     PIP/TAZO <=4 SENSITIVE Sensitive ug/mL    * >=100,000 COLONIES/mL ESCHERICHIA COLI    Radiology Studies: No results found.    Kathleen [name] [facility]  If 7PM-7AM, please contact night-coverage [email or web address] 01/28/2024, 9:23 AM

## 2024-01-28 NOTE — Plan of Care (Signed)

## 2024-01-28 NOTE — Progress Notes (Signed)
 Patient c/o not being able to void, bladder scan showed 247. Patient has hx of retention, does I&O caths at home as needed. I&O cath done per policy patient 800 ml amber colored urine returned. 16 fr foley placed using sterile technique urine returned. Tarchie NT3 assisted    Matin Mattioli, Cammie Cellar, RN

## 2024-01-28 NOTE — Consult Note (Signed)
 NAME:  Kathleen [name], MRN:  [id number], DOB:  07-Sep-1960, LOS: 2 ADMISSION DATE:  01/25/2024, CONSULTATION DATE: 01/28/2024 REFERRING MD: [facility]ad, CHIEF COMPLAINT: Preop evaluation for pulmonary  History of Present Illness:  Kathleen [name] is a 63 year old female never smoker with a history of sarcoidosis prednisone  dependent for over 20 years, borderline sleep apnea has gained weight since the study was done.  She also has chronic pain and chronic anxiety for which he takes medication in the form of narcotics and benzodiazepines.  His osteoporosis and bone degeneration and now has been evaluated for lumbar kyphoplasty.  Pulmonary critical care asked to evaluate due to her body habitus she is at high risk for complications for any procedure.  Pertinent  Medical History   Past Medical History:  Diagnosis Date   Adrenal insufficiency (HCC)    Anemia    Anxiety    Avascular bone necrosis (HCC)    Breast fibrocystic disorder    Chronic pain    Encounter for long-term opiate analgesic use 02/02/2020   Patient has chronic pain with femoral head avascular necrosis.  Is under pain contract   Fibromyalgia    Gastroesophageal reflux disease    Hypothyroidism    Mitral valve prolapse    Orthostatic hypotension    Peripheral neuropathy    PONV (postoperative nausea and vomiting)    Restrictive lung disease    Moderate to severe   Sarcoidosis    Biopsy proven - [facility]   Sarcoidosis    SOB (shortness of breath)    chronic     Significant Hospital Events: Including procedures, antibiotic start and stop dates in addition to other pertinent events     Interim History / Subjective:  Patient with chronic pain is being evaluated for kyphoplasty  Objective    Blood pressure (!) 117/55, pulse 97, temperature 97.7 F (36.5 C), temperature source Oral, resp. rate 16, height 5\' 10"  (1.778 m), weight 125.6 kg, last menstrual period 05/17/2016, SpO2 97%.        Intake/Output Summary (Last 24 hours) at  01/28/2024 1237 Last data filed at 01/27/2024 1300 Gross per 24 hour  Intake 300 ml  Output --  Net 300 ml   Filed Weights   01/25/24 1908  Weight: 125.6 kg    Examination: General: Morbidly obese female with moon face HENT: No JVD is appreciated Lungs: Coarse expiratory wheeze Cardiovascular: Heart sounds are distant Abdomen: Distended and tender Extremities: Mild edema Neuro: Anxious with tremor GU: Voids  Resolved problem list   Assessment and Plan  Called for pulmonary clearance for the patient in need of lumbar kyphoplasty with a plethora of health issues that include morbid obesity, O2 dependent respiratory failure 5 L, borderline obstructive sleep apnea (CPAP, sarcoid treated with prednisone  for over 20 years she is intolerant of methotrexate.  Chronic pain for which she takes narcotics on a daily basis which has led to constipation and respiratory restriction.  She remains in a great deal of pain requesting narcotics.  She gets her care at [facility]  Pulmonary asked to express a degree of complexity and her chance for poor outcome. Morbidly obese female who due poorly in the supine position Poorly tolerated being on her lower abdomen. High risk for complications. Consider for prior intubation mechanical ventilatory support during the procedure and extubation post procedure.   Limit pain medications as tolerated She may need intensive care unit postprocedure Depending on venous gas she may need CPAP for CO2  retention Weight loss   Best Practice (right click and "Reselect all SmartList Selections" daily)   Diet/type: Regular consistency (see orders) DVT prophylaxis not indicated Pressure ulcer(s): N/A GI prophylaxis: PPI Lines: N/A Foley:  N/A Code Status:  full code Last date of multidisciplinary goals of care discussion [tbd]  Labs   CBC: Recent Labs  Lab 01/25/24 2128 01/26/24 0523 01/27/24 0436  WBC 9.1 9.0 9.7  NEUTROABS  6.8  --   --   HGB 9.1* 8.8* 9.1*  HCT 30.2* 29.4* 31.0*  MCV 98.1 98.0 99.0  PLT 415* 423* 388    Basic Metabolic Panel: Recent Labs  Lab 01/22/24 0431 01/24/24 1419 01/25/24 2128 01/26/24 0523 01/27/24 0436  NA 143 140 141 141 141  K 3.9 4.9 3.9 3.7 3.9  CL 100 94* 98 100 103  CO2 37* 30* 34* 33* 29  GLUCOSE 116* 145* 128* 102* 117*  BUN 14 8 8 11 8   CREATININE 0.79 0.77 0.80 0.72 0.81  CALCIUM  9.1 9.3 8.9 8.8* 8.8*  MG 2.3  --   --  2.2  --   PHOS 4.5  --   --  3.9  --    GFR: Estimated Creatinine Clearance: 105.1 mL/min (by C-G formula based on SCr of 0.81 mg/dL). Recent Labs  Lab 01/25/24 2128 01/26/24 0523 01/27/24 0436  WBC 9.1 9.0 9.7    Liver Function Tests: Recent Labs  Lab 01/24/24 1419 01/25/24 2128 01/26/24 0523  AST 20 18 17   ALT 23 25 23   ALKPHOS 129* 96 92  BILITOT 0.2 0.5 0.4  PROT 5.8* 5.4* 5.5*  ALBUMIN 3.8* 2.9* 2.8*   No results for input(s): "LIPASE", "AMYLASE" in the last 168 hours. No results for input(s): "AMMONIA " in the last 168 hours.  ABG    Component Value Date/Time   PHART 7.340 (L) 08/21/2016 2126   PCO2ART 44.7 08/21/2016 2126   PO2ART 151.0 (H) 08/21/2016 2126   HCO3 38.2 (H) 08/05/2023 0911   TCO2 25 08/21/2016 2126   ACIDBASEDEF 2.0 08/21/2016 2126   O2SAT 59.5 08/05/2023 0911     Coagulation Profile: No results for input(s): "INR", "PROTIME" in the last 168 hours.  Cardiac Enzymes: No results for input(s): "CKTOTAL", "CKMB", "CKMBINDEX", "TROPONINI" in the last 168 hours.  HbA1C: Hgb A1c MFr Bld  Date/Time Value Ref Range Status  07/26/2023 10:04 AM 6.2 (H) 4.8 - 5.6 % Final    Comment:             Prediabetes: 5.7 - 6.4          Diabetes: >6.4          Glycemic control for adults with diabetes: <7.0   08/04/2021 08:26 AM 5.4 4.8 - 5.6 % Final    Comment:             Prediabetes: 5.7 - 6.4          Diabetes: >6.4          Glycemic control for adults with diabetes: <7.0     CBG: No results for  input(s): "GLUCAP" in the last 168 hours.  Review of Systems:   10 point review of system taken, please see HPI for positives and negatives.   Past Medical History:  She,  has a past medical history of Adrenal insufficiency (HCC), Anemia, Anxiety, Avascular bone necrosis (HCC), Breast fibrocystic disorder, Chronic pain, Encounter for long-term opiate analgesic use (02/02/2020), Fibromyalgia, Gastroesophageal reflux disease, Hypothyroidism, Mitral valve prolapse, Orthostatic hypotension, Peripheral neuropathy,  PONV (postoperative nausea and vomiting), Restrictive lung disease, Sarcoidosis, Sarcoidosis, and SOB (shortness of breath).   Surgical History:   Past Surgical History:  Procedure Laterality Date   BIOPSY  10/17/2018   Procedure: BIOPSY;  Surgeon: Ruby Corporal, MD;  [facility];  Service: Endoscopy;;  duodenum, antrum, gastric body   BIOPSY  03/03/2021   Procedure: BIOPSY;  Surgeon: Ruby Corporal, MD;  [facility];  Service: Endoscopy;;   BIOPSY  09/11/2023   Procedure: BIOPSY;  Surgeon: Suzette Espy, MD;  [facility];  Service: Endoscopy;;   BREAST LUMPECTOMY Bilateral 01/12/2011   CHOLECYSTECTOMY N/A 04/17/2021   Procedure: LAPAROSCOPIC CHOLECYSTECTOMY;  Surgeon: Alanda Allegra, MD;  Location: [facility];  Service: General;  Laterality: N/A;   COLONOSCOPY WITH PROPOFOL  N/A 01/03/2022   Procedure: COLONOSCOPY WITH PROPOFOL ;  Surgeon: Ruby Corporal, MD;  [facility];  Service: Endoscopy;  Laterality: N/A;  220   ESOPHAGEAL DILATION N/A 03/03/2021   Procedure: ESOPHAGEAL DILATION;  Surgeon: Ruby Corporal, MD;  [facility];  Service: Endoscopy;  Laterality: N/A;   ESOPHAGOGASTRODUODENOSCOPY (EGD) WITH PROPOFOL  N/A 10/17/2018   Procedure: ESOPHAGOGASTRODUODENOSCOPY (EGD) WITH PROPOFOL ;  Surgeon: Ruby Corporal, MD;  [facility];  Service: Endoscopy;  Laterality: N/A;  2:25   ESOPHAGOGASTRODUODENOSCOPY  (EGD) WITH PROPOFOL  N/A 03/03/2021   Procedure: ESOPHAGOGASTRODUODENOSCOPY (EGD) WITH PROPOFOL ;  Surgeon: Ruby Corporal, MD;  [facility];  Service: Endoscopy;  Laterality: N/A;  12:15   ESOPHAGOGASTRODUODENOSCOPY (EGD) WITH PROPOFOL  N/A 09/11/2023   Procedure: ESOPHAGOGASTRODUODENOSCOPY (EGD) WITH PROPOFOL ;  Surgeon: Suzette Espy, MD;  [facility];  Service: Endoscopy;  Laterality: N/A;  2:30 pm, asa 3, LM to see if pt can come earlier   [facility] DILATION N/A 09/11/2023   Procedure: Londa Rival DILATION;  Surgeon: Suzette Espy, MD;  [facility];  Service: Endoscopy;  Laterality: N/A;   ORIF ANKLE FRACTURE Left 03/10/2023   Procedure: OPEN REDUCTION INTERNAL FIXATION (ORIF) ANKLE FRACTURE;  Surgeon: Diedra Fowler, MD;  Location: [facility];  Service: Orthopedics;  Laterality: Left;   POLYPECTOMY  01/03/2022   Procedure: POLYPECTOMY INTESTINAL;  Surgeon: Ruby Corporal, MD;  [facility];  Service: Endoscopy;;   TUBAL LIGATION       Social History:   reports that she has never smoked. She has never been exposed to tobacco smoke. She has never used smokeless tobacco. She reports that she does not drink alcohol and does not use drugs.   Family History:  Her family history includes Bipolar disorder in her daughter; Breast cancer in her mother; Cardiomyopathy in her mother; Heart attack in her maternal grandfather, maternal grandmother, and paternal grandmother; Heart disease in her maternal aunt; Neurofibromatosis in her cousin; Prostate cancer in her father. There is no history of Colon cancer.   Allergies Allergies  Allergen Reactions   Bee Venom Anaphylaxis   Contrast Media [Iodinated Contrast Media] Other (See Comments)    CT chest w/ contrast at OSH followed by SOB resolved w/ single dose steroids, never ENT swelling, intubation or pressors, has had multiple contrasted scans since   Morphine  Other (See Comments)    Substance with morphinan  structure and opioid receptor agonist mechanism of action (substance)   Bactrim  [Sulfamethoxazole -Trimethoprim ] Nausea And Vomiting   Codeine Itching     Home Medications  Prior to Admission medications   Medication Sig Start Date End Date Taking? Authorizing  Provider  acetaminophen  (TYLENOL ) 500 MG tablet Take 2 tablets (1,000 mg total) by mouth 3 (three) times daily. 01/08/24  Yes Mason Sole, Pratik D, DO  albuterol  (PROVENTIL ) (2.5 MG/3ML) 0.083% nebulizer solution Inhale 3 mL (2.5 mg total) by nebulization every four (4) hours as needed for wheezing or shortness of breath. 01/23/24  Yes Emokpae, Courage, MD  budesonide  (PULMICORT ) 0.5 MG/2ML nebulizer solution Inhale the contents of 1 vial via nebulizer two (2) times a day. 01/23/24  Yes Emokpae, Courage, MD  busPIRone  (BUSPAR ) 15 MG tablet Take 1 tablet (15 mg) by mouth 3 times daily. 01/23/24  Yes Colin Dawley, MD  calcitonin, salmon, (MIACALCIN /FORTICAL) 200 UNIT/ACT nasal spray Use 1 spray in one nostril per day, alternate nostrils every other day (may have 1 vial she should use this for at least 2 weeks but may use as long as 4 weeks) 01/10/24  Yes Luking, Jackelyn Marvel, MD  cyclobenzaprine  (FLEXERIL ) 5 MG tablet Take 1 tablet (5 mg total) by mouth 3 (three) times daily. 01/23/24  Yes Emokpae, Courage, MD  DULoxetine  (CYMBALTA ) 60 MG capsule Take 1 capsule (60 mg total) by mouth daily. 01/23/24  Yes Emokpae, Courage, MD  EPINEPHrine  0.3 mg/0.3 mL IJ SOAJ injection Inject 0.3 mg into the muscle as needed for anaphylaxis. 01/05/22  Yes Luking, Jackelyn Marvel, MD  ferrous sulfate  325 (65 FE) MG EC tablet Take 325 mg by mouth once a week.   Yes [provider]  furosemide  (LASIX ) 20 MG tablet Take 1 tablet (20 mg total) by mouth every Monday, Wednesday, and Friday. 07/17/23  Yes Luking, Jackelyn Marvel, MD  hydrocortisone  (CORTEF ) 10 MG tablet Take 2 tablets by mouth at 8:00 am and 1 tablet at lunch. (May double dose on sick days x 3-5 days) 11/14/23  Yes Nida,  Gebreselassie W, MD  hydrOXYzine  (ATARAX ) 25 MG tablet Take 1 tablet (25 mg total) by mouth 3 (three) times daily as needed for anxiety. 12/02/23 12/01/24 Yes Luking, Jackelyn Marvel, MD  ipratropium-albuterol  (DUONEB) 0.5-2.5 (3) MG/3ML SOLN Take 3 mLs by nebulization every 8 (eight) hours. 01/23/24  Yes Colin Dawley, MD  lactulose  (CHRONULAC ) 10 GM/15ML solution Take 15 mLs (10 g total) by mouth daily as needed for mild constipation. 01/23/24  Yes Colin Dawley, MD  levothyroxine  (SYNTHROID ) 50 MCG tablet Take 1 tablet by mouth daily before breakfast. 11/22/23 05/20/24 Yes Nida, Gebreselassie W, MD  lidocaine  (LIDODERM ) 5 % Place 1 patch onto the skin daily. Remove & Discard patch within 12 hours or as directed by MD 04/09/23  Yes Mason Sole, Pratik D, DO  LINZESS  72 MCG capsule Take 1 capsule (72 mcg total) by mouth daily before breakfast. 01/23/24  Yes Emokpae, Courage, MD  midodrine  (PROAMATINE ) 5 MG tablet Take 1 tablet (5 mg total) by mouth 2 (two) times daily with a meal. 06/19/23 06/18/24 Yes Luking, Jackelyn Marvel, MD  ondansetron  (ZOFRAN -ODT) 8 MG disintegrating tablet Dissolve 1 tablet (8 mg total) by mouth every 8 (eight) hours as needed. Patient taking differently: Take 8 mg by mouth every 8 (eight) hours as needed for nausea or vomiting. 04/23/23 04/22/24 Yes Luking, Jackelyn Marvel, MD  oxyCODONE  (ROXICODONE ) 15 MG immediate release tablet Take 1 tablet (15 mg total) by mouth every 4 (four) hours as needed for moderate pain (pain score 4-6) or severe pain (pain score 7-10). 01/23/24  Yes Emokpae, Courage, MD  OXYGEN  Inhale 5 L into the lungs continuous.   Yes [provider]  pantoprazole  (PROTONIX ) 40 MG tablet Take 1  tablet (40 mg total) by mouth daily. 01/23/24 01/22/25 Yes Emokpae, Courage, MD  polyethylene glycol (MIRALAX  / GLYCOLAX ) 17 g packet Take 17 g by mouth 2 (two) times daily. 01/23/24  Yes Colin Dawley, MD  predniSONE  (DELTASONE ) 5 MG tablet Take 1 tablet (5 mg total) by mouth in the morning.  08/01/23  Yes   rosuvastatin  (CRESTOR ) 20 MG tablet Take 1 tablet (20 mg total) by mouth daily. 08/12/23  Yes Luking, Scott A, MD  senna-docusate (SENOKOT-S) 8.6-50 MG tablet Take 2 tablets by mouth at bedtime. 01/23/24  Yes Emokpae, Courage, MD  tamsulosin  (FLOMAX ) 0.4 MG CAPS capsule Take 1 capsule (0.4 mg total) by mouth daily after supper. 01/10/24  Yes Luking, Jackelyn Marvel, MD  Vitamin D , Ergocalciferol , (DRISDOL ) 1.25 MG (50000 UNIT) CAPS capsule Take 1 capsule (50,000 Units total) by mouth every Sunday. 01/26/24  Yes Colin Dawley, MD     Critical care time: Bonnee Bustle Jobanny Mavis ACNP Acute Care Nurse Practitioner Jonny Neu Pulmonary/Critical Care Please consult Amion 01/28/2024, 12:37 PM

## 2024-01-29 DIAGNOSIS — S32030A Wedge compression fracture of third lumbar vertebra, initial encounter for closed fracture: Secondary | ICD-10-CM

## 2024-01-29 DIAGNOSIS — M545 Low back pain, unspecified: Secondary | ICD-10-CM

## 2024-01-29 DIAGNOSIS — M4850XA Collapsed vertebra, not elsewhere classified, site unspecified, initial encounter for fracture: Secondary | ICD-10-CM

## 2024-01-29 DIAGNOSIS — S32040A Wedge compression fracture of fourth lumbar vertebra, initial encounter for closed fracture: Secondary | ICD-10-CM

## 2024-01-29 LAB — CBC
HCT: 27.3 % — ABNORMAL LOW (ref 36.0–46.0)
Hemoglobin: 8.3 g/dL — ABNORMAL LOW (ref 12.0–15.0)
MCH: 29.3 pg (ref 26.0–34.0)
MCHC: 30.4 g/dL (ref 30.0–36.0)
MCV: 96.5 fL (ref 80.0–100.0)
Platelets: 391 10*3/uL (ref 150–400)
RBC: 2.83 MIL/uL — ABNORMAL LOW (ref 3.87–5.11)
RDW: 15.9 % — ABNORMAL HIGH (ref 11.5–15.5)
WBC: 9.3 10*3/uL (ref 4.0–10.5)
nRBC: 0.6 % — ABNORMAL HIGH (ref 0.0–0.2)

## 2024-01-29 LAB — RENAL FUNCTION PANEL
Albumin: 2.6 g/dL — ABNORMAL LOW (ref 3.5–5.0)
Anion gap: 9 (ref 5–15)
BUN: 9 mg/dL (ref 8–23)
CO2: 34 mmol/L — ABNORMAL HIGH (ref 22–32)
Calcium: 8.8 mg/dL — ABNORMAL LOW (ref 8.9–10.3)
Chloride: 94 mmol/L — ABNORMAL LOW (ref 98–111)
Creatinine, Ser: 0.86 mg/dL (ref 0.44–1.00)
GFR, Estimated: >60 mL/min (ref >60)
Glucose, Bld: 108 mg/dL — ABNORMAL HIGH (ref 70–99)
Phosphorus: 4.1 mg/dL (ref 2.5–4.6)
Potassium: 3.8 mmol/L (ref 3.5–5.1)
Sodium: 137 mmol/L (ref 135–145)

## 2024-01-29 LAB — MAGNESIUM: Magnesium: 2 mg/dL (ref 1.7–2.4)

## 2024-01-29 MED ORDER — POLYETHYLENE GLYCOL 3350 17 G PO PACK
17.0000 g | PACK | Freq: Two times a day (BID) | ORAL | Status: AC
  Administered 2024-01-30: 17 g via ORAL
  Filled 2024-01-29 (×3): qty 1

## 2024-01-29 MED ORDER — OXYCODONE HCL 5 MG PO TABS
30.0000 mg | ORAL_TABLET | ORAL | Status: DC | PRN
  Administered 2024-01-30 – 2024-02-06 (×33): 30 mg via ORAL
  Filled 2024-01-29 (×34): qty 6

## 2024-01-29 NOTE — Progress Notes (Signed)
 Physical Therapy Treatment Patient Details Name: Kathleen [name] MRN: [id number] DOB: 05-Jan-1961 Today's Date: 01/29/2024   History of Present Illness Holy R Felmlee is a 63 y.o. female admitted with intractable back pain she was found to have acute left pubic symphysis and right sacral alar fracture initially admitted to [facility] after discussion with IR was transferred to [facility] for L3 and L4 kyphoplasty.  PMH of chronic hypoxic respiratory failure (5L O2), ILD, sarcoidosis, Addison's disease, morbid obesity, bilateral hip AVN, osteoporosis with multiple vertebral fractures on chronic narcotics, and recent admissions for intractable pain due to compression fractures of L4 (4/12-4/16) and then L3 (5/9-5/14), readmitted on 01/20/2024 with acute L1 fracture, discharged home on 01/23/2024    PT Comments  Pt received in supine and agreeable to session. Pt reports nausea and back pain today. Pt reports need for BM and is agreeable to attempt transfer to [facility]. Pt requires cues for logroll and up to max A to attempt to sit to EOB, however is unable to tolerate due to increased pain. Pt requires increased assist to return to supine and for repositioning. Pt continues to benefit from PT services to progress toward functional mobility goals.     If plan is discharge home, recommend the following: A lot of help with walking and/or transfers;A lot of help with bathing/dressing/bathroom;Help with stairs or ramp for entrance;Assistance with cooking/housework   Can travel by private vehicle     No  Equipment Recommendations  None recommended by PT    Recommendations for Other Services       Precautions / Restrictions Precautions Precautions: Fall Recall of Precautions/Restrictions: Intact Precaution/Restrictions Comments: LSO brace (as needed) Restrictions Weight Bearing Restrictions Per Provider Order: No     Mobility  Bed Mobility Overal bed mobility: Needs Assistance Bed Mobility: Sit to  Supine, Rolling, Sidelying to Sit Rolling: Min assist Sidelying to sit: Max assist, HOB elevated, Used rails   Sit to supine: Mod assist   General bed mobility comments: Pt attempted to sit to EOB, however reached partial trunk elevation and pt unable to tolerate. Pt returned to supine with assist for BLE elevation to EOB and total A +2 for supine scoot towards [facility]    Transfers                   General transfer comment: unable    Ambulation/Gait                   Stairs             Wheelchair Mobility     Tilt Bed    Modified Rankin (Stroke Patients Only)       Balance Overall balance assessment: Needs assistance     Sitting balance - Comments: nt                                    Communication Communication Communication: No apparent difficulties  Cognition Arousal: Alert Behavior During Therapy: WFL for tasks assessed/performed   PT - Cognitive impairments: No apparent impairments                         Following commands: Intact      Cueing Cueing Techniques: Verbal cues, Tactile cues  Exercises      General Comments        Pertinent Vitals/Pain Pain Assessment Pain Assessment: Faces  Faces Pain Scale: Hurts worst Pain Location: back Pain Descriptors / Indicators: Guarding, Grimacing, Moaning, Sharp, Sore Pain Intervention(s): Limited activity within patient's tolerance, Monitored during session, Repositioned     PT Goals (current goals can now be found in the care plan section) Acute Rehab PT Goals Patient Stated Goal: return home with family to assist PT Goal Formulation: With patient Time For Goal Achievement: 01/31/24 Progress towards PT goals: Not progressing toward goals - comment (limited by pain)    Frequency    Min 3X/week       AM-PAC PT "6 Clicks" Mobility   Outcome Measure  Help needed turning from your back to your side while in a flat bed without using bedrails?: A  Little Help needed moving from lying on your back to sitting on the side of a flat bed without using bedrails?: A Lot Help needed moving to and from a bed to a chair (including a wheelchair)?: Total Help needed standing up from a chair using your arms (e.g., wheelchair or bedside chair)?: Total Help needed to walk in hospital room?: Total Help needed climbing 3-5 steps with a railing? : Total 6 Click Score: 9    End of Session Equipment Utilized During Treatment: Oxygen  Activity Tolerance: Patient limited by pain Patient left: in bed;with family/visitor present;with call bell/phone within reach Nurse Communication: Mobility status;Patient requests pain meds;Other (comment) (need for BM, but unable) PT Visit Diagnosis: Unsteadiness on feet (R26.81);Other abnormalities of gait and mobility (R26.89);Muscle weakness (generalized) (M62.81)     Time: 1610-9604 PT Time Calculation (min) (ACUTE ONLY): 21 min  Charges:    $Therapeutic Activity: 8-22 mins PT General Charges $$ ACUTE PT VISIT: 1 Visit                     Michaelle Adolphus, PTA Acute Rehabilitation Services Secure Chat Preferred  [phone]    Michaelle Adolphus 01/29/2024, 3:27 PM

## 2024-01-29 NOTE — Progress Notes (Signed)
 Referring Physician(s): Dr. Macdonald Savoy  Supervising Physician: Luellen Sages  Patient Status:  [name] - In-pt  Chief Complaint: Intractable back pain  Subjective: Patient lying in bed on 5L O2.  Reports ongoing back pain.  Falls asleep easily, but readily arousable and fully participatory in conversation regarding KP.  Husband at bedside.  Daughter available by phone.   Allergies: Bee venom, Contrast media [iodinated contrast media], Morphine , Bactrim  [sulfamethoxazole -trimethoprim ], and Codeine  Medications: Prior to Admission medications   Medication Sig Start Date End Date Taking? Authorizing Provider  acetaminophen  (TYLENOL ) 500 MG tablet Take 2 tablets (1,000 mg total) by mouth 3 (three) times daily. 01/08/24  Yes Mason Sole, Pratik D, DO  albuterol  (PROVENTIL ) (2.5 MG/3ML) 0.083% nebulizer solution Inhale 3 mL (2.5 mg total) by nebulization every four (4) hours as needed for wheezing or shortness of breath. 01/23/24  Yes Emokpae, Courage, MD  budesonide  (PULMICORT ) 0.5 MG/2ML nebulizer solution Inhale the contents of 1 vial via nebulizer two (2) times a day. 01/23/24  Yes Emokpae, Courage, MD  busPIRone  (BUSPAR ) 15 MG tablet Take 1 tablet (15 mg) by mouth 3 times daily. 01/23/24  Yes Colin Dawley, MD  calcitonin, salmon, (MIACALCIN /FORTICAL) 200 UNIT/ACT nasal spray Use 1 spray in one nostril per day, alternate nostrils every other day (may have 1 vial she should use this for at least 2 weeks but may use as long as 4 weeks) 01/10/24  Yes Luking, Jackelyn Marvel, MD  cyclobenzaprine  (FLEXERIL ) 5 MG tablet Take 1 tablet (5 mg total) by mouth 3 (three) times daily. 01/23/24  Yes Emokpae, Courage, MD  DULoxetine  (CYMBALTA ) 60 MG capsule Take 1 capsule (60 mg total) by mouth daily. 01/23/24  Yes Emokpae, Courage, MD  EPINEPHrine  0.3 mg/0.3 mL IJ SOAJ injection Inject 0.3 mg into the muscle as needed for anaphylaxis. 01/05/22  Yes Luking, Scott A, MD  ferrous sulfate  325 (65 FE) MG EC  tablet Take 325 mg by mouth once a week.   Yes [provider]  furosemide  (LASIX ) 20 MG tablet Take 1 tablet (20 mg total) by mouth every Monday, Wednesday, and Friday. 07/17/23  Yes Bennet Brasil, MD  hydrocortisone  (CORTEF ) 10 MG tablet Take 2 tablets by mouth at 8:00 am and 1 tablet at lunch. (May double dose on sick days x 3-5 days) 11/14/23  Yes Nida, Jaynee Meyer, MD  hydrOXYzine  (ATARAX ) 25 MG tablet Take 1 tablet (25 mg total) by mouth 3 (three) times daily as needed for anxiety. 12/02/23 12/01/24 Yes Luking, Jackelyn Marvel, MD  ipratropium-albuterol  (DUONEB) 0.5-2.5 (3) MG/3ML SOLN Take 3 mLs by nebulization every 8 (eight) hours. 01/23/24  Yes Emokpae, Courage, MD  lactulose  (CHRONULAC ) 10 GM/15ML solution Take 15 mLs (10 g total) by mouth daily as needed for mild constipation. 01/23/24  Yes Colin Dawley, MD  levothyroxine  (SYNTHROID ) 50 MCG tablet Take 1 tablet by mouth daily before breakfast. 11/22/23 05/20/24 Yes Nida, Gebreselassie W, MD  lidocaine  (LIDODERM ) 5 % Place 1 patch onto the skin daily. Remove & Discard patch within 12 hours or as directed by MD 04/09/23  Yes Mason Sole, Pratik D, DO  LINZESS  72 MCG capsule Take 1 capsule (72 mcg total) by mouth daily before breakfast. 01/23/24  Yes Emokpae, Courage, MD  midodrine  (PROAMATINE ) 5 MG tablet Take 1 tablet (5 mg total) by mouth 2 (two) times daily with a meal. 06/19/23 06/18/24 Yes Luking, Jackelyn Marvel, MD  ondansetron  (ZOFRAN -ODT) 8 MG disintegrating tablet Dissolve 1 tablet (8 mg total) by mouth  every 8 (eight) hours as needed. Patient taking differently: Take 8 mg by mouth every 8 (eight) hours as needed for nausea or vomiting. 04/23/23 04/22/24 Yes Luking, Jackelyn Marvel, MD  oxyCODONE  (ROXICODONE ) 15 MG immediate release tablet Take 1 tablet (15 mg total) by mouth every 4 (four) hours as needed for moderate pain (pain score 4-6) or severe pain (pain score 7-10). 01/23/24  Yes Emokpae, Courage, MD  OXYGEN  Inhale 5 L into the lungs continuous.   Yes  [provider]  pantoprazole  (PROTONIX ) 40 MG tablet Take 1 tablet (40 mg total) by mouth daily. 01/23/24 01/22/25 Yes Emokpae, Courage, MD  polyethylene glycol (MIRALAX  / GLYCOLAX ) 17 g packet Take 17 g by mouth 2 (two) times daily. 01/23/24  Yes Emokpae, Courage, MD  predniSONE  (DELTASONE ) 5 MG tablet Take 1 tablet (5 mg total) by mouth in the morning. 08/01/23  Yes   rosuvastatin  (CRESTOR ) 20 MG tablet Take 1 tablet (20 mg total) by mouth daily. 08/12/23  Yes Luking, Scott A, MD  senna-docusate (SENOKOT-S) 8.6-50 MG tablet Take 2 tablets by mouth at bedtime. 01/23/24  Yes Emokpae, Courage, MD  tamsulosin  (FLOMAX ) 0.4 MG CAPS capsule Take 1 capsule (0.4 mg total) by mouth daily after supper. 01/10/24  Yes Bennet Brasil, MD  Vitamin D , Ergocalciferol , (DRISDOL ) 1.25 MG (50000 UNIT) CAPS capsule Take 1 capsule (50,000 Units total) by mouth every Sunday. 01/26/24  Yes Colin Dawley, MD     Vital Signs: BP 126/63 (BP Location: Left Arm)   Pulse 92   Temp 98 F (36.7 C) (Oral)   Resp 16   Ht 5\' 10"  (1.778 m)   Wt 277 lb (125.6 kg)   LMP 05/17/2016   SpO2 96%   BMI 39.75 kg/m   Physical Exam NAD, alert Resp: increased respiratory effort at baseline. Audible wheezes. On 5L O2 via nasal cannula.   Imaging: CT PELVIS WO CONTRAST Result Date: 01/25/2024 CLINICAL DATA:  Pelvic pain, chronic steroid use, lumbar compression fractures EXAM: CT PELVIS WITHOUT CONTRAST TECHNIQUE: Multidetector CT imaging of the pelvis was performed following the standard protocol without intravenous contrast. RADIATION DOSE REDUCTION: This exam was performed according to the departmental dose-optimization program which includes automated exposure control, adjustment of the mA and/or kV according to patient size and/or use of iterative reconstruction technique. COMPARISON:  None Available. FINDINGS: Urinary Tract:  No abnormality visualized. Bowel:  Unremarkable visualized pelvic bowel loops.  Vascular/Lymphatic: No pathologically enlarged lymph nodes. No significant vascular abnormality seen. Reproductive: The pelvic organs are unremarkable. No free intraperitoneal fluid within the pelvis. Other:  No abdominal wall hernia Musculoskeletal: Acute left pubic symphyseal and right sacral alar compression fractures are identified in keeping with a lateral compression type 1 injury. The sacroiliac joint spaces are preserved. Pubic symphyseal joint space is preserved. No other fracture identified. No dislocation. Avascular necrosis of the left femoral head noted with mild articular collapse. Joint space appears preserved, however, mild degenerative arthritis is present with tiny osteophyte formation noted. L4 superior endplate fracture noted. IMPRESSION: 1. Acute left pubic symphyseal and right sacral alar compression fractures in keeping with a lateral compression type 1 injury. 2. L4 superior endplate fracture. 3. Avascular necrosis of the left femoral head with mild articular collapse. Electronically Signed   By: Worthy Heads M.D.   On: 01/25/2024 23:25   CT Lumbar Spine Wo Contrast Result Date: 01/25/2024 CLINICAL DATA:  Low back pain, constant or radicular pain, no prior imaging (Ped 0-17y). Chronic steroid use, compression fracture.  EXAM: CT LUMBAR SPINE WITHOUT CONTRAST TECHNIQUE: Multidetector CT imaging of the lumbar spine was performed without intravenous contrast administration. Multiplanar CT image reconstructions were also generated. RADIATION DOSE REDUCTION: This exam was performed according to the departmental dose-optimization program which includes automated exposure control, adjustment of the mA and/or kV according to patient size and/or use of iterative reconstruction technique. COMPARISON:  01/21/2024 FINDINGS: Segmentation: 5 lumbar type vertebrae. Alignment: Normal. Vertebrae: Superior endplate fracture of L1 is again seen and appears stable with proximally 10-15% loss of height and no  retropulsion. This is new since MRI examination of 01/03/2024 and is acute to subacute in nature. Stable superior endplate fractures of L3 and L4 are also no 25-30% loss of height and minimal (2-3 mm) retropulsion of the posterosuperior aspect of the vertebral bodies without significant associated canal stenosis. No interval fracture since prior examination. Remaining vertebral body height is preserved. The osseous structures are diffusely osteopenic. Paraspinal and other soft tissues: Negative. Disc levels: Intervertebral disc heights are preserved. Vacuum disc phenomena noted at L2-3. No significant canal stenosis. No significant neuroforaminal narrowing. IMPRESSION: 1. Stable superior endplate fractures of L1, L3 and L4 as described above. 2. No interval fracture since prior examination. 3. No significant canal stenosis or neuroforaminal narrowing. Electronically Signed   By: Worthy Heads M.D.   On: 01/25/2024 23:08   DG Lumbar Spine Complete Result Date: 01/25/2024 CLINICAL DATA:  Compression fracture, chronic steroid use, low back pain EXAM: LUMBAR SPINE - COMPLETE 4+ VIEW COMPARISON:  04/07/2020 FINDINGS: Interval development of superior endplate fractures of L1 with minimal loss of height. Stable superior endplate fractures of L3 and L4, acute to subacute and better assessed on prior MRI examination of 01/03/2024. No listhesis. Intervertebral disc heights are preserved. Paraspinal soft tissues are unremarkable. IMPRESSION: 1. Interval development of acute superior endplate fractures of L1 with minimal loss of height since prior MRI examination. 2. Stable superior endplate fractures L3 and L4, better assessed on MRI examination of 01/03/2024 four views are seen to be acute-subacute. Electronically Signed   By: Worthy Heads M.D.   On: 01/25/2024 21:04    Labs:  CBC: Recent Labs    01/25/24 2128 01/26/24 0523 01/27/24 0436 01/29/24 0604  WBC 9.1 9.0 9.7 9.3  HGB 9.1* 8.8* 9.1* 8.3*  HCT  30.2* 29.4* 31.0* 27.3*  PLT 415* 423* 388 391    COAGS: No results for input(s): "INR", "APTT" in the last 8760 hours.  BMP: Recent Labs    01/25/24 2128 01/26/24 0523 01/27/24 0436 01/29/24 0604  NA 141 141 141 137  K 3.9 3.7 3.9 3.8  CL 98 100 103 94*  CO2 34* 33* 29 34*  GLUCOSE 128* 102* 117* 108*  BUN 8 11 8 9   CALCIUM  8.9 8.8* 8.8* 8.8*  CREATININE 0.80 0.72 0.81 0.86  GFRNONAA >60 >60 >60 >60    LIVER FUNCTION TESTS: Recent Labs    10/25/23 1227 01/24/24 1419 01/25/24 2128 01/26/24 0523 01/29/24 0604  BILITOT <0.2 0.2 0.5 0.4  --   AST 15 20 18 17   --   ALT 31 23 25 23   --   ALKPHOS 80 129* 96 92  --   PROT 6.3 5.8* 5.4* 5.5*  --   ALBUMIN 4.3 3.8* 2.9* 2.8* 2.6*    Assessment and Plan: Acute back pain in the setting of L3/L4 compression fractures, bilateral hip avascular necrosis, left pubic symphysis and right sacral ala fracture Patient with acute lumbar pain from several acute fractures  as well as chronic degenerative bone disease from long-term steroid use.  She was transferred from [facility] to [name] for possible vertebroplasty/kyphoplasty as she is technically a candidate for these procedures, however upon consultation was noted to have severe pulmonary disease in the setting of advanced sarcoidosis and interstitial lung disease.  Given the increased risk of prone procedure with sedation, pulmonary and anesthesia consultations were requested.  Appreciate pulmonary assessment and anesthesia recommendations which have placed her at intermediate-high risk of pulmonary complications with all options for adequate airway protection and sedation/anesthesia with this procedure. Anesthesia was tentatively scheduled for Friday morning.   PA to bedside this afternoon to review case, care plan, and her options for moving forward.  Again, procedure reviewed in detail including merits, risks, and benefits.  Procedure can be accomplished with the intent to improve pain  management, however it is noted she also has additional fractures and degenerative bone disease which will not be treated with kyphoplasty. KP/VP also elevates her risk of additional fracture in the future.  She is resolute that she would not want to be intubated. Patient and daughter voice concerns that should there be respiratory compromise during the procedure this may not be recoverable.  After lengthy discussion, patient and family have decided to [facility] on procedure.  She requests additional information/options for TLSO.  Ordering MD made aware.   Insurance approval process will be held.  Anesthesia booking canceled.   IR remains available.   Electronically Signed: Tre Sanker Sue-Ellen Avonna Iribe, PA 01/29/2024, 4:27 PM   I spent a total of 35 Minutes at the the patient's bedside AND on the patient's [name] floor or unit, greater than 50% of which was counseling/coordinating care for L3, L4 compression fractures.

## 2024-01-29 NOTE — Progress Notes (Signed)
 [facility] PROGRESS NOTE    Progress Note  Kathleen [name]  ZOX:[id number] DOB: 08/22/1961 DOA: 01/25/2024 PCP: Bennet Brasil, MD     Brief Narrative:   Kathleen [name] is an 63 y.o. female past medical history of interstitial lung disease, sarcoidosis, chronic respiratory failure with hypoxia on 5 L of oxygen  morbid obesity Addison's disease bilateral hip avascular necrosis lumbar back pain and lumbar compression fracture comes back to the hospital with worsening lumbar and pelvic pain after recently being discharged on 01/23/2024 for L1 compression fracture she followed up with Dr. [name] after recent hospitalization, as an outpatient Dr. [name] was scheduling her for kyphoplasty after pulmonary clearance, but returns due to intractable back pain she was found to have acute left pubic symphysis and right sacral alar fracture initially admitted to [facility] after discussion with IR was transferred to [facility] for L3 and L4 kyphoplasty.  IR recommended kyphoplasty after clearing from anesthesia team at [facility].Patient has a follow-up appointment with endocrinologist in 2 months.   Assessment/Plan:   Intractable acute on chronic Lower back pain due to osteoporotic fracture involving L1, L3 and L4/acute left pubic symphysis and right sacral alar fracture: Bone densitometry on 02/23/2020 showed T-score of -2.3, she is on chronic steroids for ADL. Interventional radiology has approved for L3-L4 kyphoplasty pending clearance from anesthesia team. Continue narcotics for pain control. TLSO brace as needed. Continue bowel regimen. We have had a long discussion about DNR/DNI she does not want to be intubated or resuscitated.  Sarcoidosis/interstitial lung disease (HCC)/  Chronic respiratory failure with hypoxia (HCC): On 3 L of oxygen  and prednisone  5. At risk of CO2 retention. Just try to keep her oxygen  saturations above 86%.  Left femoral head avascular necrosis not a surgical  candidate: Continue supportive care.  E. coli UTI: To complete a 3-day course of IV Rocephin .  Addison's disease: Continue hydrocortisone .  Chronic hypotension: Continue midodrine .  Hyperlipidemia: Continue statins B  Hypothyroidism: Continue Synthroid .  Urinary retention: Continue Flomax .  Normocytic anemia: Follow-up PCP as an outpatient.  Acute on chronic pain syndrome: Continue current regimen.  Anxiety and depression: Continue buspirone  Cymbalta  and Atarax .  Morbid obesity: She has been counseled. Estimated body mass index is 39.75 kg/m as calculated from the following:   Height as of this encounter: 5\' 10"  (1.778 m).   Weight as of this encounter: 125.6 kg.  DVT prophylaxis: lovenox  Family Communication:none  Status is: Inpatient Remains inpatient appropriate because: Acute compression fracture    Code Status:     Code Status Orders  (From admission, onward)           Start     Ordered   01/26/24 0943  Full code  Continuous       Question:  By:  Answer:  Consent: discussion documented in EHR   01/26/24 0943           Code Status History     Date Active Date Inactive Code Status Order ID Comments User Context   01/26/2024 0425 01/26/2024 0943 Full Code [id number]  Twilla Galea, DO Inpatient   01/21/2024 0201 01/23/2024 2143 Full Code [id number]  Arnulfo Larch, MD ED   01/03/2024 2051 01/08/2024 1946 Full Code [id number]  Elgergawy, Ardia Kraft, MD ED   12/07/2023 2047 12/11/2023 1800 Full Code [id number]  Pati Bonine, MD ED   04/07/2023 0301 04/09/2023 2117 Full Code [id number]  Walton Guppy, MD ED   03/10/2023 1256 03/14/2023 2115 Full Code [id number]  Demaris Fillers, MD ED   12/12/2022 2147 12/15/2022 1706 Full Code [id number]  Pati Bonine, MD Inpatient   05/22/2021 0009 05/22/2021 2101 Full Code [id number]  Twilla Galea, DO ED   01/27/2020 0334 01/28/2020 1527 Full Code [id number]  Ozell Blunt, MD ED   11/04/2017 2153 11/06/2017 1614 Full  Code [id number]  Ozell Blunt, MD Inpatient   10/21/2017 2047 10/22/2017 1631 Full Code [id number]  Vanita Gens, MD Inpatient   10/16/2017 0108 10/18/2017 1710 Full Code [id number]  Danice Dural, MD ED   08/17/2016 1917 08/22/2016 0402 Full Code [id number]  Malka Sea, DO Inpatient   12/28/2015 1624 01/02/2016 2154 Full Code [id number]  Lena Qualia, MD ED   01/16/2013 2209 01/21/2013 1520 Full Code [id number]  Maylene Spear, MD Inpatient         IV Access:   Peripheral IV   Procedures and diagnostic studies:   No results found.   Medical Consultants:   None.   Subjective:    Kathleen [name] relates her pain is not controlled.  Objective:    Vitals:   01/28/24 2016 01/29/24 0431 01/29/24 0831 01/29/24 0902  BP: (!) 114/56 (!) 130/59 (!) 126/47   Pulse: 94 93 92 91  Resp: 17 17 16    Temp: 97.8 F (36.6 C) 97.9 F (36.6 C) 98.1 F (36.7 C)   TempSrc:      SpO2: 92% 96% 98% 99%  Weight:      Height:       SpO2: 99 % O2 Flow Rate (L/min): 4 L/min FiO2 (%): 99 %   Intake/Output Summary (Last 24 hours) at 01/29/2024 0922 Last data filed at 01/29/2024 0300 Gross per 24 hour  Intake --  Output 1300 ml  Net -1300 ml   Filed Weights   01/25/24 1908  Weight: 125.6 kg    Exam: General exam: In no acute distress. Respiratory system: Good air movement and clear to auscultation. Cardiovascular system: S1 & S2 heard, RRR. No JVD. Gastrointestinal system: Abdomen is nondistended, soft and nontender.  Extremities: No pedal edema. Skin: No rashes, lesions or ulcers Psychiatry: Judgement and insight appear normal. Mood & affect appropriate.    Data Reviewed:    Labs: Basic Metabolic Panel: Recent Labs  Lab 01/24/24 1419 01/25/24 2128 01/26/24 0523 01/27/24 0436 01/29/24 0604  NA 140 141 141 141 137  K 4.9 3.9 3.7 3.9 3.8  CL 94* 98 100 103 94*  CO2 30* 34* 33* 29 34*  GLUCOSE 145* 128* 102* 117* 108*  BUN 8 8 11 8 9   CREATININE 0.77 0.80 0.72  0.81 0.86  CALCIUM  9.3 8.9 8.8* 8.8* 8.8*  MG  --   --  2.2  --  2.0  PHOS  --   --  3.9  --  4.1   GFR Estimated Creatinine Clearance: 99 mL/min (by C-G formula based on SCr of 0.86 mg/dL). Liver Function Tests: Recent Labs  Lab 01/24/24 1419 01/25/24 2128 01/26/24 0523 01/29/24 0604  AST 20 18 17   --   ALT 23 25 23   --   ALKPHOS 129* 96 92  --   BILITOT 0.2 0.5 0.4  --   PROT 5.8* 5.4* 5.5*  --   ALBUMIN 3.8* 2.9* 2.8* 2.6*   No results for input(s): "LIPASE", "AMYLASE" in the last 168 hours. No results for input(s): "AMMONIA " in the last 168 hours. Coagulation profile No results for input(s): "INR", "PROTIME" in the last 168 hours. COVID-19 Labs  Recent Labs    01/26/24 1106  FERRITIN 19    Lab Results  Component Value Date   SARSCOV2NAA NEGATIVE 08/05/2023   SARSCOV2NAA NEGATIVE 05/20/2023   SARSCOV2NAA NEGATIVE 04/06/2023   SARSCOV2NAA NEGATIVE 03/10/2023    CBC: Recent Labs  Lab 01/25/24 2128 01/26/24 0523 01/27/24 0436 01/29/24 0604  WBC 9.1 9.0 9.7 9.3  NEUTROABS 6.8  --   --   --   HGB 9.1* 8.8* 9.1* 8.3*  HCT 30.2* 29.4* 31.0* 27.3*  MCV 98.1 98.0 99.0 96.5  PLT 415* 423* 388 391   Cardiac Enzymes: No results for input(s): "CKTOTAL", "CKMB", "CKMBINDEX", "TROPONINI" in the last 168 hours. BNP (last 3 results) No results for input(s): "PROBNP" in the last 8760 hours. CBG: No results for input(s): "GLUCAP" in the last 168 hours. D-Dimer: No results for input(s): "DDIMER" in the last 72 hours. Hgb A1c: No results for input(s): "HGBA1C" in the last 72 hours. Lipid Profile: No results for input(s): "CHOL", "HDL", "LDLCALC", "TRIG", "CHOLHDL", "LDLDIRECT" in the last 72 hours. Thyroid  function studies: No results for input(s): "TSH", "T4TOTAL", "T3FREE", "THYROIDAB" in the last 72 hours.  Invalid input(s): "FREET3" Anemia work up: Recent Labs    01/26/24 1106  VITAMINB12 429  FOLATE 9.2  FERRITIN 19  TIBC 308  IRON 39  RETICCTPCT 3.0    Sepsis Labs: Recent Labs  Lab 01/25/24 2128 01/26/24 0523 01/27/24 0436 01/29/24 0604  WBC 9.1 9.0 9.7 9.3   Microbiology Recent Results (from the past 240 hours)  Urine Culture     Status: Abnormal   Collection Time: 01/25/24 11:08 PM   Specimen: Urine, Clean Catch  Result Value Ref Range Status   Specimen Description   Final    URINE, CLEAN CATCH Performed at [facility], [address] [place], [place] [address]    Special Requests   Final    NONE Performed at [facility], [address], [place], [place] [address]    Culture >=100,000 COLONIES/mL ESCHERICHIA COLI (A)  Final   Report Status 01/28/2024 FINAL  Final   Organism ID, Bacteria ESCHERICHIA COLI (A)  Final      Susceptibility   Escherichia coli - MIC*    AMPICILLIN 8 SENSITIVE Sensitive     CEFAZOLIN  <=4 SENSITIVE Sensitive     CEFEPIME  <=0.12 SENSITIVE Sensitive     CEFTRIAXONE  <=0.25 SENSITIVE Sensitive     CIPROFLOXACIN  <=0.25 SENSITIVE Sensitive     GENTAMICIN <=1 SENSITIVE Sensitive     IMIPENEM <=0.25 SENSITIVE Sensitive     NITROFURANTOIN <=16 SENSITIVE Sensitive     TRIMETH /SULFA  <=20 SENSITIVE Sensitive     AMPICILLIN/SULBACTAM <=2 SENSITIVE Sensitive     PIP/TAZO <=4 SENSITIVE Sensitive ug/mL    * >=100,000 COLONIES/mL ESCHERICHIA COLI     Medications:    alendronate   70 mg Oral QAC breakfast   arformoterol   15 mcg Nebulization BID   budesonide   0.5 mg Nebulization BID   busPIRone   15 mg Oral TID   Chlorhexidine  Gluconate Cloth  6 each Topical Daily   cyclobenzaprine   5 mg Oral TID   DULoxetine   60 mg Oral Daily   enoxaparin  (LOVENOX ) injection  60 mg Subcutaneous Q24H   feeding supplement  237 mL Oral BID BM   ferrous sulfate   325 mg Oral Weekly   furosemide   20 mg Oral Q M,W,F   hydrocortisone   10 mg Oral Q lunch   hydrocortisone   20 mg Oral Daily   levothyroxine   50 mcg Oral QAC  breakfast   lidocaine   1 patch Transdermal Q24H   linaclotide   72 mcg Oral QAC breakfast    midodrine   5 mg Oral BID WC   multivitamin with minerals  1 tablet Oral Daily   pantoprazole   40 mg Oral Daily   polyethylene glycol  17 g Oral BID   predniSONE   5 mg Oral Q breakfast   prochlorperazine   10 mg Intravenous Once   revefenacin  175 mcg Nebulization Daily   rosuvastatin   20 mg Oral Daily   senna-docusate  2 tablet Oral QHS   sodium chloride  flush  3 mL Intravenous Q12H   sodium phosphate   1 enema Rectal Once   tamsulosin   0.4 mg Oral QPC supper   Vitamin D  (Ergocalciferol )  50,000 Units Oral Q Sun   Continuous Infusions:    LOS: 3 days   Macdonald [facility]  [facility]  01/29/2024, 9:22 AM

## 2024-01-30 DIAGNOSIS — S32030A Wedge compression fracture of third lumbar vertebra, initial encounter for closed fracture: Secondary | ICD-10-CM | POA: Diagnosis not present

## 2024-01-30 DIAGNOSIS — S32040A Wedge compression fracture of fourth lumbar vertebra, initial encounter for closed fracture: Secondary | ICD-10-CM | POA: Diagnosis not present

## 2024-01-30 NOTE — Progress Notes (Signed)
 Orthopedic Tech Progress Note Patient Details:  Kathleen [name] 06/23/1961 [id number]  Ortho Devices Type of Ortho Device: Thoracolumbar corset (TLSO) Ortho Device/Splint Location: Back Ortho Device/Splint Interventions: Ordered, Application, Adjustment   Post Interventions Patient Tolerated: Kathleen [name] 01/30/2024, 2:48 PM

## 2024-01-30 NOTE — Progress Notes (Addendum)
 [facility] PROGRESS NOTE    Progress Note  [name]  YNW:[id number] DOB: 25-Nov-1960 DOA: 01/25/2024 PCP: Bennet Brasil, MD     Brief Narrative:   Kathleen [name] is an 63 y.o. female past medical history of interstitial lung disease, sarcoidosis, chronic respiratory failure with hypoxia on 5 L of oxygen  morbid obesity Addison's disease bilateral hip avascular necrosis lumbar back pain and lumbar compression fracture comes back to the hospital with worsening lumbar and pelvic pain after recently being discharged on 01/23/2024 for L1 compression fracture she followed up with Dr. [name] after recent hospitalization, as an outpatient Dr. [name] was scheduling her for kyphoplasty after pulmonary clearance, but returns due to intractable back pain she was found to have acute left pubic symphysis and right sacral alar fracture initially admitted to [facility] after discussion with IR was transferred to [facility] for L3 and L4 kyphoplasty.  IR recommended kyphoplasty after clearing from anesthesia team at [facility].Patient has a follow-up appointment with endocrinologist in 2 months.   Assessment/Plan:   Intractable acute on chronic Lower back pain due to osteoporotic fracture involving L1, L3 and L4/acute left pubic symphysis and right sacral alar fracture: Bone densitometry on 02/23/2020 showed T-score of -2.3, she is on chronic steroids for IDL. Interventional radiology discussed with family at length and the family decided to hold on the kyphoplasty due to the high risk of intubation and continue conservative management with narcotics and TLSO. Continue narcotics for pain control. Continue bowel regimen. We have had a long discussion about DNR/DNI she does not want to be intubated or resuscitated. PT evaluated the patient will need home health PT Consult TOC for equipment.  Sarcoidosis/interstitial lung disease (HCC)/  Chronic respiratory failure with hypoxia (HCC): On 3 L  of oxygen  and prednisone  5. At risk of CO2 retention.   Left femoral head avascular necrosis not a surgical candidate: Continue supportive care.  E. coli UTI: To complete a 3-day course of IV Rocephin .  Addison's disease: Continue hydrocortisone .  Chronic hypotension: Continue midodrine .  Hyperlipidemia: Continue statins B  Hypothyroidism: Continue Synthroid .  Urinary retention: Continue Flomax .  Normocytic anemia: Follow-up PCP as an outpatient.  Acute on chronic pain syndrome: Continue current regimen.  Anxiety and depression: Continue buspirone  Cymbalta  and Atarax .  Morbid obesity: She has been counseled. Estimated body mass index is 39.75 kg/m as calculated from the following:   Height as of this encounter: 5\' 10"  (1.778 m).   Weight as of this encounter: 125.6 kg.  DVT prophylaxis: lovenox  Family Communication:none  Status is: Inpatient Remains inpatient appropriate because: Acute compression fracture    Code Status:     Code Status Orders  (From admission, onward)           Start     Ordered   01/26/24 0943  Full code  Continuous       Question:  By:  Answer:  Consent: discussion documented in EHR   01/26/24 0943           Code Status History     Date Active Date Inactive Code Status Order ID Comments User Context   01/26/2024 0425 01/26/2024 0943 Full Code [id number]  Twilla Galea, DO Inpatient   01/21/2024 0201 01/23/2024 2143 Full Code [id number]  Arnulfo Larch, MD ED   01/03/2024 2051 01/08/2024 1946 Full Code [id number]  Elgergawy, Ardia Kraft, MD ED   12/07/2023 2047 12/11/2023 1800 Full Code [id number]  Pati Bonine, MD ED   04/07/2023 0301  04/09/2023 2117 Full Code [id number]  Walton Guppy, MD ED   03/10/2023 1256 03/14/2023 2115 Full Code [id number]  Demaris Fillers, MD ED   12/12/2022 2147 12/15/2022 1706 Full Code [id number]  Pati Bonine, MD Inpatient   05/22/2021 0009 05/22/2021 2101 Full Code [id number]  Twilla Galea, DO  ED   01/27/2020 0334 01/28/2020 1527 Full Code [id number]  Ozell Blunt, MD ED   11/04/2017 2153 11/06/2017 1614 Full Code [id number]  Ozell Blunt, MD Inpatient   10/21/2017 2047 10/22/2017 1631 Full Code [id number]  Vanita Gens, MD Inpatient   10/16/2017 0108 10/18/2017 1710 Full Code [id number]  Danice Dural, MD ED   08/17/2016 1917 08/22/2016 0402 Full Code [id number]  Malka Sea, DO Inpatient   12/28/2015 1624 01/02/2016 2154 Full Code [id number]  Lena Qualia, MD ED   01/16/2013 2209 01/21/2013 1520 Full Code [id number]  Maylene Spear, MD Inpatient         IV Access:   Peripheral IV   Procedures and diagnostic studies:   No results found.   Medical Consultants:   None.   Subjective:    Daija R Friebel pain is controlled had a bowel movement.  Objective:    Vitals:   01/29/24 2042 01/29/24 2059 01/30/24 0528 01/30/24 0847  BP: 134/76  101/78 (!) 105/32  Pulse: 92  92 90  Resp: 19  19 20   Temp: 98.5 F (36.9 C)  97.8 F (36.6 C) 98.2 F (36.8 C)  TempSrc: Axillary  Oral   SpO2: 100% 100% 99% 96%  Weight:      Height:       SpO2: 96 % O2 Flow Rate (L/min): 4 L/min FiO2 (%): 99 %   Intake/Output Summary (Last 24 hours) at 01/30/2024 0909 Last data filed at 01/30/2024 0100 Gross per 24 hour  Intake --  Output 500 ml  Net -500 ml   Filed Weights   01/25/24 1908  Weight: 125.6 kg    Exam: General exam: In no acute distress. Respiratory system: Good air movement and clear to auscultation. Cardiovascular system: S1 & S2 heard, RRR. No JVD. Gastrointestinal system: Abdomen is nondistended, soft and nontender.  Extremities: No pedal edema. Skin: No rashes, lesions or ulcers Psychiatry: Judgement and insight appear normal. Mood & affect appropriate.  Data Reviewed:    Labs: Basic Metabolic Panel: Recent Labs  Lab 01/24/24 1419 01/25/24 2128 01/26/24 0523 01/27/24 0436 01/29/24 0604  NA 140 141 141 141 137  K 4.9 3.9 3.7 3.9 3.8  CL 94* 98  100 103 94*  CO2 30* 34* 33* 29 34*  GLUCOSE 145* 128* 102* 117* 108*  BUN 8 8 11 8 9   CREATININE 0.77 0.80 0.72 0.81 0.86  CALCIUM  9.3 8.9 8.8* 8.8* 8.8*  MG  --   --  2.2  --  2.0  PHOS  --   --  3.9  --  4.1   GFR Estimated Creatinine Clearance: 99 mL/min (by C-G formula based on SCr of 0.86 mg/dL). Liver Function Tests: Recent Labs  Lab 01/24/24 1419 01/25/24 2128 01/26/24 0523 01/29/24 0604  AST 20 18 17   --   ALT 23 25 23   --   ALKPHOS 129* 96 92  --   BILITOT 0.2 0.5 0.4  --   PROT 5.8* 5.4* 5.5*  --   ALBUMIN 3.8* 2.9* 2.8* 2.6*   No results for input(s): "LIPASE", "AMYLASE" in the last 168 hours. No results for input(s): "AMMONIA " in the  last 168 hours. Coagulation profile No results for input(s): "INR", "PROTIME" in the last 168 hours. COVID-19 Labs  No results for input(s): "DDIMER", "FERRITIN", "LDH", "CRP" in the last 72 hours.   Lab Results  Component Value Date   SARSCOV2NAA NEGATIVE 08/05/2023   SARSCOV2NAA NEGATIVE 05/20/2023   SARSCOV2NAA NEGATIVE 04/06/2023   SARSCOV2NAA NEGATIVE 03/10/2023    CBC: Recent Labs  Lab 01/25/24 2128 01/26/24 0523 01/27/24 0436 01/29/24 0604  WBC 9.1 9.0 9.7 9.3  NEUTROABS 6.8  --   --   --   HGB 9.1* 8.8* 9.1* 8.3*  HCT 30.2* 29.4* 31.0* 27.3*  MCV 98.1 98.0 99.0 96.5  PLT 415* 423* 388 391   Cardiac Enzymes: No results for input(s): "CKTOTAL", "CKMB", "CKMBINDEX", "TROPONINI" in the last 168 hours. BNP (last 3 results) No results for input(s): "PROBNP" in the last 8760 hours. CBG: No results for input(s): "GLUCAP" in the last 168 hours. D-Dimer: No results for input(s): "DDIMER" in the last 72 hours. Hgb A1c: No results for input(s): "HGBA1C" in the last 72 hours. Lipid Profile: No results for input(s): "CHOL", "HDL", "LDLCALC", "TRIG", "CHOLHDL", "LDLDIRECT" in the last 72 hours. Thyroid  function studies: No results for input(s): "TSH", "T4TOTAL", "T3FREE", "THYROIDAB" in the last 72  hours.  Invalid input(s): "FREET3" Anemia work up: No results for input(s): "VITAMINB12", "FOLATE", "FERRITIN", "TIBC", "IRON", "RETICCTPCT" in the last 72 hours.  Sepsis Labs: Recent Labs  Lab 01/25/24 2128 01/26/24 0523 01/27/24 0436 01/29/24 0604  WBC 9.1 9.0 9.7 9.3   Microbiology Recent Results (from the past 240 hours)  Urine Culture     Status: Abnormal   Collection Time: 01/25/24 11:08 PM   Specimen: Urine, Clean Catch  Result Value Ref Range Status   Specimen Description   Final    URINE, CLEAN CATCH Performed at [facility], [address], [place], [place] [address]    Special Requests   Final    NONE Performed at [facility], [address], [place], [place] [address]    Culture >=100,000 COLONIES/mL ESCHERICHIA COLI (A)  Final   Report Status 01/28/2024 FINAL  Final   Organism ID, Bacteria ESCHERICHIA COLI (A)  Final      Susceptibility   Escherichia coli - MIC*    AMPICILLIN 8 SENSITIVE Sensitive     CEFAZOLIN  <=4 SENSITIVE Sensitive     CEFEPIME  <=0.12 SENSITIVE Sensitive     CEFTRIAXONE  <=0.25 SENSITIVE Sensitive     CIPROFLOXACIN  <=0.25 SENSITIVE Sensitive     GENTAMICIN <=1 SENSITIVE Sensitive     IMIPENEM <=0.25 SENSITIVE Sensitive     NITROFURANTOIN <=16 SENSITIVE Sensitive     TRIMETH /SULFA  <=20 SENSITIVE Sensitive     AMPICILLIN/SULBACTAM <=2 SENSITIVE Sensitive     PIP/TAZO <=4 SENSITIVE Sensitive ug/mL    * >=100,000 COLONIES/mL ESCHERICHIA COLI     Medications:    alendronate   70 mg Oral QAC breakfast   arformoterol   15 mcg Nebulization BID   budesonide   0.5 mg Nebulization BID   busPIRone   15 mg Oral TID   Chlorhexidine  Gluconate Cloth  6 each Topical Daily   cyclobenzaprine   5 mg Oral TID   DULoxetine   60 mg Oral Daily   enoxaparin  (LOVENOX ) injection  60 mg Subcutaneous Q24H   feeding supplement  237 mL Oral BID BM   ferrous sulfate   325 mg Oral Weekly   furosemide   20 mg Oral Q M,W,F   hydrocortisone   10 mg Oral Q lunch    hydrocortisone   20  mg Oral Daily   levothyroxine   50 mcg Oral QAC breakfast   lidocaine   1 patch Transdermal Q24H   linaclotide   72 mcg Oral QAC breakfast   midodrine   5 mg Oral BID WC   pantoprazole   40 mg Oral Daily   polyethylene glycol  17 g Oral BID   predniSONE   5 mg Oral Q breakfast   prochlorperazine   10 mg Intravenous Once   revefenacin  175 mcg Nebulization Daily   rosuvastatin   20 mg Oral Daily   senna-docusate  2 tablet Oral QHS   tamsulosin   0.4 mg Oral QPC supper   Vitamin D  (Ergocalciferol )  50,000 Units Oral Q Sun   Continuous Infusions:    LOS: 4 days   Macdonald Savoy  [facility]  01/30/2024, 9:09 AM

## 2024-01-30 NOTE — TOC Progression Note (Signed)
 Transition of Care [facility]) - Progression Note    Patient Details  Name: Kathleen [name] MRN: [id number] Date of Birth: 1961/06/18  Transition of Care [facility]) CM/SW Contact  Jonathan Neighbor, RN Phone Number: 01/30/2024, 3:19 PM  Clinical Narrative:     Per IR kyphoplasty is on hold.  TOC following.  Expected Discharge Plan: Home w Home Health Services Barriers to Discharge: Continued Medical Work up  Expected Discharge Plan and Services In-house Referral: Clinical Social Work Discharge Planning Services: CM Consult Post Acute Care Choice: Durable Medical Equipment, Home Health Living arrangements for the past 2 months: Single Family Home                           HH Arranged: RN, PT, OT HH Agency: [facility] Date [facility] Agency Contacted: 01/27/24 Time HH Agency Contacted: 1201 Representative spoke with at [facility] Agency: Randel Buss   Social Determinants of Health (SDOH) Interventions SDOH Screenings   Food Insecurity: No Food Insecurity (01/26/2024)  Housing: Low Risk  (01/26/2024)  Transportation Needs: No Transportation Needs (01/26/2024)  Utilities: Not At Risk (01/26/2024)  Alcohol Screen: Low Risk  (09/27/2023)  Depression (PHQ2-9): Low Risk  (01/01/2024)  Financial Resource Strain: Low Risk  (09/27/2023)  Physical Activity: Inactive (09/27/2023)  Social Connections: Socially Integrated (01/21/2024)  Stress: No Stress Concern Present (09/27/2023)  Tobacco Use: Low Risk  (01/25/2024)  Health Literacy: Medium Risk (12/20/2023)   Received from [facility]    Readmission Risk Interventions    01/27/2024   11:48 AM 12/11/2023   11:11 AM 04/08/2023   10:38 AM  Readmission Risk Prevention Plan  Transportation Screening Complete Complete Complete  HRI or Home Care Consult  Complete   Social Work Consult for Recovery Care Planning/Counseling  Complete   Palliative Care Screening  Not Applicable   Medication Review Oceanographer) Complete Complete Complete  HRI or Home Care  Consult Complete  Complete  SW Recovery Care/Counseling Consult Complete  Complete  Palliative Care Screening Not Applicable  Not Applicable  Skilled Nursing Facility Patient Refused  Not Applicable

## 2024-01-31 ENCOUNTER — Other Ambulatory Visit (HOSPITAL_BASED_OUTPATIENT_CLINIC_OR_DEPARTMENT_OTHER): Payer: Self-pay

## 2024-01-31 ENCOUNTER — Encounter (HOSPITAL_COMMUNITY)
Admission: EM | Disposition: A | Discharge status: Rehabilitation Facility | Payer: Self-pay | Source: Home / Self Care | Attending: Internal Medicine

## 2024-01-31 ENCOUNTER — Encounter: Payer: Self-pay | Admitting: "Endocrinology

## 2024-01-31 DIAGNOSIS — S32030A Wedge compression fracture of third lumbar vertebra, initial encounter for closed fracture: Secondary | ICD-10-CM | POA: Diagnosis not present

## 2024-01-31 DIAGNOSIS — S32040A Wedge compression fracture of fourth lumbar vertebra, initial encounter for closed fracture: Secondary | ICD-10-CM | POA: Diagnosis not present

## 2024-01-31 SURGERY — RADIOLOGY WITH ANESTHESIA
Anesthesia: Choice

## 2024-01-31 MED ORDER — POLYETHYLENE GLYCOL 3350 17 G PO PACK
17.0000 g | PACK | Freq: Two times a day (BID) | ORAL | Status: AC
  Administered 2024-01-31 – 2024-02-01 (×3): 17 g via ORAL
  Filled 2024-01-31 (×4): qty 1

## 2024-01-31 NOTE — Progress Notes (Signed)
 [facility] PROGRESS NOTE    Progress Note  Kathleen [name]  AOZ:[id number] DOB: 08-28-1960 DOA: 01/25/2024 PCP: Bennet Brasil, MD     Brief Narrative:   Kathleen [name] is an 63 y.o. female past medical history of interstitial lung disease, fsarcoidosis, chronic respiratory failure with hypoxia on 5 L of oxygen  morbid obesity Addison's disease bilateral hip avascular necrosis lumbar back pain and lumbar compression fracture comes back to the hospital with worsening lumbar and pelvic pain after recently being discharged on 01/23/2024 for L1 compression fracture she followed up with Dr. [name] after recent hospitalization, as an outpatient Dr. [name] was scheduling her for kyphoplasty after pulmonary clearance, but returns due to intractable back pain she was found to have acute left pubic symphysis and right sacral alar fracture initially admitted to [facility] after discussion with IR was transferred to [facility] for L3 and L4 kyphoplasty.  IR recommended kyphoplasty after clearing from anesthesia team at [facility].Patient has a follow-up appointment with endocrinologist in 2 months.   Assessment/Plan:   Intractable acute on chronic Lower back pain due to osteoporotic fracture involving L1, L3 and L4/acute left pubic symphysis and right sacral alar fracture: Interventional radiology discussed with family patient does not want to go through with this kyphoplasty. Continue conservative management with narcotics and TLSO. Continue bowel regimen. We have had a long discussion about DNR/DNI she does not want to be intubated or resuscitated. PT evaluated the patient will need home health PT Consult TOC for equipment. Pain is controlled having regular bowel movements. Hopefully home tomorrow morning.  Sarcoidosis/interstitial lung disease (HCC)/  Chronic respiratory failure with hypoxia (HCC): On 3 L of oxygen  and prednisone  5. At risk of CO2 retention.  Left femoral head  avascular necrosis not a surgical candidate: Continue supportive care.  E. coli UTI: To complete a 3-day course of IV Rocephin .  Addison's disease: Continue hydrocortisone .  Chronic hypotension: Continue midodrine .  Hyperlipidemia: Continue statins B  Hypothyroidism: Continue Synthroid .  Urinary retention: Continue Flomax .  Normocytic anemia: Follow-up PCP as an outpatient.  Acute on chronic pain syndrome: Continue current regimen.  Anxiety and depression: Continue buspirone  Cymbalta  and Atarax .  Morbid obesity: She has been counseled. Estimated body mass index is 39.75 kg/m as calculated from the following:   Height as of this encounter: 5\' 10"  (1.778 m).   Weight as of this encounter: 125.6 kg.  DVT prophylaxis: lovenox  Family Communication:none  Status is: Inpatient Remains inpatient appropriate because: Acute compression fracture    Code Status:     Code Status Orders  (From admission, onward)           Start     Ordered   01/26/24 0943  Full code  Continuous       Question:  By:  Answer:  Consent: discussion documented in EHR   01/26/24 0943           Code Status History     Date Active Date Inactive Code Status Order ID Comments User Context   01/26/2024 0425 01/26/2024 0943 Full Code [id number]  Twilla Galea, DO Inpatient   01/21/2024 0201 01/23/2024 2143 Full Code [id number]  Arnulfo Larch, MD ED   01/03/2024 2051 01/08/2024 1946 Full Code [id number]  Elgergawy, Ardia Kraft, MD ED   12/07/2023 2047 12/11/2023 1800 Full Code [id number]  Pati Bonine, MD ED   04/07/2023 0301 04/09/2023 2117 Full Code [id number]  Walton Guppy, MD ED   03/10/2023 1256 03/14/2023 2115 Full Code  [id number]  Demaris Fillers, MD ED   12/12/2022 2147 12/15/2022 1706 Full Code [id number]  Pati Bonine, MD Inpatient   05/22/2021 0009 05/22/2021 2101 Full Code [id number]  Twilla Galea, DO ED   01/27/2020 0334 01/28/2020 1527 Full Code [id number]  Ozell Blunt, MD ED    11/04/2017 2153 11/06/2017 1614 Full Code [id number]  Ozell Blunt, MD Inpatient   10/21/2017 2047 10/22/2017 1631 Full Code [id number]  Vanita Gens, MD Inpatient   10/16/2017 0108 10/18/2017 1710 Full Code [id number]  Danice Dural, MD ED   08/17/2016 1917 08/22/2016 0402 Full Code [id number]  Malka Sea, DO Inpatient   12/28/2015 1624 01/02/2016 2154 Full Code [id number]  Lena Qualia, MD ED   01/16/2013 2209 01/21/2013 1520 Full Code [id number]  Maylene Spear, MD Inpatient         IV Access:   Peripheral IV   Procedures and diagnostic studies:   No results found.   Medical Consultants:   None.   Subjective:    Kathleen [name] relates pain is controlled.  Objective:    Vitals:   01/30/24 2045 01/31/24 0455 01/31/24 0747 01/31/24 0807  BP:  (!) 147/67 (!) 124/59   Pulse:  88 94 88  Resp:  18 18 16   Temp:  97.8 F (36.6 C) 98 F (36.7 C)   TempSrc:  Oral Oral   SpO2: 94% 95% 96% 96%  Weight:      Height:       SpO2: 96 % O2 Flow Rate (L/min): 5 L/min FiO2 (%): 99 %   Intake/Output Summary (Last 24 hours) at 01/31/2024 1033 Last data filed at 01/30/2024 1533 Gross per 24 hour  Intake 360 ml  Output --  Net 360 ml   Filed Weights   01/25/24 1908  Weight: 125.6 kg    Exam: General exam: In no acute distress. Respiratory system: Good air movement and clear to auscultation. Cardiovascular system: S1 & S2 heard, RRR. No JVD. Gastrointestinal system: Abdomen is nondistended, soft and nontender.  Extremities: No pedal edema. Skin: No rashes, lesions or ulcers Psychiatry: Judgement and insight appear normal. Mood & affect appropriate.  Data Reviewed:    Labs: Basic Metabolic Panel: Recent Labs  Lab 01/24/24 1419 01/25/24 2128 01/26/24 0523 01/27/24 0436 01/29/24 0604  NA 140 141 141 141 137  K 4.9 3.9 3.7 3.9 3.8  CL 94* 98 100 103 94*  CO2 30* 34* 33* 29 34*  GLUCOSE 145* 128* 102* 117* 108*  BUN 8 8 11 8 9   CREATININE 0.77 0.80 0.72 0.81  0.86  CALCIUM  9.3 8.9 8.8* 8.8* 8.8*  MG  --   --  2.2  --  2.0  PHOS  --   --  3.9  --  4.1   GFR Estimated Creatinine Clearance: 99 mL/min (by C-G formula based on SCr of 0.86 mg/dL). Liver Function Tests: Recent Labs  Lab 01/24/24 1419 01/25/24 2128 01/26/24 0523 01/29/24 0604  AST 20 18 17   --   ALT 23 25 23   --   ALKPHOS 129* 96 92  --   BILITOT 0.2 0.5 0.4  --   PROT 5.8* 5.4* 5.5*  --   ALBUMIN 3.8* 2.9* 2.8* 2.6*   No results for input(s): "LIPASE", "AMYLASE" in the last 168 hours. No results for input(s): "AMMONIA " in the last 168 hours. Coagulation profile No results for input(s): "INR", "PROTIME" in the last 168 hours. COVID-19 Labs  No results for input(s): "DDIMER", "  FERRITIN", "LDH", "CRP" in the last 72 hours.   Lab Results  Component Value Date   SARSCOV2NAA NEGATIVE 08/05/2023   SARSCOV2NAA NEGATIVE 05/20/2023   SARSCOV2NAA NEGATIVE 04/06/2023   SARSCOV2NAA NEGATIVE 03/10/2023    CBC: Recent Labs  Lab 01/25/24 2128 01/26/24 0523 01/27/24 0436 01/29/24 0604  WBC 9.1 9.0 9.7 9.3  NEUTROABS 6.8  --   --   --   HGB 9.1* 8.8* 9.1* 8.3*  HCT 30.2* 29.4* 31.0* 27.3*  MCV 98.1 98.0 99.0 96.5  PLT 415* 423* 388 391   Cardiac Enzymes: No results for input(s): "CKTOTAL", "CKMB", "CKMBINDEX", "TROPONINI" in the last 168 hours. BNP (last 3 results) No results for input(s): "PROBNP" in the last 8760 hours. CBG: No results for input(s): "GLUCAP" in the last 168 hours. D-Dimer: No results for input(s): "DDIMER" in the last 72 hours. Hgb A1c: No results for input(s): "HGBA1C" in the last 72 hours. Lipid Profile: No results for input(s): "CHOL", "HDL", "LDLCALC", "TRIG", "CHOLHDL", "LDLDIRECT" in the last 72 hours. Thyroid  function studies: No results for input(s): "TSH", "T4TOTAL", "T3FREE", "THYROIDAB" in the last 72 hours.  Invalid input(s): "FREET3" Anemia work up: No results for input(s): "VITAMINB12", "FOLATE", "FERRITIN", "TIBC", "IRON",  "RETICCTPCT" in the last 72 hours.  Sepsis Labs: Recent Labs  Lab 01/25/24 2128 01/26/24 0523 01/27/24 0436 01/29/24 0604  WBC 9.1 9.0 9.7 9.3   Microbiology Recent Results (from the past 240 hours)  Urine Culture     Status: Abnormal   Collection Time: 01/25/24 11:08 PM   Specimen: Urine, Clean Catch  Result Value Ref Range Status   Specimen Description   Final    URINE, CLEAN CATCH Performed at [facility], [address] [place], [place] [address]    Special Requests   Final    NONE Performed at [facility], [address] [place], [place] [address]    Culture >=100,000 COLONIES/mL ESCHERICHIA COLI (A)  Final   Report Status 01/28/2024 FINAL  Final   Organism ID, Bacteria ESCHERICHIA COLI (A)  Final      Susceptibility   Escherichia coli - MIC*    AMPICILLIN 8 SENSITIVE Sensitive     CEFAZOLIN  <=4 SENSITIVE Sensitive     CEFEPIME  <=0.12 SENSITIVE Sensitive     CEFTRIAXONE  <=0.25 SENSITIVE Sensitive     CIPROFLOXACIN  <=0.25 SENSITIVE Sensitive     GENTAMICIN <=1 SENSITIVE Sensitive     IMIPENEM <=0.25 SENSITIVE Sensitive     NITROFURANTOIN <=16 SENSITIVE Sensitive     TRIMETH /SULFA  <=20 SENSITIVE Sensitive     AMPICILLIN/SULBACTAM <=2 SENSITIVE Sensitive     PIP/TAZO <=4 SENSITIVE Sensitive ug/mL    * >=100,000 COLONIES/mL ESCHERICHIA COLI     Medications:    alendronate   70 mg Oral QAC breakfast   arformoterol   15 mcg Nebulization BID   budesonide   0.5 mg Nebulization BID   busPIRone   15 mg Oral TID   Chlorhexidine  Gluconate Cloth  6 each Topical Daily   cyclobenzaprine   5 mg Oral TID   DULoxetine   60 mg Oral Daily   enoxaparin  (LOVENOX ) injection  60 mg Subcutaneous Q24H   feeding supplement  237 mL Oral BID BM   ferrous sulfate   325 mg Oral Weekly   furosemide   20 mg Oral Q M,W,F   hydrocortisone   10 mg Oral Q lunch   hydrocortisone   20 mg Oral Daily   levothyroxine   50 mcg Oral QAC breakfast   lidocaine   1 patch Transdermal Q24H   linaclotide   72  mcg Oral QAC breakfast   midodrine   5 mg Oral BID WC   pantoprazole   40 mg Oral Daily   predniSONE   5 mg Oral Q breakfast   prochlorperazine   10 mg Intravenous Once   revefenacin  175 mcg Nebulization Daily   rosuvastatin   20 mg Oral Daily   senna-docusate  2 tablet Oral QHS   tamsulosin   0.4 mg Oral QPC supper   Vitamin D  (Ergocalciferol )  50,000 Units Oral Q Sun   Continuous Infusions:    LOS: 5 days   Macdonald Savoy  [facility]  01/31/2024, 10:33 AM

## 2024-01-31 NOTE — TOC Progression Note (Signed)
 Transition of Care [facility]) - Progression Note    Patient Details  Name: Kathleen [name] MRN: [id number] Date of Birth: 1960/11/04  Transition of Care [facility]) CM/SW Contact  Jonathan Neighbor, RN Phone Number: 01/31/2024, 1:57 PM  Clinical Narrative:     Plan is for d/c tomorrow. Pt is set up with [facility] for [facility]. PT to re-see to eval for potential SNF needs.  TOC following.  Expected Discharge Plan: Home w Home Health Services Barriers to Discharge: Continued Medical Work up  Expected Discharge Plan and Services In-house Referral: Clinical Social Work Discharge Planning Services: CM Consult Post Acute Care Choice: Durable Medical Equipment, Home Health Living arrangements for the past 2 months: Single Family Home                           HH Arranged: RN, PT, OT HH Agency: [facility] Date [facility] Agency Contacted: 01/27/24 Time HH Agency Contacted: 1201 Representative spoke with at [facility] Agency: Randel Buss   Social Determinants of Health (SDOH) Interventions SDOH Screenings   Food Insecurity: No Food Insecurity (01/26/2024)  Housing: Low Risk  (01/26/2024)  Transportation Needs: No Transportation Needs (01/26/2024)  Utilities: Not At Risk (01/26/2024)  Alcohol Screen: Low Risk  (09/27/2023)  Depression (PHQ2-9): Low Risk  (01/01/2024)  Financial Resource Strain: Low Risk  (09/27/2023)  Physical Activity: Inactive (09/27/2023)  Social Connections: Socially Integrated (01/21/2024)  Stress: No Stress Concern Present (09/27/2023)  Tobacco Use: Low Risk  (01/25/2024)  Health Literacy: Medium Risk (12/20/2023)   Received from [facility]    Readmission Risk Interventions    01/27/2024   11:48 AM 12/11/2023   11:11 AM 04/08/2023   10:38 AM  Readmission Risk Prevention Plan  Transportation Screening Complete Complete Complete  HRI or Home Care Consult  Complete   Social Work Consult for Recovery Care Planning/Counseling  Complete   Palliative Care Screening  Not Applicable   Medication  Review Oceanographer) Complete Complete Complete  HRI or Home Care Consult Complete  Complete  SW Recovery Care/Counseling Consult Complete  Complete  Palliative Care Screening Not Applicable  Not Applicable  Skilled Nursing Facility Patient Refused  Not Applicable

## 2024-01-31 NOTE — Progress Notes (Addendum)
 Physical Therapy Treatment Patient Details Name: Kathleen [name] MRN: [id number] DOB: 05/18/1961 Today's Date: 01/31/2024   History of Present Illness Kathleen [name] is a 63 y.o. female admitted with intractable back pain she was found to have acute left pubic symphysis and right sacral alar fracture initially admitted to [facility] after discussion with IR was transferred to [facility] for L3 and L4 kyphoplasty.  PMH of chronic hypoxic respiratory failure (5L O2), ILD, sarcoidosis, Addison's disease, morbid obesity, bilateral hip AVN, osteoporosis with multiple vertebral fractures on chronic narcotics, and recent admissions for intractable pain due to compression fractures of L4 (4/12-4/16) and then L3 (5/9-5/14), readmitted on 01/20/2024 with acute L1 fracture, discharged home on 01/23/2024    PT Comments  Pt received in supine and agreeable to session. Pt able to sit to EOB with min A and requests to use the BSC. Pt requires min A +2 to stand from EOB and BSC and complete 3 pivot transfers. Pt able to stand at EOB and perform marches, but is unable to progress gait this session due to pain. Pt reports TLSO brace helps improve pain slightly. Pt states she is very motivated to get better and return to functional independence. After discussion with supervising PT Zain B., discharge recommendations have been updated to intensive inpatient follow-up therapy, >3 hours/day to optimize pt progress towards functional independence prior to return home. Acutely, pt continues to benefit from PT services to progress toward functional mobility goals.    If plan is discharge home, recommend the following: A lot of help with walking and/or transfers;A lot of help with bathing/dressing/bathroom;Help with stairs or ramp for entrance;Assistance with cooking/housework   Can travel by private vehicle     No  Equipment Recommendations  None recommended by PT    Recommendations for Other Services       Precautions  / Restrictions Precautions Precautions: Fall Recall of Precautions/Restrictions: Intact Precaution/Restrictions Comments: LSO brace (as needed) Restrictions Weight Bearing Restrictions Per Provider Order: No     Mobility  Bed Mobility Overal bed mobility: Needs Assistance Bed Mobility: Sit to Supine, Rolling, Sidelying to Sit Rolling: Contact guard assist Sidelying to sit: Min assist, Used rails, HOB elevated   Sit to supine: Contact guard assist   General bed mobility comments: Min A for trunk elevation and cues for technique. Increased time and effort    Transfers Overall transfer level: Needs assistance Equipment used: Rolling walker (2 wheels) Transfers: Sit to/from Stand, Bed to chair/wheelchair/BSC Sit to Stand: +2 physical assistance, Min assist   Step pivot transfers: Min assist, +2 physical assistance, +2 safety/equipment       General transfer comment: STS from EOB and BSC with min A +2 for power up and anterior weight shift. Min A +2 for balance, RW management, and safety to pivot from EOB to/from [facility] and from EOB to recliner    Ambulation/Gait             Pre-gait activities: marching at EOB General Gait Details: unable to progress due to pain   Stairs             Wheelchair Mobility     Tilt Bed    Modified Rankin (Stroke Patients Only)       Balance Overall balance assessment: Needs assistance Sitting-balance support: Feet supported, No upper extremity supported Sitting balance-Leahy Scale: Fair Sitting balance - Comments: sitting EOB   Standing balance support: Reliant on assistive device for balance, During functional activity, Bilateral upper extremity  supported Standing balance-Leahy Scale: Poor Standing balance comment: with RW support                            Communication Communication Communication: No apparent difficulties  Cognition Arousal: Alert Behavior During Therapy: WFL for tasks  assessed/performed   PT - Cognitive impairments: No apparent impairments                         Following commands: Intact      Cueing Cueing Techniques: Verbal cues, Tactile cues  Exercises      General Comments        Pertinent Vitals/Pain Pain Assessment Pain Assessment: Faces Faces Pain Scale: Hurts whole lot Pain Location: back Pain Descriptors / Indicators: Guarding, Grimacing, Moaning, Sharp, Sore Pain Intervention(s): Limited activity within patient's tolerance, Monitored during session, Repositioned     PT Goals (current goals can now be found in the care plan section) Acute Rehab PT Goals Patient Stated Goal: return home with family to assist PT Goal Formulation: With patient Time For Goal Achievement: 01/31/24 Progress towards PT goals: Progressing toward goals    Frequency    Min 3X/week       AM-PAC PT "6 Clicks" Mobility   Outcome Measure  Help needed turning from your back to your side while in a flat bed without using bedrails?: A Little Help needed moving from lying on your back to sitting on the side of a flat bed without using bedrails?: A Lot Help needed moving to and from a bed to a chair (including a wheelchair)?: A Lot Help needed standing up from a chair using your arms (e.g., wheelchair or bedside chair)?: A Lot Help needed to walk in hospital room?: Total Help needed climbing 3-5 steps with a railing? : Total 6 Click Score: 11    End of Session Equipment Utilized During Treatment: Oxygen ;Back brace (TLSO) Activity Tolerance: Patient limited by pain;Patient limited by fatigue Patient left: with family/visitor present;with call bell/phone within reach;in chair Nurse Communication: Mobility status PT Visit Diagnosis: Unsteadiness on feet (R26.81);Other abnormalities of gait and mobility (R26.89);Muscle weakness (generalized) (M62.81)     Time: 1610-9604 PT Time Calculation (min) (ACUTE ONLY): 29 min  Charges:     $Therapeutic Activity: 23-37 mins PT General Charges $$ ACUTE PT VISIT: 1 Visit                     Michaelle Adolphus, PTA Acute Rehabilitation Services Secure Chat Preferred  [phone]    Michaelle Adolphus 01/31/2024, 4:23 PM

## 2024-02-01 DIAGNOSIS — S32030A Wedge compression fracture of third lumbar vertebra, initial encounter for closed fracture: Secondary | ICD-10-CM | POA: Diagnosis not present

## 2024-02-01 DIAGNOSIS — S32040A Wedge compression fracture of fourth lumbar vertebra, initial encounter for closed fracture: Secondary | ICD-10-CM | POA: Diagnosis not present

## 2024-02-01 LAB — VITAMIN B12: Vitamin B-12: 522 pg/mL (ref 180–914)

## 2024-02-01 LAB — FOLATE: Folate: 26.9 ng/mL (ref >5.9)

## 2024-02-01 MED ORDER — FOLIC ACID 1 MG PO TABS
1.0000 mg | ORAL_TABLET | Freq: Every day | ORAL | Status: DC
  Administered 2024-02-01 – 2024-02-06 (×6): 1 mg via ORAL
  Filled 2024-02-01 (×6): qty 1

## 2024-02-01 MED ORDER — CYANOCOBALAMIN 1000 MCG/ML IJ SOLN
1000.0000 ug | Freq: Every day | INTRAMUSCULAR | Status: DC
  Administered 2024-02-02 – 2024-02-06 (×5): 1000 ug via SUBCUTANEOUS
  Filled 2024-02-01 (×6): qty 1

## 2024-02-01 NOTE — Evaluation (Signed)
 Occupational Therapy Evaluation Patient Details Name: Kathleen [name] MRN: [id number] DOB: [birth year] Today's Date: 02/01/2024   History of Present Illness   Kathleen [name] is a 63 y.o. female admitted with intractable back pain she was found to have acute left pubic symphysis and right sacral alar fracture initially admitted to [facility] after discussion with IR was transferred to [facility] for L3 and L4 kyphoplasty.  PMH of chronic hypoxic respiratory failure (5L O2), ILD, sarcoidosis, Addison's disease, morbid obesity, bilateral hip AVN, osteoporosis with multiple vertebral fractures on chronic narcotics, and recent admissions for intractable pain due to compression fractures of L4 (4/12-4/16) and then L3 (5/9-5/14), readmitted on 01/20/2024 with acute L1 fracture, discharged home on 01/23/2024     Clinical Impressions PTA, pt recently back home since being at rehab and reports she had been getting back to performance of self-care tasks. Upon eval, pt husband present and very supportive; confirms that recently hospitalizations have lead to a significant change in status. Pt with decreased activity tolerance, strength, balance, and cardiopulmonary status. Pt needing up to min A for basic mobility and transfers with +2 present for safety and up to max A for LB Adl. Educated pt regarding use of TLSO brace and needing up to max A to don for new learning and decreased sitting balance/tolerance of pain at EOB. Due to pt motivation and excellent support, recommending intensive multidisciplinary rehabilitation >3 hours/day to optimize safety and independence in ADL.       If plan is discharge home, recommend the following:   A lot of help with walking and/or transfers;A lot of help with bathing/dressing/bathroom;Assistance with cooking/housework;Help with stairs or ramp for entrance;Assist for transportation     Functional Status Assessment   Patient has had a recent decline in their functional  status and demonstrates the ability to make significant improvements in function in a reasonable and predictable amount of time.     Equipment Recommendations   None recommended by OT     Recommendations for Other Services         Precautions/Restrictions   Precautions Precautions: Fall Recall of Precautions/Restrictions: Intact Precaution/Restrictions Comments: LSO brace (as needed) (TLSO brace in room; donned for mobility)     Mobility Bed Mobility Overal bed mobility: Needs Assistance Bed Mobility: Rolling, Sidelying to Sit, Sit to Sidelying Rolling: Contact guard assist Sidelying to sit: Min assist, Used rails, HOB elevated     Sit to sidelying: Contact guard assist General bed mobility comments: light assist for truncal elevation and steady due to heavy use of momentum    Transfers Overall transfer level: Needs assistance Equipment used: Rolling walker (2 wheels) Transfers: Sit to/from Stand, Bed to chair/wheelchair/BSC Sit to Stand: +2 physical assistance, Min assist     Step pivot transfers: Min assist, +2 physical assistance, +2 safety/equipment     General transfer comment: up from EOB and able to take several steps with light steadying assist      Balance Overall balance assessment: Needs assistance Sitting-balance support: Feet supported, No upper extremity supported Sitting balance-Leahy Scale: Fair Sitting balance - Comments: sitting EOB   Standing balance support: Reliant on assistive device for balance, During functional activity, Bilateral upper extremity supported Standing balance-Leahy Scale: Poor Standing balance comment: with RW support                           ADL either performed or assessed with clinical judgement   ADL Overall ADL's :  Needs assistance/impaired     Grooming: Contact guard assist;Minimal assistance;Sitting Grooming Details (indicate cue type and reason): reliant on UE for sitting EOB on air  mattress Upper Body Bathing: Minimal assistance;Sitting   Lower Body Bathing: Maximal assistance;Total assistance;Sitting/lateral leans   Upper Body Dressing : Sitting;Minimal assistance   Lower Body Dressing: Maximal assistance;Total assistance;Sitting/lateral leans   Toilet Transfer: Minimal assistance;Stand-pivot;Rolling walker (2 wheels);Ambulation Toilet Transfer Details (indicate cue type and reason): or ambulation ~5 ft Toileting- Clothing Manipulation and Hygiene: Maximal assistance;Sitting/lateral lean       Functional mobility during ADLs: Minimal assistance;Rolling walker (2 wheels)       Vision Baseline Vision/History: 1 Wears glasses Ability to See in Adequate Light: 1 Impaired Patient Visual Report: No change from baseline Vision Assessment?: No apparent visual deficits     Perception Perception: Not tested       Praxis Praxis: Not tested       Pertinent Vitals/Pain Pain Assessment Pain Assessment: 0-10 Pain Score: 10-Worst pain ever Pain Location: back Pain Descriptors / Indicators: Guarding, Grimacing, Moaning, Sharp, Sore Pain Intervention(s): Limited activity within patient's tolerance, Monitored during session     Extremity/Trunk Assessment Upper Extremity Assessment Upper Extremity Assessment: Overall WFL for tasks assessed (using functionally during session to optimize mobility and ADL)   Lower Extremity Assessment Lower Extremity Assessment: Defer to PT evaluation   Cervical / Trunk Assessment Cervical / Trunk Assessment:  (habitus)   Communication Communication Communication: No apparent difficulties   Cognition Arousal: Alert Behavior During Therapy: WFL for tasks assessed/performed Cognition: No apparent impairments                               Following commands: Intact       Cueing  General Comments   Cueing Techniques: Verbal cues;Tactile cues      Exercises     Shoulder Instructions      Home Living  Family/patient expects to be discharged to:: Private residence Living Arrangements: Spouse/significant other Available Help at Discharge: Family;Available PRN/intermittently Type of Home: House Home Access: Stairs to enter Entrance Stairs-Number of Steps: 1 Entrance Stairs-Rails: None Home Layout: One level     Bathroom Shower/Tub: Chief Strategy Officer: Handicapped height     Home Equipment: Agricultural consultant (2 wheels);Electric scooter;Shower seat;Hand held shower head;BSC/3in1          Prior Functioning/Environment Prior Level of Function : Needs assist       Physical Assist : Mobility (physical);ADLs (physical) Mobility (physical): Bed mobility;Transfers;Gait;Stairs ADLs (physical): Dressing;Bathing;Toileting;IADLs Mobility Comments: short household distances using RW, scooter ADLs Comments: Pt reports she had not been home long endough from rehab to know what she could do, She had been getting herself to [facility]    OT Problem List: Decreased strength;Decreased activity tolerance;Impaired balance (sitting and/or standing);Obesity;Pain   OT Treatment/Interventions: Self-care/ADL training;Therapeutic exercise;Therapeutic activities;Balance training;Patient/family education      OT Goals(Current goals can be found in the care plan section)   Acute Rehab OT Goals Patient Stated Goal: get home and back to daily life tasks she enjoys OT Goal Formulation: With patient Time For Goal Achievement: 02/15/24 Potential to Achieve Goals: Good   OT Frequency:  Min 2X/week    Co-evaluation              AM-PAC OT "6 Clicks" Daily Activity     Outcome Measure Help from another person eating meals?: A Little Help from another person taking  care of personal grooming?: A Little Help from another person toileting, which includes using toliet, bedpan, or urinal?: A Lot Help from another person bathing (including washing, rinsing, drying)?: A Lot Help from another  person to put on and taking off regular upper body clothing?: A Little Help from another person to put on and taking off regular lower body clothing?: A Lot 6 Click Score: 15   End of Session Equipment Utilized During Treatment: Rolling walker (2 wheels);Gait belt;Oxygen  (5L) Nurse Communication: Mobility status  Activity Tolerance: Patient tolerated treatment well Patient left: in bed;with call bell/phone within reach;with bed alarm set;with family/visitor present  OT Visit Diagnosis: Unsteadiness on feet (R26.81);Other abnormalities of gait and mobility (R26.89);Muscle weakness (generalized) (M62.81)                Time: 2130-8657 OT Time Calculation (min): 23 min Charges:  OT General Charges $OT Visit: 1 Visit OT Evaluation $OT Eval Low Complexity: 1 Low OT Treatments $Self Care/Home Management : 8-22 mins  Karilyn Ouch, OTR/L [facility] Acute Rehabilitation Office: [phone]   Kathleen [name] 02/01/2024, 5:40 PM

## 2024-02-01 NOTE — Progress Notes (Addendum)
 Kathleen [name]

## 2024-02-01 NOTE — Progress Notes (Signed)
 [facility] PROGRESS NOTE    Progress Note  Kathleen [name]  AVW:[id number] DOB: February 02, 1961 DOA: 01/25/2024 PCP: Bennet Brasil, MD     Brief Narrative:   Kathleen [name] is an 63 y.o. female past medical history of interstitial lung disease, fsarcoidosis, chronic respiratory failure with hypoxia on 5 L of oxygen  morbid obesity Addison's disease bilateral hip avascular necrosis lumbar back pain and lumbar compression fracture comes back to the hospital with worsening lumbar and pelvic pain after recently being discharged on 01/23/2024 for L1 compression fracture she followed up with Dr. [name] after recent hospitalization, as an outpatient Dr. [name] was scheduling her for kyphoplasty after pulmonary clearance, but returns due to intractable back pain she was found to have acute left pubic symphysis and right sacral alar fracture initially admitted to [facility] after discussion with IR was transferred to [facility] for L3 and L4 kyphoplasty.  IR recommended kyphoplasty after clearing from anesthesia team at [facility].Patient has a follow-up appointment with endocrinologist in 2 months.   Assessment/Plan:   Intractable acute on chronic Lower back pain due to osteoporotic fracture involving L1, L3 and L4/acute left pubic symphysis and right sacral alar fracture: Interventional radiology discussed with family patient does not want to go through with this kyphoplasty. Continue conservative management with narcotics and TLSO. Continue bowel regimen. We have had a long discussion about DNR/DNI she does not want to be intubated or resuscitated. PT evaluated suggested inpatient rehab, evaluated by inpatient rehab nurse patient does not appear to demonstrate medical necessity to justify inpatient rehab. After hearing this the daughter and patient became dissatisfied and have asked to have a meeting with the social worker and inpatient rehab nurse. OT evaluation is  pending.   Sarcoidosis/interstitial lung disease (HCC)/  Chronic respiratory failure with hypoxia (HCC): On 3 L of oxygen  and prednisone  5. At risk of CO2 retention.  Left femoral head avascular necrosis not a surgical candidate: Continue supportive care.  E. coli UTI: To complete a 3-day course of IV Rocephin .  Normocytic anemia: With a borderline MCV of 96 and RDW of 16. Check a B12 and folate. Start medication.  Addison's disease: Continue hydrocortisone .  Chronic hypotension: Continue midodrine .  Hyperlipidemia: Continue statins B  Hypothyroidism: Continue Synthroid .  Urinary retention: Continue Flomax .  Normocytic anemia: Follow-up PCP as an outpatient.  Acute on chronic pain syndrome: Continue current regimen.  Anxiety and depression: Continue buspirone  Cymbalta  and Atarax .  Morbid obesity: She has been counseled. Estimated body mass index is 39.75 kg/m as calculated from the following:   Height as of this encounter: 5\' 10"  (1.778 m).   Weight as of this encounter: 125.6 kg.  DVT prophylaxis: lovenox  Family Communication:none  Status is: Inpatient Remains inpatient appropriate because: Acute compression fracture    Code Status:     Code Status Orders  (From admission, onward)           Start     Ordered   01/26/24 0943  Full code  Continuous       Question:  By:  Answer:  Consent: discussion documented in EHR   01/26/24 0943           Code Status History     Date Active Date Inactive Code Status Order ID Comments User Context   01/26/2024 0425 01/26/2024 0943 Full Code [id number]  Twilla Galea, DO Inpatient   01/21/2024 0201 01/23/2024 2143 Full Code [id number]  Arnulfo Larch, MD ED   01/03/2024 2051 01/08/2024 1946  Full Code [id number]  Epifanio Haste, MD ED   12/07/2023 2047 12/11/2023 1800 Full Code [id number]  Pati Bonine, MD ED   04/07/2023 0301 04/09/2023 2117 Full Code [id number]  Walton Guppy, MD ED   03/10/2023  1256 03/14/2023 2115 Full Code [id number]  Demaris Fillers, MD ED   12/12/2022 2147 12/15/2022 1706 Full Code [id number]  Pati Bonine, MD Inpatient   05/22/2021 0009 05/22/2021 2101 Full Code [id number]  Twilla Galea, DO ED   01/27/2020 0334 01/28/2020 1527 Full Code [id number]  Ozell Blunt, MD ED   11/04/2017 2153 11/06/2017 1614 Full Code [id number]  Ozell Blunt, MD Inpatient   10/21/2017 2047 10/22/2017 1631 Full Code [id number]  Vanita Gens, MD Inpatient   10/16/2017 0108 10/18/2017 1710 Full Code [id number]  Danice Dural, MD ED   08/17/2016 1917 08/22/2016 0402 Full Code [id number]  Malka Sea, DO Inpatient   12/28/2015 1624 01/02/2016 2154 Full Code [id number]  Lena Qualia, MD ED   01/16/2013 2209 01/21/2013 1520 Full Code [id number]  Maylene Spear, MD Inpatient         IV Access:   Peripheral IV   Procedures and diagnostic studies:   No results found.   Medical Consultants:   None.   Subjective:    Kathleen [name] pain is controlled has not had a bowel movement.  Objective:    Vitals:   02/01/24 0743 02/01/24 0801 02/01/24 0802 02/01/24 0803  BP: (!) 116/40     Pulse: 89 91    Resp: 18 18    Temp: 98 F (36.7 C)     TempSrc:      SpO2: 95% 95% 95% 95%  Weight:      Height:       SpO2: 95 % O2 Flow Rate (L/min): 4 L/min FiO2 (%): 99 %   Intake/Output Summary (Last 24 hours) at 02/01/2024 1016 Last data filed at 01/31/2024 1120 Gross per 24 hour  Intake 360 ml  Output 1000 ml  Net -640 ml   Filed Weights   01/25/24 1908  Weight: 125.6 kg    Exam: General exam: In no acute distress. Respiratory system: Good air movement and clear to auscultation. Cardiovascular system: S1 & S2 heard, RRR. No JVD. Gastrointestinal system: Abdomen is nondistended, soft and nontender.  Extremities: No pedal edema. Skin: No rashes, lesions or ulcers Psychiatry: Judgement and insight appear normal. Mood & affect appropriate.  Data Reviewed:    Labs: Basic  Metabolic Panel: Recent Labs  Lab 01/25/24 2128 01/26/24 0523 01/27/24 0436 01/29/24 0604  NA 141 141 141 137  K 3.9 3.7 3.9 3.8  CL 98 100 103 94*  CO2 34* 33* 29 34*  GLUCOSE 128* 102* 117* 108*  BUN 8 11 8 9   CREATININE 0.80 0.72 0.81 0.86  CALCIUM  8.9 8.8* 8.8* 8.8*  MG  --  2.2  --  2.0  PHOS  --  3.9  --  4.1   GFR Estimated Creatinine Clearance: 99 mL/min (by C-G formula based on SCr of 0.86 mg/dL). Liver Function Tests: Recent Labs  Lab 01/25/24 2128 01/26/24 0523 01/29/24 0604  AST 18 17  --   ALT 25 23  --   ALKPHOS 96 92  --   BILITOT 0.5 0.4  --   PROT 5.4* 5.5*  --   ALBUMIN 2.9* 2.8* 2.6*   No results for input(s): "LIPASE", "AMYLASE" in the last 168 hours. No results for input(s): "AMMONIA " in  the last 168 hours. Coagulation profile No results for input(s): "INR", "PROTIME" in the last 168 hours. COVID-19 Labs  No results for input(s): "DDIMER", "FERRITIN", "LDH", "CRP" in the last 72 hours.   Lab Results  Component Value Date   SARSCOV2NAA NEGATIVE 08/05/2023   SARSCOV2NAA NEGATIVE 05/20/2023   SARSCOV2NAA NEGATIVE 04/06/2023   SARSCOV2NAA NEGATIVE 03/10/2023    CBC: Recent Labs  Lab 01/25/24 2128 01/26/24 0523 01/27/24 0436 01/29/24 0604  WBC 9.1 9.0 9.7 9.3  NEUTROABS 6.8  --   --   --   HGB 9.1* 8.8* 9.1* 8.3*  HCT 30.2* 29.4* 31.0* 27.3*  MCV 98.1 98.0 99.0 96.5  PLT 415* 423* 388 391   Cardiac Enzymes: No results for input(s): "CKTOTAL", "CKMB", "CKMBINDEX", "TROPONINI" in the last 168 hours. BNP (last 3 results) No results for input(s): "PROBNP" in the last 8760 hours. CBG: No results for input(s): "GLUCAP" in the last 168 hours. D-Dimer: No results for input(s): "DDIMER" in the last 72 hours. Hgb A1c: No results for input(s): "HGBA1C" in the last 72 hours. Lipid Profile: No results for input(s): "CHOL", "HDL", "LDLCALC", "TRIG", "CHOLHDL", "LDLDIRECT" in the last 72 hours. Thyroid  function studies: No results for  input(s): "TSH", "T4TOTAL", "T3FREE", "THYROIDAB" in the last 72 hours.  Invalid input(s): "FREET3" Anemia work up: No results for input(s): "VITAMINB12", "FOLATE", "FERRITIN", "TIBC", "IRON", "RETICCTPCT" in the last 72 hours.  Sepsis Labs: Recent Labs  Lab 01/25/24 2128 01/26/24 0523 01/27/24 0436 01/29/24 0604  WBC 9.1 9.0 9.7 9.3   Microbiology Recent Results (from the past 240 hours)  Urine Culture     Status: Abnormal   Collection Time: 01/25/24 11:08 PM   Specimen: Urine, Clean Catch  Result Value Ref Range Status   Specimen Description   Final    URINE, CLEAN CATCH Performed at [facility], [address] [place], [place] [address]    Special Requests   Final    NONE Performed at [facility], [address], [place], [place] [address]    Culture >=100,000 COLONIES/mL ESCHERICHIA COLI (A)  Final   Report Status 01/28/2024 FINAL  Final   Organism ID, Bacteria ESCHERICHIA COLI (A)  Final      Susceptibility   Escherichia coli - MIC*    AMPICILLIN 8 SENSITIVE Sensitive     CEFAZOLIN  <=4 SENSITIVE Sensitive     CEFEPIME  <=0.12 SENSITIVE Sensitive     CEFTRIAXONE  <=0.25 SENSITIVE Sensitive     CIPROFLOXACIN  <=0.25 SENSITIVE Sensitive     GENTAMICIN <=1 SENSITIVE Sensitive     IMIPENEM <=0.25 SENSITIVE Sensitive     NITROFURANTOIN <=16 SENSITIVE Sensitive     TRIMETH /SULFA  <=20 SENSITIVE Sensitive     AMPICILLIN/SULBACTAM <=2 SENSITIVE Sensitive     PIP/TAZO <=4 SENSITIVE Sensitive ug/mL    * >=100,000 COLONIES/mL ESCHERICHIA COLI     Medications:    alendronate   70 mg Oral QAC breakfast   arformoterol   15 mcg Nebulization BID   budesonide   0.5 mg Nebulization BID   busPIRone   15 mg Oral TID   Chlorhexidine  Gluconate Cloth  6 each Topical Daily   cyclobenzaprine   5 mg Oral TID   DULoxetine   60 mg Oral Daily   enoxaparin  (LOVENOX ) injection  60 mg Subcutaneous Q24H   feeding supplement  237 mL Oral BID BM   ferrous sulfate   325 mg Oral Weekly   furosemide    20 mg Oral Q M,W,F   hydrocortisone   10 mg Oral Q lunch   hydrocortisone   20 mg Oral Daily   levothyroxine   50 mcg Oral QAC breakfast   lidocaine   1 patch Transdermal Q24H   linaclotide   72 mcg Oral QAC breakfast   midodrine   5 mg Oral BID WC   pantoprazole   40 mg Oral Daily   polyethylene glycol  17 g Oral BID   predniSONE   5 mg Oral Q breakfast   prochlorperazine   10 mg Intravenous Once   revefenacin   175 mcg Nebulization Daily   rosuvastatin   20 mg Oral Daily   senna-docusate  2 tablet Oral QHS   tamsulosin   0.4 mg Oral QPC supper   Vitamin D  (Ergocalciferol )  50,000 Units Oral Q Sun   Continuous Infusions:    LOS: 6 days   Macdonald Savoy  [facility]  02/01/2024, 10:16 AM

## 2024-02-01 NOTE — TOC Progression Note (Addendum)
 Transition of Care [facility]) - Initial/Assessment Note    Patient Details  Name: Kathleen [name] MRN: [id number] Date of Birth: 03/22/1961  Transition of [facility]) CM/SW Contact:    Maya Sparrow, LCSW Phone Number: 02/01/2024, 12:47 PM  Clinical Narrative:                 CSW received consult to contact pt to discuss discharge options.  CSW spoke with patient and patient requested that CSW call daughter, Kathleen [name].    CSW contacted the daughter and daughter inquired about patient admitting to CIR.  Daughter was informed that patient is not a candidate for admission at this time and provided with other possible discharge options.  Daughter is adamant that patient discharges to CIR due to patient previously being in a SNF and receiving home health, but not receiving the level/ amount of PT that family feels would assist patient with improving mobility.  Daughter also stated that the patient would have to pay if she returned to SNF as patient is in co-pay days.  Care team updated and TOC will continue to follow.   Expected Discharge Plan: Home w Home Health Services Barriers to Discharge: Continued Medical Work up   Patient Goals and CMS Choice Patient states their goals for this hospitalization and ongoing recovery are:: return back home with [facility] CMS Medicare.gov Compare Post Acute Care list provided to:: Patient Choice offered to / list presented to : Patient      Expected Discharge Plan and Services In-house Referral: Clinical Social Work Discharge Planning Services: CM Consult Post Acute Care Choice: Durable Medical Equipment, Home Health Living arrangements for the past 2 months: Single Family Home                           HH Arranged: RN, PT, OT HH Agency: [facility] Date [facility] Agency Contacted: 01/27/24 Time HH Agency Contacted: 1201 Representative spoke with at [facility] Agency: Randel Buss  Prior Living Arrangements/Services Living arrangements for the past 2 months: Single  Family Home Lives with:: Spouse Patient language and need for interpreter reviewed:: Yes Do you feel safe going back to the place where you live?: Yes      Need for Family Participation in Patient Care: No (Comment) Care giver support system in place?: Yes (comment) Current home services: DME, Home PT Criminal Activity/Legal Involvement Pertinent to Current Situation/Hospitalization: No - Comment as needed  Activities of Daily Living   ADL Screening (condition at time of admission) Independently performs ADLs?: No Does the patient have a NEW difficulty with bathing/dressing/toileting/self-feeding that is expected to last >3 days?: No Does the patient have a NEW difficulty with getting in/out of bed, walking, or climbing stairs that is expected to last >3 days?: No Does the patient have a NEW difficulty with communication that is expected to last >3 days?: No Is the patient deaf or have difficulty hearing?: No Does the patient have difficulty seeing, even when wearing glasses/contacts?: No Does the patient have difficulty concentrating, remembering, or making decisions?: No  Permission Sought/Granted      Share Information with NAME: Kathleen [name]     Permission granted to share info w Relationship: Patient     Emotional Assessment Appearance:: Appears stated age Attitude/Demeanor/Rapport: Engaged Affect (typically observed): Appropriate, Stable Orientation: : Oriented to Self, Oriented to Place, Oriented to  Time, Oriented to Situation Alcohol / Substance Use: Not Applicable Psych Involvement: No (comment)  Admission diagnosis:  Lower back pain [M54.50] Patient Active Problem List   Diagnosis Date Noted   Closed compression fracture of L4 lumbar vertebra, initial encounter (HCC) 01/29/2024   Lower back pain 01/26/2024   Closed compression fracture of L3 lumbar vertebra, initial encounter (HCC) 01/21/2024   Intractable low back pain 01/03/2024   Ambulatory dysfunction  12/11/2023   Uncontrolled pain 12/08/2023   Pressure injury of skin 12/08/2023   Intractable back pain 12/07/2023   Prediabetes 10/28/2023   Ankle fracture, bimalleolar, closed, left, sequela 03/10/2023   Closed fracture of left ankle 03/10/2023   Syndesmotic disruption of ankle, left, initial encounter 03/10/2023   Lumbar transverse process fracture (HCC) 01/02/2023   Acute on chronic hypoxic respiratory failure (HCC) 12/13/2022   Depression, major, single episode, moderate (HCC) 09/10/2022   Aortic atherosclerosis (HCC) 09/10/2022   Chronic respiratory failure with hypoxia (HCC) 05/22/2021   Anxiety and depression 05/22/2021   Vitamin D  deficiency 05/09/2021   Left foot drop 01/09/2021   Pulmonary HTN (HCC) 10/12/2020   Encounter for long-term opiate analgesic use 02/02/2020   Interstitial lung disease (HCC) 04/14/2019   GERD (gastroesophageal reflux disease) 10/15/2017   Personal history of noncompliance with medical treatment, presenting hazards to health 04/26/2017   Hypothyroidism 01/15/2017   Adrenal insufficiency (Addison's disease) (HCC) 08/22/2016   Symptomatic bradycardia    Adrenal insufficiency (HCC) 08/17/2016   Hypokalemia 08/17/2016   Chronic pain syndrome 04/20/2016   Irritable bowel syndrome with constipation 11/14/2015   Avascular necrosis of bone of hip, left (HCC) 04/05/2015   Post herpetic neuralgia 10/22/2014   Mixed hyperlipidemia 08/25/2014   Obstructive sleep apnea 02/18/2014   Fibromyalgia 02/11/2013   Bilateral lower extremity edema 01/22/2013   Hypotension 01/16/2013   Fibrocystic breast disease in female 04/09/2012   Sarcoidosis 06/21/2008   PCP:  Bennet Brasil, MD Pharmacy:   [facility] [id number] - [place], [place] - [address] [facility] [address] [place] [address] Phone: [phone] Fax: [phone]  [facility] [address] [place]  [address] Phone: [phone] Fax: [phone]  [facility] [address], [address] [place] [address] Phone: [phone] Fax: [phone]     Social Drivers of Health (SDOH) Social History: SDOH Screenings   Food Insecurity: No Food Insecurity (01/26/2024)  Housing: Low Risk  (01/26/2024)  Transportation Needs: No Transportation Needs (01/26/2024)  Utilities: Not At Risk (01/26/2024)  Alcohol Screen: Low Risk  (09/27/2023)  Depression (PHQ2-9): Low Risk  (01/01/2024)  Financial Resource Strain: Low Risk  (09/27/2023)  Physical Activity: Inactive (09/27/2023)  Social Connections: Socially Integrated (01/21/2024)  Stress: No Stress Concern Present (09/27/2023)  Tobacco Use: Low Risk  (01/25/2024)  Health Literacy: Medium Risk (12/20/2023)   Received from [facility]   SDOH Interventions:     Readmission Risk Interventions    01/27/2024   11:48 AM 12/11/2023   11:11 AM 04/08/2023   10:38 AM  Readmission Risk Prevention Plan  Transportation Screening Complete Complete Complete  HRI or Home Care Consult  Complete   Social Work Consult for Recovery Care Planning/Counseling  Complete   Palliative Care Screening  Not Applicable   Medication Review Oceanographer) Complete Complete Complete  HRI or Home Care Consult Complete  Complete  SW Recovery Care/Counseling Consult Complete  Complete  Palliative Care Screening Not Applicable  Not Applicable  Skilled Nursing Facility Patient Refused  Not Applicable

## 2024-02-01 NOTE — Plan of Care (Signed)

## 2024-02-01 NOTE — Progress Notes (Addendum)
 Inpatient Rehab Admissions Coordinator:   Pt.'s daughter is pushing hard for CIR. I will send case to insurance and pursue admit if they approve.   Wandalee Gust, MS, CCC-SLP Rehab Admissions Coordinator  [phone] (celll) [phone] (office)

## 2024-02-02 LAB — CREATININE, SERUM
Creatinine, Ser: 0.97 mg/dL (ref 0.44–1.00)
GFR, Estimated: >60 mL/min (ref >60)

## 2024-02-03 ENCOUNTER — Other Ambulatory Visit (HOSPITAL_COMMUNITY): Payer: Self-pay

## 2024-02-03 DIAGNOSIS — J9611 Chronic respiratory failure with hypoxia: Secondary | ICD-10-CM | POA: Diagnosis not present

## 2024-02-03 DIAGNOSIS — S32040A Wedge compression fracture of fourth lumbar vertebra, initial encounter for closed fracture: Secondary | ICD-10-CM | POA: Diagnosis not present

## 2024-02-03 DIAGNOSIS — E271 Primary adrenocortical insufficiency: Secondary | ICD-10-CM | POA: Diagnosis not present

## 2024-02-03 DIAGNOSIS — S32030A Wedge compression fracture of third lumbar vertebra, initial encounter for closed fracture: Secondary | ICD-10-CM | POA: Diagnosis not present

## 2024-02-03 DIAGNOSIS — M87052 Idiopathic aseptic necrosis of left femur: Secondary | ICD-10-CM | POA: Diagnosis not present

## 2024-02-03 LAB — BASIC METABOLIC PANEL WITH GFR
Anion gap: 9 (ref 5–15)
BUN: <5 mg/dL — ABNORMAL LOW (ref 8–23)
CO2: 35 mmol/L — ABNORMAL HIGH (ref 22–32)
Calcium: 8.5 mg/dL — ABNORMAL LOW (ref 8.9–10.3)
Chloride: 95 mmol/L — ABNORMAL LOW (ref 98–111)
Creatinine, Ser: 0.9 mg/dL (ref 0.44–1.00)
GFR, Estimated: >60 mL/min (ref >60)
Glucose, Bld: 147 mg/dL — ABNORMAL HIGH (ref 70–99)
Potassium: 3.7 mmol/L (ref 3.5–5.1)
Sodium: 139 mmol/L (ref 135–145)

## 2024-02-03 NOTE — Progress Notes (Signed)
 Physical Therapy Treatment Patient Details Name: Kathleen [name] MRN: [id number] DOB: [birth year] Today's Date: 02/03/2024   History of Present Illness Kathleen [name] is a 63 y.o. female admitted with intractable back pain she was found to have acute left pubic symphysis and right sacral alar fracture initially admitted to [facility] after discussion with IR was transferred to [facility] for L3 and L4 kyphoplasty, however decided to pursue conservative measures.  PMH of chronic hypoxic respiratory failure (5L O2), ILD, sarcoidosis, Addison's disease, morbid obesity, bilateral hip AVN, osteoporosis with multiple vertebral fractures on chronic narcotics, and recent admissions for intractable pain due to compression fractures of L4 (4/12-4/16) and then L3 (5/9-5/14), readmitted on 01/20/2024 with acute L1 fracture, discharged home on 01/23/2024    PT Comments  Pt received in supine and agreeable to session. Pt able to sit to EOB and transfer to recliner with min A. Pt relies on BUE support and use of momentum for all mobility tasks due to pain and weakness. Pt requires cues to slow movements and for techniques throughout session for reduced pain. Pt requires assist to don TLSO, however reports increased shortness of breath with use. Pt able to tolerate 2 sets of standing marches with a seated rest due to pain and DOE. Pt able to take a few steps forward and backwards, but unable to progress gait this session due to increased pain and fatigue. Education on pursed lip breathing and activity progression. Pt continues to benefit from PT services to progress toward functional mobility goals.    If plan is discharge home, recommend the following: A lot of help with walking and/or transfers;A lot of help with bathing/dressing/bathroom;Help with stairs or ramp for entrance;Assistance with cooking/housework   Can travel by private vehicle     No  Equipment Recommendations  None recommended by PT     Recommendations for Other Services       Precautions / Restrictions Precautions Precautions: Fall Recall of Precautions/Restrictions: Intact Precaution/Restrictions Comments: TLSO brace (when ambulating) Restrictions Weight Bearing Restrictions Per Provider Order: No     Mobility  Bed Mobility Overal bed mobility: Needs Assistance Bed Mobility: Rolling, Sidelying to Sit Rolling: Contact guard assist Sidelying to sit: Min assist, Used rails, HOB elevated       General bed mobility comments: Cues for technique, however pt demonstrates difficulty following due to heavy use of momentum and UE behind for power up. Reinforced education on logroll technique for reduced twisting and back pain    Transfers Overall transfer level: Needs assistance Equipment used: Rolling walker (2 wheels) Transfers: Sit to/from Stand, Bed to chair/wheelchair/BSC Sit to Stand: Min assist, From elevated surface   Step pivot transfers: Min assist       General transfer comment: STS from EOB and recliner with cues for hand placement and min A for anterior weight shift. Min A for balance to pivot to recliner and complete static standing marches.    Ambulation/Gait               General Gait Details: Pt able to take a few steps forwards and backwards, however unable to progress due to increased pain. Pt would require a chair follow to progress for safety   Stairs             Wheelchair Mobility     Tilt Bed    Modified Rankin (Stroke Patients Only)       Balance Overall balance assessment: Needs assistance Sitting-balance support: Feet supported, No upper  extremity supported Sitting balance-Leahy Scale: Fair Sitting balance - Comments: sitting EOB   Standing balance support: Reliant on assistive device for balance, During functional activity, Bilateral upper extremity supported Standing balance-Leahy Scale: Poor Standing balance comment: with RW support                             Communication Communication Communication: No apparent difficulties  Cognition Arousal: Alert Behavior During Therapy: WFL for tasks assessed/performed   PT - Cognitive impairments: No apparent impairments                         Following commands: Intact      Cueing Cueing Techniques: Verbal cues, Tactile cues  Exercises General Exercises - Lower Extremity Long Arc Quad: AROM, Seated, Both, 5 reps Hip Flexion/Marching: AROM, Seated, Both, 5 reps    General Comments        Pertinent Vitals/Pain Pain Assessment Pain Assessment: Faces Faces Pain Scale: Hurts even more Pain Location: back Pain Descriptors / Indicators: Guarding, Grimacing, Moaning, Sharp, Sore Pain Intervention(s): Limited activity within patient's tolerance, Monitored during session, Repositioned     PT Goals (current goals can now be found in the care plan section) Acute Rehab PT Goals Patient Stated Goal: return home with family to assist PT Goal Formulation: With patient Time For Goal Achievement: 01/31/24 Progress towards PT goals: Progressing toward goals    Frequency    Min 3X/week       AM-PAC PT "6 Clicks" Mobility   Outcome Measure  Help needed turning from your back to your side while in a flat bed without using bedrails?: A Little Help needed moving from lying on your back to sitting on the side of a flat bed without using bedrails?: A Little Help needed moving to and from a bed to a chair (including a wheelchair)?: A Little Help needed standing up from a chair using your arms (e.g., wheelchair or bedside chair)?: A Little Help needed to walk in hospital room?: Total Help needed climbing 3-5 steps with a railing? : Total 6 Click Score: 14    End of Session Equipment Utilized During Treatment: Oxygen ;Back brace (TLSO) Activity Tolerance: Patient tolerated treatment well;Patient limited by pain Patient left: with call bell/phone within reach;in  chair Nurse Communication: Mobility status PT Visit Diagnosis: Unsteadiness on feet (R26.81);Other abnormalities of gait and mobility (R26.89);Muscle weakness (generalized) (M62.81)     Time: 1478-2956 PT Time Calculation (min) (ACUTE ONLY): 23 min  Charges:    $Therapeutic Activity: 23-37 mins PT General Charges $$ ACUTE PT VISIT: 1 Visit                     Michaelle Adolphus, PTA Acute Rehabilitation Services Secure Chat Preferred  [phone]    Michaelle Adolphus 02/03/2024, 10:28 AM

## 2024-02-03 NOTE — Progress Notes (Addendum)
 Nutrition Follow-up  DOCUMENTATION CODES:   Obesity unspecified  INTERVENTION:   -Obtain new wt -Continue regular diet -Continue MVI with minerals daily -D/c Ensure Plus High Protein po BID, each supplement provides 350 kcal and 20 grams of protein -Magic cup BID with meals, each supplement provides 290 kcal and 9 grams of protein   NUTRITION DIAGNOSIS:   Increased nutrient needs related to acute illness as evidenced by estimated needs.  Ongoing  GOAL:   Patient will meet greater than or equal to 90% of their needs  Progressing   MONITOR:   PO intake, Supplement acceptance  REASON FOR ASSESSMENT:   Consult Assessment of nutrition requirement/status  ASSESSMENT:   Pt with medical history significant of complex medical history of the following: Adrenal insufficiency on chronic Cortef , avascular necrosis of left femoral head nonrepairable due to underlying severe pulmonary disease, restrictive lung disease secondary to sarcoidosis on chronic prednisone , chronic hypoxemic respiratory failure, GERD, hypothyroidism, mitral valve prolapse, chronic orthostatic hypotension on midodrine , chronic pain on narcotics followed by the [facility] pain clinic, vitamin D  deficiency anemia and chronic disease. Admitted secondary to lumbar pain.  5/31- CT of lumbar spine revealed Stable superior endplate fractures of L1, L3 and L4; pelvic CT revealed Acute left pubic symphyseal and right sacral alar compression fractures in keeping with a lateral compression type 1 injury, L4 superior endplate fracture, Avascular necrosis of the left femoral head with mild articular collapse  6/2- transferred to [facility] for kyphoplasty  Reviewed I/O's: -1.6 L x 24 hours and -2.4 L since admission  UOP: 1.8 L x 24 hours  Pt unavailable at time of visit. Attempted to speak with pt via call to hospital room phone, however, unable to reach. RD unable to obtain further nutrition-related history or complete  nutrition-focused physical exam at this time.    Case discussed with RN, who reports pt with good appetite. She is consuming about 75% of meals. She is not continue Ensure supplements, but RN reports consuming large amounts of diet cola.   Per IR notes, pt requesting to hold off on kyphoplasty due to concern for risk of respiratory compromise.   No new wt since admission.   CIR following for possible admission. Pt and family desire CIR placement.   Medications reviewed and include pulmicort , vitamin B-12, lovenox , ferrous sulfate , lasix , cortef , protonix , prednisone , senokot, and vitamin D .   Labs reviewed: CBGS: 157 (inpatient orders for glycemic control are none).    Diet Order:   Diet Order             Diet regular Room service appropriate? Yes; Fluid consistency: Thin  Diet effective now                   EDUCATION NEEDS:   No education needs have been identified at this time  Skin:  Skin Assessment: Reviewed RN Assessment  Last BM:  01/31/24  Height:   Ht Readings from Last 1 Encounters:  01/25/24 5\' 10"  (1.778 m)    Weight:   Wt Readings from Last 1 Encounters:  01/25/24 125.6 kg    Ideal Body Weight:  72.7 kg  BMI:  Body mass index is 39.75 kg/m.  Estimated Nutritional Needs:   Kcal:  2000-2200  Protein:  100-115 grams  Fluid:  2.0-2.2 L    Herschel Lords, RD, LDN, CDCES Registered Dietitian III Certified Diabetes Care and Education Specialist If unable to reach this RD, please use "RD Inpatient" group chat on secure chat  between hours of 8am-4 pm daily

## 2024-02-03 NOTE — Progress Notes (Signed)
 Physical Medicine and Rehabilitation Consult Reason for Consult: Rehab Referring Physician: Dr. Versa Gore   HPI: Kathleen [name] is a 63 y.o. female patient with past medical history interstitial lung disease, Addison's disease Addison's disease, bilateral hip AVN, lumbar back pain and lumbar compression fractures who was found to have L1, L3 and L4 fractures, acute left pubic symphysis fracture and right sacral alla fracture.  Family decided against kyphoplasty.  She is using opiate pain medications and TLSO.  She is currently on 3 L of oxygen .  Patient worked with PT and OT and found to have functional deficits.  Per chart review she lives in a 1 level home with one-step to enter.  Husband is home 24/7 and she has HHA.  Home: Home Living Family/patient expects to be discharged to:: Private residence Living Arrangements: Spouse/significant other Available Help at Discharge: Family, Available PRN/intermittently Type of Home: House Home Access: Stairs to enter Secretary/administrator of Steps: 1 Entrance Stairs-Rails: None Home Layout: One level Bathroom Shower/Tub: Engineer, manufacturing systems: Handicapped height Bathroom Accessibility: Yes Home Equipment: Agricultural consultant (2 wheels), Art gallery manager, Information systems manager, Hand held shower head, BSC/3in1  Functional History: Prior Function Prior Level of Function : Needs assist Physical Assist : Mobility (physical), ADLs (physical) Mobility (physical): Bed mobility, Transfers, Gait, Stairs ADLs (physical): Dressing, Bathing, Toileting, IADLs Mobility Comments: short household distances using RW, scooter ADLs Comments: Pt reports she had not been home long endough from rehab to know what she could do, She had been getting herself to [facility] Functional Status:  Mobility: Bed Mobility Overal bed mobility: Needs Assistance Bed Mobility: Rolling, Sidelying to Sit Rolling: Contact guard assist Sidelying to sit: Min assist, Used rails,  HOB elevated Supine to sit: HOB elevated, Min assist Sit to supine: Contact guard assist Sit to sidelying: Contact guard assist General bed mobility comments: Cues for technique, however pt demonstrates difficulty following due to heavy use of momentum and UE behind for power up. Reinforced education on logroll technique for reduced twisting and back pain Transfers Overall transfer level: Needs assistance Equipment used: Rolling walker (2 wheels) Transfers: Sit to/from Stand, Bed to chair/wheelchair/BSC Sit to Stand: Min assist, From elevated surface Bed to/from chair/wheelchair/BSC transfer type:: Step pivot Step pivot transfers: Min assist General transfer comment: STS from EOB and recliner with cues for hand placement and min A for anterior weight shift. Min A for balance to pivot to recliner and complete static standing marches. Ambulation/Gait Ambulation/Gait assistance: Min assist, Mod assist Gait Distance (Feet): 6 Feet Assistive device: Rolling walker (2 wheels) Gait Pattern/deviations: Decreased step length - left, Decreased stance time - right, Decreased stride length, Step-to pattern, Wide base of support General Gait Details: Pt able to take a few steps forwards and backwards, however unable to progress due to increased pain. Pt would require a chair follow to progress for safety Gait velocity: slow Pre-gait activities: marching at EOB    ADL: ADL Overall ADL's : Needs assistance/impaired Grooming: Contact guard assist, Minimal assistance, Sitting Grooming Details (indicate cue type and reason): reliant on UE for sitting EOB on air mattress Upper Body Bathing: Minimal assistance, Sitting Lower Body Bathing: Maximal assistance, Total assistance, Sitting/lateral leans Upper Body Dressing : Sitting, Minimal assistance Lower Body Dressing: Maximal assistance, Total assistance, Sitting/lateral leans Toilet Transfer: Minimal assistance, Stand-pivot, Rolling walker (2 wheels),  Ambulation Toilet Transfer Details (indicate cue type and reason): or ambulation ~5 ft Toileting- Clothing Manipulation and Hygiene: Maximal assistance, Sitting/lateral lean Functional mobility  during ADLs: Minimal assistance, Rolling walker (2 wheels)  Cognition: Cognition Orientation Level: Oriented X4 Cognition Arousal: Alert Behavior During Therapy: WFL for tasks assessed/performed   Review of Systems  Constitutional:  Negative for chills and fever.  HENT:  Negative for hearing loss.   Eyes:        She says she needs cataract surgery   Respiratory:         Chronic O2 [place] 4-5 L   Cardiovascular:  Negative for chest pain.  Gastrointestinal:  Negative for abdominal pain, constipation, diarrhea, nausea and vomiting.  Genitourinary:        Foley in place for retention  Musculoskeletal:  Positive for back pain and joint pain.  Neurological:  Positive for sensory change (intermittant leg sensory changes) and weakness.   Past Medical History:  Diagnosis Date   Adrenal insufficiency (HCC)    Anemia    Anxiety    Avascular bone necrosis (HCC)    Breast fibrocystic disorder    Chronic pain    Encounter for long-term opiate analgesic use 02/02/2020   Patient has chronic pain with femoral head avascular necrosis.  Is under pain contract   Fibromyalgia    Gastroesophageal reflux disease    Hypothyroidism    Mitral valve prolapse    Orthostatic hypotension    Peripheral neuropathy    PONV (postoperative nausea and vomiting)    Restrictive lung disease    Moderate to severe   Sarcoidosis    Biopsy proven - [facility]   Sarcoidosis    SOB (shortness of breath)    chronic   Past Surgical History:  Procedure Laterality Date   BIOPSY  10/17/2018   Procedure: BIOPSY;  Surgeon: Ruby Corporal, MD;  [facility];  Service: Endoscopy;;  duodenum, antrum, gastric body   BIOPSY  03/03/2021   Procedure: BIOPSY;  Surgeon: Ruby Corporal, MD;  [facility];   Service: Endoscopy;;   BIOPSY  09/11/2023   Procedure: BIOPSY;  Surgeon: Suzette Espy, MD;  [facility];  Service: Endoscopy;;   BREAST LUMPECTOMY Bilateral 01/12/2011   CHOLECYSTECTOMY N/A 04/17/2021   Procedure: LAPAROSCOPIC CHOLECYSTECTOMY;  Surgeon: Alanda Allegra, MD;  Location: [facility];  Service: General;  Laterality: N/A;   COLONOSCOPY WITH PROPOFOL  N/A 01/03/2022   Procedure: COLONOSCOPY WITH PROPOFOL ;  Surgeon: Ruby Corporal, MD;  [facility];  Service: Endoscopy;  Laterality: N/A;  220   ESOPHAGEAL DILATION N/A 03/03/2021   Procedure: ESOPHAGEAL DILATION;  Surgeon: Ruby Corporal, MD;  [facility];  Service: Endoscopy;  Laterality: N/A;   ESOPHAGOGASTRODUODENOSCOPY (EGD) WITH PROPOFOL  N/A 10/17/2018   Procedure: ESOPHAGOGASTRODUODENOSCOPY (EGD) WITH PROPOFOL ;  Surgeon: Ruby Corporal, MD;  [facility];  Service: Endoscopy;  Laterality: N/A;  2:25   ESOPHAGOGASTRODUODENOSCOPY (EGD) WITH PROPOFOL  N/A 03/03/2021   Procedure: ESOPHAGOGASTRODUODENOSCOPY (EGD) WITH PROPOFOL ;  Surgeon: Ruby Corporal, MD;  [facility];  Service: Endoscopy;  Laterality: N/A;  12:15   ESOPHAGOGASTRODUODENOSCOPY (EGD) WITH PROPOFOL  N/A 09/11/2023   Procedure: ESOPHAGOGASTRODUODENOSCOPY (EGD) WITH PROPOFOL ;  Surgeon: Suzette Espy, MD;  [facility];  Service: Endoscopy;  Laterality: N/A;  2:30 pm, asa 3, LM to see if pt can come earlier   [facility] DILATION N/A 09/11/2023   Procedure: Londa Rival DILATION;  Surgeon: Suzette Espy, MD;  [facility];  Service: Endoscopy;  Laterality: N/A;   ORIF ANKLE FRACTURE Left 03/10/2023   Procedure: OPEN REDUCTION  INTERNAL FIXATION (ORIF) ANKLE FRACTURE;  Surgeon: Diedra Fowler, MD;  [facility];  Service: Orthopedics;  Laterality: Left;   POLYPECTOMY  01/03/2022   Procedure: POLYPECTOMY INTESTINAL;  Surgeon: Ruby Corporal, MD;  [facility];  Service: Endoscopy;;    TUBAL LIGATION     Family History  Problem Relation Age of Onset   Cardiomyopathy Mother    Breast cancer Mother    Prostate cancer Father    Heart attack Maternal Grandmother    Heart attack Maternal Grandfather    Heart attack Paternal Grandmother    Bipolar disorder Daughter    Heart disease Maternal Aunt    Neurofibromatosis Cousin    Colon cancer Neg Hx    Social History:  reports that she has never smoked. She has never been exposed to tobacco smoke. She has never used smokeless tobacco. She reports that she does not drink alcohol and does not use drugs. Allergies:  Allergies  Allergen Reactions   Bee Venom Anaphylaxis   Contrast Media [Iodinated Contrast Media] Other (See Comments)    CT chest w/ contrast at OSH followed by SOB resolved w/ single dose steroids, never ENT swelling, intubation or pressors, has had multiple contrasted scans since   Morphine  Other (See Comments)    Substance with morphinan structure and opioid receptor agonist mechanism of action (substance)   Bactrim  [Sulfamethoxazole -Trimethoprim ] Nausea And Vomiting   Codeine Itching   Medications Prior to Admission  Medication Sig Dispense Refill   acetaminophen  (TYLENOL ) 500 MG tablet Take 2 tablets (1,000 mg total) by mouth 3 (three) times daily. 30 tablet 0   albuterol  (PROVENTIL ) (2.5 MG/3ML) 0.083% nebulizer solution Inhale 3 mL (2.5 mg total) by nebulization every four (4) hours as needed for wheezing or shortness of breath. 75 mL 3   budesonide  (PULMICORT ) 0.5 MG/2ML nebulizer solution Inhale the contents of 1 vial via nebulizer two (2) times a day. 360 mL 3   busPIRone  (BUSPAR ) 15 MG tablet Take 1 tablet (15 mg) by mouth 3 times daily. 90 tablet 4   calcitonin, salmon, (MIACALCIN /FORTICAL) 200 UNIT/ACT nasal spray Use 1 spray in one nostril per day, alternate nostrils every other day (may have 1 vial she should use this for at least 2 weeks but may use as long as 4 weeks) 3.7 mL 0   cyclobenzaprine   (FLEXERIL ) 5 MG tablet Take 1 tablet (5 mg total) by mouth 3 (three) times daily. 90 tablet 2   DULoxetine  (CYMBALTA ) 60 MG capsule Take 1 capsule (60 mg total) by mouth daily. 30 capsule 5   EPINEPHrine  0.3 mg/0.3 mL IJ SOAJ injection Inject 0.3 mg into the muscle as needed for anaphylaxis. 1 each 2   ferrous sulfate  325 (65 FE) MG EC tablet Take 325 mg by mouth once a week.     furosemide  (LASIX ) 20 MG tablet Take 1 tablet (20 mg total) by mouth every Monday, Wednesday, and Friday. 40 tablet 3   hydrocortisone  (CORTEF ) 10 MG tablet Take 2 tablets by mouth at 8:00 am and 1 tablet at lunch. (May double dose on sick days x 3-5 days) 120 tablet 3   hydrOXYzine  (ATARAX ) 25 MG tablet Take 1 tablet (25 mg total) by mouth 3 (three) times daily as needed for anxiety. 90 tablet 1   ipratropium-albuterol  (DUONEB) 0.5-2.5 (3) MG/3ML SOLN Take 3 mLs by nebulization every 8 (eight) hours. 270 mL 11   lactulose  (CHRONULAC ) 10 GM/15ML solution Take 15 mLs (10 g total) by mouth  daily as needed for mild constipation. 237 mL 2   levothyroxine  (SYNTHROID ) 50 MCG tablet Take 1 tablet by mouth daily before breakfast. 90 tablet 1   lidocaine  (LIDODERM ) 5 % Place 1 patch onto the skin daily. Remove & Discard patch within 12 hours or as directed by MD 30 patch 0   LINZESS  72 MCG capsule Take 1 capsule (72 mcg total) by mouth daily before breakfast. 30 capsule 3   midodrine  (PROAMATINE ) 5 MG tablet Take 1 tablet (5 mg total) by mouth 2 (two) times daily with a meal. 60 tablet 5   ondansetron  (ZOFRAN -ODT) 8 MG disintegrating tablet Dissolve 1 tablet (8 mg total) by mouth every 8 (eight) hours as needed. (Patient taking differently: Take 8 mg by mouth every 8 (eight) hours as needed for nausea or vomiting.) 15 tablet 5   oxyCODONE  (ROXICODONE ) 15 MG immediate release tablet Take 1 tablet (15 mg total) by mouth every 4 (four) hours as needed for moderate pain (pain score 4-6) or severe pain (pain score 7-10). 45 tablet 0    OXYGEN  Inhale 5 L into the lungs continuous.     pantoprazole  (PROTONIX ) 40 MG tablet Take 1 tablet (40 mg total) by mouth daily. 30 tablet 5   polyethylene glycol (MIRALAX  / GLYCOLAX ) 17 g packet Take 17 g by mouth 2 (two) times daily. 60 each 4   predniSONE  (DELTASONE ) 5 MG tablet Take 1 tablet (5 mg total) by mouth in the morning. 30 tablet 5   rosuvastatin  (CRESTOR ) 20 MG tablet Take 1 tablet (20 mg total) by mouth daily. 90 tablet 1   senna-docusate (SENOKOT-S) 8.6-50 MG tablet Take 2 tablets by mouth at bedtime. 60 tablet 3   tamsulosin  (FLOMAX ) 0.4 MG CAPS capsule Take 1 capsule (0.4 mg total) by mouth daily after supper. 30 capsule 11   Vitamin D , Ergocalciferol , (DRISDOL ) 1.25 MG (50000 UNIT) CAPS capsule Take 1 capsule (50,000 Units total) by mouth every Sunday. 5 capsule 5     Blood pressure (!) 144/80, pulse 93, temperature 97.9 F (36.6 C), temperature source Oral, resp. rate 19, height 5\' 10"  (1.778 m), weight 125.6 kg, last menstrual period 05/17/2016, SpO2 95%. Physical Exam  General: No apparent distress HEENT: Head is normocephalic, atraumatic, sclera anicteric, oral mucosa pink and moist Neck: Supple without JVD or lymphadenopathy Heart: Reg rate and rhythm. No murmurs rubs or gallops Chest: CTA bilaterally without wheezes, rales, or rhonchi; no distress, 4L [place] o2 Abdomen: Soft, non-tender, protuberant, bowel sounds positive. Extremities: No clubbing, cyanosis, or edema. Pulses are 2+ Psych: Pt's affect is appropriate. Pt is cooperative Skin: Clean and intact without signs of breakdown GU: Foley draining yellow urine Neuro:  AAOx3 Follows commands, CN 2-12 grossly intact + tremor 4+/5 b/l UE Bilateral lower extremities Hip flexion 3 out of 5, knee extension 4-5, ankle PF and DF 4-5   Results for orders placed or performed during the hospital encounter of 01/25/24 (from the past 24 hours)  Basic metabolic panel     Status: Abnormal   Collection Time: 02/03/24  5:49  AM  Result Value Ref Range   Sodium 139 135 - 145 mmol/L   Potassium 3.7 3.5 - 5.1 mmol/L   Chloride 95 (L) 98 - 111 mmol/L   CO2 35 (H) 22 - 32 mmol/L   Glucose, Bld 147 (H) 70 - 99 mg/dL   BUN <5 (L) 8 - 23 mg/dL   Creatinine, Ser 4.54 0.44 - 1.00 mg/dL   Calcium  8.5 (L)  8.9 - 10.3 mg/dL   GFR, Estimated >16 >10 mL/min   Anion gap 9 5 - 15   *Note: Due to a large number of results and/or encounters for the requested time period, some results have not been displayed. A complete set of results can be found in Results Review.   No results found.  Assessment/Plan: Diagnosis: L1,L3,L4 left pubic symphysis and R sacral alar fracture Does the need for close, 24 hr/day medical supervision in concert with the patient's rehab needs make it unreasonable for this patient to be served in a less intensive setting? Yes Co-Morbidities requiring supervision/potential complications:  -Sarcoid, interstitial lung disease, left femoral head AVN, UTI, anemia, Addison's disease, hypertension, hyperlipidemia, urinary retention, acute on chronic pain, anxiety and depression, morbid obesity Due to bladder management, bowel management, safety, skin/wound care, disease management, medication administration, pain management, and patient education, does the patient require 24 hr/day rehab nursing? Yes Does the patient require coordinated care of a physician, rehab nurse, therapy disciplines of Pt/OT to address physical and functional deficits in the context of the above medical diagnosis(es)? Yes Addressing deficits in the following areas: balance, endurance, locomotion, strength, transferring, bowel/bladder control, bathing, dressing, feeding, grooming, toileting, and psychosocial support Can the patient actively participate in an intensive therapy program of at least 3 hrs of therapy per day at least 5 days per week? Yes The potential for patient to make measurable gains while on inpatient rehab is good Anticipated  functional outcomes upon discharge from inpatient rehab are supervision  with PT, supervision and min assist with OT, supervision with SLP. Estimated rehab length of stay to reach the above functional goals is: 10-14 Anticipated discharge destination: Home Overall Rehab/Functional Prognosis: good  POST ACUTE RECOMMENDATIONS: This patient's condition is appropriate for continued rehabilitative care in the following setting: CIR Patient has agreed to participate in recommended program. Yes Note that insurance prior authorization may be required for reimbursement for recommended care.  Comment: I think pt would be a candidate for CIR, rehab coordinator to follow-up.      I have personally performed a face to face diagnostic evaluation of this patient. Additionally, I have examined the patient's medical record including any pertinent labs and radiographic images.    Thanks,  Lylia Sand, MD 02/03/2024

## 2024-02-03 NOTE — Progress Notes (Signed)
 [facility] PROGRESS NOTE    Progress Note  Kathleen [name]  ZOX:[id number] DOB: Jun 03, 1961 DOA: 01/25/2024 PCP: Bennet Brasil, MD     Brief Narrative:   Kathleen [name] is an 63 y.o. female past medical history of interstitial lung disease, fsarcoidosis, chronic respiratory failure with hypoxia on 5 L of oxygen  morbid obesity Addison's disease bilateral hip avascular necrosis lumbar back pain and lumbar compression fracture comes back to the hospital with worsening lumbar and pelvic pain after recently being discharged on 01/23/2024 for L1 compression fracture she followed up with Dr. [name] after recent hospitalization, as an outpatient Dr. [name] was scheduling her for kyphoplasty after pulmonary clearance, but returns due to intractable back pain she was found to have acute left pubic symphysis and right sacral alar fracture initially admitted to [facility] after discussion with IR was transferred to [facility] for L3 and L4 kyphoplasty.  IR recommended kyphoplasty after clearing from anesthesia team at [facility].Patient has a follow-up appointment with endocrinologist in 2 months.  Assessment/Plan:   Intractable acute on chronic Lower back pain due to osteoporotic fracture involving L1, L3 and L4/acute left pubic symphysis and right sacral alar fracture: Interventional radiology discussed with family patient does not want to go through with this kyphoplasty. Continue conservative management with narcotics and TLSO. Continue bowel regimen. We have had a long discussion about DNR/DNI she does not want to be intubated or resuscitated. PT evaluated suggested inpatient rehab, evaluated by inpatient rehab nurse patient does not appear to demonstrate medical necessity to justify inpatient rehab. Awaiting to hear from insurance approval for inpatient rehab.  Sarcoidosis/interstitial lung disease (HCC)/  Chronic respiratory failure with hypoxia (HCC): On 3 L of oxygen  and prednisone   5. At risk of CO2 retention.  Left femoral head avascular necrosis not a surgical candidate: Continue supportive care.  E. coli UTI: To complete a 3-day course of IV Rocephin .  Normocytic anemia: With a borderline MCV of 96 and RDW of 16. Check a B12 and folate. Start medication.  Addison's disease: Continue hydrocortisone .  Chronic hypotension: Continue midodrine .  Hyperlipidemia: Continue statins B  Hypothyroidism: Continue Synthroid .  Urinary retention: Continue Flomax .  Normocytic anemia: Follow-up PCP as an outpatient.  Acute on chronic pain syndrome: Continue current regimen.  Anxiety and depression: Continue buspirone  Cymbalta  and Atarax .  Morbid obesity: She has been counseled. Estimated body mass index is 39.75 kg/m as calculated from the following:   Height as of this encounter: 5\' 10"  (1.778 m).   Weight as of this encounter: 125.6 kg.  DVT prophylaxis: lovenox  Family Communication:none  Status is: Inpatient Remains inpatient appropriate because: Acute compression fracture    Code Status:     Code Status Orders  (From admission, onward)           Start     Ordered   01/26/24 0943  Full code  Continuous       Question:  By:  Answer:  Consent: discussion documented in EHR   01/26/24 0943           Code Status History     Date Active Date Inactive Code Status Order ID Comments User Context   01/26/2024 0425 01/26/2024 0943 Full Code [id number]  Twilla Galea, DO Inpatient   01/21/2024 0201 01/23/2024 2143 Full Code [id number]  Arnulfo Larch, MD ED   01/03/2024 2051 01/08/2024 1946 Full Code [id number]  Elgergawy, Ardia Kraft, MD ED   12/07/2023 2047 12/11/2023 1800 Full Code [id number]  Quintella Buck, Ejiroghene  E, MD ED   04/07/2023 0301 04/09/2023 2117 Full Code [id number]  Walton Guppy, MD ED   03/10/2023 1256 03/14/2023 2115 Full Code [id number]  Demaris Fillers, MD ED   12/12/2022 2147 12/15/2022 1706 Full Code [id number]  Pati Bonine, MD  Inpatient   05/22/2021 0009 05/22/2021 2101 Full Code [id number]  Twilla Galea, DO ED   01/27/2020 0334 01/28/2020 1527 Full Code [id number]  Ozell Blunt, MD ED   11/04/2017 2153 11/06/2017 1614 Full Code [id number]  Ozell Blunt, MD Inpatient   10/21/2017 2047 10/22/2017 1631 Full Code [id number]  Vanita Gens, MD Inpatient   10/16/2017 0108 10/18/2017 1710 Full Code [id number]  Danice Dural, MD ED   08/17/2016 1917 08/22/2016 0402 Full Code [id number]  Malka Sea, DO Inpatient   12/28/2015 1624 01/02/2016 2154 Full Code [id number]  Lena Qualia, MD ED   01/16/2013 2209 01/21/2013 1520 Full Code [id number]  Maylene Spear, MD Inpatient         IV Access:   Peripheral IV   Procedures and diagnostic studies:   No results found.   Medical Consultants:   None.   Subjective:    Kathleen [name] pain is controlled up in the chair.  Objective:    Vitals:   02/03/24 0541 02/03/24 0733 02/03/24 0815 02/03/24 0817  BP: (!) 119/42 (!) 104/32    Pulse: 92 85    Resp: 20     Temp: 97.8 F (36.6 C) 97.9 F (36.6 C)    TempSrc: Oral     SpO2: 95% 100% 100% 100%  Weight:      Height:       SpO2: 100 % O2 Flow Rate (L/min): 4 L/min FiO2 (%): 99 %   Intake/Output Summary (Last 24 hours) at 02/03/2024 0956 Last data filed at 02/03/2024 0543 Gross per 24 hour  Intake 237 ml  Output 1800 ml  Net -1563 ml   Filed Weights   01/25/24 1908  Weight: 125.6 kg    Exam: General exam: In no acute distress.  Morbidly obese Respiratory system: Good air movement and clear to auscultation. Cardiovascular system: S1 & S2 heard, RRR. No JVD. Gastrointestinal system: Abdomen is nondistended, soft and nontender.  Extremities: No pedal edema. Skin: No rashes, lesions or ulcers Psychiatry: Judgement and insight appear normal. Mood & affect appropriate.  Data Reviewed:    Labs: Basic Metabolic Panel: Recent Labs  Lab 01/29/24 0604 02/02/24 0539 02/03/24 0549  NA 137  --  139  K  3.8  --  3.7  CL 94*  --  95*  CO2 34*  --  35*  GLUCOSE 108*  --  147*  BUN 9  --  <5*  CREATININE 0.86 0.97 0.90  CALCIUM  8.8*  --  8.5*  MG 2.0  --   --   PHOS 4.1  --   --    GFR Estimated Creatinine Clearance: 94.6 mL/min (by C-G formula based on SCr of 0.9 mg/dL). Liver Function Tests: Recent Labs  Lab 01/29/24 0604  ALBUMIN 2.6*   No results for input(s): "LIPASE", "AMYLASE" in the last 168 hours. No results for input(s): "AMMONIA " in the last 168 hours. Coagulation profile No results for input(s): "INR", "PROTIME" in the last 168 hours. COVID-19 Labs  No results for input(s): "DDIMER", "FERRITIN", "LDH", "CRP" in the last 72 hours.   Lab Results  Component Value Date   SARSCOV2NAA NEGATIVE 08/05/2023   SARSCOV2NAA NEGATIVE 05/20/2023   SARSCOV2NAA NEGATIVE  04/06/2023   SARSCOV2NAA NEGATIVE 03/10/2023    CBC: Recent Labs  Lab 01/29/24 0604  WBC 9.3  HGB 8.3*  HCT 27.3*  MCV 96.5  PLT 391   Cardiac Enzymes: No results for input(s): "CKTOTAL", "CKMB", "CKMBINDEX", "TROPONINI" in the last 168 hours. BNP (last 3 results) No results for input(s): "PROBNP" in the last 8760 hours. CBG: No results for input(s): "GLUCAP" in the last 168 hours. D-Dimer: No results for input(s): "DDIMER" in the last 72 hours. Hgb A1c: No results for input(s): "HGBA1C" in the last 72 hours. Lipid Profile: No results for input(s): "CHOL", "HDL", "LDLCALC", "TRIG", "CHOLHDL", "LDLDIRECT" in the last 72 hours. Thyroid  function studies: No results for input(s): "TSH", "T4TOTAL", "T3FREE", "THYROIDAB" in the last 72 hours.  Invalid input(s): "FREET3" Anemia work up: Recent Labs    02/01/24 1530  VITAMINB12 522  FOLATE 26.9    Sepsis Labs: Recent Labs  Lab 01/29/24 0604  WBC 9.3   Microbiology Recent Results (from the past 240 hours)  Urine Culture     Status: Abnormal   Collection Time: 01/25/24 11:08 PM   Specimen: Urine, Clean Catch  Result Value Ref Range  Status   Specimen Description   Final    URINE, CLEAN CATCH Performed at [facility], [address], [place], [place] [address]    Special Requests   Final    NONE Performed at [facility], [address] [place], [place] [address]    Culture >=100,000 COLONIES/mL ESCHERICHIA COLI (A)  Final   Report Status 01/28/2024 FINAL  Final   Organism ID, Bacteria ESCHERICHIA COLI (A)  Final      Susceptibility   Escherichia coli - MIC*    AMPICILLIN 8 SENSITIVE Sensitive     CEFAZOLIN  <=4 SENSITIVE Sensitive     CEFEPIME  <=0.12 SENSITIVE Sensitive     CEFTRIAXONE  <=0.25 SENSITIVE Sensitive     CIPROFLOXACIN  <=0.25 SENSITIVE Sensitive     GENTAMICIN <=1 SENSITIVE Sensitive     IMIPENEM <=0.25 SENSITIVE Sensitive     NITROFURANTOIN <=16 SENSITIVE Sensitive     TRIMETH /SULFA  <=20 SENSITIVE Sensitive     AMPICILLIN/SULBACTAM <=2 SENSITIVE Sensitive     PIP/TAZO <=4 SENSITIVE Sensitive ug/mL    * >=100,000 COLONIES/mL ESCHERICHIA COLI     Medications:    arformoterol   15 mcg Nebulization BID   budesonide   0.5 mg Nebulization BID   busPIRone   15 mg Oral TID   Chlorhexidine  Gluconate Cloth  6 each Topical Daily   cyanocobalamin  1,000 mcg Subcutaneous Daily   cyclobenzaprine   5 mg Oral TID   DULoxetine   60 mg Oral Daily   enoxaparin  (LOVENOX ) injection  60 mg Subcutaneous Q24H   feeding supplement  237 mL Oral BID BM   ferrous sulfate   325 mg Oral Weekly   folic acid  1 mg Oral Daily   furosemide   20 mg Oral Q M,W,F   hydrocortisone   10 mg Oral Q lunch   hydrocortisone   20 mg Oral Daily   levothyroxine   50 mcg Oral QAC breakfast   lidocaine   1 patch Transdermal Q24H   linaclotide   72 mcg Oral QAC breakfast   midodrine   5 mg Oral BID WC   pantoprazole   40 mg Oral Daily   predniSONE   5 mg Oral Q breakfast   prochlorperazine   10 mg Intravenous Once   revefenacin   175 mcg Nebulization Daily   rosuvastatin   20 mg Oral Daily   senna-docusate  2 tablet Oral QHS  tamsulosin   0.4 mg  Oral QPC supper   Vitamin D  (Ergocalciferol )  50,000 Units Oral Q Sun   Continuous Infusions:    LOS: 8 days   Macdonald Savoy  [facility]  02/03/2024, 9:56 AM

## 2024-02-03 NOTE — Plan of Care (Signed)
   Problem: Education: Goal: Knowledge of General Education information will improve Description: Including pain rating scale, medication(s)/side effects and non-pharmacologic comfort measures Outcome: Progressing   Problem: Activity: Goal: Risk for activity intolerance will decrease Outcome: Progressing   Problem: Nutrition: Goal: Adequate nutrition will be maintained Outcome: Progressing

## 2024-02-03 NOTE — Consult Note (Signed)
 Physical Medicine and Rehabilitation Consult Reason for Consult: Rehab Referring Physician: Dr. Versa Gore     HPI: Kathleen [name] is a 63 y.o. female patient with past medical history interstitial lung disease, Addison's disease Addison's disease, bilateral hip AVN, lumbar back pain and lumbar compression fractures who was found to have L1, L3 and L4 fractures, acute left pubic symphysis fracture and right sacral alla fracture.  Family decided against kyphoplasty.  She is using opiate pain medications and TLSO.  She is currently on 3 L of oxygen .  Patient worked with PT and OT and found to have functional deficits.   Per chart review she lives in a 1 level home with one-step to enter.  Husband is home 24/7 and she has HHA.   Home: Home Living Family/patient expects to be discharged to:: Private residence Living Arrangements: Spouse/significant other Available Help at Discharge: Family, Available PRN/intermittently Type of Home: House Home Access: Stairs to enter Secretary/administrator of Steps: 1 Entrance Stairs-Rails: None Home Layout: One level Bathroom Shower/Tub: Engineer, manufacturing systems: Handicapped height Bathroom Accessibility: Yes Home Equipment: Agricultural consultant (2 wheels), Art gallery manager, Information systems manager, Hand held shower head, BSC/3in1  Functional History: Prior Function Prior Level of Function : Needs assist Physical Assist : Mobility (physical), ADLs (physical) Mobility (physical): Bed mobility, Transfers, Gait, Stairs ADLs (physical): Dressing, Bathing, Toileting, IADLs Mobility Comments: short household distances using RW, scooter ADLs Comments: Pt reports she had not been home long endough from rehab to know what she could do, She had been getting herself to [facility] Functional Status:  Mobility: Bed Mobility Overal bed mobility: Needs Assistance Bed Mobility: Rolling, Sidelying to Sit Rolling: Contact guard assist Sidelying to sit: Min assist, Used  rails, HOB elevated Supine to sit: HOB elevated, Min assist Sit to supine: Contact guard assist Sit to sidelying: Contact guard assist General bed mobility comments: Cues for technique, however pt demonstrates difficulty following due to heavy use of momentum and UE behind for power up. Reinforced education on logroll technique for reduced twisting and back pain Transfers Overall transfer level: Needs assistance Equipment used: Rolling walker (2 wheels) Transfers: Sit to/from Stand, Bed to chair/wheelchair/BSC Sit to Stand: Min assist, From elevated surface Bed to/from chair/wheelchair/BSC transfer type:: Step pivot Step pivot transfers: Min assist General transfer comment: STS from EOB and recliner with cues for hand placement and min A for anterior weight shift. Min A for balance to pivot to recliner and complete static standing marches. Ambulation/Gait Ambulation/Gait assistance: Min assist, Mod assist Gait Distance (Feet): 6 Feet Assistive device: Rolling walker (2 wheels) Gait Pattern/deviations: Decreased step length - left, Decreased stance time - right, Decreased stride length, Step-to pattern, Wide base of support General Gait Details: Pt able to take a few steps forwards and backwards, however unable to progress due to increased pain. Pt would require a chair follow to progress for safety Gait velocity: slow Pre-gait activities: marching at EOB   ADL: ADL Overall ADL's : Needs assistance/impaired Grooming: Contact guard assist, Minimal assistance, Sitting Grooming Details (indicate cue type and reason): reliant on UE for sitting EOB on air mattress Upper Body Bathing: Minimal assistance, Sitting Lower Body Bathing: Maximal assistance, Total assistance, Sitting/lateral leans Upper Body Dressing : Sitting, Minimal assistance Lower Body Dressing: Maximal assistance, Total assistance, Sitting/lateral leans Toilet Transfer: Minimal assistance, Stand-pivot, Rolling walker (2  wheels), Ambulation Toilet Transfer Details (indicate cue type and reason): or ambulation ~5 ft Toileting- Clothing Manipulation and  Hygiene: Maximal assistance, Sitting/lateral lean Functional mobility during ADLs: Minimal assistance, Rolling walker (2 wheels)   Cognition: Cognition Orientation Level: Oriented X4 Cognition Arousal: Alert Behavior During Therapy: WFL for tasks assessed/performed     Review of Systems  Constitutional:  Negative for chills and fever.  HENT:  Negative for hearing loss.   Eyes:        She says she needs cataract surgery   Respiratory:         Chronic O2 [place] 4-5 L   Cardiovascular:  Negative for chest pain.  Gastrointestinal:  Negative for abdominal pain, constipation, diarrhea, nausea and vomiting.  Genitourinary:        Foley in place for retention  Musculoskeletal:  Positive for back pain and joint pain.  Neurological:  Positive for sensory change (intermittant leg sensory changes) and weakness.        Past Medical History:  Diagnosis Date   Adrenal insufficiency (HCC)     Anemia     Anxiety     Avascular bone necrosis (HCC)     Breast fibrocystic disorder     Chronic pain     Encounter for long-term opiate analgesic use 02/02/2020    Patient has chronic pain with femoral head avascular necrosis.  Is under pain contract   Fibromyalgia     Gastroesophageal reflux disease     Hypothyroidism     Mitral valve prolapse     Orthostatic hypotension     Peripheral neuropathy     PONV (postoperative nausea and vomiting)     Restrictive lung disease      Moderate to severe   Sarcoidosis      Biopsy proven - [facility]   Sarcoidosis     SOB (shortness of breath)      chronic             Past Surgical History:  Procedure Laterality Date   BIOPSY   10/17/2018    Procedure: BIOPSY;  Surgeon: Ruby Corporal, MD;  [facility];  Service: Endoscopy;;  duodenum, antrum, gastric body   BIOPSY   03/03/2021    Procedure: BIOPSY;  Surgeon:  Ruby Corporal, MD;  [facility]: [facility];  Service: Endoscopy;;   BIOPSY   09/11/2023    Procedure: BIOPSY;  Surgeon: Suzette Espy, MD;  [facility];  Service: Endoscopy;;   BREAST LUMPECTOMY Bilateral 01/12/2011   CHOLECYSTECTOMY N/A 04/17/2021    Procedure: LAPAROSCOPIC CHOLECYSTECTOMY;  Surgeon: Alanda Allegra, MD;  Location: [facility];  Service: General;  Laterality: N/A;   COLONOSCOPY WITH PROPOFOL  N/A 01/03/2022    Procedure: COLONOSCOPY WITH PROPOFOL ;  Surgeon: Ruby Corporal, MD;  [facility];  Service: Endoscopy;  Laterality: N/A;  220   ESOPHAGEAL DILATION N/A 03/03/2021    Procedure: ESOPHAGEAL DILATION;  Surgeon: Ruby Corporal, MD;  [facility];  Service: Endoscopy;  Laterality: N/A;   ESOPHAGOGASTRODUODENOSCOPY (EGD) WITH PROPOFOL  N/A 10/17/2018    Procedure: ESOPHAGOGASTRODUODENOSCOPY (EGD) WITH PROPOFOL ;  Surgeon: Ruby Corporal, MD;  [facility];  Service: Endoscopy;  Laterality: N/A;  2:25   ESOPHAGOGASTRODUODENOSCOPY (EGD) WITH PROPOFOL  N/A 03/03/2021    Procedure: ESOPHAGOGASTRODUODENOSCOPY (EGD) WITH PROPOFOL ;  Surgeon: Ruby Corporal, MD;  [facility];  Service: Endoscopy;  Laterality: N/A;  12:15   ESOPHAGOGASTRODUODENOSCOPY (EGD) WITH PROPOFOL  N/A 09/11/2023    Procedure: ESOPHAGOGASTRODUODENOSCOPY (EGD) WITH PROPOFOL ;  Surgeon: Suzette Espy, MD;  [facility];  Service: Endoscopy;  Laterality: N/A;  2:30 pm, asa 3, LM to see if pt can come earlier   [facility] DILATION N/A 09/11/2023    Procedure: Londa Rival DILATION;  Surgeon: Suzette Espy, MD;  [facility];  Service: Endoscopy;  Laterality: N/A;   ORIF ANKLE FRACTURE Left 03/10/2023    Procedure: OPEN REDUCTION INTERNAL FIXATION (ORIF) ANKLE FRACTURE;  Surgeon: Diedra Fowler, MD;  Location: [facility];  Service: Orthopedics;  Laterality: Left;   POLYPECTOMY   01/03/2022    Procedure: POLYPECTOMY INTESTINAL;  Surgeon: Ruby Corporal, MD;  [facility];  Service: Endoscopy;;   TUBAL LIGATION                 Family History  Problem Relation Age of Onset   Cardiomyopathy Mother     Breast cancer Mother     Prostate cancer Father     Heart attack Maternal Grandmother     Heart attack Maternal Grandfather     Heart attack Paternal Grandmother     Bipolar disorder Daughter     Heart disease Maternal Aunt     Neurofibromatosis Cousin     Colon cancer Neg Hx          Social History:  reports that she has never smoked. She has never been exposed to tobacco smoke. She has never used smokeless tobacco. She reports that she does not drink alcohol and does not use drugs. Allergies:  Allergies       Allergies  Allergen Reactions   Bee Venom Anaphylaxis   Contrast Media [Iodinated Contrast Media] Other (See Comments)      CT chest w/ contrast at OSH followed by SOB resolved w/ single dose steroids, never ENT swelling, intubation or pressors, has had multiple contrasted scans since   Morphine  Other (See Comments)      Substance with morphinan structure and opioid receptor agonist mechanism of action (substance)   Bactrim  [Sulfamethoxazole -Trimethoprim ] Nausea And Vomiting   Codeine Itching            Medications Prior to Admission  Medication Sig Dispense Refill   acetaminophen  (TYLENOL ) 500 MG tablet Take 2 tablets (1,000 mg total) by mouth 3 (three) times daily. 30 tablet 0   albuterol  (PROVENTIL ) (2.5 MG/3ML) 0.083% nebulizer solution Inhale 3 mL (2.5 mg total) by nebulization every four (4) hours as needed for wheezing or shortness of breath. 75 mL 3   budesonide  (PULMICORT ) 0.5 MG/2ML nebulizer solution Inhale the contents of 1 vial via nebulizer two (2) times a day. 360 mL 3   busPIRone  (BUSPAR ) 15 MG tablet Take 1 tablet (15 mg) by mouth 3 times daily. 90 tablet 4   calcitonin, salmon, (MIACALCIN /FORTICAL) 200 UNIT/ACT nasal spray Use 1 spray in one nostril per day, alternate nostrils every  other day (may have 1 vial she should use this for at least 2 weeks but may use as long as 4 weeks) 3.7 mL 0   cyclobenzaprine  (FLEXERIL ) 5 MG tablet Take 1 tablet (5 mg total) by mouth 3 (three) times daily. 90 tablet 2   DULoxetine  (CYMBALTA ) 60 MG capsule Take 1 capsule (60 mg total) by mouth daily. 30 capsule 5   EPINEPHrine  0.3 mg/0.3 mL IJ SOAJ injection Inject 0.3 mg into the muscle as needed for anaphylaxis. 1 each 2   ferrous sulfate  325 (65 FE) MG EC tablet Take 325 mg by mouth once a week.       furosemide  (LASIX ) 20  MG tablet Take 1 tablet (20 mg total) by mouth every Monday, Wednesday, and Friday. 40 tablet 3   hydrocortisone  (CORTEF ) 10 MG tablet Take 2 tablets by mouth at 8:00 am and 1 tablet at lunch. (May double dose on sick days x 3-5 days) 120 tablet 3   hydrOXYzine  (ATARAX ) 25 MG tablet Take 1 tablet (25 mg total) by mouth 3 (three) times daily as needed for anxiety. 90 tablet 1   ipratropium-albuterol  (DUONEB) 0.5-2.5 (3) MG/3ML SOLN Take 3 mLs by nebulization every 8 (eight) hours. 270 mL 11   lactulose  (CHRONULAC ) 10 GM/15ML solution Take 15 mLs (10 g total) by mouth daily as needed for mild constipation. 237 mL 2   levothyroxine  (SYNTHROID ) 50 MCG tablet Take 1 tablet by mouth daily before breakfast. 90 tablet 1   lidocaine  (LIDODERM ) 5 % Place 1 patch onto the skin daily. Remove & Discard patch within 12 hours or as directed by MD 30 patch 0   LINZESS  72 MCG capsule Take 1 capsule (72 mcg total) by mouth daily before breakfast. 30 capsule 3   midodrine  (PROAMATINE ) 5 MG tablet Take 1 tablet (5 mg total) by mouth 2 (two) times daily with a meal. 60 tablet 5   ondansetron  (ZOFRAN -ODT) 8 MG disintegrating tablet Dissolve 1 tablet (8 mg total) by mouth every 8 (eight) hours as needed. (Patient taking differently: Take 8 mg by mouth every 8 (eight) hours as needed for nausea or vomiting.) 15 tablet 5   oxyCODONE  (ROXICODONE ) 15 MG immediate release tablet Take 1 tablet (15 mg  total) by mouth every 4 (four) hours as needed for moderate pain (pain score 4-6) or severe pain (pain score 7-10). 45 tablet 0   OXYGEN  Inhale 5 L into the lungs continuous.       pantoprazole  (PROTONIX ) 40 MG tablet Take 1 tablet (40 mg total) by mouth daily. 30 tablet 5   polyethylene glycol (MIRALAX  / GLYCOLAX ) 17 g packet Take 17 g by mouth 2 (two) times daily. 60 each 4   predniSONE  (DELTASONE ) 5 MG tablet Take 1 tablet (5 mg total) by mouth in the morning. 30 tablet 5   rosuvastatin  (CRESTOR ) 20 MG tablet Take 1 tablet (20 mg total) by mouth daily. 90 tablet 1   senna-docusate (SENOKOT-S) 8.6-50 MG tablet Take 2 tablets by mouth at bedtime. 60 tablet 3   tamsulosin  (FLOMAX ) 0.4 MG CAPS capsule Take 1 capsule (0.4 mg total) by mouth daily after supper. 30 capsule 11   Vitamin D , Ergocalciferol , (DRISDOL ) 1.25 MG (50000 UNIT) CAPS capsule Take 1 capsule (50,000 Units total) by mouth every Sunday. 5 capsule 5            Blood pressure (!) 144/80, pulse 93, temperature 97.9 F (36.6 C), temperature source Oral, resp. rate 19, height 5\' 10"  (1.778 m), weight 125.6 kg, last menstrual period 05/17/2016, SpO2 95%. Physical Exam   General: No apparent distress HEENT: Head is normocephalic, atraumatic, sclera anicteric, oral mucosa pink and moist Neck: Supple without JVD or lymphadenopathy Heart: Reg rate and rhythm. No murmurs rubs or gallops Chest: CTA bilaterally without wheezes, rales, or rhonchi; no distress, 4L [place] o2 Abdomen: Soft, non-tender, protuberant, bowel sounds positive. Extremities: No clubbing, cyanosis, or edema. Pulses are 2+ Psych: Pt's affect is appropriate. Pt is cooperative Skin: Clean and intact without signs of breakdown GU: Foley draining yellow urine Neuro:  AAOx3 Follows commands, CN 2-12 grossly intact + tremor 4+/5 b/l UE Bilateral lower extremities Hip flexion 3  out of 5, knee extension 4-5, ankle PF and DF 4-5     Lab Results Last 24 Hours       Results  for orders placed or performed during the hospital encounter of 01/25/24 (from the past 24 hours)  Basic metabolic panel     Status: Abnormal    Collection Time: 02/03/24  5:49 AM  Result Value Ref Range    Sodium 139 135 - 145 mmol/L    Potassium 3.7 3.5 - 5.1 mmol/L    Chloride 95 (L) 98 - 111 mmol/L    CO2 35 (H) 22 - 32 mmol/L    Glucose, Bld 147 (H) 70 - 99 mg/dL    BUN <5 (L) 8 - 23 mg/dL    Creatinine, Ser 6.29 0.44 - 1.00 mg/dL    Calcium  8.5 (L) 8.9 - 10.3 mg/dL    GFR, Estimated >52 >84 mL/min    Anion gap 9 5 - 15    *Note: Due to a large number of results and/or encounters for the requested time period, some results have not been displayed. A complete set of results can be found in Results Review.      Imaging Results (Last 48 hours)  No results found.     Assessment/Plan: Diagnosis: L1,L3,L4 left pubic symphysis and R sacral alar fracture Does the need for close, 24 hr/day medical supervision in concert with the patient's rehab needs make it unreasonable for this patient to be served in a less intensive setting? Yes Co-Morbidities requiring supervision/potential complications:  -Sarcoid, interstitial lung disease, left femoral head AVN, UTI, anemia, Addison's disease, hypertension, hyperlipidemia, urinary retention, acute on chronic pain, anxiety and depression, morbid obesity Due to bladder management, bowel management, safety, skin/wound care, disease management, medication administration, pain management, and patient education, does the patient require 24 hr/day rehab nursing? Yes Does the patient require coordinated care of a physician, rehab nurse, therapy disciplines of Pt/OT to address physical and functional deficits in the context of the above medical diagnosis(es)? Yes Addressing deficits in the following areas: balance, endurance, locomotion, strength, transferring, bowel/bladder control, bathing, dressing, feeding, grooming, toileting, and psychosocial  support Can the patient actively participate in an intensive therapy program of at least 3 hrs of therapy per day at least 5 days per week? Yes The potential for patient to make measurable gains while on inpatient rehab is good Anticipated functional outcomes upon discharge from inpatient rehab are supervision  with PT, supervision and min assist with OT, supervision with SLP. Estimated rehab length of stay to reach the above functional goals is: 10-14 Anticipated discharge destination: Home Overall Rehab/Functional Prognosis: good   POST ACUTE RECOMMENDATIONS: This patient's condition is appropriate for continued rehabilitative care in the following setting: CIR Patient has agreed to participate in recommended program. Yes Note that insurance prior authorization may be required for reimbursement for recommended care.   Comment: I think pt would be a candidate for CIR, rehab coordinator to follow-up.          I have personally performed a face to face diagnostic evaluation of this patient. Additionally, I have examined the patient's medical record including any pertinent labs and radiographic images.     Thanks,   Lylia Sand, MD 02/03/2024

## 2024-02-04 ENCOUNTER — Other Ambulatory Visit (HOSPITAL_COMMUNITY): Payer: Self-pay

## 2024-02-04 DIAGNOSIS — M545 Low back pain, unspecified: Secondary | ICD-10-CM | POA: Diagnosis not present

## 2024-02-04 NOTE — Progress Notes (Signed)
 [facility] PROGRESS NOTE    Progress Note  [name]  ZOX:[id number] DOB: 30-Jul-1961 DOA: 01/25/2024 PCP: Bennet Brasil, MD     Brief Narrative:   Kathleen [name] is an 63 y.o. female past medical history of interstitial lung disease, fsarcoidosis, chronic respiratory failure with hypoxia on 5 L of oxygen  morbid obesity Addison's disease bilateral hip avascular necrosis lumbar back pain and lumbar compression fracture comes back to the hospital with worsening lumbar and pelvic pain after recently being discharged on 01/23/2024 for L1 compression fracture she followed up with Dr. [name] after recent hospitalization, as an outpatient Dr. [name] was scheduling her for kyphoplasty after pulmonary clearance, but returns due to intractable back pain she was found to have acute left pubic symphysis and right sacral alar fracture initially admitted to [facility] after discussion with IR was transferred to [facility] for L3 and L4 kyphoplasty.  IR recommended kyphoplasty after clearing from anesthesia team at [facility].Patient has a follow-up appointment with endocrinologist in 2 months.  Assessment/Plan:   Intractable acute on chronic Lower back pain due to osteoporotic fracture involving L1, L3 and L4/acute left pubic symphysis and right sacral alar fracture: Interventional radiology discussed with family patient does not want to go through with this kyphoplasty. Continue conservative management with narcotics and TLSO. Continue bowel regimen. We have had a long discussion about DNR/DNI she does not want to be intubated or resuscitated. PT evaluated suggested inpatient rehab, evaluated by inpatient rehab nurse patient does not appear to demonstrate medical necessity to justify inpatient rehab. Awaiting to hear from insurance approval for inpatient rehab.  Sarcoidosis/interstitial lung disease (HCC)/  Chronic respiratory failure with hypoxia (HCC): On 4 L of oxygen  and  hydrocortisone . At risk of CO2 retention.  Left femoral head avascular necrosis not a surgical candidate: Continue supportive care.  E. coli UTI: To complete a 3-day course of IV Rocephin .  Normocytic anemia: With a borderline MCV of 96 and RDW of 16. Check a B12 and folate. Start medication.  Addison's disease: Continue hydrocortisone .  Chronic hypotension: Continue midodrine .  Hyperlipidemia: Continue statins B  Hypothyroidism: Continue Synthroid .  Urinary retention: Continue Flomax .  Normocytic anemia: Follow-up PCP as an outpatient.  Acute on chronic pain syndrome: Continue current regimen.  Anxiety and depression: Continue buspirone  Cymbalta  and Atarax .  Morbid obesity: She has been counseled. Estimated body mass index is 39.75 kg/m as calculated from the following:   Height as of this encounter: 5\' 10"  (1.778 m).   Weight as of this encounter: 125.6 kg.  DVT prophylaxis: lovenox  Family Communication:none  Status is: Inpatient Remains inpatient appropriate because: Acute compression fracture    Code Status:     Code Status Orders  (From admission, onward)           Start     Ordered   01/26/24 0943  Full code  Continuous       Question:  By:  Answer:  Consent: discussion documented in EHR   01/26/24 0943           Code Status History     Date Active Date Inactive Code Status Order ID Comments User Context   01/26/2024 0425 01/26/2024 0943 Full Code [id number]  Twilla Galea, DO Inpatient   01/21/2024 0201 01/23/2024 2143 Full Code [id number]  Arnulfo Larch, MD ED   01/03/2024 2051 01/08/2024 1946 Full Code [id number]  Elgergawy, Ardia Kraft, MD ED   12/07/2023 2047 12/11/2023 1800 Full Code [id number]  Emokpae, Ejiroghene E,  MD ED   04/07/2023 0301 04/09/2023 2117 Full Code [id number]  Walton Guppy, MD ED   03/10/2023 1256 03/14/2023 2115 Full Code [id number]  Demaris Fillers, MD ED   12/12/2022 2147 12/15/2022 1706 Full Code [id number]  Pati Bonine, MD Inpatient   05/22/2021 0009 05/22/2021 2101 Full Code [id number]  Twilla Galea, DO ED   01/27/2020 0334 01/28/2020 1527 Full Code [id number]  Ozell Blunt, MD ED   11/04/2017 2153 11/06/2017 1614 Full Code [id number]  Ozell Blunt, MD Inpatient   10/21/2017 2047 10/22/2017 1631 Full Code [id number]  Vanita Gens, MD Inpatient   10/16/2017 0108 10/18/2017 1710 Full Code [id number]  Danice Dural, MD ED   08/17/2016 1917 08/22/2016 0402 Full Code [id number]  Malka Sea, DO Inpatient   12/28/2015 1624 01/02/2016 2154 Full Code [id number]  Lena Qualia, MD ED   01/16/2013 2209 01/21/2013 1520 Full Code [id number]  Maylene Spear, MD Inpatient         IV Access:   Peripheral IV   Procedures and diagnostic studies:   No results found.   Medical Consultants:   None.   Subjective:    Sherron Dom patient now asking for increasing pain medication.  Objective:    Vitals:   02/03/24 2000 02/04/24 0334 02/04/24 0742 02/04/24 0806  BP: (!) 116/49 (!) 118/50 (!) 121/58   Pulse: 85 89 96   Resp: 20 20 18    Temp: 97.8 F (36.6 C) 97.9 F (36.6 C) (!) 97.4 F (36.3 C)   TempSrc: Oral Oral Oral   SpO2: 98% 99% 95% 97%  Weight:      Height:       SpO2: 97 % O2 Flow Rate (L/min): 4 L/min FiO2 (%): 99 %   Intake/Output Summary (Last 24 hours) at 02/04/2024 0811 Last data filed at 02/04/2024 0615 Gross per 24 hour  Intake 637 ml  Output 2925 ml  Net -2288 ml   Filed Weights   01/25/24 1908  Weight: 125.6 kg    Exam: General exam: In no acute distress. Respiratory system: Good air movement and clear to auscultation. Cardiovascular system: S1 & S2 heard, RRR. No JVD. Gastrointestinal system: Abdomen is nondistended, soft and nontender.  Extremities: No pedal edema. Skin: No rashes, lesions or ulcers Psychiatry: Judgement and insight appear normal. Mood & affect appropriate.  Data Reviewed:    Labs: Basic Metabolic Panel: Recent Labs  Lab  01/29/24 0604 02/02/24 0539 02/03/24 0549  NA 137  --  139  K 3.8  --  3.7  CL 94*  --  95*  CO2 34*  --  35*  GLUCOSE 108*  --  147*  BUN 9  --  <5*  CREATININE 0.86 0.97 0.90  CALCIUM  8.8*  --  8.5*  MG 2.0  --   --   PHOS 4.1  --   --    GFR Estimated Creatinine Clearance: 94.6 mL/min (by C-G formula based on SCr of 0.9 mg/dL). Liver Function Tests: Recent Labs  Lab 01/29/24 0604  ALBUMIN 2.6*   No results for input(s): "LIPASE", "AMYLASE" in the last 168 hours. No results for input(s): "AMMONIA " in the last 168 hours. Coagulation profile No results for input(s): "INR", "PROTIME" in the last 168 hours. COVID-19 Labs  No results for input(s): "DDIMER", "FERRITIN", "LDH", "CRP" in the last 72 hours.   Lab Results  Component Value Date   SARSCOV2NAA NEGATIVE 08/05/2023   SARSCOV2NAA NEGATIVE 05/20/2023   SARSCOV2NAA  NEGATIVE 04/06/2023   SARSCOV2NAA NEGATIVE 03/10/2023    CBC: Recent Labs  Lab 01/29/24 0604  WBC 9.3  HGB 8.3*  HCT 27.3*  MCV 96.5  PLT 391   Cardiac Enzymes: No results for input(s): "CKTOTAL", "CKMB", "CKMBINDEX", "TROPONINI" in the last 168 hours. BNP (last 3 results) No results for input(s): "PROBNP" in the last 8760 hours. CBG: No results for input(s): "GLUCAP" in the last 168 hours. D-Dimer: No results for input(s): "DDIMER" in the last 72 hours. Hgb A1c: No results for input(s): "HGBA1C" in the last 72 hours. Lipid Profile: No results for input(s): "CHOL", "HDL", "LDLCALC", "TRIG", "CHOLHDL", "LDLDIRECT" in the last 72 hours. Thyroid  function studies: No results for input(s): "TSH", "T4TOTAL", "T3FREE", "THYROIDAB" in the last 72 hours.  Invalid input(s): "FREET3" Anemia work up: Recent Labs    02/01/24 1530  VITAMINB12 522  FOLATE 26.9    Sepsis Labs: Recent Labs  Lab 01/29/24 0604  WBC 9.3   Microbiology Recent Results (from the past 240 hours)  Urine Culture     Status: Abnormal   Collection Time: 01/25/24  11:08 PM   Specimen: Urine, Clean Catch  Result Value Ref Range Status   Specimen Description   Final    URINE, CLEAN CATCH Performed at [facility], [address], [place], [place] [address]    Special Requests   Final    NONE Performed at [facility], [address], [place], [place] [address]    Culture >=100,000 COLONIES/mL ESCHERICHIA COLI (A)  Final   Report Status 01/28/2024 FINAL  Final   Organism ID, Bacteria ESCHERICHIA COLI (A)  Final      Susceptibility   Escherichia coli - MIC*    AMPICILLIN 8 SENSITIVE Sensitive     CEFAZOLIN  <=4 SENSITIVE Sensitive     CEFEPIME  <=0.12 SENSITIVE Sensitive     CEFTRIAXONE  <=0.25 SENSITIVE Sensitive     CIPROFLOXACIN  <=0.25 SENSITIVE Sensitive     GENTAMICIN <=1 SENSITIVE Sensitive     IMIPENEM <=0.25 SENSITIVE Sensitive     NITROFURANTOIN <=16 SENSITIVE Sensitive     TRIMETH /SULFA  <=20 SENSITIVE Sensitive     AMPICILLIN/SULBACTAM <=2 SENSITIVE Sensitive     PIP/TAZO <=4 SENSITIVE Sensitive ug/mL    * >=100,000 COLONIES/mL ESCHERICHIA COLI     Medications:    arformoterol   15 mcg Nebulization BID   budesonide   0.5 mg Nebulization BID   busPIRone   15 mg Oral TID   Chlorhexidine  Gluconate Cloth  6 each Topical Daily   cyanocobalamin  1,000 mcg Subcutaneous Daily   cyclobenzaprine   5 mg Oral TID   DULoxetine   60 mg Oral Daily   enoxaparin  (LOVENOX ) injection  60 mg Subcutaneous Q24H   ferrous sulfate   325 mg Oral Weekly   folic acid  1 mg Oral Daily   furosemide   20 mg Oral Q M,W,F   hydrocortisone   10 mg Oral Q lunch   hydrocortisone   20 mg Oral Daily   levothyroxine   50 mcg Oral QAC breakfast   lidocaine   1 patch Transdermal Q24H   linaclotide   72 mcg Oral QAC breakfast   midodrine   5 mg Oral BID WC   pantoprazole   40 mg Oral Daily   predniSONE   5 mg Oral Q breakfast   prochlorperazine   10 mg Intravenous Once   revefenacin   175 mcg Nebulization Daily   rosuvastatin   20 mg Oral Daily   senna-docusate  2 tablet Oral  QHS   tamsulosin   0.4 mg Oral QPC supper  Vitamin D  (Ergocalciferol )  50,000 Units Oral Q Sun   Continuous Infusions:    LOS: 9 days   Macdonald Savoy  [facility]  02/04/2024, 8:11 AM

## 2024-02-04 NOTE — Progress Notes (Signed)
 Physical Therapy Treatment Patient Details Name: Kathleen [name] MRN: [id number] DOB: 1960-12-03 Today's Date: 02/04/2024   History of Present Illness 63 yo female presents to ED 5/31 for back pain. Pt with acute L pubic symphyseal and L sacral ala fx, also has L femoral head avascular necrosis but is not a surgical candidate. Pt also with previously noted superior endplate fractures at L1-L3 and L4. Pt and family declined kyphoplasty procedure, plan to manage conservatively at this time. PMH of chronic hypoxic respiratory failure (5L O2), ILD, sarcoidosis, Addison's disease, morbid obesity, bilateral hip AVN, osteoporosis with multiple vertebral fractures on chronic narcotics, and recent admissions for intractable pain due to compression fractures of L4 (4/12-4/16) and then L3 (5/9-5/14), readmitted on 01/20/2024 with acute L1 fracture, discharged home on 01/23/2024.    PT Comments  Pt sleeping soundly upon arrival to room, states she did not sleep overnight. Pt agreeable to PT session focused on functional mobility, though states today is "not a good day" for pain. Pt overall requiring light physical assist for transfer-level mobility with max cuing, once standing pt tolerating dynamic challenge but cannot step forward/back due to severe back and L groin pain. Pt requesting to return to supine, states "I will do better tomorrow, don't give up on me". PT to continue to follow.      If plan is discharge home, recommend the following: A lot of help with walking and/or transfers;A lot of help with bathing/dressing/bathroom;Help with stairs or ramp for entrance;Assistance with cooking/housework   Can travel by private vehicle     No  Equipment Recommendations  None recommended by PT    Recommendations for Other Services       Precautions / Restrictions Precautions Precautions: Fall Recall of Precautions/Restrictions: Intact Precaution/Restrictions Comments: TLSO brace (when  ambulating) Restrictions Weight Bearing Restrictions Per Provider Order: No     Mobility  Bed Mobility Overal bed mobility: Needs Assistance Bed Mobility: Rolling, Sidelying to Sit, Sit to Sidelying Rolling: Contact guard assist Sidelying to sit: Min assist, Used rails, HOB elevated     Sit to sidelying: Min assist, HOB elevated, Used rails General bed mobility comments: assist for log roll and LE management when moving in and out of sitting EOB. cues for sequencing and for back precautions    Transfers Overall transfer level: Needs assistance Equipment used: Rolling walker (2 wheels) Transfers: Sit to/from Stand Sit to Stand: Min assist           General transfer comment: assist for power up, steadying. standing marches x5 bilat, then pt exclaiming in pain and requesting return to supine.    Ambulation/Gait               General Gait Details: unable, pt in pain   Stairs             Wheelchair Mobility     Tilt Bed    Modified Rankin (Stroke Patients Only)       Balance Overall balance assessment: Needs assistance Sitting-balance support: Feet supported, No upper extremity supported Sitting balance-Leahy Scale: Fair Sitting balance - Comments: sitting EOB   Standing balance support: Reliant on assistive device for balance, During functional activity, Bilateral upper extremity supported Standing balance-Leahy Scale: Poor Standing balance comment: with RW support                            Communication Communication Communication: No apparent difficulties  Cognition Arousal: Alert Behavior During Therapy:  WFL for tasks assessed/performed   PT - Cognitive impairments: No apparent impairments                         Following commands: Intact      Cueing Cueing Techniques: Verbal cues, Tactile cues  Exercises General Exercises - Lower Extremity Hip Flexion/Marching: AROM, Seated, Both, 5 reps    General Comments         Pertinent Vitals/Pain Pain Assessment Pain Assessment: Faces Faces Pain Scale: Hurts even more Pain Location: back, L groin Pain Descriptors / Indicators: Guarding, Grimacing, Moaning, Sore, Crying Pain Intervention(s): Limited activity within patient's tolerance, Monitored during session, Repositioned    Home Living                          Prior Function            PT Goals (current goals can now be found in the care plan section) Acute Rehab PT Goals Patient Stated Goal: return home with family to assist PT Goal Formulation: With patient Time For Goal Achievement: 01/31/24 Potential to Achieve Goals: Good Progress towards PT goals: Progressing toward goals    Frequency    Min 3X/week      PT Plan      Co-evaluation              AM-PAC PT "6 Clicks" Mobility   Outcome Measure  Help needed turning from your back to your side while in a flat bed without using bedrails?: A Little Help needed moving from lying on your back to sitting on the side of a flat bed without using bedrails?: A Little Help needed moving to and from a bed to a chair (including a wheelchair)?: A Little Help needed standing up from a chair using your arms (e.g., wheelchair or bedside chair)?: A Little Help needed to walk in hospital room?: Total Help needed climbing 3-5 steps with a railing? : Total 6 Click Score: 14    End of Session Equipment Utilized During Treatment: Oxygen ;Back brace (TLSO) Activity Tolerance: Patient tolerated treatment well;Patient limited by pain Patient left: with call bell/phone within reach;in bed;Other (comment) (bed alarm malfunctioning and alarming even when on the lowest setting, PT reviewed pt is not to mobilize without assist and needs to press call button if needs OOB. Pt expresses understanding) Nurse Communication: Mobility status PT Visit Diagnosis: Unsteadiness on feet (R26.81);Other abnormalities of gait and mobility  (R26.89);Muscle weakness (generalized) (M62.81)     Time: 5284-1324 PT Time Calculation (min) (ACUTE ONLY): 17 min  Charges:    $Therapeutic Activity: 8-22 mins PT General Charges $$ ACUTE PT VISIT: 1 Visit                     Shirlene Doughty, PT DPT Acute Rehabilitation Services Secure Chat Preferred  Office [phone]    Abigale Dorow E Burnadette Carrion 02/04/2024, 10:58 AM

## 2024-02-04 NOTE — PMR Pre-admission (Signed)
 PMR Admission Coordinator Pre-Admission Assessment  Patient: Kathleen [name] is an 63 y.o., female MRN: [id number] DOB: 1961/01/17 Height: 5\' 10"  (177.8 cm) Weight: 125.6 kg              Insurance Information HMO: yes    PPO:      PCP:      IPA:      80/20:      OTHER:  PRIMARY: [employer]      Policy#: [id number]       Subscriber: Pt CM Name: ***      Phone#: ***     Fax#: [phone] Pre-Cert#: ***      Employer:  Benefits:  Phone #:[phone]-option 1, spoke with Clinton Danas Date: 08/28/2023 - 08/26/2024 Deductible: does not have one OOP Max: $3,400 ($1,641.58 met) CIR: $195/day co-pay for days 1-6, $0/day days 7-90 SNF:  $0/day co-pay for days 1-20, $214/day co-pay for days 21-100; limited to 100 days/benefit period Outpatient: $5/visit co-pay; limited by medical necessity Home Health:  100% coverage DME: 80% coverage; 20% co-insurance Providers: in network  SECONDARY:       Policy#:       Phone#:   Artist:       Phone#:   The Data processing manager" for patients in Inpatient Rehabilitation Facilities with attached "Privacy Act Statement-Health Care Records" was provided and verbally reviewed with: Patient  Emergency Contact Information Contact Information     Name Relation Home Work Mobile   [place] Spouse   [phone]   Belma Boxer Daughter   [phone]      Other Contacts     Name Relation Home Work Mobile   Lee,Justine Mother   [phone]      Current Medical History  Patient Admitting Diagnosis: Debility, sacral and pubic fxs History of Present Illness: Kathleen [name] is a 63 y.o. female with hx of chronic respiratory failure on 5 L, interstitial lung disease, sarcoidosis, Addison's disease, hypothyroidism, HLD, morbid obesity, osteoporosis with vertebral compression fractures, AVN hips, urinary retention, chronic pain syndrome, chronic anemia, anxiety and depression, who presents with acute worsening of lower  back pain. Reports she has been nonambulatory for about the past month since recent SNF stay. About 3 days prior to admission  Pt. Was was moving her legs and felt a pop in her back with acute worsening of pain. She presented to [facility] on 01/26/24 due to intractable pain  She reports having feeling like her leg is going to sleep sometimes, but does not seem that she has true numbness or weakness in LE, or incontinence. Pt. With recent She reports having feeling like her leg is going to sleep sometimes, but does not seem that she has true numbness or weakness in LE, or incontinence. Imaging revealed acute left pubic symphyseal and left sacral alar fracture as well as previous superior endplate fractures at L1-L3 and L4.  Left femoral head with avascular necrosis-not surgical candidate for this and family declined kyphoplasty procedure, plan to manage conservatively. Pt. Has a history of hypoxic respiratory failure and is on 5L of oxygen  at home, has required 3L during admission. She was seen by PT and OT and they recommend CIR to assist return to PLOF.   Glasgow Coma Scale Score: 15  Patient's medical record from [facility] has been reviewed by the rehabilitation admission coordinator and physician.  Past Medical History  Past Medical History:  Diagnosis Date   Adrenal insufficiency (HCC)  Anemia    Anxiety    Avascular bone necrosis (HCC)    Breast fibrocystic disorder    Chronic pain    Encounter for long-term opiate analgesic use 02/02/2020   Patient has chronic pain with femoral head avascular necrosis.  Is under pain contract   Fibromyalgia    Gastroesophageal reflux disease    Hypothyroidism    Mitral valve prolapse    Orthostatic hypotension    Peripheral neuropathy    PONV (postoperative nausea and vomiting)    Restrictive lung disease    Moderate to severe   Sarcoidosis    Biopsy proven - [facility]   Sarcoidosis    SOB (shortness of breath)     chronic    Has the patient had major surgery during 100 days prior to admission? No  Family History  family history includes Bipolar disorder in her daughter; Breast cancer in her mother; Cardiomyopathy in her mother; Heart attack in her maternal grandfather, maternal grandmother, and paternal grandmother; Heart disease in her maternal aunt; Neurofibromatosis in her cousin; Prostate cancer in her father.   Current Medications   Current Facility-Administered Medications:    acetaminophen  (TYLENOL ) tablet 650 mg, 650 mg, Oral, Q6H PRN, 650 mg at 02/04/24 0259 **OR** acetaminophen  (TYLENOL ) suppository 650 mg, 650 mg, Rectal, Q6H PRN, Lovell Rubenstein, NP   albuterol  (PROVENTIL ) (2.5 MG/3ML) 0.083% nebulizer solution 2.5 mg, 2.5 mg, Nebulization, Q4H PRN, Lovell Rubenstein, NP, 2.5 mg at 02/02/24 1402   arformoterol  (BROVANA ) nebulizer solution 15 mcg, 15 mcg, Nebulization, BID, Dewald, Jonathan B, MD, 15 mcg at 02/04/24 1914   budesonide  (PULMICORT ) nebulizer solution 0.5 mg, 0.5 mg, Nebulization, BID, Kathye Parkin L, NP, 0.5 mg at 02/04/24 7829   busPIRone  (BUSPAR ) tablet 15 mg, 15 mg, Oral, TID, Lovell Rubenstein, NP, 15 mg at 02/04/24 1115   Chlorhexidine  Gluconate Cloth 2 % PADS 6 each, 6 each, Topical, Daily, Gonfa, Taye T, MD, 6 each at 02/04/24 1216   cyanocobalamin (VITAMIN B12) injection 1,000 mcg, 1,000 mcg, Subcutaneous, Daily, Versa Gore, Fortunato Ill, MD, 1,000 mcg at 02/04/24 1116   cyclobenzaprine  (FLEXERIL ) tablet 5 mg, 5 mg, Oral, TID, Emokpae, Courage, MD, 5 mg at 02/04/24 1115   DULoxetine  (CYMBALTA ) DR capsule 60 mg, 60 mg, Oral, Daily, Lovell Rubenstein, NP, 60 mg at 02/04/24 1115   enoxaparin  (LOVENOX ) injection 60 mg, 60 mg, Subcutaneous, Q24H, Adefeso, Oladapo, DO, 60 mg at 02/04/24 1117   ferrous sulfate  tablet 325 mg, 325 mg, Oral, Weekly, Lovell Rubenstein, NP, 325 mg at 02/03/24 1028   folic acid (FOLVITE) tablet 1 mg, 1 mg, Oral, Daily, Macdonald Savoy, MD, 1 mg at  02/04/24 1116   furosemide  (LASIX ) tablet 20 mg, 20 mg, Oral, Q M,W,F, Kathye Parkin L, NP, 20 mg at 02/03/24 1028   hydrocortisone  (CORTEF ) tablet 10 mg, 10 mg, Oral, Q lunch, Kathye Parkin L, NP, 10 mg at 02/03/24 1244   hydrocortisone  (CORTEF ) tablet 20 mg, 20 mg, Oral, Daily, Kathye Parkin L, NP, 20 mg at 02/04/24 1114   HYDROmorphone  (DILAUDID ) injection 1 mg, 1 mg, Intravenous, Q3H PRN, Lovell Rubenstein, NP, 1 mg at 01/31/24 5621   hydrOXYzine  (ATARAX ) tablet 25 mg, 25 mg, Oral, TID PRN, Lovell Rubenstein, NP, 25 mg at 02/04/24 0329   lactulose  (CHRONULAC ) 10 GM/15ML solution 10 g, 10 g, Oral, Daily PRN, Lovell Rubenstein, NP   levothyroxine  (SYNTHROID ) tablet 50 mcg, 50 mcg, Oral, QAC breakfast, Lovell Rubenstein, NP, 50 mcg  at 02/04/24 8657   lidocaine  (LIDODERM ) 5 % 1 patch, 1 patch, Transdermal, Q24H, Lovell Rubenstein, NP, 1 patch at 02/04/24 0816   linaclotide  (LINZESS ) capsule 72 mcg, 72 mcg, Oral, QAC breakfast, Lovell Rubenstein, NP, 72 mcg at 02/04/24 8469   midodrine  (PROAMATINE ) tablet 5 mg, 5 mg, Oral, BID WC, Lovell Rubenstein, NP, 5 mg at 02/04/24 6295   ondansetron  (ZOFRAN ) tablet 4 mg, 4 mg, Oral, Q6H PRN, 4 mg at 02/03/24 1027 **OR** ondansetron  (ZOFRAN ) injection 4 mg, 4 mg, Intravenous, Q6H PRN, Lovell Rubenstein, NP, 4 mg at 01/26/24 2131   oxyCODONE  (Oxy IR/ROXICODONE ) immediate release tablet 30 mg, 30 mg, Oral, Q4H PRN, Feliz Ortiz, Abraham, MD, 30 mg at 02/04/24 1245   pantoprazole  (PROTONIX ) EC tablet 40 mg, 40 mg, Oral, Daily, Lovell Rubenstein, NP, 40 mg at 02/04/24 1116   prochlorperazine  (COMPAZINE ) injection 10 mg, 10 mg, Intravenous, Once, Emokpae, Courage, MD   revefenacin  (YUPELRI ) nebulizer solution 175 mcg, 175 mcg, Nebulization, Daily, Duaine German B, MD, 175 mcg at 02/04/24 0805   rosuvastatin  (CRESTOR ) tablet 20 mg, 20 mg, Oral, Daily, Kathye Parkin L, NP, 20 mg at 02/04/24 1114   senna-docusate (Senokot-S) tablet 2 tablet, 2 tablet, Oral, QHS, Lovell Rubenstein, NP, 2 tablet at 02/03/24 2001   tamsulosin  (FLOMAX ) capsule 0.4 mg, 0.4 mg, Oral, QPC supper, Kathye Parkin L, NP, 0.4 mg at 02/03/24 1756   Vitamin D  (Ergocalciferol ) (DRISDOL ) 1.25 MG (50000 UNIT) capsule 50,000 Units, 50,000 Units, Oral, Q Sun, Ellis, Allison L, NP, 50,000 Units at 02/02/24 2841  Patients Current Diet:  Diet Order             Diet regular Room service appropriate? Yes; Fluid consistency: Thin  Diet effective now                   Precautions / Restrictions Precautions Precautions: Fall Precaution/Restrictions Comments: TLSO brace (when ambulating) Restrictions Weight Bearing Restrictions Per Provider Order: No   Has the patient had 2 or more falls or a fall with injury in the past year?Yes  Prior Activity Level Limited Community (1-2x/wk): Pt. went out for appointments  Prior Functional Level Prior Function Prior Level of Function : Needs assist Physical Assist : Mobility (physical), ADLs (physical) Mobility (physical): Bed mobility, Transfers, Gait, Stairs ADLs (physical): Dressing, Bathing, Toileting, IADLs Mobility Comments: short household distances using RW, scooter ADLs Comments: Pt reports she had not been home long endough from rehab to know what she could do, She had been getting herself to [facility]  Self Care: Did the patient need help bathing, dressing, using the toilet or eating?  Needed some help  Indoor Mobility: Did the patient need assistance with walking from room to room (with or without device)? Needed some help  Stairs: Did the patient need assistance with internal or external stairs (with or without device)? Needed some help  Functional Cognition: Did the patient need help planning regular tasks such as shopping or remembering to take medications? Needed some help  Patient Information Are you of Hispanic, Latino/a,or Spanish origin?: A. No, not of Hispanic, Latino/a, or Spanish origin What is your race?: A. White Do you  need or want an interpreter to communicate with a doctor or health care staff?: 0. No  Patient's Response To:  Health Literacy and Transportation Is the patient able to respond to health literacy and transportation needs?: Yes Health Literacy - How often do you need to have someone help you  when you read instructions, pamphlets, or other written material from your doctor or pharmacy?: Never In the past 12 months, has lack of transportation kept you from medical appointments or from getting medications?: No In the past 12 months, has lack of transportation kept you from meetings, work, or from getting things needed for daily living?: No  Home Assistive Devices / Equipment Home Equipment: Agricultural consultant (2 wheels), Art gallery manager, Information systems manager, Higher education careers adviser held shower head, BSC/3in1  Prior Device Use: Indicate devices/aids used by the patient prior to current illness, exacerbation or injury? Walker  Current Functional Level Cognition  Orientation Level: Oriented X4    Extremity Assessment (includes Sensation/Coordination)  Upper Extremity Assessment: Overall WFL for tasks assessed (using functionally during session to optimize mobility and ADL)  Lower Extremity Assessment: Defer to PT evaluation    ADLs  Overall ADL's : Needs assistance/impaired Grooming: Contact guard assist, Minimal assistance, Sitting Grooming Details (indicate cue type and reason): reliant on UE for sitting EOB on air mattress Upper Body Bathing: Minimal assistance, Sitting Lower Body Bathing: Maximal assistance, Total assistance, Sitting/lateral leans Upper Body Dressing : Sitting, Minimal assistance Lower Body Dressing: Maximal assistance, Total assistance, Sitting/lateral leans Toilet Transfer: Minimal assistance, Stand-pivot, Rolling walker (2 wheels), Ambulation Toilet Transfer Details (indicate cue type and reason): or ambulation ~5 ft Toileting- Clothing Manipulation and Hygiene: Maximal assistance,  Sitting/lateral lean Functional mobility during ADLs: Minimal assistance, Rolling walker (2 wheels)    Mobility  Overal bed mobility: Needs Assistance Bed Mobility: Rolling, Sidelying to Sit, Sit to Sidelying Rolling: Contact guard assist Sidelying to sit: Min assist, Used rails, HOB elevated Supine to sit: HOB elevated, Min assist Sit to supine: Contact guard assist Sit to sidelying: Min assist, HOB elevated, Used rails General bed mobility comments: assist for log roll and LE management when moving in and out of sitting EOB. cues for sequencing and for back precautions    Transfers  Overall transfer level: Needs assistance Equipment used: Rolling walker (2 wheels) Transfers: Sit to/from Stand Sit to Stand: Min assist Bed to/from chair/wheelchair/BSC transfer type:: Step pivot Step pivot transfers: Min assist General transfer comment: assist for power up, steadying. standing marches x5 bilat, then pt exclaiming in pain and requesting return to supine.    Ambulation / Gait / Stairs / Wheelchair Mobility  Ambulation/Gait Ambulation/Gait assistance: Min assist, Mod assist Gait Distance (Feet): 6 Feet Assistive device: Rolling walker (2 wheels) Gait Pattern/deviations: Decreased step length - left, Decreased stance time - right, Decreased stride length, Step-to pattern, Wide base of support General Gait Details: unable, pt in pain Gait velocity: slow Pre-gait activities: marching at EOB    Posture / Balance Dynamic Sitting Balance Sitting balance - Comments: sitting EOB Balance Overall balance assessment: Needs assistance Sitting-balance support: Feet supported, No upper extremity supported Sitting balance-Leahy Scale: Fair Sitting balance - Comments: sitting EOB Standing balance support: Reliant on assistive device for balance, During functional activity, Bilateral upper extremity supported Standing balance-Leahy Scale: Poor Standing balance comment: with RW support     Special needs/care consideration Oxygen  *** and Skin ***     Previous Home Environment (from acute therapy documentation) Living Arrangements: Spouse/significant other  Lives With: Spouse Available Help at Discharge: Family, Available PRN/intermittently Type of Home: House Home Layout: One level Home Access: Stairs to enter Entrance Stairs-Rails: None Entrance Stairs-Number of Steps: 1 Bathroom Shower/Tub: Engineer, manufacturing systems: Handicapped height Bathroom Accessibility: Yes Home Care Services: No  Discharge Living Setting Plans for Discharge Living Setting: Patient's home  Type of Home at Discharge: House Discharge Home Layout: One level Discharge Home Access: Stairs to enter Entrance Stairs-Rails: None Entrance Stairs-Number of Steps: 1 Discharge Bathroom Shower/Tub: Tub/shower unit Discharge Bathroom Toilet: Handicapped height Discharge Bathroom Accessibility: Yes How Accessible: Accessible via walker Does the patient have any problems obtaining your medications?: No  Social/Family/Support Systems Patient Roles: Spouse Contact Information: [phone] Anticipated Caregiver: Ethlyn Alto Caregiver Availability: 24/7 Discharge Plan Discussed with Primary Caregiver: Yes Is Caregiver In Agreement with Plan?: Yes Does Caregiver/Family have Issues with Lodging/Transportation while Pt is in Rehab?: No   Goals Patient/Family Goal for Rehab: PT/OT Min A to supervision Expected length of stay: 12-14 days Pt/Family Agrees to Admission and willing to participate: Yes Program Orientation Provided & Reviewed with Pt/Caregiver Including Roles  & Responsibilities: Yes   Decrease burden of Care through IP rehab admission: Not anticipated   Possible need for SNF placement upon discharge:not anticipated   Patient Condition: I have reviewed medical records from [facility] , spoken with CM, and patient. I met with patient at the bedside for inpatient  rehabilitation assessment.  Patient will benefit from ongoing PT and OT, can actively participate in 3 hours of therapy a day 5 days of the week, and can make measurable gains during the admission.  Patient will also benefit from the coordinated team approach during an Inpatient Acute Rehabilitation admission.  The patient will receive intensive therapy as well as Rehabilitation physician, nursing, social worker, and care management interventions.  Due to safety, skin/wound care, disease management, medication administration, pain management, and patient education the patient requires 24 hour a day rehabilitation nursing.  The patient is currently min A with mobility and basic ADLs.  Discharge setting and therapy post discharge at home with home health is anticipated.  Patient has agreed to participate in the Acute Inpatient Rehabilitation Program and will admit {Time; today/tomorrow:10263}.  Preadmission Screen Completed By:  Dorena Gander, CCC-SLP, 02/04/2024 2:28 PM ______________________________________________________________________   Discussed status with Dr. Aaron Aason***at *** and received approval for admission today.  Admission Coordinator:  Dorena Gander, time***/Date***

## 2024-02-05 DIAGNOSIS — M545 Low back pain, unspecified: Secondary | ICD-10-CM | POA: Diagnosis not present

## 2024-02-05 DIAGNOSIS — S32030A Wedge compression fracture of third lumbar vertebra, initial encounter for closed fracture: Secondary | ICD-10-CM | POA: Diagnosis not present

## 2024-02-05 MED ORDER — SODIUM CHLORIDE 0.9 % IV SOLN
12.5000 mg | Freq: Once | INTRAVENOUS | Status: AC
  Administered 2024-02-05: 12.5 mg via INTRAVENOUS
  Filled 2024-02-05: qty 12.5

## 2024-02-05 MED ORDER — IRON SUCROSE 200 MG IVPB - SIMPLE MED
200.0000 mg | Freq: Once | Status: AC
  Administered 2024-02-05: 200 mg via INTRAVENOUS
  Filled 2024-02-05: qty 200

## 2024-02-05 NOTE — Progress Notes (Signed)
 Occupational Therapy Treatment Patient Details Name: Kathleen [name] MRN: [id number] DOB: May 30, 1961 Today's Date: 02/05/2024   History of present illness 63 yo female presents to ED 5/31 for back pain. Pt with acute L pubic symphyseal and L sacral ala fx, also has L femoral head avascular necrosis but is not a surgical candidate. Pt also with previously noted superior endplate fractures at L1-L3 and L4. Pt and family declined kyphoplasty procedure, plan to manage conservatively at this time. PMH of chronic hypoxic respiratory failure (5L O2), ILD, sarcoidosis, Addison's disease, morbid obesity, bilateral hip AVN, osteoporosis with multiple vertebral fractures on chronic narcotics, and recent admissions for intractable pain due to compression fractures of L4 (4/12-4/16) and then L3 (5/9-5/14), readmitted on 01/20/2024 with acute L1 fracture, discharged home on 01/23/2024.   OT comments  Pt progressing well towards goals. Pt received supine on EOB, requiring min assist to come to sit. Max encouragement required for pt to participate and receive pericare d/t pain and anxiety regarding movement. Total assist for hygiene, however progressed step pivot to CGA. Continue to recommend >3 hours of skilled rehab daily to optimize independence levels. Will continue to follow acutely.       If plan is discharge home, recommend the following:  A lot of help with walking and/or transfers;A lot of help with bathing/dressing/bathroom;Assistance with cooking/housework;Help with stairs or ramp for entrance;Assist for transportation   Equipment Recommendations  None recommended by OT    Recommendations for Other Services      Precautions / Restrictions Precautions Precautions: Fall Recall of Precautions/Restrictions: Intact Precaution/Restrictions Comments: TLSO brace (when ambulating) Restrictions Weight Bearing Restrictions Per Provider Order: No       Mobility Bed Mobility Overal bed mobility: Needs  Assistance       Supine to sit: Min assist, Used rails     General bed mobility comments: pt received EOB lying on back, min assist for trunk to pull to sitting    Transfers Overall transfer level: Needs assistance Equipment used: Rolling walker (2 wheels) Transfers: Sit to/from Stand, Bed to chair/wheelchair/BSC Sit to Stand: Mod assist, +2 physical assistance, From elevated surface     Step pivot transfers: Contact guard assist     General transfer comment: +2 to come to stand, once in standing CGA to complete SPT     Balance Overall balance assessment: Needs assistance Sitting-balance support: Feet supported, No upper extremity supported Sitting balance-Leahy Scale: Fair Sitting balance - Comments: sitting EOB   Standing balance support: Reliant on assistive device for balance, During functional activity, Bilateral upper extremity supported Standing balance-Leahy Scale: Poor Standing balance comment: with RW support     ADL either performed or assessed with clinical judgement   ADL Overall ADL's : Needs assistance/impaired Eating/Feeding: Set up;Sitting   Toilet Transfer: Contact guard assist;Stand-pivot Toilet Transfer Details (indicate cue type and reason): inital +2 to come to stand, once in standing SPT with CGA Toileting- Clothing Manipulation and Hygiene: Total assistance;+2 for physical assistance;+2 for safety/equipment Toileting - Clothing Manipulation Details (indicate cue type and reason): +2 to come to stand and complete hygiene standing     Functional mobility during ADLs: Contact guard assist General ADL Comments: Pt anxious regarding self-care tasks, and requires encouragement to complete tasks    Extremity/Trunk Assessment Upper Extremity Assessment Upper Extremity Assessment: Overall WFL for tasks assessed   Lower Extremity Assessment Lower Extremity Assessment: Defer to PT evaluation        Vision   Vision Assessment?: No apparent visual  deficits         Communication Communication Communication: No apparent difficulties   Cognition Arousal: Alert Behavior During Therapy: Anxious Cognition: No apparent impairments     Following commands: Intact        Cueing   Cueing Techniques: Verbal cues, Tactile cues        General Comments NT and RN initally present for session    Pertinent Vitals/ Pain       Pain Assessment Pain Assessment: Faces Faces Pain Scale: Hurts whole lot Pain Location: back Pain Descriptors / Indicators: Guarding, Grimacing, Moaning, Sore, Crying Pain Intervention(s): RN gave pain meds during session   Frequency  Min 2X/week        Progress Toward Goals  OT Goals(current goals can now be found in the care plan section)  Progress towards OT goals: Progressing toward goals  Acute Rehab OT Goals Patient Stated Goal: To go home OT Goal Formulation: With patient Time For Goal Achievement: 02/15/24 Potential to Achieve Goals: Good ADL Goals Pt Will Perform Grooming: with supervision;standing Pt Will Perform Upper Body Dressing: with modified independence Pt Will Perform Lower Body Dressing: with supervision;sit to/from stand;with adaptive equipment Pt Will Transfer to Toilet: with modified independence;ambulating Pt Will Perform Toileting - Clothing Manipulation and hygiene: with min assist Pt/caregiver will Perform Home Exercise Program: Increased strength;Both right and left upper extremity;Independently  Plan      Co-evaluation    PT/OT/SLP Co-Evaluation/Treatment: Yes Reason for Co-Treatment: For patient/therapist safety;To address functional/ADL transfers PT goals addressed during session: Mobility/safety with mobility;Balance OT goals addressed during session: ADL's and self-care      AM-PAC OT 6 Clicks Daily Activity     Outcome Measure   Help from another person eating meals?: A Little Help from another person taking care of personal grooming?: A Little Help  from another person toileting, which includes using toliet, bedpan, or urinal?: A Lot Help from another person bathing (including washing, rinsing, drying)?: A Lot Help from another person to put on and taking off regular upper body clothing?: A Little Help from another person to put on and taking off regular lower body clothing?: Total 6 Click Score: 14    End of Session Equipment Utilized During Treatment: Rolling walker (2 wheels);Gait belt;Oxygen  (5L)  OT Visit Diagnosis: Unsteadiness on feet (R26.81);Other abnormalities of gait and mobility (R26.89);Muscle weakness (generalized) (M62.81)   Activity Tolerance Patient limited by pain   Patient Left in chair;with call bell/phone within reach;with chair alarm set   Nurse Communication Mobility status        Time: 0937-1000 OT Time Calculation (min): 23 min  Charges: OT General Charges $OT Visit: 1 Visit OT Treatments $Self Care/Home Management : 8-22 mins  Delmer Ferraris, OT  Acute Rehabilitation Services Office [phone] Secure chat preferred   Mickael Alamo 02/05/2024, 1:22 PM

## 2024-02-05 NOTE — Progress Notes (Signed)
 [facility] PROGRESS NOTE    Progress Note  Kathleen [name]  WUJ:[id number] DOB: [birth year] DOA: 01/25/2024 PCP: Bennet Brasil, MD     Brief Narrative:   Kathleen [name] is an 63 y.o. female past medical history of interstitial lung disease, fsarcoidosis, chronic respiratory failure with hypoxia on 5 L of oxygen  morbid obesity Addison's disease bilateral hip avascular necrosis lumbar back pain and lumbar compression fracture comes back to the hospital with worsening lumbar and pelvic pain after recently being discharged on 01/23/2024 for L1 compression fracture she followed up with Dr. [name] after recent hospitalization, as an outpatient Dr. [name] was scheduling her for kyphoplasty after pulmonary clearance, but returns due to intractable back pain she was found to have acute left pubic symphysis and right sacral alar fracture initially admitted to [facility] after discussion with IR was transferred to [facility] for L3 and L4 kyphoplasty.  IR recommended kyphoplasty after clearing from anesthesia team at [facility].Patient has a follow-up appointment with endocrinologist in 2 months.  Assessment/Plan:   Intractable acute on chronic Lower back pain due to osteoporotic fracture involving L1, L3 and L4/acute left pubic symphysis and right sacral alar fracture: Interventional radiology discussed with family and patient, and patient does not want to proceed with kyphoplasty. Continue conservative management with narcotics and TLSO. Continue bowel regimen. We have had a long discussion about DNR/DNI she does not want to be intubated or resuscitated. PT recommended inpatient rehab, evaluated by inpatient rehab nurse patient does not appear to demonstrate medical necessity to justify inpatient rehab. Awaiting to hear from insurance inpt rehab approval   Sarcoidosis/interstitial lung disease (HCC)/Chronic respiratory failure with hypoxia (HCC): On 4 L of oxygen  and hydrocortisone . At  risk of CO2 retention.  Left femoral head avascular necrosis not a surgical candidate: Continue supportive care.  E. coli UTI: S/p 3-day course of IV Rocephin .  Normocytic anemia: With a borderline MCV of 96 and RDW of 16. B12 and folate WNL Iron/TIBC/Ferritin/ %Sat    Component Value Date/Time   IRON 39 01/26/2024 1106   IRON 58 01/13/2016 1038   TIBC 308 01/26/2024 1106   TIBC 286 01/13/2016 1038   FERRITIN 19 01/26/2024 1106   FERRITIN 109 01/13/2016 1038   IRONPCTSAT 13 01/26/2024 1106   IRONPCTSAT 20 01/13/2016 1038   IRONPCTSAT 12 (L) 09/01/2014 1334  Continue Fe supplementation   Addison's disease: Continue hydrocortisone .  Chronic hypotension: Continue midodrine .  Hyperlipidemia: Continue statins B  Hypothyroidism: Continue Synthroid .  Urinary retention: Continue Flomax . Foley in place since 6/3.  Remains in place for urinary retention   Normocytic anemia: Follow-up PCP as an outpatient.  Acute on chronic pain syndrome: Continue current regimen.  Anxiety and depression: Continue buspirone  Cymbalta  and Atarax .  Morbid obesity: She has been counseled. Estimated body mass index is 39.75 kg/m as calculated from the following:   Height as of this encounter: 5' 10 (1.778 m).   Weight as of this encounter: 125.6 kg.  DVT prophylaxis: lovenox  Family Communication:none  Status is: Inpatient Remains inpatient appropriate because: Acute compression fracture    Code Status:     Code Status Orders  (From admission, onward)           Start     Ordered   01/26/24 0943  Full code  Continuous       Question:  By:  Answer:  Consent: discussion documented in EHR   01/26/24 [phone]  Code Status History     Date Active Date Inactive Code Status Order ID Comments User Context   01/26/2024 0425 01/26/2024 0943 Full Code [id number]  Twilla Galea, DO Inpatient   01/21/2024 0201 01/23/2024 2143 Full Code [id number]  Arnulfo Larch, MD ED    01/03/2024 2051 01/08/2024 1946 Full Code [id number]  Elgergawy, Ardia Kraft, MD ED   12/07/2023 2047 12/11/2023 1800 Full Code [id number]  Pati Bonine, MD ED   04/07/2023 0301 04/09/2023 2117 Full Code [id number]  Walton Guppy, MD ED   03/10/2023 1256 03/14/2023 2115 Full Code [id number]  Demaris Fillers, MD ED   12/12/2022 2147 12/15/2022 1706 Full Code [id number]  Pati Bonine, MD Inpatient   05/22/2021 0009 05/22/2021 2101 Full Code [id number]  Twilla Galea, DO ED   01/27/2020 0334 01/28/2020 1527 Full Code [id number]  Ozell Blunt, MD ED   11/04/2017 2153 11/06/2017 1614 Full Code [id number]  Ozell Blunt, MD Inpatient   10/21/2017 2047 10/22/2017 1631 Full Code [id number]  Vanita Gens, MD Inpatient   10/16/2017 0108 10/18/2017 1710 Full Code [id number]  Danice Dural, MD ED   08/17/2016 1917 08/22/2016 0402 Full Code [id number]  Malka Sea, DO Inpatient   12/28/2015 1624 01/02/2016 2154 Full Code [id number]  Lena Qualia, MD ED   01/16/2013 2209 01/21/2013 1520 Full Code [id number]  Maylene Spear, MD Inpatient         IV Access:   Peripheral IV   Procedures and diagnostic studies:   No results found.   Medical Consultants:   None.   Subjective:    Kathleen [name] feels pain is stable. She is having some dyspnea while at rest. Able to tolerate sitting on bedside commode.   Objective:    Vitals:   02/04/24 2114 02/04/24 2130 02/05/24 0439 02/05/24 0829  BP:  107/63 (!) 128/53 (!) 120/58  Pulse: 79 95 94 98  Resp: 18 20    Temp:  98 F (36.7 C) 98.2 F (36.8 C)   TempSrc:  Oral Oral   SpO2: 98% 97% 99% 96%  Weight:      Height:       SpO2: 96 % O2 Flow Rate (L/min): 5 L/min FiO2 (%): 99 %   Intake/Output Summary (Last 24 hours) at 02/05/2024 0918 Last data filed at 02/05/2024 0500 Gross per 24 hour  Intake 240 ml  Output 1650 ml  Net -1410 ml   Filed Weights   01/25/24 1908  Weight: 125.6 kg     Physical Exam  Constitutional: In no distress.   Cardiovascular: Normal rate, regular rhythm. No lower extremity edema  Pulmonary: Non labored breathing on room air, no wheezing or rales.   Abdominal: Soft. Obese. Non distended and non tender Musculoskeletal: Normal range of motion.     Neurological: Alert and oriented to person, place, and time.  Skin: Skin is warm and dry.    Data Reviewed:    Labs: Basic Metabolic Panel: Recent Labs  Lab 02/02/24 0539 02/03/24 0549  NA  --  139  K  --  3.7  CL  --  95*  CO2  --  35*  GLUCOSE  --  147*  BUN  --  <5*  CREATININE 0.97 0.90  CALCIUM   --  8.5*   GFR Estimated Creatinine Clearance: 94.6 mL/min (by C-G formula based on SCr of 0.9 mg/dL). Liver Function Tests: No results for input(s): AST, ALT, ALKPHOS, BILITOT, PROT, ALBUMIN in the last 168 hours.  No results for input(s): LIPASE, AMYLASE in the last 168 hours. No results for input(s): AMMONIA  in the last 168 hours. Coagulation profile No results for input(s): INR, PROTIME in the last 168 hours. COVID-19 Labs  No results for input(s): DDIMER, FERRITIN, LDH, CRP in the last 72 hours.   Lab Results  Component Value Date   SARSCOV2NAA NEGATIVE 08/05/2023   SARSCOV2NAA NEGATIVE 05/20/2023   SARSCOV2NAA NEGATIVE 04/06/2023   SARSCOV2NAA NEGATIVE 03/10/2023    CBC: No results for input(s): WBC, NEUTROABS, HGB, HCT, MCV, PLT in the last 168 hours.  Cardiac Enzymes: No results for input(s): CKTOTAL, CKMB, CKMBINDEX, TROPONINI in the last 168 hours. BNP (last 3 results) No results for input(s): PROBNP in the last 8760 hours. CBG: No results for input(s): GLUCAP in the last 168 hours. D-Dimer: No results for input(s): DDIMER in the last 72 hours. Hgb A1c: No results for input(s): HGBA1C in the last 72 hours. Lipid Profile: No results for input(s): CHOL, HDL, LDLCALC, TRIG, CHOLHDL, LDLDIRECT in the last 72 hours. Thyroid  function studies: No  results for input(s): TSH, T4TOTAL, T3FREE, THYROIDAB in the last 72 hours.  Invalid input(s): FREET3 Anemia work up: No results for input(s): VITAMINB12, FOLATE, FERRITIN, TIBC, IRON, RETICCTPCT in the last 72 hours.   Sepsis Labs: No results for input(s): PROCALCITON, WBC, LATICACIDVEN in the last 168 hours.  Microbiology No results found for this or any previous visit (from the past 240 hours).    Medications:    arformoterol   15 mcg Nebulization BID   budesonide   0.5 mg Nebulization BID   busPIRone   15 mg Oral TID   Chlorhexidine  Gluconate Cloth  6 each Topical Daily   cyanocobalamin  1,000 mcg Subcutaneous Daily   cyclobenzaprine   5 mg Oral TID   DULoxetine   60 mg Oral Daily   enoxaparin  (LOVENOX ) injection  60 mg Subcutaneous Q24H   ferrous sulfate   325 mg Oral Weekly   folic acid  1 mg Oral Daily   furosemide   20 mg Oral Q M,W,F   hydrocortisone   10 mg Oral Q lunch   hydrocortisone   20 mg Oral Daily   levothyroxine   50 mcg Oral QAC breakfast   lidocaine   1 patch Transdermal Q24H   linaclotide   72 mcg Oral QAC breakfast   midodrine   5 mg Oral BID WC   pantoprazole   40 mg Oral Daily   prochlorperazine   10 mg Intravenous Once   revefenacin   175 mcg Nebulization Daily   rosuvastatin   20 mg Oral Daily   senna-docusate  2 tablet Oral QHS   tamsulosin   0.4 mg Oral QPC supper   Vitamin D  (Ergocalciferol )  50,000 Units Oral Q Sun   Continuous Infusions:  iron sucrose        LOS: 10 days   [employer]  [facility]  02/05/2024, 9:18 AM

## 2024-02-05 NOTE — Progress Notes (Signed)
 Physical Therapy Treatment Patient Details Name: Kathleen [name] MRN: [id number] DOB: Jun 23, 1961 Today's Date: 02/05/2024   History of Present Illness 63 yo female presents to ED 5/31 for back pain. Pt with acute L pubic symphyseal and L sacral ala fx, also has L femoral head avascular necrosis but is not a surgical candidate. Pt also with previously noted superior endplate fractures at L1-L3 and L4. Pt and family declined kyphoplasty procedure, plan to manage conservatively at this time. PMH of chronic hypoxic respiratory failure (5L O2), ILD, sarcoidosis, Addison's disease, morbid obesity, bilateral hip AVN, osteoporosis with multiple vertebral fractures on chronic narcotics, and recent admissions for intractable pain due to compression fractures of L4 (4/12-4/16) and then L3 (5/9-5/14), readmitted on 01/20/2024 with acute L1 fracture, discharged home on 01/23/2024.    PT Comments  Pt received sitting EOB with NT and OT after BM. Pt requires increased assist to stand this session due to back pain. Pt requires total A for pericare and demonstrates limited standing tolerance requiring a seated rest break prior to transfer. Pt able to pivot to recliner, however sits quickly and requires cues for safety. Pt reports increased nausea during session, RN aware. Pt continues to benefit from PT services to progress toward functional mobility goals.     If plan is discharge home, recommend the following: A lot of help with walking and/or transfers;A lot of help with bathing/dressing/bathroom;Help with stairs or ramp for entrance;Assistance with cooking/housework   Can travel by private vehicle     No  Equipment Recommendations  None recommended by PT    Recommendations for Other Services       Precautions / Restrictions Precautions Precautions: Fall Recall of Precautions/Restrictions: Intact Precaution/Restrictions Comments: TLSO brace (when ambulating) Restrictions Weight Bearing Restrictions Per  Provider Order: No     Mobility  Bed Mobility               General bed mobility comments: Pt sitting EOB upon arrival    Transfers Overall transfer level: Needs assistance Equipment used: Rolling walker (2 wheels) Transfers: Sit to/from Stand, Bed to chair/wheelchair/BSC Sit to Stand: Mod assist, +2 physical assistance, From elevated surface   Step pivot transfers: Contact guard assist       General transfer comment: STS from EOB with mod A +2 for power up and stability. pivot to recliner with CGA for safety and cues for safety    Ambulation/Gait               General Gait Details: unable to progress   Stairs             Wheelchair Mobility     Tilt Bed    Modified Rankin (Stroke Patients Only)       Balance Overall balance assessment: Needs assistance Sitting-balance support: Feet supported, No upper extremity supported Sitting balance-Leahy Scale: Fair Sitting balance - Comments: sitting EOB   Standing balance support: Reliant on assistive device for balance, During functional activity, Bilateral upper extremity supported Standing balance-Leahy Scale: Poor Standing balance comment: with RW support                            Communication Communication Communication: No apparent difficulties  Cognition Arousal: Alert Behavior During Therapy: WFL for tasks assessed/performed   PT - Cognitive impairments: No apparent impairments  Following commands: Intact      Cueing Cueing Techniques: Verbal cues, Tactile cues  Exercises      General Comments        Pertinent Vitals/Pain Pain Assessment Pain Assessment: Faces Faces Pain Scale: Hurts whole lot Pain Location: back Pain Descriptors / Indicators: Guarding, Grimacing, Moaning, Sore, Crying Pain Intervention(s): Limited activity within patient's tolerance, Monitored during session, Repositioned     PT Goals (current goals can now  be found in the care plan section) Acute Rehab PT Goals Patient Stated Goal: return home with family to assist PT Goal Formulation: With patient Time For Goal Achievement: 01/31/24 Progress towards PT goals: Progressing toward goals (slowly)    Frequency    Min 3X/week           Co-evaluation PT/OT/SLP Co-Evaluation/Treatment: Yes Reason for Co-Treatment: For patient/therapist safety;To address functional/ADL transfers PT goals addressed during session: Mobility/safety with mobility;Balance        AM-PAC PT 6 Clicks Mobility   Outcome Measure  Help needed turning from your back to your side while in a flat bed without using bedrails?: A Little Help needed moving from lying on your back to sitting on the side of a flat bed without using bedrails?: A Little Help needed moving to and from a bed to a chair (including a wheelchair)?: A Little Help needed standing up from a chair using your arms (e.g., wheelchair or bedside chair)?: A Lot Help needed to walk in hospital room?: Total Help needed climbing 3-5 steps with a railing? : Total 6 Click Score: 13    End of Session Equipment Utilized During Treatment: Gait belt;Oxygen  Activity Tolerance: Patient limited by pain Patient left: in chair;with call bell/phone within reach Nurse Communication: Mobility status PT Visit Diagnosis: Unsteadiness on feet (R26.81);Other abnormalities of gait and mobility (R26.89);Muscle weakness (generalized) (M62.81)     Time: 0945-1000 PT Time Calculation (min) (ACUTE ONLY): 15 min  Charges:    $Therapeutic Activity: 8-22 mins PT General Charges $$ ACUTE PT VISIT: 1 Visit                     Michaelle Adolphus, PTA Acute Rehabilitation Services Secure Chat Preferred  [phone]    Michaelle Adolphus 02/05/2024, 10:45 AM

## 2024-02-05 NOTE — Progress Notes (Signed)
 Inpatient Rehab Admissions Coordinator:    I received insurance auth for CIR. I do not have a bed today. Hopeful we will have one for her tomorrow.   Wandalee Gust, MS, CCC-SLP Rehab Admissions Coordinator  [phone] (celll) [phone] (office)

## 2024-02-05 NOTE — Plan of Care (Signed)

## 2024-02-06 ENCOUNTER — Ambulatory Visit: Admitting: "Endocrinology

## 2024-02-06 ENCOUNTER — Encounter (HOSPITAL_COMMUNITY): Payer: Self-pay | Admitting: Physical Medicine and Rehabilitation

## 2024-02-06 ENCOUNTER — Inpatient Hospital Stay (HOSPITAL_COMMUNITY)
Admission: AD | Admit: 2024-02-06 | Discharge: 2024-02-25 | DRG: 560 | Disposition: A | Discharge status: Home Health | Source: Intra-hospital | Attending: Physical Medicine and Rehabilitation | Admitting: Physical Medicine and Rehabilitation

## 2024-02-06 ENCOUNTER — Other Ambulatory Visit: Payer: Self-pay

## 2024-02-06 DIAGNOSIS — F32A Depression, unspecified: Secondary | ICD-10-CM | POA: Diagnosis present

## 2024-02-06 DIAGNOSIS — S32119D Unspecified Zone I fracture of sacrum, subsequent encounter for fracture with routine healing: Secondary | ICD-10-CM

## 2024-02-06 DIAGNOSIS — Z8249 Family history of ischemic heart disease and other diseases of the circulatory system: Secondary | ICD-10-CM

## 2024-02-06 DIAGNOSIS — D649 Anemia, unspecified: Secondary | ICD-10-CM | POA: Diagnosis not present

## 2024-02-06 DIAGNOSIS — E271 Primary adrenocortical insufficiency: Secondary | ICD-10-CM | POA: Diagnosis present

## 2024-02-06 DIAGNOSIS — S32000D Wedge compression fracture of unspecified lumbar vertebra, subsequent encounter for fracture with routine healing: Secondary | ICD-10-CM | POA: Diagnosis not present

## 2024-02-06 DIAGNOSIS — J849 Interstitial pulmonary disease, unspecified: Secondary | ICD-10-CM | POA: Diagnosis not present

## 2024-02-06 DIAGNOSIS — S32039D Unspecified fracture of third lumbar vertebra, subsequent encounter for fracture with routine healing: Secondary | ICD-10-CM | POA: Diagnosis not present

## 2024-02-06 DIAGNOSIS — Z8042 Family history of malignant neoplasm of prostate: Secondary | ICD-10-CM

## 2024-02-06 DIAGNOSIS — D869 Sarcoidosis, unspecified: Secondary | ICD-10-CM | POA: Diagnosis not present

## 2024-02-06 DIAGNOSIS — S129XXS Fracture of neck, unspecified, sequela: Secondary | ICD-10-CM | POA: Diagnosis not present

## 2024-02-06 DIAGNOSIS — E66813 Obesity, class 3: Secondary | ICD-10-CM | POA: Diagnosis not present

## 2024-02-06 DIAGNOSIS — S32000G Wedge compression fracture of unspecified lumbar vertebra, subsequent encounter for fracture with delayed healing: Secondary | ICD-10-CM

## 2024-02-06 DIAGNOSIS — I341 Nonrheumatic mitral (valve) prolapse: Secondary | ICD-10-CM | POA: Diagnosis present

## 2024-02-06 DIAGNOSIS — K5900 Constipation, unspecified: Secondary | ICD-10-CM | POA: Diagnosis not present

## 2024-02-06 DIAGNOSIS — M879 Osteonecrosis, unspecified: Secondary | ICD-10-CM | POA: Diagnosis present

## 2024-02-06 DIAGNOSIS — F418 Other specified anxiety disorders: Secondary | ICD-10-CM | POA: Diagnosis not present

## 2024-02-06 DIAGNOSIS — M545 Low back pain, unspecified: Secondary | ICD-10-CM | POA: Diagnosis not present

## 2024-02-06 DIAGNOSIS — N390 Urinary tract infection, site not specified: Secondary | ICD-10-CM | POA: Diagnosis not present

## 2024-02-06 DIAGNOSIS — R079 Chest pain, unspecified: Secondary | ICD-10-CM | POA: Diagnosis not present

## 2024-02-06 DIAGNOSIS — R7989 Other specified abnormal findings of blood chemistry: Secondary | ICD-10-CM | POA: Diagnosis not present

## 2024-02-06 DIAGNOSIS — K5909 Other constipation: Secondary | ICD-10-CM | POA: Diagnosis present

## 2024-02-06 DIAGNOSIS — D860 Sarcoidosis of lung: Secondary | ICD-10-CM | POA: Diagnosis not present

## 2024-02-06 DIAGNOSIS — R0602 Shortness of breath: Secondary | ICD-10-CM | POA: Diagnosis not present

## 2024-02-06 DIAGNOSIS — K219 Gastro-esophageal reflux disease without esophagitis: Secondary | ICD-10-CM | POA: Diagnosis present

## 2024-02-06 DIAGNOSIS — R1310 Dysphagia, unspecified: Secondary | ICD-10-CM | POA: Diagnosis present

## 2024-02-06 DIAGNOSIS — Z803 Family history of malignant neoplasm of breast: Secondary | ICD-10-CM

## 2024-02-06 DIAGNOSIS — Z91030 Bee allergy status: Secondary | ICD-10-CM

## 2024-02-06 DIAGNOSIS — E039 Hypothyroidism, unspecified: Secondary | ICD-10-CM | POA: Diagnosis not present

## 2024-02-06 DIAGNOSIS — R32 Unspecified urinary incontinence: Secondary | ICD-10-CM | POA: Diagnosis present

## 2024-02-06 DIAGNOSIS — M4850XA Collapsed vertebra, not elsewhere classified, site unspecified, initial encounter for fracture: Principal | ICD-10-CM | POA: Diagnosis present

## 2024-02-06 DIAGNOSIS — M549 Dorsalgia, unspecified: Secondary | ICD-10-CM | POA: Diagnosis present

## 2024-02-06 DIAGNOSIS — I9589 Other hypotension: Secondary | ICD-10-CM | POA: Diagnosis present

## 2024-02-06 DIAGNOSIS — M8008XD Age-related osteoporosis with current pathological fracture, vertebra(e), subsequent encounter for fracture with routine healing: Secondary | ICD-10-CM

## 2024-02-06 DIAGNOSIS — Z6839 Body mass index (BMI) 39.0-39.9, adult: Secondary | ICD-10-CM

## 2024-02-06 DIAGNOSIS — Z7952 Long term (current) use of systemic steroids: Secondary | ICD-10-CM

## 2024-02-06 DIAGNOSIS — N3000 Acute cystitis without hematuria: Secondary | ICD-10-CM | POA: Diagnosis not present

## 2024-02-06 DIAGNOSIS — S32019D Unspecified fracture of first lumbar vertebra, subsequent encounter for fracture with routine healing: Principal | ICD-10-CM

## 2024-02-06 DIAGNOSIS — M797 Fibromyalgia: Secondary | ICD-10-CM | POA: Diagnosis not present

## 2024-02-06 DIAGNOSIS — F419 Anxiety disorder, unspecified: Secondary | ICD-10-CM | POA: Diagnosis not present

## 2024-02-06 DIAGNOSIS — E785 Hyperlipidemia, unspecified: Secondary | ICD-10-CM | POA: Diagnosis present

## 2024-02-06 DIAGNOSIS — J9611 Chronic respiratory failure with hypoxia: Secondary | ICD-10-CM | POA: Diagnosis not present

## 2024-02-06 DIAGNOSIS — F410 Panic disorder [episodic paroxysmal anxiety] without agoraphobia: Secondary | ICD-10-CM | POA: Diagnosis present

## 2024-02-06 DIAGNOSIS — M7989 Other specified soft tissue disorders: Secondary | ICD-10-CM | POA: Diagnosis not present

## 2024-02-06 DIAGNOSIS — E876 Hypokalemia: Secondary | ICD-10-CM | POA: Diagnosis present

## 2024-02-06 DIAGNOSIS — Z79891 Long term (current) use of opiate analgesic: Secondary | ICD-10-CM

## 2024-02-06 DIAGNOSIS — Z885 Allergy status to narcotic agent status: Secondary | ICD-10-CM

## 2024-02-06 DIAGNOSIS — B9620 Unspecified Escherichia coli [E. coli] as the cause of diseases classified elsewhere: Secondary | ICD-10-CM | POA: Diagnosis not present

## 2024-02-06 DIAGNOSIS — S32020S Wedge compression fracture of second lumbar vertebra, sequela: Secondary | ICD-10-CM | POA: Diagnosis not present

## 2024-02-06 DIAGNOSIS — K567 Ileus, unspecified: Secondary | ICD-10-CM | POA: Diagnosis present

## 2024-02-06 DIAGNOSIS — Z79899 Other long term (current) drug therapy: Secondary | ICD-10-CM

## 2024-02-06 DIAGNOSIS — G8929 Other chronic pain: Secondary | ICD-10-CM | POA: Diagnosis present

## 2024-02-06 DIAGNOSIS — R300 Dysuria: Secondary | ICD-10-CM | POA: Diagnosis not present

## 2024-02-06 DIAGNOSIS — R1084 Generalized abdominal pain: Secondary | ICD-10-CM | POA: Diagnosis not present

## 2024-02-06 DIAGNOSIS — G629 Polyneuropathy, unspecified: Secondary | ICD-10-CM | POA: Diagnosis present

## 2024-02-06 DIAGNOSIS — S129XXD Fracture of neck, unspecified, subsequent encounter: Secondary | ICD-10-CM | POA: Diagnosis not present

## 2024-02-06 DIAGNOSIS — Z91199 Patient's noncompliance with other medical treatment and regimen due to unspecified reason: Secondary | ICD-10-CM

## 2024-02-06 DIAGNOSIS — K5901 Slow transit constipation: Secondary | ICD-10-CM | POA: Diagnosis not present

## 2024-02-06 DIAGNOSIS — Z79890 Hormone replacement therapy (postmenopausal): Secondary | ICD-10-CM

## 2024-02-06 DIAGNOSIS — S12090S Other displaced fracture of first cervical vertebra, sequela: Secondary | ICD-10-CM | POA: Diagnosis not present

## 2024-02-06 LAB — CBC
HCT: 29.7 % — ABNORMAL LOW (ref 36.0–46.0)
Hemoglobin: 8.5 g/dL — ABNORMAL LOW (ref 12.0–15.0)
MCH: 28 pg (ref 26.0–34.0)
MCHC: 28.6 g/dL — ABNORMAL LOW (ref 30.0–36.0)
MCV: 97.7 fL (ref 80.0–100.0)
Platelets: 432 10*3/uL — ABNORMAL HIGH (ref 150–400)
RBC: 3.04 MIL/uL — ABNORMAL LOW (ref 3.87–5.11)
RDW: 16.3 % — ABNORMAL HIGH (ref 11.5–15.5)
WBC: 7.4 10*3/uL (ref 4.0–10.5)
nRBC: 0.7 % — ABNORMAL HIGH (ref 0.0–0.2)

## 2024-02-06 LAB — BLOOD GAS, VENOUS
Acid-Base Excess: 11.5 mmol/L — ABNORMAL HIGH (ref 0.0–2.0)
Bicarbonate: 39.6 mmol/L — ABNORMAL HIGH (ref 20.0–28.0)
Drawn by: 64724
O2 Saturation: 95 %
Patient temperature: 36.5
pCO2, Ven: 66 mmHg — ABNORMAL HIGH (ref 44–60)
pH, Ven: 7.39 (ref 7.25–7.43)
pO2, Ven: 70 mmHg — ABNORMAL HIGH (ref 32–45)

## 2024-02-06 LAB — CREATININE, SERUM
Creatinine, Ser: 0.96 mg/dL (ref 0.44–1.00)
GFR, Estimated: >60 mL/min (ref >60)

## 2024-02-06 LAB — BASIC METABOLIC PANEL WITH GFR
Anion gap: 8 (ref 5–15)
BUN: <5 mg/dL — ABNORMAL LOW (ref 8–23)
CO2: 33 mmol/L — ABNORMAL HIGH (ref 22–32)
Calcium: 8.3 mg/dL — ABNORMAL LOW (ref 8.9–10.3)
Chloride: 98 mmol/L (ref 98–111)
Creatinine, Ser: 0.88 mg/dL (ref 0.44–1.00)
GFR, Estimated: >60 mL/min (ref >60)
Glucose, Bld: 125 mg/dL — ABNORMAL HIGH (ref 70–99)
Potassium: 3.6 mmol/L (ref 3.5–5.1)
Sodium: 139 mmol/L (ref 135–145)

## 2024-02-06 LAB — GLUCOSE, CAPILLARY
Glucose-Capillary: 141 mg/dL — ABNORMAL HIGH (ref 70–99)
Glucose-Capillary: 129 mg/dL — ABNORMAL HIGH (ref 70–99)

## 2024-02-06 MED ORDER — BUSPIRONE HCL 15 MG PO TABS: 15.0000 mg | ORAL_TABLET | Freq: Three times a day (TID) | ORAL | Status: DC

## 2024-02-06 MED ORDER — ONDANSETRON HCL 4 MG/2ML IJ SOLN: 4.0000 mg | Freq: Four times a day (QID) | INTRAMUSCULAR | Status: DC | PRN

## 2024-02-06 MED ORDER — CYANOCOBALAMIN 1000 MCG/ML IJ SOLN
1000.0000 ug | Freq: Every day | INTRAMUSCULAR | Status: DC
  Administered 2024-02-08 – 2024-02-25 (×18): 1000 ug via SUBCUTANEOUS
  Filled 2024-02-06 (×19): qty 1

## 2024-02-06 MED ORDER — ACETAMINOPHEN 325 MG PO TABS
650.0000 mg | ORAL_TABLET | Freq: Four times a day (QID) | ORAL | Status: DC | PRN
  Administered 2024-02-06 – 2024-02-23 (×12): 650 mg via ORAL
  Filled 2024-02-06 (×18): qty 2

## 2024-02-06 MED ORDER — ALENDRONATE SODIUM 70 MG PO TABS: 70.0000 mg | ORAL_TABLET | ORAL | Status: DC

## 2024-02-06 MED ORDER — MORPHINE SULFATE ER 15 MG PO TBCR
15.0000 mg | EXTENDED_RELEASE_TABLET | Freq: Two times a day (BID) | ORAL | Status: DC
  Administered 2024-02-06 – 2024-02-07 (×2): 15 mg via ORAL
  Filled 2024-02-06 (×2): qty 1

## 2024-02-06 MED ORDER — HYDROMORPHONE HCL 1 MG/ML IJ SOLN: 1.0000 mg | INTRAMUSCULAR | Status: DC | PRN

## 2024-02-06 MED ORDER — HYDROCORTISONE 10 MG PO TABS
10.0000 mg | ORAL_TABLET | Freq: Every day | ORAL | Status: DC
  Administered 2024-02-07 – 2024-02-24 (×18): 10 mg via ORAL
  Filled 2024-02-06 (×19): qty 1

## 2024-02-06 MED ORDER — FOLIC ACID 1 MG PO TABS
1.0000 mg | ORAL_TABLET | Freq: Every day | ORAL | Status: DC
  Administered 2024-02-07 – 2024-02-25 (×19): 1 mg via ORAL
  Filled 2024-02-06 (×20): qty 1

## 2024-02-06 MED ORDER — CITALOPRAM HYDROBROMIDE 10 MG PO TABS
10.0000 mg | ORAL_TABLET | Freq: Every day | ORAL | Status: DC
  Administered 2024-02-07 – 2024-02-25 (×19): 10 mg via ORAL
  Filled 2024-02-06 (×20): qty 1

## 2024-02-06 MED ORDER — OXYCODONE HCL 5 MG PO TABS
30.0000 mg | ORAL_TABLET | ORAL | Status: DC | PRN
  Administered 2024-02-06: 30 mg via ORAL
  Filled 2024-02-06: qty 6

## 2024-02-06 MED ORDER — BUDESONIDE 0.5 MG/2ML IN SUSP
0.5000 mg | Freq: Two times a day (BID) | RESPIRATORY_TRACT | Status: DC
  Administered 2024-02-06 – 2024-02-24 (×34): 0.5 mg via RESPIRATORY_TRACT
  Filled 2024-02-06 (×37): qty 2

## 2024-02-06 MED ORDER — ROSUVASTATIN CALCIUM 20 MG PO TABS
20.0000 mg | ORAL_TABLET | Freq: Every day | ORAL | Status: DC
  Administered 2024-02-07 – 2024-02-25 (×19): 20 mg via ORAL
  Filled 2024-02-06 (×20): qty 1

## 2024-02-06 MED ORDER — ONDANSETRON HCL 4 MG PO TABS
4.0000 mg | ORAL_TABLET | Freq: Four times a day (QID) | ORAL | Status: DC | PRN
  Administered 2024-02-07 – 2024-02-24 (×17): 4 mg via ORAL
  Filled 2024-02-06 (×20): qty 1

## 2024-02-06 MED ORDER — TAMSULOSIN HCL 0.4 MG PO CAPS
0.4000 mg | ORAL_CAPSULE | Freq: Every day | ORAL | Status: DC
  Administered 2024-02-06 – 2024-02-17 (×12): 0.4 mg via ORAL
  Filled 2024-02-06 (×12): qty 1

## 2024-02-06 MED ORDER — VITAMIN D (ERGOCALCIFEROL) 1.25 MG (50000 UNIT) PO CAPS
50000.0000 [IU] | ORAL_CAPSULE | ORAL | Status: DC
  Administered 2024-02-09 – 2024-02-23 (×3): 50000 [IU] via ORAL
  Filled 2024-02-06 (×4): qty 1

## 2024-02-06 MED ORDER — ENOXAPARIN SODIUM 60 MG/0.6ML IJ SOSY
60.0000 mg | PREFILLED_SYRINGE | INTRAMUSCULAR | Status: DC
  Administered 2024-02-07 – 2024-02-24 (×18): 60 mg via SUBCUTANEOUS
  Filled 2024-02-06 (×18): qty 0.6

## 2024-02-06 MED ORDER — CYCLOBENZAPRINE HCL 5 MG PO TABS
5.0000 mg | ORAL_TABLET | Freq: Three times a day (TID) | ORAL | Status: DC
  Administered 2024-02-06 – 2024-02-25 (×57): 5 mg via ORAL
  Filled 2024-02-06 (×59): qty 1

## 2024-02-06 MED ORDER — OXYCODONE HCL 5 MG PO TABS: 30.0000 mg | ORAL_TABLET | Freq: Once | ORAL | Status: DC | PRN

## 2024-02-06 MED ORDER — HYDROCORTISONE 20 MG PO TABS
20.0000 mg | ORAL_TABLET | Freq: Every day | ORAL | Status: DC
  Administered 2024-02-07 – 2024-02-25 (×19): 20 mg via ORAL
  Filled 2024-02-06 (×20): qty 1

## 2024-02-06 MED ORDER — REVEFENACIN 175 MCG/3ML IN SOLN
175.0000 ug | Freq: Every day | RESPIRATORY_TRACT | Status: DC
  Administered 2024-02-07 – 2024-02-24 (×16): 175 ug via RESPIRATORY_TRACT
  Filled 2024-02-06 (×19): qty 3

## 2024-02-06 MED ORDER — SENNOSIDES-DOCUSATE SODIUM 8.6-50 MG PO TABS
2.0000 | ORAL_TABLET | Freq: Every day | ORAL | Status: DC
Start: 2024-02-06 — End: 2024-02-09
  Administered 2024-02-06 – 2024-02-08 (×3): 2 via ORAL
  Filled 2024-02-06 (×3): qty 2

## 2024-02-06 MED ORDER — LIDOCAINE 5 % EX PTCH
1.0000 | MEDICATED_PATCH | CUTANEOUS | Status: DC
  Administered 2024-02-07 – 2024-02-25 (×16): 1 via TRANSDERMAL
  Filled 2024-02-06 (×19): qty 1

## 2024-02-06 MED ORDER — HYDROXYZINE HCL 25 MG PO TABS
25.0000 mg | ORAL_TABLET | Freq: Three times a day (TID) | ORAL | Status: DC | PRN
  Administered 2024-02-06 – 2024-02-23 (×36): 25 mg via ORAL
  Filled 2024-02-06 (×39): qty 1

## 2024-02-06 MED ORDER — BUSPIRONE HCL 15 MG PO TABS
15.0000 mg | ORAL_TABLET | Freq: Three times a day (TID) | ORAL | Status: DC
  Administered 2024-02-06 – 2024-02-25 (×57): 15 mg via ORAL
  Filled 2024-02-06 (×59): qty 1

## 2024-02-06 MED ORDER — LACTULOSE 10 GM/15ML PO SOLN
10.0000 g | Freq: Every day | ORAL | Status: DC | PRN
  Administered 2024-02-14 – 2024-02-22 (×3): 10 g via ORAL
  Filled 2024-02-06 (×4): qty 15

## 2024-02-06 MED ORDER — LINACLOTIDE 72 MCG PO CAPS
72.0000 ug | ORAL_CAPSULE | Freq: Every day | ORAL | Status: DC
Start: 2024-02-07 — End: 2024-02-25
  Administered 2024-02-07 – 2024-02-25 (×18): 72 ug via ORAL
  Filled 2024-02-06 (×19): qty 1

## 2024-02-06 MED ORDER — ARFORMOTEROL TARTRATE 15 MCG/2ML IN NEBU
15.0000 ug | INHALATION_SOLUTION | Freq: Two times a day (BID) | RESPIRATORY_TRACT | Status: DC
  Administered 2024-02-06 – 2024-02-24 (×33): 15 ug via RESPIRATORY_TRACT
  Filled 2024-02-06 (×36): qty 2

## 2024-02-06 MED ORDER — LEVOTHYROXINE SODIUM 50 MCG PO TABS
50.0000 ug | ORAL_TABLET | Freq: Every day | ORAL | Status: DC
Start: 2024-02-07 — End: 2024-02-25
  Administered 2024-02-07 – 2024-02-25 (×19): 50 ug via ORAL
  Filled 2024-02-06 (×21): qty 1

## 2024-02-06 MED ORDER — ALBUTEROL SULFATE (2.5 MG/3ML) 0.083% IN NEBU
2.5000 mg | INHALATION_SOLUTION | RESPIRATORY_TRACT | Status: DC | PRN
  Administered 2024-02-08 – 2024-02-21 (×4): 2.5 mg via RESPIRATORY_TRACT
  Filled 2024-02-06 (×5): qty 3

## 2024-02-06 MED ORDER — DULOXETINE HCL 60 MG PO CPEP
60.0000 mg | ORAL_CAPSULE | Freq: Every day | ORAL | Status: DC
  Administered 2024-02-07 – 2024-02-25 (×19): 60 mg via ORAL
  Filled 2024-02-06 (×20): qty 1

## 2024-02-06 MED ORDER — MIDODRINE HCL 5 MG PO TABS
5.0000 mg | ORAL_TABLET | Freq: Two times a day (BID) | ORAL | Status: DC
  Administered 2024-02-06 – 2024-02-25 (×38): 5 mg via ORAL
  Filled 2024-02-06 (×39): qty 1

## 2024-02-06 MED ORDER — ACETAMINOPHEN 650 MG RE SUPP: 650.0000 mg | Freq: Four times a day (QID) | RECTAL | Status: DC | PRN

## 2024-02-06 MED ORDER — CYANOCOBALAMIN 1000 MCG/ML IJ SOLN: 1000.0000 ug | Freq: Every day | INTRAMUSCULAR | Status: DC

## 2024-02-06 MED ORDER — FUROSEMIDE 20 MG PO TABS
20.0000 mg | ORAL_TABLET | ORAL | Status: DC
  Administered 2024-02-07 – 2024-02-24 (×8): 20 mg via ORAL
  Filled 2024-02-06 (×8): qty 1

## 2024-02-06 MED ORDER — OXYCODONE HCL 30 MG PO TABS: 30.0000 mg | ORAL_TABLET | ORAL | Status: DC | PRN

## 2024-02-06 MED ORDER — FOLIC ACID 1 MG PO TABS: 1.0000 mg | ORAL_TABLET | Freq: Every day | ORAL | Status: DC

## 2024-02-06 MED ORDER — PANTOPRAZOLE SODIUM 40 MG PO TBEC
40.0000 mg | DELAYED_RELEASE_TABLET | Freq: Every day | ORAL | Status: DC
  Administered 2024-02-07 – 2024-02-08 (×2): 40 mg via ORAL
  Filled 2024-02-06 (×2): qty 1

## 2024-02-06 MED ORDER — OXYCODONE HCL 5 MG PO TABS
20.0000 mg | ORAL_TABLET | ORAL | Status: DC | PRN
  Administered 2024-02-06 – 2024-02-25 (×63): 20 mg via ORAL
  Filled 2024-02-06 (×70): qty 4

## 2024-02-06 MED ORDER — FERROUS SULFATE 325 (65 FE) MG PO TABS
325.0000 mg | ORAL_TABLET | ORAL | Status: DC
  Administered 2024-02-10 – 2024-02-24 (×3): 325 mg via ORAL
  Filled 2024-02-06 (×4): qty 1

## 2024-02-06 NOTE — H&P (Signed)
 Physical Medicine and Rehabilitation Admission H&P    Chief Complaint  Patient presents with   Back Pain  : HPI: Kathleen [name] is a 63 year old right handed female with history of chronic hypoxic respiratory failure (5 L O2) interstitial lung disease followed by pulmonary services Dr. Jolyne Needs at [facility], sarcoidosis maintained on chronic prednisone , Addison's disease maintained on Cortef  followed by endocrinology services Dr.Gebreselassie Nida, chronic hypotension, chronic anemia, chronic urinary incontinence, class III obesity BMI 39.75, bilateral hip AVN and not felt to be a surgical candidate, osteoporosis with multiple vertebral fractures on chronic narcotics followed at the [facility] pain clinic and recent admissions for intractable back pain due to compression fractures of L4 (4/12 - 4/16) and then L3 (5/9 - 5/14), readmitted 01/20/2024 with acute L1 fracture discharged to home 01/23/2024 with [facility].  Per chart review patient lives with spouse.  1 level home one-step to entry.  Patient readmitted 01/25/2024 with increasing low back pain to [facility].  CT lumbar spine showed superior endplate fracture of L1 appearing stable with approximately 10 to 15% loss of height no retropulsion.  Stable superior endplate fractures of L3 and L4 and also 25 to 30% loss of height and minimal retropulsion of the posterior superior aspect of the vertebral bodies without significant associated canal stenosis.  CT of the pelvis showed acute left pubic symphyseal and right sacral ala compression fractures/L4 superior endplate fracture as well as avascular necrosis of the left femoral head with mild articular collapse.  Admission chemistries unremarkable except chloride 94 CO2 30 glucose 145 alkaline phosphatase 129, hemoglobin 9.1, urine culture greater than 100,000 E. coli.  Patient was transferred to [facility] due to increasing back pain.  Interventional radiology consulted/Dr.  Alvira Josephs and discussed at length kyphoplasty which patient declined..  TLSO back brace as directed.  Hospital course patient did receive a 3-day course of intravenous ceftriaxone  for E. coli UTI 6/1 through 6/3.  She was cleared to begin Lovenox  for DVT prophylaxis.  Followed the distance by critical care medicine for pulmonary sarcoidosis/chronic hypoxic respiratory failure and currently on 4 L of oxygen  nasal cannula.  Therapy evaluations completed due to patient's decreased functional mobility and intractable back pain was admitted for a comprehensive rehab program.  Pt reports she gets anxious frequently- and only gets Vistaril - although she's on Duloxetine  and Buspar  15 mg TID as well. She reports she forgot about these meds.  But she feels her anxiety is really bad here in hospital- per the therapy notes, it's really impacting her ability to participate in therapy.  When offered Paxil- she initially said she had never taken it, but then said it made her sick to her stomach.   She willing to try Celexa  low dose with Duloxetine  to help prevent her anxiety- she was made aware that it's to prevent not for rescue anxiety help.  Her LBM was yesterday on BSC_ reports no issues with constipation.  Says cannot function without pain meds, but it doesn't make pain go away- we discussed how with the multiple fractures she has, pain will be an issue and to increase pain meds would increase her risk of respiratory depression.   She has foley, but it appears they are removing it today.    Review of Systems  Constitutional:  Negative for chills and fever.  HENT:  Negative for hearing loss.   Eyes:  Negative for blurred vision and double vision.  Respiratory:  Positive for shortness of  breath. Negative for wheezing.   Cardiovascular:  Positive for leg swelling. Negative for chest pain and palpitations.  Gastrointestinal:  Negative for constipation, heartburn, nausea and vomiting.       GERD   Genitourinary:  Negative for dysuria, flank pain and hematuria.       Chronic urinary incontinence  Musculoskeletal:  Positive for back pain, joint pain and myalgias.  Skin:  Negative for rash.  Neurological:  Positive for sensory change and weakness.  Psychiatric/Behavioral:  Positive for depression and memory loss. The patient is nervous/anxious.        Anxiety  All other systems reviewed and are negative.  Past Medical History:  Diagnosis Date   Adrenal insufficiency (HCC)    Anemia    Anxiety    Avascular bone necrosis (HCC)    Breast fibrocystic disorder    Chronic pain    Encounter for long-term opiate analgesic use 02/02/2020   Patient has chronic pain with femoral head avascular necrosis.  Is under pain contract   Fibromyalgia    Gastroesophageal reflux disease    Hypothyroidism    Mitral valve prolapse    Orthostatic hypotension    Peripheral neuropathy    PONV (postoperative nausea and vomiting)    Restrictive lung disease    Moderate to severe   Sarcoidosis    Biopsy proven - [facility]   Sarcoidosis    SOB (shortness of breath)    chronic   Past Surgical History:  Procedure Laterality Date   BIOPSY  10/17/2018   Procedure: BIOPSY;  Surgeon: Ruby Corporal, MD;  [facility];  Service: Endoscopy;;  duodenum, antrum, gastric body   BIOPSY  03/03/2021   Procedure: BIOPSY;  Surgeon: Ruby Corporal, MD;  [facility];  Service: Endoscopy;;   BIOPSY  09/11/2023   Procedure: BIOPSY;  Surgeon: Suzette Espy, MD;  [facility];  Service: Endoscopy;;   BREAST LUMPECTOMY Bilateral 01/12/2011   CHOLECYSTECTOMY N/A 04/17/2021   Procedure: LAPAROSCOPIC CHOLECYSTECTOMY;  Surgeon: Alanda Allegra, MD;  Location: [facility];  Service: General;  Laterality: N/A;   COLONOSCOPY WITH PROPOFOL  N/A 01/03/2022   Procedure: COLONOSCOPY WITH PROPOFOL ;  Surgeon: Ruby Corporal, MD;  [facility];  Service: Endoscopy;  Laterality: N/A;  220    ESOPHAGEAL DILATION N/A 03/03/2021   Procedure: ESOPHAGEAL DILATION;  Surgeon: Ruby Corporal, MD;  [facility];  Service: Endoscopy;  Laterality: N/A;   ESOPHAGOGASTRODUODENOSCOPY (EGD) WITH PROPOFOL  N/A 10/17/2018   Procedure: ESOPHAGOGASTRODUODENOSCOPY (EGD) WITH PROPOFOL ;  Surgeon: Ruby Corporal, MD;  [facility];  Service: Endoscopy;  Laterality: N/A;  2:25   ESOPHAGOGASTRODUODENOSCOPY (EGD) WITH PROPOFOL  N/A 03/03/2021   Procedure: ESOPHAGOGASTRODUODENOSCOPY (EGD) WITH PROPOFOL ;  Surgeon: Ruby Corporal, MD;  [facility];  Service: Endoscopy;  Laterality: N/A;  12:15   ESOPHAGOGASTRODUODENOSCOPY (EGD) WITH PROPOFOL  N/A 09/11/2023   Procedure: ESOPHAGOGASTRODUODENOSCOPY (EGD) WITH PROPOFOL ;  Surgeon: Suzette Espy, MD;  [facility];  Service: Endoscopy;  Laterality: N/A;  2:30 pm, asa 3, LM to see if pt can come earlier   [facility] DILATION N/A 09/11/2023   Procedure: Londa Rival DILATION;  Surgeon: Suzette Espy, MD;  [facility];  Service: Endoscopy;  Laterality: N/A;   ORIF ANKLE FRACTURE Left 03/10/2023   Procedure: OPEN REDUCTION INTERNAL FIXATION (ORIF) ANKLE FRACTURE;  Surgeon: Diedra Fowler, MD;  Location: [facility];  Service: Orthopedics;  Laterality: Left;   POLYPECTOMY  01/03/2022   Procedure: POLYPECTOMY INTESTINAL;  Surgeon: Ruby Corporal, MD;  [facility];  Service: Endoscopy;;   TUBAL LIGATION     Family History  Problem Relation Age of Onset   Cardiomyopathy Mother    Breast cancer Mother    Prostate cancer Father    Heart attack Maternal Grandmother    Heart attack Maternal Grandfather    Heart attack Paternal Grandmother    Bipolar disorder Daughter    Heart disease Maternal Aunt    Neurofibromatosis Cousin    Colon cancer Neg Hx    Social History:  reports that she has never smoked. She has never been exposed to tobacco smoke. She has never used smokeless tobacco. She reports that she does  not drink alcohol and does not use drugs. Allergies:  Allergies  Allergen Reactions   Bee Venom Anaphylaxis   Contrast Media [Iodinated Contrast Media] Other (See Comments)    CT chest w/ contrast at OSH followed by SOB resolved w/ single dose steroids, never ENT swelling, intubation or pressors, has had multiple contrasted scans since   Morphine  Other (See Comments)    Substance with morphinan structure and opioid receptor agonist mechanism of action (substance)   Bactrim  [Sulfamethoxazole -Trimethoprim ] Nausea And Vomiting   Codeine Itching   Medications Prior to Admission  Medication Sig Dispense Refill   acetaminophen  (TYLENOL ) 500 MG tablet Take 2 tablets (1,000 mg total) by mouth 3 (three) times daily. 30 tablet 0   albuterol  (PROVENTIL ) (2.5 MG/3ML) 0.083% nebulizer solution Inhale 3 mL (2.5 mg total) by nebulization every four (4) hours as needed for wheezing or shortness of breath. 75 mL 3   budesonide  (PULMICORT ) 0.5 MG/2ML nebulizer solution Inhale the contents of 1 vial via nebulizer two (2) times a day. 360 mL 3   busPIRone  (BUSPAR ) 15 MG tablet Take 1 tablet (15 mg) by mouth 3 times daily. 90 tablet 4   calcitonin, salmon, (MIACALCIN /FORTICAL) 200 UNIT/ACT nasal spray Use 1 spray in one nostril per day, alternate nostrils every other day (may have 1 vial she should use this for at least 2 weeks but may use as long as 4 weeks) 3.7 mL 0   cyclobenzaprine  (FLEXERIL ) 5 MG tablet Take 1 tablet (5 mg total) by mouth 3 (three) times daily. 90 tablet 2   DULoxetine  (CYMBALTA ) 60 MG capsule Take 1 capsule (60 mg total) by mouth daily. 30 capsule 5   EPINEPHrine  0.3 mg/0.3 mL IJ SOAJ injection Inject 0.3 mg into the muscle as needed for anaphylaxis. 1 each 2   ferrous sulfate  325 (65 FE) MG EC tablet Take 325 mg by mouth once a week.     furosemide  (LASIX ) 20 MG tablet Take 1 tablet (20 mg total) by mouth every Monday, Wednesday, and Friday. 40 tablet 3   hydrocortisone  (CORTEF ) 10 MG  tablet Take 2 tablets by mouth at 8:00 am and 1 tablet at lunch. (May double dose on sick days x 3-5 days) 120 tablet 3   hydrOXYzine  (ATARAX ) 25 MG tablet Take 1 tablet (25 mg total) by mouth 3 (three) times daily as needed for anxiety. 90 tablet 1   ipratropium-albuterol  (DUONEB) 0.5-2.5 (3) MG/3ML SOLN Take 3 mLs by nebulization every 8 (eight) hours. 270 mL 11   lactulose  (CHRONULAC ) 10 GM/15ML solution Take 15 mLs (10 g total) by mouth daily as needed for mild constipation. 237 mL 2   levothyroxine  (SYNTHROID ) 50 MCG tablet Take 1 tablet by mouth daily before breakfast. 90  tablet 1   lidocaine  (LIDODERM ) 5 % Place 1 patch onto the skin daily. Remove & Discard patch within 12 hours or as directed by MD 30 patch 0   LINZESS  72 MCG capsule Take 1 capsule (72 mcg total) by mouth daily before breakfast. 30 capsule 3   midodrine  (PROAMATINE ) 5 MG tablet Take 1 tablet (5 mg total) by mouth 2 (two) times daily with a meal. 60 tablet 5   ondansetron  (ZOFRAN -ODT) 8 MG disintegrating tablet Dissolve 1 tablet (8 mg total) by mouth every 8 (eight) hours as needed. (Patient taking differently: Take 8 mg by mouth every 8 (eight) hours as needed for nausea or vomiting.) 15 tablet 5   oxyCODONE  (ROXICODONE ) 15 MG immediate release tablet Take 1 tablet (15 mg total) by mouth every 4 (four) hours as needed for moderate pain (pain score 4-6) or severe pain (pain score 7-10). 45 tablet 0   OXYGEN  Inhale 5 L into the lungs continuous.     pantoprazole  (PROTONIX ) 40 MG tablet Take 1 tablet (40 mg total) by mouth daily. 30 tablet 5   polyethylene glycol (MIRALAX  / GLYCOLAX ) 17 g packet Take 17 g by mouth 2 (two) times daily. 60 each 4   predniSONE  (DELTASONE ) 5 MG tablet Take 1 tablet (5 mg total) by mouth in the morning. 30 tablet 5   rosuvastatin  (CRESTOR ) 20 MG tablet Take 1 tablet (20 mg total) by mouth daily. 90 tablet 1   senna-docusate (SENOKOT-S) 8.6-50 MG tablet Take 2 tablets by mouth at bedtime. 60 tablet 3    tamsulosin  (FLOMAX ) 0.4 MG CAPS capsule Take 1 capsule (0.4 mg total) by mouth daily after supper. 30 capsule 11   Vitamin D , Ergocalciferol , (DRISDOL ) 1.25 MG (50000 UNIT) CAPS capsule Take 1 capsule (50,000 Units total) by mouth every Sunday. 5 capsule 5      Home: Home Living Family/patient expects to be discharged to:: Private residence Living Arrangements: Spouse/significant other Available Help at Discharge: Family, Available PRN/intermittently Type of Home: House Home Access: Stairs to enter Secretary/administrator of Steps: 1 Entrance Stairs-Rails: None Home Layout: One level Bathroom Shower/Tub: Engineer, manufacturing systems: Handicapped height Bathroom Accessibility: Yes Home Equipment: Agricultural consultant (2 wheels), Art gallery manager, Information systems manager, Higher education careers adviser held shower head, BSC/3in1  Lives With: Spouse   Functional History: Prior Function Prior Level of Function : Needs assist Physical Assist : Mobility (physical), ADLs (physical) Mobility (physical): Bed mobility, Transfers, Gait, Stairs ADLs (physical): Dressing, Bathing, Toileting, IADLs Mobility Comments: short household distances using RW, scooter ADLs Comments: Pt reports she had not been home long endough from rehab to know what she could do, She had been getting herself to [facility]  Functional Status:  Mobility: Bed Mobility Overal bed mobility: Needs Assistance Bed Mobility: Rolling, Sidelying to Sit, Sit to Sidelying Rolling: Contact guard assist Sidelying to sit: Min assist, Used rails, HOB elevated Supine to sit: Min assist, Used rails Sit to supine: Contact guard assist Sit to sidelying: Min assist, HOB elevated, Used rails General bed mobility comments: pt received EOB lying on back, min assist for trunk to pull to sitting Transfers Overall transfer level: Needs assistance Equipment used: Rolling walker (2 wheels) Transfers: Sit to/from Stand, Bed to chair/wheelchair/BSC Sit to Stand: Mod assist, +2  physical assistance, From elevated surface Bed to/from chair/wheelchair/BSC transfer type:: Step pivot Step pivot transfers: Contact guard assist General transfer comment: +2 to come to stand, once in standing CGA to complete SPT Ambulation/Gait Ambulation/Gait assistance: Min assist, Mod assist Gait  Distance (Feet): 6 Feet Assistive device: Rolling walker (2 wheels) Gait Pattern/deviations: Decreased step length - left, Decreased stance time - right, Decreased stride length, Step-to pattern, Wide base of support General Gait Details: unable to progress Gait velocity: slow Pre-gait activities: marching at EOB    ADL: ADL Overall ADL's : Needs assistance/impaired Eating/Feeding: Set up, Sitting Grooming: Contact guard assist, Minimal assistance, Sitting Grooming Details (indicate cue type and reason): reliant on UE for sitting EOB on air mattress Upper Body Bathing: Minimal assistance, Sitting Lower Body Bathing: Maximal assistance, Total assistance, Sitting/lateral leans Upper Body Dressing : Sitting, Minimal assistance Lower Body Dressing: Maximal assistance, Total assistance, Sitting/lateral leans Toilet Transfer: Contact guard assist, Stand-pivot Toilet Transfer Details (indicate cue type and reason): inital +2 to come to stand, once in standing SPT with CGA Toileting- Clothing Manipulation and Hygiene: Total assistance, +2 for physical assistance, +2 for safety/equipment Toileting - Clothing Manipulation Details (indicate cue type and reason): +2 to come to stand and complete hygiene standing Functional mobility during ADLs: Contact guard assist General ADL Comments: Pt anxious regarding self-care tasks, and requires encouragement to complete tasks  Cognition: Cognition Orientation Level: Oriented X4 Cognition Arousal: Alert Behavior During Therapy: Anxious  Physical Exam: Blood pressure 100/71, pulse 86, temperature 97.7 F (36.5 C), temperature source Oral, resp. rate  17, height 5' 10 (1.778 m), weight 125.6 kg, last menstrual period 05/17/2016, SpO2 100%. Physical Exam Vitals and nursing note reviewed. Exam conducted with a chaperone present.  Constitutional:      Comments: Pt's husband at bedside; pt awake, but extremely anxious at rest with no stimuli; increased with any question; was anxious that couldn't see husband from her position rolled on L side- and husband in chair on right, No acute distress  HENT:     Head: Normocephalic and atraumatic.     Right Ear: External ear normal.     Left Ear: External ear normal.     Nose: Nose normal. No congestion.     Comments: 3-4L O2 by [place]- high flow O2    Mouth/Throat:     Mouth: Mucous membranes are dry.     Pharynx: No oropharyngeal exudate.   Eyes:     General:        Right eye: No discharge.        Left eye: No discharge.     Extraocular Movements: Extraocular movements intact.    Cardiovascular:     Rate and Rhythm: Normal rate and regular rhythm.     Heart sounds: Normal heart sounds. No murmur heard.    No gallop.     Comments: RRR- almost bradcyardic Pulmonary:     Comments: Appears SOB at rest- but hard to tell if is anxiety/or component of that compared to baseline SOB Has raspy cough intermittently Decreased breath sounds at bases- worse on LLL Coarse wheezing breath sounds throughout Abdominal:     Comments: Morbidly obese- most of weight in trunk- abd appears distended and firm- pt says it's at baseline- normoactive; NT   Musculoskeletal:     Cervical back: Tenderness present.     Comments: Trace LE edema to ankles- pt kept repeating that has a lot of fluid- but don't see much fluid Ue's5-/5 in Deltoids, biceps, triceps; WE 4/5- give way weakness; Grip 4+/5 and FA 4+/5 B/L LE's- 5-/5 throughout B/L   Skin:    General: Skin is warm and dry.     Comments: IV R forearm- looks OK   Neurological:  Comments: Patient is alert sitting up in bed.  Oriented x 3 and follows  commands. Very vague- kept asking husbands questions about meds- and asking if he knew anything about MS Contin , Celexa , etc- hard to gauge if anxiety, or decreased cognition Intact to light touch in all 4 extremities and face  Psychiatric:     Comments: Moderate anxiety at baseline and immediately revs up to high anxiety with any questions     No results found. However, due to the size of the patient record, not all encounters were searched. Please check Results Review for a complete set of results. No results found.    Blood pressure 100/71, pulse 86, temperature 97.7 F (36.5 C), temperature source Oral, resp. rate 17, height 5' 10 (1.778 m), weight 125.6 kg, last menstrual period 05/17/2016, SpO2 100%.  Medical Problem List and Plan: 1. Functional deficits secondary to compression and multiple fractures of spine without SCI-intractable acute on chronic low back pain due to osteoporotic fracture involving L1, L3 and L4/acute left pubic symphysis and right sacral ala fracture.  Patient declined kyphoplasty.  Conservative management with narcotics and TLSO  -patient may  shower  -ELOS/Goals: 12-14 days- supervision to min A  Admit to CIR 2.  Antithrombotics: -DVT/anticoagulation:  Pharmaceutical: Lovenox .  Check vascular study  -antiplatelet therapy: N/A 3. Pain Management/chronic pain followed at [facility] pain clinic- usually takes 10 mg 6x/day: Flexeril  5 mg 3 times daily, lidoderm  patch, oxycodone  30 mg every 4 hours as needed upon transfer- attempting, with d/w pt and husband, to add MS Contin  15 mg BID and reduce Oxycodone  to 20 mg q4 hours 4. Mood/Behavior/Sleep: Cymbalta  60 mg daily, BuSpar  15 mg 3 times daily, Atarax  25 mg 3 times daily as needed  -will add Celexa  10 mg daily for anxiety- is better for anxiety than Duloxetine , but need Duloxetine  for pain control  -antipsychotic agents: N/A 5. Neuropsych/cognition: This patient is ?capable of making decisions on her own  behalf. 6. Skin/Wound Care: Routine skin checks 7. Fluids/Electrolytes/Nutrition: Routine in and outs with follow-up chemistries 8.  Sarcoidosis/interstitial lung disease/chronic respiratory failure with hypoxia- Was on 4-5L at home- been doing 3-4L here- and concern for hypercarbia.  Continue chronic oxygen  therapy.  Prednisone  as directed, Lasix  20 mg Monday Wednesday Fridays.  Followed by Dr. Jolyne Needs pulmonary services [facility] 9.  Left femoral head avascular necrosis.Probably has bilaterally, based on chart review  Not a surgical candidate.  Conservative care 10.  E. coli UTI as well as chronic urinary incontinence.  Status post 3-day course of IV Rocephin  completed.  Continue Flomax  0.4 mg daily.  Check PVR 11.  Normocytic anemia.  Borderline MCV of 96 and RDW of 16.  B12 and folate within normal limits.  Check CBC 12.  Addison's disease.  Continue hydrocortisone  chronically.  Followed by endocrinology services Dr.Gebreselassie Nida 13.  Chronic hypotension.  ProAmatine  5 mg twice daily 14.  Class III morbid obesity.  BMI 39.75.  Dietary follow-up 15.  Hyperlipidemia.  Crestor  16.  Hypothyroidism.  Synthroid  17.  Chronic constipation.  Linzess  72 mcg daily, Senokot S2 tablets bedtime, Chronulac  as needed 18.  GERD.  Protonix  19. Noncompliance- Per nursing, pt refusing to use TLSO for [facility] transfers, of note- will d/w pt that need to follow recs, or there's more risk of additional fractures.   Everlyn Hockey Angiulli, PA-C 02/06/2024   I have personally performed a face to face diagnostic evaluation of this patient and formulated the key components of the plan.  Additionally, I have personally reviewed laboratory data, imaging studies, as well as relevant notes and concur with the physician assistant's documentation above.   The patient's status has not changed from the original H&P.  Any changes in documentation from the acute care chart have been noted above.

## 2024-02-06 NOTE — Progress Notes (Signed)
 Inpatient Rehab Admissions Coordinator:    I have a CIR bed for this Pt. Today. RN may call report to [phone].  Pt. To admit to CIR for an estimated 12-14 days with the goal of reaching Min A to CGA goals and returning home with assistance from her husband.   Wandalee Gust, MS, CCC-SLP Rehab Admissions Coordinator  [phone] (celll) [phone] (office)

## 2024-02-06 NOTE — Discharge Summary (Signed)
 Physician Discharge Summary  Patient ID: Kathleen [name] MRN: [id number] DOB/AGE: 10/07/1960 63 y.o.  Admit date: 02/06/2024 Discharge date: 02/25/2024  Discharge Diagnoses:  Principal Problem:   Spinal compression fracture [facility]) Active Problems:   Anxiety and depression   Intractable back pain   Anxiety DVT prophylaxis Chronic pain management Sarcoidosis/interstitial lung disease/chronic respiratory failure with hypoxia Left femoral head avascular necrosis E. coli UTI/chronic urinary incontinence Normocytic anemia Addison's disease Chronic hypotension Class III morbid obesity Hyperlipidemia Hypothyroidism Chronic constipation GERD Mood stabilization  Discharged Condition: Stable  Significant Diagnostic Studies: DG Chest 2 View Result Date: 02/19/2024 CLINICAL DATA:  [id number] Nausea [id number] EXAM: CHEST - 2 VIEW COMPARISON:  Jan 21, 2024, Jan 20, 2024, August 04, 2024 FINDINGS: Redemonstrated reticular opacities in both upper lung zones and in the left lung base, consistent with multifocal scarring on multiple prior CTs. No new lobar consolidation, pleural effusion, or pneumothorax. Mediastinal prominence due to underlying fat deposition. No cardiomegaly. No acute fracture or destructive lesions. Multilevel thoracic osteophytosis. IMPRESSION: No lobar pneumonia or findings of pulmonary edema. No pleural effusion. Electronically Signed   By: Rogelia Myers M.D.   On: 02/19/2024 17:30   DG Abd 2 Views Result Date: 02/19/2024 CLINICAL DATA:  Nausea.  Morbid obesity. EXAM: ABDOMEN - 2 VIEW COMPARISON:  02/17/2024 FINDINGS: Oral contrast material and stool is seen throughout the entire length of colon. No evidence of dilated bowel loops. Right upper quadrant surgical clips noted from prior cholecystectomy. Old fracture deformity of the left pubis incidentally noted. IMPRESSION: Unremarkable bowel gas pattern. Oral contrast material and stool throughout the colon. Electronically Signed   By:  Norleen DELENA Kil M.D.   On: 02/19/2024 17:19   DG Swallowing Func-Speech Pathology Result Date: 02/19/2024 Table formatting from the original result was not included. Modified Barium Swallow Study Patient Details Name: Kathleen [name] MRN: [id number] Date of Birth: 03-14-1961 Today's Date: 02/19/2024 HPI/PMH: HPI: Pt is a 63 year old right handed female with history of chronic hypoxic respiratory failure (5 L O2) interstitial lung disease followed by pulmonary services Dr. Selinda Moots at [facility], sarcoidosis maintained on chronic prednisone , Addison's disease maintained on Cortef  followed by endocrinology services Dr.Gebreselassie Nida, chronic hypotension, chronic anemia, chronic urinary incontinence, class III obesity BMI 39.75, bilateral hip AVN and not felt to be a surgical candidate, osteoporosis with multiple vertebral fractures on chronic narcotics followed at the [facility] pain clinic and recent admissions for intractable back pain due to compression fractures of L4 (4/12 - 4/16) and then L3 (5/9 - 5/14), readmitted 01/20/2024 with acute L1 fracture discharged to home 01/23/2024 with [facility] home health services. Per chart review patient lives with spouse. 1 level home one-step to entry. Patient readmitted 01/25/2024 with increasing low back pain to [facility]. CT lumbar spine showed superior endplate fracture of L1 appearing stable with approximately 10 to 15% loss of height no retropulsion. Stable superior endplate fractures of L3 and L4 and also 25 to 30% loss of height and minimal retropulsion of the posterior superior aspect of the vertebral bodies without significant associated canal stenosis. CT of the pelvis showed acute left pubic symphyseal and right sacral ala compression fractures/L4 superior endplate fracture as well as avascular necrosis of the left femoral head with mild articular collapse. Admission chemistries unremarkable except chloride 94 CO2 30 glucose 145 alkaline phosphatase 129, hemoglobin  9.1, urine culture greater than 100,000 E. coli. Patient was transferred to [facility] due to increasing back pain. Interventional radiology consulted/Dr. Dolphus and discussed  at length kyphoplasty which patient declined. TLSO back brace as directed. Hospital course patient did receive a 3-day course of intravenous ceftriaxone  for E. coli UTI 6/1 through 6/3. She was cleared to begin Lovenox  for DVT prophylaxis. Followed the distance by critical care medicine for pulmonary sarcoidosis/chronic hypoxic respiratory failure and currently on 4 L of oxygen  nasal cannula. Clinical Impression: Clinical Impression: Similar to MBSS completed 06/2023, pt presents w/ an overall functional oropharyngeal swallow. View was limited d/t pt positioning and body habitus, however, no airway invasion noted across trials. Of note, pt coughed immediately following pill trial (w/ thin liquids), though no penetration/aspiration was evident. Anticipate coughing was d/t hypersensation during upper esophageal clearance and/or anxiety. Pt was very anxious throughout the study and required frequent encouragement to continue. Unable to complete esophageal sweep d/t body habitus, however, pt would benefit from further esophageal assessment to r/o any complications that could be negatively impacting her overall swallow function. Skilled ST/oropharyngeal dysphagia intervention not warranted at this time. Factors that may increase risk of adverse event in presence of aspiration [name] & [name] 2021): Factors that may increase risk of adverse event in presence of aspiration [name] & [name] 2021): Frail or deconditioned Recommendations/Plan: Swallowing Evaluation Recommendations Swallowing Evaluation Recommendations Recommendations: PO diet PO Diet Recommendation: Regular; Thin liquids (Level 0) Liquid Administration via: Straw Medication Administration: Whole meds with liquid Supervision: Patient able to self-feed Swallowing strategies   : Slow rate; Small bites/sips Postural changes: Stay upright 30-60 min after meals; Position pt fully upright for meals Oral care recommendations: Oral care BID (2x/day) Recommended consults: Consider GI consultation; Consider esophageal assessment Treatment Plan Treatment Plan Treatment recommendations: No treatment recommended at this time Follow-up recommendations: No SLP follow up Functional status assessment: Patient has not had a recent decline in their functional status. Recommendations Recommendations for follow up therapy are one component of a multi-disciplinary discharge planning process, led by the attending physician.  Recommendations may be updated based on patient status, additional functional criteria and insurance authorization. Assessment: Orofacial Exam: Orofacial Exam Oral Cavity: Oral Hygiene: WFL Oral Cavity - Dentition: Missing dentition; Adequate natural dentition Orofacial Anatomy: WFL Oral Motor/Sensory Function: WFL Anatomy: Anatomy: WFL Boluses Administered: Boluses Administered Boluses Administered: Thin liquids (Level 0); Mildly thick liquids (Level 2, nectar thick); Puree; Solid  Oral Impairment Domain: Oral Impairment Domain Lip Closure: No labial escape Tongue control during bolus hold: Cohesive bolus between tongue to palatal seal Bolus preparation/mastication: Timely and efficient chewing and mashing Bolus transport/lingual motion: Brisk tongue motion Oral residue: Complete oral clearance Location of oral residue : N/A Initiation of pharyngeal swallow : Posterior laryngeal surface of the epiglottis  Pharyngeal Impairment Domain: Pharyngeal Impairment Domain Soft palate elevation: No bolus between soft palate (SP)/pharyngeal wall (PW) Laryngeal elevation: Complete superior movement of thyroid  cartilage with complete approximation of arytenoids to epiglottic petiole Anterior hyoid excursion: Complete anterior movement Epiglottic movement: Complete inversion Laryngeal vestibule  closure: Complete, no air/contrast in laryngeal vestibule Pharyngeal stripping wave : Present - complete Pharyngeal contraction (A/P view only): N/A Pharyngoesophageal segment opening: Complete distension and complete duration, no obstruction of flow Tongue base retraction: No contrast between tongue base and posterior pharyngeal wall (PPW) Pharyngeal residue: Collection of residue within or on pharyngeal structures Location of pharyngeal residue: Tongue base; Valleculae; Pyriform sinuses  Esophageal Impairment Domain: No data recorded Pill: Pill Consistency administered: Thin liquids (Level 0) Thin liquids (Level 0): [facility] Penetration/Aspiration Scale Score: Penetration/Aspiration Scale Score 1.  Material does not enter airway: Thin liquids (Level 0);  Mildly thick liquids (Level 2, nectar thick); Puree; Solid; Pill Compensatory Strategies: Compensatory Strategies Compensatory strategies: No   General Information: Caregiver present: No  Diet Prior to this Study: Thin liquids (Level 0); Other (Comment) (soft)   Temperature : Normal   Respiratory Status: WFL   Supplemental O2: Nasal cannula (4L O2 via nasal cannula)   No data recorded Behavior/Cognition: Alert; Requires cueing Self-Feeding Abilities: Able to self-feed Baseline vocal quality/speech: Dysphonic Volitional Cough: Able to elicit Volitional Swallow: Able to elicit Exam Limitations: Limited visibility Goal Planning: No data recorded No data recorded No data recorded Patient/Family Stated Goal: to eat without choking Consulted and agree with results and recommendations: Patient Pain: Pain Assessment Faces Pain Scale: 8 Pain Location: back Pain Descriptors / Indicators: Grimacing; Crying Pain Intervention(s): Repositioned; Relaxation End of Session: Start Time:No data recorded Stop Time: No data recorded Time Calculation:No data recorded Charges: No data recorded SLP visit diagnosis: SLP Visit Diagnosis: Other (comment) (anticipate esophageal dysphagia) Past  Medical History: Past Medical History: Diagnosis Date  Adrenal insufficiency (HCC)   Anemia   Anxiety   Avascular bone necrosis (HCC)   Breast fibrocystic disorder   Chronic pain   Encounter for long-term opiate analgesic use 02/02/2020  Patient has chronic pain with femoral head avascular necrosis.  Is under pain contract  Fibromyalgia   Gastroesophageal reflux disease   Hypothyroidism   Mitral valve prolapse   Orthostatic hypotension   Peripheral neuropathy   PONV (postoperative nausea and vomiting)   Restrictive lung disease   Moderate to severe  Sarcoidosis   Biopsy proven - [facility]  Sarcoidosis   SOB (shortness of breath)   chronic Past Surgical History: Past Surgical History: Procedure Laterality Date  BIOPSY  10/17/2018  Procedure: BIOPSY;  Surgeon: Golda Claudis PENNER, MD;  [facility];  Service: Endoscopy;;  duodenum, antrum, gastric body  BIOPSY  03/03/2021  Procedure: BIOPSY;  Surgeon: Golda Claudis PENNER, MD;  [facility];  Service: Endoscopy;;  BIOPSY  09/11/2023  Procedure: BIOPSY;  Surgeon: Shaaron Lamar HERO, MD;  [facility];  Service: Endoscopy;;  BREAST LUMPECTOMY Bilateral 01/12/2011  CHOLECYSTECTOMY N/A 04/17/2021  Procedure: LAPAROSCOPIC CHOLECYSTECTOMY;  Surgeon: Mavis Anes, MD;  Location: [facility];  Service: General;  Laterality: N/A;  COLONOSCOPY WITH PROPOFOL  N/A 01/03/2022  Procedure: COLONOSCOPY WITH PROPOFOL ;  Surgeon: Golda Claudis PENNER, MD;  [facility];  Service: Endoscopy;  Laterality: N/A;  220  ESOPHAGEAL DILATION N/A 03/03/2021  Procedure: ESOPHAGEAL DILATION;  Surgeon: Golda Claudis PENNER, MD;  [facility];  Service: Endoscopy;  Laterality: N/A;  ESOPHAGOGASTRODUODENOSCOPY (EGD) WITH PROPOFOL  N/A 10/17/2018  Procedure: ESOPHAGOGASTRODUODENOSCOPY (EGD) WITH PROPOFOL ;  Surgeon: Golda Claudis PENNER, MD;  [facility];  Service: Endoscopy;  Laterality: N/A;  2:25  ESOPHAGOGASTRODUODENOSCOPY (EGD) WITH PROPOFOL  N/A 03/03/2021   Procedure: ESOPHAGOGASTRODUODENOSCOPY (EGD) WITH PROPOFOL ;  Surgeon: Golda Claudis PENNER, MD;  [facility];  Service: Endoscopy;  Laterality: N/A;  12:15  ESOPHAGOGASTRODUODENOSCOPY (EGD) WITH PROPOFOL  N/A 09/11/2023  Procedure: ESOPHAGOGASTRODUODENOSCOPY (EGD) WITH PROPOFOL ;  Surgeon: Shaaron Lamar HERO, MD;  [facility];  Service: Endoscopy;  Laterality: N/A;  2:30 pm, asa 3, LM to see if pt can come earlier  [facility] DILATION N/A 09/11/2023  Procedure: AGAPITO DILATION;  Surgeon: Shaaron Lamar HERO, MD;  [facility];  Service: Endoscopy;  Laterality: N/A;  ORIF ANKLE FRACTURE Left 03/10/2023  Procedure: OPEN REDUCTION INTERNAL FIXATION (ORIF) ANKLE FRACTURE;  Surgeon: Georgina Ozell LABOR, MD;  Location: [facility];  Service: Orthopedics;  Laterality: Left;  POLYPECTOMY  01/03/2022  Procedure: POLYPECTOMY INTESTINAL;  Surgeon: Golda Claudis PENNER, MD;  [facility];  Service: Endoscopy;;  TUBAL LIGATION   [name] DELENA Mole 02/19/2024, 11:15 AM  DG Abd 1 View Result Date: 02/17/2024 CLINICAL DATA:  Ileus. EXAM: ABDOMEN - 1 VIEW COMPARISON:  Radiograph dated 02/15/2024. FINDINGS: Evaluation is limited due to body habitus and soft tissue attenuation. No bowel dilatation or evidence of obstruction. There is mild colonic stool burden. Right upper quadrant cholecystectomy clips. Degenerative changes of the spine. No acute osseous pathology. IMPRESSION: Nonobstructive bowel gas pattern. Electronically Signed   By: Vanetta Chou M.D.   On: 02/17/2024 11:36   DG Abd 2 Views Result Date: 02/15/2024 CLINICAL DATA:  Nausea EXAM: ABDOMEN - 2 VIEW COMPARISON:  None Available. FINDINGS: Mildly dilated gas-filled bowel loops within the left hemiabdomen. No free air or pneumatosis. Large volume stool projects over the ascending colon. Small to moderate volume stool within the remainder of the colon. No abnormal radio-opaque calculi or mass effect. No acute or substantial osseous abnormality. The sacrum  and coccyx are partially obscured by overlying bowel contents. IMPRESSION: 1. Mildly dilated gas-filled bowel loops within the left hemiabdomen, likely ileus. 2. Large volume stool projects over the ascending colon. Small to moderate volume stool within the remainder of the colon. Electronically Signed   By: Limin  Xu M.D.   On: 02/15/2024 11:01   DG Abd 1 View Result Date: 02/14/2024 CLINICAL DATA:  Constipation EXAM: ABDOMEN - 1 VIEW COMPARISON:  02/09/2024 FINDINGS: Limited by motion and body habitus as well as rectangular artifact obscuring the left mid abdomen. Suspicion of some dilated central small bowel measuring to 4.7 cm. Some gas in the colon is observed IMPRESSION: Limited by motion and body habitus. Suspicion of some dilated central small bowel, ileus versus partial small bowel obstruction. Electronically Signed   By: Luke Bun M.D.   On: 02/14/2024 19:52   DG Lumbar Spine 2-3 Views Result Date: 02/10/2024 CLINICAL DATA:  Back pain. EXAM: LUMBAR SPINE - 3 VIEW COMPARISON:  01/25/2024. FINDINGS: L1, L3 and L4 compression deformities. L1 compression fracture has worsened compared to the prior study. No spondylolisthesis. No focal osteolytic or osteoblastic changes. IMPRESSION: Multiple compression deformities with worsening at the L1 level. Electronically Signed   By: Fonda Field M.D.   On: 02/10/2024 19:45   DG Abd 1 View Result Date: 02/09/2024 CLINICAL DATA:  [id number] Ileus [facility]) [id number] EXAM: ABDOMEN - 1 VIEW COMPARISON:  Jan 25, 2024 FINDINGS: Evaluation is limited by under penetration. Air and stool filled loops of bowel. No discretely dilated loops of bowel are visualized. Air is visualized in the rectum. Moderate colonic stool burden. Patchy bibasilar opacities. Degenerative changes of the thoracolumbar spine with a similar compression fracture deformity of the lower thoracic spine. Status post cholecystectomy. IMPRESSION: 1. No discretely dilated loops of bowel are visualized within  the limitations of the exam. 2. Patchy bibasilar opacities, possibly atelectasis. Electronically Signed   By: Corean Salter M.D.   On: 02/09/2024 09:06   VAS US  LOWER EXTREMITY VENOUS (DVT) Result Date: 02/08/2024  Lower Venous DVT Study Patient Name:  WILEY FLICKER  Date of Exam:   02/07/2024 Medical Rec #: [id number]       Accession #:    [id number] Date of Birth: 1961/08/10       Patient Gender: F Patient Age:   10 years Exam Location:  [facility] Procedure:      VAS US  LOWER EXTREMITY VENOUS (DVT) Referring Phys: Compton Brigance --------------------------------------------------------------------------------  Indications: Pain, and Swelling. Other Indications: Rehab patient. Limitations: Body habitus, poor ultrasound/tissue interface and pain intolerance. Comparison Study: Previous exam on 02/26/2023 was negative for DVT. Performing Technologist: Ezzie Potters RVT, RDMS  Examination Guidelines: A complete evaluation includes B-mode imaging, spectral Doppler, color Doppler, and power Doppler as needed of all accessible portions of each vessel. Bilateral testing is considered an integral part of a complete examination. Limited examinations for reoccurring indications may be performed as noted. The reflux portion of the exam is performed with the patient in reverse Trendelenburg.  +---------+---------------+---------+-----------+----------+-------------------+ RIGHT    CompressibilityPhasicitySpontaneityPropertiesThrombus Aging      +---------+---------------+---------+-----------+----------+-------------------+ CFV      Full           Yes      Yes                                      +---------+---------------+---------+-----------+----------+-------------------+ SFJ      Full                                                             +---------+---------------+---------+-----------+----------+-------------------+ FV Prox  Full           Yes      Yes                                       +---------+---------------+---------+-----------+----------+-------------------+ FV Mid   Full           Yes      Yes                                      +---------+---------------+---------+-----------+----------+-------------------+ FV DistalFull           Yes      Yes                                      +---------+---------------+---------+-----------+----------+-------------------+ PFV                     Yes      Yes                                      +---------+---------------+---------+-----------+----------+-------------------+ POP      Full           Yes      Yes                                      +---------+---------------+---------+-----------+----------+-------------------+ PTV      Full                                                             +---------+---------------+---------+-----------+----------+-------------------+  PERO                                                  Not well visualized +---------+---------------+---------+-----------+----------+-------------------+   +---------+---------------+---------+-----------+----------+-------------------+ LEFT     CompressibilityPhasicitySpontaneityPropertiesThrombus Aging      +---------+---------------+---------+-----------+----------+-------------------+ CFV      Full           Yes      Yes                                      +---------+---------------+---------+-----------+----------+-------------------+ SFJ      Full                                                             +---------+---------------+---------+-----------+----------+-------------------+ FV Prox  Full           Yes      Yes                                      +---------+---------------+---------+-----------+----------+-------------------+ FV Mid   Full           Yes      Yes                                       +---------+---------------+---------+-----------+----------+-------------------+ FV DistalFull           Yes      Yes                                      +---------+---------------+---------+-----------+----------+-------------------+ PFV      Full                                                             +---------+---------------+---------+-----------+----------+-------------------+ POP      Full           Yes      Yes                                      +---------+---------------+---------+-----------+----------+-------------------+ PTV                                                   Not well visualized +---------+---------------+---------+-----------+----------+-------------------+ PERO     Full                                                             +---------+---------------+---------+-----------+----------+-------------------+  Summary: BILATERAL: - No evidence of deep vein thrombosis seen in the lower extremities, bilaterally. -No evidence of popliteal cyst, bilaterally.   *See table(s) above for measurements and observations. Electronically signed by Gaile New MD on 02/08/2024 at 10:56:51 AM.    Final    CT PELVIS WO CONTRAST Result Date: 01/25/2024 CLINICAL DATA:  Pelvic pain, chronic steroid use, lumbar compression fractures EXAM: CT PELVIS WITHOUT CONTRAST TECHNIQUE: Multidetector CT imaging of the pelvis was performed following the standard protocol without intravenous contrast. RADIATION DOSE REDUCTION: This exam was performed according to the departmental dose-optimization program which includes automated exposure control, adjustment of the mA and/or kV according to patient size and/or use of iterative reconstruction technique. COMPARISON:  None Available. FINDINGS: Urinary Tract:  No abnormality visualized. Bowel:  Unremarkable visualized pelvic bowel loops. Vascular/Lymphatic: No pathologically enlarged lymph nodes. No significant vascular  abnormality seen. Reproductive: The pelvic organs are unremarkable. No free intraperitoneal fluid within the pelvis. Other:  No abdominal wall hernia Musculoskeletal: Acute left pubic symphyseal and right sacral alar compression fractures are identified in keeping with a lateral compression type 1 injury. The sacroiliac joint spaces are preserved. Pubic symphyseal joint space is preserved. No other fracture identified. No dislocation. Avascular necrosis of the left femoral head noted with mild articular collapse. Joint space appears preserved, however, mild degenerative arthritis is present with tiny osteophyte formation noted. L4 superior endplate fracture noted. IMPRESSION: 1. Acute left pubic symphyseal and right sacral alar compression fractures in keeping with a lateral compression type 1 injury. 2. L4 superior endplate fracture. 3. Avascular necrosis of the left femoral head with mild articular collapse. Electronically Signed   By: Dorethia Molt M.D.   On: 01/25/2024 23:25   CT Lumbar Spine Wo Contrast Result Date: 01/25/2024 CLINICAL DATA:  Low back pain, constant or radicular pain, no prior imaging (Ped 0-17y). Chronic steroid use, compression fracture. EXAM: CT LUMBAR SPINE WITHOUT CONTRAST TECHNIQUE: Multidetector CT imaging of the lumbar spine was performed without intravenous contrast administration. Multiplanar CT image reconstructions were also generated. RADIATION DOSE REDUCTION: This exam was performed according to the departmental dose-optimization program which includes automated exposure control, adjustment of the mA and/or kV according to patient size and/or use of iterative reconstruction technique. COMPARISON:  01/21/2024 FINDINGS: Segmentation: 5 lumbar type vertebrae. Alignment: Normal. Vertebrae: Superior endplate fracture of L1 is again seen and appears stable with proximally 10-15% loss of height and no retropulsion. This is new since MRI examination of 01/03/2024 and is acute to  subacute in nature. Stable superior endplate fractures of L3 and L4 are also no 25-30% loss of height and minimal (2-3 mm) retropulsion of the posterosuperior aspect of the vertebral bodies without significant associated canal stenosis. No interval fracture since prior examination. Remaining vertebral body height is preserved. The osseous structures are diffusely osteopenic. Paraspinal and other soft tissues: Negative. Disc levels: Intervertebral disc heights are preserved. Vacuum disc phenomena noted at L2-3. No significant canal stenosis. No significant neuroforaminal narrowing. IMPRESSION: 1. Stable superior endplate fractures of L1, L3 and L4 as described above. 2. No interval fracture since prior examination. 3. No significant canal stenosis or neuroforaminal narrowing. Electronically Signed   By: Dorethia Molt M.D.   On: 01/25/2024 23:08   DG Lumbar Spine Complete Result Date: 01/25/2024 CLINICAL DATA:  Compression fracture, chronic steroid use, low back pain EXAM: LUMBAR SPINE - COMPLETE 4+ VIEW COMPARISON:  04/07/2020 FINDINGS: Interval development of superior endplate fractures of L1 with minimal loss of height. Stable superior  endplate fractures of L3 and L4, acute to subacute and better assessed on prior MRI examination of 01/03/2024. No listhesis. Intervertebral disc heights are preserved. Paraspinal soft tissues are unremarkable. IMPRESSION: 1. Interval development of acute superior endplate fractures of L1 with minimal loss of height since prior MRI examination. 2. Stable superior endplate fractures L3 and L4, better assessed on MRI examination of 01/03/2024 four views are seen to be acute-subacute. Electronically Signed   By: Dorethia Molt M.D.   On: 01/25/2024 21:04    Labs:  Basic Metabolic Panel: Recent Labs  Lab 02/20/24 0509 02/21/24 0542  NA 139 138  K 3.2* 3.6  CL 100 98  CO2 36* 32  GLUCOSE 113* 90  BUN 6* 5*  CREATININE 0.86 0.81  CALCIUM  8.2* 8.4*    CBC: Recent Labs   Lab 02/20/24 0509  WBC 8.4  HGB 8.3*  HCT 27.8*  MCV 94.9  PLT 351    CBG: No results for input(s): GLUCAP in the last 168 hours.  Family history.  Mother with cardiomyopathy and breast cancer father with prostate cancer.  Denies any colon cancer or esophageal cancer or rectal cancer  Brief HPI:   Jayleah Garbers Gonzalo is a 63 y.o. right-handed female with history significant for chronic hypoxic respiratory failure interstitial lung disease followed by pulmonary service Dr. Selinda Moots at [facility], sarcoidosis maintained on chronic prednisone  Addison's disease maintained on Cortef  followed by endocrinology services [name], chronic hypotension, chronic anemia chronic urinary incontinence class III morbid obesity with BMI 39.75 bilateral hip AVN and not felt to be a surgical candidate osteoporosis with multiple vertebral fractures on chronic narcotics followed at [facility] pain clinic and recent admissions for intractable back pain due to compression fractures of L4 (4/12 - 4/16) and then L3 (5/9 - 5/14, readmitted 01/20/2024 with acute L1 fracture discharged to home 01/23/2024 with home health services.  Per chart review lives with spouse 1 level home.  Patient presented 01/25/2024 with increasing low back pain to [facility].  CT lumbar spine showed superior endplate fracture of L1 appearing stable with approximately 10 to 15% loss of height no retropulsion.  Stable superior endplate fractures of L3 and L4 and also 25 to 30% loss of height and minimal retropulsion of the posterior superior aspect of the vertebral bodies without significant associated canal stenosis.  CT of the pelvis showed acute left pubic symphyseal and right sacral ala compression fractures/L4 superior endplate fracture as well as avascular necrosis of the left femoral head with mild articular collapse.  Admission chemistries unremarkable except chloride 94 CO2 30 glucose 145 alkaline phosphatase 129 hemoglobin 9.1 urine culture greater  than 100,000 E. coli.  Patient was transferred to [facility] due to increasing back pain.  Interventional radiology consulted and discussed at length kyphoplasty which patient declined.  TLSO back brace as directed with conservative care.  She needs ongoing encouragement to maintain brace.  Hospital course patient did have a 3-day course of intravenous ceftriaxone  for E. coli UTI through 6/3.  She was cleared to begin Lovenox  for DVT prophylaxis.  Follow at a distance by critical care medicine pulmonary services for sarcoidosis chronic hypoxic respiratory failure and maintain on oxygen  therapy.  Therapy evaluations completed due to patient decreased functional mobility was admitted for a comprehensive rehab program.   Hospital Course: Nashae Maudlin Schlottman was admitted to rehab 02/06/2024 for inpatient therapies to consist of PT, ST and OT at least three hours five days a week. Past admission  physiatrist, therapy team and rehab RN have worked together to provide customized collaborative inpatient rehab.  Pertaining to patient's compression and multiple fractures of spine without SCI intractable acute on chronic low back pain due to osteoporotic fracture involving L1, L3 and L4/acute left pubic symphysis and right sacral ala fracture.  Patient declined kyphoplasty.  Conservative care narcotics and TLSO back brace.  Need encouragement to maintain TLSO back brace.  Lovenox  for DVT prophylaxis venous Doppler studies negative.  Chronic pain management followed at the [facility] pain clinic maintained on Flexeril  as well as oxycodone  with the addition of OxyContin  20 mg every 12 hours and oxycodone  was decreased from 30 to 20 mg every 4 hours changed to MS Contin  30 mg every 12 hours due to insurance.  Sarcoidosis interstitial lung disease chronic respiratory failure monitoring of oxygen  saturations every shift followed by Dr. Selinda Moots of pulmonary services [facility].  Chronic prednisone  therapy as directed Lasix  Monday  Wednesday Friday.  Left femoral head avascular necrosis not a surgical candidate conservative care.  She did complete a course of cefepime  for E. coli UTI noted bouts of chronic urinary incontinence continued on Flomax  monitoring of PVRs.  Normocytic anemia borderline MCV of 96 and RDW 16 B12 and folate within normal limits monitoring of CBCs.  Addison's disease continue hydrocortisone  chronically followed by endocrinology services [name].  Chronic hypotension with ProAmatine  5 mg twice daily monitored with increased mobility.  Class III morbid obesity BMI 39.75 dietary follow-up.  Crestor  ongoing for hyperlipidemia.  Synthroid  with hormone replacement as directed.  Chronic constipation with Linzess  as well as Senokot Chronulac  as needed.  Protonix  ongoing for GERD.  Mood stabilization with the use of BuSpar  as well as Celexa  and emotional support provided.   Blood pressures were monitored on TID basis and remained soft and monitored      Rehab course: During patient's stay in rehab weekly team conferences were held to monitor patient's progress, set goals and discuss barriers to discharge. At admission, patient required min mod assist 6 feet rolling walker moderate assist sit to stand  He/She  has had improvement in activity tolerance, balance, postural control as well as ability to compensate for deficits. He/She has had improvement in functional use RUE/LUE  and RLE/LLE as well as improvement in awareness.  Patient ambulates with a rolling walker 20 feet.  Working with energy conservation techniques.  Back brace is indicated.  She was a bit impulsive and needed some cues.  Transfers edge of bed contact-guard.  Transfer sit to stand from elevated bed height with rolling walker.  During ADLs patient steadily increasing progress with goals throughout IPR stay.  Patient able to utilize AE for increased independence with ADLs such as bathing and dressing.  Standby assist overall bed mobility.  Assist for  upper body dressing due to brace.  Lower body dressing able to complete multiple trials of task with contact-guard overall.  Footwear minimal assist in order to don slip on shoes.  Full family teaching completed plan discharge to home       Disposition:  Discharge disposition: 06-Home-Health Care Svc        Diet: Regular  Special Instructions: No driving smoking or alcohol  TLSO back brace when out of bed  Continue chronic oxygen  therapy as directed  Medications at discharge. 1.  Tylenol  as needed 2.  BuSpar  15 mg 3 times daily 3.  Celexa  10 mg daily 4.  Vitamin B12 injection 1000 mcg daily 5.  Flexeril  5  mg p.o. 3 times daily 6.  Cymbalta  60 mg p.o. daily 7.  Ferrous sulfate  325 mg weekly 8.  Folic acid  1 mg daily 9.  Lasix  20 mg every Monday Wednesday Friday 10.  Cortef  10 mg daily with lunch and 20 mg daily at 08 100 11.  Atarax  25 mg p.o. 3 times daily as needed anxiety 12.  Synthroid  50 mcg p.o. daily 13.  Lidoderm  patch changes directed 14.  Linzess  72 mcg p.o. daily before breakfast 15.  ProAmatine  5 mg p.o. twice daily with meals 16.  MS Contin  30 mg every 12 hours 17.  Oxycodone  20 mg every 4 hours as needed severe pain 18.  Protonix  40 mg p.o. twice daily 19.  Yupelri  nebulizer solution 175 mcg daily 20.  Crestor  20 mg p.o. daily 21.  Flomax  0.8 mg daily after supper 22.  Vitamin D  50,000 units every Sunday 23.  Fosamax  70 mg every 7 days 24.  DuoNeb nebulizer 3 mL every 8 hours 25.  Proventil  nebulizer solution 0.083% every 4 hours as needed wheezing 26.  Pulmicort  nebulizer solution 0.5 mg / 2 mL twice daily 27.  Reglan  5 mg 3 times daily 28.  MiraLAX  twice daily hold for loose stools 29.  Senokot S2 tablets at bedtime  30-35 minutes were spent completing discharge summary and discharge planning      Follow-up Information     Lovorn, Megan, MD Follow up.   Specialty: Physical Medicine and Rehabilitation Why: No formal follow-up  needed Contact information: [address] [place] [address] [phone]         Diana Rodgers PARAS, MD Follow up.   Specialty: Internal Medicine Why: Call for appointment Contact information: [address] [place] [address] [phone]         Lenis Ethelle ORN, MD Follow up.   Specialty: Endocrinology Why: Call for appointment Contact information: [address] [place] [address] [phone]                 Signed: Toribio PARAS Navjot Loera 02/24/2024, 5:22 AM

## 2024-02-06 NOTE — Progress Notes (Addendum)
 Given BSC to aide her with voiding due to catheter being removed today. Reporting pressure and urge to void. Given PRN pain medication upon arrival to the unit this afternoon. Arrived to unit at 1445. Changed at that time and oriented to the unit.  Charge RN updated about patient refusing to follow orders. Refusing to wear TSLO brace and explained risk if not wearing TSLO brace. Expressing wanting to stand and pivot to the [facility]. However, patient expressed concerns of being a fall risk and stating to writer will you be able to catch me if I fall? Refusing to wear hospital socks for transfer. Refusing to use the gait belt for transfer. Requesting a RW for transfer. Encouraged and explained to patient for safety concerns will use a stedy to transfer to the [facility] [place]. Explained how the stedy works. After using the commode explained You used the stedy because easier for you but not easier for me you are not listening to me.  Patient endorsing to writer causing me to have an anxiety attack. Frustrated at Clinical research associate for disconnecting oxygen  tubing due to not being able to go with patient to the [facility] due to length. Patient raising her voice at Clinical research associate. Verbal deescalating and therapeutic communication utilized to help calm patient. Given PRN medication for pain. Initially refusing then wanting the scheduled pain medication. After using the [facility] refused pain medication due to side effects reported from taking the medication.

## 2024-02-06 NOTE — Progress Notes (Signed)
 PMR Admission Coordinator Pre-Admission Assessment   Patient: Kathleen [name] is an 63 y.o., female MRN: [id number] DOB: 1960-09-11 Height: 5' 10 (177.8 cm) Weight: 125.6 kg                                                                                                                                                  Insurance Information HMO: yes    PPO:      PCP:      IPA:      80/20:      OTHER:  PRIMARY: [employer]     Policy#: [id number]       Subscriber: Pt CM Name: Debria Fang      Phone#:  [phone]    Fax#: [phone] Pre-Cert#:[id number]       Employer:  pt. [employer] 6/11 for 6/12-6/18/25 Benefits:  Phone #:[phone]-option 1, spoke with Clinton Danas Date: 08/28/2023 - 08/26/2024 Deductible: does not have one OOP Max: $3,400 ($1,641.58 met) CIR: $195/day co-pay for days 1-6, $0/day days 7-90 SNF:  $0/day co-pay for days 1-20, $214/day co-pay for days 21-100; limited to 100 days/benefit period Outpatient: $5/visit co-pay; limited by medical necessity Home Health:  100% coverage DME: 80% coverage; 20% co-insurance Providers: in network  SECONDARY:       Policy#:       Phone#:    Artist:       Phone#:    The Data processing manager" for patients in Inpatient Rehabilitation Facilities with attached "Privacy Act Statement-Health Care Records" was provided and verbally reviewed with: Patient   Emergency Contact Information Contact Information       Name Relation Home Work Mobile    [place] Spouse     [phone]    Belma Boxer Daughter     [phone]         Other Contacts       Name Relation Home Work Mobile    Lee,Justine Mother     [phone]         Current Medical History  Patient Admitting Diagnosis: Debility, sacral and pubic fxs History of Present Illness: Kathleen [name] is a 63 year old right handed female with history of chronic hypoxic respiratory failure (5 L O2) interstitial lung disease followed by  pulmonary services Dr. Jolyne Needs at [facility], sarcoidosis maintained on chronic prednisone , Addison's disease maintained on Cortef  followed by endocrinology services Dr.Gebreselassie Nida, chronic hypotension, chronic anemia, chronic urinary incontinence, class III obesity BMI 39.75, bilateral hip AVN and not felt to be a surgical candidate, osteoporosis with multiple vertebral fractures on chronic narcotics followed at the [facility] pain clinic and recent admissions for intractable back pain due to compression fractures of L4 (4/12 - 4/16) and then L3 (5/9 - 5/14), readmitted 01/20/2024 with acute L1 fracture discharged to home 01/23/2024 with [facility] home health services.  Per chart review patient lives  with spouse.  1 level home one-step to entry.  Patient readmitted 01/25/2024 with increasing low back pain to [facility]  CT lumbar spine showed superior endplate fracture of L1 appearing stable with approximately 10 to 15% loss of height no retropulsion.  Stable superior endplate fractures of L3 and L4 and also 25 to 30% loss of height and minimal retropulsion of the posterior superior aspect of the vertebral bodies without significant associated canal stenosis.  CT of the pelvis showed acute left pubic symphyseal and right sacral ala compression fractures/L4 superior endplate fracture as well as avascular necrosis of the left femoral head with mild articular collapse.  Admission chemistries unremarkable except chloride 94 CO2 30 glucose 145 alkaline phosphatase 129, hemoglobin 9.1, urine culture greater than 100,000 E. coli.  Patient was transferred to [facility] due to increasing back pain.  Interventional radiology consulted/Dr. Alvira Josephs and discussed at length kyphoplasty which patient declined..  TLSO back brace as directed.  Hospital course patient did receive a 3-day course of intravenous ceftriaxone  for E. coli UTI 6/1 through 6/3.  She was cleared to begin Lovenox  for DVT prophylaxis.  Followed  the distance by critical care medicine for pulmonary sarcoidosis/chronic hypoxic respiratory failure and currently on 4 L of oxygen  nasal cannula.  Therapy evaluations completed due to patient's decreased functional mobility and intractable back pain was admitted for a comprehensive rehab program  Glasgow Coma Scale Score: 15   Patient's medical record from [facility] has been reviewed by the rehabilitation admission coordinator and physician.   Past Medical History      Past Medical History:  Diagnosis Date   Adrenal insufficiency (HCC)     Anemia     Anxiety     Avascular bone necrosis (HCC)     Breast fibrocystic disorder     Chronic pain     Encounter for long-term opiate analgesic use 02/02/2020    Patient has chronic pain with femoral head avascular necrosis.  Is under pain contract   Fibromyalgia     Gastroesophageal reflux disease     Hypothyroidism     Mitral valve prolapse     Orthostatic hypotension     Peripheral neuropathy     PONV (postoperative nausea and vomiting)     Restrictive lung disease      Moderate to severe   Sarcoidosis      Biopsy proven - [facility]   Sarcoidosis     SOB (shortness of breath)      chronic          Has the patient had major surgery during 100 days prior to admission? No   Family History  family history includes Bipolar disorder in her daughter; Breast cancer in her mother; Cardiomyopathy in her mother; Heart attack in her maternal grandfather, maternal grandmother, and paternal grandmother; Heart disease in her maternal aunt; Neurofibromatosis in her cousin; Prostate cancer in her father.     Current Medications   Current Medications    Current Facility-Administered Medications:    acetaminophen  (TYLENOL ) tablet 650 mg, 650 mg, Oral, Q6H PRN, 650 mg at 02/04/24 0259 **OR** acetaminophen  (TYLENOL ) suppository 650 mg, 650 mg, Rectal, Q6H PRN, Lovell Rubenstein, NP   albuterol  (PROVENTIL ) (2.5 MG/3ML) 0.083% nebulizer  solution 2.5 mg, 2.5 mg, Nebulization, Q4H PRN, Lovell Rubenstein, NP, 2.5 mg at 02/02/24 1402   arformoterol  (BROVANA ) nebulizer solution 15 mcg, 15 mcg, Nebulization, BID, Dewald, Jonathan B, MD, 15 mcg at 02/06/24 0821  budesonide  (PULMICORT ) nebulizer solution 0.5 mg, 0.5 mg, Nebulization, BID, Kathye Parkin L, NP, 0.5 mg at 02/06/24 1610   busPIRone  (BUSPAR ) tablet 15 mg, 15 mg, Oral, TID, Lovell Rubenstein, NP, 15 mg at 02/06/24 0856   Chlorhexidine  Gluconate Cloth 2 % PADS 6 each, 6 each, Topical, Daily, Gonfa, Taye T, MD, 6 each at 02/05/24 0933   cyanocobalamin (VITAMIN B12) injection 1,000 mcg, 1,000 mcg, Subcutaneous, Daily, Macdonald Savoy, MD, 1,000 mcg at 02/06/24 0913   cyclobenzaprine  (FLEXERIL ) tablet 5 mg, 5 mg, Oral, TID, Emokpae, Courage, MD, 5 mg at 02/06/24 0856   DULoxetine  (CYMBALTA ) DR capsule 60 mg, 60 mg, Oral, Daily, Lovell Rubenstein, NP, 60 mg at 02/06/24 0855   enoxaparin  (LOVENOX ) injection 60 mg, 60 mg, Subcutaneous, Q24H, Adefeso, Oladapo, DO, 60 mg at 02/06/24 0857   ferrous sulfate  tablet 325 mg, 325 mg, Oral, Weekly, Lovell Rubenstein, NP, 325 mg at 02/03/24 1028   folic acid (FOLVITE) tablet 1 mg, 1 mg, Oral, Daily, Macdonald Savoy, MD, 1 mg at 02/06/24 0856   furosemide  (LASIX ) tablet 20 mg, 20 mg, Oral, Q M,W,F, Kathye Parkin L, NP, 20 mg at 02/05/24 9604   hydrocortisone  (CORTEF ) tablet 10 mg, 10 mg, Oral, Q lunch, Kathye Parkin L, NP, 10 mg at 02/05/24 1238   hydrocortisone  (CORTEF ) tablet 20 mg, 20 mg, Oral, Daily, Kathye Parkin L, NP, 20 mg at 02/06/24 0912   HYDROmorphone  (DILAUDID ) injection 1 mg, 1 mg, Intravenous, Q3H PRN, Lovell Rubenstein, NP, 1 mg at 01/31/24 5409   hydrOXYzine  (ATARAX ) tablet 25 mg, 25 mg, Oral, TID PRN, Lovell Rubenstein, NP, 25 mg at 02/05/24 2338   lactulose  (CHRONULAC ) 10 GM/15ML solution 10 g, 10 g, Oral, Daily PRN, Lovell Rubenstein, NP   levothyroxine  (SYNTHROID ) tablet 50 mcg, 50 mcg, Oral, QAC breakfast, Lovell Rubenstein, NP, 50 mcg at 02/06/24 0607   lidocaine  (LIDODERM ) 5 % 1 patch, 1 patch, Transdermal, Q24H, Lovell Rubenstein, NP, 1 patch at 02/06/24 0841   linaclotide  (LINZESS ) capsule 72 mcg, 72 mcg, Oral, QAC breakfast, Lovell Rubenstein, NP, 72 mcg at 02/06/24 0903   midodrine  (PROAMATINE ) tablet 5 mg, 5 mg, Oral, BID WC, Lovell Rubenstein, NP, 5 mg at 02/06/24 0841   ondansetron  (ZOFRAN ) tablet 4 mg, 4 mg, Oral, Q6H PRN, 4 mg at 02/05/24 1416 **OR** ondansetron  (ZOFRAN ) injection 4 mg, 4 mg, Intravenous, Q6H PRN, Lovell Rubenstein, NP, 4 mg at 01/26/24 2131   oxyCODONE  (Oxy IR/ROXICODONE ) immediate release tablet 30 mg, 30 mg, Oral, Q4H PRN, Feliz Ortiz, Abraham, MD, 30 mg at 02/06/24 0607   pantoprazole  (PROTONIX ) EC tablet 40 mg, 40 mg, Oral, Daily, Lovell Rubenstein, NP, 40 mg at 02/06/24 0856   revefenacin  (YUPELRI ) nebulizer solution 175 mcg, 175 mcg, Nebulization, Daily, Wilfredo Hanly, MD, 175 mcg at 02/06/24 8119   rosuvastatin  (CRESTOR ) tablet 20 mg, 20 mg, Oral, Daily, Lovell Rubenstein, NP, 20 mg at 02/06/24 0856   senna-docusate (Senokot-S) tablet 2 tablet, 2 tablet, Oral, QHS, Lovell Rubenstein, NP, 2 tablet at 02/05/24 2143   tamsulosin  (FLOMAX ) capsule 0.4 mg, 0.4 mg, Oral, QPC supper, Kathye Parkin L, NP, 0.4 mg at 02/05/24 1726   Vitamin D  (Ergocalciferol ) (DRISDOL ) 1.25 MG (50000 UNIT) capsule 50,000 Units, 50,000 Units, Oral, Q Sun, Ellis, Allison L, NP, 50,000 Units at 02/02/24 1478     Patients Current Diet:  Diet Order  Diet regular Room service appropriate? Yes; Fluid consistency: Thin  Diet effective now                         Precautions / Restrictions Precautions Precautions: Fall Precaution/Restrictions Comments: TLSO brace (when ambulating) Restrictions Weight Bearing Restrictions Per Provider Order: No    Has the patient had 2 or more falls or a fall with injury in the past year?Yes   Prior Activity Level Limited Community (1-2x/wk):  Pt. went out for appointments   Prior Functional Level Prior Function Prior Level of Function : Needs assist Physical Assist : Mobility (physical), ADLs (physical) Mobility (physical): Bed mobility, Transfers, Gait, Stairs ADLs (physical): Dressing, Bathing, Toileting, IADLs Mobility Comments: short household distances using RW, scooter ADLs Comments: Pt reports she had not been home long endough from rehab to know what she could do, She had been getting herself to [facility]   Self Care: Did the patient need help bathing, dressing, using the toilet or eating?  Needed some help   Indoor Mobility: Did the patient need assistance with walking from room to room (with or without device)? Needed some help   Stairs: Did the patient need assistance with internal or external stairs (with or without device)? Needed some help   Functional Cognition: Did the patient need help planning regular tasks such as shopping or remembering to take medications? Needed some help   Patient Information Are you of Hispanic, Latino/a,or Spanish origin?: A. No, not of Hispanic, Latino/a, or Spanish origin What is your race?: A. White Do you need or want an interpreter to communicate with a doctor or health care staff?: 0. No   Patient's Response To:  Health Literacy and Transportation Is the patient able to respond to health literacy and transportation needs?: Yes Health Literacy - How often do you need to have someone help you when you read instructions, pamphlets, or other written material from your doctor or pharmacy?: Never In the past 12 months, has lack of transportation kept you from medical appointments or from getting medications?: No In the past 12 months, has lack of transportation kept you from meetings, work, or from getting things needed for daily living?: No   Home Assistive Devices / Equipment Home Equipment: Agricultural consultant (2 wheels), Art gallery manager, Information systems manager, Higher education careers adviser held shower head, BSC/3in1    Prior Device Use: Indicate devices/aids used by the patient prior to current illness, exacerbation or injury? Walker   Current Functional Level Cognition   Orientation Level: Oriented X4    Extremity Assessment (includes Sensation/Coordination)   Upper Extremity Assessment: Overall WFL for tasks assessed  Lower Extremity Assessment: Defer to PT evaluation     ADLs   Overall ADL's : Needs assistance/impaired Eating/Feeding: Set up, Sitting Grooming: Contact guard assist, Minimal assistance, Sitting Grooming Details (indicate cue type and reason): reliant on UE for sitting EOB on air mattress Upper Body Bathing: Minimal assistance, Sitting Lower Body Bathing: Maximal assistance, Total assistance, Sitting/lateral leans Upper Body Dressing : Sitting, Minimal assistance Lower Body Dressing: Maximal assistance, Total assistance, Sitting/lateral leans Toilet Transfer: Contact guard assist, Stand-pivot Toilet Transfer Details (indicate cue type and reason): inital +2 to come to stand, once in standing SPT with CGA Toileting- Clothing Manipulation and Hygiene: Total assistance, +2 for physical assistance, +2 for safety/equipment Toileting - Clothing Manipulation Details (indicate cue type and reason): +2 to come to stand and complete hygiene standing Functional mobility during ADLs: Contact guard assist General ADL Comments: Pt  anxious regarding self-care tasks, and requires encouragement to complete tasks     Mobility   Overal bed mobility: Needs Assistance Bed Mobility: Rolling, Sidelying to Sit, Sit to Sidelying Rolling: Contact guard assist Sidelying to sit: Min assist, Used rails, HOB elevated Supine to sit: Min assist, Used rails Sit to supine: Contact guard assist Sit to sidelying: Min assist, HOB elevated, Used rails General bed mobility comments: pt received EOB lying on back, min assist for trunk to pull to sitting     Transfers   Overall transfer level: Needs  assistance Equipment used: Rolling walker (2 wheels) Transfers: Sit to/from Stand, Bed to chair/wheelchair/BSC Sit to Stand: Mod assist, +2 physical assistance, From elevated surface Bed to/from chair/wheelchair/BSC transfer type:: Step pivot Step pivot transfers: Contact guard assist General transfer comment: +2 to come to stand, once in standing CGA to complete SPT     Ambulation / Gait / Stairs / Wheelchair Mobility   Ambulation/Gait Ambulation/Gait assistance: Min assist, Mod assist Gait Distance (Feet): 6 Feet Assistive device: Rolling walker (2 wheels) Gait Pattern/deviations: Decreased step length - left, Decreased stance time - right, Decreased stride length, Step-to pattern, Wide base of support General Gait Details: unable to progress Gait velocity: slow Pre-gait activities: marching at EOB     Posture / Balance Dynamic Sitting Balance Sitting balance - Comments: sitting EOB Balance Overall balance assessment: Needs assistance Sitting-balance support: Feet supported, No upper extremity supported Sitting balance-Leahy Scale: Fair Sitting balance - Comments: sitting EOB Standing balance support: Reliant on assistive device for balance, During functional activity, Bilateral upper extremity supported Standing balance-Leahy Scale: Poor Standing balance comment: with RW support     Special needs/care consideration Oxygen  3L  and Skin intact, TSLO brace        Previous Home Environment (from acute therapy documentation) Living Arrangements: Spouse/significant other  Lives With: Spouse Available Help at Discharge: Family, Available PRN/intermittently Type of Home: House Home Layout: One level Home Access: Stairs to enter Entrance Stairs-Rails: None Entrance Stairs-Number of Steps: 1 Bathroom Shower/Tub: Engineer, manufacturing systems: Handicapped height Bathroom Accessibility: Yes Home Care Services: No   Discharge Living Setting Plans for Discharge Living Setting:  Patient's home Type of Home at Discharge: House Discharge Home Layout: One level Discharge Home Access: Stairs to enter Entrance Stairs-Rails: None Entrance Stairs-Number of Steps: 1 Discharge Bathroom Shower/Tub: Tub/shower unit Discharge Bathroom Toilet: Handicapped height Discharge Bathroom Accessibility: Yes How Accessible: Accessible via walker Does the patient have any problems obtaining your medications?: No   Social/Family/Support Systems Patient Roles: Spouse Contact Information: [phone] Anticipated Caregiver: Irelynn Schermerhorn Caregiver Availability: 24/7 Discharge Plan Discussed with Primary Caregiver: Yes Is Caregiver In Agreement with Plan?: Yes Does Caregiver/Family have Issues with Lodging/Transportation while Pt is in Rehab?: No     Goals Patient/Family Goal for Rehab: PT/OT Min A to supervision Expected length of stay: 12-14 days Pt/Family Agrees to Admission and willing to participate: Yes Program Orientation Provided & Reviewed with Pt/Caregiver Including Roles  & Responsibilities: Yes     Decrease burden of Care through IP rehab admission: Not anticipated     Possible need for SNF placement upon discharge:not anticipated     Patient Condition: I have reviewed medical records from [facility] , spoken with CM, and patient. I met with patient at the bedside for inpatient rehabilitation assessment.  Patient will benefit from ongoing PT and OT, can actively participate in 3 hours of therapy a day 5 days of the week, and can make  measurable gains during the admission.  Patient will also benefit from the coordinated team approach during an Inpatient Acute Rehabilitation admission.  The patient will receive intensive therapy as well as Rehabilitation physician, nursing, social worker, and care management interventions.  Due to safety, skin/wound care, disease management, medication administration, pain management, and patient education the patient  requires 24 hour a day rehabilitation nursing.  The patient is currently min A with mobility and basic ADLs. No significant changes since rehab MD consult on 02/04/24. Discharge setting and therapy post discharge at home with home health is anticipated.  Patient has agreed to participate in the Acute Inpatient Rehabilitation Program and will admit today.   Preadmission Screen Completed By:  Dorena Gander, CCC-SLP, 02/06/2024 10:11 AM ______________________________________________________________________   Discussed status with Dr. Lovorn on 02/06/24 at 900 and received approval for admission today.   Admission Coordinator:  Dorena Gander, time 930/Date 02/06/24

## 2024-02-06 NOTE — Progress Notes (Signed)
 Inpatient Rehabilitation Admission Medication Review by a Pharmacist  A complete drug regimen review was completed for this patient to identify any potential clinically significant medication issues.  High Risk Drug Classes Is patient taking? Indication by Medication  Antipsychotic No   Anticoagulant Yes Lovenox  - VTE ppx  Antibiotic No   Opioid Yes Oxycodone  prn pain  Antiplatelet No   Hypoglycemics/insulin  No   Vasoactive Medication Yes Furosemide  - fluid Midodrine - low BP Tamsulosin  - urinary retention  Chemotherapy No   Other Yes Ondansetron  prn N/V Cyclobenzaprine  prn spasms  Buspirone , prn hydroxyzine  - anxiety  Levothyroxine  - low thyroid   Lactulose , linaclotide  - constipation  Duloxetine  - mood, pain, anxiety  Ferrous sulfate , folic acid, Vitamin D - supplement  Lidocaine  - pain  Hydrocortisone  - sarcoidosis  Rosuvastatin  - HLD  Pantoprazole  - reflux  Albuterol , Brovana , budesonide , revenfenacin - COPD, sarcoidosis     Type of Medication Issue Identified Description of Issue Recommendation(s)  Drug Interaction(s) (clinically significant)     Duplicate Therapy     Allergy     No Medication Administration End Date     Incorrect Dose     Additional Drug Therapy Needed     Significant med changes from prior encounter (inform family/care partners about these prior to discharge).    Other       Clinically significant medication issues were identified that warrant physician communication and completion of prescribed/recommended actions by midnight of the next day:  No  Name of provider notified for urgent issues identified:   Provider Method of Notification:     Pharmacist comments: None  Time spent performing this drug regimen review (minutes): 20 minutes  Thank you. Lennice Quivers, PharmD

## 2024-02-06 NOTE — Progress Notes (Signed)
 AVS completed for discharge packet and placed with chart.

## 2024-02-06 NOTE — H&P (Signed)
 Physical Medicine and Rehabilitation Admission H&P        Chief Complaint  Patient presents with   Back Pain  : HPI: Kathleen [name] is a 63 year old right handed female with history of chronic hypoxic respiratory failure (5 L O2) interstitial lung disease followed by pulmonary services Dr. Jolyne Needs at [facility], sarcoidosis maintained on chronic prednisone , Addison's disease maintained on Cortef  followed by endocrinology services Dr.Gebreselassie Nida, chronic hypotension, chronic anemia, chronic urinary incontinence, class III obesity BMI 39.75, bilateral hip AVN and not felt to be a surgical candidate, osteoporosis with multiple vertebral fractures on chronic narcotics followed at the [facility] pain clinic and recent admissions for intractable back pain due to compression fractures of L4 (4/12 - 4/16) and then L3 (5/9 - 5/14), readmitted 01/20/2024 with acute L1 fracture discharged to home 01/23/2024 with [place] home health services.  Per chart review patient lives with spouse.  1 level home one-step to entry.  Patient readmitted 01/25/2024 with increasing low back pain to [facility].  CT lumbar spine showed superior endplate fracture of L1 appearing stable with approximately 10 to 15% loss of height no retropulsion.  Stable superior endplate fractures of L3 and L4 and also 25 to 30% loss of height and minimal retropulsion of the posterior superior aspect of the vertebral bodies without significant associated canal stenosis.  CT of the pelvis showed acute left pubic symphyseal and right sacral ala compression fractures/L4 superior endplate fracture as well as avascular necrosis of the left femoral head with mild articular collapse.  Admission chemistries unremarkable except chloride 94 CO2 30 glucose 145 alkaline phosphatase 129, hemoglobin 9.1, urine culture greater than 100,000 E. coli.  Patient was transferred to [facility] due to increasing back pain.  Interventional radiology  consulted/Dr. Alvira Josephs and discussed at length kyphoplasty which patient declined..  TLSO back brace as directed.  Hospital course patient did receive a 3-day course of intravenous ceftriaxone  for E. coli UTI 6/1 through 6/3.  She was cleared to begin Lovenox  for DVT prophylaxis.  Followed the distance by critical care medicine for pulmonary sarcoidosis/chronic hypoxic respiratory failure and currently on 4 L of oxygen  nasal cannula.  Therapy evaluations completed due to patient's decreased functional mobility and intractable back pain was admitted for a comprehensive rehab program.   Pt reports she gets anxious frequently- and only gets Vistaril - although she's on Duloxetine  and Buspar  15 mg TID as well. She reports she forgot about these meds.  But she feels her anxiety is really bad here in hospital- per the therapy notes, it's really impacting her ability to participate in therapy.  When offered Paxil- she initially said she had never taken it, but then said it made her sick to her stomach.    She willing to try Celexa  low dose with Duloxetine  to help prevent her anxiety- she was made aware that it's to prevent not for rescue anxiety help.   Her LBM was yesterday on BSC_ reports no issues with constipation.  Says cannot function without pain meds, but it doesn't make pain go away- we discussed how with the multiple fractures she has, pain will be an issue and to increase pain meds would increase her risk of respiratory depression.    She has foley, but it appears they are removing it today.      Review of Systems  Constitutional:  Negative for chills and fever.  HENT:  Negative for hearing loss.   Eyes:  Negative for  blurred vision and double vision.  Respiratory:  Positive for shortness of breath. Negative for wheezing.   Cardiovascular:  Positive for leg swelling. Negative for chest pain and palpitations.  Gastrointestinal:  Negative for constipation, heartburn, nausea and vomiting.        GERD  Genitourinary:  Negative for dysuria, flank pain and hematuria.       Chronic urinary incontinence  Musculoskeletal:  Positive for back pain, joint pain and myalgias.  Skin:  Negative for rash.  Neurological:  Positive for sensory change and weakness.  Psychiatric/Behavioral:  Positive for depression and memory loss. The patient is nervous/anxious.        Anxiety  All other systems reviewed and are negative.       Past Medical History:  Diagnosis Date   Adrenal insufficiency (HCC)     Anemia     Anxiety     Avascular bone necrosis (HCC)     Breast fibrocystic disorder     Chronic pain     Encounter for long-term opiate analgesic use 02/02/2020    Patient has chronic pain with femoral head avascular necrosis.  Is under pain contract   Fibromyalgia     Gastroesophageal reflux disease     Hypothyroidism     Mitral valve prolapse     Orthostatic hypotension     Peripheral neuropathy     PONV (postoperative nausea and vomiting)     Restrictive lung disease      Moderate to severe   Sarcoidosis      Biopsy proven - [facility]   Sarcoidosis     SOB (shortness of breath)      chronic             Past Surgical History:  Procedure Laterality Date   BIOPSY   10/17/2018    Procedure: BIOPSY;  Surgeon: Ruby Corporal, MD;  Location: [facility];  Service: Endoscopy;;  duodenum, antrum, gastric body   BIOPSY   03/03/2021    Procedure: BIOPSY;  Surgeon: Ruby Corporal, MD;  [facility]: [facility];  Service: Endoscopy;;   BIOPSY   09/11/2023    Procedure: BIOPSY;  Surgeon: Suzette Espy, MD;  [facility]: [facility];  Service: Endoscopy;;   BREAST LUMPECTOMY Bilateral 01/12/2011   CHOLECYSTECTOMY N/A 04/17/2021    Procedure: LAPAROSCOPIC CHOLECYSTECTOMY;  Surgeon: Alanda Allegra, MD;  Location: [facility] OR[facility];  Service: General;  Laterality: N/A;   COLONOSCOPY WITH PROPOFOL  N/A 01/03/2022    Procedure: COLONOSCOPY WITH PROPOFOL ;  Surgeon: Ruby Corporal, MD;  Location: [facility];  Service: Endoscopy;  Laterality: N/A;  220   ESOPHAGEAL DILATION N/A 03/03/2021    Procedure: ESOPHAGEAL DILATION;  Surgeon: Ruby Corporal, MD;  [facility]: [facility];  Service: Endoscopy;  Laterality: N/A;   ESOPHAGOGASTRODUODENOSCOPY (EGD) WITH PROPOFOL  N/A 10/17/2018    Procedure: ESOPHAGOGASTRODUODENOSCOPY (EGD) WITH PROPOFOL ;  Surgeon: Ruby Corporal, MD;  [facility];  Service: Endoscopy;  Laterality: N/A;  2:25   ESOPHAGOGASTRODUODENOSCOPY (EGD) WITH PROPOFOL  N/A 03/03/2021    Procedure: ESOPHAGOGASTRODUODENOSCOPY (EGD) WITH PROPOFOL ;  Surgeon: Ruby Corporal, MD;  [facility];  Service: Endoscopy;  Laterality: N/A;  12:15   ESOPHAGOGASTRODUODENOSCOPY (EGD) WITH PROPOFOL  N/A 09/11/2023    Procedure: ESOPHAGOGASTRODUODENOSCOPY (EGD) WITH PROPOFOL ;  Surgeon: Suzette Espy, MD;  [facility];  Service: Endoscopy;  Laterality: N/A;  2:30 pm, asa 3, LM to see if pt can come earlier   [facility] DILATION N/A  09/11/2023    Procedure: Londa Rival DILATION;  Surgeon: Suzette Espy, MD;  [facility];  Service: Endoscopy;  Laterality: N/A;   ORIF ANKLE FRACTURE Left 03/10/2023    Procedure: OPEN REDUCTION INTERNAL FIXATION (ORIF) ANKLE FRACTURE;  Surgeon: Diedra Fowler, MD;  Location: [facility] OR;  Service: Orthopedics;  Laterality: Left;   POLYPECTOMY   01/03/2022    Procedure: POLYPECTOMY INTESTINAL;  Surgeon: Ruby Corporal, MD;  [facility];  Service: Endoscopy;;   TUBAL LIGATION                 Family History  Problem Relation Age of Onset   Cardiomyopathy Mother     Breast cancer Mother     Prostate cancer Father     Heart attack Maternal Grandmother     Heart attack Maternal Grandfather     Heart attack Paternal Grandmother     Bipolar disorder Daughter     Heart disease Maternal Aunt     Neurofibromatosis Cousin     Colon cancer Neg Hx          Social History:  reports that she has never smoked. She has  never been exposed to tobacco smoke. She has never used smokeless tobacco. She reports that she does not drink alcohol and does not use drugs. Allergies:  Allergies       Allergies  Allergen Reactions   Bee Venom Anaphylaxis   Contrast Media [Iodinated Contrast Media] Other (See Comments)      CT chest w/ contrast at OSH followed by SOB resolved w/ single dose steroids, never ENT swelling, intubation or pressors, has had multiple contrasted scans since   Morphine  Other (See Comments)      Substance with morphinan structure and opioid receptor agonist mechanism of action (substance)   Bactrim  [Sulfamethoxazole -Trimethoprim ] Nausea And Vomiting   Codeine Itching            Medications Prior to Admission  Medication Sig Dispense Refill   acetaminophen  (TYLENOL ) 500 MG tablet Take 2 tablets (1,000 mg total) by mouth 3 (three) times daily. 30 tablet 0   albuterol  (PROVENTIL ) (2.5 MG/3ML) 0.083% nebulizer solution Inhale 3 mL (2.5 mg total) by nebulization every four (4) hours as needed for wheezing or shortness of breath. 75 mL 3   budesonide  (PULMICORT ) 0.5 MG/2ML nebulizer solution Inhale the contents of 1 vial via nebulizer two (2) times a day. 360 mL 3   busPIRone  (BUSPAR ) 15 MG tablet Take 1 tablet (15 mg) by mouth 3 times daily. 90 tablet 4   calcitonin, salmon, (MIACALCIN /FORTICAL) 200 UNIT/ACT nasal spray Use 1 spray in one nostril per day, alternate nostrils every other day (may have 1 vial she should use this for at least 2 weeks but may use as long as 4 weeks) 3.7 mL 0   cyclobenzaprine  (FLEXERIL ) 5 MG tablet Take 1 tablet (5 mg total) by mouth 3 (three) times daily. 90 tablet 2   DULoxetine  (CYMBALTA ) 60 MG capsule Take 1 capsule (60 mg total) by mouth daily. 30 capsule 5   EPINEPHrine  0.3 mg/0.3 mL IJ SOAJ injection Inject 0.3 mg into the muscle as needed for anaphylaxis. 1 each 2   ferrous sulfate  325 (65 FE) MG EC tablet Take 325 mg by mouth once a week.       furosemide  (LASIX )  20 MG tablet Take 1 tablet (20 mg total) by mouth every Monday, Wednesday, and Friday. 40 tablet 3   hydrocortisone  (CORTEF ) 10  MG tablet Take 2 tablets by mouth at 8:00 am and 1 tablet at lunch. (May double dose on sick days x 3-5 days) 120 tablet 3   hydrOXYzine  (ATARAX ) 25 MG tablet Take 1 tablet (25 mg total) by mouth 3 (three) times daily as needed for anxiety. 90 tablet 1   ipratropium-albuterol  (DUONEB) 0.5-2.5 (3) MG/3ML SOLN Take 3 mLs by nebulization every 8 (eight) hours. 270 mL 11   lactulose  (CHRONULAC ) 10 GM/15ML solution Take 15 mLs (10 g total) by mouth daily as needed for mild constipation. 237 mL 2   levothyroxine  (SYNTHROID ) 50 MCG tablet Take 1 tablet by mouth daily before breakfast. 90 tablet 1   lidocaine  (LIDODERM ) 5 % Place 1 patch onto the skin daily. Remove & Discard patch within 12 hours or as directed by MD 30 patch 0   LINZESS  72 MCG capsule Take 1 capsule (72 mcg total) by mouth daily before breakfast. 30 capsule 3   midodrine  (PROAMATINE ) 5 MG tablet Take 1 tablet (5 mg total) by mouth 2 (two) times daily with a meal. 60 tablet 5   ondansetron  (ZOFRAN -ODT) 8 MG disintegrating tablet Dissolve 1 tablet (8 mg total) by mouth every 8 (eight) hours as needed. (Patient taking differently: Take 8 mg by mouth every 8 (eight) hours as needed for nausea or vomiting.) 15 tablet 5   oxyCODONE  (ROXICODONE ) 15 MG immediate release tablet Take 1 tablet (15 mg total) by mouth every 4 (four) hours as needed for moderate pain (pain score 4-6) or severe pain (pain score 7-10). 45 tablet 0   OXYGEN  Inhale 5 L into the lungs continuous.       pantoprazole  (PROTONIX ) 40 MG tablet Take 1 tablet (40 mg total) by mouth daily. 30 tablet 5   polyethylene glycol (MIRALAX  / GLYCOLAX ) 17 g packet Take 17 g by mouth 2 (two) times daily. 60 each 4   predniSONE  (DELTASONE ) 5 MG tablet Take 1 tablet (5 mg total) by mouth in the morning. 30 tablet 5   rosuvastatin  (CRESTOR ) 20 MG tablet Take 1 tablet (20  mg total) by mouth daily. 90 tablet 1   senna-docusate (SENOKOT-S) 8.6-50 MG tablet Take 2 tablets by mouth at bedtime. 60 tablet 3   tamsulosin  (FLOMAX ) 0.4 MG CAPS capsule Take 1 capsule (0.4 mg total) by mouth daily after supper. 30 capsule 11   Vitamin D , Ergocalciferol , (DRISDOL ) 1.25 MG (50000 UNIT) CAPS capsule Take 1 capsule (50,000 Units total) by mouth every Sunday. 5 capsule 5              Home: Home Living Family/patient expects to be discharged to:: Private residence Living Arrangements: Spouse/significant other Available Help at Discharge: Family, Available PRN/intermittently Type of Home: House Home Access: Stairs to enter Secretary/administrator of Steps: 1 Entrance Stairs-Rails: None Home Layout: One level Bathroom Shower/Tub: Engineer, manufacturing systems: Handicapped height Bathroom Accessibility: Yes Home Equipment: Agricultural consultant (2 wheels), Art gallery manager, Information systems manager, Higher education careers adviser held shower head, BSC/3in1  Lives With: Spouse   Functional History: Prior Function Prior Level of Function : Needs assist Physical Assist : Mobility (physical), ADLs (physical) Mobility (physical): Bed mobility, Transfers, Gait, Stairs ADLs (physical): Dressing, Bathing, Toileting, IADLs Mobility Comments: short household distances using RW, scooter ADLs Comments: Pt reports she had not been home long endough from rehab to know what she could do, She had been getting [facility]   Functional Status:  Mobility: Bed Mobility Overal bed mobility: Needs Assistance Bed Mobility: Rolling, Sidelying to  Sit, Sit to Sidelying Rolling: Contact guard assist Sidelying to sit: Min assist, Used rails, HOB elevated Supine to sit: Min assist, Used rails Sit to supine: Contact guard assist Sit to sidelying: Min assist, HOB elevated, Used rails General bed mobility comments: pt received EOB lying on back, min assist for trunk to pull to sitting Transfers Overall transfer level: Needs  assistance Equipment used: Rolling walker (2 wheels) Transfers: Sit to/from Stand, Bed to chair/wheelchair/BSC Sit to Stand: Mod assist, +2 physical assistance, From elevated surface Bed to/from chair/wheelchair/BSC transfer type:: Step pivot Step pivot transfers: Contact guard assist General transfer comment: +2 to come to stand, once in standing CGA to complete SPT Ambulation/Gait Ambulation/Gait assistance: Min assist, Mod assist Gait Distance (Feet): 6 Feet Assistive device: Rolling walker (2 wheels) Gait Pattern/deviations: Decreased step length - left, Decreased stance time - right, Decreased stride length, Step-to pattern, Wide base of support General Gait Details: unable to progress Gait velocity: slow Pre-gait activities: marching at EOB   ADL: ADL Overall ADL's : Needs assistance/impaired Eating/Feeding: Set up, Sitting Grooming: Contact guard assist, Minimal assistance, Sitting Grooming Details (indicate cue type and reason): reliant on UE for sitting EOB on air mattress Upper Body Bathing: Minimal assistance, Sitting Lower Body Bathing: Maximal assistance, Total assistance, Sitting/lateral leans Upper Body Dressing : Sitting, Minimal assistance Lower Body Dressing: Maximal assistance, Total assistance, Sitting/lateral leans Toilet Transfer: Contact guard assist, Stand-pivot Toilet Transfer Details (indicate cue type and reason): inital +2 to come to stand, once in standing SPT with CGA Toileting- Clothing Manipulation and Hygiene: Total assistance, +2 for physical assistance, +2 for safety/equipment Toileting - Clothing Manipulation Details (indicate cue type and reason): +2 to come to stand and complete hygiene standing Functional mobility during ADLs: Contact guard assist General ADL Comments: Pt anxious regarding self-care tasks, and requires encouragement to complete tasks   Cognition: Cognition Orientation Level: Oriented X4 Cognition Arousal: Alert Behavior  During Therapy: Anxious   Physical Exam: Blood pressure 100/71, pulse 86, temperature 97.7 F (36.5 C), temperature source Oral, resp. rate 17, height 5' 10 (1.778 m), weight 125.6 kg, last menstrual period 05/17/2016, SpO2 100%. Physical Exam Vitals and nursing note reviewed. Exam conducted with a chaperone present.  Constitutional:      Comments: Pt's husband at bedside; pt awake, but extremely anxious at rest with no stimuli; increased with any question; was anxious that couldn't see husband from her position rolled on L side- and husband in chair on right, No acute distress  HENT:     Head: Normocephalic and atraumatic.     Right Ear: External ear normal.     Left Ear: External ear normal.     Nose: Nose normal. No congestion.     Comments: 3-4L O2 by Titus- high flow O2    Mouth/Throat:     Mouth: Mucous membranes are dry.     Pharynx: No oropharyngeal exudate.    Eyes:     General:        Right eye: No discharge.        Left eye: No discharge.     Extraocular Movements: Extraocular movements intact.      Cardiovascular:     Rate and Rhythm: Normal rate and regular rhythm.     Heart sounds: Normal heart sounds. No murmur heard.    No gallop.     Comments: RRR- almost bradcyardic Pulmonary:     Comments: Appears SOB at rest- but hard to tell if is anxiety/or component of that  compared to baseline SOB Has raspy cough intermittently Decreased breath sounds at bases- worse on LLL Coarse wheezing breath sounds throughout Abdominal:     Comments: Morbidly obese- most of weight in trunk- abd appears distended and firm- pt says it's at baseline- normoactive; NT    Musculoskeletal:     Cervical back: Tenderness present.     Comments: Trace LE edema to ankles- pt kept repeating that has a lot of fluid- but don't see much fluid Ue's5-/5 in Deltoids, biceps, triceps; WE 4/5- give way weakness; Grip 4+/5 and FA 4+/5 B/L LE's- 5-/5 throughout B/L    Skin:    General: Skin is warm  and dry.     Comments: IV R forearm- looks OK    Neurological:     Comments: Patient is alert sitting up in bed.  Oriented x 3 and follows commands. Very vague- kept asking husbands questions about meds- and asking if he knew anything about MS Contin , Celexa , etc- hard to gauge if anxiety, or decreased cognition Intact to light touch in all 4 extremities and face  Psychiatric:     Comments: Moderate anxiety at baseline and immediately revs up to high anxiety with any questions        Lab Results Last 48 Hours  No results found. However, due to the size of the patient record, not all encounters were searched. Please check Results Review for a complete set of results.   Imaging Results (Last 48 hours)  No results found.         Blood pressure 100/71, pulse 86, temperature 97.7 F (36.5 C), temperature source Oral, resp. rate 17, height 5' 10 (1.778 m), weight 125.6 kg, last menstrual period 05/17/2016, SpO2 100%.   Medical Problem List and Plan: 1. Functional deficits secondary to compression and multiple fractures of spine without SCI-intractable acute on chronic low back pain due to osteoporotic fracture involving L1, L3 and L4/acute left pubic symphysis and right sacral ala fracture.  Patient declined kyphoplasty.  Conservative management with narcotics and TLSO             -patient may  shower             -ELOS/Goals: 12-14 days- supervision to min A             Admit to CIR 2.  Antithrombotics: -DVT/anticoagulation:  Pharmaceutical: Lovenox .  Check vascular study             -antiplatelet therapy: N/A 3. Pain Management/chronic pain followed at [facility] pain clinic- usually takes 10 mg 6x/day: Flexeril  5 mg 3 times daily, lidoderm  patch, oxycodone  30 mg every 4 hours as needed upon transfer- attempting, with d/w pt and husband, to add MS Contin  15 mg BID and reduce Oxycodone  to 20 mg q4 hours IF pt ABSOLUTELY refuses to take MS Contin  after we discussed it at length, we can try to  change to Oxycontin , however it's unlikely her insurance will cover at d/c.  4. Mood/Behavior/Sleep: Cymbalta  60 mg daily, BuSpar  15 mg 3 times daily, Atarax  25 mg 3 times daily as needed             -will add Celexa  10 mg daily for anxiety- is better for anxiety than Duloxetine , but need Duloxetine  for pain control             -antipsychotic agents: N/A 5. Neuropsych/cognition: This patient is ?capable of making decisions on her own behalf. 6. Skin/Wound Care: Routine skin checks 7. Fluids/Electrolytes/Nutrition: Routine  in and outs with follow-up chemistries 8.  Sarcoidosis/interstitial lung disease/chronic respiratory failure with hypoxia- Was on 4-5L at home- been doing 3-4L here- and concern for hypercarbia.  Continue chronic oxygen  therapy.  Prednisone  as directed, Lasix  20 mg Monday Wednesday Fridays.  Followed by Dr. Jolyne Needs pulmonary services [facility] 9.  Left femoral head avascular necrosis.Probably has bilaterally, based on chart review  Not a surgical candidate.  Conservative care 10.  E. coli UTI as well as chronic urinary incontinence.  Status post 3-day course of IV Rocephin  completed.  Continue Flomax  0.4 mg daily.  Check PVR 11.  Normocytic anemia.  Borderline MCV of 96 and RDW of 16.  B12 and folate within normal limits.  Check CBC 12.  Addison's disease.  Continue hydrocortisone  chronically.  Followed by endocrinology services Dr.Gebreselassie Nida 13.  Chronic hypotension.  ProAmatine  5 mg twice daily 14.  Class III morbid obesity.  BMI 39.75.  Dietary follow-up 15.  Hyperlipidemia.  Crestor  16.  Hypothyroidism.  Synthroid  17.  Chronic constipation.  Linzess  72 mcg daily, Senokot S2 tablets bedtime, Chronulac  as needed 18.  GERD.  Protonix  19. Noncompliance- Per nursing, pt refusing to use TLSO for [facility] transfers, of note- will d/w pt that need to follow recs, or there's more risk of additional fractures.     Everlyn Hockey Angiulli, PA-C 02/06/2024     I have personally performed  a face to face diagnostic evaluation of this patient and formulated the key components of the plan.  Additionally, I have personally reviewed laboratory data, imaging studies, as well as relevant notes and concur with the physician assistant's documentation above.   The patient's status has not changed from the original H&P.  Any changes in documentation from the acute care chart have been noted above.

## 2024-02-06 NOTE — Plan of Care (Signed)
   Problem: Education: Goal: Knowledge of General Education information will improve Description: Including pain rating scale, medication(s)/side effects and non-pharmacologic comfort measures Outcome: Progressing   Problem: Elimination: Goal: Will not experience complications related to bowel motility Outcome: Progressing   Problem: Pain Managment: Goal: General experience of comfort will improve and/or be controlled Outcome: Progressing   Problem: Safety: Goal: Ability to remain free from injury will improve Outcome: Progressing

## 2024-02-06 NOTE — Discharge Instructions (Addendum)
 Inpatient Rehab Discharge Instructions  Kathleen [name] Discharge date and time: No discharge date for patient encounter.   Activities/Precautions/ Functional Status: Activity: TLSO back brace as directed Diet: Regular Wound Care: Routine skin checks Functional status:  ___ No restrictions     ___ Walk up steps independently ___ 24/7 supervision/assistance   ___ Walk up steps with assistance ___ Intermittent supervision/assistance  ___ Bathe/dress independently ___ Walk with walker     _x__ Bathe/dress with assistance ___ Walk Independently    ___ Shower independently ___ Walk with assistance    ___ Shower with assistance ___ No alcohol     ___ Return to work/school ________  Special Instructions: No driving smoking or alcohol  Continue chronic oxygen  therapy as well as CPAP  COMMUNITY REFERRALS UPON DISCHARGE:    Home Health:   PT   OT     RN  AIDE                Agency: [facility]   Phone:[phone]    Medical Equipment/Items Ordered:HAS ALL NEEDED EQUIPMENT FROM PAST ADMISSIONS                                                 Agency/Supplier:NA     My questions have been answered and I understand these instructions. I will adhere to these goals and the provided educational materials after my discharge from the hospital.  Patient/Caregiver Signature _______________________________ Date __________  Clinician Signature _______________________________________ Date __________  Please bring this form and your medication list with you to all your follow-up doctor's appointments.

## 2024-02-07 ENCOUNTER — Inpatient Hospital Stay (HOSPITAL_COMMUNITY)

## 2024-02-07 ENCOUNTER — Ambulatory Visit: Payer: Self-pay | Admitting: Family Medicine

## 2024-02-07 DIAGNOSIS — S32000G Wedge compression fracture of unspecified lumbar vertebra, subsequent encounter for fracture with delayed healing: Secondary | ICD-10-CM

## 2024-02-07 DIAGNOSIS — M7989 Other specified soft tissue disorders: Secondary | ICD-10-CM | POA: Diagnosis not present

## 2024-02-07 DIAGNOSIS — M549 Dorsalgia, unspecified: Secondary | ICD-10-CM | POA: Diagnosis not present

## 2024-02-07 LAB — COMPREHENSIVE METABOLIC PANEL WITH GFR
ALT: 25 U/L (ref 0–44)
AST: 23 U/L (ref 15–41)
Albumin: 2.7 g/dL — ABNORMAL LOW (ref 3.5–5.0)
Alkaline Phosphatase: 89 U/L (ref 38–126)
Anion gap: 9 (ref 5–15)
BUN: <5 mg/dL — ABNORMAL LOW (ref 8–23)
CO2: 36 mmol/L — ABNORMAL HIGH (ref 22–32)
Calcium: 8.8 mg/dL — ABNORMAL LOW (ref 8.9–10.3)
Chloride: 97 mmol/L — ABNORMAL LOW (ref 98–111)
Creatinine, Ser: 0.9 mg/dL (ref 0.44–1.00)
GFR, Estimated: >60 mL/min (ref >60)
Glucose, Bld: 104 mg/dL — ABNORMAL HIGH (ref 70–99)
Potassium: 3.4 mmol/L — ABNORMAL LOW (ref 3.5–5.1)
Sodium: 142 mmol/L (ref 135–145)
Total Bilirubin: 0.6 mg/dL (ref 0.0–1.2)
Total Protein: 5.4 g/dL — ABNORMAL LOW (ref 6.5–8.1)

## 2024-02-07 LAB — CBC WITH DIFFERENTIAL/PLATELET
Abs Immature Granulocytes: 0.16 10*3/uL — ABNORMAL HIGH (ref 0.00–0.07)
Basophils Absolute: 0.1 10*3/uL (ref 0.0–0.1)
Basophils Relative: 1 %
Eosinophils Absolute: 0.5 10*3/uL (ref 0.0–0.5)
Eosinophils Relative: 7 %
HCT: 28.8 % — ABNORMAL LOW (ref 36.0–46.0)
Hemoglobin: 8.3 g/dL — ABNORMAL LOW (ref 12.0–15.0)
Immature Granulocytes: 2 %
Lymphocytes Relative: 9 %
Lymphs Abs: 0.7 10*3/uL (ref 0.7–4.0)
MCH: 28.2 pg (ref 26.0–34.0)
MCHC: 28.8 g/dL — ABNORMAL LOW (ref 30.0–36.0)
MCV: 98 fL (ref 80.0–100.0)
Monocytes Absolute: 0.8 10*3/uL (ref 0.1–1.0)
Monocytes Relative: 11 %
Neutro Abs: 5.2 10*3/uL (ref 1.7–7.7)
Neutrophils Relative %: 70 %
Platelets: 454 10*3/uL — ABNORMAL HIGH (ref 150–400)
RBC: 2.94 MIL/uL — ABNORMAL LOW (ref 3.87–5.11)
RDW: 16.3 % — ABNORMAL HIGH (ref 11.5–15.5)
WBC: 7.4 10*3/uL (ref 4.0–10.5)
nRBC: 0.3 % — ABNORMAL HIGH (ref 0.0–0.2)

## 2024-02-07 MED ORDER — OXYCODONE HCL ER 10 MG PO T12A
10.0000 mg | EXTENDED_RELEASE_TABLET | Freq: Two times a day (BID) | ORAL | Status: DC
  Administered 2024-02-07 – 2024-02-10 (×5): 10 mg via ORAL
  Filled 2024-02-07 (×7): qty 1

## 2024-02-07 MED ORDER — POTASSIUM CHLORIDE CRYS ER 20 MEQ PO TBCR
40.0000 meq | EXTENDED_RELEASE_TABLET | Freq: Once | ORAL | Status: AC
  Administered 2024-02-07: 40 meq via ORAL
  Filled 2024-02-07: qty 2

## 2024-02-07 NOTE — Progress Notes (Signed)
 PROGRESS NOTE   Subjective/Complaints: Pt reports needs something for nausea- doesn't like Compazine  since causes a HA.   Hurting a lot this AM Upset that O2 level was decreased to 2L- I explained our goal is to keep her O2 sats 88-92%, but usually that would mean 3-4L- she voiced understanding.   Also was called this afternoon by Nurse that pt doesn't want MS Contin - makes her drowsy- will change to Oxycontin  10 mg BID  ROS:  Pt denies SOB, abd pain, CP, N/V/C/D, and vision changes Anxiety- is still bad Pain not controlled   Objective:   VAS US  LOWER EXTREMITY VENOUS (DVT) Result Date: 02/07/2024  Lower Venous DVT Study Patient Name:  Kathleen [name]  Date of Exam:   02/07/2024 Medical Rec #: [id number]       Accession #:    [id number] Date of Birth: 07/22/1961       Patient Gender: F Patient Age:   63 years Exam Location:  [facility] Procedure:      VAS US  LOWER EXTREMITY VENOUS (DVT) Referring Phys: Georjean Kite --------------------------------------------------------------------------------  Indications: Pain, and Swelling. Other Indications: Rehab patient. Limitations: Body habitus, poor ultrasound/tissue interface and pain intolerance. Comparison Study: Previous exam on 02/26/2023 was negative for DVT. Performing Technologist: Arlyce Berger RVT, RDMS  Examination Guidelines: A complete evaluation includes B-mode imaging, spectral Doppler, color Doppler, and power Doppler as needed of all accessible portions of each vessel. Bilateral testing is considered an integral part of a complete examination. Limited examinations for reoccurring indications may be performed as noted. The reflux portion of the exam is performed with the patient in reverse Trendelenburg.  +---------+---------------+---------+-----------+----------+-------------------+ RIGHT    CompressibilityPhasicitySpontaneityPropertiesThrombus Aging       +---------+---------------+---------+-----------+----------+-------------------+ CFV      Full           Yes      Yes                                      +---------+---------------+---------+-----------+----------+-------------------+ SFJ      Full                                                             +---------+---------------+---------+-----------+----------+-------------------+ FV Prox  Full           Yes      Yes                                      +---------+---------------+---------+-----------+----------+-------------------+ FV Mid   Full           Yes      Yes                                      +---------+---------------+---------+-----------+----------+-------------------+  FV DistalFull           Yes      Yes                                      +---------+---------------+---------+-----------+----------+-------------------+ PFV                     Yes      Yes                                      +---------+---------------+---------+-----------+----------+-------------------+ POP      Full           Yes      Yes                                      +---------+---------------+---------+-----------+----------+-------------------+ PTV      Full                                                             +---------+---------------+---------+-----------+----------+-------------------+ PERO                                                  Not well visualized +---------+---------------+---------+-----------+----------+-------------------+   +---------+---------------+---------+-----------+----------+-------------------+ LEFT     CompressibilityPhasicitySpontaneityPropertiesThrombus Aging      +---------+---------------+---------+-----------+----------+-------------------+ CFV      Full           Yes      Yes                                      +---------+---------------+---------+-----------+----------+-------------------+  SFJ      Full                                                             +---------+---------------+---------+-----------+----------+-------------------+ FV Prox  Full           Yes      Yes                                      +---------+---------------+---------+-----------+----------+-------------------+ FV Mid   Full           Yes      Yes                                      +---------+---------------+---------+-----------+----------+-------------------+ FV DistalFull           Yes      Yes                                      +---------+---------------+---------+-----------+----------+-------------------+  PFV      Full                                                             +---------+---------------+---------+-----------+----------+-------------------+ POP      Full           Yes      Yes                                      +---------+---------------+---------+-----------+----------+-------------------+ PTV                                                   Not well visualized +---------+---------------+---------+-----------+----------+-------------------+ PERO     Full                                                             +---------+---------------+---------+-----------+----------+-------------------+    Summary: BILATERAL: - No evidence of deep vein thrombosis seen in the lower extremities, bilaterally. -No evidence of popliteal cyst, bilaterally.   *See table(s) above for measurements and observations.    Preliminary    Recent Labs    02/06/24 1526 02/07/24 0735  WBC 7.4 7.4  HGB 8.5* 8.3*  HCT 29.7* 28.8*  PLT 432* 454*   Recent Labs    02/06/24 0639 02/06/24 1526 02/07/24 0735  NA 139  --  142  K 3.6  --  3.4*  CL 98  --  97*  CO2 33*  --  36*  GLUCOSE 125*  --  104*  BUN <5*  --  <5*  CREATININE 0.88 0.96 0.90  CALCIUM  8.3*  --  8.8*    Intake/Output Summary (Last 24 hours) at 02/07/2024 1717 Last data filed at  02/07/2024 1400 Gross per 24 hour  Intake 936 ml  Output --  Net 936 ml        Physical Exam: Vital Signs Blood pressure (!) 105/55, pulse 87, temperature 97.6 F (36.4 C), resp. rate (!) 22, height 5' 10 (1.778 m), last menstrual period 05/17/2016, SpO2 98%.   General: awake, alert, sitting up in bed; with 4L O2 in place; nurse in room; NAD HENT: conjugate gaze; oropharynx moist CV: regular rate and rhythm; no JVD Pulmonary:  somewhat coarse throughout- decreased at bases B/L GI: same firmness NT, ND, (+)BS- protuberant Psychiatric: somewhat anxious currently Neurological: Ox3 but delayed responses    Musculoskeletal:     Cervical back: Tenderness present.     Comments: Trace LE edema to ankles- pt kept repeating that has a lot of fluid- but don't see much fluid Ue's5-/5 in Deltoids, biceps, triceps; WE 4/5- give way weakness; Grip 4+/5 and FA 4+/5 B/L LE's- 5-/5 throughout B/L    Skin:    General: Skin is warm and dry.     Comments: IV R forearm- looks OK    Neurological:     Comments: Patient is alert  sitting up in bed.  Oriented x 3 and follows commands. Very vague- kept asking husbands questions about meds- and asking if he knew anything about MS Contin , Celexa , etc- hard to gauge if anxiety, or decreased cognition Intact to light touch in all 4 extremities and fac Assessment/Plan: 1. Functional deficits which require 3+ hours per day of interdisciplinary therapy in a comprehensive inpatient rehab setting. Physiatrist is providing close team supervision and 24 hour management of active medical problems listed below. Physiatrist and rehab team continue to assess barriers to discharge/monitor patient progress toward functional and medical goals  Care Tool:  Bathing    Body parts bathed by patient: Left arm, Right arm, Abdomen, Chest, Face   Body parts bathed by helper: Front perineal area, Buttocks, Right upper leg, Left upper leg, Right lower leg, Left lower  leg     Bathing assist Assist Level: Maximal Assistance - Patient 24 - 49%     Upper Body Dressing/Undressing Upper body dressing   What is the patient wearing?: Pull over shirt    Upper body assist Assist Level: Set up assist    Lower Body Dressing/Undressing Lower body dressing      What is the patient wearing?: Incontinence brief, Pants     Lower body assist Assist for lower body dressing: Total Assistance - Patient < 25%     Toileting Toileting    Toileting assist Assist for toileting: Total Assistance - Patient < 25%     Transfers Chair/bed transfer  Transfers assist     Chair/bed transfer assist level: Moderate Assistance - Patient 50 - 74%     Locomotion Ambulation   Ambulation assist      Assist level: Moderate Assistance - Patient 50 - 74% Assistive device: Walker-rolling Max distance: x3'   Walk 10 feet activity   Assist  Walk 10 feet activity did not occur: Safety/medical concerns (respiratory distress/ generalized weakness)        Walk 50 feet activity   Assist Walk 50 feet with 2 turns activity did not occur: Safety/medical concerns (respiratory distress/ generalized weakness)         Walk 150 feet activity   Assist Walk 150 feet activity did not occur: Safety/medical concerns (respiratory distress/ generalized weakness)         Walk 10 feet on uneven surface  activity   Assist Walk 10 feet on uneven surfaces activity did not occur: Safety/medical concerns (respiratory distress/ generalized weakness)         Wheelchair     Assist Is the patient using a wheelchair?: Yes Type of Wheelchair: Manual Wheelchair activity did not occur: Safety/medical concerns (respiratory distress/ generalized weakness)         Wheelchair 50 feet with 2 turns activity    Assist    Wheelchair 50 feet with 2 turns activity did not occur: Safety/medical concerns (respiratory distress/ generalized weakness)        Wheelchair 150 feet activity     Assist  Wheelchair 150 feet activity did not occur: Safety/medical concerns (respiratory distress/ generalized weakness)       Blood pressure (!) 105/55, pulse 87, temperature 97.6 F (36.4 C), resp. rate (!) 22, height 5' 10 (1.778 m), last menstrual period 05/17/2016, SpO2 98%.  Medical Problem List and Plan: 1. Functional deficits secondary to compression and multiple fractures of spine without SCI-intractable acute on chronic low back pain due to osteoporotic fracture involving L1, L3 and L4/acute left pubic symphysis and right sacral ala fracture.  Patient declined kyphoplasty.  Conservative management with narcotics and TLSO             -patient may  shower             -ELOS/Goals: 12-14 days- supervision to min A             Admit to CIR  First day of evaluations- Con't CIR PT and OT  IPOC to be done today 2.  Antithrombotics: -DVT/anticoagulation:  Pharmaceutical: Lovenox .  Check vascular study 6/13- Dopplers (-)             -antiplatelet therapy: N/A 3. Pain Management/chronic pain followed at [facility] pain clinic- usually takes 10 mg 6x/day: Flexeril  5 mg 3 times daily, lidoderm  patch, oxycodone  30 mg every 4 hours as needed upon transfer- attempting, with d/w pt and husband, to add MS Contin  15 mg BID and reduce Oxycodone  to 20 mg q4 hours  6/13- changed to Oxycontin  10 mg BID and continue Oxy 20 mg q4 hours prn 4. Mood/Behavior/Sleep:  Pt has severe anxiety Cymbalta  60 mg daily, BuSpar  15 mg 3 times daily, Atarax  25 mg 3 times daily as needed             -will add Celexa  10 mg daily for anxiety- is better for anxiety than Duloxetine , but need Duloxetine  for pain control             -antipsychotic agents: N/A 5. Neuropsych/cognition: This patient is ?capable of making decisions on her own behalf. 6. Skin/Wound Care: Routine skin checks 7. Fluids/Electrolytes/Nutrition: Routine in and outs with follow-up chemistries 8.   Sarcoidosis/interstitial lung disease/chronic respiratory failure with hypoxia- Was on 4-5L at home- been doing 3-4L here- and concern for hypercarbia.  Continue chronic oxygen  therapy.  Prednisone  as directed, Lasix  20 mg Monday Wednesday Fridays.  Followed by Dr. Jolyne Needs pulmonary services [facility] 9.  Left femoral head avascular necrosis.Probably has bilaterally, based on chart review  Not a surgical candidate.  Conservative care  6/13- went over with therapy- that her goals from my point of view is to walk 15-25 ft, but probably not more- will not make AVN worse, but will hurt more-  10.  E. coli UTI as well as chronic urinary incontinence.  Status post 3-day course of IV Rocephin  completed.  Continue Flomax  0.4 mg daily.  Check PVR  6/13- is voiding- don't see PVRs 11.  Normocytic anemia.  Borderline MCV of 96 and RDW of 16.  B12 and folate within normal limits.  Check CBC  6/13- CBC stable at 8.3 12.  Addison's disease.  Continue hydrocortisone  chronically.  Followed by endocrinology services Dr.Gebreselassie Nida 13.  Chronic hypotension.  ProAmatine  5 mg twice daily 14.  Class III morbid obesity.  BMI 39.75.  Dietary follow-up 15.  Hyperlipidemia.  Crestor  16.  Hypothyroidism.  Synthroid  17.  Chronic constipation.  Linzess  72 mcg daily, Senokot S2 tablets bedtime, Chronulac  as needed  6/13- LBM 2 days ago- she says doesn't go every day 18.  GERD.  Protonix  19. Noncompliance- Per nursing, pt refusing to use TLSO for [facility] transfers, of note- will d/w pt that need to follow recs, or there's more risk of additional fractures. 20. Hypokalemia  6/13- will replete 40 mEq and then recheck Monday  I spent a total of  52  minutes on total care today- >50% coordination of care- due to  D/w PA as well as nursing multiple times today about not wearing to wear TLSO, wanting to change  pain meds and anxiety- as well as with therapy about her goals overall- Also reviewed labs, vitals and B/B since foley  removed   LOS: 1 days A FACE TO FACE EVALUATION WAS PERFORMED  Zeina Akkerman 02/07/2024, 5:17 PM

## 2024-02-07 NOTE — IPOC Note (Signed)
 Overall Plan of Care [facility]) Patient Details Name: Kathleen [name] MRN: [id number] DOB: Mar 29, 1961  Admitting Diagnosis: Spinal compression fracture [facility])  Hospital Problems: Principal Problem:   Spinal compression fracture (HCC) Active Problems:   Intractable back pain     Functional Problem List: Nursing Pain, Bladder, Bowel, Safety, Endurance, Medication Management  PT Endurance, Behavior, Balance, Motor, Pain, Safety  OT Balance, Edema, Endurance, Motor, Safety, Sensory  SLP    TR         Basic ADL's: OT Bathing, Dressing, Toileting     Advanced  ADL's: OT       Transfers: PT Bed Mobility, Bed to Chair, Car, Occupational psychologist, Research scientist (life sciences): PT Ambulation, Psychologist, prison and probation services, Stairs     Additional Impairments: OT None  SLP        TR      Anticipated Outcomes Item Anticipated Outcome  Self Feeding n/a  Swallowing      Basic self-care  supervision  Toileting  mod A   Bathroom Transfers supervision  Bowel/Bladder  manage bowel and bladder w mod I assist  Transfers  pt will perform all bed mobility with supervision with exception of car transfers min assist  Locomotion  Pt will ambulate with LRAD supervision   Communication     Cognition     Pain  Pain < 4 with prns  Safety/Judgment  manage safety w cues   Therapy Plan: PT Frequency: 5 out of 7 days PT Duration Estimated Length of Stay: 14 days OT Intensity: Minimum of 1-2 x/day, 45 to 90 minutes OT Frequency: 5 out of 7 days OT Duration/Estimated Length of Stay: ~ 14 days     Team Interventions: Nursing Interventions Patient/Family Education, Pain Management, Medication Management, Bladder Management, Bowel Management, Disease Management/Prevention, Discharge Planning, Psychosocial Support  PT interventions Ambulation/gait training, Balance/vestibular training, Cognitive remediation/compensation, DME/adaptive equipment instruction, Discharge planning, Functional mobility  training, Disease management/prevention, Neuromuscular re-education, Pain management, Patient/family education, Community reintegration, Psychosocial support, Museum/gallery curator, UE/LE Strength taining/ROM, Therapeutic Exercise, Therapeutic Activities, Wheelchair propulsion/positioning  OT Interventions Warden/ranger, Disease mangement/prevention, Neuromuscular re-education, Self Care/advanced ADL retraining, Therapeutic Exercise, Wheelchair propulsion/positioning, Cognitive remediation/compensation, DME/adaptive equipment instruction, Pain management, Skin care/wound managment, UE/LE Strength taining/ROM, Community reintegration, Functional electrical stimulation, Patient/family education, Splinting/orthotics, UE/LE Coordination activities, Discharge planning, Functional mobility training, Psychosocial support, Therapeutic Activities  SLP Interventions    TR Interventions    SW/CM Interventions Discharge Planning, Psychosocial Support, Patient/Family Education   Barriers to Discharge MD  Medical stability, Home enviroment access/loayout, Lack of/limited family support, Weight, Weight bearing restrictions, Medication compliance, and Behavior  Nursing Decreased caregiver support, Home environment access/layout 1 level  1 ste no rail w spouse; has DME  PT Incontinence, Weight chronicity of LBP, obesity, compliance, pain  OT Decreased caregiver support    SLP      SW Insurance for SNF coverage     Team Discharge Planning: Destination: PT-Home  ,OT- Home , SLP-  Projected Follow-up: PT- Home Health PT, OT-  Home health OT, SLP-  Projected Equipment Needs: PT-TBD , OT- To be determined, SLP-  Equipment Details: PT- , OT-  Patient/family involved in discharge planning: PT- Patient ,  OT-Patient, SLP-   MD ELOS: ~14 days Medical Rehab Prognosis:  Good Assessment: The patient has been admitted for CIR therapies with the diagnosis of multiple spinal fx's and hip fractures. The team will  be addressing functional mobility, strength, stamina, balance, safety, adaptive techniques and equipment, self-care, bowel and  bladder mgt, patient and caregiver education, O2 mgmt. Goals have been set at [facility] to [facility]. Anticipated discharge destination is home with husband.        See Team Conference Notes for weekly updates to the plan of care

## 2024-02-07 NOTE — Progress Notes (Addendum)
 Occupational Therapy Session Note  Patient Details  Name: Kathleen [name] MRN: [id number] Date of Birth: 05/07/1961  Today's Date: 02/07/2024 OT Individual Time: 1100-1203 OT Individual Time Calculation (min): 63 min    Short Term Goals: Week 1:  OT Short Term Goal 1 (Week 1): Pt will perform stand pivot transfer to the toilet/ BSC with RW with min A consistently OT Short Term Goal 2 (Week 1): Pt will perform toileting with max A 2/3 steps OT Short Term Goal 3 (Week 1): Pt will perform bed mobility with min A from flat bed with bed rails OT Short Term Goal 4 (Week 1): Pt will be able to don LB clothing (underwear/ pants ) with mi nA  Skilled Therapeutic Interventions/Progress Updates:  Verbally communicated with OTR about goals and POC prior to session.  Pt greeted supine in bed, pt agreeable to OT intervention but declines wearing back brace stating it holds me back.    Transfers/bed mobility/functional mobility: pt completed supine>sit via log roll method with MIN A to elevate trunk. Pt completed sit>stand from EOB with MODA, pt able to pivot to w/c with MIN A however pt demonstrating poor safety awareness with pt turning all the way around to sit down vs completing a true pivot.   Pt did complete sit>stands in gym on the outside of the // bars with pt able to push up from w/c and then transfer BUEs to bars. Pt completed 2 reps with MODA +2 for safety.    ADLs:  UB dressing:pt was agreeable to don back brace once, donned with total A but then pt reports it is holding her back and requested for it to be doffed.    Transfers: attempted stand pivot to [facility] from w/c with pt initially standing with MODA +2 for safety but then becomes anxious and begins reaching sporadically for items and starts to lean over sink with bil knees bent. Scooped pt back into w/c with +2 total A d/t pt not listening to therapist cues and completing an unsafe maneuver.  Educated pt on stedy transfer for safety.  Pt agreeable to stand to stedy with MODA and be dependently transferred to [facility].  Toileting: pt requires total A +2 for all toileting tasks, continent urine void.    Ended session with pt supine in bed with all needs within reach.   Pt on 4-5L during session with SpO2 > 90% however pt asked multiple times to have O2 increased d/t anxiety.           Therapy Documentation Precautions:  Precautions Precautions: Fall Precaution/Restrictions Comments: TLSO brace (when ambulating) Restrictions Weight Bearing Restrictions Per Provider Order: No  Pain: pt reports 10/10 pain throughout session, pain meds provided and rest breaks provided as well as deep breathing.     Therapy/Group: Individual Therapy  Mollie Anger [facility] 02/07/2024, 12:22 PM

## 2024-02-07 NOTE — Progress Notes (Signed)
 [facility] Individual Statement of Services  Patient Name:  Kathleen [name]  Date:  02/07/2024  Welcome to the [facility].  Our goal is to provide you with an individualized program based on your diagnosis and situation, designed to meet your specific needs.  With this comprehensive rehabilitation program, you will be expected to participate in at least 3 hours of rehabilitation therapies Monday-Friday, with modified therapy programming on the weekends.  Your rehabilitation program will include the following services:  Physical Therapy (PT), Occupational Therapy (OT), 24 hour per day rehabilitation nursing, Therapeutic Recreaction (TR), Neuropsychology, Care Coordinator, Rehabilitation Medicine, Nutrition Services, and Pharmacy Services  Weekly team conferences will be held on Tuesday to discuss your progress.  Your Inpatient Rehabilitation Care Coordinator will talk with you frequently to get your input and to update you on team discussions.  Team conferences with you and your family in attendance may also be held.  Expected length of stay: 14 days  Overall anticipated outcome: CGA-supervision  Depending on your progress and recovery, your program may change. Your Inpatient Rehabilitation Care Coordinator will coordinate services and will keep you informed of any changes. Your Inpatient Rehabilitation Care Coordinator's name and contact numbers are listed  below.  The following services may also be recommended but are not provided by the Inpatient [facility]:   Home Health Rehabiltiation Services Outpatient Rehabilitation Services    Arrangements will be made to provide these services after discharge if needed.  Arrangements include referral to agencies that provide these services.  Your insurance has been verified to be:  [employer] Your primary doctor is:  Training and development officer  Pertinent information will be shared with your doctor  and your insurance company.  Inpatient Rehabilitation Care Coordinator:  Adrianna Albee, [place] [phone] or Justine Oms  Information discussed with and copy given to patient by: Mardell Shade, 02/07/2024, 10:01 AM

## 2024-02-07 NOTE — Evaluation (Signed)
 Occupational Therapy Assessment and Plan  Patient Details  Name: Kathleen [name] MRN: [id number] Date of Birth: [birth year]  OT Diagnosis: abnormal posture, acute pain, and muscle weakness (generalized) Rehab Potential: Rehab Potential (ACUTE ONLY): Good ELOS: ~ 14 days   Today's Date: 02/07/2024 OT Individual Time: 4098-1191 OT Individual Time Calculation (min): 60 min     Hospital Problem: Principal Problem:   Spinal compression fracture (HCC) Active Problems:   Intractable back pain   Past Medical History:  Past Medical History:  Diagnosis Date   Adrenal insufficiency (HCC)    Anemia    Anxiety    Avascular bone necrosis (HCC)    Breast fibrocystic disorder    Chronic pain    Encounter for long-term opiate analgesic use 02/02/2020   Patient has chronic pain with femoral head avascular necrosis.  Is under pain contract   Fibromyalgia    Gastroesophageal reflux disease    Hypothyroidism    Mitral valve prolapse    Orthostatic hypotension    Peripheral neuropathy    PONV (postoperative nausea and vomiting)    Restrictive lung disease    Moderate to severe   Sarcoidosis    Biopsy proven - [facility]   Sarcoidosis    SOB (shortness of breath)    chronic   Past Surgical History:  Past Surgical History:  Procedure Laterality Date   BIOPSY  10/17/2018   Procedure: BIOPSY;  Surgeon: Ruby Corporal, MD;  [facility];  Service: Endoscopy;;  duodenum, antrum, gastric body   BIOPSY  03/03/2021   Procedure: BIOPSY;  Surgeon: Ruby Corporal, MD;  [facility];  Service: Endoscopy;;   BIOPSY  09/11/2023   Procedure: BIOPSY;  Surgeon: Suzette Espy, MD;  [facility];  Service: Endoscopy;;   BREAST LUMPECTOMY Bilateral 01/12/2011   CHOLECYSTECTOMY N/A 04/17/2021   Procedure: LAPAROSCOPIC CHOLECYSTECTOMY;  Surgeon: Alanda Allegra, MD;  Location: [facility];  Service: General;  Laterality: N/A;   COLONOSCOPY WITH PROPOFOL  N/A 01/03/2022   Procedure:  COLONOSCOPY WITH PROPOFOL ;  Surgeon: Ruby Corporal, MD;  [facility];  Service: Endoscopy;  Laterality: N/A;  220   ESOPHAGEAL DILATION N/A 03/03/2021   Procedure: ESOPHAGEAL DILATION;  Surgeon: Ruby Corporal, MD;  [facility];  Service: Endoscopy;  Laterality: N/A;   ESOPHAGOGASTRODUODENOSCOPY (EGD) WITH PROPOFOL  N/A 10/17/2018   Procedure: ESOPHAGOGASTRODUODENOSCOPY (EGD) WITH PROPOFOL ;  Surgeon: Ruby Corporal, MD;  [facility];  Service: Endoscopy;  Laterality: N/A;  2:25   ESOPHAGOGASTRODUODENOSCOPY (EGD) WITH PROPOFOL  N/A 03/03/2021   Procedure: ESOPHAGOGASTRODUODENOSCOPY (EGD) WITH PROPOFOL ;  Surgeon: Ruby Corporal, MD;  [facility];  Service: Endoscopy;  Laterality: N/A;  12:15   ESOPHAGOGASTRODUODENOSCOPY (EGD) WITH PROPOFOL  N/A 09/11/2023   Procedure: ESOPHAGOGASTRODUODENOSCOPY (EGD) WITH PROPOFOL ;  Surgeon: Suzette Espy, MD;  [facility];  Service: Endoscopy;  Laterality: N/A;  2:30 pm, asa 3, LM to see if pt can come earlier   [facility] DILATION N/A 09/11/2023   Procedure: Londa Rival DILATION;  Surgeon: Suzette Espy, MD;  [facility];  Service: Endoscopy;  Laterality: N/A;   ORIF ANKLE FRACTURE Left 03/10/2023   Procedure: OPEN REDUCTION INTERNAL FIXATION (ORIF) ANKLE FRACTURE;  Surgeon: Diedra Fowler, MD;  Location: [facility];  Service: Orthopedics;  Laterality: Left;   POLYPECTOMY  01/03/2022   Procedure: POLYPECTOMY INTESTINAL;  Surgeon: Ruby Corporal, MD;  [facility];  Service: Endoscopy;;   TUBAL  LIGATION      Assessment & Plan Clinical Impression: Patient is a 63 y.o. year old female 63 year old right handed female with history of chronic hypoxic respiratory failure (5 L O2) interstitial lung disease followed by pulmonary services Dr. Jolyne Needs at [facility], sarcoidosis maintained on chronic prednisone , Addison's disease maintained on Cortef  followed by endocrinology services  Dr.Gebreselassie Nida, chronic hypotension, chronic anemia, chronic urinary incontinence, class III obesity BMI 39.75, bilateral hip AVN and not felt to be a surgical candidate, osteoporosis with multiple vertebral fractures on chronic narcotics followed at the [facility] pain clinic and recent admissions for intractable back pain due to compression fractures of L4 (4/12 - 4/16) and then L3 (5/9 - 5/14), readmitted 01/20/2024 with acute L1 fracture discharged to home 01/23/2024 with [place] home health services.  Per chart review patient lives with spouse.  1 level home one-step to entry.  Patient readmitted 01/25/2024 with increasing low back pain to [facility].  CT lumbar spine showed superior endplate fracture of L1 appearing stable with approximately 10 to 15% loss of height no retropulsion.  Stable superior endplate fractures of L3 and L4 and also 25 to 30% loss of height and minimal retropulsion of the posterior superior aspect of the vertebral bodies without significant associated canal stenosis.  CT of the pelvis showed acute left pubic symphyseal and right sacral ala compression fractures/L4 superior endplate fracture as well as avascular necrosis of the left femoral head with mild articular collapse.  Admission chemistries unremarkable except chloride 94 CO2 30 glucose 145 alkaline phosphatase 129, hemoglobin 9.1, urine culture greater than 100,000 E. coli.  Patient was transferred to [facility] due to increasing back pain.  Interventional radiology consulted/Dr. Alvira Josephs and discussed at length kyphoplasty which patient declined..  TLSO back brace as directed.  Hospital course patient did receive a 3-day course of intravenous ceftriaxone  for E. coli UTI 6/1 through 6/3.  She was cleared to begin Lovenox  for DVT prophylaxis.  Followed the distance by critical care medicine for pulmonary sarcoidosis/chronic hypoxic respiratory failure and currently on 4 L of oxygen  nasal cannula.  Therapy  evaluations completed due to patient's decreased functional mobility and intractable back pain was admitted for a comprehensive rehab program.   Pt reports she gets anxious frequently- and only gets Vistaril - although she's on Duloxetine  and Buspar  15 mg TID as well. She reports she forgot about these meds.  But she feels her anxiety is really bad here in hospital- per the therapy notes, it's really impacting her ability to participate in therapy.  When offered Paxil- she initially said she had never taken it, but then said it made her sick to her stomach.    She willing to try Celexa  low dose with Duloxetine  to help prevent her anxiety- she was made aware that it's to prevent not for rescue anxiety help. Patient transferred to CIR on 02/06/2024 .    Patient currently requires max to total A  with basic self-care skills and min to mod A for basic mobility  secondary to muscle weakness and acute pain left LE with decr sensation, decreased cardiorespiratoy endurance, and decreased sitting balance, decreased standing balance, decreased postural control, and decreased balance strategies.  Prior to hospitalization, patient could complete ADL with min.  Patient will benefit from skilled intervention to decrease level of assist with basic self-care skills and increase independence with basic self-care skills prior to discharge home with care partner.  Anticipate patient will require 24 hour supervision and follow up  home health.  OT - End of Session Activity Tolerance: Tolerates 10 - 20 min activity with multiple rests Endurance Deficit: Yes Endurance Deficit Description: Pt with 5 liters of O2 with activity was doing this PTA OT Assessment Rehab Potential (ACUTE ONLY): Good OT Barriers to Discharge: Decreased caregiver support OT Patient demonstrates impairments in the following area(s): Balance;Edema;Endurance;Motor;Safety;Sensory OT Basic ADL's Functional Problem(s): Bathing;Dressing;Toileting OT  Transfers Functional Problem(s): Toilet;Tub/Shower OT Additional Impairment(s): None OT Plan OT Intensity: Minimum of 1-2 x/day, 45 to 90 minutes OT Frequency: 5 out of 7 days OT Duration/Estimated Length of Stay: ~ 14 days OT Treatment/Interventions: Balance/vestibular training;Disease mangement/prevention;Neuromuscular re-education;Self Care/advanced ADL retraining;Therapeutic Exercise;Wheelchair propulsion/positioning;Cognitive remediation/compensation;DME/adaptive equipment instruction;Pain management;Skin care/wound managment;UE/LE Strength taining/ROM;Community reintegration;Functional electrical stimulation;Patient/family education;Splinting/orthotics;UE/LE Coordination activities;Discharge planning;Functional mobility training;Psychosocial support;Therapeutic Activities OT Self Feeding Anticipated Outcome(s): n/a OT Basic Self-Care Anticipated Outcome(s): supervision OT Toileting Anticipated Outcome(s): mod A OT Bathroom Transfers Anticipated Outcome(s): supervision OT Recommendation Recommendations for Other Services: Neuropsych consult Patient destination: Home Follow Up Recommendations: Home health OT Equipment Recommended: To be determined   OT Evaluation Precautions/Restrictions  Precautions Precautions: Fall Precaution/Restrictions Comments: TLSO brace (when ambulating) Restrictions Weight Bearing Restrictions Per Provider Order: No General Chart Reviewed: Yes Family/Caregiver Present: No    Pain Pain Assessment Pain Scale: 0-10 Pain Score: 10-Worst pain ever assisted with positional changes, getting out of bed, encouraging brace use Home Living/Prior Functioning Home Living Family/patient expects to be discharged to:: Private residence Living Arrangements: Spouse/significant other Available Help at Discharge: Family, Available PRN/intermittently Type of Home: House Home Access: Stairs to enter Secretary/administrator of Steps: 1 Entrance Stairs-Rails:  None Home Layout: One level Bathroom Shower/Tub: Tub/shower unit  Lives With: Spouse Vision Baseline Vision/History: 1 Wears glasses Ability to See in Adequate Light: 1 Impaired Patient Visual Report: No change from baseline Vision Assessment?: No apparent visual deficits Perception  Perception: Within Functional Limits Praxis Praxis: WFL Cognition Cognition Overall Cognitive Status: Within Functional Limits for tasks assessed Arousal/Alertness: Awake/alert Orientation Level: Person;Place;Situation Person: Oriented Place: Oriented Situation: Oriented Memory: Appears intact Attention: Focused;Sustained Focused Attention: Appears intact Sustained Attention: Appears intact Awareness: Appears intact Problem Solving: Appears intact Behaviors: Lability Safety/Judgment: Appears intact Brief Interview for Mental Status (BIMS) Repetition of Three Words (First Attempt): 3 Temporal Orientation: Year: Correct Temporal Orientation: Month: Accurate within 5 days Temporal Orientation: Day: Correct Recall: Sock: Yes, no cue required Recall: Blue: Yes, no cue required Recall: Bed: Yes, no cue required BIMS Summary Score: 15 Sensation Sensation Light Touch: Impaired Detail Light Touch Impaired Details: Impaired LLE;Impaired RLE Proprioception: Impaired Detail Proprioception Impaired Details: Impaired LLE Additional Comments: left LE feels numb at all times Coordination Gross Motor Movements are Fluid and Coordinated: No Fine Motor Movements are Fluid and Coordinated: No Motor  Motor Motor: Abnormal postural alignment and control Motor - Skilled Clinical Observations: acute pain in hips and back  Trunk/Postural Assessment  Cervical Assessment Cervical Assessment: Within Functional Limits Thoracic Assessment Thoracic Assessment:  (rounded shoulders) Lumbar Assessment Lumbar Assessment: Exceptions to [facility] (posterior pelvic tilt) Postural Control Postural Control: Deficits  on evaluation Trunk Control: unable to tolerate standing or unsupported sitting for any length of time  Balance Balance Balance Assessed: Yes Static Sitting Balance Static Sitting - Balance Support: Feet supported;Bilateral upper extremity supported Static Sitting - Level of Assistance: 4: Min assist Static Sitting - Comment/# of Minutes: unable to tolerate sitting unsupported due to pain - needs UE support or back support Static Standing Balance Static Standing - Balance Support: During functional activity;Bilateral upper extremity  supported Static Standing - Level of Assistance: 3: Mod assist Static Standing - Comment/# of Minutes: ~ 1 min before needing ti sit Extremity/Trunk Assessment RUE Assessment RUE Assessment: Within Functional Limits LUE Assessment LUE Assessment: Within Functional Limits  Care Tool Care Tool Self Care Eating   Eating Assist Level: Set up assist    Oral Care    Oral Care Assist Level: Set up assist    Bathing   Body parts bathed by patient: Left arm;Right arm;Abdomen;Chest;Face Body parts bathed by helper: Front perineal area;Buttocks;Right upper leg;Left upper leg;Right lower leg;Left lower leg   Assist Level: Maximal Assistance - Patient 24 - 49%    Upper Body Dressing(including orthotics)   What is the patient wearing?: Pull over shirt   Assist Level: Set up assist    Lower Body Dressing (excluding footwear)   What is the patient wearing?: Incontinence brief;Pants Assist for lower body dressing: Total Assistance - Patient < 25%    Putting on/Taking off footwear   What is the patient wearing?: Non-skid slipper socks;Shoes Assist for footwear: Total Assistance - Patient < 25%       Care Tool Toileting Toileting activity   Assist for toileting: Total Assistance - Patient < 25%     Care Tool Bed Mobility Roll left and right activity        Sit to lying activity        Lying to sitting on side of bed activity   Lying to sitting on  side of bed assist level: the ability to move from lying on the back to sitting on the side of the bed with no back support.: Moderate Assistance - Patient 50 - 74%     Care Tool Transfers Sit to stand transfer   Sit to stand assist level: Moderate Assistance - Patient 50 - 74%    Chair/bed transfer   Chair/bed transfer assist level: Moderate Assistance - Patient 50 - 74%     Toilet transfer   Assist Level: Moderate Assistance - Patient 50 - 74%     Care Tool Cognition  Expression of Ideas and Wants Expression of Ideas and Wants: 4. Without difficulty (complex and basic) - expresses complex messages without difficulty and with speech that is clear and easy to understand  Understanding Verbal and Non-Verbal Content Understanding Verbal and Non-Verbal Content: 4. Understands (complex and basic) - clear comprehension without cues or repetitions   Memory/Recall Ability     Refer to Care Plan for Long Term Goals  SHORT TERM GOAL WEEK 1 OT Short Term Goal 1 (Week 1): Pt will perform stand pivot transfer to the toilet/ BSC with RW with min A consistently OT Short Term Goal 2 (Week 1): Pt will perform toileting with max A 2/3 steps OT Short Term Goal 3 (Week 1): Pt will perform bed mobility with min A from flat bed with bed rails OT Short Term Goal 4 (Week 1): Pt will be able to don LB clothing (underwear/ pants ) with mi nA  Recommendations for other services: Neuropsych   Skilled Therapeutic Intervention 1:1 OT eval initiated with OT purpose, role and goals discussed. Pt received in the bed and reported he back really hurts and she didn't sleep well. Pt agreeable to trying to get out of bed. Pt came to EOB with mod A. Pt reports numbness in her left foot frequently (from her back). Pt unable to tolerate sitting EOB for any length of time. Pt required bilateral UE support to maintain a  comfortable position. Pt declined wanting to use the STEDY to transfer. Pt wanted to perform squat pivot to  w/c positioned on her right. Max encouragement to don her brace. Pt performed transfer with mod A. Self care retraining at sink level with focus on being able to use both hands in a task and tolerate out of bed positioning. Pt then needed to go the bathroom and asked to transfer to [facility] - pt declined to don TLSO. Pt transferred with mod A to [facility] and then back to w/c with RW with min A. Pt continues to decline wearing the brace reporting she it doesn't feel right and bothers her. Pt required total A for LB dressing and bathing. Pt agreed to sit up in chair but without brace.  Left with nursing.   ADL ADL Eating: Set up Grooming: Setup Where Assessed-Grooming: Sitting at sink Upper Body Bathing: Minimal assistance Where Assessed-Upper Body Bathing: Sitting at sink Lower Body Bathing: Dependent Where Assessed-Lower Body Bathing: Sitting at sink Upper Body Dressing: Minimal assistance Where Assessed-Upper Body Dressing: Sitting at sink Lower Body Dressing: Dependent Where Assessed-Lower Body Dressing: Sitting at sink Toileting: Dependent Where Assessed-Toileting: Bedside Commode Toilet Transfer: Moderate assistance Mobility  Transfers Sit to Stand: Moderate Assistance - Patient 50-74% Stand to Sit: Moderate Assistance - Patient 50-74%   Discharge Criteria: Patient will be discharged from OT if patient refuses treatment 3 consecutive times without medical reason, if treatment goals not met, if there is a change in medical status, if patient makes no progress towards goals or if patient is discharged from hospital.  The above assessment, treatment plan, treatment alternatives and goals were discussed and mutually agreed upon: by patient  Oran Bimler 02/07/2024, 12:18 PM

## 2024-02-07 NOTE — Plan of Care (Signed)
  Problem: RH Balance Goal: LTG: Patient will maintain dynamic sitting balance (OT) Description: LTG:  Patient will maintain dynamic sitting balance with assistance during activities of daily living (OT) Flowsheets (Taken 02/07/2024 1240) LTG: Pt will maintain dynamic sitting balance during ADLs with: Supervision/Verbal cueing Goal: LTG Patient will maintain dynamic standing with ADLs (OT) Description: LTG:  Patient will maintain dynamic standing balance with assist during activities of daily living (OT)  Flowsheets (Taken 02/07/2024 1240) LTG: Pt will maintain dynamic standing balance during ADLs with: Supervision/Verbal cueing   Problem: Sit to Stand Goal: LTG:  Patient will perform sit to stand in prep for activites of daily living with assistance level (OT) Description: LTG:  Patient will perform sit to stand in prep for activites of daily living with assistance level (OT) Flowsheets (Taken 02/07/2024 1240) LTG: PT will perform sit to stand in prep for activites of daily living with assistance level: Supervision/Verbal cueing   Problem: RH Bathing Goal: LTG Patient will bathe all body parts with assist levels (OT) Description: LTG: Patient will bathe all body parts with assist levels (OT) Flowsheets (Taken 02/07/2024 1240) LTG: Pt will perform bathing with assistance level/cueing: Contact Guard/Touching assist   Problem: RH Dressing Goal: LTG Patient will perform upper body dressing (OT) Description: LTG Patient will perform upper body dressing with assist, with/without cues (OT). Flowsheets (Taken 02/07/2024 1240) LTG: Pt will perform upper body dressing with assistance level of: Supervision/Verbal cueing Goal: LTG Patient will perform lower body dressing w/assist (OT) Description: LTG: Patient will perform lower body dressing with assist, with/without cues in positioning using equipment (OT) Flowsheets (Taken 02/07/2024 1240) LTG: Pt will perform lower body dressing with assistance  level of: Contact Guard/Touching assist   Problem: RH Toileting Goal: LTG Patient will perform toileting task (3/3 steps) with assistance level (OT) Description: LTG: Patient will perform toileting task (3/3 steps) with assistance level (OT)  Flowsheets (Taken 02/07/2024 1240) LTG: Pt will perform toileting task (3/3 steps) with assistance level: Contact Guard/Touching assist   Problem: RH Toilet Transfers Goal: LTG Patient will perform toilet transfers w/assist (OT) Description: LTG: Patient will perform toilet transfers with assist, with/without cues using equipment (OT) Flowsheets (Taken 02/07/2024 1240) LTG: Pt will perform toilet transfers with assistance level of: Supervision/Verbal cueing   Problem: RH Tub/Shower Transfers Goal: LTG Patient will perform tub/shower transfers w/assist (OT) Description: LTG: Patient will perform tub/shower transfers with assist, with/without cues using equipment (OT) Flowsheets (Taken 02/07/2024 1240) LTG: Pt will perform tub/shower stall transfers with assistance level of: Minimal Assistance - Patient > 75%

## 2024-02-07 NOTE — Progress Notes (Signed)
 Inpatient Rehabilitation Care Coordinator Assessment and Plan Patient Details  Name: Kathleen [name] MRN: [id number] Date of Birth: 1961-05-24  Today's Date: 02/07/2024  Hospital Problems: Principal Problem:   Spinal compression fracture [facility]) Active Problems:   Intractable back pain  Past Medical History:  Past Medical History:  Diagnosis Date   Adrenal insufficiency (HCC)    Anemia    Anxiety    Avascular bone necrosis (HCC)    Breast fibrocystic disorder    Chronic pain    Encounter for long-term opiate analgesic use 02/02/2020   Patient has chronic pain with femoral head avascular necrosis.  Is under pain contract   Fibromyalgia    Gastroesophageal reflux disease    Hypothyroidism    Mitral valve prolapse    Orthostatic hypotension    Peripheral neuropathy    PONV (postoperative nausea and vomiting)    Restrictive lung disease    Moderate to severe   Sarcoidosis    Biopsy proven - [facility]   Sarcoidosis    SOB (shortness of breath)    chronic   Past Surgical History:  Past Surgical History:  Procedure Laterality Date   BIOPSY  10/17/2018   Procedure: BIOPSY;  Surgeon: Ruby Corporal, MD;  [facility];  Service: Endoscopy;;  duodenum, antrum, gastric body   BIOPSY  03/03/2021   Procedure: BIOPSY;  Surgeon: Ruby Corporal, MD;  [facility];  Service: Endoscopy;;   BIOPSY  09/11/2023   Procedure: BIOPSY;  Surgeon: Suzette Espy, MD;  [facility];  Service: Endoscopy;;   BREAST LUMPECTOMY Bilateral 01/12/2011   CHOLECYSTECTOMY N/A 04/17/2021   Procedure: LAPAROSCOPIC CHOLECYSTECTOMY;  Surgeon: Alanda Allegra, MD;  Location: [facility];  Service: General;  Laterality: N/A;   COLONOSCOPY WITH PROPOFOL  N/A 01/03/2022   Procedure: COLONOSCOPY WITH PROPOFOL ;  Surgeon: Ruby Corporal, MD;  [facility];  Service: Endoscopy;  Laterality: N/A;  220   ESOPHAGEAL DILATION N/A 03/03/2021   Procedure: ESOPHAGEAL DILATION;  Surgeon:  Ruby Corporal, MD;  [facility];  Service: Endoscopy;  Laterality: N/A;   ESOPHAGOGASTRODUODENOSCOPY (EGD) WITH PROPOFOL  N/A 10/17/2018   Procedure: ESOPHAGOGASTRODUODENOSCOPY (EGD) WITH PROPOFOL ;  Surgeon: Ruby Corporal, MD;  [facility];  Service: Endoscopy;  Laterality: N/A;  2:25   ESOPHAGOGASTRODUODENOSCOPY (EGD) WITH PROPOFOL  N/A 03/03/2021   Procedure: ESOPHAGOGASTRODUODENOSCOPY (EGD) WITH PROPOFOL ;  Surgeon: Ruby Corporal, MD;  [facility];  Service: Endoscopy;  Laterality: N/A;  12:15   ESOPHAGOGASTRODUODENOSCOPY (EGD) WITH PROPOFOL  N/A 09/11/2023   Procedure: ESOPHAGOGASTRODUODENOSCOPY (EGD) WITH PROPOFOL ;  Surgeon: Suzette Espy, MD;  [facility];  Service: Endoscopy;  Laterality: N/A;  2:30 pm, asa 3, LM to see if pt can come earlier   [facility] DILATION N/A 09/11/2023   Procedure: Londa Rival DILATION;  Surgeon: Suzette Espy, MD;  [facility];  Service: Endoscopy;  Laterality: N/A;   ORIF ANKLE FRACTURE Left 03/10/2023   Procedure: OPEN REDUCTION INTERNAL FIXATION (ORIF) ANKLE FRACTURE;  Surgeon: Diedra Fowler, MD;  Location: [facility];  Service: Orthopedics;  Laterality: Left;   POLYPECTOMY  01/03/2022   Procedure: POLYPECTOMY INTESTINAL;  Surgeon: Ruby Corporal, MD;  [facility];  Service: Endoscopy;;   TUBAL LIGATION     Social History:  reports that she has never smoked. She has never been exposed to tobacco smoke. She has never used smokeless tobacco. She reports that she does not drink alcohol and  does not use drugs.  Family / Support Systems Marital Status: Married Patient Roles: Spouse, Parent, Other (Comment) (sibling, friend) Spouse/Significant Other: Eddie Good [phone] Children: Ashely-daughter [phone] Other Supports: Justine-mom [phone]  Sister works in [employer] here Anticipated Caregiver: husband and daughter Ability/Limitations of Caregiver: husband has bad back but has been helping  pt at home. Daughter works but also helps when she can Caregiver Availability: 24/7 Family Dynamics: Close knit family who have pulled together to assist pt and make sure her needs are met. She has been to a SNF and has no plans to go back.  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 issues Guardian/Conservator: None-according to MD pt is not fully capable of making her own decisions, will look toward her husband to make any decisions while here   Abuse/Neglect Abuse/Neglect Assessment Can Be Completed: Yes Physical Abuse: Denies Verbal Abuse: Denies Sexual Abuse: Denies Exploitation of patient/patient's resources: Denies Self-Neglect: Denies  Patient response to: Social Isolation - How often do you feel lonely or isolated from those around you?: Never  Emotional Status Pt's affect, behavior and adjustment status: Pt is motivated to do well but has much anxiety and this seems to limit her. Will ask neuro-psych to see and work on getting her as independent as can be here. She did need assist from husband prior to admission here. Recent Psychosocial Issues: other health issues-home 02, OA, etc Psychiatric History: Hx of depression/anxiety takes medications for this and will ask neuro-psych to see while here Substance Abuse History: NA  Patient / Family Perceptions, Expectations & Goals Pt/Family understanding of illness & functional limitations: Pt is able to explain her issues and bone issues. She wants to get stronger and gain strength while here. She does talk with the MD who rounded and feels understands her plan moving forward. Premorbid pt/family roles/activities: wife, mom,  daughter, sibling, friend, etc Anticipated changes in roles/activities/participation: resume Pt/family expectations/goals: Pt states:  I hope I can get stronger and do more for myself.   My husband has a bad back.  Community Resources [employer]: Other (Comment) (Been to [facility]) Premorbid Home Care/DME Agencies: Other (Comment) (rw, scooter, tub seat, bsc) Transportation available at discharge: husband and daughter Is the patient able to respond to transportation needs?: Yes In the past 12 months, has lack of transportation kept you from medical appointments or from getting medications?: No In the past 12 months, has lack of transportation kept you from meetings, work, or from getting things needed for daily living?: No Resource referrals recommended: Neuropsychology  Discharge Planning Living Arrangements: Spouse/significant other Support Systems: Spouse/significant other, Children, Other relatives, Friends/neighbors Type of Residence: Private residence Insurance Resources: Media planner (specify) ([employer]) Financial Resources: SSD, Family Support Financial Screen Referred: No Living Expenses: Psychologist, sport and exercise Management: Patient, Spouse Does the patient have any problems obtaining your medications?: No Home Management: husband Patient/Family Preliminary Plans: Return home with husband who has been assisting with her care and will continue but he has a bad back and disabled as a result of this. Being evaluated today and goals being set for stay here. Aware team conference Tuesday and will update then Care Coordinator Barriers to Discharge: Insurance for SNF coverage Care Coordinator Anticipated Follow Up Needs: HH/OP  Clinical  Impression Pleasant female who is anxious regarding being here on rehab and the meeting expectation. Have placed on neuro-psych list and will provide support. Will update once team's evaluations complete  Mardell Shade 02/07/2024,  9:59 AM

## 2024-02-07 NOTE — Progress Notes (Signed)
 Inpatient Rehabilitation  Patient information reviewed and entered into eRehab system by [facility]. Karen Kays., CCC/SLP, PPS Coordinator.  Information including medical coding, functional ability and quality indicators will be reviewed and updated through discharge.

## 2024-02-07 NOTE — Progress Notes (Signed)
 Patient requesting continuous pulse oximeter, order received from [facility]. Patient very anxious with her oxygen  level and wants to stay on 4-5L via nasal cannula. Non compliant with TLSO brace and gait belt when getting OOB to BSC. Educated on safety precautions.

## 2024-02-07 NOTE — Progress Notes (Signed)
 BLE venous duplex has been completed.   Results can be found under chart review under CV PROC. 02/07/2024 5:06 PM Navdeep Halt RVT, RDMS

## 2024-02-08 DIAGNOSIS — K5901 Slow transit constipation: Secondary | ICD-10-CM | POA: Diagnosis not present

## 2024-02-08 DIAGNOSIS — S129XXD Fracture of neck, unspecified, subsequent encounter: Secondary | ICD-10-CM

## 2024-02-08 DIAGNOSIS — J849 Interstitial pulmonary disease, unspecified: Secondary | ICD-10-CM | POA: Diagnosis not present

## 2024-02-08 DIAGNOSIS — D869 Sarcoidosis, unspecified: Secondary | ICD-10-CM

## 2024-02-08 MED ORDER — ALENDRONATE SODIUM 70 MG PO TABS
70.0000 mg | ORAL_TABLET | ORAL | Status: DC
  Administered 2024-02-16: 70 mg via ORAL
  Filled 2024-02-08 (×3): qty 1

## 2024-02-08 MED ORDER — ALENDRONATE SODIUM 10 MG PO TABS: 70.0000 mg | ORAL_TABLET | Freq: Every day | ORAL | Status: DC

## 2024-02-08 NOTE — Progress Notes (Signed)
 Physical Therapy Session Note  Patient Details  Name: Kathleen [name] MRN: [id number] Date of Birth: 12-May-1961  Today's Date: 02/08/2024 PT Individual Time: 4098-1191 + 1415-1455 PT Individual Time Calculation (min): 70 min + 40 min  Short Term Goals: Week 1:  PT Short Term Goal 1 (Week 1): Pt will complete rolling in bed with CGA PT Short Term Goal 2 (Week 1): Pt will complete side>sit with CGA. PT Short Term Goal 3 (Week 1): Pt will complete sit to stand with CGA + LRAD PT Short Term Goal 4 (Week 1): Pt will maintain dynamic standing balance with minA + LRAD  Skilled Therapeutic Interventions/Progress Updates:     Session 1: Chart reviewed and pt agreeable to therapy. Pt received semi-reclined in bed with 10/10 c/o pain in back and 4L O2 via [place]. Also of note, RN present for administering medication. RN requested assistance with weighing patient. Session focused on functional transfers and balance to promote safe home access. Pt initiated session with transfer to EOB using modA + 2 for log roll and side>sit, with pt highly limited 2/2 pain. Pt required maxA to donn TLSO, but pt compliant. Pt then completed sit to stand on scale using minA. After weighing, pt c/o high pain and need for toileting. Pt transferred to toilet using STEDY + minA +2 2/2 increased nausea and instability with pain. Pt noted constipation to RN. Pt then completed return to EOB using same assist with STEDY.  Pt repeated sit to stand with minA +STEDY and maintained standing balance with modA + 2 hands on STEDY. Pt then returned to bed using minA to help BLE and maxA to scoot upward in bed. Pt VS checked and BP at 125/53 mmHg. Pt continued to c/o high nausea, but reported some relief with rest in bed. Pt completed bed exercises of ankle pumps, quad sets, and heel slides. Session education emphasized importance of TLSO and therapy goals. At end of session, pt was left semi-reclined in bed with alarm engaged, nurse call bell and  all needs in reach.  Session 2: Chart reviewed and pt agreeable to therapy. Pt received semi-reclined in bed with 10/10 c/o pain in back and on 4L O2 via [place]. Session focused on functional transfers and standing tolerance to promote OOB tolerance. Pt initiated session with transfer to EOB using CGA + elevated headrest. Pt then completed multiple sit to stands using minA + RW and progressing to CGA + RW. Pt noted to tolerate 10-20 secs standing before needing to rest. Pt also had 1 bout of anxiety where pt expressed fear of falling. Pt provided modA to return to side lying in safe position on bed. Pt verbally coached on observing surroundings to confirm that she was not falling. Pt denies dizziness. Pt able to return to EOB sitting and continue standing practice with same assist. Pt then returned to bed with minA for BLE management and maxA for scooting in bed. Pt then completed series of SAQ, ankle pumps, and heel slides with pt educated on repeating exercises twice daily between sessions. At end of session, pt was left semi-reclined in bed with alarm engaged, nurse call bell and all needs in reach.     Therapy Documentation Precautions:  Precautions Precautions: Fall Recall of Precautions/Restrictions: Intact Precaution/Restrictions Comments: TLSO brace (when ambulating), pt refused to don at evaluation Required Braces or Orthoses: Spinal Brace Spinal Brace: Thoracolumbosacral orthotic Restrictions Weight Bearing Restrictions Per Provider Order: Yes General:      Therapy/Group: Individual Therapy  Hercules Hasler G Megean Fabio 02/08/2024, 1:46 PM

## 2024-02-08 NOTE — Plan of Care (Signed)
  Problem: SCI BLADDER ELIMINATION Goal: RH STG MANAGE BLADDER WITH ASSISTANCE Description: STG Manage Bladder With Assistance Outcome: Progressing Goal: RH STG MANAGE BLADDER WITH MEDICATION WITH ASSISTANCE Description: STG Manage Bladder With Medication With mod I Assistance. Outcome: Progressing Goal: RH STG SCI MANAGE BLADDER PROGRAM W/ASSISTANCE Description: Toileting protocol Outcome: Progressing   Problem: RH SAFETY Goal: RH STG ADHERE TO SAFETY PRECAUTIONS W/ASSISTANCE/DEVICE Description: STG Adhere to Safety Precautions With cues Assistance/Device. Outcome: Progressing   Problem: RH KNOWLEDGE DEFICIT SCI Goal: RH STG INCREASE KNOWLEDGE OF SELF CARE AFTER SCI Description: Patient and spouse will be able to manage care at discharge using educational resources for medication, skin care, transfers, toileting, dietary modification independently Outcome: Progressing

## 2024-02-08 NOTE — Progress Notes (Signed)
 Occupational Therapy Session Note  Patient Details  Name: Kathleen [name] MRN: [id number] Date of Birth: 1960-10-01  Today's Date: 02/08/2024 OT Individual Time: 4098-1191 OT Individual Time Calculation (min): 72 min    Short Term Goals: Week 1:  OT Short Term Goal 1 (Week 1): Pt will perform stand pivot transfer to the toilet/ BSC with RW with min A consistently OT Short Term Goal 2 (Week 1): Pt will perform toileting with max A 2/3 steps OT Short Term Goal 3 (Week 1): Pt will perform bed mobility with min A from flat bed with bed rails OT Short Term Goal 4 (Week 1): Pt will be able to don LB clothing (underwear/ pants ) with mi nA  Skilled Therapeutic Interventions/Progress Updates:  Skilled OT intervention completed with focus on activity/sitting/brace tolerance, functional sit > stands, ADL retraining. Pt received semi supine in bed. Severe pain reported in lumbar spine with mobility; pre-medicated however nurse notified of additional pain and anxiety med request. OT offered rest breaks, repositioning, distraction and calming strategies throughout for pain/anxiety reduction.  Pt was very apprehensive about mobility. Reported she feels increased anxiety with anticipating the level of pain that comes with movement, and also panics when breathing becomes more difficult upon exertion which is exacerbated by the tightness of the TLSO. Utilized therapeutic use of self and active listening to build rapport with pt.   Transitioned via log roll and heavy reliance on bed features, semi supine > sitting EOB with min A for trunk elevation. Pt with minimal tolerance sitting EOB with back unsupported. Agreeable to donn TLSO for spinal support; required total A. Due to pain/anxiety level, utilized stedy for safety. Stood light min A from slightly elevated in stedy, dependent transfer > w/c. Stood from perched position with CGA and CGA to lower to w/c. Pt SPO2 did desat to 77% and HR elevated to 123 bpm  with activity, however ancipitate pt was holding breath and also very anxious during these changes in vitals. Quickly returned to SPO2 96% with pursed lip breathing.   With emphasis on sitting tolerance and wear of TLSO, pt engaged in hair grooming at sink with fatigue demonstrated after prolonged use of BUE over head. Education provided to pt on purpose of TLSO, the mechanisms of it, oxygen  level and CO2 retention, encouraged sitting OOB for lung support and activity tolerance. Pt also engaged in conversation about life before her medical complications. Washed face with set up A. Required supervision for oral care due to inability to lean forward and spit adequately with TLSO on.   Attempted 1 sit > stand from w/c position however unable to stand due to pain/fatigue. Required mod A +2 in stedy, dependent transfer > EOB, CGA sit > stand from perched position. Doffed TLSO with total A. Required +2 for sit > supine transition to minimize pain. +2 to boost > HOB. Direct care handoff to nursing due to OT time constraint.   Vitals -Received on 4.5L via [place], SPO2 96% -77% after activity but rebounded to 96% with pursed lip breathing (Unable to titrate pt down due to pt anxiety; chronic O2 user with reported 5 L use at home)   Therapy Documentation Precautions:  Precautions Precautions: Fall Recall of Precautions/Restrictions: Intact Precaution/Restrictions Comments: TLSO brace (when ambulating), pt refused to don at evaluation Required Braces or Orthoses: Spinal Brace Spinal Brace: Thoracolumbosacral orthotic Restrictions Weight Bearing Restrictions Per Provider Order: No    Therapy/Group: Individual Therapy  Ruthanna Covert, MS, OTR/L  02/08/2024, 12:24  PM

## 2024-02-08 NOTE — Progress Notes (Addendum)
 PROGRESS NOTE   Subjective/Complaints: Reports wheezing, sob at times, low and ? Cervical back pain. Was able to sleep last night. Nurse reported chest discomfort associated with wheezing.   ROS: Patient denies fever, rash, sore throat, blurred vision, dizziness, nausea, vomiting, diarrhea,    headache, or mood change.    Objective:   VAS US  LOWER EXTREMITY VENOUS (DVT) Result Date: 02/07/2024  Lower Venous DVT Study Patient Name:  Kathleen LYTER  Date of Exam:   02/07/2024 Medical Rec #: [id number]       Accession #:    [id number] Date of Birth: 1961/02/13       Patient Gender: F Patient Age:   63 years Exam Location:  [facility] Procedure:      VAS US  LOWER EXTREMITY VENOUS (DVT) Referring Phys: Georjean Kite --------------------------------------------------------------------------------  Indications: Pain, and Swelling. Other Indications: Rehab patient. Limitations: Body habitus, poor ultrasound/tissue interface and pain intolerance. Comparison Study: Previous exam on 02/26/2023 was negative for DVT. Performing Technologist: Arlyce Berger RVT, RDMS  Examination Guidelines: A complete evaluation includes B-mode imaging, spectral Doppler, color Doppler, and power Doppler as needed of all accessible portions of each vessel. Bilateral testing is considered an integral part of a complete examination. Limited examinations for reoccurring indications may be performed as noted. The reflux portion of the exam is performed with the patient in reverse Trendelenburg.  +---------+---------------+---------+-----------+----------+-------------------+ RIGHT    CompressibilityPhasicitySpontaneityPropertiesThrombus Aging      +---------+---------------+---------+-----------+----------+-------------------+ CFV      Full           Yes      Yes                                       +---------+---------------+---------+-----------+----------+-------------------+ SFJ      Full                                                             +---------+---------------+---------+-----------+----------+-------------------+ FV Prox  Full           Yes      Yes                                      +---------+---------------+---------+-----------+----------+-------------------+ FV Mid   Full           Yes      Yes                                      +---------+---------------+---------+-----------+----------+-------------------+ FV DistalFull           Yes      Yes                                      +---------+---------------+---------+-----------+----------+-------------------+  PFV                     Yes      Yes                                      +---------+---------------+---------+-----------+----------+-------------------+ POP      Full           Yes      Yes                                      +---------+---------------+---------+-----------+----------+-------------------+ PTV      Full                                                             +---------+---------------+---------+-----------+----------+-------------------+ PERO                                                  Not well visualized +---------+---------------+---------+-----------+----------+-------------------+   +---------+---------------+---------+-----------+----------+-------------------+ LEFT     CompressibilityPhasicitySpontaneityPropertiesThrombus Aging      +---------+---------------+---------+-----------+----------+-------------------+ CFV      Full           Yes      Yes                                      +---------+---------------+---------+-----------+----------+-------------------+ SFJ      Full                                                             +---------+---------------+---------+-----------+----------+-------------------+ FV  Prox  Full           Yes      Yes                                      +---------+---------------+---------+-----------+----------+-------------------+ FV Mid   Full           Yes      Yes                                      +---------+---------------+---------+-----------+----------+-------------------+ FV DistalFull           Yes      Yes                                      +---------+---------------+---------+-----------+----------+-------------------+ PFV      Full                                                             +---------+---------------+---------+-----------+----------+-------------------+  POP      Full           Yes      Yes                                      +---------+---------------+---------+-----------+----------+-------------------+ PTV                                                   Not well visualized +---------+---------------+---------+-----------+----------+-------------------+ PERO     Full                                                             +---------+---------------+---------+-----------+----------+-------------------+    Summary: BILATERAL: - No evidence of deep vein thrombosis seen in the lower extremities, bilaterally. -No evidence of popliteal cyst, bilaterally.   *See table(s) above for measurements and observations.    Preliminary    Recent Labs    02/06/24 1526 02/07/24 0735  WBC 7.4 7.4  HGB 8.5* 8.3*  HCT 29.7* 28.8*  PLT 432* 454*   Recent Labs    02/06/24 0639 02/06/24 1526 02/07/24 0735  NA 139  --  142  K 3.6  --  3.4*  CL 98  --  97*  CO2 33*  --  36*  GLUCOSE 125*  --  104*  BUN <5*  --  <5*  CREATININE 0.88 0.96 0.90  CALCIUM  8.3*  --  8.8*    Intake/Output Summary (Last 24 hours) at 02/08/2024 1043 Last data filed at 02/08/2024 1027 Gross per 24 hour  Intake 876 ml  Output 250 ml  Net 626 ml        Physical Exam: Vital Signs Blood pressure 115/79, pulse 96, temperature  98.1 F (36.7 C), resp. rate 17, height 5' 10 (1.778 m), weight 124.9 kg, last menstrual period 05/17/2016, SpO2 98%.   Constitutional: No distress . Vital signs reviewed. obese HEENT: NCAT, EOMI, oral membranes moist Neck: supple Cardiovascular: RRR without murmur. No JVD    Respiratory/Chest: CTA Bilaterally with exp wheezes and rhonchi. O2 [place]    GI/Abdomen: BS +, non-tender, non-distended Ext: no clubbing, cyanosis, or edema Psych: pleasant and cooperative  Musculoskeletal:     Cervical and low back: Tenderness present.     Comments: Trace LE edema to ankles-  Ue's5-/5 in Deltoids, biceps, triceps; WE 4/5- give way weakness; Grip 4+/5 and FA 4+/5 B/L LE's- 5-/5 throughout B/L    Skin:    General: Skin is warm and dry.     Comments: IV R forearm- looks OK    Neurological:     Comments: Patient is fairly alert.   Oriented x 3 and follows commands.  Moves all 4 limbs. CN exam non-focal. Fair insight Intact to light touch in all 4 extremities    Assessment/Plan: 1. Functional deficits which require 3+ hours per day of interdisciplinary therapy in a comprehensive inpatient rehab setting. Physiatrist is providing close team supervision and 24 hour management of active medical problems listed below. Physiatrist and rehab team continue to assess barriers to discharge/monitor patient progress toward functional  and medical goals  Care Tool:  Bathing    Body parts bathed by patient: Left arm, Right arm, Abdomen, Chest, Face   Body parts bathed by helper: Front perineal area, Buttocks, Right upper leg, Left upper leg, Right lower leg, Left lower leg     Bathing assist Assist Level: Maximal Assistance - Patient 24 - 49%     Upper Body Dressing/Undressing Upper body dressing   What is the patient wearing?: Pull over shirt    Upper body assist Assist Level: Set up assist    Lower Body Dressing/Undressing Lower body dressing      What is the patient wearing?: Incontinence  brief, Pants     Lower body assist Assist for lower body dressing: Total Assistance - Patient < 25%     Toileting Toileting    Toileting assist Assist for toileting: Total Assistance - Patient < 25%     Transfers Chair/bed transfer  Transfers assist     Chair/bed transfer assist level: Moderate Assistance - Patient 50 - 74%     Locomotion Ambulation   Ambulation assist      Assist level: Moderate Assistance - Patient 50 - 74% Assistive device: Walker-rolling Max distance: x3'   Walk 10 feet activity   Assist  Walk 10 feet activity did not occur: Safety/medical concerns (respiratory distress/ generalized weakness)        Walk 50 feet activity   Assist Walk 50 feet with 2 turns activity did not occur: Safety/medical concerns (respiratory distress/ generalized weakness)         Walk 150 feet activity   Assist Walk 150 feet activity did not occur: Safety/medical concerns (respiratory distress/ generalized weakness)         Walk 10 feet on uneven surface  activity   Assist Walk 10 feet on uneven surfaces activity did not occur: Safety/medical concerns (respiratory distress/ generalized weakness)         Wheelchair     Assist Is the patient using a wheelchair?: Yes Type of Wheelchair: Manual Wheelchair activity did not occur: Safety/medical concerns (respiratory distress/ generalized weakness)         Wheelchair 50 feet with 2 turns activity    Assist    Wheelchair 50 feet with 2 turns activity did not occur: Safety/medical concerns (respiratory distress/ generalized weakness)       Wheelchair 150 feet activity     Assist  Wheelchair 150 feet activity did not occur: Safety/medical concerns (respiratory distress/ generalized weakness)       Blood pressure 115/79, pulse 96, temperature 98.1 F (36.7 C), resp. rate 17, height 5' 10 (1.778 m), weight 124.9 kg, last menstrual period 05/17/2016, SpO2 98%.  Medical  Problem List and Plan: 1. Functional deficits secondary to compression and multiple fractures of spine without SCI-intractable acute on chronic low back pain due to osteoporotic fracture involving L1, L3 and L4/acute left pubic symphysis and right sacral ala fracture.  Patient declined kyphoplasty.  Conservative management with narcotics and TLSO             -patient may  shower             -ELOS/Goals: 12-14 days- supervision to min A            -Continue CIR therapies including PT, OT  2.  Antithrombotics: -DVT/anticoagulation:  Pharmaceutical: Lovenox .  Check vascular study 6/13- Dopplers (-)             -antiplatelet therapy: N/A 3. Pain Management/chronic pain  followed at [facility]- usually takes 10 mg 6x/day: Flexeril  5 mg 3 times daily, lidoderm  patch, oxycodone  30 mg every 4 hours as needed upon transfer- attempting, with d/w pt and husband, to add MS Contin  15 mg BID and reduce Oxycodone  to 20 mg q4 hours  6/13- changed to Oxycontin  10 mg BID and continue Oxy 20 mg q4 hours prn 4. Mood/Behavior/Sleep:  Pt has severe anxiety Cymbalta  60 mg daily, BuSpar  15 mg 3 times daily, Atarax  25 mg 3 times daily as needed             -added Celexa  10 mg daily for anxiety- is better for anxiety than Duloxetine , but need Duloxetine  for pain control             -antipsychotic agents: N/A 5. Neuropsych/cognition: This patient is ?capable of making decisions on her own behalf. 6. Skin/Wound Care: Routine skin checks 7. Fluids/Electrolytes/Nutrition: Routine in and outs with follow-up chemistries 8.  Sarcoidosis/interstitial lung disease/chronic respiratory failure with hypoxia- Was on 4-5L at home- been doing 3-4L here- and concern for hypercarbia.  Continue chronic oxygen  therapy.  Prednisone  as directed, Lasix  20 mg Monday Wednesday Fridays.  Followed by Dr. Jolyne Needs pulmonary services [facility]  6/14- some sob this morning but VS ok. ?anxiety component  -she is receiving brovana  and pulmicort   scheduled with albuterol  prn  -added IS/ and FV today  -it appears she was taking prednisone  5mg  q morning at home but already on hydrocortisone  for Addison's   -hold off on any steroid changes at present   -OOB as possible  9.  Left femoral head avascular necrosis.Probably has bilaterally, based on chart review  Not a surgical candidate.  Conservative care  6/13- went over with therapy- that her goals from my point of view is to walk 15-25 ft, but probably not more- will not make AVN worse, but will hurt more-  10.  E. coli UTI as well as chronic urinary incontinence.  Status post 3-day course of IV Rocephin  completed.  Continue Flomax  0.4 mg daily.  Check PVR  6/14 pt is voiding continently. No PVR's but I'm not sure they'reneeded right now 11.  Normocytic anemia.  Borderline MCV of 96 and RDW of 16.  B12 and folate within normal limits.  Check CBC  6/14- CBC stable at 8.3--f/u Monday  12.  Addison's disease.  Continue hydrocortisone  chronically.  Followed by endocrinology services Dr.Gebreselassie Nida 13.  Chronic hypotension.  ProAmatine  5 mg twice daily 14.  Class III morbid obesity.  BMI 39.75.  Dietary follow-up 15.  Hyperlipidemia.  Crestor  16.  Hypothyroidism.  Synthroid  17.  Chronic constipation.  Linzess  72 mcg daily, Senokot S2 tablets bedtime, Chronulac  as needed  6/13- LBM 2 days ago- she says doesn't go every day 18.  GERD.  Protonix  19. Noncompliance- Per nursing, pt refusing to use TLSO for [facility] transfers, of note- will d/w pt that need to follow recs, or there's more risk of additional fractures. 20. Hypokalemia  6/13- will replete 40 mEq and then recheck Monday      LOS: 2 days A FACE TO FACE EVALUATION WAS PERFORMED  Kathleen [name] 02/08/2024, 10:43 AM

## 2024-02-09 ENCOUNTER — Inpatient Hospital Stay (HOSPITAL_COMMUNITY)

## 2024-02-09 DIAGNOSIS — S129XXD Fracture of neck, unspecified, subsequent encounter: Secondary | ICD-10-CM | POA: Diagnosis not present

## 2024-02-09 DIAGNOSIS — J849 Interstitial pulmonary disease, unspecified: Secondary | ICD-10-CM | POA: Diagnosis not present

## 2024-02-09 DIAGNOSIS — K5901 Slow transit constipation: Secondary | ICD-10-CM | POA: Diagnosis not present

## 2024-02-09 DIAGNOSIS — D869 Sarcoidosis, unspecified: Secondary | ICD-10-CM | POA: Diagnosis not present

## 2024-02-09 MED ORDER — PANTOPRAZOLE SODIUM 40 MG PO TBEC
40.0000 mg | DELAYED_RELEASE_TABLET | Freq: Two times a day (BID) | ORAL | Status: DC
  Administered 2024-02-09 – 2024-02-25 (×33): 40 mg via ORAL
  Filled 2024-02-09 (×34): qty 1

## 2024-02-09 MED ORDER — SENNOSIDES-DOCUSATE SODIUM 8.6-50 MG PO TABS
2.0000 | ORAL_TABLET | Freq: Two times a day (BID) | ORAL | Status: DC
  Administered 2024-02-10 – 2024-02-24 (×27): 2 via ORAL
  Filled 2024-02-09 (×33): qty 2

## 2024-02-09 MED ORDER — FLEET ENEMA RE ENEM
1.0000 | ENEMA | Freq: Once | RECTAL | Status: AC
  Administered 2024-02-09: 1 via RECTAL
  Filled 2024-02-09: qty 1

## 2024-02-09 NOTE — Progress Notes (Addendum)
 PROGRESS NOTE   Subjective/Complaints: Breathing better today. Woke up nauseas this morning. Had a bm yesterday but thinks she might need to go some more. Doesn't recall taking any medication or eating anything this morning to trigger sx  ROS: Patient denies fever, rash, sore throat, blurred vision, dizziness,  vomiting, diarrhea, cough, shortness of breath or chest pain, joint or back/neck pain, headache, or mood change.    Objective:   VAS US  LOWER EXTREMITY VENOUS (DVT) Result Date: 02/08/2024  Lower Venous DVT Study Patient Name:  Kathleen [name]  Date of Exam:   02/07/2024 Medical Rec #: [id number]       Accession #:    [id number] Date of Birth: 08-24-1961       Patient Gender: F Patient Age:   63 years Exam Location:  [facility] Procedure:      VAS US  LOWER EXTREMITY VENOUS (DVT) Referring Phys: Georjean Kite --------------------------------------------------------------------------------  Indications: Pain, and Swelling. Other Indications: Rehab patient. Limitations: Body habitus, poor ultrasound/tissue interface and pain intolerance. Comparison Study: Previous exam on 02/26/2023 was negative for DVT. Performing Technologist: Arlyce Berger RVT, RDMS  Examination Guidelines: A complete evaluation includes B-mode imaging, spectral Doppler, color Doppler, and power Doppler as needed of all accessible portions of each vessel. Bilateral testing is considered an integral part of a complete examination. Limited examinations for reoccurring indications may be performed as noted. The reflux portion of the exam is performed with the patient in reverse Trendelenburg.  +---------+---------------+---------+-----------+----------+-------------------+ RIGHT    CompressibilityPhasicitySpontaneityPropertiesThrombus Aging      +---------+---------------+---------+-----------+----------+-------------------+ CFV      Full           Yes      Yes                                       +---------+---------------+---------+-----------+----------+-------------------+ SFJ      Full                                                             +---------+---------------+---------+-----------+----------+-------------------+ FV Prox  Full           Yes      Yes                                      +---------+---------------+---------+-----------+----------+-------------------+ FV Mid   Full           Yes      Yes                                      +---------+---------------+---------+-----------+----------+-------------------+ FV DistalFull           Yes      Yes                                      +---------+---------------+---------+-----------+----------+-------------------+  PFV                     Yes      Yes                                      +---------+---------------+---------+-----------+----------+-------------------+ POP      Full           Yes      Yes                                      +---------+---------------+---------+-----------+----------+-------------------+ PTV      Full                                                             +---------+---------------+---------+-----------+----------+-------------------+ PERO                                                  Not well visualized +---------+---------------+---------+-----------+----------+-------------------+   +---------+---------------+---------+-----------+----------+-------------------+ LEFT     CompressibilityPhasicitySpontaneityPropertiesThrombus Aging      +---------+---------------+---------+-----------+----------+-------------------+ CFV      Full           Yes      Yes                                      +---------+---------------+---------+-----------+----------+-------------------+ SFJ      Full                                                              +---------+---------------+---------+-----------+----------+-------------------+ FV Prox  Full           Yes      Yes                                      +---------+---------------+---------+-----------+----------+-------------------+ FV Mid   Full           Yes      Yes                                      +---------+---------------+---------+-----------+----------+-------------------+ FV DistalFull           Yes      Yes                                      +---------+---------------+---------+-----------+----------+-------------------+ PFV      Full                                                             +---------+---------------+---------+-----------+----------+-------------------+  POP      Full           Yes      Yes                                      +---------+---------------+---------+-----------+----------+-------------------+ PTV                                                   Not well visualized +---------+---------------+---------+-----------+----------+-------------------+ PERO     Full                                                             +---------+---------------+---------+-----------+----------+-------------------+     Summary: BILATERAL: - No evidence of deep vein thrombosis seen in the lower extremities, bilaterally. -No evidence of popliteal cyst, bilaterally.   *See table(s) above for measurements and observations. Electronically signed by Genny Kid MD on 02/08/2024 at 10:56:51 AM.    Final    Recent Labs    02/06/24 1526 02/07/24 0735  WBC 7.4 7.4  HGB 8.5* 8.3*  HCT 29.7* 28.8*  PLT 432* 454*   Recent Labs    02/06/24 1526 02/07/24 0735  NA  --  142  K  --  3.4*  CL  --  97*  CO2  --  36*  GLUCOSE  --  104*  BUN  --  <5*  CREATININE 0.96 0.90  CALCIUM   --  8.8*    Intake/Output Summary (Last 24 hours) at 02/09/2024 0905 Last data filed at 02/08/2024 1812 Gross per 24 hour  Intake 720 ml  Output --   Net 720 ml        Physical Exam: Vital Signs Blood pressure (!) 120/44, pulse 92, temperature 98 F (36.7 C), resp. rate 18, height 5' 10 (1.778 m), weight 124.9 kg, last menstrual period 05/17/2016, SpO2 93%.   Constitutional: No distress . Vital signs reviewed. HEENT: NCAT, EOMI, oral membranes moist Neck: supple Cardiovascular: RRR without murmur. No JVD    Respiratory/Chest: CTA Bilaterally without wheezes or rales. Normal effort    GI/Abdomen: BS +, non-tender, distended Ext: no clubbing, cyanosis, or edema Psych: pleasant and cooperative  Musculoskeletal:     Cervical and low back: Tenderness present.     Comments: ongoing LE edema to ankles-  Ue's5-/5 in Deltoids, biceps, triceps; WE 4/5- give way weakness; Grip 4+/5 and FA 4+/5 B/L LE's- 5-/5 throughout B/L    Skin:    General: Skin is warm and dry.         Neurological:     Comments: Patient is   alert.   Oriented x 3 and follows commands. Good insight and awareness.   Moves all 4 limbs. CN exam non-focal. Fair insight Intact to light touch in all 4 extremities    Assessment/Plan: 1. Functional deficits which require 3+ hours per day of interdisciplinary therapy in a comprehensive inpatient rehab setting. Physiatrist is providing close team supervision and 24 hour management of active medical problems listed below. Physiatrist and rehab team continue to assess barriers to discharge/monitor patient progress toward  functional and medical goals  Care Tool:  Bathing    Body parts bathed by patient: Left arm, Right arm, Abdomen, Chest, Face   Body parts bathed by helper: Front perineal area, Buttocks, Right upper leg, Left upper leg, Right lower leg, Left lower leg     Bathing assist Assist Level: Maximal Assistance - Patient 24 - 49%     Upper Body Dressing/Undressing Upper body dressing   What is the patient wearing?: Orthosis Orthosis activity level: Performed by helper  Upper body assist Assist Level:  Total Assistance - Patient < 25%    Lower Body Dressing/Undressing Lower body dressing      What is the patient wearing?: Incontinence brief, Pants     Lower body assist Assist for lower body dressing: Total Assistance - Patient < 25%     Toileting Toileting    Toileting assist Assist for toileting: Total Assistance - Patient < 25%     Transfers Chair/bed transfer  Transfers assist     Chair/bed transfer assist level: Moderate Assistance - Patient 50 - 74%     Locomotion Ambulation   Ambulation assist      Assist level: Moderate Assistance - Patient 50 - 74% Assistive device: Walker-rolling Max distance: x3'   Walk 10 feet activity   Assist  Walk 10 feet activity did not occur: Safety/medical concerns (respiratory distress/ generalized weakness)        Walk 50 feet activity   Assist Walk 50 feet with 2 turns activity did not occur: Safety/medical concerns (respiratory distress/ generalized weakness)         Walk 150 feet activity   Assist Walk 150 feet activity did not occur: Safety/medical concerns (respiratory distress/ generalized weakness)         Walk 10 feet on uneven surface  activity   Assist Walk 10 feet on uneven surfaces activity did not occur: Safety/medical concerns (respiratory distress/ generalized weakness)         Wheelchair     Assist Is the patient using a wheelchair?: Yes Type of Wheelchair: Manual Wheelchair activity did not occur: Safety/medical concerns (respiratory distress/ generalized weakness)         Wheelchair 50 feet with 2 turns activity    Assist    Wheelchair 50 feet with 2 turns activity did not occur: Safety/medical concerns (respiratory distress/ generalized weakness)       Wheelchair 150 feet activity     Assist  Wheelchair 150 feet activity did not occur: Safety/medical concerns (respiratory distress/ generalized weakness)       Blood pressure (!) 120/44, pulse 92,  temperature 98 F (36.7 C), resp. rate 18, height 5' 10 (1.778 m), weight 124.9 kg, last menstrual period 05/17/2016, SpO2 93%.  Medical Problem List and Plan: 1. Functional deficits secondary to compression and multiple fractures of spine without SCI-intractable acute on chronic low back pain due to osteoporotic fracture involving L1, L3 and L4/acute left pubic symphysis and right sacral ala fracture.  Patient declined kyphoplasty.  Conservative management with narcotics and TLSO             -patient may  shower             -ELOS/Goals: 12-14 days- supervision to min A             -Continue CIR therapies including PT, OT  2.  Antithrombotics: -DVT/anticoagulation:  Pharmaceutical: Lovenox .  Check vascular study 6/13- Dopplers (-)             -  antiplatelet therapy: N/A 3. Pain Management/chronic pain followed at [facility]- usually takes 10 mg 6x/day: Flexeril  5 mg 3 times daily, lidoderm  patch, oxycodone  30 mg every 4 hours as needed upon transfer- attempting, with d/w pt and husband, to add MS Contin  15 mg BID and reduce Oxycodone  to 20 mg q4 hours  6/13- changed to Oxycontin  10 mg BID and continue Oxy 20 mg q4 hours prn 4. Mood/Behavior/Sleep:  Pt has severe anxiety Cymbalta  60 mg daily, BuSpar  15 mg 3 times daily, Atarax  25 mg 3 times daily as needed             -added Celexa  10 mg daily for anxiety- is better for anxiety than Duloxetine , but need Duloxetine  for pain control             -antipsychotic agents: N/A 5. Neuropsych/cognition: This patient is ?capable of making decisions on her own behalf. 6. Skin/Wound Care: Routine skin checks 7. Fluids/Electrolytes/Nutrition: Routine in and outs with follow-up chemistries 8.  Sarcoidosis/interstitial lung disease/chronic respiratory failure with hypoxia- Was on 4-5L at home- been doing 3-4L here- and concern for hypercarbia.  Continue chronic oxygen  therapy.  Prednisone  as directed, Lasix  20 mg Monday Wednesday Fridays.  Followed by  Dr. Jolyne Needs pulmonary services [facility]  6/14- some sob this morning but VS ok. ?anxiety component  -she is receiving brovana  and pulmicort  scheduled with albuterol  prn  -added IS/ and FV today  6/15 breathing better today. I asked her if she took prednisone  with hydrocortisone , and she told me she did at one point, but no longer.    -obsv for now  9.  Left femoral head avascular necrosis.Probably has bilaterally, based on chart review  Not a surgical candidate.  Conservative care  6/13- went over with therapy- that her goals from my point of view is to walk 15-25 ft, but probably not more- will not make AVN worse, but will hurt more-  10.  E. coli UTI as well as chronic urinary incontinence.  Status post 3-day course of IV Rocephin  completed.  Continue Flomax  0.4 mg daily.  Check PVR  6/14-15 pt is voiding continently. No PVR's but I'm not sure they'reneeded right now 11.  Normocytic anemia.  Borderline MCV of 96 and RDW of 16.  B12 and folate within normal limits.  Check CBC  6/14- CBC stable at 8.3--f/u Monday  12.  Addison's disease.  Continue hydrocortisone  chronically.  Followed by endocrinology services Dr.Gebreselassie Nida 13.  Chronic hypotension.  ProAmatine  5 mg twice daily 14.  Class III morbid obesity.  BMI 39.75.  Dietary follow-up 15.  Hyperlipidemia.  Crestor  16.  Hypothyroidism.  Synthroid  17.  Chronic constipation.  Linzess  72 mcg daily, Senokot S2 tablets bedtime, Chronulac  as needed  6/13- LBM 2 days ago- she says doesn't go every day  6/15 I suspect her nausea is related to this. We also started fosamax  yesterday. Discussed how oxycodone  can cause nausea and constipation   -  KUB today reveals moderate stool burden, no ileus   -will order fleets enema   -rx nausea, increase ppi to bid   -increase senna to bid 18.  GERD.  Protonix  19. Noncompliance- Per nursing, pt refusing to use TLSO for [facility] transfers, of note- will d/w pt that need to follow recs, or there's more risk  of additional fractures. 20. Hypokalemia  6/13- will replete 40 mEq and then recheck Monday      LOS: 3 days A FACE TO FACE EVALUATION WAS PERFORMED  Rawland Caddy 02/09/2024, 9:05 AM

## 2024-02-09 NOTE — Plan of Care (Signed)
  Problem: Consults Goal: RH SPINAL CORD INJURY PATIENT EDUCATION Description:  See Patient Education module for education specifics.  Outcome: Progressing   Problem: SCI BOWEL ELIMINATION Goal: RH STG MANAGE BOWEL WITH ASSISTANCE Description: STG Manage Bowel with toileting Assistance. Outcome: Progressing Goal: RH STG SCI MANAGE BOWEL WITH MEDICATION WITH ASSISTANCE Description: STG SCI Manage bowel with medication with mod I assistance. Outcome: Progressing Goal: RH STG SCI MANAGE BOWEL PROGRAM W/ASSIST OR AS APPROPRIATE Description: STG SCI Manage bowel program w/assist or as appropriate. Outcome: Progressing   Problem: SCI BLADDER ELIMINATION Goal: RH STG MANAGE BLADDER WITH ASSISTANCE Description: STG Manage Bladder With Assistance Outcome: Progressing Goal: RH STG MANAGE BLADDER WITH MEDICATION WITH ASSISTANCE Description: STG Manage Bladder With Medication With mod I Assistance. Outcome: Progressing Goal: RH STG SCI MANAGE BLADDER PROGRAM W/ASSISTANCE Description: Toileting protocol Outcome: Progressing   Problem: RH SAFETY Goal: RH STG ADHERE TO SAFETY PRECAUTIONS W/ASSISTANCE/DEVICE Description: STG Adhere to Safety Precautions With cues Assistance/Device. Outcome: Progressing   Problem: RH KNOWLEDGE DEFICIT SCI Goal: RH STG INCREASE KNOWLEDGE OF SELF CARE AFTER SCI Description: Patient and spouse will be able to manage care at discharge using educational resources for medication, skin care, transfers, toileting, dietary modification independently Outcome: Progressing

## 2024-02-10 ENCOUNTER — Inpatient Hospital Stay (HOSPITAL_COMMUNITY)

## 2024-02-10 DIAGNOSIS — S32020S Wedge compression fracture of second lumbar vertebra, sequela: Secondary | ICD-10-CM | POA: Diagnosis not present

## 2024-02-10 DIAGNOSIS — S129XXD Fracture of neck, unspecified, subsequent encounter: Secondary | ICD-10-CM | POA: Diagnosis not present

## 2024-02-10 DIAGNOSIS — M545 Low back pain, unspecified: Secondary | ICD-10-CM

## 2024-02-10 DIAGNOSIS — K5900 Constipation, unspecified: Secondary | ICD-10-CM

## 2024-02-10 DIAGNOSIS — E876 Hypokalemia: Secondary | ICD-10-CM

## 2024-02-10 DIAGNOSIS — F418 Other specified anxiety disorders: Secondary | ICD-10-CM

## 2024-02-10 DIAGNOSIS — J849 Interstitial pulmonary disease, unspecified: Secondary | ICD-10-CM | POA: Diagnosis not present

## 2024-02-10 DIAGNOSIS — D649 Anemia, unspecified: Secondary | ICD-10-CM

## 2024-02-10 DIAGNOSIS — G8929 Other chronic pain: Secondary | ICD-10-CM

## 2024-02-10 MED ORDER — OXYCODONE HCL ER 15 MG PO T12A
15.0000 mg | EXTENDED_RELEASE_TABLET | Freq: Two times a day (BID) | ORAL | Status: DC
  Administered 2024-02-10 – 2024-02-11 (×2): 15 mg via ORAL
  Filled 2024-02-10 (×2): qty 1

## 2024-02-10 NOTE — Consult Note (Signed)
 Neuropsychological Consultation Comprehensive Inpatient Rehab   Patient:   Kathleen [name]   DOB:   [birth year]  MR Number:  [id number]  Location:  [facility] [address] [place] [address] Dept: [phone] Loc: [phone]           Date of Service:   02/10/2024  Start Time:   10 AM End Time:   11 AM  Provider/Observer:  Chapman Commodore, Psy.D.       Clinical Neuropsychologist       Billing Code/Service: [phone]  Reason for Service:    Kathleen [name] is a 63 year old female referred for neuropsychological consultation due to coping issues with significant anxiety and depressive symptomatology and currently admitted to the comprehensive inpatient rehabilitation unit.  Patient has significant prior medical history including chronic hypoxic respiratory failure with interstitial lung disease/sarcoidosis and has been maintained on chronic prednisone  for many years.  Addison's disease, chronic hypotension, chronic anemia, chronic urinary incontinence, bilateral hip AVN, osteoporosis with multiple vertebral fractures and on chronic pain medications followed by [facility] pain clinic.  Patient recently admitted due to severe back pain resulting from fractures of L4 and then L3 and then readmitted for acute L1 fracture.  Patient was not viewed as a surgical candidate due to the status of her bone and their physical integrity likely from years of prednisone  use.  Patient's pulmonary status is quite impaired which greatly complicates any attempts at surgical intervention.  During today's clinical visit, it was noted that the patient is quite well-known to the author and I have worked with her for some time years ago.  I began working with her initially because of significant psychosocial stressors with anxiety and the patient was later diagnosed with lung disease which caused further difficulties.  Patient continued to work for  some time after her lung disease was identified and has begun being treated.  The patient has struggled with significant medical issues for greater than a decade.  Report was quick to be reestablished and we were able to talk about what the patient has gone on since I last saw her more than a decade ago.  The patient has had a number of ongoing psychosocial stressors but much of the things that have gone on when I initially saw her have improved and stabilized.  The patient has been dealing with significant medical illness since that time.  Today we worked on coping and adjustment issues and I will continue to follow the patient during her admission and we discussed having it referral made for outpatient follow-up postdischarge.  HPI for the current admission:    HPI: Kathleen [name] is a 63 year old right handed female with history of chronic hypoxic respiratory failure (5 L O2) interstitial lung disease followed by pulmonary services Dr. Jolyne Needs at [facility], sarcoidosis maintained on chronic prednisone , Addison's disease maintained on Cortef  followed by endocrinology services Dr.Gebreselassie Nida, chronic hypotension, chronic anemia, chronic urinary incontinence, class III obesity BMI 39.75, bilateral hip AVN and not felt to be a surgical candidate, osteoporosis with multiple vertebral fractures on chronic narcotics followed at the [facility] pain clinic and recent admissions for intractable back pain due to compression fractures of L4 (4/12 - 4/16) and then L3 (5/9 - 5/14), readmitted 01/20/2024 with acute L1 fracture discharged to home 01/23/2024 with [facility]. Per chart review patient lives with spouse. 1 level home one-step to entry. Patient readmitted 01/25/2024 with increasing low  back pain to [facility]. CT lumbar spine showed superior endplate fracture of L1 appearing stable with approximately 10 to 15% loss of height no retropulsion. Stable superior endplate fractures of L3 and L4  and also 25 to 30% loss of height and minimal retropulsion of the posterior superior aspect of the vertebral bodies without significant associated canal stenosis. CT of the pelvis showed acute left pubic symphyseal and right sacral ala compression fractures/L4 superior endplate fracture as well as avascular necrosis of the left femoral head with mild articular collapse. Admission chemistries unremarkable except chloride 94 CO2 30 glucose 145 alkaline phosphatase 129, hemoglobin 9.1, urine culture greater than 100,000 E. coli. Patient was transferred to [facility] due to increasing back pain. Interventional radiology consulted/Dr. Alvira Josephs and discussed at length kyphoplasty which patient declined.. TLSO back brace as directed. Hospital course patient did receive a 3-day course of intravenous ceftriaxone  for E. coli UTI 6/1 through 6/3. She was cleared to begin Lovenox  for DVT prophylaxis. Followed the distance by critical care medicine for pulmonary sarcoidosis/chronic hypoxic respiratory failure and currently on 4 L of oxygen  nasal cannula. Therapy evaluations completed due to patient's decreased functional mobility and intractable back pain was admitted for a comprehensive rehab program.   Medical History:   Past Medical History:  Diagnosis Date   Adrenal insufficiency (HCC)    Anemia    Anxiety    Avascular bone necrosis (HCC)    Breast fibrocystic disorder    Chronic pain    Encounter for long-term opiate analgesic use 02/02/2020   Patient has chronic pain with femoral head avascular necrosis.  Is under pain contract   Fibromyalgia    Gastroesophageal reflux disease    Hypothyroidism    Mitral valve prolapse    Orthostatic hypotension    Peripheral neuropathy    PONV (postoperative nausea and vomiting)    Restrictive lung disease    Moderate to severe   Sarcoidosis    Biopsy proven - [facility]   Sarcoidosis    SOB (shortness of breath)    chronic         Patient Active Problem  List   Diagnosis Date Noted   Spinal compression fracture (HCC) 01/29/2024   Lower back pain 01/26/2024   Closed compression fracture of L3 lumbar vertebra, initial encounter (HCC) 01/21/2024   Intractable low back pain 01/03/2024   Ambulatory dysfunction 12/11/2023   Uncontrolled pain 12/08/2023   Pressure injury of skin 12/08/2023   Intractable back pain 12/07/2023   Prediabetes 10/28/2023   Ankle fracture, bimalleolar, closed, left, sequela 03/10/2023   Closed fracture of left ankle 03/10/2023   Syndesmotic disruption of ankle, left, initial encounter 03/10/2023   Lumbar transverse process fracture (HCC) 01/02/2023   Acute on chronic hypoxic respiratory failure (HCC) 12/13/2022   Depression, major, single episode, moderate (HCC) 09/10/2022   Aortic atherosclerosis (HCC) 09/10/2022   Chronic respiratory failure with hypoxia (HCC) 05/22/2021   Anxiety with depression 05/22/2021   Vitamin D  deficiency 05/09/2021   Left foot drop 01/09/2021   Pulmonary HTN (HCC) 10/12/2020   Encounter for long-term opiate analgesic use 02/02/2020   Interstitial lung disease (HCC) 04/14/2019   GERD (gastroesophageal reflux disease) 10/15/2017   Personal history of noncompliance with medical treatment, presenting hazards to health 04/26/2017   Hypothyroidism 01/15/2017   Adrenal insufficiency (Addison's disease) (HCC) 08/22/2016   Symptomatic bradycardia    Adrenal insufficiency (HCC) 08/17/2016   Hypokalemia 08/17/2016   Chronic pain syndrome 04/20/2016   Irritable bowel  syndrome with constipation 11/14/2015   Avascular necrosis of bone of hip, left (HCC) 04/05/2015   Post herpetic neuralgia 10/22/2014   Mixed hyperlipidemia 08/25/2014   Obstructive sleep apnea 02/18/2014   Fibromyalgia 02/11/2013   Bilateral lower extremity edema 01/22/2013   Hypotension 01/16/2013   Fibrocystic breast disease in female 04/09/2012   Sarcoidosis 06/21/2008    Behavioral Observation/Mental Status:   VERNITA TAGUE  presents as a 63 y.o.-year-old Right handed Caucasian Female who appeared her stated age. her dress was Appropriate and she was Well Groomed and her manners were Appropriate to the situation.  her participation was indicative of Appropriate behaviors.  There were physical disabilities noted.  she displayed an appropriate level of cooperation and motivation.    Interactions:    Active Appropriate  Attention:   abnormal and attention span appeared shorter than expected for age  Memory:   within normal limits; recent and remote memory intact  Visuo-spatial:   not examined  Speech (Volume):  low  Speech:   normal; normal  Thought Process:  Coherent and Relevant  Coherent and Linear  Though Content:  Rumination; not suicidal and not homicidal  Orientation:   person, place, time/date, and situation  Judgment:   Fair  Planning:   Fair  Affect:    Anxious  Mood:    Anxious  Insight:   Good  Intelligence:   normal  Psychiatric History:  Patient with significant past psychiatric history with anxiety and depressive symptoms.  Patient continuing with home medications including Cymbalta , BuSpar  and Celexa .  Patient has been followed for years by Charlotta Cook, MD and it appears that these medications for mood have been taking over by another physician.  Abuse/Trauma History: Patient has had considerable stressful and traumatic experiences through the years.   History of Substance Use or Abuse:  No concerns of substance abuse are reported.  However, the patient is on long-term opiate based pain medications and is being followed by [facility] medical for pain management.  Family Med/Psych History:  Family History  Problem Relation Age of Onset   Cardiomyopathy Mother    Breast cancer Mother    Prostate cancer Father    Heart attack Maternal Grandmother    Heart attack Maternal Grandfather    Heart attack Paternal Grandmother    Bipolar disorder Daughter    Heart disease  Maternal Aunt    Neurofibromatosis Cousin    Colon cancer Neg Hx     Impression/DX:   BRYAN OMURA is a 63 year old female referred for neuropsychological consultation due to coping issues with significant anxiety and depressive symptomatology and currently admitted to the comprehensive inpatient rehabilitation unit.  Patient has significant prior medical history including chronic hypoxic respiratory failure with interstitial lung disease/sarcoidosis and has been maintained on chronic prednisone  for many years.  Addison's disease, chronic hypotension, chronic anemia, chronic urinary incontinence, bilateral hip AVN, osteoporosis with multiple vertebral fractures and on chronic pain medications followed by [facility] pain clinic.  Patient recently admitted due to severe back pain resulting from fractures of L4 and then L3 and then readmitted for acute L1 fracture.  Patient was not viewed as a surgical candidate due to the status of her bone and their physical integrity likely from years of prednisone  use.  Patient's pulmonary status is quite impaired which greatly complicates any attempts at surgical intervention.  During today's clinical visit, it was noted that the patient is quite well-known to the author and I have worked with her for  some time years ago.  I began working with her initially because of significant psychosocial stressors with anxiety and the patient was later diagnosed with lung disease which caused further difficulties.  Patient continued to work for some time after her lung disease was identified and has begun being treated.  The patient has struggled with significant medical issues for greater than a decade.  Report was quick to be reestablished and we were able to talk about what the patient has gone on since I last saw her more than a decade ago.  The patient has had a number of ongoing psychosocial stressors but much of the things that have gone on when I initially saw her have improved  and stabilized.  The patient has been dealing with significant medical illness since that time.  Today we worked on coping and adjustment issues and I will continue to follow the patient during her admission and we discussed having it referral made for outpatient follow-up postdischarge.  Disposition/Plan:  I will make myself available for follow-up postdischarge and will check on her again during her admission.  Diagnosis:    Anxiety with depression         Electronically Signed   _______________________ Chapman Commodore, Psy.D. Clinical Neuropsychologist

## 2024-02-10 NOTE — Progress Notes (Signed)
 Occupational Therapy Session Note  Patient Details  Name: Kathleen [name] MRN: [id number] Date of Birth: [birth year]  Today's Date: 02/10/2024 OT Individual Time: 1445-1535 OT Individual Time Calculation (min): 50 min    Short Term Goals: Week 1:  OT Short Term Goal 1 (Week 1): Pt will perform stand pivot transfer to the toilet/ BSC with RW with min A consistently OT Short Term Goal 2 (Week 1): Pt will perform toileting with max A 2/3 steps OT Short Term Goal 3 (Week 1): Pt will perform bed mobility with min A from flat bed with bed rails OT Short Term Goal 4 (Week 1): Pt will be able to don LB clothing (underwear/ pants ) with mi nA  Skilled Therapeutic Interventions/Progress Updates:      Therapy Documentation Precautions:  Precautions Precautions: Fall Recall of Precautions/Restrictions: Intact Precaution/Restrictions Comments: TLSO brace (when ambulating), pt refused to don at evaluation Required Braces or Orthoses: Spinal Brace Spinal Brace: Thoracolumbosacral orthotic Restrictions Weight Bearing Restrictions Per Provider Order: No General: "I really am trying Abraham Hoffmann" Pt supine in bed upon OT arrival, agreeable to OT session.  Vital Signs: stable vitals on continuous monitor throughout session  Pain:  10/10 pain reported in back, activity, intermittent rest breaks, distractions provided for pain management, pt reports tolerable to proceed.   Exercises: Pt completed the following exercise circuit in order to improve functional activity, strength and endurance to prepare for ADLs such as bathing. Pt completed the following exercises in supine position with no noted LOB/SOB and 4x10 repetitions on each exercise: -leg raises -forward punches  Other Treatments: OT providing therapeutic use of self in order to build rapport and discuss patient current situation and goals for therapy. Pt telling OT about air mattress deflating earlier in day, reporting increased pain with incident.  MD and nsg already aware.    Pt supine in bed with bed alarm activated, 2 bed rails up, call light within reach and 4Ps assessed.    Therapy/Group: Individual Therapy  Nila Barth, OTD, OTR/L 02/10/2024, 4:19 PM

## 2024-02-10 NOTE — Progress Notes (Signed)
 Physical Therapy Session Note  Patient Details  Name: Kathleen [name] MRN: [id number] Date of Birth: June 15, 1961  Today's Date: 02/10/2024 PT Individual Time: 1300-1345 PT Individual Time Calculation (min): 45 min   Short Term Goals: Week 1:  PT Short Term Goal 1 (Week 1): Pt will complete rolling in bed with supervision PT Short Term Goal 2 (Week 1): Pt will complete supine<->sit with CGA. PT Short Term Goal 3 (Week 1): Pt will complete sit to stand with CGA + LRAD PT Short Term Goal 4 (Week 1): Pt will maintain dynamic standing balance with minA + LRAD  Skilled Therapeutic Interventions/Progress Updates:    Pt has met STG rolling in bed. Pt sitting in recliner with TLSO brace on.  Pt c/o brace being uncomfortable, feels like it's choking her.  PT to remove and reapply each time for sit<->stand.  Pt c/o 10/10 LBP.  Asked RN for pain meds and limited participation due to pain initially.  Pt performed LE ex's in recliner: ankle pumps, LAQs, heel slides, hip abd/add; x10 each. RN provided pain meds, mm relaxer, anti anxiety/nausea meds during session.   Pt willing to participate with standing activity.  Sit<->stand from recliner to RW with min/mod assist.  Once standing, pt becomes fixated on SPO2 sats and becomes anxious as numbers decline down to 74%.   Pt requires moderate amount of instructions for pursed lip breathing, relaxation. Pt standing with shoulders elevated, unable to tolerate standing >15-20. Pt lacks eccentric control and cries out in pain as she flops into recliner. Pt will recover quickly once in chair, SPO2 95%.  At end of session, pt requesting to return back to bed. Recliner<->bed transfer with RW CGA. Pt able to transfer self into bed and will scream due to back pain.  Pt requesting to get comfortable, will be anxious as she is not at the [facility]. PT x2 repositioned pt up closer to [facility], LE propped on pillows.  Pt states she is trying and apologizes.  Call bell/remove/phone all in  pt's reach at end of session.    Therapy Documentation Precautions:  Precautions Precautions: Fall Recall of Precautions/Restrictions: Intact Precaution/Restrictions Comments: TLSO brace (when ambulating), pt refused to don at evaluation Required Braces or Orthoses: Spinal Brace Spinal Brace: Thoracolumbosacral orthotic Restrictions Weight Bearing Restrictions Per Provider Order: No General:   Pain: Pain Assessment Pain Scale: 0-10 Pain Score: 10-Worst pain ever Pain Location: Back Pain Intervention(s): Medication (See eMAR)     Therapy/Group: Individual Therapy  Yong Henle 02/10/2024, 4:35 PM

## 2024-02-10 NOTE — Progress Notes (Signed)
 Physical Therapy Session Note  Patient Details  Name: Kathleen [name] MRN: [id number] Date of Birth: Nov 22, 1960  Today's Date: 02/10/2024 PT Individual Time: 1115-1203 PT Individual Time Calculation (min): 48 min   Short Term Goals: Week 1:  PT Short Term Goal 1 (Week 1): Pt will complete rolling in bed with supervision PT Short Term Goal 2 (Week 1): Pt will complete supine<->sit with CGA. PT Short Term Goal 3 (Week 1): Pt will complete sit to stand with CGA + LRAD PT Short Term Goal 4 (Week 1): Pt will maintain dynamic standing balance with minA + LRAD  Skilled Therapeutic Interventions/Progress Updates:  Session focused on functional bed mobility, overall activity tolerance, sit <> stands and transfer with RW. Pt requires extra time due to pain management and anxiety - pt given cues for pursed lip breathing. Generally sats remained > 90% but with activity would drop into the 70's - unclear if truly an accurate read as the waveform was not consistent. Donned TLSO EOB. Repeated sit <.> stands with RW with extended rest breaks x 4 trials total with overall min A and cues for technique. Pt able to tolerate up to about 1 min of standing before fatigue or pain required her to sit. Engaged in some weightshifting and marches to prepare for gait. Pt required use of BSC with min A for stand step and total A for clothing management using RW for transfer. Returned back to bed with min A for RLE management and repositioned using bed controls and encouraging pt push through BLE to increase independence with bed mobility. Repositioned with all needs in reach and family at bedside.   Therapy Documentation Precautions:  Precautions Precautions: Fall Recall of Precautions/Restrictions: Intact Precaution/Restrictions Comments: TLSO brace (when ambulating), pt refused to don at evaluation Required Braces or Orthoses: Spinal Brace Spinal Brace: Thoracolumbosacral orthotic Restrictions Weight Bearing  Restrictions Per Provider Order: No    Vital Signs: Oxygen  Therapy SpO2: 70-96 % O2 Device: Nasal Cannula O2 Flow Rate (L/min): 5 L/min Pain: 10/10 pain in back with movement - premedicated per nursing and pt report. Using breathing techniques during mobility.     Therapy/Group: Individual Therapy  Gita Lamb Amadeo June, PT, DPT, CBIS  02/10/2024, 12:26 PM

## 2024-02-10 NOTE — Progress Notes (Signed)
 Patients air mattress deflated today while she was lying in bed. She stated she believes she may have fractured/broken another bone in her back due to bed deflating and her being on the metal frame. I moved her to chair while waiting on new bed to arrive on the unit. I msgd Dan the PA to make him aware, as well as Lylia Sand, MD.

## 2024-02-10 NOTE — Progress Notes (Signed)
 Met with patient to review current situation, team conference and plan of care. Patient in a lot of pain per nurse she has given all her due and prn  pain meds. K pad placed while patient in bed. Continue to follow along to provide educational needs to facilitate preparation for discharge.

## 2024-02-10 NOTE — Progress Notes (Signed)
 PROGRESS NOTE   Subjective/Complaints: Reports good results with enema yesterday. Continue to have severe pain in her back.  She asked about foley so she wouldn't need to get up at night- discussed this causes in crease risk UTI.   ROS: Patient denies fever, rash, sore throat, blurred vision, dizziness,  vomiting, diarrhea, cough, shortness of breath or chest pain, joint or back/neck pain, headache, or mood change.  + back pain   Objective:   DG Abd 1 View Result Date: 02/09/2024 CLINICAL DATA:  [id number] Ileus (HCC) [id number] EXAM: ABDOMEN - 1 VIEW COMPARISON:  Jan 25, 2024 FINDINGS: Evaluation is limited by under penetration. Air and stool filled loops of bowel. No discretely dilated loops of bowel are visualized. Air is visualized in the rectum. Moderate colonic stool burden. Patchy bibasilar opacities. Degenerative changes of the thoracolumbar spine with a similar compression fracture deformity of the lower thoracic spine. Status post cholecystectomy. IMPRESSION: 1. No discretely dilated loops of bowel are visualized within the limitations of the exam. 2. Patchy bibasilar opacities, possibly atelectasis. Electronically Signed   By: Clancy Crimes M.D.   On: 02/09/2024 09:06   No results for input(s): WBC, HGB, HCT, PLT in the last 72 hours.  No results for input(s): NA, K, CL, CO2, GLUCOSE, BUN, CREATININE, CALCIUM  in the last 72 hours.   Intake/Output Summary (Last 24 hours) at 02/10/2024 1058 Last data filed at 02/09/2024 1300 Gross per 24 hour  Intake 480 ml  Output --  Net 480 ml        Physical Exam: Vital Signs Blood pressure 109/61, pulse 88, temperature (!) 97.5 F (36.4 C), resp. rate 16, height 5' 10 (1.778 m), weight 124.9 kg, last menstrual period 05/17/2016, SpO2 96%.   Constitutional: No distress . Vital signs reviewed. HEENT: NCAT, EOMI, oral membranes moist Neck:  supple Cardiovascular: RRR without murmur. No JVD    Respiratory/Chest: CTA Bilaterally without wheezes or rales. Normal effort    GI/Abdomen: BS +, non-tender, distended Ext: no clubbing, cyanosis, or edema Psych: pleasant and cooperative  Musculoskeletal:     Cervical and low back: Tenderness present.     Comments: ongoing LE edema to ankles-  Ue's5-/5 in Deltoids, biceps, triceps; WE 4/5- give way weakness; Grip 4+/5 and FA 4+/5 B/L LE's- 5-/5 throughout B/L    Skin:    General: Skin is warm and dry.         Neurological:     Comments: Patient is   alert.   Oriented x 3 and follows commands. Good insight and awareness.   Moves all 4 limbs. CN exam non-focal. Fair insight Intact to light touch in all 4 extremities   Prior neuro assessment is c/w today's exam 02/10/2024.    Assessment/Plan: 1. Functional deficits which require 3+ hours per day of interdisciplinary therapy in a comprehensive inpatient rehab setting. Physiatrist is providing close team supervision and 24 hour management of active medical problems listed below. Physiatrist and rehab team continue to assess barriers to discharge/monitor patient progress toward functional and medical goals  Care Tool:  Bathing    Body parts bathed by patient: Left arm, Right arm, Abdomen, Chest, Face   Body  parts bathed by helper: Front perineal area, Buttocks, Right upper leg, Left upper leg, Right lower leg, Left lower leg     Bathing assist Assist Level: Maximal Assistance - Patient 24 - 49%     Upper Body Dressing/Undressing Upper body dressing   What is the patient wearing?: Orthosis Orthosis activity level: Performed by helper  Upper body assist Assist Level: Total Assistance - Patient < 25%    Lower Body Dressing/Undressing Lower body dressing      What is the patient wearing?: Incontinence brief, Pants     Lower body assist Assist for lower body dressing: Total Assistance - Patient < 25%      Toileting Toileting    Toileting assist Assist for toileting: Total Assistance - Patient < 25%     Transfers Chair/bed transfer  Transfers assist     Chair/bed transfer assist level: Moderate Assistance - Patient 50 - 74%     Locomotion Ambulation   Ambulation assist      Assist level: Moderate Assistance - Patient 50 - 74% Assistive device: Walker-rolling Max distance: x3'   Walk 10 feet activity   Assist  Walk 10 feet activity did not occur: Safety/medical concerns        Walk 50 feet activity   Assist Walk 50 feet with 2 turns activity did not occur: Safety/medical concerns         Walk 150 feet activity   Assist Walk 150 feet activity did not occur: Safety/medical concerns         Walk 10 feet on uneven surface  activity   Assist Walk 10 feet on uneven surfaces activity did not occur: Safety/medical concerns         Wheelchair     Assist Is the patient using a wheelchair?: Yes Type of Wheelchair: Manual Wheelchair activity did not occur: Safety/medical concerns         Wheelchair 50 feet with 2 turns activity    Assist    Wheelchair 50 feet with 2 turns activity did not occur: Safety/medical concerns       Wheelchair 150 feet activity     Assist  Wheelchair 150 feet activity did not occur: Safety/medical concerns       Blood pressure 109/61, pulse 88, temperature (!) 97.5 F (36.4 C), resp. rate 16, height 5' 10 (1.778 m), weight 124.9 kg, last menstrual period 05/17/2016, SpO2 96%.  Medical Problem List and Plan: 1. Functional deficits secondary to compression and multiple fractures of spine without SCI-intractable acute on chronic low back pain due to osteoporotic fracture involving L1, L3 and L4/acute left pubic symphysis and right sacral ala fracture.  Patient declined kyphoplasty.  Conservative management with narcotics and TLSO             -patient may  shower             -ELOS/Goals: 12-14 days-  supervision to min A             -Continue CIR therapies including PT, OT   -Team conference tomorrow 2.  Antithrombotics: -DVT/anticoagulation:  Pharmaceutical: Lovenox .  Check vascular study 6/13- Dopplers (-)             -antiplatelet therapy: N/A 3. Pain Management/chronic pain followed at [facility] pain clinic- usually takes 10 mg 6x/day: Flexeril  5 mg 3 times daily, lidoderm  patch, oxycodone  30 mg every 4 hours as needed upon transfer- attempting, with d/w pt and husband, to add MS Contin  15 mg BID and reduce  Oxycodone  to 20 mg q4 hours  6/13- changed to Oxycontin  10 mg BID and continue Oxy 20 mg q4 hours prn  6/16 increase oxycontin  to 15mg  Q12h 4. Mood/Behavior/Sleep:  Pt has severe anxiety Cymbalta  60 mg daily, BuSpar  15 mg 3 times daily, Atarax  25 mg 3 times daily as needed             -added Celexa  10 mg daily for anxiety- is better for anxiety than Duloxetine , but need Duloxetine  for pain control             -antipsychotic agents: N/A 5. Neuropsych/cognition: This patient is ?capable of making decisions on her own behalf. 6. Skin/Wound Care: Routine skin checks 7. Fluids/Electrolytes/Nutrition: Routine in and outs with follow-up chemistries 8.  Sarcoidosis/interstitial lung disease/chronic respiratory failure with hypoxia- Was on 4-5L at home- been doing 3-4L here- and concern for hypercarbia.  Continue chronic oxygen  therapy.  Prednisone  as directed, Lasix  20 mg Monday Wednesday Fridays.  Followed by Dr. Jolyne Needs pulmonary services [facility]  6/14- some sob this morning but VS ok. ?anxiety component  -she is receiving brovana  and pulmicort  scheduled with albuterol  prn  -added IS/ and FV today  6/15 breathing better today. I asked her if she took prednisone  with hydrocortisone , and she told me she did at one point, but no longer.    -obsv for now  6/16 breathing doing ok, continue to monitor  SpO2: 96 % O2 Flow Rate (L/min): 5 L/min  9.  Left femoral head avascular necrosis.Probably  has bilaterally, based on chart review  Not a surgical candidate.  Conservative care  6/13- went over with therapy- that her goals from my point of view is to walk 15-25 ft, but probably not more- will not make AVN worse, but will hurt more-  10.  E. coli UTI as well as chronic urinary incontinence.  Status post 3-day course of IV Rocephin  completed.  Continue Flomax  0.4 mg daily.  Check PVR  6/14-15 pt is voiding continently. No PVR's but I'm not sure they'reneeded right now  6/16 check PVRs, pt reports she doesn't empty completly 11.  Normocytic anemia.  Borderline MCV of 96 and RDW of 16.  B12 and folate within normal limits.  Check CBC  6/14- CBC stable at 8.3  Order CBC for tomorrow 12.  Addison's disease.  Continue hydrocortisone  chronically.  Followed by endocrinology services Dr.Gebreselassie Nida 13.  Chronic hypotension.  ProAmatine  5 mg twice daily 14.  Class III morbid obesity.  BMI 39.75.  Dietary follow-up 15.  Hyperlipidemia.  Crestor  16.  Hypothyroidism.  Synthroid  17.  Chronic constipation.  Linzess  72 mcg daily, Senokot S2 tablets bedtime, Chronulac  as needed  6/13- LBM 2 days ago- she says doesn't go every day  6/15 I suspect her nausea is related to this. We also started fosamax  yesterday. Discussed how oxycodone  can cause nausea and constipation   -  KUB today reveals moderate stool burden, no ileus   -will order fleets enema   -rx nausea, increase ppi to bid   -increase senna to bid  6/16 reports good results with enema yesterday, continue to monitor  18.  GERD.  Protonix  19. Noncompliance- Per nursing, pt refusing to use TLSO for [facility] transfers, of note- will d/w pt that need to follow recs, or there's more risk of additional fractures. 20. Hypokalemia  6/13- will replete 40 mEq and then recheck Monday  Order labs for tomorrow AM   Addendum: ordered xray L spine due to worse  pain in back after pt had increased back pain after issue with mattress  LOS: 4 days A  FACE TO FACE EVALUATION WAS PERFORMED  Kathleen [name] 02/10/2024, 10:58 AM

## 2024-02-10 NOTE — Progress Notes (Addendum)
 Physical Therapy Assessment and Plan  Patient Details  Name: Kathleen [name] MRN: [id number] Date of Birth: 1961-07-25  PT Diagnosis: Abnormality of gait, Difficulty walking, Low back pain, Muscle spasms, Muscle weakness, Osteoarthritis, and Pain in joint Rehab Potential: Fair ELOS: 14 days   Today's Date: 02/10/2024 PT Individual Time:  1:00pm-2:00pm       Hospital Problem: Principal Problem:   Spinal compression fracture (HCC) Active Problems:   Intractable back pain   Past Medical History:  Past Medical History:  Diagnosis Date   Adrenal insufficiency (HCC)    Anemia    Anxiety    Avascular bone necrosis (HCC)    Breast fibrocystic disorder    Chronic pain    Encounter for long-term opiate analgesic use 02/02/2020   Patient has chronic pain with femoral head avascular necrosis.  Is under pain contract   Fibromyalgia    Gastroesophageal reflux disease    Hypothyroidism    Mitral valve prolapse    Orthostatic hypotension    Peripheral neuropathy    PONV (postoperative nausea and vomiting)    Restrictive lung disease    Moderate to severe   Sarcoidosis    Biopsy proven - [facility]   Sarcoidosis    SOB (shortness of breath)    chronic   Past Surgical History:  Past Surgical History:  Procedure Laterality Date   BIOPSY  10/17/2018   Procedure: BIOPSY;  Surgeon: Ruby Corporal, MD;  [facility];  Service: Endoscopy;;  duodenum, antrum, gastric body   BIOPSY  03/03/2021   Procedure: BIOPSY;  Surgeon: Ruby Corporal, MD;  [facility];  Service: Endoscopy;;   BIOPSY  09/11/2023   Procedure: BIOPSY;  Surgeon: Suzette Espy, MD;  [facility];  Service: Endoscopy;;   BREAST LUMPECTOMY Bilateral 01/12/2011   CHOLECYSTECTOMY N/A 04/17/2021   Procedure: LAPAROSCOPIC CHOLECYSTECTOMY;  Surgeon: Alanda Allegra, MD;  Location: [facility];  Service: General;  Laterality: N/A;   COLONOSCOPY WITH PROPOFOL  N/A 01/03/2022   Procedure: COLONOSCOPY WITH  PROPOFOL ;  Surgeon: Ruby Corporal, MD;  [facility];  Service: Endoscopy;  Laterality: N/A;  220   ESOPHAGEAL DILATION N/A 03/03/2021   Procedure: ESOPHAGEAL DILATION;  Surgeon: Ruby Corporal, MD;  [facility];  Service: Endoscopy;  Laterality: N/A;   ESOPHAGOGASTRODUODENOSCOPY (EGD) WITH PROPOFOL  N/A 10/17/2018   Procedure: ESOPHAGOGASTRODUODENOSCOPY (EGD) WITH PROPOFOL ;  Surgeon: Ruby Corporal, MD;  [facility];  Service: Endoscopy;  Laterality: N/A;  2:25   ESOPHAGOGASTRODUODENOSCOPY (EGD) WITH PROPOFOL  N/A 03/03/2021   Procedure: ESOPHAGOGASTRODUODENOSCOPY (EGD) WITH PROPOFOL ;  Surgeon: Ruby Corporal, MD;  [facility];  Service: Endoscopy;  Laterality: N/A;  12:15   ESOPHAGOGASTRODUODENOSCOPY (EGD) WITH PROPOFOL  N/A 09/11/2023   Procedure: ESOPHAGOGASTRODUODENOSCOPY (EGD) WITH PROPOFOL ;  Surgeon: Suzette Espy, MD;  [facility];  Service: Endoscopy;  Laterality: N/A;  2:30 pm, asa 3, LM to see if pt can come earlier   [facility] DILATION N/A 09/11/2023   Procedure: Londa Rival DILATION;  Surgeon: Suzette Espy, MD;  [facility];  Service: Endoscopy;  Laterality: N/A;   ORIF ANKLE FRACTURE Left 03/10/2023   Procedure: OPEN REDUCTION INTERNAL FIXATION (ORIF) ANKLE FRACTURE;  Surgeon: Diedra Fowler, MD;  Location: [facility];  Service: Orthopedics;  Laterality: Left;   POLYPECTOMY  01/03/2022   Procedure: POLYPECTOMY INTESTINAL;  Surgeon: Ruby Corporal, MD;  [facility];  Service: Endoscopy;;   TUBAL  LIGATION      Assessment & Plan Clinical Impression:  63 year old right handed female with history of chronic hypoxic respiratory failure (5 L O2) interstitial lung disease followed by pulmonary services Dr. Jolyne Needs at [facility], sarcoidosis maintained on chronic prednisone , Addison's disease maintained on Cortef  followed by endocrinology services Dr.Gebreselassie Nida, chronic hypotension, chronic anemia, chronic  urinary incontinence, class III obesity BMI 39.75, bilateral hip AVN and not felt to be a surgical candidate, osteoporosis with multiple vertebral fractures on chronic narcotics followed at the [facility] pain clinic and recent admissions for intractable back pain due to compression fractures of L4 (4/12 - 4/16) and then L3 (5/9 - 5/14), readmitted 01/20/2024 with acute L1 fracture discharged to home 01/23/2024 with [facility]. Per chart review patient lives with spouse. 1 level home one-step to entry. Patient readmitted 01/25/2024 with increasing low back pain to [facility]. CT lumbar spine showed superior endplate fracture of L1 appearing stable with approximately 10 to 15% loss of height no retropulsion. Stable superior endplate fractures of L3 and L4 and also 25 to 30% loss of height and minimal retropulsion of the posterior superior aspect of the vertebral bodies without significant associated canal stenosis. CT of the pelvis showed acute left pubic symphyseal and right sacral ala compression fractures/L4 superior endplate fracture as well as avascular necrosis of the left femoral head with mild articular collapse. Admission chemistries unremarkable except chloride 94 CO2 30 glucose 145 alkaline phosphatase 129, hemoglobin 9.1, urine culture greater than 100,000 E. coli. Patient was transferred to [facility] due to increasing back pain. Interventional radiology consulted/Dr. Alvira Josephs and discussed at length kyphoplasty which patient declined.. TLSO back brace as directed. Hospital course patient did receive a 3-day course of intravenous ceftriaxone  for E. coli UTI 6/1 through 6/3. She was cleared to begin Lovenox  for DVT prophylaxis. Followed the distance by critical care medicine for pulmonary sarcoidosis/chronic hypoxic respiratory failure and currently on 4 L of oxygen  nasal cannula. Therapy evaluations completed due to patient's decreased functional mobility and intractable  back pain was admitted for a comprehensive rehab program. .  Patient transferred to CIR on 02/06/2024 .   Patient currently requires mod with mobility secondary to muscle weakness and muscle joint tightness and decreased cardiorespiratoy endurance and decreased oxygen  support.  Prior to hospitalization, patient was supervision with mobility and lived with Spouse in a House home.  Home access is 1Stairs to enter.  Patient will benefit from skilled PT intervention to maximize safe functional mobility, minimize fall risk, and decrease caregiver burden for planned discharge home with 24 hour supervision.  Anticipate patient will benefit from follow up HH at discharge.  PT - End of Session Activity Tolerance: Tolerates < 10 min activity with changes in vital signs Endurance Deficit Description: Pt on 4 L/min via [place] during activity PT Assessment Rehab Potential (ACUTE/IP ONLY): Fair PT Barriers to Discharge: Incontinence;Weight PT Patient demonstrates impairments in the following area(s): Balance;Endurance;Motor;Pain;Safety PT Transfers Functional Problem(s): Bed Mobility;Bed to Chair;Car;Furniture PT Locomotion Functional Problem(s): Ambulation;Wheelchair Mobility;Stairs PT Plan PT Intensity: Minimum of 1-2 x/day ,45 to 90 minutes PT Frequency: 5 out of 7 days PT Duration Estimated Length of Stay: 14 days PT Treatment/Interventions: Ambulation/gait training;Balance/vestibular training;Cognitive remediation/compensation;DME/adaptive equipment instruction;Discharge planning;Functional mobility training;Disease management/prevention;Neuromuscular re-education;Pain management;Patient/family education;Community reintegration;Psychosocial support;Stair training;UE/LE Strength taining/ROM;Therapeutic Exercise;Therapeutic Activities;Wheelchair propulsion/positioning PT Transfers Anticipated Outcome(s): pt will perform all bed mobility with supervision overall except with car transfers min assist PT Locomotion  Anticipated Outcome(s): ambulatorly status with LRAD with  supervision, min assist for stair negotiation PT Recommendation Recommendations for Other Services: Neuropsych consult Follow Up Recommendations: Home health PT Equipment Recommended: To be determined   PT Evaluation Precautions/Restrictions Precautions Precautions: Fall Recall of Precautions/Restrictions: Intact Precaution/Restrictions Comments: TLSO brace (when ambulating), pt refused to don at evaluation Required Braces or Orthoses: Spinal Brace Spinal Brace: Thoracolumbosacral orthotic Restrictions Weight Bearing Restrictions Per Provider Order: No  Pain 10/10 LB Pain Interference Pain Interference Pain Effect on Sleep: 4. Almost constantly Pain Interference with Therapy Activities: 4. Almost constantly Pain Interference with Day-to-Day Activities: 8. Unable to answer;4. Almost constantly Home Living/Prior Functioning Home Living Available Help at Discharge: Family;Available PRN/intermittently Type of Home: House Home Access: Stairs to enter [employer] of Steps: 1 Entrance Stairs-Rails: None Home Layout: One level Bathroom Shower/Tub: Engineer, manufacturing systems: Handicapped height Bathroom Accessibility: Yes Additional Comments: per PT note. Pt states she needs a BSC because the one she has in unsteady.  Lives With: Spouse Prior Function Level of Independence: Needs assistance with ADLs;Needs assistance with gait;Requires assistive device for independence Vision/Perception  Vision - History Ability to See in Adequate Light: 1 Impaired Praxis Praxis: WFL  Cognition Overall Cognitive Status: Within Functional Limits for tasks assessed Arousal/Alertness: Awake/alert Orientation Level: Oriented X4 Sensation Sensation Light Touch: Appears Intact Light Touch Impaired Details: Impaired LLE;Impaired RLE Hot/Cold: Appears Intact Proprioception: Appears Intact Proprioception Impaired Details:  Impaired LLE Stereognosis: Not tested Coordination Gross Motor Movements are Fluid and Coordinated: No Fine Motor Movements are Fluid and Coordinated: No Motor  Motor Motor: Abnormal postural alignment and control Motor - Skilled Clinical Observations: acute pain in hips and back   Trunk/Postural Assessment  Cervical Assessment Cervical Assessment: Within Functional Limits Thoracic Assessment Thoracic Assessment: Within Functional Limits Lumbar Assessment Lumbar Assessment: Exceptions to [facility] Lumbar AROM Overall Lumbar AROM: Due to pain Lumbar Strength Overall Lumbar Strength: Due to pain Postural Control Postural Control: Within Functional Limits Trunk Control: pt able to sit EOB with RW for UE support >5'  Balance Balance Balance Assessed: Yes Static Sitting Balance Static Sitting - Balance Support: Feet supported;Bilateral upper extremity supported Static Sitting - Level of Assistance: 5: Stand by assistance Static Sitting - Comment/# of Minutes: pt requires UE support on RW wihen seated Dynamic Sitting Balance Dynamic Sitting - Balance Support: Feet supported;Right upper extremity supported;Left upper extremity supported Dynamic Sitting - Level of Assistance: 4: Min assist Dynamic Sitting Balance - Compensations: UE support needed due to back pain Dynamic Sitting - Balance Activities: Reaching for weighted objects Sitting balance - Comments: sitting EOB Static Standing Balance Static Standing - Balance Support: During functional activity;Bilateral upper extremity supported Static Standing - Level of Assistance: 4: Min assist Static Standing - Comment/# of Minutes: ~2' before resting due to fatigue, SOB Dynamic Standing Balance Dynamic Standing - Balance Support: Bilateral upper extremity supported;During functional activity Dynamic Standing - Level of Assistance: 4: Min assist Extremity Assessment      RLE Assessment RLE Assessment: Exceptions to [facility] RLE  Strength RLE Overall Strength: Deficits Right Hip Flexion: 4-/5 Right Hip Extension: 4-/5 Right Hip ABduction: 4-/5 Right Hip ADduction: 4-/5 Right Knee Flexion: 3+/5 Right Knee Extension: 3+/5 Right Ankle Dorsiflexion: 3+/5 Right Ankle Plantar Flexion: 4-/5 LLE Assessment LLE Assessment: Exceptions to WFL LLE Strength LLE Overall Strength: Due to pain Left Hip Flexion: 3+/5 Left Hip Extension: 3+/5 Left Hip ABduction: 4-/5 Left Hip ADduction: 4-/5 Left Knee Flexion: 3+/5 Left Knee Extension: 3+/5 Left Ankle Dorsiflexion: 4-/5 Left Ankle Plantar Flexion: 4-/5  Care  Tool Care Tool Bed Mobility Roll left and right activity   Roll left and right assist level: Minimal Assistance - Patient > 75%    Sit to lying activity   Sit to lying assist level: Moderate Assistance - Patient 50 - 74%    Lying to sitting on side of bed activity   Lying to sitting on side of bed assist level: the ability to move from lying on the back to sitting on the side of the bed with no back support.: Moderate Assistance - Patient 50 - 74%     Care Tool Transfers Sit to stand transfer   Sit to stand assist level: Moderate Assistance - Patient 50 - 74%    Chair/bed transfer   Chair/bed transfer assist level: Moderate Assistance - Patient 50 - 74%    Car transfer      Stair activity did not occur: Safety/medical concerns    Care Tool Locomotion Ambulation   Assist level: Moderate Assistance - Patient 50 - 74% Assistive device: Walker-rolling Max distance: x3'  Walk 10 feet activity Walk 10 feet activity did not occur: Safety/medical concerns       Walk 50 feet with 2 turns activity Walk 50 feet with 2 turns activity did not occur: Safety/medical concerns      Walk 150 feet activity Walk 150 feet activity did not occur: Safety/medical concerns      Walk 10 feet on uneven surfaces activity Walk 10 feet on uneven surfaces activity did not occur: Safety/medical concerns      Stairs Stair  activity did not occur: Safety/medical concerns        Walk up/down 1 step activity    Stair activity did not occur: Safety/medical concerns    Walk up/down 4 steps activity      Stair activity did not occur: Safety/medical concerns  Walk up/down 12 steps activity    Stair activity did not occur: Safety/medical concerns    Pick up small objects from floor  Stair activity did not occur: Safety/medical concerns      Wheelchair Is the patient using a wheelchair?: Yes Type of Wheelchair: Manual Wheelchair activity did not occur: Safety/medical concerns      Wheel 50 feet with 2 turns activity Wheelchair 50 feet with 2 turns activity did not occur: Safety/medical concerns    Wheel 150 feet activity Wheelchair 150 feet activity did not occur: Safety/medical concerns      Refer to Care Plan for Long Term Goals  SHORT TERM GOAL WEEK 1 PT Short Term Goal 1 (Week 1): Pt will complete rolling in bed with supervision PT Short Term Goal 2 (Week 1): Pt will complete supine<->sit with CGA. PT Short Term Goal 3 (Week 1): Pt will complete sit to stand with CGA + LRAD PT Short Term Goal 4 (Week 1): Pt will maintain dynamic standing balance with minA + LRAD  Recommendations for other services: Neuropsych  Skilled Therapeutic Intervention Mobility Bed Mobility Bed Mobility: Rolling Right;Rolling Left;Supine to Sit;Sitting - Scoot to Edge of Bed;Sit to Supine;Sit to Sidelying Right;Scooting to [facility];Right Sidelying to Sit Rolling Right: Independent with assistive device Rolling Left: Independent with assistive device Right Sidelying to Sit: Minimal Assistance - Patient > 75% Supine to Sit: Moderate Assistance - Patient 50-74% Sitting - Scoot to Edge of Bed: Contact Guard/Touching assist Sit to Supine: Minimal Assistance - Patient > 75% Sit to Sidelying Right: Minimal Assistance - Patient > 75% Scooting to HOB: Moderate Assistance - Patient 50-74% Transfers Transfers: Sit  to Stand;Stand to  Sit Sit to Stand: Moderate Assistance - Patient 50-74% Stand to Sit: Moderate Assistance - Patient 50-74% Stand Pivot Transfers: Moderate Assistance - Patient 50 - 74% Transfer (Assistive device): Rolling walker Locomotion  Gait Ambulation: Yes Gait Assistance: Minimal Assistance - Patient > 75% Gait Distance (Feet): 3 Feet Assistive device: Rolling walker Gait Assistance Details: Verbal cues for precautions/safety Gait Assistance Details: assistance for safety/falls prevention Gait Gait: Yes Gait Pattern: Impaired Gait Pattern: Shuffle;Antalgic;Wide base of support Gait velocity: slow Stairs / Additional Locomotion Stairs: No (due to safety/fall risk) Wheelchair Mobility Wheelchair Mobility: Yes (due to safety/fall risk)  Evaluation completed (see details above and below) with education on PT POC and goals and individual treatment initiated with focus on  strengthening, balance, transfers, endurance. Pt in bed when PT arrived.  Patient currently requires min/mod assist for transfers, min assist with ambulation with RW secondary to muscle weakness, muscle joint tightness, pain, decreased cardiorespiratoy endurance, decreased oxygen  support and decreased standing balance strategies.  Prior to hospitalization, patient was modified independent with mobility and lives with Spouse in a House home.  Home access is 1 step entrance. Pt appears anxious during session and focusing on SPO2 fluctuation stating, something is wrong! when SPO2 drops temporarily (with exertion).  Pt requires moderate encouragement /reassurance throughout session, cueing to breath, relaxation techniques.  Pt limited with standing due to cardiorespiratory deficits, generalized weakness and pain. Pt able to take a few steps ~3' with RW then needed to rest immediately, BSC was placed for pt to rest. Pt recovered and able to take steps backwards towards bed and lower herself EOB. Sit->supine supervision, pt able to roll side to  side using railing with supervision and once in bed pt needed to be repositioned to [facility] x2 with pt assisting with LE pushing.  Pt able to perform LE ex's including heel slides, hip abd, quad sets, glute sets, ankle pumps.  Pt left in semi reclined position with family arriving.  Pt thankful post evaluation and states she wants to do her best.      Discharge Criteria: Patient will be discharged from PT if patient refuses treatment 3 consecutive times without medical reason, if treatment goals not met, if there is a change in medical status, if patient makes no progress towards goals or if patient is discharged from hospital.  The above assessment, treatment plan, treatment alternatives and goals were discussed and mutually agreed upon: by patient  Yong Henle 02/10/2024, 8:27 AM

## 2024-02-10 NOTE — Discharge Summary (Addendum)
 Physician Discharge Summary   Patient: Kathleen [name] MRN: [id number] DOB: [birth year]  Admit date:     01/25/2024  Discharge date: 02/06/2024  Discharge Physician: Joette Mustard   PCP: Bennet Brasil, MD   Recommendations at discharge:    F/u pulmonologist regarding co2 retention Recheck CBC 2 weeks and make sure tolerating oral iron . Repeat iron  stores in 4 weeks F/u with pcp regarding alendronate  therapy Recheck potassium and magnesium  while in inpatient rehab  Discharge Diagnoses: Principal Problem:   Lower back pain Active Problems:   Interstitial lung disease (HCC)   Chronic respiratory failure with hypoxia (HCC)   Closed compression fracture of L3 lumbar vertebra, initial encounter (HCC)   Obstructive sleep apnea   Avascular necrosis of bone of hip, left (HCC)   Hypothyroidism   GERD (gastroesophageal reflux disease)   Pulmonary HTN (HCC)   Adrenal insufficiency (Addison's disease) (HCC)   Uncontrolled pain   Intractable low back pain   Spinal compression fracture (HCC)  Resolved Problems:   * No resolved hospital problems. *  Hospital Course: Kathleen [name] is an 63 y.o. female past medical history of interstitial lung disease, fsarcoidosis, chronic respiratory failure with hypoxia on 5 L of oxygen  morbid obesity Addison's disease bilateral hip avascular necrosis lumbar back pain and lumbar compression fracture comes back to the hospital with worsening lumbar and pelvic pain after recently being discharged on 01/23/2024 for L1 compression fracture she followed up with Dr. [name] after recent hospitalization, as an outpatient Dr. [name] was scheduling her for kyphoplasty after pulmonary clearance, but returns due to intractable back pain she was found to have acute left pubic symphysis and right sacral alar fracture initially admitted to [facility] after discussion with IR was transferred to [facility] for L3 and L4 kyphoplasty.  IR recommended kyphoplasty after  clearing from anesthesia team at [facility].Patient has a follow-up appointment with endocrinologist in 2 months.   Assessment and Plan: Intractable acute on chronic Lower back pain due to  drug induced osteoporotic fracture  involving L1, L3 and L4/acute left pubic symphysis and right sacral alar fracture: Interventional radiology c/s, and patient did not want to proceed with kyphoplasty. Continued with conservative management with narcotics and TLSO. She will go to inpatient rehab.  May consider kyphoplasty in the future.  Start alendronate   Sarcoidosis/interstitial lung disease (HCC)/Chronic respiratory failure with hypoxia (HCC) Chronic respiratory failure hypercapnia Compensatory metabolic alkalosis On 4 L of oxygen  and hydrocortisone . At risk of CO2 retention. Will need close follow up with Pulmonologist    HFpEF Patient on furosemide . No recent sleep study. Unable to tolerate spironolactone given low blood pressures.  Patient could benefit from sleep study in the outpatient setting and consideration of initiating SGLT 2 inh therapy.  Left femoral head avascular necrosis not a surgical candidate per ortho: In the setting of chronic steroid use Continue supportive care.   E. coli UTI: S/p 3-day course of IV Rocephin . No symptoms on discharge  Thrombocytosis  Likely due to Fe deficiency anemia   Mild hypokalemia  Supplementation PRN   Normocytic anemia: With a borderline MCV of 96 and RDW of 16. B12 and folate WNL Iron /TIBC/Ferritin/ %Sat Labs (Brief)          Component Value Date/Time    IRON  39 01/26/2024 1106    IRON  58 01/13/2016 1038    TIBC 308 01/26/2024 1106    TIBC 286 01/13/2016 1038    FERRITIN 19 01/26/2024 1106    FERRITIN 109  01/13/2016 1038    IRONPCTSAT 13 01/26/2024 1106    IRONPCTSAT 20 01/13/2016 1038    IRONPCTSAT 12 (L) 09/01/2014 1334    Continue Fe supplementation and bowel regimen. On stool softeners and linzess .   Vitamin D   deficiency  Vit D level 9.65 01/22/24. Continue PO supplementation    Addison's disease: Continue hydrocortisone .   Chronic hypotension: Continue midodrine .  Hyperlipidemia: Continue statins    Hypothyroidism: Continue Synthroid .   Urinary retention: Continue Flomax . Foley in place since 6/3.  TOV    Acute on chronic pain syndrome: Continue current regimen.   Anxiety and depression: Continue buspirone  Cymbalta  and Atarax .  Nonsevere malnutrition   Protein calorie malnutrition Albumin 2.8 and total protein 5.5   Morbid obesity: She has been counseled. Estimated body mass index is 39.75 kg/m as calculated from the following:   Height as of this encounter: 5' 10 (1.778 m).   Weight as of this encounter: 125.6 kg.    Pain control - [place]  Controlled Substance Reporting System database was reviewed. and patient was instructed, not to drive, operate heavy machinery, perform activities at heights, swimming or participation in water  activities or provide baby-sitting services while on Pain, Sleep and Anxiety Medications; until their outpatient Physician has advised to do so again. Also recommended to not to take more than prescribed Pain, Sleep and Anxiety Medications.  Consultants: IR Procedures performed: None  Disposition: Inpatient rehab Diet recommendation:  Discharge Diet Orders (From admission, onward)     Start     Ordered   02/06/24 0000  Diet - low sodium heart healthy        02/06/24 1051           Reg diet  DISCHARGE MEDICATION: Allergies as of 02/06/2024       Reactions   Bee Venom Anaphylaxis   Contrast Media [iodinated Contrast Media] Other (See Comments)   CT chest w/ contrast at OSH followed by SOB resolved w/ single dose steroids, never ENT swelling, intubation or pressors, has had multiple contrasted scans since   Morphine  Other (See Comments)   Substance with morphinan structure and opioid receptor agonist mechanism of action  (substance)   Bactrim  [sulfamethoxazole -trimethoprim ] Nausea And Vomiting   Codeine Itching        Medication List     STOP taking these medications    calcitonin (salmon) 200 UNIT/ACT nasal spray Commonly known as: MIACALCIN /FORTICAL       TAKE these medications    Acetaminophen  Extra Strength 500 MG Tabs Take 2 tablets (1,000 mg total) by mouth 3 (three) times daily.   albuterol  (2.5 MG/3ML) 0.083% nebulizer solution Commonly known as: PROVENTIL  Inhale 3 mL (2.5 mg total) by nebulization every four (4) hours as needed for wheezing or shortness of breath.   alendronate  70 MG tablet Commonly known as: Fosamax  Take 1 tablet (70 mg total) by mouth every 7 (seven) days. Take with a full glass of water  on an empty stomach.   budesonide  0.5 MG/2ML nebulizer solution Commonly known as: PULMICORT  Inhale the contents of 1 vial via nebulizer two (2) times a day.   busPIRone  15 MG tablet Commonly known as: BUSPAR  Take 1 tablet (15 mg) by mouth 3 times daily.   cyanocobalamin  1000 MCG/ML injection Commonly known as: VITAMIN B12 Inject 1 mL (1,000 mcg total) into the skin daily.   cyclobenzaprine  5 MG tablet Commonly known as: FLEXERIL  Take 1 tablet (5 mg total) by mouth 3 (three) times daily.   DULoxetine  60  MG capsule Commonly known as: CYMBALTA  Take 1 capsule (60 mg total) by mouth daily.   EPINEPHrine  0.3 mg/0.3 mL Soaj injection Commonly known as: EPI-PEN Inject 0.3 mg into the muscle as needed for anaphylaxis.   ferrous sulfate  325 (65 FE) MG EC tablet Take 325 mg by mouth once a week.   folic acid  1 MG tablet Commonly known as: FOLVITE  Take 1 tablet (1 mg total) by mouth daily.   furosemide  20 MG tablet Commonly known as: LASIX  Take 1 tablet (20 mg total) by mouth every Monday, Wednesday, and Friday.   hydrocortisone  10 MG tablet Commonly known as: CORTEF  Take 2 tablets by mouth at 8:00 am and 1 tablet at lunch. (May double dose on sick days x 3-5  days)   HYDROmorphone  1 MG/ML injection Commonly known as: DILAUDID  Inject 1 mL (1 mg total) into the vein every 3 (three) hours as needed (Severe pain not relieved by oral therapy).   hydrOXYzine  25 MG tablet Commonly known as: ATARAX  Take 1 tablet (25 mg total) by mouth 3 (three) times daily as needed for anxiety.   ipratropium-albuterol  0.5-2.5 (3) MG/3ML Soln Commonly known as: DUONEB Take 3 mLs by nebulization every 8 (eight) hours.   lactulose  10 GM/15ML solution Commonly known as: CHRONULAC  Take 15 mLs (10 g total) by mouth daily as needed for mild constipation.   levothyroxine  50 MCG tablet Commonly known as: SYNTHROID  Take 1 tablet by mouth daily before breakfast.   lidocaine  5 % Commonly known as: LIDODERM  Place 1 patch onto the skin daily. Remove & Discard patch within 12 hours or as directed by MD   Linzess  72 MCG capsule Generic drug: linaclotide  Take 1 capsule (72 mcg total) by mouth daily before breakfast.   midodrine  5 MG tablet Commonly known as: PROAMATINE  Take 1 tablet (5 mg total) by mouth 2 (two) times daily with a meal.   ondansetron  8 MG disintegrating tablet Commonly known as: ZOFRAN -ODT Dissolve 1 tablet (8 mg total) by mouth every 8 (eight) hours as needed. What changed: reasons to take this   oxycodone  30 MG immediate release tablet Commonly known as: ROXICODONE  Take 1 tablet (30 mg total) by mouth every 4 (four) hours as needed for moderate pain (pain score 4-6) or severe pain (pain score 7-10). What changed:  medication strength how much to take   OXYGEN  Inhale 5 L into the lungs continuous.   pantoprazole  40 MG tablet Commonly known as: PROTONIX  Take 1 tablet (40 mg total) by mouth daily.   polyethylene glycol 17 g packet Commonly known as: MIRALAX  / GLYCOLAX  Take 17 g by mouth 2 (two) times daily.   rosuvastatin  20 MG tablet Commonly known as: Crestor  Take 1 tablet (20 mg total) by mouth daily.   senna-docusate 8.6-50 MG  tablet Commonly known as: Senokot-S Take 2 tablets by mouth at bedtime.   tamsulosin  0.4 MG Caps capsule Commonly known as: FLOMAX  Take 1 capsule (0.4 mg total) by mouth daily after supper.   Vitamin D  (Ergocalciferol ) 1.25 MG (50000 UNIT) Caps capsule Commonly known as: DRISDOL  Take 1 capsule (50,000 Units total) by mouth every Sunday.        Follow-up Information     [facility] Follow up.   Why: HHPT will call to reschedule first home visit after you are DC.               Discharge Exam: Filed Weights   01/25/24 1908  Weight: 125.6 kg   Physical Exam  Constitutional: In no distress.  Cardiovascular: Normal rate, regular rhythm. No lower extremity edema  Pulmonary: Non labored breathing on [place], no wheezing or rales.  Abdominal: Soft. Non distended and non tender     Neurological: Alert and oriented to person, place, and time. Non focal  Skin: Skin is warm and dry.    Condition at discharge: stable  The results of significant diagnostics from this hospitalization (including imaging, microbiology, ancillary and laboratory) are listed below for reference.   Imaging Studies: DG Abd 1 View Result Date: 02/09/2024 CLINICAL DATA:  [id number] [facility]) [id number] EXAM: ABDOMEN - 1 VIEW COMPARISON:  Jan 25, 2024 FINDINGS: Evaluation is limited by under penetration. Air and stool filled loops of bowel. No discretely dilated loops of bowel are visualized. Air is visualized in the rectum. Moderate colonic stool burden. Patchy bibasilar opacities. Degenerative changes of the thoracolumbar spine with a similar compression fracture deformity of the lower thoracic spine. Status post cholecystectomy. IMPRESSION: 1. No discretely dilated loops of bowel are visualized within the limitations of the exam. 2. Patchy bibasilar opacities, possibly atelectasis. Electronically Signed   By: Clancy Crimes M.D.   On: 02/09/2024 09:06   VAS US  LOWER EXTREMITY VENOUS (DVT) Result Date:  02/08/2024  Lower Venous DVT Study Patient Name:  VERTIS BAUDER  Date of Exam:   02/07/2024 Medical Rec #: [id number]       Accession #:    [id number] Date of Birth: 02-23-1961       Patient Gender: F Patient Age:   65 years Exam Location:  [facility] Procedure:      VAS US  LOWER EXTREMITY VENOUS (DVT) Referring Phys: Georjean Kite --------------------------------------------------------------------------------  Indications: Pain, and Swelling. Other Indications: Rehab patient. Limitations: Body habitus, poor ultrasound/tissue interface and pain intolerance. Comparison Study: Previous exam on 02/26/2023 was negative for DVT. Performing Technologist: Arlyce Berger RVT, RDMS  Examination Guidelines: A complete evaluation includes B-mode imaging, spectral Doppler, color Doppler, and power Doppler as needed of all accessible portions of each vessel. Bilateral testing is considered an integral part of a complete examination. Limited examinations for reoccurring indications may be performed as noted. The reflux portion of the exam is performed with the patient in reverse Trendelenburg.  +---------+---------------+---------+-----------+----------+-------------------+ RIGHT    CompressibilityPhasicitySpontaneityPropertiesThrombus Aging      +---------+---------------+---------+-----------+----------+-------------------+ CFV      Full           Yes      Yes                                      +---------+---------------+---------+-----------+----------+-------------------+ SFJ      Full                                                             +---------+---------------+---------+-----------+----------+-------------------+ FV Prox  Full           Yes      Yes                                      +---------+---------------+---------+-----------+----------+-------------------+ FV Mid   Full  Yes      Yes                                       +---------+---------------+---------+-----------+----------+-------------------+ FV DistalFull           Yes      Yes                                      +---------+---------------+---------+-----------+----------+-------------------+ PFV                     Yes      Yes                                      +---------+---------------+---------+-----------+----------+-------------------+ POP      Full           Yes      Yes                                      +---------+---------------+---------+-----------+----------+-------------------+ PTV      Full                                                             +---------+---------------+---------+-----------+----------+-------------------+ PERO                                                  Not well visualized +---------+---------------+---------+-----------+----------+-------------------+   +---------+---------------+---------+-----------+----------+-------------------+ LEFT     CompressibilityPhasicitySpontaneityPropertiesThrombus Aging      +---------+---------------+---------+-----------+----------+-------------------+ CFV      Full           Yes      Yes                                      +---------+---------------+---------+-----------+----------+-------------------+ SFJ      Full                                                             +---------+---------------+---------+-----------+----------+-------------------+ FV Prox  Full           Yes      Yes                                      +---------+---------------+---------+-----------+----------+-------------------+ FV Mid   Full           Yes      Yes                                      +---------+---------------+---------+-----------+----------+-------------------+  FV DistalFull           Yes      Yes                                      +---------+---------------+---------+-----------+----------+-------------------+  PFV      Full                                                             +---------+---------------+---------+-----------+----------+-------------------+ POP      Full           Yes      Yes                                      +---------+---------------+---------+-----------+----------+-------------------+ PTV                                                   Not well visualized +---------+---------------+---------+-----------+----------+-------------------+ PERO     Full                                                             +---------+---------------+---------+-----------+----------+-------------------+     Summary: BILATERAL: - No evidence of deep vein thrombosis seen in the lower extremities, bilaterally. -No evidence of popliteal cyst, bilaterally.   *See table(s) above for measurements and observations. Electronically signed by Genny Kid MD on 02/08/2024 at 10:56:51 AM.    Final    CT PELVIS WO CONTRAST Result Date: 01/25/2024 CLINICAL DATA:  Pelvic pain, chronic steroid use, lumbar compression fractures EXAM: CT PELVIS WITHOUT CONTRAST TECHNIQUE: Multidetector CT imaging of the pelvis was performed following the standard protocol without intravenous contrast. RADIATION DOSE REDUCTION: This exam was performed according to the departmental dose-optimization program which includes automated exposure control, adjustment of the mA and/or kV according to patient size and/or use of iterative reconstruction technique. COMPARISON:  None Available. FINDINGS: Urinary Tract:  No abnormality visualized. Bowel:  Unremarkable visualized pelvic bowel loops. Vascular/Lymphatic: No pathologically enlarged lymph nodes. No significant vascular abnormality seen. Reproductive: The pelvic organs are unremarkable. No free intraperitoneal fluid within the pelvis. Other:  No abdominal wall hernia Musculoskeletal: Acute left pubic symphyseal and right sacral alar compression fractures are  identified in keeping with a lateral compression type 1 injury. The sacroiliac joint spaces are preserved. Pubic symphyseal joint space is preserved. No other fracture identified. No dislocation. Avascular necrosis of the left femoral head noted with mild articular collapse. Joint space appears preserved, however, mild degenerative arthritis is present with tiny osteophyte formation noted. L4 superior endplate fracture noted. IMPRESSION: 1. Acute left pubic symphyseal and right sacral alar compression fractures in keeping with a lateral compression type 1 injury. 2. L4 superior endplate fracture. 3. Avascular necrosis of the left femoral head with mild articular collapse. Electronically Signed   By: Kristene Petrin  Jackquelyn Mass M.D.   On: 01/25/2024 23:25   CT Lumbar Spine Wo Contrast Result Date: 01/25/2024 CLINICAL DATA:  Low back pain, constant or radicular pain, no prior imaging (Ped 0-17y). Chronic steroid use, compression fracture. EXAM: CT LUMBAR SPINE WITHOUT CONTRAST TECHNIQUE: Multidetector CT imaging of the lumbar spine was performed without intravenous contrast administration. Multiplanar CT image reconstructions were also generated. RADIATION DOSE REDUCTION: This exam was performed according to the departmental dose-optimization program which includes automated exposure control, adjustment of the mA and/or kV according to patient size and/or use of iterative reconstruction technique. COMPARISON:  01/21/2024 FINDINGS: Segmentation: 5 lumbar type vertebrae. Alignment: Normal. Vertebrae: Superior endplate fracture of L1 is again seen and appears stable with proximally 10-15% loss of height and no retropulsion. This is new since MRI examination of 01/03/2024 and is acute to subacute in nature. Stable superior endplate fractures of L3 and L4 are also no 25-30% loss of height and minimal (2-3 mm) retropulsion of the posterosuperior aspect of the vertebral bodies without significant associated canal stenosis. No interval  fracture since prior examination. Remaining vertebral body height is preserved. The osseous structures are diffusely osteopenic. Paraspinal and other soft tissues: Negative. Disc levels: Intervertebral disc heights are preserved. Vacuum disc phenomena noted at L2-3. No significant canal stenosis. No significant neuroforaminal narrowing. IMPRESSION: 1. Stable superior endplate fractures of L1, L3 and L4 as described above. 2. No interval fracture since prior examination. 3. No significant canal stenosis or neuroforaminal narrowing. Electronically Signed   By: Worthy Heads M.D.   On: 01/25/2024 23:08   DG Lumbar Spine Complete Result Date: 01/25/2024 CLINICAL DATA:  Compression fracture, chronic steroid use, low back pain EXAM: LUMBAR SPINE - COMPLETE 4+ VIEW COMPARISON:  04/07/2020 FINDINGS: Interval development of superior endplate fractures of L1 with minimal loss of height. Stable superior endplate fractures of L3 and L4, acute to subacute and better assessed on prior MRI examination of 01/03/2024. No listhesis. Intervertebral disc heights are preserved. Paraspinal soft tissues are unremarkable. IMPRESSION: 1. Interval development of acute superior endplate fractures of L1 with minimal loss of height since prior MRI examination. 2. Stable superior endplate fractures L3 and L4, better assessed on MRI examination of 01/03/2024 four views are seen to be acute-subacute. Electronically Signed   By: Worthy Heads M.D.   On: 01/25/2024 21:04   CT Lumbar Spine Wo Contrast Result Date: 01/21/2024 CLINICAL DATA:  Lumbar compression fracture EXAM: CT LUMBAR SPINE WITHOUT CONTRAST TECHNIQUE: Multidetector CT imaging of the lumbar spine was performed without intravenous contrast administration. Multiplanar CT image reconstructions were also generated. RADIATION DOSE REDUCTION: This exam was performed according to the departmental dose-optimization program which includes automated exposure control, adjustment of the mA  and/or kV according to patient size and/or use of iterative reconstruction technique. COMPARISON:  CT lumbar spine 12/18/2022 an MRI lumbar spine 01/03/2024 FINDINGS: Segmentation: 5 lumbar type vertebrae. Alignment: Normal. Vertebrae: There are chronic compression deformities of L3 and L4, unchanged since 01/03/2024. There is a new, mild wedge compression fracture of the L1 superior endplate with approximately 10% height loss. Paraspinal and other soft tissues: Calcific aortic atherosclerosis. Disc levels: No spinal canal stenosis. IMPRESSION: 1. New, mild wedge compression fracture of the L1 superior endplate with approximately 10% height loss. 2. Chronic compression deformities of L3 and L4, unchanged since 01/03/2024. Aortic Atherosclerosis (ICD10-I70.0). Electronically Signed   By: Juanetta Nordmann M.D.   On: 01/21/2024 01:26   CT Chest Wo Contrast Result Date: 01/21/2024 CLINICAL DATA:  Pneumonia, complication  suspected, xray done Pneumonia vs edema vs sarcoidosis on CXR EXAM: CT CHEST WITHOUT CONTRAST TECHNIQUE: Multidetector CT imaging of the chest was performed following the standard protocol without IV contrast. RADIATION DOSE REDUCTION: This exam was performed according to the departmental dose-optimization program which includes automated exposure control, adjustment of the mA and/or kV according to patient size and/or use of iterative reconstruction technique. COMPARISON:  Chest x-ray 01/20/2024, CT angio chest 08/05/2023, CT angio chest 09/11/2012 FINDINGS: Cardiovascular: Normal heart size. No significant pericardial effusion. The thoracic aorta is normal in caliber. Mild atherosclerotic plaque of the thoracic aorta. No coronary artery calcifications. Mitral annular calcification Mediastinum/Nodes: No gross hilar adenopathy, noting limited sensitivity for the detection of hilar adenopathy on this noncontrast study. No enlarged mediastinal or axillary lymph nodes. Thyroid  gland, trachea, and esophagus  demonstrate no significant findings. Lungs/Pleura: Similar-appearing chronic bilateral upper lobe paramediastinal architectural distortion, bronchiectasis, peribronchovascular consolidation consistent with scarring. No focal consolidation. No pulmonary nodule. No pulmonary mass. No pleural effusion. No pneumothorax. Upper Abdomen: Status post cholecystectomy. Hepatic steatosis. Atrophic pancreas. Musculoskeletal: No chest wall abnormality. No suspicious lytic or blastic osseous lesions. No acute displaced fracture. Chronic T11 superior endplate compression fracture. IMPRESSION: 1. No acute intrathoracic abnormality. 2. Similar-appearing chronic bilateral upper lobe paramediastinal architectural distortion, bronchiectasis, peribronchovascular consolidation consistent with scarring. 3. Hepatic steatosis. 4. Aortic Atherosclerosis (ICD10-I70.0) including mitral annular calcification. Electronically Signed   By: Morgane  Naveau M.D.   On: 01/21/2024 00:44   DG Chest Port 1 View Result Date: 01/20/2024 CLINICAL DATA:  Chest pain and difficulty breathing. EXAM: PORTABLE CHEST 1 VIEW COMPARISON:  Jan 03, 2024 FINDINGS: The cardiac silhouette is enlarged and unchanged in size. There is moderate to marked severity prominence of the pulmonary vasculature, increased in severity when compared to the prior study. Moderate to marked severity diffusely increased interstitial lung markings are also seen. There is moderate to marked severity airspace disease seen within the periphery of the mid to lower left lung. This is limited in evaluation secondary to overlying breast attenuation artifact. Biapical pleural thickening is noted. A small to moderate size left pleural effusion is also suspected. No pneumothorax is identified. Multilevel degenerative changes seen throughout the thoracic spine. IMPRESSION: 1. Cardiomegaly with moderate to marked severity pulmonary vascular congestion and interstitial edema. 2. Moderate to marked  severity airspace disease within the periphery of the mid to lower left lung, which may represent asymmetric pulmonary edema versus pneumonia. Further evaluation with nonemergent chest CT is recommended to further exclude the presence of an underlying neoplastic process. 3. Small to moderate size left pleural effusion. Electronically Signed   By: Virgle Grime M.D.   On: 01/20/2024 23:38    Microbiology: Results for orders placed or performed during the hospital encounter of 01/25/24  Urine Culture     Status: Abnormal   Collection Time: 01/25/24 11:08 PM   Specimen: Urine, Clean Catch  Result Value Ref Range Status   Specimen Description   Final    URINE, CLEAN CATCH Performed at [facility], [address], [place], [place] [address]    Special Requests   Final    NONE Performed at [facility], [address], [place], [place] [address]    Culture >=100,000 COLONIES/mL ESCHERICHIA COLI (A)  Final   Report Status 01/28/2024 FINAL  Final   Organism ID, Bacteria ESCHERICHIA COLI (A)  Final      Susceptibility   Escherichia coli - MIC*    AMPICILLIN 8 SENSITIVE Sensitive  CEFAZOLIN  <=4 SENSITIVE Sensitive     CEFEPIME  <=0.12 SENSITIVE Sensitive     CEFTRIAXONE  <=0.25 SENSITIVE Sensitive     CIPROFLOXACIN  <=0.25 SENSITIVE Sensitive     GENTAMICIN <=1 SENSITIVE Sensitive     IMIPENEM <=0.25 SENSITIVE Sensitive     NITROFURANTOIN <=16 SENSITIVE Sensitive     TRIMETH /SULFA  <=20 SENSITIVE Sensitive     AMPICILLIN/SULBACTAM <=2 SENSITIVE Sensitive     PIP/TAZO <=4 SENSITIVE Sensitive ug/mL    * >=100,000 COLONIES/mL ESCHERICHIA COLI   *Note: Due to a large number of results and/or encounters for the requested time period, some results have not been displayed. A complete set of results can be found in Results Review.    Labs: CBC: Recent Labs  Lab 02/06/24 1526 02/07/24 0735  WBC 7.4 7.4  NEUTROABS  --  5.2  HGB 8.5* 8.3*  HCT 29.7* 28.8*  MCV 97.7 98.0  PLT 432* 454*    Basic Metabolic Panel: Recent Labs  Lab 02/03/24 0549 02/06/24 0639 02/06/24 1526 02/07/24 0735  NA 139 139  --  142  K 3.7 3.6  --  3.4*  CL 95* 98  --  97*  CO2 35* 33*  --  36*  GLUCOSE 147* 125*  --  104*  BUN <5* <5*  --  <5*  CREATININE 0.90 0.88 0.96 0.90  CALCIUM  8.5* 8.3*  --  8.8*   Liver Function Tests: Recent Labs  Lab 02/07/24 0735  AST 23  ALT 25  ALKPHOS 89  BILITOT 0.6  PROT 5.4*  ALBUMIN 2.7*   CBG: Recent Labs  Lab 02/06/24 1641 02/06/24 2127  GLUCAP 141* 129*    Discharge time spent: greater than 30 minutes.  Signed: Joette Mustard, MD [facility] 02/10/2024

## 2024-02-11 DIAGNOSIS — F32A Depression, unspecified: Secondary | ICD-10-CM | POA: Diagnosis not present

## 2024-02-11 DIAGNOSIS — F419 Anxiety disorder, unspecified: Secondary | ICD-10-CM | POA: Diagnosis not present

## 2024-02-11 DIAGNOSIS — S129XXD Fracture of neck, unspecified, subsequent encounter: Secondary | ICD-10-CM | POA: Diagnosis not present

## 2024-02-11 DIAGNOSIS — J849 Interstitial pulmonary disease, unspecified: Secondary | ICD-10-CM | POA: Diagnosis not present

## 2024-02-11 DIAGNOSIS — S12090S Other displaced fracture of first cervical vertebra, sequela: Secondary | ICD-10-CM

## 2024-02-11 DIAGNOSIS — M545 Low back pain, unspecified: Secondary | ICD-10-CM | POA: Diagnosis not present

## 2024-02-11 DIAGNOSIS — K5900 Constipation, unspecified: Secondary | ICD-10-CM | POA: Diagnosis not present

## 2024-02-11 LAB — CBC
HCT: 29.2 % — ABNORMAL LOW (ref 36.0–46.0)
Hemoglobin: 8.5 g/dL — ABNORMAL LOW (ref 12.0–15.0)
MCH: 28.5 pg (ref 26.0–34.0)
MCHC: 29.1 g/dL — ABNORMAL LOW (ref 30.0–36.0)
MCV: 98 fL (ref 80.0–100.0)
Platelets: 472 10*3/uL — ABNORMAL HIGH (ref 150–400)
RBC: 2.98 MIL/uL — ABNORMAL LOW (ref 3.87–5.11)
RDW: 16.4 % — ABNORMAL HIGH (ref 11.5–15.5)
WBC: 8.6 10*3/uL (ref 4.0–10.5)
nRBC: 0.5 % — ABNORMAL HIGH (ref 0.0–0.2)

## 2024-02-11 LAB — BASIC METABOLIC PANEL WITH GFR
Anion gap: 8 (ref 5–15)
BUN: <5 mg/dL — ABNORMAL LOW (ref 8–23)
CO2: 33 mmol/L — ABNORMAL HIGH (ref 22–32)
Calcium: 8.7 mg/dL — ABNORMAL LOW (ref 8.9–10.3)
Chloride: 98 mmol/L (ref 98–111)
Creatinine, Ser: 1.04 mg/dL — ABNORMAL HIGH (ref 0.44–1.00)
GFR, Estimated: >60 mL/min (ref >60)
Glucose, Bld: 126 mg/dL — ABNORMAL HIGH (ref 70–99)
Potassium: 4.4 mmol/L (ref 3.5–5.1)
Sodium: 139 mmol/L (ref 135–145)

## 2024-02-11 MED ORDER — OXYCODONE HCL ER 10 MG PO T12A
20.0000 mg | EXTENDED_RELEASE_TABLET | Freq: Two times a day (BID) | ORAL | Status: DC
  Administered 2024-02-11 – 2024-02-24 (×26): 20 mg via ORAL
  Filled 2024-02-11 (×27): qty 2

## 2024-02-11 NOTE — Consult Note (Signed)
 Neuropsychological Consultation Comprehensive Inpatient Rehab   Patient:   Kathleen [name]   DOB:   10/28/1960  MR Number:  [id number]  Location:  [facility] [address] [place] [address] Dept: [phone] Loc: [phone]           Date of Service:   02/11/2024  Start Time:   1 PM End Time:   2 PM  Provider/Observer:  Chapman Commodore, Psy.D.       Clinical Neuropsychologist       Billing Code/Service: [phone]  Reason for Service:    Kathleen [name] is a 63 year old female referred for neuropsychological consultation due to coping issues with significant anxiety and depressive symptomatology and currently admitted to the comprehensive inpatient rehabilitation unit.  Patient has significant prior medical history including chronic hypoxic respiratory failure with interstitial lung disease/sarcoidosis and has been maintained on chronic prednisone  for many years.  Addison's disease, chronic hypotension, chronic anemia, chronic urinary incontinence, bilateral hip AVN, osteoporosis with multiple vertebral fractures and on chronic pain medications followed by [facility] pain clinic.  Patient recently admitted due to severe back pain resulting from fractures of L4 and then L3 and then readmitted for acute L1 fracture.  Patient was not viewed as a surgical candidate due to the status of her bone and their physical integrity likely from years of prednisone  use.  Patient's pulmonary status is quite impaired which greatly complicates any attempts at surgical intervention.  Today was a follow-up visit after I saw her the first time yesterday.  Today the patient was in the room along with her husband.  Patient's husband reports that the patient has essentially been in the bed for almost a year after suffering a number of fractured bones including spinal process fracture then ankle fracture and now vertebral fractures.  Patient has  been dealing with chronic pain and on significant pain meds and has been transferred from her primary care Dr. Fairy Homer who had been working with her for some time and now being followed by [facility] medical.  The patient has had 2 appointments with [facility] medical primary to manage her pain medications.  Patient had a specific question about whether she would continue to see them or follow-up with Dr. Lovvorn post discharge with regard to management of her pain and I stated that that would be something Dr. Raynaldo Call would determine closer to time of discharge or during follow-up outpatient visit she will have with Dr. Lovorn.  The patient continues to experience considerable anxiety but was in a better mood state today than yesterday.  The patient reports that she still struggles to sleep at night because of the pain.  She notes that she had a bowel movement today after needing enema yesterday.  Patient reports that her anxiety is generally been stable although it did appear somewhat better today possibly due to her husband in the room supporting her.  We continued to work on coping and adjustment issues with longstanding history of anxiety and depression with exacerbations of significant medical issues.  HPI for the current admission:    HPI: Kathleen [name] is a 63 year old right handed female with history of chronic hypoxic respiratory failure (5 L O2) interstitial lung disease followed by pulmonary services Dr. Jolyne Needs at [facility], sarcoidosis maintained on chronic prednisone , Addison's disease maintained on Cortef  followed by endocrinology services Dr.Gebreselassie Nida, chronic hypotension, chronic anemia, chronic urinary incontinence, class III obesity BMI 39.75, bilateral hip  AVN and not felt to be a surgical candidate, osteoporosis with multiple vertebral fractures on chronic narcotics followed at the [facility] pain clinic and recent admissions for intractable back pain due to compression fractures of L4 (4/12 -  4/16) and then L3 (5/9 - 5/14), readmitted 01/20/2024 with acute L1 fracture discharged to home 01/23/2024 with [facility]. Per chart review patient lives with spouse. 1 level home one-step to entry. Patient readmitted 01/25/2024 with increasing low back pain to [facility]. CT lumbar spine showed superior endplate fracture of L1 appearing stable with approximately 10 to 15% loss of height no retropulsion. Stable superior endplate fractures of L3 and L4 and also 25 to 30% loss of height and minimal retropulsion of the posterior superior aspect of the vertebral bodies without significant associated canal stenosis. CT of the pelvis showed acute left pubic symphyseal and right sacral ala compression fractures/L4 superior endplate fracture as well as avascular necrosis of the left femoral head with mild articular collapse. Admission chemistries unremarkable except chloride 94 CO2 30 glucose 145 alkaline phosphatase 129, hemoglobin 9.1, urine culture greater than 100,000 E. coli. Patient was transferred to [facility] due to increasing back pain. Interventional radiology consulted/Dr. Alvira Josephs and discussed at length kyphoplasty which patient declined.. TLSO back brace as directed. Hospital course patient did receive a 3-day course of intravenous ceftriaxone  for E. coli UTI 6/1 through 6/3. She was cleared to begin Lovenox  for DVT prophylaxis. Followed the distance by critical care medicine for pulmonary sarcoidosis/chronic hypoxic respiratory failure and currently on 4 L of oxygen  nasal cannula. Therapy evaluations completed due to patient's decreased functional mobility and intractable back pain was admitted for a comprehensive rehab program.   Medical History:   Past Medical History:  Diagnosis Date   Adrenal insufficiency (HCC)    Anemia    Anxiety    Avascular bone necrosis (HCC)    Breast fibrocystic disorder    Chronic pain    Encounter for long-term opiate analgesic use  02/02/2020   Patient has chronic pain with femoral head avascular necrosis.  Is under pain contract   Fibromyalgia    Gastroesophageal reflux disease    Hypothyroidism    Mitral valve prolapse    Orthostatic hypotension    Peripheral neuropathy    PONV (postoperative nausea and vomiting)    Restrictive lung disease    Moderate to severe   Sarcoidosis    Biopsy proven - [facility]   Sarcoidosis    SOB (shortness of breath)    chronic         Patient Active Problem List   Diagnosis Date Noted   Spinal compression fracture (HCC) 01/29/2024   Lower back pain 01/26/2024   Closed compression fracture of L3 lumbar vertebra, initial encounter (HCC) 01/21/2024   Intractable low back pain 01/03/2024   Ambulatory dysfunction 12/11/2023   Uncontrolled pain 12/08/2023   Pressure injury of skin 12/08/2023   Intractable back pain 12/07/2023   Prediabetes 10/28/2023   Ankle fracture, bimalleolar, closed, left, sequela 03/10/2023   Closed fracture of left ankle 03/10/2023   Syndesmotic disruption of ankle, left, initial encounter 03/10/2023   Lumbar transverse process fracture (HCC) 01/02/2023   Acute on chronic hypoxic respiratory failure (HCC) 12/13/2022   Depression, major, single episode, moderate (HCC) 09/10/2022   Aortic atherosclerosis (HCC) 09/10/2022   Chronic respiratory failure with hypoxia (HCC) 05/22/2021   Anxiety and depression 05/22/2021   Vitamin D  deficiency 05/09/2021   Left foot drop 01/09/2021  Pulmonary HTN (HCC) 10/12/2020   Encounter for long-term opiate analgesic use 02/02/2020   Interstitial lung disease (HCC) 04/14/2019   GERD (gastroesophageal reflux disease) 10/15/2017   Personal history of noncompliance with medical treatment, presenting hazards to health 04/26/2017   Hypothyroidism 01/15/2017   Adrenal insufficiency (Addison's disease) (HCC) 08/22/2016   Symptomatic bradycardia    Adrenal insufficiency (HCC) 08/17/2016   Hypokalemia 08/17/2016   Chronic pain  syndrome 04/20/2016   Irritable bowel syndrome with constipation 11/14/2015   Avascular necrosis of bone of hip, left (HCC) 04/05/2015   Post herpetic neuralgia 10/22/2014   Mixed hyperlipidemia 08/25/2014   Obstructive sleep apnea 02/18/2014   Fibromyalgia 02/11/2013   Bilateral lower extremity edema 01/22/2013   Hypotension 01/16/2013   Fibrocystic breast disease in female 04/09/2012   Sarcoidosis 06/21/2008    Behavioral Observation/Mental Status:   EVEREST HACKING  presents as a 63 y.o.-year-old Right handed Caucasian Female who appeared her stated age. her dress was Appropriate and she was Well Groomed and her manners were Appropriate to the situation.  her participation was indicative of Appropriate behaviors.  There were physical disabilities noted.  she displayed an appropriate level of cooperation and motivation.    Interactions:    Active Appropriate  Attention:   abnormal and attention span appeared shorter than expected for age  Memory:   within normal limits; recent and remote memory intact  Visuo-spatial:   not examined  Speech (Volume):  low  Speech:   normal; normal  Thought Process:  Coherent and Relevant  Coherent and Linear  Though Content:  Rumination; not suicidal and not homicidal  Orientation:   person, place, time/date, and situation  Judgment:   Fair  Planning:   Fair  Affect:    Anxious  Mood:    Anxious  Insight:   Good  Intelligence:   normal  Psychiatric History:  Patient with significant past psychiatric history with anxiety and depressive symptoms.  Patient continuing with home medications including Cymbalta , BuSpar  and Celexa .  Patient has been followed for years by Charlotta Cook, MD and it appears that these medications for mood have been taking over by another physician.  Abuse/Trauma History: Patient has had considerable stressful and traumatic experiences through the years.   History of Substance Use or Abuse:  No concerns of  substance abuse are reported.  However, the patient is on long-term opiate based pain medications and is being followed by [facility] medical for pain management.  Family Med/Psych History:  Family History  Problem Relation Age of Onset   Cardiomyopathy Mother    Breast cancer Mother    Prostate cancer Father    Heart attack Maternal Grandmother    Heart attack Maternal Grandfather    Heart attack Paternal Grandmother    Bipolar disorder Daughter    Heart disease Maternal Aunt    Neurofibromatosis Cousin    Colon cancer Neg Hx     Impression/DX:   ASTRIA JORDAHL is a 63 year old female referred for neuropsychological consultation due to coping issues with significant anxiety and depressive symptomatology and currently admitted to the comprehensive inpatient rehabilitation unit.  Patient has significant prior medical history including chronic hypoxic respiratory failure with interstitial lung disease/sarcoidosis and has been maintained on chronic prednisone  for many years.  Addison's disease, chronic hypotension, chronic anemia, chronic urinary incontinence, bilateral hip AVN, osteoporosis with multiple vertebral fractures and on chronic pain medications followed by [facility] pain clinic.  Patient recently admitted due to severe back pain resulting from  fractures of L4 and then L3 and then readmitted for acute L1 fracture.  Patient was not viewed as a surgical candidate due to the status of her bone and their physical integrity likely from years of prednisone  use.  Patient's pulmonary status is quite impaired which greatly complicates any attempts at surgical intervention.  Today was a follow-up visit after I saw her the first time yesterday.  Today the patient was in the room along with her husband.  Patient's husband reports that the patient has essentially been in the bed for almost a year after suffering a number of fractured bones including spinal process fracture then ankle fracture and now  vertebral fractures.  Patient has been dealing with chronic pain and on significant pain meds and has been transferred from her primary care Dr. Fairy Homer who had been working with her for some time and now being followed by [facility] medical.  The patient has had 2 appointments with [facility] medical primary to manage her pain medications.  Patient had a specific question about whether she would continue to see them or follow-up with Dr. Lovvorn post discharge with regard to management of her pain and I stated that that would be something Dr. Raynaldo Call would determine closer to time of discharge or during follow-up outpatient visit she will have with Dr. Lovorn.  The patient continues to experience considerable anxiety but was in a better mood state today than yesterday.  The patient reports that she still struggles to sleep at night because of the pain.  She notes that she had a bowel movement today after needing enema yesterday.  Patient reports that her anxiety is generally been stable although it did appear somewhat better today possibly due to her husband in the room supporting her.  We continued to work on coping and adjustment issues with longstanding history of anxiety and depression with exacerbations of significant medical issues.  Disposition/Plan:  I will make myself available for follow-up postdischarge and will check on her again during her admission.  Diagnosis:    Anxiety with depression         Electronically Signed   _______________________ Chapman Commodore, Psy.D. Clinical Neuropsychologist

## 2024-02-11 NOTE — Progress Notes (Signed)
 Occupational Therapy Session Note  Patient Details  Name: Kathleen [name] MRN: [id number] Date of Birth: September 22, 1960  Today's Date: 02/11/2024 OT Individual Time: 0800-0900 OT Individual Time Calculation (min): 60 min    Short Term Goals: Week 1:  OT Short Term Goal 1 (Week 1): Pt will perform stand pivot transfer to the toilet/ BSC with RW with min A consistently OT Short Term Goal 2 (Week 1): Pt will perform toileting with max A 2/3 steps OT Short Term Goal 3 (Week 1): Pt will perform bed mobility with min A from flat bed with bed rails OT Short Term Goal 4 (Week 1): Pt will be able to don LB clothing (underwear/ pants ) with mi nA  Skilled Therapeutic Interventions/Progress Updates:      Therapy Documentation Precautions:  Precautions Precautions: Fall Recall of Precautions/Restrictions: Intact Precaution/Restrictions Comments: TLSO brace (when ambulating), pt refused to don at evaluation Required Braces or Orthoses: Spinal Brace Spinal Brace: Thoracolumbosacral orthotic Restrictions Weight Bearing Restrictions Per Provider Order: No General: Pt supine in bed upon OT arrival, agreeable to OT session. Husband present   Vital Signs: stable vitals throughout continuous monitor, was on 5L in [place] upon arrival, pt reporting wanting to decrease to 4L   Pain:  10/10 pain reported in low back, activity, intermittent rest breaks, distractions provided for pain management, pt reports tolerable to proceed.   ADL: OT providing skilled intervention on ADL retraining in order to increase independence with tasks and increase activity tolerance. Pt completed the following tasks at the current level of assist: Bed mobility: SBA with HOB raised supine>EOB Grooming/oral hygiene: SBA seated at sink in W/C for grooming hair and washing face UB dressing: SBA seated EOB for donning/doffing overhead shirt LB dressing: Min A with use of reacher, pt hesitant for use of reacher, although when used  increasing independence, pt using lateral leans on EOB to doff, standing to don over waist Footwear: total A to don new socks Transfers: Min A with use of RW from EOB>W/C using stand step method, VC for slow pace and breathing techniques.   Other Treatments: OT reviewing results of spine x-ray with pt d/t pt asking. OT using therapeutic use of self and VC for breathing techniques for decreased anxiety/anxious behaviors while completing ADL tasks.    Pt seated in W/C at end of session with direct handoff to PT.   Therapy/Group: Individual Therapy  Nila Barth, OTD, OTR/L 02/11/2024, 12:55 PM

## 2024-02-11 NOTE — Progress Notes (Addendum)
 Physical Therapy Session Note  Patient Details  Name: Kathleen [name] MRN: [id number] Date of Birth: 1960/10/26  Today's Date: 02/11/2024 PT Individual Time: 1415-1500 PT Individual Time Calculation (min): 45 min   Short Term Goals: Week 1:  PT Short Term Goal 1 (Week 1): Pt will complete rolling in bed with supervision PT Short Term Goal 2 (Week 1): Pt will complete supine<->sit with CGA. PT Short Term Goal 3 (Week 1): Pt will complete sit to stand with CGA + LRAD PT Short Term Goal 4 (Week 1): Pt will maintain dynamic standing balance with minA + LRAD  Skilled Therapeutic Interventions/Progress Updates:    Pt in bed, left side lying when PT arrived with husband present. Pt agreeable for therapy.  Pt demonstrates good mobility right rolling in bed using railing for support. Lying to sit with mod/max assist for trunk support. Pt able to sit EOB indep with UE support. TLSO brace donned. Pt states it has been adjusted to fit her. Transfer from bed to w/c with min assist using RW. Pt wheeled to main gym for gait tx with x2 assist with oxygen . Pt unable to stand this afternoon.  PT/pt attempted multiple times to stand. Pt unable to push self up with LE's and hands. Final attempt, pt states she has to use the toilet (BM) and needed to get to her room quickly.  Pt's husband had to stand pivot transfer pt from w/c to [facility]. PT notified RN pt on BSC, pt requesting pain meds.  Pt upset and disappointed she could not stand.  Missed 30 minutes of session due to pt's fatigue and toileting needs.  Therapy Documentation Precautions:  Precautions Precautions: Fall Recall of Precautions/Restrictions: Intact Precaution/Restrictions Comments: TLSO brace (when ambulating), pt refused to don at evaluation Required Braces or Orthoses: Spinal Brace Spinal Brace: Thoracolumbosacral orthotic Restrictions Weight Bearing Restrictions Per Provider Order: No  Pain: Pain Assessment Pain Scale: 0-10 Pain Score:  10-Worst pain ever Pain Location: Back Pain Intervention(s): Medication (See eMAR)     Therapy/Group: Individual Therapy  Yong Henle 02/11/2024, 3:21 PM

## 2024-02-11 NOTE — Progress Notes (Signed)
 Physical Therapy Session Note  Patient Details  Name: Kathleen [name] MRN: [id number] Date of Birth: Jan 28, 1961  Today's Date: 02/11/2024 PT Individual Time: 0900-1000 PT Individual Time Calculation (min): 60 min   Short Term Goals: Week 1:  PT Short Term Goal 1 (Week 1): Pt will complete rolling in bed with supervision PT Short Term Goal 2 (Week 1): Pt will complete supine<->sit with CGA. PT Short Term Goal 3 (Week 1): Pt will complete sit to stand with CGA + LRAD PT Short Term Goal 4 (Week 1): Pt will maintain dynamic standing balance with minA + LRAD  Skilled Therapeutic Interventions/Progress Updates:  Pt sitting in w/c combing her hair when PT arrived.  Pt agreeable for PT. Pt reports she feels like her lungs are filled with water  and states if only the doctor could give me some Lasix  overnight to get the fluid out, I'll be ok.  PT wheeled pt to gym for conservation of energy, donned TLSO brace. Session focusing on endurance, safe transfers, breathing techniques, standing tolerance.   Sit<->stand with mod assist, cueing needed for ant wt shifting and hand placement. Pt able to stand x3, 30 max tolerance with RW.  Pt fatigues easily and requires multiple rest breaks.  Pt unable to ambulate due to safety. Will attempt this afternoon.  Pt returned to bed in room with call bell, phone within reach, husband present.       Therapy Documentation Precautions:  Precautions Precautions: Fall Recall of Precautions/Restrictions: Intact Precaution/Restrictions Comments: TLSO brace (when ambulating), pt refused to don at evaluation Required Braces or Orthoses: Spinal Brace Spinal Brace: Thoracolumbosacral orthotic Restrictions Weight Bearing Restrictions Per Provider Order: No    Pain: Pain Assessment Pain Scale: 0-10 Pain Score: 9  Pain Location: Back Pain Intervention(s): Medication (See eMAR)    Therapy/Group: Individual Therapy  Yong Henle 02/11/2024, 12:56 PM

## 2024-02-11 NOTE — Progress Notes (Signed)
 Physical Therapy Session Note  Patient Details  Name: Kathleen [name] MRN: [id number] Date of Birth: [birth year]  Today's Date: 02/10/2024 PT Individual Time: 1530 - 1630     Short Term Goals: Week 1:  PT Short Term Goal 1 (Week 1): Pt will complete rolling in bed with supervision PT Short Term Goal 2 (Week 1): Pt will complete supine<->sit with CGA. PT Short Term Goal 3 (Week 1): Pt will complete sit to stand with CGA + LRAD PT Short Term Goal 4 (Week 1): Pt will maintain dynamic standing balance with minA + LRAD  Skilled Therapeutic Interventions/Progress Updates:  Pt in bed when PT arrived, pt willing to participate with PT.  Pt able to transfer from supine to sit EOB with supervision, donned TLSO brace.  Sit<->stand to RW mod assist for push off.  Standing with RW tolerance 15 before pt becomes anxious and needs to sit. Pt lacks proper eccentric control with descent, requires multiple cueing for hand placement/sequencing mostly due to impulsivity and panic.  Pt able to perform x3 trials and will become too fatigued.  Seated LAQs, ankle pumps, marching x10 reps. PT discussed lip pursed breathing with pt throughout visit. Pt is able to stand with SPO2 90% when she is breathing and relaxed, however, will drop to 80% with conversation and pt will react and become anxious. Pt does verbalize her willingness to work with therapy.  Pt transferred into bed post tx, had difficulty with getting her positioned properly for her comfort.  Head elevated, call light within patient's reach at end of session.        Therapy Documentation Precautions:  Precautions Precautions: Fall Recall of Precautions/Restrictions: Intact Precaution/Restrictions Comments: TLSO brace (when ambulating), pt refused to don at evaluation Required Braces or Orthoses: Spinal Brace Spinal Brace: Thoracolumbosacral orthotic Restrictions Weight Bearing Restrictions Per Provider Order: No     Pain: 10/10  LBP     Therapy/Group: Individual Therapy  Yong Henle 02/11/2024, 8:35 AM

## 2024-02-11 NOTE — Plan of Care (Signed)
  Problem: Consults Goal: RH SPINAL CORD INJURY PATIENT EDUCATION Description:  See Patient Education module for education specifics.  Outcome: Progressing   Problem: SCI BOWEL ELIMINATION Goal: RH STG MANAGE BOWEL WITH ASSISTANCE Description: STG Manage Bowel with toileting Assistance. Outcome: Progressing Goal: RH STG SCI MANAGE BOWEL WITH MEDICATION WITH ASSISTANCE Description: STG SCI Manage bowel with medication with mod I assistance. Outcome: Progressing

## 2024-02-11 NOTE — Progress Notes (Signed)
 PROGRESS NOTE   Subjective/Complaints: Continues to have pain in her back.  Continues to have anxiety-chronic issue.  X-ray L-spine indicated worsening compression deformities L1 level.  Patient asked about x-ray indicating fluid in her lung .  Reviewed chart and I think she is referring to x-ray on 615 where she had patchy bibasilar opacities, possibly atelectasis.  We discussed using incentive spirometer.  ROS: Patient denies fever, rash, sore throat, blurred vision, dizziness,  vomiting, diarrhea, cough, shortness of breath or chest pain, joint or back/neck pain, headache, or mood change.  + back pain + anxiety   Objective:   DG Lumbar Spine 2-3 Views Result Date: 02/10/2024 CLINICAL DATA:  Back pain. EXAM: LUMBAR SPINE - 3 VIEW COMPARISON:  01/25/2024. FINDINGS: L1, L3 and L4 compression deformities. L1 compression fracture has worsened compared to the prior study. No spondylolisthesis. No focal osteolytic or osteoblastic changes. IMPRESSION: Multiple compression deformities with worsening at the L1 level. Electronically Signed   By: Sydell Eva M.D.   On: 02/10/2024 19:45   No results for input(s): WBC, HGB, HCT, PLT in the last 72 hours.  No results for input(s): NA, K, CL, CO2, GLUCOSE, BUN, CREATININE, CALCIUM  in the last 72 hours.   Intake/Output Summary (Last 24 hours) at 02/11/2024 1554 Last data filed at 02/11/2024 0956 Gross per 24 hour  Intake 120 ml  Output --  Net 120 ml        Physical Exam: Vital Signs Blood pressure 112/65, pulse 95, temperature 97.9 F (36.6 C), resp. rate 19, height 5' 10 (1.778 m), weight 124.9 kg, last menstrual period 05/17/2016, SpO2 97%.   Constitutional: No distress . Vital signs reviewed.  Patient with therapy in the gym HEENT: NCAT, EOMI, oral membranes moist Neck: supple Cardiovascular: RRR without murmur. No JVD    Respiratory/Chest: CTA  Bilaterally without wheezes or rales. Normal effort    GI/Abdomen: BS +, non-tender, distended Ext: no clubbing, cyanosis, or edema Psych: pleasant and cooperative  Musculoskeletal:     Cervical and low back: Tenderness present.     Comments: ongoing LE edema to ankles-  Ue's5-/5 in Deltoids, biceps, triceps; WE 4/5- give way weakness; Grip 4+/5 and FA 4+/5 B/L LE's- 5-/5 throughout B/L    Skin:    General: Skin is warm and dry.         Neurological:     Comments: Patient is   alert.   Oriented x 3 and follows commands. Good insight and awareness.   Moves all 4 limbs. CN exam non-focal. Fair insight Intact to light touch in all 4 extremities   Prior neuro assessment is c/w today's exam 02/11/2024.    Assessment/Plan: 1. Functional deficits which require 3+ hours per day of interdisciplinary therapy in a comprehensive inpatient rehab setting. Physiatrist is providing close team supervision and 24 hour management of active medical problems listed below. Physiatrist and rehab team continue to assess barriers to discharge/monitor patient progress toward functional and medical goals  Care Tool:  Bathing    Body parts bathed by patient: Left arm, Right arm, Abdomen, Chest, Face   Body parts bathed by helper: Front perineal area, Buttocks, Right upper leg, Left  upper leg, Right lower leg, Left lower leg     Bathing assist Assist Level: Maximal Assistance - Patient 24 - 49%     Upper Body Dressing/Undressing Upper body dressing   What is the patient wearing?: Orthosis Orthosis activity level: Performed by helper  Upper body assist Assist Level: Total Assistance - Patient < 25%    Lower Body Dressing/Undressing Lower body dressing      What is the patient wearing?: Incontinence brief, Pants     Lower body assist Assist for lower body dressing: Total Assistance - Patient < 25%     Toileting Toileting    Toileting assist Assist for toileting: Total Assistance - Patient  < 25%     Transfers Chair/bed transfer  Transfers assist     Chair/bed transfer assist level: Minimal Assistance - Patient > 75%     Locomotion Ambulation   Ambulation assist      Assist level: Moderate Assistance - Patient 50 - 74% Assistive device: Walker-rolling Max distance: x3'   Walk 10 feet activity   Assist  Walk 10 feet activity did not occur: Safety/medical concerns        Walk 50 feet activity   Assist Walk 50 feet with 2 turns activity did not occur: Safety/medical concerns         Walk 150 feet activity   Assist Walk 150 feet activity did not occur: Safety/medical concerns         Walk 10 feet on uneven surface  activity   Assist Walk 10 feet on uneven surfaces activity did not occur: Safety/medical concerns         Wheelchair     Assist Is the patient using a wheelchair?: Yes Type of Wheelchair: Manual Wheelchair activity did not occur: Safety/medical concerns         Wheelchair 50 feet with 2 turns activity    Assist    Wheelchair 50 feet with 2 turns activity did not occur: Safety/medical concerns       Wheelchair 150 feet activity     Assist  Wheelchair 150 feet activity did not occur: Safety/medical concerns       Blood pressure 112/65, pulse 95, temperature 97.9 F (36.6 C), resp. rate 19, height 5' 10 (1.778 m), weight 124.9 kg, last menstrual period 05/17/2016, SpO2 97%.  Medical Problem List and Plan: 1. Functional deficits secondary to compression and multiple fractures of spine without SCI-intractable acute on chronic low back pain due to osteoporotic fracture involving L1, L3 and L4/acute left pubic symphysis and right sacral ala fracture.  Patient declined kyphoplasty.  Conservative management with narcotics and TLSO             -patient may  shower             -ELOS/Goals: 12-14 days- supervision to min A             -Continue CIR therapies including PT, OT   -Team conference today  please see physician documentation under team conference tab, met with team  to discuss problems,progress, and goals. Formulized individual treatment plan based on medical history, underlying problem and comorbidities.   - L-spine x-ray worsening compression deformity L1 level  2.  Antithrombotics: -DVT/anticoagulation:  Pharmaceutical: Lovenox .  Check vascular study 6/13- Dopplers (-)             -antiplatelet therapy: N/A 3. Pain Management/chronic pain followed at [facility] pain clinic- usually takes 10 mg 6x/day: Flexeril  5 mg 3 times daily, lidoderm   patch, oxycodone  30 mg every 4 hours as needed upon transfer- attempting, with d/w pt and husband, to add MS Contin  15 mg BID and reduce Oxycodone  to 20 mg q4 hours  6/13- changed to Oxycontin  10 mg BID and continue Oxy 20 mg q4 hours prn  6/16 increase oxycontin  to 15mg  Q12h  6/17 decrease OxyContin  to 20 mg every 12 hours 4. Mood/Behavior/Sleep:  Pt has severe anxiety Cymbalta  60 mg daily, BuSpar  15 mg 3 times daily, Atarax  25 mg 3 times daily as needed             -added Celexa  10 mg daily for anxiety- is better for anxiety than Duloxetine , but need Duloxetine  for pain control  - 6/17 seen by neuropsychology for anxiety with depression, appreciate assistance             -antipsychotic agents: N/A 5. Neuropsych/cognition: This patient is ?capable of making decisions on her own behalf. 6. Skin/Wound Care: Routine skin checks 7. Fluids/Electrolytes/Nutrition: Routine in and outs with follow-up chemistries 8.  Sarcoidosis/interstitial lung disease/chronic respiratory failure with hypoxia- Was on 4-5L at home- been doing 3-4L here- and concern for hypercarbia.  Continue chronic oxygen  therapy.  Prednisone  as directed, Lasix  20 mg Monday Wednesday Fridays.  Followed by Dr. Jolyne Needs pulmonary services [facility]  6/14- some sob this morning but VS ok. ?anxiety component  -she is receiving brovana  and pulmicort  scheduled with albuterol  prn  -added IS/ and  FV today  6/15 breathing better today. I asked her if she took prednisone  with hydrocortisone , and she told me she did at one point, but no longer.    -obsv for now  6/16-17 breathing doing ok, continue to monitor  Discussed Incentive spirometer SpO2: 97 % O2 Flow Rate (L/min): 4.5 L/min  9.  Left femoral head avascular necrosis.Probably has bilaterally, based on chart review  Not a surgical candidate.  Conservative care  6/13- went over with therapy- that her goals from my point of view is to walk 15-25 ft, but probably not more- will not make AVN worse, but will hurt more-  10.  E. coli UTI as well as chronic urinary incontinence.  Status post 3-day course of IV Rocephin  completed.  Continue Flomax  0.4 mg daily.  Check PVR  6/14-15 pt is voiding continently. No PVR's but I'm not sure they'reneeded right now  6/16 check PVRs, pt reports she doesn't empty completely - do not see any completed yet 11.  Normocytic anemia.  Borderline MCV of 96 and RDW of 16.  B12 and folate within normal limits.  Check CBC  6/14- CBC stable at 8.3  Order CBC 12.  Addison's disease.  Continue hydrocortisone  chronically.  Followed by endocrinology services Dr.Gebreselassie Nida 13.  Chronic hypotension.  ProAmatine  5 mg twice daily 14.  Class III morbid obesity.  BMI 39.75.  Dietary follow-up 15.  Hyperlipidemia.  Crestor  16.  Hypothyroidism.  Synthroid  17.  Chronic constipation.  Linzess  72 mcg daily, Senokot S2 tablets bedtime, Chronulac  as needed  6/13- LBM 2 days ago- she says doesn't go every day  6/15 I suspect her nausea is related to this. We also started fosamax  yesterday. Discussed how oxycodone  can cause nausea and constipation   -  KUB today reveals moderate stool burden, no ileus   -will order fleets enema   -rx nausea, increase ppi to bid   -increase senna to bid  6/16 reports good results with enema yesterday, continue to monitor   6/17 LBM 6/15 consider discharging  with medication if no BM  by then 18.  GERD.  Protonix  19. Noncompliance- Per nursing, pt refusing to use TLSO for [facility] transfers, of note- will d/w pt that need to follow recs, or there's more risk of additional fractures. 20. Hypokalemia  6/13- will replete 40 mEq and then recheck Monday  Order labs for today  LOS: 5 days A FACE TO FACE EVALUATION WAS PERFORMED  Lylia Sand 02/11/2024, 3:54 PM

## 2024-02-11 NOTE — Progress Notes (Signed)
 Patient ID: Kathleen [name], female   DOB: Jul 27, 1961, 63 y.o.   MRN: [id number]  Met with pt, daughter and husband who are present to give team conference update regarding goals of min-CGA for ADL's and supervision for PT goals. She can feel she is getting stronger and her husband voiced she is stronger than she was. She is in agreement with someone coming to check the fit of her brace since she feels it does not fit well and would wear it more if fit her. She feels like it is choking her when she sits down and therapist agree. Angelina-RN Care Coordinator to call ortho and have tech come to check this. All aware target discharge date is 7/1. She hopes to do well and is trying her best to get better. Discussed will meet again next Tuesday and discuss her progress again. Pt wants to make sure she is doing her best before going home. Will work on discharge needs.

## 2024-02-12 ENCOUNTER — Ambulatory Visit: Admitting: Family Medicine

## 2024-02-12 DIAGNOSIS — J849 Interstitial pulmonary disease, unspecified: Secondary | ICD-10-CM | POA: Diagnosis not present

## 2024-02-12 DIAGNOSIS — M545 Low back pain, unspecified: Secondary | ICD-10-CM | POA: Diagnosis not present

## 2024-02-12 DIAGNOSIS — S129XXD Fracture of neck, unspecified, subsequent encounter: Secondary | ICD-10-CM | POA: Diagnosis not present

## 2024-02-12 DIAGNOSIS — K5900 Constipation, unspecified: Secondary | ICD-10-CM | POA: Diagnosis not present

## 2024-02-12 DIAGNOSIS — R7989 Other specified abnormal findings of blood chemistry: Secondary | ICD-10-CM

## 2024-02-12 NOTE — Progress Notes (Signed)
 PROGRESS NOTE   Subjective/Complaints: Continues to have back pain. Able to walk 30 feet and is happy about this!  ROS: Patient denies fever, rash, vomiting, diarrhea, shortness of breath or chest pain,headache  + back pain + anxiety- a little improved   Objective:   DG Lumbar Spine 2-3 Views Result Date: 02/10/2024 CLINICAL DATA:  Back pain. EXAM: LUMBAR SPINE - 3 VIEW COMPARISON:  01/25/2024. FINDINGS: L1, L3 and L4 compression deformities. L1 compression fracture has worsened compared to the prior study. No spondylolisthesis. No focal osteolytic or osteoblastic changes. IMPRESSION: Multiple compression deformities with worsening at the L1 level. Electronically Signed   By: Sydell Eva M.D.   On: 02/10/2024 19:45   Recent Labs    02/11/24 1633  WBC 8.6  HGB 8.5*  HCT 29.2*  PLT 472*    Recent Labs    02/11/24 1633  NA 139  K 4.4  CL 98  CO2 33*  GLUCOSE 126*  BUN <5*  CREATININE 1.04*  CALCIUM  8.7*     Intake/Output Summary (Last 24 hours) at 02/12/2024 1504 Last data filed at 02/12/2024 0700 Gross per 24 hour  Intake 118 ml  Output --  Net 118 ml        Physical Exam: Vital Signs Blood pressure (!) 133/56, pulse 94, temperature 98.5 F (36.9 C), temperature source Oral, resp. rate 17, height 5' 10 (1.778 m), weight 124.9 kg, last menstrual period 05/17/2016, SpO2 99%.   Constitutional: No distress . Vital signs reviewed.  Sitting in WC in her room HEENT: NCAT, EOMI, oral membranes moist Neck: supple Cardiovascular: RRR without murmur. No JVD    Respiratory/Chest: CTA Bilaterally without wheezes or rales. Normal effort    GI/Abdomen: BS +, non-tender, distended Ext: no clubbing, cyanosis, or edema Psych: pleasant and cooperative  Musculoskeletal:     Cervical and low back: Tenderness present.     Comments: ongoing LE edema to ankles-  Ue's5-/5 in Deltoids, biceps, triceps; WE 4/5- give  way weakness; Grip 4+/5 and FA 4+/5 B/L LE's- 5-/5 throughout B/L    Skin:    General: Skin is warm and dry.         Neurological:     Comments: Patient is   alert.   Oriented x 3 and follows commands. Good insight and awareness.   Moves all 4 limbs. CN exam non-focal. Fair insight Intact to light touch in all 4 extremities   Prior neuro assessment is c/w today's exam 02/12/2024.    Assessment/Plan: 1. Functional deficits which require 3+ hours per day of interdisciplinary therapy in a comprehensive inpatient rehab setting. Physiatrist is providing close team supervision and 24 hour management of active medical problems listed below. Physiatrist and rehab team continue to assess barriers to discharge/monitor patient progress toward functional and medical goals  Care Tool:  Bathing    Body parts bathed by patient: Left arm, Right arm, Abdomen, Chest, Face   Body parts bathed by helper: Front perineal area, Buttocks, Right upper leg, Left upper leg, Right lower leg, Left lower leg     Bathing assist Assist Level: Maximal Assistance - Patient 24 - 49%     Upper Body  Dressing/Undressing Upper body dressing   What is the patient wearing?: Orthosis Orthosis activity level: Performed by helper  Upper body assist Assist Level: Total Assistance - Patient < 25%    Lower Body Dressing/Undressing Lower body dressing      What is the patient wearing?: Incontinence brief, Pants     Lower body assist Assist for lower body dressing: Total Assistance - Patient < 25%     Toileting Toileting    Toileting assist Assist for toileting: Total Assistance - Patient < 25%     Transfers Chair/bed transfer  Transfers assist     Chair/bed transfer assist level: Minimal Assistance - Patient > 75%     Locomotion Ambulation   Ambulation assist      Assist level: Supervision/Verbal cueing Assistive device: Walker-rolling Max distance: 2 x 15'   Walk 10 feet  activity   Assist  Walk 10 feet activity did not occur: Safety/medical concerns  Assist level: Supervision/Verbal cueing Assistive device: Walker-rolling   Walk 50 feet activity   Assist Walk 50 feet with 2 turns activity did not occur: Safety/medical concerns         Walk 150 feet activity   Assist Walk 150 feet activity did not occur: Safety/medical concerns         Walk 10 feet on uneven surface  activity   Assist Walk 10 feet on uneven surfaces activity did not occur: Safety/medical concerns         Wheelchair     Assist Is the patient using a wheelchair?: Yes Type of Wheelchair: Manual Wheelchair activity did not occur: Safety/medical concerns         Wheelchair 50 feet with 2 turns activity    Assist    Wheelchair 50 feet with 2 turns activity did not occur: Safety/medical concerns       Wheelchair 150 feet activity     Assist  Wheelchair 150 feet activity did not occur: Safety/medical concerns       Blood pressure (!) 133/56, pulse 94, temperature 98.5 F (36.9 C), temperature source Oral, resp. rate 17, height 5' 10 (1.778 m), weight 124.9 kg, last menstrual period 05/17/2016, SpO2 99%.  Medical Problem List and Plan: 1. Functional deficits secondary to compression and multiple fractures of spine without SCI-intractable acute on chronic low back pain due to osteoporotic fracture involving L1, L3 and L4/acute left pubic symphysis and right sacral ala fracture.  Patient declined kyphoplasty.  Conservative management with narcotics and TLSO             -patient may  shower             -ELOS/Goals: 12-14 days- supervision to min A             -Continue CIR therapies including PT, OT   - L-spine x-ray worsening compression deformity L1 level  -Exp dc 7/1  -Reports she walked 30 feet!  2.  Antithrombotics: -DVT/anticoagulation:  Pharmaceutical: Lovenox .  Check vascular study 6/13- Dopplers (-)             -antiplatelet therapy:  N/A 3. Pain Management/chronic pain followed at [facility] pain clinic- usually takes 10 mg 6x/day: Flexeril  5 mg 3 times daily, lidoderm  patch, oxycodone  30 mg every 4 hours as needed upon transfer- attempting, with d/w pt and husband, to add MS Contin  15 mg BID and reduce Oxycodone  to 20 mg q4 hours  6/13- changed to Oxycontin  10 mg BID and continue Oxy 20 mg q4 hours prn  6/16 increase oxycontin  to 15mg  Q12h  6/17 decrease OxyContin  to 20 mg every 12 hours 4. Mood/Behavior/Sleep:  Pt has severe anxiety Cymbalta  60 mg daily, BuSpar  15 mg 3 times daily, Atarax  25 mg 3 times daily as needed             -added Celexa  10 mg daily for anxiety- is better for anxiety than Duloxetine , but need Duloxetine  for pain control  - 6/17 seen by neuropsychology for anxiety with depression, appreciate assistance             -antipsychotic agents: N/A 5. Neuropsych/cognition: This patient is ?capable of making decisions on her own behalf. 6. Skin/Wound Care: Routine skin checks 7. Fluids/Electrolytes/Nutrition: Routine in and outs with follow-up chemistries 8.  Sarcoidosis/interstitial lung disease/chronic respiratory failure with hypoxia- Was on 4-5L at home- been doing 3-4L here- and concern for hypercarbia.  Continue chronic oxygen  therapy.  Prednisone  as directed, Lasix  20 mg Monday Wednesday Fridays.  Followed by Dr. Jolyne Needs pulmonary services [facility]  6/14- some sob this morning but VS ok. ?anxiety component  -she is receiving brovana  and pulmicort  scheduled with albuterol  prn  -added IS/ and FV today  6/15 breathing better today. I asked her if she took prednisone  with hydrocortisone , and she told me she did at one point, but no longer.    -obsv for now  6/16-18 breathing doing ok, continue to monitor  Discussed Incentive spirometer SpO2: 99 % O2 Flow Rate (L/min): 5 L/min  9.  Left femoral head avascular necrosis.Probably has bilaterally, based on chart review  Not a surgical candidate.  Conservative  care  6/13- went over with therapy- that her goals from my point of view is to walk 15-25 ft, but probably not more- will not make AVN worse, but will hurt more-  10.  E. coli UTI as well as chronic urinary incontinence.  Status post 3-day course of IV Rocephin  completed.  Continue Flomax  0.4 mg daily.  Check PVR  6/14-15 pt is voiding continently. No PVR's but I'm not sure they'reneeded right now  6/16 check PVRs, pt reports she doesn't empty completely - do not see any completed yet 11.  Normocytic anemia.  Borderline MCV of 96 and RDW of 16.  B12 and folate within normal limits.  Check CBC  6/14- CBC stable at 8.3  Order CBC 12.  Addison's disease.  Continue hydrocortisone  chronically.  Followed by endocrinology services Dr.Gebreselassie Nida 13.  Chronic hypotension.  ProAmatine  5 mg twice daily 14.  Class III morbid obesity.  BMI 39.75.  Dietary follow-up 15.  Hyperlipidemia.  Crestor  16.  Hypothyroidism.  Synthroid  17.  Chronic constipation.  Linzess  72 mcg daily, Senokot S2 tablets bedtime, Chronulac  as needed  6/13- LBM 2 days ago- she says doesn't go every day  6/15 I suspect her nausea is related to this. We also started fosamax  yesterday. Discussed how oxycodone  can cause nausea and constipation   -  KUB today reveals moderate stool burden, no ileus   -will order fleets enema   -rx nausea, increase ppi to bid   -increase senna to bid  6/16 reports good results with enema yesterday, continue to monitor   6/17 LBM 6/15 consider additional medication if no BM by then  6/18 Patient reports BM yesterday, continue to monitor  18.  GERD.  Protonix  19. Noncompliance- Per nursing, pt refusing to use TLSO for [facility] transfers, of note- will d/w pt that need to follow recs, or there's more risk of additional  fractures. 20. Hypokalemia  6/13- will replete 40 mEq and then recheck Monday  6/18 stable K+ at 4.4 21. Azotemia  -6/18 Cr 1.04 encourage oral fluids  LOS: 6 days A FACE TO FACE  EVALUATION WAS PERFORMED  Kathleen [name] 02/12/2024, 3:04 PM

## 2024-02-12 NOTE — Patient Care Conference (Signed)
 Inpatient RehabilitationTeam Conference and Plan of Care Update Date: 02/11/2024   Time: 1137 am    Patient Name: Kathleen [name]      Medical Record Number: [id number]  Date of Birth: 08-Jun-1961 Sex: Female         Room/Bed: 4W06C/4W06C-01 Payor Info: Payor: Vincente Green ADVANTAGE / Plan: Gwendel Lemme HMO / Product Type: *No Product type* /    Admit Date/Time:  02/06/2024  2:28 PM  Primary Diagnosis:  Spinal compression fracture [facility])  Hospital Problems: Principal Problem:   Spinal compression fracture (HCC) Active Problems:   Anxiety and depression   Intractable back pain   Anxiety    Expected Discharge Date: Expected Discharge Date: 02/25/24  Team Members Present: Physician leading conference: Dr. Lylia Sand Social Worker Present: Adrianna Albee, LCSW Nurse Present: Jerene Monks, RN PT Present: Gita Lamb, PT OT Present: Nila Barth, OT     Current Status/Progress Goal Weekly Team Focus  Bowel/Bladder   continent of b/b; LBM: 6/15   (P) gain regular bowel pattern   (P) assist with toileting needs prn    Swallow/Nutrition/ Hydration               ADL's   Min LB ADL with reacher, SBA UB ADLs, as low as Min A for transfers with RW   CGA LB ADLs, SBA UB ADLs, Min A-SBA for standing/transfers   safety awareness, anxiety/coping strategies, general strengthening/endurance    Mobility   min/mod assist with transfers   CGA with transfers  strengthening, endurance, breathing techniques, pain management    Communication                Safety/Cognition/ Behavioral Observations               Pain   (P) c/o chronic back pain; scheduled and prn pain meds   (P) pain level <8/10   (P) assess pain QS and prn    Skin   (P) skin intact             Discharge Planning:  Home with husband who was assisting prior to admission but does have a bad back.  Pt hopes to be stronger and move better from here. Neuro-psych to see    Team  Discussion: Patient  was admitted post compression and multiple fractures of spine, osteoporotic fracture with chronic low back pain. Patient declined kyphoplasty. Conservative management with narcotics and TLSO. Patient limited by severe low back pain, anxiety, impulsivity, hypoxia on continuous oxygen  use, urinary tract infection and non compliance with use of TLSO brace.   Patient on target to meet rehab goals: Currently, patient requires minimal assistance with lower body ADLs using a reacher and stand by assist with upper body care. Patient requires minimal assistance with transfers using a rolling walker. Overall goals at discharge are set for min -CGA.   *See Care Plan and progress notes for long and short-term goals.   Revisions to Treatment Plan:  Incentive Spirometer Reacher Neuro-psych consult Spine X-ray Air mattress TLSO brace Oxygen  machine Ortho tech consult   Teaching Needs: Safety, medications, transfers, toileting, TLSO brace use, etc.   Current Barriers to Discharge: Decreased caregiver support, Home enviroment access/layout, and Weight  Possible Resolutions to Barriers: Family education Home health follow-up DME:TTB     Medical Summary Current Status: compression fx, pain, anxeity, interstitial lung disease, constipation, hypokalemia, anemia  Barriers to Discharge: Behavior/Mood;Electrolyte abnormality;Medical stability;Morbid Obesity;Self-care education;Oxygen  Requirement  Barriers to Discharge Comments: compression fx, pain, anxeity, interstitial lung  disease, constipation, hypokalemia, anemia Possible Resolutions to [employer] Focus: pain medications, anxiety medications, breathing treatments, continue O2, monitor bowel function   Continued Need for Acute Rehabilitation Level of Care: The patient requires daily medical management by a physician with specialized training in physical medicine and rehabilitation for the following reasons: Direction of a  multidisciplinary physical rehabilitation program to maximize functional independence : Yes Medical management of patient stability for increased activity during participation in an intensive rehabilitation regime.: Yes Analysis of laboratory values and/or radiology reports with any subsequent need for medication adjustment and/or medical intervention. : Yes   I attest that I was present, lead the team conference, and concur with the assessment and plan of the team.   Bryor Rami Gayo 02/11/2024, 1137 am

## 2024-02-12 NOTE — Progress Notes (Addendum)
 Physical Therapy Session Note  Patient Details  Name: Kathleen [name] MRN: [id number] Date of Birth: [birth year]  Today's Date: 02/12/2024 PT Individual Time: 1300-1345 PT Individual Time Calculation (min): 45 min   Short Term Goals: Week 1:  PT Short Term Goal 1 (Week 1): Pt will complete rolling in bed with supervision PT Short Term Goal 2 (Week 1): Pt will complete supine<->sit with CGA. PT Short Term Goal 3 (Week 1): Pt will complete sit to stand with CGA + LRAD PT Short Term Goal 4 (Week 1): Pt will maintain dynamic standing balance with minA + LRAD  Skilled Therapeutic Interventions/Progress Updates:    Pt in w/c with TLSO brace donned when PT arrived, pt reports she needs to use the [facility].  PT and RN assisted pt to commode mod/max assist. PT and PT Tech assisted pt off commode to w/c and wheeled pt to gym.  Pt motivated to walk with therapy. Pt stated she walked earlier with PT x30 ft.  Pt requires x2 ppl for sit to stand mod/max assist to RW. Pt ambulates 2x15' with RW.  Pt insisted PT not hold onto her gait belt while walking.  Pt presents with elevated shoulders, increased velocity of gait, shuffled pattern.  No deviation noted in path of ambulation. SPO2 decreased to 88%, recovered to 92% within 45 and to 100% soon after; cueing needed for pursed lip breathing.  During last gait trial, pt reports having a panic attack and requires mod comforting to relax. Pt wheeled back to room, transferred to EOB x2 mod assist with RW for support.  Sit->supine mod assist for LE assist. Once in bed, pt cries out in pain and requires bed to be adjusted for comfort. Pt brought to [facility] x2 cueing for pt to assist with LE's. Pt was comfortable at end of session and left in bed with call bell, tray table in reach.  Pt states she does better in the morning and would like to have PT tomorrow in the AM.   Precautions:  Precautions Precautions: Fall Recall of Precautions/Restrictions:  Intact Precaution/Restrictions Comments: TLSO brace (when ambulating), pt refused to don at evaluation Required Braces or Orthoses: Spinal Brace Spinal Brace: Thoracolumbosacral orthotic Restrictions Weight Bearing Restrictions Per Provider Order: No  Pain: Pain Assessment Pain Scale: 0-10 Pain Score: 10-Worst pain ever Pain Location: Back Pain Intervention(s): Medication (See eMAR)     Therapy/Group: Individual Therapy  Yong Henle 02/12/2024, 2:28 PM

## 2024-02-12 NOTE — Progress Notes (Signed)
 Occupational Therapy Session Note  Patient Details  Name: Kathleen [name] MRN: [id number] Date of Birth: 02/12/1961  Today's Date: 02/12/2024 OT Individual Time: 1045-1200 OT Individual Time Calculation (min): 75 min    Short Term Goals: Week 1:  OT Short Term Goal 1 (Week 1): Pt will perform stand pivot transfer to the toilet/ BSC with RW with min A consistently OT Short Term Goal 2 (Week 1): Pt will perform toileting with max A 2/3 steps OT Short Term Goal 3 (Week 1): Pt will perform bed mobility with min A from flat bed with bed rails OT Short Term Goal 4 (Week 1): Pt will be able to don LB clothing (underwear/ pants ) with mi nA  Skilled Therapeutic Interventions/Progress Updates:      Therapy Documentation Precautions:  Precautions Precautions: Fall Recall of Precautions/Restrictions: Intact Precaution/Restrictions Comments: TLSO brace (when ambulating), pt refused to don at evaluation Required Braces or Orthoses: Spinal Brace Spinal Brace: Thoracolumbosacral orthotic Restrictions Weight Bearing Restrictions Per Provider Order: No General:  Pt seated in W/C upon OT arrival, agreeable to OT. 4L O2 through [place] for session  Vital Signs: vitals stable throughout session  Pain:  10/10 pain reported in low back, activity, intermittent rest breaks, distractions provided for pain management, pt reports tolerable to proceed.   Exercises: Pt completed the following exercise circuit in order to improve functional activity, strength and endurance to prepare for ADLs such as bathing. Pt completed the following exercises in seated position with no noted LOB/SOB and 3x10 repetitions on each exercise: -bicep curls with 3# dowel rod -triceps extensions with green theraband -forward punches with green theraband -shoulder flexion with 3# dowel rod -forward rows with 3# dowel rod -toe taps with 5 cone  OT providing PRN rest breaks after exercises for increased energy conservation d/t  decreased activity tolerance.  Pt seated in W/C at end of session with W/C alarm donned, call light within reach and 4Ps assessed. Daughter present.   Therapy/Group: Individual Therapy  Nila Barth, OTD, OTR/L 02/12/2024, 12:41 PM

## 2024-02-12 NOTE — Progress Notes (Signed)
 Physical Therapy Session Note  Patient Details  Name: Kathleen [name] MRN: [id number] Date of Birth: Jan 01, 1961  Today's Date: 02/12/2024 PT Individual Time: 0920-1030 PT Individual Time Calculation (min): 70 min   Short Term Goals: Week 1:  PT Short Term Goal 1 (Week 1): Pt will complete rolling in bed with supervision PT Short Term Goal 2 (Week 1): Pt will complete supine<->sit with CGA. PT Short Term Goal 3 (Week 1): Pt will complete sit to stand with CGA + LRAD PT Short Term Goal 4 (Week 1): Pt will maintain dynamic standing balance with minA + LRAD  Skilled Therapeutic Interventions/Progress Updates:     Pt received supine in bed receiving breathing treatment. Upon completion of treatment, pt is agreeable to therapy. Reports pain in back. PT provides rest breaks as needed and bracing with TLSO to manage pain. Pt performs supine to sit with minA and cues for logrolling and sequencing. Pt completes stand pivot transfer to WC on Rt side with modA +2 and cues for initiation and sequencing. WC transport to gym for time management. Pt performs multiple bouts of ambulation during session with extended seated rest breaks between bouts. Pt requires minA/modA +2 for sit to satnd, likely associated with anxiety more so than true weakness or physical need. PT provides cues for initiation and body mechanics. Pt ambulates x10', x20', and x30' with minA +1 and +2 for WC follow due to oxygen  tank management and pt's tendency to sit suddenly and with little warning when fatigued and/or anxious. Pt self propels WC x100' back to room with BLEs to work on endurance and mobility training. Left seated with all needs within reach.   Therapy Documentation Precautions:  Precautions Precautions: Fall Recall of Precautions/Restrictions: Intact Precaution/Restrictions Comments: TLSO brace (when ambulating), pt refused to don at evaluation Required Braces or Orthoses: Spinal Brace Spinal Brace: Thoracolumbosacral  orthotic Restrictions Weight Bearing Restrictions Per Provider Order: No    Therapy/Group: Individual Therapy  Neva Barban, PT, DPT 02/12/2024, 4:38 PM

## 2024-02-13 DIAGNOSIS — R300 Dysuria: Secondary | ICD-10-CM

## 2024-02-13 DIAGNOSIS — R1310 Dysphagia, unspecified: Secondary | ICD-10-CM

## 2024-02-13 LAB — URINALYSIS, W/ REFLEX TO CULTURE (INFECTION SUSPECTED)
Bilirubin Urine: NEGATIVE
Glucose, UA: NEGATIVE mg/dL
Ketones, ur: NEGATIVE mg/dL
Nitrite: NEGATIVE
Protein, ur: 30 mg/dL — AB
Specific Gravity, Urine: 1.006 (ref 1.005–1.030)
WBC, UA: >50 WBC/hpf (ref 0–5)
pH: 6 (ref 5.0–8.0)

## 2024-02-13 MED ORDER — CEPHALEXIN 250 MG PO CAPS
250.0000 mg | ORAL_CAPSULE | Freq: Four times a day (QID) | ORAL | Status: DC
  Administered 2024-02-13 – 2024-02-14 (×4): 250 mg via ORAL
  Filled 2024-02-13 (×4): qty 1

## 2024-02-13 NOTE — Progress Notes (Signed)
 Occupational Therapy Session Note  Patient Details  Name: Kathleen [name] MRN: [id number] Date of Birth: [birth year]  Today's Date: 02/13/2024 OT Individual Time: 1000-1100 & 1430-1525 OT Individual Time Calculation (min): 60 min & 55 min   Short Term Goals: Week 1:  OT Short Term Goal 1 (Week 1): Pt will perform stand pivot transfer to the toilet/ BSC with RW with min A consistently OT Short Term Goal 2 (Week 1): Pt will perform toileting with max A 2/3 steps OT Short Term Goal 3 (Week 1): Pt will perform bed mobility with min A from flat bed with bed rails OT Short Term Goal 4 (Week 1): Pt will be able to don LB clothing (underwear/ pants ) with mi nA  Skilled Therapeutic Interventions/Progress Updates:      Therapy Documentation Precautions:  Precautions Precautions: Fall Recall of Precautions/Restrictions: Intact Precaution/Restrictions Comments: TLSO brace (when ambulating), pt refused to don at evaluation Required Braces or Orthoses: Spinal Brace Spinal Brace: Thoracolumbosacral orthotic Restrictions Weight Bearing Restrictions Per Provider Order: No Session 1 General:  Pt supine in bed upon OT arrival, agreeable to OT session. Husband present  Vital Signs: vitals stable throughout session  Pain:  6/10 pain reported in back, activity, intermittent rest breaks, distractions provided for pain management, pt reports tolerable to proceed.   ADL: OT providing skilled intervention on ADL retraining in order to increase independence with tasks and increase activity tolerance. Pt completed the following tasks at the current level of assist: Bed mobility: SBA using bed rails supine><EOB Toilet transfer: Min A with RW stand step transfer from bed><BSC Toileting: Mod A overall, required some assistance with pants and hygiene UB dressing: SBA overall for donning/doffing overhead shirt LB dressing: Mod A, education with us  of reacher for donning pants over feet Shower transfer: Min A  using grab bars from W/C><TTB with no LOB, pt requiring encouragement d/t some anxiety  Bathing: Min A, pt issued long handle sponge, lateral leans for peri care, pt appreciative of shower  Pt supine in bed with bed alarm activated, 2 bed rails up, call light within reach and 4Ps assessed.   Session 2 General: Pt seated in W/C upon OT arrival, agreeable to OT.  Vital Signs: vitals stable for entire session  Pain:  4/10 pain reported in low back, activity, intermittent rest breaks, distractions provided for pain management, pt reports tolerable to proceed.    ADL: OT providing skilled intervention on ADL retraining in order to increase independence with tasks and increase activity tolerance. Pt completed the following tasks at the current level of assist: Toilet transfer: Mod A +2 for standing from W/C d/t low surface  Toileting: Mod A overall, pt seated on commode at end of session, verbal handoff to NT, see below   Exercises: Pt completed the following exercise circuit in order to improve functional activity, strength and endurance to prepare for ADLs such as bathing. Pt completed the following exercises in seated position with no noted LOB/SOB and 3x10 repetitions on each exercise: -bicep curls with 4# dowel rod -triceps extensions -shoulder abduction -forward punches -shoulder flexion with 4# dowel rod -ball hit with 4# dowel rod -forward rows with green theraband -knee flexion with green theraband   Pt seated on commode at end of session call light within reach and 4Ps assessed. Verbal handoff to NT to verbalize pt care needs once done with toileting   Therapy/Group: Individual Therapy  Nila Barth, OTD, OTR/L 02/13/2024, 3:35 PM

## 2024-02-13 NOTE — Progress Notes (Addendum)
 PROGRESS NOTE   Subjective/Complaints: She reports she was able to walk 70 feet today.  Reports some burning during urination.  Having some coughing when swallowing during meals.  ROS: Patient denies fever, rash, vomiting, diarrhea, shortness of breath or chest pain,headache  + back pain + anxiety- a little improved +dysuria   Objective:   No results found.  Recent Labs    02/11/24 1633  WBC 8.6  HGB 8.5*  HCT 29.2*  PLT 472*    Recent Labs    02/11/24 1633  NA 139  K 4.4  CL 98  CO2 33*  GLUCOSE 126*  BUN <5*  CREATININE 1.04*  CALCIUM  8.7*     Intake/Output Summary (Last 24 hours) at 02/13/2024 1244 Last data filed at 02/13/2024 0925 Gross per 24 hour  Intake --  Output 1200 ml  Net -1200 ml        Physical Exam: Vital Signs Blood pressure (!) 120/57, pulse 89, temperature 98.5 F (36.9 C), temperature source Oral, resp. rate 20, height 5' 10 (1.778 m), weight 124.9 kg, last menstrual period 05/17/2016, SpO2 98%.   Constitutional: No distress . Vital signs reviewed.  Sitting in [facility] working with therapy HEENT: NCAT, EOMI, oral membranes moist  Neck: supple Cardiovascular: RRR without murmur. No JVD    Respiratory/Chest: CTA Bilaterally without wheezes or rales.  Non-labored, [place] o2 SpO2: 98 % O2 Flow Rate (L/min): 4 L/min  GI/Abdomen: BS +, non-tender, distended Ext: no clubbing, cyanosis, or edema Psych: pleasant and cooperative  Musculoskeletal:     Cervical and low back: Tenderness present.     Comments: ongoing LE edema to ankles-  Ue's5-/5 in Deltoids, biceps, triceps; WE 4/5- give way weakness; Grip 4+/5 and FA 4+/5 B/L LE's- 5-/5 throughout B/L    Skin:    General: Skin is warm and dry.         Neurological:     Comments: Patient is   alert.   Oriented x 3 and follows commands. Good insight and awareness.   Moves all 4 limbs. CN exam non-focal. Fair insight Intact to light  touch in all 4 extremities   Prior neuro assessment is c/w today's exam 02/13/2024.    Assessment/Plan: 1. Functional deficits which require 3+ hours per day of interdisciplinary therapy in a comprehensive inpatient rehab setting. Physiatrist is providing close team supervision and 24 hour management of active medical problems listed below. Physiatrist and rehab team continue to assess barriers to discharge/monitor patient progress toward functional and medical goals  Care Tool:  Bathing    Body parts bathed by patient: Left arm, Right arm, Abdomen, Chest, Face   Body parts bathed by helper: Front perineal area, Buttocks, Right upper leg, Left upper leg, Right lower leg, Left lower leg     Bathing assist Assist Level: Maximal Assistance - Patient 24 - 49%     Upper Body Dressing/Undressing Upper body dressing   What is the patient wearing?: Orthosis Orthosis activity level: Performed by helper  Upper body assist Assist Level: Total Assistance - Patient < 25%    Lower Body Dressing/Undressing Lower body dressing      What is the patient wearing?:  Incontinence brief, Pants     Lower body assist Assist for lower body dressing: Total Assistance - Patient < 25%     Toileting Toileting    Toileting assist Assist for toileting: Total Assistance - Patient < 25%     Transfers Chair/bed transfer  Transfers assist     Chair/bed transfer assist level: Moderate Assistance - Patient 50 - 74%     Locomotion Ambulation   Ambulation assist      Assist level: Contact Guard/Touching assist Assistive device: Walker-rolling Max distance: 27.9', 50'   Walk 10 feet activity   Assist  Walk 10 feet activity did not occur: Safety/medical concerns  Assist level: Contact Guard/Touching assist Assistive device: Walker-rolling   Walk 50 feet activity   Assist Walk 50 feet with 2 turns activity did not occur: Safety/medical concerns  Assist level: Contact  Guard/Touching assist Assistive device: Walker-rolling    Walk 150 feet activity   Assist Walk 150 feet activity did not occur: Safety/medical concerns (pt too fatigued, SOB)         Walk 10 feet on uneven surface  activity   Assist Walk 10 feet on uneven surfaces activity did not occur: Safety/medical concerns         Wheelchair     Assist Is the patient using a wheelchair?: Yes Type of Wheelchair: Manual Wheelchair activity did not occur: Safety/medical concerns  Wheelchair assist level: Dependent - Patient 0% Max wheelchair distance: x150'    Wheelchair 50 feet with 2 turns activity    Assist    Wheelchair 50 feet with 2 turns activity did not occur: Safety/medical concerns   Assist Level: Dependent - Patient 0%   Wheelchair 150 feet activity     Assist  Wheelchair 150 feet activity did not occur: Safety/medical concerns   Assist Level: Dependent - Patient 0%   Blood pressure (!) 120/57, pulse 89, temperature 98.5 F (36.9 C), temperature source Oral, resp. rate 20, height 5' 10 (1.778 m), weight 124.9 kg, last menstrual period 05/17/2016, SpO2 98%.  Medical Problem List and Plan: 1. Functional deficits secondary to compression and multiple fractures of spine without SCI-intractable acute on chronic low back pain due to osteoporotic fracture involving L1, L3 and L4/acute left pubic symphysis and right sacral ala fracture.  Patient declined kyphoplasty.  Conservative management with narcotics and TLSO             -patient may  shower             -ELOS/Goals: 12-14 days- supervision to min A             -Continue CIR therapies including PT, OT   - L-spine x-ray worsening compression deformity L1 level  -Exp dc 7/1  -Reports she walked 30 feet yesterday and 70 feet today!  2.  Antithrombotics: -DVT/anticoagulation:  Pharmaceutical: Lovenox .  Check vascular study 6/13- Dopplers (-)             -antiplatelet therapy: N/A 3. Pain  Management/chronic pain followed at [facility] pain [facility]- usually takes 10 mg 6x/day: Flexeril  5 mg 3 times daily, lidoderm  patch, oxycodone  30 mg every 4 hours as needed upon transfer- attempting, with d/w pt and husband, to add MS Contin  15 mg BID and reduce Oxycodone  to 20 mg q4 hours  6/13- changed to Oxycontin  10 mg BID and continue Oxy 20 mg q4 hours prn  6/16 increase oxycontin  to 15mg  Q12h  6/17 decrease OxyContin  to 20 mg every 12 hours 4. Mood/Behavior/Sleep:  Pt has severe anxiety Cymbalta  60 mg daily, BuSpar  15 mg 3 times daily, Atarax  25 mg 3 times daily as needed             -added Celexa  10 mg daily for anxiety- is better for anxiety than Duloxetine , but need Duloxetine  for pain control  - 6/17 seen by neuropsychology for anxiety with depression, appreciate assistance             -antipsychotic agents: N/A 5. Neuropsych/cognition: This patient is ?capable of making decisions on her own behalf. 6. Skin/Wound Care: Routine skin checks 7. Fluids/Electrolytes/Nutrition: Routine in and outs with follow-up chemistries 8.  Sarcoidosis/interstitial lung disease/chronic respiratory failure with hypoxia- Was on 4-5L at home- been doing 3-4L here- and concern for hypercarbia.  Continue chronic oxygen  therapy.  Prednisone  as directed, Lasix  20 mg Monday Wednesday Fridays.  Followed by Dr. Jolyne Needs pulmonary services [facility]  6/14- some sob this morning but VS ok. ?anxiety component  -she is receiving brovana  and pulmicort  scheduled with albuterol  prn  -added IS/ and FV today  6/15 breathing better today. I asked her if she took prednisone  with hydrocortisone , and she told me she did at one point, but no longer.    -obsv for now  6/16-19 breathing doing ok- at basline, continue to monitor  Discussed Incentive spirometer SpO2: 98 % O2 Flow Rate (L/min): 4 L/min  9.  Left femoral head avascular necrosis.Probably has bilaterally, based on chart review  Not a surgical candidate.  Conservative  care  6/13- went over with therapy- that her goals from my point of view is to walk 15-25 ft, but probably not more- will not make AVN worse, but will hurt more-  10.  E. coli UTI as well as chronic urinary incontinence.  Status post 3-day course of IV Rocephin  completed.  Continue Flomax  0.4 mg daily.  Check PVR  6/14-15 pt is voiding continently. No PVR's but I'm not sure they'reneeded right now  6/16 check PVRs, pt reports she doesn't empty completely - do not see any completed yet 6/19 recheck U/A and culture for dysuria, ask nursing completed PVRs  -Start keflex - monitor cultures 11.  Normocytic anemia.  Borderline MCV of 96 and RDW of 16.  B12 and folate within normal limits.  Check CBC  6/14- CBC stable at 8.3  Order CBC 12.  Addison's disease.  Continue hydrocortisone  chronically.  Followed by endocrinology services Dr.Gebreselassie Nida 13.  Chronic hypotension.  ProAmatine  5 mg twice daily 14.  Class III morbid obesity.  BMI 39.75.  Dietary follow-up 15.  Hyperlipidemia.  Crestor  16.  Hypothyroidism.  Synthroid  17.  Chronic constipation.  Linzess  72 mcg daily, Senokot S2 tablets bedtime, Chronulac  as needed  6/13- LBM 2 days ago- she says doesn't go every day  6/15 I suspect her nausea is related to this. We also started fosamax  yesterday. Discussed how oxycodone  can cause nausea and constipation   -  KUB today reveals moderate stool burden, no ileus   -will order fleets enema   -rx nausea, increase ppi to bid   -increase senna to bid  6/16 reports good results with enema yesterday, continue to monitor   6/17 LBM 6/15 consider additional medication if no BM by then  6/18 Patient reports BM yesterday, continue to monitor   No additional BM recorded, continue to monitor  18.  GERD.  Protonix  19. Noncompliance- Per nursing, pt refusing to use TLSO for [facility] transfers, of note- will d/w pt that need to  follow recs, or there's more risk of additional fractures. 20. Hypokalemia  6/13-  will replete 40 mEq and then recheck Monday  6/18 stable K+ at 4.4 21. Azotemia  -6/18 Cr 1.04 encourage oral fluids-order placed 22. Dysphagia  -SLP consult  LOS: 7 days A FACE TO FACE EVALUATION WAS PERFORMED  Lylia Sand 02/13/2024, 12:44 PM

## 2024-02-13 NOTE — Progress Notes (Addendum)
 Physical Therapy Session Note  Patient Details  Name: Kathleen [name] MRN: [id number] Date of Birth: September 14, 1960  Today's Date: 02/13/2024 PT Individual Time: 1300-1400 PT Individual Time Calculation (min): 60 min   Short Term Goals: Week 1:  PT Short Term Goal 1 (Week 1): Pt will complete rolling in bed with supervision PT Short Term Goal 2 (Week 1): Pt will complete supine<->sit with CGA. PT Short Term Goal 3 (Week 1): Pt will complete sit to stand with CGA + LRAD PT Short Term Goal 4 (Week 1): Pt will maintain dynamic standing balance with minA + LRAD  Skilled Therapeutic Interventions/Progress Updates:    Pt in bed finishing lunch with PT arrived. Pt is ready for PT.  Pt states she needs her meds prior to PT. PT notified RN. Pt states she is going to have a BM, transferred onto [facility] from bed mod A x2. Pt unsuccessful with BM. BSC->bed min A x1 with RN.  RN needing to take an US  of pt's bladder in bed with PT's assist to support abdominal region.  Pt feels she may have a UTI.   Pt ready to begin PT, mod assist x2 (PT Tech) bed to w/c. Pt taken to W gym in w/c. Gait tx with RW with w/c following, CGA initial distance of x15', rest and SPO2 reading 90% elevating to 97% within 30. Second trial with RW x36' with w/c following. Pt SOB, SPO2 90% and increased to 99% within 45. Pt states she is too tired, but states she will do more next time. Pt is very hard on herself PT/PT tech encouraging pt along the way which pt felt more hopeful and content. Throughout session, pursed lip breathing instructed, proper sequencing for stand to sit especially with hand placement. Pt becomes anxious and will dropped into w/c.  Pt did not c/o of pain during session today. Pt returned to room and left in w/c with all needed items within reach.   Therapy Documentation Precautions:  Precautions Precautions: Fall Recall of Precautions/Restrictions: Intact Precaution/Restrictions Comments: TLSO brace (when  ambulating), pt refused to don at evaluation Required Braces or Orthoses: Spinal Brace Spinal Brace: Thoracolumbosacral orthotic Restrictions Weight Bearing Restrictions Per Provider Order: No:   Pain: 10/10 LBP      Therapy/Group: Individual Therapy  Yong Henle 02/13/2024, 3:07 PM

## 2024-02-13 NOTE — Progress Notes (Signed)
 Physical Therapy Session Note  Patient Details  Name: Kathleen [name] MRN: [id number] Date of Birth: 1961-01-23  Today's Date: 02/13/2024 PT Individual Time: 4696-2952 PT Individual Time Calculation (min): 43 min   Short Term Goals: Week 1:  PT Short Term Goal 1 (Week 1): Pt will complete rolling in bed with supervision PT Short Term Goal 2 (Week 1): Pt will complete supine<->sit with CGA. PT Short Term Goal 3 (Week 1): Pt will complete sit to stand with CGA + LRAD PT Short Term Goal 4 (Week 1): Pt will maintain dynamic standing balance with minA + LRAD  Skilled Therapeutic Interventions/Progress Updates:    Pt asleep when PT arrived, pt willing to participate with PT.  RN present for med dispense, however, pt requested to take meds after PT.  Pt able to transfer supine->sit @EOB  with railing and gait belt tied to bed used to pull herself up. Pt requesting to wash face/brush her teeth and transferred from bed->w/c with min assist using RW.  Pt continues to require cueing for hand placement on w/c when sitting and control descent.  Pt wheeled to gym to conserve energy, pulling oxygen  5L/min via [place]. Sit->stand transfer from w/c to RW mod assist x2.   Pt ambulated with RW, CGA x2 trials: first x27.9', second x50'. Pt requires rest break between trials. SPO2 90%-98%. Pt did not demonstrate impulsiveness and decreased anxiety today.  Pt states her goal is to walk from her room to the gym and says, I have goals!.  Pt making good gains towards goals. Pt c/o 20/10 back pain, Feels like someone put a rod in my back!  Pt returned to room in w/c all items within reach.   Therapy Documentation Precautions:  Precautions Precautions: Fall Recall of Precautions/Restrictions: Intact Precaution/Restrictions Comments: TLSO brace (when ambulating), pt refused to don at evaluation Required Braces or Orthoses: Spinal Brace Spinal Brace: Thoracolumbosacral orthotic Restrictions Weight Bearing Restrictions Per  Provider Order: No  Pain: Pain Assessment Pain Scale: 0-10 Pain Score: 20-Worst pain ever Pain Location: Back    Therapy/Group: Individual Therapy  Yong Henle 02/13/2024, 12:11 PM

## 2024-02-14 ENCOUNTER — Ambulatory Visit: Admitting: Urology

## 2024-02-14 ENCOUNTER — Inpatient Hospital Stay (HOSPITAL_COMMUNITY)

## 2024-02-14 DIAGNOSIS — N390 Urinary tract infection, site not specified: Secondary | ICD-10-CM

## 2024-02-14 MED ORDER — SORBITOL 70 % SOLN
60.0000 mL | Freq: Once | Status: AC
  Administered 2024-02-14: 60 mL via ORAL
  Filled 2024-02-14: qty 60

## 2024-02-14 MED ORDER — LEVOFLOXACIN 750 MG PO TABS
750.0000 mg | ORAL_TABLET | Freq: Every day | ORAL | Status: DC
  Administered 2024-02-14 – 2024-02-15 (×2): 750 mg via ORAL
  Filled 2024-02-14 (×2): qty 1

## 2024-02-14 MED ORDER — FLEET ENEMA RE ENEM
1.0000 | ENEMA | Freq: Every day | RECTAL | Status: DC | PRN
  Administered 2024-02-14 – 2024-02-20 (×2): 1 via RECTAL
  Filled 2024-02-14 (×2): qty 1

## 2024-02-14 MED ORDER — POLYETHYLENE GLYCOL 3350 17 G PO PACK
17.0000 g | PACK | Freq: Every day | ORAL | Status: DC
  Filled 2024-02-14: qty 1

## 2024-02-14 MED ORDER — POLYETHYLENE GLYCOL 3350 17 G PO PACK: 17.0000 g | PACK | Freq: Every day | ORAL | Status: DC

## 2024-02-14 NOTE — Progress Notes (Addendum)
 PROGRESS NOTE   Subjective/Complaints: PT reports some nausea and constipation this AM. No BM in about 2 days.   ROS: Patient denies fever, rash, vomiting, diarrhea, shortness of breath or chest pain,headache  + back pain + anxiety- a little improved +dysuria + constipation   Objective:   No results found.  Recent Labs    02/11/24 1633  WBC 8.6  HGB 8.5*  HCT 29.2*  PLT 472*    Recent Labs    02/11/24 1633  NA 139  K 4.4  CL 98  CO2 33*  GLUCOSE 126*  BUN <5*  CREATININE 1.04*  CALCIUM  8.7*     Intake/Output Summary (Last 24 hours) at 02/14/2024 1433 Last data filed at 02/13/2024 1952 Gross per 24 hour  Intake 600 ml  Output 175 ml  Net 425 ml        Physical Exam: Vital Signs Blood pressure (!) 117/50, pulse 89, temperature 98.1 F (36.7 C), temperature source Oral, resp. rate 18, height 5' 10 (1.778 m), weight 124.9 kg, last menstrual period 05/17/2016, SpO2 96%.   Constitutional: No distress . Vital signs reviewed.  Laying in bed HEENT: NCAT, EOMI, oral membranes moist  Neck: supple Cardiovascular: RRR without murmur. No JVD    Respiratory/Chest: CTA Bilaterally without wheezes or rales.  Non-labored, [place] o2 SpO2: 96 % O2 Flow Rate (L/min): 4 L/min  GI/Abdomen: BS +, non-tender, distended Ext: no clubbing, cyanosis, or edema Psych: pleasant and cooperative  Musculoskeletal:     Cervical and low back: Tenderness present.     Comments: ongoing LE edema to ankles-  Ue's5-/5 in Deltoids, biceps, triceps; WE 4/5- give way weakness; Grip 4+/5 and FA 4+/5 B/L LE's- 5-/5 throughout B/L    Skin:    General: Skin is warm and dry.         Neurological:     Comments: Sleeping in bed, wakes to voice but a little drowsy   Oriented x 3 and follows commands. Good insight and awareness.   Moves all 4 limbs. CN exam non-focal. Fair insight Intact to light touch in all 4 extremities   Prior  neuro assessment is c/w today's exam 02/14/2024.    Assessment/Plan: 1. Functional deficits which require 3+ hours per day of interdisciplinary therapy in a comprehensive inpatient rehab setting. Physiatrist is providing close team supervision and 24 hour management of active medical problems listed below. Physiatrist and rehab team continue to assess barriers to discharge/monitor patient progress toward functional and medical goals  Care Tool:  Bathing    Body parts bathed by patient: Left arm, Right arm, Abdomen, Chest, Face   Body parts bathed by helper: Front perineal area, Buttocks, Right upper leg, Left upper leg, Right lower leg, Left lower leg     Bathing assist Assist Level: Maximal Assistance - Patient 24 - 49%     Upper Body Dressing/Undressing Upper body dressing   What is the patient wearing?: Orthosis Orthosis activity level: Performed by helper  Upper body assist Assist Level: Total Assistance - Patient < 25%    Lower Body Dressing/Undressing Lower body dressing      What is the patient wearing?: Incontinence brief, Pants  Lower body assist Assist for lower body dressing: Total Assistance - Patient < 25%     Toileting Toileting    Toileting assist Assist for toileting: Total Assistance - Patient < 25%     Transfers Chair/bed transfer  Transfers assist     Chair/bed transfer assist level: Contact Guard/Touching assist     Locomotion Ambulation   Ambulation assist      Assist level: Contact Guard/Touching assist Assistive device: Walker-rolling Max distance: x15', x36'   Walk 10 feet activity   Assist  Walk 10 feet activity did not occur: Safety/medical concerns  Assist level: Contact Guard/Touching assist Assistive device: Walker-rolling   Walk 50 feet activity   Assist Walk 50 feet with 2 turns activity did not occur: Safety/medical concerns  Assist level: Contact Guard/Touching assist Assistive device: Walker-rolling     Walk 150 feet activity   Assist Walk 150 feet activity did not occur: Safety/medical concerns (pt too fatigued, SOB)         Walk 10 feet on uneven surface  activity   Assist Walk 10 feet on uneven surfaces activity did not occur: Safety/medical concerns         Wheelchair     Assist Is the patient using a wheelchair?: Yes Type of Wheelchair: Manual Wheelchair activity did not occur: Safety/medical concerns  Wheelchair assist level: Dependent - Patient 0% Max wheelchair distance: 2x220'    Wheelchair 50 feet with 2 turns activity    Assist    Wheelchair 50 feet with 2 turns activity did not occur: Safety/medical concerns   Assist Level: Dependent - Patient 0%   Wheelchair 150 feet activity     Assist  Wheelchair 150 feet activity did not occur: Safety/medical concerns   Assist Level: Dependent - Patient 0%   Blood pressure (!) 117/50, pulse 89, temperature 98.1 F (36.7 C), temperature source Oral, resp. rate 18, height 5' 10 (1.778 m), weight 124.9 kg, last menstrual period 05/17/2016, SpO2 96%.  Medical Problem List and Plan: 1. Functional deficits secondary to compression and multiple fractures of spine without SCI-intractable acute on chronic low back pain due to osteoporotic fracture involving L1, L3 and L4/acute left pubic symphysis and right sacral ala fracture.  Patient declined kyphoplasty.  Conservative management with narcotics and TLSO             -patient may  shower             -ELOS/Goals: 12-14 days- supervision to min A             -Continue CIR therapies including PT, OT   - L-spine x-ray worsening compression deformity L1 level  -Exp dc 7/1   2.  Antithrombotics: -DVT/anticoagulation:  Pharmaceutical: Lovenox .  Check vascular study 6/13- Dopplers (-)             -antiplatelet therapy: N/A 3. Pain Management/chronic pain followed at [facility] pain clinic- usually takes 10 mg 6x/day: Flexeril  5 mg 3 times daily, lidoderm   patch, oxycodone  30 mg every 4 hours as needed upon transfer- attempting, with d/w pt and husband, to add MS Contin  15 mg BID and reduce Oxycodone  to 20 mg q4 hours  6/13- changed to Oxycontin  10 mg BID and continue Oxy 20 mg q4 hours prn  6/16 increase oxycontin  to 15mg  Q12h  6/17 Increase OxyContin  to 20 mg every 12 hours 4. Mood/Behavior/Sleep:  Pt has severe anxiety Cymbalta  60 mg daily, BuSpar  15 mg 3 times daily, Atarax  25 mg 3 times daily as  needed             -added Celexa  10 mg daily for anxiety- is better for anxiety than Duloxetine , but need Duloxetine  for pain control  - 6/17 seen by neuropsychology for anxiety with depression, appreciate assistance             -antipsychotic agents: N/A 5. Neuropsych/cognition: This patient is ?capable of making decisions on her own behalf. 6. Skin/Wound Care: Routine skin checks 7. Fluids/Electrolytes/Nutrition: Routine in and outs with follow-up chemistries 8.  Sarcoidosis/interstitial lung disease/chronic respiratory failure with hypoxia- Was on 4-5L at home- been doing 3-4L here- and concern for hypercarbia.  Continue chronic oxygen  therapy.  Prednisone  as directed, Lasix  20 mg Monday Wednesday Fridays.  Followed by Dr. Selinda Moots pulmonary services [facility]  6/14- some sob this morning but VS ok. ?anxiety component  -she is receiving brovana  and pulmicort  scheduled with albuterol  prn  -added IS/ and FV today  6/15 breathing better today. I asked her if she took prednisone  with hydrocortisone , and she told me she did at one point, but no longer.    -obsv for now  breathing doing ok- at basline, continue to monitor  Discussed Incentive spirometer SpO2: 96 % O2 Flow Rate (L/min): 4 L/min  9.  Left femoral head avascular necrosis.Probably has bilaterally, based on chart review  Not a surgical candidate.  Conservative care  6/13- went over with therapy- that her goals from my point of view is to walk 15-25 ft, but probably not more- will not make AVN  worse, but will hurt more-  10.  E. coli UTI as well as chronic urinary incontinence.  Status post 3-day course of IV Rocephin  completed.  Continue Flomax  0.4 mg daily.  Check PVR  6/14-15 pt is voiding continently. No PVR's but I'm not sure they'reneeded right now  6/16 check PVRs, pt reports she doesn't empty completely - do not see any completed yet 6/19 recheck U/A and culture for dysuria, ask nursing completed PVRs  -Start keflex - monitor cultures- pseudomonas- sensitivities pending- Change to Levofloxacin  11.  Normocytic anemia.  Borderline MCV of 96 and RDW of 16.  B12 and folate within normal limits.  Check CBC  6/14- CBC HGB stable at 8.3  6/17  HGB stable 8.5 12.  Addison's disease.  Continue hydrocortisone  chronically.  Followed by endocrinology services Dr.Gebreselassie Nida 13.  Chronic hypotension.  ProAmatine  5 mg twice daily    02/14/2024    2:15 PM 02/14/2024    9:08 AM 02/14/2024    6:10 AM  Vitals with BMI  Systolic 117 120 879  Diastolic 50 75 98  Pulse 89 90 94    14.  Class III morbid obesity.  BMI 39.75.  Dietary follow-up 15.  Hyperlipidemia.  Crestor  16.  Hypothyroidism.  Synthroid  17.  Chronic constipation.  Linzess  72 mcg daily, Senokot S2 tablets bedtime, Chronulac  as needed  6/13- LBM 2 days ago- she says doesn't go every day  6/15 I suspect her nausea is related to this. We also started fosamax  yesterday. Discussed how oxycodone  can cause nausea and constipation   -  KUB today reveals moderate stool burden, no ileus   -will order fleets enema   -rx nausea, increase ppi to bid   -increase senna to bid  6/16 reports good results with enema yesterday, continue to monitor   6/17 LBM 6/15 consider additional medication if no BM by then  6/18 Patient reports BM yesterday, continue to monitor   6/20 Sorbitol   ordered this AM- no results will order xray abdomen and Enema- discussed with nursing- had BM with this, add miralax  18.  GERD.  Protonix  19.  Noncompliance- Per nursing, pt refusing to use TLSO for [facility] transfers, of note- will d/w pt that need to follow recs, or there's more risk of additional fractures. 20. Hypokalemia  6/13- will replete 40 mEq and then recheck Monday  6/18 stable K+ at 4.4 21. Azotemia  -6/18 Cr 1.04 encourage oral fluids-order placed  Recheck tomorrow  22. Dysphagia  -SLP consult pending   Addendum,  checked Xray abdomen with  some dilated central small bowel, possible ileus versus partial small bowel obstruction. Nursing documents  few Bms today with intervention.  Will change to NPO for now. Consider IVF tomorrow if continued.SABRA Bern xray tomorrow AM. Recheck labs ordered.  Consider surgical consult tomorrow if worsening.   LOS: 8 days A FACE TO FACE EVALUATION WAS PERFORMED  Murray Collier 02/14/2024, 2:33 PM

## 2024-02-14 NOTE — Progress Notes (Signed)
 SLP Cancellation Note  Patient Details Name: Kathleen [name] MRN: [id number] DOB: Nov 21, 1960   Cancelled treatment:  Attempted to complete bedside swallow eval, though unable to participate d/t emesis. Will follow up 6/21 as appropriate.    Rozell Cornet 02/14/2024, 5:05 PM

## 2024-02-14 NOTE — Plan of Care (Signed)
  Problem: Consults Goal: RH SPINAL CORD INJURY PATIENT EDUCATION Description:  See Patient Education module for education specifics.  Outcome: Progressing   Problem: SCI BOWEL ELIMINATION Goal: RH STG MANAGE BOWEL WITH ASSISTANCE Description: STG Manage Bowel with toileting Assistance. Outcome: Progressing Goal: RH STG SCI MANAGE BOWEL WITH MEDICATION WITH ASSISTANCE Description: STG SCI Manage bowel with medication with mod I assistance. Outcome: Progressing Goal: RH STG SCI MANAGE BOWEL PROGRAM W/ASSIST OR AS APPROPRIATE Description: STG SCI Manage bowel program w/assist or as appropriate. Outcome: Progressing   Problem: SCI BLADDER ELIMINATION Goal: RH STG MANAGE BLADDER WITH ASSISTANCE Description: STG Manage Bladder With Assistance Outcome: Progressing Goal: RH STG MANAGE BLADDER WITH MEDICATION WITH ASSISTANCE Description: STG Manage Bladder With Medication With mod I Assistance. Outcome: Progressing Goal: RH STG SCI MANAGE BLADDER PROGRAM W/ASSISTANCE Description: Toileting protocol Outcome: Progressing   Problem: RH SAFETY Goal: RH STG ADHERE TO SAFETY PRECAUTIONS W/ASSISTANCE/DEVICE Description: STG Adhere to Safety Precautions With cues Assistance/Device. Outcome: Progressing   Problem: RH KNOWLEDGE DEFICIT SCI Goal: RH STG INCREASE KNOWLEDGE OF SELF CARE AFTER SCI Description: Patient and spouse will be able to manage care at discharge using educational resources for medication, skin care, transfers, toileting, dietary modification independently Outcome: Progressing

## 2024-02-14 NOTE — Plan of Care (Signed)
  Problem: SCI BOWEL ELIMINATION Goal: RH STG MANAGE BOWEL WITH ASSISTANCE Description: STG Manage Bowel with toileting Assistance. Outcome: Progressing Goal: RH STG SCI MANAGE BOWEL WITH MEDICATION WITH ASSISTANCE Description: STG SCI Manage bowel with medication with mod I assistance. Outcome: Progressing Goal: RH STG SCI MANAGE BOWEL PROGRAM W/ASSIST OR AS APPROPRIATE Description: STG SCI Manage bowel program w/assist or as appropriate. Outcome: Progressing   Problem: SCI BLADDER ELIMINATION Goal: RH STG MANAGE BLADDER WITH ASSISTANCE Description: STG Manage Bladder With Assistance Outcome: Progressing Goal: RH STG MANAGE BLADDER WITH MEDICATION WITH ASSISTANCE Description: STG Manage Bladder With Medication With mod I Assistance. Outcome: Progressing Goal: RH STG SCI MANAGE BLADDER PROGRAM W/ASSISTANCE Description: Toileting protocol Outcome: Progressing   Problem: RH SAFETY Goal: RH STG ADHERE TO SAFETY PRECAUTIONS W/ASSISTANCE/DEVICE Description: STG Adhere to Safety Precautions With cues Assistance/Device. Outcome: Progressing   Problem: RH KNOWLEDGE DEFICIT SCI Goal: RH STG INCREASE KNOWLEDGE OF SELF CARE AFTER SCI Description: Patient and spouse will be able to manage care at discharge using educational resources for medication, skin care, transfers, toileting, dietary modification independently Outcome: Progressing

## 2024-02-14 NOTE — Progress Notes (Signed)
 Occupational Therapy Session Note  Patient Details  Name: Kathleen [name] MRN: [id number] Date of Birth: [birth year]  Today's Date: 02/14/2024 OT Individual Time: 1040-1140 OT Individual Time Calculation (min): 60 min    Short Term Goals: Week 1:  OT Short Term Goal 1 (Week 1): Pt will perform stand pivot transfer to the toilet/ BSC with RW with min A consistently OT Short Term Goal 2 (Week 1): Pt will perform toileting with max A 2/3 steps OT Short Term Goal 3 (Week 1): Pt will perform bed mobility with min A from flat bed with bed rails OT Short Term Goal 4 (Week 1): Pt will be able to don LB clothing (underwear/ pants ) with mi nA  Skilled Therapeutic Interventions/Progress Updates:    Pt greeted semi-reclined in bed asleep and breathing heavy. Pt unable to keep eyes open initially, shivering and sweating in the bed. Discussed with nursing. With nursing assistance, pt woke and wanted to work with therapy. Pt completed bed mobility with HOB elevated and supervision 4L of O2 throughout session. Pt sat EOB then reported urgent need to go to the bathroom. Stand-pivot to [facility] with RW and min A. Pt declined to wear brace to [facility] but agreeable to put it on after washing up. Pt voided bladder and needed assistance for peri-care in standing. Pt then stood with min A and RW an pivoted to wc. Bathing/dressing completed from wc at the sink with increased time and multiple rest breaks. Educated pt on use of reacher to assist with threading pant legs. Pt reported feeling nausea and had episode of dry heaving. Nursing brought in nausea meds. Sit<>stand to pull up pants with min A to stand and max A to get up pants. Pt requested to stay up in wc. Nursing present and needs met.   Therapy Documentation Precautions:  Precautions Precautions: Fall Recall of Precautions/Restrictions: Intact Precaution/Restrictions Comments: TLSO brace (when ambulating), pt refused to don at evaluation Required Braces or Orthoses:  Spinal Brace Spinal Brace: Thoracolumbosacral orthotic Restrictions Weight Bearing Restrictions Per Provider Order: No  Pain: Pain Assessment Pain Scale: 0-10 Pain Score: 10-Worst pain ever Pain Location: Back   Therapy/Group: Individual Therapy  Lethia Raveling 02/14/2024, 11:25 AM

## 2024-02-14 NOTE — Progress Notes (Addendum)
 Pt given fleet enema and tolerated well resulting in a large BM. Pt still reports having abdomen discomfort and feels like she has not fully emptied out her bowels.

## 2024-02-14 NOTE — Progress Notes (Signed)
 Physical Therapy Weekly Progress Note  Patient Details  Name: Kathleen [name] MRN: [id number] Date of Birth: [birth year]  Beginning of progress report period: February 06 2024 End of progress report period: February 14, 2024  Today's Date: 02/14/2024 PT Individual Time: 1300-1400 PT Individual Time Calculation (min): 60 min   Patient has met  2 of 5 short term goals.  Pt demonstrating improved endurance with mobility ambulating x50' max with RW CGA, bed mobility with rolling supervision using railings, overall strength improved during course of skilled PT. Pt's goal is to return to supervision with mobility to return home using LRAD. Pt c/o pain prior to PT sessions, however, does not verbalize during sessions.   Patient continues to demonstrate the following deficits muscle weakness and muscle joint tightness and decreased cardiorespiratoy endurance and decreased oxygen  support and therefore will continue to benefit from skilled PT intervention to increase functional independence with mobility.  Patient progressing toward long term goals..  Continue plan of care.  PT Short Term Goals Week 1:  PT Short Term Goal 1 (Week 1): Pt will complete rolling in bed with supervision PT Short Term Goal 1 - Progress (Week 1): Met PT Short Term Goal 2 (Week 1): Pt will complete supine<->sit with CGA. PT Short Term Goal 2 - Progress (Week 1): Met PT Short Term Goal 3 (Week 1): Pt will complete sit to stand with CGA + LRAD PT Short Term Goal 3 - Progress (Week 1): Progressing toward goal PT Short Term Goal 4 (Week 1): Pt will maintain dynamic standing balance with minA + LRAD PT Short Term Goal 4 - Progress (Week 1): Progressing toward goal PT Short Term Goal 5 (Week 1): Pt will ambulate x75' with LRAD CGA Week 2:  PT Short Term Goal 1 (Week 2): Pt will maintain static standing balance up to x3' with LRAD  Skilled Therapeutic Interventions/Progress Updates:  Session 1: Pt sitting up in w/c when PT arrived, pt  motivated for PT session.  Pt c/o feeling constipated and RN notified.  Pt requesting an enema rather than suppository and RN was notified. Pt wheeled to [employer] for energy conservation.  Sit to stand xfer with mod assist x2 from w/c to RW.  Gait tx with RW x3 trials.  First trial pt ambulated x33, second trial x25, third trial x15. Pt requires rest break between and vc for pursed lip breathing.  SPO2 90-97%.  Pt does not feel the reading is accurate stating the unit in her room more accurate. Pt returned to room, sit to stand to RW from wc mod assist x2, RW->bed, supervision, sit->supine supervision, max assist x2 to position pt to [facility], rolling side to side supervision using railing. Pt left in bed comfortable to all items within reach.   Therapy Documentation Precautions:  Precautions Precautions: Fall Recall of Precautions/Restrictions: Intact Precaution/Restrictions Comments: TLSO brace (when ambulating), pt refused to don at evaluation Required Braces or Orthoses: Spinal Brace Spinal Brace: Thoracolumbosacral orthotic Restrictions Weight Bearing Restrictions Per Provider Order: No  Pain: Pt c/o 10/10 pain  Pain Assessment Pain Scale: 0-10 Pain Score: 10-Worst pain ever Pain Location: Back    Therapy/Group: Individual Therapy  Yong Henle 02/14/2024, 3:55 PM

## 2024-02-14 NOTE — Progress Notes (Signed)
 Physical Therapy Note  Patient Details  Name: Kathleen [name] MRN: [id number] Date of Birth: [birth year] Today's Date: 02/14/2024    Pt missed 60 minutes due to pt not feeling well.  PT will attempt another visit later today if time allowed.   [name] Kathleen [name] 02/14/2024, 9:19 AM

## 2024-02-15 ENCOUNTER — Inpatient Hospital Stay (HOSPITAL_COMMUNITY)

## 2024-02-15 DIAGNOSIS — S129XXS Fracture of neck, unspecified, sequela: Secondary | ICD-10-CM

## 2024-02-15 LAB — BASIC METABOLIC PANEL WITH GFR
Anion gap: 8 (ref 5–15)
BUN: <5 mg/dL — ABNORMAL LOW (ref 8–23)
CO2: 35 mmol/L — ABNORMAL HIGH (ref 22–32)
Calcium: 8.5 mg/dL — ABNORMAL LOW (ref 8.9–10.3)
Chloride: 97 mmol/L — ABNORMAL LOW (ref 98–111)
Creatinine, Ser: 0.83 mg/dL (ref 0.44–1.00)
GFR, Estimated: >60 mL/min (ref >60)
Glucose, Bld: 92 mg/dL (ref 70–99)
Potassium: 3.5 mmol/L (ref 3.5–5.1)
Sodium: 140 mmol/L (ref 135–145)

## 2024-02-15 LAB — CBC
HCT: 29.4 % — ABNORMAL LOW (ref 36.0–46.0)
Hemoglobin: 8.5 g/dL — ABNORMAL LOW (ref 12.0–15.0)
MCH: 28.1 pg (ref 26.0–34.0)
MCHC: 28.9 g/dL — ABNORMAL LOW (ref 30.0–36.0)
MCV: 97.4 fL (ref 80.0–100.0)
Platelets: 448 10*3/uL — ABNORMAL HIGH (ref 150–400)
RBC: 3.02 MIL/uL — ABNORMAL LOW (ref 3.87–5.11)
RDW: 16.2 % — ABNORMAL HIGH (ref 11.5–15.5)
WBC: 7.9 10*3/uL (ref 4.0–10.5)
nRBC: 0 % (ref 0.0–0.2)

## 2024-02-15 LAB — URINE CULTURE: Culture: >=100000 — AB

## 2024-02-15 MED ORDER — SODIUM CHLORIDE 0.9 % IV SOLN
2.0000 g | Freq: Three times a day (TID) | INTRAVENOUS | Status: DC
  Administered 2024-02-16 – 2024-02-19 (×10): 2 g via INTRAVENOUS
  Filled 2024-02-15 (×11): qty 12.5

## 2024-02-15 MED ORDER — POLYETHYLENE GLYCOL 3350 17 G PO PACK
17.0000 g | PACK | Freq: Two times a day (BID) | ORAL | Status: DC
  Administered 2024-02-15 – 2024-02-18 (×6): 17 g via ORAL
  Filled 2024-02-15 (×6): qty 1

## 2024-02-15 MED ORDER — SODIUM CHLORIDE 0.9 % IV SOLN: INTRAVENOUS | Status: DC

## 2024-02-15 MED ORDER — SODIUM CHLORIDE 0.9% FLUSH
10.0000 mL | Freq: Two times a day (BID) | INTRAVENOUS | Status: DC
  Administered 2024-02-15 – 2024-02-22 (×13): 10 mL

## 2024-02-15 MED ORDER — SODIUM CHLORIDE 0.9% FLUSH: 10.0000 mL | INTRAVENOUS | Status: DC | PRN

## 2024-02-15 NOTE — Progress Notes (Signed)
   02/15/24 1359  Pain Assessment  Pain Scale 0-10  Pain Score 10  Pain Type Acute pain  Pain Location Back  Pain Orientation Lower  Pain Descriptors / Indicators Aching;Sharp;Discomfort  Pain Frequency Constant  Pain Onset On-going  Patients Stated Pain Goal 3  Pain Intervention(s) Medication (See eMAR)  Multiple Pain Sites No   Patient was requesting to transfer to the [facility] and was a lot of pain, as well patient states they thought they were having a panick attack, patient given prn medication for pain and anxiety, RN notified,

## 2024-02-15 NOTE — Progress Notes (Signed)
 Occupational Therapy Session Note  Patient Details  Name: Kathleen [name] MRN: [id number] Date of Birth: May 24, 1961  Today's Date: 02/15/2024 OT Individual Time: 1040-1100 OT Individual Time Calculation (min): 20 min    Short Term Goals: Week 1:  OT Short Term Goal 1 (Week 1): Pt will perform stand pivot transfer to the toilet/ BSC with RW with min A consistently OT Short Term Goal 2 (Week 1): Pt will perform toileting with max A 2/3 steps OT Short Term Goal 3 (Week 1): Pt will perform bed mobility with min A from flat bed with bed rails OT Short Term Goal 4 (Week 1): Pt will be able to don LB clothing (underwear/ pants ) with mi nA  Skilled Therapeutic Interventions/Progress Updates:    Patient in bed with family and NT present at the time of arrival.  Patient indicated that she had difficulty resting secondary to constipation. Patient went on to say that her bowels moved on yesterday, but not today.  The pt reported a pain response of 10 on a 0-10 for back pain and that nursing had provided medication. The pt was able to wash her face with suA  and she was able to brush her teeth at the  same LOF.The pt was assisted with repositioning in bed with MaxA.  I arrived at 10;40AM secondary to working with the previous pt and I informed that pt that her time would be adjusted on next week. Prior to exiting the room, the call light and bedside table were placed within reach all additional needs were addressed prior to exiting the room.    Therapy Documentation Precautions:  Precautions Precautions: Fall Recall of Precautions/Restrictions: Intact Precaution/Restrictions Comments: TLSO brace (when ambulating), pt refused to don at evaluation Required Braces or Orthoses: Spinal Brace Spinal Brace: Thoracolumbosacral orthotic Restrictions Weight Bearing Restrictions Per Provider Order: No Therapy/Group: Individual Therapy and Co-Treatment  Kathleen [name] 02/15/2024, 3:48 PM

## 2024-02-15 NOTE — Progress Notes (Signed)
 PROGRESS NOTE   Subjective/Complaints: No events overnight. Patient perseverating on needing to pee - states diffuse abdominal pain, passing gas but no BM today.  Vitals with diastolic hypotension; respiratory rate 18-22 overnight; on 4 L nasal cannula    02/15/2024    9:28 AM 02/15/2024    5:28 AM 02/14/2024    8:01 PM  Vitals with BMI  Systolic  127 108  Diastolic  49 45  Pulse 89 87 85    No results for input(s): GLUCAP in the last 72 hours.  BUN/creatinine improved N.p.o. due to concerns of bowel obstruction yesterday; abdominal x-ray pending Continent of bladder with low PVRs Last BM 6/20, medium, type I  ROS: Patient denies fever, rash, vomiting, diarrhea, shortness of breath or chest pain,headache  + back pain + anxiety- a little improved + dysuria + constipation   Objective:   DG Abd 1 View Result Date: 02/14/2024 CLINICAL DATA:  Constipation EXAM: ABDOMEN - 1 VIEW COMPARISON:  02/09/2024 FINDINGS: Limited by motion and body habitus as well as rectangular artifact obscuring the left mid abdomen. Suspicion of some dilated central small bowel measuring to 4.7 cm. Some gas in the colon is observed IMPRESSION: Limited by motion and body habitus. Suspicion of some dilated central small bowel, ileus versus partial small bowel obstruction. Electronically Signed   By: Luke Bun M.D.   On: 02/14/2024 19:52    Recent Labs    02/15/24 0720  WBC 7.9  HGB 8.5*  HCT 29.4*  PLT 448*    Recent Labs    02/15/24 0720  NA 140  K 3.5  CL 97*  CO2 35*  GLUCOSE 92  BUN <5*  CREATININE 0.83  CALCIUM  8.5*     Intake/Output Summary (Last 24 hours) at 02/15/2024 1009 Last data filed at 02/14/2024 2030 Gross per 24 hour  Intake 864 ml  Output --  Net 864 ml        Physical Exam: Vital Signs Blood pressure (!) 127/49, pulse 89, temperature 97.9 F (36.6 C), temperature source Oral, resp. rate (!) 22, height  5' 10 (1.778 m), weight 124.9 kg, last menstrual period 05/17/2016, SpO2 97%.   Constitutional: Mild distress . Vital signs reviewed.  Laying in bed HEENT: NCAT, EOMI, oral membranes moist  Neck: supple Cardiovascular: RRR without murmur. No JVD    Respiratory/Chest: CTA Bilaterally without wheezes or rales.  Non-labored, [place] o2 SpO2: 97 % O2 Flow Rate (L/min): 4 L/min  GI/Abdomen: BS hypoactive in all 4 quadrants, distended, tender diffusely to palpation. No rigidity or gaurding.   Ext: no clubbing, cyanosis, or edema Psych: pleasant and cooperative  Musculoskeletal:   ongoing LE edema to ankles-  Ue's5-/5 in Deltoids, biceps, triceps; WE 4/5- give way weakness; Grip 4+/5 and FA 4+/5 B/L LE's- 5-/5 throughout B/L    Skin:    General: Skin is warm and dry.    Neurological:     Comments:  Oriented x 3 and follows commands. Good insight and awareness.   Moves all 4 limbs. CN exam non-focal. Fair insight Intact to light touch in all 4 extremities    Assessment/Plan: 1. Functional deficits which require 3+ hours per  day of interdisciplinary therapy in a comprehensive inpatient rehab setting. Physiatrist is providing close team supervision and 24 hour management of active medical problems listed below. Physiatrist and rehab team continue to assess barriers to discharge/monitor patient progress toward functional and medical goals  Care Tool:  Bathing    Body parts bathed by patient: Left arm, Right arm, Abdomen, Chest, Face   Body parts bathed by helper: Front perineal area, Buttocks, Right upper leg, Left upper leg, Right lower leg, Left lower leg     Bathing assist Assist Level: Maximal Assistance - Patient 24 - 49%     Upper Body Dressing/Undressing Upper body dressing   What is the patient wearing?: Orthosis Orthosis activity level: Performed by helper  Upper body assist Assist Level: Total Assistance - Patient < 25%    Lower Body Dressing/Undressing Lower body  dressing      What is the patient wearing?: Incontinence brief, Pants     Lower body assist Assist for lower body dressing: Total Assistance - Patient < 25%     Toileting Toileting    Toileting assist Assist for toileting: Total Assistance - Patient < 25%     Transfers Chair/bed transfer  Transfers assist     Chair/bed transfer assist level: Contact Guard/Touching assist     Locomotion Ambulation   Ambulation assist      Assist level: Contact Guard/Touching assist Assistive device: Walker-rolling Max distance: x88'   Walk 10 feet activity   Assist  Walk 10 feet activity did not occur: Safety/medical concerns  Assist level: Contact Guard/Touching assist Assistive device: Walker-rolling   Walk 50 feet activity   Assist Walk 50 feet with 2 turns activity did not occur: Safety/medical concerns  Assist level: Contact Guard/Touching assist Assistive device: Walker-rolling    Walk 150 feet activity   Assist Walk 150 feet activity did not occur: Safety/medical concerns (pt too fatigued, SOB)         Walk 10 feet on uneven surface  activity   Assist Walk 10 feet on uneven surfaces activity did not occur: Safety/medical concerns         Wheelchair     Assist Is the patient using a wheelchair?: Yes Type of Wheelchair: Manual Wheelchair activity did not occur: Safety/medical concerns  Wheelchair assist level: Dependent - Patient 0% Max wheelchair distance: 2x220'    Wheelchair 50 feet with 2 turns activity    Assist    Wheelchair 50 feet with 2 turns activity did not occur: Safety/medical concerns   Assist Level: Dependent - Patient 0%   Wheelchair 150 feet activity     Assist  Wheelchair 150 feet activity did not occur: Safety/medical concerns   Assist Level: Dependent - Patient 0%   Blood pressure (!) 127/49, pulse 89, temperature 97.9 F (36.6 C), temperature source Oral, resp. rate (!) 22, height 5' 10 (1.778 m),  weight 124.9 kg, last menstrual period 05/17/2016, SpO2 97%.  Medical Problem List and Plan: 1. Functional deficits secondary to compression and multiple fractures of spine without SCI-intractable acute on chronic low back pain due to osteoporotic fracture involving L1, L3 and L4/acute left pubic symphysis and right sacral ala fracture.  Patient declined kyphoplasty.  Conservative management with narcotics and TLSO             -patient may  shower             -ELOS/Goals: 12-14 days- supervision to min A             -  Continue CIR therapies including PT, OT   - L-spine x-ray worsening compression deformity L1 level  -Exp dc 7/1   2.  Antithrombotics: -DVT/anticoagulation:  Pharmaceutical: Lovenox .  Check vascular study 6/13- Dopplers (-)             -antiplatelet therapy: N/A 3. Pain Management/chronic pain followed at [facility]- usually takes 10 mg 6x/day: Flexeril  5 mg 3 times daily, lidoderm  patch, oxycodone  30 mg every 4 hours as needed upon transfer- attempting, with d/w pt and husband, to add MS Contin  15 mg BID and reduce Oxycodone  to 20 mg q4 hours  6/13- changed to Oxycontin  10 mg BID and continue Oxy 20 mg q4 hours prn  6/16 increase oxycontin  to 15mg  Q12h  6/17 Increase OxyContin  to 20 mg every 12 hours 4. Mood/Behavior/Sleep:  Pt has severe anxiety Cymbalta  60 mg daily, BuSpar  15 mg 3 times daily, Atarax  25 mg 3 times daily as needed             -added Celexa  10 mg daily for anxiety- is better for anxiety than Duloxetine , but need Duloxetine  for pain control  - 6/17 seen by neuropsychology for anxiety with depression, appreciate assistance             -antipsychotic agents: N/A  5. Neuropsych/cognition: This patient is ?capable of making decisions on her own behalf. 6. Skin/Wound Care: Routine skin checks 7. Fluids/Electrolytes/Nutrition: Routine in and outs with follow-up chemistries 8.  Sarcoidosis/interstitial lung disease/chronic respiratory failure with hypoxia-  Was on 4-5L at home- been doing 3-4L here- and concern for hypercarbia.  Continue chronic oxygen  therapy.  Prednisone  as directed, Lasix  20 mg Monday Wednesday Fridays.  Followed by Dr. Selinda Moots pulmonary services [facility]  6/14- some sob this morning but VS ok. ?anxiety component  -she is receiving brovana  and pulmicort  scheduled with albuterol  prn  -added IS/ and FV today  6/15 breathing better today. I asked her if she took prednisone  with hydrocortisone , and she told me she did at one point, but no longer.    -obsv for now  breathing doing ok- at basline, continue to monitor  Discussed Incentive spirometer SpO2: 97 % O2 Flow Rate (L/min): 4 L/min  9.  Left femoral head avascular necrosis.Probably has bilaterally, based on chart review  Not a surgical candidate.  Conservative care  6/13- went over with therapy- that her goals from my point of view is to walk 15-25 ft, but probably not more- will not make AVN worse, but will hurt more-  10.  E. coli UTI as well as chronic urinary incontinence.  Status post 3-day course of IV Rocephin  completed.  Continue Flomax  0.4 mg daily.  Check PVR  6/14-15 pt is voiding continently. No PVR's but I'm not sure they'reneeded right now  6/16 check PVRs, pt reports she doesn't empty completely - do not see any completed yet 6/19 recheck U/A and culture for dysuria, ask nursing completed PVRs  -Start keflex - monitor cultures- pseudomonas- sensitivities pending- Change to Levofloxacin  6/21: cx + pseudomonas; given n.p.o. will switch to IV cefepime   11.  Normocytic anemia.  Borderline MCV of 96 and RDW of 16.  B12 and folate within normal limits.  Check CBC  6/14- CBC HGB stable at 8.3  6/17  HGB stable 8.5 12.  Addison's disease.  Continue hydrocortisone  chronically.  Followed by endocrinology services Dr.Gebreselassie Nida 13.  Chronic hypotension.  ProAmatine  5 mg twice daily    02/15/2024    9:28 AM 02/15/2024  5:28 AM 02/14/2024    8:01 PM  Vitals with  BMI  Systolic  127 108  Diastolic  49 45  Pulse 89 87 85    14.  Class III morbid obesity.  BMI 39.75.  Dietary follow-up 15.  Hyperlipidemia.  Crestor  16.  Hypothyroidism.  Synthroid  17.  Chronic constipation/ileus.  Linzess  72 mcg daily, Senokot S2 tablets bedtime, Chronulac  as needed  6/13- LBM 2 days ago- she says doesn't go every day  6/15 I suspect her nausea is related to this. We also started fosamax  yesterday. Discussed how oxycodone  can cause nausea and constipation   -  KUB today reveals moderate stool burden, no ileus   -will order fleets enema   -rx nausea, increase ppi to bid   -increase senna to bid  6/16 reports good results with enema yesterday, continue to monitor   6/17 LBM 6/15 consider additional medication if no BM by then  6/18 Patient reports BM yesterday, continue to monitor   6/20 Sorbitol  ordered this AM- no results will order xray abdomen and Enema- discussed with nursing- had BM with this, add miralax   Small, type I bowel movement 6/20  6/21: KUB with likely ileus--diffuse distention and abdominal pain with slow bowel sounds-- will consult surgery per addendum yesterday  18.  GERD.  Protonix  19. Noncompliance- Per nursing, pt refusing to use TLSO for [facility] transfers, of note- will d/w pt that need to follow recs, or there's more risk of additional fractures. 20. Hypokalemia  6/13- will replete 40 mEq and then recheck Monday  6/18 stable K+ at 4.4  21. Azotemia  -6/18 Cr 1.04 encourage oral fluids-order placed  Recheck tomorrow --improved  22. Dysphagia  -SLP consult pending  6/21: Start maintenance fluids 40 mL/h for 1 day normal saline due to n.p.o. status  LOS: 9 days A FACE TO FACE EVALUATION WAS PERFORMED  Joesph JAYSON Likes 02/15/2024, 10:09 AM

## 2024-02-15 NOTE — Plan of Care (Signed)
  Problem: Consults Goal: RH SPINAL CORD INJURY PATIENT EDUCATION Description:  See Patient Education module for education specifics.  Outcome: Progressing   Problem: SCI BOWEL ELIMINATION Goal: RH STG MANAGE BOWEL WITH ASSISTANCE Description: STG Manage Bowel with toileting Assistance. Outcome: Progressing Goal: RH STG SCI MANAGE BOWEL WITH MEDICATION WITH ASSISTANCE Description: STG SCI Manage bowel with medication with mod I assistance. Outcome: Progressing Goal: RH STG SCI MANAGE BOWEL PROGRAM W/ASSIST OR AS APPROPRIATE Description: STG SCI Manage bowel program w/assist or as appropriate. Outcome: Progressing   Problem: SCI BLADDER ELIMINATION Goal: RH STG MANAGE BLADDER WITH ASSISTANCE Description: STG Manage Bladder With Assistance Outcome: Progressing Goal: RH STG MANAGE BLADDER WITH MEDICATION WITH ASSISTANCE Description: STG Manage Bladder With Medication With mod I Assistance. Outcome: Progressing Goal: RH STG SCI MANAGE BLADDER PROGRAM W/ASSISTANCE Description: Toileting protocol Outcome: Progressing   Problem: RH SAFETY Goal: RH STG ADHERE TO SAFETY PRECAUTIONS W/ASSISTANCE/DEVICE Description: STG Adhere to Safety Precautions With cues Assistance/Device. Outcome: Progressing   Problem: RH KNOWLEDGE DEFICIT SCI Goal: RH STG INCREASE KNOWLEDGE OF SELF CARE AFTER SCI Description: Patient and spouse will be able to manage care at discharge using educational resources for medication, skin care, transfers, toileting, dietary modification independently Outcome: Progressing

## 2024-02-15 NOTE — Consult Note (Signed)
 Kathleen [name] 10-29-1960  [id number].    Requesting MD: Lovorn Chief Complaint/Reason for Consult: Ileus  HPI:  63 y/o F w/ a hx of chronic back pain, AI, GERD, hypothyroidism, sarcoid on chronic steroids, and ILD on home O2 who is currently admitted for physical rehabilitation following a recent admission for new compression fractures of the spine. She developed abdominal pain and XR was performed that showed large stool burden as well as possible ileus. Surgery has been consulted to evaluate.  On exam, patient is resting comfortably in bed. She reports that she has abdominal pain at baseline that often fluctuates. She experienced some pain yesterday but today she is close back to baseline. She is passing lots of flatus. Last BM was yesterday. She denies current nausea.   ROS: Review of Systems  Constitutional: Negative.   HENT: Negative.    Eyes: Negative.   Respiratory: Negative.    Cardiovascular: Negative.   Gastrointestinal:  Positive for abdominal pain.  Genitourinary: Negative.   Musculoskeletal:  Positive for back pain.  Skin: Negative.   Neurological:  Positive for weakness.  Endo/Heme/Allergies: Negative.   Psychiatric/Behavioral: Negative.      Family History  Problem Relation Age of Onset   Cardiomyopathy Mother    Breast cancer Mother    Prostate cancer Father    Heart attack Maternal Grandmother    Heart attack Maternal Grandfather    Heart attack Paternal Grandmother    Bipolar disorder Daughter    Heart disease Maternal Aunt    Neurofibromatosis Cousin    Colon cancer Neg Hx     Past Medical History:  Diagnosis Date   Adrenal insufficiency (HCC)    Anemia    Anxiety    Avascular bone necrosis (HCC)    Breast fibrocystic disorder    Chronic pain    Encounter for long-term opiate analgesic use 02/02/2020   Patient has chronic pain with femoral head avascular necrosis.  Is under pain contract   Fibromyalgia    Gastroesophageal reflux disease     Hypothyroidism    Mitral valve prolapse    Orthostatic hypotension    Peripheral neuropathy    PONV (postoperative nausea and vomiting)    Restrictive lung disease    Moderate to severe   Sarcoidosis    Biopsy proven - [facility]   Sarcoidosis    SOB (shortness of breath)    chronic    Past Surgical History:  Procedure Laterality Date   BIOPSY  10/17/2018   Procedure: BIOPSY;  Surgeon: Golda Claudis PENNER, MD;  [facility]: [facility];  Service: Endoscopy;;  duodenum, antrum, gastric body   BIOPSY  03/03/2021   Procedure: BIOPSY;  Surgeon: Golda Claudis PENNER, MD;  [facility]: [facility];  Service: Endoscopy;;   BIOPSY  09/11/2023   Procedure: BIOPSY;  Surgeon: Shaaron Lamar HERO, MD;  [facility]: [facility];  Service: Endoscopy;;   BREAST LUMPECTOMY Bilateral 01/12/2011   CHOLECYSTECTOMY N/A 04/17/2021   Procedure: LAPAROSCOPIC CHOLECYSTECTOMY;  Surgeon: Mavis Anes, MD;  Location: [facility];  Service: General;  Laterality: N/A;   COLONOSCOPY WITH PROPOFOL  N/A 01/03/2022   Procedure: COLONOSCOPY WITH PROPOFOL ;  Surgeon: Golda Claudis PENNER, MD;  [facility]: [facility];  Service: Endoscopy;  Laterality: N/A;  220   ESOPHAGEAL DILATION N/A 03/03/2021   Procedure: ESOPHAGEAL DILATION;  Surgeon: Golda Claudis PENNER, MD;  [facility];  Service: Endoscopy;  Laterality: N/A;   ESOPHAGOGASTRODUODENOSCOPY (EGD) WITH PROPOFOL  N/A 10/17/2018   Procedure:  ESOPHAGOGASTRODUODENOSCOPY (EGD) WITH PROPOFOL ;  Surgeon: Golda Claudis PENNER, MD;  [facility];  Service: Endoscopy;  Laterality: N/A;  2:25   ESOPHAGOGASTRODUODENOSCOPY (EGD) WITH PROPOFOL  N/A 03/03/2021   Procedure: ESOPHAGOGASTRODUODENOSCOPY (EGD) WITH PROPOFOL ;  Surgeon: Golda Claudis PENNER, MD;  [facility];  Service: Endoscopy;  Laterality: N/A;  12:15   ESOPHAGOGASTRODUODENOSCOPY (EGD) WITH PROPOFOL  N/A 09/11/2023   Procedure: ESOPHAGOGASTRODUODENOSCOPY (EGD) WITH PROPOFOL ;  Surgeon: Shaaron Lamar HERO, MD;  [facility];  Service: Endoscopy;  Laterality: N/A;  2:30 pm, asa 3, LM to see if pt can come earlier   [facility] DILATION N/A 09/11/2023   Procedure: AGAPITO DILATION;  Surgeon: Shaaron Lamar HERO, MD;  [facility]: [facility];  Service: Endoscopy;  Laterality: N/A;   ORIF ANKLE FRACTURE Left 03/10/2023   Procedure: OPEN REDUCTION INTERNAL FIXATION (ORIF) ANKLE FRACTURE;  Surgeon: Georgina Ozell LABOR, MD;  Location: [facility];  Service: Orthopedics;  Laterality: Left;   POLYPECTOMY  01/03/2022   Procedure: POLYPECTOMY INTESTINAL;  Surgeon: Golda Claudis PENNER, MD;  [facility];  Service: Endoscopy;;   TUBAL LIGATION      Social History:  reports that she has never smoked. She has never been exposed to tobacco smoke. She has never used smokeless tobacco. She reports that she does not drink alcohol and does not use drugs.  Allergies:  Allergies  Allergen Reactions   Bee Venom Anaphylaxis   Contrast Media [Iodinated Contrast Media] Other (See Comments)    CT chest w/ contrast at OSH followed by SOB resolved w/ single dose steroids, never ENT swelling, intubation or pressors, has had multiple contrasted scans since   Morphine  Other (See Comments)    Substance with morphinan structure and opioid receptor agonist mechanism of action (substance)   Bactrim  [Sulfamethoxazole -Trimethoprim ] Nausea And Vomiting   Codeine Itching    Medications Prior to Admission  Medication Sig Dispense Refill   acetaminophen  (TYLENOL ) 500 MG tablet Take 2 tablets (1,000 mg total) by mouth 3 (three) times daily. 30 tablet 0   albuterol  (PROVENTIL ) (2.5 MG/3ML) 0.083% nebulizer solution Inhale 3 mL (2.5 mg total) by nebulization every four (4) hours as needed for wheezing or shortness of breath. 75 mL 3   alendronate  (FOSAMAX ) 70 MG tablet Take 1 tablet (70 mg total) by mouth every 7 (seven) days. Take with a full glass of water  on an empty stomach.     budesonide  (PULMICORT ) 0.5 MG/2ML nebulizer solution Inhale the contents  of 1 vial via nebulizer two (2) times a day. 360 mL 3   busPIRone  (BUSPAR ) 15 MG tablet Take 1 tablet (15 mg) by mouth 3 times daily. 90 tablet 4   cyanocobalamin  (VITAMIN B12) 1000 MCG/ML injection Inject 1 mL (1,000 mcg total) into the skin daily.     cyclobenzaprine  (FLEXERIL ) 5 MG tablet Take 1 tablet (5 mg total) by mouth 3 (three) times daily. 90 tablet 2   DULoxetine  (CYMBALTA ) 60 MG capsule Take 1 capsule (60 mg total) by mouth daily. 30 capsule 5   EPINEPHrine  0.3 mg/0.3 mL IJ SOAJ injection Inject 0.3 mg into the muscle as needed for anaphylaxis. 1 each 2   ferrous sulfate  325 (65 FE) MG EC tablet Take 325 mg by mouth once a week.     folic acid  (FOLVITE ) 1 MG tablet Take 1 tablet (1 mg total) by mouth daily.     furosemide  (LASIX ) 20 MG tablet Take 1 tablet (20 mg total) by mouth every Monday, Wednesday, and  Friday. 40 tablet 3   hydrocortisone  (CORTEF ) 10 MG tablet Take 2 tablets by mouth at 8:00 am and 1 tablet at lunch. (May double dose on sick days x 3-5 days) 120 tablet 3   HYDROmorphone  (DILAUDID ) 1 MG/ML injection Inject 1 mL (1 mg total) into the vein every 3 (three) hours as needed (Severe pain not relieved by oral therapy).     hydrOXYzine  (ATARAX ) 25 MG tablet Take 1 tablet (25 mg total) by mouth 3 (three) times daily as needed for anxiety. 90 tablet 1   ipratropium-albuterol  (DUONEB) 0.5-2.5 (3) MG/3ML SOLN Take 3 mLs by nebulization every 8 (eight) hours. 270 mL 11   lactulose  (CHRONULAC ) 10 GM/15ML solution Take 15 mLs (10 g total) by mouth daily as needed for mild constipation. 237 mL 2   levothyroxine  (SYNTHROID ) 50 MCG tablet Take 1 tablet by mouth daily before breakfast. 90 tablet 1   lidocaine  (LIDODERM ) 5 % Place 1 patch onto the skin daily. Remove & Discard patch within 12 hours or as directed by MD 30 patch 0   LINZESS  72 MCG capsule Take 1 capsule (72 mcg total) by mouth daily before breakfast. 30 capsule 3   midodrine  (PROAMATINE ) 5 MG tablet Take 1 tablet (5 mg  total) by mouth 2 (two) times daily with a meal. 60 tablet 5   ondansetron  (ZOFRAN -ODT) 8 MG disintegrating tablet Dissolve 1 tablet (8 mg total) by mouth every 8 (eight) hours as needed. (Patient taking differently: Take 8 mg by mouth every 8 (eight) hours as needed for nausea or vomiting.) 15 tablet 5   oxyCODONE  (ROXICODONE ) 30 MG immediate release tablet Take 1 tablet (30 mg total) by mouth every 4 (four) hours as needed for moderate pain (pain score 4-6) or severe pain (pain score 7-10).     OXYGEN  Inhale 5 L into the lungs continuous.     pantoprazole  (PROTONIX ) 40 MG tablet Take 1 tablet (40 mg total) by mouth daily. 30 tablet 5   polyethylene glycol (MIRALAX  / GLYCOLAX ) 17 g packet Take 17 g by mouth 2 (two) times daily. 60 each 4   rosuvastatin  (CRESTOR ) 20 MG tablet Take 1 tablet (20 mg total) by mouth daily. 90 tablet 1   senna-docusate (SENOKOT-S) 8.6-50 MG tablet Take 2 tablets by mouth at bedtime. 60 tablet 3   tamsulosin  (FLOMAX ) 0.4 MG CAPS capsule Take 1 capsule (0.4 mg total) by mouth daily after supper. 30 capsule 11   Vitamin D , Ergocalciferol , (DRISDOL ) 1.25 MG (50000 UNIT) CAPS capsule Take 1 capsule (50,000 Units total) by mouth every Sunday. 5 capsule 5    Physical Exam: Blood pressure (!) 127/49, pulse 89, temperature 97.9 F (36.6 C), temperature source Oral, resp. rate (!) 22, height 5' 10 (1.778 m), weight 124.9 kg, last menstrual period 05/17/2016, SpO2 97%. Gen: female, NAD Abd: soft, non-distended, non-tender to palpation  Results for orders placed or performed during the hospital encounter of 02/06/24 (from the past 48 hours)  CBC     Status: Abnormal   Collection Time: 02/15/24  7:20 AM  Result Value Ref Range   WBC 7.9 4.0 - 10.5 K/uL   RBC 3.02 (L) 3.87 - 5.11 MIL/uL   Hemoglobin 8.5 (L) 12.0 - 15.0 g/dL   HCT 70.5 (L) 63.9 - 53.9 %   MCV 97.4 80.0 - 100.0 fL   MCH 28.1 26.0 - 34.0 pg   MCHC 28.9 (L) 30.0 - 36.0 g/dL   RDW 83.7 (H) 88.4 - 84.4 %  Platelets 448 (H) 150 - 400 K/uL   nRBC 0.0 0.0 - 0.2 %    Comment: Performed at [facility], [address] [place], [place] [address]  Basic metabolic panel     Status: Abnormal   Collection Time: 02/15/24  7:20 AM  Result Value Ref Range   Sodium 140 135 - 145 mmol/L   Potassium 3.5 3.5 - 5.1 mmol/L   Chloride 97 (L) 98 - 111 mmol/L   CO2 35 (H) 22 - 32 mmol/L   Glucose, Bld 92 70 - 99 mg/dL    Comment: Glucose reference range applies only to samples taken after fasting for at least 8 hours.   BUN <5 (L) 8 - 23 mg/dL   Creatinine, Ser 9.16 0.44 - 1.00 mg/dL   Calcium  8.5 (L) 8.9 - 10.3 mg/dL   GFR, Estimated >39 >39 mL/min    Comment: (NOTE) Calculated using the CKD-EPI Creatinine Equation (2021)    Anion gap 8 5 - 15    Comment: Performed at [facility], [address] [place], [place] [address]   *Note: Due to a large number of results and/or encounters for the requested time period, some results have not been displayed. A complete set of results can be found in Results Review.   DG Abd 2 Views Result Date: 02/15/2024 CLINICAL DATA:  Nausea EXAM: ABDOMEN - 2 VIEW COMPARISON:  None Available. FINDINGS: Mildly dilated gas-filled bowel loops within the left hemiabdomen. No free air or pneumatosis. Large volume stool projects over the ascending colon. Small to moderate volume stool within the remainder of the colon. No abnormal radio-opaque calculi or mass effect. No acute or substantial osseous abnormality. The sacrum and coccyx are partially obscured by overlying bowel contents. IMPRESSION: 1. Mildly dilated gas-filled bowel loops within the left hemiabdomen, likely ileus. 2. Large volume stool projects over the ascending colon. Small to moderate volume stool within the remainder of the colon. Electronically Signed   By: Limin  Xu M.D.   On: 02/15/2024 11:01   DG Abd 1 View Result Date: 02/14/2024 CLINICAL DATA:  Constipation EXAM: ABDOMEN - 1 VIEW COMPARISON:   02/09/2024 FINDINGS: Limited by motion and body habitus as well as rectangular artifact obscuring the left mid abdomen. Suspicion of some dilated central small bowel measuring to 4.7 cm. Some gas in the colon is observed IMPRESSION: Limited by motion and body habitus. Suspicion of some dilated central small bowel, ileus versus partial small bowel obstruction. Electronically Signed   By: Luke Bun M.D.   On: 02/14/2024 19:52    Assessment/Plan 63 y/o F w/ chronic back and abdominal pain who is admitted for rehab following a back fracture and experienced some abdominal pain and distention on 6/20  - Low suspicion for an obstructive process. She continues to pass large volume of flatus, had a BM yesterday, and her abdomen is soft. She reports her pain is near baseline and denies active nausea.  - Would recommend continuing with PO meds and enemas to relieve her constipation. Her orders show that she has been on Linzess  as well as Lactulose . Can consider GI consult to assist with management - Okay for a diet as tolerated - Surgery will sign off. Please reach out with any questions/concerns  Cordella DELENA Polly Marlis [facility] 02/15/2024, 1:20 PM Please see Amion for pager number during day hours 7:00am-4:30pm or 7:00am -11:30am on weekends

## 2024-02-15 NOTE — Progress Notes (Signed)

## 2024-02-15 NOTE — Evaluation (Signed)
 Speech Language Pathology Assessment and Plan  Patient Details  Name: Kathleen [name] MRN: [id number] Date of Birth: 1960/12/29  SLP Diagnosis: Dysphagia  Rehab Potential: Good ELOS: ELOS 7/1    Today's Date: 02/15/2024 SLP Individual Time: 0800-0820 SLP Individual Time Calculation (min): 20 min   Hospital Problem: Principal Problem:   Spinal compression fracture (HCC) Active Problems:   Anxiety and depression   Intractable back pain   Anxiety  Past Medical History:  Past Medical History:  Diagnosis Date   Adrenal insufficiency (HCC)    Anemia    Anxiety    Avascular bone necrosis (HCC)    Breast fibrocystic disorder    Chronic pain    Encounter for long-term opiate analgesic use 02/02/2020   Patient has chronic pain with femoral head avascular necrosis.  Is under pain contract   Fibromyalgia    Gastroesophageal reflux disease    Hypothyroidism    Mitral valve prolapse    Orthostatic hypotension    Peripheral neuropathy    PONV (postoperative nausea and vomiting)    Restrictive lung disease    Moderate to severe   Sarcoidosis    Biopsy proven - [facility]   Sarcoidosis    SOB (shortness of breath)    chronic   Past Surgical History:  Past Surgical History:  Procedure Laterality Date   BIOPSY  10/17/2018   Procedure: BIOPSY;  Surgeon: Golda Claudis PENNER, MD;  [facility];  Service: Endoscopy;;  duodenum, antrum, gastric body   BIOPSY  03/03/2021   Procedure: BIOPSY;  Surgeon: Golda Claudis PENNER, MD;  [facility];  Service: Endoscopy;;   BIOPSY  09/11/2023   Procedure: BIOPSY;  Surgeon: Shaaron Lamar HERO, MD;  [facility];  Service: Endoscopy;;   BREAST LUMPECTOMY Bilateral 01/12/2011   CHOLECYSTECTOMY N/A 04/17/2021   Procedure: LAPAROSCOPIC CHOLECYSTECTOMY;  Surgeon: Mavis Anes, MD;  Location: [facility];  Service: General;  Laterality: N/A;   COLONOSCOPY WITH PROPOFOL  N/A 01/03/2022   Procedure: COLONOSCOPY WITH PROPOFOL ;  Surgeon:  Golda Claudis PENNER, MD;  [facility];  Service: Endoscopy;  Laterality: N/A;  220   ESOPHAGEAL DILATION N/A 03/03/2021   Procedure: ESOPHAGEAL DILATION;  Surgeon: Golda Claudis PENNER, MD;  [facility];  Service: Endoscopy;  Laterality: N/A;   ESOPHAGOGASTRODUODENOSCOPY (EGD) WITH PROPOFOL  N/A 10/17/2018   Procedure: ESOPHAGOGASTRODUODENOSCOPY (EGD) WITH PROPOFOL ;  Surgeon: Golda Claudis PENNER, MD;  [facility];  Service: Endoscopy;  Laterality: N/A;  2:25   ESOPHAGOGASTRODUODENOSCOPY (EGD) WITH PROPOFOL  N/A 03/03/2021   Procedure: ESOPHAGOGASTRODUODENOSCOPY (EGD) WITH PROPOFOL ;  Surgeon: Golda Claudis PENNER, MD;  [facility];  Service: Endoscopy;  Laterality: N/A;  12:15   ESOPHAGOGASTRODUODENOSCOPY (EGD) WITH PROPOFOL  N/A 09/11/2023   Procedure: ESOPHAGOGASTRODUODENOSCOPY (EGD) WITH PROPOFOL ;  Surgeon: Shaaron Lamar HERO, MD;  [facility];  Service: Endoscopy;  Laterality: N/A;  2:30 pm, asa 3, LM to see if pt can come earlier   [facility] DILATION N/A 09/11/2023   Procedure: AGAPITO DILATION;  Surgeon: Shaaron Lamar HERO, MD;  [facility]: [facility];  Service: Endoscopy;  Laterality: N/A;   ORIF ANKLE FRACTURE Left 03/10/2023   Procedure: OPEN REDUCTION INTERNAL FIXATION (ORIF) ANKLE FRACTURE;  Surgeon: Georgina Ozell LABOR, MD;  Location: [facility];  Service: Orthopedics;  Laterality: Left;   POLYPECTOMY  01/03/2022   Procedure: POLYPECTOMY INTESTINAL;  Surgeon: Golda Claudis PENNER, MD;  [facility];  Service: Endoscopy;;   TUBAL LIGATION  Assessment / Plan / Recommendation Clinical Impression Pt is a 63 year old right handed female with history of chronic hypoxic respiratory failure (5 L O2) interstitial lung disease followed by pulmonary services Dr. Selinda Moots at [facility], sarcoidosis maintained on chronic prednisone , Addison's disease maintained on Cortef  followed by endocrinology services Dr.Gebreselassie Nida, chronic hypotension, chronic anemia,  chronic urinary incontinence, class III obesity BMI 39.75, bilateral hip AVN and not felt to be a surgical candidate, osteoporosis with multiple vertebral fractures on chronic narcotics followed at the [facility] pain clinic and recent admissions for intractable back pain due to compression fractures of L4 (4/12 - 4/16) and then L3 (5/9 - 5/14), readmitted 01/20/2024 with acute L1 fracture discharged to home 01/23/2024 with [facility] home health services. Per chart review patient lives with spouse. 1 level home one-step to entry. Patient readmitted 01/25/2024 with increasing low back pain to [facility]. CT lumbar spine showed superior endplate fracture of L1 appearing stable with approximately 10 to 15% loss of height no retropulsion. Stable superior endplate fractures of L3 and L4 and also 25 to 30% loss of height and minimal retropulsion of the posterior superior aspect of the vertebral bodies without significant associated canal stenosis. CT of the pelvis showed acute left pubic symphyseal and right sacral ala compression fractures/L4 superior endplate fracture as well as avascular necrosis of the left femoral head with mild articular collapse. Admission chemistries unremarkable except chloride 94 CO2 30 glucose 145 alkaline phosphatase 129, hemoglobin 9.1, urine culture greater than 100,000 E. coli. Patient was transferred to [facility] due to increasing back pain. Interventional radiology consulted/Dr. Dolphus and discussed at length kyphoplasty which patient declined.. TLSO back brace as directed. Hospital course patient did receive a 3-day course of intravenous ceftriaxone  for E. coli UTI 6/1 through 6/3. She was cleared to begin Lovenox  for DVT prophylaxis. Followed the distance by critical care medicine for pulmonary sarcoidosis/chronic hypoxic respiratory failure and currently on 4 L of oxygen  nasal cannula. Therapy evaluations completed due to patient's decreased functional mobility and  intractable back pain was admitted for a comprehensive rehab program.   Evaluation significantly limited by current NPO status for imaging. She was able to take sips of water  and eat some ice and no overt s/s of airway invasion were noted despite partially reclined position. Given limitations d/t NPO status, subjective report completed via conversation. She reported globus sensation with food and liquids that has been going on for awhile. She also endorsed hx of reflux as well and increased symptoms at night. Pt is currently on reflux meds. Of note, anxiety was evident throughout eval re nauseous feeling and O2 sats (WNL). Anticipate esophageal dysphagia, however, pt would benefit from skilled ST services to complete diagnostic tx tasks and ensure continued diet tolerance w/ regular textures. Recommend cont diet of regular textures/thin liquids at this time. Meds whole w/ liquid.     Skilled Therapeutic Interventions          Portions of a bedside swallow eval were completed in attempts to evaluate her oropharyngeal swallow function. See clinical impressions for more info.    SLP Assessment  Patient will need skilled Speech Lanaguage Pathology Services during CIR admission    Recommendations  Recommended Consults: Consider GI evaluation;Consider esophageal assessment SLP Diet Recommendations: Age appropriate regular solids;Thin Liquid Administration via: Straw Medication Administration: Whole meds with liquid Supervision: Patient able to self feed Postural Changes and/or Swallow Maneuvers: Seated upright 90 degrees;Upright 30-60 min after meal Oral Care Recommendations: Oral care BID  Recommendations for Other Services: Neuropsych consult Patient destination: Home Follow up Recommendations: Other (comment) (potential GI consult) Equipment Recommended: None recommended by SLP    SLP Frequency 1 to 3 out of 7 days   SLP Duration  SLP Intensity  SLP Treatment/Interventions ELOS  7/1  Minumum of 1-2 x/day, 30 to 90 minutes  Dysphagia/aspiration precaution training;Cueing hierarchy;Patient/family education;Therapeutic Exercise;Oral motor exercises;Therapeutic Activities    Pain    Prior Functioning    SLP Evaluation Cognition    Comprehension   Expression   Oral Motor    Care Tool Care Tool Cognition Ability to hear (with hearing aid or hearing appliances if normally used Ability to hear (with hearing aid or hearing appliances if normally used): 0. Adequate - no difficulty in normal conservation, social interaction, listening to TV   Expression of Ideas and Wants Expression of Ideas and Wants: 4. Without difficulty (complex and basic) - expresses complex messages without difficulty and with speech that is clear and easy to understand   Understanding Verbal and Non-Verbal Content Understanding Verbal and Non-Verbal Content: 4. Understands (complex and basic) - clear comprehension without cues or repetitions  Memory/Recall Ability Memory/Recall Ability : Current season;Location of own room;That he or she is in a hospital/hospital unit;Staff names and faces   PMSV Assessment  PMSV Trial    Bedside Swallowing Assessment General Previous Swallow Assessment: n/a Diet Prior to this Study: Regular;Thin liquids (Level 0) Temperature Spikes Noted: No Respiratory Status: Supplemental O2 delivered via (comment) (nasal cannula) History of Recent Intubation: No Behavior/Cognition: Alert;Requires cueing;Other (Comment) (anxious) Oral Cavity - Dentition: Missing dentition Self-Feeding Abilities: Able to feed self Patient Positioning: Partially reclined Baseline Vocal Quality: Other (comment) (harsh vocal quality)  Oral Care Assessment Oral Assessment  (WDL): Within Defined Limits Lips: Symmetrical Teeth: Missing (Comment) Tongue: Pink Mucous Membrane(s): Pink Saliva: Moist, saliva free flowing Level of Consciousness: Alert Is patient on any of following  O2 devices?: None of the above Nutritional status: No high risk factors Oral Assessment Risk : Low Risk Ice Chips Ice chips: Within functional limits Presentation: Cup;Self Fed Thin Liquid Thin Liquid: Within functional limits Presentation: Cup;Straw;Self Fed Nectar Thick   Honey Thick   Puree   Solid   BSE Assessment Suspected Esophageal Findings Suspected Esophageal Findings: Belching Risk for Aspiration Impact on safety and function: No limitations  Short Term Goals: Week 1: SLP Short Term Goal 1 (Week 1): Pt will participate in functional swallow evaluation of regular textures SLP Short Term Goal 2 (Week 1): Pt will participate in instrumental swallow evaluation as needed to identify further recommendations as needed SLP Short Term Goal 3 (Week 1): Pt will verbalize understanding of dysphagia education given supervisionA  Refer to Care Plan for Long Term Goals  Recommendations for other services: Neuropsych  Discharge Criteria: Patient will be discharged from SLP if patient refuses treatment 3 consecutive times without medical reason, if treatment goals not met, if there is a change in medical status, if patient makes no progress towards goals or if patient is discharged from hospital.  The above assessment, treatment plan, treatment alternatives and goals were discussed and mutually agreed upon: by patient  Kathleen [name] 02/15/2024, 8:31 AM

## 2024-02-15 NOTE — Plan of Care (Signed)
  Problem: RH Swallowing Goal: LTG Patient will consume least restrictive diet using compensatory strategies with assistance (SLP) Description: LTG:  Patient will consume least restrictive diet using compensatory strategies with assistance (SLP) Flowsheets (Taken 02/15/2024 1245) LTG: Pt Patient will consume least restrictive diet using compensatory strategies with assistance of (SLP): Modified Independent Goal: LTG Patient will participate in dysphagia therapy to increase swallow function with assistance (SLP) Description: LTG:  Patient will participate in dysphagia therapy to increase swallow function with assistance (SLP) Flowsheets (Taken 02/15/2024 1245) LTG: Pt will participate in dysphagia therapy to increase swallow function with assistance of (SLP): Modified Independent

## 2024-02-15 NOTE — Progress Notes (Signed)
 Physical Therapy Session Note  Patient Details  Name: Kathleen [name] MRN: [id number] Date of Birth: 1961/01/17  Today's Date: 02/15/2024 PT Individual Time: 1447-1530 PT Individual Time Calculation (min): 43 min   Short Term Goals: Week 2:  PT Short Term Goal 1 (Week 2): Pt will maintain static standing balance up to x3' with LRAD  Skilled Therapeutic Interventions/Progress Updates: Pt presents semi-reclined in bed and agreeable to therapy in room.  Pt states pain of 10/10 and pre-medicated.  Rest breaks given and education on log rolling technique and decreasing speed of movement to manage pain.  Pt very impulsive and quick-moving.  Pt transfers to EOB w/ partial roll and use of gait belt at footboard w/ CGA mostly for air mattress.  Pt transfers sit to stand from elevated bed height and mod A to RW.  Pt transferred bed > BSC w/ min A and cues for safety 2/2 impulsiveness.  Pt continent of ~150 ml, charted in Flowsheets.  Pt performed sit to stand w/ min A and then quickly pivoted to bed , requiring education on safety .  Pt transferred sit to supine, again before PT cued or assisted and c/o increased pain to LB.  Re-education of log roll technique to spare LB.  Pt performed AP, HS, SLR 3 x 10-15 w/ rest breaks between 2/2 pain and fatigue.  Pt O2 sats remained at least 93% on 5 LPM.  Education given for breathing techniques.  Pt remained semi-reclined in bed w/ all needs in reach.     Therapy Documentation Precautions:  Precautions Precautions: Fall Recall of Precautions/Restrictions: Intact Precaution/Restrictions Comments: TLSO brace (when ambulating), pt refused to don at evaluation Required Braces or Orthoses: Spinal Brace Spinal Brace: Thoracolumbosacral orthotic Restrictions Weight Bearing Restrictions Per Provider Order: No General:   Vital Signs:   Pain:10/10. Pain Assessment Pain Scale: 0-10 Pain Score: 10-Worst pain ever Pain Type: Acute pain Pain Location: Back Pain  Orientation: Lower Pain Descriptors / Indicators: Aching;Sharp;Discomfort Pain Frequency: Constant Pain Onset: On-going Patients Stated Pain Goal: 3 Pain Intervention(s): Medication (See eMAR) Multiple Pain Sites: No   Therapy/Group: Individual Therapy  Sakara Lehtinen P Marlee Trentman 02/15/2024, 3:36 PM

## 2024-02-16 MED ORDER — METOCLOPRAMIDE HCL 5 MG PO TABS
5.0000 mg | ORAL_TABLET | Freq: Three times a day (TID) | ORAL | Status: DC
  Administered 2024-02-16 – 2024-02-23 (×22): 5 mg via ORAL
  Filled 2024-02-16 (×23): qty 1

## 2024-02-16 MED ORDER — SODIUM CHLORIDE 0.9 % IV SOLN: INTRAVENOUS | Status: DC | PRN

## 2024-02-16 NOTE — Progress Notes (Signed)
 Occupational Therapy Session Note  Patient Details  Name: Kathleen [name] MRN: [id number] Date of Birth: [birth year]  Today's Date: 02/16/2024 OT Individual Time: 1500-1550 OT Individual Time Calculation (min): 50 min    Short Term Goals: Week 1:  OT Short Term Goal 1 (Week 1): Pt will perform stand pivot transfer to the toilet/ BSC with RW with min A consistently OT Short Term Goal 2 (Week 1): Pt will perform toileting with max A 2/3 steps OT Short Term Goal 3 (Week 1): Pt will perform bed mobility with min A from flat bed with bed rails OT Short Term Goal 4 (Week 1): Pt will be able to don LB clothing (underwear/ pants ) with mi nA  Skilled Therapeutic Interventions/Progress Updates:      Therapy Documentation Precautions:  Precautions Precautions: Fall Recall of Precautions/Restrictions: Intact Precaution/Restrictions Comments: TLSO brace (when ambulating), pt refused to don at evaluation Required Braces or Orthoses: Spinal Brace Spinal Brace: Thoracolumbosacral orthotic Restrictions Weight Bearing Restrictions Per Provider Order: No General: Pt supine in bed upon OT arrival, agreeable to OT session. Husband present for session.  Vital Signs: vitals stable throughout session on continuous monitor  Pain:  10/10 pain reported in back, activity, intermittent rest breaks, distractions provided for pain management, pt reports tolerable to proceed.   ADL: OT providing skilled intervention on ADL retraining in order to increase independence with tasks and increase activity tolerance. Pt completed the following tasks at the current level of assist: Bed mobility: SBA with HOB raised, Max A +2 to scoot up on bed toward [facility] Toilet transfer: Min A with VC for safety with RW, OT assisting with managing lines Toileting: Mod A, pt asking OT to complete d/t anxiety of letting hands off of RW LB dressing: Max A of changing brief, 2x attempts d/t pt requiring seated rest break during standing   Transfers: pt completing 5x sit to stands with education for safe technique, pt completing from CGA at increased surface height, Min A for decreased height   Other Treatments: OT educating pt on anxiety coping strategies when standing/walking d/t pt having increased anxiety about tasks. Pt receptive to information.   Pt supine in bed with bed alarm activated, 2 bed rails up, call light within reach and 4Ps assessed.   Therapy/Group: Individual Therapy  Camie Hoe, OTD, OTR/L 02/16/2024, 3:56 PM

## 2024-02-16 NOTE — Progress Notes (Signed)
 PROGRESS NOTE   Subjective/Complaints:  Straight cathed for 900 one time yesterday, subsequent voids have been continent with low PVRs. No documentation on intakes yesterday, fluid intakes very poor less than 200 cc.  Patient endorses that she does not like the modified diet and wants it advanced, but is understanding of why. No bowel movement since 6/20.  She has been passing flatus.  Left quadrant diffuse abdominal pain.  Vital stable    02/16/2024    4:06 AM 02/15/2024   11:20 PM 02/15/2024    7:40 PM  Vitals with BMI  Systolic 104  105  Diastolic 51  44  Pulse 87 89 86    ROS: Patient denies fever, rash, vomiting, diarrhea, shortness of breath or chest pain,headache  + back pain + anxiety- a little improved + dysuria + constipation + Abdominal pain   Objective:   DG Abd 2 Views Result Date: 02/15/2024 CLINICAL DATA:  Nausea EXAM: ABDOMEN - 2 VIEW COMPARISON:  None Available. FINDINGS: Mildly dilated gas-filled bowel loops within the left hemiabdomen. No free air or pneumatosis. Large volume stool projects over the ascending colon. Small to moderate volume stool within the remainder of the colon. No abnormal radio-opaque calculi or mass effect. No acute or substantial osseous abnormality. The sacrum and coccyx are partially obscured by overlying bowel contents. IMPRESSION: 1. Mildly dilated gas-filled bowel loops within the left hemiabdomen, likely ileus. 2. Large volume stool projects over the ascending colon. Small to moderate volume stool within the remainder of the colon. Electronically Signed   By: Limin  Xu M.D.   On: 02/15/2024 11:01   DG Abd 1 View Result Date: 02/14/2024 CLINICAL DATA:  Constipation EXAM: ABDOMEN - 1 VIEW COMPARISON:  02/09/2024 FINDINGS: Limited by motion and body habitus as well as rectangular artifact obscuring the left mid abdomen. Suspicion of some dilated central small bowel measuring to 4.7  cm. Some gas in the colon is observed IMPRESSION: Limited by motion and body habitus. Suspicion of some dilated central small bowel, ileus versus partial small bowel obstruction. Electronically Signed   By: Luke Bun M.D.   On: 02/14/2024 19:52    Recent Labs    02/15/24 0720  WBC 7.9  HGB 8.5*  HCT 29.4*  PLT 448*    Recent Labs    02/15/24 0720  NA 140  K 3.5  CL 97*  CO2 35*  GLUCOSE 92  BUN <5*  CREATININE 0.83  CALCIUM  8.5*     Intake/Output Summary (Last 24 hours) at 02/16/2024 1048 Last data filed at 02/16/2024 0400 Gross per 24 hour  Intake 207.17 ml  Output 1450 ml  Net -1242.83 ml        Physical Exam: Vital Signs Blood pressure (!) 104/51, pulse 87, temperature 97.9 F (36.6 C), resp. rate 17, height 5' 10 (1.778 m), weight 124.9 kg, last menstrual period 05/17/2016, SpO2 99%.  Constitutional: No apparent distress. Appropriate appearance for age.  Obese. HENT: No JVD. Neck Supple. Trachea midline. Atraumatic, normocephalic. Eyes: PERRLA. EOMI. Visual fields grossly intact.  Cardiovascular: RRR, no murmurs/rub/gallops.  2+ right lower extremity, 1+ left lower extremity edema. Peripheral pulses 2+  Respiratory: CTAB. No rales,  rhonchi, or wheezing. On 3 L nasal cannula. Abdomen: + bowel sounds, hypoactive.  Distention and tenderness in the left upper and lower quadrant, without guarding or palpable deformity.   Skin: C/D/I. No apparent lesions. MSK:      No apparent deformity.       Neurologic exam:  Awake, alert, oriented x 4.  No apparent deficits.  Cranial nerve exam 2 through 12 intact.  Intact to light touch throughout. Ue's5-/5 in Deltoids, biceps, triceps; WE 4/5- give way weakness; Grip 4+/5 and FA 4+/5 B/L LE's- 5-/5 throughout B/L      Assessment/Plan: 1. Functional deficits which require 3+ hours per day of interdisciplinary therapy in a comprehensive inpatient rehab setting. Physiatrist is providing close team supervision and 24 hour  management of active medical problems listed below. Physiatrist and rehab team continue to assess barriers to discharge/monitor patient progress toward functional and medical goals  Care Tool:  Bathing    Body parts bathed by patient: Left arm, Right arm, Abdomen, Chest, Face   Body parts bathed by helper: Front perineal area, Buttocks, Right upper leg, Left upper leg, Right lower leg, Left lower leg     Bathing assist Assist Level: Maximal Assistance - Patient 24 - 49%     Upper Body Dressing/Undressing Upper body dressing   What is the patient wearing?: Orthosis Orthosis activity level: Performed by helper  Upper body assist Assist Level: Total Assistance - Patient < 25%    Lower Body Dressing/Undressing Lower body dressing      What is the patient wearing?: Incontinence brief, Pants     Lower body assist Assist for lower body dressing: Total Assistance - Patient < 25%     Toileting Toileting    Toileting assist Assist for toileting: Total Assistance - Patient < 25%     Transfers Chair/bed transfer  Transfers assist     Chair/bed transfer assist level: Contact Guard/Touching assist     Locomotion Ambulation   Ambulation assist      Assist level: Contact Guard/Touching assist Assistive device: Walker-rolling Max distance: x88'   Walk 10 feet activity   Assist  Walk 10 feet activity did not occur: Safety/medical concerns  Assist level: Contact Guard/Touching assist Assistive device: Walker-rolling   Walk 50 feet activity   Assist Walk 50 feet with 2 turns activity did not occur: Safety/medical concerns  Assist level: Contact Guard/Touching assist Assistive device: Walker-rolling    Walk 150 feet activity   Assist Walk 150 feet activity did not occur: Safety/medical concerns (pt too fatigued, SOB)         Walk 10 feet on uneven surface  activity   Assist Walk 10 feet on uneven surfaces activity did not occur: Safety/medical  concerns         Wheelchair     Assist Is the patient using a wheelchair?: Yes Type of Wheelchair: Manual Wheelchair activity did not occur: Safety/medical concerns  Wheelchair assist level: Dependent - Patient 0% Max wheelchair distance: 2x220'    Wheelchair 50 feet with 2 turns activity    Assist    Wheelchair 50 feet with 2 turns activity did not occur: Safety/medical concerns   Assist Level: Dependent - Patient 0%   Wheelchair 150 feet activity     Assist  Wheelchair 150 feet activity did not occur: Safety/medical concerns   Assist Level: Dependent - Patient 0%   Blood pressure (!) 104/51, pulse 87, temperature 97.9 F (36.6 C), resp. rate 17, height 5' 10 (1.778 m),  weight 124.9 kg, last menstrual period 05/17/2016, SpO2 99%.  Medical Problem List and Plan: 1. Functional deficits secondary to compression and multiple fractures of spine without SCI-intractable acute on chronic low back pain due to osteoporotic fracture involving L1, L3 and L4/acute left pubic symphysis and right sacral ala fracture.  Patient declined kyphoplasty.  Conservative management with narcotics and TLSO             -patient may  shower             -ELOS/Goals: 12-14 days- supervision to min A             -Continue CIR therapies including PT, OT   - L-spine x-ray worsening compression deformity L1 level  -Exp dc 7/1   2.  Antithrombotics: -DVT/anticoagulation:  Pharmaceutical: Lovenox .  Check vascular study 6/13- Dopplers (-)             -antiplatelet therapy: N/A 3. Pain Management/chronic pain followed at [facility] pain [facility]- usually takes 10 mg 6x/day: Flexeril  5 mg 3 times daily, lidoderm  patch, oxycodone  30 mg every 4 hours as needed upon transfer- attempting, with d/w pt and husband, to add MS Contin  15 mg BID and reduce Oxycodone  to 20 mg q4 hours  6/13- changed to Oxycontin  10 mg BID and continue Oxy 20 mg q4 hours prn  6/16 increase oxycontin  to 15mg  Q12h  6/17 Increase  OxyContin  to 20 mg every 12 hours 4. Mood/Behavior/Sleep:  Pt has severe anxiety Cymbalta  60 mg daily, BuSpar  15 mg 3 times daily, Atarax  25 mg 3 times daily as needed             -added Celexa  10 mg daily for anxiety- is better for anxiety than Duloxetine , but need Duloxetine  for pain control  - 6/17 seen by neuropsychology for anxiety with depression, appreciate assistance             -antipsychotic agents: N/A  5. Neuropsych/cognition: This patient is ?capable of making decisions on her own behalf. 6. Skin/Wound Care: Routine skin checks 7. Fluids/Electrolytes/Nutrition: Routine in and outs with follow-up chemistries  8.  Sarcoidosis/interstitial lung disease/chronic respiratory failure with hypoxia- Was on 4-5L at home- been doing 3-4L here- and concern for hypercarbia.  Continue chronic oxygen  therapy.  Prednisone  as directed, Lasix  20 mg Monday Wednesday Fridays.  Followed by Dr. Selinda Moots pulmonary services [facility]  6/14- some sob this morning but VS ok. ?anxiety component  -she is receiving brovana  and pulmicort  scheduled with albuterol  prn  -added IS/ and FV today  6/15 breathing better today. I asked her if she took prednisone  with hydrocortisone , and she told me she did at one point, but no longer.    -obsv for now  breathing doing ok- at basline, continue to monitor  Discussed Incentive spirometer SpO2: 99 % O2 Flow Rate (L/min): 4 L/min  6/22: Remains on 4 L nasal cannula, satting well.  9.  Left femoral head avascular necrosis.Probably has bilaterally, based on chart review  Not a surgical candidate.  Conservative care  6/13- went over with therapy- that her goals from my point of view is to walk 15-25 ft, but probably not more- will not make AVN worse, but will hurt more-   10.  E. coli UTI as well as chronic urinary incontinence.  Status post 3-day course of IV Rocephin  completed.  Continue Flomax  0.4 mg daily.  Check PVR  6/14-15 pt is voiding continently. No PVR's but I'm  not sure they'reneeded right now  6/16 check PVRs, pt reports she doesn't empty completely - do not see any completed yet 6/19 recheck U/A and culture for dysuria, ask nursing completed PVRs  -Start keflex - monitor cultures- pseudomonas- sensitivities pending- Change to Levofloxacin  6/21: cx + pseudomonas; given n.p.o. will switch to IV cefepime  2 g q8h 6/22: Continence improving, continue IV cefepime  for now.  Discussed risks of Foley placement for incontinence given patient discomfort with transferring to bedside commode, she is agreeable to continuing this intermittent straight cathing as needed; husband has been trained on straight cathing in the past and is comfortable doing this at discharge if needed    11.  Normocytic anemia.  Borderline MCV of 96 and RDW of 16.  B12 and folate within normal limits.  Check CBC  6/14- CBC HGB stable at 8.3  6/17  HGB stable 8.5 12.  Addison's disease.  Continue hydrocortisone  chronically.  Followed by endocrinology services Dr.Gebreselassie Nida 13.  Chronic hypotension.  ProAmatine  5 mg twice daily    02/16/2024    4:06 AM 02/15/2024   11:20 PM 02/15/2024    7:40 PM  Vitals with BMI  Systolic 104  105  Diastolic 51  44  Pulse 87 89 86    14.  Class III morbid obesity.  BMI 39.75.  Dietary follow-up 15.  Hyperlipidemia.  Crestor  16.  Hypothyroidism.  Synthroid  17.  Chronic constipation/ileus.  Linzess  72 mcg daily, Senokot S2 tablets bedtime, Chronulac  as needed  6/13- LBM 2 days ago- she says doesn't go every day  6/15 I suspect her nausea is related to this. We also started fosamax  yesterday. Discussed how oxycodone  can cause nausea and constipation   -  KUB today reveals moderate stool burden, no ileus   -will order fleets enema   -rx nausea, increase ppi to bid   -increase senna to bid  6/16 reports good results with enema yesterday, continue to monitor   6/17 LBM 6/15 consider additional medication if no BM by then  6/18 Patient reports  BM yesterday, continue to monitor   6/20 Sorbitol  ordered this AM- no results will order xray abdomen and Enema- discussed with nursing- had BM with this, add miralax   Small, type I bowel movement 6/20  6/21: KUB with likely ileus--diffuse distention and abdominal pain with slow bowel sounds-- will consult surgery per addendum yesterday.  Started on maintenance IV fluids  6/22: Surgery evaluated yesterday, deemed nonop management, diet as tolerated.  Tolerated liquid diet overnight, will advance to soft today.  DC IV fluids.  Start Reglan  5 mg 3 times daily.  Getting soapsuds enema tonight given no BM for 2 days.  -- Given she is passing flatus, if adequate bowel movement tonight and good control of symptoms in AM, would consider advancement to full diet.  Will get KUB for a.m.  18.  GERD.  Protonix  19. Noncompliance- Per nursing, pt refusing to use TLSO for [facility] transfers, of note- will d/w pt that need to follow recs, or there's more risk of additional fractures.  20. Hypokalemia  6/13- will replete 40 mEq and then recheck Monday  6/18 stable K+ at 4.4  21. Azotemia  -6/18 Cr 1.04 encourage oral fluids-order placed  Recheck tomorrow --improved  22. Dysphagia  -SLP consult pending  LOS: 10 days A FACE TO FACE EVALUATION WAS PERFORMED  Kathleen [name] Likes 02/16/2024, 10:48 AM

## 2024-02-16 NOTE — Progress Notes (Signed)
 Occupational Therapy Session Note  Patient Details  Name: Kathleen [name] MRN: [id number] Date of Birth: 18-Sep-1960  Today's Date: 02/16/2024 OT Individual Time: 0800-0850 OT Individual Time Calculation (min): 50 min    Short Term Goals: Week 1:  OT Short Term Goal 1 (Week 1): Pt will perform stand pivot transfer to the toilet/ BSC with RW with min A consistently OT Short Term Goal 2 (Week 1): Pt will perform toileting with max A 2/3 steps OT Short Term Goal 3 (Week 1): Pt will perform bed mobility with min A from flat bed with bed rails OT Short Term Goal 4 (Week 1): Pt will be able to don LB clothing (underwear/ pants ) with mi nA  Skilled Therapeutic Interventions/Progress Updates:    Patient seated in the recliner at the time of arrival reporting a pain response of 10 for  her lower back. Patient went on to say that she had  difficulty sleeping on last night. I indicated to the patient that she needs to wear her TLSO for protection and she indicated that she understood and she would wear it. The pt was in agreement with bathing at chair LOF. The pt was able to wash her face, chest, arm, and midriff with s/uA.  The pt was able to bathe her peri area for the front and the upper  region of her legs with s/uA.  The pt was MaxA for the Lower leg and her bottom.  The pt was MinA for donning her deo and she was s/uA for brushing her teeth.  The pt was able to come from sit to stand during the bathing exercise 3x with MinA incorporating the RW.  The pt opted not to change her gown . At the end of the session, that pt  remained in the recliner with her call light and bedside table within reach and all additional needs addressed.    Therapy Documentation Precautions:  Precautions Precautions: Fall Recall of Precautions/Restrictions: Intact Precaution/Restrictions Comments: TLSO brace (when ambulating), pt refused to don at evaluation Required Braces or Orthoses: Spinal Brace Spinal Brace:  Thoracolumbosacral orthotic Restrictions Weight Bearing Restrictions Per Provider Order: No   Therapy/Group: Individual Therapy  Kathleen [name] 02/16/2024, 5:16 PM

## 2024-02-17 ENCOUNTER — Inpatient Hospital Stay (HOSPITAL_COMMUNITY)

## 2024-02-17 DIAGNOSIS — K567 Ileus, unspecified: Secondary | ICD-10-CM

## 2024-02-17 LAB — CBC
HCT: 28.8 % — ABNORMAL LOW (ref 36.0–46.0)
Hemoglobin: 8.5 g/dL — ABNORMAL LOW (ref 12.0–15.0)
MCH: 28.8 pg (ref 26.0–34.0)
MCHC: 29.5 g/dL — ABNORMAL LOW (ref 30.0–36.0)
MCV: 97.6 fL (ref 80.0–100.0)
Platelets: 407 10*3/uL — ABNORMAL HIGH (ref 150–400)
RBC: 2.95 MIL/uL — ABNORMAL LOW (ref 3.87–5.11)
RDW: 15.9 % — ABNORMAL HIGH (ref 11.5–15.5)
WBC: 7.2 10*3/uL (ref 4.0–10.5)
nRBC: 0.3 % — ABNORMAL HIGH (ref 0.0–0.2)

## 2024-02-17 LAB — BASIC METABOLIC PANEL WITH GFR
Anion gap: 4 — ABNORMAL LOW (ref 5–15)
BUN: <5 mg/dL — ABNORMAL LOW (ref 8–23)
CO2: 35 mmol/L — ABNORMAL HIGH (ref 22–32)
Calcium: 8.5 mg/dL — ABNORMAL LOW (ref 8.9–10.3)
Chloride: 100 mmol/L (ref 98–111)
Creatinine, Ser: 1 mg/dL (ref 0.44–1.00)
GFR, Estimated: >60 mL/min (ref >60)
Glucose, Bld: 99 mg/dL (ref 70–99)
Potassium: 3.5 mmol/L (ref 3.5–5.1)
Sodium: 139 mmol/L (ref 135–145)

## 2024-02-17 MED ORDER — POTASSIUM CHLORIDE CRYS ER 20 MEQ PO TBCR
20.0000 meq | EXTENDED_RELEASE_TABLET | Freq: Once | ORAL | Status: AC
  Administered 2024-02-17: 20 meq via ORAL
  Filled 2024-02-17: qty 1

## 2024-02-17 NOTE — Plan of Care (Signed)
  Problem: Sit to Stand Goal: LTG:  Patient will perform sit to stand in prep for activites of daily living with assistance level (OT) Description: LTG:  Patient will perform sit to stand in prep for activites of daily living with assistance level (OT) Outcome: Not Applicable Flowsheets (Taken 02/17/2024 1606) LTG: PT will perform sit to stand in prep for activites of daily living with assistance level: (D/C goal d/t variability with assistance level d/t height of surface) --

## 2024-02-17 NOTE — Progress Notes (Signed)
 Soap Suds enema administered. Patient tolerated well. Medium amount of Type I stool. Patient has no concerns at this time.

## 2024-02-17 NOTE — Progress Notes (Signed)
 Speech Language Pathology Daily Session Note  Patient Details  Name: Kathleen [name] MRN: [id number] Date of Birth: Apr 29, 1961  Today's Date: 02/17/2024 SLP Individual Time: 0800-0830 SLP Individual Time Calculation (min): 30 min  Short Term Goals: Week 1: SLP Short Term Goal 1 (Week 1): Pt will participate in functional swallow evaluation of regular textures SLP Short Term Goal 2 (Week 1): Pt will participate in instrumental swallow evaluation as needed to identify further recommendations as needed SLP Short Term Goal 3 (Week 1): Pt will verbalize understanding of dysphagia education given supervisionA  Skilled Therapeutic Interventions: Pt greeted at bedside for diagnostic tx tasks targeting diet tolerance given pt report of difficulty swallowing. She was yelling for assistance upon SLP arrival vs utilizing call button placed within her reach. SLP and RNT assisted to re position pt into a more upright position. However, it was difficult to achieve fully upright position in bed d/t pt's body habitus. Of note, pt placed on a soft diet this weekend d/t ongoing GI complications. PO trials completed w/ regular textures, as only 3 singular goldfish were consumed before pt declined additional trials. Pt presented w/ cough and slight gag x1. Immediately after this, pt reported I'm choking.  Upon clarification of symptoms, she reported difficulty breathing and noted to become increasingly anxious. However, O2 sats remained >90% and pt demonstrated no difficulty verbally responding to SLP. SLP provided education re anticipated esophageal dysphagia and pt was able to report that an MBSS was completed within the year. Of note, MBSS 06/2023 revealed adequate oropharyngeal function but notable esophageal dysphagia, including retrograde flow into the pharynx after the swallow. Since pt is reporting an increase in symptoms and PO intake is reduced pt reported fear of choking, recommend MBSS to further evaluate  current oropharyngeal swallow function and determine additional recs as needed. MBSS scheduled for 02/19/24.   Pain Pain Assessment Pain Scale: 0-10 Pain Score: 5  Pain Intervention(s): Medication (See eMAR)  Therapy/Group: Individual Therapy  [name] DELENA Mole 02/17/2024, 10:18 AM

## 2024-02-17 NOTE — Plan of Care (Signed)
  Problem: Consults Goal: RH SPINAL CORD INJURY PATIENT EDUCATION Description:  See Patient Education module for education specifics.  Outcome: Progressing   Problem: SCI BOWEL ELIMINATION Goal: RH STG MANAGE BOWEL WITH ASSISTANCE Description: STG Manage Bowel with toileting Assistance. Outcome: Progressing Goal: RH STG SCI MANAGE BOWEL WITH MEDICATION WITH ASSISTANCE Description: STG SCI Manage bowel with medication with mod I assistance. Outcome: Progressing Goal: RH STG SCI MANAGE BOWEL PROGRAM W/ASSIST OR AS APPROPRIATE Description: STG SCI Manage bowel program w/assist or as appropriate. Outcome: Progressing   Problem: SCI BLADDER ELIMINATION Goal: RH STG MANAGE BLADDER WITH ASSISTANCE Description: STG Manage Bladder With Assistance Outcome: Progressing Goal: RH STG MANAGE BLADDER WITH MEDICATION WITH ASSISTANCE Description: STG Manage Bladder With Medication With mod I Assistance. Outcome: Progressing Goal: RH STG SCI MANAGE BLADDER PROGRAM W/ASSISTANCE Description: Toileting protocol Outcome: Progressing   Problem: RH SAFETY Goal: RH STG ADHERE TO SAFETY PRECAUTIONS W/ASSISTANCE/DEVICE Description: STG Adhere to Safety Precautions With cues Assistance/Device. Outcome: Progressing   Problem: RH KNOWLEDGE DEFICIT SCI Goal: RH STG INCREASE KNOWLEDGE OF SELF CARE AFTER SCI Description: Patient and spouse will be able to manage care at discharge using educational resources for medication, skin care, transfers, toileting, dietary modification independently Outcome: Progressing

## 2024-02-17 NOTE — Progress Notes (Signed)
 Physical Therapy Session Note  Patient Details  Name: Kathleen [name] MRN: [id number] Date of Birth: 12/02/1960  Today's Date: 02/17/2024 PT Individual Time: 1100-1200 PT Individual Time Calculation (min): 60 min  and Today's Date: 02/17/2024 PT Group Time:  -     Short Term Goals: Week 1:  PT Short Term Goal 1 (Week 1): Pt will complete rolling in bed with supervision PT Short Term Goal 1 - Progress (Week 1): Met PT Short Term Goal 2 (Week 1): Pt will complete supine<->sit with CGA. PT Short Term Goal 2 - Progress (Week 1): Met PT Short Term Goal 3 (Week 1): Pt will complete sit to stand with CGA + LRAD PT Short Term Goal 3 - Progress (Week 1): Progressing toward goal PT Short Term Goal 4 (Week 1): Pt will maintain dynamic standing balance with minA + LRAD PT Short Term Goal 4 - Progress (Week 1): Progressing toward goal PT Short Term Goal 5 (Week 1): Pt will ambulate x75' with LRAD CGA Week 2:  PT Short Term Goal 1 (Week 2): Pt will maintain static standing balance up to x3' with LRAD  Skilled Therapeutic Interventions/Progress Updates:    Pt in bed when PT arrived. Pt agreeable for PT.  Pt wants to urinate, transferred to [facility] with RN in room for meds.  Pt reports 10/10 pain to LB.  Pt wheeled to gym with oxygen  tank.  Pt anxious and uncomfortable to with TSLO brace.  Pt ambulated with RW x 20', rest, x10', rest.  Pt becoming anxious and cueing for pursed lip breathing, try to relax and calm self. Pt becomes agitated and tries to remove brace.  Pt returned to room in w/c daughter present. Pt agreeable to perform LE therex seated: step outs, marching ankle df/pf, LAQs; 2x10 each.  Pt willing to participate again with PT after lunch. All items in reach for patient.   Therapy Documentation Precautions:  Precautions Precautions: Fall Recall of Precautions/Restrictions: Intact Precaution/Restrictions Comments: TLSO brace (when ambulating), pt refused to don at evaluation Required Braces or  Orthoses: Spinal Brace Spinal Brace: Thoracolumbosacral orthotic Restrictions Weight Bearing Restrictions Per Provider Order: No  Pain: Pain Assessment Pain Score: 10     Therapy/Group: Individual Therapy  [name] Fast 02/17/2024, 3:15 PM

## 2024-02-17 NOTE — Progress Notes (Signed)
 Physical Therapy Session Note  Patient Details  Name: Kathleen [name] MRN: [id number] Date of Birth: Mar 18, 1961  Today's Date: 02/17/2024 PT Individual Time: 1400-1500 PT Individual Time Calculation (min): 60 min   Short Term Goals: Week 1:  PT Short Term Goal 1 (Week 1): Pt will complete rolling in bed with supervision PT Short Term Goal 1 - Progress (Week 1): Met PT Short Term Goal 2 (Week 1): Pt will complete supine<->sit with CGA. PT Short Term Goal 2 - Progress (Week 1): Met PT Short Term Goal 3 (Week 1): Pt will complete sit to stand with CGA + LRAD PT Short Term Goal 3 - Progress (Week 1): Progressing toward goal PT Short Term Goal 4 (Week 1): Pt will maintain dynamic standing balance with minA + LRAD PT Short Term Goal 4 - Progress (Week 1): Progressing toward goal PT Short Term Goal 5 (Week 1): Pt will ambulate x75' with LRAD CGA Week 2:  PT Short Term Goal 1 (Week 2): Pt will maintain static standing balance up to x3' with LRAD  Skilled Therapeutic Interventions/Progress Updates:    Pt in bed when PT arrived. Pt agreeable for PT.  Pt reports pain is 10/10, it's always 10/10. TSLO brace donned with gait belt.  Pt able to perform supine to sit transfers using railing for support with supervision.  Bed->BSC xfers min assist, BSC->w/c, min assist;  w/c<->toilet mod assist.   Pt wheeled to gym with oxygen  tank.  Sit to stand xfers from w/c<->RW mod/max assist x2.  Standing static balance good with RW.  Gait 4 trials ~5-8' in distances with standing/sitting rest break, pursed lip breathing cues.  Pt reporting having a panic attack during session, but able to calm self near end of session. Pt returned to room, transferring to bed with x2 ppl transfer to [facility], items in reach.  RN present to reapply pt's finger O2 sensor at end of tx.   Therapy Documentation Precautions:  Precautions Precautions: Fall Recall of Precautions/Restrictions: Intact Precaution/Restrictions Comments: TLSO  brace (when ambulating), pt refused to don at evaluation Required Braces or Orthoses: Spinal Brace Spinal Brace: Thoracolumbosacral orthotic Restrictions Weight Bearing Restrictions Per Provider Order: No  Pain: Pain Assessment Pain Scale: 0-10 Pain Score: 10 Pain Location: Back Pain Intervention(s): Medication (See eMAR)     Therapy/Group: Individual Therapy  [name] Fast 02/17/2024, 3:06 PM

## 2024-02-17 NOTE — Progress Notes (Signed)
 Occupational Therapy Weekly Progress Note  Patient Details  Name: Kathleen [name] MRN: [id number] Date of Birth: October 17, 1960  Beginning of progress report period: February 07, 2024 End of progress report period: February 17, 2024  Today's Date: 02/17/2024 OT Individual Time: 0900-1000 OT Individual Time Calculation (min): 60 min    Patient has met 4 of 4 short term goals.  Pt has been steadily increasing progress with goals throughout IPR stay. Pt able to utilize AE for increased independence with ADLs such as bathing and dressing. Pt still limited by pain and anxiety requiring reassurance throughout treatment session.   Patient continues to demonstrate the following deficits: muscle weakness and muscle joint tightness, decreased cardiorespiratoy endurance and decreased oxygen  support, and decreased standing balance, decreased postural control, decreased balance strategies, and difficulty maintaining precautions and therefore will continue to benefit from skilled OT intervention to enhance overall performance with BADL and Reduce care partner burden.  Patient progressing toward long term goals..  Continue plan of care.  OT Short Term Goals Week 1:  OT Short Term Goal 1 (Week 1): Pt will perform stand pivot transfer to the toilet/ BSC with RW with min A consistently OT Short Term Goal 1 - Progress (Week 1): Met OT Short Term Goal 2 (Week 1): Pt will perform toileting with max A 2/3 steps OT Short Term Goal 2 - Progress (Week 1): Met OT Short Term Goal 3 (Week 1): Pt will perform bed mobility with min A from flat bed with bed rails OT Short Term Goal 3 - Progress (Week 1): Met OT Short Term Goal 4 (Week 1): Pt will be able to don LB clothing (underwear/ pants ) with mi nA OT Short Term Goal 4 - Progress (Week 1): Met Week 2:  OT Short Term Goal 1 (Week 2): STG=LTG d/t ELOS  Skilled Therapeutic Interventions/Progress Updates:      Therapy Documentation Precautions:  Precautions Precautions:  Fall Recall of Precautions/Restrictions: Intact Precaution/Restrictions Comments: TLSO brace (when ambulating), pt refused to don at evaluation Required Braces or Orthoses: Spinal Brace Spinal Brace: Thoracolumbosacral orthotic Restrictions Weight Bearing Restrictions Per Provider Order: No General: I am in a lot of pain today." Pt supine in bed upon OT arrival, agreeable to OT session.  Pain:  10/10 pain reported in back, activity, intermittent rest breaks, distractions provided for pain management, pt reports tolerable to proceed.   ADL: OT providing skilled intervention on ADL retraining in order to increase independence with tasks and increase activity tolerance. Pt completed the following tasks at the current level of assist: Bed mobility: SBA using bed rails and HOB raised, using gait belt tied to end of bed to pull up Grooming/oral hygiene: SBA seated at sink to groom hair and brush teeth UB dressing: total A to don TLSO seated EOB LB dressing: OT providing skilled intervention on simulated LB dressing tasks using theraband and reacher, pt able to complete multiple trials of task with CGA overall in standing at RW in order to manage waist, OT providing education on intermittent support of UE on RW while managing pants above waist for increased balance strategies Footwear: Min A in order to don slip on shoes  Transfers: Mod A sit to stand from W/C, Min-CGA from increased surface and CGA stand pivot with RW   Pt supine in bed with bed alarm activated, 2 bed rails up, call light within reach and 4Ps assessed.   Therapy/Group: Individual Therapy  Camie Hoe, OTD, OTR/L 02/17/2024, 4:18 PM

## 2024-02-17 NOTE — Progress Notes (Signed)
 PROGRESS NOTE   Subjective/Complaints: Tolerating soft diet but has poor appetite still.  Has required additional straight cath intermittently for urinary retention, 600 mL at 6:30 AM.  She is having back pain.  She did have a bowel movement yesterday night.   Vital stable    02/17/2024    3:23 AM 02/16/2024    8:32 PM 02/16/2024    3:09 PM  Vitals with BMI  Systolic 122 115 885  Diastolic 54 59 56  Pulse 90 87 90    ROS: Patient denies fever, rash, vomiting, diarrhea, shortness of breath or chest pain,headache  + back pain-continued  + anxiety- a little improved + dysuria + constipation-improved + Abdominal pain   Objective:   DG Abd 1 View Result Date: 02/17/2024 CLINICAL DATA:  Ileus. EXAM: ABDOMEN - 1 VIEW COMPARISON:  Radiograph dated 02/15/2024. FINDINGS: Evaluation is limited due to body habitus and soft tissue attenuation. No bowel dilatation or evidence of obstruction. There is mild colonic stool burden. Right upper quadrant cholecystectomy clips. Degenerative changes of the spine. No acute osseous pathology. IMPRESSION: Nonobstructive bowel gas pattern. Electronically Signed   By: Vanetta Chou M.D.   On: 02/17/2024 11:36    Recent Labs    02/15/24 0720 02/17/24 0325  WBC 7.9 7.2  HGB 8.5* 8.5*  HCT 29.4* 28.8*  PLT 448* 407*    Recent Labs    02/15/24 0720 02/17/24 0325  NA 140 139  K 3.5 3.5  CL 97* 100  CO2 35* 35*  GLUCOSE 92 99  BUN <5* <5*  CREATININE 0.83 1.00  CALCIUM  8.5* 8.5*     Intake/Output Summary (Last 24 hours) at 02/17/2024 1256 Last data filed at 02/17/2024 0630 Gross per 24 hour  Intake 549.92 ml  Output 950 ml  Net -400.08 ml        Physical Exam: Vital Signs Blood pressure (!) 122/54, pulse 90, temperature 98.1 F (36.7 C), resp. rate 18, height 5' 10 (1.778 m), weight 124.9 kg, last menstrual period 05/17/2016, SpO2 94%.  Constitutional: No apparent  distress. Appropriate appearance for age.  Obese.  Laying in bed HENT: No JVD. Neck Supple. Trachea midline. Atraumatic, normocephalic. Eyes: PERRLA. EOMI. Visual fields grossly intact.  Cardiovascular: RRR, no murmurs/rub/gallops.  2+ right lower extremity, 1+ left lower extremity edema. Peripheral pulses 2+  Respiratory: CTAB. No rales, rhonchi, or wheezing. On 3 L nasal cannula. Abdomen: + bowel sounds, hypoactive.  Distention and tenderness in the left upper and lower quadrant, without guarding or palpable deformity.   Skin: C/D/I. No apparent lesions. MSK:      No apparent deformity.       Neurologic exam:  Awake, alert, oriented x 4.  No apparent deficits.  Cranial nerve exam 2 through 12 intact.  Intact to light touch throughout. Ue's5-/5 in Deltoids, biceps, triceps; WE 4/5- give way weakness; Grip 4+/5 and FA 4+/5 B/L LE's- 5-/5 throughout B/L      Assessment/Plan: 1. Functional deficits which require 3+ hours per day of interdisciplinary therapy in a comprehensive inpatient rehab setting. Physiatrist is providing close team supervision and 24 hour management of active medical problems listed below. Physiatrist and rehab team  continue to assess barriers to discharge/monitor patient progress toward functional and medical goals  Care Tool:  Bathing    Body parts bathed by patient: Left arm, Right arm, Abdomen, Chest, Face   Body parts bathed by helper: Front perineal area, Buttocks, Right upper leg, Left upper leg, Right lower leg, Left lower leg     Bathing assist Assist Level: Maximal Assistance - Patient 24 - 49%     Upper Body Dressing/Undressing Upper body dressing   What is the patient wearing?: Orthosis Orthosis activity level: Performed by helper  Upper body assist Assist Level: Total Assistance - Patient < 25%    Lower Body Dressing/Undressing Lower body dressing      What is the patient wearing?: Incontinence brief, Pants     Lower body assist Assist  for lower body dressing: Total Assistance - Patient < 25%     Toileting Toileting    Toileting assist Assist for toileting: Total Assistance - Patient < 25%     Transfers Chair/bed transfer  Transfers assist     Chair/bed transfer assist level: Contact Guard/Touching assist     Locomotion Ambulation   Ambulation assist      Assist level: Contact Guard/Touching assist Assistive device: Walker-rolling Max distance: x88'   Walk 10 feet activity   Assist  Walk 10 feet activity did not occur: Safety/medical concerns  Assist level: Contact Guard/Touching assist Assistive device: Walker-rolling   Walk 50 feet activity   Assist Walk 50 feet with 2 turns activity did not occur: Safety/medical concerns  Assist level: Contact Guard/Touching assist Assistive device: Walker-rolling    Walk 150 feet activity   Assist Walk 150 feet activity did not occur: Safety/medical concerns (pt too fatigued, SOB)         Walk 10 feet on uneven surface  activity   Assist Walk 10 feet on uneven surfaces activity did not occur: Safety/medical concerns         Wheelchair     Assist Is the patient using a wheelchair?: Yes Type of Wheelchair: Manual Wheelchair activity did not occur: Safety/medical concerns  Wheelchair assist level: Dependent - Patient 0% Max wheelchair distance: 2x220'    Wheelchair 50 feet with 2 turns activity    Assist    Wheelchair 50 feet with 2 turns activity did not occur: Safety/medical concerns   Assist Level: Dependent - Patient 0%   Wheelchair 150 feet activity     Assist  Wheelchair 150 feet activity did not occur: Safety/medical concerns   Assist Level: Dependent - Patient 0%   Blood pressure (!) 122/54, pulse 90, temperature 98.1 F (36.7 C), resp. rate 18, height 5' 10 (1.778 m), weight 124.9 kg, last menstrual period 05/17/2016, SpO2 94%.  Medical Problem List and Plan: 1. Functional deficits secondary to  compression and multiple fractures of spine without SCI-intractable acute on chronic low back pain due to osteoporotic fracture involving L1, L3 and L4/acute left pubic symphysis and right sacral ala fracture.  Patient declined kyphoplasty.  Conservative management with narcotics and TLSO             -patient may  shower             -ELOS/Goals: 12-14 days- supervision to min A             -Continue CIR therapies including PT, OT   - L-spine x-ray worsening compression deformity L1 level  -Exp dc 7/1  - Team conference tomorrow  2.  Antithrombotics: -DVT/anticoagulation:  Pharmaceutical:  Lovenox .  Check vascular study 6/13- Dopplers (-)             -antiplatelet therapy: N/A 3. Pain Management/chronic pain followed at [facility] pain clinic- usually takes 10 mg 6x/day: Flexeril  5 mg 3 times daily, lidoderm  patch, oxycodone  30 mg every 4 hours as needed upon transfer- attempting, with d/w pt and husband, to add MS Contin  15 mg BID and reduce Oxycodone  to 20 mg q4 hours  6/13- changed to Oxycontin  10 mg BID and continue Oxy 20 mg q4 hours prn  6/16 increase oxycontin  to 15mg  Q12h  6/17 Increase OxyContin  to 20 mg every 12 hours 4. Mood/Behavior/Sleep:  Pt has severe anxiety Cymbalta  60 mg daily, BuSpar  15 mg 3 times daily, Atarax  25 mg 3 times daily as needed             -added Celexa  10 mg daily for anxiety- is better for anxiety than Duloxetine , but need Duloxetine  for pain control  - 6/17 seen by neuropsychology for anxiety with depression, appreciate assistance             -antipsychotic agents: N/A  5. Neuropsych/cognition: This patient is ?capable of making decisions on her own behalf. 6. Skin/Wound Care: Routine skin checks 7. Fluids/Electrolytes/Nutrition: Routine in and outs with follow-up chemistries  8.  Sarcoidosis/interstitial lung disease/chronic respiratory failure with hypoxia- Was on 4-5L at home- been doing 3-4L here- and concern for hypercarbia.  Continue chronic oxygen   therapy.  Prednisone  as directed, Lasix  20 mg Monday Wednesday Fridays.  Followed by Dr. Selinda Moots pulmonary services [facility]  6/14- some sob this morning but VS ok. ?anxiety component  -she is receiving brovana  and pulmicort  scheduled with albuterol  prn  -added IS/ and FV today  6/15 breathing better today. I asked her if she took prednisone  with hydrocortisone , and she told me she did at one point, but no longer.    -obsv for now  breathing doing ok- at basline, continue to monitor  Discussed Incentive spirometer SpO2: 94 % O2 Flow Rate (L/min): 4 L/min  6/22-3: Remains on 4 L nasal cannula, satting well.  9.  Left femoral head avascular necrosis.Probably has bilaterally, based on chart review  Not a surgical candidate.  Conservative care  6/13- went over with therapy- that her goals from my point of view is to walk 15-25 ft, but probably not more- will not make AVN worse, but will hurt more-   10.  E. coli UTI as well as chronic urinary incontinence.  Status post 3-day course of IV Rocephin  completed.  Continue Flomax  0.4 mg daily.  Check PVR  6/14-15 pt is voiding continently. No PVR's but I'm not sure they'reneeded right now  6/16 check PVRs, pt reports she doesn't empty completely - do not see any completed yet 6/19 recheck U/A and culture for dysuria, ask nursing completed PVRs  -Start keflex - monitor cultures- pseudomonas- sensitivities pending- Change to Levofloxacin  6/21: cx + pseudomonas; given n.p.o. will switch to IV cefepime  2 g q8h 6/22: Continence improving, continue IV cefepime  for now.  Discussed risks of Foley placement for incontinence given patient discomfort with transferring to bedside commode, she is agreeable to continuing this intermittent straight cathing as needed; husband has been trained on straight cathing in the past and is comfortable doing this at discharge if needed 6/23 continues with intermittent catheterizations, continue current regimen and  monitor    11.  Normocytic anemia.  Borderline MCV of 96 and RDW of 16.  B12 and folate within  normal limits.  Check CBC  6/14- CBC HGB stable at 8.3  6/17  HGB stable 8.5 12.  Addison's disease.  Continue hydrocortisone  chronically.  Followed by endocrinology services Dr.Gebreselassie Nida 13.  Chronic hypotension.  ProAmatine  5 mg twice daily    02/17/2024    3:23 AM 02/16/2024    8:32 PM 02/16/2024    3:09 PM  Vitals with BMI  Systolic 122 115 885  Diastolic 54 59 56  Pulse 90 87 90  Controlled continue current regimen  14.  Class III morbid obesity.  BMI 39.75.  Dietary follow-up 15.  Hyperlipidemia.  Crestor  16.  Hypothyroidism.  Synthroid  17.  Chronic constipation/ileus.  Linzess  72 mcg daily, Senokot S2 tablets bedtime, Chronulac  as needed  6/13- LBM 2 days ago- she says doesn't go every day  6/15 I suspect her nausea is related to this. We also started fosamax  yesterday. Discussed how oxycodone  can cause nausea and constipation   -  KUB today reveals moderate stool burden, no ileus   -will order fleets enema   -rx nausea, increase ppi to bid   -increase senna to bid  6/16 reports good results with enema yesterday, continue to monitor   6/17 LBM 6/15 consider additional medication if no BM by then  6/18 Patient reports BM yesterday, continue to monitor   6/20 Sorbitol  ordered this AM- no results will order xray abdomen and Enema- discussed with nursing- had BM with this, add miralax   Small, type I bowel movement 6/20  6/21: KUB with likely ileus--diffuse distention and abdominal pain with slow bowel sounds-- will consult surgery per addendum yesterday.  Started on maintenance IV fluids  6/22: Surgery evaluated yesterday, deemed nonop management, diet as tolerated.  Tolerated liquid diet overnight, will advance to soft today.  DC IV fluids.  Start Reglan  5 mg 3 times daily.  Getting soapsuds enema tonight given no BM for 2 days.  -- Given she is passing flatus, if adequate  bowel movement tonight and good control of symptoms in AM, would consider advancement to full diet.  Will get KUB for a.m.  6/23 continue soft diet today, KUB nonobstructive today.  Will hold off on increase of pain medications although she does have back pain as do not want to worsen this  18.  GERD.  Protonix  19. Noncompliance- Per nursing, pt refusing to use TLSO for [facility] transfers, of note- will d/w pt that need to follow recs, or there's more risk of additional fractures.  20. Hypokalemia  6/13- will replete 40 mEq and then recheck Monday  6/23 low normal potassium at 3.5, will add 20 mEq x 1  21. Azotemia  -6/18 Cr 1.04 encourage oral fluids-order placed  -6/23 --stable creatinine 1.0, encourage oral intake  22. Dysphagia  -SLP consult completed  LOS: 11 days A FACE TO FACE EVALUATION WAS PERFORMED  Murray Collier 02/17/2024, 12:56 PM

## 2024-02-18 ENCOUNTER — Ambulatory Visit: Admitting: Urology

## 2024-02-18 DIAGNOSIS — R339 Retention of urine, unspecified: Secondary | ICD-10-CM

## 2024-02-18 MED ORDER — TAMSULOSIN HCL 0.4 MG PO CAPS
0.8000 mg | ORAL_CAPSULE | Freq: Every day | ORAL | Status: DC
  Administered 2024-02-18 – 2024-02-24 (×7): 0.8 mg via ORAL
  Filled 2024-02-18 (×7): qty 2

## 2024-02-18 MED ORDER — POLYETHYLENE GLYCOL 3350 17 G PO PACK
17.0000 g | PACK | Freq: Three times a day (TID) | ORAL | Status: DC
  Administered 2024-02-18 – 2024-02-23 (×13): 17 g via ORAL
  Filled 2024-02-18 (×21): qty 1

## 2024-02-18 MED ORDER — SORBITOL 70 % SOLN
30.0000 mL | Freq: Every day | Status: DC | PRN
  Administered 2024-02-21: 30 mL via ORAL
  Filled 2024-02-18 (×3): qty 30

## 2024-02-18 NOTE — Progress Notes (Signed)
 Per husband he has been doing in and out cath for wife before admission. They have supplies at home.

## 2024-02-18 NOTE — Progress Notes (Addendum)
 Occupational Therapy Session Note  Patient Details  Name: Kathleen [name] MRN: [id number] Date of Birth: Oct 14, 1960  Today's Date: 02/18/2024 OT Individual Time: 1100-1130 & 30 min OT Individual Time Calculation (min): 30 min  & 30 min and Today's Date: 02/18/2024 OT Missed Time: 15 Minutes & 15 min Missed Time Reason: Nursing care (cathing), pain/fatigue   Short Term Goals: Week 2:  OT Short Term Goal 1 (Week 2): STG=LTG d/t ELOS  Skilled Therapeutic Interventions/Progress Updates:      Therapy Documentation Precautions:  Precautions Precautions: Fall Recall of Precautions/Restrictions: Intact Precaution/Restrictions Comments: TLSO brace (when ambulating), pt refused to don at evaluation Required Braces or Orthoses: Spinal Brace Spinal Brace: Thoracolumbosacral orthotic Restrictions Weight Bearing Restrictions Per Provider Order: No General: Pt supine in bed upon OT arrival, agreeable to OT session.  Pain: 10/10 pain reported in low back, activity, intermittent rest breaks, distractions provided for pain management, pt reports tolerable to proceed.   ADL: OT providing skilled intervention on ADL retraining in order to increase independence with tasks and increase activity tolerance. Pt completed the following tasks at the current level of assist: Bed mobility: overall SBA for supine><EOB and sitting, pt using RW for stability to scoot to [facility] at SBA while seated on EOB  Exercises: OT instructing pt on completing the following exercise circuit in order to improve functional activity, strength and endurance to prepare for ADLs such as bathing. Pt completed the following exercises in EOB position with no noted LOB/SOB and 3x10 repetitions on each exercise: -triceps extensions -forward punches -shoulder flexion   Other Treatments: OT educating pt on coping strategies and stress management techniques in order to decrease anxious behaviors during ADLs and increase independence with  tasks.   OT donning aquathermia pack after session once in bed. Pt supine in bed with bed alarm activated, 2 bed rails up, call light within reach and 4Ps assessed.      Session 2 General:  Pt supine in bed upon OT arrival, agreeable to OT session. Husband Cheryl present  Pain: 10/10 pain reported in low back, activity, intermittent rest breaks, distractions provided for pain management, pt reports tolerable to proceed.   ADL: OT using session for education for D/C planning and family education for husband. Pt and care partner educated on functional level of assistance for ADLs and functional mobility/transfers. Pt educated specifically on CARE tool levels of assistance provided at current status and level of assistance for OT goals once at D/C. It is recommended care partner to complete family training in order to provide increase understanding of level of assistance necessary once D/C. Husband will be completing official family training Friday 6/27 for D/C prep. OT discussing barrier of anxiety/coping strategies for husband for increased anxiety moments during ADL tasks and potential triggers for anxiety. Husband receptive with information.   Pt supine in bed with bed alarm activated, 2 bed rails up, call light within reach and 4Ps assessed. Missed 15 min d/t fatigue/pain. Husband present   Therapy/Group: Individual Therapy  Camie Hoe, OTD, OTR/L 02/18/2024, 3:49 PM

## 2024-02-18 NOTE — Progress Notes (Signed)
 Physical Therapy Session Note  Patient Details  Name: Kathleen [name] MRN: [id number] Date of Birth: 1960/12/17  Today's Date: 02/18/2024 PT Individual Time: 1330-1400 PT Individual Time Calculation (min): 30 min   Short Term Goals: Week 1:  PT Short Term Goal 1 (Week 1): Pt will complete rolling in bed with supervision PT Short Term Goal 1 - Progress (Week 1): Met PT Short Term Goal 2 (Week 1): Pt will complete supine<->sit with CGA. PT Short Term Goal 2 - Progress (Week 1): Met PT Short Term Goal 3 (Week 1): Pt will complete sit to stand with CGA + LRAD PT Short Term Goal 3 - Progress (Week 1): Progressing toward goal PT Short Term Goal 4 (Week 1): Pt will maintain dynamic standing balance with minA + LRAD PT Short Term Goal 4 - Progress (Week 1): Progressing toward goal PT Short Term Goal 5 (Week 1): Pt will ambulate x75' with LRAD CGA Week 2:  PT Short Term Goal 1 (Week 2): Pt will maintain static standing balance up to x3' with LRAD  Skilled Therapeutic Interventions/Progress Updates:    Session 1: Pt in bed when PT arrived. Pt states she did not sleep well last night, panic attack around 3 am; thought she was having an MI.  Today, PT focusing on home environment functional mobility and discussed with pt to attempt to perform transfers from bed, BSC with RW.  Pt able to demonstrate supervision with bed->BSC->bed->w/c->bed with RW, bed railings.  Pt performed bed exercises in recumbent position: LE heel slides, hip abd/add, SAQ; with 0.5# ankle wts 3x10, DF/PF with green TB 2x15, hip abd/clams with green tb above knees 2x15. Trunk control/strengthening/balance sitting on EOB with back unsupported and UE support, LAQs, marching, ankle DF/PF x10-15 reps. Pt requires multiple rest break, pursed lip breathing cues.   Session 2: Pt asleep in bed, moderate tactile, verbal arousal needed. Pt willing to participate with PT.  Pt requesting to use BSC. Pt able to demonstrate supervision with  bed<->BSC transfer with RW. PT encouraged pt to start sitting upright in w/c more often and to limit bed use, pt agreed and transferred from bed->w/c with supervision. Pt c/o feeling nauseous, wanting anti nausea meds, RN notified of request.  Pt left in w/c with husband present in room.     Therapy Documentation Precautions:  Precautions Precautions: Fall Recall of Precautions/Restrictions: Intact Precaution/Restrictions Comments: TLSO brace (when ambulating), pt refused to don at evaluation Required Braces or Orthoses: Spinal Brace Spinal Brace: Thoracolumbosacral orthotic Restrictions Weight Bearing Restrictions Per Provider Order: No  Pain: Pain Assessment Pain Scale: 0-10 Pain Score: 10-Worst pain ever Pain Location: Back Pain Intervention(s): Medication (See eMAR)     Therapy/Group: Individual Therapy  [name] Fast 02/18/2024, 3:54 PM

## 2024-02-18 NOTE — Progress Notes (Signed)
 PROGRESS NOTE   Subjective/Complaints: She would like regular diet. Doesn't like soft diet. Reports panic attacks at night. Has had chronic urinary retention. Continues to have lower back pain.    Vital stable    02/18/2024    4:06 AM 02/17/2024    8:24 PM 02/17/2024    8:19 PM  Vitals with BMI  Systolic 116 130 859  Diastolic 71 74 70  Pulse 89 61 171    ROS: Patient denies fever, rash, vomiting, diarrhea, shortness of breath or chest pain,headache  + back pain-continued  + anxiety- a little improved +Chronic urinary retention + constipation-improved + Abdominal pain   Objective:   DG Abd 1 View Result Date: 02/17/2024 CLINICAL DATA:  Ileus. EXAM: ABDOMEN - 1 VIEW COMPARISON:  Radiograph dated 02/15/2024. FINDINGS: Evaluation is limited due to body habitus and soft tissue attenuation. No bowel dilatation or evidence of obstruction. There is mild colonic stool burden. Right upper quadrant cholecystectomy clips. Degenerative changes of the spine. No acute osseous pathology. IMPRESSION: Nonobstructive bowel gas pattern. Electronically Signed   By: Vanetta Chou M.D.   On: 02/17/2024 11:36    Recent Labs    02/17/24 0325  WBC 7.2  HGB 8.5*  HCT 28.8*  PLT 407*    Recent Labs    02/17/24 0325  NA 139  K 3.5  CL 100  CO2 35*  GLUCOSE 99  BUN <5*  CREATININE 1.00  CALCIUM  8.5*     Intake/Output Summary (Last 24 hours) at 02/18/2024 1146 Last data filed at 02/18/2024 1102 Gross per 24 hour  Intake 960 ml  Output 1050 ml  Net -90 ml        Physical Exam: Vital Signs Blood pressure 116/71, pulse 89, temperature 98 F (36.7 C), temperature source Oral, resp. rate 18, height 5' 10 (1.778 m), weight 124.9 kg, last menstrual period 05/17/2016, SpO2 94%.  Constitutional: No apparent distress. Appropriate appearance for age.  Obese.  Laying in bed, appears comfortable HENT: No JVD. Neck Supple. Trachea  midline. Atraumatic, normocephalic. Eyes: PERRLA. EOMI. Visual fields grossly intact.  Cardiovascular: RRR, no murmurs/rub/gallops.  2+ right lower extremity, 1+ left lower extremity edema. Peripheral pulses 2+  Respiratory: CTAB. No rales, rhonchi, or wheezing. On 4 L nasal cannula. Abdomen: + bowel sounds, hypoactive.  Distention, no TTP, without guarding or palpable deformity.   Skin: C/D/I. No apparent lesions. MSK:      No apparent deformity.       Neurologic exam:  Awake, alert, oriented x 4.  No apparent deficits.  Cranial nerve exam 2 through 12 intact.  Intact to light touch throughout. Ue's5-/5 in Deltoids, biceps, triceps; WE 4/5- give way weakness; Grip 4+/5 and FA 4+/5 B/L LE's- 5-/5 throughout B/L   Prior neuro assessment is c/w today's exam 02/18/2024.      Assessment/Plan: 1. Functional deficits which require 3+ hours per day of interdisciplinary therapy in a comprehensive inpatient rehab setting. Physiatrist is providing close team supervision and 24 hour management of active medical problems listed below. Physiatrist and rehab team continue to assess barriers to discharge/monitor patient progress toward functional and medical goals  Care Tool:  Bathing  Body parts bathed by patient: Left arm, Right arm, Abdomen, Chest, Face   Body parts bathed by helper: Front perineal area, Buttocks, Right upper leg, Left upper leg, Right lower leg, Left lower leg     Bathing assist Assist Level: Minimal Assistance - Patient > 75%     Upper Body Dressing/Undressing Upper body dressing   What is the patient wearing?: Orthosis, Pull over shirt Orthosis activity level: Performed by helper  Upper body assist Assist Level: Supervision/Verbal cueing    Lower Body Dressing/Undressing Lower body dressing      What is the patient wearing?: Incontinence brief, Pants     Lower body assist Assist for lower body dressing: Minimal Assistance - Patient > 75%      Toileting Toileting    Toileting assist Assist for toileting: Minimal Assistance - Patient > 75%     Transfers Chair/bed transfer  Transfers assist     Chair/bed transfer assist level: Moderate Assistance - Patient 50 - 74% (low surface)     Locomotion Ambulation   Ambulation assist      Assist level: Contact Guard/Touching assist Assistive device: Walker-rolling Max distance: x88'   Walk 10 feet activity   Assist  Walk 10 feet activity did not occur: Safety/medical concerns  Assist level: Contact Guard/Touching assist Assistive device: Walker-rolling   Walk 50 feet activity   Assist Walk 50 feet with 2 turns activity did not occur: Safety/medical concerns  Assist level: Contact Guard/Touching assist Assistive device: Walker-rolling    Walk 150 feet activity   Assist Walk 150 feet activity did not occur: Safety/medical concerns (pt too fatigued, SOB)         Walk 10 feet on uneven surface  activity   Assist Walk 10 feet on uneven surfaces activity did not occur: Safety/medical concerns         Wheelchair     Assist Is the patient using a wheelchair?: Yes Type of Wheelchair: Manual Wheelchair activity did not occur: Safety/medical concerns  Wheelchair assist level: Supervision/Verbal cueing Max wheelchair distance: x8'    Wheelchair 50 feet with 2 turns activity    Assist    Wheelchair 50 feet with 2 turns activity did not occur: Safety/medical concerns   Assist Level: Dependent - Patient 0%   Wheelchair 150 feet activity     Assist  Wheelchair 150 feet activity did not occur: Safety/medical concerns   Assist Level: Dependent - Patient 0%   Blood pressure 116/71, pulse 89, temperature 98 F (36.7 C), temperature source Oral, resp. rate 18, height 5' 10 (1.778 m), weight 124.9 kg, last menstrual period 05/17/2016, SpO2 94%.  Medical Problem List and Plan: 1. Functional deficits secondary to compression and  multiple fractures of spine without SCI-intractable acute on chronic low back pain due to osteoporotic fracture involving L1, L3 and L4/acute left pubic symphysis and right sacral ala fracture.  Patient declined kyphoplasty.  Conservative management with narcotics and TLSO             -patient may  shower             -ELOS/Goals: 12-14 days- supervision to min A             -Continue CIR therapies including PT, OT   - L-spine x-ray worsening compression deformity L1 level  -Exp dc 7/1  -Team conference today please see physician documentation under team conference tab, met with team  to discuss problems,progress, and goals. Formulized individual treatment plan based on medical history,  underlying problem and comorbidities.    2.  Antithrombotics: -DVT/anticoagulation:  Pharmaceutical: Lovenox .  Check vascular study 6/13- Dopplers (-)             -antiplatelet therapy: N/A 3. Pain Management/chronic pain followed at [facility] pain clinic- usually takes 10 mg 6x/day: Flexeril  5 mg 3 times daily, lidoderm  patch, oxycodone  30 mg every 4 hours as needed upon transfer- attempting, with d/w pt and husband, to add MS Contin  15 mg BID and reduce Oxycodone  to 20 mg q4 hours  6/13- changed to Oxycontin  10 mg BID and continue Oxy 20 mg q4 hours prn  6/16 increase oxycontin  to 15mg  Q12h  6/17 Increase OxyContin  to 20 mg every 12 hours 4. Mood/Behavior/Sleep:  Pt has severe anxiety Cymbalta  60 mg daily, BuSpar  15 mg 3 times daily, Atarax  25 mg 3 times daily as needed             -added Celexa  10 mg daily for anxiety- is better for anxiety than Duloxetine , but need Duloxetine  for pain control  - 6/17 seen by neuropsychology for anxiety with depression, appreciate assistance             -antipsychotic agents: N/A  -6/24 Consider psychiatry consult for anxiety, she would like to hold off on this for now  5. Neuropsych/cognition: This patient is ?capable of making decisions on her own behalf. 6. Skin/Wound  Care: Routine skin checks 7. Fluids/Electrolytes/Nutrition: Routine in and outs with follow-up chemistries  8.  Sarcoidosis/interstitial lung disease/chronic respiratory failure with hypoxia- Was on 4-5L at home- been doing 3-4L here- and concern for hypercarbia.  Continue chronic oxygen  therapy.  Prednisone  as directed, Lasix  20 mg Monday Wednesday Fridays.  Followed by Dr. Selinda Moots pulmonary services [facility]  6/14- some sob this morning but VS ok. ?anxiety component  -she is receiving brovana  and pulmicort  scheduled with albuterol  prn  -added IS/ and FV today  6/15 breathing better today. I asked her if she took prednisone  with hydrocortisone , and she told me she did at one point, but no longer.    -obsv for now  breathing doing ok- at basline, continue to monitor  Discussed Incentive spirometer SpO2: 94 % O2 Flow Rate (L/min): 4 L/min 6/22-4: Remains on 4 L nasal cannula, satting well.  9.  Left femoral head avascular necrosis.Probably has bilaterally, based on chart review  Not a surgical candidate.  Conservative care  6/13- went over with therapy- that her goals from my point of view is to walk 15-25 ft, but probably not more- will not make AVN worse, but will hurt more-   10.  E. coli UTI as well as chronic urinary incontinence.  Status post 3-day course of IV Rocephin  completed.  Continue Flomax  0.4 mg daily.  Check PVR  6/14-15 pt is voiding continently. No PVR's but I'm not sure they'reneeded right now  6/16 check PVRs, pt reports she doesn't empty completely - do not see any completed yet 6/19 recheck U/A and culture for dysuria, ask nursing completed PVRs  -Start keflex - monitor cultures- pseudomonas- sensitivities pending- Change to Levofloxacin  6/21: cx + pseudomonas; given n.p.o. will switch to IV cefepime  2 g q8h 6/22: Continence improving, continue IV cefepime  for now.  Discussed risks of Foley placement for incontinence given patient discomfort with transferring to bedside  commode, she is agreeable to continuing this intermittent straight cathing as needed; husband has been trained on straight cathing in the past and is comfortable doing this at discharge if needed 6/23 continues  with intermittent catheterizations, continue current regimen and monitor 6/24 increase flomax  to 0.8mg     11.  Normocytic anemia.  Borderline MCV of 96 and RDW of 16.  B12 and folate within normal limits.  Check CBC  6/14- CBC HGB stable at 8.3  6/17  HGB stable 8.5 12.  Addison's disease.  Continue hydrocortisone  chronically.  Followed by endocrinology services Dr.Gebreselassie Nida 13.  Chronic hypotension.  ProAmatine  5 mg twice daily    02/18/2024    4:06 AM 02/17/2024    8:24 PM 02/17/2024    8:19 PM  Vitals with BMI  Systolic 116 130 859  Diastolic 71 74 70  Pulse 89 61 171  Controlled continue current regimen  14.  Class III morbid obesity.  BMI 39.75.  Dietary follow-up 15.  Hyperlipidemia.  Crestor  16.  Hypothyroidism.  Synthroid  17.  Chronic constipation/ileus.  Linzess  72 mcg daily, Senokot S2 tablets bedtime, Chronulac  as needed  6/13- LBM 2 days ago- she says doesn't go every day  6/15 I suspect her nausea is related to this. We also started fosamax  yesterday. Discussed how oxycodone  can cause nausea and constipation   -  KUB today reveals moderate stool burden, no ileus   -will order fleets enema   -rx nausea, increase ppi to bid   -increase senna to bid  6/16 reports good results with enema yesterday, continue to monitor   6/17 LBM 6/15 consider additional medication if no BM by then  6/18 Patient reports BM yesterday, continue to monitor   6/20 Sorbitol  ordered this AM- no results will order xray abdomen and Enema- discussed with nursing- had BM with this, add miralax   Small, type I bowel movement 6/20  6/21: KUB with likely ileus--diffuse distention and abdominal pain with slow bowel sounds-- will consult surgery per addendum yesterday.  Started on  maintenance IV fluids  6/22: Surgery evaluated yesterday, deemed nonop management, diet as tolerated.  Tolerated liquid diet overnight, will advance to soft today.  DC IV fluids.  Start Reglan  5 mg 3 times daily.  Getting soapsuds enema tonight given no BM for 2 days.  -- Given she is passing flatus, if adequate bowel movement tonight and good control of symptoms in AM, would consider advancement to full diet.  Will get KUB for a.m.  6/23 continue soft diet today, KUB nonobstructive today, mild stool burden.  Will hold off on increase of pain medications although she does have back pain as do not want to worsen this  6/24 upgrade diet to regular, consider DC reglan , ate 85% meal today, increase miralax  to TID  18.  GERD.  Protonix  19. Noncompliance- Per nursing, pt refusing to use TLSO for [facility] transfers, of note- will d/w pt that need to follow recs, or there's more risk of additional fractures.  20. Hypokalemia  6/13- will replete 40 mEq and then recheck Monday  6/23 low normal potassium at 3.5, will add 20 mEq x 1  21. Azotemia  -6/18 Cr 1.04 encourage oral fluids-order placed  -6/23 --stable creatinine 1.0, encourage oral intake  22. Dysphagia  -SLP consult completed  LOS: 12 days A FACE TO FACE EVALUATION WAS PERFORMED  Murray Collier 02/18/2024, 11:46 AM

## 2024-02-18 NOTE — Progress Notes (Signed)
 Patient ID: Kathleen [name], female   DOB: 04-Jun-1961, 63 y.o.   MRN: [id number] Met with pt, daughter and pt's husband who are present to give team conference update regarding progress this week in therapies. Goals still min-CGA level and discharge still 7/1. Pt aware her anxiety level determines her assistance level and if can get anxiety level down can do more. Have scheduled husband to come in Friday from 10-12 for family education. Will continue to work on discharge needs.

## 2024-02-18 NOTE — Patient Care Conference (Signed)
 Inpatient RehabilitationTeam Conference and Plan of Care Update Date: 02/18/2024   Time: 1146 am    Patient Name: Kathleen [name]      Medical Record Number: [id number]  Date of Birth: June 28, 1961 Sex: Female         Room/Bed: 4W06C/4W06C-01 Payor Info: Payor: [employer] / Plan: [employer] / Product Type: *No Product type* /    Admit Date/Time:  02/06/2024  2:28 PM  Primary Diagnosis:  Spinal compression fracture [facility])  Hospital Problems: Principal Problem:   Spinal compression fracture (HCC) Active Problems:   Anxiety and depression   Intractable back pain   Anxiety    Expected Discharge Date: Expected Discharge Date: 02/25/24  Team Members Present: Physician leading conference: Dr. Murray Collier Social Worker Present: Rhoda Clement, LCSW Nurse Present: Eulalio Falls, RN PT Present: Schuyler Batter, PT OT Present: Camie Hoe, OT     Current Status/Progress Goal Weekly Team Focus  Bowel/Bladder   continent of bowel, retaining of bladder  Urinary retention/ileus no fluid retention   bowel and bladder training    Swallow/Nutrition/ Hydration   regular/thin - anticipate esophageal dysphagia, however, planning for MBSS 6/25 to r/o pharyngeal dysphagia   modI  MBSS, pt/family edu    ADL's   CGA with reacher for LB ADLs, set up UB ADLs, Mod sit to stand from low surface, Min-CGA from raised surfaces from transfers   CGA LB ADLs, SBA UB ADLs, Min A-SBA for standing/transfers   safety awareness, anxiety/coping strategies during ADLs/transfers, family education    Mobility   mod/max assist x2 with w/c transfers due to low surface, bed to RW min assist.  Amb with RW CGA short distances due to decreased endurance, cardiorespiratory deficits, anxiety   standing tolerance supervision with assistive device, transfers min assist  strengthening, endurance, fall prevention, transfer safety    Communication                Safety/Cognition/  Behavioral Observations               Pain   back pain   pain free   medication    Skin   free from breakdown   no skin breakdown  turning and positioning every 2 hours      Discharge Planning:  Pt is progressing and alos needs encouragement due to her anxiety. She wants to be as good as she can be before going home. Neuro-psych saw and daughter and husband here to provide support    Team Discussion: Patient  was admitted post compression and multiple fractures of spine, osteoporotic fracture with chronic low back pain. Patient declined kyphoplasty. Conservative management with narcotics and TLSO. Patient limited by severe low back pain, anxiety, hypoxia on continuous oxygen  use, decreased endurance and pharyngeal dysphagia.   Patient on target to meet rehab goals: Currently patient requires set up assist with upper body care and requires CGA with reacher with lower body care. Patient requires mod-max assist with wheelchair transfers. Patient is able to ambulate short distances with CGA using rolling walker. Overall goals at discharge are set for supervision.  *See Care Plan and progress notes for long and short-term goals.   Revisions to Treatment Plan:  MBSS 02/19/24 Incentive Spirometer Reacher Neuro-psych consult Spine X-ray Air mattress TLSO brace Oxygen  machine Ortho tech consult Upgrade diet  Teaching Needs: Safety, transfers, toileting, medications, in and out catheterization, TLSO brace education, etc.   Current Barriers to Discharge: Decreased caregiver support and Home enviroment access/layout  Possible Resolutions to Barriers: Family Education Home Health follow-up DME: TTB     Medical Summary Current Status: Spinal fractures, back pain, UTI/urinary retention, ileus, ILD  Barriers to Discharge: Behavior/Mood;Electrolyte abnormality;Inadequate Nutritional Intake;Medical stability;Oxygen  Requirement;Self-care education;Uncontrolled Pain  Barriers to  Discharge Comments: Spinal fractures, back pain, UTI/urinary retention, ileus, ILD Possible Resolutions to [employer] Focus: Pain control, upgrade diet, consider increase flomax , ABX for UTI   Continued Need for Acute Rehabilitation Level of Care: The patient requires daily medical management by a physician with specialized training in physical medicine and rehabilitation for the following reasons: Direction of a multidisciplinary physical rehabilitation program to maximize functional independence : Yes Medical management of patient stability for increased activity during participation in an intensive rehabilitation regime.: Yes Analysis of laboratory values and/or radiology reports with any subsequent need for medication adjustment and/or medical intervention. : Yes   I attest that I was present, lead the team conference, and concur with the assessment and plan of the team.   Cordarryl Monrreal Gayo 02/18/2024, 1110 am

## 2024-02-19 ENCOUNTER — Inpatient Hospital Stay (HOSPITAL_COMMUNITY)

## 2024-02-19 DIAGNOSIS — N3000 Acute cystitis without hematuria: Secondary | ICD-10-CM

## 2024-02-19 MED ORDER — POLYETHYLENE GLYCOL 3350 17 GM/SCOOP PO POWD
119.0000 g | Freq: Once | ORAL | Status: DC
  Filled 2024-02-19: qty 119

## 2024-02-19 MED ORDER — GUAIFENESIN ER 600 MG PO TB12
600.0000 mg | ORAL_TABLET | Freq: Two times a day (BID) | ORAL | Status: AC
  Administered 2024-02-19 – 2024-02-22 (×6): 600 mg via ORAL
  Filled 2024-02-19 (×6): qty 1

## 2024-02-19 MED ORDER — ACETAMINOPHEN 325 MG PO TABS
650.0000 mg | ORAL_TABLET | Freq: Four times a day (QID) | ORAL | Status: AC | PRN
Start: 2024-02-19 — End: ?

## 2024-02-19 NOTE — Progress Notes (Signed)
 Pt would only open her mouth for meds in applesauce and then drink water  if nurse held it when giving hs meds. Pt encouraged to participate in her care. Nurse heard patient screaming  at 0230 , HELP! HELP ME! SOMEONE HELP! Nurse entered room and pt rolling back and forth on the bed. Nurse asked what was wrong pt stated, oh I thought I was at home. Nurse and tech assisted pt to bsc, after 15 min pt started to urinate on her own. PVR after. Pt assisted back to bed with tech, given water  and soda per request. At 0500 nurse gave medication to patient per orders. Pt asked to go to bathroom with tech lauryn at 0625am, pt requested to be cather because she , couldn't move pt then used call light to talk to nurse. Both nurse and tech encouraged pt to get up to bsc. Pt with assistance got up, waited 15 min on bsc, unable to urinate yelling, my bladder is going to burst.Pt yelling that she was in pain on bsc then asked for water , water  given. Nurse straight cathed pt for output. Educated pt what we should try to get up to bsc as apart of patient theray, pt upset by this. Call light within reach.

## 2024-02-19 NOTE — Plan of Care (Signed)
  Problem: RH Swallowing Goal: LTG Patient will consume least restrictive diet using compensatory strategies with assistance (SLP) Description: LTG:  Patient will consume least restrictive diet using compensatory strategies with assistance (SLP) Outcome: Completed/Met Goal: LTG Patient will participate in dysphagia therapy to increase swallow function with assistance (SLP) Description: LTG:  Patient will participate in dysphagia therapy to increase swallow function with assistance (SLP) Outcome: Completed/Met   

## 2024-02-19 NOTE — Progress Notes (Signed)
 Physical Therapy Session Note  Patient Details  Name: Kathleen [name] MRN: [id number] Date of Birth: 07-05-1961  Today's Date: 02/19/2024 PT Individual Time: 1345-1430 PT Individual Time Calculation (min): 45 min   Short Term Goals: Week 1:  PT Short Term Goal 1 (Week 1): Pt will complete rolling in bed with supervision PT Short Term Goal 1 - Progress (Week 1): Met PT Short Term Goal 2 (Week 1): Pt will complete supine<->sit with CGA. PT Short Term Goal 2 - Progress (Week 1): Met PT Short Term Goal 3 (Week 1): Pt will complete sit to stand with CGA + LRAD PT Short Term Goal 3 - Progress (Week 1): Progressing toward goal PT Short Term Goal 4 (Week 1): Pt will maintain dynamic standing balance with minA + LRAD PT Short Term Goal 4 - Progress (Week 1): Progressing toward goal PT Short Term Goal 5 (Week 1): Pt will ambulate x75' with LRAD CGA Week 2:  PT Short Term Goal 1 (Week 2): Pt will maintain static standing balance up to x3' with LRAD  Skilled Therapeutic Interventions/Progress Updates:    Pt on [facility] with RN when PT arrived.  Pt agreeable to PT.  Pt requesting to do ex's in room.  Pt having difficulty breathing, increased panic attacks during therapy.  Pt is able to transfer from [facility] and bed with supervision. Sit to stand ex's from bed to RW x3 with supervision.  Max tolerance for standing x30.  Cueing needed to breathe throughout.  Pt started to experience panic attack during session, requesting removal of brace. Cueing needed to relax.  Supine heel raises, KTC/hip abd  with yellow band, ankle pumps; x15-20 each.  Pt left in bed with all items within reach.   Therapy Documentation Precautions:  Precautions Precautions: Fall Recall of Precautions/Restrictions: Intact Precaution/Restrictions Comments: TLSO brace (when ambulating), pt refused to don at evaluation Required Braces or Orthoses: Spinal Brace Spinal Brace: Thoracolumbosacral orthotic Restrictions Weight Bearing  Restrictions Per Provider Order: No  Pain: pt c/o pain to back      Therapy/Group: Individual Therapy  [name] Fast 02/19/2024, 3:36 PM

## 2024-02-19 NOTE — Plan of Care (Signed)
  Problem: Consults Goal: RH SPINAL CORD INJURY PATIENT EDUCATION Description:  See Patient Education module for education specifics.  Outcome: Progressing   Problem: SCI BOWEL ELIMINATION Goal: RH STG MANAGE BOWEL WITH ASSISTANCE Description: STG Manage Bowel with toileting Assistance. Outcome: Progressing Goal: RH STG SCI MANAGE BOWEL WITH MEDICATION WITH ASSISTANCE Description: STG SCI Manage bowel with medication with mod I assistance. Outcome: Progressing Goal: RH STG SCI MANAGE BOWEL PROGRAM W/ASSIST OR AS APPROPRIATE Description: STG SCI Manage bowel program w/assist or as appropriate. Outcome: Progressing   Problem: SCI BLADDER ELIMINATION Goal: RH STG MANAGE BLADDER WITH ASSISTANCE Description: STG Manage Bladder With Assistance Outcome: Progressing Goal: RH STG MANAGE BLADDER WITH MEDICATION WITH ASSISTANCE Description: STG Manage Bladder With Medication With mod I Assistance. Outcome: Progressing Goal: RH STG SCI MANAGE BLADDER PROGRAM W/ASSISTANCE Description: Toileting protocol Outcome: Progressing   Problem: RH KNOWLEDGE DEFICIT SCI Goal: RH STG INCREASE KNOWLEDGE OF SELF CARE AFTER SCI Description: Patient and spouse will be able to manage care at discharge using educational resources for medication, skin care, transfers, toileting, dietary modification independently Outcome: Progressing   Problem: RH SAFETY Goal: RH STG ADHERE TO SAFETY PRECAUTIONS W/ASSISTANCE/DEVICE Description: STG Adhere to Safety Precautions With cues Assistance/Device. Outcome: Progressing

## 2024-02-19 NOTE — Progress Notes (Signed)
 PROGRESS NOTE   Subjective/Complaints: Pt reports poor appetite/nausea. Documented she at 50-85 % meals yesterday. No BM since 6/22. Reports occasional shortness of breath. Continued anxiety.    Vital stable    02/19/2024    4:28 AM 02/18/2024    8:00 PM 02/18/2024    5:04 PM  Vitals with BMI  Systolic 115 127 889  Diastolic 57 67 80  Pulse 88 86 88    ROS: Patient denies fever, rash, vomiting, diarrhea, shortness of breath or chest pain,headache  + back pain-continued  + anxiety- continued +Chronic urinary retention + constipation-improved + Abdominal pain/nausea   Objective:   DG Swallowing Func-Speech Pathology Result Date: 02/19/2024 Table formatting from the original result was not included. Modified Barium Swallow Study Patient Details Name: NAJAT OLAZABAL MRN: [id number] Date of Birth: [birth year] Today's Date: 02/19/2024 HPI/PMH: HPI: Pt is a 63 year old right handed female with history of chronic hypoxic respiratory failure (5 L O2) interstitial lung disease followed by pulmonary services Dr. Selinda Moots at [facility], sarcoidosis maintained on chronic prednisone , Addison's disease maintained on Cortef  followed by endocrinology services Dr.Gebreselassie Nida, chronic hypotension, chronic anemia, chronic urinary incontinence, class III obesity BMI 39.75, bilateral hip AVN and not felt to be a surgical candidate, osteoporosis with multiple vertebral fractures on chronic narcotics followed at the [facility] pain clinic and recent admissions for intractable back pain due to compression fractures of L4 (4/12 - 4/16) and then L3 (5/9 - 5/14), readmitted 01/20/2024 with acute L1 fracture discharged to home 01/23/2024 with [place] home health services. Per chart review patient lives with spouse. 1 level home one-step to entry. Patient readmitted 01/25/2024 with increasing low back pain to [facility]. CT lumbar spine showed superior endplate  fracture of L1 appearing stable with approximately 10 to 15% loss of height no retropulsion. Stable superior endplate fractures of L3 and L4 and also 25 to 30% loss of height and minimal retropulsion of the posterior superior aspect of the vertebral bodies without significant associated canal stenosis. CT of the pelvis showed acute left pubic symphyseal and right sacral ala compression fractures/L4 superior endplate fracture as well as avascular necrosis of the left femoral head with mild articular collapse. Admission chemistries unremarkable except chloride 94 CO2 30 glucose 145 alkaline phosphatase 129, hemoglobin 9.1, urine culture greater than 100,000 E. coli. Patient was transferred to  [facility] due to increasing back pain. Interventional radiology consulted/Dr. Dolphus and discussed at length kyphoplasty which patient declined. TLSO back brace as directed. Hospital course patient did receive a 3-day course of intravenous ceftriaxone  for E. coli UTI 6/1 through 6/3. She was cleared to begin Lovenox  for DVT prophylaxis. Followed the distance by critical care medicine for pulmonary sarcoidosis/chronic hypoxic respiratory failure and currently on 4 L of oxygen  nasal cannula. Clinical Impression: Clinical Impression: Similar to MBSS completed 06/2023, pt presents w/ an overall functional oropharyngeal swallow. View was limited d/t pt positioning and body habitus, however, no airway invasion noted across trials. Of note, pt coughed immediately following pill trial (w/ thin liquids), though no penetration/aspiration was evident. Anticipate coughing was d/t hypersensation during upper esophageal clearance and/or anxiety. Pt was very anxious throughout the study and  required frequent encouragement to continue. Unable to complete esophageal sweep d/t body habitus, however, pt would benefit from further esophageal assessment to r/o any complications that could be negatively impacting her overall swallow  function. Skilled ST/oropharyngeal dysphagia intervention not warranted at this time. Factors that may increase risk of adverse event in presence of aspiration [name] & [name] 2021): Factors that may increase risk of adverse event in presence of aspiration [name] & [name] 2021): Frail or deconditioned Recommendations/Plan: Swallowing Evaluation Recommendations Swallowing Evaluation Recommendations Recommendations: PO diet PO Diet Recommendation: Regular; Thin liquids (Level 0) Liquid Administration via: Straw Medication Administration: Whole meds with liquid Supervision: Patient able to self-feed Swallowing strategies  : Slow rate; Small bites/sips Postural changes: Stay upright 30-60 min after meals; Position pt fully upright for meals Oral care recommendations: Oral care BID (2x/day) Recommended consults: Consider GI consultation; Consider esophageal assessment Treatment Plan Treatment Plan Treatment recommendations: No treatment recommended at this time Follow-up recommendations: No SLP follow up Functional status assessment: Patient has not had a recent decline in their functional status. Recommendations Recommendations for follow up therapy are one component of a multi-disciplinary discharge planning process, led by the attending physician.  Recommendations may be updated based on patient status, additional functional criteria and insurance authorization. Assessment: Orofacial Exam: Orofacial Exam Oral Cavity: Oral Hygiene: WFL Oral Cavity - Dentition: Missing dentition; Adequate natural dentition Orofacial Anatomy: WFL Oral Motor/Sensory Function: WFL Anatomy: Anatomy: WFL Boluses Administered: Boluses Administered Boluses Administered: Thin liquids (Level 0); Mildly thick liquids (Level 2, nectar thick); Puree; Solid  Oral Impairment Domain: Oral Impairment Domain Lip Closure: No labial escape Tongue control during bolus hold: Cohesive bolus between tongue to palatal seal Bolus preparation/mastication:  Timely and efficient chewing and mashing Bolus transport/lingual motion: Brisk tongue motion Oral residue: Complete oral clearance Location of oral residue : N/A Initiation of pharyngeal swallow : Posterior laryngeal surface of the epiglottis  Pharyngeal Impairment Domain: Pharyngeal Impairment Domain Soft palate elevation: No bolus between soft palate (SP)/pharyngeal wall (PW) Laryngeal elevation: Complete superior movement of thyroid  cartilage with complete approximation of arytenoids to epiglottic petiole Anterior hyoid excursion: Complete anterior movement Epiglottic movement: Complete inversion Laryngeal vestibule closure: Complete, no air/contrast in laryngeal vestibule Pharyngeal stripping wave : Present - complete Pharyngeal contraction (A/P view only): N/A Pharyngoesophageal segment opening: Complete distension and complete duration, no obstruction of flow Tongue base retraction: No contrast between tongue base and posterior pharyngeal wall (PPW) Pharyngeal residue: Collection of residue within or on pharyngeal structures Location of pharyngeal residue: Tongue base; Valleculae; Pyriform sinuses  Esophageal Impairment Domain: No data recorded Pill: Pill Consistency administered: Thin liquids (Level 0) Thin liquids (Level 0): [facility] Penetration/Aspiration Scale Score: Penetration/Aspiration Scale Score 1.  Material does not enter airway: Thin liquids (Level 0); Mildly thick liquids (Level 2, nectar thick); Puree; Solid; Pill Compensatory Strategies: Compensatory Strategies Compensatory strategies: No   General Information: Caregiver present: No  Diet Prior to this Study: Thin liquids (Level 0); Other (Comment) (soft)   Temperature : Normal   Respiratory Status: WFL   Supplemental O2: Nasal cannula (4L O2 via nasal cannula)   No data recorded Behavior/Cognition: Alert; Requires cueing Self-Feeding Abilities: Able to self-feed Baseline vocal quality/speech: Dysphonic Volitional Cough: Able to elicit Volitional  Swallow: Able to elicit Exam Limitations: Limited visibility Goal Planning: No data recorded No data recorded No data recorded Patient/Family Stated Goal: to eat without choking Consulted and agree with results and recommendations: Patient Pain: Pain Assessment Faces Pain Scale: 8 Pain Location: back Pain  Descriptors / Indicators: Grimacing; Crying Pain Intervention(s): Repositioned; Relaxation End of Session: Start Time:No data recorded Stop Time: No data recorded Time Calculation:No data recorded Charges: No data recorded SLP visit diagnosis: SLP Visit Diagnosis: Other (comment) (anticipate esophageal dysphagia) Past Medical History: Past Medical History: Diagnosis Date  Adrenal insufficiency (HCC)   Anemia   Anxiety   Avascular bone necrosis (HCC)   Breast fibrocystic disorder   Chronic pain   Encounter for long-term opiate analgesic use 02/02/2020  Patient has chronic pain with femoral head avascular necrosis.  Is under pain contract  Fibromyalgia   Gastroesophageal reflux disease   Hypothyroidism   Mitral valve prolapse   Orthostatic hypotension   Peripheral neuropathy   PONV (postoperative nausea and vomiting)   Restrictive lung disease   Moderate to severe  Sarcoidosis   Biopsy proven - [facility]  Sarcoidosis   SOB (shortness of breath)   chronic Past Surgical History: Past Surgical History: Procedure Laterality Date  BIOPSY  10/17/2018  Procedure: BIOPSY;  Surgeon: Golda Claudis PENNER, MD;  [facility]: [facility];  Service: Endoscopy;;  duodenum, antrum, gastric body  BIOPSY  03/03/2021  Procedure: BIOPSY;  Surgeon: Golda Claudis PENNER, MD;  [facility]: [facility];  Service: Endoscopy;;  BIOPSY  09/11/2023  Procedure: BIOPSY;  Surgeon: Shaaron Lamar HERO, MD;  [facility]: [facility];  Service: Endoscopy;;  BREAST LUMPECTOMY Bilateral 01/12/2011  CHOLECYSTECTOMY N/A 04/17/2021  Procedure: LAPAROSCOPIC CHOLECYSTECTOMY;  Surgeon: Mavis Anes, MD;  Location: [facility];  Service: General;  Laterality: N/A;  COLONOSCOPY WITH  PROPOFOL  N/A 01/03/2022  Procedure: COLONOSCOPY WITH PROPOFOL ;  Surgeon: Golda Claudis PENNER, MD;  [facility]: [facility];  Service: Endoscopy;  Laterality: N/A;  220  ESOPHAGEAL DILATION N/A 03/03/2021  Procedure: ESOPHAGEAL DILATION;  Surgeon: Golda Claudis PENNER, MD;  [facility];  Service: Endoscopy;  Laterality: N/A;  ESOPHAGOGASTRODUODENOSCOPY (EGD) WITH PROPOFOL  N/A 10/17/2018  Procedure: ESOPHAGOGASTRODUODENOSCOPY (EGD) WITH PROPOFOL ;  Surgeon: Golda Claudis PENNER, MD;  [facility];  Service: Endoscopy;  Laterality: N/A;  2:25  ESOPHAGOGASTRODUODENOSCOPY (EGD) WITH PROPOFOL  N/A 03/03/2021  Procedure: ESOPHAGOGASTRODUODENOSCOPY (EGD) WITH PROPOFOL ;  Surgeon: Golda Claudis PENNER, MD;  [facility];  Service: Endoscopy;  Laterality: N/A;  12:15  ESOPHAGOGASTRODUODENOSCOPY (EGD) WITH PROPOFOL  N/A 09/11/2023  Procedure: ESOPHAGOGASTRODUODENOSCOPY (EGD) WITH PROPOFOL ;  Surgeon: Shaaron Lamar HERO, MD;  [facility];  Service: Endoscopy;  Laterality: N/A;  2:30 pm, asa 3, LM to see if pt can come earlier  [facility] DILATION N/A 09/11/2023  Procedure: AGAPITO DILATION;  Surgeon: Shaaron Lamar HERO, MD;  [facility]: [facility];  Service: Endoscopy;  Laterality: N/A;  ORIF ANKLE FRACTURE Left 03/10/2023  Procedure: OPEN REDUCTION INTERNAL FIXATION (ORIF) ANKLE FRACTURE;  Surgeon: Georgina Ozell LABOR, MD;  Location: [facility];  Service: Orthopedics;  Laterality: Left;  POLYPECTOMY  01/03/2022  Procedure: POLYPECTOMY INTESTINAL;  Surgeon: Golda Claudis PENNER, MD;  [facility];  Service: Endoscopy;;  TUBAL LIGATION   [name] LABOR Mole 02/19/2024, 11:15 AM   Recent Labs    02/17/24 0325  WBC 7.2  HGB 8.5*  HCT 28.8*  PLT 407*    Recent Labs    02/17/24 0325  NA 139  K 3.5  CL 100  CO2 35*  GLUCOSE 99  BUN <5*  CREATININE 1.00  CALCIUM  8.5*     Intake/Output Summary (Last 24 hours) at 02/19/2024 1317 Last data filed at 02/19/2024 0700 Gross per 24 hour  Intake 476 ml   Output 400  ml  Net 76 ml        Physical Exam: Vital Signs Blood pressure (!) 115/57, pulse 88, temperature 98.4 F (36.9 C), resp. rate 16, height 5' 10 (1.778 m), weight 124.9 kg, last menstrual period 05/17/2016, SpO2 93%.  Constitutional: No apparent distress. Appropriate appearance for age.  Obese.  Laying in bed, appears comfortable HENT: No JVD. Neck Supple. Trachea midline. Atraumatic, normocephalic. Eyes: PERRLA. EOMI. Visual fields grossly intact.  Cardiovascular: RRR, no murmurs/rub/gallops.  2+ right lower extremity, 1+ left lower extremity edema. Peripheral pulses 2+  Respiratory: CTAB. No rales, rhonchi, or wheezing. On 4 L nasal cannula. Abdomen: + bowel sounds, normoactive,  Distention, mild diffuse TTP, without guarding or palpable deformity.   Skin: C/D/I. No apparent lesions. MSK:      No apparent deformity.       Neurologic exam:  Awake, alert, oriented x 4.  No apparent deficits.  Cranial nerve exam 2 through 12 intact.  Intact to light touch throughout. Ue's5-/5 in Deltoids, biceps, triceps; WE 4/5- give way weakness; Grip 4+/5 and FA 4+/5 B/L LE's- 5-/5 throughout B/L   Prior neuro assessment is c/w today's exam 02/19/2024.      Assessment/Plan: 1. Functional deficits which require 3+ hours per day of interdisciplinary therapy in a comprehensive inpatient rehab setting. Physiatrist is providing close team supervision and 24 hour management of active medical problems listed below. Physiatrist and rehab team continue to assess barriers to discharge/monitor patient progress toward functional and medical goals  Care Tool:  Bathing    Body parts bathed by patient: Left arm, Right arm, Abdomen, Chest, Face   Body parts bathed by helper: Front perineal area, Buttocks, Right upper leg, Left upper leg, Right lower leg, Left lower leg     Bathing assist Assist Level: Minimal Assistance - Patient > 75%     Upper Body Dressing/Undressing Upper body  dressing   What is the patient wearing?: Orthosis, Pull over shirt Orthosis activity level: Performed by helper  Upper body assist Assist Level: Supervision/Verbal cueing    Lower Body Dressing/Undressing Lower body dressing      What is the patient wearing?: Incontinence brief, Pants     Lower body assist Assist for lower body dressing: Minimal Assistance - Patient > 75%     Toileting Toileting    Toileting assist Assist for toileting: Minimal Assistance - Patient > 75%     Transfers Chair/bed transfer  Transfers assist     Chair/bed transfer assist level: Moderate Assistance - Patient 50 - 74% (low surface)     Locomotion Ambulation   Ambulation assist      Assist level: Contact Guard/Touching assist Assistive device: Walker-rolling Max distance: x88'   Walk 10 feet activity   Assist  Walk 10 feet activity did not occur: Safety/medical concerns  Assist level: Contact Guard/Touching assist Assistive device: Walker-rolling   Walk 50 feet activity   Assist Walk 50 feet with 2 turns activity did not occur: Safety/medical concerns  Assist level: Contact Guard/Touching assist Assistive device: Walker-rolling    Walk 150 feet activity   Assist Walk 150 feet activity did not occur: Safety/medical concerns (pt too fatigued, SOB)         Walk 10 feet on uneven surface  activity   Assist Walk 10 feet on uneven surfaces activity did not occur: Safety/medical concerns         Wheelchair     Assist Is the patient using a wheelchair?: Yes Type of Wheelchair:  Manual Wheelchair activity did not occur: Safety/medical concerns  Wheelchair assist level: Supervision/Verbal cueing Max wheelchair distance: x8'    Wheelchair 50 feet with 2 turns activity    Assist    Wheelchair 50 feet with 2 turns activity did not occur: Safety/medical concerns   Assist Level: Dependent - Patient 0%   Wheelchair 150 feet activity     Assist   Wheelchair 150 feet activity did not occur: Safety/medical concerns   Assist Level: Dependent - Patient 0%   Blood pressure (!) 115/57, pulse 88, temperature 98.4 F (36.9 C), resp. rate 16, height 5' 10 (1.778 m), weight 124.9 kg, last menstrual period 05/17/2016, SpO2 93%.  Medical Problem List and Plan: 1. Functional deficits secondary to compression and multiple fractures of spine without SCI-intractable acute on chronic low back pain due to osteoporotic fracture involving L1, L3 and L4/acute left pubic symphysis and right sacral ala fracture.  Patient declined kyphoplasty.  Conservative management with narcotics and TLSO             -patient may  shower             -ELOS/Goals: 12-14 days- supervision to min A             -Continue CIR therapies including PT, OT   - L-spine x-ray worsening compression deformity L1 level  -Exp dc 7/1  -Code status changed to full code after discussion with patient- she requested this be changed   2.  Antithrombotics: -DVT/anticoagulation:  Pharmaceutical: Lovenox .  Check vascular study 6/13- Dopplers (-)             -antiplatelet therapy: N/A 3. Pain Management/chronic pain followed at [facility] pain clinic- usually takes 10 mg 6x/day: Flexeril  5 mg 3 times daily, lidoderm  patch, oxycodone  30 mg every 4 hours as needed upon transfer- attempting, with d/w pt and husband, to add MS Contin  15 mg BID and reduce Oxycodone  to 20 mg q4 hours  6/13- changed to Oxycontin  10 mg BID and continue Oxy 20 mg q4 hours prn  6/16 increase oxycontin  to 15mg  Q12h  6/17 Increase OxyContin  to 20 mg every 12 hours 4. Mood/Behavior/Sleep:  Pt has severe anxiety Cymbalta  60 mg daily, BuSpar  15 mg 3 times daily, Atarax  25 mg 3 times daily as needed             -added Celexa  10 mg daily for anxiety- is better for anxiety than Duloxetine , but need Duloxetine  for pain control  - 6/17 seen by neuropsychology for anxiety with depression, appreciate assistance              -antipsychotic agents: N/A  -6/25 consult psychiatry for severe anxiety 5. Neuropsych/cognition: This patient is ?capable of making decisions on her own behalf. 6. Skin/Wound Care: Routine skin checks 7. Fluids/Electrolytes/Nutrition: Routine in and outs with follow-up chemistries  8.  Sarcoidosis/interstitial lung disease/chronic respiratory failure with hypoxia- Was on 4-5L at home- been doing 3-4L here- and concern for hypercarbia.  Continue chronic oxygen  therapy.  Prednisone  as directed, Lasix  20 mg Monday Wednesday Fridays.  Followed by Dr. Selinda Moots pulmonary services [facility]  6/14- some sob this morning but VS ok. ?anxiety component  -she is receiving brovana  and pulmicort  scheduled with albuterol  prn  -added IS/ and FV today  6/15 breathing better today. I asked her if she took prednisone  with hydrocortisone , and she told me she did at one point, but no longer.    -obsv for now  breathing doing ok- at basline,  continue to monitor  Discussed Incentive spirometer SpO2: 93 % O2 Flow Rate (L/min): 4.5 L/min FiO2 (%): 36 % 6/22-4: Remains on 4 L nasal cannula, satting well. 6/25 chest CXR, order mucinex  for 3 days  9.  Left femoral head avascular necrosis.Probably has bilaterally, based on chart review  Not a surgical candidate.  Conservative care  6/13- went over with therapy- that her goals from my point of view is to walk 15-25 ft, but probably not more- will not make AVN worse, but will hurt more-   10.  E. coli UTI as well as chronic urinary incontinence.  Status post 3-day course of IV Rocephin  completed.  Continue Flomax  0.4 mg daily.  Check PVR  6/14-15 pt is voiding continently. No PVR's but I'm not sure they'reneeded right now  6/16 check PVRs, pt reports she doesn't empty completely - do not see any completed yet 6/19 recheck U/A and culture for dysuria, ask nursing completed PVRs  -Start keflex - monitor cultures- pseudomonas- sensitivities pending- Change to  Levofloxacin  6/21: cx + pseudomonas; given n.p.o. will switch to IV cefepime  2 g q8h 6/22: Continence improving, continue IV cefepime  for now.  Discussed risks of Foley placement for incontinence given patient discomfort with transferring to bedside commode, she is agreeable to continuing this intermittent straight cathing as needed; husband has been trained on straight cathing in the past and is comfortable doing this at discharge if needed 6/23 continues with intermittent catheterizations, continue current regimen and monitor 6/24 increase flomax  to 0.8mg  6/25 antibiotic discontinued after discussion with pharmacy, has completed 7 days    11.  Normocytic anemia.  Borderline MCV of 96 and RDW of 16.  B12 and folate within normal limits.  Check CBC  6/14- CBC HGB stable at 8.3  6/17  HGB stable 8.5 12.  Addison's disease.  Continue hydrocortisone  chronically.  Followed by endocrinology services Dr.Gebreselassie Nida 13.  Chronic hypotension.  ProAmatine  5 mg twice daily    02/19/2024    4:28 AM 02/18/2024    8:00 PM 02/18/2024    5:04 PM  Vitals with BMI  Systolic 115 127 889  Diastolic 57 67 80  Pulse 88 86 88  Controlled continue current regimen  14.  Class III morbid obesity.  BMI 39.75.  Dietary follow-up 15.  Hyperlipidemia.  Crestor  16.  Hypothyroidism.  Synthroid  17.  Chronic constipation/ileus.  Linzess  72 mcg daily, Senokot S2 tablets bedtime, Chronulac  as needed  6/13- LBM 2 days ago- she says doesn't go every day  6/15 I suspect her nausea is related to this. We also started fosamax  yesterday. Discussed how oxycodone  can cause nausea and constipation   -  KUB today reveals moderate stool burden, no ileus   -will order fleets enema   -rx nausea, increase ppi to bid   -increase senna to bid  6/16 reports good results with enema yesterday, continue to monitor   6/17 LBM 6/15 consider additional medication if no BM by then  6/18 Patient reports BM yesterday, continue to  monitor   6/20 Sorbitol  ordered this AM- no results will order xray abdomen and Enema- discussed with nursing- had BM with this, add miralax   Small, type I bowel movement 6/20  6/21: KUB with likely ileus--diffuse distention and abdominal pain with slow bowel sounds-- will consult surgery per addendum yesterday.  Started on maintenance IV fluids  6/22: Surgery evaluated yesterday, deemed nonop management, diet as tolerated.  Tolerated liquid diet overnight, will advance to soft today.  DC  IV fluids.  Start Reglan  5 mg 3 times daily.  Getting soapsuds enema tonight given no BM for 2 days.  -- Given she is passing flatus, if adequate bowel movement tonight and good control of symptoms in AM, would consider advancement to full diet.  Will get KUB for a.m.  6/23 continue soft diet today, KUB nonobstructive today, mild stool burden.  Will hold off on increase of pain medications although she does have back pain as do not want to worsen this  6/24 upgrade diet to regular, consider DC reglan , ate 85% meal today, increase miralax  to TID  6/25 recheck abdomina xray assess stool burdon/nausea. Will do bowel prep 1/2 container for constipation  18.  GERD.  Protonix  19. Noncompliance- Per nursing, pt refusing to use TLSO for [facility] transfers, of note- will d/w pt that need to follow recs, or there's more risk of additional fractures.  20. Hypokalemia  6/13- will replete 40 mEq and then recheck Monday  6/23 low normal potassium at 3.5, will add 20 mEq x 1  21. Azotemia  -6/18 Cr 1.04 encourage oral fluids-order placed  -6/23 --stable creatinine 1.0, encourage oral intake  Recheck tomorrow   22. Dysphagia  -SLP consult completed  LOS: 13 days A FACE TO FACE EVALUATION WAS PERFORMED  Murray Collier 02/19/2024, 1:17 PM

## 2024-02-19 NOTE — Procedures (Signed)
 Modified Barium Swallow Study  Patient Details  Name: Kathleen [name] MRN: [id number] Date of Birth: 1961/05/17  Today's Date: 02/19/2024  Modified Barium Swallow completed.  Full report located under Chart Review in the Imaging Section.  History of Present Illness Pt is a 63 year old right handed female with history of chronic hypoxic respiratory failure (5 L O2) interstitial lung disease followed by pulmonary services Dr. Selinda [name] at [facility], sarcoidosis maintained on chronic prednisone , Addison's disease maintained on Cortef  followed by endocrinology services Dr.Gebreselassie [name], chronic hypotension, chronic anemia, chronic urinary incontinence, class III obesity BMI 39.75, bilateral hip AVN and not felt to be a surgical candidate, osteoporosis with multiple vertebral fractures on chronic narcotics followed at the [facility] pain clinic and recent admissions for intractable back pain due to compression fractures of L4 (4/12 - 4/16) and then L3 (5/9 - 5/14), readmitted 01/20/2024 with acute L1 fracture discharged to home 01/23/2024 with [place] home [facility] services. Per chart review patient lives with spouse. 1 level home one-step to entry. Patient readmitted 01/25/2024 with increasing low back pain to [facility]. CT lumbar spine showed superior endplate fracture of L1 appearing stable with approximately 10 to 15% loss of height no retropulsion. Stable superior endplate fractures of L3 and L4 and also 25 to 30% loss of height and minimal retropulsion of the posterior superior aspect of the vertebral bodies without significant associated canal stenosis. CT of the pelvis showed acute left pubic symphyseal and right sacral ala compression fractures/L4 superior endplate fracture as well as avascular necrosis of the left femoral head with mild articular collapse. Admission chemistries unremarkable except chloride 94 CO2 30 glucose 145 alkaline phosphatase 129, hemoglobin 9.1, urine culture greater than  100,000 E. coli. Patient was transferred to [facility] due to increasing back pain. Interventional radiology consulted/Dr. Dolphus and discussed at length kyphoplasty which patient declined. TLSO back brace as directed. Hospital course patient did receive a 3-day course of intravenous ceftriaxone  for E. coli UTI 6/1 through 6/3. She was cleared to begin Lovenox  for DVT prophylaxis. Followed the distance by critical care medicine for pulmonary sarcoidosis/chronic hypoxic respiratory failure and currently on 4 L of oxygen  nasal cannula.   Clinical Impression Similar to MBSS completed 06/2023, pt presents w/ an overall functional oropharyngeal swallow. View was limited d/t pt positioning and body habitus, however, no airway invasion noted across trials. Of note, pt coughed immediately following pill trial (w/ thin liquids), though no penetration/aspiration was evident. Anticipate coughing was d/t hypersensation during upper esophageal clearance and/or anxiety. Pt was very anxious throughout the study and required frequent encouragement to continue. Unable to complete esophageal sweep d/t body habitus, however, pt would benefit from further esophageal assessment to r/o any complications that could be negatively impacting her overall swallow function. Skilled ST/oropharyngeal dysphagia intervention not warranted at this time.   Factors that may increase risk of adverse event in presence of aspiration Kathleen [name] & Kathleen [name] 2021): Frail or deconditioned  Swallow Evaluation Recommendations Recommendations: PO diet PO Diet Recommendation: Regular;Thin liquids (Level 0) Liquid Administration via: Straw Medication Administration: Whole meds with liquid Supervision: Patient able to self-feed Swallowing strategies  : Slow rate;Small bites/sips Postural changes: Stay upright 30-60 min after meals;Position pt fully upright for meals Oral care recommendations: Oral care BID (2x/day) Recommended consults:  Consider GI consultation;Consider esophageal assessment     Kathleen [name] Kathleen [name] Kathleen [name] 02/19/2024,10:18 AM

## 2024-02-19 NOTE — Progress Notes (Signed)
 Speech Language Pathology Discharge Summary  Patient Details  Name: Kathleen [name] MRN: [id number] Date of Birth: 1961/07/19  Date of Discharge from SLP service:February 19, 2024   Patient has met 2 of 2 long term goals.  Patient to discharge at overall Modified Independent level.  Reasons goals not met: n/a   Clinical Impression/Discharge Summary: Pt met 2/2 LTGs this admission, as evidenced by adequte oropharyngeal swallow function and diet tolerance. MBSS completed 02/19/24 and revealed no oropharyngeal dysphagia. Anticipate esophageal dysphagia and significant anxiety negatively impact her overall success, however, no concerns for airway invasion at this time. Pt education re general aspiration and reflux precautions complete. No additional ST services required.   Care Partner:  Caregiver Able to Provide Assistance: Yes  Type of Caregiver Assistance: Physical  Recommendation:  Other (comment) (GI consult)     Equipment: n/a   Reasons for discharge: Treatment goals met   Patient/Family Agrees with Progress Made and Goals Achieved: Yes    Kathleen [name] 02/19/2024, 3:52 PM

## 2024-02-19 NOTE — Progress Notes (Addendum)
 Ok to add stop date cefepime  for 6/25 to complete 7d of therapy per Dr. Urbano.   Sergio Batch, PharmD, BCIDP, AAHIVP, CPP Infectious Disease Pharmacist 02/19/2024 1:20 PM

## 2024-02-19 NOTE — Progress Notes (Signed)
 Physical Therapy Session Note  Patient Details  Name: Kathleen [name] MRN: [id number] Date of Birth: 1960/10/03  Today's Date: 02/19/2024 PT Individual Time: 1100-1200 PT Individual Time Calculation (min): 60 min   Short Term Goals: Week 1:  PT Short Term Goal 1 (Week 1): Pt will complete rolling in bed with supervision PT Short Term Goal 1 - Progress (Week 1): Met PT Short Term Goal 2 (Week 1): Pt will complete supine<->sit with CGA. PT Short Term Goal 2 - Progress (Week 1): Met PT Short Term Goal 3 (Week 1): Pt will complete sit to stand with CGA + LRAD PT Short Term Goal 3 - Progress (Week 1): Progressing toward goal PT Short Term Goal 4 (Week 1): Pt will maintain dynamic standing balance with minA + LRAD PT Short Term Goal 4 - Progress (Week 1): Progressing toward goal PT Short Term Goal 5 (Week 1): Pt will ambulate x75' with LRAD CGA Week 2:  PT Short Term Goal 1 (Week 2): Pt will maintain static standing balance up to x3' with LRAD  Skilled Therapeutic Interventions/Progress Updates:    Pt in bed when PT arrived. Pt agreeable to PT.  Pt on IV antibiotics. Pt states she has to use BSC.  Pt able to transfer self bed<->BSC with supervision using RW. Pt able to void. BSC<->W/C supervision. Pt wheeled to day gym  x2 with IV, oxygen , RW.  Session focusing on standing tolerance, mobility endurance.  Sit<->stand from w/c to RW x2 mod assist.  Pt able to stand x2 trials; x15, 27.  Pt willing to ambulate with RW CGA x12' max distance today.  Pt wheeled back to room with family present. Pt stayed in w/c with family.    Therapy Documentation Precautions:  Precautions Precautions: Fall Recall of Precautions/Restrictions: Intact Precaution/Restrictions Comments: TLSO brace (when ambulating), pt refused to don at evaluation Required Braces or Orthoses: Spinal Brace Spinal Brace: Thoracolumbosacral orthotic Restrictions Weight Bearing Restrictions Per Provider Order: No   Pain: 10/10       Therapy/Group: Individual Therapy  [name] Fast 02/19/2024, 12:53 PM

## 2024-02-19 NOTE — Progress Notes (Signed)
 Occupational Therapy Session Note  Patient Details  Name: Kathleen [name] MRN: [id number] Date of Birth: 1961-04-01  Today's Date: 02/19/2024 OT Individual Time: 9199-9154 & 1445-1530 OT Individual Time Calculation (min): 45 min & 45 min   Short Term Goals: Week 2:  OT Short Term Goal 1 (Week 2): STG=LTG d/t ELOS  Skilled Therapeutic Interventions/Progress Updates:      Therapy Documentation Precautions:  Precautions Precautions: Fall Recall of Precautions/Restrictions: Intact Precaution/Restrictions Comments: TLSO brace (when ambulating), pt refused to don at evaluation Required Braces or Orthoses: Spinal Brace Spinal Brace: Thoracolumbosacral orthotic Restrictions Weight Bearing Restrictions Per Provider Order: No Session 1 General: Pt supine in bed upon OT arrival, agreeable to OT session.  Pain:  10/10 pain reported in back, activity, intermittent rest breaks, distractions provided for pain management, pt reports tolerable to proceed.   ADL: OT providing skilled intervention on ADL retraining in order to increase independence with tasks and increase activity tolerance. Pt completed the following tasks at the current level of assist: Bed mobility: SBA using bed rails and HOB raised Transfers: Min A +2 d/t increased pain and anxiety with RW  Other Treatments:  OT providing education and modalities for pain management such as heat pack. OT providing heat pack when in chair and after session in bed for decreased pain in order to increase independence with ADLs. OT discussed anxiety as a limiting factor for pt d/t limiting progress in ADLs and mobility.    Pt supine in bed with bed alarm activated, 2 bed rails up, call light within reach and 4Ps assessed.    Session 2 General: Pt supine in bed upon OT arrival, agreeable to OT session.  Pain: 10/10 pain reported in back, activity, intermittent rest breaks, distractions provided for pain management, pt reports tolerable to  proceed.  ADL: OT providing skilled intervention on ADL retraining in order to increase independence with tasks and increase activity tolerance. Pt completed the following tasks at the current level of assist: Bed mobility: Min A with assistance for trunk with HOB raised d/t pain Toilet transfer: CGA with RW and VC to slow pace d/t increased anxiety during transfers d/t fear of falling Toileting: Min A, assistance with pants down, pt able to complete front hygiene and pants up with VC for slow pace to set balance and keep one arm and RW for support   Exercises: Pt completed the following exercise circuit in order to improve functional activity, strength and endurance to prepare for ADLs such as bathing. Pt completed the following exercises in supine position with no noted LOB/SOB and 3x10 repetitions on each exercise: -bicep curls -triceps extensions -shoulder abduction -shoulder flexion -resistive hip extension   OT providing rest breaks throughout exercises for pain management and muscle recovery.    Pt supine in bed with bed alarm activated, 2 bed rails up, call light within reach and 4Ps assessed.   Therapy/Group: Individual Therapy  Camie Hoe, OTD, OTR/L 02/19/2024, 3:53 PM

## 2024-02-20 DIAGNOSIS — D62 Acute posthemorrhagic anemia: Secondary | ICD-10-CM

## 2024-02-20 LAB — BASIC METABOLIC PANEL WITH GFR
Anion gap: 3 — ABNORMAL LOW (ref 5–15)
BUN: 6 mg/dL — ABNORMAL LOW (ref 8–23)
CO2: 36 mmol/L — ABNORMAL HIGH (ref 22–32)
Calcium: 8.2 mg/dL — ABNORMAL LOW (ref 8.9–10.3)
Chloride: 100 mmol/L (ref 98–111)
Creatinine, Ser: 0.86 mg/dL (ref 0.44–1.00)
GFR, Estimated: >60 mL/min (ref >60)
Glucose, Bld: 113 mg/dL — ABNORMAL HIGH (ref 70–99)
Potassium: 3.2 mmol/L — ABNORMAL LOW (ref 3.5–5.1)
Sodium: 139 mmol/L (ref 135–145)

## 2024-02-20 LAB — CBC
HCT: 27.8 % — ABNORMAL LOW (ref 36.0–46.0)
Hemoglobin: 8.3 g/dL — ABNORMAL LOW (ref 12.0–15.0)
MCH: 28.3 pg (ref 26.0–34.0)
MCHC: 29.9 g/dL — ABNORMAL LOW (ref 30.0–36.0)
MCV: 94.9 fL (ref 80.0–100.0)
Platelets: 351 10*3/uL (ref 150–400)
RBC: 2.93 MIL/uL — ABNORMAL LOW (ref 3.87–5.11)
RDW: 15.8 % — ABNORMAL HIGH (ref 11.5–15.5)
WBC: 8.4 10*3/uL (ref 4.0–10.5)
nRBC: 0.2 % (ref 0.0–0.2)

## 2024-02-20 MED ORDER — SIMETHICONE 80 MG PO CHEW
80.0000 mg | CHEWABLE_TABLET | Freq: Four times a day (QID) | ORAL | Status: DC | PRN
  Administered 2024-02-23: 80 mg via ORAL
  Filled 2024-02-20 (×3): qty 1

## 2024-02-20 MED ORDER — SORBITOL 70 % SOLN
60.0000 mL | Freq: Once | Status: AC
  Administered 2024-02-20: 60 mL via ORAL

## 2024-02-20 MED ORDER — POTASSIUM CHLORIDE CRYS ER 20 MEQ PO TBCR
40.0000 meq | EXTENDED_RELEASE_TABLET | Freq: Once | ORAL | Status: AC
  Administered 2024-02-20: 40 meq via ORAL
  Filled 2024-02-20: qty 2

## 2024-02-20 NOTE — Progress Notes (Signed)
 Physical Therapy Session Note  Patient Details  Name: ANIESA BOBACK MRN: [id number] Date of Birth: 1960/10/18  Today's Date: 02/20/2024 PT Individual Time: 1030-1045 PT Individual Time Calculation (min): 15 min   Short Term Goals: Week 2:  PT Short Term Goal 1 (Week 2): Pt will maintain static standing balance up to x3' with LRAD  Skilled Therapeutic Interventions/Progress Updates: Patient supine in bed with husband and nsg present. Pt presented with verbally unrated pain (7-8/10 Cyrena Lee scale). Pt with high anxiety (cued to perform environmental awareness exercises of listing several things pt can see, touch, hear, smell - seemingly assist, but difficult to accurately assess to what degree as pt also receiving albuterol  from reports of difficulty breathing). Pt willing to get to EOB to see if it will assist with decreasing/distracting from pain in low back and abdomen (nsg present assessing). Pt CGA to get to sitting EOB with HOB elevated and pt with 8/10 pain Elmo Lee Scale) and laid back to supine. Pt maxA to scoot to [facility] still in pain + 2 via chuck. Pt wanting to finish rest of session, but PTA deferred rest of session due to pt's presentation of pain and high anxiety (nsg provided medication upon arrival, and going to provide pain medication following end of session) and pt encouraged to do as much as able once medication assisted.   Patient supine in bed at end of session with brakes locked, and all needs within reach.      Therapy Documentation Precautions:  Precautions Precautions: Fall Recall of Precautions/Restrictions: Intact Precaution/Restrictions Comments: TLSO brace (when ambulating), pt refused to don at evaluation Required Braces or Orthoses: Spinal Brace Spinal Brace: Thoracolumbosacral orthotic Restrictions Weight Bearing Restrictions Per Provider Order: No  Therapy/Group: Individual Therapy  Ruthie Berch PTA 02/20/2024, 3:34 PM

## 2024-02-20 NOTE — Consult Note (Addendum)
  Psychiatric Consult Note - Severe Anxiety Patient referred for psychiatric evaluation due to concerns of severe anxiety during current admission to the inpatient rehabilitation unit. At this time, the patient has not been directly evaluated by psychiatry. Chart review indicates that the patient is currently prescribed a broad spectrum of psychotropic medications, including citalopram , buspirone , duloxetine , and hydroxyzine , which offer adequate coverage for generalized anxiety and depressive symptoms. No medication adjustments are recommended at this time, and the patient is not a candidate for benzodiazepines due to her chronic pain regimen and concurrent use of opioid-level medications.  Of note, the patient has a history of Addison's disease and intermittently receives steroid therapy, which may exacerbate anxiety symptoms. This should be considered when evaluating fluctuations in mood or restlessness. She continues to experience chronic pain, poor sleep, and significant medical burden including pulmonary impairment, vertebral fractures, and AVN, all of which compound her emotional stress and functional limitations.  A neuropsychological consultation has been initiated and is appropriate given the patient's adjustment difficulties and persistent anxiety/depression. Continued focus on behavioral activation, paced activity, guided imagery, breathing exercises, and cognitive reframing have been recommended to assist with coping while hospitalized. Reinforcement of these strategies by the rehab team is encouraged.  If the patient's anxiety becomes acutely impairing, or if any safety concerns arise, please reach out to the psychiatry consult service for direct assessment. Thank you for your continued collaboration in the care of this complex patient.  Will dc psych consult.

## 2024-02-20 NOTE — Plan of Care (Signed)
  Problem: Consults Goal: RH SPINAL CORD INJURY PATIENT EDUCATION Description:  See Patient Education module for education specifics.  Outcome: Not Progressing   Problem: SCI BOWEL ELIMINATION Goal: RH STG MANAGE BOWEL WITH ASSISTANCE Description: STG Manage Bowel with toileting Assistance. Outcome: Not Progressing Goal: RH STG SCI MANAGE BOWEL WITH MEDICATION WITH ASSISTANCE Description: STG SCI Manage bowel with medication with mod I assistance. Outcome: Not Progressing Goal: RH STG SCI MANAGE BOWEL PROGRAM W/ASSIST OR AS APPROPRIATE Description: STG SCI Manage bowel program w/assist or as appropriate. Outcome: Not Progressing   Problem: SCI BLADDER ELIMINATION Goal: RH STG MANAGE BLADDER WITH ASSISTANCE Description: STG Manage Bladder With Assistance Outcome: Progressing Goal: RH STG MANAGE BLADDER WITH MEDICATION WITH ASSISTANCE Description: STG Manage Bladder With Medication With mod I Assistance. Outcome: Progressing Goal: RH STG SCI MANAGE BLADDER PROGRAM W/ASSISTANCE Description: Toileting protocol Outcome: Progressing   Problem: RH SAFETY Goal: RH STG ADHERE TO SAFETY PRECAUTIONS W/ASSISTANCE/DEVICE Description: STG Adhere to Safety Precautions With cues Assistance/Device. Outcome: Not Progressing   Problem: RH KNOWLEDGE DEFICIT SCI Goal: RH STG INCREASE KNOWLEDGE OF SELF CARE AFTER SCI Description: Patient and spouse will be able to manage care at discharge using educational resources for medication, skin care, transfers, toileting, dietary modification independently Outcome: Progressing

## 2024-02-20 NOTE — Progress Notes (Addendum)
 Occupational Therapy Session Note  Patient Details  Name: Kathleen [name] MRN: [id number] Date of Birth: 11-02-1960  Today's Date: 02/20/2024 OT Individual Time: 1145-1220 OT Individual Time Calculation (min): 35 min  Missed minutes: 10 minutes d/t MD visit   Short Term Goals: Week 2:  OT Short Term Goal 1 (Week 2): STG=LTG d/t ELOS  Skilled Therapeutic Interventions/Progress Updates:      Therapy Documentation Precautions:  Precautions Precautions: Fall Recall of Precautions/Restrictions: Intact Precaution/Restrictions Comments: TLSO brace (when ambulating), pt refused to don at evaluation Required Braces or Orthoses: Spinal Brace Spinal Brace: Thoracolumbosacral orthotic Restrictions Weight Bearing Restrictions Per Provider Order: No General: Pt supine in bed upon OT arrival, agreeable to OT session. Husband and daughter present  Pain: 10/10 pain reported in back, positioning, activity, intermittent rest breaks, distractions provided for pain management, pt reports tolerable to proceed.   ADL: OT completing family training with daughter and husband this session in order to prepare for D/C. Session focus primarily in transfer training and discussion of ADL abilities and use of AE as necessary. OT reiterating importance of slow pacing during tasks in order to prevent anxious though/behaviors during transfers/ADL tasks. OT recommending use of reacher to don LE clothing and pt reporting use of O2 concentrator at home so will not have to worry about managing tank within the home. Pt husband completing various transfers with pt throughout session with good carryover of information. OT also reiterating importance of coping/grounding strategies when feeling anxious and back precautions for safety.   Pt seated in W/C at end of session with W/C alarm donned, call light within reach and 4Ps assessed.    Therapy/Group: Individual Therapy  Camie Hoe, OTD, OTR/L 02/20/2024, 12:49 PM

## 2024-02-20 NOTE — Progress Notes (Signed)
 Physical Therapy Session Note  Patient Details  Name: Kathleen [name] MRN: [id number] Date of Birth: [birth year]  Today's Date: 02/20/2024 PT Individual Time: [phone] [id number] PT Individual Time Calculation (min): 30 min and 34 min.  Short Term Goals: Week 2:  PT Short Term Goal 1 (Week 2): Pt will maintain static standing balance up to x3' with LRAD  Skilled Therapeutic Interventions/Progress Updates:   First session:  Pt presents supine in bed, moaning, c/o abd pain as well as back pain of 10/10.  Nurse present for administration of pain meds and Zofran .  Pt states can't do anything.  PT will return in 20 minutes to progress therapy.  PT returns, pt agreeable to LE there ex in bed, c/o nausea.  Pt performed AP w/ cueing for full ROM, use of pillows at bottom for increase PF pressure.  Pt also performed HS w/ AAROM for increased ROM, abd/add and SAQ.  Pt c/o increased nausea and rest breaks given but no emesis, given emesis bag.  Pt exhibits jerking movements , falling asleep.  Pt O2 sats on 4 LPM at 95%, cues for breathing technique, pt mostly mouth-breathing.  Pt remained supine in bed w/ all needs in reach, spouse present.  Missed time of 30 min 2/2 pain, nausea.  Second session:  Pt presents semi-reclined in bed and agreeable to therapy.  Pt states 10/10 pain.  Pt transfers sup to sit w/ modified log roll and use of siderail.  Pt encouraged to perform slowly but continues to pull up w/ increased momentum and thus CGA 2/2 closeness to EOB and air mattress.  Pt sat EOB to don TLSO.  Educated pt on speed and safety.  Pt transfers sit to stand w/ mod A and spouse available for A.  Pt amb only x 4' to w/c, c/o increased pain and then nausea, although no emesis.  Pt states gonna pass out and I'm having a panic attack.  Pt calmed w/ concentration on breathing.  Pt received cold washcloth for head.  Pt amb back to bed and cautioned on slow turns and reaching for bed w/ hand.  Pt transferred sit to  supine w/ supervision, but increased screaming/moaning in pain and stating panic attack.  Pt calmed and assisted to [facility] by spouse +2.  O2 sats had decreased to 85% on 4 LPM, but increased to 91-92% w/ PLB.  Pt remained in bed w/ all needs in reach, spouse present.  Missed time of 11 min 2/2 pain and panic attacks.     Therapy Documentation Precautions:  Precautions Precautions: Fall Recall of Precautions/Restrictions: Intact Precaution/Restrictions Comments: TLSO brace (when ambulating), pt refused to don at evaluation Required Braces or Orthoses: Spinal Brace Spinal Brace: Thoracolumbosacral orthotic Restrictions Weight Bearing Restrictions Per Provider Order: No General: PT Amount of Missed Time (min): 30 Minutes PT Missed Treatment Reason: Patient unwilling to participate;Pain Vital Signs: Oxygen  Therapy SpO2: 95 % O2 Device: Room Air Pain: 10/10 LB, abdomen both sessions.      Therapy/Group: Individual Therapy  [name] P Sybil Shrader 02/20/2024, 8:54 AM

## 2024-02-20 NOTE — Progress Notes (Signed)
 PROGRESS NOTE   Subjective/Complaints: Pt reports abdominal discomfort due to constipation, did not get bowel prep yesterday unclear why. She would like to dry the sorbitol  today, we discussed enema if no results. She would like purewick, discussed avoiding to DC risk UTI and this will help her prepare for bladder control after DC home.    Vital stable    02/20/2024    4:19 AM 02/19/2024    9:52 PM 02/19/2024    7:57 PM  Vitals with BMI  Systolic 127  122  Diastolic 52  65  Pulse 87 86 87    ROS: Patient denies fever, rash, vomiting, diarrhea, shortness of breath or chest pain,headache  + back pain-continued  + anxiety- continued +Chronic urinary retention + constipation - continued + Abdominal pain/nausea   Objective:   DG Chest 2 View Result Date: 02/19/2024 CLINICAL DATA:  [id number] Nausea [id number] EXAM: CHEST - 2 VIEW COMPARISON:  Jan 21, 2024, Jan 20, 2024, August 04, 2024 FINDINGS: Redemonstrated reticular opacities in both upper lung zones and in the left lung base, consistent with multifocal scarring on multiple prior CTs. No new lobar consolidation, pleural effusion, or pneumothorax. Mediastinal prominence due to underlying fat deposition. No cardiomegaly. No acute fracture or destructive lesions. Multilevel thoracic osteophytosis. IMPRESSION: No lobar pneumonia or findings of pulmonary edema. No pleural effusion. Electronically Signed   By: Kathleen [name] M.D.   On: 02/19/2024 17:30   DG Abd 2 Views Result Date: 02/19/2024 CLINICAL DATA:  Nausea.  Morbid obesity. EXAM: ABDOMEN - 2 VIEW COMPARISON:  02/17/2024 FINDINGS: Oral contrast material and stool is seen throughout the entire length of colon. No evidence of dilated bowel loops. Right upper quadrant surgical clips noted from prior cholecystectomy. Old fracture deformity of the left pubis incidentally noted. IMPRESSION: Unremarkable bowel gas pattern. Oral contrast  material and stool throughout the colon. Electronically Signed   By: Kathleen [name] M.D.   On: 02/19/2024 17:19   DG Swallowing Func-Speech Pathology Result Date: 02/19/2024 Table formatting from the original result was not included. Modified Barium Swallow Study Patient Details Name: Kathleen [name] MRN: [id number] Date of Birth: [birth year] Today's Date: 02/19/2024 HPI/PMH: HPI: Pt is a 63 year old right handed female with history of chronic hypoxic respiratory failure (5 L O2) interstitial lung disease followed by pulmonary services Dr. Selinda [name] at [facility], sarcoidosis maintained on chronic prednisone , Addison's disease maintained on Cortef  followed by endocrinology services Dr.Gebreselassie [name], chronic hypotension, chronic anemia, chronic urinary incontinence, class III obesity BMI 39.75, bilateral hip AVN and not felt to be a surgical candidate, osteoporosis with multiple vertebral fractures on chronic narcotics followed at the [facility] pain clinic and recent admissions for intractable back pain due to compression fractures of L4 (4/12 - 4/16) and then L3 (5/9 - 5/14), readmitted 01/20/2024 with acute L1 fracture discharged to home 01/23/2024 with [facility]. Per chart review patient lives with spouse. 1 level home one-step to entry. Patient readmitted 01/25/2024 with increasing low back pain to [facility]. CT lumbar spine showed superior endplate fracture of L1 appearing stable with approximately 10 to 15% loss of height no retropulsion. Stable superior endplate fractures of L3 and  L4 and also 25 to 30% loss of height and minimal retropulsion of the posterior superior aspect of the vertebral bodies without significant associated canal stenosis. CT of the pelvis showed acute left pubic symphyseal and right sacral ala compression fractures/L4 superior endplate fracture as well as avascular necrosis of the left femoral head with mild articular collapse. Admission chemistries unremarkable except  chloride 94 CO2 30 glucose 145 alkaline phosphatase 129, hemoglobin 9.1, urine culture greater than 100,000 E. coli. Patient was transferred to [facility] due to increasing back pain. Interventional radiology consulted/Dr. Dolphus and discussed at length kyphoplasty which patient declined. TLSO back brace as directed. Hospital course patient did receive a 3-day course of intravenous ceftriaxone  for E. coli UTI 6/1 through 6/3. She was cleared to begin Lovenox  for DVT prophylaxis. Followed the distance by critical care medicine for pulmonary sarcoidosis/chronic hypoxic respiratory failure and currently on 4 L of oxygen  nasal cannula. Clinical Impression: Clinical Impression: Similar to MBSS completed 06/2023, pt presents w/ an overall functional oropharyngeal swallow. View was limited d/t pt positioning and body habitus, however, no airway invasion noted across trials. Of note, pt coughed immediately following pill trial (w/ thin liquids), though no penetration/aspiration was evident. Anticipate coughing was d/t hypersensation during upper esophageal clearance and/or anxiety. Pt was very anxious throughout the study and required frequent encouragement to continue. Unable to complete esophageal sweep d/t body habitus, however, pt would benefit from further esophageal assessment to r/o any complications that could be negatively impacting her overall swallow function. Skilled ST/oropharyngeal dysphagia intervention not warranted at this time. Factors that may increase risk of adverse event in presence of aspiration [name] & [name] 2021): Factors that may increase risk of adverse event in presence of aspiration [name] & [name] 2021): Frail or deconditioned Recommendations/Plan: Swallowing Evaluation Recommendations Swallowing Evaluation Recommendations Recommendations: PO diet PO Diet Recommendation: Regular; Thin liquids (Level 0) Liquid Administration via: Straw Medication Administration: Whole meds with  liquid Supervision: Patient able to self-feed Swallowing strategies  : Slow rate; Small bites/sips Postural changes: Stay upright 30-60 min after meals; Position pt fully upright for meals Oral care recommendations: Oral care BID (2x/day) Recommended consults: Consider GI consultation; Consider esophageal assessment Treatment Plan Treatment Plan Treatment recommendations: No treatment recommended at this time Follow-up recommendations: No SLP follow up Functional status assessment: Patient has not had a recent decline in their functional status. Recommendations Recommendations for follow up therapy are one component of a multi-disciplinary discharge planning process, led by the attending physician.  Recommendations may be updated based on patient status, additional functional criteria and insurance authorization. Assessment: Orofacial Exam: Orofacial Exam Oral Cavity: Oral Hygiene: WFL Oral Cavity - Dentition: Missing dentition; Adequate natural dentition Orofacial Anatomy: WFL Oral Motor/Sensory Function: WFL Anatomy: Anatomy: WFL Boluses Administered: Boluses Administered Boluses Administered: Thin liquids (Level 0); Mildly thick liquids (Level 2, nectar thick); Puree; Solid  Oral Impairment Domain: Oral Impairment Domain Lip Closure: No labial escape Tongue control during bolus hold: Cohesive bolus between tongue to palatal seal Bolus preparation/mastication: Timely and efficient chewing and mashing Bolus transport/lingual motion: Brisk tongue motion Oral residue: Complete oral clearance Location of oral residue : N/A Initiation of pharyngeal swallow : Posterior laryngeal surface of the epiglottis  Pharyngeal Impairment Domain: Pharyngeal Impairment Domain Soft palate elevation: No bolus between soft palate (SP)/pharyngeal wall (PW) Laryngeal elevation: Complete superior movement of thyroid  cartilage with complete approximation of arytenoids to epiglottic petiole Anterior hyoid excursion: Complete anterior  movement Epiglottic movement: Complete inversion Laryngeal vestibule closure: Complete,  no air/contrast in laryngeal vestibule Pharyngeal stripping wave : Present - complete Pharyngeal contraction (A/P view only): N/A Pharyngoesophageal segment opening: Complete distension and complete duration, no obstruction of flow Tongue base retraction: No contrast between tongue base and posterior pharyngeal wall (PPW) Pharyngeal residue: Collection of residue within or on pharyngeal structures Location of pharyngeal residue: Tongue base; Valleculae; Pyriform sinuses  Esophageal Impairment Domain: No data recorded Pill: Pill Consistency administered: Thin liquids (Level 0) Thin liquids (Level 0): [facility] Penetration/Aspiration Scale Score: Penetration/Aspiration Scale Score 1.  Material does not enter airway: Thin liquids (Level 0); Mildly thick liquids (Level 2, nectar thick); Puree; Solid; Pill Compensatory Strategies: Compensatory Strategies Compensatory strategies: No   General Information: Caregiver present: No  Diet Prior to this Study: Thin liquids (Level 0); Other (Comment) (soft)   Temperature : Normal   Respiratory Status: WFL   Supplemental O2: Nasal cannula (4L O2 via nasal cannula)   No data recorded Behavior/Cognition: Alert; Requires cueing Self-Feeding Abilities: Able to self-feed Baseline vocal quality/speech: Dysphonic Volitional Cough: Able to elicit Volitional Swallow: Able to elicit Exam Limitations: Limited visibility Goal Planning: No data recorded No data recorded No data recorded Patient/Family Stated Goal: to eat without choking Consulted and agree with results and recommendations: Patient Pain: Pain Assessment Faces Pain Scale: 8 Pain Location: back Pain Descriptors / Indicators: Grimacing; Crying Pain Intervention(s): Repositioned; Relaxation End of Session: Start Time:No data recorded Stop Time: No data recorded Time Calculation:No data recorded Charges: No data recorded SLP visit diagnosis: SLP  Visit Diagnosis: Other (comment) (anticipate esophageal dysphagia) Past Medical History: Past Medical History: Diagnosis Date  Adrenal insufficiency (HCC)   Anemia   Anxiety   Avascular bone necrosis (HCC)   Breast fibrocystic disorder   Chronic pain   Encounter for long-term opiate analgesic use 02/02/2020  Patient has chronic pain with femoral head avascular necrosis.  Is under pain contract  Fibromyalgia   Gastroesophageal reflux disease   Hypothyroidism   Mitral valve prolapse   Orthostatic hypotension   Peripheral neuropathy   PONV (postoperative nausea and vomiting)   Restrictive lung disease   Moderate to severe  Sarcoidosis   Biopsy proven - [facility]  Sarcoidosis   SOB (shortness of breath)   chronic Past Surgical History: Past Surgical History: Procedure Laterality Date  BIOPSY  10/17/2018  Procedure: BIOPSY;  Surgeon: Golda Claudis PENNER, MD;  [facility]: [facility];  Service: Endoscopy;;  duodenum, antrum, gastric body  BIOPSY  03/03/2021  Procedure: BIOPSY;  Surgeon: Golda Claudis PENNER, MD;  [facility]: [facility];  Service: Endoscopy;;  BIOPSY  09/11/2023  Procedure: BIOPSY;  Surgeon: Shaaron Lamar HERO, MD;  [facility]: [facility];  Service: Endoscopy;;  BREAST LUMPECTOMY Bilateral 01/12/2011  CHOLECYSTECTOMY N/A 04/17/2021  Procedure: LAPAROSCOPIC CHOLECYSTECTOMY;  Surgeon: Mavis Anes, MD;  Location: [facility];  Service: General;  Laterality: N/A;  COLONOSCOPY WITH PROPOFOL  N/A 01/03/2022  Procedure: COLONOSCOPY WITH PROPOFOL ;  Surgeon: Golda Claudis PENNER, MD;  [facility]: [facility];  Service: Endoscopy;  Laterality: N/A;  220  ESOPHAGEAL DILATION N/A 03/03/2021  Procedure: ESOPHAGEAL DILATION;  Surgeon: Golda Claudis PENNER, MD;  [facility];  Service: Endoscopy;  Laterality: N/A;  ESOPHAGOGASTRODUODENOSCOPY (EGD) WITH PROPOFOL  N/A 10/17/2018  Procedure: ESOPHAGOGASTRODUODENOSCOPY (EGD) WITH PROPOFOL ;  Surgeon: Golda Claudis PENNER, MD;  [facility];  Service: Endoscopy;  Laterality: N/A;  2:25   ESOPHAGOGASTRODUODENOSCOPY (EGD) WITH PROPOFOL  N/A 03/03/2021  Procedure: ESOPHAGOGASTRODUODENOSCOPY (EGD) WITH PROPOFOL ;  Surgeon: Golda Claudis PENNER, MD;  [facility];  Service: Endoscopy;  Laterality: N/A;  12:15  ESOPHAGOGASTRODUODENOSCOPY (EGD) WITH PROPOFOL  N/A 09/11/2023  Procedure: ESOPHAGOGASTRODUODENOSCOPY (EGD) WITH PROPOFOL ;  Surgeon: Shaaron Lamar HERO, MD;  [facility];  Service: Endoscopy;  Laterality: N/A;  2:30 pm, asa 3, LM to see if pt can come earlier  [facility] DILATION N/A 09/11/2023  Procedure: AGAPITO DILATION;  Surgeon: Shaaron Lamar HERO, MD;  [facility];  Service: Endoscopy;  Laterality: N/A;  ORIF ANKLE FRACTURE Left 03/10/2023  Procedure: OPEN REDUCTION INTERNAL FIXATION (ORIF) ANKLE FRACTURE;  Surgeon: Georgina Ozell LABOR, MD;  Location: [facility];  Service: Orthopedics;  Laterality: Left;  POLYPECTOMY  01/03/2022  Procedure: POLYPECTOMY INTESTINAL;  Surgeon: Golda Claudis PENNER, MD;  [facility];  Service: Endoscopy;;  TUBAL LIGATION   [name] LABOR Mole 02/19/2024, 11:15 AM   Recent Labs    02/20/24 0509  WBC 8.4  HGB 8.3*  HCT 27.8*  PLT 351    Recent Labs    02/20/24 0509  NA 139  K 3.2*  CL 100  CO2 36*  GLUCOSE 113*  BUN 6*  CREATININE 0.86  CALCIUM  8.2*    No intake or output data in the 24 hours ending 02/20/24 1546       Physical Exam: Vital Signs Blood pressure (!) 127/52, pulse 87, temperature 98.2 F (36.8 C), temperature source Oral, resp. rate 17, height 5' 10 (1.778 m), weight 124.9 kg, last menstrual period 05/17/2016, SpO2 95%.  Constitutional: No apparent distress. Appropriate appearance for age.  Obese.  Laying in bed, moaning at times-reports due to back discomfort HENT: No JVD. Neck Supple. Trachea midline. Atraumatic, normocephalic. Eyes: PERRLA. EOMI. Visual fields grossly intact.  Cardiovascular: RRR, no murmurs/rub/gallops.  2+ right lower extremity, 1+ left lower extremity edema. Peripheral pulses 2+   Respiratory: CTAB. No rales, rhonchi, or wheezing. On 4 L nasal cannula. Abdomen: + bowel sounds, normoactive,  Distention, mild diffuse TTP, without guarding or palpable deformity.   Skin: C/D/I. No apparent lesions. MSK:      No apparent deformity.       Neurologic exam:  Awake, alert, oriented x 4.  No apparent deficits.  Cranial nerve exam 2 through 12 intact.  Intact to light touch throughout. Ue's5-/5 in Deltoids, biceps, triceps; WE 4/5- give way weakness; Grip 4+/5 and FA 4+/5 B/L LE's- 5-/5 throughout B/L   Prior neuro assessment is c/w today's exam 02/20/2024.      Assessment/Plan: 1. Functional deficits which require 3+ hours per day of interdisciplinary therapy in a comprehensive inpatient rehab setting. Physiatrist is providing close team supervision and 24 hour management of active medical problems listed below. Physiatrist and rehab team continue to assess barriers to discharge/monitor patient progress toward functional and medical goals  Care Tool:  Bathing    Body parts bathed by patient: Left arm, Right arm, Abdomen, Chest, Face   Body parts bathed by helper: Front perineal area, Buttocks, Right upper leg, Left upper leg, Right lower leg, Left lower leg     Bathing assist Assist Level: Minimal Assistance - Patient > 75%     Upper Body Dressing/Undressing Upper body dressing   What is the patient wearing?: Orthosis, Pull over shirt Orthosis activity level: Performed by helper  Upper body assist Assist Level: Supervision/Verbal cueing    Lower Body Dressing/Undressing Lower body dressing      What is the patient wearing?: Incontinence brief, Pants     Lower body assist Assist for lower body dressing: Minimal  Assistance - Patient > 75%     Editor, commissioning assist Assist for toileting: Minimal Assistance - Patient > 75%     Transfers Chair/bed transfer  Transfers assist     Chair/bed transfer assist level: Moderate Assistance  - Patient 50 - 74% (low surface)     Locomotion Ambulation   Ambulation assist      Assist level: Contact Guard/Touching assist Assistive device: Walker-rolling Max distance: x88'   Walk 10 feet activity   Assist  Walk 10 feet activity did not occur: Safety/medical concerns  Assist level: Contact Guard/Touching assist Assistive device: Walker-rolling   Walk 50 feet activity   Assist Walk 50 feet with 2 turns activity did not occur: Safety/medical concerns  Assist level: Contact Guard/Touching assist Assistive device: Walker-rolling    Walk 150 feet activity   Assist Walk 150 feet activity did not occur: Safety/medical concerns (pt too fatigued, SOB)         Walk 10 feet on uneven surface  activity   Assist Walk 10 feet on uneven surfaces activity did not occur: Safety/medical concerns         Wheelchair     Assist Is the patient using a wheelchair?: Yes Type of Wheelchair: Manual Wheelchair activity did not occur: Safety/medical concerns  Wheelchair assist level: Supervision/Verbal cueing Max wheelchair distance: x8'    Wheelchair 50 feet with 2 turns activity    Assist    Wheelchair 50 feet with 2 turns activity did not occur: Safety/medical concerns   Assist Level: Dependent - Patient 0%   Wheelchair 150 feet activity     Assist  Wheelchair 150 feet activity did not occur: Safety/medical concerns   Assist Level: Dependent - Patient 0%   Blood pressure (!) 127/52, pulse 87, temperature 98.2 F (36.8 C), temperature source Oral, resp. rate 17, height 5' 10 (1.778 m), weight 124.9 kg, last menstrual period 05/17/2016, SpO2 95%.  Medical Problem List and Plan: 1. Functional deficits secondary to compression and multiple fractures of spine without SCI-intractable acute on chronic low back pain due to osteoporotic fracture involving L1, L3 and L4/acute left pubic symphysis and right sacral ala fracture.  Patient declined  kyphoplasty.  Conservative management with narcotics and TLSO             -patient may  shower             -ELOS/Goals: 12-14 days- supervision to min A             -Continue CIR therapies including PT, OT   - L-spine x-ray worsening compression deformity L1 level  -Exp dc 7/1  -Code status changed to full code after discussion with patient- she requested this be changed   2.  Antithrombotics: -DVT/anticoagulation:  Pharmaceutical: Lovenox .  Check vascular study 6/13- Dopplers (-)             -antiplatelet therapy: N/A 3. Pain Management/chronic pain followed at [facility] pain clinic- usually takes 10 mg 6x/day: Flexeril  5 mg 3 times daily, lidoderm  patch, oxycodone  30 mg every 4 hours as needed upon transfer- attempting, with d/w pt and husband, to add MS Contin  15 mg BID and reduce Oxycodone  to 20 mg q4 hours  6/13- changed to Oxycontin  10 mg BID and continue Oxy 20 mg q4 hours prn  6/16 increase oxycontin  to 15mg  Q12h  6/17 Increase OxyContin  to 20 mg every 12 hours 4. Mood/Behavior/Sleep:  Pt has severe anxiety Cymbalta  60 mg daily, BuSpar  15 mg  3 times daily, Atarax  25 mg 3 times daily as needed             -added Celexa  10 mg daily for anxiety- is better for anxiety than Duloxetine , but need Duloxetine  for pain control  - 6/17 seen by neuropsychology for anxiety with depression, appreciate assistance             -antipsychotic agents: N/A  -6/25 consult psychiatry for severe anxiety- no medication changes recommended 5. Neuropsych/cognition: This patient is ?capable of making decisions on her own behalf. 6. Skin/Wound Care: Routine skin checks 7. Fluids/Electrolytes/Nutrition: Routine in and outs with follow-up chemistries  8.  Sarcoidosis/interstitial lung disease/chronic respiratory failure with hypoxia- Was on 4-5L at home- been doing 3-4L here- and concern for hypercarbia.  Continue chronic oxygen  therapy.  Prednisone  as directed, Lasix  20 mg Monday Wednesday Fridays.  Followed by  Dr. Selinda [name] pulmonary services [facility]  6/14- some sob this morning but VS ok. ?anxiety component  -she is receiving brovana  and pulmicort  scheduled with albuterol  prn  -added IS/ and FV today  6/15 breathing better today. I asked her if she took prednisone  with hydrocortisone , and she told me she did at one point, but no longer.    -obsv for now  breathing doing ok- at basline, continue to monitor  Discussed Incentive spirometer SpO2: 95 % O2 Flow Rate (L/min): 5 L/min FiO2 (%): 40 % 6/22-4: Remains on 4 L nasal cannula, satting well. 6/25 chest CXR, order mucinex  for 3 days CXR- no acute changes, may be anxiety contibuting 9.  Left femoral head avascular necrosis.Probably has bilaterally, based on chart review  Not a surgical candidate.  Conservative care  6/13- went over with therapy- that her goals from my point of view is to walk 15-25 ft, but probably not more- will not make AVN worse, but will hurt more-   10.  E. coli UTI as well as chronic urinary incontinence.  Status post 3-day course of IV Rocephin  completed.  Continue Flomax  0.4 mg daily.  Check PVR  6/14-15 pt is voiding continently. No PVR's but I'm not sure they'reneeded right now  6/16 check PVRs, pt reports she doesn't empty completely - do not see any completed yet 6/19 recheck U/A and culture for dysuria, ask nursing completed PVRs  -Start keflex - monitor cultures- pseudomonas- sensitivities pending- Change to Levofloxacin  6/21: cx + pseudomonas; given n.p.o. will switch to IV cefepime  2 g q8h 6/22: Continence improving, continue IV cefepime  for now.  Discussed risks of Foley placement for incontinence given patient discomfort with transferring to bedside commode, she is agreeable to continuing this intermittent straight cathing as needed; husband has been trained on straight cathing in the past and is comfortable doing this at discharge if needed 6/23 continues with intermittent catheterizations, continue current regimen  and monitor 6/24 increase flomax  to 0.8mg  6/25 antibiotic discontinued after discussion with pharmacy, has completed 7 days 6/26 discussed avoiding use of purewick in preparation for discharge home and can increase UTI risk    11.  Normocytic anemia.  Borderline MCV of 96 and RDW of 16.  B12 and folate within normal limits.  Check CBC  6/14- CBC HGB stable at 8.3  6/26 stable hgb at 8.3 12.  Addison's disease.  Continue hydrocortisone  chronically.  Followed by endocrinology services Dr.Gebreselassie [name] 13.  Chronic hypotension.  ProAmatine  5 mg twice daily    02/20/2024    4:19 AM 02/19/2024    9:52 PM 02/19/2024    7:57  PM  Vitals with BMI  Systolic 127  122  Diastolic 52  65  Pulse 87 86 87  Controlled continue current regimen  14.  Class III morbid obesity.  BMI 39.75.  Dietary follow-up 15.  Hyperlipidemia.  Crestor  16.  Hypothyroidism.  Synthroid  17.  Chronic constipation/ileus.  Linzess  72 mcg daily, Senokot S2 tablets bedtime, Chronulac  as needed  6/13- LBM 2 days ago- she says doesn't go every day  6/15 I suspect her nausea is related to this. We also started fosamax  yesterday. Discussed how oxycodone  can cause nausea and constipation   -  KUB today reveals moderate stool burden, no ileus   -will order fleets enema   -rx nausea, increase ppi to bid   -increase senna to bid  6/16 reports good results with enema yesterday, continue to monitor   6/17 LBM 6/15 consider additional medication if no BM by then  6/18 Patient reports BM yesterday, continue to monitor   6/20 Sorbitol  ordered this AM- no results will order xray abdomen and Enema- discussed with nursing- had BM with this, add miralax   Small, type I bowel movement 6/20  6/21: KUB with likely ileus--diffuse distention and abdominal pain with slow bowel sounds-- will consult surgery per addendum yesterday.  Started on maintenance IV fluids  6/22: Surgery evaluated yesterday, deemed nonop management, diet as tolerated.   Tolerated liquid diet overnight, will advance to soft today.  DC IV fluids.  Start Reglan  5 mg 3 times daily.  Getting soapsuds enema tonight given no BM for 2 days.  -- Given she is passing flatus, if adequate bowel movement tonight and good control of symptoms in AM, would consider advancement to full diet.  Will get KUB for a.m.  6/23 continue soft diet today, KUB nonobstructive today, mild stool burden.  Will hold off on increase of pain medications although she does have back pain as do not want to worsen this  6/24 upgrade diet to regular, consider DC reglan , ate 85% meal today, increase miralax  to TID  6/25 recheck abdomina xray assess stool burdon/nausea. Will do bowel prep 1/2 container for constipation  6/27 Xray abdomen no acute changes, sorbitol  ordered for consitpation, she wasn't given bowel prep yesterday?, discussed enema if no results  18.  GERD.  Protonix  19. Noncompliance- Per nursing, pt refusing to use TLSO for [facility] transfers, of note- will d/w pt that need to follow recs, or there's more risk of additional fractures.  20. Hypokalemia  6/13- will replete 40 mEq and then recheck Monday  6/23 low normal potassium at 3.5, will add 20 mEq x 1  6/26 K+ repletion ordered  21. Azotemia  -6/18 Cr 1.04 encourage oral fluids-order placed  -6/23 --stable creatinine 1.0, encourage oral intake  -6/26 Cr improved to 0.86   22. Dysphagia  -SLP consult completed  LOS: 14 days A FACE TO FACE EVALUATION WAS PERFORMED  Murray Collier 02/20/2024, 3:46 PM

## 2024-02-20 NOTE — Progress Notes (Signed)
 Ok to replace k 3.2 with x1 per Dr. Urbano.   Sergio Batch, PharmD, BCIDP, AAHIVP, CPP Infectious Disease Pharmacist 02/20/2024 9:10 AM

## 2024-02-21 DIAGNOSIS — R079 Chest pain, unspecified: Secondary | ICD-10-CM

## 2024-02-21 LAB — BASIC METABOLIC PANEL WITH GFR
Anion gap: 8 (ref 5–15)
BUN: 5 mg/dL — ABNORMAL LOW (ref 8–23)
CO2: 32 mmol/L (ref 22–32)
Calcium: 8.4 mg/dL — ABNORMAL LOW (ref 8.9–10.3)
Chloride: 98 mmol/L (ref 98–111)
Creatinine, Ser: 0.81 mg/dL (ref 0.44–1.00)
GFR, Estimated: >60 mL/min (ref >60)
Glucose, Bld: 90 mg/dL (ref 70–99)
Potassium: 3.6 mmol/L (ref 3.5–5.1)
Sodium: 138 mmol/L (ref 135–145)

## 2024-02-21 LAB — TROPONIN I (HIGH SENSITIVITY)
Troponin I (High Sensitivity): 8 ng/L (ref <18)
Troponin I (High Sensitivity): 8 ng/L (ref <18)

## 2024-02-21 MED ORDER — ALUM & MAG HYDROXIDE-SIMETH 200-200-20 MG/5ML PO SUSP
15.0000 mL | Freq: Four times a day (QID) | ORAL | Status: DC | PRN
  Administered 2024-02-21 – 2024-02-24 (×4): 15 mL via ORAL
  Filled 2024-02-21 (×5): qty 30

## 2024-02-21 MED ORDER — BIOTENE DRY MOUTH MT LIQD
15.0000 mL | OROMUCOSAL | Status: DC | PRN
  Administered 2024-02-22: 15 mL via OROMUCOSAL

## 2024-02-21 NOTE — Progress Notes (Signed)
 Endorsing wanting to be catheterized with foley. Explained Providers have discussed with her that at this time no indication or reason to be catheterized or have an indwelling catheter placed. PVR completed voided 400 ml with PVR of 186 ml. Asked patient to describe why she wants catheter and what she is feeling. Endorsing after voiding has the sensation to continue to void causing her discomfort. Also, the sensation of feeling full.

## 2024-02-21 NOTE — Progress Notes (Signed)
 Physical Therapy Session Note  Patient Details  Name: Kathleen [name] MRN: [id number] Date of Birth: 09/10/1960  Today's Date: 02/21/2024 PT Individual Time: 1400-1500 PT Individual Time Calculation (min): 60 min   Short Term Goals: Week 1:  PT Short Term Goal 1 (Week 1): Pt will complete rolling in bed with supervision PT Short Term Goal 1 - Progress (Week 1): Met PT Short Term Goal 2 (Week 1): Pt will complete supine<->sit with CGA. PT Short Term Goal 2 - Progress (Week 1): Met PT Short Term Goal 3 (Week 1): Pt will complete sit to stand with CGA + LRAD PT Short Term Goal 3 - Progress (Week 1): Progressing toward goal PT Short Term Goal 4 (Week 1): Pt will maintain dynamic standing balance with minA + LRAD PT Short Term Goal 4 - Progress (Week 1): Progressing toward goal PT Short Term Goal 5 (Week 1): Pt will ambulate x75' with LRAD CGA Week 2:  PT Short Term Goal 1 (Week 2): Pt will maintain static standing balance up to x3' with LRAD  Skilled Therapeutic Interventions/Progress Updates:  Pt up in w/c when PT arrived. Pt agreeable to PT session, however, reports she has fluid on her lungs and would be able to walk if they got it out of her.  During session, pt spoke with PA, Rolan who explained her recent diagnostic readings/findings and she does not have any fluid on her lungs, also encouraged pt to continue with activity/mobility.  Pt agreed to donning TSLO brace. Pt wheeled down to gym.  Pt attempted x1 trial of gait tx with RW. Pt continues to requires x2 mod/max assist for w/c->RW xfer.  Course set up for pt to step over x2 obstacles ~2 ft from each other while ambulating with RW.  Pt able to accomplish tasks, however, could not continue and requesting to stop. Pt amb ~x8' total. Pt willing to participate in seated ex's with 1# ankle wts kicking ball alternating LE's to fatigue, marching, LAQs ~x10-12 reps each.  Pt requesting PT remove brace so she can breathe.  SPO2 staying 90-97%.  Pt  wheeled herself back from main gym to nursing station using LE/UE. Pt requesting to get into bed. W/C->Bed with RW mod/max assist x2 out of w/c, pt able to turn/walk to bed to sit at EOB. Sit->supine using railing, lifting her LE's into bed with supervision, x2 max assist to position pt HOB. Pt left comfortable with all needs in reach.  PT discussed importance of diet, hydration during therapy. Pt able to perform today's session without panic attack.       Therapy Documentation Precautions:  Precautions Precautions: Fall Recall of Precautions/Restrictions: Intact Precaution/Restrictions Comments: TLSO brace (when ambulating), pt refused to don at evaluation Required Braces or Orthoses: Spinal Brace Spinal Brace: Thoracolumbosacral orthotic Restrictions Weight Bearing Restrictions Per Provider Order: No  Pain: Pain Assessment Pain Scale: 0-10 Pain Score: 10-Worst pain ever Pain Location: Back Pain Intervention(s): Medication (See eMAR)     Therapy/Group: Individual Therapy  [name] Fast 02/21/2024, 4:14 PM

## 2024-02-21 NOTE — Progress Notes (Signed)
 Patient ID: Kathleen [name], female   DOB: [birth year], 63 y.o.   MRN: [id number] Husband was here for family training and it went well. Her parent are also here asking questions regarding her recovery and independence. Husband is good with her care and I & O caths he was doing at home with her. Will be set for discharge on 7/1

## 2024-02-21 NOTE — Progress Notes (Signed)
 Physical Therapy Session Note  Patient Details  Name: Kathleen [name] MRN: [id number] Date of Birth: May 12, 1961  Today's Date: 02/21/2024 PT Individual Time: 1100-1200 PT Individual Time Calculation (min): 60 min   Short Term Goals: Week 1:  PT Short Term Goal 1 (Week 1): Pt will complete rolling in bed with supervision PT Short Term Goal 1 - Progress (Week 1): Met PT Short Term Goal 2 (Week 1): Pt will complete supine<->sit with CGA. PT Short Term Goal 2 - Progress (Week 1): Met PT Short Term Goal 3 (Week 1): Pt will complete sit to stand with CGA + LRAD PT Short Term Goal 3 - Progress (Week 1): Progressing toward goal PT Short Term Goal 4 (Week 1): Pt will maintain dynamic standing balance with minA + LRAD PT Short Term Goal 4 - Progress (Week 1): Progressing toward goal PT Short Term Goal 5 (Week 1): Pt will ambulate x75' with LRAD CGA Week 2:  PT Short Term Goal 1 (Week 2): Pt will maintain static standing balance up to x3' with LRAD  Skilled Therapeutic Interventions/Progress Updates:    Pt in bed when PT arrived, agreeable to PT session.  Pt's husband and parents present during visit.  Pt transferred supine->sit @ EOB with supervision/CGA as needed for safety. EOB without back support tolerating x3'. Bed->W/C transfers supervision using RW.  Pt wheeled to main gym.  Pt able to transfer w/c->RW with min/mod assist x1 today.  In standing, pt requires cueing to relax shoulders, focus on her breathing technique. Pt amb x5' before having to rest and verbalizing having a panic attack.  Cueing needed to relax, focus on breathing, pt able to control attack. Pt concerned with SPO2 level throughout session.  Pt able to keep her SPO2 between 89-98% during session.  Pt unable to participate further and willing to self propel back to gym in w/c with x3 rest breaks. Pt left in room in w/c with family present. Throughout visit, pt anxious and inquiring about seeing MD, nurses, SW in preparation for d/c.  Pt did speak with PA and felt good about their conversation.   Therapy Documentation Precautions:  Precautions Precautions: Fall Recall of Precautions/Restrictions: Intact Precaution/Restrictions Comments: TLSO brace (when ambulating), pt refused to don at evaluation Required Braces or Orthoses: Spinal Brace Spinal Brace: Thoracolumbosacral orthotic Restrictions Weight Bearing Restrictions Per Provider Order: No    Pain: Pain Assessment Pain Scale: 0-10 Pain Score: 10-Worst pain ever Pain Location: Back Pain Intervention(s): Medication (See eMAR)     Therapy/Group: Individual Therapy  Kathleen [name] Fast 02/21/2024, 1:33 PM

## 2024-02-21 NOTE — Plan of Care (Signed)
  Problem: Consults Goal: RH SPINAL CORD INJURY PATIENT EDUCATION Description:  See Patient Education module for education specifics.  Outcome: Progressing   Problem: SCI BOWEL ELIMINATION Goal: RH STG MANAGE BOWEL WITH ASSISTANCE Description: STG Manage Bowel with toileting Assistance. Outcome: Progressing Goal: RH STG SCI MANAGE BOWEL WITH MEDICATION WITH ASSISTANCE Description: STG SCI Manage bowel with medication with mod I assistance. Outcome: Progressing Goal: RH STG SCI MANAGE BOWEL PROGRAM W/ASSIST OR AS APPROPRIATE Description: STG SCI Manage bowel program w/assist or as appropriate. Outcome: Progressing   Problem: SCI BLADDER ELIMINATION Goal: RH STG MANAGE BLADDER WITH ASSISTANCE Description: STG Manage Bladder With Assistance Outcome: Progressing Goal: RH STG MANAGE BLADDER WITH MEDICATION WITH ASSISTANCE Description: STG Manage Bladder With Medication With mod I Assistance. Outcome: Progressing Goal: RH STG SCI MANAGE BLADDER PROGRAM W/ASSISTANCE Description: Toileting protocol Outcome: Progressing   Problem: RH SAFETY Goal: RH STG ADHERE TO SAFETY PRECAUTIONS W/ASSISTANCE/DEVICE Description: STG Adhere to Safety Precautions With cues Assistance/Device. Outcome: Progressing

## 2024-02-21 NOTE — Progress Notes (Signed)
 IV team notified of midline not returning blood after being flushed with 20ml NS. IV team responded that lab is to draw her labs. This RN made lab tech aware.

## 2024-02-21 NOTE — Progress Notes (Signed)
 PROGRESS NOTE   Subjective/Complaints: Pt reported chest/epigastric discomfort today.  Continues to have anxiety.  Had bowel movement today and yesterday.  Vital stable    02/21/2024    1:50 PM 02/21/2024   12:23 PM 02/21/2024    9:00 AM  Vitals with BMI  Weight 278 lbs 11 oz 288 lbs   BMI 39.99 41.32   Systolic   113  Diastolic   45  Pulse   85    ROS: Patient denies fever, rash, vomiting, diarrhea, shortness of breath headache  + back pain-continued  + anxiety- continued +Chronic urinary retention + constipation - continued + Abdominal pain/nausea   Objective:   DG Chest 2 View Result Date: 02/19/2024 CLINICAL DATA:  [id number] Nausea [id number] EXAM: CHEST - 2 VIEW COMPARISON:  Jan 21, 2024, Jan 20, 2024, August 04, 2024 FINDINGS: Redemonstrated reticular opacities in both upper lung zones and in the left lung base, consistent with multifocal scarring on multiple prior CTs. No new lobar consolidation, pleural effusion, or pneumothorax. Mediastinal prominence due to underlying fat deposition. No cardiomegaly. No acute fracture or destructive lesions. Multilevel thoracic osteophytosis. IMPRESSION: No lobar pneumonia or findings of pulmonary edema. No pleural effusion. Electronically Signed   By: Rogelia Myers M.D.   On: 02/19/2024 17:30   DG Abd 2 Views Result Date: 02/19/2024 CLINICAL DATA:  Nausea.  Morbid obesity. EXAM: ABDOMEN - 2 VIEW COMPARISON:  02/17/2024 FINDINGS: Oral contrast material and stool is seen throughout the entire length of colon. No evidence of dilated bowel loops. Right upper quadrant surgical clips noted from prior cholecystectomy. Old fracture deformity of the left pubis incidentally noted. IMPRESSION: Unremarkable bowel gas pattern. Oral contrast material and stool throughout the colon. Electronically Signed   By: Norleen DELENA Kil M.D.   On: 02/19/2024 17:19    Recent Labs    02/20/24 0509  WBC 8.4   HGB 8.3*  HCT 27.8*  PLT 351    Recent Labs    02/20/24 0509 02/21/24 0542  NA 139 138  K 3.2* 3.6  CL 100 98  CO2 36* 32  GLUCOSE 113* 90  BUN 6* 5*  CREATININE 0.86 0.81  CALCIUM  8.2* 8.4*     Intake/Output Summary (Last 24 hours) at 02/21/2024 1434 Last data filed at 02/21/2024 1258 Gross per 24 hour  Intake 240 ml  Output 686 ml  Net -446 ml         Physical Exam: Vital Signs Blood pressure (!) 113/45, pulse 85, temperature 98.1 F (36.7 C), temperature source Oral, resp. rate 16, height 5' 10 (1.778 m), weight 126.4 kg, last menstrual period 05/17/2016, SpO2 (!) 89%.  Constitutional: No apparent distress. Appropriate appearance for age.  Obese.  Laying in bed, moaning at times-reports due to back discomfort HENT: No JVD. Neck Supple. Trachea midline. Atraumatic, normocephalic. Eyes: PERRLA. EOMI. Visual fields grossly intact.  Cardiovascular: RRR, no murmurs/rub/gallops.  1+ right lower extremity, 1+ left lower extremity edema.  NO chest wall TTP, reports discomfort around epigastric region Respiratory: CTAB. No rales, rhonchi, or wheezing. On 4 L nasal cannula. Abdomen: + bowel sounds, normoactive,  Protuberant, mild diffuse TTP, without guarding or palpable deformity.  Skin: C/D/I. No apparent lesions. MSK:      No apparent deformity.       Neurologic exam:  Awake, alert, oriented x 4.  No apparent deficits.  Cranial nerve exam 2 through 12 intact.  Intact to light touch throughout. Ue's5-/5 in Deltoids, biceps, triceps; WE 4/5- give way weakness; Grip 4+/5 and FA 4+/5 B/L LE's- 5-/5 throughout B/L   Prior neuro assessment is c/w today's exam 02/21/2024.      Assessment/Plan: 1. Functional deficits which require 3+ hours per day of interdisciplinary therapy in a comprehensive inpatient rehab setting. Physiatrist is providing close team supervision and 24 hour management of active medical problems listed below. Physiatrist and rehab team continue to  assess barriers to discharge/monitor patient progress toward functional and medical goals  Care Tool:  Bathing    Body parts bathed by patient: Left arm, Right arm, Abdomen, Chest, Face   Body parts bathed by helper: Front perineal area, Buttocks, Right upper leg, Left upper leg, Right lower leg, Left lower leg     Bathing assist Assist Level: Minimal Assistance - Patient > 75%     Upper Body Dressing/Undressing Upper body dressing   What is the patient wearing?: Pull over shirt Orthosis activity level: Performed by helper  Upper body assist Assist Level: Set up assist    Lower Body Dressing/Undressing Lower body dressing      What is the patient wearing?: Pants     Lower body assist Assist for lower body dressing: Moderate Assistance - Patient 50 - 74%     Toileting Toileting    Toileting assist Assist for toileting: Minimal Assistance - Patient > 75%     Transfers Chair/bed transfer  Transfers assist     Chair/bed transfer assist level: Supervision/Verbal cueing     Locomotion Ambulation   Ambulation assist      Assist level: Supervision/Verbal cueing Assistive device: Walker-rolling Max distance: 5'   Walk 10 feet activity   Assist  Walk 10 feet activity did not occur: Safety/medical concerns  Assist level: Contact Guard/Touching assist Assistive device: Walker-rolling   Walk 50 feet activity   Assist Walk 50 feet with 2 turns activity did not occur: Safety/medical concerns  Assist level: Contact Guard/Touching assist Assistive device: Walker-rolling    Walk 150 feet activity   Assist Walk 150 feet activity did not occur: Safety/medical concerns (pt too fatigued, SOB)         Walk 10 feet on uneven surface  activity   Assist Walk 10 feet on uneven surfaces activity did not occur: Safety/medical concerns         Wheelchair     Assist Is the patient using a wheelchair?: Yes Type of Wheelchair: Manual Wheelchair  activity did not occur: Safety/medical concerns  Wheelchair assist level: Supervision/Verbal cueing Max wheelchair distance: X230'    Wheelchair 50 feet with 2 turns activity    Assist    Wheelchair 50 feet with 2 turns activity did not occur: Safety/medical concerns   Assist Level: Supervision/Verbal cueing   Wheelchair 150 feet activity     Assist  Wheelchair 150 feet activity did not occur: Safety/medical concerns   Assist Level: Supervision/Verbal cueing   Blood pressure (!) 113/45, pulse 85, temperature 98.1 F (36.7 C), temperature source Oral, resp. rate 16, height 5' 10 (1.778 m), weight 126.4 kg, last menstrual period 05/17/2016, SpO2 (!) 89%.  Medical Problem List and Plan: 1. Functional deficits secondary to compression and multiple fractures of spine without SCI-intractable  acute on chronic low back pain due to osteoporotic fracture involving L1, L3 and L4/acute left pubic symphysis and right sacral ala fracture.  Patient declined kyphoplasty.  Conservative management with narcotics and TLSO             -patient may  shower             -ELOS/Goals: 12-14 days- supervision to min A             -Continue CIR therapies including PT, OT   - L-spine x-ray worsening compression deformity L1 level  -Exp dc 7/1  -Code status changed to full code after discussion with patient- she requested this be changed   2.  Antithrombotics: -DVT/anticoagulation:  Pharmaceutical: Lovenox .  Check vascular study 6/13- Dopplers (-)             -antiplatelet therapy: N/A 3. Pain Management/chronic pain followed at [facility] pain clinic- usually takes 10 mg 6x/day: Flexeril  5 mg 3 times daily, lidoderm  patch, oxycodone  30 mg every 4 hours as needed upon transfer- attempting, with d/w pt and husband, to add MS Contin  15 mg BID and reduce Oxycodone  to 20 mg q4 hours  6/13- changed to Oxycontin  10 mg BID and continue Oxy 20 mg q4 hours prn  6/16 increase oxycontin  to 15mg  Q12h  6/17  Increase OxyContin  to 20 mg every 12 hours 4. Mood/Behavior/Sleep:  Pt has severe anxiety Cymbalta  60 mg daily, BuSpar  15 mg 3 times daily, Atarax  25 mg 3 times daily as needed             -added Celexa  10 mg daily for anxiety- is better for anxiety than Duloxetine , but need Duloxetine  for pain control  - 6/17 seen by neuropsychology for anxiety with depression, appreciate assistance             -antipsychotic agents: N/A  -6/25 consult psychiatry for severe anxiety- no medication changes recommended, outpatient treatment with psychiatry and therapist recommended 5. Neuropsych/cognition: This patient is ?capable of making decisions on her own behalf. 6. Skin/Wound Care: Routine skin checks 7. Fluids/Electrolytes/Nutrition: Routine in and outs with follow-up chemistries  8.  Sarcoidosis/interstitial lung disease/chronic respiratory failure with hypoxia- Was on 4-5L at home- been doing 3-4L here- and concern for hypercarbia.  Continue chronic oxygen  therapy.  Prednisone  as directed, Lasix  20 mg Monday Wednesday Fridays.  Followed by Dr. Selinda Moots pulmonary services [facility]  6/14- some sob this morning but VS ok. ?anxiety component  -she is receiving brovana  and pulmicort  scheduled with albuterol  prn  -added IS/ and FV today  6/15 breathing better today. I asked her if she took prednisone  with hydrocortisone , and she told me she did at one point, but no longer.    -obsv for now  breathing doing ok- at basline, continue to monitor  Discussed Incentive spirometer SpO2: (!) 89 % O2 Flow Rate (L/min): 4 L/min FiO2 (%): 40 % 6/22-4: Remains on 4 L nasal cannula, satting well. 6/25 chest CXR, order mucinex  for 3 days CXR- no acute changes, may be anxiety contributing 6/27 pump function appears stable, continue to monitor  9.  Left femoral head avascular necrosis.Probably has bilaterally, based on chart review  Not a surgical candidate.  Conservative care  6/13- went over with therapy- that her goals  from my point of view is to walk 15-25 ft, but probably not more- will not make AVN worse, but will hurt more-   10.  E. coli UTI as well as chronic urinary incontinence.  Status post 3-day course of IV Rocephin  completed.  Continue Flomax  0.4 mg daily.  Check PVR  6/14-15 pt is voiding continently. No PVR's but I'm not sure they'reneeded right now  6/16 check PVRs, pt reports she doesn't empty completely - do not see any completed yet 6/19 recheck U/A and culture for dysuria, ask nursing completed PVRs  -Start keflex - monitor cultures- pseudomonas- sensitivities pending- Change to Levofloxacin  6/21: cx + pseudomonas; given n.p.o. will switch to IV cefepime  2 g q8h 6/22: Continence improving, continue IV cefepime  for now.  Discussed risks of Foley placement for incontinence given patient discomfort with transferring to bedside commode, she is agreeable to continuing this intermittent straight cathing as needed; husband has been trained on straight cathing in the past and is comfortable doing this at discharge if needed 6/23 continues with intermittent catheterizations, continue current regimen and monitor 6/24 increase flomax  to 0.8mg  6/25 antibiotic discontinued after discussion with pharmacy, has completed 7 days 6/26 discussed avoiding use of purewick in preparation for discharge home and can increase UTI risk 6/27 patient asked seeing for foley, do not think this is necessary at this time and would likely increase risk of UTI, she had a PVR of 168    11.  Normocytic anemia.  Borderline MCV of 96 and RDW of 16.  B12 and folate within normal limits.  Check CBC  6/14- CBC HGB stable at 8.3  6/26 stable hgb at 8.3 12.  Addison's disease.  Continue hydrocortisone  chronically.  Followed by endocrinology services Dr.Gebreselassie Nida 13.  Chronic hypotension.  ProAmatine  5 mg twice daily    02/21/2024    1:50 PM 02/21/2024   12:23 PM 02/21/2024    9:00 AM  Vitals with BMI  Weight 278 lbs 11  oz 288 lbs   BMI 39.99 41.32   Systolic   113  Diastolic   45  Pulse   85  6/29 DBP appears labile, continue to monitor trend  14.  Class III morbid obesity.  BMI 39.75.  Dietary follow-up 15.  Hyperlipidemia.  Crestor  16.  Hypothyroidism.  Synthroid  17.  Chronic constipation/ileus.  Linzess  72 mcg daily, Senokot S2 tablets bedtime, Chronulac  as needed  6/13- LBM 2 days ago- she says doesn't go every day  6/15 I suspect her nausea is related to this. We also started fosamax  yesterday. Discussed how oxycodone  can cause nausea and constipation   -  KUB today reveals moderate stool burden, no ileus   -will order fleets enema   -rx nausea, increase ppi to bid   -increase senna to bid  6/16 reports good results with enema yesterday, continue to monitor   6/17 LBM 6/15 consider additional medication if no BM by then  6/18 Patient reports BM yesterday, continue to monitor   6/20 Sorbitol  ordered this AM- no results will order xray abdomen and Enema- discussed with nursing- had BM with this, add miralax   Small, type I bowel movement 6/20  6/21: KUB with likely ileus--diffuse distention and abdominal pain with slow bowel sounds-- will consult surgery per addendum yesterday.  Started on maintenance IV fluids  6/22: Surgery evaluated yesterday, deemed nonop management, diet as tolerated.  Tolerated liquid diet overnight, will advance to soft today.  DC IV fluids.  Start Reglan  5 mg 3 times daily.  Getting soapsuds enema tonight given no BM for 2 days.  -- Given she is passing flatus, if adequate bowel movement tonight and good control of symptoms in AM, would consider advancement to full  diet.  Will get KUB for a.m.  6/23 continue soft diet today, KUB nonobstructive today, mild stool burden.  Will hold off on increase of pain medications although she does have back pain as do not want to worsen this  6/24 upgrade diet to regular, consider DC reglan , ate 85% meal today, increase miralax  to TID  6/25  recheck abdomina xray assess stool burdon/nausea. Will do bowel prep 1/2 container for constipation  6/26 Xray abdomen no acute changes, sorbitol  ordered for consitpation, she wasn't given bowel prep yesterday?, discussed enema if no results  6/27 LBM today and she had BM yesterday too, continue to monitor 18.  GERD.  Protonix  19. Noncompliance- Per nursing, pt refusing to use TLSO for [facility] transfers, of note- will d/w pt that need to follow recs, or there's more risk of additional fractures.  20. Hypokalemia  6/13- will replete 40 mEq and then recheck Monday  6/23 low normal potassium at 3.5, will add 20 mEq x 1  6/26 K+ repletion ordered  21. Azotemia  -6/18 Cr 1.04 encourage oral fluids-order placed  -6/23 --stable creatinine 1.0, encourage oral intake  -6/26 Cr improved to 0.86   22. Dysphagia  -SLP consult completed 23. Chest pain, appears more epigastic. Suspect this is more likely epigastric/dyspepsia or anxiety than cardiac.Nursing indicates has improved/resolved later in the day  - EKG with few PACs. Ordered troponins   -Maalox ordered   LOS: 15 days A FACE TO FACE EVALUATION WAS PERFORMED  Murray Collier 02/21/2024, 2:34 PM

## 2024-02-21 NOTE — Plan of Care (Signed)
  Problem: Consults Goal: RH SPINAL CORD INJURY PATIENT EDUCATION Description:  See Patient Education module for education specifics.  Outcome: Progressing   Problem: SCI BOWEL ELIMINATION Goal: RH STG MANAGE BOWEL WITH ASSISTANCE Description: STG Manage Bowel with toileting Assistance. Outcome: Progressing Goal: RH STG SCI MANAGE BOWEL WITH MEDICATION WITH ASSISTANCE Description: STG SCI Manage bowel with medication with mod I assistance. Outcome: Progressing   Problem: SCI BLADDER ELIMINATION Goal: RH STG MANAGE BLADDER WITH ASSISTANCE Description: STG Manage Bladder With Assistance Outcome: Progressing Goal: RH STG MANAGE BLADDER WITH MEDICATION WITH ASSISTANCE Description: STG Manage Bladder With Medication With mod I Assistance. Outcome: Progressing   Problem: RH SAFETY Goal: RH STG ADHERE TO SAFETY PRECAUTIONS W/ASSISTANCE/DEVICE Description: STG Adhere to Safety Precautions With cues Assistance/Device. Outcome: Progressing   Problem: RH KNOWLEDGE DEFICIT SCI Goal: RH STG INCREASE KNOWLEDGE OF SELF CARE AFTER SCI Description: Patient and spouse will be able to manage care at discharge using educational resources for medication, skin care, transfers, toileting, dietary modification independently Outcome: Progressing

## 2024-02-21 NOTE — Progress Notes (Addendum)
 Pt transferred to the beside commode to attempt to have a BM. Pt unsuccessful. Bladder scan completed. 269 noted. RN also completed Digital stimulation and disimpacted pt as best as possible. 5 solid balls of hard stool. Pt given sorbitol  as well. Hard stool is is still palpable and needs to be broken up more. Pt given water  and apple juice. This RN will reassess in 1 hour.   Pt had 2 massive Bms with solid stool and mush stool. Pt also have 3 mushy Bms following this. Pt has voided with a PVR of 74ml. Pt reports abd cramping. ABD is distended and hard to touch. Water  intake encouraged. Pt had 6 cups of water  by PO. Pain medication given. Pt is anxious and restless.  BP 116/100. 3L [place] SPO2 at 99%.

## 2024-02-21 NOTE — Progress Notes (Signed)
 Occupational Therapy Session Note  Patient Details  Name: Kathleen [name] MRN: [id number] Date of Birth: 16-Sep-1960  Today's Date: 02/21/2024 OT Individual Time: 0828-0904+1308-1354 OT Individual Time Calculation (min): 36 min  ( 9 mins missed d/t breathing treatment)    Short Term Goals: Week 2:  OT Short Term Goal 1 (Week 2): STG=LTG d/t ELOS  Skilled Therapeutic Interventions/Progress Updates:  Session 1: Pt greeted supine in bed, pt agreeable to OT intervention.     Pt refused wearing TLSO, pt perseverating on feeling like she was having a heart attack and couldn't breathe nurse present during session. Reinforced grounding techniques to reduce anxiety during mobility.   Pt with unrated pain throughout session in back and from nausea. Rest breaks, repositioning and distracted utilized to manage pain.   Transfers/bed mobility/functional mobility:  Pt completed supine>sit with close supervision. Pt completed sit>stand with MINA +2 for safety. Stand pivot to w/c with Rw and CGA +2 for safety. Once pt sat in chair pt immediately worrying about throwing up and having a heart attack. Pt provided with breathing treatment from nurse. During this time provided education on breathing through movement as pt noted to hold her breath during functional tasks and SpO2 begins to drop and pt begins to panic. Did miss 9 for breathing treatment and med pass.   Pt on 4L [place] during session with SpO2 ranging from 95-87%.    ADLs:  UB dressing:donned OH shirt from EOB with set- up assist.  LB dressing: donned pants with reacher with MODA as pt needed to assist to thread R side d/t RLE being asleep. Pt needed assist to pull pants up to waist line in standing.    Ended session with pt seated in w/c with all needs within reach and nurse present.                 Session 2:Pt greeted supine in bed, pt agreeable to OT intervention.    Pt declined wearing TLSO and needed max encouragement to agree to  wear gait belt. Pt with unrated pain throughout session in back and from nausea. Rest breaks, repositioning and distracted utilized to manage pain.   Transfers/bed mobility/functional mobility: pt completed supine>sit with CGA. Pt completed sit>stand from EOB with MIN A +2 for safety. Stand pivot to w/c with Rw and CGA.   Therapeutic activity:  Pt completed various standing balance tasks at hi lo table with a focus on improving confidence with removing one UE at a time off of RW for ADL participation. Pt completed 4 standing activities with pt instructed to use RUE to place/remove pegs in board or reach OH to remove horseshoes from vertical board. Pt completed task with CGA +2 for safety. Pt able to stand for ~ 30 secs each trial. Pt always sits prematurely and declines reaching back when descending into chair. Pt also prefers to pull up on RW despite education.  After completing an effortful task pt perseverates on feeling like she can't breathe. Pt on 4L [place] during session with SpO2 < 88% during session. Continued to educate pt on deep breathing strategies.    ADLs:  LB dressing: donned pants from EOB with MAX A d/t time constraints.       Ended session with pt seated in w/c with all needs within reach and nurse present.    Therapy Documentation Precautions:  Precautions Precautions: Fall Recall of Precautions/Restrictions: Intact Precaution/Restrictions Comments: TLSO brace (when ambulating), pt refused to don at evaluation Required Braces or  Orthoses: Spinal Brace Spinal Brace: Thoracolumbosacral orthotic Restrictions Weight Bearing Restrictions Per Provider Order: No    Therapy/Group: Individual Therapy  Ronal Gift [facility] 02/21/2024, 12:09 PM

## 2024-02-22 LAB — URINALYSIS, W/ REFLEX TO CULTURE (INFECTION SUSPECTED)
Bacteria, UA: NONE SEEN
Bilirubin Urine: NEGATIVE
Glucose, UA: NEGATIVE mg/dL
Hgb urine dipstick: NEGATIVE
Ketones, ur: NEGATIVE mg/dL
Leukocytes,Ua: NEGATIVE
Nitrite: NEGATIVE
Protein, ur: NEGATIVE mg/dL
Specific Gravity, Urine: 1.008 (ref 1.005–1.030)
pH: 6 (ref 5.0–8.0)

## 2024-02-22 MED ORDER — ALENDRONATE SODIUM 70 MG PO TABS: 70.0000 mg | ORAL_TABLET | ORAL | Status: DC

## 2024-02-22 NOTE — Progress Notes (Addendum)
 PROGRESS NOTE   Subjective/Complaints: Pt reports she had a better day yesterday. LBM yesterday. Continues to have back pain.  Had bladder scan of 382 and ICU with 900 mL.     Vital stable    02/22/2024    9:04 AM 02/22/2024    5:53 AM 02/21/2024    8:51 PM  Vitals with BMI  Systolic 113 119 866  Diastolic 2 49 57  Pulse 86 84 88    ROS: Patient denies fever, rash, vomiting, diarrhea, shortness of breath headache  + back pain-continued  + anxiety- continued +Chronic urinary retention + constipation - continued + Abdominal pain/nausea   Objective:   No results found.   Recent Labs    02/20/24 0509  WBC 8.4  HGB 8.3*  HCT 27.8*  PLT 351    Recent Labs    02/20/24 0509 02/21/24 0542  NA 139 138  K 3.2* 3.6  CL 100 98  CO2 36* 32  GLUCOSE 113* 90  BUN 6* 5*  CREATININE 0.86 0.81  CALCIUM  8.2* 8.4*     Intake/Output Summary (Last 24 hours) at 02/22/2024 1253 Last data filed at 02/22/2024 1235 Gross per 24 hour  Intake 836 ml  Output 1486 ml  Net -650 ml         Physical Exam: Vital Signs Blood pressure (!) 113/2, pulse 86, temperature 98.7 F (37.1 C), temperature source Oral, resp. rate 20, height 5' 10 (1.778 m), weight 126.4 kg, last menstrual period 05/17/2016, SpO2 94%.  Constitutional: No apparent distress. Appropriate appearance for age.  Obese.  Laying in bed,appears comfortable in bed  HENT: No JVD. Neck Supple. Trachea midline. Atraumatic, normocephalic. Eyes: PERRLA. EOMI. Visual fields grossly intact.  Cardiovascular: RRR, no murmurs/rub/gallops.  1+ right lower extremity, 1+ left lower extremity edema.  NO chest wall TTP, reports discomfort around epigastric region Respiratory: CTAB. No rales, rhonchi, or wheezing. On 4 L nasal cannula. Abdomen: + bowel sounds, normoactive,  Protuberant, No TTP, without guarding or palpable deformity.   Skin: No breakdown on visible  portion MSK:      No apparent deformity.       Neurologic exam:  Awake, alert, oriented x 4.  No apparent deficits.  Cranial nerve exam 2 through 12 intact.  Intact to light touch throughout. Ue's5-/5 in Deltoids, biceps, triceps; WE 4/5- give way weakness; Grip 4+/5 and FA 4+/5 B/L LE's- 5-/5 throughout B/L   Prior neuro assessment is c/w today's exam 02/22/2024.      Assessment/Plan: 1. Functional deficits which require 3+ hours per day of interdisciplinary therapy in a comprehensive inpatient rehab setting. Physiatrist is providing close team supervision and 24 hour management of active medical problems listed below. Physiatrist and rehab team continue to assess barriers to discharge/monitor patient progress toward functional and medical goals  Care Tool:  Bathing    Body parts bathed by patient: Left arm, Right arm, Abdomen, Chest, Face   Body parts bathed by helper: Front perineal area, Buttocks, Right upper leg, Left upper leg, Right lower leg, Left lower leg     Bathing assist Assist Level: Minimal Assistance - Patient > 75%     Upper Body  Dressing/Undressing Upper body dressing   What is the patient wearing?: Pull over shirt Orthosis activity level: Performed by helper  Upper body assist Assist Level: Set up assist    Lower Body Dressing/Undressing Lower body dressing      What is the patient wearing?: Pants     Lower body assist Assist for lower body dressing: Moderate Assistance - Patient 50 - 74%     Toileting Toileting    Toileting assist Assist for toileting: Minimal Assistance - Patient > 75%     Transfers Chair/bed transfer  Transfers assist     Chair/bed transfer assist level: Supervision/Verbal cueing     Locomotion Ambulation   Ambulation assist      Assist level: Supervision/Verbal cueing Assistive device: Walker-rolling Max distance: 8'   Walk 10 feet activity   Assist  Walk 10 feet activity did not occur: Safety/medical  concerns  Assist level: Contact Guard/Touching assist Assistive device: Walker-rolling   Walk 50 feet activity   Assist Walk 50 feet with 2 turns activity did not occur: Safety/medical concerns  Assist level: Contact Guard/Touching assist Assistive device: Walker-rolling    Walk 150 feet activity   Assist Walk 150 feet activity did not occur: Safety/medical concerns (pt too fatigued, SOB)         Walk 10 feet on uneven surface  activity   Assist Walk 10 feet on uneven surfaces activity did not occur: Safety/medical concerns         Wheelchair     Assist Is the patient using a wheelchair?: Yes Type of Wheelchair: Manual Wheelchair activity did not occur: Safety/medical concerns  Wheelchair assist level: Supervision/Verbal cueing Max wheelchair distance: 140'    Wheelchair 50 feet with 2 turns activity    Assist    Wheelchair 50 feet with 2 turns activity did not occur: Safety/medical concerns   Assist Level: Supervision/Verbal cueing   Wheelchair 150 feet activity     Assist  Wheelchair 150 feet activity did not occur: Safety/medical concerns   Assist Level: Supervision/Verbal cueing   Blood pressure (!) 113/2, pulse 86, temperature 98.7 F (37.1 C), temperature source Oral, resp. rate 20, height 5' 10 (1.778 m), weight 126.4 kg, last menstrual period 05/17/2016, SpO2 94%.  Medical Problem List and Plan: 1. Functional deficits secondary to compression and multiple fractures of spine without SCI-intractable acute on chronic low back pain due to osteoporotic fracture involving L1, L3 and L4/acute left pubic symphysis and right sacral ala fracture.  Patient declined kyphoplasty.  Conservative management with narcotics and TLSO             -patient may  shower             -ELOS/Goals: 12-14 days- supervision to min A             -Continue CIR therapies including PT, OT   - L-spine x-ray worsening compression deformity L1 level  -Exp dc  7/1  -Code status changed to full code after discussion with patient- she requested this be changed   2.  Antithrombotics: -DVT/anticoagulation:  Pharmaceutical: Lovenox .  Check vascular study 6/13- Dopplers (-)             -antiplatelet therapy: N/A 3. Pain Management/chronic pain followed at [facility] pain clinic- usually takes 10 mg 6x/day: Flexeril  5 mg 3 times daily, lidoderm  patch, oxycodone  30 mg every 4 hours as needed upon transfer- attempting, with d/w pt and husband, to add MS Contin  15 mg BID and reduce Oxycodone  to  20 mg q4 hours  6/13- changed to Oxycontin  10 mg BID and continue Oxy 20 mg q4 hours prn  6/16 increase oxycontin  to 15mg  Q12h  6/17 Increase OxyContin  to 20 mg every 12 hours 4. Mood/Behavior/Sleep:  Pt has severe anxiety Cymbalta  60 mg daily, BuSpar  15 mg 3 times daily, Atarax  25 mg 3 times daily as needed             -added Celexa  10 mg daily for anxiety- is better for anxiety than Duloxetine , but need Duloxetine  for pain control  - 6/17 seen by neuropsychology for anxiety with depression, appreciate assistance             -antipsychotic agents: N/A  -6/25 consult psychiatry for severe anxiety- no medication changes recommended, outpatient treatment with psychiatry and therapist recommended 5. Neuropsych/cognition: This patient is ?capable of making decisions on her own behalf. 6. Skin/Wound Care: Routine skin checks 7. Fluids/Electrolytes/Nutrition: Routine in and outs with follow-up chemistries  8.  Sarcoidosis/interstitial lung disease/chronic respiratory failure with hypoxia- Was on 4-5L at home- been doing 3-4L here- and concern for hypercarbia.  Continue chronic oxygen  therapy.  Prednisone  as directed, Lasix  20 mg Monday Wednesday Fridays.  Followed by Dr. Selinda Moots pulmonary services [facility]  6/14- some sob this morning but VS ok. ?anxiety component  -she is receiving brovana  and pulmicort  scheduled with albuterol  prn  -added IS/ and FV today  6/15 breathing  better today. I asked her if she took prednisone  with hydrocortisone , and she told me she did at one point, but no longer.    -obsv for now  breathing doing ok- at basline, continue to monitor  Discussed Incentive spirometer SpO2: 94 % O2 Flow Rate (L/min): 4 L/min FiO2 (%): 40 % 6/22-4: Remains on 4 L nasal cannula, satting well. 6/25 chest CXR, order mucinex  for 3 days CXR- no acute changes, may be anxiety contributing 6/27 pulm function appears stable, continue to monitor  6/28 reviewed most recent x-ray again with patient 9.  Left femoral head avascular necrosis.Probably has bilaterally, based on chart review  Not a surgical candidate.  Conservative care  6/13- went over with therapy- that her goals from my point of view is to walk 15-25 ft, but probably not more- will not make AVN worse, but will hurt more-   10.  E. coli UTI as well as chronic urinary incontinence.  Status post 3-day course of IV Rocephin  completed.  Continue Flomax  0.4 mg daily.  Check PVR  6/14-15 pt is voiding continently. No PVR's but I'm not sure they'reneeded right now  6/16 check PVRs, pt reports she doesn't empty completely - do not see any completed yet 6/19 recheck U/A and culture for dysuria, ask nursing completed PVRs  -Start keflex - monitor cultures- pseudomonas- sensitivities pending- Change to Levofloxacin  6/21: cx + pseudomonas; given n.p.o. will switch to IV cefepime  2 g q8h 6/22: Continence improving, continue IV cefepime  for now.  Discussed risks of Foley placement for incontinence given patient discomfort with transferring to bedside commode, she is agreeable to continuing this intermittent straight cathing as needed; husband has been trained on straight cathing in the past and is comfortable doing this at discharge if needed 6/23 continues with intermittent catheterizations, continue current regimen and monitor 6/24 increase flomax  to 0.8mg  6/25 antibiotic discontinued after discussion with  pharmacy, has completed 7 days 6/26 discussed avoiding use of purewick in preparation for discharge home and can increase UTI risk 6/28 continue PVRs, patient did have 900 mL output  with PVR only 382.  Okay to In-N-Out cath as needed bladder/suprapubic discomfort   U/a for burning with urination ordered  11.  Normocytic anemia.  Borderline MCV of 96 and RDW of 16.  B12 and folate within normal limits.  Check CBC  6/14- CBC HGB stable at 8.3  6/26 stable hgb at 8.3 12.  Addison's disease.  Continue hydrocortisone  chronically.  Followed by endocrinology services Dr.Gebreselassie Nida  - 6/28 family is wondering if patient will be able to come off hydrocortisone .  I think this would be best to discuss with endocrinology outpatient 13.  Chronic hypotension.  ProAmatine  5 mg twice daily    02/22/2024    9:04 AM 02/22/2024    5:53 AM 02/21/2024    8:51 PM  Vitals with BMI  Systolic 113 119 866  Diastolic 2 49 57  Pulse 86 84 88  6/29 Stable continue current regimen and monitor  14.  Class III morbid obesity.  BMI 39.75.  Dietary follow-up 15.  Hyperlipidemia.  Crestor  16.  Hypothyroidism.  Synthroid  17.  Chronic constipation/ileus.  Linzess  72 mcg daily, Senokot S2 tablets bedtime, Chronulac  as needed  6/13- LBM 2 days ago- she says doesn't go every day  6/15 I suspect her nausea is related to this. We also started fosamax  yesterday. Discussed how oxycodone  can cause nausea and constipation   -  KUB today reveals moderate stool burden, no ileus   -will order fleets enema   -rx nausea, increase ppi to bid   -increase senna to bid  6/16 reports good results with enema yesterday, continue to monitor   6/17 LBM 6/15 consider additional medication if no BM by then  6/18 Patient reports BM yesterday, continue to monitor   6/20 Sorbitol  ordered this AM- no results will order xray abdomen and Enema- discussed with nursing- had BM with this, add miralax   Small, type I bowel movement 6/20  6/21:  KUB with likely ileus--diffuse distention and abdominal pain with slow bowel sounds-- will consult surgery per addendum yesterday.  Started on maintenance IV fluids  6/22: Surgery evaluated yesterday, deemed nonop management, diet as tolerated.  Tolerated liquid diet overnight, will advance to soft today.  DC IV fluids.  Start Reglan  5 mg 3 times daily.  Getting soapsuds enema tonight given no BM for 2 days.  -- Given she is passing flatus, if adequate bowel movement tonight and good control of symptoms in AM, would consider advancement to full diet.  Will get KUB for a.m.  6/23 continue soft diet today, KUB nonobstructive today, mild stool burden.  Will hold off on increase of pain medications although she does have back pain as do not want to worsen this  6/24 upgrade diet to regular, consider DC reglan , ate 85% meal today, increase miralax  to TID  6/25 recheck abdomina xray assess stool burdon/nausea. Will do bowel prep 1/2 container for constipation  6/26 Xray abdomen no acute changes, sorbitol  ordered for consitpation, she wasn't given bowel prep yesterday?, discussed enema if no results  6/27 LBM today and she had BM yesterday too, continue to monitor  6/28 several BM yesterday, continue current  18.  GERD.  Protonix  19. Noncompliance- Per nursing, pt refusing to use TLSO for [facility] transfers, of note- will d/w pt that need to follow recs, or there's more risk of additional fractures.  20. Hypokalemia  6/13- will replete 40 mEq and then recheck Monday  6/23 low normal potassium at 3.5, will add 20 mEq x 1  6/26 K+ repletion ordered  6/28 improved to 3.6 yesterday  21. Azotemia  -6/18 Cr 1.04 encourage oral fluids-order placed  -6/23 --stable creatinine 1.0, encourage oral intake  -6/26 Cr improved to 0.86   22. Dysphagia  -SLP consult completed 23. Chest pain, appears more epigastic. Suspect this is more likely epigastric/dyspepsia or anxiety than cardiac.Nursing indicates has  improved/resolved later in the day  - EKG with few PACs. Ordered troponins - not elevated  -Maalox ordered   LOS: 16 days A FACE TO FACE EVALUATION WAS PERFORMED  Murray Collier 02/22/2024, 12:53 PM

## 2024-02-22 NOTE — Progress Notes (Signed)
 Physical Therapy Session Note  Patient Details  Name: Kathleen [name] MRN: [id number] Date of Birth: 04-04-1961  Today's Date: 02/22/2024 PT Individual Time: 0730-0800 PT Individual Time Calculation (min): 30 min   Short Term Goals: Week 2:  PT Short Term Goal 1 (Week 2): Pt will maintain static standing balance up to x3' with LRAD  Skilled Therapeutic Interventions/Progress Updates:    Chart reviewed and pt agreeable to therapy. Pt received semi-reclined in bed with 10/10 c/o pain. Also of note, pt on 4L O2 via [place]. Pt requesting catheterization at start of session, but encouraged by PT and LPN to try [facility]. Pt agreeable. Pt completed SPT to [facility] with minA from husband + close S by PT. Husband demonstrated correct body mechanics for guarding pt during transfer without VC. Pt attempted voiding for 5 mins without success. Pt then returned to bed with minA from husband and S from PT, again with correct body mechanics. LPN noted need for catheterization, but reported need for order to be placed. LPN also noted last documented void as yesterday around 4pm. Pt reported pn as too high to participate until after voiding. PT waited 15 mins in room, with order still pending. Since catheterization expected to take longer than remaining time, PT ended session. Pt agreeable to PT returning later in day if PT schedule allows. Pt missed 30 mins of PT due to need for catheterization. At end of session, pt was left semi-reclined in bed in care of LPN and all needs in reach.     Therapy Documentation Precautions:  Precautions Precautions: Fall Recall of Precautions/Restrictions: Intact Precaution/Restrictions Comments: TLSO brace (when ambulating), pt refused to don at evaluation Required Braces or Orthoses: Spinal Brace Spinal Brace: Thoracolumbosacral orthotic Restrictions Weight Bearing Restrictions Per Provider Order: No General: PT Amount of Missed Time (min): 30 Minutes PT Missed Treatment Reason: Other  (Comment);Pain;Unavailable (Comment) (10/10 pain awaiting pending order for bladder catheterization)    Therapy/Group: Individual Therapy  Warrick KANDICE Raspberry 02/22/2024, 10:58 AM

## 2024-02-22 NOTE — Plan of Care (Signed)
  Problem: Consults Goal: RH SPINAL CORD INJURY PATIENT EDUCATION Description:  See Patient Education module for education specifics.  Outcome: Progressing   Problem: SCI BOWEL ELIMINATION Goal: RH STG MANAGE BOWEL WITH ASSISTANCE Description: STG Manage Bowel with toileting Assistance. Outcome: Progressing Goal: RH STG SCI MANAGE BOWEL WITH MEDICATION WITH ASSISTANCE Description: STG SCI Manage bowel with medication with mod I assistance. Outcome: Progressing Goal: RH STG SCI MANAGE BOWEL PROGRAM W/ASSIST OR AS APPROPRIATE Description: STG SCI Manage bowel program w/assist or as appropriate. Outcome: Progressing   Problem: SCI BLADDER ELIMINATION Goal: RH STG MANAGE BLADDER WITH ASSISTANCE Description: STG Manage Bladder With Assistance Outcome: Progressing Goal: RH STG MANAGE BLADDER WITH MEDICATION WITH ASSISTANCE Description: STG Manage Bladder With Medication With mod I Assistance. Outcome: Progressing Goal: RH STG SCI MANAGE BLADDER PROGRAM W/ASSISTANCE Description: Toileting protocol Outcome: Progressing   Problem: RH SAFETY Goal: RH STG ADHERE TO SAFETY PRECAUTIONS W/ASSISTANCE/DEVICE Description: STG Adhere to Safety Precautions With cues Assistance/Device. Outcome: Progressing

## 2024-02-22 NOTE — Progress Notes (Signed)
 Physical Therapy Session Note  Patient Details  Name: Kathleen [name] MRN: [id number] Date of Birth: 1961/07/13  Today's Date: 02/22/2024 PT Individual Time: 0730-0800 PT Individual Time Calculation (min): 30 min   Short Term Goals: Week 2:  PT Short Term Goal 1 (Week 2): Pt will maintain static standing balance up to x3' with LRAD  Skilled Therapeutic Interventions/Progress Updates:    Chart reviewed and pt agreeable to therapy. Pt received semi-reclined in bed with 10/10 c/o pain. Also of note, pt on 4L O2 via [place]. Pt requesting catheterization at start of session, but encouraged by PT and LPN to try [facility]. Pt agreeable. Pt completed SPT to [facility] with minA from husband + close S by PT. Husband demonstrated correct body mechanics for guarding pt during transfer without VC. Pt attempted voiding for 5 mins without success. Pt then returned to bed with minA from husband and S from PT, again with correct body mechanics. LPN noted need for catheterization, but reported need for order to be placed. LPN also noted last documented void as yesterday around 4pm. Pt reported pn as too high to participate until after voiding. PT waited 15 mins in room, with order still pending. Since catheterization expected to take longer than remaining time, PT ended session. Pt agreeable to PT returning later in day if PT schedule allows. Pt missed 30 mins of PT due to need for catheterization. At end of session, pt was left semi-reclined in bed in care of LPN and all needs in reach.     Therapy Documentation Precautions:  Precautions Precautions: Fall Recall of Precautions/Restrictions: Intact Precaution/Restrictions Comments: TLSO brace (when ambulating), pt refused to don at evaluation Required Braces or Orthoses: Spinal Brace Spinal Brace: Thoracolumbosacral orthotic Restrictions Weight Bearing Restrictions Per Provider Order: No General: PT Amount of Missed Time (min): 30 Minutes PT Missed Treatment Reason: Other  (Comment);Pain;Unavailable (Comment) (10/10 pain awaiting pending order for bladder catheterization)    Therapy/Group: Individual Therapy  Warrick KANDICE Raspberry 02/22/2024, 8:24 AM

## 2024-02-22 NOTE — Progress Notes (Signed)
 Pt had a midline iv (long peripheral iv) placed 02/15/24;  it has no blood return, and the pt is no longer receiving IV medications; suggest removing the iv due to potential risk for infection;  have spoken to RN.

## 2024-02-22 NOTE — Plan of Care (Signed)
  Problem: Consults Goal: RH SPINAL CORD INJURY PATIENT EDUCATION Description:  See Patient Education module for education specifics.  Outcome: Progressing   Problem: SCI BOWEL ELIMINATION Goal: RH STG MANAGE BOWEL WITH ASSISTANCE Description: STG Manage Bowel with toileting Assistance. Outcome: Progressing Goal: RH STG SCI MANAGE BOWEL WITH MEDICATION WITH ASSISTANCE Description: STG SCI Manage bowel with medication with mod I assistance. Outcome: Progressing Goal: RH STG SCI MANAGE BOWEL PROGRAM W/ASSIST OR AS APPROPRIATE Description: STG SCI Manage bowel program w/assist or as appropriate. Outcome: Progressing   Problem: SCI BLADDER ELIMINATION Goal: RH STG MANAGE BLADDER WITH ASSISTANCE Description: STG Manage Bladder With Assistance Outcome: Progressing Goal: RH STG MANAGE BLADDER WITH MEDICATION WITH ASSISTANCE Description: STG Manage Bladder With Medication With mod I Assistance. Outcome: Progressing Goal: RH STG SCI MANAGE BLADDER PROGRAM W/ASSISTANCE Description: Toileting protocol Outcome: Progressing   Problem: RH SAFETY Goal: RH STG ADHERE TO SAFETY PRECAUTIONS W/ASSISTANCE/DEVICE Description: STG Adhere to Safety Precautions With cues Assistance/Device. Outcome: Progressing   Problem: RH KNOWLEDGE DEFICIT SCI Goal: RH STG INCREASE KNOWLEDGE OF SELF CARE AFTER SCI Description: Patient and spouse will be able to manage care at discharge using educational resources for medication, skin care, transfers, toileting, dietary modification independently Outcome: Progressing

## 2024-02-23 NOTE — Progress Notes (Signed)
 Occupational Therapy Session Note  Patient Details  Name: Kathleen [name] MRN: [id number] Date of Birth: [birth year]  Today's Date: 02/23/2024 OT Individual Time: 1010-1050 OT Individual Time Calculation (min): 40 min    Short Term Goals: Week 2:  OT Short Term Goal 1 (Week 2): STG=LTG d/t ELOS  Skilled Therapeutic Interventions/Progress Updates:      Therapy Documentation Precautions:  Precautions Precautions: Fall Recall of Precautions/Restrictions: Intact Precaution/Restrictions Comments: TLSO brace (when ambulating), pt refused to don at evaluation Required Braces or Orthoses: Spinal Brace Spinal Brace: Thoracolumbosacral orthotic Restrictions Weight Bearing Restrictions Per Provider Order: No General:  Pt supine in bed upon OT arrival, agreeable to OT session. Husband present  Pain:  10/10 pain reported in low back, activity, intermittent rest breaks, distractions provided for pain management, pt reports tolerable to proceed.   ADL: OT providing skilled intervention on ADL retraining in order to increase independence with tasks and increase activity tolerance. Pt completed the following tasks at the current level of assist: Bed mobility: SBA supine><EOB with VC with HOB raised Toilet transfer: CGA for RW with VC for slower pace  Toileting: Min A, assistance required in standing to pull down over waist, unable to void, this session, requiring back to bed for cathing   Pt supine in bed with bed alarm activated, 2 bed rails up, call light within reach and 4Ps assessed. Handoff to nsg for cathing. Husband present     Therapy/Group: Individual Therapy  Camie Hoe, OTD, OTR/L 02/23/2024, 5:26 PM

## 2024-02-23 NOTE — Progress Notes (Addendum)
 PROGRESS NOTE   Subjective/Complaints: UA completed yesterday did not indicate UTI.  Had some nausea this morning but has improved.  Reports she did well with therapy today, pt says she walked about 30 feet.  Had a large bowel movement yesterday.   Vital stable    02/23/2024    9:16 AM 02/23/2024    3:36 AM 02/22/2024    8:21 PM  Vitals with BMI  Systolic  117 114  Diastolic  57 62  Pulse 81 91 82    ROS: Patient denies fever, rash, vomiting, diarrhea, shortness of breath headache, chest pain  + back pain-continued  + anxiety- continued +Chronic urinary retention + constipation - continued + Abdominal pain/nausea   Objective:   No results found.   No results for input(s): WBC, HGB, HCT, PLT in the last 72 hours.   Recent Labs    02/21/24 0542  NA 138  K 3.6  CL 98  CO2 32  GLUCOSE 90  BUN 5*  CREATININE 0.81  CALCIUM  8.4*     Intake/Output Summary (Last 24 hours) at 02/23/2024 1205 Last data filed at 02/23/2024 1100 Gross per 24 hour  Intake 714 ml  Output 2098 ml  Net -1384 ml         Physical Exam: Vital Signs Blood pressure (!) 117/57, pulse 81, temperature 98.9 F (37.2 C), temperature source Oral, resp. rate 20, height 5' 10 (1.778 m), weight 126.4 kg, last menstrual period 05/17/2016, SpO2 96%.  Constitutional: No apparent distress. Appropriate appearance for age.  Obese.  Laying in bed HENT: No JVD. Neck Supple. Trachea midline. Atraumatic, normocephalic. Cardiovascular: RRR, no murmurs/rub/gallops. Respiratory: CTAB. No rales, rhonchi, or wheezing. On 4 L nasal cannula. Abdomen: + bowel sounds, normoactive,  Protuberant, No TTP Skin: No breakdown on visible portion MSK:      No apparent deformity.       Neurologic exam:  Awake, alert, oriented x 4.  No apparent deficits.  Cranial nerves II through XII grossly intact intact to light touch throughout. Ue's5-/5 in Deltoids,  biceps, triceps; WE 4/5- give way weakness; Grip 4+/5 and FA 4+/5 B/L LE's- 5-/5 throughout B/L   Prior neuro assessment is c/w today's exam 02/23/2024.      Assessment/Plan: 1. Functional deficits which require 3+ hours per day of interdisciplinary therapy in a comprehensive inpatient rehab setting. Physiatrist is providing close team supervision and 24 hour management of active medical problems listed below. Physiatrist and rehab team continue to assess barriers to discharge/monitor patient progress toward functional and medical goals  Care Tool:  Bathing    Body parts bathed by patient: Left arm, Right arm, Abdomen, Chest, Face   Body parts bathed by helper: Front perineal area, Buttocks, Right upper leg, Left upper leg, Right lower leg, Left lower leg     Bathing assist Assist Level: Minimal Assistance - Patient > 75%     Upper Body Dressing/Undressing Upper body dressing   What is the patient wearing?: Pull over shirt Orthosis activity level: Performed by helper  Upper body assist Assist Level: Set up assist    Lower Body Dressing/Undressing Lower body dressing      What is  the patient wearing?: Pants     Lower body assist Assist for lower body dressing: Moderate Assistance - Patient 50 - 74%     Toileting Toileting    Toileting assist Assist for toileting: Minimal Assistance - Patient > 75%     Transfers Chair/bed transfer  Transfers assist     Chair/bed transfer assist level: Supervision/Verbal cueing     Locomotion Ambulation   Ambulation assist      Assist level: Supervision/Verbal cueing Assistive device: Walker-rolling Max distance: 8'   Walk 10 feet activity   Assist  Walk 10 feet activity did not occur: Safety/medical concerns  Assist level: Contact Guard/Touching assist Assistive device: Walker-rolling   Walk 50 feet activity   Assist Walk 50 feet with 2 turns activity did not occur: Safety/medical concerns  Assist level:  Contact Guard/Touching assist Assistive device: Walker-rolling    Walk 150 feet activity   Assist Walk 150 feet activity did not occur: Safety/medical concerns (pt too fatigued, SOB)         Walk 10 feet on uneven surface  activity   Assist Walk 10 feet on uneven surfaces activity did not occur: Safety/medical concerns         Wheelchair     Assist Is the patient using a wheelchair?: Yes Type of Wheelchair: Manual Wheelchair activity did not occur: Safety/medical concerns  Wheelchair assist level: Supervision/Verbal cueing Max wheelchair distance: 140'    Wheelchair 50 feet with 2 turns activity    Assist    Wheelchair 50 feet with 2 turns activity did not occur: Safety/medical concerns   Assist Level: Supervision/Verbal cueing   Wheelchair 150 feet activity     Assist  Wheelchair 150 feet activity did not occur: Safety/medical concerns   Assist Level: Supervision/Verbal cueing   Blood pressure (!) 117/57, pulse 81, temperature 98.9 F (37.2 C), temperature source Oral, resp. rate 20, height 5' 10 (1.778 m), weight 126.4 kg, last menstrual period 05/17/2016, SpO2 96%.  Medical Problem List and Plan: 1. Functional deficits secondary to compression and multiple fractures of spine without SCI-intractable acute on chronic low back pain due to osteoporotic fracture involving L1, L3 and L4/acute left pubic symphysis and right sacral ala fracture.  Patient declined kyphoplasty.  Conservative management with narcotics and TLSO             -patient may  shower             -ELOS/Goals: 12-14 days- supervision to min A             -Continue CIR therapies including PT, OT   - L-spine x-ray worsening compression deformity L1 level  -Exp dc 7/1  -Code status changed to full code after discussion with patient- she requested this be changed   2.  Antithrombotics: -DVT/anticoagulation:  Pharmaceutical: Lovenox .  Check vascular study 6/13- Dopplers (-)              -antiplatelet therapy: N/A 3. Pain Management/chronic pain followed at [facility] pain clinic- usually takes 10 mg 6x/day: Flexeril  5 mg 3 times daily, lidoderm  patch, oxycodone  30 mg every 4 hours as needed upon transfer- attempting, with d/w pt and husband, to add MS Contin  15 mg BID and reduce Oxycodone  to 20 mg q4 hours  6/13- changed to Oxycontin  10 mg BID and continue Oxy 20 mg q4 hours prn  6/16 increase oxycontin  to 15mg  Q12h  6/17 Increase OxyContin  to 20 mg every 12 hours 4. Mood/Behavior/Sleep:  Pt has severe anxiety Cymbalta   60 mg daily, BuSpar  15 mg 3 times daily, Atarax  25 mg 3 times daily as needed             -added Celexa  10 mg daily for anxiety- is better for anxiety than Duloxetine , but need Duloxetine  for pain control  - 6/17 seen by neuropsychology for anxiety with depression, appreciate assistance             -antipsychotic agents: N/A  -6/25 consult psychiatry for severe anxiety- no medication changes recommended, outpatient treatment with psychiatry and therapist recommended -reviewed psychiatry recommendations with patient 5. Neuropsych/cognition: This patient is ?capable of making decisions on her own behalf. 6. Skin/Wound Care: Routine skin checks 7. Fluids/Electrolytes/Nutrition: Routine in and outs with follow-up chemistries  8.  Sarcoidosis/interstitial lung disease/chronic respiratory failure with hypoxia- Was on 4-5L at home- been doing 3-4L here- and concern for hypercarbia.  Continue chronic oxygen  therapy.  Prednisone  as directed, Lasix  20 mg Monday Wednesday Fridays.  Followed by Dr. Selinda Moots pulmonary services [facility]  6/14- some sob this morning but VS ok. ?anxiety component  -she is receiving brovana  and pulmicort  scheduled with albuterol  prn  -added IS/ and FV today  6/15 breathing better today. I asked her if she took prednisone  with hydrocortisone , and she told me she did at one point, but no longer.    -obsv for now  breathing doing ok- at basline,  continue to monitor  Discussed Incentive spirometer SpO2: 96 % O2 Flow Rate (L/min): 4 L/min FiO2 (%): 40 % 6/22-4: Remains on 4 L nasal cannula, satting well. 6/25 chest CXR, order mucinex  for 3 days CXR- no acute changes, may be anxiety contributing 6/28 reviewed most recent x-ray again with patient 6/29 pulmonary function appears stable, continue to monitor 9.  Left femoral head avascular necrosis.Probably has bilaterally, based on chart review  Not a surgical candidate.  Conservative care  6/13- went over with therapy- that her goals from my point of view is to walk 15-25 ft, but probably not more- will not make AVN worse, but will hurt more-   10.  E. coli UTI as well as chronic urinary incontinence.  Status post 3-day course of IV Rocephin  completed.  Continue Flomax  0.4 mg daily.  Check PVR  6/14-15 pt is voiding continently. No PVR's but I'm not sure they'reneeded right now  6/16 check PVRs, pt reports she doesn't empty completely - do not see any completed yet 6/19 recheck U/A and culture for dysuria, ask nursing completed PVRs  -Start keflex - monitor cultures- pseudomonas- sensitivities pending- Change to Levofloxacin  6/21: cx + pseudomonas; given n.p.o. will switch to IV cefepime  2 g q8h 6/22: Continence improving, continue IV cefepime  for now.  Discussed risks of Foley placement for incontinence given patient discomfort with transferring to bedside commode, she is agreeable to continuing this intermittent straight cathing as needed; husband has been trained on straight cathing in the past and is comfortable doing this at discharge if needed 6/23 continues with intermittent catheterizations, continue current regimen and monitor 6/24 increase flomax  to 0.8mg  6/25 antibiotic discontinued after discussion with pharmacy, has completed 7 days 6/26 discussed avoiding use of purewick in preparation for discharge home and can increase UTI risk 6/28 continue PVRs, patient did have 900 mL  output with PVR only 382.  Okay to In-N-Out cath as needed bladder/suprapubic discomfort.  Husband has been doing In-N-Out cath before admission 6/29 UA completed yesterday does not indicate infection  11.  Normocytic anemia.  Borderline MCV of 96 and  RDW of 16.  B12 and folate within normal limits.  Check CBC  6/14- CBC HGB stable at 8.3  6/26 stable hgb at 8.3  Recheck labs tomorrow 12.  Addison's disease.  Continue hydroco rtisone chronically.  Followed by endocrinology services Dr.Gebreselassie Nida  - 6/28 family is wondering if patient will be able to come off hydrocortisone .  I think this would be best to discuss with endocrinology outpatient 13.  Chronic hypotension.  ProAmatine  5 mg twice daily    02/23/2024    9:16 AM 02/23/2024    3:36 AM 02/22/2024    8:21 PM  Vitals with BMI  Systolic  117 114  Diastolic  57 62  Pulse 81 91 82  6/29 controlled continue to monitor  14.  Class III morbid obesity.  BMI 39.75.  Dietary follow-up 15.  Hyperlipidemia.  Crestor  16.  Hypothyroidism.  Synthroid  17.  Chronic constipation/ileus.  Linzess  72 mcg daily, Senokot S2 tablets bedtime, Chronulac  as needed  6/13- LBM 2 days ago- she says doesn't go every day  6/15 I suspect her nausea is related to this. We also started fosamax  yesterday. Discussed how oxycodone  can cause nausea and constipation   -  KUB today reveals moderate stool burden, no ileus   -will order fleets enema   -rx nausea, increase ppi to bid   -increase senna to bid  6/16 reports good results with enema yesterday, continue to monitor   6/17 LBM 6/15 consider additional medication if no BM by then  6/18 Patient reports BM yesterday, continue to monitor   6/20 Sorbitol  ordered this AM- no results will order xray abdomen and Enema- discussed with nursing- had BM with this, add miralax   Small, type I bowel movement 6/20  6/21: KUB with likely ileus--diffuse distention and abdominal pain with slow bowel sounds-- will consult  surgery per addendum yesterday.  Started on maintenance IV fluids  6/22: Surgery evaluated yesterday, deemed nonop management, diet as tolerated.  Tolerated liquid diet overnight, will advance to soft today.  DC IV fluids.  Start Reglan  5 mg 3 times daily.  Getting soapsuds enema tonight given no BM for 2 days.  -- Given she is passing flatus, if adequate bowel movement tonight and good control of symptoms in AM, would consider advancement to full diet.  Will get KUB for a.m.  6/23 continue soft diet today, KUB nonobstructive today, mild stool burden.  Will hold off on increase of pain medications although she does have back pain as do not want to worsen this  6/24 upgrade diet to regular, consider DC reglan , ate 85% meal today, increase miralax  to TID  6/25 recheck abdomina xray assess stool burdon/nausea. Will do bowel prep 1/2 container for constipation  6/26 Xray abdomen no acute changes, sorbitol  ordered for consitpation, she wasn't given bowel prep yesterday?, discussed enema if no results  6/27 LBM today and she had BM yesterday too, continue to monitor  6/28 several BM yesterday, continue current   Reglan  Discontinued  6/29 large BM yesterday, continue current regimen 18.  GERD.  Protonix  19. Noncompliance- Per nursing, pt refusing to use TLSO for [facility] transfers, of note- will d/w pt that need to follow recs, or there's more risk of additional fractures.  20. Hypokalemia  6/13- will replete 40 mEq and then recheck Monday  6/23 low normal potassium at 3.5, will add 20 mEq x 1  6/26 K+ repletion ordered  6/28 improved to 3.6 yesterday  21. Azotemia  -6/18 Cr  1.04 encourage oral fluids-order placed  -6/23 --stable creatinine 1.0, encourage oral intake  -6/26 Cr improved to 0.86   BMP tomorrow   22. Dysphagia  -SLP consult completed  23. Chest pain, appears more epigastic. Suspect this is more likely epigastric/dyspepsia or anxiety than cardiac.Nursing indicates has  improved/resolved later in the day  - EKG with few PACs. Ordered troponins - not elevated  -Maalox ordered   LOS: 17 days A FACE TO FACE EVALUATION WAS PERFORMED  Murray Collier 02/23/2024, 12:05 PM

## 2024-02-23 NOTE — Plan of Care (Signed)
  Problem: Consults Goal: RH SPINAL CORD INJURY PATIENT EDUCATION Description:  See Patient Education module for education specifics.  Outcome: Progressing   Problem: SCI BOWEL ELIMINATION Goal: RH STG MANAGE BOWEL WITH ASSISTANCE Description: STG Manage Bowel with toileting Assistance. Outcome: Progressing Goal: RH STG SCI MANAGE BOWEL WITH MEDICATION WITH ASSISTANCE Description: STG SCI Manage bowel with medication with mod I assistance. Outcome: Progressing Goal: RH STG SCI MANAGE BOWEL PROGRAM W/ASSIST OR AS APPROPRIATE Description: STG SCI Manage bowel program w/assist or as appropriate. Outcome: Progressing   Problem: SCI BLADDER ELIMINATION Goal: RH STG MANAGE BLADDER WITH ASSISTANCE Description: STG Manage Bladder With Assistance Outcome: Progressing Goal: RH STG MANAGE BLADDER WITH MEDICATION WITH ASSISTANCE Description: STG Manage Bladder With Medication With mod I Assistance. Outcome: Progressing Goal: RH STG SCI MANAGE BLADDER PROGRAM W/ASSISTANCE Description: Toileting protocol Outcome: Progressing   Problem: RH KNOWLEDGE DEFICIT SCI Goal: RH STG INCREASE KNOWLEDGE OF SELF CARE AFTER SCI Description: Patient and spouse will be able to manage care at discharge using educational resources for medication, skin care, transfers, toileting, dietary modification independently Outcome: Progressing   Problem: RH SAFETY Goal: RH STG ADHERE TO SAFETY PRECAUTIONS W/ASSISTANCE/DEVICE Description: STG Adhere to Safety Precautions With cues Assistance/Device. Outcome: Progressing

## 2024-02-23 NOTE — Plan of Care (Signed)
  Problem: Consults Goal: RH SPINAL CORD INJURY PATIENT EDUCATION Description:  See Patient Education module for education specifics.  Outcome: Progressing   Problem: SCI BOWEL ELIMINATION Goal: RH STG MANAGE BOWEL WITH ASSISTANCE Description: STG Manage Bowel with toileting Assistance. Outcome: Progressing Goal: RH STG SCI MANAGE BOWEL WITH MEDICATION WITH ASSISTANCE Description: STG SCI Manage bowel with medication with mod I assistance. Outcome: Progressing Goal: RH STG SCI MANAGE BOWEL PROGRAM W/ASSIST OR AS APPROPRIATE Description: STG SCI Manage bowel program w/assist or as appropriate. Outcome: Progressing   Problem: SCI BLADDER ELIMINATION Goal: RH STG MANAGE BLADDER WITH ASSISTANCE Description: STG Manage Bladder With Assistance Outcome: Progressing Goal: RH STG MANAGE BLADDER WITH MEDICATION WITH ASSISTANCE Description: STG Manage Bladder With Medication With mod I Assistance. Outcome: Progressing Goal: RH STG SCI MANAGE BLADDER PROGRAM W/ASSISTANCE Description: Toileting protocol Outcome: Progressing   Problem: RH SAFETY Goal: RH STG ADHERE TO SAFETY PRECAUTIONS W/ASSISTANCE/DEVICE Description: STG Adhere to Safety Precautions With cues Assistance/Device. Outcome: Progressing   Problem: RH KNOWLEDGE DEFICIT SCI Goal: RH STG INCREASE KNOWLEDGE OF SELF CARE AFTER SCI Description: Patient and spouse will be able to manage care at discharge using educational resources for medication, skin care, transfers, toileting, dietary modification independently Outcome: Progressing

## 2024-02-23 NOTE — Progress Notes (Signed)
 Physical Therapy Session Note  Patient Details  Name: Kathleen [name] MRN: [id number] Date of Birth: 1961/05/23  Today's Date: 02/23/2024 PT Individual Time: 0730-0840 PT Individual Time Calculation (min): 70 min   Short Term Goals: Week 2:  PT Short Term Goal 1 (Week 2): Pt will maintain static standing balance up to x3' with LRAD  Skilled Therapeutic Interventions/Progress Updates:    Chart reviewed and pt agreeable to therapy. Pt received semi-reclined in bed with 8/10 c/o pain in back. Also of note, pt on 4L O2 via . Pt refusing TLSO at this time despite education on importance. Pt reports not using it in other PT sessions and that TLSO is causing panic attacks 2/2 claustrophobia. Session focused on functional transfers and amb to promote home mobility. Pt initiated session with trasnfer to EOB using S + elevated HOB consistent with home set up. Pt then completed SPT to [facility] using CGA + elevated bed. Pt reports seated surfaces at home are also elevated. Pt stood from [facility] with CGA and required maxA for pericare. Pt then taken to therapy gym for time. In gym, pt required modA +2 for sit to stand from [facility] 2/2 it beaing a lower seated surface. Pt reports surfaces at home are elevated. For sit to stand, pt given VC for technique, which pt correctly utilized. Pt noted to need +2 assist for power up to stand. Pt then completed SPT using CGA + RW. Pt completed amb practice of 47ft using CGA + RW + elevated seat to stand and CGA + RW + WC follow for amb. Pt reported nausea after amb practice. Pt returned to room and completed sit to stand practice from [facility] using minA + RW + one hand on bedrail to support powerup. Pt returned to bed using minA for leg management. In bed, pt completed 1x6 glute raises with partial ability to clear glutes from bed. Session education emphasized need for hip and BLE strengthening to support power up for standing. At end of session, pt was left semi-reclined in bed with alarm  engaged, nurse call bell and all needs in reach.     Therapy Documentation Precautions:  Precautions Precautions: Fall Recall of Precautions/Restrictions: Intact Precaution/Restrictions Comments: TLSO brace (when ambulating), pt refused to don at evaluation Required Braces or Orthoses: Spinal Brace Spinal Brace: Thoracolumbosacral orthotic Restrictions Weight Bearing Restrictions Per Provider Order: No General:     Therapy/Group: Individual Therapy  Leovardo Thoman G Mariko Nowakowski, PT, DPT 02/23/2024, 12:30 PM

## 2024-02-24 ENCOUNTER — Other Ambulatory Visit (HOSPITAL_COMMUNITY): Payer: Self-pay

## 2024-02-24 ENCOUNTER — Telehealth (HOSPITAL_COMMUNITY): Payer: Self-pay | Admitting: Pharmacy Technician

## 2024-02-24 LAB — CBC
HCT: 30.8 % — ABNORMAL LOW (ref 36.0–46.0)
Hemoglobin: 8.7 g/dL — ABNORMAL LOW (ref 12.0–15.0)
MCH: 28 pg (ref 26.0–34.0)
MCHC: 28.2 g/dL — ABNORMAL LOW (ref 30.0–36.0)
MCV: 99 fL (ref 80.0–100.0)
Platelets: 331 10*3/uL (ref 150–400)
RBC: 3.11 MIL/uL — ABNORMAL LOW (ref 3.87–5.11)
RDW: 15.9 % — ABNORMAL HIGH (ref 11.5–15.5)
WBC: 7.5 10*3/uL (ref 4.0–10.5)
nRBC: 0.3 % — ABNORMAL HIGH (ref 0.0–0.2)

## 2024-02-24 LAB — BASIC METABOLIC PANEL WITH GFR
Anion gap: 10 (ref 5–15)
BUN: <5 mg/dL — ABNORMAL LOW (ref 8–23)
CO2: 30 mmol/L (ref 22–32)
Calcium: 8.3 mg/dL — ABNORMAL LOW (ref 8.9–10.3)
Chloride: 100 mmol/L (ref 98–111)
Creatinine, Ser: 0.74 mg/dL (ref 0.44–1.00)
GFR, Estimated: >60 mL/min
Glucose, Bld: 104 mg/dL — ABNORMAL HIGH (ref 70–99)
Potassium: 4 mmol/L (ref 3.5–5.1)
Sodium: 140 mmol/L (ref 135–145)

## 2024-02-24 MED ORDER — FERROUS SULFATE 325 (65 FE) MG PO TABS
325.0000 mg | ORAL_TABLET | ORAL | 0 refills | Status: DC
  Filled 2024-02-24: qty 4, 28d supply, fill #0

## 2024-02-24 MED ORDER — HYDROXYZINE HCL 25 MG PO TABS
50.0000 mg | ORAL_TABLET | Freq: Three times a day (TID) | ORAL | Status: DC | PRN
  Administered 2024-02-24 (×2): 50 mg via ORAL
  Filled 2024-02-24 (×2): qty 2

## 2024-02-24 MED ORDER — LIDOCAINE 5 % EX PTCH
1.0000 | MEDICATED_PATCH | CUTANEOUS | 0 refills | Status: DC
  Filled 2024-02-24: qty 30, 30d supply, fill #0

## 2024-02-24 MED ORDER — OXYCODONE HCL 10 MG PO TABS
20.0000 mg | ORAL_TABLET | ORAL | 0 refills | Status: DC | PRN
  Filled 2024-02-24: qty 60, 5d supply, fill #0

## 2024-02-24 MED ORDER — VITAMIN D (ERGOCALCIFEROL) 1.25 MG (50000 UNIT) PO CAPS
50000.0000 [IU] | ORAL_CAPSULE | ORAL | 0 refills | Status: AC
  Filled 2024-02-24: qty 5, 35d supply, fill #0

## 2024-02-24 MED ORDER — MIDODRINE HCL 5 MG PO TABS
5.0000 mg | ORAL_TABLET | Freq: Two times a day (BID) | ORAL | 0 refills | Status: DC
  Filled 2024-02-24: qty 60, 30d supply, fill #0

## 2024-02-24 MED ORDER — CYCLOBENZAPRINE HCL 5 MG PO TABS
5.0000 mg | ORAL_TABLET | Freq: Three times a day (TID) | ORAL | 0 refills | Status: DC
  Filled 2024-02-24: qty 90, 30d supply, fill #0

## 2024-02-24 MED ORDER — BUSPIRONE HCL 15 MG PO TABS
15.0000 mg | ORAL_TABLET | Freq: Three times a day (TID) | ORAL | 0 refills | Status: DC
  Filled 2024-02-24: qty 90, 30d supply, fill #0

## 2024-02-24 MED ORDER — MORPHINE SULFATE ER 30 MG PO TBCR
30.0000 mg | EXTENDED_RELEASE_TABLET | Freq: Two times a day (BID) | ORAL | 0 refills | Status: DC
  Filled 2024-02-24: qty 28, 14d supply, fill #0

## 2024-02-24 MED ORDER — PANTOPRAZOLE SODIUM 40 MG PO TBEC
40.0000 mg | DELAYED_RELEASE_TABLET | Freq: Two times a day (BID) | ORAL | 0 refills | Status: DC
  Filled 2024-02-24: qty 60, 30d supply, fill #0

## 2024-02-24 MED ORDER — HYDROXYZINE HCL 25 MG PO TABS
25.0000 mg | ORAL_TABLET | Freq: Three times a day (TID) | ORAL | 0 refills | Status: DC | PRN
  Filled 2024-02-24: qty 10, 4d supply, fill #0

## 2024-02-24 MED ORDER — LEVOTHYROXINE SODIUM 50 MCG PO TABS
50.0000 ug | ORAL_TABLET | Freq: Every day | ORAL | 0 refills | Status: DC
  Filled 2024-02-24: qty 30, 30d supply, fill #0

## 2024-02-24 MED ORDER — LINACLOTIDE 72 MCG PO CAPS
72.0000 ug | ORAL_CAPSULE | Freq: Every day | ORAL | 0 refills | Status: DC
  Filled 2024-02-24: qty 30, 30d supply, fill #0

## 2024-02-24 MED ORDER — FOLIC ACID 1 MG PO TABS
1.0000 mg | ORAL_TABLET | Freq: Every day | ORAL | 0 refills | Status: DC
  Filled 2024-02-24: qty 30, 30d supply, fill #0

## 2024-02-24 MED ORDER — ALENDRONATE SODIUM 70 MG PO TABS
70.0000 mg | ORAL_TABLET | ORAL | 0 refills | Status: DC
  Filled 2024-02-24: qty 4, 28d supply, fill #0

## 2024-02-24 MED ORDER — DULOXETINE HCL 60 MG PO CPEP
60.0000 mg | ORAL_CAPSULE | Freq: Every day | ORAL | 0 refills | Status: DC
  Filled 2024-02-24: qty 30, 30d supply, fill #0

## 2024-02-24 MED ORDER — ROSUVASTATIN CALCIUM 20 MG PO TABS
20.0000 mg | ORAL_TABLET | Freq: Every day | ORAL | 0 refills | Status: DC
  Filled 2024-02-24: qty 30, 30d supply, fill #0

## 2024-02-24 MED ORDER — CITALOPRAM HYDROBROMIDE 10 MG PO TABS
10.0000 mg | ORAL_TABLET | Freq: Every day | ORAL | 0 refills | Status: DC
  Filled 2024-02-24: qty 30, 30d supply, fill #0

## 2024-02-24 MED ORDER — OXYCODONE HCL ER 20 MG PO T12A
20.0000 mg | EXTENDED_RELEASE_TABLET | Freq: Two times a day (BID) | ORAL | 0 refills | Status: DC
  Filled 2024-02-24: qty 40, 20d supply, fill #0

## 2024-02-24 MED ORDER — OXYCODONE HCL ER 15 MG PO T12A
15.0000 mg | EXTENDED_RELEASE_TABLET | Freq: Two times a day (BID) | ORAL | Status: DC
  Administered 2024-02-24 – 2024-02-25 (×2): 15 mg via ORAL
  Filled 2024-02-24 (×2): qty 1

## 2024-02-24 MED ORDER — TAMSULOSIN HCL 0.4 MG PO CAPS
0.8000 mg | ORAL_CAPSULE | Freq: Every day | ORAL | 0 refills | Status: DC
  Filled 2024-02-24: qty 60, 30d supply, fill #0

## 2024-02-24 MED ORDER — REVEFENACIN 175 MCG/3ML IN SOLN
175.0000 ug | Freq: Every day | RESPIRATORY_TRACT | 0 refills | Status: AC
  Filled 2024-02-24: qty 90, 30d supply, fill #0

## 2024-02-24 MED ORDER — FUROSEMIDE 20 MG PO TABS
20.0000 mg | ORAL_TABLET | ORAL | 0 refills | Status: DC
  Filled 2024-02-24: qty 30, 70d supply, fill #0

## 2024-02-24 MED ORDER — TOPIRAMATE 25 MG PO TABS
25.0000 mg | ORAL_TABLET | Freq: Every day | ORAL | Status: DC
  Administered 2024-02-24: 25 mg via ORAL
  Filled 2024-02-24: qty 1

## 2024-02-24 NOTE — Progress Notes (Signed)
 Patient has refused to take medications at this time. Nurse will try again later. Kathleen LITTIE Morones, RN

## 2024-02-24 NOTE — Progress Notes (Signed)
 Physical Therapy Discharge Summary  Patient Details  Name: Kathleen [name] MRN: [id number] Date of Birth: 03/31/1961  Date of Discharge from PT service:February 24, 2024  Today's Date: 02/24/2024 PT Individual Time: 1300-1400 PT Individual Time Calculation (min): 60 min    Patient has met {NUMBERS 0-12:18577} of {NUMBERS 0-12:18577} long term goals due to improved activity tolerance, improved balance, improved postural control, increased strength, and ability to compensate for deficits.  Patient to discharge at an ambulatory level Supervision.   Patient's care partner is independent to provide the necessary physical assistance at discharge.  Reasons goals not met: Pt suffers from high anxiety, pain, generalized weakness, poor endurance, SOB (oxygen  dependent)  Session 1: Pt c/o of feeling ill, vomiting earlier, RN present for medications, pt requesting to use BSC. Missing tx session time of 40 minutes. PT returned and pt willing to participate with therapy. Pt wheeled to gym to attempt gait tx, however, pt reporting not feeling well, but willing to attempt car transfers for discharge tomorrow.  Pt wheeled to ortho gym and performed w/c<->car transfers with min A using bars to help pull self from w/c. Pt able to transfer from car->w/c with supervision.  Pt reports confidence she will be able to get in/out of her car tomorrow just fine.  Pt wheeled herself approx ~138ft with UE/LE assist with multiple rest break due to fatigue, pt c/o handing panic attacks. Pt was wheeled back to room in w/c, back into bed with x2 assist to get to [facility], all items within reach.   Session 2: Pt in bed when PT arrived. Pt willing to participate with PT. Pt wheeled to day room gym to conserve energy.  Pt ambulating with RW x30' with rest, second attempt x15' due to fatigue and requesting to return to room. Pt wheeled to room, encouraged pt to sit in w/c, however, pt refused and transferred into bed. Pt did assist PT with moving  to [facility] using UE/LE today.  Pt had all items within reach. PT discussed discharge plans, importance of continuing with HH PT, answered any questions for pt.   Recommendation:  Patient will benefit from ongoing skilled PT services in home health setting to continue to advance safe functional mobility, address ongoing impairments in endurance, transfers, mobility, short distance ambulation, and minimize fall risk.  Equipment: None. Pt has all DME at home.   Reasons for discharge: discharge from hospital  Patient/family agrees with progress made and goals achieved: Yes  PT Discharge Precautions/Restrictions Precautions Precautions: Fall Recall of Precautions/Restrictions: Intact Restrictions Weight Bearing Restrictions Per Provider Order: No  Pain Pain Assessment Pain Scale: 0-10 Pain Score: 10-Worst pain ever Pain Location: Back Pain Intervention(s): Medication (See eMAR) Pain Interference Pain Interference Pain Effect on Sleep: 4. Almost constantly Pain Interference with Therapy Activities: 2. Occasionally Pain Interference with Day-to-Day Activities: 4. Almost constantly Vision/Perception     Cognition Overall Cognitive Status: Within Functional Limits for tasks assessed Arousal/Alertness: Awake/alert Orientation Level: Oriented X4 Sensation Sensation Light Touch: Appears Intact Hot/Cold: Appears Intact Proprioception: Appears Intact Stereognosis: Not tested Coordination Gross Motor Movements are Fluid and Coordinated: No Fine Motor Movements are Fluid and Coordinated: No Motor  Motor Motor: Within Functional Limits  Mobility Bed Mobility Bed Mobility: Rolling Right;Rolling Left;Supine to Sit;Sitting - Scoot to Edge of Bed;Sit to Supine;Sit to Sidelying Right;Scooting to [facility];Right Sidelying to Sit Rolling Right: Independent with assistive device Rolling Left: Independent with assistive device Right Sidelying to Sit: Independent with assistive device Supine to  Sit: Independent  with assistive device Sitting - Scoot to Edge of Bed: Independent with assistive device Sit to Supine: Independent with assistive device Sit to Sidelying Right: Independent with assistive device Scooting to HOB: Moderate Assistance - Patient 50-74% Transfers Transfers: Sit to Stand;Stand to Sit Sit to Stand: Contact Guard/Touching assist Stand to Sit: Supervision/Verbal cueing Transfer (Assistive device): Rolling walker Locomotion  Gait Ambulation: Yes Gait Assistance: Independent with assistive device Gait Distance (Feet): 30 Feet Assistive device: Rolling walker Gait Assistance Details: Verbal cues for precautions/safety Gait Gait: Yes Gait Pattern: Impaired Gait Pattern: Decreased step length - left;Left foot flat;Right foot flat Gait velocity: impulsive at times High Level Ambulation High Level Ambulation: Sudden stops Sudden Stops: due to anxiety/fatigue Stairs / Additional Locomotion Stairs: No Pick up small object from the floor (from standing position) activity did not occur:  (poor endurance, high anxiety) Pick up small object from the floor assist level: Supervision/Verbal cueing Pick up small object from the floor assistive device: with Chief Operating Officer Mobility: Yes Wheelchair Assistance: Independent with assistive device Wheelchair Propulsion: Both upper extremities;Both lower extermities Wheelchair Parts Management: Independent  Trunk/Postural Assessment  Lumbar AROM Overall Lumbar AROM: Due to pain Lumbar Strength Overall Lumbar Strength: Due to pain Postural Control Postural Control: Within Functional Limits Trunk Control: pt able to sit EOB with UE support >5'  Balance Balance Balance Assessed: Yes Static Sitting Balance Static Sitting - Balance Support: Feet supported;Bilateral upper extremity supported Static Sitting - Level of Assistance: 6: Modified independent (Device/Increase time) Dynamic Sitting  Balance Dynamic Sitting - Balance Support: Feet supported;Right upper extremity supported;Left upper extremity supported Dynamic Sitting - Level of Assistance: 6: Modified independent (Device/Increase time) Dynamic Sitting Balance - Compensations: UE support needed due to back pain Dynamic Sitting - Balance Activities: Forward lean/weight shifting;Reaching for objects Sitting balance - Comments: sitting EOB Static Standing Balance Static Standing - Balance Support: During functional activity;Bilateral upper extremity supported Static Standing - Level of Assistance: 5: Stand by assistance Extremity Assessment      RLE Assessment RLE Assessment: Exceptions to [facility] RLE Strength RLE Overall Strength: Deficits Right Hip Flexion: 4/5 Right Hip Extension: 4/5 Right Hip ABduction: 4/5 Right Hip ADduction: 4/5 Right Knee Flexion: 4-/5 Right Knee Extension: 4-/5 Right Ankle Dorsiflexion: 4/5 Right Ankle Plantar Flexion: 4/5 LLE Assessment LLE Assessment: Exceptions to [facility] LLE Strength LLE Overall Strength: Deficits Left Hip Flexion: 4-/5 Left Hip Extension: 4-/5 Left Hip ABduction: 4-/5 Left Hip ADduction: 4-/5 Left Knee Flexion: 4-/5 Left Knee Extension: 4-/5 Left Ankle Dorsiflexion: 4-/5 Left Ankle Plantar Flexion: 4-/5   Arland RAMAN Tamalyn Wadsworth 02/24/2024, 4:21 PM

## 2024-02-24 NOTE — Progress Notes (Signed)
 PROGRESS NOTE   Subjective/Complaints: + anxiety, increased hydroxyzine  BP is soft, medications reviewed and not on any anti-hypertensives, is on flomax  0.8mg , discussed with nursing current urination   Vital stable    02/24/2024    5:35 AM 02/23/2024    7:22 PM 02/23/2024    2:37 PM  Vitals with BMI  Systolic 114 105 890  Diastolic 45 56 64  Pulse 82 77 81    ROS: Patient denies fever, rash, vomiting, diarrhea, shortness of breath headache, chest pain  + back pain-continued  + anxiety- continues +Chronic urinary retention + constipation - continued + Abdominal pain/nausea   Objective:   No results found.   Recent Labs    02/24/24 0520  WBC 7.5  HGB 8.7*  HCT 30.8*  PLT 331     Recent Labs    02/24/24 0520  NA 140  K 4.0  CL 100  CO2 30  GLUCOSE 104*  BUN <5*  CREATININE 0.74  CALCIUM  8.3*     Intake/Output Summary (Last 24 hours) at 02/24/2024 1259 Last data filed at 02/24/2024 1059 Gross per 24 hour  Intake 840 ml  Output 1300 ml  Net -460 ml         Physical Exam: Vital Signs Blood pressure (!) 114/45, pulse 82, temperature 98.1 F (36.7 C), temperature source Oral, resp. rate 18, height 5' 10 (1.778 m), weight 126.4 kg, last menstrual period 05/17/2016, SpO2 98%.  Constitutional: No apparent distress. Appropriate appearance for age.  Obese.  Laying in bed HENT: No JVD. Neck Supple. Trachea midline. Atraumatic, normocephalic. Cardiovascular: RRR, no murmurs/rub/gallops. Respiratory: CTAB. No rales, rhonchi, or wheezing. On 4 L nasal cannula. Abdomen: + bowel sounds, normoactive,  Protuberant, No TTP Skin: No breakdown on visible portion MSK:      No apparent deformity.       Neurologic exam:  Awake, alert, oriented x 4.  No apparent deficits.  Cranial nerves II through XII grossly intact intact to light touch throughout. Ue's5-/5 in Deltoids, biceps, triceps; WE 4/5- give way  weakness; Grip 4+/5 and FA 5/5 in lower extremities  Prior neuro assessment is c/w today's exam 02/24/2024.      Assessment/Plan: 1. Functional deficits which require 3+ hours per day of interdisciplinary therapy in a comprehensive inpatient rehab setting. Physiatrist is providing close team supervision and 24 hour management of active medical problems listed below. Physiatrist and rehab team continue to assess barriers to discharge/monitor patient progress toward functional and medical goals  Care Tool:  Bathing    Body parts bathed by patient: Left arm, Right arm, Abdomen, Chest, Face   Body parts bathed by helper: Front perineal area, Buttocks, Right upper leg, Left upper leg, Right lower leg, Left lower leg     Bathing assist Assist Level: Minimal Assistance - Patient > 75%     Upper Body Dressing/Undressing Upper body dressing   What is the patient wearing?: Pull over shirt Orthosis activity level: Performed by helper  Upper body assist Assist Level: Set up assist    Lower Body Dressing/Undressing Lower body dressing      What is the patient wearing?: Pants     Lower body  assist Assist for lower body dressing: Moderate Assistance - Patient 50 - 74%     Toileting Toileting    Toileting assist Assist for toileting: Minimal Assistance - Patient > 75%     Transfers Chair/bed transfer  Transfers assist     Chair/bed transfer assist level: Supervision/Verbal cueing     Locomotion Ambulation   Ambulation assist      Assist level: Supervision/Verbal cueing Assistive device: Walker-rolling Max distance: 8'   Walk 10 feet activity   Assist  Walk 10 feet activity did not occur: Safety/medical concerns  Assist level: Contact Guard/Touching assist Assistive device: Walker-rolling   Walk 50 feet activity   Assist Walk 50 feet with 2 turns activity did not occur: Safety/medical concerns  Assist level: Contact Guard/Touching assist Assistive  device: Walker-rolling    Walk 150 feet activity   Assist Walk 150 feet activity did not occur: Safety/medical concerns (pt too fatigued, SOB)         Walk 10 feet on uneven surface  activity   Assist Walk 10 feet on uneven surfaces activity did not occur: Safety/medical concerns         Wheelchair     Assist Is the patient using a wheelchair?: Yes Type of Wheelchair: Manual Wheelchair activity did not occur: Safety/medical concerns  Wheelchair assist level: Supervision/Verbal cueing Max wheelchair distance: 140'    Wheelchair 50 feet with 2 turns activity    Assist    Wheelchair 50 feet with 2 turns activity did not occur: Safety/medical concerns   Assist Level: Supervision/Verbal cueing   Wheelchair 150 feet activity     Assist  Wheelchair 150 feet activity did not occur: Safety/medical concerns   Assist Level: Supervision/Verbal cueing   Blood pressure (!) 114/45, pulse 82, temperature 98.1 F (36.7 C), temperature source Oral, resp. rate 18, height 5' 10 (1.778 m), weight 126.4 kg, last menstrual period 05/17/2016, SpO2 98%.  Medical Problem List and Plan: 1. Functional deficits secondary to compression and multiple fractures of spine without SCI-intractable acute on chronic low back pain due to osteoporotic fracture involving L1, L3 and L4/acute left pubic symphysis and right sacral ala fracture.  Patient declined kyphoplasty.  Conservative management with narcotics and TLSO             -patient may  shower             -ELOS/Goals: 12-14 days- supervision to min A             -Continue CIR therapies including PT, OT   - L-spine x-ray worsening compression deformity L1 level  -Exp dc 7/1  -Code status changed to full code after discussion with patient- she requested this be changed  -discussed plan for d/c tomorrow   2.  Antithrombotics: -DVT/anticoagulation:  Pharmaceutical: Lovenox .  Check vascular study 6/13- Dopplers (-)              -antiplatelet therapy: N/A 3. Pain Management/chronic pain followed at [facility] pain clinic- usually takes 10 mg 6x/day: Flexeril  5 mg 3 times daily, lidoderm  patch, oxycodone  30 mg every 4 hours as needed upon transfer- attempting, with d/w pt and husband, to add MS Contin  15 mg BID and reduce Oxycodone  to 20 mg q4 hours  6/13- changed to Oxycontin  10 mg BID and continue Oxy 20 mg q4 hours prn  6/16 increase oxycontin  to 15mg  Q12h  Decrease oxycontin  to 15mg  BID given hypotension  4. Anxiety: increase hydroxyzine  to 50mg  Cymbalta  60 mg daily, BuSpar  15 mg  3 times daily, Atarax  25 mg 3 times daily as needed             -added Celexa  10 mg daily for anxiety- is better for anxiety than Duloxetine , but need Duloxetine  for pain control  - 6/17 seen by neuropsychology for anxiety with depression, appreciate assistance             -antipsychotic agents: N/A  -6/25 consult psychiatry for severe anxiety- no medication changes recommended, outpatient treatment with psychiatry and therapist recommended -reviewed psychiatry recommendations with patient  5. Neuropsych/cognition: This patient is ?capable of making decisions on her own behalf.  6. Skin/Wound Care: Routine skin checks 7. Fluids/Electrolytes/Nutrition: Routine in and outs with follow-up chemistries  8.  Sarcoidosis/interstitial lung disease/chronic respiratory failure with hypoxia- Was on 4-5L at home- been doing 3-4L here- and concern for hypercarbia.  Continue chronic oxygen  therapy.  Prednisone  as directed, Lasix  20 mg Monday Wednesday Fridays.  Followed by Dr. Selinda Moots pulmonary services [facility]  6/14- some sob this morning but VS ok. ?anxiety component  -she is receiving brovana  and pulmicort  scheduled with albuterol  prn  -added IS/ and FV today  6/15 breathing better today. I asked her if she took prednisone  with hydrocortisone , and she told me she did at one point, but no longer.    -obsv for now  breathing doing ok- at basline,  continue to monitor  Discussed Incentive spirometer SpO2: 98 % O2 Flow Rate (L/min): 4 L/min FiO2 (%): 40 % 6/22-4: Remains on 4 L nasal cannula, satting well. 6/25 chest CXR, order mucinex  for 3 days CXR- no acute changes, may be anxiety contributing 6/28 reviewed most recent x-ray again with patient 6/29 pulmonary function appears stable, continue to monitor 9.  Left femoral head avascular necrosis.Probably has bilaterally, based on chart review  Not a surgical candidate.  Conservative care  6/13- went over with therapy- that her goals from my point of view is to walk 15-25 ft, but probably not more- will not make AVN worse, but will hurt more-   10.  E. coli UTI as well as chronic urinary incontinence.  Status post 3-day course of IV Rocephin  completed.  Continue Flomax  0.4 mg daily.  Check PVR  6/14-15 pt is voiding continently. No PVR's but I'm not sure they'reneeded right now  6/16 check PVRs, pt reports she doesn't empty completely - do not see any completed yet 6/19 recheck U/A and culture for dysuria, ask nursing completed PVRs  -Start keflex - monitor cultures- pseudomonas- sensitivities pending- Change to Levofloxacin  6/21: cx + pseudomonas; given n.p.o. will switch to IV cefepime  2 g q8h 6/22: Continence improving, continue IV cefepime  for now.  Discussed risks of Foley placement for incontinence given patient discomfort with transferring to bedside commode, she is agreeable to continuing this intermittent straight cathing as needed; husband has been trained on straight cathing in the past and is comfortable doing this at discharge if needed 6/23 continues with intermittent catheterizations, continue current regimen and monitor 6/24 increase flomax  to 0.8mg  6/25 antibiotic discontinued after discussion with pharmacy, has completed 7 days 6/26 discussed avoiding use of purewick in preparation for discharge home and can increase UTI risk 6/28 continue PVRs, patient did have 900 mL  output with PVR only 382.  Okay to In-N-Out cath as needed bladder/suprapubic discomfort.  Husband has been doing In-N-Out cath before admission 6/29 UA completed yesterday does not indicate infection  11.  Normocytic anemia.  Borderline MCV of 96 and RDW of 16.  B12 and folate within normal limits.  Check CBC  6/14- CBC HGB stable at 8.3  6/26 stable hgb at 8.3  Recheck labs tomorrow 12.  Addison's disease.  Continue hydroco rtisone chronically.  Followed by endocrinology services Dr.Gebreselassie Nida  - 6/28 family is wondering if patient will be able to come off hydrocortisone .  I think this would be best to discuss with endocrinology outpatient 13.  Chronic hypotension.  ProAmatine  5 mg twice daily    02/24/2024    5:35 AM 02/23/2024    7:22 PM 02/23/2024    2:37 PM  Vitals with BMI  Systolic 114 105 890  Diastolic 45 56 64  Pulse 82 77 81  6/29 controlled continue to monitor  14.  Class III morbid obesity.  BMI 39.75.  Dietary follow-up 15.  Hyperlipidemia.  Crestor  16.  Hypothyroidism.  Synthroid  17.  Chronic constipation/ileus.  Linzess  72 mcg daily, Senokot S2 tablets bedtime, Chronulac  as needed  6/13- LBM 2 days ago- she says doesn't go every day  6/15 I suspect her nausea is related to this. We also started fosamax  yesterday. Discussed how oxycodone  can cause nausea and constipation   -  KUB today reveals moderate stool burden, no ileus   -will order fleets enema   -rx nausea, increase ppi to bid   -increase senna to bid  6/16 reports good results with enema yesterday, continue to monitor   6/17 LBM 6/15 consider additional medication if no BM by then  6/18 Patient reports BM yesterday, continue to monitor   6/20 Sorbitol  ordered this AM- no results will order xray abdomen and Enema- discussed with nursing- had BM with this, add miralax   Small, type I bowel movement 6/20  6/21: KUB with likely ileus--diffuse distention and abdominal pain with slow bowel sounds-- will  consult surgery per addendum yesterday.  Started on maintenance IV fluids  6/22: Surgery evaluated yesterday, deemed nonop management, diet as tolerated.  Tolerated liquid diet overnight, will advance to soft today.  DC IV fluids.  Start Reglan  5 mg 3 times daily.  Getting soapsuds enema tonight given no BM for 2 days.  -- Given she is passing flatus, if adequate bowel movement tonight and good control of symptoms in AM, would consider advancement to full diet.  Will get KUB for a.m.  6/23 continue soft diet today, KUB nonobstructive today, mild stool burden.  Will hold off on increase of pain medications although she does have back pain as do not want to worsen this  6/24 upgrade diet to regular, consider DC reglan , ate 85% meal today, increase miralax  to TID  6/25 recheck abdomina xray assess stool burdon/nausea. Will do bowel prep 1/2 container for constipation  6/26 Xray abdomen no acute changes, sorbitol  ordered for consitpation, she wasn't given bowel prep yesterday?, discussed enema if no results  6/27 LBM today and she had BM yesterday too, continue to monitor  6/28 several BM yesterday, continue current   Reglan  Discontinued  6/29 large BM yesterday, continue current regimen 18.  GERD.  Protonix  19. Noncompliance- Per nursing, pt refusing to use TLSO for [facility] transfers, of note- will d/w pt that need to follow recs, or there's more risk of additional fractures.  20. Hypokalemia  6/13- will replete 40 mEq and then recheck Monday  6/23 low normal potassium at 3.5, will add 20 mEq x 1  6/26 K+ repletion ordered  6/28 improved to 3.6 yesterday  21. Azotemia  -6/18 Cr 1.04 encourage oral fluids-order  placed  -6/23 --stable creatinine 1.0, encourage oral intake  -6/26 Cr improved to 0.86   BMP tomorrow   22. Dysphagia  -SLP consult completed  23. Chest pain, appears more epigastic. Suspect this is more likely epigastric/dyspepsia or anxiety than cardiac.Nursing indicates has  improved/resolved later in the day  - EKG with few PACs. Ordered troponins - not elevated  -Maalox ordered  24. Hypotension: decrease oxycontin  to 15mg  BID, continue to monitor BP TID   LOS: 18 days A FACE TO FACE EVALUATION WAS PERFORMED  Sven SQUIBB Tija Biss 02/24/2024, 12:59 PM

## 2024-02-24 NOTE — Plan of Care (Signed)
  Problem: RH Balance Goal: LTG Patient will maintain dynamic standing with ADLs (OT) Description: LTG:  Patient will maintain dynamic standing balance with assist during activities of daily living (OT)  Outcome: Not Met (add Reason) Flowsheets (Taken 02/24/2024 1626) LTG: Pt will maintain dynamic standing balance during ADLs with: (lack of progress and variability with assistance) Contact Guard/Touching assist   Problem: RH Bathing Goal: LTG Patient will bathe all body parts with assist levels (OT) Description: LTG: Patient will bathe all body parts with assist levels (OT) Outcome: Not Met (add Reason) Flowsheets (Taken 02/24/2024 1626) LTG: Pt will perform bathing with assistance level/cueing: Minimal Assistance - Patient > 75% LTG: Position pt will perform bathing: (lack of progress and variability with assistance status) --   Problem: RH Balance Goal: LTG: Patient will maintain dynamic sitting balance (OT) Description: LTG:  Patient will maintain dynamic sitting balance with assistance during activities of daily living (OT) Outcome: Completed/Met Flowsheets (Taken 02/07/2024 1240 by Claudene Nest L, OT) LTG: Pt will maintain dynamic sitting balance during ADLs with: Supervision/Verbal cueing   Problem: RH Dressing Goal: LTG Patient will perform upper body dressing (OT) Description: LTG Patient will perform upper body dressing with assist, with/without cues (OT). Outcome: Completed/Met Flowsheets (Taken 02/07/2024 1240 by Claudene Nest CROME, OT) LTG: Pt will perform upper body dressing with assistance level of: Supervision/Verbal cueing Goal: LTG Patient will perform lower body dressing w/assist (OT) Description: LTG: Patient will perform lower body dressing with assist, with/without cues in positioning using equipment (OT) Outcome: Completed/Met Flowsheets (Taken 02/07/2024 1240 by Claudene Nest CROME, OT) LTG: Pt will perform lower body dressing with assistance level of: Contact  Guard/Touching assist   Problem: RH Toilet Transfers Goal: LTG Patient will perform toilet transfers w/assist (OT) Description: LTG: Patient will perform toilet transfers with assist, with/without cues using equipment (OT) Outcome: Completed/Met Flowsheets (Taken 02/07/2024 1240 by Claudene Nest CROME, OT) LTG: Pt will perform toilet transfers with assistance level of: Supervision/Verbal cueing   Problem: RH Tub/Shower Transfers Goal: LTG Patient will perform tub/shower transfers w/assist (OT) Description: LTG: Patient will perform tub/shower transfers with assist, with/without cues using equipment (OT) Outcome: Completed/Met Flowsheets (Taken 02/07/2024 1240 by Claudene Nest CROME, OT) LTG: Pt will perform tub/shower stall transfers with assistance level of: Minimal Assistance - Patient > 75%   Problem: RH Toileting Goal: LTG Patient will perform toileting task (3/3 steps) with assistance level (OT) Description: LTG: Patient will perform toileting task (3/3 steps) with assistance level (OT)  02/24/2024 1630 by Creasie Camie MATSU, OT Outcome: Not Met (add Reason) Flowsheets (Taken 02/24/2024 1626) LTG: Pt will perform toileting task (3/3 steps) with assistance level: (lack of progress and variability with assistance status) Minimal Assistance - Patient > 75% 02/24/2024 1626 by Creasie Camie MATSU, OT Flowsheets (Taken 02/24/2024 1626) LTG: Pt will perform toileting task (3/3 steps) with assistance level: (lack of progress and variability with assistance status) Minimal Assistance - Patient > 75%

## 2024-02-24 NOTE — Progress Notes (Signed)
 Patient called to room concerning that patient had vomited. Patient was given zofran  from night nurse. Patient requested more zofran  and nurse educated patient to the time frame that she can have the medication. Nurse in room while patient was dry heaving. Encourage patient to do flutter valve and incentive spirometry. Patient started on breathing treatment. Patient has already stated that she will not likely do therapy. Fredie LITTIE Morones, RN

## 2024-02-24 NOTE — Telephone Encounter (Signed)
 Pharmacy Patient Advocate Encounter  Received notification from [facility] [employer] that Prior Authorization for Lidocaine  5% patches has been APPROVED from 02/24/2024 to 02/23/2025   PA #/Case ID/Reference #: [id number] Key: [id number]

## 2024-02-24 NOTE — Progress Notes (Signed)
 Occupational Therapy Discharge Summary  Patient Details  Name: Kathleen [name] MRN: [id number] Date of Birth: [birth year]  Date of Discharge from OT service:February 24, 2024  Today's Date: 02/24/2024 OT Individual Time: 0800-0900 OT Individual Time Calculation (min): 60 min    Patient has met 5 of 8 long term goals due to improved activity tolerance, improved balance, postural control, ability to compensate for deficits, and functional use of  RIGHT upper, RIGHT lower, LEFT upper, and LEFT lower extremity.  Patient to discharge at [facility] Assist level.  Patient's care partner is independent to provide the necessary physical and cognitive assistance at discharge. Family training completed during IPR stay, pt husband and pt with no questions/concerns for D/C.  Reasons goals not met: lack of progress and variability with assistance status   Recommendation:  Patient will benefit from ongoing skilled OT services in home health setting to continue to advance functional skills in the area of BADL, iADL, and Reduce care partner burden.  Equipment: Pt will get TTB out of pocket  Reasons for discharge: treatment goals met and discharge from hospital  Patient/family agrees with progress made and goals achieved: Yes  OT Discharge Precautions/Restrictions  Precautions Precautions: Fall;Back Recall of Precautions/Restrictions: Intact Required Braces or Orthoses: Spinal Brace Spinal Brace: Thoracolumbosacral orthotic;Applied in sitting position Restrictions Weight Bearing Restrictions Per Provider Order: No ADL ADL Equipment Provided: Reacher, Long-handled sponge Eating: Set up Where Assessed-Eating: Wheelchair, Bed level Grooming: Setup Where Assessed-Grooming: Sitting at sink Upper Body Bathing: Supervision/safety Where Assessed-Upper Body Bathing: Shower Lower Body Bathing: Minimal assistance Where Assessed-Lower Body Bathing: Shower Upper Body Dressing: Supervision/safety Where  Assessed-Upper Body Dressing: Edge of bed Lower Body Dressing: Contact guard Where Assessed-Lower Body Dressing: Sitting at sink, Standing at sink Toileting: Minimal assistance Where Assessed-Toileting: Toilet, Bedside Commode Toilet Transfer: Furniture conservator/restorer Method: Surveyor, minerals: Engineer, site Method: Engineer, technical sales: Walk in Music therapist: Administrator, arts Method: Stand pivot Vision Baseline Vision/History: 1 Wears glasses Patient Visual Report: No change from baseline Vision Assessment?: No apparent visual deficits Perception  Perception: Within Functional Limits Praxis Praxis: WFL Cognition Cognition Overall Cognitive Status: Within Functional Limits for tasks assessed Arousal/Alertness: Awake/alert Orientation Level: Person;Place;Situation Person: Oriented Place: Oriented Situation: Oriented Focused Attention: Appears intact Sustained Attention: Appears intact Awareness: Appears intact Problem Solving: Appears intact Behaviors: Lability Safety/Judgment: Impaired Brief Interview for Mental Status (BIMS) Repetition of Three Words (First Attempt): 3 Temporal Orientation: Year: Correct Temporal Orientation: Month: Accurate within 5 days Temporal Orientation: Day: Correct Recall: Sock: Yes, no cue required Recall: Blue: Yes, no cue required Recall: Bed: Yes, no cue required BIMS Summary Score: 15 Sensation Sensation Light Touch: Impaired by gross assessment Light Touch Impaired Details: Impaired LLE;Impaired RLE Hot/Cold: Appears Intact Proprioception: Appears Intact Proprioception Impaired Details: Impaired LLE Stereognosis: Not tested Additional Comments: LLE with bouts of numbness/tingling Coordination Gross Motor Movements are Fluid and Coordinated: No Fine Motor Movements are Fluid and Coordinated:  No Coordination and Movement Description: limited by pain and fear of falling Motor  Motor Motor: Other (comment) Motor - Discharge Observations: acute pain in hips and back, increased pain still present Mobility  Bed Mobility Bed Mobility: Rolling Right;Rolling Left;Supine to Sit;Sitting - Scoot to Edge of Bed;Sit to Supine;Sit to Sidelying Right;Scooting to [facility];Right Sidelying to Sit Rolling Right: Independent with assistive device Rolling Left: Independent with assistive device Right Sidelying to Sit: Independent with assistive device Supine to  Sit: Independent with assistive device Sitting - Scoot to Edge of Bed: Independent with assistive device Sit to Supine: Independent with assistive device Sit to Sidelying Right: Independent with assistive device Scooting to HOB: Moderate Assistance - Patient 50-74% Transfers Sit to Stand: Contact Guard/Touching assist Stand to Sit: Supervision/Verbal cueing  Trunk/Postural Assessment  Cervical Assessment Cervical Assessment: Within Functional Limits Thoracic Assessment Thoracic Assessment: Exceptions to [facility] (rounded shoulders) Lumbar Assessment Lumbar Assessment: Exceptions to [facility] (posterior pelvic tilt) Lumbar AROM Overall Lumbar AROM: Due to pain Lumbar Strength Overall Lumbar Strength: Due to pain Postural Control Postural Control: Deficits on evaluation Trunk Control: requiring UE support d/t pain  Balance Balance Balance Assessed: Yes Static Sitting Balance Static Sitting - Balance Support: Feet supported;Bilateral upper extremity supported Static Sitting - Level of Assistance: 6: Modified independent (Device/Increase time) Dynamic Sitting Balance Dynamic Sitting - Balance Support: Feet supported;Right upper extremity supported;Left upper extremity supported Dynamic Sitting - Level of Assistance: 6: Modified independent (Device/Increase time) Dynamic Sitting Balance - Compensations: UE support needed due to back pain Dynamic  Sitting - Balance Activities: Forward lean/weight shifting;Reaching for objects Sitting balance - Comments: sitting EOB Static Standing Balance Static Standing - Balance Support: During functional activity;Bilateral upper extremity supported Static Standing - Level of Assistance: 5: Stand by assistance Dynamic Standing Balance Dynamic Standing - Balance Support: Bilateral upper extremity supported;During functional activity Dynamic Standing - Level of Assistance: 4: Min assist (CGA) Dynamic Standing - Balance Activities: Reaching for objects Extremity/Trunk Assessment RUE Assessment RUE Assessment: Within Functional Limits LUE Assessment LUE Assessment: Within Functional Limits   Camie Hoe, OTD, OTR/L 02/24/2024, 4:39 PM

## 2024-02-24 NOTE — Progress Notes (Signed)
 Inpatient Rehabilitation Discharge Medication Review by a Pharmacist  A complete drug regimen review was completed for this patient to identify any potential clinically significant medication issues.  High Risk Drug Classes Is patient taking? Indication by Medication  Antipsychotic No   Anticoagulant No   Antibiotic No   Opioid Yes Oxycodone  - pain  Antiplatelet No   Hypoglycemics/insulin  No   Vasoactive Medication Yes Tamsulosin  - retention  Furosemide  - fluid Midodrine  - low BP  Chemotherapy No   Other Yes Citalopram , buspirone , duloxetine , prn hydroxyzine  - mood, anxiety  Cyclobenzaprine  - spasms Revefenacin , albuterol , budesonide  - COPD  Alendronate , Vitamin D  - bones Pantoprazole  - Reflux   Vitamin B 12, folic acid - supplement  Ferrous sulfate  - anemia Hydrocortisone - sarcoidosis  Levothyroxine  - low thyroid  Linaclotide  - constipation Rosuvastatin  - HLD     Type of Medication Issue Identified Description of Issue Recommendation(s)  Drug Interaction(s) (clinically significant)     Duplicate Therapy     Allergy     No Medication Administration End Date     Incorrect Dose     Additional Drug Therapy Needed     Significant med changes from prior encounter (inform family/care partners about these prior to discharge).    Other       Clinically significant medication issues were identified that warrant physician communication and completion of prescribed/recommended actions by midnight of the next day:  No  Name of provider notified for urgent issues identified:   Provider Method of Notification:     Pharmacist comments:   Time spent performing this drug regimen review (minutes): None  Thank you. Olam Monte, PharmD

## 2024-02-24 NOTE — Plan of Care (Signed)
  Problem: Consults Goal: RH SPINAL CORD INJURY PATIENT EDUCATION Description:  See Patient Education module for education specifics.  Outcome: Progressing   Problem: SCI BOWEL ELIMINATION Goal: RH STG MANAGE BOWEL WITH ASSISTANCE Description: STG Manage Bowel with toileting Assistance. Outcome: Progressing Goal: RH STG SCI MANAGE BOWEL WITH MEDICATION WITH ASSISTANCE Description: STG SCI Manage bowel with medication with mod I assistance. Outcome: Progressing Goal: RH STG SCI MANAGE BOWEL PROGRAM W/ASSIST OR AS APPROPRIATE Description: STG SCI Manage bowel program w/assist or as appropriate. Outcome: Progressing   Problem: SCI BLADDER ELIMINATION Goal: RH STG MANAGE BLADDER WITH ASSISTANCE Description: STG Manage Bladder With Assistance Outcome: Progressing Goal: RH STG MANAGE BLADDER WITH MEDICATION WITH ASSISTANCE Description: STG Manage Bladder With Medication With mod I Assistance. Outcome: Progressing Goal: RH STG SCI MANAGE BLADDER PROGRAM W/ASSISTANCE Description: Toileting protocol Outcome: Progressing   Problem: RH SAFETY Goal: RH STG ADHERE TO SAFETY PRECAUTIONS W/ASSISTANCE/DEVICE Description: STG Adhere to Safety Precautions With cues Assistance/Device. Outcome: Progressing   Problem: RH KNOWLEDGE DEFICIT SCI Goal: RH STG INCREASE KNOWLEDGE OF SELF CARE AFTER SCI Description: Patient and spouse will be able to manage care at discharge using educational resources for medication, skin care, transfers, toileting, dietary modification independently Outcome: Progressing

## 2024-02-25 ENCOUNTER — Other Ambulatory Visit (HOSPITAL_COMMUNITY): Payer: Self-pay

## 2024-02-25 MED ORDER — MORPHINE SULFATE ER 15 MG PO TBCR
15.0000 mg | EXTENDED_RELEASE_TABLET | Freq: Two times a day (BID) | ORAL | 0 refills | Status: DC
  Filled 2024-02-25: qty 28, 14d supply, fill #0

## 2024-02-25 NOTE — Progress Notes (Signed)
 PROGRESS NOTE   Subjective/Complaints:  Pt reports her back is killing her and it's the worst pain she's ever had- pain did get worse after reduction in Oxycontin , however her BP also got better when it was reduced.    Pt asking where will f/u for pain meds- explained since has hx of seeing [facility], she will need to go back there- she only gets 5 days of pain meds since didn't have any surgery for back compression fractures.   We discussed that will take 8-12 weeks minimum before pain will substantially improve. LBM overnight.     ROS:  Pt denies SOB, abd pain, CP, N/V/C/D, and vision changes  + back pain-continued  + anxiety- continues +Chronic urinary retention + constipation - continued + Abdominal pain/nausea   Objective:   No results found.   Recent Labs    02/24/24 0520  WBC 7.5  HGB 8.7*  HCT 30.8*  PLT 331     Recent Labs    02/24/24 0520  NA 140  K 4.0  CL 100  CO2 30  GLUCOSE 104*  BUN <5*  CREATININE 0.74  CALCIUM  8.3*     Intake/Output Summary (Last 24 hours) at 02/25/2024 0848 Last data filed at 02/25/2024 0249 Gross per 24 hour  Intake 600 ml  Output 1625 ml  Net -1025 ml         Physical Exam: Vital Signs Blood pressure (!) 107/55, pulse 85, temperature 98.2 F (36.8 C), temperature source Oral, resp. rate 18, height 5' 10 (1.778 m), weight 126.4 kg, last menstrual period 05/17/2016, SpO2 95%.   General: awake, alert, husband asleep at bedside; asleep when I entered room; on R side; NAD HENT: conjugate gaze; oropharynx moist CV: regular rate and rhythm; no JVD Pulmonary: CTA B/L; no W/R/R- decreased at bases- on 4L O2 by [place]- O2 levels dropped <30 seconds to 81%, but immediately came back up GI: soft, NT, ND, (+)BS- protuberant/BMI 40 Psychiatric: somewhat to very anxious Neurological: Ox3  MSK:      No apparent deformity.       Neurologic exam:   Awake, alert, oriented x 4.  No apparent deficits.  Cranial nerves II through XII grossly intact intact to light touch throughout. Ue's5-/5 in Deltoids, biceps, triceps; WE 4/5- give way weakness; Grip 4+/5 and FA 5/5 in lower extremities  Prior neuro assessment is c/w today's exam 02/25/2024.      Assessment/Plan: 1. Functional deficits which require 3+ hours per day of interdisciplinary therapy in a comprehensive inpatient rehab setting. Physiatrist is providing close team supervision and 24 hour management of active medical problems listed below. Physiatrist and rehab team continue to assess barriers to discharge/monitor patient progress toward functional and medical goals  Care Tool:  Bathing    Body parts bathed by patient: Left arm, Right arm, Abdomen, Chest, Face   Body parts bathed by helper: Front perineal area, Buttocks, Right upper leg, Left upper leg, Right lower leg, Left lower leg     Bathing assist Assist Level: Minimal Assistance - Patient > 75%     Upper Body Dressing/Undressing Upper body dressing   What is the patient wearing?: Pull  over shirt Orthosis activity level: Performed by helper  Upper body assist Assist Level: Independent with assistive device    Lower Body Dressing/Undressing Lower body dressing      What is the patient wearing?: Pants     Lower body assist Assist for lower body dressing: Contact Guard/Touching assist     Toileting Toileting    Toileting assist Assist for toileting: Minimal Assistance - Patient > 75%     Transfers Chair/bed transfer  Transfers assist     Chair/bed transfer assist level: Contact Guard/Touching assist     Locomotion Ambulation   Ambulation assist      Assist level: Supervision/Verbal cueing Assistive device: Walker-rolling Max distance: x40   Walk 10 feet activity   Assist  Walk 10 feet activity did not occur: Safety/medical concerns  Assist level: Contact Guard/Touching  assist Assistive device: Walker-rolling   Walk 50 feet activity   Assist Walk 50 feet with 2 turns activity did not occur: Safety/medical concerns  Assist level: Contact Guard/Touching assist Assistive device: Walker-rolling    Walk 150 feet activity   Assist Walk 150 feet activity did not occur: Safety/medical concerns (pt too fatigued, SOB)         Walk 10 feet on uneven surface  activity   Assist Walk 10 feet on uneven surfaces activity did not occur: Safety/medical concerns         Wheelchair     Assist Is the patient using a wheelchair?: Yes Type of Wheelchair: Manual Wheelchair activity did not occur: Safety/medical concerns  Wheelchair assist level: Supervision/Verbal cueing Max wheelchair distance: 25'    Wheelchair 50 feet with 2 turns activity    Assist    Wheelchair 50 feet with 2 turns activity did not occur: Safety/medical concerns   Assist Level: Supervision/Verbal cueing   Wheelchair 150 feet activity     Assist  Wheelchair 150 feet activity did not occur: Safety/medical concerns   Assist Level: Supervision/Verbal cueing   Blood pressure (!) 107/55, pulse 85, temperature 98.2 F (36.8 C), temperature source Oral, resp. rate 18, height 5' 10 (1.778 m), weight 126.4 kg, last menstrual period 05/17/2016, SpO2 95%.  Medical Problem List and Plan: 1. Functional deficits secondary to compression and multiple fractures of spine without SCI-intractable acute on chronic low back pain due to osteoporotic fracture involving L1, L3 and L4/acute left pubic symphysis and right sacral ala fracture.  Patient declined kyphoplasty.  Conservative management with narcotics and TLSO             -patient may  shower             -ELOS/Goals: 12-14 days- supervision to min A             -Continue CIR therapies including PT, OT   - L-spine x-ray worsening compression deformity L1 level  -Exp dc 7/1  -Code status changed to full code after discussion  with patient- she requested this be changed  D/c today  Explained to pt needs to f/u with [facility] and needs sleepy study as well as F/U with Pulmonary due to CO2 retention.   2.  Antithrombotics: -DVT/anticoagulation:  Pharmaceutical: Lovenox .  Check vascular study 6/13- Dopplers (-) 7/1- don't send home on Lovenox              -antiplatelet therapy: N/A 3. Pain Management/chronic pain followed at [facility] pain [facility]- usually takes 10 mg 6x/day: Flexeril  5 mg 3 times daily, lidoderm  patch, oxycodone  30 mg every 4 hours as  needed upon transfer- attempting, with d/w pt and husband, to add MS Contin  15 mg BID and reduce Oxycodone  to 20 mg q4 hours  6/13- changed to Oxycontin  10 mg BID and continue Oxy 20 mg q4 hours prn  6/16 increase oxycontin  to 15mg  Q12h  Decrease oxycontin  to 15mg  BID given hypotension  7/1- educated pt that pain will take weeks ot months to get better- unfortunately her BP will not tolerate her on higher dose of pain meds, esp with her already high CO2 retention.  4. Anxiety: increase hydroxyzine  to 50mg  Cymbalta  60 mg daily, BuSpar  15 mg 3 times daily, Atarax  25 mg 3 times daily as needed             -added Celexa  10 mg daily for anxiety- is better for anxiety than Duloxetine , but need Duloxetine  for pain control  - 6/17 seen by neuropsychology for anxiety with depression, appreciate assistance             -antipsychotic agents: N/A  -6/25 consult psychiatry for severe anxiety- no medication changes recommended, outpatient treatment with psychiatry and therapist recommended -reviewed psychiatry recommendations with patient  5. Neuropsych/cognition: This patient is ?capable of making decisions on her own behalf.  6. Skin/Wound Care: Routine skin checks 7. Fluids/Electrolytes/Nutrition: Routine in and outs with follow-up chemistries  8.  Sarcoidosis/interstitial lung disease/chronic respiratory failure with hypoxia- Was on 4-5L at home- been doing 3-4L here-  and concern for hypercarbia.  Continue chronic oxygen  therapy.  Prednisone  as directed, Lasix  20 mg Monday Wednesday Fridays.  Followed by Dr. Selinda Moots pulmonary services [facility]  6/14- some sob this morning but VS ok. ?anxiety component  -she is receiving brovana  and pulmicort  scheduled with albuterol  prn  -added IS/ and FV today  6/15 breathing better today. I asked her if she took prednisone  with hydrocortisone , and she told me she did at one point, but no longer.    -obsv for now  breathing doing ok- at basline, continue to monitor  Discussed Incentive spirometer SpO2: 95 % O2 Flow Rate (L/min): 3 L/min FiO2 (%): 40 % 6/22-4: Remains on 4 L nasal cannula, satting well. 6/25 chest CXR, order mucinex  for 3 days CXR- no acute changes, may be anxiety contributing 6/28 reviewed most recent x-ray again with patient 6/29 pulmonary function appears stable, continue to monitor 7/1- will need f/u with Pulmonary for Sleep study as well as Co2 retention 9.  Left femoral head avascular necrosis.Probably has bilaterally, based on chart review  Not a surgical candidate.  Conservative care  6/13- went over with therapy- that her goals from my point of view is to walk 15-25 ft, but probably not more- will not make AVN worse, but will hurt more-   10.  E. coli UTI as well as chronic urinary incontinence.  Status post 3-day course of IV Rocephin  completed.  Continue Flomax  0.4 mg daily.  Check PVR  6/14-15 pt is voiding continently. No PVR's but I'm not sure they'reneeded right now  6/16 check PVRs, pt reports she doesn't empty completely - do not see any completed yet 6/19 recheck U/A and culture for dysuria, ask nursing completed PVRs  -Start keflex - monitor cultures- pseudomonas- sensitivities pending- Change to Levofloxacin  6/21: cx + pseudomonas; given n.p.o. will switch to IV cefepime  2 g q8h 6/22: Continence improving, continue IV cefepime  for now.  Discussed risks of Foley placement for  incontinence given patient discomfort with transferring to bedside commode, she is agreeable to continuing this intermittent straight cathing  as needed; husband has been trained on straight cathing in the past and is comfortable doing this at discharge if needed 6/23 continues with intermittent catheterizations, continue current regimen and monitor 6/24 increase flomax  to 0.8mg  6/25 antibiotic discontinued after discussion with pharmacy, has completed 7 days 6/26 discussed avoiding use of purewick in preparation for discharge home and can increase UTI risk 6/28 continue PVRs, patient did have 900 mL output with PVR only 382.  Okay to In-N-Out cath as needed bladder/suprapubic discomfort.  Husband has been doing In-N-Out cath before admission 6/29 UA completed yesterday does not indicate infection  11.  Normocytic anemia.  Borderline MCV of 96 and RDW of 16.  B12 and folate within normal limits.  Check CBC  6/14- CBC HGB stable at 8.3  6/26 stable hgb at 8.3  Recheck labs tomorrow 12.  Addison's disease.  Continue hydroco rtisone chronically.  Followed by endocrinology services Dr.Gebreselassie Nida  - 6/28 family is wondering if patient will be able to come off hydrocortisone .  I think this would be best to discuss with endocrinology outpatient 13.  Chronic hypotension.  ProAmatine  5 mg twice daily    02/25/2024    5:01 AM 02/24/2024    8:36 PM 02/24/2024    7:46 PM  Vitals with BMI  Systolic 107  127  Diastolic 55  69  Pulse 85 82 87  6/29 controlled continue to monitor 7/1- don't wqnt to increase pain meds back due to hypotension on higher dose of Oxycontin  14.  Class III morbid obesity.  BMI 39.75.  Dietary follow-up 15.  Hyperlipidemia.  Crestor  16.  Hypothyroidism.  Synthroid  17.  Chronic constipation/ileus.  Linzess  72 mcg daily, Senokot S2 tablets bedtime, Chronulac  as needed  6/13- LBM 2 days ago- she says doesn't go every day  6/15 I suspect her nausea is related to this. We also  started fosamax  yesterday. Discussed how oxycodone  can cause nausea and constipation   -  KUB today reveals moderate stool burden, no ileus   -will order fleets enema   -rx nausea, increase ppi to bid   -increase senna to bid  6/16 reports good results with enema yesterday, continue to monitor   6/17 LBM 6/15 consider additional medication if no BM by then  6/18 Patient reports BM yesterday, continue to monitor   6/20 Sorbitol  ordered this AM- no results will order xray abdomen and Enema- discussed with nursing- had BM with this, add miralax   Small, type I bowel movement 6/20  6/21: KUB with likely ileus--diffuse distention and abdominal pain with slow bowel sounds-- will consult surgery per addendum yesterday.  Started on maintenance IV fluids  6/22: Surgery evaluated yesterday, deemed nonop management, diet as tolerated.  Tolerated liquid diet overnight, will advance to soft today.  DC IV fluids.  Start Reglan  5 mg 3 times daily.  Getting soapsuds enema tonight given no BM for 2 days.  -- Given she is passing flatus, if adequate bowel movement tonight and good control of symptoms in AM, would consider advancement to full diet.  Will get KUB for a.m.  6/23 continue soft diet today, KUB nonobstructive today, mild stool burden.  Will hold off on increase of pain medications although she does have back pain as do not want to worsen this  6/24 upgrade diet to regular, consider DC reglan , ate 85% meal today, increase miralax  to TID  6/25 recheck abdomina xray assess stool burdon/nausea. Will do bowel prep 1/2 container for constipation  6/26 Xray abdomen  no acute changes, sorbitol  ordered for consitpation, she wasn't given bowel prep yesterday?, discussed enema if no results  6/27 LBM today and she had BM yesterday too, continue to monitor  6/28 several BM yesterday, continue current   Reglan  Discontinued  6/29 large BM yesterday, continue current regimen  7/1- LBM overnight 18.  GERD.   Protonix  19. Noncompliance- Per nursing, pt refusing to use TLSO for [facility] transfers, of note- will d/w pt that need to follow recs, or there's more risk of additional fractures.  20. Hypokalemia  6/13- will replete 40 mEq and then recheck Monday  6/23 low normal potassium at 3.5, will add 20 mEq x 1  6/26 K+ repletion ordered  6/28 improved to 3.6 yesterday  21. Azotemia  -6/18 Cr 1.04 encourage oral fluids-order placed  -6/23 --stable creatinine 1.0, encourage oral intake  -6/26 Cr improved to 0.86   BMP tomorrow   22. Dysphagia  -SLP consult completed  23. Chest pain, appears more epigastic. Suspect this is more likely epigastric/dyspepsia or anxiety than cardiac.Nursing indicates has improved/resolved later in the day  - EKG with few PACs. Ordered troponins - not elevated  -Maalox ordered  24. Hypotension: decrease oxycontin  to 15mg  BID, continue to monitor BP TID   I spent a total of  36  minutes on total care today- >50% coordination of care- due to  D/w team, review of old d/c summary to arrange f/u; as well as d/w PA and pt about pain as detailed above.    The patient is medically ready for discharge to home and will not need follow-up with [facility]. In addition, they will need to follow up with their PCP, Pulmonary and Orthopedics.    LOS: 19 days A FACE TO FACE EVALUATION WAS PERFORMED  Kathleen [name] 02/25/2024, 8:48 AM

## 2024-02-25 NOTE — Progress Notes (Signed)
 Inpatient Rehabilitation Care Coordinator Discharge Note   Patient Details  Name: Kathleen [name] MRN: [id number] Date of Birth: October 16, 1960   Discharge location: HOME WITH HUSBAND WHO HAS BEEN PROVIDING 24/7 CARE  Length of Stay: 19 DAYS  Discharge activity level: CGA-MIN ASSIST LEVEL  Home/community participation: HOMEBOUND  Patient response un:Yzjouy Literacy - How often do you need to have someone help you when you read instructions, pamphlets, or other written material from your doctor or pharmacy?: Never  Patient response un:Dnrpjo Isolation - How often do you feel lonely or isolated from those around you?: Never  Services provided included: MD, RD, PT, OT, RN, CM, TR, Pharmacy, Neuropsych, SW  Financial Services:  Field seismologist Utilized: Private Insurance HEALTH TEAM ADVANTAGE  Choices offered to/list presented to: PT AND HUSBAND  Follow-up services arranged:  Home Health, Patient/Family request agency HH/DME Home Health Agency: [facility]  PT  OT  RN  AIDE     HAS ALL DME FROM PREVIOUS ADMISSIONS HH/DME Requested Agency: [facility]  Patient response to transportation need: Is the patient able to respond to transportation needs?: Yes In the past 12 months, has lack of transportation kept you from medical appointments or from getting medications?: No In the past 12 months, has lack of transportation kept you from meetings, work, or from getting things needed for daily living?: No   Patient/Family verbalized understanding of follow-up arrangements:  Yes  Individual responsible for coordination of the follow-up plan: DALE-HUSBAND [phone]  Confirmed correct DME delivered: Raymonde Asberry MATSU 02/25/2024    Comments (or additional information):PT DID WELL AND HUSBAND WAS HERE DAILY TO PROVIDE ASSIST AND EMOTIONAL SUPPORT.  Summary of Stay    Date/Time Discharge Planning CSW  02/18/24 1038 Pt is progressing and alos needs encouragement due to her anxiety. She  wants to be as good as she can be before going home. Neuro-psych saw and daughter and husband here to provide support RGD  02/11/24 1019 Home with husband who was assisting prior to admission but does have a bad back.  Pt hopes to be stronger and move better from here. Neuro-psych to see RGD       Tuyet Bader, Asberry MATSU

## 2024-02-25 NOTE — Progress Notes (Signed)
 Patient educated that TLSO brace needs to be applied when transferring to bedside commode. Patient refused to wear TLSO brace. Patient educated on risk and benefits of TLSO brace. Patient verbalized understanding. Will continue to provide patient with education when getting out of bed.

## 2024-03-02 ENCOUNTER — Telehealth: Payer: Self-pay

## 2024-03-02 ENCOUNTER — Other Ambulatory Visit (HOSPITAL_COMMUNITY): Payer: Self-pay

## 2024-03-02 NOTE — Telephone Encounter (Signed)
 Copied from CRM [id number]. Topic: Clinical - Home Health Verbal Orders >> Feb 27, 2024  2:37 PM Nathanel BROCKS wrote: Caller/Agency: [facility] Callback Number: [phone] Service Requested: Skilled Nursing Frequency: nurse to come once a week for 3 weeks, home health aide once a week for 3 weeks, see about getting a Presenter, broadcasting. Any new concerns about the patient? No

## 2024-03-06 ENCOUNTER — Telehealth: Payer: Self-pay | Admitting: *Deleted

## 2024-03-06 ENCOUNTER — Other Ambulatory Visit (HOSPITAL_COMMUNITY): Payer: Self-pay

## 2024-03-06 DIAGNOSIS — E6609 Other obesity due to excess calories: Secondary | ICD-10-CM | POA: Diagnosis not present

## 2024-03-06 DIAGNOSIS — Z79899 Other long term (current) drug therapy: Secondary | ICD-10-CM | POA: Diagnosis not present

## 2024-03-06 DIAGNOSIS — Z6836 Body mass index (BMI) 36.0-36.9, adult: Secondary | ICD-10-CM | POA: Diagnosis not present

## 2024-03-06 DIAGNOSIS — M87052 Idiopathic aseptic necrosis of left femur: Secondary | ICD-10-CM | POA: Diagnosis not present

## 2024-03-06 DIAGNOSIS — G8929 Other chronic pain: Secondary | ICD-10-CM | POA: Diagnosis not present

## 2024-03-06 DIAGNOSIS — M25552 Pain in left hip: Secondary | ICD-10-CM | POA: Diagnosis not present

## 2024-03-06 MED ORDER — OXYCODONE HCL 15 MG PO TABS
15.0000 mg | ORAL_TABLET | ORAL | 0 refills | Status: DC | PRN
  Filled 2024-03-06: qty 180, 30d supply, fill #0

## 2024-03-06 NOTE — Telephone Encounter (Signed)
 So noted

## 2024-03-06 NOTE — Telephone Encounter (Unsigned)
 Copied from CRM [id number]. Topic: General - Other >> Mar 06, 2024  4:25 PM Tobias L wrote: Reason for CRM: Marval Sacks - Social Worker [employer] calling to report she saw patient yesterday, and provided her with a few resources. Marval states she does not plan on seeing patient again unless another order is requested.   Best call back number: [phone]

## 2024-03-09 ENCOUNTER — Ambulatory Visit: Admitting: Family Medicine

## 2024-03-09 ENCOUNTER — Other Ambulatory Visit (HOSPITAL_COMMUNITY): Payer: Self-pay

## 2024-03-09 ENCOUNTER — Telehealth: Payer: Self-pay | Admitting: "Endocrinology

## 2024-03-09 VITALS — BP 115/68 | HR 89

## 2024-03-09 DIAGNOSIS — R339 Retention of urine, unspecified: Secondary | ICD-10-CM | POA: Diagnosis not present

## 2024-03-09 DIAGNOSIS — M8000XG Age-related osteoporosis with current pathological fracture, unspecified site, subsequent encounter for fracture with delayed healing: Secondary | ICD-10-CM | POA: Diagnosis not present

## 2024-03-09 DIAGNOSIS — J849 Interstitial pulmonary disease, unspecified: Secondary | ICD-10-CM

## 2024-03-09 DIAGNOSIS — S22000G Wedge compression fracture of unspecified thoracic vertebra, subsequent encounter for fracture with delayed healing: Secondary | ICD-10-CM

## 2024-03-09 DIAGNOSIS — F321 Major depressive disorder, single episode, moderate: Secondary | ICD-10-CM

## 2024-03-09 DIAGNOSIS — G894 Chronic pain syndrome: Secondary | ICD-10-CM | POA: Diagnosis not present

## 2024-03-09 MED ORDER — LINACLOTIDE 145 MCG PO CAPS
145.0000 ug | ORAL_CAPSULE | Freq: Every day | ORAL | 3 refills | Status: DC
  Filled 2024-03-09: qty 30, 30d supply, fill #0
  Filled 2024-04-17 – 2024-05-02 (×2): qty 30, 30d supply, fill #1
  Filled 2024-06-02: qty 30, 30d supply, fill #2
  Filled 2024-06-29: qty 30, 30d supply, fill #3

## 2024-03-09 MED ORDER — LEVOTHYROXINE SODIUM 50 MCG PO TABS
50.0000 ug | ORAL_TABLET | Freq: Every day | ORAL | 5 refills | Status: AC
  Filled 2024-03-09 – 2024-03-23 (×3): qty 30, 30d supply, fill #0
  Filled 2024-04-17 – 2024-05-24 (×2): qty 30, 30d supply, fill #1
  Filled 2024-06-25: qty 30, 30d supply, fill #2
  Filled 2024-07-21: qty 30, 30d supply, fill #3
  Filled 2024-08-31 (×2): qty 30, 30d supply, fill #4
  Filled 2024-09-22 – 2024-09-23 (×2): qty 30, 30d supply, fill #5

## 2024-03-09 MED ORDER — FUROSEMIDE 20 MG PO TABS
ORAL_TABLET | ORAL | 5 refills | Status: DC
  Filled 2024-03-09: qty 30, fill #0

## 2024-03-09 MED ORDER — PANTOPRAZOLE SODIUM 40 MG PO TBEC
40.0000 mg | DELAYED_RELEASE_TABLET | Freq: Two times a day (BID) | ORAL | 5 refills | Status: AC
  Filled 2024-03-09 – 2024-03-23 (×3): qty 60, 30d supply, fill #0
  Filled 2024-04-17 – 2024-08-04 (×2): qty 60, 30d supply, fill #1
  Filled 2024-08-31: qty 60, 30d supply, fill #2
  Filled 2024-09-22: qty 60, 30d supply, fill #3
  Filled ????-??-??: fill #3

## 2024-03-09 MED ORDER — TAMSULOSIN HCL 0.4 MG PO CAPS
0.8000 mg | ORAL_CAPSULE | Freq: Every day | ORAL | 5 refills | Status: AC
Start: 2024-03-09 — End: ?
  Filled 2024-03-09 – 2024-05-02 (×3): qty 60, 30d supply, fill #0

## 2024-03-09 MED ORDER — MIDODRINE HCL 5 MG PO TABS
5.0000 mg | ORAL_TABLET | Freq: Two times a day (BID) | ORAL | 5 refills | Status: DC
  Filled 2024-03-09 – 2024-03-23 (×3): qty 60, 30d supply, fill #0
  Filled 2024-04-17: qty 60, 30d supply, fill #1

## 2024-03-09 MED ORDER — DULOXETINE HCL 60 MG PO CPEP
60.0000 mg | ORAL_CAPSULE | Freq: Every day | ORAL | 5 refills | Status: DC
  Filled 2024-03-09 – 2024-03-23 (×3): qty 30, 30d supply, fill #0
  Filled 2024-04-17 – 2024-05-02 (×2): qty 30, 30d supply, fill #1

## 2024-03-09 MED ORDER — ALENDRONATE SODIUM 70 MG PO TABS
70.0000 mg | ORAL_TABLET | ORAL | 8 refills | Status: AC
  Filled 2024-03-09 – 2024-03-23 (×2): qty 5, 35d supply, fill #0
  Filled 2024-03-23: qty 4, 28d supply, fill #0
  Filled 2024-04-17 – 2024-05-02 (×2): qty 4, 28d supply, fill #1
  Filled 2024-06-16: qty 4, 28d supply, fill #2
  Filled 2024-07-21: qty 4, 28d supply, fill #3
  Filled 2024-08-10 – 2024-08-11 (×2): qty 4, 28d supply, fill #4
  Filled 2024-09-11: qty 4, 28d supply, fill #5

## 2024-03-09 MED ORDER — BUSPIRONE HCL 15 MG PO TABS
15.0000 mg | ORAL_TABLET | Freq: Three times a day (TID) | ORAL | 5 refills | Status: AC
  Filled 2024-03-09 – 2024-03-23 (×3): qty 90, 30d supply, fill #0
  Filled 2024-04-17 – 2024-05-02 (×2): qty 90, 30d supply, fill #1
  Filled 2024-06-02: qty 90, 30d supply, fill #2
  Filled 2024-06-29: qty 90, 30d supply, fill #3

## 2024-03-09 MED ORDER — ROSUVASTATIN CALCIUM 20 MG PO TABS
20.0000 mg | ORAL_TABLET | Freq: Every day | ORAL | 5 refills | Status: AC
  Filled 2024-03-09 – 2024-03-23 (×3): qty 30, 30d supply, fill #0
  Filled 2024-04-17 – 2024-05-02 (×2): qty 30, 30d supply, fill #1
  Filled 2024-06-02: qty 30, 30d supply, fill #2
  Filled 2024-06-29: qty 30, 30d supply, fill #3
  Filled 2024-08-06: qty 30, 30d supply, fill #4
  Filled 2024-08-31: qty 30, 30d supply, fill #5

## 2024-03-09 NOTE — Progress Notes (Signed)
 Subjective:    Patient ID: Kathleen [name], female    DOB: 1960/09/09, 63 y.o.   MRN: [id number]  Discussed the use of AI scribe software for clinical note transcription with the patient, who gave verbal consent to proceed.  History of Present Illness   The patient presents with back pain and mobility issues due to compression fractures.  She has been experiencing significant back pain due to compression fractures. Various pain medications have been tried, but she has difficulty with their side effects, including morphine  which made her 'sick as a dog'. She is currently taking 15 mg of oxycodone  every four hours for pain management, sometimes substituting with 10 mg for breakthrough pain.  She has been receiving home healthcare services, including physical therapy and nursing care, to assist with her mobility and daily activities. Despite these efforts, she is unable to walk or stand due to severe back pain. Physical therapy has just started, but she has not seen significant improvement yet.  She has a history of taking prednisone , which was reportedly not discontinued as advised by her previous doctor. She is currently not taking prednisone . She is also on Fosamax  for bone health, but reports issues with urination, stating 'some days I can go and some days I can't'.  She has chronic constipation, having bowel movements only once a week, and is using Miralax  and lactulose  to manage this. She has been prescribed Linzess  for constipation.  She has a history of mood issues and is currently taking duloxetine . She is also taking thyroid  medication and diuretics as needed for swelling.  She reports a history of hip pain and has been in and out of the hospital and rehabilitation facilities due to her back issues. She experienced a setback when she fractured another vertebrae while trying to use the bathroom at a nursing home.        Review of Systems     Objective:   Physical Exam  General-in  no acute distress Eyes-no discharge Lungs-respiratory rate normal, CTA CV-no murmurs,RRR Extremities skin warm dry no edema Neuro grossly normal Behavior normal, alert Patient laying on the electronic bed unable to sit up complains of pain in her back discomfort hurts to move      Assessment & Plan:  Vertebral Compression Fractures Chronic fractures causing significant pain and mobility issues. Kyphoplasty not pursued due to surgical risks. Pain managed with oxycodone . Mobility severely limited. Second opinion from back specialist considered. - Continue home physical therapy. - Follow up with Dr. Burnetta for orthopedic evaluation. - Consider referral to a back specialist at [facility]. - Manage pain with oxycodone  15 mg every 4 hours as needed. In regards to her pain medicine this is through [facility] we no longer prescribe her pain management here I did advise her to stay away from muscle relaxers to minimize the risk of significant interaction  Osteoporosis Osteoporosis with vertebral fractures. On alendronate . Discussed potential switch to Prolia pending insurance approval. - Continue alendronate  as prescribed. - Consult with [name] regarding potential switch to Prolia.  I will send him a message regarding this  Chronic Constipation Chronic constipation worsened by immobility and opioid use. Current regimen includes Miralax , lactulose , and Linzess . Linzess  dose adjustment planned. - Increase Linzess  to 145 mcg once daily when available. - Continue Miralax  and lactulose  as needed.  Urinary Retention Intermittent urinary retention with variable symptoms.  Depression and Anxiety Managed with duloxetine  and buspirone . Citalopram  discontinued. Counseling services considered. Topamax  not recommended. - Continue  duloxetine  and buspirone . - Discontinue citalopram . - Explore telehealth counseling services. I do not recommend hydroxyzine   Patient to follow-up in 3  months  Morbid obesity very difficult for her to take care because problem keeps her from being mobile healthy diet recommended

## 2024-03-09 NOTE — Telephone Encounter (Signed)
 Pt wants to know if this appt 03/17/24 can be done as a virtual visit due to her back.

## 2024-03-16 ENCOUNTER — Other Ambulatory Visit: Payer: Self-pay | Admitting: "Endocrinology

## 2024-03-16 DIAGNOSIS — R0602 Shortness of breath: Secondary | ICD-10-CM | POA: Diagnosis not present

## 2024-03-16 DIAGNOSIS — D860 Sarcoidosis of lung: Secondary | ICD-10-CM | POA: Diagnosis not present

## 2024-03-16 DIAGNOSIS — H10413 Chronic giant papillary conjunctivitis, bilateral: Secondary | ICD-10-CM | POA: Diagnosis not present

## 2024-03-16 DIAGNOSIS — J849 Interstitial pulmonary disease, unspecified: Secondary | ICD-10-CM | POA: Diagnosis not present

## 2024-03-16 DIAGNOSIS — H04123 Dry eye syndrome of bilateral lacrimal glands: Secondary | ICD-10-CM | POA: Diagnosis not present

## 2024-03-17 ENCOUNTER — Other Ambulatory Visit (HOSPITAL_COMMUNITY): Payer: Self-pay

## 2024-03-17 ENCOUNTER — Encounter: Payer: Self-pay | Admitting: "Endocrinology

## 2024-03-17 ENCOUNTER — Ambulatory Visit: Admitting: "Endocrinology

## 2024-03-17 VITALS — BP 122/54 | HR 80

## 2024-03-17 DIAGNOSIS — E2740 Unspecified adrenocortical insufficiency: Secondary | ICD-10-CM | POA: Diagnosis not present

## 2024-03-17 DIAGNOSIS — E039 Hypothyroidism, unspecified: Secondary | ICD-10-CM | POA: Diagnosis not present

## 2024-03-17 DIAGNOSIS — R7303 Prediabetes: Secondary | ICD-10-CM | POA: Diagnosis not present

## 2024-03-17 DIAGNOSIS — M8000XA Age-related osteoporosis with current pathological fracture, unspecified site, initial encounter for fracture: Secondary | ICD-10-CM | POA: Diagnosis not present

## 2024-03-17 DIAGNOSIS — E782 Mixed hyperlipidemia: Secondary | ICD-10-CM | POA: Diagnosis not present

## 2024-03-17 LAB — POCT GLYCOSYLATED HEMOGLOBIN (HGB A1C): HbA1c, POC (controlled diabetic range): 5.9 % (ref 0.0–7.0)

## 2024-03-17 MED ORDER — CALCIUM CITRATE 333 MG PO TABS
ORAL_TABLET | Freq: Three times a day (TID) | ORAL | 1 refills | Status: AC
  Filled 2024-03-17: qty 270, 90d supply, fill #0

## 2024-03-17 MED ORDER — TOPIRAMATE 50 MG PO TABS
50.0000 mg | ORAL_TABLET | Freq: Every day | ORAL | 1 refills | Status: AC
  Filled 2024-03-17: qty 90, 90d supply, fill #0
  Filled 2024-05-02 – 2024-06-16 (×2): qty 90, 90d supply, fill #1

## 2024-03-17 MED ORDER — METHYLPREDNISOLONE NA SUC (PF) 125 MG IJ SOLR
125.0000 mg | Freq: Once | INTRAMUSCULAR | 0 refills | Status: AC
  Filled 2024-03-17: qty 1, 1d supply, fill #0

## 2024-03-17 NOTE — Progress Notes (Signed)
 03/17/2024, 7:38 PM     Endocrinology follow-up note  Subjective:    Patient ID: Kathleen [name], female    DOB: 1961/04/27, PCP Alphonsa Glendia LABOR, MD   Past Medical History:  Diagnosis Date   Adrenal insufficiency (HCC)    Anemia    Anxiety    Avascular bone necrosis (HCC)    Breast fibrocystic disorder    Chronic pain    Encounter for long-term opiate analgesic use 02/02/2020   Patient has chronic pain with femoral head avascular necrosis.  Is under pain contract   Fibromyalgia    Gastroesophageal reflux disease    Hypothyroidism    Mitral valve prolapse    Orthostatic hypotension    Peripheral neuropathy    PONV (postoperative nausea and vomiting)    Restrictive lung disease    Moderate to severe   Sarcoidosis    Biopsy proven - [facility]   Sarcoidosis    SOB (shortness of breath)    chronic   Past Surgical History:  Procedure Laterality Date   BIOPSY  10/17/2018   Procedure: BIOPSY;  Surgeon: Golda Claudis PENNER, MD;  [facility];  Service: Endoscopy;;  duodenum, antrum, gastric body   BIOPSY  03/03/2021   Procedure: BIOPSY;  Surgeon: Golda Claudis PENNER, MD;  [facility]: [facility];  Service: Endoscopy;;   BIOPSY  09/11/2023   Procedure: BIOPSY;  Surgeon: Shaaron Lamar HERO, MD;  [facility];  Service: Endoscopy;;   BREAST LUMPECTOMY Bilateral 01/12/2011   CHOLECYSTECTOMY N/A 04/17/2021   Procedure: LAPAROSCOPIC CHOLECYSTECTOMY;  Surgeon: Mavis Anes, MD;  Location: [facility];  Service: General;  Laterality: N/A;   COLONOSCOPY WITH PROPOFOL  N/A 01/03/2022   Procedure: COLONOSCOPY WITH PROPOFOL ;  Surgeon: Golda Claudis PENNER, MD;  [facility];  Service: Endoscopy;  Laterality: N/A;  220   ESOPHAGEAL DILATION N/A 03/03/2021   Procedure: ESOPHAGEAL DILATION;  Surgeon: Golda Claudis PENNER, MD;  [facility];  Service: Endoscopy;  Laterality: N/A;   ESOPHAGOGASTRODUODENOSCOPY (EGD) WITH  PROPOFOL  N/A 10/17/2018   Procedure: ESOPHAGOGASTRODUODENOSCOPY (EGD) WITH PROPOFOL ;  Surgeon: Golda Claudis PENNER, MD;  [facility];  Service: Endoscopy;  Laterality: N/A;  2:25   ESOPHAGOGASTRODUODENOSCOPY (EGD) WITH PROPOFOL  N/A 03/03/2021   Procedure: ESOPHAGOGASTRODUODENOSCOPY (EGD) WITH PROPOFOL ;  Surgeon: Golda Claudis PENNER, MD;  [facility];  Service: Endoscopy;  Laterality: N/A;  12:15   ESOPHAGOGASTRODUODENOSCOPY (EGD) WITH PROPOFOL  N/A 09/11/2023   Procedure: ESOPHAGOGASTRODUODENOSCOPY (EGD) WITH PROPOFOL ;  Surgeon: Shaaron Lamar HERO, MD;  [facility];  Service: Endoscopy;  Laterality: N/A;  2:30 pm, asa 3, LM to see if pt can come earlier   [employer] DILATION N/A 09/11/2023   Procedure: AGAPITO DILATION;  Surgeon: Shaaron Lamar HERO, MD;  [facility];  Service: Endoscopy;  Laterality: N/A;   ORIF ANKLE FRACTURE Left 03/10/2023   Procedure: OPEN REDUCTION INTERNAL FIXATION (ORIF) ANKLE FRACTURE;  Surgeon: Georgina Ozell LABOR, MD;  Location: [facility];  Service: Orthopedics;  Laterality: Left;   POLYPECTOMY  01/03/2022   Procedure: POLYPECTOMY INTESTINAL;  Surgeon: Golda Claudis PENNER, MD;  [facility];  Service: Endoscopy;;   TUBAL LIGATION     Social History  Socioeconomic History   Marital status: Married    Spouse name: Not on file   Number of children: 1   Years of education: Not on file   Highest education level: Not on file  Occupational History   Occupation: Midwife Payable    Employer: [employer]  Tobacco Use   Smoking status: Never    Passive exposure: Never   Smokeless tobacco: Never  Vaping Use   Vaping status: Never Used  Substance and Sexual Activity   Alcohol use: No   Drug use: No   Sexual activity: Yes    Birth control/protection: Post-menopausal  Other Topics Concern   Not on file  Social History Narrative   Daily caffeine    Living with husband   Right handed   She has been on disability since 2014.     Social Drivers of Corporate investment banker Strain: Low Risk  (09/27/2023)   Overall Financial Resource Strain (CARDIA)    Difficulty of Paying Living Expenses: Not hard at all  Food Insecurity: No Food Insecurity (01/26/2024)   Hunger Vital Sign    Worried About Running Out of Food in the Last Year: Never true    Ran Out of Food in the Last Year: Never true  Transportation Needs: No Transportation Needs (01/26/2024)   PRAPARE - Administrator, Civil Service (Medical): No    Lack of Transportation (Non-Medical): No  Physical Activity: Inactive (09/27/2023)   Exercise Vital Sign    Days of Exercise per Week: 0 days    Minutes of Exercise per Session: 0 min  Stress: No Stress Concern Present (09/27/2023)   [employer] of Occupational Health - Occupational Stress Questionnaire    Feeling of Stress : Only a little  Social Connections: Socially Integrated (01/21/2024)   Social Connection and Isolation Panel    Frequency of Communication with Friends and Family: More than three times a week    Frequency of Social Gatherings with Friends and Family: More than three times a week    Attends Religious Services: More than 4 times per year    Active Member of [employer] or Organizations: Yes    Attends Engineer, structural: More than 4 times per year    Marital Status: Married   Family History  Problem Relation Age of Onset   Cardiomyopathy Mother    Breast cancer Mother    Prostate cancer Father    Heart attack Maternal Grandmother    Heart attack Maternal Grandfather    Heart attack Paternal Grandmother    Bipolar disorder Daughter    Heart disease Maternal Aunt    Neurofibromatosis Cousin    Colon cancer Neg Hx    Outpatient Encounter Medications as of 03/17/2024  Medication Sig   acamprosate  (CAMPRAL ) 333 MG tablet Take by mouth 3 (three) times daily with meals.   methylPREDNISolone  sodium succinate  (SOLU-MEDROL ) 125 MG injection ACT-O-VIAL Inject 2 mLs (125 mg  total) into the vein once for 1 dose.   topiramate  (TOPAMAX ) 50 MG tablet Take 1 tablet (50 mg total) by mouth daily with lunch.   acetaminophen  (TYLENOL ) 325 MG tablet Take 2 tablets (650 mg total) by mouth every 6 (six) hours as needed for mild pain (pain score 1-3) or fever (or Fever >/= 101).   albuterol  (PROVENTIL ) (2.5 MG/3ML) 0.083% nebulizer solution Inhale 3 mL (2.5 mg total) by nebulization every four (4) hours as needed for wheezing or shortness of breath.   alendronate  (FOSAMAX ) 70  MG tablet Take 1 tablet (70 mg total) by mouth every Saturday. Take with a full glass of water  on an empty stomach.   budesonide  (PULMICORT ) 0.5 MG/2ML nebulizer solution Inhale the contents of 1 vial via nebulizer two (2) times a day.   busPIRone  (BUSPAR ) 15 MG tablet Take 1 tablet (15 mg) by mouth 3 times daily.   cyanocobalamin  (VITAMIN B12) 1000 MCG/ML injection Inject 1 mL (1,000 mcg total) into the skin daily.   DULoxetine  (CYMBALTA ) 60 MG capsule Take 1 capsule (60 mg total) by mouth daily.   ferrous sulfate  325 (65 FE) MG tablet Take 1 tablet (325 mg total) by mouth once a week.   folic acid  (FOLVITE ) 1 MG tablet Take 1 tablet (1 mg total) by mouth daily.   furosemide  (LASIX ) 20 MG tablet Take 1 tablet by mouth on Monday, Wednesday and Friday as needed for pedal edema   hydrocortisone  (CORTEF ) 10 MG tablet Take 2 tablets by mouth at 8:00 am and 1 tablet at lunch. (May double dose on sick days x 3-5 days)   ipratropium-albuterol  (DUONEB) 0.5-2.5 (3) MG/3ML SOLN Take 3 mLs by nebulization every 8 (eight) hours.   levothyroxine  (SYNTHROID ) 50 MCG tablet Take 1 tablet by mouth daily before breakfast.   lidocaine  (LIDODERM ) 5 % Place 1 patch onto the skin daily. Remove & Discard patch within 12 hours or as directed by MD   linaclotide  (LINZESS ) 145 MCG CAPS capsule Take 1 capsule (145 mcg total) by mouth daily before breakfast.   midodrine  (PROAMATINE ) 5 MG tablet Take 1 tablet (5 mg total) by mouth 2 (two)  times daily with a meal.   oxyCODONE  (ROXICODONE ) 15 MG immediate release tablet Take 1 tablet (15 mg total) by mouth every 4 -6(four-six) hours as needed for pain   Oxycodone  HCl 10 MG TABS Take 2 tablets (20 mg total) by mouth every 4 (four) hours as needed (severe pain >level 7 after MS contin  started).   OXYGEN  Inhale 5 L into the lungs continuous.   pantoprazole  (PROTONIX ) 40 MG tablet Take 1 tablet (40 mg total) by mouth 2 (two) times daily.   polyethylene glycol (MIRALAX  / GLYCOLAX ) 17 g packet Take 17 g by mouth 2 (two) times daily.   revefenacin  (YUPELRI ) 175 MCG/3ML nebulizer solution Take 3 mLs (175 mcg total) by nebulization daily.   rosuvastatin  (CRESTOR ) 20 MG tablet Take 1 tablet (20 mg total) by mouth daily.   senna-docusate (SENOKOT-S) 8.6-50 MG tablet Take 2 tablets by mouth at bedtime.   tamsulosin  (FLOMAX ) 0.4 MG CAPS capsule Take 2 capsules (0.8 mg total) by mouth daily after supper.   Vitamin D , Ergocalciferol , (DRISDOL ) 1.25 MG (50000 UNIT) CAPS capsule Take 1 capsule (50,000 Units total) by mouth every Sunday.   No facility-administered encounter medications on file as of 03/17/2024.   ALLERGIES: Allergies  Allergen Reactions   Bee Venom Anaphylaxis   Contrast Media [Iodinated Contrast Media] Other (See Comments)    CT chest w/ contrast at OSH followed by SOB resolved w/ single dose steroids, never ENT swelling, intubation or pressors, has had multiple contrasted scans since   Bactrim  [Sulfamethoxazole -Trimethoprim ] Nausea And Vomiting   Codeine Itching    VACCINATION STATUS: Immunization History  Administered Date(s) Administered   Influenza Whole 06/27/2010, 07/28/2011   Influenza-Unspecified 05/27/2013   Moderna Sars-Covid-2 Vaccination 10/27/2019, 11/27/2019   Pneumococcal Conjugate-13 06/15/2014   Pneumococcal Polysaccharide-23 06/10/2013    HPI Jachelle R Maietta is 63 y.o. female who is being seen in follow-up  in the management of adrenal insufficiency,  hypothyroidism, and her recent diagnosis of osteoporosis after multiple vertebral as well as pelvic compression fractures. CT scan performed on May 27 showed mild wedge compression fracture of L1 superior endplate with approximately 10% height loss.  Chronic compression deformities of L3 and L4 which was also previously noticed on Jan 03, 2024.  CT lumbar spine done on Jan 25, 2024 showed stable superior endplate fractures of L1, L3 and L4.  She did not have recent DEXA scan. She was not offered surgical treatment, started on alendronate  70 mg p.o. weekly.  She has history of GERD, however tolerating Fosamax . She was already limited in mobility through a wheelchair.  She has researched and would like to switch her treatment to Prolia. -She has long and complicated medical history including sarcoidosis because of which she had multiple years of exposure to steroids. 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    -She is on a stable dose of steroids including hydrocortisone  20 mg p.o. daily in the morning, 10 mg p.o. daily at noon.    She is taken off of prednisone  in the interval by her pulmonologist.  She remains on continuous oxygen  supplement via nasal cannula.    She has no new complaints today except that her insurance did not cover GLP-1 receptor agonist for weight control.  She is asking for Topamax  to help her with his effort.    For her hypothyroidism, she is on levothyroxine  50 mcg p.o. daily before breakfast. -She has significant cognitive deficit.  - The details of her diagnosis with adrenal insufficiency  is not available to review. -She is not wearing her medic alert today.    -She denies family history of adrenal, pituitary dysfunction/syndromes. She has family history of hypothyroidism in one sibling. - Denies any history of intra-abdominal injury. She is on hydrocodone  for chronic pain and inhaled bronchodilators.   Her recent labs show mild  hypocalcemia of 8.3-8.8, also has hypoalbuminemia at 2.7.  Review of Systems Limited as above.  Objective:    BP (!) 122/54   Pulse 80   LMP 05/17/2016   Wt Readings from Last 3 Encounters:  02/21/24 278 lb 11.2 oz (126.4 kg)  01/25/24 277 lb (125.6 kg)  01/23/24 288 lb 9.6 oz (130.9 kg)    Physical Exam    CMP ( most recent) CMP     Component Value Date/Time   NA 140 02/24/2024 0520   NA 140 01/24/2024 1419   K 4.0 02/24/2024 0520   CL 100 02/24/2024 0520   CO2 30 02/24/2024 0520   GLUCOSE 104 (H) 02/24/2024 0520   BUN <5 (L) 02/24/2024 0520   BUN 8 01/24/2024 1419   CREATININE 0.74 02/24/2024 0520   CREATININE 0.85 08/26/2014 0831   CALCIUM  8.3 (L) 02/24/2024 0520   PROT 5.4 (L) 02/07/2024 0735   PROT 5.8 (L) 01/24/2024 1419   ALBUMIN 2.7 (L) 02/07/2024 0735   ALBUMIN 3.8 (L) 01/24/2024 1419   AST 23 02/07/2024 0735   ALT 25 02/07/2024 0735   ALKPHOS 89 02/07/2024 0735   BILITOT 0.6 02/07/2024 0735   BILITOT 0.2 01/24/2024 1419   GFRNONAA >60 02/24/2024 0520   GFRAA 92 05/24/2020 1038     Diabetic Labs (most recent): Lab Results  Component Value Date   HGBA1C 5.9 03/17/2024   HGBA1C 6.2 (H) 07/26/2023   HGBA1C 5.4 08/04/2021     Lipid Panel ( most recent) Lipid Panel  Component Value Date/Time   CHOL 115 01/24/2024 1419   TRIG 133 01/24/2024 1419   HDL 55 01/24/2024 1419   CHOLHDL 2.1 01/24/2024 1419   CHOLHDL 4.0 08/26/2014 0831   VLDL 31 08/26/2014 0831   LDLCALC 37 01/24/2024 1419      Lab Results  Component Value Date   TSH 1.060 01/24/2024   TSH 0.879 10/25/2023   TSH 0.661 06/02/2023   TSH 0.449 (L) 04/23/2023   TSH 0.715 09/26/2022   TSH 0.668 03/16/2022   TSH 0.088 (L) 09/14/2021   TSH 0.668 05/08/2021   TSH 0.307 (L) 11/09/2020   TSH 0.548 05/24/2020   FREET4 1.25 01/24/2024   FREET4 1.19 10/25/2023   FREET4 1.02 06/02/2023   FREET4 1.16 04/23/2023   FREET4 1.11 09/26/2022   FREET4 1.08 03/16/2022   FREET4 1.71  09/14/2021   FREET4 1.06 05/08/2021   FREET4 1.32 11/09/2020   FREET4 1.46 05/24/2020      Assessment & Plan:   Severe osteoporosis with fracture complications: She presents with multiple vertebral compression fractures as well as pelvic fracture. She does not have recent DEXA scan.  It is clear that she would benefit from a stronger antiosteoporosis intervention.  This patient is at particularly high risk for recurrent fractures. She would have benefited more from romosozumab, however this patient is high risk for cardiovascular disease and this product is not on formulary with her insurance plan. Anabolic agents including Forteo lack evidence for hip benefit. She has researched and asking for Prolia.     I have discussed the pros and cons of 3 medications with her including Forteo, denosumab and romosozumab. Considering her comfort, ease of compliance and follow-up, safety and still decent gain of approximately 9% on spine, 6% on hip and from the point of view of benefit from vertebra, non-vertebral and hip fractures denosumab  seems to be reasonable choice even though it would be slightly inferior as far as bone mineral density gain compared to romosozumab. She would be initiated on Prolia as soon as it is procured for her and every 6 months thereafter. I advised her to expect a local treatment,  up to  5 -10 years  and  I discussed with her the risk of atypical femur rebound fracture from withdrawal of Prolia without appropriate backup and the need for transitioning to bisphosphonates.   Please note her corrected calcium  is within normal range, however she will  benefit from calcium  supplements.  I discussed and prescribed calcium  citrate 333 mg 3 times daily with meals.  Given her history of GERD on PPI, she may not absorb calcium  carbonate optimally.  2. Adrenal insufficiency (HCC) -I have reviewed her new set of labs, available records and clinically evaluated this patient. She does  have multiple risk factors for adrenal insufficiency  including exposure to high-dose steroids related to her sarcoidosis , as well as opioid therapy related to diffuse pain.  -She is on a stable dose of hydrocortisone  20 mg p.o. daily at 8 AM and 10 mg p.o. daily at noon.   - In the interval, she was advised to stop the extra prednisone  that she was getting. -I advised her to maintain close follow-up with her pulmonologist in the care of her her current sarcoidosis.   I had a lengthy discussion with her about the need to avoid any further unnecessary escalation on steroid replacement in the interest of avoiding additional secondary causes of osteoporosis. - She is advised to wear a medic alert  depicting her need for steroids. She will not be considered for work-up by withdrawing steroids supports,  risk of adrenal crisis.   -She does not have enough support to perform steroid injection at home.  She wishes to have an emergency Solu-Medrol  IV kit which I discussed and prescribed for her. -She is advised to go to ER if she cannot keep her medications down due to intractable vomiting. - She will be evaluated for the need for mineralocorticoid replacement which may help to stabilize her blood pressure.    - I had a long discussion with her regarding steroids sick day rules, risk of unintended weight gain/diabetes and the need for periodic screening with A1c.  3) obesity/prediabetes  Her point-of-care A1c today is 5.9% improving from 6.2%, still consistent with prediabetes.    High risk for type 2 diabetes very high relation to her chronic exposure to high-dose steroids.   She would like to have prescription for Topamax .  She does not have contraindication, I discussed and prescribed Topamax  50 mg p.o. daily at lunch for her.  She did not get coverage for GLP-1 receptor agonist for weight loss.   4. Hypothyroidism:  -Her recent labs for thyroid  function are consistent with appropriate replacement.   She is advised to continue levothyroxine  50 mcg p.o. daily before breakfast.     - We discussed about the correct intake of her thyroid  hormone, on empty stomach at fasting, with water , separated by at least 30 minutes from breakfast and other medications,  and separated by more than 4 hours from calcium , iron , multivitamins, acid reflux medications (PPIs). -Patient is made aware of the fact that thyroid  hormone replacement is needed for life, dose to be adjusted by periodic monitoring of thyroid  function tests.   She has severe hyperlipidemia, responded to Crestor  20 mg p.o. nightly.   She is advised to continue same dose.  Side effects and precautions discussed with her.     - I advised her  to maintain close follow up with Alphonsa Glendia LABOR, MD for primary care needs.   I spent  42 minutes in the care of the patient today including review of labs from Thyroid  Function, CMP, and other relevant labs ; imaging/biopsy records (current and previous including abstractions from other facilities); face-to-face time discussing  her lab results and symptoms, medications doses, her options of short and long term treatment based on the latest standards of care / guidelines;   and documenting the encounter.  Madelin JONELLE Collum  participated in the discussions, expressed understanding, and voiced agreement with the above plans.  All questions were answered to her satisfaction. she is encouraged to contact clinic should she have any questions or concerns prior to her return visit.   Follow up plan: Return in about 6 months (around 09/17/2024), or Labs in 10 days, for Prolia Today (ASAP) and Prolia NV.   Ranny Earl, MD [facility] [address] [place], [place] [address] Phone: [phone]  Fax: [phone]     03/17/2024, 7:38 PM  This note was partially dictated with voice recognition software. Similar sounding words can be transcribed inadequately  or may not  be corrected upon review.

## 2024-03-18 ENCOUNTER — Other Ambulatory Visit (HOSPITAL_COMMUNITY): Payer: Self-pay

## 2024-03-19 ENCOUNTER — Other Ambulatory Visit (HOSPITAL_COMMUNITY): Payer: Self-pay

## 2024-03-23 ENCOUNTER — Other Ambulatory Visit: Payer: Self-pay

## 2024-03-23 ENCOUNTER — Other Ambulatory Visit (HOSPITAL_COMMUNITY): Payer: Self-pay

## 2024-03-23 ENCOUNTER — Other Ambulatory Visit: Payer: Self-pay | Admitting: Family Medicine

## 2024-03-23 MED ORDER — FOLIC ACID 1 MG PO TABS
1.0000 mg | ORAL_TABLET | Freq: Every day | ORAL | 0 refills | Status: DC
Start: 2024-03-23 — End: 2024-05-02
  Filled 2024-03-23: qty 30, 30d supply, fill #0

## 2024-03-23 MED ORDER — FERROUS SULFATE 325 (65 FE) MG PO TABS
325.0000 mg | ORAL_TABLET | ORAL | 0 refills | Status: DC
  Filled 2024-03-23: qty 4, 28d supply, fill #0

## 2024-03-25 ENCOUNTER — Other Ambulatory Visit (HOSPITAL_COMMUNITY): Payer: Self-pay

## 2024-03-26 ENCOUNTER — Other Ambulatory Visit: Payer: Self-pay

## 2024-03-26 ENCOUNTER — Other Ambulatory Visit (HOSPITAL_COMMUNITY): Payer: Self-pay

## 2024-03-26 ENCOUNTER — Other Ambulatory Visit: Payer: Self-pay | Admitting: Family Medicine

## 2024-03-27 ENCOUNTER — Other Ambulatory Visit (HOSPITAL_COMMUNITY): Payer: Self-pay

## 2024-03-27 DIAGNOSIS — J984 Other disorders of lung: Secondary | ICD-10-CM | POA: Diagnosis not present

## 2024-03-27 DIAGNOSIS — Z79899 Other long term (current) drug therapy: Secondary | ICD-10-CM | POA: Diagnosis not present

## 2024-03-27 DIAGNOSIS — M810 Age-related osteoporosis without current pathological fracture: Secondary | ICD-10-CM | POA: Diagnosis not present

## 2024-03-27 DIAGNOSIS — S32000S Wedge compression fracture of unspecified lumbar vertebra, sequela: Secondary | ICD-10-CM | POA: Diagnosis not present

## 2024-03-27 DIAGNOSIS — Z6836 Body mass index (BMI) 36.0-36.9, adult: Secondary | ICD-10-CM | POA: Diagnosis not present

## 2024-03-27 DIAGNOSIS — M545 Low back pain, unspecified: Secondary | ICD-10-CM | POA: Diagnosis not present

## 2024-03-27 MED ORDER — OXYCODONE HCL 15 MG PO TABS
15.0000 mg | ORAL_TABLET | ORAL | 0 refills | Status: DC | PRN
  Filled 2024-04-06: qty 126, 21d supply, fill #0

## 2024-03-28 ENCOUNTER — Other Ambulatory Visit (HOSPITAL_COMMUNITY): Payer: Self-pay

## 2024-03-30 ENCOUNTER — Other Ambulatory Visit (HOSPITAL_COMMUNITY): Payer: Self-pay

## 2024-03-31 ENCOUNTER — Other Ambulatory Visit (HOSPITAL_COMMUNITY): Payer: Self-pay

## 2024-03-31 ENCOUNTER — Other Ambulatory Visit: Payer: Self-pay | Admitting: Family Medicine

## 2024-04-01 ENCOUNTER — Other Ambulatory Visit (HOSPITAL_COMMUNITY): Payer: Self-pay

## 2024-04-02 ENCOUNTER — Other Ambulatory Visit (HOSPITAL_COMMUNITY): Payer: Self-pay

## 2024-04-02 ENCOUNTER — Other Ambulatory Visit: Payer: Self-pay | Admitting: Family Medicine

## 2024-04-03 ENCOUNTER — Other Ambulatory Visit: Payer: Self-pay | Admitting: Family Medicine

## 2024-04-03 ENCOUNTER — Other Ambulatory Visit (HOSPITAL_COMMUNITY): Payer: Self-pay

## 2024-04-06 ENCOUNTER — Other Ambulatory Visit: Payer: Self-pay | Admitting: Family Medicine

## 2024-04-06 ENCOUNTER — Other Ambulatory Visit (HOSPITAL_COMMUNITY): Payer: Self-pay

## 2024-04-07 ENCOUNTER — Other Ambulatory Visit (HOSPITAL_COMMUNITY): Payer: Self-pay

## 2024-04-07 MED ORDER — ONDANSETRON 8 MG PO TBDP
8.0000 mg | ORAL_TABLET | Freq: Three times a day (TID) | ORAL | 5 refills | Status: DC | PRN
  Filled 2024-04-07: qty 15, 5d supply, fill #0

## 2024-04-09 ENCOUNTER — Other Ambulatory Visit (HOSPITAL_COMMUNITY): Payer: Self-pay

## 2024-04-10 ENCOUNTER — Other Ambulatory Visit (HOSPITAL_COMMUNITY): Payer: Self-pay

## 2024-04-14 ENCOUNTER — Other Ambulatory Visit (HOSPITAL_COMMUNITY): Payer: Self-pay

## 2024-04-16 ENCOUNTER — Other Ambulatory Visit (HOSPITAL_COMMUNITY): Payer: Self-pay

## 2024-04-16 DIAGNOSIS — D860 Sarcoidosis of lung: Secondary | ICD-10-CM | POA: Diagnosis not present

## 2024-04-16 DIAGNOSIS — J849 Interstitial pulmonary disease, unspecified: Secondary | ICD-10-CM | POA: Diagnosis not present

## 2024-04-16 DIAGNOSIS — R0602 Shortness of breath: Secondary | ICD-10-CM | POA: Diagnosis not present

## 2024-04-16 MED FILL — Hydrocortisone Tab 10 MG: ORAL | 30 days supply | Qty: 120 | Fill #2 | Status: AC

## 2024-04-17 ENCOUNTER — Other Ambulatory Visit (HOSPITAL_COMMUNITY): Payer: Self-pay

## 2024-04-17 ENCOUNTER — Other Ambulatory Visit: Payer: Self-pay | Admitting: Family Medicine

## 2024-04-17 MED ORDER — FERROUS SULFATE 325 (65 FE) MG PO TABS
325.0000 mg | ORAL_TABLET | ORAL | 0 refills | Status: DC
  Filled 2024-04-17 – 2024-05-02 (×2): qty 4, 28d supply, fill #0

## 2024-04-18 ENCOUNTER — Other Ambulatory Visit (HOSPITAL_COMMUNITY): Payer: Self-pay

## 2024-04-21 ENCOUNTER — Other Ambulatory Visit (HOSPITAL_COMMUNITY): Payer: Self-pay

## 2024-04-23 ENCOUNTER — Other Ambulatory Visit: Payer: Self-pay

## 2024-04-23 ENCOUNTER — Other Ambulatory Visit (HOSPITAL_COMMUNITY): Payer: Self-pay

## 2024-04-23 ENCOUNTER — Encounter (HOSPITAL_COMMUNITY): Payer: Self-pay

## 2024-04-23 ENCOUNTER — Inpatient Hospital Stay (HOSPITAL_COMMUNITY)
Admission: EM | Admit: 2024-04-23 | Discharge: 2024-05-01 | DRG: 689 | Disposition: A | Discharge status: Home Health | Source: Ambulatory Visit | Attending: Family Medicine | Admitting: Family Medicine

## 2024-04-23 ENCOUNTER — Emergency Department (HOSPITAL_COMMUNITY)

## 2024-04-23 ENCOUNTER — Observation Stay (HOSPITAL_COMMUNITY)

## 2024-04-23 ENCOUNTER — Ambulatory Visit (INDEPENDENT_AMBULATORY_CARE_PROVIDER_SITE_OTHER): Admitting: Nurse Practitioner

## 2024-04-23 VITALS — BP 122/74 | HR 91 | Temp 97.3°F | Ht 70.0 in

## 2024-04-23 DIAGNOSIS — J9600 Acute respiratory failure, unspecified whether with hypoxia or hypercapnia: Secondary | ICD-10-CM | POA: Diagnosis not present

## 2024-04-23 DIAGNOSIS — M8000XA Age-related osteoporosis with current pathological fracture, unspecified site, initial encounter for fracture: Secondary | ICD-10-CM | POA: Diagnosis present

## 2024-04-23 DIAGNOSIS — N390 Urinary tract infection, site not specified: Secondary | ICD-10-CM | POA: Diagnosis not present

## 2024-04-23 DIAGNOSIS — R339 Retention of urine, unspecified: Secondary | ICD-10-CM

## 2024-04-23 DIAGNOSIS — A419 Sepsis, unspecified organism: Secondary | ICD-10-CM | POA: Diagnosis not present

## 2024-04-23 DIAGNOSIS — Z87440 Personal history of urinary (tract) infections: Secondary | ICD-10-CM

## 2024-04-23 DIAGNOSIS — F419 Anxiety disorder, unspecified: Secondary | ICD-10-CM | POA: Diagnosis present

## 2024-04-23 DIAGNOSIS — K5900 Constipation, unspecified: Secondary | ICD-10-CM | POA: Diagnosis present

## 2024-04-23 DIAGNOSIS — Z882 Allergy status to sulfonamides status: Secondary | ICD-10-CM

## 2024-04-23 DIAGNOSIS — Z79899 Other long term (current) drug therapy: Secondary | ICD-10-CM

## 2024-04-23 DIAGNOSIS — M797 Fibromyalgia: Secondary | ICD-10-CM | POA: Diagnosis present

## 2024-04-23 DIAGNOSIS — I272 Pulmonary hypertension, unspecified: Secondary | ICD-10-CM | POA: Diagnosis present

## 2024-04-23 DIAGNOSIS — Z91030 Bee allergy status: Secondary | ICD-10-CM

## 2024-04-23 DIAGNOSIS — Z7951 Long term (current) use of inhaled steroids: Secondary | ICD-10-CM

## 2024-04-23 DIAGNOSIS — Z91041 Radiographic dye allergy status: Secondary | ICD-10-CM

## 2024-04-23 DIAGNOSIS — J849 Interstitial pulmonary disease, unspecified: Secondary | ICD-10-CM | POA: Diagnosis present

## 2024-04-23 DIAGNOSIS — G894 Chronic pain syndrome: Secondary | ICD-10-CM | POA: Diagnosis present

## 2024-04-23 DIAGNOSIS — M8000XG Age-related osteoporosis with current pathological fracture, unspecified site, subsequent encounter for fracture with delayed healing: Secondary | ICD-10-CM | POA: Diagnosis not present

## 2024-04-23 DIAGNOSIS — I341 Nonrheumatic mitral (valve) prolapse: Secondary | ICD-10-CM | POA: Diagnosis present

## 2024-04-23 DIAGNOSIS — E876 Hypokalemia: Secondary | ICD-10-CM | POA: Diagnosis present

## 2024-04-23 DIAGNOSIS — Z803 Family history of malignant neoplasm of breast: Secondary | ICD-10-CM

## 2024-04-23 DIAGNOSIS — N3000 Acute cystitis without hematuria: Secondary | ICD-10-CM | POA: Diagnosis not present

## 2024-04-23 DIAGNOSIS — R600 Localized edema: Secondary | ICD-10-CM

## 2024-04-23 DIAGNOSIS — F32A Depression, unspecified: Secondary | ICD-10-CM | POA: Diagnosis present

## 2024-04-23 DIAGNOSIS — J9621 Acute and chronic respiratory failure with hypoxia: Secondary | ICD-10-CM | POA: Diagnosis not present

## 2024-04-23 DIAGNOSIS — R918 Other nonspecific abnormal finding of lung field: Secondary | ICD-10-CM | POA: Diagnosis not present

## 2024-04-23 DIAGNOSIS — K8689 Other specified diseases of pancreas: Secondary | ICD-10-CM | POA: Diagnosis not present

## 2024-04-23 DIAGNOSIS — E271 Primary adrenocortical insufficiency: Secondary | ICD-10-CM | POA: Diagnosis present

## 2024-04-23 DIAGNOSIS — Z7983 Long term (current) use of bisphosphonates: Secondary | ICD-10-CM

## 2024-04-23 DIAGNOSIS — K219 Gastro-esophageal reflux disease without esophagitis: Secondary | ICD-10-CM | POA: Diagnosis present

## 2024-04-23 DIAGNOSIS — J90 Pleural effusion, not elsewhere classified: Secondary | ICD-10-CM | POA: Diagnosis not present

## 2024-04-23 DIAGNOSIS — R0989 Other specified symptoms and signs involving the circulatory and respiratory systems: Secondary | ICD-10-CM | POA: Diagnosis not present

## 2024-04-23 DIAGNOSIS — E782 Mixed hyperlipidemia: Secondary | ICD-10-CM | POA: Diagnosis not present

## 2024-04-23 DIAGNOSIS — D869 Sarcoidosis, unspecified: Secondary | ICD-10-CM | POA: Diagnosis present

## 2024-04-23 DIAGNOSIS — Z6839 Body mass index (BMI) 39.0-39.9, adult: Secondary | ICD-10-CM

## 2024-04-23 DIAGNOSIS — D638 Anemia in other chronic diseases classified elsewhere: Secondary | ICD-10-CM | POA: Diagnosis present

## 2024-04-23 DIAGNOSIS — E559 Vitamin D deficiency, unspecified: Secondary | ICD-10-CM | POA: Diagnosis not present

## 2024-04-23 DIAGNOSIS — E8809 Other disorders of plasma-protein metabolism, not elsewhere classified: Secondary | ICD-10-CM | POA: Diagnosis present

## 2024-04-23 DIAGNOSIS — I10 Essential (primary) hypertension: Secondary | ICD-10-CM | POA: Diagnosis not present

## 2024-04-23 DIAGNOSIS — Z79890 Hormone replacement therapy (postmenopausal): Secondary | ICD-10-CM

## 2024-04-23 DIAGNOSIS — Z885 Allergy status to narcotic agent status: Secondary | ICD-10-CM

## 2024-04-23 DIAGNOSIS — R109 Unspecified abdominal pain: Secondary | ICD-10-CM | POA: Diagnosis not present

## 2024-04-23 DIAGNOSIS — G629 Polyneuropathy, unspecified: Secondary | ICD-10-CM | POA: Diagnosis present

## 2024-04-23 DIAGNOSIS — R0602 Shortness of breath: Secondary | ICD-10-CM | POA: Diagnosis not present

## 2024-04-23 DIAGNOSIS — Z8249 Family history of ischemic heart disease and other diseases of the circulatory system: Secondary | ICD-10-CM

## 2024-04-23 DIAGNOSIS — M87852 Other osteonecrosis, left femur: Secondary | ICD-10-CM | POA: Diagnosis present

## 2024-04-23 DIAGNOSIS — R160 Hepatomegaly, not elsewhere classified: Secondary | ICD-10-CM | POA: Diagnosis not present

## 2024-04-23 DIAGNOSIS — Z7952 Long term (current) use of systemic steroids: Secondary | ICD-10-CM

## 2024-04-23 DIAGNOSIS — M8088XD Other osteoporosis with current pathological fracture, vertebra(e), subsequent encounter for fracture with routine healing: Secondary | ICD-10-CM

## 2024-04-23 DIAGNOSIS — Z818 Family history of other mental and behavioral disorders: Secondary | ICD-10-CM

## 2024-04-23 DIAGNOSIS — K760 Fatty (change of) liver, not elsewhere classified: Secondary | ICD-10-CM | POA: Diagnosis not present

## 2024-04-23 DIAGNOSIS — Z9981 Dependence on supplemental oxygen: Secondary | ICD-10-CM

## 2024-04-23 DIAGNOSIS — I9589 Other hypotension: Secondary | ICD-10-CM | POA: Diagnosis present

## 2024-04-23 DIAGNOSIS — I5030 Unspecified diastolic (congestive) heart failure: Secondary | ICD-10-CM | POA: Diagnosis not present

## 2024-04-23 DIAGNOSIS — I5032 Chronic diastolic (congestive) heart failure: Secondary | ICD-10-CM | POA: Diagnosis present

## 2024-04-23 DIAGNOSIS — E039 Hypothyroidism, unspecified: Secondary | ICD-10-CM | POA: Diagnosis present

## 2024-04-23 LAB — CBC
HCT: 34.8 % — ABNORMAL LOW (ref 36.0–46.0)
Hemoglobin: 9.8 g/dL — ABNORMAL LOW (ref 12.0–15.0)
MCH: 26.5 pg (ref 26.0–34.0)
MCHC: 28.2 g/dL — ABNORMAL LOW (ref 30.0–36.0)
MCV: 94.1 fL (ref 80.0–100.0)
Platelets: 248 K/uL (ref 150–400)
RBC: 3.7 MIL/uL — ABNORMAL LOW (ref 3.87–5.11)
RDW: 17.2 % — ABNORMAL HIGH (ref 11.5–15.5)
WBC: 8.5 K/uL (ref 4.0–10.5)
nRBC: 0.2 % (ref 0.0–0.2)

## 2024-04-23 LAB — BASIC METABOLIC PANEL WITH GFR
Anion gap: 11 (ref 5–15)
BUN: 7 mg/dL — ABNORMAL LOW (ref 8–23)
CO2: 28 mmol/L (ref 22–32)
Calcium: 9.1 mg/dL (ref 8.9–10.3)
Chloride: 100 mmol/L (ref 98–111)
Creatinine, Ser: 0.74 mg/dL (ref 0.44–1.00)
GFR, Estimated: >60 mL/min (ref >60)
Glucose, Bld: 119 mg/dL — ABNORMAL HIGH (ref 70–99)
Potassium: 4.5 mmol/L (ref 3.5–5.1)
Sodium: 139 mmol/L (ref 135–145)

## 2024-04-23 LAB — URINALYSIS, ROUTINE W REFLEX MICROSCOPIC
Bilirubin Urine: NEGATIVE
Glucose, UA: NEGATIVE mg/dL
Ketones, ur: NEGATIVE mg/dL
Nitrite: POSITIVE — AB
Protein, ur: NEGATIVE mg/dL
Specific Gravity, Urine: 1.004 — ABNORMAL LOW (ref 1.005–1.030)
pH: 7 (ref 5.0–8.0)

## 2024-04-23 LAB — LIPASE, BLOOD: Lipase: 25 U/L (ref 11–51)

## 2024-04-23 LAB — HEPATIC FUNCTION PANEL
ALT: 21 U/L (ref 0–44)
AST: 27 U/L (ref 15–41)
Albumin: 2.9 g/dL — ABNORMAL LOW (ref 3.5–5.0)
Alkaline Phosphatase: 78 U/L (ref 38–126)
Bilirubin, Direct: <0.1 mg/dL (ref 0.0–0.2)
Total Bilirubin: 0.3 mg/dL (ref 0.0–1.2)
Total Protein: 6.2 g/dL — ABNORMAL LOW (ref 6.5–8.1)

## 2024-04-23 LAB — BRAIN NATRIURETIC PEPTIDE: B Natriuretic Peptide: 36 pg/mL (ref 0.0–100.0)

## 2024-04-23 MED ORDER — HYDROCORTISONE 10 MG PO TABS
10.0000 mg | ORAL_TABLET | Freq: Every day | ORAL | Status: DC
  Administered 2024-04-24 – 2024-05-01 (×8): 10 mg via ORAL
  Filled 2024-04-23 (×9): qty 1

## 2024-04-23 MED ORDER — FUROSEMIDE 10 MG/ML IJ SOLN
40.0000 mg | Freq: Once | INTRAMUSCULAR | Status: AC
  Administered 2024-04-23: 40 mg via INTRAVENOUS
  Filled 2024-04-23: qty 4

## 2024-04-23 MED ORDER — ONDANSETRON HCL 4 MG/2ML IJ SOLN
4.0000 mg | Freq: Four times a day (QID) | INTRAMUSCULAR | Status: DC | PRN
  Administered 2024-04-24 – 2024-05-01 (×13): 4 mg via INTRAVENOUS
  Filled 2024-04-23 (×14): qty 2

## 2024-04-23 MED ORDER — ALUM & MAG HYDROXIDE-SIMETH 200-200-20 MG/5ML PO SUSP
15.0000 mL | Freq: Four times a day (QID) | ORAL | Status: DC | PRN
  Administered 2024-04-23 – 2024-04-29 (×10): 15 mL via ORAL
  Filled 2024-04-23 (×10): qty 30

## 2024-04-23 MED ORDER — TOPIRAMATE 25 MG PO TABS
50.0000 mg | ORAL_TABLET | Freq: Every day | ORAL | Status: DC
  Administered 2024-04-24 – 2024-05-01 (×8): 50 mg via ORAL
  Filled 2024-04-23 (×8): qty 2

## 2024-04-23 MED ORDER — MIDODRINE HCL 5 MG PO TABS
5.0000 mg | ORAL_TABLET | Freq: Two times a day (BID) | ORAL | Status: DC
  Administered 2024-04-23: 5 mg via ORAL
  Filled 2024-04-23 (×2): qty 1

## 2024-04-23 MED ORDER — CALCIUM CARBONATE 1250 (500 CA) MG PO TABS
1.0000 | ORAL_TABLET | Freq: Three times a day (TID) | ORAL | Status: DC
  Administered 2024-04-24 – 2024-05-01 (×22): 1250 mg via ORAL
  Filled 2024-04-23 (×22): qty 1

## 2024-04-23 MED ORDER — HYDROCORTISONE SOD SUC (PF) 100 MG IJ SOLR
100.0000 mg | Freq: Once | INTRAMUSCULAR | Status: AC
  Administered 2024-04-23: 100 mg via INTRAVENOUS
  Filled 2024-04-23: qty 2

## 2024-04-23 MED ORDER — VITAMIN D (ERGOCALCIFEROL) 1.25 MG (50000 UNIT) PO CAPS
50000.0000 [IU] | ORAL_CAPSULE | ORAL | Status: DC
  Administered 2024-04-26: 50000 [IU] via ORAL
  Filled 2024-04-23 (×3): qty 1

## 2024-04-23 MED ORDER — TAMSULOSIN HCL 0.4 MG PO CAPS
0.8000 mg | ORAL_CAPSULE | Freq: Every day | ORAL | Status: DC
  Administered 2024-04-23 – 2024-04-30 (×8): 0.8 mg via ORAL
  Filled 2024-04-23 (×8): qty 2

## 2024-04-23 MED ORDER — HYDROCORTISONE 10 MG PO TABS
20.0000 mg | ORAL_TABLET | Freq: Every day | ORAL | Status: DC
  Administered 2024-04-24 – 2024-05-01 (×8): 20 mg via ORAL
  Filled 2024-04-23 (×9): qty 2

## 2024-04-23 MED ORDER — SENNOSIDES-DOCUSATE SODIUM 8.6-50 MG PO TABS
2.0000 | ORAL_TABLET | Freq: Every day | ORAL | Status: DC
  Administered 2024-04-23 – 2024-04-30 (×8): 2 via ORAL
  Filled 2024-04-23 (×8): qty 2

## 2024-04-23 MED ORDER — OXYCODONE HCL 5 MG PO TABS
15.0000 mg | ORAL_TABLET | ORAL | Status: DC | PRN
  Administered 2024-04-23 – 2024-05-01 (×38): 15 mg via ORAL
  Filled 2024-04-23 (×39): qty 3

## 2024-04-23 MED ORDER — REVEFENACIN 175 MCG/3ML IN SOLN: 175.0000 ug | Freq: Every day | RESPIRATORY_TRACT | Status: DC

## 2024-04-23 MED ORDER — PANTOPRAZOLE SODIUM 40 MG PO TBEC
40.0000 mg | DELAYED_RELEASE_TABLET | Freq: Two times a day (BID) | ORAL | Status: DC
  Administered 2024-04-23 – 2024-05-01 (×16): 40 mg via ORAL
  Filled 2024-04-23 (×16): qty 1

## 2024-04-23 MED ORDER — POLYETHYLENE GLYCOL 3350 17 G PO PACK
17.0000 g | PACK | Freq: Two times a day (BID) | ORAL | Status: DC
  Administered 2024-04-23 – 2024-05-01 (×10): 17 g via ORAL
  Filled 2024-04-23 (×16): qty 1

## 2024-04-23 MED ORDER — LINACLOTIDE 145 MCG PO CAPS
145.0000 ug | ORAL_CAPSULE | Freq: Every day | ORAL | Status: DC
  Administered 2024-04-24 – 2024-05-01 (×8): 145 ug via ORAL
  Filled 2024-04-23 (×8): qty 1

## 2024-04-23 MED ORDER — PIPERACILLIN-TAZOBACTAM 3.375 G IVPB
3.3750 g | Freq: Three times a day (TID) | INTRAVENOUS | Status: AC
  Administered 2024-04-23 – 2024-04-28 (×17): 3.375 g via INTRAVENOUS
  Filled 2024-04-23 (×17): qty 50

## 2024-04-23 MED ORDER — SODIUM CHLORIDE 0.9 % IV SOLN
1.0000 g | Freq: Once | INTRAVENOUS | Status: AC
  Administered 2024-04-23: 1 g via INTRAVENOUS
  Filled 2024-04-23: qty 10

## 2024-04-23 MED ORDER — FOLIC ACID 1 MG PO TABS
1.0000 mg | ORAL_TABLET | Freq: Every day | ORAL | Status: DC
  Administered 2024-04-24 – 2024-05-01 (×8): 1 mg via ORAL
  Filled 2024-04-23 (×8): qty 1

## 2024-04-23 MED ORDER — DULOXETINE HCL 60 MG PO CPEP
60.0000 mg | ORAL_CAPSULE | Freq: Every day | ORAL | Status: DC
  Administered 2024-04-23 – 2024-05-01 (×8): 60 mg via ORAL
  Filled 2024-04-23 (×9): qty 1

## 2024-04-23 MED ORDER — ENOXAPARIN SODIUM 40 MG/0.4ML IJ SOSY
40.0000 mg | PREFILLED_SYRINGE | INTRAMUSCULAR | Status: DC
  Administered 2024-04-23 – 2024-04-29 (×7): 40 mg via SUBCUTANEOUS
  Filled 2024-04-23 (×7): qty 0.4

## 2024-04-23 MED ORDER — REVEFENACIN 175 MCG/3ML IN SOLN
175.0000 ug | Freq: Every day | RESPIRATORY_TRACT | Status: DC
  Administered 2024-04-24 – 2024-05-01 (×8): 175 ug via RESPIRATORY_TRACT
  Filled 2024-04-23 (×9): qty 3

## 2024-04-23 MED ORDER — FERROUS SULFATE 325 (65 FE) MG PO TABS
325.0000 mg | ORAL_TABLET | ORAL | Status: DC
  Administered 2024-04-24 – 2024-05-01 (×2): 325 mg via ORAL
  Filled 2024-04-23 (×3): qty 1

## 2024-04-23 MED ORDER — BUSPIRONE HCL 15 MG PO TABS
15.0000 mg | ORAL_TABLET | Freq: Three times a day (TID) | ORAL | Status: DC
  Administered 2024-04-23 – 2024-05-01 (×24): 15 mg via ORAL
  Filled 2024-04-23 (×11): qty 1
  Filled 2024-04-23: qty 3
  Filled 2024-04-23 (×12): qty 1

## 2024-04-23 MED ORDER — LEVOTHYROXINE SODIUM 50 MCG PO TABS
50.0000 ug | ORAL_TABLET | Freq: Every day | ORAL | Status: DC
  Administered 2024-04-24 – 2024-05-01 (×8): 50 ug via ORAL
  Filled 2024-04-23 (×8): qty 1

## 2024-04-23 MED ORDER — ACETAMINOPHEN 325 MG PO TABS
650.0000 mg | ORAL_TABLET | Freq: Four times a day (QID) | ORAL | Status: DC | PRN
  Administered 2024-04-25 – 2024-04-30 (×3): 650 mg via ORAL
  Filled 2024-04-23 (×3): qty 2

## 2024-04-23 MED ORDER — ROSUVASTATIN CALCIUM 20 MG PO TABS
20.0000 mg | ORAL_TABLET | Freq: Every day | ORAL | Status: DC
  Administered 2024-04-24 – 2024-05-01 (×8): 20 mg via ORAL
  Filled 2024-04-23 (×8): qty 1

## 2024-04-23 MED ORDER — ALBUTEROL SULFATE (2.5 MG/3ML) 0.083% IN NEBU: 2.5000 mg | INHALATION_SOLUTION | RESPIRATORY_TRACT | Status: DC | PRN

## 2024-04-23 NOTE — H&P (Signed)
 [facility] H&P   Patient Demographics:    Kathleen [name], is a 63 y.o. female  MRN: [id number]   DOB - 11-Apr-1961  Admit Date - 04/23/2024  Outpatient Primary MD for the patient is Alphonsa Glendia LABOR, MD  Referring MD/NP/PA: Dr Francesca  Patient coming from: home  Chief Complaint  Patient presents with   Urinary Retention      HPI:   Kathleen [name]  is a 63 y.o. female, past medical history of interstitial lung disease, sarcoidosis, chronic respiratory failure with hypoxia on 5 L of oxygen  morbid obesity Addison's disease, bilateral hip avascular necrosis, lumbar back pain and lumbar compression fracture, status post recent hospitalization for kyphoplasty by IR and CIR discharge 02/25/2024. - Was sent by her PCP, as she does appear to be with worsening urinary retention and lower extremity edema, patient with urinary retention at baseline, husband assisting her with self cath in and out permanently, but it does appear she has been requiring it more often, will she is with worsening lower extremity edema. - In ED she was noted to have +3 lower extremity edema, she had Foley catheter inserted with 600 cc urine output, her UA is positive for pyuria, leukocytes and bacteriuria she is with history of Pseudomonas UTI (urine culture June/19/2025), her labs were significant for baseline hemoglobin at 9.8, chest x-ray with no acute findings, BNP within normal limit, CT abdomen pelvis with interval development of moderate compression deformity of L1, subacute to old fracture, and superior endplate depression L3, L4, L5 vertebral bodies concerning for subacute to old fractures.  [facility] hospitalist consulted to admit.    Review of systems:     A full 10 point Review of Systems was done, except as stated above, all other Review of Systems were negative.   With Past History of the following :    Past Medical  History:  Diagnosis Date   Adrenal insufficiency (HCC)    Anemia    Anxiety    Avascular bone necrosis (HCC)    Breast fibrocystic disorder    Chronic pain    Encounter for long-term opiate analgesic use 02/02/2020   Patient has chronic pain with femoral head avascular necrosis.  Is under pain contract   Fibromyalgia    Gastroesophageal reflux disease    Hypothyroidism    Mitral valve prolapse    Orthostatic hypotension    Peripheral neuropathy    PONV (postoperative nausea and vomiting)    Restrictive lung disease    Moderate to severe   Sarcoidosis    Biopsy proven - [facility]   Sarcoidosis    SOB (shortness of breath)    chronic      Past Surgical History:  Procedure Laterality Date   BIOPSY  10/17/2018   Procedure: BIOPSY;  Surgeon: Golda Claudis PENNER, MD;  [facility]: [facility];  Service: Endoscopy;;  duodenum, antrum, gastric body  BIOPSY  03/03/2021   Procedure: BIOPSY;  Surgeon: Golda Claudis PENNER, MD;  [facility]: [facility];  Service: Endoscopy;;   BIOPSY  09/11/2023   Procedure: BIOPSY;  Surgeon: Shaaron Lamar HERO, MD;  [facility]: [facility];  Service: Endoscopy;;   BREAST LUMPECTOMY Bilateral 01/12/2011   CHOLECYSTECTOMY N/A 04/17/2021   Procedure: LAPAROSCOPIC CHOLECYSTECTOMY;  Surgeon: Mavis Anes, MD;  Location: [facility];  Service: General;  Laterality: N/A;   COLONOSCOPY WITH PROPOFOL  N/A 01/03/2022   Procedure: COLONOSCOPY WITH PROPOFOL ;  Surgeon: Golda Claudis PENNER, MD;  [facility]: [facility];  Service: Endoscopy;  Laterality: N/A;  220   ESOPHAGEAL DILATION N/A 03/03/2021   Procedure: ESOPHAGEAL DILATION;  Surgeon: Golda Claudis PENNER, MD;  [facility];  Service: Endoscopy;  Laterality: N/A;   ESOPHAGOGASTRODUODENOSCOPY (EGD) WITH PROPOFOL  N/A 10/17/2018   Procedure: ESOPHAGOGASTRODUODENOSCOPY (EGD) WITH PROPOFOL ;  Surgeon: Golda Claudis PENNER, MD;  [facility];  Service: Endoscopy;  Laterality: N/A;  2:25   ESOPHAGOGASTRODUODENOSCOPY (EGD)  WITH PROPOFOL  N/A 03/03/2021   Procedure: ESOPHAGOGASTRODUODENOSCOPY (EGD) WITH PROPOFOL ;  Surgeon: Golda Claudis PENNER, MD;  [facility];  Service: Endoscopy;  Laterality: N/A;  12:15   ESOPHAGOGASTRODUODENOSCOPY (EGD) WITH PROPOFOL  N/A 09/11/2023   Procedure: ESOPHAGOGASTRODUODENOSCOPY (EGD) WITH PROPOFOL ;  Surgeon: Shaaron Lamar HERO, MD;  [facility];  Service: Endoscopy;  Laterality: N/A;  2:30 pm, asa 3, LM to see if pt can come earlier   [facility] DILATION N/A 09/11/2023   Procedure: AGAPITO DILATION;  Surgeon: Shaaron Lamar HERO, MD;  [facility]: [facility];  Service: Endoscopy;  Laterality: N/A;   ORIF ANKLE FRACTURE Left 03/10/2023   Procedure: OPEN REDUCTION INTERNAL FIXATION (ORIF) ANKLE FRACTURE;  Surgeon: Georgina Ozell LABOR, MD;  Location: [facility];  Service: Orthopedics;  Laterality: Left;   POLYPECTOMY  01/03/2022   Procedure: POLYPECTOMY INTESTINAL;  Surgeon: Golda Claudis PENNER, MD;  [facility];  Service: Endoscopy;;   TUBAL LIGATION        Social History:     Social History   Tobacco Use   Smoking status: Never    Passive exposure: Never   Smokeless tobacco: Never  Substance Use Topics   Alcohol use: No        Family History :     Family History  Problem Relation Age of Onset   Cardiomyopathy Mother    Breast cancer Mother    Prostate cancer Father    Heart attack Maternal Grandmother    Heart attack Maternal Grandfather    Heart attack Paternal Grandmother    Bipolar disorder Daughter    Heart disease Maternal Aunt    Neurofibromatosis Cousin    Colon cancer Neg Hx       Home Medications:   Prior to Admission medications   Medication Sig Start Date End Date Taking? Authorizing Provider  acetaminophen  (TYLENOL ) 325 MG tablet Take 2 tablets (650 mg total) by mouth every 6 (six) hours as needed for mild pain (pain score 1-3) or fever (or Fever >/= 101). 02/19/24   Angiulli, Toribio PARAS, PA-C  albuterol  (PROVENTIL ) (2.5 MG/3ML) 0.083%  nebulizer solution Inhale 3 mL (2.5 mg total) by nebulization every four (4) hours as needed for wheezing or shortness of breath. 01/23/24   Pearlean Manus, MD  alendronate  (FOSAMAX ) 70 MG tablet Take 1 tablet (70 mg total) by mouth every Saturday. Take with a full glass of water  on an empty stomach. 03/14/24   Alphonsa Glendia LABOR, MD  budesonide  (PULMICORT ) 0.5 MG/2ML nebulizer solution Inhale the contents of 1 vial via nebulizer two (2) times a day. 01/23/24   Pearlean Manus, MD  busPIRone  (BUSPAR ) 15 MG tablet Take 1 tablet (15 mg) by mouth 3 times daily. 03/09/24   Alphonsa Glendia LABOR, MD  Calcium  Citrate 333 MG TABS Take one tablet by mouth 3 (three) times daily with meals. 03/17/24   Nida, Gebreselassie W, MD  cyanocobalamin  (VITAMIN B12) 1000 MCG/ML injection Inject 1 mL (1,000 mcg total) into the skin daily. 02/07/24   Franchot Novel, MD  DULoxetine  (CYMBALTA ) 60 MG capsule Take 1 capsule (60 mg total) by mouth daily. 03/09/24   Alphonsa Glendia LABOR, MD  ferrous sulfate  (FEROSUL) 325 (65 FE) MG tablet Take 1 tablet (325 mg total) by mouth once a week. 04/17/24   Alphonsa Glendia LABOR, MD  folic acid  (FOLVITE ) 1 MG tablet Take 1 tablet (1 mg total) by mouth daily. 03/23/24   Alphonsa Glendia LABOR, MD  furosemide  (LASIX ) 20 MG tablet Take 1 tablet by mouth on Monday, Wednesday and Friday as needed for pedal edema 03/09/24   Alphonsa Glendia LABOR, MD  hydrocortisone  (CORTEF ) 10 MG tablet Take 2 tablets by mouth at 8:00 am and 1 tablet at lunch. (May double dose on sick days x 3-5 days) 11/14/23   Nida, Gebreselassie W, MD  ipratropium-albuterol  (DUONEB) 0.5-2.5 (3) MG/3ML SOLN Take 3 mLs by nebulization every 8 (eight) hours. 01/23/24   Pearlean Manus, MD  levothyroxine  (SYNTHROID ) 50 MCG tablet Take 1 tablet by mouth daily before breakfast. 03/09/24 09/05/24  Alphonsa Glendia LABOR, MD  lidocaine  (LIDODERM ) 5 % Place 1 patch onto the skin daily. Remove & Discard patch within 12 hours or as directed by MD 02/24/24   Angiulli, Toribio PARAS, PA-C   linaclotide  (LINZESS ) 145 MCG CAPS capsule Take 1 capsule (145 mcg total) by mouth daily before breakfast. 03/09/24   Alphonsa Glendia LABOR, MD  midodrine  (PROAMATINE ) 5 MG tablet Take 1 tablet (5 mg total) by mouth 2 (two) times daily with a meal. 03/09/24   Luking, Glendia LABOR, MD  ondansetron  (ZOFRAN -ODT) 8 MG disintegrating tablet Dissolve 1 tablet (8 mg total) by mouth every 8 (eight) hours as needed. 04/07/24 04/07/25  Alphonsa Glendia LABOR, MD  oxyCODONE  (ROXICODONE ) 15 MG immediate release tablet Take 1 tablet (15 mg total) by mouth every 4 (four) hours as needed for pain 04/04/24 03/27/24     Oxycodone  HCl 10 MG TABS Take 2 tablets (20 mg total) by mouth every 4 (four) hours as needed (severe pain >level 7 after MS contin  started). 02/24/24   Angiulli, Toribio PARAS, PA-C  OXYGEN  Inhale 5 L into the lungs continuous.    [provider]  pantoprazole  (PROTONIX ) 40 MG tablet Take 1 tablet (40 mg total) by mouth 2 (two) times daily. 03/09/24   Alphonsa Glendia LABOR, MD  polyethylene glycol (MIRALAX  / GLYCOLAX ) 17 g packet Take 17 g by mouth 2 (two) times daily. 01/23/24   Pearlean Manus, MD  revefenacin  (YUPELRI ) 175 MCG/3ML nebulizer solution Take 3 mLs (175 mcg total) by nebulization daily. 02/24/24   Angiulli, Toribio PARAS, PA-C  rosuvastatin  (CRESTOR ) 20 MG tablet Take 1 tablet (20 mg total) by mouth daily. 03/09/24   Alphonsa Glendia LABOR, MD  senna-docusate (SENOKOT-S) 8.6-50 MG tablet Take 2 tablets by mouth at bedtime. 01/23/24   Pearlean Manus, MD  tamsulosin  (FLOMAX ) 0.4 MG CAPS capsule Take 2 capsules (0.8 mg total) by mouth daily after supper. 03/09/24   Luking,  Glendia LABOR, MD  topiramate  (TOPAMAX ) 50 MG tablet Take 1 tablet (50 mg total) by mouth daily with lunch. 03/17/24   Lenis Ethelle ORN, MD  Vitamin D , Ergocalciferol , (DRISDOL ) 1.25 MG (50000 UNIT) CAPS capsule Take 1 capsule (50,000 Units total) by mouth every Sunday. 03/01/24   Angiulli, Toribio PARAS, PA-C     Allergies:     Allergies  Allergen Reactions    Bee Venom Anaphylaxis   Contrast Media [Iodinated Contrast Media] Other (See Comments)    CT chest w/ contrast at OSH followed by SOB resolved w/ single dose steroids, never ENT swelling, intubation or pressors, has had multiple contrasted scans since   Bactrim  [Sulfamethoxazole -Trimethoprim ] Nausea And Vomiting   Codeine Itching     Physical Exam:   Vitals  Blood pressure (!) 110/48, pulse 83, resp. rate 15, height 5' 10 (1.778 m), weight 108.9 kg, last menstrual period 05/17/2016, SpO2 100%.   1. General Chronically ill-appearing female female, laying in bed, no apparent distress, appears with morbid obesity  2. Normal affect and insight, Not Suicidal or Homicidal, Awake Alert, Oriented X 3.  3. No F.N deficits, ALL C.Nerves Intact, Strength 5/5 all 4 extremities, Sensation intact all 4 extremities, Plantars down going.  4. Ears and Eyes appear Normal, Conjunctivae clear, PERRLA. Moist Oral Mucosa.  5. Supple Neck, No Carotid Bruits.  6. Symmetrical Chest wall movement, manage air entry at the bases with scattered Rales  7. RRR, No Gallops, Rubs or Murmurs, No Parasternal Heave.  +3 edema bilaterally  8. Positive Bowel Sounds, Abdomen Soft, No tenderness, No organomegaly appriciated,No rebound -guarding or rigidity.  9.  No Cyanosis, Normal Skin Turgor, No Skin Rash or Bruise.  10. Good muscle tone,  joints appear normal , no effusions, Normal ROM.    Data Review:    CBC Recent Labs  Lab 04/23/24 1236  WBC 8.5  HGB 9.8*  HCT 34.8*  PLT 248  MCV 94.1  MCH 26.5  MCHC 28.2*  RDW 17.2*   ------------------------------------------------------------------------------------------------------------------  Chemistries  Recent Labs  Lab 04/23/24 1236  NA 139  K 4.5  CL 100  CO2 28  GLUCOSE 119*  BUN 7*  CREATININE 0.74  CALCIUM  9.1  AST 27  ALT 21  ALKPHOS 78  BILITOT 0.3    ------------------------------------------------------------------------------------------------------------------ estimated creatinine clearance is 98.7 mL/min (by C-G formula based on SCr of 0.74 mg/dL). ------------------------------------------------------------------------------------------------------------------ No results for input(s): TSH, T4TOTAL, T3FREE, THYROIDAB in the last 72 hours.  Invalid input(s): FREET3  Coagulation profile No results for input(s): INR, PROTIME in the last 168 hours. ------------------------------------------------------------------------------------------------------------------- No results for input(s): DDIMER in the last 72 hours. -------------------------------------------------------------------------------------------------------------------  Cardiac Enzymes No results for input(s): CKMB, TROPONINI, MYOGLOBIN in the last 168 hours.  Invalid input(s): CK ------------------------------------------------------------------------------------------------------------------    Component Value Date/Time   BNP 36.0 04/23/2024 1236     ---------------------------------------------------------------------------------------------------------------  Urinalysis    Component Value Date/Time   COLORURINE AMBER (A) 04/23/2024 1229   APPEARANCEUR CLEAR 04/23/2024 1229   LABSPEC 1.004 (L) 04/23/2024 1229   PHURINE 7.0 04/23/2024 1229   GLUCOSEU NEGATIVE 04/23/2024 1229   GLUCOSEU NEGATIVE 05/29/2010 1622   HGBUR SMALL (A) 04/23/2024 1229   BILIRUBINUR NEGATIVE 04/23/2024 1229   BILIRUBINUR + 11/08/2020 1604   KETONESUR NEGATIVE 04/23/2024 1229   PROTEINUR NEGATIVE 04/23/2024 1229   UROBILINOGEN 1.0 04/30/2020 0831   UROBILINOGEN 0.2 06/16/2014 0942   NITRITE POSITIVE (A) 04/23/2024 1229   LEUKOCYTESUR SMALL (A) 04/23/2024 1229     ----------------------------------------------------------------------------------------------------------------  Imaging Results:    CT ABDOMEN PELVIS WO CONTRAST Result Date: 04/23/2024 CLINICAL DATA:  Urinary retention. Acute generalized abdominal pain. EXAM: CT ABDOMEN AND PELVIS WITHOUT CONTRAST TECHNIQUE: Multidetector CT imaging of the abdomen and pelvis was performed following the standard protocol without IV contrast. RADIATION DOSE REDUCTION: This exam was performed according to the departmental dose-optimization program which includes automated exposure control, adjustment of the mA and/or kV according to patient size and/or use of iterative reconstruction technique. COMPARISON:  August 05, 2023. FINDINGS: Lower chest: No acute abnormality. Hepatobiliary: Hepatic steatosis. Status post cholecystectomy. No biliary dilatation. Pancreas: Fatty atrophy of pancreas is again noted. No acute inflammation is noted. Spleen: Normal in size without focal abnormality. Adrenals/Urinary Tract: Adrenal glands and kidneys are unremarkable. No hydronephrosis or renal obstruction is noted. Urinary bladder is decompressed secondary to Foley catheter. Stomach/Bowel: The stomach is unremarkable. There is no evidence of bowel obstruction or inflammation. Stool is noted throughout the colon. The appendix is not clearly visualized. Vascular/Lymphatic: Aortic atherosclerosis. No enlarged abdominal or pelvic lymph nodes. Reproductive: Uterus and bilateral adnexa are unremarkable. Other: No abdominal wall hernia or abnormality. No abdominopelvic ascites. Musculoskeletal: Stable old T11 fracture. Interval development moderate compression deformity of L1 vertebral body with sclerosis concerning for subacute to old fracture. There also appears to be interval development of superior endplate depression of L3, L4 and L5 vertebral bodies concerning for subacute to old fractures. MRI may be performed for further evaluation.  IMPRESSION: 1. Hepatic steatosis. 2. Interval development of moderate compression deformity of L1 vertebral body with sclerosis concerning for subacute to old fracture. There also appears to be interval development of superior endplate depression of L3, L4 and L5 vertebral bodies concerning for subacute to old fractures. MRI may be performed for further evaluation. 3. Aortic atherosclerosis. Aortic Atherosclerosis (ICD10-I70.0). Electronically Signed   By: Lynwood Landy Raddle M.D.   On: 04/23/2024 14:44   DG Chest Port 1 View Result Date: 04/23/2024 CLINICAL DATA:  Shortness of breath. EXAM: PORTABLE CHEST 1 VIEW COMPARISON:  02/19/2024. FINDINGS: The heart size and mediastinal contours are unchanged. Similar chronic scarring in the bilateral upper lung zones and left lung base. No evidence of overt edema, focal consolidation, sizeable pleural effusion or pneumothorax. No acute osseous abnormality. IMPRESSION: No acute cardiopulmonary findings. Electronically Signed   By: Harrietta Sherry M.D.   On: 04/23/2024 12:54     EKG:     Assessment & Plan:    Active Problems:   Mixed hyperlipidemia   Pulmonary HTN (HCC)   Vitamin D  deficiency   Osteoporosis with current pathological fracture   UTI (urinary tract infection)   UTI Urinary retention - With known history of chronic urinary retention, requiring In-N-Out, - UA is positive, recent history of Pseudomonas in urine culture, will start on Zosyn , follow urine cultures - Foley catheter inserted in ED, this can be continued at time of discharge where she can resume her In-N-Out.  Worsening lower extremity edema. - There is no significant evidence of systemic volume overload, BNP within normal limit, there is no 2D echo in records, so I will obtain 2D echo. - Obtain venous Doppler to rule out DVT -he is with +3 edema, will try for aggressive diuresis but I think her blood pressure would be a limiting factor - Continue with daily weights, strict ins  and outs and low-salt diet  Weakness/deconditioning - Will consult PT, OT.  Sarcoidosis/interstitial lung disease (HCC)/Chronic respiratory failure with hypoxia (HCC) - She is on oxygen   at baseline.)  4 to 5 L - Courage to use incentive spirometer. - She is on steroids -No acute finding on chest x-ray    Vit D Deficiency History of chronic lumbar compression fractures Concern for osteoporosis>> in the setting of chronic steroid use - With history of vitamin D  deficiency, will start on calcium  and vitamin D . -Patient reports she is compliant with Fosamax  -Patient with history of lumbar compression fracture, recent discharge from CIR, appears to be some worsening on current imaging, but she denies any worsening lower back pain or any neurodeficits.  Deficits   Morbid Obesity - Current BMI appears to be inaccurate, she appears to be with morbid obesity, will obtain accurate weights and BMI parameters once on the floor   Addison's disease -Continue home hydrocortisone ( for now we will give 1 dose of stress dose 100 milligram IV hydrocortisone  )  Chronic hypotension - Continue home midodrine  5 mg twice daily  Hyperlipidemia - Continue home rosuvastatin   Hypothyroidism - Continue home levothyroxine    Chronic pain syndrome History of impaired compression fractures -Continue with home pain regmine  Anxiety and depression: Continue home buspirone , cymbalta , prn atarax    HFpEF -Repeat 2D echo -No evidence of systemic volume overload, she has normal BNP, as well no evidence of volume overload on chest x-ray.    Left femoral head avascular necrosis not a surgical candidate per ortho: In the setting of chronic steroid use Continue supportive care.    DVT Prophylaxis -  Lovenox    AM Labs Ordered, also please review Full Orders  Family Communication: Admission, patients condition and plan of care including tests being ordered have been discussed with the patient who indicate  understanding and agree with the plan and Code Status.  Code Status Full  Likely DC to  home  Consults called: none    Admission status: observation    Time spent in minutes : 70 minutes   Brayton Lye M.D on 04/23/2024 at 3:49 PM   [facility] - Office  [phone]

## 2024-04-23 NOTE — ED Provider Notes (Signed)
 [place] EMERGENCY DEPARTMENT AT [facility] Provider Note  CSN: [id number] Arrival date & time: 04/23/24 1201  Chief Complaint(s) Urinary Retention  HPI Kathleen [name] is a 63 y.o. female history of fibromyalgia, adrenal insufficiency, sarcoidosis, chronic hypoxic respiratory failure on home oxygen , low back pain presenting to the emergency department with leg swelling.  Patient reports that she has had worsening leg swelling, shortness of breath lately.  Reports increased oxygen  requirement.  Reports trouble ambulating due to swelling and dyspnea.  Also reports abdominal distention and pain, trouble urinating.  Patient reports he intermittently caths herself at home and had to catheter self more recently.  No fevers.  Reports some chest pain also.  No pleuritic pain.  No other new symptoms.   Past Medical History Past Medical History:  Diagnosis Date   Adrenal insufficiency (HCC)    Anemia    Anxiety    Avascular bone necrosis (HCC)    Breast fibrocystic disorder    Chronic pain    Encounter for long-term opiate analgesic use 02/02/2020   Patient has chronic pain with femoral head avascular necrosis.  Is under pain contract   Fibromyalgia    Gastroesophageal reflux disease    Hypothyroidism    Mitral valve prolapse    Orthostatic hypotension    Peripheral neuropathy    PONV (postoperative nausea and vomiting)    Restrictive lung disease    Moderate to severe   Sarcoidosis    Biopsy proven - [facility]   Sarcoidosis    SOB (shortness of breath)    chronic   Patient Active Problem List   Diagnosis Date Noted   UTI (urinary tract infection) 04/23/2024   Osteoporosis with current pathological fracture 03/17/2024   Anxiety 02/11/2024   Spinal compression fracture (HCC) 01/29/2024   Lower back pain 01/26/2024   Closed compression fracture of L3 lumbar vertebra, initial encounter (HCC) 01/21/2024   Intractable low back pain 01/03/2024   Ambulatory dysfunction 12/11/2023    Uncontrolled pain 12/08/2023   Pressure injury of skin 12/08/2023   Intractable back pain 12/07/2023   Prediabetes 10/28/2023   Ankle fracture, bimalleolar, closed, left, sequela 03/10/2023   Closed fracture of left ankle 03/10/2023   Syndesmotic disruption of ankle, left, initial encounter 03/10/2023   Lumbar transverse process fracture (HCC) 01/02/2023   Acute on chronic hypoxic respiratory failure (HCC) 12/13/2022   Depression, major, single episode, moderate (HCC) 09/10/2022   Aortic atherosclerosis (HCC) 09/10/2022   Chronic respiratory failure with hypoxia (HCC) 05/22/2021   Anxiety and depression 05/22/2021   Vitamin D  deficiency 05/09/2021   Left foot drop 01/09/2021   Pulmonary HTN (HCC) 10/12/2020   Encounter for long-term opiate analgesic use 02/02/2020   Interstitial lung disease (HCC) 04/14/2019   GERD (gastroesophageal reflux disease) 10/15/2017   Personal history of noncompliance with medical treatment, presenting hazards to health 04/26/2017   Hypothyroidism 01/15/2017   Adrenal insufficiency (Addison's disease) (HCC) 08/22/2016   Symptomatic bradycardia    Adrenal insufficiency (HCC) 08/17/2016   Hypokalemia 08/17/2016   Chronic pain syndrome 04/20/2016   Irritable bowel syndrome with constipation 11/14/2015   Avascular necrosis of bone of hip, left (HCC) 04/05/2015   Post herpetic neuralgia 10/22/2014   Mixed hyperlipidemia 08/25/2014   Obstructive sleep apnea 02/18/2014   Fibromyalgia 02/11/2013   Bilateral lower extremity edema 01/22/2013   Hypotension 01/16/2013   Fibrocystic breast disease in female 04/09/2012   Sarcoidosis 06/21/2008   Home Medication(s) Prior to Admission medications   Medication Sig Start  Date End Date Taking? Authorizing Provider  acetaminophen  (TYLENOL ) 325 MG tablet Take 2 tablets (650 mg total) by mouth every 6 (six) hours as needed for mild pain (pain score 1-3) or fever (or Fever >/= 101). 02/19/24   Angiulli, Toribio PARAS, PA-C   albuterol  (PROVENTIL ) (2.5 MG/3ML) 0.083% nebulizer solution Inhale 3 mL (2.5 mg total) by nebulization every four (4) hours as needed for wheezing or shortness of breath. 01/23/24   Pearlean Manus, MD  alendronate  (FOSAMAX ) 70 MG tablet Take 1 tablet (70 mg total) by mouth every Saturday. Take with a full glass of water  on an empty stomach. 03/14/24   Alphonsa Glendia LABOR, MD  budesonide  (PULMICORT ) 0.5 MG/2ML nebulizer solution Inhale the contents of 1 vial via nebulizer two (2) times a day. 01/23/24   Pearlean Manus, MD  busPIRone  (BUSPAR ) 15 MG tablet Take 1 tablet (15 mg) by mouth 3 times daily. 03/09/24   Alphonsa Glendia LABOR, MD  Calcium  Citrate 333 MG TABS Take one tablet by mouth 3 (three) times daily with meals. 03/17/24   Nida, Gebreselassie W, MD  cyanocobalamin  (VITAMIN B12) 1000 MCG/ML injection Inject 1 mL (1,000 mcg total) into the skin daily. 02/07/24   Franchot Novel, MD  DULoxetine  (CYMBALTA ) 60 MG capsule Take 1 capsule (60 mg total) by mouth daily. 03/09/24   Alphonsa Glendia LABOR, MD  ferrous sulfate  (FEROSUL) 325 (65 FE) MG tablet Take 1 tablet (325 mg total) by mouth once a week. 04/17/24   Alphonsa Glendia LABOR, MD  folic acid  (FOLVITE ) 1 MG tablet Take 1 tablet (1 mg total) by mouth daily. 03/23/24   Alphonsa Glendia LABOR, MD  furosemide  (LASIX ) 20 MG tablet Take 1 tablet by mouth on Monday, Wednesday and Friday as needed for pedal edema 03/09/24   Alphonsa Glendia LABOR, MD  hydrocortisone  (CORTEF ) 10 MG tablet Take 2 tablets by mouth at 8:00 am and 1 tablet at lunch. (May double dose on sick days x 3-5 days) 11/14/23   Nida, Gebreselassie W, MD  ipratropium-albuterol  (DUONEB) 0.5-2.5 (3) MG/3ML SOLN Take 3 mLs by nebulization every 8 (eight) hours. 01/23/24   Pearlean Manus, MD  levothyroxine  (SYNTHROID ) 50 MCG tablet Take 1 tablet by mouth daily before breakfast. 03/09/24 09/05/24  Alphonsa Glendia LABOR, MD  lidocaine  (LIDODERM ) 5 % Place 1 patch onto the skin daily. Remove & Discard patch within 12 hours or as  directed by MD 02/24/24   Angiulli, Toribio PARAS, PA-C  linaclotide  (LINZESS ) 145 MCG CAPS capsule Take 1 capsule (145 mcg total) by mouth daily before breakfast. 03/09/24   Alphonsa Glendia LABOR, MD  midodrine  (PROAMATINE ) 5 MG tablet Take 1 tablet (5 mg total) by mouth 2 (two) times daily with a meal. 03/09/24   Luking, Glendia LABOR, MD  ondansetron  (ZOFRAN -ODT) 8 MG disintegrating tablet Dissolve 1 tablet (8 mg total) by mouth every 8 (eight) hours as needed. 04/07/24 04/07/25  Alphonsa Glendia LABOR, MD  oxyCODONE  (ROXICODONE ) 15 MG immediate release tablet Take 1 tablet (15 mg total) by mouth every 4 (four) hours as needed for pain 04/04/24 03/27/24     Oxycodone  HCl 10 MG TABS Take 2 tablets (20 mg total) by mouth every 4 (four) hours as needed (severe pain >level 7 after MS contin  started). 02/24/24   Angiulli, Toribio PARAS, PA-C  OXYGEN  Inhale 5 L into the lungs continuous.    [provider]  pantoprazole  (PROTONIX ) 40 MG tablet Take 1 tablet (40 mg total) by mouth 2 (two) times daily. 03/09/24  Alphonsa Glendia LABOR, MD  polyethylene glycol (MIRALAX  / GLYCOLAX ) 17 g packet Take 17 g by mouth 2 (two) times daily. 01/23/24   Pearlean Manus, MD  revefenacin  (YUPELRI ) 175 MCG/3ML nebulizer solution Take 3 mLs (175 mcg total) by nebulization daily. 02/24/24   Angiulli, Toribio PARAS, PA-C  rosuvastatin  (CRESTOR ) 20 MG tablet Take 1 tablet (20 mg total) by mouth daily. 03/09/24   Alphonsa Glendia LABOR, MD  senna-docusate (SENOKOT-S) 8.6-50 MG tablet Take 2 tablets by mouth at bedtime. 01/23/24   Pearlean Manus, MD  tamsulosin  (FLOMAX ) 0.4 MG CAPS capsule Take 2 capsules (0.8 mg total) by mouth daily after supper. 03/09/24   Alphonsa Glendia LABOR, MD  topiramate  (TOPAMAX ) 50 MG tablet Take 1 tablet (50 mg total) by mouth daily with lunch. 03/17/24   Lenis Ethelle ORN, MD  Vitamin D , Ergocalciferol , (DRISDOL ) 1.25 MG (50000 UNIT) CAPS capsule Take 1 capsule (50,000 Units total) by mouth every Sunday. 03/01/24   Angiulli, Toribio PARAS, PA-C                                                                                                                                     Past Surgical History Past Surgical History:  Procedure Laterality Date   BIOPSY  10/17/2018   Procedure: BIOPSY;  Surgeon: Golda Claudis PENNER, MD;  Location: [facility];  Service: Endoscopy;;  duodenum, antrum, gastric body   BIOPSY  03/03/2021   Procedure: BIOPSY;  Surgeon: Golda Claudis PENNER, MD;  Location: [facility];  Service: Endoscopy;;   BIOPSY  09/11/2023   Procedure: BIOPSY;  Surgeon: Shaaron Lamar HERO, MD;  Location: [facility];  Service: Endoscopy;;   BREAST LUMPECTOMY Bilateral 01/12/2011   CHOLECYSTECTOMY N/A 04/17/2021   Procedure: LAPAROSCOPIC CHOLECYSTECTOMY;  Surgeon: Mavis Anes, MD;  Location: [facility];  Service: General;  Laterality: N/A;   COLONOSCOPY WITH PROPOFOL  N/A 01/03/2022   Procedure: COLONOSCOPY WITH PROPOFOL ;  Surgeon: Golda Claudis PENNER, MD;  Location: [facility];  Service: Endoscopy;  Laterality: N/A;  220   ESOPHAGEAL DILATION N/A 03/03/2021   Procedure: ESOPHAGEAL DILATION;  Surgeon: Golda Claudis PENNER, MD;  [facility]: [facility];  Service: Endoscopy;  Laterality: N/A;   ESOPHAGOGASTRODUODENOSCOPY (EGD) WITH PROPOFOL  N/A 10/17/2018   Procedure: ESOPHAGOGASTRODUODENOSCOPY (EGD) WITH PROPOFOL ;  Surgeon: Golda Claudis PENNER, MD;  [facility]: [facility];  Service: Endoscopy;  Laterality: N/A;  2:25   ESOPHAGOGASTRODUODENOSCOPY (EGD) WITH PROPOFOL  N/A 03/03/2021   Procedure: ESOPHAGOGASTRODUODENOSCOPY (EGD) WITH PROPOFOL ;  Surgeon: Golda Claudis PENNER, MD;  [facility]: [facility];  Service: Endoscopy;  Laterality: N/A;  12:15   ESOPHAGOGASTRODUODENOSCOPY (EGD) WITH PROPOFOL  N/A 09/11/2023   Procedure: ESOPHAGOGASTRODUODENOSCOPY (EGD) WITH PROPOFOL ;  Surgeon: Shaaron Lamar HERO, MD;  [facility]: [facility];  Service: Endoscopy;  Laterality: N/A;  2:30 pm, asa 3, LM to see if pt can come earlier   [facility] DILATION N/A 09/11/2023   Procedure: AGAPITO  DILATION;  Surgeon: Shaaron Lamar HERO, MD;  [facility]: [facility];  Service: Endoscopy;  Laterality: N/A;   ORIF ANKLE FRACTURE Left 03/10/2023   Procedure: OPEN REDUCTION INTERNAL FIXATION (ORIF) ANKLE FRACTURE;  Surgeon: Georgina Ozell LABOR, MD;  Location: [facility];  Service: Orthopedics;  Laterality: Left;   POLYPECTOMY  01/03/2022   Procedure: POLYPECTOMY INTESTINAL;  Surgeon: Golda Claudis PENNER, MD;  [facility]: [facility];  Service: Endoscopy;;   TUBAL LIGATION     Family History Family History  Problem Relation Age of Onset   Cardiomyopathy Mother    Breast cancer Mother    Prostate cancer Father    Heart attack Maternal Grandmother    Heart attack Maternal Grandfather    Heart attack Paternal Grandmother    Bipolar disorder Daughter    Heart disease Maternal Aunt    Neurofibromatosis Cousin    Colon cancer Neg Hx     Social History Social History   Tobacco Use   Smoking status: Never    Passive exposure: Never   Smokeless tobacco: Never  Vaping Use   Vaping status: Never Used  Substance Use Topics   Alcohol use: No   Drug use: No   Allergies Bee venom, Contrast media [iodinated contrast media], Bactrim  [sulfamethoxazole -trimethoprim ], and Codeine  Review of Systems Review of Systems  All other systems reviewed and are negative.   Physical Exam Vital Signs  I have reviewed the triage vital signs BP (!) 110/48   Pulse 83   Resp 15   Ht 5' 10 (1.778 m)   Wt 108.9 kg   LMP 05/17/2016   SpO2 100%   BMI 34.44 kg/m  Physical Exam Vitals and nursing note reviewed.  Constitutional:      General: She is not in acute distress.    Appearance: She is well-developed. She is obese.  HENT:     Head: Normocephalic and atraumatic.     Mouth/Throat:     Mouth: Mucous membranes are moist.  Eyes:     Pupils: Pupils are equal, round, and reactive to light.  Cardiovascular:     Rate and Rhythm: Normal rate and regular rhythm.     Heart sounds: No murmur  heard. Pulmonary:     Effort: Pulmonary effort is normal. No respiratory distress.     Breath sounds: Normal breath sounds.  Abdominal:     General: Abdomen is flat.     Palpations: Abdomen is soft.     Tenderness: There is abdominal tenderness (generalized).  Musculoskeletal:        General: No tenderness.     Right lower leg: Edema present.     Left lower leg: Edema present.     Comments: No lower extremity erythema  Skin:    General: Skin is warm and dry.  Neurological:     General: No focal deficit present.     Mental Status: She is alert. Mental status is at baseline.  Psychiatric:        Mood and Affect: Mood normal.        Behavior: Behavior normal.     ED Results and Treatments Labs (all labs ordered are listed, but only abnormal results are displayed) Labs Reviewed  BASIC METABOLIC PANEL WITH GFR - Abnormal; Notable for the following components:      Result Value   Glucose, Bld 119 (*)    BUN 7 (*)    All other components within normal limits  CBC - Abnormal; Notable for the following  components:   RBC 3.70 (*)    Hemoglobin 9.8 (*)    HCT 34.8 (*)    MCHC 28.2 (*)    RDW 17.2 (*)    All other components within normal limits  URINALYSIS, ROUTINE W REFLEX MICROSCOPIC - Abnormal; Notable for the following components:   Color, Urine AMBER (*)    Specific Gravity, Urine 1.004 (*)    Hgb urine dipstick SMALL (*)    Nitrite POSITIVE (*)    Leukocytes,Ua SMALL (*)    Bacteria, UA RARE (*)    All other components within normal limits  HEPATIC FUNCTION PANEL - Abnormal; Notable for the following components:   Total Protein 6.2 (*)    Albumin 2.9 (*)    All other components within normal limits  URINE CULTURE  BRAIN NATRIURETIC PEPTIDE  LIPASE, BLOOD                                                                                                                          Radiology CT ABDOMEN PELVIS WO CONTRAST Result Date: 04/23/2024 CLINICAL DATA:  Urinary  retention. Acute generalized abdominal pain. EXAM: CT ABDOMEN AND PELVIS WITHOUT CONTRAST TECHNIQUE: Multidetector CT imaging of the abdomen and pelvis was performed following the standard protocol without IV contrast. RADIATION DOSE REDUCTION: This exam was performed according to the departmental dose-optimization program which includes automated exposure control, adjustment of the mA and/or kV according to patient size and/or use of iterative reconstruction technique. COMPARISON:  August 05, 2023. FINDINGS: Lower chest: No acute abnormality. Hepatobiliary: Hepatic steatosis. Status post cholecystectomy. No biliary dilatation. Pancreas: Fatty atrophy of pancreas is again noted. No acute inflammation is noted. Spleen: Normal in size without focal abnormality. Adrenals/Urinary Tract: Adrenal glands and kidneys are unremarkable. No hydronephrosis or renal obstruction is noted. Urinary bladder is decompressed secondary to Foley catheter. Stomach/Bowel: The stomach is unremarkable. There is no evidence of bowel obstruction or inflammation. Stool is noted throughout the colon. The appendix is not clearly visualized. Vascular/Lymphatic: Aortic atherosclerosis. No enlarged abdominal or pelvic lymph nodes. Reproductive: Uterus and bilateral adnexa are unremarkable. Other: No abdominal wall hernia or abnormality. No abdominopelvic ascites. Musculoskeletal: Stable old T11 fracture. Interval development moderate compression deformity of L1 vertebral body with sclerosis concerning for subacute to old fracture. There also appears to be interval development of superior endplate depression of L3, L4 and L5 vertebral bodies concerning for subacute to old fractures. MRI may be performed for further evaluation. IMPRESSION: 1. Hepatic steatosis. 2. Interval development of moderate compression deformity of L1 vertebral body with sclerosis concerning for subacute to old fracture. There also appears to be interval development of  superior endplate depression of L3, L4 and L5 vertebral bodies concerning for subacute to old fractures. MRI may be performed for further evaluation. 3. Aortic atherosclerosis. Aortic Atherosclerosis (ICD10-I70.0). Electronically Signed   By: Lynwood Landy Raddle M.D.   On: 04/23/2024 14:44   DG Chest Port 1 View Result Date: 04/23/2024 CLINICAL DATA:  Shortness of breath. EXAM: PORTABLE CHEST 1 VIEW COMPARISON:  02/19/2024. FINDINGS: The heart size and mediastinal contours are unchanged. Similar chronic scarring in the bilateral upper lung zones and left lung base. No evidence of overt edema, focal consolidation, sizeable pleural effusion or pneumothorax. No acute osseous abnormality. IMPRESSION: No acute cardiopulmonary findings. Electronically Signed   By: Harrietta Sherry M.D.   On: 04/23/2024 12:54    Pertinent labs & imaging results that were available during my care of the patient were reviewed by me and considered in my medical decision making (see MDM for details).  Medications Ordered in ED Medications  cefTRIAXone  (ROCEPHIN ) 1 g in sodium chloride  0.9 % 100 mL IVPB (1 g Intravenous New Bag/Given 04/23/24 1542)  furosemide  (LASIX ) injection 40 mg (40 mg Intravenous Given 04/23/24 1538)                                                                                                                                     Procedures Procedures  (including critical care time)  Medical Decision Making / ED Course   MDM:  63 year old presenting to the emergency department with edema.  Patient overall well-appearing, vital signs without hypoxia on chronic oxygen , no tachypnea, no tachycardia.  Does have some lower extremity edema.  Generalized abdominal tenderness.  Seems lower extremity swelling is somewhat chronic.  Patient has had numerous previous venous ultrasounds of her lower extremities.  Have been negative.  Seems consistent with previous episodes.  Likely chronic venous stasis and  hypoalbuminemia.  Has been on diuretics at home but persistent.  Do not think she needs repeat ultrasound today.  No evidence of cellulitis.  She also has abdominal pain and tenderness, reports nausea.  Will obtain CT abdomen given her tenderness to evaluate for acute intra-abdominal process such as pancreatitis, cholecystitis, obstruction, perforation, diverticulitis.  She also reports urinary retention.  This is also somewhat chronic problem.  She self caths herself intermittently at home as needed.  She had a PVR of greater than 600.  So catheter was placed by nursing.  Urinalysis does show evidence of possible UTI.  Will start antibiotics.  Will reassess.  Clinical Course as of 04/23/24 1548  Thu Apr 23, 2024  1546 CT abdomen negative. Patient reports generalized weakness and trouble caring for self at home. Given also LE edema discussed with Dr. Sherlon who will admit patient for further care.  [WS]    Clinical Course User Index [WS] Francesca Elsie CROME, MD     Additional history obtained:  -External records from outside source obtained and reviewed including: Chart review including previous notes, labs, imaging, consultation notes including prior note    Lab Tests: -I ordered, reviewed, and interpreted labs.   The pertinent results include:   Labs Reviewed  BASIC METABOLIC PANEL WITH GFR - Abnormal; Notable for the following components:      Result Value   Glucose, Bld 119 (*)  BUN 7 (*)    All other components within normal limits  CBC - Abnormal; Notable for the following components:   RBC 3.70 (*)    Hemoglobin 9.8 (*)    HCT 34.8 (*)    MCHC 28.2 (*)    RDW 17.2 (*)    All other components within normal limits  URINALYSIS, ROUTINE W REFLEX MICROSCOPIC - Abnormal; Notable for the following components:   Color, Urine AMBER (*)    Specific Gravity, Urine 1.004 (*)    Hgb urine dipstick SMALL (*)    Nitrite POSITIVE (*)    Leukocytes,Ua SMALL (*)    Bacteria, UA  RARE (*)    All other components within normal limits  HEPATIC FUNCTION PANEL - Abnormal; Notable for the following components:   Total Protein 6.2 (*)    Albumin 2.9 (*)    All other components within normal limits  URINE CULTURE  BRAIN NATRIURETIC PEPTIDE  LIPASE, BLOOD    Notable for UTI  EKG   EKG Interpretation Date/Time:  Thursday April 23 2024 12:30:21 EDT Ventricular Rate:  87 PR Interval:  129 QRS Duration:  78 QT Interval:  361 QTC Calculation: 435 R Axis:   60  Text Interpretation: Sinus rhythm Low voltage, precordial leads Abnormal R-wave progression, early transition Confirmed by Francesca Fallow ([id number]) on 04/23/2024 2:39:06 PM         Imaging Studies ordered: I ordered imaging studies including CT abdomen, CXR On my interpretation imaging demonstrates no acute process I independently visualized and interpreted imaging. I agree with the radiologist interpretation   Medicines ordered and prescription drug management: Meds ordered this encounter  Medications   cefTRIAXone  (ROCEPHIN ) 1 g in sodium chloride  0.9 % 100 mL IVPB    Antibiotic Indication::   UTI   furosemide  (LASIX ) injection 40 mg    -I have reviewed the patients home medicines and have made adjustments as needed   Consultations Obtained: I requested consultation with the hospitalist,  and discussed lab and imaging findings as well as pertinent plan - they recommend: admission   Cardiac Monitoring: The patient was maintained on a cardiac monitor.  I personally viewed and interpreted the cardiac monitored which showed an underlying rhythm of: NSR  Social Determinants of Health:  Diagnosis or treatment significantly limited by social determinants of health: obesity   Reevaluation: After the interventions noted above, I reevaluated the patient and found that their symptoms have improved  Co morbidities that complicate the patient evaluation  Past Medical History:  Diagnosis Date    Adrenal insufficiency (HCC)    Anemia    Anxiety    Avascular bone necrosis (HCC)    Breast fibrocystic disorder    Chronic pain    Encounter for long-term opiate analgesic use 02/02/2020   Patient has chronic pain with femoral head avascular necrosis.  Is under pain contract   Fibromyalgia    Gastroesophageal reflux disease    Hypothyroidism    Mitral valve prolapse    Orthostatic hypotension    Peripheral neuropathy    PONV (postoperative nausea and vomiting)    Restrictive lung disease    Moderate to severe   Sarcoidosis    Biopsy proven - [facility]   Sarcoidosis    SOB (shortness of breath)    chronic      Dispostion: Disposition decision including need for hospitalization was considered, and patient admitted to the hospital.    Final Clinical Impression(s) / ED Diagnoses Final diagnoses:  Acute cystitis  without hematuria  Urinary retention     This chart was dictated using voice recognition software.  Despite best efforts to proofread,  errors can occur which can change the documentation meaning.    Francesca Elsie CROME, MD 04/23/24 [phone]

## 2024-04-23 NOTE — Progress Notes (Unsigned)
   Subjective:    Patient ID: Kathleen [name], female    DOB: [birth year], 63 y.o.   MRN: [id number]  HPI Discussed the use of AI scribe software for clinical note transcription with the patient, who gave verbal consent to proceed.  History of Present Illness Kathleen [name] is a 63 year old female with pulmonary hypertension and hypoxia who presents with worsening edema and urinary retention.  She has been experiencing significant bloating and swelling in her legs and feet, worsening over the past couple of weeks. The swelling causes her legs to feel heavy and makes standing difficult. She is taking Lasix  (furosemide ) three times a week (Monday, Wednesday, Friday) for the swelling, but it is not effectively reducing the fluid retention.  She experiences difficulty with urination, stating she cannot urinate at times. Her urine is light-colored, and she uses an in-and-out catheter occasionally, sometimes obtaining 200 to 600 cc of urine. This issue has been ongoing since she broke her back, and she has been self-catheterizing for several years.  She has a history of pulmonary hypertension and hypoxia, requiring continuous oxygen  use at home. Her oxygen  levels are stable with the use of supplemental oxygen , but she cannot go outside without it. She sleeps with her head elevated to aid breathing. No unusual cough or shortness of breath.  She experiences chronic constipation, taking stool softeners and enemas regularly, at least once a week. She is on Linzess  for bowel management, but it has not been effective. She attributes some of her constipation to her pain medication use.  She feels very tired and drained, which is sometimes normal for her. She has difficulty standing without assistance and has been using a wheelchair more frequently since her discharge from inpatient rehab two months ago. No recent chest pain, though she had a significant episode a couple of weeks ago that resolved after a  day.     Review of Systems     Objective:   Physical Exam        Assessment & Plan:

## 2024-04-23 NOTE — Plan of Care (Signed)
   Problem: Education: Goal: Knowledge of General Education information will improve Description: Including pain rating scale, medication(s)/side effects and non-pharmacologic comfort measures Outcome: Progressing   Problem: Clinical Measurements: Goal: Ability to maintain clinical measurements within normal limits will improve Outcome: Progressing Goal: Diagnostic test results will improve Outcome: Progressing

## 2024-04-23 NOTE — ED Triage Notes (Signed)
 Pt sent by PCP for generalized edema. Pt is also experiencing decrease urine output. PCP wants a catheter placed. Pt has hx of CHF wears 3L of oxygen  at baseline and has increased to 4L feeling SHOB.

## 2024-04-23 NOTE — Progress Notes (Signed)
 Patient arrived to floor via er gurney stating I can't move you have to pull me. However patient able to move herself onto right side after being placed flat with no problems. Patient belligerent yelling at staff to do this and that right now I said> Patient removing IV Tegaderm was asked politely to quit removing it to which she replied I will do what I want I dont like it here it hurts and proceeds to pull up all the edges of the Tegaderm exposing iv catheter to potential infection and accidental removal. Patient then proceeds to yell about being hungry as we try to explain her tray is on the bedside table and will be relocated over bed after she lies still for vital signs . Patient refusing tele monitor threatening to have myself and tech both fired .Patient rude condescending and demanding. Patient demands oxygen  be turned up to 5 liters and yells at us  to leave room and send our supervisor to bedside. ER charge nurse notified and sent immediately to room.

## 2024-04-24 ENCOUNTER — Observation Stay (HOSPITAL_COMMUNITY)

## 2024-04-24 ENCOUNTER — Other Ambulatory Visit (HOSPITAL_COMMUNITY): Payer: Self-pay

## 2024-04-24 ENCOUNTER — Other Ambulatory Visit (HOSPITAL_COMMUNITY): Payer: Self-pay | Admitting: *Deleted

## 2024-04-24 ENCOUNTER — Encounter: Payer: Self-pay | Admitting: Nurse Practitioner

## 2024-04-24 ENCOUNTER — Other Ambulatory Visit: Payer: Self-pay | Admitting: Family Medicine

## 2024-04-24 DIAGNOSIS — K219 Gastro-esophageal reflux disease without esophagitis: Secondary | ICD-10-CM | POA: Diagnosis present

## 2024-04-24 DIAGNOSIS — A419 Sepsis, unspecified organism: Secondary | ICD-10-CM | POA: Diagnosis not present

## 2024-04-24 DIAGNOSIS — M8000XA Age-related osteoporosis with current pathological fracture, unspecified site, initial encounter for fracture: Secondary | ICD-10-CM | POA: Diagnosis not present

## 2024-04-24 DIAGNOSIS — E876 Hypokalemia: Secondary | ICD-10-CM | POA: Diagnosis present

## 2024-04-24 DIAGNOSIS — J811 Chronic pulmonary edema: Secondary | ICD-10-CM | POA: Diagnosis not present

## 2024-04-24 DIAGNOSIS — I341 Nonrheumatic mitral (valve) prolapse: Secondary | ICD-10-CM | POA: Diagnosis present

## 2024-04-24 DIAGNOSIS — J849 Interstitial pulmonary disease, unspecified: Secondary | ICD-10-CM | POA: Diagnosis present

## 2024-04-24 DIAGNOSIS — J9600 Acute respiratory failure, unspecified whether with hypoxia or hypercapnia: Secondary | ICD-10-CM | POA: Diagnosis not present

## 2024-04-24 DIAGNOSIS — I272 Pulmonary hypertension, unspecified: Secondary | ICD-10-CM

## 2024-04-24 DIAGNOSIS — I9589 Other hypotension: Secondary | ICD-10-CM | POA: Diagnosis present

## 2024-04-24 DIAGNOSIS — J9621 Acute and chronic respiratory failure with hypoxia: Secondary | ICD-10-CM | POA: Diagnosis present

## 2024-04-24 DIAGNOSIS — E559 Vitamin D deficiency, unspecified: Secondary | ICD-10-CM

## 2024-04-24 DIAGNOSIS — D638 Anemia in other chronic diseases classified elsewhere: Secondary | ICD-10-CM | POA: Diagnosis present

## 2024-04-24 DIAGNOSIS — N3000 Acute cystitis without hematuria: Secondary | ICD-10-CM

## 2024-04-24 DIAGNOSIS — N390 Urinary tract infection, site not specified: Secondary | ICD-10-CM | POA: Diagnosis not present

## 2024-04-24 DIAGNOSIS — F419 Anxiety disorder, unspecified: Secondary | ICD-10-CM | POA: Diagnosis present

## 2024-04-24 DIAGNOSIS — M797 Fibromyalgia: Secondary | ICD-10-CM | POA: Diagnosis present

## 2024-04-24 DIAGNOSIS — D869 Sarcoidosis, unspecified: Secondary | ICD-10-CM | POA: Diagnosis present

## 2024-04-24 DIAGNOSIS — E782 Mixed hyperlipidemia: Secondary | ICD-10-CM

## 2024-04-24 DIAGNOSIS — I5032 Chronic diastolic (congestive) heart failure: Secondary | ICD-10-CM | POA: Diagnosis present

## 2024-04-24 DIAGNOSIS — E039 Hypothyroidism, unspecified: Secondary | ICD-10-CM | POA: Diagnosis present

## 2024-04-24 DIAGNOSIS — M8088XD Other osteoporosis with current pathological fracture, vertebra(e), subsequent encounter for fracture with routine healing: Secondary | ICD-10-CM | POA: Diagnosis not present

## 2024-04-24 DIAGNOSIS — M87852 Other osteonecrosis, left femur: Secondary | ICD-10-CM | POA: Diagnosis present

## 2024-04-24 DIAGNOSIS — F32A Depression, unspecified: Secondary | ICD-10-CM | POA: Diagnosis present

## 2024-04-24 DIAGNOSIS — Z9981 Dependence on supplemental oxygen: Secondary | ICD-10-CM | POA: Diagnosis not present

## 2024-04-24 DIAGNOSIS — I5030 Unspecified diastolic (congestive) heart failure: Secondary | ICD-10-CM | POA: Diagnosis not present

## 2024-04-24 DIAGNOSIS — G629 Polyneuropathy, unspecified: Secondary | ICD-10-CM | POA: Diagnosis present

## 2024-04-24 DIAGNOSIS — M8000XG Age-related osteoporosis with current pathological fracture, unspecified site, subsequent encounter for fracture with delayed healing: Secondary | ICD-10-CM | POA: Diagnosis not present

## 2024-04-24 DIAGNOSIS — R339 Retention of urine, unspecified: Secondary | ICD-10-CM | POA: Diagnosis present

## 2024-04-24 DIAGNOSIS — R918 Other nonspecific abnormal finding of lung field: Secondary | ICD-10-CM | POA: Diagnosis not present

## 2024-04-24 DIAGNOSIS — E271 Primary adrenocortical insufficiency: Secondary | ICD-10-CM | POA: Diagnosis present

## 2024-04-24 DIAGNOSIS — J90 Pleural effusion, not elsewhere classified: Secondary | ICD-10-CM | POA: Diagnosis not present

## 2024-04-24 DIAGNOSIS — E8809 Other disorders of plasma-protein metabolism, not elsewhere classified: Secondary | ICD-10-CM | POA: Diagnosis present

## 2024-04-24 DIAGNOSIS — G894 Chronic pain syndrome: Secondary | ICD-10-CM | POA: Diagnosis present

## 2024-04-24 LAB — CBC
HCT: 27.6 % — ABNORMAL LOW (ref 36.0–46.0)
Hemoglobin: 8 g/dL — ABNORMAL LOW (ref 12.0–15.0)
MCH: 27.1 pg (ref 26.0–34.0)
MCHC: 29 g/dL — ABNORMAL LOW (ref 30.0–36.0)
MCV: 93.6 fL (ref 80.0–100.0)
Platelets: 438 K/uL — ABNORMAL HIGH (ref 150–400)
RBC: 2.95 MIL/uL — ABNORMAL LOW (ref 3.87–5.11)
RDW: 16.8 % — ABNORMAL HIGH (ref 11.5–15.5)
WBC: 7.2 K/uL (ref 4.0–10.5)
nRBC: 0.3 % — ABNORMAL HIGH (ref 0.0–0.2)

## 2024-04-24 LAB — BASIC METABOLIC PANEL WITH GFR
Anion gap: 11 (ref 5–15)
BUN: 8 mg/dL (ref 8–23)
CO2: 33 mmol/L — ABNORMAL HIGH (ref 22–32)
Calcium: 8.6 mg/dL — ABNORMAL LOW (ref 8.9–10.3)
Chloride: 98 mmol/L (ref 98–111)
Creatinine, Ser: 0.82 mg/dL (ref 0.44–1.00)
GFR, Estimated: >60 mL/min (ref >60)
Glucose, Bld: 124 mg/dL — ABNORMAL HIGH (ref 70–99)
Potassium: 3.9 mmol/L (ref 3.5–5.1)
Sodium: 142 mmol/L (ref 135–145)

## 2024-04-24 LAB — ECHOCARDIOGRAM COMPLETE
AR max vel: 2.05 cm2
AV Area VTI: 1.87 cm2
AV Area mean vel: 1.72 cm2
AV Mean grad: 7 mmHg
AV Peak grad: 12.7 mmHg
Ao pk vel: 1.78 m/s
Area-P 1/2: 2.91 cm2
Height: 70 in
S' Lateral: 2.9 cm
Weight: 4204.61 [oz_av]

## 2024-04-24 MED ORDER — MIDODRINE HCL 5 MG PO TABS
5.0000 mg | ORAL_TABLET | Freq: Three times a day (TID) | ORAL | Status: DC
  Administered 2024-04-24 – 2024-05-01 (×21): 5 mg via ORAL
  Filled 2024-04-24 (×20): qty 1

## 2024-04-24 MED ORDER — FUROSEMIDE 10 MG/ML IJ SOLN
20.0000 mg | Freq: Every day | INTRAMUSCULAR | Status: DC
  Administered 2024-04-24 – 2024-04-25 (×2): 20 mg via INTRAVENOUS
  Filled 2024-04-24 (×2): qty 2

## 2024-04-24 MED ORDER — LORAZEPAM 0.5 MG PO TABS
0.5000 mg | ORAL_TABLET | Freq: Three times a day (TID) | ORAL | Status: DC | PRN
  Administered 2024-04-24 – 2024-04-25 (×3): 0.5 mg via ORAL
  Filled 2024-04-24 (×3): qty 1

## 2024-04-24 MED ORDER — CHLORHEXIDINE GLUCONATE CLOTH 2 % EX PADS
6.0000 | MEDICATED_PAD | Freq: Every day | CUTANEOUS | Status: DC
  Administered 2024-04-24 – 2024-04-29 (×6): 6 via TOPICAL

## 2024-04-24 NOTE — Plan of Care (Signed)
  Problem: Acute Rehab OT Goals (only OT should resolve) Goal: Pt. Will Perform Grooming Flowsheets (Taken 04/24/2024 1036) Pt Will Perform Grooming:  with modified independence  standing Goal: Pt. Will Perform Lower Body Dressing Flowsheets (Taken 04/24/2024 1036) Pt Will Perform Lower Body Dressing:  with modified independence  with adaptive equipment  sitting/lateral leans Goal: Pt. Will Transfer To Toilet Flowsheets (Taken 04/24/2024 1036) Pt Will Transfer to Toilet:  with modified independence  ambulating Goal: Pt. Will Perform Toileting-Clothing Manipulation Flowsheets (Taken 04/24/2024 1036) Pt Will Perform Toileting - Clothing Manipulation and hygiene:  with modified independence  sitting/lateral leans Goal: Pt/Caregiver Will Perform Home Exercise Program Flowsheets (Taken 04/24/2024 1036) Pt/caregiver will Perform Home Exercise Program:  Increased strength  Both right and left upper extremity  Independently  Twanda Stakes OT, MOT

## 2024-04-24 NOTE — Progress Notes (Signed)
*  PRELIMINARY RESULTS* Echocardiogram 2D Echocardiogram has been performed.  Kathleen [name] 04/24/2024, 1:31 PM

## 2024-04-24 NOTE — Plan of Care (Signed)

## 2024-04-24 NOTE — Evaluation (Addendum)
 Physical Therapy Evaluation Patient Details Name: Kathleen [name] MRN: [id number] DOB: 04/24/1961 Today's Date: 04/24/2024  History of Present Illness  Kathleen [name]  is a 63 y.o. female, past medical history of interstitial lung disease, sarcoidosis, chronic respiratory failure with hypoxia on 5 L of oxygen  morbid obesity Addison's disease, bilateral hip avascular necrosis, lumbar back pain and lumbar compression fracture, status post recent hospitalization for kyphoplasty by IR and CIR discharge 02/25/2024.  - Was sent by her PCP, as she does appear to be with worsening urinary retention and lower extremity edema, patient with urinary retention at baseline, husband assisting her with self cath in and out permanently, but it does appear she has been requiring it more often, will she is with worsening lower extremity edema.  - In ED she was noted to have +3 lower extremity edema, she had Foley catheter inserted with 600 cc urine output, her UA is positive for pyuria, leukocytes and bacteriuria she is with history of Pseudomonas UTI (urine culture June/19/2025), her labs were significant for baseline hemoglobin at 9.8, chest x-ray with no acute findings, BNP within normal limit, CT abdomen pelvis with interval development of moderate compression deformity of L1, subacute to old fracture, and superior endplate depression L3, L4, L5 vertebral bodies concerning for subacute to old fractures.  [facility] hospitalist consulted to admit.   Clinical Impression  Patient agreeable to OT/PT co-evaluation. On this date, patient required supervision for bed mobility, and CGA for functional transfers and short ambulation with RW. Patient is limited most due to general pain, weakness, and fatigue. SpO2 while seated EOB on 5 Lpm (pt reported baseline) is 96%. After mobility, stats at 92% at lowest. Patient demo 3/5 score with LE MMT and is slightly unsteady on feet. Patient reports at baseline she uses scooter mostly and only walks  when there is someone there with her directly and receives assist with ADL/iADLs. Patient returned to bed at end of session due to fatigue and pain. Nursing staff notified. Patient will benefit from continued skilled physical therapy acutely and in recommended venue in order to address the above/below deficits to imrpove current level of function.        If plan is discharge home, recommend the following: A little help with walking and/or transfers;Assistance with cooking/housework;A little help with bathing/dressing/bathroom;Assist for transportation;Help with stairs or ramp for entrance   Can travel by private vehicle        Equipment Recommendations None recommended by PT  Recommendations for Other Services       Functional Status Assessment Patient has had a recent decline in their functional status and demonstrates the ability to make significant improvements in function in a reasonable and predictable amount of time.     Precautions / Restrictions Precautions Precautions: Fall Recall of Precautions/Restrictions: Intact Restrictions Weight Bearing Restrictions Per Provider Order: No      Mobility  Bed Mobility Overal bed mobility: Needs Assistance Bed Mobility: Supine to Sit, Sit to Supine     Supine to sit: Supervision, HOB elevated Sit to supine: Supervision, HOB elevated   General bed mobility comments: Pt demo labored, painful movement. HOB elevated to mimic home. Slight use of railings.    Transfers Overall transfer level: Needs assistance Equipment used: Rolling walker (2 wheels) Transfers: Sit to/from Stand, Bed to chair/wheelchair/BSC Sit to Stand: Contact guard assist   Step pivot transfers: Contact guard assist       General transfer comment: During weight bearing activities, CGA given for safety with  use of RW. Pt with labored movement that inc pain. Mild unsteadiness    Ambulation/Gait Ambulation/Gait assistance: Contact guard assist Gait  Distance (Feet): 20 Feet Assistive device: Rolling walker (2 wheels) Gait Pattern/deviations: Step-through pattern, Decreased step length - right, Decreased step length - left, Trunk flexed, Decreased stride length Gait velocity: Dec     General Gait Details: Pt limited to ambulation in room with RW adn the above deviations due to inc fatigue and pain with weight bearing, CGA for safety, mild unsteadiness of note  Stairs            Wheelchair Mobility     Tilt Bed    Modified Rankin (Stroke Patients Only)       Balance Overall balance assessment: Needs assistance Sitting-balance support: No upper extremity supported, Feet supported Sitting balance-Leahy Scale: Good Sitting balance - Comments: seated at EOB   Standing balance support: Bilateral upper extremity supported, During functional activity, Reliant on assistive device for balance Standing balance-Leahy Scale: Fair Standing balance comment: with RW and CGA           Pertinent Vitals/Pain Pain Assessment Pain Assessment: Faces Faces Pain Scale: Hurts a little bit Pain Location: B LE Pain Descriptors / Indicators: Discomfort, Guarding Pain Intervention(s): Limited activity within patient's tolerance, Monitored during session    Home Living Family/patient expects to be discharged to:: Private residence Living Arrangements: Spouse/significant other Available Help at Discharge: Family;Available PRN/intermittently Type of Home: House Home Access: Stairs to enter Entrance Stairs-Rails: None Entrance Stairs-Number of Steps: 1   Home Layout: One level Home Equipment: Agricultural consultant (2 wheels);Electric scooter;Shower seat;Hand held shower head;BSC/3in1 Additional Comments: Pt and husband confirm no change in home history since last visit    Prior Function Prior Level of Function : Needs assist       Physical Assist : Mobility (physical);ADLs (physical) Mobility (physical): Transfers;Gait;Stairs ADLs  (physical): Dressing;Bathing;Toileting;IADLs Mobility Comments: Pt reports as very short household community ambulator with RW, uses scooter mostly. Pt reports she does not walk without someone being there with her due to fear of falls. No falls in last 6 months but within 2 last years. Does not drive. ADLs Comments: Reports husband assists with ADLs.     Extremity/Trunk Assessment   Upper Extremity Assessment Upper Extremity Assessment: Defer to OT evaluation    Lower Extremity Assessment Lower Extremity Assessment: Generalized weakness (MMT 3/5 ankle DF bilat, 3/5 hip flexion bilat. Unable to hold prolonged amount of time or against resistance)    Cervical / Trunk Assessment Cervical / Trunk Assessment: Kyphotic  Communication   Communication Communication: No apparent difficulties    Cognition Arousal: Alert Behavior During Therapy: WFL for tasks assessed/performed   PT - Cognitive impairments: No apparent impairments         Following commands: Intact       Cueing Cueing Techniques: Verbal cues, Tactile cues     General Comments      Exercises     Assessment/Plan    PT Assessment Patient needs continued PT services;All further PT needs can be met in the next venue of care  PT Problem List Decreased strength;Decreased activity tolerance;Decreased balance;Decreased mobility       PT Treatment Interventions DME instruction;Balance training;Gait training;Functional mobility training;Therapeutic activities;Therapeutic exercise;Patient/family education    PT Goals (Current goals can be found in the Care Plan section)  Acute Rehab PT Goals Patient Stated Goal: Return home PT Goal Formulation: With patient Time For Goal Achievement: 04/27/24 Potential to Achieve Goals: Good  Frequency Min 3X/week     Co-evaluation PT/OT/SLP Co-Evaluation/Treatment: Yes Reason for Co-Treatment: To address functional/ADL transfers PT goals addressed during session:  Mobility/safety with mobility OT goals addressed during session: ADL's and self-care       AM-PAC PT 6 Clicks Mobility  Outcome Measure Help needed turning from your back to your side while in a flat bed without using bedrails?: A Little Help needed moving from lying on your back to sitting on the side of a flat bed without using bedrails?: A Little Help needed moving to and from a bed to a chair (including a wheelchair)?: A Little Help needed standing up from a chair using your arms (e.g., wheelchair or bedside chair)?: A Little Help needed to walk in hospital room?: A Little Help needed climbing 3-5 steps with a railing? : A Lot 6 Click Score: 17    End of Session Equipment Utilized During Treatment: Gait belt Activity Tolerance: Patient tolerated treatment well;Patient limited by fatigue;Patient limited by pain Patient left: in bed;with call bell/phone within reach Nurse Communication: Mobility status;Patient requests pain meds PT Visit Diagnosis: Unsteadiness on feet (R26.81);Other abnormalities of gait and mobility (R26.89);Muscle weakness (generalized) (M62.81)    Time: 9075-9057 PT Time Calculation (min) (ACUTE ONLY): 18 min   Charges:   PT Evaluation $PT Eval Low Complexity: 1 Low   PT General Charges $$ ACUTE PT VISIT: 1 Visit         11:30 AM, 04/24/24 Rosaria Settler, PT, DPT [place] with [facility]

## 2024-04-24 NOTE — Evaluation (Signed)
 Occupational Therapy Evaluation Patient Details Name: Kathleen [name] MRN: [id number] DOB: November 07, 1960 Today's Date: 04/24/2024   History of Present Illness   Kathleen [name]  is a 63 y.o. female, past medical history of interstitial lung disease, sarcoidosis, chronic respiratory failure with hypoxia on 5 L of oxygen  morbid obesity Addison's disease, bilateral hip avascular necrosis, lumbar back pain and lumbar compression fracture, status post recent hospitalization for kyphoplasty by IR and CIR discharge 02/25/2024.  - Was sent by her PCP, as she does appear to be with worsening urinary retention and lower extremity edema, patient with urinary retention at baseline, husband assisting her with self cath in and out permanently, but it does appear she has been requiring it more often, will she is with worsening lower extremity edema.  - In ED she was noted to have +3 lower extremity edema, she had Foley catheter inserted with 600 cc urine output, her UA is positive for pyuria, leukocytes and bacteriuria she is with history of Pseudomonas UTI (urine culture June/19/2025), her labs were significant for baseline hemoglobin at 9.8, chest x-ray with no acute findings, BNP within normal limit, CT abdomen pelvis with interval development of moderate compression deformity of L1, subacute to old fracture, and superior endplate depression L3, L4, L5 vertebral bodies concerning for subacute to old fractures.  [facility] hospitalist consulted to admit. (per MD)     Clinical Impressions Pt agreeable to OT and PT co-evaluation. Pt required CGA for bed mobility and ambulation in the room with RW while on 5 L supplemental O2. Pt saturation above 90% when in bed at the end of session. Pt assisted for bathing, dressing, and toileting at baseline. B UE WFL A/ROM but very weak today. Pt left in the bed with call bell within reach. Pt will benefit from continued OT in the hospital and recommended venue below to increase strength, balance,  and endurance for safe ADL's.        If plan is discharge home, recommend the following:   A little help with walking and/or transfers;A lot of help with bathing/dressing/bathroom;Assistance with cooking/housework;Assist for transportation;Help with stairs or ramp for entrance     Functional Status Assessment   Patient has had a recent decline in their functional status and demonstrates the ability to make significant improvements in function in a reasonable and predictable amount of time.     Equipment Recommendations   None recommended by OT             Precautions/Restrictions   Precautions Precautions: Fall Recall of Precautions/Restrictions: Intact Restrictions Weight Bearing Restrictions Per Provider Order: No     Mobility Bed Mobility Overal bed mobility: Needs Assistance Bed Mobility: Supine to Sit, Sit to Supine     Supine to sit: Supervision, HOB elevated Sit to supine: Supervision, HOB elevated   General bed mobility comments: labored movement    Transfers Overall transfer level: Needs assistance Equipment used: Rolling walker (2 wheels) Transfers: Sit to/from Stand, Bed to chair/wheelchair/BSC Sit to Stand: Contact guard assist     Step pivot transfers: Contact guard assist     General transfer comment: CGA assist for safety; no physical assist needed with RW      Balance Overall balance assessment: Needs assistance Sitting-balance support: No upper extremity supported, Feet supported Sitting balance-Leahy Scale: Good Sitting balance - Comments: seated at EOB   Standing balance support: Bilateral upper extremity supported, During functional activity, Reliant on assistive device for balance Standing balance-Leahy Scale: Fair Standing balance comment: using  RW                           ADL either performed or assessed with clinical judgement   ADL Overall ADL's : Needs assistance/impaired     Grooming: Set  up;Sitting   Upper Body Bathing: Set up;Sitting   Lower Body Bathing: Moderate assistance;Maximal assistance;Sitting/lateral leans   Upper Body Dressing : Set up;Sitting   Lower Body Dressing: Moderate assistance;Maximal assistance;Sitting/lateral leans   Toilet Transfer: Contact guard assist;Rolling walker (2 wheels);Ambulation Toilet Transfer Details (indicate cue type and reason): EOB to chair and ambulation in the room Toileting- Clothing Manipulation and Hygiene: Moderate assistance;Minimal assistance;Sitting/lateral lean       Functional mobility during ADLs: Contact guard assist;Rolling walker (2 wheels) General ADL Comments: Able to ambulate to the door and back with RW.     Vision Baseline Vision/History: 1 Wears glasses Ability to See in Adequate Light: 1 Impaired Patient Visual Report: No change from baseline Vision Assessment?: No apparent visual deficits     Perception Perception: Not tested       Praxis Praxis: Not tested       Pertinent Vitals/Pain Pain Assessment Pain Assessment: Faces Faces Pain Scale: Hurts a little bit Pain Location: B LE Pain Descriptors / Indicators: Discomfort, Guarding Pain Intervention(s): Monitored during session, Repositioned, Limited activity within patient's tolerance     Extremity/Trunk Assessment Upper Extremity Assessment Upper Extremity Assessment: Generalized weakness (3+/5 shoulder flexion; very weak B UE.)   Lower Extremity Assessment Lower Extremity Assessment: Defer to PT evaluation   Cervical / Trunk Assessment Cervical / Trunk Assessment: Kyphotic   Communication Communication Communication: No apparent difficulties   Cognition Arousal: Alert Behavior During Therapy: WFL for tasks assessed/performed Cognition: No apparent impairments                               Following commands: Intact       Cueing  General Comments   Cueing Techniques: Verbal cues;Tactile cues                   Home Living Family/patient expects to be discharged to:: Private residence Living Arrangements: Spouse/significant other Available Help at Discharge: Family;Available PRN/intermittently Type of Home: House Home Access: Stairs to enter [employer] of Steps: 1 Entrance Stairs-Rails: None Home Layout: One level     Bathroom Shower/Tub: Chief Strategy Officer: Handicapped height Bathroom Accessibility: Yes   Home Equipment: Agricultural consultant (2 wheels);Electric scooter;Shower seat;Hand held shower head;BSC/3in1   Additional Comments: No change from prior living history.      Prior Functioning/Environment Prior Level of Function : Needs assist       Physical Assist : Mobility (physical);ADLs (physical) Mobility (physical): Transfers;Gait;Stairs ADLs (physical): Dressing;Bathing;Toileting;IADLs Mobility Comments: short household distances using RW, scooter; walking only if someone present to provide supervision. ADLs Comments: Pt reports she is assited for bathing, dressing, and toileting. able to feed and groom    OT Problem List: Decreased strength;Decreased activity tolerance;Obesity;Cardiopulmonary status limiting activity   OT Treatment/Interventions: Self-care/ADL training;Therapeutic exercise;Therapeutic activities;Patient/family education      OT Goals(Current goals can be found in the care plan section)   Acute Rehab OT Goals Patient Stated Goal: return home OT Goal Formulation: With patient Time For Goal Achievement: 05/08/24 Potential to Achieve Goals: Good   OT Frequency:  Min 2X/week    Co-evaluation PT/OT/SLP Co-Evaluation/Treatment: Yes Reason for  Co-Treatment: To address functional/ADL transfers   OT goals addressed during session: ADL's and self-care      AM-PAC OT 6 Clicks Daily Activity     Outcome Measure Help from another person eating meals?: None Help from another person taking care of personal grooming?: A  Little Help from another person toileting, which includes using toliet, bedpan, or urinal?: A Little Help from another person bathing (including washing, rinsing, drying)?: A Lot Help from another person to put on and taking off regular upper body clothing?: A Little Help from another person to put on and taking off regular lower body clothing?: A Lot 6 Click Score: 17   End of Session Equipment Utilized During Treatment: Rolling walker (2 wheels);Gait belt;Oxygen   Activity Tolerance: Patient tolerated treatment well Patient left: with call bell/phone within reach;in bed  OT Visit Diagnosis: Unsteadiness on feet (R26.81);Other abnormalities of gait and mobility (R26.89);Muscle weakness (generalized) (M62.81)                Time: 9076-9055 OT Time Calculation (min): 21 min Charges:  OT General Charges $OT Visit: 1 Visit OT Evaluation $OT Eval Low Complexity: 1 Low  Kashara Blocher OT, MOT   Jayson Person 04/24/2024, 10:34 AM

## 2024-04-24 NOTE — Plan of Care (Signed)
  Problem: Acute Rehab PT Goals(only PT should resolve) Goal: Pt Will Go Supine/Side To Sit Outcome: Progressing Flowsheets (Taken 04/24/2024 1132) Pt will go Supine/Side to Sit: with modified independence Goal: Patient Will Transfer Sit To/From Stand Outcome: Progressing Flowsheets (Taken 04/24/2024 1132) Patient will transfer sit to/from stand: with supervision Goal: Pt Will Transfer Bed To Chair/Chair To Bed Outcome: Progressing Flowsheets (Taken 04/24/2024 1132) Pt will Transfer Bed to Chair/Chair to Bed: with supervision Goal: Pt Will Ambulate Outcome: Progressing Flowsheets (Taken 04/24/2024 1132) Pt will Ambulate:  50 feet  with rolling walker  with supervision  with contact guard assist    11:33 AM, 04/24/24 Rosaria Settler, PT, DPT [place] with [facility]

## 2024-04-24 NOTE — Progress Notes (Signed)
 Progress Note   Patient: Kathleen [name] FMW:[id number] DOB: May 09, 1961 DOA: 04/23/2024     0 DOS: the patient was seen and examined on 04/24/2024   Brief hospital admission narrative: As per H&P written by Dr. Sherlon on 04/23/2024 Kathleen [name]  is a 63 y.o. female, past medical history of interstitial lung disease, sarcoidosis, chronic respiratory failure with hypoxia on 5 L of oxygen  morbid obesity Addison's disease, bilateral hip avascular necrosis, lumbar back pain and lumbar compression fracture, status post recent hospitalization for kyphoplasty by IR and CIR discharge 02/25/2024. - Was sent by her PCP, as she does appear to be with worsening urinary retention and lower extremity edema, patient with urinary retention at baseline, husband assisting her with self cath in and out permanently, but it does appear she has been requiring it more often, will she is with worsening lower extremity edema. - In ED she was noted to have +3 lower extremity edema, she had Foley catheter inserted with 600 cc urine output, her UA is positive for pyuria, leukocytes and bacteriuria she is with history of Pseudomonas UTI (urine culture June/19/2025), her labs were significant for baseline hemoglobin at 9.8, chest x-ray with no acute findings, BNP within normal limit, CT abdomen pelvis with interval development of moderate compression deformity of L1, subacute to old fracture, and superior endplate depression L3, L4, L5 vertebral bodies concerning for subacute to old fractures.  [facility] hospitalist consulted to admit.    Assessment and plan 1-UTI/urinary retention - Foley catheter has been placed - At home patient chronically requiring In-N-Out catheterization - Continue IV antibiotics and follow culture result - Maintain adequate hydration - Follow clinical response.  2-worsening lower extremity edema - Venous Dopplers has rule out the presence of DVT - Will provide IV Lasix  to assist with swelling - Follow-up  2D echo - Low-sodium diet and adequate protein intake discussed with patient.  3-weakness/deconditioning - Appreciate assistance and recommendation by PT/OT - At the moment patient asking for home health PT at discharge - Continue supportive care.  4-sarcoidosis/interstitial lung disease/chronic hypoxic respiratory failure - On chronic oxygen  at baseline (4 to 5 L); stable - Continue the use of incentive spirometer/flutter valve - No acute findings appreciated on x-ray - Continue as needed mucolytic's.  5-vitamin D  deficiency/history of chronic lumbar compression fractures - Continue calcium  and vitamin D  supplementation - Continue outpatient Fosamax  - Patient with history of lumbar compression fractures; continue as needed analgesics.  6-morbid obesity -Body mass index is 37.71 kg/m.  -Low-calorie diet and portion control discussed with patient.  7-history of Addison's disease/chronic hypotension - Continue hydrocortisone  and midodrine . - Follow vital signs.  8-hyperlipidemia - Continue statin.  9-hypothyroidism - Continue Synthroid .  10-anxiety/depression - Resume home BuSpar , Atarax  and as needed low-dose Ativan   11-chronic diastolic heart failure - 2D echo has been ordered and will follow results - Patient with signs of increased lower extremity swelling most likely in the setting of hypoalbuminemia and venous insufficiency - BNP within normal limits and no vascular congestion appreciated on x-ray - Continue to follow daily weights/strict I's and O's.  12-history of left femoral head avascular necrosis/chronic pain - Patient is not a surgical candidate per orthopedic service - This is secondary to chronic use of steroids in the past - Continue supportive care. - Continue analgesics   Subjective:  No nausea, no vomiting, no fever.  Reporting general malaise, increase abdominal girth and lower extremity swelling.  No chest pain.  Good saturation on chronic  supplementation.  Physical Exam: Vitals:   04/24/24 0332 04/24/24 0440 04/24/24 0806 04/24/24 1343  BP: (!) 114/57   110/76  Pulse: 99   93  Resp: (!) 22   20  Temp: 98.4 F (36.9 C)   98.2 F (36.8 C)  TempSrc:    Oral  SpO2: 98%  94% 96%  Weight:  119.2 kg    Height:       General exam: Alert, awake, oriented x 3; chronically ill in appearance.  Anxious throughout examination. Respiratory system: No wheezing, no using accessory muscle.  Further movement appreciated bilaterally.  Good saturation on chronic 4-5 L supplementation. Cardiovascular system:RRR. No rubs or gallops. Gastrointestinal system: Abdomen is obese, nondistended, soft and nontender.  Increased abdominal girth appreciated on exam.  Positive bowel sounds. Central nervous system: 4 limbs spontaneously.  No focal neurological deficits. Extremities: No cyanosis or clubbing; 2-3+ edema appreciated bilaterally. Skin: No rashes, no petechiae. Psychiatry: Judgement and insight appear normal.  Flat affect on exam.   Data Reviewed: CBC: WBCs 7.2, hemoglobin 8.0 platelet count 43K Base metabolic panel: Sodium 142, potassium 3.9, chloride 98, bicarb 33, BUN 8, creatinine 0.82 and GFR >.  Anion gap 11.  Family Communication: Husband at bedside.  Disposition: Status is: Inpatient Remains inpatient appropriate because: Continue IV therapy.  Anticipating discharge back home with home health services once medically stable.   Time spent: 50 minutes  Author: Eric Nunnery, MD 04/24/2024 6:28 PM  For on call review [email or web address].

## 2024-04-24 NOTE — Progress Notes (Signed)
   04/24/24 1326  TOC Brief Assessment  Insurance and Status Reviewed  Patient has primary care physician Yes  Home environment has been reviewed From home  Prior level of function: Independent  Prior/Current Home Services No current home services  Social Drivers of Health Review SDOH reviewed no interventions necessary  Readmission risk has been reviewed Yes  Transition of care needs no transition of care needs at this time   HHPT recommended, [facility] accepted referral.

## 2024-04-25 DIAGNOSIS — N3000 Acute cystitis without hematuria: Secondary | ICD-10-CM | POA: Diagnosis not present

## 2024-04-25 DIAGNOSIS — E559 Vitamin D deficiency, unspecified: Secondary | ICD-10-CM | POA: Diagnosis not present

## 2024-04-25 DIAGNOSIS — I272 Pulmonary hypertension, unspecified: Secondary | ICD-10-CM | POA: Diagnosis not present

## 2024-04-25 DIAGNOSIS — R339 Retention of urine, unspecified: Secondary | ICD-10-CM | POA: Diagnosis not present

## 2024-04-25 LAB — URINE CULTURE: Culture: 80000 — AB

## 2024-04-25 MED ORDER — DOCUSATE SODIUM 100 MG PO CAPS
100.0000 mg | ORAL_CAPSULE | Freq: Two times a day (BID) | ORAL | Status: DC
  Administered 2024-04-25 – 2024-05-01 (×12): 100 mg via ORAL
  Filled 2024-04-25 (×12): qty 1

## 2024-04-25 MED ORDER — FUROSEMIDE 10 MG/ML IJ SOLN
20.0000 mg | Freq: Two times a day (BID) | INTRAMUSCULAR | Status: DC
  Administered 2024-04-25 – 2024-04-28 (×6): 20 mg via INTRAVENOUS
  Filled 2024-04-25 (×6): qty 2

## 2024-04-25 MED ORDER — LORAZEPAM 0.5 MG PO TABS
0.2500 mg | ORAL_TABLET | Freq: Three times a day (TID) | ORAL | Status: DC | PRN
  Administered 2024-04-25 – 2024-04-28 (×7): 0.25 mg via ORAL
  Filled 2024-04-25 (×7): qty 1

## 2024-04-25 NOTE — Progress Notes (Signed)
 Tele called patient had less than a 6 second run of SVT, heart rate 150. MD Ricky made aware. New order to continue to monitor patient.

## 2024-04-25 NOTE — Telephone Encounter (Signed)
 I would recommend vitamin D  OTC 1000 units daily I would like for the patient to do a metabolic 7 along with lipid and vitamin D  somewhere in September She should be doing a follow-up visit with myself approximately every 6 months

## 2024-04-25 NOTE — Progress Notes (Signed)
 Progress Note   Patient: Kathleen [name] FMW:[id number] DOB: [birth year] DOA: 04/23/2024     1 DOS: the patient was seen and examined on 04/25/2024   Brief hospital admission narrative: As per H&P written by Dr. Sherlon on 04/23/2024 Kathleen [name]  is a 63 y.o. female, past medical history of interstitial lung disease, sarcoidosis, chronic respiratory failure with hypoxia on 5 L of oxygen  morbid obesity Addison's disease, bilateral hip avascular necrosis, lumbar back pain and lumbar compression fracture, status post recent hospitalization for kyphoplasty by IR and CIR discharge 02/25/2024. - Was sent by her PCP, as she does appear to be with worsening urinary retention and lower extremity edema, patient with urinary retention at baseline, husband assisting her with self cath in and out permanently, but it does appear she has been requiring it more often, will she is with worsening lower extremity edema. - In ED she was noted to have +3 lower extremity edema, she had Foley catheter inserted with 600 cc urine output, her UA is positive for pyuria, leukocytes and bacteriuria she is with history of Pseudomonas UTI (urine culture June/19/2025), her labs were significant for baseline hemoglobin at 9.8, chest x-ray with no acute findings, BNP within normal limit, CT abdomen pelvis with interval development of moderate compression deformity of L1, subacute to old fracture, and superior endplate depression L3, L4, L5 vertebral bodies concerning for subacute to old fractures.  [facility] hospitalist consulted to admit.    Assessment and plan 1-UTI/urinary retention - Foley catheter has been placed - At home patient chronically requiring In-N-Out catheterization - Continue IV antibiotics and follow culture result/sensitivity. - Maintain adequate hydration - Follow clinical response.  2-worsening lower extremity edema - Venous Dopplers has rule out the presence of DVT - Will provide IV Lasix  to assist with  swelling - Echo demonstrating grade 1 diastolic dysfunction, positive increased pulmonary pressure and no significant valvular disorder.  Suboptimal images to rule out the presence of wall motion abnormalities. - Low-sodium diet and adequate protein intake discussed with patient.  3-weakness/deconditioning - Appreciate assistance and recommendation by PT/OT - At the moment patient asking for home health PT at discharge - Continue supportive care.  4-sarcoidosis/interstitial lung disease/chronic hypoxic respiratory failure - On chronic oxygen  at baseline (4 to 5 L); stable - Continue the use of incentive spirometer/flutter valve - No acute findings appreciated on x-ray - Continue as needed mucolytic's.  5-vitamin D  deficiency/history of chronic lumbar compression fractures - Continue calcium  and vitamin D  supplementation - Continue outpatient Fosamax  - Patient with history of lumbar compression fractures; continue as needed analgesics.  6-morbid obesity -Body mass index is 39.54 kg/m.  -Low-calorie diet and portion control discussed with patient.  7-history of Addison's disease/chronic hypotension - Continue hydrocortisone  and midodrine . - Follow vital signs.  8-hyperlipidemia - Continue statin.  9-hypothyroidism - Continue Synthroid .  10-anxiety/depression - Resume home BuSpar , Atarax  and as needed low-dose Ativan   11-chronic diastolic heart failure - 2D echo results demonstrating ejection fraction 60 to 65%; grade 1 diastolic dysfunction and without significant valvular disorder.  Elevated pulmonary pressures appreciated. - Patient with signs of increased lower extremity swelling most likely in the setting of hypoalbuminemia and venous insufficiency - BNP within normal limits and no vascular congestion appreciated on x-ray - Continue to follow daily weights/strict I's and O's.  12-history of left femoral head avascular necrosis/chronic pain - Patient is not a surgical  candidate per orthopedic service - This is secondary to chronic use of steroids in the past -  Continue supportive care. - Continue analgesics   Subjective:  Still with signs of fluid overload on exam; complaining of generalized weakness and malaise.  No chest pain, no nausea, no vomiting.  Patient is afebrile.  Physical Exam: Vitals:   04/25/24 0525 04/25/24 0832 04/25/24 1226 04/25/24 1313  BP: (!) 109/54  109/62 (!) 113/58  Pulse: 89  95 92  Resp: 18   18  Temp: 98.2 F (36.8 C)   98.4 F (36.9 C)  TempSrc: Oral   Oral  SpO2: 98% (!) 5%  95%  Weight: 125 kg     Height:       General exam: Alert, awake, oriented x 3; no nausea, no vomiting.  Reports feeling slightly better.  Still without signs of fluid overload Respiratory system: Fair air movement bilaterally; no frank crackles.  Decreased breath sounds at the bases.  Consideration of chronic supplementation. Cardiovascular system:RRR. No rubs or gallops; unable to assess JVD with body habitus. Gastrointestinal system: Abdomen is obese, nondistended, soft and nontender.  Positive bowel bowel sounds. Central nervous system: No focal neurological deficits. Extremities: No cyanosis or clubbing; 2+ edema appreciated bilaterally. Skin: No petechiae. Psychiatry: Judgement and insight appear normal.  Flat affect appreciated on exam.  Latest data Reviewed: CBC: WBCs 7.2, hemoglobin 8.0 platelet count 43K Base metabolic panel: Sodium 142, potassium 3.9, chloride 98, bicarb 33, BUN 8, creatinine 0.82 and GFR >.  Anion gap 11.  Family Communication: Husband at bedside.  Disposition: Status is: Inpatient Remains inpatient appropriate because: Continue IV therapy.  Anticipating discharge back home with home health services once medically stable.   Time spent: 50 minutes  Author: Eric Nunnery, MD 04/25/2024 3:21 PM  For on call review [email or web address].

## 2024-04-25 NOTE — Plan of Care (Signed)
  Problem: Education: Goal: Knowledge of General Education information will improve Description: Including pain rating scale, medication(s)/side effects and non-pharmacologic comfort measures Outcome: Progressing   Problem: Elimination: Goal: Will not experience complications related to bowel motility Outcome: Progressing   Problem: Pain Managment: Goal: General experience of comfort will improve and/or be controlled Outcome: Progressing

## 2024-04-25 NOTE — Plan of Care (Signed)

## 2024-04-26 ENCOUNTER — Inpatient Hospital Stay (HOSPITAL_COMMUNITY)

## 2024-04-26 DIAGNOSIS — N3000 Acute cystitis without hematuria: Secondary | ICD-10-CM | POA: Diagnosis not present

## 2024-04-26 DIAGNOSIS — M8000XG Age-related osteoporosis with current pathological fracture, unspecified site, subsequent encounter for fracture with delayed healing: Secondary | ICD-10-CM

## 2024-04-26 DIAGNOSIS — I272 Pulmonary hypertension, unspecified: Secondary | ICD-10-CM | POA: Diagnosis not present

## 2024-04-26 DIAGNOSIS — R339 Retention of urine, unspecified: Secondary | ICD-10-CM | POA: Diagnosis not present

## 2024-04-26 LAB — GLUCOSE, CAPILLARY: Glucose-Capillary: 137 mg/dL — ABNORMAL HIGH (ref 70–99)

## 2024-04-26 LAB — MAGNESIUM: Magnesium: 2.4 mg/dL (ref 1.7–2.4)

## 2024-04-26 LAB — BASIC METABOLIC PANEL WITH GFR
Anion gap: 11 (ref 5–15)
BUN: 10 mg/dL (ref 8–23)
CO2: 34 mmol/L — ABNORMAL HIGH (ref 22–32)
Calcium: 9 mg/dL (ref 8.9–10.3)
Chloride: 95 mmol/L — ABNORMAL LOW (ref 98–111)
Creatinine, Ser: 0.86 mg/dL (ref 0.44–1.00)
GFR, Estimated: >60 mL/min (ref >60)
Glucose, Bld: 129 mg/dL — ABNORMAL HIGH (ref 70–99)
Potassium: 3.7 mmol/L (ref 3.5–5.1)
Sodium: 140 mmol/L (ref 135–145)

## 2024-04-26 MED ORDER — FAMOTIDINE 20 MG PO TABS
20.0000 mg | ORAL_TABLET | Freq: Every day | ORAL | Status: DC
  Administered 2024-04-26 – 2024-04-28 (×3): 20 mg via ORAL
  Filled 2024-04-26 (×3): qty 1

## 2024-04-26 MED ORDER — PROCHLORPERAZINE EDISYLATE 10 MG/2ML IJ SOLN
10.0000 mg | Freq: Four times a day (QID) | INTRAMUSCULAR | Status: DC | PRN
  Administered 2024-04-28: 10 mg via INTRAVENOUS
  Filled 2024-04-26: qty 2

## 2024-04-26 NOTE — Progress Notes (Signed)
 Progress Note   Patient: Kathleen [name] FMW:[id number] DOB: 31-Jul-1961 DOA: 04/23/2024     2 DOS: the patient was seen and examined on 04/26/2024   Brief hospital admission narrative: As per H&P written by Dr. Sherlon on 04/23/2024 Kathleen [name]  is a 63 y.o. female, past medical history of interstitial lung disease, sarcoidosis, chronic respiratory failure with hypoxia on 5 L of oxygen  morbid obesity Addison's disease, bilateral hip avascular necrosis, lumbar back pain and lumbar compression fracture, status post recent hospitalization for kyphoplasty by IR and CIR discharge 02/25/2024. - Was sent by her PCP, as she does appear to be with worsening urinary retention and lower extremity edema, patient with urinary retention at baseline, husband assisting her with self cath in and out permanently, but it does appear she has been requiring it more often, will she is with worsening lower extremity edema. - In ED she was noted to have +3 lower extremity edema, she had Foley catheter inserted with 600 cc urine output, her UA is positive for pyuria, leukocytes and bacteriuria she is with history of Pseudomonas UTI (urine culture June/19/2025), her labs were significant for baseline hemoglobin at 9.8, chest x-ray with no acute findings, BNP within normal limit, CT abdomen pelvis with interval development of moderate compression deformity of L1, subacute to old fracture, and superior endplate depression L3, L4, L5 vertebral bodies concerning for subacute to old fractures.  [facility] hospitalist consulted to admit.    Assessment and plan 1-UTI/urinary retention - Foley catheter has been placed - At home patient chronically requiring In-N-Out catheterization - Continue IV antibiotics and follow culture result/sensitivity. - Maintain adequate hydration - Follow clinical response.  2-worsening lower extremity edema - Venous Dopplers has rule out the presence of DVT - Will provide IV Lasix  to assist with  swelling - Echo demonstrating grade 1 diastolic dysfunction, positive increased pulmonary pressure and no significant valvular disorder.  Suboptimal images to rule out the presence of wall motion abnormalities. - Low-sodium diet and adequate protein intake discussed with patient.  3-weakness/deconditioning - Appreciate assistance and recommendation by PT/OT - At the moment patient asking for home health PT at discharge - Continue supportive care.  4-sarcoidosis/interstitial lung disease/chronic hypoxic respiratory failure - On chronic oxygen  at baseline (4 to 5 L); stable - Continue the use of incentive spirometer/flutter valve - No acute findings appreciated on x-ray - Continue as needed mucolytic's.  5-vitamin D  deficiency/history of chronic lumbar compression fractures - Continue calcium  and vitamin D  supplementation - Continue outpatient Fosamax  - Patient with history of lumbar compression fractures; continue as needed analgesics.  6-morbid obesity -Body mass index is 37.55 kg/m.  -Low-calorie diet and portion control discussed with patient.  7-history of Addison's disease/chronic hypotension - Continue hydrocortisone  and midodrine . - Follow vital signs.  8-hyperlipidemia - Continue statin.  9-hypothyroidism - Continue Synthroid .  10-anxiety/depression - Resume home BuSpar , Atarax  and as needed low-dose Ativan   11-chronic diastolic heart failure - 2D echo results demonstrating ejection fraction 60 to 65%; grade 1 diastolic dysfunction and without significant valvular disorder.  Elevated pulmonary pressures appreciated. - Patient with signs of increased lower extremity swelling most likely in the setting of hypoalbuminemia and venous insufficiency - BNP within normal limits and no vascular congestion appreciated on x-ray - Continue to follow daily weights/strict I's and O's.  12-history of left femoral head avascular necrosis/chronic pain - Patient is not a surgical  candidate per orthopedic service - This is secondary to chronic use of steroids in the past -  Continue supportive care. - Continue analgesics  13-constipation/abdominal pain/intermittent nausea - Will check CT scan of abdomen to be fluoroscopy - Adding PPI to her regimen and continue as needed antiemetics - Patient on MiraLAX  and Colace; instructed to increase physical activity.   Subjective:  No chest pain, no fever and no vomiting.  Reported intermittent nausea, abdominal discomfort, complaints of constipation and expressing good urine output.  Physical Exam: Vitals:   04/25/24 1313 04/25/24 1959 04/26/24 0539 04/26/24 0817  BP: (!) 113/58 (!) 108/40 (!) 127/58   Pulse: 92 90 (!) 104   Resp: 18 16 20    Temp: 98.4 F (36.9 C) 98.1 F (36.7 C) 98.6 F (37 C)   TempSrc: Oral Oral Oral   SpO2: 95% 95% 90% 96%  Weight:   118.7 kg   Height:       General exam: Alert, awake, oriented x 3; reporting abdominal discomfort for, intermittent nausea and constipation. Respiratory system: Good saturation on chronic supplementation. Cardiovascular system:RRR. No murmurs, rubs, gallops. Gastrointestinal system: Abdomen is obese, nondistended, soft and vague tenderness on palpation.  No guarding.  Positive bowel sounds. Central nervous system: Moving 4 limbs abdomen sinus rhythm.  No focal neurological deficits. Extremities: No cyanosis or clubbing; 1-2+ edema appreciated bilaterally. Skin: No petechiae. Psychiatry: Judgement and insight appear normal.  Flat affect appreciated on exam.  Latest data Reviewed: CBC: WBCs 7.2, hemoglobin 8.0 platelet count 43K Base metabolic panel: Sodium 140, potassium 3.7, chloride 95, bicarb 34, BUN 10, creatinine 0.86 and GFR >60 Magnesium : 2.4  Family Communication: Husband at bedside.  Disposition: Status is: Inpatient Remains inpatient appropriate because: Continue IV therapy.  Anticipating discharge back home with home health services once  medically stable.   Time spent: 50 minutes  Author: Eric Nunnery, MD 04/26/2024 1:49 PM  For on call review [email or web address].

## 2024-04-26 NOTE — Plan of Care (Signed)

## 2024-04-27 DIAGNOSIS — I272 Pulmonary hypertension, unspecified: Secondary | ICD-10-CM | POA: Diagnosis not present

## 2024-04-27 DIAGNOSIS — R339 Retention of urine, unspecified: Secondary | ICD-10-CM | POA: Diagnosis not present

## 2024-04-27 DIAGNOSIS — M8000XG Age-related osteoporosis with current pathological fracture, unspecified site, subsequent encounter for fracture with delayed healing: Secondary | ICD-10-CM | POA: Diagnosis not present

## 2024-04-27 DIAGNOSIS — N3000 Acute cystitis without hematuria: Secondary | ICD-10-CM | POA: Diagnosis not present

## 2024-04-27 MED ORDER — LACTULOSE ENEMA: 300.0000 mL | Freq: Every day | ORAL | Status: DC | PRN

## 2024-04-27 NOTE — Progress Notes (Signed)
 Progress Note   Patient: Kathleen [name] FMW:[id number] DOB: [birth year] DOA: 04/23/2024     3 DOS: the patient was seen and examined on 04/27/2024   Brief hospital admission narrative: As per H&P written by Dr. Sherlon on 04/23/2024 Kathleen [name]  is a 63 y.o. female, past medical history of interstitial lung disease, sarcoidosis, chronic respiratory failure with hypoxia on 5 L of oxygen  morbid obesity Addison's disease, bilateral hip avascular necrosis, lumbar back pain and lumbar compression fracture, status post recent hospitalization for kyphoplasty by IR and CIR discharge 02/25/2024. - Was sent by her PCP, as she does appear to be with worsening urinary retention and lower extremity edema, patient with urinary retention at baseline, husband assisting her with self cath in and out permanently, but it does appear she has been requiring it more often, will she is with worsening lower extremity edema. - In ED she was noted to have +3 lower extremity edema, she had Foley catheter inserted with 600 cc urine output, her UA is positive for pyuria, leukocytes and bacteriuria she is with history of Pseudomonas UTI (urine culture June/19/2025), her labs were significant for baseline hemoglobin at 9.8, chest x-ray with no acute findings, BNP within normal limit, CT abdomen pelvis with interval development of moderate compression deformity of L1, subacute to old fracture, and superior endplate depression L3, L4, L5 vertebral bodies concerning for subacute to old fractures.  [facility] hospitalist consulted to admit.    Assessment and plan 1-UTI/urinary retention - Foley catheter has been placed - At home patient chronically requiring In-N-Out catheterization - Continue IV antibiotics and follow culture result/sensitivity. - Maintain adequate hydration - Continue to follow clinical response.  2-worsening lower extremity edema - Venous Dopplers has rule out the presence of DVT - Will provide IV Lasix  to assist  with swelling; with ongoing improvement in fluid overload status planning to transition to oral route in the next 24-48 hours. - Echo demonstrating grade 1 diastolic dysfunction, positive increased pulmonary pressure and no significant valvular disorder.  Suboptimal images to rule out the presence of wall motion abnormalities. - Low-sodium diet and adequate protein intake discussed with patient.  3-weakness/deconditioning - Appreciate assistance and recommendation by PT/OT - At the moment patient asking for home health PT at discharge - Continue supportive care.  4-sarcoidosis/interstitial lung disease/chronic hypoxic respiratory failure - On chronic oxygen  at baseline (4 to 5 L); stable - Continue the use of incentive spirometer/flutter valve - No acute findings appreciated on x-ray - Continue as needed mucolytic's.  5-vitamin D  deficiency/history of chronic lumbar compression fractures - Continue calcium  and vitamin D  supplementation - Continue outpatient Fosamax  - Patient with history of lumbar compression fractures; continue as needed analgesics.  6-morbid obesity -Body mass index is 36.73 kg/m.  -Low-calorie diet and portion control discussed with patient.  7-history of Addison's disease/chronic hypotension - Continue hydrocortisone  and midodrine . - Follow vital signs.  8-hyperlipidemia - Continue statin.  9-hypothyroidism - Continue Synthroid .  10-anxiety/depression - Resume home BuSpar , Atarax  and as needed low-dose Ativan   11-chronic diastolic heart failure - 2D echo results demonstrating ejection fraction 60 to 65%; grade 1 diastolic dysfunction and without significant valvular disorder.  Elevated pulmonary pressures appreciated. - Patient with signs of increased lower extremity swelling most likely in the setting of hypoalbuminemia and venous insufficiency - BNP within normal limits and no vascular congestion appreciated on x-ray - Continue to follow daily  weights/strict I's and O's.  12-history of left femoral head avascular necrosis/chronic pain - Patient is  not a surgical candidate per orthopedic service - This is secondary to chronic use of steroids in the past - Continue supportive care. - Continue analgesics  13-constipation/abdominal pain/intermittent nausea - CT scan demonstrating ileus versus partial SBO - Patient reports last bowel movement on Friday - Increase physical activity, treatment with Linzess , adjusted dose of MiraLAX  and Colace and continue use of PPI and Pepcid  will be provided - Continue as needed antiemetics - Follow clinical response - Discussed with patient possible necessity of going n.p.o. status if symptoms persist.   Subjective:  Good urine output; improvement in overall fluid overload signs appreciated.  Complaining of intermittent nausea, abdominal discomfort and vomiting.  Physical Exam: Vitals:   04/27/24 0536 04/27/24 0739 04/27/24 0857 04/27/24 1256  BP:   (!) 106/44 (!) 110/57  Pulse:   91 89  Resp:    18  Temp:   97.7 F (36.5 C) 98.1 F (36.7 C)  TempSrc:   Oral Oral  SpO2:  96% 95% 95%  Weight: 116.1 kg     Height:       General exam: Alert, awake, oriented x 3; reporting some intermittent nausea/vomiting and abdominal discomfort.  Reports last bowel movement on Friday. Respiratory system: Good air movement bilaterally; no frank crackles.  Mild expiratory wheezing appreciated on exam.  No using accessory muscle. Cardiovascular system:RRR. No rubs or gallops; no JVD. Gastrointestinal system: Abdomen is slightly distended and with the presence of increased abdominal girth;, soft and diffuse vague tenderness on palpation..  Obese, no guarding, positive bowel sounds. Central nervous system: No focal neurological deficits. Extremities: No cyanosis or clubbing.  Trace to 1+ edema appreciated bilaterally. Skin: No petechiae. Psychiatry: Judgement and insight appear normal.  Flat affect on  exam.  Latest data Reviewed: CBC: WBCs 7.2, hemoglobin 8.0 platelet count 43K Base metabolic panel: Sodium 140, potassium 3.7, chloride 95, bicarb 34, BUN 10, creatinine 0.86 and GFR >60 Magnesium : 2.4  Family Communication: Husband at bedside.  Disposition: Status is: Inpatient Remains inpatient appropriate because: Continue IV therapy.  Anticipating discharge back home with home health services once medically stable.   Time spent: 50 minutes  Author: Eric Nunnery, MD 04/27/2024 4:59 PM  For on call review [email or web address].

## 2024-04-27 NOTE — TOC Initial Note (Signed)
 Transition of Care [facility]) - Initial/Assessment Note    Patient Details  Name: Kathleen [name] MRN: [id number] Date of Birth: 1960/10/29  [facility] CM/SW Contact:    Lucie Lunger, LCSWA Phone Number: 04/27/2024, 10:00 AM  Clinical Narrative:                 CSW notes pt is high risk for readmission. Pt is known to [facility] from past hospital admissions. Pt is from home with her spouse. Pt has assistance with ADLs. Pt has needed DME in the home. Pt has HH PT with [facility], new orders placed by MD. TOC to follow.   Expected Discharge Plan: Home w Home Health Services Barriers to Discharge: Continued Medical Work up   Patient Goals and CMS Choice Patient states their goals for this hospitalization and ongoing recovery are:: return home CMS Medicare.gov Compare Post Acute Care list provided to:: Patient Choice offered to / list presented to : Patient      Expected Discharge Plan and Services In-house Referral: Clinical Social Work Discharge Planning Services: CM Consult Post Acute Care Choice: Home Health Living arrangements for the past 2 months: Single Family Home                           HH Arranged: PT HH Agency: [facility] Date [facility] Agency Contacted: 04/27/24   Representative spoke with at [facility] Agency: Darleene  Prior Living Arrangements/Services Living arrangements for the past 2 months: Single Family Home Lives with:: Spouse Patient language and need for interpreter reviewed:: Yes Do you feel safe going back to the place where you live?: Yes      Need for Family Participation in Patient Care: Yes (Comment) Care giver support system in place?: Yes (comment) Current home services: DME, Home PT Criminal Activity/Legal Involvement Pertinent to Current Situation/Hospitalization: No - Comment as needed  Activities of Daily Living   ADL Screening (condition at time of admission) Independently performs ADLs?: Yes (appropriate for developmental age) Is  the patient deaf or have difficulty hearing?: No Does the patient have difficulty seeing, even when wearing glasses/contacts?: No Does the patient have difficulty concentrating, remembering, or making decisions?: No  Permission Sought/Granted                  Emotional Assessment Appearance:: Appears stated age Attitude/Demeanor/Rapport: Engaged Affect (typically observed): Accepting Orientation: : Oriented to Self, Oriented to Place, Oriented to  Time, Oriented to Situation Alcohol / Substance Use: Not Applicable Psych Involvement: No (comment)  Admission diagnosis:  Urinary retention [R33.9] UTI (urinary tract infection) [N39.0] Acute cystitis without hematuria [N30.00] Patient Active Problem List   Diagnosis Date Noted   UTI (urinary tract infection) 04/23/2024   Osteoporosis with current pathological fracture 03/17/2024   Anxiety 02/11/2024   Spinal compression fracture (HCC) 01/29/2024   Lower back pain 01/26/2024   Closed compression fracture of L3 lumbar vertebra, initial encounter (HCC) 01/21/2024   Intractable low back pain 01/03/2024   Ambulatory dysfunction 12/11/2023   Uncontrolled pain 12/08/2023   Pressure injury of skin 12/08/2023   Intractable back pain 12/07/2023   Prediabetes 10/28/2023   Ankle fracture, bimalleolar, closed, left, sequela 03/10/2023   Closed fracture of left ankle 03/10/2023   Syndesmotic disruption of ankle, left, initial encounter 03/10/2023   Lumbar transverse process fracture (HCC) 01/02/2023   Acute on chronic hypoxic respiratory failure (HCC) 12/13/2022   Depression, major, single episode, moderate (HCC) 09/10/2022  Aortic atherosclerosis (HCC) 09/10/2022   Chronic respiratory failure with hypoxia (HCC) 05/22/2021   Anxiety and depression 05/22/2021   Vitamin D  deficiency 05/09/2021   Left foot drop 01/09/2021   Pulmonary HTN (HCC) 10/12/2020   Encounter for long-term opiate analgesic use 02/02/2020   Interstitial lung  disease (HCC) 04/14/2019   GERD (gastroesophageal reflux disease) 10/15/2017   Personal history of noncompliance with medical treatment, presenting hazards to health 04/26/2017   Hypothyroidism 01/15/2017   Adrenal insufficiency (Addison's disease) (HCC) 08/22/2016   Symptomatic bradycardia    Adrenal insufficiency (HCC) 08/17/2016   Hypokalemia 08/17/2016   Chronic pain syndrome 04/20/2016   Irritable bowel syndrome with constipation 11/14/2015   Avascular necrosis of bone of hip, left (HCC) 04/05/2015   Post herpetic neuralgia 10/22/2014   Mixed hyperlipidemia 08/25/2014   Obstructive sleep apnea 02/18/2014   Fibromyalgia 02/11/2013   Bilateral lower extremity edema 01/22/2013   Hypotension 01/16/2013   Fibrocystic breast disease in female 04/09/2012   Sarcoidosis 06/21/2008   PCP:  Alphonsa Glendia LABOR, MD Pharmacy:   [facility] [address] [place] [address] Phone: [phone] Fax: [phone]  [facility] [address] [place] [address] Phone: [phone] Fax: [phone]  [facility] [address] [place] [address] Phone: [phone] Fax: [phone]     Social Drivers of Health (SDOH) Social History: SDOH Screenings   Food Insecurity: No Food Insecurity (04/23/2024)  Housing: Low Risk  (04/23/2024)  Transportation Needs: No Transportation Needs (04/23/2024)  Utilities: Not At Risk (04/23/2024)  Alcohol Screen: Low Risk  (09/27/2023)  Depression (PHQ2-9): Medium Risk (04/23/2024)  Financial Resource Strain: Low Risk  (09/27/2023)  Physical Activity: Inactive (09/27/2023)  Social Connections: Socially Integrated (01/21/2024)  Stress: No Stress Concern Present (09/27/2023)  Tobacco Use: Low Risk  (04/24/2024)  Health Literacy: Medium Risk (12/20/2023)   Received from [facility]   SDOH Interventions:     Readmission Risk  Interventions    04/27/2024    9:59 AM 01/27/2024   11:48 AM 12/11/2023   11:11 AM  Readmission Risk Prevention Plan  Transportation Screening Complete Complete Complete  HRI or Home Care Consult   Complete  Social Work Consult for Recovery Care Planning/Counseling   Complete  Palliative Care Screening   Not Applicable  Medication Review Oceanographer) Complete Complete Complete  HRI or Home Care Consult Complete Complete   SW Recovery Care/Counseling Consult Complete Complete   Palliative Care Screening Not Applicable Not Applicable   Skilled Nursing Facility Not Applicable Patient Refused

## 2024-04-27 NOTE — Plan of Care (Signed)
   Problem: Activity: Goal: Risk for activity intolerance will decrease Outcome: Progressing   Problem: Coping: Goal: Level of anxiety will decrease Outcome: Progressing

## 2024-04-27 NOTE — Progress Notes (Signed)
 Physical Therapy Note  Patient Details  Name: Kathleen [name] MRN: [id number] Date of Birth: 1961-05-30 Today's Date: 04/27/2024    Attempted 3 times for therapy but originally requested return and other times was sleeping with family also in room sleeping   Vivian, Virginia B 04/27/2024, 12:12 PM

## 2024-04-27 NOTE — Plan of Care (Signed)

## 2024-04-28 ENCOUNTER — Other Ambulatory Visit: Payer: Self-pay

## 2024-04-28 ENCOUNTER — Other Ambulatory Visit (HOSPITAL_COMMUNITY): Payer: Self-pay

## 2024-04-28 DIAGNOSIS — I272 Pulmonary hypertension, unspecified: Secondary | ICD-10-CM | POA: Diagnosis not present

## 2024-04-28 DIAGNOSIS — E782 Mixed hyperlipidemia: Secondary | ICD-10-CM

## 2024-04-28 DIAGNOSIS — M8000XG Age-related osteoporosis with current pathological fracture, unspecified site, subsequent encounter for fracture with delayed healing: Secondary | ICD-10-CM | POA: Diagnosis not present

## 2024-04-28 DIAGNOSIS — I959 Hypotension, unspecified: Secondary | ICD-10-CM

## 2024-04-28 DIAGNOSIS — R339 Retention of urine, unspecified: Secondary | ICD-10-CM | POA: Diagnosis not present

## 2024-04-28 DIAGNOSIS — N390 Urinary tract infection, site not specified: Secondary | ICD-10-CM | POA: Diagnosis not present

## 2024-04-28 DIAGNOSIS — E559 Vitamin D deficiency, unspecified: Secondary | ICD-10-CM

## 2024-04-28 LAB — BASIC METABOLIC PANEL WITH GFR
Anion gap: 14 (ref 5–15)
BUN: 9 mg/dL (ref 8–23)
CO2: 31 mmol/L (ref 22–32)
Calcium: 9 mg/dL (ref 8.9–10.3)
Chloride: 91 mmol/L — ABNORMAL LOW (ref 98–111)
Creatinine, Ser: 0.9 mg/dL (ref 0.44–1.00)
GFR, Estimated: >60 mL/min (ref >60)
Glucose, Bld: 126 mg/dL — ABNORMAL HIGH (ref 70–99)
Potassium: 3.2 mmol/L — ABNORMAL LOW (ref 3.5–5.1)
Sodium: 136 mmol/L (ref 135–145)

## 2024-04-28 MED ORDER — POTASSIUM CHLORIDE CRYS ER 20 MEQ PO TBCR
40.0000 meq | EXTENDED_RELEASE_TABLET | Freq: Once | ORAL | Status: AC
  Administered 2024-04-28: 40 meq via ORAL
  Filled 2024-04-28: qty 2

## 2024-04-28 MED ORDER — FUROSEMIDE 20 MG PO TABS
20.0000 mg | ORAL_TABLET | Freq: Every day | ORAL | Status: DC
  Administered 2024-04-28 – 2024-05-01 (×4): 20 mg via ORAL
  Filled 2024-04-28 (×4): qty 1

## 2024-04-28 MED ORDER — LORAZEPAM 0.5 MG PO TABS
0.2500 mg | ORAL_TABLET | Freq: Two times a day (BID) | ORAL | Status: DC | PRN
  Administered 2024-04-29 – 2024-05-01 (×4): 0.25 mg via ORAL
  Filled 2024-04-28 (×4): qty 1

## 2024-04-28 NOTE — Progress Notes (Signed)
 Increased call light use this shift 04/28/2024 and yesterday shift 04/27/2024 for requested such as: turning in the bed and needing or PRNs. Pt is able to turn self with minimal assistance

## 2024-04-28 NOTE — Plan of Care (Signed)
   Problem: Activity: Goal: Risk for activity intolerance will decrease Outcome: Progressing   Problem: Coping: Goal: Level of anxiety will decrease Outcome: Progressing

## 2024-04-28 NOTE — Progress Notes (Signed)
 Progress Note   Patient: Kathleen [name] FMW:[id number] DOB: 04-Jul-1961 DOA: 04/23/2024     4 DOS: the patient was seen and examined on 04/28/2024   Brief hospital admission narrative: As per H&P written by Dr. Sherlon on 04/23/2024 Kathleen [name]  is a 63 y.o. female, past medical history of interstitial lung disease, sarcoidosis, chronic respiratory failure with hypoxia on 5 L of oxygen  morbid obesity Addison's disease, bilateral hip avascular necrosis, lumbar back pain and lumbar compression fracture, status post recent hospitalization for kyphoplasty by IR and CIR discharge 02/25/2024. - Was sent by her PCP, as she does appear to be with worsening urinary retention and lower extremity edema, patient with urinary retention at baseline, husband assisting her with self cath in and out permanently, but it does appear she has been requiring it more often, will she is with worsening lower extremity edema. - In ED she was noted to have +3 lower extremity edema, she had Foley catheter inserted with 600 cc urine output, her UA is positive for pyuria, leukocytes and bacteriuria she is with history of Pseudomonas UTI (urine culture June/19/2025), her labs were significant for baseline hemoglobin at 9.8, chest x-ray with no acute findings, BNP within normal limit, CT abdomen pelvis with interval development of moderate compression deformity of L1, subacute to old fracture, and superior endplate depression L3, L4, L5 vertebral bodies concerning for subacute to old fractures.  [facility] hospitalist consulted to admit.    Assessment and plan 1-UTI/urinary retention - Foley catheter has been placed - At home patient chronically requiring In-N-Out catheterization - Patient will complete IV antibiotics after today's dosages. -Continue to maintain adequate hydration - Outpatient follow-up with urology service will be recommended in morning to have further evaluation of her bladder dysmotility. - Continue to follow  clinical response.  2-worsening lower extremity edema - Venous Dopplers has rule out the presence of DVT - Will transition to oral diuretics and follow urine output response. - Echo demonstrating grade 1 diastolic dysfunction, positive increased pulmonary pressure and no significant valvular disorder.  Suboptimal images to rule out the presence of wall motion abnormalities. - Continue low-sodium diet and adequate protein intake discussed with patient.  3-weakness/deconditioning - Appreciate assistance and recommendation by PT/OT - At the moment patient asking for home health PT at discharge - Continue supportive care while inpatient.SABRA  4-sarcoidosis/interstitial lung disease/chronic hypoxic respiratory failure - On chronic oxygen  at baseline (4 to 5 L); stable - Continue the use of incentive spirometer/flutter valve - No acute findings appreciated on x-ray - Continue as needed mucolytic's.  5-vitamin D  deficiency/history of chronic lumbar compression fractures - Continue calcium  and vitamin D  supplementation - Continue outpatient Fosamax  - Patient with history of lumbar compression fractures; continue as needed analgesics.  6-morbid obesity -Body mass index is 37.2 kg/m.  -Low-calorie diet and portion control discussed with patient.  7-history of Addison's disease/chronic hypotension - Continue hydrocortisone  and midodrine . - Follow vital signs.  8-hyperlipidemia - Continue statin.  9-hypothyroidism - Continue Synthroid .  10-anxiety/depression - Resume home BuSpar , Atarax  and as needed low-dose Ativan   11-chronic diastolic heart failure - 2D echo results demonstrating ejection fraction 60 to 65%; grade 1 diastolic dysfunction and without significant valvular disorder.  Elevated pulmonary pressures appreciated. - Patient with signs of increased lower extremity swelling most likely in the setting of hypoalbuminemia and venous insufficiency - BNP within normal limits and no  vascular congestion appreciated on x-ray - Continue to follow daily weights/strict I's and O's.  12-history of left  femoral head avascular necrosis/chronic pain - Patient is not a surgical candidate per orthopedic service - This is secondary to chronic use of steroids in the past - Continue supportive care. - Continue analgesics  13-constipation/abdominal pain/intermittent nausea - CT scan demonstrating ileus versus partial SBO - Patient reports last bowel movement on Friday - Increase physical activity, treatment with Linzess , adjusted dose of MiraLAX  and Colace and continue use of PPI and Pepcid  will be provided - Continue as needed antiemetics - Continue to follow clinical response. - Overall feeling better.  Since   Subjective:  Significant improvement appreciated in patient's overall follow-up below; no chest pain, no vomiting and no fever.  Patient denies dysuria.  Still reporting intermittent nausea, but expressed improvement in her abdominal discomfort after achieving a good bowel movement on 04/27/2024.  Physical Exam: Vitals:   04/27/24 1256 04/27/24 1953 04/28/24 0411 04/28/24 0811  BP: (!) 110/57 (!) 107/55 (!) 111/55   Pulse: 89 77 95   Resp: 18 18 18    Temp: 98.1 F (36.7 C) 98.8 F (37.1 C) 98 F (36.7 C)   TempSrc: Oral Axillary Oral   SpO2: 95% 95% 97% 99%  Weight:   117.6 kg   Height:   5' 10 (1.778 m)    General exam: Alert, awake, oriented x 3; in no acute distress.  Reported improvement in her overall fluid overload status. Respiratory system: Good saturation on chronic supplementation; no frank crackles, no using accessory muscles. Cardiovascular system: No rubs, no gallops, RRR. Gastrointestinal system: Abdomen is obese, no guarding, soft and nontender.  Positive bowel sounds. Central nervous system:  No focal neurological deficits. Extremities: No cyanosis or clubbing. Skin: No petechiae. Psychiatry: Judgement and insight appear normal.  Flat affect  appreciated on exam.  Latest data Reviewed: CBC: WBCs 7.2, hemoglobin 8.0 platelet count 43K Magnesium : 2.4 Basic metabolic panel: Sodium 136, potassium 3.2, chloride 91, bicarb 21, BUN 9, creatinine 0.90 and GFR >60  Family Communication: Husband at bedside.  Disposition: Status is: Inpatient Remains inpatient appropriate because: Continue IV therapy.  Anticipating discharge back home with home health services once medically stable.   Time spent: 50 minutes  Author: Eric Nunnery, MD 04/28/2024 12:56 PM  For on call review [email or web address].

## 2024-04-28 NOTE — Progress Notes (Signed)
 Occupational Therapy Treatment Patient Details Name: Kathleen [name] MRN: [id number] DOB: 19-Jan-1961 Today's Date: 04/28/2024   History of present illness Kathleen [name]  is a 63 y.o. female, past medical history of interstitial lung disease, sarcoidosis, chronic respiratory failure with hypoxia on 5 L of oxygen  morbid obesity Addison's disease, bilateral hip avascular necrosis, lumbar back pain and lumbar compression fracture, status post recent hospitalization for kyphoplasty by IR and CIR discharge 02/25/2024.  - Was sent by her PCP, as she does appear to be with worsening urinary retention and lower extremity edema, patient with urinary retention at baseline, husband assisting her with self cath in and out permanently, but it does appear she has been requiring it more often, will she is with worsening lower extremity edema.  - In ED she was noted to have +3 lower extremity edema, she had Foley catheter inserted with 600 cc urine output, her UA is positive for pyuria, leukocytes and bacteriuria she is with history of Pseudomonas UTI (urine culture June/19/2025), her labs were significant for baseline hemoglobin at 9.8, chest x-ray with no acute findings, BNP within normal limit, CT abdomen pelvis with interval development of moderate compression deformity of L1, subacute to old fracture, and superior endplate depression L3, L4, L5 vertebral bodies concerning for subacute to old fractures.  [facility] hospitalist consulted to admit.   OT comments  Pt agreeable to OT treatment but very lethargic today. Able to wake to sit at EOB with min A today. Pt reported feeling dizzy but was able to stand with CGA to min A followed by more CGA for steps to chair using RW. B UE strengthening attempted but pt was very lethargic and was limited to x10 reps of protraction and AA/ROM for flexion largely due to lethargy. Pt left in the chair with call bell within reach. Pt will benefit from continued OT in the hospital and recommended  venue below to increase strength, balance, and endurance for safe ADL's.         If plan is discharge home, recommend the following:  A little help with walking and/or transfers;A lot of help with bathing/dressing/bathroom;Assistance with cooking/housework;Assist for transportation;Help with stairs or ramp for entrance   Equipment Recommendations  None recommended by OT          Precautions / Restrictions Precautions Precautions: Fall Recall of Precautions/Restrictions: Intact Restrictions Weight Bearing Restrictions Per Provider Order: No       Mobility Bed Mobility Overal bed mobility: Needs Assistance Bed Mobility: Supine to Sit     Supine to sit: HOB elevated, Min assist     General bed mobility comments: Assist to pull to sit from supine with HOB elevated. Labored movement.    Transfers Overall transfer level: Needs assistance Equipment used: Rolling walker (2 wheels) Transfers: Sit to/from Stand, Bed to chair/wheelchair/BSC Sit to Stand: Contact guard assist, Min assist     Step pivot transfers: Contact guard assist     General transfer comment: Pt very lethargic. Labored effort and extended time to step to chair.     Balance Overall balance assessment: Needs assistance Sitting-balance support: No upper extremity supported, Feet supported Sitting balance-Leahy Scale: Fair Sitting balance - Comments: fair to good seated at EOB   Standing balance support: Bilateral upper extremity supported, During functional activity, Reliant on assistive device for balance Standing balance-Leahy Scale: Fair Standing balance comment: using RW  ADL either performed or assessed with clinical judgement   ADL Overall ADL's : Needs assistance/impaired                     Lower Body Dressing: Maximal assistance;Sitting/lateral leans Lower Body Dressing Details (indicate cue type and reason): Pt reported she was too weak to don  socks. Assisted while seated at EOB today.                     Communication Communication Communication: No apparent difficulties   Cognition Arousal: Lethargic Behavior During Therapy: WFL for tasks assessed/performed Cognition: No apparent impairments                               Following commands: Intact        Cueing   Cueing Techniques: Verbal cues, Tactile cues  Exercises Exercises: General Upper Extremity General Exercises - Upper Extremity Shoulder Flexion: AAROM, 10 reps, Both, Seated (x10 of A/ROM seated protraction.)                 Pertinent Vitals/ Pain       Pain Assessment Faces Pain Scale: Hurts a little bit Pain Location: general pain during bed mobility Pain Descriptors / Indicators: Grimacing Pain Intervention(s): Monitored during session, Repositioned                                                          Frequency  Min 2X/week        Progress Toward Goals  OT Goals(current goals can now be found in the care plan section)  Progress towards OT goals: Progressing toward goals  Acute Rehab OT Goals Patient Stated Goal: return home OT Goal Formulation: With patient Time For Goal Achievement: 05/08/24 Potential to Achieve Goals: Good ADL Goals Pt Will Perform Grooming: with modified independence;standing Pt Will Perform Lower Body Dressing: with modified independence;with adaptive equipment;sitting/lateral leans Pt Will Transfer to Toilet: with modified independence;ambulating Pt Will Perform Toileting - Clothing Manipulation and hygiene: with modified independence;sitting/lateral leans Pt/caregiver will Perform Home Exercise Program: Increased strength;Both right and left upper extremity;Independently  Plan                                      End of Session Equipment Utilized During Treatment: Rolling walker (2 wheels);Oxygen   OT Visit Diagnosis: Unsteadiness on feet  (R26.81);Other abnormalities of gait and mobility (R26.89);Muscle weakness (generalized) (M62.81)   Activity Tolerance Patient limited by lethargy   Patient Left in chair;with call bell/phone within reach   Nurse Communication Other (comment) (notified pt was in the chair)        Time: 1350-1407 OT Time Calculation (min): 17 min  Charges: OT General Charges $OT Visit: 1 Visit OT Treatments $Therapeutic Exercise: 8-22 mins  Sarabelle Genson OT, MOT  Jayson Person 04/28/2024, 2:26 PM

## 2024-04-29 ENCOUNTER — Inpatient Hospital Stay (HOSPITAL_COMMUNITY)

## 2024-04-29 DIAGNOSIS — N390 Urinary tract infection, site not specified: Secondary | ICD-10-CM | POA: Diagnosis not present

## 2024-04-29 DIAGNOSIS — M8000XA Age-related osteoporosis with current pathological fracture, unspecified site, initial encounter for fracture: Secondary | ICD-10-CM | POA: Diagnosis not present

## 2024-04-29 DIAGNOSIS — E782 Mixed hyperlipidemia: Secondary | ICD-10-CM | POA: Diagnosis not present

## 2024-04-29 DIAGNOSIS — I272 Pulmonary hypertension, unspecified: Secondary | ICD-10-CM | POA: Diagnosis not present

## 2024-04-29 LAB — MAGNESIUM: Magnesium: 2.3 mg/dL (ref 1.7–2.4)

## 2024-04-29 LAB — BRAIN NATRIURETIC PEPTIDE: B Natriuretic Peptide: 31 pg/mL (ref 0.0–100.0)

## 2024-04-29 LAB — GLUCOSE, CAPILLARY: Glucose-Capillary: 142 mg/dL — ABNORMAL HIGH (ref 70–99)

## 2024-04-29 MED ORDER — GUAIFENESIN-DM 100-10 MG/5ML PO SYRP
10.0000 mL | ORAL_SOLUTION | Freq: Three times a day (TID) | ORAL | Status: DC
  Administered 2024-04-29 – 2024-04-30 (×4): 10 mL via ORAL
  Filled 2024-04-29 (×7): qty 10

## 2024-04-29 MED ORDER — CHLORHEXIDINE GLUCONATE CLOTH 2 % EX PADS
6.0000 | MEDICATED_PAD | Freq: Every day | CUTANEOUS | Status: DC
  Administered 2024-04-30 – 2024-05-01 (×2): 6 via TOPICAL

## 2024-04-29 MED ORDER — POTASSIUM CHLORIDE CRYS ER 20 MEQ PO TBCR
40.0000 meq | EXTENDED_RELEASE_TABLET | Freq: Once | ORAL | Status: AC
  Administered 2024-04-29: 40 meq via ORAL
  Filled 2024-04-29: qty 2

## 2024-04-29 NOTE — Progress Notes (Signed)
 Tele has expired.  Notified MD. Md order to remove. Pt refuses to remove tele.

## 2024-04-29 NOTE — Progress Notes (Signed)
 Pt still has not urinated since foley removal at 1240 today.  Pt c/o that she cannot urinate.  Md notified.   Md orders to place urinary catheter again.  Foley placed per orders.  Output in bag around at present time.

## 2024-04-29 NOTE — Progress Notes (Signed)
 Progress Note   Patient: Kathleen [name] FMW:[id number] DOB: May 25, 1961 DOA: 04/23/2024     5 DOS: the patient was seen and examined on 04/29/2024   Brief hospital admission narrative: As per H&P written by Dr. Sherlon on 04/23/2024 Kathleen [name]  is a 63 y.o. female, past medical history of interstitial lung disease, sarcoidosis, chronic respiratory failure with hypoxia on 5 L of oxygen  morbid obesity Addison's disease, bilateral hip avascular necrosis, lumbar back pain and lumbar compression fracture, status post recent hospitalization for kyphoplasty by IR and CIR discharge 02/25/2024. - Was sent by her PCP, as she does appear to be with worsening urinary retention and lower extremity edema, patient with urinary retention at baseline, husband assisting her with self cath in and out permanently, but it does appear she has been requiring it more often, will she is with worsening lower extremity edema. - In ED she was noted to have +3 lower extremity edema, she had Foley catheter inserted with 600 cc urine output, her UA is positive for pyuria, leukocytes and bacteriuria she is with history of Pseudomonas UTI (urine culture June/19/2025), her labs were significant for baseline hemoglobin at 9.8, chest x-ray with no acute findings, BNP within normal limit, CT abdomen pelvis with interval development of moderate compression deformity of L1, subacute to old fracture, and superior endplate depression L3, L4, L5 vertebral bodies concerning for subacute to old fractures.  [facility] hospitalist consulted to admit. ======================================================================  Subjective:  Patient was seen and examined this morning, stating she is not feeling too well, denies any chest pain, shortness of breath reports of congestion, nonproductive cough Denies having any fever or chills nausea vomiting Reports improved abdominal  discomfort   --------------------------------------------------------------------------------------------------------------------------  Assessment and plan   UTI/urinary retention - Foley catheter has been placed -still in place, dark urine noted - At home patient chronically requiring In-N-Out catheterization - Patient will complete IV antibiotics after today's dosages. -Continue to maintain adequate hydration - Outpatient follow-up with urology service will be recommended  to have further evaluation of her bladder dysmotility. - Continue to follow clinical response. - Patient is reluctant to discontinue Foley catheter today voiding trial in a.m. plan  Lower extremity edema - Venous Dopplers has rule out the presence of DVT - Will transition to oral diuretics and follow urine output response. - Echo demonstrating grade 1 diastolic dysfunction, positive increased pulmonary pressure and no significant valvular disorder.  Suboptimal images to rule out the presence of wall motion abnormalities. - Continue low-sodium diet and adequate protein intake discussed with patient.  Weakness/deconditioning - Appreciate assistance and recommendation by PT/OT - home health PT at discharge will be arranged   Sarcoidosis/interstitial lung disease/chronic hypoxic respiratory failure Complaining of cough and congestion this morning, chest x-ray  - On chronic oxygen  at baseline (4 to 5 L); stable - Continue the use of incentive spirometer/flutter valve - No acute findings appreciated on previous chest x-ray x-ray - Continue as needed mucolytic's.  Vitamin D  deficiency/history of chronic lumbar compression fractures - Continue calcium  and vitamin D  supplementation - Continue outpatient Fosamax  - Patient with history of lumbar compression fractures; continue as needed analgesics.  Morbid obesity -Body mass index is 36.38 kg/m.  -Low-calorie diet and portion control discussed with  patient.  History of Addison's disease/chronic hypotension - Continue hydrocortisone  and midodrine . - Follow vital signs.  Hyperlipidemia - Continue statin.  Hypothyroidism - Continue Synthroid .  Anxiety/depression - Resume home BuSpar , Atarax  and as needed low-dose Ativan   Chronic diastolic heart failure -  2D echo results demonstrating ejection fraction 60 to 65%; grade 1 diastolic dysfunction and without significant valvular disorder.  Elevated pulmonary pressures appreciated. - Patient with signs of increased lower extremity swelling most likely in the setting of hypoalbuminemia and venous insufficiency - BNP within normal limits and no vascular congestion appreciated on x-ray - Continue to follow daily weights/strict I's and O's.  Intake/Output Summary (Last 24 hours) at 04/29/2024 1244 Last data filed at 04/29/2024 0940 Gross per 24 hour  Intake 911.18 ml  Output 700 ml  Net 211.18 ml     History of left femoral head avascular necrosis/chronic pain - Patient is not a surgical candidate per orthopedic service - This is secondary to chronic use of steroids in the past - Continue supportive care. - Continue analgesics  Constipation/abdominal pain/intermittent nausea - CT scan demonstrating ileus versus partial SBO - Patient reports last bowel movement on Friday - Increase physical activity, treatment with Linzess , adjusted dose of MiraLAX  and Colace and continue use of PPI and Pepcid  will be provided - Continue as needed antiemetics      Physical Exam: Vitals:   04/28/24 1926 04/29/24 0406 04/29/24 0705 04/29/24 1058  BP: (!) 121/51 (!) 113/52  (!) 125/56  Pulse: 99 96  94  Resp: 20 18  20   Temp: 98.1 F (36.7 C) (!) 97.5 F (36.4 C)  98 F (36.7 C)  TempSrc: Oral Oral  Oral  SpO2: 92% 94% 97% 96%  Weight:  115 kg    Height:       General:  AAO x 3,  cooperative, no distress;   HEENT:  Normocephalic, PERRL, otherwise with in Normal limits   Neuro:   CNII-XII intact. , normal motor and sensation, reflexes intact   Lungs:   Clear to auscultation BL, Respirations unlabored,  No wheezes / crackles  Cardio:    S1/S2, RRR, No murmure, No Rubs or Gallops   Abdomen:  Soft, non-tender, bowel sounds active all four quadrants, no guarding or peritoneal signs.  Muscular  skeletal:  Limited exam -global generalized weaknesses - in bed, able to move all 4 extremities,   2+ pulses,  symmetric, No pitting edema  Skin:  Dry, warm to touch, negative for any Rashes,  Wounds: Please see nursing documentation         Latest data Reviewed:    Latest Ref Rng & Units 04/24/2024    3:22 AM 04/23/2024   12:36 PM 02/24/2024    5:20 AM  CBC  WBC 4.0 - 10.5 K/uL 7.2  8.5  7.5   Hemoglobin 12.0 - 15.0 g/dL 8.0  9.8  8.7   Hematocrit 36.0 - 46.0 % 27.6  34.8  30.8   Platelets 150 - 400 K/uL 438  248  331       Latest Ref Rng & Units 04/28/2024    7:43 AM 04/26/2024    5:04 AM 04/24/2024    3:22 AM  CMP  Glucose 70 - 99 mg/dL 873  870  875   BUN 8 - 23 mg/dL 9  10  8    Creatinine 0.44 - 1.00 mg/dL 9.09  9.13  9.17   Sodium 135 - 145 mmol/L 136  140  142   Potassium 3.5 - 5.1 mmol/L 3.2  3.7  3.9   Chloride 98 - 111 mmol/L 91  95  98   CO2 22 - 32 mmol/L 31  34  33   Calcium  8.9 - 10.3 mg/dL 9.0  9.0  8.6       Family Communication: Husband at bedside.  Disposition: Status is: Inpatient Remains inpatient appropriate because: Continue IV therapy.  Anticipating discharge back home with home health services once medically stable. Anticipating discharge with home health in 24 hours   Time spent: 50 minutes  Author: Adriana DELENA Grams, MD 04/29/2024 12:37 PM  For on call review [email or web address].

## 2024-04-29 NOTE — Progress Notes (Signed)
 Pt has expired tele orders. Refuses to remove. Pt stated her heart stopped the last time she was admitted to [facility], therefore she wants the tele to remain in place. MD notified.

## 2024-04-30 ENCOUNTER — Inpatient Hospital Stay (HOSPITAL_COMMUNITY)

## 2024-04-30 ENCOUNTER — Other Ambulatory Visit (HOSPITAL_COMMUNITY): Payer: Self-pay

## 2024-04-30 DIAGNOSIS — J90 Pleural effusion, not elsewhere classified: Secondary | ICD-10-CM

## 2024-04-30 DIAGNOSIS — J9600 Acute respiratory failure, unspecified whether with hypoxia or hypercapnia: Secondary | ICD-10-CM | POA: Diagnosis not present

## 2024-04-30 LAB — COMPREHENSIVE METABOLIC PANEL WITH GFR
ALT: 14 U/L (ref 0–44)
AST: 20 U/L (ref 15–41)
Albumin: 2.6 g/dL — ABNORMAL LOW (ref 3.5–5.0)
Alkaline Phosphatase: 60 U/L (ref 38–126)
Anion gap: 12 (ref 5–15)
BUN: 8 mg/dL (ref 8–23)
CO2: 29 mmol/L (ref 22–32)
Calcium: 9 mg/dL (ref 8.9–10.3)
Chloride: 95 mmol/L — ABNORMAL LOW (ref 98–111)
Creatinine, Ser: 0.7 mg/dL (ref 0.44–1.00)
GFR, Estimated: >60 mL/min (ref >60)
Glucose, Bld: 127 mg/dL — ABNORMAL HIGH (ref 70–99)
Potassium: 3.2 mmol/L — ABNORMAL LOW (ref 3.5–5.1)
Sodium: 136 mmol/L (ref 135–145)
Total Bilirubin: 0.5 mg/dL (ref 0.0–1.2)
Total Protein: 5.7 g/dL — ABNORMAL LOW (ref 6.5–8.1)

## 2024-04-30 LAB — CBC WITH DIFFERENTIAL/PLATELET
Abs Immature Granulocytes: 0.21 K/uL — ABNORMAL HIGH (ref 0.00–0.07)
Basophils Absolute: 0.1 K/uL (ref 0.0–0.1)
Basophils Relative: 1 %
Eosinophils Absolute: 0.4 K/uL (ref 0.0–0.5)
Eosinophils Relative: 4 %
HCT: 29.8 % — ABNORMAL LOW (ref 36.0–46.0)
Hemoglobin: 8.9 g/dL — ABNORMAL LOW (ref 12.0–15.0)
Immature Granulocytes: 2 %
Lymphocytes Relative: 12 %
Lymphs Abs: 1.1 K/uL (ref 0.7–4.0)
MCH: 27.3 pg (ref 26.0–34.0)
MCHC: 29.9 g/dL — ABNORMAL LOW (ref 30.0–36.0)
MCV: 91.4 fL (ref 80.0–100.0)
Monocytes Absolute: 0.7 K/uL (ref 0.1–1.0)
Monocytes Relative: 8 %
Neutro Abs: 6.6 K/uL (ref 1.7–7.7)
Neutrophils Relative %: 73 %
Platelets: 445 K/uL — ABNORMAL HIGH (ref 150–400)
RBC: 3.26 MIL/uL — ABNORMAL LOW (ref 3.87–5.11)
RDW: 17 % — ABNORMAL HIGH (ref 11.5–15.5)
WBC: 9.1 K/uL (ref 4.0–10.5)
nRBC: 0 % (ref 0.0–0.2)

## 2024-04-30 MED ORDER — ENOXAPARIN SODIUM 40 MG/0.4ML IJ SOSY
40.0000 mg | PREFILLED_SYRINGE | INTRAMUSCULAR | Status: DC
  Administered 2024-05-01: 40 mg via SUBCUTANEOUS
  Filled 2024-04-30: qty 0.4

## 2024-04-30 NOTE — Progress Notes (Signed)
 Physical Therapy Treatment Patient Details Name: Kathleen [name] MRN: [id number] DOB: 02/18/1961 Today's Date: 04/30/2024   History of Present Illness Kathleen [name]  is a 63 y.o. female, past medical history of interstitial lung disease, sarcoidosis, chronic respiratory failure with hypoxia on 5 L of oxygen  morbid obesity Addison's disease, bilateral hip avascular necrosis, lumbar back pain and lumbar compression fracture, status post recent hospitalization for kyphoplasty by IR and CIR discharge 02/25/2024.  - Was sent by her PCP, as she does appear to be with worsening urinary retention and lower extremity edema, patient with urinary retention at baseline, husband assisting her with self cath in and out permanently, but it does appear she has been requiring it more often, will she is with worsening lower extremity edema.  - In ED she was noted to have +3 lower extremity edema, she had Foley catheter inserted with 600 cc urine output, her UA is positive for pyuria, leukocytes and bacteriuria she is with history of Pseudomonas UTI (urine culture June/19/2025), her labs were significant for baseline hemoglobin at 9.8, chest x-ray with no acute findings, BNP within normal limit, CT abdomen pelvis with interval development of moderate compression deformity of L1, subacute to old fracture, and superior endplate depression L3, L4, L5 vertebral bodies concerning for subacute to old fractures.  [facility] hospitalist consulted to admit.    PT Comments  Patient agreeable for therapy and c/o chronic low back pain. Patient demonstrates fair/good return for completing BLE ROM/strengthening exercises while seated at bedside and tolerated ambulating to doorway and back before requesting to sit due to c/o fatigue. Patient Spo2 at 93% while on 4 LPM after ambulation. Patient requested to go back to bed due to fatigue. Patient will benefit from continued skilled physical therapy in hospital and recommended venue below to increase  strength, balance, endurance for safe ADLs and gait.      If plan is discharge home, recommend the following: A little help with walking and/or transfers;Assistance with cooking/housework;A little help with bathing/dressing/bathroom;Assist for transportation;Help with stairs or ramp for entrance   Can travel by private vehicle        Equipment Recommendations  None recommended by PT    Recommendations for Other Services       Precautions / Restrictions Precautions Precautions: Fall Recall of Precautions/Restrictions: Intact Restrictions Weight Bearing Restrictions Per Provider Order: No     Mobility  Bed Mobility Overal bed mobility: Needs Assistance Bed Mobility: Rolling, Sidelying to Sit, Sit to Sidelying Rolling: Contact guard assist Sidelying to sit: Min assist, HOB elevated     Sit to sidelying: Contact guard assist, Min assist General bed mobility comments: increased time, labored movement, has diffiuclty moving legs on/off bed    Transfers Overall transfer level: Needs assistance Equipment used: Rolling walker (2 wheels) Transfers: Sit to/from Stand, Bed to chair/wheelchair/BSC Sit to Stand: Contact guard assist   Step pivot transfers: Contact guard assist       General transfer comment: slightly labored movement, increased time    Ambulation/Gait Ambulation/Gait assistance: Contact guard assist Gait Distance (Feet): 23 Feet Assistive device: Rolling walker (2 wheels) Gait Pattern/deviations: Decreased step length - right, Decreased step length - left, Decreased stride length Gait velocity: Dec     General Gait Details: labored movement without loss of balance using RW, limited mostly due to c/o fatigue, on 4 LPM   Stairs             Wheelchair Mobility     Tilt Bed  Modified Rankin (Stroke Patients Only)       Balance Overall balance assessment: Needs assistance Sitting-balance support: Feet supported, No upper extremity  supported Sitting balance-Leahy Scale: Fair Sitting balance - Comments: fair/good seated at EOB   Standing balance support: Reliant on assistive device for balance, Bilateral upper extremity supported, During functional activity Standing balance-Leahy Scale: Fair Standing balance comment: using RW                            Communication Communication Communication: No apparent difficulties  Cognition Arousal: Alert Behavior During Therapy: WFL for tasks assessed/performed, Anxious   PT - Cognitive impairments: No apparent impairments                         Following commands: Intact      Cueing Cueing Techniques: Verbal cues, Tactile cues  Exercises General Exercises - Lower Extremity Long Arc Quad: Seated, AROM, Strengthening, Both, 10 reps Hip Flexion/Marching: Seated, AROM, Strengthening, Both, 10 reps Toe Raises: Seated, AROM, Strengthening, Both, 10 reps Heel Raises: Seated, AROM, Strengthening, Both, 10 reps    General Comments        Pertinent Vitals/Pain Pain Assessment Pain Assessment: Faces Faces Pain Scale: Hurts even more Pain Location: back pain Pain Descriptors / Indicators: Grimacing, Guarding Pain Intervention(s): Limited activity within patient's tolerance, Monitored during session, Repositioned    Home Living                          Prior Function            PT Goals (current goals can now be found in the care plan section) Acute Rehab PT Goals Patient Stated Goal: Return home PT Goal Formulation: With patient/family Time For Goal Achievement: 05/04/24 Potential to Achieve Goals: Good Progress towards PT goals: Progressing toward goals    Frequency    Min 3X/week      PT Plan      Co-evaluation              AM-PAC PT 6 Clicks Mobility   Outcome Measure  Help needed turning from your back to your side while in a flat bed without using bedrails?: None Help needed moving from lying on your  back to sitting on the side of a flat bed without using bedrails?: A Little Help needed moving to and from a bed to a chair (including a wheelchair)?: A Little Help needed standing up from a chair using your arms (e.g., wheelchair or bedside chair)?: A Little Help needed to walk in hospital room?: A Little Help needed climbing 3-5 steps with a railing? : A Lot 6 Click Score: 18    End of Session Equipment Utilized During Treatment: Gait belt;Oxygen  Activity Tolerance: Patient tolerated treatment well;Patient limited by fatigue Patient left: in bed;with call bell/phone within reach;with family/visitor present Nurse Communication: Mobility status PT Visit Diagnosis: Unsteadiness on feet (R26.81);Other abnormalities of gait and mobility (R26.89);Muscle weakness (generalized) (M62.81)     Time: 8582-8561 PT Time Calculation (min) (ACUTE ONLY): 21 min  Charges:    $Gait Training: 8-22 mins $Therapeutic Exercise: 8-22 mins PT General Charges $$ ACUTE PT VISIT: 1 Visit                     3:33 PM, 04/30/24 Lynwood Music, MPT Physical Therapist with [facility] [phone] office [phone] mobile phone

## 2024-04-30 NOTE — Progress Notes (Signed)
 Occupational Therapy Treatment Patient Details Name: Kathleen [name] MRN: [id number] DOB: 12/01/1960 Today's Date: 04/30/2024   History of present illness Kathleen [name]  is a 63 y.o. female, past medical history of interstitial lung disease, sarcoidosis, chronic respiratory failure with hypoxia on 5 L of oxygen  morbid obesity Addison's disease, bilateral hip avascular necrosis, lumbar back pain and lumbar compression fracture, status post recent hospitalization for kyphoplasty by IR and CIR discharge 02/25/2024.  - Was sent by her PCP, as she does appear to be with worsening urinary retention and lower extremity edema, patient with urinary retention at baseline, husband assisting her with self cath in and out permanently, but it does appear she has been requiring it more often, will she is with worsening lower extremity edema.  - In ED she was noted to have +3 lower extremity edema, she had Foley catheter inserted with 600 cc urine output, her UA is positive for pyuria, leukocytes and bacteriuria she is with history of Pseudomonas UTI (urine culture June/19/2025), her labs were significant for baseline hemoglobin at 9.8, chest x-ray with no acute findings, BNP within normal limit, CT abdomen pelvis with interval development of moderate compression deformity of L1, subacute to old fracture, and superior endplate depression L3, L4, L5 vertebral bodies concerning for subacute to old fractures.  [facility] hospitalist consulted to admit.   OT comments  Pt agreeable to OT treatment after discussing status with nursing. Pt required increased time and decreased endurance for mobility today. Pt noted to be short of breath after a brief ambulation to the sink to attempt grooming. Pt stood only for 10 to 20 seconds to attempt washing hands before stating she does not do well standing and wanted to leave the sink. Pt able to tolerate B UE strengthening better than last attempt, but pt does report R shoulder pain at upper ranges  and required assist to reach near full shoulder flexion range bilaterally. Pt given HEP for A/ROM to continue on her own. Pt left in the bed with family present. Pt will benefit from continued OT in the hospital and recommended venue below to increase strength, balance, and endurance for safe ADL's.         If plan is discharge home, recommend the following:  A little help with walking and/or transfers;A lot of help with bathing/dressing/bathroom;Assistance with cooking/housework;Assist for transportation;Help with stairs or ramp for entrance   Equipment Recommendations  None recommended by OT    Recommendations for Other Services      Precautions / Restrictions Precautions Precautions: Fall Recall of Precautions/Restrictions: Intact Restrictions Weight Bearing Restrictions Per Provider Order: No       Mobility Bed Mobility Overal bed mobility: Needs Assistance Bed Mobility: Supine to Sit     Supine to sit: HOB elevated, Contact guard     General bed mobility comments: Extended time and labored effort with pt raising HOB higher to assist with sitting upright.    Transfers Overall transfer level: Needs assistance Equipment used: Rolling walker (2 wheels) Transfers: Sit to/from Stand Sit to Stand: Contact guard assist, From elevated surface           General transfer comment: Pt requested to elevate the bed surface and reported her bed at home is raised. ~2 attempts at sit to stand before pt achieved success.     Balance   Sitting-balance support: No upper extremity supported, Feet supported Sitting balance-Leahy Scale: Fair Sitting balance - Comments: seated at EOB   Standing balance support: Bilateral upper  extremity supported, During functional activity, Reliant on assistive device for balance Standing balance-Leahy Scale: Fair Standing balance comment: poor to fair with RW and leaning on sink. Pt struggled with static balance to attempt grooming.                            ADL either performed or assessed with clinical judgement   ADL Overall ADL's : Needs assistance/impaired     Grooming: Contact guard assist;Standing;Wash/dry hands;Minimal assistance Grooming Details (indicate cue type and reason): Pt able to partially wash her hands while standing with RW available. Pt reports she does not statically stand well. Pt limited due to difficulty in standing. Washing of hands was not very thorough due to this limitation.             Lower Body Dressing: Maximal assistance;Sitting/lateral leans Lower Body Dressing Details (indicate cue type and reason): Pt able to reach upper portion of shins from seated at EOB position. Reported she could not put on her socks. Pt assisted at baseline.             Functional mobility during ADLs: Contact guard assist;Rolling walker (2 wheels) General ADL Comments: Able to ambualte to the sink and back to bed but appeared short of breath and reported fatigue.    Extremity/Trunk Assessment Upper Extremity Assessment Upper Extremity Assessment: RUE deficits/detail RUE Deficits / Details: Pt reports having pain in her R shoulder today. States that she has an ongoing injury that she needs an operation for. Assist to day to reach near full shoulder flexion.                             Communication Communication Communication: No apparent difficulties   Cognition Arousal: Alert Behavior During Therapy: WFL for tasks assessed/performed Cognition: No apparent impairments                               Following commands: Intact        Cueing   Cueing Techniques: Verbal cues, Tactile cues  Exercises Exercises: General Upper Extremity General Exercises - Upper Extremity Shoulder Flexion: AAROM, 10 reps, Both, Seated (x10 reps protraction as well.) Shoulder ABduction: AROM, Both, 10 reps, Seated Shoulder Horizontal ABduction: AROM, Both, 10 reps, Seated (x10 ER as well  while seated.)                 Pertinent Vitals/ Pain       Pain Assessment Pain Assessment: Faces Faces Pain Scale: Hurts little more Pain Location: back pain Pain Descriptors / Indicators: Grimacing, Guarding Pain Intervention(s): Monitored during session, Limited activity within patient's tolerance                                                          Frequency  Min 2X/week        Progress Toward Goals  OT Goals(current goals can now be found in the care plan section)  Progress towards OT goals: Progressing toward goals  Acute Rehab OT Goals Patient Stated Goal: return home OT Goal Formulation: With patient Time For Goal Achievement: 05/08/24 Potential to Achieve Goals: Good ADL Goals Pt Will Perform Grooming: with  modified independence;standing Pt Will Perform Lower Body Dressing: with modified independence;with adaptive equipment;sitting/lateral leans Pt Will Transfer to Toilet: with modified independence;ambulating Pt Will Perform Toileting - Clothing Manipulation and hygiene: with modified independence;sitting/lateral leans Pt/caregiver will Perform Home Exercise Program: Increased strength;Both right and left upper extremity;Independently  Plan                                      End of Session Equipment Utilized During Treatment: Gait belt;Rolling walker (2 wheels);Oxygen   OT Visit Diagnosis: Unsteadiness on feet (R26.81);Other abnormalities of gait and mobility (R26.89);Muscle weakness (generalized) (M62.81)   Activity Tolerance Patient tolerated treatment well   Patient Left in bed;with call bell/phone within reach   Nurse Communication          Time: 1348-1410 OT Time Calculation (min): 22 min  Charges: OT General Charges $OT Visit: 1 Visit OT Treatments $Therapeutic Exercise: 8-22 mins  Alyshia Kernan OT, MOT  Jayson Person 04/30/2024, 3:03 PM

## 2024-04-30 NOTE — Patient Instructions (Signed)

## 2024-04-30 NOTE — Plan of Care (Signed)
  Problem: Clinical Measurements: Goal: Will remain free from infection Outcome: Progressing Goal: Respiratory complications will improve Outcome: Progressing   Problem: Activity: Goal: Risk for activity intolerance will decrease Outcome: Progressing   Problem: Coping: Goal: Level of anxiety will decrease Outcome: Progressing   Problem: Pain Managment: Goal: General experience of comfort will improve and/or be controlled Outcome: Progressing   Problem: Skin Integrity: Goal: Risk for impaired skin integrity will decrease Outcome: Progressing

## 2024-04-30 NOTE — Plan of Care (Signed)

## 2024-04-30 NOTE — Progress Notes (Signed)
 Progress Note   Patient: Kathleen [name] FMW:[id number] DOB: February 17, 1961 DOA: 04/23/2024     6 DOS: the patient was seen and examined on 04/30/2024   Brief hospital admission narrative: As per H&P written by Dr. Sherlon on 04/23/2024 Zona Pedro  is a 63 y.o. female, past medical history of interstitial lung disease, sarcoidosis, chronic respiratory failure with hypoxia on 5 L of oxygen  morbid obesity Addison's disease, bilateral hip avascular necrosis, lumbar back pain and lumbar compression fracture, status post recent hospitalization for kyphoplasty by IR and CIR discharge 02/25/2024. - Was sent by her PCP, as she does appear to be with worsening urinary retention and lower extremity edema, patient with urinary retention at baseline, husband assisting her with self cath in and out permanently, but it does appear she has been requiring it more often, will she is with worsening lower extremity edema. - In ED she was noted to have +3 lower extremity edema, she had Foley catheter inserted with 600 cc urine output, her UA is positive for pyuria, leukocytes and bacteriuria she is with history of Pseudomonas UTI (urine culture June/19/2025), her labs were significant for baseline hemoglobin at 9.8, chest x-ray with no acute findings, BNP within normal limit, CT abdomen pelvis with interval development of moderate compression deformity of L1, subacute to old fracture, and superior endplate depression L3, L4, L5 vertebral bodies concerning for subacute to old fractures.  [facility] hospitalist consulted to admit. ======================================================================  Subjective:  The patient was seen and examined this morning, stable no acute distress stating she is feeling better today On 4 L of oxygen , satting 93%, afebrile normotensive   --------------------------------------------------------------------------------------------------------------------------  Assessment and  plan   UTI/urinary retention - Foley catheter was placed on admission, failed voiding trial yesterday 04/29/2024 Foley catheter was placed again --- discussed with urologist Dr. Selma -Who recommended continue Flomax  and Foley catheter and follow-up as an outpatient with Dr. Sherrilee (There is no urgency or emergency on this case therefore can follow-up as an outpatient-will not be able to see the patient as an inpatient at this point)   - At home patient chronically requiring In-N-Out catheterization - Patient will complete IV antibiotics 04/29/24  -Continue to maintain adequate hydration   Left pleural effusion  - On Lasix  -with minimal response - Requiring 4 L of oxygen , satting 93% - Chest x-ray consistent with left pleural effusion-moderate  - Ordered to obtain ultrasound-guided left-sided thoracentesis With fluid analysis    lower extremity edema - Venous Dopplers has rule out the presence of DVT - Will transition to oral diuretics and follow urine output response. - Echo demonstrating grade 1 diastolic dysfunction, positive increased pulmonary pressure and no significant valvular disorder.  Suboptimal images to rule out the presence of wall motion abnormalities. - Continue low-sodium diet and adequate protein intake discussed with patient.  Weakness/deconditioning - Appreciate assistance and recommendation by PT/OT - home health PT at discharge will be arranged   Sarcoidosis/interstitial lung disease/chronic hypoxic respiratory failure Complaining of cough and congestion this morning, chest x-ray  - On chronic oxygen  at baseline (4 to 5 L); stable - Continue the use of incentive spirometer/flutter valve - Continue as needed mucolytic's.  Vitamin D  deficiency/history of chronic lumbar compression fractures - Continue calcium  and vitamin D  supplementation - Continue outpatient Fosamax  - Patient with history of lumbar compression fractures; continue as needed  analgesics.  Morbid obesity -Body mass index is 35.08 kg/m.  -Low-calorie diet and portion control discussed with patient.  History of Addison's disease/chronic  hypotension - Continue hydrocortisone  and midodrine . - Follow vital signs.  Hyperlipidemia - Continue statin.  Hypothyroidism - Continue Synthroid .  Hypokalemia -repleting orally  anxiety/depression - Resume home BuSpar , Atarax  and as needed low-dose Ativan   Chronic diastolic heart failure - 2D echo results demonstrating ejection fraction 60 to 65%; grade 1 diastolic dysfunction and without significant valvular disorder.  Elevated pulmonary pressures appreciated. - Patient with signs of increased lower extremity swelling most likely in the setting of hypoalbuminemia and venous insufficiency - BNP within normal limits and no vascular congestion appreciated on x-ray - Continue to follow daily weights/strict I's and O's.  Intake/Output Summary (Last 24 hours) at 04/30/2024 1128 Last data filed at 04/30/2024 1100 Gross per 24 hour  Intake 720 ml  Output 1000 ml  Net -280 ml     History of left femoral head avascular necrosis/chronic pain - Patient is not a surgical candidate per orthopedic service - This is secondary to chronic use of steroids in the past - Continue supportive care. - Continue analgesics  Anemia of chronic disease  - Monitoring H&H-stable at baseline  Morbid obesity  Body mass index is 35.08 kg/m. - Patient is on steroids, discussed close follow-up with the PCP regarding weight loss programs and regimens   constipation/abdominal pain/intermittent nausea - CT scan demonstrating ileus versus partial SBO - Patient reports last bowel movement on Friday - Increase physical activity, treatment with Linzess , adjusted dose of MiraLAX  and Colace and continue use of PPI and Pepcid  will be provided - Continue as needed antiemetics      Physical Exam: Vitals:   04/29/24 1408 04/29/24 1934 04/30/24  0500 04/30/24 0735  BP: (!) 109/53 (!) 126/54 (!) 125/56   Pulse: 95 87 95   Resp: 20 18 20    Temp: 98.3 F (36.8 C) 97.7 F (36.5 C) 97.9 F (36.6 C)   TempSrc: Oral Oral Oral   SpO2: 96% 100% 100% 93%  Weight:   110.9 kg   Height:            General:  AAO x 3,  cooperative, no distress;   HEENT:  Normocephalic, PERRL, otherwise with in Normal limits   Neuro:  CNII-XII intact. , normal motor and sensation, reflexes intact   Lungs:   Clear to auscultation BL, Respirations unlabored,  No wheezes /mild crackles in left lower lobe  Cardio:    S1/S2, RRR, No murmure, No Rubs or Gallops   Abdomen:  Soft, non-tender, bowel sounds active all four quadrants, no guarding or peritoneal signs.  Muscular  skeletal:  Limited exam -global generalized weaknesses - in bed, able to move all 4 extremities,   2+ pulses,  symmetric, No pitting edema  Skin:  Dry, warm to touch, negative for any Rashes,  Foley catheter in place  wounds: None visible on exam, please see nurses documentation    Latest data Reviewed:    Latest Ref Rng & Units 04/30/2024    7:47 AM 04/24/2024    3:22 AM 04/23/2024   12:36 PM  CBC  WBC 4.0 - 10.5 K/uL 9.1  7.2  8.5   Hemoglobin 12.0 - 15.0 g/dL 8.9  8.0  9.8   Hematocrit 36.0 - 46.0 % 29.8  27.6  34.8   Platelets 150 - 400 K/uL 445  438  248       Latest Ref Rng & Units 04/30/2024    7:47 AM 04/28/2024    7:43 AM 04/26/2024    5:04 AM  CMP  Glucose 70 - 99 mg/dL 872  873  870   BUN 8 - 23 mg/dL 8  9  10    Creatinine 0.44 - 1.00 mg/dL 9.29  9.09  9.13   Sodium 135 - 145 mmol/L 136  136  140   Potassium 3.5 - 5.1 mmol/L 3.2  3.2  3.7   Chloride 98 - 111 mmol/L 95  91  95   CO2 22 - 32 mmol/L 29  31  34   Calcium  8.9 - 10.3 mg/dL 9.0  9.0  9.0   Total Protein 6.5 - 8.1 g/dL 5.7     Total Bilirubin 0.0 - 1.2 mg/dL 0.5     Alkaline Phos 38 - 126 U/L 60     AST 15 - 41 U/L 20     ALT 0 - 44 U/L 14         Family Communication: Husband at  bedside.  Disposition: Status is: Inpatient Remains inpatient appropriate because: Continue IV therapy.  Anticipating discharge back home with home health services once medically stable. Anticipating discharge with home health in 24 hours   Time spent: 50 minutes  Author: Adriana DELENA Grams, MD 04/30/2024 11:28 AM  For on call review [email or web address].

## 2024-05-01 ENCOUNTER — Encounter (HOSPITAL_COMMUNITY): Payer: Self-pay | Admitting: Internal Medicine

## 2024-05-01 ENCOUNTER — Other Ambulatory Visit: Payer: Self-pay

## 2024-05-01 ENCOUNTER — Other Ambulatory Visit (HOSPITAL_COMMUNITY): Payer: Self-pay

## 2024-05-01 ENCOUNTER — Inpatient Hospital Stay (HOSPITAL_COMMUNITY)

## 2024-05-01 DIAGNOSIS — A419 Sepsis, unspecified organism: Secondary | ICD-10-CM | POA: Diagnosis not present

## 2024-05-01 DIAGNOSIS — N390 Urinary tract infection, site not specified: Secondary | ICD-10-CM | POA: Diagnosis not present

## 2024-05-01 DIAGNOSIS — J811 Chronic pulmonary edema: Secondary | ICD-10-CM | POA: Diagnosis not present

## 2024-05-01 DIAGNOSIS — J90 Pleural effusion, not elsewhere classified: Secondary | ICD-10-CM | POA: Diagnosis not present

## 2024-05-01 DIAGNOSIS — R918 Other nonspecific abnormal finding of lung field: Secondary | ICD-10-CM | POA: Diagnosis not present

## 2024-05-01 DIAGNOSIS — J9621 Acute and chronic respiratory failure with hypoxia: Secondary | ICD-10-CM

## 2024-05-01 LAB — CBC WITH DIFFERENTIAL/PLATELET
Abs Immature Granulocytes: 0.28 K/uL — ABNORMAL HIGH (ref 0.00–0.07)
Basophils Absolute: 0.1 K/uL (ref 0.0–0.1)
Basophils Relative: 1 %
Eosinophils Absolute: 0.4 K/uL (ref 0.0–0.5)
Eosinophils Relative: 5 %
HCT: 31 % — ABNORMAL LOW (ref 36.0–46.0)
Hemoglobin: 9.2 g/dL — ABNORMAL LOW (ref 12.0–15.0)
Immature Granulocytes: 3 %
Lymphocytes Relative: 12 %
Lymphs Abs: 1.1 K/uL (ref 0.7–4.0)
MCH: 27 pg (ref 26.0–34.0)
MCHC: 29.7 g/dL — ABNORMAL LOW (ref 30.0–36.0)
MCV: 90.9 fL (ref 80.0–100.0)
Monocytes Absolute: 0.8 K/uL (ref 0.1–1.0)
Monocytes Relative: 9 %
Neutro Abs: 6.6 K/uL (ref 1.7–7.7)
Neutrophils Relative %: 70 %
Platelets: 465 K/uL — ABNORMAL HIGH (ref 150–400)
RBC: 3.41 MIL/uL — ABNORMAL LOW (ref 3.87–5.11)
RDW: 16.8 % — ABNORMAL HIGH (ref 11.5–15.5)
WBC: 9.2 K/uL (ref 4.0–10.5)
nRBC: 0 % (ref 0.0–0.2)

## 2024-05-01 LAB — URINALYSIS, ROUTINE W REFLEX MICROSCOPIC
Bacteria, UA: NONE SEEN
Bilirubin Urine: NEGATIVE
Glucose, UA: NEGATIVE mg/dL
Ketones, ur: NEGATIVE mg/dL
Leukocytes,Ua: NEGATIVE
Nitrite: NEGATIVE
Protein, ur: NEGATIVE mg/dL
Specific Gravity, Urine: 1.002 — ABNORMAL LOW (ref 1.005–1.030)
pH: 7 (ref 5.0–8.0)

## 2024-05-01 LAB — COMPREHENSIVE METABOLIC PANEL WITH GFR
ALT: 14 U/L (ref 0–44)
AST: 19 U/L (ref 15–41)
Albumin: 2.8 g/dL — ABNORMAL LOW (ref 3.5–5.0)
Alkaline Phosphatase: 59 U/L (ref 38–126)
Anion gap: 10 (ref 5–15)
BUN: 9 mg/dL (ref 8–23)
CO2: 29 mmol/L (ref 22–32)
Calcium: 9.3 mg/dL (ref 8.9–10.3)
Chloride: 101 mmol/L (ref 98–111)
Creatinine, Ser: 0.77 mg/dL (ref 0.44–1.00)
GFR, Estimated: >60 mL/min (ref >60)
Glucose, Bld: 127 mg/dL — ABNORMAL HIGH (ref 70–99)
Potassium: 3.4 mmol/L — ABNORMAL LOW (ref 3.5–5.1)
Sodium: 140 mmol/L (ref 135–145)
Total Bilirubin: 0.3 mg/dL (ref 0.0–1.2)
Total Protein: 5.8 g/dL — ABNORMAL LOW (ref 6.5–8.1)

## 2024-05-01 MED ORDER — PHENAZOPYRIDINE HCL 200 MG PO TABS
200.0000 mg | ORAL_TABLET | Freq: Three times a day (TID) | ORAL | 0 refills | Status: AC | PRN
  Filled 2024-05-01: qty 15, 5d supply, fill #0

## 2024-05-01 MED ORDER — LIDOCAINE HCL (PF) 2 % IJ SOLN
INTRAMUSCULAR | Status: AC
  Filled 2024-05-01: qty 10

## 2024-05-01 MED ORDER — LORAZEPAM 2 MG/ML IJ SOLN: 0.5000 mg | Freq: Once | INTRAMUSCULAR | Status: DC

## 2024-05-01 MED ORDER — FUROSEMIDE 20 MG PO TABS
20.0000 mg | ORAL_TABLET | Freq: Every day | ORAL | 0 refills | Status: DC
  Filled 2024-05-01: qty 30, 30d supply, fill #0

## 2024-05-01 NOTE — Plan of Care (Signed)

## 2024-05-01 NOTE — TOC Transition Note (Signed)
 Transition of Care [facility]) - Discharge Note   Patient Details  Name: Kathleen [name] MRN: [id number] Date of Birth: 28-Jan-1961  [facility] of [facility] CM/SW Contact:  Sharlyne Stabs, RN Phone Number: 05/01/2024, 12:14 PM   Clinical Narrative:   Patient discharging home today with [facility]. Resumption orders placed, Cory updated.    Final next level of care: Home w Home Health Services Barriers to Discharge: Barriers Resolved   Patient Goals and CMS Choice Patient states their goals for this hospitalization and ongoing recovery are:: return home CMS Medicare.gov Compare Post Acute Care list provided to:: Patient Choice offered to / list presented to : Patient      Discharge Placement                    Patient and family notified of of transfer: 05/01/24  Discharge Plan and Services Additional resources added to the After Visit Summary for   In-house Referral: Clinical Social Work Discharge Planning Services: CM Consult Post Acute Care Choice: Home Health                    HH Arranged: PT [facility] Agency: [facility] Date [facility] Agency Contacted: 04/27/24   Representative spoke with at [facility] Agency: Darleene  Social Drivers of Health (SDOH) Interventions SDOH Screenings   Food Insecurity: No Food Insecurity (04/23/2024)  Housing: Low Risk  (04/23/2024)  Transportation Needs: No Transportation Needs (04/23/2024)  Utilities: Not At Risk (04/23/2024)  Alcohol Screen: Low Risk  (09/27/2023)  Depression (PHQ2-9): Medium Risk (04/23/2024)  Financial Resource Strain: Low Risk  (09/27/2023)  Physical Activity: Inactive (09/27/2023)  Social Connections: Socially Integrated (01/21/2024)  Stress: No Stress Concern Present (09/27/2023)  Tobacco Use: Low Risk  (04/24/2024)  Health Literacy: Medium Risk (12/20/2023)   Received from [facility]     Readmission Risk Interventions    04/27/2024    9:59 AM 01/27/2024   11:48 AM 12/11/2023   11:11 AM  Readmission Risk  Prevention Plan  Transportation Screening Complete Complete Complete  HRI or Home Care Consult   Complete  Social Work Consult for Recovery Care Planning/Counseling   Complete  Palliative Care Screening   Not Applicable  Medication Review [employer]) Complete Complete Complete  HRI or Home Care Consult Complete Complete   SW Recovery Care/Counseling Consult Complete Complete   Palliative Care Screening Not Applicable Not Applicable   Skilled Nursing Facility Not Applicable Patient Refused

## 2024-05-01 NOTE — Discharge Summary (Addendum)
 Physician Discharge Summary   Patient: Kathleen [name] MRN: [id number] DOB: 1961/02/08  Admit date:     04/23/2024  Discharge date: 05/01/24  Discharge Physician: Adriana DELENA Grams   PCP: Alphonsa Glendia DELENA, MD   Recommendations at discharge:   Follow-up with PCP in 1 week Close follow-up with primary care, review of current medications, anticipating modification of medications as an outpatient Follow-up with urologist Dr. Antone urinary retention Foley catheter needs to be discontinued in 1 week  Discharge Diagnoses: Active Problems: Urine tract infection Urinary retention Acute on chronic respiratory failure    Mixed hyperlipidemia   Pulmonary HTN (HCC)   Vitamin D  deficiency   Osteoporosis with current pathological fracture    Kathleen [name]  is a 63 y.o. female, past medical history of interstitial lung disease, sarcoidosis, chronic respiratory failure with hypoxia on 5 L of oxygen  morbid obesity Addison's disease, bilateral hip avascular necrosis, lumbar back pain and lumbar compression fracture, status post recent hospitalization for kyphoplasty by IR and CIR discharge 02/25/2024. - Was sent by her PCP, as she does appear to be with worsening urinary retention and lower extremity edema, patient with urinary retention at baseline, husband assisting her with self cath in and out permanently, but it does appear she has been requiring it more often, will she is with worsening lower extremity edema. - In ED she was noted to have +3 lower extremity edema, she had Foley catheter inserted with 600 cc urine output, her UA is positive for pyuria, leukocytes and bacteriuria she is with history of Pseudomonas UTI (urine culture June/19/2025), her labs were significant for baseline hemoglobin at 9.8, chest x-ray with no acute findings, BNP within normal limit, CT abdomen pelvis with interval development of moderate compression deformity of L1, subacute to old fracture, and superior endplate  depression L3, L4, L5 vertebral bodies concerning for subacute to old fractures.  [facility] hospitalist consulted to admit. ===  UTI/urinary retention - Foley catheter was placed on admission, failed voiding trial yesterday 04/29/2024 Foley catheter was placed again --- discussed with urologist Dr. Selma -Who recommended continue Flomax  and Foley catheter and follow-up as an outpatient with Dr. Sherrilee (There is no urgency or emergency on this case therefore can follow-up as an outpatient-will not be able to see the patient as an inpatient at this point)     - At home patient chronically requiring In-N-Out catheterization - Patient will complete IV antibiotics 04/29/24   -Continue to maintain adequate hydration     Left pleural effusion  - On Lasix  -with minimal response - Requiring 4 L of oxygen , satting 93% - Chest x-ray consistent with left pleural effusion- -discussed with IR, status post evaluation, not enough fluid for thoracentesis    lower extremity edema - Venous Dopplers has rule out the presence of DVT - Will transition to oral diuretics and follow urine output response. - Echo demonstrating grade 1 diastolic dysfunction, positive increased pulmonary pressure and no significant valvular disorder.  Suboptimal images to rule out the presence of wall motion abnormalities. - Continue low-sodium diet and adequate protein intake discussed with patient.   Weakness/deconditioning - Appreciate assistance and recommendation by PT/OT - home health PT at discharge will be arranged     Sarcoidosis/interstitial lung disease/chronic hypoxic respiratory failure Complaining of cough and congestion this morning, chest x-ray   - On chronic oxygen  at baseline (4 to 5 L); stable - Continue the use of incentive spirometer/flutter valve - Continue as needed mucolytic's.   Vitamin D   deficiency/history of chronic lumbar compression fractures - Continue calcium  and vitamin D  supplementation -  Continue outpatient Fosamax  - Patient with history of lumbar compression fractures; continue as needed analgesics.   Morbid obesity -Body mass index is 35.08 kg/m.  -Low-calorie diet and portion control discussed with patient.   History of Addison's disease/chronic hypotension - Continue hydrocortisone  and midodrine . - Follow vital signs.   Hyperlipidemia - Continue statin.   Hypothyroidism - Continue Synthroid .   Hypokalemia -repleting orally   anxiety/depression - Resume home BuSpar , Atarax  and as needed low-dose Ativan    Chronic diastolic heart failure - 2D echo results demonstrating ejection fraction 60 to 65%; grade 1 diastolic dysfunction and without significant valvular disorder.  Elevated pulmonary pressures appreciated. - Patient with signs of increased lower extremity swelling most likely in the setting of hypoalbuminemia and venous insufficiency - BNP within normal limits and no vascular congestion appreciated on x-ray - Continue to follow daily weight   History of left femoral head avascular necrosis/chronic pain - Patient is not a surgical candidate per orthopedic service - This is secondary to chronic use of steroids in the past - Continue supportive care. - Continue as needed analgesics   Anemia of chronic disease  - Monitoring H&H-stable at baseline   Morbid obesity  Body mass index is 35.08 kg/m. - Patient is on steroids, discussed close follow-up with the PCP regarding weight loss programs and regimens     constipation/abdominal pain/intermittent nausea - CT scan demonstrating ileus versus partial SBO - Patient reports last bowel movement on Friday - Increase physical activity, treatment with Linzess , adjusted dose of MiraLAX  and Colace and continue use of PPI and Pepcid  will be provided - Continue as needed antiemetics     Disposition: Home health Diet recommendation:  Discharge Diet Orders (From admission, onward)     Start     Ordered    05/01/24 0000  Diet - low sodium heart healthy        05/01/24 1202           Cardiac and Carb modified diet DISCHARGE MEDICATION: Allergies as of 05/01/2024       Reactions   Bee Venom Anaphylaxis   Contrast Media [iodinated Contrast Media] Other (See Comments)   CT chest w/ contrast at OSH followed by SOB resolved w/ single dose steroids, never ENT swelling, intubation or pressors, has had multiple contrasted scans since   Bactrim  [sulfamethoxazole -trimethoprim ] Nausea And Vomiting   Codeine Itching        Medication List     TAKE these medications    acetaminophen  325 MG tablet Commonly known as: TYLENOL  Take 2 tablets (650 mg total) by mouth every 6 (six) hours as needed for mild pain (pain score 1-3) or fever (or Fever >/= 101).   alendronate  70 MG tablet Commonly known as: FOSAMAX  Take 1 tablet (70 mg total) by mouth every Saturday. Take with a full glass of water  on an empty stomach.   busPIRone  15 MG tablet Commonly known as: BUSPAR  Take 1 tablet (15 mg) by mouth 3 times daily.   Calcium  Citrate 333 MG Tabs Take one tablet by mouth 3 (three) times daily with meals.   cyanocobalamin  1000 MCG/ML injection Commonly known as: VITAMIN B12 Inject 1 mL (1,000 mcg total) into the skin daily.   DULoxetine  60 MG capsule Commonly known as: CYMBALTA  Take 1 capsule (60 mg total) by mouth daily.   ferrous sulfate  325 (65 FE) MG tablet Commonly known as: FeroSul Take 1  tablet (325 mg total) by mouth once a week. What changed: when to take this   folic acid  1 MG tablet Commonly known as: FOLVITE  Take 1 tablet (1 mg total) by mouth daily.   furosemide  20 MG tablet Commonly known as: LASIX  Take 1 tablet (20 mg total) by mouth daily. Start taking on: May 02, 2024 What changed:  how much to take how to take this when to take this additional instructions   hydrocortisone  10 MG tablet Commonly known as: CORTEF  Take 2 tablets by mouth at 8:00 am and 1 tablet  at lunch. (May double dose on sick days x 3-5 days)   levothyroxine  50 MCG tablet Commonly known as: SYNTHROID  Take 1 tablet by mouth daily before breakfast.   Linzess  145 MCG Caps capsule Generic drug: linaclotide  Take 1 capsule (145 mcg total) by mouth daily before breakfast.   midodrine  5 MG tablet Commonly known as: PROAMATINE  Take 1 tablet (5 mg total) by mouth 2 (two) times daily with a meal.   oxyCODONE  15 MG immediate release tablet Commonly known as: ROXICODONE  Take 1 tablet (15 mg total) by mouth every 4 (four) hours as needed for pain 04/04/24 What changed: reasons to take this   OXYGEN  Inhale 5 L into the lungs continuous.   pantoprazole  40 MG tablet Commonly known as: PROTONIX  Take 1 tablet (40 mg total) by mouth 2 (two) times daily.   polyethylene glycol 17 g packet Commonly known as: MIRALAX  / GLYCOLAX  Take 17 g by mouth 2 (two) times daily.   rosuvastatin  20 MG tablet Commonly known as: Crestor  Take 1 tablet (20 mg total) by mouth daily.   senna-docusate 8.6-50 MG tablet Commonly known as: Senokot-S Take 2 tablets by mouth at bedtime.   tamsulosin  0.4 MG Caps capsule Commonly known as: FLOMAX  Take 2 capsules (0.8 mg total) by mouth daily after supper.   topiramate  50 MG tablet Commonly known as: Topamax  Take 1 tablet (50 mg total) by mouth daily with lunch.   Vitamin D  (Ergocalciferol ) 1.25 MG (50000 UNIT) Caps capsule Commonly known as: DRISDOL  Take 1 capsule (50,000 Units total) by mouth every Sunday.   Yupelri  175 MCG/3ML nebulizer solution Generic drug: revefenacin  Take 3 mLs (175 mcg total) by nebulization daily.        Discharge Exam: Filed Weights   04/29/24 0406 04/30/24 0500 05/01/24 0500  Weight: 115 kg 110.9 kg 113.8 kg        General:  AAO x 3,  cooperative, no distress;   HEENT:  Normocephalic, PERRL, otherwise with in Normal limits   Neuro:  CNII-XII intact. , normal motor and sensation, reflexes intact   Lungs:    Clear to auscultation BL, Respirations unlabored,  No wheezes / crackles  Cardio:    S1/S2, RRR, No murmure, No Rubs or Gallops   Abdomen:  Soft, non-tender, bowel sounds active all four quadrants, no guarding or peritoneal signs.  Muscular  skeletal:  Limited exam -global generalized weaknesses - in bed, able to move all 4 extremities,   2+ pulses,  symmetric, No pitting edema  Skin:  Dry, warm to touch, negative for any Rashes,  Wounds: Please see nursing documentation      Condition at discharge: fair  The results of significant diagnostics from this hospitalization (including imaging, microbiology, ancillary and laboratory) are listed below for reference.   Imaging Studies: US  CHEST (PLEURAL EFFUSION) Result Date: 05/01/2024 CLINICAL DATA:  Patient admitted with dyspnea, imaging shows left pleural effusion. Request for possible  thoracentesis. EXAM: LIMITED CHEST ULTRASOUND COMPARISON:  CXR 04/23/24, 04/29/24 FINDINGS: Limited ultrasound examination of the right and left chest shows no pleural fluid. IMPRESSION: Bilateral chest ultrasound without pleural fluid. No procedure performed. Electronically Signed   By: Ester Sides M.D.   On: 05/01/2024 11:33   DG CHEST PORT 1 VIEW Result Date: 04/29/2024 CLINICAL DATA:  Shortness of breath EXAM: PORTABLE CHEST 1 VIEW COMPARISON:  04/23/2024 FINDINGS: Heart is normal size. Moderate left pleural effusion. Mild vascular congestion and bilateral perihilar and lower lobe airspace opacities. No acute bony abnormality. IMPRESSION: Perihilar and lower lobe airspace opacities could reflect edema or infection. Moderate left pleural effusion. Electronically Signed   By: Franky Crease M.D.   On: 04/29/2024 14:41   CT ABDOMEN PELVIS WO CONTRAST Result Date: 04/26/2024 CLINICAL DATA:  Acute abdominal pain and urinary retention. EXAM: CT ABDOMEN AND PELVIS WITHOUT CONTRAST TECHNIQUE: Multidetector CT imaging of the abdomen and pelvis was performed following the  standard protocol without IV contrast. RADIATION DOSE REDUCTION: This exam was performed according to the departmental dose-optimization program which includes automated exposure control, adjustment of the mA and/or kV according to patient size and/or use of iterative reconstruction technique. COMPARISON:  CT abdomen and pelvis 04/23/2024 FINDINGS: Lower chest: There some ground-glass opacities in both lung bases Hepatobiliary: The liver is enlarged. Gallbladder is surgically absent. There is no biliary ductal dilatation. Pancreas: There is fatty infiltration of the pancreas. No pancreatic ductal dilatation or surrounding inflammatory changes. Spleen: Normal in size without focal abnormality. Adrenals/Urinary Tract: The kidneys and adrenal glands appear within normal limits. Bladder is decompressed by Foley catheter. Stomach/Bowel: Stomach is within normal limits. Appendix appears normal. There some wall thickening of central small bowel loops. Jejunal loops just proximal to this level are mildly dilated with air-fluid levels. There is no inflammatory stranding. The stomach is nondilated. The colon appears normal. No pneumatosis or free air. Vascular/Lymphatic: Aortic atherosclerosis. No enlarged abdominal or pelvic lymph nodes. Reproductive: Uterus and bilateral adnexa are unremarkable. Other: No abdominal wall hernia or abnormality. No abdominopelvic ascites. Musculoskeletal: The bones are diffusely osteopenic. There stable compression fractures of L1, L3, L4, L5, and T11. there cystic changes and mild flattening of the left femoral head similar to the prior study compatible with avascular necrosis. IMPRESSION: 1. Mild wall thickening of central small bowel loops with mild dilatation of jejunal loops just proximal to this level. Findings may be related to enteritis. Early/partial small bowel obstruction not excluded. 2. Ground-glass opacities in both lung bases may be infectious/inflammatory. 3. Hepatomegaly. 4.  Stable compression fractures of the thoracolumbar spine. 5. 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 Aortic Atherosclerosis (ICD10-I70.0). Electronically Signed   By: Greig Pique M.D.   On: 04/26/2024 19:10   ECHOCARDIOGRAM COMPLETE Result Date: 04/24/2024    ECHOCARDIOGRAM REPORT   Patient Name:   ARTEMISA SLADEK Date of Exam: 04/24/2024 Medical Rec #:  [id number]      Height:       70.0 in Accession #:    [id number]     Weight:       262.8 lb Date of Birth:  June 29, 1961      BSA:          2.344 m Patient Age:    63 years       BP:           114/57 mmHg Patient Gender: F              HR:  87 bpm. Exam Location:  [name] Procedure: 2D Echo, Cardiac Doppler and Color Doppler (Both Spectral and Color            Flow Doppler were utilized during procedure). Indications:    Leg Edema  History:        Patient has prior history of Echocardiogram examinations, most                 recent 10/15/2016. Risk Factors:Dyslipidemia, Sleep Apnea and                 Prediabetic. Hx of sarcoidosis.  Sonographer:    Aida Pizza RCS Referring Phys: [id number] DAWOOD S SHERLON  Sonographer Comments: Somewhat difficult subcostal windows and suprasternal window. IMPRESSIONS  1. Left ventricular ejection fraction, by estimation, is 60 to 65%. The left ventricle has normal function. Left ventricular endocardial border not optimally defined to evaluate regional wall motion. There is mild left ventricular hypertrophy. Left ventricular diastolic parameters are consistent with Grade I diastolic dysfunction (impaired relaxation). Elevated left ventricular end-diastolic pressure.  2. Right ventricular systolic function is normal. The right ventricular size is mildly enlarged. Tricuspid regurgitation signal is inadequate for assessing PA pressure.  3. The mitral valve is normal in structure. No evidence of mitral valve regurgitation. No evidence of mitral stenosis.  4. The aortic valve has an indeterminant number of cusps.  Aortic valve regurgitation is not visualized. No aortic stenosis is present.  5. The inferior vena cava is normal in size with greater than 50% respiratory variability, suggesting right atrial pressure of 3 mmHg. Comparison(s): No prior Echocardiogram. FINDINGS  Left Ventricle: Left ventricular ejection fraction, by estimation, is 60 to 65%. The left ventricle has normal function. Left ventricular endocardial border not optimally defined to evaluate regional wall motion. Strain was performed and the global longitudinal strain is indeterminate. The left ventricular internal cavity size was normal in size. There is mild left ventricular hypertrophy. Left ventricular diastolic parameters are consistent with Grade I diastolic dysfunction (impaired relaxation).  Elevated left ventricular end-diastolic pressure. Right Ventricle: The right ventricular size is mildly enlarged. No increase in right ventricular wall thickness. Right ventricular systolic function is normal. Tricuspid regurgitation signal is inadequate for assessing PA pressure. Left Atrium: Left atrial size was normal in size. Right Atrium: Right atrial size was normal in size. Pericardium: There is no evidence of pericardial effusion. Mitral Valve: The mitral valve is normal in structure. No evidence of mitral valve regurgitation. No evidence of mitral valve stenosis. Tricuspid Valve: The tricuspid valve is normal in structure. Tricuspid valve regurgitation is not demonstrated. No evidence of tricuspid stenosis. Aortic Valve: The aortic valve has an indeterminant number of cusps. Aortic valve regurgitation is not visualized. No aortic stenosis is present. Aortic valve mean gradient measures 7.0 mmHg. Aortic valve peak gradient measures 12.7 mmHg. Aortic valve area, by VTI measures 1.87 cm. Pulmonic Valve: The pulmonic valve was grossly normal. Pulmonic valve regurgitation is not visualized. No evidence of pulmonic stenosis. Aorta: The aortic root is normal  in size and structure. Venous: The inferior vena cava is normal in size with greater than 50% respiratory variability, suggesting right atrial pressure of 3 mmHg. IAS/Shunts: No atrial level shunt detected by color flow Doppler. Additional Comments: 3D was performed not requiring image post processing on an independent workstation and was indeterminate.  LEFT VENTRICLE PLAX 2D LVIDd:         5.00 cm   Diastology LVIDs:  2.90 cm   LV e' medial:    5.47 cm/s LV PW:         1.20 cm   LV E/e' medial:  17.6 LV IVS:        1.10 cm   LV e' lateral:   9.32 cm/s LVOT diam:     1.90 cm   LV E/e' lateral: 10.3 LV SV:         65 LV SV Index:   28 LVOT Area:     2.84 cm  RIGHT VENTRICLE RV S prime:     11.70 cm/s TAPSE (M-mode): 1.8 cm LEFT ATRIUM             Index        RIGHT ATRIUM           Index LA diam:        3.80 cm 1.62 cm/m   RA Area:     13.70 cm LA Vol (A2C):   34.2 ml 14.59 ml/m  RA Volume:   34.60 ml  14.76 ml/m LA Vol (A4C):   45.4 ml 19.37 ml/m LA Biplane Vol: 41.9 ml 17.87 ml/m  AORTIC VALVE AV Area (Vmax):    2.05 cm AV Area (Vmean):   1.72 cm AV Area (VTI):     1.87 cm AV Vmax:           178.00 cm/s AV Vmean:          127.000 cm/s AV VTI:            0.348 m AV Peak Grad:      12.7 mmHg AV Mean Grad:      7.0 mmHg LVOT Vmax:         129.00 cm/s LVOT Vmean:        77.200 cm/s LVOT VTI:          0.230 m LVOT/AV VTI ratio: 0.66  AORTA Ao Root diam: 3.40 cm MITRAL VALVE MV Area (PHT): 2.91 cm    SHUNTS MV Decel Time: 261 msec    Systemic VTI:  0.23 m MV E velocity: 96.40 cm/s  Systemic Diam: 1.90 cm MV A velocity: 89.60 cm/s MV E/A ratio:  1.08 Vishnu Priya Mallipeddi Electronically signed by Diannah Late Mallipeddi Signature Date/Time: 04/24/2024/3:50:25 PM    Final    US  Venous Img Lower Bilateral (DVT) Result Date: 04/23/2024 CLINICAL DATA:  Bilateral lower extremity edema EXAM: BILATERAL LOWER EXTREMITY VENOUS DOPPLER ULTRASOUND TECHNIQUE: Gray-scale sonography with graded compression, as  well as color Doppler and duplex ultrasound were performed to evaluate the lower extremity deep venous systems from the level of the common femoral vein and including the common femoral, femoral, profunda femoral, popliteal and calf veins including the posterior tibial, peroneal and gastrocnemius veins when visible. The superficial great saphenous vein was also interrogated. Spectral Doppler was utilized to evaluate flow at rest and with distal augmentation maneuvers in the common femoral, femoral and popliteal veins. COMPARISON:  None Available. FINDINGS: RIGHT LOWER EXTREMITY Common Femoral Vein: No evidence of thrombus. Normal compressibility, respiratory phasicity and response to augmentation. Saphenofemoral Junction: No evidence of thrombus. Normal compressibility and flow on color Doppler imaging. Profunda Femoral Vein: No evidence of thrombus. Normal compressibility and flow on color Doppler imaging. Femoral Vein: No evidence of thrombus. Normal compressibility, respiratory phasicity and response to augmentation. Popliteal Vein: No evidence of thrombus. Normal compressibility, respiratory phasicity and response to augmentation. Calf Veins: No evidence of thrombus. Normal compressibility and flow on color Doppler  imaging. Superficial Great Saphenous Vein: No evidence of thrombus. Normal compressibility. Venous Reflux:  None. Other Findings:  None. LEFT LOWER EXTREMITY Common Femoral Vein: No evidence of thrombus. Normal compressibility, respiratory phasicity and response to augmentation. Saphenofemoral Junction: No evidence of thrombus. Normal compressibility and flow on color Doppler imaging. Profunda Femoral Vein: No evidence of thrombus. Normal compressibility and flow on color Doppler imaging. Femoral Vein: No evidence of thrombus. Normal compressibility, respiratory phasicity and response to augmentation. Popliteal Vein: No evidence of thrombus. Normal compressibility, respiratory phasicity and response  to augmentation. Calf Veins: No evidence of thrombus. Normal compressibility and flow on color Doppler imaging. Superficial Great Saphenous Vein: No evidence of thrombus. Normal compressibility. Venous Reflux:  None. Other Findings:  None. IMPRESSION: No evidence of deep venous thrombosis in either lower extremity. Electronically Signed   By: Wilkie Lent M.D.   On: 04/23/2024 16:46   CT ABDOMEN PELVIS WO CONTRAST Result Date: 04/23/2024 CLINICAL DATA:  Urinary retention. Acute generalized abdominal pain. EXAM: CT ABDOMEN AND PELVIS WITHOUT CONTRAST TECHNIQUE: Multidetector CT imaging of the abdomen and pelvis was performed following the standard protocol without IV contrast. RADIATION DOSE REDUCTION: This exam was performed according to the departmental dose-optimization program which includes automated exposure control, adjustment of the mA and/or kV according to patient size and/or use of iterative reconstruction technique. COMPARISON:  August 05, 2023. FINDINGS: Lower chest: No acute abnormality. Hepatobiliary: Hepatic steatosis. Status post cholecystectomy. No biliary dilatation. Pancreas: Fatty atrophy of pancreas is again noted. No acute inflammation is noted. Spleen: Normal in size without focal abnormality. Adrenals/Urinary Tract: Adrenal glands and kidneys are unremarkable. No hydronephrosis or renal obstruction is noted. Urinary bladder is decompressed secondary to Foley catheter. Stomach/Bowel: The stomach is unremarkable. There is no evidence of bowel obstruction or inflammation. Stool is noted throughout the colon. The appendix is not clearly visualized. Vascular/Lymphatic: Aortic atherosclerosis. No enlarged abdominal or pelvic lymph nodes. Reproductive: Uterus and bilateral adnexa are unremarkable. Other: No abdominal wall hernia or abnormality. No abdominopelvic ascites. Musculoskeletal: Stable old T11 fracture. Interval development moderate compression deformity of L1 vertebral body with  sclerosis concerning for subacute to old fracture. There also appears to be interval development of superior endplate depression of L3, L4 and L5 vertebral bodies concerning for subacute to old fractures. MRI may be performed for further evaluation. IMPRESSION: 1. Hepatic steatosis. 2. Interval development of moderate compression deformity of L1 vertebral body with sclerosis concerning for subacute to old fracture. There also appears to be interval development of superior endplate depression of L3, L4 and L5 vertebral bodies concerning for subacute to old fractures. MRI may be performed for further evaluation. 3. Aortic atherosclerosis. Aortic Atherosclerosis (ICD10-I70.0). Electronically Signed   By: Lynwood Landy Raddle M.D.   On: 04/23/2024 14:44   DG Chest Port 1 View Result Date: 04/23/2024 CLINICAL DATA:  Shortness of breath. EXAM: PORTABLE CHEST 1 VIEW COMPARISON:  02/19/2024. FINDINGS: The heart size and mediastinal contours are unchanged. Similar chronic scarring in the bilateral upper lung zones and left lung base. No evidence of overt edema, focal consolidation, sizeable pleural effusion or pneumothorax. No acute osseous abnormality. IMPRESSION: No acute cardiopulmonary findings. Electronically Signed   By: Harrietta Sherry M.D.   On: 04/23/2024 12:54    Microbiology: Results for orders placed or performed during the hospital encounter of 04/23/24  Urine Culture     Status: Abnormal   Collection Time: 04/23/24 12:29 PM   Specimen: Urine, Clean Catch  Result Value Ref Range Status  Specimen Description   Final    URINE, CLEAN CATCH Performed at [facility], [address] [place], [place] [address]    Special Requests   Final    NONE Performed at [facility], [address] [place], [place] [address]    Culture 80,000 COLONIES/mL PROVIDENCIA STUARTII (A)  Final   Report Status 04/25/2024 FINAL  Final   Organism ID, Bacteria PROVIDENCIA STUARTII (A)  Final      Susceptibility    Providencia stuartii - MIC*    AMPICILLIN RESISTANT Resistant     CEFEPIME  <=0.12 SENSITIVE Sensitive     ERTAPENEM <=0.12 SENSITIVE Sensitive     CEFTRIAXONE  <=0.25 SENSITIVE Sensitive     CIPROFLOXACIN  <=0.06 SENSITIVE Sensitive     GENTAMICIN RESISTANT Resistant     NITROFURANTOIN 256 RESISTANT Resistant     TRIMETH /SULFA  <=20 SENSITIVE Sensitive     AMPICILLIN/SULBACTAM 8 SENSITIVE Sensitive     PIP/TAZO Value in next row Sensitive ug/mL     <=4 SENSITIVEThis is a modified FDA-approved test that has been validated and its performance characteristics determined by the reporting laboratory.  This laboratory is certified under the Clinical Laboratory Improvement Amendments CLIA as qualified to perform high complexity clinical laboratory testing.    MEROPENEM Value in next row Sensitive      <=4 SENSITIVEThis is a modified FDA-approved test that has been validated and its performance characteristics determined by the reporting laboratory.  This laboratory is certified under the Clinical Laboratory Improvement Amendments CLIA as qualified to perform high complexity clinical laboratory testing.    * 80,000 COLONIES/mL PROVIDENCIA STUARTII   *Note: Due to a large number of results and/or encounters for the requested time period, some results have not been displayed. A complete set of results can be found in Results Review.    Labs: CBC: Recent Labs  Lab 04/30/24 0747 05/01/24 0421  WBC 9.1 9.2  NEUTROABS 6.6 6.6  HGB 8.9* 9.2*  HCT 29.8* 31.0*  MCV 91.4 90.9  PLT 445* 465*   Basic Metabolic Panel: Recent Labs  Lab 04/26/24 0504 04/28/24 0743 04/30/24 0747 05/01/24 0421  NA 140 136 136 140  K 3.7 3.2* 3.2* 3.4*  CL 95* 91* 95* 101  CO2 34* 31 29 29   GLUCOSE 129* 126* 127* 127*  BUN 10 9 8 9   CREATININE 0.86 0.90 0.70 0.77  CALCIUM  9.0 9.0 9.0 9.3  MG 2.4 2.3  --   --    Liver Function Tests: Recent Labs  Lab 04/30/24 0747 05/01/24 0421  AST 20 19  ALT 14 14   ALKPHOS 60 59  BILITOT 0.5 0.3  PROT 5.7* 5.8*  ALBUMIN 2.6* 2.8*   CBG: Recent Labs  Lab 04/26/24 1115 04/29/24 1818  GLUCAP 137* 142*    Discharge time spent: greater than 45 minutes.  Signed: Adriana DELENA Grams, MD [facility] 05/01/2024

## 2024-05-01 NOTE — Progress Notes (Signed)
 Patients tele order expired. MD Manfred made aware. No new orders at this time.

## 2024-05-01 NOTE — Progress Notes (Signed)
 Limited US  of the right and left chest shows no pleural fluid.   No thoracentesis performed. Imaging results discussed with patient.  Images are available for review under imaging section of Epic.  Please call with questions or concerns.   Kathleen [name]

## 2024-05-01 NOTE — Care Management Important Message (Signed)
 Important Message  Patient Details  Name: Kathleen [name] MRN: [id number] Date of Birth: 09-15-1960   Important Message Given:  Yes - Medicare IM     Karenann Mcgrory L Iysis Germain 05/01/2024, 10:30 AM

## 2024-05-02 ENCOUNTER — Other Ambulatory Visit: Payer: Self-pay | Admitting: Family Medicine

## 2024-05-02 ENCOUNTER — Other Ambulatory Visit (HOSPITAL_COMMUNITY): Payer: Self-pay

## 2024-05-04 ENCOUNTER — Other Ambulatory Visit (HOSPITAL_COMMUNITY): Payer: Self-pay

## 2024-05-04 ENCOUNTER — Other Ambulatory Visit: Payer: Self-pay

## 2024-05-04 MED ORDER — FOLIC ACID 1 MG PO TABS
1.0000 mg | ORAL_TABLET | Freq: Every day | ORAL | 0 refills | Status: AC
  Filled 2024-05-04: qty 30, 30d supply, fill #0

## 2024-05-04 MED ORDER — ONDANSETRON 8 MG PO TBDP
8.0000 mg | ORAL_TABLET | Freq: Three times a day (TID) | ORAL | 5 refills | Status: DC | PRN
  Filled 2024-05-04: qty 15, 5d supply, fill #0
  Filled 2024-05-24: qty 15, 5d supply, fill #1
  Filled 2024-06-17: qty 15, 5d supply, fill #2
  Filled 2024-06-25: qty 15, 5d supply, fill #3
  Filled 2024-07-12: qty 15, 5d supply, fill #4
  Filled 2024-07-21: qty 15, 5d supply, fill #5

## 2024-05-04 NOTE — Progress Notes (Unsigned)
 Chief Complaint: No chief complaint on file.   History of Present Illness:  Kathleen [name] is a 63 y.o. female who is seen in consultation from Alphonsa Glendia LABOR, MD for evaluation of urinary retention.   Past Medical History:  Past Medical History:  Diagnosis Date   Adrenal insufficiency (HCC)    Anemia    Anxiety    Avascular bone necrosis (HCC)    Breast fibrocystic disorder    Chronic pain    Encounter for long-term opiate analgesic use 02/02/2020   Patient has chronic pain with femoral head avascular necrosis.  Is under pain contract   Fibromyalgia    Gastroesophageal reflux disease    Hypothyroidism    Mitral valve prolapse    Orthostatic hypotension    Peripheral neuropathy    PONV (postoperative nausea and vomiting)    Restrictive lung disease    Moderate to severe   Sarcoidosis    Biopsy proven - [facility]   Sarcoidosis    SOB (shortness of breath)    chronic    Past Surgical History:  Past Surgical History:  Procedure Laterality Date   BIOPSY  10/17/2018   Procedure: BIOPSY;  Surgeon: Golda Claudis PENNER, MD;  [facility]: [facility];  Service: Endoscopy;;  duodenum, antrum, gastric body   BIOPSY  03/03/2021   Procedure: BIOPSY;  Surgeon: Golda Claudis PENNER, MD;  [facility]: [facility];  Service: Endoscopy;;   BIOPSY  09/11/2023   Procedure: BIOPSY;  Surgeon: Shaaron Lamar HERO, MD;  [facility]: [facility];  Service: Endoscopy;;   BREAST LUMPECTOMY Bilateral 01/12/2011   CHOLECYSTECTOMY N/A 04/17/2021   Procedure: LAPAROSCOPIC CHOLECYSTECTOMY;  Surgeon: Mavis Anes, MD;  Location: [facility];  Service: General;  Laterality: N/A;   COLONOSCOPY WITH PROPOFOL  N/A 01/03/2022   Procedure: COLONOSCOPY WITH PROPOFOL ;  Surgeon: Golda Claudis PENNER, MD;  [facility]: [facility];  Service: Endoscopy;  Laterality: N/A;  220   ESOPHAGEAL DILATION N/A 03/03/2021   Procedure: ESOPHAGEAL DILATION;  Surgeon: Golda Claudis PENNER, MD;  [facility];  Service: Endoscopy;  Laterality:  N/A;   ESOPHAGOGASTRODUODENOSCOPY (EGD) WITH PROPOFOL  N/A 10/17/2018   Procedure: ESOPHAGOGASTRODUODENOSCOPY (EGD) WITH PROPOFOL ;  Surgeon: Golda Claudis PENNER, MD;  [facility];  Service: Endoscopy;  Laterality: N/A;  2:25   ESOPHAGOGASTRODUODENOSCOPY (EGD) WITH PROPOFOL  N/A 03/03/2021   Procedure: ESOPHAGOGASTRODUODENOSCOPY (EGD) WITH PROPOFOL ;  Surgeon: Golda Claudis PENNER, MD;  [facility];  Service: Endoscopy;  Laterality: N/A;  12:15   ESOPHAGOGASTRODUODENOSCOPY (EGD) WITH PROPOFOL  N/A 09/11/2023   Procedure: ESOPHAGOGASTRODUODENOSCOPY (EGD) WITH PROPOFOL ;  Surgeon: Shaaron Lamar HERO, MD;  [facility];  Service: Endoscopy;  Laterality: N/A;  2:30 pm, asa 3, LM to see if pt can come earlier   [facility] DILATION N/A 09/11/2023   Procedure: AGAPITO DILATION;  Surgeon: Shaaron Lamar HERO, MD;  [facility]: [facility];  Service: Endoscopy;  Laterality: N/A;   ORIF ANKLE FRACTURE Left 03/10/2023   Procedure: OPEN REDUCTION INTERNAL FIXATION (ORIF) ANKLE FRACTURE;  Surgeon: Georgina Ozell LABOR, MD;  Location: [facility];  Service: Orthopedics;  Laterality: Left;   POLYPECTOMY  01/03/2022   Procedure: POLYPECTOMY INTESTINAL;  Surgeon: Golda Claudis PENNER, MD;  [facility];  Service: Endoscopy;;   TUBAL LIGATION      Allergies:  Allergies  Allergen Reactions   Bee Venom Anaphylaxis   Contrast Media [Iodinated Contrast Media] Other (See Comments)    CT chest w/ contrast at OSH followed by SOB  resolved w/ single dose steroids, never ENT swelling, intubation or pressors, has had multiple contrasted scans since   Bactrim  [Sulfamethoxazole -Trimethoprim ] Nausea And Vomiting   Codeine Itching    Family History:  Family History  Problem Relation Age of Onset   Cardiomyopathy Mother    Breast cancer Mother    Prostate cancer Father    Heart attack Maternal Grandmother    Heart attack Maternal Grandfather    Heart attack Paternal Grandmother    Bipolar disorder Daughter     Heart disease Maternal Aunt    Neurofibromatosis Cousin    Colon cancer Neg Hx     Social History:  Social History   Tobacco Use   Smoking status: Never    Passive exposure: Never   Smokeless tobacco: Never  Vaping Use   Vaping status: Never Used  Substance Use Topics   Alcohol use: No   Drug use: No    Review of symptoms:  Constitutional:  Negative for unexplained weight loss, night sweats, fever, chills ENT:  Negative for nose bleeds, sinus pain, painful swallowing CV:  Negative for chest pain, shortness of breath, exercise intolerance, palpitations, loss of consciousness Resp:  Negative for cough, wheezing, shortness of breath GI:  Negative for nausea, vomiting, diarrhea, bloody stools GU:  Positives noted in HPI; otherwise negative for gross hematuria, dysuria, urinary incontinence Neuro:  Negative for seizures, poor balance, limb weakness, slurred speech Psych:  Negative for lack of energy, depression, anxiety Endocrine:  Negative for polydipsia, polyuria, symptoms of hypoglycemia (dizziness, hunger, sweating) Hematologic:  Negative for anemia, purpura, petechia, prolonged or excessive bleeding, use of anticoagulants  Allergic:  Negative for difficulty breathing or choking as a result of exposure to anything; no shellfish allergy; no allergic response (rash/itch) to materials, foods  Physical exam: LMP 05/17/2016  GENERAL APPEARANCE:  Well appearing, well developed, well nourished, NAD HEENT: Atraumatic, Normocephalic, oropharynx clear. NECK: Supple without lymphadenopathy or thyromegaly. LUNGS: Clear to auscultation bilaterally. HEART: Regular Rate and Rhythm without murmurs, gallops, or rubs. ABDOMEN: Soft, non-tender, No Masses. EXTREMITIES: Moves all extremities well.  Without clubbing, cyanosis, or edema. NEUROLOGIC:  Alert and oriented x 3, normal gait, CN II-XII grossly intact.  MENTAL STATUS:  Appropriate. BACK:  Non-tender to palpation.  No CVAT SKIN:  Warm,  dry and intact.    Results: No results found. However, due to the size of the patient record, not all encounters were searched. Please check Results Review for a complete set of results.  I have reviewed prior patient's records  I have reviewed referring/prior physicians records  I have reviewed urinalysis  I have reviewed prior urine cultures  I reviewed prior imaging studies  Assessment: No diagnosis found.   Plan: ***

## 2024-05-05 ENCOUNTER — Ambulatory Visit: Admitting: Urology

## 2024-05-05 VITALS — BP 109/55 | HR 75

## 2024-05-05 DIAGNOSIS — R338 Other retention of urine: Secondary | ICD-10-CM

## 2024-05-05 DIAGNOSIS — N3000 Acute cystitis without hematuria: Secondary | ICD-10-CM | POA: Diagnosis not present

## 2024-05-05 DIAGNOSIS — R339 Retention of urine, unspecified: Secondary | ICD-10-CM

## 2024-05-05 DIAGNOSIS — R3915 Urgency of urination: Secondary | ICD-10-CM | POA: Diagnosis not present

## 2024-05-05 DIAGNOSIS — N390 Urinary tract infection, site not specified: Secondary | ICD-10-CM

## 2024-05-05 NOTE — Telephone Encounter (Signed)
 Apparently there is a request for physical therapy?  If so it would be fine to put in an order for this thank you  Reason leg weakness needs strengthening for walking

## 2024-05-05 NOTE — Progress Notes (Signed)
 Order received to complete in and out catherization for urine sample. Patient was cleaned and prepped in a sterle fashion with Betadinex3  A 14 fr catheter foley was inserted. Urine return was note 300 ml. Urine blood orange in color. Catheter was then removed. Patient tolerated procedure with no complications were noted.  Performed by Exie LANES CMA  Urine sent for routine urinalysis & culture

## 2024-05-05 NOTE — Telephone Encounter (Signed)
 fyi  Copied from CRM [id number]. Topic: Clinical - Home Health Verbal Orders >> May 05, 2024 10:31 AM Wyona SQUIBB wrote: Caller/Agency: [facility]   Callback Number: [phone] - secured Line   Service Requested: Physical Therapy  Any new concerns about the patient? Yes,delay start care scheduled for tomorrow 09/10 - requesting a specific Physical Therapy.

## 2024-05-06 ENCOUNTER — Telehealth: Payer: Self-pay | Admitting: *Deleted

## 2024-05-06 DIAGNOSIS — D869 Sarcoidosis, unspecified: Secondary | ICD-10-CM | POA: Diagnosis not present

## 2024-05-06 DIAGNOSIS — F32A Depression, unspecified: Secondary | ICD-10-CM | POA: Diagnosis not present

## 2024-05-06 DIAGNOSIS — K219 Gastro-esophageal reflux disease without esophagitis: Secondary | ICD-10-CM | POA: Diagnosis not present

## 2024-05-06 DIAGNOSIS — G629 Polyneuropathy, unspecified: Secondary | ICD-10-CM | POA: Diagnosis not present

## 2024-05-06 DIAGNOSIS — M87852 Other osteonecrosis, left femur: Secondary | ICD-10-CM | POA: Diagnosis not present

## 2024-05-06 DIAGNOSIS — G8929 Other chronic pain: Secondary | ICD-10-CM | POA: Diagnosis not present

## 2024-05-06 DIAGNOSIS — M87851 Other osteonecrosis, right femur: Secondary | ICD-10-CM | POA: Diagnosis not present

## 2024-05-06 DIAGNOSIS — D649 Anemia, unspecified: Secondary | ICD-10-CM | POA: Diagnosis not present

## 2024-05-06 DIAGNOSIS — E782 Mixed hyperlipidemia: Secondary | ICD-10-CM | POA: Diagnosis not present

## 2024-05-06 DIAGNOSIS — I272 Pulmonary hypertension, unspecified: Secondary | ICD-10-CM | POA: Diagnosis not present

## 2024-05-06 DIAGNOSIS — I341 Nonrheumatic mitral (valve) prolapse: Secondary | ICD-10-CM | POA: Diagnosis not present

## 2024-05-06 DIAGNOSIS — E876 Hypokalemia: Secondary | ICD-10-CM | POA: Diagnosis not present

## 2024-05-06 DIAGNOSIS — J9621 Acute and chronic respiratory failure with hypoxia: Secondary | ICD-10-CM | POA: Diagnosis not present

## 2024-05-06 DIAGNOSIS — M797 Fibromyalgia: Secondary | ICD-10-CM | POA: Diagnosis not present

## 2024-05-06 DIAGNOSIS — R339 Retention of urine, unspecified: Secondary | ICD-10-CM | POA: Diagnosis not present

## 2024-05-06 DIAGNOSIS — I5032 Chronic diastolic (congestive) heart failure: Secondary | ICD-10-CM | POA: Diagnosis not present

## 2024-05-06 DIAGNOSIS — F419 Anxiety disorder, unspecified: Secondary | ICD-10-CM | POA: Diagnosis not present

## 2024-05-06 DIAGNOSIS — I951 Orthostatic hypotension: Secondary | ICD-10-CM | POA: Diagnosis not present

## 2024-05-06 DIAGNOSIS — E039 Hypothyroidism, unspecified: Secondary | ICD-10-CM | POA: Diagnosis not present

## 2024-05-06 DIAGNOSIS — N390 Urinary tract infection, site not specified: Secondary | ICD-10-CM | POA: Diagnosis not present

## 2024-05-06 DIAGNOSIS — J849 Interstitial pulmonary disease, unspecified: Secondary | ICD-10-CM | POA: Diagnosis not present

## 2024-05-06 DIAGNOSIS — M8008XD Age-related osteoporosis with current pathological fracture, vertebra(e), subsequent encounter for fracture with routine healing: Secondary | ICD-10-CM | POA: Diagnosis not present

## 2024-05-06 DIAGNOSIS — Z466 Encounter for fitting and adjustment of urinary device: Secondary | ICD-10-CM | POA: Diagnosis not present

## 2024-05-06 LAB — URINALYSIS, ROUTINE W REFLEX MICROSCOPIC
Bilirubin, UA: NEGATIVE
Ketones, UA: NEGATIVE
Nitrite, UA: POSITIVE — AB
RBC, UA: NEGATIVE
Specific Gravity, UA: <1.005 — ABNORMAL LOW (ref 1.005–1.030)
Urobilinogen, Ur: 4 mg/dL — ABNORMAL HIGH (ref 0.2–1.0)
pH, UA: 5.5 (ref 5.0–7.5)

## 2024-05-06 LAB — MICROSCOPIC EXAMINATION: Bacteria, UA: NONE SEEN

## 2024-05-06 NOTE — Telephone Encounter (Signed)
 Copied from CRM [id number]. Topic: Clinical - Home Health Verbal Orders >> May 06, 2024 11:42 AM Nathanel BROCKS wrote: Caller/Agency: [facility] Callback Number: [phone] Service Requested: Physical Therapy Frequency: 2 week 2 1 week 4 Any new concerns about the patient? No

## 2024-05-06 NOTE — Telephone Encounter (Signed)
 Spoke with [facility] states it was just an fyi delay in start for PT today.

## 2024-05-06 NOTE — Telephone Encounter (Signed)
 May have verbal order for physical therapy

## 2024-05-07 ENCOUNTER — Encounter (HOSPITAL_COMMUNITY): Payer: Self-pay

## 2024-05-07 ENCOUNTER — Other Ambulatory Visit: Payer: Self-pay | Admitting: Family Medicine

## 2024-05-07 ENCOUNTER — Other Ambulatory Visit (HOSPITAL_COMMUNITY): Payer: Self-pay

## 2024-05-07 DIAGNOSIS — M545 Low back pain, unspecified: Secondary | ICD-10-CM | POA: Diagnosis not present

## 2024-05-07 DIAGNOSIS — M810 Age-related osteoporosis without current pathological fracture: Secondary | ICD-10-CM | POA: Diagnosis not present

## 2024-05-07 DIAGNOSIS — Z6836 Body mass index (BMI) 36.0-36.9, adult: Secondary | ICD-10-CM | POA: Diagnosis not present

## 2024-05-07 DIAGNOSIS — J984 Other disorders of lung: Secondary | ICD-10-CM | POA: Diagnosis not present

## 2024-05-07 DIAGNOSIS — S32000S Wedge compression fracture of unspecified lumbar vertebra, sequela: Secondary | ICD-10-CM | POA: Diagnosis not present

## 2024-05-07 DIAGNOSIS — Z79899 Other long term (current) drug therapy: Secondary | ICD-10-CM | POA: Diagnosis not present

## 2024-05-07 MED ORDER — OXYCODONE HCL 15 MG PO TABS
15.0000 mg | ORAL_TABLET | ORAL | 0 refills | Status: DC | PRN
  Filled 2024-05-07: qty 180, 30d supply, fill #0

## 2024-05-09 LAB — URINE CULTURE

## 2024-05-11 ENCOUNTER — Encounter (HOSPITAL_COMMUNITY): Payer: Self-pay | Admitting: *Deleted

## 2024-05-11 ENCOUNTER — Other Ambulatory Visit: Payer: Self-pay | Admitting: Urology

## 2024-05-11 ENCOUNTER — Other Ambulatory Visit (HOSPITAL_COMMUNITY): Payer: Self-pay

## 2024-05-11 ENCOUNTER — Ambulatory Visit: Payer: Self-pay

## 2024-05-11 ENCOUNTER — Inpatient Hospital Stay (HOSPITAL_COMMUNITY)
Admission: EM | Admit: 2024-05-11 | Discharge: 2024-05-23 | DRG: 286 | Disposition: A | Discharge status: Home Health | Attending: Internal Medicine | Admitting: Internal Medicine

## 2024-05-11 ENCOUNTER — Other Ambulatory Visit: Payer: Self-pay

## 2024-05-11 ENCOUNTER — Emergency Department (HOSPITAL_COMMUNITY)

## 2024-05-11 DIAGNOSIS — Z515 Encounter for palliative care: Secondary | ICD-10-CM | POA: Diagnosis not present

## 2024-05-11 DIAGNOSIS — I272 Pulmonary hypertension, unspecified: Secondary | ICD-10-CM | POA: Diagnosis not present

## 2024-05-11 DIAGNOSIS — R0609 Other forms of dyspnea: Secondary | ICD-10-CM | POA: Diagnosis not present

## 2024-05-11 DIAGNOSIS — M879 Osteonecrosis, unspecified: Secondary | ICD-10-CM | POA: Diagnosis not present

## 2024-05-11 DIAGNOSIS — Z9981 Dependence on supplemental oxygen: Secondary | ICD-10-CM | POA: Diagnosis not present

## 2024-05-11 DIAGNOSIS — D849 Immunodeficiency, unspecified: Secondary | ICD-10-CM | POA: Diagnosis not present

## 2024-05-11 DIAGNOSIS — J9611 Chronic respiratory failure with hypoxia: Secondary | ICD-10-CM | POA: Diagnosis not present

## 2024-05-11 DIAGNOSIS — F1120 Opioid dependence, uncomplicated: Secondary | ICD-10-CM | POA: Diagnosis present

## 2024-05-11 DIAGNOSIS — Z789 Other specified health status: Secondary | ICD-10-CM | POA: Diagnosis not present

## 2024-05-11 DIAGNOSIS — Z723 Lack of physical exercise: Secondary | ICD-10-CM

## 2024-05-11 DIAGNOSIS — Z91030 Bee allergy status: Secondary | ICD-10-CM

## 2024-05-11 DIAGNOSIS — Z79899 Other long term (current) drug therapy: Secondary | ICD-10-CM

## 2024-05-11 DIAGNOSIS — M797 Fibromyalgia: Secondary | ICD-10-CM | POA: Diagnosis present

## 2024-05-11 DIAGNOSIS — I480 Paroxysmal atrial fibrillation: Secondary | ICD-10-CM | POA: Diagnosis not present

## 2024-05-11 DIAGNOSIS — J849 Interstitial pulmonary disease, unspecified: Secondary | ICD-10-CM | POA: Diagnosis present

## 2024-05-11 DIAGNOSIS — F32A Depression, unspecified: Secondary | ICD-10-CM | POA: Diagnosis present

## 2024-05-11 DIAGNOSIS — G4733 Obstructive sleep apnea (adult) (pediatric): Secondary | ICD-10-CM | POA: Diagnosis present

## 2024-05-11 DIAGNOSIS — E039 Hypothyroidism, unspecified: Secondary | ICD-10-CM | POA: Diagnosis present

## 2024-05-11 DIAGNOSIS — K219 Gastro-esophageal reflux disease without esophagitis: Secondary | ICD-10-CM | POA: Diagnosis present

## 2024-05-11 DIAGNOSIS — Z711 Person with feared health complaint in whom no diagnosis is made: Secondary | ICD-10-CM | POA: Diagnosis not present

## 2024-05-11 DIAGNOSIS — Z7189 Other specified counseling: Secondary | ICD-10-CM | POA: Diagnosis not present

## 2024-05-11 DIAGNOSIS — R29890 Loss of height: Secondary | ICD-10-CM | POA: Diagnosis not present

## 2024-05-11 DIAGNOSIS — R0602 Shortness of breath: Secondary | ICD-10-CM | POA: Diagnosis not present

## 2024-05-11 DIAGNOSIS — D869 Sarcoidosis, unspecified: Secondary | ICD-10-CM | POA: Diagnosis not present

## 2024-05-11 DIAGNOSIS — Z1152 Encounter for screening for COVID-19: Secondary | ICD-10-CM

## 2024-05-11 DIAGNOSIS — E271 Primary adrenocortical insufficiency: Secondary | ICD-10-CM | POA: Diagnosis present

## 2024-05-11 DIAGNOSIS — D649 Anemia, unspecified: Secondary | ICD-10-CM | POA: Diagnosis present

## 2024-05-11 DIAGNOSIS — Z8249 Family history of ischemic heart disease and other diseases of the circulatory system: Secondary | ICD-10-CM

## 2024-05-11 DIAGNOSIS — Z6839 Body mass index (BMI) 39.0-39.9, adult: Secondary | ICD-10-CM

## 2024-05-11 DIAGNOSIS — R918 Other nonspecific abnormal finding of lung field: Secondary | ICD-10-CM | POA: Diagnosis not present

## 2024-05-11 DIAGNOSIS — F419 Anxiety disorder, unspecified: Secondary | ICD-10-CM | POA: Diagnosis not present

## 2024-05-11 DIAGNOSIS — Z7983 Long term (current) use of bisphosphonates: Secondary | ICD-10-CM

## 2024-05-11 DIAGNOSIS — I5033 Acute on chronic diastolic (congestive) heart failure: Principal | ICD-10-CM | POA: Diagnosis present

## 2024-05-11 DIAGNOSIS — G928 Other toxic encephalopathy: Secondary | ICD-10-CM | POA: Diagnosis not present

## 2024-05-11 DIAGNOSIS — N3000 Acute cystitis without hematuria: Secondary | ICD-10-CM

## 2024-05-11 DIAGNOSIS — Z8709 Personal history of other diseases of the respiratory system: Secondary | ICD-10-CM

## 2024-05-11 DIAGNOSIS — R601 Generalized edema: Secondary | ICD-10-CM | POA: Diagnosis not present

## 2024-05-11 DIAGNOSIS — E876 Hypokalemia: Secondary | ICD-10-CM | POA: Diagnosis not present

## 2024-05-11 DIAGNOSIS — R339 Retention of urine, unspecified: Secondary | ICD-10-CM | POA: Diagnosis not present

## 2024-05-11 DIAGNOSIS — Z91041 Radiographic dye allergy status: Secondary | ICD-10-CM

## 2024-05-11 DIAGNOSIS — G629 Polyneuropathy, unspecified: Secondary | ICD-10-CM | POA: Diagnosis not present

## 2024-05-11 DIAGNOSIS — R457 State of emotional shock and stress, unspecified: Secondary | ICD-10-CM | POA: Diagnosis not present

## 2024-05-11 DIAGNOSIS — K5900 Constipation, unspecified: Secondary | ICD-10-CM | POA: Diagnosis present

## 2024-05-11 DIAGNOSIS — Z7952 Long term (current) use of systemic steroids: Secondary | ICD-10-CM

## 2024-05-11 DIAGNOSIS — R600 Localized edema: Secondary | ICD-10-CM | POA: Diagnosis present

## 2024-05-11 DIAGNOSIS — R609 Edema, unspecified: Secondary | ICD-10-CM | POA: Diagnosis not present

## 2024-05-11 DIAGNOSIS — M4856XA Collapsed vertebra, not elsewhere classified, lumbar region, initial encounter for fracture: Secondary | ICD-10-CM | POA: Diagnosis not present

## 2024-05-11 DIAGNOSIS — R079 Chest pain, unspecified: Secondary | ICD-10-CM | POA: Diagnosis not present

## 2024-05-11 DIAGNOSIS — G894 Chronic pain syndrome: Secondary | ICD-10-CM | POA: Diagnosis not present

## 2024-05-11 DIAGNOSIS — E785 Hyperlipidemia, unspecified: Secondary | ICD-10-CM | POA: Diagnosis present

## 2024-05-11 DIAGNOSIS — Z7401 Bed confinement status: Secondary | ICD-10-CM

## 2024-05-11 DIAGNOSIS — I509 Heart failure, unspecified: Secondary | ICD-10-CM | POA: Diagnosis not present

## 2024-05-11 DIAGNOSIS — Z885 Allergy status to narcotic agent status: Secondary | ICD-10-CM

## 2024-05-11 DIAGNOSIS — I9589 Other hypotension: Secondary | ICD-10-CM | POA: Diagnosis present

## 2024-05-11 DIAGNOSIS — R634 Abnormal weight loss: Secondary | ICD-10-CM | POA: Diagnosis not present

## 2024-05-11 DIAGNOSIS — Z79890 Hormone replacement therapy (postmenopausal): Secondary | ICD-10-CM

## 2024-05-11 DIAGNOSIS — D860 Sarcoidosis of lung: Secondary | ICD-10-CM | POA: Diagnosis present

## 2024-05-11 DIAGNOSIS — Z6836 Body mass index (BMI) 36.0-36.9, adult: Secondary | ICD-10-CM

## 2024-05-11 DIAGNOSIS — E66812 Obesity, class 2: Secondary | ICD-10-CM | POA: Diagnosis present

## 2024-05-11 DIAGNOSIS — I5032 Chronic diastolic (congestive) heart failure: Secondary | ICD-10-CM | POA: Diagnosis present

## 2024-05-11 DIAGNOSIS — Z9181 History of falling: Secondary | ICD-10-CM

## 2024-05-11 DIAGNOSIS — R0989 Other specified symptoms and signs involving the circulatory and respiratory systems: Secondary | ICD-10-CM | POA: Diagnosis not present

## 2024-05-11 DIAGNOSIS — J90 Pleural effusion, not elsewhere classified: Secondary | ICD-10-CM | POA: Diagnosis not present

## 2024-05-11 DIAGNOSIS — Z882 Allergy status to sulfonamides status: Secondary | ICD-10-CM

## 2024-05-11 LAB — CBC WITH DIFFERENTIAL/PLATELET
Abs Immature Granulocytes: 0.1 K/uL — ABNORMAL HIGH (ref 0.00–0.07)
Basophils Absolute: 0.1 K/uL (ref 0.0–0.1)
Basophils Relative: 1 %
Eosinophils Absolute: 0.5 K/uL (ref 0.0–0.5)
Eosinophils Relative: 6 %
HCT: 29.4 % — ABNORMAL LOW (ref 36.0–46.0)
Hemoglobin: 8.6 g/dL — ABNORMAL LOW (ref 12.0–15.0)
Immature Granulocytes: 1 %
Lymphocytes Relative: 11 %
Lymphs Abs: 1 K/uL (ref 0.7–4.0)
MCH: 27 pg (ref 26.0–34.0)
MCHC: 29.3 g/dL — ABNORMAL LOW (ref 30.0–36.0)
MCV: 92.2 fL (ref 80.0–100.0)
Monocytes Absolute: 0.8 K/uL (ref 0.1–1.0)
Monocytes Relative: 8 %
Neutro Abs: 6.8 K/uL (ref 1.7–7.7)
Neutrophils Relative %: 73 %
Platelets: 456 K/uL — ABNORMAL HIGH (ref 150–400)
RBC: 3.19 MIL/uL — ABNORMAL LOW (ref 3.87–5.11)
RDW: 17.2 % — ABNORMAL HIGH (ref 11.5–15.5)
WBC: 9.2 K/uL (ref 4.0–10.5)
nRBC: 0 % (ref 0.0–0.2)

## 2024-05-11 LAB — RESP PANEL BY RT-PCR (RSV, FLU A&B, COVID)  RVPGX2
Influenza A by PCR: NEGATIVE
Influenza B by PCR: NEGATIVE
Resp Syncytial Virus by PCR: NEGATIVE
SARS Coronavirus 2 by RT PCR: NEGATIVE

## 2024-05-11 LAB — COMPREHENSIVE METABOLIC PANEL WITH GFR
ALT: 19 U/L (ref 0–44)
AST: 22 U/L (ref 15–41)
Albumin: 2.8 g/dL — ABNORMAL LOW (ref 3.5–5.0)
Alkaline Phosphatase: 64 U/L (ref 38–126)
Anion gap: 11 (ref 5–15)
BUN: 5 mg/dL — ABNORMAL LOW (ref 8–23)
CO2: 29 mmol/L (ref 22–32)
Calcium: 8.7 mg/dL — ABNORMAL LOW (ref 8.9–10.3)
Chloride: 102 mmol/L (ref 98–111)
Creatinine, Ser: 0.7 mg/dL (ref 0.44–1.00)
GFR, Estimated: >60 mL/min (ref >60)
Glucose, Bld: 122 mg/dL — ABNORMAL HIGH (ref 70–99)
Potassium: 3.6 mmol/L (ref 3.5–5.1)
Sodium: 142 mmol/L (ref 135–145)
Total Bilirubin: 0.5 mg/dL (ref 0.0–1.2)
Total Protein: 5.9 g/dL — ABNORMAL LOW (ref 6.5–8.1)

## 2024-05-11 LAB — URINALYSIS, ROUTINE W REFLEX MICROSCOPIC
Bilirubin Urine: NEGATIVE
Glucose, UA: NEGATIVE mg/dL
Hgb urine dipstick: NEGATIVE
Ketones, ur: NEGATIVE mg/dL
Leukocytes,Ua: NEGATIVE
Nitrite: NEGATIVE
Protein, ur: NEGATIVE mg/dL
Specific Gravity, Urine: 1.004 — ABNORMAL LOW (ref 1.005–1.030)
pH: 7 (ref 5.0–8.0)

## 2024-05-11 LAB — TROPONIN I (HIGH SENSITIVITY)
Troponin I (High Sensitivity): 5 ng/L (ref <18)
Troponin I (High Sensitivity): 5 ng/L (ref <18)

## 2024-05-11 LAB — GLUCOSE, CAPILLARY: Glucose-Capillary: 174 mg/dL — ABNORMAL HIGH (ref 70–99)

## 2024-05-11 LAB — LIPASE, BLOOD: Lipase: 26 U/L (ref 11–51)

## 2024-05-11 LAB — BRAIN NATRIURETIC PEPTIDE: B Natriuretic Peptide: 53 pg/mL (ref 0.0–100.0)

## 2024-05-11 MED ORDER — LEVOTHYROXINE SODIUM 50 MCG PO TABS
50.0000 ug | ORAL_TABLET | Freq: Every day | ORAL | Status: DC
  Administered 2024-05-12 – 2024-05-23 (×12): 50 ug via ORAL
  Filled 2024-05-11 (×12): qty 1

## 2024-05-11 MED ORDER — SENNOSIDES-DOCUSATE SODIUM 8.6-50 MG PO TABS
2.0000 | ORAL_TABLET | Freq: Every day | ORAL | Status: DC
  Administered 2024-05-12 – 2024-05-22 (×8): 2 via ORAL
  Filled 2024-05-11 (×12): qty 2

## 2024-05-11 MED ORDER — POLYETHYLENE GLYCOL 3350 17 G PO PACK
17.0000 g | PACK | Freq: Two times a day (BID) | ORAL | Status: DC
  Administered 2024-05-14 – 2024-05-22 (×7): 17 g via ORAL
  Filled 2024-05-11 (×20): qty 1

## 2024-05-11 MED ORDER — HYDROCORTISONE 10 MG PO TABS: 10.0000 mg | ORAL_TABLET | Freq: Two times a day (BID) | ORAL | Status: DC

## 2024-05-11 MED ORDER — OXYCODONE HCL 5 MG PO TABS
15.0000 mg | ORAL_TABLET | ORAL | Status: DC | PRN
  Administered 2024-05-12 – 2024-05-23 (×41): 15 mg via ORAL
  Filled 2024-05-11 (×45): qty 3

## 2024-05-11 MED ORDER — BUSPIRONE HCL 5 MG PO TABS
15.0000 mg | ORAL_TABLET | Freq: Three times a day (TID) | ORAL | Status: DC
  Administered 2024-05-11 – 2024-05-23 (×35): 15 mg via ORAL
  Filled 2024-05-11 (×36): qty 1

## 2024-05-11 MED ORDER — ROSUVASTATIN CALCIUM 20 MG PO TABS
20.0000 mg | ORAL_TABLET | Freq: Every day | ORAL | Status: DC
  Administered 2024-05-12 – 2024-05-23 (×12): 20 mg via ORAL
  Filled 2024-05-11 (×12): qty 1

## 2024-05-11 MED ORDER — LORAZEPAM 1 MG PO TABS
1.0000 mg | ORAL_TABLET | Freq: Once | ORAL | Status: DC
  Filled 2024-05-11 (×2): qty 1

## 2024-05-11 MED ORDER — TOPIRAMATE 25 MG PO TABS
50.0000 mg | ORAL_TABLET | Freq: Every day | ORAL | Status: DC
  Administered 2024-05-12 – 2024-05-23 (×12): 50 mg via ORAL
  Filled 2024-05-11 (×12): qty 2

## 2024-05-11 MED ORDER — FUROSEMIDE 10 MG/ML IJ SOLN
80.0000 mg | Freq: Once | INTRAMUSCULAR | Status: AC
Start: 2024-05-11 — End: 2024-05-11
  Administered 2024-05-11: 80 mg via INTRAVENOUS
  Filled 2024-05-11: qty 8

## 2024-05-11 MED ORDER — ACETAMINOPHEN 325 MG PO TABS
650.0000 mg | ORAL_TABLET | Freq: Four times a day (QID) | ORAL | Status: DC | PRN
  Administered 2024-05-16 – 2024-05-20 (×3): 650 mg via ORAL
  Filled 2024-05-11 (×3): qty 2

## 2024-05-11 MED ORDER — METHYLPREDNISOLONE SODIUM SUCC 125 MG IJ SOLR
125.0000 mg | Freq: Once | INTRAMUSCULAR | Status: AC
  Administered 2024-05-11: 125 mg via INTRAVENOUS
  Filled 2024-05-11: qty 2

## 2024-05-11 MED ORDER — ONDANSETRON HCL 4 MG PO TABS
4.0000 mg | ORAL_TABLET | Freq: Four times a day (QID) | ORAL | Status: DC | PRN
  Administered 2024-05-22: 4 mg via ORAL
  Filled 2024-05-11: qty 1

## 2024-05-11 MED ORDER — MIDODRINE HCL 5 MG PO TABS
5.0000 mg | ORAL_TABLET | Freq: Once | ORAL | Status: AC
  Administered 2024-05-11: 5 mg via ORAL
  Filled 2024-05-11: qty 1

## 2024-05-11 MED ORDER — HYDROCORTISONE 10 MG PO TABS
10.0000 mg | ORAL_TABLET | Freq: Every day | ORAL | Status: DC
  Administered 2024-05-12 – 2024-05-23 (×10): 10 mg via ORAL
  Filled 2024-05-11 (×13): qty 1

## 2024-05-11 MED ORDER — ACETAMINOPHEN 650 MG RE SUPP: 650.0000 mg | Freq: Four times a day (QID) | RECTAL | Status: DC | PRN

## 2024-05-11 MED ORDER — ALBUTEROL SULFATE (2.5 MG/3ML) 0.083% IN NEBU
2.5000 mg | INHALATION_SOLUTION | RESPIRATORY_TRACT | Status: DC | PRN
  Administered 2024-05-12 – 2024-05-13 (×3): 2.5 mg via RESPIRATORY_TRACT
  Filled 2024-05-11 (×3): qty 3

## 2024-05-11 MED ORDER — AMOXICILLIN 875 MG PO TABS
875.0000 mg | ORAL_TABLET | Freq: Two times a day (BID) | ORAL | 0 refills | Status: DC
  Filled 2024-05-11: qty 14, 7d supply, fill #0

## 2024-05-11 MED ORDER — FUROSEMIDE 10 MG/ML IJ SOLN
40.0000 mg | Freq: Two times a day (BID) | INTRAMUSCULAR | Status: DC
  Administered 2024-05-12: 40 mg via INTRAVENOUS
  Filled 2024-05-11: qty 4

## 2024-05-11 MED ORDER — HYDROCORTISONE 20 MG PO TABS
20.0000 mg | ORAL_TABLET | Freq: Every day | ORAL | Status: DC
  Administered 2024-05-12 – 2024-05-23 (×11): 20 mg via ORAL
  Filled 2024-05-11 (×12): qty 1

## 2024-05-11 MED ORDER — ONDANSETRON HCL 4 MG/2ML IJ SOLN
4.0000 mg | Freq: Four times a day (QID) | INTRAMUSCULAR | Status: DC | PRN
  Administered 2024-05-12 – 2024-05-23 (×9): 4 mg via INTRAVENOUS
  Filled 2024-05-11 (×8): qty 2

## 2024-05-11 MED ORDER — PANTOPRAZOLE SODIUM 40 MG PO TBEC
40.0000 mg | DELAYED_RELEASE_TABLET | Freq: Two times a day (BID) | ORAL | Status: DC
  Administered 2024-05-11 – 2024-05-23 (×24): 40 mg via ORAL
  Filled 2024-05-11 (×25): qty 1

## 2024-05-11 MED ORDER — LORAZEPAM 1 MG PO TABS
2.0000 mg | ORAL_TABLET | Freq: Once | ORAL | Status: AC
  Administered 2024-05-11: 2 mg via ORAL
  Filled 2024-05-11: qty 2

## 2024-05-11 MED ORDER — ENOXAPARIN SODIUM 60 MG/0.6ML IJ SOSY
0.5000 mg/kg | PREFILLED_SYRINGE | INTRAMUSCULAR | Status: DC
  Administered 2024-05-11: 62.5 mg via SUBCUTANEOUS
  Filled 2024-05-11: qty 1.2

## 2024-05-11 MED ORDER — MIDODRINE HCL 5 MG PO TABS
5.0000 mg | ORAL_TABLET | Freq: Two times a day (BID) | ORAL | Status: DC
Start: 2024-05-12 — End: 2024-05-23
  Administered 2024-05-12 – 2024-05-23 (×23): 5 mg via ORAL
  Filled 2024-05-11 (×24): qty 1

## 2024-05-11 MED ORDER — LINACLOTIDE 145 MCG PO CAPS
145.0000 ug | ORAL_CAPSULE | Freq: Every day | ORAL | Status: DC
  Administered 2024-05-12 – 2024-05-23 (×12): 145 ug via ORAL
  Filled 2024-05-11 (×12): qty 1

## 2024-05-11 NOTE — ED Notes (Signed)
 ED Provider at bedside.

## 2024-05-11 NOTE — Assessment & Plan Note (Signed)
 Not on CPAP

## 2024-05-11 NOTE — Assessment & Plan Note (Signed)
 Resume Cymbalta , buspirone .

## 2024-05-11 NOTE — H&P (Addendum)
 History and Physical    Kathleen [name] FMW:[id number] DOB: 07-18-1961 DOA: 05/11/2024  PCP: Alphonsa Glendia LABOR, MD   Patient coming from: Home  I have personally briefly reviewed patient's old medical records in [facility]  Chief Complaint: Difficulty breathing  HPI: Kathleen [name] is a 63 y.o. female with medical history significant for congestive heart failure, sarcoidosis, chronic respiratory failure on 5 L, adrenal insufficiency, chronic pain. Patient presented to the ED with complaints of bilateral lower extremity swelling, abdominal distention, difficulty breathing, and weight gain that started a couple of days after discharge from the hospital.  She reports compliance with her medications-Lasix  20 mg daily.  She reports reduced response to Lasix .  She reports mid central sharp chest pain that sometimes radiates down her left arm, chest pain is intermittent with no aggravating factors and sometimes occurs at rest.  Recent hospitalization 8/28 to 9/25 for UTI, urinary retention, bilateral lower extremity edema.  Treated with antibiotics and diuretics.  ED Course:  Temp 97.6.  Heart rate 90-99.  Respirate 18-23.  Blood pressure systolic 91-137.  O2 sats-92 to 99%, on 4-1/2 L O2. Chest x-ray- similar small left pleural effusion with left basilar opacity. Similar pulmonary vascular congestion. BNP 53. COVID, influenza RSV negative. IV Lasix  80 mg x 1 given. IV Solu-Medrol  125 mg x 1 given for ILD. Hospitalist to admit for volume overload.  Review of Systems: As per HPI all other systems reviewed and negative.  Past Medical History:  Diagnosis Date   Adrenal insufficiency (HCC)    Anemia    Anxiety    Avascular bone necrosis (HCC)    Breast fibrocystic disorder    Chronic pain    Encounter for long-term opiate analgesic use 02/02/2020   Patient has chronic pain with femoral head avascular necrosis.  Is under pain contract   Fibromyalgia    Gastroesophageal reflux disease     Hypothyroidism    Mitral valve prolapse    Orthostatic hypotension    Peripheral neuropathy    PONV (postoperative nausea and vomiting)    Restrictive lung disease    Moderate to severe   Sarcoidosis    Biopsy proven - [facility]   Sarcoidosis    SOB (shortness of breath)    chronic    Past Surgical History:  Procedure Laterality Date   BIOPSY  10/17/2018   Procedure: BIOPSY;  Surgeon: Golda Claudis PENNER, MD;  [facility]: [facility];  Service: Endoscopy;;  duodenum, antrum, gastric body   BIOPSY  03/03/2021   Procedure: BIOPSY;  Surgeon: Golda Claudis PENNER, MD;  [facility]: [facility];  Service: Endoscopy;;   BIOPSY  09/11/2023   Procedure: BIOPSY;  Surgeon: Shaaron Lamar HERO, MD;  [facility];  Service: Endoscopy;;   BREAST LUMPECTOMY Bilateral 01/12/2011   CHOLECYSTECTOMY N/A 04/17/2021   Procedure: LAPAROSCOPIC CHOLECYSTECTOMY;  Surgeon: Mavis Anes, MD;  Location: [facility];  Service: General;  Laterality: N/A;   COLONOSCOPY WITH PROPOFOL  N/A 01/03/2022   Procedure: COLONOSCOPY WITH PROPOFOL ;  Surgeon: Golda Claudis PENNER, MD;  [facility]: [facility];  Service: Endoscopy;  Laterality: N/A;  220   ESOPHAGEAL DILATION N/A 03/03/2021   Procedure: ESOPHAGEAL DILATION;  Surgeon: Golda Claudis PENNER, MD;  [facility];  Service: Endoscopy;  Laterality: N/A;   ESOPHAGOGASTRODUODENOSCOPY (EGD) WITH PROPOFOL  N/A 10/17/2018   Procedure: ESOPHAGOGASTRODUODENOSCOPY (EGD) WITH PROPOFOL ;  Surgeon: Golda Claudis PENNER, MD;  [facility];  Service: Endoscopy;  Laterality: N/A;  2:25  ESOPHAGOGASTRODUODENOSCOPY (EGD) WITH PROPOFOL  N/A 03/03/2021   Procedure: ESOPHAGOGASTRODUODENOSCOPY (EGD) WITH PROPOFOL ;  Surgeon: Golda Claudis PENNER, MD;  [facility];  Service: Endoscopy;  Laterality: N/A;  12:15   ESOPHAGOGASTRODUODENOSCOPY (EGD) WITH PROPOFOL  N/A 09/11/2023   Procedure: ESOPHAGOGASTRODUODENOSCOPY (EGD) WITH PROPOFOL ;  Surgeon: Shaaron Lamar HERO, MD;  [facility];  Service: Endoscopy;  Laterality: N/A;  2:30 pm, asa 3, LM to see if pt can come earlier   [facility] DILATION N/A 09/11/2023   Procedure: AGAPITO DILATION;  Surgeon: Shaaron Lamar HERO, MD;  [facility]: [facility];  Service: Endoscopy;  Laterality: N/A;   ORIF ANKLE FRACTURE Left 03/10/2023   Procedure: OPEN REDUCTION INTERNAL FIXATION (ORIF) ANKLE FRACTURE;  Surgeon: Georgina Ozell LABOR, MD;  Location: [facility];  Service: Orthopedics;  Laterality: Left;   POLYPECTOMY  01/03/2022   Procedure: POLYPECTOMY INTESTINAL;  Surgeon: Golda Claudis PENNER, MD;  [facility];  Service: Endoscopy;;   TUBAL LIGATION       reports that she has never smoked. She has never been exposed to tobacco smoke. She has never used smokeless tobacco. She reports that she does not drink alcohol and does not use drugs.  Allergies  Allergen Reactions   Bee Venom Anaphylaxis   Contrast Media [Iodinated Contrast Media] Other (See Comments)    CT chest w/ contrast at OSH followed by SOB resolved w/ single dose steroids, never ENT swelling, intubation or pressors, has had multiple contrasted scans since   Bactrim  [Sulfamethoxazole -Trimethoprim ] Nausea And Vomiting   Codeine Itching    Family History  Problem Relation Age of Onset   Cardiomyopathy Mother    Breast cancer Mother    Prostate cancer Father    Heart attack Maternal Grandmother    Heart attack Maternal Grandfather    Heart attack Paternal Grandmother    Bipolar disorder Daughter    Heart disease Maternal Aunt    Neurofibromatosis Cousin    Colon cancer Neg Hx     Prior to Admission medications   Medication Sig Start Date End Date Taking? Authorizing Provider  acetaminophen  (TYLENOL ) 325 MG tablet Take 2 tablets (650 mg total) by mouth every 6 (six) hours as needed for mild pain (pain score 1-3) or fever (or Fever >/= 101). 02/19/24  Yes Angiulli, Toribio PARAS, PA-C  alendronate  (FOSAMAX ) 70 MG tablet Take 1 tablet (70 mg total) by mouth every Saturday.  Take with a full glass of water  on an empty stomach. 03/14/24  Yes Luking, Glendia LABOR, MD  amoxicillin  (AMOXIL ) 875 MG tablet Take 1 tablet (875 mg total) by mouth every 12 (twelve) hours. 05/11/24  Yes Dahlstedt, Garnette, MD  busPIRone  (BUSPAR ) 15 MG tablet Take 1 tablet (15 mg) by mouth 3 times daily. 03/09/24  Yes Alphonsa Glendia LABOR, MD  Calcium  Citrate 333 MG TABS Take one tablet by mouth 3 (three) times daily with meals. 03/17/24  Yes Nida, Ethelle ORN, MD  ferrous sulfate  (FEROSUL) 325 (65 FE) MG tablet Take 1 tablet (325 mg total) by mouth once a week. Patient taking differently: Take 325 mg by mouth every Friday. 04/17/24  Yes Alphonsa Glendia LABOR, MD  folic acid  (FOLVITE ) 1 MG tablet Take 1 tablet (1 mg total) by mouth daily. 05/04/24  Yes Alphonsa Glendia LABOR, MD  furosemide  (LASIX ) 20 MG tablet Take 1 tablet (20 mg total) by mouth daily. 05/02/24 06/02/24 Yes Shahmehdi, Adriana LABOR, MD  hydrocortisone  (CORTEF ) 10 MG tablet Take 2 tablets by mouth at 8:00 am and  1 tablet at lunch. (May double dose on sick days x 3-5 days) 11/14/23  Yes Nida, Ethelle ORN, MD  levothyroxine  (SYNTHROID ) 50 MCG tablet Take 1 tablet by mouth daily before breakfast. 03/09/24 09/05/24 Yes Luking, Glendia LABOR, MD  linaclotide  (LINZESS ) 145 MCG CAPS capsule Take 1 capsule (145 mcg total) by mouth daily before breakfast. 03/09/24  Yes Luking, Glendia LABOR, MD  midodrine  (PROAMATINE ) 5 MG tablet Take 1 tablet (5 mg total) by mouth 2 (two) times daily with a meal. 03/09/24  Yes Luking, Glendia LABOR, MD  ondansetron  (ZOFRAN -ODT) 8 MG disintegrating tablet Dissolve 1 tablet (8 mg total) by mouth every 8 (eight) hours as needed. Patient taking differently: Take 8 mg by mouth every 8 (eight) hours as needed for nausea or vomiting. 05/04/24 05/04/25 Yes Alphonsa Glendia LABOR, MD  oxyCODONE  (ROXICODONE ) 15 MG immediate release tablet Take 1 tablet (15 mg total) by mouth every 4 (four) hours as needed for pain 05/07/24  Yes   OXYGEN  Inhale 5 L into the lungs continuous.   Yes  [provider]  pantoprazole  (PROTONIX ) 40 MG tablet Take 1 tablet (40 mg total) by mouth 2 (two) times daily. 03/09/24  Yes Luking, Glendia LABOR, MD  revefenacin  (YUPELRI ) 175 MCG/3ML nebulizer solution Take 3 mLs (175 mcg total) by nebulization daily. 02/24/24  Yes Angiulli, Toribio PARAS, PA-C  rosuvastatin  (CRESTOR ) 20 MG tablet Take 1 tablet (20 mg total) by mouth daily. 03/09/24  Yes Luking, Scott A, MD  senna-docusate (SENOKOT-S) 8.6-50 MG tablet Take 2 tablets by mouth at bedtime. 01/23/24  Yes Pearlean Manus, MD  topiramate  (TOPAMAX ) 50 MG tablet Take 1 tablet (50 mg total) by mouth daily with lunch. 03/17/24  Yes Nida, Gebreselassie W, MD  Vitamin D , Ergocalciferol , (DRISDOL ) 1.25 MG (50000 UNIT) CAPS capsule Take 1 capsule (50,000 Units total) by mouth every Sunday. 03/01/24  Yes Angiulli, Toribio PARAS, PA-C  DULoxetine  (CYMBALTA ) 60 MG capsule Take 1 capsule (60 mg total) by mouth daily. Patient not taking: Reported on 05/11/2024 03/09/24   Alphonsa Glendia LABOR, MD  polyethylene glycol (MIRALAX  / GLYCOLAX ) 17 g packet Take 17 g by mouth 2 (two) times daily. 01/23/24   Pearlean Manus, MD    Physical Exam: Vitals:   05/11/24 1800 05/11/24 1814 05/11/24 1815 05/11/24 1816  BP: (!) 109/51     Pulse: 90 93 91 90  Resp: 14 18 (!) 22 17  Temp:      TempSrc:      SpO2: 97% 96% 95% 96%  Weight:      Height:        Constitutional: Sleeping but arousable, calm, comfortable Vitals:   05/11/24 1800 05/11/24 1814 05/11/24 1815 05/11/24 1816  BP: (!) 109/51     Pulse: 90 93 91 90  Resp: 14 18 (!) 22 17  Temp:      TempSrc:      SpO2: 97% 96% 95% 96%  Weight:      Height:       Eyes: PERRL, lids and conjunctivae normal ENMT: Mucous membranes are moist..  Neck: normal, supple, no masses, no thyromegaly Respiratory: clear to auscultation bilaterally, no wheezing, no crackles. Normal respiratory effort. No accessory muscle use.  Cardiovascular: Regular rate and rhythm, no murmurs / rubs / gallops.   At least 2+ pitting bilateral lower extremity edema to knees, extremities warm. Abdomen: obese, soft, no tenderness, no masses palpated. No hepatosplenomegaly. Bowel sounds positive.  Musculoskeletal: no clubbing / cyanosis. No joint deformity upper and  lower extremities.  Skin: no rashes, lesions, ulcers. No induration Neurologic: No facial asymmetry, moves extremity spontaneously, speech fluent.  4.  Psychiatric: Normal judgment and insight. Alert and oriented x 3. Normal mood.   Labs on Admission: I have personally reviewed following labs and imaging studies  CBC: Recent Labs  Lab 05/11/24 1601  WBC 9.2  NEUTROABS 6.8  HGB 8.6*  HCT 29.4*  MCV 92.2  PLT 456*   Basic Metabolic Panel: Recent Labs  Lab 05/11/24 1601  NA 142  K 3.6  CL 102  CO2 29  GLUCOSE 122*  BUN 5*  CREATININE 0.70  CALCIUM  8.7*   GFR: Estimated Creatinine Clearance: 105.7 mL/min (by C-G formula based on SCr of 0.7 mg/dL). Liver Function Tests: Recent Labs  Lab 05/11/24 1601  AST 22  ALT 19  ALKPHOS 64  BILITOT 0.5  PROT 5.9*  ALBUMIN 2.8*   Recent Labs  Lab 05/11/24 1601  LIPASE 26   Urine analysis:    Component Value Date/Time   COLORURINE COLORLESS (A) 05/11/2024 1755   APPEARANCEUR CLEAR 05/11/2024 1755   APPEARANCEUR Clear 05/05/2024 1456   LABSPEC 1.004 (L) 05/11/2024 1755   PHURINE 7.0 05/11/2024 1755   GLUCOSEU NEGATIVE 05/11/2024 1755   GLUCOSEU NEGATIVE 05/29/2010 1622   HGBUR NEGATIVE 05/11/2024 1755   BILIRUBINUR NEGATIVE 05/11/2024 1755   BILIRUBINUR Negative 05/05/2024 1456   KETONESUR NEGATIVE 05/11/2024 1755   PROTEINUR NEGATIVE 05/11/2024 1755   UROBILINOGEN 1.0 04/30/2020 0831   UROBILINOGEN 0.2 06/16/2014 0942   NITRITE NEGATIVE 05/11/2024 1755   LEUKOCYTESUR NEGATIVE 05/11/2024 1755    Radiological Exams on Admission: DG Chest Portable 1 View Result Date: 05/11/2024 CLINICAL DATA:  Bilateral lower extremity and abdominal swelling. History of CHF. EXAM:  PORTABLE CHEST 1 VIEW COMPARISON:  05/01/2024. FINDINGS: The heart size and mediastinal contours are unchanged. No significant change in pulmonary vascular congestion. Similar small left pleural effusion with left basilar opacity. No pneumothorax. No acute osseous abnormality. IMPRESSION: 1. Similar small left pleural effusion with left basilar opacity. 2. Similar pulmonary vascular congestion. Electronically Signed   By: Harrietta Sherry M.D.   On: 05/11/2024 16:17   EKG: Independently reviewed.  Sinus rhythm, rate 93, QTc 473.  No significant change from prior.  Assessment/Plan Principal Problem:   Acute on chronic diastolic CHF (congestive heart failure) (HCC) Active Problems:   Bilateral lower extremity edema   Interstitial lung disease (HCC)   Chronic respiratory failure with hypoxia (HCC)   Anxiety and depression   Obstructive sleep apnea   Chronic pain syndrome   Hypothyroidism   Adrenal insufficiency (Addison's disease) (HCC)  Assessment and Plan: * Acute on chronic diastolic CHF (congestive heart failure) (HCC) Presenting with dyspnea, 2+ pitting bilateral lower extremity edema, weight gain per charts 22 pounds since recent hospitalization.  X-ray shows pulmonary vascular congestion.  BNP-53.  Likely not reflective of volume status due to habitus.  No increased O2 requirements.  Last echo 04/24/2024 EF of 60 to 65%, grade 1 DD.  Reports compliance with Lasix  20 mg daily.  Recent hospitalization for similar. -IV Lasix  80 mg Given, Continue 40 q12h - Strict input output Daily weights   Chronic respiratory failure with hypoxia (HCC) On 5 L at baseline, O2 sats currently 90 to 99%.  Interstitial lung disease (HCC) History of pulmonary hypertension, sarcoidosis, on chronic 5 L O2 at baseline. -125 mg Solu-Medrol  given in ED of dyspnea. -Further dosing pending clinical course.  Anxiety and depression Resume Cymbalta , buspirone .  Adrenal insufficiency (Addison's disease)  (HCC) Blood pressure stable. -Resume Solu-Cortef , midodrine . -Doubt need for stress dose steroids at this time.  Hypothyroidism Resume Synthroid   Chronic pain syndrome Resume PTA oxycodone  50 mg every 4 hourly as needed  Obstructive sleep apnea Not on CPAP.    DVT prophylaxis: Lovenox  Code Status: FULL code Family Communication: Spouse at bedside Disposition Plan: ~ 2 days Consults called: None  Admission status: INPT tele  I certify that at the point of admission it is my clinical judgment that the patient will require inpatient hospital care spanning beyond 2 midnights from the point of admission due to high intensity of service, high risk for further deterioration and high frequency of surveillance required.   Author: Tully FORBES Carwin, MD 05/11/2024 8:09 PM  For on call review [email or web address].

## 2024-05-11 NOTE — Assessment & Plan Note (Signed)
 Resume Synthroid 

## 2024-05-11 NOTE — Assessment & Plan Note (Deleted)
 Presenting with dyspnea, 2+ pitting bilateral lower extremity edema, weight gain per charts 22 pounds since recent hospitalization.  X-ray shows pulmonary vascular congestion.  BNP-53.  Likely not reflective of volume status due to habitus.  No increased O2 requirements.  Last echo 04/24/2024 EF of 60 to 65%, grade 1 DD.  Reports compliance with Lasix  20 mg daily.  Recent hospitalization for similar. -IV Lasix  80 mg Given, Continue 40 q12h - Strict input output Daily weights

## 2024-05-11 NOTE — ED Triage Notes (Signed)
 Pt with significant swelling to lower legs and abd. Pt with hx of CHF and on home O2 at 5 L/M.  Reported that pt was seen by pulmonologist at [facility] and was made aware of weight gain and swelling.

## 2024-05-11 NOTE — Assessment & Plan Note (Signed)
 Blood pressure stable. -Resume Solu-Cortef , midodrine . -Doubt need for stress dose steroids at this time.

## 2024-05-11 NOTE — ED Provider Notes (Signed)
  EMERGENCY DEPARTMENT AT [facility] Provider Note  CSN: [id number] Arrival date & time: 05/11/24 1532  Chief Complaint(s) Edema  HPI Kathleen [name] is a 63 y.o. female with past medical history as below, significant for Interstitial lung disease, sarcoidosis, chronic respiratory failure on 5 L nasal cannula, morbid obesity, Addison's disease, urinary retention who presents to the ED with complaint of fluid retention/edema  She was admitted 04/23/2024 with urinary retention and lower extremity edema, left pleural effusion she was discharged on 9/5.  She was seen by her pulmonology specialist today [facility] and also had follow-up phone call with urology regarding recent urine culture.  Patient here today secondary to fluid retention.  She reports she is compliant with her Lasix  but does not feel as though with the oral version is working, she performs In-N-Out cath but does not feel as though she is producing adequate urine.  Reports she has gained significant weight over the past week but is unsure what the number is.  She feels like her breathing has worsened over the past week but she is breathing comfortably on her typical home oxygen . She however does have dyspnea when lying flat or ambulating. No fevers or chills, nausea or vomiting, no chest pain.  She feels that her abdomen is tight but no discrete abdominal pain.  No change to bowel movements, no brbpr or melena; but reports reduced p.o. intake because she feels that she is very full.   Seen by pulmonology earlier today, patient reports that she was directed to go to the ER for diuresis    Past Medical History Past Medical History:  Diagnosis Date   Adrenal insufficiency (HCC)    Anemia    Anxiety    Avascular bone necrosis (HCC)    Breast fibrocystic disorder    Chronic pain    Encounter for long-term opiate analgesic use 02/02/2020   Patient has chronic pain with femoral head avascular necrosis.  Is under pain  contract   Fibromyalgia    Gastroesophageal reflux disease    Hypothyroidism    Mitral valve prolapse    Orthostatic hypotension    Peripheral neuropathy    PONV (postoperative nausea and vomiting)    Restrictive lung disease    Moderate to severe   Sarcoidosis    Biopsy proven - [facility]   Sarcoidosis    SOB (shortness of breath)    chronic   Patient Active Problem List   Diagnosis Date Noted   Acute on chronic congestive heart failure (HCC) 05/11/2024   UTI (urinary tract infection) 04/23/2024   Osteoporosis with current pathological fracture 03/17/2024   Anxiety 02/11/2024   Spinal compression fracture (HCC) 01/29/2024   Lower back pain 01/26/2024   Closed compression fracture of L3 lumbar vertebra, initial encounter (HCC) 01/21/2024   Intractable low back pain 01/03/2024   Ambulatory dysfunction 12/11/2023   Uncontrolled pain 12/08/2023   Pressure injury of skin 12/08/2023   Intractable back pain 12/07/2023   Prediabetes 10/28/2023   Ankle fracture, bimalleolar, closed, left, sequela 03/10/2023   Closed fracture of left ankle 03/10/2023   Syndesmotic disruption of ankle, left, initial encounter 03/10/2023   Lumbar transverse process fracture (HCC) 01/02/2023   Acute on chronic hypoxic respiratory failure (HCC) 12/13/2022   Depression, major, single episode, moderate (HCC) 09/10/2022   Aortic atherosclerosis (HCC) 09/10/2022   Chronic respiratory failure with hypoxia (HCC) 05/22/2021   Anxiety and depression 05/22/2021   Vitamin D  deficiency 05/09/2021   Left foot drop  01/09/2021   Pulmonary HTN (HCC) 10/12/2020   Encounter for long-term opiate analgesic use 02/02/2020   Interstitial lung disease (HCC) 04/14/2019   GERD (gastroesophageal reflux disease) 10/15/2017   Personal history of noncompliance with medical treatment, presenting hazards to health 04/26/2017   Hypothyroidism 01/15/2017   Adrenal insufficiency (Addison's disease) (HCC) 08/22/2016   Symptomatic  bradycardia    Adrenal insufficiency (HCC) 08/17/2016   Hypokalemia 08/17/2016   Chronic pain syndrome 04/20/2016   Irritable bowel syndrome with constipation 11/14/2015   Avascular necrosis of bone of hip, left (HCC) 04/05/2015   Post herpetic neuralgia 10/22/2014   Mixed hyperlipidemia 08/25/2014   Obstructive sleep apnea 02/18/2014   Fibromyalgia 02/11/2013   Bilateral lower extremity edema 01/22/2013   Hypotension 01/16/2013   Fibrocystic breast disease in female 04/09/2012   Sarcoidosis 06/21/2008   Home Medication(s) Prior to Admission medications   Medication Sig Start Date End Date Taking? Authorizing Provider  acetaminophen  (TYLENOL ) 325 MG tablet Take 2 tablets (650 mg total) by mouth every 6 (six) hours as needed for mild pain (pain score 1-3) or fever (or Fever >/= 101). 02/19/24  Yes Angiulli, Toribio PARAS, PA-C  alendronate  (FOSAMAX ) 70 MG tablet Take 1 tablet (70 mg total) by mouth every Saturday. Take with a full glass of water  on an empty stomach. 03/14/24  Yes Alphonsa Glendia LABOR, MD  amoxicillin  (AMOXIL ) 875 MG tablet Take 1 tablet (875 mg total) by mouth every 12 (twelve) hours. 05/11/24  Yes Dahlstedt, Garnette, MD  busPIRone  (BUSPAR ) 15 MG tablet Take 1 tablet (15 mg) by mouth 3 times daily. 03/09/24  Yes Alphonsa Glendia LABOR, MD  Calcium  Citrate 333 MG TABS Take one tablet by mouth 3 (three) times daily with meals. 03/17/24  Yes Nida, Ethelle ORN, MD  ferrous sulfate  (FEROSUL) 325 (65 FE) MG tablet Take 1 tablet (325 mg total) by mouth once a week. Patient taking differently: Take 325 mg by mouth every Friday. 04/17/24  Yes Alphonsa Glendia LABOR, MD  folic acid  (FOLVITE ) 1 MG tablet Take 1 tablet (1 mg total) by mouth daily. 05/04/24  Yes Luking, Glendia LABOR, MD  furosemide  (LASIX ) 20 MG tablet Take 1 tablet (20 mg total) by mouth daily. 05/02/24 06/02/24 Yes Shahmehdi, Adriana LABOR, MD  hydrocortisone  (CORTEF ) 10 MG tablet Take 2 tablets by mouth at 8:00 am and 1 tablet at lunch. (May double dose on  sick days x 3-5 days) 11/14/23  Yes Nida, Ethelle ORN, MD  levothyroxine  (SYNTHROID ) 50 MCG tablet Take 1 tablet by mouth daily before breakfast. 03/09/24 09/05/24 Yes Luking, Glendia LABOR, MD  linaclotide  (LINZESS ) 145 MCG CAPS capsule Take 1 capsule (145 mcg total) by mouth daily before breakfast. 03/09/24  Yes Luking, Glendia LABOR, MD  midodrine  (PROAMATINE ) 5 MG tablet Take 1 tablet (5 mg total) by mouth 2 (two) times daily with a meal. 03/09/24  Yes Luking, Glendia LABOR, MD  ondansetron  (ZOFRAN -ODT) 8 MG disintegrating tablet Dissolve 1 tablet (8 mg total) by mouth every 8 (eight) hours as needed. Patient taking differently: Take 8 mg by mouth every 8 (eight) hours as needed for nausea or vomiting. 05/04/24 05/04/25 Yes Alphonsa Glendia LABOR, MD  oxyCODONE  (ROXICODONE ) 15 MG immediate release tablet Take 1 tablet (15 mg total) by mouth every 4 (four) hours as needed for pain 05/07/24  Yes   OXYGEN  Inhale 5 L into the lungs continuous.   Yes [provider]  pantoprazole  (PROTONIX ) 40 MG tablet Take 1 tablet (40 mg total) by mouth 2 (  two) times daily. 03/09/24  Yes Alphonsa Glendia LABOR, MD  revefenacin  (YUPELRI ) 175 MCG/3ML nebulizer solution Take 3 mLs (175 mcg total) by nebulization daily. 02/24/24  Yes Angiulli, Toribio PARAS, PA-C  rosuvastatin  (CRESTOR ) 20 MG tablet Take 1 tablet (20 mg total) by mouth daily. 03/09/24  Yes Luking, Scott A, MD  senna-docusate (SENOKOT-S) 8.6-50 MG tablet Take 2 tablets by mouth at bedtime. 01/23/24  Yes Pearlean Manus, MD  topiramate  (TOPAMAX ) 50 MG tablet Take 1 tablet (50 mg total) by mouth daily with lunch. 03/17/24  Yes Nida, Ethelle ORN, MD  Vitamin D , Ergocalciferol , (DRISDOL ) 1.25 MG (50000 UNIT) CAPS capsule Take 1 capsule (50,000 Units total) by mouth every Sunday. 03/01/24  Yes Angiulli, Toribio PARAS, PA-C  DULoxetine  (CYMBALTA ) 60 MG capsule Take 1 capsule (60 mg total) by mouth daily. Patient not taking: Reported on 05/11/2024 03/09/24   Alphonsa Glendia LABOR, MD  polyethylene glycol  (MIRALAX  / GLYCOLAX ) 17 g packet Take 17 g by mouth 2 (two) times daily. 01/23/24   Pearlean Manus, MD                                                                                                                                    Past Surgical History Past Surgical History:  Procedure Laterality Date   BIOPSY  10/17/2018   Procedure: BIOPSY;  Surgeon: Golda Claudis PENNER, MD;  Location: [facility];  Service: Endoscopy;;  duodenum, antrum, gastric body   BIOPSY  03/03/2021   Procedure: BIOPSY;  Surgeon: Golda Claudis PENNER, MD;  Location: [facility];  Service: Endoscopy;;   BIOPSY  09/11/2023   Procedure: BIOPSY;  Surgeon: Shaaron Lamar HERO, MD;  Location: [facility];  Service: Endoscopy;;   BREAST LUMPECTOMY Bilateral 01/12/2011   CHOLECYSTECTOMY N/A 04/17/2021   Procedure: LAPAROSCOPIC CHOLECYSTECTOMY;  Surgeon: Mavis Anes, MD;  Location: [facility];  Service: General;  Laterality: N/A;   COLONOSCOPY WITH PROPOFOL  N/A 01/03/2022   Procedure: COLONOSCOPY WITH PROPOFOL ;  Surgeon: Golda Claudis PENNER, MD;  [facility]: [facility];  Service: Endoscopy;  Laterality: N/A;  220   ESOPHAGEAL DILATION N/A 03/03/2021   Procedure: ESOPHAGEAL DILATION;  Surgeon: Golda Claudis PENNER, MD;  [facility]: [facility];  Service: Endoscopy;  Laterality: N/A;   ESOPHAGOGASTRODUODENOSCOPY (EGD) WITH PROPOFOL  N/A 10/17/2018   Procedure: ESOPHAGOGASTRODUODENOSCOPY (EGD) WITH PROPOFOL ;  Surgeon: Golda Claudis PENNER, MD;  [facility]: [facility];  Service: Endoscopy;  Laterality: N/A;  2:25   ESOPHAGOGASTRODUODENOSCOPY (EGD) WITH PROPOFOL  N/A 03/03/2021   Procedure: ESOPHAGOGASTRODUODENOSCOPY (EGD) WITH PROPOFOL ;  Surgeon: Golda Claudis PENNER, MD;  [facility]: [facility];  Service: Endoscopy;  Laterality: N/A;  12:15   ESOPHAGOGASTRODUODENOSCOPY (EGD) WITH PROPOFOL  N/A 09/11/2023   Procedure: ESOPHAGOGASTRODUODENOSCOPY (EGD) WITH PROPOFOL ;  Surgeon: Shaaron Lamar HERO, MD;  [facility];  Service: Endoscopy;   Laterality: N/A;  2:30 pm, asa 3, LM to see if pt can come earlier  MALONEY DILATION N/A 09/11/2023   Procedure: AGAPITO DILATION;  Surgeon: Shaaron Lamar HERO, MD;  [facility]: [facility];  Service: Endoscopy;  Laterality: N/A;   ORIF ANKLE FRACTURE Left 03/10/2023   Procedure: OPEN REDUCTION INTERNAL FIXATION (ORIF) ANKLE FRACTURE;  Surgeon: Georgina Ozell LABOR, MD;  Location: [facility];  Service: Orthopedics;  Laterality: Left;   POLYPECTOMY  01/03/2022   Procedure: POLYPECTOMY INTESTINAL;  Surgeon: Golda Claudis PENNER, MD;  [facility];  Service: Endoscopy;;   TUBAL LIGATION     Family History Family History  Problem Relation Age of Onset   Cardiomyopathy Mother    Breast cancer Mother    Prostate cancer Father    Heart attack Maternal Grandmother    Heart attack Maternal Grandfather    Heart attack Paternal Grandmother    Bipolar disorder Daughter    Heart disease Maternal Aunt    Neurofibromatosis Cousin    Colon cancer Neg Hx     Social History Social History   Tobacco Use   Smoking status: Never    Passive exposure: Never   Smokeless tobacco: Never  Vaping Use   Vaping status: Never Used  Substance Use Topics   Alcohol use: No   Drug use: No   Allergies Bee venom, Contrast media [iodinated contrast media], Bactrim  [sulfamethoxazole -trimethoprim ], and Codeine  Review of Systems A thorough review of systems was obtained and all systems are negative except as noted in the HPI and PMH.   Physical Exam Vital Signs  I have reviewed the triage vital signs BP (!) 123/49 (BP Location: Left Arm)   Pulse 93   Temp 98.6 F (37 C) (Oral)   Resp 18   Ht 5' 10 (1.778 m)   Wt 123.9 kg   LMP 05/17/2016   SpO2 93%   BMI 39.19 kg/m  Physical Exam Vitals and nursing note reviewed.  Constitutional:      General: She is not in acute distress.    Appearance: Normal appearance. She is obese.  HENT:     Head: Normocephalic and atraumatic.     Right Ear: External ear  normal.     Left Ear: External ear normal.     Nose: Nose normal.     Mouth/Throat:     Mouth: Mucous membranes are moist.  Eyes:     General: No scleral icterus.       Right eye: No discharge.        Left eye: No discharge.  Cardiovascular:     Rate and Rhythm: Normal rate and regular rhythm.     Pulses: Normal pulses.     Heart sounds: Normal heart sounds.  Pulmonary:     Effort: Pulmonary effort is normal. Tachypnea present. No respiratory distress.     Breath sounds: Transmitted upper airway sounds present. No stridor.     Comments: Coarse bilateral Abdominal:     General: Abdomen is flat. There is distension.     Palpations: Abdomen is soft.     Tenderness: There is no abdominal tenderness.  Musculoskeletal:     Cervical back: No rigidity.     Right lower leg: Edema present.     Left lower leg: Edema present.  Skin:    General: Skin is warm and dry.     Capillary Refill: Capillary refill takes less than 2 seconds.  Neurological:     Mental Status: She is alert.  Psychiatric:        Mood and Affect: Mood normal.  Behavior: Behavior normal. Behavior is cooperative.     ED Results and Treatments Labs (all labs ordered are listed, but only abnormal results are displayed) Labs Reviewed  CBC WITH DIFFERENTIAL/PLATELET - Abnormal; Notable for the following components:      Result Value   RBC 3.19 (*)    Hemoglobin 8.6 (*)    HCT 29.4 (*)    MCHC 29.3 (*)    RDW 17.2 (*)    Platelets 456 (*)    Abs Immature Granulocytes 0.10 (*)    All other components within normal limits  COMPREHENSIVE METABOLIC PANEL WITH GFR - Abnormal; Notable for the following components:   Glucose, Bld 122 (*)    BUN 5 (*)    Calcium  8.7 (*)    Total Protein 5.9 (*)    Albumin 2.8 (*)    All other components within normal limits  URINALYSIS, ROUTINE W REFLEX MICROSCOPIC - Abnormal; Notable for the following components:   Color, Urine COLORLESS (*)    Specific Gravity, Urine 1.004  (*)    All other components within normal limits  RESP PANEL BY RT-PCR (RSV, FLU A&B, COVID)  RVPGX2  LIPASE, BLOOD  BRAIN NATRIURETIC PEPTIDE  TROPONIN I (HIGH SENSITIVITY)  TROPONIN I (HIGH SENSITIVITY)                                                                                                                          Radiology DG Chest Portable 1 View Result Date: 05/11/2024 CLINICAL DATA:  Bilateral lower extremity and abdominal swelling. History of CHF. EXAM: PORTABLE CHEST 1 VIEW COMPARISON:  05/01/2024. FINDINGS: The heart size and mediastinal contours are unchanged. No significant change in pulmonary vascular congestion. Similar small left pleural effusion with left basilar opacity. No pneumothorax. No acute osseous abnormality. IMPRESSION: 1. Similar small left pleural effusion with left basilar opacity. 2. Similar pulmonary vascular congestion. Electronically Signed   By: Harrietta Sherry M.D.   On: 05/11/2024 16:17    Pertinent labs & imaging results that were available during my care of the patient were reviewed by me and considered in my medical decision making (see MDM for details).  Medications Ordered in ED Medications  furosemide  (LASIX ) injection 80 mg (80 mg Intravenous Given 05/11/24 1719)  LORazepam  (ATIVAN ) tablet 2 mg (2 mg Oral Given 05/11/24 1742)  midodrine  (PROAMATINE ) tablet 5 mg (5 mg Oral Given 05/11/24 1742)  methylPREDNISolone  sodium succinate  (SOLU-MEDROL ) 125 mg/2 mL injection 125 mg (125 mg Intravenous Given 05/11/24 1821)  Procedures .Critical Care  Performed by: Elnor Jayson LABOR, DO Authorized by: Elnor Jayson LABOR, DO   Critical care provider statement:    Critical care time (minutes):  30   Critical care time was exclusive of:  Separately billable procedures and treating other patients   Critical care was necessary to  treat or prevent imminent or life-threatening deterioration of the following conditions:  Cardiac failure (acute CHF)   Critical care was time spent personally by me on the following activities:  Development of treatment plan with patient or surrogate, discussions with consultants, evaluation of patient's response to treatment, examination of patient, ordering and review of laboratory studies, ordering and review of radiographic studies, ordering and performing treatments and interventions, pulse oximetry, re-evaluation of patient's condition, review of old charts and obtaining history from patient or surrogate   Care discussed with: admitting provider     (including critical care time)  Medical Decision Making / ED Course    Medical Decision Making:    Kathleen [name] is a 63 y.o. female with past medical history as below, significant for Interstitial lung disease, sarcoidosis, chronic respiratory failure on 5 L nasal cannula, morbid obesity, Addison's disease, urinary retention who presents to the ED with complaint of fluid retention/edema. The complaint involves an extensive differential diagnosis and also carries with it a high risk of complications and morbidity.  Serious etiology was considered. Ddx includes but is not limited to: Acute CHF, nephrotic syndrome, pulmonary edema, viral infection, medication nonadherence, fluid restriction nonadherence, dietary nonadherence, etc.  Complete initial physical exam performed, notably the patient was in no acute distress, no hypoxia, she is on her typical 5 L nasal cannula Reviewed and confirmed nursing documentation for past medical history, family history, social history.  Vital signs reviewed.    Fluid overload Decompensated CHF Anasarca > - History of CHF, recent admission for the same.  Patient reports that she feels nearly identical to how she felt when she was recently admitted. - Echocardiogram 04/24/2024 with LVEF 60-65%, G1 DD - Abdomen  is distended, she has 3+ pitting lower extremity edema, appears overtly volume overloaded - labs stable, cxr stable (small pleural eff stable)  Clinical Course as of 05/11/24 1927  Mon May 11, 2024  1739 B Natriuretic Peptide: 53.0 BNP wnl but possibly inaccurate given her habitus, she appears overtly fluid overloaded on exam [SG]  1741 Hemoglobin(!): 8.6 Similar to prior [SG]  1741 Hx ILD, worsening exertional dyspnea, give solumedrol [SG]  1743 Weight: 123.4 kg Pt has gained 25 kg since last admit [SG]    Clinical Course User Index [SG] Elnor Jayson A, DO     Recommend admission for acute chf, anasarca; pt agreeable. Spoke w/ Dr Pearlean who accepts pt for admit                 Additional history obtained: -Additional history obtained from na -External records from outside source obtained and reviewed including: Chart review including previous notes, labs, imaging, consultation notes including  Recent admit Prior echo Prior labs    Lab Tests: -I ordered, reviewed, and interpreted labs.   The pertinent results include:   Labs Reviewed  CBC WITH DIFFERENTIAL/PLATELET - Abnormal; Notable for the following components:      Result Value   RBC 3.19 (*)    Hemoglobin 8.6 (*)    HCT 29.4 (*)    MCHC 29.3 (*)    RDW 17.2 (*)    Platelets 456 (*)    Abs  Immature Granulocytes 0.10 (*)    All other components within normal limits  COMPREHENSIVE METABOLIC PANEL WITH GFR - Abnormal; Notable for the following components:   Glucose, Bld 122 (*)    BUN 5 (*)    Calcium  8.7 (*)    Total Protein 5.9 (*)    Albumin 2.8 (*)    All other components within normal limits  URINALYSIS, ROUTINE W REFLEX MICROSCOPIC - Abnormal; Notable for the following components:   Color, Urine COLORLESS (*)    Specific Gravity, Urine 1.004 (*)    All other components within normal limits  RESP PANEL BY RT-PCR (RSV, FLU A&B, COVID)  RVPGX2  LIPASE, BLOOD  BRAIN NATRIURETIC PEPTIDE   TROPONIN I (HIGH SENSITIVITY)  TROPONIN I (HIGH SENSITIVITY)    Notable for labs stable  EKG   EKG Interpretation Date/Time:  Monday May 11 2024 15:47:51 EDT Ventricular Rate:  93 PR Interval:    QRS Duration:  81 QT Interval:  380 QTC Calculation: 473 R Axis:   52  Text Interpretation: Low voltage, precordial leads Abnormal R-wave progression, early transition Borderline T wave abnormalities  Interpretation limited secondary to artifact  Confirmed by Elnor Savant ([id number]) on 05/11/2024 4:07:24 PM         Imaging Studies ordered: I ordered imaging studies including CXR I independently visualized the following imaging with scope of interpretation limited to determining acute life threatening conditions related to emergency care; findings noted above I agree with the radiologist interpretation If any imaging was obtained with contrast I closely monitored patient for any possible adverse reaction a/w contrast administration in the emergency department   Medicines ordered and prescription drug management: Meds ordered this encounter  Medications   furosemide  (LASIX ) injection 80 mg   LORazepam  (ATIVAN ) tablet 2 mg   midodrine  (PROAMATINE ) tablet 5 mg   methylPREDNISolone  sodium succinate  (SOLU-MEDROL ) 125 mg/2 mL injection 125 mg    -I have reviewed the patients home medicines and have made adjustments as needed   Consultations Obtained: I requested consultation with the [facility],  and discussed lab and imaging findings as well as pertinent plan    Cardiac Monitoring: The patient was maintained on a cardiac monitor.  I personally viewed and interpreted the cardiac monitored which showed an underlying rhythm of: NSR Continuous pulse oximetry interpreted by myself, 95% on 5L.    Social Determinants of Health:  Diagnosis or treatment significantly limited by social determinants of health: obesity   Reevaluation: After the interventions noted above, I reevaluated the  patient and found that they have improved  Co morbidities that complicate the patient evaluation  Past Medical History:  Diagnosis Date   Adrenal insufficiency (HCC)    Anemia    Anxiety    Avascular bone necrosis (HCC)    Breast fibrocystic disorder    Chronic pain    Encounter for long-term opiate analgesic use 02/02/2020   Patient has chronic pain with femoral head avascular necrosis.  Is under pain contract   Fibromyalgia    Gastroesophageal reflux disease    Hypothyroidism    Mitral valve prolapse    Orthostatic hypotension    Peripheral neuropathy    PONV (postoperative nausea and vomiting)    Restrictive lung disease    Moderate to severe   Sarcoidosis    Biopsy proven - [facility]   Sarcoidosis    SOB (shortness of breath)    chronic      Dispostion: Disposition decision including need for hospitalization was considered, and  patient admitted to the hospital.    Final Clinical Impression(s) / ED Diagnoses Final diagnoses:  Urinary retention  Anasarca  Exertional dyspnea  History of interstitial lung disease        Elnor Jayson LABOR, DO 05/11/24 1927

## 2024-05-11 NOTE — Progress Notes (Signed)
 Earlier in evening, patient was brought up to the floor.  As I was cleaning a CPAP machine, patient was asking the NT what the liter flow was on her O2.  Patient was on 4L, but kept repeating that she was supposed to be on 5L.  I went into room and put her on the dinamap to check her O2 sat, and she was 96% on the 4L.  I explained to patient that she did not need to be put on 5L when her sat was good on 4.  I told patient to keep taking deep breaths in through her nose and out her mouth to help her calm down.  Patient did not keep repeating what she had before as I left room.  NT finished her vitals on the patient.

## 2024-05-11 NOTE — Assessment & Plan Note (Signed)
 Presenting with dyspnea, 2+ pitting bilateral lower extremity edema, weight gain per charts 22 pounds since recent hospitalization.  X-ray shows pulmonary vascular congestion.  BNP-53.  Likely not reflective of volume status due to habitus.  No increased O2 requirements.  Last echo 04/24/2024 EF of 60 to 65%, grade 1 DD.  Reports compliance with Lasix  20 mg daily.  Recent hospitalization for similar. -IV Lasix  80 mg Given, Continue 40 q12h - Strict input output Daily weights

## 2024-05-11 NOTE — Assessment & Plan Note (Signed)
 History of pulmonary hypertension, sarcoidosis, on chronic 5 L O2 at baseline. -125 mg Solu-Medrol  given in ED of dyspnea. -Further dosing pending clinical course.

## 2024-05-11 NOTE — Assessment & Plan Note (Signed)
 Resume PTA oxycodone  50 mg every 4 hourly as needed

## 2024-05-11 NOTE — Assessment & Plan Note (Signed)
 On 5 L at baseline, O2 sats currently 90 to 99%.

## 2024-05-12 ENCOUNTER — Inpatient Hospital Stay (HOSPITAL_COMMUNITY)

## 2024-05-12 DIAGNOSIS — I5033 Acute on chronic diastolic (congestive) heart failure: Secondary | ICD-10-CM | POA: Diagnosis not present

## 2024-05-12 DIAGNOSIS — R079 Chest pain, unspecified: Secondary | ICD-10-CM

## 2024-05-12 DIAGNOSIS — J849 Interstitial pulmonary disease, unspecified: Secondary | ICD-10-CM | POA: Diagnosis not present

## 2024-05-12 DIAGNOSIS — J9611 Chronic respiratory failure with hypoxia: Secondary | ICD-10-CM | POA: Diagnosis not present

## 2024-05-12 DIAGNOSIS — F419 Anxiety disorder, unspecified: Secondary | ICD-10-CM | POA: Diagnosis not present

## 2024-05-12 DIAGNOSIS — G4733 Obstructive sleep apnea (adult) (pediatric): Secondary | ICD-10-CM

## 2024-05-12 LAB — BASIC METABOLIC PANEL WITH GFR
Anion gap: 11 (ref 5–15)
BUN: 8 mg/dL (ref 8–23)
CO2: 31 mmol/L (ref 22–32)
Calcium: 8.7 mg/dL — ABNORMAL LOW (ref 8.9–10.3)
Chloride: 101 mmol/L (ref 98–111)
Creatinine, Ser: 0.68 mg/dL (ref 0.44–1.00)
GFR, Estimated: >60 mL/min (ref >60)
Glucose, Bld: 190 mg/dL — ABNORMAL HIGH (ref 70–99)
Potassium: 4.7 mmol/L (ref 3.5–5.1)
Sodium: 143 mmol/L (ref 135–145)

## 2024-05-12 LAB — CBC
HCT: 30.6 % — ABNORMAL LOW (ref 36.0–46.0)
Hemoglobin: 9 g/dL — ABNORMAL LOW (ref 12.0–15.0)
MCH: 26.6 pg (ref 26.0–34.0)
MCHC: 29.4 g/dL — ABNORMAL LOW (ref 30.0–36.0)
MCV: 90.5 fL (ref 80.0–100.0)
Platelets: 459 K/uL — ABNORMAL HIGH (ref 150–400)
RBC: 3.38 MIL/uL — ABNORMAL LOW (ref 3.87–5.11)
RDW: 17.1 % — ABNORMAL HIGH (ref 11.5–15.5)
WBC: 8.4 K/uL (ref 4.0–10.5)
nRBC: 0 % (ref 0.0–0.2)

## 2024-05-12 LAB — TROPONIN I (HIGH SENSITIVITY): Troponin I (High Sensitivity): 3 ng/L (ref <18)

## 2024-05-12 LAB — MAGNESIUM: Magnesium: 2.2 mg/dL (ref 1.7–2.4)

## 2024-05-12 LAB — GLUCOSE, CAPILLARY: Glucose-Capillary: 186 mg/dL — ABNORMAL HIGH (ref 70–99)

## 2024-05-12 MED ORDER — HYDROMORPHONE HCL 1 MG/ML IJ SOLN
1.0000 mg | INTRAMUSCULAR | Status: DC | PRN
  Administered 2024-05-13 – 2024-05-14 (×9): 1 mg via INTRAVENOUS
  Filled 2024-05-12 (×9): qty 1

## 2024-05-12 MED ORDER — MORPHINE SULFATE (PF) 2 MG/ML IV SOLN: 2.0000 mg | INTRAVENOUS | Status: DC | PRN

## 2024-05-12 MED ORDER — REVEFENACIN 175 MCG/3ML IN SOLN
175.0000 ug | Freq: Every day | RESPIRATORY_TRACT | Status: DC
Start: 2024-05-12 — End: 2024-05-23
  Administered 2024-05-12 – 2024-05-23 (×12): 175 ug via RESPIRATORY_TRACT
  Filled 2024-05-12 (×12): qty 3

## 2024-05-12 MED ORDER — KETOROLAC TROMETHAMINE 15 MG/ML IJ SOLN
15.0000 mg | Freq: Four times a day (QID) | INTRAMUSCULAR | Status: AC | PRN
  Administered 2024-05-12 – 2024-05-17 (×4): 15 mg via INTRAVENOUS
  Filled 2024-05-12 (×5): qty 1

## 2024-05-12 MED ORDER — NITROGLYCERIN 0.4 MG SL SUBL: 0.4000 mg | SUBLINGUAL_TABLET | SUBLINGUAL | Status: DC | PRN

## 2024-05-12 MED ORDER — POTASSIUM CHLORIDE 20 MEQ PO PACK
40.0000 meq | PACK | Freq: Once | ORAL | Status: AC
  Administered 2024-05-12: 40 meq via ORAL
  Filled 2024-05-12: qty 2

## 2024-05-12 MED ORDER — ALUM & MAG HYDROXIDE-SIMETH 200-200-20 MG/5ML PO SUSP
15.0000 mL | Freq: Four times a day (QID) | ORAL | Status: DC | PRN
  Administered 2024-05-12 – 2024-05-22 (×11): 15 mL via ORAL
  Filled 2024-05-12 (×14): qty 30

## 2024-05-12 MED ORDER — CHLORHEXIDINE GLUCONATE CLOTH 2 % EX PADS
6.0000 | MEDICATED_PAD | Freq: Every day | CUTANEOUS | Status: DC
  Administered 2024-05-12 – 2024-05-23 (×12): 6 via TOPICAL

## 2024-05-12 MED ORDER — FUROSEMIDE 10 MG/ML IJ SOLN
60.0000 mg | Freq: Two times a day (BID) | INTRAMUSCULAR | Status: DC
  Administered 2024-05-12 – 2024-05-14 (×5): 60 mg via INTRAVENOUS
  Filled 2024-05-12 (×6): qty 6

## 2024-05-12 MED ORDER — ENOXAPARIN SODIUM 60 MG/0.6ML IJ SOSY
60.0000 mg | PREFILLED_SYRINGE | INTRAMUSCULAR | Status: DC
  Administered 2024-05-12 – 2024-05-15 (×4): 60 mg via SUBCUTANEOUS
  Filled 2024-05-12 (×5): qty 0.6

## 2024-05-12 MED ORDER — HYDROXYZINE HCL 25 MG PO TABS
25.0000 mg | ORAL_TABLET | Freq: Three times a day (TID) | ORAL | Status: DC | PRN
  Administered 2024-05-12 – 2024-05-22 (×14): 25 mg via ORAL
  Filled 2024-05-12 (×16): qty 1

## 2024-05-12 NOTE — Consult Note (Addendum)
 Cardiology Consultation   Patient ID: Kathleen [name] MRN: [id number]; DOB: 1961/03/10  Admit date: 05/11/2024 Date of Consult: 05/12/2024  PCP:  Alphonsa Glendia LABOR, MD   [facility] Providers Cardiologist:  None    Patient Profile: Kathleen [name] is a 63 y.o. female with a hx of congestive heart failure, transient bradycardia and PAF in the setting of acute illness, sarcoidosis, chronic respiratory failure on 5 L, adrenal insufficiency, chronic pain. who is being seen 05/12/2024 for the evaluation of HF at the request of Dr. Briana.  History of Present Illness: Chart review indicates prior cardiology evaluation by Dr. Alveta in 2013.  She had negative ischemic testing in the past, no clear documented CAD.   Chart Review notes hospitalized at [facility] in 07/2016 for gastroenteritis gastroenteritis with N/V and poor oral intake which led to adrenal crises with subsequent hypotension.  In the setting of volume resuscitation, she developed hypoxic respiratory failure with volume overload and was also found to be bradycardic with intermittent pauses on telemetry.  She subsequently required support with mechanical ventilation and diuresis as well as pressor support and broad-spectrum antibiotics for possibility of septic shock.  Cardiology was consulted with transient A-fib that ultimately converted to NSR with Amio.  Negative PE on chest CT.  Treated with midodrine  for BP support.  Last seen by heart care in 2018 by [facility] for cardio consult and has been lost to follow-up ever since.  At that time, patient noted essentially back at baseline and still on midodrine .  Reviewed echo from 07/2016 with PASP of 54 mmHg in the setting of acute illness.  Recommended follow-up echo 09/2016 that showed EF 55 to 60%, indeterminate grade DD, mildly dilated RV, trivial TV regurgitation, PASP of 26 mmHg.  Recommended being cautious with standing diuretic at baseline with low normal BP requiring  midodrine  and decompensation.  Noted that transient bradycardia and PAF could have been secondary to sarcoid heart with effect on the conduction system.  Recent hospitalization 8/28 to 05/21/2024 for UTI, urinary retention, bilateral lower extremity edema, left pleural effusion. Echo 8/29 showed EF 60 to 65%, mild LVH, G1 DD, elevated LVEDP, mildly enlarged RV. Treated with antibiotics and diuretics. Discharged on Lasix  20 mg daily.  Presented to [facility] ED 9/15 for fluid retention/edema. Normal labs include K3.6 now 4.7, CR 0.7 now 0.60, BNP 53, TN 5, stable chronic anemia Hgb 9, WBC, negative respiratory viral panel EKG: NSR, HR 93, poor R wave progression, nonspecific T wave abnormalities. (No significant changes compared to 04/23/2024)  CXR showed similar small left pleural effusion with left basilar opacities similar pulmonary vascular congestion Treated with IV Lasix  80 mg x1, IV Lasix  40 mg x1. Net I/O -5605, wt 273 lbs > 263 lbs, Cr 0.7 > 0.68  Also on midodrine  5 mg BID.   On interview, reports ongoing and worsening SOB, chest pain, and swelling (LE, Face, Abdomen) since prior to last hospitalization.  She states it was not resolved upon discharge and she was not given proper treatment. SOB associated with minimal exertion and not relieved with rest.  Chest pain described as sharp/stabbing 10 out of 10, located on the left side, worse with exertion, associated with N/V/dizziness/diaphoresis.  Associated with rest and exertion. Denies any orthopnea, palpitations, syncope.  Lives at home with her husband.  She uses motorized scooter when shopping in grocery store due to CP and SOB.  Denies any tobacco use, drug use, EtOH use. Note daily medication compliance.  Patient is very upset and insist that she must be transferred to [facility] for catheterization per recent pulmonology visit yesterday.  Per chart review, encounter summary with Dr. Gennette at [facility] heart care does not mention catheterization,  however notes pending PFT.  There is note attached to the summary, therefore documentation could be completed this time.   Past Medical History:  Diagnosis Date   Adrenal insufficiency (HCC)    Anemia    Anxiety    Avascular bone necrosis (HCC)    Breast fibrocystic disorder    Chronic pain    Encounter for long-term opiate analgesic use 02/02/2020   Patient has chronic pain with femoral head avascular necrosis.  Is under pain contract   Fibromyalgia    Gastroesophageal reflux disease    Hypothyroidism    Mitral valve prolapse    Orthostatic hypotension    Peripheral neuropathy    PONV (postoperative nausea and vomiting)    Restrictive lung disease    Moderate to severe   Sarcoidosis    Biopsy proven - [facility]   Sarcoidosis    SOB (shortness of breath)    chronic    Past Surgical History:  Procedure Laterality Date   BIOPSY  10/17/2018   Procedure: BIOPSY;  Surgeon: Golda Claudis PENNER, MD;  Location: [facility];  Service: Endoscopy;;  duodenum, antrum, gastric body   BIOPSY  03/03/2021   Procedure: BIOPSY;  Surgeon: Golda Claudis PENNER, MD;  Location: [facility];  Service: Endoscopy;;   BIOPSY  09/11/2023   Procedure: BIOPSY;  Surgeon: Shaaron Lamar HERO, MD;  Location: [facility];  Service: Endoscopy;;   BREAST LUMPECTOMY Bilateral 01/12/2011   CHOLECYSTECTOMY N/A 04/17/2021   Procedure: LAPAROSCOPIC CHOLECYSTECTOMY;  Surgeon: Mavis Anes, MD;  Location: [facility] ORS;  Service: General;  Laterality: N/A;   COLONOSCOPY WITH PROPOFOL  N/A 01/03/2022   Procedure: COLONOSCOPY WITH PROPOFOL ;  Surgeon: Golda Claudis PENNER, MD;  [facility]: [facility];  Service: Endoscopy;  Laterality: N/A;  220   ESOPHAGEAL DILATION N/A 03/03/2021   Procedure: ESOPHAGEAL DILATION;  Surgeon: Golda Claudis PENNER, MD;  [facility]: [facility];  Service: Endoscopy;  Laterality: N/A;   ESOPHAGOGASTRODUODENOSCOPY (EGD) WITH PROPOFOL  N/A 10/17/2018   Procedure: ESOPHAGOGASTRODUODENOSCOPY (EGD) WITH PROPOFOL ;   Surgeon: Golda Claudis PENNER, MD;  [facility];  Service: Endoscopy;  Laterality: N/A;  2:25   ESOPHAGOGASTRODUODENOSCOPY (EGD) WITH PROPOFOL  N/A 03/03/2021   Procedure: ESOPHAGOGASTRODUODENOSCOPY (EGD) WITH PROPOFOL ;  Surgeon: Golda Claudis PENNER, MD;  [facility];  Service: Endoscopy;  Laterality: N/A;  12:15   ESOPHAGOGASTRODUODENOSCOPY (EGD) WITH PROPOFOL  N/A 09/11/2023   Procedure: ESOPHAGOGASTRODUODENOSCOPY (EGD) WITH PROPOFOL ;  Surgeon: Shaaron Lamar HERO, MD;  [facility];  Service: Endoscopy;  Laterality: N/A;  2:30 pm, asa 3, LM to see if pt can come earlier   [facility] DILATION N/A 09/11/2023   Procedure: AGAPITO DILATION;  Surgeon: Shaaron Lamar HERO, MD;  [facility]: [facility];  Service: Endoscopy;  Laterality: N/A;   ORIF ANKLE FRACTURE Left 03/10/2023   Procedure: OPEN REDUCTION INTERNAL FIXATION (ORIF) ANKLE FRACTURE;  Surgeon: Georgina Ozell LABOR, MD;  Location: [facility] OR;  Service: Orthopedics;  Laterality: Left;   POLYPECTOMY  01/03/2022   Procedure: POLYPECTOMY INTESTINAL;  Surgeon: Golda Claudis PENNER, MD;  [facility];  Service: Endoscopy;;   TUBAL LIGATION       Home Medications:  Prior to Admission medications   Medication Sig Start Date End Date Taking? Authorizing Provider  acetaminophen  (  TYLENOL ) 325 MG tablet Take 2 tablets (650 mg total) by mouth every 6 (six) hours as needed for mild pain (pain score 1-3) or fever (or Fever >/= 101). 02/19/24  Yes Angiulli, Toribio PARAS, PA-C  alendronate  (FOSAMAX ) 70 MG tablet Take 1 tablet (70 mg total) by mouth every Saturday. Take with a full glass of water  on an empty stomach. 03/14/24  Yes Luking, Glendia LABOR, MD  amoxicillin  (AMOXIL ) 875 MG tablet Take 1 tablet (875 mg total) by mouth every 12 (twelve) hours. 05/11/24  Yes Dahlstedt, Garnette, MD  busPIRone  (BUSPAR ) 15 MG tablet Take 1 tablet (15 mg) by mouth 3 times daily. 03/09/24  Yes Alphonsa Glendia LABOR, MD  Calcium  Citrate 333 MG TABS Take one tablet by mouth 3  (three) times daily with meals. 03/17/24  Yes Nida, Ethelle ORN, MD  ferrous sulfate  (FEROSUL) 325 (65 FE) MG tablet Take 1 tablet (325 mg total) by mouth once a week. Patient taking differently: Take 325 mg by mouth every Friday. 04/17/24  Yes Alphonsa Glendia LABOR, MD  folic acid  (FOLVITE ) 1 MG tablet Take 1 tablet (1 mg total) by mouth daily. 05/04/24  Yes Alphonsa Glendia LABOR, MD  furosemide  (LASIX ) 20 MG tablet Take 1 tablet (20 mg total) by mouth daily. 05/02/24 06/02/24 Yes Shahmehdi, Adriana LABOR, MD  hydrocortisone  (CORTEF ) 10 MG tablet Take 2 tablets by mouth at 8:00 am and 1 tablet at lunch. (May double dose on sick days x 3-5 days) 11/14/23  Yes Nida, Ethelle ORN, MD  levothyroxine  (SYNTHROID ) 50 MCG tablet Take 1 tablet by mouth daily before breakfast. 03/09/24 09/05/24 Yes Luking, Glendia LABOR, MD  linaclotide  (LINZESS ) 145 MCG CAPS capsule Take 1 capsule (145 mcg total) by mouth daily before breakfast. 03/09/24  Yes Luking, Glendia LABOR, MD  midodrine  (PROAMATINE ) 5 MG tablet Take 1 tablet (5 mg total) by mouth 2 (two) times daily with a meal. 03/09/24  Yes Luking, Glendia LABOR, MD  ondansetron  (ZOFRAN -ODT) 8 MG disintegrating tablet Dissolve 1 tablet (8 mg total) by mouth every 8 (eight) hours as needed. Patient taking differently: Take 8 mg by mouth every 8 (eight) hours as needed for nausea or vomiting. 05/04/24 05/04/25 Yes Alphonsa Glendia LABOR, MD  oxyCODONE  (ROXICODONE ) 15 MG immediate release tablet Take 1 tablet (15 mg total) by mouth every 4 (four) hours as needed for pain 05/07/24  Yes   OXYGEN  Inhale 5 L into the lungs continuous.   Yes [provider]  pantoprazole  (PROTONIX ) 40 MG tablet Take 1 tablet (40 mg total) by mouth 2 (two) times daily. 03/09/24  Yes Alphonsa Glendia LABOR, MD  revefenacin  (YUPELRI ) 175 MCG/3ML nebulizer solution Take 3 mLs (175 mcg total) by nebulization daily. 02/24/24  Yes Angiulli, Toribio PARAS, PA-C  rosuvastatin  (CRESTOR ) 20 MG tablet Take 1 tablet (20 mg total) by mouth daily. 03/09/24  Yes  Luking, Scott A, MD  senna-docusate (SENOKOT-S) 8.6-50 MG tablet Take 2 tablets by mouth at bedtime. 01/23/24  Yes Pearlean Manus, MD  topiramate  (TOPAMAX ) 50 MG tablet Take 1 tablet (50 mg total) by mouth daily with lunch. 03/17/24  Yes Nida, Ethelle ORN, MD  Vitamin D , Ergocalciferol , (DRISDOL ) 1.25 MG (50000 UNIT) CAPS capsule Take 1 capsule (50,000 Units total) by mouth every Sunday. 03/01/24  Yes Angiulli, Toribio PARAS, PA-C  DULoxetine  (CYMBALTA ) 60 MG capsule Take 1 capsule (60 mg total) by mouth daily. Patient not taking: Reported on 05/11/2024 03/09/24   Alphonsa Glendia LABOR, MD  polyethylene glycol (MIRALAX  / GLYCOLAX ) 17  g packet Take 17 g by mouth 2 (two) times daily. 01/23/24   Pearlean Manus, MD    Scheduled Meds:  busPIRone   15 mg Oral TID   Chlorhexidine  Gluconate Cloth  6 each Topical Q0600   enoxaparin  (LOVENOX ) injection  60 mg Subcutaneous Q24H   furosemide   40 mg Intravenous Q12H   hydrocortisone   20 mg Oral Daily   And   hydrocortisone   10 mg Oral Daily   levothyroxine   50 mcg Oral QAC breakfast   linaclotide   145 mcg Oral QAC breakfast   LORazepam   1 mg Oral Once   midodrine   5 mg Oral BID WC   pantoprazole   40 mg Oral BID   polyethylene glycol  17 g Oral BID   rosuvastatin   20 mg Oral Daily   senna-docusate  2 tablet Oral QHS   topiramate   50 mg Oral Q lunch   Continuous Infusions:  PRN Meds: acetaminophen  **OR** acetaminophen , albuterol , hydrOXYzine , ondansetron  **OR** ondansetron  (ZOFRAN ) IV, oxyCODONE   Allergies:    Allergies  Allergen Reactions   Bee Venom Anaphylaxis   Contrast Media [Iodinated Contrast Media] Other (See Comments)    CT chest w/ contrast at OSH followed by SOB resolved w/ single dose steroids, never ENT swelling, intubation or pressors, has had multiple contrasted scans since   Bactrim  [Sulfamethoxazole -Trimethoprim ] Nausea And Vomiting   Codeine Itching    Social History:   Social History   Socioeconomic History   Marital status:  Married    Spouse name: Not on file   Number of children: 1   Years of education: Not on file   Highest education level: Not on file  Occupational History   Occupation: Educational psychologist: [employer]  Tobacco Use   Smoking status: Never    Passive exposure: Never   Smokeless tobacco: Never  Vaping Use   Vaping status: Never Used  Substance and Sexual Activity   Alcohol use: No   Drug use: No   Sexual activity: Yes    Birth control/protection: Post-menopausal  Other Topics Concern   Not on file  Social History Narrative   Daily caffeine    Living with husband   Right handed   She has been on disability since 2014.      Family History:   Family History  Problem Relation Age of Onset   Cardiomyopathy Mother    Breast cancer Mother    Prostate cancer Father    Heart attack Maternal Grandmother    Heart attack Maternal Grandfather    Heart attack Paternal Grandmother    Bipolar disorder Daughter    Heart disease Maternal Aunt    Neurofibromatosis Cousin    Colon cancer Neg Hx      ROS:  Please see the history of present illness.  All other ROS reviewed and negative.     Physical Exam/Data: Vitals:   05/12/24 0338 05/12/24 0425 05/12/24 0810 05/12/24 0906  BP: (!) 130/56  127/68   Pulse: 84  84   Resp: (!) 22     Temp: 98.1 F (36.7 C)     TempSrc: Oral     SpO2: 100%  98% 97%  Weight:  119.6 kg    Height:        Intake/Output Summary (Last 24 hours) at 05/12/2024 1008 Last data filed at 05/12/2024 0600 Gross per 24 hour  Intake 480 ml  Output 4925 ml  Net -4445 ml      05/12/2024  4:25 AM 05/11/2024    8:43 PM 05/11/2024    3:45 PM  Last 3 Weights  Weight (lbs) 263 lb 10.7 oz 267 lb 3.2 oz 273 lb 1.6 oz  Weight (kg) 119.6 kg 121.2 kg 123.877 kg     Body mass index is 37.83 kg/m.  General:  Well nourished, well developed, in no acute distress; wearing NV  HEENT: normal Neck: no JVD Vascular: No carotid bruits; Distal pulses 2+  bilaterally Cardiac:  normal S1, S2; RRR; no murmur  Lungs:  diminished lung bases; wheezing throughout Abd: soft, nontender, no hepatomegaly  Ext: 1+ pitting edema Musculoskeletal:  No deformities, BUE and BLE strength normal and equal Skin: warm and dry  Neuro:  CNs 2-12 intact, no focal abnormalities noted Psych:  Normal affect   EKG:  The EKG was personally reviewed and demonstrates:  NSR, HR 93, poor R wave progression, nonspecific T wave abnormalities. (No significant changes compared to 04/23/2024)  Telemetry:  Telemetry was personally reviewed and demonstrates:  NSR, HR 80-90's,brief NSVT episode of 4 beats on 9/16 at 1:04.  Relevant CV Studies:  Lexiscan  Myoview  08/08/2011: Impression Exercise Capacity:  Lexiscan  with no exercise. BP Response:  n/a  Clinical Symptoms:  n/a ECG Impression:  Mild worsening of diffuse T-wave inversion with Lexiscan  Comparison with Prior Nuclear Study: No previous nuclear study performed   Overall Impression:  There is significant gut uptake overlying the inferior wall which lowers the sensitivity of this study but LV perfusion appears normal. LV function is normal. The RV appears dilated and there is significant tracer uptake over the RV suggesting elevated R sided pressures. Would consider echocardiogram to evaluate for pulmonary HTN.   ECHO IMPRESSIONS 03/2024  1. Left ventricular ejection fraction, by estimation, is 60 to 65%. The  left ventricle has normal function. Left ventricular endocardial border  not optimally defined to evaluate regional wall motion. There is mild left  ventricular hypertrophy. Left  ventricular diastolic parameters are consistent with Grade I diastolic  dysfunction (impaired relaxation). Elevated left ventricular end-diastolic  pressure.   2. Right ventricular systolic function is normal. The right ventricular  size is mildly enlarged. Tricuspid regurgitation signal is inadequate for  assessing PA pressure.   3.  The mitral valve is normal in structure. No evidence of mitral valve  regurgitation. No evidence of mitral stenosis.   4. The aortic valve has an indeterminant number of cusps. Aortic valve  regurgitation is not visualized. No aortic stenosis is present.   5. The inferior vena cava is normal in size with greater than 50%  respiratory variability, suggesting right atrial pressure of 3 mmHg.   Comparison(s): No prior Echocardiogram.   Laboratory Data: High Sensitivity Troponin:   Recent Labs  Lab 05/11/24 1601 05/11/24 1800  TROPONINIHS 5 5     Chemistry Recent Labs  Lab 05/11/24 1601 05/11/24 1800 05/12/24 0416  NA 142  --  143  K 3.6  --  4.7  CL 102  --  101  CO2 29  --  31  GLUCOSE 122*  --  190*  BUN 5*  --  8  CREATININE 0.70  --  0.68  CALCIUM  8.7*  --  8.7*  MG  --  2.2  --   GFRNONAA >60  --  >60  ANIONGAP 11  --  11    Recent Labs  Lab 05/11/24 1601  PROT 5.9*  ALBUMIN 2.8*  AST 22  ALT 19  ALKPHOS 64  BILITOT 0.5  Lipids No results for input(s): CHOL, TRIG, HDL, LABVLDL, LDLCALC, CHOLHDL in the last 168 hours.  Hematology Recent Labs  Lab 05/11/24 1601 05/12/24 0416  WBC 9.2 8.4  RBC 3.19* 3.38*  HGB 8.6* 9.0*  HCT 29.4* 30.6*  MCV 92.2 90.5  MCH 27.0 26.6  MCHC 29.3* 29.4*  RDW 17.2* 17.1*  PLT 456* 459*   Thyroid  No results for input(s): TSH, FREET4 in the last 168 hours.  BNP Recent Labs  Lab 05/11/24 1601  BNP 53.0    DDimer No results for input(s): DDIMER in the last 168 hours.  Radiology/Studies:  DG Chest Portable 1 View Result Date: 05/11/2024 CLINICAL DATA:  Bilateral lower extremity and abdominal swelling. History of CHF. EXAM: PORTABLE CHEST 1 VIEW COMPARISON:  05/01/2024. FINDINGS: The heart size and mediastinal contours are unchanged. No significant change in pulmonary vascular congestion. Similar small left pleural effusion with left basilar opacity. No pneumothorax. No acute osseous abnormality.  IMPRESSION: 1. Similar small left pleural effusion with left basilar opacity. 2. Similar pulmonary vascular congestion. Electronically Signed   By: Harrietta Sherry M.D.   On: 05/11/2024 16:17     Assessment and Plan: Acute on chronic HFpEF Echo 04/24/2024: EF 60 to 65%, mild LVH, G1DD, elevated LVEDP, mildly enlarged RV. Presents with worsening dyspnea, swelling in face/ abdomen/ LE, and 29 pound weight gain (243 >272) since recent hospitalization.  X-ray shows pulmonary vascular congestion.  BNP-53. Likely not reflective of volume status due to body wt and preserved EF.  Treated with IV Lasix  80 mg x1, IV Lasix  40 mg x1. Net I/O -5605, wt 273 lbs > 263 lbs, Cr 0.7 > 0.68  Patient still appears volume overloaded.  Would consider increasing IV Lasix  to 60 mg twice daily. As above, patient is adamant about being transferred to [facility] for catheterization per pulmonology request.  Can consider RHC with history of ILD/pulmonary hypertension/sarcoidosis. Will discuss with MD.   Chest pain  Lexiscan  Myoview  08/08/2011: overall normal but suggestive of elevated right side pressure. Recommended ECHO for further pulmonary HTN eval.  Patient reports ongoing constant worsening mix of typical and typical chest pain since last hospitalization.  Less concern for ischemic cause with negative troponins and EKG without ischemic changes.  However with presentation, could consider LHC if we proceed with RHC.  Will discuss with MD If cath not indicated, then would stress test.  Patient was not open to discussing stress test at this time because adamant about cath. Suspect chest pain secondary to underlying pulmonary etiology.  OSA  Not complaint with CPAP   Chronic Resp. Failure with Hypoxia  ILD  Sarcoidosis  Pulmonary HTN Managed by primary team   Risk Assessment/Risk Scores:   [place]  Heart Association (NYHA) Functional Class NYHA Class III       For questions or updates, please contact  [facility] Please consult [email or web address] for contact info under      Signed, Lorette CINDERELLA Kapur, PA-C  05/12/2024 10:08 AM   Attending Note  Patient seen and discussed with PA Kapur, I agree with her docuementation. 63 yo female history of pulmonary sarcoid, obesity, OSA, chronic HFpEF, presents with progressing DOE and LE edema. She reports compliance with home lasix  20mg  daily. Admit just last month with UTI/urinary retention and acute on chronic HFpEF as well. Discharge weight was 250 lbs which was a standing weight, weight she reports prior to admission at her pulmonologist was up to 272 lsb.    K 3.6 BUN  5 Cr 0.70 BNP 53 Mg 2.2 Trop 5-->5 EKG: RRR, no m/rg, no jvd CXR: stable small left pleural effusion, +vascular congestion EKG NSR     Assessment/Plan   1.Acute on chronic HFpEF - CXR similar left small effusion, +pulmonary congestion. BNP 53 -from notes up 22 lbs from recent discharge, difficult to assess accuracy given bed weigths. There is a documented standing weight of 250 lbs, admission weight listed at 272-->263 lbs   -03/2024 Echo reviewed. LVEF 60-65%, no WMAs. Mild flattening of ventricular septum suggesting some degree of RV pressure overload. Normal LA and RA sizes. Indeteriminate diastolic function, tissue e' velocity medial and lateral mildly decreased however fairly normal MV inflow patter. E/e' average is borderline elevated. Normal LA would argue against severe dysfunction. RV has normal size and function. TR jet insufficent to estimate PASP. RA pressure 8 mmHg.  - has had elevated PASP by echo in the past however this was in the setting of volume overload (07/2016 echo PASP in the 50s).    - received IV lasix  80mg  x 1 yesterday, due for 40mg  bid today. Incomplete I/Os, negative roughly 4.4 L overnight. Renal function is stable, continue IV diuresis. Increase to 60mg  bid.  - recent admit with UTI and urinary retention. Recurrent UTIs have been an issue,  avoid SGLT2i  - she reports her lung doctor at [facility] requested a cath during this admission, there is no documentation yet in her clinic chart and cardiology was not contacted. We will reachout to her lung team, its unclear at this time the details on this. Once euvolemic may be reasonable to consider transfer to [facility] for HF team evaluation given her sarcoid history and recurrent HFpEF presentations.  - no significant arrhythmias noted thus far.      2.Pulmonary sarcoid/ILD - chronically on 5L [place] at home - followed by [facility] pulmonary - I do not see any prior testing for cardiac sarcoid involvement   3. Chronic pain syndrome     4. OSA - not using cpap   5. Adrenal insufficiency/Chronic hypotension - she is on steroids, also midodrine  as outpatient.    Dorn Ross MD

## 2024-05-12 NOTE — Plan of Care (Signed)
  Problem: Acute Rehab PT Goals(only PT should resolve) Goal: Pt Will Go Supine/Side To Sit Outcome: Progressing Flowsheets (Taken 05/12/2024 1348) Pt will go Supine/Side to Sit:  with modified independence  with HOB elevated Goal: Patient Will Transfer Sit To/From Stand Outcome: Progressing Flowsheets (Taken 05/12/2024 1348) Patient will transfer sit to/from stand: with contact guard assist Goal: Pt Will Transfer Bed To Chair/Chair To Bed Outcome: Progressing Flowsheets (Taken 05/12/2024 1348) Pt will Transfer Bed to Chair/Chair to Bed: with contact guard assist Goal: Pt Will Ambulate Outcome: Progressing Flowsheets (Taken 05/12/2024 1348) Pt will Ambulate:  15 feet  25 feet  with contact guard assist  with rolling walker

## 2024-05-12 NOTE — Evaluation (Signed)
 Occupational Therapy Evaluation Patient Details Name: Kathleen [name] MRN: [id number] DOB: Jan 19, 1961 Today's Date: 05/12/2024   History of Present Illness   Kathleen [name] is a 63 y.o. female with medical history significant for congestive heart failure, sarcoidosis, chronic respiratory failure on 5 L, adrenal insufficiency, chronic pain.  Patient presented to the ED with complaints of bilateral lower extremity swelling, abdominal distention, difficulty breathing, and weight gain that started a couple of days after discharge from the hospital.  She reports compliance with her medications-Lasix  20 mg daily.  She reports reduced response to Lasix .  She reports mid central sharp chest pain that sometimes radiates down her left arm, chest pain is intermittent with no aggravating factors and sometimes occurs at rest.     Clinical Impressions Pt agreeable to OT and PT co-evaluation but pt requested not to stand due to pain and fluid in B LE. Pt is assisted for ADL's by husband at baseline but is able to do feed and groom. Pt required supervision assist for bed mobility today using rails and HOB elevation. Pt is very weak in B UE but demonstrates near full A/ROM. Pt not interested in SNF and requested CIR placement versus SNF. Pt left in the bed with call bell within reach. Pt will benefit from continued OT in the hospital to increase strength, balance, and endurance for safe ADL's.        If plan is discharge home, recommend the following:   A lot of help with walking and/or transfers;A lot of help with bathing/dressing/bathroom;Assistance with cooking/housework;Assist for transportation;Help with stairs or ramp for entrance     Functional Status Assessment   Patient has had a recent decline in their functional status and demonstrates the ability to make significant improvements in function in a reasonable and predictable amount of time.     Equipment Recommendations   None recommended by  OT             Precautions/Restrictions   Precautions Precautions: Fall Recall of Precautions/Restrictions: Intact Restrictions Weight Bearing Restrictions Per Provider Order: No     Mobility Bed Mobility Overal bed mobility: Needs Assistance Bed Mobility: Supine to Sit, Sit to Supine     Supine to sit: Supervision, HOB elevated Sit to supine: Supervision, HOB elevated   General bed mobility comments: labored; use of rails and HOB elevated; able to grasp overhead rail to scoot up once supine as well.    Transfers                   General transfer comment: Unable to attempt per pt's request due to pain and fluid in B LE.      Balance Overall balance assessment: Needs assistance Sitting-balance support: Feet unsupported, Bilateral upper extremity supported Sitting balance-Leahy Scale: Fair Sitting balance - Comments: fair seated EOB                                   ADL either performed or assessed with clinical judgement   ADL Overall ADL's : Needs assistance/impaired Eating/Feeding: Modified independent;Set up;Sitting   Grooming: Contact guard assist;Minimal assistance;Sitting   Upper Body Bathing: Contact guard assist;Minimal assistance;Sitting   Lower Body Bathing: Maximal assistance;Sitting/lateral leans;Total assistance   Upper Body Dressing : Contact guard assist;Minimal assistance;Sitting   Lower Body Dressing: Total assistance;Maximal assistance;Sitting/lateral leans     Toilet Transfer Details (indicate cue type and reason): Unable to attempt today per  pt's report she could not stand until fluid is removed from her legs. Toileting- Clothing Manipulation and Hygiene: Maximal assistance;Sitting/lateral lean     Tub/Shower Transfer Details (indicate cue type and reason): Unable to attempt or simulate today per pt's report she could not stand until fluid is removed from her legs. Functional mobility during ADLs:  (Unable to  attempt today per pt's report she could not stand until fluid is removed from her legs.) General ADL Comments: Levels taken from best judgement given what pt demonstratred today.     Vision Baseline Vision/History: 1 Wears glasses Ability to See in Adequate Light: 1 Impaired Vision Assessment?: No apparent visual deficits     Perception Perception: Not tested       Praxis Praxis: Not tested       Pertinent Vitals/Pain Pain Assessment Pain Assessment: Faces Faces Pain Scale: Hurts whole lot Pain Location: BIL legs and feet Pain Descriptors / Indicators: Discomfort, Grimacing Pain Intervention(s): Limited activity within patient's tolerance, Monitored during session     Extremity/Trunk Assessment Upper Extremity Assessment Upper Extremity Assessment: Generalized weakness   Lower Extremity Assessment Lower Extremity Assessment: Defer to PT evaluation   Cervical / Trunk Assessment Cervical / Trunk Assessment: Kyphotic   Communication Communication Communication: No apparent difficulties   Cognition Arousal: Alert Behavior During Therapy: WFL for tasks assessed/performed, Anxious Cognition: No apparent impairments                               Following commands: Intact       Cueing  General Comments   Cueing Techniques: Verbal cues                 Home Living Family/patient expects to be discharged to:: Private residence Living Arrangements: Spouse/significant other Available Help at Discharge: Family;Available PRN/intermittently Type of Home: House Home Access: Stairs to enter [employer] of Steps: 1 Entrance Stairs-Rails: None Home Layout: One level     Bathroom Shower/Tub: Chief Strategy Officer: Handicapped height Bathroom Accessibility: Yes   Home Equipment: Agricultural consultant (2 wheels);Electric scooter;Shower seat;Hand held shower head;BSC/3in1   Additional Comments: Pt confirms home history      Prior  Functioning/Environment Prior Level of Function : Needs assist       Physical Assist : Mobility (physical);ADLs (physical) Mobility (physical): Transfers;Gait;Stairs ADLs (physical): Dressing;Bathing;Toileting;IADLs Mobility Comments: Pt report reports receiving same level of assistance with mobility; very short household community ambulator with RW, uses scooter mostly. Pt reports she does not walk without someone being there with her due to fear of falls. No falls in last 6 months but within 2 last years. Does not drive. Pt also reported she had not stood in 2 days. Pt reports she was able to take lateral steps with RW only. ADLs Comments: Pt reports she is assited for bathing, dressing, and toileting. able to feed and groom. Pt reports no change in ADL status since recent admission.    OT Problem List: Decreased strength;Decreased activity tolerance;Obesity;Cardiopulmonary status limiting activity;Impaired balance (sitting and/or standing)   OT Treatment/Interventions: Self-care/ADL training;Therapeutic exercise;Therapeutic activities;Patient/family education;Balance training      OT Goals(Current goals can be found in the care plan section)   Acute Rehab OT Goals Patient Stated Goal: Reduce pain OT Goal Formulation: With patient Time For Goal Achievement: 05/26/24 Potential to Achieve Goals: Good   OT Frequency:  Min 3X/week    Co-evaluation PT/OT/SLP Co-Evaluation/Treatment: Yes Reason for Co-Treatment:  To address functional/ADL transfers PT goals addressed during session: Mobility/safety with mobility;Balance OT goals addressed during session: ADL's and self-care                       End of Session    Activity Tolerance: Patient limited by pain Patient left: in bed;with call bell/phone within reach  OT Visit Diagnosis: Unsteadiness on feet (R26.81);Other abnormalities of gait and mobility (R26.89);Muscle weakness (generalized) (M62.81)                Time:  1043-1100 OT Time Calculation (min): 17 min Charges:  OT General Charges $OT Visit: 1 Visit OT Evaluation $OT Eval Low Complexity: 1 Low  Purva Vessell OT, MOT  Jayson Person 05/12/2024, 1:57 PM

## 2024-05-12 NOTE — Progress Notes (Signed)
 Inpatient Rehab Admissions Coordinator:   Per therapy recommendations, patient was screened for CIR candidacy by Leita Kleine, MS, CCC-SLP. At this time, Pt. does not appear to demonstrate medical necessity to justify in hospital rehabilitation/CIR. She also does not appear able to tolerate intensity. I  will not pursue a rehab consult for this Pt.   Recommend other rehab venues to be pursued.  Please contact me with any questions.  Leita Kleine, MS, CCC-SLP Rehab Admissions Coordinator  [phone] (celll) [phone] (office)

## 2024-05-12 NOTE — Hospital Course (Addendum)
 Kathleen [name] is a 63 y.o. female with PMH of heart failure, paroxysmal atrial fibrillation, sarcoidosis, chronic respiratory failure on oxygen , adrenal insufficiency, chronic pain presented secondary to shortness of breath and swelling, found to have evidence of acute on chronic heart failure, cardiology was consulted, patient was admitted and started on IV diuretics Patient is very ill with complex medical issues, with pulmonary sarcoidosis, previously on steroid and had AVN of the hip and multiple compression fracture, orthopedic felt not a candidate for surgery for AVN and has severely limited mobility, mostly bedbound except for [facility].  She has chronic hypoxic tori failure on supplemental oxygen .. 9/19> transferred from [facility] to [facility] for advanced heart failure team evaluation 9/22: s/p RHC-no pulmonary hypertension. Volume status stabilized, patient now on oral torsemide , cardiology team signed off.   Assessment and plan  Acute on chronic HFpEF Left small effusion/pulmonary congestion Chronic dyspnea Recent admissions for acute on chronic HFpEF Mild hypokalemia from diuresis PTA 22 LB weight gain w/ recent echo August EF 60 to 65% indeterminate diastolic function, elevated PASP in past. Managed with diuresis, transferred to [facility] for AHF team eval Recent pulmonary eval at [facility] requesting RHC to eval for underlying pulmonary hypertension given her sarcoid/ILD With aggressive diuresis: About 30 pound weight loss and remains euvolemic, Lasix  discontinued 9/21> resumed torsemide  po 9/23 RHC 9/22: Mildly elevated right-sided filling pressure no significant pulmonary hypertension normal PCWP preserved cardiac output Cardiology has signed off plan is to discharge  New onset PA-fib: Heparin  has been switched to Eliquis .She had history of A-fib when she was transferred to [facility] in 2017 but had converted to sinus rhythm upon transfer.  Watch for any kind of fall risk and adjust as OP    Pulmonary sarcoidosis/ILD Chronic hypoxic respiratory failure on 5 L [place]: Pulmonary status remains tenuous, continue home oxygen  setting RHC finding with normal pulmonary pressure as above.   she is followed by [name]  has been stopped, cannot tolerate methotrexate due to diarrhea hair loss, cannot tolerate CellCept due to diarrhea>advised pulmonary rehab. Last CT chest 5/26 /25> showed similar appearing chronic bilateral upper lobe paramediastinal architectural distortion, bronchiectasis and peribronchial consolidation/scaring  Normocytic anemia: Suspect from chronic illness hemoglobin stable 9 to 10 g/dL  Lethargy Weakness: Mental status improved stable. Minimize opioid/pain meds continue PT OT   Goals of care Bed bound w/ dyspnea Ambulatory dysfunction Frequent hospitalizations-she had 9 hospitalizations since January this year Overall poor prognosis, question probably has progression of the disease.  Patient was transferred for cardiac cath RHC without significant pulmonary hypertension.Palliative care follwed  OSA Not using CPAP  Chronic pain syndrome Anxiety/depression Pain controlled mood stable continue home meds including -BuSpar , Topamax , oxycodone   Adrenal insufficiency Hypothyroidism Chronic hypotension Continue synthroid , midodrine  and Cortef   HLD Continue statin  Constipation continue Linzess , MiraLAX .  Class I Obesity w/ Body mass index is 35.78 kg/m. Will benefit with PCP follow-up, weight loss,healthy lifestyle

## 2024-05-12 NOTE — Plan of Care (Signed)
  Problem: Education: Goal: Knowledge of General Education information will improve Description: Including pain rating scale, medication(s)/side effects and non-pharmacologic comfort measures Outcome: Progressing   Problem: Health Behavior/Discharge Planning: Goal: Ability to manage health-related needs will improve Outcome: Progressing   Problem: Clinical Measurements: Goal: Ability to maintain clinical measurements within normal limits will improve Outcome: Progressing Goal: Respiratory complications will improve Outcome: Not Met (add Reason)   Problem: Activity: Goal: Risk for activity intolerance will decrease Outcome: Not Met (add Reason)

## 2024-05-12 NOTE — Progress Notes (Signed)
 Paged by nursing regarding patient having chest pain. This evening after eating dinner, patient had an episode of vomiting with associated chest pain and concerns that she is having a heart attack. EKG obtained and after review, shows normal sinus rhythm with non-specific t-wave changes that are stable. Patient was given Zofran  IV.   At bedside, patient was on the phone speaking in full sentences in no visible distress. Patient describes pain that is epigastric with radiation left around her breast. Patient reports dyspnea; SpO2 of 94% on 4 L/min. Patient is adamant that she should be transferred to [facility] for a heart catheterization. I discussed with her that cardiology is not recommending this at the current time and to focus on diuresis. Patient continues to request transfer to [facility] for a heart catheterization.  On physical exam, patient has normal rate and rhythm, with diminished but otherwise clear breath sounds. Patient appears to have reproducible pain from lower sternum that tracks along left rib.    Assessment/plan:  Chest pain Seems atypical and reproducible. Possibly related to rib pain from cough. EKG without evidence of acute ischemia.  -Check chest x-ray -Check stat Troponin -Continue analgesics as needed  Elgin Lam, MD [facility] 05/12/2024, 7:07 PM

## 2024-05-12 NOTE — Plan of Care (Signed)
 Pt had close to 4500 ml OP since placement of foley catheter in the ED.  Tele called around 0100 to report 4 beats of non-sustained V-tach. Pt was resting in NAD at this time. Provider notified. 40mEq PO K given per order.    Problem: Education: Goal: Knowledge of General Education information will improve Description: Including pain rating scale, medication(s)/side effects and non-pharmacologic comfort measures Outcome: Progressing   Problem: Health Behavior/Discharge Planning: Goal: Ability to manage health-related needs will improve Outcome: Progressing   Problem: Clinical Measurements: Goal: Ability to maintain clinical measurements within normal limits will improve Outcome: Progressing Goal: Will remain free from infection Outcome: Progressing Goal: Diagnostic test results will improve Outcome: Progressing Goal: Respiratory complications will improve Outcome: Progressing Goal: Cardiovascular complication will be avoided Outcome: Progressing   Problem: Activity: Goal: Risk for activity intolerance will decrease Outcome: Progressing   Problem: Nutrition: Goal: Adequate nutrition will be maintained Outcome: Progressing   Problem: Coping: Goal: Level of anxiety will decrease Outcome: Progressing   Problem: Elimination: Goal: Will not experience complications related to bowel motility Outcome: Progressing Goal: Will not experience complications related to urinary retention Outcome: Progressing   Problem: Pain Managment: Goal: General experience of comfort will improve and/or be controlled Outcome: Progressing   Problem: Safety: Goal: Ability to remain free from injury will improve Outcome: Progressing   Problem: Skin Integrity: Goal: Risk for impaired skin integrity will decrease Outcome: Progressing   Problem: Education: Goal: Ability to demonstrate management of disease process will improve Outcome: Progressing Goal: Ability to verbalize understanding of  medication therapies will improve Outcome: Progressing Goal: Individualized Educational Video(s) Outcome: Progressing   Problem: Activity: Goal: Capacity to carry out activities will improve Outcome: Progressing   Problem: Cardiac: Goal: Ability to achieve and maintain adequate cardiopulmonary perfusion will improve Outcome: Progressing

## 2024-05-12 NOTE — Progress Notes (Signed)
 Patient having second episode of chest pain today - EKG done earlier today. BP 124/51 HR 92. Patient was sitting up eating dinner yelling she was having a heart attack while sipping her soup. Complaining of not being able to breath, 02 94% on 4 liters. Patient started feeling nauseous and vomiting - zofran  given. Patient now stating she [facility] because she thinks she is having a heart attack or feels like something bad is going to happen. Second EKG obtained reading normal sinus, low voltage QRD and nonspecific T wave abnormality. Amion sent to [facility].

## 2024-05-12 NOTE — Progress Notes (Signed)
 Unable to perform REDs clip reading as patient does not meet the criteria.

## 2024-05-12 NOTE — Progress Notes (Signed)
 PROGRESS NOTE    Kathleen [name]  FMW:[id number] DOB: 23-Feb-1961 DOA: 05/11/2024 PCP: Alphonsa Glendia LABOR, MD   Brief Narrative: Kathleen [name] is a 63 y.o. female with a history of heart failure, paroxysmal atrial fibrillation, sarcoidosis, chronic respiratory failure on oxygen , adrenal insufficiency, chronic pain.  Patient presented secondary to shortness of breath and swelling, found to have evidence of acute on chronic heart failure. IV diuresis started.   Assessment and Plan:  Acute on chronic HFpEF Most recent Transthoracic Echocardiogram from 04/24/2024 significant for LVEF of 60-65% with grade 1 diastolic dysfunction, normal RV function and mildly enlarged RV size. BNP of 53, which is elevated for patient's BMI. Chest imaging significant for small left pleural effusion and pulmonary vascular congestion. Overall, patient appears to have had a 20+ pound weight gain since discharge. Weight of 273.1 lbs (123.9 kg) on admission. Lasix  80 mg IV given, and decreased to Lasix  40 mg. Patient is adamant about needing a heart catheterization -Cardiology recommendations: Lasix  60 mg BID -Strict in/out  Chronic respiratory failure with hypoxia Patient is on oxygen  therapy at home at 5 L/min. Currently at baseline rate. -Continue oxygen   Interstitial lung disease Noted. Patient follows with pulmonology as an outpatient. Patient is on Yupelri  as an outpatient. -Continue Yupelri  and albuterol  as needed  Depression Anxiety Patient is on Buspar ; previously on Cymbalta , for which she stopped taking. -Continue Buspar   Adrenal insufficiency Patient is on Cortef  and midodrine  as an outpatient. Patient given Solu-medrol  and restarted on home Cortef  and midodrine . -Continue Cortef  20 mg/ 10 mg and midodrine  5 mg BID  Hypothyroidism -Continue Synthroid  50 mcg daily  Chronic pain syndrome -Continue home oxycodone  15 mg q4 hours  Obstructive sleep apnea Noted. Not on CPAP therapy.    DVT  prophylaxis: Lovenox  Code Status:   Code Status: Full Code Family Communication: None at bedside Disposition Plan: Discharge pending improvement of volume status   Consultants:  Cardiology  Procedures:  None  Antimicrobials: Ceftriaxone  IV    Subjective: Patient reports dyspnea. She is adamant that she needs to go to [facility] for a heart catheterization. She states a physician here told her she needs to go, and that her pulmonologist told her she needs to have a heart catheterization. She also reports generalized pain.  Objective: BP 127/68   Pulse 84   Temp 98.1 F (36.7 C) (Oral)   Resp (!) 22   Ht 5' 10 (1.778 m)   Wt 119.6 kg   LMP 05/17/2016   SpO2 97%   BMI 37.83 kg/m   Examination:  General exam: Appears calm and comfortable Respiratory system: Clear to auscultation. Respiratory effort normal. Cardiovascular system: S1 & S2 heard, RRR. No murmurs, rubs, gallops or clicks. Gastrointestinal system: Abdomen is nondistended, soft and nontender. Normal bowel sounds heard. Central nervous system: Alert and oriented. No focal neurological deficits. Musculoskeletal: 2+ bilateral LE pitting edema. No calf tenderness Skin: No cyanosis. No rashes Psychiatry: Judgement and insight appear normal. Mood & affect appropriate.    Data Reviewed: I have personally reviewed following labs and imaging studies  CBC Lab Results  Component Value Date   WBC 8.4 05/12/2024   RBC 3.38 (L) 05/12/2024   HGB 9.0 (L) 05/12/2024   HCT 30.6 (L) 05/12/2024   MCV 90.5 05/12/2024   MCH 26.6 05/12/2024   PLT 459 (H) 05/12/2024   MCHC 29.4 (L) 05/12/2024   RDW 17.1 (H) 05/12/2024   LYMPHSABS 1.0 05/11/2024   MONOABS 0.8 05/11/2024  EOSABS 0.5 05/11/2024   BASOSABS 0.1 05/11/2024     Last metabolic panel Lab Results  Component Value Date   NA 143 05/12/2024   K 4.7 05/12/2024   CL 101 05/12/2024   CO2 31 05/12/2024   BUN 8 05/12/2024   CREATININE 0.68 05/12/2024    GLUCOSE 190 (H) 05/12/2024   GFRNONAA >60 05/12/2024   GFRAA 92 05/24/2020   CALCIUM  8.7 (L) 05/12/2024   PHOS 4.1 01/29/2024   PROT 5.9 (L) 05/11/2024   ALBUMIN 2.8 (L) 05/11/2024   LABGLOB 2.0 01/24/2024   AGRATIO 1.9 09/26/2022   BILITOT 0.5 05/11/2024   ALKPHOS 64 05/11/2024   AST 22 05/11/2024   ALT 19 05/11/2024   ANIONGAP 11 05/12/2024    GFR: Estimated Creatinine Clearance: 103.6 mL/min (by C-G formula based on SCr of 0.68 mg/dL).  Recent Results (from the past 240 hours)  Urine Culture     Status: Abnormal   Collection Time: 05/05/24  2:42 PM   Specimen: Urine, Clean Catch   UR  Result Value Ref Range Status   Urine Culture, Routine Final report (A)  Final   Organism ID, Bacteria Enterococcus faecalis (A)  Final    Comment: Enterococci susceptible to penicillin  are predictably susceptible to ampicillin, amoxicillin , ampicillin-sulbactam, amoxicillin -clavulanate, and piperacillin -tazobactam for non-beta-lactamase producing enterococci. (CLSI 2018) For Enterococcus species, aminoglycosides (except for high-level resistance screening), cephalosporins, clindamycin , and trimethoprim -sulfamethoxazole  are not effective clinically. (CLSI, M100-S26, 2016) Greater than 100,000 colony forming units per mL    Antimicrobial Susceptibility Comment  Final    Comment:       ** S = Susceptible; I = Intermediate; R = Resistant **                    P = Positive; N = Negative             MICS are expressed in micrograms per mL    Antibiotic                 RSLT#1    RSLT#2    RSLT#3    RSLT#4 Ciprofloxacin                   S Levofloxacin                    S Nitrofurantoin                 S Penicillin                      S Tetracycline                   S Vancomycin                      S   Microscopic Examination     Status: Abnormal   Collection Time: 05/05/24  2:56 PM   Urine  Result Value Ref Range Status   WBC, UA 6-10 (A) 0 - 5 /hpf Final   RBC, Urine 0-2 0 - 2 /hpf  Final   Epithelial Cells (non renal) 0-10 0 - 10 /hpf Final   Bacteria, UA None seen None seen/Few Final  Resp panel by RT-PCR (RSV, Flu A&B, Covid) Anterior Nasal Swab     Status: None   Collection Time: 05/11/24  4:05 PM   Specimen: Anterior Nasal Swab  Result Value Ref Range Status   SARS Coronavirus 2 by RT PCR NEGATIVE  NEGATIVE Final    Comment: (NOTE) SARS-CoV-2 target nucleic acids are NOT DETECTED.  The SARS-CoV-2 RNA is generally detectable in upper respiratory specimens during the acute phase of infection. The lowest concentration of SARS-CoV-2 viral copies this assay can detect is 138 copies/mL. A negative result does not preclude SARS-Cov-2 infection and should not be used as the sole basis for treatment or other patient management decisions. A negative result may occur with  improper specimen collection/handling, submission of specimen other than nasopharyngeal swab, presence of viral mutation(s) within the areas targeted by this assay, and inadequate number of viral copies(<138 copies/mL). A negative result must be combined with clinical observations, patient history, and epidemiological information. The expected result is Negative.  Fact Sheet for Patients:  BloggerCourse.com  Fact Sheet for Healthcare Providers:  SeriousBroker.it  This test is no t yet approved or cleared by the [place]  FDA and  has been authorized for detection and/or diagnosis of SARS-CoV-2 by FDA under an Emergency Use Authorization (EUA). This EUA will remain  in effect (meaning this test can be used) for the duration of the COVID-19 declaration under Section 564(b)(1) of the Act, 21 U.S.C.section 360bbb-3(b)(1), unless the authorization is terminated  or revoked sooner.       Influenza A by PCR NEGATIVE NEGATIVE Final   Influenza B by PCR NEGATIVE NEGATIVE Final    Comment: (NOTE) The Xpert Xpress SARS-CoV-2/FLU/RSV plus assay is  intended as an aid in the diagnosis of influenza from Nasopharyngeal swab specimens and should not be used as a sole basis for treatment. Nasal washings and aspirates are unacceptable for Xpert Xpress SARS-CoV-2/FLU/RSV testing.  Fact Sheet for Patients: BloggerCourse.com  Fact Sheet for Healthcare Providers: SeriousBroker.it  This test is not yet approved or cleared by the [place]  FDA and has been authorized for detection and/or diagnosis of SARS-CoV-2 by FDA under an Emergency Use Authorization (EUA). This EUA will remain in effect (meaning this test can be used) for the duration of the COVID-19 declaration under Section 564(b)(1) of the Act, 21 U.S.C. section 360bbb-3(b)(1), unless the authorization is terminated or revoked.     Resp Syncytial Virus by PCR NEGATIVE NEGATIVE Final    Comment: (NOTE) Fact Sheet for Patients: BloggerCourse.com  Fact Sheet for Healthcare Providers: SeriousBroker.it  This test is not yet approved or cleared by the [place]  FDA and has been authorized for detection and/or diagnosis of SARS-CoV-2 by FDA under an Emergency Use Authorization (EUA). This EUA will remain in effect (meaning this test can be used) for the duration of the COVID-19 declaration under Section 564(b)(1) of the Act, 21 U.S.C. section 360bbb-3(b)(1), unless the authorization is terminated or revoked.  Performed at [facility], [address] [place], [place] [address]       Radiology Studies: DG Chest Portable 1 View Result Date: 05/11/2024 CLINICAL DATA:  Bilateral lower extremity and abdominal swelling. History of CHF. EXAM: PORTABLE CHEST 1 VIEW COMPARISON:  05/01/2024. FINDINGS: The heart size and mediastinal contours are unchanged. No significant change in pulmonary vascular congestion. Similar small left pleural effusion with left basilar opacity. No  pneumothorax. No acute osseous abnormality. IMPRESSION: 1. Similar small left pleural effusion with left basilar opacity. 2. Similar pulmonary vascular congestion. Electronically Signed   By: Harrietta Sherry M.D.   On: 05/11/2024 16:17      LOS: 1 day    Elgin Lam, MD [facility] 05/12/2024, 10:08 AM   If 7PM-7AM, please contact night-coverage [email or web address]

## 2024-05-12 NOTE — Evaluation (Signed)
 Physical Therapy Evaluation Patient Details Name: Kathleen [name] MRN: [id number] DOB: 10-Aug-1961 Today's Date: 05/12/2024  History of Present Illness  Kathleen [name] is a 63 y.o. female with medical history significant for congestive heart failure, sarcoidosis, chronic respiratory failure on 5 L, adrenal insufficiency, chronic pain.  Patient presented to the ED with complaints of bilateral lower extremity swelling, abdominal distention, difficulty breathing, and weight gain that started a couple of days after discharge from the hospital.  She reports compliance with her medications-Lasix  20 mg daily.  She reports reduced response to Lasix .  She reports mid central sharp chest pain that sometimes radiates down her left arm, chest pain is intermittent with no aggravating factors and sometimes occurs at rest.   Clinical Impression  Patient demonstrates slow labored movement for sitting up at bedside with [facility] elevated to mimic home environment. Patient exerts maximal effort, taking rest breaks PRN, to complete this task. Patient presents with fair sitting balance with observable postural control deficits as patient defaults to bilateral support, like secondary to core/generalized weakness. Unable to attempt transfer or progress ambulation at this time due pt c/o excruciating pain in BIL LE and increased swelling in feet. Patient expressed adamant interests in continuing rehab at [facility]. Patient will benefit from continued skilled physical therapy in hospital and recommended venue below to increase strength, balance, endurance for safe ADLs and gait.         If plan is discharge home, recommend the following: A lot of help with walking and/or transfers;Help with stairs or ramp for entrance;Assist for transportation   Can travel by private vehicle   No    Equipment Recommendations None recommended by PT  Recommendations for Other Services       Functional Status Assessment Patient has had a recent  decline in their functional status and demonstrates the ability to make significant improvements in function in a reasonable and predictable amount of time.     Precautions / Restrictions Precautions Precautions: Fall Recall of Precautions/Restrictions: Intact Restrictions Weight Bearing Restrictions Per Provider Order: No      Mobility  Bed Mobility Overal bed mobility: Needs Assistance Bed Mobility: Supine to Sit, Sit to Supine     Supine to sit: Supervision, HOB elevated Sit to supine: Supervision, HOB elevated   General bed mobility comments: Very labored movement, increased time requiring frequent breaks for upright positioning    Transfers                   General transfer comment: Unable to attempt transfer due to pt c/o excrutiating pain in BIL LEs and increased fluid in feet    Ambulation/Gait         Gait velocity: N/A     General Gait Details: Unable to attempt ambulation due to pt c/o excrutiating pain in BIL LEs and increased fluid in feet  Stairs            Wheelchair Mobility     Tilt Bed    Modified Rankin (Stroke Patients Only)       Balance   Sitting-balance support: Feet unsupported, Bilateral upper extremity supported Sitting balance-Leahy Scale: Fair Sitting balance - Comments: fair seated EOB       Standing balance comment: Unable to test                             Pertinent Vitals/Pain Pain Assessment Pain Assessment: Faces Faces Pain Scale: Hurts worst  Pain Location: BIL legs and feet Pain Descriptors / Indicators: Discomfort, Grimacing Pain Intervention(s): Limited activity within patient's tolerance, Monitored during session, Repositioned    Home Living Family/patient expects to be discharged to:: Private residence Living Arrangements: Spouse/significant other Available Help at Discharge: Family;Available PRN/intermittently Type of Home: House Home Access: Stairs to enter Entrance  Stairs-Rails: None Entrance Stairs-Number of Steps: 1   Home Layout: One level Home Equipment: Agricultural consultant (2 wheels);Electric scooter;Shower seat;Hand held shower head;BSC/3in1 Additional Comments: Pt confirms home history    Prior Function Prior Level of Function : Needs assist       Physical Assist : Mobility (physical);ADLs (physical) Mobility (physical): Transfers;Gait;Stairs ADLs (physical): Dressing;Bathing;Toileting;IADLs Mobility Comments: Pt report reports receiving same level of assistance with mobility; very short household community ambulator with RW, uses scooter mostly. Pt reports she does not walk without someone being there with her due to fear of falls. No falls in last 6 months but within 2 last years. Does not drive. ADLs Comments: Pt confirms husband assists with ADLs.     Extremity/Trunk Assessment   Upper Extremity Assessment Upper Extremity Assessment: Defer to OT evaluation    Lower Extremity Assessment Lower Extremity Assessment: Generalized weakness    Cervical / Trunk Assessment Cervical / Trunk Assessment: Kyphotic  Communication   Communication Communication: No apparent difficulties    Cognition Arousal: Alert Behavior During Therapy: WFL for tasks assessed/performed, Anxious   PT - Cognitive impairments: No apparent impairments                         Following commands: Intact       Cueing Cueing Techniques: Verbal cues     General Comments      Exercises     Assessment/Plan    PT Assessment Patient needs continued PT services  PT Problem List Decreased strength;Decreased activity tolerance;Decreased balance;Decreased mobility       PT Treatment Interventions DME instruction;Balance training;Gait training;Functional mobility training;Therapeutic activities;Therapeutic exercise;Patient/family education    PT Goals (Current goals can be found in the Care Plan section)  Acute Rehab PT Goals Patient Stated Goal:  returnh home PT Goal Formulation: With patient Time For Goal Achievement: 05/26/24 Potential to Achieve Goals: Fair    Frequency Min 3X/week     Co-evaluation PT/OT/SLP Co-Evaluation/Treatment: Yes Reason for Co-Treatment: To address functional/ADL transfers PT goals addressed during session: Mobility/safety with mobility;Balance         AM-PAC PT 6 Clicks Mobility  Outcome Measure Help needed turning from your back to your side while in a flat bed without using bedrails?: A Little Help needed moving from lying on your back to sitting on the side of a flat bed without using bedrails?: A Lot Help needed moving to and from a bed to a chair (including a wheelchair)?: A Lot Help needed standing up from a chair using your arms (e.g., wheelchair or bedside chair)?: A Lot Help needed to walk in hospital room?: A Lot Help needed climbing 3-5 steps with a railing? : A Lot 6 Click Score: 13    End of Session Equipment Utilized During Treatment: Oxygen  Activity Tolerance: Patient limited by pain Patient left: in bed;with call bell/phone within reach Nurse Communication: Mobility status PT Visit Diagnosis: Unsteadiness on feet (R26.81);Other abnormalities of gait and mobility (R26.89);Muscle weakness (generalized) (M62.81)    Time: 8966-8941 PT Time Calculation (min) (ACUTE ONLY): 25 min   Charges:   PT Evaluation $PT Eval Moderate Complexity: 1 Mod PT  Treatments $Therapeutic Activity: 23-37 mins PT General Charges $$ ACUTE PT VISIT: 1 Visit         1:47 PM, 05/12/24,  Tirzah Fross, SPT

## 2024-05-12 NOTE — TOC Initial Note (Addendum)
 Transition of Care [facility]) - Initial/Assessment Note    Patient Details  Name: Kathleen [name] MRN: [id number] Date of Birth: Oct 07, 1960  Transition of Care [facility]) CM/SW Contact:    Hoy [name], LCSW Phone Number: 05/12/2024, 11:30 AM  Clinical Narrative:                 Pt high risk for readmission. Pt admitted for acute CHF and anasarca. Pt from home w/ spouse. Pt's spouse provides transportation for pt to appointments. Pt requires 5L of O2 at baseline and receives home O2 through [facility]. Pt has cane, RW, wheelchair, BSC, and shower chair at home. Pt is active with [facility] for HHPT. Pt will need ROC orders placed prior to discharge. PT/OT to evaluate pt and pt shares she will not go to SNF if it is recommended but, would be interested in going to CIR. ICM will continue to follow.   Expected Discharge Plan: Home w Home Health Services Barriers to Discharge: Continued Medical Work up   Patient Goals and CMS Choice Patient states their goals for this hospitalization and ongoing recovery are:: To return home CMS Medicare.gov Compare Post Acute Care list provided to:: Patient Choice offered to / list presented to : Patient Kimberly ownership interest in [facility].provided to::  (NA)    Expected Discharge Plan and Services In-house Referral: Clinical Social Work Discharge Planning Services: NA Post Acute Care Choice: Home Health Living arrangements for the past 2 months: Single Family Home                 DME Arranged: N/A DME Agency: NA                  Prior Living Arrangements/Services Living arrangements for the past 2 months: Single Family Home Lives with:: Spouse Patient language and need for interpreter reviewed:: Yes Do you feel safe going back to the place where you live?: Yes      Need for Family Participation in Patient Care: No (Comment) Care giver support system in place?: Yes (comment) Current home services: DME, Home PT (Cane, RW,  wheelchair, BSC, shower chair, O2 w/ [employer]. HHPT w/ [facility]) Criminal Activity/Legal Involvement Pertinent to Current Situation/Hospitalization: No - Comment as needed  Activities of Daily Living   ADL Screening (condition at time of admission) Independently performs ADLs?: No Does the patient have a NEW difficulty with bathing/dressing/toileting/self-feeding that is expected to last >3 days?: Yes (Initiates electronic notice to provider for possible OT consult) Does the patient have a NEW difficulty with getting in/out of bed, walking, or climbing stairs that is expected to last >3 days?: Yes (Initiates electronic notice to provider for possible PT consult) Does the patient have a NEW difficulty with communication that is expected to last >3 days?: No Is the patient deaf or have difficulty hearing?: No Does the patient have difficulty seeing, even when wearing glasses/contacts?: No Does the patient have difficulty concentrating, remembering, or making decisions?: No  Permission Sought/Granted   Permission granted to share information with : No              Emotional Assessment Appearance:: Appears older than stated age Attitude/Demeanor/Rapport: Engaged Affect (typically observed): Accepting Orientation: : Oriented to Self, Oriented to Place, Oriented to  Time, Oriented to Situation Alcohol / Substance Use: Not Applicable Psych Involvement: No (comment)  Admission diagnosis:  Urinary retention [R33.9] Anasarca [R60.1] Exertional dyspnea [R06.09] History of interstitial lung disease [Z87.09] Acute on chronic congestive heart  failure [facility]) [I50.9] Patient Active Problem List   Diagnosis Date Noted   Acute on chronic diastolic CHF (congestive heart failure) (HCC) 05/11/2024   UTI (urinary tract infection) 04/23/2024   Osteoporosis with current pathological fracture 03/17/2024   Anxiety 02/11/2024   Spinal compression fracture (HCC) 01/29/2024   Lower back pain 01/26/2024    Closed compression fracture of L3 lumbar vertebra, initial encounter (HCC) 01/21/2024   Intractable low back pain 01/03/2024   Ambulatory dysfunction 12/11/2023   Uncontrolled pain 12/08/2023   Pressure injury of skin 12/08/2023   Intractable back pain 12/07/2023   Prediabetes 10/28/2023   Ankle fracture, bimalleolar, closed, left, sequela 03/10/2023   Closed fracture of left ankle 03/10/2023   Syndesmotic disruption of ankle, left, initial encounter 03/10/2023   Lumbar transverse process fracture (HCC) 01/02/2023   Acute on chronic hypoxic respiratory failure (HCC) 12/13/2022   Depression, major, single episode, moderate (HCC) 09/10/2022   Aortic atherosclerosis (HCC) 09/10/2022   Chronic respiratory failure with hypoxia (HCC) 05/22/2021   Anxiety and depression 05/22/2021   Vitamin D  deficiency 05/09/2021   Left foot drop 01/09/2021   Pulmonary HTN (HCC) 10/12/2020   Encounter for long-term opiate analgesic use 02/02/2020   Interstitial lung disease (HCC) 04/14/2019   GERD (gastroesophageal reflux disease) 10/15/2017   Personal history of noncompliance with medical treatment, presenting hazards to health 04/26/2017   Hypothyroidism 01/15/2017   Adrenal insufficiency (Addison's disease) (HCC) 08/22/2016   Symptomatic bradycardia    Adrenal insufficiency (HCC) 08/17/2016   Hypokalemia 08/17/2016   Chronic pain syndrome 04/20/2016   Irritable bowel syndrome with constipation 11/14/2015   Avascular necrosis of bone of hip, left (HCC) 04/05/2015   Post herpetic neuralgia 10/22/2014   Mixed hyperlipidemia 08/25/2014   Obstructive sleep apnea 02/18/2014   Fibromyalgia 02/11/2013   Bilateral lower extremity edema 01/22/2013   Hypotension 01/16/2013   Fibrocystic breast disease in female 04/09/2012   Sarcoidosis 06/21/2008   PCP:  Alphonsa Glendia LABOR, MD Pharmacy:   [facility] [address] [place] [address] Phone: [phone] Fax:  [phone]  [place] [facility] [address] [place] [address] Phone: [phone] Fax: [phone]  [facility] [address] [place] [address] Phone: [phone] Fax: [phone]     Social Drivers of Health (SDOH) Social History: SDOH Screenings   Food Insecurity: No Food Insecurity (05/11/2024)  Housing: Low Risk  (05/11/2024)  Transportation Needs: No Transportation Needs (05/11/2024)  Utilities: Not At Risk (05/11/2024)  Alcohol Screen: Low Risk  (09/27/2023)  Depression (PHQ2-9): Medium Risk (04/23/2024)  Financial Resource Strain: Low Risk  (09/27/2023)  Physical Activity: Inactive (09/27/2023)  Social Connections: Socially Integrated (01/21/2024)  Stress: No Stress Concern Present (09/27/2023)  Tobacco Use: Low Risk  (05/11/2024)  Health Literacy: Medium Risk (12/20/2023)   Received from [facility]   SDOH Interventions:     Readmission Risk Interventions    05/12/2024   11:28 AM 04/27/2024    9:59 AM 01/27/2024   11:48 AM  Readmission Risk Prevention Plan  Transportation Screening Complete Complete Complete  Medication Review [employer]) Complete Complete Complete  PCP or Specialist appointment within 3-5 days of discharge Complete    HRI or Home Care Consult Complete Complete Complete  SW Recovery Care/Counseling Consult Complete Complete Complete  Palliative Care Screening Not Applicable Not Applicable Not Applicable  Skilled Nursing Facility Not Applicable Not Applicable Patient Refused

## 2024-05-12 NOTE — Progress Notes (Incomplete)
      K 3.6 BUN 5 Cr 0.70 BNP 53 Mg 2.2 Trop 5-->5 EKG CXR: stable small left pleural effusion, +vascular congestion EKG NSR   Assessment/Plan  1.Acute on chronic HFpEF - CXR similar left small effusion, +pulmonary congestion. BNP 53 -from notes up 22 lbs from recent discharge, difficult to assess accuracy given bed weigths. There is a documented standing weight of 250 lbs, admission weight listed at 272-->263 lbs  -03/2024 Echo reviewed. LVEF 60-65%, no WMAs. Mild flattening of ventricular septum suggesting some degree of RV pressure overload. Normal LA and RA sizes. Indeteriminate diastolic function, tissue e' velocity medial and lateral mildly decreased however fairly normal MV inflow patter. E/e' average is borderline elevated. Normal LA would argue against severe dysfunction. RV has normal size and function. TR jet insufficent to estimate PASP. RA pressure 8 mmHg.  - has had elevated PASP by echo in the past however this was in the setting of volume overload (07/2016 echo PASP in the 50s).   - received IV lasix  80mg  x 1 yesterday, due for 40mg  bid today. Incomplete I/Os, negative roughly 4.4 L overnight. Renal function is stable, continue IV diuresis - consider refer to HF clinic as outpatient to determine if warrants further evaluation into possible cardiac sarcoid. No significant arhryhtmias thus far on admission.  - recent admit with UTI and urinary retention. Recurrent UTIs have been an issue, avoid SGLT2i   2.Pulmonary sarcoid/ILD - chronically on 5L [place] at home - followed by [facility] pulmonary - I do not see any prior testing for cardiac sarcoid involvement  3. Chronic pain syndrome   4. OSA - not using cpap  5. Adrenal insufficiency/Chronic hypotension - she is on steroids, also midodrine  as outpatient.

## 2024-05-12 NOTE — Plan of Care (Signed)
  Problem: Acute Rehab OT Goals (only OT should resolve) Goal: Pt. Will Perform Upper Body Bathing Flowsheets (Taken 05/12/2024 1400) Pt Will Perform Upper Body Bathing:  with modified independence  with set-up Goal: Pt. Will Perform Upper Body Dressing Flowsheets (Taken 05/12/2024 1400) Pt Will Perform Upper Body Dressing:  with modified independence  with set-up Goal: Pt. Will Perform Lower Body Dressing Flowsheets (Taken 05/12/2024 1400) Pt Will Perform Lower Body Dressing:  with min assist  sitting/lateral leans Goal: Pt. Will Transfer To Toilet Flowsheets (Taken 05/12/2024 1400) Pt Will Transfer to Toilet:  with supervision  ambulating Goal: Pt. Will Perform Toileting-Clothing Manipulation Flowsheets (Taken 05/12/2024 1400) Pt Will Perform Toileting - Clothing Manipulation and hygiene:  with supervision  sit to/from stand  sitting/lateral leans Goal: Pt/Caregiver Will Perform Home Exercise Program Flowsheets (Taken 05/12/2024 1400) Pt/caregiver will Perform Home Exercise Program:  Increased strength  Both right and left upper extremity  Independently  Carena Stream OT, MOT

## 2024-05-13 DIAGNOSIS — I272 Pulmonary hypertension, unspecified: Secondary | ICD-10-CM | POA: Diagnosis not present

## 2024-05-13 DIAGNOSIS — D869 Sarcoidosis, unspecified: Secondary | ICD-10-CM | POA: Diagnosis not present

## 2024-05-13 DIAGNOSIS — J9611 Chronic respiratory failure with hypoxia: Secondary | ICD-10-CM | POA: Diagnosis not present

## 2024-05-13 DIAGNOSIS — I5033 Acute on chronic diastolic (congestive) heart failure: Secondary | ICD-10-CM | POA: Diagnosis not present

## 2024-05-13 LAB — BASIC METABOLIC PANEL WITH GFR
Anion gap: 9 (ref 5–15)
Anion gap: 13 (ref 5–15)
BUN: 15 mg/dL (ref 8–23)
BUN: 20 mg/dL (ref 8–23)
CO2: 34 mmol/L — ABNORMAL HIGH (ref 22–32)
CO2: 31 mmol/L (ref 22–32)
Calcium: 8.8 mg/dL — ABNORMAL LOW (ref 8.9–10.3)
Calcium: 8.4 mg/dL — ABNORMAL LOW (ref 8.9–10.3)
Chloride: 102 mmol/L (ref 98–111)
Chloride: 98 mmol/L (ref 98–111)
Creatinine, Ser: 0.9 mg/dL (ref 0.44–1.00)
Creatinine, Ser: 0.87 mg/dL (ref 0.44–1.00)
GFR, Estimated: >60 mL/min (ref >60)
GFR, Estimated: >60 mL/min (ref >60)
Glucose, Bld: 160 mg/dL — ABNORMAL HIGH (ref 70–99)
Glucose, Bld: 138 mg/dL — ABNORMAL HIGH (ref 70–99)
Potassium: 4 mmol/L (ref 3.5–5.1)
Potassium: 3.9 mmol/L (ref 3.5–5.1)
Sodium: 145 mmol/L (ref 135–145)
Sodium: 142 mmol/L (ref 135–145)

## 2024-05-13 LAB — CBC WITH DIFFERENTIAL/PLATELET
Abs Immature Granulocytes: 0.15 K/uL — ABNORMAL HIGH (ref 0.00–0.07)
Basophils Absolute: 0.1 K/uL (ref 0.0–0.1)
Basophils Relative: 1 %
Eosinophils Absolute: 0.3 K/uL (ref 0.0–0.5)
Eosinophils Relative: 2 %
HCT: 30 % — ABNORMAL LOW (ref 36.0–46.0)
Hemoglobin: 8.7 g/dL — ABNORMAL LOW (ref 12.0–15.0)
Immature Granulocytes: 1 %
Lymphocytes Relative: 14 %
Lymphs Abs: 1.7 K/uL (ref 0.7–4.0)
MCH: 26.9 pg (ref 26.0–34.0)
MCHC: 29 g/dL — ABNORMAL LOW (ref 30.0–36.0)
MCV: 92.9 fL (ref 80.0–100.0)
Monocytes Absolute: 1 K/uL (ref 0.1–1.0)
Monocytes Relative: 8 %
Neutro Abs: 8.8 K/uL — ABNORMAL HIGH (ref 1.7–7.7)
Neutrophils Relative %: 74 %
Platelets: 494 K/uL — ABNORMAL HIGH (ref 150–400)
RBC: 3.23 MIL/uL — ABNORMAL LOW (ref 3.87–5.11)
RDW: 17.5 % — ABNORMAL HIGH (ref 11.5–15.5)
WBC: 11.9 K/uL — ABNORMAL HIGH (ref 4.0–10.5)
nRBC: 0 % (ref 0.0–0.2)

## 2024-05-13 MED ORDER — BUDESONIDE 0.5 MG/2ML IN SUSP
0.5000 mg | Freq: Two times a day (BID) | RESPIRATORY_TRACT | Status: DC
  Administered 2024-05-13 – 2024-05-23 (×20): 0.5 mg via RESPIRATORY_TRACT
  Filled 2024-05-13 (×21): qty 2

## 2024-05-13 MED ORDER — METHYLPREDNISOLONE SODIUM SUCC 125 MG IJ SOLR
125.0000 mg | Freq: Once | INTRAMUSCULAR | Status: AC
  Administered 2024-05-13: 125 mg via INTRAVENOUS
  Filled 2024-05-13: qty 2

## 2024-05-13 NOTE — Progress Notes (Signed)
 PROGRESS NOTE    PERSEPHONE SCHRIEVER  FMW:[id number] DOB: [birth year] DOA: 05/11/2024 PCP: Alphonsa Glendia LABOR, MD   Brief Narrative:  63 year old female with history of chronic diastolic heart failure, paroxysmal A-fib, sarcoidosis, chronic respiratory failure on oxygen , adrenal insufficiency, chronic pain presented with worsening shortness of breath and swelling and was found to have acute on chronic heart failure.  She was started on IV diuretics.  Cardiology was consulted.  Assessment & Plan:   Acute on chronic diastolic heart failure - Echo from 04/24/2024 showed EF of 60 to 65% with grade 1 diastolic dysfunction. -Patient apparently gained more than 20 pound weight since her recent discharge - Cardiology following.  Diuretics as per cardiology.  Patient is adamant about needing a heart cath.  Will let cardiology decide regarding the same. - Strict input output.  Daily weights.  Fluid restriction.  Negative balance of 7035 cc since admission.  Chronic respiratory failure with hypoxia Interstitial lung disease/sarcoidosis - Respiratory status currently stable.  On 5 L oxygen  via nasal cannula as an outpatient.  Continue current nebs.  Depression/anxiety - Continue BuSpar .  Previously on Cymbalta : She stopped taking it.  Adrenal insufficiency -Chronic.  Continue Cortef  and midodrine .  Received IV Solu-Medrol  on admission  Hypothyroidism - Continue Synthroid   Chronic pain syndrome with opiate dependence -Will continue oxycodone  as needed.  Outpatient follow-up with PCP and/for pain management  OSA - Not on CPAP therapy  Obesity class II - Outpatient follow-up   DVT prophylaxis: Lovenox  Code Status: Full Family Communication: None at bedside Disposition Plan: Status is: Inpatient Remains inpatient appropriate because: Of severity of illness    Consultants: Cardiology  Procedures: None  Antimicrobials: None   Subjective: Patient seen and examined at bedside.  Still short  of breath with exertion.  Denies any current chest pain.  No fever or vomiting reported.  Objective: Vitals:   05/13/24 0700 05/13/24 0732 05/13/24 0752 05/13/24 0916  BP: (!) 107/38  (!) 103/40 116/61  Pulse: 87  87   Resp: 20  19   Temp: 98.2 F (36.8 C)  97.8 F (36.6 C)   TempSrc: Oral  Oral   SpO2: 96% 94% 95%   Weight:      Height:        Intake/Output Summary (Last 24 hours) at 05/13/2024 1025 Last data filed at 05/12/2024 2200 Gross per 24 hour  Intake 360 ml  Output 2950 ml  Net -2590 ml   Filed Weights   05/11/24 2043 05/12/24 0425 05/13/24 0440  Weight: 121.2 kg 119.6 kg 120 kg    Examination:  General exam: Appears calm and comfortable.  On 4 L oxygen  via nasal cannula. Respiratory system: Bilateral decreased breath sounds at bases with scattered crackles Cardiovascular system: S1 & S2 heard, Rate controlled Gastrointestinal system: Abdomen is obese, nondistended, soft and nontender. Normal bowel sounds heard. Extremities: No cyanosis, clubbing; bilateral lower extremity edema present Central nervous system: Alert and oriented. No focal neurological deficits. Moving extremities Skin: No rashes, lesions or ulcers Psychiatry: Flat affect.  Not agitated.   Data Reviewed: I have personally reviewed following labs and imaging studies  CBC: Recent Labs  Lab 05/11/24 1601 05/12/24 0416  WBC 9.2 8.4  NEUTROABS 6.8  --   HGB 8.6* 9.0*  HCT 29.4* 30.6*  MCV 92.2 90.5  PLT 456* 459*   Basic Metabolic Panel: Recent Labs  Lab 05/11/24 1601 05/11/24 1800 05/12/24 0416 05/13/24 0417  NA 142  --  143 145  K  3.6  --  4.7 4.0  CL 102  --  101 102  CO2 29  --  31 34*  GLUCOSE 122*  --  190* 160*  BUN 5*  --  8 15  CREATININE 0.70  --  0.68 0.90  CALCIUM  8.7*  --  8.7* 8.8*  MG  --  2.2  --   --    GFR: Estimated Creatinine Clearance: 92.3 mL/min (by C-G formula based on SCr of 0.9 mg/dL). Liver Function Tests: Recent Labs  Lab 05/11/24 1601  AST 22   ALT 19  ALKPHOS 64  BILITOT 0.5  PROT 5.9*  ALBUMIN 2.8*   Recent Labs  Lab 05/11/24 1601  LIPASE 26   No results for input(s): AMMONIA  in the last 168 hours. Coagulation Profile: No results for input(s): INR, PROTIME in the last 168 hours. Cardiac Enzymes: No results for input(s): CKTOTAL, CKMB, CKMBINDEX, TROPONINI in the last 168 hours. BNP (last 3 results) No results for input(s): PROBNP in the last 8760 hours. HbA1C: No results for input(s): HGBA1C in the last 72 hours. CBG: Recent Labs  Lab 05/11/24 2258 05/12/24 0417  GLUCAP 174* 186*   Lipid Profile: No results for input(s): CHOL, HDL, LDLCALC, TRIG, CHOLHDL, LDLDIRECT in the last 72 hours. Thyroid  Function Tests: No results for input(s): TSH, T4TOTAL, FREET4, T3FREE, THYROIDAB in the last 72 hours. Anemia Panel: No results for input(s): VITAMINB12, FOLATE, FERRITIN, TIBC, IRON , RETICCTPCT in the last 72 hours. Sepsis Labs: No results for input(s): PROCALCITON, LATICACIDVEN in the last 168 hours.  Recent Results (from the past 240 hours)  Urine Culture     Status: Abnormal   Collection Time: 05/05/24  2:42 PM   Specimen: Urine, Clean Catch   UR  Result Value Ref Range Status   Urine Culture, Routine Final report (A)  Final   Organism ID, Bacteria Enterococcus faecalis (A)  Final    Comment: Enterococci susceptible to penicillin  are predictably susceptible to ampicillin, amoxicillin , ampicillin-sulbactam, amoxicillin -clavulanate, and piperacillin -tazobactam for non-beta-lactamase producing enterococci. (CLSI 2018) For Enterococcus species, aminoglycosides (except for high-level resistance screening), cephalosporins, clindamycin , and trimethoprim -sulfamethoxazole  are not effective clinically. (CLSI, M100-S26, 2016) Greater than 100,000 colony forming units per mL    Antimicrobial Susceptibility Comment  Final    Comment:       ** S = Susceptible; I  = Intermediate; R = Resistant **                    P = Positive; N = Negative             MICS are expressed in micrograms per mL    Antibiotic                 RSLT#1    RSLT#2    RSLT#3    RSLT#4 Ciprofloxacin                   S Levofloxacin                    S Nitrofurantoin                 S Penicillin                      S Tetracycline                   S Vancomycin   S   Microscopic Examination     Status: Abnormal   Collection Time: 05/05/24  2:56 PM   Urine  Result Value Ref Range Status   WBC, UA 6-10 (A) 0 - 5 /hpf Final   RBC, Urine 0-2 0 - 2 /hpf Final   Epithelial Cells (non renal) 0-10 0 - 10 /hpf Final   Bacteria, UA None seen None seen/Few Final  Resp panel by RT-PCR (RSV, Flu A&B, Covid) Anterior Nasal Swab     Status: None   Collection Time: 05/11/24  4:05 PM   Specimen: Anterior Nasal Swab  Result Value Ref Range Status   SARS Coronavirus 2 by RT PCR NEGATIVE NEGATIVE Final    Comment: (NOTE) SARS-CoV-2 target nucleic acids are NOT DETECTED.  The SARS-CoV-2 RNA is generally detectable in upper respiratory specimens during the acute phase of infection. The lowest concentration of SARS-CoV-2 viral copies this assay can detect is 138 copies/mL. A negative result does not preclude SARS-Cov-2 infection and should not be used as the sole basis for treatment or other patient management decisions. A negative result may occur with  improper specimen collection/handling, submission of specimen other than nasopharyngeal swab, presence of viral mutation(s) within the areas targeted by this assay, and inadequate number of viral copies(<138 copies/mL). A negative result must be combined with clinical observations, patient history, and epidemiological information. The expected result is Negative.  Fact Sheet for Patients:  BloggerCourse.com  Fact Sheet for Healthcare Providers:  SeriousBroker.it  This  test is no t yet approved or cleared by the [place]  FDA and  has been authorized for detection and/or diagnosis of SARS-CoV-2 by FDA under an Emergency Use Authorization (EUA). This EUA will remain  in effect (meaning this test can be used) for the duration of the COVID-19 declaration under Section 564(b)(1) of the Act, 21 U.S.C.section 360bbb-3(b)(1), unless the authorization is terminated  or revoked sooner.       Influenza A by PCR NEGATIVE NEGATIVE Final   Influenza B by PCR NEGATIVE NEGATIVE Final    Comment: (NOTE) The Xpert Xpress SARS-CoV-2/FLU/RSV plus assay is intended as an aid in the diagnosis of influenza from Nasopharyngeal swab specimens and should not be used as a sole basis for treatment. Nasal washings and aspirates are unacceptable for Xpert Xpress SARS-CoV-2/FLU/RSV testing.  Fact Sheet for Patients: BloggerCourse.com  Fact Sheet for Healthcare Providers: SeriousBroker.it  This test is not yet approved or cleared by the [place]  FDA and has been authorized for detection and/or diagnosis of SARS-CoV-2 by FDA under an Emergency Use Authorization (EUA). This EUA will remain in effect (meaning this test can be used) for the duration of the COVID-19 declaration under Section 564(b)(1) of the Act, 21 U.S.C. section 360bbb-3(b)(1), unless the authorization is terminated or revoked.     Resp Syncytial Virus by PCR NEGATIVE NEGATIVE Final    Comment: (NOTE) Fact Sheet for Patients: BloggerCourse.com  Fact Sheet for Healthcare Providers: SeriousBroker.it  This test is not yet approved or cleared by the [place]  FDA and has been authorized for detection and/or diagnosis of SARS-CoV-2 by FDA under an Emergency Use Authorization (EUA). This EUA will remain in effect (meaning this test can be used) for the duration of the COVID-19 declaration under  Section 564(b)(1) of the Act, 21 U.S.C. section 360bbb-3(b)(1), unless the authorization is terminated or revoked.  Performed at [facility], [address] [place], [place] [address]          Radiology Studies: DG  CHEST PORT 1 VIEW Result Date: 05/12/2024 CLINICAL DATA:  Chest pain and shortness of breath. EXAM: PORTABLE CHEST 1 VIEW COMPARISON:  Radiograph yesterday FINDINGS: Unchanged cardiomediastinal contours. Increasing ill-defined opacity in the left hemithorax. Left pleural effusion may have increased. No visible pneumothorax. Stable osseous structures. IMPRESSION: Increasing ill-defined opacity in the left hemithorax, may represent worsening pneumonia or asymmetric edema. Left pleural effusion may have increased. Electronically Signed   By: Andrea Gasman M.D.   On: 05/12/2024 20:06   DG Chest Portable 1 View Result Date: 05/11/2024 CLINICAL DATA:  Bilateral lower extremity and abdominal swelling. History of CHF. EXAM: PORTABLE CHEST 1 VIEW COMPARISON:  05/01/2024. FINDINGS: The heart size and mediastinal contours are unchanged. No significant change in pulmonary vascular congestion. Similar small left pleural effusion with left basilar opacity. No pneumothorax. No acute osseous abnormality. IMPRESSION: 1. Similar small left pleural effusion with left basilar opacity. 2. Similar pulmonary vascular congestion. Electronically Signed   By: Harrietta Sherry M.D.   On: 05/11/2024 16:17        Scheduled Meds:  busPIRone   15 mg Oral TID   Chlorhexidine  Gluconate Cloth  6 each Topical Q0600   enoxaparin  (LOVENOX ) injection  60 mg Subcutaneous Q24H   furosemide   60 mg Intravenous Q12H   hydrocortisone   20 mg Oral Daily   And   hydrocortisone   10 mg Oral Daily   levothyroxine   50 mcg Oral QAC breakfast   linaclotide   145 mcg Oral QAC breakfast   LORazepam   1 mg Oral Once   midodrine   5 mg Oral BID WC   pantoprazole   40 mg Oral BID   polyethylene glycol  17 g Oral BID    revefenacin   175 mcg Nebulization Daily   rosuvastatin   20 mg Oral Daily   senna-docusate  2 tablet Oral QHS   topiramate   50 mg Oral Q lunch   Continuous Infusions:        Sophie Mao, MD [facility] 05/13/2024, 10:25 AM

## 2024-05-13 NOTE — Plan of Care (Signed)
  Problem: Clinical Measurements: Goal: Ability to maintain clinical measurements within normal limits will improve Outcome: Not Met (add Reason) Goal: Respiratory complications will improve Outcome: Not Met (add Reason)   Problem: Activity: Goal: Risk for activity intolerance will decrease Outcome: Not Met (add Reason)   Problem: Coping: Goal: Level of anxiety will decrease Outcome: Not Met (add Reason)

## 2024-05-13 NOTE — Plan of Care (Signed)

## 2024-05-13 NOTE — TOC Progression Note (Signed)
 Transition of Care [facility]) - Progression Note    Patient Details  Name: Kathleen [name] MRN: [id number] Date of Birth: 1961/04/20  Transition of Care [facility]) CM/SW Contact  Hoy [name], LCSW Phone Number: 05/13/2024, 11:05 AM  Clinical Narrative:    Met with pt to discuss PT evaluation and recommendation. Pt currently does not meet criteria for CIR. Pt reports if she is unable to go to CIR she will return home with home health. Pt currently active with [facility].     Expected Discharge Plan: Home w Home Health Services Barriers to Discharge: Continued Medical Work up               Expected Discharge Plan and Services In-house Referral: Clinical Social Work Discharge Planning Services: NA Post Acute Care Choice: Home Health Living arrangements for the past 2 months: Single Family Home                 DME Arranged: N/A DME Agency: NA                   Social Drivers of Health (SDOH) Interventions SDOH Screenings   Food Insecurity: No Food Insecurity (05/11/2024)  Housing: Low Risk  (05/11/2024)  Transportation Needs: No Transportation Needs (05/11/2024)  Utilities: Not At Risk (05/11/2024)  Alcohol Screen: Low Risk  (09/27/2023)  Depression (PHQ2-9): Medium Risk (04/23/2024)  Financial Resource Strain: Low Risk  (09/27/2023)  Physical Activity: Inactive (09/27/2023)  Social Connections: Socially Integrated (01/21/2024)  Stress: No Stress Concern Present (09/27/2023)  Tobacco Use: Low Risk  (05/11/2024)  Health Literacy: Medium Risk (12/20/2023)   Received from [facility]    Readmission Risk Interventions    05/12/2024   11:28 AM 04/27/2024    9:59 AM 01/27/2024   11:48 AM  Readmission Risk Prevention Plan  Transportation Screening Complete Complete Complete  Medication Review Oceanographer) Complete Complete Complete  PCP or Specialist appointment within 3-5 days of discharge Complete    HRI or Home Care Consult Complete Complete Complete  SW Recovery  Care/Counseling Consult Complete Complete Complete  Palliative Care Screening Not Applicable Not Applicable Not Applicable  Skilled Nursing Facility Not Applicable Not Applicable Patient Refused

## 2024-05-13 NOTE — Progress Notes (Addendum)
 Progress Note  Patient Name: Kathleen [name] Date of Encounter: 05/13/2024  [facility] Cardiologist:Jonathan Alvan, MD  Patient Profile     Subjective   Still complains of significant shortness of breath.  Denies any CP.  [facility]xious to get to [facility] for [facility].  O2 sats 95% on 4 L  Inpatient Medications    Scheduled Meds:  busPIRone   15 mg Oral TID   Chlorhexidine  Gluconate Cloth  6 each Topical Q0600   enoxaparin  (LOVENOX ) injection  60 mg Subcutaneous Q24H   furosemide   60 mg Intravenous Q12H   hydrocortisone   20 mg Oral Daily   And   hydrocortisone   10 mg Oral Daily   levothyroxine   50 mcg Oral QAC breakfast   linaclotide   145 mcg Oral QAC breakfast   LORazepam   1 mg Oral Once   midodrine   5 mg Oral BID WC   pantoprazole   40 mg Oral BID   polyethylene glycol  17 g Oral BID   revefenacin   175 mcg Nebulization Daily   rosuvastatin   20 mg Oral Daily   senna-docusate  2 tablet Oral QHS   topiramate   50 mg Oral Q lunch   Continuous Infusions:  PRN Meds: acetaminophen  **OR** acetaminophen , albuterol , alum & mag hydroxide-simeth, HYDROmorphone  (DILAUDID ) injection, hydrOXYzine , ketorolac , nitroGLYCERIN , ondansetron  **OR** ondansetron  (ZOFRAN ) IV, oxyCODONE    Vital Signs    Vitals:   05/13/24 0700 05/13/24 0732 05/13/24 0752 05/13/24 0916  BP: (!) 107/38  (!) 103/40 116/61  Pulse: 87  87   Resp: 20  19   Temp: 98.2 F (36.8 C)  97.8 F (36.6 C)   TempSrc: Oral  Oral   SpO2: 96% 94% 95%   Weight:      Height:        Intake/Output Summary (Last 24 hours) at 05/13/2024 9076 Last data filed at 05/12/2024 2200 Gross per 24 hour  Intake 360 ml  Output 2950 ml  Net -2590 ml      05/13/2024    4:40 AM 05/12/2024    4:25 AM 05/11/2024    8:43 PM  Last 3 Weights  Weight (lbs) 264 lb 8.8 oz 263 lb 10.7 oz 267 lb 3.2 oz  Weight (kg) 120 kg 119.6 kg 121.2 kg      Telemetry    NSR - Personally Reviewed  ECG     No new EKG to review - Personally  Reviewed  Physical Exam   GEN: Well nourished, well developed in no acute distress HEENT: Normal NECK: Cannot assess due to body habitus; No carotid bruits LYMPHATICS: No lymphadenopathy CARDIAC:RRR, no murmurs, rubs, gallops RESPIRATORY: Scattered wheezes posterior and anterior ABDOMEN: Soft, non-tender, non-distended MUSCULOSKELETAL:  No edema; No deformity  SKIN: Warm and dry NEUROLOGIC:  Alert and oriented x 3 PSYCHIATRIC:  Normal affect   Labs    High Sensitivity Troponin:   Recent Labs  Lab 05/11/24 1601 05/11/24 1800 05/12/24 2021  TROPONINIHS 5 5 3       Chemistry Recent Labs  Lab 05/11/24 1601 05/12/24 0416 05/13/24 0417  NA 142 143 145  K 3.6 4.7 4.0  CL 102 101 102  CO2 29 31 34*  GLUCOSE 122* 190* 160*  BUN 5* 8 15  CREATININE 0.70 0.68 0.90  CALCIUM  8.7* 8.7* 8.8*  PROT 5.9*  --   --   ALBUMIN 2.8*  --   --   AST 22  --   --   ALT 19  --   --  ALKPHOS 64  --   --   BILITOT 0.5  --   --   GFRNONAA >60 >60 >60  ANIONGAP 11 11 9      Hematology Recent Labs  Lab 05/11/24 1601 05/12/24 0416  WBC 9.2 8.4  RBC 3.19* 3.38*  HGB 8.6* 9.0*  HCT 29.4* 30.6*  MCV 92.2 90.5  MCH 27.0 26.6  MCHC 29.3* 29.4*  RDW 17.2* 17.1*  PLT 456* 459*    BNP Recent Labs  Lab 05/11/24 1601  BNP 53.0     Radiology    DG CHEST PORT 1 VIEW Result Date: 05/12/2024 CLINICAL DATA:  Chest pain and shortness of breath. EXAM: PORTABLE CHEST 1 VIEW COMPARISON:  Radiograph yesterday FINDINGS: Unchanged cardiomediastinal contours. Increasing ill-defined opacity in the left hemithorax. Left pleural effusion may have increased. No visible pneumothorax. Stable osseous structures. IMPRESSION: Increasing ill-defined opacity in the left hemithorax, may represent worsening pneumonia or asymmetric edema. Left pleural effusion may have increased. Electronically Signed   By: Andrea Gasman M.D.   On: 05/12/2024 20:06   DG Chest Portable 1 View Result Date:  05/11/2024 CLINICAL DATA:  Bilateral lower extremity and abdominal swelling. History of CHF. EXAM: PORTABLE CHEST 1 VIEW COMPARISON:  05/01/2024. FINDINGS: The heart size and mediastinal contours are unchanged. No significant change in pulmonary vascular congestion. Similar small left pleural effusion with left basilar opacity. No pneumothorax. No acute osseous abnormality. IMPRESSION: 1. Similar small left pleural effusion with left basilar opacity. 2. Similar pulmonary vascular congestion. Electronically Signed   By: Harrietta Sherry M.D.   On: 05/11/2024 16:17    Patient Profile     63 y.o. female with a hx of congestive heart failure, transient bradycardia and PAF in the setting of acute illness, sarcoidosis, chronic respiratory failure on 5 L, adrenal insufficiency, chronic pain. who is being seen 05/12/2024 for the evaluation of HF at the request of Dr. Briana.   Assessment & Plan    Acute on chronic HFpEF Echo 04/24/2024: EF 60 to 65%, mild LVH, G1DD, elevated LVEDP, mildly enlarged RV. Presents with worsening dyspnea, swelling in face/ abdomen/ LE, and 29lb weight gain (243 >272) since recent hospitalization.  X-ray shows pulmonary vascular congestion.  BNP-53. Likely not reflective of volume status due to body wt and preserved EF.  Treated with IV Lasix  80 mg x1, IV Lasix  40 mg x1.  Net I/O -7.4L (put out 2.9L yesterday with increased Lasix  dose), wt 273 lbs > 264 lbs, Cr 0.7 > 0.68> 0.90, K+ 4 Remains volume overloaded.  She has diffuse wheezing which could be cardiac wheezing but suspect due to combination of underlying lung disease as with ILD as well as volume overload Continue Lasix  60mg  IV BID She had a recent admission for UTI with urinary retention so would avoid SGLT2i As above, patient is adamant about being transferred to [facility] for catheterization per pulmonology request.  Can consider RHC with history of ILD/pulmonary hypertension/sarcoidosis. Plan to transfer to [facility] for  cath once volume status has improved>> currently she cannot lie flat for cardiac cath   Chest pain  Lexiscan  Myoview  08/08/2011: overall normal but suggestive of elevated right side pressure.  Patient reports ongoing constant worsening mix of typical and typical chest pain since last hospitalization.  Less concern for ischemic cause with negative troponins and EKG without ischemic changes.   Patient was not open to discussing stress test at this time because adamant about cath as she was told she would have a cath  at [facility] Suspect chest pain secondary to underlying pulmonary etiology.   OSA  Not complaint with CPAP    Chronic Resp. Failure with Hypoxia  ILD  Sarcoidosis  Pulmonary HTN She is chronically on 5L  at home Followed by [facility] Managed by primary team   Adrenal Insufficiency -Chronically on steroids and Midodrine  at home  I spent 30 minutes caring for this patient today face to face, ordering and reviewing labs, reviewing records from 2D echo this admit, seeing the patient, documenting in the record  For questions or updates, please contact [facility] Please consult [email or web address] for contact info under        Signed, Wilbert Bihari, MD  05/13/2024, 9:23 AM

## 2024-05-14 DIAGNOSIS — I5033 Acute on chronic diastolic (congestive) heart failure: Secondary | ICD-10-CM | POA: Diagnosis not present

## 2024-05-14 LAB — BASIC METABOLIC PANEL WITH GFR
Anion gap: 11 (ref 5–15)
BUN: 20 mg/dL (ref 8–23)
CO2: 31 mmol/L (ref 22–32)
Calcium: 8.5 mg/dL — ABNORMAL LOW (ref 8.9–10.3)
Chloride: 98 mmol/L (ref 98–111)
Creatinine, Ser: 0.86 mg/dL (ref 0.44–1.00)
GFR, Estimated: >60 mL/min (ref >60)
Glucose, Bld: 272 mg/dL — ABNORMAL HIGH (ref 70–99)
Potassium: 4.3 mmol/L (ref 3.5–5.1)
Sodium: 140 mmol/L (ref 135–145)

## 2024-05-14 LAB — MAGNESIUM: Magnesium: 2.3 mg/dL (ref 1.7–2.4)

## 2024-05-14 MED ORDER — MUSCLE RUB 10-15 % EX CREA
TOPICAL_CREAM | CUTANEOUS | Status: DC | PRN
  Administered 2024-05-14 – 2024-05-22 (×2): 1 via TOPICAL
  Filled 2024-05-14: qty 85

## 2024-05-14 NOTE — Progress Notes (Signed)
 Report called to [facility] at [facility]. [facility] to be called per Diplomatic Services operational officer. Patient aware of transfer order.

## 2024-05-14 NOTE — Plan of Care (Signed)

## 2024-05-14 NOTE — Consult Note (Incomplete)
 Advanced Heart Failure Team Consult Note   Primary Physician: Alphonsa Glendia LABOR, MD Cardiologist:  None  Reason for Consultation: A/C HFpEF  HPI:    Kathleen [name] is seen today for evaluation of Heart Failure/Pulmonary Sarciod at the request of Dr Alvan. .   63 year old with a history of HFpEF, bradycardia, PAF, pulmonary hypertension, adrenal insufficiency, OSA, urinary retention, and pulmonary sarciod confirmed with biopsy.  Previously on prednisone  but had AVN of hips and multiple compression fracture. Of note  left femoral head avascular necrosis was evaluated by ortho and not a candidate for surgery. Since that time she has been severely limited. She is bedbound except for [facility]. Her husband provides 24 hour care. Her husband performs in and out cath as needed.   Admitted to [facility] 04/23/24 with  UTI and A/C HFpEF. Discharged on lasix  20 mg daily.   Admitted to [facility] with A/C HFpEF. Diuresed with IV lasix  with excellent results. Echo LV  60-65% RV normal. Grade IDD.Transfer to [facility] for advanced heart failure consult & RHC. Renal function has been stable.    Objective:    Vital Signs:   Temp:  [97.8 F (36.6 C)-98.3 F (36.8 C)] 97.8 F (36.6 C) (09/19 1058) Pulse Rate:  [74-105] 84 (09/19 1058) Resp:  [20-24] 20 (09/19 1058) BP: (121-148)/(47-66) 121/65 (09/19 1058) SpO2:  [94 %-99 %] 98 % (09/19 1058) Weight:  [116 kg-118.9 kg] 116 kg (09/19 0456) Last BM Date : 05/14/24  Weight change: Filed Weights   05/13/24 0440 05/14/24 2054 05/15/24 0456  Weight: 120 kg 118.9 kg 116 kg    Intake/Output:   Intake/Output Summary (Last 24 hours) at 05/15/2024 1127 Last data filed at 05/15/2024 0455 Gross per 24 hour  Intake 690 ml  Output 9510 ml  Net -8820 ml      Physical Exam    General:   No resp difficulty. In bed.  Neck: no JVD.  Cor: Regular rate & rhythm. Lungs: Coarse throughout. On 5 liters  Abdomen: soft, nontender, nondistended.  Extremities: no  edema Neuro:  alert & oriented x3. Affect flat  Telemetry   SR  EKG    ST on admit   Labs   Basic Metabolic Panel: Recent Labs  Lab 05/11/24 1800 05/12/24 0416 05/13/24 0417 05/13/24 1847 05/14/24 0410 05/15/24 0244 05/15/24 0753  NA  --    < > 145 142 140 144 143  K  --    < > 4.0 3.9 4.3 3.8 3.8  CL  --    < > 102 98 98 96* 99  CO2  --    < > 34* 31 31 33* 32  GLUCOSE  --    < > 160* 138* 272* 116* 109*  BUN  --    < > 15 20 20 18 19   CREATININE  --    < > 0.90 0.87 0.86 0.69 0.81  CALCIUM   --    < > 8.8* 8.4* 8.5* 9.0 9.0  MG 2.2  --   --   --  2.3 2.4 2.3   < > = values in this interval not displayed.    Liver Function Tests: Recent Labs  Lab 05/11/24 1601  AST 22  ALT 19  ALKPHOS 64  BILITOT 0.5  PROT 5.9*  ALBUMIN 2.8*   Recent Labs  Lab 05/11/24 1601  LIPASE 26   No results for input(s): AMMONIA  in the last 168 hours.  CBC: Recent Labs  Lab 05/11/24 1601 05/12/24 0416 05/13/24 1847 05/15/24 0753  WBC 9.2 8.4 11.9* 11.4*  NEUTROABS 6.8  --  8.8*  --   HGB 8.6* 9.0* 8.7* 9.5*  HCT 29.4* 30.6* 30.0* 32.7*  MCV 92.2 90.5 92.9 90.6  PLT 456* 459* 494* 485*    Cardiac Enzymes: No results for input(s): CKTOTAL, CKMB, CKMBINDEX, TROPONINI in the last 168 hours.  BNP: BNP (last 3 results) Recent Labs    04/23/24 1236 04/29/24 1154 05/11/24 1601  BNP 36.0 31.0 53.0    ProBNP (last 3 results) No results for input(s): PROBNP in the last 8760 hours.   CBG: Recent Labs  Lab 05/11/24 2258 05/12/24 0417  GLUCAP 174* 186*    Coagulation Studies: No results for input(s): LABPROT, INR in the last 72 hours.    Medications:     Current Medications:  budesonide  (PULMICORT ) nebulizer solution  0.5 mg Nebulization BID   busPIRone   15 mg Oral TID   Chlorhexidine  Gluconate Cloth  6 each Topical Q0600   enoxaparin  (LOVENOX ) injection  60 mg Subcutaneous Q24H   hydrocortisone   20 mg Oral Daily   And   hydrocortisone   10 mg Oral  Daily   levothyroxine   50 mcg Oral QAC breakfast   linaclotide   145 mcg Oral QAC breakfast   midodrine   5 mg Oral BID WC   pantoprazole   40 mg Oral BID   polyethylene glycol  17 g Oral BID   revefenacin   175 mcg Nebulization Daily   rosuvastatin   20 mg Oral Daily   senna-docusate  2 tablet Oral QHS   topiramate   50 mg Oral Q lunch    Infusions:     Patient Profile   63 year old with a history of HFpEF, bradycardia, PAF, pulmonary hypertension, adrenal insufficiency, AVN, compression fractures, and chronic pain.   Admitted to [facility] with A/C HFpEF   Assessment/Plan   # A/C HFpEF Echo LVEF 60-65% RV normal. Grade I DD.  Admitted last month with A/C HFpEF.  Diuresing with IV lasix . Overall negative 21 liters. Continue IV lasix  and adjust after RHC.  Renal stable. She has been on midodrine  5 mg twice a day.   #Pulmonary Sarcoid/ILD #Chronic Hypoxic Respiratory Failure, on 5 liters [place] Previously on prednisone  but stopped with multiple compression fractures and AVN.  RHC today to further assess for [facility] Informed Consent   Shared Decision Making/Informed Consent The risks, including but not limited to, [bleeding or vascular complications (1 in 500), pneumothorax (1 in 1600), arrhythmia (1 in 1000) and death (1 in 5000)], benefits (diagnostic support and/or management of heart failure, pulmonary hypertension) and alternatives of a right heart catheterization were discussed in detail with Kathleen [name] and she is willing to proceed.     #Chronic Pain #Compression Fractures  On Oxy 5 times a day.       Length of Stay: 4  Kathleen Mosses, NP  05/15/2024, 11:27 AM   Patient seen with PA/NP, agree with the above note.   Subjective:   Patient seen lying in bed, arouses to voice but poorly alert, not overly participating in conversation. Does not appear to have received any sedating medications recently. She is severely limited at baseline, bedbound for the most part and requires 24 hour  care. Majority of her limitations are due to complications of long term steroid use for her pulmonary sarcoid, unfortunately also has issues tolerating immunosuppression.  She presented to [facility] with acute on chronic volume overload. Has been diuresing well  but still extremely limited.   Exam: General: Chronically ill appearing Lungs: Clear to auscultation bilaterally with normal respiratory effort. CV: Heart regular S1/S2, no murmur.  2+ peripheral edema. JVP moderately elevated Abdomen: Soft, no distention.  Neurologic: awakens to voice, extremely weak   A/P  Difficult case. Patient with long term history of pulmonary sarcoid as well as suspected HFpEF. Has multiple complications from long term steroid use and is bedbound. Volume status difficult, but still appears volume up.  Right heart catheterization would be helpful for PH classification, but based on review of her echo at least has some post capillary disease. The benefit with inhaled tyvaso, the treatment for the precapillary component due to Group III, sees benefit in 6 minute walk test. Given her immobility, RHC is more an academic exercise at this point. If significantly improved come Monday can consider.   Similar feelings about her workup for cardiac sarcoid. Active sarcoid does not typically present with acute on chronic HFpEF, and given that she does not tolerate immunosuppression limited benefit to placing back on steroids.   In the interim, will continue IV diuresis with lasix  80mg  BID, appears volume up. Would recommend a palliative care consultation especially given multiple recent admits.   Kathleen [name] Advanced Heart Failure

## 2024-05-14 NOTE — Progress Notes (Signed)
 Rounding Note   Patient Name: Kathleen [name] Date of Encounter: 05/14/2024  [facility] Cardiologist: New  Subjective SOB is improving.   Scheduled Meds:  budesonide  (PULMICORT ) nebulizer solution  0.5 mg Nebulization BID   busPIRone   15 mg Oral TID   Chlorhexidine  Gluconate Cloth  6 each Topical Q0600   enoxaparin  (LOVENOX ) injection  60 mg Subcutaneous Q24H   furosemide   60 mg Intravenous Q12H   hydrocortisone   20 mg Oral Daily   And   hydrocortisone   10 mg Oral Daily   levothyroxine   50 mcg Oral QAC breakfast   linaclotide   145 mcg Oral QAC breakfast   LORazepam   1 mg Oral Once   midodrine   5 mg Oral BID WC   pantoprazole   40 mg Oral BID   polyethylene glycol  17 g Oral BID   revefenacin   175 mcg Nebulization Daily   rosuvastatin   20 mg Oral Daily   senna-docusate  2 tablet Oral QHS   topiramate   50 mg Oral Q lunch   Continuous Infusions:  PRN Meds: acetaminophen  **OR** acetaminophen , albuterol , alum & mag hydroxide-simeth, HYDROmorphone  (DILAUDID ) injection, hydrOXYzine , ketorolac , Muscle Rub, nitroGLYCERIN , ondansetron  **OR** ondansetron  (ZOFRAN ) IV, oxyCODONE    Vital Signs  Vitals:   05/13/24 1140 05/13/24 1340 05/13/24 1812 05/13/24 2116  BP: (!) 113/58 126/61    Pulse: 80 85    Resp:      Temp:      TempSrc:      SpO2: 96%  95% 97%  Weight:      Height:        Intake/Output Summary (Last 24 hours) at 05/14/2024 1009 Last data filed at 05/14/2024 0930 Gross per 24 hour  Intake --  Output 6350 ml  Net -6350 ml      05/13/2024    4:40 AM 05/12/2024    4:25 AM 05/11/2024    8:43 PM  Last 3 Weights  Weight (lbs) 264 lb 8.8 oz 263 lb 10.7 oz 267 lb 3.2 oz  Weight (kg) 120 kg 119.6 kg 121.2 kg      Telemetry NSR - Personally Reviewed  ECG  N/a - Personally Reviewed  Physical Exam  GEN: No acute distress.   Neck: No JVD Cardiac: RRR, no murmurs, rubs, or gallops.  Respiratory: Clear to auscultation bilaterally. GI: Soft, nontender,  non-distended  MS: trace bilateral edema Neuro:  Nonfocal  Psych: Normal affect   Labs High Sensitivity Troponin:   Recent Labs  Lab 05/11/24 1601 05/11/24 1800 05/12/24 2021  TROPONINIHS 5 5 3      Chemistry Recent Labs  Lab 05/11/24 1601 05/11/24 1800 05/12/24 0416 05/13/24 0417 05/13/24 1847 05/14/24 0410  NA 142  --    < > 145 142 140  K 3.6  --    < > 4.0 3.9 4.3  CL 102  --    < > 102 98 98  CO2 29  --    < > 34* 31 31  GLUCOSE 122*  --    < > 160* 138* 272*  BUN 5*  --    < > 15 20 20   CREATININE 0.70  --    < > 0.90 0.87 0.86  CALCIUM  8.7*  --    < > 8.8* 8.4* 8.5*  MG  --  2.2  --   --   --  2.3  PROT 5.9*  --   --   --   --   --   ALBUMIN 2.8*  --   --   --   --   --  AST 22  --   --   --   --   --   ALT 19  --   --   --   --   --   ALKPHOS 64  --   --   --   --   --   BILITOT 0.5  --   --   --   --   --   GFRNONAA >60  --    < > >60 >60 >60  ANIONGAP 11  --    < > 9 13 11    < > = values in this interval not displayed.    Lipids No results for input(s): CHOL, TRIG, HDL, LABVLDL, LDLCALC, CHOLHDL in the last 168 hours.  Hematology Recent Labs  Lab 05/11/24 1601 05/12/24 0416 05/13/24 1847  WBC 9.2 8.4 11.9*  RBC 3.19* 3.38* 3.23*  HGB 8.6* 9.0* 8.7*  HCT 29.4* 30.6* 30.0*  MCV 92.2 90.5 92.9  MCH 27.0 26.6 26.9  MCHC 29.3* 29.4* 29.0*  RDW 17.2* 17.1* 17.5*  PLT 456* 459* 494*   Thyroid  No results for input(s): TSH, FREET4 in the last 168 hours.  BNP Recent Labs  Lab 05/11/24 1601  BNP 53.0    DDimer No results for input(s): DDIMER in the last 168 hours.   Radiology  DG CHEST PORT 1 VIEW Result Date: 05/12/2024 CLINICAL DATA:  Chest pain and shortness of breath. EXAM: PORTABLE CHEST 1 VIEW COMPARISON:  Radiograph yesterday FINDINGS: Unchanged cardiomediastinal contours. Increasing ill-defined opacity in the left hemithorax. Left pleural effusion may have increased. No visible pneumothorax. Stable osseous structures.  IMPRESSION: Increasing ill-defined opacity in the left hemithorax, may represent worsening pneumonia or asymmetric edema. Left pleural effusion may have increased. Electronically Signed   By: Andrea Gasman M.D.   On: 05/12/2024 20:06    Cardiac Studies   Patient Profile   Kathleen [name] is a 63 y.o. female with a hx of congestive heart failure, transient bradycardia and PAF in the setting of acute illness, sarcoidosis, chronic respiratory failure on 5 L, adrenal insufficiency, chronic pain. who is being seen 05/12/2024 for the evaluation of HF at the request of Dr. Briana.   Assessment & Plan   1.Acute on chronic HFpEF - CXR similar left small effusion, +pulmonary congestion. BNP 53 -from notes up 22 lbs from recent discharge, difficult to assess accuracy given bed weigths. There is a documented standing weight of 250 lbs, admission weight listed at 272  -03/2024 Echo reviewed. LVEF 60-65%, no WMAs. Mild flattening of ventricular septum suggesting some degree of RV pressure overload. Normal LA and RA sizes. Indeteriminate diastolic function, tissue e' velocity medial and lateral mildly decreased however fairly normal MV inflow patter. E/e' average is borderline elevated. Normal LA would argue against severe dysfunction. RV has normal size and function. TR jet insufficent to estimate PASP. RA pressure 8 mmHg.  - has had elevated PASP by echo in the past however this was in the setting of volume overload (07/2016 echo PASP in the 50s).    - I/Os incomplete, 5L of uop is documented. Weight 272-->264 lbs. She is on IV lasix  60mg  bid, renal function is stable.  - recent admit with UTI and urinary retention. Recurrent UTIs have been an issue, avoid SGLT2i  - chronic dyspnea, recurrent admission with acute on chronic HFpEF - her [facility] pulmonlogist (05/11/24 note in Epic) had requested a RHC during admission to evaluate for possible underlying pulmonary HTN given her sarcoid/ILD history.  -  plan for  transfer to [facility] with HF team evaluation and possible RHC once more euvolemic. Defer if may require additional workup for potential cardiac sarcoid to HF team.      2.Pulmonary sarcoid/ILD - chronically on 5L [place] at home - followed by [facility] pulmonary, extensive notes are in epic - I do not see any prior testing for cardiac sarcoid involvement   3. Chronic pain syndrome     4. OSA - not using cpap   5. Adrenal insufficiency/Chronic hypotension - she is on steroids, also midodrine  as outpatient.        For questions or updates, please contact [facility] Please consult [email or web address] for contact info under       Signed, Alvan Carrier, MD  05/14/2024, 10:09 AM

## 2024-05-14 NOTE — Care Management Important Message (Signed)
 Important Message  Patient Details  Name: Kathleen [name] MRN: [id number] Date of Birth: Jan 02, 1961   Important Message Given:  N/A - LOS <3 / Initial given by admissions     [name] L [name] 05/14/2024, 8:48 AM

## 2024-05-14 NOTE — Progress Notes (Signed)
 PROGRESS NOTE    Kathleen [name]  FMW:[id number] DOB: 1960-11-14 DOA: 05/11/2024 PCP: Alphonsa Glendia LABOR, MD   Brief Narrative:  63 year old female with history of chronic diastolic heart failure, paroxysmal A-fib, sarcoidosis, chronic respiratory failure on oxygen , adrenal insufficiency, chronic pain presented with worsening shortness of breath and swelling and was found to have acute on chronic heart failure.  She was started on IV diuretics.  Cardiology was consulted.  Assessment & Plan:   Acute on chronic diastolic heart failure - Echo from 04/24/2024 showed EF of 60 to 65% with grade 1 diastolic dysfunction. -Patient apparently gained more than 20 pound weight since her recent discharge - Cardiology following.  Diuretics as per cardiology.  Patient is adamant about needing a heart cath.  Cardiology planning to transfer to [facility] for cath once volume status has improved: Currently she cannot lie flat for cardiac cath. - Strict input output.  Daily weights.  Fluid restriction.  Negative balance of 12,035 cc since admission.  Chronic respiratory failure with hypoxia Interstitial lung disease/sarcoidosis - Respiratory status currently stable.  On 5 L oxygen  via nasal cannula as an outpatient.  Continue current nebs.  Depression/anxiety - Continue BuSpar .  Previously on Cymbalta : She stopped taking it.  Adrenal insufficiency -Chronic.  Continue Cortef  and midodrine .  Received IV Solu-Medrol  on admission  Hypothyroidism - Continue Synthroid   Chronic pain syndrome with opiate dependence -Will continue oxycodone  as needed.  Outpatient follow-up with PCP and/for pain management  OSA - Not on CPAP therapy  Obesity class II - Outpatient follow-up   DVT prophylaxis: Lovenox  Code Status: Full Family Communication: None at bedside Disposition Plan: Status is: Inpatient Remains inpatient appropriate because: Of severity of illness    Consultants: Cardiology  Procedures:  None  Antimicrobials: None   Subjective: Patient seen and examined at bedside.  Was more short of breath last evening but feels better this morning.  Denies any current chest pain.  No fever, worsening abdominal pain or vomiting reported.  Objective: Vitals:   05/13/24 1140 05/13/24 1340 05/13/24 1812 05/13/24 2116  BP: (!) 113/58 126/61    Pulse: 80 85    Resp:      Temp:      TempSrc:      SpO2: 96%  95% 97%  Weight:      Height:        Intake/Output Summary (Last 24 hours) at 05/14/2024 0740 Last data filed at 05/14/2024 0600 Gross per 24 hour  Intake --  Output 5000 ml  Net -5000 ml   Filed Weights   05/11/24 2043 05/12/24 0425 05/13/24 0440  Weight: 121.2 kg 119.6 kg 120 kg    Examination:  General: Currently on 4 L oxygen  via nasal cannula.  No distress ENT/neck: No thyromegaly.  JVD is not elevated  respiratory: Decreased breath sounds at bases bilaterally with some crackles; some wheezing  CVS: S1-S2 heard, rate controlled currently Abdominal: Soft, obese, nontender, slightly distended; no organomegaly, bowel sounds are heard Extremities: Trace lower extremity edema; no cyanosis  CNS: Awake and alert.  No focal neurologic deficit.  Moves extremities Lymph: No obvious lymphadenopathy Skin: No obvious ecchymosis/lesions  psych: Affect is mostly flat with no signs of agitation.   Musculoskeletal: No obvious joint swelling/deformity    Data Reviewed: I have personally reviewed following labs and imaging studies  CBC: Recent Labs  Lab 05/11/24 1601 05/12/24 0416 05/13/24 1847  WBC 9.2 8.4 11.9*  NEUTROABS 6.8  --  8.8*  HGB 8.6* 9.0*  8.7*  HCT 29.4* 30.6* 30.0*  MCV 92.2 90.5 92.9  PLT 456* 459* 494*   Basic Metabolic Panel: Recent Labs  Lab 05/11/24 1601 05/11/24 1800 05/12/24 0416 05/13/24 0417 05/13/24 1847 05/14/24 0410  NA 142  --  143 145 142 140  K 3.6  --  4.7 4.0 3.9 4.3  CL 102  --  101 102 98 98  CO2 29  --  31 34* 31 31   GLUCOSE 122*  --  190* 160* 138* 272*  BUN 5*  --  8 15 20 20   CREATININE 0.70  --  0.68 0.90 0.87 0.86  CALCIUM  8.7*  --  8.7* 8.8* 8.4* 8.5*  MG  --  2.2  --   --   --  2.3   GFR: Estimated Creatinine Clearance: 96.6 mL/min (by C-G formula based on SCr of 0.86 mg/dL). Liver Function Tests: Recent Labs  Lab 05/11/24 1601  AST 22  ALT 19  ALKPHOS 64  BILITOT 0.5  PROT 5.9*  ALBUMIN 2.8*   Recent Labs  Lab 05/11/24 1601  LIPASE 26   No results for input(s): AMMONIA  in the last 168 hours. Coagulation Profile: No results for input(s): INR, PROTIME in the last 168 hours. Cardiac Enzymes: No results for input(s): CKTOTAL, CKMB, CKMBINDEX, TROPONINI in the last 168 hours. BNP (last 3 results) No results for input(s): PROBNP in the last 8760 hours. HbA1C: No results for input(s): HGBA1C in the last 72 hours. CBG: Recent Labs  Lab 05/11/24 2258 05/12/24 0417  GLUCAP 174* 186*   Lipid Profile: No results for input(s): CHOL, HDL, LDLCALC, TRIG, CHOLHDL, LDLDIRECT in the last 72 hours. Thyroid  Function Tests: No results for input(s): TSH, T4TOTAL, FREET4, T3FREE, THYROIDAB in the last 72 hours. Anemia Panel: No results for input(s): VITAMINB12, FOLATE, FERRITIN, TIBC, IRON , RETICCTPCT in the last 72 hours. Sepsis Labs: No results for input(s): PROCALCITON, LATICACIDVEN in the last 168 hours.  Recent Results (from the past 240 hours)  Urine Culture     Status: Abnormal   Collection Time: 05/05/24  2:42 PM   Specimen: Urine, Clean Catch   UR  Result Value Ref Range Status   Urine Culture, Routine Final report (A)  Final   Organism ID, Bacteria Enterococcus faecalis (A)  Final    Comment: Enterococci susceptible to penicillin  are predictably susceptible to ampicillin, amoxicillin , ampicillin-sulbactam, amoxicillin -clavulanate, and piperacillin -tazobactam for non-beta-lactamase producing enterococci. (CLSI  2018) For Enterococcus species, aminoglycosides (except for high-level resistance screening), cephalosporins, clindamycin , and trimethoprim -sulfamethoxazole  are not effective clinically. (CLSI, M100-S26, 2016) Greater than 100,000 colony forming units per mL    Antimicrobial Susceptibility Comment  Final    Comment:       ** S = Susceptible; I = Intermediate; R = Resistant **                    P = Positive; N = Negative             MICS are expressed in micrograms per mL    Antibiotic                 RSLT#1    RSLT#2    RSLT#3    RSLT#4 Ciprofloxacin                   S Levofloxacin                    S Nitrofurantoin  S Penicillin                      S Tetracycline                   S Vancomycin                      S   Microscopic Examination     Status: Abnormal   Collection Time: 05/05/24  2:56 PM   Urine  Result Value Ref Range Status   WBC, UA 6-10 (A) 0 - 5 /hpf Final   RBC, Urine 0-2 0 - 2 /hpf Final   Epithelial Cells (non renal) 0-10 0 - 10 /hpf Final   Bacteria, UA None seen None seen/Few Final  Resp panel by RT-PCR (RSV, Flu A&B, Covid) Anterior Nasal Swab     Status: None   Collection Time: 05/11/24  4:05 PM   Specimen: Anterior Nasal Swab  Result Value Ref Range Status   SARS Coronavirus 2 by RT PCR NEGATIVE NEGATIVE Final    Comment: (NOTE) SARS-CoV-2 target nucleic acids are NOT DETECTED.  The SARS-CoV-2 RNA is generally detectable in upper respiratory specimens during the acute phase of infection. The lowest concentration of SARS-CoV-2 viral copies this assay can detect is 138 copies/mL. A negative result does not preclude SARS-Cov-2 infection and should not be used as the sole basis for treatment or other patient management decisions. A negative result may occur with  improper specimen collection/handling, submission of specimen other than nasopharyngeal swab, presence of viral mutation(s) within the areas targeted by this assay, and  inadequate number of viral copies(<138 copies/mL). A negative result must be combined with clinical observations, patient history, and epidemiological information. The expected result is Negative.  Fact Sheet for Patients:  BloggerCourse.com  Fact Sheet for Healthcare Providers:  SeriousBroker.it  This test is no t yet approved or cleared by the [place]  FDA and  has been authorized for detection and/or diagnosis of SARS-CoV-2 by FDA under an Emergency Use Authorization (EUA). This EUA will remain  in effect (meaning this test can be used) for the duration of the COVID-19 declaration under Section 564(b)(1) of the Act, 21 U.S.C.section 360bbb-3(b)(1), unless the authorization is terminated  or revoked sooner.       Influenza A by PCR NEGATIVE NEGATIVE Final   Influenza B by PCR NEGATIVE NEGATIVE Final    Comment: (NOTE) The Xpert Xpress SARS-CoV-2/FLU/RSV plus assay is intended as an aid in the diagnosis of influenza from Nasopharyngeal swab specimens and should not be used as a sole basis for treatment. Nasal washings and aspirates are unacceptable for Xpert Xpress SARS-CoV-2/FLU/RSV testing.  Fact Sheet for Patients: BloggerCourse.com  Fact Sheet for Healthcare Providers: SeriousBroker.it  This test is not yet approved or cleared by the [place]  FDA and has been authorized for detection and/or diagnosis of SARS-CoV-2 by FDA under an Emergency Use Authorization (EUA). This EUA will remain in effect (meaning this test can be used) for the duration of the COVID-19 declaration under Section 564(b)(1) of the Act, 21 U.S.C. section 360bbb-3(b)(1), unless the authorization is terminated or revoked.     Resp Syncytial Virus by PCR NEGATIVE NEGATIVE Final    Comment: (NOTE) Fact Sheet for Patients: BloggerCourse.com  Fact Sheet for Healthcare  Providers: SeriousBroker.it  This test is not yet approved or cleared by the [place]  FDA and has been authorized for detection and/or diagnosis of SARS-CoV-2 by FDA under an  Emergency Use Authorization (EUA). This EUA will remain in effect (meaning this test can be used) for the duration of the COVID-19 declaration under Section 564(b)(1) of the Act, 21 U.S.C. section 360bbb-3(b)(1), unless the authorization is terminated or revoked.  Performed at [facility], [address] [place], [place] [address]          Radiology Studies: DG CHEST PORT 1 VIEW Result Date: 05/12/2024 CLINICAL DATA:  Chest pain and shortness of breath. EXAM: PORTABLE CHEST 1 VIEW COMPARISON:  Radiograph yesterday FINDINGS: Unchanged cardiomediastinal contours. Increasing ill-defined opacity in the left hemithorax. Left pleural effusion may have increased. No visible pneumothorax. Stable osseous structures. IMPRESSION: Increasing ill-defined opacity in the left hemithorax, may represent worsening pneumonia or asymmetric edema. Left pleural effusion may have increased. Electronically Signed   By: Andrea Gasman M.D.   On: 05/12/2024 20:06        Scheduled Meds:  budesonide  (PULMICORT ) nebulizer solution  0.5 mg Nebulization BID   busPIRone   15 mg Oral TID   Chlorhexidine  Gluconate Cloth  6 each Topical Q0600   enoxaparin  (LOVENOX ) injection  60 mg Subcutaneous Q24H   furosemide   60 mg Intravenous Q12H   hydrocortisone   20 mg Oral Daily   And   hydrocortisone   10 mg Oral Daily   levothyroxine   50 mcg Oral QAC breakfast   linaclotide   145 mcg Oral QAC breakfast   LORazepam   1 mg Oral Once   midodrine   5 mg Oral BID WC   pantoprazole   40 mg Oral BID   polyethylene glycol  17 g Oral BID   revefenacin   175 mcg Nebulization Daily   rosuvastatin   20 mg Oral Daily   senna-docusate  2 tablet Oral QHS   topiramate   50 mg Oral Q lunch   Continuous  Infusions:        Sophie Mao, MD [facility] 05/14/2024, 7:40 AM

## 2024-05-14 NOTE — Plan of Care (Signed)
  Problem: Education: Goal: Knowledge of General Education information will improve Description: Including pain rating scale, medication(s)/side effects and non-pharmacologic comfort measures Outcome: Progressing   Problem: Health Behavior/Discharge Planning: Goal: Ability to manage health-related needs will improve Outcome: Not Progressing   Problem: Clinical Measurements: Goal: Ability to maintain clinical measurements within normal limits will improve Outcome: Progressing Goal: Will remain free from infection Outcome: Progressing Goal: Diagnostic test results will improve Outcome: Progressing Goal: Respiratory complications will improve Outcome: Progressing Goal: Cardiovascular complication will be avoided Outcome: Progressing   Problem: Activity: Goal: Risk for activity intolerance will decrease Outcome: Not Progressing   Problem: Nutrition: Goal: Adequate nutrition will be maintained Outcome: Progressing   Problem: Coping: Goal: Level of anxiety will decrease Outcome: Not Progressing   Problem: Elimination: Goal: Will not experience complications related to bowel motility Outcome: Progressing Goal: Will not experience complications related to urinary retention Outcome: Progressing   Problem: Pain Managment: Goal: General experience of comfort will improve and/or be controlled Outcome: Progressing   Problem: Safety: Goal: Ability to remain free from injury will improve Outcome: Progressing   Problem: Skin Integrity: Goal: Risk for impaired skin integrity will decrease Outcome: Progressing   Problem: Education: Goal: Ability to demonstrate management of disease process will improve Outcome: Not Progressing Goal: Ability to verbalize understanding of medication therapies will improve Outcome: Progressing Goal: Individualized Educational Video(s) Outcome: Progressing   Problem: Activity: Goal: Capacity to carry out activities will improve Outcome: Not  Progressing   Problem: Cardiac: Goal: Ability to achieve and maintain adequate cardiopulmonary perfusion will improve Outcome: Progressing

## 2024-05-15 ENCOUNTER — Encounter (HOSPITAL_COMMUNITY)
Admission: EM | Disposition: A | Discharge status: Home Health | Payer: Self-pay | Source: Home / Self Care | Attending: Internal Medicine

## 2024-05-15 DIAGNOSIS — Z515 Encounter for palliative care: Secondary | ICD-10-CM | POA: Diagnosis not present

## 2024-05-15 DIAGNOSIS — Z7189 Other specified counseling: Secondary | ICD-10-CM

## 2024-05-15 DIAGNOSIS — I5033 Acute on chronic diastolic (congestive) heart failure: Secondary | ICD-10-CM | POA: Diagnosis not present

## 2024-05-15 DIAGNOSIS — Z8709 Personal history of other diseases of the respiratory system: Secondary | ICD-10-CM

## 2024-05-15 DIAGNOSIS — J9611 Chronic respiratory failure with hypoxia: Secondary | ICD-10-CM | POA: Diagnosis not present

## 2024-05-15 HISTORY — PX: RIGHT HEART CATH: CATH118263

## 2024-05-15 LAB — CBC
HCT: 32.7 % — ABNORMAL LOW (ref 36.0–46.0)
Hemoglobin: 9.5 g/dL — ABNORMAL LOW (ref 12.0–15.0)
MCH: 26.3 pg (ref 26.0–34.0)
MCHC: 29.1 g/dL — ABNORMAL LOW (ref 30.0–36.0)
MCV: 90.6 fL (ref 80.0–100.0)
Platelets: 485 K/uL — ABNORMAL HIGH (ref 150–400)
RBC: 3.61 MIL/uL — ABNORMAL LOW (ref 3.87–5.11)
RDW: 17.1 % — ABNORMAL HIGH (ref 11.5–15.5)
WBC: 11.4 K/uL — ABNORMAL HIGH (ref 4.0–10.5)
nRBC: 0 % (ref 0.0–0.2)

## 2024-05-15 LAB — BASIC METABOLIC PANEL WITH GFR
Anion gap: 15 (ref 5–15)
Anion gap: 12 (ref 5–15)
BUN: 18 mg/dL (ref 8–23)
BUN: 19 mg/dL (ref 8–23)
CO2: 33 mmol/L — ABNORMAL HIGH (ref 22–32)
CO2: 32 mmol/L (ref 22–32)
Calcium: 9 mg/dL (ref 8.9–10.3)
Calcium: 9 mg/dL (ref 8.9–10.3)
Chloride: 96 mmol/L — ABNORMAL LOW (ref 98–111)
Chloride: 99 mmol/L (ref 98–111)
Creatinine, Ser: 0.69 mg/dL (ref 0.44–1.00)
Creatinine, Ser: 0.81 mg/dL (ref 0.44–1.00)
GFR, Estimated: >60 mL/min (ref >60)
GFR, Estimated: >60 mL/min (ref >60)
Glucose, Bld: 116 mg/dL — ABNORMAL HIGH (ref 70–99)
Glucose, Bld: 109 mg/dL — ABNORMAL HIGH (ref 70–99)
Potassium: 3.8 mmol/L (ref 3.5–5.1)
Potassium: 3.8 mmol/L (ref 3.5–5.1)
Sodium: 144 mmol/L (ref 135–145)
Sodium: 143 mmol/L (ref 135–145)

## 2024-05-15 LAB — BLOOD GAS, ARTERIAL
Acid-Base Excess: 15.4 mmol/L — ABNORMAL HIGH (ref 0.0–2.0)
Bicarbonate: 41.7 mmol/L — ABNORMAL HIGH (ref 20.0–28.0)
O2 Saturation: 96.7 %
Patient temperature: 36.6
pCO2 arterial: 55 mmHg — ABNORMAL HIGH (ref 32–48)
pH, Arterial: 7.49 — ABNORMAL HIGH (ref 7.35–7.45)
pO2, Arterial: 80 mmHg — ABNORMAL LOW (ref 83–108)

## 2024-05-15 LAB — MAGNESIUM
Magnesium: 2.4 mg/dL (ref 1.7–2.4)
Magnesium: 2.3 mg/dL (ref 1.7–2.4)

## 2024-05-15 SURGERY — RIGHT HEART CATH
Anesthesia: LOCAL

## 2024-05-15 MED ORDER — FUROSEMIDE 10 MG/ML IJ SOLN
80.0000 mg | Freq: Two times a day (BID) | INTRAMUSCULAR | Status: DC
  Administered 2024-05-15 – 2024-05-16 (×3): 80 mg via INTRAVENOUS
  Filled 2024-05-15 (×2): qty 8

## 2024-05-15 SURGICAL SUPPLY — 1 items: PACK CARDIAC CATHETERIZATION (CUSTOM PROCEDURE TRAY) ×1 IMPLANT

## 2024-05-15 NOTE — Progress Notes (Signed)
 OT Cancellation Note  Patient Details Name: Kathleen [name] MRN: [id number] DOB: [birth year]   Cancelled Treatment:    Reason Eval/Treat Not Completed: Patient at procedure or test/ unavailable. Pt off unit for scheduled procedure. Will follow up as able to.   Jaylyn Iyer C, OT  Acute Rehabilitation Services Office [phone] Secure chat preferred   Adrianne GORMAN Savers 05/15/2024, 10:23 AM

## 2024-05-15 NOTE — Progress Notes (Addendum)
 PROGRESS NOTE SHADAE [name]  FMW:[id number] DOB: 07/06/1961 DOA: 05/11/2024 PCP: Alphonsa Glendia LABOR, MD  Brief Narrative/Hospital Course: Kathleen [name] is a 63 y.o. 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  She has chronic hypoxic tori failure on supplemental oxygen .. 9/19> transferred from [facility] to [facility] for advanced heart failure team evaluation  Subjective: Seen and examined today Overnight afebrile BP stable on 4-5 L [place]  reviewed Husband at the bedside patient appears very ill and sick short of breath, bedbound On 5l [place] -21.9 L net output ??-Charted 10.8 L output yesterday Waiting for cardiac cath today  Assessment and plan:  Acute on chronic HFpEF Left small effusion/pulmonary congestion Chronic dyspnea- Recent admission for acute on chronic HFpEF: Patient admitted with 22 LB weight gain, recent echo August EF 60 to 65% indeterminate diastolic function, elevated PASP by echo in the past. Cardiology following transferred to [facility] for AHF team eval-for cardiac cath 9/19-recent pulmonary eval at [facility] requesting RHC to eval for underlying pulmonary hypertension given her sarcoid/ILD history. Continue diuresis Lasix  60 every 12, and GDMT- per cardiology. Cont to monitor daily I/O,weight, electrolytes and net balance as below.Keep on  salt/fluid restricted diet and monitor in tele. Net IO Since Admission: -21,965  mL [05/15/24 0900]  Filed Weights   05/13/24 0440 05/14/24 2054 05/15/24 0456  Weight: 120 kg 118.9 kg 116 kg    Recent Labs  Lab 05/11/24 1601 05/11/24 1800 05/12/24 0416 05/13/24 0417 05/13/24 1847 05/14/24 0410 05/15/24 0244 05/15/24 0753  BNP 53.0  --   --   --   --   --   --   --   BUN 5*  --    < > 15 20 20 18 19   CREATININE 0.70  --    < > 0.90 0.87 0.86 0.69 0.81  K 3.6  --    < > 4.0 3.9 4.3 3.8 3.8  MG  --  2.2  --   --   --  2.3 2.4 2.3   < > = values in this interval not displayed.     Pulmonary sarcoidosis ILD Chronic hypoxic respiratory failure on 5 L [place]: Continue supplemental oxygen .  She is followed by [facility], RHC as above.  Continue Pulmicort  nebs  Lethargy Weak: Hold off on iv pain meds. Check ABG.  Goals of care Bed bound w/ dyspnea Ambulatory dysfunction Frequent hospitalization-she had 9 hospitalizations since January this year and : Overall poor prognosis. Consult palliative care, she is at high risk for decompensation, risk for frequent admissions. Currently she remains full code but I am worried about her, overall prognosis does not appear bright-informed patient's husband, I have also put in palliative care consultation.  OSA: Not using CPAP.  Chronic pain syndrome Continue home oxy, multimodal pain management  Adrenal insufficiency Hypothyroidism Chronic hypotension: Continue her home Synthroid , midodrine  and Cortef .  Received IV Solu-Medrol  this admission  Anxiety/depression Mood stable continue her BuSpar , Topamax   HLD continue statin  Constipation: continue Linzess ,  MiraLAX   Class I Obesity w/ Body mass index is 36.69 kg/m.: Will benefit with PCP follow-up, weight loss,healthy lifestyle  Mobility: PT Orders: Active PT Follow up Rec: Acute Inpatient Rehab (3hours/Day)05/12/2024 1232   DVT prophylaxis:  Code Status:   Code Status: Full Code Family Communication: plan of care discussed with patient/husband at bedside. Patient  status is: Remains hospitalized because of severity of illness Level of care: Telemetry Cardiac   Dispo: The patient is from: home            Anticipated disposition: TBD Objective: Vitals last 24 hrs: Vitals:   05/14/24 2054 05/15/24 0000 05/15/24 0456 05/15/24 0757  BP: (!) 121/51 (!) 148/64 131/63 128/66  Pulse: 81 (!) 105 74 79  Resp: 20 20 20  (!) 23  Temp: 98 F (36.7 C) 98 F (36.7 C) 98.2 F (36.8 C) 98 F (36.7 C)  TempSrc: Oral Oral Oral Oral  SpO2: 94%   96%  Weight: 118.9 kg  116 kg   Height: 5' 10 (1.778 m)       Physical Examination: General exam: alert awake, oriented, older than stated age HEENT:Oral mucosa moist, Ear/Nose WNL grossly Respiratory system: Bilaterally diminished BS, wheezing, use of accessory muscle Cardiovascular system: S1 & S2 +, No JVD. Gastrointestinal system: Abdomen soft,NT,ND, BS+ Nervous System: lethargic wakes up and able to talk Extremities: extremities warm, leg edema mild Skin: No rashes,no icterus. MSK: Normal muscle bulk,tone, power   Medications reviewed:  Scheduled Meds:  budesonide  (PULMICORT ) nebulizer solution  0.5 mg Nebulization BID   busPIRone   15 mg Oral TID   Chlorhexidine  Gluconate Cloth  6 each Topical Q0600   enoxaparin  (LOVENOX ) injection  60 mg Subcutaneous Q24H   hydrocortisone   20 mg Oral Daily   And   hydrocortisone   10 mg Oral Daily   levothyroxine   50 mcg Oral QAC breakfast   linaclotide   145 mcg Oral QAC breakfast   LORazepam   1 mg Oral Once   midodrine   5 mg Oral BID WC   pantoprazole   40 mg Oral BID   polyethylene glycol  17 g Oral BID   revefenacin   175 mcg Nebulization Daily   rosuvastatin   20 mg Oral Daily   senna-docusate  2 tablet Oral QHS   topiramate   50 mg Oral Q lunch   Continuous Infusions: Diet: Diet Order             Diet Heart Fluid consistency: Thin  Diet effective now                   Data Reviewed: I have personally reviewed following labs and imaging studies ( see  epic result tab) CBC: Recent Labs  Lab 05/11/24 1601 05/12/24 0416 05/13/24 1847 05/15/24 0753  WBC 9.2 8.4 11.9* 11.4*  NEUTROABS 6.8  --  8.8*  --   HGB 8.6* 9.0* 8.7* 9.5*  HCT 29.4* 30.6* 30.0* 32.7*  MCV 92.2 90.5 92.9 90.6  PLT 456* 459* 494* 485*   CMP: Recent Labs  Lab 05/11/24 1800 05/12/24 0416 05/13/24 0417 05/13/24 1847 05/14/24 0410 05/15/24 0244 05/15/24 0753  NA  --    < > 145 142 140 144 143  K  --    < > 4.0 3.9 4.3 3.8 3.8  CL  --    < > 102 98 98 96* 99  CO2  --    < > 34* 31 31 33* 32  GLUCOSE  --    < > 160* 138*  272* 116* 109*  BUN  --    < > 15 20 20 18 19   CREATININE  --    < > 0.90 0.87 0.86 0.69 0.81  CALCIUM   --    < > 8.8* 8.4* 8.5* 9.0 9.0  MG 2.2  --   --   --  2.3 2.4 2.3   < > = values in this interval not displayed.   GFR: Estimated Creatinine Clearance: 100.7 mL/min (by C-G formula based on SCr of 0.81 mg/dL). Recent Labs  Lab 05/11/24 1601  AST 22  ALT 19  ALKPHOS 64  BILITOT 0.5  PROT 5.9*  ALBUMIN 2.8*    Recent Labs  Lab 05/11/24 1601  LIPASE 26   No results for input(s): AMMONIA  in the last 168 hours. Coagulation Profile: No results for input(s): INR, PROTIME in the last 168 hours. Unresulted Labs (From admission, onward)     Start     Ordered   05/16/24 0500  CBC  Daily,   R     Question:  Specimen collection method  Answer:  Lab=Lab collect   05/15/24 0719   05/16/24 0500  Basic metabolic panel with GFR  Daily,   R     Question:  Specimen collection method  Answer:  Lab=Lab collect   05/15/24 0719   05/15/24 1200  Blood gas, arterial  ONCE - STAT,   STAT        05/15/24 1159           Antimicrobials/Microbiology: Anti-infectives (From admission, onward)    None         Component Value Date/Time   SDES  04/23/2024 1229    URINE, CLEAN CATCH Performed at [facility], [address], [place], [place] [address]    [facility]  04/23/2024 1229    NONE Performed at [facility], [address], [place], [place] [address]    CULT 80,000 COLONIES/mL PROVIDENCIA STUARTII (A) 04/23/2024 1229   REPTSTATUS 04/25/2024 FINAL 04/23/2024 1229    Procedures: Procedure(s) (LRB): RIGHT HEART CATH (N/A)   Mennie LAMY, MD [facility] 05/15/2024, 1:18 PM

## 2024-05-15 NOTE — Evaluation (Signed)
 Physical Therapy Progress Note Patient Details Name: Kathleen [name] MRN: [id number] DOB: 1961/07/15 Today's Date: 05/15/2024  History of Present Illness  Kathleen [name] is a 63 y.o. female with medical history significant for congestive heart failure, sarcoidosis, chronic respiratory failure on 5 L, adrenal insufficiency, chronic pain.  Patient presented to the ED with complaints of bilateral lower extremity swelling, abdominal distention, difficulty breathing, and weight gain that started a couple of days after discharge from the hospital.  She reports compliance with her medications-Lasix  20 mg daily.  She reports reduced response to Lasix .  She reports mid central sharp chest pain that sometimes radiates down her left arm, chest pain is intermittent with no aggravating factors and sometimes occurs at rest.  Clinical Impression  Pt admitted with/for s/s of CHF including fluid overload in abdomen and LE's.  Pt's mobility in limited, but when mobilizing, pt needs min to moderate assist for basic mobility and standing.  Have not witnessed gait. Pt currently limited functionally due to the problems listed below.  (see problems list.)  Pt will benefit from PT to maximize and safety to be able to get home safely with available assist .  Recommend continuance of HHPT after discharge.         If plan is discharge home, recommend the following: A little help with walking and/or transfers;A little help with bathing/dressing/bathroom   Can travel by private vehicle   No    Equipment Recommendations None recommended by PT  Recommendations for Other Services       Functional Status Assessment       Precautions / Restrictions Precautions Precautions: Fall Recall of Precautions/Restrictions: Intact      Mobility  Bed Mobility Overal bed mobility: Needs Assistance Bed Mobility: Sidelying to Sit   Sidelying to sit: Min assist, +2 for safety/equipment, +2 for physical assistance   Sit to  supine: Supervision, HOB elevated   General bed mobility comments: labored coming up from R elbow from long time in sidelying.  Once moving, pt able to continue transition/repostioning with less help    Transfers   Equipment used: Ambulation equipment used Transfers: Sit to/from Stand Sit to Stand: Min assist, +2 safety/equipment   Step pivot transfers: Contact guard assist            Ambulation/Gait               General Gait Details: pt wanted to return to sidelying, but took 1 step up toward [facility] with CGA and support of Designer, industrial/product     Tilt Bed    Modified Rankin (Stroke Patients Only)       Balance Overall balance assessment: Needs assistance Sitting-balance support: Feet unsupported, Bilateral upper extremity supported Sitting balance-Leahy Scale: Fair Sitting balance - Comments: fair balance in sitting, but generally uses bil UE for support   Standing balance support: Single extremity supported, During functional activity Standing balance-Leahy Scale: Fair Standing balance comment: generally preferring to have external support                             Pertinent Vitals/Pain Pain Assessment Pain Assessment: Faces Faces Pain Scale: Hurts even more Pain Location: back  in sitting/standing Pain Descriptors / Indicators: Discomfort, Grimacing Pain Intervention(s): Limited activity within patient's tolerance, Repositioned    Home Living  Prior Function                       Extremity/Trunk Assessment                Communication   Communication Communication: No apparent difficulties    Cognition Arousal: Lethargic Behavior During Therapy: WFL for tasks assessed/performed, Flat affect, Anxious   PT - Cognitive impairments: No apparent impairments                         Following commands: Intact       Cueing Cueing  Techniques: Verbal cues     General Comments General comments (skin integrity, edema, etc.): vss    Exercises     Assessment/Plan    PT Assessment    PT Problem List         PT Treatment Interventions      PT Goals (Current goals can be found in the Care Plan section)  Acute Rehab PT Goals PT Goal Formulation: With patient Time For Goal Achievement: 05/29/24    Frequency Min 3X/week     Co-evaluation PT/OT/SLP Co-Evaluation/Treatment: Yes Reason for Co-Treatment: To address functional/ADL transfers PT goals addressed during session: Mobility/safety with mobility;Balance OT goals addressed during session: ADL's and self-care       AM-PAC PT 6 Clicks Mobility  Outcome Measure Help needed turning from your back to your side while in a flat bed without using bedrails?: A Little Help needed moving from lying on your back to sitting on the side of a flat bed without using bedrails?: A Lot Help needed moving to and from a bed to a chair (including a wheelchair)?: A Lot Help needed standing up from a chair using your arms (e.g., wheelchair or bedside chair)?: A Lot Help needed to walk in hospital room?: A Lot Help needed climbing 3-5 steps with a railing? : Total 6 Click Score: 12    End of Session Equipment Utilized During Treatment: Oxygen  Activity Tolerance: Patient limited by pain Patient left: in bed;with call bell/phone within reach;with family/visitor present (in sidelying) Nurse Communication: Mobility status PT Visit Diagnosis: Other abnormalities of gait and mobility (R26.89);Pain;Unsteadiness on feet (R26.81)    Time: 8553-8483 PT Time Calculation (min) (ACUTE ONLY): 30 min   Charges:     PT Treatments $Therapeutic Activity: 8-22 mins PT General Charges $$ ACUTE PT VISIT: 1 Visit         05/15/2024  India HERO., PT Acute Rehabilitation Services [phone]  (office)  Vinie GAILS Kimblery Diop 05/15/2024, 3:42 PM

## 2024-05-15 NOTE — Progress Notes (Signed)
 Occupational Therapy Treatment Patient Details Name: Kathleen [name] MRN: [id number] DOB: [birth year] Today's Date: 05/15/2024   History of present illness Kathleen [name] is a 63 y.o. female with medical history significant for congestive heart failure, sarcoidosis, chronic respiratory failure on 5 L, adrenal insufficiency, chronic pain.  Patient presented to the ED with complaints of bilateral lower extremity swelling, abdominal distention, difficulty breathing, and weight gain that started a couple of days after discharge from the hospital.  She reports compliance with her medications-Lasix  20 mg daily.  She reports reduced response to Lasix .  She reports mid central sharp chest pain that sometimes radiates down her left arm, chest pain is intermittent with no aggravating factors and sometimes occurs at rest.   OT comments   Pt  with limited progress in today's session. Only tolerating brief stand and lateral side steps at min assist. Pt continues to be limited by pain and poor activity tolerance. Spoke to spouse pt is likely close to functional baseline but expresses wishes for pt to continue working with therapy to improve mobility and pain management. Updated d/c recs to  HHOT. Will continue to follow acutely.       If plan is discharge home, recommend the following:  A lot of help with walking and/or transfers;A lot of help with bathing/dressing/bathroom;Assistance with cooking/housework;Assist for transportation;Help with stairs or ramp for entrance   Equipment Recommendations  None recommended by OT       Precautions / Restrictions Precautions Precautions: Fall Recall of Precautions/Restrictions: Intact Precaution/Restrictions Comments: Back for comfort Restrictions Weight Bearing Restrictions Per Provider Order: No       Mobility Bed Mobility Overal bed mobility: Needs Assistance Bed Mobility: Sidelying to Sit   Sidelying to sit: Min assist, +2 for safety/equipment, +2 for  physical assistance   Sit to supine: Supervision, HOB elevated   General bed mobility comments: Slow to complete, assist to initate d/t lethargy    Transfers Overall transfer level: Needs assistance Equipment used: Ambulation equipment used Transfers: Sit to/from Stand Sit to Stand: Min assist, +2 safety/equipment     Step pivot transfers: Contact guard assist     General transfer comment: Pt able to stand with min assist and chair to support. Pt only tolerating 2-3 lateral steps along EOB before fatigue     Balance Overall balance assessment: Needs assistance Sitting-balance support: Feet unsupported, Bilateral upper extremity supported Sitting balance-Leahy Scale: Fair Sitting balance - Comments: fair balance in sitting, but generally uses bil UE for support   Standing balance support: Single extremity supported, During functional activity Standing balance-Leahy Scale: Fair Standing balance comment: generally preferring to have external support       ADL either performed or assessed with clinical judgement   ADL Overall ADL's : Needs assistance/impaired     Lower Body Dressing: Total assistance;Sit to/from stand Lower Body Dressing Details (indicate cue type and reason): Total assist for socks, needing BUE support to stand Toilet Transfer: Minimal assistance Toilet Transfer Details (indicate cue type and reason): Short lateral steps, use of chair to support Toileting- Clothing Manipulation and Hygiene: Total assistance;Sit to/from stand       Functional mobility during ADLs: Minimal assistance General ADL Comments: Pt with poor tolerance, pt reports total assist for perihygiene d/t poor standing tolerance    Extremity/Trunk Assessment Upper Extremity Assessment Upper Extremity Assessment: Generalized weakness   Lower Extremity Assessment Lower Extremity Assessment: Defer to PT evaluation        Vision   Vision Assessment?:  No apparent visual deficits          Communication Communication Communication: No apparent difficulties   Cognition Arousal: Lethargic Behavior During Therapy: WFL for tasks assessed/performed, Flat affect, Anxious       Following commands: Intact        Cueing   Cueing Techniques: Verbal cues        General Comments VSS on 5L    Pertinent Vitals/ Pain       Pain Assessment Faces Pain Scale: Hurts even more Pain Location: back  in sitting/standing Pain Descriptors / Indicators: Discomfort, Grimacing   Frequency  Min 1X/week        Progress Toward Goals  OT Goals(current goals can now be found in the care plan section)  Progress towards OT goals: OT to reassess next treatment  Acute Rehab OT Goals Patient Stated Goal: Lay down OT Goal Formulation: With patient Time For Goal Achievement: 05/26/24 Potential to Achieve Goals: Fair ADL Goals Pt Will Perform Grooming: with modified independence;standing Pt Will Perform Upper Body Bathing: with modified independence;with set-up Pt Will Perform Upper Body Dressing: with modified independence;with set-up Pt Will Perform Lower Body Dressing: with min assist;sitting/lateral leans Pt Will Transfer to Toilet: with supervision;ambulating Pt Will Perform Toileting - Clothing Manipulation and hygiene: with supervision;sit to/from stand;sitting/lateral leans Pt/caregiver will Perform Home Exercise Program: Increased strength;Both right and left upper extremity;Independently  Plan      Co-evaluation    PT/OT/SLP Co-Evaluation/Treatment: Yes Reason for Co-Treatment: To address functional/ADL transfers PT goals addressed during session: Mobility/safety with mobility;Balance OT goals addressed during session: ADL's and self-care      AM-PAC OT 6 Clicks Daily Activity     Outcome Measure   Help from another person eating meals?: None Help from another person taking care of personal grooming?: A Little Help from another person toileting, which  includes using toliet, bedpan, or urinal?: Total Help from another person bathing (including washing, rinsing, drying)?: A Lot Help from another person to put on and taking off regular upper body clothing?: A Little Help from another person to put on and taking off regular lower body clothing?: Total 6 Click Score: 14    End of Session    OT Visit Diagnosis: Unsteadiness on feet (R26.81);Other abnormalities of gait and mobility (R26.89);Muscle weakness (generalized) (M62.81)   Activity Tolerance Patient limited by pain   Patient Left in bed;with call bell/phone within reach   Nurse Communication Mobility status        Time: 8553-8486 OT Time Calculation (min): 27 min  Charges: OT General Charges $OT Visit: 1 Visit OT Treatments $Self Care/Home Management : 8-22 mins  Adrianne BROCKS, OT  Acute Rehabilitation Services Office [phone] Secure chat preferred   Adrianne GORMAN Savers 05/15/2024, 4:52 PM

## 2024-05-15 NOTE — Progress Notes (Deleted)
 PROGRESS NOTE Kathleen [name]  FMW:[id number] DOB: 09-01-1960 DOA: 05/11/2024 PCP: Alphonsa Glendia LABOR, MD  Brief Narrative/Hospital Course: Kathleen [name] is a 62 y.o. female with PMH of heart failure, paroxysmal atrial fibrillation, sarcoidosis, chronic respiratory failure on oxygen , adrenal insufficiency, chronic pain.  Patient presented secondary to shortness of breath and swelling, found to have evidence of acute on chronic heart failure, cardiology was consulted, patient was admitted and started on IV diuretics 9/19> transferred from  [facility] to [facility] for advanced heart failure team evaluation  Subjective: Seen and examined today Overnight afebrile BP stable on 4-5 L [place] [facility]  reviewed On 5l [place], appears shrot of breaht husband at bedside -21.9 L net output ??-Charted 10.8 L output yesterday  Assessment and plan:  Acute on chronic HFpEF Left small effusion/pulmonary congestion Chronic dyspnea- Recent admission for acute on chronic HFpEF: Patient admitted with 22 LB weight gain, recent echo August EF 60 to 65% indeterminate diastolic function, elevated PASP by echo in the past. Cardiology following transferred to [facility] for AHF team eval-for cardiac cath 9/19-recent pulmonary eval at [facility] requesting RHC to eval for underlying pulmonary hypertension given her sarcoid/ILD history. Continue diuresis Lasix  60 every 12, and GDMT- per cardiology. Cont to monitor daily I/O,weight, electrolytes and net balance as below.Keep on  salt/fluid restricted diet and monitor in tele. Net IO Since Admission: -21,965 mL [05/15/24 0900]  Filed Weights   05/13/24 0440 05/14/24 2054 05/15/24 0456  Weight: 120 kg 118.9 kg 116 kg    Recent Labs  Lab 05/11/24 1601 05/11/24 1800 05/12/24 0416 05/13/24 0417 05/13/24 1847 05/14/24 0410 05/15/24 0244 05/15/24 0753  BNP 53.0  --   --   --   --   --   --   --   BUN 5*  --    < > 15 20 20 18 19   CREATININE 0.70  --    < > 0.90 0.87 0.86 0.69 0.81  K 3.6   --    < > 4.0 3.9 4.3 3.8 3.8  MG  --  2.2  --   --   --  2.3 2.4 2.3   < > = values in this interval not displayed.     Pulmonary sarcoidosis ILD Chronic hypoxic spectra  failure on 5 L [place]: Continue supplemental oxygen .  She is followed by [facility], RHC as above.  Continue Pulmicort , a pleurae  Bed bound w/ dyspnea Ambulatory dysfunction: Overall poor prognosis. Consult palliative  OSA: Not using CPAP.  Chronic pain syndrome Continue home oxy, multimodal pain management  Adrenal insufficiency Hypothyroidism Chronic hypotension: Continue her home Synthroid , midodrine  and Cortef .  Received IV Solu-Medrol  this admission  Anxiety/depression Mood stable continue her BuSpar , Topamax   HLD continue statin  Constipation: continue Linzess , MiraLAX   Class I Obesity w/ Body mass index is 36.69 kg/m.: Will benefit with PCP follow-up, weight loss,healthy lifestyle  Mobility: PT Orders: Active  PT Follow up Rec: Acute Inpatient Rehab (3hours/Day)05/12/2024 1232   DVT prophylaxis:  Code Status:   Code Status: Full Code Family Communication: plan of care discussed with patient/husband at bedside. Patient status is: Remains hospitalized because of severity of illness Level of care: Telemetry Cardiac   Dispo: The patient is from: home            Anticipated disposition: TBD Objective: Vitals last 24 hrs: Vitals:   05/14/24 2054 05/15/24 0000 05/15/24 0456 05/15/24 0757  BP: (!) 121/51 (!) 148/64 131/63 128/66  Pulse: 81 (!) 105 74 79  Resp: 20 20 20  (!) 23  Temp: 98 F (36.7 C) 98 F (36.7 C) 98.2 F (36.8 C) 98 F (36.7 C)  TempSrc: Oral Oral Oral Oral  SpO2: 94%   96%  Weight: 118.9 kg  116 kg   Height: 5' 10 (1.778 m)       Physical Examination: General exam: alert awake, oriented, older than stated age HEENT:Oral mucosa moist, Ear/Nose WNL grossly Respiratory system: Bilaterally diminished BS, wheezing, use of accessory muscle Cardiovascular system: S1 & S2 +, No  JVD. Gastrointestinal system: Abdomen soft,NT,ND, BS+ Nervous System: Alert, awake, moving all extremities,and following commands. Extremities: extremities warm, leg edema mild Skin: No rashes,no icterus. MSK: Normal muscle bulk,tone, power   Medications reviewed:  Scheduled Meds:  budesonide  (PULMICORT ) nebulizer solution  0.5 mg Nebulization BID   busPIRone   15 mg Oral TID   Chlorhexidine  Gluconate Cloth  6 each Topical Q0600   enoxaparin  (LOVENOX ) injection  60 mg Subcutaneous Q24H   hydrocortisone   20 mg Oral Daily   And   hydrocortisone   10 mg Oral Daily   levothyroxine   50 mcg Oral QAC breakfast   linaclotide   145 mcg Oral QAC breakfast   LORazepam   1 mg Oral Once   midodrine   5 mg Oral BID WC   pantoprazole   40 mg Oral BID   polyethylene glycol  17 g Oral BID   revefenacin   175 mcg Nebulization Daily   rosuvastatin   20 mg Oral Daily   senna-docusate  2 tablet Oral QHS   topiramate   50 mg Oral Q lunch   Continuous Infusions: Diet: Diet Order             Diet Heart Fluid consistency: Thin  Diet effective now                    Data Reviewed: I have personally reviewed following labs and imaging studies ( see epic result tab) CBC: Recent Labs  Lab 05/11/24 1601 05/12/24 0416 05/13/24 1847 05/15/24 0753  WBC 9.2 8.4 11.9* 11.4*  NEUTROABS 6.8  --  8.8*  --   HGB 8.6* 9.0* 8.7* 9.5*  HCT 29.4* 30.6* 30.0* 32.7*  MCV 92.2 90.5 92.9 90.6  PLT 456* 459* 494* 485*   CMP: Recent Labs  Lab 05/11/24 1800 05/12/24 0416 05/13/24 0417 05/13/24 1847 05/14/24 0410 05/15/24 0244 05/15/24 0753  NA  --    < > 145 142 140 144 143  K  --    < > 4.0 3.9 4.3 3.8 3.8  CL  --    < > 102 98 98 96* 99  CO2  --    < > 34* 31 31 33* 32  GLUCOSE  --    < > 160* 138* 272* 116* 109*  BUN  --    < > 15 20 20 18 19   CREATININE  --    < > 0.90 0.87 0.86 0.69 0.81  CALCIUM   --    < > 8.8* 8.4* 8.5* 9.0 9.0  MG 2.2  --   --   --  2.3 2.4 2.3   < > = values in this interval  not displayed.   GFR: Estimated Creatinine Clearance: 100.7 mL/min (by C-G formula based on SCr of 0.81 mg/dL). Recent Labs  Lab 05/11/24 1601  AST 22  ALT 19  ALKPHOS 64  BILITOT 0.5  PROT 5.9*  ALBUMIN 2.8*    Recent Labs  Lab 05/11/24 1601  LIPASE 26  No results for input(s): AMMONIA  in the last 168 hours. Coagulation Profile: No results for input(s): INR, PROTIME in the last 168 hours. Unresulted Labs (From admission, onward)     Start     Ordered   05/16/24 0500  CBC  Daily,   R     Question:  Specimen collection method  Answer:  Lab=Lab collect   05/15/24 0719   05/16/24 0500  Basic metabolic panel with GFR  Daily,   R     Question:  Specimen collection method  Answer:  Lab=Lab collect   05/15/24 0719           Antimicrobials/Microbiology: Anti-infectives (From admission, onward)    None         Component Value Date/Time   SDES  04/23/2024 1229    URINE, CLEAN CATCH Performed at [facility], [address] [place], [place] [address]    [facility]  04/23/2024 1229    NONE Performed at [facility], [address] [place], [place] [address]    CULT 80,000 COLONIES/mL PROVIDENCIA STUARTII (A) 04/23/2024 1229   REPTSTATUS 04/25/2024 FINAL 04/23/2024 1229  Procedures: Procedure(s) (LRB): RIGHT HEART CATH (N/A)  Mennie LAMY, MD [facility] 05/15/2024, 9:01 AM

## 2024-05-15 NOTE — Plan of Care (Signed)

## 2024-05-15 NOTE — Consult Note (Signed)
 Consultation Note Date: 05/15/2024   Patient Name: Kathleen [name]  DOB: 03-28-1961  MRN: [id number]  Age / Sex: 63 y.o., female  PCP: Alphonsa Glendia LABOR, MD Referring Physician: Christobal Guadalajara, MD  Reason for Consultation: Establishing goals of care  HPI/Patient Profile: 63 y.o. female  with past medical history of heart failure, paroxysmal atrial fibrillation, pulm sarcoidosis, pulmonary hypertension, OSA, chronic respiratory failure on oxygen , adrenal insufficiency, chronic pain presented secondary to shortness of breath and swelling, found to have evidence of acute on chronic heart failure  admitted on 05/11/2024 with acute on chronic heart failure.  Previously on prednisone  but had AVN of hips and multiple compression fracture. Of note  left femoral head avascular necrosis was evaluated by ortho and not a candidate for surgery. Since that time she has been severely limited. She is bedbound except for Kathleen [name]. Her husband provides 24 hour care.   She has had multiple recent hospitalizations. PMT consulted to discuss GOC.   Clinical Assessment and Goals of Care: I have reviewed medical records including EPIC notes, labs and imaging, received report from RN, assessed the patient and then met with patient and her spouse Kathleen [name]  to discuss diagnosis prognosis, GOC, EOL wishes, disposition and options.  I introduced Palliative Medicine as specialized medical care for people living with serious illness. It focuses on providing relief from the symptoms and stress of a serious illness. The goal is to improve quality of life for both the patient and the family.  Patient's mental status is slightly altered, but more alert than she was earlier. Spouse assisting with conversation.  We discussed a brief life review of the patient. Patient and Kathleen [name] are unsure how long they have been married, at least 20 years.   As far as functional and nutritional status, they speak of her  being bedbound and Kathleen [name] serving as caregiver. She tells me she has been this way at least a year. Kathleen [name] tells me she has had good appetite. He tells me she is typically not confused.     We discussed patient's current illness and what it means in the larger context of patient's on-going co-morbidities.  Natural disease trajectory and expectations at EOL were discussed. We speak of her heart failure and lung disease. We discuss worsening of her condition. We discuss her frequent hospitalizations. Kathleen [name] recognizes that time between hospitalizations is shortening.   I attempted to elicit values and goals of care important to the patient.    Kathleen [name] and patient both speak of heart cath and how they are hopeful this provide information into why she has gotten so much worse recently. We discuss unclear the benefit of this, if it would change management options for her.   Encouraged patient/family to consider DNR/DNI status understanding evidenced based poor outcomes in similar hospitalized patients, as the cause of the arrest is likely associated with chronic/terminal disease rather than a reversible acute cardio-pulmonary event.   Patient tells me she would want resuscitation attempts but asks Kathleen [name] what he thinks, he defers to her. At one point in conversation she does say no ventilators. We review wants resuscitation attempts, ventilators are a part of that. She tells me she was intubated before and came off it then and would hope for the same again. I share my concern that if she were to be so sick she required such aggressive medical care I worry if she would survive it, or if she did survive it what her quality of life would be.  I encouraged Kathleen [name] and Kathleen [name] to continue to have conversations about what boundaries she may have around her medical care. She tells me they have never talked about this before and it is difficult to consider.   Discussed with them the importance of continued conversation with  family and the medical providers regarding overall plan of care and treatment options, ensuring decisions are within the context of the patient's values and GOCs.    Questions and concerns were addressed. The family was encouraged to call with questions or concerns.   Shared conversation with Dr. Christobal and Dr. Zenaida - Dr. Zenaida plans to follow up for more conversation regarding her heart disease.   Primary Decision Maker PATIENT - joined by spouse Kathleen [name], she would want him to make decisions for her if she were unable    SUMMARY OF RECOMMENDATIONS   Initial goals of care discussion - very interested in heart cath and information this may provide to them They recognize worsening of her condition, more frequent hospitalizations Have not had much discussion before regarding advance directives/boundaries around medical care - DNR was discussed, need time to consider and more discussion  Code Status/Advance Care Planning: Full code      Primary Diagnoses: Present on Admission:  Chronic respiratory failure with hypoxia (HCC)  Adrenal insufficiency (Addison's disease) (HCC)  Bilateral lower extremity edema  Chronic pain syndrome  Hypothyroidism  Anxiety and depression  Obstructive sleep apnea  Interstitial lung disease (HCC)  Acute on chronic heart failure with preserved ejection fraction (HFpEF) (HCC)   I have reviewed the medical record, interviewed the patient and family, and examined the patient. The following aspects are pertinent.  Past Medical History:  Diagnosis Date   Adrenal insufficiency (HCC)    Anemia    Anxiety    Avascular bone necrosis (HCC)    Breast fibrocystic disorder    Chronic pain    Encounter for long-term opiate analgesic use 02/02/2020   Patient has chronic pain with femoral head avascular necrosis.  Is under pain contract   Fibromyalgia    Gastroesophageal reflux disease    Hypothyroidism    Mitral valve prolapse    Orthostatic hypotension     Peripheral neuropathy    PONV (postoperative nausea and vomiting)    Restrictive lung disease    Moderate to severe   Sarcoidosis    Biopsy proven - [facility]   Sarcoidosis    SOB (shortness of breath)    chronic   Social History   Socioeconomic History   Marital status: Married    Spouse name: Not on file   Number of children: 1   Years of education: Not on file   Highest education level: Not on file  Occupational History   Occupation: Midwife Payable    Employer: [employer]  Tobacco Use   Smoking status: Never    Passive exposure: Never   Smokeless tobacco: Never  Vaping Use   Vaping status: Never Used  Substance and Sexual Activity   Alcohol use: No   Drug use: No   Sexual activity: Yes    Birth control/protection: Post-menopausal  Other Topics Concern   Not on file  Social History Narrative   Daily caffeine    Living with husband   Right handed   She has been on disability since 2014.    Social Drivers of Health   Financial Resource Strain: Low Risk  (09/27/2023)   Overall Financial Resource Strain (CARDIA)    Difficulty of Paying  Living Expenses: Not hard at all  Food Insecurity: No Food Insecurity (05/11/2024)   Hunger Vital Sign    Worried About Running Out of Food in the Last Year: Never true    Ran Out of Food in the Last Year: Never true  Transportation Needs: No Transportation Needs (05/11/2024)   PRAPARE - Administrator, Civil Service (Medical): No    Lack of Transportation (Non-Medical): No  Physical Activity: Inactive (09/27/2023)   Exercise Vital Sign    Days of Exercise per Week: 0 days    Minutes of Exercise per Session: 0 min  Stress: No Stress Concern Present (09/27/2023)   [employer] of Occupational Health - Occupational Stress Questionnaire    Feeling of Stress : Only a little  Social Connections: Socially Integrated (01/21/2024)   Social Connection and Isolation Panel    Frequency of Communication with Friends and Family:  More than three times a week    Frequency of Social Gatherings with Friends and Family: More than three times a week    Attends Religious Services: More than 4 times per year    Active Member of [employer] or Organizations: Yes    Attends Engineer, structural: More than 4 times per year    Marital Status: Married   Family History  Problem Relation Age of Onset   Cardiomyopathy Mother    Breast cancer Mother    Prostate cancer Father    Heart attack Maternal Grandmother    Heart attack Maternal Grandfather    Heart attack Paternal Grandmother    Bipolar disorder Daughter    Heart disease Maternal Aunt    Neurofibromatosis Cousin    Colon cancer Neg Hx    Scheduled Meds:  budesonide  (PULMICORT ) nebulizer solution  0.5 mg Nebulization BID   busPIRone   15 mg Oral TID   Chlorhexidine  Gluconate Cloth  6 each Topical Q0600   enoxaparin  (LOVENOX ) injection  60 mg Subcutaneous Q24H   furosemide   80 mg Intravenous BID   hydrocortisone   20 mg Oral Daily   And   hydrocortisone   10 mg Oral Daily   levothyroxine   50 mcg Oral QAC breakfast   linaclotide   145 mcg Oral QAC breakfast   midodrine   5 mg Oral BID WC   pantoprazole   40 mg Oral BID   polyethylene glycol  17 g Oral BID   revefenacin   175 mcg Nebulization Daily   rosuvastatin   20 mg Oral Daily   senna-docusate  2 tablet Oral QHS   topiramate   50 mg Oral Q lunch   Continuous Infusions: PRN Meds:.acetaminophen  **OR** acetaminophen , albuterol , alum & mag hydroxide-simeth, hydrOXYzine , ketorolac , Muscle Rub, nitroGLYCERIN , ondansetron  **OR** ondansetron  (ZOFRAN ) IV, oxyCODONE  Allergies  Allergen Reactions   Bee Venom Anaphylaxis   Contrast Media [Iodinated Contrast Media] Other (See Comments)    CT chest w/ contrast at OSH followed by SOB resolved w/ single dose steroids, never ENT swelling, intubation or pressors, has had multiple contrasted scans since   Bactrim  [Sulfamethoxazole -Trimethoprim ] Nausea And Vomiting   Codeine  Itching   Review of Systems  Constitutional:  Positive for activity change and fatigue.  Respiratory:  Positive for shortness of breath.     Physical Exam Constitutional:      General: She is not in acute distress.    Appearance: She is ill-appearing.     Comments: Drowsy but wakes easily to voice  Cardiovascular:     Rate and Rhythm: Normal rate and regular rhythm.  Pulmonary:  Comments: Remains on 5 L [place] Skin:    General: Skin is warm and dry.  Neurological:     Comments: Intermittent confusion     Vital Signs: BP 121/65 (BP Location: Left Arm)   Pulse 84   Temp 97.8 F (36.6 C) (Oral)   Resp 20   Ht 5' 10 (1.778 m)   Wt 116 kg   LMP 05/17/2016   SpO2 98%   BMI 36.69 kg/m  Pain Scale: 0-10   Pain Score: 0-No pain   SpO2: SpO2: 98 % O2 Device:SpO2: 98 % O2 Flow Rate: .O2 Flow Rate (L/min): 5 L/min  IO: Intake/output summary:  Intake/Output Summary (Last 24 hours) at 05/15/2024 1622 Last data filed at 05/15/2024 1100 Gross per 24 hour  Intake 570 ml  Output 9210 ml  Net -8640 ml    LBM: Last BM Date : 05/14/24 Baseline Weight: Weight: 123.4 kg Most recent weight: Weight: 116 kg     Palliative Assessment/Data: PPS 40%     *Please note that this is a verbal dictation therefore any spelling or grammatical errors are due to the Dragon Medical One system interpretation.   Time Total: 65 minutes Time spent includes: Detailed review of medical records (labs, imaging, vital signs), medically appropriate exam, discussion with treatment team, counseling and educating patient, family and/or staff, documenting clinical information, medication management and coordination of care.    Tobey Jama Barnacle, DNP, AGNP-C Palliative Medicine Team [phone] Pager: [phone]

## 2024-05-16 DIAGNOSIS — I5033 Acute on chronic diastolic (congestive) heart failure: Secondary | ICD-10-CM | POA: Diagnosis not present

## 2024-05-16 LAB — BASIC METABOLIC PANEL WITH GFR
Anion gap: 17 — ABNORMAL HIGH (ref 5–15)
BUN: 21 mg/dL (ref 8–23)
CO2: 31 mmol/L (ref 22–32)
Calcium: 9.1 mg/dL (ref 8.9–10.3)
Chloride: 94 mmol/L — ABNORMAL LOW (ref 98–111)
Creatinine, Ser: 0.93 mg/dL (ref 0.44–1.00)
GFR, Estimated: >60 mL/min (ref >60)
Glucose, Bld: 121 mg/dL — ABNORMAL HIGH (ref 70–99)
Potassium: 3.4 mmol/L — ABNORMAL LOW (ref 3.5–5.1)
Sodium: 142 mmol/L (ref 135–145)

## 2024-05-16 LAB — CBC
HCT: 34.8 % — ABNORMAL LOW (ref 36.0–46.0)
Hemoglobin: 10.4 g/dL — ABNORMAL LOW (ref 12.0–15.0)
MCH: 26.5 pg (ref 26.0–34.0)
MCHC: 29.9 g/dL — ABNORMAL LOW (ref 30.0–36.0)
MCV: 88.5 fL (ref 80.0–100.0)
Platelets: 500 K/uL — ABNORMAL HIGH (ref 150–400)
RBC: 3.93 MIL/uL (ref 3.87–5.11)
RDW: 17.1 % — ABNORMAL HIGH (ref 11.5–15.5)
WBC: 13.6 K/uL — ABNORMAL HIGH (ref 4.0–10.5)
nRBC: 0 % (ref 0.0–0.2)

## 2024-05-16 LAB — HEPARIN LEVEL (UNFRACTIONATED): Heparin Unfractionated: 0.28 [IU]/mL — ABNORMAL LOW (ref 0.30–0.70)

## 2024-05-16 MED ORDER — ALUM & MAG HYDROXIDE-SIMETH 200-200-20 MG/5ML PO SUSP: 15.0000 mL | Freq: Once | ORAL | Status: DC

## 2024-05-16 MED ORDER — HEPARIN (PORCINE) 25000 UT/250ML-% IV SOLN
1650.0000 [IU]/h | INTRAVENOUS | Status: DC
  Administered 2024-05-16: 1400 [IU]/h via INTRAVENOUS
  Administered 2024-05-17: 1500 [IU]/h via INTRAVENOUS
  Filled 2024-05-16 (×2): qty 250

## 2024-05-16 MED ORDER — FUROSEMIDE 10 MG/ML IJ SOLN
40.0000 mg | Freq: Two times a day (BID) | INTRAMUSCULAR | Status: AC
  Administered 2024-05-16 – 2024-05-17 (×2): 40 mg via INTRAVENOUS
  Filled 2024-05-16 (×2): qty 4

## 2024-05-16 MED ORDER — POTASSIUM CHLORIDE CRYS ER 20 MEQ PO TBCR
40.0000 meq | EXTENDED_RELEASE_TABLET | Freq: Once | ORAL | Status: AC
  Administered 2024-05-16: 40 meq via ORAL
  Filled 2024-05-16: qty 2

## 2024-05-16 MED ORDER — HEPARIN BOLUS VIA INFUSION
3000.0000 [IU] | Freq: Once | INTRAVENOUS | Status: AC
Start: 2024-05-16 — End: 2024-05-16
  Administered 2024-05-16: 3000 [IU] via INTRAVENOUS
  Filled 2024-05-16: qty 3000

## 2024-05-16 NOTE — Progress Notes (Addendum)
 ANTICOAGULATION CONSULT NOTE  Pharmacy Consult for heparin  Indication: atrial fibrillation  Allergies  Allergen Reactions   Bee Venom Anaphylaxis   Contrast Media [Iodinated Contrast Media] Other (See Comments)    CT chest w/ contrast at OSH followed by SOB resolved w/ single dose steroids, never ENT swelling, intubation or pressors, has had multiple contrasted scans since   Bactrim  [Sulfamethoxazole -Trimethoprim ] Nausea And Vomiting   Codeine Itching    Patient Measurements: Height: 5' 10 (177.8 cm) Weight: 110.6 kg (243 lb 13.3 oz) IBW/kg (Calculated) : 68.5 Heparin  Dosing Weight: 95.6 kg  Vital Signs: Temp: 97.5 F (36.4 C) (09/20 0829) Temp Source: Oral (09/20 0829) BP: 107/51 (09/20 0829) Pulse Rate: 90 (09/20 0829)  Labs: Recent Labs    05/13/24 1847 05/14/24 0410 05/15/24 0244 05/15/24 0753 05/16/24 0303  HGB 8.7*  --   --  9.5* 10.4*  HCT 30.0*  --   --  32.7* 34.8*  PLT 494*  --   --  485* 500*  CREATININE 0.87   < > 0.69 0.81 0.93   < > = values in this interval not displayed.    Estimated Creatinine Clearance: 85.5 mL/min (by C-G formula based on SCr of 0.93 mg/dL).  Medical History: Past Medical History:  Diagnosis Date   Adrenal insufficiency (HCC)    Anemia    Anxiety    Avascular bone necrosis (HCC)    Breast fibrocystic disorder    Chronic pain    Encounter for long-term opiate analgesic use 02/02/2020   Patient has chronic pain with femoral head avascular necrosis.  Is under pain contract   Fibromyalgia    Gastroesophageal reflux disease    Hypothyroidism    Mitral valve prolapse    Orthostatic hypotension    Peripheral neuropathy    PONV (postoperative nausea and vomiting)    Restrictive lung disease    Moderate to severe   Sarcoidosis    Biopsy proven - [facility]   Sarcoidosis    SOB (shortness of breath)    chronic    Medications:  See MAR  Assessment: 63 yo female presents with acute on chronic HFpEF now with paroxysmal AF RVR  episode this morning 9/20 .  Not on anticoagulation prior to admission.  Pharmacy consulted for heparin  dosing.  Per chart review, only PMH of atrial fibrillation was on presentation to [facility] in 2017.  Patient was started on amiodarone  and heparin  infusion and transferred to [facility] for further workup but self converted to SR upon transfer at which time heparin  and amiodarone  were discontinued and not restarted.  No further mention of Afib since.  Received Lovenox  60 mg ppx dose 9/19 @2213 . Baseline Hgb 10.4 (improved from 9.5; 8.6 on admission), pltc 500.   Goal of Therapy:  Heparin  level 0.3-0.7 units/ml Monitor platelets by anticoagulation protocol: Yes   Plan:  Heparin  bolus 3000 units x1 Heparin  infusion 1450 units/hr 6 hour anti-Xa level Daily CBC, anti-Xa level Monitor for s/sx of bleeding  Maurilio Fila, PharmD Clinical Pharmacist 05/16/2024  10:29 AM

## 2024-05-16 NOTE — Plan of Care (Signed)
   Problem: Education: Goal: Knowledge of General Education information will improve Description: Including pain rating scale, medication(s)/side effects and non-pharmacologic comfort measures Outcome: Not Progressing

## 2024-05-16 NOTE — Progress Notes (Signed)
 Progress Note  Patient Name: Kathleen [name] Date of Encounter: 05/16/2024 [facility] Cardiologist: None (New to CHF - Zenaida)  Interval Summary    Continues to have good diuresis with net negative balance of 1.2 L in last 24 hours and 23 L since admission.  Weight is down 13 kg since admission. BP borderline low, but she only just received her midodrine  less than 30 minutes ago. Brief bursts of paroxysmal atrial fibrillation with rapid ventricular response and some aberrantly conducted beats this morning, lasting less than 2-3 minutes.  She is unaware of the arrhythmia.   Vital Signs Vitals:   05/16/24 0500 05/16/24 0600 05/16/24 0823 05/16/24 0829  BP:    (!) 107/51  Pulse:    90  Resp: (!) 25 (!) 23  15  Temp:    (!) 97.5 F (36.4 C)  TempSrc:    Oral  SpO2:   98%   Weight:      Height:        Intake/Output Summary (Last 24 hours) at 05/16/2024 0916 Last data filed at 05/16/2024 9377 Gross per 24 hour  Intake 360 ml  Output 1575 ml  Net -1215 ml      05/16/2024    3:51 AM 05/15/2024    4:56 AM 05/14/2024    8:54 PM  Last 3 Weights  Weight (lbs) 243 lb 13.3 oz 255 lb 11.7 oz 262 lb 2 oz  Weight (kg) 110.6 kg 116 kg 118.9 kg      Telemetry/ECG  Mostly normal sinus rhythm with frequent PACs, did have a couple of episodes of paroxysmal atrial fibrillation with RVR and some aberrant conduction this morning, each episode lasting 30 seconds - 2 minutes - Personally Reviewed  Physical Exam  GEN: No acute distress.   Neck: No overt JVD Cardiac: Occasional ectopy on a background of RRR, no murmurs, rubs, or gallops.  Respiratory: Clear to auscultation bilaterally. GI: Soft, nontender, non-distended  MS: No lower extremity edema  Assessment & Plan  63 year old woman with a history of HFpEF, chronic respiratory failure on home oxygen , pulmonary sarcoidosis on prednisone  therapy complicated by avascular necrosis of hips and compression fractures, now essentially  bedbound, history of paroxysmal atrial fibrillation, HTN, adrenal insufficiency, OSA admitted 05/11/2024 with acute heart failure exacerbation, responding well to intravenous diuretic therapy.  Has been mostly in sinus rhythm during this admission but did have a very brief bursts of paroxysmal atrial fibrillation with RVR this morning in the setting of mild hypokalemia.  Although atrial fibrillation has been previously documented in her records, she was not on chronic anticoagulation.  I suspect it was felt that the arrhythmia was only associated with acute critical illness in the past.  Since there is still a possibility she will require right heart catheterization, will use intravenous heparin  rather than direct oral anticoagulants, which she should be on long-term.  Plans for right heart catheterization currently on hold since there would likely be limited impact on treatment plan, even though the procedure with help distinguish whether she has pure right heart failure from her chronic lung problems versus also contribution of diastolic heart failure to her pulmonary hypertension. Has required midodrine  support for hypotension during aggressive diuresis. There is little in the way of signs of right heart failure on her physical exam today.  Continues to be borderline hypotensive.  Will reduce the dose of furosemide  to 40 mg.    For questions or updates, please contact [facility] Please consult [email or web address]  for contact info under         Signed, Jerel Balding, MD

## 2024-05-16 NOTE — Plan of Care (Signed)
  Problem: Clinical Measurements: Goal: Ability to maintain clinical measurements within normal limits will improve Outcome: Progressing Goal: Will remain free from infection Outcome: Progressing   Problem: Nutrition: Goal: Adequate nutrition will be maintained Outcome: Progressing   Problem: Coping: Goal: Level of anxiety will decrease Outcome: Progressing   Problem: Elimination: Goal: Will not experience complications related to bowel motility Outcome: Progressing Goal: Will not experience complications related to urinary retention Outcome: Progressing   Problem: Safety: Goal: Ability to remain free from injury will improve Outcome: Progressing   Problem: Skin Integrity: Goal: Risk for impaired skin integrity will decrease Outcome: Progressing   Problem: Education: Goal: Ability to demonstrate management of disease process will improve Outcome: Progressing Goal: Ability to verbalize understanding of medication therapies will improve Outcome: Progressing   Problem: Cardiac: Goal: Ability to achieve and maintain adequate cardiopulmonary perfusion will improve Outcome: Progressing   Problem: Clinical Measurements: Goal: Respiratory complications will improve Outcome: Not Progressing Goal: Cardiovascular complication will be avoided Outcome: Not Progressing   Problem: Activity: Goal: Risk for activity intolerance will decrease Outcome: Not Progressing   Problem: Pain Managment: Goal: General experience of comfort will improve and/or be controlled Outcome: Not Progressing   Problem: Activity: Goal: Capacity to carry out activities will improve Outcome: Not Progressing

## 2024-05-16 NOTE — Progress Notes (Signed)
 PROGRESS NOTE Kathleen [name]  FMW:[id number] DOB: [birth year] DOA: 05/11/2024 PCP: Alphonsa Glendia LABOR, MD  Brief Narrative/Hospital Course: Kathleen [name] is a 63 y.o. female with PMH of heart failure, paroxysmal atrial fibrillation, sarcoidosis, chronic respiratory failure on oxygen , adrenal insufficiency, chronic pain presented secondary to shortness of breath and swelling, found to have evidence of acute on chronic heart failure, cardiology was consulted, patient was admitted and started on IV diuretics Patient is very ill with complex medical issues, with pulmonary sarcoidosis, previously on steroid and had AVN of the hip and multiple compression fracture, orthopedic felt not a candidate for surgery for AVN and has severely limited mobility, mostly bedbound except for [facility].  She has chronic hypoxic tori failure on supplemental oxygen .. 9/19> transferred from [facility] to [facility] for advanced heart failure team evaluation  Subjective: Seen and examined  Patient lying flat on the bed, husband at the bedside, she reports she feels better in terms of breathing Denies chest pain nausea vomiting.  Leg swelling has improved They are hopeful to get some answers from cardiac cath Overnight afebrile BP soft 100-1 20s, remains tachypneic in mid 20s Labs reviewed with hypokalemia 3.4 mild leukocytosis thrombocytosis and hemoglobin 10.4 slightly up 08 further down 118>116>110 kg  Assessment and plan:  Acute on chronic HFpEF Left small effusion/pulmonary congestion Chronic dyspnea Recent admissions for acute on chronic HFpEF Mild hypokalemia from diuresis: PTA 22 LB weight gain w/ recent echo August EF 60 to 65% indeterminate diastolic function, elevated PASP by echo in the past. Manage seen for acute on chronic HFpEF with diuretics>transferred to [facility] for AHF team eva and RHC 9/19 recent pulmonary eval at [facility] requesting RHC to eval for underlying pulmonary hypertension given her sarcoid/ILD. Patient  had significant diuresis, improving weight with high-dose Lasix  8mg   q 12h AHF following w/ plan for RHC  Cont to monitor daily I/O,weight, electrolytes and replaceKeep on  salt/fluid restricted diet and monitor in tele. Net IO Since Admission: -23,180 mL [05/16/24 0941]  Filed Weights   05/14/24 2054 05/15/24 0456 05/16/24 0351  Weight: 118.9 kg 116 kg 110.6 kg    Recent Labs  Lab 05/11/24 1601 05/11/24 1800 05/12/24 0416 05/13/24 1847 05/14/24 0410 05/15/24 0244 05/15/24 0753 05/16/24 0303  BNP 53.0  --   --   --   --   --   --   --   BUN 5*  --    < > 20 20 18 19 21   CREATININE 0.70  --    < > 0.87 0.86 0.69 0.81 0.93  K 3.6  --    < > 3.9 4.3 3.8 3.8 3.4*  MG  --  2.2  --   --  2.3 2.4 2.3  --    < > = values in this interval not displayed.     Pulmonary sarcoidosis/ILD Chronic hypoxic respiratory failure on 5 L [place]: Continue supplemental oxygen .She is followed by [name]  has been stopped, cannot tolerate methotrexate due to diarrhea hair loss, cannot tolerate CellCept due to diarrhea and has been continued on nebs, supportive care, advised pulmonary rehab by [facility] pulmonology.  Last CT chest 5/26 /25> showed similar appearing chronic bilateral upper lobe paramediastinal architectural distortion, bronchiectasis and peribronchial consolidation/scaring  Normocytic anemia: Question from chronic illness.  No evidence of acute blood loss.Hemoglobin stable around 8 to 10 g.  Monitor  Lethargy Weakness: Hold off on iv pain meds.  Monitor  Goals of care Bed bound w/ dyspnea Ambulatory  dysfunction Frequent hospitalizations-she had 9 hospitalizations since January this year : Overall poor prognosis. Consulted palliative care, encourage to discuss goals of care and CODE STATUS . Patient remains at high risk for decompensation, risk for frequent admissions. She is at full code but I am very concerned , overall prognosis does not appear bright-informed patient's husband,  palliative consulted family waiting on cardiac catheter to get some answers, and see if problem can be fixed Discussed with advanced heart failure team  OSA: Not using CPAP.  Chronic pain syndrome Anxiety/depression Mood stable continue her BuSpar , Topamax  Continue home oxy, multimodal pain management-hold for sedation  Adrenal insufficiency Hypothyroidism Chronic hypotension: Continue her home Synthroid , midodrine  and Cortef . Received IV Solu-Medrol  this admission  HLD continue statin  Constipation: continue Linzess , MiraLAX   Class I Obesity w/ Body mass index is 34.99 kg/m.: Will benefit with PCP follow-up, weight loss,healthy lifestyle  Mobility: PT Orders: Active PT Follow up Rec: Home Health Pt9/19/2025 1519   DVT prophylaxis:  Code Status:   Code Status: Full Code Family Communication: plan of care discussed with patient/husband at bedside. Patient status is: Remains hospitalized because of severity of illness Level of care: Telemetry Cardiac   Dispo: The patient is from: home            Anticipated disposition: TBD Objective: Vitals last 24 hrs: Vitals:   05/16/24 0500 05/16/24 0600 05/16/24 0823 05/16/24 0829  BP:    (!) 107/51  Pulse:    90  Resp: (!) 25 (!) 23  15  Temp:    (!) 97.5 F (36.4 C)  TempSrc:    Oral  SpO2:   98%   Weight:      Height:        Physical Examination: General exam: Alert awake, appears older than her age and chronically sick looking, on oxygen   HEENT:Oral mucosa moist, Ear/Nose WNL grossly Respiratory system: B/L clear BS, wheezing, use of accessory muscle Cardiovascular system: S1 & S2 +, No JVD. Gastrointestinal system: Abdomen soft,NT,ND, BS+ Nervous System: Bit sleepy but able to wake up and interact, moving extremities Extremities: extremities warm, leg edema improved Skin: No rashes,no icterus. MSK: Normal muscle bulk,tone, power   Medications reviewed:  Scheduled Meds:  budesonide  (PULMICORT ) nebulizer  solution  0.5 mg Nebulization BID   busPIRone   15 mg Oral TID   Chlorhexidine  Gluconate Cloth  6 each Topical Q0600   enoxaparin  (LOVENOX ) injection  60 mg Subcutaneous Q24H   furosemide   80 mg Intravenous BID   hydrocortisone   20 mg Oral Daily   And   hydrocortisone   10 mg Oral Daily   levothyroxine   50 mcg Oral QAC breakfast   linaclotide   145 mcg Oral QAC breakfast   midodrine   5 mg Oral BID WC   pantoprazole   40 mg Oral BID   polyethylene glycol  17 g Oral BID   potassium chloride   40 mEq Oral Once   revefenacin   175 mcg Nebulization Daily   rosuvastatin   20 mg Oral Daily   senna-docusate  2 tablet Oral QHS   topiramate   50 mg Oral Q lunch   Continuous Infusions: Diet: Diet Order             Diet Heart Fluid consistency: Thin  Diet effective now                   Data Reviewed: I have personally reviewed following labs and imaging studies ( see epic result tab) CBC: Recent Labs  Lab  05/11/24 1601 05/12/24 0416 05/13/24 1847 05/15/24 0753 05/16/24 0303  WBC 9.2 8.4 11.9* 11.4* 13.6*  NEUTROABS 6.8  --  8.8*  --   --   HGB 8.6* 9.0* 8.7* 9.5* 10.4*  HCT 29.4* 30.6* 30.0* 32.7* 34.8*  MCV 92.2 90.5 92.9 90.6 88.5  PLT 456* 459* 494* 485* 500*   CMP: Recent Labs  Lab 05/11/24 1800 05/12/24 0416 05/13/24 1847 05/14/24 0410 05/15/24 0244 05/15/24 0753 05/16/24 0303  NA  --    < > 142 140 144 143 142  K  --    < > 3.9 4.3 3.8 3.8 3.4*  CL  --    < > 98 98 96* 99 94*  CO2  --    < > 31 31 33* 32 31  GLUCOSE  --    < > 138* 272* 116* 109* 121*  BUN  --    < > 20 20 18 19 21   CREATININE  --    < > 0.87 0.86 0.69 0.81 0.93  CALCIUM   --    < > 8.4* 8.5* 9.0 9.0 9.1  MG 2.2  --   --  2.3 2.4 2.3  --    < > = values in this interval not displayed.   GFR: Estimated Creatinine Clearance: 85.5 mL/min (by C-G formula based on SCr of 0.93 mg/dL). Recent Labs  Lab 05/11/24 1601  AST 22  ALT 19  ALKPHOS 64  BILITOT 0.5  PROT 5.9*  ALBUMIN 2.8*    Recent  Labs  Lab 05/11/24 1601  LIPASE 26   No results for input(s): AMMONIA  in the last 168 hours. Coagulation Profile: No results for input(s): INR, PROTIME in the last 168 hours. Unresulted Labs (From admission, onward)     Start     Ordered   05/16/24 0500  CBC  Daily,   R     Question:  Specimen collection method  Answer:  Lab=Lab collect   05/15/24 0719   05/16/24 0500  Basic metabolic panel with GFR  Daily,   R     Question:  Specimen collection method  Answer:  Lab=Lab collect   05/15/24 0719           Antimicrobials/Microbiology: Anti-infectives (From admission, onward)    None         Component Value Date/Time   SDES  04/23/2024 1229    URINE, CLEAN CATCH Performed at [facility], [address], [place], [place] [address]    [facility]  04/23/2024 1229    NONE Performed at [facility], [address], [place], [place] [address]    CULT 80,000 COLONIES/mL PROVIDENCIA STUARTII (A) 04/23/2024 1229   REPTSTATUS 04/25/2024 FINAL 04/23/2024 1229    Procedures: Procedure(s) (LRB): RIGHT HEART CATH (N/A)   Mennie LAMY, MD [facility] 05/16/2024, 9:42 AM

## 2024-05-16 NOTE — Progress Notes (Signed)
 ANTICOAGULATION CONSULT NOTE  Pharmacy Consult for heparin  Indication: atrial fibrillation  Allergies  Allergen Reactions   Bee Venom Anaphylaxis   Contrast Media [Iodinated Contrast Media] Other (See Comments)    CT chest w/ contrast at OSH followed by SOB resolved w/ single dose steroids, never ENT swelling, intubation or pressors, has had multiple contrasted scans since   Bactrim  [Sulfamethoxazole -Trimethoprim ] Nausea And Vomiting   Codeine Itching    Patient Measurements: Height: 5' 10 (177.8 cm) Weight: 110.6 kg (243 lb 13.3 oz) IBW/kg (Calculated) : 68.5 Heparin  Dosing Weight: 95.6 kg  Vital Signs: Temp: 98.6 F (37 C) (09/20 2005) Temp Source: Oral (09/20 2005) BP: 121/66 (09/20 2005) Pulse Rate: 80 (09/20 2005)  Labs: Recent Labs    05/15/24 0244 05/15/24 0753 05/16/24 0303 05/16/24 1831  HGB  --  9.5* 10.4*  --   HCT  --  32.7* 34.8*  --   PLT  --  485* 500*  --   HEPARINUNFRC  --   --   --  0.28*  CREATININE 0.69 0.81 0.93  --     Estimated Creatinine Clearance: 85.5 mL/min (by C-G formula based on SCr of 0.93 mg/dL).  Medical History: Past Medical History:  Diagnosis Date   Adrenal insufficiency (HCC)    Anemia    Anxiety    Avascular bone necrosis (HCC)    Breast fibrocystic disorder    Chronic pain    Encounter for long-term opiate analgesic use 02/02/2020   Patient has chronic pain with femoral head avascular necrosis.  Is under pain contract   Fibromyalgia    Gastroesophageal reflux disease    Hypothyroidism    Mitral valve prolapse    Orthostatic hypotension    Peripheral neuropathy    PONV (postoperative nausea and vomiting)    Restrictive lung disease    Moderate to severe   Sarcoidosis    Biopsy proven - [facility]   Sarcoidosis    SOB (shortness of breath)    chronic    Medications:  See MAR  Assessment: 63 yo female presents with acute on chronic HFpEF now with paroxysmal AF RVR episode this morning 9/20 .  Not on  anticoagulation prior to admission.  Pharmacy consulted for heparin  dosing.  Per chart review, only PMH of atrial fibrillation was on presentation to [facility] ED in 2017.  Patient was started on amiodarone  and heparin  infusion and transferred to [facility] for further workup but self converted to SR upon transfer at which time heparin  and amiodarone  were discontinued and not restarted.  No further mention of Afib since.  Received Lovenox  60 mg ppx dose 9/19 @2213 . Baseline Hgb 10.4 (improved from 9.5; 8.6 on admission), pltc 500.   Heparin  drip 1400 uts/hr with heparin  level 0.28 slightly < goal    Goal of Therapy:  Heparin  level 0.3-0.7 units/ml Monitor platelets by anticoagulation protocol: Yes   Plan:  Increase Heparin  infusion 1500 units/hr Daily CBC, anti-Xa level Monitor for s/sx of bleeding   Olam Chalk Pharm.D. CPP, BCPS Clinical Pharmacist [phone] 05/16/2024 9:06 PM

## 2024-05-17 DIAGNOSIS — I5033 Acute on chronic diastolic (congestive) heart failure: Secondary | ICD-10-CM | POA: Diagnosis not present

## 2024-05-17 LAB — BASIC METABOLIC PANEL WITH GFR
Anion gap: 11 (ref 5–15)
BUN: 19 mg/dL (ref 8–23)
CO2: 32 mmol/L (ref 22–32)
Calcium: 9 mg/dL (ref 8.9–10.3)
Chloride: 95 mmol/L — ABNORMAL LOW (ref 98–111)
Creatinine, Ser: 1.08 mg/dL — ABNORMAL HIGH (ref 0.44–1.00)
GFR, Estimated: 58 mL/min — ABNORMAL LOW (ref >60)
Glucose, Bld: 146 mg/dL — ABNORMAL HIGH (ref 70–99)
Potassium: 3.6 mmol/L (ref 3.5–5.1)
Sodium: 138 mmol/L (ref 135–145)

## 2024-05-17 LAB — CBC
HCT: 31.6 % — ABNORMAL LOW (ref 36.0–46.0)
Hemoglobin: 9.7 g/dL — ABNORMAL LOW (ref 12.0–15.0)
MCH: 26.8 pg (ref 26.0–34.0)
MCHC: 30.7 g/dL (ref 30.0–36.0)
MCV: 87.3 fL (ref 80.0–100.0)
Platelets: 427 K/uL — ABNORMAL HIGH (ref 150–400)
RBC: 3.62 MIL/uL — ABNORMAL LOW (ref 3.87–5.11)
RDW: 17.1 % — ABNORMAL HIGH (ref 11.5–15.5)
WBC: 12.1 K/uL — ABNORMAL HIGH (ref 4.0–10.5)
nRBC: 0 % (ref 0.0–0.2)

## 2024-05-17 LAB — HEPARIN LEVEL (UNFRACTIONATED)
Heparin Unfractionated: 0.28 [IU]/mL — ABNORMAL LOW (ref 0.30–0.70)
Heparin Unfractionated: 0.12 [IU]/mL — ABNORMAL LOW (ref 0.30–0.70)
Heparin Unfractionated: 0.2 [IU]/mL — ABNORMAL LOW (ref 0.30–0.70)
Heparin Unfractionated: 0.34 [IU]/mL (ref 0.30–0.70)

## 2024-05-17 MED ORDER — HEPARIN (PORCINE) 25000 UT/250ML-% IV SOLN
2050.0000 [IU]/h | INTRAVENOUS | Status: DC
  Administered 2024-05-17 – 2024-05-19 (×4): 1850 [IU]/h via INTRAVENOUS
  Filled 2024-05-17 (×3): qty 250

## 2024-05-17 MED ORDER — ALUM & MAG HYDROXIDE-SIMETH 200-200-20 MG/5ML PO SUSP
15.0000 mL | Freq: Once | ORAL | Status: AC
  Administered 2024-05-17: 15 mL via ORAL
  Filled 2024-05-17: qty 30

## 2024-05-17 MED ORDER — POTASSIUM CHLORIDE CRYS ER 20 MEQ PO TBCR
40.0000 meq | EXTENDED_RELEASE_TABLET | Freq: Once | ORAL | Status: AC
  Administered 2024-05-17: 40 meq via ORAL
  Filled 2024-05-17: qty 2

## 2024-05-17 MED ORDER — HEPARIN BOLUS VIA INFUSION
1400.0000 [IU] | Freq: Once | INTRAVENOUS | Status: AC
  Administered 2024-05-17: 1400 [IU] via INTRAVENOUS
  Filled 2024-05-17: qty 1400

## 2024-05-17 MED ORDER — ASPIRIN 81 MG PO CHEW
81.0000 mg | CHEWABLE_TABLET | ORAL | Status: AC
  Administered 2024-05-18: 81 mg via ORAL
  Filled 2024-05-17: qty 1

## 2024-05-17 MED ORDER — SODIUM CHLORIDE 0.9 % IV SOLN: INTRAVENOUS | Status: DC

## 2024-05-17 NOTE — Progress Notes (Signed)
 ANTICOAGULATION CONSULT NOTE  Pharmacy Consult for heparin  Indication: atrial fibrillation  Allergies  Allergen Reactions   Bee Venom Anaphylaxis   Contrast Media [Iodinated Contrast Media] Other (See Comments)    CT chest w/ contrast at OSH followed by SOB resolved w/ single dose steroids, never ENT swelling, intubation or pressors, has had multiple contrasted scans since   Bactrim  [Sulfamethoxazole -Trimethoprim ] Nausea And Vomiting   Codeine Itching    Patient Measurements: Height: 5' 10 (177.8 cm) Weight: 110.9 kg (244 lb 7.8 oz) IBW/kg (Calculated) : 68.5 Heparin  Dosing Weight: 95.6 kg  Vital Signs: Temp: 98 F (36.7 C) (09/21 0525) Temp Source: Oral (09/21 0525) BP: 123/52 (09/21 0525) Pulse Rate: 74 (09/21 0525)  Labs: Recent Labs    05/15/24 0753 05/16/24 0303 05/16/24 1831 05/17/24 0223  HGB 9.5* 10.4*  --  9.7*  HCT 32.7* 34.8*  --  31.6*  PLT 485* 500*  --  427*  HEPARINUNFRC  --   --  0.28* 0.28*  CREATININE 0.81 0.93  --  1.08*    Estimated Creatinine Clearance: 73.8 mL/min (A) (by C-G formula based on SCr of 1.08 mg/dL (H)).  Medical History: Past Medical History:  Diagnosis Date   Adrenal insufficiency (HCC)    Anemia    Anxiety    Avascular bone necrosis (HCC)    Breast fibrocystic disorder    Chronic pain    Encounter for long-term opiate analgesic use 02/02/2020   Patient has chronic pain with femoral head avascular necrosis.  Is under pain contract   Fibromyalgia    Gastroesophageal reflux disease    Hypothyroidism    Mitral valve prolapse    Orthostatic hypotension    Peripheral neuropathy    PONV (postoperative nausea and vomiting)    Restrictive lung disease    Moderate to severe   Sarcoidosis    Biopsy proven - [facility]   Sarcoidosis    SOB (shortness of breath)    chronic    Medications:  See MAR  Assessment: 63 yo female presents with acute on chronic HFpEF now with paroxysmal AF RVR episode this morning 9/20 .  Not on  anticoagulation prior to admission.  Pharmacy consulted for heparin  dosing.  Per chart review, only PMH of atrial fibrillation was on presentation to [facility] ED in 2017.  Patient was started on amiodarone  and heparin  infusion and transferred to [facility] for further workup but self converted to SR upon transfer at which time heparin  and amiodarone  were discontinued and not restarted.  No further mention of Afib since.  HL 0.28 ~4h after rate increase.  Repeat HL 0.12 below goal on 1500 units/hr.  No line issues or infusion pauses overnight. Hgb 9.4, pltc 427 - mostly stable, slight Hgb drop but no overt s/sx bleeding per RN.   Goal of Therapy:  Heparin  level 0.3-0.7 units/ml Monitor platelets by anticoagulation protocol: Yes   Plan:  Increase Heparin  infusion 1650 units/hr (more conservative given unexplained drop with rate increase) 6h anti-Xa level Daily CBC, anti-Xa level Monitor for s/sx of bleeding  Maurilio Fila, PharmD Clinical Pharmacist 05/17/2024  7:33 AM

## 2024-05-17 NOTE — Progress Notes (Signed)
 PROGRESS NOTE Kathleen [name]  FMW:[id number] DOB: 1961-08-08 DOA: 05/11/2024 PCP: Alphonsa Glendia LABOR, MD  Brief Narrative/Hospital Course: Kathleen [name] is a 63 y.o. female with PMH of heart failure, paroxysmal atrial fibrillation, sarcoidosis, chronic respiratory failure on oxygen , adrenal insufficiency, chronic pain presented secondary to shortness of breath and swelling, found to have evidence of acute on chronic heart failure, cardiology was consulted, patient was admitted and started on IV diuretics Patient is very ill with complex medical issues, with pulmonary sarcoidosis, previously on steroid and had AVN of the hip and multiple compression fracture, orthopedic felt not a candidate for surgery for AVN and has severely limited mobility, mostly bedbound except for [facility].  She has chronic hypoxic tori failure on supplemental oxygen .. 9/19> transferred from [facility] to [facility] for advanced heart failure team evaluation  Subjective: Seen and examined  Aaox3 asking questions,  I am not ready to go, I want to move forward I want heart cath Husband at bedside Overnight vital stable afebrile Labs this morning stable BMP CBC with mild leukocytosis, of note on steroid weight further down 118 KG- 272 LB>116>110 kg/244LB, Lasix  dose decreased 9/20  Assessment and plan:  Acute on chronic HFpEF Left small effusion/pulmonary congestion Chronic dyspnea Recent admissions for acute on chronic HFpEF Mild hypokalemia from diuresis: PTA 22 LB weight gain w/ recent echo August EF 60 to 65% indeterminate diastolic function, elevated PASP in past. Managed with diuresis, transferred to [facility] for AHF team eva and RHC 9/19 recent pulmonary eval at [facility] requesting RHC to eval for underlying pulmonary hypertension given her sarcoid/ILD. Patient had significant diuresis, improved weight with high-dose Lasix > with blood pressure being soft Lasix  dose decreased 9/20> plan to stop today per cardio Cardiology  following and managing.possible  RHC tomorrow. Cont to monitor daily I/O,weight, electrolytes and replaceKeep on  salt/fluid restricted diet and monitor in tele. Net IO Since Admission: -24,172.09 mL [05/17/24 1029]  Filed Weights   05/15/24 0456 05/16/24 0351 05/17/24 0525  Weight: 116 kg 110.6 kg 110.9 kg    Recent Labs  Lab 05/11/24 1601 05/11/24 1800 05/12/24 0416 05/14/24 0410 05/15/24 0244 05/15/24 0753 05/16/24 0303 05/17/24 0223  BNP 53.0  --   --   --   --   --   --   --   BUN 5*  --    < > 20 18 19 21 19   CREATININE 0.70  --    < > 0.86 0.69 0.81 0.93 1.08*  K 3.6  --    < > 4.3 3.8 3.8 3.4* 3.6  MG  --  2.2  --  2.3 2.4 2.3  --   --    < > = values in this interval not displayed.     Pulmonary sarcoidosis/ILD Chronic hypoxic respiratory failure on 5 L [place]: Continue supplemental oxygen .She is followed by [name]  has been stopped, cannot tolerate methotrexate due to diarrhea hair loss, cannot tolerate CellCept due to diarrhea and has been continued on nebs, supportive care, advised pulmonary rehab by [facility] pulmonology.  Last CT chest 5/26 /25> showed similar appearing chronic bilateral upper lobe paramediastinal architectural distortion, bronchiectasis and peribronchial consolidation/scaring  Normocytic anemia: Question from chronic illness. No evidence of acute blood loss.Hemoglobin stable, monitor Recent Labs  Lab 05/12/24 0416 05/13/24 1847 05/15/24 0753 05/16/24 0303 05/17/24 0223  HGB 9.0* 8.7* 9.5* 10.4* 9.7*  HCT 30.6* 30.0* 32.7* 34.8* 31.6*   Lethargy Weakness: Hold off on iv pain meds.  Monitor, encourage  PT OT likely reflecting her chronic illness  Goals of care Bed bound w/ dyspnea Ambulatory dysfunction Frequent hospitalizations-she had 9 hospitalizations since January this year : Overall poor prognosis. Consulted palliative care, encourage to discuss goals of care and CODE STATUS . Patient remains at high risk for decompensation, risk for  frequent admissions. She is at full code but I am very concerned , overall prognosis does not appear bright-informed patient's husband, palliative consulted has been consulted, continue to engage in goals of care discussion, await further insight and prognosis discussion from cardiology team as well.  Family waiting on cardiac catheter to get some answers, and see if problem can be fixed  OSA: Not using CPAP.  Chronic pain syndrome Anxiety/depression Mood stable continue her BuSpar , Topamax , and pain management as tolerated. PTA on  oxy  Adrenal insufficiency Hypothyroidism Chronic hypotension: Continue her synthroid , midodrine  and Cortef .  Received IV Solu-Medrol  this admission  HLD continue statin  Constipation: continue Linzess , MiraLAX .  Last bowel movement 9/20  Class I Obesity w/ Body mass index is 35.08 kg/m.: Will benefit with PCP follow-up, weight loss,healthy lifestyle  Mobility: PT Orders: Active PT Follow up Rec: Home Health Pt9/19/2025 1519   DVT prophylaxis:  Code Status:   Code Status: Full Code Family Communication: plan of care discussed with patient/husband at bedside. Patient status is: Remains hospitalized because of severity of illness Level of care: Telemetry Cardiac   Dispo: The patient is from: home            Anticipated disposition: TBD Objective: Vitals last 24 hrs: Vitals:   05/17/24 0002 05/17/24 0525 05/17/24 0821 05/17/24 0831  BP: (!) 121/59 (!) 123/52 (!) 117/54   Pulse: 83 74    Resp: 20  (!) 21   Temp: 98.1 F (36.7 C) 98 F (36.7 C)    TempSrc: Oral Oral    SpO2: 97% 99% 100% 98%  Weight:  110.9 kg    Height:        Physical Examination: General exam: AAOX3, appears chronically sick, older than her age HEENT:Oral mucosa moist, Ear/Nose WNL grossly Respiratory system: B/L clear BS, wheezing, use of accessory muscle Cardiovascular system: S1 & S2 +, No JVD. Gastrointestinal system: Abdomen soft,NT,ND, BS+ Nervous System:  Bit sleepy but able to wake up and interact, moving extremities Extremities: extremities warm, leg edema trace Skin: No rashes,no icterus. MSK: Normal muscle bulk,tone, power   Medications reviewed:  Scheduled Meds:  alum & mag hydroxide-simeth  15 mL Oral Once   budesonide  (PULMICORT ) nebulizer solution  0.5 mg Nebulization BID   busPIRone   15 mg Oral TID   Chlorhexidine  Gluconate Cloth  6 each Topical Q0600   hydrocortisone   20 mg Oral Daily   And   hydrocortisone   10 mg Oral Daily   levothyroxine   50 mcg Oral QAC breakfast   linaclotide   145 mcg Oral QAC breakfast   midodrine   5 mg Oral BID WC   pantoprazole   40 mg Oral BID   polyethylene glycol  17 g Oral BID   revefenacin   175 mcg Nebulization Daily   rosuvastatin   20 mg Oral Daily   senna-docusate  2 tablet Oral QHS   topiramate   50 mg Oral Q lunch   Continuous Infusions:  heparin  1,650 Units/hr (05/17/24 0931)   Diet: Diet Order             Diet Heart Fluid consistency: Thin  Diet effective now  Data Reviewed: I have personally reviewed following labs and imaging studies ( see epic result tab) CBC: Recent Labs  Lab 05/11/24 1601 05/12/24 0416 05/13/24 1847 05/15/24 0753 05/16/24 0303 05/17/24 0223  WBC 9.2 8.4 11.9* 11.4* 13.6* 12.1*  NEUTROABS 6.8  --  8.8*  --   --   --   HGB 8.6* 9.0* 8.7* 9.5* 10.4* 9.7*  HCT 29.4* 30.6* 30.0* 32.7* 34.8* 31.6*  MCV 92.2 90.5 92.9 90.6 88.5 87.3  PLT 456* 459* 494* 485* 500* 427*   CMP: Recent Labs  Lab 05/11/24 1800 05/12/24 0416 05/14/24 0410 05/15/24 0244 05/15/24 0753 05/16/24 0303 05/17/24 0223  NA  --    < > 140 144 143 142 138  K  --    < > 4.3 3.8 3.8 3.4* 3.6  CL  --    < > 98 96* 99 94* 95*  CO2  --    < > 31 33* 32 31 32  GLUCOSE  --    < > 272* 116* 109* 121* 146*  BUN  --    < > 20 18 19 21 19   CREATININE  --    < > 0.86 0.69 0.81 0.93 1.08*  CALCIUM   --    < > 8.5* 9.0 9.0 9.1 9.0  MG 2.2  --  2.3 2.4 2.3  --   --    <  > = values in this interval not displayed.   GFR: Estimated Creatinine Clearance: 73.8 mL/min (A) (by C-G formula based on SCr of 1.08 mg/dL (H)). Recent Labs  Lab 05/11/24 1601  AST 22  ALT 19  ALKPHOS 64  BILITOT 0.5  PROT 5.9*  ALBUMIN 2.8*    Recent Labs  Lab 05/11/24 1601  LIPASE 26   No results for input(s): AMMONIA  in the last 168 hours. Coagulation Profile: No results for input(s): INR, PROTIME in the last 168 hours. Unresulted Labs (From admission, onward)     Start     Ordered   05/17/24 1500  Heparin  level (unfractionated)  Once-Timed,   TIMED       Question:  Specimen collection method  Answer:  Lab=Lab collect   05/17/24 0844   05/17/24 0500  Heparin  level (unfractionated)  Daily,   R     Question:  Specimen collection method  Answer:  Lab=Lab collect   05/16/24 1110   05/16/24 0500  CBC  Daily,   R     Question:  Specimen collection method  Answer:  Lab=Lab collect   05/15/24 0719   05/16/24 0500  Basic metabolic panel with GFR  Daily,   R     Question:  Specimen collection method  Answer:  Lab=Lab collect   05/15/24 0719           Antimicrobials/Microbiology: Anti-infectives (From admission, onward)    None         Component Value Date/Time   SDES  04/23/2024 1229    URINE, CLEAN CATCH Performed at [facility], [address] [place], [place] [address]    [facility]  04/23/2024 1229    NONE Performed at [facility], [address], [place], [place] [address]    CULT 80,000 COLONIES/mL PROVIDENCIA STUARTII (A) 04/23/2024 1229   REPTSTATUS 04/25/2024 FINAL 04/23/2024 1229    Procedures: Procedure(s) (LRB): RIGHT HEART CATH (N/A)   Mennie LAMY, MD [facility] 05/17/2024, 10:30 AM

## 2024-05-17 NOTE — H&P (View-Only) (Signed)
 Progress Note  Patient Name: Kathleen [name] Date of Encounter: 05/17/2024 [facility] Cardiologist: None (need to CHF - Zenaida)  Interval Summary   Continues diuresis, net 1 L last 24 hours and 24 L since admission.  Weight is down almost 30 pounds since admission. Had 1 more episode of very brief paroxysmal atrial fibrillation lasting less than a minute yesterday.  Unaware. Continues to have a rather low diastolic blood pressure, but asymptomatic at rest, on midodrine . Slight increase in creatinine to 1.08.  Normal electrolytes.  Vital Signs Vitals:   05/17/24 0002 05/17/24 0525 05/17/24 0821 05/17/24 0831  BP: (!) 121/59 (!) 123/52 (!) 117/54   Pulse: 83 74    Resp: 20  (!) 21   Temp: 98.1 F (36.7 C) 98 F (36.7 C)    TempSrc: Oral Oral    SpO2: 97% 99% 100% 98%  Weight:  110.9 kg    Height:        Intake/Output Summary (Last 24 hours) at 05/17/2024 0917 Last data filed at 05/17/2024 0529 Gross per 24 hour  Intake 757.91 ml  Output 1750 ml  Net -992.09 ml      05/17/2024    5:25 AM 05/16/2024    3:51 AM 05/15/2024    4:56 AM  Last 3 Weights  Weight (lbs) 244 lb 7.8 oz 243 lb 13.3 oz 255 lb 11.7 oz  Weight (kg) 110.9 kg 110.6 kg 116 kg      Telemetry/ECG  Mostly sinus rhythm with frequent PACs, very brief paroxysmal A-fib about 3 times yesterday- Personally Reviewed  Physical Exam  GEN: No acute distress.   Neck: No JVD Cardiac: RRR, no murmurs, rubs, or gallops.  Respiratory: Rather coarse breath sounds, but otherwise clear to auscultation bilaterally. GI: Soft, nontender, non-distended  MS: No edema  Assessment & Plan  63 year old woman with a history of HFpEF, chronic respiratory failure on home oxygen , pulmonary sarcoidosis on prednisone  therapy complicated by avascular necrosis of hips and compression fractures, now essentially bedbound, history of paroxysmal atrial fibrillation, HTN, adrenal insufficiency, OSA admitted 05/11/2024 with acute heart  failure exacerbation, responding well to intravenous diuretic therapy.  Had several brief episodes of paroxysmal atrial fibrillation yesterday.  Very rapid rates, but very short duration.  Started intravenous heparin  for the time being in anticipation of right heart catheterization tomorrow.  May not be a great candidate for long-term anticoagulation due to her history of severe orthostatic hypotension and risk of falls.  Will need to have that decision made prior to hospital discharge.  Seem to have reached the limits of intravenous diuretics for left heart failure.  Stop IV diuretics and can further tailor her dose of oral diuretics based on the findings of right heart catheterization tomorrow.  Potassium level now normal  She wants to go ahead with the catheterization and wants to have us  much information about her heart issues as possible.  She is not currently on the schedule for catheterization.   Informed Consent   Shared Decision Making/Informed Consent The risks, including but not limited to, [bleeding or vascular complications (1 in 500), pneumothorax (1 in 1600), arrhythmia (1 in 1000) and death (1 in 5000)], benefits (diagnostic support and/or management of heart failure, pulmonary hypertension) and alternatives of a right heart catheterization were discussed in detail with [name] and she is willing to proceed.       For questions or updates, please contact [facility] Please consult [email or web address] for contact info under  Signed, Jerel Balding, MD

## 2024-05-17 NOTE — Progress Notes (Signed)
 ANTICOAGULATION CONSULT NOTE  Pharmacy Consult for heparin  Indication: atrial fibrillation  Allergies  Allergen Reactions   Bee Venom Anaphylaxis   Contrast Media [Iodinated Contrast Media] Other (See Comments)    CT chest w/ contrast at OSH followed by SOB resolved w/ single dose steroids, never ENT swelling, intubation or pressors, has had multiple contrasted scans since   Bactrim  [Sulfamethoxazole -Trimethoprim ] Nausea And Vomiting   Codeine Itching    Patient Measurements: Height: 5' 10 (177.8 cm) Weight: 110.9 kg (244 lb 7.8 oz) IBW/kg (Calculated) : 68.5 Heparin  Dosing Weight: 95.6 kg  Vital Signs: Temp: 98.7 F (37.1 C) (09/21 1527) Temp Source: Oral (09/21 1527) BP: 123/66 (09/21 1527) Pulse Rate: 81 (09/21 1527)  Labs: Recent Labs    05/15/24 0753 05/16/24 0303 05/16/24 1831 05/17/24 0223 05/17/24 0744 05/17/24 1423  HGB 9.5* 10.4*  --  9.7*  --   --   HCT 32.7* 34.8*  --  31.6*  --   --   PLT 485* 500*  --  427*  --   --   HEPARINUNFRC  --   --    < > 0.28* 0.12* 0.20*  CREATININE 0.81 0.93  --  1.08*  --   --    < > = values in this interval not displayed.    Estimated Creatinine Clearance: 73.8 mL/min (A) (by C-G formula based on SCr of 1.08 mg/dL (H)).  Medical History: Past Medical History:  Diagnosis Date   Adrenal insufficiency (HCC)    Anemia    Anxiety    Avascular bone necrosis (HCC)    Breast fibrocystic disorder    Chronic pain    Encounter for long-term opiate analgesic use 02/02/2020   Patient has chronic pain with femoral head avascular necrosis.  Is under pain contract   Fibromyalgia    Gastroesophageal reflux disease    Hypothyroidism    Mitral valve prolapse    Orthostatic hypotension    Peripheral neuropathy    PONV (postoperative nausea and vomiting)    Restrictive lung disease    Moderate to severe   Sarcoidosis    Biopsy proven - [facility]   Sarcoidosis    SOB (shortness of breath)    chronic    Medications:  See  MAR  Assessment: 63 yo female presents with acute on chronic HFpEF now with paroxysmal AF RVR episode this morning 9/20 .  Not on anticoagulation prior to admission.  Pharmacy consulted for heparin  dosing.  Per chart review, only PMH of atrial fibrillation was on presentation to [facility] in 2017.  Patient was started on amiodarone  and heparin  infusion and transferred to [facility] for further workup but self converted to SR upon transfer at which time heparin  and amiodarone  were discontinued and not restarted.  No further mention of Afib since.   9/21 PM Update:  Heparin  level = 0.20, remains subtherapeutic, but did increase from 0.12 this morning after Heparin  drip rate increased to 1650 units/hr.  Hgb 9.7, pltc 427 - mostly stable, slight Hgb drop. No line issues or infusion pauses in heparin  infusion and no s/sx of  bleeding reported, confirmed with RN.   Goal of Therapy:  Heparin  level 0.3-0.7 units/ml Monitor platelets by anticoagulation protocol: Yes   Plan:  Give heparin  1400 units IV bolus x1 and Increase Heparin  infusion to 1850 units/hr  Check 6h anti-Xa level Daily CBC, anti-Xa level Monitor for s/sx of bleeding   Levorn Gaskins, RPh Clinical Pharmacist  05/17/2024  3:36 PM

## 2024-05-17 NOTE — Progress Notes (Signed)
 Progress Note  Patient Name: Kathleen [name] Date of Encounter: 05/17/2024 [facility] Cardiologist: None (need to CHF - Zenaida)  Interval Summary   Continues diuresis, net 1 L last 24 hours and 24 L since admission.  Weight is down almost 30 pounds since admission. Had 1 more episode of very brief paroxysmal atrial fibrillation lasting less than a minute yesterday.  Unaware. Continues to have a rather low diastolic blood pressure, but asymptomatic at rest, on midodrine . Slight increase in creatinine to 1.08.  Normal electrolytes.  Vital Signs Vitals:   05/17/24 0002 05/17/24 0525 05/17/24 0821 05/17/24 0831  BP: (!) 121/59 (!) 123/52 (!) 117/54   Pulse: 83 74    Resp: 20  (!) 21   Temp: 98.1 F (36.7 C) 98 F (36.7 C)    TempSrc: Oral Oral    SpO2: 97% 99% 100% 98%  Weight:  110.9 kg    Height:        Intake/Output Summary (Last 24 hours) at 05/17/2024 0917 Last data filed at 05/17/2024 0529 Gross per 24 hour  Intake 757.91 ml  Output 1750 ml  Net -992.09 ml      05/17/2024    5:25 AM 05/16/2024    3:51 AM 05/15/2024    4:56 AM  Last 3 Weights  Weight (lbs) 244 lb 7.8 oz 243 lb 13.3 oz 255 lb 11.7 oz  Weight (kg) 110.9 kg 110.6 kg 116 kg      Telemetry/ECG  Mostly sinus rhythm with frequent PACs, very brief paroxysmal A-fib about 3 times yesterday- Personally Reviewed  Physical Exam  GEN: No acute distress.   Neck: No JVD Cardiac: RRR, no murmurs, rubs, or gallops.  Respiratory: Rather coarse breath sounds, but otherwise clear to auscultation bilaterally. GI: Soft, nontender, non-distended  MS: No edema  Assessment & Plan  63 year old woman with a history of HFpEF, chronic respiratory failure on home oxygen , pulmonary sarcoidosis on prednisone  therapy complicated by avascular necrosis of hips and compression fractures, now essentially bedbound, history of paroxysmal atrial fibrillation, HTN, adrenal insufficiency, OSA admitted 05/11/2024 with acute heart  failure exacerbation, responding well to intravenous diuretic therapy.  Had several brief episodes of paroxysmal atrial fibrillation yesterday.  Very rapid rates, but very short duration.  Started intravenous heparin  for the time being in anticipation of right heart catheterization tomorrow.  May not be a great candidate for long-term anticoagulation due to her history of severe orthostatic hypotension and risk of falls.  Will need to have that decision made prior to hospital discharge.  Seem to have reached the limits of intravenous diuretics for left heart failure.  Stop IV diuretics and can further tailor her dose of oral diuretics based on the findings of right heart catheterization tomorrow.  Potassium level now normal  She wants to go ahead with the catheterization and wants to have us  much information about her heart issues as possible.  She is not currently on the schedule for catheterization.   Informed Consent   Shared Decision Making/Informed Consent The risks, including but not limited to, [bleeding or vascular complications (1 in 500), pneumothorax (1 in 1600), arrhythmia (1 in 1000) and death (1 in 5000)], benefits (diagnostic support and/or management of heart failure, pulmonary hypertension) and alternatives of a right heart catheterization were discussed in detail with [name] and she is willing to proceed.       For questions or updates, please contact [facility] Please consult [email or web address] for contact info under  Signed, Jerel Balding, MD

## 2024-05-18 ENCOUNTER — Encounter (HOSPITAL_COMMUNITY)
Admission: EM | Disposition: A | Discharge status: Home Health | Payer: Self-pay | Source: Home / Self Care | Attending: Internal Medicine

## 2024-05-18 DIAGNOSIS — I5033 Acute on chronic diastolic (congestive) heart failure: Secondary | ICD-10-CM | POA: Diagnosis not present

## 2024-05-18 DIAGNOSIS — R601 Generalized edema: Secondary | ICD-10-CM | POA: Diagnosis not present

## 2024-05-18 DIAGNOSIS — I272 Pulmonary hypertension, unspecified: Secondary | ICD-10-CM | POA: Diagnosis not present

## 2024-05-18 DIAGNOSIS — Z515 Encounter for palliative care: Secondary | ICD-10-CM | POA: Diagnosis not present

## 2024-05-18 DIAGNOSIS — R339 Retention of urine, unspecified: Secondary | ICD-10-CM

## 2024-05-18 DIAGNOSIS — I509 Heart failure, unspecified: Secondary | ICD-10-CM | POA: Diagnosis not present

## 2024-05-18 DIAGNOSIS — Z789 Other specified health status: Secondary | ICD-10-CM

## 2024-05-18 DIAGNOSIS — R0609 Other forms of dyspnea: Secondary | ICD-10-CM

## 2024-05-18 HISTORY — PX: RIGHT HEART CATH: CATH118263

## 2024-05-18 LAB — POCT I-STAT EG7
Acid-Base Excess: 8 mmol/L — ABNORMAL HIGH (ref 0.0–2.0)
Acid-Base Excess: 8 mmol/L — ABNORMAL HIGH (ref 0.0–2.0)
Bicarbonate: 34.9 mmol/L — ABNORMAL HIGH (ref 20.0–28.0)
Bicarbonate: 35.3 mmol/L — ABNORMAL HIGH (ref 20.0–28.0)
Calcium, Ion: 1.26 mmol/L (ref 1.15–1.40)
Calcium, Ion: 1.27 mmol/L (ref 1.15–1.40)
HCT: 29 % — ABNORMAL LOW (ref 36.0–46.0)
HCT: 29 % — ABNORMAL LOW (ref 36.0–46.0)
Hemoglobin: 9.9 g/dL — ABNORMAL LOW (ref 12.0–15.0)
Hemoglobin: 9.9 g/dL — ABNORMAL LOW (ref 12.0–15.0)
O2 Saturation: 47 %
O2 Saturation: 53 %
Potassium: 4.5 mmol/L (ref 3.5–5.1)
Potassium: 4.6 mmol/L (ref 3.5–5.1)
Sodium: 135 mmol/L (ref 135–145)
Sodium: 136 mmol/L (ref 135–145)
TCO2: 37 mmol/L — ABNORMAL HIGH (ref 22–32)
TCO2: 37 mmol/L — ABNORMAL HIGH (ref 22–32)
pCO2, Ven: 61.9 mmHg — ABNORMAL HIGH (ref 44–60)
pCO2, Ven: 62.4 mmHg — ABNORMAL HIGH (ref 44–60)
pH, Ven: 7.359 (ref 7.25–7.43)
pH, Ven: 7.361 (ref 7.25–7.43)
pO2, Ven: 28 mmHg — CL (ref 32–45)
pO2, Ven: 30 mmHg — CL (ref 32–45)

## 2024-05-18 LAB — BASIC METABOLIC PANEL WITH GFR
Anion gap: 9 (ref 5–15)
BUN: 12 mg/dL (ref 8–23)
CO2: 30 mmol/L (ref 22–32)
Calcium: 8.9 mg/dL (ref 8.9–10.3)
Chloride: 96 mmol/L — ABNORMAL LOW (ref 98–111)
Creatinine, Ser: 1.03 mg/dL — ABNORMAL HIGH (ref 0.44–1.00)
GFR, Estimated: >60 mL/min (ref >60)
Glucose, Bld: 144 mg/dL — ABNORMAL HIGH (ref 70–99)
Potassium: 3.8 mmol/L (ref 3.5–5.1)
Sodium: 135 mmol/L (ref 135–145)

## 2024-05-18 LAB — HEPARIN LEVEL (UNFRACTIONATED): Heparin Unfractionated: 0.36 [IU]/mL (ref 0.30–0.70)

## 2024-05-18 LAB — GLUCOSE, CAPILLARY: Glucose-Capillary: 130 mg/dL — ABNORMAL HIGH (ref 70–99)

## 2024-05-18 LAB — CBC
HCT: 29.9 % — ABNORMAL LOW (ref 36.0–46.0)
Hemoglobin: 9.1 g/dL — ABNORMAL LOW (ref 12.0–15.0)
MCH: 26.8 pg (ref 26.0–34.0)
MCHC: 30.4 g/dL (ref 30.0–36.0)
MCV: 87.9 fL (ref 80.0–100.0)
Platelets: 385 K/uL (ref 150–400)
RBC: 3.4 MIL/uL — ABNORMAL LOW (ref 3.87–5.11)
RDW: 17 % — ABNORMAL HIGH (ref 11.5–15.5)
WBC: 10.8 K/uL — ABNORMAL HIGH (ref 4.0–10.5)
nRBC: 0 % (ref 0.0–0.2)

## 2024-05-18 SURGERY — RIGHT HEART CATH
Anesthesia: LOCAL

## 2024-05-18 MED ORDER — SODIUM CHLORIDE 0.9 % IV SOLN: 250.0000 mL | INTRAVENOUS | Status: AC | PRN

## 2024-05-18 MED ORDER — HEPARIN (PORCINE) IN NACL 1000-0.9 UT/500ML-% IV SOLN
INTRAVENOUS | Status: DC | PRN
  Administered 2024-05-18: 500 mL

## 2024-05-18 MED ORDER — HYDRALAZINE HCL 20 MG/ML IJ SOLN: 10.0000 mg | INTRAMUSCULAR | Status: AC | PRN

## 2024-05-18 MED ORDER — LIDOCAINE HCL (PF) 1 % IJ SOLN
INTRAMUSCULAR | Status: DC | PRN
  Administered 2024-05-18: 2 mL

## 2024-05-18 MED ORDER — LABETALOL HCL 5 MG/ML IV SOLN: 10.0000 mg | INTRAVENOUS | Status: AC | PRN

## 2024-05-18 MED ORDER — ONDANSETRON HCL 4 MG/2ML IJ SOLN
4.0000 mg | Freq: Four times a day (QID) | INTRAMUSCULAR | Status: DC | PRN
  Filled 2024-05-18 (×2): qty 2

## 2024-05-18 MED ORDER — MIDAZOLAM HCL 2 MG/2ML IJ SOLN
INTRAMUSCULAR | Status: DC | PRN
  Administered 2024-05-18: 1 mg via INTRAVENOUS

## 2024-05-18 MED ORDER — MIDAZOLAM HCL 2 MG/2ML IJ SOLN
INTRAMUSCULAR | Status: AC
  Filled 2024-05-18: qty 2

## 2024-05-18 MED ORDER — SODIUM CHLORIDE 0.9% FLUSH
3.0000 mL | Freq: Two times a day (BID) | INTRAVENOUS | Status: DC
  Administered 2024-05-18 – 2024-05-23 (×10): 3 mL via INTRAVENOUS

## 2024-05-18 MED ORDER — LIDOCAINE HCL (PF) 1 % IJ SOLN
INTRAMUSCULAR | Status: AC
  Filled 2024-05-18: qty 30

## 2024-05-18 MED ORDER — SODIUM CHLORIDE 0.9% FLUSH: 3.0000 mL | INTRAVENOUS | Status: DC | PRN

## 2024-05-18 MED ORDER — FREE WATER
250.0000 mL | Freq: Once | Status: AC
  Administered 2024-05-18: 250 mL via ORAL

## 2024-05-18 MED ORDER — ACETAMINOPHEN 325 MG PO TABS
650.0000 mg | ORAL_TABLET | ORAL | Status: DC | PRN
  Administered 2024-05-22 – 2024-05-23 (×3): 650 mg via ORAL
  Filled 2024-05-18 (×3): qty 2

## 2024-05-18 MED ORDER — FREE WATER
100.0000 mL | Freq: Once | Status: AC
  Administered 2024-05-18: 100 mL via ORAL

## 2024-05-18 SURGICAL SUPPLY — 7 items
CATH BALLN WEDGE 5F 110CM (CATHETERS) IMPLANT
GUIDEWIRE .025 260CM (WIRE) IMPLANT
MAT PREVALON FULL STRYKER (MISCELLANEOUS) IMPLANT
PACK CARDIAC CATHETERIZATION (CUSTOM PROCEDURE TRAY) ×1 IMPLANT
SHEATH GLIDE SLENDER 4/5FR (SHEATH) IMPLANT
TRANSDUCER W/STOPCOCK (MISCELLANEOUS) IMPLANT
TUBING ART PRESS 72 MALE/FEM (TUBING) IMPLANT

## 2024-05-18 NOTE — Interval H&P Note (Signed)
 History and Physical Interval Note:  05/18/2024 2:27 PM  Kathleen [name]  has presented today for surgery, with the diagnosis of HF.  The various methods of treatment have been discussed with the patient and family. After consideration of risks, benefits and other options for treatment, the patient has consented to  Procedure(s): RIGHT HEART CATH (N/A) as a surgical intervention.  The patient's history has been reviewed, patient examined, no change in status, stable for surgery.  I have reviewed the patient's chart and labs.  Questions were answered to the patient's satisfaction.     Mavryk Pino [employer]

## 2024-05-18 NOTE — Progress Notes (Signed)
 Daily Progress Note   Patient Name: Kathleen [name]       Date: 05/18/2024 DOB: 02-15-1961  Age: 63 y.o. MRN#: [id number] Attending Physician: Christobal Guadalajara, MD Primary Care Physician: Alphonsa Glendia LABOR, MD Admit Date: 05/11/2024  Reason for Consultation/Follow-up: Establishing goals of care  Subjective: I have reviewed medical records including EPIC notes, MAR, any available advanced directives which are none, and labs. Received report from primary RN - no acute concerns. RN reports patient is scheduled for heart catheterization this afternoon.  Went to visit patient this afternoon - no family/visitors present. Patient was lying in bed awake, alert, oriented, and able to participate in conversation. No signs or non-verbal gestures of pain or discomfort noted. No respiratory distress, increased work of breathing, or secretions noted.   Emotional support provided to patient. She remembers discussions with my colleague Tobey On Friday. She also confirms goals at this time are to pursue heart cath this afternoon to gain additional insight into her condition. She is open to PMT follow up tomorrow for ongoing GOC.  All questions and concerns addressed.   Length of Stay: 7  Current Medications: Scheduled Meds:   alum & mag hydroxide-simeth  15 mL Oral Once   budesonide  (PULMICORT ) nebulizer solution  0.5 mg Nebulization BID   busPIRone   15 mg Oral TID   Chlorhexidine  Gluconate Cloth  6 each Topical Q0600   hydrocortisone   20 mg Oral Daily   And   hydrocortisone   10 mg Oral Daily   levothyroxine   50 mcg Oral QAC breakfast   linaclotide   145 mcg Oral QAC breakfast   midodrine   5 mg Oral BID WC   pantoprazole   40 mg Oral BID   polyethylene glycol  17 g Oral BID   revefenacin   175 mcg Nebulization  Daily   rosuvastatin   20 mg Oral Daily   senna-docusate  2 tablet Oral QHS   topiramate   50 mg Oral Q lunch    Continuous Infusions:  sodium chloride  10 mL/hr at 05/17/24 2214   heparin  1,850 Units/hr (05/18/24 1051)    PRN Meds: acetaminophen  **OR** acetaminophen , albuterol , alum & mag hydroxide-simeth, hydrOXYzine , Muscle Rub, nitroGLYCERIN , ondansetron  **OR** ondansetron  (ZOFRAN ) IV, oxyCODONE   Physical Exam Vitals and nursing note reviewed.  Constitutional:      General: She is not in acute distress.  Appearance: She is obese.  Pulmonary:     Effort: No respiratory distress.  Skin:    General: Skin is warm and dry.  Neurological:     Mental Status: She is alert and oriented to person, place, and time.     Motor: Weakness present.  Psychiatric:        Attention and Perception: Attention normal.        Behavior: Behavior is cooperative.        Cognition and Memory: Cognition and memory normal.             Vital Signs: BP 108/60 (BP Location: Right Arm)   Pulse 82   Temp 98.5 F (36.9 C) (Oral)   Resp 18   Ht 5' 10 (1.778 m)   Wt 111 kg   LMP 05/17/2016   SpO2 96%   BMI 35.11 kg/m  SpO2: SpO2: 96 % O2 Device: O2 Device: Nasal Cannula O2 Flow Rate: O2 Flow Rate (L/min): 5 L/min (Chronic per pt)  Intake/output summary:  Intake/Output Summary (Last 24 hours) at 05/18/2024 1253 Last data filed at 05/18/2024 1100 Gross per 24 hour  Intake 899.59 ml  Output 1350 ml  Net -450.41 ml   LBM: Last BM Date : 05/17/24 Baseline Weight: Weight: 123.4 kg Most recent weight: Weight: 111 kg       Palliative Assessment/Data: PPS 30%      Patient Active Problem List   Diagnosis Date Noted   Acute on chronic heart failure with preserved ejection fraction (HFpEF) (HCC) 05/11/2024   UTI (urinary tract infection) 04/23/2024   Osteoporosis with current pathological fracture 03/17/2024   Anxiety 02/11/2024   Spinal compression fracture (HCC) 01/29/2024   Lower back  pain 01/26/2024   Closed compression fracture of L3 lumbar vertebra, initial encounter (HCC) 01/21/2024   Intractable low back pain 01/03/2024   Ambulatory dysfunction 12/11/2023   Uncontrolled pain 12/08/2023   Pressure injury of skin 12/08/2023   Intractable back pain 12/07/2023   Prediabetes 10/28/2023   Ankle fracture, bimalleolar, closed, left, sequela 03/10/2023   Closed fracture of left ankle 03/10/2023   Syndesmotic disruption of ankle, left, initial encounter 03/10/2023   Lumbar transverse process fracture (HCC) 01/02/2023   Acute on chronic hypoxic respiratory failure (HCC) 12/13/2022   Depression, major, single episode, moderate (HCC) 09/10/2022   Aortic atherosclerosis (HCC) 09/10/2022   Chronic respiratory failure with hypoxia (HCC) 05/22/2021   Anxiety and depression 05/22/2021   Vitamin D  deficiency 05/09/2021   Left foot drop 01/09/2021   Pulmonary HTN (HCC) 10/12/2020   Encounter for long-term opiate analgesic use 02/02/2020   Interstitial lung disease (HCC) 04/14/2019   GERD (gastroesophageal reflux disease) 10/15/2017   Personal history of noncompliance with medical treatment, presenting hazards to health 04/26/2017   Hypothyroidism 01/15/2017   Adrenal insufficiency (Addison's disease) (HCC) 08/22/2016   Symptomatic bradycardia    Adrenal insufficiency (HCC) 08/17/2016   Hypokalemia 08/17/2016   Chronic pain syndrome 04/20/2016   Irritable bowel syndrome with constipation 11/14/2015   Avascular necrosis of bone of hip, left (HCC) 04/05/2015   Post herpetic neuralgia 10/22/2014   Mixed hyperlipidemia 08/25/2014   Obstructive sleep apnea 02/18/2014   Fibromyalgia 02/11/2013   Bilateral lower extremity edema 01/22/2013   Hypotension 01/16/2013   Fibrocystic breast disease in female 04/09/2012   Sarcoidosis 06/21/2008    Palliative Care Assessment & Plan   Patient Profile: 63 y.o. female  with past medical history of heart failure, paroxysmal atrial  fibrillation, pulm sarcoidosis, pulmonary  hypertension, OSA, chronic respiratory failure on oxygen , adrenal insufficiency, chronic pain presented secondary to shortness of breath and swelling, found to have evidence of acute on chronic heart failure  admitted on 05/11/2024 with acute on chronic heart failure.  Previously on prednisone  but had AVN of hips and multiple compression fracture. Of note  left femoral head avascular necrosis was evaluated by ortho and not a candidate for surgery. Since that time she has been severely limited. She is bedbound except for [facility]. Her husband provides 24 hour care.    She has had multiple recent hospitalizations. PMT consulted to discuss GOC.   Assessment: Principal Problem:   Acute on chronic heart failure with preserved ejection fraction (HFpEF) (HCC) Active Problems:   Bilateral lower extremity edema   Obstructive sleep apnea   Chronic pain syndrome   Hypothyroidism   Interstitial lung disease (HCC)   Chronic respiratory failure with hypoxia (HCC)   Anxiety and depression   Adrenal insufficiency (Addison's disease) (HCC)   Concern about end of life  Recommendations/Plan: Continue current plan of care - proceed with heart cath this afternoon Continue full code as previously documented Patient open for PMT follow up tomorrow for ongoing GOC  PMT will continue to follow and support holistically  Goals of Care and Additional Recommendations: Limitations on Scope of Treatment: Full Scope Treatment  Code Status:    Code Status Orders  (From admission, onward)           Start     Ordered   05/11/24 1930  Full code  Continuous       Question:  By:  Answer:  Consent: discussion documented in EHR   05/11/24 1932           Code Status History     Date Active Date Inactive Code Status Order ID Comments User Context   04/23/2024 1539 05/01/2024 1955 Full Code [id number]  Sherlon Brayton RAMAN, MD ED   02/19/2024 1338 02/25/2024 1456 Full Code  [id number]  Urbano Albright, MD Inpatient   02/06/2024 1441 02/19/2024 1338 Limited: Do not attempt resuscitation (DNR) -DNR-LIMITED -Do Not Intubate/DNI  [id number]  Pegge Toribio PARAS, PA-C Inpatient   01/29/2024 1010 02/06/2024 1429 Limited: Do not attempt resuscitation (DNR) -DNR-LIMITED -Do Not Intubate/DNI  [id number]  Odell Celinda Balo, MD Inpatient   01/26/2024 0943 01/29/2024 1010 Full Code [id number]  Alto Isaiah CROME, NP Inpatient   01/26/2024 0425 01/26/2024 0943 Full Code [id number]  Manfred Driver, DO Inpatient   01/21/2024 0201 01/23/2024 2143 Full Code [id number]  Keturah Carrier, MD ED   01/03/2024 2051 01/08/2024 1946 Full Code [id number]  Elgergawy, Brayton RAMAN, MD ED   12/07/2023 2047 12/11/2023 1800 Full Code [id number]  Pearlean Tully BRAVO, MD ED   04/07/2023 0301 04/09/2023 2117 Full Code [id number]  Charlton Evalene RAMAN, MD ED   03/10/2023 1256 03/14/2023 2115 Full Code [id number]  Evonnie Lenis, MD ED   12/12/2022 2147 12/15/2022 1706 Full Code [id number]  Pearlean Tully BRAVO, MD Inpatient   05/22/2021 0009 05/22/2021 2101 Full Code [id number]  Manfred Driver, DO ED   01/27/2020 0334 01/28/2020 1527 Full Code [id number]  Drusilla Sabas RAMAN, MD ED   11/04/2017 2153 11/06/2017 1614 Full Code [id number]  Drusilla Sabas RAMAN, MD Inpatient   10/21/2017 2047 10/22/2017 1631 Full Code [id number]  Luke Agent, MD Inpatient   10/16/2017 0108 10/18/2017 1710 Full Code [id number]  Celinda Lenis Lot, MD ED   08/17/2016 1917 08/22/2016 0402 Full Code [id number]  Barbra Lang PARAS, DO Inpatient  12/28/2015 1624 01/02/2016 2154 Full Code [id number]  Claudene Maximino LABOR, MD ED   01/16/2013 2209 01/21/2013 1520 Full Code [id number]  Verdene Purchase, MD Inpatient       Prognosis:  Unable to determine  Discharge Planning: To Be Determined  Care plan was discussed with primary RN, patient  Thank you for allowing the Palliative Medicine Team to assist in the care of this patient.   Total Time 35 minutes Prolonged Time Billed  no       Jeoffrey CHRISTELLA Claudene, NP  Please contact Palliative Medicine Team phone at [phone] for questions and concerns.   *Portions of this note are a verbal dictation therefore any spelling and/or grammatical errors are due to the Dragon Medical One system interpretation.

## 2024-05-18 NOTE — Progress Notes (Signed)
 PROGRESS NOTE Kathleen [name]  FMW:[id number] DOB: 1961-02-11 DOA: 05/11/2024 PCP: Alphonsa Glendia LABOR, MD  Brief Narrative/Hospital Course: Kathleen [name] is a 63 y.o. female with PMH of heart failure, paroxysmal atrial fibrillation, sarcoidosis, chronic respiratory failure on oxygen , adrenal insufficiency, chronic pain presented secondary to shortness of breath and swelling, found to have evidence of acute on chronic heart failure, cardiology was consulted, patient was admitted and started on IV diuretics Patient is very ill with complex medical issues, with pulmonary sarcoidosis, previously on steroid and had AVN of the hip and multiple compression fracture, orthopedic felt not a candidate for surgery for AVN and has severely limited mobility, mostly bedbound except for [facility].  She has chronic hypoxic tori failure on supplemental oxygen .. 9/19> transferred from [facility] to [facility] for advanced heart failure team evaluation  Subjective: Seen and examined  Sleeping husband at the bedside no complaints no chest pain nausea vomiting fever chills Awaiting for cardiac cath weight 272 LB>>244LB> 24 lb same  Assessment and plan:  Acute on chronic HFpEF Left small effusion/pulmonary congestion Chronic dyspnea Recent admissions for acute on chronic HFpEF Mild hypokalemia from diuresis: PTA 22 LB weight gain w/ recent echo August EF 60 to 65% indeterminate diastolic function, elevated PASP in past. Managed with diuresis, transferred to [facility] for AHF team eva  Recent pulmonary eval at [facility] requesting RHC to eval for underlying pulmonary hypertension given her sarcoid/ILD. With aggressive diuresis/improved volume status also improved>with blood pressure being soft Lasix  dose decreased 9/20> STOPPED 9/21 Cardiology following > possible RHC.Cont to monitor daily I/O,weight, electrolytes and replaceKeep on  salt/fluid restricted diet and monitor in tele. Net IO Since Admission: -24,457.3 mL [05/18/24 0900]   Filed Weights   05/16/24 0351 05/17/24 0525 05/18/24 0452  Weight: 110.6 kg 110.9 kg 111 kg    Recent Labs  Lab 05/11/24 1601 05/11/24 1800 05/12/24 0416 05/14/24 0410 05/15/24 0244 05/15/24 0753 05/16/24 0303 05/17/24 0223 05/18/24 0324  BNP 53.0  --   --   --   --   --   --   --   --   BUN 5*  --    < > 20 18 19 21 19 12   CREATININE 0.70  --    < > 0.86 0.69 0.81 0.93 1.08* 1.03*  K 3.6  --    < > 4.3 3.8 3.8 3.4* 3.6 3.8  MG  --  2.2  --  2.3 2.4 2.3  --   --   --    < > = values in this interval not displayed.     Pulmonary sarcoidosis/ILD Chronic hypoxic respiratory failure on 5 L [place]: Continue supplemental oxygen .She is followed by [name]  has been stopped, cannot tolerate methotrexate due to diarrhea hair loss, cannot tolerate CellCept due to diarrhea and has been continued on nebs, supportive care, advised pulmonary rehab by [facility] [place] pulmonology.  Last CT chest 5/26 /25> showed similar appearing chronic bilateral upper lobe paramediastinal architectural distortion, bronchiectasis and peribronchial consolidation/scaring  Normocytic anemia: Suspect positive chronic illness hemoglobin stable 9 to 10 g.  Monitor  Lethargy Weakness: Mental status improved AAO x 3.  Minimize opioid/pain meds continue PT OT   Goals of care Bed bound w/ dyspnea Ambulatory dysfunction Frequent hospitalizations-she had 9 hospitalizations since January this year : Overall poor prognosis. Consulted palliative care, encourage to discuss goals of care and CODE STATUS . Patient remains at high risk for decompensation, risk for frequent admissions. She is at full code  but I am very concerned , overall prognosis does not appear bright-informed patient's husband, palliative consulted has been obtained, patient adamant on staying full code with full scope of care for now until pending cardiac cath Family waiting on cardiac catheter to get some answers, and see if problem can be fixed  OSA: Not  using CPAP.  Chronic pain syndrome Anxiety/depression Pain controlled mood stable continue home meds including -BuSpar , Topamax , oxycodone   Adrenal insufficiency Hypothyroidism Chronic hypotension: Continue her synthroid , midodrine  and Cortef  bid.  Received IV Solu-Medrol  this admission  HLD Continue statin  Constipation: continue Linzess , MiraLAX .  Last bowel movement 9/20  Class I Obesity w/ Body mass index is 35.11 kg/m.: Will benefit with PCP follow-up, weight loss,healthy lifestyle  Mobility: PT Orders: Active PT Follow up Rec: Home Health Pt9/19/2025 1519   DVT prophylaxis:  Code Status:   Code Status: Full Code Family Communication: plan of care discussed with patient/husband at bedside has been updated. Patient status is: Remains hospitalized because of severity of illness Level of care: Telemetry Cardiac   Dispo: The patient is from: home            Anticipated disposition: TBD Objective: Vitals last 24 hrs: Vitals:   05/18/24 0444 05/18/24 0452 05/18/24 0807 05/18/24 0844  BP:    (!) 116/57  Pulse:    83  Resp: 15 14  18   Temp:    98.7 F (37.1 C)  TempSrc:    Oral  SpO2:   99% 97%  Weight:  111 kg    Height:        Physical Examination: General exam: AAOX3, chronically ill-appearing. ON 5L [place] HEENT:Oral mucosa moist, Ear/Nose WNL grossly Respiratory system: B/L clear BS, wheezing, use of accessory muscle Cardiovascular system: S1 & S2 +, No JVD. Gastrointestinal system: Abdomen soft,NT,ND, BS+ Nervous System: Bit sleepy but able to wake up and interact, moving extremities Extremities: extremities warm, leg edema  minimal Skin: No rashes,no icterus. MSK: Normal muscle bulk,tone, power   Medications reviewed:  Scheduled Meds:  alum & mag hydroxide-simeth  15 mL Oral Once   budesonide  (PULMICORT ) nebulizer solution  0.5 mg Nebulization BID   busPIRone   15 mg Oral TID   Chlorhexidine  Gluconate Cloth  6 each Topical Q0600   hydrocortisone   20 mg  Oral Daily   And   hydrocortisone   10 mg Oral Daily   levothyroxine   50 mcg Oral QAC breakfast   linaclotide   145 mcg Oral QAC breakfast   midodrine   5 mg Oral BID WC   pantoprazole   40 mg Oral BID   polyethylene glycol  17 g Oral BID   revefenacin   175 mcg Nebulization Daily   rosuvastatin   20 mg Oral Daily   senna-docusate  2 tablet Oral QHS   topiramate   50 mg Oral Q lunch   Continuous Infusions:  sodium chloride  10 mL/hr at 05/17/24 2214   heparin  1,850 Units/hr (05/17/24 1920)   Diet: Diet Order             Diet NPO time specified Except for: Sips with Meds  Diet effective 0500                   Data Reviewed: I have personally reviewed following labs and imaging studies ( see epic result tab) CBC: Recent Labs  Lab 05/11/24 1601 05/12/24 0416 05/13/24 1847 05/15/24 0753 05/16/24 0303 05/17/24 0223 05/18/24 0324  WBC 9.2   < > 11.9* 11.4* 13.6* 12.1* 10.8*  NEUTROABS 6.8  --  8.8*  --   --   --   --   HGB 8.6*   < > 8.7* 9.5* 10.4* 9.7* 9.1*  HCT 29.4*   < > 30.0* 32.7* 34.8* 31.6* 29.9*  MCV 92.2   < > 92.9 90.6 88.5 87.3 87.9  PLT 456*   < > 494* 485* 500* 427* 385   < > = values in this interval not displayed.   CMP: Recent Labs  Lab 05/11/24 1800 05/12/24 0416 05/14/24 0410 05/15/24 0244 05/15/24 0753 05/16/24 0303 05/17/24 0223 05/18/24 0324  NA  --    < > 140 144 143 142 138 135  K  --    < > 4.3 3.8 3.8 3.4* 3.6 3.8  CL  --    < > 98 96* 99 94* 95* 96*  CO2  --    < > 31 33* 32 31 32 30  GLUCOSE  --    < > 272* 116* 109* 121* 146* 144*  BUN  --    < > 20 18 19 21 19 12   CREATININE  --    < > 0.86 0.69 0.81 0.93 1.08* 1.03*  CALCIUM   --    < > 8.5* 9.0 9.0 9.1 9.0 8.9  MG 2.2  --  2.3 2.4 2.3  --   --   --    < > = values in this interval not displayed.   GFR: Estimated Creatinine Clearance: 77.4 mL/min (A) (by C-G formula based on SCr of 1.03 mg/dL (H)). Recent Labs  Lab 05/11/24 1601  AST 22  ALT 19  ALKPHOS 64  BILITOT 0.5  PROT  5.9*  ALBUMIN 2.8*    Recent Labs  Lab 05/11/24 1601  LIPASE 26   No results for input(s): AMMONIA  in the last 168 hours. Coagulation Profile: No results for input(s): INR, PROTIME in the last 168 hours. Unresulted Labs (From admission, onward)     Start     Ordered   05/17/24 0500  Heparin  level (unfractionated)  Daily,   R     Question:  Specimen collection method  Answer:  Lab=Lab collect   05/16/24 1110   05/16/24 0500  CBC  Daily,   R     Question:  Specimen collection method  Answer:  Lab=Lab collect   05/15/24 0719   05/16/24 0500  Basic metabolic panel with GFR  Daily,   R     Question:  Specimen collection method  Answer:  Lab=Lab collect   05/15/24 0719          Antimicrobials/Microbiology: Anti-infectives (From admission, onward)    None         Component Value Date/Time   SDES  04/23/2024 1229    URINE, CLEAN CATCH Performed at [facility], [address] [place], [place] [address]    [facility]  04/23/2024 1229    NONE Performed at [facility], [address] [place], [place] [address]    CULT 80,000 COLONIES/mL PROVIDENCIA STUARTII (A) 04/23/2024 1229   REPTSTATUS 04/25/2024 FINAL 04/23/2024 1229    Procedures: Procedure(s) (LRB): RIGHT HEART CATH (N/A)  Mennie LAMY, MD [facility] 05/18/2024, 9:00 AM

## 2024-05-18 NOTE — Progress Notes (Signed)
 Advanced Heart Failure Rounding Note  Cardiologist: None  Chief Complaint: A/C HFpEF Subjective:    Diuresed ~30 lbs. Holding further diuretics. SCr stable.   Feels ok this morning. Ambulated in the room yesterday. Husband at bedside. Denies CP/SOB.   Objective:   Weight Range: 111 kg Body mass index is 35.11 kg/m.   Vital Signs:   Temp:  [97.6 F (36.4 C)-98.8 F (37.1 C)] 98.8 F (37.1 C) (09/22 0443) Pulse Rate:  [73-90] 83 (09/22 0443) Resp:  [13-21] 14 (09/22 0452) BP: (103-123)/(44-66) 103/56 (09/22 0443) SpO2:  [90 %-100 %] 100 % (09/22 0443) Weight:  [888 kg] 111 kg (09/22 0452) Last BM Date : 05/16/24  Weight change: Filed Weights   05/16/24 0351 05/17/24 0525 05/18/24 0452  Weight: 110.6 kg 110.9 kg 111 kg    Intake/Output:   Intake/Output Summary (Last 24 hours) at 05/18/2024 0715 Last data filed at 05/18/2024 0500 Gross per 24 hour  Intake 1364.79 ml  Output 1650 ml  Net -285.21 ml   Physical Exam   General:  chronically ill appearing.  No respiratory difficulty Neck: JVD ~7 cm.  Cor: Regular rate & rhythm. No murmurs. Lungs: exp wheeze Extremities: no edema  Neuro: alert & oriented x 3. Affect flat.  Telemetry   NSR 80s (Personally reviewed)    Labs    CBC Recent Labs    05/17/24 0223 05/18/24 0324  WBC 12.1* 10.8*  HGB 9.7* 9.1*  HCT 31.6* 29.9*  MCV 87.3 87.9  PLT 427* 385   Basic Metabolic Panel Recent Labs    90/80/74 0753 05/16/24 0303 05/17/24 0223 05/18/24 0324  NA 143   < > 138 135  K 3.8   < > 3.6 3.8  CL 99   < > 95* 96*  CO2 32   < > 32 30  GLUCOSE 109*   < > 146* 144*  BUN 19   < > 19 12  CREATININE 0.81   < > 1.08* 1.03*  CALCIUM  9.0   < > 9.0 8.9  MG 2.3  --   --   --    < > = values in this interval not displayed.   Liver Function Tests No results for input(s): AST, ALT, ALKPHOS, BILITOT, PROT, ALBUMIN in the last 72 hours. No results for input(s): LIPASE, AMYLASE in the last 72  hours. Cardiac Enzymes No results for input(s): CKTOTAL, CKMB, CKMBINDEX, TROPONINI in the last 72 hours.  BNP: BNP (last 3 results) Recent Labs    04/23/24 1236 04/29/24 1154 05/11/24 1601  BNP 36.0 31.0 53.0    ProBNP (last 3 results) No results for input(s): PROBNP in the last 8760 hours.   D-Dimer No results for input(s): DDIMER in the last 72 hours. Hemoglobin A1C No results for input(s): HGBA1C in the last 72 hours. Fasting Lipid Panel No results for input(s): CHOL, HDL, LDLCALC, TRIG, CHOLHDL, LDLDIRECT in the last 72 hours. Thyroid  Function Tests No results for input(s): TSH, T4TOTAL, T3FREE, THYROIDAB in the last 72 hours.  Invalid input(s): FREET3  Other results:   Imaging    No results found.   Medications:     Scheduled Medications:  alum & mag hydroxide-simeth  15 mL Oral Once   aspirin   81 mg Oral Pre-Cath   budesonide  (PULMICORT ) nebulizer solution  0.5 mg Nebulization BID   busPIRone   15 mg Oral TID   Chlorhexidine  Gluconate Cloth  6 each Topical Q0600   hydrocortisone   20 mg Oral  Daily   And   hydrocortisone   10 mg Oral Daily   levothyroxine   50 mcg Oral QAC breakfast   linaclotide   145 mcg Oral QAC breakfast   midodrine   5 mg Oral BID WC   pantoprazole   40 mg Oral BID   polyethylene glycol  17 g Oral BID   revefenacin   175 mcg Nebulization Daily   rosuvastatin   20 mg Oral Daily   senna-docusate  2 tablet Oral QHS   topiramate   50 mg Oral Q lunch    Infusions:  sodium chloride  10 mL/hr at 05/17/24 2214   heparin  1,850 Units/hr (05/17/24 1920)    PRN Medications: acetaminophen  **OR** acetaminophen , albuterol , alum & mag hydroxide-simeth, hydrOXYzine , Muscle Rub, nitroGLYCERIN , ondansetron  **OR** ondansetron  (ZOFRAN ) IV, oxyCODONE   Patient Profile  63 year old with a history of HFpEF, bradycardia, PAF, pulmonary hypertension, adrenal insufficiency, AVN, compression fractures, and chronic pain.     Admitted to [facility] with A/C HFpEF  Assessment/Plan   # A/C HFpEF Echo LVEF 60-65% RV normal. Grade I DD.  Admitted last month with A/C HFpEF.  Diuresed well with IV lasix . ~30lbs. Holding further diuretics, plan to dose and add PO after RHC.  Renal stable. She has been on midodrine  5 mg twice a day.    #Pulmonary Sarcoid/ILD #Chronic Hypoxic Respiratory Failure, on 5 liters [place] Previously on prednisone  but stopped with multiple compression fractures and AVN.  RHC pending   #Chronic Pain #Compression Fractures  On Oxy 5 times a day.   GOC: palliative care following    Length of Stay: 7  Beckey LITTIE Coe, NP  05/18/2024, 7:15 AM  Advanced Heart Failure Team Pager [phone] (M-F; 7a - 5p)  Please contact [facility] for night-coverage after hours (5p -7a ) and weekends on [email or web address]

## 2024-05-18 NOTE — Plan of Care (Signed)
  Problem: Education: Goal: Knowledge of General Education information will improve Description: Including pain rating scale, medication(s)/side effects and non-pharmacologic comfort measures Outcome: Progressing   Problem: Health Behavior/Discharge Planning: Goal: Ability to manage health-related needs will improve Outcome: Progressing   Problem: Clinical Measurements: Goal: Ability to maintain clinical measurements within normal limits will improve Outcome: Progressing Goal: Will remain free from infection Outcome: Progressing Goal: Diagnostic test results will improve Outcome: Progressing Goal: Respiratory complications will improve Outcome: Progressing Goal: Cardiovascular complication will be avoided Outcome: Progressing   Problem: Activity: Goal: Risk for activity intolerance will decrease Outcome: Progressing   Problem: Nutrition: Goal: Adequate nutrition will be maintained Outcome: Progressing   Problem: Coping: Goal: Level of anxiety will decrease Outcome: Progressing   Problem: Elimination: Goal: Will not experience complications related to bowel motility Outcome: Progressing Goal: Will not experience complications related to urinary retention Outcome: Progressing   Problem: Pain Managment: Goal: General experience of comfort will improve and/or be controlled Outcome: Progressing   Problem: Safety: Goal: Ability to remain free from injury will improve Outcome: Progressing   Problem: Skin Integrity: Goal: Risk for impaired skin integrity will decrease Outcome: Progressing   Problem: Education: Goal: Ability to demonstrate management of disease process will improve Outcome: Progressing Goal: Ability to verbalize understanding of medication therapies will improve Outcome: Progressing Goal: Individualized Educational Video(s) Outcome: Progressing   Problem: Activity: Goal: Capacity to carry out activities will improve Outcome: Progressing    Problem: Cardiac: Goal: Ability to achieve and maintain adequate cardiopulmonary perfusion will improve Outcome: Progressing   Problem: Education: Goal: Understanding of CV disease, CV risk reduction, and recovery process will improve Outcome: Progressing Goal: Individualized Educational Video(s) Outcome: Progressing   Problem: Activity: Goal: Ability to return to baseline activity level will improve Outcome: Progressing   Problem: Cardiovascular: Goal: Ability to achieve and maintain adequate cardiovascular perfusion will improve Outcome: Progressing Goal: Vascular access site(s) Level 0-1 will be maintained Outcome: Progressing   Problem: Health Behavior/Discharge Planning: Goal: Ability to safely manage health-related needs after discharge will improve Outcome: Progressing

## 2024-05-18 NOTE — Plan of Care (Signed)
  Problem: Elimination: Goal: Will not experience complications related to bowel motility Outcome: Progressing   Problem: Pain Managment: Goal: General experience of comfort will improve and/or be controlled Outcome: Progressing   Problem: Cardiac: Goal: Ability to achieve and maintain adequate cardiopulmonary perfusion will improve Outcome: Progressing   Problem: Activity: Goal: Ability to return to baseline activity level will improve Outcome: Progressing

## 2024-05-18 NOTE — Progress Notes (Signed)
 ANTICOAGULATION CONSULT NOTE  Pharmacy Consult for heparin  Indication: atrial fibrillation  Allergies  Allergen Reactions   Bee Venom Anaphylaxis   Contrast Media [Iodinated Contrast Media] Other (See Comments)    CT chest w/ contrast at OSH followed by SOB resolved w/ single dose steroids, never ENT swelling, intubation or pressors, has had multiple contrasted scans since   Bactrim  [Sulfamethoxazole -Trimethoprim ] Nausea And Vomiting   Codeine Itching    Patient Measurements: Height: 5' 10 (177.8 cm) Weight: 111 kg (244 lb 11.4 oz) IBW/kg (Calculated) : 68.5 Heparin  Dosing Weight: 95.6 kg  Vital Signs: Temp: 98.8 F (37.1 C) (09/22 0443) Temp Source: Oral (09/22 0443) BP: 103/56 (09/22 0443) Pulse Rate: 83 (09/22 0443)  Labs: Recent Labs    05/16/24 0303 05/16/24 1831 05/17/24 0223 05/17/24 0744 05/17/24 1423 05/17/24 2301 05/18/24 0324  HGB 10.4*  --  9.7*  --   --   --  9.1*  HCT 34.8*  --  31.6*  --   --   --  29.9*  PLT 500*  --  427*  --   --   --  385  HEPARINUNFRC  --    < > 0.28*   < > 0.20* 0.34 0.36  CREATININE 0.93  --  1.08*  --   --   --  1.03*   < > = values in this interval not displayed.    Estimated Creatinine Clearance: 77.4 mL/min (A) (by C-G formula based on SCr of 1.03 mg/dL (H)).  Medical History: Past Medical History:  Diagnosis Date   Adrenal insufficiency (HCC)    Anemia    Anxiety    Avascular bone necrosis (HCC)    Breast fibrocystic disorder    Chronic pain    Encounter for long-term opiate analgesic use 02/02/2020   Patient has chronic pain with femoral head avascular necrosis.  Is under pain contract   Fibromyalgia    Gastroesophageal reflux disease    Hypothyroidism    Mitral valve prolapse    Orthostatic hypotension    Peripheral neuropathy    PONV (postoperative nausea and vomiting)    Restrictive lung disease    Moderate to severe   Sarcoidosis    Biopsy proven - [facility]   Sarcoidosis    SOB (shortness of breath)     chronic    Medications:  See MAR  Assessment: 63 yo female presents with acute on chronic HFpEF now with paroxysmal AF RVR episode this morning 9/20 .  Not on anticoagulation prior to admission.  Pharmacy consulted for heparin  dosing.  Per chart review, only PMH of atrial fibrillation was on presentation to [facility] ED in 2017.  Patient was started on amiodarone  and heparin  infusion and transferred to [facility] for further workup but self converted to SR upon transfer at which time heparin  and amiodarone  were discontinued and not restarted.   -heparin  level= 0.36 on heparin  1850 units/hr -possible RHC today  Goal of Therapy:  Heparin  level 0.3-0.7 units/ml Monitor platelets by anticoagulation protocol: Yes   Plan:  -Continue heparin  1850 units/hr -Daily heparin  level and CBC  Prentice Poisson, PharmD Clinical Pharmacist **Pharmacist phone directory can now be found on [email or web address] ([email or web address]).  Listed under [facility].

## 2024-05-18 NOTE — Progress Notes (Signed)
 ANTICOAGULATION CONSULT NOTE  Pharmacy Consult for heparin  Indication: atrial fibrillation  Allergies  Allergen Reactions   Bee Venom Anaphylaxis   Contrast Media [Iodinated Contrast Media] Other (See Comments)    CT chest w/ contrast at OSH followed by SOB resolved w/ single dose steroids, never ENT swelling, intubation or pressors, has had multiple contrasted scans since   Bactrim  [Sulfamethoxazole -Trimethoprim ] Nausea And Vomiting   Codeine Itching    Patient Measurements: Height: 5' 10 (177.8 cm) Weight: 111 kg (244 lb 11.4 oz) IBW/kg (Calculated) : 68.5 Heparin  Dosing Weight: 95.6 kg  Vital Signs: Temp: 98.5 F (36.9 C) (09/22 1229) Temp Source: Oral (09/22 1229) BP: 114/66 (09/22 1443) Pulse Rate: 86 (09/22 1443)  Labs: Recent Labs    05/16/24 0303 05/16/24 1831 05/17/24 0223 05/17/24 0744 05/17/24 1423 05/17/24 2301 05/18/24 0324  HGB 10.4*  --  9.7*  --   --   --  9.1*  HCT 34.8*  --  31.6*  --   --   --  29.9*  PLT 500*  --  427*  --   --   --  385  HEPARINUNFRC  --    < > 0.28*   < > 0.20* 0.34 0.36  CREATININE 0.93  --  1.08*  --   --   --  1.03*   < > = values in this interval not displayed.    Estimated Creatinine Clearance: 77.4 mL/min (A) (by C-G formula based on SCr of 1.03 mg/dL (H)).  Medical History: Past Medical History:  Diagnosis Date   Adrenal insufficiency    Anemia    Anxiety    Avascular bone necrosis (HCC)    Breast fibrocystic disorder    Chronic pain    Encounter for long-term opiate analgesic use 02/02/2020   Patient has chronic pain with femoral head avascular necrosis.  Is under pain contract   Fibromyalgia    Gastroesophageal reflux disease    Hypothyroidism    Mitral valve prolapse    Orthostatic hypotension    Peripheral neuropathy    PONV (postoperative nausea and vomiting)    Restrictive lung disease    Moderate to severe   Sarcoidosis    Biopsy proven - [facility]   Sarcoidosis    SOB (shortness of breath)     chronic    Medications:  See MAR  Assessment: 63 yo female presents with acute on chronic HFpEF now with paroxysmal AF RVR episode this morning 9/20 .  Not on anticoagulation prior to admission.  Pharmacy consulted for heparin  dosing.  Per chart review, only PMH of atrial fibrillation was on presentation to [facility] ED in 2017.  Patient was started on amiodarone  and heparin  infusion and transferred to [facility] for further workup but self converted to SR upon transfer at which time heparin  and amiodarone  were discontinued and not restarted.   Pt s/p RHC, heparin  resumed after.  Goal of Therapy:  Heparin  level 0.3-0.7 units/ml Monitor platelets by anticoagulation protocol: Yes   Plan:  -Continue heparin  1850 units/hr -Daily heparin  level and CBC   Ozell Jamaica, PharmD, BCPS, [facility] Clinical Pharmacist [phone] Please check AMION for all [facility] numbers 05/18/2024

## 2024-05-19 ENCOUNTER — Encounter (HOSPITAL_COMMUNITY): Payer: Self-pay | Admitting: Cardiology

## 2024-05-19 ENCOUNTER — Other Ambulatory Visit (HOSPITAL_COMMUNITY): Payer: Self-pay

## 2024-05-19 ENCOUNTER — Telehealth (HOSPITAL_COMMUNITY): Payer: Self-pay | Admitting: Pharmacy Technician

## 2024-05-19 ENCOUNTER — Ambulatory Visit: Admitting: Family Medicine

## 2024-05-19 DIAGNOSIS — Z711 Person with feared health complaint in whom no diagnosis is made: Secondary | ICD-10-CM

## 2024-05-19 DIAGNOSIS — R601 Generalized edema: Secondary | ICD-10-CM | POA: Diagnosis not present

## 2024-05-19 DIAGNOSIS — Z515 Encounter for palliative care: Secondary | ICD-10-CM | POA: Diagnosis not present

## 2024-05-19 DIAGNOSIS — I5033 Acute on chronic diastolic (congestive) heart failure: Secondary | ICD-10-CM | POA: Diagnosis not present

## 2024-05-19 DIAGNOSIS — R0609 Other forms of dyspnea: Secondary | ICD-10-CM | POA: Diagnosis not present

## 2024-05-19 LAB — CBC
HCT: 29.9 % — ABNORMAL LOW (ref 36.0–46.0)
Hemoglobin: 8.9 g/dL — ABNORMAL LOW (ref 12.0–15.0)
MCH: 26.7 pg (ref 26.0–34.0)
MCHC: 29.8 g/dL — ABNORMAL LOW (ref 30.0–36.0)
MCV: 89.8 fL (ref 80.0–100.0)
Platelets: 392 K/uL (ref 150–400)
RBC: 3.33 MIL/uL — ABNORMAL LOW (ref 3.87–5.11)
RDW: 17.1 % — ABNORMAL HIGH (ref 11.5–15.5)
WBC: 10 K/uL (ref 4.0–10.5)
nRBC: 0 % (ref 0.0–0.2)

## 2024-05-19 LAB — BASIC METABOLIC PANEL WITH GFR
Anion gap: 10 (ref 5–15)
BUN: 5 mg/dL — ABNORMAL LOW (ref 8–23)
CO2: 29 mmol/L (ref 22–32)
Calcium: 9.2 mg/dL (ref 8.9–10.3)
Chloride: 100 mmol/L (ref 98–111)
Creatinine, Ser: 0.87 mg/dL (ref 0.44–1.00)
GFR, Estimated: >60 mL/min (ref >60)
Glucose, Bld: 168 mg/dL — ABNORMAL HIGH (ref 70–99)
Potassium: 4.4 mmol/L (ref 3.5–5.1)
Sodium: 139 mmol/L (ref 135–145)

## 2024-05-19 LAB — HEPARIN LEVEL (UNFRACTIONATED)
Heparin Unfractionated: 0.23 [IU]/mL — ABNORMAL LOW (ref 0.30–0.70)
Heparin Unfractionated: 0.33 [IU]/mL (ref 0.30–0.70)

## 2024-05-19 MED ORDER — APIXABAN 5 MG PO TABS
5.0000 mg | ORAL_TABLET | Freq: Two times a day (BID) | ORAL | Status: DC
  Administered 2024-05-19 – 2024-05-23 (×9): 5 mg via ORAL
  Filled 2024-05-19 (×9): qty 1

## 2024-05-19 MED ORDER — BOOST / RESOURCE BREEZE PO LIQD CUSTOM
1.0000 | Freq: Two times a day (BID) | ORAL | Status: DC
Start: 2024-05-20 — End: 2024-05-23
  Administered 2024-05-20 – 2024-05-23 (×4): 1 via ORAL

## 2024-05-19 MED ORDER — CALCIUM CARBONATE ANTACID 500 MG PO CHEW
1.0000 | CHEWABLE_TABLET | Freq: Once | ORAL | Status: AC
  Administered 2024-05-19: 200 mg via ORAL
  Filled 2024-05-19: qty 1

## 2024-05-19 MED ORDER — TORSEMIDE 20 MG PO TABS
20.0000 mg | ORAL_TABLET | Freq: Every day | ORAL | Status: DC
  Administered 2024-05-19 – 2024-05-23 (×5): 20 mg via ORAL
  Filled 2024-05-19 (×5): qty 1

## 2024-05-19 NOTE — Progress Notes (Signed)
 ANTICOAGULATION CONSULT NOTE  Pharmacy Consult for heparin  Indication: atrial fibrillation  Allergies  Allergen Reactions   Bee Venom Anaphylaxis   Contrast Media [Iodinated Contrast Media] Other (See Comments)    CT chest w/ contrast at OSH followed by SOB resolved w/ single dose steroids, never ENT swelling, intubation or pressors, has had multiple contrasted scans since   Bactrim  [Sulfamethoxazole -Trimethoprim ] Nausea And Vomiting   Codeine Itching    Patient Measurements: Height: 5' 10 (177.8 cm) Weight: 112.9 kg (248 lb 14.4 oz) IBW/kg (Calculated) : 68.5 Heparin  Dosing Weight: 95.6 kg  Vital Signs: Temp: 97.9 F (36.6 C) (09/23 0749) Temp Source: Oral (09/23 0749) BP: 122/45 (09/23 0749) Pulse Rate: 88 (09/23 0819)  Labs: Recent Labs    05/17/24 0223 05/17/24 0744 05/18/24 0324 05/18/24 1442 05/18/24 1443 05/19/24 0235 05/19/24 1018  HGB 9.7*  --  9.1* 9.9* 9.9* 8.9*  --   HCT 31.6*  --  29.9* 29.0* 29.0* 29.9*  --   PLT 427*  --  385  --   --  392  --   HEPARINUNFRC 0.28*   < > 0.36  --   --  0.23* 0.33  CREATININE 1.08*  --  1.03*  --   --  0.87  --    < > = values in this interval not displayed.    Estimated Creatinine Clearance: 92.5 mL/min (by C-G formula based on SCr of 0.87 mg/dL).   Assessment: 63 yo female presents with acute on chronic HFpEF now with paroxysmal AF RVR episode this morning 9/20 .  Not on anticoagulation prior to admission.  Pharmacy consulted for heparin  dosing.  Per chart review, only PMH of atrial fibrillation was on presentation to [facility] in 2017.  Patient was started on amiodarone  and heparin  infusion and transferred to [facility] for further workup but self converted to SR upon transfer at which time heparin  and amiodarone  were discontinued and not restarted.   Pt s/p RHC, heparin  resumed after.  Heparin  level is therapeutic at 0.33 on 2050 units/hr.   Goal of Therapy:  Heparin  level 0.3-0.7 units/ml Monitor platelets by  anticoagulation protocol: Yes   Plan:  -Continue heparin  drip 2050 units/hr -Daily heparin  level and CBC -Will follow plans for possible oral anticoagulation  Thank you for involving pharmacy in this patient's care.   Prentice Poisson, PharmD Clinical Pharmacist **Pharmacist phone directory can now be found on [email or web address] ([email or web address]).  Listed under [facility].

## 2024-05-19 NOTE — Plan of Care (Signed)
  Problem: Education: Goal: Knowledge of General Education information will improve Description: Including pain rating scale, medication(s)/side effects and non-pharmacologic comfort measures Outcome: Progressing   Problem: Clinical Measurements: Goal: Ability to maintain clinical measurements within normal limits will improve Outcome: Progressing Goal: Will remain free from infection Outcome: Progressing Goal: Diagnostic test results will improve Outcome: Progressing Goal: Respiratory complications will improve Outcome: Progressing Goal: Cardiovascular complication will be avoided Outcome: Progressing   Problem: Nutrition: Goal: Adequate nutrition will be maintained Outcome: Progressing   Problem: Elimination: Goal: Will not experience complications related to bowel motility Outcome: Progressing   Problem: Pain Managment: Goal: General experience of comfort will improve and/or be controlled Outcome: Progressing   Problem: Safety: Goal: Ability to remain free from injury will improve Outcome: Progressing   Problem: Skin Integrity: Goal: Risk for impaired skin integrity will decrease Outcome: Progressing   Problem: Activity: Goal: Capacity to carry out activities will improve Outcome: Progressing   Problem: Cardiac: Goal: Ability to achieve and maintain adequate cardiopulmonary perfusion will improve Outcome: Progressing   Problem: Education: Goal: Understanding of CV disease, CV risk reduction, and recovery process will improve Outcome: Progressing   Problem: Activity: Goal: Ability to return to baseline activity level will improve Outcome: Progressing   Problem: Cardiovascular: Goal: Ability to achieve and maintain adequate cardiovascular perfusion will improve Outcome: Progressing Goal: Vascular access site(s) Level 0-1 will be maintained Outcome: Progressing

## 2024-05-19 NOTE — Progress Notes (Signed)
 ANTICOAGULATION CONSULT NOTE  Pharmacy Consult for heparin  Indication: atrial fibrillation  Allergies  Allergen Reactions   Bee Venom Anaphylaxis   Contrast Media [Iodinated Contrast Media] Other (See Comments)    CT chest w/ contrast at OSH followed by SOB resolved w/ single dose steroids, never ENT swelling, intubation or pressors, has had multiple contrasted scans since   Bactrim  [Sulfamethoxazole -Trimethoprim ] Nausea And Vomiting   Codeine Itching    Patient Measurements: Height: 5' 10 (177.8 cm) Weight: 111 kg (244 lb 11.4 oz) IBW/kg (Calculated) : 68.5 Heparin  Dosing Weight: 95.6 kg  Vital Signs: Temp: 98.5 F (36.9 C) (09/23 0038) Temp Source: Oral (09/23 0038) BP: 105/44 (09/23 0038) Pulse Rate: 89 (09/23 0038)  Labs: Recent Labs    05/17/24 0223 05/17/24 0744 05/17/24 2301 05/18/24 0324 05/18/24 1442 05/18/24 1443 05/19/24 0235  HGB 9.7*  --   --  9.1* 9.9* 9.9* 8.9*  HCT 31.6*  --   --  29.9* 29.0* 29.0* 29.9*  PLT 427*  --   --  385  --   --  392  HEPARINUNFRC 0.28*   < > 0.34 0.36  --   --  0.23*  CREATININE 1.08*  --   --  1.03*  --   --  0.87   < > = values in this interval not displayed.    Estimated Creatinine Clearance: 91.7 mL/min (by C-G formula based on SCr of 0.87 mg/dL).   Assessment: 63 yo female presents with acute on chronic HFpEF now with paroxysmal AF RVR episode this morning 9/20 .  Not on anticoagulation prior to admission.  Pharmacy consulted for heparin  dosing.  Per chart review, only PMH of atrial fibrillation was on presentation to [facility] ED in 2017.  Patient was started on amiodarone  and heparin  infusion and transferred to [facility] for further workup but self converted to SR upon transfer at which time heparin  and amiodarone  were discontinued and not restarted.   Pt s/p RHC, heparin  resumed after.  Heparin  level is subtherapeutic at 0.23 on 1850 units/hr. No bleeding or infusion issues per RN. Hgb down to 8.9, platelets are normal.    Goal of Therapy:  Heparin  level 0.3-0.7 units/ml Monitor platelets by anticoagulation protocol: Yes   Plan:  -Increase heparin  drip to 2050 units/hr -6h heparin  level -Daily heparin  level and CBC -Monitor for s/sx of bleeding  Thank you for involving pharmacy in this patient's care.  Delon Sax, PharmD, BCPS Clinical Pharmacist 05/19/2024 3:58 AM

## 2024-05-19 NOTE — Progress Notes (Signed)
 Initial Nutrition Assessment  DOCUMENTATION CODES:   Not applicable  INTERVENTION:  Diet Heart  Boost Breeze po BID, each supplement provides 250 kcal and 9 grams of protein   NUTRITION DIAGNOSIS:    (Decreased nutrient (fluid) needs) related to chronic illness as evidenced by  (patient presenting with edema and volume overload due to heart failure).    GOAL:   Patient will meet greater than or equal to 90% of their needs    MONITOR:   PO intake, Weight trends, Supplement acceptance  REASON FOR ASSESSMENT:   Consult Other (Comment) (Low Albumin levels.)  ASSESSMENT:   PMH of heart failure, paroxysmal atrial fibrillation, sarcoidosis, chronic respiratory failure on oxygen , adrenal insufficiency, chronic pain presented secondary to shortness of breath and swelling, found to have evidence of acute on chronic heart failure, cardiology was consulted, patient was admitted and started on IV diuretics  Met with patient and spouse at bedside. Patient reports good po intake this admission and throughout the year, noted to have multiple hospitalizations.  Patient admitted with significant volume overload, presented with +2 pitting edema. Patient unsure of usual body weight however dry wt suspected to be around 240 lbs. Patient diuresed this admission, down from 272 lbs on admit. Chart hx skewed due to multiple hospitalizations this year with ongoing issues to manage fluid status. Reports constipation at baseline and takes linzess , currently ordered along with miralax  BID. Patient requesting to try fruit flavored supplement, will order boost breeze between meals.    Labs: Glu 168    alum & mag hydroxide-simeth  15 mL Oral Once   apixaban   5 mg Oral BID   budesonide  (PULMICORT ) nebulizer solution  0.5 mg Nebulization BID   busPIRone   15 mg Oral TID   Chlorhexidine  Gluconate Cloth  6 each Topical Q0600   hydrocortisone   20 mg Oral Daily   And   hydrocortisone   10 mg Oral Daily    levothyroxine   50 mcg Oral QAC breakfast   linaclotide   145 mcg Oral QAC breakfast   midodrine   5 mg Oral BID WC   pantoprazole   40 mg Oral BID   polyethylene glycol  17 g Oral BID   revefenacin   175 mcg Nebulization Daily   rosuvastatin   20 mg Oral Daily   senna-docusate  2 tablet Oral QHS   sodium chloride  flush  3 mL Intravenous Q12H   topiramate   50 mg Oral Q lunch   torsemide   20 mg Oral Daily   Wt Readings from Last 10 Encounters:  05/19/24 112.9 kg  05/01/24 113.8 kg  02/21/24 126.4 kg  01/25/24 125.6 kg  01/23/24 130.9 kg  01/03/24 135.7 kg  12/07/23 108.9 kg  10/28/23 108.9 kg  09/27/23 108.9 kg  09/11/23 108.8 kg   Weight Information (since admission)     Date/Time Weight Weight in lbs BSA (Calculated - sq m) BMI (Calculated) Who   05/19/24 0712 112.9 kg 248.9 lbs -- 35.71 EF   05/18/24 0452 111 kg 244.71 lbs -- 35.11 TF   05/17/24 0525 110.9 kg 244.49 lbs -- 35.08 DB   05/16/24 0351 110.6 kg 243.83 lbs -- 34.99 DB   05/15/24 0456 116 kg 255.73 lbs -- 36.69 JR   05/14/24 2054 118.9 kg 262.13 lbs 2.42 sq meters 37.61 KA   05/13/24 0440 120 kg 264.55 lbs -- 37.96 LE   05/12/24 0425 119.6 kg 263.67 lbs -- 37.83 MP   05/11/24 2043 121.2 kg 267.2 lbs 2.45 sq meters 38.34 MP  05/11/24 1545 123.9 kg 273.1 lbs -- 39.19 BB   05/11/24 1544 123.4 kg 272 lbs 2.47 sq meters 39.03 BB        NUTRITION - FOCUSED PHYSICAL EXAM:  Flowsheet Row Most Recent Value  Orbital Region No depletion  Upper Arm Region Mild depletion  Thoracic and Lumbar Region No depletion  Buccal Region No depletion  Temple Region No depletion  Clavicle Bone Region No depletion  Clavicle and Acromion Bone Region No depletion  Scapular Bone Region No depletion  Dorsal Hand No depletion  Patellar Region No depletion  Anterior Thigh Region No depletion  Posterior Calf Region No depletion  Edema (RD Assessment) --  [non pitting]  Hair Reviewed  Eyes Reviewed  Mouth Reviewed  Skin Reviewed   Nails Reviewed    Diet Order:   Diet Order             Diet Heart Room service appropriate? Yes; Fluid consistency: Thin  Diet effective now                   EDUCATION NEEDS:   No education needs have been identified at this time  Skin:  Skin Assessment: Reviewed RN Assessment  Last BM:  9/21  Height:   Ht Readings from Last 1 Encounters:  05/14/24 5' 10 (1.778 m)    Weight:   Wt Readings from Last 1 Encounters:  05/19/24 112.9 kg    Ideal Body Weight:  68.18 kg  BMI:  Body mass index is 35.71 kg/m.  Estimated Nutritional Needs:   Kcal:  8294-7953 kcals  Protein:  68-82 grams  Fluid:  <2L/d   Waynetta Metheny, MS, RD, LDN Clinical Dietitian  Please see AMiON for contact information.

## 2024-05-19 NOTE — Progress Notes (Signed)
 Daily Progress Note   Patient Name: Kathleen [name]       Date: 05/19/2024 DOB: [birth year]  Age: 63 y.o. MRN#: [id number] Attending Physician: Christobal Guadalajara, MD Primary Care Physician: Alphonsa Glendia LABOR, MD Admit Date: 05/11/2024  Reason for Consultation/Follow-up: Establishing goals of care  Subjective: I have reviewed medical records including EPIC notes, MAR, any available advanced directives which are none, and labs. Renal function is currently stable and RHC stable. Received report from primary RN - no acute concerns.   Went to visit patient this afternoon - no family/visitors present. Patient was lying in bed awake, alert, oriented, and able to participate in conversation. No signs or non-verbal gestures of pain or discomfort noted. No respiratory distress, increased work of breathing, or secretions noted. She has mild conversational dyspnea.   Emotional support provided to patient. She immediately informs me she is not interested in palliative care. Gentle reviewed that Palliative Care and hospice care are different.   Discussion on the difference between Palliative and Hospice care. Palliative care and hospice have similar goals of managing symptoms, promoting comfort, improving quality of life, and maintaining a person's dignity. However, palliative care may be offered during any phase of a serious illness, while hospice care is usually offered when a person is expected to live for 6 months or less. She is open to meeting today.  I gently expressed concern about her overall condition and high risk for rehospitalization. At this time, patient states she does not want to shorten my days. She wants attempts at rehabilitation, though does recognize there is a time to live and a time to die.    Patient is clear she is not ready for hospice. Therapeutic listening provided as she reflects on the awful experience she had with a hospice organization that cared for her father. She tells me she wouldn't want hospice even in my last breath. Validated that she would not have to use hospice services if not desired.  Instead of hospice, we discussed option of transition to full comfort care in house if/when comfort for EOL was the goal. We talked about transition to comfort measures in house and what that would entail inclusive of medications to control pain, dyspnea, agitation, nausea, and itching. We discussed stopping all unnecessary measures aimed at prolonging life. Education provided that other non-pharmacological  interventions would be utilized for holistic support and comfort. All care would focus on how the patient is looking and feeling. She is familiar with comfort care in house, as they had her father transferred here for comfort care, out of hospice care. She would be open to this when the time is right.  Encouraged patient to consider DNR/DNI status understanding evidenced based poor outcomes in similar hospitalized patient, as the cause of arrest is likely associated with advanced chronic/terminal illness rather than an easily reversible acute cardio-pulmonary event.  I shared that even if we pursued resuscitation we would not able to resolve the underlying factors. I explained that DNR/DNI does not change the medical plan and it only comes into effect after a person has arrested (died).  It is a protective measure to keep us  from harming the patient in their last moments of life. Patient indicates she would want her husband to be part of this and further GOC conversations. At this time, she wishes to remain full code with understanding she would receive CPR, defibrillation, ACLS medications, and/or intubation.   Encouraged ongoing discussions with her husband regarding code status and  when comfort care may be appropriate. We did discuss that as people's health changes over time and quality of life declines, people often change their minds on what they are willing to go through based on the quality of life we would bring them back to - she expressed understanding. We discussed appropriate times for comfort care are when medical interventions are no longer working, someone no longer desires aggressive interventions, symptoms are getting harder to manage, frequent hospitalizations, poor quality of life. Patient expressed understanding.  Patient states she will speak with her husband and if they desire further GOC discussions with PMT she will call our services. Otherwise, her goals are clear and does not feel there are any other PMT needs at this time.   She does express appreciation for visit today.  All questions and concerns addressed. Encouraged to call with questions and/or concerns. PMT card provided.   Length of Stay: 8  Current Medications: Scheduled Meds:   alum & mag hydroxide-simeth  15 mL Oral Once   apixaban   5 mg Oral BID   budesonide  (PULMICORT ) nebulizer solution  0.5 mg Nebulization BID   busPIRone   15 mg Oral TID   Chlorhexidine  Gluconate Cloth  6 each Topical Q0600   hydrocortisone   20 mg Oral Daily   And   hydrocortisone   10 mg Oral Daily   levothyroxine   50 mcg Oral QAC breakfast   linaclotide   145 mcg Oral QAC breakfast   midodrine   5 mg Oral BID WC   pantoprazole   40 mg Oral BID   polyethylene glycol  17 g Oral BID   revefenacin   175 mcg Nebulization Daily   rosuvastatin   20 mg Oral Daily   senna-docusate  2 tablet Oral QHS   sodium chloride  flush  3 mL Intravenous Q12H   topiramate   50 mg Oral Q lunch   torsemide   20 mg Oral Daily    Continuous Infusions:  sodium chloride       PRN Meds: sodium chloride , acetaminophen  **OR** acetaminophen , acetaminophen , albuterol , alum & mag hydroxide-simeth, hydrOXYzine , Muscle Rub, nitroGLYCERIN ,  ondansetron  **OR** ondansetron  (ZOFRAN ) IV, ondansetron  (ZOFRAN ) IV, oxyCODONE , sodium chloride  flush  Physical Exam Vitals and nursing note reviewed.  Constitutional:      General: She is not in acute distress.    Appearance: She is obese.  Pulmonary:  Effort: No respiratory distress.  Skin:    General: Skin is warm and dry.  Neurological:     Mental Status: She is alert and oriented to person, place, and time.     Motor: Weakness present.  Psychiatric:        Attention and Perception: Attention normal.        Behavior: Behavior is cooperative.        Cognition and Memory: Cognition and memory normal.             Vital Signs: BP 127/69 (BP Location: Left Arm)   Pulse 85   Temp 98.1 F (36.7 C) (Oral)   Resp 14   Ht 5' 10 (1.778 m)   Wt 112.9 kg   LMP 05/17/2016   SpO2 99%   BMI 35.71 kg/m  SpO2: SpO2: 99 % O2 Device: O2 Device: Nasal Cannula O2 Flow Rate: O2 Flow Rate (L/min): 5 L/min  Intake/output summary:  Intake/Output Summary (Last 24 hours) at 05/19/2024 1513 Last data filed at 05/19/2024 1247 Gross per 24 hour  Intake 1823.14 ml  Output 2800 ml  Net -976.86 ml   LBM: Last BM Date : 05/17/24 Baseline Weight: Weight: 123.4 kg Most recent weight: Weight: 112.9 kg       Palliative Assessment/Data: PPS 30%      Patient Active Problem List   Diagnosis Date Noted   Acute on chronic heart failure with preserved ejection fraction (HFpEF) (HCC) 05/11/2024   UTI (urinary tract infection) 04/23/2024   Osteoporosis with current pathological fracture 03/17/2024   Anxiety 02/11/2024   Spinal compression fracture (HCC) 01/29/2024   Lower back pain 01/26/2024   Closed compression fracture of L3 lumbar vertebra, initial encounter (HCC) 01/21/2024   Intractable low back pain 01/03/2024   Ambulatory dysfunction 12/11/2023   Uncontrolled pain 12/08/2023   Pressure injury of skin 12/08/2023   Intractable back pain 12/07/2023   Prediabetes 10/28/2023   Ankle  fracture, bimalleolar, closed, left, sequela 03/10/2023   Closed fracture of left ankle 03/10/2023   Syndesmotic disruption of ankle, left, initial encounter 03/10/2023   Lumbar transverse process fracture (HCC) 01/02/2023   Acute on chronic hypoxic respiratory failure (HCC) 12/13/2022   Depression, major, single episode, moderate (HCC) 09/10/2022   Aortic atherosclerosis 09/10/2022   Chronic respiratory failure with hypoxia (HCC) 05/22/2021   Anxiety and depression 05/22/2021   Vitamin D  deficiency 05/09/2021   Left foot drop 01/09/2021   Pulmonary HTN (HCC) 10/12/2020   Encounter for long-term opiate analgesic use 02/02/2020   Interstitial lung disease (HCC) 04/14/2019   GERD (gastroesophageal reflux disease) 10/15/2017   Personal history of noncompliance with medical treatment, presenting hazards to health 04/26/2017   Hypothyroidism 01/15/2017   Adrenal insufficiency (Addison's disease) (HCC) 08/22/2016   Symptomatic bradycardia    Adrenal insufficiency 08/17/2016   Hypokalemia 08/17/2016   Chronic pain syndrome 04/20/2016   Irritable bowel syndrome with constipation 11/14/2015   Avascular necrosis of bone of hip, left (HCC) 04/05/2015   Post herpetic neuralgia 10/22/2014   Mixed hyperlipidemia 08/25/2014   Obstructive sleep apnea 02/18/2014   Fibromyalgia 02/11/2013   Bilateral lower extremity edema 01/22/2013   Hypotension 01/16/2013   Fibrocystic breast disease in female 04/09/2012   Sarcoidosis 06/21/2008    Palliative Care Assessment & Plan   Patient Profile: 63 y.o. female  with past medical history of heart failure, paroxysmal atrial fibrillation, pulm sarcoidosis, pulmonary hypertension, OSA, chronic respiratory failure on oxygen , adrenal insufficiency, chronic pain presented secondary to shortness of  breath and swelling, found to have evidence of acute on chronic heart failure  admitted on 05/11/2024 with acute on chronic heart failure.  Previously on prednisone  but  had AVN of hips and multiple compression fracture. Of note  left femoral head avascular necrosis was evaluated by ortho and not a candidate for surgery. Since that time she has been severely limited. She is bedbound except for [facility]. Her husband provides 24 hour care.    She has had multiple recent hospitalizations. PMT consulted to discuss GOC.   Assessment: Principal Problem:   Acute on chronic heart failure with preserved ejection fraction (HFpEF) (HCC) Active Problems:   Bilateral lower extremity edema   Obstructive sleep apnea   Chronic pain syndrome   Hypothyroidism   Interstitial lung disease (HCC)   Chronic respiratory failure with hypoxia (HCC)   Anxiety and depression   Adrenal insufficiency (Addison's disease) (HCC)   Concern about end of life  Recommendations/Plan: Continue to treat the treatable Continue full code Goal is for ongoing rehabilitation attempts. Patient understands she is high risk for rehospitalization and is ok with readmission if medically necessary Patient may want further GOC with husband present - she will reach out to PMT to schedule if desired PMT will continue to follow peripherally. If there are any imminent needs please call the service directly  Goals of Care and Additional Recommendations: Limitations on Scope of Treatment: Full Scope Treatment  Code Status:    Code Status Orders  (From admission, onward)           Start     Ordered   05/11/24 1930  Full code  Continuous       Question:  By:  Answer:  Consent: discussion documented in EHR   05/11/24 1932           Code Status History     Date Active Date Inactive Code Status Order ID Comments User Context   04/23/2024 1539 05/01/2024 1955 Full Code [id number]  Sherlon Brayton RAMAN, MD ED   02/19/2024 1338 02/25/2024 1456 Full Code [id number]  Urbano Albright, MD Inpatient   02/06/2024 1441 02/19/2024 1338 Limited: Do not attempt resuscitation (DNR) -DNR-LIMITED -Do Not Intubate/DNI   [id number]  Pegge Toribio PARAS, PA-C Inpatient   01/29/2024 1010 02/06/2024 1429 Limited: Do not attempt resuscitation (DNR) -DNR-LIMITED -Do Not Intubate/DNI  [id number]  Odell Celinda Balo, MD Inpatient   01/26/2024 0943 01/29/2024 1010 Full Code [id number]  Alto Isaiah CROME, NP Inpatient   01/26/2024 0425 01/26/2024 0943 Full Code [id number]  Manfred Driver, DO Inpatient   01/21/2024 0201 01/23/2024 2143 Full Code [id number]  Keturah Carrier, MD ED   01/03/2024 2051 01/08/2024 1946 Full Code [id number]  Elgergawy, Brayton RAMAN, MD ED   12/07/2023 2047 12/11/2023 1800 Full Code [id number]  Pearlean Tully BRAVO, MD ED   04/07/2023 0301 04/09/2023 2117 Full Code [id number]  Charlton Evalene RAMAN, MD ED   03/10/2023 1256 03/14/2023 2115 Full Code [id number]  Evonnie Lenis, MD ED   12/12/2022 2147 12/15/2022 1706 Full Code [id number]  Pearlean Tully BRAVO, MD Inpatient   05/22/2021 0009 05/22/2021 2101 Full Code [id number]  Manfred Driver, DO ED   01/27/2020 0334 01/28/2020 1527 Full Code [id number]  Drusilla Sabas RAMAN, MD ED   11/04/2017 2153 11/06/2017 1614 Full Code [id number]  Drusilla Sabas RAMAN, MD Inpatient   10/21/2017 2047 10/22/2017 1631 Full Code [id number]  Luke Agent, MD Inpatient   10/16/2017 0108 10/18/2017 1710 Full Code [id number]  Celinda Lenis Lot, MD ED  08/17/2016 1917 08/22/2016 0402 Full Code [id number]  Barbra Lang PARAS, DO Inpatient   12/28/2015 1624 01/02/2016 2154 Full Code [id number]  Claudene Maximino LABOR, MD ED   01/16/2013 2209 01/21/2013 1520 Full Code [id number]  Verdene Purchase, MD Inpatient       Prognosis:  Poor in the setting of recurrent hospitalizations and multiple comorbidities  Discharge Planning: To Be Determined  Care plan was discussed with primary RN, patient  Thank you for allowing the Palliative Medicine Team to assist in the care of this patient.   Total Time 50 minutes Prolonged Time Billed  no       Jeoffrey CHRISTELLA Claudene, NP  Please contact Palliative Medicine Team phone at [phone] for questions and concerns.    *Portions of this note are a verbal dictation therefore any spelling and/or grammatical errors are due to the Dragon [employer] One system interpretation.

## 2024-05-19 NOTE — Plan of Care (Signed)

## 2024-05-19 NOTE — TOC Progression Note (Signed)
 Transition of Care [facility]) - Progression Note    Patient Details  Name: Kathleen [name] MRN: [id number] Date of Birth: 1961-08-25  [facility] CM/SW Contact  Waddell Barnie Rama, RN Phone Number: 05/19/2024, 1:28 PM  Clinical Narrative:    Patient is active with Hedda , NCM notified Darleene to confirm.  Will need resumption orders for HHPT, HHOT when ready for dc.  ICM will continue to follow.   Expected Discharge Plan: Home w Home Health Services Barriers to Discharge: Continued Medical Work up               Expected Discharge Plan and Services In-house Referral: Clinical Social Work Discharge Planning Services: NA Post Acute Care Choice: Home Health Living arrangements for the past 2 months: Single Family Home                 DME Arranged: N/A DME Agency: NA                   Social Drivers of Health (SDOH) Interventions SDOH Screenings   Food Insecurity: No Food Insecurity (05/11/2024)  Housing: Low Risk  (05/11/2024)  Transportation Needs: No Transportation Needs (05/11/2024)  Utilities: Not At Risk (05/11/2024)  Alcohol Screen: Low Risk  (09/27/2023)  Depression (PHQ2-9): Medium Risk (04/23/2024)  Financial Resource Strain: Low Risk  (09/27/2023)  Physical Activity: Inactive (09/27/2023)  Social Connections: Socially Integrated (01/21/2024)  Stress: No Stress Concern Present (09/27/2023)  Tobacco Use: Low Risk  (05/11/2024)  Health Literacy: Medium Risk (12/20/2023)   Received from [facility]    Readmission Risk Interventions    05/12/2024   11:28 AM 04/27/2024    9:59 AM 01/27/2024   11:48 AM  Readmission Risk Prevention Plan  Transportation Screening Complete Complete Complete  Medication Review Oceanographer) Complete Complete Complete  PCP or Specialist appointment within 3-5 days of discharge Complete    HRI or Home Care Consult Complete Complete Complete  SW Recovery Care/Counseling Consult Complete Complete Complete  Palliative  Care Screening Not Applicable Not Applicable Not Applicable  Skilled Nursing Facility Not Applicable Not Applicable Patient Refused

## 2024-05-19 NOTE — Progress Notes (Signed)
 PROGRESS NOTE Kathleen [name]  FMW:[id number] DOB: 03/30/1961 DOA: 05/11/2024 PCP: Alphonsa Glendia LABOR, MD  Brief Narrative/Hospital Course: Kathleen [name] is a 63 y.o. female with PMH of heart failure, paroxysmal atrial fibrillation, sarcoidosis, chronic respiratory failure on oxygen , adrenal insufficiency, chronic pain presented secondary to shortness of breath and swelling, found to have evidence of acute on chronic heart failure, cardiology was consulted, patient was admitted and started on IV diuretics Patient is very ill with complex medical issues, with pulmonary sarcoidosis, previously on steroid and had AVN of the hip and multiple compression fracture, orthopedic felt not a candidate for surgery for AVN and has severely limited mobility, mostly bedbound except for [name].  She has chronic hypoxic tori failure on supplemental oxygen .. 9/19> transferred from [name] to [facility] for advanced heart failure team evaluation 9/22: s/p RHC.  Subjective: Seen and examined  Husband and multiple family at the bedside.  Patient is resting comfortably no complaints Overnight BP stable afebrile remains on 5 to 6 L or cannula ABG this morning shows pCO2 62 pH 7.3, CBC with hemoglobin 8.9 slightly lower, BMP stable weight 272 LB>>244LB> 24 lb same: About 30 pound down  Assessment and plan:  Acute on chronic HFpEF Left small effusion/pulmonary congestion Chronic dyspnea Recent admissions for acute on chronic HFpEF Mild hypokalemia from diuresis: PTA 22 LB weight gain w/ recent echo August EF 60 to 65% indeterminate diastolic function, elevated PASP in past. Managed with diuresis, transferred to [name] for AHF team eva l Recent pulmonary eval at [facility] requesting RHC to eval for underlying pulmonary hypertension given her sarcoid/ILD. With aggressive diuresis: About 30 pound weight loss and remains euvolemic, Lasix  discontinued 9/21> resuming torsemide  po. RHC 9/22: Mildly elevated right-sided filling  pressure no significant pulmonary hypertension normal PCWP preserved cardiac output Cont to monitor daily I/O,weight, electrolytes and replaceKeep on  salt/fluid restricted diet and monitor in tele. Net IO Since Admission: -26,809.63 mL [05/19/24 1016]  Filed Weights   05/17/24 0525 05/18/24 0452 05/19/24 9287  Weight: 110.9 kg 111 kg 112.9 kg    Recent Labs  Lab 05/14/24 0410 05/15/24 0244 05/15/24 0753 05/16/24 0303 05/17/24 0223 05/18/24 0324 05/18/24 1442 05/18/24 1443 05/19/24 0235  BUN 20 18 19 21 19 12   --   --  5*  CREATININE 0.86 0.69 0.81 0.93 1.08* 1.03*  --   --  0.87  K 4.3 3.8 3.8 3.4* 3.6 3.8 4.6 4.5 4.4  MG 2.3 2.4 2.3  --   --   --   --   --   --    New onset PA-fib: Patient started on heparin .  She had history of A-fib when she was transferred to [name] in 2017 but had converted to sinus rhythm upon transfer.  Heparin  resumed S/P RHC-await cardiology recommendation for long-term anticoagulation.   Pulmonary sarcoidosis/ILD Chronic hypoxic respiratory failure on 5 L [place]: Pulmonary status remains tenuous, continue home oxygen  setting RHC finding with normal pulmonary pressure as above.   she is followed by [name]  has been stopped, cannot tolerate methotrexate due to diarrhea hair loss, cannot tolerate CellCept due to diarrhea>advised pulmonary reha Last CT chest 5/26 /25> showed similar appearing chronic bilateral upper lobe paramediastinal architectural distortion, bronchiectasis and peribronchial consolidation/scaring  Normocytic anemia: Suspect positive chronic illness hemoglobin stable 9 to 10 g.  Monitor  Lethargy Weakness: Mental status improved stable.Minimize opioid/pain meds continue PT OT   Goals of care Bed bound w/ dyspnea Ambulatory dysfunction Frequent hospitalizations-she had 9  hospitalizations since January this year : Overall poor prognosis, question probably has progression of the disease.  Patient was transferred for cardiac cath RHC  without significant pulmonary hypertension. Palliative care continues to follow along She is at full code but I am very concerned , overall prognosis does not appear bright  OSA: Not using CPAP.  Chronic pain syndrome Anxiety/depression Pain controlled mood stable continue home meds including -BuSpar , Topamax , oxycodone   Adrenal insufficiency Hypothyroidism Chronic hypotension: Continue her synthroid , midodrine  and Cortef  bid.  Received IV Solu-Medrol  this admission  HLD Continue statin  Constipation: continue Linzess , MiraLAX .  Last bowel movement 9/20  Class I Obesity w/ Body mass index is 35.71 kg/m.: Will benefit with PCP follow-up, weight loss,healthy lifestyle  Mobility: PT Orders: Active PT Follow up Rec: Home Health Pt9/19/2025 1519   DVT prophylaxis:  Code Status:   Code Status: Full Code Family Communication: plan of care discussed with patient/husband at bedside has been updated. Patient status is: Remains hospitalized because of severity of illness Level of care: Telemetry Cardiac   Dispo: The patient is from: home            Anticipated disposition: TBD.  Objective: Vitals last 24 hrs: Vitals:   05/19/24 0407 05/19/24 0712 05/19/24 0749 05/19/24 0819  BP: (!) 141/46  (!) 122/45   Pulse: 90  89 88  Resp: 19  15   Temp: 98.4 F (36.9 C)  97.9 F (36.6 C)   TempSrc: Oral  Oral   SpO2: 98%  94% 98%  Weight:  112.9 kg    Height:        Physical Examination: General exam: Alert awake but appears older than her age, obese, sick looking , on 5l [place] HEENT:Oral mucosa moist, Ear/Nose WNL grossly Respiratory system: B/L clear BS, wheezing, use of accessory muscle Cardiovascular system: S1 & S2 +, No JVD. Gastrointestinal system: Abdomen soft,NT,ND, BS+ Nervous System: Bit sleepy but able to wake up and interact, moving extremities Extremities: extremities warm, leg edema  minimal Skin: No rashes,no icterus. MSK: Normal muscle bulk,tone, power    Medications reviewed:  Scheduled Meds:  alum & mag hydroxide-simeth  15 mL Oral Once   budesonide  (PULMICORT ) nebulizer solution  0.5 mg Nebulization BID   busPIRone   15 mg Oral TID   Chlorhexidine  Gluconate Cloth  6 each Topical Q0600   hydrocortisone   20 mg Oral Daily   And   hydrocortisone   10 mg Oral Daily   levothyroxine   50 mcg Oral QAC breakfast   linaclotide   145 mcg Oral QAC breakfast   midodrine   5 mg Oral BID WC   pantoprazole   40 mg Oral BID   polyethylene glycol  17 g Oral BID   revefenacin   175 mcg Nebulization Daily   rosuvastatin   20 mg Oral Daily   senna-docusate  2 tablet Oral QHS   sodium chloride  flush  3 mL Intravenous Q12H   topiramate   50 mg Oral Q lunch   torsemide   20 mg Oral Daily   Continuous Infusions:  sodium chloride      heparin  2,050 Units/hr (05/19/24 0409)   Diet: Diet Order             Diet Heart Room service appropriate? Yes; Fluid consistency: Thin  Diet effective now                   Data Reviewed: I have personally reviewed following labs and imaging studies ( see epic result tab) CBC: Recent Labs  Lab 05/13/24 1847 05/15/24 0753 05/16/24 0303 05/17/24 0223 05/18/24 0324 05/18/24 1442 05/18/24 1443 05/19/24 0235  WBC 11.9* 11.4* 13.6* 12.1* 10.8*  --   --  10.0  NEUTROABS 8.8*  --   --   --   --   --   --   --   HGB 8.7* 9.5* 10.4* 9.7* 9.1* 9.9* 9.9* 8.9*  HCT 30.0* 32.7* 34.8* 31.6* 29.9* 29.0* 29.0* 29.9*  MCV 92.9 90.6 88.5 87.3 87.9  --   --  89.8  PLT 494* 485* 500* 427* 385  --   --  392   CMP: Recent Labs  Lab 05/14/24 0410 05/15/24 0244 05/15/24 0753 05/16/24 0303 05/17/24 0223 05/18/24 0324 05/18/24 1442 05/18/24 1443 05/19/24 0235  NA 140 144 143 142 138 135 135 136 139  K 4.3 3.8 3.8 3.4* 3.6 3.8 4.6 4.5 4.4  CL 98 96* 99 94* 95* 96*  --   --  100  CO2 31 33* 32 31 32 30  --   --  29  GLUCOSE 272* 116* 109* 121* 146* 144*  --   --  168*  BUN 20 18 19 21 19 12   --   --  5*  CREATININE 0.86  0.69 0.81 0.93 1.08* 1.03*  --   --  0.87  CALCIUM  8.5* 9.0 9.0 9.1 9.0 8.9  --   --  9.2  MG 2.3 2.4 2.3  --   --   --   --   --   --    GFR: Estimated Creatinine Clearance: 92.5 mL/min (by C-G formula based on SCr of 0.87 mg/dL). No results for input(s): AST, ALT, ALKPHOS, BILITOT, PROT, ALBUMIN in the last 168 hours.   No results for input(s): LIPASE, AMYLASE in the last 168 hours.  No results for input(s): AMMONIA  in the last 168 hours. Coagulation Profile: No results for input(s): INR, PROTIME in the last 168 hours. Unresulted Labs (From admission, onward)     Start     Ordered   05/19/24 1030  Heparin  level (unfractionated)  Once-Timed,   TIMED       Question:  Specimen collection method  Answer:  Lab=Lab collect   05/19/24 0408   05/19/24 0500  Lipoprotein A (LPA)  Tomorrow morning,   R       Question:  Specimen collection method  Answer:  Lab=Lab collect   05/18/24 1525   05/17/24 0500  Heparin  level (unfractionated)  Daily,   R     Question:  Specimen collection method  Answer:  Lab=Lab collect   05/16/24 1110   05/16/24 0500  CBC  Daily,   R     Question:  Specimen collection method  Answer:  Lab=Lab collect   05/15/24 0719   05/16/24 0500  Basic metabolic panel with GFR  Daily,   R     Question:  Specimen collection method  Answer:  Lab=Lab collect   05/15/24 0719          Antimicrobials/Microbiology: Anti-infectives (From admission, onward)    None         Component Value Date/Time   SDES  04/23/2024 1229    URINE, CLEAN CATCH Performed at [facility], [address] [place], [place] [address]    [facility]  04/23/2024 1229    NONE Performed at [facility], [address] [place], [place] [address]    CULT 80,000 COLONIES/mL PROVIDENCIA STUARTII (A) 04/23/2024 1229   REPTSTATUS 04/25/2024 FINAL 04/23/2024 1229  Procedures: Procedure(s) (LRB): RIGHT HEART CATH (N/A)  Mennie LAMY, MD [facility] 05/19/2024, 10:17 AM

## 2024-05-19 NOTE — Progress Notes (Addendum)
 Physical Therapy Treatment Patient Details Name: Kathleen [name] MRN: [id number] DOB: January 31, 1961 Today's Date: 05/19/2024   History of Present Illness Nahomy R Topp 63 yo female presents with acute on chronic HFpEF now with paroxysmal AF RVR episode 9/20. PMH of heart failure, paroxysmal atrial fibrillation, sarcoidosis, chronic respiratory failure on oxygen , adrenal insufficiency, chronic pain.    PT Comments  Pt received in semi-sidelying toward her L, pt agreeable to therapy session with encouragement and is motivated to get OOB for bath/linen change per her report. Pt needing up to minA (+2 safety) for bed mobility with reliance on hospital bed features, and step pivot OOB to chair on her L side with SPC and HHA. Pt quick to fatigue with DOE 3/4 with pivot to chair, SpO2 WFL seated and standing on 5L O2 [place]. Pt asking about higher intensity post-acute rehab as she has been there before, PTA instructed her that at current level of function, 3 hours per day would be too intensive for her to tolerate, pt disagrees and states I could do it tomorrow, but not today. DME updated per discussion with pt and supervising PT Ryan L as she reports her bed does not raise up like the hospital bed does and she needs to use the bed rail at current level of function, which her bed at home does not have. Pt continues to benefit from PT services to progress toward functional mobility goals.     If plan is discharge home, recommend the following: A little help with bathing/dressing/bathroom;A lot of help with walking and/or transfers;Assistance with cooking/housework;Help with stairs or ramp for entrance;Assist for transportation   Can travel by private vehicle     No (can't sit >30 mins)  Equipment Recommendations  Hospital bed    Recommendations for Other Services       Precautions / Restrictions Precautions Precautions: Fall Recall of Precautions/Restrictions: Intact Precaution/Restrictions Comments:  Back for comfort Restrictions Weight Bearing Restrictions Per Provider Order: No     Mobility  Bed Mobility Overal bed mobility: Needs Assistance Bed Mobility: Sidelying to Sit   Sidelying to sit: Min assist, HOB elevated, Used rails (received in sidelying)       General bed mobility comments: pt requesting HOB raised to >50 deg prior to sitting up, heavy minA for trunk raising and bed rail assist.    Transfers Overall transfer level: Needs assistance Equipment used: Straight cane Transfers: Sit to/from Stand, Bed to chair/wheelchair/BSC Sit to Stand: Min assist, +2 safety/equipment   Step pivot transfers: Contact guard assist, +2 safety/equipment       General transfer comment: Pt able to stand with min assist on second trial (unable to stand with only CGA and cane), pivotal steps toward chair on her L side and pt using cane and bed rail for support PRN. Pt c/o severe dyspnea (SpO2 WFL on 5L O2 ) and agreeable to sit in recliner with encouragement.    Ambulation/Gait               General Gait Details: pt defers, c/o DOE 3/4 and BLE too fatigued/weak; BP stable pre/post stand pivot; about 49ft to pivot to chair from bed only prior to fatigue.   Stairs             Wheelchair Mobility     Tilt Bed    Modified Rankin (Stroke Patients Only)       Balance Overall balance assessment: Needs assistance Sitting-balance support: Single extremity supported, Feet supported Sitting balance-Leahy Scale:  Fair Sitting balance - Comments: fair balance in sitting, but generally uses bil UE for support   Standing balance support: Single extremity supported, During functional activity, No upper extremity supported Standing balance-Leahy Scale: Poor Standing balance comment: reliant on 1-2 UE support                            Communication Communication Communication: No apparent difficulties  Cognition Arousal: Lethargic Behavior During Therapy: WFL  for tasks assessed/performed, Anxious   PT - Cognitive impairments: Memory, Safety/Judgement                       PT - Cognition Comments: Decreased insight into deficits, pt reports eager to go to CIR but per chart review, she has already been notified that CIR not an option and had then decided on [facility] services. Pt states she can tolerate 3 hours of therapy per day, however fatigued after standing <3 mins at bedside today. Following commands: Intact      Cueing Cueing Techniques: Verbal cues  Exercises      General Comments General comments (skin integrity, edema, etc.): see BP in comments above; SpO2 weaned from 6L-5L on wall, RN notified, remains WFL on 5L standing and sitting. HR WFL      Pertinent Vitals/Pain Pain Assessment Pain Assessment: Faces Faces Pain Scale: Hurts even more Pain Location: back in supine/ sitting, possibly also hips, pt not very forthcoming when asked to localize pain. Pain Descriptors / Indicators: Discomfort, Grimacing, Guarding Pain Intervention(s): Limited activity within patient's tolerance, Monitored during session, Repositioned, Patient requesting pain meds-RN notified, Other (comment) (RN notified pt asking for voltaren  gel)    Home Living                          Prior Function            PT Goals (current goals can now be found in the care plan section) Acute Rehab PT Goals Patient Stated Goal: return home but I prefer high intensity rehab if I can get it. PT Goal Formulation: With patient Time For Goal Achievement: 05/29/24 Progress towards PT goals: Progressing toward goals (slowly)    Frequency    Min 3X/week      PT Plan      Co-evaluation              AM-PAC PT 6 Clicks Mobility   Outcome Measure  Help needed turning from your back to your side while in a flat bed without using bedrails?: A Little Help needed moving from lying on your back to sitting on the side of a flat bed without using  bedrails?: A Lot (w/o rail/HOB up) Help needed moving to and from a bed to a chair (including a wheelchair)?: A Lot Help needed standing up from a chair using your arms (e.g., wheelchair or bedside chair)?: A Lot Help needed to walk in hospital room?: Total (<63ft) Help needed climbing 3-5 steps with a railing? : Total 6 Click Score: 11    End of Session Equipment Utilized During Treatment: Oxygen  (pt defers gait so did not use gait belt, had +2 for safety to pivot) Activity Tolerance: Patient limited by fatigue Patient left: in chair;with call bell/phone within reach;with chair alarm set;with family/visitor present (spouse in room, he agrees to give her call bell that is next to him so she can call staff when wanting  to get back to bed from the chair.) Nurse Communication: Mobility status;Precautions;Other (comment) (c/o back pain, weaned to 5L) PT Visit Diagnosis: Other abnormalities of gait and mobility (R26.89);Pain;Unsteadiness on feet (R26.81) Pain - part of body:  (back, generalized)     Time: 8687-8660 PT Time Calculation (min) (ACUTE ONLY): 27 min  Charges:    $Therapeutic Activity: 23-37 mins PT General Charges $$ ACUTE PT VISIT: 1 Visit                     Selisa Tensley P., PTA Acute Rehabilitation Services Secure Chat Preferred 9a-5:30pm Office: [phone]    [name] HERO [facility] 05/19/2024, 2:04 PM

## 2024-05-19 NOTE — Discharge Instructions (Signed)
 Information on my medicine - ELIQUIS  (apixaban )  Why was Eliquis  prescribed for you? Eliquis  was prescribed for you to reduce the risk of a blood clot forming that can cause a stroke if you have a medical condition called atrial fibrillation (a type of irregular heartbeat).  What do You need to know about Eliquis  ? Take your Eliquis  TWICE DAILY - one tablet in the morning and one tablet in the evening with or without food. If you have difficulty swallowing the tablet whole please discuss with your pharmacist how to take the medication safely.  Take Eliquis  exactly as prescribed by your doctor and DO NOT stop taking Eliquis  without talking to the doctor who prescribed the medication.  Stopping may increase your risk of developing a stroke.  Refill your prescription before you run out.  After discharge, you should have regular check-up appointments with your healthcare provider that is prescribing your Eliquis .  In the future your dose may need to be changed if your kidney function or weight changes by a significant amount or as you get older.  What do you do if you miss a dose? If you miss a dose, take it as soon as you remember on the same day and resume taking twice daily.  Do not take more than one dose of ELIQUIS  at the same time to make up a missed dose.  Important Safety Information A possible side effect of Eliquis  is bleeding. You should call your healthcare provider right away if you experience any of the following: ? Bleeding from an injury or your nose that does not stop. ? Unusual colored urine (red or dark brown) or unusual colored stools (red or black). ? Unusual bruising for unknown reasons. ? A serious fall or if you hit your head (even if there is no bleeding).  Some medicines may interact with Eliquis  and might increase your risk of bleeding or clotting while on Eliquis . To help avoid this, consult your healthcare provider or pharmacist prior to using any new  prescription or non-prescription medications, including herbals, vitamins, non-steroidal anti-inflammatory drugs (NSAIDs) and supplements.  This website has more information on Eliquis  (apixaban ): [email or web address] .com/eliquis dena

## 2024-05-19 NOTE — Progress Notes (Addendum)
 Advanced Heart Failure Rounding Note  Cardiologist: None  Chief Complaint: A/C HFpEF Subjective:    Wants to go to CIR if possilble. Denies OB.  Objective:   Weight Range: 112.9 kg Body mass index is 35.71 kg/m.   Vital Signs:   Temp:  [97.7 F (36.5 C)-98.5 F (36.9 C)] 97.9 F (36.6 C) (09/23 0749) Pulse Rate:  [70-90] 88 (09/23 0819) Resp:  [15-22] 15 (09/23 0749) BP: (105-141)/(44-66) 122/45 (09/23 0749) SpO2:  [94 %-100 %] 98 % (09/23 0819) Weight:  [112.9 kg] 112.9 kg (09/23 0712) Last BM Date : 05/17/24  Weight change: Filed Weights   05/17/24 0525 05/18/24 0452 05/19/24 0712  Weight: 110.9 kg 111 kg 112.9 kg    Intake/Output:   Intake/Output Summary (Last 24 hours) at 05/19/2024 0904 Last data filed at 05/19/2024 0856 Gross per 24 hour  Intake 1147.67 ml  Output 3500 ml  Net -2352.33 ml   Physical Exam   General:   No resp difficulty Neck: no JVD.  Cor: Regular rate & rhythm.  Lungs: clear Abdomen: soft, nontender, nondistended.  Extremities: no  edema Neuro: alert & oriented x3  Telemetry   SR 80s   Labs    CBC Recent Labs    05/18/24 0324 05/18/24 1442 05/18/24 1443 05/19/24 0235  WBC 10.8*  --   --  10.0  HGB 9.1*   < > 9.9* 8.9*  HCT 29.9*   < > 29.0* 29.9*  MCV 87.9  --   --  89.8  PLT 385  --   --  392   < > = values in this interval not displayed.   Basic Metabolic Panel Recent Labs    90/77/74 0324 05/18/24 1442 05/18/24 1443 05/19/24 0235  NA 135   < > 136 139  K 3.8   < > 4.5 4.4  CL 96*  --   --  100  CO2 30  --   --  29  GLUCOSE 144*  --   --  168*  BUN 12  --   --  5*  CREATININE 1.03*  --   --  0.87  CALCIUM  8.9  --   --  9.2   < > = values in this interval not displayed.   Liver Function Tests No results for input(s): AST, ALT, ALKPHOS, BILITOT, PROT, ALBUMIN in the last 72 hours. No results for input(s): LIPASE, AMYLASE in the last 72 hours. Cardiac Enzymes No results for input(s):  CKTOTAL, CKMB, CKMBINDEX, TROPONINI in the last 72 hours.  BNP: BNP (last 3 results) Recent Labs    04/23/24 1236 04/29/24 1154 05/11/24 1601  BNP 36.0 31.0 53.0    ProBNP (last 3 results) No results for input(s): PROBNP in the last 8760 hours.   D-Dimer No results for input(s): DDIMER in the last 72 hours. Hemoglobin A1C No results for input(s): HGBA1C in the last 72 hours. Fasting Lipid Panel No results for input(s): CHOL, HDL, LDLCALC, TRIG, CHOLHDL, LDLDIRECT in the last 72 hours. Thyroid  Function Tests No results for input(s): TSH, T4TOTAL, T3FREE, THYROIDAB in the last 72 hours.  Invalid input(s): FREET3  Other results:   Imaging    CARDIAC CATHETERIZATION Result Date: 05/18/2024 1. Mildly elevated right-sided filling pressures. 2. No significant pulmonary hypertension. 3. Normal PCWP. 4. Preserved cardiac output.     Medications:     Scheduled Medications:  alum & mag hydroxide-simeth  15 mL Oral Once   budesonide  (PULMICORT ) nebulizer solution  0.5  mg Nebulization BID   busPIRone   15 mg Oral TID   Chlorhexidine  Gluconate Cloth  6 each Topical Q0600   hydrocortisone   20 mg Oral Daily   And   hydrocortisone   10 mg Oral Daily   levothyroxine   50 mcg Oral QAC breakfast   linaclotide   145 mcg Oral QAC breakfast   midodrine   5 mg Oral BID WC   pantoprazole   40 mg Oral BID   polyethylene glycol  17 g Oral BID   revefenacin   175 mcg Nebulization Daily   rosuvastatin   20 mg Oral Daily   senna-docusate  2 tablet Oral QHS   sodium chloride  flush  3 mL Intravenous Q12H   topiramate   50 mg Oral Q lunch    Infusions:  sodium chloride      heparin  2,050 Units/hr (05/19/24 0409)    PRN Medications: sodium chloride , acetaminophen  **OR** acetaminophen , acetaminophen , albuterol , alum & mag hydroxide-simeth, hydrOXYzine , Muscle Rub, nitroGLYCERIN , ondansetron  **OR** ondansetron  (ZOFRAN ) IV, ondansetron  (ZOFRAN ) IV, oxyCODONE ,  sodium chloride  flush  Patient Profile  63 year old with a history of HFpEF, bradycardia, PAF, pulmonary hypertension, adrenal insufficiency, AVN, compression fractures, and chronic pain.    Admitted to [facility] with A/C HFpEF  Assessment/Plan   # A/C HFpEF Echo LVEF 60-65% RV normal. Grade I DD.  Admitted last month with A/C HFpEF.  Diuresed well with IV lasix . ~30lbs.  RHC with stable hemodynamics.  -  Previously on lasix  20 mg po daily.  - Will start torsemide  20 mg daily.   Renal stable. She has been on midodrine  5 mg twice a day.  -Renal function stable.    #Pulmonary Sarcoid/ILD #Chronic Hypoxic Respiratory Failure, on 5 liters [place] Previously on prednisone  but stopped with multiple compression fractures and AVN.  No significant pulmonary hypertension.   #Chronic Pain #Compression Fractures  On Oxy 5 times a day.   PAF Had brief episode 05/17/24. She has been in SR since that time.  In SR today. Could consider LINQ?  On heparin  drip. Will stop.  Discussed with Dr Gardenia. Start eliquis  5 mg twice a day.    GOC: palliative care following    Length of Stay: 8  Heron Pitcock, NP  05/19/2024, 9:04 AM  Advanced Heart Failure Team Pager [phone] (M-F; 7a - 5p)  Please contact [facility] for night-coverage after hours (5p -7a ) and weekends on [email or web address]

## 2024-05-19 NOTE — Telephone Encounter (Signed)
 Patient Product/process development scientist completed.    The patient is insured through [employer]/ [employer]. Patient has Medicare and is not eligible for a copay card, but may be able to apply for patient assistance or Medicare RX Payment Plan (Patient Must reach out to their plan, if eligible for payment plan), if available.    Ran test claim for Eliquis 5 mg and the current 30 day co-pay is $47.00.   This test claim was processed through [facility]- copay amounts may vary at other pharmacies due to pharmacy/plan contracts, or as the patient moves through the different stages of their insurance plan.     Roland Earl, CPHT Pharmacy Technician III Certified Patient Advocate [facility] Patient Advocate Team Direct Number: [phone]  Fax: [phone]

## 2024-05-20 ENCOUNTER — Other Ambulatory Visit (HOSPITAL_COMMUNITY): Payer: Self-pay

## 2024-05-20 DIAGNOSIS — I5033 Acute on chronic diastolic (congestive) heart failure: Secondary | ICD-10-CM | POA: Diagnosis not present

## 2024-05-20 LAB — BASIC METABOLIC PANEL WITH GFR
Anion gap: 10 (ref 5–15)
BUN: 9 mg/dL (ref 8–23)
CO2: 29 mmol/L (ref 22–32)
Calcium: 9.2 mg/dL (ref 8.9–10.3)
Chloride: 100 mmol/L (ref 98–111)
Creatinine, Ser: 1.11 mg/dL — ABNORMAL HIGH (ref 0.44–1.00)
GFR, Estimated: 57 mL/min — ABNORMAL LOW (ref >60)
Glucose, Bld: 125 mg/dL — ABNORMAL HIGH (ref 70–99)
Potassium: 4.3 mmol/L (ref 3.5–5.1)
Sodium: 139 mmol/L (ref 135–145)

## 2024-05-20 LAB — CBC
HCT: 31.1 % — ABNORMAL LOW (ref 36.0–46.0)
Hemoglobin: 9.2 g/dL — ABNORMAL LOW (ref 12.0–15.0)
MCH: 26.4 pg (ref 26.0–34.0)
MCHC: 29.6 g/dL — ABNORMAL LOW (ref 30.0–36.0)
MCV: 89.4 fL (ref 80.0–100.0)
Platelets: 376 K/uL (ref 150–400)
RBC: 3.48 MIL/uL — ABNORMAL LOW (ref 3.87–5.11)
RDW: 17 % — ABNORMAL HIGH (ref 11.5–15.5)
WBC: 10.1 K/uL (ref 4.0–10.5)
nRBC: 0.2 % (ref 0.0–0.2)

## 2024-05-20 LAB — LIPOPROTEIN A (LPA): Lipoprotein (a): <8.4 nmol/L (ref <75.0)

## 2024-05-20 MED ORDER — TORSEMIDE 20 MG PO TABS
20.0000 mg | ORAL_TABLET | Freq: Every day | ORAL | 0 refills | Status: DC
  Filled 2024-05-20 (×2): qty 30, 30d supply, fill #0

## 2024-05-20 MED ORDER — APIXABAN 5 MG PO TABS
5.0000 mg | ORAL_TABLET | Freq: Two times a day (BID) | ORAL | 0 refills | Status: DC
  Filled 2024-05-20: qty 60, 30d supply, fill #0

## 2024-05-20 NOTE — Progress Notes (Signed)
 Inpatient Rehab Admissions Coordinator:   Per OT recommendation today,  patient was screened for CIR candidacy by Leita Kleine, MS, CCC-SLP. At this time, Pt. Appears to be a a potential candidate for CIR. I will place   order for rehab consult per protocol for full assessment. Please contact me any with questions.  Leita Kleine, MS, CCC-SLP Rehab Admissions Coordinator  [phone] (celll) [phone] (office)

## 2024-05-20 NOTE — Progress Notes (Signed)
 PROGRESS NOTE Kathleen [name]  FMW:[id number] DOB: Aug 09, 1961 DOA: 05/11/2024 PCP: Alphonsa Glendia LABOR, MD  Brief Narrative/Hospital Course: Kathleen [name] is a 63 y.o. female with PMH of heart failure, paroxysmal atrial fibrillation, sarcoidosis, chronic respiratory failure on oxygen , adrenal insufficiency, chronic pain presented secondary to shortness of breath and swelling, found to have evidence of acute on chronic heart failure, cardiology was consulted, patient was admitted and started on IV diuretics Patient is very ill with complex medical issues, with pulmonary sarcoidosis, previously on steroid and had AVN of the hip and multiple compression fracture, orthopedic felt not a candidate for surgery for AVN and has severely limited mobility, mostly bedbound except for [facility].  She has chronic hypoxic tori failure on supplemental oxygen .. 9/19> transferred [name] for advanced heart failure team evaluation 9/22: s/p RHC-no pulmonary hypertension. Volume status stabilized, patient now on oral torsemide , cardiology team is signed off   Subjective: Seen and examined  No new complaints resting comfortably Labs reviewed stable renal function CBC with chronic anemia stable weight 272 LB>>244-248>246 lb  Assessment and plan  Acute on chronic HFpEF Left small effusion/pulmonary congestion Chronic dyspnea Recent admissions for acute on chronic HFpEF Mild hypokalemia from diuresis: PTA 22 LB weight gain w/ recent echo August EF 60 to 65% indeterminate diastolic function, elevated PASP in past. Managed with diuresis, transferred to [facility] for AHF team eva l Recent pulmonary eval at [facility] requesting RHC to eval for underlying pulmonary hypertension given her sarcoid/ILD. With aggressive diuresis: About 30 pound weight loss and remains euvolemic, Lasix  discontinued 9/21> resumed torsemide  po 9/23. RHC 9/22: Mildly elevated right-sided filling pressure no significant pulmonary hypertension  normal PCWP preserved cardiac output Cardiology has signed off plan is to discharge. Net IO Since Admission: -27,341.16 mL [05/20/24 1115]  Filed Weights   05/18/24 0452 05/19/24 0712 05/20/24 0400  Weight: 111 kg 112.9 kg 112 kg   New onset PA-fib: Heparin  has been switched to Eliquis .She had history of A-fib when she was transferred to [facility] in 2017 but had converted to sinus rhythm upon transfer.  Watch for any kind of fall risk and adjust as OP   Pulmonary sarcoidosis/ILD Chronic hypoxic respiratory failure on 5 L [place]: Pulmonary status remains tenuous, continue home oxygen  setting RHC finding with normal pulmonary pressure as above.   she is followed by [name]  has been stopped, cannot tolerate methotrexate due to diarrhea hair loss, cannot tolerate CellCept due to diarrhea>advised pulmonary rehab. Last CT chest 5/26 /25> showed similar appearing chronic bilateral upper lobe paramediastinal architectural distortion, bronchiectasis and peribronchial consolidation/scaring  Normocytic anemia: Suspect from chronic illness hemoglobin stable 9 to 10 g.  Monitor  Lethargy Weakness: Mental status improved stable.Minimize opioid/pain meds continue PT OT   Goals of care Bed bound w/ dyspnea Ambulatory dysfunction Frequent hospitalizations-she had 9 hospitalizations since January this year : Overall poor prognosis, question probably has progression of the disease.  Patient was transferred for cardiac cath RHC without significant pulmonary hypertension.Palliative care continues to follow along. She is at full code but I am very concerned , overall prognosis does not appear bright  OSA: Not using CPAP.  Chronic pain syndrome Anxiety/depression Pain controlled mood stable continue home meds including -BuSpar , Topamax , oxycodone   Adrenal insufficiency Hypothyroidism Chronic hypotension: Continue her synthroid , midodrine  and Cortef  bid.  Received IV Solu-Medrol  this  admission  HLD Continue statin  Constipation: continue Linzess , MiraLAX .  Last bowel movement 9/20  Class I Obesity w/ Body mass  index is 35.43 kg/m.: Will benefit with PCP follow-up, weight loss,healthy lifestyle  Mobility: PT Orders: Active PT Follow up Rec: Home Health Pt (Would Benefit From <3 Hours Per Day But Refusing)05/19/2024 1339   DVT prophylaxis:  Code Status:   Code Status: Full Code Family Communication: plan of care discussed with patient/husband at bedside has been updated. Patient status is: Remains hospitalized because of severity of illness Level of care: Telemetry Cardiac   Dispo: The patient is from: home            Anticipated disposition: Home with home health >Patient unable to tolerate CIR level of care> but patient is requesting to go to CIR PT OT eval requested   Objective: Vitals last 24 hrs: Vitals:   05/20/24 0400 05/20/24 0724 05/20/24 0802 05/20/24 1106  BP: 119/61 107/87  (!) 110/47  Pulse: 71 75 76   Resp: 19 20 19 20   Temp: 98.6 F (37 C) 99.1 F (37.3 C)  98.5 F (36.9 C)  TempSrc: Oral Oral  Oral  SpO2: 100% 98% 98%   Weight: 112 kg     Height:        Physical Examination: General exam: AAOX3 HEENT:Oral mucosa moist, Ear/Nose WNL grossly Respiratory system: B/L clear BS, wheezing, use of accessory muscle Cardiovascular system: S1 & S2 +, No JVD. Gastrointestinal system: Abdomen soft,NT,ND, BS+ Nervous System: Bit sleepy but able to wake up and interact, moving extremities Extremities: extremities warm, leg edema  minimal Skin: No rashes,no icterus. MSK: Normal muscle bulk,tone, power   Medications reviewed:  Scheduled Meds:  alum & mag hydroxide-simeth  15 mL Oral Once   apixaban   5 mg Oral BID   budesonide  (PULMICORT ) nebulizer solution  0.5 mg Nebulization BID   busPIRone   15 mg Oral TID   Chlorhexidine  Gluconate Cloth  6 each Topical Q0600   feeding supplement  1 Container Oral BID BM   hydrocortisone   20 mg Oral  Daily   And   hydrocortisone   10 mg Oral Daily   levothyroxine   50 mcg Oral QAC breakfast   linaclotide   145 mcg Oral QAC breakfast   midodrine   5 mg Oral BID WC   pantoprazole   40 mg Oral BID   polyethylene glycol  17 g Oral BID   revefenacin   175 mcg Nebulization Daily   rosuvastatin   20 mg Oral Daily   senna-docusate  2 tablet Oral QHS   sodium chloride  flush  3 mL Intravenous Q12H   topiramate   50 mg Oral Q lunch   torsemide   20 mg Oral Daily   Continuous Infusions:   Diet: Diet Order             Diet Heart Room service appropriate? Yes; Fluid consistency: Thin  Diet effective now                    Data Reviewed: I have personally reviewed following labs and imaging studies ( see epic result tab) CBC: Recent Labs  Lab 05/13/24 1847 05/15/24 0753 05/16/24 0303 05/17/24 0223 05/18/24 0324 05/18/24 1442 05/18/24 1443 05/19/24 0235 05/20/24 0347  WBC 11.9*   < > 13.6* 12.1* 10.8*  --   --  10.0 10.1  NEUTROABS 8.8*  --   --   --   --   --   --   --   --   HGB 8.7*   < > 10.4* 9.7* 9.1* 9.9* 9.9* 8.9* 9.2*  HCT 30.0*   < >  34.8* 31.6* 29.9* 29.0* 29.0* 29.9* 31.1*  MCV 92.9   < > 88.5 87.3 87.9  --   --  89.8 89.4  PLT 494*   < > 500* 427* 385  --   --  392 376   < > = values in this interval not displayed.   CMP: Recent Labs  Lab 05/14/24 0410 05/15/24 0244 05/15/24 0753 05/16/24 0303 05/17/24 0223 05/18/24 0324 05/18/24 1442 05/18/24 1443 05/19/24 0235 05/20/24 0347  NA 140 144 143 142 138 135 135 136 139 139   K 4.3 3.8 3.8 3.4* 3.6 3.8 4.6 4.5 4.4 4.3  CL 98 96* 99 94* 95* 96*  --   --  100 100  CO2 31 33* 32 31 32 30  --   --  29 29  GLUCOSE 272* 116* 109* 121* 146* 144*  --   --  168* 125*  BUN 20 18 19 21 19 12   --   --  5* 9  CREATININE 0.86 0.69 0.81 0.93 1.08* 1.03*  --   --  0.87 1.11*  CALCIUM  8.5* 9.0 9.0 9.1 9.0 8.9  --   --  9.2 9.2  MG 2.3 2.4 2.3  --   --   --   --   --   --   --    GFR: Estimated Creatinine Clearance: 72.2  mL/min (A) (by C-G formula based on SCr of 1.11 mg/dL (H)). No results for input(s): AST, ALT, ALKPHOS, BILITOT, PROT, ALBUMIN in the last 168 hours. No results for input(s): LIPASE, AMYLASE in the last 168 hours. No results for input(s): AMMONIA  in the last 168 hours. Coagulation Profile: No results for input(s): INR, PROTIME in the last 168 hours. Unresulted Labs (From admission, onward)    None      Antimicrobials/Microbiology: Anti-infectives (From admission, onward)    None         Component Value Date/Time   SDES  04/23/2024 1229    URINE, CLEAN CATCH Performed at [facility], [address], [place], [place] [address]    [facility]  04/23/2024 1229    NONE Performed at [facility], [address] [place], [place] [address]    CULT 80,000 COLONIES/mL PROVIDENCIA STUARTII (A) 04/23/2024 1229   REPTSTATUS 04/25/2024 FINAL 04/23/2024 1229    Procedures: Procedure(s) (LRB): RIGHT HEART CATH (N/A)   Mennie LAMY, MD [facility] 05/20/2024, 11:44 AM

## 2024-05-20 NOTE — Care Management Important Message (Signed)
 Important Message  Patient Details  Name: Kathleen [name] MRN: [id number] Date of Birth: 06-11-1961   Important Message Given:  Yes - Medicare IM     Vonzell Arrie Sharps 05/20/2024, 11:57 AM

## 2024-05-20 NOTE — Progress Notes (Signed)
 Occupational Therapy Treatment Patient Details Name: Kathleen [name] MRN: [id number] DOB: Mar 24, 1961 Today's Date: 05/20/2024   History of present illness Kathleen [name] 63 yo female presents with acute on chronic HFpEF now with paroxysmal AF RVR episode 9/20. PMH of heart failure, paroxysmal atrial fibrillation, sarcoidosis, chronic respiratory failure on oxygen , adrenal insufficiency, chronic pain.   OT comments  Pt progressing well towards goals. Pt more alert and aroused compared to previous OT session, reporting I am willing to work to get into CIR. Significant improved activity tolerance and standing tolerance. Progressed to complete simulated LB dressing and toileting with min assist to CGA. Progressed functional mobility with RW. Pt and spouse very motivated for CIR. OT educated pt that she will need to show continuous improvement and tolerance for intensive therapy. OT encouraged pt continued participation with mobility specialist outside of therapy sessions to continue to progress. Considering pt's motivation and functional progress will place rehab consult for CIR. Will continue to follow acutely.       If plan is discharge home, recommend the following:  A lot of help with walking and/or transfers;A lot of help with bathing/dressing/bathroom;Assistance with cooking/housework;Assist for transportation;Help with stairs or ramp for entrance   Equipment Recommendations  None recommended by OT    Recommendations for Other Services Rehab consult    Precautions / Restrictions Precautions Precautions: Fall Recall of Precautions/Restrictions: Intact Precaution/Restrictions Comments: Back for comfort Restrictions Weight Bearing Restrictions Per Provider Order: No       Mobility Bed Mobility Overal bed mobility: Needs Assistance Bed Mobility: Rolling, Sidelying to Sit, Sit to Sidelying Rolling: Supervision, Used rails Sidelying to sit: Supervision, Used rails     Sit to  sidelying: Supervision, Used rails General bed mobility comments: S for safety, HOB elevated and use of rails but good log rolling technique    Transfers Overall transfer level: Needs assistance Equipment used: Rolling walker (2 wheels) Transfers: Sit to/from Stand Sit to Stand: Contact guard assist           General transfer comment: CGA, good hand placement for RW. Tolerating ~25ft x2 in room     Balance Overall balance assessment: Needs assistance Sitting-balance support: Feet supported, No upper extremity supported       Standing balance support: Bilateral upper extremity supported, During functional activity, Reliant on assistive device for balance Standing balance-Leahy Scale: Poor Standing balance comment: Reliant on BUE       ADL either performed or assessed with clinical judgement   ADL Overall ADL's : Needs assistance/impaired     Lower Body Dressing: Minimal assistance;Sit to/from stand Lower Body Dressing Details (indicate cue type and reason): Simulated in room, assist to thread legs Toilet Transfer: Contact guard assist;Rolling walker (2 wheels);Ambulation Toilet Transfer Details (indicate cue type and reason): Simulated in room Toileting- Clothing Manipulation and Hygiene: Contact guard assist;Sit to/from stand Toileting - Clothing Manipulation Details (indicate cue type and reason): Simulated in room     Functional mobility during ADLs: Contact guard assist;Rolling walker (2 wheels) General ADL Comments: Pt with improved arousal and tolerance, still limited with poor balance    Extremity/Trunk Assessment Upper Extremity Assessment Upper Extremity Assessment: Generalized weakness   Lower Extremity Assessment Lower Extremity Assessment: Defer to PT evaluation        Vision   Vision Assessment?: No apparent visual deficits         Communication Communication Communication: No apparent difficulties   Cognition Arousal: Alert Behavior During  Therapy: WFL for tasks assessed/performed, Anxious  Cognition: No apparent impairments     Following commands: Intact        Cueing   Cueing Techniques: Verbal cues  Exercises Exercises: Other exercises Other Exercises Other Exercises: x5 STS       General Comments Spouse present and supportive    Pertinent Vitals/ Pain       Pain Assessment Pain Assessment: Faces Faces Pain Scale: Hurts little more Pain Location: back Pain Descriptors / Indicators: Discomfort, Grimacing Pain Intervention(s): Monitored during session   Frequency  Min 1X/week        Progress Toward Goals  OT Goals(current goals can now be found in the care plan section)  Progress towards OT goals: Progressing toward goals  Acute Rehab OT Goals Patient Stated Goal: To go to rehab OT Goal Formulation: With patient Time For Goal Achievement: 05/26/24 Potential to Achieve Goals: Fair ADL Goals Pt Will Perform Grooming: with modified independence;standing Pt Will Perform Upper Body Bathing: with modified independence;with set-up Pt Will Perform Upper Body Dressing: with modified independence;with set-up Pt Will Perform Lower Body Dressing: with min assist;sitting/lateral leans Pt Will Transfer to Toilet: with supervision;ambulating Pt Will Perform Toileting - Clothing Manipulation and hygiene: with supervision;sit to/from stand;sitting/lateral leans Pt/caregiver will Perform Home Exercise Program: Increased strength;Both right and left upper extremity;Independently  Plan         AM-PAC OT 6 Clicks Daily Activity     Outcome Measure   Help from another person eating meals?: None Help from another person taking care of personal grooming?: A Little Help from another person toileting, which includes using toliet, bedpan, or urinal?: A Little Help from another person bathing (including washing, rinsing, drying)?: A Little Help from another person to put on and taking off regular upper body  clothing?: A Little Help from another person to put on and taking off regular lower body clothing?: A Little 6 Click Score: 19    End of Session Equipment Utilized During Treatment: Rolling walker (2 wheels);Oxygen   OT Visit Diagnosis: Unsteadiness on feet (R26.81);Other abnormalities of gait and mobility (R26.89);Muscle weakness (generalized) (M62.81)   Activity Tolerance Patient tolerated treatment well   Patient Left in bed;with call bell/phone within reach;with family/visitor present   Nurse Communication Mobility status        Time: 1203-1223 OT Time Calculation (min): 20 min  Charges: OT General Charges $OT Visit: 1 Visit OT Treatments $Self Care/Home Management : 8-22 mins  Adrianne BROCKS, OT  Acute Rehabilitation Services Office [phone] Secure chat preferred   Adrianne GORMAN Savers 05/20/2024, 1:57 PM

## 2024-05-20 NOTE — Progress Notes (Signed)
 Inpatient Rehab Admissions Coordinator:   Per [facility] request, patient was screened for CIR candidacy by Leita Kleine, MS, CCC-SLP. At this time, Pt. Does not appear to be able to tolerate the intensity of CIR; however,   Pt. may have potential to progress to becoming a potential CIR candidate, so CIR admissions team will follow and monitor for progress and participation with therapies and place consult order if Pt. appears to be an appropriate candidate. Please contact me with any questions.  If ready for d/c, will need SNF.  Leita Kleine, MS, CCC-SLP Rehab Admissions Coordinator  [phone] (celll) [phone] (office)

## 2024-05-21 ENCOUNTER — Other Ambulatory Visit (HOSPITAL_COMMUNITY): Payer: Self-pay

## 2024-05-21 DIAGNOSIS — I5033 Acute on chronic diastolic (congestive) heart failure: Secondary | ICD-10-CM | POA: Diagnosis not present

## 2024-05-21 NOTE — Plan of Care (Signed)
  Problem: Clinical Measurements: Goal: Ability to maintain clinical measurements within normal limits will improve Outcome: Progressing Goal: Diagnostic test results will improve Outcome: Progressing   Problem: Activity: Goal: Risk for activity intolerance will decrease Outcome: Progressing   Problem: Nutrition: Goal: Adequate nutrition will be maintained Outcome: Progressing   Problem: Pain Managment: Goal: General experience of comfort will improve and/or be controlled Outcome: Progressing   Problem: Safety: Goal: Ability to remain free from injury will improve Outcome: Progressing

## 2024-05-21 NOTE — Progress Notes (Signed)
 IP rehab admissions - I met with patient and her husband at the bedside.  They would like inpatient rehab admission prior to home with spouse.  I have faxed request to [employer] insurance.  My partner will follow up once we hear back from insurance carrier.  [phone]

## 2024-05-21 NOTE — Progress Notes (Signed)
 PROGRESS NOTE Kathleen [name]  FMW:[id number] DOB: 1961-08-04 DOA: 05/11/2024 PCP: Alphonsa Glendia LABOR, MD  Brief Narrative/Hospital Course: Kathleen [name] is a 63 y.o. female with PMH of heart failure, paroxysmal atrial fibrillation, sarcoidosis, chronic respiratory failure on oxygen , adrenal insufficiency, chronic pain presented secondary to shortness of breath and swelling, found to have evidence of acute on chronic heart failure, cardiology was consulted, patient was admitted and started on IV diuretics Patient is very ill with complex medical issues, with pulmonary sarcoidosis, previously on steroid and had AVN of the hip and multiple compression fracture, orthopedic felt not a candidate for surgery for AVN and has severely limited mobility, mostly bedbound except for [facility].  She has chronic hypoxic tori failure on supplemental oxygen .. 9/19> transferred from [facility] to [facility] for advanced heart failure team evaluation 9/22: s/p RHC-no pulmonary hypertension. Volume status stabilized, patient now on oral torsemide , cardiology team signed off Patient and family interested to pursue CIR upon discharge  Subjective: Seen and examined No new complaints Overnight no acute events, vital stable Weight at 249 pounds slightly up weight 272 LB>>244-248>246>249 lb She is asking to be sent to CIR  Assessment and plan  Acute on chronic HFpEF Left small effusion/pulmonary congestion Chronic dyspnea Recent admissions for acute on chronic HFpEF Mild hypokalemia from diuresis: PTA 22 LB weight gain w/ recent echo August EF 60 to 65% indeterminate diastolic function, elevated PASP in past. Managed with diuresis, transferred to [facility] for AHF team eva l Recent pulmonary eval at [facility] requesting RHC to eval for underlying pulmonary hypertension given her sarcoid/ILD. With aggressive diuresis: About 30 pound weight loss and remains euvolemic, Lasix  discontinued 9/21> resumed torsemide  po 9/23. RHC 9/22: Mildly  elevated right-sided filling pressure no significant pulmonary hypertension normal PCWP preserved cardiac output Cardiology has signed off plan is to discharge. Net IO Since Admission: -29,068.16 mL [05/21/24 1104]  Filed Weights   05/19/24 0712 05/20/24 0400 05/21/24 0426  Weight: 112.9 kg 112 kg 113.1 kg   New onset PA-fib: Heparin  has been switched to Eliquis .She had history of A-fib when she was transferred to [facility] in 2017 but had converted to sinus rhythm upon transfer.  Watch for any kind of fall risk and adjust as OP   Pulmonary sarcoidosis/ILD Chronic hypoxic respiratory failure on 5 L [place]: Pulmonary status remains tenuous, continue home oxygen  setting RHC finding with normal pulmonary pressure as above.   she is followed by [name]  has been stopped, cannot tolerate methotrexate due to diarrhea hair loss, cannot tolerate CellCept due to diarrhea>advised pulmonary rehab. Last CT chest 5/26 /25> showed similar appearing chronic bilateral upper lobe paramediastinal architectural distortion, bronchiectasis and peribronchial consolidation/scaring  Normocytic anemia: Suspect from chronic illness hemoglobin stable 9 to 10 g.  Monitor  Lethargy Weakness: Mental status improved stable.Minimize opioid/pain meds continue PT OT   Goals of care Bed bound w/ dyspnea Ambulatory dysfunction Frequent hospitalizations-she had 9 hospitalizations since January this year : Overall poor prognosis, question probably has progression of the disease.  Patient was transferred for cardiac cath RHC without significant pulmonary hypertension.Palliative care continues to follow along. She is at full code but I am very concerned , overall prognosis does not appear bright  OSA: Not using CPAP.  Chronic pain syndrome Anxiety/depression Pain controlled mood stable continue home meds including -BuSpar , Topamax , oxycodone   Adrenal insufficiency Hypothyroidism Chronic hypotension: Continue her  synthroid , midodrine  and Cortef  bid.  Received IV Solu-Medrol  this admission  HLD Continue statin  Constipation: continue  Linzess , MiraLAX .  Last bowel movement 9/20  Class I Obesity w/ Body mass index is 35.78 kg/m.: Will benefit with PCP follow-up, weight loss,healthy lifestyle  Mobility: PT Orders: Active PT Follow up Rec: Home Health Pt (Would Benefit From <3 Hours Per Day But Refusing)05/19/2024 1339   DVT prophylaxis:  Code Status:   Code Status: Full Code Family Communication: plan of care discussed with patient/husband at bedside has been updated. Patient status is: Remains hospitalized because of severity of illness Level of care: Telemetry Cardiac   Dispo: The patient is from: home            Anticipated disposition: Patient and family wants to pursue CIR. medically stable for discharge  Objective: Vitals last 24 hrs: Vitals:   05/21/24 0038 05/21/24 0426 05/21/24 0806 05/21/24 0820  BP: (!) 103/45 (!) 119/52  115/66  Pulse: 82 82 90 90  Resp: 17 19 20 17   Temp: 98.6 F (37 C) 98.7 F (37.1 C)  98 F (36.7 C)  TempSrc: Oral Oral  Oral  SpO2: 97% 98%  95%  Weight:  113.1 kg    Height:        Physical Examination: General exam: AAOX3, obese chronically sick looking on nasal cannula HEENT:Oral mucosa moist, Ear/Nose WNL grossly Respiratory system: B/L diminished at both bases otherwise clear,no wheezing, use of accessory muscle Cardiovascular system: S1 & S2 +, No JVD. Gastrointestinal system: Abdomen soft,NT,ND, BS+ Nervous System: Bit sleepy but able to wake up and interact, moving extremities Extremities: extremities warm, leg edema  minimal Skin: No rashes,no icterus. MSK: Normal muscle bulk,tone, power   Medications reviewed:  Scheduled Meds:  alum & mag hydroxide-simeth  15 mL Oral Once   apixaban   5 mg Oral BID   budesonide  (PULMICORT ) nebulizer solution  0.5 mg Nebulization BID   busPIRone   15 mg Oral TID   Chlorhexidine  Gluconate Cloth  6 each  Topical Q0600   feeding supplement  1 Container Oral BID BM   hydrocortisone   20 mg Oral Daily   And   hydrocortisone   10 mg Oral Daily   levothyroxine   50 mcg Oral QAC breakfast   linaclotide   145 mcg Oral QAC breakfast   midodrine   5 mg Oral BID WC   pantoprazole   40 mg Oral BID   polyethylene glycol  17 g Oral BID   revefenacin   175 mcg Nebulization Daily   rosuvastatin   20 mg Oral Daily   senna-docusate  2 tablet Oral QHS   sodium chloride  flush  3 mL Intravenous Q12H   topiramate   50 mg Oral Q lunch   torsemide   20 mg Oral Daily   Continuous Infusions:   Diet: Diet Order             Diet - low sodium heart healthy           Diet Heart Room service appropriate? Yes; Fluid consistency: Thin  Diet effective now                    Data Reviewed: I have personally reviewed following labs and imaging studies ( see epic result tab) CBC: Recent Labs  Lab 05/16/24 0303 05/17/24 0223 05/18/24 0324 05/18/24 1442 05/18/24 1443 05/19/24 0235 05/20/24 0347  WBC 13.6* 12.1* 10.8*  --   --  10.0 10.1  HGB 10.4* 9.7* 9.1* 9.9* 9.9* 8.9* 9.2*  HCT 34.8* 31.6* 29.9* 29.0* 29.0* 29.9* 31.1*  MCV 88.5 87.3 87.9  --   --  89.8 89.4  PLT 500* 427* 385  --   --  392 376   CMP: Recent Labs  Lab 05/15/24 0244 05/15/24 0753 05/16/24 0303 05/17/24 0223 05/18/24 0324 05/18/24 1442 05/18/24 1443 05/19/24 0235 05/20/24 0347  NA 144 143 142 138 135 135 136 139 139  K 3.8 3.8 3.4* 3.6 3.8 4.6 4.5 4.4 4.3  CL 96* 99 94* 95* 96*  --   --  100 100  CO2 33* 32 31 32 30  --   --  29 29  GLUCOSE 116* 109* 121* 146* 144*  --   --  168* 125*  BUN 18 19 21 19 12   --   --  5* 9  CREATININE 0.69 0.81 0.93 1.08* 1.03*  --   --  0.87 1.11*  CALCIUM  9.0 9.0 9.1 9.0 8.9  --   --  9.2 9.2  MG 2.4 2.3  --   --   --   --   --   --   --    GFR: Estimated Creatinine Clearance: 72.5 mL/min (A) (by C-G formula based on SCr of 1.11 mg/dL (H)). No results for input(s): AST, ALT, ALKPHOS,  BILITOT, PROT, ALBUMIN in the last 168 hours. No results for input(s): LIPASE, AMYLASE in the last 168 hours. No results for input(s): AMMONIA  in the last 168 hours. Coagulation Profile: No results for input(s): INR, PROTIME in the last 168 hours. Unresulted Labs (From admission, onward)    None      Antimicrobials/Microbiology: Anti-infectives (From admission, onward)    None         Component Value Date/Time   SDES  04/23/2024 1229    URINE, CLEAN CATCH Performed at [facility], [address], [place], [place] [address]    [facility]  04/23/2024 1229    NONE Performed at [facility], [address], [place], [place] [address]    CULT 80,000 COLONIES/mL PROVIDENCIA STUARTII (A) 04/23/2024 1229   REPTSTATUS 04/25/2024 FINAL 04/23/2024 1229    Procedures: Procedure(s) (LRB): RIGHT HEART CATH (N/A)   Mennie LAMY, MD [facility] 05/21/2024, 11:05 AM

## 2024-05-21 NOTE — PMR Pre-admission (Shared)
 PMR Admission Coordinator Pre-Admission Assessment  Patient: Kathleen [name] is an 63 y.o., female MRN: [id number] DOB: 03-18-1961 Height: 5' 10 (177.8 cm) Weight: 113.1 kg  Insurance Information HMO: Yes    PPO:       PCP:       IPA:       80/20:       OTHER:  [employer] PRIMARY: [employer]      Policy#: [id number]      Subscriber: patient CM Name: ***      Phone#: [phone] Opt 3     Fax#: [phone] Pre-Cert#: ***      Employer: Disabled Benefits:  Phone #: [phone] option 1     Name: spoke with Sam Eff. Date: 08/28/23     Deduct: $0      Out of Pocket Max: $3400 (met $3400)      Life Max: n/a CIR: $195/day for days 1-6      SNF: $0 for days 1-20; $214 for days 21-100 Outpatient: med nec     Co-Pay: $5/visit co-pay Home Health: 100%      Co-Pay: none DME: 80%     Co-Pay: 20% Providers: in network  SECONDARY:       Policy#:      Phone#:   Artist:       Phone#:   The Data processing manager" for patients in Inpatient Rehabilitation Facilities with attached "Privacy Act Statement-Health Care Records" was provided and verbally reviewed with: Patient  Emergency Contact Information Contact Information     Name Relation Home Work Mobile   [place] Spouse   [phone]   Isadora Knee Daughter   [phone]      Other Contacts     Name Relation Home Work Mobile   Lee,Justine Mother   [phone]       Current Medical History  Patient Admitting Diagnosis: Debility, heart failure  History of Present Illness: A 63 y.o. female with PMH of heart failure, paroxysmal atrial fibrillation, sarcoidosis, chronic respiratory failure on oxygen , adrenal insufficiency, chronic pain presented secondary to shortness of breath and swelling, found to have evidence of acute on chronic heart failure, cardiology was consulted, patient was admitted and started on IV diuretics.  Patient is very ill with complex medical issues, with pulmonary  sarcoidosis, previously on steroid and had AVN of the hip and multiple compression fracture, orthopedic felt not a candidate for surgery for AVN and has severely limited mobility, mostly bedbound except for BSC.  She has chronic hypoxic tori failure on supplemental oxygen .  Patient was initially seen at [facility] on 05/11/24 and then transferred to [facility] on 05/15/24 for advanced heart failure team evaluation.  On 05/18/24 s/p RHC-no pulmonary hypertension.  Volume status stabilized, patient now on oral torsemide , cardiology team signed off.  PT/OT evaluations completed with recommendations for acute inpatient rehab admission.  Patient's medical record from [facility] has been reviewed by the rehabilitation admission coordinator and physician.  Past Medical History  Past Medical History:  Diagnosis Date   Adrenal insufficiency    Anemia    Anxiety    Avascular bone necrosis (HCC)    Breast fibrocystic disorder    Chronic pain    Encounter for long-term opiate analgesic use 02/02/2020   Patient has chronic pain with femoral head avascular necrosis.  Is under pain contract   Fibromyalgia    Gastroesophageal reflux disease    Hypothyroidism    Mitral valve prolapse  Orthostatic hypotension    Peripheral neuropathy    PONV (postoperative nausea and vomiting)    Restrictive lung disease    Moderate to severe   Sarcoidosis    Biopsy proven - [facility]   Sarcoidosis    SOB (shortness of breath)    chronic    Has the patient had major surgery during 100 days prior to admission? No  Family History   family history includes Bipolar disorder in her daughter; Breast cancer in her mother; Cardiomyopathy in her mother; Heart attack in her maternal grandfather, maternal grandmother, and paternal grandmother; Heart disease in her maternal aunt; Neurofibromatosis in her cousin; Prostate cancer in her father.  Current Medications  Current Facility-Administered  Medications:    acetaminophen  (TYLENOL ) tablet 650 mg, 650 mg, Oral, Q6H PRN, 650 mg at 05/20/24 0759 **OR** acetaminophen  (TYLENOL ) suppository 650 mg, 650 mg, Rectal, Q6H PRN, Emokpae, Ejiroghene E, MD   acetaminophen  (TYLENOL ) tablet 650 mg, 650 mg, Oral, Q4H PRN, McLean, Dalton S, MD   albuterol  (PROVENTIL ) (2.5 MG/3ML) 0.083% nebulizer solution 2.5 mg, 2.5 mg, Nebulization, Q4H PRN, Emokpae, Ejiroghene E, MD, 2.5 mg at 05/13/24 2116   alum & mag hydroxide-simeth (MAALOX/MYLANTA) 200-200-20 MG/5ML suspension 15 mL, 15 mL, Oral, Q6H PRN, Briana Elgin LABOR, MD, 15 mL at 05/20/24 1948   alum & mag hydroxide-simeth (MAALOX/MYLANTA) 200-200-20 MG/5ML suspension 15 mL, 15 mL, Oral, Once, Kc, Ramesh, MD   apixaban  (ELIQUIS ) tablet 5 mg, 5 mg, Oral, BID, Clegg, Amy D, NP, 5 mg at 05/21/24 0850   budesonide  (PULMICORT ) nebulizer solution 0.5 mg, 0.5 mg, Nebulization, BID, Alekh, Kshitiz, MD, 0.5 mg at 05/21/24 9193   busPIRone  (BUSPAR ) tablet 15 mg, 15 mg, Oral, TID, Emokpae, Ejiroghene E, MD, 15 mg at 05/21/24 1618   Chlorhexidine  Gluconate Cloth 2 % PADS 6 each, 6 each, Topical, Q0600, Briana Elgin LABOR, MD, 6 each at 05/21/24 0550   feeding supplement (BOOST / RESOURCE BREEZE) liquid 1 Container, 1 Container, Oral, BID BM, Kc, Ramesh, MD, 1 Container at 05/21/24 0850   hydrocortisone  (CORTEF ) tablet 20 mg, 20 mg, Oral, Daily, 20 mg at 05/21/24 0850 **AND** hydrocortisone  (CORTEF ) tablet 10 mg, 10 mg, Oral, Daily, Nazari, Walid A, RPH, 10 mg at 05/21/24 1148   hydrOXYzine  (ATARAX ) tablet 25 mg, 25 mg, Oral, TID PRN, Briana Elgin LABOR, MD, 25 mg at 05/20/24 0546   levothyroxine  (SYNTHROID ) tablet 50 mcg, 50 mcg, Oral, QAC breakfast, Emokpae, Ejiroghene E, MD, 50 mcg at 05/21/24 0549   linaclotide  (LINZESS ) capsule 145 mcg, 145 mcg, Oral, QAC breakfast, Emokpae, Ejiroghene E, MD, 145 mcg at 05/21/24 0850   midodrine  (PROAMATINE ) tablet 5 mg, 5 mg, Oral, BID WC, Emokpae, Ejiroghene E, MD, 5 mg at 05/21/24 1619    Muscle Rub CREA, , Topical, PRN, Cheryle Page, MD, Given at 05/17/24 1841   nitroGLYCERIN  (NITROSTAT ) SL tablet 0.4 mg, 0.4 mg, Sublingual, Q5 min PRN, Mansy, Jan A, MD   ondansetron  (ZOFRAN ) tablet 4 mg, 4 mg, Oral, Q6H PRN **OR** ondansetron  (ZOFRAN ) injection 4 mg, 4 mg, Intravenous, Q6H PRN, Emokpae, Ejiroghene E, MD, 4 mg at 05/21/24 0850   ondansetron  (ZOFRAN ) injection 4 mg, 4 mg, Intravenous, Q6H PRN, Rolan Ezra RAMAN, MD   oxyCODONE  (Oxy IR/ROXICODONE ) immediate release tablet 15 mg, 15 mg, Oral, Q4H PRN, Emokpae, Ejiroghene E, MD, 15 mg at 05/21/24 1619   pantoprazole  (PROTONIX ) EC tablet 40 mg, 40 mg, Oral, BID, Emokpae, Ejiroghene E, MD, 40 mg at 05/21/24 0850   polyethylene glycol (MIRALAX  /  GLYCOLAX ) packet 17 g, 17 g, Oral, BID, Emokpae, Ejiroghene E, MD, 17 g at 05/20/24 2125   revefenacin  (YUPELRI ) nebulizer solution 175 mcg, 175 mcg, Nebulization, Daily, Briana Elgin LABOR, MD, 175 mcg at 05/21/24 9193   rosuvastatin  (CRESTOR ) tablet 20 mg, 20 mg, Oral, Daily, Emokpae, Ejiroghene E, MD, 20 mg at 05/21/24 0850   senna-docusate (Senokot-S) tablet 2 tablet, 2 tablet, Oral, QHS, Emokpae, Ejiroghene E, MD, 2 tablet at 05/20/24 2124   sodium chloride  flush (NS) 0.9 % injection 3 mL, 3 mL, Intravenous, Q12H, Rolan Ezra RAMAN, MD, 3 mL at 05/21/24 0851   sodium chloride  flush (NS) 0.9 % injection 3 mL, 3 mL, Intravenous, PRN, Rolan Ezra RAMAN, MD   topiramate  (TOPAMAX ) tablet 50 mg, 50 mg, Oral, Q lunch, Emokpae, Ejiroghene E, MD, 50 mg at 05/21/24 1148   torsemide  (DEMADEX ) tablet 20 mg, 20 mg, Oral, Daily, Clegg, Amy D, NP, 20 mg at 05/21/24 0850  Patients Current Diet:  Diet Order             Diet - low sodium heart healthy           Diet Heart Room service appropriate? Yes; Fluid consistency: Thin  Diet effective now                   Precautions / Restrictions Precautions Precautions: Fall Precaution/Restrictions Comments: Back for comfort Restrictions Weight Bearing  Restrictions Per Provider Order: No   Has the patient had 2 or more falls or a fall with injury in the past year? No  Prior Activity Level Community (5-7x/wk): Went out daily, was driving.  Prior Functional Level Self Care: Did the patient need help bathing, dressing, using the toilet or eating? Needed some help  Indoor Mobility: Did the patient need assistance with walking from room to room (with or without device)? Needed some help  Stairs: Did the patient need assistance with internal or external stairs (with or without device)? Needed some help  Functional Cognition: Did the patient need help planning regular tasks such as shopping or remembering to take medications? Needed some help  Patient Information Are you of Hispanic, Latino/a,or Spanish origin?: A. No, not of Hispanic, Latino/a, or Spanish origin What is your race?: A. White Do you need or want an interpreter to communicate with a doctor or health care staff?: 0. No Patient information obtained via proxy : No  Patient's Response To:  Health Literacy and Transportation Is the patient able to respond to health literacy and transportation needs?: Yes Health Literacy - How often do you need to have someone help you when you read instructions, pamphlets, or other written material from your doctor or pharmacy?: Never In the past 12 months, has lack of transportation kept you from medical appointments or from getting medications?: No In the past 12 months, has lack of transportation kept you from meetings, work, or from getting things needed for daily living?: No Health Literacy and Transportation obtained via proxy: No  Journalist, newspaper / Equipment Home Equipment: Agricultural consultant (2 wheels), Art gallery manager, Information systems manager, Higher education careers adviser held shower head, BSC/3in1  Prior Device Use: Indicate devices/aids used by the patient prior to current illness, exacerbation or injury? Walker  Current Functional Level Cognition  Orientation  Level: Oriented X4    Extremity Assessment (includes Sensation/Coordination)  Upper Extremity Assessment: Generalized weakness  Lower Extremity Assessment: Defer to PT evaluation    ADLs  Overall ADL's : Needs assistance/impaired Eating/Feeding: Modified independent, Set up, Sitting Grooming:  Contact guard assist, Minimal assistance, Sitting Upper Body Bathing: Contact guard assist, Minimal assistance, Sitting Lower Body Bathing: Maximal assistance, Sitting/lateral leans, Total assistance Upper Body Dressing : Contact guard assist, Minimal assistance, Sitting Lower Body Dressing: Minimal assistance, Sit to/from stand Lower Body Dressing Details (indicate cue type and reason): Simulated in room, assist to thread legs Toilet Transfer: Contact guard assist, Rolling walker (2 wheels), Ambulation Toilet Transfer Details (indicate cue type and reason): Simulated in room Toileting- Clothing Manipulation and Hygiene: Contact guard assist, Sit to/from stand Toileting - Clothing Manipulation Details (indicate cue type and reason): Simulated in room Tub/Shower Transfer Details (indicate cue type and reason): Unable to attempt or simulate today per pt's report she could not stand until fluid is removed from her legs. Functional mobility during ADLs: Contact guard assist, Rolling walker (2 wheels) General ADL Comments: Pt with improved arousal and tolerance, still limited with poor balance    Mobility  Overal bed mobility: Needs Assistance Bed Mobility: Rolling, Sidelying to Sit, Sit to Sidelying Rolling: Supervision, Used rails Sidelying to sit: Supervision, Used rails, HOB elevated Supine to sit: Supervision, HOB elevated Sit to supine: Supervision, HOB elevated Sit to sidelying: Supervision, Used rails General bed mobility comments: S for safety, HOB elevated and use of rails but good log rolling technique, PTA assist with line mgmt    Transfers  Overall transfer level: Needs  assistance Equipment used: Rolling walker (2 wheels) Transfers: Sit to/from Stand Sit to Stand: Contact guard assist Bed to/from chair/wheelchair/BSC transfer type:: Step pivot Step pivot transfers: Contact guard assist, +2 safety/equipment General transfer comment: from EOB and chair heights, cues for safe UE placement needed. 3 reps from chair, x1 from EOB    Ambulation / Gait / Stairs / Wheelchair Mobility  Ambulation/Gait Gait Distance (Feet): 25 Feet (6ft, seated break, 36ft x2 with seated breaks (~88ft total)) Assistive device: Rolling walker (2 wheels) Gait Pattern/deviations: Decreased step length - right, Decreased step length - left, Decreased stride length General Gait Details: Cues for posture, activity pacing, pursed-lip breathing; close chair follow for pt safety as she fatigues abruptly. Gait velocity: grossly <0.4 m/s    Posture / Balance Dynamic Sitting Balance Sitting balance - Comments: fair balance in sitting, but generally uses bil UE for support Balance Overall balance assessment: Needs assistance Sitting-balance support: Feet supported, No upper extremity supported Sitting balance-Leahy Scale: Fair Sitting balance - Comments: fair balance in sitting, but generally uses bil UE for support Standing balance support: Bilateral upper extremity supported, During functional activity, Reliant on assistive device for balance, No upper extremity supported Standing balance-Leahy Scale: Poor Standing balance comment: Reliant on BUE    Special considerations/life events  Oxygen  O2 5L [place] and Special service needs ***   Previous Home Environment (from acute therapy documentation) Living Arrangements: Spouse/significant other Available Help at Discharge: Family, Available PRN/intermittently Type of Home: House Home Layout: One level Home Access: Stairs to enter Entrance Stairs-Rails: None Entrance Stairs-Number of Steps: 1 Bathroom Shower/Tub: Agricultural engineer: Handicapped height Bathroom Accessibility: Yes Home Care Services: No Additional Comments: Pt confirms home history  Discharge Living Setting Plans for Discharge Living Setting: Patient's home, House, Lives with (comment) (Lives with husband.) Type of Home at Discharge: House Discharge Home Layout: One level Discharge Home Access: Ramped entrance Discharge Bathroom Shower/Tub: Tub/shower unit, Curtain Discharge Bathroom Toilet: Handicapped height Discharge Bathroom Accessibility: Yes How Accessible: Accessible via walker Does the patient have any problems obtaining your medications?: No  Social/Family/Support Systems Patient Roles:  Spouse, Parent Contact Information: Biannca Scantlin - spouse - Anticipated Caregiver: husband Cheryl Ability/Limitations of Caregiver: Husband can assist Caregiver Availability: 24/7 Discharge Plan Discussed with Primary Caregiver: Yes Is Caregiver In Agreement with Plan?: Yes Does Caregiver/Family have Issues with Lodging/Transportation while Pt is in Rehab?: No  Goals Patient/Family Goal for Rehab: PT/OT mod I and supervision goals Expected length of stay: 10-12 days Pt/Family Agrees to Admission and willing to participate: Yes Program Orientation Provided & Reviewed with Pt/Caregiver Including Roles  & Responsibilities: Yes  Decrease burden of Care through IP rehab admission: N/A  Possible need for SNF placement upon discharge: Not anticipated  Patient Condition: I have reviewed medical records from [facility], spoken with CM, and patient and spouse. I met with patient at the bedside for inpatient rehabilitation assessment.  Patient will benefit from ongoing PT and OT, can actively participate in 3 hours of therapy a day 5 days of the week, and can make measurable gains during the admission.  Patient will also benefit from the coordinated team approach during an Inpatient Acute Rehabilitation admission.  The patient will receive intensive  therapy as well as Rehabilitation physician, nursing, social worker, and care management interventions.  Due to bladder management, bowel management, safety, skin/wound care, disease management, medication administration, pain management, and patient education the patient requires 24 hour a day rehabilitation nursing.  The patient is currently CGA to min assist with mobility and basic ADLs.  Discharge setting and therapy post discharge at home with home health is anticipated.  Patient has agreed to participate in the Acute Inpatient Rehabilitation Program and will admit {Time; today/tomorrow:10263}.  Preadmission Screen Completed By:  Lovett CHRISTELLA Ropes, 05/21/2024 5:14 PM ______________________________________________________________________   Discussed status with Dr. PIERRETTE on *** at *** and received approval for admission today.  Admission Coordinator:  Lovett CHRISTELLA Ropes, RN, time PIERRETTEPattricia ***   Assessment/Plan: Diagnosis: *** Does the need for close, 24 hr/day Medical supervision in concert with the patient's rehab needs make it unreasonable for this patient to be served in a less intensive setting? {yes_no_potentially:3041433} Co-Morbidities requiring supervision/potential complications: *** Due to {due un:6958565}, does the patient require 24 hr/day rehab nursing? {yes_no_potentially:3041433} Does the patient require coordinated care of a physician, rehab nurse, PT, OT, and SLP to address physical and functional deficits in the context of the above medical diagnosis(es)? {yes_no_potentially:3041433} Addressing deficits in the following areas: {deficits:3041436} Can the patient actively participate in an intensive therapy program of at least 3 hrs of therapy 5 days a week? {yes_no_potentially:3041433} The potential for patient to make measurable gains while on inpatient rehab is {potential:3041437} Anticipated functional outcomes upon discharge from inpatient rehab: {functional outcomes:304600100} PT,  {functional outcomes:304600100} OT, {functional outcomes:304600100} SLP Estimated rehab length of stay to reach the above functional goals is: *** Anticipated discharge destination: {anticipated dc setting:21604} 10. Overall Rehab/Functional Prognosis: {potential:3041437}   MD Signature: ***

## 2024-05-21 NOTE — Progress Notes (Addendum)
 Physical Therapy Treatment Patient Details Name: Kathleen [name] MRN: [id number] DOB: [birth year] Today's Date: 05/21/2024   History of Present Illness Kathleen [name] 63 yo female presents with acute on chronic HFpEF now with paroxysmal AF RVR episode 9/20. PMH of heart failure, paroxysmal atrial fibrillation, sarcoidosis, chronic respiratory failure on oxygen , adrenal insufficiency, chronic pain.    PT Comments  Pt received in supine, anxious (see cog section) but oriented and agreeable to therapy session, with improved participation and tolerance for OOB mobility this date. Pt needing up to CGA to perform bed mobility, transfers and gait training x3 short bouts with close chair follow for safety. Pt SpO2 WFL on 5L O2 [place] at rest and 6L O2 [place] with exertional tasks. Pt agreeable to sit up in chair with encouragement post-exertion, chair alarm on for safety. Discussion on pt progress and disposition update with pt and supervising PT Vinie HERO, updated according to improved activity tolerance. Patient will benefit from intensive inpatient follow-up therapy, >3 hours/day.   If plan is discharge home, recommend the following: A little help with bathing/dressing/bathroom;A lot of help with walking and/or transfers;Assistance with cooking/housework;Help with stairs or ramp for entrance;Assist for transportation   Can travel by private vehicle     Yes  Equipment Recommendations  Hospital bed    Recommendations for Other Services       Precautions / Restrictions Precautions Precautions: Fall Recall of Precautions/Restrictions: Intact Precaution/Restrictions Comments: Back for comfort Restrictions Weight Bearing Restrictions Per Provider Order: No     Mobility  Bed Mobility Overal bed mobility: Needs Assistance Bed Mobility: Rolling, Sidelying to Sit, Sit to Sidelying Rolling: Supervision, Used rails Sidelying to sit: Supervision, Used rails, HOB elevated       General bed mobility  comments: S for safety, HOB elevated and use of rails but good log rolling technique, PTA assist with line mgmt    Transfers Overall transfer level: Needs assistance Equipment used: Rolling walker (2 wheels) Transfers: Sit to/from Stand Sit to Stand: Contact guard assist           General transfer comment: from EOB and chair heights, cues for safe UE placement needed. 3 reps from chair, x1 from EOB    Ambulation/Gait   Gait Distance (Feet): 25 Feet (35ft, seated break, 22ft x2 with seated breaks (~38ft total)) Assistive device: Rolling walker (2 wheels) Gait Pattern/deviations: Decreased step length - right, Decreased step length - left, Decreased stride length Gait velocity: grossly <0.4 m/s     General Gait Details: Cues for posture, activity pacing, pursed-lip breathing; close chair follow for pt safety as she fatigues abruptly.   Stairs             Wheelchair Mobility     Tilt Bed    Modified Rankin (Stroke Patients Only)       Balance Overall balance assessment: Needs assistance Sitting-balance support: Feet supported, No upper extremity supported Sitting balance-Leahy Scale: Fair Sitting balance - Comments: fair balance in sitting, but generally uses bil UE for support   Standing balance support: Bilateral upper extremity supported, During functional activity, Reliant on assistive device for balance, No upper extremity supported Standing balance-Leahy Scale: Poor Standing balance comment: Reliant on BUE                            Communication Communication Communication: No apparent difficulties  Cognition Arousal: Alert Behavior During Therapy: Anxious   PT - Cognitive impairments: Safety/Judgement, Attention,  Memory                       PT - Cognition Comments: Pt perseverating on fears that healthcare workers will harm her because she heard something in the news about a healthcare worker from [place] who went to  jail for harming patients. Pt states she did not sleep last night due to these fears. Pt tangential and needs constant reassurance and redirection to task during session, but very motivated to progress OOB mobility and prove that she can tolerate CIR. RN/Unit Director aware of pt concerns. RN/NT notified pt requesting a change of bed linens today, PTA removed bed linens once pt up in chair so they can be replaced. Following commands: Intact      Cueing Cueing Techniques: Verbal cues, Gestural cues  Exercises      General Comments General comments (skin integrity, edema, etc.): BP 111/54 (72) supine prior to EOB; BP 149/76 (96) sitting in chair post-acute HR 85-89; SpO2 96% on 4L sitting and to 99% on 6L with exertional tasks; SpO2 WFL on 5L at rest in chair post-exertion.      Pertinent Vitals/Pain Pain Assessment Pain Assessment: 0-10 Pain Score: 5  Pain Location: headache Pain Descriptors / Indicators: Discomfort, Headache Pain Intervention(s): Monitored during session, Repositioned, Patient requesting pain meds-RN notified (pt requested pain meds prior to OOB, then once RN arrived with pain meds, pt states I will get sick to my stomach if I take them now, I need to eat first RN offered crackers or bananas from her tray but pt defers, requesting to take meds later)    Home Living                          Prior Function            PT Goals (current goals can now be found in the care plan section) Acute Rehab PT Goals Patient Stated Goal: return home but I prefer high intensity rehab if I can get it. PT Goal Formulation: With patient Time For Goal Achievement: 05/29/24 Progress towards PT goals: Progressing toward goals    Frequency    Min 3X/week      PT Plan      Co-evaluation              AM-PAC PT 6 Clicks Mobility   Outcome Measure  Help needed turning from your back to your side while in a flat bed without using bedrails?: A Little Help  needed moving from lying on your back to sitting on the side of a flat bed without using bedrails?: A Little Help needed moving to and from a bed to a chair (including a wheelchair)?: A Little Help needed standing up from a chair using your arms (e.g., wheelchair or bedside chair)?: A Little Help needed to walk in hospital room?: A Lot (+2 safety) Help needed climbing 3-5 steps with a railing? : Total 6 Click Score: 15    End of Session Equipment Utilized During Treatment: Gait belt;Oxygen  (pt attempted to refuse gait belt but agreed when PTA states she needs to wear it for safety) Activity Tolerance: Patient tolerated treatment well Patient left: in chair;with call bell/phone within reach;with chair alarm set Nurse Communication: Mobility status;Other (comment) (wants linens changed (new ones brought to room), pt req pain meds, pt anxious due to complaints she made) PT Visit Diagnosis: Other abnormalities of gait and mobility (R26.89);Pain;Unsteadiness on  feet (R26.81) Pain - part of body:  (HA)     Time: 1047-1130 PT Time Calculation (min) (ACUTE ONLY): 43 min  Charges:    $Gait Training: 23-37 mins $Therapeutic Activity: 8-22 mins PT General Charges $$ ACUTE PT VISIT: 1 Visit                     Tenaya Hilyer P., PTA Acute Rehabilitation Services Secure Chat Preferred 9a-5:30pm Office: [phone]    [name] HERO [facility] 05/21/2024, 11:52 AM

## 2024-05-21 NOTE — Plan of Care (Signed)
  Problem: Clinical Measurements: Goal: Will remain free from infection Outcome: Progressing Goal: Respiratory complications will improve Outcome: Progressing Goal: Cardiovascular complication will be avoided Outcome: Progressing   Problem: Elimination: Goal: Will not experience complications related to urinary retention Outcome: Progressing   Problem: Cardiac: Goal: Ability to achieve and maintain adequate cardiopulmonary perfusion will improve Outcome: Progressing

## 2024-05-22 DIAGNOSIS — J849 Interstitial pulmonary disease, unspecified: Secondary | ICD-10-CM | POA: Diagnosis not present

## 2024-05-22 DIAGNOSIS — R600 Localized edema: Secondary | ICD-10-CM | POA: Diagnosis not present

## 2024-05-22 DIAGNOSIS — J9611 Chronic respiratory failure with hypoxia: Secondary | ICD-10-CM | POA: Diagnosis not present

## 2024-05-22 DIAGNOSIS — I5033 Acute on chronic diastolic (congestive) heart failure: Secondary | ICD-10-CM | POA: Diagnosis not present

## 2024-05-22 NOTE — TOC Progression Note (Addendum)
 Transition of Care [facility]) - Progression Note    Patient Details  Name: Kathleen [name] MRN: [id number] Date of Birth: 01/27/1961  Transition of [facility]) CM/SW Contact  Waddell Barnie Rama, RN Phone Number: 05/22/2024, 11:03 AM  Clinical Narrative:    Patient and spouse wanted PT re-evaluation for CIR.  She was re -eval and rec for CIR ,  CIR is now following for an CIR admit. Look like she has improved with therapy per therapy notes , so that she can tolerate the aggressive 3 hrs of therapy.   Expected Discharge Plan: Home w Home Health Services Barriers to Discharge: Continued Medical Work up               Expected Discharge Plan and Services In-house Referral: Clinical Social Work Discharge Planning Services: NA Post Acute Care Choice: Home Health Living arrangements for the past 2 months: Single Family Home                 DME Arranged: N/A DME Agency: NA                   Social Drivers of Health (SDOH) Interventions SDOH Screenings   Food Insecurity: No Food Insecurity (05/11/2024)  Housing: Low Risk  (05/11/2024)  Transportation Needs: No Transportation Needs (05/11/2024)  Utilities: Not At Risk (05/11/2024)  Alcohol Screen: Low Risk  (09/27/2023)  Depression (PHQ2-9): Medium Risk (04/23/2024)  Financial Resource Strain: Low Risk  (09/27/2023)  Physical Activity: Inactive (09/27/2023)  Social Connections: Socially Integrated (01/21/2024)  Stress: No Stress Concern Present (09/27/2023)  Tobacco Use: Low Risk  (05/11/2024)  Health Literacy: Medium Risk (12/20/2023)   Received from [facility]    Readmission Risk Interventions    05/12/2024   11:28 AM 04/27/2024    9:59 AM 01/27/2024   11:48 AM  Readmission Risk Prevention Plan  Transportation Screening Complete Complete Complete  Medication Review Oceanographer) Complete Complete Complete  PCP or Specialist appointment within 3-5 days of discharge Complete    HRI or Home Care Consult Complete  Complete Complete  SW Recovery Care/Counseling Consult Complete Complete Complete  Palliative Care Screening Not Applicable Not Applicable Not Applicable  Skilled Nursing Facility Not Applicable Not Applicable Patient Refused

## 2024-05-22 NOTE — Progress Notes (Signed)
 Inpatient Rehab Admissions Coordinator:    I await decision from Pt.'s insurance for CIR.   Leita Kleine, MS, CCC-SLP Rehab Admissions Coordinator  [phone] (celll) [phone] (office)

## 2024-05-22 NOTE — Progress Notes (Signed)
 Progress Note    Kathleen [name]   FMW:[id number]  DOB: [birth year]  DOA: 05/11/2024     11 PCP: Alphonsa Glendia LABOR, MD  Initial CC: SOB  Hospital Course: Kathleen [name] is a 63 y.o. female with PMH of heart failure, paroxysmal atrial fibrillation, sarcoidosis, chronic respiratory failure on oxygen , adrenal insufficiency, chronic pain presented secondary to shortness of breath and swelling, found to have evidence of acute on chronic heart failure, cardiology was consulted, patient was admitted and started on IV diuretics Patient is very ill with complex medical issues, with pulmonary sarcoidosis, previously on steroid and had AVN of the hip and multiple compression fracture, orthopedic felt not a candidate for surgery for AVN and has severely limited mobility, mostly bedbound except for [facility].  She has chronic hypoxic tori failure on supplemental oxygen .. 9/19> transferred from [facility] to [facility] for advanced heart failure team evaluation 9/22: s/p RHC-no pulmonary hypertension. Volume status stabilized, patient now on oral torsemide , cardiology team signed off Patient and family interested to pursue CIR upon discharge   Assessment and plan  Acute on chronic HFpEF Left small effusion/pulmonary congestion Chronic dyspnea Recent admissions for acute on chronic HFpEF Mild hypokalemia from diuresis PTA 22 LB weight gain w/ recent echo August EF 60 to 65% indeterminate diastolic function, elevated PASP in past. Managed with diuresis, transferred to [facility] for AHF team eval Recent pulmonary eval at [facility] requesting RHC to eval for underlying pulmonary hypertension given her sarcoid/ILD With aggressive diuresis: About 30 pound weight loss and remains euvolemic, Lasix  discontinued 9/21> resumed torsemide  po 9/23 RHC 9/22: Mildly elevated right-sided filling pressure no significant pulmonary hypertension normal PCWP preserved cardiac output Cardiology has signed off plan is to discharge  New onset  PA-fib: Heparin  has been switched to Eliquis .She had history of A-fib when she was transferred to [facility] in 2017 but had converted to sinus rhythm upon transfer.  Watch for any kind of fall risk and adjust as OP   Pulmonary sarcoidosis/ILD Chronic hypoxic respiratory failure on 5 L [place]: Pulmonary status remains tenuous, continue home oxygen  setting RHC finding with normal pulmonary pressure as above.   she is followed by [name]  has been stopped, cannot tolerate methotrexate due to diarrhea hair loss, cannot tolerate CellCept due to diarrhea>advised pulmonary rehab. Last CT chest 5/26 /25> showed similar appearing chronic bilateral upper lobe paramediastinal architectural distortion, bronchiectasis and peribronchial consolidation/scaring  Normocytic anemia: Suspect from chronic illness hemoglobin stable 9 to 10 g/dL  Lethargy Weakness: Mental status improved stable. Minimize opioid/pain meds continue PT OT   Goals of care Bed bound w/ dyspnea Ambulatory dysfunction Frequent hospitalizations-she had 9 hospitalizations since January this year Overall poor prognosis, question probably has progression of the disease.  Patient was transferred for cardiac cath RHC without significant pulmonary hypertension.Palliative care continues to follow along. She is at full code but I am very concerned , overall prognosis does not appear bright  OSA Not using CPAP  Chronic pain syndrome Anxiety/depression Pain controlled mood stable continue home meds including -BuSpar , Topamax , oxycodone   Adrenal insufficiency Hypothyroidism Chronic hypotension Continue her synthroid , midodrine  and Cortef  bid.  Received IV Solu-Medrol  this admission  HLD Continue statin  Constipation continue Linzess , MiraLAX .  Last bowel movement 9/20  Class I Obesity w/ Body mass index is 35.78 kg/m. Will benefit with PCP follow-up, weight loss,healthy lifestyle  Interval History:  No events overnight.  About to work  with mobility this morning.  Awaiting CIR evaluation.  Otherwise doing  okay.  Assessment and Plan: * Acute on chronic heart failure with preserved ejection fraction (HFpEF) (HCC) Presenting with dyspnea, 2+ pitting bilateral lower extremity edema, weight gain per charts 22 pounds since recent hospitalization.  X-ray shows pulmonary vascular congestion.  BNP-53.  Likely not reflective of volume status due to habitus.  No increased O2 requirements.  Last echo 04/24/2024 EF of 60 to 65%, grade 1 DD.  Reports compliance with Lasix  20 mg daily.  Recent hospitalization for similar. -IV Lasix  80 mg Given, Continue 40 q12h - Strict input output Daily weights   Chronic respiratory failure with hypoxia (HCC) On 5 L at baseline, O2 sats currently 90 to 99%.  Interstitial lung disease (HCC) History of pulmonary hypertension, sarcoidosis, on chronic 5 L O2 at baseline. -125 mg Solu-Medrol  given in ED of dyspnea. -Further dosing pending clinical course.  Anxiety and depression Resume Cymbalta , buspirone .  Adrenal insufficiency (Addison's disease) (HCC) Blood pressure stable. -Resume Solu-Cortef , midodrine . -Doubt need for stress dose steroids at this time.  Hypothyroidism Resume Synthroid   Chronic pain syndrome Resume PTA oxycodone  50 mg every 4 hourly as needed  Obstructive sleep apnea Not on CPAP.    Old records reviewed in assessment of this patient  Antimicrobials:   DVT prophylaxis:   apixaban  (ELIQUIS ) tablet 5 mg   Code Status:   Code Status: Full Code  Mobility Assessment (Last 72 Hours)     Mobility Assessment     Row Name 05/22/24 0824 05/21/24 1942 05/21/24 1600 05/21/24 1100 05/21/24 0900   Does the patient have exclusion criteria? No - Perform mobility assessment No - Perform mobility assessment No - Perform mobility assessment -- No - Perform mobility assessment   What is the highest level of mobility based on the mobility assessment? Level 4 (Ambulates with  assistance) - Balance while stepping forward/back - Complete Level 4 (Ambulates with assistance) - Balance while stepping forward/back - Complete -- Level 4 (Ambulates with assistance) - Balance while stepping forward/back - Complete Level 4 (Ambulates with assistance) - Balance while stepping forward/back - Complete   Is the above level different from baseline mobility prior to current illness? Yes - Recommend PT order Yes - Recommend PT order Yes - Recommend PT order -- Yes - Recommend PT order    Row Name 05/20/24 2000 05/20/24 1600 05/20/24 1300 05/20/24 0930 05/19/24 2048   Does the patient have exclusion criteria? No - Perform mobility assessment No - Perform mobility assessment -- No - Perform mobility assessment No - Perform mobility assessment   What is the highest level of mobility based on the mobility assessment? Level 4 (Ambulates with assistance) - Balance while stepping forward/back - Complete -- Level 4 (Ambulates with assistance) - Balance while stepping forward/back - Complete Level 4 (Ambulates with assistance) - Balance while stepping forward/back - Complete Level 3 (Stands with assistance) - Balance while standing  and cannot march in place   Is the above level different from baseline mobility prior to current illness? Yes - Recommend PT order Yes - Recommend PT order -- Yes - Recommend PT order Yes - Recommend PT order    Row Name 05/19/24 1339           What is the highest level of mobility based on the mobility assessment? Level 3 (Stands with assistance) - Balance while standing  and cannot march in place          Barriers to discharge: None Disposition Plan: Possible CIR HH orders placed: N/A Status  is: Inpatient  Objective: Blood pressure (!) 131/57, pulse 89, temperature 98 F (36.7 C), temperature source Oral, resp. rate 14, height 5' 10 (1.778 m), weight 113.1 kg, last menstrual period 05/17/2016, SpO2 95%.  Examination:  Physical Exam Constitutional:       Appearance: Normal appearance.  HENT:     Head: Normocephalic and atraumatic.     Mouth/Throat:     Mouth: Mucous membranes are moist.  Eyes:     Extraocular Movements: Extraocular movements intact.  Cardiovascular:     Rate and Rhythm: Normal rate and regular rhythm.  Pulmonary:     Effort: Pulmonary effort is normal. No respiratory distress.     Breath sounds: No wheezing.     Comments: Scattered coarse breath sounds worse bibasilar Abdominal:     General: Bowel sounds are normal. There is no distension.     Palpations: Abdomen is soft.     Tenderness: There is no abdominal tenderness.  Musculoskeletal:        General: Normal range of motion.     Cervical back: Normal range of motion and neck supple.     Right lower leg: Edema (trace/1+) present.     Left lower leg: Edema (trace / 1+) present.  Skin:    General: Skin is warm and dry.  Neurological:     General: No focal deficit present.     Mental Status: She is alert.  Psychiatric:        Mood and Affect: Mood normal.      Consultants:  Cardiology  Procedures:    Data Reviewed: No results found. However, due to the size of the patient record, not all encounters were searched. Please check Results Review for a complete set of results.  I have reviewed pertinent nursing notes, vitals, labs, and images as necessary. I have ordered labwork to follow up on as indicated.  I have reviewed the last notes from staff over past 24 hours. I have discussed patient's care plan and test results with nursing staff, CM/SW, and other staff as appropriate.  Time spent: Greater than 50% of the 55 minute visit was spent in counseling/coordination of care for the patient as laid out in the A&P.   LOS: 11 days   Alm Apo, MD [facility] 05/22/2024, 1:25 PM

## 2024-05-22 NOTE — Progress Notes (Signed)
 Pt. Requesting an XR be done to check the status of the fractures to her back. Dr. Patsy made aware.

## 2024-05-22 NOTE — Progress Notes (Signed)
 Mobility Specialist Progress Note:    05/22/24 1152  Mobility  Activity Ambulated with assistance;Pivoted/transferred from bed to chair  Level of Assistance Standby assist, set-up cues, supervision of patient - no hands on  Assistive Device Front wheel walker  Distance Ambulated (ft) 10 ft  Activity Response Tolerated well  Mobility Referral Yes  Mobility visit 1 Mobility  Mobility Specialist Start Time (ACUTE ONLY) 1152  Mobility Specialist Stop Time (ACUTE ONLY) 1212  Mobility Specialist Time Calculation (min) (ACUTE ONLY) 20 min   Pt pleasant in bed feeling a little anxious but agreeable to session. No c/o any symptoms. Pt able to move and get out of bed on own w/ no assist. Pt needing cueing to breathe better. Pt able to ambulate room, then ambulate to chair w/ SV. Pt left in recliner w/ lunch and all things met.   Venetia Keel Mobility Specialist Please Neurosurgeon or Rehab Office at [phone]

## 2024-05-23 ENCOUNTER — Inpatient Hospital Stay (HOSPITAL_COMMUNITY)

## 2024-05-23 ENCOUNTER — Other Ambulatory Visit (HOSPITAL_COMMUNITY): Payer: Self-pay

## 2024-05-23 DIAGNOSIS — M4856XA Collapsed vertebra, not elsewhere classified, lumbar region, initial encounter for fracture: Secondary | ICD-10-CM | POA: Diagnosis not present

## 2024-05-23 DIAGNOSIS — I5033 Acute on chronic diastolic (congestive) heart failure: Secondary | ICD-10-CM | POA: Diagnosis not present

## 2024-05-23 DIAGNOSIS — R29890 Loss of height: Secondary | ICD-10-CM | POA: Diagnosis not present

## 2024-05-23 MED ORDER — ZIKS ARTHRITIS PAIN RELIEF 0.025-1-12 % EX CREA
1.0000 | TOPICAL_CREAM | Freq: Three times a day (TID) | CUTANEOUS | 0 refills | Status: AC | PRN
  Filled 2024-05-23: qty 56.6, fill #0

## 2024-05-23 NOTE — Progress Notes (Signed)
 Inpatient Rehab Admissions Coordinator:    Pt.'s case was denied by insurance. I spoke with medical director Arthea Gunther and he does not feel that this is an appropriate CIR admission either. I will not appeal the denial. Pt. Notified.   Leita Kleine, MS, CCC-SLP Rehab Admissions Coordinator  [phone] (celll) [phone] (office)

## 2024-05-23 NOTE — Plan of Care (Signed)
   Problem: Education: Goal: Knowledge of General Education information will improve Description: Including pain rating scale, medication(s)/side effects and non-pharmacologic comfort measures Outcome: Progressing   Problem: Activity: Goal: Risk for activity intolerance will decrease Outcome: Progressing   Problem: Coping: Goal: Level of anxiety will decrease Outcome: Progressing

## 2024-05-23 NOTE — TOC Transition Note (Signed)
 Transition of Care [facility]) - Discharge Note   Patient Details  Name: Kathleen [name] MRN: [id number] Date of Birth: [birth year]  Transition of Care [facility] CM/SW Contact:  Marval Gell, RN Phone Number: 05/23/2024, 12:01 PM   Clinical Narrative:     [facility] w patient and family in room, discussed CIR denial and preference for DC. Patient states that she will not go to a facility. She wants to go home. She is active w [facility] and I have notified them of dispo plan.  No DME needs, family will transport home   Final next level of care: Home w Home Health Services Barriers to Discharge: No Barriers Identified   Patient Goals and CMS Choice Patient states their goals for this hospitalization and ongoing recovery are:: To return home CMS Medicare.gov Compare Post Acute Care list provided to:: Patient Choice offered to / list presented to : Patient [place] ownership interest in [facility].provided to::  (NA)    Discharge Placement                       Discharge Plan and Services Additional resources added to the After Visit Summary for   In-house Referral: Clinical Social Work Discharge Planning Services: NA Post Acute Care Choice: Home Health          DME Arranged: N/A DME Agency: NA       HH Arranged: OT, PT, Social Work [employer] Agency: [facility] Date [facility] Agency Contacted: 05/23/24 Time HH Agency Contacted: 1201 Representative spoke with at [facility] Agency: Darleene  Social Drivers of Health (SDOH) Interventions SDOH Screenings   Food Insecurity: No Food Insecurity (05/11/2024)  Housing: Low Risk  (05/11/2024)  Transportation Needs: No Transportation Needs (05/11/2024)  Utilities: Not At Risk (05/11/2024)  Alcohol Screen: Low Risk  (09/27/2023)  Depression (PHQ2-9): Medium Risk (04/23/2024)  Financial Resource Strain: Low Risk  (09/27/2023)  Physical Activity: Inactive (09/27/2023)  Social Connections: Socially Integrated (01/21/2024)  Stress: No Stress Concern  Present (09/27/2023)  Tobacco Use: Low Risk  (05/11/2024)  Health Literacy: Medium Risk (12/20/2023)   Received from [facility]     Readmission Risk Interventions    05/12/2024   11:28 AM 04/27/2024    9:59 AM 01/27/2024   11:48 AM  Readmission Risk Prevention Plan  Transportation Screening Complete Complete Complete  Medication Review [employer]) Complete Complete Complete  PCP or Specialist appointment within 3-5 days of discharge Complete    HRI or Home Care Consult Complete Complete Complete  SW Recovery Care/Counseling Consult Complete Complete Complete  Palliative Care Screening Not Applicable Not Applicable Not Applicable  Skilled Nursing Facility Not Applicable Not Applicable Patient Refused

## 2024-05-23 NOTE — Discharge Summary (Signed)
 Physician Discharge Summary   Kathleen [name] FMW:[id number] DOB: [birth year] DOA: 05/11/2024  PCP: Alphonsa Glendia LABOR, MD  Admit date: 05/11/2024 Discharge date: 05/23/2024  Admitted From: Home Disposition:  Home Discharging physician: Alm Apo, MD Barriers to discharge: none   Discharge Condition: stable CODE STATUS: Full Diet recommendation:  Diet Orders (From admission, onward)     Start     Ordered   05/23/24 0000  Diet - low sodium heart healthy        05/23/24 1352   05/20/24 0000  Diet - low sodium heart healthy        05/20/24 1127   05/18/24 1526  Diet Heart Room service appropriate? Yes; Fluid consistency: Thin  Diet effective now       Question Answer Comment  Room service appropriate? Yes   Fluid consistency: Thin      05/18/24 1525            Hospital Course: Kathleen [name] is a 63 y.o. female with PMH of heart failure, paroxysmal atrial fibrillation, sarcoidosis, chronic respiratory failure on oxygen , adrenal insufficiency, chronic pain presented secondary to shortness of breath and swelling, found to have evidence of acute on chronic heart failure, cardiology was consulted, patient was admitted and started on IV diuretics Patient is very ill with complex medical issues, with pulmonary sarcoidosis, previously on steroid and had AVN of the hip and multiple compression fracture, orthopedic felt not a candidate for surgery for AVN and has severely limited mobility, mostly bedbound except for [facility].  She has chronic hypoxic tori failure on supplemental oxygen .. 9/19> transferred from [facility] to [facility] for advanced heart failure team evaluation 9/22: s/p RHC-no pulmonary hypertension. Volume status stabilized, patient now on oral torsemide , cardiology team signed off.   Assessment and plan  Acute on chronic HFpEF Left small effusion/pulmonary congestion Chronic dyspnea Recent admissions for acute on chronic HFpEF Mild hypokalemia from diuresis PTA 22  LB weight gain w/ recent echo August EF 60 to 65% indeterminate diastolic function, elevated PASP in past. Managed with diuresis, transferred to [facility] for AHF team eval Recent pulmonary eval at [facility] requesting RHC to eval for underlying pulmonary hypertension given her sarcoid/ILD With aggressive diuresis: About 30 pound weight loss and remains euvolemic, Lasix  discontinued 9/21> resumed torsemide  po 9/23 RHC 9/22: Mildly elevated right-sided filling pressure no significant pulmonary hypertension normal PCWP preserved cardiac output Cardiology has signed off plan is to discharge  New onset PA-fib: Heparin  has been switched to Eliquis .She had history of A-fib when she was transferred to [facility] in 2017 but had converted to sinus rhythm upon transfer.  Watch for any kind of fall risk and adjust as OP   Pulmonary sarcoidosis/ILD Chronic hypoxic respiratory failure on 5 L [place]: Pulmonary status remains tenuous, continue home oxygen  setting RHC finding with normal pulmonary pressure as above.   she is followed by [name]  has been stopped, cannot tolerate methotrexate due to diarrhea hair loss, cannot tolerate CellCept due to diarrhea>advised pulmonary rehab. Last CT chest 5/26 /25> showed similar appearing chronic bilateral upper lobe paramediastinal architectural distortion, bronchiectasis and peribronchial consolidation/scaring  Normocytic anemia: Suspect from chronic illness hemoglobin stable 9 to 10 g/dL  Lethargy Weakness: Mental status improved stable. Minimize opioid/pain meds continue PT OT   Goals of care Bed bound w/ dyspnea Ambulatory dysfunction Frequent hospitalizations-she had 9 hospitalizations since January this year Overall poor prognosis, question probably has progression of the disease.  Patient was transferred for cardiac cath RHC  without significant pulmonary hypertension.Palliative care follwed  OSA Not using CPAP  Chronic pain syndrome Anxiety/depression Pain  controlled mood stable continue home meds including -BuSpar , Topamax , oxycodone   Adrenal insufficiency Hypothyroidism Chronic hypotension Continue synthroid , midodrine  and Cortef   HLD Continue statin  Constipation continue Linzess , MiraLAX .  Class I Obesity w/ Body mass index is 35.78 kg/m. Will benefit with PCP follow-up, weight loss,healthy lifestyle  Assessment and Plan: * Acute on chronic heart failure with preserved ejection fraction (HFpEF) (HCC) Presenting with dyspnea, 2+ pitting bilateral lower extremity edema, weight gain per charts 22 pounds since recent hospitalization.  X-ray shows pulmonary vascular congestion.  BNP-53.  Likely not reflective of volume status due to habitus.  No increased O2 requirements.  Last echo 04/24/2024 EF of 60 to 65%, grade 1 DD.  Reports compliance with Lasix  20 mg daily.  Recent hospitalization for similar. -IV Lasix  80 mg Given, Continue 40 q12h - Strict input output Daily weights   Chronic respiratory failure with hypoxia (HCC) On 5 L at baseline, O2 sats currently 90 to 99%.  Interstitial lung disease (HCC) History of pulmonary hypertension, sarcoidosis, on chronic 5 L O2 at baseline. -125 mg Solu-Medrol  given in ED of dyspnea. -Further dosing pending clinical course.  Anxiety and depression Resume Cymbalta , buspirone .  Adrenal insufficiency (Addison's disease) (HCC) Blood pressure stable. -Resume Solu-Cortef , midodrine . -Doubt need for stress dose steroids at this time.  Hypothyroidism Resume Synthroid   Chronic pain syndrome Resume PTA oxycodone  50 mg every 4 hourly as needed  Obstructive sleep apnea Not on CPAP.   The patient's acute and chronic medical conditions were treated accordingly. On day of discharge, patient was felt deemed stable for discharge. Patient/family member advised to call PCP or come back to ER if needed.   Principal Diagnosis: Acute on chronic heart failure with preserved ejection fraction (HFpEF)  (HCC)  Discharge Diagnoses: Active Hospital Problems   Diagnosis Date Noted   Acute on chronic heart failure with preserved ejection fraction (HFpEF) (HCC) 05/11/2024   Chronic respiratory failure with hypoxia (HCC) 05/22/2021    Priority: High   Interstitial lung disease (HCC) 04/14/2019    Priority: High   Bilateral lower extremity edema 01/22/2013    Priority: High   Anxiety and depression 05/22/2021    Priority: Medium    Hypothyroidism 01/15/2017   Adrenal insufficiency (Addison's disease) (HCC) 08/22/2016   Chronic pain syndrome 04/20/2016   Obstructive sleep apnea 02/18/2014    Resolved Hospital Problems  No resolved problems to display.     Discharge Instructions     (HEART FAILURE PATIENTS) Call MD:  Anytime you have any of the following symptoms: 1) 3 pound weight gain in 24 hours or 5 pounds in 1 week 2) shortness of breath, with or without a dry hacking cough 3) swelling in the hands, feet or stomach 4) if you have to sleep on extra pillows at night in order to breathe.   Complete by: As directed    Diet - low sodium heart healthy   Complete by: As directed    Diet - low sodium heart healthy   Complete by: As directed    Discharge instructions   Complete by: As directed    Please call call MD or return to ER for similar or worsening recurring problem that brought you to hospital or if any fever,nausea/vomiting,abdominal pain, uncontrolled pain, chest pain,  shortness of breath or any other alarming symptoms.  Please follow-up your doctor as instructed in a week time and call  the office for appointment.  Please avoid alcohol, smoking, or any other illicit substance and maintain healthy habits including taking your regular medications as prescribed.  You were cared for by a hospitalist during your hospital stay. If you have any questions about your discharge medications or the care you received while you were in the hospital after you are discharged, you can call the  unit and ask to speak with the hospitalist on call if the hospitalist that took care of you is not available.  Once you are discharged, your primary care physician will handle any further medical issues. Please note that NO REFILLS for any discharge medications will be authorized once you are discharged, as it is imperative that you return to your primary care physician (or establish a relationship with a primary care physician if you do not have one) for your aftercare needs so that they can reassess your need for medications and monitor your lab values   Increase activity slowly   Complete by: As directed    Increase activity slowly   Complete by: As directed       Allergies as of 05/23/2024       Reactions   Bee Venom Anaphylaxis   Contrast Media [iodinated Contrast Media] Other (See Comments)   CT chest w/ contrast at OSH followed by SOB resolved w/ single dose steroids, never ENT swelling, intubation or pressors, has had multiple contrasted scans since   Bactrim  [sulfamethoxazole -trimethoprim ] Nausea And Vomiting   Codeine Itching        Medication List     STOP taking these medications    amoxicillin  875 MG tablet Commonly known as: AMOXIL    DULoxetine  60 MG capsule Commonly known as: CYMBALTA    furosemide  20 MG tablet Commonly known as: LASIX        TAKE these medications    acetaminophen  325 MG tablet Commonly known as: TYLENOL  Take 2 tablets (650 mg total) by mouth every 6 (six) hours as needed for mild pain (pain score 1-3) or fever (or Fever >/= 101).   alendronate  70 MG tablet Commonly known as: FOSAMAX  Take 1 tablet (70 mg total) by mouth every Saturday. Take with a full glass of water  on an empty stomach.   busPIRone  15 MG tablet Commonly known as: BUSPAR  Take 1 tablet (15 mg) by mouth 3 times daily.   Calcium  Citrate 333 MG Tabs Take one tablet by mouth 3 (three) times daily with meals.   capsaicin-methyl sal-menthol  0.025-1-12 % topical  cream Generic drug: Capsaicin-Menthol -Methyl Sal Apply 1 Application topically 3 (three) times daily as needed.   Eliquis  5 MG Tabs tablet Generic drug: apixaban  Take 1 tablet (5 mg total) by mouth 2 (two) times daily.   FeroSul 325 (65 Fe) MG tablet Generic drug: ferrous sulfate  Take 1 tablet (325 mg total) by mouth once a week. What changed: when to take this   folic acid  1 MG tablet Commonly known as: FOLVITE  Take 1 tablet (1 mg total) by mouth daily.   hydrocortisone  10 MG tablet Commonly known as: CORTEF  Take 2 tablets by mouth at 8:00 am and 1 tablet at lunch. (May double dose on sick days x 3-5 days)   levothyroxine  50 MCG tablet Commonly known as: SYNTHROID  Take 1 tablet by mouth daily before breakfast.   Linzess  145 MCG Caps capsule Generic drug: linaclotide  Take 1 capsule (145 mcg total) by mouth daily before breakfast.   midodrine  5 MG tablet Commonly known as: PROAMATINE  Take 1 tablet (5 mg  total) by mouth 2 (two) times daily with a meal.   ondansetron  8 MG disintegrating tablet Commonly known as: ZOFRAN -ODT Dissolve 1 tablet (8 mg total) by mouth every 8 (eight) hours as needed. What changed: reasons to take this   oxyCODONE  15 MG immediate release tablet Commonly known as: ROXICODONE  Take 1 tablet (15 mg total) by mouth every 4 (four) hours as needed for pain   OXYGEN  Inhale 5 L into the lungs continuous.   pantoprazole  40 MG tablet Commonly known as: PROTONIX  Take 1 tablet (40 mg total) by mouth 2 (two) times daily.   polyethylene glycol 17 g packet Commonly known as: MIRALAX  / GLYCOLAX  Take 17 g by mouth 2 (two) times daily.   rosuvastatin  20 MG tablet Commonly known as: Crestor  Take 1 tablet (20 mg total) by mouth daily.   senna-docusate 8.6-50 MG tablet Commonly known as: Senokot-S Take 2 tablets by mouth at bedtime.   topiramate  50 MG tablet Commonly known as: Topamax  Take 1 tablet (50 mg total) by mouth daily with lunch.   torsemide   20 MG tablet Commonly known as: DEMADEX  Take 1 tablet (20 mg total) by mouth daily.   Vitamin D  (Ergocalciferol ) 1.25 MG (50000 UNIT) Caps capsule Commonly known as: DRISDOL  Take 1 capsule (50,000 Units total) by mouth every Sunday.   Yupelri  175 MCG/3ML nebulizer solution Generic drug: revefenacin  Take 3 mLs (175 mcg total) by nebulization daily.        Follow-up Information     [facility] Follow up.   Specialty: Home Health Services Why: Agency will call you to set up apt times Contact information: [address] [place] [address] [phone]         Alphonsa Glendia LABOR, MD Follow up in 1 week(s).   Specialty: Family Medicine Contact information: [address] [place] [address] [phone]                Allergies  Allergen Reactions   Bee Venom Anaphylaxis   Contrast Media [Iodinated Contrast Media] Other (See Comments)    CT chest w/ contrast at OSH followed by SOB resolved w/ single dose steroids, never ENT swelling, intubation or pressors, has had multiple contrasted scans since   Bactrim  [Sulfamethoxazole -Trimethoprim ] Nausea And Vomiting   Codeine Itching    Consultations:   Procedures:   Discharge Exam: BP 129/63 (BP Location: Right Arm)   Pulse 82   Temp 97.7 F (36.5 C) (Oral)   Resp 18   Ht 5' 10 (1.778 m)   Wt 115 kg   LMP 05/17/2016   SpO2 98%   BMI 36.38 kg/m  Physical Exam Constitutional:      Appearance: Normal appearance.  HENT:     Head: Normocephalic and atraumatic.     Mouth/Throat:     Mouth: Mucous membranes are moist.  Eyes:     Extraocular Movements: Extraocular movements intact.  Cardiovascular:     Rate and Rhythm: Normal rate and regular rhythm.  Pulmonary:     Effort: Pulmonary effort is normal. No respiratory distress.     Breath sounds: No wheezing.     Comments: Scattered coarse breath sounds worse bibasilar Abdominal:     General: Bowel sounds are normal.  There is no distension.     Palpations: Abdomen is soft.     Tenderness: There is no abdominal tenderness.  Musculoskeletal:        General: Normal range of motion.  Cervical back: Normal range of motion and neck supple.     Right lower leg: Edema (trace/1+) present.     Left lower leg: Edema (trace / 1+) present.  Skin:    General: Skin is warm and dry.  Neurological:     General: No focal deficit present.     Mental Status: She is alert.  Psychiatric:        Mood and Affect: Mood normal.      The results of significant diagnostics from this hospitalization (including imaging, microbiology, ancillary and laboratory) are listed below for reference.   Microbiology: No results found for this or any previous visit (from the past 240 hours).   Labs: BNP (last 3 results) Recent Labs    04/23/24 1236 04/29/24 1154 05/11/24 1601  BNP 36.0 31.0 53.0   Basic Metabolic Panel: Recent Labs  Lab 05/17/24 0223 05/18/24 0324 05/18/24 1442 05/18/24 1443 05/19/24 0235 05/20/24 0347  NA 138 135 135 136 139 139  K 3.6 3.8 4.6 4.5 4.4 4.3  CL 95* 96*  --   --  100 100  CO2 32 30  --   --  29 29  GLUCOSE 146* 144*  --   --  168* 125*  BUN 19 12  --   --  5* 9  CREATININE 1.08* 1.03*  --   --  0.87 1.11*  CALCIUM  9.0 8.9  --   --  9.2 9.2   Liver Function Tests: No results for input(s): AST, ALT, ALKPHOS, BILITOT, PROT, ALBUMIN in the last 168 hours. No results for input(s): LIPASE, AMYLASE in the last 168 hours. No results for input(s): AMMONIA  in the last 168 hours. CBC: Recent Labs  Lab 05/17/24 0223 05/18/24 0324 05/18/24 1442 05/18/24 1443 05/19/24 0235 05/20/24 0347  WBC 12.1* 10.8*  --   --  10.0 10.1  HGB 9.7* 9.1* 9.9* 9.9* 8.9* 9.2*  HCT 31.6* 29.9* 29.0* 29.0* 29.9* 31.1*  MCV 87.3 87.9  --   --  89.8 89.4  PLT 427* 385  --   --  392 376   Cardiac Enzymes: No results for input(s): CKTOTAL, CKMB, CKMBINDEX, TROPONINI in the last  168 hours. BNP: Invalid input(s): POCBNP CBG: Recent Labs  Lab 05/18/24 0649  GLUCAP 130*   D-Dimer No results for input(s): DDIMER in the last 72 hours. Hgb A1c No results for input(s): HGBA1C in the last 72 hours. Lipid Profile No results for input(s): CHOL, HDL, LDLCALC, TRIG, CHOLHDL, LDLDIRECT in the last 72 hours. Thyroid  function studies No results for input(s): TSH, T4TOTAL, T3FREE, THYROIDAB in the last 72 hours.  Invalid input(s): FREET3 Anemia work up No results for input(s): VITAMINB12, FOLATE, FERRITIN, TIBC, IRON , RETICCTPCT in the last 72 hours. Urinalysis    Component Value Date/Time   COLORURINE COLORLESS (A) 05/11/2024 1755   APPEARANCEUR CLEAR 05/11/2024 1755   APPEARANCEUR Clear 05/05/2024 1456   LABSPEC 1.004 (L) 05/11/2024 1755   PHURINE 7.0 05/11/2024 1755   GLUCOSEU NEGATIVE 05/11/2024 1755   GLUCOSEU NEGATIVE 05/29/2010 1622   HGBUR NEGATIVE 05/11/2024 1755   BILIRUBINUR NEGATIVE 05/11/2024 1755   BILIRUBINUR Negative 05/05/2024 1456   KETONESUR NEGATIVE 05/11/2024 1755   PROTEINUR NEGATIVE 05/11/2024 1755   UROBILINOGEN 1.0 04/30/2020 0831   UROBILINOGEN 0.2 06/16/2014 0942   NITRITE NEGATIVE 05/11/2024 1755   LEUKOCYTESUR NEGATIVE 05/11/2024 1755   Sepsis Labs Recent Labs  Lab 05/17/24 0223 05/18/24 0324 05/19/24 0235 05/20/24 0347  WBC 12.1* 10.8* 10.0 10.1  Microbiology No results found for this or any previous visit (from the past 240 hours).  Procedures/Studies: DG Lumbar Spine 2-3 Views Result Date: 05/23/2024 EXAM: 2 or 3 VIEW(S) XRAY OF THE LUMBAR SPINE 05/23/2024 09:17:00 AM COMPARISON: CT from 04/26/2024. CLINICAL HISTORY: 212442 Compression fracture of lumbar vertebra (HCC) [id number]. Ordered for follow up of compression fracture of lumbar vert. Best images obtained due to patient body habitus. FINDINGS: LUMBAR SPINE: BONES: Fractures noted throughout the lumbar spine involving T11, L1,  L3, L4 and L5. The T11, L3 and L4 compression fractures are stable. These are all stable when compared with the CT from 04/26/2024. The most severe compression deformity is at the L1 level where there is approximately 50% loss of the vertebral body height. No aggressive appearing osseous lesion. Alignment is normal. DISCS AND DEGENERATIVE CHANGES: No severe degenerative changes. SOFT TISSUES: No acute abnormality. LIMITATIONS/ARTIFACTS: Best images obtained due to patient body habitus. IMPRESSION: 1. Stable multilevel vertebral compression fractures involving T11, L1, L3, L4, and L5, unchanged from 04/26/24, with the most severe deformity at L1 demonstrating approximately 50% vertebral body height loss. Electronically signed by: Waddell Calk MD 05/23/2024 12:26 PM EDT RP Workstation: [id number]   CARDIAC CATHETERIZATION Result Date: 05/18/2024 1. Mildly elevated right-sided filling pressures. 2. No significant pulmonary hypertension. 3. Normal PCWP. 4. Preserved cardiac output.   CARDIAC CATHETERIZATION Result Date: 05/18/2024 Case aborted due to abnormal mental status. Patient at baseline oxygen  requirement. Postponed until after IV diuresis over the weekend.   DG CHEST PORT 1 VIEW Result Date: 05/12/2024 CLINICAL DATA:  Chest pain and shortness of breath. EXAM: PORTABLE CHEST 1 VIEW COMPARISON:  Radiograph yesterday FINDINGS: Unchanged cardiomediastinal contours. Increasing ill-defined opacity in the left hemithorax. Left pleural effusion may have increased. No visible pneumothorax. Stable osseous structures. IMPRESSION: Increasing ill-defined opacity in the left hemithorax, may represent worsening pneumonia or asymmetric edema. Left pleural effusion may have increased. Electronically Signed   By: Andrea Gasman M.D.   On: 05/12/2024 20:06   DG Chest Portable 1 View Result Date: 05/11/2024 CLINICAL DATA:  Bilateral lower extremity and abdominal swelling. History of CHF. EXAM: PORTABLE CHEST 1 VIEW  COMPARISON:  05/01/2024. FINDINGS: The heart size and mediastinal contours are unchanged. No significant change in pulmonary vascular congestion. Similar small left pleural effusion with left basilar opacity. No pneumothorax. No acute osseous abnormality. IMPRESSION: 1. Similar small left pleural effusion with left basilar opacity. 2. Similar pulmonary vascular congestion. Electronically Signed   By: Harrietta Sherry M.D.   On: 05/11/2024 16:17   DG Chest 2 View Result Date: 05/01/2024 EXAM: 2 VIEW(S) XRAY OF THE CHEST 05/01/2024 10:24:54 AM COMPARISON: 04/29/2024 CLINICAL HISTORY: Pleural effusion FINDINGS: LUNGS AND PLEURA: Decreased left lower lung opacity. Decreased left pleural effusion. Mild pulmonary edema is slightly decreased. HEART AND MEDIASTINUM: No acute abnormality of the cardiac and mediastinal silhouettes. BONES AND SOFT TISSUES: No acute osseous abnormality. IMPRESSION: 1. Decreased left pleural effusion and left lower lung opacity. 2. Mild pulmonary edema, slightly decreased. Electronically signed by: Donnice Mania MD 05/01/2024 02:14 PM EDT RP Workstation: [id number]   US  CHEST (PLEURAL EFFUSION) Result Date: 05/01/2024 CLINICAL DATA:  Patient admitted with dyspnea, imaging shows left pleural effusion. Request for possible thoracentesis. EXAM: LIMITED CHEST ULTRASOUND COMPARISON:  CXR 04/23/24, 04/29/24 FINDINGS: Limited ultrasound examination of the right and left chest shows no pleural fluid. IMPRESSION: Bilateral chest ultrasound without pleural fluid. No procedure performed. Electronically Signed   By: Ester Sides M.D.   On: 05/01/2024 11:33  DG CHEST PORT 1 VIEW Result Date: 04/29/2024 CLINICAL DATA:  Shortness of breath EXAM: PORTABLE CHEST 1 VIEW COMPARISON:  04/23/2024 FINDINGS: Heart is normal size. Moderate left pleural effusion. Mild vascular congestion and bilateral perihilar and lower lobe airspace opacities. No acute bony abnormality. IMPRESSION: Perihilar and lower lobe  airspace opacities could reflect edema or infection. Moderate left pleural effusion. Electronically Signed   By: Franky Crease M.D.   On: 04/29/2024 14:41   CT ABDOMEN PELVIS WO CONTRAST Result Date: 04/26/2024 CLINICAL DATA:  Acute abdominal pain and urinary retention. EXAM: CT ABDOMEN AND PELVIS WITHOUT CONTRAST TECHNIQUE: Multidetector CT imaging of the abdomen and pelvis was performed following the standard protocol without IV contrast. RADIATION DOSE REDUCTION: This exam was performed according to the departmental dose-optimization program which includes automated exposure control, adjustment of the mA and/or kV according to patient size and/or use of iterative reconstruction technique. COMPARISON:  CT abdomen and pelvis 04/23/2024 FINDINGS: Lower chest: There some ground-glass opacities in both lung bases Hepatobiliary: The liver is enlarged. Gallbladder is surgically absent. There is no biliary ductal dilatation. Pancreas: There is fatty infiltration of the pancreas. No pancreatic ductal dilatation or surrounding inflammatory changes. Spleen: Normal in size without focal abnormality. Adrenals/Urinary Tract: The kidneys and adrenal glands appear within normal limits. Bladder is decompressed by Foley catheter. Stomach/Bowel: Stomach is within normal limits. Appendix appears normal. There some wall thickening of central small bowel loops. Jejunal loops just proximal to this level are mildly dilated with air-fluid levels. There is no inflammatory stranding. The stomach is nondilated. The colon appears normal. No pneumatosis or free air. Vascular/Lymphatic: Aortic atherosclerosis. No enlarged abdominal or pelvic lymph nodes. Reproductive: Uterus and bilateral adnexa are unremarkable. Other: No abdominal wall hernia or abnormality. No abdominopelvic ascites. Musculoskeletal: The bones are diffusely osteopenic. There stable compression fractures of L1, L3, L4, L5, and T11. there cystic changes and mild flattening  of the left femoral head similar to the prior study compatible with avascular necrosis. IMPRESSION: 1. Mild wall thickening of central small bowel loops with mild dilatation of jejunal loops just proximal to this level. Findings may be related to enteritis. Early/partial small bowel obstruction not excluded. 2. Ground-glass opacities in both lung bases may be infectious/inflammatory. 3. Hepatomegaly. 4. Stable compression fractures of the thoracolumbar spine. 5. 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 Aortic Atherosclerosis (ICD10-I70.0). Electronically Signed   By: Greig Pique M.D.   On: 04/26/2024 19:10   ECHOCARDIOGRAM COMPLETE Result Date: 04/24/2024    ECHOCARDIOGRAM REPORT   Patient Name:   Kathleen [name] Date of Exam: 04/24/2024 Medical Rec #:  [id number]      Height:       70.0 in Accession #:    [id number]     Weight:       262.8 lb Date of Birth:  Jan 14, 1961      BSA:          2.344 m Patient Age:    63 years       BP:           114/57 mmHg Patient Gender: F              HR:           87 bpm. Exam Location:  [name] Procedure: 2D Echo, Cardiac Doppler and Color Doppler (Both Spectral and Color            Flow Doppler were utilized during procedure). Indications:    Leg  Edema  History:        Patient has prior history of Echocardiogram examinations, most                 recent 10/15/2016. Risk Factors:Dyslipidemia, Sleep Apnea and                 Prediabetic. Hx of sarcoidosis.  Sonographer:    Aida Pizza RCS Referring Phys: [id number] DAWOOD S SHERLON  Sonographer Comments: Somewhat difficult subcostal windows and suprasternal window. IMPRESSIONS  1. Left ventricular ejection fraction, by estimation, is 60 to 65%. The left ventricle has normal function. Left ventricular endocardial border not optimally defined to evaluate regional wall motion. There is mild left ventricular hypertrophy. Left ventricular diastolic parameters are consistent with Grade I diastolic dysfunction (impaired  relaxation). Elevated left ventricular end-diastolic pressure.  2. Right ventricular systolic function is normal. The right ventricular size is mildly enlarged. Tricuspid regurgitation signal is inadequate for assessing PA pressure.  3. The mitral valve is normal in structure. No evidence of mitral valve regurgitation. No evidence of mitral stenosis.  4. The aortic valve has an indeterminant number of cusps. Aortic valve regurgitation is not visualized. No aortic stenosis is present.  5. The inferior vena cava is normal in size with greater than 50% respiratory variability, suggesting right atrial pressure of 3 mmHg. Comparison(s): No prior Echocardiogram. FINDINGS  Left Ventricle: Left ventricular ejection fraction, by estimation, is 60 to 65%. The left ventricle has normal function. Left ventricular endocardial border not optimally defined to evaluate regional wall motion. Strain was performed and the global longitudinal strain is indeterminate. The left ventricular internal cavity size was normal in size. There is mild left ventricular hypertrophy. Left ventricular diastolic parameters are consistent with Grade I diastolic dysfunction (impaired relaxation).  Elevated left ventricular end-diastolic pressure. Right Ventricle: The right ventricular size is mildly enlarged. No increase in right ventricular wall thickness. Right ventricular systolic function is normal. Tricuspid regurgitation signal is inadequate for assessing PA pressure. Left Atrium: Left atrial size was normal in size. Right Atrium: Right atrial size was normal in size. Pericardium: There is no evidence of pericardial effusion. Mitral Valve: The mitral valve is normal in structure. No evidence of mitral valve regurgitation. No evidence of mitral valve stenosis. Tricuspid Valve: The tricuspid valve is normal in structure. Tricuspid valve regurgitation is not demonstrated. No evidence of tricuspid stenosis. Aortic Valve: The aortic valve has an  indeterminant number of cusps. Aortic valve regurgitation is not visualized. No aortic stenosis is present. Aortic valve mean gradient measures 7.0 mmHg. Aortic valve peak gradient measures 12.7 mmHg. Aortic valve area, by VTI measures 1.87 cm. Pulmonic Valve: The pulmonic valve was grossly normal. Pulmonic valve regurgitation is not visualized. No evidence of pulmonic stenosis. Aorta: The aortic root is normal in size and structure. Venous: The inferior vena cava is normal in size with greater than 50% respiratory variability, suggesting right atrial pressure of 3 mmHg. IAS/Shunts: No atrial level shunt detected by color flow Doppler. Additional Comments: 3D was performed not requiring image post processing on an independent workstation and was indeterminate.  LEFT VENTRICLE PLAX 2D LVIDd:         5.00 cm   Diastology LVIDs:         2.90 cm   LV e' medial:    5.47 cm/s LV PW:         1.20 cm   LV E/e' medial:  17.6 LV IVS:  1.10 cm   LV e' lateral:   9.32 cm/s LVOT diam:     1.90 cm   LV E/e' lateral: 10.3 LV SV:         65 LV SV Index:   28 LVOT Area:     2.84 cm  RIGHT VENTRICLE RV S prime:     11.70 cm/s TAPSE (M-mode): 1.8 cm LEFT ATRIUM             Index        RIGHT ATRIUM           Index LA diam:        3.80 cm 1.62 cm/m   RA Area:     13.70 cm LA Vol (A2C):   34.2 ml 14.59 ml/m  RA Volume:   34.60 ml  14.76 ml/m LA Vol (A4C):   45.4 ml 19.37 ml/m LA Biplane Vol: 41.9 ml 17.87 ml/m  AORTIC VALVE AV Area (Vmax):    2.05 cm AV Area (Vmean):   1.72 cm AV Area (VTI):     1.87 cm AV Vmax:           178.00 cm/s AV Vmean:          127.000 cm/s AV VTI:            0.348 m AV Peak Grad:      12.7 mmHg AV Mean Grad:      7.0 mmHg LVOT Vmax:         129.00 cm/s LVOT Vmean:        77.200 cm/s LVOT VTI:          0.230 m LVOT/AV VTI ratio: 0.66  AORTA Ao Root diam: 3.40 cm MITRAL VALVE MV Area (PHT): 2.91 cm    SHUNTS MV Decel Time: 261 msec    Systemic VTI:  0.23 m MV E velocity: 96.40 cm/s  Systemic  Diam: 1.90 cm MV A velocity: 89.60 cm/s MV E/A ratio:  1.08 Vishnu Priya Mallipeddi Electronically signed by Diannah Late Mallipeddi Signature Date/Time: 04/24/2024/3:50:25 PM    Final    US  Venous Img Lower Bilateral (DVT) Result Date: 04/23/2024 CLINICAL DATA:  Bilateral lower extremity edema EXAM: BILATERAL LOWER EXTREMITY VENOUS DOPPLER ULTRASOUND TECHNIQUE: Gray-scale sonography with graded compression, as well as color Doppler and duplex ultrasound were performed to evaluate the lower extremity deep venous systems from the level of the common femoral vein and including the common femoral, femoral, profunda femoral, popliteal and calf veins including the posterior tibial, peroneal and gastrocnemius veins when visible. The superficial great saphenous vein was also interrogated. Spectral Doppler was utilized to evaluate flow at rest and with distal augmentation maneuvers in the common femoral, femoral and popliteal veins. COMPARISON:  None Available. FINDINGS: RIGHT LOWER EXTREMITY Common Femoral Vein: No evidence of thrombus. Normal compressibility, respiratory phasicity and response to augmentation. Saphenofemoral Junction: No evidence of thrombus. Normal compressibility and flow on color Doppler imaging. Profunda Femoral Vein: No evidence of thrombus. Normal compressibility and flow on color Doppler imaging. Femoral Vein: No evidence of thrombus. Normal compressibility, respiratory phasicity and response to augmentation. Popliteal Vein: No evidence of thrombus. Normal compressibility, respiratory phasicity and response to augmentation. Calf Veins: No evidence of thrombus. Normal compressibility and flow on color Doppler imaging. Superficial Great Saphenous Vein: No evidence of thrombus. Normal compressibility. Venous Reflux:  None. Other Findings:  None. LEFT LOWER EXTREMITY Common Femoral Vein: No evidence of thrombus. Normal compressibility, respiratory phasicity and response to augmentation.  Saphenofemoral Junction:  No evidence of thrombus. Normal compressibility and flow on color Doppler imaging. Profunda Femoral Vein: No evidence of thrombus. Normal compressibility and flow on color Doppler imaging. Femoral Vein: No evidence of thrombus. Normal compressibility, respiratory phasicity and response to augmentation. Popliteal Vein: No evidence of thrombus. Normal compressibility, respiratory phasicity and response to augmentation. Calf Veins: No evidence of thrombus. Normal compressibility and flow on color Doppler imaging. Superficial Great Saphenous Vein: No evidence of thrombus. Normal compressibility. Venous Reflux:  None. Other Findings:  None. IMPRESSION: No evidence of deep venous thrombosis in either lower extremity. Electronically Signed   By: Wilkie Lent M.D.   On: 04/23/2024 16:46     Time coordinating discharge: Over 30 minutes    Alm Apo, MD  [facility] 05/23/2024, 3:55 PM

## 2024-05-23 NOTE — Progress Notes (Addendum)
 Progress Note    Kathleen [name]   FMW:[id number]  DOB: [birth year]  DOA: 05/11/2024     12 PCP: Alphonsa Glendia LABOR, MD  Initial CC: SOB  Hospital Course: [name] is a 63 y.o. 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  She has chronic hypoxic tori failure on supplemental oxygen .. 9/19> transferred from [facility] to [facility] for advanced heart failure team evaluation 9/22: s/p RHC-no pulmonary hypertension. Volume status stabilized, patient now on oral torsemide , cardiology team signed off.   Assessment and plan  Acute on chronic HFpEF Left small effusion/pulmonary congestion Chronic dyspnea Recent admissions for acute on chronic HFpEF Mild hypokalemia from diuresis PTA 22 LB weight gain w/ recent echo August EF 60 to 65% indeterminate diastolic function, elevated PASP in past. Managed with diuresis, transferred to [facility] for AHF team eval Recent pulmonary eval at [facility] requesting RHC to eval for underlying pulmonary hypertension given her sarcoid/ILD With aggressive diuresis: About 30 pound weight loss and remains euvolemic, Lasix  discontinued 9/21> resumed torsemide  po 9/23 RHC 9/22: Mildly elevated right-sided filling pressure no significant pulmonary hypertension normal PCWP preserved cardiac output Cardiology has signed off plan is to discharge  New onset PA-fib: Heparin  has been switched to Eliquis .She had  history of A-fib when she was transferred to [facility] in 2017 but had converted to sinus rhythm upon transfer.  Watch for any kind of fall risk and adjust as OP   Pulmonary sarcoidosis/ILD Chronic hypoxic respiratory failure on 5 L [place]: Pulmonary status remains tenuous, continue home oxygen  setting RHC finding with normal pulmonary pressure as above.   she is followed by [name]  has been stopped, cannot tolerate methotrexate due to diarrhea hair loss, cannot tolerate CellCept due to diarrhea>advised pulmonary rehab. Last CT chest 5/26 /25> showed similar appearing chronic bilateral upper lobe paramediastinal architectural distortion, bronchiectasis and peribronchial consolidation/scaring  Normocytic anemia: Suspect from chronic illness hemoglobin stable 9 to 10 g/dL  Lethargy Weakness: Mental status improved stable. Minimize opioid/pain meds continue PT OT   Goals of care Bed bound w/ dyspnea Ambulatory dysfunction Frequent hospitalizations-she had 9 hospitalizations since January this year Overall poor prognosis, question probably has progression of the disease.  Patient was transferred for cardiac cath RHC without significant pulmonary hypertension.Palliative care continues to follow along. She is at full code but I am very concerned , overall prognosis does not appear bright  OSA Not using CPAP  Chronic pain syndrome Anxiety/depression Pain controlled mood stable continue home meds including -BuSpar , Topamax , oxycodone   Adrenal insufficiency Hypothyroidism Chronic hypotension Continue her synthroid , midodrine  and Cortef  bid.  Received IV Solu-Medrol  this admission  HLD Continue statin  Constipation continue Linzess , MiraLAX .  Last bowel movement 9/20  Class I Obesity w/ Body mass index is 35.78 kg/m. Will benefit with PCP follow-up, weight loss,healthy lifestyle  Interval History:  No events overnight.  Insurance denied CIR.  Plan is home with home health at this  time.  Assessment and Plan: * Acute on chronic heart failure  with preserved ejection fraction (HFpEF) (HCC) Presenting with dyspnea, 2+ pitting bilateral lower extremity edema, weight gain per charts 22 pounds since recent hospitalization.  X-ray shows pulmonary vascular congestion.  BNP-53.  Likely not reflective of volume status due to habitus.  No increased O2 requirements.  Last echo 04/24/2024 EF of 60 to 65%, grade 1 DD.  Reports compliance with Lasix  20 mg daily.  Recent hospitalization for similar. -IV Lasix  80 mg Given, Continue 40 q12h - Strict input output Daily weights   Chronic respiratory failure with hypoxia (HCC) On 5 L at baseline, O2 sats currently 90 to 99%.  Interstitial lung disease (HCC) History of pulmonary hypertension, sarcoidosis, on chronic 5 L O2 at baseline. -125 mg Solu-Medrol  given in ED of dyspnea. -Further dosing pending clinical course.  Anxiety and depression Resume Cymbalta , buspirone .  Adrenal insufficiency (Addison's disease) (HCC) Blood pressure stable. -Resume Solu-Cortef , midodrine . -Doubt need for stress dose steroids at this time.  Hypothyroidism Resume Synthroid   Chronic pain syndrome Resume PTA oxycodone  50 mg every 4 hourly as needed  Obstructive sleep apnea Not on CPAP.    Old records reviewed in assessment of this patient  Antimicrobials:   DVT prophylaxis:   apixaban  (ELIQUIS ) tablet 5 mg   Code Status:   Code Status: Full Code  Mobility Assessment (Last 72 Hours)     Mobility Assessment     Row Name 05/22/24 2030 05/22/24 0824 05/21/24 1942 05/21/24 1600 05/21/24 1100   Does the patient have exclusion criteria? No - Perform mobility assessment No - Perform mobility assessment No - Perform mobility assessment No - Perform mobility assessment --   What is the highest level of mobility based on the mobility assessment? Level 4 (Ambulates with assistance) - Balance while stepping forward/back - Complete Level 4  (Ambulates with assistance) - Balance while stepping forward/back - Complete Level 4 (Ambulates with assistance) - Balance while stepping forward/back - Complete -- Level 4 (Ambulates with assistance) - Balance while stepping forward/back - Complete   Is the above level different from baseline mobility prior to current illness? Yes - Recommend PT order Yes - Recommend PT order Yes - Recommend PT order Yes - Recommend PT order --    Row Name 05/21/24 0900 05/20/24 2000 05/20/24 1600 05/20/24 1300     Does the patient have exclusion criteria? No - Perform mobility assessment No - Perform mobility assessment No - Perform mobility assessment --    What is the highest level of mobility based on the mobility assessment? Level 4 (Ambulates with assistance) - Balance while stepping forward/back - Complete Level 4 (Ambulates with assistance) - Balance while stepping forward/back - Complete -- Level 4 (Ambulates with assistance) - Balance while stepping forward/back - Complete    Is the above level different from baseline mobility prior to current illness? Yes - Recommend PT order Yes - Recommend PT order Yes - Recommend PT order --       Barriers to discharge: None Disposition Plan: Possible CIR HH orders placed: N/A Status is: Inpatient  Objective: Blood pressure (!) 119/46, pulse 67, temperature 98 F (36.7 C), temperature source Oral, resp. rate 15, height 5' 10 (1.778 m), weight 115 kg, last menstrual period 05/17/2016, SpO2 100%.  Examination:  Physical Exam Constitutional:      Appearance: Normal appearance.  HENT:     Head: Normocephalic and atraumatic.     Mouth/Throat:     Mouth: Mucous membranes are moist.  Eyes:     Extraocular Movements:  Extraocular movements intact.  Cardiovascular:     Rate and Rhythm: Normal rate and regular rhythm.  Pulmonary:     Effort: Pulmonary effort is normal. No respiratory distress.     Breath sounds: No wheezing.     Comments: Scattered coarse  breath sounds worse bibasilar Abdominal:     General: Bowel sounds are normal. There is no distension.     Palpations: Abdomen is soft.     Tenderness: There is no abdominal tenderness.  Musculoskeletal:        General: Normal range of motion.     Cervical back: Normal range of motion and neck supple.     Right lower leg: Edema (trace/1+) present.     Left lower leg: Edema (trace / 1+) present.  Skin:    General: Skin is warm and dry.  Neurological:     General: No focal deficit present.     Mental Status: She is alert.  Psychiatric:        Mood and Affect: Mood normal.      Consultants:  Cardiology  Procedures:    Data Reviewed: No results found. However, due to the size of the patient record, not all encounters were searched. Please check Results Review for a complete set of results.  I have reviewed pertinent nursing notes, vitals, labs, and images as necessary. I have ordered labwork to follow up on as indicated.  I have reviewed the last notes from staff over past 24 hours. I have discussed patient's care plan and test results with nursing staff, CM/SW, and other staff as appropriate.  Time spent: Greater than 50% of the 55 minute visit was spent in counseling/coordination of care for the patient as laid out in the A&P.   LOS: 12 days   Alm Apo, MD [facility] 05/23/2024, 11:59 AM

## 2024-05-24 ENCOUNTER — Other Ambulatory Visit: Payer: Self-pay | Admitting: Family Medicine

## 2024-05-24 ENCOUNTER — Other Ambulatory Visit (HOSPITAL_COMMUNITY): Payer: Self-pay

## 2024-05-24 MED FILL — Hydrocortisone Tab 10 MG: ORAL | 30 days supply | Qty: 120 | Fill #3 | Status: AC

## 2024-05-25 ENCOUNTER — Other Ambulatory Visit (HOSPITAL_COMMUNITY): Payer: Self-pay

## 2024-05-25 MED ORDER — FERROUS SULFATE 325 (65 FE) MG PO TABS
325.0000 mg | ORAL_TABLET | ORAL | 0 refills | Status: DC
  Filled 2024-05-25: qty 4, 28d supply, fill #0

## 2024-05-27 ENCOUNTER — Telehealth: Payer: Self-pay

## 2024-05-27 ENCOUNTER — Other Ambulatory Visit (HOSPITAL_COMMUNITY): Payer: Self-pay

## 2024-05-27 NOTE — Telephone Encounter (Signed)
 Sorry that she is having back pain She does have pain management doctors I would recommend doing occupational therapy as tolerated but with the knowledge that she may not be able to tolerate a lot of activity

## 2024-05-27 NOTE — Telephone Encounter (Signed)
 Copied from CRM #[id number]. Topic: Clinical - Home Health Verbal Orders >> May 27, 2024 12:22 PM Terri MATSU wrote: Caller/Agency: Rosina from home health agency Callback Number: [phone] Service Requested: Occupational Therapy Frequency: 2x a week for 4weeks Any new concerns about the patient? Yes, 10 out 10 back pain

## 2024-05-28 NOTE — Telephone Encounter (Signed)
 See telephone note, called and left a message to relay verbal orders per drs notes.   Sorry that she is having back pain She does have pain management doctors I would recommend doing occupational therapy as tolerated but with the knowledge that she may not be able to tolerate a lot of activity

## 2024-05-29 ENCOUNTER — Other Ambulatory Visit: Payer: Self-pay

## 2024-05-29 ENCOUNTER — Other Ambulatory Visit (HOSPITAL_COMMUNITY): Payer: Self-pay

## 2024-05-29 ENCOUNTER — Encounter: Payer: Self-pay | Admitting: Family Medicine

## 2024-05-29 ENCOUNTER — Ambulatory Visit (INDEPENDENT_AMBULATORY_CARE_PROVIDER_SITE_OTHER): Payer: Self-pay | Admitting: Family Medicine

## 2024-05-29 VITALS — BP 110/64 | HR 84 | Temp 97.3°F | Ht 70.0 in | Wt 261.0 lb

## 2024-05-29 DIAGNOSIS — G894 Chronic pain syndrome: Secondary | ICD-10-CM | POA: Diagnosis not present

## 2024-05-29 DIAGNOSIS — J849 Interstitial pulmonary disease, unspecified: Secondary | ICD-10-CM | POA: Diagnosis not present

## 2024-05-29 DIAGNOSIS — R600 Localized edema: Secondary | ICD-10-CM | POA: Diagnosis not present

## 2024-05-29 MED ORDER — TORSEMIDE 20 MG PO TABS
40.0000 mg | ORAL_TABLET | Freq: Every morning | ORAL | 3 refills | Status: DC
  Filled 2024-05-29: qty 60, fill #0
  Filled 2024-06-01: qty 60, 30d supply, fill #0

## 2024-05-29 MED ORDER — POTASSIUM CHLORIDE CRYS ER 10 MEQ PO TBCR
10.0000 meq | EXTENDED_RELEASE_TABLET | Freq: Two times a day (BID) | ORAL | 5 refills | Status: DC
  Filled 2024-05-29: qty 60, 30d supply, fill #0
  Filled 2024-06-29: qty 60, 30d supply, fill #1
  Filled 2024-08-10: qty 60, 30d supply, fill #2
  Filled 2024-09-11: qty 60, 30d supply, fill #3

## 2024-05-29 MED ORDER — HYDROXYZINE HCL 10 MG PO TABS
10.0000 mg | ORAL_TABLET | Freq: Three times a day (TID) | ORAL | 1 refills | Status: DC | PRN
  Filled 2024-05-29: qty 45, 15d supply, fill #0
  Filled 2024-06-16: qty 45, 15d supply, fill #1

## 2024-05-29 NOTE — Progress Notes (Signed)
   Subjective:    Patient ID: Kathleen [name], female    DOB: 10/21/1960, 63 y.o.   MRN: [id number]  HPI  Hospital follow up - concerns with weight pain since hospital discharge As well as leg and foot swelling  Patient had significant setback with fluid overload as well as her pulmonary issues and also infection. Patient's long-term prognosis guarded She is a very nice patient under a lot of stress with her sickness Patient did have some urinary retention while in the hospital Patient experiences severe pain when she tries to stand and walk  Patient has her husband at home but is under a lot of stress trying to care for herself at home   Review of Systems     Objective:   Physical Exam  General-in no acute distress Eyes-no discharge Lungs-respiratory rate normal, CTA CV-no murmurs,RRR Extremities skin warm dry no edema Neuro grossly normal Behavior normal, alert Patient on oxygen       Assessment & Plan:  1. Pedal edema (Primary) Increase the dose of diuretic to 2 tablets Follow-up closely Recommend home health Patient has fragile health and would benefit from ongoing care - Basic Metabolic Panel (BMET)  2. Chronic pain syndrome Under the care of chronic pain management  3. Interstitial lung disease (HCC) Under the care of specialist at [facility]  4. Morbid (severe) obesity due to excess calories (HCC) As best she can portion control regular physical activity very difficult for this patient because of her underlying health issues

## 2024-05-29 NOTE — Telephone Encounter (Signed)
 Patient has an appointment this afternoon for hospital follow up

## 2024-05-30 ENCOUNTER — Other Ambulatory Visit: Payer: Self-pay

## 2024-05-30 ENCOUNTER — Emergency Department (HOSPITAL_COMMUNITY)

## 2024-05-30 ENCOUNTER — Emergency Department (HOSPITAL_COMMUNITY)
Admission: EM | Admit: 2024-05-30 | Discharge: 2024-05-31 | Disposition: A | Attending: Emergency Medicine | Admitting: Emergency Medicine

## 2024-05-30 ENCOUNTER — Encounter (HOSPITAL_COMMUNITY): Payer: Self-pay | Admitting: Emergency Medicine

## 2024-05-30 DIAGNOSIS — Z7901 Long term (current) use of anticoagulants: Secondary | ICD-10-CM | POA: Diagnosis not present

## 2024-05-30 DIAGNOSIS — R0600 Dyspnea, unspecified: Secondary | ICD-10-CM | POA: Diagnosis not present

## 2024-05-30 DIAGNOSIS — R079 Chest pain, unspecified: Secondary | ICD-10-CM | POA: Insufficient documentation

## 2024-05-30 DIAGNOSIS — R918 Other nonspecific abnormal finding of lung field: Secondary | ICD-10-CM | POA: Diagnosis not present

## 2024-05-30 DIAGNOSIS — R609 Edema, unspecified: Secondary | ICD-10-CM | POA: Diagnosis not present

## 2024-05-30 DIAGNOSIS — R0602 Shortness of breath: Secondary | ICD-10-CM | POA: Diagnosis not present

## 2024-05-30 LAB — BASIC METABOLIC PANEL WITH GFR
BUN/Creatinine Ratio: 7 — ABNORMAL LOW (ref 12–28)
BUN: 6 mg/dL — ABNORMAL LOW (ref 8–27)
CO2: 31 mmol/L — ABNORMAL HIGH (ref 20–29)
Calcium: 9.3 mg/dL (ref 8.7–10.3)
Chloride: 97 mmol/L (ref 96–106)
Creatinine, Ser: 0.85 mg/dL (ref 0.57–1.00)
Glucose: 132 mg/dL — ABNORMAL HIGH (ref 70–99)
Potassium: 4.1 mmol/L (ref 3.5–5.2)
Sodium: 143 mmol/L (ref 134–144)
eGFR: 78 mL/min/1.73 (ref >59)

## 2024-05-30 LAB — CBC
HCT: 25.6 % — ABNORMAL LOW (ref 36.0–46.0)
Hemoglobin: 7.7 g/dL — ABNORMAL LOW (ref 12.0–15.0)
MCH: 27.4 pg (ref 26.0–34.0)
MCHC: 30.1 g/dL (ref 30.0–36.0)
MCV: 91.1 fL (ref 80.0–100.0)
Platelets: 345 K/uL (ref 150–400)
RBC: 2.81 MIL/uL — ABNORMAL LOW (ref 3.87–5.11)
RDW: 17.8 % — ABNORMAL HIGH (ref 11.5–15.5)
WBC: 7.8 K/uL (ref 4.0–10.5)
nRBC: 0 % (ref 0.0–0.2)

## 2024-05-30 MED ORDER — HYDROCORTISONE SOD SUC (PF) 100 MG IJ SOLR
100.0000 mg | Freq: Once | INTRAMUSCULAR | Status: AC
  Administered 2024-05-30: 100 mg via INTRAVENOUS
  Filled 2024-05-30: qty 2

## 2024-05-30 NOTE — ED Triage Notes (Signed)
 Pr arrives W/ [facility] from home w/ c/o chest pain radiating to back x 2 days. Also c/o SHOB. 5L baseline. Reports hx AFIB & CHF Takes eliquis  daily  93/45 BP  All other VSS C/o back pain.

## 2024-05-31 LAB — BASIC METABOLIC PANEL WITH GFR
Anion gap: 12 (ref 5–15)
BUN: 8 mg/dL (ref 8–23)
CO2: 34 mmol/L — ABNORMAL HIGH (ref 22–32)
Calcium: 9 mg/dL (ref 8.9–10.3)
Chloride: 99 mmol/L (ref 98–111)
Creatinine, Ser: 1.11 mg/dL — ABNORMAL HIGH (ref 0.44–1.00)
GFR, Estimated: 56 mL/min — ABNORMAL LOW
Glucose, Bld: 147 mg/dL — ABNORMAL HIGH (ref 70–99)
Potassium: 3.4 mmol/L — ABNORMAL LOW (ref 3.5–5.1)
Sodium: 144 mmol/L (ref 135–145)

## 2024-05-31 LAB — HEPATIC FUNCTION PANEL
ALT: 19 U/L (ref 0–44)
AST: 19 U/L (ref 15–41)
Albumin: 3.3 g/dL — ABNORMAL LOW (ref 3.5–5.0)
Alkaline Phosphatase: 66 U/L (ref 38–126)
Bilirubin, Direct: 0.1 mg/dL (ref 0.0–0.2)
Total Bilirubin: <0.2 mg/dL (ref 0.0–1.2)
Total Protein: 5.4 g/dL — ABNORMAL LOW (ref 6.5–8.1)

## 2024-05-31 LAB — TROPONIN T, HIGH SENSITIVITY
Troponin T High Sensitivity: <15 ng/L (ref 0–19)
Troponin T High Sensitivity: <15 ng/L (ref 0–19)

## 2024-05-31 LAB — PRO BRAIN NATRIURETIC PEPTIDE: Pro Brain Natriuretic Peptide: 192 pg/mL (ref <300.0)

## 2024-05-31 MED ORDER — POTASSIUM CHLORIDE 10 MEQ/100ML IV SOLN
10.0000 meq | Freq: Once | INTRAVENOUS | Status: AC
  Administered 2024-05-31: 10 meq via INTRAVENOUS
  Filled 2024-05-31: qty 100

## 2024-05-31 MED ORDER — FUROSEMIDE 10 MG/ML IJ SOLN
80.0000 mg | Freq: Once | INTRAMUSCULAR | Status: AC
  Administered 2024-05-31: 80 mg via INTRAVENOUS
  Filled 2024-05-31: qty 8

## 2024-05-31 NOTE — ED Notes (Signed)
 Pt repositioned in bed. Warm blanket given

## 2024-05-31 NOTE — ED Notes (Signed)
 Pt c/o potassium burning although LR is running w/ infusion. Potassium turned down to 16mL/hr and LR turned up to 150/hr

## 2024-05-31 NOTE — ED Notes (Signed)
 Pt given diet coke per her request.

## 2024-05-31 NOTE — ED Provider Notes (Signed)
 [place] EMERGENCY DEPARTMENT AT [facility] Provider Note   CSN: [id number] Arrival date & time: 05/30/24  2236     Patient presents with: Chest Pain and Shortness of Breath   Kathleen [name] is a 63 y.o. female.   The patient is presenting with urinary retention and chest pain. She reports a history of being unable to urinate, which previously required Foley catheter placement, resulting in significant fluid removal. She was hospitalized three weeks ago for similar symptoms and underwent a cardiac catheterization. The patient has been diagnosed with a paralyzed bladder, attributed to a past episode of shingles. She notes that she can urinate when fluid retention is not present, but currently, she is unable to do so. The patient is experiencing severe pain in her feet, making it difficult to stand. She has been advised to seek emergency care if she gains two pounds in 24 hours or five pounds in a week. She is currently taking torsemide , which was recently increased to two tablets in the morning, but she reports it is not effective. The patient also experiences intermittent chest pain that starts centrally and radiates to her back. She has a history of a vertebral fracture due to chronic prednisone  use for sarcoidosis in her lungs, prescribed since 2013. The chest pain is described as wrapping around from the chest to the back. History was obtained from the patient.   Chest Pain Associated symptoms: shortness of breath   Shortness of Breath Associated symptoms: chest pain        Prior to Admission medications   Medication Sig Start Date End Date Taking? Authorizing Provider  acetaminophen  (TYLENOL ) 325 MG tablet Take 2 tablets (650 mg total) by mouth every 6 (six) hours as needed for mild pain (pain score 1-3) or fever (or Fever >/= 101). 02/19/24   Angiulli, Toribio PARAS, PA-C  alendronate  (FOSAMAX ) 70 MG tablet Take 1 tablet (70 mg total) by mouth every Saturday. Take with a full  glass of water  on an empty stomach. 03/14/24   Alphonsa Glendia LABOR, MD  apixaban  (ELIQUIS ) 5 MG TABS tablet Take 1 tablet (5 mg total) by mouth 2 (two) times daily. 05/20/24   Christobal Guadalajara, MD  busPIRone  (BUSPAR ) 15 MG tablet Take 1 tablet (15 mg) by mouth 3 times daily. 03/09/24   Alphonsa Glendia LABOR, MD  Calcium  Citrate 333 MG TABS Take one tablet by mouth 3 (three) times daily with meals. 03/17/24   Nida, Gebreselassie W, MD  Capsaicin-Menthol -Methyl Sal (CAPSAICIN-METHYL SAL-MENTHOL ) 0.025-1-12 % CREA Apply 1 Application topically 3 (three) times daily as needed. 05/23/24   Patsy Lenis, MD  ferrous sulfate  (FEROSUL) 325 (65 FE) MG tablet Take 1 tablet (325 mg total) by mouth once a week. 05/25/24   Alphonsa Glendia LABOR, MD  folic acid  (FOLVITE ) 1 MG tablet Take 1 tablet (1 mg total) by mouth daily. 05/04/24   Alphonsa Glendia LABOR, MD  hydrocortisone  (CORTEF ) 10 MG tablet Take 2 tablets by mouth at 8:00 am and 1 tablet at lunch. (May double dose on sick days x 3-5 days) 11/14/23   Nida, Gebreselassie W, MD  hydrOXYzine  (ATARAX ) 10 MG tablet Take 1 tablet (10 mg total) by mouth 3 (three) times daily as needed for anxiety 05/29/24   Alphonsa Glendia LABOR, MD  levothyroxine  (SYNTHROID ) 50 MCG tablet Take 1 tablet by mouth daily before breakfast. 03/09/24 09/05/24  Alphonsa Glendia LABOR, MD  linaclotide  (LINZESS ) 145 MCG CAPS capsule Take 1 capsule (145 mcg total) by mouth  daily before breakfast. 03/09/24   Alphonsa Glendia LABOR, MD  midodrine  (PROAMATINE ) 5 MG tablet Take 1 tablet (5 mg total) by mouth 2 (two) times daily with a meal. 03/09/24   Luking, Glendia LABOR, MD  ondansetron  (ZOFRAN -ODT) 8 MG disintegrating tablet Dissolve 1 tablet (8 mg total) by mouth every 8 (eight) hours as needed. Patient taking differently: Take 8 mg by mouth every 8 (eight) hours as needed for nausea or vomiting. 05/04/24 05/04/25  Alphonsa Glendia LABOR, MD  oxyCODONE  (ROXICODONE ) 15 MG immediate release tablet Take 1 tablet (15 mg total) by mouth every 4 (four) hours as needed for  pain 05/07/24     OXYGEN  Inhale 5 L into the lungs continuous.    [provider]  pantoprazole  (PROTONIX ) 40 MG tablet Take 1 tablet (40 mg total) by mouth 2 (two) times daily. 03/09/24   Alphonsa Glendia LABOR, MD  polyethylene glycol (MIRALAX  / GLYCOLAX ) 17 g packet Take 17 g by mouth 2 (two) times daily. 01/23/24   Pearlean Manus, MD  potassium chloride  (KLOR-CON  M) 10 MEQ tablet Take 1 tablet (10 mEq total) by mouth 2 (two) times daily. 05/29/24   Alphonsa Glendia LABOR, MD  revefenacin  (YUPELRI ) 175 MCG/3ML nebulizer solution Take 3 mLs (175 mcg total) by nebulization daily. 02/24/24   Angiulli, Toribio PARAS, PA-C  rosuvastatin  (CRESTOR ) 20 MG tablet Take 1 tablet (20 mg total) by mouth daily. 03/09/24   Alphonsa Glendia LABOR, MD  senna-docusate (SENOKOT-S) 8.6-50 MG tablet Take 2 tablets by mouth at bedtime. 01/23/24   Pearlean Manus, MD  topiramate  (TOPAMAX ) 50 MG tablet Take 1 tablet (50 mg total) by mouth daily with lunch. 03/17/24   Nida, Ethelle ORN, MD  torsemide  (DEMADEX ) 20 MG tablet Take 2 tablets (40 mg total) by mouth in the morning. 05/29/24   Alphonsa Glendia LABOR, MD  Vitamin D , Ergocalciferol , (DRISDOL ) 1.25 MG (50000 UNIT) CAPS capsule Take 1 capsule (50,000 Units total) by mouth every Sunday. 03/01/24   Angiulli, Toribio PARAS, PA-C    Allergies: Bee venom, Contrast media [iodinated contrast media], Bactrim  [sulfamethoxazole -trimethoprim ], and Codeine    Review of Systems  Respiratory:  Positive for shortness of breath.   Cardiovascular:  Positive for chest pain.    Updated Vital Signs BP 130/73   Pulse 85   Temp 98.6 F (37 C) (Oral)   Resp 18   LMP 05/17/2016   SpO2 98%   Physical Exam Vitals and nursing note reviewed.  Constitutional:      Appearance: She is well-developed.     Comments: Morbid obesity  HENT:     Head: Normocephalic and atraumatic.  Cardiovascular:     Rate and Rhythm: Normal rate and regular rhythm.  Pulmonary:     Effort: No respiratory distress.     Breath  sounds: No stridor. No decreased breath sounds.  Abdominal:     General: There is no distension.  Musculoskeletal:     Cervical back: Normal range of motion.  Neurological:     Mental Status: She is alert.     (all labs ordered are listed, but only abnormal results are displayed) Labs Reviewed  BASIC METABOLIC PANEL WITH GFR - Abnormal; Notable for the following components:      Result Value   Potassium 3.4 (*)    CO2 34 (*)    Glucose, Bld 147 (*)    Creatinine, Ser 1.11 (*)    GFR, Estimated 56 (*)    All other components within normal limits  CBC - Abnormal; Notable for the following components:   RBC 2.81 (*)    Hemoglobin 7.7 (*)    HCT 25.6 (*)    RDW 17.8 (*)    All other components within normal limits  HEPATIC FUNCTION PANEL - Abnormal; Notable for the following components:   Total Protein 5.4 (*)    Albumin 3.3 (*)    All other components within normal limits  PRO BRAIN NATRIURETIC PEPTIDE  TROPONIN T, HIGH SENSITIVITY  TROPONIN T, HIGH SENSITIVITY    EKG: EKG Interpretation Date/Time:  Saturday May 30 2024 22:50:53 EDT Ventricular Rate:  86 PR Interval:  134 QRS Duration:  60 QT Interval:  490 QTC Calculation: 587 R Axis:   66  Text Interpretation: Sinus rhythm Low voltage, precordial leads Abnormal R-wave progression, early transition Prolonged QT interval Confirmed by Lorette Mayo [id number]) on 05/31/2024 12:16:14 AM  Radiology: [facility] Chest 2 View Result Date: 05/30/2024 CLINICAL DATA:  Chest pain. EXAM: CHEST - 2 VIEW COMPARISON:  05/12/2024 FINDINGS: Soft tissue attenuation obscures lower lung zone evaluation. Stable heart size and mediastinal contours allowing for differences in positioning. Chronic interstitial coarsening. Increased bronchial thickening. No significant pleural effusion. No pneumothorax. More detailed assessment is limited by soft tissue attenuation from habitus. IMPRESSION: 1. Increased bronchial thickening, can be seen with bronchitis  or reactive airways disease. 2. Chronic interstitial coarsening. Electronically Signed   By: Andrea Gasman M.D.   On: 05/30/2024 23:25     Procedures   Medications Ordered in the ED  hydrocortisone  sodium succinate  (SOLU-CORTEF ) 100 MG injection 100 mg (100 mg Intravenous Given 05/30/24 2350)  furosemide  (LASIX ) injection 80 mg (80 mg Intravenous Given 05/31/24 0106)  potassium chloride  10 mEq in 100 mL IVPB (0 mEq Intravenous Stopped 05/31/24 0318)                                    Medical Decision Making Amount and/or Complexity of Data Reviewed Labs: ordered. Radiology: ordered.  Risk Prescription drug management.   Patient presents the ER secondary to concern for fluid overload.  She states she is gained 2 pounds in 24 hours and was told to come to the ER if this happens.  She states she short of breath.  She also has some chest pain but is related to her chronic back pain from a vertebral body fracture diagnosed a while ago.  She is not hypoxic on her home oxygen .  She is not tachypneic.  Her lungs are clear.  Her physical exam is not consistent with fluid overload or anasarca as it was previously.  A dose of Lasix  was given even though her BNP was normal and chest x-ray look normal.  She did diurese quite a bit.  She requested a Foley catheter as she thought she is retaining urine however she ended up only having 350 mL in there.  This will be removed prior to discharge.  Troponin is negative making ACS unlikely.  Low suspicion for PE.  Overall I feel like patient is at her baseline and there is no indication for further workup or hospitalization at this time.    Final diagnoses:  Dyspnea, unspecified type    ED Discharge Orders     None          Tesslyn Baumert, Mayo, MD 05/31/24 [phone]

## 2024-05-31 NOTE — ED Notes (Signed)
 Pt reports she feels like she needs a foley catheter. Reports she always gets a catheter & needs one now. Bladder scan order in place.

## 2024-05-31 NOTE — ED Notes (Signed)
 Foley bag emptied. drained out of bag.

## 2024-05-31 NOTE — ED Notes (Signed)
Pt given diet coke per request 

## 2024-06-01 ENCOUNTER — Other Ambulatory Visit (HOSPITAL_COMMUNITY): Payer: Self-pay

## 2024-06-01 ENCOUNTER — Telehealth: Payer: Self-pay | Admitting: Family Medicine

## 2024-06-01 ENCOUNTER — Other Ambulatory Visit: Payer: Self-pay

## 2024-06-01 NOTE — Telephone Encounter (Signed)
 Nurses Patient was just recently in the ER Prior to that she was in the hospital multiple days and saw me on Friday  She needs to do a follow-up CBC with ferritin, TIBC, iron  as well as metabolic 7 Diagnosis pedal edema, high risk med, iron  deficient anemia She needs to do this blood work next week-she just had blood work from the hospital a couple days ago  Please also find out from the patient if she has home health coming to see her currently?  If she does not have a home health it would be reasonable to consult home health due to her current status  (Diagnosis fluid overload, pedal edema, chronic lung disease with hypoxia and respiratory failure)  She has a follow-up visit with me toward the end of the month please keep thanks-Dr. Glendia

## 2024-06-02 ENCOUNTER — Other Ambulatory Visit (HOSPITAL_COMMUNITY): Payer: Self-pay

## 2024-06-03 ENCOUNTER — Telehealth: Payer: Self-pay | Admitting: *Deleted

## 2024-06-03 ENCOUNTER — Other Ambulatory Visit (HOSPITAL_BASED_OUTPATIENT_CLINIC_OR_DEPARTMENT_OTHER): Payer: Self-pay

## 2024-06-03 ENCOUNTER — Other Ambulatory Visit: Payer: Self-pay

## 2024-06-03 ENCOUNTER — Other Ambulatory Visit: Payer: Self-pay | Admitting: Family Medicine

## 2024-06-03 ENCOUNTER — Other Ambulatory Visit (HOSPITAL_COMMUNITY): Payer: Self-pay

## 2024-06-03 DIAGNOSIS — Z79899 Other long term (current) drug therapy: Secondary | ICD-10-CM

## 2024-06-03 DIAGNOSIS — R600 Localized edema: Secondary | ICD-10-CM

## 2024-06-03 DIAGNOSIS — D509 Iron deficiency anemia, unspecified: Secondary | ICD-10-CM

## 2024-06-03 MED ORDER — TORSEMIDE 20 MG PO TABS
ORAL_TABLET | ORAL | 5 refills | Status: AC
  Filled 2024-06-03: qty 90, 90d supply, fill #0
  Filled 2024-06-16 (×2): qty 90, 30d supply, fill #0
  Filled 2024-07-21: qty 90, 30d supply, fill #1
  Filled 2024-08-31: qty 90, 30d supply, fill #2
  Filled 2024-09-22 – 2024-09-23 (×2): qty 90, 30d supply, fill #3

## 2024-06-03 NOTE — Telephone Encounter (Signed)
See other message duplicate

## 2024-06-03 NOTE — Telephone Encounter (Signed)
 May go ahead with additional dose of torsemide  1 tablet this afternoon Recommend follow-up lab work within 7 days lab work was ordered if necessary have home health draw at home  So essentially 2 tablets each morning as directed of the torsemide  May take 1 additional tablet at 1 PM on any day that the patient inadvertently gains 2 pounds  It is important to use the same scale and same technique and time a day when taking a weight Also important to follow-up if ongoing troubles here has follow-up visit later this month ER if emergencies

## 2024-06-03 NOTE — Telephone Encounter (Signed)
 Spoke with patient and informed the patient per drs recommendations and additional torsemide  information and recomemndations, also spoke with the home health nurse Elida she states she will attempt to draw her labs and if she is not able she will send patient out to the lab. Verbal orders for labs were given.

## 2024-06-03 NOTE — Telephone Encounter (Signed)
 I spoke with the patient , she is currently receiving [facility] , Pt and Ot services. Rosina - [phone] - OT is currently at the patient's home, reporting weight gain today up 3 lbs from yesterday. Reporting bp 118/64 - parameters are to report systolic over 100 as well chronic pain in back and hip per reporting parameters. Pt reports having gone to the ER saturday they drew 2000 cc of fluid out of lungs, is on a 1500 cc fluid restrictions and would like to know what to do next, please advise.

## 2024-06-03 NOTE — Telephone Encounter (Signed)
 Copied from CRM [id number]. Topic: General - Other >> Jun 03, 2024 11:31 AM Tobias CROME wrote: Reason for CRM: [name] with [facility] calling to report patient's BP is 118/64. Also patient has had a weight increase of 3lbs.   Best callback number: [phone]

## 2024-06-03 NOTE — Telephone Encounter (Signed)
 Copied from CRM [id number]. Topic: Clinical - Home Health Verbal Orders >> Jun 03, 2024 11:23 AM Delon DASEN wrote: Caller/Agency: Elida with [facility] Callback Number: [phone] Service Requested: Skilled Nursing Frequency: n/a Any new concerns about the patient? Yes- yesterday weight 252,  today 255.8- was seen in ER over weekend on Saturday, was put on Torsemide  was increased to 20 mg twice a day and potassium 10 MEQ

## 2024-06-05 ENCOUNTER — Other Ambulatory Visit (HOSPITAL_COMMUNITY): Payer: Self-pay

## 2024-06-05 DIAGNOSIS — Z6838 Body mass index (BMI) 38.0-38.9, adult: Secondary | ICD-10-CM | POA: Diagnosis not present

## 2024-06-05 DIAGNOSIS — Z79899 Other long term (current) drug therapy: Secondary | ICD-10-CM | POA: Diagnosis not present

## 2024-06-05 DIAGNOSIS — S32000S Wedge compression fracture of unspecified lumbar vertebra, sequela: Secondary | ICD-10-CM | POA: Diagnosis not present

## 2024-06-05 DIAGNOSIS — M810 Age-related osteoporosis without current pathological fracture: Secondary | ICD-10-CM | POA: Diagnosis not present

## 2024-06-05 DIAGNOSIS — J984 Other disorders of lung: Secondary | ICD-10-CM | POA: Diagnosis not present

## 2024-06-05 MED ORDER — OXYCODONE HCL 15 MG PO TABS
15.0000 mg | ORAL_TABLET | ORAL | 0 refills | Status: DC
  Filled 2024-06-06: qty 180, 30d supply, fill #0

## 2024-06-06 ENCOUNTER — Other Ambulatory Visit (HOSPITAL_COMMUNITY): Payer: Self-pay

## 2024-06-09 ENCOUNTER — Telehealth: Payer: Self-pay | Admitting: *Deleted

## 2024-06-09 DIAGNOSIS — R600 Localized edema: Secondary | ICD-10-CM | POA: Diagnosis not present

## 2024-06-09 DIAGNOSIS — D509 Iron deficiency anemia, unspecified: Secondary | ICD-10-CM | POA: Diagnosis not present

## 2024-06-09 DIAGNOSIS — Z79899 Other long term (current) drug therapy: Secondary | ICD-10-CM | POA: Diagnosis not present

## 2024-06-09 NOTE — Telephone Encounter (Signed)
 Source  Kathleen [name] (Patient)   Subject  Kathleen [name] (Patient)   Topic  Clinical - Medical Advice    Communication  Reason for CRM: called to advise about patients was complaining 10 out of 10 pain on 10/13//

## 2024-06-09 NOTE — Telephone Encounter (Signed)
 Sorry that the patient is having so much pain She is under the management of the pain management She should alert me thank you

## 2024-06-10 ENCOUNTER — Encounter (INDEPENDENT_AMBULATORY_CARE_PROVIDER_SITE_OTHER): Payer: Self-pay | Admitting: Gastroenterology

## 2024-06-10 ENCOUNTER — Ambulatory Visit: Payer: Self-pay | Admitting: Family Medicine

## 2024-06-10 LAB — BASIC METABOLIC PANEL WITH GFR
BUN/Creatinine Ratio: 8 — ABNORMAL LOW (ref 12–28)
BUN: 8 mg/dL (ref 8–27)
CO2: 32 mmol/L — ABNORMAL HIGH (ref 20–29)
Calcium: 9.4 mg/dL (ref 8.7–10.3)
Chloride: 93 mmol/L — ABNORMAL LOW (ref 96–106)
Creatinine, Ser: 1.01 mg/dL — ABNORMAL HIGH (ref 0.57–1.00)
Glucose: 106 mg/dL — ABNORMAL HIGH (ref 70–99)
Potassium: 3.7 mmol/L (ref 3.5–5.2)
Sodium: 139 mmol/L (ref 134–144)
eGFR: 63 mL/min/1.73 (ref >59)

## 2024-06-10 LAB — CBC WITH DIFFERENTIAL/PLATELET
Basophils Absolute: 0.1 x10E3/uL (ref 0.0–0.2)
Basos: 1 %
EOS (ABSOLUTE): 0.4 x10E3/uL (ref 0.0–0.4)
Eos: 5 %
Hematocrit: 29 % — ABNORMAL LOW (ref 34.0–46.6)
Hemoglobin: 8.5 g/dL — ABNORMAL LOW (ref 11.1–15.9)
Immature Grans (Abs): 0.1 x10E3/uL (ref 0.0–0.1)
Immature Granulocytes: 2 %
Lymphocytes Absolute: 1.1 x10E3/uL (ref 0.7–3.1)
Lymphs: 12 %
MCH: 26.2 pg — ABNORMAL LOW (ref 26.6–33.0)
MCHC: 29.3 g/dL — ABNORMAL LOW (ref 31.5–35.7)
MCV: 89 fL (ref 79–97)
Monocytes Absolute: 0.8 x10E3/uL (ref 0.1–0.9)
Monocytes: 10 %
Neutrophils Absolute: 6.2 x10E3/uL (ref 1.4–7.0)
Neutrophils: 70 %
Platelets: 500 x10E3/uL — ABNORMAL HIGH (ref 150–450)
RBC: 3.25 x10E6/uL — ABNORMAL LOW (ref 3.77–5.28)
RDW: 16.9 % — ABNORMAL HIGH (ref 11.7–15.4)
WBC: 8.7 x10E3/uL (ref 3.4–10.8)

## 2024-06-10 LAB — IRON,TIBC AND FERRITIN PANEL
Ferritin: 44 ng/mL (ref 15–150)
Iron Saturation: 12 % — ABNORMAL LOW (ref 15–55)
Iron: 38 ug/dL (ref 27–139)
Total Iron Binding Capacity: 310 ug/dL (ref 250–450)
UIBC: 272 ug/dL (ref 118–369)

## 2024-06-16 ENCOUNTER — Other Ambulatory Visit: Payer: Self-pay | Admitting: Family Medicine

## 2024-06-16 ENCOUNTER — Other Ambulatory Visit: Payer: Self-pay | Admitting: "Endocrinology

## 2024-06-16 ENCOUNTER — Other Ambulatory Visit (HOSPITAL_COMMUNITY): Payer: Self-pay

## 2024-06-16 ENCOUNTER — Other Ambulatory Visit: Payer: Self-pay

## 2024-06-16 DIAGNOSIS — J849 Interstitial pulmonary disease, unspecified: Secondary | ICD-10-CM | POA: Diagnosis not present

## 2024-06-16 DIAGNOSIS — D860 Sarcoidosis of lung: Secondary | ICD-10-CM | POA: Diagnosis not present

## 2024-06-16 DIAGNOSIS — R0602 Shortness of breath: Secondary | ICD-10-CM | POA: Diagnosis not present

## 2024-06-17 ENCOUNTER — Other Ambulatory Visit: Payer: Self-pay

## 2024-06-17 ENCOUNTER — Other Ambulatory Visit (HOSPITAL_COMMUNITY): Payer: Self-pay

## 2024-06-17 ENCOUNTER — Other Ambulatory Visit: Payer: Self-pay | Admitting: Family Medicine

## 2024-06-17 MED ORDER — HYDROCORTISONE 10 MG PO TABS
ORAL_TABLET | ORAL | 3 refills | Status: AC
  Filled 2024-06-17: qty 120, 30d supply, fill #0
  Filled 2024-07-21: qty 120, 30d supply, fill #1
  Filled 2024-08-31: qty 120, 30d supply, fill #2

## 2024-06-18 ENCOUNTER — Telehealth: Payer: Self-pay | Admitting: Family Medicine

## 2024-06-18 ENCOUNTER — Other Ambulatory Visit (HOSPITAL_COMMUNITY): Payer: Self-pay

## 2024-06-18 NOTE — Telephone Encounter (Signed)
 Refill on  apixaban  (ELIQUIS ) 5 MG TABS tablet   [facility]

## 2024-06-19 ENCOUNTER — Other Ambulatory Visit (HOSPITAL_COMMUNITY): Payer: Self-pay

## 2024-06-19 MED ORDER — APIXABAN 5 MG PO TABS
5.0000 mg | ORAL_TABLET | Freq: Two times a day (BID) | ORAL | 0 refills | Status: DC
  Filled 2024-06-19: qty 60, 30d supply, fill #0

## 2024-06-20 ENCOUNTER — Other Ambulatory Visit (HOSPITAL_COMMUNITY): Payer: Self-pay

## 2024-06-22 DIAGNOSIS — S32040D Wedge compression fracture of fourth lumbar vertebra, subsequent encounter for fracture with routine healing: Secondary | ICD-10-CM | POA: Diagnosis not present

## 2024-06-22 NOTE — Telephone Encounter (Signed)
 She was given a 1 month prescription and she will have follow-up with us  coming up in a few days-we will discuss all of her medicines at that time

## 2024-06-24 ENCOUNTER — Encounter: Payer: Self-pay | Admitting: Family Medicine

## 2024-06-24 ENCOUNTER — Ambulatory Visit: Payer: Self-pay | Admitting: Family Medicine

## 2024-06-24 VITALS — Ht 70.0 in

## 2024-06-24 DIAGNOSIS — Z1211 Encounter for screening for malignant neoplasm of colon: Secondary | ICD-10-CM

## 2024-06-24 DIAGNOSIS — J849 Interstitial pulmonary disease, unspecified: Secondary | ICD-10-CM

## 2024-06-24 DIAGNOSIS — R600 Localized edema: Secondary | ICD-10-CM

## 2024-06-24 DIAGNOSIS — D509 Iron deficiency anemia, unspecified: Secondary | ICD-10-CM | POA: Diagnosis not present

## 2024-06-24 DIAGNOSIS — G894 Chronic pain syndrome: Secondary | ICD-10-CM

## 2024-06-24 DIAGNOSIS — I480 Paroxysmal atrial fibrillation: Secondary | ICD-10-CM

## 2024-06-24 NOTE — Progress Notes (Signed)
 Subjective:    Patient ID: Kathleen [name], female    DOB: 01-24-1961, 63 y.o.   MRN: [id number]  HPI Discussed the use of AI scribe software for clinical note transcription with the patient, who gave verbal consent to proceed.  History of Present Illness   Kathleen [name] is a 63 year old female with congestive heart failure and osteoporosis who presents with concerns about her breathing and bone health.  She experiences difficulty breathing, particularly when lying flat, which she attributes to her congestive heart failure. Shortness of breath is a persistent issue. She has a history of fluid retention. She is managing her diet to prevent fluid overload, which previously caused significant swelling in her feet and legs.  She has a history of osteoporosis and is currently taking Fosamax  once a week. She has not yet received Prolia injections as her calcium  levels need to be at a certain level before starting this treatment. She mentions a new back fracture and is concerned about her bone health. She experiences significant back pain despite taking oxycodone , which she takes about 60 mg per day. The pain is persistent and severe, impacting her ability to move comfortably.  Recent blood work indicated low iron  saturation, and her hemoglobin levels have fluctuated, dropping to 7.7 in early October but recently rising to 8.5. No blood in bowel movements has been noticed. She is currently taking Eliquis  as a blood thinner.  She has been working on improving her mobility with physical therapy, using a walker instead of a wheelchair for short distances around her home. Standing is more challenging than walking due to both back pain and leg weakness.  She has a history of atrial fibrillation and is concerned about her heart health.        Review of Systems     Objective:   Physical Exam General-in no acute distress Eyes-no discharge Lungs-respiratory rate normal, CTA CV-no  murmurs,RRR Extremities skin warm dry no edema Neuro grossly normal Behavior normal, alert        Assessment & Plan:   Iron  deficiency anemia Iron  deficiency anemia with low iron  saturation and hemoglobin levels. Hemoglobin improved to 8.5. Low iron  saturation suggests poor absorption or potential blood loss. - Refer to hematology for iron  infusions. - Provide stool test kit for occult blood.  Osteoporosis with current pathological vertebral fractures Osteoporosis with vertebral fractures. On Fosamax . Unable to start Prolia due to low calcium . Off prednisone , continues hydrocortisone  for adrenal insufficiency. - Continue Fosamax  weekly. - Monitor calcium  levels for Prolia eligibility. - Encourage physical therapy.  Chronic pain syndrome Chronic pain syndrome with significant back pain from vertebral fractures. Managed with oxycodone  for pain relief. - Continue oxycodone . - Encourage physical therapy.  Atrial fibrillation Atrial fibrillation managed with Eliquis . Educated on AFib symptoms and emergency care. - Continue Eliquis . - Educate on AFib signs and emergency care.  Chronic adrenal insufficiency Chronic adrenal insufficiency managed with hydrocortisone . Off prednisone , hydrocortisone  less harmful to bones. - Continue hydrocortisone  therapy.  Edema Edema improved post-hospitalization. Weight reduced to 242 pounds from 272 pounds. Advised on dietary salt reduction. - Monitor for fluid overload. - Advise on dietary salt reduction.  1. Iron  deficiency anemia, unspecified iron  deficiency anemia type (Primary) Referral to hematology oncology for iron  infusion would be most likely what she will need given her saturation is low Her ferritin is elevated but this is acute phase reactant Given her low hemoglobin and low iron  saturation I think iron  infusion would be reasonable  2. Encounter for screening fecal occult blood testing Stool testing for blood - IFOBT POC (occult  bld, rslt in office); Future - Ambulatory referral to Hematology / Oncology  3. Paroxysmal atrial fibrillation (HCC) Continue Eliquis   4. Pedal edema Continue current measures  5. Chronic pain syndrome Continue current measures from pain medicine.  Follow-up by February for recheck  6. Interstitial lung disease (HCC) Continue current measures from pulmonary at [facility]  Also recommended for patient to start taking 1000 mcg B12 daily

## 2024-06-25 ENCOUNTER — Other Ambulatory Visit (HOSPITAL_COMMUNITY): Payer: Self-pay

## 2024-06-29 ENCOUNTER — Other Ambulatory Visit: Payer: Self-pay | Admitting: Family Medicine

## 2024-06-29 ENCOUNTER — Encounter: Payer: Self-pay | Admitting: Radiology

## 2024-06-29 ENCOUNTER — Other Ambulatory Visit (HOSPITAL_COMMUNITY): Payer: Self-pay

## 2024-06-29 MED ORDER — FERROUS SULFATE 325 (65 FE) MG PO TABS
325.0000 mg | ORAL_TABLET | ORAL | 0 refills | Status: DC
  Filled 2024-06-29: qty 4, 28d supply, fill #0

## 2024-07-02 ENCOUNTER — Telehealth: Payer: Self-pay | Admitting: "Endocrinology

## 2024-07-02 ENCOUNTER — Other Ambulatory Visit: Payer: Self-pay | Admitting: "Endocrinology

## 2024-07-02 ENCOUNTER — Other Ambulatory Visit (HOSPITAL_COMMUNITY): Payer: Self-pay

## 2024-07-02 MED ORDER — WEGOVY 0.25 MG/0.5ML ~~LOC~~ SOAJ: 0.2500 mg | SUBCUTANEOUS | 0 refills | Status: DC

## 2024-07-02 NOTE — Telephone Encounter (Signed)
 Pt is stating that she lost her paper RX for Wegovy , and needs a new copy

## 2024-07-03 ENCOUNTER — Telehealth (HOSPITAL_COMMUNITY): Payer: Self-pay | Admitting: Pharmacy Technician

## 2024-07-03 ENCOUNTER — Other Ambulatory Visit (HOSPITAL_BASED_OUTPATIENT_CLINIC_OR_DEPARTMENT_OTHER): Payer: Self-pay

## 2024-07-03 ENCOUNTER — Other Ambulatory Visit: Payer: Self-pay | Admitting: "Endocrinology

## 2024-07-03 ENCOUNTER — Telehealth: Payer: Self-pay

## 2024-07-03 ENCOUNTER — Other Ambulatory Visit: Payer: Self-pay | Admitting: Family Medicine

## 2024-07-03 ENCOUNTER — Other Ambulatory Visit (HOSPITAL_COMMUNITY): Payer: Self-pay

## 2024-07-03 MED ORDER — HYDROXYZINE HCL 10 MG PO TABS
10.0000 mg | ORAL_TABLET | Freq: Three times a day (TID) | ORAL | 1 refills | Status: DC | PRN
  Filled 2024-07-03: qty 45, 15d supply, fill #0
  Filled 2024-07-21: qty 45, 15d supply, fill #1

## 2024-07-03 MED ORDER — WEGOVY 0.25 MG/0.5ML ~~LOC~~ SOAJ
0.2500 mg | SUBCUTANEOUS | 0 refills | Status: DC
  Filled 2024-07-03: qty 6, 84d supply, fill #0

## 2024-07-03 NOTE — Telephone Encounter (Signed)
 Now patient wants it sent to [facility] because she said she is in a wheelchair

## 2024-07-07 ENCOUNTER — Other Ambulatory Visit (HOSPITAL_COMMUNITY): Payer: Self-pay

## 2024-07-07 ENCOUNTER — Telehealth: Payer: Self-pay | Admitting: "Endocrinology

## 2024-07-07 DIAGNOSIS — Z6837 Body mass index (BMI) 37.0-37.9, adult: Secondary | ICD-10-CM | POA: Diagnosis not present

## 2024-07-07 DIAGNOSIS — M810 Age-related osteoporosis without current pathological fracture: Secondary | ICD-10-CM | POA: Diagnosis not present

## 2024-07-07 DIAGNOSIS — Z79899 Other long term (current) drug therapy: Secondary | ICD-10-CM | POA: Diagnosis not present

## 2024-07-07 DIAGNOSIS — J984 Other disorders of lung: Secondary | ICD-10-CM | POA: Diagnosis not present

## 2024-07-07 DIAGNOSIS — M545 Low back pain, unspecified: Secondary | ICD-10-CM | POA: Diagnosis not present

## 2024-07-07 MED ORDER — OXYCODONE HCL 15 MG PO TABS
15.0000 mg | ORAL_TABLET | ORAL | 0 refills | Status: DC | PRN
  Filled 2024-07-07: qty 180, 30d supply, fill #0

## 2024-07-07 MED ORDER — BUPRENORPHINE 7.5 MCG/HR TD PTWK
1.0000 | MEDICATED_PATCH | TRANSDERMAL | 0 refills | Status: DC
  Filled 2024-07-07: qty 4, 28d supply, fill #0

## 2024-07-07 NOTE — Telephone Encounter (Signed)
 Pharmacy Patient Advocate Encounter   Received notification from Pt Calls Messages that prior authorization for Wegovy  0.25mg /0.37ml is required/requested.   Insurance verification completed.   The patient is insured through [facility] ADVANTAGE/RX ADVANCE.   Per test claim: PA required; PA submitted to above mentioned insurance via Latent Key/confirmation #/EOC [id number] Status is pending

## 2024-07-07 NOTE — Telephone Encounter (Signed)
 Joy you will need to call and explain this to her.

## 2024-07-07 NOTE — Telephone Encounter (Signed)
 Pt states she needs ozempic instead of wegovy  due to her congestive heart failure

## 2024-07-07 NOTE — Telephone Encounter (Signed)
 Noted

## 2024-07-09 ENCOUNTER — Telehealth (HOSPITAL_COMMUNITY): Payer: Self-pay

## 2024-07-09 ENCOUNTER — Other Ambulatory Visit (HOSPITAL_COMMUNITY): Payer: Self-pay

## 2024-07-09 ENCOUNTER — Ambulatory Visit: Admitting: Family Medicine

## 2024-07-09 ENCOUNTER — Encounter: Payer: Self-pay | Admitting: Family Medicine

## 2024-07-09 ENCOUNTER — Other Ambulatory Visit: Payer: Self-pay | Admitting: "Endocrinology

## 2024-07-09 VITALS — BP 117/70 | HR 93 | Temp 97.9°F | Ht 70.0 in | Wt 248.0 lb

## 2024-07-09 DIAGNOSIS — Z8679 Personal history of other diseases of the circulatory system: Secondary | ICD-10-CM | POA: Diagnosis not present

## 2024-07-09 DIAGNOSIS — D509 Iron deficiency anemia, unspecified: Secondary | ICD-10-CM | POA: Diagnosis not present

## 2024-07-09 DIAGNOSIS — L729 Follicular cyst of the skin and subcutaneous tissue, unspecified: Secondary | ICD-10-CM | POA: Diagnosis not present

## 2024-07-09 DIAGNOSIS — J849 Interstitial pulmonary disease, unspecified: Secondary | ICD-10-CM | POA: Diagnosis not present

## 2024-07-09 DIAGNOSIS — N312 Flaccid neuropathic bladder, not elsewhere classified: Secondary | ICD-10-CM | POA: Diagnosis not present

## 2024-07-09 MED ORDER — OZEMPIC (0.25 OR 0.5 MG/DOSE) 2 MG/3ML ~~LOC~~ SOPN
0.2500 mg | PEN_INJECTOR | SUBCUTANEOUS | 0 refills | Status: DC
  Filled 2024-07-09: qty 9, 84d supply, fill #0

## 2024-07-09 NOTE — Telephone Encounter (Signed)
 Spoke with pt, she stated she has been told by her insurance representative that her insurance will cover Ozempic due to her heart failure. States Wegovy  is not on formulary.

## 2024-07-09 NOTE — Telephone Encounter (Signed)
 Left a message requesting pt return call to the office.

## 2024-07-09 NOTE — Progress Notes (Signed)
   Subjective:    Patient ID: Kathleen [name], female    DOB: 10-08-1960, 63 y.o.   MRN: [id number] Knot on top of scalp and back of scalp for several weeks. HPI Patient was recently in the hospital with significant edema and fluid retention Has severe obesity along with a history of heart failure with preserved ejection fraction Below is the documentation by the hospitalist Patient also has prediabetes trying to do the best can keeping her sugars under control Suffers with morbid obesity Suffers with osteoporosis and osteoarthritis which is worsened by her weight  Current Assessment & Plan NoteWritten:Emokpae, Ejiroghene E, MD9/15/2025 8:10 PM Presenting with dyspnea, 2+ pitting bilateral lower extremity edema, weight gain per charts 22 pounds since recent hospitalization. X-ray shows pulmonary vascular congestion. BNP-53. Likely not reflective of volume status due to habitus. No increased O2 requirements. Last echo 04/24/2024 EF of 60 to 65%, grade 1 DD. Reports compliance with Lasix  20 mg daily. Recent hospitalization for similar. -IV Lasix  80 mg Given, Continue 40 q12h - Strict input output Daily weights   Review of Systems     Objective:   Physical Exam  General-in no acute distress Eyes-no discharge Lungs-respiratory rate normal, CTA CV-no murmurs,RRR Extremities skin warm dry no edema Neuro grossly normal Behavior normal, alert Has cyst on her scalp      Assessment & Plan:  1. Scalp cyst (Primary) To general surgery for their evaluation I find no evidence of infection more than likely these are sebaceous cyst it is possible that she may need to have minor surgery to take care of it or she can choose to watch it but if it becomes infected will need treatment  2. Iron  deficiency anemia, unspecified iron  deficiency anemia type She will be seeing hematology coming up for further evaluation We ordered stool testing for blood but unfortunately her specimen was not processed we  will be moving back to our regular office by early December and we will reinitiate testing  3. Atonic bladder Intermittent In-N-Out caths recommended she has previously seen urology  4. Morbid (severe) obesity due to excess calories (HCC) Portion control regular physical activity very difficult for her to lose weight with her underlying diseases same medications  5. Interstitial lung disease (HCC) Followed by specialist at [facility]  6. History of CHF (congestive heart failure) Previous ejection fraction was good but patient did have acute CHF on chronic CHF Based on this she would benefit from GLP-1 to help her lose weight which would also help keep her CHF under good control Patient with prediabetes

## 2024-07-10 ENCOUNTER — Other Ambulatory Visit (HOSPITAL_COMMUNITY): Payer: Self-pay

## 2024-07-10 NOTE — Telephone Encounter (Signed)
 Made pt aware

## 2024-07-12 ENCOUNTER — Other Ambulatory Visit (HOSPITAL_COMMUNITY): Payer: Self-pay

## 2024-07-14 ENCOUNTER — Other Ambulatory Visit (HOSPITAL_COMMUNITY): Payer: Self-pay

## 2024-07-14 ENCOUNTER — Other Ambulatory Visit: Payer: Self-pay

## 2024-07-14 DIAGNOSIS — L729 Follicular cyst of the skin and subcutaneous tissue, unspecified: Secondary | ICD-10-CM

## 2024-07-14 NOTE — Progress Notes (Signed)
 Referral placed.

## 2024-07-15 NOTE — Telephone Encounter (Signed)
 Pharmacy Patient Advocate Encounter  Received notification from [facility] ADVANTAGE/RX ADVANCE that Prior Authorization for [facility] has been DENIED.  Full denial letter will be uploaded to the media tab. See denial reason below.

## 2024-07-16 NOTE — Telephone Encounter (Signed)
 Pt stated her insurance would cover Ozempic due to her heart failure. [name] made aware. Rx for Ozempic 0.25mg  weekly sent to pharmacy by Dr.Nida.

## 2024-07-17 DIAGNOSIS — D860 Sarcoidosis of lung: Secondary | ICD-10-CM | POA: Diagnosis not present

## 2024-07-17 DIAGNOSIS — J849 Interstitial pulmonary disease, unspecified: Secondary | ICD-10-CM | POA: Diagnosis not present

## 2024-07-17 DIAGNOSIS — R0602 Shortness of breath: Secondary | ICD-10-CM | POA: Diagnosis not present

## 2024-07-18 DIAGNOSIS — D860 Sarcoidosis of lung: Secondary | ICD-10-CM | POA: Diagnosis not present

## 2024-07-18 DIAGNOSIS — R0602 Shortness of breath: Secondary | ICD-10-CM | POA: Diagnosis not present

## 2024-07-21 ENCOUNTER — Other Ambulatory Visit (HOSPITAL_COMMUNITY): Payer: Self-pay

## 2024-07-21 ENCOUNTER — Other Ambulatory Visit: Payer: Self-pay | Admitting: Family Medicine

## 2024-07-21 MED ORDER — APIXABAN 5 MG PO TABS
5.0000 mg | ORAL_TABLET | Freq: Two times a day (BID) | ORAL | 0 refills | Status: DC
  Filled 2024-07-21: qty 60, 30d supply, fill #0

## 2024-07-22 ENCOUNTER — Other Ambulatory Visit (HOSPITAL_COMMUNITY): Payer: Self-pay

## 2024-07-22 ENCOUNTER — Other Ambulatory Visit: Payer: Self-pay

## 2024-07-22 DIAGNOSIS — M542 Cervicalgia: Secondary | ICD-10-CM | POA: Diagnosis not present

## 2024-07-22 MED ORDER — CALCITONIN (SALMON) 200 UNIT/ACT NA SOLN
1.0000 | Freq: Every day | NASAL | 0 refills | Status: AC
  Filled 2024-07-22: qty 3.7, 41d supply, fill #0

## 2024-07-27 ENCOUNTER — Other Ambulatory Visit (HOSPITAL_COMMUNITY): Payer: Self-pay

## 2024-07-28 ENCOUNTER — Other Ambulatory Visit (HOSPITAL_COMMUNITY): Payer: Self-pay

## 2024-07-28 NOTE — Progress Notes (Signed)
 Pharmacy Quality Measure Review  This patient is appearing on a report for being at risk of failing the adherence measure for cholesterol (statin) medications this calendar year.   Medication: rosuvastatin  20 mg Last fill date: 07/07/24 for 30 day supply  Insurance report was not up to date. No action needed at this time. Refills remaining on prescription.  Woodie Jock, PharmD PGY1 Pharmacy Resident  07/28/2024

## 2024-07-29 ENCOUNTER — Other Ambulatory Visit: Payer: Self-pay

## 2024-07-29 ENCOUNTER — Other Ambulatory Visit (HOSPITAL_COMMUNITY): Payer: Self-pay

## 2024-07-29 ENCOUNTER — Other Ambulatory Visit: Payer: Self-pay | Admitting: Nurse Practitioner

## 2024-07-29 MED ORDER — HYDROXYZINE HCL 10 MG PO TABS
10.0000 mg | ORAL_TABLET | Freq: Three times a day (TID) | ORAL | 1 refills | Status: DC | PRN
  Filled 2024-07-29 – 2024-07-30 (×3): qty 45, 15d supply, fill #0

## 2024-07-30 ENCOUNTER — Other Ambulatory Visit: Payer: Self-pay | Admitting: Family Medicine

## 2024-07-30 ENCOUNTER — Emergency Department (HOSPITAL_COMMUNITY)

## 2024-07-30 ENCOUNTER — Encounter (HOSPITAL_COMMUNITY): Payer: Self-pay | Admitting: *Deleted

## 2024-07-30 ENCOUNTER — Other Ambulatory Visit: Payer: Self-pay

## 2024-07-30 ENCOUNTER — Other Ambulatory Visit (HOSPITAL_COMMUNITY): Payer: Self-pay

## 2024-07-30 ENCOUNTER — Emergency Department (HOSPITAL_COMMUNITY)
Admission: EM | Admit: 2024-07-30 | Discharge: 2024-07-30 | Disposition: A | Discharge status: Home / Self Care | Attending: Emergency Medicine | Admitting: Emergency Medicine

## 2024-07-30 DIAGNOSIS — Z794 Long term (current) use of insulin: Secondary | ICD-10-CM | POA: Diagnosis not present

## 2024-07-30 DIAGNOSIS — M545 Low back pain, unspecified: Secondary | ICD-10-CM | POA: Diagnosis present

## 2024-07-30 DIAGNOSIS — Z7901 Long term (current) use of anticoagulants: Secondary | ICD-10-CM | POA: Insufficient documentation

## 2024-07-30 DIAGNOSIS — R079 Chest pain, unspecified: Secondary | ICD-10-CM | POA: Diagnosis not present

## 2024-07-30 DIAGNOSIS — R918 Other nonspecific abnormal finding of lung field: Secondary | ICD-10-CM | POA: Diagnosis not present

## 2024-07-30 DIAGNOSIS — M62830 Muscle spasm of back: Secondary | ICD-10-CM | POA: Insufficient documentation

## 2024-07-30 DIAGNOSIS — R0989 Other specified symptoms and signs involving the circulatory and respiratory systems: Secondary | ICD-10-CM | POA: Diagnosis not present

## 2024-07-30 DIAGNOSIS — J9811 Atelectasis: Secondary | ICD-10-CM | POA: Diagnosis not present

## 2024-07-30 DIAGNOSIS — M899 Disorder of bone, unspecified: Secondary | ICD-10-CM | POA: Diagnosis not present

## 2024-07-30 LAB — COMPREHENSIVE METABOLIC PANEL WITH GFR
ALT: 15 U/L (ref 0–44)
AST: 26 U/L (ref 15–41)
Albumin: 3.6 g/dL (ref 3.5–5.0)
Alkaline Phosphatase: 83 U/L (ref 38–126)
Anion gap: 7 (ref 5–15)
BUN: 11 mg/dL (ref 8–23)
CO2: 32 mmol/L (ref 22–32)
Calcium: 9.2 mg/dL (ref 8.9–10.3)
Chloride: 103 mmol/L (ref 98–111)
Creatinine, Ser: 0.95 mg/dL (ref 0.44–1.00)
GFR, Estimated: >60 mL/min (ref >60)
Glucose, Bld: 95 mg/dL (ref 70–99)
Potassium: 3.7 mmol/L (ref 3.5–5.1)
Sodium: 142 mmol/L (ref 135–145)
Total Bilirubin: 0.2 mg/dL (ref 0.0–1.2)
Total Protein: 6.3 g/dL — ABNORMAL LOW (ref 6.5–8.1)

## 2024-07-30 LAB — CBC WITH DIFFERENTIAL/PLATELET
Abs Immature Granulocytes: 0.12 K/uL — ABNORMAL HIGH (ref 0.00–0.07)
Basophils Absolute: 0.1 K/uL (ref 0.0–0.1)
Basophils Relative: 1 %
Eosinophils Absolute: 0.6 K/uL — ABNORMAL HIGH (ref 0.0–0.5)
Eosinophils Relative: 6 %
HCT: 30.6 % — ABNORMAL LOW (ref 36.0–46.0)
Hemoglobin: 9.1 g/dL — ABNORMAL LOW (ref 12.0–15.0)
Immature Granulocytes: 1 %
Lymphocytes Relative: 12 %
Lymphs Abs: 1.2 K/uL (ref 0.7–4.0)
MCH: 26.7 pg (ref 26.0–34.0)
MCHC: 29.7 g/dL — ABNORMAL LOW (ref 30.0–36.0)
MCV: 89.7 fL (ref 80.0–100.0)
Monocytes Absolute: 1 K/uL (ref 0.1–1.0)
Monocytes Relative: 9 %
Neutro Abs: 7.3 K/uL (ref 1.7–7.7)
Neutrophils Relative %: 71 %
Platelets: 447 K/uL — ABNORMAL HIGH (ref 150–400)
RBC: 3.41 MIL/uL — ABNORMAL LOW (ref 3.87–5.11)
RDW: 16.8 % — ABNORMAL HIGH (ref 11.5–15.5)
WBC: 10.3 K/uL (ref 4.0–10.5)
nRBC: 0 % (ref 0.0–0.2)

## 2024-07-30 LAB — TROPONIN T, HIGH SENSITIVITY: Troponin T High Sensitivity: <15 ng/L (ref 0–19)

## 2024-07-30 MED ORDER — LORAZEPAM 2 MG/ML IJ SOLN
1.0000 mg | Freq: Once | INTRAMUSCULAR | Status: AC
  Administered 2024-07-30: 1 mg via INTRAVENOUS
  Filled 2024-07-30: qty 1

## 2024-07-30 MED ORDER — GABAPENTIN 300 MG PO CAPS: 300.0000 mg | ORAL_CAPSULE | Freq: Four times a day (QID) | ORAL | 0 refills | Status: AC

## 2024-07-30 MED ORDER — HYDROMORPHONE HCL 1 MG/ML IJ SOLN
1.0000 mg | Freq: Once | INTRAMUSCULAR | Status: AC
  Administered 2024-07-30: 1 mg via INTRAVENOUS
  Filled 2024-07-30: qty 1

## 2024-07-30 MED ORDER — HYDROMORPHONE HCL 1 MG/ML IJ SOLN
2.0000 mg | Freq: Once | INTRAMUSCULAR | Status: AC
  Administered 2024-07-30: 2 mg via INTRAVENOUS
  Filled 2024-07-30: qty 2

## 2024-07-30 NOTE — ED Notes (Signed)
 Patient transported to X-ray

## 2024-07-30 NOTE — ED Triage Notes (Signed)
 Pt BIB [facility] from home for c/o chronic back pain; pt took one of her oxycodone  last around 1230 today  BP 111/68 HR 65 100% on 4L via [place]  Pt states she started to have chest pain and back pain last night  Pt has chronic back pain

## 2024-07-30 NOTE — Discharge Instructions (Signed)
 Follow-up with your doctor next week.  Take your regular pain medicines as prescribed and you can add this other medicine to help with the pain.

## 2024-07-31 ENCOUNTER — Inpatient Hospital Stay: Attending: Oncology | Admitting: Oncology

## 2024-07-31 ENCOUNTER — Inpatient Hospital Stay

## 2024-07-31 ENCOUNTER — Encounter: Payer: Self-pay | Admitting: Oncology

## 2024-07-31 DIAGNOSIS — Z803 Family history of malignant neoplasm of breast: Secondary | ICD-10-CM | POA: Insufficient documentation

## 2024-07-31 DIAGNOSIS — D508 Other iron deficiency anemias: Secondary | ICD-10-CM

## 2024-07-31 DIAGNOSIS — G894 Chronic pain syndrome: Secondary | ICD-10-CM | POA: Insufficient documentation

## 2024-07-31 DIAGNOSIS — D509 Iron deficiency anemia, unspecified: Secondary | ICD-10-CM | POA: Insufficient documentation

## 2024-07-31 DIAGNOSIS — Z8042 Family history of malignant neoplasm of prostate: Secondary | ICD-10-CM | POA: Insufficient documentation

## 2024-07-31 DIAGNOSIS — S32030A Wedge compression fracture of third lumbar vertebra, initial encounter for closed fracture: Secondary | ICD-10-CM

## 2024-07-31 DIAGNOSIS — M4850XA Collapsed vertebra, not elsewhere classified, site unspecified, initial encounter for fracture: Secondary | ICD-10-CM | POA: Insufficient documentation

## 2024-07-31 DIAGNOSIS — J9611 Chronic respiratory failure with hypoxia: Secondary | ICD-10-CM | POA: Insufficient documentation

## 2024-07-31 NOTE — ED Provider Notes (Signed)
 [place] [facility] AT [facility] Provider Note   CSN: [id number] Arrival date & time: 07/30/24  1443     Patient presents with: Back Pain   Kathleen [name] is a 63 y.o. female.   Patient has a history of chronic back pain.  She has had numerous compression fractures.  Patient complains of upper and lower back pain and neck pain.  The history is provided by the patient and medical records. No language interpreter was used.  Back Pain Location:  Thoracic spine and lumbar spine Quality:  Aching Pain severity:  Severe Pain is:  Worse during the day Timing:  Constant Progression:  Worsening Chronicity:  Recurrent Context: emotional stress   Relieved by:  Nothing Worsened by:  Nothing Ineffective treatments:  None tried Associated symptoms: no abdominal pain, no chest pain and no headaches        Prior to Admission medications   Medication Sig Start Date End Date Taking? Authorizing Provider  gabapentin  (NEURONTIN ) 300 MG capsule Take 1 capsule (300 mg total) by mouth 4 (four) times daily. 07/30/24  Yes Opal Mckellips, MD  acetaminophen  (TYLENOL ) 325 MG tablet Take 2 tablets (650 mg total) by mouth every 6 (six) hours as needed for mild pain (pain score 1-3) or fever (or Fever >/= 101). 02/19/24   Angiulli, Toribio PARAS, PA-C  alendronate  (FOSAMAX ) 70 MG tablet Take 1 tablet (70 mg total) by mouth every Saturday. Take with a full glass of water  on an empty stomach. 03/14/24   Alphonsa Glendia LABOR, MD  apixaban  (ELIQUIS ) 5 MG TABS tablet Take 1 tablet (5 mg total) by mouth 2 (two) times daily. 07/21/24   Alphonsa Glendia LABOR, MD  buprenorphine  (BUTRANS ) 7.5 MCG/HR Apply 1 patch onto skin weekly, change every 7 days 07/07/24     busPIRone  (BUSPAR ) 15 MG tablet Take 1 tablet (15 mg) by mouth 3 times daily. 03/09/24   Alphonsa Glendia LABOR, MD  calcitonin, salmon, (MIACALCIN MARCIANA) 200 UNIT/ACT nasal spray Place 1 spray into alternate nostrils daily. 07/22/24     Calcium  Citrate  333 MG TABS Take one tablet by mouth 3 (three) times daily with meals. 03/17/24   Nida, Gebreselassie W, MD  Capsaicin -Menthol -Methyl Sal (CAPSAICIN -METHYL SAL-MENTHOL ) 0.025-1-12 % CREA Apply 1 Application topically 3 (three) times daily as needed. 05/23/24   Patsy Lenis, MD  ferrous sulfate  (FEROSUL) 325 (65 FE) MG tablet Take 1 tablet (325 mg total) by mouth once a week. 06/29/24   Alphonsa Glendia LABOR, MD  folic acid  (FOLVITE ) 1 MG tablet Take 1 tablet (1 mg total) by mouth daily. 05/04/24   Alphonsa Glendia LABOR, MD  hydrocortisone  (CORTEF ) 10 MG tablet Take 2 tablets by mouth at 8:00 am and 1 tablet at lunch. (May double dose on sick days x 3-5 days) 06/17/24   Nida, Gebreselassie W, MD  hydrOXYzine  (ATARAX ) 10 MG tablet Take 1 tablet (10 mg total) by mouth 3 (three) times daily as needed for anxiety 07/29/24   Hoskins, Carolyn C, NP  levothyroxine  (SYNTHROID ) 50 MCG tablet Take 1 tablet by mouth daily before breakfast. 03/09/24 09/05/24  Alphonsa Glendia LABOR, MD  linaclotide  (LINZESS ) 145 MCG CAPS capsule Take 1 capsule (145 mcg total) by mouth daily before breakfast. 03/09/24   Alphonsa Glendia LABOR, MD  midodrine  (PROAMATINE ) 5 MG tablet Take 1 tablet (5 mg total) by mouth 2 (two) times daily with a meal. 03/09/24   Luking, Glendia LABOR, MD  ondansetron  (ZOFRAN -ODT) 8 MG disintegrating tablet Dissolve 1 tablet (  8 mg total) by mouth every 8 (eight) hours as needed. Patient taking differently: Take 8 mg by mouth every 8 (eight) hours as needed for nausea or vomiting. 05/04/24 05/04/25  Alphonsa Glendia LABOR, MD  oxyCODONE  (ROXICODONE ) 15 MG immediate release tablet Take 1 tablet (15 mg total) by mouth every 4 (four) hours as needed for pain 05/07/24     oxyCODONE  (ROXICODONE ) 15 MG immediate release tablet Take 1 tablet (15 mg total) by mouth every 4 (four) hours. 06/06/24 06/05/24     oxyCODONE  (ROXICODONE ) 15 MG immediate release tablet Take 1 tablet (15 mg total) by mouth every 4 (four) hours as needed for pain. 07/07/24     OXYGEN   Inhale 5 L into the lungs continuous.    [provider]  pantoprazole  (PROTONIX ) 40 MG tablet Take 1 tablet (40 mg total) by mouth 2 (two) times daily. 03/09/24   Alphonsa Glendia LABOR, MD  polyethylene glycol (MIRALAX  / GLYCOLAX ) 17 g packet Take 17 g by mouth 2 (two) times daily. 01/23/24   Pearlean Manus, MD  potassium chloride  (KLOR-CON  M) 10 MEQ tablet Take 1 tablet (10 mEq total) by mouth 2 (two) times daily. 05/29/24   Alphonsa Glendia LABOR, MD  revefenacin  (YUPELRI ) 175 MCG/3ML nebulizer solution Take 3 mLs (175 mcg total) by nebulization daily. 02/24/24   Angiulli, Toribio PARAS, PA-C  rosuvastatin  (CRESTOR ) 20 MG tablet Take 1 tablet (20 mg total) by mouth daily. 03/09/24   Alphonsa Glendia LABOR, MD  Semaglutide ,0.25 or 0.5MG /DOS, (OZEMPIC , 0.25 OR 0.5 MG/DOSE,) 2 MG/3ML SOPN Inject 0.25 mg into the skin once a week. 07/09/24   Nida, Gebreselassie W, MD  senna-docusate (SENOKOT-S) 8.6-50 MG tablet Take 2 tablets by mouth at bedtime. 01/23/24   Pearlean Manus, MD  topiramate  (TOPAMAX ) 50 MG tablet Take 1 tablet (50 mg total) by mouth daily with lunch. 03/17/24   Lenis Ethelle ORN, MD  torsemide  (DEMADEX ) 20 MG tablet Take 2 tablets by mouth every morning, may take 1 additional torsemide  at 1 PM when weight gain is more than 2 pounds in 1 day 06/03/24   Luking, Glendia LABOR, MD  Vitamin D , Ergocalciferol , (DRISDOL ) 1.25 MG (50000 UNIT) CAPS capsule Take 1 capsule (50,000 Units total) by mouth every Sunday. 03/01/24   Angiulli, Toribio PARAS, PA-C    Allergies: Bee venom, Contrast media [iodinated contrast media], Bactrim  [sulfamethoxazole -trimethoprim ], and Codeine    Review of Systems  Constitutional:  Negative for appetite change and fatigue.  HENT:  Negative for congestion, ear discharge and sinus pressure.   Eyes:  Negative for discharge.  Respiratory:  Negative for cough.   Cardiovascular:  Negative for chest pain.  Gastrointestinal:  Negative for abdominal pain and diarrhea.  Genitourinary:  Negative for  frequency and hematuria.  Musculoskeletal:  Positive for back pain.       Neck pain  Skin:  Negative for rash.  Neurological:  Negative for seizures and headaches.  Psychiatric/Behavioral:  Negative for hallucinations.     Updated Vital Signs BP (!) 123/44   Pulse 94   Temp 98.4 F (36.9 C) (Oral)   Resp 16   Ht 5' 10 (1.778 m)   Wt 108.9 kg   LMP 05/17/2016   SpO2 99%   BMI 34.44 kg/m   Physical Exam Vitals and nursing note reviewed.  Constitutional:      Appearance: She is well-developed.  HENT:     Head: Normocephalic.     Nose: Nose normal.  Eyes:     General:  No scleral icterus.    Conjunctiva/sclera: Conjunctivae normal.  Neck:     Thyroid : No thyromegaly.  Cardiovascular:     Rate and Rhythm: Normal rate and regular rhythm.     Heart sounds: No murmur heard.    No friction rub. No gallop.  Pulmonary:     Breath sounds: No stridor. No wheezing or rales.  Chest:     Chest wall: No tenderness.  Abdominal:     General: There is no distension.     Tenderness: There is no abdominal tenderness. There is no rebound.  Musculoskeletal:        General: Normal range of motion.     Cervical back: Neck supple.     Comments: Tenderness along her entire spine  Lymphadenopathy:     Cervical: No cervical adenopathy.  Skin:    Findings: No erythema or rash.  Neurological:     Mental Status: She is alert and oriented to person, place, and time.     Motor: No abnormal muscle tone.     Coordination: Coordination normal.  Psychiatric:        Behavior: Behavior normal.     (all labs ordered are listed, but only abnormal results are displayed) Labs Reviewed  CBC WITH DIFFERENTIAL/PLATELET - Abnormal; Notable for the following components:      Result Value   RBC 3.41 (*)    Hemoglobin 9.1 (*)    HCT 30.6 (*)    MCHC 29.7 (*)    RDW 16.8 (*)    Platelets 447 (*)    Eosinophils Absolute 0.6 (*)    Abs Immature Granulocytes 0.12 (*)    All other components within  normal limits  COMPREHENSIVE METABOLIC PANEL WITH GFR - Abnormal; Notable for the following components:   Total Protein 6.3 (*)    All other components within normal limits  TROPONIN T, HIGH SENSITIVITY    EKG: None  Radiology: CT Cervical Spine Wo Contrast Result Date: 07/30/2024 EXAM: CT CERVICAL, THORACIC, AND LUMBAR SPINE WITHOUT INTRAVENOUS CONTRAST 07/30/2024 06:18:28 PM TECHNIQUE: CT of the cervical, thoracic and lumbar spine was performed without the administration of intravenous contrast. Multiplanar reformatted images are provided for review. Automated exposure control, iterative reconstruction, and/or weight based adjustment of the mA/kV was utilized to reduce the radiation dose to as low as reasonably achievable. COMPARISON: Thoracic spine MRI 03/11/2023 and prior imaging dated 04/26/2024. CLINICAL HISTORY: Bone lesion, cervical spine, incidental. FINDINGS: CERVICAL SPINE: BONES AND ALIGNMENT: There is no acute fracture or traumatic malalignment. DEGENERATIVE CHANGES: No significant degenerative changes. SOFT TISSUES: There is no prevertebral soft tissue swelling. THORACIC SPINE: BONE AND ALIGNMENT: There is a wedge compression fracture at T11 with 25% anterior height loss. This is chronic and unchanged compared to thoracic spine MRI 03/11/2023. The vertebral body heights are otherwise maintained. No osseous destructive lesion is seen. DEGENERATIVE CHANGES: No gross spinal canal stenosis or bony neural foraminal narrowing of the thoracic spine. SOFT TISSUES: No paraspinal mass or hematoma. LIMITED CHEST: Bilateral perihilar atelectasis. Calcific aortic atherosclerosis. LUMBAR SPINE: BONES AND ALIGNMENT: There are chronic compression fractures at L1, L3, L4, and L5, unchanged since 04/26/2024. The vertebral body heights are otherwise maintained. No osseous destructive lesion is seen. DEGENERATIVE CHANGES: No gross spinal canal stenosis or bony neural foraminal narrowing of the lumbar spine.  SOFT TISSUES: No paraspinal mass or hematoma. LIMITED RETROPERITONEUM: Limited images of the retroperitoneum demonstrate no acute abnormality. Incidental adrenal and/or renal findings do not require follow up imaging. IMPRESSION: 1.  No acute findings. 2. Chronic wedge compression fracture at T11 with 25% anterior height loss, unchanged compared to thoracic spine MRI 03/11/2023. 3. Chronic compression fractures at L1, L3, L4, and L5, unchanged since 04/26/2024. Electronically signed by: Franky Stanford MD 07/30/2024 07:23 PM EST RP Workstation: [id number]   CT Thoracic Spine Wo Contrast Result Date: 07/30/2024 EXAM: CT CERVICAL, THORACIC, AND LUMBAR SPINE WITHOUT INTRAVENOUS CONTRAST 07/30/2024 06:18:28 PM TECHNIQUE: CT of the cervical, thoracic and lumbar spine was performed without the administration of intravenous contrast. Multiplanar reformatted images are provided for review. Automated exposure control, iterative reconstruction, and/or weight based adjustment of the mA/kV was utilized to reduce the radiation dose to as low as reasonably achievable. COMPARISON: Thoracic spine MRI 03/11/2023 and prior imaging dated 04/26/2024. CLINICAL HISTORY: Bone lesion, cervical spine, incidental. FINDINGS: CERVICAL SPINE: BONES AND ALIGNMENT: There is no acute fracture or traumatic malalignment. DEGENERATIVE CHANGES: No significant degenerative changes. SOFT TISSUES: There is no prevertebral soft tissue swelling. THORACIC SPINE: BONE AND ALIGNMENT: There is a wedge compression fracture at T11 with 25% anterior height loss. This is chronic and unchanged compared to thoracic spine MRI 03/11/2023. The vertebral body heights are otherwise maintained. No osseous destructive lesion is seen. DEGENERATIVE CHANGES: No gross spinal canal stenosis or bony neural foraminal narrowing of the thoracic spine. SOFT TISSUES: No paraspinal mass or hematoma. LIMITED CHEST: Bilateral perihilar atelectasis. Calcific aortic atherosclerosis. LUMBAR  SPINE: BONES AND ALIGNMENT: There are chronic compression fractures at L1, L3, L4, and L5, unchanged since 04/26/2024. The vertebral body heights are otherwise maintained. No osseous destructive lesion is seen. DEGENERATIVE CHANGES: No gross spinal canal stenosis or bony neural foraminal narrowing of the lumbar spine. SOFT TISSUES: No paraspinal mass or hematoma. LIMITED RETROPERITONEUM: Limited images of the retroperitoneum demonstrate no acute abnormality. Incidental adrenal and/or renal findings do not require follow up imaging. IMPRESSION: 1. No acute findings. 2. Chronic wedge compression fracture at T11 with 25% anterior height loss, unchanged compared to thoracic spine MRI 03/11/2023. 3. Chronic compression fractures at L1, L3, L4, and L5, unchanged since 04/26/2024. Electronically signed by: Franky Stanford MD 07/30/2024 07:23 PM EST RP Workstation: [id number]   CT Lumbar Spine Wo Contrast Result Date: 07/30/2024 EXAM: CT CERVICAL, THORACIC, AND LUMBAR SPINE WITHOUT INTRAVENOUS CONTRAST 07/30/2024 06:18:28 PM TECHNIQUE: CT of the cervical, thoracic and lumbar spine was performed without the administration of intravenous contrast. Multiplanar reformatted images are provided for review. Automated exposure control, iterative reconstruction, and/or weight based adjustment of the mA/kV was utilized to reduce the radiation dose to as low as reasonably achievable. COMPARISON: Thoracic spine MRI 03/11/2023 and prior imaging dated 04/26/2024. CLINICAL HISTORY: Bone lesion, cervical spine, incidental. FINDINGS: CERVICAL SPINE: BONES AND ALIGNMENT: There is no acute fracture or traumatic malalignment. DEGENERATIVE CHANGES: No significant degenerative changes. SOFT TISSUES: There is no prevertebral soft tissue swelling. THORACIC SPINE: BONE AND ALIGNMENT: There is a wedge compression fracture at T11 with 25% anterior height loss. This is chronic and unchanged compared to thoracic spine MRI 03/11/2023. The vertebral body  heights are otherwise maintained. No osseous destructive lesion is seen. DEGENERATIVE CHANGES: No gross spinal canal stenosis or bony neural foraminal narrowing of the thoracic spine. SOFT TISSUES: No paraspinal mass or hematoma. LIMITED CHEST: Bilateral perihilar atelectasis. Calcific aortic atherosclerosis. LUMBAR SPINE: BONES AND ALIGNMENT: There are chronic compression fractures at L1, L3, L4, and L5, unchanged since 04/26/2024. The vertebral body heights are otherwise maintained. No osseous destructive lesion is seen. DEGENERATIVE CHANGES: No gross spinal canal stenosis or bony  neural foraminal narrowing of the lumbar spine. SOFT TISSUES: No paraspinal mass or hematoma. LIMITED RETROPERITONEUM: Limited images of the retroperitoneum demonstrate no acute abnormality. Incidental adrenal and/or renal findings do not require follow up imaging. IMPRESSION: 1. No acute findings. 2. Chronic wedge compression fracture at T11 with 25% anterior height loss, unchanged compared to thoracic spine MRI 03/11/2023. 3. Chronic compression fractures at L1, L3, L4, and L5, unchanged since 04/26/2024. Electronically signed by: Franky Stanford MD 07/30/2024 07:23 PM EST RP Workstation: [id number]   DG Cervical Spine 2 or 3 views Result Date: 07/30/2024 CLINICAL DATA:  Pain. EXAM: CERVICAL SPINE - 2-3 VIEW COMPARISON:  None Available. FINDINGS: Limited evaluation of the cervical spine due to sub-optimal positioning secondary to patient condition/reported pain. No appreciable acute abnormality. IMPRESSION: Limited evaluation of the cervical spine. No appreciable acute abnormality. Electronically Signed   By: Harrietta Sherry M.D.   On: 07/30/2024 16:43   DG Chest 2 View Result Date: 07/30/2024 EXAM: 2 VIEW(S) XRAY OF THE CHEST 07/30/2024 03:40:00 PM COMPARISON: 05/30/2024 CLINICAL HISTORY: Pain pain. FINDINGS: LUNGS AND PLEURA: Mild central pulmonary vascular congestion is noted. Left basilar atelectasis infiltrate with associated  effusion cannot be excluded. No pneumothorax. HEART AND MEDIASTINUM: Stable cardiomegaly. No acute abnormality of the mediastinal silhouette. BONES AND SOFT TISSUES: No acute osseous abnormality. IMPRESSION: 1. Stable cardiomegaly with mild central pulmonary vascular congestion. 2. Left basilar atelectasis; superimposed infiltrate with associated effusion cannot be excluded. Electronically signed by: Lynwood Seip MD 07/30/2024 04:03 PM EST RP Workstation: [id number]     Procedures   Medications Ordered in the ED  HYDROmorphone  (DILAUDID ) injection 1 mg (1 mg Intravenous Given 07/30/24 1548)  LORazepam  (ATIVAN ) injection 1 mg (1 mg Intravenous Given 07/30/24 1547)  HYDROmorphone  (DILAUDID ) injection 2 mg (2 mg Intravenous Given 07/30/24 1751)  HYDROmorphone  (DILAUDID ) injection 1 mg (1 mg Intravenous Given 07/30/24 2006)   CT scan of neck thoracic and lumbar spine did not show any acute changes                                 Medical Decision Making Amount and/or Complexity of Data Reviewed Labs: ordered. Radiology: ordered. ECG/medicine tests: ordered.  Risk Prescription drug management.   Exacerbation of her chronic pain in her back and her neck.  Patient is started on gabapentin  and will follow-up with her PCP and continue to take her narcotics     Final diagnoses:  Muscle spasm of back    ED Discharge Orders          Ordered    gabapentin  (NEURONTIN ) 300 MG capsule  4 times daily        07/30/24 1946               Suzette Pac, MD 07/31/24 1218

## 2024-07-31 NOTE — Progress Notes (Signed)
 [facility]  [phone]) [phone] [phone]   I connected with  Kathleen [name] on 07/31/24 by telephone and verified that I am speaking with the correct person using two identifiers.   I discussed the limitations of evaluation and management by telemedicine. The patient expressed understanding and agreed to proceed.   INITIAL HEMATOLOGY CONSULTATION  Referring Provider: Dr. Glendia Fielding   Reason for Referral: Iron  deficiency anemia  HPI: Kathleen [name] is a 63 year old female with multiple medical problems including sarcoidosis, fibromyalgia, sleep apnea, hyperlipidemia, hypothyroidism, interstitial lung disease, pulmonary hypertension, chronic respiratory failure with hypoxia, anxiety, depression, Addison's disease, acute on chronic heart failure with preserved ejection fraction.  The patient has a referred to hematology for evaluation of iron  deficiency anemia.  The patient's last CBC was completed on 07/30/2024 which showed a WBC of 10.3, hemoglobin 9.1, MCV 89.7, platelets 447,000.  CMP from the same date was normal with exception of a mildly decreased total protein at 6.3.  Iron  studies were completed on 06/09/2024 which showed TIBC at 310, UIBC 272, iron  38, percent saturation 12%, ferritin 44.  Prior labs showed the patient has had intermittent anemia dating back to at least 2008.  The lowest hemoglobin on record was 7.3 in May 2017.  The reports that she has been experiencing significant fatigue.  She also reports back pain secondary to compression fractures.  She is not a surgical candidate due to her respiratory status.  The patient takes oxycodone  for pain and was in the emergency department yesterday due to worsening back pain and received Dilaudid  which was helpful.  Her pain medication is managed by [facility].  She reports headaches and dizziness.  She has shortness of breath at baseline and wears 5 L of oxygen .  She has pica for ice.  Denies  epistaxis, hemoptysis, hematemesis, hematuria, melena, hematochezia.  The patient reports that she has taken oral iron  and she thinks that she has had a blood transfusion at some point in the past but she is unclear of the details.  She does not recall any history of receiving IV iron .  She had an EGD performed on 09/11/2023 which showed a normal esophagus which was dilated, probable gastric mucosa with submucosal petechiae, normal duodenal bulb and second portion of the duodenum.  Last colonoscopy was completed on 01/03/2022 which showed 1 small polyp in the proximal ascending colon, external hemorrhoids.  Past Medical History:  Diagnosis Date   Adrenal insufficiency    Anemia    Anxiety    Avascular bone necrosis (HCC)    Breast fibrocystic disorder    Chronic pain    Encounter for long-term opiate analgesic use 02/02/2020   Patient has chronic pain with femoral head avascular necrosis.  Is under pain contract   Fibromyalgia    Gastroesophageal reflux disease    Hypothyroidism    Mitral valve prolapse    Orthostatic hypotension    Peripheral neuropathy    PONV (postoperative nausea and vomiting)    Restrictive lung disease    Moderate to severe   Sarcoidosis    Biopsy proven - [facility]   Sarcoidosis    SOB (shortness of breath)    chronic  :  Past Surgical History:  Procedure Laterality Date   BIOPSY  10/17/2018   Procedure: BIOPSY;  Surgeon: Golda Claudis PENNER, MD;  [facility]: [facility];  Service: Endoscopy;;  duodenum, antrum, gastric body   BIOPSY  03/03/2021   Procedure: BIOPSY;  Surgeon: Golda Claudis  U, MD;  [facility]: [facility];  Service: Endoscopy;;   BIOPSY  09/11/2023   Procedure: BIOPSY;  Surgeon: Shaaron Lamar HERO, MD;  [facility];  Service: Endoscopy;;   BREAST LUMPECTOMY Bilateral 01/12/2011   CHOLECYSTECTOMY N/A 04/17/2021   Procedure: LAPAROSCOPIC CHOLECYSTECTOMY;  Surgeon: Mavis Anes, MD;  Location: [facility];  Service: General;  Laterality: N/A;    COLONOSCOPY WITH PROPOFOL  N/A 01/03/2022   Procedure: COLONOSCOPY WITH PROPOFOL ;  Surgeon: Golda Claudis PENNER, MD;  [facility];  Service: Endoscopy;  Laterality: N/A;  220   ESOPHAGEAL DILATION N/A 03/03/2021   Procedure: ESOPHAGEAL DILATION;  Surgeon: Golda Claudis PENNER, MD;  [facility];  Service: Endoscopy;  Laterality: N/A;   ESOPHAGOGASTRODUODENOSCOPY (EGD) WITH PROPOFOL  N/A 10/17/2018   Procedure: ESOPHAGOGASTRODUODENOSCOPY (EGD) WITH PROPOFOL ;  Surgeon: Golda Claudis PENNER, MD;  [facility];  Service: Endoscopy;  Laterality: N/A;  2:25   ESOPHAGOGASTRODUODENOSCOPY (EGD) WITH PROPOFOL  N/A 03/03/2021   Procedure: ESOPHAGOGASTRODUODENOSCOPY (EGD) WITH PROPOFOL ;  Surgeon: Golda Claudis PENNER, MD;  [facility];  Service: Endoscopy;  Laterality: N/A;  12:15   ESOPHAGOGASTRODUODENOSCOPY (EGD) WITH PROPOFOL  N/A 09/11/2023   Procedure: ESOPHAGOGASTRODUODENOSCOPY (EGD) WITH PROPOFOL ;  Surgeon: Shaaron Lamar HERO, MD;  [facility];  Service: Endoscopy;  Laterality: N/A;  2:30 pm, asa 3, LM to see if pt can come earlier   [facility] DILATION N/A 09/11/2023   Procedure: AGAPITO DILATION;  Surgeon: Shaaron Lamar HERO, MD;  [facility];  Service: Endoscopy;  Laterality: N/A;   ORIF ANKLE FRACTURE Left 03/10/2023   Procedure: OPEN REDUCTION INTERNAL FIXATION (ORIF) ANKLE FRACTURE;  Surgeon: Georgina Ozell LABOR, MD;  Location: [facility] OR;  Service: Orthopedics;  Laterality: Left;   POLYPECTOMY  01/03/2022   Procedure: POLYPECTOMY INTESTINAL;  Surgeon: Golda Claudis PENNER, MD;  [facility];  Service: Endoscopy;;   RIGHT HEART CATH N/A 05/15/2024   Procedure: RIGHT HEART CATH;  Surgeon: Zenaida Morene PARAS, MD;  Location: [facility] INVASIVE CV LAB;  Service: Cardiovascular;  Laterality: N/A;   RIGHT HEART CATH N/A 05/18/2024   Procedure: RIGHT HEART CATH;  Surgeon: Rolan Ezra RAMAN, MD;  Location: [facility] INVASIVE CV LAB;  Service: Cardiovascular;  Laterality: N/A;    TUBAL LIGATION    :  CURRENT MEDS: Current Outpatient Medications  Medication Sig Dispense Refill   acetaminophen  (TYLENOL ) 325 MG tablet Take 2 tablets (650 mg total) by mouth every 6 (six) hours as needed for mild pain (pain score 1-3) or fever (or Fever >/= 101).     alendronate  (FOSAMAX ) 70 MG tablet Take 1 tablet (70 mg total) by mouth every Saturday. Take with a full glass of water  on an empty stomach. 5 tablet 8   apixaban  (ELIQUIS ) 5 MG TABS tablet Take 1 tablet (5 mg total) by mouth 2 (two) times daily. 60 tablet 0   buprenorphine  (BUTRANS ) 7.5 MCG/HR Apply 1 patch onto skin weekly, change every 7 days 4 patch 0   busPIRone  (BUSPAR ) 15 MG tablet Take 1 tablet (15 mg) by mouth 3 times daily. 90 tablet 5   calcitonin, salmon, (MIACALCIN /FORTICAL) 200 UNIT/ACT nasal spray Place 1 spray into alternate nostrils daily. 3.7 mL 0   Calcium  Citrate 333 MG TABS Take one tablet by mouth 3 (three) times daily with meals. 270 tablet 1   Capsaicin -Menthol -Methyl Sal (CAPSAICIN -METHYL SAL-MENTHOL ) 0.025-1-12 % CREA Apply 1 Application topically 3 (three) times daily as needed. 56.6 g 0   ferrous sulfate  (FEROSUL)  325 (65 FE) MG tablet Take 1 tablet (325 mg total) by mouth once a week. 4 tablet 0   folic acid  (FOLVITE ) 1 MG tablet Take 1 tablet (1 mg total) by mouth daily. 30 tablet 0   gabapentin  (NEURONTIN ) 300 MG capsule Take 1 capsule (300 mg total) by mouth 4 (four) times daily. 100 capsule 0   hydrocortisone  (CORTEF ) 10 MG tablet Take 2 tablets by mouth at 8:00 am and 1 tablet at lunch. (May double dose on sick days x 3-5 days) 120 tablet 3   hydrOXYzine  (ATARAX ) 10 MG tablet Take 1 tablet (10 mg total) by mouth 3 (three) times daily as needed for anxiety 45 tablet 1   levothyroxine  (SYNTHROID ) 50 MCG tablet Take 1 tablet by mouth daily before breakfast. 30 tablet 5   linaclotide  (LINZESS ) 145 MCG CAPS capsule Take 1 capsule (145 mcg total) by mouth daily before breakfast. 30 capsule 3    midodrine  (PROAMATINE ) 5 MG tablet Take 1 tablet (5 mg total) by mouth 2 (two) times daily with a meal. 60 tablet 5   ondansetron  (ZOFRAN -ODT) 8 MG disintegrating tablet Dissolve 1 tablet (8 mg total) by mouth every 8 (eight) hours as needed. (Patient taking differently: Take 8 mg by mouth every 8 (eight) hours as needed for nausea or vomiting.) 15 tablet 5   oxyCODONE  (ROXICODONE ) 15 MG immediate release tablet Take 1 tablet (15 mg total) by mouth every 4 (four) hours as needed for pain 180 tablet 0   oxyCODONE  (ROXICODONE ) 15 MG immediate release tablet Take 1 tablet (15 mg total) by mouth every 4 (four) hours. 06/06/24 180 tablet 0   oxyCODONE  (ROXICODONE ) 15 MG immediate release tablet Take 1 tablet (15 mg total) by mouth every 4 (four) hours as needed for pain. 180 tablet 0   OXYGEN  Inhale 5 L into the lungs continuous.     pantoprazole  (PROTONIX ) 40 MG tablet Take 1 tablet (40 mg total) by mouth 2 (two) times daily. 60 tablet 5   polyethylene glycol (MIRALAX  / GLYCOLAX ) 17 g packet Take 17 g by mouth 2 (two) times daily. 60 each 4   potassium chloride  (KLOR-CON  M) 10 MEQ tablet Take 1 tablet (10 mEq total) by mouth 2 (two) times daily. 60 tablet 5   revefenacin  (YUPELRI ) 175 MCG/3ML nebulizer solution Take 3 mLs (175 mcg total) by nebulization daily. 90 mL 0   rosuvastatin  (CRESTOR ) 20 MG tablet Take 1 tablet (20 mg total) by mouth daily. 30 tablet 5   Semaglutide ,0.25 or 0.5MG /DOS, (OZEMPIC , 0.25 OR 0.5 MG/DOSE,) 2 MG/3ML SOPN Inject 0.25 mg into the skin once a week. 9 mL 0   senna-docusate (SENOKOT-S) 8.6-50 MG tablet Take 2 tablets by mouth at bedtime. 60 tablet 3   topiramate  (TOPAMAX ) 50 MG tablet Take 1 tablet (50 mg total) by mouth daily with lunch. 90 tablet 1   torsemide  (DEMADEX ) 20 MG tablet Take 2 tablets by mouth every morning, may take 1 additional torsemide  at 1 PM when weight gain is more than 2 pounds in 1 day 90 tablet 5   Vitamin D , Ergocalciferol , (DRISDOL ) 1.25 MG (50000  UNIT) CAPS capsule Take 1 capsule (50,000 Units total) by mouth every Sunday. 5 capsule 0   No current facility-administered medications for this visit.   Allergies  Allergen Reactions   Bee Venom Anaphylaxis   Contrast Media [Iodinated Contrast Media] Other (See Comments)    CT chest w/ contrast at OSH followed by SOB resolved w/ single dose steroids,  never ENT swelling, intubation or pressors, has had multiple contrasted scans since   Bactrim  [Sulfamethoxazole -Trimethoprim ] Nausea And Vomiting   Codeine Itching  :  Family History  Problem Relation Age of Onset   Cardiomyopathy Mother    Breast cancer Mother    Prostate cancer Father    Heart attack Maternal Grandmother    Heart attack Maternal Grandfather    Heart attack Paternal Grandmother    Bipolar disorder Daughter    Heart disease Maternal Aunt    Neurofibromatosis Cousin    Colon cancer Neg Hx   :  Social History   Socioeconomic History   Marital status: Married    Spouse name: Not on file   Number of children: 3   Years of education: Not on file   Highest education level: Not on file  Occupational History   Occupation: Educational Psychologist: [employer]  Tobacco Use   Smoking status: Never    Passive exposure: Never   Smokeless tobacco: Never  Vaping Use   Vaping status: Never Used  Substance and Sexual Activity   Alcohol use: No   Drug use: No   Sexual activity: Yes    Birth control/protection: Post-menopausal  Other Topics Concern   Not on file  Social History Narrative   Daily caffeine    Living with husband   Right handed   She has been on disability since 2014.    Social Drivers of Corporate Investment Banker Strain: Low Risk  (09/27/2023)   Overall Financial Resource Strain (CARDIA)    Difficulty of Paying Living Expenses: Not hard at all  Food Insecurity: No Food Insecurity (05/11/2024)   Hunger Vital Sign    Worried About Running Out of Food in the Last Year: Never true    Ran  Out of Food in the Last Year: Never true  Transportation Needs: No Transportation Needs (05/11/2024)   PRAPARE - Administrator, Civil Service (Medical): No    Lack of Transportation (Non-Medical): No  Physical Activity: Inactive (09/27/2023)   Exercise Vital Sign    Days of Exercise per Week: 0 days    Minutes of Exercise per Session: 0 min  Stress: No Stress Concern Present (09/27/2023)   [employer] of Occupational Health - Occupational Stress Questionnaire    Feeling of Stress : Only a little  Social Connections: Socially Integrated (01/21/2024)   Social Connection and Isolation Panel    Frequency of Communication with Friends and Family: More than three times a week    Frequency of Social Gatherings with Friends and Family: More than three times a week    Attends Religious Services: More than 4 times per year    Active Member of [employer] or Organizations: Yes    Attends Engineer, Structural: More than 4 times per year    Marital Status: Married  Catering Manager Violence: Not At Risk (05/11/2024)   Humiliation, Afraid, Rape, and Kick questionnaire    Fear of Current or Ex-Partner: No    Emotionally Abused: No    Physically Abused: No    Sexually Abused: No  :  REVIEW OF SYSTEMS:  A comprehensive 14 point review of systems was negative except as noted in the HPI.    Exam: LMP 05/17/2016   Physical Exam Nursing note reviewed.  Pulmonary:     Comments: Speaks in full sentences. Neurological:     Mental Status: She is alert and oriented to person, place, and  time.  Psychiatric:        Mood and Affect: Mood normal.        Behavior: Behavior normal.        Thought Content: Thought content normal.        Judgment: Judgment normal.    LABS:  Lab Results  Component Value Date   WBC 10.3 07/30/2024   HGB 9.1 (L) 07/30/2024   HCT 30.6 (L) 07/30/2024   PLT 447 (H) 07/30/2024   GLUCOSE 95 07/30/2024   CHOL 115 01/24/2024   TRIG 133 01/24/2024   HDL  55 01/24/2024   LDLCALC 37 01/24/2024   ALT 15 07/30/2024   AST 26 07/30/2024   NA 142 07/30/2024   K 3.7 07/30/2024   CL 103 07/30/2024   CREATININE 0.95 07/30/2024   BUN 11 07/30/2024   CO2 32 07/30/2024   INR 1.01 09/02/2012   HGBA1C 5.9 03/17/2024    CT Cervical Spine Wo Contrast Result Date: 07/30/2024 EXAM: CT CERVICAL, THORACIC, AND LUMBAR SPINE WITHOUT INTRAVENOUS CONTRAST 07/30/2024 06:18:28 PM TECHNIQUE: CT of the cervical, thoracic and lumbar spine was performed without the administration of intravenous contrast. Multiplanar reformatted images are provided for review. Automated exposure control, iterative reconstruction, and/or weight based adjustment of the mA/kV was utilized to reduce the radiation dose to as low as reasonably achievable. COMPARISON: Thoracic spine MRI 03/11/2023 and prior imaging dated 04/26/2024. CLINICAL HISTORY: Bone lesion, cervical spine, incidental. FINDINGS: CERVICAL SPINE: BONES AND ALIGNMENT: There is no acute fracture or traumatic malalignment. DEGENERATIVE CHANGES: No significant degenerative changes. SOFT TISSUES: There is no prevertebral soft tissue swelling. THORACIC SPINE: BONE AND ALIGNMENT: There is a wedge compression fracture at T11 with 25% anterior height loss. This is chronic and unchanged compared to thoracic spine MRI 03/11/2023. The vertebral body heights are otherwise maintained. No osseous destructive lesion is seen. DEGENERATIVE CHANGES: No gross spinal canal stenosis or bony neural foraminal narrowing of the thoracic spine. SOFT TISSUES: No paraspinal mass or hematoma. LIMITED CHEST: Bilateral perihilar atelectasis. Calcific aortic atherosclerosis. LUMBAR SPINE: BONES AND ALIGNMENT: There are chronic compression fractures at L1, L3, L4, and L5, unchanged since 04/26/2024. The vertebral body heights are otherwise maintained. No osseous destructive lesion is seen. DEGENERATIVE CHANGES: No gross spinal canal stenosis or bony neural foraminal  narrowing of the lumbar spine. SOFT TISSUES: No paraspinal mass or hematoma. LIMITED RETROPERITONEUM: Limited images of the retroperitoneum demonstrate no acute abnormality. Incidental adrenal and/or renal findings do not require follow up imaging. IMPRESSION: 1. No acute findings. 2. Chronic wedge compression fracture at T11 with 25% anterior height loss, unchanged compared to thoracic spine MRI 03/11/2023. 3. Chronic compression fractures at L1, L3, L4, and L5, unchanged since 04/26/2024. Electronically signed by: Franky Stanford MD 07/30/2024 07:23 PM EST RP Workstation: [id number]   CT Thoracic Spine Wo Contrast Result Date: 07/30/2024 EXAM: CT CERVICAL, THORACIC, AND LUMBAR SPINE WITHOUT INTRAVENOUS CONTRAST 07/30/2024 06:18:28 PM TECHNIQUE: CT of the cervical, thoracic and lumbar spine was performed without the administration of intravenous contrast. Multiplanar reformatted images are provided for review. Automated exposure control, iterative reconstruction, and/or weight based adjustment of the mA/kV was utilized to reduce the radiation dose to as low as reasonably achievable. COMPARISON: Thoracic spine MRI 03/11/2023 and prior imaging dated 04/26/2024. CLINICAL HISTORY: Bone lesion, cervical spine, incidental. FINDINGS: CERVICAL SPINE: BONES AND ALIGNMENT: There is no acute fracture or traumatic malalignment. DEGENERATIVE CHANGES: No significant degenerative changes. SOFT TISSUES: There is no prevertebral soft tissue swelling. THORACIC SPINE: BONE AND ALIGNMENT:  There is a wedge compression fracture at T11 with 25% anterior height loss. This is chronic and unchanged compared to thoracic spine MRI 03/11/2023. The vertebral body heights are otherwise maintained. No osseous destructive lesion is seen. DEGENERATIVE CHANGES: No gross spinal canal stenosis or bony neural foraminal narrowing of the thoracic spine. SOFT TISSUES: No paraspinal mass or hematoma. LIMITED CHEST: Bilateral perihilar atelectasis. Calcific  aortic atherosclerosis. LUMBAR SPINE: BONES AND ALIGNMENT: There are chronic compression fractures at L1, L3, L4, and L5, unchanged since 04/26/2024. The vertebral body heights are otherwise maintained. No osseous destructive lesion is seen. DEGENERATIVE CHANGES: No gross spinal canal stenosis or bony neural foraminal narrowing of the lumbar spine. SOFT TISSUES: No paraspinal mass or hematoma. LIMITED RETROPERITONEUM: Limited images of the retroperitoneum demonstrate no acute abnormality. Incidental adrenal and/or renal findings do not require follow up imaging. IMPRESSION: 1. No acute findings. 2. Chronic wedge compression fracture at T11 with 25% anterior height loss, unchanged compared to thoracic spine MRI 03/11/2023. 3. Chronic compression fractures at L1, L3, L4, and L5, unchanged since 04/26/2024. Electronically signed by: Franky Stanford MD 07/30/2024 07:23 PM EST RP Workstation: [id number]   CT Lumbar Spine Wo Contrast Result Date: 07/30/2024 EXAM: CT CERVICAL, THORACIC, AND LUMBAR SPINE WITHOUT INTRAVENOUS CONTRAST 07/30/2024 06:18:28 PM TECHNIQUE: CT of the cervical, thoracic and lumbar spine was performed without the administration of intravenous contrast. Multiplanar reformatted images are provided for review. Automated exposure control, iterative reconstruction, and/or weight based adjustment of the mA/kV was utilized to reduce the radiation dose to as low as reasonably achievable. COMPARISON: Thoracic spine MRI 03/11/2023 and prior imaging dated 04/26/2024. CLINICAL HISTORY: Bone lesion, cervical spine, incidental. FINDINGS: CERVICAL SPINE: BONES AND ALIGNMENT: There is no acute fracture or traumatic malalignment. DEGENERATIVE CHANGES: No significant degenerative changes. SOFT TISSUES: There is no prevertebral soft tissue swelling. THORACIC SPINE: BONE AND ALIGNMENT: There is a wedge compression fracture at T11 with 25% anterior height loss. This is chronic and unchanged compared to thoracic spine MRI  03/11/2023. The vertebral body heights are otherwise maintained. No osseous destructive lesion is seen. DEGENERATIVE CHANGES: No gross spinal canal stenosis or bony neural foraminal narrowing of the thoracic spine. SOFT TISSUES: No paraspinal mass or hematoma. LIMITED CHEST: Bilateral perihilar atelectasis. Calcific aortic atherosclerosis. LUMBAR SPINE: BONES AND ALIGNMENT: There are chronic compression fractures at L1, L3, L4, and L5, unchanged since 04/26/2024. The vertebral body heights are otherwise maintained. No osseous destructive lesion is seen. DEGENERATIVE CHANGES: No gross spinal canal stenosis or bony neural foraminal narrowing of the lumbar spine. SOFT TISSUES: No paraspinal mass or hematoma. LIMITED RETROPERITONEUM: Limited images of the retroperitoneum demonstrate no acute abnormality. Incidental adrenal and/or renal findings do not require follow up imaging. IMPRESSION: 1. No acute findings. 2. Chronic wedge compression fracture at T11 with 25% anterior height loss, unchanged compared to thoracic spine MRI 03/11/2023. 3. Chronic compression fractures at L1, L3, L4, and L5, unchanged since 04/26/2024. Electronically signed by: Franky Stanford MD 07/30/2024 07:23 PM EST RP Workstation: [id number]   DG Cervical Spine 2 or 3 views Result Date: 07/30/2024 CLINICAL DATA:  Pain. EXAM: CERVICAL SPINE - 2-3 VIEW COMPARISON:  None Available. FINDINGS: Limited evaluation of the cervical spine due to sub-optimal positioning secondary to patient condition/reported pain. No appreciable acute abnormality. IMPRESSION: Limited evaluation of the cervical spine. No appreciable acute abnormality. Electronically Signed   By: Harrietta Sherry M.D.   On: 07/30/2024 16:43   DG Chest 2 View Result Date: 07/30/2024 EXAM: 2 VIEW(S) XRAY OF  THE CHEST 07/30/2024 03:40:00 PM COMPARISON: 05/30/2024 CLINICAL HISTORY: Pain pain. FINDINGS: LUNGS AND PLEURA: Mild central pulmonary vascular congestion is noted. Left basilar  atelectasis infiltrate with associated effusion cannot be excluded. No pneumothorax. HEART AND MEDIASTINUM: Stable cardiomegaly. No acute abnormality of the mediastinal silhouette. BONES AND SOFT TISSUES: No acute osseous abnormality. IMPRESSION: 1. Stable cardiomegaly with mild central pulmonary vascular congestion. 2. Left basilar atelectasis; superimposed infiltrate with associated effusion cannot be excluded. Electronically signed by: Lynwood Seip MD 07/30/2024 04:03 PM EST RP Workstation: [id number]    ASSESSMENT AND PLAN:   1.  Iron  deficiency anemia.  The patient's lab work has been reviewed and she has had longstanding anemia.  The most recent iron  studies are consistent with iron  deficiency.  She has taken oral iron  without significant improvement.  The etiology of her iron  deficiency is unclear.  GI workup was negative and did not find a source of bleeding.  She has not noticed any significant bleeding.  Could have an element of poor absorption of the iron .  Today, we discussed at length treatment options including continuing oral iron  at a higher dose versus proceeding with IV iron .  She prefers to proceed with IV iron .  I have discussed the potential adverse effects of this treatment including the possibility of infusion reaction, nausea, arthralgias.  I discussed that she will receive premeds to minimize the risk of reaction and will be monitored closely during the infusion.  She agrees to proceed.  Will plan to recheck lab work in about 3 months.  2.  Chronic back pain/compression fractures.  The patient is followed at the pain clinic for chronic back pain.  She has compression fractures but is not a surgical candidate due to her respiratory failure.  She is on oxycodone  for pain.  She will continue to follow with the pain clinic.  3.  Chronic respiratory failure with hypoxia.  The patient is on 5 L of oxygen  at baseline.  She is followed by pulmonary at [facility].  Follow-up: IV iron  once  approved by insurance.  Lab/office visit in 3 months.  Thank you for this referral.  Alleigh Mollica, DNP, AGPCNP-BC, AOCNP

## 2024-08-01 ENCOUNTER — Other Ambulatory Visit (HOSPITAL_COMMUNITY): Payer: Self-pay

## 2024-08-03 ENCOUNTER — Other Ambulatory Visit (HOSPITAL_COMMUNITY): Payer: Self-pay

## 2024-08-03 ENCOUNTER — Other Ambulatory Visit: Payer: Self-pay | Admitting: Family Medicine

## 2024-08-04 ENCOUNTER — Other Ambulatory Visit: Payer: Self-pay | Admitting: Family Medicine

## 2024-08-04 ENCOUNTER — Other Ambulatory Visit: Payer: Self-pay

## 2024-08-04 ENCOUNTER — Other Ambulatory Visit (HOSPITAL_COMMUNITY): Payer: Self-pay

## 2024-08-04 ENCOUNTER — Telehealth: Payer: Self-pay

## 2024-08-04 DIAGNOSIS — Z6837 Body mass index (BMI) 37.0-37.9, adult: Secondary | ICD-10-CM | POA: Diagnosis not present

## 2024-08-04 DIAGNOSIS — M545 Low back pain, unspecified: Secondary | ICD-10-CM | POA: Diagnosis not present

## 2024-08-04 DIAGNOSIS — S32000S Wedge compression fracture of unspecified lumbar vertebra, sequela: Secondary | ICD-10-CM | POA: Diagnosis not present

## 2024-08-04 DIAGNOSIS — M810 Age-related osteoporosis without current pathological fracture: Secondary | ICD-10-CM | POA: Diagnosis not present

## 2024-08-04 DIAGNOSIS — J984 Other disorders of lung: Secondary | ICD-10-CM | POA: Diagnosis not present

## 2024-08-04 DIAGNOSIS — Z79899 Other long term (current) drug therapy: Secondary | ICD-10-CM | POA: Diagnosis not present

## 2024-08-04 MED ORDER — BUPRENORPHINE 15 MCG/HR TD PTWK
MEDICATED_PATCH | TRANSDERMAL | 0 refills | Status: AC
  Filled 2024-08-04: qty 4, 28d supply, fill #0

## 2024-08-04 MED ORDER — OXYCODONE HCL 15 MG PO TABS
15.0000 mg | ORAL_TABLET | ORAL | 0 refills | Status: DC | PRN
  Filled 2024-08-04: qty 180, 30d supply, fill #0

## 2024-08-04 NOTE — Progress Notes (Unsigned)
 Complex Care Management Note Care Guide Note  08/04/2024 Name: Kathleen [name] MRN: [id number] DOB: [birth year]   Complex Care Management Outreach Attempts: An unsuccessful telephone outreach was attempted today to offer the patient information about available complex care management services.  Follow Up Plan:  Additional outreach attempts will be made to offer the patient complex care management information and services.   Encounter Outcome:  No Answer  Dreama [facility]  [facility], [facility] VBCI Assistant Direct Dial: [phone]  Fax: [phone]

## 2024-08-05 ENCOUNTER — Other Ambulatory Visit: Payer: Self-pay | Admitting: Family Medicine

## 2024-08-05 ENCOUNTER — Other Ambulatory Visit (HOSPITAL_COMMUNITY): Payer: Self-pay

## 2024-08-05 ENCOUNTER — Other Ambulatory Visit: Payer: Self-pay

## 2024-08-05 ENCOUNTER — Encounter: Payer: Self-pay | Admitting: Oncology

## 2024-08-05 MED ORDER — ONDANSETRON 8 MG PO TBDP
8.0000 mg | ORAL_TABLET | Freq: Three times a day (TID) | ORAL | 5 refills | Status: AC | PRN
  Filled 2024-08-05: qty 15, 5d supply, fill #0
  Filled 2024-08-31 (×2): qty 15, 5d supply, fill #1
  Filled 2024-09-11: qty 15, 5d supply, fill #2
  Filled 2024-09-18: qty 15, 5d supply, fill #3
  Filled 2024-09-22: qty 15, 5d supply, fill #4
  Filled 2024-09-30: qty 15, 5d supply, fill #5

## 2024-08-06 ENCOUNTER — Other Ambulatory Visit (HOSPITAL_COMMUNITY): Payer: Self-pay

## 2024-08-06 ENCOUNTER — Inpatient Hospital Stay

## 2024-08-06 VITALS — BP 121/63 | HR 85 | Temp 98.0°F | Resp 18

## 2024-08-06 DIAGNOSIS — J9611 Chronic respiratory failure with hypoxia: Secondary | ICD-10-CM | POA: Diagnosis not present

## 2024-08-06 DIAGNOSIS — Z8042 Family history of malignant neoplasm of prostate: Secondary | ICD-10-CM | POA: Diagnosis not present

## 2024-08-06 DIAGNOSIS — Z803 Family history of malignant neoplasm of breast: Secondary | ICD-10-CM | POA: Diagnosis not present

## 2024-08-06 DIAGNOSIS — D508 Other iron deficiency anemias: Secondary | ICD-10-CM

## 2024-08-06 DIAGNOSIS — M4850XA Collapsed vertebra, not elsewhere classified, site unspecified, initial encounter for fracture: Secondary | ICD-10-CM | POA: Diagnosis not present

## 2024-08-06 DIAGNOSIS — D509 Iron deficiency anemia, unspecified: Secondary | ICD-10-CM | POA: Diagnosis present

## 2024-08-06 DIAGNOSIS — G894 Chronic pain syndrome: Secondary | ICD-10-CM | POA: Diagnosis not present

## 2024-08-06 MED ORDER — METHYLPREDNISOLONE SODIUM SUCC 125 MG IJ SOLR
125.0000 mg | Freq: Once | INTRAMUSCULAR | Status: AC
  Administered 2024-08-06: 125 mg via INTRAVENOUS
  Filled 2024-08-06: qty 2

## 2024-08-06 MED ORDER — CETIRIZINE HCL 10 MG/ML IV SOLN
10.0000 mg | Freq: Once | INTRAVENOUS | Status: AC
  Administered 2024-08-06: 10 mg via INTRAVENOUS
  Filled 2024-08-06: qty 1

## 2024-08-06 MED ORDER — LINACLOTIDE 145 MCG PO CAPS
145.0000 ug | ORAL_CAPSULE | Freq: Every day | ORAL | 3 refills | Status: AC
  Filled 2024-08-06: qty 30, 30d supply, fill #0

## 2024-08-06 MED ORDER — SODIUM CHLORIDE 0.9 % IV SOLN: INTRAVENOUS | Status: DC

## 2024-08-06 MED ORDER — SODIUM CHLORIDE 0.9 % IV SOLN
950.0000 mg | Freq: Once | INTRAVENOUS | Status: AC
  Administered 2024-08-06: 950 mg via INTRAVENOUS
  Filled 2024-08-06: qty 19

## 2024-08-06 MED ORDER — ACETAMINOPHEN 325 MG PO TABS
650.0000 mg | ORAL_TABLET | Freq: Once | ORAL | Status: AC
  Administered 2024-08-06: 650 mg via ORAL
  Filled 2024-08-06: qty 2

## 2024-08-06 MED ORDER — SODIUM CHLORIDE 0.9 % IV SOLN
50.0000 mg | Freq: Once | INTRAVENOUS | Status: AC
  Administered 2024-08-06: 50 mg via INTRAVENOUS
  Filled 2024-08-06: qty 1

## 2024-08-06 MED ORDER — FAMOTIDINE IN NACL 20-0.9 MG/50ML-% IV SOLN
20.0000 mg | Freq: Once | INTRAVENOUS | Status: AC
  Administered 2024-08-06: 20 mg via INTRAVENOUS
  Filled 2024-08-06: qty 50

## 2024-08-06 NOTE — Progress Notes (Signed)
 Patient presents today for iron  infusion.  Patient is in satisfactory condition with no new complaints voiced.  Vital signs are stable.  We will proceed with infusion per provider orders.    Peripheral IV started with good blood return pre and post infusion.  Infed 1,000 given today per MD orders. Tolerated infusion without adverse affects. Vital signs stable. No complaints at this time. Discharged from clinic ambulatory in stable condition. Alert and oriented x 3. F/U with [facility] as scheduled.

## 2024-08-06 NOTE — Patient Instructions (Signed)
 [facility] [place] - A DEPT OF [facility]  Discharge Instructions: Thank you for choosing [facility] to provide your oncology and hematology care.  If you have a lab appointment with the Cancer Center - please note that after April 8th, 2024, all labs will be drawn in the cancer center.  You do not have to check in or register with the main entrance as you have in the past but will complete your check-in in the cancer center.  Wear comfortable clothing and clothing appropriate for easy access to any Portacath or PICC line.   We strive to give you quality time with your provider. You may need to reschedule your appointment if you arrive late (15 or more minutes).  Arriving late affects you and other patients whose appointments are after yours.  Also, if you miss three or more appointments without notifying the office, you may be dismissed from the clinic at the providers discretion.      For prescription refill requests, have your pharmacy contact our office and allow 72 hours for refills to be completed.    Today you received the following chemotherapy and/or immunotherapy agents InFed.  Iron  Dextran Injection What is this medication? IRON  DEXTRAN (EYE ern DEX tran) treats low levels of iron  in your body. Iron  is a mineral that plays an important role in making red blood cells, which carry oxygen  from your lungs to the rest of your body. This medicine may be used for other purposes; ask your health care provider or pharmacist if you have questions. COMMON BRAND NAME(S): Dexferrum, INFeD What should I tell my care team before I take this medication? They need to know if you have any of these conditions: Anemia not caused by low iron  levels Heart disease High levels of iron  in the blood Kidney disease Liver disease An unusual or allergic reaction to iron , other medications, foods, dyes, or preservatives Pregnant or trying to get  pregnant Breastfeeding How should I use this medication? This medication is injected into a vein or a muscle. It is given by your care team in a hospital or clinic setting. Talk to your care team about the use of this medication in children. While it may be prescribed for children as young as 4 months for selected conditions, precautions do apply. Overdosage: If you think you have taken too much of this medicine contact a poison control center or emergency room at once. NOTE: This medicine is only for you. Do not share this medicine with others. What if I miss a dose? Keep appointments for follow-up doses. It is important not to miss your dose. Call your care team if you are unable to keep an appointment. What may interact with this medication? Do not take this medication with any of the following: Deferoxamine Dimercaprol Other iron  products This medication may also interact with the following: Chloramphenicol Deferasirox This list may not describe all possible interactions. Give your health care provider a list of all the medicines, herbs, non-prescription drugs, or dietary supplements you use. Also tell them if you smoke, drink alcohol, or use illegal drugs. Some items may interact with your medicine. What should I watch for while using this medication? Visit your care team for regular checks on your progress. Tell your care team if your symptoms do not start to get better or if they get worse. You may need blood work while taking this medication. You may need to eat more foods that  contain iron . Talk to your care team. Foods that contain iron  include whole grains/cereals, dried fruits, beans, peas, leafy green vegetables, and organ meats (liver, kidney). Long-term use of this medication may increase your risk of some cancers. Talk to your care team about your risk of cancer. What side effects may I notice from receiving this medication? Side effects that you should report to your care  team as soon as possible: Allergic reactions--skin rash, itching, hives, swelling of the face, lips, tongue, or throat Low blood pressure--dizziness, feeling faint or lightheaded, blurry vision Shortness of breath Side effects that usually do not require medical attention (report to your care team if they continue or are bothersome): Flushing Headache Joint pain Muscle pain Nausea Pain, redness, or irritation at injection site This list may not describe all possible side effects. Call your doctor for medical advice about side effects. You may report side effects to FDA at 1-800-FDA-1088. Where should I keep my medication? This medication is given in a hospital or clinic. It will not be stored at home. NOTE: This sheet is a summary. It may not cover all possible information. If you have questions about this medicine, talk to your doctor, pharmacist, or health care provider.  2024 Elsevier/Gold Standard (2023-04-03 00:00:00)      To help prevent nausea and vomiting after your treatment, we encourage you to take your nausea medication as directed.  BELOW ARE SYMPTOMS THAT SHOULD BE REPORTED IMMEDIATELY: *FEVER GREATER THAN 100.4 F (38 C) OR HIGHER *CHILLS OR SWEATING *NAUSEA AND VOMITING THAT IS NOT CONTROLLED WITH YOUR NAUSEA MEDICATION *UNUSUAL SHORTNESS OF BREATH *UNUSUAL BRUISING OR BLEEDING *URINARY PROBLEMS (pain or burning when urinating, or frequent urination) *BOWEL PROBLEMS (unusual diarrhea, constipation, pain near the anus) TENDERNESS IN MOUTH AND THROAT WITH OR WITHOUT PRESENCE OF ULCERS (sore throat, sores in mouth, or a toothache) UNUSUAL RASH, SWELLING OR PAIN  UNUSUAL VAGINAL DISCHARGE OR ITCHING   Items with * indicate a potential emergency and should be followed up as soon as possible or go to the Emergency Department if any problems should occur.  Please show the CHEMOTHERAPY ALERT CARD or IMMUNOTHERAPY ALERT CARD at check-in to the Emergency Department and triage  nurse.  Should you have questions after your visit or need to cancel or reschedule your appointment, please contact [facility] [place] - A DEPT OF [facility] [phone]  and follow the prompts.  Office hours are 8:00 a.m. to 4:30 p.m. Monday - Friday. Please note that voicemails left after 4:00 p.m. may not be returned until the following business day.  We are closed weekends and major holidays. You have access to a nurse at all times for urgent questions. Please call the main number to the clinic [phone] and follow the prompts.  For any non-urgent questions, you may also contact your provider using MyChart. We now offer e-Visits for anyone 26 and older to request care online for non-urgent symptoms. For details visit [email or web address].   Also download the MyChart app! Go to the app store, search MyChart, open the app, select [place], and log in with your MyChart username and password.

## 2024-08-07 DIAGNOSIS — M542 Cervicalgia: Secondary | ICD-10-CM | POA: Diagnosis not present

## 2024-08-10 ENCOUNTER — Other Ambulatory Visit: Payer: Self-pay | Admitting: Family Medicine

## 2024-08-10 ENCOUNTER — Encounter: Payer: Self-pay | Admitting: Oncology

## 2024-08-10 ENCOUNTER — Other Ambulatory Visit: Payer: Self-pay

## 2024-08-10 ENCOUNTER — Other Ambulatory Visit (HOSPITAL_COMMUNITY): Payer: Self-pay

## 2024-08-10 MED ORDER — FERROUS SULFATE 325 (65 FE) MG PO TABS
325.0000 mg | ORAL_TABLET | ORAL | 0 refills | Status: AC
  Filled 2024-08-10: qty 4, 28d supply, fill #0

## 2024-08-10 NOTE — Progress Notes (Unsigned)
 Complex Care Management Note Care Guide Note  08/10/2024 Name: Kathleen [name] MRN: [id number] DOB: 12-Aug-1961   Complex Care Management Outreach Attempts: A second unsuccessful outreach was attempted today to offer the patient with information about available complex care management services.  Follow Up Plan:  Additional outreach attempts will be made to offer the patient complex care management information and services.   Encounter Outcome:  No Answer  Dreama [facility]wood [facility]  [facility], [facility] VBCI Assistant Direct Dial: [phone]  Fax: [phone]

## 2024-08-13 ENCOUNTER — Emergency Department (HOSPITAL_COMMUNITY)

## 2024-08-13 ENCOUNTER — Other Ambulatory Visit: Payer: Self-pay

## 2024-08-13 ENCOUNTER — Emergency Department (HOSPITAL_COMMUNITY)
Admission: EM | Admit: 2024-08-13 | Discharge: 2024-08-13 | Disposition: A | Discharge status: Home / Self Care | Source: Home / Self Care

## 2024-08-13 ENCOUNTER — Encounter (HOSPITAL_COMMUNITY): Payer: Self-pay

## 2024-08-13 DIAGNOSIS — M549 Dorsalgia, unspecified: Secondary | ICD-10-CM | POA: Insufficient documentation

## 2024-08-13 DIAGNOSIS — R0789 Other chest pain: Secondary | ICD-10-CM | POA: Insufficient documentation

## 2024-08-13 DIAGNOSIS — R0602 Shortness of breath: Secondary | ICD-10-CM | POA: Insufficient documentation

## 2024-08-13 DIAGNOSIS — G8929 Other chronic pain: Secondary | ICD-10-CM | POA: Insufficient documentation

## 2024-08-13 DIAGNOSIS — Z7901 Long term (current) use of anticoagulants: Secondary | ICD-10-CM | POA: Insufficient documentation

## 2024-08-13 DIAGNOSIS — K760 Fatty (change of) liver, not elsewhere classified: Secondary | ICD-10-CM | POA: Insufficient documentation

## 2024-08-13 LAB — CBC WITH DIFFERENTIAL/PLATELET
Abs Immature Granulocytes: 0.21 K/uL — ABNORMAL HIGH (ref 0.00–0.07)
Basophils Absolute: 0.1 K/uL (ref 0.0–0.1)
Basophils Relative: 1 %
Eosinophils Absolute: 0.6 K/uL — ABNORMAL HIGH (ref 0.0–0.5)
Eosinophils Relative: 5 %
HCT: 33.4 % — ABNORMAL LOW (ref 36.0–46.0)
Hemoglobin: 10 g/dL — ABNORMAL LOW (ref 12.0–15.0)
Immature Granulocytes: 2 %
Lymphocytes Relative: 10 %
Lymphs Abs: 1.1 K/uL (ref 0.7–4.0)
MCH: 27.1 pg (ref 26.0–34.0)
MCHC: 29.9 g/dL — ABNORMAL LOW (ref 30.0–36.0)
MCV: 90.5 fL (ref 80.0–100.0)
Monocytes Absolute: 1.1 K/uL — ABNORMAL HIGH (ref 0.1–1.0)
Monocytes Relative: 9 %
Neutro Abs: 8.6 K/uL — ABNORMAL HIGH (ref 1.7–7.7)
Neutrophils Relative %: 73 %
Platelets: 439 K/uL — ABNORMAL HIGH (ref 150–400)
RBC: 3.69 MIL/uL — ABNORMAL LOW (ref 3.87–5.11)
RDW: 17.9 % — ABNORMAL HIGH (ref 11.5–15.5)
WBC: 11.7 K/uL — ABNORMAL HIGH (ref 4.0–10.5)
nRBC: 0 % (ref 0.0–0.2)

## 2024-08-13 LAB — TROPONIN T, HIGH SENSITIVITY
Troponin T High Sensitivity: 15 ng/L (ref 0–19)
Troponin T High Sensitivity: <15 ng/L (ref 0–19)

## 2024-08-13 LAB — BASIC METABOLIC PANEL WITH GFR
Anion gap: 5 (ref 5–15)
BUN: 9 mg/dL (ref 8–23)
CO2: 40 mmol/L — ABNORMAL HIGH (ref 22–32)
Calcium: 9.9 mg/dL (ref 8.9–10.3)
Chloride: 95 mmol/L — ABNORMAL LOW (ref 98–111)
Creatinine, Ser: 0.87 mg/dL (ref 0.44–1.00)
GFR, Estimated: >60 mL/min (ref >60)
Glucose, Bld: 131 mg/dL — ABNORMAL HIGH (ref 70–99)
Potassium: 3.7 mmol/L (ref 3.5–5.1)
Sodium: 140 mmol/L (ref 135–145)

## 2024-08-13 MED ORDER — METHOCARBAMOL 500 MG PO TABS
1000.0000 mg | ORAL_TABLET | Freq: Four times a day (QID) | ORAL | 0 refills | Status: AC | PRN
  Filled 2024-08-13: qty 56, 7d supply, fill #0

## 2024-08-13 MED ORDER — HYDROMORPHONE HCL 1 MG/ML IJ SOLN
0.5000 mg | Freq: Once | INTRAMUSCULAR | Status: AC
  Administered 2024-08-13: 03:00:00 0.5 mg via INTRAVENOUS
  Filled 2024-08-13: qty 0.5

## 2024-08-13 MED ORDER — IOHEXOL 350 MG/ML SOLN
100.0000 mL | Freq: Once | INTRAVENOUS | Status: AC | PRN
  Administered 2024-08-13: 05:00:00 100 mL via INTRAVENOUS

## 2024-08-13 MED ORDER — DIPHENHYDRAMINE HCL 50 MG/ML IJ SOLN
25.0000 mg | Freq: Once | INTRAMUSCULAR | Status: AC
  Administered 2024-08-13: 03:00:00 25 mg via INTRAVENOUS
  Filled 2024-08-13: qty 1

## 2024-08-13 MED ORDER — HYDROMORPHONE HCL 1 MG/ML IJ SOLN
1.0000 mg | Freq: Once | INTRAMUSCULAR | Status: AC
  Administered 2024-08-13: 06:00:00 1 mg via INTRAVENOUS
  Filled 2024-08-13: qty 1

## 2024-08-13 MED ORDER — LIDOCAINE 5 % EX PTCH
1.0000 | MEDICATED_PATCH | CUTANEOUS | 0 refills | Status: DC
  Filled 2024-08-13: qty 30, 30d supply, fill #0

## 2024-08-13 MED ORDER — METHOCARBAMOL 500 MG PO TABS
1000.0000 mg | ORAL_TABLET | Freq: Once | ORAL | Status: AC
  Administered 2024-08-13: 06:00:00 1000 mg via ORAL
  Filled 2024-08-13: qty 2

## 2024-08-13 MED ORDER — ONDANSETRON HCL 4 MG/2ML IJ SOLN
4.0000 mg | Freq: Once | INTRAMUSCULAR | Status: AC
  Administered 2024-08-13: 03:00:00 4 mg via INTRAVENOUS
  Filled 2024-08-13: qty 2

## 2024-08-13 MED ORDER — HYDROCORTISONE SOD SUC (PF) 250 MG IJ SOLR
200.0000 mg | Freq: Once | INTRAMUSCULAR | Status: AC
  Administered 2024-08-13: 03:00:00 200 mg via INTRAVENOUS
  Filled 2024-08-13: qty 200

## 2024-08-13 MED ADMIN — Hydromorphone HCl Inj 1 MG/ML: 2 mg | INTRAVENOUS | @ 07:00:00 | NDC 76045000901

## 2024-08-13 MED ADMIN — Oxycodone HCl Tab 5 MG: 15 mg | ORAL | @ 07:00:00 | NDC 00406055223

## 2024-08-13 MED FILL — Hydromorphone HCl Inj 1 MG/ML: 2.0000 mg | INTRAMUSCULAR | Qty: 2 | Status: AC

## 2024-08-13 MED FILL — Oxycodone HCl Tab 5 MG: 15.0000 mg | ORAL | Qty: 3 | Status: AC

## 2024-08-13 NOTE — ED Triage Notes (Signed)
 Pt to ED from home with c/o chest pain that started 2 days ago, EMS reports increased pain with palpitation and movement. Pt took oxycodone  for chronic pain without relief. Pt on continuous oxygen  at home at 5L per pt.

## 2024-08-13 NOTE — Progress Notes (Signed)
 Complex Care Management Note  Care Guide Note 08/13/2024 Name: Kathleen [name] MRN: [id number] DOB: Jan 31, 1961  Kathleen [name] is a 63 y.o. year old female who sees Luking, Glendia LABOR, MD for primary care. I reached out to Kathleen [name] by phone today to offer complex care management services.  Kathleen [name] was given information about Complex Care Management services today including:   The Complex Care Management services include support from the care team which includes your Nurse Care Manager, Clinical Social Worker, or Pharmacist.  The Complex Care Management team is here to help remove barriers to the health concerns and goals most important to you. Complex Care Management services are voluntary, and the patient may decline or stop services at any time by request to their care team member.   Complex Care Management Consent Status: Patient agreed to services and verbal consent obtained.   Follow up plan:  Telephone appointment with complex care management team member scheduled for:  08/18/24 at 3:00 p.m.   Encounter Outcome:  Patient Scheduled  Dreama [facility]  [facility], [facility] VBCI Assistant Direct Dial: [phone]  Fax: [phone]

## 2024-08-13 NOTE — ED Provider Notes (Signed)
 [name] EMERGENCY DEPARTMENT AT [facility] Provider Note   CSN: [id number] Arrival date & time: 08/13/24  0125     Patient presents with: Chest Pain   Kathleen [name] is a 63 y.o. female.   63 year old female presents for evaluation of chest pain.  She states usually she had back pain but now the pain is radiating straight through to her chest.  She states this is different worse than her usual pain.  She admits to increased shortness of breath as well.  She is on 5 L nasal cannula at all times.  She denies any nausea or vomiting or any other symptoms or concerns at this time.   Chest Pain Associated symptoms: back pain   Associated symptoms: no abdominal pain, no cough, no fever, no palpitations, no shortness of breath and no vomiting        Prior to Admission medications  Medication Sig Start Date End Date Taking? Authorizing Provider  ferrous sulfate  (FEROSUL) 325 (65 FE) MG tablet Take 1 tablet (325 mg total) by mouth once a week. 08/10/24   Alphonsa Glendia LABOR, MD  lidocaine  (LIDODERM ) 5 % Place 1 patch onto the skin daily. Remove & Discard patch within 12 hours or as directed by MD 08/13/24  Yes Franceska Strahm L, DO  methocarbamol  (ROBAXIN ) 500 MG tablet Take 2 tablets (1,000 mg total) by mouth every 6 (six) hours as needed for up to 7 days for muscle spasms. 08/13/24 08/20/24 Yes Siegfried Vieth L, DO  acetaminophen  (TYLENOL ) 325 MG tablet Take 2 tablets (650 mg total) by mouth every 6 (six) hours as needed for mild pain (pain score 1-3) or fever (or Fever >/= 101). 02/19/24   Angiulli, Toribio PARAS, PA-C  alendronate  (FOSAMAX ) 70 MG tablet Take 1 tablet (70 mg total) by mouth every Saturday. Take with a full glass of water  on an empty stomach. 03/14/24   Alphonsa Glendia LABOR, MD  apixaban  (ELIQUIS ) 5 MG TABS tablet Take 1 tablet (5 mg total) by mouth 2 (two) times daily. 07/21/24   Alphonsa Glendia LABOR, MD  buprenorphine  (BUTRANS ) 15 MCG/HR Place onto the skin every 7 (seven) days  for 28 days. **Remove old patch, fold and discard away from children and pets.** 08/04/24 09/03/24    buprenorphine  (BUTRANS ) 7.5 MCG/HR Apply 1 patch onto skin weekly, change every 7 days 07/07/24     busPIRone  (BUSPAR ) 15 MG tablet Take 1 tablet (15 mg) by mouth 3 times daily. 03/09/24   Alphonsa Glendia LABOR, MD  calcitonin, salmon, (MIACALCIN MARCIANA) 200 UNIT/ACT nasal spray Place 1 spray into alternate nostrils daily. 07/22/24     Calcium  Citrate 333 MG TABS Take one tablet by mouth 3 (three) times daily with meals. 03/17/24   Nida, Gebreselassie W, MD  Capsaicin -Menthol -Methyl Sal (CAPSAICIN -METHYL SAL-MENTHOL ) 0.025-1-12 % CREA Apply 1 Application topically 3 (three) times daily as needed. 05/23/24   Patsy Lenis, MD  folic acid  (FOLVITE ) 1 MG tablet Take 1 tablet (1 mg total) by mouth daily. 05/04/24   Alphonsa Glendia LABOR, MD  gabapentin  (NEURONTIN ) 300 MG capsule Take 1 capsule (300 mg total) by mouth 4 (four) times daily. 07/30/24   Suzette Pac, MD  hydrocortisone  (CORTEF ) 10 MG tablet Take 2 tablets by mouth at 8:00 am and 1 tablet at lunch. (May double dose on sick days x 3-5 days) 06/17/24   Nida, Gebreselassie W, MD  hydrOXYzine  (ATARAX ) 10 MG tablet Take 1 tablet (10 mg total) by mouth 3 (three) times daily as  needed for anxiety 07/29/24   Hoskins, Carolyn C, NP  levothyroxine  (SYNTHROID ) 50 MCG tablet Take 1 tablet by mouth daily before breakfast. 03/09/24 09/05/24  Alphonsa Glendia LABOR, MD  linaclotide  (LINZESS ) 145 MCG CAPS capsule Take 1 capsule (145 mcg total) by mouth daily before breakfast. 08/06/24   Alphonsa Glendia LABOR, MD  midodrine  (PROAMATINE ) 5 MG tablet Take 1 tablet (5 mg total) by mouth 2 (two) times daily with a meal. 03/09/24   Luking, Glendia LABOR, MD  ondansetron  (ZOFRAN -ODT) 8 MG disintegrating tablet Dissolve 1 tablet (8 mg total) by mouth every 8 (eight) hours as needed. 08/05/24 08/05/25  Alphonsa Glendia LABOR, MD  oxyCODONE  (ROXICODONE ) 15 MG immediate release tablet Take 1 tablet (15 mg total) by  mouth every 4 (four) hours as needed for pain 05/07/24     oxyCODONE  (ROXICODONE ) 15 MG immediate release tablet Take 1 tablet (15 mg total) by mouth every 4 (four) hours. 06/06/24 06/05/24     oxyCODONE  (ROXICODONE ) 15 MG immediate release tablet Take 1 tablet (15 mg total) by mouth every 4 (four) hours as needed for pain. 08/04/24     OXYGEN  Inhale 5 L into the lungs continuous.    [provider]  pantoprazole  (PROTONIX ) 40 MG tablet Take 1 tablet (40 mg total) by mouth 2 (two) times daily. 03/09/24   Alphonsa Glendia LABOR, MD  polyethylene glycol (MIRALAX  / GLYCOLAX ) 17 g packet Take 17 g by mouth 2 (two) times daily. 01/23/24   Pearlean Manus, MD  potassium chloride  (KLOR-CON  M) 10 MEQ tablet Take 1 tablet (10 mEq total) by mouth 2 (two) times daily. 05/29/24   Alphonsa Glendia LABOR, MD  revefenacin  (YUPELRI ) 175 MCG/3ML nebulizer solution Take 3 mLs (175 mcg total) by nebulization daily. 02/24/24   Angiulli, Toribio PARAS, PA-C  rosuvastatin  (CRESTOR ) 20 MG tablet Take 1 tablet (20 mg total) by mouth daily. 03/09/24   Alphonsa Glendia LABOR, MD  Semaglutide ,0.25 or 0.5MG /DOS, (OZEMPIC , 0.25 OR 0.5 MG/DOSE,) 2 MG/3ML SOPN Inject 0.25 mg into the skin once a week. 07/09/24   Nida, Gebreselassie W, MD  senna-docusate (SENOKOT-S) 8.6-50 MG tablet Take 2 tablets by mouth at bedtime. 01/23/24   Pearlean Manus, MD  topiramate  (TOPAMAX ) 50 MG tablet Take 1 tablet (50 mg total) by mouth daily with lunch. 03/17/24   Lenis Ethelle ORN, MD  torsemide  (DEMADEX ) 20 MG tablet Take 2 tablets by mouth every morning, may take 1 additional torsemide  at 1 PM when weight gain is more than 2 pounds in 1 day 06/03/24   Luking, Glendia LABOR, MD  Vitamin D , Ergocalciferol , (DRISDOL ) 1.25 MG (50000 UNIT) CAPS capsule Take 1 capsule (50,000 Units total) by mouth every Sunday. 03/01/24   Angiulli, Toribio PARAS, PA-C    Allergies: Bee venom, Contrast media [iodinated contrast media], Bactrim  [sulfamethoxazole -trimethoprim ], and Codeine    Review of  Systems  Constitutional:  Negative for chills and fever.  HENT:  Negative for ear pain and sore throat.   Eyes:  Negative for pain and visual disturbance.  Respiratory:  Negative for cough and shortness of breath.   Cardiovascular:  Positive for chest pain. Negative for palpitations.  Gastrointestinal:  Negative for abdominal pain and vomiting.  Genitourinary:  Negative for dysuria and hematuria.  Musculoskeletal:  Positive for back pain. Negative for arthralgias.  Skin:  Negative for color change and rash.  Neurological:  Negative for seizures and syncope.  All other systems reviewed and are negative.   Updated Vital Signs BP (!) 118/49  Pulse 90   Temp 98.6 F (37 C) (Oral)   Resp 14   Ht 5' 10 (1.778 m)   Wt 108.9 kg   LMP 05/17/2016   SpO2 91%   BMI 34.44 kg/m   Physical Exam Vitals and nursing note reviewed.  Constitutional:      General: She is not in acute distress.    Appearance: She is well-developed. She is ill-appearing.  HENT:     Head: Normocephalic and atraumatic.  Eyes:     Conjunctiva/sclera: Conjunctivae normal.  Cardiovascular:     Rate and Rhythm: Normal rate and regular rhythm.     Heart sounds: No murmur heard. Pulmonary:     Effort: Pulmonary effort is normal. No respiratory distress.     Comments: Breath sounds diminished bilaterally Abdominal:     Palpations: Abdomen is soft.     Tenderness: There is abdominal tenderness.  Musculoskeletal:        General: No swelling.     Cervical back: Neck supple.  Skin:    General: Skin is warm and dry.     Capillary Refill: Capillary refill takes less than 2 seconds.  Neurological:     Mental Status: She is alert.  Psychiatric:        Mood and Affect: Mood normal.     (all labs ordered are listed, but only abnormal results are displayed) Labs Reviewed  BASIC METABOLIC PANEL WITH GFR - Abnormal; Notable for the following components:      Result Value   Chloride 95 (*)    CO2 40 (*)     Glucose, Bld 131 (*)    All other components within normal limits  CBC WITH DIFFERENTIAL/PLATELET - Abnormal; Notable for the following components:   WBC 11.7 (*)    RBC 3.69 (*)    Hemoglobin 10.0 (*)    HCT 33.4 (*)    MCHC 29.9 (*)    RDW 17.9 (*)    Platelets 439 (*)    Neutro Abs 8.6 (*)    Monocytes Absolute 1.1 (*)    Eosinophils Absolute 0.6 (*)    Abs Immature Granulocytes 0.21 (*)    All other components within normal limits  TROPONIN T, HIGH SENSITIVITY  TROPONIN T, HIGH SENSITIVITY    EKG: None  Radiology: CT Angio Chest/Abd/Pel for Dissection W and/or Wo Contrast Result Date: 08/13/2024 EXAM: CTA CHEST AORTA 08/13/2024 04:54:55 AM TECHNIQUE: CTA of the chest was performed without and with the administration of 100 mL of intravenous iohexol  (OMNIPAQUE ) 350 MG/ML injection. Multiplanar reformatted images are provided for review. MIP images are provided for review. Automated exposure control, iterative reconstruction, and/or weight based adjustment of the mA/kV was utilized to reduce the radiation dose to as low as reasonably achievable. COMPARISON: CT of the abdomen and pelvis dated 04/26/2024 and CT of the chest dated 01/21/2024. CLINICAL HISTORY: ro dissection ro dissection FINDINGS: AORTA: There is mild calcific atheromatous disease within the aorta, but no evidence of aneurysm or dissection. MEDIASTINUM: The heart and pericardium demonstrate no acute abnormality. LYMPH NODES: There is an enlarged right paratracheal lymph node again demonstrated, measuring approximately 2.2 x 1.6 cm, which has marginally enlarged in the interim. There are several other shotty mediastinal lymph nodes. No hilar or axillary lymphadenopathy. LUNGS AND PLEURA: There is what appears to be consolidation/scarring again demonstrated posterior medially within the upper lobes bilaterally, associated with traction bronchiectasis and irregular reticulation, similar to the prior study. There is also mild  ground glass and  mosaic attenuation of the lower lobes bilaterally. No pleural effusion or pneumothorax. UPPER ABDOMEN: There is fatty infiltration of the liver. The patient is status post cholecystectomy. There is a simple cyst within the right kidney. There is fatty atrophy of the pancreas. SOFT TISSUES AND BONES: No acute bone or soft tissue abnormality. IMPRESSION: 1. No evidence of aortic dissection or aneurysm. 2. Enlarged right paratracheal lymph node, marginally enlarged in the interim, measuring approximately 2.2 x 1.6 cm. Several other shotty mediastinal lymph nodes. 3. Posterior medial upper lobe consolidation/scarring with traction bronchiectasis and irregular reticulation, similar to prior study. 4. Mild ground glass and mosaic attenuation of the lower lobes bilaterally. Electronically signed by: Evalene Coho MD 08/13/2024 06:03 AM EST RP Workstation: [id number]   DG Chest 1 View Result Date: 08/13/2024 EXAM: 1 VIEW(S) XRAY OF THE CHEST 08/13/2024 02:13:00 AM COMPARISON: 07/30/2024 CLINICAL HISTORY: chest pain FINDINGS: LUNGS AND PLEURA: Stable chronic coarsened interstitial markings without pulmonary edema. Bibasilar pulmonary opacity related to exuberant mediastinal fat, but are seen on the CT examination of Jan 21, 2024. Biapical predominant parenchymal scarring may reflect changes of underlying sarcoidosis or pneumoconiosis. No pleural effusion. No pneumothorax. HEART AND MEDIASTINUM: No acute abnormality of the cardiac and mediastinal silhouettes. Exuberant mediastinal fat is noted, contributing to bibasilar pulmonary opacity as described in the LUNGS AND PLEURA section. This finding was also seen on the CT examination of Jan 21, 2024. BONES AND SOFT TISSUES: No acute osseous abnormality. IMPRESSION: 1. Stable chronic coarsened interstitial markings without pulmonary edema. 2. Biapical predominant parenchymal scarring, which may reflect underlying sarcoidosis or pneumoconiosis.  Electronically signed by: Dorethia Molt MD 08/13/2024 02:22 AM EST RP Workstation: [id number]     Procedures   Medications Ordered in the ED  HYDROmorphone  (DILAUDID ) injection 0.5 mg (0.5 mg Intravenous Given 08/13/24 0232)  ondansetron  (ZOFRAN ) injection 4 mg (4 mg Intravenous Given 08/13/24 0232)  hydrocortisone  sodium succinate  (SOLU-CORTEF ) injection 200 mg (200 mg Intravenous Given 08/13/24 0322)  diphenhydrAMINE  (BENADRYL ) injection 25 mg (25 mg Intravenous Given 08/13/24 0232)  HYDROmorphone  (DILAUDID ) injection 0.5 mg (0.5 mg Intravenous Given 08/13/24 0322)  iohexol  (OMNIPAQUE ) 350 MG/ML injection 100 mL (100 mLs Intravenous Contrast Given 08/13/24 0436)  HYDROmorphone  (DILAUDID ) injection 1 mg (1 mg Intravenous Given 08/13/24 0531)  methocarbamol  (ROBAXIN ) tablet 1,000 mg (1,000 mg Oral Given 08/13/24 0531)  HYDROmorphone  (DILAUDID ) injection 2 mg (2 mg Intravenous Given 08/13/24 0631)  oxyCODONE  (Oxy IR/ROXICODONE ) immediate release tablet 15 mg (15 mg Oral Given 08/13/24 0631)                                    Medical Decision Making Cardiac monitor interpretation: Sinus rhythm, no ectopy    Patient here for back pain and chest pain.  Lab workup unremarkable and vitals are stable.  SHe is on 5 L nasal cannula at baseline.  She was given IV Solu-Medrol  and Benadryl  for history of remote contrast allergy and she had a CTA done after this.  SHe has no evidence of any acute abnormality including no aortic dissection.  No acute bony changes either.  She received multiple doses of Dilaudid  in the ER with some improvement in her pain.  Will give her prescription for lidocaine  and Robaxin .  Will give her home dose of oxycodone  and another dose of Dilaudid  prior to discharge.  She has follow-up scheduled with her surgeon next week.  Advised her to call the office  if lidocaine  is her appointment or return to the ER for any worsening symptoms.  Feels comfortable with the plan to be  discharged home   Problems Addressed: Atypical chest pain: acute illness or injury Chronic bilateral back pain, unspecified back location: chronic illness or injury with exacerbation, progression, or side effects of treatment  Amount and/or Complexity of Data Reviewed External Data Reviewed: notes.    Details: Prior ED records reviewed with patient/father as well as 25 for back pain which was a spasm Labs: ordered. Decision-making details documented in ED Course.    Details: Ordered and reviewed by me and unremarkable Radiology: ordered and independent interpretation performed. Decision-making details documented in ED Course.    Details: Ordered and interpreted by me independently of radiology Chest x-ray (no acute abnormality CT angiogram of the chest shows no dissection or acute abnormality ECG/medicine tests: ordered and independent interpretation performed. Decision-making details documented in ED Course.    Details: Ordered and interpreted by me in the absence of cardiology and shows sinus rhythm, no STEMI, or significant change when compared to prior EKG  Risk OTC drugs. Prescription drug management. Parenteral controlled substances. Drug therapy requiring intensive monitoring for toxicity.     Final diagnoses:  Atypical chest pain  Chronic bilateral back pain, unspecified back location    ED Discharge Orders          Ordered    lidocaine  (LIDODERM ) 5 %  Every 24 hours        08/13/24 0627    methocarbamol  (ROBAXIN ) 500 MG tablet  Every 6 hours PRN        08/13/24 0627               Yulanda Diggs L, DO 08/13/24 [phone]

## 2024-08-13 NOTE — ED Notes (Signed)
 Pt back from CT. Pillow placed behind patients back and wet sponge stick given to patient per request.

## 2024-08-13 NOTE — Discharge Instructions (Signed)
 Follow-up with your surgeon.  Call the office in 2 days to arrange an appointment for later this week or early next week.  You can take Robaxin  up to 4 times a day as needed neurologic symptoms.  Management of other pain medication as prescribed.

## 2024-08-16 ENCOUNTER — Encounter (HOSPITAL_COMMUNITY): Payer: Self-pay | Admitting: Emergency Medicine

## 2024-08-16 ENCOUNTER — Other Ambulatory Visit: Payer: Self-pay

## 2024-08-16 ENCOUNTER — Emergency Department (HOSPITAL_COMMUNITY)

## 2024-08-16 ENCOUNTER — Inpatient Hospital Stay (HOSPITAL_COMMUNITY)
Admission: EM | Admit: 2024-08-16 | Discharge: 2024-08-26 | DRG: 194 | Disposition: A | Discharge status: Home Health | Attending: Internal Medicine | Admitting: Internal Medicine

## 2024-08-16 DIAGNOSIS — J180 Bronchopneumonia, unspecified organism: Secondary | ICD-10-CM | POA: Diagnosis present

## 2024-08-16 DIAGNOSIS — M797 Fibromyalgia: Secondary | ICD-10-CM | POA: Diagnosis present

## 2024-08-16 DIAGNOSIS — R309 Painful micturition, unspecified: Secondary | ICD-10-CM | POA: Diagnosis present

## 2024-08-16 DIAGNOSIS — Z91041 Radiographic dye allergy status: Secondary | ICD-10-CM | POA: Diagnosis not present

## 2024-08-16 DIAGNOSIS — E271 Primary adrenocortical insufficiency: Secondary | ICD-10-CM | POA: Diagnosis present

## 2024-08-16 DIAGNOSIS — S22060A Wedge compression fracture of T7-T8 vertebra, initial encounter for closed fracture: Secondary | ICD-10-CM | POA: Diagnosis not present

## 2024-08-16 DIAGNOSIS — Z91030 Bee allergy status: Secondary | ICD-10-CM

## 2024-08-16 DIAGNOSIS — I5032 Chronic diastolic (congestive) heart failure: Secondary | ICD-10-CM | POA: Diagnosis present

## 2024-08-16 DIAGNOSIS — I4891 Unspecified atrial fibrillation: Secondary | ICD-10-CM | POA: Diagnosis present

## 2024-08-16 DIAGNOSIS — F32A Depression, unspecified: Secondary | ICD-10-CM | POA: Diagnosis present

## 2024-08-16 DIAGNOSIS — M4854XA Collapsed vertebra, not elsewhere classified, thoracic region, initial encounter for fracture: Secondary | ICD-10-CM | POA: Diagnosis present

## 2024-08-16 DIAGNOSIS — Z6834 Body mass index (BMI) 34.0-34.9, adult: Secondary | ICD-10-CM

## 2024-08-16 DIAGNOSIS — Z79899 Other long term (current) drug therapy: Secondary | ICD-10-CM

## 2024-08-16 DIAGNOSIS — R079 Chest pain, unspecified: Secondary | ICD-10-CM | POA: Diagnosis not present

## 2024-08-16 DIAGNOSIS — Z7983 Long term (current) use of bisphosphonates: Secondary | ICD-10-CM

## 2024-08-16 DIAGNOSIS — Z7401 Bed confinement status: Secondary | ICD-10-CM

## 2024-08-16 DIAGNOSIS — M549 Dorsalgia, unspecified: Secondary | ICD-10-CM | POA: Diagnosis not present

## 2024-08-16 DIAGNOSIS — J9611 Chronic respiratory failure with hypoxia: Secondary | ICD-10-CM | POA: Diagnosis present

## 2024-08-16 DIAGNOSIS — Z7985 Long-term (current) use of injectable non-insulin antidiabetic drugs: Secondary | ICD-10-CM

## 2024-08-16 DIAGNOSIS — Z79890 Hormone replacement therapy (postmenopausal): Secondary | ICD-10-CM

## 2024-08-16 DIAGNOSIS — F321 Major depressive disorder, single episode, moderate: Secondary | ICD-10-CM | POA: Diagnosis present

## 2024-08-16 DIAGNOSIS — G8929 Other chronic pain: Secondary | ICD-10-CM | POA: Diagnosis not present

## 2024-08-16 DIAGNOSIS — E669 Obesity, unspecified: Secondary | ICD-10-CM | POA: Diagnosis present

## 2024-08-16 DIAGNOSIS — Z803 Family history of malignant neoplasm of breast: Secondary | ICD-10-CM

## 2024-08-16 DIAGNOSIS — F419 Anxiety disorder, unspecified: Secondary | ICD-10-CM | POA: Diagnosis present

## 2024-08-16 DIAGNOSIS — Z9981 Dependence on supplemental oxygen: Secondary | ICD-10-CM

## 2024-08-16 DIAGNOSIS — D869 Sarcoidosis, unspecified: Secondary | ICD-10-CM | POA: Diagnosis present

## 2024-08-16 DIAGNOSIS — Z1152 Encounter for screening for COVID-19: Secondary | ICD-10-CM

## 2024-08-16 DIAGNOSIS — Z7952 Long term (current) use of systemic steroids: Secondary | ICD-10-CM

## 2024-08-16 DIAGNOSIS — E039 Hypothyroidism, unspecified: Secondary | ICD-10-CM | POA: Diagnosis present

## 2024-08-16 DIAGNOSIS — Z8042 Family history of malignant neoplasm of prostate: Secondary | ICD-10-CM

## 2024-08-16 DIAGNOSIS — M4856XA Collapsed vertebra, not elsewhere classified, lumbar region, initial encounter for fracture: Secondary | ICD-10-CM | POA: Diagnosis present

## 2024-08-16 DIAGNOSIS — J849 Interstitial pulmonary disease, unspecified: Secondary | ICD-10-CM | POA: Diagnosis not present

## 2024-08-16 DIAGNOSIS — G894 Chronic pain syndrome: Secondary | ICD-10-CM | POA: Diagnosis present

## 2024-08-16 DIAGNOSIS — G4733 Obstructive sleep apnea (adult) (pediatric): Secondary | ICD-10-CM | POA: Diagnosis present

## 2024-08-16 DIAGNOSIS — J189 Pneumonia, unspecified organism: Secondary | ICD-10-CM | POA: Diagnosis not present

## 2024-08-16 DIAGNOSIS — Z8249 Family history of ischemic heart disease and other diseases of the circulatory system: Secondary | ICD-10-CM

## 2024-08-16 DIAGNOSIS — Z79891 Long term (current) use of opiate analgesic: Secondary | ICD-10-CM

## 2024-08-16 DIAGNOSIS — Z7901 Long term (current) use of anticoagulants: Secondary | ICD-10-CM

## 2024-08-16 DIAGNOSIS — R300 Dysuria: Secondary | ICD-10-CM | POA: Diagnosis present

## 2024-08-16 DIAGNOSIS — Z885 Allergy status to narcotic agent status: Secondary | ICD-10-CM

## 2024-08-16 DIAGNOSIS — M810 Age-related osteoporosis without current pathological fracture: Secondary | ICD-10-CM | POA: Diagnosis present

## 2024-08-16 DIAGNOSIS — D860 Sarcoidosis of lung: Secondary | ICD-10-CM | POA: Diagnosis not present

## 2024-08-16 DIAGNOSIS — R52 Pain, unspecified: Secondary | ICD-10-CM | POA: Diagnosis not present

## 2024-08-16 DIAGNOSIS — I959 Hypotension, unspecified: Secondary | ICD-10-CM | POA: Diagnosis present

## 2024-08-16 DIAGNOSIS — Z9049 Acquired absence of other specified parts of digestive tract: Secondary | ICD-10-CM

## 2024-08-16 LAB — COMPREHENSIVE METABOLIC PANEL WITH GFR
ALT: 12 U/L (ref 0–44)
AST: 19 U/L (ref 15–41)
Albumin: 3.8 g/dL (ref 3.5–5.0)
Alkaline Phosphatase: 93 U/L (ref 38–126)
Anion gap: 14 (ref 5–15)
BUN: 9 mg/dL (ref 8–23)
CO2: 31 mmol/L (ref 22–32)
Calcium: 9.8 mg/dL (ref 8.9–10.3)
Chloride: 94 mmol/L — ABNORMAL LOW (ref 98–111)
Creatinine, Ser: 0.89 mg/dL (ref 0.44–1.00)
GFR, Estimated: >60 mL/min
Glucose, Bld: 111 mg/dL — ABNORMAL HIGH (ref 70–99)
Potassium: 3.5 mmol/L (ref 3.5–5.1)
Sodium: 139 mmol/L (ref 135–145)
Total Bilirubin: 0.3 mg/dL (ref 0.0–1.2)
Total Protein: 6.9 g/dL (ref 6.5–8.1)

## 2024-08-16 LAB — RESP PANEL BY RT-PCR (RSV, FLU A&B, COVID)  RVPGX2
Influenza A by PCR: NEGATIVE
Influenza B by PCR: NEGATIVE
Resp Syncytial Virus by PCR: NEGATIVE
SARS Coronavirus 2 by RT PCR: NEGATIVE

## 2024-08-16 LAB — CBC
HCT: 30.3 % — ABNORMAL LOW (ref 36.0–46.0)
Hemoglobin: 9.3 g/dL — ABNORMAL LOW (ref 12.0–15.0)
MCH: 27.2 pg (ref 26.0–34.0)
MCHC: 30.7 g/dL (ref 30.0–36.0)
MCV: 88.6 fL (ref 80.0–100.0)
Platelets: 423 K/uL — ABNORMAL HIGH (ref 150–400)
RBC: 3.42 MIL/uL — ABNORMAL LOW (ref 3.87–5.11)
RDW: 17.9 % — ABNORMAL HIGH (ref 11.5–15.5)
WBC: 10.7 K/uL — ABNORMAL HIGH (ref 4.0–10.5)
nRBC: 0 % (ref 0.0–0.2)

## 2024-08-16 LAB — MAGNESIUM: Magnesium: 2.4 mg/dL (ref 1.7–2.4)

## 2024-08-16 LAB — PRO BRAIN NATRIURETIC PEPTIDE: Pro Brain Natriuretic Peptide: 87.8 pg/mL

## 2024-08-16 LAB — TROPONIN T, HIGH SENSITIVITY: Troponin T High Sensitivity: 17 ng/L (ref 0–19)

## 2024-08-16 LAB — LACTIC ACID, PLASMA: Lactic Acid, Venous: 1.3 mmol/L (ref 0.5–1.9)

## 2024-08-16 MED ORDER — HYDROMORPHONE HCL 1 MG/ML IJ SOLN: 1.0000 mg | Freq: Once | INTRAMUSCULAR | Status: DC

## 2024-08-16 MED ORDER — APIXABAN 5 MG PO TABS
5.0000 mg | ORAL_TABLET | Freq: Two times a day (BID) | ORAL | Status: DC
  Administered 2024-08-16 – 2024-08-26 (×20): 5 mg via ORAL
  Filled 2024-08-16 (×15): qty 1

## 2024-08-16 MED ORDER — PROMETHAZINE HCL 12.5 MG PO TABS
12.5000 mg | ORAL_TABLET | Freq: Four times a day (QID) | ORAL | Status: DC | PRN
  Administered 2024-08-17: 12.5 mg via ORAL
  Filled 2024-08-16: qty 1

## 2024-08-16 MED ORDER — TORSEMIDE 20 MG PO TABS
40.0000 mg | ORAL_TABLET | Freq: Every day | ORAL | Status: DC
  Administered 2024-08-17 – 2024-08-26 (×10): 40 mg via ORAL
  Filled 2024-08-16 (×7): qty 2

## 2024-08-16 MED ORDER — ONDANSETRON HCL 4 MG/2ML IJ SOLN
4.0000 mg | Freq: Once | INTRAMUSCULAR | Status: AC
  Administered 2024-08-16: 4 mg via INTRAVENOUS
  Filled 2024-08-16: qty 2

## 2024-08-16 MED ORDER — POLYETHYLENE GLYCOL 3350 17 G PO PACK: 17.0000 g | PACK | Freq: Every day | ORAL | Status: DC | PRN

## 2024-08-16 MED ORDER — REVEFENACIN 175 MCG/3ML IN SOLN
175.0000 ug | Freq: Every day | RESPIRATORY_TRACT | Status: DC
  Administered 2024-08-17 – 2024-08-26 (×9): 175 ug via RESPIRATORY_TRACT
  Filled 2024-08-16 (×7): qty 3

## 2024-08-16 MED ORDER — TOPIRAMATE 25 MG PO TABS
50.0000 mg | ORAL_TABLET | Freq: Every day | ORAL | Status: DC
  Administered 2024-08-17 – 2024-08-26 (×10): 50 mg via ORAL
  Filled 2024-08-16 (×7): qty 2

## 2024-08-16 MED ORDER — ACETAMINOPHEN 650 MG RE SUPP: 650.0000 mg | Freq: Four times a day (QID) | RECTAL | Status: DC | PRN

## 2024-08-16 MED ORDER — BUSPIRONE HCL 5 MG PO TABS
15.0000 mg | ORAL_TABLET | Freq: Three times a day (TID) | ORAL | Status: DC
  Administered 2024-08-16 – 2024-08-26 (×29): 15 mg via ORAL
  Filled 2024-08-16 (×6): qty 3
  Filled 2024-08-16: qty 1
  Filled 2024-08-16 (×2): qty 3
  Filled 2024-08-16: qty 1
  Filled 2024-08-16 (×11): qty 3
  Filled 2024-08-16: qty 1

## 2024-08-16 MED ORDER — CALCITONIN (SALMON) 200 UNIT/ACT NA SOLN
1.0000 | Freq: Every day | NASAL | Status: DC
  Administered 2024-08-17 – 2024-08-26 (×9): 1 via NASAL
  Filled 2024-08-16 (×2): qty 3.7

## 2024-08-16 MED ORDER — HYDROCORTISONE SOD SUC (PF) 100 MG IJ SOLR
40.0000 mg | Freq: Three times a day (TID) | INTRAMUSCULAR | Status: DC
  Administered 2024-08-16 – 2024-08-18 (×6): 40 mg via INTRAVENOUS
  Filled 2024-08-16: qty 2
  Filled 2024-08-16 (×4): qty 0.8
  Filled 2024-08-16: qty 2
  Filled 2024-08-16: qty 0.8
  Filled 2024-08-16: qty 2

## 2024-08-16 MED ORDER — OXYCODONE HCL 5 MG PO TABS
15.0000 mg | ORAL_TABLET | ORAL | Status: DC | PRN
  Administered 2024-08-16 – 2024-08-21 (×24): 15 mg via ORAL
  Filled 2024-08-16 (×24): qty 3

## 2024-08-16 MED ORDER — SODIUM CHLORIDE 0.9 % IV SOLN
2.0000 g | Freq: Once | INTRAVENOUS | Status: AC
  Administered 2024-08-16: 2 g via INTRAVENOUS
  Filled 2024-08-16: qty 20

## 2024-08-16 MED ORDER — HYDROMORPHONE HCL 1 MG/ML IJ SOLN
0.5000 mg | Freq: Once | INTRAMUSCULAR | Status: AC
  Administered 2024-08-16: 0.5 mg via INTRAVENOUS
  Filled 2024-08-16: qty 0.5

## 2024-08-16 MED ORDER — POTASSIUM CHLORIDE 20 MEQ PO PACK
40.0000 meq | PACK | Freq: Once | ORAL | Status: AC
  Administered 2024-08-16: 40 meq via ORAL
  Filled 2024-08-16: qty 2

## 2024-08-16 MED ORDER — ACETAMINOPHEN 325 MG PO TABS
650.0000 mg | ORAL_TABLET | Freq: Four times a day (QID) | ORAL | Status: DC | PRN
  Administered 2024-08-17 – 2024-08-25 (×8): 650 mg via ORAL
  Filled 2024-08-16 (×6): qty 2

## 2024-08-16 MED ORDER — SODIUM CHLORIDE 0.9 % IV SOLN
100.0000 mg | Freq: Two times a day (BID) | INTRAVENOUS | Status: DC
  Administered 2024-08-17 – 2024-08-18 (×2): 100 mg via INTRAVENOUS
  Filled 2024-08-16 (×5): qty 100

## 2024-08-16 MED ORDER — HYDROMORPHONE HCL 1 MG/ML IJ SOLN
1.0000 mg | Freq: Once | INTRAMUSCULAR | Status: AC
  Administered 2024-08-16: 1 mg via INTRAVENOUS
  Filled 2024-08-16: qty 1

## 2024-08-16 MED ORDER — SODIUM CHLORIDE 0.9 % IV SOLN: 500.0000 mg | INTRAVENOUS | Status: DC

## 2024-08-16 MED ORDER — SODIUM CHLORIDE 0.9 % IV SOLN
500.0000 mg | Freq: Once | INTRAVENOUS | Status: AC
  Administered 2024-08-16: 500 mg via INTRAVENOUS
  Filled 2024-08-16: qty 5

## 2024-08-16 MED ORDER — SODIUM CHLORIDE 0.9 % IV SOLN
2.0000 g | INTRAVENOUS | Status: AC
  Administered 2024-08-17 – 2024-08-21 (×5): 2 g via INTRAVENOUS
  Filled 2024-08-16 (×5): qty 20

## 2024-08-16 MED ORDER — LEVOTHYROXINE SODIUM 50 MCG PO TABS
50.0000 ug | ORAL_TABLET | Freq: Every day | ORAL | Status: DC
  Administered 2024-08-17 – 2024-08-26 (×10): 50 ug via ORAL
  Filled 2024-08-16 (×7): qty 1

## 2024-08-16 MED ORDER — MIDODRINE HCL 5 MG PO TABS
5.0000 mg | ORAL_TABLET | Freq: Two times a day (BID) | ORAL | Status: DC
  Administered 2024-08-17 – 2024-08-18 (×4): 5 mg via ORAL
  Filled 2024-08-16 (×4): qty 1

## 2024-08-16 MED ORDER — GUAIFENESIN-DM 100-10 MG/5ML PO SYRP
15.0000 mL | ORAL_SOLUTION | Freq: Three times a day (TID) | ORAL | Status: AC
  Filled 2024-08-16 (×2): qty 15

## 2024-08-16 NOTE — H&P (Addendum)
 " History and Physical    Kathleen [name] FMW:[id number] DOB: 05-25-1961 DOA: 08/16/2024  PCP: Alphonsa Glendia LABOR, MD   Patient coming from: Home  I have personally briefly reviewed patient's old medical records in [facility]  Chief Complaint: Difficulty breathing, Cough  HPI: Kathleen [name] is a 63 y.o. female with medical history significant for chronic respiratory failure on 5 L, Addison's disease, CHF, hypothyroidism, ILD and OSA. Patient presented to the ED with complaints of congestion, chest pain, cough and difficulty breathing over the past several days.  She was in the ED with similar complaints 12/18, CTA chest was negative for PE, and troponins EKG were unremarkable. Chest pain is underneath her left breast, and worse with deep breathing and coughing.  She reports cough productive of greenish sputum.  No fevers.  She reports dry heaving, no diarrhea, no abdominal pain.  She reports of pain with urination over the past few days.   She has been compliant with Eliquis .  ED Course: Temperature 98.2.  Heart rate 80s.  Respiratory rate 16-24.  Blood pressure systolic down to 97 improved to 140s.  O2 sats greater than 92% on 5 L. Chest x-ray shows left bronchopneumonia. Lactic acid 1.3. WBC 10.7. proBNP eight 7.8. Troponin 17. IV ceftriaxone  and azithromycin  started.  Hospitalist to admit for pneumonia.  Review of Systems: As per HPI all other systems reviewed and negative.  Past Medical History:  Diagnosis Date   Adrenal insufficiency    Anemia    Anxiety    Avascular bone necrosis (HCC)    Breast fibrocystic disorder    Chronic pain    Encounter for long-term opiate analgesic use 02/02/2020   Patient has chronic pain with femoral head avascular necrosis.  Is under pain contract   Fibromyalgia    Gastroesophageal reflux disease    Hypothyroidism    Mitral valve prolapse    Orthostatic hypotension    Peripheral neuropathy    PONV (postoperative nausea and vomiting)     Restrictive lung disease    Moderate to severe   Sarcoidosis    Biopsy proven - [facility]   Sarcoidosis    SOB (shortness of breath)    chronic    Past Surgical History:  Procedure Laterality Date   BIOPSY  10/17/2018   Procedure: BIOPSY;  Surgeon: Golda Claudis PENNER, MD;  [facility];  Service: Endoscopy;;  duodenum, antrum, gastric body   BIOPSY  03/03/2021   Procedure: BIOPSY;  Surgeon: Golda Claudis PENNER, MD;  [facility]: [facility];  Service: Endoscopy;;   BIOPSY  09/11/2023   Procedure: BIOPSY;  Surgeon: Shaaron Lamar HERO, MD;  [facility];  Service: Endoscopy;;   BREAST LUMPECTOMY Bilateral 01/12/2011   CHOLECYSTECTOMY N/A 04/17/2021   Procedure: LAPAROSCOPIC CHOLECYSTECTOMY;  Surgeon: Mavis Anes, MD;  Location: [facility];  Service: General;  Laterality: N/A;   COLONOSCOPY WITH PROPOFOL  N/A 01/03/2022   Procedure: COLONOSCOPY WITH PROPOFOL ;  Surgeon: Golda Claudis PENNER, MD;  [facility];  Service: Endoscopy;  Laterality: N/A;  220   ESOPHAGEAL DILATION N/A 03/03/2021   Procedure: ESOPHAGEAL DILATION;  Surgeon: Golda Claudis PENNER, MD;  [facility];  Service: Endoscopy;  Laterality: N/A;   ESOPHAGOGASTRODUODENOSCOPY (EGD) WITH PROPOFOL  N/A 10/17/2018   Procedure: ESOPHAGOGASTRODUODENOSCOPY (EGD) WITH PROPOFOL ;  Surgeon: Golda Claudis PENNER, MD;  [facility];  Service: Endoscopy;  Laterality: N/A;  2:25   ESOPHAGOGASTRODUODENOSCOPY (EGD) WITH PROPOFOL  N/A 03/03/2021   Procedure: ESOPHAGOGASTRODUODENOSCOPY (  EGD) WITH PROPOFOL ;  Surgeon: Golda Claudis PENNER, MD;  [facility];  Service: Endoscopy;  Laterality: N/A;  12:15   ESOPHAGOGASTRODUODENOSCOPY (EGD) WITH PROPOFOL  N/A 09/11/2023   Procedure: ESOPHAGOGASTRODUODENOSCOPY (EGD) WITH PROPOFOL ;  Surgeon: Shaaron Lamar HERO, MD;  [facility];  Service: Endoscopy;  Laterality: N/A;  2:30 pm, asa 3, LM to see if pt can come earlier   [facility] DILATION N/A 09/11/2023   Procedure:  AGAPITO DILATION;  Surgeon: Shaaron Lamar HERO, MD;  [facility];  Service: Endoscopy;  Laterality: N/A;   ORIF ANKLE FRACTURE Left 03/10/2023   Procedure: OPEN REDUCTION INTERNAL FIXATION (ORIF) ANKLE FRACTURE;  Surgeon: Georgina Ozell LABOR, MD;  Location: [facility];  Service: Orthopedics;  Laterality: Left;   POLYPECTOMY  01/03/2022   Procedure: POLYPECTOMY INTESTINAL;  Surgeon: Golda Claudis PENNER, MD;  [facility];  Service: Endoscopy;;   RIGHT HEART CATH N/A 05/15/2024   Procedure: RIGHT HEART CATH;  Surgeon: Zenaida Morene PARAS, MD;  Location: [facility] INVASIVE CV LAB;  Service: Cardiovascular;  Laterality: N/A;   RIGHT HEART CATH N/A 05/18/2024   Procedure: RIGHT HEART CATH;  Surgeon: Rolan Ezra RAMAN, MD;  Location: [facility] INVASIVE CV LAB;  Service: Cardiovascular;  Laterality: N/A;   TUBAL LIGATION       reports that she has never smoked. She has never been exposed to tobacco smoke. She has never used smokeless tobacco. She reports that she does not drink alcohol and does not use drugs.  Allergies[1]  Family History  Problem Relation Age of Onset   Cardiomyopathy Mother    Breast cancer Mother    Prostate cancer Father    Heart attack Maternal Grandmother    Heart attack Maternal Grandfather    Heart attack Paternal Grandmother    Bipolar disorder Daughter    Heart disease Maternal Aunt    Neurofibromatosis Cousin    Colon cancer Neg Hx     Prior to Admission medications  Medication Sig Start Date End Date Taking? Authorizing Provider  acetaminophen  (TYLENOL ) 325 MG tablet Take 2 tablets (650 mg total) by mouth every 6 (six) hours as needed for mild pain (pain score 1-3) or fever (or Fever >/= 101). 02/19/24  Yes Angiulli, Toribio PARAS, PA-C  alendronate  (FOSAMAX ) 70 MG tablet Take 1 tablet (70 mg total) by mouth every Saturday. Take with a full glass of water  on an empty stomach. 03/14/24  Yes Luking, Glendia LABOR, MD  apixaban  (ELIQUIS ) 5 MG TABS tablet Take 1 tablet (5 mg total) by  mouth 2 (two) times daily. 07/21/24  Yes Alphonsa Glendia LABOR, MD  buprenorphine  (BUTRANS ) 15 MCG/HR Place onto the skin every 7 (seven) days for 28 days. **Remove old patch, fold and discard away from children and pets.** 08/04/24 09/03/24 Yes   busPIRone  (BUSPAR ) 15 MG tablet Take 1 tablet (15 mg) by mouth 3 times daily. 03/09/24  Yes Alphonsa Glendia LABOR, MD  calcitonin, salmon, (MIACALCIN /FORTICAL) 200 UNIT/ACT nasal spray Place 1 spray into alternate nostrils daily. 07/22/24  Yes   Calcium  Citrate 333 MG TABS Take one tablet by mouth 3 (three) times daily with meals. 03/17/24  Yes Nida, Ethelle ORN, MD  ferrous sulfate  (FEROSUL) 325 (65 FE) MG tablet Take 1 tablet (325 mg total) by mouth once a week. 08/10/24  Yes Alphonsa Glendia LABOR, MD  folic acid  (FOLVITE ) 1 MG tablet Take 1 tablet (1 mg total) by mouth daily. 05/04/24  Yes Alphonsa Glendia LABOR, MD  gabapentin  (NEURONTIN ) 300  MG capsule Take 1 capsule (300 mg total) by mouth 4 (four) times daily. 07/30/24  Yes Suzette Pac, MD  hydrocortisone  (CORTEF ) 10 MG tablet Take 2 tablets by mouth at 8:00 am and 1 tablet at lunch. (May double dose on sick days x 3-5 days) 06/17/24  Yes Nida, Ethelle ORN, MD  hydrOXYzine  (ATARAX ) 10 MG tablet Take 1 tablet (10 mg total) by mouth 3 (three) times daily as needed for anxiety 07/29/24  Yes Hoskins, Carolyn C, NP  levothyroxine  (SYNTHROID ) 50 MCG tablet Take 1 tablet by mouth daily before breakfast. 03/09/24 09/05/24 Yes Luking, Glendia LABOR, MD  lidocaine  (LIDODERM ) 5 % Place 1 patch onto the skin daily. Remove & Discard patch within 12 hours or as directed by MD 08/13/24  Yes Kammerer, Megan L, DO  linaclotide  (LINZESS ) 145 MCG CAPS capsule Take 1 capsule (145 mcg total) by mouth daily before breakfast. 08/06/24  Yes Luking, Glendia LABOR, MD  methocarbamol  (ROBAXIN ) 500 MG tablet Take 2 tablets (1,000 mg total) by mouth every 6 (six) hours as needed for up to 7 days for muscle spasms. 08/13/24 08/22/24 Yes Kammerer, Megan L, DO   midodrine  (PROAMATINE ) 5 MG tablet Take 1 tablet (5 mg total) by mouth 2 (two) times daily with a meal. 03/09/24  Yes Luking, Glendia LABOR, MD  ondansetron  (ZOFRAN -ODT) 8 MG disintegrating tablet Dissolve 1 tablet (8 mg total) by mouth every 8 (eight) hours as needed. 08/05/24 08/05/25 Yes Alphonsa Glendia LABOR, MD  oxyCODONE  (ROXICODONE ) 15 MG immediate release tablet Take 1 tablet (15 mg total) by mouth every 4 (four) hours as needed for pain. Patient taking differently: Take 15 mg by mouth every 4 (four) hours. 08/04/24  Yes   OXYGEN  Inhale 5 L into the lungs continuous.   Yes [provider]  pantoprazole  (PROTONIX ) 40 MG tablet Take 1 tablet (40 mg total) by mouth 2 (two) times daily. 03/09/24  Yes Alphonsa Glendia LABOR, MD  polyethylene glycol (MIRALAX  / GLYCOLAX ) 17 g packet Take 17 g by mouth 2 (two) times daily. 01/23/24  Yes Codie Hainer, Courage, MD  potassium chloride  (KLOR-CON  M) 10 MEQ tablet Take 1 tablet (10 mEq total) by mouth 2 (two) times daily. 05/29/24  Yes Alphonsa Glendia LABOR, MD  revefenacin  (YUPELRI ) 175 MCG/3ML nebulizer solution Take 3 mLs (175 mcg total) by nebulization daily. 02/24/24  Yes Angiulli, Toribio PARAS, PA-C  rosuvastatin  (CRESTOR ) 20 MG tablet Take 1 tablet (20 mg total) by mouth daily. 03/09/24  Yes Luking, Scott A, MD  senna-docusate (SENOKOT-S) 8.6-50 MG tablet Take 2 tablets by mouth at bedtime. 01/23/24  Yes Pearlean Manus, MD  topiramate  (TOPAMAX ) 50 MG tablet Take 1 tablet (50 mg total) by mouth daily with lunch. 03/17/24  Yes Nida, Ethelle ORN, MD  torsemide  (DEMADEX ) 20 MG tablet Take 2 tablets by mouth every morning, may take 1 additional torsemide  at 1 PM when weight gain is more than 2 pounds in 1 day 06/03/24  Yes Luking, Glendia LABOR, MD  Vitamin D , Ergocalciferol , (DRISDOL ) 1.25 MG (50000 UNIT) CAPS capsule Take 1 capsule (50,000 Units total) by mouth every Sunday. 03/01/24  Yes Angiulli, Daniel J, PA-C  buprenorphine  (BUTRANS ) 7.5 MCG/HR Apply 1 patch onto skin weekly, change  every 7 days Patient not taking: Reported on 08/16/2024 07/07/24     Capsaicin -Menthol -Methyl Sal (CAPSAICIN -METHYL SAL-MENTHOL ) 0.025-1-12 % CREA Apply 1 Application topically 3 (three) times daily as needed. Patient not taking: Reported on 08/16/2024 05/23/24   Patsy Lenis, MD  oxyCODONE  (ROXICODONE ) 15 MG  immediate release tablet Take 1 tablet (15 mg total) by mouth every 4 (four) hours as needed for pain Patient not taking: Reported on 08/16/2024 05/07/24     oxyCODONE  (ROXICODONE ) 15 MG immediate release tablet Take 1 tablet (15 mg total) by mouth every 4 (four) hours. 06/06/24 Patient not taking: Reported on 08/16/2024 06/05/24     Semaglutide ,0.25 or 0.5MG /DOS, (OZEMPIC , 0.25 OR 0.5 MG/DOSE,) 2 MG/3ML SOPN Inject 0.25 mg into the skin once a week. Patient not taking: Reported on 08/16/2024 07/09/24   Lenis Ethelle ORN, MD    Physical Exam: Vitals:   08/16/24 1730 08/16/24 1800 08/16/24 1820 08/16/24 1830  BP: (!) 124/59 (!) 103/40 (!) 101/43 (!) 147/62  Pulse: 90 87 88 88  Resp: 16 20 (!) 24 (!) 22  Temp:      TempSrc:      SpO2: 98% 100% 99% 98%  Weight:      Height:        Constitutional: NAD, calm, comfortable Vitals:   08/16/24 1730 08/16/24 1800 08/16/24 1820 08/16/24 1830  BP: (!) 124/59 (!) 103/40 (!) 101/43 (!) 147/62  Pulse: 90 87 88 88  Resp: 16 20 (!) 24 (!) 22  Temp:      TempSrc:      SpO2: 98% 100% 99% 98%  Weight:      Height:       Eyes: PERRL, lids and conjunctivae normal ENMT: Mucous membranes are moist.  Neck: normal, supple, no masses, no thyromegaly Respiratory: clear to auscultation bilaterally, no wheezing, no crackles. Normal respiratory effort. No accessory muscle use.  Cardiovascular: Regular rate and rhythm, no murmurs / rubs / gallops. No extremity edema.  Extremities warm.  Abdomen: Obese, no tenderness, no masses palpated. No hepatosplenomegaly.  Musculoskeletal: no clubbing / cyanosis. No joint deformity upper and lower extremities.   Skin: no rashes, lesions, ulcers. No induration Neurologic: No facial asymmetry, Mae spontaneously, speech fluent. Psychiatric: Normal judgment and insight. Alert and oriented x 3. Normal mood.   Labs on Admission: I have personally reviewed following labs and imaging studies  CBC: Recent Labs  Lab 08/13/24 0238 08/16/24 1642  WBC 11.7* 10.7*  NEUTROABS 8.6*  --   HGB 10.0* 9.3*  HCT 33.4* 30.3*  MCV 90.5 88.6  PLT 439* 423*   Basic Metabolic Panel: Recent Labs  Lab 08/13/24 0238 08/16/24 1642  NA 140 139  K 3.7 3.5  CL 95* 94*  CO2 40* 31  GLUCOSE 131* 111*  BUN 9 9  CREATININE 0.87 0.89  CALCIUM  9.9 9.8   GFR: Estimated Creatinine Clearance: 88.8 mL/min (by C-G formula based on SCr of 0.89 mg/dL). Liver Function Tests: Recent Labs  Lab 08/16/24 1642  AST 19  ALT 12  ALKPHOS 93  BILITOT 0.3  PROT 6.9  ALBUMIN 3.8   BNP (last 3 results) Recent Labs    05/30/24 2251 08/16/24 1642  PROBNP 192.0 87.8    Radiological Exams on Admission: DG Chest 2 View Result Date: 08/16/2024 CLINICAL DATA:  Cough and shortness of breath. EXAM: CHEST - 2 VIEW COMPARISON:  Radiograph and CT 3 days ago 08/13/2024 FINDINGS: Stable heart size and mediastinal contours. Prominent epicardial fat pad is well as epicardial fat on recent CT. Increased peribronchial thickening and interstitial opacities in the left lung. Mild scarring in the right upper lobe. No pneumothorax or large pleural effusion. Limited assessment of the osseous structures. IMPRESSION: Increased peribronchial thickening and interstitial opacities in the left lung, suspicious for bronchopneumonia. Electronically  Signed   By: Andrea Gasman M.D.   On: 08/16/2024 17:07    EKG: Independently reviewed.  Sinus rhythm, rate 85, QTc 523.  No significant change from prior.  Assessment/Plan Principal Problem:   PNA (pneumonia) Active Problems:   Interstitial lung disease (HCC)   Chronic respiratory failure with  hypoxia (HCC)   Anxiety and depression   Sarcoidosis   Fibromyalgia   Obstructive sleep apnea   Chronic pain syndrome   Hypothyroidism   Adrenal insufficiency (Addison's disease) (HCC)   Depression, major, single episode, moderate (HCC)  Assessment and Plan:  Pneumonia-productive cough, dyspnea, increased O2 requirement.  Chest x-ray today showing left bronchopneumonia.  Ruled out for sepsis.  Afebrile.  WBC 10.7.  Normal lactic acid 1.3. - Continue with IV ceftriaxone  and doxycycline  (QTc prolonged) - Mucolytics - Reports dysuria- check UA  Addison's disease-blood pressure transiently down to 97/40 overnight, now improved.  She has been compliant with Solu-Cortef  and midodrine  - Will start stress dose steroids IV Solu-Cortef  40 - 3 times daily - Resume midodrine , nasal calcitonin  Chronic diastolic CHF-stable and compensated.  Last echo 03/2024-EF of 60 to 65% grade 1 DD. - Resume torsemide  40 mg daily in a.m.  Prolonged QT-533.  Potassium 3.5.  Prolonged QTc not new.  On BuSpar . -Check magnesium  and replete K  Chronic respiratory failure, ILD, sarcoidosis, OSA- on 5 L, sats currently greater than 92%.  Chronic pain, fibromyalgia -Resume home narcotics  Depression - Resume BuSpar   Hypothyroidism - Resume Synthroid    DVT prophylaxis: Eliquis  Code Status: Full code Family Communication: None at bedside Disposition Plan:  ~ 2 days Consults called: none Admission status: Inpatient, telemetry I certify that at the point of admission it is my clinical judgment that the patient will require inpatient hospital care spanning beyond 2 midnights from the point of admission due to high intensity of service, high risk for further deterioration and high frequency of surveillance required.   Author: Tully FORBES Carwin, MD 08/16/2024 9:00 PM  For on call review [email or web address].      [1]  Allergies Allergen Reactions   Bee Venom Anaphylaxis   Contrast Media [Iodinated  Contrast Media] Other (See Comments)    CT chest w/ contrast at OSH followed by SOB resolved w/ single dose steroids, never ENT swelling, intubation or pressors, has had multiple contrasted scans since   Bactrim  [Sulfamethoxazole -Trimethoprim ] Nausea And Vomiting   Codeine Itching   "

## 2024-08-16 NOTE — ED Notes (Signed)
 Bp cuff now on left lower leg by EDP

## 2024-08-16 NOTE — ED Notes (Signed)
 Holding pain med due to bp at this time. Edp aware.

## 2024-08-16 NOTE — ED Notes (Signed)
 See triage notes. A/o. No resp distress noted.

## 2024-08-16 NOTE — ED Triage Notes (Signed)
 Per EMS pt c/o congestion and chest pain for past few days. On 5L [place] at baseline. Patient anxious on arrival and states pain is worse when taking deep breath. States she was here a few days ago for same complaint however pain is worse.

## 2024-08-16 NOTE — ED Notes (Signed)
 Pt returned from xray. Pt restful at this time.

## 2024-08-16 NOTE — ED Provider Notes (Signed)
 " [place] EMERGENCY DEPARTMENT AT [facility] Provider Note   CSN: [id number] Arrival date & time: 08/16/24  1615     Patient presents with: Chest Pain   Kathleen [name] is a 63 y.o. female.   63 year old female history of interstitial lung disease on 5 L nasal cannula, pulmonary sarcoidosis, previously on steroids with multiple vertebral compression fractures and chronic pain, CHF, atrial fibrillation on Eliquis  who presents to the emergency department chest pain.  Patient was seen on 12/18 for chest pain.  At that point in time she had a CTA dissection protocol which did not show evidence of dissection or PE.  Did show some lung scarring and ground glass opacities.  Had EKG as well as high-sensitivity troponins that were undetectably low.  Sent home and reports that she has been having persistent pain as well as a cough that is productive of green sputum.  No fevers.  No sinus pain or congestion.  Does have some pain under her left breast that is worse with coughing and breathing.  Radiates from her back.  Reports being compliant with her Eliquis  5 mg twice daily       Prior to Admission medications  Medication Sig Start Date End Date Taking? Authorizing Provider  acetaminophen  (TYLENOL ) 325 MG tablet Take 2 tablets (650 mg total) by mouth every 6 (six) hours as needed for mild pain (pain score 1-3) or fever (or Fever >/= 101). 02/19/24  Yes Angiulli, Toribio PARAS, PA-C  alendronate  (FOSAMAX ) 70 MG tablet Take 1 tablet (70 mg total) by mouth every Saturday. Take with a full glass of water  on an empty stomach. 03/14/24  Yes Luking, Glendia LABOR, MD  apixaban  (ELIQUIS ) 5 MG TABS tablet Take 1 tablet (5 mg total) by mouth 2 (two) times daily. 07/21/24  Yes Alphonsa Glendia LABOR, MD  buprenorphine  (BUTRANS ) 15 MCG/HR Place onto the skin every 7 (seven) days for 28 days. **Remove old patch, fold and discard away from children and pets.** 08/04/24 09/03/24 Yes   busPIRone  (BUSPAR ) 15 MG tablet Take 1  tablet (15 mg) by mouth 3 times daily. 03/09/24  Yes Alphonsa Glendia LABOR, MD  calcitonin, salmon, (MIACALCIN /FORTICAL) 200 UNIT/ACT nasal spray Place 1 spray into alternate nostrils daily. 07/22/24  Yes   Calcium  Citrate 333 MG TABS Take one tablet by mouth 3 (three) times daily with meals. 03/17/24  Yes Nida, Ethelle ORN, MD  ferrous sulfate  (FEROSUL) 325 (65 FE) MG tablet Take 1 tablet (325 mg total) by mouth once a week. 08/10/24  Yes Alphonsa Glendia LABOR, MD  folic acid  (FOLVITE ) 1 MG tablet Take 1 tablet (1 mg total) by mouth daily. 05/04/24  Yes Alphonsa Glendia LABOR, MD  gabapentin  (NEURONTIN ) 300 MG capsule Take 1 capsule (300 mg total) by mouth 4 (four) times daily. 07/30/24  Yes Suzette Pac, MD  hydrocortisone  (CORTEF ) 10 MG tablet Take 2 tablets by mouth at 8:00 am and 1 tablet at lunch. (May double dose on sick days x 3-5 days) 06/17/24  Yes Nida, Ethelle ORN, MD  hydrOXYzine  (ATARAX ) 10 MG tablet Take 1 tablet (10 mg total) by mouth 3 (three) times daily as needed for anxiety 07/29/24  Yes Hoskins, Carolyn C, NP  levothyroxine  (SYNTHROID ) 50 MCG tablet Take 1 tablet by mouth daily before breakfast. 03/09/24 09/05/24 Yes Luking, Glendia LABOR, MD  lidocaine  (LIDODERM ) 5 % Place 1 patch onto the skin daily. Remove & Discard patch within 12 hours or as directed by MD 08/13/24  Yes  Kammerer, Megan L, DO  linaclotide  (LINZESS ) 145 MCG CAPS capsule Take 1 capsule (145 mcg total) by mouth daily before breakfast. 08/06/24  Yes Luking, Glendia LABOR, MD  methocarbamol  (ROBAXIN ) 500 MG tablet Take 2 tablets (1,000 mg total) by mouth every 6 (six) hours as needed for up to 7 days for muscle spasms. 08/13/24 08/22/24 Yes Kammerer, Megan L, DO  midodrine  (PROAMATINE ) 5 MG tablet Take 1 tablet (5 mg total) by mouth 2 (two) times daily with a meal. 03/09/24  Yes Luking, Glendia LABOR, MD  ondansetron  (ZOFRAN -ODT) 8 MG disintegrating tablet Dissolve 1 tablet (8 mg total) by mouth every 8 (eight) hours as needed. 08/05/24 08/05/25 Yes  Alphonsa Glendia LABOR, MD  oxyCODONE  (ROXICODONE ) 15 MG immediate release tablet Take 1 tablet (15 mg total) by mouth every 4 (four) hours as needed for pain. Patient taking differently: Take 15 mg by mouth every 4 (four) hours. 08/04/24  Yes   OXYGEN  Inhale 5 L into the lungs continuous.   Yes [provider]  pantoprazole  (PROTONIX ) 40 MG tablet Take 1 tablet (40 mg total) by mouth 2 (two) times daily. 03/09/24  Yes Alphonsa Glendia LABOR, MD  polyethylene glycol (MIRALAX  / GLYCOLAX ) 17 g packet Take 17 g by mouth 2 (two) times daily. 01/23/24  Yes Pearlean Manus, MD  potassium chloride  (KLOR-CON  M) 10 MEQ tablet Take 1 tablet (10 mEq total) by mouth 2 (two) times daily. 05/29/24  Yes Alphonsa Glendia LABOR, MD  revefenacin  (YUPELRI ) 175 MCG/3ML nebulizer solution Take 3 mLs (175 mcg total) by nebulization daily. 02/24/24  Yes Angiulli, Toribio PARAS, PA-C  rosuvastatin  (CRESTOR ) 20 MG tablet Take 1 tablet (20 mg total) by mouth daily. 03/09/24  Yes Luking, Scott A, MD  senna-docusate (SENOKOT-S) 8.6-50 MG tablet Take 2 tablets by mouth at bedtime. 01/23/24  Yes Pearlean Manus, MD  topiramate  (TOPAMAX ) 50 MG tablet Take 1 tablet (50 mg total) by mouth daily with lunch. 03/17/24  Yes Nida, Ethelle ORN, MD  torsemide  (DEMADEX ) 20 MG tablet Take 2 tablets by mouth every morning, may take 1 additional torsemide  at 1 PM when weight gain is more than 2 pounds in 1 day 06/03/24  Yes Luking, Scott A, MD  Vitamin D , Ergocalciferol , (DRISDOL ) 1.25 MG (50000 UNIT) CAPS capsule Take 1 capsule (50,000 Units total) by mouth every Sunday. 03/01/24  Yes Angiulli, Toribio PARAS, PA-C  buprenorphine  (BUTRANS ) 7.5 MCG/HR Apply 1 patch onto skin weekly, change every 7 days Patient not taking: Reported on 08/16/2024 07/07/24     Capsaicin -Menthol -Methyl Sal (CAPSAICIN -METHYL SAL-MENTHOL ) 0.025-1-12 % CREA Apply 1 Application topically 3 (three) times daily as needed. Patient not taking: Reported on 08/16/2024 05/23/24   Patsy Lenis, MD   oxyCODONE  (ROXICODONE ) 15 MG immediate release tablet Take 1 tablet (15 mg total) by mouth every 4 (four) hours as needed for pain Patient not taking: Reported on 08/16/2024 05/07/24     oxyCODONE  (ROXICODONE ) 15 MG immediate release tablet Take 1 tablet (15 mg total) by mouth every 4 (four) hours. 06/06/24 Patient not taking: Reported on 08/16/2024 06/05/24     Semaglutide ,0.25 or 0.5MG /DOS, (OZEMPIC , 0.25 OR 0.5 MG/DOSE,) 2 MG/3ML SOPN Inject 0.25 mg into the skin once a week. Patient not taking: Reported on 08/16/2024 07/09/24   Nida, Gebreselassie W, MD    Allergies: Bee venom, Contrast media [iodinated contrast media], Bactrim  [sulfamethoxazole -trimethoprim ], and Codeine    Review of Systems  Updated Vital Signs BP (!) 104/36 (BP Location: Right Arm)   Pulse 87  Temp 98.6 F (37 C) (Oral)   Resp 18   Ht 5' 10 (1.778 m)   Wt 108.9 kg   LMP 05/17/2016   SpO2 94%   BMI 34.44 kg/m   Physical Exam Vitals and nursing note reviewed.  Constitutional:      General: She is not in acute distress.    Appearance: She is well-developed.  HENT:     Head: Normocephalic and atraumatic.     Right Ear: External ear normal.     Left Ear: External ear normal.     Nose: Nose normal.  Eyes:     Extraocular Movements: Extraocular movements intact.     Conjunctiva/sclera: Conjunctivae normal.     Pupils: Pupils are equal, round, and reactive to light.  Cardiovascular:     Rate and Rhythm: Normal rate and regular rhythm.     Heart sounds: No murmur heard.    Comments: Chest wall tenderness under the left breast.  No rash noted. Pulmonary:     Effort: Pulmonary effort is normal. No respiratory distress.     Breath sounds: Normal breath sounds.     Comments: On 5 L nasal cannula Musculoskeletal:     Cervical back: Normal range of motion and neck supple.     Right lower leg: No edema.     Left lower leg: No edema.     Comments: Lidocaine  patch and buprenorphine  patch on her back   Skin:    General: Skin is warm and dry.  Neurological:     Mental Status: She is alert and oriented to person, place, and time. Mental status is at baseline.  Psychiatric:        Mood and Affect: Mood normal.     (all labs ordered are listed, but only abnormal results are displayed) Labs Reviewed  COMPREHENSIVE METABOLIC PANEL WITH GFR - Abnormal; Notable for the following components:      Result Value   Chloride 94 (*)    Glucose, Bld 111 (*)    All other components within normal limits  CBC - Abnormal; Notable for the following components:   WBC 10.7 (*)    RBC 3.42 (*)    Hemoglobin 9.3 (*)    HCT 30.3 (*)    RDW 17.9 (*)    Platelets 423 (*)    All other components within normal limits  RESP PANEL BY RT-PCR (RSV, FLU A&B, COVID)  RVPGX2  CULTURE, BLOOD (ROUTINE X 2)  CULTURE, BLOOD (ROUTINE X 2)  PRO BRAIN NATRIURETIC PEPTIDE  LACTIC ACID, PLASMA  BASIC METABOLIC PANEL WITH GFR  CBC  MAGNESIUM   TROPONIN T, HIGH SENSITIVITY    EKG: None  Radiology: DG Chest 2 View Result Date: 08/16/2024 CLINICAL DATA:  Cough and shortness of breath. EXAM: CHEST - 2 VIEW COMPARISON:  Radiograph and CT 3 days ago 08/13/2024 FINDINGS: Stable heart size and mediastinal contours. Prominent epicardial fat pad is well as epicardial fat on recent CT. Increased peribronchial thickening and interstitial opacities in the left lung. Mild scarring in the right upper lobe. No pneumothorax or large pleural effusion. Limited assessment of the osseous structures. IMPRESSION: Increased peribronchial thickening and interstitial opacities in the left lung, suspicious for bronchopneumonia. Electronically Signed   By: Andrea Gasman M.D.   On: 08/16/2024 17:07     Procedures   Medications Ordered in the ED  oxyCODONE  (Oxy IR/ROXICODONE ) immediate release tablet 15 mg (has no administration in time range)  midodrine  (PROAMATINE ) tablet 5 mg (has no  administration in time range)  busPIRone  (BUSPAR )  tablet 15 mg (has no administration in time range)  calcitonin (salmon) (MIACALCIN /FORTICAL) nasal spray 1 spray (has no administration in time range)  levothyroxine  (SYNTHROID ) tablet 50 mcg (has no administration in time range)  apixaban  (ELIQUIS ) tablet 5 mg (has no administration in time range)  topiramate  (TOPAMAX ) tablet 50 mg (has no administration in time range)  revefenacin  (YUPELRI ) nebulizer solution 175 mcg (has no administration in time range)  cefTRIAXone  (ROCEPHIN ) 2 g in sodium chloride  0.9 % 100 mL IVPB (has no administration in time range)  azithromycin  (ZITHROMAX ) 500 mg in sodium chloride  0.9 % 250 mL IVPB (has no administration in time range)  hydrocortisone  sodium succinate  (SOLU-CORTEF ) 100 MG injection 40 mg (has no administration in time range)  acetaminophen  (TYLENOL ) tablet 650 mg (has no administration in time range)    Or  acetaminophen  (TYLENOL ) suppository 650 mg (has no administration in time range)  polyethylene glycol (MIRALAX  / GLYCOLAX ) packet 17 g (has no administration in time range)  promethazine  (PHENERGAN ) tablet 12.5 mg (has no administration in time range)  guaiFENesin -dextromethorphan  (ROBITUSSIN DM) 100-10 MG/5ML syrup 15 mL (has no administration in time range)  potassium chloride  (KLOR-CON ) packet 40 mEq (has no administration in time range)  torsemide  (DEMADEX ) tablet 40 mg (has no administration in time range)  cefTRIAXone  (ROCEPHIN ) 2 g in sodium chloride  0.9 % 100 mL IVPB (0 g Intravenous Stopped 08/16/24 1839)  azithromycin  (ZITHROMAX ) 500 mg in sodium chloride  0.9 % 250 mL IVPB (0 mg Intravenous Stopped 08/16/24 1943)  HYDROmorphone  (DILAUDID ) injection 0.5 mg (0.5 mg Intravenous Given 08/16/24 1738)  ondansetron  (ZOFRAN ) injection 4 mg (4 mg Intravenous Given 08/16/24 1925)  HYDROmorphone  (DILAUDID ) injection 1 mg (1 mg Intravenous Given 08/16/24 1925)    Clinical Course as of 08/16/24 2108  Sun Aug 16, 2024  1917 Dr Pearlean [RP]     Clinical Course User Index [RP] Yolande Lamar BROCKS, MD                                 Medical Decision Making Amount and/or Complexity of Data Reviewed Labs: ordered. Radiology: ordered.  Risk Prescription drug management. Decision regarding hospitalization.   Kathleen [name] is a 63 year old female history of interstitial lung disease on 5 L nasal cannula, pulmonary sarcoidosis, previously on steroids with multiple vertebral compression fractures and chronic pain, CHF, atrial fibrillation on Eliquis  who presents to the emergency department chest pain.   Initial Ddx:  Pneumonia, URI, sarcoidosis, chronic pain, MI, PE, radicular pain  MDM/Course:  Patient presents emergency department with chest pain and cough.  Was recently seen here for this 3 days ago.  Did have a CTA that did not show any evidence of dissection or PE.  She is also on Eliquis  as an outpatient.  Since then reports a productive cough and decided to come back into the emergency department.  On exam she is not in acute distress.  She is on her home 5 L.  I do not appreciate any wheezing or rhonchi.  Does not appear to be grossly volume overloaded.  She had a chest x-ray that shows a possible left-sided bronchopneumonia which I suspect is causing her symptoms.  She was started on ceftriaxone  and azithromycin  and admitted to hospitalist given the fact that she is so weak and with her extensive pulmonary history.  EKG and troponin is not reflective of MI.  COVID  and flu negative.  Of note did have some low blood pressures but I suspect that these were erroneous because her arm was in the air.  When she was repositioned and accurate blood pressures were obtained she was normotensive.  Lactic acid also WNL.  Upon re-evaluation remained stable.  This patient presents to the ED for concern of complaints listed in HPI, this involves an extensive number of treatment options, and is a complaint that carries with it a high risk of  complications and morbidity. Disposition including potential need for admission considered.   Dispo: DC Home. Return precautions discussed including, but not limited to, those listed in the AVS. Allowed pt time to ask questions which were answered fully prior to dc.  I have reviewed the patients home medications and made adjustments as needed Records reviewed ED Visit Notes The following labs were independently interpreted: Chemistry and show no acute abnormality I independently reviewed the following imaging with scope of interpretation limited to determining acute life threatening conditions related to emergency care: Chest x-ray and agree with the radiologist interpretation with the following exceptions: none I personally reviewed and interpreted cardiac monitoring: normal sinus rhythm  I personally reviewed and interpreted the pt's EKG: see above for interpretation  Consults: Hospitalist  Portions of this note were generated with Scientist, clinical (histocompatibility and immunogenetics). Dictation errors may occur despite best attempts at proofreading.     Final diagnoses:  Pneumonia of left lung due to infectious organism, unspecified part of lung  Chest pain, unspecified type  Pulmonary sarcoidosis    ED Discharge Orders     None          Yolande Lamar BROCKS, MD 08/16/24 2108  "

## 2024-08-16 NOTE — ED Notes (Signed)
Pt taken xray

## 2024-08-17 LAB — BASIC METABOLIC PANEL WITH GFR
Anion gap: 5 (ref 5–15)
BUN: 8 mg/dL (ref 8–23)
CO2: 38 mmol/L — ABNORMAL HIGH (ref 22–32)
Calcium: 9.8 mg/dL (ref 8.9–10.3)
Chloride: 98 mmol/L (ref 98–111)
Creatinine, Ser: 0.65 mg/dL (ref 0.44–1.00)
GFR, Estimated: >60 mL/min
Glucose, Bld: 106 mg/dL — ABNORMAL HIGH (ref 70–99)
Potassium: 3.8 mmol/L (ref 3.5–5.1)
Sodium: 141 mmol/L (ref 135–145)

## 2024-08-17 LAB — CBC
HCT: 30.4 % — ABNORMAL LOW (ref 36.0–46.0)
Hemoglobin: 9.4 g/dL — ABNORMAL LOW (ref 12.0–15.0)
MCH: 27.3 pg (ref 26.0–34.0)
MCHC: 30.9 g/dL (ref 30.0–36.0)
MCV: 88.4 fL (ref 80.0–100.0)
Platelets: 420 K/uL — ABNORMAL HIGH (ref 150–400)
RBC: 3.44 MIL/uL — ABNORMAL LOW (ref 3.87–5.11)
RDW: 17.8 % — ABNORMAL HIGH (ref 11.5–15.5)
WBC: 11 K/uL — ABNORMAL HIGH (ref 4.0–10.5)
nRBC: 0 % (ref 0.0–0.2)

## 2024-08-17 MED ORDER — METHOCARBAMOL 750 MG PO TABS
750.0000 mg | ORAL_TABLET | Freq: Three times a day (TID) | ORAL | Status: DC | PRN
  Administered 2024-08-17 – 2024-08-18 (×2): 750 mg via ORAL
  Filled 2024-08-17 (×2): qty 1

## 2024-08-17 MED ORDER — LIDOCAINE 5 % EX PTCH
1.0000 | MEDICATED_PATCH | CUTANEOUS | Status: DC
  Administered 2024-08-17 – 2024-08-26 (×10): 1 via TRANSDERMAL
  Filled 2024-08-17 (×7): qty 1

## 2024-08-17 MED ORDER — PANTOPRAZOLE SODIUM 40 MG IV SOLR
40.0000 mg | Freq: Two times a day (BID) | INTRAVENOUS | Status: DC
  Administered 2024-08-17 – 2024-08-18 (×4): 40 mg via INTRAVENOUS
  Filled 2024-08-17 (×4): qty 10

## 2024-08-17 MED ORDER — ONDANSETRON HCL 4 MG/2ML IJ SOLN: 4.0000 mg | Freq: Four times a day (QID) | INTRAMUSCULAR | Status: DC | PRN

## 2024-08-17 NOTE — Progress Notes (Signed)
 Patient was admitted yesterday for pneumonia and was started on IV ceftriaxone  and doxycycline .   RN called due to patient requesting transfer to [facility].  At bedside, she was reassured that she was already receiving same treatment that will be given at [facility].  She complained of chest pain which she thought was due to cardiac origin, however, troponin and proBNP were negative.  The chest pain was pleuritic in nature and was reproducible, it also worsens with taking deep breaths and was in similar area for pneumonia with chest x-ray.  Patient insists that she wants to be able to see cardiologist, she was told that cardiologist can be consulted to see her here in the morning, but she was adamant to be transferred to [facility]  She was already on oxycodone , lidocaine  patch was offered.  Patient continued to request for transfer to [facility]. Transfer order based on patient's request only was placed.

## 2024-08-17 NOTE — TOC Initial Note (Signed)
 Transition of Care [facility]) - Initial/Assessment Note    Patient Details  Name: Kathleen [name] MRN: [id number] Date of Birth: [birth year]  Transition of [facility] CM/SW Contact:    Ronnald MARLA Sil, RN Phone Number: 08/17/2024, 5:51 PM  Clinical Narrative:                  Patient with PMH of MMP's including ILD / Sarcoidosis on Home O2 at 5L and L1, L3, L4, & L5 Compression Fx's, with 6 previous admissions since 4/12 - 12/11/23 for Acute L4 Comp Fx & Ua Retention, 5/9 - 01/08/24 for Acute L3 Comp Fx, 5/26 - 01/23/24 for L1 Comp Fx, 5/31 - 02/06/24 for Rt Sacral Alar & Lt Pubic Symphysis Fx, 8/28 - 9/5/ for UTI & Acute Resp Failure, and 9/15 - 05/23/24 for Acute CHF & New pAF, was also at [facility] rehab 4/16 - 5/6 @ [facility] and CIR 6/12 - 02/25/24,  admitted to [facility] yesterday for Left Side PNA with orders to Transfer to [facility] for higher level of care  CM attempted visit with Patient at bedside, however she was being transferred to stretcher for transport to [facility], appeared apprehensive and c/o pain.  CM spoke to Spouse who confirmed she lives with him, requires some assist with ADLs, does NOT drive and reliant on Family, is established with Home O2 5L continuous through [employer].  Spouse also confirmed availability of an adjustable bed, a RW, bariatric BSC, and wheelchair at home.    Spouse also indicated she was previously opened with [facility] and she'll probably need them again after this.  Zach with [employer] and Darleene with [facility] made aware of patient's admission.  IPCM team will continue to follow along and assist as appropriate when d/c needs are identified.   Expected Discharge Plan: Home w Home Health Services Barriers to Discharge: Continued Medical Work up   Patient Goals and CMS Choice Patient states their goals for this hospitalization and ongoing recovery are:: Return Home with [facility]  Expected Discharge Plan and Services  Post Acute Care Choice: Home Health Living arrangements for the  past 2 months: Single Family Home HH Agency: [facility]  Prior Living Arrangements/Services Living arrangements for the past 2 months: Single Family Home Lives with:: Spouse (Has support from Daughter, Mom, Sibling, and Friend) Current home services: DME (Home O2 & DME thru [employer])  Activities of Daily Living  Permission Sought/Granted  Emotional Assessment Appearance:: Appears older than stated age Attitude/Demeanor/Rapport: Apprehensive, Other (comment) (Laying on stretcher, moaning and c/o pain) Affect (typically observed): Restless, Agitated Orientation: : Oriented to Self, Oriented to Place, Oriented to  Time, Oriented to Situation   Admission diagnosis:  PNA (pneumonia) [J18.9] Patient Active Problem List   Diagnosis Date Noted   PNA (pneumonia) 08/16/2024   Iron  deficiency anemia 07/31/2024   Acute on chronic heart failure with preserved ejection fraction (HFpEF) (HCC) 05/11/2024   UTI (urinary tract infection) 04/23/2024   Osteoporosis with current pathological fracture 03/17/2024   Anxiety 02/11/2024   Spinal compression fracture (HCC) 01/29/2024   Lower back pain 01/26/2024   Closed compression fracture of L3 lumbar vertebra, initial encounter (HCC) 01/21/2024   Intractable low back pain 01/03/2024   Ambulatory dysfunction 12/11/2023   Uncontrolled pain 12/08/2023   Pressure injury of skin 12/08/2023   Intractable back pain 12/07/2023   Prediabetes 10/28/2023   Ankle fracture, bimalleolar, closed, left, sequela 03/10/2023   Closed fracture of left ankle 03/10/2023   Syndesmotic  disruption of ankle, left, initial encounter 03/10/2023   Lumbar transverse process fracture (HCC) 01/02/2023   Acute on chronic hypoxic respiratory failure (HCC) 12/13/2022   Depression, major, single episode, moderate (HCC) 09/10/2022   Aortic atherosclerosis 09/10/2022   Chronic respiratory failure with hypoxia (HCC) 05/22/2021   Anxiety and depression 05/22/2021   Vitamin  D deficiency 05/09/2021   Left foot drop 01/09/2021   Pulmonary HTN (HCC) 10/12/2020   Encounter for long-term opiate analgesic use 02/02/2020   Interstitial lung disease (HCC) 04/14/2019   GERD (gastroesophageal reflux disease) 10/15/2017   Personal history of noncompliance with medical treatment, presenting hazards to health 04/26/2017   Hypothyroidism 01/15/2017   Adrenal insufficiency (Addison's disease) (HCC) 08/22/2016   Symptomatic bradycardia    Adrenal insufficiency 08/17/2016   Hypokalemia 08/17/2016   Chronic pain syndrome 04/20/2016   Irritable bowel syndrome with constipation 11/14/2015   Avascular necrosis of bone of hip, left (HCC) 04/05/2015   Post herpetic neuralgia 10/22/2014   Mixed hyperlipidemia 08/25/2014   Obstructive sleep apnea 02/18/2014   Fibromyalgia 02/11/2013   Bilateral lower extremity edema 01/22/2013   Hypotension 01/16/2013   Fibrocystic breast disease in female 04/09/2012   Sarcoidosis 06/21/2008   PCP:  Alphonsa Glendia LABOR, MD Pharmacy:   [facility] [address] [place] [address] Phone: [phone] Fax: [phone]  [place] [facility] [address] [place] [address] Phone: [phone] Fax: [phone]  [facility] [address] [place] [address] Phone: [phone] Fax: [phone]   Social Drivers of Health (SDOH) Social History: SDOH Screenings   Food Insecurity: No Food Insecurity (08/16/2024)  Housing: Low Risk (08/16/2024)  Transportation Needs: No Transportation Needs (08/16/2024)  Utilities: Not At Risk (08/16/2024)  Alcohol Screen: Low Risk (09/27/2023)  Depression (PHQ2-9): Low Risk (08/06/2024)  Financial Resource Strain: Low Risk (09/27/2023)  Physical Activity: Inactive (09/27/2023)  Social Connections: Socially Integrated (01/21/2024)  Stress: No Stress Concern Present (09/27/2023)  Tobacco Use:  Low Risk (08/16/2024)  Health Literacy: Medium Risk (12/20/2023)   Received from [facility]   SDOH Interventions:     Readmission Risk Interventions    08/17/2024    5:43 PM 05/12/2024   11:28 AM 04/27/2024    9:59 AM  Readmission Risk Prevention Plan  Transportation Screening Complete Complete Complete  Medication Review Oceanographer) Complete Complete Complete  PCP or Specialist appointment within 3-5 days of discharge Complete Complete   HRI or Home Care Consult  Complete Complete  SW Recovery Care/Counseling Consult Complete Complete Complete  Palliative Care Screening Not Applicable Not Applicable Not Applicable  Skilled Nursing Facility Not Applicable Not Applicable Not Applicable

## 2024-08-17 NOTE — Plan of Care (Signed)
  Problem: Activity: Goal: Risk for activity intolerance will decrease Outcome: Progressing   Problem: Nutrition: Goal: Adequate nutrition will be maintained Outcome: Progressing   Problem: Coping: Goal: Level of anxiety will decrease Outcome: Progressing   Problem: Pain Managment: Goal: General experience of comfort will improve and/or be controlled Outcome: Progressing   Problem: Safety: Goal: Ability to remain free from injury will improve Outcome: Progressing

## 2024-08-17 NOTE — Progress Notes (Signed)
 " PROGRESS NOTE    Kathleen [name]  FMW:[id number] DOB: 1961/04/17 DOA: 08/16/2024  PCP: Alphonsa Glendia LABOR, MD    Chief Complaint  Patient presents with   Chest Pain    Brief Narrative:  As per H&P written by Dr. Pearlean on 08/16/2024 Kathleen [name] is a 63 y.o. female with medical history significant for chronic respiratory failure on 5 L, Addison's disease, CHF, hypothyroidism, ILD and OSA. Patient presented to the ED with complaints of congestion, chest pain, cough and difficulty breathing over the past several days.  She was in the ED with similar complaints 12/18, CTA chest was negative for PE, and troponins EKG were unremarkable. Chest pain is underneath her left breast, and worse with deep breathing and coughing.  She reports cough productive of greenish sputum.  No fevers.  She reports dry heaving, no diarrhea, no abdominal pain.  She reports of pain with urination over the past few days.   She has been compliant with Eliquis .   ED Course: Temperature 98.2.  Heart rate 80s.  Respiratory rate 16-24.  Blood pressure systolic down to 97 improved to 140s.  O2 sats greater than 92% on 5 L. Chest x-ray shows left bronchopneumonia. Lactic acid 1.3. WBC 10.7. proBNP eight 7.8. Troponin 17. IV ceftriaxone  and azithromycin  started.  Hospitalist to admit for pneumonia.  Assessment & Plan: 1-left lung pneumonia - Patient has rule out for sepsis - Continue IV antibiotics - Continue mucolytics and bronchodilator management - Follow clinical response.  2-Addison's disease - Will continue stress dose using Solu-Cortef  - Continue midodrine  and nasal calcitonin - Continue following electrolytes stability and maintain adequate hydration.  3-chronic diastolic heart failure - Stable and compensated - Continue home dose torsemide .  4-prolonged QT - Magnesium  within normal limits - Follow potassium and replete as needed - Continue minimizing agents that can further prolong  QT.  5-chronic respiratory failure, interstitial lung disease and history of sarcoidosis/obstructive sleep apnea - Patient is chronically on 5 L supplementation - Continue to maintain oxygen  saturation above 90% - Continue bronchodilator management.  6-chronic pain syndrome/fibromyalgia - Continue analgesic therapy - At this moment would recommend workup with MRI to rule out any muscle tear or nerve involvement. - Continue the use of Lidoderm  patch, heating pads and muscle relaxant.  7-depression/anxiety - Continue BuSpar  - No suicidal ideation or hallucination.  8-hypothyroidism - Continue Synthroid .  DVT prophylaxis: Eliquis  Code Status: Full code. Family Communication: Mom and father at bedside. Disposition:   Status is: Inpatient Remains inpatient appropriate because: Continue IV therapy.   Consultants:  None  Procedures:  See below for x-ray reports.  Antimicrobials:  Ceftriaxone  and doxycycline .   Subjective: Afebrile, no chest pain, no vomiting.  Patient reports not feeling good and is complaining of significant ongoing left-sided back pain.  Patient expressed having some intermittent nausea.  Objective: Vitals:   08/17/24 0403 08/17/24 0537 08/17/24 0758 08/17/24 1407  BP: 96/61 (!) 112/57  (!) 106/45  Pulse: 86 75  82  Resp: 19 18    Temp: 99 F (37.2 C)   98.3 F (36.8 C)  TempSrc: Oral   Oral  SpO2: 98%  97% 99%  Weight:      Height:        Intake/Output Summary (Last 24 hours) at 08/17/2024 1635 Last data filed at 08/16/2024 1943 Gross per 24 hour  Intake 250 ml  Output --  Net 250 ml   Filed Weights   08/16/24 1619  Weight: 108.9 kg  Examination:  General exam: Appears in moderate distress secondary to ongoing worsening left-sided back pain.  Good saturation on chronic supplementation. Respiratory system: No wheezing or crackles appreciated on exam; no using accessory muscle.  Positive scattered rhonchi appreciated on  exam. Cardiovascular system: Rate controlled, no rubs, no gallops, no JVD. Gastrointestinal system: Abdomen is obese, nondistended, soft and without guarding.  Positive bowel sounds. Central nervous system: Generally weak.  No focal neurological deficits.  Patient denies urinary incontinence or retention; no fecal incontinence. Extremities: No cyanosis or clubbing. Skin: No petechiae. Psychiatry: Judgement and insight appear normal.  Overall flat affect appreciated on exam; intermittent episodes of anxiety also appreciated.  Data Reviewed: I have personally reviewed following labs and imaging studies  CBC: Recent Labs  Lab 08/13/24 0238 08/16/24 1642 08/17/24 0458  WBC 11.7* 10.7* 11.0*  NEUTROABS 8.6*  --   --   HGB 10.0* 9.3* 9.4*  HCT 33.4* 30.3* 30.4*  MCV 90.5 88.6 88.4  PLT 439* 423* 420*    Basic Metabolic Panel: Recent Labs  Lab 08/13/24 0238 08/16/24 1642 08/17/24 0458  NA 140 139 141  K 3.7 3.5 3.8  CL 95* 94* 98  CO2 40* 31 38*  GLUCOSE 131* 111* 106*  BUN 9 9 8   CREATININE 0.87 0.89 0.65  CALCIUM  9.9 9.8 9.8  MG  --  2.4  --     GFR: Estimated Creatinine Clearance: 98.7 mL/min (by C-G formula based on SCr of 0.65 mg/dL).  Liver Function Tests: Recent Labs  Lab 08/16/24 1642  AST 19  ALT 12  ALKPHOS 93  BILITOT 0.3  PROT 6.9  ALBUMIN 3.8    CBG: No results for input(s): GLUCAP in the last 168 hours.   Recent Results (from the past 240 hours)  Resp panel by RT-PCR (RSV, Flu A&B, Covid) Anterior Nasal Swab     Status: None   Collection Time: 08/16/24  4:27 PM   Specimen: Anterior Nasal Swab  Result Value Ref Range Status   SARS Coronavirus 2 by RT PCR NEGATIVE NEGATIVE Final    Comment: (NOTE) SARS-CoV-2 target nucleic acids are NOT DETECTED.  The SARS-CoV-2 RNA is generally detectable in upper respiratory specimens during the acute phase of infection. The lowest concentration of SARS-CoV-2 viral copies this assay can detect is 138  copies/mL. A negative result does not preclude SARS-Cov-2 infection and should not be used as the sole basis for treatment or other patient management decisions. A negative result may occur with  improper specimen collection/handling, submission of specimen other than nasopharyngeal swab, presence of viral mutation(s) within the areas targeted by this assay, and inadequate number of viral copies(<138 copies/mL). A negative result must be combined with clinical observations, patient history, and epidemiological information. The expected result is Negative.  Fact Sheet for Patients:  bloggercourse.com  Fact Sheet for Healthcare Providers:  seriousbroker.it  This test is no t yet approved or cleared by the [place]  FDA and  has been authorized for detection and/or diagnosis of SARS-CoV-2 by FDA under an Emergency Use Authorization (EUA). This EUA will remain  in effect (meaning this test can be used) for the duration of the COVID-19 declaration under Section 564(b)(1) of the Act, 21 U.S.C.section 360bbb-3(b)(1), unless the authorization is terminated  or revoked sooner.       Influenza A by PCR NEGATIVE NEGATIVE Final   Influenza B by PCR NEGATIVE NEGATIVE Final    Comment: (NOTE) The Xpert Xpress SARS-CoV-2/FLU/RSV plus assay is intended as an  aid in the diagnosis of influenza from Nasopharyngeal swab specimens and should not be used as a sole basis for treatment. Nasal washings and aspirates are unacceptable for Xpert Xpress SARS-CoV-2/FLU/RSV testing.  Fact Sheet for Patients: bloggercourse.com  Fact Sheet for Healthcare Providers: seriousbroker.it  This test is not yet approved or cleared by the [place]  FDA and has been authorized for detection and/or diagnosis of SARS-CoV-2 by FDA under an Emergency Use Authorization (EUA). This EUA will remain in effect (meaning  this test can be used) for the duration of the COVID-19 declaration under Section 564(b)(1) of the Act, 21 U.S.C. section 360bbb-3(b)(1), unless the authorization is terminated or revoked.     Resp Syncytial Virus by PCR NEGATIVE NEGATIVE Final    Comment: (NOTE) Fact Sheet for Patients: bloggercourse.com  Fact Sheet for Healthcare Providers: seriousbroker.it  This test is not yet approved or cleared by the [place]  FDA and has been authorized for detection and/or diagnosis of SARS-CoV-2 by FDA under an Emergency Use Authorization (EUA). This EUA will remain in effect (meaning this test can be used) for the duration of the COVID-19 declaration under Section 564(b)(1) of the Act, 21 U.S.C. section 360bbb-3(b)(1), unless the authorization is terminated or revoked.  Performed at [facility], [address], [place], [place] [address]   Blood culture (routine x 2)     Status: None (Preliminary result)   Collection Time: 08/16/24  5:53 PM   Specimen: BLOOD RIGHT HAND  Result Value Ref Range Status   Specimen Description BLOOD RIGHT HAND  Final   Special Requests AEROBIC BOTTLE ONLY Blood Culture adequate volume  Final   Culture   Final    NO GROWTH < 24 HOURS Performed at [facility], [address], [place], [place] [address]    Report Status PENDING  Incomplete  Blood culture (routine x 2)     Status: None (Preliminary result)   Collection Time: 08/16/24  6:00 PM   Specimen: Left Antecubital; Blood  Result Value Ref Range Status   Specimen Description LEFT ANTECUBITAL  Final   Special Requests   Final    BOTTLES DRAWN AEROBIC AND ANAEROBIC Blood Culture adequate volume   Culture   Final    NO GROWTH < 24 HOURS Performed at [facility], [address] [place], [place] [address]    Report Status PENDING  Incomplete     Radiology Studies: DG Chest 2 View Result Date: 08/16/2024 CLINICAL DATA:  Cough and shortness of  breath. EXAM: CHEST - 2 VIEW COMPARISON:  Radiograph and CT 3 days ago 08/13/2024 FINDINGS: Stable heart size and mediastinal contours. Prominent epicardial fat pad is well as epicardial fat on recent CT. Increased peribronchial thickening and interstitial opacities in the left lung. Mild scarring in the right upper lobe. No pneumothorax or large pleural effusion. Limited assessment of the osseous structures. IMPRESSION: Increased peribronchial thickening and interstitial opacities in the left lung, suspicious for bronchopneumonia. Electronically Signed   By: Andrea Gasman M.D.   On: 08/16/2024 17:07    Scheduled Meds:  apixaban   5 mg Oral BID   busPIRone   15 mg Oral TID   calcitonin (salmon)  1 spray Alternating Nares Daily   guaiFENesin -dextromethorphan   15 mL Oral Q8H   hydrocortisone  sod succinate (SOLU-CORTEF ) inj  40 mg Intravenous Q8H   levothyroxine   50 mcg Oral QAC breakfast   lidocaine   1 patch Transdermal Q24H   midodrine   5 mg Oral BID WC   pantoprazole  (PROTONIX ) IV  40 mg Intravenous Q12H   revefenacin   175 mcg Nebulization Daily   topiramate   50 mg Oral Q lunch   torsemide   40 mg Oral Daily   Continuous Infusions:  cefTRIAXone  (ROCEPHIN )  IV     doxycycline  (VIBRAMYCIN ) IV       LOS: 1 day    Time spent: 50 minutes    Eric Nunnery, MD [facility]   To contact the attending provider between 7A-7P or the covering provider during after hours 7P-7A, please log into the web site [email or web address] and access using universal [place] password for that web site. If you do not have the password, please call the hospital operator.  08/17/2024, 4:35 PM    "

## 2024-08-17 NOTE — Progress Notes (Signed)
 Patient transferred to [facility] via [facility] in stable condition. Report called to [facility] RN.

## 2024-08-18 ENCOUNTER — Inpatient Hospital Stay (HOSPITAL_COMMUNITY)

## 2024-08-18 ENCOUNTER — Telehealth: Payer: Self-pay | Admitting: *Deleted

## 2024-08-18 ENCOUNTER — Encounter: Payer: Self-pay | Admitting: *Deleted

## 2024-08-18 DIAGNOSIS — J9611 Chronic respiratory failure with hypoxia: Secondary | ICD-10-CM | POA: Diagnosis not present

## 2024-08-18 DIAGNOSIS — R52 Pain, unspecified: Secondary | ICD-10-CM | POA: Diagnosis not present

## 2024-08-18 DIAGNOSIS — J189 Pneumonia, unspecified organism: Secondary | ICD-10-CM | POA: Diagnosis not present

## 2024-08-18 DIAGNOSIS — G8929 Other chronic pain: Secondary | ICD-10-CM | POA: Diagnosis not present

## 2024-08-18 DIAGNOSIS — S22060A Wedge compression fracture of T7-T8 vertebra, initial encounter for closed fracture: Secondary | ICD-10-CM | POA: Diagnosis not present

## 2024-08-18 DIAGNOSIS — M549 Dorsalgia, unspecified: Secondary | ICD-10-CM

## 2024-08-18 DIAGNOSIS — E271 Primary adrenocortical insufficiency: Secondary | ICD-10-CM | POA: Diagnosis not present

## 2024-08-18 LAB — GLUCOSE, CAPILLARY: Glucose-Capillary: 161 mg/dL — ABNORMAL HIGH (ref 70–99)

## 2024-08-18 MED ORDER — GADOBUTROL 1 MMOL/ML IV SOLN
10.0000 mL | Freq: Once | INTRAVENOUS | Status: AC | PRN
  Administered 2024-08-18: 10 mL via INTRAVENOUS

## 2024-08-18 MED ORDER — PANTOPRAZOLE SODIUM 40 MG PO TBEC
40.0000 mg | DELAYED_RELEASE_TABLET | Freq: Two times a day (BID) | ORAL | Status: DC
  Administered 2024-08-18 – 2024-08-26 (×16): 40 mg via ORAL
  Filled 2024-08-18 (×11): qty 1

## 2024-08-18 MED ORDER — LORAZEPAM 0.5 MG PO TABS
0.5000 mg | ORAL_TABLET | Freq: Four times a day (QID) | ORAL | Status: AC | PRN
  Administered 2024-08-18 (×2): 0.5 mg via ORAL
  Filled 2024-08-18 (×2): qty 1

## 2024-08-18 MED ORDER — HYDROCORTISONE SOD SUC (PF) 100 MG IJ SOLR
100.0000 mg | Freq: Three times a day (TID) | INTRAMUSCULAR | Status: DC
  Administered 2024-08-18 – 2024-08-20 (×5): 100 mg via INTRAVENOUS
  Filled 2024-08-18 (×6): qty 2

## 2024-08-18 MED ORDER — METHOCARBAMOL 750 MG PO TABS
750.0000 mg | ORAL_TABLET | Freq: Three times a day (TID) | ORAL | Status: DC
  Administered 2024-08-18 – 2024-08-26 (×24): 750 mg via ORAL
  Filled 2024-08-18 (×17): qty 1

## 2024-08-18 MED ORDER — DOXYCYCLINE HYCLATE 100 MG PO TABS
100.0000 mg | ORAL_TABLET | Freq: Two times a day (BID) | ORAL | Status: AC
  Administered 2024-08-18 – 2024-08-22 (×8): 100 mg via ORAL
  Filled 2024-08-18 (×8): qty 1

## 2024-08-18 MED ORDER — MIDODRINE HCL 5 MG PO TABS
10.0000 mg | ORAL_TABLET | Freq: Three times a day (TID) | ORAL | Status: DC
  Administered 2024-08-19 – 2024-08-26 (×23): 10 mg via ORAL
  Filled 2024-08-18 (×15): qty 2

## 2024-08-18 MED ORDER — SODIUM CHLORIDE 0.9 % IV BOLUS
250.0000 mL | Freq: Once | INTRAVENOUS | Status: AC
  Administered 2024-08-18: 250 mL via INTRAVENOUS

## 2024-08-18 NOTE — Plan of Care (Signed)
" °  Problem: Pain Managment: Goal: General experience of comfort will improve and/or be controlled Outcome: Progressing   Problem: Safety: Goal: Ability to remain free from injury will improve Outcome: Progressing   Problem: Activity: Goal: Risk for activity intolerance will decrease Outcome: Not Progressing   Problem: Coping: Goal: Level of anxiety will decrease Outcome: Not Progressing   "

## 2024-08-18 NOTE — Progress Notes (Signed)
 Patient off unit for MRI.

## 2024-08-18 NOTE — Progress Notes (Signed)
 Patient is complain of chest pain that is causing her left arm to hurst as well MD called to make aware. EKG ordered.

## 2024-08-18 NOTE — Progress Notes (Addendum)
 " PROGRESS NOTE    Kathleen [name]  FMW:[id number] DOB: Jul 08, 1961 DOA: 08/16/2024 PCP: Alphonsa Glendia LABOR, MD   Brief Narrative:  Kathleen [name] is a 63 y.o. female with medical history significant for chronic respiratory failure on 5 L, Addison's disease, CHF, hypothyroidism, ILD and OSA. Patient presented to the ED with complaints of congestion, chest pain, cough and difficulty breathing over the past several days.  She was in the ED with similar complaints 12/18, CTA chest was negative for PE, and troponins EKG were unremarkable. Chest pain is underneath her left breast, and worse with deep breathing and coughing.  She reports cough productive of greenish sputum.  No fevers.    Patient and husband also reported left mid back/low back pain ongoing for past couple of weeks.  She says she has been basically bedridden due to severity of the pain.  Was admitted at [facility] hospital for treatment of pneumonia however patient and family was adamant about transfer to [facility].   Assessment & Plan:   Principal Problem:   PNA (pneumonia) Active Problems:   Interstitial lung disease (HCC)   Chronic respiratory failure with hypoxia (HCC)   Anxiety and depression   Sarcoidosis   Fibromyalgia   Obstructive sleep apnea   Chronic pain syndrome   Hypothyroidism   Adrenal insufficiency (Addison's disease) (HCC)   Depression, major, single episode, moderate (HCC)  1-left lung pneumonia - Will continue treatment with IV ceftriaxone , azithromycin , supportive care , mucolytic's, bronchodilator therapy -Chronic respiratory failure on home oxygen  - CTA 12/18 with negative for PE/consolidation.  Chest x-ray 12/22 concerning for pneumonia  2-acute on chronic low back/mid back pain.  -Patient has history of lumbar compression vertebral fractures in the past.  Per husband, she has been basically bedridden for the past 2 to 3 weeks due to severity of pain. - Will continue Robaxin  as needed, Norco, lidocaine  patch  etc. -Will obtain MRI of thoracolumbar spine   2-Addison's disease - Will continue high-dose Solu-Cortef  40 mg 3 times daily, taper down slowly. - Continue midodrine  and nasal calcitonin - Continue following electrolytes stability and maintain adequate hydration.   3-chronic diastolic heart failure - Stable and compensated - Continue home dose torsemide .   4-prolonged QT - Magnesium  within normal limits - Follow potassium and replete as needed - Continue minimizing agents that can further prolong QT.   5-chronic respiratory failure, interstitial lung disease and history of sarcoidosis/obstructive sleep apnea - Patient is chronically on 5 L supplementation - Continue to maintain oxygen  saturation above 90% - Continue bronchodilator management.     7-depression/anxiety - Continue BuSpar  - No suicidal ideation or hallucination.   8-hypothyroidism - Continue Synthroid .   DVT prophylaxis: Eliquis  Code Status: Full code. Family Communication: spouse at bedside. Disposition:    Status is: Inpatient Remains inpatient appropriate because: Continue IV therapy.   Subjective:  Patient has been complaining of severe left flank/lower back/mid back/left rib cage pain.  This is out of proportion to exam.  Patient screams in pain even with slightest touch is on the skin of the left rib cage area.  She also complains of severe pain on the left low back region/mid back area.  She is able to move both lower extremities okay.  Denies shooting pain down the legs.  Objective: Vitals:   08/18/24 0535 08/18/24 0809 08/18/24 1007 08/18/24 1218  BP: 119/63  (!) 102/52 (!) 108/50  Pulse: 91 (!) 58 75   Resp: 18 18 18    Temp: 97.8  F (36.6 C)  (!) 97.5 F (36.4 C)   TempSrc: Oral  Oral   SpO2: 98% 99% 100%   Weight:      Height:       No intake or output data in the 24 hours ending 08/18/24 1650 Filed Weights   08/16/24 1619  Weight: 108.9 kg    Examination:  General: Patient  appears to be in distress due to pain HEENT: Moist oral mucosa Chest: Clear to auscultation bilaterally, diminished bilaterally, no wheezes CVs: S1, S2, no murmur, regular rhythm Abdomen: Soft, nontender to palpation MSK: Significant pain even with slightest touch on the left lumbar area/underneath left rib cage/thoracolumbar spinal area.   Data Reviewed: I have personally reviewed following labs and imaging studies  CBC: Recent Labs  Lab 08/13/24 0238 08/16/24 1642 08/17/24 0458  WBC 11.7* 10.7* 11.0*  NEUTROABS 8.6*  --   --   HGB 10.0* 9.3* 9.4*  HCT 33.4* 30.3* 30.4*  MCV 90.5 88.6 88.4  PLT 439* 423* 420*   Basic Metabolic Panel: Recent Labs  Lab 08/13/24 0238 08/16/24 1642 08/17/24 0458  NA 140 139 141  K 3.7 3.5 3.8  CL 95* 94* 98  CO2 40* 31 38*  GLUCOSE 131* 111* 106*  BUN 9 9 8   CREATININE 0.87 0.89 0.65  CALCIUM  9.9 9.8 9.8  MG  --  2.4  --    GFR: Estimated Creatinine Clearance: 98.7 mL/min (by C-G formula based on SCr of 0.65 mg/dL). Liver Function the left f: Recent Labs  Lab 08/16/24 1642  AST 19  ALT 12  ALKPHOS 93  BILITOT 0.3  PROT 6.9  ALBUMIN 3.8   No results for input(s): LIPASE, AMYLASE in the last 168 hours. No results for input(s): AMMONIA  in the last 168 hours. Coagulation Profile: No results for input(s): INR, PROTIME in the last 168 hours. Cardiac Enzymes: No results for input(s): CKTOTAL, CKMB, CKMBINDEX, TROPONINI in the last 168 hours. BNP (last 3 results) Recent Labs    05/30/24 2251 08/16/24 1642  PROBNP 192.0 87.8   HbA1C: No results for input(s): HGBA1C in the last 72 hours. CBG: Recent Labs  Lab 08/18/24 0250  GLUCAP 161*   Lipid Profile: No results for input(s): CHOL, HDL, LDLCALC, TRIG, CHOLHDL, LDLDIRECT in the last 72 hours. Thyroid  Function Tests: No results for input(s): TSH, T4TOTAL, FREET4, T3FREE, THYROIDAB in the last 72 hours. Anemia Panel: No results  for input(s): VITAMINB12, FOLATE, FERRITIN, TIBC, IRON , RETICCTPCT in the last 72 hours. Sepsis Labs: Recent Labs  Lab 08/16/24 1800  LATICACIDVEN 1.3    Recent Results (from the past 240 hours)  Resp panel by RT-PCR (RSV, Flu A&B, Covid) Anterior Nasal Swab     Status: None   Collection Time: 08/16/24  4:27 PM   Specimen: Anterior Nasal Swab  Result Value Ref Range Status   SARS Coronavirus 2 by RT PCR NEGATIVE NEGATIVE Final    Comment: (NOTE) SARS-CoV-2 target nucleic acids are NOT DETECTED.  The SARS-CoV-2 RNA is generally detectable in upper respiratory specimens during the acute phase of infection. The lowest concentration of SARS-CoV-2 viral copies this assay can detect is 138 copies/mL. A negative result does not preclude SARS-Cov-2 infection and should not be used as the sole basis for treatment or other patient management decisions. A negative result may occur with  improper specimen collection/handling, submission of specimen other than nasopharyngeal swab, presence of viral mutation(s) within the areas targeted by this assay, and inadequate number of viral  copies(<138 copies/mL). A negative result must be combined with clinical observations, patient history, and epidemiological information. The expected result is Negative.  Fact Sheet for Patients:  bloggercourse.com  Fact Sheet for Healthcare Providers:  seriousbroker.it  This test is no t yet approved or cleared by the [place]  FDA and  has been authorized for detection and/or diagnosis of SARS-CoV-2 by FDA under an Emergency Use Authorization (EUA). This EUA will remain  in effect (meaning this test can be used) for the duration of the COVID-19 declaration under Section 564(b)(1) of the Act, 21 U.S.C.section 360bbb-3(b)(1), unless the authorization is terminated  or revoked sooner.       Influenza A by PCR NEGATIVE NEGATIVE Final    Influenza B by PCR NEGATIVE NEGATIVE Final    Comment: (NOTE) The Xpert Xpress SARS-CoV-2/FLU/RSV plus assay is intended as an aid in the diagnosis of influenza from Nasopharyngeal swab specimens and should not be used as a sole basis for treatment. Nasal washings and aspirates are unacceptable for Xpert Xpress SARS-CoV-2/FLU/RSV testing.  Fact Sheet for Patients: bloggercourse.com  Fact Sheet for Healthcare Providers: seriousbroker.it  This test is not yet approved or cleared by the [place]  FDA and has been authorized for detection and/or diagnosis of SARS-CoV-2 by FDA under an Emergency Use Authorization (EUA). This EUA will remain in effect (meaning this test can be used) for the duration of the COVID-19 declaration under Section 564(b)(1) of the Act, 21 U.S.C. section 360bbb-3(b)(1), unless the authorization is terminated or revoked.     Resp Syncytial Virus by PCR NEGATIVE NEGATIVE Final    Comment: (NOTE) Fact Sheet for Patients: bloggercourse.com  Fact Sheet for Healthcare Providers: seriousbroker.it  This test is not yet approved or cleared by the [place]  FDA and has been authorized for detection and/or diagnosis of SARS-CoV-2 by FDA under an Emergency Use Authorization (EUA). This EUA will remain in effect (meaning this test can be used) for the duration of the COVID-19 declaration under Section 564(b)(1) of the Act, 21 U.S.C. section 360bbb-3(b)(1), unless the authorization is terminated or revoked.  Performed at [facility], [address], [place], [place] [address]   Blood culture (routine x 2)     Status: None (Preliminary result)   Collection Time: 08/16/24  5:53 PM   Specimen: BLOOD RIGHT HAND  Result Value Ref Range Status   Specimen Description BLOOD RIGHT HAND  Final   Special Requests AEROBIC BOTTLE ONLY Blood Culture adequate volume  Final    Culture   Final    NO GROWTH 2 DAYS Performed at [facility], [address], [place], [place] [address]    Report Status PENDING  Incomplete  Blood culture (routine x 2)     Status: None (Preliminary result)   Collection Time: 08/16/24  6:00 PM   Specimen: Left Antecubital; Blood  Result Value Ref Range Status   Specimen Description LEFT ANTECUBITAL  Final   Special Requests   Final    BOTTLES DRAWN AEROBIC AND ANAEROBIC Blood Culture adequate volume   Culture   Final    NO GROWTH 2 DAYS Performed at [facility], [address], [place], [place] [address]    Report Status PENDING  Incomplete         Radiology Studies: DG Chest 2 View Result Date: 08/16/2024 CLINICAL DATA:  Cough and shortness of breath. EXAM: CHEST - 2 VIEW COMPARISON:  Radiograph and CT 3 days ago 08/13/2024 FINDINGS: Stable heart size and mediastinal contours. Prominent epicardial fat  pad is well as epicardial fat on recent CT. Increased peribronchial thickening and interstitial opacities in the left lung. Mild scarring in the right upper lobe. No pneumothorax or large pleural effusion. Limited assessment of the osseous structures. IMPRESSION: Increased peribronchial thickening and interstitial opacities in the left lung, suspicious for bronchopneumonia. Electronically Signed   By: Andrea Gasman M.D.   On: 08/16/2024 17:07        Scheduled Meds:  apixaban   5 mg Oral BID   busPIRone   15 mg Oral TID   calcitonin (salmon)  1 spray Alternating Nares Daily   doxycycline   100 mg Oral Q12H   hydrocortisone  sod succinate (SOLU-CORTEF ) inj  40 mg Intravenous Q8H   levothyroxine   50 mcg Oral QAC breakfast   lidocaine   1 patch Transdermal Q24H   methocarbamol   750 mg Oral Q8H   midodrine   5 mg Oral BID WC   pantoprazole   40 mg Oral BID   revefenacin   175 mcg Nebulization Daily   topiramate   50 mg Oral Q lunch   torsemide   40 mg Oral Daily   Continuous Infusions:  cefTRIAXone  (ROCEPHIN )  IV 2 g (08/18/24  1648)          Derryl Duval, MD [facility] 08/18/2024, 4:50 PM   "

## 2024-08-19 DIAGNOSIS — S22060A Wedge compression fracture of T7-T8 vertebra, initial encounter for closed fracture: Secondary | ICD-10-CM | POA: Diagnosis not present

## 2024-08-19 DIAGNOSIS — R52 Pain, unspecified: Secondary | ICD-10-CM

## 2024-08-19 DIAGNOSIS — J9611 Chronic respiratory failure with hypoxia: Secondary | ICD-10-CM | POA: Diagnosis not present

## 2024-08-19 DIAGNOSIS — J189 Pneumonia, unspecified organism: Secondary | ICD-10-CM | POA: Diagnosis not present

## 2024-08-19 MED ORDER — HYDROMORPHONE HCL 1 MG/ML IJ SOLN
0.5000 mg | INTRAMUSCULAR | Status: DC | PRN
  Administered 2024-08-19 – 2024-08-23 (×19): 0.5 mg via INTRAVENOUS
  Filled 2024-08-19 (×21): qty 0.5

## 2024-08-19 MED ORDER — MELATONIN 5 MG PO TABS
5.0000 mg | ORAL_TABLET | Freq: Once | ORAL | Status: AC
  Administered 2024-08-19: 5 mg via ORAL
  Filled 2024-08-19: qty 1

## 2024-08-19 NOTE — Consult Note (Signed)
 "  Providing Compassionate, Quality Care - Together  Neurosurgery Consult  Referring physician: Sigdel, MD Reason for referral: thoracic fx   History of Present Illness: Pt w/ PMH pertinent for ILD, sarcoidosis, Addisons dx, CHF, Afib on Eliquis  presented to [facility] w/ c/o chest pain. Workup revealing T8 compression fx for which NSGY consulted. She does c/o severe midback pain w/ radiation to under L breast. No recent falls, new BUE/BLE symptoms. States she sees Dr. Burnetta as an outpatient for spine issues.  Objective: BP (!) 98/35 (BP Location: Left Arm)   Pulse 72   Temp 97.6 F (36.4 C) (Oral)   Resp 18   Ht 5' 10 (1.778 m)   Wt 108.9 kg   LMP 05/17/2016   SpO2 97%   BMI 34.44 kg/m   MR THORACIC SPINE W WO CONTRAST Result Date: 08/19/2024 EXAM: MRI Thoracic and Lumbar Spine With and Without Intravenous Contrast 08/18/2024 08:52:00 PM TECHNIQUE: Multiplanar multisequence MRI of the thoracic and lumbar spine was performed with and without the administration of intravenous contrast. COMPARISON: 07/30/24 CT thoracic and lumbar spine. CLINICAL HISTORY: Mid-back pain, prior compression fracture FINDINGS: BONES AND ALIGNMENT: Normal alignment. Acute/recent or unhealed T8 compression fracture with 30% height loss, marrow edema, and intense enhancement. Fracture involves the pedicles bilaterally. Acute/recent or unhealed L1 and L5 superior endplate fractures with approximately 40% height loss. Associated marrow edema and enhancement. Remote T11, L3 and L4 compression fractures with approximately 20-30% height loss and no marrow edema. SPINAL CORD: Normal spinal cord size. Normal spinal cord signal. SOFT TISSUES: Unremarkable. THORACIC AND LUMBAR DISC LEVELS: No significant disc herniation. No spinal canal stenosis or neural foraminal narrowing. IMPRESSION: 1. In comparison to 12/4, new/interval acute T8 compression fracture with involvement of the pedicles bilaterally and 30% height loss. 2. Unhealed  L1 and L5 superior endplate fractures with similar height loss and marrow edema. 3. Remote T11, L3 and L4 compression fractures. 4. No significant canal or foraminal stenosis in the thoracic or lumbar spine. Electronically signed by: Gilmore Molt 08/19/2024 03:35 AM EST RP Workstation: [id number]   MR Lumbar Spine W Wo Contrast Result Date: 08/19/2024 EXAM: MRI Thoracic and Lumbar Spine With and Without Intravenous Contrast 08/18/2024 08:52:00 PM TECHNIQUE: Multiplanar multisequence MRI of the thoracic and lumbar spine was performed with and without the administration of intravenous contrast. COMPARISON: 07/30/24 CT thoracic and lumbar spine. CLINICAL HISTORY: Mid-back pain, prior compression fracture FINDINGS: BONES AND ALIGNMENT: Normal alignment. Acute/recent or unhealed T8 compression fracture with 30% height loss, marrow edema, and intense enhancement. Fracture involves the pedicles bilaterally. Acute/recent or unhealed L1 and L5 superior endplate fractures with approximately 40% height loss. Associated marrow edema and enhancement. Remote T11, L3 and L4 compression fractures with approximately 20-30% height loss and no marrow edema. SPINAL CORD: Normal spinal cord size. Normal spinal cord signal. SOFT TISSUES: Unremarkable. THORACIC AND LUMBAR DISC LEVELS: No significant disc herniation. No spinal canal stenosis or neural foraminal narrowing. IMPRESSION: 1. In comparison to 12/4, new/interval acute T8 compression fracture with involvement of the pedicles bilaterally and 30% height loss. 2. Unhealed L1 and L5 superior endplate fractures with similar height loss and marrow edema. 3. Remote T11, L3 and L4 compression fractures. 4. No significant canal or foraminal stenosis in the thoracic or lumbar spine. Electronically signed by: Gilmore Molt 08/19/2024 03:35 AM EST RP Workstation: [id number]   Awake, alert, oriented Speech fluent, appropriate Sensation grossly intact Strength diffusely 4/5  BUE/BLE    Impression/Assessment:  This is a 63 yo  female with chronic steroid use with  -T8 compression fracture without significant spinal stenosis who is currently neurologically intact.  -L1, L5 compression fractures  Plan:  -TLSO when OOB -Therapies as tolerated -F/u w/ her outpatient spine office upon dc.     Eldred Sooy CAYLIN Berish Bohman, PA-C   "

## 2024-08-19 NOTE — Plan of Care (Signed)
  Problem: Education: Goal: Knowledge of General Education information will improve Description: Including pain rating scale, medication(s)/side effects and non-pharmacologic comfort measures Outcome: Progressing   Problem: Health Behavior/Discharge Planning: Goal: Ability to manage health-related needs will improve Outcome: Progressing   Problem: Clinical Measurements: Goal: Diagnostic test results will improve Outcome: Progressing   Problem: Nutrition: Goal: Adequate nutrition will be maintained Outcome: Progressing   Problem: Coping: Goal: Level of anxiety will decrease Outcome: Progressing   

## 2024-08-19 NOTE — Evaluation (Signed)
 Physical Therapy Evaluation Patient Details Name: Kathleen [name] MRN: [id number] DOB: [birth year] Today's Date: 08/19/2024  History of Present Illness  63 yo F adm 12/21 findings for L lung PNA 12/23 reports CP with L arm pain. Recent visit to ED 12/18 for CP, cough and difficulty breathing 12/23 MRI (+) acute T8 compression fx with involvement of pedicles bil, unhealed L1 L5 fx, remote T11 L3 and L4 compression fx, PMH: chronic hypoxic RF (5L O2), ILD, sarcoidosis, Addison's disease, morbid obesity, bilateral hip AVN, osteoporosis with multiple vertebral fractures on chronic narcotics, and admissions for intractable pain d/t compression fractures of L4 (4/12-4/16) and then L3 (5/9-5/14)   Clinical Impression  Patient presents with dependencies in mobility due to pain, generalized weakness, and decreased balance.  Patient limited today as unable to don brace.  Patient has been limited in her mobility for past several weeks and I do not believe is far from her baseline.  Feel that if patient can get OOB and transfer to chair, will be OK to d/c home with husband.  PT will follow while in hospital for progressive mobility. **Of note, secure chatted with Camie Pickle, [facility] regarding unable to don brace.  She approved to use LSO brace patient currently has and limit mobility to bed to/from chair until correct sized brace arrives on Monday.  If patient discharges before Mon - husband would need to pick up brace.          If plan is discharge home, recommend the following: A little help with walking and/or transfers;A little help with bathing/dressing/bathroom;Assistance with cooking/housework;Assist for transportation;Help with stairs or ramp for entrance   Can travel by private vehicle        Equipment Recommendations None recommended by PT  Recommendations for Other Services       Functional Status Assessment Patient has had a recent decline in their functional status and demonstrates the  ability to make significant improvements in function in a reasonable and predictable amount of time.     Precautions / Restrictions Precautions Precautions: Fall;Back Precaution/Restrictions Comments: on 5L at baseline Required Braces or Orthoses: Spinal Brace;Other Brace Spinal Brace: Thoracolumbosacral orthotic Other Brace: [employer] tried to deliver TLSO today, it was too small and a larger will have to be ordered (likely will not arrive until Monday).  Camie Pickle, PA approved to wear LSO and limit mobility to bed to/from chair until TLSO arrives. Restrictions Weight Bearing Restrictions Per Provider Order: No Other Position/Activity Restrictions: limit to bed to/from chair only      Mobility  Bed Mobility Overal bed mobility: Needs Assistance Bed Mobility: Rolling, Sidelying to Sit, Sit to Sidelying Rolling: Contact guard assist, Used rails Sidelying to sit: Mod assist, Used rails     Sit to sidelying: Min assist      Transfers Overall transfer level: Needs assistance Equipment used: Rolling walker (2 wheels) Transfers: Sit to/from Stand Sit to Stand: Min assist, +2 physical assistance, From elevated surface           General transfer comment: attempted to stand to see if she could don brace in standing.    Ambulation/Gait               General Gait Details: did not attempt ambulation as brace did not fit.  Patient did side-step 2-3 steps to get better positioned before sitting down.  Stairs            Wheelchair Mobility     Tilt Bed  Modified Rankin (Stroke Patients Only)       Balance Overall balance assessment: Needs assistance Sitting-balance support: No upper extremity supported, Feet supported Sitting balance-Leahy Scale: Fair     Standing balance support: Bilateral upper extremity supported, Reliant on assistive device for balance, During functional activity Standing balance-Leahy Scale: Poor                                Pertinent Vitals/Pain Pain Assessment Pain Score: 10-Worst pain ever Pain Location: back Pain Descriptors / Indicators: Sharp Pain Intervention(s): Limited activity within patient's tolerance, Monitored during session, Repositioned, RN gave pain meds during session    Home Living Family/patient expects to be discharged to:: Private residence Living Arrangements: Spouse/significant other Available Help at Discharge: Family;Available PRN/intermittently Type of Home: House Home Access: Ramped entrance (back of house) Entrance Stairs-Rails: None     Home Layout: One level Home Equipment: Agricultural Consultant (2 wheels);Hand held shower head;BSC/3in1;Wheelchair - manual;Tub bench;Lift chair;Hospital bed;Adaptive equipment      Prior Function Prior Level of Function : Needs assist  Cognitive Assist : ADLs (cognitive)     Physical Assist : Mobility (physical);ADLs (physical) Mobility (physical): Transfers;Gait;Bed mobility ADLs (physical): Bathing;Dressing;Toileting;IADLs Mobility Comments: rior to 2 weeks ago, pt was ambulating with a RW, since increse in pain, pt has mostly been in bed but able to get to the [facility] with assist of her husband ADLs Comments: Husband typically helps with ADL tasks, at times has to in/out cath her due to residual affects from Shingles. Husband has been doing 75% of her ADL for the past 2 weeks due to pain     Extremity/Trunk Assessment   Upper Extremity Assessment Upper Extremity Assessment: Defer to OT evaluation    Lower Extremity Assessment Lower Extremity Assessment: Generalized weakness    Cervical / Trunk Assessment Cervical / Trunk Assessment: Other exceptions  Communication   Communication Communication: No apparent difficulties    Cognition Arousal: Alert Behavior During Therapy: WFL for tasks assessed/performed   PT - Cognitive impairments: No apparent impairments                         Following commands: Intact        Cueing Cueing Techniques: Verbal cues     General Comments      Exercises     Assessment/Plan    PT Assessment Patient needs continued PT services  PT Problem List Decreased activity tolerance;Decreased balance;Decreased mobility;Decreased knowledge of precautions;Decreased skin integrity       PT Treatment Interventions DME instruction;Gait training;Stair training;Functional mobility training;Therapeutic activities;Therapeutic exercise;Balance training;Patient/family education    PT Goals (Current goals can be found in the Care Plan section)  Acute Rehab PT Goals Patient Stated Goal: go home today PT Goal Formulation: With patient/family Time For Goal Achievement: 08/26/24 Potential to Achieve Goals: Good    Frequency Min 4X/week     Co-evaluation               AM-PAC PT 6 Clicks Mobility  Outcome Measure Help needed turning from your back to your side while in a flat bed without using bedrails?: A Little Help needed moving from lying on your back to sitting on the side of a flat bed without using bedrails?: A Little Help needed moving to and from a bed to a chair (including a wheelchair)?: A Lot Help needed standing up from a chair using your arms (  e.g., wheelchair or bedside chair)?: A Lot Help needed to walk in hospital room?: Total Help needed climbing 3-5 steps with a railing? : Total 6 Click Score: 9    End of Session Equipment Utilized During Treatment: Oxygen  Activity Tolerance: Patient limited by pain;Other (comment) (mobility limited due to unable to don brace) Patient left: in bed;with call bell/phone within reach;with family/visitor present;with bed alarm set Nurse Communication:  (RN in during session) PT Visit Diagnosis: Unsteadiness on feet (R26.81);Other abnormalities of gait and mobility (R26.89);Difficulty in walking, not elsewhere classified (R26.2);Pain    Time: 1530-1600 PT Time Calculation (min) (ACUTE ONLY): 30 min   Charges:    PT Evaluation $PT Eval Moderate Complexity: 1 Mod   PT General Charges $$ ACUTE PT VISIT: 1 Visit         08/19/2024 Lebron, PT Acute Rehabilitation Services Office:  [phone]   Katharina Venus HERO 08/19/2024, 4:15 PM

## 2024-08-19 NOTE — Progress Notes (Signed)
 Orthopedic Tech Progress Note Patient Details:  Kathleen [name] 01-24-1961 [id number]  Spoke with PA TOMLINSON, and she wants a TLSO VERTALIGN. Called in order to [facility]   Patient ID: Kathleen [name], female   DOB: July 19, 1961, 63 y.o.   MRN: [id number]  Kathleen [name] 08/19/2024, 1:07 PM

## 2024-08-19 NOTE — Evaluation (Signed)
 Occupational Therapy Evaluation Patient Details Name: Kathleen [name] MRN: [id number] DOB: 04-26-1961 Today's Date: 08/19/2024   History of Present Illness   63 yo F adm 12/21 findings for L lung PNA 12/23 reports CP with L arm pain. Recent visit to ED 12/18 for CP, cough and difficulty breathing 12/23 MRI (+) acute T8 compression fx with involvement of pedicles bil, unhealed L1 L5 fx, remote T11 L3 and L4 compression fx, PMH: chronic hypoxic RF (5L O2), ILD, sarcoidosis, Addison's disease, morbid obesity, bilateral hip AVN, osteoporosis with multiple vertebral fractures on chronic narcotics, and admissions for intractable pain d/t compression fractures of L4 (4/12-4/16) and then L3 (5/9-5/14)     Clinical Impressions PTA pt lives with her husband who assists with ADL tasks at baseline. Pt is typically able to ambulate short distances using her RW and does as much of her self care that she can however over the last 2-3 weeks, pt has mostly been in bed  and using the [facility] with the assistance of her husband due to back pain. Pt anxious about mobility however willing to participate with OT as she is wanting to DC home today/tomorrow. Pt seen in conjunction with [employer] to fit her TLSO, however brace was too small. Pt only required min A to mobilize to EOB. Discussed with PT who will plan to see this pm after larger TLSO arrives to attempt mobility. Pt will need to be able to take a few steps to DC home with husband at wc level (they have a ramped entrance). Recommend HHOT and HH Aide (previously active with [facility]). Acute OT will follow to facilitate safe DC home.      If plan is discharge home, recommend the following:   A little help with walking and/or transfers;A lot of help with bathing/dressing/bathroom;Assistance with cooking/housework;Assist for transportation;Help with stairs or ramp for entrance     Functional Status Assessment   Patient has had a recent decline in their functional  status and demonstrates the ability to make significant improvements in function in a reasonable and predictable amount of time.     Equipment Recommendations   None recommended by OT     Recommendations for Other Services   PT consult     Precautions/Restrictions   Precautions Precautions: Fall;Back Precaution/Restrictions Comments: on 5L at baseline Required Braces or Orthoses: Spinal Brace Spinal Brace: Thoracolumbosacral orthotic (when OOB)     Mobility Bed Mobility Overal bed mobility: Needs Assistance Bed Mobility: Rolling, Sidelying to Sit Rolling: Contact guard assist, Used rails Sidelying to sit: Min assist, Used rails       General bed mobility comments: bed position slowly changed with pt input; With cues for log rolling and using R LE to push, able to trnasition from sidelying to sit with min A    Transfers                   General transfer comment: unable due to brace not fitting      Balance Overall balance assessment: Needs assistance   Sitting balance-Leahy Scale: Fair                                     ADL either performed or assessed with clinical judgement   ADL Overall ADL's : Needs assistance/impaired Eating/Feeding: Modified independent   Grooming: Set up;Bed level   Upper Body Bathing: Minimal assistance;Bed level   Lower Body Bathing:  Maximal assistance;Bed level   Upper Body Dressing : Moderate assistance   Lower Body Dressing: Total assistance;Bed level               Functional mobility during ADLs:  (brace not fitting; min A to mobilize to EOB; did not stand due to brace not appropriate)       Vision         Perception         Praxis         Pertinent Vitals/Pain Pain Assessment Pain Assessment: 0-10 Pain Score: 10-Worst pain ever Pain Location: back Pain Descriptors / Indicators: Sharp Pain Intervention(s): Limited activity within patient's tolerance, Repositioned,  Relaxation, Heat applied, Patient requesting pain meds-RN notified     Extremity/Trunk Assessment Upper Extremity Assessment Upper Extremity Assessment: Generalized weakness   Lower Extremity Assessment Lower Extremity Assessment: Defer to PT evaluation   Cervical / Trunk Assessment Cervical / Trunk Assessment: Other exceptions (multiple back fractures; incresed body habitus)   Communication     Cognition Arousal: Alert Behavior During Therapy: Anxious Cognition: No apparent impairments                               Following commands: Intact       Cueing  General Comments          Exercises     Shoulder Instructions      Home Living Family/patient expects to be discharged to:: Private residence Living Arrangements: Spouse/significant other Available Help at Discharge: Family;Available PRN/intermittently Type of Home: House Home Access: Ramped entrance (back of house)   Entrance Stairs-Rails: None Home Layout: One level     Bathroom Shower/Tub: Tub/shower unit;Curtain   Bathroom Toilet: Handicapped height Bathroom Accessibility: Yes How Accessible: Accessible via walker;Accessible via wheelchair Home Equipment: Rolling Walker (2 wheels);Hand held shower head;BSC/3in1;Wheelchair - manual;Tub bench;Lift chair;Hospital bed;Adaptive equipment Adaptive Equipment: Reacher;Other (Comment) (toilet buddy)        Prior Functioning/Environment Prior Level of Function : Needs assist  Cognitive Assist : ADLs (cognitive)     Physical Assist : Mobility (physical);ADLs (physical) Mobility (physical): Transfers;Gait;Bed mobility ADLs (physical): Bathing;Dressing;Toileting;IADLs Mobility Comments: rior to 2 weeks ago, pt was ambulating with a RW, since increse in pain, pt has mostly been in bed but able to get to the [facility] with assist of her husband ADLs Comments: Husband typically helps with ADL tasks, at times has to in/out cath her due to residual affects  from Shingles. Husband has been doing 75% of her ADL for the past 2 weeks due to pain    OT Problem List: Decreased strength;Decreased range of motion;Decreased activity tolerance;Impaired balance (sitting and/or standing);Decreased knowledge of use of DME or AE;Decreased knowledge of precautions;Cardiopulmonary status limiting activity;Obesity;Pain   OT Treatment/Interventions: Self-care/ADL training;Therapeutic exercise;DME and/or AE instruction;Therapeutic activities;Patient/family education      OT Goals(Current goals can be found in the care plan section)   Acute Rehab OT Goals Patient Stated Goal: home today or tomorrow OT Goal Formulation: With patient/family Time For Goal Achievement: 09/02/24 Potential to Achieve Goals: Good   OT Frequency:  Min 2X/week    Co-evaluation              AM-PAC OT 6 Clicks Daily Activity     Outcome Measure Help from another person eating meals?: None Help from another person taking care of personal grooming?: A Little Help from another person toileting, which includes using toliet, bedpan, or urinal?:  Total Help from another person bathing (including washing, rinsing, drying)?: A Lot Help from another person to put on and taking off regular upper body clothing?: A Lot Help from another person to put on and taking off regular lower body clothing?: Total 6 Click Score: 13   End of Session Equipment Utilized During Treatment: Oxygen  (5L) Nurse Communication: Mobility status;Patient requests pain meds  Activity Tolerance: Patient tolerated treatment well Patient left: in bed;with call bell/phone within reach;with bed alarm set;with family/visitor present  OT Visit Diagnosis: Unsteadiness on feet (R26.81);Other abnormalities of gait and mobility (R26.89);Muscle weakness (generalized) (M62.81);Pain Pain - part of body:  (back)                Time: 1420-1445 OT Time Calculation (min): 25 min Charges:  OT General Charges $OT Visit: 1  Visit OT Evaluation $OT Eval Moderate Complexity: 1 Mod OT Treatments $Self Care/Home Management : 8-22 mins  Kreg Sink, OT/L   Acute OT Clinical Specialist Acute Rehabilitation Services Pager [phone] Office [phone]   [facility] 08/19/2024, 3:05 PM

## 2024-08-19 NOTE — Progress Notes (Signed)
 " PROGRESS NOTE    Kathleen [name]  FMW:[id number] DOB: [birth year] DOA: 08/16/2024 PCP: Alphonsa Glendia LABOR, MD   Brief Narrative:  Kathleen [name] is a 63 y.o. female with medical history significant for chronic respiratory failure on 5 L, Addison's disease, CHF, hypothyroidism, ILD and OSA. Patient presented to the ED with complaints of congestion, chest pain, cough and difficulty breathing over the past several days.  She was in the ED with similar complaints 12/18, CTA chest was negative for PE, and troponins EKG were unremarkable. Chest pain is underneath her left breast, and worse with deep breathing and coughing.  She reports cough productive of greenish sputum.  No fevers.    Patient and husband also reported left mid back/low back pain ongoing for past couple of weeks.  She says she has been basically bedridden due to severity of the pain.  Was admitted at [facility] hospital for treatment of pneumonia however patient and family was adamant about transfer to [facility].   Assessment & Plan:   Principal Problem:   PNA (pneumonia) Active Problems:   Interstitial lung disease (HCC)   Chronic respiratory failure with hypoxia (HCC)   Anxiety and depression   Sarcoidosis   Fibromyalgia   Obstructive sleep apnea   Chronic pain syndrome   Hypothyroidism   Adrenal insufficiency (Addison's disease) (HCC)   Depression, major, single episode, moderate (HCC)  1-left lung pneumonia - Will continue treatment with IV ceftriaxone , azithromycin , supportive care , mucolytic's, bronchodilator therapy -Chronic respiratory failure on home oxygen  - CTA 12/18 with negative for PE/consolidation.  Chest x-ray 12/22 concerning for pneumonia  2-acute on chronic low back/mid back pain.  -Patient has history of lumbar compression vertebral fractures in the past.  Per husband, she has been basically bedridden for the past 2 to 3 weeks due to severity of pain. -MRI of thoracolumbar spine obtained 12/23 shows T8  compression fracture which is new.  Chronic L1/L2 compression fracture without severe cervical stenosis. Neurosurgery was consulted today who recommended TLSO brace and outpatient follow-up. -Will continue pain management.  Unfortunately it is not controlled. - Will continue Robaxin  as needed, Norco, lidocaine  patch etc. lidocaine  ordered. - PT/OT as tolerated.  2-Addison's disease - Solu-Cortef  was increased to 100 mg 3 times daily 12/23 due to low blood pressure.  Midodrine  was increased.  Will taper down solucortefCortef. - Continue midodrine  and nasal calcitonin - Continue following electrolytes stability and maintain adequate hydration.   3-chronic diastolic heart failure - Stable and compensated - Continue home dose torsemide .   4-prolonged QT - Magnesium  within normal limits - Follow potassium and replete as needed - Continue minimizing agents that can further prolong QT.   5-chronic respiratory failure, interstitial lung disease and history of sarcoidosis/obstructive sleep apnea - Patient is chronically on 5 L supplementation - Continue to maintain oxygen  saturation above 90% - Continue bronchodilator management.     7-depression/anxiety - Continue BuSpar  - No suicidal ideation or hallucination.   8-hypothyroidism - Continue Synthroid .   DVT prophylaxis: Eliquis  Code Status: Full code. Family Communication: none Disposition:    Status is: Inpatient Remains inpatient appropriate because: Continue IV therapy.   Subjective:  Patient laying in bed on her right side.  Continues to report of left mid back pain radiating to left subcostal area.  Objective: Vitals:   08/19/24 0513 08/19/24 0901 08/19/24 0933 08/19/24 1205  BP: (!) 95/51  (!) 98/35 (!) 101/45  Pulse: 73  72 67  Resp: 15  18  Temp: 97.8 F (36.6 C)  97.6 F (36.4 C)   TempSrc: Oral  Oral   SpO2: 99% 97% 97%   Weight:      Height:        Intake/Output Summary (Last 24 hours) at 08/19/2024  1231 Last data filed at 08/19/2024 0535 Gross per 24 hour  Intake 596 ml  Output --  Net 596 ml   Filed Weights   08/16/24 1619  Weight: 108.9 kg    Examination:  General: Patient appears to be in distress due to pain HEENT: Moist oral mucosa Chest: Clear to auscultation bilaterally, diminished bilaterally, no wheezes CVs: S1, S2, no murmur, regular rhythm Abdomen: Soft, nontender to palpation MSK: Significant pain even with slightest touch on the left lumbar area/underneath left rib cage/thoracolumbar spinal area.   Data Reviewed: I have personally reviewed following labs and imaging studies  CBC: Recent Labs  Lab 08/13/24 0238 08/16/24 1642 08/17/24 0458  WBC 11.7* 10.7* 11.0*  NEUTROABS 8.6*  --   --   HGB 10.0* 9.3* 9.4*  HCT 33.4* 30.3* 30.4*  MCV 90.5 88.6 88.4  PLT 439* 423* 420*   Basic Metabolic Panel: Recent Labs  Lab 08/13/24 0238 08/16/24 1642 08/17/24 0458  NA 140 139 141  K 3.7 3.5 3.8  CL 95* 94* 98  CO2 40* 31 38*  GLUCOSE 131* 111* 106*  BUN 9 9 8   CREATININE 0.87 0.89 0.65  CALCIUM  9.9 9.8 9.8  MG  --  2.4  --    GFR: Estimated Creatinine Clearance: 98.7 mL/min (by C-G formula based on SCr of 0.65 mg/dL). Liver Function the left f: Recent Labs  Lab 08/16/24 1642  AST 19  ALT 12  ALKPHOS 93  BILITOT 0.3  PROT 6.9  ALBUMIN 3.8   No results for input(s): LIPASE, AMYLASE in the last 168 hours. No results for input(s): AMMONIA  in the last 168 hours. Coagulation Profile: No results for input(s): INR, PROTIME in the last 168 hours. Cardiac Enzymes: No results for input(s): CKTOTAL, CKMB, CKMBINDEX, TROPONINI in the last 168 hours. BNP (last 3 results) Recent Labs    05/30/24 2251 08/16/24 1642  PROBNP 192.0 87.8   HbA1C: No results for input(s): HGBA1C in the last 72 hours. CBG: Recent Labs  Lab 08/18/24 0250  GLUCAP 161*   Lipid Profile: No results for input(s): CHOL, HDL, LDLCALC, TRIG,  CHOLHDL, LDLDIRECT in the last 72 hours. Thyroid  Function Tests: No results for input(s): TSH, T4TOTAL, FREET4, T3FREE, THYROIDAB in the last 72 hours. Anemia Panel: No results for input(s): VITAMINB12, FOLATE, FERRITIN, TIBC, IRON , RETICCTPCT in the last 72 hours. Sepsis Labs: Recent Labs  Lab 08/16/24 1800  LATICACIDVEN 1.3    Recent Results (from the past 240 hours)  Resp panel by RT-PCR (RSV, Flu A&B, Covid) Anterior Nasal Swab     Status: None   Collection Time: 08/16/24  4:27 PM   Specimen: Anterior Nasal Swab  Result Value Ref Range Status   SARS Coronavirus 2 by RT PCR NEGATIVE NEGATIVE Final    Comment: (NOTE) SARS-CoV-2 target nucleic acids are NOT DETECTED.  The SARS-CoV-2 RNA is generally detectable in upper respiratory specimens during the acute phase of infection. The lowest concentration of SARS-CoV-2 viral copies this assay can detect is 138 copies/mL. A negative result does not preclude SARS-Cov-2 infection and should not be used as the sole basis for treatment or other patient management decisions. A negative result may occur with  improper specimen collection/handling, submission of  specimen other than nasopharyngeal swab, presence of viral mutation(s) within the areas targeted by this assay, and inadequate number of viral copies(<138 copies/mL). A negative result must be combined with clinical observations, patient history, and epidemiological information. The expected result is Negative.  Fact Sheet for Patients:  bloggercourse.com  Fact Sheet for Healthcare Providers:  seriousbroker.it  This test is no t yet approved or cleared by the [place]  FDA and  has been authorized for detection and/or diagnosis of SARS-CoV-2 by FDA under an Emergency Use Authorization (EUA). This EUA will remain  in effect (meaning this test can be used) for the duration of the COVID-19 declaration  under Section 564(b)(1) of the Act, 21 U.S.C.section 360bbb-3(b)(1), unless the authorization is terminated  or revoked sooner.       Influenza A by PCR NEGATIVE NEGATIVE Final   Influenza B by PCR NEGATIVE NEGATIVE Final    Comment: (NOTE) The Xpert Xpress SARS-CoV-2/FLU/RSV plus assay is intended as an aid in the diagnosis of influenza from Nasopharyngeal swab specimens and should not be used as a sole basis for treatment. Nasal washings and aspirates are unacceptable for Xpert Xpress SARS-CoV-2/FLU/RSV testing.  Fact Sheet for Patients: bloggercourse.com  Fact Sheet for Healthcare Providers: seriousbroker.it  This test is not yet approved or cleared by the [place]  FDA and has been authorized for detection and/or diagnosis of SARS-CoV-2 by FDA under an Emergency Use Authorization (EUA). This EUA will remain in effect (meaning this test can be used) for the duration of the COVID-19 declaration under Section 564(b)(1) of the Act, 21 U.S.C. section 360bbb-3(b)(1), unless the authorization is terminated or revoked.     Resp Syncytial Virus by PCR NEGATIVE NEGATIVE Final    Comment: (NOTE) Fact Sheet for Patients: bloggercourse.com  Fact Sheet for Healthcare Providers: seriousbroker.it  This test is not yet approved or cleared by the [place]  FDA and has been authorized for detection and/or diagnosis of SARS-CoV-2 by FDA under an Emergency Use Authorization (EUA). This EUA will remain in effect (meaning this test can be used) for the duration of the COVID-19 declaration under Section 564(b)(1) of the Act, 21 U.S.C. section 360bbb-3(b)(1), unless the authorization is terminated or revoked.  Performed at [facility], [address], [place], [place] [address]   Blood culture (routine x 2)     Status: None (Preliminary result)   Collection Time: 08/16/24  5:53 PM    Specimen: BLOOD RIGHT HAND  Result Value Ref Range Status   Specimen Description BLOOD RIGHT HAND  Final   Special Requests AEROBIC BOTTLE ONLY Blood Culture adequate volume  Final   Culture   Final    NO GROWTH 2 DAYS Performed at [facility], [address] [place], [place] [address]    Report Status PENDING  Incomplete  Blood culture (routine x 2)     Status: None (Preliminary result)   Collection Time: 08/16/24  6:00 PM   Specimen: Left Antecubital; Blood  Result Value Ref Range Status   Specimen Description LEFT ANTECUBITAL  Final   Special Requests   Final    BOTTLES DRAWN AEROBIC AND ANAEROBIC Blood Culture adequate volume   Culture   Final    NO GROWTH 2 DAYS Performed at [facility], [address] [place], [place] [address]    Report Status PENDING  Incomplete         Radiology Studies: MR THORACIC SPINE W WO CONTRAST Result Date: 08/19/2024 EXAM: MRI Thoracic and Lumbar Spine With and Without  Intravenous Contrast 08/18/2024 08:52:00 PM TECHNIQUE: Multiplanar multisequence MRI of the thoracic and lumbar spine was performed with and without the administration of intravenous contrast. COMPARISON: 07/30/24 CT thoracic and lumbar spine. CLINICAL HISTORY: Mid-back pain, prior compression fracture FINDINGS: BONES AND ALIGNMENT: Normal alignment. Acute/recent or unhealed T8 compression fracture with 30% height loss, marrow edema, and intense enhancement. Fracture involves the pedicles bilaterally. Acute/recent or unhealed L1 and L5 superior endplate fractures with approximately 40% height loss. Associated marrow edema and enhancement. Remote T11, L3 and L4 compression fractures with approximately 20-30% height loss and no marrow edema. SPINAL CORD: Normal spinal cord size. Normal spinal cord signal. SOFT TISSUES: Unremarkable. THORACIC AND LUMBAR DISC LEVELS: No significant disc herniation. No spinal canal stenosis or neural foraminal narrowing. IMPRESSION: 1. In comparison to 12/4,  new/interval acute T8 compression fracture with involvement of the pedicles bilaterally and 30% height loss. 2. Unhealed L1 and L5 superior endplate fractures with similar height loss and marrow edema. 3. Remote T11, L3 and L4 compression fractures. 4. No significant canal or foraminal stenosis in the thoracic or lumbar spine. Electronically signed by: Gilmore Molt 08/19/2024 03:35 AM EST RP Workstation: [id number]   MR Lumbar Spine W Wo Contrast Result Date: 08/19/2024 EXAM: MRI Thoracic and Lumbar Spine With and Without Intravenous Contrast 08/18/2024 08:52:00 PM TECHNIQUE: Multiplanar multisequence MRI of the thoracic and lumbar spine was performed with and without the administration of intravenous contrast. COMPARISON: 07/30/24 CT thoracic and lumbar spine. CLINICAL HISTORY: Mid-back pain, prior compression fracture FINDINGS: BONES AND ALIGNMENT: Normal alignment. Acute/recent or unhealed T8 compression fracture with 30% height loss, marrow edema, and intense enhancement. Fracture involves the pedicles bilaterally. Acute/recent or unhealed L1 and L5 superior endplate fractures with approximately 40% height loss. Associated marrow edema and enhancement. Remote T11, L3 and L4 compression fractures with approximately 20-30% height loss and no marrow edema. SPINAL CORD: Normal spinal cord size. Normal spinal cord signal. SOFT TISSUES: Unremarkable. THORACIC AND LUMBAR DISC LEVELS: No significant disc herniation. No spinal canal stenosis or neural foraminal narrowing. IMPRESSION: 1. In comparison to 12/4, new/interval acute T8 compression fracture with involvement of the pedicles bilaterally and 30% height loss. 2. Unhealed L1 and L5 superior endplate fractures with similar height loss and marrow edema. 3. Remote T11, L3 and L4 compression fractures. 4. No significant canal or foraminal stenosis in the thoracic or lumbar spine. Electronically signed by: Gilmore Molt 08/19/2024 03:35 AM EST RP Workstation:  [id number]        Scheduled Meds:  apixaban   5 mg Oral BID   busPIRone   15 mg Oral TID   calcitonin (salmon)  1 spray Alternating Nares Daily   doxycycline   100 mg Oral Q12H   hydrocortisone  sod succinate (SOLU-CORTEF ) inj  100 mg Intravenous Q8H   levothyroxine   50 mcg Oral QAC breakfast   lidocaine   1 patch Transdermal Q24H   methocarbamol   750 mg Oral Q8H   midodrine   10 mg Oral TID WC   pantoprazole   40 mg Oral BID   revefenacin   175 mcg Nebulization Daily   topiramate   50 mg Oral Q lunch   torsemide   40 mg Oral Daily   Continuous Infusions:  cefTRIAXone  (ROCEPHIN )  IV 2 g (08/18/24 1648)          Derryl Duval, MD [facility] 08/19/2024, 12:31 PM   "

## 2024-08-19 NOTE — Progress Notes (Signed)
 Orthopedic Tech Progress Note Patient Details:  [name] 1960-10-02 [id number]  Came to fit patient with TLSO BRACE per order, patient refused stating whoever her KEMP SHOPE is said our hard back braces would do her more harm then good I reached out to the PA about what patient stated. RN is aware    Patient ID: [name], female   DOB: April 17, 1961, 63 y.o.   MRN: [id number]  Delanna LITTIE Pac 08/19/2024, 12:17 PM

## 2024-08-19 NOTE — Progress Notes (Signed)
 Orthopedic Tech Progress Note Patient Details:  Kathleen [name] [birth year] [id number]  [employer] came out to fit patient with back brace but everything she had was to small. [employer] warehouse is closed for the holidays so I'm not sure when brace will arrive.  Patient ID: Kathleen [name], female   DOB: 09-May-1961, 63 y.o.   MRN: [id number]  Delanna LITTIE Pac 08/19/2024, 3:36 PM

## 2024-08-20 ENCOUNTER — Inpatient Hospital Stay (HOSPITAL_COMMUNITY)

## 2024-08-20 DIAGNOSIS — J189 Pneumonia, unspecified organism: Secondary | ICD-10-CM | POA: Diagnosis not present

## 2024-08-20 DIAGNOSIS — S22060A Wedge compression fracture of T7-T8 vertebra, initial encounter for closed fracture: Secondary | ICD-10-CM | POA: Diagnosis not present

## 2024-08-20 DIAGNOSIS — E271 Primary adrenocortical insufficiency: Secondary | ICD-10-CM | POA: Diagnosis not present

## 2024-08-20 MED ORDER — HYDROCORTISONE SOD SUC (PF) 100 MG IJ SOLR
50.0000 mg | Freq: Three times a day (TID) | INTRAMUSCULAR | Status: DC
  Administered 2024-08-20 – 2024-08-21 (×3): 50 mg via INTRAVENOUS
  Filled 2024-08-20 (×7): qty 1

## 2024-08-20 MED ORDER — MIDODRINE HCL 5 MG PO TABS
5.0000 mg | ORAL_TABLET | Freq: Once | ORAL | Status: AC
  Administered 2024-08-20: 5 mg via ORAL
  Filled 2024-08-20: qty 1

## 2024-08-20 NOTE — Progress Notes (Signed)
 OT Cancellation Note  Patient Details Name: Kathleen [name] MRN: [id number] DOB: 1960/10/11   Cancelled Treatment:    Reason Eval/Treat Not Completed: Other (comment) (Patient willing to participate with OT but no LSO brace present.  Will attempt to have one delivered today.  OT to reattempt following brace delivery.)  [name] 08/20/2024, 10:02 AM  [name], OTA Acute Rehabilitation Services  Office [phone]

## 2024-08-20 NOTE — Progress Notes (Signed)
 Orthopedic Tech Progress Note Patient Details:  Kathleen [name] 22-Jan-1961 [id number]  Patient ID: Kathleen [name], female   DOB: [birth year], 63 y.o.   MRN: [id number] Reached out to ordering provider, OT and PT in regards to new brace order. Originally a TLSO was ordered, but did not fit and [employer] is aware of the need for a larger TLSO, but the [employer] office is closed today for Christmas. Ortho tech on 12/24 attempted to apply TLSO we have in stock, but patient refused stating her ortho doctor said the brace will do her more harm than good because it is too hard. Today a new order for a brace has been put in, but now it is an LSO, so it is unclear on what brace this patient actually needs. Awaiting response. Kathleen [name] 08/20/2024, 10:56 AM

## 2024-08-20 NOTE — Progress Notes (Signed)
 OT Cancellation Note  Patient Details Name: LULABELLE DESTA MRN: [id number] DOB: [birth year]   Cancelled Treatment:    Reason Eval/Treat Not Completed: Pain limiting ability to participate (Patient stating increased pain and asked for OT to attempt later, patient premedicated.)  [name] 08/20/2024, 7:57 AM  [name], OTA Acute Rehabilitation Services  Office [phone]

## 2024-08-20 NOTE — Progress Notes (Signed)
 Orthopedic Tech Progress Note Patient Details:  Kathleen [name] Jul 16, 1961 [id number]  Ortho Devices Type of Ortho Device: Lumbar corsett Ortho Device/Splint Interventions: Ordered, Adjustment   Post Interventions Instructions Provided: Care of device, Adjustment of device LSO delivered to patients room, extension panels provided. Patient unsure about using brace because it is hard. Kathleen [name] 08/20/2024, 11:37 AM

## 2024-08-20 NOTE — Progress Notes (Signed)
 Occupational Therapy Treatment Patient Details Name: Kathleen [name] MRN: [id number] DOB: 08-26-1961 Today's Date: 08/20/2024   History of present illness [email or web address]   OT comments  Patient received in supine and agreeable to OOB to recliner. Patient requiring increased time and mod assist to get to EOB with log rolling technique. Patient was mod assist to donn back brace and increased time on EOB before attempting transfer to recliner. Patient assisted with positioning for comfort in recliner and setup for grooming.  Discharge recommendations continue to be appropriate. Acute OT to continue to follow to address established goals.       If plan is discharge home, recommend the following:  A little help with walking and/or transfers;A lot of help with bathing/dressing/bathroom;Assistance with cooking/housework;Assist for transportation;Help with stairs or ramp for entrance   Equipment Recommendations  None recommended by OT    Recommendations for Other Services      Precautions / Restrictions Precautions Precautions: Fall;Back Recall of Precautions/Restrictions: Intact Precaution/Restrictions Comments: on 5L at baseline Required Braces or Orthoses: Spinal Brace;Other Brace Spinal Brace: Thoracolumbosacral orthotic Other Brace: LSO brace used currently for transfers only, no TLSO at this time Restrictions Weight Bearing Restrictions Per Provider Order: No       Mobility Bed Mobility Overal bed mobility: Needs Assistance Bed Mobility: Rolling, Sidelying to Sit Rolling: Contact guard assist, Used rails Sidelying to sit: Mod assist, Used rails       General bed mobility comments: increased time and assistance with raising trunk    Transfers Overall transfer level: Needs assistance Equipment used: Rolling walker (2 wheels) Transfers: Sit to/from Stand, Bed to  chair/wheelchair/BSC Sit to Stand: Min assist, From elevated surface     Step pivot transfers: Min assist     General transfer comment: cues for hand placement     Balance Overall balance assessment: Needs assistance Sitting-balance support: No upper extremity supported, Feet supported Sitting balance-Leahy Scale: Fair Sitting balance - Comments: EOB   Standing balance support: Bilateral upper extremity supported, Reliant on assistive device for balance, During functional activity Standing balance-Leahy Scale: Poor Standing balance comment: reliant on BUE support when standing                           ADL either performed or assessed with clinical judgement   ADL Overall ADL's : Needs assistance/impaired     Grooming: Set up;Sitting Grooming Details (indicate cue type and reason): in recliner         Upper Body Dressing : Moderate assistance Upper Body Dressing Details (indicate cue type and reason): to donn brace Lower Body Dressing: Total assistance;Sitting/lateral leans   Toilet Transfer: Minimal assistance;Rolling walker (2 wheels) Toilet Transfer Details (indicate cue type and reason): simulated to recliner           General ADL Comments: limited by back pain    Extremity/Trunk Assessment              Vision       Perception     Praxis     Communication Communication Communication: No apparent difficulties   Cognition Arousal: Alert Behavior During Therapy: WFL for tasks assessed/performed Cognition: No apparent impairments                               Following commands: Intact        Cueing   Cueing Techniques: Verbal cues  Exercises  Shoulder Instructions       General Comments VSS on 5 liters O2    Pertinent Vitals/ Pain       Pain Assessment Pain Assessment: Faces Faces Pain Scale: Hurts even more Pain Location: back Pain Descriptors / Indicators: Grimacing, Guarding, Aching,  Discomfort Pain Intervention(s): Limited activity within patient's tolerance, Monitored during session, Premedicated before session, Repositioned  Home Living                                          Prior Functioning/Environment              Frequency  Min 2X/week        Progress Toward Goals  OT Goals(current goals can now be found in the care plan section)  Progress towards OT goals: Progressing toward goals  Acute Rehab OT Goals Patient Stated Goal: less pain and go home tomorrow OT Goal Formulation: With patient Time For Goal Achievement: 09/02/24 Potential to Achieve Goals: Good ADL Goals Pt Will Transfer to Toilet: with min assist;bedside commode (with caregiver assisting) Pt Will Perform Toileting - Clothing Manipulation and hygiene: with caregiver independent in assisting;with min assist;with adaptive equipment Additional ADL Goal #1: Pt will verbalize understanding of back precuations for mobility/ADL to reduce pain and increase function  Plan      Co-evaluation                 AM-PAC OT 6 Clicks Daily Activity     Outcome Measure   Help from another person eating meals?: None Help from another person taking care of personal grooming?: A Little Help from another person toileting, which includes using toliet, bedpan, or urinal?: Total Help from another person bathing (including washing, rinsing, drying)?: A Lot Help from another person to put on and taking off regular upper body clothing?: A Lot Help from another person to put on and taking off regular lower body clothing?: Total 6 Click Score: 13    End of Session Equipment Utilized During Treatment: Gait belt;Rolling walker (2 wheels);Back brace;Oxygen  (5 liters)  OT Visit Diagnosis: Unsteadiness on feet (R26.81);Other abnormalities of gait and mobility (R26.89);Muscle weakness (generalized) (M62.81);Pain Pain - part of body:  (back)   Activity Tolerance Patient tolerated  treatment well   Patient Left in chair;with call bell/phone within reach;with chair alarm set   Nurse Communication Mobility status        Time: 8867-8842 OT Time Calculation (min): 25 min  Charges: OT General Charges $OT Visit: 1 Visit OT Treatments $Self Care/Home Management : 8-22 mins $Therapeutic Activity: 8-22 mins  Dick [name], OTA Acute Rehabilitation Services  Office [phone]   Kathleen [name] 08/20/2024, 12:20 PM

## 2024-08-20 NOTE — Progress Notes (Signed)
 " PROGRESS NOTE    Kathleen [name]  FMW:[id number] DOB: 1961-02-03 DOA: 08/16/2024 PCP: Alphonsa Glendia LABOR, MD   Brief Narrative:  Kathleen [name] is a 63 y.o. female with medical history significant for chronic respiratory failure on 5 L, Addison's disease, CHF, hypothyroidism, ILD and OSA. Patient presented to the ED with complaints of congestion, chest pain, cough and difficulty breathing over the past several days.  She was in the ED with similar complaints 12/18, CTA chest was negative for PE, and troponins EKG were unremarkable. Patient also having severe pain in upper back, left rib cage under her breast. Husband reported left mid back/low back pain ongoing for past couple of weeks.  She says she has been basically bedridden due to severity of the pain.  Was initially admitted at [facility] hospital for treatment of pneumonia however patient and family was adamant about transfer to [facility] given back pain issues.  Has been treated with IV antibiotics for pneumonia.  MRI shows new T8 fracture which could be due to culprit for upper back/rib cage pain.   Assessment & Plan:   Principal Problem:   PNA (pneumonia) Active Problems:   Interstitial lung disease (HCC)   Chronic respiratory failure with hypoxia (HCC)   Anxiety and depression   Sarcoidosis   Fibromyalgia   Obstructive sleep apnea   Chronic pain syndrome   Hypothyroidism   Adrenal insufficiency (Addison's disease) (HCC)   Depression, major, single episode, moderate (HCC)  1-left lung pneumonia - Will continue treatment with IV ceftriaxone , azithromycin , supportive care , mucolytic's, bronchodilator therapy--> will complete 5 days of antibiotics. -Chronic respiratory failure on home oxygen  - CTA 12/18 with negative for PE/consolidation.  Chest x-ray 12/22 concerning for pneumonia  2-acute on chronic low back/mid back pain.  -Patient has history of lumbar compression vertebral fractures in the past.  Per husband, she has been  basically bedridden for the past 2 to 3 weeks due to severity of pain. -MRI of thoracolumbar spine obtained 12/23 shows T8 compression fracture which is new.  Chronic L1/L2 compression fracture without severe cervical stenosis. Neurosurgery was consulted 12/24 who recommended TLSO brace and outpatient follow-up. -Working on TLSO brace fitting. -Will continue pain management.  Unfortunately it is not controlled. - Will continue Robaxin  as needed, Norco, lidocaine  patch etc. lidocaine  ordered. - PT/OT as tolerated.  2-Addison's disease - Solu-Cortef  was increased to 100 mg 3 times daily 12/23 due to low blood pressure.  Midodrine  was increased.  Will taper down solucortefCortef. - Continue midodrine  and nasal calcitonin - Continue following electrolytes stability and maintain adequate hydration.   3-chronic diastolic heart failure - Stable and compensated - Continue home dose torsemide .   4-prolonged QT - Magnesium  within normal limits - Follow potassium and replete as needed - Continue minimizing agents that can further prolong QT.   5-chronic respiratory failure, interstitial lung disease and history of sarcoidosis/obstructive sleep apnea - Patient is chronically on 5 L supplementation - Continue to maintain oxygen  saturation above 90% - Continue bronchodilator management.     7-depression/anxiety - Continue BuSpar  - No suicidal ideation or hallucination.   8-hypothyroidism - Continue Synthroid .   DVT prophylaxis: Eliquis  Code Status: Full code. Family Communication: none Disposition:    Status is: Inpatient Remains inpatient appropriate because: Continue IV therapy.   Subjective:  Patient laying in bed on her right side.  Continues to report of left mid back pain radiating to left subcostal area.  She thinks pneumonia is not the main reason for  the chest pain.  Objective: Vitals:   08/20/24 0519 08/20/24 0635 08/20/24 0832 08/20/24 1028  BP: (!) 90/39 (!) 122/54   (!) 107/36  Pulse: (!) 56 (!) 56 (!) 55 (!) 56  Resp: 16 15 16 15   Temp:    98.3 F (36.8 C)  TempSrc:    Oral  SpO2: 100%  100% 100%  Weight:      Height:        Intake/Output Summary (Last 24 hours) at 08/20/2024 1304 Last data filed at 08/20/2024 1228 Gross per 24 hour  Intake 780 ml  Output --  Net 780 ml   Filed Weights   08/16/24 1619  Weight: 108.9 kg    Examination:  General: Patient is laying on her left side today, not in any acute distress.   HEENT: Moist oral mucosa Chest: Clear to auscultation bilaterally, diminished bilaterally, no wheezes CVs: S1, S2, no murmur, regular rhythm Abdomen: Soft, nontender to palpation  Data Reviewed: I have personally reviewed following labs and imaging studies  CBC: Recent Labs  Lab 08/16/24 1642 08/17/24 0458  WBC 10.7* 11.0*  HGB 9.3* 9.4*  HCT 30.3* 30.4*  MCV 88.6 88.4  PLT 423* 420*   Basic Metabolic Panel: Recent Labs  Lab 08/16/24 1642 08/17/24 0458  NA 139 141  K 3.5 3.8  CL 94* 98  CO2 31 38*  GLUCOSE 111* 106*  BUN 9 8  CREATININE 0.89 0.65  CALCIUM  9.8 9.8  MG 2.4  --    GFR: Estimated Creatinine Clearance: 98.7 mL/min (by C-G formula based on SCr of 0.65 mg/dL). Liver Function the left f: Recent Labs  Lab 08/16/24 1642  AST 19  ALT 12  ALKPHOS 93  BILITOT 0.3  PROT 6.9  ALBUMIN 3.8   No results for input(s): LIPASE, AMYLASE in the last 168 hours. No results for input(s): AMMONIA  in the last 168 hours. Coagulation Profile: No results for input(s): INR, PROTIME in the last 168 hours. Cardiac Enzymes: No results for input(s): CKTOTAL, CKMB, CKMBINDEX, TROPONINI in the last 168 hours. BNP (last 3 results) Recent Labs    05/30/24 2251 08/16/24 1642  PROBNP 192.0 87.8   HbA1C: No results for input(s): HGBA1C in the last 72 hours. CBG: Recent Labs  Lab 08/18/24 0250  GLUCAP 161*   Lipid Profile: No results for input(s): CHOL, HDL, LDLCALC, TRIG,  CHOLHDL, LDLDIRECT in the last 72 hours. Thyroid  Function Tests: No results for input(s): TSH, T4TOTAL, FREET4, T3FREE, THYROIDAB in the last 72 hours. Anemia Panel: No results for input(s): VITAMINB12, FOLATE, FERRITIN, TIBC, IRON , RETICCTPCT in the last 72 hours. Sepsis Labs: Recent Labs  Lab 08/16/24 1800  LATICACIDVEN 1.3    Recent Results (from the past 240 hours)  Resp panel by RT-PCR (RSV, Flu A&B, Covid) Anterior Nasal Swab     Status: None   Collection Time: 08/16/24  4:27 PM   Specimen: Anterior Nasal Swab  Result Value Ref Range Status   SARS Coronavirus 2 by RT PCR NEGATIVE NEGATIVE Final    Comment: (NOTE) SARS-CoV-2 target nucleic acids are NOT DETECTED.  The SARS-CoV-2 RNA is generally detectable in upper respiratory specimens during the acute phase of infection. The lowest concentration of SARS-CoV-2 viral copies this assay can detect is 138 copies/mL. A negative result does not preclude SARS-Cov-2 infection and should not be used as the sole basis for treatment or other patient management decisions. A negative result may occur with  improper specimen collection/handling, submission of specimen other  than nasopharyngeal swab, presence of viral mutation(s) within the areas targeted by this assay, and inadequate number of viral copies(<138 copies/mL). A negative result must be combined with clinical observations, patient history, and epidemiological information. The expected result is Negative.  Fact Sheet for Patients:  bloggercourse.com  Fact Sheet for Healthcare Providers:  seriousbroker.it  This test is no t yet approved or cleared by the [place]  FDA and  has been authorized for detection and/or diagnosis of SARS-CoV-2 by FDA under an Emergency Use Authorization (EUA). This EUA will remain  in effect (meaning this test can be used) for the duration of the COVID-19 declaration  under Section 564(b)(1) of the Act, 21 U.S.C.section 360bbb-3(b)(1), unless the authorization is terminated  or revoked sooner.       Influenza A by PCR NEGATIVE NEGATIVE Final   Influenza B by PCR NEGATIVE NEGATIVE Final    Comment: (NOTE) The Xpert Xpress SARS-CoV-2/FLU/RSV plus assay is intended as an aid in the diagnosis of influenza from Nasopharyngeal swab specimens and should not be used as a sole basis for treatment. Nasal washings and aspirates are unacceptable for Xpert Xpress SARS-CoV-2/FLU/RSV testing.  Fact Sheet for Patients: bloggercourse.com  Fact Sheet for Healthcare Providers: seriousbroker.it  This test is not yet approved or cleared by the [place]  FDA and has been authorized for detection and/or diagnosis of SARS-CoV-2 by FDA under an Emergency Use Authorization (EUA). This EUA will remain in effect (meaning this test can be used) for the duration of the COVID-19 declaration under Section 564(b)(1) of the Act, 21 U.S.C. section 360bbb-3(b)(1), unless the authorization is terminated or revoked.     Resp Syncytial Virus by PCR NEGATIVE NEGATIVE Final    Comment: (NOTE) Fact Sheet for Patients: bloggercourse.com  Fact Sheet for Healthcare Providers: seriousbroker.it  This test is not yet approved or cleared by the [place]  FDA and has been authorized for detection and/or diagnosis of SARS-CoV-2 by FDA under an Emergency Use Authorization (EUA). This EUA will remain in effect (meaning this test can be used) for the duration of the COVID-19 declaration under Section 564(b)(1) of the Act, 21 U.S.C. section 360bbb-3(b)(1), unless the authorization is terminated or revoked.  Performed at [facility], [address] [place], [place] [address]   Blood culture (routine x 2)     Status: None (Preliminary result)   Collection Time: 08/16/24  5:53 PM    Specimen: BLOOD RIGHT HAND  Result Value Ref Range Status   Specimen Description BLOOD RIGHT HAND  Final   Special Requests AEROBIC BOTTLE ONLY Blood Culture adequate volume  Final   Culture   Final    NO GROWTH 4 DAYS Performed at [facility], [address] [place], [place] [address]    Report Status PENDING  Incomplete  Blood culture (routine x 2)     Status: None (Preliminary result)   Collection Time: 08/16/24  6:00 PM   Specimen: Left Antecubital; Blood  Result Value Ref Range Status   Specimen Description LEFT ANTECUBITAL  Final   Special Requests   Final    BOTTLES DRAWN AEROBIC AND ANAEROBIC Blood Culture adequate volume   Culture   Final    NO GROWTH 4 DAYS Performed at [facility], [address] [place], [place] [address]    Report Status PENDING  Incomplete         Radiology Studies: DG CHEST PORT 1 VIEW Result Date: 08/20/2024 EXAM: 1 VIEW(S) XRAY OF THE CHEST 08/20/2024 05:42:00 AM COMPARISON: AP  chest 08/16/2024. CLINICAL HISTORY: Chest pain. FINDINGS: LUNGS AND PLEURA: Continued patchy opacities of the left lung base and a small left pleural effusion compatible with pneumonia and parapneumonic effusion. This is only slightly improved. The lungs are emphysematous with coarse perihilar scarring changes in the upper lobes with no other active process. No pneumothorax. HEART AND MEDIASTINUM: The mediastinum is stable with superior widening, aortic atherosclerosis. There is cardiomegaly without evidence for CHF. BONES AND SOFT TISSUES: Osteopenia and degenerative change of the spine. IMPRESSION: 1. Continued patchy opacities of the left lung base and a small left pleural effusion compatible with pneumonia and parapneumonic effusion, slightly improved. 2. Cardiomegaly without evidence of congestive heart failure. Electronically signed by: Francis Quam MD 08/20/2024 07:36 AM EST RP Workstation: [id number]   MR THORACIC SPINE W WO CONTRAST Result Date: 08/19/2024 EXAM:  MRI Thoracic and Lumbar Spine With and Without Intravenous Contrast 08/18/2024 08:52:00 PM TECHNIQUE: Multiplanar multisequence MRI of the thoracic and lumbar spine was performed with and without the administration of intravenous contrast. COMPARISON: 07/30/24 CT thoracic and lumbar spine. CLINICAL HISTORY: Mid-back pain, prior compression fracture FINDINGS: BONES AND ALIGNMENT: Normal alignment. Acute/recent or unhealed T8 compression fracture with 30% height loss, marrow edema, and intense enhancement. Fracture involves the pedicles bilaterally. Acute/recent or unhealed L1 and L5 superior endplate fractures with approximately 40% height loss. Associated marrow edema and enhancement. Remote T11, L3 and L4 compression fractures with approximately 20-30% height loss and no marrow edema. SPINAL CORD: Normal spinal cord size. Normal spinal cord signal. SOFT TISSUES: Unremarkable. THORACIC AND LUMBAR DISC LEVELS: No significant disc herniation. No spinal canal stenosis or neural foraminal narrowing. IMPRESSION: 1. In comparison to 12/4, new/interval acute T8 compression fracture with involvement of the pedicles bilaterally and 30% height loss. 2. Unhealed L1 and L5 superior endplate fractures with similar height loss and marrow edema. 3. Remote T11, L3 and L4 compression fractures. 4. No significant canal or foraminal stenosis in the thoracic or lumbar spine. Electronically signed by: Gilmore Molt 08/19/2024 03:35 AM EST RP Workstation: [id number]   MR Lumbar Spine W Wo Contrast Result Date: 08/19/2024 EXAM: MRI Thoracic and Lumbar Spine With and Without Intravenous Contrast 08/18/2024 08:52:00 PM TECHNIQUE: Multiplanar multisequence MRI of the thoracic and lumbar spine was performed with and without the administration of intravenous contrast. COMPARISON: 07/30/24 CT thoracic and lumbar spine. CLINICAL HISTORY: Mid-back pain, prior compression fracture FINDINGS: BONES AND ALIGNMENT: Normal alignment. Acute/recent  or unhealed T8 compression fracture with 30% height loss, marrow edema, and intense enhancement. Fracture involves the pedicles bilaterally. Acute/recent or unhealed L1 and L5 superior endplate fractures with approximately 40% height loss. Associated marrow edema and enhancement. Remote T11, L3 and L4 compression fractures with approximately 20-30% height loss and no marrow edema. SPINAL CORD: Normal spinal cord size. Normal spinal cord signal. SOFT TISSUES: Unremarkable. THORACIC AND LUMBAR DISC LEVELS: No significant disc herniation. No spinal canal stenosis or neural foraminal narrowing. IMPRESSION: 1. In comparison to 12/4, new/interval acute T8 compression fracture with involvement of the pedicles bilaterally and 30% height loss. 2. Unhealed L1 and L5 superior endplate fractures with similar height loss and marrow edema. 3. Remote T11, L3 and L4 compression fractures. 4. No significant canal or foraminal stenosis in the thoracic or lumbar spine. Electronically signed by: Gilmore Molt 08/19/2024 03:35 AM EST RP Workstation: [id number]        Scheduled Meds:  apixaban   5 mg Oral BID   busPIRone   15 mg Oral TID   calcitonin (salmon)  1  spray Alternating Nares Daily   doxycycline   100 mg Oral Q12H   hydrocortisone  sod succinate (SOLU-CORTEF ) inj  50 mg Intravenous Q8H   levothyroxine   50 mcg Oral QAC breakfast   lidocaine   1 patch Transdermal Q24H   methocarbamol   750 mg Oral Q8H   midodrine   10 mg Oral TID WC   pantoprazole   40 mg Oral BID   revefenacin   175 mcg Nebulization Daily   topiramate   50 mg Oral Q lunch   torsemide   40 mg Oral Daily   Continuous Infusions:  cefTRIAXone  (ROCEPHIN )  IV Stopped (08/19/24 1733)          Derryl Duval, MD [facility] 08/20/2024, 1:04 PM   "

## 2024-08-20 NOTE — Progress Notes (Signed)
 PT Cancellation Note  Patient Details Name: Kathleen [name] MRN: [id number] DOB: 1961/03/19  Cancelled Treatment:    Reason Eval/Treat Not Completed: Other (comment) (Pt without spinal brace present. A TLSO was ordered from [employer], but when it was fit to pt determined it was too small. The [employer] warehouse is closed for the holidays, so unsure when a new brace can be delivered. Per Chart Review from PT eval, pt reportedly had an LSO brace at home that her husband was supposed to bring in to use in the interim. A PT secure chatted Camie Pickle, PA-C yesterday who approved use of LSO brace for limit mobility to bed to/from chair until correct sized TLSO brace arrives. Pt reported she no longer has the LSO brace like she previously thought. Unable to mobilize pt without spinal brace donned. Will follow-up for PT treatment when the proper TLSO brace is present.)  Kathleen [name], PT, DPT Acute Rehabilitation Services Office: [phone] Secure Chat Preferred   Delon CHRISTELLA Callander 08/20/2024, 10:21 AM

## 2024-08-20 NOTE — Progress Notes (Signed)
 Physical Therapy Treatment Patient Details Name: Kathleen [name] MRN: [id number] DOB: [birth year] Today's Date: 08/20/2024   History of Present Illness 63 yo F adm 12/21 findings for L lung PNA 12/23 reports CP with L arm pain. Recent visit to ED 12/18 for CP, cough and difficulty breathing 12/23 MRI (+) acute T8 compression fx with involvement of pedicles bil, unhealed L1 L5 fx, remote T11 L3 and L4 compression fx, PMH: chronic hypoxic RF (5L O2), ILD, sarcoidosis, Addison's disease, morbid obesity, bilateral hip AVN, osteoporosis with multiple vertebral fractures on chronic narcotics, and admissions for intractable pain d/t compression fractures of L4 (4/12-4/16) and then L3 (5/9-5/14)    PT Comments  Pt greeted seated in recliner chair, eager to get back into bed d/t worsening pain. She sat up for ~15 minutes. Pt had an LSO donned, which Neuro PA-C Camie Pickle approved for use until the correct size TLSO arrives from [employer]. Pt's mobility is restricted to bed<>chair only until the preferred spinal brace arrives. She required CGA to transfer using RW and minA to bring BLE back into bed. Pt recalled back precautions and followed log roll technique. Doffed LSO while seated EOB. Returned pt to bed with her preferring to remain right sidelying. Applied heating pad and left pt with all needs met. She is primarily limited by pain and lack of TLSO brace to advance mobility. Will continue to follow acutely and advance appropriately.    If plan is discharge home, recommend the following: A little help with walking and/or transfers;A little help with bathing/dressing/bathroom;Assistance with cooking/housework;Assist for transportation;Help with stairs or ramp for entrance   Can travel by private vehicle        Equipment Recommendations  None recommended by PT    Recommendations for Other Services       Precautions / Restrictions Precautions Precautions: Fall;Back Recall of Precautions/Restrictions:  Intact Precaution/Restrictions Comments: on 5L at baseline Required Braces or Orthoses: Spinal Brace Spinal Brace: Lumbar corset Other Brace: LSO used in place of TLSO currently given incorrect size delivered by [employer] and no replacement available yet given the holiday. Dr. Mcarthur signed LSO order from [facility] as pt didn't have an LSO at home like was previously believed. Neuro PA Jayson Pickle) approved limited mobility of bed to chair only via secure chat message yesterday (12/24). Restrictions Weight Bearing Restrictions Per Provider Order: No     Mobility  Bed Mobility Overal bed mobility: Needs Assistance Bed Mobility: Rolling, Sit to Sidelying Rolling: Contact guard assist, Used rails       Sit to sidelying: Min assist General bed mobility comments: Cued pt on sequencing. She went down on right elbow/shoulder. Light assist to bring BLE back into bed. Pt declined to roll onto her back reporting she preferred to remain sidelying.    Transfers Overall transfer level: Needs assistance Equipment used: Rolling walker (2 wheels) Transfers: Sit to/from Stand, Bed to chair/wheelchair/BSC Sit to Stand: Contact guard assist   Step pivot transfers: Contact guard assist       General transfer comment: Pt stood from recliner chair. She demonstrated proper hand placement using RW. Transferred back to bed. Good eccentric control.    Ambulation/Gait Ambulation/Gait assistance: Contact guard assist Gait Distance (Feet): 2 Feet Assistive device: Rolling walker (2 wheels) Gait Pattern/deviations: Step-to pattern Gait velocity: decr     General Gait Details: Pt took a few short slow steps to transfer back to bed. She maintained proper proximity to RW and upright posture.   Stairs  Wheelchair Mobility     Tilt Bed    Modified Rankin (Stroke Patients Only)       Balance Overall balance assessment: Needs assistance Sitting-balance support: No upper  extremity supported, Feet supported Sitting balance-Leahy Scale: Fair Sitting balance - Comments: Sat EOB   Standing balance support: Bilateral upper extremity supported, Reliant on assistive device for balance, During functional activity Standing balance-Leahy Scale: Poor Standing balance comment: Dependent on RW                            Communication Communication Communication: No apparent difficulties  Cognition Arousal: Alert Behavior During Therapy: WFL for tasks assessed/performed   PT - Cognitive impairments: No apparent impairments                       PT - Cognition Comments: Pt internally distracted by pain. She was able to recall back precautions and follow log roll technique. Following commands: Intact      Cueing Cueing Techniques: Verbal cues  Exercises      General Comments General comments (skin integrity, edema, etc.): VSS on 5L O2 via .      Pertinent Vitals/Pain Pain Assessment Pain Assessment: 0-10 Pain Score: 10-Worst pain ever Pain Location: back Pain Descriptors / Indicators: Grimacing, Guarding, Aching, Moaning Pain Intervention(s): Monitored during session, Limited activity within patient's tolerance, Repositioned, Heat applied    Home Living                          Prior Function            PT Goals (current goals can now be found in the care plan section) Acute Rehab PT Goals Patient Stated Goal: Return Home PT Goal Formulation: With patient/family Time For Goal Achievement: 08/26/24 Potential to Achieve Goals: Good Progress towards PT goals: Progressing toward goals    Frequency    Min 4X/week      PT Plan      Co-evaluation              AM-PAC PT 6 Clicks Mobility   Outcome Measure  Help needed turning from your back to your side while in a flat bed without using bedrails?: A Little Help needed moving from lying on your back to sitting on the side of a flat bed without using  bedrails?: A Little Help needed moving to and from a bed to a chair (including a wheelchair)?: A Little Help needed standing up from a chair using your arms (e.g., wheelchair or bedside chair)?: A Little Help needed to walk in hospital room?: Total Help needed climbing 3-5 steps with a railing? : Total 6 Click Score: 14    End of Session Equipment Utilized During Treatment: Back brace;Gait belt;Oxygen  Activity Tolerance: Patient limited by pain Patient left: in bed;with call bell/phone within reach;with bed alarm set;with family/visitor present Nurse Communication: Mobility status PT Visit Diagnosis: Unsteadiness on feet (R26.81);Other abnormalities of gait and mobility (R26.89);Difficulty in walking, not elsewhere classified (R26.2);Pain Pain - part of body:  (Back)     Time: 8789-8774 PT Time Calculation (min) (ACUTE ONLY): 15 min  Charges:    $Therapeutic Activity: 8-22 mins PT General Charges $$ ACUTE PT VISIT: 1 Visit                     Randall SAUNDERS, PT, DPT Acute Rehabilitation Services Office: [phone] Secure Chat  Preferred  Delon HERO Leniyah Martell 08/20/2024, 1:18 PM

## 2024-08-20 NOTE — Plan of Care (Signed)

## 2024-08-21 ENCOUNTER — Telehealth: Payer: Self-pay

## 2024-08-21 DIAGNOSIS — J189 Pneumonia, unspecified organism: Secondary | ICD-10-CM | POA: Diagnosis not present

## 2024-08-21 DIAGNOSIS — E271 Primary adrenocortical insufficiency: Secondary | ICD-10-CM | POA: Diagnosis not present

## 2024-08-21 DIAGNOSIS — S22060A Wedge compression fracture of T7-T8 vertebra, initial encounter for closed fracture: Secondary | ICD-10-CM | POA: Diagnosis not present

## 2024-08-21 LAB — CULTURE, BLOOD (ROUTINE X 2)
Culture: NO GROWTH
Culture: NO GROWTH
Special Requests: ADEQUATE
Special Requests: ADEQUATE

## 2024-08-21 MED ORDER — HYDROMORPHONE HCL 2 MG PO TABS
1.0000 mg | ORAL_TABLET | ORAL | Status: DC | PRN
  Administered 2024-08-21 – 2024-08-22 (×2): 1 mg via ORAL
  Filled 2024-08-21 (×2): qty 1

## 2024-08-21 MED ORDER — HYDROCORTISONE SOD SUC (PF) 100 MG IJ SOLR
50.0000 mg | Freq: Two times a day (BID) | INTRAMUSCULAR | Status: DC
  Administered 2024-08-21 – 2024-08-22 (×3): 50 mg via INTRAVENOUS
  Filled 2024-08-21 (×3): qty 1

## 2024-08-21 MED ORDER — NALOXONE HCL 0.4 MG/ML IJ SOLN: 0.4000 mg | INTRAMUSCULAR | Status: DC | PRN

## 2024-08-21 MED ORDER — TRAZODONE HCL 50 MG PO TABS
25.0000 mg | ORAL_TABLET | Freq: Every evening | ORAL | Status: DC | PRN
  Administered 2024-08-21: 25 mg via ORAL
  Filled 2024-08-21: qty 1

## 2024-08-21 MED ORDER — SENNOSIDES-DOCUSATE SODIUM 8.6-50 MG PO TABS
1.0000 | ORAL_TABLET | Freq: Two times a day (BID) | ORAL | Status: DC
  Administered 2024-08-21 – 2024-08-26 (×11): 1 via ORAL
  Filled 2024-08-21 (×6): qty 1

## 2024-08-21 NOTE — Progress Notes (Signed)
 " PROGRESS NOTE    Kathleen [name]  FMW:[id number] DOB: 1961-03-03 DOA: 08/16/2024 PCP: Alphonsa Glendia LABOR, MD   Brief Narrative:  Kathleen [name] is a 63 y.o. female with medical history significant for chronic respiratory failure on 5 L, Addison's disease, CHF, hypothyroidism, ILD and OSA. Patient presented to the ED with complaints of congestion, chest pain, cough and difficulty breathing over the past several days.  She was in the ED with similar complaints 12/18, CTA chest was negative for PE, and troponins EKG were unremarkable. Patient also having severe pain in upper back, left rib cage under her breast. Husband reported left mid back/low back pain ongoing for past couple of weeks.  She says she has been basically bedridden due to severity of the pain.  Was initially admitted at [facility] hospital for treatment of pneumonia however patient and family was adamant about transfer to [facility] given back pain issues.  Has been treated with IV antibiotics for pneumonia.  MRI shows new T8 fracture which could be due to culprit for upper back/rib cage pain.   Assessment & Plan:   Principal Problem:   PNA (pneumonia) Active Problems:   Interstitial lung disease (HCC)   Chronic respiratory failure with hypoxia (HCC)   Anxiety and depression   Sarcoidosis   Fibromyalgia   Obstructive sleep apnea   Chronic pain syndrome   Hypothyroidism   Adrenal insufficiency (Addison's disease) (HCC)   Depression, major, single episode, moderate (HCC)  1-left lung pneumonia - Will continue treatment with IV ceftriaxone , azithromycin , supportive care , mucolytic's, bronchodilator therapy--> will complete 5 days of antibiotics. -Chronic respiratory failure on home oxygen  - CTA 12/18 with negative for PE/consolidation.  Chest x-ray 12/22 concerning for pneumonia  2-acute on chronic low back/mid back pain.  -Patient has history of lumbar compression vertebral fractures in the past.  Per husband, she has been  basically bedridden for the past 2 to 3 weeks due to severity of pain. -MRI of thoracolumbar spine obtained 12/23 shows T8 compression fracture which is new.  Chronic L1/L2 compression fracture without severe cervical stenosis. Neurosurgery was consulted 12/24 who recommended TLSO brace and outpatient follow-up. -Working on TLSO brace fitting. -Will continue pain management.  Unfortunately it is not controlled. - Will continue Robaxin  as needed, Norco, lidocaine  patch etc. lidocaine  ordered. - PT/OT as tolerated.  2-Addison's disease - Solu-Cortef  was increased to 100 mg 3 times daily 12/23 due to low blood pressure.  Midodrine  was increased.  Will taper down solucortefCortef. - Continue midodrine  and nasal calcitonin - Continue following electrolytes stability and maintain adequate hydration.   3-chronic diastolic heart failure - Stable and compensated - Continue home dose torsemide .   4-prolonged QT - Magnesium  within normal limits - Follow potassium and replete as needed - Continue minimizing agents that can further prolong QT.   5-chronic respiratory failure, interstitial lung disease and history of sarcoidosis/obstructive sleep apnea - Patient is chronically on 5 L supplementation - Continue to maintain oxygen  saturation above 90% - Continue bronchodilator management.     7-depression/anxiety - Continue BuSpar  - No suicidal ideation or hallucination.   8-hypothyroidism - Continue Synthroid .   DVT prophylaxis: Eliquis  Code Status: Full code. Family Communication: none Disposition:    Status is: Inpatient Remains inpatient appropriate because: Continue IV therapy.   Subjective:  Patient laying in bed on her right side.  Continues to report of left mid back pain radiating to left subcostal area.  She thinks pneumonia is not the main reason for  the chest pain.  Objective: Vitals:   08/21/24 0823 08/21/24 0844 08/21/24 1009 08/21/24 1012  BP:  (!) 113/51 (!) 100/27  (!) 108/38  Pulse: 68 79 (!) 54 (!) 55  Resp: 16  17   Temp:   97.6 F (36.4 C)   TempSrc:   Oral   SpO2: 100%  100%   Weight:      Height:        Intake/Output Summary (Last 24 hours) at 08/21/2024 1332 Last data filed at 08/20/2024 1928 Gross per 24 hour  Intake 480 ml  Output --  Net 480 ml   Filed Weights   08/16/24 1619  Weight: 108.9 kg    Examination:  General: Patient is laying on her left side today, not in any acute distress.   HEENT: Moist oral mucosa Chest: Clear to auscultation bilaterally, diminished bilaterally, no wheezes CVs: S1, S2, no murmur, regular rhythm Abdomen: Soft, nontender to palpation  Data Reviewed: I have personally reviewed following labs and imaging studies  CBC: Recent Labs  Lab 08/16/24 1642 08/17/24 0458  WBC 10.7* 11.0*  HGB 9.3* 9.4*  HCT 30.3* 30.4*  MCV 88.6 88.4  PLT 423* 420*   Basic Metabolic Panel: Recent Labs  Lab 08/16/24 1642 08/17/24 0458  NA 139 141  K 3.5 3.8  CL 94* 98  CO2 31 38*  GLUCOSE 111* 106*  BUN 9 8  CREATININE 0.89 0.65  CALCIUM  9.8 9.8  MG 2.4  --    GFR: Estimated Creatinine Clearance: 98.7 mL/min (by C-G formula based on SCr of 0.65 mg/dL). Liver Function the left f: Recent Labs  Lab 08/16/24 1642  AST 19  ALT 12  ALKPHOS 93  BILITOT 0.3  PROT 6.9  ALBUMIN 3.8   No results for input(s): LIPASE, AMYLASE in the last 168 hours. No results for input(s): AMMONIA  in the last 168 hours. Coagulation Profile: No results for input(s): INR, PROTIME in the last 168 hours. Cardiac Enzymes: No results for input(s): CKTOTAL, CKMB, CKMBINDEX, TROPONINI in the last 168 hours. BNP (last 3 results) Recent Labs    05/30/24 2251 08/16/24 1642  PROBNP 192.0 87.8   HbA1C: No results for input(s): HGBA1C in the last 72 hours. CBG: Recent Labs  Lab 08/18/24 0250  GLUCAP 161*   Lipid Profile: No results for input(s): CHOL, HDL, LDLCALC, TRIG, CHOLHDL,  LDLDIRECT in the last 72 hours. Thyroid  Function Tests: No results for input(s): TSH, T4TOTAL, FREET4, T3FREE, THYROIDAB in the last 72 hours. Anemia Panel: No results for input(s): VITAMINB12, FOLATE, FERRITIN, TIBC, IRON , RETICCTPCT in the last 72 hours. Sepsis Labs: Recent Labs  Lab 08/16/24 1800  LATICACIDVEN 1.3    Recent Results (from the past 240 hours)  Resp panel by RT-PCR (RSV, Flu A&B, Covid) Anterior Nasal Swab     Status: None   Collection Time: 08/16/24  4:27 PM   Specimen: Anterior Nasal Swab  Result Value Ref Range Status   SARS Coronavirus 2 by RT PCR NEGATIVE NEGATIVE Final    Comment: (NOTE) SARS-CoV-2 target nucleic acids are NOT DETECTED.  The SARS-CoV-2 RNA is generally detectable in upper respiratory specimens during the acute phase of infection. The lowest concentration of SARS-CoV-2 viral copies this assay can detect is 138 copies/mL. A negative result does not preclude SARS-Cov-2 infection and should not be used as the sole basis for treatment or other patient management decisions. A negative result may occur with  improper specimen collection/handling, submission of specimen other than nasopharyngeal  swab, presence of viral mutation(s) within the areas targeted by this assay, and inadequate number of viral copies(<138 copies/mL). A negative result must be combined with clinical observations, patient history, and epidemiological information. The expected result is Negative.  Fact Sheet for Patients:  bloggercourse.com  Fact Sheet for Healthcare Providers:  seriousbroker.it  This test is no t yet approved or cleared by the [place]  FDA and  has been authorized for detection and/or diagnosis of SARS-CoV-2 by FDA under an Emergency Use Authorization (EUA). This EUA will remain  in effect (meaning this test can be used) for the duration of the COVID-19 declaration under  Section 564(b)(1) of the Act, 21 U.S.C.section 360bbb-3(b)(1), unless the authorization is terminated  or revoked sooner.       Influenza A by PCR NEGATIVE NEGATIVE Final   Influenza B by PCR NEGATIVE NEGATIVE Final    Comment: (NOTE) The Xpert Xpress SARS-CoV-2/FLU/RSV plus assay is intended as an aid in the diagnosis of influenza from Nasopharyngeal swab specimens and should not be used as a sole basis for treatment. Nasal washings and aspirates are unacceptable for Xpert Xpress SARS-CoV-2/FLU/RSV testing.  Fact Sheet for Patients: bloggercourse.com  Fact Sheet for Healthcare Providers: seriousbroker.it  This test is not yet approved or cleared by the [place]  FDA and has been authorized for detection and/or diagnosis of SARS-CoV-2 by FDA under an Emergency Use Authorization (EUA). This EUA will remain in effect (meaning this test can be used) for the duration of the COVID-19 declaration under Section 564(b)(1) of the Act, 21 U.S.C. section 360bbb-3(b)(1), unless the authorization is terminated or revoked.     Resp Syncytial Virus by PCR NEGATIVE NEGATIVE Final    Comment: (NOTE) Fact Sheet for Patients: bloggercourse.com  Fact Sheet for Healthcare Providers: seriousbroker.it  This test is not yet approved or cleared by the [place]  FDA and has been authorized for detection and/or diagnosis of SARS-CoV-2 by FDA under an Emergency Use Authorization (EUA). This EUA will remain in effect (meaning this test can be used) for the duration of the COVID-19 declaration under Section 564(b)(1) of the Act, 21 U.S.C. section 360bbb-3(b)(1), unless the authorization is terminated or revoked.  Performed at [facility], [address], [place], [place] [address]   Blood culture (routine x 2)     Status: None   Collection Time: 08/16/24  5:53 PM   Specimen: BLOOD RIGHT HAND   Result Value Ref Range Status   Specimen Description BLOOD RIGHT HAND  Final   Special Requests AEROBIC BOTTLE ONLY Blood Culture adequate volume  Final   Culture   Final    NO GROWTH 5 DAYS Performed at [facility], [address] [place], [place] [address]    Report Status 08/21/2024 FINAL  Final  Blood culture (routine x 2)     Status: None   Collection Time: 08/16/24  6:00 PM   Specimen: Left Antecubital; Blood  Result Value Ref Range Status   Specimen Description LEFT ANTECUBITAL  Final   Special Requests   Final    BOTTLES DRAWN AEROBIC AND ANAEROBIC Blood Culture adequate volume   Culture   Final    NO GROWTH 5 DAYS Performed at [facility], [address] [place], [place] [address]    Report Status 08/21/2024 FINAL  Final         Radiology Studies: DG CHEST PORT 1 VIEW Result Date: 08/20/2024 EXAM: 1 VIEW(S) XRAY OF THE CHEST 08/20/2024 05:42:00 AM COMPARISON: AP chest 08/16/2024. CLINICAL HISTORY:  Chest pain. FINDINGS: LUNGS AND PLEURA: Continued patchy opacities of the left lung base and a small left pleural effusion compatible with pneumonia and parapneumonic effusion. This is only slightly improved. The lungs are emphysematous with coarse perihilar scarring changes in the upper lobes with no other active process. No pneumothorax. HEART AND MEDIASTINUM: The mediastinum is stable with superior widening, aortic atherosclerosis. There is cardiomegaly without evidence for CHF. BONES AND SOFT TISSUES: Osteopenia and degenerative change of the spine. IMPRESSION: 1. Continued patchy opacities of the left lung base and a small left pleural effusion compatible with pneumonia and parapneumonic effusion, slightly improved. 2. Cardiomegaly without evidence of congestive heart failure. Electronically signed by: Francis Quam MD 08/20/2024 07:36 AM EST RP Workstation: [id number]        Scheduled Meds:  apixaban   5 mg Oral BID   busPIRone   15 mg Oral TID   calcitonin (salmon)   1 spray Alternating Nares Daily   doxycycline   100 mg Oral Q12H   hydrocortisone  sod succinate (SOLU-CORTEF ) inj  50 mg Intravenous Q12H   levothyroxine   50 mcg Oral QAC breakfast   lidocaine   1 patch Transdermal Q24H   methocarbamol   750 mg Oral Q8H   midodrine   10 mg Oral TID WC   pantoprazole   40 mg Oral BID   revefenacin   175 mcg Nebulization Daily   senna-docusate  1 tablet Oral BID   topiramate   50 mg Oral Q lunch   torsemide   40 mg Oral Daily   Continuous Infusions:  cefTRIAXone  (ROCEPHIN )  IV 2 g (08/20/24 1721)          Derryl Duval, MD [facility] 08/21/2024, 1:32 PM   "

## 2024-08-21 NOTE — Plan of Care (Signed)
   Problem: Education: Goal: Knowledge of General Education information will improve Description: Including pain rating scale, medication(s)/side effects and non-pharmacologic comfort measures Outcome: Progressing   Problem: Health Behavior/Discharge Planning: Goal: Ability to manage health-related needs will improve Outcome: Progressing   Problem: Nutrition: Goal: Adequate nutrition will be maintained Outcome: Progressing

## 2024-08-21 NOTE — Progress Notes (Addendum)
 PT Cancellation Note  Patient Details Name: Kathleen [name] MRN: [id number] DOB: Sep 07, 1960   Cancelled Treatment:    Reason Eval/Treat Not Completed: (P) Medical issues which prohibited therapy (DBP very low 108/38 last reading per chart review), RN messaged to see if pt stable to see, per RN rechecking BP and pt requesting PTA reattempt later in the day.) Addendum 1434: BP MAP (54), not yet safe to attempt OOB due to low BP.  Will continue efforts per PT plan of care as schedule permits once pt medically stable.SABRA [name] HERO [name] 08/21/2024, 1:56 PM

## 2024-08-21 NOTE — Progress Notes (Signed)
 Complex Care Management Care Guide Note  08/21/2024 Name: Kathleen [name] MRN: [id number] DOB: 07-Oct-1960  Kathleen [name] is a 63 y.o. year old female who is a primary care patient of Luking, Glendia LABOR, MD and is actively engaged with the care management team. I reached out to Kathleen [name] by phone today to assist with re-scheduling  with the RN Case Manager.  Follow up plan: The care guide will reach out to the patient again over the next 7 days. Patient hospitalized.  Dreama [facility]  [facility] VBCI Assistant Direct Dial: [phone]  Fax: [phone]

## 2024-08-21 NOTE — Progress Notes (Signed)
 " PROGRESS NOTE    [name]  FMW:[id number] DOB: Dec 01, 1960 DOA: 08/16/2024 PCP: Alphonsa Glendia LABOR, MD   Brief Narrative:  Kathleen [name] is a 63 y.o. female with medical history significant for chronic respiratory failure on 5 L, Addison's disease, CHF, hypothyroidism, ILD and OSA. Patient presented to the ED with complaints of congestion, chest pain, cough and difficulty breathing over the past several days.  She was in the ED with similar complaints 12/18, CTA chest was negative for PE, and troponins EKG were unremarkable. Patient also having severe pain in upper back, left rib cage under her breast. Husband reported left mid back/low back pain ongoing for past couple of weeks.  She says she has been basically bedridden due to severity of the pain.  Was initially admitted at [facility] hospital for treatment of pneumonia however patient and family was adamant about transfer to [facility] given back pain issues.  Has been treated with IV antibiotics for pneumonia.  MRI shows new T8 fracture which could be due to culprit for upper back/rib cage pain.   Assessment & Plan:   Principal Problem:   PNA (pneumonia) Active Problems:   Interstitial lung disease (HCC)   Chronic respiratory failure with hypoxia (HCC)   Anxiety and depression   Sarcoidosis   Fibromyalgia   Obstructive sleep apnea   Chronic pain syndrome   Hypothyroidism   Adrenal insufficiency (Addison's disease) (HCC)   Depression, major, single episode, moderate (HCC)  1-left lung pneumonia - Will continue treatment with IV ceftriaxone , azithromycin , supportive care , mucolytic's, bronchodilator therapy--> will complete 5 days of antibiotics. -Chronic respiratory failure on home oxygen  - CTA 12/18 with negative for PE/consolidation.  Chest x-ray 12/22 concerning for pneumonia  2-acute on chronic low back/mid back pain.  -Patient has history of lumbar compression vertebral fractures in the past.  Per husband, she has been  basically bedridden for the past 2 to 3 weeks due to severity of pain. -MRI of thoracolumbar spine obtained 12/23 shows T8 compression fracture which is new.  Chronic L1/L2 compression fracture without severe cervical stenosis. Neurosurgery was consulted 12/24 who recommended TLSO brace and outpatient follow-up. -Working on TLSO brace fitting.   -Will continue pain management.  Unfortunately it is not controlled.  Patient says Dilaudid  worked better than oxycodone . - Will continue Robaxin  as needed,lidocaine  patch etc. lidocaine  ordered.  Switch Oxy to Dilaudid . - PT/OT as tolerated.  2-Addison's disease - Solu-Cortef  was increased to 100 mg 3 times daily 12/23 due to low blood pressure.  Midodrine  was increased.  Will taper down solucortefCortef.  50 twice daily today - Continue midodrine  and nasal calcitonin - Continue following electrolytes stability and maintain adequate hydration.   3-chronic diastolic heart failure - Stable and compensated - Continue home dose torsemide .   4-prolonged QT - Magnesium  within normal limits - Follow potassium and replete as needed - Continue minimizing agents that can further prolong QT.   5-chronic respiratory failure, interstitial lung disease and history of sarcoidosis/obstructive sleep apnea - Patient is chronically on 5 L supplementation - Continue to maintain oxygen  saturation above 90% - Continue bronchodilator management.     7-depression/anxiety - Continue BuSpar  - No suicidal ideation or hallucination.   8-hypothyroidism - Continue Synthroid .   DVT prophylaxis: Eliquis  Code Status: Full code. Family Communication: none Disposition: Home with home health 12/26.   Status is: Inpatient Remains inpatient appropriate because: Continue IV therapy.  Pain control   Subjective:  Patient laying in bed on her right  side.  Continues to report of left mid back pain radiating to left subcostal area.  This was at the bedside since patient  may not wearing brace even if  will have right one fitted for her.   Objective: Vitals:   08/21/24 1009 08/21/24 1012 08/21/24 1425 08/21/24 1453  BP: (!) 100/27 (!) 108/38 (!) 97/30 (!) 113/33  Pulse: (!) 54 (!) 55 (!) 56 (!) 58  Resp: 17     Temp: 97.6 F (36.4 C)     TempSrc: Oral     SpO2: 100%     Weight:      Height:        Intake/Output Summary (Last 24 hours) at 08/21/2024 1530 Last data filed at 08/20/2024 1928 Gross per 24 hour  Intake 480 ml  Output --  Net 480 ml   Filed Weights   08/16/24 1619  Weight: 108.9 kg    Examination:  General: Patient is laying on her left side today, not in any acute distress.   HEENT: Moist oral mucosa Chest: Clear to auscultation bilaterally, diminished bilaterally, no wheezes CVs: S1, S2, no murmur, regular rhythm Abdomen: Soft, nontender to palpation  Data Reviewed: I have personally reviewed following labs and imaging studies  CBC: Recent Labs  Lab 08/16/24 1642 08/17/24 0458  WBC 10.7* 11.0*  HGB 9.3* 9.4*  HCT 30.3* 30.4*  MCV 88.6 88.4  PLT 423* 420*   Basic Metabolic Panel: Recent Labs  Lab 08/16/24 1642 08/17/24 0458  NA 139 141  K 3.5 3.8  CL 94* 98  CO2 31 38*  GLUCOSE 111* 106*  BUN 9 8  CREATININE 0.89 0.65  CALCIUM  9.8 9.8  MG 2.4  --    GFR: Estimated Creatinine Clearance: 98.7 mL/min (by C-G formula based on SCr of 0.65 mg/dL). Liver Function the left f: Recent Labs  Lab 08/16/24 1642  AST 19  ALT 12  ALKPHOS 93  BILITOT 0.3  PROT 6.9  ALBUMIN 3.8   No results for input(s): LIPASE, AMYLASE in the last 168 hours. No results for input(s): AMMONIA  in the last 168 hours. Coagulation Profile: No results for input(s): INR, PROTIME in the last 168 hours. Cardiac Enzymes: No results for input(s): CKTOTAL, CKMB, CKMBINDEX, TROPONINI in the last 168 hours. BNP (last 3 results) Recent Labs    05/30/24 2251 08/16/24 1642  PROBNP 192.0 87.8   HbA1C: No results for  input(s): HGBA1C in the last 72 hours. CBG: Recent Labs  Lab 08/18/24 0250  GLUCAP 161*   Lipid Profile: No results for input(s): CHOL, HDL, LDLCALC, TRIG, CHOLHDL, LDLDIRECT in the last 72 hours. Thyroid  Function Tests: No results for input(s): TSH, T4TOTAL, FREET4, T3FREE, THYROIDAB in the last 72 hours. Anemia Panel: No results for input(s): VITAMINB12, FOLATE, FERRITIN, TIBC, IRON , RETICCTPCT in the last 72 hours. Sepsis Labs: Recent Labs  Lab 08/16/24 1800  LATICACIDVEN 1.3    Recent Results (from the past 240 hours)  Resp panel by RT-PCR (RSV, Flu A&B, Covid) Anterior Nasal Swab     Status: None   Collection Time: 08/16/24  4:27 PM   Specimen: Anterior Nasal Swab  Result Value Ref Range Status   SARS Coronavirus 2 by RT PCR NEGATIVE NEGATIVE Final    Comment: (NOTE) SARS-CoV-2 target nucleic acids are NOT DETECTED.  The SARS-CoV-2 RNA is generally detectable in upper respiratory specimens during the acute phase of infection. The lowest concentration of SARS-CoV-2 viral copies this assay can detect is 138 copies/mL. A negative  result does not preclude SARS-Cov-2 infection and should not be used as the sole basis for treatment or other patient management decisions. A negative result may occur with  improper specimen collection/handling, submission of specimen other than nasopharyngeal swab, presence of viral mutation(s) within the areas targeted by this assay, and inadequate number of viral copies(<138 copies/mL). A negative result must be combined with clinical observations, patient history, and epidemiological information. The expected result is Negative.  Fact Sheet for Patients:  bloggercourse.com  Fact Sheet for Healthcare Providers:  seriousbroker.it  This test is no t yet approved or cleared by the [place]  FDA and  has been authorized for detection and/or diagnosis of  SARS-CoV-2 by FDA under an Emergency Use Authorization (EUA). This EUA will remain  in effect (meaning this test can be used) for the duration of the COVID-19 declaration under Section 564(b)(1) of the Act, 21 U.S.C.section 360bbb-3(b)(1), unless the authorization is terminated  or revoked sooner.       Influenza A by PCR NEGATIVE NEGATIVE Final   Influenza B by PCR NEGATIVE NEGATIVE Final    Comment: (NOTE) The Xpert Xpress SARS-CoV-2/FLU/RSV plus assay is intended as an aid in the diagnosis of influenza from Nasopharyngeal swab specimens and should not be used as a sole basis for treatment. Nasal washings and aspirates are unacceptable for Xpert Xpress SARS-CoV-2/FLU/RSV testing.  Fact Sheet for Patients: bloggercourse.com  Fact Sheet for Healthcare Providers: seriousbroker.it  This test is not yet approved or cleared by the [place]  FDA and has been authorized for detection and/or diagnosis of SARS-CoV-2 by FDA under an Emergency Use Authorization (EUA). This EUA will remain in effect (meaning this test can be used) for the duration of the COVID-19 declaration under Section 564(b)(1) of the Act, 21 U.S.C. section 360bbb-3(b)(1), unless the authorization is terminated or revoked.     Resp Syncytial Virus by PCR NEGATIVE NEGATIVE Final    Comment: (NOTE) Fact Sheet for Patients: bloggercourse.com  Fact Sheet for Healthcare Providers: seriousbroker.it  This test is not yet approved or cleared by the [place]  FDA and has been authorized for detection and/or diagnosis of SARS-CoV-2 by FDA under an Emergency Use Authorization (EUA). This EUA will remain in effect (meaning this test can be used) for the duration of the COVID-19 declaration under Section 564(b)(1) of the Act, 21 U.S.C. section 360bbb-3(b)(1), unless the authorization is terminated  or revoked.  Performed at [facility], [address] [place], [place] [address]   Blood culture (routine x 2)     Status: None   Collection Time: 08/16/24  5:53 PM   Specimen: BLOOD RIGHT HAND  Result Value Ref Range Status   Specimen Description BLOOD RIGHT HAND  Final   Special Requests AEROBIC BOTTLE ONLY Blood Culture adequate volume  Final   Culture   Final    NO GROWTH 5 DAYS Performed at [facility], [address], [place], [place] [address]    Report Status 08/21/2024 FINAL  Final  Blood culture (routine x 2)     Status: None   Collection Time: 08/16/24  6:00 PM   Specimen: Left Antecubital; Blood  Result Value Ref Range Status   Specimen Description LEFT ANTECUBITAL  Final   Special Requests   Final    BOTTLES DRAWN AEROBIC AND ANAEROBIC Blood Culture adequate volume   Culture   Final    NO GROWTH 5 DAYS Performed at [facility], [address] [place], [place] [address]    Report Status  08/21/2024 FINAL  Final         Radiology Studies: DG CHEST PORT 1 VIEW Result Date: 08/20/2024 EXAM: 1 VIEW(S) XRAY OF THE CHEST 08/20/2024 05:42:00 AM COMPARISON: AP chest 08/16/2024. CLINICAL HISTORY: Chest pain. FINDINGS: LUNGS AND PLEURA: Continued patchy opacities of the left lung base and a small left pleural effusion compatible with pneumonia and parapneumonic effusion. This is only slightly improved. The lungs are emphysematous with coarse perihilar scarring changes in the upper lobes with no other active process. No pneumothorax. HEART AND MEDIASTINUM: The mediastinum is stable with superior widening, aortic atherosclerosis. There is cardiomegaly without evidence for CHF. BONES AND SOFT TISSUES: Osteopenia and degenerative change of the spine. IMPRESSION: 1. Continued patchy opacities of the left lung base and a small left pleural effusion compatible with pneumonia and parapneumonic effusion, slightly improved. 2. Cardiomegaly without evidence of congestive heart failure.  Electronically signed by: Francis Quam MD 08/20/2024 07:36 AM EST RP Workstation: [id number]        Scheduled Meds:  apixaban   5 mg Oral BID   busPIRone   15 mg Oral TID   calcitonin (salmon)  1 spray Alternating Nares Daily   doxycycline   100 mg Oral Q12H   hydrocortisone  sod succinate (SOLU-CORTEF ) inj  50 mg Intravenous Q12H   levothyroxine   50 mcg Oral QAC breakfast   lidocaine   1 patch Transdermal Q24H   methocarbamol   750 mg Oral Q8H   midodrine   10 mg Oral TID WC   pantoprazole   40 mg Oral BID   revefenacin   175 mcg Nebulization Daily   senna-docusate  1 tablet Oral BID   topiramate   50 mg Oral Q lunch   torsemide   40 mg Oral Daily   Continuous Infusions:  cefTRIAXone  (ROCEPHIN )  IV 2 g (08/20/24 1721)          Derryl Duval, MD [facility] 08/21/2024, 3:30 PM   "

## 2024-08-21 NOTE — Care Management (Signed)
 Pt sleeping, called her name and pt briefly  opened eyes. Asked husband to call staff when pt was awake to administer pain medication.

## 2024-08-22 ENCOUNTER — Other Ambulatory Visit (HOSPITAL_COMMUNITY): Payer: Self-pay

## 2024-08-22 DIAGNOSIS — J189 Pneumonia, unspecified organism: Secondary | ICD-10-CM | POA: Diagnosis not present

## 2024-08-22 DIAGNOSIS — E271 Primary adrenocortical insufficiency: Secondary | ICD-10-CM | POA: Diagnosis not present

## 2024-08-22 DIAGNOSIS — S22060A Wedge compression fracture of T7-T8 vertebra, initial encounter for closed fracture: Secondary | ICD-10-CM | POA: Diagnosis not present

## 2024-08-22 MED ORDER — ROSUVASTATIN CALCIUM 20 MG PO TABS
20.0000 mg | ORAL_TABLET | Freq: Every day | ORAL | Status: DC
  Administered 2024-08-22 – 2024-08-26 (×5): 20 mg via ORAL
  Filled 2024-08-22 (×2): qty 1

## 2024-08-22 MED ORDER — HYDROXYZINE HCL 10 MG PO TABS
10.0000 mg | ORAL_TABLET | Freq: Three times a day (TID) | ORAL | Status: DC | PRN
  Administered 2024-08-22 – 2024-08-24 (×2): 10 mg via ORAL
  Filled 2024-08-22: qty 1

## 2024-08-22 MED ORDER — HYDROMORPHONE HCL 2 MG PO TABS
1.0000 mg | ORAL_TABLET | ORAL | Status: DC | PRN
  Administered 2024-08-22 – 2024-08-23 (×6): 1 mg via ORAL
  Filled 2024-08-22 (×6): qty 1

## 2024-08-22 MED ORDER — VITAMIN D (ERGOCALCIFEROL) 1.25 MG (50000 UNIT) PO CAPS
50000.0000 [IU] | ORAL_CAPSULE | ORAL | Status: DC
  Administered 2024-08-23: 50000 [IU] via ORAL
  Filled 2024-08-22: qty 1

## 2024-08-22 MED ORDER — POTASSIUM CHLORIDE CRYS ER 10 MEQ PO TBCR
10.0000 meq | EXTENDED_RELEASE_TABLET | Freq: Two times a day (BID) | ORAL | Status: DC
  Administered 2024-08-22 – 2024-08-26 (×8): 10 meq via ORAL
  Filled 2024-08-22 (×3): qty 1

## 2024-08-22 MED ORDER — LINACLOTIDE 145 MCG PO CAPS
145.0000 ug | ORAL_CAPSULE | Freq: Every day | ORAL | Status: DC
  Administered 2024-08-23 – 2024-08-26 (×3): 145 ug via ORAL
  Filled 2024-08-22 (×2): qty 1

## 2024-08-22 MED ORDER — GABAPENTIN 300 MG PO CAPS
300.0000 mg | ORAL_CAPSULE | Freq: Four times a day (QID) | ORAL | Status: DC
  Administered 2024-08-22 – 2024-08-26 (×15): 300 mg via ORAL
  Filled 2024-08-22 (×6): qty 1

## 2024-08-22 MED ORDER — FOLIC ACID 1 MG PO TABS
1.0000 mg | ORAL_TABLET | Freq: Every day | ORAL | Status: DC
  Administered 2024-08-22 – 2024-08-26 (×5): 1 mg via ORAL
  Filled 2024-08-22 (×2): qty 1

## 2024-08-22 MED ORDER — HYDROCORTISONE 10 MG PO TABS
10.0000 mg | ORAL_TABLET | Freq: Every day | ORAL | Status: DC
  Administered 2024-08-23 – 2024-08-26 (×4): 10 mg via ORAL
  Filled 2024-08-22: qty 1

## 2024-08-22 MED ORDER — HYDROCORTISONE 20 MG PO TABS
20.0000 mg | ORAL_TABLET | Freq: Every day | ORAL | Status: DC
  Administered 2024-08-23 – 2024-08-26 (×4): 20 mg via ORAL
  Filled 2024-08-22 (×2): qty 1

## 2024-08-22 MED ORDER — HYDROMORPHONE HCL 2 MG PO TABS
1.0000 mg | ORAL_TABLET | ORAL | 0 refills | Status: DC | PRN
  Filled 2024-08-22: qty 15, 5d supply, fill #0

## 2024-08-22 MED ORDER — FERROUS SULFATE 325 (65 FE) MG PO TABS
325.0000 mg | ORAL_TABLET | ORAL | Status: DC
  Administered 2024-08-22: 325 mg via ORAL
  Filled 2024-08-22: qty 1

## 2024-08-22 MED ORDER — HYDROCORTISONE 10 MG PO TABS
10.0000 mg | ORAL_TABLET | Freq: Once | ORAL | Status: DC
  Filled 2024-08-22: qty 1

## 2024-08-22 NOTE — Plan of Care (Signed)

## 2024-08-22 NOTE — Plan of Care (Signed)
  Problem: Coping: Goal: Level of anxiety will decrease Outcome: Progressing   Problem: Pain Managment: Goal: General experience of comfort will improve and/or be controlled Outcome: Progressing   Problem: Safety: Goal: Ability to remain free from injury will improve Outcome: Progressing   Problem: Skin Integrity: Goal: Risk for impaired skin integrity will decrease Outcome: Progressing

## 2024-08-22 NOTE — Progress Notes (Signed)
 " PROGRESS NOTE    Kathleen [name]  FMW:[id number] DOB: [birth year] DOA: 08/16/2024 PCP: Alphonsa Glendia LABOR, MD   Brief Narrative:  Kathleen [name] is a 63 y.o. female with medical history significant for chronic respiratory failure on 5 L, Addison's disease, CHF, hypothyroidism, ILD and OSA. Patient presented to the ED with complaints of congestion, chest pain, cough and difficulty breathing over the past several days.  She was in the ED with similar complaints 12/18, CTA chest was negative for PE, and troponins EKG were unremarkable. Patient also having severe pain in upper back, left rib cage under her breast. Husband reported left mid back/low back pain ongoing for past couple of weeks.  She says she has been basically bedridden due to severity of the pain.  Was initially admitted at [facility] hospital for treatment of pneumonia however patient and family was adamant about transfer to [facility] given back pain issues.  Has been treated with IV antibiotics for pneumonia.  MRI shows new T8 fracture which could be due to culprit for upper back/rib cage pain.   Assessment & Plan:   Principal Problem:   PNA (pneumonia) Active Problems:   Interstitial lung disease (HCC)   Chronic respiratory failure with hypoxia (HCC)   Anxiety and depression   Sarcoidosis   Fibromyalgia   Obstructive sleep apnea   Chronic pain syndrome   Hypothyroidism   Adrenal insufficiency (Addison's disease) (HCC)   Depression, major, single episode, moderate (HCC)  1-left lung pneumonia - Will continue treatment with IV ceftriaxone , azithromycin , supportive care , mucolytic's, bronchodilator therapy--> will complete 5 days of antibiotics. -Chronic respiratory failure on home oxygen  - CTA 12/18 with negative for PE/consolidation.  Chest x-ray 12/22 concerning for pneumonia  2-acute on chronic low back/mid back pain.  -Patient has history of lumbar compression vertebral fractures in the past.  Per husband, she has been  basically bedridden for the past 2 to 3 weeks due to severity of pain. -MRI of thoracolumbar spine obtained 12/23 shows T8 compression fracture which is new.  Chronic L1/L2 compression fracture without severe cervical stenosis. Neurosurgery was consulted 12/24 who recommended TLSO brace and outpatient follow-up. -Working on Programme Researcher, Broadcasting/film/video.  ?  Monday. -Will continue pain management.  Unfortunately it is not controlled.  Patient says Dilaudid  worked better than oxycodone .  Switched to Dilaudid .  Switch to every 3 hours as needed - Will continue Robaxin  as needed,lidocaine  patch etc. lidocaine  ordered.   - PT/OT as tolerated.  2-Addison's disease - Received 2/20 steroid, will discontinue Solu-Cortef  today.  Will order home dose hydrocortisone  20 mg 8 AM and 10 mg noon - Continue midodrine  and nasal calcitonin - Continue following electrolytes stability and maintain adequate hydration.   3-chronic diastolic heart failure - Stable and compensated - Continue home dose torsemide .   4-prolonged QT - Magnesium  within normal limits - Follow potassium and replete as needed - Continue minimizing agents that can further prolong QT.   5-chronic respiratory failure, interstitial lung disease and history of sarcoidosis/obstructive sleep apnea - Patient is chronically on 5 L supplementation - Continue to maintain oxygen  saturation above 90% - Continue bronchodilator management.     7-depression/anxiety - Continue BuSpar  - No suicidal ideation or hallucination.   8-hypothyroidism - Continue Synthroid .   DVT prophylaxis: Eliquis  Code Status: Full code. Family Communication: none Disposition: Home with home health 12/26.   Status is: Inpatient Remains inpatient appropriate because: Continue IV therapy.  Pain control   Subjective:  No acute issues overnight.  Continues to complain of pain.  Respiratory status is stable. Waiting for TLSO brace  Objective: Vitals:   08/21/24 1720  08/21/24 2012 08/22/24 0451 08/22/24 0944  BP: 110/62 (!) 113/39 (!) 109/42 (!) 107/40  Pulse: (!) 54 65 73 69  Resp: 18 16 16 18   Temp: 98 F (36.7 C) 97.7 F (36.5 C) (!) 97.5 F (36.4 C) 97.9 F (36.6 C)  TempSrc: Oral Oral Oral Oral  SpO2:  99% 100% 99%  Weight:      Height:        Intake/Output Summary (Last 24 hours) at 08/22/2024 1606 Last data filed at 08/22/2024 0800 Gross per 24 hour  Intake 236 ml  Output --  Net 236 ml   Filed Weights   08/16/24 1619  Weight: 108.9 kg    Examination:  General: Patient is laying on her left side today, not in any acute distress.   HEENT: Moist oral mucosa Chest: Clear to auscultation bilaterally, diminished bilaterally, no wheezes CVs: S1, S2, no murmur, regular rhythm Abdomen: Soft, nontender to palpation  Data Reviewed: I have personally reviewed following labs and imaging studies  CBC: Recent Labs  Lab 08/16/24 1642 08/17/24 0458  WBC 10.7* 11.0*  HGB 9.3* 9.4*  HCT 30.3* 30.4*  MCV 88.6 88.4  PLT 423* 420*   Basic Metabolic Panel: Recent Labs  Lab 08/16/24 1642 08/17/24 0458  NA 139 141  K 3.5 3.8  CL 94* 98  CO2 31 38*  GLUCOSE 111* 106*  BUN 9 8  CREATININE 0.89 0.65  CALCIUM  9.8 9.8  MG 2.4  --    GFR: Estimated Creatinine Clearance: 98.7 mL/min (by C-G formula based on SCr of 0.65 mg/dL). Liver Function the left f: Recent Labs  Lab 08/16/24 1642  AST 19  ALT 12  ALKPHOS 93  BILITOT 0.3  PROT 6.9  ALBUMIN 3.8   No results for input(s): LIPASE, AMYLASE in the last 168 hours. No results for input(s): AMMONIA  in the last 168 hours. Coagulation Profile: No results for input(s): INR, PROTIME in the last 168 hours. Cardiac Enzymes: No results for input(s): CKTOTAL, CKMB, CKMBINDEX, TROPONINI in the last 168 hours. BNP (last 3 results) Recent Labs    05/30/24 2251 08/16/24 1642  PROBNP 192.0 87.8   HbA1C: No results for input(s): HGBA1C in the last 72  hours. CBG: Recent Labs  Lab 08/18/24 0250  GLUCAP 161*   Lipid Profile: No results for input(s): CHOL, HDL, LDLCALC, TRIG, CHOLHDL, LDLDIRECT in the last 72 hours. Thyroid  Function Tests: No results for input(s): TSH, T4TOTAL, FREET4, T3FREE, THYROIDAB in the last 72 hours. Anemia Panel: No results for input(s): VITAMINB12, FOLATE, FERRITIN, TIBC, IRON , RETICCTPCT in the last 72 hours. Sepsis Labs: Recent Labs  Lab 08/16/24 1800  LATICACIDVEN 1.3    Recent Results (from the past 240 hours)  Resp panel by RT-PCR (RSV, Flu A&B, Covid) Anterior Nasal Swab     Status: None   Collection Time: 08/16/24  4:27 PM   Specimen: Anterior Nasal Swab  Result Value Ref Range Status   SARS Coronavirus 2 by RT PCR NEGATIVE NEGATIVE Final    Comment: (NOTE) SARS-CoV-2 target nucleic acids are NOT DETECTED.  The SARS-CoV-2 RNA is generally detectable in upper respiratory specimens during the acute phase of infection. The lowest concentration of SARS-CoV-2 viral copies this assay can detect is 138 copies/mL. A negative result does not preclude SARS-Cov-2 infection and should not be used as the sole basis for treatment or  other patient management decisions. A negative result may occur with  improper specimen collection/handling, submission of specimen other than nasopharyngeal swab, presence of viral mutation(s) within the areas targeted by this assay, and inadequate number of viral copies(<138 copies/mL). A negative result must be combined with clinical observations, patient history, and epidemiological information. The expected result is Negative.  Fact Sheet for Patients:  bloggercourse.com  Fact Sheet for Healthcare Providers:  seriousbroker.it  This test is no t yet approved or cleared by the [place]  FDA and  has been authorized for detection and/or diagnosis of SARS-CoV-2 by FDA under an  Emergency Use Authorization (EUA). This EUA will remain  in effect (meaning this test can be used) for the duration of the COVID-19 declaration under Section 564(b)(1) of the Act, 21 U.S.C.section 360bbb-3(b)(1), unless the authorization is terminated  or revoked sooner.       Influenza A by PCR NEGATIVE NEGATIVE Final   Influenza B by PCR NEGATIVE NEGATIVE Final    Comment: (NOTE) The Xpert Xpress SARS-CoV-2/FLU/RSV plus assay is intended as an aid in the diagnosis of influenza from Nasopharyngeal swab specimens and should not be used as a sole basis for treatment. Nasal washings and aspirates are unacceptable for Xpert Xpress SARS-CoV-2/FLU/RSV testing.  Fact Sheet for Patients: bloggercourse.com  Fact Sheet for Healthcare Providers: seriousbroker.it  This test is not yet approved or cleared by the [place]  FDA and has been authorized for detection and/or diagnosis of SARS-CoV-2 by FDA under an Emergency Use Authorization (EUA). This EUA will remain in effect (meaning this test can be used) for the duration of the COVID-19 declaration under Section 564(b)(1) of the Act, 21 U.S.C. section 360bbb-3(b)(1), unless the authorization is terminated or revoked.     Resp Syncytial Virus by PCR NEGATIVE NEGATIVE Final    Comment: (NOTE) Fact Sheet for Patients: bloggercourse.com  Fact Sheet for Healthcare Providers: seriousbroker.it  This test is not yet approved or cleared by the [place]  FDA and has been authorized for detection and/or diagnosis of SARS-CoV-2 by FDA under an Emergency Use Authorization (EUA). This EUA will remain in effect (meaning this test can be used) for the duration of the COVID-19 declaration under Section 564(b)(1) of the Act, 21 U.S.C. section 360bbb-3(b)(1), unless the authorization is terminated or revoked.  Performed at [facility],  [address] [place], [place] [address]   Blood culture (routine x 2)     Status: None   Collection Time: 08/16/24  5:53 PM   Specimen: BLOOD RIGHT HAND  Result Value Ref Range Status   Specimen Description BLOOD RIGHT HAND  Final   Special Requests AEROBIC BOTTLE ONLY Blood Culture adequate volume  Final   Culture   Final    NO GROWTH 5 DAYS Performed at [facility], [address], [place], [place] [address]    Report Status 08/21/2024 FINAL  Final  Blood culture (routine x 2)     Status: None   Collection Time: 08/16/24  6:00 PM   Specimen: Left Antecubital; Blood  Result Value Ref Range Status   Specimen Description LEFT ANTECUBITAL  Final   Special Requests   Final    BOTTLES DRAWN AEROBIC AND ANAEROBIC Blood Culture adequate volume   Culture   Final    NO GROWTH 5 DAYS Performed at [facility], [address], [place], [place] [address]    Report Status 08/21/2024 FINAL  Final         Radiology Studies: No results found.  Scheduled Meds:  apixaban   5 mg Oral BID   busPIRone   15 mg Oral TID   calcitonin (salmon)  1 spray Alternating Nares Daily   hydrocortisone  sod succinate (SOLU-CORTEF ) inj  50 mg Intravenous Q12H   levothyroxine   50 mcg Oral QAC breakfast   lidocaine   1 patch Transdermal Q24H   methocarbamol   750 mg Oral Q8H   midodrine   10 mg Oral TID WC   pantoprazole   40 mg Oral BID   revefenacin   175 mcg Nebulization Daily   senna-docusate  1 tablet Oral BID   topiramate   50 mg Oral Q lunch   torsemide   40 mg Oral Daily   Continuous Infusions:          Derryl Duval, MD [facility] 08/22/2024, 4:06 PM   "

## 2024-08-22 NOTE — Plan of Care (Signed)

## 2024-08-23 DIAGNOSIS — S22060A Wedge compression fracture of T7-T8 vertebra, initial encounter for closed fracture: Secondary | ICD-10-CM | POA: Diagnosis not present

## 2024-08-23 DIAGNOSIS — E271 Primary adrenocortical insufficiency: Secondary | ICD-10-CM | POA: Diagnosis not present

## 2024-08-23 DIAGNOSIS — J189 Pneumonia, unspecified organism: Secondary | ICD-10-CM | POA: Diagnosis not present

## 2024-08-23 MED ORDER — HYDROMORPHONE HCL 1 MG/ML IJ SOLN
1.0000 mg | INTRAMUSCULAR | Status: DC | PRN
  Administered 2024-08-23 – 2024-08-24 (×5): 1 mg via INTRAVENOUS
  Filled 2024-08-23 (×3): qty 1

## 2024-08-23 MED ORDER — HYDROMORPHONE HCL 2 MG PO TABS
2.0000 mg | ORAL_TABLET | ORAL | Status: DC | PRN
  Administered 2024-08-23 (×3): 2 mg via ORAL
  Filled 2024-08-23 (×3): qty 1

## 2024-08-23 NOTE — Progress Notes (Signed)
 " PROGRESS NOTE    Kathleen [name]  FMW:[id number] DOB: 06/20/1961 DOA: 08/16/2024 PCP: Alphonsa Glendia LABOR, MD   Brief Narrative:  Kathleen [name] is a 63 y.o. female with medical history significant for chronic respiratory failure on 5 L, Addison's disease, CHF, hypothyroidism, ILD and OSA. Patient presented to the ED with complaints of congestion, chest pain, cough and difficulty breathing over the past several days.  She was in the ED with similar complaints 12/18, CTA chest was negative for PE, and troponins EKG were unremarkable. Patient also having severe pain in upper back, left rib cage under her breast. Husband reported left mid back/low back pain ongoing for past couple of weeks.  She says she has been basically bedridden due to severity of the pain.  Was initially admitted at [facility] hospital for treatment of pneumonia however patient and family was adamant about transfer to [facility] given back pain issues.  Has been treated with IV antibiotics for pneumonia.  MRI shows new T8 fracture which could be due to culprit for upper back/rib cage pain.   Assessment & Plan:   Principal Problem:   PNA (pneumonia) Active Problems:   Interstitial lung disease (HCC)   Chronic respiratory failure with hypoxia (HCC)   Anxiety and depression   Sarcoidosis   Fibromyalgia   Obstructive sleep apnea   Chronic pain syndrome   Hypothyroidism   Adrenal insufficiency (Addison's disease) (HCC)   Depression, major, single episode, moderate (HCC)  1-left lung pneumonia - Completed antibiotic therapy. Patient is on chronic home oxygen  5 L/min  2-acute on chronic low back/mid back pain.  -Patient with multiple lumbar compression fractures in the past and on chronic opiate therapy. -Acute upper back pain, found to have T8 compression fracture which is new. -Currently on Robaxin  scheduled, lidocaine  patch, on IV as well as p.o. Dilaudid  as needed. - Patient continues to complain of significant pain.  Will  increase Dilaudid  to 2 mg every 4 hours as needed, IV Dilaudid  1 mg every 3 hours as needed for breakthrough pain. - Evaluated by neurosurgery.  No surgery recommended.  Patient need to follow-up with her primary outpatient spine surgery - Awaiting TLSO brace  -PT/OT  2-Addison's disease - Received stress dose steroids.  Back to home dose hydrocortisone  20 mg 8 AM and 10 mg noon - Continue midodrine  and nasal calcitonin - Continue following electrolytes stability and maintain adequate hydration.   3-chronic diastolic heart failure - Stable and compensated - Continue home dose torsemide .   4-prolonged QT - Magnesium  within normal limits - Follow potassium and replete as needed - Continue minimizing agents that can further prolong QT.   5-chronic respiratory failure, interstitial lung disease and history of sarcoidosis/obstructive sleep apnea - Patient is chronically on 5 L supplementation - Continue to maintain oxygen  saturation above 90% - Continue bronchodilator management.     7-depression/anxiety - Continue BuSpar  - No suicidal ideation or hallucination.   8-hypothyroidism - Continue Synthroid .   DVT prophylaxis: Eliquis  Code Status: Full code. Family Communication: none Disposition: Home with home health 12/26.   Status is: Inpatient Remains inpatient appropriate because: Continue IV therapy.  Pain control   Subjective:  Continues to complain of pain, says current dose of Dilaudid  is not working.  Respiratory status is stable. Waiting for TLSO brace  Objective: Vitals:   08/22/24 1803 08/23/24 0516 08/23/24 0920 08/23/24 0944  BP: (!) 113/40 (!) 114/55  (!) 110/40  Pulse: 80 80 77 68  Resp: 18 17 17  16  Temp: (!) 97.5 F (36.4 C) 98.1 F (36.7 C)  97.8 F (36.6 C)  TempSrc:  Oral  Oral  SpO2: 100% 99% 99% 100%  Weight:      Height:        Intake/Output Summary (Last 24 hours) at 08/23/2024 1438 Last data filed at 08/23/2024 0800 Gross per 24 hour   Intake 240 ml  Output --  Net 240 ml   Filed Weights   08/16/24 1619  Weight: 108.9 kg    Examination:  General: Patient is laying on her right side today, not in any acute distress.   HEENT: Moist oral mucosa Chest: Clear to auscultation bilaterally, diminished bilaterally, no wheezes CVs: S1, S2, no murmur, regular rhythm Abdomen: Soft, nontender to palpation  Data Reviewed: I have personally reviewed following labs and imaging studies  CBC: Recent Labs  Lab 08/16/24 1642 08/17/24 0458  WBC 10.7* 11.0*  HGB 9.3* 9.4*  HCT 30.3* 30.4*  MCV 88.6 88.4  PLT 423* 420*   Basic Metabolic Panel: Recent Labs  Lab 08/16/24 1642 08/17/24 0458  NA 139 141  K 3.5 3.8  CL 94* 98  CO2 31 38*  GLUCOSE 111* 106*  BUN 9 8  CREATININE 0.89 0.65  CALCIUM  9.8 9.8  MG 2.4  --    GFR: Estimated Creatinine Clearance: 98.7 mL/min (by C-G formula based on SCr of 0.65 mg/dL). Liver Function the left f: Recent Labs  Lab 08/16/24 1642  AST 19  ALT 12  ALKPHOS 93  BILITOT 0.3  PROT 6.9  ALBUMIN 3.8   No results for input(s): LIPASE, AMYLASE in the last 168 hours. No results for input(s): AMMONIA  in the last 168 hours. Coagulation Profile: No results for input(s): INR, PROTIME in the last 168 hours. Cardiac Enzymes: No results for input(s): CKTOTAL, CKMB, CKMBINDEX, TROPONINI in the last 168 hours. BNP (last 3 results) Recent Labs    05/30/24 2251 08/16/24 1642  PROBNP 192.0 87.8   HbA1C: No results for input(s): HGBA1C in the last 72 hours. CBG: Recent Labs  Lab 08/18/24 0250  GLUCAP 161*   Lipid Profile: No results for input(s): CHOL, HDL, LDLCALC, TRIG, CHOLHDL, LDLDIRECT in the last 72 hours. Thyroid  Function Tests: No results for input(s): TSH, T4TOTAL, FREET4, T3FREE, THYROIDAB in the last 72 hours. Anemia Panel: No results for input(s): VITAMINB12, FOLATE, FERRITIN, TIBC, IRON , RETICCTPCT in the last  72 hours. Sepsis Labs: Recent Labs  Lab 08/16/24 1800  LATICACIDVEN 1.3    Recent Results (from the past 240 hours)  Resp panel by RT-PCR (RSV, Flu A&B, Covid) Anterior Nasal Swab     Status: None   Collection Time: 08/16/24  4:27 PM   Specimen: Anterior Nasal Swab  Result Value Ref Range Status   SARS Coronavirus 2 by RT PCR NEGATIVE NEGATIVE Final    Comment: (NOTE) SARS-CoV-2 target nucleic acids are NOT DETECTED.  The SARS-CoV-2 RNA is generally detectable in upper respiratory specimens during the acute phase of infection. The lowest concentration of SARS-CoV-2 viral copies this assay can detect is 138 copies/mL. A negative result does not preclude SARS-Cov-2 infection and should not be used as the sole basis for treatment or other patient management decisions. A negative result may occur with  improper specimen collection/handling, submission of specimen other than nasopharyngeal swab, presence of viral mutation(s) within the areas targeted by this assay, and inadequate number of viral copies(<138 copies/mL). A negative result must be combined with clinical observations, patient history, and  epidemiological information. The expected result is Negative.  Fact Sheet for Patients:  bloggercourse.com  Fact Sheet for Healthcare Providers:  seriousbroker.it  This test is no t yet approved or cleared by the [place]  FDA and  has been authorized for detection and/or diagnosis of SARS-CoV-2 by FDA under an Emergency Use Authorization (EUA). This EUA will remain  in effect (meaning this test can be used) for the duration of the COVID-19 declaration under Section 564(b)(1) of the Act, 21 U.S.C.section 360bbb-3(b)(1), unless the authorization is terminated  or revoked sooner.       Influenza A by PCR NEGATIVE NEGATIVE Final   Influenza B by PCR NEGATIVE NEGATIVE Final    Comment: (NOTE) The Xpert Xpress SARS-CoV-2/FLU/RSV  plus assay is intended as an aid in the diagnosis of influenza from Nasopharyngeal swab specimens and should not be used as a sole basis for treatment. Nasal washings and aspirates are unacceptable for Xpert Xpress SARS-CoV-2/FLU/RSV testing.  Fact Sheet for Patients: bloggercourse.com  Fact Sheet for Healthcare Providers: seriousbroker.it  This test is not yet approved or cleared by the [place]  FDA and has been authorized for detection and/or diagnosis of SARS-CoV-2 by FDA under an Emergency Use Authorization (EUA). This EUA will remain in effect (meaning this test can be used) for the duration of the COVID-19 declaration under Section 564(b)(1) of the Act, 21 U.S.C. section 360bbb-3(b)(1), unless the authorization is terminated or revoked.     Resp Syncytial Virus by PCR NEGATIVE NEGATIVE Final    Comment: (NOTE) Fact Sheet for Patients: bloggercourse.com  Fact Sheet for Healthcare Providers: seriousbroker.it  This test is not yet approved or cleared by the [place]  FDA and has been authorized for detection and/or diagnosis of SARS-CoV-2 by FDA under an Emergency Use Authorization (EUA). This EUA will remain in effect (meaning this test can be used) for the duration of the COVID-19 declaration under Section 564(b)(1) of the Act, 21 U.S.C. section 360bbb-3(b)(1), unless the authorization is terminated or revoked.  Performed at [facility], [address] [place], [place] [address]   Blood culture (routine x 2)     Status: None   Collection Time: 08/16/24  5:53 PM   Specimen: BLOOD RIGHT HAND  Result Value Ref Range Status   Specimen Description BLOOD RIGHT HAND  Final   Special Requests AEROBIC BOTTLE ONLY Blood Culture adequate volume  Final   Culture   Final    NO GROWTH 5 DAYS Performed at [facility], [address], [place], [place] [address]    Report  Status 08/21/2024 FINAL  Final  Blood culture (routine x 2)     Status: None   Collection Time: 08/16/24  6:00 PM   Specimen: Left Antecubital; Blood  Result Value Ref Range Status   Specimen Description LEFT ANTECUBITAL  Final   Special Requests   Final    BOTTLES DRAWN AEROBIC AND ANAEROBIC Blood Culture adequate volume   Culture   Final    NO GROWTH 5 DAYS Performed at [facility], [address], [place], [place] [address]    Report Status 08/21/2024 FINAL  Final         Radiology Studies: No results found.       Scheduled Meds:  apixaban   5 mg Oral BID   busPIRone   15 mg Oral TID   calcitonin (salmon)  1 spray Alternating Nares Daily   ferrous sulfate   325 mg Oral Weekly   folic acid   1 mg Oral Daily  gabapentin   300 mg Oral QID   hydrocortisone   10 mg Oral Q1200   hydrocortisone   10 mg Oral Once   hydrocortisone   20 mg Oral Daily   levothyroxine   50 mcg Oral QAC breakfast   lidocaine   1 patch Transdermal Q24H   linaclotide   145 mcg Oral QAC breakfast   methocarbamol   750 mg Oral Q8H   midodrine   10 mg Oral TID WC   pantoprazole   40 mg Oral BID   potassium chloride   10 mEq Oral BID   revefenacin   175 mcg Nebulization Daily   rosuvastatin   20 mg Oral Daily   senna-docusate  1 tablet Oral BID   topiramate   50 mg Oral Q lunch   torsemide   40 mg Oral Daily   Vitamin D  (Ergocalciferol )  50,000 Units Oral Q Sun   Continuous Infusions:          Derryl Duval, MD [facility] 08/23/2024, 2:38 PM   "

## 2024-08-24 ENCOUNTER — Encounter: Payer: Self-pay | Admitting: *Deleted

## 2024-08-24 DIAGNOSIS — J189 Pneumonia, unspecified organism: Secondary | ICD-10-CM | POA: Diagnosis not present

## 2024-08-24 DIAGNOSIS — S22060A Wedge compression fracture of T7-T8 vertebra, initial encounter for closed fracture: Secondary | ICD-10-CM | POA: Diagnosis not present

## 2024-08-24 DIAGNOSIS — E271 Primary adrenocortical insufficiency: Secondary | ICD-10-CM | POA: Diagnosis not present

## 2024-08-24 MED ORDER — LORAZEPAM 0.5 MG PO TABS
0.5000 mg | ORAL_TABLET | Freq: Two times a day (BID) | ORAL | Status: AC | PRN
  Administered 2024-08-24 – 2024-08-26 (×4): 0.5 mg via ORAL
  Filled 2024-08-24 (×4): qty 1

## 2024-08-24 MED ORDER — HYDROMORPHONE HCL 1 MG/ML IJ SOLN
1.0000 mg | INTRAMUSCULAR | Status: DC | PRN
  Administered 2024-08-24: 1 mg via INTRAVENOUS
  Filled 2024-08-24: qty 1

## 2024-08-24 MED ORDER — ORAL CARE MOUTH RINSE: 15.0000 mL | OROMUCOSAL | Status: DC | PRN

## 2024-08-24 MED ORDER — HYDROMORPHONE HCL 2 MG PO TABS
2.0000 mg | ORAL_TABLET | ORAL | Status: DC | PRN
  Filled 2024-08-24: qty 1

## 2024-08-24 MED ORDER — HYDROMORPHONE HCL 1 MG/ML IJ SOLN
1.0000 mg | INTRAMUSCULAR | Status: DC | PRN
  Administered 2024-08-24 – 2024-08-25 (×6): 1 mg via INTRAVENOUS
  Filled 2024-08-24 (×6): qty 1

## 2024-08-24 MED ORDER — HYDROMORPHONE HCL 2 MG PO TABS
2.0000 mg | ORAL_TABLET | ORAL | Status: DC | PRN
  Administered 2024-08-24 – 2024-08-26 (×10): 2 mg via ORAL
  Filled 2024-08-24 (×10): qty 1

## 2024-08-24 NOTE — Progress Notes (Addendum)
 PT Cancellation Note  Patient Details Name: Kathleen [name] MRN: [id number] DOB: December 17, 1960   Cancelled Treatment:    Reason Eval/Treat Not Completed: Medical issues which prohibited therapy (Still awaiting TLSO.  Have called [facility] x 2 today and brace still not delivered.)Wasn't in stock per [facility] and they are hopeful to get it in am.  Pt refusing to get OOB without the brace. Will return tomorrow and proceed as able.    [name] 08/24/2024, 3:34 PM Terisa Belardo M,PT Acute Rehab Services [phone]

## 2024-08-24 NOTE — Progress Notes (Signed)
 PT Cancellation Note  Patient Details Name: Kathleen [name] MRN: [id number] DOB: [birth year]   Cancelled Treatment:    Reason Eval/Treat Not Completed: Other (comment) (Called [facility] and brace has not come in yet as it was special ordered. They stated they will call this PT when it arrives.)   [name] 08/24/2024, 11:04 AM Gift Rueckert M,PT Acute Rehab Services [phone]

## 2024-08-24 NOTE — Progress Notes (Signed)
 " PROGRESS NOTE    Kathleen [name]  FMW:[id number] DOB: 1960-12-06 DOA: 08/16/2024 PCP: Alphonsa Glendia LABOR, MD   Brief Narrative:  Kathleen [name] is a 63 y.o. female with medical history significant for chronic respiratory failure on 5 L, Addison's disease, CHF, hypothyroidism, ILD and OSA. Patient presented to the ED with complaints of congestion, chest pain, cough and difficulty breathing over the past several days.  She was in the ED with similar complaints 12/18, CTA chest was negative for PE, and troponins EKG were unremarkable. Patient also having severe pain in upper back, left rib cage under her breast. Husband reported left mid back/low back pain ongoing for past couple of weeks.  She says she has been basically bedridden due to severity of the pain.  Was initially admitted at [facility] hospital for treatment of pneumonia however patient and family was adamant about transfer to [facility] given back pain issues.  Has been treated with IV antibiotics for pneumonia.  MRI shows new T8 fracture which could be due to culprit for upper back/rib cage pain.  Ongoing pain management.   Assessment & Plan:   Principal Problem:   PNA (pneumonia) Active Problems:   Interstitial lung disease (HCC)   Chronic respiratory failure with hypoxia (HCC)   Anxiety and depression   Sarcoidosis   Fibromyalgia   Obstructive sleep apnea   Chronic pain syndrome   Hypothyroidism   Adrenal insufficiency (Addison's disease) (HCC)   Depression, major, single episode, moderate (HCC)  1-left lung pneumonia - Completed antibiotic therapy. Patient is on chronic home oxygen  5 L/min  2-acute on chronic low back/mid back pain.  -Patient with multiple lumbar compression fractures in the past and on chronic opiate therapy. -Acute upper back pain, found to have T8 compression fracture which is new. -Currently on Robaxin  scheduled, lidocaine  patch, on IV as well as p.o. Dilaudid  as needed. - Patient continues to complain of  significant pain.  Continue Dilaudid  IV and p.o. as needed.  Robaxin , lidocaine . - Evaluated by neurosurgery.  No surgery recommended.  Patient need to follow-up with her primary outpatient spine surgery - Awaiting TLSO brace from [facility] -PT/OT  2-Addison's disease - Received stress dose steroids.  Back to home dose hydrocortisone  20 mg 8 AM and 10 mg noon - Continue midodrine  and nasal calcitonin - Continue following electrolytes stability and maintain adequate hydration.   3-chronic diastolic heart failure - Stable and compensated - Continue home dose torsemide .   4-prolonged QT - Magnesium  within normal limits - Follow potassium and replete as needed - Continue minimizing agents that can further prolong QT.   5-chronic respiratory failure, interstitial lung disease and history of sarcoidosis/obstructive sleep apnea - Patient is chronically on 5 L supplementation - Continue to maintain oxygen  saturation above 90% - Continue bronchodilator management.     7-depression/anxiety - Continue BuSpar  - No suicidal ideation or hallucination.   8-hypothyroidism - Continue Synthroid .   DVT prophylaxis: Eliquis  Code Status: Full code. Family Communication: none Disposition: Home with home health 12/26.   Status is: Inpatient Remains inpatient appropriate because: Continue IV therapy.  Pain control   Subjective:  Continues to complain of pain, tearful.  Respiratory status is stable. Waiting for TLSO brace  Objective: Vitals:   08/23/24 2112 08/24/24 0549 08/24/24 0906 08/24/24 0919  BP: (!) 118/55 (!) 99/39  (!) 111/46  Pulse: 88 71  88  Resp: 16 15  18   Temp: 97.6 F (36.4 C) 98.4 F (36.9 C)  98.1 F (36.7  C)  TempSrc: Oral Oral  Oral  SpO2: 100% 100% 100% 100%  Weight:      Height:       No intake or output data in the 24 hours ending 08/24/24 1328  Filed Weights   08/16/24 1619  Weight: 108.9 kg    Examination:  General: Patient is laying on her right  side today, not in any acute distress.   HEENT: Moist oral mucosa Chest: Clear to auscultation bilaterally, diminished bilaterally, no wheezes CVs: S1, S2, no murmur, regular rhythm Abdomen: Soft, nontender to palpation  Data Reviewed: I have personally reviewed following labs and imaging studies  CBC: No results for input(s): WBC, NEUTROABS, HGB, HCT, MCV, PLT in the last 168 hours.  Basic Metabolic Panel: No results for input(s): NA, K, CL, CO2, GLUCOSE, BUN, CREATININE, CALCIUM , MG, PHOS in the last 168 hours.  GFR: Estimated Creatinine Clearance: 98.7 mL/min (by C-G formula based on SCr of 0.65 mg/dL). Liver Function the left f: No results for input(s): AST, ALT, ALKPHOS, BILITOT, PROT, ALBUMIN in the last 168 hours.  No results for input(s): LIPASE, AMYLASE in the last 168 hours. No results for input(s): AMMONIA  in the last 168 hours. Coagulation Profile: No results for input(s): INR, PROTIME in the last 168 hours. Cardiac Enzymes: No results for input(s): CKTOTAL, CKMB, CKMBINDEX, TROPONINI in the last 168 hours. BNP (last 3 results) Recent Labs    05/30/24 2251 08/16/24 1642  PROBNP 192.0 87.8   HbA1C: No results for input(s): HGBA1C in the last 72 hours. CBG: Recent Labs  Lab 08/18/24 0250  GLUCAP 161*   Lipid Profile: No results for input(s): CHOL, HDL, LDLCALC, TRIG, CHOLHDL, LDLDIRECT in the last 72 hours. Thyroid  Function Tests: No results for input(s): TSH, T4TOTAL, FREET4, T3FREE, THYROIDAB in the last 72 hours. Anemia Panel: No results for input(s): VITAMINB12, FOLATE, FERRITIN, TIBC, IRON , RETICCTPCT in the last 72 hours. Sepsis Labs: No results for input(s): PROCALCITON, LATICACIDVEN in the last 168 hours.   Recent Results (from the past 240 hours)  Resp panel by RT-PCR (RSV, Flu A&B, Covid) Anterior Nasal Swab     Status: None   Collection Time:  08/16/24  4:27 PM   Specimen: Anterior Nasal Swab  Result Value Ref Range Status   SARS Coronavirus 2 by RT PCR NEGATIVE NEGATIVE Final    Comment: (NOTE) SARS-CoV-2 target nucleic acids are NOT DETECTED.  The SARS-CoV-2 RNA is generally detectable in upper respiratory specimens during the acute phase of infection. The lowest concentration of SARS-CoV-2 viral copies this assay can detect is 138 copies/mL. A negative result does not preclude SARS-Cov-2 infection and should not be used as the sole basis for treatment or other patient management decisions. A negative result may occur with  improper specimen collection/handling, submission of specimen other than nasopharyngeal swab, presence of viral mutation(s) within the areas targeted by this assay, and inadequate number of viral copies(<138 copies/mL). A negative result must be combined with clinical observations, patient history, and epidemiological information. The expected result is Negative.  Fact Sheet for Patients:  bloggercourse.com  Fact Sheet for Healthcare Providers:  seriousbroker.it  This test is no t yet approved or cleared by the [place]  FDA and  has been authorized for detection and/or diagnosis of SARS-CoV-2 by FDA under an Emergency Use Authorization (EUA). This EUA will remain  in effect (meaning this test can be used) for the duration of the COVID-19 declaration under Section 564(b)(1) of the Act, 21 U.S.C.section 360bbb-3(b)(1), unless  the authorization is terminated  or revoked sooner.       Influenza A by PCR NEGATIVE NEGATIVE Final   Influenza B by PCR NEGATIVE NEGATIVE Final    Comment: (NOTE) The Xpert Xpress SARS-CoV-2/FLU/RSV plus assay is intended as an aid in the diagnosis of influenza from Nasopharyngeal swab specimens and should not be used as a sole basis for treatment. Nasal washings and aspirates are unacceptable for Xpert Xpress  SARS-CoV-2/FLU/RSV testing.  Fact Sheet for Patients: bloggercourse.com  Fact Sheet for Healthcare Providers: seriousbroker.it  This test is not yet approved or cleared by the [place]  FDA and has been authorized for detection and/or diagnosis of SARS-CoV-2 by FDA under an Emergency Use Authorization (EUA). This EUA will remain in effect (meaning this test can be used) for the duration of the COVID-19 declaration under Section 564(b)(1) of the Act, 21 U.S.C. section 360bbb-3(b)(1), unless the authorization is terminated or revoked.     Resp Syncytial Virus by PCR NEGATIVE NEGATIVE Final    Comment: (NOTE) Fact Sheet for Patients: bloggercourse.com  Fact Sheet for Healthcare Providers: seriousbroker.it  This test is not yet approved or cleared by the [place]  FDA and has been authorized for detection and/or diagnosis of SARS-CoV-2 by FDA under an Emergency Use Authorization (EUA). This EUA will remain in effect (meaning this test can be used) for the duration of the COVID-19 declaration under Section 564(b)(1) of the Act, 21 U.S.C. section 360bbb-3(b)(1), unless the authorization is terminated or revoked.  Performed at [facility], [address], [place], [place] [address]   Blood culture (routine x 2)     Status: None   Collection Time: 08/16/24  5:53 PM   Specimen: BLOOD RIGHT HAND  Result Value Ref Range Status   Specimen Description BLOOD RIGHT HAND  Final   Special Requests AEROBIC BOTTLE ONLY Blood Culture adequate volume  Final   Culture   Final    NO GROWTH 5 DAYS Performed at [facility], [address], [place], [place] [address]    Report Status 08/21/2024 FINAL  Final  Blood culture (routine x 2)     Status: None   Collection Time: 08/16/24  6:00 PM   Specimen: Left Antecubital; Blood  Result Value Ref Range Status   Specimen Description LEFT  ANTECUBITAL  Final   Special Requests   Final    BOTTLES DRAWN AEROBIC AND ANAEROBIC Blood Culture adequate volume   Culture   Final    NO GROWTH 5 DAYS Performed at [facility], [address] [place], [place] [address]    Report Status 08/21/2024 FINAL  Final         Radiology Studies: No results found.       Scheduled Meds:  apixaban   5 mg Oral BID   busPIRone   15 mg Oral TID   calcitonin (salmon)  1 spray Alternating Nares Daily   ferrous sulfate   325 mg Oral Weekly   folic acid   1 mg Oral Daily   gabapentin   300 mg Oral QID   hydrocortisone   10 mg Oral Q1200   hydrocortisone   10 mg Oral Once   hydrocortisone   20 mg Oral Daily   levothyroxine   50 mcg Oral QAC breakfast   lidocaine   1 patch Transdermal Q24H   linaclotide   145 mcg Oral QAC breakfast   methocarbamol   750 mg Oral Q8H   midodrine   10 mg Oral TID WC   pantoprazole   40 mg Oral BID   potassium chloride   10 mEq Oral BID   revefenacin   175 mcg Nebulization Daily   rosuvastatin   20 mg Oral Daily   senna-docusate  1 tablet Oral BID   topiramate   50 mg Oral Q lunch   torsemide   40 mg Oral Daily   Vitamin D  (Ergocalciferol )  50,000 Units Oral Q Sun   Continuous Infusions:          Derryl Duval, MD [facility] 08/24/2024, 1:28 PM   "

## 2024-08-24 NOTE — Care Management Important Message (Signed)
 Important Message  Patient Details  Name: Kathleen [name] MRN: [id number] Date of Birth: 1961/04/20   Important Message Given:  Yes - Medicare IM     Jennie Laneta Dragon 08/24/2024, 2:07 PM

## 2024-08-24 NOTE — Plan of Care (Signed)

## 2024-08-24 NOTE — Progress Notes (Signed)
 OT Cancellation Note  Patient Details Name: LAMETRIA KLUNK MRN: [id number] DOB: Jan 01, 1961   Cancelled Treatment:    Reason Eval/Treat Not Completed: Other (comment) Pt awaiting brace from [facility]. OT to follow-up as appropriate and as schedule allows.   Maurilio CROME, OTR/LSABRA  [facility] Acute Rehabilitation  Office: [phone]   Maurilio PARAS Carold Eisner 08/24/2024, 12:15 PM

## 2024-08-25 NOTE — Progress Notes (Signed)
 Occupational Therapy Treatment Patient Details Name: Kathleen [name] MRN: [id number] DOB: 12-11-1960 Today's Date: 08/25/2024   History of present illness 63 yo F adm 12/21 findings for L lung PNA 12/23 reports CP with L arm pain. Recent visit to ED 12/18 for CP, cough and difficulty breathing 12/23 MRI (+) acute T8 compression fx with involvement of pedicles bil, unhealed L1 L5 fx, remote T11 L3 and L4 compression fx, PMH: chronic hypoxic RF (5L O2), ILD, sarcoidosis, Addison's disease, morbid obesity, bilateral hip AVN, osteoporosis with multiple vertebral fractures on chronic narcotics, and admissions for intractable pain d/t compression fractures of L4 (4/12-4/16) and then L3 (5/9-5/14)   OT comments  Pt progressing towards goals. Focus of session on progressing functional mobility and increasing safe engagement in ADL tasks with reinforcement of back precautions to facilitate generalization during task. Pt required up to Mod A +2 for bed mobility this date and up to total A for UB dressing to don brace. OT to continue to follow Pt acutely, continue per POC.       If plan is discharge home, recommend the following:  A little help with walking and/or transfers;A lot of help with bathing/dressing/bathroom;Assistance with cooking/housework;Assist for transportation;Help with stairs or ramp for entrance   Equipment Recommendations  None recommended by OT    Recommendations for Other Services      Precautions / Restrictions Precautions Precautions: Fall;Back Recall of Precautions/Restrictions: Intact Precaution/Restrictions Comments: on 5L at baseline Required Braces or Orthoses: Spinal Brace Spinal Brace: Thoracolumbosacral orthotic Restrictions Weight Bearing Restrictions Per Provider Order: No       Mobility Bed Mobility Overal bed mobility: Needs Assistance Bed Mobility: Rolling, Sidelying to Sit, Sit to Sidelying Rolling: Contact guard assist, Used rails Sidelying to sit: Mod  assist, +2 for physical assistance, Used rails     Sit to sidelying: Min assist General bed mobility comments: Mod A +2 to elevate trunk from sidelying position. Donned brace in sitting position as Pt was unable to tolerate application bed level.    Transfers Overall transfer level: Needs assistance Equipment used: Rolling walker (2 wheels) Transfers: Sit to/from Stand Sit to Stand: Min assist           General transfer comment: Min A to rise from bed with verbal cues for proper hand placement on RW. Pt utilized BUE on RW to power up. CGA during ambulation with RW.     Balance Overall balance assessment: Needs assistance Sitting-balance support: Bilateral upper extremity supported, Feet supported Sitting balance-Leahy Scale: Fair Sitting balance - Comments: Sitting EOB, Pt with strong preference for BUE support laterally   Standing balance support: Bilateral upper extremity supported, During functional activity, Reliant on assistive device for balance Standing balance-Leahy Scale: Poor Standing balance comment: Dependent on RW                           ADL either performed or assessed with clinical judgement   ADL Overall ADL's : Needs assistance/impaired                 Upper Body Dressing : Total assistance Upper Body Dressing Details (indicate cue type and reason): to don TLSO Lower Body Dressing: Total assistance                      Extremity/Trunk Assessment Upper Extremity Assessment Upper Extremity Assessment: Generalized weakness            Vision  Perception     Praxis     Communication Communication Communication: No apparent difficulties   Cognition Arousal: Alert Behavior During Therapy: Anxious Cognition: No apparent impairments             OT - Cognition Comments: Pt with increased pain this session, occassionally self-limiting                 Following commands: Intact        Cueing    Cueing Techniques: Verbal cues, Visual cues, Gestural cues  Exercises      Shoulder Instructions       General Comments VSS on 5L O2. Husband present for session, encouraging Pt and engaged in education regarding brace fit.    Pertinent Vitals/ Pain       Pain Assessment Pain Assessment: Faces Pain Score: 10-Worst pain ever Pain Location: back Pain Descriptors / Indicators: Discomfort, Grimacing, Guarding, Moaning, Sharp Pain Intervention(s): Limited activity within patient's tolerance, Monitored during session, Premedicated before session, Repositioned, Heat applied  Home Living                                          Prior Functioning/Environment              Frequency  Min 2X/week        Progress Toward Goals  OT Goals(current goals can now be found in the care plan section)  Progress towards OT goals: Progressing toward goals  Acute Rehab OT Goals Patient Stated Goal: decrease pain and go home OT Goal Formulation: With patient Time For Goal Achievement: 09/02/24 Potential to Achieve Goals: Good ADL Goals Pt Will Transfer to Toilet: with min assist;bedside commode (with caregiver assisting) Pt Will Perform Toileting - Clothing Manipulation and hygiene: with caregiver independent in assisting;with min assist;with adaptive equipment Additional ADL Goal #1: Pt will verbalize understanding of back precuations for mobility/ADL to reduce pain and increase function  Plan      Co-evaluation    PT/OT/SLP Co-Evaluation/Treatment: Yes Reason for Co-Treatment: To address functional/ADL transfers;For patient/therapist safety   OT goals addressed during session: ADL's and self-care;Proper use of Adaptive equipment and DME      AM-PAC OT 6 Clicks Daily Activity     Outcome Measure   Help from another person eating meals?: None Help from another person taking care of personal grooming?: A Little Help from another person toileting, which  includes using toliet, bedpan, or urinal?: Total Help from another person bathing (including washing, rinsing, drying)?: A Lot Help from another person to put on and taking off regular upper body clothing?: A Lot Help from another person to put on and taking off regular lower body clothing?: Total 6 Click Score: 13    End of Session Equipment Utilized During Treatment: Gait belt;Rolling walker (2 wheels);Back brace  OT Visit Diagnosis: Unsteadiness on feet (R26.81);Other abnormalities of gait and mobility (R26.89);Muscle weakness (generalized) (M62.81);Pain Pain - part of body:  (back)   Activity Tolerance Patient tolerated treatment well   Patient Left in bed;with call bell/phone within reach;with bed alarm set   Nurse Communication Mobility status        Time: 8556-8487 OT Time Calculation (min): 29 min  Charges: OT General Charges $OT Visit: 1 Visit OT Treatments $Therapeutic Activity: 8-22 mins  Maurilio CROME, OTR/L.  [facility] Acute Rehabilitation  Office: [phone]   Maurilio PARAS Navaeh Kehres 08/25/2024, 4:23 PM

## 2024-08-25 NOTE — TOC Initial Note (Signed)
 Transition of Care (TOC) - Initial/Assessment Note   Spoke to patient and husband Cheryl at bedside.   Patient has wheelchair, walker, Rolator and oxygen  at home.   Home Oxygen  is with [employer].   Cheryl can provide transportation home at discharge and bring patient's portable oxygen  DME for her to use on way home.   PT/OT recommending HHPT/OT , both in agreement . Patient has had [facility] in the past and would like them again. NCM entered orders and secure chatted MD to sign.   Darleene with [facility] accepted referral.  Patient Details  Name: Kathleen [name] MRN: [id number] Date of Birth: 1961-03-22  Transition of Care [facility]) CM/SW Contact:    Stephane Powell Jansky, RN Phone Number: 08/25/2024, 4:04 PM  Clinical Narrative:                   Expected Discharge Plan: Home w Home Health Services Barriers to Discharge: Continued Medical Work up   Patient Goals and CMS Choice Patient states their goals for this hospitalization and ongoing recovery are:: to return to home CMS Medicare.gov Compare Post Acute Care list provided to:: Patient Choice offered to / list presented to : Patient      Expected Discharge Plan and Services   Discharge Planning Services: CM Consult Post Acute Care Choice: Home Health Living arrangements for the past 2 months: Single Family Home                 DME Arranged: N/A DME Agency: NA       HH Arranged: PT, OT HH Agency: [facility] Date [facility] Agency Contacted: 08/25/24 Time HH Agency Contacted: 1603 Representative spoke with at [facility] Agency: Darleene  Prior Living Arrangements/Services Living arrangements for the past 2 months: Single Family Home Lives with:: Spouse Patient language and need for interpreter reviewed:: Yes Do you feel safe going back to the place where you live?: Yes      Need for Family Participation in Patient Care: Yes (Comment) Care giver support system in place?: Yes (comment) Current home services: DME Criminal  Activity/Legal Involvement Pertinent to Current Situation/Hospitalization: No - Comment as needed  Activities of Daily Living   ADL Screening (condition at time of admission) Independently performs ADLs?: Yes (appropriate for developmental age) Is the patient deaf or have difficulty hearing?: No Does the patient have difficulty seeing, even when wearing glasses/contacts?: No Does the patient have difficulty concentrating, remembering, or making decisions?: No  Permission Sought/Granted   Permission granted to share information with : Yes, Verbal Permission Granted  Share Information with NAME: spouse Cheryl  Permission granted to share info w AGENCY: [facility]        Emotional Assessment Appearance:: Appears older than stated age Attitude/Demeanor/Rapport: Engaged Affect (typically observed): Appropriate Orientation: : Oriented to Self, Oriented to Place, Oriented to  Time, Oriented to Situation Alcohol / Substance Use: Not Applicable Psych Involvement: No (comment)  Admission diagnosis:  PNA (pneumonia) [J18.9] Patient Active Problem List   Diagnosis Date Noted   PNA (pneumonia) 08/16/2024   Iron  deficiency anemia 07/31/2024   Acute on chronic heart failure with preserved ejection fraction (HFpEF) (HCC) 05/11/2024   UTI (urinary tract infection) 04/23/2024   Osteoporosis with current pathological fracture 03/17/2024   Anxiety 02/11/2024   Spinal compression fracture (HCC) 01/29/2024   Lower back pain 01/26/2024   Closed compression fracture of L3 lumbar vertebra, initial encounter (HCC) 01/21/2024   Intractable low back pain 01/03/2024   Ambulatory dysfunction 12/11/2023  Uncontrolled pain 12/08/2023   Pressure injury of skin 12/08/2023   Intractable back pain 12/07/2023   Prediabetes 10/28/2023   Ankle fracture, bimalleolar, closed, left, sequela 03/10/2023   Closed fracture of left ankle 03/10/2023   Syndesmotic disruption of ankle, left, initial encounter 03/10/2023    Lumbar transverse process fracture (HCC) 01/02/2023   Acute on chronic hypoxic respiratory failure (HCC) 12/13/2022   Depression, major, single episode, moderate (HCC) 09/10/2022   Aortic atherosclerosis 09/10/2022   Chronic respiratory failure with hypoxia (HCC) 05/22/2021   Anxiety and depression 05/22/2021   Vitamin D  deficiency 05/09/2021   Left foot drop 01/09/2021   Pulmonary HTN (HCC) 10/12/2020   Encounter for long-term opiate analgesic use 02/02/2020   Interstitial lung disease (HCC) 04/14/2019   GERD (gastroesophageal reflux disease) 10/15/2017   Personal history of noncompliance with medical treatment, presenting hazards to health 04/26/2017   Hypothyroidism 01/15/2017   Adrenal insufficiency (Addison's disease) (HCC) 08/22/2016   Symptomatic bradycardia    Adrenal insufficiency 08/17/2016   Hypokalemia 08/17/2016   Chronic pain syndrome 04/20/2016   Irritable bowel syndrome with constipation 11/14/2015   Avascular necrosis of bone of hip, left (HCC) 04/05/2015   Post herpetic neuralgia 10/22/2014   Mixed hyperlipidemia 08/25/2014   Obstructive sleep apnea 02/18/2014   Fibromyalgia 02/11/2013   Bilateral lower extremity edema 01/22/2013   Hypotension 01/16/2013   Fibrocystic breast disease in female 04/09/2012   Sarcoidosis 06/21/2008   PCP:  Alphonsa Glendia LABOR, MD Pharmacy:   [facility] [address] [place] [place] [address] Phone: [phone] Fax: [phone]  [place] [facility] [address] [place] [address] Phone: [phone] Fax: [phone]  [facility] [address] [place] [address] Phone: [phone] Fax: [phone]     Social Drivers of Health (SDOH) Social History: SDOH Screenings   Food Insecurity: No Food Insecurity (08/16/2024)  Housing: Low Risk (08/16/2024)  Transportation Needs: No Transportation Needs (08/16/2024)   Utilities: Not At Risk (08/16/2024)  Alcohol Screen: Low Risk (09/27/2023)  Depression (PHQ2-9): Low Risk (08/06/2024)  Financial Resource Strain: Low Risk (09/27/2023)  Physical Activity: Inactive (09/27/2023)  Social Connections: Socially Integrated (01/21/2024)  Stress: No Stress Concern Present (09/27/2023)  Tobacco Use: Low Risk (08/16/2024)  Health Literacy: Medium Risk (12/20/2023)   [facility] from [facility]   SDOH Interventions:     Readmission Risk Interventions    08/17/2024    5:43 PM 05/12/2024   11:28 AM 04/27/2024    9:59 AM  Readmission Risk Prevention Plan  Transportation Screening Complete Complete Complete  Medication Review [employer]) Complete Complete Complete  PCP or Specialist appointment within 3-5 days of discharge Complete Complete   HRI or Home Care Consult  Complete Complete  SW Recovery Care/Counseling Consult Complete Complete Complete  Palliative Care Screening Not Applicable Not Applicable Not Applicable  Skilled Nursing Facility Not Applicable Not Applicable Not Applicable

## 2024-08-25 NOTE — Progress Notes (Signed)
 Physical Therapy Treatment Patient Details Name: Kathleen [name] MRN: [id number] DOB: [birth year] Today's Date: 08/25/2024   History of Present Illness 63 yo F adm 12/21 findings for L lung PNA 12/23 reports CP with L arm pain. Recent visit to ED 12/18 for CP, cough and difficulty breathing 12/23 MRI (+) acute T8 compression fx with involvement of pedicles bil, unhealed L1 L5 fx, remote T11 L3 and L4 compression fx, PMH: chronic hypoxic RF (5L O2), ILD, sarcoidosis, Addison's disease, morbid obesity, bilateral hip AVN, osteoporosis with multiple vertebral fractures on chronic narcotics, and admissions for intractable pain d/t compression fractures of L4 (4/12-4/16) and then L3 (5/9-5/14)    PT Comments  Clamshell brace arrived from [employer]. Pt preferring to don in sitting. Pt requiring modA + 2 to transition from sidelying up to sitting position. TotalA for donning brace (pt spouse present and reports [employer] tech instructed him on donning/doffing). Pt able to stand with minA and ambulate ~12 ft with a RW. On 5L O2; SpO2 96%. Pt reports brace feels very tight, but overall she seemed to have improved tolerance for mobility once donned. Recommendation of HHPT remains appropriate.     If plan is discharge home, recommend the following: A little help with walking and/or transfers;A little help with bathing/dressing/bathroom;Assistance with cooking/housework;Assist for transportation;Help with stairs or ramp for entrance   Can travel by private vehicle        Equipment Recommendations  None recommended by PT    Recommendations for Other Services       Precautions / Restrictions Precautions Precautions: Fall;Back Precaution/Restrictions Comments: on 5L at baseline Required Braces or Orthoses: Spinal Brace Spinal Brace: Applied in sitting position;Other (comment) (clamshell) Restrictions Weight Bearing Restrictions Per Provider Order: No     Mobility  Bed Mobility Overal bed mobility: Needs  Assistance Bed Mobility: Sidelying to Sit, Sit to Sidelying   Sidelying to sit: Mod assist, +2 for physical assistance     Sit to sidelying: Mod assist, +2 for safety/equipment General bed mobility comments: Pt received in R sidelying. Attempted to don clamshell brace in sidelying, however pt reporting too painful. Transitioned from sidelying to sitting with heavy assist at trunk to elevate. BLE elevation provided back into bed    Transfers Overall transfer level: Needs assistance Equipment used: Rolling walker (2 wheels) Transfers: Sit to/from Stand Sit to Stand: Min assist, +2 safety/equipment           General transfer comment: MinA to power up to stand position; pt preferring to pull up on RW, therapist stabilized    Ambulation/Gait Ambulation/Gait assistance: Contact guard assist, +2 safety/equipment Gait Distance (Feet): 12 Feet Assistive device: Rolling walker (2 wheels) Gait Pattern/deviations: Step-through pattern, Decreased stride length           Stairs             Wheelchair Mobility     Tilt Bed    Modified Rankin (Stroke Patients Only)       Balance Overall balance assessment: Needs assistance Sitting-balance support: Feet supported, Bilateral upper extremity supported Sitting balance-Leahy Scale: Poor Sitting balance - Comments: Heavy BUE support due to pain   Standing balance support: Bilateral upper extremity supported Standing balance-Leahy Scale: Poor Standing balance comment: Dependent on RW                            Communication Communication Communication: No apparent difficulties  Cognition Arousal: Alert Behavior During Therapy: Flat affect  PT - Cognitive impairments: No apparent impairments                         Following commands: Intact      Cueing Cueing Techniques: Verbal cues  Exercises      General Comments        Pertinent Vitals/Pain Pain Assessment Pain Assessment:  Faces Faces Pain Scale: Hurts whole lot Pain Location: mid R back Pain Descriptors / Indicators: Grimacing, Guarding, Aching, Moaning Pain Intervention(s): Limited activity within patient's tolerance, Premedicated before session, Monitored during session, Repositioned, Heat applied    Home Living                          Prior Function            PT Goals (current goals can now be found in the care plan section) Acute Rehab PT Goals Patient Stated Goal: Return Home Progress towards PT goals: Progressing toward goals    Frequency    Min 3X/week      PT Plan      Co-evaluation PT/OT/SLP Co-Evaluation/Treatment: Yes Reason for Co-Treatment: To address functional/ADL transfers;For patient/therapist safety          AM-PAC PT 6 Clicks Mobility   Outcome Measure  Help needed turning from your back to your side while in a flat bed without using bedrails?: A Little Help needed moving from lying on your back to sitting on the side of a flat bed without using bedrails?: A Lot Help needed moving to and from a bed to a chair (including a wheelchair)?: A Little Help needed standing up from a chair using your arms (e.g., wheelchair or bedside chair)?: A Little Help needed to walk in hospital room?: A Little Help needed climbing 3-5 steps with a railing? : Total 6 Click Score: 15    End of Session Equipment Utilized During Treatment: Gait belt;Back brace Activity Tolerance: Patient limited by pain Patient left: in bed;with call bell/phone within reach;with family/visitor present Nurse Communication: Mobility status PT Visit Diagnosis: Unsteadiness on feet (R26.81);Other abnormalities of gait and mobility (R26.89);Difficulty in walking, not elsewhere classified (R26.2);Pain     Time: 1443-1510 PT Time Calculation (min) (ACUTE ONLY): 27 min  Charges:    $Therapeutic Activity: 8-22 mins PT General Charges $$ ACUTE PT VISIT: 1 Visit                      Aleck Daring, PT, DPT Acute Rehabilitation Services Office [phone]    Aleck ONEIDA Daring 08/25/2024, 4:19 PM

## 2024-08-25 NOTE — Plan of Care (Signed)
" °  Problem: Health Behavior/Discharge Planning: Goal: Ability to manage health-related needs will improve Outcome: Progressing   Problem: Activity: Goal: Risk for activity intolerance will decrease Outcome: Progressing   Problem: Coping: Goal: Level of anxiety will decrease Outcome: Progressing  Problem: Respiratory: Goal: Ability to maintain adequate ventilation will improve Outcome: Progressing   "

## 2024-08-25 NOTE — Progress Notes (Addendum)
 " PROGRESS NOTE    Kathleen [name]  FMW:[id number] DOB: [birth year] DOA: 08/16/2024 PCP: Alphonsa Glendia LABOR, MD   Brief Narrative:  Kathleen [name] is a 63 y.o. female with medical history significant for chronic respiratory failure on 5 L, Addison's disease, CHF, hypothyroidism, ILD and OSA. Patient presented to the ED with complaints of congestion, chest pain, cough and difficulty breathing over the past several days.  She was in the ED with similar complaints 12/18, CTA chest was negative for PE, and troponins EKG were unremarkable. Patient also having severe pain in upper back, left rib cage under her breast. Husband reported left mid back/low back pain ongoing for past couple of weeks.  She says she has been basically bedridden due to severity of the pain.  Was initially admitted at [facility] hospital for treatment of pneumonia however patient and family was adamant about transfer to [facility] given back pain issues.  Completed IV antibiotics for pneumonia.  MRI shows new T8 fracture which could be due to culprit for upper back/rib cage pain.  Has multilevel lumbar compression fractures which are chronic.  Current active issue is pain management which has been difficult, exacerbated by anxiety.  TLSO brace to be fitted 12/30   Assessment & Plan:   Principal Problem:   PNA (pneumonia) Active Problems:   Interstitial lung disease (HCC)   Chronic respiratory failure with hypoxia (HCC)   Anxiety and depression   Sarcoidosis   Fibromyalgia   Obstructive sleep apnea   Chronic pain syndrome   Hypothyroidism   Adrenal insufficiency (Addison's disease) (HCC)   Depression, major, single episode, moderate (HCC)  1-left lung pneumonia - Completed antibiotic therapy. Patient is on chronic home oxygen  5 L/min  2-acute on chronic low back/mid back pain.  -Patient with multiple lumbar compression fractures in the past and on chronic opiate therapy. -Acute upper back pain, found to have T8 compression  fracture which is new. -Currently on Robaxin  scheduled, lidocaine  patch, on IV as well as p.o. Dilaudid  as needed. - Patient continues to complain of significant pain.  Continue Dilaudid  IV and p.o. as needed.  Robaxin , lidocaine . - Evaluated by neurosurgery.  No surgery recommended.  Patient need to follow-up with her primary outpatient spine surgery - Awaiting TLSO brace from [employer].  Plan to fit it today -PT/OT - Discussed in length with patient and spouse at the bedside about weaning off IV Dilaudid .  Patient is agreeable.  2-Addison's disease - Received stress dose steroids.  Back to home dose hydrocortisone  20 mg 8 AM and 10 mg noon - Continue midodrine  and nasal calcitonin - Continue following electrolytes stability and maintain adequate hydration.   3-chronic diastolic heart failure - Stable and compensated - Continue home dose torsemide .   4-prolonged QT - Magnesium  within normal limits - Follow potassium and replete as needed - Continue minimizing agents that can further prolong QT.   5-chronic respiratory failure, interstitial lung disease and history of sarcoidosis/obstructive sleep apnea - Patient is chronically on 5 L supplementation - Continue to maintain oxygen  saturation above 90% - Continue bronchodilator management.     7-depression/anxiety - Continue BuSpar  - No suicidal ideation or hallucination.   8-hypothyroidism - Continue Synthroid .   DVT prophylaxis: Eliquis  Code Status: Full code. Family Communication: none Disposition: Home with home health.  Patient declines any prospect of going to rehab or SNF Status is: Inpatient Remains inpatient appropriate because: Continue IV therapy.  Pain control   Subjective:  Patient sat on the commode earlier  and then on the edge of the bed.  Continues to complain of back pain.  She clearly is anxious and tearful during conversation. Sposue at the bedside.   Objective: Vitals:   08/24/24 2159 08/25/24 0630  08/25/24 0938 08/25/24 1208  BP: (!) 124/53 (!) 100/47 (!) 118/40 126/61  Pulse:  75 72   Resp:  18 17   Temp:  98.4 F (36.9 C) 98.5 F (36.9 C)   TempSrc:  Oral    SpO2:  100% 100%   Weight:      Height:        Intake/Output Summary (Last 24 hours) at 08/25/2024 1412 Last data filed at 08/25/2024 1150 Gross per 24 hour  Intake 596 ml  Output --  Net 596 ml    Filed Weights   08/16/24 1619  Weight: 108.9 kg    Examination:  General: Sitting on the edge of the bed, tearful during conversation  HEENT: Moist oral mucosa Chest: Clear to auscultation bilaterally, diminished bilaterally, no wheezes CVs: S1, S2, no murmur, regular rhythm Abdomen: Soft, nontender to palpation  Data Reviewed: I have personally reviewed following labs and imaging studies  CBC: No results for input(s): WBC, NEUTROABS, HGB, HCT, MCV, PLT in the last 168 hours.  Basic Metabolic Panel: No results for input(s): NA, K, CL, CO2, GLUCOSE, BUN, CREATININE, CALCIUM , MG, PHOS in the last 168 hours.  GFR: Estimated Creatinine Clearance: 98.7 mL/min (by C-G formula based on SCr of 0.65 mg/dL). Liver Function the left f: No results for input(s): AST, ALT, ALKPHOS, BILITOT, PROT, ALBUMIN in the last 168 hours.  No results for input(s): LIPASE, AMYLASE in the last 168 hours. No results for input(s): AMMONIA  in the last 168 hours. Coagulation Profile: No results for input(s): INR, PROTIME in the last 168 hours. Cardiac Enzymes: No results for input(s): CKTOTAL, CKMB, CKMBINDEX, TROPONINI in the last 168 hours. BNP (last 3 results) Recent Labs    05/30/24 2251 08/16/24 1642  PROBNP 192.0 87.8   HbA1C: No results for input(s): HGBA1C in the last 72 hours. CBG: No results for input(s): GLUCAP in the last 168 hours.  Lipid Profile: No results for input(s): CHOL, HDL, LDLCALC, TRIG, CHOLHDL, LDLDIRECT in the last 72  hours. Thyroid  Function Tests: No results for input(s): TSH, T4TOTAL, FREET4, T3FREE, THYROIDAB in the last 72 hours. Anemia Panel: No results for input(s): VITAMINB12, FOLATE, FERRITIN, TIBC, IRON , RETICCTPCT in the last 72 hours. Sepsis Labs: No results for input(s): PROCALCITON, LATICACIDVEN in the last 168 hours.   Recent Results (from the past 240 hours)  Resp panel by RT-PCR (RSV, Flu A&B, Covid) Anterior Nasal Swab     Status: None   Collection Time: 08/16/24  4:27 PM   Specimen: Anterior Nasal Swab  Result Value Ref Range Status   SARS Coronavirus 2 by RT PCR NEGATIVE NEGATIVE Final    Comment: (NOTE) SARS-CoV-2 target nucleic acids are NOT DETECTED.  The SARS-CoV-2 RNA is generally detectable in upper respiratory specimens during the acute phase of infection. The lowest concentration of SARS-CoV-2 viral copies this assay can detect is 138 copies/mL. A negative result does not preclude SARS-Cov-2 infection and should not be used as the sole basis for treatment or other patient management decisions. A negative result may occur with  improper specimen collection/handling, submission of specimen other than nasopharyngeal swab, presence of viral mutation(s) within the areas targeted by this assay, and inadequate number of viral copies(<138 copies/mL). A negative result must be combined with clinical  observations, patient history, and epidemiological information. The expected result is Negative.  Fact Sheet for Patients:  bloggercourse.com  Fact Sheet for Healthcare Providers:  seriousbroker.it  This test is no t yet approved or cleared by the [place]  FDA and  has been authorized for detection and/or diagnosis of SARS-CoV-2 by FDA under an Emergency Use Authorization (EUA). This EUA will remain  in effect (meaning this test can be used) for the duration of the COVID-19 declaration under  Section 564(b)(1) of the Act, 21 U.S.C.section 360bbb-3(b)(1), unless the authorization is terminated  or revoked sooner.       Influenza A by PCR NEGATIVE NEGATIVE Final   Influenza B by PCR NEGATIVE NEGATIVE Final    Comment: (NOTE) The Xpert Xpress SARS-CoV-2/FLU/RSV plus assay is intended as an aid in the diagnosis of influenza from Nasopharyngeal swab specimens and should not be used as a sole basis for treatment. Nasal washings and aspirates are unacceptable for Xpert Xpress SARS-CoV-2/FLU/RSV testing.  Fact Sheet for Patients: bloggercourse.com  Fact Sheet for Healthcare Providers: seriousbroker.it  This test is not yet approved or cleared by the [place]  FDA and has been authorized for detection and/or diagnosis of SARS-CoV-2 by FDA under an Emergency Use Authorization (EUA). This EUA will remain in effect (meaning this test can be used) for the duration of the COVID-19 declaration under Section 564(b)(1) of the Act, 21 U.S.C. section 360bbb-3(b)(1), unless the authorization is terminated or revoked.     Resp Syncytial Virus by PCR NEGATIVE NEGATIVE Final    Comment: (NOTE) Fact Sheet for Patients: bloggercourse.com  Fact Sheet for Healthcare Providers: seriousbroker.it  This test is not yet approved or cleared by the [place]  FDA and has been authorized for detection and/or diagnosis of SARS-CoV-2 by FDA under an Emergency Use Authorization (EUA). This EUA will remain in effect (meaning this test can be used) for the duration of the COVID-19 declaration under Section 564(b)(1) of the Act, 21 U.S.C. section 360bbb-3(b)(1), unless the authorization is terminated or revoked.  Performed at [facility], [address], [place], [place] [address]   Blood culture (routine x 2)     Status: None   Collection Time: 08/16/24  5:53 PM   Specimen: BLOOD RIGHT HAND   Result Value Ref Range Status   Specimen Description BLOOD RIGHT HAND  Final   Special Requests AEROBIC BOTTLE ONLY Blood Culture adequate volume  Final   Culture   Final    NO GROWTH 5 DAYS Performed at [facility], [address], [place], [place] [address]    Report Status 08/21/2024 FINAL  Final  Blood culture (routine x 2)     Status: None   Collection Time: 08/16/24  6:00 PM   Specimen: Left Antecubital; Blood  Result Value Ref Range Status   Specimen Description LEFT ANTECUBITAL  Final   Special Requests   Final    BOTTLES DRAWN AEROBIC AND ANAEROBIC Blood Culture adequate volume   Culture   Final    NO GROWTH 5 DAYS Performed at [facility], [address] [place], [place] [address]    Report Status 08/21/2024 FINAL  Final         Radiology Studies: No results found.       Scheduled Meds:  apixaban   5 mg Oral BID   busPIRone   15 mg Oral TID   calcitonin (salmon)  1 spray Alternating Nares Daily   ferrous sulfate   325 mg Oral Weekly   folic acid   1  mg Oral Daily   gabapentin   300 mg Oral QID   hydrocortisone   10 mg Oral Q1200   hydrocortisone   10 mg Oral Once   hydrocortisone   20 mg Oral Daily   levothyroxine   50 mcg Oral QAC breakfast   lidocaine   1 patch Transdermal Q24H   linaclotide   145 mcg Oral QAC breakfast   methocarbamol   750 mg Oral Q8H   midodrine   10 mg Oral TID WC   pantoprazole   40 mg Oral BID   potassium chloride   10 mEq Oral BID   revefenacin   175 mcg Nebulization Daily   rosuvastatin   20 mg Oral Daily   senna-docusate  1 tablet Oral BID   topiramate   50 mg Oral Q lunch   torsemide   40 mg Oral Daily   Vitamin D  (Ergocalciferol )  50,000 Units Oral Q Sun   Continuous Infusions:          Erica Osuna, MD [facility] 08/25/2024, 2:12 PM   "

## 2024-08-26 ENCOUNTER — Other Ambulatory Visit (HOSPITAL_COMMUNITY): Payer: Self-pay

## 2024-08-26 DIAGNOSIS — D860 Sarcoidosis of lung: Secondary | ICD-10-CM

## 2024-08-26 DIAGNOSIS — R079 Chest pain, unspecified: Secondary | ICD-10-CM

## 2024-08-26 MED ORDER — HYDROMORPHONE HCL 2 MG PO TABS
2.0000 mg | ORAL_TABLET | ORAL | 0 refills | Status: DC | PRN
  Filled 2024-08-26: qty 30, 5d supply, fill #0

## 2024-08-26 MED ORDER — METHOCARBAMOL 750 MG PO TABS
750.0000 mg | ORAL_TABLET | Freq: Three times a day (TID) | ORAL | 2 refills | Status: AC
  Filled 2024-08-26 – 2024-09-18 (×3): qty 90, 30d supply, fill #0

## 2024-08-26 MED ORDER — HYDROMORPHONE HCL 2 MG PO TABS: 1.0000 mg | ORAL_TABLET | ORAL | Status: DC | PRN

## 2024-08-26 MED ORDER — LORAZEPAM 0.5 MG PO TABS
0.5000 mg | ORAL_TABLET | Freq: Two times a day (BID) | ORAL | 0 refills | Status: DC | PRN
  Filled 2024-08-26: qty 30, 15d supply, fill #0

## 2024-08-26 MED ORDER — HYDROMORPHONE HCL 2 MG PO TABS
1.0000 mg | ORAL_TABLET | ORAL | 0 refills | Status: DC | PRN
  Filled 2024-08-26: qty 20, 7d supply, fill #0

## 2024-08-26 MED ORDER — ACETAMINOPHEN 500 MG PO TABS
1000.0000 mg | ORAL_TABLET | Freq: Three times a day (TID) | ORAL | Status: DC
  Administered 2024-08-26: 1000 mg via ORAL
  Filled 2024-08-26: qty 2

## 2024-08-26 MED ORDER — DULOXETINE HCL 30 MG PO CPEP
30.0000 mg | ORAL_CAPSULE | Freq: Every day | ORAL | Status: DC
  Administered 2024-08-26: 30 mg via ORAL
  Filled 2024-08-26: qty 1

## 2024-08-26 MED ORDER — MIDODRINE HCL 10 MG PO TABS
10.0000 mg | ORAL_TABLET | Freq: Three times a day (TID) | ORAL | 3 refills | Status: AC
  Filled 2024-08-26: qty 90, 30d supply, fill #0

## 2024-08-26 MED ORDER — LORAZEPAM 0.5 MG PO TABS: 0.5000 mg | ORAL_TABLET | Freq: Two times a day (BID) | ORAL | Status: DC | PRN

## 2024-08-26 MED ORDER — DULOXETINE HCL 30 MG PO CPEP
30.0000 mg | ORAL_CAPSULE | Freq: Every day | ORAL | 1 refills | Status: AC
  Filled 2024-08-26: qty 30, 30d supply, fill #0

## 2024-08-26 NOTE — Progress Notes (Addendum)
 Patient has not had any IV pain med on this shift, only given one PO pain med this far in shift. She is very drowsy and hard to wake to voice; patient has to be shook physically to get her awake.  Edit: pt states she is not usually like that because she has been staying up and not sleeping during the night.

## 2024-08-26 NOTE — Progress Notes (Addendum)
 Discharge   Patient spouse expressed verbal understanding of discharge POC.   Patient spouse  given time to ask any questions.  Additional education included in AVS.  Alert oriented in good spirits.   Tele and PIV removed. Pressure dressings intact. CCMD/ REGGIE  All personal belongings at bedtime. Pt has own portable Oxygen  tank at bedtime for transport home.  Patient on 5L [place] no distress noted.  TOC meds at bedside.   Back brace to be placed by staff or PT prior to discharge.

## 2024-08-26 NOTE — Discharge Summary (Signed)
 " Physician Discharge Summary   Patient: Kathleen [name] MRN: [id number] DOB: 1961/05/11  Admit date:     08/16/2024  Discharge date: 08/26/2024  Discharge Physician: Nydia Distance, MD    PCP: Alphonsa Glendia LABOR, MD   Recommendations at discharge:   Patient requested oral Dilaudid  for the pain control, prescription was picked up from [facility] [place] pharmacy on 08/24/2024.   Patient was recommended to hold oxycodone  while taking Dilaudid .  She will need to call her primary physician to either have refill on Dilaudid  or to resume oxycodone .  Continue TLSO brace  Discharge Diagnoses:    PNA (pneumonia) Acute on chronic low back/mid back pain, T8 compression fracture T8 compression fracture   Interstitial lung disease (HCC)   Chronic respiratory failure with hypoxia (HCC)   Anxiety and depression   Sarcoidosis   Fibromyalgia   Obstructive sleep apnea   Chronic pain syndrome   Hypothyroidism   Adrenal insufficiency (Addison's disease) (HCC)   Depression, major, single episode, moderate [facility])   Hospital Course:  63 y.o. female with medical history significant for chronic respiratory failure on 5 L, Addison's disease, CHF, hypothyroidism, ILD and OSA. Patient presented to the ED with complaints of congestion, chest pain, cough and difficulty breathing over the past several days.  She was in the ED with similar complaints 12/18, CTA chest was negative for PE, and troponins EKG were unremarkable. Patient also having severe pain in upper back, left rib cage under her breast. Husband reported left mid back/low back pain ongoing for past couple of weeks.  She says she has been basically bedridden due to severity of the pain.   Was initially admitted at [facility] hospital for treatment of pneumonia however patient and family was adamant about transfer to [facility] given back pain issues.   Completed IV antibiotics for pneumonia.  MRI shows new T8 fracture which could be due to culprit for upper back/rib  cage pain.  Has multilevel lumbar compression fractures which are chronic.   TLSO brace fitted on 12/30   Assessment and Plan:   Left lung pneumonia - Completed antibiotic therapy. Patient is on chronic home oxygen  5 L/min   Acute on chronic low back/mid back pain. -Patient with multiple lumbar compression fractures in the past and on chronic opiate therapy. -Acute upper back pain, found to have T8 compression fracture which is new. -Currently on Robaxin  scheduled, lidocaine  patch, p.o. Dilaudid  as needed. - Patient has been taking oral oxycodone  as needed PTA for back pain, recommended to hold oxycodone  while she is taking p.o. Dilaudid .  Patient reported that her acute pain is better controlled with oral Dilaudid  than oxycodone .   - Evaluated by neurosurgery.  No surgery recommended.  Patient need to follow-up with her primary outpatient spine surgery -TLSO brace was fitted on 7/30. -She was adamant about not going to rehab place.  Prefers home with home health PT.   Addison's disease - Received stress dose steroids.  Back to home dose hydrocortisone  20 mg 8 AM and 10 mg noon - Continue midodrine  and nasal calcitonin - Continue following electrolytes stability and maintain adequate hydration.   chronic diastolic heart failure - Stable and compensated - Continue home dose torsemide .   prolonged QT - QT 446 on EKG on 12/23   chronic respiratory failure, interstitial lung disease and history of sarcoidosis/obstructive sleep apnea - Patient is chronically on 5 L supplementation - Continue to maintain oxygen  saturation above 90% - Continue bronchodilator management.  depression/anxiety - Continue BuSpar  - No suicidal ideation or hallucination.   hypothyroidism - Continue Synthroid .     Pain control - [place]  Controlled Substance Reporting System database was reviewed. and patient was instructed, not to drive, operate heavy machinery, perform activities at  heights, swimming or participation in water  activities or provide baby-sitting services while on Pain, Sleep and Anxiety Medications; until their outpatient Physician has advised to do so again. Also recommended to not to take more than prescribed Pain, Sleep and Anxiety Medications.  Consultants: Neurosurgery Procedures performed:   Disposition: Home Diet recommendation:  Discharge Diet Orders (From admission, onward)     Start     Ordered   08/26/24 0000  Diet - low sodium heart healthy        08/26/24 0954           Regular diet DISCHARGE MEDICATION: Allergies as of 08/26/2024       Reactions   Bee Venom Anaphylaxis   Contrast Media [iodinated Contrast Media] Other (See Comments)   CT chest w/ contrast at OSH followed by SOB resolved w/ single dose steroids, never ENT swelling, intubation or pressors, has had multiple contrasted scans since   Bactrim  [sulfamethoxazole -trimethoprim ] Nausea And Vomiting   Codeine Itching        Medication List     PAUSE taking these medications    oxyCODONE  15 MG immediate release tablet Wait to take this until your doctor or other care provider tells you to start again. Commonly known as: ROXICODONE  Take 1 tablet (15 mg total) by mouth every 4 (four) hours as needed for pain. What changed: when to take this       STOP taking these medications    hydrOXYzine  10 MG tablet Commonly known as: ATARAX        TAKE these medications    acetaminophen  325 MG tablet Commonly known as: TYLENOL  Take 2 tablets (650 mg total) by mouth every 6 (six) hours as needed for mild pain (pain score 1-3) or fever (or Fever >/= 101).   alendronate  70 MG tablet Commonly known as: FOSAMAX  Take 1 tablet (70 mg total) by mouth every Saturday. Take with a full glass of water  on an empty stomach.   budesonide  0.5 MG/2ML nebulizer solution Commonly known as: PULMICORT  Take 0.5 mg by nebulization 2 (two) times daily.   buprenorphine  15  MCG/HR Commonly known as: BUTRANS  Place onto the skin every 7 (seven) days for 28 days. **Remove old patch, fold and discard away from children and pets.**   busPIRone  15 MG tablet Commonly known as: BUSPAR  Take 1 tablet (15 mg) by mouth 3 times daily.   calcitonin (salmon) 200 UNIT/ACT nasal spray Commonly known as: MIACALCIN /FORTICAL Place 1 spray into alternate nostrils daily.   Calcium  Citrate 333 MG Tabs Take one tablet by mouth 3 (three) times daily with meals.   capsaicin -methyl sal-menthol  0.025-1-12 % topical cream Generic drug: Capsaicin -Menthol -Methyl Sal Apply 1 Application topically 3 (three) times daily as needed.   DULoxetine  30 MG capsule Commonly known as: CYMBALTA  Take 1 capsule (30 mg total) by mouth daily.   Eliquis  5 MG Tabs tablet Generic drug: apixaban  Take 1 tablet (5 mg total) by mouth 2 (two) times daily.   FeroSul 325 (65 Fe) MG tablet Generic drug: ferrous sulfate  Take 1 tablet (325 mg total) by mouth once a week.   folic acid  1 MG tablet Commonly known as: FOLVITE  Take 1 tablet (1 mg total) by mouth daily.   gabapentin  300  MG capsule Commonly known as: NEURONTIN  Take 1 capsule (300 mg total) by mouth 4 (four) times daily.   hydrocortisone  10 MG tablet Commonly known as: CORTEF  Take 2 tablets by mouth at 8:00 am and 1 tablet at lunch. (May double dose on sick days x 3-5 days)   HYDROmorphone  2 MG tablet Commonly known as: Dilaudid  Take 0.5 tablets (1 mg total) by mouth every 4 (four) hours as needed for severe pain (pain score 7-10).   levothyroxine  50 MCG tablet Commonly known as: SYNTHROID  Take 1 tablet by mouth daily before breakfast.   lidocaine  5 % Commonly known as: Lidoderm  Place 1 patch onto the skin daily. Remove & Discard patch within 12 hours or as directed by MD   Linzess  145 MCG Caps capsule Generic drug: linaclotide  Take 1 capsule (145 mcg total) by mouth daily before breakfast.   LORazepam  0.5 MG tablet Commonly  known as: ATIVAN  Take 1 tablet (0.5 mg total) by mouth 2 (two) times daily as needed for anxiety.   methocarbamol  750 MG tablet Commonly known as: ROBAXIN  Take 1 tablet (750 mg total) by mouth 3 (three) times daily.   midodrine  10 MG tablet Commonly known as: PROAMATINE  Take 1 tablet (10 mg total) by mouth 3 (three) times daily with meals. What changed:  medication strength how much to take when to take this   ondansetron  8 MG disintegrating tablet Commonly known as: ZOFRAN -ODT Dissolve 1 tablet (8 mg total) by mouth every 8 (eight) hours as needed.   OXYGEN  Inhale 5 L into the lungs continuous.   Ozempic  (0.25 or 0.5 MG/DOSE) 2 MG/3ML Sopn Generic drug: Semaglutide (0.25 or 0.5MG /DOS) Inject 0.25 mg into the skin once a week.   pantoprazole  40 MG tablet Commonly known as: PROTONIX  Take 1 tablet (40 mg total) by mouth 2 (two) times daily.   polyethylene glycol 17 g packet Commonly known as: MIRALAX  / GLYCOLAX  Take 17 g by mouth 2 (two) times daily.   potassium chloride  10 MEQ tablet Commonly known as: KLOR-CON  M Take 1 tablet (10 mEq total) by mouth 2 (two) times daily.   rosuvastatin  20 MG tablet Commonly known as: Crestor  Take 1 tablet (20 mg total) by mouth daily.   senna-docusate 8.6-50 MG tablet Commonly known as: Senokot-S Take 2 tablets by mouth at bedtime.   topiramate  50 MG tablet Commonly known as: Topamax  Take 1 tablet (50 mg total) by mouth daily with lunch.   torsemide  20 MG tablet Commonly known as: DEMADEX  Take 2 tablets by mouth every morning, may take 1 additional torsemide  at 1 PM when weight gain is more than 2 pounds in 1 day   Vitamin D  (Ergocalciferol ) 1.25 MG (50000 UNIT) Caps capsule Commonly known as: DRISDOL  Take 1 capsule (50,000 Units total) by mouth every Sunday.   Yupelri  175 MCG/3ML nebulizer solution Generic drug: revefenacin  Take 3 mLs (175 mcg total) by nebulization daily.       ASK your doctor about these medications     methocarbamol  500 MG tablet Commonly known as: ROBAXIN  Take 2 tablets (1,000 mg total) by mouth every 6 (six) hours as needed for up to 7 days for muscle spasms. Ask about: Should I take this medication?        Contact information for follow-up providers     Luking, Glendia LABOR, MD. Schedule an appointment as soon as possible for a visit in 2 week(s).   Specialty: Family Medicine Why: for hospital follow-up Contact information: [address] [place]  [place] [address] [phone]              Contact information for after-discharge care     Home Medical Care     [facility]) Follow up.   Service: Home Health Services Why: your home health agency Contact information: [address] [place]  [address] [phone]                    Discharge Exam: Fredricka Weights   08/16/24 1619  Weight: 108.9 kg   S: Wants to go home, complaining of back pain.  TLSO brace was fitted yesterday.  Wants to go home with home health PT.  BP (!) 129/48   Pulse 82   Temp 97.6 F (36.4 C)   Resp 18   Ht 5' 10 (1.778 m)   Wt 108.9 kg   LMP 05/17/2016   SpO2 100%   BMI 34.44 kg/m   Physical Exam General: Alert and oriented x 3, NAD Cardiovascular: S1 S2 clear, RRR.  Respiratory: CTAB, no wheezing, rales or rhonchi Gastrointestinal: Soft, nontender, nondistended, NBS Ext: no pedal edema bilaterally Neuro: no new deficits Psych: Normal affect    Condition at discharge: fair  The results of significant diagnostics from this hospitalization (including imaging, microbiology, ancillary and laboratory) are listed below for reference.   Imaging Studies: DG CHEST PORT 1 VIEW Result Date: 08/20/2024 EXAM: 1 VIEW(S) XRAY OF THE CHEST 08/20/2024 05:42:00 AM COMPARISON: AP chest 08/16/2024. CLINICAL HISTORY: Chest pain. FINDINGS: LUNGS AND PLEURA: Continued patchy opacities of the left lung base and a small left pleural  effusion compatible with pneumonia and parapneumonic effusion. This is only slightly improved. The lungs are emphysematous with coarse perihilar scarring changes in the upper lobes with no other active process. No pneumothorax. HEART AND MEDIASTINUM: The mediastinum is stable with superior widening, aortic atherosclerosis. There is cardiomegaly without evidence for CHF. BONES AND SOFT TISSUES: Osteopenia and degenerative change of the spine. IMPRESSION: 1. Continued patchy opacities of the left lung base and a small left pleural effusion compatible with pneumonia and parapneumonic effusion, slightly improved. 2. Cardiomegaly without evidence of congestive heart failure. Electronically signed by: Francis Quam MD 08/20/2024 07:36 AM EST RP Workstation: [id number]   MR THORACIC SPINE W WO CONTRAST Result Date: 08/19/2024 EXAM: MRI Thoracic and Lumbar Spine With and Without Intravenous Contrast 08/18/2024 08:52:00 PM TECHNIQUE: Multiplanar multisequence MRI of the thoracic and lumbar spine was performed with and without the administration of intravenous contrast. COMPARISON: 07/30/24 CT thoracic and lumbar spine. CLINICAL HISTORY: Mid-back pain, prior compression fracture FINDINGS: BONES AND ALIGNMENT: Normal alignment. Acute/recent or unhealed T8 compression fracture with 30% height loss, marrow edema, and intense enhancement. Fracture involves the pedicles bilaterally. Acute/recent or unhealed L1 and L5 superior endplate fractures with approximately 40% height loss. Associated marrow edema and enhancement. Remote T11, L3 and L4 compression fractures with approximately 20-30% height loss and no marrow edema. SPINAL CORD: Normal spinal cord size. Normal spinal cord signal. SOFT TISSUES: Unremarkable. THORACIC AND LUMBAR DISC LEVELS: No significant disc herniation. No spinal canal stenosis or neural foraminal narrowing. IMPRESSION: 1. In comparison to 12/4, new/interval acute T8 compression fracture with involvement  of the pedicles bilaterally and 30% height loss. 2. Unhealed L1 and L5 superior endplate fractures with similar height loss and marrow edema. 3. Remote T11, L3 and L4 compression fractures. 4. No significant canal or foraminal stenosis in the thoracic or lumbar spine. Electronically signed by: Gilmore Molt  08/19/2024 03:35 AM EST RP Workstation: [id number]   MR Lumbar Spine W Wo Contrast Result Date: 08/19/2024 EXAM: MRI Thoracic and Lumbar Spine With and Without Intravenous Contrast 08/18/2024 08:52:00 PM TECHNIQUE: Multiplanar multisequence MRI of the thoracic and lumbar spine was performed with and without the administration of intravenous contrast. COMPARISON: 07/30/24 CT thoracic and lumbar spine. CLINICAL HISTORY: Mid-back pain, prior compression fracture FINDINGS: BONES AND ALIGNMENT: Normal alignment. Acute/recent or unhealed T8 compression fracture with 30% height loss, marrow edema, and intense enhancement. Fracture involves the pedicles bilaterally. Acute/recent or unhealed L1 and L5 superior endplate fractures with approximately 40% height loss. Associated marrow edema and enhancement. Remote T11, L3 and L4 compression fractures with approximately 20-30% height loss and no marrow edema. SPINAL CORD: Normal spinal cord size. Normal spinal cord signal. SOFT TISSUES: Unremarkable. THORACIC AND LUMBAR DISC LEVELS: No significant disc herniation. No spinal canal stenosis or neural foraminal narrowing. IMPRESSION: 1. In comparison to 12/4, new/interval acute T8 compression fracture with involvement of the pedicles bilaterally and 30% height loss. 2. Unhealed L1 and L5 superior endplate fractures with similar height loss and marrow edema. 3. Remote T11, L3 and L4 compression fractures. 4. No significant canal or foraminal stenosis in the thoracic or lumbar spine. Electronically signed by: Gilmore Molt 08/19/2024 03:35 AM EST RP Workstation: [id number]   DG Chest 2 View Result Date:  08/16/2024 CLINICAL DATA:  Cough and shortness of breath. EXAM: CHEST - 2 VIEW COMPARISON:  Radiograph and CT 3 days ago 08/13/2024 FINDINGS: Stable heart size and mediastinal contours. Prominent epicardial fat pad is well as epicardial fat on recent CT. Increased peribronchial thickening and interstitial opacities in the left lung. Mild scarring in the right upper lobe. No pneumothorax or large pleural effusion. Limited assessment of the osseous structures. IMPRESSION: Increased peribronchial thickening and interstitial opacities in the left lung, suspicious for bronchopneumonia. Electronically Signed   By: Andrea Gasman M.D.   On: 08/16/2024 17:07   CT Angio Chest/Abd/Pel for Dissection W and/or Wo Contrast Result Date: 08/13/2024 EXAM: CTA CHEST AORTA 08/13/2024 04:54:55 AM TECHNIQUE: CTA of the chest was performed without and with the administration of 100 mL of intravenous iohexol  (OMNIPAQUE ) 350 MG/ML injection. Multiplanar reformatted images are provided for review. MIP images are provided for review. Automated exposure control, iterative reconstruction, and/or weight based adjustment of the mA/kV was utilized to reduce the radiation dose to as low as reasonably achievable. COMPARISON: CT of the abdomen and pelvis dated 04/26/2024 and CT of the chest dated 01/21/2024. CLINICAL HISTORY: ro dissection ro dissection FINDINGS: AORTA: There is mild calcific atheromatous disease within the aorta, but no evidence of aneurysm or dissection. MEDIASTINUM: The heart and pericardium demonstrate no acute abnormality. LYMPH NODES: There is an enlarged right paratracheal lymph node again demonstrated, measuring approximately 2.2 x 1.6 cm, which has marginally enlarged in the interim. There are several other shotty mediastinal lymph nodes. No hilar or axillary lymphadenopathy. LUNGS AND PLEURA: There is what appears to be consolidation/scarring again demonstrated posterior medially within the upper lobes bilaterally,  associated with traction bronchiectasis and irregular reticulation, similar to the prior study. There is also mild ground glass and mosaic attenuation of the lower lobes bilaterally. No pleural effusion or pneumothorax. UPPER ABDOMEN: There is fatty infiltration of the liver. The patient is status post cholecystectomy. There is a simple cyst within the right kidney. There is fatty atrophy of the pancreas. SOFT TISSUES AND BONES: No acute bone or soft tissue abnormality. IMPRESSION: 1. No evidence of  aortic dissection or aneurysm. 2. Enlarged right paratracheal lymph node, marginally enlarged in the interim, measuring approximately 2.2 x 1.6 cm. Several other shotty mediastinal lymph nodes. 3. Posterior medial upper lobe consolidation/scarring with traction bronchiectasis and irregular reticulation, similar to prior study. 4. Mild ground glass and mosaic attenuation of the lower lobes bilaterally. Electronically signed by: Evalene Coho MD 08/13/2024 06:03 AM EST RP Workstation: [id number]   DG Chest 1 View Result Date: 08/13/2024 EXAM: 1 VIEW(S) XRAY OF THE CHEST 08/13/2024 02:13:00 AM COMPARISON: 07/30/2024 CLINICAL HISTORY: chest pain FINDINGS: LUNGS AND PLEURA: Stable chronic coarsened interstitial markings without pulmonary edema. Bibasilar pulmonary opacity related to exuberant mediastinal fat, but are seen on the CT examination of Jan 21, 2024. Biapical predominant parenchymal scarring may reflect changes of underlying sarcoidosis or pneumoconiosis. No pleural effusion. No pneumothorax. HEART AND MEDIASTINUM: No acute abnormality of the cardiac and mediastinal silhouettes. Exuberant mediastinal fat is noted, contributing to bibasilar pulmonary opacity as described in the LUNGS AND PLEURA section. This finding was also seen on the CT examination of Jan 21, 2024. BONES AND SOFT TISSUES: No acute osseous abnormality. IMPRESSION: 1. Stable chronic coarsened interstitial markings without pulmonary edema. 2.  Biapical predominant parenchymal scarring, which may reflect underlying sarcoidosis or pneumoconiosis. Electronically signed by: Dorethia Molt MD 08/13/2024 02:22 AM EST RP Workstation: [id number]   CT Cervical Spine Wo Contrast Result Date: 07/30/2024 EXAM: CT CERVICAL, THORACIC, AND LUMBAR SPINE WITHOUT INTRAVENOUS CONTRAST 07/30/2024 06:18:28 PM TECHNIQUE: CT of the cervical, thoracic and lumbar spine was performed without the administration of intravenous contrast. Multiplanar reformatted images are provided for review. Automated exposure control, iterative reconstruction, and/or weight based adjustment of the mA/kV was utilized to reduce the radiation dose to as low as reasonably achievable. COMPARISON: Thoracic spine MRI 03/11/2023 and prior imaging dated 04/26/2024. CLINICAL HISTORY: Bone lesion, cervical spine, incidental. FINDINGS: CERVICAL SPINE: BONES AND ALIGNMENT: There is no acute fracture or traumatic malalignment. DEGENERATIVE CHANGES: No significant degenerative changes. SOFT TISSUES: There is no prevertebral soft tissue swelling. THORACIC SPINE: BONE AND ALIGNMENT: There is a wedge compression fracture at T11 with 25% anterior height loss. This is chronic and unchanged compared to thoracic spine MRI 03/11/2023. The vertebral body heights are otherwise maintained. No osseous destructive lesion is seen. DEGENERATIVE CHANGES: No gross spinal canal stenosis or bony neural foraminal narrowing of the thoracic spine. SOFT TISSUES: No paraspinal mass or hematoma. LIMITED CHEST: Bilateral perihilar atelectasis. Calcific aortic atherosclerosis. LUMBAR SPINE: BONES AND ALIGNMENT: There are chronic compression fractures at L1, L3, L4, and L5, unchanged since 04/26/2024. The vertebral body heights are otherwise maintained. No osseous destructive lesion is seen. DEGENERATIVE CHANGES: No gross spinal canal stenosis or bony neural foraminal narrowing of the lumbar spine. SOFT TISSUES: No paraspinal mass or  hematoma. LIMITED RETROPERITONEUM: Limited images of the retroperitoneum demonstrate no acute abnormality. Incidental adrenal and/or renal findings do not require follow up imaging. IMPRESSION: 1. No acute findings. 2. Chronic wedge compression fracture at T11 with 25% anterior height loss, unchanged compared to thoracic spine MRI 03/11/2023. 3. Chronic compression fractures at L1, L3, L4, and L5, unchanged since 04/26/2024. Electronically signed by: Franky Stanford MD 07/30/2024 07:23 PM EST RP Workstation: [id number]   CT Thoracic Spine Wo Contrast Result Date: 07/30/2024 EXAM: CT CERVICAL, THORACIC, AND LUMBAR SPINE WITHOUT INTRAVENOUS CONTRAST 07/30/2024 06:18:28 PM TECHNIQUE: CT of the cervical, thoracic and lumbar spine was performed without the administration of intravenous contrast. Multiplanar reformatted images are provided for review. Automated exposure control, iterative reconstruction, and/or  weight based adjustment of the mA/kV was utilized to reduce the radiation dose to as low as reasonably achievable. COMPARISON: Thoracic spine MRI 03/11/2023 and prior imaging dated 04/26/2024. CLINICAL HISTORY: Bone lesion, cervical spine, incidental. FINDINGS: CERVICAL SPINE: BONES AND ALIGNMENT: There is no acute fracture or traumatic malalignment. DEGENERATIVE CHANGES: No significant degenerative changes. SOFT TISSUES: There is no prevertebral soft tissue swelling. THORACIC SPINE: BONE AND ALIGNMENT: There is a wedge compression fracture at T11 with 25% anterior height loss. This is chronic and unchanged compared to thoracic spine MRI 03/11/2023. The vertebral body heights are otherwise maintained. No osseous destructive lesion is seen. DEGENERATIVE CHANGES: No gross spinal canal stenosis or bony neural foraminal narrowing of the thoracic spine. SOFT TISSUES: No paraspinal mass or hematoma. LIMITED CHEST: Bilateral perihilar atelectasis. Calcific aortic atherosclerosis. LUMBAR SPINE: BONES AND ALIGNMENT: There  are chronic compression fractures at L1, L3, L4, and L5, unchanged since 04/26/2024. The vertebral body heights are otherwise maintained. No osseous destructive lesion is seen. DEGENERATIVE CHANGES: No gross spinal canal stenosis or bony neural foraminal narrowing of the lumbar spine. SOFT TISSUES: No paraspinal mass or hematoma. LIMITED RETROPERITONEUM: Limited images of the retroperitoneum demonstrate no acute abnormality. Incidental adrenal and/or renal findings do not require follow up imaging. IMPRESSION: 1. No acute findings. 2. Chronic wedge compression fracture at T11 with 25% anterior height loss, unchanged compared to thoracic spine MRI 03/11/2023. 3. Chronic compression fractures at L1, L3, L4, and L5, unchanged since 04/26/2024. Electronically signed by: Franky Stanford MD 07/30/2024 07:23 PM EST RP Workstation: [id number]   CT Lumbar Spine Wo Contrast Result Date: 07/30/2024 EXAM: CT CERVICAL, THORACIC, AND LUMBAR SPINE WITHOUT INTRAVENOUS CONTRAST 07/30/2024 06:18:28 PM TECHNIQUE: CT of the cervical, thoracic and lumbar spine was performed without the administration of intravenous contrast. Multiplanar reformatted images are provided for review. Automated exposure control, iterative reconstruction, and/or weight based adjustment of the mA/kV was utilized to reduce the radiation dose to as low as reasonably achievable. COMPARISON: Thoracic spine MRI 03/11/2023 and prior imaging dated 04/26/2024. CLINICAL HISTORY: Bone lesion, cervical spine, incidental. FINDINGS: CERVICAL SPINE: BONES AND ALIGNMENT: There is no acute fracture or traumatic malalignment. DEGENERATIVE CHANGES: No significant degenerative changes. SOFT TISSUES: There is no prevertebral soft tissue swelling. THORACIC SPINE: BONE AND ALIGNMENT: There is a wedge compression fracture at T11 with 25% anterior height loss. This is chronic and unchanged compared to thoracic spine MRI 03/11/2023. The vertebral body heights are otherwise maintained.  No osseous destructive lesion is seen. DEGENERATIVE CHANGES: No gross spinal canal stenosis or bony neural foraminal narrowing of the thoracic spine. SOFT TISSUES: No paraspinal mass or hematoma. LIMITED CHEST: Bilateral perihilar atelectasis. Calcific aortic atherosclerosis. LUMBAR SPINE: BONES AND ALIGNMENT: There are chronic compression fractures at L1, L3, L4, and L5, unchanged since 04/26/2024. The vertebral body heights are otherwise maintained. No osseous destructive lesion is seen. DEGENERATIVE CHANGES: No gross spinal canal stenosis or bony neural foraminal narrowing of the lumbar spine. SOFT TISSUES: No paraspinal mass or hematoma. LIMITED RETROPERITONEUM: Limited images of the retroperitoneum demonstrate no acute abnormality. Incidental adrenal and/or renal findings do not require follow up imaging. IMPRESSION: 1. No acute findings. 2. Chronic wedge compression fracture at T11 with 25% anterior height loss, unchanged compared to thoracic spine MRI 03/11/2023. 3. Chronic compression fractures at L1, L3, L4, and L5, unchanged since 04/26/2024. Electronically signed by: Franky Stanford MD 07/30/2024 07:23 PM EST RP Workstation: [id number]   DG Cervical Spine 2 or 3 views Result Date: 07/30/2024 CLINICAL DATA:  Pain. EXAM: CERVICAL SPINE - 2-3 VIEW COMPARISON:  None Available. FINDINGS: Limited evaluation of the cervical spine due to sub-optimal positioning secondary to patient condition/reported pain. No appreciable acute abnormality. IMPRESSION: Limited evaluation of the cervical spine. No appreciable acute abnormality. Electronically Signed   By: Harrietta Sherry M.D.   On: 07/30/2024 16:43   DG Chest 2 View Result Date: 07/30/2024 EXAM: 2 VIEW(S) XRAY OF THE CHEST 07/30/2024 03:40:00 PM COMPARISON: 05/30/2024 CLINICAL HISTORY: Pain pain. FINDINGS: LUNGS AND PLEURA: Mild central pulmonary vascular congestion is noted. Left basilar atelectasis infiltrate with associated effusion cannot be excluded. No  pneumothorax. HEART AND MEDIASTINUM: Stable cardiomegaly. No acute abnormality of the mediastinal silhouette. BONES AND SOFT TISSUES: No acute osseous abnormality. IMPRESSION: 1. Stable cardiomegaly with mild central pulmonary vascular congestion. 2. Left basilar atelectasis; superimposed infiltrate with associated effusion cannot be excluded. Electronically signed by: Lynwood Seip MD 07/30/2024 04:03 PM EST RP Workstation: [id number]    Microbiology: Results for orders placed or performed during the hospital encounter of 08/16/24  Resp panel by RT-PCR (RSV, Flu A&B, Covid) Anterior Nasal Swab     Status: None   Collection Time: 08/16/24  4:27 PM   Specimen: Anterior Nasal Swab  Result Value Ref Range Status   SARS Coronavirus 2 by RT PCR NEGATIVE NEGATIVE Final    Comment: (NOTE) SARS-CoV-2 target nucleic acids are NOT DETECTED.  The SARS-CoV-2 RNA is generally detectable in upper respiratory specimens during the acute phase of infection. The lowest concentration of SARS-CoV-2 viral copies this assay can detect is 138 copies/mL. A negative result does not preclude SARS-Cov-2 infection and should not be used as the sole basis for treatment or other patient management decisions. A negative result may occur with  improper specimen collection/handling, submission of specimen other than nasopharyngeal swab, presence of viral mutation(s) within the areas targeted by this assay, and inadequate number of viral copies(<138 copies/mL). A negative result must be combined with clinical observations, patient history, and epidemiological information. The expected result is Negative.  Fact Sheet for Patients:  bloggercourse.com  Fact Sheet for Healthcare Providers:  seriousbroker.it  This test is no t yet approved or cleared by the [place]  FDA and  has been authorized for detection and/or diagnosis of SARS-CoV-2 by FDA under an Emergency Use  Authorization (EUA). This EUA will remain  in effect (meaning this test can be used) for the duration of the COVID-19 declaration under Section 564(b)(1) of the Act, 21 U.S.C.section 360bbb-3(b)(1), unless the authorization is terminated  or revoked sooner.       Influenza A by PCR NEGATIVE NEGATIVE Final   Influenza B by PCR NEGATIVE NEGATIVE Final    Comment: (NOTE) The Xpert Xpress SARS-CoV-2/FLU/RSV plus assay is intended as an aid in the diagnosis of influenza from Nasopharyngeal swab specimens and should not be used as a sole basis for treatment. Nasal washings and aspirates are unacceptable for Xpert Xpress SARS-CoV-2/FLU/RSV testing.  Fact Sheet for Patients: bloggercourse.com  Fact Sheet for Healthcare Providers: seriousbroker.it  This test is not yet approved or cleared by the [place]  FDA and has been authorized for detection and/or diagnosis of SARS-CoV-2 by FDA under an Emergency Use Authorization (EUA). This EUA will remain in effect (meaning this test can be used) for the duration of the COVID-19 declaration under Section 564(b)(1) of the Act, 21 U.S.C. section 360bbb-3(b)(1), unless the authorization is terminated or revoked.     Resp Syncytial Virus by PCR NEGATIVE NEGATIVE Final    Comment: (  NOTE) Fact Sheet for Patients: bloggercourse.com  Fact Sheet for Healthcare Providers: seriousbroker.it  This test is not yet approved or cleared by the [place]  FDA and has been authorized for detection and/or diagnosis of SARS-CoV-2 by FDA under an Emergency Use Authorization (EUA). This EUA will remain in effect (meaning this test can be used) for the duration of the COVID-19 declaration under Section 564(b)(1) of the Act, 21 U.S.C. section 360bbb-3(b)(1), unless the authorization is terminated or revoked.  Performed at [facility], [address]  [place], [place] [address]   Blood culture (routine x 2)     Status: None   Collection Time: 08/16/24  5:53 PM   Specimen: BLOOD RIGHT HAND  Result Value Ref Range Status   Specimen Description BLOOD RIGHT HAND  Final   Special Requests AEROBIC BOTTLE ONLY Blood Culture adequate volume  Final   Culture   Final    NO GROWTH 5 DAYS Performed at [facility], [address] [place], [place] [address]    Report Status 08/21/2024 FINAL  Final  Blood culture (routine x 2)     Status: None   Collection Time: 08/16/24  6:00 PM   Specimen: Left Antecubital; Blood  Result Value Ref Range Status   Specimen Description LEFT ANTECUBITAL  Final   Special Requests   Final    BOTTLES DRAWN AEROBIC AND ANAEROBIC Blood Culture adequate volume   Culture   Final    NO GROWTH 5 DAYS Performed at [facility], [address] [place], [place] [address]    Report Status 08/21/2024 FINAL  Final   *Note: Due to a large number of results and/or encounters for the requested time period, some results have not been displayed. A complete set of results can be found in Results Review.    Labs: CBC: No results for input(s): WBC, NEUTROABS, HGB, HCT, MCV, PLT in the last 168 hours. Basic Metabolic Panel: No results for input(s): NA, K, CL, CO2, GLUCOSE, BUN, CREATININE, CALCIUM , MG, PHOS in the last 168 hours. Liver Function Tests: No results for input(s): AST, ALT, ALKPHOS, BILITOT, PROT, ALBUMIN in the last 168 hours. CBG: No results for input(s): GLUCAP in the last 168 hours.  Discharge time spent: greater than 30 minutes.  Signed: Nydia Distance, MD [facility] 08/26/2024 "

## 2024-08-26 NOTE — TOC Transition Note (Signed)
 Hydromorphone  picked up at [facility] outpatient pharmacy on 08/24/24

## 2024-08-26 NOTE — Plan of Care (Signed)
 ?  Problem: Education: ?Goal: Knowledge of General Education information will improve ?Description: Including pain rating scale, medication(s)/side effects and non-pharmacologic comfort measures ?Outcome: Progressing ?  ?Problem: Health Behavior/Discharge Planning: ?Goal: Ability to manage health-related needs will improve ?Outcome: Progressing ?  ?Problem: Coping: ?Goal: Level of anxiety will decrease ?Outcome: Progressing ?  ?

## 2024-08-31 ENCOUNTER — Other Ambulatory Visit: Payer: Self-pay | Admitting: Family Medicine

## 2024-08-31 ENCOUNTER — Encounter: Payer: Self-pay | Admitting: Oncology

## 2024-08-31 ENCOUNTER — Telehealth: Payer: Self-pay | Admitting: *Deleted

## 2024-08-31 ENCOUNTER — Other Ambulatory Visit: Payer: Self-pay

## 2024-08-31 ENCOUNTER — Other Ambulatory Visit (HOSPITAL_COMMUNITY): Payer: Self-pay

## 2024-08-31 MED ORDER — APIXABAN 5 MG PO TABS
5.0000 mg | ORAL_TABLET | Freq: Two times a day (BID) | ORAL | 0 refills | Status: DC
  Filled 2024-08-31: qty 60, 30d supply, fill #0

## 2024-08-31 NOTE — Progress Notes (Signed)
 Complex Care Management Care Guide Note  08/31/2024 Name: DELAYNA SPARLIN MRN: [id number] DOB: 11-22-1960  MORAYMA GODOWN is a 65 y.o. year old female who is a primary care patient of Luking, Glendia LABOR, MD and is actively engaged with the care management team. I reached out to Madelin JONELLE Collum by phone today to assist with re-scheduling  with the RN Case Manager.  Follow up plan: Telephone appointment with complex care management team member scheduled for:  09/07/24 at 1:30 p.m.   Dreama [facility]  [facility], [facility] VBCI Assistant Direct Dial: [phone]  Fax: [phone]

## 2024-08-31 NOTE — Telephone Encounter (Unsigned)
 Copied from CRM [id number]. Topic: General - Other >> Aug 31, 2024 11:30 AM Victoria A wrote: Reason for CRM: Jasmine with [facility] called and said there will be a delay in services being as though patient has appointments this week. Start of Care visit will be Monday September 07, 2024

## 2024-09-01 ENCOUNTER — Ambulatory Visit: Admitting: General Surgery

## 2024-09-01 ENCOUNTER — Encounter: Payer: Self-pay | Admitting: General Surgery

## 2024-09-01 VITALS — BP 104/73 | HR 85 | Temp 97.6°F | Resp 18 | Ht 70.0 in | Wt 240.0 lb

## 2024-09-01 DIAGNOSIS — L723 Sebaceous cyst: Secondary | ICD-10-CM | POA: Diagnosis not present

## 2024-09-02 ENCOUNTER — Other Ambulatory Visit (HOSPITAL_COMMUNITY): Payer: Self-pay

## 2024-09-02 MED ORDER — LIDOCAINE 5 % EX PTCH
1.0000 | MEDICATED_PATCH | Freq: Every day | CUTANEOUS | 0 refills | Status: AC
  Filled 2024-09-02 – 2024-09-05 (×2): qty 30, 30d supply, fill #0

## 2024-09-02 MED ORDER — HYDROMORPHONE HCL 2 MG PO TABS
2.0000 mg | ORAL_TABLET | ORAL | 0 refills | Status: DC | PRN
  Filled 2024-09-02: qty 180, 30d supply, fill #0

## 2024-09-02 NOTE — Progress Notes (Signed)
 Kathleen [name]; [id number]; [birth year]   HPI Patient is a 64 year old white female who was referred to my care by Dr. Glendia Fielding for several sebaceous cysts on her scalp.  Patient states they have been present for some time.  She has had an I&D of a sebaceous cyst on her back in the remote past.  No significant change in size has been noted.  No drainage has been noted.  They can be tender to touch.  She is currently in a wheelchair on home O2.  She is on Eliquis .  She has been multiple comorbidities including moderate to severe restrictive lung disease. Past Medical History:  Diagnosis Date   Adrenal insufficiency    Anemia    Anxiety    Avascular bone necrosis (HCC)    Breast fibrocystic disorder    Chronic pain    Encounter for long-term opiate analgesic use 02/02/2020   Patient has chronic pain with femoral head avascular necrosis.  Is under pain contract   Fibromyalgia    Gastroesophageal reflux disease    Hypothyroidism    Mitral valve prolapse    Orthostatic hypotension    Peripheral neuropathy    PONV (postoperative nausea and vomiting)    Restrictive lung disease    Moderate to severe   Sarcoidosis    Biopsy proven - [facility]   Sarcoidosis    SOB (shortness of breath)    chronic    Past Surgical History:  Procedure Laterality Date   BIOPSY  10/17/2018   Procedure: BIOPSY;  Surgeon: Golda Claudis PENNER, MD;  [facility];  Service: Endoscopy;;  duodenum, antrum, gastric body   BIOPSY  03/03/2021   Procedure: BIOPSY;  Surgeon: Golda Claudis PENNER, MD;  [facility];  Service: Endoscopy;;   BIOPSY  09/11/2023   Procedure: BIOPSY;  Surgeon: Shaaron Lamar HERO, MD;  [facility];  Service: Endoscopy;;   BREAST LUMPECTOMY Bilateral 01/12/2011   CHOLECYSTECTOMY N/A 04/17/2021   Procedure: LAPAROSCOPIC CHOLECYSTECTOMY;  Surgeon: Mavis Anes, MD;  Location: [facility];  Service: General;  Laterality: N/A;   COLONOSCOPY WITH PROPOFOL  N/A 01/03/2022    Procedure: COLONOSCOPY WITH PROPOFOL ;  Surgeon: Golda Claudis PENNER, MD;  [facility];  Service: Endoscopy;  Laterality: N/A;  220   ESOPHAGEAL DILATION N/A 03/03/2021   Procedure: ESOPHAGEAL DILATION;  Surgeon: Golda Claudis PENNER, MD;  [facility];  Service: Endoscopy;  Laterality: N/A;   ESOPHAGOGASTRODUODENOSCOPY (EGD) WITH PROPOFOL  N/A 10/17/2018   Procedure: ESOPHAGOGASTRODUODENOSCOPY (EGD) WITH PROPOFOL ;  Surgeon: Golda Claudis PENNER, MD;  [facility];  Service: Endoscopy;  Laterality: N/A;  2:25   ESOPHAGOGASTRODUODENOSCOPY (EGD) WITH PROPOFOL  N/A 03/03/2021   Procedure: ESOPHAGOGASTRODUODENOSCOPY (EGD) WITH PROPOFOL ;  Surgeon: Golda Claudis PENNER, MD;  [facility];  Service: Endoscopy;  Laterality: N/A;  12:15   ESOPHAGOGASTRODUODENOSCOPY (EGD) WITH PROPOFOL  N/A 09/11/2023   Procedure: ESOPHAGOGASTRODUODENOSCOPY (EGD) WITH PROPOFOL ;  Surgeon: Shaaron Lamar HERO, MD;  [facility];  Service: Endoscopy;  Laterality: N/A;  2:30 pm, asa 3, LM to see if pt can come earlier   [facility] DILATION N/A 09/11/2023   Procedure: AGAPITO DILATION;  Surgeon: Shaaron Lamar HERO, MD;  [facility];  Service: Endoscopy;  Laterality: N/A;   ORIF ANKLE FRACTURE Left 03/10/2023   Procedure: OPEN REDUCTION INTERNAL FIXATION (ORIF) ANKLE FRACTURE;  Surgeon: Georgina Ozell LABOR, MD;  Location: [facility];  Service: Orthopedics;  Laterality: Left;   POLYPECTOMY  01/03/2022  Procedure: POLYPECTOMY INTESTINAL;  Surgeon: Golda Claudis PENNER, MD;  [facility];  Service: Endoscopy;;   RIGHT HEART CATH N/A 05/15/2024   Procedure: RIGHT HEART CATH;  Surgeon: Zenaida Morene PARAS, MD;  Location: [facility] INVASIVE CV LAB;  Service: Cardiovascular;  Laterality: N/A;   RIGHT HEART CATH N/A 05/18/2024   Procedure: RIGHT HEART CATH;  Surgeon: Rolan Ezra RAMAN, MD;  Location: [facility] INVASIVE CV LAB;  Service: Cardiovascular;  Laterality: N/A;   TUBAL LIGATION      Family History  Problem  Relation Age of Onset   Cardiomyopathy Mother    Breast cancer Mother    Prostate cancer Father    Heart attack Maternal Grandmother    Heart attack Maternal Grandfather    Heart attack Paternal Grandmother    Bipolar disorder Daughter    Heart disease Maternal Aunt    Neurofibromatosis Cousin    Colon cancer Neg Hx     Medications Ordered Prior to Encounter[1]  Allergies[2]  Social History   Substance and Sexual Activity  Alcohol Use No    Tobacco Use History[3]  Review of Systems  Constitutional:  Positive for malaise/fatigue.  HENT: Negative.    Eyes: Negative.   Respiratory:  Positive for shortness of breath and wheezing.   Cardiovascular:  Positive for chest pain.  Gastrointestinal:  Positive for abdominal pain, heartburn and nausea.  Genitourinary:  Positive for dysuria, frequency and urgency.  Musculoskeletal:  Positive for back pain, joint pain and neck pain.  Skin: Negative.   Neurological:  Positive for headaches.  Endo/Heme/Allergies:  Bruises/bleeds easily.  Psychiatric/Behavioral: Negative.      Objective   Vitals:   09/01/24 1516  BP: 104/73  Pulse: 85  Resp: 18  Temp: 97.6 F (36.4 C)  SpO2: 95%    Physical Exam Vitals reviewed.  Constitutional:      Appearance: She is not ill-appearing.  HENT:     Head: Normocephalic and atraumatic.     Comments: Two subcutaneous less than 1 cm mobile nodules noted on top and posterior aspects of scalp.  No drainage is noted.  Somewhat tender to touch. Cardiovascular:     Rate and Rhythm: Normal rate and regular rhythm.     Heart sounds: Normal heart sounds. No murmur heard.    No friction rub. No gallop.  Pulmonary:     Effort: Pulmonary effort is normal. No respiratory distress.     Breath sounds: No stridor. Wheezing present. No rhonchi or rales.     Comments: On nasal cannula Neurological:     Mental Status: She is alert and oriented to person, place, and time.     Assessment  Inclusion  cysts, scalp Plan  I told the patient that I would not recommend excision of the cysts as she has multiple comorbidities and I would need to do this under conscious sedation.  She understands this.  Should they become infected, would recommend antibiotic care and possible I&D at a future date.  Follow-up here as needed.    [1]  Current Outpatient Medications on File Prior to Visit  Medication Sig Dispense Refill   acetaminophen  (TYLENOL ) 325 MG tablet Take 2 tablets (650 mg total) by mouth every 6 (six) hours as needed for mild pain (pain score 1-3) or fever (or Fever >/= 101).     alendronate  (FOSAMAX ) 70 MG tablet Take 1 tablet (70 mg total) by mouth every Saturday. Take with a full glass of water  on an empty stomach. 5 tablet  8   apixaban  (ELIQUIS ) 5 MG TABS tablet Take 1 tablet (5 mg total) by mouth 2 (two) times daily. 60 tablet 0   budesonide  (PULMICORT ) 0.5 MG/2ML nebulizer solution Take 0.5 mg by nebulization 2 (two) times daily.     buprenorphine  (BUTRANS ) 15 MCG/HR Place onto the skin every 7 (seven) days for 28 days. **Remove old patch, fold and discard away from children and pets.** 4 patch 0   busPIRone  (BUSPAR ) 15 MG tablet Take 1 tablet (15 mg) by mouth 3 times daily. 90 tablet 5   calcitonin, salmon, (MIACALCIN /FORTICAL) 200 UNIT/ACT nasal spray Place 1 spray into alternate nostrils daily. 3.7 mL 0   Calcium  Citrate 333 MG TABS Take one tablet by mouth 3 (three) times daily with meals. 270 tablet 1   DULoxetine  (CYMBALTA ) 30 MG capsule Take 1 capsule (30 mg total) by mouth daily. 30 capsule 1   ferrous sulfate  (FEROSUL) 325 (65 FE) MG tablet Take 1 tablet (325 mg total) by mouth once a week. 4 tablet 0   folic acid  (FOLVITE ) 1 MG tablet Take 1 tablet (1 mg total) by mouth daily. 30 tablet 0   gabapentin  (NEURONTIN ) 300 MG capsule Take 1 capsule (300 mg total) by mouth 4 (four) times daily. 100 capsule 0   hydrocortisone  (CORTEF ) 10 MG tablet Take 2 tablets by mouth at 8:00 am and  1 tablet at lunch. (May double dose on sick days x 3-5 days) 120 tablet 3   HYDROmorphone  (DILAUDID ) 2 MG tablet Take 0.5 tablets (1 mg total) by mouth every 4 (four) hours as needed for severe pain (pain score 7-10).     levothyroxine  (SYNTHROID ) 50 MCG tablet Take 1 tablet by mouth daily before breakfast. 30 tablet 5   lidocaine  (LIDODERM ) 5 % Place 1 patch onto the skin daily. Remove & Discard patch within 12 hours or as directed by MD 30 patch 0   linaclotide  (LINZESS ) 145 MCG CAPS capsule Take 1 capsule (145 mcg total) by mouth daily before breakfast. 30 capsule 3   LORazepam  (ATIVAN ) 0.5 MG tablet Take 1 tablet (0.5 mg total) by mouth 2 (two) times daily as needed for anxiety. 30 tablet 0   methocarbamol  (ROBAXIN ) 750 MG tablet Take 1 tablet (750 mg total) by mouth 3 (three) times daily. 90 tablet 2   midodrine  (PROAMATINE ) 10 MG tablet Take 1 tablet (10 mg total) by mouth 3 (three) times daily with meals. 90 tablet 3   ondansetron  (ZOFRAN -ODT) 8 MG disintegrating tablet Dissolve 1 tablet (8 mg total) by mouth every 8 (eight) hours as needed. 15 tablet 5   [Paused] oxyCODONE  (ROXICODONE ) 15 MG immediate release tablet Take 1 tablet (15 mg total) by mouth every 4 (four) hours as needed for pain. (Patient taking differently: Take 15 mg by mouth every 4 (four) hours.) 180 tablet 0   OXYGEN  Inhale 5 L into the lungs continuous.     pantoprazole  (PROTONIX ) 40 MG tablet Take 1 tablet (40 mg total) by mouth 2 (two) times daily. 60 tablet 5   polyethylene glycol (MIRALAX  / GLYCOLAX ) 17 g packet Take 17 g by mouth 2 (two) times daily. 60 each 4   potassium chloride  (KLOR-CON  M) 10 MEQ tablet Take 1 tablet (10 mEq total) by mouth 2 (two) times daily. 60 tablet 5   revefenacin  (YUPELRI ) 175 MCG/3ML nebulizer solution Take 3 mLs (175 mcg total) by nebulization daily. 90 mL 0   rosuvastatin  (CRESTOR ) 20 MG tablet Take 1 tablet (20 mg total)  by mouth daily. 30 tablet 5   senna-docusate (SENOKOT-S) 8.6-50 MG  tablet Take 2 tablets by mouth at bedtime. 60 tablet 3   topiramate  (TOPAMAX ) 50 MG tablet Take 1 tablet (50 mg total) by mouth daily with lunch. 90 tablet 1   torsemide  (DEMADEX ) 20 MG tablet Take 2 tablets by mouth every morning, may take 1 additional torsemide  at 1 PM when weight gain is more than 2 pounds in 1 day 90 tablet 5   Vitamin D , Ergocalciferol , (DRISDOL ) 1.25 MG (50000 UNIT) CAPS capsule Take 1 capsule (50,000 Units total) by mouth every Sunday. 5 capsule 0   Capsaicin -Menthol -Methyl Sal (CAPSAICIN -METHYL SAL-MENTHOL ) 0.025-1-12 % CREA Apply 1 Application topically 3 (three) times daily as needed. (Patient not taking: Reported on 09/01/2024) 56.6 g 0   Semaglutide ,0.25 or 0.5MG /DOS, (OZEMPIC , 0.25 OR 0.5 MG/DOSE,) 2 MG/3ML SOPN Inject 0.25 mg into the skin once a week. (Patient not taking: Reported on 09/01/2024) 9 mL 0   No current facility-administered medications on file prior to visit.  [2]  Allergies Allergen Reactions   Bee Venom Anaphylaxis   Contrast Media [Iodinated Contrast Media] Other (See Comments)    CT chest w/ contrast at OSH followed by SOB resolved w/ single dose steroids, never ENT swelling, intubation or pressors, has had multiple contrasted scans since   Bactrim  [Sulfamethoxazole -Trimethoprim ] Nausea And Vomiting   Codeine Itching  [3]  Social History Tobacco Use  Smoking Status Never   Passive exposure: Never  Smokeless Tobacco Never

## 2024-09-03 ENCOUNTER — Other Ambulatory Visit (HOSPITAL_COMMUNITY): Payer: Self-pay

## 2024-09-04 ENCOUNTER — Inpatient Hospital Stay: Admitting: Family Medicine

## 2024-09-05 ENCOUNTER — Other Ambulatory Visit (HOSPITAL_COMMUNITY): Payer: Self-pay

## 2024-09-07 ENCOUNTER — Telehealth: Payer: Self-pay | Admitting: *Deleted

## 2024-09-07 ENCOUNTER — Encounter: Payer: Self-pay | Admitting: *Deleted

## 2024-09-07 NOTE — Patient Instructions (Signed)
 Kathleen [name] - At your request, I have rescheduled our call to 09-09-2024 at 11:30 am. I work with Alphonsa Glendia LABOR, MD and am calling to support your healthcare needs. Please contact me at [phone] at your earliest convenience. I look forward to speaking with you soon.   Thank you,  [name], BSN RN [facility], [facility] RN Care Manager Direct Dial: [phone]  Fax: [phone]

## 2024-09-08 ENCOUNTER — Ambulatory Visit (HOSPITAL_COMMUNITY)
Admission: RE | Admit: 2024-09-08 | Discharge: 2024-09-08 | Disposition: A | Discharge status: Home / Self Care | Source: Ambulatory Visit | Attending: Family Medicine | Admitting: Family Medicine

## 2024-09-08 ENCOUNTER — Other Ambulatory Visit (HOSPITAL_COMMUNITY): Payer: Self-pay

## 2024-09-08 ENCOUNTER — Ambulatory Visit (INDEPENDENT_AMBULATORY_CARE_PROVIDER_SITE_OTHER): Admitting: Family Medicine

## 2024-09-08 ENCOUNTER — Other Ambulatory Visit: Payer: Self-pay

## 2024-09-08 ENCOUNTER — Telehealth: Payer: Self-pay | Admitting: *Deleted

## 2024-09-08 VITALS — BP 85/48 | HR 78 | Temp 98.1°F | Ht 70.0 in

## 2024-09-08 DIAGNOSIS — J849 Interstitial pulmonary disease, unspecified: Secondary | ICD-10-CM | POA: Diagnosis present

## 2024-09-08 DIAGNOSIS — S22000A Wedge compression fracture of unspecified thoracic vertebra, initial encounter for closed fracture: Secondary | ICD-10-CM | POA: Diagnosis not present

## 2024-09-08 DIAGNOSIS — Z8701 Personal history of pneumonia (recurrent): Secondary | ICD-10-CM

## 2024-09-08 DIAGNOSIS — D509 Iron deficiency anemia, unspecified: Secondary | ICD-10-CM

## 2024-09-08 DIAGNOSIS — Z79899 Other long term (current) drug therapy: Secondary | ICD-10-CM | POA: Diagnosis not present

## 2024-09-08 MED ORDER — OXYCODONE HCL 15 MG PO TABS
15.0000 mg | ORAL_TABLET | Freq: Four times a day (QID) | ORAL | 0 refills | Status: AC | PRN
  Filled 2024-09-08: qty 120, 30d supply, fill #0

## 2024-09-08 NOTE — Telephone Encounter (Unsigned)
 Copied from CRM #[id number]. Topic: Clinical - Request for Lab/Test Order >> Sep 07, 2024  4:44 PM Delon HERO wrote: Reason for CRM: Odis is calling from [facility] is calling to request orders for PT. Frequency- 2 W 2, 1 W 4 CB- [phone] Verbal ok VM

## 2024-09-08 NOTE — Progress Notes (Signed)
" ° °  Subjective:    Patient ID: Kathleen [name], female    DOB: 1961/06/26, 63 y.o.   MRN: [id number]  HPI Patient is in room 6  Patient is here for a hospital follow up. Patient had pneumonia and still feels like she still has it.   Patient is in extreme back pain Patient is under the care of pain management in the [facility] Patient does her best she can but has 2 compression fractures She states that the previous medication that she was placed on to try to help with pain management unfortunately is causing her to feel bad and she would prefer to go back to the oxycodone  I have encouraged her to connect with her pain management team  She also has osteoporosis and is on Fosamax  once a week but has compression fractures in the spine she has an appointment coming up with Kathleen [name] she will discuss Prolia with him and see if that is a better option and also if that is an option she can afford  Patient in the hospital recently with pneumonia but she feels she is doing some better but relates ongoing cough she wonders if she still has some pneumonia she has interstitial lung disease we will go ahead with follow-up x-ray She also had mild changes in kidney function as well as CBC she will need to have follow-up lab work  Review of Systems     Objective:   Physical Exam General-in no acute distress Eyes-no discharge Lungs-respiratory rate normal, CTA CV-no murmurs,RRR Extremities skin warm dry no edema Neuro grossly normal Behavior normal, alert Patient relates mild pain with sitting and worse with movement no respiratory distress today       Assessment & Plan:  1. Interstitial lung disease (HCC) (Primary) X-ray I do not hear any pneumonia If high fevers progressive troubles or difficulty breathing go back to ER - DG Chest 2 View - Basic metabolic panel with GFR  2. History of bacterial pneumonia X-ray pending - DG Chest 2 View  3. Iron  deficiency anemia, unspecified iron   deficiency anemia type Labs ordered Hemoglobin relatively stable Make sure were not seeing iron  deficiency compounding this - Iron , TIBC and Ferritin Panel - CBC with Differential/Platelet  4. High risk medication use Lab work to recheck kidney function - Basic metabolic panel with GFR  5. Compression fracture of body of thoracic vertebra (HCC) On Fosamax  currently-patient also using calcitonin for the compression fracture Patient states that hydromorphone  is not doing enough for her pain I did encourage her to consider connecting with her pain medicine doctor regarding this issue since we are not the ones who prescribed this she is interested in going back to oxycodone  Outside of our purview  I have sent a message to her endocrinologist regarding whether or not Prolia may be a better choice  Keep all regular follow-ups "

## 2024-09-08 NOTE — Telephone Encounter (Signed)
May have verbal order as requested 

## 2024-09-09 ENCOUNTER — Other Ambulatory Visit (HOSPITAL_COMMUNITY): Payer: Self-pay

## 2024-09-09 ENCOUNTER — Encounter: Payer: Self-pay | Admitting: *Deleted

## 2024-09-09 ENCOUNTER — Telehealth: Payer: Self-pay | Admitting: *Deleted

## 2024-09-09 NOTE — Patient Instructions (Signed)
 Kathleen [name] - I am sorry I was unable to reach you today for our scheduled appointment. I work with Alphonsa Glendia LABOR, MD and am calling to support your healthcare needs. Please contact me at [phone] at your earliest convenience. I look forward to speaking with you soon.   Thank you,  [name], BSN RN [facility], [facility] RN Care Manager Direct Dial: [phone]  Fax: [phone]

## 2024-09-10 ENCOUNTER — Other Ambulatory Visit: Payer: Self-pay

## 2024-09-10 ENCOUNTER — Ambulatory Visit: Payer: Self-pay | Admitting: Family Medicine

## 2024-09-10 MED ORDER — DOXYCYCLINE HYCLATE 100 MG PO TABS: 100.0000 mg | ORAL_TABLET | Freq: Two times a day (BID) | ORAL | 0 refills | Status: DC

## 2024-09-11 ENCOUNTER — Encounter: Payer: Self-pay | Admitting: Oncology

## 2024-09-11 ENCOUNTER — Other Ambulatory Visit: Payer: Self-pay | Admitting: Nurse Practitioner

## 2024-09-11 ENCOUNTER — Other Ambulatory Visit: Payer: Self-pay

## 2024-09-11 ENCOUNTER — Other Ambulatory Visit (HOSPITAL_COMMUNITY): Payer: Self-pay

## 2024-09-11 MED ORDER — HYDROXYZINE HCL 10 MG PO TABS
10.0000 mg | ORAL_TABLET | Freq: Three times a day (TID) | ORAL | 1 refills | Status: AC | PRN
  Filled 2024-09-11: qty 45, 15d supply, fill #0
  Filled 2024-09-22 – 2024-09-23 (×2): qty 45, 15d supply, fill #1

## 2024-09-11 NOTE — Telephone Encounter (Signed)
 Called and left a message for verbal ok for PT [facility]

## 2024-09-14 ENCOUNTER — Other Ambulatory Visit (HOSPITAL_COMMUNITY): Payer: Self-pay

## 2024-09-15 ENCOUNTER — Other Ambulatory Visit (HOSPITAL_COMMUNITY): Payer: Self-pay

## 2024-09-15 ENCOUNTER — Other Ambulatory Visit: Payer: Self-pay

## 2024-09-18 ENCOUNTER — Other Ambulatory Visit (HOSPITAL_COMMUNITY): Payer: Self-pay

## 2024-09-18 ENCOUNTER — Other Ambulatory Visit: Payer: Self-pay

## 2024-09-18 ENCOUNTER — Encounter: Payer: Self-pay | Admitting: Oncology

## 2024-09-21 ENCOUNTER — Ambulatory Visit: Admitting: "Endocrinology

## 2024-09-22 ENCOUNTER — Other Ambulatory Visit: Payer: Self-pay | Admitting: Family Medicine

## 2024-09-22 ENCOUNTER — Encounter: Payer: Self-pay | Admitting: Oncology

## 2024-09-22 ENCOUNTER — Other Ambulatory Visit (HOSPITAL_COMMUNITY): Payer: Self-pay

## 2024-09-22 ENCOUNTER — Encounter: Payer: Self-pay | Admitting: Family Medicine

## 2024-09-22 DIAGNOSIS — I480 Paroxysmal atrial fibrillation: Secondary | ICD-10-CM | POA: Insufficient documentation

## 2024-09-22 MED ORDER — APIXABAN 5 MG PO TABS
5.0000 mg | ORAL_TABLET | Freq: Two times a day (BID) | ORAL | 6 refills | Status: AC
  Filled 2024-09-22: qty 60, 30d supply, fill #0

## 2024-09-23 ENCOUNTER — Other Ambulatory Visit: Payer: Self-pay

## 2024-09-23 ENCOUNTER — Other Ambulatory Visit (HOSPITAL_COMMUNITY): Payer: Self-pay

## 2024-09-23 ENCOUNTER — Telehealth: Payer: Self-pay | Admitting: *Deleted

## 2024-09-23 NOTE — Patient Instructions (Signed)
 Visit Information  Thank you for taking time to visit with me today. Please don't hesitate to contact me if I can be of assistance to you before our next scheduled appointment.  Our next appointment is no further scheduled appointments.   Please call the care guide team at [phone] if you need to cancel or reschedule your appointment.   Please call the Suicide and Crisis Lifeline: 988 call the [place]  National Suicide Prevention Lifeline: (506)820-5096 or TTY: 3301240226 TTY 804-744-4625) to talk to a trained counselor call 1-800-273-TALK (toll free, 24 hour hotline) if you are experiencing a Mental Health or Behavioral Health Crisis or need someone to talk to.  Patient verbalized understanding of Care plan and visit instructions communicated this visit  [name], BSN RN [facility], [facility] RN Care Manager Direct Dial: [phone]  Fax: [phone]

## 2024-09-23 NOTE — Patient Outreach (Signed)
 Complex Care Management   Visit Note  09/23/2024  Name:  Kathleen [name] MRN: [id number] DOB: 1960/09/22  Situation: Referral received for Complex Care Management related to Health Plan Referral I obtained verbal consent from Patient.  Visit completed with Patient  on the phone  Background:   Past Medical History:  Diagnosis Date   Adrenal insufficiency    Anemia    Anxiety    Avascular bone necrosis (HCC)    Breast fibrocystic disorder    Chronic pain    Encounter for long-term opiate analgesic use 02/02/2020   Patient has chronic pain with femoral head avascular necrosis.  Is under pain contract   Fibromyalgia    Gastroesophageal reflux disease    Hypothyroidism    Mitral valve prolapse    Orthostatic hypotension    Peripheral neuropathy    PONV (postoperative nausea and vomiting)    Restrictive lung disease    Moderate to severe   Sarcoidosis    Biopsy proven - [facility]   Sarcoidosis    SOB (shortness of breath)    chronic    Assessment: Patient Reported Symptoms:  Cognitive Cognitive Status: No symptoms reported Cognitive/Intellectual Conditions Management [RPT]: None reported or documented in medical history or problem list      Neurological Neurological Review of Symptoms: No symptoms reported Neurological Management Strategies: Routine screening Neurological Self-Management Outcome: 4 (good)  HEENT HEENT Symptoms Reported: No symptoms reported HEENT Management Strategies: Routine screening HEENT Self-Management Outcome: 4 (good)    Cardiovascular Cardiovascular Symptoms Reported: No symptoms reported Does patient have uncontrolled Hypertension?: No Cardiovascular Management Strategies: Medication therapy Cardiovascular Self-Management Outcome: 4 (good)  Respiratory Respiratory Symptoms Reported: No symptoms reported Respiratory Management Strategies: Routine screening Respiratory Self-Management Outcome: 4 (good)  Endocrine Endocrine Symptoms Reported: No  symptoms reported Is patient diabetic?: No Endocrine Self-Management Outcome: 4 (good)  Gastrointestinal Gastrointestinal Symptoms Reported: No symptoms reported Gastrointestinal Self-Management Outcome: 4 (good)    Genitourinary Genitourinary Symptoms Reported: No symptoms reported Genitourinary Self-Management Outcome: 5 (very good)  Integumentary Integumentary Symptoms Reported: No symptoms reported Skin Management Strategies: Routine screening Skin Self-Management Outcome: 4 (good)  Musculoskeletal Musculoskelatal Symptoms Reviewed: Limited mobility, Difficulty walking, Back pain, Weakness, Unsteady gait, Muscle pain, Joint pain Musculoskeletal Self-Management Outcome: 3 (uncertain) Musculoskeletal Comment: Currently doing PT Falls in the past year?: No Number of falls in past year: 1 or less Was there an injury with Fall?: No Fall Risk Category Calculator: 0 Patient Fall Risk Level: Low Fall Risk Fall risk Follow up: Falls evaluation completed  Psychosocial Psychosocial Symptoms Reported: No symptoms reported Behavioral Management Strategies: Coping strategies Behavioral Health Self-Management Outcome: 4 (good) Major Change/Loss/Stressor/Fears (CP): Denies Quality of Family Relationships: supportive Do you feel physically threatened by others?: No    09/23/2024    PHQ2-9 Depression Screening   Little interest or pleasure in doing things Not at all  Feeling down, depressed, or hopeless Not at all  PHQ-2 - Total Score 0  Trouble falling or staying asleep, or sleeping too much    Feeling tired or having little energy    Poor appetite or overeating     Feeling bad about yourself - or that you are a failure or have let yourself or your family down    Trouble concentrating on things, such as reading the newspaper or watching television    Moving or speaking so slowly that other people could have noticed.  Or the opposite - being so fidgety or restless that you have been moving  around a lot more than usual    Thoughts that you would be better off dead, or hurting yourself in some way    PHQ2-9 Total Score    If you checked off any problems, how difficult have these problems made it for you to do your work, take care of things at home, or get along with other people    Depression Interventions/Treatment      There were no vitals filed for this visit. Pain Scale: 0-10 Pain Score: 10-Worst pain ever Pain Type: Chronic pain Pain Location: Back Pain Intervention(s): Hot/Cold interventions  Medications Reviewed Today     Reviewed by Bertrum Rosina HERO, RN (Registered Nurse) on 09/23/24 at 1010  Med List Status: <None>   Medication Order Taking? Sig Documenting Provider Last Dose Status Informant  acetaminophen  (TYLENOL ) 325 MG tablet [id number] Yes Take 2 tablets (650 mg total) by mouth every 6 (six) hours as needed for mild pain (pain score 1-3) or fever (or Fever >/= 101). Pegge Toribio PARAS, PA-C  Active Pharmacy Records, Spouse/Significant Other, Self  alendronate  (FOSAMAX ) 70 MG tablet [id number] Yes Take 1 tablet (70 mg total) by mouth every Saturday. Take with a full glass of water  on an empty stomach. Alphonsa Glendia LABOR, MD  Active Pharmacy Records, Spouse/Significant Other, Self  apixaban  (ELIQUIS ) 5 MG TABS tablet [id number] Yes Take 1 tablet (5 mg total) by mouth 2 (two) times daily. Alphonsa Glendia LABOR, MD  Active   budesonide  (PULMICORT ) 0.5 MG/2ML nebulizer solution [id number] Yes Take 0.5 mg by nebulization 2 (two) times daily. [provider]  Active            Med Note AMADOR, [place] D   Tue Aug 18, 2024  3:51 PM) Patient uses PRN instead of BID  busPIRone  (BUSPAR ) 15 MG tablet [id number] Yes Take 1 tablet (15 mg) by mouth 3 times daily. Alphonsa Glendia LABOR, MD  Active Pharmacy Records, Spouse/Significant Other, Self  calcitonin, salmon, (MIACALCIN /FORTICAL) 200 UNIT/ACT nasal spray [id number] Yes Place 1 spray into alternate nostrils daily.   Active  Spouse/Significant Other, Pharmacy Records, Self  Calcium  Citrate 333 MG TABS [id number] Yes Take one tablet by mouth 3 (three) times daily with meals. Nida, Gebreselassie W, MD  Active Pharmacy Records, Spouse/Significant Other, Self  Capsaicin -Menthol -Methyl Sal (CAPSAICIN -METHYL SAL-MENTHOL ) 0.025-1-12 % CREA [id number] Yes Apply 1 Application topically 3 (three) times daily as needed. Patsy Lenis, MD  Active Spouse/Significant Other, Pharmacy Records, Self  doxycycline  (VIBRA -TABS) 100 MG tablet [id number]  Take 1 tablet (100 mg total) by mouth 2 (two) times daily.  Patient not taking: Reported on 09/23/2024   Alphonsa Glendia LABOR, MD  Consider Medication Status and Discontinue   DULoxetine  (CYMBALTA ) 30 MG capsule [id number] Yes Take 1 capsule (30 mg total) by mouth daily. Rai, Nydia POUR, MD  Active   ferrous sulfate  (FEROSUL) 325 (65 FE) MG tablet [id number] Yes Take 1 tablet (325 mg total) by mouth once a week. Alphonsa Glendia LABOR, MD  Active Spouse/Significant Other, Pharmacy Records, Self  folic acid  (FOLVITE ) 1 MG tablet [id number] Yes Take 1 tablet (1 mg total) by mouth daily. Alphonsa Glendia LABOR, MD  Active Spouse/Significant Other, Pharmacy Records, Self  gabapentin  (NEURONTIN ) 300 MG capsule [id number] Yes Take 1 capsule (300 mg total) by mouth 4 (four) times daily. Suzette Pac, MD  Active Spouse/Significant Other, Pharmacy Records, Self  hydrocortisone  (CORTEF ) 10 MG tablet [id number] Yes Take 2 tablets by mouth at 8:00 am and 1 tablet at lunch. (May double dose on  sick days x 3-5 days) Nida, Gebreselassie W, MD  Active Spouse/Significant Other, Pharmacy Records, Self  HYDROmorphone  (DILAUDID ) 2 MG tablet [id number]  Take 0.5 tablets (1 mg total) by mouth every 4 (four) hours as needed for severe pain (pain score 7-10).  Patient not taking: Reported on 09/23/2024   Davia Nydia POUR, MD  Consider Medication Status and Discontinue   HYDROmorphone  (DILAUDID ) 2 MG tablet [id number]  Take 1 tablet (2 mg total)  by mouth every 4 (four) hours as needed for pain  Patient not taking: Reported on 09/23/2024     Active   hydrOXYzine  (ATARAX ) 10 MG tablet [id number] Yes Take 1 tablet (10 mg total) by mouth 3 (three) times daily as needed for anxiety Luking, Glendia LABOR, MD  Active   levothyroxine  (SYNTHROID ) 50 MCG tablet [id number] Yes Take 1 tablet by mouth daily before breakfast. Alphonsa Glendia LABOR, MD  Active Pharmacy Records, Spouse/Significant Other, Self  lidocaine  (LIDODERM ) 5 % [id number] Yes Place 1 patch onto the skin daily.   Active   linaclotide  (LINZESS ) 145 MCG CAPS capsule [id number] Yes Take 1 capsule (145 mcg total) by mouth daily before breakfast. Alphonsa Glendia LABOR, MD  Active Spouse/Significant Other, Pharmacy Records, Self  LORazepam  (ATIVAN ) 0.5 MG tablet [id number]  Take 1 tablet (0.5 mg total) by mouth 2 (two) times daily as needed for anxiety.  Patient not taking: Reported on 09/23/2024   Davia Nydia POUR, MD  Active   methocarbamol  (ROBAXIN ) 750 MG tablet [id number] Yes Take 1 tablet (750 mg total) by mouth 3 (three) times daily. Rai, Nydia POUR, MD  Active   midodrine  (PROAMATINE ) 10 MG tablet [id number] Yes Take 1 tablet (10 mg total) by mouth 3 (three) times daily with meals. Rai, Nydia POUR, MD  Active   ondansetron  (ZOFRAN -ODT) 8 MG disintegrating tablet [id number] Yes Dissolve 1 tablet (8 mg total) by mouth every 8 (eight) hours as needed. Alphonsa Glendia LABOR, MD  Active Spouse/Significant Other, Pharmacy Records, Self  oxyCODONE  (ROXICODONE ) 15 MG immediate release tablet [id number] Yes Take 1 tablet (15 mg total) by mouth every 4 (four) hours as needed for pain.   Active Spouse/Significant Other, Pharmacy Records, Self  oxyCODONE  (ROXICODONE ) 15 MG immediate release tablet [id number] Yes Take 1 tablet (15 mg total) by mouth 4 (four) times daily as needed. Discontinue hydromorphone .   Active   OXYGEN  [id number] Yes Inhale 5 L into the lungs continuous. [provider]  Active Pharmacy Records,  Spouse/Significant Other, Self  pantoprazole  (PROTONIX ) 40 MG tablet [id number] Yes Take 1 tablet (40 mg total) by mouth 2 (two) times daily. Alphonsa Glendia LABOR, MD  Active Pharmacy Records, Spouse/Significant Other, Self  polyethylene glycol (MIRALAX  / GLYCOLAX ) 17 g packet [id number] Yes Take 17 g by mouth 2 (two) times daily. Pearlean Manus, MD  Active Pharmacy Records, Spouse/Significant Other, Self  potassium chloride  (KLOR-CON  M) 10 MEQ tablet [id number] Yes Take 1 tablet (10 mEq total) by mouth 2 (two) times daily. Alphonsa Glendia LABOR, MD  Active Spouse/Significant Other, Pharmacy Records, Self  revefenacin  (YUPELRI ) 175 MCG/3ML nebulizer solution [id number] Yes Take 3 mLs (175 mcg total) by nebulization daily. Pegge Toribio PARAS, PA-C  Active Pharmacy Records, Spouse/Significant Other, Self  rosuvastatin  (CRESTOR ) 20 MG tablet [id number] Yes Take 1 tablet (20 mg total) by mouth daily. Alphonsa Glendia LABOR, MD  Active Pharmacy Records, Spouse/Significant Other, Self  Semaglutide ,0.25 or 0.5MG /DOS, (OZEMPIC , 0.25 OR 0.5 MG/DOSE,) 2 MG/3ML SOPN [id number]  Inject 0.25 mg into the skin once a week.  Patient not taking: Reported on 09/23/2024   Nida, Gebreselassie W, MD  Active Spouse/Significant Other, Pharmacy Records, Self  senna-docusate (SENOKOT-S) 8.6-50 MG tablet [id number] Yes Take 2 tablets by mouth at bedtime. Pearlean Manus, MD  Active Pharmacy Records, Spouse/Significant Other, Self  topiramate  (TOPAMAX ) 50 MG tablet [id number] Yes Take 1 tablet (50 mg total) by mouth daily with lunch. Nida, Gebreselassie W, MD  Active Pharmacy Records, Spouse/Significant Other, Self  torsemide  (DEMADEX ) 20 MG tablet [id number] Yes Take 2 tablets by mouth every morning, may take 1 additional torsemide  at 1 PM when weight gain is more than 2 pounds in 1 day Luking, Glendia LABOR, MD  Active Spouse/Significant Other, Pharmacy Records, Self  Vitamin D , Ergocalciferol , (DRISDOL ) 1.25 MG (50000 UNIT) CAPS capsule [id number] Yes Take 1  capsule (50,000 Units total) by mouth every Sunday. Angiulli, Toribio PARAS, PA-C  Active Pharmacy Records, Spouse/Significant Other, Self  Med List Note (Ward, Angelica, CPhT 01/21/24 1835): [facility] for long term meds and [facility] for short term meds            Recommendation:   Continue Current Plan of Care  Follow Up Plan:   Closing From:  Complex Care Management - Patient declined to enroll in program.  [name], BSN RN [facility], [facility] RN Care Manager Direct Dial: [phone]  Fax: [phone]

## 2024-09-24 ENCOUNTER — Encounter: Payer: Self-pay | Admitting: "Endocrinology

## 2024-09-24 ENCOUNTER — Other Ambulatory Visit (HOSPITAL_COMMUNITY): Payer: Self-pay

## 2024-09-24 ENCOUNTER — Telehealth (HOSPITAL_COMMUNITY): Payer: Self-pay

## 2024-09-24 ENCOUNTER — Ambulatory Visit: Admitting: "Endocrinology

## 2024-09-24 VITALS — BP 116/66 | HR 88

## 2024-09-24 DIAGNOSIS — E2740 Unspecified adrenocortical insufficiency: Secondary | ICD-10-CM

## 2024-09-24 DIAGNOSIS — E782 Mixed hyperlipidemia: Secondary | ICD-10-CM

## 2024-09-24 DIAGNOSIS — E039 Hypothyroidism, unspecified: Secondary | ICD-10-CM | POA: Diagnosis not present

## 2024-09-24 DIAGNOSIS — M8000XA Age-related osteoporosis with current pathological fracture, unspecified site, initial encounter for fracture: Secondary | ICD-10-CM

## 2024-09-24 LAB — CBC WITH DIFFERENTIAL/PLATELET
Basophils Absolute: 0.1 10*3/uL (ref 0.0–0.2)
Basos: 1 %
EOS (ABSOLUTE): 0.4 10*3/uL (ref 0.0–0.4)
Eos: 6 %
Hematocrit: 31 % — ABNORMAL LOW (ref 34.0–46.6)
Hemoglobin: 9.7 g/dL — ABNORMAL LOW (ref 11.1–15.9)
Immature Grans (Abs): 0.2 10*3/uL — ABNORMAL HIGH (ref 0.0–0.1)
Immature Granulocytes: 3 %
Lymphocytes Absolute: 1.1 10*3/uL (ref 0.7–3.1)
Lymphs: 15 %
MCH: 28.5 pg (ref 26.6–33.0)
MCHC: 31.3 g/dL — ABNORMAL LOW (ref 31.5–35.7)
MCV: 91 fL (ref 79–97)
Monocytes Absolute: 0.5 10*3/uL (ref 0.1–0.9)
Monocytes: 7 %
Neutrophils Absolute: 5 10*3/uL (ref 1.4–7.0)
Neutrophils: 68 %
Platelets: 463 10*3/uL — ABNORMAL HIGH (ref 150–450)
RBC: 3.4 x10E6/uL — ABNORMAL LOW (ref 3.77–5.28)
RDW: 17.4 % — ABNORMAL HIGH (ref 11.7–15.4)
WBC: 7.4 10*3/uL (ref 3.4–10.8)

## 2024-09-24 LAB — IRON,TIBC AND FERRITIN PANEL
Ferritin: 302 ng/mL — ABNORMAL HIGH (ref 15–150)
Iron Saturation: 24 % (ref 15–55)
Iron: 52 ug/dL (ref 27–139)
Total Iron Binding Capacity: 220 ug/dL — ABNORMAL LOW (ref 250–450)
UIBC: 168 ug/dL (ref 118–369)

## 2024-09-24 LAB — BASIC METABOLIC PANEL WITH GFR
BUN/Creatinine Ratio: 11 — ABNORMAL LOW (ref 12–28)
BUN: 10 mg/dL (ref 8–27)
CO2: 27 mmol/L (ref 20–29)
Calcium: 9.2 mg/dL (ref 8.7–10.3)
Chloride: 100 mmol/L (ref 96–106)
Creatinine, Ser: 0.9 mg/dL (ref 0.57–1.00)
Glucose: 160 mg/dL — ABNORMAL HIGH (ref 70–99)
Potassium: 4.5 mmol/L (ref 3.5–5.2)
Sodium: 142 mmol/L (ref 134–144)
eGFR: 73 mL/min/{1.73_m2}

## 2024-09-24 MED ORDER — OZEMPIC (0.25 OR 0.5 MG/DOSE) 2 MG/3ML ~~LOC~~ SOPN
0.2500 mg | PEN_INJECTOR | SUBCUTANEOUS | 1 refills | Status: AC
  Filled 2024-09-24: qty 3, 56d supply, fill #0

## 2024-09-24 NOTE — Progress Notes (Signed)
 "                                                     09/24/2024, 10:54 AM     Endocrinology follow-up note  Subjective:    Patient ID: Kathleen [name], female    DOB: 1960-09-30, PCP Alphonsa Glendia LABOR, MD   Past Medical History:  Diagnosis Date   Adrenal insufficiency    Anemia    Anxiety    Avascular bone necrosis (HCC)    Breast fibrocystic disorder    Chronic pain    Encounter for long-term opiate analgesic use 02/02/2020   Patient has chronic pain with femoral head avascular necrosis.  Is under pain contract   Fibromyalgia    Gastroesophageal reflux disease    Hypothyroidism    Mitral valve prolapse    Orthostatic hypotension    Peripheral neuropathy    PONV (postoperative nausea and vomiting)    Restrictive lung disease    Moderate to severe   Sarcoidosis    Biopsy proven - [facility]   Sarcoidosis    SOB (shortness of breath)    chronic   Past Surgical History:  Procedure Laterality Date   BIOPSY  10/17/2018   Procedure: BIOPSY;  Surgeon: Golda Claudis PENNER, MD;  [facility];  Service: Endoscopy;;  duodenum, antrum, gastric body   BIOPSY  03/03/2021   Procedure: BIOPSY;  Surgeon: Golda Claudis PENNER, MD;  [facility];  Service: Endoscopy;;   BIOPSY  09/11/2023   Procedure: BIOPSY;  Surgeon: Shaaron Lamar HERO, MD;  [facility];  Service: Endoscopy;;   BREAST LUMPECTOMY Bilateral 01/12/2011   CHOLECYSTECTOMY N/A 04/17/2021   Procedure: LAPAROSCOPIC CHOLECYSTECTOMY;  Surgeon: Mavis Anes, MD;  Location: [facility];  Service: General;  Laterality: N/A;   COLONOSCOPY WITH PROPOFOL  N/A 01/03/2022   Procedure: COLONOSCOPY WITH PROPOFOL ;  Surgeon: Golda Claudis PENNER, MD;  [facility];  Service: Endoscopy;  Laterality: N/A;  220   ESOPHAGEAL DILATION N/A 03/03/2021   Procedure: ESOPHAGEAL DILATION;  Surgeon: Golda Claudis PENNER, MD;  [facility];  Service: Endoscopy;  Laterality: N/A;   ESOPHAGOGASTRODUODENOSCOPY (EGD) WITH PROPOFOL   N/A 10/17/2018   Procedure: ESOPHAGOGASTRODUODENOSCOPY (EGD) WITH PROPOFOL ;  Surgeon: Golda Claudis PENNER, MD;  [facility];  Service: Endoscopy;  Laterality: N/A;  2:25   ESOPHAGOGASTRODUODENOSCOPY (EGD) WITH PROPOFOL  N/A 03/03/2021   Procedure: ESOPHAGOGASTRODUODENOSCOPY (EGD) WITH PROPOFOL ;  Surgeon: Golda Claudis PENNER, MD;  [facility];  Service: Endoscopy;  Laterality: N/A;  12:15   ESOPHAGOGASTRODUODENOSCOPY (EGD) WITH PROPOFOL  N/A 09/11/2023   Procedure: ESOPHAGOGASTRODUODENOSCOPY (EGD) WITH PROPOFOL ;  Surgeon: Shaaron Lamar HERO, MD;  [facility];  Service: Endoscopy;  Laterality: N/A;  2:30 pm, asa 3, LM to see if pt can come earlier   [facility] DILATION N/A 09/11/2023   Procedure: AGAPITO DILATION;  Surgeon: Shaaron Lamar HERO, MD;  [facility];  Service: Endoscopy;  Laterality: N/A;   ORIF ANKLE FRACTURE Left 03/10/2023   Procedure: OPEN REDUCTION INTERNAL FIXATION (ORIF) ANKLE FRACTURE;  Surgeon: Georgina Ozell LABOR, MD;  Location: [facility];  Service: Orthopedics;  Laterality: Left;   POLYPECTOMY  01/03/2022   Procedure: POLYPECTOMY INTESTINAL;  Surgeon: Golda Claudis PENNER, MD;  [facility];  Service: Endoscopy;;   RIGHT HEART CATH N/A  05/15/2024   Procedure: RIGHT HEART CATH;  Surgeon: Zenaida Morene PARAS, MD;  Location: [facility] INVASIVE CV LAB;  Service: Cardiovascular;  Laterality: N/A;   RIGHT HEART CATH N/A 05/18/2024   Procedure: RIGHT HEART CATH;  Surgeon: Rolan Ezra RAMAN, MD;  Location: [facility] INVASIVE CV LAB;  Service: Cardiovascular;  Laterality: N/A;   TUBAL LIGATION     Social History   Socioeconomic History   Marital status: Married    Spouse name: Not on file   Number of children: 3   Years of education: Not on file   Highest education level: Not on file  Occupational History   Occupation: Midwife Payable    Employer: [employer]  Tobacco Use   Smoking status: Never    Passive exposure: Never   Smokeless tobacco: Never  Vaping Use    Vaping status: Never Used  Substance and Sexual Activity   Alcohol use: No   Drug use: No   Sexual activity: Yes    Birth control/protection: Post-menopausal  Other Topics Concern   Not on file  Social History Narrative   Daily caffeine    Living with husband   Right handed   She has been on disability since 2014.    Social Drivers of Health   Tobacco Use: Low Risk (09/24/2024)   Patient History    Smoking Tobacco Use: Never    Smokeless Tobacco Use: Never    Passive Exposure: Never  Financial Resource Strain: Low Risk (09/27/2023)   Overall Financial Resource Strain (CARDIA)    Difficulty of Paying Living Expenses: Not hard at all  Food Insecurity: No Food Insecurity (09/23/2024)   Epic    Worried About Programme Researcher, Broadcasting/film/video in the Last Year: Never true    Ran Out of Food in the Last Year: Never true  Transportation Needs: No Transportation Needs (09/23/2024)   Epic    Lack of Transportation (Medical): No    Lack of Transportation (Non-Medical): No  Physical Activity: Inactive (09/27/2023)   Exercise Vital Sign    Days of Exercise per Week: 0 days    Minutes of Exercise per Session: 0 min  Stress: No Stress Concern Present (09/27/2023)   [employer] of Occupational Health - Occupational Stress Questionnaire    Feeling of Stress : Only a little  Social Connections: Socially Integrated (01/21/2024)   Social Connection and Isolation Panel    Frequency of Communication with Friends and Family: More than three times a week    Frequency of Social Gatherings with Friends and Family: More than three times a week    Attends Religious Services: More than 4 times per year    Active Member of Clubs or Organizations: Yes    Attends Banker Meetings: More than 4 times per year    Marital Status: Married  Depression (PHQ2-9): Low Risk (09/23/2024)   Depression (PHQ2-9)    PHQ-2 Score: 0  Alcohol Screen: Low Risk (09/27/2023)   Alcohol Screen    Last Alcohol Screening  Score (AUDIT): 0  Housing: Low Risk (09/23/2024)   Epic    Unable to Pay for Housing in the Last Year: No    Number of Times Moved in the Last Year: 0    Homeless in the Last Year: No  Utilities: Not At Risk (09/23/2024)   Epic    Threatened with loss of utilities: No  Health Literacy: Medium Risk (12/20/2023)   Received from [facility] Literacy  How often do you need to have someone help you when you read instructions, pamphlets, or other written material from your doctor or pharmacy?: Sometimes   Family History  Problem Relation Age of Onset   Cardiomyopathy Mother    Breast cancer Mother    Prostate cancer Father    Heart attack Maternal Grandmother    Heart attack Maternal Grandfather    Heart attack Paternal Grandmother    Bipolar disorder Daughter    Heart disease Maternal Aunt    Neurofibromatosis Cousin    Colon cancer Neg Hx    Outpatient Encounter Medications as of 09/24/2024  Medication Sig   doxycycline  (VIBRA -TABS) 100 MG tablet Take 1 tablet (100 mg total) by mouth 2 (two) times daily. (Patient not taking: Reported on 09/23/2024)   acetaminophen  (TYLENOL ) 325 MG tablet Take 2 tablets (650 mg total) by mouth every 6 (six) hours as needed for mild pain (pain score 1-3) or fever (or Fever >/= 101).   alendronate  (FOSAMAX ) 70 MG tablet Take 1 tablet (70 mg total) by mouth every Saturday. Take with a full glass of water  on an empty stomach.   apixaban  (ELIQUIS ) 5 MG TABS tablet Take 1 tablet (5 mg total) by mouth 2 (two) times daily.   budesonide  (PULMICORT ) 0.5 MG/2ML nebulizer solution Take 0.5 mg by nebulization 2 (two) times daily.   busPIRone  (BUSPAR ) 15 MG tablet Take 1 tablet (15 mg) by mouth 3 times daily.   calcitonin, salmon, (MIACALCIN /FORTICAL) 200 UNIT/ACT nasal spray Place 1 spray into alternate nostrils daily.   Calcium  Citrate 333 MG TABS Take one tablet by mouth 3 (three) times daily with meals.   Capsaicin -Menthol -Methyl Sal (CAPSAICIN -METHYL  SAL-MENTHOL ) 0.025-1-12 % CREA Apply 1 Application topically 3 (three) times daily as needed.   DULoxetine  (CYMBALTA ) 30 MG capsule Take 1 capsule (30 mg total) by mouth daily.   ferrous sulfate  (FEROSUL) 325 (65 FE) MG tablet Take 1 tablet (325 mg total) by mouth once a week.   folic acid  (FOLVITE ) 1 MG tablet Take 1 tablet (1 mg total) by mouth daily.   gabapentin  (NEURONTIN ) 300 MG capsule Take 1 capsule (300 mg total) by mouth 4 (four) times daily.   hydrocortisone  (CORTEF ) 10 MG tablet Take 2 tablets by mouth at 8:00 am and 1 tablet at lunch. (May double dose on sick days x 3-5 days)   HYDROmorphone  (DILAUDID ) 2 MG tablet Take 0.5 tablets (1 mg total) by mouth every 4 (four) hours as needed for severe pain (pain score 7-10). (Patient not taking: Reported on 09/23/2024)   HYDROmorphone  (DILAUDID ) 2 MG tablet Take 1 tablet (2 mg total) by mouth every 4 (four) hours as needed for pain (Patient not taking: Reported on 09/23/2024)   hydrOXYzine  (ATARAX ) 10 MG tablet Take 1 tablet (10 mg total) by mouth 3 (three) times daily as needed for anxiety   levothyroxine  (SYNTHROID ) 50 MCG tablet Take 1 tablet by mouth daily before breakfast.   lidocaine  (LIDODERM ) 5 % Place 1 patch onto the skin daily.   linaclotide  (LINZESS ) 145 MCG CAPS capsule Take 1 capsule (145 mcg total) by mouth daily before breakfast.   LORazepam  (ATIVAN ) 0.5 MG tablet Take 1 tablet (0.5 mg total) by mouth 2 (two) times daily as needed for anxiety. (Patient not taking: Reported on 09/23/2024)   methocarbamol  (ROBAXIN ) 750 MG tablet Take 1 tablet (750 mg total) by mouth 3 (three) times daily.   midodrine  (PROAMATINE ) 10 MG tablet Take 1 tablet (10 mg total) by mouth  3 (three) times daily with meals.   ondansetron  (ZOFRAN -ODT) 8 MG disintegrating tablet Dissolve 1 tablet (8 mg total) by mouth every 8 (eight) hours as needed.   [Paused] oxyCODONE  (ROXICODONE ) 15 MG immediate release tablet Take 1 tablet (15 mg total) by mouth every 4 (four)  hours as needed for pain.   oxyCODONE  (ROXICODONE ) 15 MG immediate release tablet Take 1 tablet (15 mg total) by mouth 4 (four) times daily as needed. Discontinue hydromorphone .   OXYGEN  Inhale 5 L into the lungs continuous.   pantoprazole  (PROTONIX ) 40 MG tablet Take 1 tablet (40 mg total) by mouth 2 (two) times daily.   polyethylene glycol (MIRALAX  / GLYCOLAX ) 17 g packet Take 17 g by mouth 2 (two) times daily.   potassium chloride  (KLOR-CON  M) 10 MEQ tablet Take 1 tablet (10 mEq total) by mouth 2 (two) times daily.   revefenacin  (YUPELRI ) 175 MCG/3ML nebulizer solution Take 3 mLs (175 mcg total) by nebulization daily.   rosuvastatin  (CRESTOR ) 20 MG tablet Take 1 tablet (20 mg total) by mouth daily.   Semaglutide ,0.25 or 0.5MG /DOS, (OZEMPIC , 0.25 OR 0.5 MG/DOSE,) 2 MG/3ML SOPN Inject 0.25 mg into the skin once a week.   senna-docusate (SENOKOT-S) 8.6-50 MG tablet Take 2 tablets by mouth at bedtime.   topiramate  (TOPAMAX ) 50 MG tablet Take 1 tablet (50 mg total) by mouth daily with lunch.   torsemide  (DEMADEX ) 20 MG tablet Take 2 tablets by mouth every morning, may take 1 additional torsemide  at 1 PM when weight gain is more than 2 pounds in 1 day   Vitamin D , Ergocalciferol , (DRISDOL ) 1.25 MG (50000 UNIT) CAPS capsule Take 1 capsule (50,000 Units total) by mouth every Sunday.   [DISCONTINUED] Semaglutide ,0.25 or 0.5MG /DOS, (OZEMPIC , 0.25 OR 0.5 MG/DOSE,) 2 MG/3ML SOPN Inject 0.25 mg into the skin once a week. (Patient not taking: Reported on 09/23/2024)   No facility-administered encounter medications on file as of 09/24/2024.   ALLERGIES: Allergies  Allergen Reactions   Bee Venom Anaphylaxis   Contrast Media [Iodinated Contrast Media] Other (See Comments)    CT chest w/ contrast at OSH followed by SOB resolved w/ single dose steroids, never ENT swelling, intubation or pressors, has had multiple contrasted scans since   Bactrim  [Sulfamethoxazole -Trimethoprim ] Nausea And Vomiting   Codeine  Itching    VACCINATION STATUS: Immunization History  Administered Date(s) Administered   Influenza Whole 06/27/2010, 07/28/2011   Influenza-Unspecified 05/27/2013   Moderna Sars-Covid-2 Vaccination 10/27/2019, 11/27/2019   Pneumococcal Conjugate-13 06/15/2014   Pneumococcal Polysaccharide-23 06/10/2013    HPI Adalena R Wedin is 64 y.o. female who is being seen in follow-up in the management of adrenal insufficiency, hypothyroidism, and her recent diagnosis of osteoporosis after multiple vertebral as well as pelvic compression fractures. CT scan performed on May 27 showed mild wedge compression fracture of L1 superior endplate with approximately 10% height loss.  Chronic compression deformities of L3 and L4 which was also previously noticed on Jan 03, 2024.  CT lumbar spine done on Jan 25, 2024 showed stable superior endplate fractures of L1, L3 and L4.  She did not have recent DEXA scan. She was not offered surgical treatment. During her last visit, options of treatment were discussed with her and she agreed with Prolia initiation.  However for unclear reasons, she remains on alendronate  70 mg weekly.  She does not report side effects, however she has history of GERD.  She was already limited in mobility through a wheelchair.   -She has long and complicated  medical history including sarcoidosis because of which she had multiple years of exposure to steroids. 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    -She is on a stable dose of steroids including hydrocortisone  20 mg p.o. daily in the morning, 10 mg p.o. daily at noon.     She remains on continuous oxygen  supplement via nasal cannula.    She has no new complaints today except that her insurance did not cover GLP-1 receptor agonist for weight control.  She is asking for Topamax  to help her with his effort.    For her hypothyroidism, she is on levothyroxine  50 mcg p.o. daily before breakfast.   -She  has significant cognitive deficit.  - The details of her diagnosis with adrenal insufficiency  is not available to review. -She is not wearing her medic alert today.    -She denies family history of adrenal, pituitary dysfunction/syndromes. She has family history of hypothyroidism in one sibling. - Denies any history of intra-abdominal injury. She is on oxycodone  15 mg p.o. every 4 hours for chronic pain control .   Her recent labs show improved calcium  level at 9.2 .   Review of Systems Limited as above.  Objective:    BP 116/66   Pulse 88   LMP 05/17/2016   Wt Readings from Last 3 Encounters:  09/01/24 240 lb (108.9 kg)  08/16/24 240 lb (108.9 kg)  08/13/24 240 lb (108.9 kg)    Physical Exam    CMP ( most recent) CMP     Component Value Date/Time   NA 142 09/23/2024 1137   K 4.5 09/23/2024 1137   CL 100 09/23/2024 1137   CO2 27 09/23/2024 1137   GLUCOSE 160 (H) 09/23/2024 1137   GLUCOSE 106 (H) 08/17/2024 0458   BUN 10 09/23/2024 1137   CREATININE 0.90 09/23/2024 1137   CREATININE 0.85 08/26/2014 0831   CALCIUM  9.2 09/23/2024 1137   PROT 6.9 08/16/2024 1642   PROT 5.8 (L) 01/24/2024 1419   ALBUMIN 3.8 08/16/2024 1642   ALBUMIN 3.8 (L) 01/24/2024 1419   AST 19 08/16/2024 1642   ALT 12 08/16/2024 1642   ALKPHOS 93 08/16/2024 1642   BILITOT 0.3 08/16/2024 1642   BILITOT 0.2 01/24/2024 1419   GFRNONAA >60 08/17/2024 0458   GFRAA 92 05/24/2020 1038     Diabetic Labs (most recent): Lab Results  Component Value Date   HGBA1C 5.9 03/17/2024   HGBA1C 6.2 (H) 07/26/2023   HGBA1C 5.4 08/04/2021     Lipid Panel ( most recent) Lipid Panel     Component Value Date/Time   CHOL 115 01/24/2024 1419   TRIG 133 01/24/2024 1419   HDL 55 01/24/2024 1419   CHOLHDL 2.1 01/24/2024 1419   CHOLHDL 4.0 08/26/2014 0831   VLDL 31 08/26/2014 0831   LDLCALC 37 01/24/2024 1419      Lab Results  Component Value Date   TSH 1.060 01/24/2024   TSH 0.879 10/25/2023   TSH  0.661 06/02/2023   TSH 0.449 (L) 04/23/2023   TSH 0.715 09/26/2022   TSH 0.668 03/16/2022   TSH 0.088 (L) 09/14/2021   TSH 0.668 05/08/2021   TSH 0.307 (L) 11/09/2020   TSH 0.548 05/24/2020   FREET4 1.25 01/24/2024   FREET4 1.19 10/25/2023   FREET4 1.02 06/02/2023   FREET4 1.16 04/23/2023   FREET4 1.11 09/26/2022   FREET4 1.08 03/16/2022   FREET4 1.71 09/14/2021   FREET4 1.06 05/08/2021   FREET4 1.32 11/09/2020  FREET4 1.46 05/24/2020      Assessment & Plan:   Severe osteoporosis with fracture complications: She presents with multiple vertebral compression fractures as well as pelvic fracture. She does not have recent DEXA scan.  It is clear that she would benefit from a stronger antiosteoporosis intervention.  This patient is at particularly high risk for recurrent fractures. She would have benefited more from romosozumab, however this patient is high risk for cardiovascular disease and this product is not on formulary with her insurance plan. Anabolic agents including Forteo lack evidence for hip benefit. She remains a better candidate for Prolia as opposed to Fosamax . I discussed this option and she is opting in, will be switched to Prolia soon as it is procured for her.  If her insurance denies coverage, she will stay on Fosamax . I advised her to expect a long treatment duration,  and  I discussed with her the risk of atypical femur rebound fracture from withdrawal of Prolia without appropriate backup and the need for transitioning to bisphosphonates.    I discussed and prescribed calcium  citrate 333 mg 3 times daily with meals.  Given her history of GERD on PPI, she may not absorb calcium  carbonate optimally.  2. Adrenal insufficiency (HCC) -I have reviewed her new set of labs, available records and clinically evaluated this patient. She does have multiple risk factors for adrenal insufficiency  including exposure to high-dose steroids related to her sarcoidosis , as well as  opioid therapy related to diffuse pain.  -She is on a stable dose of hydrocortisone  20 mg p.o. daily at 8 AM and 10 mg p.o. daily at noon.    -I advised her to maintain close follow-up with her pulmonologist in the care of her her  sarcoidosis.   I had a lengthy discussion with her about the need to avoid any further unnecessary escalation on steroid replacement in the interest of avoiding additional secondary causes of osteoporosis. - She is advised to wear a medic alert depicting her need for steroids. She will not be considered for work-up by withdrawing steroids supports,  risk of adrenal crisis.   -She does not have enough support to perform steroid injection at home.  She wishes to have an emergency Solu-Medrol  IV kit which I discussed and prescribed for her. -She is advised to go to ER if she cannot keep her medications down due to intractable vomiting. - She will be evaluated for the need for mineralocorticoid replacement which may help to stabilize her blood pressure.    - I had a long discussion with her regarding steroids sick day rules, risk of unintended weight gain/diabetes and the need for periodic screening with A1c.   3) obesity/prediabetes  Her recent point-of-care A1c was 5.9% improving from 6.2%,  still consistent with prediabetes.    High risk for type 2 diabetes very high relation to her chronic exposure to high-dose steroids.   She stayed on Topamax  50 mg p.o. daily at lunch, advised to continue.  She is also interested to get another prescription for Ozempic , previously did not get coverage.  She informs me that her current insurance will pay for Ozempic .  I discussed and prescribed Ozempic  0.25 mg subcutaneously weekly to advance as she tolerates.     4. Hypothyroidism:  -Her recent labs for thyroid  function are consistent with appropriate replacement.  She is advised to continue levothyroxine  50 mcg p.o. daily before breakfast.     - We discussed about the correct  intake of  her thyroid  hormone, on empty stomach at fasting, with water , separated by at least 30 minutes from breakfast and other medications,  and separated by more than 4 hours from calcium , iron , multivitamins, acid reflux medications (PPIs). -Patient is made aware of the fact that thyroid  hormone replacement is needed for life, dose to be adjusted by periodic monitoring of thyroid  function tests.    She has severe hyperlipidemia, responded to Crestor  20 mg p.o. nightly.   She is advised to continue same dose.  Side effects and precautions discussed with her.     - I advised her  to maintain close follow up with Alphonsa Glendia LABOR, MD for primary care needs.   I spent  26  minutes in the care of the patient today including review of labs from Thyroid  Function, CMP, and other relevant labs ; imaging/biopsy records (current and previous including abstractions from other facilities); face-to-face time discussing  her lab results and symptoms, medications doses, her options of short and long term treatment based on the latest standards of care / guidelines;   and documenting the encounter.  Madelin JONELLE Collum  participated in the discussions, expressed understanding, and voiced agreement with the above plans.  All questions were answered to her satisfaction. she is encouraged to contact clinic should she have any questions or concerns prior to her return visit.  Dear Patient: Feel free to review your progress notes.  If you are reviewing this progress note and have questions about the meaning of /or medical terms being used, please make a note and address it at your next follow-up appointment.  Medical notes are meant to be a communication tool between medical professionals and require medical terms to be used for efficiency and insurance approval.   Follow up plan: Return in about 6 months (around 03/24/2025) for Prolia(Jubbonti) Today (ASAP) and P(J) NV.   Ranny Earl, MD [facility] [address] [place], [place] [address] Phone: [phone]  Fax: [phone]     09/24/2024, 10:54 AM  This note was partially dictated with voice recognition software. Similar sounding words can be transcribed inadequately or may not  be corrected upon review.  "

## 2024-09-25 ENCOUNTER — Emergency Department (HOSPITAL_COMMUNITY)

## 2024-09-25 ENCOUNTER — Other Ambulatory Visit: Payer: Self-pay

## 2024-09-25 ENCOUNTER — Observation Stay (HOSPITAL_COMMUNITY)
Admission: EM | Admit: 2024-09-25 | Discharge: 2024-09-28 | DRG: 194 | Disposition: A | Attending: Internal Medicine | Admitting: Internal Medicine

## 2024-09-25 ENCOUNTER — Encounter (HOSPITAL_COMMUNITY): Payer: Self-pay

## 2024-09-25 DIAGNOSIS — E66812 Obesity, class 2: Secondary | ICD-10-CM | POA: Diagnosis present

## 2024-09-25 DIAGNOSIS — I5032 Chronic diastolic (congestive) heart failure: Secondary | ICD-10-CM | POA: Diagnosis present

## 2024-09-25 DIAGNOSIS — J9611 Chronic respiratory failure with hypoxia: Secondary | ICD-10-CM | POA: Diagnosis present

## 2024-09-25 DIAGNOSIS — Z7901 Long term (current) use of anticoagulants: Secondary | ICD-10-CM

## 2024-09-25 DIAGNOSIS — Z7951 Long term (current) use of inhaled steroids: Secondary | ICD-10-CM

## 2024-09-25 DIAGNOSIS — G629 Polyneuropathy, unspecified: Secondary | ICD-10-CM | POA: Diagnosis present

## 2024-09-25 DIAGNOSIS — R0602 Shortness of breath: Secondary | ICD-10-CM

## 2024-09-25 DIAGNOSIS — Z7983 Long term (current) use of bisphosphonates: Secondary | ICD-10-CM

## 2024-09-25 DIAGNOSIS — F419 Anxiety disorder, unspecified: Secondary | ICD-10-CM | POA: Diagnosis present

## 2024-09-25 DIAGNOSIS — Z9981 Dependence on supplemental oxygen: Secondary | ICD-10-CM

## 2024-09-25 DIAGNOSIS — G4733 Obstructive sleep apnea (adult) (pediatric): Secondary | ICD-10-CM | POA: Diagnosis present

## 2024-09-25 DIAGNOSIS — M8000XA Age-related osteoporosis with current pathological fracture, unspecified site, initial encounter for fracture: Secondary | ICD-10-CM | POA: Diagnosis present

## 2024-09-25 DIAGNOSIS — J849 Interstitial pulmonary disease, unspecified: Secondary | ICD-10-CM | POA: Diagnosis present

## 2024-09-25 DIAGNOSIS — F32A Depression, unspecified: Secondary | ICD-10-CM | POA: Diagnosis present

## 2024-09-25 DIAGNOSIS — R079 Chest pain, unspecified: Principal | ICD-10-CM

## 2024-09-25 DIAGNOSIS — Z91041 Radiographic dye allergy status: Secondary | ICD-10-CM

## 2024-09-25 DIAGNOSIS — K219 Gastro-esophageal reflux disease without esophagitis: Secondary | ICD-10-CM | POA: Diagnosis present

## 2024-09-25 DIAGNOSIS — E271 Primary adrenocortical insufficiency: Secondary | ICD-10-CM | POA: Diagnosis present

## 2024-09-25 DIAGNOSIS — Z1152 Encounter for screening for COVID-19: Secondary | ICD-10-CM

## 2024-09-25 DIAGNOSIS — G894 Chronic pain syndrome: Secondary | ICD-10-CM | POA: Diagnosis present

## 2024-09-25 DIAGNOSIS — Z881 Allergy status to other antibiotic agents status: Secondary | ICD-10-CM

## 2024-09-25 DIAGNOSIS — M4850XA Collapsed vertebra, not elsewhere classified, site unspecified, initial encounter for fracture: Secondary | ICD-10-CM | POA: Diagnosis present

## 2024-09-25 DIAGNOSIS — Z803 Family history of malignant neoplasm of breast: Secondary | ICD-10-CM

## 2024-09-25 DIAGNOSIS — M8008XA Age-related osteoporosis with current pathological fracture, vertebra(e), initial encounter for fracture: Secondary | ICD-10-CM | POA: Diagnosis present

## 2024-09-25 DIAGNOSIS — Z91030 Bee allergy status: Secondary | ICD-10-CM

## 2024-09-25 DIAGNOSIS — Z79899 Other long term (current) drug therapy: Secondary | ICD-10-CM

## 2024-09-25 DIAGNOSIS — E039 Hypothyroidism, unspecified: Secondary | ICD-10-CM | POA: Diagnosis present

## 2024-09-25 DIAGNOSIS — Z6835 Body mass index (BMI) 35.0-35.9, adult: Secondary | ICD-10-CM

## 2024-09-25 DIAGNOSIS — Z8249 Family history of ischemic heart disease and other diseases of the circulatory system: Secondary | ICD-10-CM

## 2024-09-25 DIAGNOSIS — Z885 Allergy status to narcotic agent status: Secondary | ICD-10-CM

## 2024-09-25 DIAGNOSIS — J189 Pneumonia, unspecified organism: Principal | ICD-10-CM | POA: Diagnosis present

## 2024-09-25 DIAGNOSIS — Z7985 Long-term (current) use of injectable non-insulin antidiabetic drugs: Secondary | ICD-10-CM

## 2024-09-25 DIAGNOSIS — I480 Paroxysmal atrial fibrillation: Secondary | ICD-10-CM | POA: Diagnosis present

## 2024-09-25 DIAGNOSIS — D869 Sarcoidosis, unspecified: Secondary | ICD-10-CM | POA: Diagnosis present

## 2024-09-25 DIAGNOSIS — M797 Fibromyalgia: Secondary | ICD-10-CM | POA: Diagnosis present

## 2024-09-25 HISTORY — DX: Age-related osteoporosis without current pathological fracture: M81.0

## 2024-09-25 LAB — COMPREHENSIVE METABOLIC PANEL WITH GFR
ALT: 12 U/L (ref 0–44)
AST: 19 U/L (ref 15–41)
Albumin: 3.5 g/dL (ref 3.5–5.0)
Alkaline Phosphatase: 93 U/L (ref 38–126)
Anion gap: 13 (ref 5–15)
BUN: 11 mg/dL (ref 8–23)
CO2: 27 mmol/L (ref 22–32)
Calcium: 8.7 mg/dL — ABNORMAL LOW (ref 8.9–10.3)
Chloride: 101 mmol/L (ref 98–111)
Creatinine, Ser: 0.95 mg/dL (ref 0.44–1.00)
GFR, Estimated: >60 mL/min
Glucose, Bld: 95 mg/dL (ref 70–99)
Potassium: 3.9 mmol/L (ref 3.5–5.1)
Sodium: 141 mmol/L (ref 135–145)
Total Bilirubin: 0.2 mg/dL (ref 0.0–1.2)
Total Protein: 6.3 g/dL — ABNORMAL LOW (ref 6.5–8.1)

## 2024-09-25 LAB — CBC WITH DIFFERENTIAL/PLATELET
Abs Immature Granulocytes: 0.12 10*3/uL — ABNORMAL HIGH (ref 0.00–0.07)
Basophils Absolute: 0.1 10*3/uL (ref 0.0–0.1)
Basophils Relative: 1 %
Eosinophils Absolute: 0.5 10*3/uL (ref 0.0–0.5)
Eosinophils Relative: 6 %
HCT: 29.9 % — ABNORMAL LOW (ref 36.0–46.0)
Hemoglobin: 9.1 g/dL — ABNORMAL LOW (ref 12.0–15.0)
Immature Granulocytes: 2 %
Lymphocytes Relative: 15 %
Lymphs Abs: 1.2 10*3/uL (ref 0.7–4.0)
MCH: 28.2 pg (ref 26.0–34.0)
MCHC: 30.4 g/dL (ref 30.0–36.0)
MCV: 92.6 fL (ref 80.0–100.0)
Monocytes Absolute: 0.9 10*3/uL (ref 0.1–1.0)
Monocytes Relative: 11 %
Neutro Abs: 5.4 10*3/uL (ref 1.7–7.7)
Neutrophils Relative %: 65 %
Platelets: 379 10*3/uL (ref 150–400)
RBC: 3.23 MIL/uL — ABNORMAL LOW (ref 3.87–5.11)
RDW: 18 % — ABNORMAL HIGH (ref 11.5–15.5)
WBC: 8.1 10*3/uL (ref 4.0–10.5)
nRBC: 0 % (ref 0.0–0.2)

## 2024-09-25 LAB — RESP PANEL BY RT-PCR (RSV, FLU A&B, COVID)  RVPGX2
Influenza A by PCR: NEGATIVE
Influenza B by PCR: NEGATIVE
Resp Syncytial Virus by PCR: NEGATIVE
SARS Coronavirus 2 by RT PCR: NEGATIVE

## 2024-09-25 LAB — TROPONIN T, HIGH SENSITIVITY
Troponin T High Sensitivity: 11 ng/L (ref 0–19)
Troponin T High Sensitivity: 12 ng/L (ref 0–19)

## 2024-09-25 LAB — PRO BRAIN NATRIURETIC PEPTIDE: Pro Brain Natriuretic Peptide: 65.9 pg/mL

## 2024-09-25 MED ORDER — SODIUM CHLORIDE 0.9 % IV BOLUS
500.0000 mL | Freq: Once | INTRAVENOUS | Status: AC
  Administered 2024-09-25: 500 mL via INTRAVENOUS

## 2024-09-25 MED ORDER — IPRATROPIUM-ALBUTEROL 0.5-2.5 (3) MG/3ML IN SOLN
3.0000 mL | Freq: Once | RESPIRATORY_TRACT | Status: AC
  Administered 2024-09-25: 3 mL via RESPIRATORY_TRACT
  Filled 2024-09-25: qty 3

## 2024-09-25 MED ORDER — ONDANSETRON HCL 4 MG/2ML IJ SOLN
4.0000 mg | Freq: Once | INTRAMUSCULAR | Status: AC
  Administered 2024-09-25: 4 mg via INTRAVENOUS
  Filled 2024-09-25: qty 2

## 2024-09-25 MED ORDER — MORPHINE SULFATE (PF) 4 MG/ML IV SOLN
4.0000 mg | Freq: Once | INTRAVENOUS | Status: AC
  Administered 2024-09-25: 4 mg via INTRAVENOUS
  Filled 2024-09-25: qty 1

## 2024-09-25 NOTE — ED Notes (Signed)
 Pt reports she uses a wheel chair to ambulate at home.

## 2024-09-25 NOTE — ED Triage Notes (Signed)
 Pt arrived via [facility] from home c/o worsening SOB and reports uncontrolled pain from multiple compression fractures. Pt presents on 5L nasal cannula at baseline. Pt reports chest pain as well.

## 2024-09-26 ENCOUNTER — Emergency Department (HOSPITAL_COMMUNITY)

## 2024-09-26 ENCOUNTER — Encounter (HOSPITAL_COMMUNITY): Payer: Self-pay | Admitting: Family Medicine

## 2024-09-26 DIAGNOSIS — F32A Depression, unspecified: Secondary | ICD-10-CM

## 2024-09-26 DIAGNOSIS — I5032 Chronic diastolic (congestive) heart failure: Secondary | ICD-10-CM

## 2024-09-26 DIAGNOSIS — E271 Primary adrenocortical insufficiency: Secondary | ICD-10-CM

## 2024-09-26 DIAGNOSIS — S129XXD Fracture of neck, unspecified, subsequent encounter: Secondary | ICD-10-CM

## 2024-09-26 DIAGNOSIS — F419 Anxiety disorder, unspecified: Secondary | ICD-10-CM

## 2024-09-26 DIAGNOSIS — E039 Hypothyroidism, unspecified: Secondary | ICD-10-CM

## 2024-09-26 DIAGNOSIS — J9611 Chronic respiratory failure with hypoxia: Secondary | ICD-10-CM

## 2024-09-26 DIAGNOSIS — J849 Interstitial pulmonary disease, unspecified: Secondary | ICD-10-CM

## 2024-09-26 DIAGNOSIS — G4733 Obstructive sleep apnea (adult) (pediatric): Secondary | ICD-10-CM

## 2024-09-26 DIAGNOSIS — J189 Pneumonia, unspecified organism: Secondary | ICD-10-CM | POA: Diagnosis present

## 2024-09-26 DIAGNOSIS — I480 Paroxysmal atrial fibrillation: Secondary | ICD-10-CM

## 2024-09-26 LAB — BASIC METABOLIC PANEL WITH GFR
Anion gap: 9 (ref 5–15)
BUN: 8 mg/dL (ref 8–23)
CO2: 29 mmol/L (ref 22–32)
Calcium: 8.5 mg/dL — ABNORMAL LOW (ref 8.9–10.3)
Chloride: 104 mmol/L (ref 98–111)
Creatinine, Ser: 0.88 mg/dL (ref 0.44–1.00)
GFR, Estimated: >60 mL/min
Glucose, Bld: 124 mg/dL — ABNORMAL HIGH (ref 70–99)
Potassium: 3.8 mmol/L (ref 3.5–5.1)
Sodium: 142 mmol/L (ref 135–145)

## 2024-09-26 LAB — CBC
HCT: 28.8 % — ABNORMAL LOW (ref 36.0–46.0)
Hemoglobin: 8.9 g/dL — ABNORMAL LOW (ref 12.0–15.0)
MCH: 28 pg (ref 26.0–34.0)
MCHC: 30.9 g/dL (ref 30.0–36.0)
MCV: 90.6 fL (ref 80.0–100.0)
Platelets: 362 10*3/uL (ref 150–400)
RBC: 3.18 MIL/uL — ABNORMAL LOW (ref 3.87–5.11)
RDW: 17.9 % — ABNORMAL HIGH (ref 11.5–15.5)
WBC: 7.5 10*3/uL (ref 4.0–10.5)
nRBC: 0 % (ref 0.0–0.2)

## 2024-09-26 LAB — MAGNESIUM: Magnesium: 2.4 mg/dL (ref 1.7–2.4)

## 2024-09-26 MED ORDER — SENNOSIDES-DOCUSATE SODIUM 8.6-50 MG PO TABS: 1.0000 | ORAL_TABLET | Freq: Every evening | ORAL | Status: DC | PRN

## 2024-09-26 MED ORDER — POTASSIUM CHLORIDE CRYS ER 20 MEQ PO TBCR
20.0000 meq | EXTENDED_RELEASE_TABLET | Freq: Once | ORAL | Status: AC
  Administered 2024-09-26: 20 meq via ORAL
  Filled 2024-09-26: qty 1

## 2024-09-26 MED ORDER — IPRATROPIUM-ALBUTEROL 0.5-2.5 (3) MG/3ML IN SOLN
3.0000 mL | Freq: Four times a day (QID) | RESPIRATORY_TRACT | Status: DC | PRN
  Administered 2024-09-27: 3 mL via RESPIRATORY_TRACT
  Filled 2024-09-26: qty 3

## 2024-09-26 MED ORDER — BUSPIRONE HCL 15 MG PO TABS
15.0000 mg | ORAL_TABLET | Freq: Three times a day (TID) | ORAL | Status: DC
  Administered 2024-09-26 – 2024-09-28 (×7): 15 mg via ORAL
  Filled 2024-09-26 (×6): qty 1
  Filled 2024-09-26: qty 3

## 2024-09-26 MED ORDER — DULOXETINE HCL 30 MG PO CPEP
30.0000 mg | ORAL_CAPSULE | Freq: Every day | ORAL | Status: DC
  Filled 2024-09-26 (×3): qty 1

## 2024-09-26 MED ORDER — TORSEMIDE 20 MG PO TABS
40.0000 mg | ORAL_TABLET | Freq: Every day | ORAL | Status: DC
  Administered 2024-09-26 – 2024-09-28 (×3): 40 mg via ORAL
  Filled 2024-09-26 (×3): qty 2

## 2024-09-26 MED ORDER — HYDROCORTISONE 10 MG PO TABS
10.0000 mg | ORAL_TABLET | Freq: Every day | ORAL | Status: DC
  Administered 2024-09-27: 10 mg via ORAL
  Filled 2024-09-26 (×3): qty 1

## 2024-09-26 MED ORDER — ACETAMINOPHEN 325 MG PO TABS
650.0000 mg | ORAL_TABLET | Freq: Four times a day (QID) | ORAL | Status: DC | PRN
  Administered 2024-09-26 – 2024-09-28 (×3): 650 mg via ORAL
  Filled 2024-09-26 (×3): qty 2

## 2024-09-26 MED ORDER — OXYCODONE HCL 5 MG PO TABS
15.0000 mg | ORAL_TABLET | Freq: Four times a day (QID) | ORAL | Status: DC | PRN
  Administered 2024-09-26 – 2024-09-28 (×5): 15 mg via ORAL
  Filled 2024-09-26 (×6): qty 3

## 2024-09-26 MED ORDER — SODIUM CHLORIDE 0.9% FLUSH
3.0000 mL | Freq: Two times a day (BID) | INTRAVENOUS | Status: DC
  Administered 2024-09-26 – 2024-09-28 (×5): 3 mL via INTRAVENOUS

## 2024-09-26 MED ORDER — PROCHLORPERAZINE EDISYLATE 10 MG/2ML IJ SOLN: 5.0000 mg | Freq: Four times a day (QID) | INTRAMUSCULAR | Status: DC | PRN

## 2024-09-26 MED ORDER — HYDROCORTISONE 20 MG PO TABS
20.0000 mg | ORAL_TABLET | Freq: Every day | ORAL | Status: DC
  Administered 2024-09-26 – 2024-09-28 (×3): 20 mg via ORAL
  Filled 2024-09-26 (×2): qty 1
  Filled 2024-09-26: qty 2
  Filled 2024-09-26: qty 1

## 2024-09-26 MED ORDER — CALCIUM CARBONATE ANTACID 500 MG PO CHEW
1.0000 | CHEWABLE_TABLET | Freq: Three times a day (TID) | ORAL | Status: DC
  Administered 2024-09-26 – 2024-09-28 (×5): 200 mg via ORAL
  Filled 2024-09-26 (×5): qty 1

## 2024-09-26 MED ORDER — HYDROMORPHONE HCL 1 MG/ML IJ SOLN
0.5000 mg | Freq: Once | INTRAMUSCULAR | Status: AC
  Administered 2024-09-26: 0.5 mg via INTRAVENOUS
  Filled 2024-09-26: qty 0.5

## 2024-09-26 MED ORDER — METHOCARBAMOL 1000 MG/10ML IJ SOLN
500.0000 mg | Freq: Four times a day (QID) | INTRAMUSCULAR | Status: DC | PRN
  Administered 2024-09-27 – 2024-09-28 (×2): 500 mg via INTRAVENOUS
  Filled 2024-09-26 (×3): qty 10

## 2024-09-26 MED ORDER — LEVOTHYROXINE SODIUM 50 MCG PO TABS
50.0000 ug | ORAL_TABLET | Freq: Every day | ORAL | Status: DC
  Administered 2024-09-26 – 2024-09-28 (×3): 50 ug via ORAL
  Filled 2024-09-26 (×3): qty 1

## 2024-09-26 MED ORDER — ROSUVASTATIN CALCIUM 20 MG PO TABS
20.0000 mg | ORAL_TABLET | Freq: Every day | ORAL | Status: DC
  Administered 2024-09-26 – 2024-09-28 (×3): 20 mg via ORAL
  Filled 2024-09-26 (×3): qty 1

## 2024-09-26 MED ORDER — APIXABAN 5 MG PO TABS
5.0000 mg | ORAL_TABLET | Freq: Two times a day (BID) | ORAL | Status: DC
  Administered 2024-09-26 – 2024-09-27 (×4): 5 mg via ORAL
  Filled 2024-09-26: qty 2
  Filled 2024-09-26 (×3): qty 1

## 2024-09-26 MED ORDER — MORPHINE SULFATE (PF) 4 MG/ML IV SOLN
4.0000 mg | Freq: Once | INTRAVENOUS | Status: AC
  Administered 2024-09-26: 4 mg via INTRAVENOUS
  Filled 2024-09-26: qty 1

## 2024-09-26 MED ORDER — ACETAMINOPHEN 650 MG RE SUPP: 650.0000 mg | Freq: Four times a day (QID) | RECTAL | Status: DC | PRN

## 2024-09-26 MED ORDER — GUAIFENESIN 100 MG/5ML PO LIQD: 5.0000 mL | ORAL | Status: DC | PRN

## 2024-09-26 MED ORDER — BISACODYL 5 MG PO TBEC: 5.0000 mg | DELAYED_RELEASE_TABLET | Freq: Every day | ORAL | Status: DC | PRN

## 2024-09-26 MED ORDER — HYDROMORPHONE HCL 1 MG/ML IJ SOLN
1.0000 mg | INTRAMUSCULAR | Status: DC | PRN
  Administered 2024-09-26 – 2024-09-27 (×6): 1 mg via INTRAVENOUS
  Filled 2024-09-26 (×6): qty 1

## 2024-09-26 MED ORDER — LEVOFLOXACIN IN D5W 750 MG/150ML IV SOLN
750.0000 mg | INTRAVENOUS | Status: DC
  Administered 2024-09-26 – 2024-09-28 (×3): 750 mg via INTRAVENOUS
  Filled 2024-09-26 (×3): qty 150

## 2024-09-26 MED ORDER — LORAZEPAM 1 MG PO TABS
1.0000 mg | ORAL_TABLET | Freq: Two times a day (BID) | ORAL | Status: DC | PRN
  Administered 2024-09-26 – 2024-09-27 (×4): 1 mg via ORAL
  Filled 2024-09-26 (×4): qty 1

## 2024-09-26 NOTE — Progress Notes (Signed)
 Patient with PMH of ILD was placed in OBS for PNA, Inpatient Care Management (ICM) conducted chart review and spoke with patient's spouse - Cheryl via bedside phone to complete brief assessment.   CM confirmed Patient lives with Spouse in 1-lvl home with ramped front entrance, still drives sometimes, is fairly independent with baseline reliance on RW for mobility, also has a rollator, shower chair, and wheelchair, and spouse confirmed Patient currently open with [facility]   CM notified agency liaison - Darleene, sent referral & granted agency chart access.  Spouse endorsed large family network that live all around us ... Mom across the street, Sister next door...   Patient with no immediate ICM needs identified at this time. However, IPCM team will continue to follow along and monitor patient advancement through interdisciplinary progression rounds. If patient transition needs arise, please place a TOC consult to prompt IPCM follow-up.    09/26/24 1424  TOC Brief Assessment  Insurance and Status Reviewed  Patient has primary care physician Yes  Home environment has been reviewed Home with Spouse  Prior level of function: Independent with baseline reliance on walker for ambulation  Prior/Current Home Services Current home services  Social Drivers of Health Review SDOH reviewed no interventions necessary  Readmission risk has been reviewed Yes  Transition of care needs transition of care needs identified, TOC will continue to follow

## 2024-09-26 NOTE — ED Notes (Signed)
 Pt alert and oriented reporting that she needs 5L [place] at all times. Pt was noted at 7 when writer entered room. Writer decreased [place] to ordered amount. Spouse is at bedside. Pt unreceptive to any patient education. Pt keeps eyes closed, and perseverates over ADL insisting she is dependent on assistance and unable to stand, pivot, ambulate, or move limbs. Pt has requested commode, assistance to commode because I can't walk, Help me stand, get me a breathing tx, get me ativan  IV now,..... pt continues to remain aggressive with all requests. Johnmark RN has been compassionate and firm with pt encouragement. Pt is reluctant but can follow all and every command including standing and pivoting to commode independently. Pt continues to dismiss any and all therapeutic communication. Pt spouse observed enabling pt behavior. All pt needs have been met at this time with utmost professionalism and compassion. Pt now states get a breathing treatment, and IV ativan . Dr. Darci notified of pt behavior and immediate requests.

## 2024-09-26 NOTE — Plan of Care (Signed)
 Courtesy visit- No billing:  Patient is seen and examined today morning. She is admitted for shortness of breath, cough, back pain. She has vertebral compression fractures. Patient asks for pain meds and her anxiety medications. Per RN not getting out of bed. Husband accompanies her.  Plan: Continue IV antibiotics, duonebs, pain control. CT chest report shows worsening lymphadenopathy, multifocal pneumonia. Will plan to discuss with pulmonology regarding CT findings. Continue supplemental O2 to maintain saturation above 92%. Discussed high flow oxygen  of 5L not needed. Encourage out of bed to chair, incentive spirometry.

## 2024-09-26 NOTE — Care Management Obs Status (Signed)
 MEDICARE OBSERVATION STATUS NOTIFICATION   Patient Details  Name: Kathleen [name] MRN: [id number] Date of Birth: Sep 06, 1960   Medicare Observation Status Notification Given:  Yes, reviewed verbally with Spouse GLENWOOD Craze via bedside phone [phone]    Ronnald MARLA Sil, RN 09/26/2024, 2:21 PM

## 2024-09-26 NOTE — ED Provider Notes (Signed)
" °  Physical Exam  BP 122/64   Pulse 87   Temp 98.3 F (36.8 C) (Oral)   Resp (!) 22   Ht 5' 10 (1.778 m)   Wt 108.9 kg   LMP 05/17/2016   SpO2 98%   BMI 34.45 kg/m   Physical Exam Vitals and nursing note reviewed.  Constitutional:      Appearance: She is well-developed.  HENT:     Head: Normocephalic.  Skin:    General: Skin is warm and dry.  Neurological:     Mental Status: She is alert.     Procedures  Procedures  ED Course / MDM    Medical Decision Making Amount and/or Complexity of Data Reviewed Labs: ordered. Radiology: ordered.  Risk Prescription drug management.   Care assumed from Dr. Elnor at shift change.  Patient with history of interstitial lung disease on home oxygen  presenting with chest pain and worsening breathing.  She has recent hospitalizations for pneumonia and this feels similar.  Care was signed out to me awaiting results of his CT scan to rule out pneumonia.  This study has returned and shows enlarged lymph nodes, but no real significant findings.  At this point, patient has been reassessed and states that she is having ongoing pain to the left side of her chest.  She does not feel comfortable with going home given her prior admissions.  I have spoken with the hospitalist who agrees to admit.       Geroldine Berg, MD 09/26/24 [phone]  "

## 2024-09-26 NOTE — Plan of Care (Signed)

## 2024-09-26 NOTE — Progress Notes (Signed)
 Went in to ask patient about using CPAP; patient stated she only uses O2 at home and refused CPAP.

## 2024-09-27 LAB — CBC
HCT: 30.8 % — ABNORMAL LOW (ref 36.0–46.0)
Hemoglobin: 9.6 g/dL — ABNORMAL LOW (ref 12.0–15.0)
MCH: 28.5 pg (ref 26.0–34.0)
MCHC: 31.2 g/dL (ref 30.0–36.0)
MCV: 91.4 fL (ref 80.0–100.0)
Platelets: 386 10*3/uL (ref 150–400)
RBC: 3.37 MIL/uL — ABNORMAL LOW (ref 3.87–5.11)
RDW: 17.6 % — ABNORMAL HIGH (ref 11.5–15.5)
WBC: 7 10*3/uL (ref 4.0–10.5)
nRBC: 0 % (ref 0.0–0.2)

## 2024-09-27 LAB — BASIC METABOLIC PANEL WITH GFR
Anion gap: 10 (ref 5–15)
BUN: 10 mg/dL (ref 8–23)
CO2: 32 mmol/L (ref 22–32)
Calcium: 9.7 mg/dL (ref 8.9–10.3)
Chloride: 102 mmol/L (ref 98–111)
Creatinine, Ser: 0.94 mg/dL (ref 0.44–1.00)
GFR, Estimated: >60 mL/min
Glucose, Bld: 102 mg/dL — ABNORMAL HIGH (ref 70–99)
Potassium: 4.4 mmol/L (ref 3.5–5.1)
Sodium: 144 mmol/L (ref 135–145)

## 2024-09-27 MED ORDER — GUAIFENESIN-DM 100-10 MG/5ML PO SYRP: 5.0000 mL | ORAL_SOLUTION | ORAL | Status: DC | PRN

## 2024-09-27 MED ORDER — GUAIFENESIN ER 600 MG PO TB12
1200.0000 mg | ORAL_TABLET | Freq: Two times a day (BID) | ORAL | Status: DC
  Administered 2024-09-27 – 2024-09-28 (×2): 1200 mg via ORAL
  Filled 2024-09-27 (×2): qty 2

## 2024-09-27 MED ORDER — HYDROMORPHONE HCL 1 MG/ML IJ SOLN
0.5000 mg | INTRAMUSCULAR | Status: DC | PRN
  Administered 2024-09-27 – 2024-09-28 (×3): 0.5 mg via INTRAVENOUS
  Filled 2024-09-27 (×3): qty 0.5

## 2024-09-27 MED ORDER — ONDANSETRON HCL 4 MG/2ML IJ SOLN
4.0000 mg | Freq: Three times a day (TID) | INTRAMUSCULAR | Status: DC | PRN
  Administered 2024-09-28: 4 mg via INTRAVENOUS
  Filled 2024-09-27: qty 2

## 2024-09-27 MED ORDER — BROMPHENIRAMINE-PHENYLEPHRINE 2-5 MG/10ML PO LIQD: 5.0000 mL | ORAL | Status: DC | PRN

## 2024-09-27 NOTE — Plan of Care (Signed)

## 2024-09-28 ENCOUNTER — Telehealth: Payer: Self-pay

## 2024-09-28 MED ORDER — GUAIFENESIN-DM 100-10 MG/5ML PO SYRP: 5.0000 mL | ORAL_SOLUTION | ORAL | 0 refills | Status: AC | PRN

## 2024-09-28 MED ORDER — GUAIFENESIN ER 600 MG PO TB12: 1200.0000 mg | ORAL_TABLET | Freq: Two times a day (BID) | ORAL | 0 refills | Status: AC

## 2024-09-28 MED ORDER — DIPHENHYDRAMINE HCL 12.5 MG/5ML PO LIQD: 12.5000 mg | Freq: Every evening | ORAL | 0 refills | Status: AC | PRN

## 2024-09-28 MED ORDER — CALCIUM CARBONATE ANTACID 500 MG PO CHEW: 1.0000 | CHEWABLE_TABLET | Freq: Two times a day (BID) | ORAL | 0 refills | Status: AC

## 2024-09-28 MED ORDER — LEVOFLOXACIN 750 MG PO TABS: 750.0000 mg | ORAL_TABLET | Freq: Every day | ORAL | 0 refills | Status: AC

## 2024-09-28 NOTE — Telephone Encounter (Signed)
 Pharmacy Patient Advocate Encounter   Received notification from [facility] that prior authorization for Ozempic  is required/requested.  Ozempic Brena is approved exclusively as an adjunct to diet and exercise to improve glycemic  control in adults with type 2 diabetes mellitus. A review of patient's medical chart reveals no  documented diagnosis of type 2 diabetes or an A1C indicative of diabetes. Therefore, they do not  currently meet the criteria for prior authorization of this medication.

## 2024-09-28 NOTE — TOC Transition Note (Signed)
 Transition of Care [facility]) - Discharge Note   Patient Details  Name: Kathleen [name] MRN: [id number] Date of Birth: October 11, 1960  Transition of Care [facility]) CM/SW Contact:  Ronnald MARLA Sil, RN Phone Number: 09/28/2024, 11:57 AM   Clinical Narrative:    Patient discussed during Progression rounds this morning with emphasis on patient's progress toward medical readiness to discharge today, with plan for patient to transition home with [facility] for PT, OT,   CM already confirmed patient's preference for [facility], forwarded DC Summary and HH order to agency via Epic HUB, and notified liaison -  Cory via text of patient's DC.  Patient confirmed spouse will be transporting patient home and she is est with Home O2 thru [employer] and has portable O2 tank for transport.  No additional transition needs identified, however CM team will continue to follow along and assist as appropriate.  Final next level of care: Home w Home Health Services Barriers to Discharge: No Barriers Identified  Patient Goals and CMS Choice Patient states their goals for this hospitalization and ongoing recovery are:: Home with spouse and [facility]  Discharge Plan and Services Additional resources added to the After Visit Summary for      DME Arranged: N/A HH Arranged: PT, OT HH Agency: [facility] Date [facility] Agency Contacted: 09/28/24 Time HH Agency Contacted: 1157 Representative spoke with at [facility] Agency: Darleene via Text  Social Drivers of Health (SDOH) Interventions SDOH Screenings   Food Insecurity: No Food Insecurity (09/26/2024)  Housing: Low Risk (09/26/2024)  Transportation Needs: No Transportation Needs (09/26/2024)  Utilities: Not At Risk (09/26/2024)  Alcohol Screen: Low Risk (09/27/2023)  Depression (PHQ2-9): Low Risk (09/23/2024)  Financial Resource Strain: Low Risk (09/27/2023)  Physical Activity: Inactive (09/27/2023)  Social Connections: Socially Integrated (01/21/2024)  Stress: No Stress Concern Present (09/27/2023)   Tobacco Use: Low Risk (09/26/2024)  Health Literacy: Medium Risk (12/20/2023)   Received from [facility]     Readmission Risk Interventions    08/17/2024    5:43 PM 05/12/2024   11:28 AM 04/27/2024    9:59 AM  Readmission Risk Prevention Plan  Transportation Screening Complete Complete Complete  Medication Review Oceanographer) Complete Complete Complete  PCP or Specialist appointment within 3-5 days of discharge Complete Complete   HRI or Home Care Consult  Complete Complete  SW Recovery Care/Counseling Consult Complete Complete Complete  Palliative Care Screening Not Applicable Not Applicable Not Applicable  Skilled Nursing Facility Not Applicable Not Applicable Not Applicable

## 2024-09-28 NOTE — Plan of Care (Signed)

## 2024-09-30 ENCOUNTER — Encounter: Payer: Self-pay | Admitting: Pharmacy Technician

## 2024-09-30 ENCOUNTER — Other Ambulatory Visit: Payer: Self-pay

## 2024-09-30 ENCOUNTER — Encounter: Payer: Self-pay | Admitting: Oncology

## 2024-09-30 ENCOUNTER — Other Ambulatory Visit (HOSPITAL_COMMUNITY): Payer: Self-pay

## 2024-09-30 ENCOUNTER — Telehealth: Payer: Self-pay

## 2024-09-30 DIAGNOSIS — M8000XA Age-related osteoporosis with current pathological fracture, unspecified site, initial encounter for fracture: Secondary | ICD-10-CM

## 2024-09-30 MED ORDER — JUBBONTI 60 MG/ML ~~LOC~~ SOSY: 60.0000 mg | PREFILLED_SYRINGE | SUBCUTANEOUS | 1 refills | Status: AC

## 2024-09-30 MED ORDER — DENOSUMAB 60 MG/ML ~~LOC~~ SOSY: 60.0000 mg | PREFILLED_SYRINGE | SUBCUTANEOUS | 1 refills | Status: DC

## 2024-09-30 NOTE — Telephone Encounter (Signed)
 Noted.

## 2024-09-30 NOTE — Telephone Encounter (Signed)
 Pharmacy Patient Advocate Encounter  Insurance verification completed.   The patient is insured through [facility] ADVANTAGE/RX ADVANCE.   Per test claim: Jubbonti  is preferred by the insurance.  If suggested medication is appropriate, Please send in a new RX and discontinue this one. If not, please advise as to why it's not appropriate so that we may request a Prior Authorization. Please note, some preferred medications may still require a PA.  If the suggested medications have not been trialed and there are no contraindications to their use, the PA will not be submitted, as it will not be approved.

## 2024-09-30 NOTE — Telephone Encounter (Signed)
 No PA needed. Jubbonti  is covered by their insurance.  Per test claim: The current 180 day co-pay is, $594.04.  No PA needed at this time. This test claim was processed through [facility]- copay amounts may vary at other pharmacies due to pharmacy/plan contracts, or as the patient moves through the different stages of their insurance plan.     **Patient has a high copay.**

## 2024-09-30 NOTE — Telephone Encounter (Signed)
 error

## 2024-09-30 NOTE — Telephone Encounter (Signed)
 Sent Rx in for Jubbonti  60mg  SQ every 6 months to [facility] to be delivered to the office. Prior shara is needed for Jubbonti .

## 2024-09-30 NOTE — Telephone Encounter (Signed)
 Pt needs prior auth for Prolia  60mg .

## 2024-10-01 NOTE — Telephone Encounter (Signed)
 Noted.

## 2024-10-01 NOTE — Patient Instructions (Incomplete)
 Kathleen [name],  Thank you for taking the time for your Medicare Wellness Visit. I appreciate your continued commitment to your health goals. Please review the care plan we discussed, and feel free to reach out if I can assist you further.  Please note that Annual Wellness Visits do not include a physical exam. Some assessments may be limited, especially if the visit was conducted virtually. If needed, we may recommend an in-person follow-up with your provider.  Ongoing Care Seeing your primary care provider every 3 to 6 months helps us  monitor your health and provide consistent, personalized care.   Referrals If a referral was made during today's visit and you haven't received any updates within two weeks, please contact the referred provider directly to check on the status.  Recommended Screenings:  Health Maintenance  Topic Date Due   Zoster (Shingles) Vaccine (1 of 2) Never done   Pneumococcal Vaccine for age over 22 (3 of 3 - PCV20 or PCV21) 06/16/2019   Breast Cancer Screening  09/28/2023   Pap with HPV screening  06/08/2024   Medicare Annual Wellness Visit  09/26/2024   Flu Shot  11/24/2024*   Colon Cancer Screening  01/03/2029   HPV Vaccine (No Doses Required) Completed   Hepatitis C Screening  Completed   HIV Screening  Completed   Hepatitis B Vaccine  Aged Out   Meningitis B Vaccine  Aged Out   DTaP/Tdap/Td vaccine  Discontinued   COVID-19 Vaccine  Discontinued  *Topic was postponed. The date shown is not the original due date.       09/25/2024    7:32 PM  Advanced Directives  Does Patient Have a Medical Advance Directive? No  Would patient like information on creating a medical advance directive? No - Patient declined   Information on Advanced Care Planning can be found at [place]  Secretary of [facility] Advance Health Care Directives Advance Health Care Directives ([email or web address]    Vision: Annual vision screenings are recommended for early detection of glaucoma,  cataracts, and diabetic retinopathy. These exams can also reveal signs of chronic conditions such as diabetes and high blood pressure.  Dental: Annual dental screenings help detect early signs of oral cancer, gum disease, and other conditions linked to overall health, including heart disease and diabetes.  Please see the attached documents for additional preventive care recommendations.

## 2024-10-02 ENCOUNTER — Other Ambulatory Visit (HOSPITAL_COMMUNITY): Payer: Self-pay

## 2024-10-02 ENCOUNTER — Ambulatory Visit: Payer: HMO

## 2024-10-02 ENCOUNTER — Ambulatory Visit (HOSPITAL_COMMUNITY): Admission: RE | Admit: 2024-10-02 | Source: Ambulatory Visit

## 2024-10-02 ENCOUNTER — Other Ambulatory Visit (HOSPITAL_COMMUNITY): Admission: RE | Admit: 2024-10-02 | Source: Ambulatory Visit

## 2024-10-02 ENCOUNTER — Ambulatory Visit: Admitting: Family Medicine

## 2024-10-02 ENCOUNTER — Other Ambulatory Visit: Payer: Self-pay

## 2024-10-02 VITALS — BP 97/62 | HR 96 | Temp 97.9°F | Ht 70.0 in | Wt 230.0 lb

## 2024-10-02 DIAGNOSIS — J189 Pneumonia, unspecified organism: Secondary | ICD-10-CM

## 2024-10-02 DIAGNOSIS — Z79899 Other long term (current) drug therapy: Secondary | ICD-10-CM

## 2024-10-02 DIAGNOSIS — R112 Nausea with vomiting, unspecified: Secondary | ICD-10-CM

## 2024-10-02 LAB — CBC WITH DIFFERENTIAL/PLATELET
Abs Immature Granulocytes: 0.14 10*3/uL — ABNORMAL HIGH (ref 0.00–0.07)
Basophils Absolute: 0.1 10*3/uL (ref 0.0–0.1)
Basophils Relative: 1 %
Eosinophils Absolute: 0.5 10*3/uL (ref 0.0–0.5)
Eosinophils Relative: 4 %
HCT: 35.7 % — ABNORMAL LOW (ref 36.0–46.0)
Hemoglobin: 11.3 g/dL — ABNORMAL LOW (ref 12.0–15.0)
Immature Granulocytes: 1 %
Lymphocytes Relative: 12 %
Lymphs Abs: 1.5 10*3/uL (ref 0.7–4.0)
MCH: 28.2 pg (ref 26.0–34.0)
MCHC: 31.7 g/dL (ref 30.0–36.0)
MCV: 89 fL (ref 80.0–100.0)
Monocytes Absolute: 1.1 10*3/uL — ABNORMAL HIGH (ref 0.1–1.0)
Monocytes Relative: 8 %
Neutro Abs: 9.6 10*3/uL — ABNORMAL HIGH (ref 1.7–7.7)
Neutrophils Relative %: 74 %
Platelets: 479 10*3/uL — ABNORMAL HIGH (ref 150–400)
RBC: 4.01 MIL/uL (ref 3.87–5.11)
RDW: 16.7 % — ABNORMAL HIGH (ref 11.5–15.5)
WBC: 12.9 10*3/uL — ABNORMAL HIGH (ref 4.0–10.5)
nRBC: 0 % (ref 0.0–0.2)

## 2024-10-02 LAB — HEPATIC FUNCTION PANEL
ALT: 12 U/L (ref 0–44)
AST: 21 U/L (ref 15–41)
Albumin: 4.1 g/dL (ref 3.5–5.0)
Alkaline Phosphatase: 102 U/L (ref 38–126)
Bilirubin, Direct: 0.2 mg/dL (ref 0.0–0.2)
Indirect Bilirubin: 0.2 mg/dL — ABNORMAL LOW (ref 0.3–0.9)
Total Bilirubin: 0.3 mg/dL (ref 0.0–1.2)
Total Protein: 7.4 g/dL (ref 6.5–8.1)

## 2024-10-02 LAB — BASIC METABOLIC PANEL WITH GFR
Anion gap: 16 — ABNORMAL HIGH (ref 5–15)
BUN: 9 mg/dL (ref 8–23)
CO2: 24 mmol/L (ref 22–32)
Calcium: 10.1 mg/dL (ref 8.9–10.3)
Chloride: 97 mmol/L — ABNORMAL LOW (ref 98–111)
Creatinine, Ser: 1.07 mg/dL — ABNORMAL HIGH (ref 0.44–1.00)
GFR, Estimated: 59 mL/min — ABNORMAL LOW
Glucose, Bld: 115 mg/dL — ABNORMAL HIGH (ref 70–99)
Potassium: 3.9 mmol/L (ref 3.5–5.1)
Sodium: 136 mmol/L (ref 135–145)

## 2024-10-02 LAB — LIPASE, BLOOD: Lipase: 28 U/L (ref 11–51)

## 2024-10-02 MED ORDER — OXYCODONE HCL 15 MG PO TABS
15.0000 mg | ORAL_TABLET | Freq: Four times a day (QID) | ORAL | 0 refills | Status: AC
  Filled 2024-10-02: qty 120, 30d supply, fill #0
  Filled ????-??-??: fill #0

## 2024-10-02 MED ORDER — PROMETHAZINE HCL 25 MG PO TABS: ORAL_TABLET | ORAL | 2 refills | Status: AC

## 2024-10-02 NOTE — Progress Notes (Unsigned)
" ° °  Subjective:    Patient ID: Kathleen [name], female    DOB: 21-Oct-1960, 64 y.o.   MRN: [id number]  HPI  Room 9  Pt is here for follow up visit  Pt stated no new concerns   Review of Systems     Objective:   Physical Exam  General-in no acute distress Eyes-no discharge Lungs-respiratory rate normal, CTA CV-no murmurs, heart rate controlled Extremities skin warm dry no edema Neuro grossly normal Behavior normal, alert       Assessment & Plan:  Patient is weak Would benefit from home health Would also benefit from a medical social worker consult to see if she qualifies for any type help at home Her husband is the sole caregiver He relates more more is falling on him and it makes it difficult to meet her needs She is on a significant amount of pain medicine but she claims that it does not cause her drowsiness She does not appear to feel well She relates she has been having intermittent vomiting since coming home from the hospital I told her that if the vomiting does not improve over the next 24 hours she needs to go to ER Labs were ordered no toxic findings with the labs Patient was encouraged to take in adequate fluids We will arrange for close follow-up Chest x-ray did not show any ongoing pneumonia  "

## 2024-11-03 ENCOUNTER — Ambulatory Visit: Admitting: Urology

## 2024-11-10 ENCOUNTER — Inpatient Hospital Stay: Attending: Oncology

## 2024-11-17 ENCOUNTER — Inpatient Hospital Stay: Admitting: Oncology

## 2025-03-25 ENCOUNTER — Ambulatory Visit: Admitting: "Endocrinology
